# Patient Record
Sex: Male | Born: 1950 | Race: White | Hispanic: No | Marital: Single | Smoking: Never smoker
Health system: Southern US, Community
[De-identification: ages and names within clinical notes are randomized; demographics above are authoritative.]

## PROBLEM LIST (undated history)

## (undated) DIAGNOSIS — L97919 Non-pressure chronic ulcer of unspecified part of right lower leg with unspecified severity: Secondary | ICD-10-CM

## (undated) DIAGNOSIS — D509 Iron deficiency anemia, unspecified: Secondary | ICD-10-CM

## (undated) DIAGNOSIS — L03119 Cellulitis of unspecified part of limb: Secondary | ICD-10-CM

## (undated) DIAGNOSIS — I83019 Varicose veins of right lower extremity with ulcer of unspecified site: Secondary | ICD-10-CM

## (undated) DIAGNOSIS — D649 Anemia, unspecified: Secondary | ICD-10-CM

## (undated) DIAGNOSIS — I5042 Chronic combined systolic (congestive) and diastolic (congestive) heart failure: Secondary | ICD-10-CM

## (undated) DIAGNOSIS — I482 Chronic atrial fibrillation, unspecified: Secondary | ICD-10-CM

## (undated) DIAGNOSIS — K5909 Other constipation: Secondary | ICD-10-CM

## (undated) DIAGNOSIS — Z7409 Other reduced mobility: Secondary | ICD-10-CM

## (undated) DIAGNOSIS — A4902 Methicillin resistant Staphylococcus aureus infection, unspecified site: Secondary | ICD-10-CM

## (undated) DIAGNOSIS — G4733 Obstructive sleep apnea (adult) (pediatric): Secondary | ICD-10-CM

## (undated) DIAGNOSIS — R011 Cardiac murmur, unspecified: Secondary | ICD-10-CM

## (undated) DIAGNOSIS — N2889 Other specified disorders of kidney and ureter: Secondary | ICD-10-CM

## (undated) DIAGNOSIS — R001 Bradycardia, unspecified: Secondary | ICD-10-CM

## (undated) DIAGNOSIS — F32A Depression, unspecified: Secondary | ICD-10-CM

## (undated) DIAGNOSIS — I83029 Varicose veins of left lower extremity with ulcer of unspecified site: Secondary | ICD-10-CM

## (undated) DIAGNOSIS — M1990 Unspecified osteoarthritis, unspecified site: Secondary | ICD-10-CM

## (undated) DIAGNOSIS — T7840XA Allergy, unspecified, initial encounter: Secondary | ICD-10-CM

## (undated) DIAGNOSIS — G2581 Restless legs syndrome: Secondary | ICD-10-CM

## (undated) DIAGNOSIS — F419 Anxiety disorder, unspecified: Secondary | ICD-10-CM

## (undated) DIAGNOSIS — J309 Allergic rhinitis, unspecified: Secondary | ICD-10-CM

## (undated) DIAGNOSIS — N182 Chronic kidney disease, stage 2 (mild): Secondary | ICD-10-CM

## (undated) DIAGNOSIS — J45909 Unspecified asthma, uncomplicated: Secondary | ICD-10-CM

## (undated) DIAGNOSIS — L97929 Non-pressure chronic ulcer of unspecified part of left lower leg with unspecified severity: Secondary | ICD-10-CM

## (undated) DIAGNOSIS — N183 Chronic kidney disease, stage 3 unspecified: Secondary | ICD-10-CM

## (undated) DIAGNOSIS — I10 Essential (primary) hypertension: Secondary | ICD-10-CM

## (undated) DIAGNOSIS — E1129 Type 2 diabetes mellitus with other diabetic kidney complication: Secondary | ICD-10-CM

## (undated) DIAGNOSIS — E119 Type 2 diabetes mellitus without complications: Secondary | ICD-10-CM

## (undated) HISTORY — DX: Bradycardia, unspecified: R00.1

## (undated) HISTORY — DX: Essential (primary) hypertension: I10

## (undated) HISTORY — DX: Unspecified osteoarthritis, unspecified site: M19.90

## (undated) HISTORY — DX: Anemia, unspecified: D64.9

## (undated) HISTORY — DX: Depression, unspecified: F32.A

## (undated) HISTORY — DX: Anxiety disorder, unspecified: F41.9

## (undated) HISTORY — DX: Cellulitis of unspecified part of limb: L03.119

## (undated) HISTORY — DX: Restless legs syndrome: G25.81

## (undated) HISTORY — DX: Chronic kidney disease, stage 2 (mild): N18.2

## (undated) HISTORY — DX: Other reduced mobility: Z74.09

## (undated) HISTORY — DX: Other constipation: K59.09

## (undated) HISTORY — PX: TRANSTHORACIC ECHOCARDIOGRAM: SHX275

## (undated) HISTORY — DX: Cardiac murmur, unspecified: R01.1

## (undated) HISTORY — DX: Allergy, unspecified, initial encounter: T78.40XA

## (undated) HISTORY — DX: Chronic kidney disease, stage 3 unspecified: N18.30

## (undated) HISTORY — DX: Chronic atrial fibrillation, unspecified: I48.20

## (undated) HISTORY — DX: Varicose veins of left lower extremity with ulcer of unspecified site: L97.929

## (undated) HISTORY — DX: Other specified disorders of kidney and ureter: N28.89

## (undated) HISTORY — DX: Iron deficiency anemia, unspecified: D50.9

## (undated) HISTORY — DX: Allergic rhinitis, unspecified: J30.9

## (undated) HISTORY — DX: Chronic combined systolic (congestive) and diastolic (congestive) heart failure: I50.42

## (undated) HISTORY — DX: Type 2 diabetes mellitus without complications: E11.9

## (undated) HISTORY — DX: Varicose veins of right lower extremity with ulcer of unspecified site: I83.029

## (undated) HISTORY — DX: Obstructive sleep apnea (adult) (pediatric): G47.33

## (undated) HISTORY — DX: Type 2 diabetes mellitus with other diabetic kidney complication: E11.29

## (undated) HISTORY — PX: URETERAL STENT PLACEMENT: SHX822

## (undated) HISTORY — DX: Varicose veins of right lower extremity with ulcer of unspecified site: I83.019

## (undated) HISTORY — DX: Unspecified asthma, uncomplicated: J45.909

## (undated) HISTORY — DX: Varicose veins of right lower extremity with ulcer of unspecified site: L97.919

## (undated) HISTORY — DX: Methicillin resistant Staphylococcus aureus infection, unspecified site: A49.02

## (undated) HISTORY — DX: Morbid (severe) obesity due to excess calories: E66.01

## (undated) HISTORY — DX: Varicose veins of left lower extremity with ulcer of unspecified site: I83.019

## (undated) HISTORY — DX: Non-pressure chronic ulcer of unspecified part of right lower leg with unspecified severity: L97.919

---

## 2001-06-09 ENCOUNTER — Encounter: Admission: RE | Admit: 2001-06-09 | Discharge: 2001-09-07 | Payer: Self-pay | Admitting: Internal Medicine

## 2002-07-26 ENCOUNTER — Ambulatory Visit (HOSPITAL_COMMUNITY): Admission: RE | Admit: 2002-07-26 | Discharge: 2002-07-26 | Payer: Self-pay | Admitting: Internal Medicine

## 2002-07-26 ENCOUNTER — Encounter: Payer: Self-pay | Admitting: Internal Medicine

## 2002-08-02 ENCOUNTER — Ambulatory Visit (HOSPITAL_COMMUNITY): Admission: RE | Admit: 2002-08-02 | Discharge: 2002-08-02 | Payer: Self-pay | Admitting: Internal Medicine

## 2002-08-02 ENCOUNTER — Encounter: Payer: Self-pay | Admitting: Internal Medicine

## 2003-01-31 ENCOUNTER — Ambulatory Visit (HOSPITAL_COMMUNITY): Admission: RE | Admit: 2003-01-31 | Discharge: 2003-01-31 | Payer: Self-pay | Admitting: Internal Medicine

## 2003-01-31 ENCOUNTER — Encounter (INDEPENDENT_AMBULATORY_CARE_PROVIDER_SITE_OTHER): Payer: Self-pay | Admitting: Specialist

## 2004-12-24 ENCOUNTER — Ambulatory Visit: Payer: Self-pay | Admitting: Internal Medicine

## 2005-01-11 ENCOUNTER — Ambulatory Visit: Payer: Self-pay | Admitting: Internal Medicine

## 2005-01-28 ENCOUNTER — Ambulatory Visit: Payer: Self-pay | Admitting: Internal Medicine

## 2005-03-04 ENCOUNTER — Ambulatory Visit: Payer: Self-pay | Admitting: Internal Medicine

## 2005-03-18 ENCOUNTER — Ambulatory Visit: Payer: Self-pay | Admitting: Internal Medicine

## 2005-05-13 ENCOUNTER — Ambulatory Visit: Payer: Self-pay | Admitting: Internal Medicine

## 2005-05-20 ENCOUNTER — Encounter: Admission: RE | Admit: 2005-05-20 | Discharge: 2005-05-20 | Payer: Self-pay | Admitting: Internal Medicine

## 2005-08-12 ENCOUNTER — Ambulatory Visit: Payer: Self-pay | Admitting: Internal Medicine

## 2005-08-14 ENCOUNTER — Ambulatory Visit: Payer: Self-pay | Admitting: Internal Medicine

## 2005-08-19 ENCOUNTER — Ambulatory Visit: Payer: Self-pay

## 2005-08-26 ENCOUNTER — Ambulatory Visit: Payer: Self-pay | Admitting: Internal Medicine

## 2005-09-09 ENCOUNTER — Ambulatory Visit: Payer: Self-pay | Admitting: Internal Medicine

## 2005-11-11 ENCOUNTER — Ambulatory Visit: Payer: Self-pay | Admitting: Internal Medicine

## 2006-02-17 ENCOUNTER — Ambulatory Visit: Payer: Self-pay | Admitting: Internal Medicine

## 2006-03-10 ENCOUNTER — Ambulatory Visit: Payer: Self-pay | Admitting: Internal Medicine

## 2006-03-27 ENCOUNTER — Ambulatory Visit: Payer: Self-pay | Admitting: Internal Medicine

## 2006-04-07 ENCOUNTER — Ambulatory Visit: Payer: Self-pay | Admitting: Internal Medicine

## 2006-08-11 ENCOUNTER — Ambulatory Visit: Payer: Self-pay | Admitting: Internal Medicine

## 2006-08-11 ENCOUNTER — Encounter: Payer: Self-pay | Admitting: Internal Medicine

## 2006-08-11 DIAGNOSIS — J309 Allergic rhinitis, unspecified: Secondary | ICD-10-CM | POA: Insufficient documentation

## 2006-08-11 DIAGNOSIS — I10 Essential (primary) hypertension: Secondary | ICD-10-CM

## 2006-08-11 DIAGNOSIS — E119 Type 2 diabetes mellitus without complications: Secondary | ICD-10-CM

## 2006-08-11 DIAGNOSIS — J45909 Unspecified asthma, uncomplicated: Secondary | ICD-10-CM

## 2006-08-11 DIAGNOSIS — E1165 Type 2 diabetes mellitus with hyperglycemia: Secondary | ICD-10-CM | POA: Insufficient documentation

## 2006-08-11 HISTORY — DX: Unspecified asthma, uncomplicated: J45.909

## 2006-08-11 HISTORY — DX: Essential (primary) hypertension: I10

## 2006-08-11 HISTORY — DX: Type 2 diabetes mellitus without complications: E11.9

## 2006-08-11 HISTORY — DX: Allergic rhinitis, unspecified: J30.9

## 2006-12-08 ENCOUNTER — Ambulatory Visit: Payer: Self-pay | Admitting: Internal Medicine

## 2006-12-08 LAB — CONVERTED CEMR LAB
ALT: 17 units/L (ref 0–40)
AST: 18 units/L (ref 0–37)
Albumin: 3.6 g/dL (ref 3.5–5.2)
Alkaline Phosphatase: 42 units/L (ref 39–117)
BUN: 11 mg/dL (ref 6–23)
Bilirubin, Direct: 0.2 mg/dL (ref 0.0–0.3)
CO2: 32 meq/L (ref 19–32)
Calcium: 8.8 mg/dL (ref 8.4–10.5)
Chloride: 106 meq/L (ref 96–112)
Creatinine, Ser: 1.2 mg/dL (ref 0.4–1.5)
Creatinine,U: 238.9 mg/dL
GFR calc Af Amer: 81 mL/min
GFR calc non Af Amer: 67 mL/min
Glucose, Bld: 150 mg/dL — ABNORMAL HIGH (ref 70–99)
Hgb A1c MFr Bld: 6.2 % — ABNORMAL HIGH (ref 4.6–6.0)
Microalb Creat Ratio: 31 mg/g — ABNORMAL HIGH (ref 0.0–30.0)
Microalb, Ur: 7.4 mg/dL — ABNORMAL HIGH (ref 0.0–1.9)
Potassium: 4.9 meq/L (ref 3.5–5.1)
Sodium: 142 meq/L (ref 135–145)
Total Bilirubin: 0.7 mg/dL (ref 0.3–1.2)
Total Protein: 6.3 g/dL (ref 6.0–8.3)

## 2006-12-12 ENCOUNTER — Ambulatory Visit: Payer: Self-pay | Admitting: Internal Medicine

## 2007-04-20 ENCOUNTER — Ambulatory Visit: Payer: Self-pay | Admitting: Internal Medicine

## 2007-04-20 DIAGNOSIS — E1129 Type 2 diabetes mellitus with other diabetic kidney complication: Secondary | ICD-10-CM

## 2007-04-20 DIAGNOSIS — E1121 Type 2 diabetes mellitus with diabetic nephropathy: Secondary | ICD-10-CM

## 2007-04-20 DIAGNOSIS — N390 Urinary tract infection, site not specified: Secondary | ICD-10-CM

## 2007-04-20 HISTORY — DX: Type 2 diabetes mellitus with other diabetic kidney complication: E11.29

## 2007-04-20 LAB — CONVERTED CEMR LAB
ALT: 17 units/L (ref 0–40)
AST: 19 units/L (ref 0–37)
Albumin: 3.2 g/dL — ABNORMAL LOW (ref 3.5–5.2)
Alkaline Phosphatase: 56 units/L (ref 39–117)
BUN: 10 mg/dL (ref 6–23)
Bilirubin, Direct: 0.1 mg/dL (ref 0.0–0.3)
CO2: 32 meq/L (ref 19–32)
Calcium: 8.6 mg/dL (ref 8.4–10.5)
Chloride: 107 meq/L (ref 96–112)
Cholesterol: 127 mg/dL (ref 0–200)
Creatinine, Ser: 1 mg/dL (ref 0.4–1.5)
GFR calc Af Amer: 99 mL/min
GFR calc non Af Amer: 82 mL/min
Glucose, Bld: 170 mg/dL — ABNORMAL HIGH (ref 70–99)
HDL: 33 mg/dL — ABNORMAL LOW (ref >39.0)
Hgb A1c MFr Bld: 7.3 % — ABNORMAL HIGH (ref 4.6–6.0)
LDL Cholesterol: 77 mg/dL (ref 0–99)
Potassium: 4.4 meq/L (ref 3.5–5.1)
Sodium: 142 meq/L (ref 135–145)
Total Bilirubin: 1.1 mg/dL (ref 0.3–1.2)
Total CHOL/HDL Ratio: 3.8
Total Protein: 6.7 g/dL (ref 6.0–8.3)
Triglycerides: 83 mg/dL (ref 0–149)
VLDL: 17 mg/dL (ref 0–40)

## 2007-06-04 ENCOUNTER — Ambulatory Visit: Payer: Self-pay | Admitting: Internal Medicine

## 2007-07-22 ENCOUNTER — Ambulatory Visit: Payer: Self-pay | Admitting: Internal Medicine

## 2007-07-22 DIAGNOSIS — I872 Venous insufficiency (chronic) (peripheral): Secondary | ICD-10-CM

## 2007-07-28 ENCOUNTER — Ambulatory Visit: Payer: Self-pay | Admitting: Internal Medicine

## 2007-08-17 ENCOUNTER — Ambulatory Visit: Payer: Self-pay | Admitting: Internal Medicine

## 2007-08-18 LAB — CONVERTED CEMR LAB
ALT: 16 units/L (ref 0–53)
AST: 16 units/L (ref 0–37)
Albumin: 3.5 g/dL (ref 3.5–5.2)
Alkaline Phosphatase: 52 units/L (ref 39–117)
BUN: 11 mg/dL (ref 6–23)
Bilirubin, Direct: 0.2 mg/dL (ref 0.0–0.3)
CO2: 33 meq/L — ABNORMAL HIGH (ref 19–32)
Calcium: 8.8 mg/dL (ref 8.4–10.5)
Chloride: 105 meq/L (ref 96–112)
Cholesterol: 123 mg/dL (ref 0–200)
Creatinine, Ser: 1 mg/dL (ref 0.4–1.5)
GFR calc Af Amer: 99 mL/min
GFR calc non Af Amer: 82 mL/min
Glucose, Bld: 173 mg/dL — ABNORMAL HIGH (ref 70–99)
HDL: 27.1 mg/dL — ABNORMAL LOW (ref >39.0)
Hgb A1c MFr Bld: 7.3 % — ABNORMAL HIGH (ref 4.6–6.0)
LDL Cholesterol: 81 mg/dL (ref 0–99)
Potassium: 4.9 meq/L (ref 3.5–5.1)
Sodium: 145 meq/L (ref 135–145)
Total Bilirubin: 0.9 mg/dL (ref 0.3–1.2)
Total CHOL/HDL Ratio: 4.5
Total Protein: 6.2 g/dL (ref 6.0–8.3)
Triglycerides: 73 mg/dL (ref 0–149)
VLDL: 15 mg/dL (ref 0–40)

## 2007-11-09 ENCOUNTER — Ambulatory Visit: Payer: Self-pay | Admitting: Internal Medicine

## 2007-11-16 ENCOUNTER — Ambulatory Visit: Payer: Self-pay | Admitting: Cardiovascular Disease

## 2007-11-23 ENCOUNTER — Encounter: Payer: Self-pay | Admitting: Cardiovascular Disease

## 2007-11-23 ENCOUNTER — Ambulatory Visit: Payer: Self-pay

## 2007-12-14 ENCOUNTER — Ambulatory Visit: Payer: Self-pay | Admitting: Internal Medicine

## 2007-12-14 HISTORY — DX: Morbid (severe) obesity due to excess calories: E66.01

## 2007-12-15 LAB — CONVERTED CEMR LAB
ALT: 22 units/L (ref 0–53)
AST: 20 units/L (ref 0–37)
Albumin: 3.5 g/dL (ref 3.5–5.2)
Alkaline Phosphatase: 60 units/L (ref 39–117)
BUN: 15 mg/dL (ref 6–23)
Bilirubin, Direct: 0.2 mg/dL (ref 0.0–0.3)
CO2: 33 meq/L — ABNORMAL HIGH (ref 19–32)
Calcium: 8.8 mg/dL (ref 8.4–10.5)
Chloride: 97 meq/L (ref 96–112)
Cholesterol: 108 mg/dL (ref 0–200)
Creatinine, Ser: 1.2 mg/dL (ref 0.4–1.5)
GFR calc Af Amer: 81 mL/min
GFR calc non Af Amer: 67 mL/min
Glucose, Bld: 213 mg/dL — ABNORMAL HIGH (ref 70–99)
HDL: 27.8 mg/dL — ABNORMAL LOW (ref >39.0)
Hgb A1c MFr Bld: 8.1 % — ABNORMAL HIGH (ref 4.6–6.0)
LDL Cholesterol: 54 mg/dL (ref 0–99)
Potassium: 4.7 meq/L (ref 3.5–5.1)
Sodium: 139 meq/L (ref 135–145)
Total Bilirubin: 0.7 mg/dL (ref 0.3–1.2)
Total CHOL/HDL Ratio: 3.9
Total Protein: 6.2 g/dL (ref 6.0–8.3)
Triglycerides: 131 mg/dL (ref 0–149)
VLDL: 26 mg/dL (ref 0–40)

## 2007-12-22 ENCOUNTER — Ambulatory Visit: Payer: Self-pay | Admitting: Internal Medicine

## 2008-01-07 ENCOUNTER — Telehealth: Payer: Self-pay | Admitting: Internal Medicine

## 2008-01-25 ENCOUNTER — Ambulatory Visit: Payer: Self-pay | Admitting: Internal Medicine

## 2008-03-14 ENCOUNTER — Ambulatory Visit: Payer: Self-pay | Admitting: Internal Medicine

## 2008-03-14 LAB — CONVERTED CEMR LAB
ALT: 22 units/L (ref 0–53)
Bilirubin, Direct: 0.1 mg/dL (ref 0.0–0.3)
CO2: 33 meq/L — ABNORMAL HIGH (ref 19–32)
Calcium: 8.5 mg/dL (ref 8.4–10.5)
Creatinine, Ser: 1.1 mg/dL (ref 0.4–1.5)
GFR calc Af Amer: 89 mL/min
Glucose, Bld: 189 mg/dL — ABNORMAL HIGH (ref 70–99)
HDL: 28.9 mg/dL — ABNORMAL LOW (ref >39.0)
Hgb A1c MFr Bld: 7.8 % — ABNORMAL HIGH (ref 4.6–6.0)
Sodium: 142 meq/L (ref 135–145)
Total CHOL/HDL Ratio: 4.2
Total Protein: 6.5 g/dL (ref 6.0–8.3)
Triglycerides: 68 mg/dL (ref 0–149)
VLDL: 14 mg/dL (ref 0–40)

## 2008-03-22 ENCOUNTER — Ambulatory Visit: Payer: Self-pay | Admitting: Internal Medicine

## 2008-07-12 ENCOUNTER — Ambulatory Visit: Payer: Self-pay | Admitting: Internal Medicine

## 2008-07-12 LAB — CONVERTED CEMR LAB
ALT: 21 units/L (ref 0–53)
AST: 22 units/L (ref 0–37)
Albumin: 3.4 g/dL — ABNORMAL LOW (ref 3.5–5.2)
Alkaline Phosphatase: 42 units/L (ref 39–117)
BUN: 11 mg/dL (ref 6–23)
CO2: 31 meq/L (ref 19–32)
Chloride: 103 meq/L (ref 96–112)
Cholesterol: 101 mg/dL (ref 0–200)
Creatinine, Ser: 1.1 mg/dL (ref 0.4–1.5)
Glucose, Bld: 178 mg/dL — ABNORMAL HIGH (ref 70–99)
Hgb A1c MFr Bld: 7.5 % — ABNORMAL HIGH (ref 4.6–6.0)
Potassium: 4.2 meq/L (ref 3.5–5.1)
Total Protein: 6.3 g/dL (ref 6.0–8.3)
Triglycerides: 86 mg/dL (ref 0–149)

## 2008-07-19 ENCOUNTER — Ambulatory Visit: Payer: Self-pay | Admitting: Internal Medicine

## 2008-08-17 ENCOUNTER — Ambulatory Visit: Payer: Self-pay | Admitting: Internal Medicine

## 2008-11-04 DIAGNOSIS — I890 Lymphedema, not elsewhere classified: Secondary | ICD-10-CM | POA: Insufficient documentation

## 2008-11-14 ENCOUNTER — Ambulatory Visit: Payer: Self-pay | Admitting: Internal Medicine

## 2008-11-14 LAB — CONVERTED CEMR LAB
ALT: 21 units/L (ref 0–53)
AST: 24 units/L (ref 0–37)
Alkaline Phosphatase: 56 units/L (ref 39–117)
Bilirubin, Direct: 0.1 mg/dL (ref 0.0–0.3)
CO2: 32 meq/L (ref 19–32)
Chloride: 98 meq/L (ref 96–112)
Glucose, Bld: 238 mg/dL — ABNORMAL HIGH (ref 70–99)
Potassium: 4.5 meq/L (ref 3.5–5.1)
Sodium: 137 meq/L (ref 135–145)
Total Bilirubin: 1 mg/dL (ref 0.3–1.2)
Total CHOL/HDL Ratio: 3.7
Total Protein: 6.7 g/dL (ref 6.0–8.3)

## 2008-11-21 ENCOUNTER — Ambulatory Visit: Payer: Self-pay | Admitting: Internal Medicine

## 2008-12-26 ENCOUNTER — Telehealth: Payer: Self-pay | Admitting: Internal Medicine

## 2009-03-20 ENCOUNTER — Ambulatory Visit: Payer: Self-pay | Admitting: Internal Medicine

## 2009-03-20 LAB — CONVERTED CEMR LAB
BUN: 14 mg/dL (ref 6–23)
Bilirubin, Direct: 0.1 mg/dL (ref 0.0–0.3)
CO2: 31 meq/L (ref 19–32)
Chloride: 104 meq/L (ref 96–112)
Cholesterol: 126 mg/dL (ref 0–200)
Hgb A1c MFr Bld: 8.8 % — ABNORMAL HIGH (ref 4.6–6.5)
LDL Cholesterol: 65 mg/dL (ref 0–99)
Potassium: 4.2 meq/L (ref 3.5–5.1)
Total CHOL/HDL Ratio: 4
Total Protein: 6.7 g/dL (ref 6.0–8.3)
VLDL: 31.2 mg/dL (ref 0.0–40.0)

## 2009-04-10 ENCOUNTER — Ambulatory Visit: Payer: Self-pay | Admitting: Internal Medicine

## 2009-07-03 ENCOUNTER — Encounter: Payer: Self-pay | Admitting: Internal Medicine

## 2009-07-18 ENCOUNTER — Ambulatory Visit: Payer: Self-pay | Admitting: Family Medicine

## 2009-08-07 ENCOUNTER — Ambulatory Visit: Payer: Self-pay | Admitting: Internal Medicine

## 2009-08-07 LAB — CONVERTED CEMR LAB
ALT: 21 units/L (ref 0–53)
Chloride: 102 meq/L (ref 96–112)
Cholesterol: 122 mg/dL (ref 0–200)
GFR calc non Af Amer: 66.02 mL/min (ref >60)
HDL: 31.2 mg/dL — ABNORMAL LOW (ref >39.00)
Hgb A1c MFr Bld: 9 % — ABNORMAL HIGH (ref 4.6–6.5)
LDL Cholesterol: 65 mg/dL (ref 0–99)
Potassium: 4.2 meq/L (ref 3.5–5.1)
Sodium: 139 meq/L (ref 135–145)
Total Bilirubin: 0.9 mg/dL (ref 0.3–1.2)
Total Protein: 6.2 g/dL (ref 6.0–8.3)
VLDL: 25.4 mg/dL (ref 0.0–40.0)

## 2009-08-08 ENCOUNTER — Ambulatory Visit: Payer: Self-pay | Admitting: Internal Medicine

## 2009-11-13 ENCOUNTER — Ambulatory Visit: Payer: Self-pay | Admitting: Internal Medicine

## 2009-11-13 LAB — CONVERTED CEMR LAB
Alkaline Phosphatase: 58 units/L (ref 39–117)
BUN: 14 mg/dL (ref 6–23)
Bilirubin, Direct: 0.2 mg/dL (ref 0.0–0.3)
CO2: 30 meq/L (ref 19–32)
Chloride: 100 meq/L (ref 96–112)
Creatinine, Ser: 1.3 mg/dL (ref 0.4–1.5)
Glucose, Bld: 243 mg/dL — ABNORMAL HIGH (ref 70–99)

## 2009-11-20 ENCOUNTER — Ambulatory Visit: Payer: Self-pay | Admitting: Internal Medicine

## 2010-02-12 ENCOUNTER — Ambulatory Visit: Payer: Self-pay | Admitting: Internal Medicine

## 2010-02-12 LAB — CONVERTED CEMR LAB
AST: 23 units/L (ref 0–37)
Alkaline Phosphatase: 47 units/L (ref 39–117)
BUN: 11 mg/dL (ref 6–23)
GFR calc non Af Amer: 60.08 mL/min (ref >60)
Hgb A1c MFr Bld: 9 % — ABNORMAL HIGH (ref 4.6–6.5)
LDL Cholesterol: 63 mg/dL (ref 0–99)
Potassium: 4.1 meq/L (ref 3.5–5.1)
Sodium: 141 meq/L (ref 135–145)
Total Bilirubin: 0.9 mg/dL (ref 0.3–1.2)
Total CHOL/HDL Ratio: 4
VLDL: 30 mg/dL (ref 0.0–40.0)

## 2010-02-19 ENCOUNTER — Ambulatory Visit: Payer: Self-pay | Admitting: Internal Medicine

## 2010-05-21 ENCOUNTER — Ambulatory Visit: Payer: Self-pay | Admitting: Internal Medicine

## 2010-05-21 LAB — CONVERTED CEMR LAB
Albumin: 3.7 g/dL (ref 3.5–5.2)
Alkaline Phosphatase: 50 units/L (ref 39–117)
BUN: 14 mg/dL (ref 6–23)
Calcium: 8.8 mg/dL (ref 8.4–10.5)
GFR calc non Af Amer: 72.79 mL/min (ref >60)
Glucose, Bld: 203 mg/dL — ABNORMAL HIGH (ref 70–99)
HDL: 30.7 mg/dL — ABNORMAL LOW (ref >39.00)
Hgb A1c MFr Bld: 8.5 % — ABNORMAL HIGH (ref 4.6–6.5)
Sodium: 140 meq/L (ref 135–145)
VLDL: 38.6 mg/dL (ref 0.0–40.0)

## 2010-05-28 ENCOUNTER — Ambulatory Visit: Payer: Self-pay | Admitting: Internal Medicine

## 2010-09-10 ENCOUNTER — Ambulatory Visit: Payer: Self-pay | Admitting: Internal Medicine

## 2010-09-10 LAB — CONVERTED CEMR LAB
Albumin: 3.4 g/dL — ABNORMAL LOW (ref 3.5–5.2)
BUN: 12 mg/dL (ref 6–23)
CO2: 31 meq/L (ref 19–32)
Calcium: 9.1 mg/dL (ref 8.4–10.5)
Cholesterol: 117 mg/dL (ref 0–200)
Creatinine, Ser: 1.3 mg/dL (ref 0.4–1.5)
Glucose, Bld: 232 mg/dL — ABNORMAL HIGH (ref 70–99)
HDL: 30.9 mg/dL — ABNORMAL LOW (ref >39.00)
Total Protein: 6.1 g/dL (ref 6.0–8.3)
Triglycerides: 134 mg/dL (ref 0.0–149.0)

## 2010-09-17 ENCOUNTER — Ambulatory Visit: Payer: Self-pay | Admitting: Internal Medicine

## 2010-10-08 ENCOUNTER — Ambulatory Visit: Payer: Self-pay | Admitting: Internal Medicine

## 2010-12-06 NOTE — Assessment & Plan Note (Signed)
Summary: 3 MONTH ROV/NJR   Vital Signs:  Patient profile:   60 year old male Weight:      385 pounds Temp:     97.9 degrees F Pulse rate:   78 / minute Resp:     18 per minute BP sitting:   152 / 74  (left arm) Cuff size:   large  Vitals Entered By: Gladis Riffle, RN (November 20, 2009 8:22 AM)   Primary Care Provider:  Birdie Sons MD   History of Present Illness:  Follow-Up Visit      This is a 60 year old man who presents for Follow-up visit.  The patient denies chest pain, palpitations, dizziness, syncope, edema, SOB, DOE, PND, and orthopnea.  Since the last visit the patient notes no new problems or concerns.  The patient reports taking meds as prescribed.  When questioned about possible medication side effects, the patient notes none.  has tried to improve diet.   All other systems reviewed and were negative   Preventive Screening-Counseling & Management  Alcohol-Tobacco     Alcohol drinks/day: 0     Smoking Status: never  Allergies (verified): No Known Drug Allergies  Comments:  Nurse/Medical Assistant: 3 month rov, labs done--CBGs 175-235 at home  The patient's medications and allergies were reviewed with the patient and were updated in the Medication and Allergy Lists. 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  Past Surgical History: Last updated: 2006-08-30 uretheral stent  Family History: Last updated: 2006-08-30 Father deceased 65 yo Mother deceased 89 yo diabetes SisterDM Brother survived MI 31 yo Sister survived NPH  Social History: Last updated: 08/30/06 Occupation: Single Never Smoked Alcohol use-no Regular exercise-no  Risk Factors: Alcohol Use: 0 (11/20/2009) Exercise: no (07/18/2009)  Risk Factors: Smoking  Status: never (11/20/2009)  Review of Systems       All other systems reviewed and were negative   Physical Exam  General:  Well-developed,well-nourished,in no acute distress; alert,appropriate and cooperative throughout examination  All other systems reviewed and were negative  obese Head:  normocephalic and atraumatic.   Eyes:  pupils equal and pupils round.   Ears:  R ear normal and L ear normal.   Neck:  No deformities, masses, or tenderness noted. Chest Wall:  No deformities, masses, tenderness or gynecomastia noted. Lungs:  normal respiratory effort and no intercostal retractions.   Heart:  normal rate and regular rhythm.   Abdomen:  Bowel sounds positive,abdomen soft and non-tender without masses, organomegaly or hernias noted. morbid obesity Msk:  No deformity or scoliosis noted of thoracic or lumbar spine.   Pulses:  R radial normal and L radial normal.   Extremities:  3+ edema bilaterally without erythema Skin:  turgor normal and color normal.  Psych:  memory intact for recent and remote and good eye contact.     Impression & Recommendations:  Problem # 1:  OBESITY, MORBID (ICD-278.01) this is clearly his most significant medical issue  Problem # 2:  VENOUS INSUFFICIENCY, LEGS (ICD-459.81) this contributes to his edema---most is due to obesity  Problem # 3:  DIABETES MELLITUS, TYPE II (ICD-250.00)  His updated medication list for this problem includes:    Glipizide 5 Mg Tabs (Glipizide) .Marland Kitchen... 1 by mouth once daily    Metformin Hcl 1000 Mg Tabs (Metformin hcl) .Marland Kitchen... Take 1  tablet by mouth twice a day    Aspirin 325 Mg Tabs (Aspirin) ..... Once daily    Lisinopril-hydrochlorothiazide 20-12.5 Mg Tabs (Lisinopril-hydrochlorothiazide) .Marland Kitchen... 2 by mouth once daily    Lantus Solostar 100 Unit/ml Soln (Insulin glargine) .Marland KitchenMarland KitchenMarland KitchenMarland Kitchen 55 units subcutaneously daily  Complete Medication List: 1)  Glipizide 5 Mg Tabs (Glipizide) .Marland Kitchen.. 1 by mouth once daily 2)  Macrobid 100 Mg Caps  (Nitrofurantoin monohyd macro) .... Once weekly 3)  Metoprolol Tartrate 50 Mg Tabs (Metoprolol tartrate) .Marland Kitchen.. 1 by mouth twice daily 4)  Zyrtec 10 Mg Tabs (Cetirizine hcl) .... Once daily 5)  Hytrin 10 Mg Caps (Terazosin hcl) .Marland Kitchen.. 1 by mouth once daily 6)  Diltiazem Hcl Er Beads 360 Mg Cp24 (Diltiazem hcl er beads) .Marland Kitchen.. 1 o once daily 7)  Metformin Hcl 1000 Mg Tabs (Metformin hcl) .... Take 1 tablet by mouth twice a day 8)  Phrenilin 50-325 Mg Tabs (Butalbital-acetaminophen) .... As directed as needed 9)  Aspirin 325 Mg Tabs (Aspirin) .... Once daily 10)  Lisinopril-hydrochlorothiazide 20-12.5 Mg Tabs (Lisinopril-hydrochlorothiazide) .... 2 by mouth once daily 11)  Lantus Solostar 100 Unit/ml Soln (Insulin glargine) .... 55 units subcutaneously daily 12)  Bd U/f Original Pen Needle 29g X 12.73mm Misc (Insulin pen needle) .... Use with lantus once daily 13)  [facility] Tru Trac Test Strips  .... Once daily as directed  Patient Instructions: 1)  Please schedule a follow-up appointment in 3 months. 2)  labs one week prior to visit 3)  lipids---272.4 4)  lfts-995.2 5)  bmet-995.2 6)  A1C-250.02 7)  check your blood sugar every morning for 2 weeks. call me with results---based on results I may change lantus dose 8)     9)  It is important that you exercise regularly at least 20 minutes 5 times a week. If you develop chest pain, have severe difficulty breathing, or feel very tired , stop exercising immediately and seek medical attention. 10)  You need to lose weight. Consider a lower calorie diet and regular exercise.  11)  Check your blood sugars regularly. If your readings are usually above : or below 70 you should contact our office. 12)  It is important that your Diabetic A1c level is checked every 3 months. 13)  See your eye doctor yearly to check for diabetic eye damage.

## 2010-12-06 NOTE — Assessment & Plan Note (Signed)
Summary: 4 MONTH ROV/NJR   Vital Signs:  Patient profile:   60 year old male Weight:      385 pounds BMI:     55.44 Temp:     98.5 degrees F oral Pulse rate:   88 / minute Pulse rhythm:   regular BP sitting:   146 / 82  (left arm) Cuff size:   large  Vitals Entered By: Sydell Axon LPN (September 17, 2010 9:06 AM) CC: 4 Month follow-up, needs a refill on Macrobid   Primary Care Provider:  Birdie Sons MD  CC:  4 Month follow-up and needs a refill on Macrobid.  History of Present Illness:  Follow-Up Visit      This is a 60 year old man who presents for Follow-up visit.  The patient denies chest pain and palpitations.  Since the last visit the patient notes no new problems or concerns.  The patient reports taking meds as prescribed, not monitoring BP, not monitoring blood sugars, dietary noncompliance, and not exercising.  When questioned about possible medication side effects, the patient notes none.   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 1 By Mouth Once Daily 2)  Macrobid 100 Mg Caps (Nitrofurantoin Monohyd Macro) .... Once Weekly 3)  Metoprolol Tartrate 50 Mg Tabs (Metoprolol Tartrate) .Marland Kitchen.. 1 By Mouth Twice Daily 4)  Zyrtec 10 Mg Tabs (Cetirizine Hcl) .... Once Daily 5)  Terazosin Hcl 10 Mg Caps (Terazosin Hcl) .... Take 1 Tablet By Mouth Once A Day 6)  Diltiazem Hcl Er Beads 360 Mg Cp24 (Diltiazem Hcl Er Beads) .... Take 1 Capsule By Mouth Once A Day 7)  Metformin Hcl 1000 Mg Tabs (Metformin Hcl) ....  Take 1 Tablet By Mouth Twice A Day 8)  Phrenilin 50-325 Mg Tabs (Butalbital-Acetaminophen) .... As Directed As Needed 9)  Aspirin 325 Mg  Tabs (Aspirin) .... Once Daily 10)  Lisinopril-Hydrochlorothiazide 20-12.5 Mg  Tabs (Lisinopril-Hydrochlorothiazide) .... 2 By Mouth Once Daily 11)  Lantus Solostar 100 Unit/ml  Soln (Insulin Glargine) .... 65 Units Subcutaneously Daily 12)  Bd U/f Original Pen Needle 29g X 12.71mm  Misc (Insulin Pen Needle) .... Use With Lantus Once Daily 13)  [facility] Tru Trac Test Strips .... Once Daily As Directed  Allergies (verified): No Known Drug Allergies  Physical Exam  General:  morbidly obese male in no acute distress. HEENT exam atraumatic, normocephalic symmetric her muscles are intact. He is wearing glasses. Neck is supple without lymphadenopathy. Chest clear to auscultation without increased work of breathing. Abdominal exam obese, to bowel sounds, soft her extremities there is no clubbing or cyanosis. History post any edema to the midcalf. Neurologic exam is alert and oriented with a broad-based gait.   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 He also complains about the expense of Lantus. Will try and insulin 7030 mix. Side effects discussed. He needs to take insulin with meals now. He voices understanding. The following medications were removed from the medication list:    Lantus Solostar 100 Unit/ml  Soln (Insulin glargine) .Marland KitchenMarland KitchenMarland KitchenMarland Kitchen 65 units subcutaneously daily His updated medication list for this problem includes:    Glipizide 5 Mg Tabs (Glipizide) .Marland Kitchen... 1 by mouth once daily    Metformin Hcl 1000 Mg Tabs (Metformin hcl) .Marland Kitchen... Take 1 tablet by mouth twice a day    Aspirin 325 Mg Tabs (Aspirin) ..... Once daily    Lisinopril-hydrochlorothiazide 20-12.5 Mg Tabs (Lisinopril-hydrochlorothiazide) .Marland Kitchen... 2 by mouth once daily    Humulin 70/30 Pen 70-30 % Susp (Insulin isophane & regular) .Marland KitchenMarland KitchenMarland KitchenMarland Kitchen 30  units subcutaneously with breakfast and 25 units subcutaneously with dinner.  Labs Reviewed: Creat: 1.3 (09/10/2010)     Last Eye Exam: normal (07/03/2009) Reviewed HgBA1c results: 9.0 (09/10/2010)  8.5 (05/21/2010)  Problem # 2:  HYPERTENSION (ICD-401.9) reasonable control given his morbid obesity. His symptoms were clearly improved with aggressive weight loss. This was discussed with the patient. I don't think it's worth adding additional medications at this time. His updated medication list for this problem includes:    Metoprolol Tartrate 50 Mg Tabs (Metoprolol tartrate) .Marland Kitchen... 1 by mouth twice daily    Terazosin Hcl 10 Mg Caps (Terazosin hcl) .Marland Kitchen... Take 1 tablet by mouth once a day    Diltiazem Hcl Er Beads 360 Mg Cp24 (Diltiazem hcl er beads) .Marland Kitchen... Take 1 capsule by mouth once a day    Lisinopril-hydrochlorothiazide 20-12.5 Mg Tabs (Lisinopril-hydrochlorothiazide) .Marland Kitchen... 2 by mouth once daily  BP today: 146/82 Prior BP: 154/84 (05/28/2010)  Prior 10 Yr Risk Heart Disease: Not enough information (08/11/2006)  Labs Reviewed: K+: 5.1 (09/10/2010) Creat: : 1.3 (09/10/2010)   Chol: 117 (09/10/2010)   HDL: 30.90 (09/10/2010)   LDL: 59 (09/10/2010)   TG: 134.0 (09/10/2010)  Problem # 3:  URINARY TRACT INFECTION, ACUTE, RECURRENT (ICD-599.0) no recurrent symptoms. Long-term Macrobid use. Check chest x-ray. no recurrence after macrobid His updated medication list for this problem includes:    Macrobid 100 Mg Caps (Nitrofurantoin monohyd macro) ..... Once weekly  Orders: T-2 View CXR (71020TC)  Complete Medication List: 1)  Glipizide 5 Mg Tabs (Glipizide) .Marland Kitchen.. 1 by mouth once daily 2)  Macrobid 100 Mg Caps (Nitrofurantoin monohyd macro) .... Once weekly 3)  Metoprolol Tartrate 50 Mg Tabs (Metoprolol tartrate) .Marland Kitchen.. 1 by mouth twice daily 4)  Zyrtec 10 Mg Tabs (Cetirizine hcl) .... Once daily 5)  Terazosin Hcl 10 Mg Caps (Terazosin hcl) .... Take 1 tablet by mouth once a day 6)   Diltiazem Hcl Er Beads 360 Mg Cp24 (Diltiazem hcl er beads) .... Take 1 capsule by mouth once a day 7)  Metformin Hcl 1000 Mg Tabs (Metformin hcl) .... Take 1 tablet by mouth twice a day 8)  Phrenilin 50-325 Mg Tabs (Butalbital-acetaminophen) .... As directed as needed 9)  Aspirin 325 Mg Tabs (Aspirin) .... Once daily 10)  Lisinopril-hydrochlorothiazide 20-12.5 Mg Tabs (Lisinopril-hydrochlorothiazide) .... 2 by mouth once daily 11)  Bd U/f Original Pen Needle 29g X 12.39mm Misc (Insulin pen needle) .... Use with lantus once daily 12)  [facility] Tru Trac Test Strips  .... Once daily as directed 13)  Humulin 70/30 Pen 70-30 % Susp (Insulin isophane & regular) .... 30 units subcutaneously with breakfast and 25 units subcutaneously with dinner.  Other Orders: Admin 1st Vaccine (11914) Flu Vaccine 49yrs + (78295)  Patient Instructions: 1)  Please schedule a follow-up appointment in 3 months. 2)  labs one week prior to visit 3)  lipids---272.4 4)  lfts-995.2 5)  bmet-995.2 6)  A1C-250.02 7)     8)  It is important that you exercise regularly at least 20 minutes 5 times a week. If you develop chest pain, have severe difficulty breathing, or feel very tired , stop exercising immediately and seek medical attention. 9)  You need to lose weight. Consider a lower calorie diet and regular exercise.  Prescriptions: HUMULIN 70/30 PEN 70-30 % SUSP (INSULIN ISOPHANE & REGULAR) 30 units subcutaneously with breakfast and 25 units subcutaneously with dinner.  #5 pens x PRN   Entered and Authorized by:   Birdie Sons MD   Signed by:   Birdie Sons MD on 09/17/2010   Method used:   Electronically to        [facility]* (retail)       [address] [place], [place]  [address]       Ph: [phone]       Fax: [phone]   RxID:   [id number]    Orders Added: 1)  Admin 1st Vaccine [90471] 2)  Flu Vaccine 37yrs + [84132] 3)  Est. Patient Level IV [44010] 4)  T-2 View CXR [71020TC]    Current  Allergies (reviewed today): No known allergies   Flu Vaccine Consent Questions     Do you have a history of severe allergic reactions to this vaccine? no    Any prior history of allergic reactions to egg and/or gelatin? no    Do you have a sensitivity to the preservative Thimersol? no    Do you have a past history of Guillan-Barre Syndrome? no    Do you currently have an acute febrile illness? no    Have you ever had a severe reaction to latex? no    Vaccine information given and explained to patient? yes    Are you currently pregnant? no    Lot Number:[id number]   Exp Date:05/04/2011   Site Given  Left Deltoid IM   .lbflu

## 2010-12-06 NOTE — Assessment & Plan Note (Signed)
Summary: 3 month rov/njr   Vital Signs:  Patient profile:   60 year old male Pulse rate:   88 / minute Pulse rhythm:   regular Resp:     14 per minute BP sitting:   150 / 78  (left arm) Cuff size:   large  Vitals Entered By: Gladis Riffle, RN (February 19, 2010 8:03 AM) CC: 3 month rov, labs done--CBGs 197-298, average 230 at home Is Patient Diabetic? Yes Did you bring your meter with you today? No   Primary Care Provider:  Birdie Sons MD  CC:  3 month rov, labs done--CBGs 197-298, and average 230 at home.  History of Present Illness:  Follow-Up Visit      This is a 60 year old man who presents for Follow-up visit.  The patient denies chest pain and palpitations.  Since the last visit the patient notes no new problems or concerns.  The patient reports taking meds as prescribed, not monitoring BP, not monitoring blood sugars, dietary noncompliance, and not exercising.  When questioned about possible medication side effects, the patient notes none.    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 1 By Mouth Once Daily 2)  Macrobid 100 Mg Caps (Nitrofurantoin Monohyd Macro) .... Once Weekly 3)  Metoprolol Tartrate 50 Mg Tabs (Metoprolol Tartrate) .Marland Kitchen.. 1 By Mouth Twice Daily 4)  Zyrtec 10 Mg Tabs (Cetirizine Hcl) .... Once Daily 5)  Hytrin 10 Mg Caps (Terazosin Hcl) .Marland Kitchen.. 1 By Mouth Once Daily 6)  Diltiazem Hcl Er Beads 360 Mg Cp24  (Diltiazem Hcl Er Beads) .Marland Kitchen.. 1 O Once Daily 7)  Metformin Hcl 1000 Mg Tabs (Metformin Hcl) .... Take 1 Tablet By Mouth Twice A Day 8)  Phrenilin 50-325 Mg Tabs (Butalbital-Acetaminophen) .... As Directed As Needed 9)  Aspirin 325 Mg  Tabs (Aspirin) .... Once Daily 10)  Lisinopril-Hydrochlorothiazide 20-12.5 Mg  Tabs (Lisinopril-Hydrochlorothiazide) .... 2 By Mouth Once Daily 11)  Lantus Solostar 100 Unit/ml  Soln (Insulin Glargine) .... 55 Units Subcutaneously Daily 12)  Bd U/f Original Pen Needle 29g X 12.79mm  Misc (Insulin Pen Needle) .... Use With Lantus Once Daily 13)  [facility] Tru Trac Test Strips .... Once Daily As Directed  Allergies (verified): No Known Drug Allergies  Past History:  Past Medical History: Last updated: 04/10/2007 morbid obesity Allergic rhinitis Asthma Diabetes mellitus, type II Hypertension recurrent uti uretheral stent recurrent cellulitis right leg bronchospasm  Past Surgical History: Last updated: August 18, 2006 uretheral stent  Family History: Last updated: 08/18/2006 Father deceased 96 yo Mother deceased 60 yo diabetes SisterDM Brother survived MI 17 yo Sister survived NPH  Social History: Last updated: Aug 18, 2006 Occupation: Single Never Smoked Alcohol use-no Regular exercise-no  Risk Factors: Alcohol Use: 0 (02/19/2010) Exercise: no (07/18/2009)  Risk Factors: Smoking Status: never (02/19/2010)  Review of Systems       All other systems reviewed and were negative   Physical Exam  General:  Well-developed,well-nourished,in no acute distress; alert,appropriate  and cooperative throughout examination  All other systems reviewed and were negative  obese Head:  normocephalic and atraumatic.   Eyes:  pupils equal and pupils round.   Ears:  R ear normal and L ear normal.   Nose:  no external deformity and no external erythema.   Neck:  No deformities, masses, or tenderness noted. Chest Wall:  No deformities, masses, tenderness or  gynecomastia noted. Lungs:  normal respiratory effort and no intercostal retractions.   Abdomen:  Bowel sounds positive,abdomen soft and non-tender without masses, organomegaly or hernias noted. morbid obesity Msk:  No deformity or scoliosis noted of thoracic or lumbar spine.   Pulses:  R radial normal and L radial normal.   Neurologic:  cranial nerves II-XII intact and gait normal.     Impression & Recommendations:  Problem # 1:  OBESITY, MORBID (ICD-278.01)  this is clearly his most significant medical issue discussed weight loss options: he will consider but not interested at this time  Problem # 2:  DIABETES MELLITUS, TYPE II (ICD-250.00) not controlled increase lantus  His updated medication list for this problem includes:    Glipizide 5 Mg Tabs (Glipizide) .Marland Kitchen... 1 by mouth once daily    Metformin Hcl 1000 Mg Tabs (Metformin hcl) .Marland Kitchen... Take 1 tablet by mouth twice a day    Aspirin 325 Mg Tabs (Aspirin) ..... Once daily    Lisinopril-hydrochlorothiazide 20-12.5 Mg Tabs (Lisinopril-hydrochlorothiazide) .Marland Kitchen... 2 by mouth once daily    Lantus Solostar 100 Unit/ml Soln (Insulin glargine) .Marland KitchenMarland KitchenMarland KitchenMarland Kitchen 60 units subcutaneously daily  Labs Reviewed: Creat: 1.3 (02/12/2010)     Last Eye Exam: normal (07/03/2009) Reviewed HgBA1c results: 9.0 (02/12/2010)  9.0 (11/13/2009)  Problem # 3:  ASTHMA (ICD-493.90) no sxs   Problem # 4:  VENOUS INSUFFICIENCY, LEGS (ICD-459.81) related to obesity desperately needs to lose weight.   Problem # 5:  HYPERTENSION (ICD-401.9) related to weight continue current medications  His updated medication list for this problem includes:    Metoprolol Tartrate 50 Mg Tabs (Metoprolol tartrate) .Marland Kitchen... 1 by mouth twice daily    Hytrin 10 Mg Caps (Terazosin hcl) .Marland Kitchen... 1 by mouth once daily    Diltiazem Hcl Er Beads 360 Mg Cp24 (Diltiazem hcl er beads) .Marland Kitchen... 1 o once daily    Lisinopril-hydrochlorothiazide 20-12.5 Mg Tabs (Lisinopril-hydrochlorothiazide) .Marland Kitchen... 2 by  mouth once daily  BP today: 150/78 Prior BP: 152/74 (11/20/2009)  Prior 10 Yr Risk Heart Disease: Not enough information (08/11/2006)  Labs Reviewed: K+: 4.1 (02/12/2010) Creat: : 1.3 (02/12/2010)   Chol: 127 (02/12/2010)   HDL: 33.80 (02/12/2010)   LDL: 63 (02/12/2010)   TG: 150.0 (02/12/2010)  Complete Medication List: 1)  Glipizide 5 Mg Tabs (Glipizide) .Marland Kitchen.. 1 by mouth once daily 2)  Macrobid 100 Mg Caps (Nitrofurantoin monohyd macro) .... Once weekly 3)  Metoprolol Tartrate 50 Mg Tabs (Metoprolol tartrate) .Marland Kitchen.. 1 by mouth twice daily 4)  Zyrtec 10 Mg Tabs (Cetirizine hcl) .... Once daily 5)  Hytrin 10 Mg Caps (Terazosin hcl) .Marland Kitchen.. 1 by mouth once daily 6)  Diltiazem Hcl Er Beads 360 Mg Cp24 (Diltiazem hcl er beads) .Marland Kitchen.. 1 o once daily 7)  Metformin Hcl 1000 Mg Tabs (Metformin hcl) .... Take 1 tablet by mouth twice a day 8)  Phrenilin 50-325 Mg Tabs (Butalbital-acetaminophen) .... As directed as needed 9)  Aspirin 325 Mg Tabs (Aspirin) .... Once daily 10)  Lisinopril-hydrochlorothiazide 20-12.5 Mg Tabs (Lisinopril-hydrochlorothiazide) .... 2 by mouth once daily 11)  Lantus Solostar 100 Unit/ml Soln (Insulin  glargine) .... 60 units subcutaneously daily 12)  Bd U/f Original Pen Needle 29g X 12.17mm Misc (Insulin pen needle) .... Use with lantus once daily 13)  [facility] Tru Trac Test Strips  .... Once daily as directed  Patient Instructions: 1)  Please schedule a follow-up appointment in 3 months. 2)  labs one week prior to visit 3)  lipids---272.4 4)  lfts-995.2 5)  bmet-995.2 6)  A1C-250.02 7)

## 2010-12-06 NOTE — Assessment & Plan Note (Signed)
Summary: 3 month fup//ccm   Vital Signs:  Patient profile:   60 year old male Temp:     98.5 degrees F oral Pulse rate:   88 / minute Pulse rhythm:   regular BP sitting:   154 / 84  (left arm) Cuff size:   large  Vitals Entered By: Kern Reap CMA Duncan Dull) (May 28, 2010 8:12 AM)  Contraindications/Deferment of Procedures/Staging:    Test/Procedure: Weight Refused    Reason for deferment: patient declined-cannot calculate BMI  CC: follow-up visit    Primary Care Provider:  Birdie Sons MD  CC:  follow-up visit.  History of Present Illness:  Follow-Up Visit      This is a 60 year old man who presents for Follow-up visit.  The patient denies chest pain and palpitations.  Since the last visit the patient notes no new problems or concerns.  The patient reports taking meds as prescribed, not monitoring BP, not monitoring blood sugars, dietary noncompliance, and not exercising.  When questioned about possible medication side effects, the patient notes none.    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 1 By Mouth Once Daily 2)  Macrobid 100 Mg Caps (Nitrofurantoin Monohyd Macro) .... Once Weekly 3)  Metoprolol Tartrate 50 Mg Tabs (Metoprolol Tartrate) .Marland Kitchen.. 1 By Mouth Twice Daily 4)  Zyrtec 10 Mg Tabs (Cetirizine Hcl) .... Once Daily 5)  Hytrin 10 Mg Caps (Terazosin Hcl) .Marland Kitchen.. 1 By Mouth Once Daily 6)  Diltiazem Hcl Er Beads 360 Mg Cp24 (Diltiazem Hcl Er Beads) .... Take 1 Capsule By Mouth Once A Day 7)  Metformin Hcl 1000 Mg Tabs (Metformin Hcl)  .... Take 1 Tablet By Mouth Twice A Day 8)  Phrenilin 50-325 Mg Tabs (Butalbital-Acetaminophen) .... As Directed As Needed 9)  Aspirin 325 Mg  Tabs (Aspirin) .... Once Daily 10)  Lisinopril-Hydrochlorothiazide 20-12.5 Mg  Tabs (Lisinopril-Hydrochlorothiazide) .... 2 By Mouth Once Daily 11)  Lantus Solostar 100 Unit/ml  Soln (Insulin Glargine) .... 60 Units Subcutaneously Daily 12)  Bd U/f Original Pen Needle 29g X 12.83mm  Misc (Insulin Pen Needle) .... Use With Lantus Once Daily 13)  [facility] Tru Trac Test Strips .... Once Daily As Directed  Allergies (verified): No Known Drug Allergies  Past History:  Past Medical History: Last updated: 04/10/2007 morbid obesity Allergic rhinitis Asthma Diabetes mellitus, type II Hypertension recurrent uti uretheral stent recurrent cellulitis right leg bronchospasm  Past Surgical History: Last updated: Sep 04, 2006 uretheral stent  Family History: Last updated: 09-04-2006 Father deceased 13 yo Mother deceased 26 yo diabetes SisterDM Brother survived MI 43 yo Sister survived NPH  Social History: Last updated: 09/04/06 Occupation: Single Never Smoked Alcohol use-no Regular exercise-no  Risk Factors: Alcohol Use: 0 (02/19/2010) Exercise: no (07/18/2009)  Risk Factors: Smoking Status: never (02/19/2010)  Physical Exam  General:  alert and well-developed.   Head:  normocephalic and atraumatic.   Eyes:  pupils equal and pupils round.   Ears:  R ear normal and L ear normal.   Neck:  No deformities, masses, or tenderness noted. Chest Wall:  No deformities, masses,  tenderness or gynecomastia noted. Lungs:  normal respiratory effort and no intercostal retractions.   Heart:  normal rate and regular rhythm.   Abdomen:  Bowel sounds positive,abdomen soft and non-tender without masses, organomegaly or hernias noted. morbid obesity Msk:  No deformity or scoliosis noted of thoracic or lumbar spine.   Pulses:  R radial normal and L radial  normal.   Extremities:  3+ edema bilaterally without erythema Neurologic:  cranial nerves II-XII intact and gait normal.     Impression & Recommendations:  Problem # 1:  OBESITY, MORBID (ICD-278.01)  this is clearly his most significant medical issue discussed weight loss options: he will consider but not interested at this time  Problem # 2:  DIABETES MELLITUS, TYPE II (ICD-250.00) Assessment: Improved some better desperately needs to lose weight His updated medication list for this problem includes:    Glipizide 5 Mg Tabs (Glipizide) .Marland Kitchen... 1 by mouth once daily    Metformin Hcl 1000 Mg Tabs (Metformin hcl) .Marland Kitchen... Take 1 tablet by mouth twice a day    Aspirin 325 Mg Tabs (Aspirin) ..... Once daily    Lisinopril-hydrochlorothiazide 20-12.5 Mg Tabs (Lisinopril-hydrochlorothiazide) .Marland Kitchen... 2 by mouth once daily    Lantus Solostar 100 Unit/ml Soln (Insulin glargine) .Marland KitchenMarland KitchenMarland KitchenMarland Kitchen 65 units subcutaneously daily  Labs Reviewed: Creat: 1.1 (05/21/2010)     Last Eye Exam: normal (07/03/2009) Reviewed HgBA1c results: 8.5 (05/21/2010)  9.0 (02/12/2010)  Problem # 3:  HYPERTENSION (ICD-401.9) this is related to weight  he is on multiple high dose meds---continue same His updated medication list for this problem includes:    Metoprolol Tartrate 50 Mg Tabs (Metoprolol tartrate) .Marland Kitchen... 1 by mouth twice daily    Hytrin 10 Mg Caps (Terazosin hcl) .Marland Kitchen... 1 by mouth once daily    Diltiazem Hcl Er Beads 360 Mg Cp24 (Diltiazem hcl er beads) .Marland Kitchen... Take 1 capsule by mouth once a day    Lisinopril-hydrochlorothiazide 20-12.5 Mg Tabs (Lisinopril-hydrochlorothiazide) .Marland Kitchen... 2 by mouth once daily  BP today: 154/84 Prior BP: 150/78 (02/19/2010)  Prior 10 Yr Risk Heart Disease: Not enough information (08/11/2006)  Labs Reviewed: K+: 4.4 (05/21/2010) Creat: : 1.1 (05/21/2010)   Chol: 136 (05/21/2010)   HDL: 30.70 (05/21/2010)   LDL: 67 (05/21/2010)   TG: 193.0 (05/21/2010)  Problem # 4:  URINARY TRACT  INFECTION, ACUTE, RECURRENT (ICD-599.0) no recurrent sxs His updated medication list for this problem includes:    Macrobid 100 Mg Caps (Nitrofurantoin monohyd macro) ..... Once weekly  Complete Medication List: 1)  Glipizide 5 Mg Tabs (Glipizide) .Marland Kitchen.. 1 by mouth once daily 2)  Macrobid 100 Mg Caps (Nitrofurantoin monohyd macro) .... Once weekly 3)  Metoprolol Tartrate 50 Mg Tabs (Metoprolol tartrate) .Marland Kitchen.. 1 by mouth twice daily 4)  Zyrtec 10 Mg Tabs (Cetirizine hcl) .... Once daily 5)  Hytrin 10 Mg Caps (Terazosin hcl) .Marland Kitchen.. 1 by mouth once daily 6)  Diltiazem Hcl Er Beads 360 Mg Cp24 (Diltiazem hcl er beads) .... Take 1 capsule by mouth once a day 7)  Metformin Hcl 1000 Mg Tabs (Metformin hcl) .... Take 1 tablet by mouth twice a day 8)  Phrenilin 50-325 Mg Tabs (Butalbital-acetaminophen) .... As directed as needed 9)  Aspirin 325 Mg Tabs (Aspirin) .... Once daily 10)  Lisinopril-hydrochlorothiazide 20-12.5 Mg Tabs (Lisinopril-hydrochlorothiazide) .... 2 by mouth once daily 11)  Lantus Solostar 100 Unit/ml Soln (Insulin glargine) .... 65 units subcutaneously daily 12)  Bd U/f Original Pen Needle 29g X 12.57mm Misc (Insulin pen needle) .... Use with lantus  once daily 13)  [facility]vs Tru Trac Test Strips  .... Once daily as directed  Patient Instructions: 1)  Please schedule a follow-up appointment in 4 months. 2)  labs one week prior to visit 3)  lipids---272.4 4)  lfts-995.2 5)  bmet-995.2 6)  A1C-250.02 7)     Prescriptions: LANTUS SOLOSTAR 100 UNIT/ML  SOLN (INSULIN GLARGINE) 65 units subcutaneously daily  #18 pens x PRN   Entered and Authorized by:   Birdie Sons MD   Signed by:   Birdie Sons MD on 05/28/2010   Method used:   Electronically to        [facility]* (retail)       [address] [place], [place]  [address]       Ph: [phone]       Fax: [phone]   RxID:   [id number] BD U/F ORIGINAL PEN NEEDLE 29G X 12.7MM  MISC (INSULIN PEN NEEDLE) use with lantus  once daily  #100 x 2   Entered by:   Kern Reap CMA (AAMA)   Authorized by:   Birdie Sons MD   Signed by:   Kern Reap CMA (AAMA) on 05/28/2010   Method used:   Electronically to        [facility]* (retail)       [address] [place], [place]  [address]       Ph: [phone]       Fax: [phone]   RxID:   [id number]

## 2010-12-10 ENCOUNTER — Other Ambulatory Visit: Payer: BC Managed Care – PPO | Admitting: Internal Medicine

## 2010-12-10 DIAGNOSIS — T887XXA Unspecified adverse effect of drug or medicament, initial encounter: Secondary | ICD-10-CM

## 2010-12-10 DIAGNOSIS — E785 Hyperlipidemia, unspecified: Secondary | ICD-10-CM

## 2010-12-10 DIAGNOSIS — E119 Type 2 diabetes mellitus without complications: Secondary | ICD-10-CM

## 2010-12-10 LAB — BASIC METABOLIC PANEL
BUN: 16 mg/dL (ref 6–23)
CO2: 30 mEq/L (ref 19–32)
Chloride: 98 mEq/L (ref 96–112)
Glucose, Bld: 203 mg/dL — ABNORMAL HIGH (ref 70–99)
Potassium: 4.6 mEq/L (ref 3.5–5.1)

## 2010-12-10 LAB — HEPATIC FUNCTION PANEL
ALT: 21 U/L (ref 0–53)
Bilirubin, Direct: 0.2 mg/dL (ref 0.0–0.3)
Total Bilirubin: 0.7 mg/dL (ref 0.3–1.2)

## 2010-12-10 LAB — LIPID PANEL
LDL Cholesterol: 70 mg/dL (ref 0–99)
VLDL: 27 mg/dL (ref 0.0–40.0)

## 2010-12-14 ENCOUNTER — Encounter: Payer: Self-pay | Admitting: Internal Medicine

## 2010-12-17 ENCOUNTER — Encounter: Payer: Self-pay | Admitting: Internal Medicine

## 2010-12-17 ENCOUNTER — Ambulatory Visit (INDEPENDENT_AMBULATORY_CARE_PROVIDER_SITE_OTHER): Payer: BC Managed Care – PPO | Admitting: Internal Medicine

## 2010-12-17 VITALS — BP 144/84 | HR 104 | Temp 98.2°F | Ht 70.5 in

## 2010-12-17 DIAGNOSIS — E119 Type 2 diabetes mellitus without complications: Secondary | ICD-10-CM

## 2010-12-17 DIAGNOSIS — I10 Essential (primary) hypertension: Secondary | ICD-10-CM

## 2010-12-17 DIAGNOSIS — Z0000 Encounter for general adult medical examination without abnormal findings: Secondary | ICD-10-CM

## 2010-12-17 MED ORDER — INSULIN PEN NEEDLE 29G X 12MM MISC: Status: DC

## 2010-12-17 NOTE — Assessment & Plan Note (Signed)
This is the patient's primary problem. Discussed need for weight loss. Discussed low calorie diet and regular exercise.

## 2010-12-17 NOTE — Assessment & Plan Note (Signed)
Patient's main problem is morbid obesity. Reviewed his A1c with him. I think he needs more insulin. See new dose. Side effects discussed. Discussed hypoglycemia and its treatment.

## 2010-12-17 NOTE — Progress Notes (Signed)
  Subjective:    Patient ID: [name], male    DOB: 1950-11-08, 60 y.o.   MRN: [id number]  HPI   patient comes in for followup of multiple medical problems including type 2 diabetes, hyperlipidemia, hypertension. The patient does not check his one sugar blood pressure at home. The patetient does not follow an exercise or diet program. The patient denies any polyuria, polydipsia.  In the past the patient has gone to diabetic treatment center. The patient is tolerating medications  Without difficulty. The patient does admit to medication compliance.   Review of Systems  patient denies chest pain, shortness of breath, orthopnea. Denies lower extremity edema, abdominal pain, change in appetite, change in bowel movements. Patient denies rashes, musculoskeletal complaints. No other specific complaints in a complete review of systems.  Once in a while he will have word finding difficulties---no other memory concerns     Objective:   Physical Exam      Well-developed morbidly obese male in no acute distress. HEENT exam atraumatic, normocephalic, extraocular muscles are intact. Neck is supple. No jugular venous distention no thyromegaly. Chest clear to auscultation without increased work of breathing. Cardiac exam S1 and S2 are regular. Abdominal exam active bowel sounds, soft, nontender. Extremities no edema. Neurologic exam she is alert without any motor sensory deficits. Gait is normal.    Assessment & Plan:

## 2010-12-17 NOTE — Assessment & Plan Note (Signed)
Adequate control given morbid obesity. Discussed need for aggressive weight loss.

## 2010-12-27 ENCOUNTER — Telehealth: Payer: Self-pay | Admitting: Internal Medicine

## 2010-12-27 DIAGNOSIS — Z0000 Encounter for general adult medical examination without abnormal findings: Secondary | ICD-10-CM

## 2010-12-27 NOTE — Telephone Encounter (Signed)
ok 

## 2010-12-27 NOTE — Telephone Encounter (Signed)
Triage vm----was set up to have a colonoscopy. However, the patient found that the CT colonoscopy would be more of an advantage for him. Please call [facility] in [place] @ [phone], to authorize. Call pt @ [phone].

## 2010-12-31 NOTE — Telephone Encounter (Signed)
Orders sent to Terri.

## 2011-01-03 ENCOUNTER — Other Ambulatory Visit: Payer: Self-pay | Admitting: Internal Medicine

## 2011-01-03 DIAGNOSIS — Z1211 Encounter for screening for malignant neoplasm of colon: Secondary | ICD-10-CM

## 2011-01-03 HISTORY — PX: OTHER SURGICAL HISTORY: SHX169

## 2011-01-06 ENCOUNTER — Other Ambulatory Visit: Payer: Self-pay | Admitting: Internal Medicine

## 2011-01-22 ENCOUNTER — Ambulatory Visit
Admission: RE | Admit: 2011-01-22 | Discharge: 2011-01-22 | Disposition: A | Discharge status: Home / Self Care | Payer: BC Managed Care – PPO | Source: Ambulatory Visit | Attending: Internal Medicine | Admitting: Internal Medicine

## 2011-01-22 DIAGNOSIS — Z1211 Encounter for screening for malignant neoplasm of colon: Secondary | ICD-10-CM

## 2011-03-19 NOTE — Assessment & Plan Note (Signed)
[facility]                            CARDIOLOGY OFFICE NOTE   [name], [name]                   MRN:          [id number]  DATE:11/16/2007                            DOB:          09-13-1951    [name] is a morbidly obese 60 year old patient referred by Dr.  Cato Mulligan for dyspnea.   The patient has not had a previous cardiac workup.  The patient is over  400 pounds.  He has had a cold over the last week.  He saw Dr. Cato Mulligan on  Monday, he appears to have had a URI, he has not had a fever, he has  been having bit of a cough but no sputum production.  He is not an  asthmatic and does not smoke.   The dyspnea seems functional, he has mild chronic lower extremity edema  but is not on a diuretic.  He has not had any significant chest pain.  There has been no palpitations or syncope.   He is able to walk a short distance, maybe 100 feet, and then gets short  of breath.  This symptom is fairly chronic.  He is very sedentary.  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   There has been no history of DVT.  He had an ultrasound about a year ago  which was negative for DVT.   REVIEW OF SYSTEMS:  Otherwise negative.  His coronary risk factors  include morbid obesity, diabetes, his cholesterol is 77 not on therapy,  and hypertension.   His review of systems otherwise negative.   PAST MEDICAL HISTORY:  Fairly unremarkable.  He has had an urethral  stent, hypertension, lower extremity edema, morbid obesity.  He has not  had other surgery.   The patient is single.  He does not have children.  He works as an Engineer, mining.  He is sedentary.  He has poor dietary compliance and really  does not have a lot of hobbies.  His mother died at the age of 84 of  diabetes.  His father died at age 59 of natural causes.   CURRENT MEDICATIONS:  1. Metformin 1 gram b.i.d.  2. Glipizide 5 a day.  3. Macrobid.  4. Metoprolol 50 b.i.d.  5. Zyrtec 10 a day.  6. Hytrin 10 a day.  7. Lisinopril 40 a day.  8. Aspirin a day.   EXAM:  Remarkable for morbidly obese white male in no distress.  He has  striae along the skin and his shoulders.  Weight is 402, blood pressure  is 140/89, pulse 89, afebrile.  HEENT:  Unremarkable.  Carotids are without bruit; no lymphadenopathy,  thyromegaly, JVP elevation.  He has mild wheezing in the left upper lobe.  Diaphragmatic motion is  good.  S1-S2 with distant heart sounds, PMI not palpable.  ABDOMEN:  Protuberant, benign, bowel sounds positive, no tenderness, no  AAA, no hepatosplenomegaly or hepatojugular reflux.  Distal pulses are intact with +1 to 2 lower extremity edema bilaterally.   EKG is entirely normal.  Reviewed his lab work previously done by Dr. Cato Mulligan June of 2008,  potassium was 4.4, LFTs were normal, hemoglobin A1c is elevated at 7.3,  LDL cholesterol was 77, microalbumin was elevated at 7.4.   IMPRESSION:  1. Dyspnea related to upper respiratory infection and morbid obesity,      normal heart sounds and normal EKG.  I doubt significant pulmonary      hypertension or decreased left ventricular function.  Will check 2D      echocardiogram.  2. Lower extremity edema secondary to obesity, needs to be on a      diuretic.  Will stop his lisinopril and change over to lisinopril      hydrochlorothiazide, he will take two 20/12.5 mg tablet.  He will      get a potassium check with Dr. Cato Mulligan 3 weeks after starting this.      Continue low-salt diet.  3. Morbid obesity, information for both Optifast diet and [facility]      [facility]ariat[facility] will be given, encouraged to see this through.  4. Hypertension, currently borderline controlled.  Diuretic will help      with this.  5. Diabetes, needs tighter control.  Hemoglobin A1c should be in the 6      range, follow quarterly.  Follow-up with Dr. Cato Mulligan.  6. The patient will be seen on a  p.r.n. basis so long as his      echocardiogram shows good left ventricular function and no evidence      of pulmonary hypertension and he will follow up with Dr. Cato Mulligan for      his URI and further follow-up of his complications from morbid      obesity.     Noralyn Pick. Eden Emms, MD, [facility]  Electronically Signed    PCN/MedQ  DD: 11/16/2007  DT: 11/16/2007  Job #: [id number]   cc:   Valetta Mole. Swords, MD

## 2011-04-02 ENCOUNTER — Other Ambulatory Visit: Payer: Self-pay | Admitting: Internal Medicine

## 2011-04-11 ENCOUNTER — Other Ambulatory Visit: Payer: Self-pay | Admitting: Internal Medicine

## 2011-04-15 ENCOUNTER — Other Ambulatory Visit (INDEPENDENT_AMBULATORY_CARE_PROVIDER_SITE_OTHER): Payer: BC Managed Care – PPO

## 2011-04-15 DIAGNOSIS — T887XXA Unspecified adverse effect of drug or medicament, initial encounter: Secondary | ICD-10-CM

## 2011-04-15 DIAGNOSIS — E785 Hyperlipidemia, unspecified: Secondary | ICD-10-CM

## 2011-04-15 DIAGNOSIS — E119 Type 2 diabetes mellitus without complications: Secondary | ICD-10-CM

## 2011-04-15 LAB — LIPID PANEL
HDL: 35.4 mg/dL — ABNORMAL LOW (ref >39.00)
LDL Cholesterol: 88 mg/dL (ref 0–99)
Total CHOL/HDL Ratio: 4
Triglycerides: 112 mg/dL (ref 0.0–149.0)

## 2011-04-15 LAB — HEPATIC FUNCTION PANEL
ALT: 23 U/L (ref 0–53)
Albumin: 3.8 g/dL (ref 3.5–5.2)
Total Protein: 6.9 g/dL (ref 6.0–8.3)

## 2011-04-15 LAB — BASIC METABOLIC PANEL
BUN: 19 mg/dL (ref 6–23)
CO2: 28 mEq/L (ref 19–32)
GFR: 57.29 mL/min — ABNORMAL LOW (ref >60.00)
Glucose, Bld: 196 mg/dL — ABNORMAL HIGH (ref 70–99)
Potassium: 4.4 mEq/L (ref 3.5–5.1)

## 2011-04-22 ENCOUNTER — Encounter: Payer: Self-pay | Admitting: Internal Medicine

## 2011-04-22 ENCOUNTER — Ambulatory Visit (INDEPENDENT_AMBULATORY_CARE_PROVIDER_SITE_OTHER): Payer: BC Managed Care – PPO | Admitting: Internal Medicine

## 2011-04-22 DIAGNOSIS — Z2911 Encounter for prophylactic immunotherapy for respiratory syncytial virus (RSV): Secondary | ICD-10-CM

## 2011-04-22 DIAGNOSIS — I10 Essential (primary) hypertension: Secondary | ICD-10-CM

## 2011-04-22 DIAGNOSIS — Z23 Encounter for immunization: Secondary | ICD-10-CM

## 2011-04-22 DIAGNOSIS — E119 Type 2 diabetes mellitus without complications: Secondary | ICD-10-CM

## 2011-04-22 MED ORDER — INSULIN NPH ISOPHANE & REGULAR (70-30) 100 UNIT/ML ~~LOC~~ SUSP: SUBCUTANEOUS | Status: DC

## 2011-04-22 MED ORDER — GLIPIZIDE 5 MG PO TABS: 5.0000 mg | ORAL_TABLET | Freq: Every day | ORAL | Status: DC

## 2011-04-22 MED ORDER — METFORMIN HCL 1000 MG PO TABS: 1000.0000 mg | ORAL_TABLET | Freq: Two times a day (BID) | ORAL | Status: DC

## 2011-04-22 NOTE — Assessment & Plan Note (Signed)
fari control given weight

## 2011-04-22 NOTE — Assessment & Plan Note (Signed)
Better, not yet controlled Advised Increased exercise

## 2011-04-22 NOTE — Progress Notes (Signed)
  Subjective:    Patient ID: [name], male    DOB: [birth year], 60 y.o.   MRN: [id number]  HPI  patient comes in for followup of multiple medical problems including type 2 diabetes, hyperlipidemia, hypertension. The patient does not check blood sugar or blood pressure at home. The patetient does not follow an exercise or diet program. The patient denies any polyuria, polydipsia.  In the past the patient has gone to diabetic treatment center. The patient is tolerating medications  Without difficulty. The patient does admit to medication compliance.  Not exercising  Past Medical History  Diagnosis Date  . DIABETES MELLITUS, TYPE II 08/11/2006  . HYPERTENSION 08/11/2006  . ALLERGIC RHINITIS 08/11/2006  . ASTHMA 08/11/2006  . DM W/RENAL MANIFESTATIONS, TYPE II 04/20/2007  . OBESITY, MORBID 12/14/2007   Past Surgical History  Procedure Date  . Ureteral stent placement     reports that he has never smoked. He does not have any smokeless tobacco history on file. He reports that he does not drink alcohol or use illicit drugs. family history includes Diabetes in his mother and sister and Hydrocephalus in his sister. No Known Allergies    Review of Systems  patient denies chest pain, shortness of breath, orthopnea. Denies lower extremity edema, abdominal pain, change in appetite, change in bowel movements. Patient denies rashes, musculoskeletal complaints. No other specific complaints in a complete review of systems.      Objective:   Physical Exam  well-developed well-nourished male in no acute distress. HEENT exam atraumatic, normocephalic, neck supple without jugular venous distention. Chest clear to auscultation cardiac exam S1-S2 are regular. Abdominal exam overweight with bowel sounds, soft and nontender. Extremities 2+ lower extremity edema. Neurologic exam is alert with a normal gait.         Assessment & Plan:

## 2011-06-10 ENCOUNTER — Other Ambulatory Visit: Payer: Self-pay | Admitting: Internal Medicine

## 2011-07-03 ENCOUNTER — Other Ambulatory Visit: Payer: Self-pay | Admitting: Internal Medicine

## 2011-07-17 ENCOUNTER — Other Ambulatory Visit: Payer: Self-pay | Admitting: Internal Medicine

## 2011-08-12 ENCOUNTER — Other Ambulatory Visit (INDEPENDENT_AMBULATORY_CARE_PROVIDER_SITE_OTHER): Payer: BC Managed Care – PPO

## 2011-08-12 DIAGNOSIS — E119 Type 2 diabetes mellitus without complications: Secondary | ICD-10-CM

## 2011-08-12 DIAGNOSIS — I10 Essential (primary) hypertension: Secondary | ICD-10-CM

## 2011-08-12 LAB — BASIC METABOLIC PANEL
Calcium: 8.4 mg/dL (ref 8.4–10.5)
GFR: 60.86 mL/min (ref >60.00)
Glucose, Bld: 166 mg/dL — ABNORMAL HIGH (ref 70–99)
Sodium: 138 mEq/L (ref 135–145)

## 2011-08-12 LAB — HEPATIC FUNCTION PANEL
AST: 20 U/L (ref 0–37)
Albumin: 3.7 g/dL (ref 3.5–5.2)
Alkaline Phosphatase: 50 U/L (ref 39–117)

## 2011-08-12 LAB — LIPID PANEL
Total CHOL/HDL Ratio: 4
Triglycerides: 110 mg/dL (ref 0.0–149.0)

## 2011-08-19 ENCOUNTER — Ambulatory Visit: Payer: BC Managed Care – PPO | Admitting: Internal Medicine

## 2011-09-29 ENCOUNTER — Other Ambulatory Visit: Payer: Self-pay | Admitting: Internal Medicine

## 2011-10-25 ENCOUNTER — Other Ambulatory Visit: Payer: Self-pay | Admitting: Internal Medicine

## 2012-01-01 ENCOUNTER — Other Ambulatory Visit: Payer: Self-pay | Admitting: Internal Medicine

## 2012-01-31 ENCOUNTER — Other Ambulatory Visit: Payer: Self-pay | Admitting: Internal Medicine

## 2012-03-05 ENCOUNTER — Other Ambulatory Visit: Payer: Self-pay | Admitting: Internal Medicine

## 2012-03-24 ENCOUNTER — Other Ambulatory Visit: Payer: Self-pay | Admitting: Internal Medicine

## 2012-04-01 ENCOUNTER — Other Ambulatory Visit: Payer: Self-pay | Admitting: *Deleted

## 2012-04-01 MED ORDER — TERAZOSIN HCL 10 MG PO CAPS: 10.0000 mg | ORAL_CAPSULE | Freq: Every day | ORAL | Status: DC

## 2012-04-01 MED ORDER — LISINOPRIL-HYDROCHLOROTHIAZIDE 20-12.5 MG PO TABS: 2.0000 | ORAL_TABLET | Freq: Every day | ORAL | Status: DC

## 2012-04-13 ENCOUNTER — Ambulatory Visit (INDEPENDENT_AMBULATORY_CARE_PROVIDER_SITE_OTHER): Payer: Managed Care, Other (non HMO) | Admitting: Internal Medicine

## 2012-04-13 ENCOUNTER — Encounter: Payer: Self-pay | Admitting: Internal Medicine

## 2012-04-13 VITALS — BP 142/76 | HR 80 | Temp 98.1°F | Ht 70.0 in | Wt 389.0 lb

## 2012-04-13 DIAGNOSIS — R609 Edema, unspecified: Secondary | ICD-10-CM

## 2012-04-13 DIAGNOSIS — I10 Essential (primary) hypertension: Secondary | ICD-10-CM

## 2012-04-13 DIAGNOSIS — E119 Type 2 diabetes mellitus without complications: Secondary | ICD-10-CM

## 2012-04-13 DIAGNOSIS — R600 Localized edema: Secondary | ICD-10-CM

## 2012-04-13 LAB — HEPATIC FUNCTION PANEL
ALT: 21 U/L (ref 0–53)
AST: 23 U/L (ref 0–37)
Albumin: 3.5 g/dL (ref 3.5–5.2)
Alkaline Phosphatase: 47 U/L (ref 39–117)
Bilirubin, Direct: 0.1 mg/dL (ref 0.0–0.3)
Total Protein: 6.6 g/dL (ref 6.0–8.3)

## 2012-04-13 LAB — BASIC METABOLIC PANEL
BUN: 15 mg/dL (ref 6–23)
GFR: 63.58 mL/min (ref >60.00)
Glucose, Bld: 103 mg/dL — ABNORMAL HIGH (ref 70–99)
Potassium: 3.9 mEq/L (ref 3.5–5.1)

## 2012-04-13 LAB — LIPID PANEL
Cholesterol: 117 mg/dL (ref 0–200)
Triglycerides: 113 mg/dL (ref 0.0–149.0)

## 2012-04-13 MED ORDER — LISINOPRIL 40 MG PO TABS: 40.0000 mg | ORAL_TABLET | Freq: Every day | ORAL | Status: DC

## 2012-04-13 MED ORDER — FUROSEMIDE 20 MG PO TABS: 20.0000 mg | ORAL_TABLET | Freq: Every day | ORAL | Status: DC

## 2012-04-13 NOTE — Assessment & Plan Note (Addendum)
Lab Results  Component Value Date   HGBA1C 7.5* 08/12/2011   Not at goal 8 months ago, but better than it has becheck labs todayen

## 2012-04-13 NOTE — Progress Notes (Signed)
Patient ID: [name], male   DOB: 09/08/1951, 61 y.o.   MRN: [id number]  Subjective:    Patient ID: [name], male    DOB: 1951-02-07, 61 y.o.   MRN: [id number]  HPI   patient comes in for followup of multiple medical problems including type 2 diabetes, hyperlipidemia, hypertension. The patient does not check his one sugar blood pressure at home. The patetient does not follow an exercise or diet program. The patient denies any polyuria, polydipsia.  In the past the patient has gone to diabetic treatment center. The patient is tolerating medications  Without difficulty. The patient does admit to medication compliance.  Says cbgs are in the 140s now   Review of Systems  patient denies chest pain, shortness of breath, orthopnea. Denies lower extremity edema, abdominal pain, change in appetite, change in bowel movements. Patient denies rashes, musculoskeletal complaints. No other specific complaints in a complete review of systems.      Objective:   Physical Exam      Well-developed morbidly obese male in no acute distress. HEENT exam atraumatic, normocephalic, extraocular muscles are intact. Neck is supple. No jugular venous distention no thyromegaly. Chest clear to auscultation without increased work of breathing. Cardiac exam S1 and S2 are regular. Abdominal exam active bowel sounds, soft, nontender. Extremities marked lower extremity edema. Neurologic exam she is alert without any motor sensory deficits. Gait is normal.    Assessment & Plan:

## 2012-04-13 NOTE — Assessment & Plan Note (Signed)
Discussed  Probably needs furosemide

## 2012-04-13 NOTE — Assessment & Plan Note (Signed)
This continues to be his main problem Likely the cause of marked edema

## 2012-04-14 NOTE — Assessment & Plan Note (Signed)
BP Readings from Last 3 Encounters:  04/13/12 142/76  04/22/11 144/72  12/17/10 144/84   Reasonable control given morbid obesity

## 2012-05-13 ENCOUNTER — Other Ambulatory Visit: Payer: Self-pay | Admitting: *Deleted

## 2012-05-13 DIAGNOSIS — E119 Type 2 diabetes mellitus without complications: Secondary | ICD-10-CM

## 2012-05-13 MED ORDER — INSULIN PEN NEEDLE 29G X 12.7MM MISC: 1.0000 | Freq: Two times a day (BID) | Status: DC

## 2012-05-13 MED ORDER — INSULIN NPH ISOPHANE & REGULAR (70-30) 100 UNIT/ML ~~LOC~~ SUSP: SUBCUTANEOUS | Status: DC

## 2012-06-29 ENCOUNTER — Other Ambulatory Visit: Payer: Self-pay | Admitting: Internal Medicine

## 2012-07-05 ENCOUNTER — Other Ambulatory Visit: Payer: Self-pay | Admitting: Internal Medicine

## 2012-07-19 ENCOUNTER — Other Ambulatory Visit: Payer: Self-pay | Admitting: Internal Medicine

## 2012-08-26 ENCOUNTER — Other Ambulatory Visit: Payer: Self-pay | Admitting: Internal Medicine

## 2012-09-30 ENCOUNTER — Other Ambulatory Visit: Payer: Self-pay | Admitting: Internal Medicine

## 2012-10-12 ENCOUNTER — Ambulatory Visit (INDEPENDENT_AMBULATORY_CARE_PROVIDER_SITE_OTHER): Payer: Managed Care, Other (non HMO) | Admitting: Internal Medicine

## 2012-10-12 VITALS — BP 164/90 | HR 91 | Temp 97.9°F | Ht 70.5 in | Wt 390.0 lb

## 2012-10-12 DIAGNOSIS — R600 Localized edema: Secondary | ICD-10-CM

## 2012-10-12 DIAGNOSIS — I10 Essential (primary) hypertension: Secondary | ICD-10-CM

## 2012-10-12 DIAGNOSIS — R609 Edema, unspecified: Secondary | ICD-10-CM

## 2012-10-12 DIAGNOSIS — J45909 Unspecified asthma, uncomplicated: Secondary | ICD-10-CM

## 2012-10-12 DIAGNOSIS — E119 Type 2 diabetes mellitus without complications: Secondary | ICD-10-CM

## 2012-10-12 LAB — LIPID PANEL
HDL: 32.6 mg/dL — ABNORMAL LOW (ref >39.00)
Triglycerides: 80 mg/dL (ref 0.0–149.0)

## 2012-10-12 LAB — BASIC METABOLIC PANEL
BUN: 14 mg/dL (ref 6–23)
Calcium: 8.5 mg/dL (ref 8.4–10.5)
Creatinine, Ser: 1.3 mg/dL (ref 0.4–1.5)

## 2012-10-12 LAB — HEMOGLOBIN A1C: Hgb A1c MFr Bld: 7.4 % — ABNORMAL HIGH (ref 4.6–6.5)

## 2012-10-12 LAB — HEPATIC FUNCTION PANEL: Albumin: 3.4 g/dL — ABNORMAL LOW (ref 3.5–5.2)

## 2012-10-12 MED ORDER — FUROSEMIDE 20 MG PO TABS: 20.0000 mg | ORAL_TABLET | Freq: Every day | ORAL | Status: DC

## 2012-10-12 NOTE — Assessment & Plan Note (Signed)
This is his most significant problem He is not interested in weight loss surgery He understands that his obesity will likely kill him.

## 2012-10-12 NOTE — Assessment & Plan Note (Signed)
On multiple drugs No home BPs He will concentrate on aggressive weight loss

## 2012-10-12 NOTE — Progress Notes (Signed)
Patient ID: [name], male   DOB: [birth year], 61 y.o.   MRN: [id number]  patient comes in for followup of multiple medical problems including type 2 diabetes, hyperlipidemia, hypertension. The patient does not check blood sugar or blood pressure at home. The patetient does not follow an exercise or diet program. The patient denies any polyuria, polydipsia.  In the past the patient has gone to diabetic treatment center. The patient is tolerating medications  Without difficulty. The patient does admit to medication compliance.   Past Medical History  Diagnosis Date  . DIABETES MELLITUS, TYPE II 08/11/2006  . HYPERTENSION 08/11/2006  . ALLERGIC RHINITIS 08/11/2006  . ASTHMA 08/11/2006  . DM W/RENAL MANIFESTATIONS, TYPE II 04/20/2007  . OBESITY, MORBID 12/14/2007    History   Social History  . Marital Status: Single    Spouse Name: N/A    Number of Children: N/A  . Years of Education: N/A   Occupational History  . Not on file.   Social History Main Topics  . Smoking status: Never Smoker   . Smokeless tobacco: Not on file  . Alcohol Use: No  . Drug Use: No  . Sexually Active:    Other Topics Concern  . Not on file   Social History Narrative  . No narrative on file    Past Surgical History  Procedure Date  . Ureteral stent placement     Family History  Problem Relation Age of Onset  . Diabetes Mother   . Diabetes Sister   . Hydrocephalus Sister     NPH    No Known Allergies  Current Outpatient Prescriptions on File Prior to Visit  Medication Sig Dispense Refill  . ACETAMINOPHEN-BUTALBITAL 50-325 MG TABS TAKE AS DIRECTED AS NEEDED  30 each  5  . aspirin 325 MG tablet Take 325 mg by mouth daily.        Marland Kitchen glipiZIDE (GLUCOTROL) 5 MG tablet TAKE ONE TABLET BY MOUTH EVERY DAY  90 tablet  1  . insulin NPH-insulin regular (NOVOLIN 70/30) (70-30) 100 UNIT/ML injection 35 units subq with breakfast and 30 units subq with dinner  30 mL  5  . Insulin Pen Needle (BD ULTRA-FINE  PEN NEEDLES) 29G X 12.7MM MISC 1 each by Other route 2 (two) times daily.  200 each  2  . lisinopril (PRINIVIL,ZESTRIL) 40 MG tablet Take 1 tablet (40 mg total) by mouth daily.  90 tablet  3  . metFORMIN (GLUCOPHAGE) 1000 MG tablet TAKE ONE TABLET BY MOUTH TWICE DAILY WITH MEALS  180 tablet  1  . metoprolol (LOPRESSOR) 50 MG tablet TAKE 1 TABLET BY MOUTH TWICE DAILY  180 tablet  4  . nitrofurantoin, macrocrystal-monohydrate, (MACROBID) 100 MG capsule TAKE 1 CAPSULE BY MOUTH ONCE WEEKLY  30 capsule  0  . TAZTIA XT 360 MG 24 hr capsule TAKE 1 TABLET BY MOUTH EVERY DAY  90 capsule  2  . terazosin (HYTRIN) 10 MG capsule TAKE ONE CAPSULE BY MOUTH EVERY DAY AT BEDTIME  90 capsule  0  . [DISCONTINUED] furosemide (LASIX) 20 MG tablet Take 1 tablet (20 mg total) by mouth daily.  30 tablet  3     patient denies chest pain, shortness of breath, orthopnea. Denies lower extremity edema, abdominal pain, change in appetite, change in bowel movements. Patient denies rashes, musculoskeletal complaints. No other specific complaints in a complete review of systems.   BP 164/90  Pulse 91  Temp 97.9 F (36.6 C) (Oral)  Ht 5'  10.5" (1.791 m)  Wt 390 lb (176.903 kg)  BMI 55.17 kg/m2  well-developed well-nourished male in no acute distress. HEENT exam atraumatic, normocephalic, neck supple without jugular venous distention. Chest clear to auscultation cardiac exam S1-S2 are regular. Abdominal exam overweight with bowel sounds, soft and nontender. Extremities no edema. Neurologic exam is alert with a normal gait.

## 2012-10-14 NOTE — Assessment & Plan Note (Signed)
Reasonable control Needs to lose weight desperately  Discussed possibility of weight loss surgery- he is not interested

## 2012-10-14 NOTE — Assessment & Plan Note (Signed)
Has marked LE edema Continue stockings He needs to lose weight

## 2012-12-02 ENCOUNTER — Other Ambulatory Visit: Payer: Self-pay | Admitting: Internal Medicine

## 2012-12-25 ENCOUNTER — Other Ambulatory Visit: Payer: Self-pay | Admitting: Internal Medicine

## 2013-01-04 ENCOUNTER — Other Ambulatory Visit: Payer: Self-pay | Admitting: Internal Medicine

## 2013-01-17 ENCOUNTER — Other Ambulatory Visit: Payer: Self-pay | Admitting: Internal Medicine

## 2013-01-25 ENCOUNTER — Other Ambulatory Visit: Payer: Self-pay | Admitting: Internal Medicine

## 2013-02-17 ENCOUNTER — Ambulatory Visit (INDEPENDENT_AMBULATORY_CARE_PROVIDER_SITE_OTHER): Payer: Managed Care, Other (non HMO) | Admitting: Family

## 2013-02-17 ENCOUNTER — Other Ambulatory Visit: Payer: Self-pay | Admitting: Internal Medicine

## 2013-02-17 ENCOUNTER — Encounter: Payer: Self-pay | Admitting: Family

## 2013-02-17 VITALS — BP 158/72 | HR 104 | Wt 398.3 lb

## 2013-02-17 DIAGNOSIS — E119 Type 2 diabetes mellitus without complications: Secondary | ICD-10-CM

## 2013-02-17 DIAGNOSIS — I10 Essential (primary) hypertension: Secondary | ICD-10-CM

## 2013-02-17 DIAGNOSIS — F410 Panic disorder [episodic paroxysmal anxiety] without agoraphobia: Secondary | ICD-10-CM

## 2013-02-17 MED ORDER — PAROXETINE HCL 20 MG PO TABS: 20.0000 mg | ORAL_TABLET | ORAL | Status: DC

## 2013-02-17 MED ORDER — CLONAZEPAM 1 MG PO TABS: 1.0000 mg | ORAL_TABLET | Freq: Two times a day (BID) | ORAL | Status: DC | PRN

## 2013-02-17 NOTE — Patient Instructions (Signed)
Anxiety and Panic Attacks Your caregiver has informed you that you are having an anxiety or panic attack. There may be many forms of this. Most of the time these attacks come suddenly and without warning. They come at any time of day, including periods of sleep, and at any time of life. They may be strong and unexplained. Although panic attacks are very scary, they are physically harmless. Sometimes the cause of your anxiety is not known. Anxiety is a protective mechanism of the body in its fight or flight mechanism. Most of these perceived danger situations are actually nonphysical situations (such as anxiety over losing a job). CAUSES  The causes of an anxiety or panic attack are many. Panic attacks may occur in otherwise healthy people given a certain set of circumstances. There may be a genetic cause for panic attacks. Some medications may also have anxiety as a side effect. SYMPTOMS  Some of the most common feelings are:  Intense terror.  Dizziness, feeling faint.  Hot and cold flashes.  Fear of going crazy.  Feelings that nothing is real.  Sweating.  Shaking.  Chest pain or a fast heartbeat (palpitations).  Smothering, choking sensations.  Feelings of impending doom and that death is near.  Tingling of extremities, this may be from over-breathing.  Altered reality (derealization).  Being detached from yourself (depersonalization). Several symptoms can be present to make up anxiety or panic attacks. DIAGNOSIS  The evaluation by your caregiver will depend on the type of symptoms you are experiencing. The diagnosis of anxiety or panic attack is made when no physical illness can be determined to be a cause of the symptoms. TREATMENT  Treatment to prevent anxiety and panic attacks may include:  Avoidance of circumstances that cause anxiety.  Reassurance and relaxation.  Regular exercise.  Relaxation therapies, such as yoga.  Psychotherapy with a psychiatrist or  therapist.  Avoidance of caffeine, alcohol and illegal drugs.  Prescribed medication. SEEK IMMEDIATE MEDICAL CARE IF:   You experience panic attack symptoms that are different than your usual symptoms.  You have any worsening or concerning symptoms. Document Released: 10/21/2005 Document Revised: 01/13/2012 Document Reviewed: 02/22/2010 [employer] Patient Information 2013 [employer].  

## 2013-02-17 NOTE — Progress Notes (Signed)
Subjective:    Patient ID: [name], male    DOB: 16-Feb-1951, 62 y.o.   MRN: [id number]  HPI  62 year old white male, obese, nonsmoker, patient of Dr. Cato Mulligan is in today with complaints of increased worry and anxiety over the last week. Patient reports having more stress recently related to his finances. Reports his building [employer] on time but has not had the flexibility that he once had financially. Has periods of feeling doomed, helplessness, hopelessness, but no thoughts of suicide. Patient reports an acute episode last week of being afraid to go outside. Reports increased fear. He has never had symptoms like this before. Denies depression.   Review of Systems  Constitutional: Negative.   Respiratory: Negative.   Cardiovascular: Negative.   Musculoskeletal: Negative.   Skin: Negative.   Allergic/Immunologic: Negative.   Neurological: Negative.   Hematological: Negative.   Psychiatric/Behavioral: Positive for sleep disturbance. Negative for confusion and agitation. The patient is nervous/anxious.    Past Medical History  Diagnosis Date  . DIABETES MELLITUS, TYPE II 08/11/2006  . HYPERTENSION 08/11/2006  . ALLERGIC RHINITIS 08/11/2006  . ASTHMA 08/11/2006  . DM W/RENAL MANIFESTATIONS, TYPE II 04/20/2007  . OBESITY, MORBID 12/14/2007    History   Social History  . Marital Status: Single    Spouse Name: N/A    Number of Children: N/A  . Years of Education: N/A   Occupational History  . Not on file.   Social History Main Topics  . Smoking status: Never Smoker   . Smokeless tobacco: Not on file  . Alcohol Use: No  . Drug Use: No  . Sexually Active:    Other Topics Concern  . Not on file   Social History Narrative  . No narrative on file    Past Surgical History  Procedure Laterality Date  . Ureteral stent placement      Family History  Problem Relation Age of Onset  . Diabetes Mother   . Diabetes Sister   . Hydrocephalus Sister     NPH    No Known  Allergies  Current Outpatient Prescriptions on File Prior to Visit  Medication Sig Dispense Refill  . ACETAMINOPHEN-BUTALBITAL 50-325 MG TABS TAKE AS DIRECTED AS NEEDED  30 each  5  . furosemide (LASIX) 20 MG tablet Take 1 tablet (20 mg total) by mouth daily.  90 tablet  3  . glipiZIDE (GLUCOTROL) 5 MG tablet TAKE ONE TABLET BY MOUTH EVERY DAY  90 tablet  0  . HUMULIN 70/30 PEN (70-30) 100 UNIT/ML injection INJECT 35 UNITS SUBCUTANEOUSLY EVERY DAY WITH BREAKFAST AND 30 UNITS DAILY WITH DINNER  15 mL  0  . Insulin Pen Needle (BD ULTRA-FINE PEN NEEDLES) 29G X 12.7MM MISC 1 each by Other route 2 (two) times daily.  200 each  2  . lisinopril (PRINIVIL,ZESTRIL) 40 MG tablet Take 1 tablet (40 mg total) by mouth daily.  90 tablet  3  . metFORMIN (GLUCOPHAGE) 1000 MG tablet TAKE ONE TABLET BY MOUTH TWICE DAILY WITH  MEALS  180 tablet  0  . metoprolol (LOPRESSOR) 50 MG tablet TAKE ONE TABLET BY MOUTH TWICE DAILY  180 tablet  0  . TAZTIA XT 360 MG 24 hr capsule TAKE AS DIRECTED  90 capsule  0  . terazosin (HYTRIN) 10 MG capsule TAKE ONE CAPSULE BY MOUTH EVERY DAY AT BEDTIME  90 capsule  0  . aspirin 325 MG tablet Take 325 mg by mouth daily.        Marland Kitchen  nitrofurantoin, macrocrystal-monohydrate, (MACROBID) 100 MG capsule TAKE 1 CAPSULE BY MOUTH ONCE WEEKLY  30 capsule  0   No current facility-administered medications on file prior to visit.    BP 158/72  Pulse 104  Wt 398 lb 4.8 oz (180.668 kg)  BMI 56.32 kg/m2  SpO2 95%chart    Objective:   Physical Exam  Constitutional: He is oriented to person, place, and time. He appears well-developed and well-nourished.  Neck: Normal range of motion. Neck supple.  Cardiovascular: Normal rate, regular rhythm and normal heart sounds.   Pulmonary/Chest: Effort normal and breath sounds normal.  Musculoskeletal: Normal range of motion.  Neurological: He is alert and oriented to person, place, and time. He has normal reflexes.  Skin: Skin is warm and dry.   Psychiatric: He has a normal mood and affect.          Assessment & Plan:  Assessment:  1. Obesity 2. Anxiety 3. Panic attacks   Plan; start Paxil 20 mg once daily. As needed Klonopin 1 mg one half to one tablet as needed for acute anxiety until we stabilize the Paxil. Patient to call the office with any questions or concerns. Recheck in 3 weeks and sooner as needed.

## 2013-02-23 ENCOUNTER — Encounter: Payer: Self-pay | Admitting: Internal Medicine

## 2013-02-26 ENCOUNTER — Encounter: Payer: Self-pay | Admitting: Internal Medicine

## 2013-02-27 ENCOUNTER — Other Ambulatory Visit: Payer: Self-pay | Admitting: Internal Medicine

## 2013-02-28 ENCOUNTER — Other Ambulatory Visit: Payer: Self-pay | Admitting: Internal Medicine

## 2013-03-14 ENCOUNTER — Other Ambulatory Visit: Payer: Self-pay | Admitting: Internal Medicine

## 2013-03-15 ENCOUNTER — Ambulatory Visit: Payer: Managed Care, Other (non HMO) | Admitting: Family Medicine

## 2013-03-21 ENCOUNTER — Other Ambulatory Visit: Payer: Self-pay | Admitting: Internal Medicine

## 2013-03-30 ENCOUNTER — Encounter: Payer: Self-pay | Admitting: Family Medicine

## 2013-03-30 ENCOUNTER — Ambulatory Visit (INDEPENDENT_AMBULATORY_CARE_PROVIDER_SITE_OTHER): Payer: Managed Care, Other (non HMO) | Admitting: Family Medicine

## 2013-03-30 VITALS — BP 146/70 | HR 80 | Temp 97.9°F | Resp 18 | Ht 68.25 in | Wt 378.5 lb

## 2013-03-30 DIAGNOSIS — N058 Unspecified nephritic syndrome with other morphologic changes: Secondary | ICD-10-CM

## 2013-03-30 DIAGNOSIS — E119 Type 2 diabetes mellitus without complications: Secondary | ICD-10-CM

## 2013-03-30 DIAGNOSIS — I10 Essential (primary) hypertension: Secondary | ICD-10-CM

## 2013-03-30 DIAGNOSIS — IMO0001 Reserved for inherently not codable concepts without codable children: Secondary | ICD-10-CM

## 2013-03-30 DIAGNOSIS — E1129 Type 2 diabetes mellitus with other diabetic kidney complication: Secondary | ICD-10-CM

## 2013-03-30 DIAGNOSIS — E1121 Type 2 diabetes mellitus with diabetic nephropathy: Secondary | ICD-10-CM

## 2013-03-30 LAB — BASIC METABOLIC PANEL
CO2: 27 mEq/L (ref 19–32)
Calcium: 8.5 mg/dL (ref 8.4–10.5)
Chloride: 102 mEq/L (ref 96–112)
Creatinine, Ser: 1.2 mg/dL (ref 0.4–1.5)
Sodium: 138 mEq/L (ref 135–145)

## 2013-03-30 LAB — MICROALBUMIN / CREATININE URINE RATIO
Creatinine,U: 287.8 mg/dL
Microalb Creat Ratio: 3.8 mg/g (ref 0.0–30.0)
Microalb, Ur: 11 mg/dL — ABNORMAL HIGH (ref 0.0–1.9)

## 2013-03-30 NOTE — Assessment & Plan Note (Addendum)
Sounds like he is doing self titration of his insulin appropriately. However, he has no interest in diet or exercise and understands that this is the mainstay of treatment for his DM, HTN, and his obesity. Check HbA1c and urine microalb/cr today.

## 2013-03-30 NOTE — Progress Notes (Signed)
OFFICE NOTE  03/30/2013  CC:  Chief Complaint  Patient presents with  . Establish Care    NP to establish [Transfer from Dr. Cato Mulligan DM; HTN; Obesity]     HPI: Patient is a 62 y.o. Caucasian male who is here to transfer care from Dr. Cato Mulligan at the [facility] office, get routine f/u for DM, HTN, morbid obesity. Anxiety in the recent past (1 mo) has caused some problems with appetite, esp in evenings, resulting in some hypoglycemia due to poor intake but taking recommended doses of his 70/30 insulin. Then, he appropriately scaled back his 70/30 insulin to 25 qAM and 15 qPM---glucoses have been 100-150 since this time, no further hypoglycemic episodes.  He is titrating insulin some days to adjust for variation in diet/activity some lately as well--he wonders if this self management approach is ok.    Regarding his anxiety: he thought he was dying due to his obesity.  He wanted to avoid being around any younger people b/c he would feel like he was going to die--felt ok as long as he was around people older than him. One mo ago tried starting paxil 20mg  qd and stopped it after a few days b/c he felt claustrophobic.  This claustrophobic feeling resolved with d/c of this med.  He was rx'd clonaz but never filled rx. He now feels like he has learned to channel some of his anxiety and is doing better w/out meds, plus has started to take a B complex vitamin and feels better.   Pertinent PMH:  Past Medical History  Diagnosis Date  . DIABETES MELLITUS, TYPE II 08/11/2006  . HYPERTENSION 08/11/2006  . ALLERGIC RHINITIS 08/11/2006  . ASTHMA 08/11/2006  . DM W/RENAL MANIFESTATIONS, TYPE II 04/20/2007  . OBESITY, MORBID 12/14/2007   Past Surgical History  Procedure Laterality Date  . Ureteral stent placement     History   Social History Narrative   Single, no children.   Lives in [place] since 1985 [place] from [place]).   Occupation: Science writer for [employer].   No Tobacco, no  alc, drugs.   Exercise: no    Diet: no          MEDS:  Outpatient Prescriptions Prior to Visit  Medication Sig Dispense Refill  . ACETAMINOPHEN-BUTALBITAL 50-325 MG TABS TAKE AS DIRECTED AS NEEDED  30 each  5  . aspirin 325 MG tablet Take 325 mg by mouth every morning.       . BD ULTRA-FINE PEN NEEDLES 29G X 12.7MM MISC USE TWICE DAILY  200 each  0  . furosemide (LASIX) 20 MG tablet Take 1 tablet (20 mg total) by mouth daily.  90 tablet  3  . glipiZIDE (GLUCOTROL) 5 MG tablet TAKE ONE TABLET BY MOUTH EVERY DAY  90 tablet  0  . HUMULIN 70/30 PEN (70-30) 100 UNIT/ML SUPN INJECT 35 UNITS INTO THE SKIN EVERY DAY WITH BREAKFAST AND 30 UNITS WITH DINNER  15 mL  0  . Insulin Pen Needle (BD ULTRA-FINE PEN NEEDLES) 29G X 12.7MM MISC 1 each by Other route 2 (two) times daily.  200 each  2  . lisinopril (PRINIVIL,ZESTRIL) 40 MG tablet Take 1 tablet (40 mg total) by mouth daily.  90 tablet  3  . metFORMIN (GLUCOPHAGE) 1000 MG tablet TAKE ONE TABLET BY MOUTH TWICE DAILY WITH MEALS  180 tablet  0  . metoprolol (LOPRESSOR) 50 MG tablet TAKE ONE TABLET BY MOUTH TWICE DAILY  180 tablet  0  . nitrofurantoin,  macrocrystal-monohydrate, (MACROBID) 100 MG capsule TAKE 1 CAPSULE BY MOUTH ONCE WEEKLY  30 capsule  0  . TAZTIA XT 360 MG 24 hr capsule TAKE AS DRIECTED  90 capsule  0  . terazosin (HYTRIN) 10 MG capsule TAKE ONE CAPSULE BY MOUTH ONCE DAILY AT BEDTIME  90 capsule  1  . clonazePAM (KLONOPIN) 1 MG tablet Take 1 tablet (1 mg total) by mouth 2 (two) times daily as needed for anxiety.  20 tablet  1  . PARoxetine (PAXIL) 20 MG tablet Take 1 tablet (20 mg total) by mouth every morning. Take 1/2 tab x 4 days then increase to 1 tab  30 tablet  2   No facility-administered medications prior to visit.  **Pt not taking clonaz and paxil as listed above (see HPI)  PE: Blood pressure 146/70, pulse 80, temperature 97.9 F (36.6 C), temperature source Oral, resp. rate 18, height 5' 8.25" (1.734 m), weight 378 lb 8  oz (171.686 kg), SpO2 97.00%. Gen: Alert, well appearing.  Patient is oriented to person, place, time, and situation. CV: RRR, no m/r/g.   LUNGS: CTA bilat, nonlabored resps, good aeration in all lung fields.  Lab Results  Component Value Date   CHOL 114 10/12/2012   HDL 32.60* 10/12/2012   LDLCALC 65 10/12/2012   TRIG 80.0 10/12/2012   CHOLHDL 3 10/12/2012   Lab Results  Component Value Date   ALT 16 10/12/2012   AST 18 10/12/2012   ALKPHOS 53 10/12/2012   BILITOT 0.8 10/12/2012    IMPRESSION AND PLAN:  Transfer pt.  DIABETES MELLITUS, TYPE II Sounds like he is doing self titration of his insulin appropriately. However, he has no interest in diet or exercise and understands that this is the mainstay of treatment for his DM, HTN, and his obesity. Check HbA1c and urine microalb/cr today.  HYPERTENSION Problem stable.  Continue current medications and diet appropriate for this condition.  We have reviewed our general long term plan for this problem and also reviewed symptoms and signs that should prompt the patient to call or return to the office. BMET today.  OBESITY, MORBID Encouraged pt to exercise at least a minimal amount, try to pay attention to diet some. Will continue to slowly work through his resistance to this.   An After Visit Summary was printed and given to the patient.  FOLLOW UP: 4 mo

## 2013-03-30 NOTE — Assessment & Plan Note (Signed)
Problem stable.  Continue current medications and diet appropriate for this condition.  We have reviewed our general long term plan for this problem and also reviewed symptoms and signs that should prompt the patient to call or return to the office. BMET today.

## 2013-03-30 NOTE — Assessment & Plan Note (Signed)
Encouraged pt to exercise at least a minimal amount, try to pay attention to diet some. Will continue to slowly work through his resistance to this.

## 2013-04-12 ENCOUNTER — Ambulatory Visit: Payer: Managed Care, Other (non HMO) | Admitting: Internal Medicine

## 2013-04-12 ENCOUNTER — Other Ambulatory Visit: Payer: Self-pay | Admitting: Internal Medicine

## 2013-04-13 ENCOUNTER — Other Ambulatory Visit: Payer: Self-pay | Admitting: Internal Medicine

## 2013-04-14 NOTE — Telephone Encounter (Signed)
rx printed

## 2013-04-14 NOTE — Telephone Encounter (Signed)
[name], MD at 04/14/2013 11:49 AM Correction: glipizide rx not printed---it was sent by eRx.

## 2013-04-24 ENCOUNTER — Other Ambulatory Visit: Payer: Self-pay | Admitting: Internal Medicine

## 2013-05-18 ENCOUNTER — Encounter: Payer: Self-pay | Admitting: Family Medicine

## 2013-05-18 ENCOUNTER — Other Ambulatory Visit: Payer: Self-pay | Admitting: Internal Medicine

## 2013-05-18 ENCOUNTER — Other Ambulatory Visit: Payer: Self-pay | Admitting: Family Medicine

## 2013-05-18 MED ORDER — INSULIN PEN NEEDLE 29G X 12.7MM MISC: 1.0000 | Freq: Two times a day (BID) | Status: DC

## 2013-05-19 ENCOUNTER — Telehealth: Payer: Self-pay | Admitting: Family Medicine

## 2013-05-19 NOTE — Telephone Encounter (Signed)
Patients insurance cost went up to $75 for humalin.  Patient called insurance to see what medication they would better cover and they said that novolog flex pain 70/30 would only  Be $45.  Can I fill this for patient?  Please advise.

## 2013-05-20 MED ORDER — INSULIN ASPART PROT & ASPART (70-30 MIX) 100 UNIT/ML PEN: 35.0000 [IU] | PEN_INJECTOR | Freq: Two times a day (BID) | SUBCUTANEOUS | Status: DC

## 2013-05-20 NOTE — Telephone Encounter (Signed)
OK.  I eRx'd this new med and d/c'd the other from his med list.  Pls have his pharmacy d/c his humulin 70/30.--thx

## 2013-05-20 NOTE — Telephone Encounter (Signed)
Called pharmacy to D/C humalin.

## 2013-05-31 ENCOUNTER — Other Ambulatory Visit: Payer: Self-pay | Admitting: Internal Medicine

## 2013-06-21 ENCOUNTER — Other Ambulatory Visit: Payer: Self-pay | Admitting: Internal Medicine

## 2013-06-21 NOTE — Telephone Encounter (Signed)
I RF'd pt's nitrofurantoin as previously prescribed but at next f/u we need to review the indication for this med and make sure it is still an appropriate med for him to keep taking this way.

## 2013-06-25 ENCOUNTER — Encounter: Payer: Self-pay | Admitting: Family Medicine

## 2013-06-25 MED ORDER — INSULIN LISPRO 100 UNIT/ML ~~LOC~~ SOLN: 35.0000 [IU] | Freq: Two times a day (BID) | SUBCUTANEOUS | Status: DC

## 2013-06-25 NOTE — Telephone Encounter (Signed)
Medication sent to pharmacy, okay by Dr. Milinda Cave.

## 2013-06-25 NOTE — Telephone Encounter (Signed)
Please advise medication change from novolog to humalog.

## 2013-06-27 ENCOUNTER — Other Ambulatory Visit: Payer: Self-pay | Admitting: Internal Medicine

## 2013-06-29 ENCOUNTER — Other Ambulatory Visit: Payer: Self-pay | Admitting: Internal Medicine

## 2013-07-25 ENCOUNTER — Encounter: Payer: Self-pay | Admitting: Family Medicine

## 2013-07-26 ENCOUNTER — Encounter: Payer: Self-pay | Admitting: Family Medicine

## 2013-07-26 ENCOUNTER — Ambulatory Visit (INDEPENDENT_AMBULATORY_CARE_PROVIDER_SITE_OTHER): Payer: Managed Care, Other (non HMO) | Admitting: Family Medicine

## 2013-07-26 VITALS — BP 191/82 | HR 90 | Temp 96.8°F | Resp 20 | Ht 68.25 in | Wt 387.0 lb

## 2013-07-26 DIAGNOSIS — Z23 Encounter for immunization: Secondary | ICD-10-CM

## 2013-07-26 DIAGNOSIS — L0390 Cellulitis, unspecified: Secondary | ICD-10-CM

## 2013-07-26 DIAGNOSIS — L0291 Cutaneous abscess, unspecified: Secondary | ICD-10-CM

## 2013-07-26 DIAGNOSIS — I878 Other specified disorders of veins: Secondary | ICD-10-CM

## 2013-07-26 DIAGNOSIS — I872 Venous insufficiency (chronic) (peripheral): Secondary | ICD-10-CM

## 2013-07-26 LAB — BASIC METABOLIC PANEL
CO2: 29 mEq/L (ref 19–32)
GFR: 75.99 mL/min (ref >60.00)
Glucose, Bld: 168 mg/dL — ABNORMAL HIGH (ref 70–99)
Potassium: 4.4 mEq/L (ref 3.5–5.1)
Sodium: 137 mEq/L (ref 135–145)

## 2013-07-26 LAB — CBC WITH DIFFERENTIAL/PLATELET
Basophils Absolute: 0 10*3/uL (ref 0.0–0.1)
Eosinophils Relative: 4.1 % (ref 0.0–5.0)
Lymphocytes Relative: 21.1 % (ref 12.0–46.0)
Monocytes Relative: 9.6 % (ref 3.0–12.0)
Neutrophils Relative %: 64.7 % (ref 43.0–77.0)
Platelets: 226 10*3/uL (ref 150.0–400.0)
RDW: 15.1 % — ABNORMAL HIGH (ref 11.5–14.6)
WBC: 7.5 10*3/uL (ref 4.5–10.5)

## 2013-07-26 MED ORDER — CEPHALEXIN 500 MG PO CAPS: 500.0000 mg | ORAL_CAPSULE | Freq: Three times a day (TID) | ORAL | Status: DC

## 2013-07-26 NOTE — Progress Notes (Signed)
OFFICE NOTE  07/26/2013  CC:  Chief Complaint  [name] presents with  . Cellulitis    Left leg x Friday     HPI: [name] is a 62 y.o. Caucasian male who is here for rash on both legs. Hurts, increased swelling and ankle redness, x 1 wk.  Malaise, no feeling of fever.  More trouble ambulating lately. Both legs swelling quite a bit more lately.  ROS: increased thirst, slight "pinch" when urinating lately, constipation.  Right leg sore from buttocks to thigh. Last BM was 3 d/a.     Pertinent PMH:  Past Medical History  Diagnosis Date  . DIABETES MELLITUS, TYPE II 08/11/2006  . HYPERTENSION 08/11/2006  . ALLERGIC RHINITIS 08/11/2006  . ASTHMA 08/11/2006  . DM W/RENAL MANIFESTATIONS, TYPE II 04/20/2007  . OBESITY, MORBID 12/14/2007   Past surgical, social, and family history reviewed and no changes noted since last office visit.  MEDS:  Outpatient Prescriptions Prior to Visit  Medication Sig Dispense Refill  . ACETAMINOPHEN-BUTALBITAL 50-325 MG TABS TAKE AS DIRECTED AS NEEDED  30 each  5  . aspirin 325 MG tablet Take 325 mg by mouth every morning.       Marland Kitchen b complex vitamins capsule Take 1 capsule by mouth every morning.      . furosemide (LASIX) 20 MG tablet Take 1 tablet (20 mg total) by mouth daily.  90 tablet  3  . glipiZIDE (GLUCOTROL) 5 MG tablet TAKE ONE TABLET BY MOUTH ONCE DAILY  90 tablet  1  . Insulin Aspart Prot & Aspart (NOVOLOG MIX 70/30 FLEXPEN) (70-30) 100 UNIT/ML SUPN Inject 35 Units into the skin 2 (two) times daily.  15 mL  5  . lisinopril (PRINIVIL,ZESTRIL) 40 MG tablet TAKE ONE TABLET BY MOUTH EVERY DAY  90 tablet  2  . metFORMIN (GLUCOPHAGE) 1000 MG tablet TAKE ONE TABLET BY MOUTH TWICE DAILY WITH MEALS  180 tablet  0  . metoprolol (LOPRESSOR) 50 MG tablet TAKE ONE TABLET BY MOUTH TWICE DAILY  180 tablet  1  . Multiple Vitamin (MULTIVITAMIN) tablet Take 1 tablet by mouth daily.      . nitrofurantoin, macrocrystal-monohydrate, (MACROBID) 100 MG capsule TAKE ONE  CAPSULE BY MOUTH ONCE WEEKLY  10 capsule  0  . TAZTIA XT 360 MG 24 hr capsule TAKE 1 CAPSULE BY MOUTH EVERY DAY AS DIRECTED  90 capsule  1  . terazosin (HYTRIN) 10 MG capsule TAKE ONE CAPSULE BY MOUTH ONCE DAILY AT BEDTIME  90 capsule  1  . vitamin E 400 UNIT capsule Take 400 Units by mouth every morning.      . BD ULTRA-FINE PEN NEEDLES 29G X 12.7MM MISC USE TWICE DAILY  200 each  0  . insulin lispro (HUMALOG) 100 UNIT/ML injection Inject 35 Units into the skin 2 (two) times daily.  10 mL  3  . Insulin Pen Needle (BD ULTRA-FINE PEN NEEDLES) 29G X 12.7MM MISC 1 each by Other route 2 (two) times daily.  200 each  2   No facility-administered medications prior to visit.  Probiotic qd  PE: Blood pressure 191/82, pulse 90, temperature 96.8 F (36 C), temperature source Temporal, resp. rate 20, height 5' 8.25" (1.734 m), weight 387 lb (175.542 kg), SpO2 94.00%. Gen: Alert, well appearing, morbidly obese.  [name] is oriented to person, place, time, and situation. CV: RRR, no m/r/g.   LUNGS: CTA bilat, nonlabored resps, good aeration in all lung fields. Both lower legs from knees down to feet are  swollen/edematous (4+ pitting).  Slight skin breakdown and weeping of skin on medial left ankle.  Minimal TTP diffusely in both ankles, with mild splotchy erythema in ankles.  No streaking.   IMPRESSION AND PLAN:  Cellulitis, both LE's--complication of his LE venous stasis dz. Discussed low Na diet, continue compression stockings, elevation of legs frequently, check CBC and BMET today. Start keflex 500mg  tid x 10d. He wants to wait on any laxative b/c he says once he gets better regarding treating his cellulitis things naturally get better with bowels for him.  An After Visit Summary was printed and given to the [name].  FOLLOW UP:  1 wk

## 2013-08-02 ENCOUNTER — Encounter: Payer: Self-pay | Admitting: Family Medicine

## 2013-08-02 ENCOUNTER — Ambulatory Visit (INDEPENDENT_AMBULATORY_CARE_PROVIDER_SITE_OTHER): Payer: Managed Care, Other (non HMO) | Admitting: Family Medicine

## 2013-08-02 VITALS — BP 196/84 | HR 72 | Temp 98.0°F | Resp 18 | Ht 68.25 in | Wt 386.0 lb

## 2013-08-02 DIAGNOSIS — L0390 Cellulitis, unspecified: Secondary | ICD-10-CM

## 2013-08-02 DIAGNOSIS — E1129 Type 2 diabetes mellitus with other diabetic kidney complication: Secondary | ICD-10-CM

## 2013-08-02 DIAGNOSIS — R609 Edema, unspecified: Secondary | ICD-10-CM

## 2013-08-02 DIAGNOSIS — I10 Essential (primary) hypertension: Secondary | ICD-10-CM

## 2013-08-02 DIAGNOSIS — I872 Venous insufficiency (chronic) (peripheral): Secondary | ICD-10-CM

## 2013-08-02 DIAGNOSIS — L0291 Cutaneous abscess, unspecified: Secondary | ICD-10-CM

## 2013-08-02 DIAGNOSIS — L03119 Cellulitis of unspecified part of limb: Secondary | ICD-10-CM | POA: Insufficient documentation

## 2013-08-02 DIAGNOSIS — E119 Type 2 diabetes mellitus without complications: Secondary | ICD-10-CM

## 2013-08-02 DIAGNOSIS — R300 Dysuria: Secondary | ICD-10-CM

## 2013-08-02 DIAGNOSIS — IMO0002 Reserved for concepts with insufficient information to code with codable children: Secondary | ICD-10-CM

## 2013-08-02 DIAGNOSIS — Z9889 Other specified postprocedural states: Secondary | ICD-10-CM

## 2013-08-02 DIAGNOSIS — R600 Localized edema: Secondary | ICD-10-CM

## 2013-08-02 LAB — URINALYSIS, ROUTINE W REFLEX MICROSCOPIC
Hgb urine dipstick: NEGATIVE
Ketones, ur: NEGATIVE
Leukocytes, UA: NEGATIVE
Specific Gravity, Urine: >=1.03 (ref 1.000–1.030)
Total Protein, Urine: 30
Urine Glucose: NEGATIVE
Urobilinogen, UA: 0.2 (ref 0.0–1.0)
pH: 6 (ref 5.0–8.0)

## 2013-08-02 LAB — HEMOGLOBIN A1C: Hgb A1c MFr Bld: 6.8 % — ABNORMAL HIGH (ref 4.6–6.5)

## 2013-08-02 MED ORDER — NITROFURANTOIN MONOHYD MACRO 100 MG PO CAPS: ORAL_CAPSULE | ORAL | Status: DC

## 2013-08-02 MED ORDER — METOPROLOL TARTRATE 50 MG PO TABS: ORAL_TABLET | ORAL | Status: DC

## 2013-08-02 MED ORDER — FUROSEMIDE 40 MG PO TABS: 40.0000 mg | ORAL_TABLET | Freq: Every day | ORAL | Status: DC

## 2013-08-02 NOTE — Assessment & Plan Note (Signed)
Reinforced need to adhere to low Na diet, elevate legs. Will increase daily lasix to 40mg  qd. BMET 1 wk.

## 2013-08-02 NOTE — Assessment & Plan Note (Signed)
Control good as per last HbA1c, needs recheck of this today. Discussed possible self titration of his 70/30 in morning according to what he plans to eat for BF and lunch on these days, plus pt needs to verify that the novolog we switched to 2 mo ago is 70/30 and not "regular" novolog.

## 2013-08-02 NOTE — Assessment & Plan Note (Signed)
Increase lopressor to 75mg  bid. Nurse visit for bp check 1 wk. Also, increase lasix to 40mg  qd. BMET 1 wk.

## 2013-08-02 NOTE — Assessment & Plan Note (Signed)
Resolved with keflex. 

## 2013-08-02 NOTE — Progress Notes (Signed)
OFFICE NOTE  08/02/2013  CC:  Chief Complaint  Patient presents with  . Diabetes  . Hypertension     HPI: Patient is a 62 y.o. Caucasian male who is here for 4 mo f/u DM, HTN, obesity.  Also 1 wk f/u lower legs cellulitis--has been on keflex. His LE venous insuff edema was worse recently and we re-discussed routine measures to help this.   Legs feeling MUCH better--no pain.  No fevers/chills/malaise.  Some swelling problems still, in fact a bit worse since eating high Na dinner at his sisters last night. No meds taken yet today.  Fasting today. No home bp's to report. No HAs or dizziness.  Review of home sugars from the last 1 wk: fastings 145-155.  Some elevated readings prior to his supper meal--admits to poor dietary choices on some of these occasions.  Takes 70/30 Flexpen 25 qAM and 20-25 U with evening meal (switched from humalog mix about 2 mo ago). Still with a "pinch" in penis when urinating, sometimes very brief and other times seems to last longer while urine stream is going. Denies urinary frequency or urgency.  Says he has no problem getting his urine stream started, says stream is straight and forceful, without hematuria.  No lower abd discomfort and no nausea.  He gives hist of urethral stent placement in the past but can't recall when or who placed it, says he has been rx'd macrobid to take prn when he feels sx's of obstruction but he does not feel these sx's currently--only the "pinch".  Pertinent PMH:  Past Medical History  Diagnosis Date  . DIABETES MELLITUS, TYPE II 08/11/2006  . HYPERTENSION 08/11/2006  . ALLERGIC RHINITIS 08/11/2006  . ASTHMA 08/11/2006  . DM W/RENAL MANIFESTATIONS, TYPE II 04/20/2007  . OBESITY, MORBID 12/14/2007   Past Surgical History  Procedure Laterality Date  . Ureteral stent placement     History   Social History Narrative   Single, no children.   Lives in [name] since 1985 [place] from [place]).   Occupation: Science writer for  [employer].   No Tobacco, no alc, drugs.   Exercise: no    Diet: no          MEDS:  Outpatient Prescriptions Prior to Visit  Medication Sig Dispense Refill  . aspirin 325 MG tablet Take 325 mg by mouth every morning.       Marland Kitchen b complex vitamins capsule Take 1 capsule by mouth every morning.      . cephALEXin (KEFLEX) 500 MG capsule Take 1 capsule (500 mg total) by mouth 3 (three) times daily.  30 capsule  0  . furosemide (LASIX) 20 MG tablet Take 1 tablet (20 mg total) by mouth daily.  90 tablet  3  . glipiZIDE (GLUCOTROL) 5 MG tablet TAKE ONE TABLET BY MOUTH ONCE DAILY  90 tablet  1  . Insulin Aspart Prot & Aspart (NOVOLOG MIX 70/30 FLEXPEN) (70-30) 100 UNIT/ML SUPN Inject 35 Units into the skin 2 (two) times daily.  15 mL  5  . lisinopril (PRINIVIL,ZESTRIL) 40 MG tablet TAKE ONE TABLET BY MOUTH EVERY DAY  90 tablet  2  . metFORMIN (GLUCOPHAGE) 1000 MG tablet TAKE ONE TABLET BY MOUTH TWICE DAILY WITH MEALS  180 tablet  0  . Multiple Vitamin (MULTIVITAMIN) tablet Take 1 tablet by mouth daily.      . nitrofurantoin, macrocrystal-monohydrate, (MACROBID) 100 MG capsule TAKE ONE CAPSULE BY MOUTH ONCE WEEKLY  10 capsule  0  . TAZTIA XT  360 MG 24 hr capsule TAKE 1 CAPSULE BY MOUTH EVERY DAY AS DIRECTED  90 capsule  1  . terazosin (HYTRIN) 10 MG capsule TAKE ONE CAPSULE BY MOUTH ONCE DAILY AT BEDTIME  90 capsule  1  . vitamin E 400 UNIT capsule Take 400 Units by mouth every morning.      . ACETAMINOPHEN-BUTALBITAL 50-325 MG TABS TAKE AS DIRECTED AS NEEDED  30 each  5  . BD ULTRA-FINE PEN NEEDLES 29G X 12.7MM MISC USE TWICE DAILY  200 each  0  . insulin lispro (HUMALOG) 100 UNIT/ML injection Inject 35 Units into the skin 2 (two) times daily.  10 mL  3  . Insulin Pen Needle (BD ULTRA-FINE PEN NEEDLES) 29G X 12.7MM MISC 1 each by Other route 2 (two) times daily.  200 each  2  . metoprolol (LOPRESSOR) 50 MG tablet TAKE ONE TABLET BY MOUTH TWICE DAILY  180 tablet  1   No facility-administered  medications prior to visit.    PE: Blood pressure 196/84, pulse 72, temperature 98 F (36.7 C), temperature source Temporal, resp. rate 18, height 5' 8.25" (1.734 m), weight 386 lb (175.088 kg), SpO2 96.00%.  BP recheck in room with manual LARGE cuff on right upper arm 180/86 (initial check by nurse was a wrist reading). Gen: Alert, well appearing, morbidly obese.  Patient is oriented to person, place, time, and situation. AFFECT: pleasant, lucid thought and speech. CV: RRR, S1 and S2 distant.  Rate approx  70-75. Lungs clear, nonlabored resps. EXT: tense 3+ pitting edema in both lower legs with some pinkish patches of stasis dermatitis on both anterior tibial surfaces.  No warmth or tenderness or streaking.  No clubbing or cyanosis.  IMPRESSION AND PLAN:  Uncontrolled hypertension Increase lopressor to 75mg  bid. Nurse visit for bp check 1 wk. Also, increase lasix to 40mg  qd. BMET 1 wk.  VENOUS INSUFFICIENCY, LEGS Reinforced need to adhere to low Na diet, elevate legs. Will increase daily lasix to 40mg  qd. BMET 1 wk.  DM W/RENAL MANIFESTATIONS, TYPE II Control good as per last HbA1c, needs recheck of this today. Discussed possible self titration of his 70/30 in morning according to what he plans to eat for BF and lunch on these days, plus pt needs to verify that the novolog we switched to 2 mo ago is 70/30 and not "regular" novolog.  Cellulitis Resolved with keflex.  Dysuria Hx of urethral stent (in place currently per pt). Check UA with reflex microscopy + urine clx today. RF'd macrobid to continue to use prn acute obstructive sx's, but we may need to get him to re-establish with urology.   An After Visit Summary was printed and given to the patient.  FOLLOW UP: 1 wk for nurse visit for bp check and also lab visit 1 wk for BMET. O/V with me in 83mo to f/u HTN.

## 2013-08-02 NOTE — Assessment & Plan Note (Signed)
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 Check UA with reflex microscopy + urine clx today. RF'd macrobid to continue to use prn acute obstructive sx's, but we may need to get him to re-establish with urology.

## 2013-08-02 NOTE — Patient Instructions (Signed)
Increase lasix to 2 tabs per day.  Increase metoprolol (lopressor) 50mg  tabs to 1 and 1/2 tabs TWICE per day

## 2013-08-03 LAB — URINE CULTURE
Colony Count: NO GROWTH
Organism ID, Bacteria: NO GROWTH

## 2013-08-10 ENCOUNTER — Other Ambulatory Visit (INDEPENDENT_AMBULATORY_CARE_PROVIDER_SITE_OTHER): Payer: Managed Care, Other (non HMO)

## 2013-08-10 DIAGNOSIS — I10 Essential (primary) hypertension: Secondary | ICD-10-CM

## 2013-08-10 LAB — BASIC METABOLIC PANEL
CO2: 29 mEq/L (ref 19–32)
Calcium: 8.6 mg/dL (ref 8.4–10.5)
Creatinine, Ser: 1.1 mg/dL (ref 0.4–1.5)
GFR: 70.53 mL/min (ref >60.00)
Glucose, Bld: 150 mg/dL — ABNORMAL HIGH (ref 70–99)
Potassium: 4.4 mEq/L (ref 3.5–5.1)
Sodium: 139 mEq/L (ref 135–145)

## 2013-08-10 NOTE — Progress Notes (Signed)
Labs only

## 2013-08-30 ENCOUNTER — Ambulatory Visit (INDEPENDENT_AMBULATORY_CARE_PROVIDER_SITE_OTHER): Payer: Managed Care, Other (non HMO) | Admitting: Family Medicine

## 2013-08-30 ENCOUNTER — Encounter: Payer: Self-pay | Admitting: Family Medicine

## 2013-08-30 VITALS — BP 135/70 | HR 72 | Temp 97.8°F | Resp 20 | Ht 68.75 in | Wt 389.0 lb

## 2013-08-30 DIAGNOSIS — E1129 Type 2 diabetes mellitus with other diabetic kidney complication: Secondary | ICD-10-CM

## 2013-08-30 DIAGNOSIS — N369 Urethral disorder, unspecified: Secondary | ICD-10-CM | POA: Insufficient documentation

## 2013-08-30 DIAGNOSIS — R300 Dysuria: Secondary | ICD-10-CM

## 2013-08-30 DIAGNOSIS — R600 Localized edema: Secondary | ICD-10-CM

## 2013-08-30 DIAGNOSIS — E119 Type 2 diabetes mellitus without complications: Secondary | ICD-10-CM

## 2013-08-30 DIAGNOSIS — I10 Essential (primary) hypertension: Secondary | ICD-10-CM

## 2013-08-30 DIAGNOSIS — IMO0002 Reserved for concepts with insufficient information to code with codable children: Secondary | ICD-10-CM

## 2013-08-30 DIAGNOSIS — Z9889 Other specified postprocedural states: Secondary | ICD-10-CM

## 2013-08-30 DIAGNOSIS — R609 Edema, unspecified: Secondary | ICD-10-CM

## 2013-08-30 MED ORDER — NITROFURANTOIN MONOHYD MACRO 100 MG PO CAPS: ORAL_CAPSULE | ORAL | Status: DC

## 2013-08-30 MED ORDER — METOPROLOL TARTRATE 50 MG PO TABS: ORAL_TABLET | ORAL | Status: DC

## 2013-08-30 NOTE — Assessment & Plan Note (Signed)
Pt has urethral stent. Occasionally he feels a pinch when urinating and the urine flow is diminished.  He takes one nitrofurantoin when this occurs and it clears everything up. Nitrofurantoin RF'd today.

## 2013-08-30 NOTE — Assessment & Plan Note (Signed)
The current medical regimen is effective;  continue present plan and medications.  

## 2013-08-30 NOTE — Assessment & Plan Note (Signed)
The current medical regimen is effective;  continue present plan and medications. He is UTD on all DM monitoring at this time.

## 2013-08-30 NOTE — Progress Notes (Signed)
OFFICE NOTE  08/30/2013  CC:  Chief Complaint  Patient presents with  . Follow-up     HPI: Patient is a 62 y.o. Caucasian male who is here for 1 mo f/u DM 2 and uncontrolled HTN. Reports bp normal at home, no side effects from meds. He has not increased his lasix to 40 qd like we discussed last visit.  He feels like his LE edema has not suffered from this.  Checks glucoses infrequently: 130-180 range, lowest is 89.  Takes 25 U novolog (not mix) bid.  His last HbA1c on the current regimen was fine (<7).  Has had some left sided sciatic pain lately after sitting "wrong" for a day at work.  Left glut region extending down post thigh, back of knee, to about midway down left calf.  Some stretches are being done.  Has had this in the past and done stretches and this helped it resolved completely so he plans on continuing this.   Pertinent PMH:  Past Medical History  Diagnosis Date  . DIABETES MELLITUS, TYPE II 08/11/2006  . HYPERTENSION 08/11/2006  . ALLERGIC RHINITIS 08/11/2006  . ASTHMA 08/11/2006  . DM W/RENAL MANIFESTATIONS, TYPE II 04/20/2007  . OBESITY, MORBID 12/14/2007   Past surgical, social, and family history reviewed and no changes noted since last office visit.  MEDS:  Outpatient Prescriptions Prior to Visit  Medication Sig Dispense Refill  . ACETAMINOPHEN-BUTALBITAL 50-325 MG TABS TAKE AS DIRECTED AS NEEDED  30 each  5  . aspirin 325 MG tablet Take 325 mg by mouth every morning.       Marland Kitchen b complex vitamins capsule Take 1 capsule by mouth every morning.      . BD ULTRA-FINE PEN NEEDLES 29G X 12.7MM MISC USE TWICE DAILY  200 each  0  . furosemide (LASIX) 40 MG tablet Take 1 tablet (40 mg total) by mouth daily.  30 tablet  3  . glipiZIDE (GLUCOTROL) 5 MG tablet TAKE ONE TABLET BY MOUTH ONCE DAILY  90 tablet  1  . Insulin Aspart Prot & Aspart (NOVOLOG MIX 70/30 FLEXPEN) (70-30) 100 UNIT/ML SUPN Inject 35 Units into the skin 2 (two) times daily.  15 mL  5  . lisinopril  (PRINIVIL,ZESTRIL) 40 MG tablet TAKE ONE TABLET BY MOUTH EVERY DAY  90 tablet  2  . metFORMIN (GLUCOPHAGE) 1000 MG tablet TAKE ONE TABLET BY MOUTH TWICE DAILY WITH MEALS  180 tablet  0  . metoprolol (LOPRESSOR) 50 MG tablet 1 and 1/2 tabs po bid  180 tablet  1  . Multiple Vitamin (MULTIVITAMIN) tablet Take 1 tablet by mouth daily.      . nitrofurantoin, macrocrystal-monohydrate, (MACROBID) 100 MG capsule 1 cap po q week prn  30 capsule  0  . saw palmetto 500 MG capsule Take 500 mg by mouth daily.      Marland Kitchen TAZTIA XT 360 MG 24 hr capsule TAKE 1 CAPSULE BY MOUTH EVERY DAY AS DIRECTED  90 capsule  1  . terazosin (HYTRIN) 10 MG capsule TAKE ONE CAPSULE BY MOUTH ONCE DAILY AT BEDTIME  90 capsule  1  . vitamin E 400 UNIT capsule Take 400 Units by mouth every morning.      . insulin lispro (HUMALOG) 100 UNIT/ML injection Inject 35 Units into the skin 2 (two) times daily.  10 mL  3  . Insulin Pen Needle (BD ULTRA-FINE PEN NEEDLES) 29G X 12.7MM MISC 1 each by Other route 2 (two) times daily.  200 each  2  . cephALEXin (KEFLEX) 500 MG capsule Take 1 capsule (500 mg total) by mouth 3 (three) times daily.  30 capsule  0   No facility-administered medications prior to visit.   LABS:  None today  Lab Results  Component Value Date   WBC 7.5 07/26/2013   HGB 13.3 07/26/2013   HCT 39.4 07/26/2013   MCV 88.8 07/26/2013   PLT 226.0 07/26/2013     Chemistry      Component Value Date/Time   NA 139 08/10/2013 0826   K 4.4 08/10/2013 0826   CL 100 08/10/2013 0826   CO2 29 08/10/2013 0826   BUN 12 08/10/2013 0826   CREATININE 1.1 08/10/2013 0826      Component Value Date/Time   CALCIUM 8.6 08/10/2013 0826   ALKPHOS 53 10/12/2012 1001   AST 18 10/12/2012 1001   ALT 16 10/12/2012 1001   BILITOT 0.8 10/12/2012 1001     Lab Results  Component Value Date   HGBA1C 6.8* 08/02/2013     PE: Blood pressure 135/70, pulse 72, temperature 97.8 F (36.6 C), temperature source Temporal, resp. rate 20, height 5' 8.75" (1.746  m), weight 389 lb (176.449 kg), SpO2 95.00%. Gen: Alert, well appearing.  Patient is oriented to person, place, time, and situation. No further exam today.  IMPRESSION AND PLAN:  Hypertension, essential The current medical regimen is effective;  continue present plan and medications.   DM W/RENAL MANIFESTATIONS, TYPE II The current medical regimen is effective;  continue present plan and medications. He is UTD on all DM monitoring at this time.  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 Occasionally he feels a pinch when urinating and the urine flow is diminished.  He takes one nitrofurantoin when this occurs and it clears everything up. Nitrofurantoin RF'd today.  Recent right sided sciatica: encouraged gentle stretching, relative rest.  Pt not interested in steroid burst at this time.  An After Visit Summary was printed and given to the patient.  FOLLOW UP: 106mo

## 2013-09-19 ENCOUNTER — Other Ambulatory Visit: Payer: Self-pay | Admitting: Internal Medicine

## 2013-09-19 ENCOUNTER — Encounter: Payer: Self-pay | Admitting: Family Medicine

## 2013-09-21 ENCOUNTER — Other Ambulatory Visit: Payer: Self-pay | Admitting: *Deleted

## 2013-09-21 DIAGNOSIS — IMO0002 Reserved for concepts with insufficient information to code with codable children: Secondary | ICD-10-CM

## 2013-09-21 DIAGNOSIS — E119 Type 2 diabetes mellitus without complications: Secondary | ICD-10-CM

## 2013-09-21 DIAGNOSIS — R600 Localized edema: Secondary | ICD-10-CM

## 2013-09-21 DIAGNOSIS — R300 Dysuria: Secondary | ICD-10-CM

## 2013-09-21 MED ORDER — METOPROLOL TARTRATE 50 MG PO TABS: ORAL_TABLET | ORAL | Status: DC

## 2013-09-21 NOTE — Telephone Encounter (Signed)
Resent rx

## 2013-09-22 ENCOUNTER — Other Ambulatory Visit: Payer: Self-pay | Admitting: Family Medicine

## 2013-09-22 DIAGNOSIS — E119 Type 2 diabetes mellitus without complications: Secondary | ICD-10-CM

## 2013-09-22 DIAGNOSIS — R600 Localized edema: Secondary | ICD-10-CM

## 2013-09-22 DIAGNOSIS — IMO0002 Reserved for concepts with insufficient information to code with codable children: Secondary | ICD-10-CM

## 2013-09-22 DIAGNOSIS — R300 Dysuria: Secondary | ICD-10-CM

## 2013-09-22 MED ORDER — METOPROLOL TARTRATE 50 MG PO TABS: ORAL_TABLET | ORAL | Status: DC

## 2013-10-11 ENCOUNTER — Other Ambulatory Visit: Payer: Self-pay | Admitting: Family Medicine

## 2013-10-11 MED ORDER — GLIPIZIDE 5 MG PO TABS: ORAL_TABLET | ORAL | Status: DC

## 2013-10-26 ENCOUNTER — Other Ambulatory Visit: Payer: Self-pay | Admitting: *Deleted

## 2013-10-26 MED ORDER — FUROSEMIDE 40 MG PO TABS: 40.0000 mg | ORAL_TABLET | Freq: Every day | ORAL | Status: DC

## 2013-10-26 NOTE — Telephone Encounter (Signed)
Pls notify pt that I see nothing in his history in his chart about this med. Ask him what medical condition it has been prescribed for in the past.--thx

## 2013-10-26 NOTE — Telephone Encounter (Signed)
Refill request for butalbital-acetaminophen-caffeine Last filled by MD on - never filled Last Appt- 08/30/13 Next Appt- 12/27/13 Please advise refill?

## 2013-10-27 MED ORDER — BUTALBITAL-APAP-CAFF-COD 50-325-40-30 MG PO CAPS: ORAL_CAPSULE | ORAL | Status: DC

## 2013-11-24 ENCOUNTER — Other Ambulatory Visit: Payer: Self-pay | Admitting: Family Medicine

## 2013-11-24 MED ORDER — DILTIAZEM HCL ER BEADS 360 MG PO CP24: ORAL_CAPSULE | ORAL | Status: DC

## 2013-12-27 ENCOUNTER — Ambulatory Visit (INDEPENDENT_AMBULATORY_CARE_PROVIDER_SITE_OTHER): Payer: 59 | Admitting: Family Medicine

## 2013-12-27 ENCOUNTER — Encounter: Payer: Self-pay | Admitting: Family Medicine

## 2013-12-27 VITALS — BP 136/78 | HR 85 | Temp 98.8°F | Resp 18 | Ht 68.75 in | Wt 385.0 lb

## 2013-12-27 DIAGNOSIS — IMO0001 Reserved for inherently not codable concepts without codable children: Secondary | ICD-10-CM

## 2013-12-27 DIAGNOSIS — E1165 Type 2 diabetes mellitus with hyperglycemia: Principal | ICD-10-CM

## 2013-12-27 LAB — HEMOGLOBIN A1C: Hgb A1c MFr Bld: 7.4 % — ABNORMAL HIGH (ref 4.6–6.5)

## 2013-12-27 MED ORDER — INSULIN LISPRO PROT & LISPRO (75-25 MIX) 100 UNIT/ML KWIKPEN: PEN_INJECTOR | SUBCUTANEOUS | Status: DC

## 2013-12-27 MED ORDER — FUROSEMIDE 40 MG PO TABS: 40.0000 mg | ORAL_TABLET | Freq: Every day | ORAL | Status: DC

## 2013-12-27 NOTE — Progress Notes (Signed)
OFFICE NOTE  12/27/2013  CC:  Chief Complaint  Patient presents with  . Follow-up    4 months  . Medication Refill    humalog 75-25 90 day if possible     HPI: Patient is a 63 y.o. Caucasian male who is here for 4 mo f/u DM 2, HTN. Currently giving 25 U humalog regular qAM and qSupper.   Taking glucophage and glipizide.  Discussed d/c of glipizide today.   Insurer mandating change to Humalog 75/25. "some sugars in 200s, some normal, some lows in afternoon b/c not hungry some days at lunch".  Feet: denies burning or tingling.  Intermittent numbness feeling on soles/heels but not toes. Says he would rather not do diabetic foot exam today. Last eye exam approx 2 yrs ago.  [facility] [place]  Compliant with bp meds.  No CP, no SOB, no dizziness.  Pertinent PMH:  Past medical, surgical, social, and family history reviewed and no changes are noted since last office visit.  MEDS:  Outpatient Prescriptions Prior to Visit  Medication Sig Dispense Refill  . ACETAMINOPHEN-BUTALBITAL 50-325 MG TABS TAKE AS DIRECTED AS NEEDED  30 each  5  . aspirin 325 MG tablet Take 325 mg by mouth every morning.       Marland Kitchen. b complex vitamins capsule Take 1 capsule by mouth every morning.      . cetirizine (ZYRTEC) 10 MG tablet Take 10 mg by mouth daily.      Marland Kitchen. diltiazem (TAZTIA XT) 360 MG 24 hr capsule TAKE 1 CAPSULE BY MOUTH EVERY DAY AS DIRECTED  90 capsule  1  . glipiZIDE (GLUCOTROL) 5 MG tablet TAKE ONE TABLET BY MOUTH ONCE DAILY  90 tablet  1  . Insulin Aspart Prot & Aspart (NOVOLOG MIX 70/30 FLEXPEN) (70-30) 100 UNIT/ML SUPN Inject 35 Units into the skin 2 (two) times daily.  15 mL  5  . lisinopril (PRINIVIL,ZESTRIL) 40 MG tablet TAKE ONE TABLET BY MOUTH EVERY DAY  90 tablet  2  . metFORMIN (GLUCOPHAGE) 1000 MG tablet TAKE ONE TABLET BY MOUTH TWICE DAILY WITH MEALS  180 tablet  0  . metoprolol (LOPRESSOR) 50 MG tablet 1 and 1/2 tabs po bid  270 tablet  1  . Multiple Vitamin  (MULTIVITAMIN) tablet Take 1 tablet by mouth daily.      . nitrofurantoin, macrocrystal-monohydrate, (MACROBID) 100 MG capsule 1 cap po q week prn  30 capsule  1  . saw palmetto 500 MG capsule Take 500 mg by mouth daily.      Marland Kitchen. terazosin (HYTRIN) 10 MG capsule TAKE ONE CAPSULE BY MOUTH ONCE DAILY AT BEDTIME  90 capsule  1  . vitamin E 400 UNIT capsule Take 400 Units by mouth every morning.      . furosemide (LASIX) 40 MG tablet Take 1 tablet (40 mg total) by mouth daily.  30 tablet  3  . BD ULTRA-FINE PEN NEEDLES 29G X 12.7MM MISC USE TWICE DAILY  200 each  0  . butalbital-acetaminophen-caffeine (FIORICET WITH CODEINE) 50-325-40-30 MG per capsule 1-2 caps po q6h prn headache  30 capsule  1  . Insulin Pen Needle (BD ULTRA-FINE PEN NEEDLES) 29G X 12.7MM MISC 1 each by Other route 2 (two) times daily.  200 each  2  . metFORMIN (GLUCOPHAGE) 1000 MG tablet TAKE ONE TABLET BY MOUTH TWICE DAILY WITH MEALS  180 tablet  1   No facility-administered medications prior to visit.    PE: Blood  pressure 136/78, pulse 85, temperature 98.8 F (37.1 C), temperature source Temporal, resp. rate 18, height 5' 8.75" (1.746 m), weight 385 lb (174.635 kg), SpO2 95.00%. Gen: Alert, well appearing.  Patient is oriented to person, place, time, and situation. No further exam today.   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 D/C glipizide. Change insulin to Humalog 75/25, 25 U qAM and 20 U qPM.   Reminded pt of eye exam overdue. Pt deferred foot exam until next f/u visit. Check HbA1c today.  2) HTN: stable.  Continue current meds (metop, dilt, lisin).  3) Obesity: pt still struggling to get more active/exercise. Also has some work to do with dieting.  Gave encouragement today.  An After Visit Summary was printed and given to the patient.  FOLLOW UP: 16mo

## 2013-12-27 NOTE — Progress Notes (Signed)
Pre visit review using our clinic review tool, if applicable. No additional management support is needed unless otherwise documented below in the visit note. 

## 2013-12-31 ENCOUNTER — Other Ambulatory Visit: Payer: Self-pay | Admitting: Family Medicine

## 2013-12-31 DIAGNOSIS — R300 Dysuria: Secondary | ICD-10-CM

## 2013-12-31 DIAGNOSIS — R600 Localized edema: Secondary | ICD-10-CM

## 2013-12-31 DIAGNOSIS — IMO0002 Reserved for concepts with insufficient information to code with codable children: Secondary | ICD-10-CM

## 2013-12-31 DIAGNOSIS — E119 Type 2 diabetes mellitus without complications: Secondary | ICD-10-CM

## 2013-12-31 MED ORDER — FUROSEMIDE 40 MG PO TABS: 40.0000 mg | ORAL_TABLET | Freq: Every day | ORAL | Status: DC

## 2013-12-31 MED ORDER — METFORMIN HCL 1000 MG PO TABS: ORAL_TABLET | ORAL | Status: DC

## 2013-12-31 MED ORDER — DILTIAZEM HCL ER BEADS 360 MG PO CP24: ORAL_CAPSULE | ORAL | Status: DC

## 2013-12-31 MED ORDER — METOPROLOL TARTRATE 50 MG PO TABS: ORAL_TABLET | ORAL | Status: DC

## 2013-12-31 MED ORDER — TERAZOSIN HCL 10 MG PO CAPS: ORAL_CAPSULE | ORAL | Status: DC

## 2013-12-31 MED ORDER — LISINOPRIL 40 MG PO TABS: ORAL_TABLET | ORAL | Status: DC

## 2014-02-11 ENCOUNTER — Other Ambulatory Visit: Payer: Self-pay | Admitting: Family Medicine

## 2014-02-11 MED ORDER — INSULIN PEN NEEDLE 29G X 12.7MM MISC: Status: DC

## 2014-03-18 ENCOUNTER — Encounter: Payer: Self-pay | Admitting: Family Medicine

## 2014-03-18 ENCOUNTER — Other Ambulatory Visit: Payer: Self-pay | Admitting: Family Medicine

## 2014-03-18 DIAGNOSIS — IMO0002 Reserved for concepts with insufficient information to code with codable children: Secondary | ICD-10-CM

## 2014-03-18 DIAGNOSIS — E119 Type 2 diabetes mellitus without complications: Secondary | ICD-10-CM

## 2014-03-18 DIAGNOSIS — R600 Localized edema: Secondary | ICD-10-CM

## 2014-03-18 DIAGNOSIS — R300 Dysuria: Secondary | ICD-10-CM

## 2014-03-18 MED ORDER — METOPROLOL TARTRATE 50 MG PO TABS: ORAL_TABLET | ORAL | Status: DC

## 2014-03-20 ENCOUNTER — Encounter: Payer: Self-pay | Admitting: Family Medicine

## 2014-04-11 ENCOUNTER — Encounter: Payer: Self-pay | Admitting: Family Medicine

## 2014-04-25 ENCOUNTER — Ambulatory Visit (INDEPENDENT_AMBULATORY_CARE_PROVIDER_SITE_OTHER): Payer: 59 | Admitting: Family Medicine

## 2014-04-25 ENCOUNTER — Encounter: Payer: Self-pay | Admitting: Family Medicine

## 2014-04-25 VITALS — BP 158/84 | HR 80 | Temp 97.6°F | Resp 20 | Ht 68.75 in | Wt 391.0 lb

## 2014-04-25 DIAGNOSIS — E1142 Type 2 diabetes mellitus with diabetic polyneuropathy: Secondary | ICD-10-CM

## 2014-04-25 DIAGNOSIS — I10 Essential (primary) hypertension: Secondary | ICD-10-CM

## 2014-04-25 DIAGNOSIS — E119 Type 2 diabetes mellitus without complications: Secondary | ICD-10-CM

## 2014-04-25 LAB — COMPREHENSIVE METABOLIC PANEL
ALT: 14 U/L (ref 0–53)
AST: 18 U/L (ref 0–37)
Albumin: 3.7 g/dL (ref 3.5–5.2)
Alkaline Phosphatase: 56 U/L (ref 39–117)
BUN: 15 mg/dL (ref 6–23)
CHLORIDE: 101 meq/L (ref 96–112)
CO2: 30 meq/L (ref 19–32)
CREATININE: 1.1 mg/dL (ref 0.4–1.5)
Calcium: 9 mg/dL (ref 8.4–10.5)
GFR: 74.18 mL/min (ref >60.00)
Glucose, Bld: 177 mg/dL — ABNORMAL HIGH (ref 70–99)
Potassium: 4.9 mEq/L (ref 3.5–5.1)
Sodium: 140 mEq/L (ref 135–145)
TOTAL PROTEIN: 6.6 g/dL (ref 6.0–8.3)
Total Bilirubin: 0.9 mg/dL (ref 0.2–1.2)

## 2014-04-25 LAB — TSH: TSH: 2.62 u[IU]/mL (ref 0.35–4.50)

## 2014-04-25 LAB — LIPID PANEL
CHOLESTEROL: 122 mg/dL (ref 0–200)
HDL: 33.9 mg/dL — AB (ref >39.00)
LDL Cholesterol: 68 mg/dL (ref 0–99)
NONHDL: 88.1
Total CHOL/HDL Ratio: 4
Triglycerides: 103 mg/dL (ref 0.0–149.0)
VLDL: 20.6 mg/dL (ref 0.0–40.0)

## 2014-04-25 LAB — MICROALBUMIN / CREATININE URINE RATIO
CREATININE, U: 283.3 mg/dL
Microalb Creat Ratio: 5.1 mg/g (ref 0.0–30.0)
Microalb, Ur: 14.5 mg/dL — ABNORMAL HIGH (ref 0.0–1.9)

## 2014-04-25 LAB — HEMOGLOBIN A1C: Hgb A1c MFr Bld: 7.2 % — ABNORMAL HIGH (ref 4.6–6.5)

## 2014-04-25 NOTE — Assessment & Plan Note (Signed)
HbA1c and urine microalb/cr today. Based on home glucoses reported today, will have him increase his 75/25 insulin to 35 U qBF and 32 U qSupper. Feet examined today. Eye exam UTD-no retinopathy. Cr/lytes today.

## 2014-04-25 NOTE — Assessment & Plan Note (Signed)
The current medical regimen is effective;  continue present plan and medications. Cr/lytes today. 

## 2014-04-25 NOTE — Progress Notes (Signed)
OFFICE NOTE  04/25/2014  CC:  Chief Complaint  Patient presents with  . Follow-up    fasting   HPI: Patient is a 63 y.o. Caucasian male who is here for 4 mo f/u DM 2, HTN, obesity. Due for diabetic foot exam and urine microalb/cr today.  Eye screening exam was done 02/22/14--no diab retpthy. Home gluc: fasting avg 130s, 2H PP up to 180s.  Insulin 75/25 at 30 U qBF and 30 U qSupper. No hypoglycemia.  Not following diabetic diet at all really. Says his problem is more of eating the wrong food but limits portion sizes fine. No exercise.  Sits at computer all day long.  Says he has "a little" tingling in left foot when his feet are "puffy", otherwise no feet sx's.  No home bp monitoring.  ROS: no CP, dizziness, HA's, SOB.  No vision complaints. Pertinent PMH:  Past medical, surgical, social, and family history reviewed and no changes are noted since last office visit.  MEDS:  Outpatient Prescriptions Prior to Visit  Medication Sig Dispense Refill  . ACETAMINOPHEN-BUTALBITAL 50-325 MG TABS TAKE AS DIRECTED AS NEEDED  30 each  5  . aspirin 325 MG tablet Take 325 mg by mouth every morning.       Marland Kitchen. b complex vitamins capsule Take 1 capsule by mouth every morning.      . diltiazem (TAZTIA XT) 360 MG 24 hr capsule TAKE 1 CAPSULE BY MOUTH EVERY DAY AS DIRECTED  90 capsule  1  . furosemide (LASIX) 40 MG tablet Take 1 tablet (40 mg total) by mouth daily.  90 tablet  1  . Insulin Lispro Prot & Lispro (HUMALOG MIX 75/25 KWIKPEN) (75-25) 100 UNIT/ML Kwikpen 25 U SQ qAM and 20 U SQ qSupper  15 mL  6  . lisinopril (PRINIVIL,ZESTRIL) 40 MG tablet TAKE ONE TABLET BY MOUTH EVERY DAY  90 tablet  1  . metFORMIN (GLUCOPHAGE) 1000 MG tablet TAKE ONE TABLET BY MOUTH TWICE DAILY WITH MEALS  180 tablet  0  . metoprolol (LOPRESSOR) 50 MG tablet 1 and 1/2 tabs po bid  270 tablet  1  . Multiple Vitamin (MULTIVITAMIN) tablet Take 1 tablet by mouth daily.      . nitrofurantoin, macrocrystal-monohydrate,  (MACROBID) 100 MG capsule 1 cap po q week prn  30 capsule  1  . saw palmetto 500 MG capsule Take 500 mg by mouth daily.      Marland Kitchen. terazosin (HYTRIN) 10 MG capsule TAKE ONE CAPSULE BY MOUTH ONCE DAILY AT BEDTIME  90 capsule  1  . vitamin E 400 UNIT capsule Take 400 Units by mouth every morning.      . butalbital-acetaminophen-caffeine (FIORICET WITH CODEINE) 50-325-40-30 MG per capsule 1-2 caps po q6h prn headache  30 capsule  1  . cetirizine (ZYRTEC) 10 MG tablet Take 10 mg by mouth daily.      Marland Kitchen. HYDROcodone-acetaminophen (NORCO) 7.5-325 MG per tablet       . Insulin Pen Needle (BD ULTRA-FINE PEN NEEDLES) 29G X 12.7MM MISC 1 each by Other route 2 (two) times daily.  200 each  2  . Insulin Pen Needle (BD ULTRA-FINE PEN NEEDLES) 29G X 12.7MM MISC USE TWICE DAILY  200 each  3   No facility-administered medications prior to visit.    PE: Blood pressure 158/84, pulse 80, temperature 97.6 F (36.4 C), temperature source Temporal, resp. rate 20, height 5' 8.75" (1.746 m), weight 391 lb (177.356 kg), SpO2 91.00%. Gen: Alert, well appearing,  obese-appearing WM in NAD.  Patient is oriented to person, place, time, and situation. EXT: 4+ pitting edema in both LL's down into the toes on both feet. Some hyperkeratotic rash in pretibial regions but no erythema, skin breakdown, nor tenderness.  No warmth. Monofilament testing shows subtle decreased sensation on plantar surface of left foot.  Toenails thickened.     IMPRESSION AND PLAN:  DIABETES MELLITUS, TYPE II HbA1c and urine microalb/cr today. Based on home glucoses reported today, will have him increase his 75/25 insulin to 35 U qBF and 32 U qSupper. Feet examined today. Eye exam UTD-no retinopathy. Cr/lytes today.  OBESITY, MORBID Noncompliant with diet. Sedentary lifestyle. He admits he needs to do better in these areas.  Hypertension, essential The current medical regimen is effective;  continue present plan and medications. Cr/lytes  today.   An After Visit Summary was printed and given to the patient.  FOLLOW UP: 3-4 mo for annual CPE

## 2014-04-25 NOTE — Assessment & Plan Note (Signed)
Noncompliant with diet. Sedentary lifestyle. He admits he needs to do better in these areas.

## 2014-04-25 NOTE — Progress Notes (Signed)
Pre visit review using our clinic review tool, if applicable. No additional management support is needed unless otherwise documented below in the visit note. 

## 2014-04-26 ENCOUNTER — Telehealth: Payer: Self-pay | Admitting: Family Medicine

## 2014-04-26 NOTE — Telephone Encounter (Signed)
Relevant patient education assigned to patient using Emmi. ° °

## 2014-04-27 ENCOUNTER — Encounter: Payer: Self-pay | Admitting: Family Medicine

## 2014-04-27 NOTE — Telephone Encounter (Signed)
Pt wants to know if he should continue glipizide.  Please advise.

## 2014-04-27 NOTE — Telephone Encounter (Signed)
No, he does not need to take glipizide anymore.-thx

## 2014-05-02 ENCOUNTER — Encounter: Payer: Self-pay | Admitting: Family Medicine

## 2014-05-02 ENCOUNTER — Ambulatory Visit (HOSPITAL_BASED_OUTPATIENT_CLINIC_OR_DEPARTMENT_OTHER)
Admission: RE | Admit: 2014-05-02 | Discharge: 2014-05-02 | Disposition: A | Discharge status: Home / Self Care | Payer: 59 | Source: Ambulatory Visit | Attending: Family Medicine | Admitting: Family Medicine

## 2014-05-02 ENCOUNTER — Ambulatory Visit (INDEPENDENT_AMBULATORY_CARE_PROVIDER_SITE_OTHER): Payer: 59 | Admitting: Family Medicine

## 2014-05-02 VITALS — BP 145/82 | HR 102 | Temp 97.9°F | Resp 20 | Ht 68.75 in | Wt 387.0 lb

## 2014-05-02 DIAGNOSIS — L03116 Cellulitis of left lower limb: Secondary | ICD-10-CM

## 2014-05-02 DIAGNOSIS — L02419 Cutaneous abscess of limb, unspecified: Secondary | ICD-10-CM

## 2014-05-02 DIAGNOSIS — L03119 Cellulitis of unspecified part of limb: Secondary | ICD-10-CM

## 2014-05-02 DIAGNOSIS — M7989 Other specified soft tissue disorders: Secondary | ICD-10-CM

## 2014-05-02 DIAGNOSIS — R21 Rash and other nonspecific skin eruption: Secondary | ICD-10-CM | POA: Insufficient documentation

## 2014-05-02 MED ORDER — CEPHALEXIN 500 MG PO CAPS: 500.0000 mg | ORAL_CAPSULE | Freq: Three times a day (TID) | ORAL | Status: DC

## 2014-05-02 NOTE — Progress Notes (Signed)
OFFICE NOTE  05/02/2014  CC:  Chief Complaint  Patient presents with  . Leg Swelling    L leg   . Night Sweats    Thursday night into Friday  . Headache     HPI: Patient is a 63 y.o. Caucasian male who is here for several days of left leg splotchy redness from mid thigh down over knee and on LL.  Increased left leg swelling noted over this time.  Pertinent PMH:  Past medical, surgical, social, and family history reviewed and no changes are noted since last office visit.  MEDS:  Outpatient Prescriptions Prior to Visit  Medication Sig Dispense Refill  . ACETAMINOPHEN-BUTALBITAL 50-325 MG TABS TAKE AS DIRECTED AS NEEDED  30 each  5  . aspirin 325 MG tablet Take 325 mg by mouth every morning.       Marland Kitchen. b complex vitamins capsule Take 1 capsule by mouth every morning.      . butalbital-acetaminophen-caffeine (FIORICET WITH CODEINE) 50-325-40-30 MG per capsule 1-2 caps po q6h prn headache  30 capsule  1  . cetirizine (ZYRTEC) 10 MG tablet Take 10 mg by mouth daily.      Marland Kitchen. diltiazem (TAZTIA XT) 360 MG 24 hr capsule TAKE 1 CAPSULE BY MOUTH EVERY DAY AS DIRECTED  90 capsule  1  . furosemide (LASIX) 40 MG tablet Take 1 tablet (40 mg total) by mouth daily.  90 tablet  1  . HYDROcodone-acetaminophen (NORCO) 7.5-325 MG per tablet       . Insulin Lispro Prot & Lispro (HUMALOG MIX 75/25 KWIKPEN) (75-25) 100 UNIT/ML Kwikpen 25 U SQ qAM and 20 U SQ qSupper  15 mL  6  . Insulin Pen Needle (BD ULTRA-FINE PEN NEEDLES) 29G X 12.7MM MISC 1 each by Other route 2 (two) times daily.  200 each  2  . Insulin Pen Needle (BD ULTRA-FINE PEN NEEDLES) 29G X 12.7MM MISC USE TWICE DAILY  200 each  3  . lisinopril (PRINIVIL,ZESTRIL) 40 MG tablet TAKE ONE TABLET BY MOUTH EVERY DAY  90 tablet  1  . metFORMIN (GLUCOPHAGE) 1000 MG tablet TAKE ONE TABLET BY MOUTH TWICE DAILY WITH MEALS  180 tablet  0  . metoprolol (LOPRESSOR) 50 MG tablet 1 and 1/2 tabs po bid  270 tablet  1  . Multiple Vitamin (MULTIVITAMIN) tablet Take  1 tablet by mouth daily.      . nitrofurantoin, macrocrystal-monohydrate, (MACROBID) 100 MG capsule 1 cap po q week prn  30 capsule  1  . saw palmetto 500 MG capsule Take 500 mg by mouth daily.      Marland Kitchen. terazosin (HYTRIN) 10 MG capsule TAKE ONE CAPSULE BY MOUTH ONCE DAILY AT BEDTIME  90 capsule  1  . vitamin E 400 UNIT capsule Take 400 Units by mouth every morning.       No facility-administered medications prior to visit.    PE: Blood pressure 145/82, pulse 102, temperature 97.9 F (36.6 C), temperature source Temporal, resp. rate 20, height 5' 8.75" (1.746 m), weight 387 lb (175.542 kg), SpO2 97.00%. Gen: Alert, well appearing.  Patient is oriented to person, place, time, and situation. Bilat LE 3-4+ pitting edema, worse on left.  Splotchy erythematous macular rash with warmth and tenderness noted from anterior thigh, knee, and LL. R calf circ at 12 below patella is 50 cm L calf circ at 12 cm below patella is 56.5 cm.  IMPRESSION AND PLAN:  Left leg cellulitis, but need to r/o DVT. U/S left  leg set up for today. Keflex 500mg  tid x 10d rx'd. F/u 1 wk in office for recheck.  An After Visit Summary was printed and given to the patient.

## 2014-05-02 NOTE — Progress Notes (Signed)
Pre visit review using our clinic review tool, if applicable. No additional management support is needed unless otherwise documented below in the visit note. 

## 2014-05-03 ENCOUNTER — Other Ambulatory Visit: Payer: Self-pay | Admitting: Family Medicine

## 2014-05-03 MED ORDER — AMOXICILLIN-POT CLAVULANATE 875-125 MG PO TABS: 1.0000 | ORAL_TABLET | Freq: Two times a day (BID) | ORAL | Status: DC

## 2014-05-09 ENCOUNTER — Encounter: Payer: Self-pay | Admitting: Family Medicine

## 2014-05-09 ENCOUNTER — Ambulatory Visit (INDEPENDENT_AMBULATORY_CARE_PROVIDER_SITE_OTHER): Payer: 59 | Admitting: Family Medicine

## 2014-05-09 VITALS — BP 140/80 | HR 80 | Temp 98.0°F | Ht 68.75 in | Wt 394.0 lb

## 2014-05-09 DIAGNOSIS — L02419 Cutaneous abscess of limb, unspecified: Secondary | ICD-10-CM

## 2014-05-09 DIAGNOSIS — L03119 Cellulitis of unspecified part of limb: Secondary | ICD-10-CM

## 2014-05-09 DIAGNOSIS — L03116 Cellulitis of left lower limb: Secondary | ICD-10-CM

## 2014-05-09 NOTE — Progress Notes (Signed)
OFFICE NOTE  05/09/2014  CC:  Chief Complaint  Patient presents with  . Follow-up     HPI: Patient is a 63 y.o. Caucasian male who is here for 1 wk f/u left leg cellulitis. His left LE doppler was neg for DVT last week.  Has been compliant with augmentin and says leg feels back to normal again.  No fever or malaise.  Pertinent PMH:  Past medical, surgical, social, and family history reviewed and no changes are noted since last office visit.  MEDS: Not taking cephalexin listed below Outpatient Prescriptions Prior to Visit  Medication Sig Dispense Refill  . ACETAMINOPHEN-BUTALBITAL 50-325 MG TABS TAKE AS DIRECTED AS NEEDED  30 each  5  . amoxicillin-clavulanate (AUGMENTIN) 875-125 MG per tablet Take 1 tablet by mouth 2 (two) times daily.  20 tablet  0  . aspirin 325 MG tablet Take 325 mg by mouth every morning.       Marland Kitchen. b complex vitamins capsule Take 1 capsule by mouth every morning.      . butalbital-acetaminophen-caffeine (FIORICET WITH CODEINE) 50-325-40-30 MG per capsule 1-2 caps po q6h prn headache  30 capsule  1  . cetirizine (ZYRTEC) 10 MG tablet Take 10 mg by mouth daily.      . Cinnamon 500 MG TABS Take by mouth.      . Coenzyme Q10 (CO Q 10) 10 MG CAPS Take by mouth.      . diltiazem (TAZTIA XT) 360 MG 24 hr capsule TAKE 1 CAPSULE BY MOUTH EVERY DAY AS DIRECTED  90 capsule  1  . furosemide (LASIX) 40 MG tablet Take 1 tablet (40 mg total) by mouth daily.  90 tablet  1  . Insulin Lispro Prot & Lispro (HUMALOG MIX 75/25 KWIKPEN) (75-25) 100 UNIT/ML Kwikpen 25 U SQ qAM and 20 U SQ qSupper  15 mL  6  . Insulin Pen Needle (BD ULTRA-FINE PEN NEEDLES) 29G X 12.7MM MISC 1 each by Other route 2 (two) times daily.  200 each  2  . Insulin Pen Needle (BD ULTRA-FINE PEN NEEDLES) 29G X 12.7MM MISC USE TWICE DAILY  200 each  3  . lisinopril (PRINIVIL,ZESTRIL) 40 MG tablet TAKE ONE TABLET BY MOUTH EVERY DAY  90 tablet  1  . metFORMIN (GLUCOPHAGE) 1000 MG tablet TAKE ONE TABLET BY MOUTH TWICE  DAILY WITH MEALS  180 tablet  0  . metoprolol (LOPRESSOR) 50 MG tablet 1 and 1/2 tabs po bid  270 tablet  1  . Multiple Vitamin (MULTIVITAMIN) tablet Take 1 tablet by mouth daily.      . nitrofurantoin, macrocrystal-monohydrate, (MACROBID) 100 MG capsule 1 cap po q week prn  30 capsule  1  . saw palmetto 500 MG capsule Take 500 mg by mouth daily.      Marland Kitchen. terazosin (HYTRIN) 10 MG capsule TAKE ONE CAPSULE BY MOUTH ONCE DAILY AT BEDTIME  90 capsule  1  . vitamin E 400 UNIT capsule Take 400 Units by mouth every morning.      . cephALEXin (KEFLEX) 500 MG capsule Take 1 capsule (500 mg total) by mouth 3 (three) times daily.  30 capsule  0  . HYDROcodone-acetaminophen (NORCO) 7.5-325 MG per tablet        No facility-administered medications prior to visit.    PE: Blood pressure 140/80, pulse 80, temperature 98 F (36.7 C), temperature source Temporal, height 5' 8.75" (1.746 m), weight 394 lb (178.717 kg), SpO2 94.00%. Gen: Alert, well appearing.  Patient is oriented  to person, place, time, and situation. Left leg without any excess warmth.  No erythema or tenderness of legs.  He has diffuse pinkish and hyperkeratotic skin in both pretibial regions c/w chronic venous stasis rash.  No streaking, no skin breakdown.  Pitting edema 3+ or so (his usual baseline).  IMPRESSION AND PLAN:  Left leg cellulitis: resolving appropriately on augmentin. Finish 10d course of this antibiotic. Signs/symptoms to call or return for were reviewed and pt expressed understanding.  An After Visit Summary was printed and given to the patient.  FOLLOW UP: keep prev scheduled f/u appt (October 2015)

## 2014-05-09 NOTE — Progress Notes (Signed)
Pre visit review using our clinic review tool, if applicable. No additional management support is needed unless otherwise documented below in the visit note. 

## 2014-05-12 ENCOUNTER — Other Ambulatory Visit: Payer: Self-pay | Admitting: Family Medicine

## 2014-05-24 ENCOUNTER — Other Ambulatory Visit: Payer: Self-pay | Admitting: Family Medicine

## 2014-05-25 ENCOUNTER — Other Ambulatory Visit: Payer: Self-pay | Admitting: Family Medicine

## 2014-08-04 ENCOUNTER — Other Ambulatory Visit: Payer: Self-pay | Admitting: Family Medicine

## 2014-08-04 DIAGNOSIS — I482 Chronic atrial fibrillation, unspecified: Secondary | ICD-10-CM

## 2014-08-04 HISTORY — DX: Chronic atrial fibrillation, unspecified: I48.20

## 2014-08-08 ENCOUNTER — Telehealth: Payer: Self-pay | Admitting: *Deleted

## 2014-08-08 DIAGNOSIS — I10 Essential (primary) hypertension: Secondary | ICD-10-CM

## 2014-08-08 DIAGNOSIS — IMO0002 Reserved for concepts with insufficient information to code with codable children: Secondary | ICD-10-CM

## 2014-08-08 DIAGNOSIS — R300 Dysuria: Secondary | ICD-10-CM

## 2014-08-08 DIAGNOSIS — R600 Localized edema: Secondary | ICD-10-CM

## 2014-08-08 MED ORDER — METOPROLOL TARTRATE 50 MG PO TABS: ORAL_TABLET | ORAL | Status: DC

## 2014-08-08 NOTE — Telephone Encounter (Signed)
Rx sent 

## 2014-08-08 NOTE — Telephone Encounter (Signed)
Received request from [facility] to transfer pt's metoprolol prescription to them. Unable to reorder rx due to dx update. Please advise?

## 2014-08-15 ENCOUNTER — Ambulatory Visit (INDEPENDENT_AMBULATORY_CARE_PROVIDER_SITE_OTHER): Payer: 59 | Admitting: Family Medicine

## 2014-08-15 ENCOUNTER — Encounter: Payer: Self-pay | Admitting: Family Medicine

## 2014-08-15 VITALS — BP 121/86 | HR 92 | Temp 97.6°F | Resp 18 | Ht 68.75 in | Wt 382.0 lb

## 2014-08-15 DIAGNOSIS — Z0000 Encounter for general adult medical examination without abnormal findings: Secondary | ICD-10-CM | POA: Insufficient documentation

## 2014-08-15 DIAGNOSIS — E1140 Type 2 diabetes mellitus with diabetic neuropathy, unspecified: Secondary | ICD-10-CM

## 2014-08-15 DIAGNOSIS — I4819 Other persistent atrial fibrillation: Secondary | ICD-10-CM | POA: Insufficient documentation

## 2014-08-15 DIAGNOSIS — I499 Cardiac arrhythmia, unspecified: Secondary | ICD-10-CM

## 2014-08-15 DIAGNOSIS — I4891 Unspecified atrial fibrillation: Secondary | ICD-10-CM

## 2014-08-15 DIAGNOSIS — Z0001 Encounter for general adult medical examination with abnormal findings: Secondary | ICD-10-CM

## 2014-08-15 DIAGNOSIS — Z125 Encounter for screening for malignant neoplasm of prostate: Secondary | ICD-10-CM

## 2014-08-15 LAB — HEMOGLOBIN A1C: Hgb A1c MFr Bld: 7 % — ABNORMAL HIGH (ref 4.6–6.5)

## 2014-08-15 MED ORDER — RIVAROXABAN 20 MG PO TABS: 20.0000 mg | ORAL_TABLET | Freq: Every day | ORAL | Status: DC

## 2014-08-15 MED ORDER — INSULIN LISPRO PROT & LISPRO (75-25 MIX) 100 UNIT/ML KWIKPEN: PEN_INJECTOR | SUBCUTANEOUS | Status: DC

## 2014-08-15 MED ORDER — NITROFURANTOIN MONOHYD MACRO 100 MG PO CAPS: ORAL_CAPSULE | ORAL | Status: DC

## 2014-08-15 NOTE — Assessment & Plan Note (Signed)
Plus hx of nephropathy. Control has been pretty good lately. HbA1c today.

## 2014-08-15 NOTE — Progress Notes (Signed)
Pre visit review using our clinic review tool, if applicable. No additional management support is needed unless otherwise documented below in the visit note. 

## 2014-08-15 NOTE — Progress Notes (Signed)
Office Note 08/15/2014  CC:  Chief Complaint  Patient presents with  . Annual Exam    fasting   . Medication Refill    HPI:  [name] is a 63 y.o. White male who is here for CPE. No acute complaints except he has some left leg sciatica sx's that he may make a return appt to talk about. Compliant with meds. Most recent labs in June 2015 were good/stable--minor up-titration of insulin was done (current 35 U qAM and 32 U qPM of the 75/25 insulin).   Past Medical History  Diagnosis Date  . DIABETES MELLITUS, TYPE II 08/11/2006  . HYPERTENSION 08/11/2006  . ALLERGIC RHINITIS 08/11/2006  . ASTHMA 08/11/2006  . DM W/RENAL MANIFESTATIONS, TYPE II 04/20/2007  . OBESITY, MORBID 12/14/2007    Past Surgical History  Procedure Laterality Date  . Ureteral stent placement    . Virtual colonoscopy  01/2011    Normal  . Transthoracic echocardiogram  11/2007    LV fxn normal, EF normal, mild dilation of left atrium    Family History  Problem Relation Age of Onset  . Diabetes Mother   . Diabetes Sister   . Hydrocephalus Sister     NPH    History   Social History  . Marital Status: Single    Spouse Name: N/A    Number of Children: N/A  . Years of Education: N/A   Occupational History  . Not on file.   Social History Main Topics  . Smoking status: Never Smoker   . Smokeless tobacco: Not on file  . Alcohol Use: No  . Drug Use: No  . Sexual Activity:    Other Topics Concern  . Not on file   Social History Narrative   Single, no children.   [place]orig from [place]   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   No Tobacco, no alc, drugs.   Exercise: no    Diet: no         Not taking augmentin listed below Outpatient Prescriptions Prior to Visit  Medication Sig Dispense Refill  . ACETAMINOPHEN-BUTALBITAL 50-325 MG TABS TAKE AS DIRECTED AS NEEDED  30 each  5  . b complex vitamins capsule Take 1 capsule by mouth every  morning.      . butalbital-acetaminophen-caffeine (FIORICET WITH CODEINE) 50-325-40-30 MG per capsule 1-2 caps po q6h prn headache  30 capsule  1  . cetirizine (ZYRTEC) 10 MG tablet Take 10 mg by mouth daily.      . Cinnamon 500 MG TABS Take by mouth.      . Coenzyme Q10 (CO Q 10) 10 MG CAPS Take by mouth.      . diltiazem (TIAZAC) 360 MG 24 hr capsule Take 1 capsule by mouth  daily as directed  90 capsule  1  . furosemide (LASIX) 40 MG tablet Take 1 tablet by mouth  daily  90 tablet  1  . Insulin Pen Needle (BD ULTRA-FINE PEN NEEDLES) 29G X 12.7MM MISC 1 each by Other route 2 (two) times daily.  200 each  2  . lisinopril (PRINIVIL,ZESTRIL) 40 MG tablet Take 1 tablet by mouth  every day  90 tablet  1  . metFORMIN (GLUCOPHAGE) 1000 MG tablet Take 1 tablet by mouth  twice a day with meals  180 tablet  1  . metoprolol (LOPRESSOR) 50 MG tablet 1 and 1/2 tabs po bid  270 tablet  4  . Multiple Vitamin (MULTIVITAMIN) tablet  Take 1 tablet by mouth daily.      . saw palmetto 500 MG capsule Take 500 mg by mouth daily.      Marland Kitchen. terazosin (HYTRIN) 10 MG capsule Take 1 capsule by mouth  once a day at bedtime  90 capsule  1  . vitamin E 400 UNIT capsule Take 400 Units by mouth every morning.      Marland Kitchen. aspirin 325 MG tablet Take 325 mg by mouth every morning.       Marland Kitchen. HUMALOG MIX 75/25 KWIKPEN (75-25) 100 UNIT/ML Kwikpen Inject subcutaneously 25  units every morning and  Inject subcutaneously 20  units every evening at  supper  45 mL  3  . nitrofurantoin, macrocrystal-monohydrate, (MACROBID) 100 MG capsule 1 cap po q week prn  30 capsule  1  . Insulin Pen Needle (BD ULTRA-FINE PEN NEEDLES) 29G X 12.7MM MISC USE TWICE DAILY  200 each  3  . amoxicillin-clavulanate (AUGMENTIN) 875-125 MG per tablet Take 1 tablet by mouth 2 (two) times daily.  20 tablet  0   No facility-administered medications prior to visit.    No Known Allergies  ROS Review of Systems  Constitutional: Negative for fever, chills, appetite  change and fatigue.  HENT: Negative for congestion, dental problem, ear pain and sore throat.   Eyes: Negative for discharge, redness and visual disturbance.  Respiratory: Negative for cough, chest tightness, shortness of breath and wheezing.   Cardiovascular: Negative for chest pain, palpitations and leg swelling.  Gastrointestinal: Negative for nausea, vomiting, abdominal pain, diarrhea and blood in stool.  Genitourinary: Negative for dysuria, urgency, frequency, hematuria, flank pain and difficulty urinating.  Musculoskeletal: Positive for myalgias (back of left upper leg with pain, esp with lifting). Negative for arthralgias, back pain, joint swelling and neck stiffness.  Skin: Negative for pallor and rash.  Neurological: Negative for dizziness, speech difficulty, weakness and headaches.  Hematological: Negative for adenopathy. Does not bruise/bleed easily.  Psychiatric/Behavioral: Negative for confusion and sleep disturbance. The patient is not nervous/anxious.     PE; Blood pressure 121/86, pulse 92, temperature 97.6 F (36.4 C), temperature source Temporal, resp. rate 18, height 5' 8.75" (1.746 m), weight 382 lb (173.274 kg), SpO2 96.00%. Gen: Alert, well appearing.  Patient is oriented to person, place, time, and situation. AFFECT: pleasant, lucid thought and speech. ENT: Ears: EACs clear, normal epithelium.  TMs with good light reflex and landmarks bilaterally.  Eyes: no injection, icteris, swelling, or exudate.  EOMI, PERRLA. Nose: no drainage or turbinate edema/swelling.  No injection or focal lesion.  Mouth: lips without lesion/swelling.  Oral mucosa pink and moist.  Dentition intact and without obvious caries or gingival swelling.  Oropharynx without erythema, exudate, or swelling.  Neck: supple/nontender.  No LAD, mass, or TM.  Carotid pulses 2+ bilaterally, without bruits. CV: RRR, no m/r/g.   LUNGS: CTA bilat, nonlabored resps, good aeration in all lung fields. ABD: soft, NT,  ND, BS normal.  No hepatospenomegaly or mass.  No bruits. EXT: no clubbing, cyanosis, or edema.  Musculoskeletal: no joint swelling, erythema, warmth, or tenderness.  ROM of all joints intact. Skin - no sores or suspicious lesions or rashes or color changes Rectal exam: negative without mass, lesions or tenderness, PROSTATE EXAM: smooth and symmetric without nodules or tenderness.  Pertinent labs:  12 lead EKG today: atrial fib/flutter, low voltage in limb leads.  No prior EKG for comparison.  ASSESSMENT AND PLAN:   Atrial fibrillation, unspecified New dx today. Ventricular response in  the 80s, patient is asymptomatic. Start xarelto 20mg  qd. Echocardiogram ordered. Cardiology referral ordered. Stop aspirin. No labs today: his TSH 04/2014 was wnl as were his electrolytes/cr.  He has never had a problem with electrolyte imbalance.  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 Control has been pretty good lately. HbA1c today.  Health maintenance examination Reviewed age and gender appropriate health maintenance issues (prudent diet, regular exercise, health risks of tobacco and excessive alcohol, use of seatbelts, fire alarms in home, use of sunscreen).  Also reviewed age and gender appropriate health screening as well as vaccine recommendations. Flu vaccine IM today. Colon cancer screening UTD. Prostate ca screening: DRE today normal.  PSA drawn today.   An After Visit Summary was printed and given to the patient.  FOLLOW UP:  Return in about 4 months (around 12/16/2014).

## 2014-08-15 NOTE — Assessment & Plan Note (Addendum)
New dx today. Ventricular response in the 80s, patient is asymptomatic. Start xarelto 20mg  qd. Echocardiogram ordered. Cardiology referral ordered. Stop aspirin. No labs today: his TSH 04/2014 was wnl as were his electrolytes/cr.  He has never had a problem with electrolyte imbalance.

## 2014-08-15 NOTE — Assessment & Plan Note (Signed)
Reviewed age and gender appropriate health maintenance issues (prudent diet, regular exercise, health risks of tobacco and excessive alcohol, use of seatbelts, fire alarms in home, use of sunscreen).  Also reviewed age and gender appropriate health screening as well as vaccine recommendations. Flu vaccine IM today. Colon cancer screening UTD. Prostate ca screening: DRE today normal.  PSA drawn today.

## 2014-08-16 LAB — PSA: PSA: 0.37 ng/mL (ref 0.10–4.00)

## 2014-08-19 ENCOUNTER — Telehealth: Payer: Self-pay | Admitting: Family Medicine

## 2014-08-19 ENCOUNTER — Other Ambulatory Visit: Payer: Self-pay

## 2014-08-19 NOTE — Telephone Encounter (Signed)
Patient LMOM wanting to know if it's okay for him to start glucosamine.  PLease advise.

## 2014-08-20 NOTE — Telephone Encounter (Signed)
Yes, ok to start glucosamine.

## 2014-08-22 ENCOUNTER — Encounter: Payer: 59 | Admitting: Family Medicine

## 2014-08-22 NOTE — Telephone Encounter (Signed)
Patient aware.

## 2014-08-24 ENCOUNTER — Ambulatory Visit (HOSPITAL_BASED_OUTPATIENT_CLINIC_OR_DEPARTMENT_OTHER): Payer: 59

## 2014-09-05 ENCOUNTER — Encounter: Payer: Self-pay | Admitting: Family Medicine

## 2014-09-05 ENCOUNTER — Ambulatory Visit (HOSPITAL_COMMUNITY)
Admission: RE | Admit: 2014-09-05 | Discharge: 2014-09-05 | Disposition: A | Discharge status: Home / Self Care | Payer: 59 | Source: Ambulatory Visit | Attending: Family Medicine | Admitting: Family Medicine

## 2014-09-05 ENCOUNTER — Ambulatory Visit (HOSPITAL_COMMUNITY): Payer: 59

## 2014-09-05 DIAGNOSIS — I517 Cardiomegaly: Secondary | ICD-10-CM

## 2014-09-05 DIAGNOSIS — I10 Essential (primary) hypertension: Secondary | ICD-10-CM | POA: Diagnosis not present

## 2014-09-05 DIAGNOSIS — I4891 Unspecified atrial fibrillation: Secondary | ICD-10-CM

## 2014-09-05 DIAGNOSIS — E785 Hyperlipidemia, unspecified: Secondary | ICD-10-CM | POA: Diagnosis not present

## 2014-09-05 DIAGNOSIS — E119 Type 2 diabetes mellitus without complications: Secondary | ICD-10-CM | POA: Insufficient documentation

## 2014-09-05 NOTE — Progress Notes (Signed)
Echo Lab  2D Echocardiogram completed.  Loran Auguste L Casie Sturgeon, RDCS 09/05/2014 9:38 AM

## 2014-09-06 ENCOUNTER — Telehealth: Payer: Self-pay

## 2014-09-06 MED ORDER — RIVAROXABAN 20 MG PO TABS: 20.0000 mg | ORAL_TABLET | Freq: Every day | ORAL | Status: DC

## 2014-09-06 NOTE — Telephone Encounter (Signed)
Called tp give pt lab results and he would like to know if he needs to continue taking the Xarelto. If so, please send into mail order pharmacy. Please advise.

## 2014-09-06 NOTE — Telephone Encounter (Signed)
Left detailed message on machine explaining response from Dr Milinda CaveMcGowen. Advised pt to call back if he needed to discuss with me.

## 2014-09-06 NOTE — Telephone Encounter (Signed)
Yes, still needs to take xarelto.  He'll need to stay on this indefinitely. I sent 90d supply with RF's via mail order today.

## 2014-09-16 ENCOUNTER — Other Ambulatory Visit: Payer: Self-pay | Admitting: Family Medicine

## 2014-09-16 ENCOUNTER — Encounter: Payer: Self-pay | Admitting: Family Medicine

## 2014-09-16 MED ORDER — RIVAROXABAN 20 MG PO TABS: 20.0000 mg | ORAL_TABLET | Freq: Every day | ORAL | Status: DC

## 2014-09-19 ENCOUNTER — Encounter: Payer: Self-pay | Admitting: Internal Medicine

## 2014-09-19 ENCOUNTER — Ambulatory Visit (INDEPENDENT_AMBULATORY_CARE_PROVIDER_SITE_OTHER): Payer: 59 | Admitting: Internal Medicine

## 2014-09-19 ENCOUNTER — Other Ambulatory Visit (HOSPITAL_COMMUNITY): Payer: Self-pay | Admitting: Internal Medicine

## 2014-09-19 VITALS — BP 176/74 | HR 82 | Ht 70.0 in | Wt 387.7 lb

## 2014-09-19 DIAGNOSIS — E1121 Type 2 diabetes mellitus with diabetic nephropathy: Secondary | ICD-10-CM

## 2014-09-19 DIAGNOSIS — E1140 Type 2 diabetes mellitus with diabetic neuropathy, unspecified: Secondary | ICD-10-CM

## 2014-09-19 DIAGNOSIS — I480 Paroxysmal atrial fibrillation: Secondary | ICD-10-CM

## 2014-09-19 DIAGNOSIS — E1142 Type 2 diabetes mellitus with diabetic polyneuropathy: Secondary | ICD-10-CM

## 2014-09-19 DIAGNOSIS — I10 Essential (primary) hypertension: Secondary | ICD-10-CM

## 2014-09-19 DIAGNOSIS — I4891 Unspecified atrial fibrillation: Secondary | ICD-10-CM

## 2014-09-19 NOTE — Progress Notes (Signed)
OFFICE NOTE  Chief Complaint:  New onset a-fib  Primary Care Physician: Jeoffrey MassedMCGOWEN,[name] H, MD  HPI:  [name] is a 63 year old male who is currently referred by Dr. Milinda CaveMcGowen for new onset atrial flutter/fibrillation. [name] was at his office for a routine physical and underwent an EKG. This demonstrated either coarse A. Fib or atrial flutter with variable ventricular response and a rate around 80. It was noted that he was already on metoprolol tartrate 75 mg twice daily for hypertension. 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  He is reportedly unaware of his atrial fibrillation. Fortunately an EKG in the office today shows that he is back in a sinus rhythm or sinus arrhythmia. There is no family history of coronary disease or A. Fib. He denies any chest pain or worsening shortness of breath, but has been having problems with sciatic pain in his back and difficulty ambulating. He reports he gets good sleep at night, rarely wakes up, does not have morning headaches or the need for daytime napping. His screening EPWSS was only 3, suggesting that obstructive sleep apnea is actually fairly unlikely, despite his significant weight.  PMHx:  Past Medical History  Diagnosis Date  . DIABETES MELLITUS, TYPE II 08/11/2006  . HYPERTENSION 08/11/2006  . ALLERGIC RHINITIS 08/11/2006  . ASTHMA 08/11/2006  . DM W/RENAL MANIFESTATIONS, TYPE II 04/20/2007  . OBESITY, MORBID 12/14/2007    Past Surgical History  Procedure Laterality Date  . Ureteral stent placement    . Virtual colonoscopy  01/2011    Normal  . Transthoracic echocardiogram  11/2007; 09/2014    LV fxn normal, EF normal, mild dilation of left atrium.  2015 grade II diast dysfxn    FAMHx:  Family History  Problem Relation Age of Onset  . Diabetes Mother   . Diabetes Sister   . Hydrocephalus Sister     NPH     SOCHx:   reports that he has never smoked. He does not have any smokeless tobacco history on file. He reports that he does not drink alcohol or use illicit drugs.  ALLERGIES:  No Known Allergies  ROS: A comprehensive review of systems was negative except for: Cardiovascular: positive for irregular heart beat Musculoskeletal: positive for back pain  HOME MEDS: Current Outpatient Prescriptions  Medication Sig Dispense Refill  . ACETAMINOPHEN-BUTALBITAL 50-325 MG TABS TAKE AS DIRECTED AS NEEDED 30 each 5  . b complex vitamins capsule Take 1 capsule by mouth every morning.    . butalbital-acetaminophen-caffeine (FIORICET WITH CODEINE) 50-325-40-30 MG per capsule 1-2 caps po q6h prn headache 30 capsule 1  . cetirizine (ZYRTEC) 10 MG tablet Take 10 mg by mouth daily.    . Cinnamon 500 MG TABS Take by mouth.    . Coenzyme Q10 (CO Q 10) 10 MG CAPS Take by mouth.    . diltiazem (TIAZAC) 360 MG 24 hr capsule Take 1 capsule by mouth  daily as directed 90 capsule 1  . furosemide (LASIX) 40 MG tablet Take 1 tablet by mouth  daily 90 tablet 1  . Insulin Lispro Prot & Lispro (HUMALOG MIX 75/25 KWIKPEN) (75-25) 100 UNIT/ML Kwikpen 35 U SQ qAM and 32 U SQ qPM 45 mL 3  . Insulin Pen Needle (BD ULTRA-FINE PEN NEEDLES) 29G X 12.7MM MISC 1 each by Other route 2 (two) times daily. 200 each 2  . Insulin Pen Needle (BD  ULTRA-FINE PEN NEEDLES) 29G X 12.7MM MISC USE TWICE DAILY 200 each 3  . lisinopril (PRINIVIL,ZESTRIL) 40 MG tablet Take 1 tablet by mouth  every day 90 tablet 1  . metFORMIN (GLUCOPHAGE) 1000 MG tablet Take 1 tablet by mouth  twice a day with meals 180 tablet 1  . metoprolol (LOPRESSOR) 50 MG tablet 1 and 1/2 tabs po bid 270 tablet 4  . Multiple Vitamin (MULTIVITAMIN) tablet Take 1 tablet by mouth daily.    . nitrofurantoin, macrocrystal-monohydrate, (MACROBID) 100 MG capsule 1 cap po q week prn 12 capsule 4  . rivaroxaban (XARELTO) 20 MG TABS tablet Take 1 tablet (20 mg total) by mouth  daily with supper. 90 tablet 3  . saw palmetto 500 MG capsule Take 500 mg by mouth daily.    Marland Kitchen. terazosin (HYTRIN) 10 MG capsule Take 1 capsule by mouth  once a day at bedtime 90 capsule 1  . vitamin E 400 UNIT capsule Take 400 Units by mouth every morning.     No current facility-administered medications for this visit.    LABS/IMAGING: No results found for this or any previous visit (from the past 48 hour(s)). No results found.  VITALS: BP 176/74 mmHg  Pulse 82  Ht 5\' 10"  (1.778 m)  Wt 387 lb 11.2 oz (175.86 kg)  BMI 55.63 kg/m2  EXAM: General appearance: alert and no distress Neck: no carotid bruit and no JVD Lungs: diminished breath sounds bilaterally Heart: regular rate and rhythm, S1, S2 normal, no murmur, click, rub or gallop Abdomen: morbidly obese, protuberant Extremities: edema 1+ bilateral venous stasis edema Pulses: 2+ and symmetric Skin: Skin color, texture, turgor normal. No rashes or lesions Neurologic: Grossly normal Psych: Pleasant  EKG: Normal sinus with sinus arrhythmia at 82  ASSESSMENT: 1. New onset atrial flutter/fibrillation, paroxysmal 2. Hypertension-question controlled 3. Morbid obesity 4. 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 Dyslipidemia  PLAN: 1.   [name] has paroxysmal atrial flutter/fibrillation for which she is unaware. His rate control has been generally good as he is on additional medications for hypertension including metoprolol. He was started on Xarelto and is not having any issues with that medication. He takes it with meals which I have stressed the importance of for adequate absorption. Possible contributing factors for his atrial fibrillation include obstructive sleep apnea however he has a low sleepiness score and does not describe any apneic symptoms. Ischemia could be another etiology, although he is not describing chest pain. He does have multiple cardiac risk factors. I would recommend a 2 day Lexiscan  nuclear stress test, as he has inability to walk on a treadmill. Finally, his blood pressure was elevated today however he reported not taking his medications this morning. I've asked him to take his medicines prior to his follow-up visit so that we can assess if his blood pressure is at goal. Plan to see him back in follow-up after his studies are completed.  Thank you's office for the kind referral.  Chrystie NoseKenneth C. Avonne Berkery, MD, [facility] Attending Cardiologist [facility]  Landin Tallon C 09/19/2014, 9:04 AM

## 2014-09-19 NOTE — Patient Instructions (Signed)
Your physician has requested that you have a lexiscan myoview. For further information please visit [email or web address]. Please follow instruction sheet, as given.  Your physician recommends that you schedule a follow-up appointment after your stress test with Dr. Hilty.  

## 2014-09-27 ENCOUNTER — Telehealth (HOSPITAL_COMMUNITY): Payer: Self-pay

## 2014-09-27 NOTE — Telephone Encounter (Signed)
Encounter complete. 

## 2014-10-04 ENCOUNTER — Ambulatory Visit (HOSPITAL_COMMUNITY)
Admission: RE | Admit: 2014-10-04 | Discharge: 2014-10-04 | Disposition: A | Discharge status: Home / Self Care | Payer: 59 | Source: Ambulatory Visit | Attending: Cardiovascular Disease | Admitting: Cardiovascular Disease

## 2014-10-04 DIAGNOSIS — Z6841 Body Mass Index (BMI) 40.0 and over, adult: Secondary | ICD-10-CM | POA: Insufficient documentation

## 2014-10-04 DIAGNOSIS — I10 Essential (primary) hypertension: Secondary | ICD-10-CM | POA: Diagnosis present

## 2014-10-04 DIAGNOSIS — Z794 Long term (current) use of insulin: Secondary | ICD-10-CM | POA: Insufficient documentation

## 2014-10-04 DIAGNOSIS — E669 Obesity, unspecified: Secondary | ICD-10-CM | POA: Insufficient documentation

## 2014-10-04 DIAGNOSIS — I480 Paroxysmal atrial fibrillation: Secondary | ICD-10-CM

## 2014-10-04 DIAGNOSIS — I4891 Unspecified atrial fibrillation: Secondary | ICD-10-CM | POA: Diagnosis not present

## 2014-10-04 DIAGNOSIS — E119 Type 2 diabetes mellitus without complications: Secondary | ICD-10-CM | POA: Diagnosis not present

## 2014-10-04 MED ORDER — REGADENOSON 0.4 MG/5ML IV SOLN
0.4000 mg | Freq: Once | INTRAVENOUS | Status: AC
Start: 2014-10-04 — End: 2014-10-04
  Administered 2014-10-04: 0.4 mg via INTRAVENOUS

## 2014-10-04 MED ORDER — TECHNETIUM TC 99M SESTAMIBI GENERIC - CARDIOLITE
30.8000 | Freq: Once | INTRAVENOUS | Status: AC | PRN
  Administered 2014-10-04: 30.8 via INTRAVENOUS

## 2014-10-04 NOTE — Procedures (Addendum)
[facility] [address] [place] [address] [phone]  Cardiology Nuclear Med Study  Kevin FabianJesse James Fissel is a 63 y.o. male     MRN : [id number]     DOB: 09/01/1951  Procedure Date: 10/04/2014  Nuclear Med Background Indication for Stress Test:  Evaluation for Ischemia History: a-Fib,no prior cardiac history, no prior MPI Cardiac Risk Factors: Hypertension, IDDM Type 2, Lipids and Obesity  Symptoms:  DOE   Nuclear Pre-Procedure Caffeine/Decaff Intake:  7:00pm NPO After: 5:00am   IV Site: R Antecubital  IV 0.9% NS with Angio Cath:  22g  Chest Size (in):  XXXL IV Started by: Koren Shiverobin Moffitt, CNMT  Height: 5\' 10"  (1.778 m)  Cup Size: n/a  BMI:  Body mass index is 55.53 kg/(m^2). Weight:  387 lb (175.542 kg)   Tech Comments:  n/a    Nuclear Med Study 1 or 2 day study: 2 day  Stress Test Type:  Lexiscan  Order Authorizing Provider:  Zoila ShutterKenneth Hilty, MD   Resting Radionuclide: Technetium 2773m Sestamibi  Resting Radionuclide Dose: 30.9 mCi   Stress Radionuclide:  Technetium 10073m Sestamibi  Stress Radionuclide Dose: 30.8 mCi           Stress Protocol Rest HR: 60 Stress HR: 68  Rest BP: 166/71 Stress BP: 150/52  Exercise Time (min): n/a METS: n/a   Predicted Max HR: 157 bpm % Max HR: 46.5 bpm Rate Pressure Product: [id number]  Dose of Adenosine (mg):  n/a Dose of Lexiscan: 0.4 mg  Dose of Atropine (mg): n/a Dose of Dobutamine: n/a mcg/kg/min (at max HR)  Stress Test Technologist: Esperanza Sheetserry-Marie Martin, CCT Nuclear Technologist: Koren Shiverobin Moffitt, CNMT   Rest Procedure:  Myocardial perfusion imaging was performed at rest 45 minutes following the intravenous administration of Technetium 6673m Sestamibi. Stress Procedure:  The patient received IV Lexiscan 0.4 mg over 15-seconds.  Technetium 1473m Sestamibi injected IV at 30-seconds.  There were no significant changes with Lexiscan.  Quantitative spect images were obtained after a 45 minute  delay.  Transient Ischemic Dilatation (Normal <1.22):  1.48 QGS EDV:  233 ml QGS ESV:  135 ml LV Ejection Fraction: 42%     Rest ECG: NSR - Normal EKG  Stress ECG: No significant change from baseline ECG  QPS Raw Data Images:  Normal; no motion artifact; normal heart/lung ratio. Stress Images:  There is decreased uptake in the inferior wall. Rest Images:  There is decreased uptake in the inferior wall. Subtraction (SDS):  There is a fixed inferior defect that is most consistent with diaphragmatic attenuation.  Impression Exercise Capacity:  Lexiscan with no exercise. BP Response:  Normal blood pressure response. Clinical Symptoms:  No significant symptoms noted. ECG Impression:  No significant ST segment change suggestive of ischemia. Comparison with Prior Nuclear Study: No images to compare  Overall Impression:  Low risk stress nuclear study Diaphragmatic attenuation, mild LV dilatation.  LV Wall Motion:  Mod LV dysfunction   Runell GessBERRY,Magda Muise J, MD  10/05/2014 11:58 AM

## 2014-10-05 ENCOUNTER — Ambulatory Visit (HOSPITAL_COMMUNITY)
Admission: RE | Admit: 2014-10-05 | Discharge: 2014-10-05 | Disposition: A | Discharge status: Home / Self Care | Payer: 59 | Source: Ambulatory Visit | Attending: Cardiovascular Disease | Admitting: Cardiovascular Disease

## 2014-10-05 DIAGNOSIS — I480 Paroxysmal atrial fibrillation: Secondary | ICD-10-CM | POA: Diagnosis not present

## 2014-10-05 DIAGNOSIS — Z794 Long term (current) use of insulin: Secondary | ICD-10-CM | POA: Diagnosis not present

## 2014-10-05 DIAGNOSIS — I10 Essential (primary) hypertension: Secondary | ICD-10-CM | POA: Insufficient documentation

## 2014-10-05 DIAGNOSIS — E669 Obesity, unspecified: Secondary | ICD-10-CM | POA: Insufficient documentation

## 2014-10-05 DIAGNOSIS — E119 Type 2 diabetes mellitus without complications: Secondary | ICD-10-CM | POA: Insufficient documentation

## 2014-10-05 MED ORDER — TECHNETIUM TC 99M SESTAMIBI GENERIC - CARDIOLITE
30.9000 | Freq: Once | INTRAVENOUS | Status: AC | PRN
  Administered 2014-10-05: 30.9 via INTRAVENOUS

## 2014-10-21 ENCOUNTER — Other Ambulatory Visit: Payer: Self-pay | Admitting: Family Medicine

## 2014-10-24 ENCOUNTER — Ambulatory Visit (INDEPENDENT_AMBULATORY_CARE_PROVIDER_SITE_OTHER): Payer: 59 | Admitting: Internal Medicine

## 2014-10-24 ENCOUNTER — Encounter: Payer: Self-pay | Admitting: Internal Medicine

## 2014-10-24 VITALS — BP 146/65 | HR 57 | Ht 70.0 in | Wt 394.0 lb

## 2014-10-24 DIAGNOSIS — I4891 Unspecified atrial fibrillation: Secondary | ICD-10-CM

## 2014-10-24 DIAGNOSIS — R9439 Abnormal result of other cardiovascular function study: Secondary | ICD-10-CM

## 2014-10-24 DIAGNOSIS — I10 Essential (primary) hypertension: Secondary | ICD-10-CM

## 2014-10-24 NOTE — Patient Instructions (Signed)
Your physician wants you to follow-up in: 1 year with Dr. Hilty. You will receive a reminder letter in the mail two months in advance. If you don't receive a letter, please call our office to schedule the follow-up appointment.  

## 2014-10-24 NOTE — Progress Notes (Signed)
OFFICE NOTE  Chief Complaint:  New onset a-fib  Primary Care Physician: Jeoffrey MassedMCGOWEN,[name] H, MD  HPI:  [name] is a 63 year old male who is currently referred by Dr. Milinda CaveMcGowen for new onset atrial flutter/fibrillation. [name] was at his office for a routine physical and underwent an EKG. This demonstrated either coarse A. Fib or atrial flutter with variable ventricular response and a rate around 80. It was noted that he was already on metoprolol tartrate 75 mg twice daily for hypertension. 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  He is reportedly unaware of his atrial fibrillation. Fortunately an EKG in the office today shows that he is back in a sinus rhythm or sinus arrhythmia. There is no family history of coronary disease or A. Fib. He denies any chest pain or worsening shortness of breath, but has been having problems with sciatic pain in his back and difficulty ambulating. He reports he gets good sleep at night, rarely wakes up, does not have morning headaches or the need for daytime napping. His screening EPWSS was only 3, suggesting that obstructive sleep apnea is actually fairly unlikely, despite his significant weight.  [name] returns today for follow-up. He underwent a nuclear stress test which was a 2 day study. This demonstrated significant TID 1.48, however it is unclear how reliable this could be because of the fact the study was done on 2 different days. The test was negative for ischemia. EF was calculated at 42%, which is lower than his EF was thought to be at 50%. He denies any chest pain, ever. He also has stable shortness of breath but no worsening symptoms. He reportedly was unaware of A. fib and still has not had any symptoms. He is currently on Xarelto and is not having any bleeding problems.  PMHx:  Past Medical History  Diagnosis Date  .  DIABETES MELLITUS, TYPE II 08/11/2006  . HYPERTENSION 08/11/2006  . ALLERGIC RHINITIS 08/11/2006  . ASTHMA 08/11/2006  . DM W/RENAL MANIFESTATIONS, TYPE II 04/20/2007  . OBESITY, MORBID 12/14/2007    Past Surgical History  Procedure Laterality Date  . Ureteral stent placement    . Virtual colonoscopy  01/2011    Normal  . Transthoracic echocardiogram  11/2007; 09/2014    LV fxn normal, EF normal, mild dilation of left atrium.  2015 grade II diast dysfxn    FAMHx:  Family History  Problem Relation Age of Onset  . Diabetes Mother   . Diabetes Sister   . Hydrocephalus Sister     NPH    SOCHx:   reports that he has never smoked. He does not have any smokeless tobacco history on file. He reports that he does not drink alcohol or use illicit drugs.  ALLERGIES:  No Known Allergies  ROS: A comprehensive review of systems was negative except for: Respiratory: positive for dyspnea on exertion  HOME MEDS: Current Outpatient Prescriptions  Medication Sig Dispense Refill  . ACETAMINOPHEN-BUTALBITAL 50-325 MG TABS TAKE AS DIRECTED AS NEEDED 30 each 5  . b complex vitamins capsule Take 1 capsule by mouth every morning.    . butalbital-acetaminophen-caffeine (FIORICET WITH CODEINE) 50-325-40-30 MG per capsule 1-2 caps po q6h prn headache 30 capsule 1  . cetirizine (ZYRTEC) 10 MG tablet Take 10 mg by mouth daily.    . Cinnamon 500 MG TABS Take by mouth.    . Coenzyme Q10 (CO  Q 10) 10 MG CAPS Take by mouth.    . diltiazem (TIAZAC) 360 MG 24 hr capsule Take 1 capsule by mouth  daily as directed   Equivalent to Tiazac 90 capsule 1  . furosemide (LASIX) 40 MG tablet Take 1 tablet by mouth  daily 90 tablet 1  . Insulin Lispro Prot & Lispro (HUMALOG MIX 75/25 KWIKPEN) (75-25) 100 UNIT/ML Kwikpen 35 U SQ qAM and 32 U SQ qPM 45 mL 3  . Insulin Pen Needle (BD ULTRA-FINE PEN NEEDLES) 29G X 12.7MM MISC 1 each by Other route 2 (two) times daily. 200 each 2  . Insulin Pen Needle (BD ULTRA-FINE PEN NEEDLES)  29G X 12.7MM MISC USE TWICE DAILY 200 each 3  . lisinopril (PRINIVIL,ZESTRIL) 40 MG tablet Take 1 tablet by mouth  every day 90 tablet 1  . metFORMIN (GLUCOPHAGE) 1000 MG tablet Take 1 tablet by mouth  twice a day with meals 180 tablet 1  . metoprolol (LOPRESSOR) 50 MG tablet 1 and 1/2 tabs po bid 270 tablet 4  . Multiple Vitamin (MULTIVITAMIN) tablet Take 1 tablet by mouth daily.    . nitrofurantoin, macrocrystal-monohydrate, (MACROBID) 100 MG capsule 1 cap po q week prn 12 capsule 4  . rivaroxaban (XARELTO) 20 MG TABS tablet Take 1 tablet (20 mg total) by mouth daily with supper. 90 tablet 3  . saw palmetto 500 MG capsule Take 500 mg by mouth daily.    Marland Kitchen. terazosin (HYTRIN) 10 MG capsule Take 1 capsule by mouth  once a day at bedtime 90 capsule 1  . vitamin E 400 UNIT capsule Take 400 Units by mouth every morning. Selenium 50mg      No current facility-administered medications for this visit.    LABS/IMAGING: No results found for this or any previous visit (from the past 48 hour(s)). No results found.  VITALS: BP 146/65 mmHg  Pulse 57  Ht 5\' 10"  (1.778 m)  Wt 394 lb (178.717 kg)  BMI 56.53 kg/m2  EXAM: deferred  EKG: deferred  ASSESSMENT: 1. New onset atrial flutter/fibrillation, paroxysmal 2. Hypertension-question controlled 3. Morbid obesity 4. 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 Dyslipidemia  PLAN: 1.   [name] had a nuclear stress test showed which was negative for ischemia, EF was however 42%. It is unclear if this is related to gating abnormality with A. fib. He does have a known EF of 50% by echo. The TID Ratio was elevated, however the rest and stress images were performed on 2 different days, which is likely to negate that comparison. He denies any angina and is not having any worsening shortness of breath. He is currently on good heart failure medications including metoprolol, lisinopril, diltiazem, Lasix and the Xarelto for A. fib.  We  discussed the mild abnormalities and the fact that if we absolutely must know his coronary disease we could consider heart catheterization, however I'm not strongly in favor of it based on the fact that he's not that symptomatic. He is in agreement with this and mentioned that he is not interested in catheterization. We will continue to monitor him clinically and he should look for signs and symptoms of worsening shortness of breath, chest pain or associated features.   Plan to see him back in a year or sooner as necessary.   Chrystie NoseKenneth C. Jesusa Stenerson, MD, [facility] Attending Cardiologist [facility]  Brieonna Crutcher C 10/24/2014, 4:32 PM

## 2014-10-25 ENCOUNTER — Ambulatory Visit (INDEPENDENT_AMBULATORY_CARE_PROVIDER_SITE_OTHER): Payer: 59 | Admitting: Physical Therapy

## 2014-10-25 DIAGNOSIS — R5381 Other malaise: Secondary | ICD-10-CM

## 2014-10-25 DIAGNOSIS — M2550 Pain in unspecified joint: Secondary | ICD-10-CM

## 2014-10-25 DIAGNOSIS — M7072 Other bursitis of hip, left hip: Secondary | ICD-10-CM

## 2014-10-25 DIAGNOSIS — M6281 Muscle weakness (generalized): Secondary | ICD-10-CM

## 2014-10-31 ENCOUNTER — Encounter (INDEPENDENT_AMBULATORY_CARE_PROVIDER_SITE_OTHER): Payer: 59 | Admitting: Physical Therapy

## 2014-10-31 DIAGNOSIS — M2550 Pain in unspecified joint: Secondary | ICD-10-CM

## 2014-10-31 DIAGNOSIS — M6281 Muscle weakness (generalized): Secondary | ICD-10-CM

## 2014-10-31 DIAGNOSIS — M7072 Other bursitis of hip, left hip: Secondary | ICD-10-CM

## 2014-10-31 DIAGNOSIS — R5381 Other malaise: Secondary | ICD-10-CM

## 2014-11-01 ENCOUNTER — Encounter (INDEPENDENT_AMBULATORY_CARE_PROVIDER_SITE_OTHER): Payer: 59 | Admitting: Physical Therapy

## 2014-11-01 DIAGNOSIS — R5381 Other malaise: Secondary | ICD-10-CM

## 2014-11-01 DIAGNOSIS — M6281 Muscle weakness (generalized): Secondary | ICD-10-CM

## 2014-11-01 DIAGNOSIS — M7072 Other bursitis of hip, left hip: Secondary | ICD-10-CM

## 2014-11-01 DIAGNOSIS — M2550 Pain in unspecified joint: Secondary | ICD-10-CM

## 2014-11-07 ENCOUNTER — Encounter (INDEPENDENT_AMBULATORY_CARE_PROVIDER_SITE_OTHER): Payer: 59 | Admitting: Physical Therapy

## 2014-11-07 DIAGNOSIS — M2550 Pain in unspecified joint: Secondary | ICD-10-CM

## 2014-11-07 DIAGNOSIS — M62072 Separation of muscle (nontraumatic), left ankle and foot: Secondary | ICD-10-CM

## 2014-11-07 DIAGNOSIS — M7072 Other bursitis of hip, left hip: Secondary | ICD-10-CM

## 2014-11-07 DIAGNOSIS — R5381 Other malaise: Secondary | ICD-10-CM

## 2014-11-08 ENCOUNTER — Encounter (INDEPENDENT_AMBULATORY_CARE_PROVIDER_SITE_OTHER): Payer: 59 | Admitting: Physical Therapy

## 2014-11-08 DIAGNOSIS — M2550 Pain in unspecified joint: Secondary | ICD-10-CM

## 2014-11-08 DIAGNOSIS — M7072 Other bursitis of hip, left hip: Secondary | ICD-10-CM

## 2014-11-08 DIAGNOSIS — R5381 Other malaise: Secondary | ICD-10-CM

## 2014-11-08 DIAGNOSIS — M6281 Muscle weakness (generalized): Secondary | ICD-10-CM

## 2014-11-14 ENCOUNTER — Encounter (INDEPENDENT_AMBULATORY_CARE_PROVIDER_SITE_OTHER): Payer: 59 | Admitting: Physical Therapy

## 2014-11-14 DIAGNOSIS — M7072 Other bursitis of hip, left hip: Secondary | ICD-10-CM

## 2014-11-14 DIAGNOSIS — R5381 Other malaise: Secondary | ICD-10-CM

## 2014-11-14 DIAGNOSIS — M2550 Pain in unspecified joint: Secondary | ICD-10-CM

## 2014-11-14 DIAGNOSIS — M6281 Muscle weakness (generalized): Secondary | ICD-10-CM

## 2014-11-15 ENCOUNTER — Encounter (INDEPENDENT_AMBULATORY_CARE_PROVIDER_SITE_OTHER): Payer: 59 | Admitting: Physical Therapy

## 2014-11-15 DIAGNOSIS — M2550 Pain in unspecified joint: Secondary | ICD-10-CM

## 2014-11-15 DIAGNOSIS — M6281 Muscle weakness (generalized): Secondary | ICD-10-CM

## 2014-11-15 DIAGNOSIS — M7072 Other bursitis of hip, left hip: Secondary | ICD-10-CM

## 2014-11-21 ENCOUNTER — Encounter (INDEPENDENT_AMBULATORY_CARE_PROVIDER_SITE_OTHER): Payer: 59 | Admitting: Physical Therapy

## 2014-11-21 DIAGNOSIS — M2550 Pain in unspecified joint: Secondary | ICD-10-CM

## 2014-11-21 DIAGNOSIS — R5381 Other malaise: Secondary | ICD-10-CM

## 2014-11-21 DIAGNOSIS — M7072 Other bursitis of hip, left hip: Secondary | ICD-10-CM

## 2014-11-22 ENCOUNTER — Encounter (INDEPENDENT_AMBULATORY_CARE_PROVIDER_SITE_OTHER): Payer: 59 | Admitting: Physical Therapy

## 2014-11-22 DIAGNOSIS — M2550 Pain in unspecified joint: Secondary | ICD-10-CM

## 2014-11-22 DIAGNOSIS — M6281 Muscle weakness (generalized): Secondary | ICD-10-CM

## 2014-11-22 DIAGNOSIS — R5381 Other malaise: Secondary | ICD-10-CM

## 2014-11-22 DIAGNOSIS — M7072 Other bursitis of hip, left hip: Secondary | ICD-10-CM

## 2014-11-30 ENCOUNTER — Other Ambulatory Visit: Payer: Self-pay | Admitting: Family Medicine

## 2014-12-05 ENCOUNTER — Ambulatory Visit (INDEPENDENT_AMBULATORY_CARE_PROVIDER_SITE_OTHER): Payer: 59 | Admitting: Family Medicine

## 2014-12-05 ENCOUNTER — Encounter: Payer: 59 | Admitting: Physical Therapy

## 2014-12-05 ENCOUNTER — Encounter: Payer: Self-pay | Admitting: Family Medicine

## 2014-12-05 VITALS — BP 175/81 | HR 84 | Temp 98.6°F | Resp 22 | Ht 68.75 in | Wt 365.0 lb

## 2014-12-05 DIAGNOSIS — R221 Localized swelling, mass and lump, neck: Secondary | ICD-10-CM

## 2014-12-05 DIAGNOSIS — E118 Type 2 diabetes mellitus with unspecified complications: Secondary | ICD-10-CM

## 2014-12-05 LAB — BASIC METABOLIC PANEL
BUN: 15 mg/dL (ref 6–23)
CHLORIDE: 101 meq/L (ref 96–112)
CO2: 32 mEq/L (ref 19–32)
Calcium: 8.8 mg/dL (ref 8.4–10.5)
Creatinine, Ser: 1.03 mg/dL (ref 0.40–1.50)
GFR: 77.36 mL/min (ref >60.00)
GLUCOSE: 154 mg/dL — AB (ref 70–99)
POTASSIUM: 4.4 meq/L (ref 3.5–5.1)
Sodium: 139 mEq/L (ref 135–145)

## 2014-12-05 LAB — CBC WITH DIFFERENTIAL/PLATELET
BASOS PCT: 0.8 % (ref 0.0–3.0)
Basophils Absolute: 0.1 10*3/uL (ref 0.0–0.1)
Eosinophils Absolute: 0.3 10*3/uL (ref 0.0–0.7)
Eosinophils Relative: 4.2 % (ref 0.0–5.0)
HCT: 40.5 % (ref 39.0–52.0)
Hemoglobin: 13.6 g/dL (ref 13.0–17.0)
LYMPHS ABS: 1.6 10*3/uL (ref 0.7–4.0)
LYMPHS PCT: 23.3 % (ref 12.0–46.0)
MCHC: 33.5 g/dL (ref 30.0–36.0)
MCV: 87.1 fl (ref 78.0–100.0)
MONO ABS: 0.7 10*3/uL (ref 0.1–1.0)
MONOS PCT: 9.9 % (ref 3.0–12.0)
Neutro Abs: 4.1 10*3/uL (ref 1.4–7.7)
Neutrophils Relative %: 61.8 % (ref 43.0–77.0)
PLATELETS: 221 10*3/uL (ref 150.0–400.0)
RBC: 4.65 Mil/uL (ref 4.22–5.81)
RDW: 14.6 % (ref 11.5–15.5)
WBC: 6.7 10*3/uL (ref 4.0–10.5)

## 2014-12-05 NOTE — Progress Notes (Signed)
Pre visit review using our clinic review tool, if applicable. No additional management support is needed unless otherwise documented below in the visit note. 

## 2014-12-05 NOTE — Progress Notes (Signed)
OFFICE NOTE  12/05/2014  CC:  Chief Complaint  Patient presents with  . Mass    R side of neck, just noticed Saturday   HPI: Patient is a 64 y.o. Caucasian male who is here for "mass on right side of neck".  Noted 2 d/a when rubbing his neck, feels like a knot.  Nontender.  No swallowing difficulty. Has been going to PT for the left hamstring strain he has and he thinks this may be responsible for his wt drop of 17 lbs since I last saw him 08/15/14.   Pertinent PMH:  Past medical, surgical, social, and family history reviewed and no changes are noted since last office visit.  MEDS:  Outpatient Prescriptions Prior to Visit  Medication Sig Dispense Refill  . ACETAMINOPHEN-BUTALBITAL 50-325 MG TABS TAKE AS DIRECTED AS NEEDED 30 each 5  . b complex vitamins capsule Take 1 capsule by mouth every morning.    . butalbital-acetaminophen-caffeine (FIORICET WITH CODEINE) 50-325-40-30 MG per capsule 1-2 caps po q6h prn headache 30 capsule 1  . cetirizine (ZYRTEC) 10 MG tablet Take 10 mg by mouth daily.    . Cinnamon 500 MG TABS Take by mouth.    . Coenzyme Q10 (CO Q 10) 10 MG CAPS Take by mouth.    . diltiazem (TIAZAC) 360 MG 24 hr capsule Take 1 capsule by mouth  daily as directed   Equivalent to Tiazac 90 capsule 1  . furosemide (LASIX) 40 MG tablet Take 1 tablet by mouth  daily 90 tablet 1  . Insulin Lispro Prot & Lispro (HUMALOG MIX 75/25 KWIKPEN) (75-25) 100 UNIT/ML Kwikpen 35 U SQ qAM and 32 U SQ qPM 45 mL 3  . Insulin Pen Needle (BD ULTRA-FINE PEN NEEDLES) 29G X 12.7MM MISC 1 each by Other route 2 (two) times daily. 200 each 2  . Insulin Pen Needle (BD ULTRA-FINE PEN NEEDLES) 29G X 12.7MM MISC USE TWICE DAILY 200 each 3  . lisinopril (PRINIVIL,ZESTRIL) 40 MG tablet Take 1 tablet by mouth  every day 90 tablet 1  . metFORMIN (GLUCOPHAGE) 1000 MG tablet Take 1 tablet by mouth  twice a day with meals 180 tablet 1  . metoprolol (LOPRESSOR) 50 MG tablet 1 and 1/2 tabs po bid 270 tablet 4  .  Multiple Vitamin (MULTIVITAMIN) tablet Take 1 tablet by mouth daily.    . nitrofurantoin, macrocrystal-monohydrate, (MACROBID) 100 MG capsule 1 cap po q week prn 12 capsule 4  . rivaroxaban (XARELTO) 20 MG TABS tablet Take 1 tablet (20 mg total) by mouth daily with supper. 90 tablet 3  . saw palmetto 500 MG capsule Take 500 mg by mouth daily.    Marland Kitchen. terazosin (HYTRIN) 10 MG capsule Take 1 capsule by mouth  once a day at bedtime 90 capsule 1  . vitamin E 400 UNIT capsule Take 400 Units by mouth every morning. Selenium 50mg      No facility-administered medications prior to visit.    PE: Blood pressure 175/81, pulse 84, temperature 98.6 F (37 C), temperature source Temporal, resp. rate 22, height 5' 8.75" (1.746 m), weight 365 lb (165.563 kg), SpO2 96 %. ZHG:DJMEENT:Eyes: no injection, icteris, swelling, or exudate.  EOMI, PERRLA. Mouth: lips without lesion/swelling.  Oral mucosa pink and moist. Oropharynx without erythema, exudate, or swelling.  Neck: supple/nontender.  No LAD or TM.  Carotid pulses 2+ bilaterally, without bruits. At right Sternoclavicular junction there is a soft, 2 cm oval subQ nodule that is nontender and nonpulsatile.   Supraclavicular  regions w/out mass or induration.   IMPRESSION AND PLAN:  Check neck soft tissue u/s first. Obtain CBC w/diff and BMET. If u/s not informative, will proceed with CT soft tissue neck with contrast. Pt informed of plan and is in agreement/understands.  An After Visit Summary was printed and given to the patient.  FOLLOW UP: To be determined based on results of pending workup.

## 2014-12-06 ENCOUNTER — Ambulatory Visit (INDEPENDENT_AMBULATORY_CARE_PROVIDER_SITE_OTHER): Payer: 59

## 2014-12-06 ENCOUNTER — Encounter (INDEPENDENT_AMBULATORY_CARE_PROVIDER_SITE_OTHER): Payer: 59 | Admitting: Physical Therapy

## 2014-12-06 DIAGNOSIS — M7072 Other bursitis of hip, left hip: Secondary | ICD-10-CM

## 2014-12-06 DIAGNOSIS — M6281 Muscle weakness (generalized): Secondary | ICD-10-CM

## 2014-12-06 DIAGNOSIS — M2550 Pain in unspecified joint: Secondary | ICD-10-CM

## 2014-12-06 DIAGNOSIS — R5381 Other malaise: Secondary | ICD-10-CM

## 2014-12-06 DIAGNOSIS — R221 Localized swelling, mass and lump, neck: Secondary | ICD-10-CM

## 2014-12-12 ENCOUNTER — Ambulatory Visit: Payer: 59 | Admitting: Family Medicine

## 2015-01-18 ENCOUNTER — Other Ambulatory Visit: Payer: Self-pay | Admitting: Family Medicine

## 2015-01-20 ENCOUNTER — Other Ambulatory Visit: Payer: Self-pay | Admitting: Family Medicine

## 2015-01-20 MED ORDER — BUTALBITAL-APAP-CAFF-COD 50-325-40-30 MG PO CAPS: ORAL_CAPSULE | ORAL | Status: DC

## 2015-01-20 NOTE — Telephone Encounter (Signed)
Refill request for but/ace/caf Last filled by MD on- 10/27/14 #30 x1 Last Appt: 12/05/2014 Next Appt: none Please advise refill?

## 2015-02-01 ENCOUNTER — Encounter: Payer: Self-pay | Admitting: Family Medicine

## 2015-02-01 ENCOUNTER — Telehealth: Payer: Self-pay | Admitting: Family Medicine

## 2015-02-01 NOTE — Telephone Encounter (Signed)
Done

## 2015-02-01 NOTE — Telephone Encounter (Signed)
Refill request came from patient for fioricet.  It looks like this was already filled.  Can you check to see if it was faxed?

## 2015-02-13 ENCOUNTER — Encounter: Payer: Self-pay | Admitting: Nurse Practitioner

## 2015-02-13 ENCOUNTER — Ambulatory Visit (INDEPENDENT_AMBULATORY_CARE_PROVIDER_SITE_OTHER): Payer: 59 | Admitting: Nurse Practitioner

## 2015-02-13 VITALS — BP 155/72 | HR 95 | Temp 97.6°F | Ht 68.75 in | Wt 386.0 lb

## 2015-02-13 DIAGNOSIS — I878 Other specified disorders of veins: Secondary | ICD-10-CM | POA: Diagnosis not present

## 2015-02-13 DIAGNOSIS — L03115 Cellulitis of right lower limb: Secondary | ICD-10-CM

## 2015-02-13 MED ORDER — DOXYCYCLINE HYCLATE 100 MG PO TABS: ORAL_TABLET | ORAL | Status: DC

## 2015-02-13 NOTE — Progress Notes (Signed)
Pre visit review using our clinic review tool, if applicable. No additional management support is needed unless otherwise documented below in the visit note. 

## 2015-02-13 NOTE — Patient Instructions (Signed)
Please start antibiotic. Start warm water soaks once daily with 1 cup Braggs vinegar added to water. Perform lower leg exercises every hour (10 to 20 reps) to increase circulation. Also Walk 5 minutes, 4 times daily.  Check blood sugars for best control. PLease make diet changes: cut out sweet foods except fresh fruits, berries, & melons. All grains-breads, cereals, pasta, & rice should have 4 grams fiber or more per serving. Read labels for best nutrtion.  Please return in 1 week or sooner if you have concerns.  Stasis Ulcer Stasis ulcers occur in the legs when the circulation is damaged. An ulcer may look like a small hole in the skin.  CAUSES Stasis ulcers occur because your veins do not work properly. Veins have valves that help the blood return to the heart. If these valves do not work right, blood flows backwards and backs up into the veins near the skin. This condition causes the veins to become larger because of increased pressure and may lead to a stasis ulcer. SYMPTOMS   Shallow (superficial) sore on the leg.  Clear drainage or weeping from the sore.  Leg pain or a feeling of heaviness. This may be worse at the end of the day.  Leg swelling.  Skin color changes. DIAGNOSIS  Your caregiver will make a diagnosis by examining your leg. Your caregiver may order tests such as an ultrasound or other studies to evaluate the blood flow of the leg. HOME CARE INSTRUCTIONS   Do not stand or sit in one position for long periods of time. Do not sit with your legs crossed. Rest with your legs raised during the day. If possible, it is best if you can elevate your legs above your heart for 30 minutes, 3 to 4 times a day.  Wear elastic stockings or support hose. Do not wear other tight encircling garments around legs, pelvis, or waist. This causes increased pressure in your veins. If your caregiver has applied compressive medicated wraps, use them as instructed.  Walk as much as possible to  increase blood flow. If you are taking long rides in a car or plane, take a break to walk around every 2 hours. If not already on aspirin, take a baby aspirin before long trips unless you have medical reasons that prohibit this.  Raise the foot of your bed at night with 2-inch blocks if approved by your caregiver. This may not be desirable if you have heart failure or breathing problems.  If you get a cut in the skin over the vein and the vein bleeds, lie down with your leg raised and gently clean the area with a clean cloth. Apply pressure on the cut until the bleeding stops. Then place a dressing on the cut. See your caregiver if it continues to bleed or needs stitches. Also, see your caregiver if you develop an infection.Signs of an infection include a fever, redness, increased pain, and drainage of pus.  If your caregiver has given you a follow-up appointment, it is very important to keep that appointment. Not keeping the appointment could result in a chronic or permanent injury, pain, and disability. If there is any problem keeping the appointment, call your caregiver for assistance. SEEK IMMEDIATE MEDICAL CARE IF:  The ulcer area starts to break down.  You have pain, redness, tenderness, pus, or hard swelling in your leg over a vein or near the ulcer.  Your leg pain is uncomfortable.  You develop an unexplained fever.  You develop chest  pain or shortness of breath. Document Released: 07/16/2001 Document Revised: 01/13/2012 Document Reviewed: 02/10/2011 [facility] Patient Information 2015 [place], [place]. This information is not intended to replace advice given to you by your health care provider. Make sure you discuss any questions you have with your health care provider.

## 2015-02-14 DIAGNOSIS — I878 Other specified disorders of veins: Secondary | ICD-10-CM | POA: Insufficient documentation

## 2015-02-14 NOTE — Progress Notes (Signed)
Subjective:     [name] is a 64 y.o. male here for evaluation of skin ulcer. The ulcer is located on the R LE. Ulcer has been present for several days. Pain is rated as 3/10. Interventions to date: noneRF: DM, venous stasis, obesity, kidney disease, sedentary. This is recurrent problem. He denies fever. Discussed multiple contributing factors to recurrent cellulitis including : sedentary, obesity, DM. Encourage pt to make diet changes, increase exercise. Demonstrated exercises he can do in chair. Suggests 4, 5 minute walks daily. The following portions of the patient's history were reviewed and updated as appropriate: allergies, current medications, past medical history, past social history, past surgical history and problem list.  Review of Systems Pertinent items are noted in HPI.    Objective:    BP 155/72 mmHg  Pulse 95  Temp(Src) 97.6 F (36.4 C) (Oral)  Ht 5' 8.75" (1.746 m)  Wt 386 lb (175.088 kg)  BMI 57.43 kg/m2  SpO2 94% General appearance: alert, cooperative, appears stated age, morbidly obese and walks slowly with walker Head: Normocephalic, without obvious abnormality, atraumatic Eyes: negative findings: lids and lashes normal and conjunctivae and sclerae normal Extremities: edema +3 pitting bilat LE and venous stasis dermatitis noted Skin: lichenification, erythema, hyperpigmentation, dusky discoloration bilat LE. R LE has increased erythema & 2 areas of weeping. Neurologic: Grossly normal    Assessment:Plan  1. Cellulitis of right lower extremity Multi modal treatment involving increasing exercise, wt loss, tight BS control, skin care, compression stockings - doxycycline (VIBRA-TABS) 100 MG tablet; Take 2T po, then 1T po q12hr.  Dispense: 21 tablet; Refill: 0 Vinegar soaks See pt instructions 2. Venous stasis of both lower extremities  F/u 1 week

## 2015-02-15 ENCOUNTER — Encounter: Payer: Self-pay | Admitting: Family Medicine

## 2015-02-15 ENCOUNTER — Other Ambulatory Visit: Payer: Self-pay | Admitting: Nurse Practitioner

## 2015-02-15 DIAGNOSIS — I872 Venous insufficiency (chronic) (peripheral): Secondary | ICD-10-CM

## 2015-02-15 MED ORDER — CEPHALEXIN 500 MG PO CAPS: 500.0000 mg | ORAL_CAPSULE | Freq: Three times a day (TID) | ORAL | Status: DC

## 2015-02-15 NOTE — Progress Notes (Signed)
Called and informed patient. 

## 2015-02-20 ENCOUNTER — Ambulatory Visit: Payer: 59 | Admitting: Family Medicine

## 2015-03-20 ENCOUNTER — Other Ambulatory Visit: Payer: Self-pay | Admitting: *Deleted

## 2015-03-20 MED ORDER — BUTALBITAL-APAP-CAFF-COD 50-325-40-30 MG PO CAPS: ORAL_CAPSULE | ORAL | Status: DC

## 2015-03-20 NOTE — Telephone Encounter (Signed)
[facility] refill on BUT/APAP/CAF Codeine Cap take 1-2 cap po every 6 hours as needed for headache. LOV 08/15/14, no up coming apt, written: 01/20/15 w/ 2RF. Please review. Thanks.

## 2015-03-23 ENCOUNTER — Telehealth: Payer: Self-pay | Admitting: Family Medicine

## 2015-03-23 MED ORDER — INSULIN LISPRO PROT & LISPRO (75-25 MIX) 100 UNIT/ML KWIKPEN: PEN_INJECTOR | SUBCUTANEOUS | Status: DC

## 2015-03-23 NOTE — Telephone Encounter (Signed)
Pateint's Humalog was damaged in the mail. He has worked it out with the mail order to replace it but in the mean time he needs to [facility] a [facility] day supply [facility] [address], [place] to last him until the 90 day supply shows up.

## 2015-03-23 NOTE — Telephone Encounter (Signed)
Humalog ordered per pt request.

## 2015-04-02 ENCOUNTER — Other Ambulatory Visit: Payer: Self-pay | Admitting: Family Medicine

## 2015-04-13 ENCOUNTER — Encounter: Payer: Self-pay | Admitting: Family Medicine

## 2015-04-14 ENCOUNTER — Other Ambulatory Visit: Payer: Self-pay | Admitting: Family Medicine

## 2015-04-14 MED ORDER — BUTALBITAL-APAP-CAFF-COD 50-325-40-30 MG PO CAPS: ORAL_CAPSULE | ORAL | Status: DC

## 2015-04-18 ENCOUNTER — Ambulatory Visit (INDEPENDENT_AMBULATORY_CARE_PROVIDER_SITE_OTHER): Payer: 59 | Admitting: Family Medicine

## 2015-04-18 ENCOUNTER — Ambulatory Visit: Payer: 59 | Admitting: Family Medicine

## 2015-04-18 ENCOUNTER — Encounter: Payer: Self-pay | Admitting: Family Medicine

## 2015-04-18 VITALS — BP 142/79 | HR 106 | Temp 98.5°F | Resp 16 | Wt 385.0 lb

## 2015-04-18 DIAGNOSIS — R509 Fever, unspecified: Secondary | ICD-10-CM | POA: Diagnosis not present

## 2015-04-18 DIAGNOSIS — L03116 Cellulitis of left lower limb: Secondary | ICD-10-CM

## 2015-04-18 DIAGNOSIS — I8311 Varicose veins of right lower extremity with inflammation: Secondary | ICD-10-CM | POA: Diagnosis not present

## 2015-04-18 DIAGNOSIS — I872 Venous insufficiency (chronic) (peripheral): Secondary | ICD-10-CM

## 2015-04-18 MED ORDER — CEPHALEXIN 500 MG PO CAPS: 500.0000 mg | ORAL_CAPSULE | Freq: Three times a day (TID) | ORAL | Status: DC

## 2015-04-18 MED ORDER — CEFTRIAXONE SODIUM 1 G IJ SOLR
1.0000 g | Freq: Once | INTRAMUSCULAR | Status: AC
  Administered 2015-04-18: 1 g via INTRAMUSCULAR

## 2015-04-18 NOTE — Progress Notes (Signed)
Pre visit review using our clinic review tool, if applicable. No additional management support is needed unless otherwise documented below in the visit note. 

## 2015-04-18 NOTE — Progress Notes (Signed)
OFFICE NOTE  04/18/2015  CC:  Chief Complaint  Patient presents with  . Leg Swelling    left lower leg x 3 days   HPI: Patient is a 64 y.o. Caucasian male who is here for left lower leg swelling and redness, onset 3-4 days ago. After having BF the day before it started, he felt some mild stomach queeziness chills/malaise.  The next day he noted the redness and swelling in left LL.  +Pain in L LL.  No RF's for thrombosis lately, plus he is on xarelto for a- fib.  Some subjective fevers over last few days. He started leftover doxycycline 2 days ago. His glucoses have been up more lately than his usual (254 one day recently).   Pertinent PMH:  Past medical, surgical, social, and family history reviewed and no changes are noted since last office visit.  MEDS:  Outpatient Prescriptions Prior to Visit  Medication Sig Dispense Refill  . ACETAMINOPHEN-BUTALBITAL 50-325 MG TABS TAKE AS DIRECTED AS NEEDED 30 each 5  . b complex vitamins capsule Take 1 capsule by mouth every morning.    . butalbital-acetaminophen-caffeine (FIORICET WITH CODEINE) 50-325-40-30 MG per capsule 1-2 caps po q6h prn headache 90 capsule 1  . cetirizine (ZYRTEC) 10 MG tablet Take 10 mg by mouth daily.    . Cinnamon 500 MG TABS Take by mouth.    . Coenzyme Q10 (CO Q 10) 10 MG CAPS Take by mouth.    . Diclofenac Sodium (PENNSAID TD) Place onto the skin.    Marland Kitchen diltiazem (TIAZAC) 360 MG 24 hr capsule Take 1 capsule by mouth  daily as directed   Equivalent to Tiazac 90 capsule 1  . furosemide (LASIX) 40 MG tablet Take 1 tablet by mouth  daily 90 tablet 1  . Insulin Lispro Prot & Lispro (HUMALOG MIX 75/25 KWIKPEN) (75-25) 100 UNIT/ML Kwikpen Inject subcutaneously 25 units every morning and 20  units every evening at  supper 15 mL 0  . Insulin Pen Needle (BD ULTRA-FINE PEN NEEDLES) 29G X 12.7MM MISC 1 each by Other route 2 (two) times daily. 200 each 2  . Insulin Pen Needle (BD ULTRA-FINE PEN NEEDLES) 29G X 12.7MM MISC USE  TWICE DAILY 200 each 3  . lisinopril (PRINIVIL,ZESTRIL) 40 MG tablet Take 1 tablet by mouth  every day 90 tablet 1  . metFORMIN (GLUCOPHAGE) 1000 MG tablet Take 1 tablet by mouth  twice a day with meals 180 tablet 1  . metoprolol (LOPRESSOR) 50 MG tablet 1 and 1/2 tabs po bid 270 tablet 4  . Multiple Vitamin (MULTIVITAMIN) tablet Take 1 tablet by mouth daily.    . nitrofurantoin, macrocrystal-monohydrate, (MACROBID) 100 MG capsule 1 cap po q week prn 12 capsule 4  . rivaroxaban (XARELTO) 20 MG TABS tablet Take 1 tablet (20 mg total) by mouth daily with supper. 90 tablet 3  . saw palmetto 500 MG capsule Take 500 mg by mouth daily.    Marland Kitchen terazosin (HYTRIN) 10 MG capsule Take 1 capsule by mouth  once a day at bedtime 90 capsule 1  . vitamin E 400 UNIT capsule Take 400 Units by mouth every morning. Selenium     . cephALEXin (KEFLEX) 500 MG capsule Take 1 capsule (500 mg total) by mouth 3 (three) times daily. 30 capsule 0   No facility-administered medications prior to visit.    PE: Blood pressure 142/79, pulse 106, temperature 98.5 F (36.9 C), temperature source Oral, resp. rate 16, weight 385 lb (174.635 kg),  SpO2 96 %. Gen: Alert, well appearing.  Patient is oriented to person, place, time, and situation. AFFECT: pleasant, lucid thought and speech. R LL with 2+ pitting edema and bronzing/scarring of pretibial region. L LL with 3+ pitting edema, with diffuse erythema, warmth, and induration of skin. Tender to touch in calf but o/w not much tenderness.  LABS: Lab Results  Component Value Date   HGBA1C 7.0* 08/15/2014    Lab Results  Component Value Date   TSH 2.62 04/25/2014   Lab Results  Component Value Date   WBC 6.7 12/05/2014   HGB 13.6 12/05/2014   HCT 40.5 12/05/2014   MCV 87.1 12/05/2014   PLT 221.0 12/05/2014   Lab Results  Component Value Date   CREATININE 1.03 12/05/2014   BUN 15 12/05/2014   NA 139 12/05/2014   K 4.4 12/05/2014   CL 101 12/05/2014   CO2 32  12/05/2014   Lab Results  Component Value Date   ALT 14 04/25/2014   AST 18 04/25/2014   ALKPHOS 56 04/25/2014   BILITOT 0.9 04/25/2014   Lab Results  Component Value Date   CHOL 122 04/25/2014   Lab Results  Component Value Date   HDL 33.90* 04/25/2014   Lab Results  Component Value Date   LDLCALC 68 04/25/2014   Lab Results  Component Value Date   TRIG 103.0 04/25/2014   Lab Results  Component Value Date   CHOLHDL 4 04/25/2014   Lab Results  Component Value Date   PSA 0.37 08/15/2014   IMPRESSION AND PLAN:  Left lower extremity cellulitis, with some mild systemic symptoms.  He looks well today. Rocephin 1 g IM in office today. Start keflex 500 mg tid x 10d.  Stop doxy. Signs/symptoms to call or return for were reviewed and pt expressed understanding.  An After Visit Summary was printed and given to the patient.  FOLLOW UP: 6-7d

## 2015-04-19 ENCOUNTER — Other Ambulatory Visit: Payer: Self-pay | Admitting: Family Medicine

## 2015-04-19 NOTE — Telephone Encounter (Signed)
LOV: 04/18/15 (acute visit) 08/15/14 (CPE), Up coming ov f/u on acute visit on 04/24/15. 1) terazosin (last written: 10/21/14 #90 w/ 1RF), 2) diltiazem (last written: 10/21/14 #90 w/ 1RF), 3) furosemide (last written: 10/21/14 #90 w/ 1RF) Pt is due for f/u on chronic problems. Please advise. Thanks.

## 2015-04-24 ENCOUNTER — Encounter: Payer: Self-pay | Admitting: Nurse Practitioner

## 2015-04-24 ENCOUNTER — Ambulatory Visit (INDEPENDENT_AMBULATORY_CARE_PROVIDER_SITE_OTHER): Payer: 59 | Admitting: Nurse Practitioner

## 2015-04-24 VITALS — BP 143/84 | HR 100 | Temp 97.5°F | Resp 22 | Ht 68.75 in | Wt 391.0 lb

## 2015-04-24 DIAGNOSIS — I8312 Varicose veins of left lower extremity with inflammation: Secondary | ICD-10-CM | POA: Diagnosis not present

## 2015-04-24 DIAGNOSIS — I8311 Varicose veins of right lower extremity with inflammation: Secondary | ICD-10-CM

## 2015-04-24 DIAGNOSIS — I872 Venous insufficiency (chronic) (peripheral): Secondary | ICD-10-CM

## 2015-04-24 NOTE — Progress Notes (Signed)
Subjective:     [name] is a 64 y.o. male here for f/u of a skin ulcer. The ulcer is located on the posterior L lower leg. Ulcer is chronic & has been present for 10 days. Pain is rated as 3/10. Interventions to date: started on antibiotics 1 week ago. Patient denies tobacco use. Patient has diabetes. He is morbidly obese and sedentary. He shows me pictures of leg taken at initial OV for comparison. He is unable to soak legs as cannot get out of tub. He does not have shower chair or wand.   The following portions of the patient's history were reviewed and updated as appropriate: allergies, current medications, past medical history, past social history, past surgical history and problem list.  Review of Systems Constitutional: negative for fevers Integument/breast: weeping back of leg. pt feels leg looks better since starting abx    Objective:    BP 143/84 mmHg  Pulse 100  Temp(Src) 97.5 F (36.4 C) (Temporal)  Resp 22  Ht 5' 8.75" (1.746 m)  Wt 391 lb (177.356 kg)  BMI 58.18 kg/m2  SpO2 94% General appearance: alert, cooperative, appears stated age and morbidly obese Eyes: negative findings: lids and lashes normal, conjunctivae and sclerae normal and wears glasses Extremities: +3 edema bilat LE, dusky discoloration bilat LE, L posterior leg has 2 areas of weeping. Skin is indurated, hard posterior bilat LE. Anterior L shin is less red when compared to picture. Pulses: 2+ and symmetric Skin: see above. Skin cleaned where weeping. bacitracin & band aid applied.    Assessment:Plan  1. Venous stasis dermatitis of both lower extremities Cont abx Heat therapy Vinegar wash Shower chair & wand - Ambulatory referral to [facility]  Ret in 2 wks if unable to see wound clinic in 2 weeks.

## 2015-04-24 NOTE — Patient Instructions (Signed)
Please continue antibiotic.  Perform lower extremity exercises multiple times throughout the day to improve circulation.  Use heat in evenings to improve circulation, if you cannot soak legs, use heating pad & elevation.  Continue to wear compression stockings.  Please get shower chair & shower wand. [facility] can help with this. Keep receipt & prescription. Submit to [facility].  You may use vinegar wash on draining wound twice daily. Please go to wound clinic for ongoing evaluation. They will call with appointment.  If you have not been able to get appointment in 2 weeks, please see [place] in 2 weeks.

## 2015-04-24 NOTE — Progress Notes (Signed)
Pre visit review using our clinic review tool, if applicable. No additional management support is needed unless otherwise documented below in the visit note. 

## 2015-04-28 ENCOUNTER — Encounter (HOSPITAL_BASED_OUTPATIENT_CLINIC_OR_DEPARTMENT_OTHER): Payer: 59 | Attending: Plastic Surgery

## 2015-04-28 DIAGNOSIS — I499 Cardiac arrhythmia, unspecified: Secondary | ICD-10-CM | POA: Insufficient documentation

## 2015-04-28 DIAGNOSIS — Z794 Long term (current) use of insulin: Secondary | ICD-10-CM | POA: Insufficient documentation

## 2015-04-28 DIAGNOSIS — M109 Gout, unspecified: Secondary | ICD-10-CM | POA: Insufficient documentation

## 2015-04-28 DIAGNOSIS — Z833 Family history of diabetes mellitus: Secondary | ICD-10-CM | POA: Insufficient documentation

## 2015-04-28 DIAGNOSIS — I878 Other specified disorders of veins: Secondary | ICD-10-CM | POA: Insufficient documentation

## 2015-04-28 DIAGNOSIS — E119 Type 2 diabetes mellitus without complications: Secondary | ICD-10-CM | POA: Insufficient documentation

## 2015-04-28 DIAGNOSIS — Z79899 Other long term (current) drug therapy: Secondary | ICD-10-CM | POA: Insufficient documentation

## 2015-04-28 DIAGNOSIS — I10 Essential (primary) hypertension: Secondary | ICD-10-CM | POA: Insufficient documentation

## 2015-04-28 DIAGNOSIS — Z872 Personal history of diseases of the skin and subcutaneous tissue: Secondary | ICD-10-CM | POA: Insufficient documentation

## 2015-04-28 DIAGNOSIS — I739 Peripheral vascular disease, unspecified: Secondary | ICD-10-CM | POA: Insufficient documentation

## 2015-05-01 ENCOUNTER — Other Ambulatory Visit: Payer: Self-pay

## 2015-05-01 DIAGNOSIS — Z872 Personal history of diseases of the skin and subcutaneous tissue: Secondary | ICD-10-CM | POA: Diagnosis not present

## 2015-05-01 DIAGNOSIS — Z833 Family history of diabetes mellitus: Secondary | ICD-10-CM | POA: Diagnosis not present

## 2015-05-01 DIAGNOSIS — I878 Other specified disorders of veins: Secondary | ICD-10-CM | POA: Diagnosis present

## 2015-05-01 DIAGNOSIS — I739 Peripheral vascular disease, unspecified: Secondary | ICD-10-CM | POA: Diagnosis not present

## 2015-05-01 DIAGNOSIS — I499 Cardiac arrhythmia, unspecified: Secondary | ICD-10-CM | POA: Diagnosis not present

## 2015-05-01 DIAGNOSIS — M109 Gout, unspecified: Secondary | ICD-10-CM | POA: Diagnosis not present

## 2015-05-01 DIAGNOSIS — Z794 Long term (current) use of insulin: Secondary | ICD-10-CM | POA: Diagnosis not present

## 2015-05-01 DIAGNOSIS — E119 Type 2 diabetes mellitus without complications: Secondary | ICD-10-CM | POA: Diagnosis not present

## 2015-05-01 DIAGNOSIS — I10 Essential (primary) hypertension: Secondary | ICD-10-CM | POA: Diagnosis not present

## 2015-05-01 DIAGNOSIS — Z79899 Other long term (current) drug therapy: Secondary | ICD-10-CM | POA: Diagnosis not present

## 2015-05-01 LAB — GLUCOSE, CAPILLARY: Glucose-Capillary: 142 mg/dL — ABNORMAL HIGH (ref 65–99)

## 2015-05-10 ENCOUNTER — Telehealth: Payer: Self-pay | Admitting: *Deleted

## 2015-05-10 DIAGNOSIS — G8929 Other chronic pain: Secondary | ICD-10-CM

## 2015-05-10 DIAGNOSIS — R519 Headache, unspecified: Secondary | ICD-10-CM | POA: Insufficient documentation

## 2015-05-10 DIAGNOSIS — R51 Headache: Principal | ICD-10-CM

## 2015-05-10 NOTE — Telephone Encounter (Signed)
Pt called stating that he needs a PA for Xarelto and fioricet. PA has been started for both.

## 2015-05-11 NOTE — Telephone Encounter (Signed)
Fax from [facility] came in for both medications stating that they are on plan's list of covered drugs. Pt advised and voiced understanding. He stated that he received an email from [facility] stating that medications have been shipped out.

## 2015-05-16 ENCOUNTER — Other Ambulatory Visit: Payer: Self-pay | Admitting: *Deleted

## 2015-05-16 ENCOUNTER — Encounter: Payer: Self-pay | Admitting: Family Medicine

## 2015-05-16 MED ORDER — RIVAROXABAN 20 MG PO TABS: 20.0000 mg | ORAL_TABLET | Freq: Every day | ORAL | Status: DC

## 2015-06-05 ENCOUNTER — Ambulatory Visit (INDEPENDENT_AMBULATORY_CARE_PROVIDER_SITE_OTHER): Payer: 59 | Admitting: Family Medicine

## 2015-06-05 ENCOUNTER — Encounter: Payer: Self-pay | Admitting: Family Medicine

## 2015-06-05 VITALS — BP 140/70 | HR 103 | Temp 97.9°F | Resp 18 | Ht 68.75 in | Wt >= 6400 oz

## 2015-06-05 DIAGNOSIS — I878 Other specified disorders of veins: Secondary | ICD-10-CM | POA: Diagnosis not present

## 2015-06-05 DIAGNOSIS — E0840 Diabetes mellitus due to underlying condition with diabetic neuropathy, unspecified: Secondary | ICD-10-CM | POA: Diagnosis not present

## 2015-06-05 DIAGNOSIS — E1140 Type 2 diabetes mellitus with diabetic neuropathy, unspecified: Secondary | ICD-10-CM | POA: Diagnosis not present

## 2015-06-05 DIAGNOSIS — I4891 Unspecified atrial fibrillation: Secondary | ICD-10-CM

## 2015-06-05 DIAGNOSIS — I10 Essential (primary) hypertension: Secondary | ICD-10-CM

## 2015-06-05 DIAGNOSIS — E1149 Type 2 diabetes mellitus with other diabetic neurological complication: Secondary | ICD-10-CM | POA: Insufficient documentation

## 2015-06-05 DIAGNOSIS — R635 Abnormal weight gain: Secondary | ICD-10-CM

## 2015-06-05 LAB — LIPID PANEL
Cholesterol: 105 mg/dL (ref 0–200)
HDL: 37.9 mg/dL — AB (ref >39.00)
LDL Cholesterol: 56 mg/dL (ref 0–99)
NonHDL: 67.33
Total CHOL/HDL Ratio: 3
Triglycerides: 58 mg/dL (ref 0.0–149.0)
VLDL: 11.6 mg/dL (ref 0.0–40.0)

## 2015-06-05 LAB — COMPREHENSIVE METABOLIC PANEL
ALT: 12 U/L (ref 0–53)
AST: 12 U/L (ref 0–37)
Albumin: 3.6 g/dL (ref 3.5–5.2)
Alkaline Phosphatase: 54 U/L (ref 39–117)
BUN: 14 mg/dL (ref 6–23)
CALCIUM: 8.7 mg/dL (ref 8.4–10.5)
CHLORIDE: 101 meq/L (ref 96–112)
CO2: 29 meq/L (ref 19–32)
Creatinine, Ser: 1.12 mg/dL (ref 0.40–1.50)
GFR: 70.12 mL/min (ref >60.00)
Glucose, Bld: 161 mg/dL — ABNORMAL HIGH (ref 70–99)
Potassium: 4.7 mEq/L (ref 3.5–5.1)
SODIUM: 139 meq/L (ref 135–145)
TOTAL PROTEIN: 6.4 g/dL (ref 6.0–8.3)
Total Bilirubin: 0.9 mg/dL (ref 0.2–1.2)

## 2015-06-05 LAB — TSH: TSH: 3.22 u[IU]/mL (ref 0.35–4.50)

## 2015-06-05 LAB — HEMOGLOBIN A1C: HEMOGLOBIN A1C: 6.7 % — AB (ref 4.6–6.5)

## 2015-06-05 LAB — MICROALBUMIN / CREATININE URINE RATIO
CREATININE, U: 264.7 mg/dL
Microalb Creat Ratio: 5.7 mg/g (ref 0.0–30.0)
Microalb, Ur: 15 mg/dL — ABNORMAL HIGH (ref 0.0–1.9)

## 2015-06-05 NOTE — Progress Notes (Signed)
Office Note 06/05/2015  CC:  Chief Complaint  Patient presents with  . Follow-up    2 week f/u.    HPI:  [name] is a 64 y.o. White male who is here for f/u DM 2, HTN, and PAF. Has had some probs with his chronic LE venous stasis-- dermatitis+ cellulitis over the last 6 weeks or so.  DM: tests gluc monthly, yesterday was 121. No hypoglycemia.  Feet: chronic numbness all over both.  No burning/pain.  No home bp monitoring at home: has no bp cuff.  CV: no palpitations or racing heart or dizziness.  He is not exercising, having trouble with ongoing wt gain.  Sits at a desk all day at work. Walks around about half the time with assistance of cane in order to "help me get moving and get balance initially". Sometimes his breathing feels more labored lately due to his weight.  No signifi cough.    Past Medical History  Diagnosis Date  . DIABETES MELLITUS, TYPE II 08/11/2006  . HYPERTENSION 08/11/2006  . ALLERGIC RHINITIS 08/11/2006  . ASTHMA 08/11/2006  . DM W/RENAL MANIFESTATIONS, TYPE II 04/20/2007  . OBESITY, MORBID 12/14/2007  . Chronic atrial fibrillation 08/2014    Past Surgical History  Procedure Laterality Date  . Ureteral stent placement    . Virtual colonoscopy  01/2011    Normal  . Transthoracic echocardiogram  11/2007; 09/2014    LV fxn normal, EF normal, mild dilation of left atrium.  2015 grade II diast dysfxn    Family History  Problem Relation Age of Onset  . Diabetes Mother   . Diabetes Sister   . Hydrocephalus Sister     NPH    History   Social History  . Marital Status: Single    Spouse Name: N/A  . Number of Children: N/A  . Years of Education: N/A   Occupational History  . Not on file.   Social History Main Topics  . Smoking status: Never Smoker   . Smokeless tobacco: Not on file  . Alcohol Use: No  . Drug Use: No  . Sexual Activity: Not on file   Other Topics Concern  . Not on file   Social History Narrative   Single, no  children.   Lives in [place] since 1985 [place] from [place]).   Occupation: Science writer for [employer].   No Tobacco, no alc, drugs.   Exercise: no    Diet: no          Outpatient Prescriptions Prior to Visit  Medication Sig Dispense Refill  . ACETAMINOPHEN-BUTALBITAL 50-325 MG TABS TAKE AS DIRECTED AS NEEDED 30 each 5  . b complex vitamins capsule Take 1 capsule by mouth every morning.    . butalbital-acetaminophen-caffeine (FIORICET WITH CODEINE) 50-325-40-30 MG per capsule 1-2 caps po q6h prn headache 90 capsule 1  . cetirizine (ZYRTEC) 10 MG tablet Take 10 mg by mouth daily.    . Cinnamon 500 MG TABS Take by mouth.    . Coenzyme Q10 (CO Q 10) 10 MG CAPS Take by mouth.    . Diclofenac Sodium (PENNSAID TD) Place onto the skin.    Marland Kitchen diltiazem (TIAZAC) 360 MG 24 hr capsule TAKE 1 CAPSULE BY MOUTH  DAILY AS DIRECTED  EQUIVALENT TO TIAZAC 90 capsule 3  . furosemide (LASIX) 40 MG tablet Take 1 tablet by mouth  daily 90 tablet 3  . Insulin Lispro Prot & Lispro (HUMALOG MIX 75/25 KWIKPEN) (75-25) 100 UNIT/ML Kwikpen Inject  subcutaneously 25 units every morning and 20  units every evening at  supper 15 mL 0  . Insulin Pen Needle (BD ULTRA-FINE PEN NEEDLES) 29G X 12.7MM MISC 1 each by Other route 2 (two) times daily. 200 each 2  . Insulin Pen Needle (BD ULTRA-FINE PEN NEEDLES) 29G X 12.7MM MISC USE TWICE DAILY 200 each 3  . lisinopril (PRINIVIL,ZESTRIL) 40 MG tablet Take 1 tablet by mouth  every day 90 tablet 1  . metFORMIN (GLUCOPHAGE) 1000 MG tablet Take 1 tablet by mouth  twice a day with meals 180 tablet 1  . metoprolol (LOPRESSOR) 50 MG tablet 1 and 1/2 tabs po bid 270 tablet 4  . Multiple Vitamin (MULTIVITAMIN) tablet Take 1 tablet by mouth daily.    . rivaroxaban (XARELTO) 20 MG TABS tablet Take 1 tablet (20 mg total) by mouth daily with supper. 14 tablet 0  . saw palmetto 500 MG capsule Take 500 mg by mouth daily.    Marland Kitchen terazosin (HYTRIN) 10 MG capsule TAKE 1 CAPSULE BY  MOUTH  ONCE A DAY AT BEDTIME 90 capsule 3  . vitamin E 400 UNIT capsule Take 400 Units by mouth every morning. Selenium 50mg     . cephALEXin (KEFLEX) 500 MG capsule Take 1 capsule (500 mg total) by mouth 3 (three) times daily. (Patient not taking: Reported on 06/05/2015) 30 capsule 0  . nitrofurantoin, macrocrystal-monohydrate, (MACROBID) 100 MG capsule 1 cap po q week prn (Patient not taking: Reported on 06/05/2015) 12 capsule 4   No facility-administered medications prior to visit.    No Known Allergies  ROS Review of Systems  Constitutional: Negative for fever and fatigue.  HENT: Negative for congestion and sore throat.   Eyes: Negative for visual disturbance.  Respiratory: Negative for cough.   Cardiovascular: Negative for chest pain.  Gastrointestinal: Negative for nausea and abdominal pain.  Genitourinary: Negative for dysuria.  Musculoskeletal: Negative for back pain and joint swelling.  Skin: Negative for rash.  Neurological: Negative for weakness and headaches.  Hematological: Negative for adenopathy.    PE; Blood pressure 140/70, pulse 103, temperature 97.9 F (36.6 C), temperature source Oral, resp. rate 18, height 5' 8.75" (1.746 m), weight 400 lb (181.439 kg), SpO2 93 %. BP recheck 140/76 (manual) Gen: Alert, well appearing, morbidly obese-appearing.  Patient is oriented to person, place, time, and situation. Foot exam - No deformity; diffuse swelling/edema.  No tenderness, no skin or vascular lesions. Color and temperature is normal. Sensation is overall intact to monofilament testing but a bit diminished in 4th and 5th toes bilaterally.  Peripheral pulses are palpable. Toenails are thick and a bit long.  Skin is peeling superficially on plantar surfaces.   Pertinent labs:  Lab Results  Component Value Date   TSH 2.62 04/25/2014   Lab Results  Component Value Date   WBC 6.7 12/05/2014   HGB 13.6 12/05/2014   HCT 40.5 12/05/2014   MCV 87.1 12/05/2014   PLT 221.0  12/05/2014   Lab Results  Component Value Date   CREATININE 1.03 12/05/2014   BUN 15 12/05/2014   NA 139 12/05/2014   K 4.4 12/05/2014   CL 101 12/05/2014   CO2 32 12/05/2014   Lab Results  Component Value Date   ALT 14 04/25/2014   AST 18 04/25/2014   ALKPHOS 56 04/25/2014   BILITOT 0.9 04/25/2014   Lab Results  Component Value Date   CHOL 122 04/25/2014   Lab Results  Component Value Date   HDL  33.90* 04/25/2014   Lab Results  Component Value Date   LDLCALC 68 04/25/2014   Lab Results  Component Value Date   TRIG 103.0 04/25/2014   Lab Results  Component Value Date   CHOLHDL 4 04/25/2014   Lab Results  Component Value Date   PSA 0.37 08/15/2014   Lab Results  Component Value Date   HGBA1C 7.0* 08/15/2014    ASSESSMENT AND PLAN:   1) DM 2; noncompliant with home monitoring due to $ constraints. Feet exam done today--mild DPN. Check A1c today.  Urine microalb/cr today. He is reminded of need for eye exam.  2) Hyperlipidemia: FLP today, AST/ALT today.  3) Hypertension; The current medical regimen is effective;  continue present plan and medications.  4) PAF: stable.  The current medical regimen is effective;  continue present plan and medications.  5) Morbid obesity, wt gain lately due to inactivity (positive calorie balance): reiterated exercise need. He swears he has not changed anything with his diet.  Check TSH today.  An After Visit Summary was printed and given to the patient.  FOLLOW UP:  Return in about 3 months (around 09/05/2015).

## 2015-06-05 NOTE — Progress Notes (Signed)
Pre visit review using our clinic review tool, if applicable. No additional management support is needed unless otherwise documented below in the visit note. 

## 2015-06-28 ENCOUNTER — Other Ambulatory Visit: Payer: Self-pay | Admitting: Family Medicine

## 2015-06-28 NOTE — Telephone Encounter (Signed)
RF request for xarelto LOV: 06/05/15 Next ov: 09/11/15 Last written: 05/16/15 #14 w/ 0RF

## 2015-07-27 ENCOUNTER — Other Ambulatory Visit: Payer: Self-pay | Admitting: Family Medicine

## 2015-07-27 NOTE — Telephone Encounter (Signed)
RF request for lisinopril LOV: 06/05/15 Next ov: 09/11/15 Last written: 11/30/14 #90 w/ 1RF

## 2015-08-04 ENCOUNTER — Ambulatory Visit: Payer: 59 | Admitting: Family Medicine

## 2015-08-07 ENCOUNTER — Ambulatory Visit: Payer: 59 | Admitting: Family Medicine

## 2015-09-11 ENCOUNTER — Encounter: Payer: Self-pay | Admitting: Family Medicine

## 2015-09-11 ENCOUNTER — Ambulatory Visit (INDEPENDENT_AMBULATORY_CARE_PROVIDER_SITE_OTHER): Payer: 59 | Admitting: Family Medicine

## 2015-09-11 VITALS — BP 132/70 | HR 85 | Temp 98.0°F | Resp 16 | Ht 68.75 in | Wt 399.0 lb

## 2015-09-11 DIAGNOSIS — I10 Essential (primary) hypertension: Secondary | ICD-10-CM

## 2015-09-11 DIAGNOSIS — E118 Type 2 diabetes mellitus with unspecified complications: Secondary | ICD-10-CM | POA: Diagnosis not present

## 2015-09-11 DIAGNOSIS — Z23 Encounter for immunization: Secondary | ICD-10-CM

## 2015-09-11 DIAGNOSIS — I480 Paroxysmal atrial fibrillation: Secondary | ICD-10-CM

## 2015-09-11 LAB — POCT GLYCOSYLATED HEMOGLOBIN (HGB A1C): Hemoglobin A1C: 6.3

## 2015-09-11 NOTE — Progress Notes (Signed)
Pre visit review using our clinic review tool, if applicable. No additional management support is needed unless otherwise documented below in the visit note. 

## 2015-09-11 NOTE — Progress Notes (Signed)
OFFICE VISIT  09/11/2015   CC:  Chief Complaint  Patient presents with  . Follow-up    Pt is not fasting.      HPI:    Patient is a 64 y.o. Caucasian male who presents for 3 mo f/u DM 2, HTN, PAF, chronic LE venous insufficiency edema with frequent stasis dermatitis +/- cellulitis.  Home glucose checks: went through a bout of dep/anx and wasn't eating so he lost wt (which he has now gained back) and during that time he had to stop insulin due to hypoglycemia occ. Now random gluc range 100-135:  Using insulin only "as needed".  20-30 Units avg use.  There are stretches of 1-2 days at a time that he doesn't use his insulin.  Still taking metformin.  No home bp monitoring.  No palpitations, no CP, no SOB.  Taking xarelto, no melena or notable bleeding elsewhere.    Past Medical History  Diagnosis Date  . DIABETES MELLITUS, TYPE II 08/11/2006  . HYPERTENSION 08/11/2006  . ALLERGIC RHINITIS 08/11/2006  . ASTHMA 08/11/2006  . DM W/RENAL MANIFESTATIONS, TYPE II 04/20/2007  . OBESITY, MORBID 12/14/2007  . Chronic atrial fibrillation (HCC) 08/2014    Past Surgical History  Procedure Laterality Date  . Ureteral stent placement    . Virtual colonoscopy  01/2011    Normal  . Transthoracic echocardiogram  11/2007; 09/2014    LV fxn normal, EF normal, mild dilation of left atrium.  2015 grade II diast dysfxn    Outpatient Prescriptions Prior to Visit  Medication Sig Dispense Refill  . ACETAMINOPHEN-BUTALBITAL 50-325 MG TABS TAKE AS DIRECTED AS NEEDED 30 each 5  . b complex vitamins capsule Take 1 capsule by mouth every morning.    . butalbital-acetaminophen-caffeine (FIORICET WITH CODEINE) 50-325-40-30 MG per capsule 1-2 caps po q6h prn headache 90 capsule 1  . cetirizine (ZYRTEC) 10 MG tablet Take 10 mg by mouth daily.    . Cinnamon 500 MG TABS Take by mouth.    . Coenzyme Q10 (CO Q 10) 10 MG CAPS Take by mouth.    . Diclofenac Sodium (PENNSAID TD) Place onto the skin.    Marland Kitchen diltiazem  (TIAZAC) 360 MG 24 hr capsule TAKE 1 CAPSULE BY MOUTH  DAILY AS DIRECTED  EQUIVALENT TO TIAZAC 90 capsule 3  . furosemide (LASIX) 40 MG tablet Take 1 tablet by mouth  daily 90 tablet 3  . Insulin Lispro Prot & Lispro (HUMALOG MIX 75/25 KWIKPEN) (75-25) 100 UNIT/ML Kwikpen Inject subcutaneously 25 units every morning and 20  units every evening at  supper 15 mL 0  . Insulin Pen Needle (BD ULTRA-FINE PEN NEEDLES) 29G X 12.7MM MISC 1 each by Other route 2 (two) times daily. 200 each 2  . Insulin Pen Needle (BD ULTRA-FINE PEN NEEDLES) 29G X 12.7MM MISC USE TWICE DAILY 200 each 3  . lisinopril (PRINIVIL,ZESTRIL) 40 MG tablet Take 1 tablet by mouth  every day 90 tablet 3  . metFORMIN (GLUCOPHAGE) 1000 MG tablet Take 1 tablet by mouth  twice a day with meals 180 tablet 1  . metoprolol (LOPRESSOR) 50 MG tablet 1 and 1/2 tabs po bid 270 tablet 4  . Multiple Vitamin (MULTIVITAMIN) tablet Take 1 tablet by mouth daily.    . rivaroxaban (XARELTO) 20 MG TABS tablet Take 1 tablet (20 mg total) by mouth daily with supper. 14 tablet 0  . saw palmetto 500 MG capsule Take 500 mg by mouth daily.    Marland Kitchen terazosin (HYTRIN)  10 MG capsule TAKE 1 CAPSULE BY MOUTH  ONCE A DAY AT BEDTIME 90 capsule 3  . vitamin E 400 UNIT capsule Take 400 Units by mouth every morning. Selenium 50mg     . XARELTO 20 MG TABS tablet Take 1 tablet (20 mg total) by mouth daily with supper. 90 tablet 1   No facility-administered medications prior to visit.    No Known Allergies  ROS As per HPI  PE: Blood pressure 132/70, pulse 85, temperature 98 F (36.7 C), temperature source Oral, resp. rate 16, height 5' 8.75" (1.746 m), weight 399 lb (180.985 kg), SpO2 95 %. Gen: Alert, well appearing.  Patient is oriented to person, place, time, and situation. CV: Irreg irreg rhythm, rate 75 or so, no m/r/g Chest is clear, no wheezing or rales. Normal symmetric air entry throughout both lung fields. No chest wall deformities or tenderness.   LABS:   Lab Results  Component Value Date   TSH 3.22 06/05/2015   Lab Results  Component Value Date   WBC 6.7 12/05/2014   HGB 13.6 12/05/2014   HCT 40.5 12/05/2014   MCV 87.1 12/05/2014   PLT 221.0 12/05/2014   Lab Results  Component Value Date   CREATININE 1.12 06/05/2015   BUN 14 06/05/2015   NA 139 06/05/2015   K 4.7 06/05/2015   CL 101 06/05/2015   CO2 29 06/05/2015   Lab Results  Component Value Date   ALT 12 06/05/2015   AST 12 06/05/2015   ALKPHOS 54 06/05/2015   BILITOT 0.9 06/05/2015   Lab Results  Component Value Date   CHOL 105 06/05/2015   Lab Results  Component Value Date   HDL 37.90* 06/05/2015   Lab Results  Component Value Date   LDLCALC 56 06/05/2015   Lab Results  Component Value Date   TRIG 58.0 06/05/2015   Lab Results  Component Value Date   CHOLHDL 3 06/05/2015   Lab Results  Component Value Date   PSA 0.37 08/15/2014   Lab Results  Component Value Date   HGBA1C 6.3 09/11/2015    IMPRESSION AND PLAN:  1) DM 2, on/off use of insulin. POC A1c today was 6.3%.  He'll continue current treatment measures. Reminded pt again of being overdue for diab retpthy screening exam.  2) HTN: The current medical regimen is effective;  continue present plan and medications.  3) PAF; doing well on rate control + anticoag with xarelto. Keep cardiology f/u recommended.  An After Visit Summary was printed and given to the patient.  FOLLOW UP: Return in about 3 months (around 12/12/2015) for annual CPE (fasting)+DRE/PSA.

## 2015-09-14 ENCOUNTER — Other Ambulatory Visit: Payer: Self-pay | Admitting: Family Medicine

## 2015-09-14 NOTE — Telephone Encounter (Signed)
RF request for metoprolol LOV: 09/11/15 Next ov: 12/11/15 Last written: 04/04/15 #180 w/ 1RF

## 2015-09-18 ENCOUNTER — Other Ambulatory Visit: Payer: Self-pay | Admitting: Family Medicine

## 2015-09-18 NOTE — Telephone Encounter (Signed)
LOV: 09/11/15 NOV: 12/11/15  RF request for humalog Last written: 03/23/15 15ml w/ 0RF  RF request for metformin Last written: 03/07/15 #180 w/ 1RF

## 2015-10-01 ENCOUNTER — Other Ambulatory Visit: Payer: Self-pay | Admitting: Family Medicine

## 2015-10-02 NOTE — Telephone Encounter (Signed)
Diane, will you check with [facility] to see if pt has been there and if so pls request records.-thx

## 2015-10-02 NOTE — Telephone Encounter (Signed)
Noted  

## 2015-10-02 NOTE — Telephone Encounter (Signed)
Spoke to pt he stated that he does want this sent to mail order [facility]. He stated that it has been many years since he has seen a urologist. He stated that he can not remember the urologist's name. He stated that he was part of [place] and the office was in [place]. He stated that he still has the ureteral stent.

## 2015-10-02 NOTE — Telephone Encounter (Signed)
OK, I'll rx this. However, ask what urologist he sees b/c I need to get their records. Confirm with him that he still has a ureteral stent in place (or did he just have one put in and then taken out in the past??).  Let me know-thx

## 2015-10-02 NOTE — Telephone Encounter (Signed)
I've sent a fax to [facility] requesting records

## 2015-10-02 NOTE — Telephone Encounter (Signed)
Also, what local pharmacy (I assume he does not want this sent to his mail-order pharmacy)?

## 2015-10-02 NOTE — Telephone Encounter (Signed)
This medication is not currently on pts medication list. He stated that Dr. Cato MulliganSwords prescriped this medication as needed because his sten gets infected occasionally. Please advise. Thanks.

## 2015-10-06 ENCOUNTER — Encounter: Payer: Self-pay | Admitting: Family Medicine

## 2015-10-06 ENCOUNTER — Ambulatory Visit (INDEPENDENT_AMBULATORY_CARE_PROVIDER_SITE_OTHER): Payer: 59 | Admitting: Family Medicine

## 2015-10-06 DIAGNOSIS — L03116 Cellulitis of left lower limb: Secondary | ICD-10-CM | POA: Diagnosis not present

## 2015-10-06 MED ORDER — CEFTRIAXONE SODIUM 1 G IJ SOLR
1.0000 g | INTRAMUSCULAR | Status: DC
  Administered 2015-10-06: 1 g via INTRAMUSCULAR

## 2015-10-06 MED ORDER — CEPHALEXIN 500 MG PO CAPS: 500.0000 mg | ORAL_CAPSULE | Freq: Three times a day (TID) | ORAL | Status: DC

## 2015-10-06 NOTE — Patient Instructions (Signed)
We will treat with the same regimen you have had responded to in the past. Continue to rest, elevate and wear compression stockings to leg. Take antibiotic as prescribed.  If worsening symptoms (fever, nausea,vomit, unable to tolerate meds), increased swelling, redness or pain you should be seen urgently. Your leg should improve and NOT worsen after today... Any signs of worsening be seen immediately. Monitor your sugars closely as infection can increase your glucose.  Follow up in 5 days with PCP.

## 2015-10-06 NOTE — Progress Notes (Signed)
   Subjective:    Patient ID: [name], male    DOB: 10/20/1951, 64 y.o.   MRN: [id number]  HPI  Cellulitis: pt presents with complaints of left lower extremity edema and erythema. Pt states he has had multiple cellulitis flares in the past few years. He responded well to kelfex TIDx10 days, after 1g Rocephin IM last time (june 2016). Pt feels this infection is not as bad as prior and he feels he caught this in hits infancy. Pt noticed yesterday his compression hose were tighter and then he noticed the swelling and redness. He wears his compression hose all day usually and is attempting to keep his foot elevated.  He has noticed some low grade fever and chills in which he took some tylenol, with good resolution. He denies any pain. He does endorse chills 3 days ago, but he thought he was getting a sinus infection. He reports sinus symptoms have resolved. Patient states that his sugars have been well controlled on his insulin and the highest he has seen has been 131 fasting. Pt is on chronic xarelto for a. Fib.   Past Medical History  Diagnosis Date  . DIABETES MELLITUS, TYPE II 08/11/2006  . HYPERTENSION 08/11/2006  . ALLERGIC RHINITIS 08/11/2006  . ASTHMA 08/11/2006  . DM W/RENAL MANIFESTATIONS, TYPE II 04/20/2007  . OBESITY, MORBID 12/14/2007  . Chronic atrial fibrillation (HCC) 08/2014   No Known Allergies    Review of Systems Negative, with the exception of above mentioned in HPI    Objective:   Physical Exam BP 153/71 mmHg  Pulse 83  Temp(Src) 98.4 F (36.9 C) (Oral)  Resp 14  Ht 5' 8.75" (1.746 m)  Wt 360 lb (163.295 kg)  BMI 53.57 kg/m2 Gen: Afebrile. No acute distress. Nontoxic in appearance. Morbidly obese male. HENT: AT. [place].  MMM. Eyes:Pupils Equal Round Reactive to light, Extraocular movements intact,  Conjunctiva without redness, discharge or icterus. CV: RRR EXT:  Right lower extremity 1+ pitting edema with bronzing/scarring/hyperpigmentation ankle/prevertebral  region. No erythema, skin intact. No ulcerations. Left lower extremity 2+ pitting edema, with erythema, warmth, induration posterior medial ankle to the tibia. No tenderness to palpation. No drainage. Skin intact. No ulcerations. Neuro: Normal gait. PERLA. EOMi. Alert. Oriented x3     Assessment & Plan:  1. Cellulitis of left lower extremity - Treat with regimen that has worked well for him in the past. - Rocpehin IM today, start keflex tomorrow.  - cephALEXin (KEFLEX) 500 MG capsule; Take 1 capsule (500 mg total) by mouth 3 (three) times daily.  Dispense: 30 capsule; Refill: 10 - cefTRIAXone (ROCEPHIN) injection 1 g; Inject 1 g into the muscle daily. - Red flags discussed with patient. He is to monitor his lites closely over the weekend. He was advised that he should see only improvement in his symptoms, any worsening of symptoms he needs to be seen immediately. Patient advised any fevers, chills, increasing swelling, increasing redness, increasing pain, nausea, vomit or unable to tolerate medications he is to be seen immediately. Patient encouraged to monitor his blood sugars closely, as the infection can worsening his blood glucose level. - He is to rest, keep his leg elevated and wear his compression stockings. - Patient in understanding of plan. - AVS provided for patient. F/U 5 days with PCP

## 2015-10-17 ENCOUNTER — Encounter: Payer: Self-pay | Admitting: Internal Medicine

## 2015-10-19 ENCOUNTER — Telehealth: Payer: Self-pay | Admitting: Internal Medicine

## 2015-10-19 NOTE — Telephone Encounter (Signed)
Close encounter 

## 2015-10-24 ENCOUNTER — Ambulatory Visit (HOSPITAL_BASED_OUTPATIENT_CLINIC_OR_DEPARTMENT_OTHER)
Admission: RE | Admit: 2015-10-24 | Discharge: 2015-10-24 | Disposition: A | Discharge status: Home / Self Care | Payer: 59 | Source: Ambulatory Visit | Attending: Family Medicine | Admitting: Family Medicine

## 2015-10-24 ENCOUNTER — Telehealth: Payer: Self-pay | Admitting: Family Medicine

## 2015-10-24 ENCOUNTER — Encounter: Payer: Self-pay | Admitting: Family Medicine

## 2015-10-24 ENCOUNTER — Ambulatory Visit (INDEPENDENT_AMBULATORY_CARE_PROVIDER_SITE_OTHER): Payer: 59 | Admitting: Family Medicine

## 2015-10-24 VITALS — BP 132/67 | HR 68 | Temp 97.9°F | Resp 24 | Wt 383.5 lb

## 2015-10-24 DIAGNOSIS — J209 Acute bronchitis, unspecified: Secondary | ICD-10-CM | POA: Insufficient documentation

## 2015-10-24 DIAGNOSIS — R0600 Dyspnea, unspecified: Secondary | ICD-10-CM

## 2015-10-24 DIAGNOSIS — R062 Wheezing: Secondary | ICD-10-CM | POA: Insufficient documentation

## 2015-10-24 DIAGNOSIS — R0989 Other specified symptoms and signs involving the circulatory and respiratory systems: Secondary | ICD-10-CM | POA: Diagnosis present

## 2015-10-24 DIAGNOSIS — R0609 Other forms of dyspnea: Principal | ICD-10-CM

## 2015-10-24 DIAGNOSIS — R0602 Shortness of breath: Secondary | ICD-10-CM | POA: Diagnosis not present

## 2015-10-24 LAB — CBC WITH DIFFERENTIAL/PLATELET
BASOS ABS: 0 10*3/uL (ref 0.0–0.1)
BASOS PCT: 0 % (ref 0–1)
EOS ABS: 0.4 10*3/uL (ref 0.0–0.7)
EOS PCT: 5 % (ref 0–5)
HCT: 36 % — ABNORMAL LOW (ref 39.0–52.0)
Hemoglobin: 11.5 g/dL — ABNORMAL LOW (ref 13.0–17.0)
Lymphocytes Relative: 19 % (ref 12–46)
Lymphs Abs: 1.4 10*3/uL (ref 0.7–4.0)
MCH: 27.1 pg (ref 26.0–34.0)
MCHC: 31.9 g/dL (ref 30.0–36.0)
MCV: 84.7 fL (ref 78.0–100.0)
MPV: 10.1 fL (ref 8.6–12.4)
Monocytes Absolute: 0.7 10*3/uL (ref 0.1–1.0)
Monocytes Relative: 10 % (ref 3–12)
NEUTROS PCT: 66 % (ref 43–77)
Neutro Abs: 4.9 10*3/uL (ref 1.7–7.7)
PLATELETS: 254 10*3/uL (ref 150–400)
RBC: 4.25 MIL/uL (ref 4.22–5.81)
RDW: 17.5 % — AB (ref 11.5–15.5)
WBC: 7.4 10*3/uL (ref 4.0–10.5)

## 2015-10-24 LAB — BASIC METABOLIC PANEL
BUN: 16 mg/dL (ref 7–25)
CALCIUM: 8.6 mg/dL (ref 8.6–10.3)
CHLORIDE: 100 mmol/L (ref 98–110)
CO2: 26 mmol/L (ref 20–31)
CREATININE: 1.13 mg/dL (ref 0.70–1.25)
Glucose, Bld: 142 mg/dL — ABNORMAL HIGH (ref 65–99)
Potassium: 4.7 mmol/L (ref 3.5–5.3)
SODIUM: 139 mmol/L (ref 135–146)

## 2015-10-24 MED ORDER — DOXYCYCLINE HYCLATE 100 MG PO TABS: 100.0000 mg | ORAL_TABLET | Freq: Two times a day (BID) | ORAL | Status: DC

## 2015-10-24 MED ORDER — IPRATROPIUM-ALBUTEROL 0.5-2.5 (3) MG/3ML IN SOLN
3.0000 mL | Freq: Once | RESPIRATORY_TRACT | Status: AC
  Administered 2015-10-24: 3 mL via RESPIRATORY_TRACT

## 2015-10-24 MED ORDER — ALBUTEROL SULFATE HFA 108 (90 BASE) MCG/ACT IN AERS: 2.0000 | INHALATION_SPRAY | Freq: Four times a day (QID) | RESPIRATORY_TRACT | Status: DC | PRN

## 2015-10-24 NOTE — Progress Notes (Signed)
Subjective:    Patient ID: [name], male    DOB: 02/27/1951, 64 y.o.   MRN: [id number]  HPI   Dyspnea: Pt presents for an acute OV for 1 week history of shortness of breath. He states he is not able to sleep, because he is unable to lay flat. He is unable to decipher if this is 2/2/ to cough/phlegm or shortness of breath. He has noticed increased fatigue and difficulty catching his breath with walking. He endorses fatigue, rhinorrhea, nasal congestion, chills, dyspnea on exertion, rest and speaking. He denies increase in LE edema, fever, nausea, vomit, diarrhea or rash. He was exposed to a sick contact at church. He is eating and drinking well. He denies h/o asthma, although present on chart. He is not prescribed inhalers. UTD with immunizatins. Pt last echo 09/2014 with diastolic dysfunction grade 2. EF 50-55%/ LVH pattern. His GFR is 70, with + microalb. Prior pulse ox 93-95%. Pt is up 23 lbs in 3 weeks, but doe snot feel that was a correct weight. He des not know what his dry weight is and is down 10 lbs from November weight. Pt reports compliance with his 40 mg of lasix daily and xarelto. He has a history of a.fib.  He states he has been taking coricidin and mucinex with only masking symptoms, but no improvement.   Past Medical History  Diagnosis Date  . DIABETES MELLITUS, TYPE II 08/11/2006  . HYPERTENSION 08/11/2006  . ALLERGIC RHINITIS 08/11/2006  . ASTHMA 08/11/2006  . DM W/RENAL MANIFESTATIONS, TYPE II 04/20/2007  . OBESITY, MORBID 12/14/2007  . Chronic atrial fibrillation (HCC) 08/2014   No Known Allergies  Past Surgical History  Procedure Laterality Date  . Ureteral stent placement    . Virtual colonoscopy  01/2011    Normal  . Transthoracic echocardiogram  11/2007; 09/2014    LV fxn normal, EF normal, mild dilation of left atrium.  2015 grade II diast dysfxn    Social History   Social History  . Marital Status: Single    Spouse Name: N/A  . Number of Children: N/A    . Years of Education: N/A   Occupational History  . Not on file.   Social History Main Topics  . Smoking status: Never Smoker   . Smokeless tobacco: Not on file  . Alcohol Use: No  . Drug Use: No  . Sexual Activity: Not on file   Other Topics Concern  . Not on file   Social History Narrative   Single, no children.   [place](orig from [place]   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   No Tobacco, no alc, drugs.   Exercise: no    Diet: no          Review of Systems Negative, with the exception of above mentioned in HPI     Objective:   Physical Exam BP 132/67 mmHg  Pulse 68  Temp(Src) 97.9 F (36.6 C)  Resp 24  Wt 383 lb 8 oz (173.954 kg)  SpO2 94% Gen: Afebrile. No acute distress. Non-toxic in appearance. Morbidly obese male.  HENT: AT. [place]. Bilateral TM visualized and normal in appearance. MMM, no oral lesons. Bilateral nares pale, no erythema, no swelling. Throat without erythema or exudates. Cough present on exam, no hoarseness present on exam. No TTP mas sinus.  Eyes:Pupils Equal Round Reactive to light, Extraocular movements intact,  Conjunctiva without redness, discharge or icterus. Neck/lymp/endocrine: Supple, No lymphadenopathy CV: RRR, +2  edema Chest: Bilateral exp wheeze, no crackles appreciated    Assessment & Plan:  1. Dyspnea/wheezing - Duoneb tx in office with great clinical benefit, although pt felt improved. - walk with pulse ox 95--> 91%, increased dyspnea and wheeze on exam. LE edema consistent with prior exam. Not certain weight from prior exam is correct.  - DG Chest 2 View; Future - CBC w/Diff - Basic Metabolic Panel (BMET) - B Nat Peptide - doxycycline (VIBRA-TABS) 100 MG tablet; Take 1 tablet (100 mg total) by mouth 2 (two) times daily.  Dispense: 20 tablet; Refill: 0 - albuterol (PROVENTIL HFA;VENTOLIN HFA) 108 (90 BASE) MCG/ACT inhaler; Inhale 2 puffs into the lungs every 6 (six) hours as needed for  wheezing or shortness of breath.  Dispense: 1 Inhaler; Refill: 0  2. Acute bronchitis, unspecified organism - will treat as bronchitis considering wheeze/ concern for PNA.  - CXR.doxy. Albuterol.   F/U 1 week.

## 2015-10-24 NOTE — Telephone Encounter (Signed)
Please call pt, I see that we treated his cellulitis with a different abx than I recalled. So I am going to treat his bronchitis today with doxycycline, instead of the z-pack I told him I was calling in.

## 2015-10-24 NOTE — Patient Instructions (Signed)
I am not certain if you have bronchitis vs more fluid.  I will treat you like bronchitis with a z pack and order an inhaler for you to use every 4-6 hours as need for shortness of breath.  This should only be used if not able to catch your breath well with rest,  I want you to get a chest xray to make certain this is not fluid vs infection. The blood work I collected today will also help to guide us in the right direction. If you become worse or can tolerate medication, will need to see you sooner, otherwise follow up in 1 week.

## 2015-10-24 NOTE — Telephone Encounter (Signed)
Called pt to discuss findings from cxr obtained today (see below). Briefly, it appears he is having worsening CHF with pulm interstitial edema.  - Discussed continue treatment for poss. Bronchitis.  - Increase lasix to 40 mg BID for 2 days (until seen) and then will need follow-up prior to weekend. - Discussed the importance of following up and worsening heart condition.  - Will order echo, last echo > [age] year old and new onset dyspnea.  - awaiting labs: CBC,BMP and BNP today. - will forward to front office to call and schedule appt for him prior to weekend with PCP.

## 2015-10-24 NOTE — Telephone Encounter (Signed)
Spoke with patient reviewed instructions. 

## 2015-10-25 ENCOUNTER — Telehealth: Payer: Self-pay | Admitting: Family Medicine

## 2015-10-25 LAB — BRAIN NATRIURETIC PEPTIDE: BRAIN NATRIURETIC PEPTIDE: 310.9 pg/mL — AB (ref 0.0–100.0)

## 2015-10-25 NOTE — Telephone Encounter (Signed)
LM for patient to CB °

## 2015-10-25 NOTE — Telephone Encounter (Signed)
Please call pt: - His labs have returned, and results suggest his difficulty in breathing is more fluid than infection. I have asked him to f/u prior to weekend to re-evaluate him and decided on plan/lasix dose etc. He can follow with either myself or Dr. Milinda CaveMcGowen. I have also ordered an echo to be completed within a week. - it does not appear he has been reached to schedule followup as of yet.

## 2015-10-25 NOTE — Telephone Encounter (Signed)
Spoke with patient he has scheduled appt to see Dr Milinda CaveMcgowen on Friday. He states he has also been scheduled for his Echocardiogram.

## 2015-10-27 ENCOUNTER — Encounter: Payer: Self-pay | Admitting: Family Medicine

## 2015-10-27 ENCOUNTER — Ambulatory Visit (INDEPENDENT_AMBULATORY_CARE_PROVIDER_SITE_OTHER): Payer: 59 | Admitting: Family Medicine

## 2015-10-27 VITALS — BP 146/80 | HR 70 | Temp 98.0°F | Resp 20 | Wt 396.8 lb

## 2015-10-27 DIAGNOSIS — J209 Acute bronchitis, unspecified: Secondary | ICD-10-CM

## 2015-10-27 DIAGNOSIS — I5031 Acute diastolic (congestive) heart failure: Secondary | ICD-10-CM | POA: Diagnosis not present

## 2015-10-27 LAB — BASIC METABOLIC PANEL
BUN: 15 mg/dL (ref 7–25)
CO2: 30 mmol/L (ref 20–31)
CREATININE: 1.24 mg/dL (ref 0.70–1.25)
Calcium: 8.9 mg/dL (ref 8.6–10.3)
Chloride: 99 mmol/L (ref 98–110)
GLUCOSE: 115 mg/dL — AB (ref 65–99)
POTASSIUM: 4.4 mmol/L (ref 3.5–5.3)
Sodium: 139 mmol/L (ref 135–146)

## 2015-10-27 MED ORDER — FUROSEMIDE 40 MG PO TABS: 80.0000 mg | ORAL_TABLET | Freq: Every day | ORAL | Status: DC

## 2015-10-27 NOTE — Progress Notes (Signed)
OFFICE VISIT  10/29/2015   CC:  Chief Complaint  Patient presents with  . Follow-up    shortness of breath   HPI:    Patient is a 64 y.o. Caucasian male who presents for 3d f/u recent mixed respiratory illness/acute bronchitis with also mild CHF noted on his CXR.  He is feeling MUCH IMPROVED now. His lasix was increased to 80 mg a day and he noted a brisk diuresis and he feels like his cough is much improved and ability to breath is much improved.  He was also started on doxycycline and a bronchodilator. LE swelling improved over the last few days.  He does not weigh himself at home.  Wts here have been of questionable accuracy.  No chest pain throughout this.  Never had fever but had chills initially.  Now this is resolved.  When he lies supine he finds his cough bothers him some and use of the albuterol helps this.   His cough throughout this illness was mostly NONproductive. No home bp monitoring.  His chronic LE edema has remained pretty significant, symmetric. He is wearing compression stockings today.  Past Medical History  Diagnosis Date  . DIABETES MELLITUS, TYPE II 08/11/2006  . HYPERTENSION 08/11/2006  . ALLERGIC RHINITIS 08/11/2006  . ASTHMA 08/11/2006  . DM W/RENAL MANIFESTATIONS, TYPE II 04/20/2007  . OBESITY, MORBID 12/14/2007  . Chronic atrial fibrillation (HCC) 08/2014    Past Surgical History  Procedure Laterality Date  . Ureteral stent placement    . Virtual colonoscopy  01/2011    Normal  . Transthoracic echocardiogram  11/2007; 09/2014    LV fxn normal, EF normal, mild dilation of left atrium.  2015 grade II diast dysfxn    Outpatient Prescriptions Prior to Visit  Medication Sig Dispense Refill  . albuterol (PROVENTIL HFA;VENTOLIN HFA) 108 (90 BASE) MCG/ACT inhaler Inhale 2 puffs into the lungs every 6 (six) hours as needed for wheezing or shortness of breath. 1 Inhaler 0  . b complex vitamins capsule Take 1 capsule by mouth every morning.    .  butalbital-acetaminophen-caffeine (FIORICET WITH CODEINE) 50-325-40-30 MG per capsule 1-2 caps po q6h prn headache 90 capsule 1  . cetirizine (ZYRTEC) 10 MG tablet Take 10 mg by mouth daily.    . Cinnamon 500 MG TABS Take by mouth.    . Coenzyme Q10 (CO Q 10) 10 MG CAPS Take by mouth.    . diltiazem (TIAZAC) 360 MG 24 hr capsule TAKE 1 CAPSULE BY MOUTH  DAILY AS DIRECTED  EQUIVALENT TO TIAZAC 90 capsule 3  . doxycycline (VIBRA-TABS) 100 MG tablet Take 1 tablet (100 mg total) by mouth 2 (two) times daily. 20 tablet 0  . HUMALOG MIX 75/25 KWIKPEN (75-25) 100 UNIT/ML Kwikpen Inject subcutaneously 25  units every morning and 20  units every evening at  supper 45 mL 1  . Insulin Pen Needle (BD ULTRA-FINE PEN NEEDLES) 29G X 12.7MM MISC 1 each by Other route 2 (two) times daily. 200 each 2  . Insulin Pen Needle (BD ULTRA-FINE PEN NEEDLES) 29G X 12.7MM MISC USE TWICE DAILY 200 each 3  . lisinopril (PRINIVIL,ZESTRIL) 40 MG tablet Take 1 tablet by mouth  every day 90 tablet 3  . metFORMIN (GLUCOPHAGE) 1000 MG tablet Take 1 tablet by mouth  twice a day with meals 180 tablet 1  . metoprolol (LOPRESSOR) 50 MG tablet Take 1 and 1/2 tablets by  mouth two times daily 270 tablet 3  . Multiple Vitamin (MULTIVITAMIN)  tablet Take 1 tablet by mouth daily.    . nitrofurantoin, macrocrystal-monohydrate, (MACROBID) 100 MG capsule Take 1 capsule by mouth  every week as needed 4 capsule 6  . rivaroxaban (XARELTO) 20 MG TABS tablet Take 1 tablet (20 mg total) by mouth daily with supper. 14 tablet 0  . saw palmetto 500 MG capsule Take 500 mg by mouth daily.    Marland Kitchen. terazosin (HYTRIN) 10 MG capsule TAKE 1 CAPSULE BY MOUTH  ONCE A DAY AT BEDTIME 90 capsule 3  . vitamin E 400 UNIT capsule Take 400 Units by mouth every morning. Selenium 50mg     . furosemide (LASIX) 40 MG tablet Take 1 tablet by mouth  daily (Patient taking differently: Take 2 tablets daily) 90 tablet 3   No facility-administered medications prior to visit.     No Known Allergies  ROS As per HPI  PE: Blood pressure 146/80, pulse 70, temperature 98 F (36.7 C), resp. rate 20, weight 396 lb 12 oz (179.965 kg), SpO2 92 %. Wt taken a second time at end of visit today was 394 lbs 8 oz. Gen: Alert, well appearing.  Patient is oriented to person, place, time, and situation. UJW:JXBJENT:Eyes: no injection, icteris, swelling, or exudate.  EOMI, PERRLA. Mouth: lips without lesion/swelling.  Oral mucosa pink and moist. Oropharynx without erythema, exudate, or swelling.  CV: Irreg irreg, rate 70s, no m/r/g.   LUNGS: CTA bilat, nonlabored resps, good aeration in all lung fields. EXT: 3+ pitting edema palpable through his compression stockings.  No clubbing or cyanosis  LABS:  None today  IMPRESSION AND PLAN:  Complicated respiratory illness: combo of acute URI/bronchitis PLUS mild CHF. Diuresis over the last 2 d has been brisk and I think this has made the biggest difference in his improvement. Continue lasix at 80 mg po qd.  Finish doxy, continue albut HFA q4h prn. Reiterated importance of low Na diet, wear compression stockings, elevate legs at least 15 min qd. He has an echo scheduled for next week.   Check BMET today.  An After Visit Summary was printed and given to the patient.  FOLLOW UP: Return in about 2 weeks (around 11/10/2015) for f/u CHF/edema.

## 2015-11-04 ENCOUNTER — Other Ambulatory Visit: Payer: Self-pay | Admitting: Family Medicine

## 2015-11-05 DIAGNOSIS — N182 Chronic kidney disease, stage 2 (mild): Secondary | ICD-10-CM

## 2015-11-05 HISTORY — DX: Chronic kidney disease, stage 2 (mild): N18.2

## 2015-11-07 ENCOUNTER — Other Ambulatory Visit: Payer: Self-pay

## 2015-11-07 ENCOUNTER — Ambulatory Visit (HOSPITAL_COMMUNITY): Payer: 59 | Attending: Cardiovascular Disease

## 2015-11-07 DIAGNOSIS — E119 Type 2 diabetes mellitus without complications: Secondary | ICD-10-CM | POA: Insufficient documentation

## 2015-11-07 DIAGNOSIS — I10 Essential (primary) hypertension: Secondary | ICD-10-CM | POA: Insufficient documentation

## 2015-11-07 DIAGNOSIS — I517 Cardiomegaly: Secondary | ICD-10-CM | POA: Insufficient documentation

## 2015-11-07 DIAGNOSIS — I059 Rheumatic mitral valve disease, unspecified: Secondary | ICD-10-CM | POA: Diagnosis not present

## 2015-11-07 DIAGNOSIS — E669 Obesity, unspecified: Secondary | ICD-10-CM | POA: Diagnosis not present

## 2015-11-07 DIAGNOSIS — R0600 Dyspnea, unspecified: Secondary | ICD-10-CM | POA: Diagnosis present

## 2015-11-07 DIAGNOSIS — Z6841 Body Mass Index (BMI) 40.0 and over, adult: Secondary | ICD-10-CM | POA: Diagnosis not present

## 2015-11-07 DIAGNOSIS — R0602 Shortness of breath: Secondary | ICD-10-CM | POA: Diagnosis not present

## 2015-11-07 DIAGNOSIS — R0609 Other forms of dyspnea: Secondary | ICD-10-CM | POA: Diagnosis not present

## 2015-11-07 NOTE — Telephone Encounter (Signed)
RF request for xarelto LOV: 10/27/15 Next ov: 12/11/15 Last written: 05/16/15 #14 w/ 0RF

## 2015-11-08 ENCOUNTER — Telehealth: Payer: Self-pay | Admitting: Family Medicine

## 2015-11-08 NOTE — Telephone Encounter (Signed)
Please call patient: - Patient's echocardiogram results are consistent with prior studies.  - Low salt diet, exercise as tolerated and blood pressure control as advised, and will help prevent him from retaining fluid/dyspnea. - Patient to follow-up with PCP as requested, it appears she is asked to return in 2 weeks with his PCP for follow-up on edema/CHF.

## 2015-11-09 NOTE — Telephone Encounter (Signed)
Left message on patient voice mail . Reminded patient to keep his appt with Dr. Milinda CaveMcgowen 11/13/15

## 2015-11-13 ENCOUNTER — Ambulatory Visit: Payer: 59 | Admitting: Family Medicine

## 2015-11-20 ENCOUNTER — Ambulatory Visit (INDEPENDENT_AMBULATORY_CARE_PROVIDER_SITE_OTHER): Payer: 59 | Admitting: Family Medicine

## 2015-11-20 ENCOUNTER — Encounter: Payer: Self-pay | Admitting: Family Medicine

## 2015-11-20 VITALS — BP 143/88 | HR 82 | Temp 97.9°F | Resp 16 | Ht 68.75 in | Wt 393.2 lb

## 2015-11-20 DIAGNOSIS — I5032 Chronic diastolic (congestive) heart failure: Secondary | ICD-10-CM | POA: Diagnosis not present

## 2015-11-20 DIAGNOSIS — I872 Venous insufficiency (chronic) (peripheral): Secondary | ICD-10-CM | POA: Diagnosis not present

## 2015-11-20 DIAGNOSIS — R062 Wheezing: Secondary | ICD-10-CM

## 2015-11-20 LAB — BASIC METABOLIC PANEL
BUN: 13 mg/dL (ref 6–23)
CALCIUM: 8.2 mg/dL — AB (ref 8.4–10.5)
CHLORIDE: 101 meq/L (ref 96–112)
CO2: 29 mEq/L (ref 19–32)
CREATININE: 1 mg/dL (ref 0.40–1.50)
GFR: 79.8 mL/min (ref >60.00)
GLUCOSE: 136 mg/dL — AB (ref 70–99)
Potassium: 4 mEq/L (ref 3.5–5.1)
Sodium: 141 mEq/L (ref 135–145)

## 2015-11-20 LAB — MAGNESIUM: Magnesium: 1.6 mg/dL (ref 1.5–2.5)

## 2015-11-20 MED ORDER — ALBUTEROL SULFATE HFA 108 (90 BASE) MCG/ACT IN AERS: 2.0000 | INHALATION_SPRAY | Freq: Four times a day (QID) | RESPIRATORY_TRACT | Status: DC | PRN

## 2015-11-20 NOTE — Progress Notes (Signed)
OFFICE VISIT  11/20/2015   CC:  Chief Complaint  Patient presents with  . Follow-up    Pt is fasting.    HPI:    Patient is a 65 y.o. Caucasian male with chronic atrial fibrillation and diastolic CHF who presents for 2 week f/u CHF exac.  Last visit I kept him on 80mg  of lasix per day.  EF 11/07/15 showed preserved LF function: reviewed this result with pt today.  Says he is feeling very well. Still continues to improve regarding cough/SOB.  He still has some coughing and albuterol helps with this.  No fevers. No change in LE edema per pt.  Says the 80mg  qd lasix dosing "just makes me pee more and drink more water".  SOB with walking is unchanged--says it feels stable.  He says he can't tell that the last 2 weeks of being on 80 mg qd has made him feel any different compared to 40mg  qd. No fevers.   Past Medical History  Diagnosis Date  . DIABETES MELLITUS, TYPE II 08/11/2006  . HYPERTENSION 08/11/2006  . ALLERGIC RHINITIS 08/11/2006  . ASTHMA 08/11/2006  . DM W/RENAL MANIFESTATIONS, TYPE II 04/20/2007  . OBESITY, MORBID 12/14/2007  . Chronic atrial fibrillation (HCC) 08/2014    Past Surgical History  Procedure Laterality Date  . Ureteral stent placement    . Virtual colonoscopy  01/2011    Normal  . Transthoracic echocardiogram  11/2007; 09/2014; 11/2015    LV fxn normal, EF normal, mild dilation of left atrium.  2015 grade II diast dysfxn.  2017 EF 55-60%.    Outpatient Prescriptions Prior to Visit  Medication Sig Dispense Refill  . b complex vitamins capsule Take 1 capsule by mouth every morning.    . butalbital-acetaminophen-caffeine (FIORICET WITH CODEINE) 50-325-40-30 MG per capsule 1-2 caps po q6h prn headache 90 capsule 1  . cetirizine (ZYRTEC) 10 MG tablet Take 10 mg by mouth daily.    . Cinnamon 500 MG TABS Take by mouth.    . Coenzyme Q10 (CO Q 10) 10 MG CAPS Take by mouth.    . diltiazem (TIAZAC) 360 MG 24 hr capsule TAKE 1 CAPSULE BY MOUTH  DAILY AS DIRECTED   EQUIVALENT TO TIAZAC 90 capsule 3  . furosemide (LASIX) 40 MG tablet Take 2 tablets (80 mg total) by mouth daily. 90 tablet 3  . HUMALOG MIX 75/25 KWIKPEN (75-25) 100 UNIT/ML Kwikpen Inject subcutaneously 25  units every morning and 20  units every evening at  supper 45 mL 1  . Insulin Pen Needle (BD ULTRA-FINE PEN NEEDLES) 29G X 12.7MM MISC 1 each by Other route 2 (two) times daily. 200 each 2  . Insulin Pen Needle (BD ULTRA-FINE PEN NEEDLES) 29G X 12.7MM MISC USE TWICE DAILY 200 each 3  . lisinopril (PRINIVIL,ZESTRIL) 40 MG tablet Take 1 tablet by mouth  every day 90 tablet 3  . metFORMIN (GLUCOPHAGE) 1000 MG tablet Take 1 tablet by mouth  twice a day with meals 180 tablet 1  . metoprolol (LOPRESSOR) 50 MG tablet Take 1 and 1/2 tablets by  mouth two times daily 270 tablet 3  . Multiple Vitamin (MULTIVITAMIN) tablet Take 1 tablet by mouth daily.    . nitrofurantoin, macrocrystal-monohydrate, (MACROBID) 100 MG capsule Take 1 capsule by mouth  every week as needed 4 capsule 6  . saw palmetto 500 MG capsule Take 500 mg by mouth daily.    Marland Kitchen terazosin (HYTRIN) 10 MG capsule TAKE 1 CAPSULE BY MOUTH  ONCE A DAY AT BEDTIME 90 capsule 3  . vitamin E 400 UNIT capsule Take 400 Units by mouth every morning. Selenium 50mg     . XARELTO 20 MG TABS tablet Take 1 tablet by mouth  daily with supper 90 tablet 1  . albuterol (PROVENTIL HFA;VENTOLIN HFA) 108 (90 BASE) MCG/ACT inhaler Inhale 2 puffs into the lungs every 6 (six) hours as needed for wheezing or shortness of breath. 1 Inhaler 0  . doxycycline (VIBRA-TABS) 100 MG tablet Take 1 tablet (100 mg total) by mouth 2 (two) times daily. (Patient not taking: Reported on 11/20/2015) 20 tablet 0   No facility-administered medications prior to visit.    No Known Allergies  ROS As per HPI  PE: Blood pressure 143/88, pulse 82, temperature 97.9 F (36.6 C), temperature source Oral, resp. rate 16, height 5' 8.75" (1.746 m), weight 393 lb 4 oz (178.377 kg), SpO2 91  %. Gen: Alert, well appearing, obese WM in NAD.  Patient is oriented to person, place, time, and situation. CV: Irreg irreg, no m/r. LUNGS: CTA bilat, nonlabored resps. EXT: 3+ pitting edema in both LL's, some flaky and pinkish discoloration in pretibial aspects of both LL's, but no erythema, skin breakdown, or tenderness.   LABS:    Chemistry      Component Value Date/Time   NA 139 10/27/2015 1627   K 4.4 10/27/2015 1627   CL 99 10/27/2015 1627   CO2 30 10/27/2015 1627   BUN 15 10/27/2015 1627   CREATININE 1.24 10/27/2015 1627   CREATININE 1.12 06/05/2015 1003      Component Value Date/Time   CALCIUM 8.9 10/27/2015 1627   ALKPHOS 54 06/05/2015 1003   AST 12 06/05/2015 1003   ALT 12 06/05/2015 1003   BILITOT 0.9 06/05/2015 1003      IMPRESSION AND PLAN:  1) Diastolic CHF: most recent echo 11/2015 w/out comment about diastolic dysfunction but I feel he clinically had CHF in his illness just prior to getting that echo. He had some acute bronchitis as well--all of this is resolving appropriately. Since he hasn't felt any clinical improvement since the first few days of being on increased dose of lasix, I will decrease his dose back to 40mg  qd.  Check lytes/cr today. Given body habitus, I also suspect he has a component of obesity hypoventilation syndrome (last CO2 was 30) contributing to his DOE. At any rate, he seems very stable currently.  2) Chronic venous insufficiency edema: no change/stable. Resume lasix 40mg  qd.  An After Visit Summary was printed and given to the patient.  FOLLOW UP: keep f/u already scheduled for 12/18/15 for annual CPE +DRE/PSA

## 2015-11-20 NOTE — Progress Notes (Signed)
Pre visit review using our clinic review tool, if applicable. No additional management support is needed unless otherwise documented below in the visit note. 

## 2015-12-04 ENCOUNTER — Ambulatory Visit: Payer: 59 | Admitting: Internal Medicine

## 2015-12-04 LAB — HM DIABETES EYE EXAM

## 2015-12-06 ENCOUNTER — Ambulatory Visit: Payer: 59 | Admitting: Internal Medicine

## 2015-12-11 ENCOUNTER — Encounter: Payer: Self-pay | Admitting: Family Medicine

## 2015-12-11 ENCOUNTER — Ambulatory Visit (INDEPENDENT_AMBULATORY_CARE_PROVIDER_SITE_OTHER): Payer: 59 | Admitting: Family Medicine

## 2015-12-11 VITALS — BP 154/78 | HR 82 | Temp 97.8°F | Resp 16 | Ht 68.75 in | Wt >= 6400 oz

## 2015-12-11 DIAGNOSIS — E1140 Type 2 diabetes mellitus with diabetic neuropathy, unspecified: Secondary | ICD-10-CM | POA: Diagnosis not present

## 2015-12-11 DIAGNOSIS — I878 Other specified disorders of veins: Secondary | ICD-10-CM | POA: Diagnosis not present

## 2015-12-11 DIAGNOSIS — I10 Essential (primary) hypertension: Secondary | ICD-10-CM | POA: Diagnosis not present

## 2015-12-11 DIAGNOSIS — Z0000 Encounter for general adult medical examination without abnormal findings: Secondary | ICD-10-CM | POA: Diagnosis not present

## 2015-12-11 DIAGNOSIS — K5900 Constipation, unspecified: Secondary | ICD-10-CM

## 2015-12-11 DIAGNOSIS — Z125 Encounter for screening for malignant neoplasm of prostate: Secondary | ICD-10-CM

## 2015-12-11 DIAGNOSIS — Z794 Long term (current) use of insulin: Secondary | ICD-10-CM

## 2015-12-11 LAB — LIPID PANEL
CHOL/HDL RATIO: 3
Cholesterol: 85 mg/dL (ref 0–200)
HDL: 33.7 mg/dL — AB (ref >39.00)
LDL CALC: 41 mg/dL (ref 0–99)
NONHDL: 50.86
Triglycerides: 48 mg/dL (ref 0.0–149.0)
VLDL: 9.6 mg/dL (ref 0.0–40.0)

## 2015-12-11 LAB — BASIC METABOLIC PANEL
BUN: 15 mg/dL (ref 6–23)
CALCIUM: 8.8 mg/dL (ref 8.4–10.5)
CO2: 29 mEq/L (ref 19–32)
CREATININE: 1.08 mg/dL (ref 0.40–1.50)
Chloride: 101 mEq/L (ref 96–112)
GFR: 73.01 mL/min (ref >60.00)
Glucose, Bld: 141 mg/dL — ABNORMAL HIGH (ref 70–99)
Potassium: 4.4 mEq/L (ref 3.5–5.1)
SODIUM: 139 meq/L (ref 135–145)

## 2015-12-11 LAB — HEMOGLOBIN A1C: Hgb A1c MFr Bld: 6.1 % (ref 4.6–6.5)

## 2015-12-11 LAB — PSA: PSA: 0.21 ng/mL (ref 0.10–4.00)

## 2015-12-11 NOTE — Addendum Note (Signed)
Addended by: [name] on: 12/11/2015 12:07 PM   Modules accepted: Orders

## 2015-12-11 NOTE — Patient Instructions (Addendum)
Buy otc generic senakot S and take 2 tabs every night. Buy otc generic miralax and take 1 capful once daily.  Take 80 mg of lasix (furosemide) daily for the next 3 days.

## 2015-12-11 NOTE — Progress Notes (Signed)
Office Note 12/11/2015  CC:  Chief Complaint  Patient presents with  . Annual Exam    Pt is fasting.    HPI:  [name] is a 65 y.o. White male who is here for annual health maintenance exam. Says he is constipated last few weeks, trying to eat lots of high fiber foods, trying to eat lean meats.  Has occ small amounts of stool come out but no good evacuation.  Feels full/bloated/gassy.  Hasn't tried any otc meds for this.  Eating low Na diet.   Glucoses: 120-140 fasting qd, 120 at the evening meal. Insulin injection 20-25 U qAM, most of the time not giving an insulin injection later in the day.  Has not taken any meds yet today.   Past Medical History  Diagnosis Date  . DIABETES MELLITUS, TYPE II 08/11/2006  . HYPERTENSION 08/11/2006  . ALLERGIC RHINITIS 08/11/2006  . ASTHMA 08/11/2006  . DM W/RENAL MANIFESTATIONS, TYPE II 04/20/2007  . OBESITY, MORBID 12/14/2007  . Chronic atrial fibrillation (HCC) 08/2014  . Venous insufficiency of both lower extremities     Past Surgical History  Procedure Laterality Date  . Ureteral stent placement    . Virtual colonoscopy  01/2011    Normal  . Transthoracic echocardiogram  11/2007; 09/2014; 11/2015    LV fxn normal, EF normal, mild dilation of left atrium.  2015 grade II diast dysfxn.  2017 EF 55-60%.    Family History  Problem Relation Age of Onset  . Diabetes Mother   . Diabetes Sister   . Hydrocephalus Sister     NPH    Social History   Social History  . Marital Status: Single    Spouse Name: N/A  . Number of Children: N/A  . Years of Education: N/A   Occupational History  . Not on file.   Social History Main Topics  . Smoking status: Never Smoker   . Smokeless tobacco: Not on file  . Alcohol Use: No  . Drug Use: No  . Sexual Activity: Not on file   Other Topics Concern  . Not on file   Social History Narrative   Single, no children.   Lives in [place] since 1985 [place] from [place]).   Occupation: Science writer for [employer].   No Tobacco, no alc, drugs.   Exercise: no    Diet: no          Outpatient Prescriptions Prior to Visit  Medication Sig Dispense Refill  . albuterol (PROVENTIL HFA;VENTOLIN HFA) 108 (90 Base) MCG/ACT inhaler Inhale 2 puffs into the lungs every 6 (six) hours as needed for wheezing or shortness of breath. 1 Inhaler 1  . b complex vitamins capsule Take 1 capsule by mouth every morning.    . butalbital-acetaminophen-caffeine (FIORICET WITH CODEINE) 50-325-40-30 MG per capsule 1-2 caps po q6h prn headache 90 capsule 1  . cetirizine (ZYRTEC) 10 MG tablet Take 10 mg by mouth daily.    . Cinnamon 500 MG TABS Take by mouth.    . Coenzyme Q10 (CO Q 10) 10 MG CAPS Take by mouth.    . diltiazem (TIAZAC) 360 MG 24 hr capsule TAKE 1 CAPSULE BY MOUTH  DAILY AS DIRECTED  EQUIVALENT TO TIAZAC 90 capsule 3  . furosemide (LASIX) 40 MG tablet Take 2 tablets (80 mg total) by mouth daily. 90 tablet 3  . HUMALOG MIX 75/25 KWIKPEN (75-25) 100 UNIT/ML Kwikpen Inject subcutaneously 25  units every morning and 20  units every evening at  supper 45 mL 1  . Insulin Pen Needle (BD ULTRA-FINE PEN NEEDLES) 29G X 12.7MM MISC 1 each by Other route 2 (two) times daily. 200 each 2  . Insulin Pen Needle (BD ULTRA-FINE PEN NEEDLES) 29G X 12.7MM MISC USE TWICE DAILY 200 each 3  . lisinopril (PRINIVIL,ZESTRIL) 40 MG tablet Take 1 tablet by mouth  every day 90 tablet 3  . metFORMIN (GLUCOPHAGE) 1000 MG tablet Take 1 tablet by mouth  twice a day with meals 180 tablet 1  . metoprolol (LOPRESSOR) 50 MG tablet Take 1 and 1/2 tablets by  mouth two times daily 270 tablet 3  . Multiple Vitamin (MULTIVITAMIN) tablet Take 1 tablet by mouth daily.    . nitrofurantoin, macrocrystal-monohydrate, (MACROBID) 100 MG capsule Take 1 capsule by mouth  every week as needed 4 capsule 6  . saw palmetto 500 MG capsule Take 500 mg by mouth daily.    Marland Kitchen terazosin (HYTRIN) 10 MG capsule TAKE 1 CAPSULE BY MOUTH   ONCE A DAY AT BEDTIME 90 capsule 3  . vitamin E 400 UNIT capsule Take 400 Units by mouth every morning. Selenium 50mg     . XARELTO 20 MG TABS tablet Take 1 tablet by mouth  daily with supper 90 tablet 1   No facility-administered medications prior to visit.    No Known Allergies  ROS Review of Systems  Constitutional: Negative for fever, chills, appetite change and fatigue.  HENT: Negative for congestion, dental problem, ear pain and sore throat.   Eyes: Negative for discharge, redness and visual disturbance.  Respiratory: Negative for cough, chest tightness, shortness of breath and wheezing.   Cardiovascular: Positive for leg swelling (chronic). Negative for chest pain and palpitations.  Gastrointestinal: Positive for constipation. Negative for nausea, vomiting, abdominal pain, diarrhea and blood in stool.  Genitourinary: Negative for dysuria, urgency, frequency, hematuria, flank pain and difficulty urinating.  Musculoskeletal: Negative for myalgias, back pain, joint swelling, arthralgias and neck stiffness.  Skin: Negative for pallor and rash.  Neurological: Negative for dizziness, speech difficulty, weakness and headaches.  Hematological: Negative for adenopathy. Does not bruise/bleed easily.  Psychiatric/Behavioral: Negative for confusion and sleep disturbance. The patient is not nervous/anxious.     PE; Blood pressure 154/78, pulse 82, temperature 97.8 F (36.6 C), temperature source Oral, resp. rate 16, height 5' 8.75" (1.746 m), weight 403 lb (182.8 kg), SpO2 93 %. Gen: Alert, well appearing, morbidly obese WM in NAD.  Patient is oriented to person, place, time, and situation. AFFECT: pleasant, lucid thought and speech. ENT: Ears: EACs clear, normal epithelium.  TMs with good light reflex and landmarks bilaterally.  Eyes: no injection, icteris, swelling, or exudate.  EOMI, PERRLA. Nose: no drainage or turbinate edema/swelling.  No injection or focal lesion.  Mouth: lips without  lesion/swelling.  Oral mucosa pink and moist.  No gingival swelling.  Dentures in place.  Oropharynx without erythema, exudate, or swelling.  Neck: supple/nontender.  No LAD, mass, or TM.  Carotid pulses 2+ bilaterally, without bruits. CV: Irreg irreg, rate around 80s, no m/r/g.   LUNGS: CTA bilat, nonlabored resps, good aeration in all lung fields. ABD: soft, NT, mild distention, BS normal.  No hepatospenomegaly or mass.  No bruits. EXT: no clubbing or cyanosis.  3+ pitting edema bilat, small amount of weeping of clear fluid from skin of R LL.  Fibrotic and scaly changes to skin of pretibial regions bilat.  No erythema. Musculoskeletal: no joint swelling, erythema, warmth, or tenderness.  ROM of all joints  intact. Skin - no sores or suspicious lesions or rashes or color changes Rectal: pt deferred until not so constipated and gassy.  Pertinent labs:  Lab Results  Component Value Date   TSH 3.22 06/05/2015   Lab Results  Component Value Date   WBC 7.4 10/24/2015   HGB 11.5* 10/24/2015   HCT 36.0* 10/24/2015   MCV 84.7 10/24/2015   PLT 254 10/24/2015   Lab Results  Component Value Date   CREATININE 1.00 11/20/2015   BUN 13 11/20/2015   NA 141 11/20/2015   K 4.0 11/20/2015   CL 101 11/20/2015   CO2 29 11/20/2015   Lab Results  Component Value Date   ALT 12 06/05/2015   AST 12 06/05/2015   ALKPHOS 54 06/05/2015   BILITOT 0.9 06/05/2015   Lab Results  Component Value Date   CHOL 105 06/05/2015   Lab Results  Component Value Date   HDL 37.90* 06/05/2015   Lab Results  Component Value Date   LDLCALC 56 06/05/2015   Lab Results  Component Value Date   TRIG 58.0 06/05/2015   Lab Results  Component Value Date   CHOLHDL 3 06/05/2015   Lab Results  Component Value Date   PSA 0.37 08/15/2014   Lab Results  Component Value Date   HGBA1C 6.3 09/11/2015   ASSESSMENT AND PLAN:   1) Constipation: continue high fiber diet. Instructions: Buy otc generic senakot S  and take 2 tabs every night. Buy otc generic miralax and take 1 capful once daily.  2) Health maintenance exam: Reviewed age and gender appropriate health maintenance issues (prudent diet, regular exercise, health risks of tobacco and excessive alcohol, use of seatbelts, fire alarms in home, use of sunscreen).  Also reviewed age and gender appropriate health screening as well as vaccine recommendations. DRE deferred until next f/u exam, PSA drawn today. LABS due today: HbA1c, BMET, FLP. Pt did want screening for HIV but declined hep C screening. Colon ca screening UTD.  3) Chronic LE venous insufficiency edema: worse lately. Increase lasix to  qd x 3d, continue low Na diet, try to be better with elevating legs above level of heart for 20 min per day.  An After Visit Summary was printed and given to the patient.  FOLLOW UP:  Return in about 4 months (around 04/09/2016) for routine chronic illness f/u.

## 2015-12-11 NOTE — Progress Notes (Signed)
Pre visit review using our clinic review tool, if applicable. No additional management support is needed unless otherwise documented below in the visit note. 

## 2015-12-12 LAB — HIV ANTIBODY (ROUTINE TESTING W REFLEX): HIV: NONREACTIVE

## 2015-12-18 ENCOUNTER — Ambulatory Visit (INDEPENDENT_AMBULATORY_CARE_PROVIDER_SITE_OTHER): Payer: 59 | Admitting: Internal Medicine

## 2015-12-18 ENCOUNTER — Encounter: Payer: Self-pay | Admitting: Internal Medicine

## 2015-12-18 VITALS — BP 128/78 | HR 78 | Ht 69.0 in | Wt 399.5 lb

## 2015-12-18 DIAGNOSIS — E0840 Diabetes mellitus due to underlying condition with diabetic neuropathy, unspecified: Secondary | ICD-10-CM | POA: Diagnosis not present

## 2015-12-18 DIAGNOSIS — Z794 Long term (current) use of insulin: Secondary | ICD-10-CM

## 2015-12-18 DIAGNOSIS — I4891 Unspecified atrial fibrillation: Secondary | ICD-10-CM

## 2015-12-18 DIAGNOSIS — I878 Other specified disorders of veins: Secondary | ICD-10-CM

## 2015-12-18 NOTE — Patient Instructions (Addendum)
Your physician wants you to follow-up in:  1 year with Dr. Hilty.  You will receive a reminder letter in the mail two months in advance. If you don't receive a letter, please call our office to schedule the follow-up appointment.  Your physician recommends that you continue on your current medications as directed. Please refer to the Current Medication list given to you today.  

## 2015-12-18 NOTE — Progress Notes (Signed)
OFFICE NOTE  Chief Complaint:  Dyspnea on exertion  Primary Care Physician: MCGOWEN,PJeoffrey MassedHPI:  [name] is a 65 year old male who is currently referred by Dr. Milinda Cave for new onset atrial flutter/fibrillation. [name] was at his office for a routine physical and underwent an EKG. This demonstrated either coarse A. Fib or atrial flutter with variable ventricular response and a rate around 80. It was noted that he was already on metoprolol tartrate 75 mg twice daily for hypertension. 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  He is reportedly unaware of his atrial fibrillation. Fortunately an EKG in the office today shows that he is back in a sinus rhythm or sinus arrhythmia. There is no family history of coronary disease or A. Fib. He denies any chest pain or worsening shortness of breath, but has been having problems with sciatic pain in his back and difficulty ambulating. He reports he gets good sleep at night, rarely wakes up, does not have morning headaches or the need for daytime napping. His screening EPWSS was only 3, suggesting that obstructive sleep apnea is actually fairly unlikely, despite his significant weight.  [name] returns today for follow-up. He underwent a nuclear stress test which was a 2 day study. This demonstrated significant TID 1.48, however it is unclear how reliable this could be because of the fact the study was done on 2 different days. The test was negative for ischemia. EF was calculated at 42%, which is lower than his EF was thought to be at 50%. He denies any chest pain, ever. He also has stable shortness of breath but no worsening symptoms. He reportedly was unaware of A. fib and still has not had any symptoms. He is currently on Xarelto and is not having any bleeding problems.  [name] returns today for follow-up. He reports  some recent worsening of shortness of breath on exertion. He saw Dr. Milinda Cave for this and had some increase in his Lasix. Up to 80 mg daily. He is currently taking 40 mg daily does not notice significant difference in his symptoms. He had a recent echocardiogram which shows an improvement in LV function up to 55-60%. He feels some of this is due to excess weight. He's been less active. He is now close to 400 pounds. Has pain. He has recurrent atrial fibrillation and is in A. fib today to rate is 78. This is been paroxysmal in the past however may be more persistent. He is not aware of his A. fib. He is on Xarelto and is been compliant with that medication. He denies any bleeding problems.   PMHx:  Past Medical History  Diagnosis Date  . DIABETES MELLITUS, TYPE II 08/11/2006  . HYPERTENSION 08/11/2006  . ALLERGIC RHINITIS 08/11/2006  . ASTHMA 08/11/2006  . DM W/RENAL MANIFESTATIONS, TYPE II 04/20/2007  . OBESITY, MORBID 12/14/2007  . Chronic atrial fibrillation (HCC) 08/2014  . Venous insufficiency of both lower extremities   . Chronic constipation     Past Surgical History  Procedure Laterality Date  . Ureteral stent placement    . Virtual colonoscopy  01/2011    Normal  . Transthoracic echocardiogram  11/2007; 09/2014; 11/2015    LV fxn normal, EF normal, mild dilation of left atrium.  2015 grade II diast dysfxn.  2017 EF 55-60%.    FAMHx:  Family History  Problem Relation Age of  Onset  . Diabetes Mother   . Diabetes Sister   . Hydrocephalus Sister     NPH    SOCHx:   reports that he has never smoked. He does not have any smokeless tobacco history on file. He reports that he does not drink alcohol or use illicit drugs.  ALLERGIES:  No Known Allergies  ROS: A comprehensive review of systems was negative except for: Respiratory: positive for dyspnea on exertion  HOME MEDS: Current Outpatient Prescriptions  Medication Sig Dispense Refill  . albuterol (PROVENTIL HFA;VENTOLIN HFA) 108  (90 Base) MCG/ACT inhaler Inhale 2 puffs into the lungs every 6 (six) hours as needed for wheezing or shortness of breath. 1 Inhaler 1  . b complex vitamins capsule Take 1 capsule by mouth every morning.    . butalbital-acetaminophen-caffeine (FIORICET WITH CODEINE) 50-325-40-30 MG per capsule 1-2 caps po q6h prn headache 90 capsule 1  . cetirizine (ZYRTEC) 10 MG tablet Take 10 mg by mouth daily.    . Cinnamon 500 MG TABS Take by mouth.    . Coenzyme Q10 (CO Q 10) 10 MG CAPS Take by mouth.    . diltiazem (TIAZAC) 360 MG 24 hr capsule TAKE 1 CAPSULE BY MOUTH  DAILY AS DIRECTED  EQUIVALENT TO TIAZAC 90 capsule 3  . furosemide (LASIX) 40 MG tablet Take 2 tablets (80 mg total) by mouth daily. 90 tablet 3  . HUMALOG MIX 75/25 KWIKPEN (75-25) 100 UNIT/ML Kwikpen Inject subcutaneously 25  units every morning and 20  units every evening at  supper 45 mL 1  . Insulin Pen Needle (BD ULTRA-FINE PEN NEEDLES) 29G X 12.7MM MISC 1 each by Other route 2 (two) times daily. 200 each 2  . Insulin Pen Needle (BD ULTRA-FINE PEN NEEDLES) 29G X 12.7MM MISC USE TWICE DAILY 200 each 3  . lisinopril (PRINIVIL,ZESTRIL) 40 MG tablet Take 1 tablet by mouth  every day 90 tablet 3  . metFORMIN (GLUCOPHAGE) 1000 MG tablet Take 1 tablet by mouth  twice a day with meals 180 tablet 1  . metoprolol (LOPRESSOR) 50 MG tablet Take 1 and 1/2 tablets by  mouth two times daily 270 tablet 3  . Multiple Vitamin (MULTIVITAMIN) tablet Take 1 tablet by mouth daily.    . nitrofurantoin, macrocrystal-monohydrate, (MACROBID) 100 MG capsule Take 1 capsule by mouth  every week as needed 4 capsule 6  . saw palmetto 500 MG capsule Take 500 mg by mouth daily.    Marland Kitchen terazosin (HYTRIN) 10 MG capsule TAKE 1 CAPSULE BY MOUTH  ONCE A DAY AT BEDTIME 90 capsule 3  . vitamin E 400 UNIT capsule Take 400 Units by mouth every morning. Selenium     . XARELTO 20 MG TABS tablet Take 1 tablet by mouth  daily with supper 90 tablet 1   No current  facility-administered medications for this visit.    LABS/IMAGING: No results found for this or any previous visit (from the past 48 hour(s)). No results found.  VITALS: BP 128/78 mmHg  Pulse 78  Ht  (1.753 m)  Wt 399 lb 8 oz (181.212 kg)  BMI 58.97 kg/m2  EXAM: General appearance: alert, no distress and morbidly obese Neck: no carotid bruit and no JVD Lungs: clear to auscultation bilaterally Heart: irregularly irregular rhythm Abdomen: soft, non-tender; bowel sounds normal; no masses,  no organomegaly Extremities: edema 1+ bilateral Pulses: 2+ and symmetric Skin: Skin color, texture, turgor normal. No rashes or lesions Neurologic: Grossly normal Psych: Pleasant  EKG:  A. fib with controlled ventricular response at 78, nonspecific T wave changes  ASSESSMENT: 1.  PAF-CHADSVASC score of  2. Hypertension 3. Morbid obesity 4. 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 Dyslipidemia 6. Combined chronic systolic and diastolic heart failure (LVEF 55-60% in 11/2015)  PLAN: 1.   Mr. Matty  has had some recent shortness of breath which is attributed to an upper respiratory infection. He was diuresed additionally and an echocardiogram shows an improvement in LV function which is reassuring. He's also been on some increased dose of Lasix recently for swelling but does not feel any different on the higher dose than he does currently on the 40 mg dose. He is tolerating Xarelto without bleeding problems. He is in A. fib today but is rate controlled and he is unaware of it. I strongly encourage work on weight loss and he says he recently signed up for weight management program in [place]. Hopefully this will be helpful for him. If he cannot lose additional weight, consideration should be given for referral for bariatric surgery if he is willing.   [name], MD, [facility] Attending Cardiologist [facility]  [name] 12/18/2015, 5:54 PM

## 2015-12-21 ENCOUNTER — Encounter: Payer: Self-pay | Admitting: Family Medicine

## 2016-01-15 ENCOUNTER — Ambulatory Visit (HOSPITAL_BASED_OUTPATIENT_CLINIC_OR_DEPARTMENT_OTHER): Payer: 59

## 2016-01-22 ENCOUNTER — Encounter (HOSPITAL_BASED_OUTPATIENT_CLINIC_OR_DEPARTMENT_OTHER): Payer: 59 | Attending: Internal Medicine

## 2016-01-22 DIAGNOSIS — I890 Lymphedema, not elsewhere classified: Secondary | ICD-10-CM | POA: Diagnosis not present

## 2016-01-22 DIAGNOSIS — M109 Gout, unspecified: Secondary | ICD-10-CM | POA: Diagnosis not present

## 2016-01-22 DIAGNOSIS — E1140 Type 2 diabetes mellitus with diabetic neuropathy, unspecified: Secondary | ICD-10-CM | POA: Diagnosis not present

## 2016-01-22 DIAGNOSIS — Z79899 Other long term (current) drug therapy: Secondary | ICD-10-CM | POA: Diagnosis not present

## 2016-01-22 DIAGNOSIS — I10 Essential (primary) hypertension: Secondary | ICD-10-CM | POA: Diagnosis not present

## 2016-01-22 DIAGNOSIS — I87333 Chronic venous hypertension (idiopathic) 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: Secondary | ICD-10-CM | POA: Insufficient documentation

## 2016-01-22 DIAGNOSIS — Z794 Long term (current) use of insulin: Secondary | ICD-10-CM | POA: Diagnosis not present

## 2016-01-22 DIAGNOSIS — N289 Disorder of kidney and ureter, unspecified: Secondary | ICD-10-CM | POA: Insufficient documentation

## 2016-01-22 DIAGNOSIS — E1151 Type 2 diabetes mellitus with diabetic peripheral angiopathy without gangrene: Secondary | ICD-10-CM | POA: Diagnosis not present

## 2016-01-22 DIAGNOSIS — E11622 Type 2 diabetes mellitus with other skin ulcer: Secondary | ICD-10-CM | POA: Insufficient documentation

## 2016-01-22 DIAGNOSIS — L97811 Non-pressure chronic ulcer of other part of right lower leg limited to breakdown of skin: Secondary | ICD-10-CM | POA: Diagnosis not present

## 2016-01-22 DIAGNOSIS — E1129 Type 2 diabetes mellitus with other diabetic kidney complication: Secondary | ICD-10-CM | POA: Insufficient documentation

## 2016-01-22 DIAGNOSIS — L97821 Non-pressure chronic ulcer of other part of left lower leg limited to breakdown of skin: Secondary | ICD-10-CM | POA: Diagnosis not present

## 2016-01-29 DIAGNOSIS — I87333 Chronic venous hypertension (idiopathic) 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: Secondary | ICD-10-CM | POA: Diagnosis not present

## 2016-02-01 ENCOUNTER — Encounter (HOSPITAL_COMMUNITY): Payer: 59

## 2016-02-05 ENCOUNTER — Encounter (HOSPITAL_BASED_OUTPATIENT_CLINIC_OR_DEPARTMENT_OTHER): Payer: 59 | Attending: Internal Medicine

## 2016-02-05 DIAGNOSIS — E1151 Type 2 diabetes mellitus with diabetic peripheral angiopathy without gangrene: Secondary | ICD-10-CM | POA: Insufficient documentation

## 2016-02-05 DIAGNOSIS — Z794 Long term (current) use of insulin: Secondary | ICD-10-CM | POA: Insufficient documentation

## 2016-02-05 DIAGNOSIS — R609 Edema, unspecified: Secondary | ICD-10-CM | POA: Diagnosis not present

## 2016-02-05 DIAGNOSIS — I10 Essential (primary) hypertension: Secondary | ICD-10-CM | POA: Diagnosis not present

## 2016-02-05 DIAGNOSIS — L97221 Non-pressure chronic ulcer of left calf limited to breakdown of skin: Secondary | ICD-10-CM | POA: Insufficient documentation

## 2016-02-05 DIAGNOSIS — L97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 right calf limited to breakdown of skin: Secondary | ICD-10-CM | POA: Diagnosis not present

## 2016-02-05 DIAGNOSIS — I482 Chronic atrial fibrillation: Secondary | ICD-10-CM | POA: Insufficient documentation

## 2016-02-05 DIAGNOSIS — L97811 Non-pressure chronic ulcer of other part of right lower leg limited to breakdown of skin: Secondary | ICD-10-CM | POA: Insufficient documentation

## 2016-02-05 DIAGNOSIS — I87333 Chronic venous hypertension (idiopathic) 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: Secondary | ICD-10-CM | POA: Diagnosis not present

## 2016-02-09 ENCOUNTER — Other Ambulatory Visit: Payer: Self-pay | Admitting: Internal Medicine

## 2016-02-09 ENCOUNTER — Ambulatory Visit (HOSPITAL_COMMUNITY)
Admission: RE | Admit: 2016-02-09 | Discharge: 2016-02-09 | Disposition: A | Discharge status: Home / Self Care | Payer: 59 | Source: Ambulatory Visit | Attending: Vascular Surgery | Admitting: Vascular Surgery

## 2016-02-09 DIAGNOSIS — E119 Type 2 diabetes mellitus without complications: Secondary | ICD-10-CM | POA: Diagnosis not present

## 2016-02-09 DIAGNOSIS — I5032 Chronic diastolic (congestive) heart failure: Secondary | ICD-10-CM | POA: Insufficient documentation

## 2016-02-09 DIAGNOSIS — L97909 Non-pressure chronic ulcer of unspecified part of unspecified lower leg with unspecified severity: Secondary | ICD-10-CM | POA: Insufficient documentation

## 2016-02-09 DIAGNOSIS — I110 Hypertensive heart disease with heart failure: Secondary | ICD-10-CM | POA: Diagnosis not present

## 2016-02-09 DIAGNOSIS — I87333 Chronic venous hypertension (idiopathic) 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: Secondary | ICD-10-CM | POA: Diagnosis not present

## 2016-02-12 ENCOUNTER — Encounter: Payer: 59 | Attending: Family Medicine | Admitting: Dietician

## 2016-02-12 ENCOUNTER — Encounter: Payer: Self-pay | Admitting: Dietician

## 2016-02-12 VITALS — Ht 70.0 in | Wt 386.0 lb

## 2016-02-12 DIAGNOSIS — I10 Essential (primary) hypertension: Secondary | ICD-10-CM | POA: Insufficient documentation

## 2016-02-12 DIAGNOSIS — E1140 Type 2 diabetes mellitus with diabetic neuropathy, unspecified: Secondary | ICD-10-CM | POA: Insufficient documentation

## 2016-02-12 DIAGNOSIS — I87333 Chronic venous hypertension (idiopathic) 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: Secondary | ICD-10-CM | POA: Diagnosis not present

## 2016-02-12 DIAGNOSIS — Z794 Long term (current) use of insulin: Secondary | ICD-10-CM | POA: Insufficient documentation

## 2016-02-12 DIAGNOSIS — E1142 Type 2 diabetes mellitus with diabetic polyneuropathy: Secondary | ICD-10-CM

## 2016-02-12 NOTE — Progress Notes (Signed)
Medical Nutrition Therapy:  Appt start time: 1045 end time:  1200.   Assessment:  Primary concerns today: Patient is here alone.  He would like to improve his eating habits.  The referral is for type 2 diabets with diabetic neuropathy and long term and current use of insulin, HTN and morbid obesity.  He currently has wounds on both lower legs that he is going to the wound center for.  His Hgb was 6.1% 12/11/15 and has continued to decreased for at least the last 2 years.  He checks his blood sugar 1-2 times per day depending on the number of strips that he has.  He has gotten his eyes examined and has been to the dentist in the past year.  He checks his feet daily.  He has not received any previous diabetes education.  His current protein intake is inadequate for optimal wound healing.  He is on a MVI but dietary intake of vitamin C and potentially zinc is low.  Weight hx: Highest 400 lbs Lowest 185 lbs in his late 20's after being part of the Optifast diet Current weight 386 lbs.  This is down from his UBW of 390 lbs.    He lives alone and does not cook at his home due to cost.  "I find it cheaper to eat out."  "It is more convenient to eat out."   Preferred Learning Style:   No preference indicated   Learning Readiness:   Ready  MEDICATIONS: see list to include insulin Humalog 75/25 (25 units each am and 0-20 units in the evening).   DIETARY INTAKE:  Usual eating pattern includes 2-3 meals and 0-1 snacks per day. Avoided foods include process meat due to high sodium intake.  24-hr recall:  B ( AM): egg biscuit and coffee from [employer] OR egg, ham and cheese croissant from [employer] or [employer]. Snk ( AM): rare  L ( PM): Salad from [facility] or other restaurant with honey mustard or vinegrette (this does not have protein at times) , or sandwich or pizza Snk ( PM): rare D ( PM): 2 handfulls honey nut cheerios, 4 tsp cashews, 2 tsp peanut butter last night but usually leftovers  from lunch OR canned peas Snk ( PM): occasional peanut butter Beverages: coffee with cream, water, occasional milk, occasional juice when blood sugar gets low, 3 regular soda's per week  Usual physical activity: ADL's  He may be eligible for Silver Sneakers program when he turns 65.  Estimated energy needs: 2000 calories 2225 g carbohydrates 150 g protein 56 g fat  Progress Towards Goal(s):  In progress.   Nutritional Diagnosis:  NB-1.1 Food and nutrition-related knowledge deficit As related to balance of carbohydrate, protein, and fat.  As evidenced by diet hx.    Intervention:  Nutrition counseling/edcuation on healthy eating for weight reduction, management of HTN and Type 2 diabetes provided.  Food models used to show portion size.  Discussed simple meals at home as well as healthier choices when eating out.  Discussed ways to increase the nutritional density and protein in the diet as well as importance for wound healing and overall health.  Provided samples of Premier Protein and Unjury.  Wheeled patient to parking lot as he was extremely short of breath coming into the appointment.  Be sure to have a good source of protein with each meal. (meat, milk, greek yogurt, cheese) Protein is very important for wound healing. Consider a protein shake such as Boost Glucose Control,  or Glucerna or Premier protein. Aim for 1/2 of your plate to be non starchy vegetables. Limit carbohydrates to around 30-45 grams per meal. Consider keeping easy things to prepare in your home such as chicken breast or fish and frozen vegetables.  Teaching Method Utilized:  Visual Auditory Hands on  Handouts given during visit include:  Label reading  Meal plan card  Calorie [email or web address] web site to be an informed consumer when eating out  My plate  Barriers to learning/adherence to lifestyle change: mobility  Demonstrated degree of understanding via:  Teach Back   Monitoring/Evaluation:  Dietary  intake, exercise, label reading, and body weight prn.

## 2016-02-12 NOTE — Patient Instructions (Signed)
Be sure to have a good source of protein with each meal. (meat, milk, greek yogurt, cheese) Protein is very important for wound healing. Consider a protein shake such as Boost Glucose Control, or Glucerna or Premier protein. Aim for 1/2 of your plate to be non starchy vegetables. Limit carbohydrates to around 30-45 grams per meal. Consider keeping easy things to prepare in your home such as chicken breast or fish and frozen vegetables.

## 2016-02-16 DIAGNOSIS — I87333 Chronic venous hypertension (idiopathic) 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: Secondary | ICD-10-CM | POA: Diagnosis not present

## 2016-02-19 ENCOUNTER — Encounter: Payer: Self-pay | Admitting: Family Medicine

## 2016-02-19 ENCOUNTER — Ambulatory Visit (INDEPENDENT_AMBULATORY_CARE_PROVIDER_SITE_OTHER): Payer: 59 | Admitting: Family Medicine

## 2016-02-19 VITALS — BP 144/80 | HR 71 | Temp 98.0°F | Resp 16 | Ht 69.0 in | Wt 382.5 lb

## 2016-02-19 DIAGNOSIS — I5032 Chronic diastolic (congestive) heart failure: Secondary | ICD-10-CM

## 2016-02-19 DIAGNOSIS — I87333 Chronic venous hypertension (idiopathic) 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: Secondary | ICD-10-CM | POA: Diagnosis not present

## 2016-02-19 DIAGNOSIS — R609 Edema, unspecified: Secondary | ICD-10-CM | POA: Diagnosis not present

## 2016-02-19 NOTE — Progress Notes (Signed)
Pre visit review using our clinic review tool, if applicable. No additional management support is needed unless otherwise documented below in the visit note. 

## 2016-02-19 NOTE — Progress Notes (Signed)
OFFICE VISIT  02/19/2016   CC:  Chief Complaint  Patient presents with  . Follow-up    from Dr. Leanord Hawkingobson apt   HPI:    Patient is a 65 y.o. Caucasian male who presents for f/u chronic LE edema, chronic diastolic CHF, and morbid obesity. Has been seeing wound care clinic specialists recently for venous stasis ulcers (for about the last month). His edema flared up recently to the point that the attending MD there, Dr. Leanord Hawkingobson, felt like he was possibly having some CHF contributing to his chronic venous insufficiency edema.  He therefore increased his lasix from 40mg  qd to 40mg  bid for the last 2 days.  Denies SOB at rest.  Describes his usual mild DOE with ambulation that he has always attributed to his weight, says he recovers quickly.  No CP.  No signif cough.  No fevers or malaise. Appetite has been down the last month or so since the worsened LE edema/ulcers. He does not elevated his legs.  No excess sodium in diet per pt.   No nasal congestion/URI sx's recently.  Past Medical History  Diagnosis Date  . DIABETES MELLITUS, TYPE II 08/11/2006  . HYPERTENSION 08/11/2006  . ALLERGIC RHINITIS 08/11/2006  . ASTHMA 08/11/2006  . DM W/RENAL MANIFESTATIONS, TYPE II 04/20/2007  . OBESITY, MORBID 12/14/2007  . Chronic atrial fibrillation (HCC) 08/2014  . Venous insufficiency of both lower extremities   . Chronic constipation   . Diastolic CHF [facility](HCC)     Past Surgical History  Procedure Laterality Date  . Ureteral stent placement    . Virtual colonoscopy  01/2011    Normal  . Transthoracic echocardiogram  11/2007; 09/2014; 11/2015    LV fxn normal, EF normal, mild dilation of left atrium.  2015 grade II diast dysfxn.  2017 EF 55-60%.    Outpatient Prescriptions Prior to Visit  Medication Sig Dispense Refill  . albuterol (PROVENTIL HFA;VENTOLIN HFA) 108 (90 Base) MCG/ACT inhaler Inhale 2 puffs into the lungs every 6 (six) hours as needed for wheezing or shortness of breath. 1 Inhaler 1  . b  complex vitamins capsule Take 1 capsule by mouth every morning.    . butalbital-acetaminophen-caffeine (FIORICET WITH CODEINE) 50-325-40-30 MG per capsule 1-2 caps po q6h prn headache 90 capsule 1  . cetirizine (ZYRTEC) 10 MG tablet Take 10 mg by mouth daily.    Marland Kitchen. diltiazem (TIAZAC) 360 MG 24 hr capsule TAKE 1 CAPSULE BY MOUTH  DAILY AS DIRECTED  EQUIVALENT TO TIAZAC 90 capsule 3  . furosemide (LASIX) 40 MG tablet Take 2 tablets (80 mg total) by mouth daily. 90 tablet 3  . HUMALOG MIX 75/25 KWIKPEN (75-25) 100 UNIT/ML Kwikpen Inject subcutaneously 25  units every morning and 20  units every evening at  supper 45 mL 1  . Insulin Pen Needle (BD ULTRA-FINE PEN NEEDLES) 29G X 12.7MM MISC 1 each by Other route 2 (two) times daily. 200 each 2  . Insulin Pen Needle (BD ULTRA-FINE PEN NEEDLES) 29G X 12.7MM MISC USE TWICE DAILY 200 each 3  . lisinopril (PRINIVIL,ZESTRIL) 40 MG tablet Take 1 tablet by mouth  every day 90 tablet 3  . metFORMIN (GLUCOPHAGE) 1000 MG tablet Take 1 tablet by mouth  twice a day with meals 180 tablet 1  . metoprolol (LOPRESSOR) 50 MG tablet Take 1 and 1/2 tablets by  mouth two times daily 270 tablet 3  . Multiple Vitamin (MULTIVITAMIN) tablet Take 1 tablet by mouth daily.    . nitrofurantoin,  macrocrystal-monohydrate, (MACROBID) 100 MG capsule Take 1 capsule by mouth  every week as needed 4 capsule 6  . saw palmetto 500 MG capsule Take 500 mg by mouth daily.    Marland Kitchen terazosin (HYTRIN) 10 MG capsule TAKE 1 CAPSULE BY MOUTH  ONCE A DAY AT BEDTIME 90 capsule 3  . vitamin E 400 UNIT capsule Take 400 Units by mouth every morning. Selenium     . XARELTO 20 MG TABS tablet Take 1 tablet by mouth  daily with supper 90 tablet 1  . Cinnamon 500 MG TABS Take by mouth. Reported on 02/19/2016    . Coenzyme Q10 (CO Q 10) 10 MG CAPS Take by mouth. Reported on 02/19/2016     No facility-administered medications prior to visit.    No Known Allergies  ROS As per HPI  PE: Blood pressure  144/80, pulse 71, temperature 98 F (36.7 C), temperature source Oral, resp. rate 16, height  (1.753 m), weight 382 lb 8 oz (173.501 kg), SpO2 93 %. Gen: Alert, well appearing.  Patient is oriented to person, place, time, and situation. CV: irreg irreg, rate about 75-80, no m/r/g.   LUNGS: CTA bilat, nonlabored resps, good aeration in all lung fields. Legs: his lower legs are both wrapped in compression dressings so I did not take these off today. Toes appeared pink and edematous.  LABS:    Chemistry      Component Value Date/Time   NA 139 12/11/2015 0950   K 4.4 12/11/2015 0950   CL 101 12/11/2015 0950   CO2 29 12/11/2015 0950   BUN 15 12/11/2015 0950   CREATININE 1.08 12/11/2015 0950   CREATININE 1.24 10/27/2015 1627      Component Value Date/Time   CALCIUM 8.8 12/11/2015 0950   ALKPHOS 54 06/05/2015 1003   AST 12 06/05/2015 1003   ALT 12 06/05/2015 1003   BILITOT 0.9 06/05/2015 1003     IMPRESSION AND PLAN:  1) LE edema with stasis ulcers: combo of venous insufficiency and mild RHF.  No sign of left heart failure at this time. He may be developing some obesity hypoventilation syndrome which is driving his pulm pressures/RV pressures up. Continue lasix  bid and we'll check a BMET today--will forward results to Dr. Leanord Hawking at wound care center so he is aware.  Per pt's report his wt is down 2-3 lbs compared to a few days ago (by the wt measurements in our office, he is down 16 lbs since last visit 12/18/15. He'll continue to attend wound care clinic twice per week.  2) Morbid obesity: discussed the possibility of bariatric surgery today but when I broached the topic, the patient relayed a story about his sister having the surgery and the surgeon ended up finding a malignancy and "opening her up just set the cancer off".  Pt is too disturbed by this memory to even consider bariatric surgery as an option for himself at this time.  An After Visit Summary was printed and  given to the patient.  FOLLOW UP: Return for keep appt already set with me June 2017.  Signed:  Santiago Bumpers, MD           02/19/2016

## 2016-02-20 LAB — BASIC METABOLIC PANEL
BUN: 14 mg/dL (ref 6–23)
CHLORIDE: 100 meq/L (ref 96–112)
CO2: 32 mEq/L (ref 19–32)
CREATININE: 1.25 mg/dL (ref 0.40–1.50)
Calcium: 8.9 mg/dL (ref 8.4–10.5)
GFR: 61.63 mL/min (ref >60.00)
Glucose, Bld: 123 mg/dL — ABNORMAL HIGH (ref 70–99)
Potassium: 4.8 mEq/L (ref 3.5–5.1)
Sodium: 139 mEq/L (ref 135–145)

## 2016-02-22 ENCOUNTER — Other Ambulatory Visit: Payer: Self-pay | Admitting: *Deleted

## 2016-02-22 DIAGNOSIS — R609 Edema, unspecified: Secondary | ICD-10-CM

## 2016-02-22 DIAGNOSIS — I890 Lymphedema, not elsewhere classified: Secondary | ICD-10-CM | POA: Insufficient documentation

## 2016-02-22 NOTE — Addendum Note (Signed)
Addended by: Smitty KnudsenSUTHERLAND, HEATHER K on: 02/22/2016 09:28 AM   Modules accepted: Orders

## 2016-02-23 DIAGNOSIS — I87333 Chronic venous hypertension (idiopathic) 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: Secondary | ICD-10-CM | POA: Diagnosis not present

## 2016-02-26 ENCOUNTER — Telehealth: Payer: Self-pay | Admitting: *Deleted

## 2016-02-26 ENCOUNTER — Ambulatory Visit: Payer: 59 | Admitting: Family Medicine

## 2016-02-26 DIAGNOSIS — I87333 Chronic venous hypertension (idiopathic) 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: Secondary | ICD-10-CM | POA: Diagnosis not present

## 2016-02-26 NOTE — Telephone Encounter (Signed)
[name] called back and stated that they did get req that was faxed over last week.

## 2016-02-26 NOTE — Telephone Encounter (Signed)
Pt LMOM on 02/26/16 at 10:36am wanting to know if he needed to p/u req to [facility]. I advised pt that I faxed req last week [facility] but I would call to see if they received it. Left message for [facility] to call back.

## 2016-02-28 ENCOUNTER — Other Ambulatory Visit: Payer: Self-pay | Admitting: Family Medicine

## 2016-02-28 LAB — BASIC METABOLIC PANEL
BUN: 14 mg/dL (ref 7–25)
CHLORIDE: 99 mmol/L (ref 98–110)
CO2: 24 mmol/L (ref 20–31)
CREATININE: 1.02 mg/dL (ref 0.70–1.25)
Calcium: 8.6 mg/dL (ref 8.6–10.3)
Glucose, Bld: 119 mg/dL — ABNORMAL HIGH (ref 65–99)
Potassium: 4.4 mmol/L (ref 3.5–5.3)
Sodium: 139 mmol/L (ref 135–146)

## 2016-03-04 ENCOUNTER — Encounter (HOSPITAL_BASED_OUTPATIENT_CLINIC_OR_DEPARTMENT_OTHER): Payer: 59 | Attending: Internal Medicine

## 2016-03-04 DIAGNOSIS — E1151 Type 2 diabetes mellitus with diabetic peripheral angiopathy without gangrene: Secondary | ICD-10-CM | POA: Diagnosis not present

## 2016-03-04 DIAGNOSIS — Z7901 Long term (current) use of anticoagulants: Secondary | ICD-10-CM | POA: Insufficient documentation

## 2016-03-04 DIAGNOSIS — Z794 Long term (current) use of insulin: Secondary | ICD-10-CM | POA: Insufficient documentation

## 2016-03-04 DIAGNOSIS — I10 Essential (primary) hypertension: Secondary | ICD-10-CM | POA: Insufficient documentation

## 2016-03-04 DIAGNOSIS — Z79899 Other long term (current) drug therapy: Secondary | ICD-10-CM | POA: Insufficient documentation

## 2016-03-04 DIAGNOSIS — L97811 Non-pressure chronic ulcer of other part of right lower leg limited to breakdown of skin: Secondary | ICD-10-CM | POA: Insufficient documentation

## 2016-03-04 DIAGNOSIS — I87333 Chronic venous hypertension (idiopathic) 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: Secondary | ICD-10-CM | POA: Insufficient documentation

## 2016-03-04 DIAGNOSIS — I890 Lymphedema, not elsewhere classified: Secondary | ICD-10-CM | POA: Diagnosis not present

## 2016-03-04 DIAGNOSIS — L97821 Non-pressure chronic ulcer of other part of left lower leg limited to breakdown of skin: Secondary | ICD-10-CM | POA: Diagnosis not present

## 2016-03-04 DIAGNOSIS — M109 Gout, unspecified: Secondary | ICD-10-CM | POA: Diagnosis not present

## 2016-03-04 DIAGNOSIS — E11622 Type 2 diabetes mellitus with other skin ulcer: Secondary | ICD-10-CM | POA: Insufficient documentation

## 2016-03-08 ENCOUNTER — Encounter (HOSPITAL_BASED_OUTPATIENT_CLINIC_OR_DEPARTMENT_OTHER): Payer: 59

## 2016-03-11 ENCOUNTER — Encounter (HOSPITAL_BASED_OUTPATIENT_CLINIC_OR_DEPARTMENT_OTHER): Payer: 59 | Attending: Internal Medicine

## 2016-03-11 DIAGNOSIS — M109 Gout, unspecified: Secondary | ICD-10-CM | POA: Insufficient documentation

## 2016-03-11 DIAGNOSIS — L97811 Non-pressure chronic ulcer of other part of right lower leg limited to breakdown of skin: Secondary | ICD-10-CM | POA: Insufficient documentation

## 2016-03-11 DIAGNOSIS — E11622 Type 2 diabetes mellitus with other skin ulcer: Secondary | ICD-10-CM | POA: Insufficient documentation

## 2016-03-11 DIAGNOSIS — Z794 Long term (current) use of insulin: Secondary | ICD-10-CM | POA: Diagnosis not present

## 2016-03-11 DIAGNOSIS — Z79899 Other long term (current) drug therapy: Secondary | ICD-10-CM | POA: Insufficient documentation

## 2016-03-11 DIAGNOSIS — Z7901 Long term (current) use of anticoagulants: Secondary | ICD-10-CM | POA: Insufficient documentation

## 2016-03-11 DIAGNOSIS — I890 Lymphedema, not elsewhere classified: Secondary | ICD-10-CM | POA: Diagnosis not present

## 2016-03-11 DIAGNOSIS — I87333 Chronic venous hypertension (idiopathic) 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: Secondary | ICD-10-CM | POA: Diagnosis not present

## 2016-03-11 DIAGNOSIS — L97821 Non-pressure chronic ulcer of other part of left lower leg limited to breakdown of skin: Secondary | ICD-10-CM | POA: Insufficient documentation

## 2016-03-11 DIAGNOSIS — I10 Essential (primary) hypertension: Secondary | ICD-10-CM | POA: Diagnosis not present

## 2016-03-11 DIAGNOSIS — E1151 Type 2 diabetes mellitus with diabetic peripheral angiopathy without gangrene: Secondary | ICD-10-CM | POA: Diagnosis not present

## 2016-03-15 DIAGNOSIS — I87333 Chronic venous hypertension (idiopathic) 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: Secondary | ICD-10-CM | POA: Diagnosis not present

## 2016-03-18 DIAGNOSIS — I87333 Chronic venous hypertension (idiopathic) 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: Secondary | ICD-10-CM | POA: Diagnosis not present

## 2016-03-25 DIAGNOSIS — I87333 Chronic venous hypertension (idiopathic) 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: Secondary | ICD-10-CM | POA: Diagnosis not present

## 2016-03-28 DIAGNOSIS — I87333 Chronic venous hypertension (idiopathic) 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: Secondary | ICD-10-CM | POA: Diagnosis not present

## 2016-03-29 ENCOUNTER — Other Ambulatory Visit: Payer: Self-pay | Admitting: *Deleted

## 2016-03-29 MED ORDER — BUTALBITAL-APAP-CAFF-COD 50-325-40-30 MG PO CAPS: ORAL_CAPSULE | ORAL | Status: DC

## 2016-03-29 NOTE — Telephone Encounter (Signed)
Rx faxed

## 2016-03-29 NOTE — Telephone Encounter (Signed)
[facility] fax #: [phone]  RF request for buta/apap/caf cp cod LOV: 02/19/16 Next ov: 04/22/16 Last written: 04/14/15 #90 w/ 1Rf  Please advise. Thanks.

## 2016-04-08 ENCOUNTER — Encounter (HOSPITAL_BASED_OUTPATIENT_CLINIC_OR_DEPARTMENT_OTHER): Payer: 59 | Attending: Internal Medicine

## 2016-04-08 ENCOUNTER — Ambulatory Visit: Payer: 59 | Admitting: Family Medicine

## 2016-04-08 DIAGNOSIS — E1151 Type 2 diabetes mellitus with diabetic peripheral angiopathy without gangrene: Secondary | ICD-10-CM | POA: Insufficient documentation

## 2016-04-08 DIAGNOSIS — E11622 Type 2 diabetes mellitus with other skin ulcer: Secondary | ICD-10-CM | POA: Insufficient documentation

## 2016-04-08 DIAGNOSIS — L97521 Non-pressure chronic ulcer of other 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 of skin: Secondary | ICD-10-CM | POA: Diagnosis not present

## 2016-04-08 DIAGNOSIS — Z794 Long term (current) use of insulin: Secondary | ICD-10-CM | POA: Diagnosis not present

## 2016-04-08 DIAGNOSIS — I499 Cardiac arrhythmia, unspecified: Secondary | ICD-10-CM | POA: Diagnosis not present

## 2016-04-08 DIAGNOSIS — L97821 Non-pressure chronic ulcer of other part of left lower leg limited to breakdown of skin: Secondary | ICD-10-CM | POA: Diagnosis not present

## 2016-04-08 DIAGNOSIS — I87332 Chronic venous hypertension (idiopathic) with ulcer and inflammation of left lower extremity: Secondary | ICD-10-CM | POA: Insufficient documentation

## 2016-04-08 DIAGNOSIS — I890 Lymphedema, not elsewhere classified: Secondary | ICD-10-CM | POA: Diagnosis not present

## 2016-04-08 DIAGNOSIS — I87333 Chronic venous hypertension (idiopathic) 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: Secondary | ICD-10-CM | POA: Insufficient documentation

## 2016-04-08 DIAGNOSIS — M109 Gout, unspecified: Secondary | ICD-10-CM | POA: Diagnosis not present

## 2016-04-08 DIAGNOSIS — L97221 Non-pressure chronic ulcer of left calf limited to breakdown of skin: Secondary | ICD-10-CM | POA: Insufficient documentation

## 2016-04-08 DIAGNOSIS — Z79899 Other long term (current) drug therapy: Secondary | ICD-10-CM | POA: Diagnosis not present

## 2016-04-08 DIAGNOSIS — I10 Essential (primary) hypertension: Secondary | ICD-10-CM | POA: Insufficient documentation

## 2016-04-08 DIAGNOSIS — E1140 Type 2 diabetes mellitus with diabetic neuropathy, unspecified: Secondary | ICD-10-CM | POA: Diagnosis not present

## 2016-04-15 DIAGNOSIS — I87333 Chronic venous hypertension (idiopathic) 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: Secondary | ICD-10-CM | POA: Diagnosis not present

## 2016-04-21 ENCOUNTER — Other Ambulatory Visit: Payer: Self-pay | Admitting: Family Medicine

## 2016-04-22 ENCOUNTER — Encounter: Payer: Self-pay | Admitting: Family Medicine

## 2016-04-22 ENCOUNTER — Ambulatory Visit: Payer: 59 | Admitting: Family Medicine

## 2016-04-22 ENCOUNTER — Ambulatory Visit (INDEPENDENT_AMBULATORY_CARE_PROVIDER_SITE_OTHER): Payer: 59 | Admitting: Family Medicine

## 2016-04-22 VITALS — BP 130/64 | HR 70 | Temp 97.7°F | Resp 16 | Ht 69.0 in | Wt 374.5 lb

## 2016-04-22 DIAGNOSIS — I872 Venous insufficiency (chronic) (peripheral): Secondary | ICD-10-CM | POA: Diagnosis not present

## 2016-04-22 DIAGNOSIS — Z0000 Encounter for general adult medical examination without abnormal findings: Secondary | ICD-10-CM

## 2016-04-22 DIAGNOSIS — I10 Essential (primary) hypertension: Secondary | ICD-10-CM

## 2016-04-22 DIAGNOSIS — I482 Chronic atrial fibrillation, unspecified: Secondary | ICD-10-CM

## 2016-04-22 DIAGNOSIS — E118 Type 2 diabetes mellitus with unspecified complications: Secondary | ICD-10-CM

## 2016-04-22 DIAGNOSIS — I87333 Chronic venous hypertension (idiopathic) 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: Secondary | ICD-10-CM | POA: Diagnosis not present

## 2016-04-22 LAB — BASIC METABOLIC PANEL
BUN: 18 mg/dL (ref 6–23)
CALCIUM: 9.1 mg/dL (ref 8.4–10.5)
CHLORIDE: 102 meq/L (ref 96–112)
CO2: 30 meq/L (ref 19–32)
Creatinine, Ser: 1.11 mg/dL (ref 0.40–1.50)
GFR: 70.65 mL/min (ref >60.00)
GLUCOSE: 182 mg/dL — AB (ref 70–99)
Potassium: 4.8 mEq/L (ref 3.5–5.1)
SODIUM: 141 meq/L (ref 135–145)

## 2016-04-22 LAB — HEMOGLOBIN A1C: Hgb A1c MFr Bld: 6.4 % (ref 4.6–6.5)

## 2016-04-22 NOTE — Progress Notes (Signed)
Pre visit review using our clinic review tool, if applicable. No additional management support is needed unless otherwise documented below in the visit note. 

## 2016-04-22 NOTE — Telephone Encounter (Signed)
LOV: 12/11/15 NOV: 04/22/16  RF request for Humalog Last written: 09/18/15 #45 w/ 1RF  RF request for metformin Last written: 09/18/15 #180 w/ 1RF  RF request for terazosin Last written: 04/19/15 #90 w/ 3RF

## 2016-04-22 NOTE — Progress Notes (Signed)
OFFICE VISIT  04/22/2016   CC:  Chief Complaint  Patient presents with  . Follow-up    Pt is not fasting.    HPI:    Patient is a 65 y.o. Caucasian male who presents for 4 mo f/u DM 2, HTN, chronic a-fib, and chronic LE venous insufficiency edema with an open wound recently (has been seeing wound care clinic).  Unfortunately pt is in a hurry today due to having a wound care appt across town.  DM: home gluc's 90-120 fasting.  160s later in the day.   He self-adjusts insulin dosing and sometimes holds dose if sugar not up high enough.    BP: no home monitoring.  Compliant with meds.   Venous insuff: taking diuretics, watching Na in diet, going to wound clinic weekly.  Currently on keflex b/c of some open wounds on LL's due to excess edema.  A-fib: denies palpitations or racing heart, denies dizziness.  He is compliant with his rate control med and anticoagulant.   ROS: no melena, hematochezia, nosebleeds, or hematuria.  No fevers.  Past Medical History  Diagnosis Date  . DIABETES MELLITUS, TYPE II 08/11/2006  . HYPERTENSION 08/11/2006  . ALLERGIC RHINITIS 08/11/2006  . ASTHMA 08/11/2006  . DM W/RENAL MANIFESTATIONS, TYPE II 04/20/2007  . OBESITY, MORBID 12/14/2007  . Chronic atrial fibrillation (HCC) 08/2014  . Venous insufficiency of both lower extremities   . Chronic constipation   . Diastolic CHF [facility](HCC)     Past Surgical History  Procedure Laterality Date  . Ureteral stent placement    . Virtual colonoscopy  01/2011    Normal  . Transthoracic echocardiogram  11/2007; 09/2014; 11/2015    LV fxn normal, EF normal, mild dilation of left atrium.  2015 grade II diast dysfxn.  2017 EF 55-60%.    Outpatient Prescriptions Prior to Visit  Medication Sig Dispense Refill  . albuterol (PROVENTIL HFA;VENTOLIN HFA) 108 (90 Base) MCG/ACT inhaler Inhale 2 puffs into the lungs every 6 (six) hours as needed for wheezing or shortness of breath. 1 Inhaler 1  . b complex vitamins capsule Take 1  capsule by mouth every morning.    . butalbital-acetaminophen-caffeine (FIORICET WITH CODEINE) 50-325-40-30 MG capsule 1-2 caps po q6h prn headache 90 capsule 1  . cetirizine (ZYRTEC) 10 MG tablet Take 10 mg by mouth daily.    . Cinnamon 500 MG TABS Take by mouth. Reported on 02/19/2016    . Coenzyme Q10 (CO Q 10) 10 MG CAPS Take by mouth. Reported on 02/19/2016    . diltiazem (TIAZAC) 360 MG 24 hr capsule TAKE 1 CAPSULE BY MOUTH  DAILY AS DIRECTED  EQUIVALENT TO TIAZAC 90 capsule 3  . furosemide (LASIX) 40 MG tablet Take 2 tablets (80 mg total) by mouth daily. 90 tablet 3  . HUMALOG MIX 75/25 KWIKPEN (75-25) 100 UNIT/ML Kwikpen Inject subcutaneously 25  units every morning and 20  units every evening at  supper 45 mL 1  . Insulin Pen Needle (BD ULTRA-FINE PEN NEEDLES) 29G X 12.7MM MISC 1 each by Other route 2 (two) times daily. 200 each 2  . Insulin Pen Needle (BD ULTRA-FINE PEN NEEDLES) 29G X 12.7MM MISC USE TWICE DAILY 200 each 3  . lisinopril (PRINIVIL,ZESTRIL) 40 MG tablet Take 1 tablet by mouth  every day 90 tablet 3  . metFORMIN (GLUCOPHAGE) 1000 MG tablet Take 1 tablet by mouth  twice a day with meals 180 tablet 1  . metoprolol (LOPRESSOR) 50 MG tablet Take 1  and 1/2 tablets by  mouth two times daily 270 tablet 3  . Multiple Vitamin (MULTIVITAMIN) tablet Take 1 tablet by mouth daily.    . nitrofurantoin, macrocrystal-monohydrate, (MACROBID) 100 MG capsule Take 1 capsule by mouth  every week as needed 4 capsule 6  . saw palmetto 500 MG capsule Take 500 mg by mouth daily.    Marland Kitchen terazosin (HYTRIN) 10 MG capsule Take 1 capsule by mouth  once a day at bedtime 90 capsule 3  . vitamin E 400 UNIT capsule Take 400 Units by mouth every morning. Selenium     . XARELTO 20 MG TABS tablet Take 1 tablet by mouth  daily with supper 90 tablet 1   No facility-administered medications prior to visit.    No Known Allergies  ROS As per HPI  PE: Blood pressure 130/64, pulse 70, temperature 97.7 F  (36.5 C), temperature source Oral, resp. rate 16, height  (1.753 m), weight 374 lb 8 oz (169.872 kg), SpO2 93 %. Gen: Alert, well appearing.  Patient is oriented to person, place, time, and situation. AFFECT: pleasant, lucid thought and speech. He has compression stockings on bilat, with additional gauze dressing on L LE.  I did not remove these today. No further exam today.  LABS:  Lab Results  Component Value Date   TSH 3.22 06/05/2015   Lab Results  Component Value Date   WBC 7.4 10/24/2015   HGB 11.5* 10/24/2015   HCT 36.0* 10/24/2015   MCV 84.7 10/24/2015   PLT 254 10/24/2015   Lab Results  Component Value Date   CREATININE 1.02 02/28/2016   BUN 14 02/28/2016   NA 139 02/28/2016   K 4.4 02/28/2016   CL 99 02/28/2016   CO2 24 02/28/2016   Lab Results  Component Value Date   ALT 12 06/05/2015   AST 12 06/05/2015   ALKPHOS 54 06/05/2015   BILITOT 0.9 06/05/2015   Lab Results  Component Value Date   CHOL 85 12/11/2015   Lab Results  Component Value Date   HDL 33.70* 12/11/2015   Lab Results  Component Value Date   LDLCALC 41 12/11/2015   Lab Results  Component Value Date   TRIG 48.0 12/11/2015   Lab Results  Component Value Date   CHOLHDL 3 12/11/2015   Lab Results  Component Value Date   PSA 0.21 12/11/2015   PSA 0.37 08/15/2014   Lab Results  Component Value Date   HGBA1C 6.1 12/11/2015    IMPRESSION AND PLAN:  1) DM 2; good control. No med changes today. Hba1c drawn today.  2) HTN; The current medical regimen is effective;  continue present plan and medications. Lytes/cr today.  3) Chronic LE venous insufficiency edema with stasis ulcers: continue low Na diet, current diuretic dosing, and wound care clinic.  Monitoring lytes/cr today given his diuretic dosing + pt on ACE-I.  4) Chronic atrial fibrillation: rate control + xarelto--stable.  5) Preventative health care: he'll need a pneumovax booster at an upcoming f/u visit since  he turned 65 recently and it has been >5 yrs since his last pneumovax.  An After Visit Summary was printed and given to the patient.  FOLLOW UP: Return in about 4 months (around 08/22/2016) for routine chronic illness f/u (30 min).  Signed:  Santiago Bumpers, MD           04/22/2016

## 2016-04-25 DIAGNOSIS — I87333 Chronic venous hypertension (idiopathic) 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: Secondary | ICD-10-CM | POA: Diagnosis not present

## 2016-04-29 DIAGNOSIS — I87333 Chronic venous hypertension (idiopathic) 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: Secondary | ICD-10-CM | POA: Diagnosis not present

## 2016-04-30 ENCOUNTER — Other Ambulatory Visit: Payer: Self-pay | Admitting: Family Medicine

## 2016-05-02 ENCOUNTER — Other Ambulatory Visit: Payer: Self-pay | Admitting: *Deleted

## 2016-05-02 MED ORDER — RIVAROXABAN 20 MG PO TABS: ORAL_TABLET | ORAL | Status: DC

## 2016-05-02 NOTE — Telephone Encounter (Signed)
Pt LMOM on 05/02/16 at 3:52pm [facility]   RF request for xarelto LOV: 04/22/16 Next ov: 08/12/16 Last written: 11/07/15 #90 w/ 1RF  Rx sent. Left detailed message on cell vm, okay per DPR.

## 2016-05-03 DIAGNOSIS — I87333 Chronic venous hypertension (idiopathic) 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: Secondary | ICD-10-CM | POA: Diagnosis not present

## 2016-05-06 ENCOUNTER — Encounter (HOSPITAL_BASED_OUTPATIENT_CLINIC_OR_DEPARTMENT_OTHER): Payer: 59 | Attending: Internal Medicine

## 2016-05-06 DIAGNOSIS — B353 Tinea pedis: Secondary | ICD-10-CM | POA: Insufficient documentation

## 2016-05-06 DIAGNOSIS — I87332 Chronic venous hypertension (idiopathic) with ulcer and inflammation of left lower extremity: Secondary | ICD-10-CM | POA: Insufficient documentation

## 2016-05-06 DIAGNOSIS — I110 Hypertensive heart disease with heart failure: Secondary | ICD-10-CM | POA: Diagnosis not present

## 2016-05-06 DIAGNOSIS — L03119 Cellulitis of unspecified part of limb: Secondary | ICD-10-CM | POA: Diagnosis not present

## 2016-05-06 DIAGNOSIS — L97521 Non-pressure chronic ulcer of other 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 of skin: Secondary | ICD-10-CM | POA: Insufficient documentation

## 2016-05-06 DIAGNOSIS — B9562 Methicillin resistant Staphylococcus aureus infection as the cause of diseases classified elsewhere: Secondary | ICD-10-CM | POA: Diagnosis not present

## 2016-05-06 DIAGNOSIS — H00039 Abscess of eyelid unspecified eye, unspecified eyelid: Secondary | ICD-10-CM | POA: Diagnosis not present

## 2016-05-06 DIAGNOSIS — L97821 Non-pressure chronic ulcer of other part of left lower leg limited to breakdown of skin: Secondary | ICD-10-CM | POA: Diagnosis not present

## 2016-05-06 DIAGNOSIS — E11622 Type 2 diabetes mellitus with other skin ulcer: Secondary | ICD-10-CM | POA: Insufficient documentation

## 2016-05-06 DIAGNOSIS — E1151 Type 2 diabetes mellitus with diabetic peripheral angiopathy without gangrene: Secondary | ICD-10-CM | POA: Insufficient documentation

## 2016-05-06 DIAGNOSIS — I509 Heart failure, unspecified: Secondary | ICD-10-CM | POA: Insufficient documentation

## 2016-05-06 DIAGNOSIS — E11621 Type 2 diabetes mellitus with foot ulcer: Secondary | ICD-10-CM | POA: Diagnosis not present

## 2016-05-06 DIAGNOSIS — L97221 Non-pressure chronic ulcer of left calf limited to breakdown of skin: Secondary | ICD-10-CM | POA: Diagnosis not present

## 2016-05-09 DIAGNOSIS — E11621 Type 2 diabetes mellitus with foot ulcer: Secondary | ICD-10-CM | POA: Diagnosis not present

## 2016-05-13 DIAGNOSIS — E11621 Type 2 diabetes mellitus with foot ulcer: Secondary | ICD-10-CM | POA: Diagnosis not present

## 2016-05-20 DIAGNOSIS — E11621 Type 2 diabetes mellitus with foot ulcer: Secondary | ICD-10-CM | POA: Diagnosis not present

## 2016-05-22 ENCOUNTER — Other Ambulatory Visit: Payer: Self-pay | Admitting: Family Medicine

## 2016-05-22 NOTE — Telephone Encounter (Signed)
RF request for lisinopril LOV: 04/22/16 Next ov: 08/12/16 Last written: 07/27/15 #90 w/ 3RF

## 2016-05-23 DIAGNOSIS — E11621 Type 2 diabetes mellitus with foot ulcer: Secondary | ICD-10-CM | POA: Diagnosis not present

## 2016-05-27 ENCOUNTER — Telehealth: Payer: Self-pay | Admitting: *Deleted

## 2016-05-27 DIAGNOSIS — E11621 Type 2 diabetes mellitus with foot ulcer: Secondary | ICD-10-CM | POA: Diagnosis not present

## 2016-05-27 NOTE — Telephone Encounter (Signed)
Noted  

## 2016-05-27 NOTE — Telephone Encounter (Signed)
FYI. Pt LMOM on 05/27/16 at 1:14pm stating that he only received 90 tablets for his furosemide from his mail order pharmacy. He stated that his wound care doctor increased this medication to BID and had now increased it to 2 and a half tablets daily to help get rid of the fluid on his legs. I advised pt to contact wound care to see if they would send in new Rx with these changes. He voiced understanding.

## 2016-05-30 DIAGNOSIS — E11621 Type 2 diabetes mellitus with foot ulcer: Secondary | ICD-10-CM | POA: Diagnosis not present

## 2016-06-03 DIAGNOSIS — E11621 Type 2 diabetes mellitus with foot ulcer: Secondary | ICD-10-CM | POA: Diagnosis not present

## 2016-06-07 ENCOUNTER — Encounter (HOSPITAL_BASED_OUTPATIENT_CLINIC_OR_DEPARTMENT_OTHER): Payer: 59 | Attending: Internal Medicine

## 2016-06-07 DIAGNOSIS — E1140 Type 2 diabetes mellitus with diabetic neuropathy, unspecified: Secondary | ICD-10-CM | POA: Diagnosis not present

## 2016-06-07 DIAGNOSIS — E1151 Type 2 diabetes mellitus with diabetic peripheral angiopathy without gangrene: Secondary | ICD-10-CM | POA: Diagnosis not present

## 2016-06-07 DIAGNOSIS — L97521 Non-pressure chronic ulcer of other 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 of skin: Secondary | ICD-10-CM | POA: Insufficient documentation

## 2016-06-07 DIAGNOSIS — I10 Essential (primary) hypertension: Secondary | ICD-10-CM | POA: Diagnosis not present

## 2016-06-07 DIAGNOSIS — I4891 Unspecified atrial fibrillation: Secondary | ICD-10-CM | POA: Insufficient documentation

## 2016-06-07 DIAGNOSIS — I890 Lymphedema, not elsewhere classified: Secondary | ICD-10-CM | POA: Insufficient documentation

## 2016-06-07 DIAGNOSIS — M109 Gout, unspecified: Secondary | ICD-10-CM | POA: Insufficient documentation

## 2016-06-07 DIAGNOSIS — L97821 Non-pressure chronic ulcer of other part of left lower leg limited to breakdown of skin: Secondary | ICD-10-CM | POA: Diagnosis not present

## 2016-06-07 DIAGNOSIS — E11622 Type 2 diabetes mellitus with other skin ulcer: Secondary | ICD-10-CM | POA: Insufficient documentation

## 2016-06-07 DIAGNOSIS — I87332 Chronic venous hypertension (idiopathic) with ulcer and inflammation of left lower extremity: Secondary | ICD-10-CM | POA: Diagnosis present

## 2016-06-07 DIAGNOSIS — I87312 Chronic venous hypertension (idiopathic) with ulcer of left lower extremity: Secondary | ICD-10-CM | POA: Insufficient documentation

## 2016-06-10 ENCOUNTER — Encounter (HOSPITAL_BASED_OUTPATIENT_CLINIC_OR_DEPARTMENT_OTHER): Payer: 59

## 2016-06-10 DIAGNOSIS — E11622 Type 2 diabetes mellitus with other skin ulcer: Secondary | ICD-10-CM | POA: Diagnosis not present

## 2016-06-12 DIAGNOSIS — E11622 Type 2 diabetes mellitus with other skin ulcer: Secondary | ICD-10-CM | POA: Diagnosis not present

## 2016-06-17 DIAGNOSIS — E11622 Type 2 diabetes mellitus with other skin ulcer: Secondary | ICD-10-CM | POA: Diagnosis not present

## 2016-06-20 DIAGNOSIS — E11622 Type 2 diabetes mellitus with other skin ulcer: Secondary | ICD-10-CM | POA: Diagnosis not present

## 2016-06-24 DIAGNOSIS — E11622 Type 2 diabetes mellitus with other skin ulcer: Secondary | ICD-10-CM | POA: Diagnosis not present

## 2016-07-01 DIAGNOSIS — E11622 Type 2 diabetes mellitus with other skin ulcer: Secondary | ICD-10-CM | POA: Diagnosis not present

## 2016-07-02 ENCOUNTER — Other Ambulatory Visit: Payer: Self-pay | Admitting: Family Medicine

## 2016-07-15 ENCOUNTER — Encounter (HOSPITAL_BASED_OUTPATIENT_CLINIC_OR_DEPARTMENT_OTHER): Payer: 59 | Attending: Internal Medicine

## 2016-07-15 DIAGNOSIS — L97521 Non-pressure chronic ulcer of other 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 of skin: Secondary | ICD-10-CM | POA: Insufficient documentation

## 2016-07-15 DIAGNOSIS — E11622 Type 2 diabetes mellitus with other skin ulcer: Secondary | ICD-10-CM | POA: Insufficient documentation

## 2016-07-15 DIAGNOSIS — E1140 Type 2 diabetes mellitus with diabetic neuropathy, unspecified: Secondary | ICD-10-CM | POA: Diagnosis not present

## 2016-07-15 DIAGNOSIS — E11621 Type 2 diabetes mellitus with foot ulcer: Secondary | ICD-10-CM | POA: Insufficient documentation

## 2016-07-15 DIAGNOSIS — I87332 Chronic venous hypertension (idiopathic) with ulcer and inflammation of left lower extremity: Secondary | ICD-10-CM | POA: Diagnosis not present

## 2016-07-15 DIAGNOSIS — I890 Lymphedema, not elsewhere classified: Secondary | ICD-10-CM | POA: Insufficient documentation

## 2016-07-15 DIAGNOSIS — L97821 Non-pressure chronic ulcer of other part of left lower leg limited to breakdown of skin: Secondary | ICD-10-CM | POA: Insufficient documentation

## 2016-07-18 DIAGNOSIS — I87332 Chronic venous hypertension (idiopathic) with ulcer and inflammation of left lower extremity: Secondary | ICD-10-CM | POA: Diagnosis not present

## 2016-07-22 DIAGNOSIS — I87332 Chronic venous hypertension (idiopathic) with ulcer and inflammation of left lower extremity: Secondary | ICD-10-CM | POA: Diagnosis not present

## 2016-07-25 DIAGNOSIS — I87332 Chronic venous hypertension (idiopathic) with ulcer and inflammation of left lower extremity: Secondary | ICD-10-CM | POA: Diagnosis not present

## 2016-08-05 ENCOUNTER — Ambulatory Visit (INDEPENDENT_AMBULATORY_CARE_PROVIDER_SITE_OTHER): Payer: 59 | Admitting: Family Medicine

## 2016-08-05 ENCOUNTER — Encounter: Payer: Self-pay | Admitting: Family Medicine

## 2016-08-05 ENCOUNTER — Encounter (HOSPITAL_BASED_OUTPATIENT_CLINIC_OR_DEPARTMENT_OTHER): Payer: 59 | Attending: Internal Medicine

## 2016-08-05 ENCOUNTER — Other Ambulatory Visit: Payer: Self-pay | Admitting: Family Medicine

## 2016-08-05 VITALS — BP 124/67 | HR 75 | Temp 98.1°F | Resp 22 | Wt 381.8 lb

## 2016-08-05 DIAGNOSIS — I87333 Chronic venous hypertension (idiopathic) 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: Secondary | ICD-10-CM | POA: Insufficient documentation

## 2016-08-05 DIAGNOSIS — E1151 Type 2 diabetes mellitus with diabetic peripheral angiopathy without gangrene: Secondary | ICD-10-CM | POA: Diagnosis not present

## 2016-08-05 DIAGNOSIS — E118 Type 2 diabetes mellitus with unspecified complications: Secondary | ICD-10-CM

## 2016-08-05 DIAGNOSIS — M109 Gout, unspecified: Secondary | ICD-10-CM | POA: Insufficient documentation

## 2016-08-05 DIAGNOSIS — Z794 Long term (current) use of insulin: Secondary | ICD-10-CM

## 2016-08-05 DIAGNOSIS — I872 Venous insufficiency (chronic) (peripheral): Secondary | ICD-10-CM

## 2016-08-05 DIAGNOSIS — Z23 Encounter for immunization: Secondary | ICD-10-CM

## 2016-08-05 DIAGNOSIS — E11622 Type 2 diabetes mellitus with other skin ulcer: Secondary | ICD-10-CM | POA: Insufficient documentation

## 2016-08-05 DIAGNOSIS — I10 Essential (primary) hypertension: Secondary | ICD-10-CM | POA: Insufficient documentation

## 2016-08-05 DIAGNOSIS — Z0000 Encounter for general adult medical examination without abnormal findings: Secondary | ICD-10-CM

## 2016-08-05 DIAGNOSIS — E1140 Type 2 diabetes mellitus with diabetic neuropathy, unspecified: Secondary | ICD-10-CM | POA: Diagnosis not present

## 2016-08-05 DIAGNOSIS — I890 Lymphedema, not elsewhere classified: Secondary | ICD-10-CM | POA: Diagnosis not present

## 2016-08-05 DIAGNOSIS — R609 Edema, unspecified: Secondary | ICD-10-CM

## 2016-08-05 DIAGNOSIS — L03116 Cellulitis of left lower limb: Secondary | ICD-10-CM | POA: Insufficient documentation

## 2016-08-05 DIAGNOSIS — L97821 Non-pressure chronic ulcer of other part of left lower leg limited to breakdown of skin: Secondary | ICD-10-CM | POA: Diagnosis not present

## 2016-08-05 LAB — COMPREHENSIVE METABOLIC PANEL
ALBUMIN: 3.2 g/dL — AB (ref 3.5–5.2)
ALK PHOS: 51 U/L (ref 39–117)
ALT: 31 U/L (ref 0–53)
AST: 19 U/L (ref 0–37)
BUN: 15 mg/dL (ref 6–23)
CO2: 28 mEq/L (ref 19–32)
CREATININE: 1.14 mg/dL (ref 0.40–1.50)
Calcium: 7.8 mg/dL — ABNORMAL LOW (ref 8.4–10.5)
Chloride: 101 mEq/L (ref 96–112)
GFR: 68.45 mL/min (ref >60.00)
Glucose, Bld: 143 mg/dL — ABNORMAL HIGH (ref 70–99)
Potassium: 4.1 mEq/L (ref 3.5–5.1)
SODIUM: 135 meq/L (ref 135–145)
TOTAL PROTEIN: 6.1 g/dL (ref 6.0–8.3)
Total Bilirubin: 1.1 mg/dL (ref 0.2–1.2)

## 2016-08-05 LAB — HEMOGLOBIN A1C: HEMOGLOBIN A1C: 6.7 % — AB (ref 4.6–6.5)

## 2016-08-05 LAB — TSH: TSH: 3.38 u[IU]/mL (ref 0.35–4.50)

## 2016-08-05 MED ORDER — FUROSEMIDE 40 MG PO TABS: 80.0000 mg | ORAL_TABLET | Freq: Every day | ORAL | 0 refills | Status: DC

## 2016-08-05 NOTE — Progress Notes (Signed)
Pre visit review using our clinic review tool, if applicable. No additional management support is needed unless otherwise documented below in the visit note. 

## 2016-08-05 NOTE — Progress Notes (Signed)
OFFICE VISIT  08/05/2016   CC:  Chief Complaint  Patient presents with  . Follow-up    diabetes   HPI:    Patient is a 65 y.o. Caucasian male who presents for 3+ month f/u DM 2, HTN, chronic a-fib, and chronic LE venous insufficiency edema with recent hx of open wound--last visit was still seeing wound care clinic regularly for this.  DM2: fasting was 126 this morning.  Sounds like some possible hyperglycemia from dietary indiscretion. Could not do diabetic foot exam today due to pt having his compression stockings on.  BLood pressure: compliant with meds.  No home bp monitoring.  Denies HA, CP, dizziness, or vision abnormalities. Compliant with xarelto, lopressor, and diltiazem for a-fib.  Denies palpitations or heart racing.  No melena,hematochezia, easy bruising, or epistaxis.  Wound clinic: still seeing them two times per week, says he may be getting on LE device that helps increase hydrostatic pressure soon. Recently developed R thigh cellulitis and has been taking doxycycline he had leftover at home and the wound clinic MD gave him another rx for this today.  Past Medical History:  Diagnosis Date  . ALLERGIC RHINITIS 08/11/2006  . ASTHMA 08/11/2006  . Chronic atrial fibrillation (HCC) 08/2014  . Chronic constipation   . DIABETES MELLITUS, TYPE II 08/11/2006  . Diastolic CHF (HCC)   . DM W/RENAL MANIFESTATIONS, TYPE II 04/20/2007  . HYPERTENSION 08/11/2006  . OBESITY, MORBID 12/14/2007  . Venous insufficiency of both lower extremities     Past Surgical History:  Procedure Laterality Date  . TRANSTHORACIC ECHOCARDIOGRAM  11/2007; 09/2014; 11/2015   LV fxn normal, EF normal, mild dilation of left atrium.  2015 grade II diast dysfxn.  2017 EF 55-60%.  Marland Kitchen. URETERAL STENT PLACEMENT    . virtual colonoscopy  01/2011   Normal    Outpatient Medications Prior to Visit  Medication Sig Dispense Refill  . b complex vitamins capsule Take 1 capsule by mouth every morning.    .  butalbital-acetaminophen-caffeine (FIORICET WITH CODEINE) 50-325-40-30 MG capsule 1-2 caps po q6h prn headache 90 capsule 1  . cetirizine (ZYRTEC) 10 MG tablet Take 10 mg by mouth daily.    . Cinnamon 500 MG TABS Take by mouth. Reported on 02/19/2016    . Coenzyme Q10 (CO Q 10) 10 MG CAPS Take by mouth. Reported on 02/19/2016    . diltiazem (TIAZAC) 360 MG 24 hr capsule TAKE 1 CAPSULE BY MOUTH  DAILY AS DIRECTED  EQUIVALENT TO TIAZAC 90 capsule 2  . HUMALOG MIX 75/25 KWIKPEN (75-25) 100 UNIT/ML Kwikpen Inject subcutaneously 25  units every morning and 20  units every evening at  supper 45 mL 1  . Insulin Pen Needle (BD ULTRA-FINE PEN NEEDLES) 29G X 12.7MM MISC 1 each by Other route 2 (two) times daily. 200 each 2  . Insulin Pen Needle (BD ULTRA-FINE PEN NEEDLES) 29G X 12.7MM MISC USE TWICE DAILY 200 each 3  . lisinopril (PRINIVIL,ZESTRIL) 40 MG tablet Take 1 tablet by mouth  every day 90 tablet 3  . metFORMIN (GLUCOPHAGE) 1000 MG tablet Take 1 tablet by mouth  twice a day with meals 180 tablet 1  . metoprolol (LOPRESSOR) 50 MG tablet Take 1 and 1/2 tablets by  mouth two times daily 270 tablet 3  . Multiple Vitamin (MULTIVITAMIN) tablet Take 1 tablet by mouth daily.    Marland Kitchen. RELION PEN NEEDLES 29G X 12MM MISC USE  TWICE DAILY 200 each 10  . rivaroxaban (XARELTO) 20 MG  TABS tablet Take 1 tablet by mouth  daily with supper 90 tablet 1  . saw palmetto 500 MG capsule Take 500 mg by mouth daily.    Marland Kitchen terazosin (HYTRIN) 10 MG capsule Take 1 capsule by mouth  once a day at bedtime 90 capsule 3  . vitamin E 400 UNIT capsule Take 400 Units by mouth every morning. Selenium 50mg     . furosemide (LASIX) 40 MG tablet Take 1 tablet by mouth  daily 90 tablet 2  . albuterol (PROVENTIL HFA;VENTOLIN HFA) 108 (90 Base) MCG/ACT inhaler Inhale 2 puffs into the lungs every 6 (six) hours as needed for wheezing or shortness of breath. (Patient not taking: Reported on 08/05/2016) 1 Inhaler 1  . nitrofurantoin,  macrocrystal-monohydrate, (MACROBID) 100 MG capsule Take 1 capsule by mouth  every week as needed (Patient not taking: Reported on 08/05/2016) 4 capsule 6  . cephALEXin (KEFLEX) 500 MG capsule Take 500 mg by mouth 2 (two) times daily.     No facility-administered medications prior to visit.     No Known Allergies  ROS As per HPI  PE: Blood pressure 124/67, pulse 75, temperature 98.1 F (36.7 C), temperature source Oral, resp. rate (!) 22, weight (!) 381 lb 12.8 oz (173.2 kg), SpO2 94 %. Gen: Alert, well appearing, morbidly obese WM in NAD.  Patient is oriented to person, place, time, and situation. CV: irreg irreg, no murmur or rub. LUNGS: CTA bilat, nonlabored. EXT: erythema to anteromedial L thigh. Mildly TTP here.  He has 3+ pitting edema bilat extending up into proximal quad   LABS:  Lab Results  Component Value Date   TSH 3.22 06/05/2015   Lab Results  Component Value Date   WBC 7.4 10/24/2015   HGB 11.5 (L) 10/24/2015   HCT 36.0 (L) 10/24/2015   MCV 84.7 10/24/2015   PLT 254 10/24/2015   Lab Results  Component Value Date   CREATININE 1.11 04/22/2016   BUN 18 04/22/2016   NA 141 04/22/2016   K 4.8 04/22/2016   CL 102 04/22/2016   CO2 30 04/22/2016   Lab Results  Component Value Date   ALT 12 06/05/2015   AST 12 06/05/2015   ALKPHOS 54 06/05/2015   BILITOT 0.9 06/05/2015   Lab Results  Component Value Date   CHOL 85 12/11/2015   Lab Results  Component Value Date   HDL 33.70 (L) 12/11/2015   Lab Results  Component Value Date   LDLCALC 41 12/11/2015   Lab Results  Component Value Date   TRIG 48.0 12/11/2015   Lab Results  Component Value Date   CHOLHDL 3 12/11/2015   Lab Results  Component Value Date   PSA 0.21 12/11/2015   PSA 0.37 08/15/2014   Lab Results  Component Value Date   HGBA1C 6.4 04/22/2016    IMPRESSION AND PLAN:  1) DM 2, insulin-requiring. Due for feet exam but due to pt wearing tight compression stockings for his LE  edema we chose to defer this today. HbA1c today.  2) HTN: The current medical regimen is effective;  continue present plan and medications. Lytes/cr today.  3) Chronic a-fib: The current medical regimen is effective;  continue present plan and medications.  4) Chronic LE venous insufficiency edema: he is retaining even more fluid in legs, wt is up 8 lbs since 04/22/16 wt check. He admits to eating a lot of "bad" food in the last week while on vacation in [place]. I think he needs 62  mg lasix daily, so we'll get him back on this dosing. Will recheck BMET in 1 week and if cr stable then will send in 90d rx for lasix 40mg , 2 tabs qd, to his mail order pharmacy.   Continue twice-weekly wound care clinic visits.  5) Left leg cellulitis: continue doxycycline for at least a 10 day course. He seems improved: says he no longer has systemic symptoms.  6) Preventative health care: flu vaccine and prevnar 13 vaccine given today. He'll need a booster (1 dose after turning 65 if 5 years since prior pneumovax) of pneumovax 23 in 1 yr.  An After Visit Summary was printed and given to the patient.  FOLLOW UP: Return for lab visit 1 week (BMET); then fasting CPE 4 mo.  Signed:  Santiago Bumpers, MD           08/05/2016

## 2016-08-08 DIAGNOSIS — I87333 Chronic venous hypertension (idiopathic) 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: Secondary | ICD-10-CM | POA: Diagnosis not present

## 2016-08-12 ENCOUNTER — Other Ambulatory Visit: Payer: 59

## 2016-08-12 ENCOUNTER — Other Ambulatory Visit (INDEPENDENT_AMBULATORY_CARE_PROVIDER_SITE_OTHER): Payer: 59

## 2016-08-12 ENCOUNTER — Ambulatory Visit: Payer: 59 | Admitting: Family Medicine

## 2016-08-12 DIAGNOSIS — I872 Venous insufficiency (chronic) (peripheral): Secondary | ICD-10-CM | POA: Diagnosis not present

## 2016-08-12 DIAGNOSIS — I87333 Chronic venous hypertension (idiopathic) 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: Secondary | ICD-10-CM | POA: Diagnosis not present

## 2016-08-13 LAB — BASIC METABOLIC PANEL
BUN: 14 mg/dL (ref 6–23)
CHLORIDE: 101 meq/L (ref 96–112)
CO2: 31 meq/L (ref 19–32)
CREATININE: 1.19 mg/dL (ref 0.40–1.50)
Calcium: 9.3 mg/dL (ref 8.4–10.5)
GFR: 65.14 mL/min (ref >60.00)
Glucose, Bld: 148 mg/dL — ABNORMAL HIGH (ref 70–99)
Potassium: 4.5 mEq/L (ref 3.5–5.1)
Sodium: 142 mEq/L (ref 135–145)

## 2016-08-15 DIAGNOSIS — I87333 Chronic venous hypertension (idiopathic) 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: Secondary | ICD-10-CM | POA: Diagnosis not present

## 2016-08-18 ENCOUNTER — Other Ambulatory Visit: Payer: Self-pay | Admitting: Family Medicine

## 2016-08-19 ENCOUNTER — Other Ambulatory Visit: Payer: Self-pay

## 2016-08-19 DIAGNOSIS — I87333 Chronic venous hypertension (idiopathic) 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: Secondary | ICD-10-CM | POA: Diagnosis not present

## 2016-08-19 MED ORDER — BUTALBITAL-APAP-CAFF-COD 50-325-40-30 MG PO CAPS: ORAL_CAPSULE | ORAL | 1 refills | Status: DC

## 2016-08-19 NOTE — Telephone Encounter (Signed)
Pharmacy requesting refill. Patient last seen 08/05/16.

## 2016-08-22 ENCOUNTER — Encounter: Payer: Self-pay | Admitting: Family Medicine

## 2016-08-22 DIAGNOSIS — I87333 Chronic venous hypertension (idiopathic) 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: Secondary | ICD-10-CM | POA: Diagnosis not present

## 2016-08-22 NOTE — Telephone Encounter (Signed)
I'll write order on a rx and set this by your keyboard.-thx

## 2016-08-26 DIAGNOSIS — I87333 Chronic venous hypertension (idiopathic) 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: Secondary | ICD-10-CM | POA: Diagnosis not present

## 2016-09-02 DIAGNOSIS — I87333 Chronic venous hypertension (idiopathic) 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: Secondary | ICD-10-CM | POA: Diagnosis not present

## 2016-09-05 ENCOUNTER — Other Ambulatory Visit: Payer: Self-pay

## 2016-09-05 MED ORDER — GLUCOSE BLOOD VI STRP: ORAL_STRIP | 3 refills | Status: DC

## 2016-09-05 NOTE — Telephone Encounter (Signed)
Refill sent.

## 2016-09-10 ENCOUNTER — Encounter (HOSPITAL_BASED_OUTPATIENT_CLINIC_OR_DEPARTMENT_OTHER): Payer: 59 | Attending: Surgery

## 2016-09-10 DIAGNOSIS — L97821 Non-pressure chronic ulcer of other part of left lower leg limited to breakdown of skin: Secondary | ICD-10-CM | POA: Insufficient documentation

## 2016-09-10 DIAGNOSIS — I10 Essential (primary) hypertension: Secondary | ICD-10-CM | POA: Diagnosis not present

## 2016-09-10 DIAGNOSIS — E119 Type 2 diabetes mellitus without complications: Secondary | ICD-10-CM | POA: Diagnosis not present

## 2016-09-10 DIAGNOSIS — L97221 Non-pressure chronic ulcer of left calf limited to breakdown of skin: Secondary | ICD-10-CM | POA: Insufficient documentation

## 2016-09-10 DIAGNOSIS — I890 Lymphedema, not elsewhere classified: Secondary | ICD-10-CM | POA: Insufficient documentation

## 2016-09-10 DIAGNOSIS — I87332 Chronic venous hypertension (idiopathic) with ulcer and inflammation of left lower extremity: Secondary | ICD-10-CM | POA: Diagnosis not present

## 2016-09-10 DIAGNOSIS — L03116 Cellulitis of left lower limb: Secondary | ICD-10-CM | POA: Diagnosis not present

## 2016-09-13 DIAGNOSIS — I87332 Chronic venous hypertension (idiopathic) with ulcer and inflammation of left lower extremity: Secondary | ICD-10-CM | POA: Diagnosis not present

## 2016-09-16 DIAGNOSIS — I87332 Chronic venous hypertension (idiopathic) with ulcer and inflammation of left lower extremity: Secondary | ICD-10-CM | POA: Diagnosis not present

## 2016-09-30 DIAGNOSIS — I87332 Chronic venous hypertension (idiopathic) with ulcer and inflammation of left lower extremity: Secondary | ICD-10-CM | POA: Diagnosis not present

## 2016-10-03 ENCOUNTER — Other Ambulatory Visit: Payer: Self-pay | Admitting: Family Medicine

## 2016-10-04 ENCOUNTER — Encounter (HOSPITAL_BASED_OUTPATIENT_CLINIC_OR_DEPARTMENT_OTHER): Payer: 59 | Attending: Internal Medicine

## 2016-10-04 DIAGNOSIS — E1140 Type 2 diabetes mellitus with diabetic neuropathy, unspecified: Secondary | ICD-10-CM | POA: Diagnosis not present

## 2016-10-04 DIAGNOSIS — L97221 Non-pressure chronic ulcer of left calf limited to breakdown of skin: Secondary | ICD-10-CM | POA: Diagnosis not present

## 2016-10-04 DIAGNOSIS — I890 Lymphedema, not elsewhere classified: Secondary | ICD-10-CM | POA: Insufficient documentation

## 2016-10-04 DIAGNOSIS — M7989 Other specified soft tissue disorders: Secondary | ICD-10-CM | POA: Insufficient documentation

## 2016-10-04 DIAGNOSIS — I87312 Chronic venous hypertension (idiopathic) with ulcer of left lower extremity: Secondary | ICD-10-CM | POA: Diagnosis not present

## 2016-10-04 DIAGNOSIS — I10 Essential (primary) hypertension: Secondary | ICD-10-CM | POA: Insufficient documentation

## 2016-10-04 DIAGNOSIS — E1151 Type 2 diabetes mellitus with diabetic peripheral angiopathy without gangrene: Secondary | ICD-10-CM | POA: Insufficient documentation

## 2016-10-04 DIAGNOSIS — E11622 Type 2 diabetes mellitus with other skin ulcer: Secondary | ICD-10-CM | POA: Diagnosis not present

## 2016-10-04 NOTE — Telephone Encounter (Signed)
RF request for metoprolol LOV: 08/05/16 Next ov: 12/09/16 Last written: 09/14/15 #270 w/ 3Rf

## 2016-10-07 ENCOUNTER — Encounter (HOSPITAL_BASED_OUTPATIENT_CLINIC_OR_DEPARTMENT_OTHER): Payer: 59

## 2016-10-07 DIAGNOSIS — I87312 Chronic venous hypertension (idiopathic) with ulcer of left lower extremity: Secondary | ICD-10-CM | POA: Diagnosis not present

## 2016-10-11 DIAGNOSIS — I87312 Chronic venous hypertension (idiopathic) with ulcer of left lower extremity: Secondary | ICD-10-CM | POA: Diagnosis not present

## 2016-10-14 DIAGNOSIS — I87312 Chronic venous hypertension (idiopathic) with ulcer of left lower extremity: Secondary | ICD-10-CM | POA: Diagnosis not present

## 2016-10-18 DIAGNOSIS — I87312 Chronic venous hypertension (idiopathic) with ulcer of left lower extremity: Secondary | ICD-10-CM | POA: Diagnosis not present

## 2016-10-21 ENCOUNTER — Ambulatory Visit: Payer: 59 | Admitting: Family Medicine

## 2016-10-21 DIAGNOSIS — I87312 Chronic venous hypertension (idiopathic) with ulcer of left lower extremity: Secondary | ICD-10-CM | POA: Diagnosis not present

## 2016-10-21 LAB — GLUCOSE, CAPILLARY: GLUCOSE-CAPILLARY: 152 mg/dL — AB (ref 65–99)

## 2016-10-25 ENCOUNTER — Other Ambulatory Visit: Payer: Self-pay | Admitting: Internal Medicine

## 2016-10-25 DIAGNOSIS — M79605 Pain in left leg: Secondary | ICD-10-CM

## 2016-10-25 DIAGNOSIS — M7989 Other specified soft tissue disorders: Principal | ICD-10-CM

## 2016-10-25 DIAGNOSIS — I87312 Chronic venous hypertension (idiopathic) with ulcer of left lower extremity: Secondary | ICD-10-CM | POA: Diagnosis not present

## 2016-10-29 ENCOUNTER — Ambulatory Visit (HOSPITAL_COMMUNITY)
Admission: RE | Admit: 2016-10-29 | Discharge: 2016-10-29 | Disposition: A | Discharge status: Home / Self Care | Payer: 59 | Source: Ambulatory Visit | Attending: Internal Medicine | Admitting: Internal Medicine

## 2016-10-29 DIAGNOSIS — M7989 Other specified soft tissue disorders: Secondary | ICD-10-CM | POA: Diagnosis not present

## 2016-10-29 DIAGNOSIS — M79605 Pain in left leg: Secondary | ICD-10-CM | POA: Insufficient documentation

## 2016-10-29 NOTE — Addendum Note (Signed)
Addended by: Regan RakersAY, LAURA K on: 10/29/2016 04:42 PM   Modules accepted: Orders

## 2016-10-29 NOTE — Progress Notes (Signed)
**  Preliminary report by tech**  Left lower extremity venous duplex complete. Unable to visualize the left popliteal, posterior tibial, and peroneal veins due to wound bandaging that extends from the knee to the foot. There is no obvious evidence of deep or superficial vein thrombosis involving the left lower extremity. All clearly visualized vessels appear patent and compressible. Results were given to Shatara at [phone].  10/29/16 10:22 AM Olen CordialGreg Clayton Jarmon RVT

## 2016-10-30 ENCOUNTER — Other Ambulatory Visit: Payer: Self-pay | Admitting: Family Medicine

## 2016-11-01 DIAGNOSIS — I87312 Chronic venous hypertension (idiopathic) with ulcer of left lower extremity: Secondary | ICD-10-CM | POA: Diagnosis not present

## 2016-11-06 ENCOUNTER — Encounter: Payer: Self-pay | Admitting: Family Medicine

## 2016-11-06 ENCOUNTER — Other Ambulatory Visit: Payer: Self-pay | Admitting: Family Medicine

## 2016-11-06 MED ORDER — FUROSEMIDE 40 MG PO TABS: 80.0000 mg | ORAL_TABLET | Freq: Every day | ORAL | 1 refills | Status: DC

## 2016-11-06 NOTE — Telephone Encounter (Signed)
RF request for furosemide LOV: 08/05/16 Next ov: 12/09/16 Last written: 08/05/16 #60 w/ 0RF

## 2016-11-08 ENCOUNTER — Encounter (HOSPITAL_BASED_OUTPATIENT_CLINIC_OR_DEPARTMENT_OTHER): Payer: 59 | Attending: Internal Medicine

## 2016-11-08 DIAGNOSIS — I87332 Chronic venous hypertension (idiopathic) with ulcer and inflammation of left lower extremity: Secondary | ICD-10-CM | POA: Diagnosis present

## 2016-11-08 DIAGNOSIS — I10 Essential (primary) hypertension: Secondary | ICD-10-CM | POA: Insufficient documentation

## 2016-11-08 DIAGNOSIS — L97221 Non-pressure chronic ulcer of left calf limited to breakdown of skin: Secondary | ICD-10-CM | POA: Diagnosis not present

## 2016-11-08 DIAGNOSIS — E119 Type 2 diabetes mellitus without complications: Secondary | ICD-10-CM | POA: Diagnosis not present

## 2016-11-08 DIAGNOSIS — I890 Lymphedema, not elsewhere classified: Secondary | ICD-10-CM | POA: Diagnosis not present

## 2016-11-15 DIAGNOSIS — I87332 Chronic venous hypertension (idiopathic) with ulcer and inflammation of left lower extremity: Secondary | ICD-10-CM | POA: Diagnosis not present

## 2016-11-18 DIAGNOSIS — I87332 Chronic venous hypertension (idiopathic) with ulcer and inflammation of left lower extremity: Secondary | ICD-10-CM | POA: Diagnosis not present

## 2016-11-22 DIAGNOSIS — I87332 Chronic venous hypertension (idiopathic) with ulcer and inflammation of left lower extremity: Secondary | ICD-10-CM | POA: Diagnosis not present

## 2016-11-23 ENCOUNTER — Other Ambulatory Visit: Payer: Self-pay | Admitting: Family Medicine

## 2016-11-25 DIAGNOSIS — I87332 Chronic venous hypertension (idiopathic) with ulcer and inflammation of left lower extremity: Secondary | ICD-10-CM | POA: Diagnosis not present

## 2016-11-27 ENCOUNTER — Other Ambulatory Visit: Payer: Self-pay | Admitting: *Deleted

## 2016-11-27 MED ORDER — BUTALBITAL-APAP-CAFF-COD 50-325-40-30 MG PO CAPS: ORAL_CAPSULE | ORAL | 1 refills | Status: DC

## 2016-11-27 NOTE — Telephone Encounter (Signed)
Fax from [facility].  RF request for buta/apap/caf LOV: 08/05/16 Next ov: 12/09/16 Last written: 08/19/16 #90 w/ 1RF  SW Belenda CruiseKristin pharmacist she stated that they are currently filling the last refill on Rx from 08/19/16 and are requesting new Rx for file.   Please advise. Thanks.

## 2016-11-28 DIAGNOSIS — I87332 Chronic venous hypertension (idiopathic) with ulcer and inflammation of left lower extremity: Secondary | ICD-10-CM | POA: Diagnosis not present

## 2016-11-28 NOTE — Telephone Encounter (Signed)
Rx faxed

## 2016-12-02 ENCOUNTER — Other Ambulatory Visit (HOSPITAL_COMMUNITY)
Admission: RE | Admit: 2016-12-02 | Discharge: 2016-12-02 | Disposition: A | Discharge status: Home / Self Care | Payer: 59 | Source: Ambulatory Visit | Attending: Internal Medicine | Admitting: Internal Medicine

## 2016-12-02 DIAGNOSIS — I87332 Chronic venous hypertension (idiopathic) with ulcer and inflammation of left lower extremity: Secondary | ICD-10-CM | POA: Diagnosis not present

## 2016-12-02 DIAGNOSIS — R609 Edema, unspecified: Secondary | ICD-10-CM | POA: Diagnosis present

## 2016-12-02 LAB — BASIC METABOLIC PANEL
Anion gap: 10 (ref 5–15)
BUN: 16 mg/dL (ref 6–20)
CALCIUM: 8.6 mg/dL — AB (ref 8.9–10.3)
CO2: 27 mmol/L (ref 22–32)
CREATININE: 1.31 mg/dL — AB (ref 0.61–1.24)
Chloride: 101 mmol/L (ref 101–111)
GFR calc non Af Amer: 56 mL/min — ABNORMAL LOW (ref >60)
GLUCOSE: 119 mg/dL — AB (ref 65–99)
Potassium: 4.2 mmol/L (ref 3.5–5.1)
Sodium: 138 mmol/L (ref 135–145)

## 2016-12-02 LAB — BRAIN NATRIURETIC PEPTIDE: B Natriuretic Peptide: 317.6 pg/mL — ABNORMAL HIGH (ref 0.0–100.0)

## 2016-12-05 ENCOUNTER — Encounter (HOSPITAL_BASED_OUTPATIENT_CLINIC_OR_DEPARTMENT_OTHER): Payer: 59 | Attending: Internal Medicine

## 2016-12-05 DIAGNOSIS — I890 Lymphedema, not elsewhere classified: Secondary | ICD-10-CM | POA: Insufficient documentation

## 2016-12-05 DIAGNOSIS — I10 Essential (primary) hypertension: Secondary | ICD-10-CM | POA: Diagnosis not present

## 2016-12-05 DIAGNOSIS — L03116 Cellulitis of left lower limb: Secondary | ICD-10-CM | POA: Diagnosis not present

## 2016-12-05 DIAGNOSIS — I87322 Chronic venous hypertension (idiopathic) with inflammation of left lower extremity: Secondary | ICD-10-CM | POA: Insufficient documentation

## 2016-12-05 DIAGNOSIS — E1140 Type 2 diabetes mellitus with diabetic neuropathy, unspecified: Secondary | ICD-10-CM | POA: Insufficient documentation

## 2016-12-05 DIAGNOSIS — L97221 Non-pressure chronic ulcer of left calf limited to breakdown of skin: Secondary | ICD-10-CM | POA: Insufficient documentation

## 2016-12-06 ENCOUNTER — Encounter (HOSPITAL_BASED_OUTPATIENT_CLINIC_OR_DEPARTMENT_OTHER): Payer: 59

## 2016-12-09 ENCOUNTER — Encounter: Payer: Self-pay | Admitting: Family Medicine

## 2016-12-09 ENCOUNTER — Ambulatory Visit (INDEPENDENT_AMBULATORY_CARE_PROVIDER_SITE_OTHER): Payer: 59 | Admitting: Family Medicine

## 2016-12-09 VITALS — BP 164/75 | HR 76 | Temp 97.8°F | Resp 20 | Ht 68.0 in | Wt 380.0 lb

## 2016-12-09 DIAGNOSIS — Z0000 Encounter for general adult medical examination without abnormal findings: Secondary | ICD-10-CM

## 2016-12-09 DIAGNOSIS — L97309 Non-pressure chronic ulcer of unspecified ankle with unspecified severity: Secondary | ICD-10-CM

## 2016-12-09 DIAGNOSIS — Z125 Encounter for screening for malignant neoplasm of prostate: Secondary | ICD-10-CM

## 2016-12-09 DIAGNOSIS — I87322 Chronic venous hypertension (idiopathic) with inflammation of left lower extremity: Secondary | ICD-10-CM | POA: Diagnosis not present

## 2016-12-09 DIAGNOSIS — E1159 Type 2 diabetes mellitus with other circulatory complications: Secondary | ICD-10-CM | POA: Diagnosis not present

## 2016-12-09 DIAGNOSIS — I83003 Varicose veins of unspecified lower extremity with ulcer of ankle: Secondary | ICD-10-CM

## 2016-12-09 LAB — LIPID PANEL
CHOLESTEROL: 94 mg/dL (ref 0–200)
HDL: 33.9 mg/dL — AB (ref >39.00)
LDL CALC: 51 mg/dL (ref 0–99)
NonHDL: 59.97
Total CHOL/HDL Ratio: 3
Triglycerides: 45 mg/dL (ref 0.0–149.0)
VLDL: 9 mg/dL (ref 0.0–40.0)

## 2016-12-09 LAB — CBC WITH DIFFERENTIAL/PLATELET
BASOS ABS: 0.1 10*3/uL (ref 0.0–0.1)
BASOS PCT: 1.2 % (ref 0.0–3.0)
EOS ABS: 0.4 10*3/uL (ref 0.0–0.7)
Eosinophils Relative: 4.7 % (ref 0.0–5.0)
HEMATOCRIT: 38.6 % — AB (ref 39.0–52.0)
HEMOGLOBIN: 12.7 g/dL — AB (ref 13.0–17.0)
LYMPHS PCT: 16.7 % (ref 12.0–46.0)
Lymphs Abs: 1.3 10*3/uL (ref 0.7–4.0)
MCHC: 33 g/dL (ref 30.0–36.0)
MCV: 84.4 fl (ref 78.0–100.0)
MONOS PCT: 8.1 % (ref 3.0–12.0)
Monocytes Absolute: 0.6 10*3/uL (ref 0.1–1.0)
Neutro Abs: 5.2 10*3/uL (ref 1.4–7.7)
Neutrophils Relative %: 69.3 % (ref 43.0–77.0)
Platelets: 263 10*3/uL (ref 150.0–400.0)
RBC: 4.57 Mil/uL (ref 4.22–5.81)
RDW: 16.6 % — AB (ref 11.5–15.5)
WBC: 7.5 10*3/uL (ref 4.0–10.5)

## 2016-12-09 LAB — COMPREHENSIVE METABOLIC PANEL
ALBUMIN: 3.7 g/dL (ref 3.5–5.2)
ALK PHOS: 72 U/L (ref 39–117)
ALT: 14 U/L (ref 0–53)
AST: 15 U/L (ref 0–37)
BUN: 13 mg/dL (ref 6–23)
CO2: 30 mEq/L (ref 19–32)
Calcium: 8.7 mg/dL (ref 8.4–10.5)
Chloride: 101 mEq/L (ref 96–112)
Creatinine, Ser: 1.09 mg/dL (ref 0.40–1.50)
GFR: 72.01 mL/min (ref >60.00)
Glucose, Bld: 118 mg/dL — ABNORMAL HIGH (ref 70–99)
POTASSIUM: 4.1 meq/L (ref 3.5–5.1)
Sodium: 139 mEq/L (ref 135–145)
TOTAL PROTEIN: 6.9 g/dL (ref 6.0–8.3)
Total Bilirubin: 1.1 mg/dL (ref 0.2–1.2)

## 2016-12-09 LAB — HEMOGLOBIN A1C: Hgb A1c MFr Bld: 6.6 % — ABNORMAL HIGH (ref 4.6–6.5)

## 2016-12-09 LAB — TSH: TSH: 2.99 u[IU]/mL (ref 0.35–4.50)

## 2016-12-09 LAB — PSA, MEDICARE: PSA: 0.3 ng/mL (ref 0.10–4.00)

## 2016-12-09 NOTE — Progress Notes (Signed)
Office Note 12/09/2016  CC:  Chief Complaint  Patient presents with  . Annual Exam    fasting    HPI:  [name] is a 66 y.o. White male who is here for annual health maintenance exam. His left ankle wound is draining more and possibly infected so he's back on doxycycline.  He has wound clinic f/u later today. He is trying to eat a low Na diet.  He is compliant with 80mg  lasix per day. Eye exam: has one set for 12/23/16. Dental: he has dentures.    Past Medical History:  Diagnosis Date  . ALLERGIC RHINITIS 08/11/2006  . ASTHMA 08/11/2006  . Chronic atrial fibrillation (HCC) 08/2014  . Chronic constipation   . Chronic renal insufficiency, stage 2 (mild) 2017   GFR low 60s  . DIABETES MELLITUS, TYPE II 08/11/2006  . Diastolic CHF (HCC)   . DM W/RENAL MANIFESTATIONS, TYPE II 04/20/2007  . HYPERTENSION 08/11/2006  . OBESITY, MORBID 12/14/2007  . Venous insufficiency of both lower extremities     Past Surgical History:  Procedure Laterality Date  . TRANSTHORACIC ECHOCARDIOGRAM  11/2007; 09/2014; 11/2015   LV fxn normal, EF normal, mild dilation of left atrium.  2015 grade II diast dysfxn.  2017 EF 55-60%.  Marland Kitchen URETERAL STENT PLACEMENT    . virtual colonoscopy  01/2011   Normal    Family History  Problem Relation Age of Onset  . Diabetes Mother   . Diabetes Sister   . Hydrocephalus Sister     NPH    Social History   Social History  . Marital status: Single    Spouse name: N/A  . Number of children: N/A  . Years of education: N/A   Occupational History  . Not on file.   Social History Main Topics  . Smoking status: Never Smoker  . Smokeless tobacco: Never Used  . Alcohol use No  . Drug use: No  . Sexual activity: Not on file   Other Topics Concern  . Not on file   Social History Narrative   Single, no children.   Lives in [place] since 1985 [place]).   Occupation: Science writer for [employer].   No Tobacco, no alc, drugs.   Exercise: no    Diet: no          Outpatient Medications Prior to Visit  Medication Sig Dispense Refill  . albuterol (PROVENTIL HFA;VENTOLIN HFA) 108 (90 Base) MCG/ACT inhaler Inhale 2 puffs into the lungs every 6 (six) hours as needed for wheezing or shortness of breath. 1 Inhaler 1  . b complex vitamins capsule Take 1 capsule by mouth every morning.    . butalbital-acetaminophen-caffeine (FIORICET WITH CODEINE) 50-325-40-30 MG capsule 1-2 caps po q6h prn headache 90 capsule 1  . cetirizine (ZYRTEC) 10 MG tablet Take 10 mg by mouth daily.    . Cinnamon 500 MG TABS Take by mouth. Reported on 02/19/2016    . Coenzyme Q10 (CO Q 10) 10 MG CAPS Take by mouth. Reported on 02/19/2016    . diltiazem (TIAZAC) 360 MG 24 hr capsule TAKE 1 CAPSULE BY MOUTH  DAILY AS DIRECTED  EQUIVALENT TO TIAZAC 90 capsule 0  . doxycycline (VIBRAMYCIN) 100 MG capsule Take 100 mg by mouth 2 (two) times daily.    . furosemide (LASIX) 40 MG tablet Take 2 tablets (80 mg total) by mouth daily. 180 tablet 1  . glucose blood test strip Test blood sugar twice daily 200 each 3  .  HUMALOG MIX 75/25 KWIKPEN (75-25) 100 UNIT/ML Kwikpen INJECT SUBCUTANEOUSLY 25  UNITS EVERY MORNING AND 20  UNITS EVERY EVENING AT  SUPPER 45 mL 2  . lisinopril (PRINIVIL,ZESTRIL) 40 MG tablet Take 1 tablet by mouth  every day 90 tablet 3  . metFORMIN (GLUCOPHAGE) 1000 MG tablet TAKE 1 TABLET BY MOUTH  TWICE A DAY WITH MEALS 180 tablet 3  . metoprolol (LOPRESSOR) 50 MG tablet TAKE 1 AND 1/2 TABLETS BY  MOUTH TWO TIMES DAILY 270 tablet 3  . Multiple Vitamin (MULTIVITAMIN) tablet Take 1 tablet by mouth daily.    . saw palmetto 500 MG capsule Take 500 mg by mouth daily.    Marland Kitchen. terazosin (HYTRIN) 10 MG capsule Take 1 capsule by mouth  once a day at bedtime 90 capsule 3  . vitamin E 400 UNIT capsule Take 400 Units by mouth every morning. Selenium 50mg     . XARELTO 20 MG TABS tablet TAKE ONE TABLET BY MOUTH ONCE DAILY WITH  SUPPER 90 tablet 1  . Insulin Pen  Needle (BD ULTRA-FINE PEN NEEDLES) 29G X 12.7MM MISC 1 each by Other route 2 (two) times daily. 200 each 2  . Insulin Pen Needle (BD ULTRA-FINE PEN NEEDLES) 29G X 12.7MM MISC USE TWICE DAILY 200 each 3  . RELION PEN NEEDLES 29G X 12MM MISC USE  TWICE DAILY 200 each 10  . nitrofurantoin, macrocrystal-monohydrate, (MACROBID) 100 MG capsule Take 1 capsule by mouth  every week as needed (Patient not taking: Reported on 08/05/2016) 4 capsule 6   No facility-administered medications prior to visit.     No Known Allergies  ROS Review of Systems  Constitutional: Negative for appetite change, chills, fatigue and fever.  HENT: Negative for congestion, dental problem, ear pain and sore throat.   Eyes: Negative for discharge, redness and visual disturbance.  Respiratory: Negative for cough, chest tightness, shortness of breath and wheezing.   Cardiovascular: Negative for chest pain, palpitations and leg swelling.  Gastrointestinal: Negative for abdominal pain, blood in stool, diarrhea, nausea and vomiting.  Genitourinary: Negative for difficulty urinating, dysuria, flank pain, frequency, hematuria and urgency.  Musculoskeletal: Negative for arthralgias, back pain, joint swelling, myalgias and neck stiffness.       Left ankle wound discomfort  Skin: Negative for pallor and rash.  Neurological: Negative for dizziness, speech difficulty, weakness and headaches.  Hematological: Negative for adenopathy. Does not bruise/bleed easily.  Psychiatric/Behavioral: Negative for confusion and sleep disturbance. The patient is not nervous/anxious.     PE; Blood pressure (!) 164/75, pulse 76, temperature 97.8 F (36.6 C), temperature source Temporal, resp. rate 20, height 5\' 8"  (1.727 m), weight (!) 380 lb (172.4 kg), SpO2 96 %. Body mass index is 57.78 kg/m.  Gen: Alert, well appearing.  Patient is oriented to person, place, time, and situation. AFFECT: pleasant, lucid thought and speech. ENT: Ears: EACs  clear, normal epithelium.  TMs with good light reflex and landmarks bilaterally.  Eyes: no injection, icteris, swelling, or exudate.  EOMI, PERRLA. Nose: no drainage or turbinate edema/swelling.  No injection or focal lesion.  Mouth: lips without lesion/swelling.  Oral mucosa pink and moist.  Dentition intact and without obvious caries or gingival swelling.  Oropharynx without erythema, exudate, or swelling.  Neck: supple/nontender.  No LAD, mass, or TM.  Carotid pulses 2+ bilaterally, without bruits. CV: Irreg irreg, rate approx 70s, no m/r/g.   LUNGS: CTA bilat, nonlabored resps, good aeration in all lung fields. ABD: markedly rotund abdomen is soft, NT,  ND, BS normal.  No hepatospenomegaly or mass.  No bruits. EXT: no clubbing or cyanosis.  He has 3+ pitting edema in R LL.  L LL had cast on it and leakage was noted onto cast. Musculoskeletal: no joint swelling, erythema, warmth, or tenderness.  ROM of all joints intact. Skin - no sores or suspicious lesions or rashes or color changes Rectal exam: negative without mass, lesions or tenderness, PROSTATE EXAM: smooth and symmetric without nodules or tenderness.   Pertinent labs:  Lab Results  Component Value Date   TSH 3.38 08/05/2016   Lab Results  Component Value Date   WBC 7.4 10/24/2015   HGB 11.5 (L) 10/24/2015   HCT 36.0 (L) 10/24/2015   MCV 84.7 10/24/2015   PLT 254 10/24/2015   Lab Results  Component Value Date   CREATININE 1.31 (H) 12/02/2016   BUN 16 12/02/2016   NA 138 12/02/2016   K 4.2 12/02/2016   CL 101 12/02/2016   CO2 27 12/02/2016   Lab Results  Component Value Date   ALT 31 08/05/2016   AST 19 08/05/2016   ALKPHOS 51 08/05/2016   BILITOT 1.1 08/05/2016   Lab Results  Component Value Date   CHOL 85 12/11/2015   Lab Results  Component Value Date   HDL 33.70 (L) 12/11/2015   Lab Results  Component Value Date   LDLCALC 41 12/11/2015   Lab Results  Component Value Date   TRIG 48.0 12/11/2015   Lab  Results  Component Value Date   CHOLHDL 3 12/11/2015   Lab Results  Component Value Date   PSA 0.21 12/11/2015   PSA 0.37 08/15/2014   Lab Results  Component Value Date   HGBA1C 6.7 (H) 08/05/2016    ASSESSMENT AND PLAN:   Health maintenance exam: Reviewed age and gender appropriate health maintenance issues (prudent diet, regular exercise, health risks of tobacco and excessive alcohol, use of seatbelts, fire alarms in home, use of sunscreen).  Also reviewed age and gender appropriate health screening as well as vaccine recommendations. Prostate ca screening: DRE normal today, PSA drawn. Colon cancer screening: virtual colonoscopy 01/2011 normal.  Next colonoscopy 01/2021. Fasting HP labs + HbA1c (DM 2) drawn today.  Unable to do diabetic foot exam today due to compression hose and cast on. Continue f/u with wound clinic for chronic L ankle venous stasis ulcer.  An After Visit Summary was printed and given to the patient.  FOLLOW UP:  Return in about 3 months (around 03/08/2017) for routine chronic illness f/u.  Signed:  Santiago Bumpers, MD           12/09/2016

## 2016-12-09 NOTE — Progress Notes (Signed)
Pre visit review using our clinic review tool, if applicable. No additional management support is needed unless otherwise documented below in the visit note. 

## 2016-12-12 DIAGNOSIS — I87322 Chronic venous hypertension (idiopathic) with inflammation of left lower extremity: Secondary | ICD-10-CM | POA: Diagnosis not present

## 2016-12-16 DIAGNOSIS — I87322 Chronic venous hypertension (idiopathic) with inflammation of left lower extremity: Secondary | ICD-10-CM | POA: Diagnosis not present

## 2016-12-19 DIAGNOSIS — I87322 Chronic venous hypertension (idiopathic) with inflammation of left lower extremity: Secondary | ICD-10-CM | POA: Diagnosis not present

## 2016-12-23 ENCOUNTER — Ambulatory Visit (INDEPENDENT_AMBULATORY_CARE_PROVIDER_SITE_OTHER): Payer: 59 | Admitting: Internal Medicine

## 2016-12-23 ENCOUNTER — Encounter: Payer: Self-pay | Admitting: Internal Medicine

## 2016-12-23 DIAGNOSIS — I10 Essential (primary) hypertension: Secondary | ICD-10-CM | POA: Diagnosis not present

## 2016-12-23 DIAGNOSIS — I5032 Chronic diastolic (congestive) heart failure: Secondary | ICD-10-CM

## 2016-12-23 DIAGNOSIS — I481 Persistent atrial fibrillation: Secondary | ICD-10-CM | POA: Diagnosis not present

## 2016-12-23 DIAGNOSIS — I4819 Other persistent atrial fibrillation: Secondary | ICD-10-CM

## 2016-12-23 DIAGNOSIS — I87322 Chronic venous hypertension (idiopathic) with inflammation of left lower extremity: Secondary | ICD-10-CM | POA: Diagnosis not present

## 2016-12-23 NOTE — Patient Instructions (Signed)
Nasonex or Flonase over the counter with nasal saline STOP Afrin  Your physician wants you to follow-up in: ONE YEAR with Dr. Rennis GoldenHilty. You will receive a reminder letter in the mail two months in advance. If you don't receive a letter, please call our office to schedule the follow-up appointment.

## 2016-12-24 ENCOUNTER — Encounter: Payer: Self-pay | Admitting: Family Medicine

## 2016-12-24 DIAGNOSIS — I5032 Chronic diastolic (congestive) heart failure: Secondary | ICD-10-CM | POA: Insufficient documentation

## 2016-12-24 NOTE — Progress Notes (Signed)
OFFICE NOTE  Chief Complaint:  No complaints  Primary Care Physician: [name], MD  HPI:  [name] is a 66 year old male who is currently referred by Dr. Milinda Cave for new onset atrial flutter/fibrillation. [name] was at his office for a routine physical and underwent an EKG. This demonstrated either coarse A. Fib or atrial flutter with variable ventricular response and a rate around 80. It was noted that he was already on metoprolol tartrate 75 mg twice daily for hypertension. 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  He is reportedly unaware of his atrial fibrillation. Fortunately an EKG in the office today shows that he is back in a sinus rhythm or sinus arrhythmia. There is no family history of coronary disease or A. Fib. He denies any chest pain or worsening shortness of breath, but has been having problems with sciatic pain in his back and difficulty ambulating. He reports he gets good sleep at night, rarely wakes up, does not have morning headaches or the need for daytime napping. His screening EPWSS was only 3, suggesting that obstructive sleep apnea is actually fairly unlikely, despite his significant weight.  [name] returns today for follow-up. He underwent a nuclear stress test which was a 2 day study. This demonstrated significant TID 1.48, however it is unclear how reliable this could be because of the fact the study was done on 2 different days. The test was negative for ischemia. EF was calculated at 42%, which is lower than his EF was thought to be at 50%. He denies any chest pain, ever. He also has stable shortness of breath but no worsening symptoms. He reportedly was unaware of A. fib and still has not had any symptoms. He is currently on Xarelto and is not having any bleeding problems.  [name] returns today for follow-up. He reports some  recent worsening of shortness of breath on exertion. He saw Dr. Milinda Cave for this and had some increase in his Lasix. Up to 80 mg daily. He is currently taking 40 mg daily does not notice significant difference in his symptoms. He had a recent echocardiogram which shows an improvement in LV function up to 55-60%. He feels some of this is due to excess weight. He's been less active. He is now close to 400 pounds. Has pain. He has recurrent atrial fibrillation and is in A. fib today to rate is 78. This is been paroxysmal in the past however may be more persistent. He is not aware of his A. fib. He is on Xarelto and is been compliant with that medication. He denies any bleeding problems.   12/24/2016  [name] returns to for follow-up. Initially blood pressure was elevated 166/60 became and 132/64. He has managed to lose about 15 pounds which I commended him on. He denies any chest pain or worsening shortness of breath. He remains in atrial fibrillation with controlled ventricular response of 61. He denies any bleeding problems on Xarelto.  PMHx:  Past Medical History:  Diagnosis Date  . ALLERGIC RHINITIS 08/11/2006  . ASTHMA 08/11/2006  . Chronic atrial fibrillation (HCC) 08/2014  . Chronic constipation   . Chronic renal insufficiency, stage 2 (mild) 2017   GFR low 60s  . DIABETES MELLITUS, TYPE II 08/11/2006  . Diastolic CHF (HCC)   . DM W/RENAL MANIFESTATIONS, TYPE II 04/20/2007  . HYPERTENSION 08/11/2006  . OBESITY, MORBID 12/14/2007  .  Venous insufficiency of both lower extremities     Past Surgical History:  Procedure Laterality Date  . TRANSTHORACIC ECHOCARDIOGRAM  11/2007; 09/2014; 11/2015   LV fxn normal, EF normal, mild dilation of left atrium.  2015 grade II diast dysfxn.  2017 EF 55-60%.  Marland Kitchen URETERAL STENT PLACEMENT    . virtual colonoscopy  01/2011   Normal    FAMHx:  Family History  Problem Relation Age of Onset  . Diabetes Mother   . Diabetes Sister   . Hydrocephalus Sister      NPH    SOCHx:   reports that he has never smoked. He has never used smokeless tobacco. He reports that he does not drink alcohol or use drugs.  ALLERGIES:  No Known Allergies  ROS: Pertinent items noted in HPI and remainder of comprehensive ROS otherwise negative.  HOME MEDS: Current Outpatient Prescriptions  Medication Sig Dispense Refill  . albuterol (PROVENTIL HFA;VENTOLIN HFA) 108 (90 Base) MCG/ACT inhaler Inhale 2 puffs into the lungs every 6 (six) hours as needed for wheezing or shortness of breath. 1 Inhaler 1  . b complex vitamins capsule Take 1 capsule by mouth every morning.    . butalbital-acetaminophen-caffeine (FIORICET WITH CODEINE) 50-325-40-30 MG capsule 1-2 caps po q6h prn headache 90 capsule 1  . cetirizine (ZYRTEC) 10 MG tablet Take 10 mg by mouth daily.    . Cinnamon 500 MG TABS Take by mouth. Reported on 02/19/2016    . Coenzyme Q10 (CO Q 10) 10 MG CAPS Take by mouth. Reported on 02/19/2016    . diltiazem (TIAZAC) 360 MG 24 hr capsule TAKE 1 CAPSULE BY MOUTH  DAILY AS DIRECTED  EQUIVALENT TO TIAZAC 90 capsule 0  . furosemide (LASIX) 40 MG tablet Take 2 tablets (80 mg total) by mouth daily. 180 tablet 1  . glucose blood test strip Test blood sugar twice daily 200 each 3  . HUMALOG MIX 75/25 KWIKPEN (75-25) 100 UNIT/ML Kwikpen INJECT SUBCUTANEOUSLY 25  UNITS EVERY MORNING AND 20  UNITS EVERY EVENING AT  SUPPER 45 mL 2  . Insulin Pen Needle (BD ULTRA-FINE PEN NEEDLES) 29G X 12.7MM MISC 1 each by Other route 2 (two) times daily. 200 each 2  . Insulin Pen Needle (BD ULTRA-FINE PEN NEEDLES) 29G X 12.7MM MISC USE TWICE DAILY 200 each 3  . lisinopril (PRINIVIL,ZESTRIL) 40 MG tablet Take 1 tablet by mouth  every day 90 tablet 3  . metFORMIN (GLUCOPHAGE) 1000 MG tablet TAKE 1 TABLET BY MOUTH  TWICE A DAY WITH MEALS 180 tablet 3  . metoprolol (LOPRESSOR) 50 MG tablet TAKE 1 AND 1/2 TABLETS BY  MOUTH TWO TIMES DAILY 270 tablet 3  . Multiple Vitamin (MULTIVITAMIN) tablet Take 1  tablet by mouth daily.    Marland Kitchen RELION PEN NEEDLES 29G X MISC USE  TWICE DAILY 200 each 10  . saw palmetto 500 MG capsule Take 500 mg by mouth daily.    Marland Kitchen terazosin (HYTRIN) 10 MG capsule Take 1 capsule by mouth  once a day at bedtime 90 capsule 3  . vitamin E 400 UNIT capsule Take 400 Units by mouth every morning. Selenium 50mg     . XARELTO 20 MG TABS tablet TAKE ONE TABLET BY MOUTH ONCE DAILY WITH  SUPPER 90 tablet 1   No current facility-administered medications for this visit.     LABS/IMAGING: No results found for this or any previous visit (from the past 48 hour(s)). No results found.  VITALS: BP (!) 166/60  Pulse 61   Ht 5\' 8"  (1.727 m)   Wt (!) 379 lb (171.9 kg)   BMI 57.63 kg/m   EXAM: General appearance: alert, no distress and morbidly obese Neck: no carotid bruit and no JVD Lungs: clear to auscultation bilaterally Heart: irregularly irregular rhythm Abdomen: soft, non-tender; bowel sounds normal; no masses,  no organomegaly Extremities: edema 1+ bilateral Pulses: 2+ and symmetric Skin: Skin color, texture, turgor normal. No rashes or lesions Neurologic: Grossly normal Psych: Pleasant  EKG: Atrial fibrillation at 61  ASSESSMENT: 1. PAF-CHADSVASC score of 3 2. Hypertension 3. Morbid obesity 4. 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 Dyslipidemia 6. Combined chronic systolic and diastolic heart failure (LVEF 55-60% in 11/2015)  PLAN: 1.   [name] is in rate-controlled atrial fibrillation which is persistent. He is on Xarelto and has not missed any doses. I commended him on his weight loss. Recently had upper respiratory infection and has nasal congestion. He's been using Afrin for several weeks and is still congested. I counseled him on the fact that that can become addictive and is indicated only for 3 days of use. I advised him to use Nasonex alternating with nasal saline to help him take his aspirin allergy. From a cardiac standpoint  of encouraged continued weight loss otherwise I did not change any medicines today. Follow-up annually or sooner as necessary.  Chrystie NoseKenneth C. Glenard Keesling, MD, [facility] Attending Cardiologist [facility]  Chrystie NoseKenneth C Nikki Glanzer 12/24/2016, 6:14 PM

## 2016-12-26 DIAGNOSIS — I87322 Chronic venous hypertension (idiopathic) with inflammation of left lower extremity: Secondary | ICD-10-CM | POA: Diagnosis not present

## 2016-12-30 DIAGNOSIS — I87322 Chronic venous hypertension (idiopathic) with inflammation of left lower extremity: Secondary | ICD-10-CM | POA: Diagnosis not present

## 2017-01-02 ENCOUNTER — Encounter (HOSPITAL_BASED_OUTPATIENT_CLINIC_OR_DEPARTMENT_OTHER): Payer: 59 | Attending: Internal Medicine

## 2017-01-02 DIAGNOSIS — I87332 Chronic venous hypertension (idiopathic) with ulcer and inflammation of left lower extremity: Secondary | ICD-10-CM | POA: Diagnosis present

## 2017-01-02 DIAGNOSIS — L97222 Non-pressure chronic ulcer of left calf with fat layer exposed: Secondary | ICD-10-CM | POA: Insufficient documentation

## 2017-01-02 DIAGNOSIS — E119 Type 2 diabetes mellitus without complications: Secondary | ICD-10-CM | POA: Diagnosis not present

## 2017-01-02 DIAGNOSIS — I890 Lymphedema, not elsewhere classified: Secondary | ICD-10-CM | POA: Insufficient documentation

## 2017-01-02 DIAGNOSIS — I10 Essential (primary) hypertension: Secondary | ICD-10-CM | POA: Diagnosis not present

## 2017-01-07 DIAGNOSIS — I87332 Chronic venous hypertension (idiopathic) with ulcer and inflammation of left lower extremity: Secondary | ICD-10-CM | POA: Diagnosis not present

## 2017-01-10 DIAGNOSIS — I87332 Chronic venous hypertension (idiopathic) with ulcer and inflammation of left lower extremity: Secondary | ICD-10-CM | POA: Diagnosis not present

## 2017-01-14 DIAGNOSIS — I87332 Chronic venous hypertension (idiopathic) with ulcer and inflammation of left lower extremity: Secondary | ICD-10-CM | POA: Diagnosis not present

## 2017-01-16 ENCOUNTER — Other Ambulatory Visit: Payer: Self-pay | Admitting: Family Medicine

## 2017-01-16 DIAGNOSIS — I87332 Chronic venous hypertension (idiopathic) with ulcer and inflammation of left lower extremity: Secondary | ICD-10-CM | POA: Diagnosis not present

## 2017-01-20 ENCOUNTER — Other Ambulatory Visit: Payer: Self-pay | Admitting: *Deleted

## 2017-01-20 DIAGNOSIS — I87332 Chronic venous hypertension (idiopathic) with ulcer and inflammation of left lower extremity: Secondary | ICD-10-CM | POA: Diagnosis not present

## 2017-01-20 MED ORDER — DILTIAZEM HCL ER BEADS 360 MG PO CP24: ORAL_CAPSULE | ORAL | 1 refills | Status: DC

## 2017-01-20 NOTE — Telephone Encounter (Signed)
[facility].  RF request for taztia xt LOV: 12/09/16 Next ov: 03/17/17 Last written: 11/25/16 #90 w/ 0RF

## 2017-01-24 DIAGNOSIS — I87332 Chronic venous hypertension (idiopathic) with ulcer and inflammation of left lower extremity: Secondary | ICD-10-CM | POA: Diagnosis not present

## 2017-01-27 ENCOUNTER — Encounter: Payer: Self-pay | Admitting: Family Medicine

## 2017-01-27 DIAGNOSIS — I87332 Chronic venous hypertension (idiopathic) with ulcer and inflammation of left lower extremity: Secondary | ICD-10-CM | POA: Diagnosis not present

## 2017-01-27 LAB — HM DIABETES EYE EXAM

## 2017-01-31 DIAGNOSIS — I87332 Chronic venous hypertension (idiopathic) with ulcer and inflammation of left lower extremity: Secondary | ICD-10-CM | POA: Diagnosis not present

## 2017-02-03 ENCOUNTER — Encounter: Payer: Self-pay | Admitting: Family Medicine

## 2017-02-03 ENCOUNTER — Encounter (HOSPITAL_BASED_OUTPATIENT_CLINIC_OR_DEPARTMENT_OTHER): Payer: 59 | Attending: Internal Medicine

## 2017-02-03 DIAGNOSIS — E1140 Type 2 diabetes mellitus with diabetic neuropathy, unspecified: Secondary | ICD-10-CM | POA: Diagnosis not present

## 2017-02-03 DIAGNOSIS — B9562 Methicillin resistant Staphylococcus aureus infection as the cause of diseases classified elsewhere: Secondary | ICD-10-CM | POA: Insufficient documentation

## 2017-02-03 DIAGNOSIS — L97521 Non-pressure chronic ulcer of other 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 of skin: Secondary | ICD-10-CM | POA: Diagnosis not present

## 2017-02-03 DIAGNOSIS — I87333 Chronic venous hypertension (idiopathic) 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: Secondary | ICD-10-CM | POA: Insufficient documentation

## 2017-02-03 DIAGNOSIS — I890 Lymphedema, not elsewhere classified: Secondary | ICD-10-CM | POA: Insufficient documentation

## 2017-02-03 DIAGNOSIS — L97221 Non-pressure chronic ulcer of left calf limited to breakdown of skin: Secondary | ICD-10-CM | POA: Insufficient documentation

## 2017-02-03 DIAGNOSIS — E11622 Type 2 diabetes mellitus with other skin ulcer: Secondary | ICD-10-CM | POA: Insufficient documentation

## 2017-02-03 NOTE — Telephone Encounter (Signed)
Please advise. Thanks.  

## 2017-02-03 NOTE — Telephone Encounter (Signed)
Actually, since the underlying condition causing his skin breakdown is chronic venous insufficiency, the better specialist to see would be vascular surgery.  They would likely give recommendations regarding skin care in this condition.  If pt is interested, let me know and I'll make referral. If he is still insistent on dermatologist referral, then I will go ahead and order this as per his preference.--thx

## 2017-02-07 ENCOUNTER — Encounter (HOSPITAL_BASED_OUTPATIENT_CLINIC_OR_DEPARTMENT_OTHER): Payer: 59

## 2017-02-07 ENCOUNTER — Other Ambulatory Visit (HOSPITAL_COMMUNITY)
Admit: 2017-02-07 | Discharge: 2017-02-07 | Disposition: A | Discharge status: Home / Self Care | Payer: 59 | Source: Ambulatory Visit | Attending: Internal Medicine | Admitting: Internal Medicine

## 2017-02-07 DIAGNOSIS — E11622 Type 2 diabetes mellitus with other skin ulcer: Secondary | ICD-10-CM | POA: Insufficient documentation

## 2017-02-07 DIAGNOSIS — I87333 Chronic venous hypertension (idiopathic) 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: Secondary | ICD-10-CM | POA: Diagnosis not present

## 2017-02-07 DIAGNOSIS — L97221 Non-pressure chronic ulcer of left calf limited to breakdown of skin: Secondary | ICD-10-CM | POA: Diagnosis present

## 2017-02-10 LAB — AEROBIC CULTURE  (SUPERFICIAL SPECIMEN)

## 2017-02-10 LAB — AEROBIC CULTURE W GRAM STAIN (SUPERFICIAL SPECIMEN)

## 2017-02-11 DIAGNOSIS — I87333 Chronic venous hypertension (idiopathic) 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: Secondary | ICD-10-CM | POA: Diagnosis not present

## 2017-02-14 DIAGNOSIS — I87333 Chronic venous hypertension (idiopathic) 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: Secondary | ICD-10-CM | POA: Diagnosis not present

## 2017-02-17 DIAGNOSIS — I87333 Chronic venous hypertension (idiopathic) 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: Secondary | ICD-10-CM | POA: Diagnosis not present

## 2017-02-24 DIAGNOSIS — I87333 Chronic venous hypertension (idiopathic) 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: Secondary | ICD-10-CM | POA: Diagnosis not present

## 2017-02-28 DIAGNOSIS — I87333 Chronic venous hypertension (idiopathic) 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: Secondary | ICD-10-CM | POA: Diagnosis not present

## 2017-03-03 DIAGNOSIS — I87333 Chronic venous hypertension (idiopathic) 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: Secondary | ICD-10-CM | POA: Diagnosis not present

## 2017-03-07 ENCOUNTER — Encounter (HOSPITAL_BASED_OUTPATIENT_CLINIC_OR_DEPARTMENT_OTHER): Payer: 59 | Attending: Internal Medicine

## 2017-03-07 DIAGNOSIS — I87333 Chronic venous hypertension (idiopathic) 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: Secondary | ICD-10-CM | POA: Insufficient documentation

## 2017-03-07 DIAGNOSIS — E11622 Type 2 diabetes mellitus with other skin ulcer: Secondary | ICD-10-CM | POA: Insufficient documentation

## 2017-03-07 DIAGNOSIS — E1140 Type 2 diabetes mellitus with diabetic neuropathy, unspecified: Secondary | ICD-10-CM | POA: Insufficient documentation

## 2017-03-07 DIAGNOSIS — F411 Generalized anxiety disorder: Secondary | ICD-10-CM | POA: Insufficient documentation

## 2017-03-07 DIAGNOSIS — M109 Gout, unspecified: Secondary | ICD-10-CM | POA: Insufficient documentation

## 2017-03-07 DIAGNOSIS — L97221 Non-pressure chronic ulcer of left calf limited to breakdown of skin: Secondary | ICD-10-CM | POA: Insufficient documentation

## 2017-03-07 DIAGNOSIS — I10 Essential (primary) hypertension: Secondary | ICD-10-CM | POA: Insufficient documentation

## 2017-03-07 DIAGNOSIS — I890 Lymphedema, not elsewhere classified: Secondary | ICD-10-CM | POA: Insufficient documentation

## 2017-03-10 DIAGNOSIS — I10 Essential (primary) hypertension: Secondary | ICD-10-CM | POA: Diagnosis not present

## 2017-03-10 DIAGNOSIS — E1140 Type 2 diabetes mellitus with diabetic neuropathy, unspecified: Secondary | ICD-10-CM | POA: Diagnosis not present

## 2017-03-10 DIAGNOSIS — M109 Gout, unspecified: Secondary | ICD-10-CM | POA: Diagnosis not present

## 2017-03-10 DIAGNOSIS — F411 Generalized anxiety disorder: Secondary | ICD-10-CM | POA: Diagnosis not present

## 2017-03-10 DIAGNOSIS — I890 Lymphedema, not elsewhere classified: Secondary | ICD-10-CM | POA: Diagnosis not present

## 2017-03-10 DIAGNOSIS — L97221 Non-pressure chronic ulcer of left calf limited to breakdown of skin: Secondary | ICD-10-CM | POA: Diagnosis not present

## 2017-03-10 DIAGNOSIS — I87333 Chronic venous hypertension (idiopathic) 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: Secondary | ICD-10-CM | POA: Diagnosis not present

## 2017-03-10 DIAGNOSIS — E11622 Type 2 diabetes mellitus with other skin ulcer: Secondary | ICD-10-CM | POA: Diagnosis present

## 2017-03-11 ENCOUNTER — Other Ambulatory Visit: Payer: Self-pay | Admitting: Family Medicine

## 2017-03-17 ENCOUNTER — Encounter: Payer: Self-pay | Admitting: Family Medicine

## 2017-03-17 ENCOUNTER — Ambulatory Visit (INDEPENDENT_AMBULATORY_CARE_PROVIDER_SITE_OTHER): Payer: 59 | Admitting: Family Medicine

## 2017-03-17 VITALS — BP 113/79 | HR 71 | Temp 97.5°F | Resp 16 | Ht 68.0 in | Wt 385.5 lb

## 2017-03-17 DIAGNOSIS — E11622 Type 2 diabetes mellitus with other skin ulcer: Secondary | ICD-10-CM | POA: Diagnosis not present

## 2017-03-17 DIAGNOSIS — I482 Chronic atrial fibrillation, unspecified: Secondary | ICD-10-CM

## 2017-03-17 DIAGNOSIS — E1149 Type 2 diabetes mellitus with other diabetic neurological complication: Secondary | ICD-10-CM

## 2017-03-17 DIAGNOSIS — I872 Venous insufficiency (chronic) (peripheral): Secondary | ICD-10-CM

## 2017-03-17 DIAGNOSIS — I83023 Varicose veins of left lower extremity with ulcer of ankle: Secondary | ICD-10-CM

## 2017-03-17 DIAGNOSIS — I10 Essential (primary) hypertension: Secondary | ICD-10-CM | POA: Diagnosis not present

## 2017-03-17 DIAGNOSIS — L97322 Non-pressure chronic ulcer of left ankle with fat layer exposed: Secondary | ICD-10-CM | POA: Diagnosis not present

## 2017-03-17 LAB — BASIC METABOLIC PANEL
BUN: 15 mg/dL (ref 6–23)
CALCIUM: 8.7 mg/dL (ref 8.4–10.5)
CHLORIDE: 102 meq/L (ref 96–112)
CO2: 29 meq/L (ref 19–32)
CREATININE: 1.21 mg/dL (ref 0.40–1.50)
GFR: 63.78 mL/min (ref >60.00)
Glucose, Bld: 119 mg/dL — ABNORMAL HIGH (ref 70–99)
Potassium: 4.3 mEq/L (ref 3.5–5.1)
SODIUM: 140 meq/L (ref 135–145)

## 2017-03-17 LAB — HEMOGLOBIN A1C: HEMOGLOBIN A1C: 6.4 % (ref 4.6–6.5)

## 2017-03-17 NOTE — Progress Notes (Signed)
OFFICE VISIT  03/17/2017   CC:  Chief Complaint  Patient presents with  . Follow-up    [name],    HPI:    Patient is a 66 y.o. Caucasian male who presents for 3 mo f/u DM 2, HTN, chronic a-fib on rate control and anticoag with xarelto, and chronic LE venous insufficiency edema with chronic  LE ankle ulcer (wound clinic).  DM: fastings avg 125-230 fasting and 2H PP.  No hypoglycemia.  HTN: no home bp monitoring.  Compliant with meds.  No dizziness, no generalized weakness.  A-fib: denies feeling any palpitations or racing heart.  L LE: Medial L ankle wound closed up and he will likely get his compression wrap off tomorrow at wound clinic.  Can't do feet exam today due to wearing stockings/wraps  ROS: no fevers, no myalgias, CP, no SOB.  Past Medical History:  Diagnosis Date  . ALLERGIC RHINITIS 08/11/2006  . ASTHMA 08/11/2006  . Chronic atrial fibrillation (HCC) 08/2014  . Chronic constipation   . Chronic renal insufficiency, stage 2 (mild) 2017   GFR low 60s  . DIABETES MELLITUS, TYPE II 08/11/2006  . Diastolic CHF (HCC)   . DM W/RENAL MANIFESTATIONS, TYPE II 04/20/2007  . HYPERTENSION 08/11/2006  . OBESITY, MORBID 12/14/2007  . Venous insufficiency of both lower extremities     Past Surgical History:  Procedure Laterality Date  . TRANSTHORACIC ECHOCARDIOGRAM  11/2007; 09/2014; 11/2015   LV fxn normal, EF normal, mild dilation of left atrium.  2015 grade II diast dysfxn.  2017 EF 55-60%.  Marland Kitchen URETERAL STENT PLACEMENT    . virtual colonoscopy  01/2011   Normal    Outpatient Medications Prior to Visit  Medication Sig Dispense Refill  . albuterol (PROVENTIL HFA;VENTOLIN HFA) 108 (90 Base) MCG/ACT inhaler Inhale 2 puffs into the lungs every 6 (six) hours as needed for wheezing or shortness of breath. 1 Inhaler 1  . b complex vitamins capsule Take 1 capsule by mouth every morning.    . butalbital-acetaminophen-caffeine (FIORICET WITH CODEINE) 50-325-40-30 MG capsule 1-2 caps po q6h  prn headache 90 capsule 1  . cetirizine (ZYRTEC) 10 MG tablet Take 10 mg by mouth daily.    . Cinnamon 500 MG TABS Take by mouth. Reported on 02/19/2016    . Coenzyme Q10 (CO Q 10) 10 MG CAPS Take by mouth. Reported on 02/19/2016    . diltiazem (TIAZAC) 360 MG 24 hr capsule TAKE 1 CAPSULE BY MOUTH  DAILY AS DIRECTED  EQUIVALENT TO TIAZAC 90 capsule 1  . furosemide (LASIX) 40 MG tablet TAKE 2 TABLETS BY MOUTH  DAILY 180 tablet 1  . glucose blood test strip Test blood sugar twice daily 200 each 3  . HUMALOG MIX 75/25 KWIKPEN (75-25) 100 UNIT/ML Kwikpen INJECT SUBCUTANEOUSLY 25  UNITS EVERY MORNING AND 20  UNITS EVERY EVENING AT  SUPPER 45 mL 2  . Insulin Pen Needle (BD ULTRA-FINE PEN NEEDLES) 29G X 12.7MM MISC 1 each by Other route 2 (two) times daily. 200 each 2  . Insulin Pen Needle (BD ULTRA-FINE PEN NEEDLES) 29G X 12.7MM MISC USE TWICE DAILY 200 each 3  . lisinopril (PRINIVIL,ZESTRIL) 40 MG tablet Take 1 tablet by mouth  every day 90 tablet 3  . metFORMIN (GLUCOPHAGE) 1000 MG tablet TAKE 1 TABLET BY MOUTH  TWICE A DAY WITH MEALS 180 tablet 3  . metoprolol (LOPRESSOR) 50 MG tablet TAKE 1 AND 1/2 TABLETS BY  MOUTH TWO TIMES DAILY 270 tablet 3  . Multiple  Vitamin (MULTIVITAMIN) tablet Take 1 tablet by mouth daily.    Marland Kitchen. RELION PEN NEEDLES 29G X 12MM MISC USE  TWICE DAILY 200 each 10  . saw palmetto 500 MG capsule Take 500 mg by mouth daily.    Marland Kitchen. terazosin (HYTRIN) 10 MG capsule Take 1 capsule by mouth  once a day at bedtime 90 capsule 3  . vitamin E 400 UNIT capsule Take 400 Units by mouth every morning. Selenium 50mg     . XARELTO 20 MG TABS tablet TAKE ONE TABLET BY MOUTH ONCE DAILY WITH  SUPPER 90 tablet 1   No facility-administered medications prior to visit.     No Known Allergies  ROS As per HPI  PE: Blood pressure 113/79, pulse 71, temperature 97.5 F (36.4 C), temperature source Oral, resp. rate 16, height 5\' 8"  (1.727 m), weight (!) 385 lb 8 oz (174.9 kg), SpO2 92 %. Gen: Alert,  well appearing.  Patient is oriented to person, place, time, and situation. AFFECT: pleasant, lucid thought and speech. CV: irreg irreg, no m/r. LUNGS: CTA bilat, nonlabored. LE's: no clubbing or cyanosis.  He has compression wrappings and hose on both LL's so can't eval edema or wound.  LABS:  Lab Results  Component Value Date   TSH 2.99 12/09/2016   Lab Results  Component Value Date   WBC 7.5 12/09/2016   HGB 12.7 (L) 12/09/2016   HCT 38.6 (L) 12/09/2016   MCV 84.4 12/09/2016   PLT 263.0 12/09/2016   Lab Results  Component Value Date   CREATININE 1.09 12/09/2016   BUN 13 12/09/2016   NA 139 12/09/2016   K 4.1 12/09/2016   CL 101 12/09/2016   CO2 30 12/09/2016   Lab Results  Component Value Date   ALT 14 12/09/2016   AST 15 12/09/2016   ALKPHOS 72 12/09/2016   BILITOT 1.1 12/09/2016   Lab Results  Component Value Date   CHOL 94 12/09/2016   Lab Results  Component Value Date   HDL 33.90 (L) 12/09/2016   Lab Results  Component Value Date   LDLCALC 51 12/09/2016   Lab Results  Component Value Date   TRIG 45.0 12/09/2016   Lab Results  Component Value Date   CHOLHDL 3 12/09/2016   Lab Results  Component Value Date   PSA 0.30 12/09/2016   PSA 0.21 12/11/2015   PSA 0.37 08/15/2014   Lab Results  Component Value Date   HGBA1C 6.6 (H) 12/09/2016    IMPRESSION AND PLAN:  1) DM 2 with neuropathy and CRI:  HbA1c today. Feet exam next f/u when his wraps/hose are off.  2) HTN; The current medical regimen is effective;  continue present plan and medications.  3) Chronic atrial fibrillation: doing well on rate control and anticoag with xarelto.  4) Chronic LE edema with L medial ankle venous stasis ulcer: improving gradually.  Continue with wound care clinic and diuretics.  Hopefully he'll be completely discharged from wound clinic care by next f/u visit in 3 mo.  5) Jury summons: wrote letter for pt to try to get him excused from this.  See letter for  details.  An After Visit Summary was printed and given to the patient.  FOLLOW UP: Return in about 3 months (around 06/17/2017) for routine chronic illness f/u.  Signed:  Santiago BumpersPhil McGowen, MD           03/17/2017

## 2017-03-18 ENCOUNTER — Encounter: Payer: Self-pay | Admitting: *Deleted

## 2017-03-24 DIAGNOSIS — E11622 Type 2 diabetes mellitus with other skin ulcer: Secondary | ICD-10-CM | POA: Diagnosis not present

## 2017-04-01 DIAGNOSIS — E11622 Type 2 diabetes mellitus with other skin ulcer: Secondary | ICD-10-CM | POA: Diagnosis not present

## 2017-04-07 ENCOUNTER — Encounter (HOSPITAL_BASED_OUTPATIENT_CLINIC_OR_DEPARTMENT_OTHER): Payer: 59 | Attending: Internal Medicine

## 2017-04-07 DIAGNOSIS — I87333 Chronic venous hypertension (idiopathic) 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: Secondary | ICD-10-CM | POA: Insufficient documentation

## 2017-04-07 DIAGNOSIS — L97221 Non-pressure chronic ulcer of left calf limited to breakdown of skin: Secondary | ICD-10-CM | POA: Diagnosis not present

## 2017-04-07 DIAGNOSIS — L97812 Non-pressure chronic ulcer of other part of right lower leg with fat layer exposed: Secondary | ICD-10-CM | POA: Diagnosis not present

## 2017-04-07 DIAGNOSIS — I890 Lymphedema, not elsewhere classified: Secondary | ICD-10-CM | POA: Diagnosis not present

## 2017-04-07 DIAGNOSIS — L03116 Cellulitis of left lower limb: Secondary | ICD-10-CM | POA: Diagnosis not present

## 2017-04-07 DIAGNOSIS — I10 Essential (primary) hypertension: Secondary | ICD-10-CM | POA: Diagnosis not present

## 2017-04-07 DIAGNOSIS — E11622 Type 2 diabetes mellitus with other skin ulcer: Secondary | ICD-10-CM | POA: Insufficient documentation

## 2017-04-07 DIAGNOSIS — E1151 Type 2 diabetes mellitus with diabetic peripheral angiopathy without gangrene: Secondary | ICD-10-CM | POA: Diagnosis not present

## 2017-04-07 DIAGNOSIS — E1140 Type 2 diabetes mellitus with diabetic neuropathy, unspecified: Secondary | ICD-10-CM | POA: Insufficient documentation

## 2017-04-11 DIAGNOSIS — I87333 Chronic venous hypertension (idiopathic) 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: Secondary | ICD-10-CM | POA: Diagnosis not present

## 2017-04-14 DIAGNOSIS — I87333 Chronic venous hypertension (idiopathic) 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: Secondary | ICD-10-CM | POA: Diagnosis not present

## 2017-04-21 DIAGNOSIS — I87333 Chronic venous hypertension (idiopathic) 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: Secondary | ICD-10-CM | POA: Diagnosis not present

## 2017-04-22 ENCOUNTER — Other Ambulatory Visit: Payer: Self-pay | Admitting: Family Medicine

## 2017-04-22 NOTE — Telephone Encounter (Signed)
[facility] [place]  [facility] request for xarelto LOV: 03/17/17 Next ov: 06/23/17 Last written: 10/31/16 #90 w/ 0RF

## 2017-04-23 ENCOUNTER — Other Ambulatory Visit: Payer: Self-pay | Admitting: Family Medicine

## 2017-04-23 NOTE — Telephone Encounter (Signed)
[facility].  RF request for terazosin LOV:  03/17/17 Next ov: 06/23/17 Last written: 04/22/16 #90 w/ 3RF  RF request for lisinopril Last written: 05/22/16 #90 w/ 3RF

## 2017-04-28 DIAGNOSIS — I87333 Chronic venous hypertension (idiopathic) 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: Secondary | ICD-10-CM | POA: Diagnosis not present

## 2017-05-05 ENCOUNTER — Encounter (HOSPITAL_BASED_OUTPATIENT_CLINIC_OR_DEPARTMENT_OTHER): Payer: 59 | Attending: Internal Medicine

## 2017-05-05 DIAGNOSIS — I890 Lymphedema, not elsewhere classified: Secondary | ICD-10-CM | POA: Insufficient documentation

## 2017-05-05 DIAGNOSIS — I87333 Chronic venous hypertension (idiopathic) 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: Secondary | ICD-10-CM | POA: Insufficient documentation

## 2017-05-05 DIAGNOSIS — E1140 Type 2 diabetes mellitus with diabetic neuropathy, unspecified: Secondary | ICD-10-CM | POA: Insufficient documentation

## 2017-05-05 DIAGNOSIS — E11622 Type 2 diabetes mellitus with other skin ulcer: Secondary | ICD-10-CM | POA: Diagnosis not present

## 2017-05-05 DIAGNOSIS — L97829 Non-pressure chronic ulcer of other part of left lower leg with unspecified severity: Secondary | ICD-10-CM | POA: Insufficient documentation

## 2017-05-05 DIAGNOSIS — I10 Essential (primary) hypertension: Secondary | ICD-10-CM | POA: Diagnosis not present

## 2017-05-05 DIAGNOSIS — E1151 Type 2 diabetes mellitus with diabetic peripheral angiopathy without gangrene: Secondary | ICD-10-CM | POA: Diagnosis not present

## 2017-05-05 DIAGNOSIS — L03115 Cellulitis of right lower limb: Secondary | ICD-10-CM | POA: Insufficient documentation

## 2017-05-05 DIAGNOSIS — L97212 Non-pressure chronic ulcer of right calf with fat layer exposed: Secondary | ICD-10-CM | POA: Insufficient documentation

## 2017-05-05 DIAGNOSIS — L97222 Non-pressure chronic ulcer of left calf with fat layer exposed: Secondary | ICD-10-CM | POA: Diagnosis not present

## 2017-05-12 DIAGNOSIS — I87333 Chronic venous hypertension (idiopathic) 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: Secondary | ICD-10-CM | POA: Diagnosis not present

## 2017-05-19 DIAGNOSIS — I87333 Chronic venous hypertension (idiopathic) 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: Secondary | ICD-10-CM | POA: Diagnosis not present

## 2017-05-22 DIAGNOSIS — I87333 Chronic venous hypertension (idiopathic) 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: Secondary | ICD-10-CM | POA: Diagnosis not present

## 2017-05-26 DIAGNOSIS — I87333 Chronic venous hypertension (idiopathic) 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: Secondary | ICD-10-CM | POA: Diagnosis not present

## 2017-06-02 DIAGNOSIS — I87333 Chronic venous hypertension (idiopathic) 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: Secondary | ICD-10-CM | POA: Diagnosis not present

## 2017-06-05 ENCOUNTER — Encounter (HOSPITAL_BASED_OUTPATIENT_CLINIC_OR_DEPARTMENT_OTHER): Payer: 59 | Attending: Internal Medicine

## 2017-06-05 DIAGNOSIS — L97819 Non-pressure chronic ulcer of other part of right lower leg with unspecified severity: Secondary | ICD-10-CM | POA: Diagnosis not present

## 2017-06-05 DIAGNOSIS — E1140 Type 2 diabetes mellitus with diabetic neuropathy, unspecified: Secondary | ICD-10-CM | POA: Insufficient documentation

## 2017-06-05 DIAGNOSIS — I87333 Chronic venous hypertension (idiopathic) 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: Secondary | ICD-10-CM | POA: Insufficient documentation

## 2017-06-05 DIAGNOSIS — L97222 Non-pressure chronic ulcer of left calf with fat layer exposed: Secondary | ICD-10-CM | POA: Insufficient documentation

## 2017-06-05 DIAGNOSIS — I10 Essential (primary) hypertension: Secondary | ICD-10-CM | POA: Diagnosis not present

## 2017-06-05 DIAGNOSIS — L97822 Non-pressure chronic ulcer of other part of left lower leg with fat layer exposed: Secondary | ICD-10-CM | POA: Diagnosis not present

## 2017-06-05 DIAGNOSIS — L03116 Cellulitis of left lower limb: Secondary | ICD-10-CM | POA: Diagnosis not present

## 2017-06-09 ENCOUNTER — Encounter (HOSPITAL_BASED_OUTPATIENT_CLINIC_OR_DEPARTMENT_OTHER): Payer: 59

## 2017-06-09 DIAGNOSIS — I87333 Chronic venous hypertension (idiopathic) 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: Secondary | ICD-10-CM | POA: Diagnosis not present

## 2017-06-12 DIAGNOSIS — I87333 Chronic venous hypertension (idiopathic) 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: Secondary | ICD-10-CM | POA: Diagnosis not present

## 2017-06-16 ENCOUNTER — Encounter: Payer: Self-pay | Admitting: Infectious Diseases

## 2017-06-16 ENCOUNTER — Ambulatory Visit (INDEPENDENT_AMBULATORY_CARE_PROVIDER_SITE_OTHER): Payer: 59 | Admitting: Infectious Diseases

## 2017-06-16 VITALS — BP 177/80 | HR 79 | Temp 98.0°F | Wt 382.4 lb

## 2017-06-16 DIAGNOSIS — L03116 Cellulitis of left lower limb: Secondary | ICD-10-CM

## 2017-06-16 DIAGNOSIS — L03119 Cellulitis of unspecified part of limb: Secondary | ICD-10-CM

## 2017-06-16 DIAGNOSIS — I10 Essential (primary) hypertension: Secondary | ICD-10-CM

## 2017-06-16 DIAGNOSIS — B353 Tinea pedis: Secondary | ICD-10-CM

## 2017-06-16 DIAGNOSIS — R609 Edema, unspecified: Secondary | ICD-10-CM | POA: Diagnosis not present

## 2017-06-16 DIAGNOSIS — I87333 Chronic venous hypertension (idiopathic) 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: Secondary | ICD-10-CM | POA: Diagnosis not present

## 2017-06-16 DIAGNOSIS — I5032 Chronic diastolic (congestive) heart failure: Secondary | ICD-10-CM | POA: Diagnosis not present

## 2017-06-16 MED ORDER — CLOTRIMAZOLE 1 % EX CREA: 1.0000 "application " | TOPICAL_CREAM | Freq: Two times a day (BID) | CUTANEOUS | 3 refills | Status: DC

## 2017-06-16 MED ORDER — CLINDAMYCIN HCL 150 MG PO CAPS: 150.0000 mg | ORAL_CAPSULE | Freq: Every day | ORAL | 4 refills | Status: DC

## 2017-06-16 NOTE — Patient Instructions (Addendum)
Continue your current course of Augmentin + Doxycycline.   Once you are done I would like for you to start taking Clindamycin 150 mg once a day to help try to keep this infection down. Please call if you develop diarrhea with more than 3 loose/watery bowel movements in 1 day.   Please make sure your doctor on Monday checks your potassium levels.   Please start applying clotrimazole cream between your toes and toe nails per package instructions  Will see you back in 3 weeks.

## 2017-06-16 NOTE — Assessment & Plan Note (Signed)
[name] has been on intermittent antibiotic therapy at least 4 times in the last year for recurrent cellulitis of the LLE. I do think it is reasonable to place him on chronic suppression therapy for this. Given he had a previous wound culture + for MRSA, I think it may be best to look to clindamycin for this treatment to cover staph and strep. Will begin with 150 mg QD for now - can increase to 300 mg QD. I have instructed him to finish out the duration of Doxycycline+Augmentin as his leg is improved overall and fevers have resolved. I do think much of the redness observed today is due to chronic venous insufficiency and inability to stay off his feet (toes and forefoot with significant edema today, however pulses are easily palpable and skin is cool to the touch). I also worry about fissure formation between toes and tinea infection of the foot - will send in clotrimazole cream BID for now although he may need systemic treatment as he constantly has his foot wrapped and unexposed to apply. I have asked him to have his would team consider leaving his toes unwrapped.   The last flare he had reported systemic symptoms and was recommended IV induction for a few days to which he declined d/t work. I reinforced today that he is at high risk for recurrence given his lymphedema and may likely need IV antibiotic therapy in the future. He will follow up with me again in 3-4 weeks and maintain wound care in the mean time.

## 2017-06-16 NOTE — Progress Notes (Signed)
Patient Name: [name]  MRN: [id number]  DOB: June 21, 1951    Reason for Visit: Recurrent cellulitis   Referring Provider: Dr. Leanord Hawking [facility])  INFECTIOUS DISEASE OFFICE CONSULT SUBJECTIVE:  HPI: [name] is a 66 y.o. male that was referred for evaluation of his recurrent cellulitis of his lower extremities. He has been followed closely by the [facility] since March 2017. He has a long standing history of B/L venous stasis with ulcerations, chronic lower extremity swelling / lymphadenopathy and weeping. Significant pmhx includes T2DM with insulin requirement, morbid obesity, diastolic CHF and significant swelling of his lower extremities / lymphedema. Last HgbA1C 6.4% in May 2018. ABI in April 2017 did not require intervention and LE venous dopplers 10/2016 without deep or superficial thrombus.   He has struggled with recurrent swelling, pain and erythema of the two open wounds to his left posterior and lateral, and now recently right lateral calf (which developed in June). He reports the right calf is nearly healed. He currently has Unna boots in place and is due to have them changed today at 2:30. Using his lymphedema pumps once daily for about an hour at a time over the last six months. He has concern because all these pumps seem to do is cause 'water' to leak from the open areas on his legs. He does wear compression stockings when he can. He works full time as a Secondary school teacher and is seated/sedentary for most of the day. Tries to elevate legs for at least 1-2 hours when he is home. Currently taking 80 mg of PO lasix daily. Urinates about 3 hours after initial dose and then 1-2 more times after that. He drinks fluids freely throughout the day.   Wound culture that was obtained after debridement of left calf in April 2018 revealed abundant MRSA. At a recent visit on 7/30 he was prescribed Doxycycline + Augmentinon for 7 days initially; at reassessment  he tells me he was advised to continue for another 20 days. He declined IV antibiotics that was recommended in setting of systemic symptoms including fevers.   Today he reports no fevers, chills, nausea, malaise or pain in his left leg/foot. He reports he has near full sensation of his left foot. He is very concerned about the amount of drainage he deals with daily and tells me his foot is constantly soaking wet. He has about 5 days left of his antibiotics.    Patient Active Problem List   Diagnosis Date Noted  . Chronic diastolic heart failure (HCC) 12/24/2016  . Peripheral edema 02/22/2016  . Acute diastolic congestive heart failure (HCC) 10/27/2015  . Dyspnea 10/24/2015  . Wheezing 10/24/2015  . Acute bronchitis 10/24/2015  . 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  . Chronic headaches 05/10/2015  . Venous stasis of both lower extremities 02/14/2015  . Equivocal stress test 10/24/2014  . Atrial fibrillation (HCC) 08/15/2014  . Health maintenance examination 08/15/2014  . Type 2 diabetes mellitus with diabetic neuropathy (HCC) 08/15/2014  . 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 Cellulitis 08/02/2013  . Dysuria 08/02/2013  . Edema of both legs 11/04/2008  . Morbid obesity due to excess calories (HCC) 12/14/2007  . VENOUS INSUFFICIENCY, LEGS 07/22/2007  . Diabetic renal disease (HCC) 04/20/2007  . DM2 (diabetes mellitus, type 2) (HCC) 08/11/2006  . Hypertension, essential 08/11/2006  . ALLERGIC RHINITIS 08/11/2006  . ASTHMA 08/11/2006    Patient's Medications  New Prescriptions   CLINDAMYCIN (CLEOCIN) 150 MG CAPSULE  Take 1 capsule (150 mg total) by mouth daily.   CLOTRIMAZOLE (LOTRIMIN) 1 % CREAM    Apply 1 application topically 2 (two) times daily. To toe nails and bottom of your left foot.  Previous Medications   ALBUTEROL (PROVENTIL HFA;VENTOLIN HFA) 108 (90 BASE) MCG/ACT INHALER    Inhale 2 puffs into the lungs every 6 (six) hours as needed for wheezing or  shortness of breath.   B COMPLEX VITAMINS CAPSULE    Take 1 capsule by mouth every morning.   BUTALBITAL-ACETAMINOPHEN-CAFFEINE (FIORICET WITH CODEINE) 50-325-40-30 MG CAPSULE    1-2 caps po q6h prn headache   CETIRIZINE (ZYRTEC) 10 MG TABLET    Take 10 mg by mouth daily.   CINNAMON 500 MG TABS    Take by mouth. Reported on 02/19/2016   COENZYME Q10 (CO Q 10) 10 MG CAPS    Take by mouth. Reported on 02/19/2016   DILTIAZEM (TIAZAC) 360 MG 24 HR CAPSULE    TAKE 1 CAPSULE BY MOUTH  DAILY AS DIRECTED  EQUIVALENT TO TIAZAC   FUROSEMIDE (LASIX) 40 MG TABLET    TAKE 2 TABLETS BY MOUTH  DAILY   GLUCOSE BLOOD TEST STRIP    Test blood sugar twice daily   HUMALOG MIX 75/25 KWIKPEN (75-25) 100 UNIT/ML KWIKPEN    INJECT SUBCUTANEOUSLY 25  UNITS EVERY MORNING AND 20  UNITS EVERY EVENING AT  SUPPER   INSULIN PEN NEEDLE (BD ULTRA-FINE PEN NEEDLES) 29G X 12.7MM MISC    1 each by Other route 2 (two) times daily.   INSULIN PEN NEEDLE (BD ULTRA-FINE PEN NEEDLES) 29G X 12.7MM MISC    USE TWICE DAILY   LISINOPRIL (PRINIVIL,ZESTRIL) 40 MG TABLET    TAKE 1 TABLET BY MOUTH  EVERY DAY   METFORMIN (GLUCOPHAGE) 1000 MG TABLET    TAKE 1 TABLET BY MOUTH  TWICE A DAY WITH MEALS   METOPROLOL (LOPRESSOR) 50 MG TABLET    TAKE 1 AND 1/2 TABLETS BY  MOUTH TWO TIMES DAILY   MULTIPLE VITAMIN (MULTIVITAMIN) TABLET    Take 1 tablet by mouth daily.   NUTRITIONAL SUPPLEMENTS (GRAPESEED EXTRACT PO)    Take 1 capsule by mouth daily.   RELION PEN NEEDLES 29G X MISC    USE  TWICE DAILY   SAW PALMETTO 500 MG CAPSULE    Take 500 mg by mouth daily.   TERAZOSIN (HYTRIN) 10 MG CAPSULE    TAKE 1 CAPSULE BY MOUTH  ONCE A DAY AT BEDTIME   VITAMIN E 400 UNIT CAPSULE    Take 400 Units by mouth every morning. Selenium 50mg    XARELTO 20 MG TABS TABLET    TAKE ONE TABLET BY MOUTH ONCE DAILY WITH  SUPPER  Modified Medications   No medications on file  Discontinued Medications   No medications on file    Review of Systems  Constitutional:  Negative for chills, fever, malaise/fatigue and weight loss.  HENT: Negative for sore throat.        No dental problems  Respiratory: Negative for cough and sputum production.   Cardiovascular: Positive for leg swelling. Negative for chest pain, palpitations and claudication.  Gastrointestinal: Negative for abdominal pain, diarrhea (at onset of Augmentin use however none now) and vomiting.  Genitourinary: Negative for dysuria and flank pain.  Skin: Negative for rash.  Neurological: Negative for dizziness and headaches.    Past Medical History:  Diagnosis Date  . ALLERGIC RHINITIS 08/11/2006  . ASTHMA 08/11/2006  . Chronic atrial  fibrillation (HCC) 08/2014  . Chronic constipation   . DIABETES MELLITUS, TYPE II 08/11/2006  . Diastolic CHF (HCC)   . DM W/RENAL MANIFESTATIONS, TYPE II 04/20/2007  . HYPERTENSION 08/11/2006  . OBESITY, MORBID 12/14/2007  . Venous insufficiency of both lower extremities     Social History  Substance Use Topics  . Smoking status: Never Smoker  . Smokeless tobacco: Never Used  . Alcohol use No    Family History  Problem Relation Age of Onset  . Diabetes Mother   . Diabetes Sister   . Hydrocephalus Sister        NPH     No Known Allergies   OBJECTIVE: Vitals:   06/16/17 0833  BP: (!) 177/80  Pulse: 79  Temp: 98 F (36.7 C)  TempSrc: Oral  Weight: (!) 382 lb 6.4 oz (173.5 kg)   Body mass index is 58.14 kg/m.  Physical Exam  Constitutional: He is oriented to person, place, and time.  Non-toxic appearance.  Pleasant, obese gentleman with bilateral legs wrapped with unna boots and plastic bags to contain weeping.   Neck: Neck supple. No JVD present.  Cardiovascular: Normal rate, regular rhythm and intact distal pulses.   No murmur heard. Pulmonary/Chest: Effort normal and breath sounds normal. He has no wheezes. He has no rales.  Abdominal: Soft. He exhibits no distension. There is no tenderness.  Musculoskeletal: He exhibits no edema or  tenderness.  3+ edema up through mid thigh on LLE above [employer] Large amount of pooling to posterior thigh with very tight skin that does blanch. I unwrapped boot and entire midfoot was blanched with forefoot and toes very swollen and red. Toenails thickened and plantar aspect of left foot with maceration and white coating that is not easily removed. Very very wet dressings with large amounts of serous fluid. Slight odor present.   Neurological: He is alert and oriented to person, place, and time.  Skin: Skin is warm and dry.  Psychiatric: Affect normal.   Left Lower Extremity posterior/medial wound - entire area of leg with chronic venous skin changes and edema present. Leg is cool to the touch. Small slough was removed with the silver alginate dressing. Wound base with surrounding erythema. Serous drainage constantly leaking from site. New area on shin open. Lateral wound (not pictured) difficult to find and no purulence to this site. Still open however very shallow.     ASSESSMENT & PLAN:  Problem List Items Addressed This Visit      Cardiovascular and Mediastinum   Hypertension, essential    BP elevated above target. Reports he did take his lisinopril this morning but not lasix yet. He reports it is normally under better control. This is likely contributing to his overall edema and persistent infection as well. Will defer ongoing management to PCP at his upcoming visit.       Chronic diastolic heart failure (HCC)     Other   Peripheral edema    Currently on 80 mg lasix daily. No potassium supplement. This was uptitrated from Dr. Leanord Hawking. I would like to check CMET today however he requested to wait for his primary MD visit on this coming Monday with Dr. Milinda Cave. Will send him a note about today's visit. Perhaps Mr. Ezekiel would have better results with torsemide vs. furosemide as it sounds like he does not produce much urine with the dose. I have also asked him to limit his fluid intake  to ~2L daily to assist with control of  edema and see if there is a way to elevate his leg at work periodically and avoid prolonged standing periods. This will be a chronic problem for him if fluid status cannot be controlled.        Cellulitis    Mr. Kumpf has been on intermittent antibiotic therapy at least 4 times in the last year for recurrent cellulitis of the LLE. I do think it is reasonable to place him on chronic suppression therapy for this. Given he had a previous wound culture + for MRSA, I think it may be best to look to clindamycin for this treatment to cover staph and strep. Will begin with 150 mg QD for now - can increase to 300 mg QD. I have instructed him to finish out the duration of Doxycycline+Augmentin as his leg is improved overall and fevers have resolved. I do think much of the redness observed today is due to chronic venous insufficiency and inability to stay off his feet (toes and forefoot with significant edema today, however pulses are easily palpable and skin is cool to the touch). I also worry about fissure formation between toes and tinea infection of the foot - will send in clotrimazole cream BID for now although he may need systemic treatment as he constantly has his foot wrapped and unexposed to apply. I have asked him to have his would team consider leaving his toes unwrapped.   The last flare he had reported systemic symptoms and was recommended IV induction for a few days to which he declined d/t work. I reinforced today that he is at high risk for recurrence given his lymphedema and may likely need IV antibiotic therapy in the future. He will follow up with me again in 3-4 weeks and maintain wound care in the mean time.        Other Visit Diagnoses    Recurrent cellulitis of lower extremity    -  Primary   Relevant Medications   clindamycin (CLEOCIN) 150 MG capsule   Tinea pedis of left foot       Relevant Medications   clotrimazole (LOTRIMIN) 1 % cream    clindamycin (CLEOCIN) 150 MG capsule      I spent 45 minutes with the patient today including wound care; 50% of the time face-to-face counseling of his chronic conditions related to lymphedema, cellulitis and role of chronic suppressive therapy with antibiotics.   Rexene AlbertsStephanie Gladiola Madore, MSN, [facility] HospitalFNP-C [facility]  06/16/2017  1:05 PM

## 2017-06-16 NOTE — Assessment & Plan Note (Signed)
Currently on 80 mg lasix daily. No potassium supplement. This was uptitrated from Dr. Leanord Hawkingobson. I would like to check CMET today however he requested to wait for his primary MD visit on this coming Monday with Dr. Milinda CaveMcGowen. Will send him a note about today's visit. Perhaps [name] would have better results with torsemide vs. furosemide as it sounds like he does not produce much urine with the dose. I have also asked him to limit his fluid intake to ~2L daily to assist with control of edema and see if there is a way to elevate his leg at work periodically and avoid prolonged standing periods. This will be a chronic problem for him if fluid status cannot be controlled.

## 2017-06-16 NOTE — Assessment & Plan Note (Signed)
BP elevated above target. Reports he did take his lisinopril this morning but not lasix yet. He reports it is normally under better control. This is likely contributing to his overall edema and persistent infection as well. Will defer ongoing management to PCP at his upcoming visit.

## 2017-06-18 ENCOUNTER — Encounter: Payer: Self-pay | Admitting: Family Medicine

## 2017-06-19 DIAGNOSIS — I87333 Chronic venous hypertension (idiopathic) 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: Secondary | ICD-10-CM | POA: Diagnosis not present

## 2017-06-23 ENCOUNTER — Ambulatory Visit (INDEPENDENT_AMBULATORY_CARE_PROVIDER_SITE_OTHER): Payer: 59 | Admitting: Family Medicine

## 2017-06-23 ENCOUNTER — Encounter: Payer: Self-pay | Admitting: *Deleted

## 2017-06-23 ENCOUNTER — Encounter: Payer: Self-pay | Admitting: Family Medicine

## 2017-06-23 VITALS — BP 118/60 | HR 77 | Temp 97.8°F | Resp 16 | Ht 68.0 in | Wt 382.0 lb

## 2017-06-23 DIAGNOSIS — I10 Essential (primary) hypertension: Secondary | ICD-10-CM | POA: Diagnosis not present

## 2017-06-23 DIAGNOSIS — R600 Localized edema: Secondary | ICD-10-CM

## 2017-06-23 DIAGNOSIS — I87333 Chronic venous hypertension (idiopathic) 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: Secondary | ICD-10-CM | POA: Diagnosis not present

## 2017-06-23 DIAGNOSIS — E1149 Type 2 diabetes mellitus with other diabetic neurological complication: Secondary | ICD-10-CM | POA: Diagnosis not present

## 2017-06-23 DIAGNOSIS — I872 Venous insufficiency (chronic) (peripheral): Secondary | ICD-10-CM

## 2017-06-23 LAB — BASIC METABOLIC PANEL
BUN: 17 mg/dL (ref 6–23)
CALCIUM: 8.7 mg/dL (ref 8.4–10.5)
CHLORIDE: 103 meq/L (ref 96–112)
CO2: 30 meq/L (ref 19–32)
Creatinine, Ser: 1.21 mg/dL (ref 0.40–1.50)
GFR: 63.73 mL/min (ref >60.00)
GLUCOSE: 126 mg/dL — AB (ref 70–99)
Potassium: 4.8 mEq/L (ref 3.5–5.1)
SODIUM: 140 meq/L (ref 135–145)

## 2017-06-23 LAB — HEMOGLOBIN A1C: Hgb A1c MFr Bld: 6.7 % — ABNORMAL HIGH (ref 4.6–6.5)

## 2017-06-23 MED ORDER — FUROSEMIDE 40 MG PO TABS: ORAL_TABLET | ORAL | 1 refills | Status: DC

## 2017-06-23 NOTE — Progress Notes (Signed)
OFFICE VISIT  06/23/2017   CC:  Chief Complaint  Patient presents with  . Follow-up    [name], pt is fasting.    HPI:    Patient is a 66 y.o. Caucasian male who presents for 3 mo f/u DM2, HTN, venous insuff of both lower extremities. Says he is doing OK, but frustrated about his swollen legs.  Has been seeing wound care clinic for a year now.  Lower ext edema/venous insuff: still struggling with this, R LE wound re-opened again, L LE still weeping signif. Recent trip to ID for recurrent cellulitis, was put on clindamycin but he cannot tolerate this due to diarrhea. He will call ID for change of abx. Says he only drinks 24 oz fluids per day.  His wt is up 3 lbs since last visit here 12/2016.  DM 2: range 80-170.  Using sliding scale Humalog 75/25 and he finds this is working out well for him. He is wearing compression hose and bandages again so I am unable to do a diabetic foot exam today.  HTN: no home monitoring.  Compliant with meds.  Past Medical History:  Diagnosis Date  . ALLERGIC RHINITIS 08/11/2006  . ASTHMA 08/11/2006  . Chronic atrial fibrillation (HCC) 08/2014  . Chronic constipation   . DIABETES MELLITUS, TYPE II 08/11/2006  . Diastolic CHF (HCC)   . DM W/RENAL MANIFESTATIONS, TYPE II 04/20/2007  . HYPERTENSION 08/11/2006  . OBESITY, MORBID 12/14/2007  . Recurrent cellulitis of lower leg 2017-18   ID consult 06/2017--they recommended he start daily clindamycin suppression (hx of MRSA)  . Venous insufficiency of both lower extremities   . Venous stasis ulcers of both lower extremities (HCC)    wound clinic care ongoing as of 06/2017    Past Surgical History:  Procedure Laterality Date  . TRANSTHORACIC ECHOCARDIOGRAM  11/2007; 09/2014; 11/2015   LV fxn normal, EF normal, mild dilation of left atrium.  2015 grade II diast dysfxn.  2017 EF 55-60%.  Marland Kitchen URETERAL STENT PLACEMENT    . virtual colonoscopy  01/2011   Normal    Outpatient Medications Prior to Visit  Medication Sig  Dispense Refill  . albuterol (PROVENTIL HFA;VENTOLIN HFA) 108 (90 Base) MCG/ACT inhaler Inhale 2 puffs into the lungs every 6 (six) hours as needed for wheezing or shortness of breath. 1 Inhaler 1  . b complex vitamins capsule Take 1 capsule by mouth every morning.    . butalbital-acetaminophen-caffeine (FIORICET WITH CODEINE) 50-325-40-30 MG capsule 1-2 caps po q6h prn headache 90 capsule 1  . cetirizine (ZYRTEC) 10 MG tablet Take 10 mg by mouth daily.    . Cinnamon 500 MG TABS Take by mouth. Reported on 02/19/2016    . clindamycin (CLEOCIN) 150 MG capsule Take 1 capsule (150 mg total) by mouth daily. 30 capsule 4  . clotrimazole (LOTRIMIN) 1 % cream Apply 1 application topically 2 (two) times daily. To toe nails and bottom of your left foot. 30 g 3  . Coenzyme Q10 (CO Q 10) 10 MG CAPS Take by mouth. Reported on 02/19/2016    . diltiazem (TIAZAC) 360 MG 24 hr capsule TAKE 1 CAPSULE BY MOUTH  DAILY AS DIRECTED  EQUIVALENT TO TIAZAC 90 capsule 1  . furosemide (LASIX) 40 MG tablet TAKE 2 TABLETS BY MOUTH  DAILY 180 tablet 1  . glucose blood test strip Test blood sugar twice daily 200 each 3  . HUMALOG MIX 75/25 KWIKPEN (75-25) 100 UNIT/ML Kwikpen INJECT SUBCUTANEOUSLY 25  UNITS EVERY MORNING  AND 20  UNITS EVERY EVENING AT  SUPPER 45 mL 2  . Insulin Pen Needle (BD ULTRA-FINE PEN NEEDLES) 29G X 12.7MM MISC 1 each by Other route 2 (two) times daily. 200 each 2  . Insulin Pen Needle (BD ULTRA-FINE PEN NEEDLES) 29G X 12.7MM MISC USE TWICE DAILY 200 each 3  . lisinopril (PRINIVIL,ZESTRIL) 40 MG tablet TAKE 1 TABLET BY MOUTH  EVERY DAY 90 tablet 1  . metFORMIN (GLUCOPHAGE) 1000 MG tablet TAKE 1 TABLET BY MOUTH  TWICE A DAY WITH MEALS 180 tablet 3  . metoprolol (LOPRESSOR) 50 MG tablet TAKE 1 AND 1/2 TABLETS BY  MOUTH TWO TIMES DAILY 270 tablet 3  . Multiple Vitamin (MULTIVITAMIN) tablet Take 1 tablet by mouth daily.    . Nutritional Supplements (GRAPESEED EXTRACT PO) Take 1 capsule by mouth daily.    Marland Kitchen  RELION PEN NEEDLES 29G X MISC USE  TWICE DAILY 200 each 10  . saw palmetto 500 MG capsule Take 500 mg by mouth daily.    Marland Kitchen terazosin (HYTRIN) 10 MG capsule TAKE 1 CAPSULE BY MOUTH  ONCE A DAY AT BEDTIME 90 capsule 1  . vitamin E 400 UNIT capsule Take 400 Units by mouth every morning. Selenium 50mg     . XARELTO 20 MG TABS tablet TAKE ONE TABLET BY MOUTH ONCE DAILY WITH  SUPPER 90 tablet 1   No facility-administered medications prior to visit.     No Known Allergies  ROS As per HPI  ON:GEXBMWU bp today 146/68.  F/u BP 10 min later 118/60. Blood pressure (!) 146/68, pulse 77, temperature 97.8 F (36.6 C), temperature source Oral, resp. rate 16, height 5\' 8"  (1.727 m), weight (!) 382 lb (173.3 kg), SpO2 93 %. Gen: Alert, well appearing.  Patient is oriented to person, place, time, and situation. AFFECT: pleasant, lucid thought and speech. No further exam today.  LABS:  Lab Results  Component Value Date   TSH 2.99 12/09/2016   Lab Results  Component Value Date   WBC 7.5 12/09/2016   HGB 12.7 (L) 12/09/2016   HCT 38.6 (L) 12/09/2016   MCV 84.4 12/09/2016   PLT 263.0 12/09/2016   Lab Results  Component Value Date   CREATININE 1.21 03/17/2017   BUN 15 03/17/2017   NA 140 03/17/2017   K 4.3 03/17/2017   CL 102 03/17/2017   CO2 29 03/17/2017   Lab Results  Component Value Date   ALT 14 12/09/2016   AST 15 12/09/2016   ALKPHOS 72 12/09/2016   BILITOT 1.1 12/09/2016   Lab Results  Component Value Date   CHOL 94 12/09/2016   Lab Results  Component Value Date   HDL 33.90 (L) 12/09/2016   Lab Results  Component Value Date   LDLCALC 51 12/09/2016   Lab Results  Component Value Date   TRIG 45.0 12/09/2016   Lab Results  Component Value Date   CHOLHDL 3 12/09/2016   Lab Results  Component Value Date   PSA 0.30 12/09/2016   PSA 0.21 12/11/2015   PSA 0.37 08/15/2014   Lab Results  Component Value Date   HGBA1C 6.4 03/17/2017   IMPRESSION AND  PLAN:  1) DM 2; historically well controlled. Compliant with meds (and diet for the most part). Unable to do feet exam again today due to pt's extensive lower leg hose and dressings in place. HbA1c drawn today.  2) HTN: The current medical regimen is effective;  continue present plan and medications. BMET today.  3) Chronic LE edema secondary to chronic venous insufficiency: still problematic, with open wounds getting chronic attention from [facility] wound care clinic.  Will keep lasix at 80 mg qAM but will add 40mg  q afternoon. Recheck BMET labs visit 1 week.  If K and Cr stable, may be able to push to 80mg  bid dosing. ID saw him for recurrent cellulitis (MRSA) and started clindamycin, but he will call ID office today for alternative antibiotic due to intolerable diarrhea from clindamycin.  An After Visit Summary was printed and given to the patient.  FOLLOW UP: 3 mo routine chronic illness f/u  Signed:  Santiago Bumpers, MD           06/23/2017

## 2017-06-25 DIAGNOSIS — I87333 Chronic venous hypertension (idiopathic) 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: Secondary | ICD-10-CM | POA: Diagnosis not present

## 2017-06-27 DIAGNOSIS — I87333 Chronic venous hypertension (idiopathic) 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: Secondary | ICD-10-CM | POA: Diagnosis not present

## 2017-06-30 DIAGNOSIS — I87333 Chronic venous hypertension (idiopathic) 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: Secondary | ICD-10-CM | POA: Diagnosis not present

## 2017-07-03 DIAGNOSIS — I87333 Chronic venous hypertension (idiopathic) 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: Secondary | ICD-10-CM | POA: Diagnosis not present

## 2017-07-05 ENCOUNTER — Other Ambulatory Visit: Payer: Self-pay | Admitting: Family Medicine

## 2017-07-10 ENCOUNTER — Encounter (HOSPITAL_BASED_OUTPATIENT_CLINIC_OR_DEPARTMENT_OTHER): Payer: 59 | Attending: Internal Medicine

## 2017-07-10 DIAGNOSIS — L97812 Non-pressure chronic ulcer of other part of right lower leg with fat layer exposed: Secondary | ICD-10-CM | POA: Diagnosis not present

## 2017-07-10 DIAGNOSIS — E1151 Type 2 diabetes mellitus with diabetic peripheral angiopathy without gangrene: Secondary | ICD-10-CM | POA: Insufficient documentation

## 2017-07-10 DIAGNOSIS — L97819 Non-pressure chronic ulcer of other part of right lower leg with unspecified severity: Secondary | ICD-10-CM | POA: Diagnosis not present

## 2017-07-10 DIAGNOSIS — E1140 Type 2 diabetes mellitus with diabetic neuropathy, unspecified: Secondary | ICD-10-CM | POA: Diagnosis not present

## 2017-07-10 DIAGNOSIS — I87333 Chronic venous hypertension (idiopathic) 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: Secondary | ICD-10-CM | POA: Diagnosis present

## 2017-07-10 DIAGNOSIS — L97829 Non-pressure chronic ulcer of other part of left lower leg with unspecified severity: Secondary | ICD-10-CM | POA: Insufficient documentation

## 2017-07-10 DIAGNOSIS — I10 Essential (primary) hypertension: Secondary | ICD-10-CM | POA: Insufficient documentation

## 2017-07-10 DIAGNOSIS — L03115 Cellulitis of right lower limb: Secondary | ICD-10-CM | POA: Insufficient documentation

## 2017-07-10 DIAGNOSIS — E11622 Type 2 diabetes mellitus with other skin ulcer: Secondary | ICD-10-CM | POA: Insufficient documentation

## 2017-07-10 DIAGNOSIS — I890 Lymphedema, not elsewhere classified: Secondary | ICD-10-CM | POA: Diagnosis not present

## 2017-07-10 DIAGNOSIS — Z9119 Patient's noncompliance with other medical treatment and regimen: Secondary | ICD-10-CM | POA: Diagnosis not present

## 2017-07-14 ENCOUNTER — Other Ambulatory Visit: Payer: Self-pay | Admitting: Family Medicine

## 2017-07-14 DIAGNOSIS — I87333 Chronic venous hypertension (idiopathic) 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: Secondary | ICD-10-CM | POA: Diagnosis not present

## 2017-07-15 ENCOUNTER — Encounter: Payer: Self-pay | Admitting: Family Medicine

## 2017-07-21 ENCOUNTER — Encounter: Payer: Self-pay | Admitting: Infectious Diseases

## 2017-07-21 ENCOUNTER — Ambulatory Visit (INDEPENDENT_AMBULATORY_CARE_PROVIDER_SITE_OTHER): Payer: 59 | Admitting: Infectious Diseases

## 2017-07-21 VITALS — BP 186/79 | HR 91 | Temp 97.9°F | Wt 389.0 lb

## 2017-07-21 DIAGNOSIS — L03119 Cellulitis of unspecified part of limb: Secondary | ICD-10-CM

## 2017-07-21 DIAGNOSIS — I87333 Chronic venous hypertension (idiopathic) 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: Secondary | ICD-10-CM | POA: Diagnosis not present

## 2017-07-21 DIAGNOSIS — R600 Localized edema: Secondary | ICD-10-CM

## 2017-07-21 DIAGNOSIS — I872 Venous insufficiency (chronic) (peripheral): Secondary | ICD-10-CM | POA: Diagnosis not present

## 2017-07-21 MED ORDER — AMOXICILLIN 500 MG PO CAPS: 500.0000 mg | ORAL_CAPSULE | Freq: Two times a day (BID) | ORAL | 3 refills | Status: DC

## 2017-07-21 NOTE — Progress Notes (Signed)
Patient Name: [name]  MRN: [id number]  DOB: 02/06/1951    Reason for Visit: Recurrent cellulitis   Referring Provider: Dr. Leanord Hawking [facility])  INFECTIOUS DISEASE OFFICE CONSULT SUBJECTIVE:  HPI: [name] is a 66 y.o. male that was referred for evaluation of his recurrent cellulitis of his lower extremities. He has been followed closely by the [facility] since March 2017. He has a long standing history of B/L venous stasis with ulcerations, chronic lower extremity swelling / lymphadenopathy and weeping. Significant pmhx includes T2DM with insulin requirement, morbid obesity, diastolic CHF and significant swelling of his lower extremities / lymphedema. Last HgbA1C 6.4% in May 2018. ABI in April 2017 did not require intervention and LE venous dopplers 10/2016 without deep or superficial thrombus. Last treatment for cellulitis was 7/30 where he improved with Doxycycline + Augmentin x 1 month. Was recommended but declined IV antibiotics at that time in setting of systemic symptoms including fevers.   Since last office visit he reports drainage to be much improved and is back to 1x/week dressing changes with unna boots. RLE ulcer nearly healed and may be able to resume normal compression stockings after 1-2 more boots per his report. Today he reports no fevers, chills, nausea, malaise or pain in his left leg/foot. He reports he has near full sensation of his left foot. Has an appointment with wound care today. Was unable to tolerate the clindamycin as he started with frequent loose stools after a few doses so has not been maintained on anything since completing last course of antibiotics.    Patient Active Problem List   Diagnosis Date Noted  . Chronic diastolic heart failure (HCC) 12/24/2016  . Peripheral edema 02/22/2016  . Acute diastolic congestive heart failure (HCC) 10/27/2015  . Dyspnea 10/24/2015  . Wheezing 10/24/2015  . Acute bronchitis 10/24/2015  .  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  . Chronic headaches 05/10/2015  . Venous stasis of both lower extremities 02/14/2015  . Equivocal stress test 10/24/2014  . Atrial fibrillation (HCC) 08/15/2014  . Health maintenance examination 08/15/2014  . Type 2 diabetes mellitus with diabetic neuropathy (HCC) 08/15/2014  . 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 Recurrent cellulitis of lower extremity 08/02/2013  . Dysuria 08/02/2013  . Edema of both legs 11/04/2008  . Morbid obesity due to excess calories (HCC) 12/14/2007  . Venous (peripheral) insufficiency 07/22/2007  . Diabetic renal disease (HCC) 04/20/2007  . DM2 (diabetes mellitus, type 2) (HCC) 08/11/2006  . Hypertension, essential 08/11/2006  . ALLERGIC RHINITIS 08/11/2006  . ASTHMA 08/11/2006    Patient's Medications  New Prescriptions   AMOXICILLIN (AMOXIL) 500 MG CAPSULE    Take 1 capsule (500 mg total) by mouth 2 (two) times daily.  Previous Medications   ALBUTEROL (PROVENTIL HFA;VENTOLIN HFA) 108 (90 BASE) MCG/ACT INHALER    Inhale 2 puffs into the lungs every 6 (six) hours as needed for wheezing or shortness of breath.   B COMPLEX VITAMINS CAPSULE    Take 1 capsule by mouth every morning.   BUTALBITAL-ACETAMINOPHEN-CAFFEINE (FIORICET WITH CODEINE) 50-325-40-30 MG CAPSULE    1-2 caps po q6h prn headache   CETIRIZINE (ZYRTEC) 10 MG TABLET    Take 10 mg by mouth daily.   CINNAMON 500 MG TABS    Take by mouth. Reported on 02/19/2016   CLOTRIMAZOLE (LOTRIMIN) 1 % CREAM    Apply 1 application topically 2 (two) times daily. To toe nails and bottom of your left foot.  COENZYME Q10 (CO Q 10) 10 MG CAPS    Take by mouth. Reported on 02/19/2016   DILTIAZEM (TIAZAC) 360 MG 24 HR CAPSULE    TAKE 1 CAPSULE BY MOUTH  DAILY AS DIRECTED  EQUIVALENT TO TIAZAC   FUROSEMIDE (LASIX) 40 MG TABLET    2 tabs po qAM and 1 tab po q afternoon   HUMALOG MIX 75/25 KWIKPEN (75-25) 100 UNIT/ML KWIKPEN    INJECT SUBCUTANEOUSLY 25  UNITS EVERY  MORNING AND 20  UNITS EVERY EVENING AT  SUPPER   INSULIN PEN NEEDLE (BD ULTRA-FINE PEN NEEDLES) 29G X 12.7MM MISC    1 each by Other route 2 (two) times daily.   INSULIN PEN NEEDLE (BD ULTRA-FINE PEN NEEDLES) 29G X 12.7MM MISC    USE TWICE DAILY   INSULIN PEN NEEDLE (RELION PEN NEEDLE 31G/8MM) 31G X 8 MM MISC    1 each by Does not apply route 2 (two) times daily.   LISINOPRIL (PRINIVIL,ZESTRIL) 40 MG TABLET    TAKE 1 TABLET BY MOUTH  EVERY DAY   METFORMIN (GLUCOPHAGE) 1000 MG TABLET    TAKE 1 TABLET BY MOUTH  TWICE A DAY WITH MEALS   METOPROLOL (LOPRESSOR) 50 MG TABLET    TAKE 1 AND 1/2 TABLETS BY  MOUTH TWO TIMES DAILY   MULTIPLE VITAMIN (MULTIVITAMIN) TABLET    Take 1 tablet by mouth daily.   NUTRITIONAL SUPPLEMENTS (GRAPESEED EXTRACT PO)    Take 1 capsule by mouth daily.   ONE TOUCH ULTRA TEST TEST STRIP    TEST BLOOD SUGAR TWO TIMES  DAILY   SAW PALMETTO 500 MG CAPSULE    Take 500 mg by mouth daily.   TERAZOSIN (HYTRIN) 10 MG CAPSULE    TAKE 1 CAPSULE BY MOUTH  ONCE A DAY AT BEDTIME   VITAMIN E 400 UNIT CAPSULE    Take 400 Units by mouth every morning. Selenium    XARELTO 20 MG TABS TABLET    TAKE ONE TABLET BY MOUTH ONCE DAILY WITH  SUPPER  Modified Medications   No medications on file  Discontinued Medications   CLINDAMYCIN (CLEOCIN) 150 MG CAPSULE    Take 1 capsule (150 mg total) by mouth daily.    Review of Systems  Constitutional: Negative for chills, fever, malaise/fatigue and weight loss.  HENT: Negative for sore throat.   Respiratory: Negative for cough and sputum production.   Cardiovascular: Positive for leg swelling. Negative for chest pain, palpitations and claudication.  Gastrointestinal: Negative for abdominal pain, diarrhea (Diarrhea with clindamycin. Resolved ) and vomiting.  Genitourinary: Negative for dysuria and flank pain.  Skin: Negative for rash.  Neurological: Negative for dizziness and headaches.    Past Medical History:  Diagnosis Date  . ALLERGIC  RHINITIS 08/11/2006  . ASTHMA 08/11/2006  . Chronic atrial fibrillation (HCC) 08/2014  . Chronic constipation   . Chronic renal insufficiency, stage 2 (mild)    Borderline stage III (GFR 60s).  . DIABETES MELLITUS, TYPE II 08/11/2006  . Diastolic CHF (HCC)   . DM W/RENAL MANIFESTATIONS, TYPE II 04/20/2007  . HYPERTENSION 08/11/2006  . OBESITY, MORBID 12/14/2007  . 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  . Venous stasis ulcers of both lower extremities (HCC)    wound clinic care ongoing as of 06/2017    Social History  Substance Use Topics  . Smoking status: Never Smoker  . Smokeless  tobacco: Never Used  . Alcohol use No    Family History  Problem Relation Age of Onset  . Diabetes Mother   . Diabetes Sister   . Hydrocephalus Sister        NPH     No Known Allergies   OBJECTIVE: Vitals:   07/21/17 0841  BP: (!) 186/79  Pulse: 91  Temp: 97.9 F (36.6 C)  TempSrc: Oral  Weight: (!) 389 lb (176.4 kg)   Body mass index is 59.15 kg/m.  Physical Exam  Constitutional: He is oriented to person, place, and time.  Non-toxic appearance.  Pleasant, obese gentleman with bilateral legs wrapped with unna boots.  Neck: Neck supple. No JVD present.  Cardiovascular: Normal rate, regular rhythm and intact distal pulses.   No murmur heard. Pulmonary/Chest: Effort normal and breath sounds normal. He has no wheezes. He has no rales.  Abdominal: Soft. He exhibits no distension. There is no tenderness.  Musculoskeletal: He exhibits edema. He exhibits no tenderness.  Ambulates with cane assistance  Neurological: He is alert and oriented to person, place, and time.  Skin: Skin is warm and dry.  Psychiatric: Affect normal.   Left Lower Extremity posterior/medial wound - entire area of leg with chronic venous skin changes and dema present. Leg is cool to the touch however  very tight with pitting edema. Small slough was removed with the silver alginate dressing. Only serous drainage present. Difficult to palpate pulses. Overall improved in swelling and drainage.   RLE - Radio broadcast assistant in place. No redness noted. 3+ edema above dressing. Not removed today.    ASSESSMENT & PLAN:  Problem List Items Addressed This Visit      Cardiovascular and Mediastinum   Venous (peripheral) insufficiency - Primary     Other   Edema of both legs    Encouraged tedious wound care. Advised to increase back to 2x/week dressings with wound clinic should he have increased drainage as it is important to avoid maceration to skin. Tolerating 80 mg lasix daily with good urine output. Does not like the lymphedema pumps but he is trying - advised to continue these, elevated legs as he can at work/home. Fluid management will be essential in controlling cellulitis.       Recurrent cellulitis of lower extremity    Unable to tolerate the clindamycin. No signs of active infection today of open ulcer or surrounding tissues, although they are very tight with fluid. Still getting 1-2 x's a week wraps. Will try Amoxicillin 500 mg BID for prophylaxis since he did well with augmentin in past. Advised to call with s/s of active infection or intolerance to antibiotic. Counseled about continued weight loss/fluid management for control of this condition and prevention of infection. Will have hi return in 6 weeks to reassess.       Relevant Medications   amoxicillin (AMOXIL) 500 MG capsule      Rexene Alberts, MSN, [facility]  07/21/2017  9:24 AM

## 2017-07-21 NOTE — Patient Instructions (Addendum)
STOP clindamycin   Will start you on Amoxicillin one tablet twice a day.   Please call if you cannot tolerate this medication of you have any signs of ACTIVE infection (warmth, pain, increased swelling, fevers/chills)  Will see you back in 6 weeks.

## 2017-07-21 NOTE — Assessment & Plan Note (Signed)
Encouraged tedious wound care. Advised to increase back to 2x/week dressings with wound clinic should he have increased drainage as it is important to avoid maceration to skin. Tolerating 80 mg lasix daily with good urine output. Does not like the lymphedema pumps but he is trying - advised to continue these, elevated legs as he can at work/home. Fluid management will be essential in controlling cellulitis.

## 2017-07-21 NOTE — Assessment & Plan Note (Addendum)
Unable to tolerate the clindamycin. No signs of active infection today of open ulcer or surrounding tissues, although they are very tight with fluid. Still getting 1-2 x's a week wraps. Will try Amoxicillin 500 mg BID for prophylaxis since he did well with augmentin in past. Advised to call with s/s of active infection or intolerance to antibiotic. Counseled about continued weight loss/fluid management for control of this condition and prevention of infection. Will have hi return in 6 weeks to reassess.

## 2017-07-28 ENCOUNTER — Encounter: Payer: Self-pay | Admitting: Family Medicine

## 2017-07-28 DIAGNOSIS — I87333 Chronic venous hypertension (idiopathic) 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: Secondary | ICD-10-CM | POA: Diagnosis not present

## 2017-08-01 IMAGING — DX DG CHEST 2V
2 series · 3 of 3 positions shown · non-contrast
Comparison: PA and lateral chest x-ray October 08, 2010

CLINICAL DATA: Chest congestion, wheezing, and shortness of breath
for the past week; history of diabetes, asthma, and atrial
fibrillation.

EXAM:
CHEST  2 VIEW

[Series 1: chest pa · 0.14mm/px · 2 of 2 slices shown]
[im 1/2]
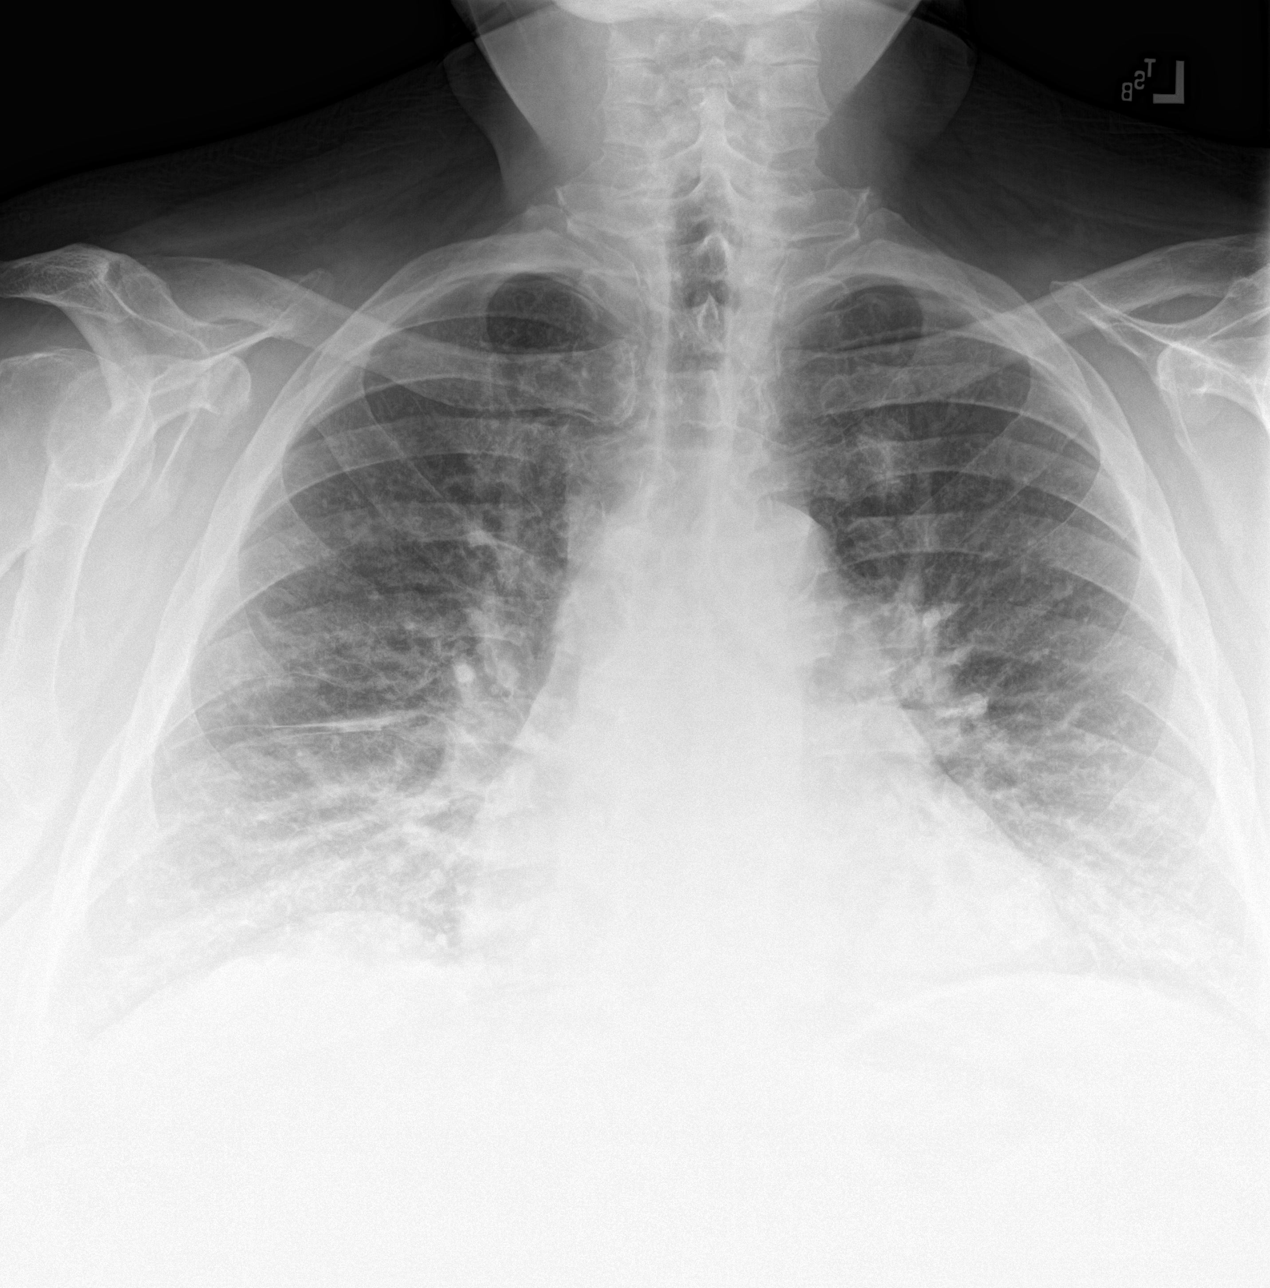
[im 2/2]
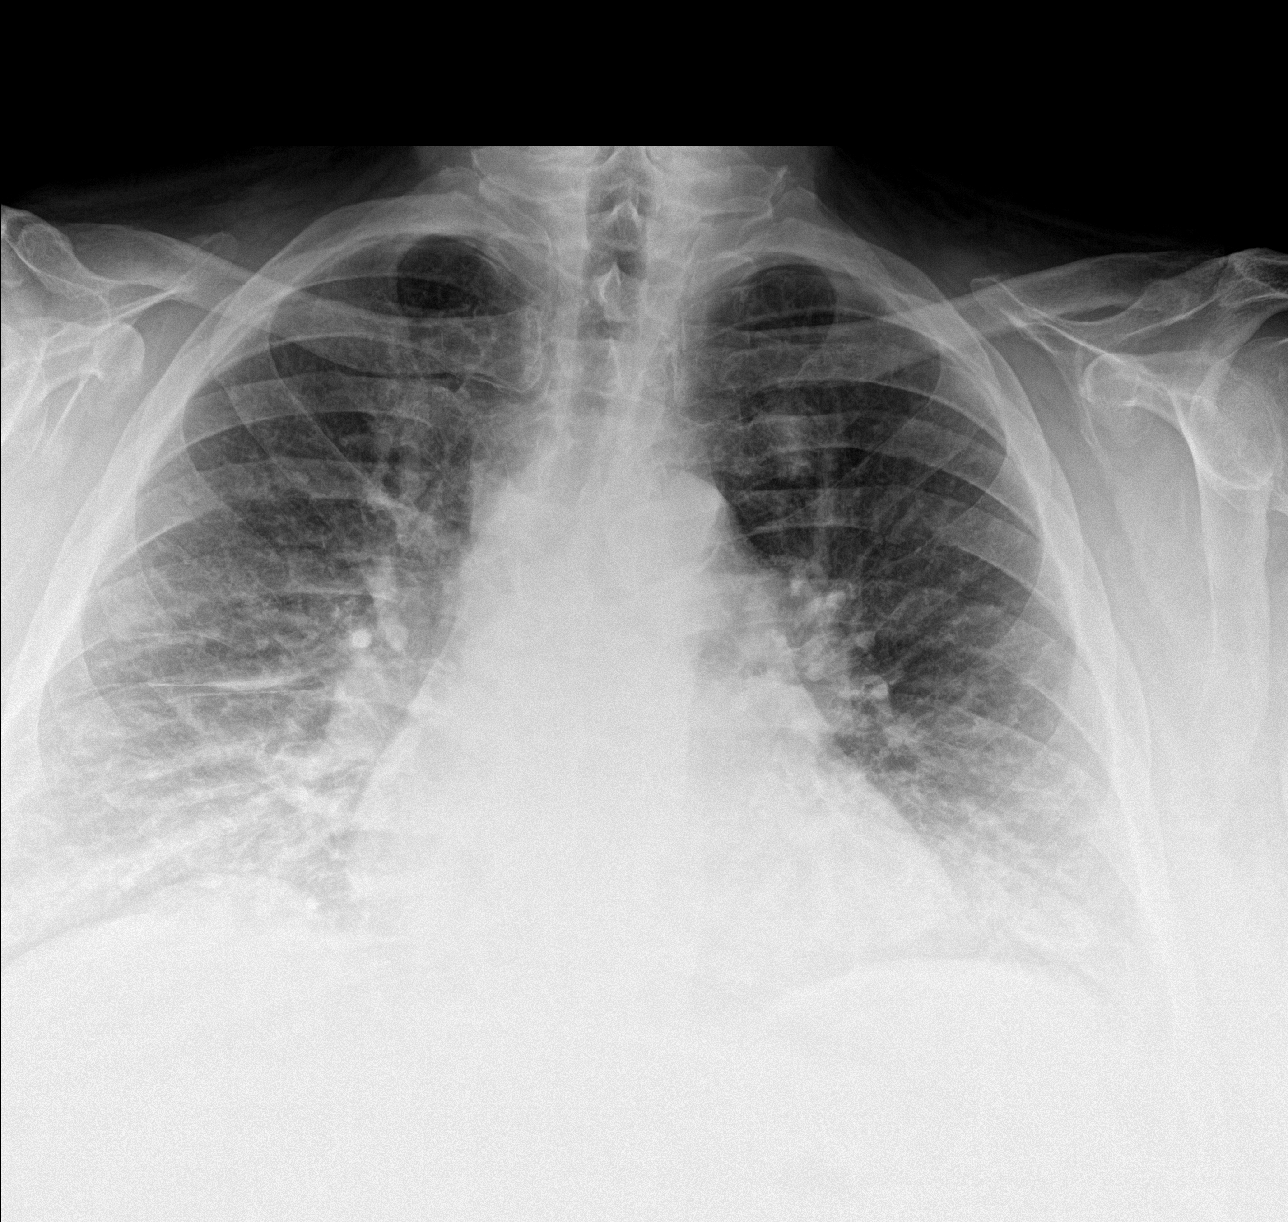

[chest lat]
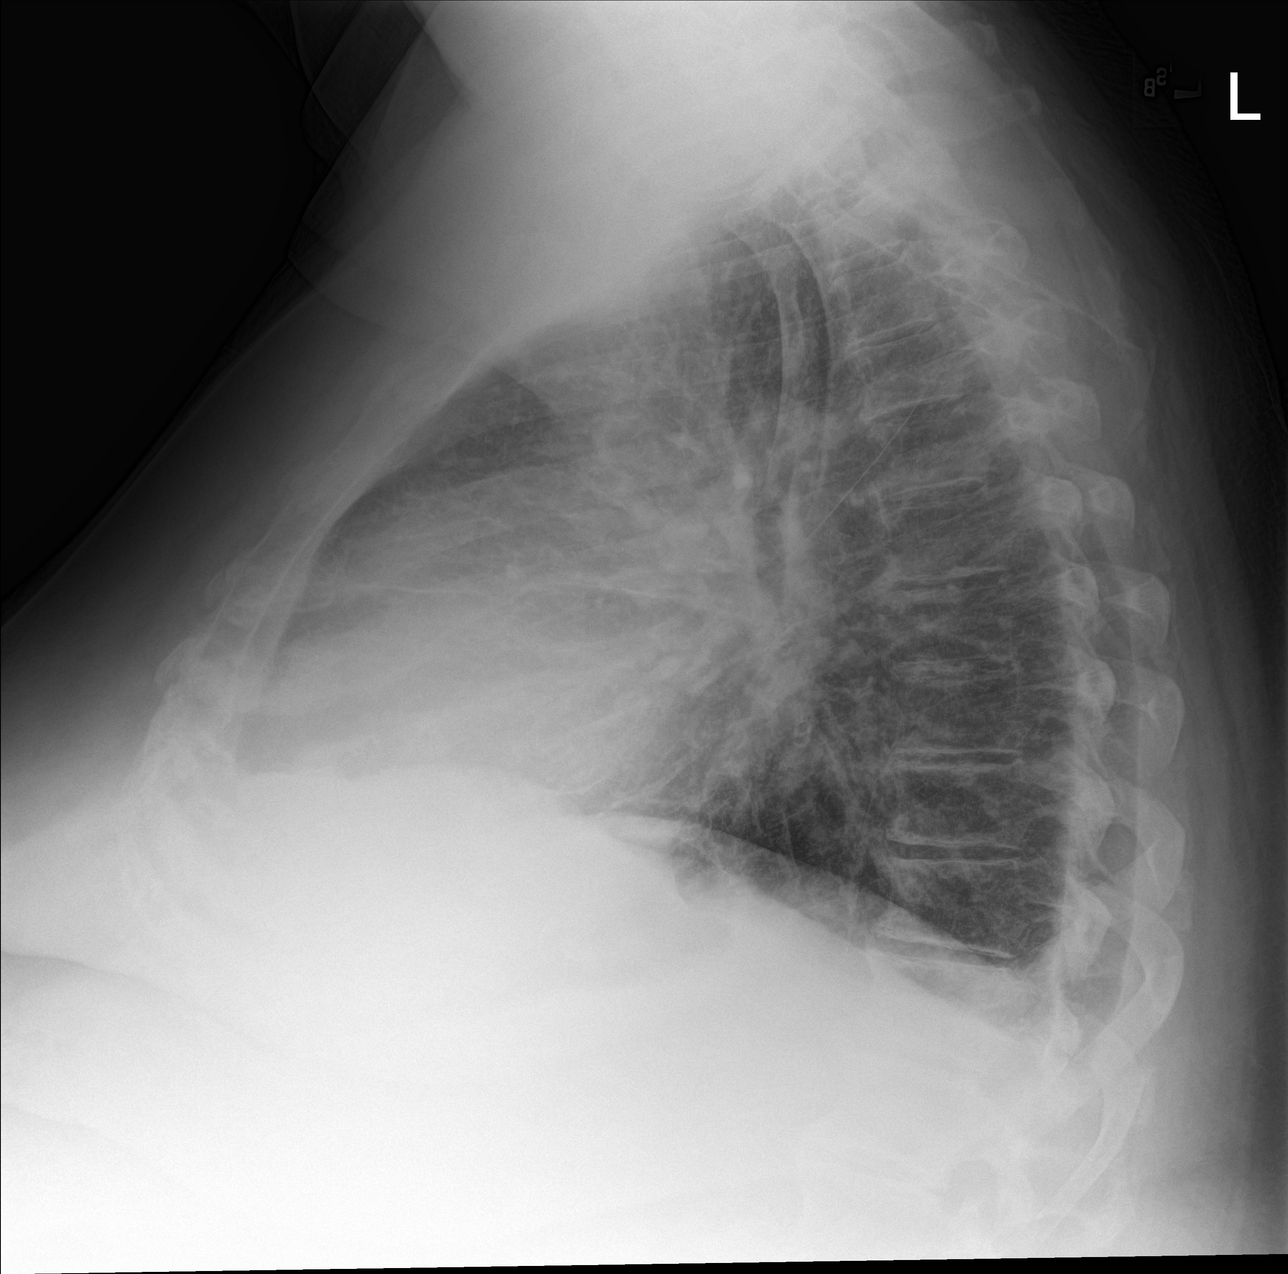

[3 of 3 positions shown; findings below may reference images not displayed]

FINDINGS: The lungs are adequately inflated. The pulmonary interstitial
markings are increased. The pulmonary vascularity is engorged. The
cardiac silhouette is enlarged. There is a trace of pleural fluid in
the minor fissure. No significant pleural effusion is noted
elsewhere. The mediastinum is normal in width. The bony thorax
exhibits no acute abnormality.
IMPRESSION: Findings worrisome for congestive heart failure with pulmonary
interstitial edema. There is no definite pneumonia. Correlation with
the patient's clinical and laboratory values is needed.

## 2017-08-03 ENCOUNTER — Other Ambulatory Visit: Payer: Self-pay | Admitting: Family Medicine

## 2017-08-04 ENCOUNTER — Encounter (HOSPITAL_BASED_OUTPATIENT_CLINIC_OR_DEPARTMENT_OTHER): Payer: 59 | Attending: Internal Medicine

## 2017-08-04 ENCOUNTER — Other Ambulatory Visit: Payer: Self-pay | Admitting: *Deleted

## 2017-08-04 DIAGNOSIS — L03116 Cellulitis of left lower limb: Secondary | ICD-10-CM | POA: Insufficient documentation

## 2017-08-04 DIAGNOSIS — I10 Essential (primary) hypertension: Secondary | ICD-10-CM | POA: Insufficient documentation

## 2017-08-04 DIAGNOSIS — L97222 Non-pressure chronic ulcer of left calf with fat layer exposed: Secondary | ICD-10-CM | POA: Insufficient documentation

## 2017-08-04 DIAGNOSIS — L97829 Non-pressure chronic ulcer of other part of left lower leg with unspecified severity: Secondary | ICD-10-CM | POA: Diagnosis not present

## 2017-08-04 DIAGNOSIS — I890 Lymphedema, not elsewhere classified: Secondary | ICD-10-CM | POA: Diagnosis not present

## 2017-08-04 DIAGNOSIS — E11622 Type 2 diabetes mellitus with other skin ulcer: Secondary | ICD-10-CM | POA: Diagnosis not present

## 2017-08-04 DIAGNOSIS — E1151 Type 2 diabetes mellitus with diabetic peripheral angiopathy without gangrene: Secondary | ICD-10-CM | POA: Diagnosis not present

## 2017-08-04 DIAGNOSIS — I87333 Chronic venous hypertension (idiopathic) 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: Secondary | ICD-10-CM | POA: Insufficient documentation

## 2017-08-04 DIAGNOSIS — E1140 Type 2 diabetes mellitus with diabetic neuropathy, unspecified: Secondary | ICD-10-CM | POA: Insufficient documentation

## 2017-08-04 DIAGNOSIS — L97812 Non-pressure chronic ulcer of other part of right lower leg with fat layer exposed: Secondary | ICD-10-CM | POA: Diagnosis not present

## 2017-08-04 MED ORDER — BUTALBITAL-APAP-CAFF-COD 50-325-40-30 MG PO CAPS: ORAL_CAPSULE | ORAL | 1 refills | Status: DC

## 2017-08-04 NOTE — Telephone Encounter (Signed)
[employer].  RF request for buta/apap/caf LOV: 06/23/17 Next ov: 09/22/17 Last written: 11/27/16 #90 w/ 1RF  Please advise. Thanks.

## 2017-08-04 NOTE — Telephone Encounter (Signed)
Rx faxed

## 2017-08-06 ENCOUNTER — Other Ambulatory Visit: Payer: Self-pay | Admitting: Family Medicine

## 2017-08-11 DIAGNOSIS — I87333 Chronic venous hypertension (idiopathic) 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: Secondary | ICD-10-CM | POA: Diagnosis not present

## 2017-08-18 DIAGNOSIS — I87333 Chronic venous hypertension (idiopathic) 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: Secondary | ICD-10-CM | POA: Diagnosis not present

## 2017-08-22 DIAGNOSIS — I87333 Chronic venous hypertension (idiopathic) 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: Secondary | ICD-10-CM | POA: Diagnosis not present

## 2017-09-01 ENCOUNTER — Other Ambulatory Visit: Payer: Self-pay | Admitting: Family Medicine

## 2017-09-01 ENCOUNTER — Ambulatory Visit: Payer: 59 | Admitting: Infectious Diseases

## 2017-09-01 DIAGNOSIS — I87333 Chronic venous hypertension (idiopathic) 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: Secondary | ICD-10-CM | POA: Diagnosis not present

## 2017-09-08 ENCOUNTER — Encounter (HOSPITAL_BASED_OUTPATIENT_CLINIC_OR_DEPARTMENT_OTHER): Payer: 59 | Attending: Internal Medicine

## 2017-09-08 DIAGNOSIS — E1151 Type 2 diabetes mellitus with diabetic peripheral angiopathy without gangrene: Secondary | ICD-10-CM | POA: Insufficient documentation

## 2017-09-08 DIAGNOSIS — I890 Lymphedema, not elsewhere classified: Secondary | ICD-10-CM | POA: Diagnosis not present

## 2017-09-08 DIAGNOSIS — L97829 Non-pressure chronic ulcer of other part of left lower leg with unspecified severity: Secondary | ICD-10-CM | POA: Diagnosis not present

## 2017-09-08 DIAGNOSIS — L97819 Non-pressure chronic ulcer of other part of right lower leg with unspecified severity: Secondary | ICD-10-CM | POA: Insufficient documentation

## 2017-09-08 DIAGNOSIS — L97221 Non-pressure chronic ulcer of left calf limited to breakdown of skin: Secondary | ICD-10-CM | POA: Diagnosis not present

## 2017-09-08 DIAGNOSIS — E1140 Type 2 diabetes mellitus with diabetic neuropathy, unspecified: Secondary | ICD-10-CM | POA: Insufficient documentation

## 2017-09-08 DIAGNOSIS — E11622 Type 2 diabetes mellitus with other skin ulcer: Secondary | ICD-10-CM | POA: Insufficient documentation

## 2017-09-08 DIAGNOSIS — L03116 Cellulitis of left lower limb: Secondary | ICD-10-CM | POA: Insufficient documentation

## 2017-09-08 DIAGNOSIS — L97812 Non-pressure chronic ulcer of other part of right lower leg with fat layer exposed: Secondary | ICD-10-CM | POA: Insufficient documentation

## 2017-09-08 DIAGNOSIS — I10 Essential (primary) hypertension: Secondary | ICD-10-CM | POA: Diagnosis not present

## 2017-09-08 DIAGNOSIS — I87333 Chronic venous hypertension (idiopathic) 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: Secondary | ICD-10-CM | POA: Insufficient documentation

## 2017-09-11 DIAGNOSIS — I87333 Chronic venous hypertension (idiopathic) 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: Secondary | ICD-10-CM | POA: Diagnosis not present

## 2017-09-15 ENCOUNTER — Encounter: Payer: Self-pay | Admitting: Infectious Diseases

## 2017-09-15 ENCOUNTER — Ambulatory Visit (INDEPENDENT_AMBULATORY_CARE_PROVIDER_SITE_OTHER): Payer: 59 | Admitting: Infectious Diseases

## 2017-09-15 DIAGNOSIS — R600 Localized edema: Secondary | ICD-10-CM

## 2017-09-15 DIAGNOSIS — L03119 Cellulitis of unspecified part of limb: Secondary | ICD-10-CM

## 2017-09-15 DIAGNOSIS — I87333 Chronic venous hypertension (idiopathic) 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: Secondary | ICD-10-CM | POA: Diagnosis not present

## 2017-09-15 DIAGNOSIS — Z23 Encounter for immunization: Secondary | ICD-10-CM | POA: Diagnosis not present

## 2017-09-15 DIAGNOSIS — Z0000 Encounter for general adult medical examination without abnormal findings: Secondary | ICD-10-CM

## 2017-09-15 NOTE — Patient Instructions (Addendum)
Lymphedema Management Clinics - ask your primary care team to place a referral for you to the below locations:  [facility] - [phone]  [place] [facility]) [phone]- [phone]  [place] [facility] [phone]- [phone]  Stop taking your Amoxicillin for now - will discuss with Dr. Leanord Hawkingobson further about other options to help with suppression. We could consider doing doxycycline daily for you to see if this helps since it is most effective for you as a treatment.   Will call you with a plan for follow up after further discussion with our team and Dr. Leanord Hawkingobson.

## 2017-09-15 NOTE — Progress Notes (Signed)
[name] 1950-11-20 [id number]   PCP: [name], MD  Referring Provider: Dr. Leanord Hawking [facility])  Reason for Visit: FU Recurrent cellulitis   Brief Narrative:  [name] is a 66 y.o. male that was referred for evaluation of his recurrent cellulitis of his lower extremities. He has been followed closely by the [facility] since March 2017. He has a long standing history of B/L venous stasis with ulcerations, chronic lower extremity swelling / lymphadenopathy and weeping. Significant pmhx includes T2DM with insulin requirement, morbid obesity, diastolic CHF and significant swelling of his lower extremities / lymphedema. Last HgbA1C 6.4% in May 2018. ABI in April 2017 did not require intervention and LE venous dopplers 10/2016 without deep or superficial thrombus.    HPI:.  [name] feels well overall today. Currently he is finishing up a 7 day course of Doxycycline for another flare of his cellulitis after he experienced worsening pain, redness and swelling behind his left leg/ankle. He has not noticed any fevers, changes in appetite or malaise. This recent episode started after he noticed increased drainage after using lymphedema pumps and attributes some of the recurrence in infection to these. Describes the drainage to have been thin yellow and malodorous. He is still getting wraps 1-2 times a week at the wound care center. He has been compliant with the Amoxicillin 500 mg BID. He was previously unable to tolerate suppressive clindamycin. He does not report any improvement in suppression of symptoms or frequency.   He tells me he retires at the end of the year and wants to get into [employer]. Asking about lymphedema exercises. No side effects from the medication from what he has noticed and specifically no diarrhea.   Patient Active Problem List   Diagnosis Date Noted  . Chronic diastolic heart failure (HCC) 12/24/2016  . Peripheral  edema 02/22/2016  . Acute diastolic congestive heart failure (HCC) 10/27/2015  . Dyspnea 10/24/2015  . Wheezing 10/24/2015  . Acute bronchitis 10/24/2015  . 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  . Chronic headaches 05/10/2015  . Venous stasis of both lower extremities 02/14/2015  . Equivocal stress test 10/24/2014  . Atrial fibrillation (HCC) 08/15/2014  . Healthcare maintenance 08/15/2014  . Type 2 diabetes mellitus with diabetic neuropathy (HCC) 08/15/2014  . 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 Recurrent cellulitis of lower extremity 08/02/2013  . Dysuria 08/02/2013  . Edema of both legs 11/04/2008  . Morbid obesity due to excess calories (HCC) 12/14/2007  . Venous (peripheral) insufficiency 07/22/2007  . Diabetic renal disease (HCC) 04/20/2007  . DM2 (diabetes mellitus, type 2) (HCC) 08/11/2006  . Hypertension, essential 08/11/2006  . ALLERGIC RHINITIS 08/11/2006  . ASTHMA 08/11/2006    Review of Systems  Constitutional: Negative for chills, fever, malaise/fatigue and weight loss.  HENT: Negative for sore throat.   Respiratory: Negative for cough and sputum production.   Cardiovascular: Positive for leg swelling. Negative for chest pain, palpitations and claudication.  Gastrointestinal: Negative for abdominal pain, diarrhea and vomiting.  Genitourinary: Negative for dysuria and flank pain.  Musculoskeletal: Positive for joint pain.  Skin: Negative for rash.  Neurological: Negative for dizziness and headaches.    Past Medical History:  Diagnosis Date  . ALLERGIC RHINITIS 08/11/2006  . ASTHMA 08/11/2006  . Chronic atrial fibrillation (HCC) 08/2014  . Chronic constipation   . Chronic renal insufficiency, stage 2 (mild)    Borderline stage III (GFR 60s).  . DIABETES MELLITUS, TYPE II 08/11/2006  .  Diastolic CHF (HCC)   . DM W/RENAL MANIFESTATIONS, TYPE II 04/20/2007  . HYPERTENSION 08/11/2006  . OBESITY, MORBID 12/14/2007  . Recurrent cellulitis of lower leg  2017-18   ID consult 06/2017--they recommended he start daily clindamycin suppression (hx of MRSA) but this caused diarrhea.  At f/u 92018 ID started him on amoxil prophylaxis.  . Venous stasis ulcers of both lower extremities (HCC)    wound clinic care ongoing as of 06/2017   Outpatient Medications Prior to Visit  Medication Sig Dispense Refill  . albuterol (PROVENTIL HFA;VENTOLIN HFA) 108 (90 Base) MCG/ACT inhaler Inhale 2 puffs into the lungs every 6 (six) hours as needed for wheezing or shortness of breath. 1 Inhaler 1  . b complex vitamins capsule Take 1 capsule by mouth every morning.    . butalbital-acetaminophen-caffeine (FIORICET WITH CODEINE) 50-325-40-30 MG capsule 1-2 caps po q6h prn headache 90 capsule 1  . cetirizine (ZYRTEC) 10 MG tablet Take 10 mg by mouth daily.    . Cinnamon 500 MG TABS Take by mouth. Reported on 02/19/2016    . clotrimazole (LOTRIMIN) 1 % cream Apply 1 application topically 2 (two) times daily. To toe nails and bottom of your left foot. 30 g 3  . Coenzyme Q10 (CO Q 10) 10 MG CAPS Take by mouth. Reported on 02/19/2016    . diltiazem (TIAZAC) 360 MG 24 hr capsule TAKE 1 CAPSULE BY MOUTH  DAILY AS DIRECTED  EQUIVALENT TO TIAZAC 90 capsule 1  . furosemide (LASIX) 40 MG tablet 2 tabs po qAM and 1 tab po q afternoon 270 tablet 1  . HUMALOG MIX 75/25 KWIKPEN (75-25) 100 UNIT/ML Kwikpen INJECT SUBCUTANEOUSLY 25  UNITS EVERY MORNING AND 20  UNITS EVERY EVENING AT  SUPPER 45 mL 1  . Insulin Pen Needle (BD ULTRA-FINE PEN NEEDLES) 29G X 12.7MM MISC 1 each by Other route 2 (two) times daily. 200 each 2  . Insulin Pen Needle (BD ULTRA-FINE PEN NEEDLES) 29G X 12.7MM MISC USE TWICE DAILY 200 each 3  . Insulin Pen Needle (RELION PEN NEEDLE 31G/8MM) 31G X 8 MM MISC 1 each by Does not apply route 2 (two) times daily.    Marland Kitchen. lisinopril (PRINIVIL,ZESTRIL) 40 MG tablet TAKE 1 TABLET BY MOUTH  EVERY DAY 90 tablet 1  . metFORMIN (GLUCOPHAGE) 1000 MG tablet TAKE 1 TABLET BY MOUTH  TWICE A  DAY WITH MEALS 180 tablet 1  . metoprolol tartrate (LOPRESSOR) 50 MG tablet TAKE 1 AND 1/2 TABLETS BY  MOUTH TWO TIMES DAILY 270 tablet 1  . Multiple Vitamin (MULTIVITAMIN) tablet Take 1 tablet by mouth daily.    . Nutritional Supplements (GRAPESEED EXTRACT PO) Take 1 capsule by mouth daily.    . ONE TOUCH ULTRA TEST test strip TEST BLOOD SUGAR TWO TIMES  DAILY 200 each 1  . saw palmetto 500 MG capsule Take 500 mg by mouth daily.    Marland Kitchen. terazosin (HYTRIN) 10 MG capsule TAKE 1 CAPSULE BY MOUTH  ONCE A DAY AT BEDTIME 90 capsule 1  . vitamin E 400 UNIT capsule Take 400 Units by mouth every morning. Selenium 50mg     . XARELTO 20 MG TABS tablet TAKE ONE TABLET BY MOUTH ONCE DAILY WITH  SUPPER 90 tablet 1  . amoxicillin (AMOXIL) 500 MG capsule Take 1 capsule (500 mg total) by mouth 2 (two) times daily. 60 capsule 3   No facility-administered medications prior to visit.    No Known Allergies  Physical Exam and Objective Findings: Vitals:  09/15/17 0951  Weight: (!) 393 lb (178.3 kg)   Body mass index is 59.76 kg/m.  Physical Exam  Constitutional: He is oriented to person, place, and time.  Non-toxic appearance.  Pleasant, obese gentleman with bilateral legs wrapped with unna boots.  Eyes: No scleral icterus.  Neck: Neck supple. No JVD present.  Cardiovascular: Normal rate, regular rhythm and intact distal pulses.  No murmur heard. Pulmonary/Chest: Effort normal and breath sounds normal. He has no wheezes. He has no rales.  Abdominal: Soft. He exhibits no distension. There is no tenderness.  Musculoskeletal: He exhibits edema. He exhibits no tenderness.  Ambulates with cane assistance  Neurological: He is alert and oriented to person, place, and time.  Skin: Skin is warm and dry.  Psychiatric: Affect normal.  Left Lower Extremity posterior/medial wound - Unna boot and outer sleeve clean and dry today. He is going to see wound care team in a little while. Large 3+ swelling and chronic  lymphedema changes to inner thighs above compression dressing. No streaking or redness noted.  RLE - Radio broadcast assistantUnna boot in place. No redness noted. 3+ edema above dressing. Not removed today.    ASSESSMENT & PLAN:  Problem List Items Addressed This Visit      Other   Edema of both legs    I have counseled on role of PT and lymphedema clinics. Provided him with [facility] affiliated sites so he can discuss with his PCP as he may need referral from him pending his insurance.       Recurrent cellulitis of lower extremity    Ongoing problem. He was unable to tolerate suppressive clindamycin and seems the amox prophylaxis has not impacted his relapse frequency.   I have provided him with some Lymphedema clinics for him to consider that may offer him benefit regarding prevention.   I question if the dose of amox PO was not enough for his size, however he has a fair pill burden as is. I would like to try Mr. Payson on suppressive IM injections of benzathine pcn 1.2 million units q4weeks to see if this helps. Discussed q3weeks however he is uncertain he can commit to this regarding being able to easily get to our clinic. He sees his PCP next week - will send staff message to see if we can arrange for injections to be given at PCP office as he reports it is much more convenient and easier for him to walk to.   May need to consider abortive therapy although I am not certain he feels much regarding cellulitis progression before it hits him hard. May continue Doxycycline for chronic suppression, although being cellulitis is usually related to strep pcn/amox should be a better choice with fewer side effects.       Healthcare maintenance    Flu shot administered today.        Other Visit Diagnoses    Need for immunization against influenza       Relevant Orders   Flu Vaccine QUAD 36+ mos IM (Completed)     No orders of the defined types were placed in this encounter.  Will d/w PCP about pcn injections to be  administered there - appt pending for now until we determine where he will receive injections. Would like to try this for 3 months to see effect on frequency.   Rexene AlbertsStephanie Estil Vallee, MSN, [facility]  09/18/2017  16:55 PM

## 2017-09-15 NOTE — Assessment & Plan Note (Addendum)
Ongoing problem. He was unable to tolerate suppressive clindamycin and seems the amox prophylaxis has not impacted his relapse frequency.   I have provided him with some Lymphedema clinics for him to consider that may offer him benefit regarding prevention.   I question if the dose of amox PO was not enough for his size, however he has a fair pill burden as is. I would like to try Mr. Calais on suppressive IM injections of benzathine pcn 1.2 million units q4weeks to see if this helps. Discussed q3weeks however he is uncertain he can commit to this regarding being able to easily get to our clinic. He sees his PCP next week - will send staff message to see if we can arrange for injections to be given at PCP office as he reports it is much more convenient and easier for him to walk to.   May need to consider abortive therapy although I am not certain he feels much regarding cellulitis progression before it hits him hard. May continue Doxycycline for chronic suppression, although being cellulitis is usually related to strep pcn/amox should be a better choice with fewer side effects.

## 2017-09-17 ENCOUNTER — Telehealth: Payer: Self-pay | Admitting: Infectious Diseases

## 2017-09-17 NOTE — Telephone Encounter (Signed)
Attempted to call [name] with options for treatment plan going forward with his recurrent cellulitis. LVM and requested call back.   Would like to start Q3w IM benzathine penicillin 1.2 million units to see if this offers better prevention.  Will try back tomorrow.   Rexene AlbertsStephanie Brylen Wagar, NP

## 2017-09-18 NOTE — Assessment & Plan Note (Signed)
I have counseled on role of PT and lymphedema clinics. Provided him with [facility] affiliated sites so he can discuss with his PCP as he may need referral from him pending his insurance.

## 2017-09-18 NOTE — Assessment & Plan Note (Signed)
Flu shot administered today. 

## 2017-09-22 ENCOUNTER — Ambulatory Visit: Payer: 59 | Admitting: Family Medicine

## 2017-09-22 ENCOUNTER — Other Ambulatory Visit: Payer: Self-pay

## 2017-09-22 ENCOUNTER — Encounter: Payer: Self-pay | Admitting: Family Medicine

## 2017-09-22 VITALS — BP 138/70 | HR 62 | Temp 97.5°F | Resp 16 | Ht 68.0 in | Wt 392.2 lb

## 2017-09-22 DIAGNOSIS — Z794 Long term (current) use of insulin: Secondary | ICD-10-CM | POA: Diagnosis not present

## 2017-09-22 DIAGNOSIS — G2581 Restless legs syndrome: Secondary | ICD-10-CM | POA: Diagnosis not present

## 2017-09-22 DIAGNOSIS — N182 Chronic kidney disease, stage 2 (mild): Secondary | ICD-10-CM | POA: Diagnosis not present

## 2017-09-22 DIAGNOSIS — I10 Essential (primary) hypertension: Secondary | ICD-10-CM | POA: Diagnosis not present

## 2017-09-22 DIAGNOSIS — E118 Type 2 diabetes mellitus with unspecified complications: Secondary | ICD-10-CM

## 2017-09-22 DIAGNOSIS — I87333 Chronic venous hypertension (idiopathic) 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: Secondary | ICD-10-CM | POA: Diagnosis not present

## 2017-09-22 LAB — BASIC METABOLIC PANEL
BUN: 18 mg/dL (ref 6–23)
CALCIUM: 9 mg/dL (ref 8.4–10.5)
CHLORIDE: 101 meq/L (ref 96–112)
CO2: 31 mEq/L (ref 19–32)
CREATININE: 1.16 mg/dL (ref 0.40–1.50)
GFR: 66.86 mL/min (ref >60.00)
Glucose, Bld: 111 mg/dL — ABNORMAL HIGH (ref 70–99)
Potassium: 4.4 mEq/L (ref 3.5–5.1)
Sodium: 140 mEq/L (ref 135–145)

## 2017-09-22 LAB — HEMOGLOBIN A1C: HEMOGLOBIN A1C: 6.4 % (ref 4.6–6.5)

## 2017-09-22 MED ORDER — CLONAZEPAM 1 MG PO TABS: 1.0000 mg | ORAL_TABLET | Freq: Every day | ORAL | 2 refills | Status: DC

## 2017-09-22 NOTE — Patient Instructions (Signed)
STOP your aspirin. Start clonazepam 1mg  every evening to help with restless legs and sleep.

## 2017-09-22 NOTE — Progress Notes (Signed)
OFFICE VISIT  09/22/2017   CC:  Chief Complaint  Patient presents with  . Follow-up    [name], pt is fasting.    HPI:    Patient is a 66 y.o. Caucasian male who presents for 3 mo f/u DM 2, HTN, CRI with GFR in the 60s. He is being managed by the wound clinic for severe LE venous stasis edema with hx of ulcers and recurrent cellulitis. We are in the process of arranging monthly penicillin injection here at the request of his wound clinic provider.  HTN: no home measurements.  Has not taken bp meds yet today.  DM 2: 150-170 fasting.  Evenings: 2H PP supper 150-180.  Occ up into 290s.  CRI: hydrates well.  Takes 325mg  ASA, o/w no NSAIDs.  Chronic a-fib: on xarelto but also taking 325mg  ASA qhs.  Says the aspirin helps signif at night with sensation of restless legs and helps him sleep but does not sedate him. He has no hx of CAD or CVA or PAD.  ROS: no HAs, no dizziness, no CP, no SOB, no palpitations, no fevers.  Past Medical History:  Diagnosis Date  . ALLERGIC RHINITIS 08/11/2006  . ASTHMA 08/11/2006  . Chronic atrial fibrillation (HCC) 08/2014  . Chronic constipation   . Chronic renal insufficiency, stage 2 (mild)    Borderline stage III (GFR 60s).  . DIABETES MELLITUS, TYPE II 08/11/2006  . Diastolic CHF (HCC)   . DM W/RENAL MANIFESTATIONS, TYPE II 04/20/2007  . HYPERTENSION 08/11/2006  . OBESITY, MORBID 12/14/2007  . 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  At f/u 92018 ID started him on amoxil prophylaxis.  . Venous stasis ulcers of both lower extremities (HCC)    wound clinic care ongoing as of 06/2017    Past Surgical History:  Procedure Laterality Date  . TRANSTHORACIC ECHOCARDIOGRAM  11/2007; 09/2014; 11/2015   LV fxn normal, EF normal, mild dilation of left atrium.  2015 grade II diast dysfxn.  2017 EF 55-60%.  Marland Kitchen URETERAL STENT PLACEMENT    . virtual  colonoscopy  01/2011   Normal    Outpatient Medications Prior to Visit  Medication Sig Dispense Refill  . b complex vitamins capsule Take 1 capsule by mouth every morning.    . butalbital-acetaminophen-caffeine (FIORICET WITH CODEINE) 50-325-40-30 MG capsule 1-2 caps po q6h prn headache 90 capsule 1  . cetirizine (ZYRTEC) 10 MG tablet Take 10 mg by mouth daily.    . Cinnamon 500 MG TABS Take by mouth. Reported on 02/19/2016    . clotrimazole (LOTRIMIN) 1 % cream Apply 1 application topically 2 (two) times daily. To toe nails and bottom of your left foot. 30 g 3  . Coenzyme Q10 (CO Q 10) 10 MG CAPS Take by mouth. Reported on 02/19/2016    . diltiazem (TIAZAC) 360 MG 24 hr capsule TAKE 1 CAPSULE BY MOUTH  DAILY AS DIRECTED  EQUIVALENT TO TIAZAC 90 capsule 1  . furosemide (LASIX) 40 MG tablet 2 tabs po qAM and 1 tab po q afternoon 270 tablet 1  . HUMALOG MIX 75/25 KWIKPEN (75-25) 100 UNIT/ML Kwikpen INJECT SUBCUTANEOUSLY 25  UNITS EVERY MORNING AND 20  UNITS EVERY EVENING AT  SUPPER 45 mL 1  . Insulin Pen Needle (BD ULTRA-FINE PEN NEEDLES) 29G X 12.7MM MISC 1 each by Other route 2 (two) times daily. 200 each 2  . Insulin Pen  Needle (BD ULTRA-FINE PEN NEEDLES) 29G X 12.7MM MISC USE TWICE DAILY 200 each 3  . Insulin Pen Needle (RELION PEN NEEDLE 31G/8MM) 31G X 8 MM MISC 1 each by Does not apply route 2 (two) times daily.    Marland Kitchen. lisinopril (PRINIVIL,ZESTRIL) 40 MG tablet TAKE 1 TABLET BY MOUTH  EVERY DAY 90 tablet 1  . metFORMIN (GLUCOPHAGE) 1000 MG tablet TAKE 1 TABLET BY MOUTH  TWICE A DAY WITH MEALS 180 tablet 1  . metoprolol tartrate (LOPRESSOR) 50 MG tablet TAKE 1 AND 1/2 TABLETS BY  MOUTH TWO TIMES DAILY 270 tablet 1  . Multiple Vitamin (MULTIVITAMIN) tablet Take 1 tablet by mouth daily.    . Nutritional Supplements (GRAPESEED EXTRACT PO) Take 1 capsule by mouth daily.    . ONE TOUCH ULTRA TEST test strip TEST BLOOD SUGAR TWO TIMES  DAILY 200 each 1  . saw palmetto 500 MG capsule Take 500 mg by  mouth daily.    Marland Kitchen. terazosin (HYTRIN) 10 MG capsule TAKE 1 CAPSULE BY MOUTH  ONCE A DAY AT BEDTIME 90 capsule 1  . vitamin E 400 UNIT capsule Take 400 Units by mouth every morning. Selenium 50mg     . XARELTO 20 MG TABS tablet TAKE ONE TABLET BY MOUTH ONCE DAILY WITH  SUPPER 90 tablet 1  . albuterol (PROVENTIL HFA;VENTOLIN HFA) 108 (90 Base) MCG/ACT inhaler Inhale 2 puffs into the lungs every 6 (six) hours as needed for wheezing or shortness of breath. (Patient not taking: Reported on 09/22/2017) 1 Inhaler 1   No facility-administered medications prior to visit.     No Known Allergies  ROS As per HPI  PE: Blood pressure 138/70, pulse 62, temperature (!) 97.5 F (36.4 C), temperature source Oral, resp. rate 16, height 5\' 8"  (1.727 m), weight (!) 392 lb 4 oz (177.9 kg), SpO2 93 %. Gen: Alert, well appearing, obese-appearing.  Patient is oriented to person, place, time, and situation. AFFECT: pleasant, lucid thought and speech. No further exam today.   LABS:  Lab Results  Component Value Date   TSH 2.99 12/09/2016   Lab Results  Component Value Date   WBC 7.5 12/09/2016   HGB 12.7 (L) 12/09/2016   HCT 38.6 (L) 12/09/2016   MCV 84.4 12/09/2016   PLT 263.0 12/09/2016  No results found for: IRON, TIBC, FERRITIN  Lab Results  Component Value Date   CREATININE 1.21 06/23/2017   BUN 17 06/23/2017   NA 140 06/23/2017   K 4.8 06/23/2017   CL 103 06/23/2017   CO2 30 06/23/2017   Lab Results  Component Value Date   ALT 14 12/09/2016   AST 15 12/09/2016   ALKPHOS 72 12/09/2016   BILITOT 1.1 12/09/2016   Lab Results  Component Value Date   CHOL 94 12/09/2016   Lab Results  Component Value Date   HDL 33.90 (L) 12/09/2016   Lab Results  Component Value Date   LDLCALC 51 12/09/2016   Lab Results  Component Value Date   TRIG 45.0 12/09/2016   Lab Results  Component Value Date   CHOLHDL 3 12/09/2016   Lab Results  Component Value Date   PSA 0.30 12/09/2016   PSA  0.21 12/11/2015   PSA 0.37 08/15/2014   Lab Results  Component Value Date   HGBA1C 6.7 (H) 06/23/2017    IMPRESSION AND PLAN:  1) DM 2, historically well controlled. No home glucose monitoring. HbA1c today as well as BMET.  2) HTN: elevated bp today likely  due to pt not taking his meds today. BMET today. No med changes made today.  3) CRI, GFR 60s: adequate hydration.  I recommended he stop his ASA 325mg  tab that he is taking daily: he is already on xarelto for a-fib.  He has no known CAD, cerebrovascular dz, or peripheral arterial disease.  4) Restless legs syndrome: he states that taking his ASA 325mg  at bedtime helps these sx's and "helps me sleep". As stated in #3 above, d/c aspirin altogether.  Start clonazepam 1mg  qhs.  Therapeutic expectations and side effect profile of medication discussed today.  Patient's questions answered.  5) Chronic LE venous stasis edema w/chronic venous stasis ulcers and cellulitis. He has these dressed completely today.  Continue wound care center f/u as well as infectious disease f/u. Will continue to work on arranging for him to get bicillin LA IM q month here at our office.  An After Visit Summary was printed and given to the patient.  FOLLOW UP: Return in about 3 months (around 12/23/2017) for routine chronic illness f/u.  Signed:  Santiago BumpersPhil McGowen, MD           09/22/2017

## 2017-09-23 ENCOUNTER — Encounter: Payer: Self-pay | Admitting: Family Medicine

## 2017-09-23 ENCOUNTER — Telehealth: Payer: Self-pay

## 2017-09-23 NOTE — Telephone Encounter (Signed)
Patient called regarding test results. Patient acknowledged. Patient stated that he was not going to take the Klonopin due to the side effects that he read about on this medication.

## 2017-09-23 NOTE — Telephone Encounter (Signed)
OK 

## 2017-09-28 ENCOUNTER — Other Ambulatory Visit: Payer: Self-pay | Admitting: Family Medicine

## 2017-09-29 ENCOUNTER — Telehealth: Payer: Self-pay | Admitting: Family Medicine

## 2017-09-29 NOTE — Telephone Encounter (Signed)
Penicillin in office.  I made appt for patient 12.3.18 @ 10:30 am to get penicillin injections.  He is to receive two penicillin injections every month x 6 months per Dr Milinda CaveMcGowen.

## 2017-09-29 NOTE — Telephone Encounter (Signed)
----- Message from Jeoffrey MassedPhilip H McGowen, MD sent at 09/23/2017 12:51 PM EST ----- Regarding: RE: penicillin G When this comes in pls call pt and tell him he can schedule a nurse visit to get his first injection of this med. After he gets his first injection, he should stop any antibiotic pills he is currently taking.--thx ----- Message ----- From: Eulah PontAlbright, Telvin Reinders M, CMA Sent: 09/23/2017   9:04 AM To: Jeoffrey MassedPhilip H McGowen, MD Subject: RE: penicillin G                               This has been ordered.  It should arrive either Tomorrow 11/21 or Monday 11/26 Please let me know if I need to do anything else :)  ----- Message ----- From: Jeoffrey MassedMcGowen, [name] H, MD Sent: 09/19/2017  11:31 AM To: Eulah PontLisa M Leshea Jaggers, CMA Subject: RE: penicillin G                               Bicillin LA is good. With 600,000 Units per 1 ml, he would get 2 ml each dose (for a dose of 1.2 million units), so I think ordering 1 box of 10 would be sufficient for now.  Any idea how quick this would come in if you ordered it now? --thx ----- Message ----- From: Eulah PontAlbright, Cesily Cuoco M, CMA Sent: 09/18/2017   1:52 PM To: Jeoffrey MassedPhilip H McGowen, MD Subject: RE: penicillin G                               Hey,  I can order BICILLIN LA 600MU PFS 10X1ML.  Other than that, I can ask the pharmacist but can you verify dose of pennicilin?  She wrote 1.2 Million? Units?  Please advise.  ----- Message ----- From: Jeoffrey MassedMcGowen, [name] H, MD Sent: 09/18/2017  12:09 PM To: Eulah PontLisa M Neira Bentsen, CMA Subject: penicillin Nila NephewG                                   Dezmond Downie, can you check into ordering this IM penicillin listed in the note below (benzathine penicillin, also called penicillin G) for our office so we can give it to Mr. Kugler q 4wks for 6 months? Let me know-thx. Phil. ----- Message ----- From: Blanchard Kelchixon, Stephanie N, NP Sent: 09/17/2017   5:15 PM To: Jeoffrey MassedPhilip H McGowen, MD  Good evening Dr. Milinda CaveMcGowen,   I have been trying to treat Mr. Cashman over the last few  months with suppressive antibiotics for his recurrent cellulitis. Unfortunately I have not been able to make an impact as of yet with the regimens we have tried so far.   I would like to try treating him with 1.2 million units benzathine pcn IM injections Q4weeks however in speaking to him about this he is concerned about the parking at our center and his ability to walk to our clinic that frequently. I was wondering if this would be something he could possibly do at your clinic as it is easier for him to get to your front door than it is here. I of course will see him in follow up to monitor effect and handle any as needed issues that arise with his cellulitis in conjunction with his wound care team.  Thank you for considering this for our very nice gentleman. He is a pleasure to work with and I would like to help him.   Regards,  Rexene AlbertsStephanie Dixon, NP

## 2017-10-03 DIAGNOSIS — I87333 Chronic venous hypertension (idiopathic) 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: Secondary | ICD-10-CM | POA: Diagnosis not present

## 2017-10-06 ENCOUNTER — Ambulatory Visit (INDEPENDENT_AMBULATORY_CARE_PROVIDER_SITE_OTHER): Payer: 59

## 2017-10-06 ENCOUNTER — Encounter (HOSPITAL_BASED_OUTPATIENT_CLINIC_OR_DEPARTMENT_OTHER): Payer: 59 | Attending: Internal Medicine

## 2017-10-06 DIAGNOSIS — L97222 Non-pressure chronic ulcer of left calf with fat layer exposed: Secondary | ICD-10-CM | POA: Diagnosis not present

## 2017-10-06 DIAGNOSIS — E1140 Type 2 diabetes mellitus with diabetic neuropathy, unspecified: Secondary | ICD-10-CM | POA: Diagnosis not present

## 2017-10-06 DIAGNOSIS — L97522 Non-pressure chronic ulcer of other part of left foot with fat layer exposed: Secondary | ICD-10-CM | POA: Diagnosis not present

## 2017-10-06 DIAGNOSIS — L97822 Non-pressure chronic ulcer of other part of left lower leg with fat layer exposed: Secondary | ICD-10-CM | POA: Insufficient documentation

## 2017-10-06 DIAGNOSIS — E1151 Type 2 diabetes mellitus with diabetic peripheral angiopathy without gangrene: Secondary | ICD-10-CM | POA: Insufficient documentation

## 2017-10-06 DIAGNOSIS — I87333 Chronic venous hypertension (idiopathic) 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: Secondary | ICD-10-CM | POA: Insufficient documentation

## 2017-10-06 DIAGNOSIS — I890 Lymphedema, not elsewhere classified: Secondary | ICD-10-CM | POA: Insufficient documentation

## 2017-10-06 DIAGNOSIS — L97819 Non-pressure chronic ulcer of other part of right lower leg with unspecified severity: Secondary | ICD-10-CM | POA: Diagnosis not present

## 2017-10-06 DIAGNOSIS — E11622 Type 2 diabetes mellitus with other skin ulcer: Secondary | ICD-10-CM | POA: Diagnosis not present

## 2017-10-06 DIAGNOSIS — I10 Essential (primary) hypertension: Secondary | ICD-10-CM | POA: Diagnosis not present

## 2017-10-06 DIAGNOSIS — L03119 Cellulitis of unspecified part of limb: Secondary | ICD-10-CM | POA: Diagnosis not present

## 2017-10-06 MED ORDER — PENICILLIN G BENZATHINE 1200000 UNIT/2ML IM SUSP
600000.0000 [IU] | Freq: Once | INTRAMUSCULAR | Status: AC
  Administered 2017-10-06: 600000 [IU] via INTRAMUSCULAR

## 2017-10-06 NOTE — Progress Notes (Signed)
Pt with recurrent LE cellulitis. As per ID clinic recommendations, will start giving benzathine penicillin 1.2 million units once per month. Agree with today's penicillin injection.  Signed:  Santiago BumpersPhil McGowen, MD           10/06/2017

## 2017-10-06 NOTE — Progress Notes (Signed)
Patient presents today for Bicillin injection x 2. Given with no incidence or problems. Patient tolerated well, sat for 10 minutes with no complaints and left stating he felt fine.

## 2017-10-09 ENCOUNTER — Other Ambulatory Visit: Payer: Self-pay | Admitting: Family Medicine

## 2017-10-14 DIAGNOSIS — I87333 Chronic venous hypertension (idiopathic) 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: Secondary | ICD-10-CM | POA: Diagnosis not present

## 2017-10-18 ENCOUNTER — Other Ambulatory Visit: Payer: Self-pay | Admitting: Family Medicine

## 2017-10-20 DIAGNOSIS — I87333 Chronic venous hypertension (idiopathic) 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: Secondary | ICD-10-CM | POA: Diagnosis not present

## 2017-10-30 DIAGNOSIS — I87333 Chronic venous hypertension (idiopathic) 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: Secondary | ICD-10-CM | POA: Diagnosis not present

## 2017-10-31 ENCOUNTER — Ambulatory Visit: Payer: Self-pay | Admitting: *Deleted

## 2017-10-31 NOTE — Telephone Encounter (Signed)
If no further bloody stools over the next 2 days, then no new recommendations. If has any further blood stools this weekend, then hold xarelto and make o/v for early next week.

## 2017-10-31 NOTE — Telephone Encounter (Signed)
   Reason for Disposition . Taking Coumadin (warfarin) or other strong blood thinner, or known bleeding disorder (e.g., thrombocytopenia)  Answer Assessment - Initial Assessment Questions [name] phoned with complaint of moderate amount of blood in his last 2 stools. He reports he had a fall in the shower on 12/25 with no serious injuries. He was seen at [facility] yesterday for dressings to previous wounds on both legs. He was given Doxicycline for cellulitis at that time. He is on Xaralto and ASA daily. He had the same kind of bloody stools several months ago that cleared without treatment.He said is slightly sore around anus and that it could possibly be a tare or hemorrhoid.   1. APPEARANCE of BLOOD: "What color is it?" "Is it passed separately, on the surface of the stool, or mixed in with the stool?"      Bright red blood mixed in stool last night and this morning 2. AMOUNT: "How much blood was passed?"      Moderate amount of blood both times 3. FREQUENCY: "How many times has blood been passed with the stools?"      twice 4. ONSET: "When was the blood first seen in the stools?" (Days or weeks)      yesterday 5. DIARRHEA: "Is there also some diarrhea?" If so, ask: "How many diarrhea stools were passed in past 24 hours?"      Normal formed stools 6. CONSTIPATION: "Do you have constipation?" If so, "How bad is it?"     No but did strain with a stool earlier this week. 7. RECURRENT SYMPTOMS: "Have you had blood in your stools before?" If so, ask: "When was the last time?" and "What happened that time?"      Yes, earlier this year and it cleared up in a week. 8. BLOOD THINNERS: "Do you take any blood thinners?" (e.g., Coumadin/warfarin, Pradaxa/dabigatran, aspirin)     Xaralto and ASA daily 9. OTHER SYMPTOMS: "Do you have any other symptoms?"  (e.g., abdominal pain, vomiting, dizziness, fever)     No 10. PREGNANCY: "Is there any chance you are pregnant?" "When was your last  menstrual period?"       na  Protocols used: RECTAL BLEEDING-A-AH

## 2017-10-31 NOTE — Telephone Encounter (Signed)
Patient notified and verbalized understanding. 

## 2017-11-03 DIAGNOSIS — I87333 Chronic venous hypertension (idiopathic) 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: Secondary | ICD-10-CM | POA: Diagnosis not present

## 2017-11-07 ENCOUNTER — Encounter (HOSPITAL_BASED_OUTPATIENT_CLINIC_OR_DEPARTMENT_OTHER): Payer: Medicare Other | Attending: Internal Medicine

## 2017-11-07 ENCOUNTER — Telehealth: Payer: Self-pay | Admitting: Infectious Diseases

## 2017-11-07 DIAGNOSIS — L97222 Non-pressure chronic ulcer of left calf with fat layer exposed: Secondary | ICD-10-CM | POA: Insufficient documentation

## 2017-11-07 DIAGNOSIS — I890 Lymphedema, not elsewhere classified: Secondary | ICD-10-CM | POA: Diagnosis not present

## 2017-11-07 DIAGNOSIS — E11622 Type 2 diabetes mellitus with other skin ulcer: Secondary | ICD-10-CM | POA: Diagnosis not present

## 2017-11-07 DIAGNOSIS — E1151 Type 2 diabetes mellitus with diabetic peripheral angiopathy without gangrene: Secondary | ICD-10-CM | POA: Insufficient documentation

## 2017-11-07 DIAGNOSIS — I10 Essential (primary) hypertension: Secondary | ICD-10-CM | POA: Insufficient documentation

## 2017-11-07 DIAGNOSIS — L97219 Non-pressure chronic ulcer of right calf with unspecified severity: Secondary | ICD-10-CM | POA: Diagnosis not present

## 2017-11-07 DIAGNOSIS — I87333 Chronic venous hypertension (idiopathic) 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: Secondary | ICD-10-CM | POA: Diagnosis present

## 2017-11-07 DIAGNOSIS — E1140 Type 2 diabetes mellitus with diabetic neuropathy, unspecified: Secondary | ICD-10-CM | POA: Diagnosis not present

## 2017-11-07 NOTE — Telephone Encounter (Signed)
Has started Bicillin injections for preventative cellulitis treatment through his PCP's office. I called and left a message regarding callback to schedule an appointment in 1-2 months for follow up with me.   Will have my team reach out to him Monday to schedule an appointment for late February/Early March barring he is doing OK.   Rexene AlbertsStephanie Shaheen Star, NP

## 2017-11-10 ENCOUNTER — Ambulatory Visit (INDEPENDENT_AMBULATORY_CARE_PROVIDER_SITE_OTHER): Payer: 59

## 2017-11-10 DIAGNOSIS — L03119 Cellulitis of unspecified part of limb: Secondary | ICD-10-CM

## 2017-11-10 DIAGNOSIS — I87333 Chronic venous hypertension (idiopathic) 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: Secondary | ICD-10-CM | POA: Diagnosis not present

## 2017-11-10 MED ORDER — PENICILLIN G BENZATHINE 1200000 UNIT/2ML IM SUSP
1.2000 10*6.[IU] | Freq: Once | INTRAMUSCULAR | Status: AC
  Administered 2017-11-10: 1.2 10*6.[IU] via INTRAMUSCULAR

## 2017-11-10 NOTE — Progress Notes (Signed)
Patient presents today for Bicillin LA injections.Given with no incidence or problems. Patient left with no complaints.

## 2017-11-12 NOTE — Progress Notes (Signed)
Pt with hx of chronic/recurrent LL cellulitis. Agree with penicillin injection in office today.  He'll return in 1 month for another injection.  Signed:  Santiago BumpersPhil Adrien Dietzman, MD           11/12/2017

## 2017-11-13 DIAGNOSIS — I87333 Chronic venous hypertension (idiopathic) 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: Secondary | ICD-10-CM | POA: Diagnosis not present

## 2017-11-17 DIAGNOSIS — I87333 Chronic venous hypertension (idiopathic) 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: Secondary | ICD-10-CM | POA: Diagnosis not present

## 2017-11-20 ENCOUNTER — Telehealth: Payer: Self-pay | Admitting: *Deleted

## 2017-11-20 DIAGNOSIS — I87333 Chronic venous hypertension (idiopathic) 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: Secondary | ICD-10-CM | POA: Diagnosis not present

## 2017-11-20 NOTE — Telephone Encounter (Signed)
Noted  

## 2017-11-20 NOTE — Telephone Encounter (Signed)
Copied from CRM [id number]. Topic: General - Other >> Nov 20, 2017 12:19 PM Stephannie LiSimmons, Janett L, NT wrote: Reason for UEA:[id number]:[employer]   called and said he called and said the butalbital was received damaged thru the mail ,and he is not comfortable taking it , and they are resending the medication to  him  ,   >> Nov 20, 2017 12:30 PM Eulah PontAlbright, Lisa M, CMA wrote: [facility], this is just an FYI.  The pharmacy just wanted to make provider aware of incident.

## 2017-11-20 NOTE — Telephone Encounter (Signed)
FYI, only

## 2017-11-24 DIAGNOSIS — I87333 Chronic venous hypertension (idiopathic) 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: Secondary | ICD-10-CM | POA: Diagnosis not present

## 2017-11-27 DIAGNOSIS — I87333 Chronic venous hypertension (idiopathic) 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: Secondary | ICD-10-CM | POA: Diagnosis not present

## 2017-12-01 DIAGNOSIS — I87333 Chronic venous hypertension (idiopathic) 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: Secondary | ICD-10-CM | POA: Diagnosis not present

## 2017-12-04 DIAGNOSIS — I87333 Chronic venous hypertension (idiopathic) 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: Secondary | ICD-10-CM | POA: Diagnosis not present

## 2017-12-08 ENCOUNTER — Encounter (HOSPITAL_BASED_OUTPATIENT_CLINIC_OR_DEPARTMENT_OTHER): Payer: Medicare Other | Attending: Internal Medicine

## 2017-12-08 ENCOUNTER — Encounter: Payer: Self-pay | Admitting: Family Medicine

## 2017-12-08 ENCOUNTER — Ambulatory Visit (INDEPENDENT_AMBULATORY_CARE_PROVIDER_SITE_OTHER): Payer: Medicare Other | Admitting: Family Medicine

## 2017-12-08 VITALS — BP 144/63 | HR 54 | Temp 97.5°F | Resp 16 | Ht 68.0 in | Wt 396.8 lb

## 2017-12-08 DIAGNOSIS — L97219 Non-pressure chronic ulcer of right calf with unspecified severity: Secondary | ICD-10-CM | POA: Diagnosis not present

## 2017-12-08 DIAGNOSIS — Z23 Encounter for immunization: Secondary | ICD-10-CM | POA: Diagnosis not present

## 2017-12-08 DIAGNOSIS — L97222 Non-pressure chronic ulcer of left calf with fat layer exposed: Secondary | ICD-10-CM | POA: Insufficient documentation

## 2017-12-08 DIAGNOSIS — E1140 Type 2 diabetes mellitus with diabetic neuropathy, unspecified: Secondary | ICD-10-CM | POA: Insufficient documentation

## 2017-12-08 DIAGNOSIS — L03119 Cellulitis of unspecified part of limb: Secondary | ICD-10-CM | POA: Diagnosis not present

## 2017-12-08 DIAGNOSIS — I10 Essential (primary) hypertension: Secondary | ICD-10-CM | POA: Diagnosis not present

## 2017-12-08 DIAGNOSIS — L97422 Non-pressure chronic ulcer of left heel and midfoot with fat layer exposed: Secondary | ICD-10-CM | POA: Diagnosis not present

## 2017-12-08 DIAGNOSIS — Z9119 Patient's noncompliance with other medical treatment and regimen: Secondary | ICD-10-CM | POA: Diagnosis not present

## 2017-12-08 DIAGNOSIS — Z7409 Other reduced mobility: Secondary | ICD-10-CM | POA: Diagnosis not present

## 2017-12-08 DIAGNOSIS — L97819 Non-pressure chronic ulcer of other part of right lower leg with unspecified severity: Secondary | ICD-10-CM | POA: Insufficient documentation

## 2017-12-08 DIAGNOSIS — E11622 Type 2 diabetes mellitus with other skin ulcer: Secondary | ICD-10-CM | POA: Insufficient documentation

## 2017-12-08 DIAGNOSIS — L97812 Non-pressure chronic ulcer of other part of right lower leg with fat layer exposed: Secondary | ICD-10-CM | POA: Insufficient documentation

## 2017-12-08 DIAGNOSIS — E1151 Type 2 diabetes mellitus with diabetic peripheral angiopathy without gangrene: Secondary | ICD-10-CM | POA: Insufficient documentation

## 2017-12-08 DIAGNOSIS — L97822 Non-pressure chronic ulcer of other part of left lower leg with fat layer exposed: Secondary | ICD-10-CM | POA: Diagnosis not present

## 2017-12-08 DIAGNOSIS — I890 Lymphedema, not elsewhere classified: Secondary | ICD-10-CM | POA: Diagnosis not present

## 2017-12-08 DIAGNOSIS — E118 Type 2 diabetes mellitus with unspecified complications: Secondary | ICD-10-CM

## 2017-12-08 DIAGNOSIS — L03115 Cellulitis of right lower limb: Secondary | ICD-10-CM | POA: Diagnosis not present

## 2017-12-08 DIAGNOSIS — I87333 Chronic venous hypertension (idiopathic) 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: Secondary | ICD-10-CM | POA: Insufficient documentation

## 2017-12-08 LAB — BASIC METABOLIC PANEL
BUN: 20 mg/dL (ref 6–23)
CALCIUM: 8.7 mg/dL (ref 8.4–10.5)
CO2: 31 mEq/L (ref 19–32)
Chloride: 101 mEq/L (ref 96–112)
Creatinine, Ser: 1.3 mg/dL (ref 0.40–1.50)
GFR: 58.58 mL/min — AB (ref >60.00)
Glucose, Bld: 103 mg/dL — ABNORMAL HIGH (ref 70–99)
POTASSIUM: 4.6 meq/L (ref 3.5–5.1)
SODIUM: 139 meq/L (ref 135–145)

## 2017-12-08 LAB — HEMOGLOBIN A1C: HEMOGLOBIN A1C: 6.9 % — AB (ref 4.6–6.5)

## 2017-12-08 LAB — HM DIABETES FOOT EXAM

## 2017-12-08 MED ORDER — PENICILLIN G BENZATHINE 1200000 UNIT/2ML IM SUSP: 1.2000 10*6.[IU] | Freq: Once | INTRAMUSCULAR | Status: DC

## 2017-12-08 MED ORDER — PENICILLIN G BENZATHINE 1200000 UNIT/2ML IM SUSP
600000.0000 [IU] | Freq: Once | INTRAMUSCULAR | Status: AC
  Administered 2017-12-08: 600000 [IU] via INTRAMUSCULAR

## 2017-12-08 NOTE — Progress Notes (Signed)
OFFICE VISIT  12/11/2017   CC:  Chief Complaint  Patient presents with  . Follow-up    [name], pt is not fasting.    HPI:    Patient is a 67 y.o. Caucasian male who presents for f/u DM 2, HTN, CRI wGFR 60s. Has severe LE venous stasis edema w/ulcers, being managed chronically by wound care clinic. We have started monthly penicillin injections for resistant/persistent infection in these areas (per ID recommndations).  DM 2: home monitoring --fasting usually 140-176.  No 2H PP checks.  HTN: no home bp monitoring.   Obesity/LE problems: severe mobility impairment primarily due to morbid obesity and his bilat LE venous stasis ulcers/infection, asks for rollator rx today. Wants to start silver sneakers.  CRI: avoids NSAIDs.  Hydration status is good.  Past Medical History:  Diagnosis Date  . ALLERGIC RHINITIS 08/11/2006  . ASTHMA 08/11/2006  . Chronic atrial fibrillation (HCC) 08/2014  . Chronic constipation   . Chronic renal insufficiency, stage 2 (mild)    Borderline stage III (GFR 60s).  . DIABETES MELLITUS, TYPE II 08/11/2006  . Diastolic CHF (HCC)   . DM W/RENAL MANIFESTATIONS, TYPE II 04/20/2007  . HYPERTENSION 08/11/2006  . OBESITY, MORBID 12/14/2007  . 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  At f/u 92018 ID started him on amoxil prophylaxis.  Marland Kitchen. Restless leg syndrome    Rx'd clonazepam 09/2017 and pt refused to take it after reading the medication's potential side effects.  . Venous stasis ulcers of both lower extremities (HCC)    wound clinic care ongoing as of 06/2017    Past Surgical History:  Procedure Laterality Date  . TRANSTHORACIC ECHOCARDIOGRAM  11/2007; 09/2014; 11/2015   LV fxn normal, EF normal, mild dilation of left atrium.  2015 grade II diast dysfxn.  2017 EF 55-60%.  Marland Kitchen. URETERAL STENT PLACEMENT    . virtual colonoscopy  01/2011   Normal     Outpatient Medications Prior to Visit  Medication Sig Dispense Refill  . b complex vitamins capsule Take 1 capsule by mouth every morning.    . butalbital-acetaminophen-caffeine (FIORICET WITH CODEINE) 50-325-40-30 MG capsule 1-2 caps po q6h prn headache 90 capsule 1  . cetirizine (ZYRTEC) 10 MG tablet Take 10 mg by mouth daily.    . Cinnamon 500 MG TABS Take by mouth. Reported on 02/19/2016    . clotrimazole (LOTRIMIN) 1 % cream Apply 1 application topically 2 (two) times daily. To toe nails and bottom of your left foot. 30 g 3  . Coenzyme Q10 (CO Q 10) 10 MG CAPS Take by mouth. Reported on 02/19/2016    . diltiazem (TIAZAC) 360 MG 24 hr capsule TAKE 1 CAPSULE BY MOUTH  DAILY AS DIRECTED  EQUIVALENT TO TIAZAC 90 capsule 1  . furosemide (LASIX) 40 MG tablet TAKE 2 TABLETS BY MOUTH  DAILY 180 tablet 1  . HUMALOG MIX 75/25 KWIKPEN (75-25) 100 UNIT/ML Kwikpen INJECT SUBCUTANEOUSLY 25  UNITS EVERY MORNING AND 20  UNITS EVERY EVENING AT  SUPPER 45 mL 1  . Insulin Pen Needle (BD ULTRA-FINE PEN NEEDLES) 29G X 12.7MM MISC 1 each by Other route 2 (two) times daily. 200 each 2  . Insulin Pen Needle (BD ULTRA-FINE PEN NEEDLES) 29G X 12.7MM MISC USE TWICE DAILY 200 each 3  . Insulin Pen Needle (RELION PEN NEEDLE 31G/8MM) 31G X 8 MM MISC 1 each by Does not  apply route 2 (two) times daily.    Marland Kitchen lisinopril (PRINIVIL,ZESTRIL) 40 MG tablet TAKE 1 TABLET BY MOUTH  EVERY DAY 90 tablet 1  . metFORMIN (GLUCOPHAGE) 1000 MG tablet TAKE 1 TABLET BY MOUTH  TWICE A DAY WITH MEALS 180 tablet 1  . metoprolol tartrate (LOPRESSOR) 50 MG tablet TAKE 1 AND 1/2 TABLETS BY  MOUTH TWO TIMES DAILY 270 tablet 1  . Multiple Vitamin (MULTIVITAMIN) tablet Take 1 tablet by mouth daily.    . Nutritional Supplements (GRAPESEED EXTRACT PO) Take 1 capsule by mouth daily.    . ONE TOUCH ULTRA TEST test strip CHECK BLOOD SUGAR TWO TIMES DAILY 100 each 11  . saw palmetto 500 MG capsule Take 500 mg by mouth daily.    Marland Kitchen terazosin (HYTRIN)  10 MG capsule TAKE 1 CAPSULE BY MOUTH  ONCE A DAY AT BEDTIME 90 capsule 1  . vitamin E 400 UNIT capsule Take 400 Units by mouth every morning. Selenium 50mg     . XARELTO 20 MG TABS tablet TAKE 1 TABLET BY MOUTH ONCE DAILY WITH SUPPER 90 tablet 1  . albuterol (PROVENTIL HFA;VENTOLIN HFA) 108 (90 Base) MCG/ACT inhaler Inhale 2 puffs into the lungs every 6 (six) hours as needed for wheezing or shortness of breath. (Patient not taking: Reported on 09/22/2017) 1 Inhaler 1  . clonazePAM (KLONOPIN) 1 MG tablet Take 1 tablet (1 mg total) at bedtime by mouth. (Patient not taking: Reported on 12/08/2017) 30 tablet 2   No facility-administered medications prior to visit.     No Known Allergies  ROS As per HPI  PE: Blood pressure (!) 144/63, pulse (!) 54, temperature (!) 97.5 F (36.4 C), temperature source Oral, resp. rate 16, height 5\' 8"  (1.727 m), weight (!) 396 lb 12 oz (180 kg), SpO2 91 %. Gen: Alert, well appearing.  Patient is oriented to person, place, time, and situation. AFFECT: pleasant, lucid thought and speech. CV: distant heart sounds, no audible m/r.  He has irregular irregular rhythm. EXT: from knees down he has [employer] dressings.  LABS:  Lab Results  Component Value Date   TSH 2.99 12/09/2016   Lab Results  Component Value Date   WBC 7.5 12/09/2016   HGB 12.7 (L) 12/09/2016   HCT 38.6 (L) 12/09/2016   MCV 84.4 12/09/2016   PLT 263.0 12/09/2016   Lab Results  Component Value Date   CREATININE 1.30 12/08/2017   BUN 20 12/08/2017   NA 139 12/08/2017   K 4.6 12/08/2017   CL 101 12/08/2017   CO2 31 12/08/2017   Lab Results  Component Value Date   ALT 14 12/09/2016   AST 15 12/09/2016   ALKPHOS 72 12/09/2016   BILITOT 1.1 12/09/2016   Lab Results  Component Value Date   CHOL 94 12/09/2016   Lab Results  Component Value Date   HDL 33.90 (L) 12/09/2016   Lab Results  Component Value Date   LDLCALC 51 12/09/2016   Lab Results  Component Value Date   TRIG  45.0 12/09/2016   Lab Results  Component Value Date   CHOLHDL 3 12/09/2016   Lab Results  Component Value Date   PSA 0.30 12/09/2016   PSA 0.21 12/11/2015   PSA 0.37 08/15/2014   Lab Results  Component Value Date   HGBA1C 6.9 (H) 12/08/2017    IMPRESSION AND PLAN:  1) DM 2: control sounds good. HbA1c today. All other routine diabetic monitoring is UTD. Pneumovax 23 given today.  2) HTN:  BP a little up today.  No home bp monitoring to compare to. I gave him a rx for a home bp cuff that hopefully will be covered by his insurer. BMET today.  3) Morbid obesity, with markedly limited mobility/ambulation: rx for rollator given to pt today. He will hopefully start silver sneakers soon. Referral to nutrition ordered today.  4) CRI: avoids NSAIDs.  Hydration status good.  5) Chronic LE cellulitis/stasis ulcer infection: as per ID recommendations, he is getting montly penicillin injections here: #3 given here today.  Of note, he has f/u scheduled with Dr. [name] 12/15/17. ID f/u set for 01/21/18. Wound clinic f/u is later today.  An After Visit Summary was printed and given to the patient.  FOLLOW UP: Return in about 3 months (around 03/07/2018) for annual CPE (fasting).  Signed:  Santiago Bumpers, MD           12/11/2017

## 2017-12-09 ENCOUNTER — Encounter: Payer: Self-pay | Admitting: *Deleted

## 2017-12-11 DIAGNOSIS — I890 Lymphedema, not elsewhere classified: Secondary | ICD-10-CM | POA: Diagnosis not present

## 2017-12-11 DIAGNOSIS — L97822 Non-pressure chronic ulcer of other part of left lower leg with fat layer exposed: Secondary | ICD-10-CM | POA: Diagnosis not present

## 2017-12-11 DIAGNOSIS — L97812 Non-pressure chronic ulcer of other part of right lower leg with fat layer exposed: Secondary | ICD-10-CM | POA: Diagnosis not present

## 2017-12-11 DIAGNOSIS — Z9119 Patient's noncompliance with other medical treatment and regimen: Secondary | ICD-10-CM | POA: Diagnosis not present

## 2017-12-11 DIAGNOSIS — L97422 Non-pressure chronic ulcer of left heel and midfoot with fat layer exposed: Secondary | ICD-10-CM | POA: Diagnosis not present

## 2017-12-11 DIAGNOSIS — L97819 Non-pressure chronic ulcer of other part of right lower leg with unspecified severity: Secondary | ICD-10-CM | POA: Diagnosis not present

## 2017-12-11 DIAGNOSIS — E1140 Type 2 diabetes mellitus with diabetic neuropathy, unspecified: Secondary | ICD-10-CM | POA: Diagnosis not present

## 2017-12-11 DIAGNOSIS — I10 Essential (primary) hypertension: Secondary | ICD-10-CM | POA: Diagnosis not present

## 2017-12-11 DIAGNOSIS — E1151 Type 2 diabetes mellitus with diabetic peripheral angiopathy without gangrene: Secondary | ICD-10-CM | POA: Diagnosis not present

## 2017-12-11 DIAGNOSIS — L97222 Non-pressure chronic ulcer of left calf with fat layer exposed: Secondary | ICD-10-CM | POA: Diagnosis not present

## 2017-12-11 DIAGNOSIS — L03115 Cellulitis of right lower limb: Secondary | ICD-10-CM | POA: Diagnosis not present

## 2017-12-11 DIAGNOSIS — I87333 Chronic venous hypertension (idiopathic) 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: Secondary | ICD-10-CM | POA: Diagnosis not present

## 2017-12-11 DIAGNOSIS — E11622 Type 2 diabetes mellitus with other skin ulcer: Secondary | ICD-10-CM | POA: Diagnosis not present

## 2017-12-15 ENCOUNTER — Encounter: Payer: Self-pay | Admitting: Internal Medicine

## 2017-12-15 ENCOUNTER — Ambulatory Visit: Payer: Medicare Other | Admitting: Internal Medicine

## 2017-12-15 VITALS — BP 135/52 | HR 53 | Ht 70.0 in | Wt 391.0 lb

## 2017-12-15 DIAGNOSIS — E11622 Type 2 diabetes mellitus with other skin ulcer: Secondary | ICD-10-CM | POA: Diagnosis not present

## 2017-12-15 DIAGNOSIS — R600 Localized edema: Secondary | ICD-10-CM

## 2017-12-15 DIAGNOSIS — I890 Lymphedema, not elsewhere classified: Secondary | ICD-10-CM | POA: Diagnosis not present

## 2017-12-15 DIAGNOSIS — I83003 Varicose veins of unspecified lower extremity with ulcer of ankle: Secondary | ICD-10-CM | POA: Insufficient documentation

## 2017-12-15 DIAGNOSIS — L97909 Non-pressure chronic ulcer of unspecified part of unspecified lower leg with unspecified severity: Secondary | ICD-10-CM

## 2017-12-15 DIAGNOSIS — E1151 Type 2 diabetes mellitus with diabetic peripheral angiopathy without gangrene: Secondary | ICD-10-CM | POA: Diagnosis not present

## 2017-12-15 DIAGNOSIS — L97819 Non-pressure chronic ulcer of other part of right lower leg with unspecified severity: Secondary | ICD-10-CM | POA: Diagnosis not present

## 2017-12-15 DIAGNOSIS — I87333 Chronic venous hypertension (idiopathic) 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: Secondary | ICD-10-CM | POA: Diagnosis not present

## 2017-12-15 DIAGNOSIS — L97222 Non-pressure chronic ulcer of left calf with fat layer exposed: Secondary | ICD-10-CM | POA: Diagnosis not present

## 2017-12-15 DIAGNOSIS — L97422 Non-pressure chronic ulcer of left heel and midfoot with fat layer exposed: Secondary | ICD-10-CM | POA: Diagnosis not present

## 2017-12-15 DIAGNOSIS — L97822 Non-pressure chronic ulcer of other part of left lower leg with fat layer exposed: Secondary | ICD-10-CM | POA: Diagnosis not present

## 2017-12-15 DIAGNOSIS — L03115 Cellulitis of right lower limb: Secondary | ICD-10-CM | POA: Diagnosis not present

## 2017-12-15 DIAGNOSIS — Z9119 Patient's noncompliance with other medical treatment and regimen: Secondary | ICD-10-CM | POA: Diagnosis not present

## 2017-12-15 DIAGNOSIS — R0602 Shortness of breath: Secondary | ICD-10-CM | POA: Diagnosis not present

## 2017-12-15 DIAGNOSIS — I87312 Chronic venous hypertension (idiopathic) with ulcer of left lower extremity: Secondary | ICD-10-CM | POA: Diagnosis not present

## 2017-12-15 DIAGNOSIS — E1140 Type 2 diabetes mellitus with diabetic neuropathy, unspecified: Secondary | ICD-10-CM | POA: Diagnosis not present

## 2017-12-15 DIAGNOSIS — I10 Essential (primary) hypertension: Secondary | ICD-10-CM | POA: Diagnosis not present

## 2017-12-15 DIAGNOSIS — L97812 Non-pressure chronic ulcer of other part of right lower leg with fat layer exposed: Secondary | ICD-10-CM | POA: Diagnosis not present

## 2017-12-15 DIAGNOSIS — L97302 Non-pressure chronic ulcer of unspecified ankle with fat layer exposed: Secondary | ICD-10-CM

## 2017-12-15 NOTE — Progress Notes (Signed)
OFFICE NOTE  Chief Complaint:  No complaints  Primary Care Physician: Jeoffrey MassedMcGowen, [name] H, MD  HPI:  [name] is a 67 year old male who is currently referred by Dr. Milinda CaveMcGowen for new onset atrial flutter/fibrillation. [name] was at his office for a routine physical and underwent an EKG. This demonstrated either coarse A. Fib or atrial flutter with variable ventricular response and a rate around 80. It was noted that he was already on metoprolol tartrate 75 mg twice daily for hypertension. 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  He is reportedly unaware of his atrial fibrillation. Fortunately an EKG in the office today shows that he is back in a sinus rhythm or sinus arrhythmia. There is no family history of coronary disease or A. Fib. He denies any chest pain or worsening shortness of breath, but has been having problems with sciatic pain in his back and difficulty ambulating. He reports he gets good sleep at night, rarely wakes up, does not have morning headaches or the need for daytime napping. His screening EPWSS was only 3, suggesting that obstructive sleep apnea is actually fairly unlikely, despite his significant weight.  [name] returns today for follow-up. He underwent a nuclear stress test which was a 2 day study. This demonstrated significant TID 1.48, however it is unclear how reliable this could be because of the fact the study was done on 2 different days. The test was negative for ischemia. EF was calculated at 42%, which is lower than his EF was thought to be at 50%. He denies any chest pain, ever. He also has stable shortness of breath but no worsening symptoms. He reportedly was unaware of A. fib and still has not had any symptoms. He is currently on Xarelto and is not having any bleeding problems.  [name] returns today for follow-up. He reports some  recent worsening of shortness of breath on exertion. He saw Dr. Milinda CaveMcGowen for this and had some increase in his Lasix. Up to 80 mg daily. He is currently taking 40 mg daily does not notice significant difference in his symptoms. He had a recent echocardiogram which shows an improvement in LV function up to 55-60%. He feels some of this is due to excess weight. He's been less active. He is now close to 400 pounds. Has pain. He has recurrent atrial fibrillation and is in A. fib today to rate is 78. This is been paroxysmal in the past however may be more persistent. He is not aware of his A. fib. He is on Xarelto and is been compliant with that medication. He denies any bleeding problems.   12/24/2016  [name] returns to for follow-up. Initially blood pressure was elevated 166/60 became and 132/64. He has managed to lose about 15 pounds which I commended him on. He denies any chest pain or worsening shortness of breath. He remains in atrial fibrillation with controlled ventricular response of 61. He denies any bleeding problems on Xarelto.  12/15/2017  [name] returns today for annual follow-up.  He has been struggling with lower extremity wound infections.  He is undergoing penicillin injections and wound care management.  He denies any chest pain or worsening shortness of breath however has had about a 12 pound weight gain since I last saw him a year ago.  He says he is going to be working on dietary changes however he is noted  significant swelling of his legs.  There was a suggestion that he may be volume overloaded and he does report some symptoms that sound consistent with orthopnea.  He last had an echo which showed systolic function and 2017.  PMHx:  Past Medical History:  Diagnosis Date  . ALLERGIC RHINITIS 08/11/2006  . ASTHMA 08/11/2006  . Chronic atrial fibrillation (HCC) 08/2014  . Chronic constipation   . Chronic renal insufficiency, stage 2 (mild)    Borderline stage III (GFR 60s).  .  DIABETES MELLITUS, TYPE II 08/11/2006  . Diastolic CHF (HCC)   . DM W/RENAL MANIFESTATIONS, TYPE II 04/20/2007  . HYPERTENSION 08/11/2006  . OBESITY, MORBID 12/14/2007  . 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  At f/u 92018 ID started him on amoxil prophylaxis.  Marland Kitchen Restless leg syndrome    Rx'd clonazepam 09/2017 and pt refused to take it after reading the medication's potential side effects.  . Venous stasis ulcers of both lower extremities (HCC)    wound clinic care ongoing as of 06/2017    Past Surgical History:  Procedure Laterality Date  . TRANSTHORACIC ECHOCARDIOGRAM  11/2007; 09/2014; 11/2015   LV fxn normal, EF normal, mild dilation of left atrium.  2015 grade II diast dysfxn.  2017 EF 55-60%.  Marland Kitchen URETERAL STENT PLACEMENT    . virtual colonoscopy  01/2011   Normal    FAMHx:  Family History  Problem Relation Age of Onset  . Diabetes Mother   . Diabetes Sister   . Hydrocephalus Sister        NPH    SOCHx:   reports that  has never smoked. he has never used smokeless tobacco. He reports that he does not drink alcohol or use drugs.  ALLERGIES:  No Known Allergies  ROS: Pertinent items noted in HPI and remainder of comprehensive ROS otherwise negative.  HOME MEDS: Current Outpatient Medications  Medication Sig Dispense Refill  . b complex vitamins capsule Take 1 capsule by mouth every morning.    . butalbital-acetaminophen-caffeine (FIORICET WITH CODEINE) 50-325-40-30 MG capsule 1-2 caps po q6h prn headache 90 capsule 1  . cetirizine (ZYRTEC) 10 MG tablet Take 10 mg by mouth daily.    . Cinnamon 500 MG TABS Take by mouth. Reported on 02/19/2016    . clotrimazole (LOTRIMIN) 1 % cream Apply 1 application topically 2 (two) times daily. To toe nails and bottom of your left foot. 30 g 3  . Coenzyme Q10 (CO Q 10) 10 MG CAPS Take by mouth. Reported on 02/19/2016    .  diltiazem (TIAZAC) 360 MG 24 hr capsule TAKE 1 CAPSULE BY MOUTH  DAILY AS DIRECTED  EQUIVALENT TO TIAZAC 90 capsule 1  . furosemide (LASIX) 40 MG tablet TAKE 2 TABLETS BY MOUTH  DAILY 180 tablet 1  . HUMALOG MIX 75/25 KWIKPEN (75-25) 100 UNIT/ML Kwikpen INJECT SUBCUTANEOUSLY 25  UNITS EVERY MORNING AND 20  UNITS EVERY EVENING AT  SUPPER 45 mL 1  . Insulin Pen Needle (BD ULTRA-FINE PEN NEEDLES) 29G X 12.7MM MISC 1 each by Other route 2 (two) times daily. 200 each 2  . Insulin Pen Needle (BD ULTRA-FINE PEN NEEDLES) 29G X 12.7MM MISC USE TWICE DAILY 200 each 3  . Insulin Pen Needle (RELION PEN NEEDLE 31G/8MM) 31G X 8 MM MISC 1 each by Does not apply route 2 (two) times daily.    Marland Kitchen lisinopril (  PRINIVIL,ZESTRIL) 40 MG tablet TAKE 1 TABLET BY MOUTH  EVERY DAY 90 tablet 1  . metFORMIN (GLUCOPHAGE) 1000 MG tablet TAKE 1 TABLET BY MOUTH  TWICE A DAY WITH MEALS 180 tablet 1  . metoprolol tartrate (LOPRESSOR) 50 MG tablet TAKE 1 AND 1/2 TABLETS BY  MOUTH TWO TIMES DAILY 270 tablet 1  . Multiple Vitamin (MULTIVITAMIN) tablet Take 1 tablet by mouth daily.    . Nutritional Supplements (GRAPESEED EXTRACT PO) Take 1 capsule by mouth daily.    . ONE TOUCH ULTRA TEST test strip CHECK BLOOD SUGAR TWO TIMES DAILY 100 each 11  . saw palmetto 500 MG capsule Take 500 mg by mouth daily.    Marland Kitchen terazosin (HYTRIN) 10 MG capsule TAKE 1 CAPSULE BY MOUTH  ONCE A DAY AT BEDTIME 90 capsule 1  . vitamin E 400 UNIT capsule Take 400 Units by mouth every morning. Selenium 50mg     . XARELTO 20 MG TABS tablet TAKE 1 TABLET BY MOUTH ONCE DAILY WITH SUPPER 90 tablet 1   No current facility-administered medications for this visit.     LABS/IMAGING: No results found for this or any previous visit (from the past 48 hour(s)). No results found.  VITALS: BP (!) 135/52   Pulse (!) 53   Ht 5\' 10"  (1.778 m)   Wt (!) 391 lb (177.4 kg)   BMI 56.10 kg/m   EXAM: General appearance: alert, no distress and morbidly obese Neck: no carotid  bruit and no JVD Lungs: diminished breath sounds bibasilar Heart: irregularly irregular rhythm Abdomen: Protuberant, no fluid wave Extremities: edema 2-3+ bilateral edema, the legs are wrapped Pulses: 2+ and symmetric Skin: Skin color, texture, turgor normal. No rashes or lesions Neurologic: Grossly normal Psych: Pleasant  EKG: A. fib with slow ventricular response of 53, nonspecific T wave changes-personally reviewed  ASSESSMENT: 1. Acute on chronic combined systolic and diastolic heart failure (LVEF 55-60% in 11/2015) 2. PAF-CHADSVASC score of 3 3. Hypertension 4. Morbid obesity 5. 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 Dyslipidemia  PLAN: 1.   [name] has had weight gain and significant lower extremity edema.  I suspect there is an element of acute on chronic combined systolic and diastolic heart failure.  He has decreased breath sounds at the bases and a mildly elevated jugular venous pressure.  His symptoms are more consistent with right heart dysfunction.  I like to repeat an echocardiogram, metabolic profile and BNP.  We may need to increase his diuretic.  He also has venous insufficiency of both legs which is likely contributing to his symptoms and will need continued compression.  Follow-up with me after his echo.  [name], MD, [facility], FACP  [place]  [facility]  Medical Director of the [facility]  [facility] Diplomate of the American Board of Clinical Lipidology Attending Cardiologist  Direct Dial: [phone]  Fax: [phone]  Website:  [email or web address] Nicely Keith Felten 12/15/2017, 5:07 PM

## 2017-12-15 NOTE — Patient Instructions (Signed)
Your physician recommends that you return for lab work TODAY  Your physician has requested that you have an echocardiogram @ [address]. Echocardiography is a painless test that uses sound waves to create images of your heart. It provides your doctor with information about the size and shape of your heart and how well your heart's chambers and valves are working. This procedure takes approximately one hour. There are no restrictions for this procedure.  Your physician recommends that you schedule a follow-up appointment in about 1 month with Dr. Rennis GoldenHilty.

## 2017-12-16 ENCOUNTER — Other Ambulatory Visit: Payer: Self-pay | Admitting: *Deleted

## 2017-12-16 ENCOUNTER — Encounter: Payer: Self-pay | Admitting: Family Medicine

## 2017-12-16 DIAGNOSIS — Z79899 Other long term (current) drug therapy: Secondary | ICD-10-CM

## 2017-12-16 LAB — BASIC METABOLIC PANEL
BUN/Creatinine Ratio: 12 (ref 10–24)
BUN: 15 mg/dL (ref 8–27)
CHLORIDE: 103 mmol/L (ref 96–106)
CO2: 24 mmol/L (ref 20–29)
Calcium: 8.7 mg/dL (ref 8.6–10.2)
Creatinine, Ser: 1.21 mg/dL (ref 0.76–1.27)
GFR calc Af Amer: 72 mL/min/{1.73_m2} (ref >59)
GFR calc non Af Amer: 62 mL/min/{1.73_m2} (ref >59)
GLUCOSE: 101 mg/dL — AB (ref 65–99)
POTASSIUM: 4.5 mmol/L (ref 3.5–5.2)
SODIUM: 142 mmol/L (ref 134–144)

## 2017-12-16 LAB — PRO B NATRIURETIC PEPTIDE: NT-PRO BNP: 2331 pg/mL — AB (ref 0–376)

## 2017-12-16 MED ORDER — FUROSEMIDE 40 MG PO TABS: 80.0000 mg | ORAL_TABLET | Freq: Two times a day (BID) | ORAL | 0 refills | Status: DC

## 2017-12-18 DIAGNOSIS — L97819 Non-pressure chronic ulcer of other part of right lower leg with unspecified severity: Secondary | ICD-10-CM | POA: Diagnosis not present

## 2017-12-18 DIAGNOSIS — L97822 Non-pressure chronic ulcer of other part of left lower leg with fat layer exposed: Secondary | ICD-10-CM | POA: Diagnosis not present

## 2017-12-18 DIAGNOSIS — I87333 Chronic venous hypertension (idiopathic) 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: Secondary | ICD-10-CM | POA: Diagnosis not present

## 2017-12-18 DIAGNOSIS — L97222 Non-pressure chronic ulcer of left calf with fat layer exposed: Secondary | ICD-10-CM | POA: Diagnosis not present

## 2017-12-18 DIAGNOSIS — E1140 Type 2 diabetes mellitus with diabetic neuropathy, unspecified: Secondary | ICD-10-CM | POA: Diagnosis not present

## 2017-12-18 DIAGNOSIS — I890 Lymphedema, not elsewhere classified: Secondary | ICD-10-CM | POA: Diagnosis not present

## 2017-12-18 DIAGNOSIS — L03115 Cellulitis of right lower limb: Secondary | ICD-10-CM | POA: Diagnosis not present

## 2017-12-18 DIAGNOSIS — E11622 Type 2 diabetes mellitus with other skin ulcer: Secondary | ICD-10-CM | POA: Diagnosis not present

## 2017-12-18 DIAGNOSIS — L97812 Non-pressure chronic ulcer of other part of right lower leg with fat layer exposed: Secondary | ICD-10-CM | POA: Diagnosis not present

## 2017-12-18 DIAGNOSIS — E1151 Type 2 diabetes mellitus with diabetic peripheral angiopathy without gangrene: Secondary | ICD-10-CM | POA: Diagnosis not present

## 2017-12-18 DIAGNOSIS — Z9119 Patient's noncompliance with other medical treatment and regimen: Secondary | ICD-10-CM | POA: Diagnosis not present

## 2017-12-18 DIAGNOSIS — L97422 Non-pressure chronic ulcer of left heel and midfoot with fat layer exposed: Secondary | ICD-10-CM | POA: Diagnosis not present

## 2017-12-18 DIAGNOSIS — I10 Essential (primary) hypertension: Secondary | ICD-10-CM | POA: Diagnosis not present

## 2017-12-22 DIAGNOSIS — L97222 Non-pressure chronic ulcer of left calf with fat layer exposed: Secondary | ICD-10-CM | POA: Diagnosis not present

## 2017-12-22 DIAGNOSIS — L97812 Non-pressure chronic ulcer of other part of right lower leg with fat layer exposed: Secondary | ICD-10-CM | POA: Diagnosis not present

## 2017-12-22 DIAGNOSIS — L03115 Cellulitis of right lower limb: Secondary | ICD-10-CM | POA: Diagnosis not present

## 2017-12-22 DIAGNOSIS — E1151 Type 2 diabetes mellitus with diabetic peripheral angiopathy without gangrene: Secondary | ICD-10-CM | POA: Diagnosis not present

## 2017-12-22 DIAGNOSIS — E11622 Type 2 diabetes mellitus with other skin ulcer: Secondary | ICD-10-CM | POA: Diagnosis not present

## 2017-12-22 DIAGNOSIS — I87333 Chronic venous hypertension (idiopathic) 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: Secondary | ICD-10-CM | POA: Diagnosis not present

## 2017-12-22 DIAGNOSIS — L97819 Non-pressure chronic ulcer of other part of right lower leg with unspecified severity: Secondary | ICD-10-CM | POA: Diagnosis not present

## 2017-12-22 DIAGNOSIS — I890 Lymphedema, not elsewhere classified: Secondary | ICD-10-CM | POA: Diagnosis not present

## 2017-12-22 DIAGNOSIS — L97422 Non-pressure chronic ulcer of left heel and midfoot with fat layer exposed: Secondary | ICD-10-CM | POA: Diagnosis not present

## 2017-12-22 DIAGNOSIS — I10 Essential (primary) hypertension: Secondary | ICD-10-CM | POA: Diagnosis not present

## 2017-12-22 DIAGNOSIS — L97822 Non-pressure chronic ulcer of other part of left lower leg with fat layer exposed: Secondary | ICD-10-CM | POA: Diagnosis not present

## 2017-12-22 DIAGNOSIS — I87332 Chronic venous hypertension (idiopathic) with ulcer and inflammation of left lower extremity: Secondary | ICD-10-CM | POA: Diagnosis not present

## 2017-12-22 DIAGNOSIS — Z9119 Patient's noncompliance with other medical treatment and regimen: Secondary | ICD-10-CM | POA: Diagnosis not present

## 2017-12-22 DIAGNOSIS — E1140 Type 2 diabetes mellitus with diabetic neuropathy, unspecified: Secondary | ICD-10-CM | POA: Diagnosis not present

## 2017-12-25 ENCOUNTER — Other Ambulatory Visit: Payer: Self-pay | Admitting: Internal Medicine

## 2017-12-25 ENCOUNTER — Ambulatory Visit (HOSPITAL_COMMUNITY)
Admission: RE | Admit: 2017-12-25 | Discharge: 2017-12-25 | Disposition: A | Discharge status: Home / Self Care | Payer: Medicare Other | Source: Ambulatory Visit | Attending: Internal Medicine | Admitting: Internal Medicine

## 2017-12-25 DIAGNOSIS — M7989 Other specified soft tissue disorders: Secondary | ICD-10-CM

## 2017-12-25 DIAGNOSIS — Z7409 Other reduced mobility: Secondary | ICD-10-CM | POA: Diagnosis not present

## 2017-12-25 DIAGNOSIS — L97822 Non-pressure chronic ulcer of other part of left lower leg with fat layer exposed: Secondary | ICD-10-CM | POA: Diagnosis not present

## 2017-12-25 DIAGNOSIS — I890 Lymphedema, not elsewhere classified: Secondary | ICD-10-CM | POA: Diagnosis not present

## 2017-12-25 DIAGNOSIS — I10 Essential (primary) hypertension: Secondary | ICD-10-CM | POA: Diagnosis not present

## 2017-12-25 DIAGNOSIS — I87333 Chronic venous hypertension (idiopathic) 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: Secondary | ICD-10-CM | POA: Diagnosis not present

## 2017-12-25 DIAGNOSIS — L97429 Non-pressure chronic ulcer of left heel and midfoot with unspecified severity: Secondary | ICD-10-CM | POA: Diagnosis not present

## 2017-12-25 DIAGNOSIS — L97222 Non-pressure chronic ulcer of left calf with fat layer exposed: Secondary | ICD-10-CM | POA: Diagnosis not present

## 2017-12-25 DIAGNOSIS — L97812 Non-pressure chronic ulcer of other part of right lower leg with fat layer exposed: Secondary | ICD-10-CM | POA: Diagnosis not present

## 2017-12-25 DIAGNOSIS — I87312 Chronic venous hypertension (idiopathic) with ulcer of left lower extremity: Secondary | ICD-10-CM | POA: Diagnosis not present

## 2017-12-25 DIAGNOSIS — M86172 Other acute osteomyelitis, left ankle and foot: Secondary | ICD-10-CM | POA: Insufficient documentation

## 2017-12-25 DIAGNOSIS — M79672 Pain in left foot: Secondary | ICD-10-CM | POA: Diagnosis not present

## 2017-12-25 DIAGNOSIS — E11621 Type 2 diabetes mellitus with foot ulcer: Secondary | ICD-10-CM | POA: Diagnosis not present

## 2017-12-25 DIAGNOSIS — L03115 Cellulitis of right lower limb: Secondary | ICD-10-CM | POA: Diagnosis not present

## 2017-12-25 DIAGNOSIS — M79605 Pain in left leg: Secondary | ICD-10-CM

## 2017-12-25 DIAGNOSIS — E11622 Type 2 diabetes mellitus with other skin ulcer: Secondary | ICD-10-CM | POA: Diagnosis not present

## 2017-12-25 DIAGNOSIS — E1140 Type 2 diabetes mellitus with diabetic neuropathy, unspecified: Secondary | ICD-10-CM | POA: Diagnosis not present

## 2017-12-25 DIAGNOSIS — E1151 Type 2 diabetes mellitus with diabetic peripheral angiopathy without gangrene: Secondary | ICD-10-CM | POA: Diagnosis not present

## 2017-12-25 DIAGNOSIS — L97819 Non-pressure chronic ulcer of other part of right lower leg with unspecified severity: Secondary | ICD-10-CM | POA: Diagnosis not present

## 2017-12-25 DIAGNOSIS — L97422 Non-pressure chronic ulcer of left heel and midfoot with fat layer exposed: Secondary | ICD-10-CM | POA: Diagnosis not present

## 2017-12-25 DIAGNOSIS — Z9119 Patient's noncompliance with other medical treatment and regimen: Secondary | ICD-10-CM | POA: Diagnosis not present

## 2017-12-25 NOTE — Progress Notes (Signed)
RLE venous duplex prelim: no obvious DVT in visualized veins. Very limited study due to body habitus. Farrel DemarkJill Eunice, RDMS, RVT Called results to Dr. Leanord Hawkingobson.

## 2017-12-26 ENCOUNTER — Encounter: Payer: Self-pay | Admitting: *Deleted

## 2017-12-29 ENCOUNTER — Ambulatory Visit (HOSPITAL_COMMUNITY): Payer: Medicare Other | Attending: Cardiovascular Disease

## 2017-12-29 ENCOUNTER — Other Ambulatory Visit: Payer: Self-pay

## 2017-12-29 DIAGNOSIS — I4891 Unspecified atrial fibrillation: Secondary | ICD-10-CM | POA: Insufficient documentation

## 2017-12-29 DIAGNOSIS — R0602 Shortness of breath: Secondary | ICD-10-CM

## 2017-12-29 DIAGNOSIS — E119 Type 2 diabetes mellitus without complications: Secondary | ICD-10-CM | POA: Insufficient documentation

## 2017-12-29 DIAGNOSIS — L97222 Non-pressure chronic ulcer of left calf with fat layer exposed: Secondary | ICD-10-CM | POA: Diagnosis not present

## 2017-12-29 DIAGNOSIS — I080 Rheumatic disorders of both mitral and aortic valves: Secondary | ICD-10-CM | POA: Diagnosis not present

## 2017-12-29 DIAGNOSIS — I890 Lymphedema, not elsewhere classified: Secondary | ICD-10-CM | POA: Diagnosis not present

## 2017-12-29 DIAGNOSIS — R600 Localized edema: Secondary | ICD-10-CM | POA: Diagnosis not present

## 2017-12-29 DIAGNOSIS — I10 Essential (primary) hypertension: Secondary | ICD-10-CM | POA: Diagnosis not present

## 2017-12-29 DIAGNOSIS — L97822 Non-pressure chronic ulcer of other part of left lower leg with fat layer exposed: Secondary | ICD-10-CM | POA: Diagnosis not present

## 2017-12-29 DIAGNOSIS — L97819 Non-pressure chronic ulcer of other part of right lower leg with unspecified severity: Secondary | ICD-10-CM | POA: Diagnosis not present

## 2017-12-29 DIAGNOSIS — R0600 Dyspnea, unspecified: Secondary | ICD-10-CM | POA: Diagnosis not present

## 2017-12-29 DIAGNOSIS — L97422 Non-pressure chronic ulcer of left heel and midfoot with fat layer exposed: Secondary | ICD-10-CM | POA: Diagnosis not present

## 2017-12-29 DIAGNOSIS — I87312 Chronic venous hypertension (idiopathic) with ulcer of left lower extremity: Secondary | ICD-10-CM | POA: Diagnosis not present

## 2017-12-29 DIAGNOSIS — L03115 Cellulitis of right lower limb: Secondary | ICD-10-CM | POA: Diagnosis not present

## 2017-12-29 DIAGNOSIS — Z9119 Patient's noncompliance with other medical treatment and regimen: Secondary | ICD-10-CM | POA: Diagnosis not present

## 2017-12-29 DIAGNOSIS — E11622 Type 2 diabetes mellitus with other skin ulcer: Secondary | ICD-10-CM | POA: Diagnosis not present

## 2017-12-29 DIAGNOSIS — I87333 Chronic venous hypertension (idiopathic) 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: Secondary | ICD-10-CM | POA: Diagnosis not present

## 2017-12-29 DIAGNOSIS — L97812 Non-pressure chronic ulcer of other part of right lower leg with fat layer exposed: Secondary | ICD-10-CM | POA: Diagnosis not present

## 2017-12-29 DIAGNOSIS — E1140 Type 2 diabetes mellitus with diabetic neuropathy, unspecified: Secondary | ICD-10-CM | POA: Diagnosis not present

## 2017-12-29 DIAGNOSIS — E1151 Type 2 diabetes mellitus with diabetic peripheral angiopathy without gangrene: Secondary | ICD-10-CM | POA: Diagnosis not present

## 2018-01-01 ENCOUNTER — Telehealth: Payer: Self-pay | Admitting: Family Medicine

## 2018-01-01 ENCOUNTER — Telehealth: Payer: Self-pay | Admitting: Internal Medicine

## 2018-01-01 DIAGNOSIS — I87333 Chronic venous hypertension (idiopathic) 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: Secondary | ICD-10-CM | POA: Diagnosis not present

## 2018-01-01 DIAGNOSIS — L97822 Non-pressure chronic ulcer of other part of left lower leg with fat layer exposed: Secondary | ICD-10-CM | POA: Diagnosis not present

## 2018-01-01 DIAGNOSIS — E11622 Type 2 diabetes mellitus with other skin ulcer: Secondary | ICD-10-CM | POA: Diagnosis not present

## 2018-01-01 DIAGNOSIS — L03115 Cellulitis of right lower limb: Secondary | ICD-10-CM | POA: Diagnosis not present

## 2018-01-01 DIAGNOSIS — L97222 Non-pressure chronic ulcer of left calf with fat layer exposed: Secondary | ICD-10-CM | POA: Diagnosis not present

## 2018-01-01 DIAGNOSIS — L97812 Non-pressure chronic ulcer of other part of right lower leg with fat layer exposed: Secondary | ICD-10-CM | POA: Diagnosis not present

## 2018-01-01 DIAGNOSIS — Z9119 Patient's noncompliance with other medical treatment and regimen: Secondary | ICD-10-CM | POA: Diagnosis not present

## 2018-01-01 DIAGNOSIS — E1140 Type 2 diabetes mellitus with diabetic neuropathy, unspecified: Secondary | ICD-10-CM | POA: Diagnosis not present

## 2018-01-01 DIAGNOSIS — I10 Essential (primary) hypertension: Secondary | ICD-10-CM | POA: Diagnosis not present

## 2018-01-01 DIAGNOSIS — L97819 Non-pressure chronic ulcer of other part of right lower leg with unspecified severity: Secondary | ICD-10-CM | POA: Diagnosis not present

## 2018-01-01 DIAGNOSIS — L97422 Non-pressure chronic ulcer of left heel and midfoot with fat layer exposed: Secondary | ICD-10-CM | POA: Diagnosis not present

## 2018-01-01 DIAGNOSIS — E1151 Type 2 diabetes mellitus with diabetic peripheral angiopathy without gangrene: Secondary | ICD-10-CM | POA: Diagnosis not present

## 2018-01-01 DIAGNOSIS — I890 Lymphedema, not elsewhere classified: Secondary | ICD-10-CM | POA: Diagnosis not present

## 2018-01-01 NOTE — Telephone Encounter (Signed)
New message   Pt returning call for nurse regarding test results

## 2018-01-01 NOTE — Telephone Encounter (Signed)
Copied from CRM [id number]. Topic: Quick Communication - See Telephone Encounter >> Jan 01, 2018  4:17 PM Terisa Starraylor, Brittany L wrote: CRM for notification. See Telephone encounter for:   01/01/18.  Pt said he just sch an appt with a nutirtionist, he said she told him he had renal disease. He wants to know is this true?  Call back is [phone]

## 2018-01-01 NOTE — Telephone Encounter (Signed)
Patient aware of echo results. Explained need for repeat BMET per MD recommendations on last labs. He states he will come by office for blood work.

## 2018-01-02 NOTE — Telephone Encounter (Signed)
He has MILD impairment of kidney function that is due to his chronic medical conditions and it is something that we follow closely with labs.  Reassure him that it is nothing he needs to worry about at this time.  His kidneys are still functioning at a level well enough that it really has no impact on him that he can feel AND it will likely NOT deteriorate to the level where it ever will. This is why I don't mention it ---b/c it often worries patient's unnecessarily.-thx

## 2018-01-02 NOTE — Telephone Encounter (Signed)
Pt advised and voiced understanding.   

## 2018-01-02 NOTE — Telephone Encounter (Signed)
SW pt to advise him that Dr. Milinda CaveMcGowen is out of the office till Monday, we will call him back once Dr. Milinda CaveMcGowen responds back. Pt voiced understanding.

## 2018-01-05 ENCOUNTER — Inpatient Hospital Stay: Payer: Medicare Other | Admitting: Infectious Diseases

## 2018-01-05 ENCOUNTER — Encounter (HOSPITAL_BASED_OUTPATIENT_CLINIC_OR_DEPARTMENT_OTHER): Payer: Medicare Other | Attending: Internal Medicine

## 2018-01-05 DIAGNOSIS — L03116 Cellulitis of left lower limb: Secondary | ICD-10-CM | POA: Diagnosis not present

## 2018-01-05 DIAGNOSIS — I87333 Chronic venous hypertension (idiopathic) 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: Secondary | ICD-10-CM | POA: Diagnosis not present

## 2018-01-05 DIAGNOSIS — E11622 Type 2 diabetes mellitus with other skin ulcer: Secondary | ICD-10-CM | POA: Insufficient documentation

## 2018-01-05 DIAGNOSIS — L97429 Non-pressure chronic ulcer of left heel and midfoot with unspecified severity: Secondary | ICD-10-CM | POA: Diagnosis not present

## 2018-01-05 DIAGNOSIS — L97222 Non-pressure chronic ulcer of left calf with fat layer exposed: Secondary | ICD-10-CM | POA: Diagnosis not present

## 2018-01-05 DIAGNOSIS — L97819 Non-pressure chronic ulcer of other part of right lower leg with unspecified severity: Secondary | ICD-10-CM | POA: Diagnosis not present

## 2018-01-05 DIAGNOSIS — L97822 Non-pressure chronic ulcer of other part of left lower leg with fat layer exposed: Secondary | ICD-10-CM | POA: Insufficient documentation

## 2018-01-05 DIAGNOSIS — I87313 Chronic venous hypertension (idiopathic) with ulcer of bilateral lower extremity: Secondary | ICD-10-CM | POA: Diagnosis not present

## 2018-01-05 DIAGNOSIS — I10 Essential (primary) hypertension: Secondary | ICD-10-CM | POA: Diagnosis not present

## 2018-01-05 DIAGNOSIS — I890 Lymphedema, not elsewhere classified: Secondary | ICD-10-CM | POA: Diagnosis not present

## 2018-01-05 DIAGNOSIS — L97422 Non-pressure chronic ulcer of left heel and midfoot with fat layer exposed: Secondary | ICD-10-CM | POA: Diagnosis not present

## 2018-01-05 DIAGNOSIS — L97812 Non-pressure chronic ulcer of other part of right lower leg with fat layer exposed: Secondary | ICD-10-CM | POA: Diagnosis not present

## 2018-01-08 ENCOUNTER — Ambulatory Visit (INDEPENDENT_AMBULATORY_CARE_PROVIDER_SITE_OTHER): Payer: Medicare Other | Admitting: Infectious Diseases

## 2018-01-08 VITALS — Wt 382.0 lb

## 2018-01-08 DIAGNOSIS — L03116 Cellulitis of left lower limb: Secondary | ICD-10-CM | POA: Diagnosis not present

## 2018-01-08 DIAGNOSIS — L03119 Cellulitis of unspecified part of limb: Secondary | ICD-10-CM

## 2018-01-08 DIAGNOSIS — I87333 Chronic venous hypertension (idiopathic) 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: Secondary | ICD-10-CM | POA: Diagnosis not present

## 2018-01-08 DIAGNOSIS — L97822 Non-pressure chronic ulcer of other part of left lower leg with fat layer exposed: Secondary | ICD-10-CM | POA: Diagnosis not present

## 2018-01-08 DIAGNOSIS — I890 Lymphedema, not elsewhere classified: Secondary | ICD-10-CM | POA: Diagnosis not present

## 2018-01-08 DIAGNOSIS — L97429 Non-pressure chronic ulcer of left heel and midfoot with unspecified severity: Secondary | ICD-10-CM | POA: Diagnosis not present

## 2018-01-08 DIAGNOSIS — I10 Essential (primary) hypertension: Secondary | ICD-10-CM | POA: Diagnosis not present

## 2018-01-08 DIAGNOSIS — L97819 Non-pressure chronic ulcer of other part of right lower leg with unspecified severity: Secondary | ICD-10-CM | POA: Diagnosis not present

## 2018-01-08 DIAGNOSIS — E11622 Type 2 diabetes mellitus with other skin ulcer: Secondary | ICD-10-CM | POA: Diagnosis not present

## 2018-01-08 DIAGNOSIS — L97222 Non-pressure chronic ulcer of left calf with fat layer exposed: Secondary | ICD-10-CM | POA: Diagnosis not present

## 2018-01-08 NOTE — Patient Instructions (Signed)
Let's stop the bicillin shots since you have not found them to be very helpful.   On the bicillin you had 2 flare ups requiring treatment over 5 months - if you notice an increase in frequency off of the bicillin I am happy to see you back and we can discuss restarting them for you.

## 2018-01-08 NOTE — Assessment & Plan Note (Signed)
Continue with modifying risk factors and lymphedema pumps.

## 2018-01-08 NOTE — Assessment & Plan Note (Addendum)
In looking through Epic records it appears that [name] has only been in to receive 2 injections of bicillin twice with last dose 11/10/2017. He is well overdue for another injection and it is not a surprise to me he had a recurrence at the mid-end of February of cellulitis that required oral antibiotics; and apparently recent flare in February 2019 it seems that the doxycycline alone failed and he required amoxicillin. Typically strep species are the culprit for cellulitis and doxy does a fairly poor job with coverage.   I personally believe the bicillin preventative injections were working for him as he had no flares through January and early February after he had received 2 doses 4 weeks apart and it was not until he delayed injection when he experienced a recurrence. He is requesting to stop injections today. He has not tolerated other attempts at oral suppressive antibiotics in the past. We decided that he will monitor frequency off preventative therapy and contact me for future appointments if needed.   For treating his cellulitis events I would recommend to stick to a Amoxicillin based regimen as a first line option +/- concurrent doxycycline if there is purulence or if amoxicillin alone provides no relief after 3 days of treatment.   I emphasized to him again today that the best chance of preventing his recurrence is modifying his risk factors - controlling his lymphedema (which appears much improved from what I recall today) and losing weight. Would encourage continued close follow up in wound clinic.

## 2018-01-08 NOTE — Progress Notes (Signed)
Name: [name]  DOB: 06/04/1951  MRN: [id number]   Chief Complaint  Patient presents with  . Follow-up    Recurrent cellulitis     Patient Active Problem List   Diagnosis Date Noted  . Non-healing ulcer of lower extremity, unspecified laterality, with unspecified severity (HCC) 12/15/2017  . Chronic diastolic heart failure (HCC) 12/24/2016  . Peripheral edema 02/22/2016  . Acute diastolic congestive heart failure (HCC) 10/27/2015  . Dyspnea 10/24/2015  . Wheezing 10/24/2015  . Acute bronchitis 10/24/2015  . 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  . Chronic headaches 05/10/2015  . Venous stasis of both lower extremities 02/14/2015  . Equivocal stress test 10/24/2014  . Atrial fibrillation (HCC) 08/15/2014  . Healthcare maintenance 08/15/2014  . Type 2 diabetes mellitus with diabetic neuropathy (HCC) 08/15/2014  . 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 Recurrent cellulitis of lower extremity 08/02/2013  . Dysuria 08/02/2013  . Lower extremity edema 11/04/2008  . Morbid obesity due to excess calories (HCC) 12/14/2007  . Venous (peripheral) insufficiency 07/22/2007  . Diabetic renal disease (HCC) 04/20/2007  . DM2 (diabetes mellitus, type 2) (HCC) 08/11/2006  . Hypertension, essential 08/11/2006  . ALLERGIC RHINITIS 08/11/2006  . ASTHMA 08/11/2006   Subjective:  Brief Narrative:  [name] Umeda is a 67 y.o. male that was referred for evaluation of his recurrent cellulitis of his lower extremities. He has [facility] March [facility]. He has a long standing history of B/L venous stasis with ulcerations, chronic lower extremity swelling / lymphadenopathy and weeping. Significant pmhx includes T2DM with insulin requirement, morbid obesity, diastolic CHF and significant swelling of his lower extremities / lymphedema. Last HgbA1C 6.4% in May 2018. ABI in April 2017 did not require intervention and LE venous dopplers  10/2016 without deep or superficial thrombus.   HPI:.  [name] feels well overall today. He is now officially retired and shared with me a plan to improve his health including enrollment in a [employer] like program and meeting with a nutritionist. He has been using his lymphedema pumps more consistently now that he is home more often. He has not been in to see me since November when we decided to start prophylactic bicillin injections q4w for cellulitis prevention. He tells me that he has required antibiotics twice since our last visit for a wound that developed on the right leg after falling out of bed. Did not want me to look at leg today as he goes to nurse visit for wound care tomorrow.   Review of Systems  Constitutional: Negative for chills, fever, malaise/fatigue and weight loss.  HENT: Negative for sore throat.   Respiratory: Negative for cough and sputum production.   Cardiovascular: Positive for leg swelling. Negative for chest pain, palpitations and claudication.  Gastrointestinal: Negative for abdominal pain, diarrhea and vomiting.  Genitourinary: Negative for dysuria and flank pain.  Musculoskeletal: Positive for joint pain.  Skin: Negative for rash.  Neurological: Negative for dizziness and headaches.    Past Medical History:  Diagnosis Date  . ALLERGIC RHINITIS 08/11/2006  . ASTHMA 08/11/2006  . Chronic atrial fibrillation (HCC) 08/2014  . Chronic combined systolic and diastolic CHF (congestive heart failure) [facility](HCC)    Cardiology f/u 12/2017: pt volume overloaded (R heart dysf suspected), BNP very high, lasix increased, echocardiogram ordered.  . Chronic constipation   . Chronic renal insufficiency, stage 2 (mild)    Borderline stage III (GFR 60s).  . DIABETES MELLITUS, TYPE II 08/11/2006  .  DM W/RENAL MANIFESTATIONS, TYPE II 04/20/2007  . HYPERTENSION 08/11/2006  . OBESITY, MORBID 12/14/2007  . 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  At f/u 92018 ID started him on amoxil prophylaxis.  Marland Kitchen Restless leg syndrome    Rx'd clonazepam 09/2017 and pt refused to take it after reading the medication's potential side effects.  . Venous stasis ulcers of both lower extremities (HCC)    wound clinic care ongoing as of 06/2017   Outpatient Medications Prior to Visit  Medication Sig Dispense Refill  . b complex vitamins capsule Take 1 capsule by mouth every morning.    . butalbital-acetaminophen-caffeine (FIORICET WITH CODEINE) 50-325-40-30 MG capsule 1-2 caps po q6h prn headache 90 capsule 1  . cetirizine (ZYRTEC) 10 MG tablet Take 10 mg by mouth daily.    . Cinnamon 500 MG TABS Take by mouth. Reported on 02/19/2016    . clotrimazole (LOTRIMIN) 1 % cream Apply 1 application topically 2 (two) times daily. To toe nails and bottom of your left foot. 30 g 3  . Coenzyme Q10 (CO Q 10) 10 MG CAPS Take by mouth. Reported on 02/19/2016    . diltiazem (TIAZAC) 360 MG 24 hr capsule TAKE 1 CAPSULE BY MOUTH  DAILY AS DIRECTED  EQUIVALENT TO TIAZAC 90 capsule 1  . furosemide (LASIX) 40 MG tablet Take 2 tablets (80 mg total) by mouth 2 (two) times daily. 360 tablet 0  . HUMALOG MIX 75/25 KWIKPEN (75-25) 100 UNIT/ML Kwikpen INJECT SUBCUTANEOUSLY 25  UNITS EVERY MORNING AND 20  UNITS EVERY EVENING AT  SUPPER 45 mL 1  . Insulin Pen Needle (BD ULTRA-FINE PEN NEEDLES) 29G X 12.7MM MISC 1 each by Other route 2 (two) times daily. 200 each 2  . Insulin Pen Needle (BD ULTRA-FINE PEN NEEDLES) 29G X 12.7MM MISC USE TWICE DAILY 200 each 3  . Insulin Pen Needle (RELION PEN NEEDLE 31G/8MM) 31G X 8 MM MISC 1 each by Does not apply route 2 (two) times daily.    Marland Kitchen lisinopril (PRINIVIL,ZESTRIL) 40 MG tablet TAKE 1 TABLET BY MOUTH  EVERY DAY 90 tablet 1  . metFORMIN (GLUCOPHAGE) 1000 MG tablet TAKE 1 TABLET BY MOUTH  TWICE A DAY WITH MEALS 180 tablet 1  . metoprolol tartrate (LOPRESSOR)  50 MG tablet TAKE 1 AND 1/2 TABLETS BY  MOUTH TWO TIMES DAILY 270 tablet 1  . Multiple Vitamin (MULTIVITAMIN) tablet Take 1 tablet by mouth daily.    . Nutritional Supplements (GRAPESEED EXTRACT PO) Take 1 capsule by mouth daily.    . ONE TOUCH ULTRA TEST test strip CHECK BLOOD SUGAR TWO TIMES DAILY 100 each 11  . saw palmetto 500 MG capsule Take 500 mg by mouth daily.    Marland Kitchen terazosin (HYTRIN) 10 MG capsule TAKE 1 CAPSULE BY MOUTH  ONCE A DAY AT BEDTIME 90 capsule 1  . vitamin E 400 UNIT capsule Take 400 Units by mouth every morning. Selenium 50mg     . XARELTO 20 MG TABS tablet TAKE 1 TABLET BY MOUTH ONCE DAILY WITH SUPPER 90 tablet 1   No facility-administered medications prior to visit.    No Known Allergies  Physical Exam and Objective Findings: Vitals:   01/08/18 0845  Weight: (!) 382 lb (173.3 kg)   Body mass index is 54.81 kg/m.  Physical Exam  Constitutional: He is oriented to person, place, and time.  Non-toxic appearance.  Pleasant, obese gentleman with bilateral legs wrapped with unna boots.  Eyes: No scleral icterus.  Neck: Neck supple. No JVD present.  Cardiovascular: Normal rate, regular rhythm and intact distal pulses.  No murmur heard. Pulmonary/Chest: Effort normal and breath sounds normal. He has no wheezes. He has no rales.  Abdominal: Soft. He exhibits no distension. There is no tenderness.  Musculoskeletal: He exhibits edema. He exhibits no tenderness.  Ambulates with cane assistance  Neurological: He is alert and oriented to person, place, and time.  Skin: Skin is warm and dry.  Psychiatric: Affect normal.  Both legs are wrapped and he does not want to break down dressings.   ASSESSMENT & PLAN:  Problem List Items Addressed This Visit      Other   Recurrent cellulitis of lower extremity - Primary    In looking through Epic records it appears that Mr. Mabey has only been in to receive 2 injections of bicillin twice with last dose 11/10/2017. He is well  overdue for another injection and it is not a surprise to me he had a recurrence at the mid-end of February of cellulitis that required oral antibiotics; and apparently recent flare in February 2019 it seems that the doxycycline alone failed and he required amoxicillin. Typically strep species are the culprit for cellulitis and doxy does a fairly poor job with coverage.   I personally believe the bicillin preventative injections were working for him as he had no flares through January and early February after he had received 2 doses 4 weeks apart and it was not until he delayed injection when he experienced a recurrence. He is requesting to stop injections today. He has not tolerated other attempts at oral suppressive antibiotics in the past. We decided that he will monitor frequency off preventative therapy and contact me for future appointments if needed.   For treating his cellulitis events I would recommend to stick to a Amoxicillin based regimen as a first line option +/- concurrent doxycycline if there is purulence or if amoxicillin alone provides no relief after 3 days of treatment.   I emphasized to him again today that the best chance of preventing his recurrence is modifying his risk factors - controlling his lymphedema (which appears much improved from what I recall today) and losing weight. Would encourage continued close follow up in wound clinic.          Rexene Alberts, MSN, [facility]  01/08/2018 4:09 PM

## 2018-01-10 ENCOUNTER — Other Ambulatory Visit: Payer: Self-pay | Admitting: Family Medicine

## 2018-01-12 ENCOUNTER — Telehealth: Payer: Self-pay | Admitting: Internal Medicine

## 2018-01-12 ENCOUNTER — Ambulatory Visit: Payer: Medicare Other | Admitting: Dietician

## 2018-01-12 DIAGNOSIS — L97819 Non-pressure chronic ulcer of other part of right lower leg with unspecified severity: Secondary | ICD-10-CM | POA: Diagnosis not present

## 2018-01-12 DIAGNOSIS — I10 Essential (primary) hypertension: Secondary | ICD-10-CM | POA: Diagnosis not present

## 2018-01-12 DIAGNOSIS — L97222 Non-pressure chronic ulcer of left calf with fat layer exposed: Secondary | ICD-10-CM | POA: Diagnosis not present

## 2018-01-12 DIAGNOSIS — S81802A Unspecified open wound, left lower leg, initial encounter: Secondary | ICD-10-CM | POA: Diagnosis not present

## 2018-01-12 DIAGNOSIS — E11622 Type 2 diabetes mellitus with other skin ulcer: Secondary | ICD-10-CM | POA: Diagnosis not present

## 2018-01-12 DIAGNOSIS — I87333 Chronic venous hypertension (idiopathic) 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: Secondary | ICD-10-CM | POA: Diagnosis not present

## 2018-01-12 DIAGNOSIS — L03116 Cellulitis of left lower limb: Secondary | ICD-10-CM | POA: Diagnosis not present

## 2018-01-12 DIAGNOSIS — I890 Lymphedema, not elsewhere classified: Secondary | ICD-10-CM | POA: Diagnosis not present

## 2018-01-12 DIAGNOSIS — L97429 Non-pressure chronic ulcer of left heel and midfoot with unspecified severity: Secondary | ICD-10-CM | POA: Diagnosis not present

## 2018-01-12 DIAGNOSIS — I5032 Chronic diastolic (congestive) heart failure: Secondary | ICD-10-CM

## 2018-01-12 DIAGNOSIS — L97822 Non-pressure chronic ulcer of other part of left lower leg with fat layer exposed: Secondary | ICD-10-CM | POA: Diagnosis not present

## 2018-01-12 DIAGNOSIS — S81801A Unspecified open wound, right lower leg, initial encounter: Secondary | ICD-10-CM | POA: Diagnosis not present

## 2018-01-12 DIAGNOSIS — S91302A Unspecified open wound, left foot, initial encounter: Secondary | ICD-10-CM | POA: Diagnosis not present

## 2018-01-12 NOTE — Telephone Encounter (Signed)
New Message:    Pt wants to know if he could have his lab work at Dr MedtronicMcGowan's office please?.It is just easier for him to do it there.

## 2018-01-12 NOTE — Telephone Encounter (Signed)
Returned call to patient he stated he wanted to have bmet done at Dr.McKeon's office tomorrow.Order faxed to Dr.McKeown at fax # [phone].

## 2018-01-13 ENCOUNTER — Encounter: Payer: Medicare Other | Attending: Family Medicine | Admitting: Registered"

## 2018-01-13 ENCOUNTER — Encounter: Payer: Self-pay | Admitting: Registered"

## 2018-01-13 DIAGNOSIS — E118 Type 2 diabetes mellitus with unspecified complications: Secondary | ICD-10-CM | POA: Diagnosis not present

## 2018-01-13 DIAGNOSIS — E11622 Type 2 diabetes mellitus with other skin ulcer: Secondary | ICD-10-CM | POA: Diagnosis not present

## 2018-01-13 DIAGNOSIS — L97822 Non-pressure chronic ulcer of other part of left lower leg with fat layer exposed: Secondary | ICD-10-CM | POA: Diagnosis not present

## 2018-01-13 DIAGNOSIS — L97429 Non-pressure chronic ulcer of left heel and midfoot with unspecified severity: Secondary | ICD-10-CM | POA: Diagnosis not present

## 2018-01-13 DIAGNOSIS — L97222 Non-pressure chronic ulcer of left calf with fat layer exposed: Secondary | ICD-10-CM | POA: Diagnosis not present

## 2018-01-13 DIAGNOSIS — I87333 Chronic venous hypertension (idiopathic) 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: Secondary | ICD-10-CM | POA: Diagnosis not present

## 2018-01-13 DIAGNOSIS — L03116 Cellulitis of left lower limb: Secondary | ICD-10-CM | POA: Diagnosis not present

## 2018-01-13 DIAGNOSIS — L97819 Non-pressure chronic ulcer of other part of right lower leg with unspecified severity: Secondary | ICD-10-CM | POA: Diagnosis not present

## 2018-01-13 DIAGNOSIS — Z713 Dietary counseling and surveillance: Secondary | ICD-10-CM | POA: Insufficient documentation

## 2018-01-13 DIAGNOSIS — E0840 Diabetes mellitus due to underlying condition with diabetic neuropathy, unspecified: Secondary | ICD-10-CM

## 2018-01-13 DIAGNOSIS — I10 Essential (primary) hypertension: Secondary | ICD-10-CM | POA: Diagnosis not present

## 2018-01-13 DIAGNOSIS — I890 Lymphedema, not elsewhere classified: Secondary | ICD-10-CM | POA: Diagnosis not present

## 2018-01-13 NOTE — Progress Notes (Signed)
Diabetes Self-Management Education  Visit Type: First/Initial  Appt. Start Time: 1400 Appt. End Time: 1530  01/16/2018  [name], identified by name and date of birth, is a 67 y.o. male with a diagnosis of Diabetes: Type 2.   ASSESSMENT Patient states he thinks he has low blood sugar when he gets visual symptoms of  blue dots which he describes in a pattern that resembles a QR scan code. Patient states when he feels this way his BG is ~125 mg/dL and he will drink a 12 oz soda or eats a raisins to feel better, and states he does not recheck his BG. Patient reported post meal BG sounds close to range, per patient occasion goes over 200 but 2 weeks ago once it hit 365 mg/dL but doesn't remember what he ate prior to that.  Patient states he has a hard time sleeping, gets about 4-6 hrs at night and will nap during the day 14 min - 2 hrs. Patient states he gets tired after eating.  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  Diabetes Self-Management Education - 01/13/18 1407      Visit Information   Visit Type  First/Initial      Initial Visit   Diabetes Type  Type 2    Are you currently following a meal plan?  No    Are you taking your medications as prescribed?  Yes    Date Diagnosed  1998      Health Coping   How would you rate your overall health?  Fair      Psychosocial Assessment   Patient Belief/Attitude about Diabetes  Afraid    How often do you need to have someone help you when you read instructions, pamphlets, or other written materials from your doctor or pharmacy?  1 - Never    What is the last grade level you completed in school?  12th      Complications   Last HgB A1C per  patient/outside source  6.9 %    How often do you check your blood sugar?  3-4 times/day    Fasting Blood glucose range (mg/dL)  16-109;604-540;981-191 125-201    Number of hypoglycemic episodes per month  3    Can you tell when your blood sugar is low?  Yes    What do you do if your blood sugar is low?  3-4x month gets symptoms but never goes below 70, gets symp    Number of hyperglycemic episodes per week  0    Have you had a dilated eye exam in the past 12 months?  Yes    Have you had a dental exam in the past 12 months?  No    Are you checking your feet?  Yes    How many days per week are you checking your feet?  2      Dietary Intake   Breakfast  eggo waffle, reg syrup, butter, unsweet green tea    Snack (morning)  not since he stopped working    [employer], small fry, sm reg coke    Snack (afternoon)  none    [employer], milk  Snack (evening)  fruit cups OR ice cream    Beverage(s)  milk, water, reg soda, coke zero, green tea      Exercise   Exercise Type  ADL's    How many days per week to you exercise?  0    How many minutes per day do you exercise?  0    Total minutes per week of exercise  0      Patient Education   Disease state   Definition of diabetes, type 1 and 2, and the diagnosis of diabetes    Nutrition management   Role of diet in the treatment of diabetes and the relationship between the three main macronutrients and blood glucose level    Medications  Reviewed patients medication for diabetes, action, purpose, timing of dose and side effects.    Monitoring  Identified appropriate SMBG and/or A1C goals.    Acute complications  Taught treatment of hypoglycemia - the 15 rule.    Psychosocial adjustment  Role of stress on diabetes      Individualized Goals (developed by patient)   Nutrition  General guidelines for healthy choices and portions discussed      Outcomes   Expected Outcomes  Demonstrated interest in learning. Expect  positive outcomes    Future DMSE  4-6 wks    Program Status  Not Completed     Individualized Plan for Diabetes Self-Management Training:   Learning Objective:  Patient will have a greater understanding of diabetes self-management. Patient education plan is to attend individual and/or group sessions per assessed needs and concerns.   Patient Instructions  Aim to eat balanced meals and snacks and get protein when eating carbs. For yogurt read the nutrition facts and look for ones that have about 1/2 the number of protein grams (or more) to grams of carbohydrates. Use the breakfast cereal handout for comparing products. Tell you eye doctor about your QR visual events Consider having diet soda rather than regular soda Consider using less soda, keep to about 15 g, to treat low blood sugar You can try Calm magnesium supplement to help with sleep Website for Living Well With Diabetes book online: http://diabetes.ada-ksw.com Consider downloading an app that will sync with your glucometer  Expected Outcomes:  Demonstrated interest in learning. Expect positive outcomes  Education material provided: A1C conversion sheet and My Plate, Sleep Hygiene, Breakfast Ideas, [employer] healthy meal ides, hypo-hyperglycemia Symptom Overlap   If problems or questions, patient to contact team via:  Phone and MyChart  Future DSME appointment: 4-6 wks

## 2018-01-13 NOTE — Patient Instructions (Signed)
Aim to eat balanced meals and snacks and get protein when eating carbs. For yogurt read the nutrition facts and look for ones that have about 1/2 the number of protein grams (or more) to grams of carbohydrates. Use the breakfast cereal handout for comparing products. Tell you eye doctor about your QR visual events Consider having diet soda rather than regular soda Consider using less soda, keep to about 15 g, to treat low blood sugar You can try Calm magnesium supplement to help with sleep Website for Living Well With Diabetes book online: http://diabetes.ada-ksw.com Consider downloading an app that will sync with your glucometer

## 2018-01-14 DIAGNOSIS — I10 Essential (primary) hypertension: Secondary | ICD-10-CM | POA: Diagnosis not present

## 2018-01-14 DIAGNOSIS — I5032 Chronic diastolic (congestive) heart failure: Secondary | ICD-10-CM | POA: Diagnosis not present

## 2018-01-14 LAB — BASIC METABOLIC PANEL
BUN / CREAT RATIO: 14 (ref 10–24)
BUN: 18 mg/dL (ref 8–27)
CHLORIDE: 102 mmol/L (ref 96–106)
CO2: 25 mmol/L (ref 20–29)
Calcium: 8.7 mg/dL (ref 8.6–10.2)
Creatinine, Ser: 1.32 mg/dL — ABNORMAL HIGH (ref 0.76–1.27)
GFR calc non Af Amer: 56 mL/min/{1.73_m2} — ABNORMAL LOW (ref >59)
GFR, EST AFRICAN AMERICAN: 65 mL/min/{1.73_m2} (ref >59)
Glucose: 123 mg/dL — ABNORMAL HIGH (ref 65–99)
POTASSIUM: 4.8 mmol/L (ref 3.5–5.2)
SODIUM: 142 mmol/L (ref 134–144)

## 2018-01-16 ENCOUNTER — Encounter: Payer: Self-pay | Admitting: Internal Medicine

## 2018-01-16 ENCOUNTER — Ambulatory Visit: Payer: Medicare Other | Admitting: Internal Medicine

## 2018-01-16 VITALS — BP 135/68 | HR 52 | Ht 70.0 in | Wt 388.6 lb

## 2018-01-16 DIAGNOSIS — R600 Localized edema: Secondary | ICD-10-CM

## 2018-01-16 DIAGNOSIS — I50813 Acute on chronic right heart failure: Secondary | ICD-10-CM | POA: Insufficient documentation

## 2018-01-16 DIAGNOSIS — I10 Essential (primary) hypertension: Secondary | ICD-10-CM | POA: Diagnosis not present

## 2018-01-16 NOTE — Patient Instructions (Signed)
Your physician wants you to follow-up in: 6 months with Dr. Hilty. You will receive a reminder letter in the mail two months in advance. If you don't receive a letter, please call our office to schedule the follow-up appointment.    

## 2018-01-16 NOTE — Progress Notes (Signed)
OFFICE NOTE  Chief Complaint:  Edema has improved  Primary Care Physician: [name], MD  HPI:  [name] is a 67 year old male who is currently referred by Dr. Milinda Cave for new onset atrial flutter/fibrillation. [name] was at his office for a routine physical and underwent an EKG. This demonstrated either coarse A. Fib or atrial flutter with variable ventricular response and a rate around 80. It was noted that he was already on metoprolol tartrate 75 mg twice daily for hypertension. 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  He is reportedly unaware of his atrial fibrillation. Fortunately an EKG in the office today shows that he is back in a sinus rhythm or sinus arrhythmia. There is no family history of coronary disease or A. Fib. He denies any chest pain or worsening shortness of breath, but has been having problems with sciatic pain in his back and difficulty ambulating. He reports he gets good sleep at night, rarely wakes up, does not have morning headaches or the need for daytime napping. His screening EPWSS was only 3, suggesting that obstructive sleep apnea is actually fairly unlikely, despite his significant weight.  [name] returns today for follow-up. He underwent a nuclear stress test which was a 2 day study. This demonstrated significant TID 1.48, however it is unclear how reliable this could be because of the fact the study was done on 2 different days. The test was negative for ischemia. EF was calculated at 42%, which is lower than his EF was thought to be at 50%. He denies any chest pain, ever. He also has stable shortness of breath but no worsening symptoms. He reportedly was unaware of A. fib and still has not had any symptoms. He is currently on Xarelto and is not having any bleeding problems.  [name] returns today for follow-up. He reports  some recent worsening of shortness of breath on exertion. He saw Dr. Milinda Cave for this and had some increase in his Lasix. Up to 80 mg daily. He is currently taking 40 mg daily does not notice significant difference in his symptoms. He had a recent echocardiogram which shows an improvement in LV function up to 55-60%. He feels some of this is due to excess weight. He's been less active. He is now close to 400 pounds. Has pain. He has recurrent atrial fibrillation and is in A. fib today to rate is 78. This is been paroxysmal in the past however may be more persistent. He is not aware of his A. fib. He is on Xarelto and is been compliant with that medication. He denies any bleeding problems.   12/24/2016  [name] returns to for follow-up. Initially blood pressure was elevated 166/60 became and 132/64. He has managed to lose about 15 pounds which I commended him on. He denies any chest pain or worsening shortness of breath. He remains in atrial fibrillation with controlled ventricular response of 61. He denies any bleeding problems on Xarelto.  12/15/2017  [name] returns today for annual follow-up.  He has been struggling with lower extremity wound infections.  He is undergoing penicillin injections and wound care management.  He denies any chest pain or worsening shortness of breath however has had about a 12 pound weight gain since I last saw him a year ago.  He says he is going to be working on dietary changes however he is  noted significant swelling of his legs.  There was a suggestion that he may be volume overloaded and he does report some symptoms that sound consistent with orthopnea.  He last had an echo which showed systolic function and 2017.  01/16/2018  [name] was seen today in follow-up.  He reports improvement in his edema.  I increased his Lasix to 80 mg twice daily.  He is lost about 4 pounds since I last saw him.  He denies any worsening shortness of breath.  He continues to struggle  with lower extremity wounds that are slowly healing.  Weight is a significant issue.  An echo was performed which showed normal systolic function and mild RV dysfunction.  Most of his symptoms are likely attributable to that.  PMHx:  Past Medical History:  Diagnosis Date  . ALLERGIC RHINITIS 08/11/2006  . ASTHMA 08/11/2006  . Chronic atrial fibrillation (HCC) 08/2014  . Chronic combined systolic and diastolic CHF (congestive heart failure) [facility](HCC)    Cardiology f/u 12/2017: pt volume overloaded (R heart dysf suspected), BNP very high, lasix increased, echocardiogram ordered.  . Chronic constipation   . Chronic renal insufficiency, stage 2 (mild)    Borderline stage III (GFR 60s).  . DIABETES MELLITUS, TYPE II 08/11/2006  . DM W/RENAL MANIFESTATIONS, TYPE II 04/20/2007  . HYPERTENSION 08/11/2006  . OBESITY, MORBID 12/14/2007  . 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  At f/u 92018 ID started him on amoxil prophylaxis.  Marland Kitchen. Restless leg syndrome    Rx'd clonazepam 09/2017 and pt refused to take it after reading the medication's potential side effects.  . Venous stasis ulcers of both lower extremities (HCC)    wound clinic care ongoing as of 06/2017    Past Surgical History:  Procedure Laterality Date  . TRANSTHORACIC ECHOCARDIOGRAM  11/2007; 09/2014; 11/2015   LV fxn normal, EF normal, mild dilation of left atrium.  2015 grade II diast dysfxn.  2017 EF 55-60%.  Marland Kitchen. URETERAL STENT PLACEMENT    . virtual colonoscopy  01/2011   Normal    FAMHx:  Family History  Problem Relation Age of Onset  . Diabetes Mother   . Diabetes Sister   . Hydrocephalus Sister        NPH    SOCHx:   reports that  has never smoked. he has never used smokeless tobacco. He reports that he does not drink alcohol or use drugs.  ALLERGIES:  No Known Allergies  ROS: Pertinent items noted in HPI and  remainder of comprehensive ROS otherwise negative.  HOME MEDS: Current Outpatient Medications  Medication Sig Dispense Refill  . b complex vitamins capsule Take 1 capsule by mouth every morning.    . butalbital-acetaminophen-caffeine (FIORICET WITH CODEINE) 50-325-40-30 MG capsule 1-2 caps po q6h prn headache 90 capsule 1  . cetirizine (ZYRTEC) 10 MG tablet Take 10 mg by mouth daily.    . Cinnamon 500 MG TABS Take by mouth. Reported on 02/19/2016    . clotrimazole (LOTRIMIN) 1 % cream Apply 1 application topically 2 (two) times daily. To toe nails and bottom of your left foot. 30 g 3  . Coenzyme Q10 (CO Q 10) 10 MG CAPS Take by mouth. Reported on 02/19/2016    . diltiazem (TIAZAC) 360 MG 24 hr capsule TAKE 1 CAPSULE BY MOUTH  DAILY AS DIRECTED  EQUIVALENT TO TIAZAC 90 capsule 1  . furosemide (  LASIX) 40 MG tablet Take 2 tablets (80 mg total) by mouth 2 (two) times daily. 360 tablet 0  . HUMALOG MIX 75/25 KWIKPEN (75-25) 100 UNIT/ML Kwikpen INJECT SUBCUTANEOUSLY 25  UNITS EVERY MORNING AND 20  UNITS EVERY EVENING AT  SUPPER 45 mL 1  . Insulin Pen Needle (BD ULTRA-FINE PEN NEEDLES) 29G X 12.7MM MISC 1 each by Other route 2 (two) times daily. 200 each 2  . Insulin Pen Needle (BD ULTRA-FINE PEN NEEDLES) 29G X 12.7MM MISC USE TWICE DAILY 200 each 3  . Insulin Pen Needle (RELION PEN NEEDLE 31G/8MM) 31G X 8 MM MISC 1 each by Does not apply route 2 (two) times daily.    Marland Kitchen lisinopril (PRINIVIL,ZESTRIL) 40 MG tablet TAKE 1 TABLET BY MOUTH  EVERY DAY 90 tablet 1  . metFORMIN (GLUCOPHAGE) 1000 MG tablet TAKE 1 TABLET BY MOUTH  TWICE A DAY WITH MEALS 180 tablet 1  . metoprolol tartrate (LOPRESSOR) 50 MG tablet TAKE 1 AND 1/2 TABLETS BY  MOUTH TWO TIMES DAILY 270 tablet 1  . Multiple Vitamin (MULTIVITAMIN) tablet Take 1 tablet by mouth daily.    . Nutritional Supplements (GRAPESEED EXTRACT PO) Take 1 capsule by mouth daily.    . ONE TOUCH ULTRA TEST test strip CHECK BLOOD SUGAR TWO TIMES DAILY 100 each 11  .  saw palmetto 500 MG capsule Take 500 mg by mouth daily.    Marland Kitchen terazosin (HYTRIN) 10 MG capsule TAKE 1 CAPSULE BY MOUTH  ONCE A DAY AT BEDTIME 90 capsule 1  . vitamin E 400 UNIT capsule Take 400 Units by mouth every morning. Selenium 50mg     . XARELTO 20 MG TABS tablet TAKE 1 TABLET BY MOUTH ONCE DAILY WITH SUPPER 90 tablet 1   No current facility-administered medications for this visit.     LABS/IMAGING: Results for orders placed or performed in visit on 01/12/18 (from the past 48 hour(s))  Basic metabolic panel     Status: Abnormal   Collection Time: 01/14/18 11:47 AM  Result Value Ref Range   Glucose 123 (H) 65 - 99 mg/dL   BUN 18 8 - 27 mg/dL   Creatinine, Ser 2.13 (H) 0.76 - 1.27 mg/dL   GFR calc non Af Amer 56 (L) >59 mL/min/1.73   GFR calc Af Amer 65 >59 mL/min/1.73   BUN/Creatinine Ratio 14 10 - 24   Sodium 142 134 - 144 mmol/L   Potassium 4.8 3.5 - 5.2 mmol/L   Chloride 102 96 - 106 mmol/L   CO2 25 20 - 29 mmol/L   Calcium 8.7 8.6 - 10.2 mg/dL   No results found.  VITALS: BP 135/68   Pulse (!) 52   Ht 5\' 10"  (1.778 m)   Wt (!) 388 lb 9.6 oz (176.3 kg)   SpO2 (!) 89%   BMI 55.76 kg/m   EXAM: Extremities: edema 2+ bilateral edema, the legs are wrapped  EKG: Deferred  ASSESSMENT: 1. Acute on chronic combined systolic and diastolic heart failure (LVEF 55-60% in 11/2015) 2. PAF-CHADSVASC score of 3 3. Hypertension 4. Morbid obesity 5. 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 Dyslipidemia  PLAN: 1.   Mr. Lemmerman has acute congestive heart failure.  BNP was noted to be over 2000.  His echo showed normal systolic function, mild diastolic dysfunction and more notable RV dysfunction.  This is likely the cause of his symptoms, and I suspect related to morbid obesity and upper airway resistance syndrome.  He has diuresed  with a mild increase in creatinine but is about 4 pounds lighter.  His edema is still significant.  I would remain on his increased  dose of Lasix.  If his edema persists, he may need low-dose metolazone to assist in diuresis.  Follow-up with me in 6 months.  [name], MD, [facility], FACP  [place]  [facility]  Medical Director of the [facility] Lipid Disorders &  Cardiovascular Risk Reduction Clinic Diplomate of the American Board of Clinical Lipidology Attending Cardiologist  Direct Dial: [phone]  Fax: [phone]  Website:  [email or web address] 01/16/2018, 11:35 AM

## 2018-01-19 ENCOUNTER — Encounter: Payer: Self-pay | Admitting: Family Medicine

## 2018-01-19 DIAGNOSIS — L03116 Cellulitis of left lower limb: Secondary | ICD-10-CM | POA: Diagnosis not present

## 2018-01-19 DIAGNOSIS — I10 Essential (primary) hypertension: Secondary | ICD-10-CM | POA: Diagnosis not present

## 2018-01-19 DIAGNOSIS — E11622 Type 2 diabetes mellitus with other skin ulcer: Secondary | ICD-10-CM | POA: Diagnosis not present

## 2018-01-19 DIAGNOSIS — L97822 Non-pressure chronic ulcer of other part of left lower leg with fat layer exposed: Secondary | ICD-10-CM | POA: Diagnosis not present

## 2018-01-19 DIAGNOSIS — L97819 Non-pressure chronic ulcer of other part of right lower leg with unspecified severity: Secondary | ICD-10-CM | POA: Diagnosis not present

## 2018-01-19 DIAGNOSIS — I890 Lymphedema, not elsewhere classified: Secondary | ICD-10-CM | POA: Diagnosis not present

## 2018-01-19 DIAGNOSIS — I87313 Chronic venous hypertension (idiopathic) with ulcer of bilateral lower extremity: Secondary | ICD-10-CM | POA: Diagnosis not present

## 2018-01-19 DIAGNOSIS — L97429 Non-pressure chronic ulcer of left heel and midfoot with unspecified severity: Secondary | ICD-10-CM | POA: Diagnosis not present

## 2018-01-19 DIAGNOSIS — I87333 Chronic venous hypertension (idiopathic) 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: Secondary | ICD-10-CM | POA: Diagnosis not present

## 2018-01-19 DIAGNOSIS — L97229 Non-pressure chronic ulcer of left calf with unspecified severity: Secondary | ICD-10-CM | POA: Diagnosis not present

## 2018-01-19 DIAGNOSIS — L97222 Non-pressure chronic ulcer of left calf with fat layer exposed: Secondary | ICD-10-CM | POA: Diagnosis not present

## 2018-01-20 DIAGNOSIS — L97819 Non-pressure chronic ulcer of other part of right lower leg with unspecified severity: Secondary | ICD-10-CM | POA: Diagnosis not present

## 2018-01-20 DIAGNOSIS — L97429 Non-pressure chronic ulcer of left heel and midfoot with unspecified severity: Secondary | ICD-10-CM | POA: Diagnosis not present

## 2018-01-20 DIAGNOSIS — L03116 Cellulitis of left lower limb: Secondary | ICD-10-CM | POA: Diagnosis not present

## 2018-01-20 DIAGNOSIS — I87312 Chronic venous hypertension (idiopathic) with ulcer of left lower extremity: Secondary | ICD-10-CM | POA: Diagnosis not present

## 2018-01-20 DIAGNOSIS — L97822 Non-pressure chronic ulcer of other part of left lower leg with fat layer exposed: Secondary | ICD-10-CM | POA: Diagnosis not present

## 2018-01-20 DIAGNOSIS — L97222 Non-pressure chronic ulcer of left calf with fat layer exposed: Secondary | ICD-10-CM | POA: Diagnosis not present

## 2018-01-20 DIAGNOSIS — E11622 Type 2 diabetes mellitus with other skin ulcer: Secondary | ICD-10-CM | POA: Diagnosis not present

## 2018-01-20 DIAGNOSIS — I10 Essential (primary) hypertension: Secondary | ICD-10-CM | POA: Diagnosis not present

## 2018-01-20 DIAGNOSIS — I890 Lymphedema, not elsewhere classified: Secondary | ICD-10-CM | POA: Diagnosis not present

## 2018-01-20 DIAGNOSIS — I87333 Chronic venous hypertension (idiopathic) 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: Secondary | ICD-10-CM | POA: Diagnosis not present

## 2018-01-22 ENCOUNTER — Telehealth: Payer: Self-pay | Admitting: Family Medicine

## 2018-01-22 MED ORDER — GLUCOSE BLOOD VI STRP: ORAL_STRIP | 11 refills | Status: DC

## 2018-01-22 NOTE — Telephone Encounter (Signed)
Copied from CRM [id number]. Topic: Quick Communication - Rx Refill/Question >> Jan 22, 2018  4:01 PM Arlyss Gandyichardson, Yunique Dearcos N, NT wrote: Medication: ONE TOUCH ULTRA TEST test strip  Has the patient contacted their pharmacy?  (Agent: If no, request that the patient contact the pharmacy for the refill.) Preferred Pharmacy (with phone number or street name): [facility] in [place] Agent: Please be advised that RX refills may take up to 3 business days. We ask that you follow-up with your pharmacy.

## 2018-01-26 DIAGNOSIS — E11622 Type 2 diabetes mellitus with other skin ulcer: Secondary | ICD-10-CM | POA: Diagnosis not present

## 2018-01-26 DIAGNOSIS — L03116 Cellulitis of left lower limb: Secondary | ICD-10-CM | POA: Diagnosis not present

## 2018-01-26 DIAGNOSIS — I10 Essential (primary) hypertension: Secondary | ICD-10-CM | POA: Diagnosis not present

## 2018-01-26 DIAGNOSIS — L97822 Non-pressure chronic ulcer of other part of left lower leg with fat layer exposed: Secondary | ICD-10-CM | POA: Diagnosis not present

## 2018-01-26 DIAGNOSIS — I890 Lymphedema, not elsewhere classified: Secondary | ICD-10-CM | POA: Diagnosis not present

## 2018-01-26 DIAGNOSIS — L97429 Non-pressure chronic ulcer of left heel and midfoot with unspecified severity: Secondary | ICD-10-CM | POA: Diagnosis not present

## 2018-01-26 DIAGNOSIS — I87332 Chronic venous hypertension (idiopathic) with ulcer and inflammation of left lower extremity: Secondary | ICD-10-CM | POA: Diagnosis not present

## 2018-01-26 DIAGNOSIS — L97222 Non-pressure chronic ulcer of left calf with fat layer exposed: Secondary | ICD-10-CM | POA: Diagnosis not present

## 2018-01-26 DIAGNOSIS — L97819 Non-pressure chronic ulcer of other part of right lower leg with unspecified severity: Secondary | ICD-10-CM | POA: Diagnosis not present

## 2018-01-26 DIAGNOSIS — I87333 Chronic venous hypertension (idiopathic) 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: Secondary | ICD-10-CM | POA: Diagnosis not present

## 2018-01-30 DIAGNOSIS — I87332 Chronic venous hypertension (idiopathic) with ulcer and inflammation of left lower extremity: Secondary | ICD-10-CM | POA: Diagnosis not present

## 2018-01-30 DIAGNOSIS — L03116 Cellulitis of left lower limb: Secondary | ICD-10-CM | POA: Diagnosis not present

## 2018-01-30 DIAGNOSIS — I10 Essential (primary) hypertension: Secondary | ICD-10-CM | POA: Diagnosis not present

## 2018-01-30 DIAGNOSIS — L97429 Non-pressure chronic ulcer of left heel and midfoot with unspecified severity: Secondary | ICD-10-CM | POA: Diagnosis not present

## 2018-01-30 DIAGNOSIS — I87333 Chronic venous hypertension (idiopathic) 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: Secondary | ICD-10-CM | POA: Diagnosis not present

## 2018-01-30 DIAGNOSIS — L97822 Non-pressure chronic ulcer of other part of left lower leg with fat layer exposed: Secondary | ICD-10-CM | POA: Diagnosis not present

## 2018-01-30 DIAGNOSIS — E11622 Type 2 diabetes mellitus with other skin ulcer: Secondary | ICD-10-CM | POA: Diagnosis not present

## 2018-01-30 DIAGNOSIS — L97222 Non-pressure chronic ulcer of left calf with fat layer exposed: Secondary | ICD-10-CM | POA: Diagnosis not present

## 2018-01-30 DIAGNOSIS — L97819 Non-pressure chronic ulcer of other part of right lower leg with unspecified severity: Secondary | ICD-10-CM | POA: Diagnosis not present

## 2018-01-30 DIAGNOSIS — I890 Lymphedema, not elsewhere classified: Secondary | ICD-10-CM | POA: Diagnosis not present

## 2018-02-09 ENCOUNTER — Encounter (HOSPITAL_BASED_OUTPATIENT_CLINIC_OR_DEPARTMENT_OTHER): Payer: Medicare Other | Attending: Internal Medicine

## 2018-02-09 DIAGNOSIS — L97829 Non-pressure chronic ulcer of other part of left lower leg with unspecified severity: Secondary | ICD-10-CM | POA: Insufficient documentation

## 2018-02-09 DIAGNOSIS — I87333 Chronic venous hypertension (idiopathic) 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: Secondary | ICD-10-CM | POA: Insufficient documentation

## 2018-02-09 DIAGNOSIS — L97522 Non-pressure chronic ulcer of other part of left foot with fat layer exposed: Secondary | ICD-10-CM | POA: Insufficient documentation

## 2018-02-09 DIAGNOSIS — L97822 Non-pressure chronic ulcer of other part of left lower leg with fat layer exposed: Secondary | ICD-10-CM | POA: Diagnosis not present

## 2018-02-09 DIAGNOSIS — L97819 Non-pressure chronic ulcer of other part of right lower leg with unspecified severity: Secondary | ICD-10-CM | POA: Insufficient documentation

## 2018-02-09 DIAGNOSIS — I482 Chronic atrial fibrillation: Secondary | ICD-10-CM | POA: Insufficient documentation

## 2018-02-09 DIAGNOSIS — I890 Lymphedema, not elsewhere classified: Secondary | ICD-10-CM | POA: Insufficient documentation

## 2018-02-09 DIAGNOSIS — I10 Essential (primary) hypertension: Secondary | ICD-10-CM | POA: Insufficient documentation

## 2018-02-09 DIAGNOSIS — E1140 Type 2 diabetes mellitus with diabetic neuropathy, unspecified: Secondary | ICD-10-CM | POA: Diagnosis not present

## 2018-02-09 DIAGNOSIS — E11622 Type 2 diabetes mellitus with other skin ulcer: Secondary | ICD-10-CM | POA: Insufficient documentation

## 2018-02-09 DIAGNOSIS — L03116 Cellulitis of left lower limb: Secondary | ICD-10-CM | POA: Diagnosis not present

## 2018-02-09 DIAGNOSIS — L97222 Non-pressure chronic ulcer of left calf with fat layer exposed: Secondary | ICD-10-CM | POA: Diagnosis not present

## 2018-02-09 DIAGNOSIS — E1151 Type 2 diabetes mellitus with diabetic peripheral angiopathy without gangrene: Secondary | ICD-10-CM | POA: Insufficient documentation

## 2018-02-09 DIAGNOSIS — I87312 Chronic venous hypertension (idiopathic) with ulcer of left lower extremity: Secondary | ICD-10-CM | POA: Diagnosis not present

## 2018-02-11 DIAGNOSIS — E119 Type 2 diabetes mellitus without complications: Secondary | ICD-10-CM | POA: Diagnosis not present

## 2018-02-12 DIAGNOSIS — E11622 Type 2 diabetes mellitus with other skin ulcer: Secondary | ICD-10-CM | POA: Diagnosis not present

## 2018-02-12 DIAGNOSIS — L03116 Cellulitis of left lower limb: Secondary | ICD-10-CM | POA: Diagnosis not present

## 2018-02-12 DIAGNOSIS — I87313 Chronic venous hypertension (idiopathic) with ulcer of bilateral lower extremity: Secondary | ICD-10-CM | POA: Diagnosis not present

## 2018-02-12 DIAGNOSIS — I482 Chronic atrial fibrillation: Secondary | ICD-10-CM | POA: Diagnosis not present

## 2018-02-12 DIAGNOSIS — L97222 Non-pressure chronic ulcer of left calf with fat layer exposed: Secondary | ICD-10-CM | POA: Diagnosis not present

## 2018-02-12 DIAGNOSIS — E1151 Type 2 diabetes mellitus with diabetic peripheral angiopathy without gangrene: Secondary | ICD-10-CM | POA: Diagnosis not present

## 2018-02-12 DIAGNOSIS — L97522 Non-pressure chronic ulcer of other part of left foot with fat layer exposed: Secondary | ICD-10-CM | POA: Diagnosis not present

## 2018-02-12 DIAGNOSIS — L97819 Non-pressure chronic ulcer of other part of right lower leg with unspecified severity: Secondary | ICD-10-CM | POA: Diagnosis not present

## 2018-02-12 DIAGNOSIS — I890 Lymphedema, not elsewhere classified: Secondary | ICD-10-CM | POA: Diagnosis not present

## 2018-02-12 DIAGNOSIS — I87333 Chronic venous hypertension (idiopathic) 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: Secondary | ICD-10-CM | POA: Diagnosis not present

## 2018-02-12 DIAGNOSIS — E1140 Type 2 diabetes mellitus with diabetic neuropathy, unspecified: Secondary | ICD-10-CM | POA: Diagnosis not present

## 2018-02-12 DIAGNOSIS — I10 Essential (primary) hypertension: Secondary | ICD-10-CM | POA: Diagnosis not present

## 2018-02-12 DIAGNOSIS — L97829 Non-pressure chronic ulcer of other part of left lower leg with unspecified severity: Secondary | ICD-10-CM | POA: Diagnosis not present

## 2018-02-12 DIAGNOSIS — L97822 Non-pressure chronic ulcer of other part of left lower leg with fat layer exposed: Secondary | ICD-10-CM | POA: Diagnosis not present

## 2018-02-12 DIAGNOSIS — L97212 Non-pressure chronic ulcer of right calf with fat layer exposed: Secondary | ICD-10-CM | POA: Diagnosis not present

## 2018-02-16 DIAGNOSIS — L97222 Non-pressure chronic ulcer of left calf with fat layer exposed: Secondary | ICD-10-CM | POA: Diagnosis not present

## 2018-02-16 DIAGNOSIS — E1151 Type 2 diabetes mellitus with diabetic peripheral angiopathy without gangrene: Secondary | ICD-10-CM | POA: Diagnosis not present

## 2018-02-16 DIAGNOSIS — E11622 Type 2 diabetes mellitus with other skin ulcer: Secondary | ICD-10-CM | POA: Diagnosis not present

## 2018-02-16 DIAGNOSIS — L03116 Cellulitis of left lower limb: Secondary | ICD-10-CM | POA: Diagnosis not present

## 2018-02-16 DIAGNOSIS — E1140 Type 2 diabetes mellitus with diabetic neuropathy, unspecified: Secondary | ICD-10-CM | POA: Diagnosis not present

## 2018-02-16 DIAGNOSIS — I87313 Chronic venous hypertension (idiopathic) with ulcer of bilateral lower extremity: Secondary | ICD-10-CM | POA: Diagnosis not present

## 2018-02-16 DIAGNOSIS — I890 Lymphedema, not elsewhere classified: Secondary | ICD-10-CM | POA: Diagnosis not present

## 2018-02-16 DIAGNOSIS — I10 Essential (primary) hypertension: Secondary | ICD-10-CM | POA: Diagnosis not present

## 2018-02-16 DIAGNOSIS — L97822 Non-pressure chronic ulcer of other part of left lower leg with fat layer exposed: Secondary | ICD-10-CM | POA: Diagnosis not present

## 2018-02-16 DIAGNOSIS — L97819 Non-pressure chronic ulcer of other part of right lower leg with unspecified severity: Secondary | ICD-10-CM | POA: Diagnosis not present

## 2018-02-16 DIAGNOSIS — L97522 Non-pressure chronic ulcer of other part of left foot with fat layer exposed: Secondary | ICD-10-CM | POA: Diagnosis not present

## 2018-02-16 DIAGNOSIS — L97212 Non-pressure chronic ulcer of right calf with fat layer exposed: Secondary | ICD-10-CM | POA: Diagnosis not present

## 2018-02-16 DIAGNOSIS — L97829 Non-pressure chronic ulcer of other part of left lower leg with unspecified severity: Secondary | ICD-10-CM | POA: Diagnosis not present

## 2018-02-16 DIAGNOSIS — S91105A Unspecified open wound of left lesser toe(s) without damage to nail, initial encounter: Secondary | ICD-10-CM | POA: Diagnosis not present

## 2018-02-16 DIAGNOSIS — I87333 Chronic venous hypertension (idiopathic) 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: Secondary | ICD-10-CM | POA: Diagnosis not present

## 2018-02-16 DIAGNOSIS — I482 Chronic atrial fibrillation: Secondary | ICD-10-CM | POA: Diagnosis not present

## 2018-02-19 ENCOUNTER — Other Ambulatory Visit: Payer: Self-pay | Admitting: Family Medicine

## 2018-02-23 ENCOUNTER — Other Ambulatory Visit (HOSPITAL_COMMUNITY)
Admission: RE | Admit: 2018-02-23 | Discharge: 2018-02-23 | Disposition: A | Discharge status: Home / Self Care | Payer: Medicare Other | Source: Other Acute Inpatient Hospital | Attending: Internal Medicine | Admitting: Internal Medicine

## 2018-02-23 ENCOUNTER — Ambulatory Visit: Payer: Medicare Other | Admitting: Registered"

## 2018-02-23 DIAGNOSIS — L97212 Non-pressure chronic ulcer of right calf with fat layer exposed: Secondary | ICD-10-CM | POA: Diagnosis not present

## 2018-02-23 DIAGNOSIS — L97522 Non-pressure chronic ulcer of other part of left foot with fat layer exposed: Secondary | ICD-10-CM | POA: Diagnosis not present

## 2018-02-23 DIAGNOSIS — E1151 Type 2 diabetes mellitus with diabetic peripheral angiopathy without gangrene: Secondary | ICD-10-CM | POA: Diagnosis not present

## 2018-02-23 DIAGNOSIS — I890 Lymphedema, not elsewhere classified: Secondary | ICD-10-CM | POA: Diagnosis not present

## 2018-02-23 DIAGNOSIS — L97819 Non-pressure chronic ulcer of other part of right lower leg with unspecified severity: Secondary | ICD-10-CM | POA: Diagnosis not present

## 2018-02-23 DIAGNOSIS — I10 Essential (primary) hypertension: Secondary | ICD-10-CM | POA: Diagnosis not present

## 2018-02-23 DIAGNOSIS — L97221 Non-pressure chronic ulcer of left calf limited to breakdown of skin: Secondary | ICD-10-CM | POA: Insufficient documentation

## 2018-02-23 DIAGNOSIS — E11622 Type 2 diabetes mellitus with other skin ulcer: Secondary | ICD-10-CM | POA: Diagnosis not present

## 2018-02-23 DIAGNOSIS — L97829 Non-pressure chronic ulcer of other part of left lower leg with unspecified severity: Secondary | ICD-10-CM | POA: Diagnosis not present

## 2018-02-23 DIAGNOSIS — L97822 Non-pressure chronic ulcer of other part of left lower leg with fat layer exposed: Secondary | ICD-10-CM | POA: Diagnosis not present

## 2018-02-23 DIAGNOSIS — L03116 Cellulitis of left lower limb: Secondary | ICD-10-CM | POA: Diagnosis not present

## 2018-02-23 DIAGNOSIS — I87333 Chronic venous hypertension (idiopathic) 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: Secondary | ICD-10-CM | POA: Diagnosis not present

## 2018-02-23 DIAGNOSIS — L97222 Non-pressure chronic ulcer of left calf with fat layer exposed: Secondary | ICD-10-CM | POA: Diagnosis not present

## 2018-02-23 DIAGNOSIS — I482 Chronic atrial fibrillation: Secondary | ICD-10-CM | POA: Diagnosis not present

## 2018-02-23 DIAGNOSIS — E1140 Type 2 diabetes mellitus with diabetic neuropathy, unspecified: Secondary | ICD-10-CM | POA: Diagnosis not present

## 2018-02-24 ENCOUNTER — Ambulatory Visit: Payer: Medicare Other | Admitting: Registered"

## 2018-02-24 ENCOUNTER — Ambulatory Visit (INDEPENDENT_AMBULATORY_CARE_PROVIDER_SITE_OTHER): Payer: Medicare Other | Admitting: Family Medicine

## 2018-02-24 ENCOUNTER — Encounter: Payer: Self-pay | Admitting: Family Medicine

## 2018-02-24 VITALS — BP 164/66 | HR 60 | Resp 15 | Ht 70.0 in | Wt 385.0 lb

## 2018-02-24 DIAGNOSIS — Z0000 Encounter for general adult medical examination without abnormal findings: Secondary | ICD-10-CM | POA: Diagnosis not present

## 2018-02-24 NOTE — Progress Notes (Signed)
WELCOME TO MEDICARE (IPPE) VISIT I explained that today's visit was for the purpose of health promotion and disease detection, as well as an introduction to Medicare and it's covered benefits.  I explained that no labs or other services would be performed today, but if any were determined to be necessary then appropriate orders/referrals would be arranged for these to be done at a future date.  Patient is a 67 y/o white male who is already an established patient with me.   Pt's medical and social history were reviewed. Specifically, we reviewed PMH/PSH/Meds/FH.  Also reviewed alcohol, tobacco, and illicit drug use.  Diet and physical activity reviewed.  NO physical activity due to obesity and chronic severe LE edema with associated recurrent stasis ulcers and cellulitis. Diet: not working on anything at this time, although he does see a nutritionist.  All of this info is also found in the appropriate sections of pt's EMR.  Pt was screened with appropriate screening instrument for depression.  Current or past experiences with mood disorders was discussed.   No signif depression problems.   PHQ 9=ZERO today.  Fall Risk  03/04/2018 02/24/2018 01/13/2018 01/08/2018 09/15/2017  Falls in the past year? No Yes No No No  Number falls in past yr: - 1 - - -  Injury with Fall? - Yes - - -  Risk for fall due to : - Impaired mobility - - -  Follow up - Education provided;Falls prevention discussed - - -     Pt's functional ability and level of safety were reviewed. Specifically, I screened for hearing impairment and fall risk.  I assessed home safety and we discussed pt's competency with activities of daily living.  Uses rolling walker with seat used to help balance. He is independent in all ADLs.  None of his meds were taken today. EXAM: Vitals:   02/24/18 1008  BP: (!) 164/66  Pulse: 60  Resp: 15  SpO2: 94%   Body mass index is 55.24 kg/m. Visual acuity screen: No exam data present Pt gets  routine eye care through optometrist/ophthalmologist and is up to date with follow up care with this provider.  NO abnormalities noted on most recent exam. No additional physical exam required or indicated today.  End of life planning: Advanced directives and power of attorney information specific to the patient were discussed.  He has nothing in place for this right now.  He was given a packet for this today.   Past Medical History:  Diagnosis Date  . ALLERGIC RHINITIS 08/11/2006  . ASTHMA 08/11/2006  . Chronic atrial fibrillation (HCC) 08/2014  . Chronic combined systolic and diastolic CHF (congestive heart failure) [facility](HCC)    Cardiology f/u 12/2017: pt volume overloaded (R heart dysf suspected), BNP very high, lasix increased.  Repeat echo 12/2017: normal LV EF, mild DD, +RV syst dysfxn, mod pulm HTN, biatrial enlgmt.  . Chronic constipation   . Chronic renal insufficiency, stage 2 (mild)    Borderline stage III (GFR 60s).  . DIABETES MELLITUS, TYPE II 08/11/2006  . DM W/RENAL MANIFESTATIONS, TYPE II 04/20/2007  . HYPERTENSION 08/11/2006  . OBESITY, MORBID 12/14/2007  . 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  At f/u 92018 ID started him on amoxil prophylaxis.  End 2018/Jan 2019 penicillin G injections prophyl helpful but pt declined to continue this as of 01/2018 ID f/u.  ID rec's amoxil-based abx for  future flares, add doxy only if purulence or no imp in 3d.  Marland Kitchen Restless leg syndrome    Rx'd clonazepam 09/2017 and pt refused to take it after reading the medication's potential side effects.  . Venous stasis ulcers of both lower extremities (HCC)    wound clinic care ongoing as of 01/2018   Past Surgical History:  Procedure Laterality Date  . TRANSTHORACIC ECHOCARDIOGRAM  11/2007; 09/2014; 11/2015;12/2017   LV fxn normal, EF normal, mild dilation of left atrium.  2015 grade II diast  dysfxn.  2017 EF 55-60%. 2019: LVEF 60-65%, mild RV syst dysf,biatrial enlgmt, mod pulm htn.  Marland Kitchen URETERAL STENT PLACEMENT    . virtual colonoscopy  01/2011   Normal    Education, counseling, and referral for other preventive services:  Written checklist was completed and given to pt for obtaining, as appropriate, the other preventive services that are covered as separate Medicare Part B benefits. Possible services that were reviewed with pt are:  -annual wellness visit (AWV) -Bone mass measurements: n/a at this time. -Cardiovascular screening blood tests: pt gets these regularly as part of DM monitoring. -Colorectal cancer screening: next colonoscopy due 2022. -Counseling to prevent tobacco use. -Diabetes screening tests: n/a (pt has DM and I follow/treat this). -Diabetes self-management training (DSMT): pt currently getting this. -Glaucoma screening: recent screen with eye MD--no problem. -HIV screening: pt declines. -Medical nutrition therapy -Prostate cancer screening: last was 12/2016--we'll do this at upcoming CPE. -Seasonal influenza, pneumococcal, and Hep B vaccines: pt currently UTD. -screening mammography: n/a -screening pap tests and pelvic exam: n/a -ultrasound screening for AAA: pt does not qualify for this.  Education, counseling, and referrals based on the information obtained/reviewed today:  Of the above listed services, no referrals were made today.   Patient did not have an additional complaint/problem that was discussed and evaluated today.  Patient was given opportunity to ask any additional questions regarding Medicare and covered benefits.  Patient was informed that Medicare does not provide coverage for routine physical exams.  I answered all questions to the best of my ability today.    An After Visit Summary was printed and given to the patient.  Follow up: Return for Keep CPE appt already scheduled for 03/09/18.  Signed:  Santiago Bumpers, MD            03/05/2018

## 2018-02-26 ENCOUNTER — Ambulatory Visit: Payer: Medicare Other | Admitting: Registered"

## 2018-02-26 DIAGNOSIS — I10 Essential (primary) hypertension: Secondary | ICD-10-CM | POA: Diagnosis not present

## 2018-02-26 DIAGNOSIS — I890 Lymphedema, not elsewhere classified: Secondary | ICD-10-CM | POA: Diagnosis not present

## 2018-02-26 DIAGNOSIS — E11622 Type 2 diabetes mellitus with other skin ulcer: Secondary | ICD-10-CM | POA: Diagnosis not present

## 2018-02-26 DIAGNOSIS — E1151 Type 2 diabetes mellitus with diabetic peripheral angiopathy without gangrene: Secondary | ICD-10-CM | POA: Diagnosis not present

## 2018-02-26 DIAGNOSIS — I482 Chronic atrial fibrillation: Secondary | ICD-10-CM | POA: Diagnosis not present

## 2018-02-26 DIAGNOSIS — I87333 Chronic venous hypertension (idiopathic) 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: Secondary | ICD-10-CM | POA: Diagnosis not present

## 2018-02-26 DIAGNOSIS — L97819 Non-pressure chronic ulcer of other part of right lower leg with unspecified severity: Secondary | ICD-10-CM | POA: Diagnosis not present

## 2018-02-26 DIAGNOSIS — L97522 Non-pressure chronic ulcer of other part of left foot with fat layer exposed: Secondary | ICD-10-CM | POA: Diagnosis not present

## 2018-02-26 DIAGNOSIS — E1140 Type 2 diabetes mellitus with diabetic neuropathy, unspecified: Secondary | ICD-10-CM | POA: Diagnosis not present

## 2018-02-26 DIAGNOSIS — L03116 Cellulitis of left lower limb: Secondary | ICD-10-CM | POA: Diagnosis not present

## 2018-02-26 DIAGNOSIS — L97822 Non-pressure chronic ulcer of other part of left lower leg with fat layer exposed: Secondary | ICD-10-CM | POA: Diagnosis not present

## 2018-02-26 DIAGNOSIS — L97829 Non-pressure chronic ulcer of other part of left lower leg with unspecified severity: Secondary | ICD-10-CM | POA: Diagnosis not present

## 2018-02-27 LAB — AEROBIC CULTURE W GRAM STAIN (SUPERFICIAL SPECIMEN)

## 2018-02-27 LAB — AEROBIC CULTURE  (SUPERFICIAL SPECIMEN)

## 2018-02-28 ENCOUNTER — Other Ambulatory Visit: Payer: Self-pay | Admitting: Family Medicine

## 2018-03-02 DIAGNOSIS — E1151 Type 2 diabetes mellitus with diabetic peripheral angiopathy without gangrene: Secondary | ICD-10-CM | POA: Diagnosis not present

## 2018-03-02 DIAGNOSIS — I10 Essential (primary) hypertension: Secondary | ICD-10-CM | POA: Diagnosis not present

## 2018-03-02 DIAGNOSIS — I87333 Chronic venous hypertension (idiopathic) 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: Secondary | ICD-10-CM | POA: Diagnosis not present

## 2018-03-02 DIAGNOSIS — L97229 Non-pressure chronic ulcer of left calf with unspecified severity: Secondary | ICD-10-CM | POA: Diagnosis not present

## 2018-03-02 DIAGNOSIS — I87332 Chronic venous hypertension (idiopathic) with ulcer and inflammation of left lower extremity: Secondary | ICD-10-CM | POA: Diagnosis not present

## 2018-03-02 DIAGNOSIS — I890 Lymphedema, not elsewhere classified: Secondary | ICD-10-CM | POA: Diagnosis not present

## 2018-03-02 DIAGNOSIS — L97522 Non-pressure chronic ulcer of other part of left foot with fat layer exposed: Secondary | ICD-10-CM | POA: Diagnosis not present

## 2018-03-02 DIAGNOSIS — L97822 Non-pressure chronic ulcer of other part of left lower leg with fat layer exposed: Secondary | ICD-10-CM | POA: Diagnosis not present

## 2018-03-02 DIAGNOSIS — E11622 Type 2 diabetes mellitus with other skin ulcer: Secondary | ICD-10-CM | POA: Diagnosis not present

## 2018-03-02 DIAGNOSIS — I482 Chronic atrial fibrillation: Secondary | ICD-10-CM | POA: Diagnosis not present

## 2018-03-02 DIAGNOSIS — S81801A Unspecified open wound, right lower leg, initial encounter: Secondary | ICD-10-CM | POA: Diagnosis not present

## 2018-03-02 DIAGNOSIS — L03116 Cellulitis of left lower limb: Secondary | ICD-10-CM | POA: Diagnosis not present

## 2018-03-02 DIAGNOSIS — L97829 Non-pressure chronic ulcer of other part of left lower leg with unspecified severity: Secondary | ICD-10-CM | POA: Diagnosis not present

## 2018-03-02 DIAGNOSIS — E1140 Type 2 diabetes mellitus with diabetic neuropathy, unspecified: Secondary | ICD-10-CM | POA: Diagnosis not present

## 2018-03-02 DIAGNOSIS — S91105A Unspecified open wound of left lesser toe(s) without damage to nail, initial encounter: Secondary | ICD-10-CM | POA: Diagnosis not present

## 2018-03-02 DIAGNOSIS — L97819 Non-pressure chronic ulcer of other part of right lower leg with unspecified severity: Secondary | ICD-10-CM | POA: Diagnosis not present

## 2018-03-03 ENCOUNTER — Ambulatory Visit (INDEPENDENT_AMBULATORY_CARE_PROVIDER_SITE_OTHER): Payer: Medicare Other | Admitting: Infectious Diseases

## 2018-03-03 ENCOUNTER — Encounter: Payer: Self-pay | Admitting: Infectious Diseases

## 2018-03-03 ENCOUNTER — Telehealth: Payer: Self-pay

## 2018-03-03 DIAGNOSIS — L03119 Cellulitis of unspecified part of limb: Secondary | ICD-10-CM | POA: Diagnosis not present

## 2018-03-03 NOTE — Assessment & Plan Note (Addendum)
In discission with Mr. Krauss I reviewed how well he did with previous injections and much improved frequency of flares requiring antibiotics. He has ongoing risk factors for recurrent cellulitis that are not likely to be modified easily (morbid obesity, diabetes, lymphedema/chronic swelling). We decided to resume Bicillin injections 1.2 million units IM Q4w. I explained to him that I believe the oral suppressive agents failed likely due to his elevated BMI/body surface area and did not get to therapeutic effect. He will finish out the current treatment of Amoxicillin x 7 days as prescribed by Dr. Leanord Hawking (recent superficial culture growing group b strep. Monday May 6th he has an appointment with Dr. Milinda Cave and is requesting to arrange injections at his office again. I will reach out to Dr. Milinda Cave and make this request on Jasmond's behalf. He will return here in 3 months or sooner if needed - I have asked him to keep a more accurate log/record as to how often he is prescribed antibiotics. Will request records from Dr. Jannetta Quint office as well. I asked him to coordinate appointment in the future to where I can take his dressing off to assess his wounds if present.  Great appreciation to Dr. Leanord Hawking and his wound team for ongoing care.

## 2018-03-03 NOTE — Progress Notes (Signed)
Name: [name]  DOB: 09/12/1951  MRN: [id number]  PCP: [name], MD  Referring Provider: Dr. Leanord Hawking   Chief Complaint  Patient presents with  . Follow-up    recurrent cellulitis     Patient Active Problem List   Diagnosis Date Noted  . Non-healing ulcer of lower extremity, unspecified laterality, with unspecified severity (HCC) 12/15/2017  . Chronic diastolic heart failure (HCC) 12/24/2016  . Peripheral edema 02/22/2016  . Wheezing 10/24/2015  . 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  . Chronic headaches 05/10/2015  . Venous stasis of both lower extremities 02/14/2015  . Equivocal stress test 10/24/2014  . Atrial fibrillation (HCC) 08/15/2014  . Healthcare maintenance 08/15/2014  . Type 2 diabetes mellitus with diabetic neuropathy (HCC) 08/15/2014  . 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 Recurrent cellulitis of lower extremity 08/02/2013  . Lower extremity edema 11/04/2008  . Morbid obesity due to excess calories (HCC) 12/14/2007  . Venous (peripheral) insufficiency 07/22/2007  . Diabetic renal disease (HCC) 04/20/2007  . DM2 (diabetes mellitus, type 2) (HCC) 08/11/2006  . Hypertension, essential 08/11/2006  . ALLERGIC RHINITIS 08/11/2006  . ASTHMA 08/11/2006   Subjective:  Brief Narrative:   [name] is a 67 y.o. male referred for evaluation of his recurrent cellulitis of his lower extremities. He has been followed closely by the [facility] since March 2017. He has a long standing history of morbid obesity, diabetes, B/L venous stasis with ulcerations, chronic lower extremity swelling / lymphadenopathy and weeping. Significant pmhx includes T2DM with insulin requirement, morbid obesity, diastolic CHF and significant swelling of his lower extremities / lymphedema. Last HgbA1C 6.9% in Feb 2019. ABI in April 2017 did not require intervention and LE venous dopplers 10/2016 without deep or superficial thrombus. He has been  treated several times with combination of Doxycycline, Amoxicillin and Augmentin. Previously we tried prophylactic Amoxicillin and Clindamycin without effect. He did have some improvement on Bicillin 1.2 million units IM Q4w however after 2 doses he did not return and decided to trial off therapy.   HPI: [name] is here today at the urging of Dr. Leanord Hawking as he has had several flare ups of his cellulitis in the time he has been off bicillin injections. Right leg has a wrap on it that was placed yesterday and he politely declines my request to remove this for assessment. He reports he has ongoing trouble with edema management and is experiencing increased drainage to the left leg. No active ulcers reported on right leg but wrapped for fluid management/compression therapy.  He is currently on Amoxicillin x 7 days. Only new medication is a supplement called Forskolin that he is using for weight loss.   Review of Systems  Constitutional: Negative for chills, fever, malaise/fatigue and weight loss.  HENT: Negative for sore throat.   Respiratory: Negative for cough and sputum production.   Cardiovascular: Positive for leg swelling. Negative for chest pain, palpitations and claudication.  Gastrointestinal: Negative for abdominal pain, diarrhea and vomiting.  Genitourinary: Negative for dysuria and flank pain.  Musculoskeletal: Positive for joint pain.  Skin: Negative for rash.  Neurological: Negative for dizziness and headaches.    Past Medical History:  Diagnosis Date  . ALLERGIC RHINITIS 08/11/2006  . ASTHMA 08/11/2006  . Chronic atrial fibrillation (HCC) 08/2014  . Chronic combined systolic and diastolic CHF (congestive heart failure) [facility])    Cardiology f/u 12/2017: pt volume overloaded (R heart dysf suspected), BNP very high, lasix increased.  Repeat  echo 12/2017: normal LV EF, mild DD, +RV syst dysfxn, mod pulm HTN, biatrial enlgmt.  . Chronic constipation   . Chronic renal insufficiency, stage  2 (mild)    Borderline stage III (GFR 60s).  . DIABETES MELLITUS, TYPE II 08/11/2006  . DM W/RENAL MANIFESTATIONS, TYPE II 04/20/2007  . HYPERTENSION 08/11/2006  . OBESITY, MORBID 12/14/2007  . 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  At f/u 92018 ID started him on amoxil prophylaxis.  End 2018/Jan 2019 penicillin G injections prophyl helpful but pt declined to continue this as of 01/2018 ID f/u.  ID rec's amoxil-based abx for future flares, add doxy only if purulence or no imp in 3d.  Marland Kitchen Restless leg syndrome    Rx'd clonazepam 09/2017 and pt refused to take it after reading the medication's potential side effects.  . Venous stasis ulcers of both lower extremities (HCC)    wound clinic care ongoing as of 01/2018   Outpatient Medications Prior to Visit  Medication Sig Dispense Refill  . amoxicillin (AMOXIL) 500 MG capsule   0  . b complex vitamins capsule Take 1 capsule by mouth every morning.    . butalbital-acetaminophen-caffeine (FIORICET WITH CODEINE) 50-325-40-30 MG capsule 1-2 caps po q6h prn headache 90 capsule 1  . cetirizine (ZYRTEC) 10 MG tablet Take 10 mg by mouth daily.    . Cinnamon 500 MG TABS Take by mouth. Reported on 02/19/2016    . Coenzyme Q10 (CO Q 10) 10 MG CAPS Take by mouth. Reported on 02/19/2016    . diltiazem (TIAZAC) 360 MG 24 hr capsule TAKE 1 CAPSULE BY MOUTH  DAILY AS DIRECTED 90 capsule 1  . Forskolin POWD by Does not apply route.    . furosemide (LASIX) 40 MG tablet Take 2 tablets (80 mg total) by mouth 2 (two) times daily. 360 tablet 0  . glucose blood (ONE TOUCH ULTRA TEST) test strip CHECK BLOOD SUGAR TWO TIMES DAILY 100 each 11  . HUMALOG MIX 75/25 KWIKPEN (75-25) 100 UNIT/ML Kwikpen INJECT SUBCUTANEOUSLY 25  UNITS EVERY MORNING AND 20  UNITS EVERY EVENING AT  SUPPER 45 mL 1  . Insulin Pen Needle (BD ULTRA-FINE PEN NEEDLES) 29G X 12.7MM MISC 1 each  by Other route 2 (two) times daily. 200 each 2  . Insulin Pen Needle (BD ULTRA-FINE PEN NEEDLES) 29G X 12.7MM MISC USE TWICE DAILY 200 each 3  . Insulin Pen Needle (RELION PEN NEEDLE 31G/8MM) 31G X 8 MM MISC 1 each by Does not apply route 2 (two) times daily.    Marland Kitchen lisinopril (PRINIVIL,ZESTRIL) 40 MG tablet TAKE 1 TABLET BY MOUTH  EVERY DAY 90 tablet 1  . metFORMIN (GLUCOPHAGE) 1000 MG tablet TAKE 1 TABLET BY MOUTH  TWICE A DAY WITH MEALS 180 tablet 1  . metoprolol tartrate (LOPRESSOR) 50 MG tablet TAKE 1 AND 1/2 TABLETS BY  MOUTH TWO TIMES DAILY 270 tablet 1  . Multiple Vitamin (MULTIVITAMIN) tablet Take 1 tablet by mouth daily.    . Nutritional Supplements (GRAPESEED EXTRACT PO) Take 1 capsule by mouth daily.    . saw palmetto 500 MG capsule Take 500 mg by mouth daily.    Marland Kitchen terazosin (HYTRIN) 10 MG capsule TAKE 1 CAPSULE BY MOUTH  ONCE A DAY AT BEDTIME 90 capsule 1  . terazosin (HYTRIN) 10 MG capsule TAKE 1 CAPSULE BY MOUTH  ONCE A DAY AT BEDTIME 90  capsule 1  . vitamin E 400 UNIT capsule Take 400 Units by mouth every morning. Selenium     . XARELTO 20 MG TABS tablet TAKE 1 TABLET BY MOUTH ONCE DAILY WITH SUPPER 90 tablet 1  . clotrimazole (LOTRIMIN) 1 % cream Apply 1 application topically 2 (two) times daily. To toe nails and bottom of your left foot. (Patient not taking: Reported on 02/24/2018) 30 g 3  . doxycycline (PERIOSTAT) 20 MG tablet Take 20 mg by mouth 2 (two) times daily.     No facility-administered medications prior to visit.    No Known Allergies  Physical Exam and Objective Findings: Vitals:   03/03/18 1019  BP: (!) 148/68  Pulse: (!) 52  Temp: (!) 97.4 F (36.3 C)  TempSrc: Oral  Weight: (!) 380 lb (172.4 kg)   Body mass index is 54.52 kg/m.  Physical Exam  Constitutional: He is oriented to person, place, and time.  Non-toxic appearance.  Seated in chair with rollator walker. Comfortable. Legs wrapped bilaterally.   Eyes: No scleral icterus.  Neck: Neck  supple. No JVD present.  Cardiovascular: Normal rate, regular rhythm and intact distal pulses.  No murmur heard. Pulmonary/Chest: Effort normal and breath sounds normal. He has no wheezes. He has no rales.  Abdominal: Soft. He exhibits no distension. There is no tenderness.  Musculoskeletal: He exhibits edema. He exhibits no tenderness.  Rollator walker for walking aid.  Bilateral legs wrapped with treated dressing. Politely declines to remove them as they were just applied.   Neurological: He is alert and oriented to person, place, and time.  Skin: Skin is warm and dry.  Psychiatric: Affect normal.  Vitals reviewed.  ASSESSMENT & PLAN:  Problem List Items Addressed This Visit      Other   Recurrent cellulitis of lower extremity    In discission with [name] I reviewed how well he did with previous injections and much improved frequency of flares requiring antibiotics. He has ongoing risk factors for recurrent cellulitis that are not likely to be modified easily (morbid obesity, diabetes, lymphedema/chronic swelling). We decided to resume Bicillin injections 1.2 million units IM Q4w. I explained to him that I believe the oral suppressive agents failed likely due to his elevated BMI/body surface area and did not get to therapeutic effect. He will finish out the current treatment of Amoxicillin x 7 days as prescribed by Dr. Leanord Hawking (recent superficial culture growing group b strep. Monday May 6th he has an appointment with Dr. Milinda Cave and is requesting to arrange injections at his office again. I will reach out to Dr. Milinda Cave and make this request on Jordyn's behalf. He will return here in 3 months or sooner if needed - I have asked him to keep a more accurate log/record as to how often he is prescribed antibiotics. Will request records from Dr. Jannetta Quint office as well. I asked him to coordinate appointment in the future to where I can take his dressing off to assess his wounds if present.  Great  appreciation to Dr. Leanord Hawking and his wound team for ongoing care.         Rexene Alberts, MSN, [facility]  03/03/2018 1:27 PM

## 2018-03-03 NOTE — Telephone Encounter (Signed)
Called wound care to get recent office notes on pt per Rexene Alberts, NP. Spoke with Rn who was able to fax over notes to [phone]. Lorenso Courier, [place]

## 2018-03-03 NOTE — Patient Instructions (Addendum)
We will get you back on the Penicillin shots every 4 weeks.   I would like to see you back in 3 months to check in. Sooner if needed.   Please keep a record of how often you need antibiotics.

## 2018-03-04 ENCOUNTER — Encounter: Payer: Medicare Other | Attending: Family Medicine | Admitting: Registered"

## 2018-03-04 DIAGNOSIS — Z713 Dietary counseling and surveillance: Secondary | ICD-10-CM | POA: Diagnosis not present

## 2018-03-04 DIAGNOSIS — E118 Type 2 diabetes mellitus with unspecified complications: Secondary | ICD-10-CM | POA: Diagnosis not present

## 2018-03-04 DIAGNOSIS — E1165 Type 2 diabetes mellitus with hyperglycemia: Secondary | ICD-10-CM | POA: Insufficient documentation

## 2018-03-04 NOTE — Patient Instructions (Addendum)
Keep checking your fasting number in the morning before you eat 2-3 times per week check 2 hours after a meal. Continue eating balanced meals and snacks Continue cutting back on soda Pay attention to your body to know when and how much to eat.

## 2018-03-04 NOTE — Progress Notes (Signed)
Diabetes Self-Management Education  Visit Type: Follow-up  Appt. Start Time: 1010 Appt. End Time: 1110  03/04/2018  [name], identified by name and date of birth, is a 67 y.o. male with a diagnosis of Diabetes: Type 2.   ASSESSMENT Patient states since last visit he is eating a little better, has lean cuisine for dinner, patient states he has lost 7 lbs. Patient states he has started using a rollator instead of a cane which helps him move more and can sit down when he gets tired. Patient states he is still drinking regular soda but has cut back.  Patient states he has been to the optometrist and his eyes are healthy and he states that he is not having anymore visual symptoms.  Patient states he started another supplement, Forskolin, which his doctor said was fine and he added to medication list. Patient states he started it to melt belly fat, he saw it on Dr. Neil [name] where they demonstrated it by pouring on a container of fat and watched the fat dissolve. Pt states this is his 3rd day and takes it in the evening so he can be close to a toilet (diarrhea side effect).  Patient needed review of balanced meals (carb and protein) and states his doctor wanted him to get a diet from me. Last visit patient was given meal ideas for 7 days. Patient also states concerns about sodium. RD did not discuss at this visit.  RD requested patient bring BG log or meter to next visit and keep a food log so we can discuss more specifics.  Diabetes Self-Management Education - 03/04/18 1300      Visit Information   Visit Type  Follow-up      Initial Visit   Diabetes Type  Type 2    Are you taking your medications as prescribed?  Yes      Complications   How often do you check your blood sugar?  1-2 times/day    Fasting Blood glucose range (mg/dL)  [phone] this morning      Dietary Intake   Breakfast  sausage mcmuffin with egg, coffee with cream    Snack (morning)  fruit cup    Lunch  chick fil-a  sand, sm fry, sm reg soda    Snack (afternoon)  none    Dinner  lean cuisine OR grilled chilcken, green beans, slaw    Snack (evening)  none OR milk & cookies    Beverage(s)  water, 12 oz soda w meal      Exercise   Exercise Type  ADL's    How many days per week to you exercise?  0    How many minutes per day do you exercise?  0    Total minutes per week of exercise  0      Patient Education   Nutrition management   Role of diet in the treatment of diabetes and the relationship between the three main macronutrients and blood glucose level    Monitoring  Purpose and frequency of SMBG.      Individualized Goals (developed by patient)   Nutrition  Other (comment) include protein when eating carbs    Monitoring   test my blood glucose as discussed      Outcomes   Expected Outcomes  Demonstrated interest in learning. Expect positive outcomes    Future DMSE  4-6 wks    Program Status  Not Completed      Subsequent Visit  Since your last visit have you experienced any weight changes?  Loss    Weight Loss (lbs)  7    Since your last visit, are you checking your blood glucose at least once a day?  Yes      Individualized Plan for Diabetes Self-Management Training:   Learning Objective:  Patient will have a greater understanding of diabetes self-management. Patient education plan is to attend individual and/or group sessions per assessed needs and concerns.  Patient Instructions  Keep checking your fasting number in the morning before you eat 2-3 times per week check 2 hours after a meal. Continue eating balanced meals and snacks Continue cutting back on soda Pay attention to your body to know when and how much to eat.  Expected Outcomes:  Demonstrated interest in learning. Expect positive outcomes  Education material provided: none  If problems or questions, patient to contact team via:  Phone and MyChart  Future DSME appointment: 4-6 wks

## 2018-03-05 ENCOUNTER — Encounter (HOSPITAL_BASED_OUTPATIENT_CLINIC_OR_DEPARTMENT_OTHER): Payer: Self-pay

## 2018-03-05 ENCOUNTER — Encounter (HOSPITAL_BASED_OUTPATIENT_CLINIC_OR_DEPARTMENT_OTHER): Payer: Medicare Other | Attending: Internal Medicine

## 2018-03-05 ENCOUNTER — Encounter: Payer: Self-pay | Admitting: Family Medicine

## 2018-03-05 DIAGNOSIS — I890 Lymphedema, not elsewhere classified: Secondary | ICD-10-CM | POA: Insufficient documentation

## 2018-03-05 DIAGNOSIS — L97822 Non-pressure chronic ulcer of other part of left lower leg with fat layer exposed: Secondary | ICD-10-CM | POA: Insufficient documentation

## 2018-03-05 DIAGNOSIS — L03116 Cellulitis of left lower limb: Secondary | ICD-10-CM | POA: Insufficient documentation

## 2018-03-05 DIAGNOSIS — L97522 Non-pressure chronic ulcer of other part of left foot with fat layer exposed: Secondary | ICD-10-CM | POA: Insufficient documentation

## 2018-03-05 DIAGNOSIS — E1151 Type 2 diabetes mellitus with diabetic peripheral angiopathy without gangrene: Secondary | ICD-10-CM | POA: Diagnosis not present

## 2018-03-05 DIAGNOSIS — E1140 Type 2 diabetes mellitus with diabetic neuropathy, unspecified: Secondary | ICD-10-CM | POA: Diagnosis not present

## 2018-03-05 DIAGNOSIS — E11622 Type 2 diabetes mellitus with other skin ulcer: Secondary | ICD-10-CM | POA: Diagnosis not present

## 2018-03-05 DIAGNOSIS — I87333 Chronic venous hypertension (idiopathic) 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: Secondary | ICD-10-CM | POA: Diagnosis not present

## 2018-03-05 DIAGNOSIS — L97812 Non-pressure chronic ulcer of other part of right lower leg with fat layer exposed: Secondary | ICD-10-CM | POA: Insufficient documentation

## 2018-03-05 DIAGNOSIS — L97819 Non-pressure chronic ulcer of other part of right lower leg with unspecified severity: Secondary | ICD-10-CM | POA: Diagnosis not present

## 2018-03-05 DIAGNOSIS — I10 Essential (primary) hypertension: Secondary | ICD-10-CM | POA: Insufficient documentation

## 2018-03-06 ENCOUNTER — Encounter: Payer: Self-pay | Admitting: Family Medicine

## 2018-03-09 ENCOUNTER — Ambulatory Visit (INDEPENDENT_AMBULATORY_CARE_PROVIDER_SITE_OTHER): Payer: Medicare Other | Admitting: Family Medicine

## 2018-03-09 ENCOUNTER — Encounter: Payer: Self-pay | Admitting: Family Medicine

## 2018-03-09 ENCOUNTER — Encounter (HOSPITAL_BASED_OUTPATIENT_CLINIC_OR_DEPARTMENT_OTHER): Payer: Medicare Other

## 2018-03-09 VITALS — BP 138/70 | HR 69 | Temp 98.1°F | Resp 16 | Ht 68.0 in | Wt 380.1 lb

## 2018-03-09 DIAGNOSIS — L97222 Non-pressure chronic ulcer of left calf with fat layer exposed: Secondary | ICD-10-CM | POA: Diagnosis not present

## 2018-03-09 DIAGNOSIS — L97819 Non-pressure chronic ulcer of other part of right lower leg with unspecified severity: Secondary | ICD-10-CM | POA: Diagnosis not present

## 2018-03-09 DIAGNOSIS — E11622 Type 2 diabetes mellitus with other skin ulcer: Secondary | ICD-10-CM | POA: Diagnosis not present

## 2018-03-09 DIAGNOSIS — Z794 Long term (current) use of insulin: Secondary | ICD-10-CM | POA: Diagnosis not present

## 2018-03-09 DIAGNOSIS — I87312 Chronic venous hypertension (idiopathic) with ulcer of left lower extremity: Secondary | ICD-10-CM | POA: Diagnosis not present

## 2018-03-09 DIAGNOSIS — L03116 Cellulitis of left lower limb: Secondary | ICD-10-CM | POA: Diagnosis not present

## 2018-03-09 DIAGNOSIS — I87333 Chronic venous hypertension (idiopathic) 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: Secondary | ICD-10-CM | POA: Diagnosis not present

## 2018-03-09 DIAGNOSIS — Z0001 Encounter for general adult medical examination with abnormal findings: Secondary | ICD-10-CM

## 2018-03-09 DIAGNOSIS — Z125 Encounter for screening for malignant neoplasm of prostate: Secondary | ICD-10-CM | POA: Diagnosis not present

## 2018-03-09 DIAGNOSIS — L97522 Non-pressure chronic ulcer of other part of left foot with fat layer exposed: Secondary | ICD-10-CM | POA: Diagnosis not present

## 2018-03-09 DIAGNOSIS — L0390 Cellulitis, unspecified: Secondary | ICD-10-CM | POA: Diagnosis not present

## 2018-03-09 DIAGNOSIS — I10 Essential (primary) hypertension: Secondary | ICD-10-CM | POA: Diagnosis not present

## 2018-03-09 DIAGNOSIS — L97812 Non-pressure chronic ulcer of other part of right lower leg with fat layer exposed: Secondary | ICD-10-CM | POA: Diagnosis not present

## 2018-03-09 DIAGNOSIS — E1140 Type 2 diabetes mellitus with diabetic neuropathy, unspecified: Secondary | ICD-10-CM | POA: Diagnosis not present

## 2018-03-09 DIAGNOSIS — E1151 Type 2 diabetes mellitus with diabetic peripheral angiopathy without gangrene: Secondary | ICD-10-CM | POA: Diagnosis not present

## 2018-03-09 DIAGNOSIS — E118 Type 2 diabetes mellitus with unspecified complications: Secondary | ICD-10-CM | POA: Diagnosis not present

## 2018-03-09 DIAGNOSIS — Z0000 Encounter for general adult medical examination without abnormal findings: Secondary | ICD-10-CM

## 2018-03-09 DIAGNOSIS — L97822 Non-pressure chronic ulcer of other part of left lower leg with fat layer exposed: Secondary | ICD-10-CM | POA: Diagnosis not present

## 2018-03-09 DIAGNOSIS — S81801A Unspecified open wound, right lower leg, initial encounter: Secondary | ICD-10-CM | POA: Diagnosis not present

## 2018-03-09 DIAGNOSIS — L03115 Cellulitis of right lower limb: Secondary | ICD-10-CM | POA: Diagnosis not present

## 2018-03-09 DIAGNOSIS — I890 Lymphedema, not elsewhere classified: Secondary | ICD-10-CM | POA: Diagnosis not present

## 2018-03-09 LAB — COMPREHENSIVE METABOLIC PANEL
ALT: 10 U/L (ref 0–53)
AST: 14 U/L (ref 0–37)
Albumin: 3.5 g/dL (ref 3.5–5.2)
Alkaline Phosphatase: 65 U/L (ref 39–117)
BUN: 16 mg/dL (ref 6–23)
CHLORIDE: 101 meq/L (ref 96–112)
CO2: 29 meq/L (ref 19–32)
Calcium: 8.7 mg/dL (ref 8.4–10.5)
Creatinine, Ser: 1.27 mg/dL (ref 0.40–1.50)
GFR: 60.14 mL/min (ref >60.00)
GLUCOSE: 125 mg/dL — AB (ref 70–99)
POTASSIUM: 4.8 meq/L (ref 3.5–5.1)
SODIUM: 140 meq/L (ref 135–145)
Total Bilirubin: 1.3 mg/dL — ABNORMAL HIGH (ref 0.2–1.2)
Total Protein: 6.5 g/dL (ref 6.0–8.3)

## 2018-03-09 LAB — CBC WITH DIFFERENTIAL/PLATELET
Basophils Absolute: 0 10*3/uL (ref 0.0–0.1)
Basophils Relative: 0.5 % (ref 0.0–3.0)
EOS ABS: 0.3 10*3/uL (ref 0.0–0.7)
Eosinophils Relative: 3.8 % (ref 0.0–5.0)
HCT: 35.5 % — ABNORMAL LOW (ref 39.0–52.0)
Hemoglobin: 11.7 g/dL — ABNORMAL LOW (ref 13.0–17.0)
Lymphocytes Relative: 13.9 % (ref 12.0–46.0)
Lymphs Abs: 0.9 10*3/uL (ref 0.7–4.0)
MCHC: 33.1 g/dL (ref 30.0–36.0)
MCV: 81.1 fl (ref 78.0–100.0)
MONO ABS: 0.6 10*3/uL (ref 0.1–1.0)
Monocytes Relative: 8.4 % (ref 3.0–12.0)
NEUTROS PCT: 73.4 % (ref 43.0–77.0)
Neutro Abs: 4.9 10*3/uL (ref 1.4–7.7)
Platelets: 223 10*3/uL (ref 150.0–400.0)
RBC: 4.37 Mil/uL (ref 4.22–5.81)
RDW: 18.4 % — AB (ref 11.5–15.5)
WBC: 6.7 10*3/uL (ref 4.0–10.5)

## 2018-03-09 LAB — LIPID PANEL
CHOL/HDL RATIO: 2
Cholesterol: 84 mg/dL (ref 0–200)
HDL: 34 mg/dL — ABNORMAL LOW (ref >39.00)
LDL CALC: 42 mg/dL (ref 0–99)
NONHDL: 50.15
Triglycerides: 41 mg/dL (ref 0.0–149.0)
VLDL: 8.2 mg/dL (ref 0.0–40.0)

## 2018-03-09 LAB — HEMOGLOBIN A1C: Hgb A1c MFr Bld: 6.8 % — ABNORMAL HIGH (ref 4.6–6.5)

## 2018-03-09 LAB — TSH: TSH: 5.19 u[IU]/mL — AB (ref 0.35–4.50)

## 2018-03-09 LAB — PSA, MEDICARE: PSA: 0.26 ng/mL (ref 0.10–4.00)

## 2018-03-09 MED ORDER — PENICILLIN G BENZATHINE 1200000 UNIT/2ML IM SUSP
600000.0000 [IU] | Freq: Once | INTRAMUSCULAR | Status: AC
  Administered 2018-03-09: 600000 [IU] via INTRAMUSCULAR

## 2018-03-09 MED ORDER — PENICILLIN G BENZATHINE 1200000 UNIT/2ML IM SUSP: 600000.0000 [IU] | Freq: Once | INTRAMUSCULAR | Status: DC

## 2018-03-09 MED ORDER — ZOSTER VAC RECOMB ADJUVANTED 50 MCG/0.5ML IM SUSR: 0.5000 mL | Freq: Once | INTRAMUSCULAR | 1 refills | Status: AC

## 2018-03-09 NOTE — Progress Notes (Signed)
Office Note 03/09/2018  CC:  Chief Complaint  Patient presents with  . Annual Exam    Pt is fasting.    HPI:  [name] is a 67 y.o. White male who is here for annual health maintenance exam.  No acute complaints. His biggest problem lately has been ongoing LE edema due to venous insuff edema + R HF; additional recurrent/chronic cellulitis associated with this condition.  Has una boot on each LL--gets these changed 1-2 times weekly at wound clinic---says he has appt to see Dr. Leanord Hawking at [facility]ound clinic today.  His ID MD has recommended he get back on monthly penicillin G injections and he has agreed to do this.  Past Medical History:  Diagnosis Date  . ALLERGIC RHINITIS 08/11/2006  . ASTHMA 08/11/2006  . Chronic atrial fibrillation (HCC) 08/2014  . Chronic combined systolic and diastolic CHF (congestive heart failure) [facility])    Cardiology f/u 12/2017: pt volume overloaded (R heart dysf suspected), BNP very high, lasix increased.  Repeat echo 12/2017: normal LV EF, mild DD, +RV syst dysfxn, mod pulm HTN, biatrial enlgmt.  . Chronic constipation   . Chronic renal insufficiency, stage 2 (mild)    Borderline stage III (GFR 60s).  . DIABETES MELLITUS, TYPE II 08/11/2006  . DM W/RENAL MANIFESTATIONS, TYPE II 04/20/2007  . HYPERTENSION 08/11/2006  . OBESITY, MORBID 12/14/2007  . 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  [id number] ID started him on amoxil prophylaxis---ineffective.  End 2018/Jan 2019 penicillin G injections prophyl helpful but pt declined to continue this as of 01/2018 ID f/u.  ID talked him into resuming monthly penicillin G as of 02/2018 f/u.  Marland Kitchen Restless leg syndrome    Rx'd clonazepam 09/2017 and pt refused to take it after reading the medication's potential side effects.  . Venous stasis ulcers of both lower extremities (HCC)    wound clinic care ongoing as of 01/2018    Past Surgical History:  Procedure  Laterality Date  . TRANSTHORACIC ECHOCARDIOGRAM  11/2007; 09/2014; 11/2015;12/2017   LV fxn normal, EF normal, mild dilation of left atrium.  2015 grade II diast dysfxn.  2017 EF 55-60%. 2019: LVEF 60-65%, mild RV syst dysf,biatrial enlgmt, mod pulm htn.  Marland Kitchen URETERAL STENT PLACEMENT    . virtual colonoscopy  01/2011   Normal    Family History  Problem Relation Age of Onset  . Diabetes Mother   . Diabetes Sister   . Hydrocephalus Sister        NPH    Social History   Socioeconomic History  . Marital status: Single    Spouse name: Not on file  . Number of children: Not on file  . Years of education: Not on file  . Highest education level: Not on file  Occupational History  . Not on file  Social Needs  . Financial resource strain: Not on file  . Food insecurity:    Worry: Not on file    Inability: Not on file  . Transportation needs:    Medical: Not on file    Non-medical: Not on file  Tobacco Use  . Smoking status: Never Smoker  . Smokeless tobacco: Never Used  Substance and Sexual Activity  . Alcohol use: No  . Drug use: No  . Sexual activity: Not on file  Lifestyle  . Physical activity:    Days per week: Not on file    Minutes per session: Not on file  .  Stress: Not on file  Relationships  . Social connections:    Talks on phone: Not on file    Gets together: Not on file    Attends religious service: Not on file    Active member of club or organization: Not on file    Attends meetings of clubs or organizations: Not on file    Relationship status: Not on file  . Intimate partner violence:    Fear of current or ex partner: Not on file    Emotionally abused: Not on file    Physically abused: Not on file    Forced sexual activity: Not on file  Other Topics Concern  . Not on file  Social History Narrative   Single, no children.   Lives in [place] since 1985 [place] from [place]   Occupation: Science writer for [employer].   No Tobacco, no alc, drugs.    Exercise: no    Diet: no       Outpatient Medications Prior to Visit  Medication Sig Dispense Refill  . amoxicillin (AMOXIL) 500 MG capsule   0  . b complex vitamins capsule Take 1 capsule by mouth every morning.    . butalbital-acetaminophen-caffeine (FIORICET WITH CODEINE) 50-325-40-30 MG capsule 1-2 caps po q6h prn headache 90 capsule 1  . cetirizine (ZYRTEC) 10 MG tablet Take 10 mg by mouth daily.    . Cinnamon 500 MG TABS Take by mouth. Reported on 02/19/2016    . Coenzyme Q10 (CO Q 10) 10 MG CAPS Take by mouth. Reported on 02/19/2016    . diltiazem (TIAZAC) 360 MG 24 hr capsule TAKE 1 CAPSULE BY MOUTH  DAILY AS DIRECTED 90 capsule 1  . Forskolin POWD by Does not apply route.    . furosemide (LASIX) 40 MG tablet Take 2 tablets (80 mg total) by mouth 2 (two) times daily. 360 tablet 0  . glucose blood (ONE TOUCH ULTRA TEST) test strip CHECK BLOOD SUGAR TWO TIMES DAILY 100 each 11  . HUMALOG MIX 75/25 KWIKPEN (75-25) 100 UNIT/ML Kwikpen INJECT SUBCUTANEOUSLY 25  UNITS EVERY MORNING AND 20  UNITS EVERY EVENING AT  SUPPER 45 mL 1  . Insulin Pen Needle (BD ULTRA-FINE PEN NEEDLES) 29G X 12.7MM MISC 1 each by Other route 2 (two) times daily. 200 each 2  . Insulin Pen Needle (BD ULTRA-FINE PEN NEEDLES) 29G X 12.7MM MISC USE TWICE DAILY 200 each 3  . Insulin Pen Needle (RELION PEN NEEDLE 31G/8MM) 31G X 8 MM MISC 1 each by Does not apply route 2 (two) times daily.    Marland Kitchen lisinopril (PRINIVIL,ZESTRIL) 40 MG tablet TAKE 1 TABLET BY MOUTH  EVERY DAY 90 tablet 1  . metFORMIN (GLUCOPHAGE) 1000 MG tablet TAKE 1 TABLET BY MOUTH  TWICE A DAY WITH MEALS 180 tablet 1  . metoprolol tartrate (LOPRESSOR) 50 MG tablet TAKE 1 AND 1/2 TABLETS BY  MOUTH TWO TIMES DAILY 270 tablet 1  . Multiple Vitamin (MULTIVITAMIN) tablet Take 1 tablet by mouth daily.    . Nutritional Supplements (GRAPESEED EXTRACT PO) Take 1 capsule by mouth daily.    . saw palmetto 500 MG capsule Take 500 mg by mouth daily.    Marland Kitchen terazosin  (HYTRIN) 10 MG capsule TAKE 1 CAPSULE BY MOUTH  ONCE A DAY AT BEDTIME 90 capsule 1  . vitamin E 400 UNIT capsule Take 400 Units by mouth every morning. Selenium     . XARELTO 20 MG TABS tablet TAKE 1 TABLET BY MOUTH ONCE DAILY  WITH SUPPER 90 tablet 1  . clotrimazole (LOTRIMIN) 1 % cream Apply 1 application topically 2 (two) times daily. To toe nails and bottom of your left foot. (Patient not taking: Reported on 02/24/2018) 30 g 3  . doxycycline (PERIOSTAT) 20 MG tablet Take 20 mg by mouth 2 (two) times daily.    Marland Kitchen terazosin (HYTRIN) 10 MG capsule TAKE 1 CAPSULE BY MOUTH  ONCE A DAY AT BEDTIME (Patient not taking: Reported on 03/09/2018) 90 capsule 1   No facility-administered medications prior to visit.     No Known Allergies  ROS Review of Systems  Constitutional: Negative for appetite change, chills, fatigue and fever.  HENT: Negative for congestion, dental problem, ear pain and sore throat.   Eyes: Negative for discharge, redness and visual disturbance.  Respiratory: Negative for cough, chest tightness, shortness of breath and wheezing.   Cardiovascular: Positive for leg swelling. Negative for chest pain and palpitations.  Gastrointestinal: Negative for abdominal pain, blood in stool, diarrhea, nausea and vomiting.  Genitourinary: Negative for difficulty urinating, dysuria, flank pain, frequency, hematuria and urgency.  Musculoskeletal: Negative for arthralgias, back pain, joint swelling, myalgias and neck stiffness.  Skin: Negative for pallor and rash.  Neurological: Negative for dizziness, speech difficulty, weakness and headaches.  Hematological: Negative for adenopathy. Does not bruise/bleed easily.  Psychiatric/Behavioral: Negative for confusion and sleep disturbance. The patient is not nervous/anxious.     PE; Blood pressure 138/70, pulse 69, temperature 98.1 F (36.7 C), temperature source Oral, resp. rate 16, height  (1.727 m), weight (!) 380 lb 2 oz (172.4 kg), SpO2 90  %. Body mass index is 57.8 kg/m.  Gen: Alert, well appearing, morbidly obese-appearing.  Patient is oriented to person, place, time, and situation. AFFECT: pleasant, lucid thought and speech. ENT: Ears: EACs clear, normal epithelium.  TMs with good light reflex and landmarks bilaterally.  Eyes: no injection, icteris, swelling, or exudate.  EOMI, PERRLA. Nose: no drainage or turbinate edema/swelling.  No injection or focal lesion.  Mouth: lips without lesion/swelling.  Oral mucosa pink and moist.  Dentition intact and without obvious caries or gingival swelling.  Oropharynx without erythema, exudate, or swelling.  Neck: supple/nontender.  No LAD, mass, or TM.  Carotid pulses 2+ bilaterally, without bruits. CV: Mildly irregular rhythm, rate 70s, no m/r/g.   LUNGS: CTA bilat, nonlabored resps, good aeration in all lung fields. ABD: soft, NT, ND, BS normal.  No hepatospenomegaly or mass.  No bruits. EXT: no clubbing, cyanosis, or edema.  Musculoskeletal: no joint swelling, erythema, warmth, or tenderness.  ROM of all joints intact. Skin - L foot has a few toes exposed and these are edematous and have a bit of weeping.  No signif erythema. Rectal: deferred  Pertinent labs:  Lab Results  Component Value Date   TSH 2.99 12/09/2016   Lab Results  Component Value Date   WBC 7.5 12/09/2016   HGB 12.7 (L) 12/09/2016   HCT 38.6 (L) 12/09/2016   MCV 84.4 12/09/2016   PLT 263.0 12/09/2016   Lab Results  Component Value Date   CREATININE 1.32 (H) 01/14/2018   BUN 18 01/14/2018   NA 142 01/14/2018   K 4.8 01/14/2018   CL 102 01/14/2018   CO2 25 01/14/2018   Lab Results  Component Value Date   ALT 14 12/09/2016   AST 15 12/09/2016   ALKPHOS 72 12/09/2016   BILITOT 1.1 12/09/2016   Lab Results  Component Value Date   CHOL 94 12/09/2016  Lab Results  Component Value Date   HDL 33.90 (L) 12/09/2016   Lab Results  Component Value Date   LDLCALC 51 12/09/2016   Lab Results   Component Value Date   TRIG 45.0 12/09/2016   Lab Results  Component Value Date   CHOLHDL 3 12/09/2016   Lab Results  Component Value Date   PSA 0.30 12/09/2016   PSA 0.21 12/11/2015   PSA 0.37 08/15/2014   Lab Results  Component Value Date   HGBA1C 6.9 (H) 12/08/2017    ASSESSMENT AND PLAN:   Health maintenance exam: Reviewed age and gender appropriate health maintenance issues (prudent diet, regular exercise, health risks of tobacco and excessive alcohol, use of seatbelts, fire alarms in home, use of sunscreen).  Also reviewed age and gender appropriate health screening as well as vaccine recommendations. Vaccines: all UTD.  Shingrix--rx sent to his pharmacy today. Labs: Fasting HP, HbA1c (DM 2), PSA. Prostate ca screening:  DRE deferred due to pt severe obesity and difficulty in getting into appropriate exam position, PSA done today. Colon ca screening: next screening colonoscopy (virtual?) due 2022.  Chronic bilat LL cellulitis: we'll restart monthly Pen G 1.67m U q month---this was given here today.  An After Visit Summary was printed and given to the patient.  FOLLOW UP:  Return in about 3 months (around 06/09/2018) for routine chronic illness f/u.  Nurse visit appt in 1 mo for penicillin injection.  Signed:  Santiago Bumpers, MD           03/09/2018

## 2018-03-09 NOTE — Patient Instructions (Signed)
Health Maintenance, Male A healthy lifestyle and preventive care is important for your health and wellness. Ask your health care provider about what schedule of regular examinations is right for you. What should I know about weight and diet? Eat a Healthy Diet  Eat plenty of vegetables, fruits, whole grains, low-fat dairy products, and lean protein.  Do not eat a lot of foods high in solid fats, added sugars, or salt.  Maintain a Healthy Weight Regular exercise can help you achieve or maintain a healthy weight. You should:  Do at least 150 minutes of exercise each week. The exercise should increase your heart rate and make you sweat (moderate-intensity exercise).  Do strength-training exercises at least twice a week.  Watch Your Levels of Cholesterol and Blood Lipids  Have your blood tested for lipids and cholesterol every 5 years starting at 67 years of age. If you are at high risk for heart disease, you should start having your blood tested when you are 67 years old. You may need to have your cholesterol levels checked more often if: ? Your lipid or cholesterol levels are high. ? You are older than 67 years of age. ? You are at high risk for heart disease.  What should I know about cancer screening? Many types of cancers can be detected early and may often be prevented. Lung Cancer  You should be screened every year for lung cancer if: ? You are a current smoker who has smoked for at least 30 years. ? You are a former smoker who has quit within the past 15 years.  Talk to your health care provider about your screening options, when you should start screening, and how often you should be screened.  Colorectal Cancer  Routine colorectal cancer screening usually begins at 67 years of age and should be repeated every 5-10 years until you are 67 years old. You may need to be screened more often if early forms of precancerous polyps or small growths are found. Your health care provider  may recommend screening at an earlier age if you have risk factors for colon cancer.  Your health care provider may recommend using home test kits to check for hidden blood in the stool.  A small camera at the end of a tube can be used to examine your colon (sigmoidoscopy or colonoscopy). This checks for the earliest forms of colorectal cancer.  Prostate and Testicular Cancer  Depending on your age and overall health, your health care provider may do certain tests to screen for prostate and testicular cancer.  Talk to your health care provider about any symptoms or concerns you have about testicular or prostate cancer.  Skin Cancer  Check your skin from head to toe regularly.  Tell your health care provider about any new moles or changes in moles, especially if: ? There is a change in a mole's size, shape, or color. ? You have a mole that is larger than a pencil eraser.  Always use sunscreen. Apply sunscreen liberally and repeat throughout the day.  Protect yourself by wearing long sleeves, pants, a wide-brimmed hat, and sunglasses when outside.  What should I know about heart disease, diabetes, and high blood pressure?  If you are 18-39 years of age, have your blood pressure checked every 3-5 years. If you are 40 years of age or older, have your blood pressure checked every year. You should have your blood pressure measured twice-once when you are at a hospital or clinic, and once when   you are not at a hospital or clinic. Record the average of the two measurements. To check your blood pressure when you are not at a hospital or clinic, you can use: ? An automated blood pressure machine at a pharmacy. ? A home blood pressure monitor.  Talk to your health care provider about your target blood pressure.  If you are between 45-79 years old, ask your health care provider if you should take aspirin to prevent heart disease.  Have regular diabetes screenings by checking your fasting blood  sugar level. ? If you are at a normal weight and have a low risk for diabetes, have this test once every three years after the age of 45. ? If you are overweight and have a high risk for diabetes, consider being tested at a younger age or more often.  A one-time screening for abdominal aortic aneurysm (AAA) by ultrasound is recommended for men aged 65-75 years who are current or former smokers. What should I know about preventing infection? Hepatitis B If you have a higher risk for hepatitis B, you should be screened for this virus. Talk with your health care provider to find out if you are at risk for hepatitis B infection. Hepatitis C Blood testing is recommended for:  Everyone born from 1945 through 1965.  Anyone with known risk factors for hepatitis C.  Sexually Transmitted Diseases (STDs)  You should be screened each year for STDs including gonorrhea and chlamydia if: ? You are sexually active and are younger than 67 years of age. ? You are older than 67 years of age and your health care provider tells you that you are at risk for this type of infection. ? Your sexual activity has changed since you were last screened and you are at an increased risk for chlamydia or gonorrhea. Ask your health care provider if you are at risk.  Talk with your health care provider about whether you are at high risk of being infected with HIV. Your health care provider may recommend a prescription medicine to help prevent HIV infection.  What else can I do?  Schedule regular health, dental, and eye exams.  Stay current with your vaccines (immunizations).  Do not use any tobacco products, such as cigarettes, chewing tobacco, and e-cigarettes. If you need help quitting, ask your health care provider.  Limit alcohol intake to no more than 2 drinks per day. One drink equals 12 ounces of beer, 5 ounces of wine, or 1 ounces of hard liquor.  Do not use street drugs.  Do not share needles.  Ask your  health care provider for help if you need support or information about quitting drugs.  Tell your health care provider if you often feel depressed.  Tell your health care provider if you have ever been abused or do not feel safe at home. This information is not intended to replace advice given to you by your health care provider. Make sure you discuss any questions you have with your health care provider. Document Released: 04/18/2008 Document Revised: 06/19/2016 Document Reviewed: 07/25/2015 Elsevier Interactive Patient Education  2018 Elsevier Inc.  

## 2018-03-10 ENCOUNTER — Other Ambulatory Visit (INDEPENDENT_AMBULATORY_CARE_PROVIDER_SITE_OTHER): Payer: Medicare Other

## 2018-03-10 ENCOUNTER — Encounter: Payer: Self-pay | Admitting: Family Medicine

## 2018-03-10 ENCOUNTER — Encounter: Payer: Self-pay | Admitting: *Deleted

## 2018-03-10 DIAGNOSIS — D649 Anemia, unspecified: Secondary | ICD-10-CM

## 2018-03-10 LAB — IBC PANEL
Iron: 45 ug/dL (ref 42–165)
SATURATION RATIOS: 12.6 % — AB (ref 20.0–50.0)
Transferrin: 256 mg/dL (ref 212.0–360.0)

## 2018-03-10 LAB — FERRITIN: FERRITIN: 26.4 ng/mL (ref 22.0–322.0)

## 2018-03-10 LAB — VITAMIN B12: VITAMIN B 12: 592 pg/mL (ref 211–911)

## 2018-03-12 DIAGNOSIS — L97522 Non-pressure chronic ulcer of other part of left foot with fat layer exposed: Secondary | ICD-10-CM | POA: Diagnosis not present

## 2018-03-12 DIAGNOSIS — I890 Lymphedema, not elsewhere classified: Secondary | ICD-10-CM | POA: Diagnosis not present

## 2018-03-12 DIAGNOSIS — L03116 Cellulitis of left lower limb: Secondary | ICD-10-CM | POA: Diagnosis not present

## 2018-03-12 DIAGNOSIS — E1140 Type 2 diabetes mellitus with diabetic neuropathy, unspecified: Secondary | ICD-10-CM | POA: Diagnosis not present

## 2018-03-12 DIAGNOSIS — I87333 Chronic venous hypertension (idiopathic) 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: Secondary | ICD-10-CM | POA: Diagnosis not present

## 2018-03-12 DIAGNOSIS — L97812 Non-pressure chronic ulcer of other part of right lower leg with fat layer exposed: Secondary | ICD-10-CM | POA: Diagnosis not present

## 2018-03-12 DIAGNOSIS — L97822 Non-pressure chronic ulcer of other part of left lower leg with fat layer exposed: Secondary | ICD-10-CM | POA: Diagnosis not present

## 2018-03-12 DIAGNOSIS — E11622 Type 2 diabetes mellitus with other skin ulcer: Secondary | ICD-10-CM | POA: Diagnosis not present

## 2018-03-12 DIAGNOSIS — E1151 Type 2 diabetes mellitus with diabetic peripheral angiopathy without gangrene: Secondary | ICD-10-CM | POA: Diagnosis not present

## 2018-03-12 DIAGNOSIS — I10 Essential (primary) hypertension: Secondary | ICD-10-CM | POA: Diagnosis not present

## 2018-03-12 DIAGNOSIS — L97819 Non-pressure chronic ulcer of other part of right lower leg with unspecified severity: Secondary | ICD-10-CM | POA: Diagnosis not present

## 2018-03-16 DIAGNOSIS — S91105A Unspecified open wound of left lesser toe(s) without damage to nail, initial encounter: Secondary | ICD-10-CM | POA: Diagnosis not present

## 2018-03-16 DIAGNOSIS — L97222 Non-pressure chronic ulcer of left calf with fat layer exposed: Secondary | ICD-10-CM | POA: Diagnosis not present

## 2018-03-16 DIAGNOSIS — I87333 Chronic venous hypertension (idiopathic) 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: Secondary | ICD-10-CM | POA: Diagnosis not present

## 2018-03-16 DIAGNOSIS — L03116 Cellulitis of left lower limb: Secondary | ICD-10-CM | POA: Diagnosis not present

## 2018-03-16 DIAGNOSIS — I890 Lymphedema, not elsewhere classified: Secondary | ICD-10-CM | POA: Diagnosis not present

## 2018-03-16 DIAGNOSIS — S81801A Unspecified open wound, right lower leg, initial encounter: Secondary | ICD-10-CM | POA: Diagnosis not present

## 2018-03-16 DIAGNOSIS — L97522 Non-pressure chronic ulcer of other part of left foot with fat layer exposed: Secondary | ICD-10-CM | POA: Diagnosis not present

## 2018-03-16 DIAGNOSIS — L97812 Non-pressure chronic ulcer of other part of right lower leg with fat layer exposed: Secondary | ICD-10-CM | POA: Diagnosis not present

## 2018-03-16 DIAGNOSIS — I10 Essential (primary) hypertension: Secondary | ICD-10-CM | POA: Diagnosis not present

## 2018-03-16 DIAGNOSIS — E1151 Type 2 diabetes mellitus with diabetic peripheral angiopathy without gangrene: Secondary | ICD-10-CM | POA: Diagnosis not present

## 2018-03-16 DIAGNOSIS — L97822 Non-pressure chronic ulcer of other part of left lower leg with fat layer exposed: Secondary | ICD-10-CM | POA: Diagnosis not present

## 2018-03-16 DIAGNOSIS — E11622 Type 2 diabetes mellitus with other skin ulcer: Secondary | ICD-10-CM | POA: Diagnosis not present

## 2018-03-16 DIAGNOSIS — E1140 Type 2 diabetes mellitus with diabetic neuropathy, unspecified: Secondary | ICD-10-CM | POA: Diagnosis not present

## 2018-03-16 DIAGNOSIS — I87332 Chronic venous hypertension (idiopathic) with ulcer and inflammation of left lower extremity: Secondary | ICD-10-CM | POA: Diagnosis not present

## 2018-03-16 DIAGNOSIS — L97819 Non-pressure chronic ulcer of other part of right lower leg with unspecified severity: Secondary | ICD-10-CM | POA: Diagnosis not present

## 2018-03-24 ENCOUNTER — Other Ambulatory Visit: Payer: Self-pay | Admitting: Family Medicine

## 2018-03-24 DIAGNOSIS — L97812 Non-pressure chronic ulcer of other part of right lower leg with fat layer exposed: Secondary | ICD-10-CM | POA: Diagnosis not present

## 2018-03-24 DIAGNOSIS — L97819 Non-pressure chronic ulcer of other part of right lower leg with unspecified severity: Secondary | ICD-10-CM | POA: Diagnosis not present

## 2018-03-24 DIAGNOSIS — I10 Essential (primary) hypertension: Secondary | ICD-10-CM | POA: Diagnosis not present

## 2018-03-24 DIAGNOSIS — L97222 Non-pressure chronic ulcer of left calf with fat layer exposed: Secondary | ICD-10-CM | POA: Diagnosis not present

## 2018-03-24 DIAGNOSIS — L97822 Non-pressure chronic ulcer of other part of left lower leg with fat layer exposed: Secondary | ICD-10-CM | POA: Diagnosis not present

## 2018-03-24 DIAGNOSIS — I890 Lymphedema, not elsewhere classified: Secondary | ICD-10-CM | POA: Diagnosis not present

## 2018-03-24 DIAGNOSIS — E11622 Type 2 diabetes mellitus with other skin ulcer: Secondary | ICD-10-CM | POA: Diagnosis not present

## 2018-03-24 DIAGNOSIS — L03116 Cellulitis of left lower limb: Secondary | ICD-10-CM | POA: Diagnosis not present

## 2018-03-24 DIAGNOSIS — E1151 Type 2 diabetes mellitus with diabetic peripheral angiopathy without gangrene: Secondary | ICD-10-CM | POA: Diagnosis not present

## 2018-03-24 DIAGNOSIS — I87333 Chronic venous hypertension (idiopathic) 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: Secondary | ICD-10-CM | POA: Diagnosis not present

## 2018-03-24 DIAGNOSIS — L97522 Non-pressure chronic ulcer of other part of left foot with fat layer exposed: Secondary | ICD-10-CM | POA: Diagnosis not present

## 2018-03-24 DIAGNOSIS — L97219 Non-pressure chronic ulcer of right calf with unspecified severity: Secondary | ICD-10-CM | POA: Diagnosis not present

## 2018-03-24 DIAGNOSIS — E1140 Type 2 diabetes mellitus with diabetic neuropathy, unspecified: Secondary | ICD-10-CM | POA: Diagnosis not present

## 2018-03-31 DIAGNOSIS — L97822 Non-pressure chronic ulcer of other part of left lower leg with fat layer exposed: Secondary | ICD-10-CM | POA: Diagnosis not present

## 2018-03-31 DIAGNOSIS — E1151 Type 2 diabetes mellitus with diabetic peripheral angiopathy without gangrene: Secondary | ICD-10-CM | POA: Diagnosis not present

## 2018-03-31 DIAGNOSIS — E1140 Type 2 diabetes mellitus with diabetic neuropathy, unspecified: Secondary | ICD-10-CM | POA: Diagnosis not present

## 2018-03-31 DIAGNOSIS — L97819 Non-pressure chronic ulcer of other part of right lower leg with unspecified severity: Secondary | ICD-10-CM | POA: Diagnosis not present

## 2018-03-31 DIAGNOSIS — L03115 Cellulitis of right lower limb: Secondary | ICD-10-CM | POA: Diagnosis not present

## 2018-03-31 DIAGNOSIS — I87333 Chronic venous hypertension (idiopathic) 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: Secondary | ICD-10-CM | POA: Diagnosis not present

## 2018-03-31 DIAGNOSIS — L03116 Cellulitis of left lower limb: Secondary | ICD-10-CM | POA: Diagnosis not present

## 2018-03-31 DIAGNOSIS — L97522 Non-pressure chronic ulcer of other part of left foot with fat layer exposed: Secondary | ICD-10-CM | POA: Diagnosis not present

## 2018-03-31 DIAGNOSIS — I890 Lymphedema, not elsewhere classified: Secondary | ICD-10-CM | POA: Diagnosis not present

## 2018-03-31 DIAGNOSIS — L97812 Non-pressure chronic ulcer of other part of right lower leg with fat layer exposed: Secondary | ICD-10-CM | POA: Diagnosis not present

## 2018-03-31 DIAGNOSIS — E11622 Type 2 diabetes mellitus with other skin ulcer: Secondary | ICD-10-CM | POA: Diagnosis not present

## 2018-03-31 DIAGNOSIS — I10 Essential (primary) hypertension: Secondary | ICD-10-CM | POA: Diagnosis not present

## 2018-03-31 DIAGNOSIS — S91105A Unspecified open wound of left lesser toe(s) without damage to nail, initial encounter: Secondary | ICD-10-CM | POA: Diagnosis not present

## 2018-04-06 ENCOUNTER — Encounter (HOSPITAL_BASED_OUTPATIENT_CLINIC_OR_DEPARTMENT_OTHER): Payer: Medicare Other | Attending: Internal Medicine

## 2018-04-06 ENCOUNTER — Ambulatory Visit (INDEPENDENT_AMBULATORY_CARE_PROVIDER_SITE_OTHER): Payer: Medicare Other

## 2018-04-06 DIAGNOSIS — L0390 Cellulitis, unspecified: Secondary | ICD-10-CM | POA: Diagnosis not present

## 2018-04-06 DIAGNOSIS — E1140 Type 2 diabetes mellitus with diabetic neuropathy, unspecified: Secondary | ICD-10-CM | POA: Diagnosis not present

## 2018-04-06 DIAGNOSIS — S91105A Unspecified open wound of left lesser toe(s) without damage to nail, initial encounter: Secondary | ICD-10-CM | POA: Diagnosis not present

## 2018-04-06 DIAGNOSIS — L97212 Non-pressure chronic ulcer of right calf with fat layer exposed: Secondary | ICD-10-CM | POA: Insufficient documentation

## 2018-04-06 DIAGNOSIS — S81801A Unspecified open wound, right lower leg, initial encounter: Secondary | ICD-10-CM | POA: Diagnosis not present

## 2018-04-06 DIAGNOSIS — L97521 Non-pressure chronic ulcer of other 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 of skin: Secondary | ICD-10-CM | POA: Insufficient documentation

## 2018-04-06 DIAGNOSIS — I87333 Chronic venous hypertension (idiopathic) 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: Secondary | ICD-10-CM | POA: Diagnosis not present

## 2018-04-06 DIAGNOSIS — I10 Essential (primary) hypertension: Secondary | ICD-10-CM | POA: Diagnosis not present

## 2018-04-06 DIAGNOSIS — I890 Lymphedema, not elsewhere classified: Secondary | ICD-10-CM | POA: Diagnosis not present

## 2018-04-06 DIAGNOSIS — L97221 Non-pressure chronic ulcer of left calf limited to breakdown of skin: Secondary | ICD-10-CM | POA: Insufficient documentation

## 2018-04-06 DIAGNOSIS — E11621 Type 2 diabetes mellitus with foot ulcer: Secondary | ICD-10-CM | POA: Diagnosis not present

## 2018-04-06 DIAGNOSIS — I87312 Chronic venous hypertension (idiopathic) with ulcer of left lower extremity: Secondary | ICD-10-CM | POA: Diagnosis not present

## 2018-04-06 DIAGNOSIS — L97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 right calf limited to breakdown of skin: Secondary | ICD-10-CM | POA: Diagnosis present

## 2018-04-06 DIAGNOSIS — L97222 Non-pressure chronic ulcer of left calf with fat layer exposed: Secondary | ICD-10-CM | POA: Diagnosis not present

## 2018-04-06 MED ORDER — PENICILLIN G BENZATHINE 1200000 UNIT/2ML IM SUSP
600000.0000 [IU] | Freq: Once | INTRAMUSCULAR | Status: AC
  Administered 2018-04-06: 600000 [IU] via INTRAMUSCULAR

## 2018-04-06 NOTE — Progress Notes (Signed)
Patient presents today for Bicillin LA 600,000 units per 1 ml x 2. Given with no incidence or problems. Patient last seen 03/09/18 for injections. Patient left with no complaints.

## 2018-04-08 DIAGNOSIS — L97521 Non-pressure chronic ulcer of other 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 of skin: Secondary | ICD-10-CM | POA: Diagnosis not present

## 2018-04-08 DIAGNOSIS — I10 Essential (primary) hypertension: Secondary | ICD-10-CM | POA: Diagnosis not present

## 2018-04-08 DIAGNOSIS — E11621 Type 2 diabetes mellitus with foot ulcer: Secondary | ICD-10-CM | POA: Diagnosis not present

## 2018-04-08 DIAGNOSIS — L97212 Non-pressure chronic ulcer of right calf with fat layer exposed: Secondary | ICD-10-CM | POA: Diagnosis not present

## 2018-04-08 DIAGNOSIS — E1140 Type 2 diabetes mellitus with diabetic neuropathy, unspecified: Secondary | ICD-10-CM | POA: Diagnosis not present

## 2018-04-08 DIAGNOSIS — I87333 Chronic venous hypertension (idiopathic) 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: Secondary | ICD-10-CM | POA: Diagnosis not present

## 2018-04-08 DIAGNOSIS — L97221 Non-pressure chronic ulcer of left calf limited to breakdown of skin: Secondary | ICD-10-CM | POA: Diagnosis not present

## 2018-04-08 DIAGNOSIS — I890 Lymphedema, not elsewhere classified: Secondary | ICD-10-CM | POA: Diagnosis not present

## 2018-04-09 ENCOUNTER — Other Ambulatory Visit: Payer: Self-pay | Admitting: Family Medicine

## 2018-04-10 NOTE — Telephone Encounter (Signed)
RF request for Fioricet  LOV: 03/09/18 Next ov: 06/08/18 Last written: 08/04/17 #90 w/ 1RF  Please advise. Thanks.

## 2018-04-13 DIAGNOSIS — L97521 Non-pressure chronic ulcer of other 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 of skin: Secondary | ICD-10-CM | POA: Diagnosis not present

## 2018-04-13 DIAGNOSIS — I87333 Chronic venous hypertension (idiopathic) 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: Secondary | ICD-10-CM | POA: Diagnosis not present

## 2018-04-13 DIAGNOSIS — E1140 Type 2 diabetes mellitus with diabetic neuropathy, unspecified: Secondary | ICD-10-CM | POA: Diagnosis not present

## 2018-04-13 DIAGNOSIS — L97212 Non-pressure chronic ulcer of right calf with fat layer exposed: Secondary | ICD-10-CM | POA: Diagnosis not present

## 2018-04-13 DIAGNOSIS — I890 Lymphedema, not elsewhere classified: Secondary | ICD-10-CM | POA: Diagnosis not present

## 2018-04-13 DIAGNOSIS — L97222 Non-pressure chronic ulcer of left calf with fat layer exposed: Secondary | ICD-10-CM | POA: Diagnosis not present

## 2018-04-13 DIAGNOSIS — E11621 Type 2 diabetes mellitus with foot ulcer: Secondary | ICD-10-CM | POA: Diagnosis not present

## 2018-04-13 DIAGNOSIS — I10 Essential (primary) hypertension: Secondary | ICD-10-CM | POA: Diagnosis not present

## 2018-04-13 DIAGNOSIS — L97221 Non-pressure chronic ulcer of left calf limited to breakdown of skin: Secondary | ICD-10-CM | POA: Diagnosis not present

## 2018-04-13 DIAGNOSIS — I87312 Chronic venous hypertension (idiopathic) with ulcer of left lower extremity: Secondary | ICD-10-CM | POA: Diagnosis not present

## 2018-04-15 ENCOUNTER — Ambulatory Visit: Payer: Medicare Other | Admitting: Registered"

## 2018-04-16 ENCOUNTER — Encounter: Payer: Medicare Other | Attending: Family Medicine | Admitting: Registered"

## 2018-04-16 DIAGNOSIS — E1165 Type 2 diabetes mellitus with hyperglycemia: Secondary | ICD-10-CM

## 2018-04-16 DIAGNOSIS — Z713 Dietary counseling and surveillance: Secondary | ICD-10-CM | POA: Diagnosis not present

## 2018-04-16 DIAGNOSIS — E118 Type 2 diabetes mellitus with unspecified complications: Secondary | ICD-10-CM | POA: Insufficient documentation

## 2018-04-16 NOTE — Progress Notes (Signed)
Diabetes Self-Management Education  Visit Type: Follow-up  Appt. Start Time: 1030 Appt. End Time: 1100  04/16/2018  [name], identified by name and date of birth, is a 67 y.o. male with a diagnosis of Diabetes:  .   ASSESSMENT Pt states he feels woosy when BG is below 90 mg/dL. According to his glucometer 6/2 -162 mg/dL; 6/196/12 - 509201 mg/dL.   Pt states his BP was low during wound care visit (every Monday) and feeling sleepy and nauseous. Patient reduced BP medication on own, does not take 1/2 tablet. Pt states he will talk to MD next [place] visit about change he made to medication.  Patient states his stressed is reduced since he stopped working March 1.  Pt states on a recent trip he bought a special chocolate candy from [name] and has been eating that as snacks. Pt states he has cut back sodas 2-3x week instead of daily.  Diabetes Self-Management Education - 04/16/18 1000      Visit Information   Visit Type  Follow-up      Initial Visit   Are you taking your medications as prescribed?  No      Complications   Last HgB A1C per patient/outside source  6.8 %    How often do you check your blood sugar?  1-2 times/day    Fasting Blood glucose range (mg/dL)  32-671;245-809;>98370-129;180-200;>200    Postprandial Blood glucose range (mg/dL)  -- same as fasting    Number of hyperglycemic episodes per week  2      Dietary Intake   Breakfast  sausage, egg mcgriddle, & coffee, cream    Snack (morning)  chocolate    Lunch  none (on the road)    Dinner  fish, coleslaw, pepsi    Snack (evening)  chocolate    Beverage(s)  water, milk, soda 2-3x week      Exercise   Exercise Type  ADL's      Patient Education   Nutrition management   Other (comment) non-hunger eating    Physical activity and exercise   Role of exercise on diabetes management, blood pressure control and cardiac health.    Monitoring  Taught/discussed recording of test results and interpretation of SMBG.      Individualized Goals (developed by patient)   Nutrition  General guidelines for healthy choices and portions discussed;Other (comment) consider non-hunger eating class    Physical Activity  15 minutes per day    Monitoring   test my blood glucose as discussed;Other (comment) keep BG log with notes      Outcomes   Expected Outcomes  Demonstrated interest in learning. Expect positive outcomes    Future DMSE  PRN    Program Status  Completed      Subsequent Visit   Since your last visit have you continued or begun to take your medications as prescribed?  No still taking DM meds as prescribed    Since your last visit have you had your blood pressure checked?  Yes    Is your most recent blood pressure lower, unchanged, or higher since your last visit?  Lower    Since your last visit have you experienced any weight changes?  No change    Since your last visit, are you checking your blood glucose at least once a day?  Yes     Individualized Plan for Diabetes Self-Management Training:   Learning Objective:  Patient will have a greater understanding of diabetes self-management. Patient  education plan is to attend individual and/or group sessions per assessed needs and concerns.   Patient Instructions  Randie Heinz job on cutting back sodas! Continue using the rolater to get plenty of movement in each day. To help you get your fasting blood sugar number below 200 consistently consider getting a notebook and track: Evening insulin dosage, evening meal with serving portion size, fasting blood sugar the next morning. Take this information to your doctor's visit as well as visits with me. Watch the [email or web address] website for Living well: Non-hunger eating 4 week class  Expected Outcomes:  Demonstrated interest in learning. Expect positive outcomes  Education material provided: none  If problems or questions, patient to contact team via:  Phone and MyChart  Future DSME appointment: PRN

## 2018-04-16 NOTE — Patient Instructions (Addendum)
Great job on cutting back sodas! Continue using the rolater to get plenty of movement in each day. To help you get your fasting blood sugar number below 200 consistently consider getting a notebook and track: Evening insulin dosage, evening meal with serving portion size, fasting blood sugar the next morning. Take this information to your doctor's visit as well as visits with me. Watch the [email or web address] website for Living well: Non-hunger eating 4 week class

## 2018-04-20 DIAGNOSIS — I10 Essential (primary) hypertension: Secondary | ICD-10-CM | POA: Diagnosis not present

## 2018-04-20 DIAGNOSIS — L97521 Non-pressure chronic ulcer of other 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 of skin: Secondary | ICD-10-CM | POA: Diagnosis not present

## 2018-04-20 DIAGNOSIS — L97812 Non-pressure chronic ulcer of other part of right lower leg with fat layer exposed: Secondary | ICD-10-CM | POA: Diagnosis not present

## 2018-04-20 DIAGNOSIS — E1140 Type 2 diabetes mellitus with diabetic neuropathy, unspecified: Secondary | ICD-10-CM | POA: Diagnosis not present

## 2018-04-20 DIAGNOSIS — L97212 Non-pressure chronic ulcer of right calf with fat layer exposed: Secondary | ICD-10-CM | POA: Diagnosis not present

## 2018-04-20 DIAGNOSIS — I87312 Chronic venous hypertension (idiopathic) with ulcer of left lower extremity: Secondary | ICD-10-CM | POA: Diagnosis not present

## 2018-04-20 DIAGNOSIS — L97222 Non-pressure chronic ulcer of left calf with fat layer exposed: Secondary | ICD-10-CM | POA: Diagnosis not present

## 2018-04-20 DIAGNOSIS — I87333 Chronic venous hypertension (idiopathic) 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: Secondary | ICD-10-CM | POA: Diagnosis not present

## 2018-04-20 DIAGNOSIS — I890 Lymphedema, not elsewhere classified: Secondary | ICD-10-CM | POA: Diagnosis not present

## 2018-04-20 DIAGNOSIS — E11621 Type 2 diabetes mellitus with foot ulcer: Secondary | ICD-10-CM | POA: Diagnosis not present

## 2018-04-20 DIAGNOSIS — L97221 Non-pressure chronic ulcer of left calf limited to breakdown of skin: Secondary | ICD-10-CM | POA: Diagnosis not present

## 2018-04-21 ENCOUNTER — Encounter: Payer: Self-pay | Admitting: Family Medicine

## 2018-04-21 NOTE — Telephone Encounter (Signed)
Have pt call his insurer to check if eliquis or savaysa are on the list of preferred medications. Let me know-thx

## 2018-04-21 NOTE — Telephone Encounter (Signed)
Please advise. Thanks.  

## 2018-04-22 MED ORDER — APIXABAN 5 MG PO TABS: 5.0000 mg | ORAL_TABLET | Freq: Two times a day (BID) | ORAL | 3 refills | Status: DC

## 2018-04-22 NOTE — Telephone Encounter (Signed)
OK, I sent eliquis to [facility]. He should finish out his xarelto and start the eliquis the day after he runs out of xarelto.-thx

## 2018-04-23 NOTE — Telephone Encounter (Signed)
I'm pretty sure I sent a message directly to him yesterday evening stating that I eRx'd eliquis to [facility] and he should finish his xarelto and then start eliquis the next day.-thx

## 2018-04-23 NOTE — Telephone Encounter (Signed)
Please advise. Thanks.  

## 2018-04-27 DIAGNOSIS — I87333 Chronic venous hypertension (idiopathic) 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: Secondary | ICD-10-CM | POA: Diagnosis not present

## 2018-04-27 DIAGNOSIS — I87312 Chronic venous hypertension (idiopathic) with ulcer of left lower extremity: Secondary | ICD-10-CM | POA: Diagnosis not present

## 2018-04-27 DIAGNOSIS — L97212 Non-pressure chronic ulcer of right calf with fat layer exposed: Secondary | ICD-10-CM | POA: Diagnosis not present

## 2018-04-27 DIAGNOSIS — L97522 Non-pressure chronic ulcer of other part of left foot with fat layer exposed: Secondary | ICD-10-CM | POA: Diagnosis not present

## 2018-04-27 DIAGNOSIS — L97229 Non-pressure chronic ulcer of left calf with unspecified severity: Secondary | ICD-10-CM | POA: Diagnosis not present

## 2018-04-27 DIAGNOSIS — E11621 Type 2 diabetes mellitus with foot ulcer: Secondary | ICD-10-CM | POA: Diagnosis not present

## 2018-04-27 DIAGNOSIS — I890 Lymphedema, not elsewhere classified: Secondary | ICD-10-CM | POA: Diagnosis not present

## 2018-04-27 DIAGNOSIS — L97221 Non-pressure chronic ulcer of left calf limited to breakdown of skin: Secondary | ICD-10-CM | POA: Diagnosis not present

## 2018-04-27 DIAGNOSIS — E1140 Type 2 diabetes mellitus with diabetic neuropathy, unspecified: Secondary | ICD-10-CM | POA: Diagnosis not present

## 2018-04-27 DIAGNOSIS — I10 Essential (primary) hypertension: Secondary | ICD-10-CM | POA: Diagnosis not present

## 2018-04-27 DIAGNOSIS — L97521 Non-pressure chronic ulcer of other 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 of skin: Secondary | ICD-10-CM | POA: Diagnosis not present

## 2018-05-04 ENCOUNTER — Ambulatory Visit (INDEPENDENT_AMBULATORY_CARE_PROVIDER_SITE_OTHER): Payer: Medicare Other

## 2018-05-04 ENCOUNTER — Encounter (HOSPITAL_BASED_OUTPATIENT_CLINIC_OR_DEPARTMENT_OTHER): Payer: Medicare Other | Attending: Internal Medicine

## 2018-05-04 DIAGNOSIS — L97812 Non-pressure chronic ulcer of other part of right lower leg with fat layer exposed: Secondary | ICD-10-CM | POA: Diagnosis not present

## 2018-05-04 DIAGNOSIS — I10 Essential (primary) hypertension: Secondary | ICD-10-CM | POA: Insufficient documentation

## 2018-05-04 DIAGNOSIS — L97822 Non-pressure chronic ulcer of other part of left lower leg with fat layer exposed: Secondary | ICD-10-CM | POA: Diagnosis not present

## 2018-05-04 DIAGNOSIS — I890 Lymphedema, not elsewhere classified: Secondary | ICD-10-CM | POA: Insufficient documentation

## 2018-05-04 DIAGNOSIS — E1151 Type 2 diabetes mellitus with diabetic peripheral angiopathy without gangrene: Secondary | ICD-10-CM | POA: Insufficient documentation

## 2018-05-04 DIAGNOSIS — L97829 Non-pressure chronic ulcer of other part of left lower leg with unspecified severity: Secondary | ICD-10-CM | POA: Diagnosis not present

## 2018-05-04 DIAGNOSIS — L03116 Cellulitis of left lower limb: Secondary | ICD-10-CM | POA: Insufficient documentation

## 2018-05-04 DIAGNOSIS — I87333 Chronic venous hypertension (idiopathic) 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: Secondary | ICD-10-CM | POA: Diagnosis not present

## 2018-05-04 DIAGNOSIS — L97529 Non-pressure chronic ulcer of other part of left foot with unspecified severity: Secondary | ICD-10-CM | POA: Insufficient documentation

## 2018-05-04 DIAGNOSIS — L0390 Cellulitis, unspecified: Secondary | ICD-10-CM

## 2018-05-04 DIAGNOSIS — E11622 Type 2 diabetes mellitus with other skin ulcer: Secondary | ICD-10-CM | POA: Insufficient documentation

## 2018-05-04 DIAGNOSIS — E1140 Type 2 diabetes mellitus with diabetic neuropathy, unspecified: Secondary | ICD-10-CM | POA: Insufficient documentation

## 2018-05-04 DIAGNOSIS — L97222 Non-pressure chronic ulcer of left calf with fat layer exposed: Secondary | ICD-10-CM | POA: Diagnosis not present

## 2018-05-04 DIAGNOSIS — L97522 Non-pressure chronic ulcer of other part of left foot with fat layer exposed: Secondary | ICD-10-CM | POA: Diagnosis not present

## 2018-05-04 DIAGNOSIS — L97212 Non-pressure chronic ulcer of right calf with fat layer exposed: Secondary | ICD-10-CM | POA: Diagnosis not present

## 2018-05-04 MED ORDER — PENICILLIN G BENZATHINE 1200000 UNIT/2ML IM SUSP
600000.0000 [IU] | Freq: Once | INTRAMUSCULAR | Status: AC
  Administered 2018-05-04: 600000 [IU] via INTRAMUSCULAR

## 2018-05-04 NOTE — Progress Notes (Signed)
Patient presents today for Bicillin LA 600,000 units per 1 ml x 2. Given with no incidence or problems. Patient last seen 04/06/18 for injections. Patient left with no complaints.

## 2018-05-04 NOTE — Progress Notes (Signed)
Pt has chronic bilat LE cellulitis and I agree with the plan of getting monthly penicillin injections here in our office per the recommendation of infectious dz specialist. Signed:  Santiago BumpersPhil Gussie Towson, MD           05/04/2018

## 2018-05-11 DIAGNOSIS — I890 Lymphedema, not elsewhere classified: Secondary | ICD-10-CM | POA: Diagnosis not present

## 2018-05-11 DIAGNOSIS — E1151 Type 2 diabetes mellitus with diabetic peripheral angiopathy without gangrene: Secondary | ICD-10-CM | POA: Diagnosis not present

## 2018-05-11 DIAGNOSIS — L97529 Non-pressure chronic ulcer of other part of left foot with unspecified severity: Secondary | ICD-10-CM | POA: Diagnosis not present

## 2018-05-11 DIAGNOSIS — L03116 Cellulitis of left lower limb: Secondary | ICD-10-CM | POA: Diagnosis not present

## 2018-05-11 DIAGNOSIS — E11622 Type 2 diabetes mellitus with other skin ulcer: Secondary | ICD-10-CM | POA: Diagnosis not present

## 2018-05-11 DIAGNOSIS — L97812 Non-pressure chronic ulcer of other part of right lower leg with fat layer exposed: Secondary | ICD-10-CM | POA: Diagnosis not present

## 2018-05-11 DIAGNOSIS — L97822 Non-pressure chronic ulcer of other part of left lower leg with fat layer exposed: Secondary | ICD-10-CM | POA: Diagnosis not present

## 2018-05-11 DIAGNOSIS — I87333 Chronic venous hypertension (idiopathic) 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: Secondary | ICD-10-CM | POA: Diagnosis not present

## 2018-05-11 DIAGNOSIS — L97829 Non-pressure chronic ulcer of other part of left lower leg with unspecified severity: Secondary | ICD-10-CM | POA: Diagnosis not present

## 2018-05-11 DIAGNOSIS — E1140 Type 2 diabetes mellitus with diabetic neuropathy, unspecified: Secondary | ICD-10-CM | POA: Diagnosis not present

## 2018-05-11 DIAGNOSIS — L97212 Non-pressure chronic ulcer of right calf with fat layer exposed: Secondary | ICD-10-CM | POA: Diagnosis not present

## 2018-05-11 DIAGNOSIS — L97222 Non-pressure chronic ulcer of left calf with fat layer exposed: Secondary | ICD-10-CM | POA: Diagnosis not present

## 2018-05-11 DIAGNOSIS — I10 Essential (primary) hypertension: Secondary | ICD-10-CM | POA: Diagnosis not present

## 2018-05-11 DIAGNOSIS — L97522 Non-pressure chronic ulcer of other part of left foot with fat layer exposed: Secondary | ICD-10-CM | POA: Diagnosis not present

## 2018-05-15 DIAGNOSIS — I87333 Chronic venous hypertension (idiopathic) 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: Secondary | ICD-10-CM | POA: Diagnosis not present

## 2018-05-15 DIAGNOSIS — L97812 Non-pressure chronic ulcer of other part of right lower leg with fat layer exposed: Secondary | ICD-10-CM | POA: Diagnosis not present

## 2018-05-15 DIAGNOSIS — E1140 Type 2 diabetes mellitus with diabetic neuropathy, unspecified: Secondary | ICD-10-CM | POA: Diagnosis not present

## 2018-05-15 DIAGNOSIS — L97822 Non-pressure chronic ulcer of other part of left lower leg with fat layer exposed: Secondary | ICD-10-CM | POA: Diagnosis not present

## 2018-05-15 DIAGNOSIS — I890 Lymphedema, not elsewhere classified: Secondary | ICD-10-CM | POA: Diagnosis not present

## 2018-05-15 DIAGNOSIS — I10 Essential (primary) hypertension: Secondary | ICD-10-CM | POA: Diagnosis not present

## 2018-05-15 DIAGNOSIS — L97829 Non-pressure chronic ulcer of other part of left lower leg with unspecified severity: Secondary | ICD-10-CM | POA: Diagnosis not present

## 2018-05-15 DIAGNOSIS — E1151 Type 2 diabetes mellitus with diabetic peripheral angiopathy without gangrene: Secondary | ICD-10-CM | POA: Diagnosis not present

## 2018-05-15 DIAGNOSIS — L03116 Cellulitis of left lower limb: Secondary | ICD-10-CM | POA: Diagnosis not present

## 2018-05-15 DIAGNOSIS — L97529 Non-pressure chronic ulcer of other part of left foot with unspecified severity: Secondary | ICD-10-CM | POA: Diagnosis not present

## 2018-05-15 DIAGNOSIS — E11622 Type 2 diabetes mellitus with other skin ulcer: Secondary | ICD-10-CM | POA: Diagnosis not present

## 2018-05-18 DIAGNOSIS — L97529 Non-pressure chronic ulcer of other part of left foot with unspecified severity: Secondary | ICD-10-CM | POA: Diagnosis not present

## 2018-05-18 DIAGNOSIS — S81802A Unspecified open wound, left lower leg, initial encounter: Secondary | ICD-10-CM | POA: Diagnosis not present

## 2018-05-18 DIAGNOSIS — I87333 Chronic venous hypertension (idiopathic) 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: Secondary | ICD-10-CM | POA: Diagnosis not present

## 2018-05-18 DIAGNOSIS — E1140 Type 2 diabetes mellitus with diabetic neuropathy, unspecified: Secondary | ICD-10-CM | POA: Diagnosis not present

## 2018-05-18 DIAGNOSIS — L97812 Non-pressure chronic ulcer of other part of right lower leg with fat layer exposed: Secondary | ICD-10-CM | POA: Diagnosis not present

## 2018-05-18 DIAGNOSIS — L988 Other specified disorders of the skin and subcutaneous tissue: Secondary | ICD-10-CM | POA: Diagnosis not present

## 2018-05-18 DIAGNOSIS — L03116 Cellulitis of left lower limb: Secondary | ICD-10-CM | POA: Diagnosis not present

## 2018-05-18 DIAGNOSIS — I10 Essential (primary) hypertension: Secondary | ICD-10-CM | POA: Diagnosis not present

## 2018-05-18 DIAGNOSIS — I890 Lymphedema, not elsewhere classified: Secondary | ICD-10-CM | POA: Diagnosis not present

## 2018-05-18 DIAGNOSIS — L97829 Non-pressure chronic ulcer of other part of left lower leg with unspecified severity: Secondary | ICD-10-CM | POA: Diagnosis not present

## 2018-05-18 DIAGNOSIS — S81801A Unspecified open wound, right lower leg, initial encounter: Secondary | ICD-10-CM | POA: Diagnosis not present

## 2018-05-18 DIAGNOSIS — I87312 Chronic venous hypertension (idiopathic) with ulcer of left lower extremity: Secondary | ICD-10-CM | POA: Diagnosis not present

## 2018-05-18 DIAGNOSIS — E1151 Type 2 diabetes mellitus with diabetic peripheral angiopathy without gangrene: Secondary | ICD-10-CM | POA: Diagnosis not present

## 2018-05-18 DIAGNOSIS — L97822 Non-pressure chronic ulcer of other part of left lower leg with fat layer exposed: Secondary | ICD-10-CM | POA: Diagnosis not present

## 2018-05-18 DIAGNOSIS — E11622 Type 2 diabetes mellitus with other skin ulcer: Secondary | ICD-10-CM | POA: Diagnosis not present

## 2018-05-21 DIAGNOSIS — I87332 Chronic venous hypertension (idiopathic) with ulcer and inflammation of left lower extremity: Secondary | ICD-10-CM | POA: Diagnosis not present

## 2018-05-21 DIAGNOSIS — I890 Lymphedema, not elsewhere classified: Secondary | ICD-10-CM | POA: Diagnosis not present

## 2018-05-21 DIAGNOSIS — S81802A Unspecified open wound, left lower leg, initial encounter: Secondary | ICD-10-CM | POA: Diagnosis not present

## 2018-05-21 DIAGNOSIS — L988 Other specified disorders of the skin and subcutaneous tissue: Secondary | ICD-10-CM | POA: Diagnosis not present

## 2018-05-21 DIAGNOSIS — S81801A Unspecified open wound, right lower leg, initial encounter: Secondary | ICD-10-CM | POA: Diagnosis not present

## 2018-05-25 DIAGNOSIS — S81802A Unspecified open wound, left lower leg, initial encounter: Secondary | ICD-10-CM | POA: Diagnosis not present

## 2018-05-25 DIAGNOSIS — E1140 Type 2 diabetes mellitus with diabetic neuropathy, unspecified: Secondary | ICD-10-CM | POA: Diagnosis not present

## 2018-05-25 DIAGNOSIS — S81801A Unspecified open wound, right lower leg, initial encounter: Secondary | ICD-10-CM | POA: Diagnosis not present

## 2018-05-25 DIAGNOSIS — I890 Lymphedema, not elsewhere classified: Secondary | ICD-10-CM | POA: Diagnosis not present

## 2018-05-25 DIAGNOSIS — I87333 Chronic venous hypertension (idiopathic) 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: Secondary | ICD-10-CM | POA: Diagnosis not present

## 2018-05-25 DIAGNOSIS — E1151 Type 2 diabetes mellitus with diabetic peripheral angiopathy without gangrene: Secondary | ICD-10-CM | POA: Diagnosis not present

## 2018-05-25 DIAGNOSIS — L97829 Non-pressure chronic ulcer of other part of left lower leg with unspecified severity: Secondary | ICD-10-CM | POA: Diagnosis not present

## 2018-05-25 DIAGNOSIS — L97822 Non-pressure chronic ulcer of other part of left lower leg with fat layer exposed: Secondary | ICD-10-CM | POA: Diagnosis not present

## 2018-05-25 DIAGNOSIS — L97812 Non-pressure chronic ulcer of other part of right lower leg with fat layer exposed: Secondary | ICD-10-CM | POA: Diagnosis not present

## 2018-05-25 DIAGNOSIS — L988 Other specified disorders of the skin and subcutaneous tissue: Secondary | ICD-10-CM | POA: Diagnosis not present

## 2018-05-25 DIAGNOSIS — L97529 Non-pressure chronic ulcer of other part of left foot with unspecified severity: Secondary | ICD-10-CM | POA: Diagnosis not present

## 2018-05-25 DIAGNOSIS — S91105A Unspecified open wound of left lesser toe(s) without damage to nail, initial encounter: Secondary | ICD-10-CM | POA: Diagnosis not present

## 2018-05-25 DIAGNOSIS — E11622 Type 2 diabetes mellitus with other skin ulcer: Secondary | ICD-10-CM | POA: Diagnosis not present

## 2018-05-25 DIAGNOSIS — I10 Essential (primary) hypertension: Secondary | ICD-10-CM | POA: Diagnosis not present

## 2018-05-25 DIAGNOSIS — L03116 Cellulitis of left lower limb: Secondary | ICD-10-CM | POA: Diagnosis not present

## 2018-05-28 ENCOUNTER — Other Ambulatory Visit: Payer: Self-pay | Admitting: Family Medicine

## 2018-06-01 DIAGNOSIS — L97529 Non-pressure chronic ulcer of other part of left foot with unspecified severity: Secondary | ICD-10-CM | POA: Diagnosis not present

## 2018-06-01 DIAGNOSIS — L97522 Non-pressure chronic ulcer of other part of left foot with fat layer exposed: Secondary | ICD-10-CM | POA: Diagnosis not present

## 2018-06-01 DIAGNOSIS — L97229 Non-pressure chronic ulcer of left calf with unspecified severity: Secondary | ICD-10-CM | POA: Diagnosis not present

## 2018-06-01 DIAGNOSIS — L97829 Non-pressure chronic ulcer of other part of left lower leg with unspecified severity: Secondary | ICD-10-CM | POA: Diagnosis not present

## 2018-06-01 DIAGNOSIS — L97212 Non-pressure chronic ulcer of right calf with fat layer exposed: Secondary | ICD-10-CM | POA: Diagnosis not present

## 2018-06-01 DIAGNOSIS — E11621 Type 2 diabetes mellitus with foot ulcer: Secondary | ICD-10-CM | POA: Diagnosis not present

## 2018-06-01 DIAGNOSIS — I890 Lymphedema, not elsewhere classified: Secondary | ICD-10-CM | POA: Diagnosis not present

## 2018-06-01 DIAGNOSIS — L97812 Non-pressure chronic ulcer of other part of right lower leg with fat layer exposed: Secondary | ICD-10-CM | POA: Diagnosis not present

## 2018-06-01 DIAGNOSIS — E1140 Type 2 diabetes mellitus with diabetic neuropathy, unspecified: Secondary | ICD-10-CM | POA: Diagnosis not present

## 2018-06-01 DIAGNOSIS — I87333 Chronic venous hypertension (idiopathic) 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: Secondary | ICD-10-CM | POA: Diagnosis not present

## 2018-06-01 DIAGNOSIS — E11622 Type 2 diabetes mellitus with other skin ulcer: Secondary | ICD-10-CM | POA: Diagnosis not present

## 2018-06-01 DIAGNOSIS — L03116 Cellulitis of left lower limb: Secondary | ICD-10-CM | POA: Diagnosis not present

## 2018-06-01 DIAGNOSIS — E1151 Type 2 diabetes mellitus with diabetic peripheral angiopathy without gangrene: Secondary | ICD-10-CM | POA: Diagnosis not present

## 2018-06-01 DIAGNOSIS — I10 Essential (primary) hypertension: Secondary | ICD-10-CM | POA: Diagnosis not present

## 2018-06-01 DIAGNOSIS — L97822 Non-pressure chronic ulcer of other part of left lower leg with fat layer exposed: Secondary | ICD-10-CM | POA: Diagnosis not present

## 2018-06-02 ENCOUNTER — Ambulatory Visit: Payer: Medicare Other | Admitting: Infectious Diseases

## 2018-06-04 DIAGNOSIS — A4902 Methicillin resistant Staphylococcus aureus infection, unspecified site: Secondary | ICD-10-CM

## 2018-06-04 HISTORY — DX: Methicillin resistant Staphylococcus aureus infection, unspecified site: A49.02

## 2018-06-05 ENCOUNTER — Encounter (HOSPITAL_BASED_OUTPATIENT_CLINIC_OR_DEPARTMENT_OTHER): Payer: Medicare Other | Attending: Internal Medicine

## 2018-06-05 DIAGNOSIS — E1140 Type 2 diabetes mellitus with diabetic neuropathy, unspecified: Secondary | ICD-10-CM | POA: Diagnosis not present

## 2018-06-05 DIAGNOSIS — I87333 Chronic venous hypertension (idiopathic) 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: Secondary | ICD-10-CM | POA: Diagnosis not present

## 2018-06-05 DIAGNOSIS — I10 Essential (primary) hypertension: Secondary | ICD-10-CM | POA: Insufficient documentation

## 2018-06-05 DIAGNOSIS — L97822 Non-pressure chronic ulcer of other part of left lower leg with fat layer exposed: Secondary | ICD-10-CM | POA: Insufficient documentation

## 2018-06-05 DIAGNOSIS — L97812 Non-pressure chronic ulcer of other part of right lower leg with fat layer exposed: Secondary | ICD-10-CM | POA: Diagnosis not present

## 2018-06-05 DIAGNOSIS — I890 Lymphedema, not elsewhere classified: Secondary | ICD-10-CM | POA: Diagnosis not present

## 2018-06-05 DIAGNOSIS — B9562 Methicillin resistant Staphylococcus aureus infection as the cause of diseases classified elsewhere: Secondary | ICD-10-CM | POA: Insufficient documentation

## 2018-06-05 DIAGNOSIS — E11622 Type 2 diabetes mellitus with other skin ulcer: Secondary | ICD-10-CM | POA: Insufficient documentation

## 2018-06-05 DIAGNOSIS — Z794 Long term (current) use of insulin: Secondary | ICD-10-CM | POA: Insufficient documentation

## 2018-06-08 ENCOUNTER — Encounter: Payer: Self-pay | Admitting: Family Medicine

## 2018-06-08 ENCOUNTER — Ambulatory Visit (INDEPENDENT_AMBULATORY_CARE_PROVIDER_SITE_OTHER): Payer: Medicare Other | Admitting: Family Medicine

## 2018-06-08 VITALS — BP 140/64 | HR 65 | Temp 97.9°F | Resp 16 | Ht 68.0 in | Wt 373.1 lb

## 2018-06-08 DIAGNOSIS — I10 Essential (primary) hypertension: Secondary | ICD-10-CM

## 2018-06-08 DIAGNOSIS — I482 Chronic atrial fibrillation, unspecified: Secondary | ICD-10-CM

## 2018-06-08 DIAGNOSIS — L0390 Cellulitis, unspecified: Secondary | ICD-10-CM | POA: Diagnosis not present

## 2018-06-08 DIAGNOSIS — N182 Chronic kidney disease, stage 2 (mild): Secondary | ICD-10-CM

## 2018-06-08 DIAGNOSIS — R7989 Other specified abnormal findings of blood chemistry: Secondary | ICD-10-CM

## 2018-06-08 DIAGNOSIS — D649 Anemia, unspecified: Secondary | ICD-10-CM

## 2018-06-08 DIAGNOSIS — E118 Type 2 diabetes mellitus with unspecified complications: Secondary | ICD-10-CM | POA: Diagnosis not present

## 2018-06-08 DIAGNOSIS — Z794 Long term (current) use of insulin: Secondary | ICD-10-CM | POA: Diagnosis not present

## 2018-06-08 LAB — T4, FREE: Free T4: 1.11 ng/dL (ref 0.60–1.60)

## 2018-06-08 LAB — BASIC METABOLIC PANEL
BUN: 12 mg/dL (ref 6–23)
CALCIUM: 8.9 mg/dL (ref 8.4–10.5)
CO2: 29 meq/L (ref 19–32)
Chloride: 102 mEq/L (ref 96–112)
Creatinine, Ser: 1.18 mg/dL (ref 0.40–1.50)
GFR: 65.41 mL/min (ref >60.00)
GLUCOSE: 168 mg/dL — AB (ref 70–99)
Potassium: 4.4 mEq/L (ref 3.5–5.1)
SODIUM: 140 meq/L (ref 135–145)

## 2018-06-08 LAB — CBC WITH DIFFERENTIAL/PLATELET
BASOS ABS: 0.1 10*3/uL (ref 0.0–0.1)
BASOS PCT: 1.3 % (ref 0.0–3.0)
Eosinophils Absolute: 0.5 10*3/uL (ref 0.0–0.7)
Eosinophils Relative: 7.6 % — ABNORMAL HIGH (ref 0.0–5.0)
HEMATOCRIT: 36.2 % — AB (ref 39.0–52.0)
HEMOGLOBIN: 12 g/dL — AB (ref 13.0–17.0)
LYMPHS PCT: 19.2 % (ref 12.0–46.0)
Lymphs Abs: 1.2 10*3/uL (ref 0.7–4.0)
MCHC: 33.1 g/dL (ref 30.0–36.0)
MCV: 81.1 fl (ref 78.0–100.0)
Monocytes Absolute: 0.5 10*3/uL (ref 0.1–1.0)
Monocytes Relative: 8.1 % (ref 3.0–12.0)
Neutro Abs: 3.9 10*3/uL (ref 1.4–7.7)
Neutrophils Relative %: 63.8 % (ref 43.0–77.0)
PLATELETS: 222 10*3/uL (ref 150.0–400.0)
RBC: 4.47 Mil/uL (ref 4.22–5.81)
RDW: 19.1 % — AB (ref 11.5–15.5)
WBC: 6.1 10*3/uL (ref 4.0–10.5)

## 2018-06-08 LAB — HEMOGLOBIN A1C: Hgb A1c MFr Bld: 6.9 % — ABNORMAL HIGH (ref 4.6–6.5)

## 2018-06-08 LAB — TSH: TSH: 4.4 u[IU]/mL (ref 0.35–4.50)

## 2018-06-08 MED ORDER — PENICILLIN G BENZATHINE 1200000 UNIT/2ML IM SUSP
600000.0000 [IU] | Freq: Once | INTRAMUSCULAR | Status: AC
  Administered 2018-06-08: 600000 [IU] via INTRAMUSCULAR

## 2018-06-08 NOTE — Progress Notes (Signed)
OFFICE VISIT  06/15/2018   CC:  Chief Complaint  Patient presents with  . Follow-up    [name], pt is not fasting.     HPI:    Patient is a 67 y.o. Caucasian male who presents for 3 mo f/u DM 2, HTN, chronic a-fib, CRI with GFR@60  ml/min, and recurrent/chronic cellulitis of lower legs.   The rollator is working out well, moving more, has lost some wt.  Feeling pretty well.  HTN: not checking bp at home although he does have a cuff now.   Says a bp at wound care center was low about 2 mo ago and he felt tired, so he cut back on metoprolol dose by 1/2 tab and he feels a lot better.  Chronic LE venous insufficiency edema, with overlying chronic cellulitis. He is frustrated about his lower legs, chronic wound care at wound care center, costs a lot of money. Has Una boots on chronically, goes twice a week for rechecks at wound clinic/rewrap compression. He is considering a trial of different wound clinic. It is time for his penicillin shot today, has f/u with ID next week.  A-fib: no palpitations or racing heart.  No signs of bleeding.  Takes xarelto as rx'd.  DM: home glucose monitoring.   Fastings: usually 160s.  2H PP 160s as well. He uses 75/25 insulin via sliding scale.  Ranges from 15-20 units per dose.  CRI: avoids NSAIDs, tries to hydrate adequately.  ROS: no CP, no SOB, no wheezing, no cough, no dizziness, no HAs, no rashes, no melena/hematochezia.  No polyuria or polydipsia.  No myalgias or arthralgias.   Past Medical History:  Diagnosis Date  . ALLERGIC RHINITIS 08/11/2006  . ASTHMA 08/11/2006  . Chronic atrial fibrillation (HCC) 08/2014  . Chronic combined systolic and diastolic CHF (congestive heart failure) [facility])    Cardiology f/u 12/2017: pt volume overloaded (R heart dysf suspected), BNP very high, lasix increased.  Repeat echo 12/2017: normal LV EF, mild DD, +RV syst dysfxn, mod pulm HTN, biatrial enlgmt.  . Chronic constipation   . Chronic renal insufficiency,  stage 2 (mild)    Borderline stage III (GFR 60s).  . DIABETES MELLITUS, TYPE II 08/11/2006  . DM W/RENAL MANIFESTATIONS, TYPE II 04/20/2007  . HYPERTENSION 08/11/2006  . Normocytic anemia 2016-2019   03/2018 B12 normal, iron ok (ferritin borderline low).  Plan repeat CBC at f/u summer 2019 and if decreased from baseline will check hemoccults and iron labs again.  . OBESITY, MORBID 12/14/2007  . 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  [id number] ID started him on amoxil prophylaxis---ineffective.  End 2018/Jan 2019 penicillin G injections prophyl helpful but pt declined to continue this as of 01/2018 ID f/u.  ID talked him into resuming monthly penicillin G as of 02/2018 f/u.  Marland Kitchen Restless leg syndrome    Rx'd clonazepam 09/2017 and pt refused to take it after reading the medication's potential side effects.  . Venous stasis ulcers of both lower extremities (HCC)    wound clinic care ongoing as of 01/2018    Past Surgical History:  Procedure Laterality Date  . TRANSTHORACIC ECHOCARDIOGRAM  11/2007; 09/2014; 11/2015;12/2017   LV fxn normal, EF normal, mild dilation of left atrium.  2015 grade II diast dysfxn.  2017 EF 55-60%. 2019: LVEF 60-65%, mild RV syst dysf,biatrial enlgmt, mod pulm htn.  Marland Kitchen URETERAL STENT PLACEMENT    . virtual colonoscopy  01/2011   Normal  Outpatient Medications Prior to Visit  Medication Sig Dispense Refill  . b complex vitamins capsule Take 1 capsule by mouth every morning.    . butalbital-acetaminophen-caffeine (FIORICET WITH CODEINE) 50-325-40-30 MG capsule TAKE 1 TO 2 CAPSULES BY  MOUTH EVERY 6 HOURS AS  NEEDED FOR HEADACHE(S)  MANUFACTURER RECOMMENDS NOT EXCEEDING 6 CAPSULES/DAY 90 capsule 1  . cetirizine (ZYRTEC) 10 MG tablet Take 10 mg by mouth daily.    . Cinnamon 500 MG TABS Take by mouth. Reported on 02/19/2016    . Coenzyme Q10 (CO Q 10) 10 MG CAPS Take by mouth. Reported on 02/19/2016    . diltiazem  (TIAZAC) 360 MG 24 hr capsule TAKE 1 CAPSULE BY MOUTH  DAILY AS DIRECTED 90 capsule 1  . Forskolin POWD by Does not apply route.    . furosemide (LASIX) 40 MG tablet TAKE 2 TABLETS BY MOUTH TWO TIMES DAILY 360 tablet 1  . glucose blood (ONE TOUCH ULTRA TEST) test strip CHECK BLOOD SUGAR TWO TIMES DAILY 100 each 11  . HUMALOG MIX 75/25 KWIKPEN (75-25) 100 UNIT/ML Kwikpen INJECT SUBCUTANEOUSLY 25  UNITS EVERY MORNING AND 20  UNITS EVERY EVENING AT  SUPPER 45 mL 1  . Insulin Pen Needle (BD ULTRA-FINE PEN NEEDLES) 29G X 12.7MM MISC 1 each by Other route 2 (two) times daily. 200 each 2  . Insulin Pen Needle (BD ULTRA-FINE PEN NEEDLES) 29G X 12.7MM MISC USE TWICE DAILY 200 each 3  . Insulin Pen Needle (RELION PEN NEEDLE 31G/8MM) 31G X 8 MM MISC 1 each by Does not apply route 2 (two) times daily.    Marland Kitchen. lisinopril (PRINIVIL,ZESTRIL) 40 MG tablet TAKE 1 TABLET BY MOUTH  EVERY DAY 90 tablet 1  . metFORMIN (GLUCOPHAGE) 1000 MG tablet TAKE 1 TABLET BY MOUTH  TWICE A DAY WITH MEALS 180 tablet 1  . metoprolol tartrate (LOPRESSOR) 50 MG tablet TAKE 1 AND 1/2 TABLETS BY  MOUTH TWO TIMES DAILY 270 tablet 1  . Multiple Vitamin (MULTIVITAMIN) tablet Take 1 tablet by mouth daily.    . Nutritional Supplements (GRAPESEED EXTRACT PO) Take 1 capsule by mouth daily.    . saw palmetto 500 MG capsule Take 500 mg by mouth daily.    Marland Kitchen. terazosin (HYTRIN) 10 MG capsule TAKE 1 CAPSULE BY MOUTH  ONCE A DAY AT BEDTIME 90 capsule 1  . vitamin E 400 UNIT capsule Take 400 Units by mouth every morning. Selenium 50mg     . XARELTO 20 MG TABS tablet Take 1 tablet by mouth daily.  1  . amoxicillin (AMOXIL) 500 MG capsule   0  . apixaban (ELIQUIS) 5 MG TABS tablet Take 1 tablet (5 mg total) by mouth 2 (two) times daily. (Patient not taking: Reported on 06/08/2018) 180 tablet 3   No facility-administered medications prior to visit.     No Known Allergies  ROS As per HPI  YQ:MVHQIONPE:Initial bp today 170/77, repeat manually at end of visit was  140/64. Blood pressure 140/64, pulse 65, temperature 97.9 F (36.6 C), temperature source Oral, resp. rate 16, height 5\' 8"  (1.727 m), weight (!) 373 lb 2 oz (169.2 kg), SpO2 93 %. Gen: Alert, well appearing.  Patient is oriented to person, place, time, and situation.  Gen: Alert, well appearing.  Patient is oriented to person, place, time, and situation. AFFECT: pleasant, lucid thought and speech. No further exam today.   LABS:  Lab Results  Component Value Date   TSH 4.40 06/08/2018   Lab Results  Component Value Date   WBC 6.1 06/08/2018   HGB 12.0 (L) 06/08/2018   HCT 36.2 (L) 06/08/2018   MCV 81.1 06/08/2018   PLT 222.0 06/08/2018   Lab Results  Component Value Date   CREATININE 1.18 06/08/2018   BUN 12 06/08/2018   NA 140 06/08/2018   K 4.4 06/08/2018   CL 102 06/08/2018   CO2 29 06/08/2018   Lab Results  Component Value Date   ALT 10 03/09/2018   AST 14 03/09/2018   ALKPHOS 65 03/09/2018   BILITOT 1.3 (H) 03/09/2018   Lab Results  Component Value Date   CHOL 84 03/09/2018   Lab Results  Component Value Date   HDL 34.00 (L) 03/09/2018   Lab Results  Component Value Date   LDLCALC 42 03/09/2018   Lab Results  Component Value Date   TRIG 41.0 03/09/2018   Lab Results  Component Value Date   CHOLHDL 2 03/09/2018   Lab Results  Component Value Date   PSA 0.26 03/09/2018   PSA 0.30 12/09/2016   PSA 0.21 12/11/2015   Lab Results  Component Value Date   HGBA1C 6.9 (H) 06/08/2018    IMPRESSION AND PLAN:  1) DM 2: control good. No changes made today. Hba1c and BMET today.  2)  HTN: stable, recent self decreased dose of lopressor to 1 and 1/2 of the 50mg  tabs bid. Monitor bp at home!  3) Chronic a-fib: rate controlled well with lopressor and diltiazem, anticoag with xarelto. CBC today.  4) CRI with GFR@60  ml/min: avoiding nsaids, staying hydrated. Lytes/cr today.   5) Recurrent/chronic cellulitis of lower legs: continue wound clinic,  although he is contemplating a 2nd opinion or transfer of care to t diff wound clinic.  Continue penicillin IM q month as per ID recommendations---dose given IM today. ID f/u soon.  6) Elevated TSH; very mild elevation 3 mo ago.  Check thyroid labs today.  An After Visit Summary was printed and given to the patient.  FOLLOW UP: Return in about 3 months (around 09/08/2018) for routine chronic illness f/u.  Signed:  Santiago Bumpers, MD           06/15/2018

## 2018-06-08 NOTE — Patient Instructions (Signed)
Check blood pressure and heart rate once a day and write numbers down and bring to your next follow up appointment for review with me. If you are consistently getting numbers of >150 on top or >90 on bottom, make appt for return PRIOR to next routine follow up visit.

## 2018-06-09 ENCOUNTER — Other Ambulatory Visit (HOSPITAL_COMMUNITY)
Admission: RE | Admit: 2018-06-09 | Discharge: 2018-06-09 | Disposition: A | Discharge status: Home / Self Care | Payer: Medicare Other | Source: Other Acute Inpatient Hospital | Attending: Physician Assistant | Admitting: Physician Assistant

## 2018-06-09 ENCOUNTER — Encounter: Payer: Self-pay | Admitting: *Deleted

## 2018-06-09 DIAGNOSIS — L97522 Non-pressure chronic ulcer of other part of left foot with fat layer exposed: Secondary | ICD-10-CM | POA: Diagnosis not present

## 2018-06-09 DIAGNOSIS — L97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 right calf limited to breakdown of skin: Secondary | ICD-10-CM | POA: Diagnosis not present

## 2018-06-09 DIAGNOSIS — Z794 Long term (current) use of insulin: Secondary | ICD-10-CM | POA: Diagnosis not present

## 2018-06-09 DIAGNOSIS — L97822 Non-pressure chronic ulcer of other part of left lower leg with fat layer exposed: Secondary | ICD-10-CM | POA: Diagnosis not present

## 2018-06-09 DIAGNOSIS — L97812 Non-pressure chronic ulcer of other part of right lower leg with fat layer exposed: Secondary | ICD-10-CM | POA: Diagnosis not present

## 2018-06-09 DIAGNOSIS — S81801A Unspecified open wound, right lower leg, initial encounter: Secondary | ICD-10-CM | POA: Diagnosis not present

## 2018-06-09 DIAGNOSIS — I87333 Chronic venous hypertension (idiopathic) 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: Secondary | ICD-10-CM | POA: Diagnosis not present

## 2018-06-09 DIAGNOSIS — E11622 Type 2 diabetes mellitus with other skin ulcer: Secondary | ICD-10-CM | POA: Diagnosis not present

## 2018-06-09 DIAGNOSIS — I890 Lymphedema, not elsewhere classified: Secondary | ICD-10-CM | POA: Diagnosis not present

## 2018-06-09 DIAGNOSIS — B9562 Methicillin resistant Staphylococcus aureus infection as the cause of diseases classified elsewhere: Secondary | ICD-10-CM | POA: Diagnosis not present

## 2018-06-09 DIAGNOSIS — I10 Essential (primary) hypertension: Secondary | ICD-10-CM | POA: Diagnosis not present

## 2018-06-09 DIAGNOSIS — E1140 Type 2 diabetes mellitus with diabetic neuropathy, unspecified: Secondary | ICD-10-CM | POA: Diagnosis not present

## 2018-06-09 DIAGNOSIS — E11621 Type 2 diabetes mellitus with foot ulcer: Secondary | ICD-10-CM | POA: Diagnosis not present

## 2018-06-09 LAB — T3: T3 TOTAL: 70 ng/dL — AB (ref 76–181)

## 2018-06-13 LAB — AEROBIC CULTURE W GRAM STAIN (SUPERFICIAL SPECIMEN)

## 2018-06-13 LAB — AEROBIC CULTURE  (SUPERFICIAL SPECIMEN)

## 2018-06-16 ENCOUNTER — Ambulatory Visit (INDEPENDENT_AMBULATORY_CARE_PROVIDER_SITE_OTHER): Payer: Medicare Other | Admitting: Infectious Diseases

## 2018-06-16 ENCOUNTER — Encounter: Payer: Self-pay | Admitting: Infectious Diseases

## 2018-06-16 DIAGNOSIS — L97822 Non-pressure chronic ulcer of other part of left lower leg with fat layer exposed: Secondary | ICD-10-CM | POA: Diagnosis not present

## 2018-06-16 DIAGNOSIS — Z794 Long term (current) use of insulin: Secondary | ICD-10-CM | POA: Diagnosis not present

## 2018-06-16 DIAGNOSIS — L03119 Cellulitis of unspecified part of limb: Secondary | ICD-10-CM | POA: Diagnosis not present

## 2018-06-16 DIAGNOSIS — E1140 Type 2 diabetes mellitus with diabetic neuropathy, unspecified: Secondary | ICD-10-CM | POA: Diagnosis not present

## 2018-06-16 DIAGNOSIS — I83013 Varicose veins of right lower extremity with ulcer of ankle: Secondary | ICD-10-CM

## 2018-06-16 DIAGNOSIS — L97312 Non-pressure chronic ulcer of right ankle with fat layer exposed: Secondary | ICD-10-CM | POA: Diagnosis not present

## 2018-06-16 DIAGNOSIS — L97812 Non-pressure chronic ulcer of other part of right lower leg with fat layer exposed: Secondary | ICD-10-CM | POA: Diagnosis not present

## 2018-06-16 DIAGNOSIS — L089 Local infection of the skin and subcutaneous tissue, unspecified: Secondary | ICD-10-CM | POA: Diagnosis not present

## 2018-06-16 DIAGNOSIS — I890 Lymphedema, not elsewhere classified: Secondary | ICD-10-CM | POA: Diagnosis not present

## 2018-06-16 DIAGNOSIS — I10 Essential (primary) hypertension: Secondary | ICD-10-CM | POA: Diagnosis not present

## 2018-06-16 DIAGNOSIS — L97522 Non-pressure chronic ulcer of other part of left foot with fat layer exposed: Secondary | ICD-10-CM | POA: Diagnosis not present

## 2018-06-16 DIAGNOSIS — E11621 Type 2 diabetes mellitus with foot ulcer: Secondary | ICD-10-CM | POA: Diagnosis not present

## 2018-06-16 DIAGNOSIS — S81801A Unspecified open wound, right lower leg, initial encounter: Secondary | ICD-10-CM | POA: Diagnosis not present

## 2018-06-16 DIAGNOSIS — E11622 Type 2 diabetes mellitus with other skin ulcer: Secondary | ICD-10-CM | POA: Diagnosis not present

## 2018-06-16 DIAGNOSIS — B9562 Methicillin resistant Staphylococcus aureus infection as the cause of diseases classified elsewhere: Secondary | ICD-10-CM | POA: Diagnosis not present

## 2018-06-16 DIAGNOSIS — I87333 Chronic venous hypertension (idiopathic) 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: Secondary | ICD-10-CM | POA: Diagnosis not present

## 2018-06-16 MED ORDER — LINEZOLID 600 MG PO TABS: 600.0000 mg | ORAL_TABLET | Freq: Two times a day (BID) | ORAL | 0 refills | Status: AC

## 2018-06-16 NOTE — Patient Instructions (Addendum)
Stop your ciprofloxacin. You have MRSA in this leg from wound culture 7 days ago and cipro will not work against this.   I sent in a medication for you called Linezolid - please if the cost is too much call our office at [phone] to discuss with our pharmacy team.   Alternatively we can consider setting you up with an infusion of a one time antibiotic at our infusion center.   Would continue bicillin shots for now so we can continue to see if it is helpful for you. Please come back in 4 months to check in again. If you can try to keep a log about antibiotic use - that may be helpful to remember how often you need to be treated.   Congratulations on the weight loss and you look like your diabetes is under good control. This will help in preventing these infections over time.

## 2018-06-16 NOTE — Assessment & Plan Note (Signed)
Left leg has demonstrated decreased infection frequency from what he tells me since re-initiating his bicillin injections. He would like to continue these. Discussed that they will provide little benefit for these purulent ulcerations as they are usually from other organisms (staph, GNRs). He understands. Appreciate coordination from his PCP team to give these shots to him. Will continue at Q4w interval for another 4 months and reassess. I asked him to keep a journal/log as to whenever he requires intermittent antibiotics and for which leg to help gather more information.

## 2018-06-16 NOTE — Assessment & Plan Note (Addendum)
Moderate amount of purulent drainage with slight odor. Wound bed is heavy with adherent yellow slough that is not easily removed. Still with some surrounding cellulitis and erythematous wound boarders. Mismatch from culture taken last week and current antibiotic. I will have him stop cipro and start linezolid x 7d to target the MRSA isolate. I would prefer to avoid bactrim if possible with him considering CKD and several agents that could cause hyperkalemia when used with this.  Follow up with wound care team today. I asked him to discuss my changes with them.  He will call our pharmacy team should copay be too much. Would consider infusion of dalbavancin x 1 if unable to do linezolid for the surrounding cellulitis 2/2 MRSA infection.

## 2018-06-16 NOTE — Progress Notes (Signed)
Name: [name]  DOB: 02/14/1951  MRN: [id number]  PCP: [name], MD  Referring Provider: Dr. Leanord Hawking    Patient Active Problem List   Diagnosis Date Noted  . Uncontrolled type 2 diabetes mellitus with hyperglycemia (HCC) 03/04/2018  . Venous stasis ulcer of ankle with fat layer exposed (HCC) 12/15/2017  . Chronic diastolic heart failure (HCC) 12/24/2016  . Peripheral edema 02/22/2016  . Wheezing 10/24/2015  . 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  . Chronic headaches 05/10/2015  . Venous stasis of both lower extremities 02/14/2015  . Equivocal stress test 10/24/2014  . Atrial fibrillation (HCC) 08/15/2014  . Healthcare maintenance 08/15/2014  . Type 2 diabetes mellitus with diabetic neuropathy (HCC) 08/15/2014  . 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 Recurrent cellulitis of lower extremity 08/02/2013  . Lower extremity edema 11/04/2008  . Morbid obesity due to excess calories (HCC) 12/14/2007  . Venous (peripheral) insufficiency 07/22/2007  . Diabetic renal disease (HCC) 04/20/2007  . DM2 (diabetes mellitus, type 2) (HCC) 08/11/2006  . Hypertension, essential 08/11/2006  . ALLERGIC RHINITIS 08/11/2006  . ASTHMA 08/11/2006   Subjective:  Brief Narrative:   [name] is a 67 y.o. male referred for evaluation of his recurrent cellulitis of his lower extremities. He has been followed closely by the [facility] since March 2017. He has a long standing history of morbid obesity, diabetes, B/L venous stasis with ulcerations, chronic lower extremity swelling / lymphadenopathy and weeping. Significant pmhx includes T2DM with insulin requirement, morbid obesity, diastolic CHF and significant swelling of his lower extremities / lymphedema. Last HgbA1C 6.9% in Feb 2019. ABI in April 2017 did not require intervention and LE venous dopplers 10/2016 without deep or superficial thrombus. He has been treated several times with combination of  Doxycycline, Amoxicillin and Augmentin. Previously we tried prophylactic Amoxicillin and Clindamycin without effect. He did have some improvement on Bicillin 1.2 million units IM Q4w however after 2 doses he did not return and decided to trial off therapy.   HPI: Mr. Alvillar is here today for 3 month follow up on recurrent cellulitis/LE wounds. Left leg has "nearly healed over" completely and has not had trouble with skin infections in this leg "in a while." Does have some ongoing weeping of the left #3 toe. Still doing wraps twice a week with the wound clinic. Last week was seen and had worsened drainage from an ulcer on the right lateral shin that started from a blister. Wound culture was obtained and placed on cipro. Not certain if it is improved or not. He was 2 weeks prior to that on keflex for "a while." Cannot recall exactly what he has been on. Has been taking the monthly bicillin shots for prophylaxis.   Review of Systems  Constitutional: Negative for chills, fever, malaise/fatigue and weight loss.  HENT: Negative for sore throat.   Respiratory: Negative for cough and sputum production.   Cardiovascular: Positive for leg swelling. Negative for chest pain, palpitations and claudication.  Gastrointestinal: Negative for abdominal pain, diarrhea and vomiting.  Genitourinary: Negative for dysuria and flank pain.  Musculoskeletal: Positive for joint pain.  Skin: Negative for rash.  Neurological: Negative for dizziness and headaches.    Past Medical History:  Diagnosis Date  . ALLERGIC RHINITIS 08/11/2006  . ASTHMA 08/11/2006  . Chronic atrial fibrillation (HCC) 08/2014  . Chronic combined systolic and diastolic CHF (congestive heart failure) [facility])    Cardiology f/u 12/2017: pt volume overloaded (R heart dysf suspected),  BNP very high, lasix increased.  Repeat echo 12/2017: normal LV EF, mild DD, +RV syst dysfxn, mod pulm HTN, biatrial enlgmt.  . Chronic constipation   . Chronic renal  insufficiency, stage 2 (mild)    Borderline stage III (GFR 60s).  . DIABETES MELLITUS, TYPE II 08/11/2006  . DM W/RENAL MANIFESTATIONS, TYPE II 04/20/2007  . HYPERTENSION 08/11/2006  . Normocytic anemia 2016-2019   03/2018 B12 normal, iron ok (ferritin borderline low).  Plan repeat CBC at f/u summer 2019 and if decreased from baseline will check hemoccults and iron labs again.  . OBESITY, MORBID 12/14/2007  . 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  92018 ID started him on amoxil prophylaxis---ineffective.  End 2018/Jan 2019 penicillin G injections prophyl helpful but pt declined to continue this as of 01/2018 ID f/u.  ID talked him into resuming monthly penicillin G as of 02/2018 f/u.  Marland Kitchen. Restless leg syndrome    Rx'd clonazepam 09/2017 and pt refused to take it after reading the medication's potential side effects.  . Venous stasis ulcers of both lower extremities (HCC)    wound clinic care ongoing as of 01/2018   Outpatient Medications Prior to Visit  Medication Sig Dispense Refill  . b complex vitamins capsule Take 1 capsule by mouth every morning.    . butalbital-acetaminophen-caffeine (FIORICET WITH CODEINE) 50-325-40-30 MG capsule TAKE 1 TO 2 CAPSULES BY  MOUTH EVERY 6 HOURS AS  NEEDED FOR HEADACHE(S)  MANUFACTURER RECOMMENDS NOT EXCEEDING 6 CAPSULES/DAY 90 capsule 1  . cetirizine (ZYRTEC) 10 MG tablet Take 10 mg by mouth daily.    . Cinnamon 500 MG TABS Take by mouth. Reported on 02/19/2016    . Coenzyme Q10 (CO Q 10) 10 MG CAPS Take by mouth. Reported on 02/19/2016    . diltiazem (TIAZAC) 360 MG 24 hr capsule TAKE 1 CAPSULE BY MOUTH  DAILY AS DIRECTED 90 capsule 1  . Forskolin POWD by Does not apply route.    . furosemide (LASIX) 40 MG tablet TAKE 2 TABLETS BY MOUTH TWO TIMES DAILY 360 tablet 1  . glucose blood (ONE TOUCH ULTRA TEST) test strip CHECK BLOOD SUGAR TWO TIMES DAILY 100 each 11  . HUMALOG MIX 75/25 KWIKPEN (75-25) 100  UNIT/ML Kwikpen INJECT SUBCUTANEOUSLY 25  UNITS EVERY MORNING AND 20  UNITS EVERY EVENING AT  SUPPER 45 mL 1  . Insulin Pen Needle (BD ULTRA-FINE PEN NEEDLES) 29G X 12.7MM MISC 1 each by Other route 2 (two) times daily. 200 each 2  . Insulin Pen Needle (BD ULTRA-FINE PEN NEEDLES) 29G X 12.7MM MISC USE TWICE DAILY 200 each 3  . Insulin Pen Needle (RELION PEN NEEDLE 31G/8MM) 31G X 8 MM MISC 1 each by Does not apply route 2 (two) times daily.    Marland Kitchen. lisinopril (PRINIVIL,ZESTRIL) 40 MG tablet TAKE 1 TABLET BY MOUTH  EVERY DAY 90 tablet 1  . metFORMIN (GLUCOPHAGE) 1000 MG tablet TAKE 1 TABLET BY MOUTH  TWICE A DAY WITH MEALS 180 tablet 1  . metoprolol tartrate (LOPRESSOR) 50 MG tablet TAKE 1 AND 1/2 TABLETS BY  MOUTH TWO TIMES DAILY 270 tablet 1  . Multiple Vitamin (MULTIVITAMIN) tablet Take 1 tablet by mouth daily.    . Nutritional Supplements (GRAPESEED EXTRACT PO) Take 1 capsule by mouth daily.    . saw palmetto 500 MG capsule Take 500 mg by mouth daily.    Marland Kitchen. terazosin (HYTRIN) 10 MG capsule TAKE 1 CAPSULE  BY MOUTH  ONCE A DAY AT BEDTIME 90 capsule 1  . vitamin E 400 UNIT capsule Take 400 Units by mouth every morning. Selenium 50mg     . XARELTO 20 MG TABS tablet Take 1 tablet by mouth daily.  1   No facility-administered medications prior to visit.    No Known Allergies  Physical Exam and Objective Findings: Vitals:   06/16/18 0947  BP: (!) 158/66  Pulse: 64  Weight: (!) 370 lb (167.8 kg)   Body mass index is 56.26 kg/m.  Physical Exam  Constitutional: He is oriented to person, place, and time.  Non-toxic appearance.  Seated in chair with rollator walker. Comfortable. Legs wrapped bilaterally.   Eyes: No scleral icterus.  Neck: Neck supple. No JVD present.  Cardiovascular: Normal rate, regular rhythm and intact distal pulses.  No murmur heard. Pulmonary/Chest: Effort normal and breath sounds normal. He has no wheezes. He has no rales.  Abdominal: Soft. He exhibits no distension. There  is no tenderness.  Musculoskeletal: He exhibits edema. He exhibits no tenderness.  Rollator walker for walking aid.  Bilateral legs wrapped with treated dressing. Politely declines to remove them as they were just applied.   Neurological: He is alert and oriented to person, place, and time.  Skin: Skin is warm and dry.  Psychiatric: Affect normal.  Vitals reviewed.  R lateral LE: Full thickness ulcer measuring 6.75 x 3 x 0.25 cm; 75% adherent yellow slough 25% pink/slick wound bed. Purulent drainage with slight odor noted upon removal. Periwound has some erythematous margins and swelling.      ASSESSMENT & PLAN:  Problem List Items Addressed This Visit      Musculoskeletal and Integument   Venous stasis ulcer of ankle with fat layer exposed (HCC)    Moderate amount of purulent drainage with slight odor. Wound bed is heavy with adherent yellow slough that is not easily removed. Still with some surrounding cellulitis and erythematous wound boarders. Mismatch from culture taken last week and current antibiotic. I will have him stop cipro and start linezolid x 7d to target the MRSA isolate. I would prefer to avoid bactrim if possible with him considering CKD and several agents that could cause hyperkalemia when used with this.  Follow up with wound care team today. I asked him to discuss my changes with them.  He will call our pharmacy team should copay be too much. Would consider infusion of dalbavancin x 1 if unable to do linezolid for the surrounding cellulitis 2/2 MRSA infection.         Other   Recurrent cellulitis of lower extremity    Left leg has demonstrated decreased infection frequency from what he tells me since re-initiating his bicillin injections. He would like to continue these. Discussed that they will provide little benefit for these purulent ulcerations as they are usually from other organisms (staph, GNRs). He understands. Appreciate coordination from his PCP team to give  these shots to him. Will continue at Q4w interval for another 4 months and reassess. I asked him to keep a journal/log as to whenever he requires intermittent antibiotics and for which leg to help gather more information.          Return in about 4 months (around 10/16/2018).  Rexene AlbertsStephanie Edel Rivero, MSN, [facility] Joseph Medical CenterFNP-C [facility]  06/16/2018 12:37 PM

## 2018-06-17 DIAGNOSIS — E113493 Type 2 diabetes mellitus with severe nonproliferative diabetic retinopathy without macular edema, bilateral: Secondary | ICD-10-CM | POA: Diagnosis not present

## 2018-06-19 DIAGNOSIS — I87333 Chronic venous hypertension (idiopathic) 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: Secondary | ICD-10-CM | POA: Diagnosis not present

## 2018-06-19 DIAGNOSIS — L97822 Non-pressure chronic ulcer of other part of left lower leg with fat layer exposed: Secondary | ICD-10-CM | POA: Diagnosis not present

## 2018-06-19 DIAGNOSIS — I10 Essential (primary) hypertension: Secondary | ICD-10-CM | POA: Diagnosis not present

## 2018-06-19 DIAGNOSIS — E11622 Type 2 diabetes mellitus with other skin ulcer: Secondary | ICD-10-CM | POA: Diagnosis not present

## 2018-06-19 DIAGNOSIS — I890 Lymphedema, not elsewhere classified: Secondary | ICD-10-CM | POA: Diagnosis not present

## 2018-06-19 DIAGNOSIS — B9562 Methicillin resistant Staphylococcus aureus infection as the cause of diseases classified elsewhere: Secondary | ICD-10-CM | POA: Diagnosis not present

## 2018-06-19 DIAGNOSIS — Z794 Long term (current) use of insulin: Secondary | ICD-10-CM | POA: Diagnosis not present

## 2018-06-19 DIAGNOSIS — E1140 Type 2 diabetes mellitus with diabetic neuropathy, unspecified: Secondary | ICD-10-CM | POA: Diagnosis not present

## 2018-06-19 DIAGNOSIS — L97812 Non-pressure chronic ulcer of other part of right lower leg with fat layer exposed: Secondary | ICD-10-CM | POA: Diagnosis not present

## 2018-06-23 DIAGNOSIS — I10 Essential (primary) hypertension: Secondary | ICD-10-CM | POA: Diagnosis not present

## 2018-06-23 DIAGNOSIS — S81801A Unspecified open wound, right lower leg, initial encounter: Secondary | ICD-10-CM | POA: Diagnosis not present

## 2018-06-23 DIAGNOSIS — E1140 Type 2 diabetes mellitus with diabetic neuropathy, unspecified: Secondary | ICD-10-CM | POA: Diagnosis not present

## 2018-06-23 DIAGNOSIS — L97822 Non-pressure chronic ulcer of other part of left lower leg with fat layer exposed: Secondary | ICD-10-CM | POA: Diagnosis not present

## 2018-06-23 DIAGNOSIS — L97812 Non-pressure chronic ulcer of other part of right lower leg with fat layer exposed: Secondary | ICD-10-CM | POA: Diagnosis not present

## 2018-06-23 DIAGNOSIS — E11622 Type 2 diabetes mellitus with other skin ulcer: Secondary | ICD-10-CM | POA: Diagnosis not present

## 2018-06-23 DIAGNOSIS — B9562 Methicillin resistant Staphylococcus aureus infection as the cause of diseases classified elsewhere: Secondary | ICD-10-CM | POA: Diagnosis not present

## 2018-06-23 DIAGNOSIS — Z794 Long term (current) use of insulin: Secondary | ICD-10-CM | POA: Diagnosis not present

## 2018-06-23 DIAGNOSIS — I87333 Chronic venous hypertension (idiopathic) 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: Secondary | ICD-10-CM | POA: Diagnosis not present

## 2018-06-23 DIAGNOSIS — I890 Lymphedema, not elsewhere classified: Secondary | ICD-10-CM | POA: Diagnosis not present

## 2018-06-24 ENCOUNTER — Encounter: Payer: Self-pay | Admitting: Family Medicine

## 2018-06-24 DIAGNOSIS — E113491 Type 2 diabetes mellitus with severe nonproliferative diabetic retinopathy without macular edema, right eye: Secondary | ICD-10-CM | POA: Diagnosis not present

## 2018-06-30 DIAGNOSIS — B9562 Methicillin resistant Staphylococcus aureus infection as the cause of diseases classified elsewhere: Secondary | ICD-10-CM | POA: Diagnosis not present

## 2018-06-30 DIAGNOSIS — I87333 Chronic venous hypertension (idiopathic) 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: Secondary | ICD-10-CM | POA: Diagnosis not present

## 2018-06-30 DIAGNOSIS — S81801A Unspecified open wound, right lower leg, initial encounter: Secondary | ICD-10-CM | POA: Diagnosis not present

## 2018-06-30 DIAGNOSIS — E11622 Type 2 diabetes mellitus with other skin ulcer: Secondary | ICD-10-CM | POA: Diagnosis not present

## 2018-06-30 DIAGNOSIS — S91105A Unspecified open wound of left lesser toe(s) without damage to nail, initial encounter: Secondary | ICD-10-CM | POA: Diagnosis not present

## 2018-06-30 DIAGNOSIS — I890 Lymphedema, not elsewhere classified: Secondary | ICD-10-CM | POA: Diagnosis not present

## 2018-06-30 DIAGNOSIS — E1140 Type 2 diabetes mellitus with diabetic neuropathy, unspecified: Secondary | ICD-10-CM | POA: Diagnosis not present

## 2018-06-30 DIAGNOSIS — L97822 Non-pressure chronic ulcer of other part of left lower leg with fat layer exposed: Secondary | ICD-10-CM | POA: Diagnosis not present

## 2018-06-30 DIAGNOSIS — L97812 Non-pressure chronic ulcer of other part of right lower leg with fat layer exposed: Secondary | ICD-10-CM | POA: Diagnosis not present

## 2018-06-30 DIAGNOSIS — I10 Essential (primary) hypertension: Secondary | ICD-10-CM | POA: Diagnosis not present

## 2018-06-30 DIAGNOSIS — Z794 Long term (current) use of insulin: Secondary | ICD-10-CM | POA: Diagnosis not present

## 2018-07-01 DIAGNOSIS — L97812 Non-pressure chronic ulcer of other part of right lower leg with fat layer exposed: Secondary | ICD-10-CM | POA: Diagnosis not present

## 2018-07-01 DIAGNOSIS — Z794 Long term (current) use of insulin: Secondary | ICD-10-CM | POA: Diagnosis not present

## 2018-07-01 DIAGNOSIS — E11622 Type 2 diabetes mellitus with other skin ulcer: Secondary | ICD-10-CM | POA: Diagnosis not present

## 2018-07-01 DIAGNOSIS — L97822 Non-pressure chronic ulcer of other part of left lower leg with fat layer exposed: Secondary | ICD-10-CM | POA: Diagnosis not present

## 2018-07-01 DIAGNOSIS — I890 Lymphedema, not elsewhere classified: Secondary | ICD-10-CM | POA: Diagnosis not present

## 2018-07-01 DIAGNOSIS — I10 Essential (primary) hypertension: Secondary | ICD-10-CM | POA: Diagnosis not present

## 2018-07-01 DIAGNOSIS — B9562 Methicillin resistant Staphylococcus aureus infection as the cause of diseases classified elsewhere: Secondary | ICD-10-CM | POA: Diagnosis not present

## 2018-07-01 DIAGNOSIS — I87333 Chronic venous hypertension (idiopathic) 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: Secondary | ICD-10-CM | POA: Diagnosis not present

## 2018-07-01 DIAGNOSIS — E1140 Type 2 diabetes mellitus with diabetic neuropathy, unspecified: Secondary | ICD-10-CM | POA: Diagnosis not present

## 2018-07-03 DIAGNOSIS — I10 Essential (primary) hypertension: Secondary | ICD-10-CM | POA: Diagnosis not present

## 2018-07-03 DIAGNOSIS — Z794 Long term (current) use of insulin: Secondary | ICD-10-CM | POA: Diagnosis not present

## 2018-07-03 DIAGNOSIS — I87333 Chronic venous hypertension (idiopathic) 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: Secondary | ICD-10-CM | POA: Diagnosis not present

## 2018-07-03 DIAGNOSIS — L97822 Non-pressure chronic ulcer of other part of left lower leg with fat layer exposed: Secondary | ICD-10-CM | POA: Diagnosis not present

## 2018-07-03 DIAGNOSIS — I890 Lymphedema, not elsewhere classified: Secondary | ICD-10-CM | POA: Diagnosis not present

## 2018-07-03 DIAGNOSIS — S81801A Unspecified open wound, right lower leg, initial encounter: Secondary | ICD-10-CM | POA: Diagnosis not present

## 2018-07-03 DIAGNOSIS — B9562 Methicillin resistant Staphylococcus aureus infection as the cause of diseases classified elsewhere: Secondary | ICD-10-CM | POA: Diagnosis not present

## 2018-07-03 DIAGNOSIS — E1140 Type 2 diabetes mellitus with diabetic neuropathy, unspecified: Secondary | ICD-10-CM | POA: Diagnosis not present

## 2018-07-03 DIAGNOSIS — L03115 Cellulitis of right lower limb: Secondary | ICD-10-CM | POA: Diagnosis not present

## 2018-07-03 DIAGNOSIS — L97812 Non-pressure chronic ulcer of other part of right lower leg with fat layer exposed: Secondary | ICD-10-CM | POA: Diagnosis not present

## 2018-07-03 DIAGNOSIS — E11622 Type 2 diabetes mellitus with other skin ulcer: Secondary | ICD-10-CM | POA: Diagnosis not present

## 2018-07-07 ENCOUNTER — Encounter (HOSPITAL_BASED_OUTPATIENT_CLINIC_OR_DEPARTMENT_OTHER): Payer: Medicare Other | Attending: Internal Medicine

## 2018-07-07 ENCOUNTER — Ambulatory Visit: Payer: Medicare Other

## 2018-07-07 ENCOUNTER — Other Ambulatory Visit (HOSPITAL_COMMUNITY)
Admission: RE | Admit: 2018-07-07 | Discharge: 2018-07-07 | Disposition: A | Discharge status: Home / Self Care | Payer: Medicare Other | Source: Other Acute Inpatient Hospital | Attending: Internal Medicine | Admitting: Internal Medicine

## 2018-07-07 DIAGNOSIS — L97822 Non-pressure chronic ulcer of other part of left lower leg with fat layer exposed: Secondary | ICD-10-CM | POA: Diagnosis not present

## 2018-07-07 DIAGNOSIS — L03115 Cellulitis of right lower limb: Secondary | ICD-10-CM | POA: Insufficient documentation

## 2018-07-07 DIAGNOSIS — E1151 Type 2 diabetes mellitus with diabetic peripheral angiopathy without gangrene: Secondary | ICD-10-CM | POA: Diagnosis not present

## 2018-07-07 DIAGNOSIS — B9562 Methicillin resistant Staphylococcus aureus infection as the cause of diseases classified elsewhere: Secondary | ICD-10-CM | POA: Insufficient documentation

## 2018-07-07 DIAGNOSIS — I10 Essential (primary) hypertension: Secondary | ICD-10-CM | POA: Diagnosis not present

## 2018-07-07 DIAGNOSIS — L97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 right calf limited to breakdown of skin: Secondary | ICD-10-CM | POA: Diagnosis not present

## 2018-07-07 DIAGNOSIS — L97229 Non-pressure chronic ulcer of left calf with unspecified severity: Secondary | ICD-10-CM | POA: Diagnosis not present

## 2018-07-07 DIAGNOSIS — E11622 Type 2 diabetes mellitus with other skin ulcer: Secondary | ICD-10-CM | POA: Insufficient documentation

## 2018-07-07 DIAGNOSIS — I87333 Chronic venous hypertension (idiopathic) 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: Secondary | ICD-10-CM | POA: Insufficient documentation

## 2018-07-07 DIAGNOSIS — I890 Lymphedema, not elsewhere classified: Secondary | ICD-10-CM | POA: Diagnosis not present

## 2018-07-07 DIAGNOSIS — L97222 Non-pressure chronic ulcer of left calf with fat layer exposed: Secondary | ICD-10-CM | POA: Diagnosis not present

## 2018-07-07 DIAGNOSIS — L97812 Non-pressure chronic ulcer of other part of right lower leg with fat layer exposed: Secondary | ICD-10-CM | POA: Insufficient documentation

## 2018-07-07 DIAGNOSIS — E1140 Type 2 diabetes mellitus with diabetic neuropathy, unspecified: Secondary | ICD-10-CM | POA: Insufficient documentation

## 2018-07-07 DIAGNOSIS — L97212 Non-pressure chronic ulcer of right calf with fat layer exposed: Secondary | ICD-10-CM | POA: Diagnosis not present

## 2018-07-08 ENCOUNTER — Encounter: Payer: Self-pay | Admitting: Family Medicine

## 2018-07-08 ENCOUNTER — Ambulatory Visit (INDEPENDENT_AMBULATORY_CARE_PROVIDER_SITE_OTHER): Payer: Medicare Other | Admitting: Family Medicine

## 2018-07-08 VITALS — BP 162/72 | HR 68 | Temp 97.9°F | Resp 16 | Ht 68.0 in | Wt 368.0 lb

## 2018-07-08 DIAGNOSIS — L0390 Cellulitis, unspecified: Secondary | ICD-10-CM

## 2018-07-08 DIAGNOSIS — H43392 Other vitreous opacities, left eye: Secondary | ICD-10-CM

## 2018-07-08 DIAGNOSIS — H5462 Unqualified visual loss, left eye, normal vision right eye: Secondary | ICD-10-CM

## 2018-07-08 MED ORDER — BUTALBITAL-APAP-CAFF-COD 50-325-40-30 MG PO CAPS: ORAL_CAPSULE | ORAL | 1 refills | Status: DC

## 2018-07-08 MED ORDER — PENICILLIN G BENZATHINE 1200000 UNIT/2ML IM SUSP
600000.0000 [IU] | Freq: Once | INTRAMUSCULAR | Status: AC
  Administered 2018-07-08: 600000 [IU] via INTRAMUSCULAR

## 2018-07-08 NOTE — Addendum Note (Signed)
Addended by: Regan Rakers on: 07/08/2018 01:45 PM   Modules accepted: Orders

## 2018-07-08 NOTE — Progress Notes (Signed)
OFFICE VISIT  07/08/2018   CC:  Chief Complaint  Patient presents with  . Follow-up     HPI:    Patient is a 67 y.o. Caucasian male who presents for 1 mo f/u HTN. BP up at last o/v, but he had decreased his lopressor to 1 of the 50mg  tabs bid prior to that visit b/c of feeling fatigued and having a low bp at different MD visit. He felt better on this dose of lopressor. He had not been checking bp at home.  Says bp's at wound care center 140/60s consistently.  Also, recent eye MD visit--([facility]: Dr. Robin Searing) for floaters in L>>R eye.  Pt was not told a dx, but was told to ask me to look further for carotid vasc dz.  I have no records available today regarding this problem.   Past Medical History:  Diagnosis Date  . ALLERGIC RHINITIS 08/11/2006  . ASTHMA 08/11/2006  . Chronic atrial fibrillation (HCC) 08/2014  . Chronic combined systolic and diastolic CHF (congestive heart failure) [facility])    Cardiology f/u 12/2017: pt volume overloaded (R heart dysf suspected), BNP very high, lasix increased.  Repeat echo 12/2017: normal LV EF, mild DD, +RV syst dysfxn, mod pulm HTN, biatrial enlgmt.  . Chronic constipation   . Chronic renal insufficiency, stage 2 (mild)    Borderline stage III (GFR 60s).  . DIABETES MELLITUS, TYPE II 08/11/2006  . DM W/RENAL MANIFESTATIONS, TYPE II 04/20/2007  . HYPERTENSION 08/11/2006  . MRSA infection 06/2018   LL venous stasis ulcer infected  . Normocytic anemia 2016-2019   03/2018 B12 normal, iron ok (ferritin borderline low).  Plan repeat CBC at f/u summer 2019 and if decreased from baseline will check hemoccults and iron labs again.  . OBESITY, MORBID 12/14/2007  . 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  [id number] ID started him on amoxil prophylaxis---ineffective.  End 2018/Jan 2019 penicillin G injections prophyl helpful but pt declined to continue this as of 01/2018 ID f/u.  ID talked  him into resuming monthly penicillin G as of 02/2018 f/u.  Marland Kitchen Restless leg syndrome    Rx'd clonazepam 09/2017 and pt refused to take it after reading the medication's potential side effects.  . Venous stasis ulcers of both lower extremities (HCC)    wound clinic care ongoing as of 01/2018    Past Surgical History:  Procedure Laterality Date  . TRANSTHORACIC ECHOCARDIOGRAM  11/2007; 09/2014; 11/2015;12/2017   LV fxn normal, EF normal, mild dilation of left atrium.  2015 grade II diast dysfxn.  2017 EF 55-60%. 2019: LVEF 60-65%, mild RV syst dysf,biatrial enlgmt, mod pulm htn.  Marland Kitchen URETERAL STENT PLACEMENT    . virtual colonoscopy  01/2011   Normal    Outpatient Medications Prior to Visit  Medication Sig Dispense Refill  . b complex vitamins capsule Take 1 capsule by mouth every morning.    . butalbital-acetaminophen-caffeine (FIORICET WITH CODEINE) 50-325-40-30 MG capsule TAKE 1 TO 2 CAPSULES BY  MOUTH EVERY 6 HOURS AS  NEEDED FOR HEADACHE(S)  MANUFACTURER RECOMMENDS NOT EXCEEDING 6 CAPSULES/DAY 90 capsule 1  . cetirizine (ZYRTEC) 10 MG tablet Take 10 mg by mouth daily.    . Cinnamon 500 MG TABS Take by mouth. Reported on 02/19/2016    . Coenzyme Q10 (CO Q 10) 10 MG CAPS Take by mouth. Reported on 02/19/2016    . diltiazem (TIAZAC) 360 MG 24 hr capsule TAKE 1 CAPSULE  BY MOUTH  DAILY AS DIRECTED 90 capsule 1  . Forskolin POWD by Does not apply route.    . furosemide (LASIX) 40 MG tablet TAKE 2 TABLETS BY MOUTH TWO TIMES DAILY 360 tablet 1  . glucose blood (ONE TOUCH ULTRA TEST) test strip CHECK BLOOD SUGAR TWO TIMES DAILY 100 each 11  . HUMALOG MIX 75/25 KWIKPEN (75-25) 100 UNIT/ML Kwikpen INJECT SUBCUTANEOUSLY 25  UNITS EVERY MORNING AND 20  UNITS EVERY EVENING AT  SUPPER 45 mL 1  . Insulin Pen Needle (BD ULTRA-FINE PEN NEEDLES) 29G X 12.7MM MISC 1 each by Other route 2 (two) times daily. 200 each 2  . Insulin Pen Needle (BD ULTRA-FINE PEN NEEDLES) 29G X 12.7MM MISC USE TWICE DAILY 200 each 3  .  Insulin Pen Needle (RELION PEN NEEDLE 31G/8MM) 31G X 8 MM MISC 1 each by Does not apply route 2 (two) times daily.    Marland Kitchen lisinopril (PRINIVIL,ZESTRIL) 40 MG tablet TAKE 1 TABLET BY MOUTH  EVERY DAY 90 tablet 1  . metFORMIN (GLUCOPHAGE) 1000 MG tablet TAKE 1 TABLET BY MOUTH  TWICE A DAY WITH MEALS 180 tablet 1  . metoprolol tartrate (LOPRESSOR) 50 MG tablet TAKE 1 AND 1/2 TABLETS BY  MOUTH TWO TIMES DAILY 270 tablet 1  . Multiple Vitamin (MULTIVITAMIN) tablet Take 1 tablet by mouth daily.    . Nutritional Supplements (GRAPESEED EXTRACT PO) Take 1 capsule by mouth daily.    . saw palmetto 500 MG capsule Take 500 mg by mouth daily.    Marland Kitchen terazosin (HYTRIN) 10 MG capsule TAKE 1 CAPSULE BY MOUTH  ONCE A DAY AT BEDTIME 90 capsule 1  . vitamin E 400 UNIT capsule Take 400 Units by mouth every morning. Selenium 50mg     . XARELTO 20 MG TABS tablet Take 1 tablet by mouth daily.  1   No facility-administered medications prior to visit.     No Known Allergies  ROS As per HPI  PE: Blood pressure (!) 162/72, pulse 68, temperature 97.9 F (36.6 C), temperature source Oral, resp. rate 16, height 5\' 8"  (1.727 m), weight (!) 368 lb (166.9 kg), SpO2 96 %. Gen: Alert, well appearing.  Patient is oriented to person, place, time, and situation. AFFECT: pleasant, lucid thought and speech. CV: irreg irreg, no murmur or rub. Chest is clear, no wheezing or rales. Normal symmetric air entry throughout both lung fields. No chest wall deformities or tenderness. EXT: both LL's have UNA boot wrappings applied.  LABS:    Chemistry      Component Value Date/Time   NA 140 06/08/2018 1012   NA 142 01/14/2018 1147   K 4.4 06/08/2018 1012   CL 102 06/08/2018 1012   CO2 29 06/08/2018 1012   BUN 12 06/08/2018 1012   BUN 18 01/14/2018 1147   CREATININE 1.18 06/08/2018 1012   CREATININE 1.02 02/28/2016 1037      Component Value Date/Time   CALCIUM 8.9 06/08/2018 1012   ALKPHOS 65 03/09/2018 1005   AST 14 03/09/2018  1005   ALT 10 03/09/2018 1005   BILITOT 1.3 (H) 03/09/2018 1005     Lab Results  Component Value Date   HGBA1C 6.9 (H) 06/08/2018    IMPRESSION AND PLAN:  1) Retinal vascular problem: will need to get ophthalmologist records for exact dx, but will order Carotid dopplers today.  2) HTN: control is pretty good per pt's report of bp checks at his wound clinic visits, which he gets twice per week.  He does not monitor bp at home. No med changes today.  An After Visit Summary was printed and given to the patient.  FOLLOW UP: Return for Keep f/u visit already scheduled for 09/08/18.  Signed:  Santiago Bumpers, MD           07/08/2018

## 2018-07-10 DIAGNOSIS — I890 Lymphedema, not elsewhere classified: Secondary | ICD-10-CM | POA: Diagnosis not present

## 2018-07-10 DIAGNOSIS — L97822 Non-pressure chronic ulcer of other part of left lower leg with fat layer exposed: Secondary | ICD-10-CM | POA: Diagnosis not present

## 2018-07-10 DIAGNOSIS — L97812 Non-pressure chronic ulcer of other part of right lower leg with fat layer exposed: Secondary | ICD-10-CM | POA: Diagnosis not present

## 2018-07-10 DIAGNOSIS — B9562 Methicillin resistant Staphylococcus aureus infection as the cause of diseases classified elsewhere: Secondary | ICD-10-CM | POA: Diagnosis not present

## 2018-07-10 DIAGNOSIS — I87333 Chronic venous hypertension (idiopathic) 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: Secondary | ICD-10-CM | POA: Diagnosis not present

## 2018-07-10 DIAGNOSIS — L97222 Non-pressure chronic ulcer of left calf with fat layer exposed: Secondary | ICD-10-CM | POA: Diagnosis not present

## 2018-07-10 DIAGNOSIS — E11622 Type 2 diabetes mellitus with other skin ulcer: Secondary | ICD-10-CM | POA: Diagnosis not present

## 2018-07-10 DIAGNOSIS — I10 Essential (primary) hypertension: Secondary | ICD-10-CM | POA: Diagnosis not present

## 2018-07-10 DIAGNOSIS — E1140 Type 2 diabetes mellitus with diabetic neuropathy, unspecified: Secondary | ICD-10-CM | POA: Diagnosis not present

## 2018-07-10 DIAGNOSIS — L03115 Cellulitis of right lower limb: Secondary | ICD-10-CM | POA: Diagnosis not present

## 2018-07-10 DIAGNOSIS — E1151 Type 2 diabetes mellitus with diabetic peripheral angiopathy without gangrene: Secondary | ICD-10-CM | POA: Diagnosis not present

## 2018-07-10 DIAGNOSIS — L97229 Non-pressure chronic ulcer of left calf with unspecified severity: Secondary | ICD-10-CM | POA: Diagnosis not present

## 2018-07-11 LAB — AEROBIC CULTURE  (SUPERFICIAL SPECIMEN)

## 2018-07-11 LAB — AEROBIC CULTURE W GRAM STAIN (SUPERFICIAL SPECIMEN)

## 2018-07-14 DIAGNOSIS — E1151 Type 2 diabetes mellitus with diabetic peripheral angiopathy without gangrene: Secondary | ICD-10-CM | POA: Diagnosis not present

## 2018-07-14 DIAGNOSIS — L97222 Non-pressure chronic ulcer of left calf with fat layer exposed: Secondary | ICD-10-CM | POA: Diagnosis not present

## 2018-07-14 DIAGNOSIS — L97822 Non-pressure chronic ulcer of other part of left lower leg with fat layer exposed: Secondary | ICD-10-CM | POA: Diagnosis not present

## 2018-07-14 DIAGNOSIS — I10 Essential (primary) hypertension: Secondary | ICD-10-CM | POA: Diagnosis not present

## 2018-07-14 DIAGNOSIS — I87333 Chronic venous hypertension (idiopathic) 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: Secondary | ICD-10-CM | POA: Diagnosis not present

## 2018-07-14 DIAGNOSIS — E1140 Type 2 diabetes mellitus with diabetic neuropathy, unspecified: Secondary | ICD-10-CM | POA: Diagnosis not present

## 2018-07-14 DIAGNOSIS — E11622 Type 2 diabetes mellitus with other skin ulcer: Secondary | ICD-10-CM | POA: Diagnosis not present

## 2018-07-14 DIAGNOSIS — I890 Lymphedema, not elsewhere classified: Secondary | ICD-10-CM | POA: Diagnosis not present

## 2018-07-14 DIAGNOSIS — L97812 Non-pressure chronic ulcer of other part of right lower leg with fat layer exposed: Secondary | ICD-10-CM | POA: Diagnosis not present

## 2018-07-14 DIAGNOSIS — L03115 Cellulitis of right lower limb: Secondary | ICD-10-CM | POA: Diagnosis not present

## 2018-07-14 DIAGNOSIS — B9562 Methicillin resistant Staphylococcus aureus infection as the cause of diseases classified elsewhere: Secondary | ICD-10-CM | POA: Diagnosis not present

## 2018-07-14 DIAGNOSIS — L97229 Non-pressure chronic ulcer of left calf with unspecified severity: Secondary | ICD-10-CM | POA: Diagnosis not present

## 2018-07-20 DIAGNOSIS — I10 Essential (primary) hypertension: Secondary | ICD-10-CM | POA: Diagnosis not present

## 2018-07-20 DIAGNOSIS — L03115 Cellulitis of right lower limb: Secondary | ICD-10-CM | POA: Diagnosis not present

## 2018-07-20 DIAGNOSIS — E11622 Type 2 diabetes mellitus with other skin ulcer: Secondary | ICD-10-CM | POA: Diagnosis not present

## 2018-07-20 DIAGNOSIS — E1151 Type 2 diabetes mellitus with diabetic peripheral angiopathy without gangrene: Secondary | ICD-10-CM | POA: Diagnosis not present

## 2018-07-20 DIAGNOSIS — I87313 Chronic venous hypertension (idiopathic) with ulcer of bilateral lower extremity: Secondary | ICD-10-CM | POA: Diagnosis not present

## 2018-07-20 DIAGNOSIS — E1140 Type 2 diabetes mellitus with diabetic neuropathy, unspecified: Secondary | ICD-10-CM | POA: Diagnosis not present

## 2018-07-20 DIAGNOSIS — L97229 Non-pressure chronic ulcer of left calf with unspecified severity: Secondary | ICD-10-CM | POA: Diagnosis not present

## 2018-07-20 DIAGNOSIS — L97812 Non-pressure chronic ulcer of other part of right lower leg with fat layer exposed: Secondary | ICD-10-CM | POA: Diagnosis not present

## 2018-07-20 DIAGNOSIS — L97219 Non-pressure chronic ulcer of right calf with unspecified severity: Secondary | ICD-10-CM | POA: Diagnosis not present

## 2018-07-20 DIAGNOSIS — B9562 Methicillin resistant Staphylococcus aureus infection as the cause of diseases classified elsewhere: Secondary | ICD-10-CM | POA: Diagnosis not present

## 2018-07-20 DIAGNOSIS — L97222 Non-pressure chronic ulcer of left calf with fat layer exposed: Secondary | ICD-10-CM | POA: Diagnosis not present

## 2018-07-20 DIAGNOSIS — L97822 Non-pressure chronic ulcer of other part of left lower leg with fat layer exposed: Secondary | ICD-10-CM | POA: Diagnosis not present

## 2018-07-20 DIAGNOSIS — I890 Lymphedema, not elsewhere classified: Secondary | ICD-10-CM | POA: Diagnosis not present

## 2018-07-20 DIAGNOSIS — L97212 Non-pressure chronic ulcer of right calf with fat layer exposed: Secondary | ICD-10-CM | POA: Diagnosis not present

## 2018-07-20 DIAGNOSIS — I87333 Chronic venous hypertension (idiopathic) 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: Secondary | ICD-10-CM | POA: Diagnosis not present

## 2018-07-23 ENCOUNTER — Ambulatory Visit (HOSPITAL_BASED_OUTPATIENT_CLINIC_OR_DEPARTMENT_OTHER)
Admission: RE | Admit: 2018-07-23 | Discharge: 2018-07-23 | Disposition: A | Discharge status: Home / Self Care | Payer: Medicare Other | Source: Ambulatory Visit | Attending: Family Medicine | Admitting: Family Medicine

## 2018-07-23 DIAGNOSIS — I10 Essential (primary) hypertension: Secondary | ICD-10-CM | POA: Insufficient documentation

## 2018-07-23 DIAGNOSIS — E119 Type 2 diabetes mellitus without complications: Secondary | ICD-10-CM | POA: Diagnosis not present

## 2018-07-23 DIAGNOSIS — H5462 Unqualified visual loss, left eye, normal vision right eye: Secondary | ICD-10-CM | POA: Diagnosis not present

## 2018-07-23 DIAGNOSIS — H43392 Other vitreous opacities, left eye: Secondary | ICD-10-CM | POA: Diagnosis not present

## 2018-07-23 DIAGNOSIS — I6521 Occlusion and stenosis of right carotid artery: Secondary | ICD-10-CM | POA: Diagnosis not present

## 2018-07-23 HISTORY — PX: OTHER SURGICAL HISTORY: SHX169

## 2018-07-23 NOTE — Progress Notes (Addendum)
CATOTID DOPPLER DUPLEX PERFORMED BILATERALLY    Right Carotid:Velocities in the right ICA are consistent with a 1-39% stenosis. .      Vertebrals: Bilateral vertebral arteries demonstrate antegrade flow.    Subclavians: Normal flow hemodynamics were seen in bilateral subclavian arteries.     07/23/18  Sinda DuJoseph Elim Peale RDCS, RVT

## 2018-07-24 ENCOUNTER — Encounter: Payer: Self-pay | Admitting: *Deleted

## 2018-07-24 ENCOUNTER — Encounter: Payer: Self-pay | Admitting: Family Medicine

## 2018-07-27 DIAGNOSIS — E1151 Type 2 diabetes mellitus with diabetic peripheral angiopathy without gangrene: Secondary | ICD-10-CM | POA: Diagnosis not present

## 2018-07-27 DIAGNOSIS — S81801A Unspecified open wound, right lower leg, initial encounter: Secondary | ICD-10-CM | POA: Diagnosis not present

## 2018-07-27 DIAGNOSIS — I87312 Chronic venous hypertension (idiopathic) with ulcer of left lower extremity: Secondary | ICD-10-CM | POA: Diagnosis not present

## 2018-07-27 DIAGNOSIS — I890 Lymphedema, not elsewhere classified: Secondary | ICD-10-CM | POA: Diagnosis not present

## 2018-07-27 DIAGNOSIS — L97229 Non-pressure chronic ulcer of left calf with unspecified severity: Secondary | ICD-10-CM | POA: Diagnosis not present

## 2018-07-27 DIAGNOSIS — E1140 Type 2 diabetes mellitus with diabetic neuropathy, unspecified: Secondary | ICD-10-CM | POA: Diagnosis not present

## 2018-07-27 DIAGNOSIS — L97822 Non-pressure chronic ulcer of other part of left lower leg with fat layer exposed: Secondary | ICD-10-CM | POA: Diagnosis not present

## 2018-07-27 DIAGNOSIS — E11622 Type 2 diabetes mellitus with other skin ulcer: Secondary | ICD-10-CM | POA: Diagnosis not present

## 2018-07-27 DIAGNOSIS — L03115 Cellulitis of right lower limb: Secondary | ICD-10-CM | POA: Diagnosis not present

## 2018-07-27 DIAGNOSIS — L97812 Non-pressure chronic ulcer of other part of right lower leg with fat layer exposed: Secondary | ICD-10-CM | POA: Diagnosis not present

## 2018-07-27 DIAGNOSIS — S81802A Unspecified open wound, left lower leg, initial encounter: Secondary | ICD-10-CM | POA: Diagnosis not present

## 2018-07-27 DIAGNOSIS — B9562 Methicillin resistant Staphylococcus aureus infection as the cause of diseases classified elsewhere: Secondary | ICD-10-CM | POA: Diagnosis not present

## 2018-07-27 DIAGNOSIS — I87333 Chronic venous hypertension (idiopathic) 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: Secondary | ICD-10-CM | POA: Diagnosis not present

## 2018-07-27 DIAGNOSIS — I10 Essential (primary) hypertension: Secondary | ICD-10-CM | POA: Diagnosis not present

## 2018-07-27 DIAGNOSIS — L97222 Non-pressure chronic ulcer of left calf with fat layer exposed: Secondary | ICD-10-CM | POA: Diagnosis not present

## 2018-08-05 ENCOUNTER — Encounter (HOSPITAL_BASED_OUTPATIENT_CLINIC_OR_DEPARTMENT_OTHER): Payer: Medicare Other | Attending: Internal Medicine

## 2018-08-05 ENCOUNTER — Encounter: Payer: Self-pay | Admitting: Family Medicine

## 2018-08-05 ENCOUNTER — Other Ambulatory Visit: Payer: Self-pay

## 2018-08-05 ENCOUNTER — Ambulatory Visit (INDEPENDENT_AMBULATORY_CARE_PROVIDER_SITE_OTHER): Payer: Medicare Other | Admitting: Family Medicine

## 2018-08-05 VITALS — BP 110/60 | HR 60 | Temp 97.5°F | Resp 18 | Wt 383.0 lb

## 2018-08-05 DIAGNOSIS — I87312 Chronic venous hypertension (idiopathic) with ulcer of left lower extremity: Secondary | ICD-10-CM | POA: Diagnosis not present

## 2018-08-05 DIAGNOSIS — I87333 Chronic venous hypertension (idiopathic) 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: Secondary | ICD-10-CM | POA: Diagnosis not present

## 2018-08-05 DIAGNOSIS — I890 Lymphedema, not elsewhere classified: Secondary | ICD-10-CM | POA: Insufficient documentation

## 2018-08-05 DIAGNOSIS — L97822 Non-pressure chronic ulcer of other part of left lower leg with fat layer exposed: Secondary | ICD-10-CM | POA: Diagnosis not present

## 2018-08-05 DIAGNOSIS — J069 Acute upper respiratory infection, unspecified: Secondary | ICD-10-CM

## 2018-08-05 DIAGNOSIS — J209 Acute bronchitis, unspecified: Secondary | ICD-10-CM

## 2018-08-05 DIAGNOSIS — L97222 Non-pressure chronic ulcer of left calf with fat layer exposed: Secondary | ICD-10-CM | POA: Insufficient documentation

## 2018-08-05 DIAGNOSIS — E11622 Type 2 diabetes mellitus with other skin ulcer: Secondary | ICD-10-CM | POA: Insufficient documentation

## 2018-08-05 DIAGNOSIS — I10 Essential (primary) hypertension: Secondary | ICD-10-CM | POA: Diagnosis not present

## 2018-08-05 DIAGNOSIS — L97812 Non-pressure chronic ulcer of other part of right lower leg with fat layer exposed: Secondary | ICD-10-CM | POA: Insufficient documentation

## 2018-08-05 DIAGNOSIS — E1140 Type 2 diabetes mellitus with diabetic neuropathy, unspecified: Secondary | ICD-10-CM | POA: Insufficient documentation

## 2018-08-05 DIAGNOSIS — L97522 Non-pressure chronic ulcer of other part of left foot with fat layer exposed: Secondary | ICD-10-CM | POA: Insufficient documentation

## 2018-08-05 DIAGNOSIS — E1151 Type 2 diabetes mellitus with diabetic peripheral angiopathy without gangrene: Secondary | ICD-10-CM | POA: Diagnosis not present

## 2018-08-05 MED ORDER — HYDROCODONE-ACETAMINOPHEN 5-325 MG PO TABS: ORAL_TABLET | ORAL | 0 refills | Status: DC

## 2018-08-05 MED ORDER — PREDNISONE 20 MG PO TABS: ORAL_TABLET | ORAL | 0 refills | Status: DC

## 2018-08-05 MED ORDER — AZITHROMYCIN 250 MG PO TABS: ORAL_TABLET | ORAL | 0 refills | Status: DC

## 2018-08-05 NOTE — Patient Instructions (Signed)
Get otc generic robitussin DM OR Mucinex DM and use as directed on the packaging for cough and congestion. Use otc generic saline nasal spray 2-3 times per day to irrigate/moisturize your nasal passages.   

## 2018-08-05 NOTE — Progress Notes (Signed)
OFFICE VISIT  08/05/2018   CC:  Chief Complaint  Patient presents with  . URI    x 5 days ago.nasal congestion, cough-productive, sob. Tried Aspirin   HPI:    Patient is a 67 y.o. Caucasian male with chronic combined systolic and diastolic HF as well as chronic a-fib who presents for "cough and congestion".  Onset 5 d/a starting with nasal congestion, tattly cough, hard to sleep supine.  No fever. Feels much more SOB with exertion since onset of cough.  Feels full of fluid more than usual.  During traveling he did take less diuretic (approx 50% of usual).  Legs suprisingly don't feel much more swollen than usual.  No CP.  No wheezing. He feels a little better last 24.  He has been taking ASA daily to help with this, says it also helps his knee pains when he has to get up in the night to urinate.   [place].  Sister with similar illness prior to him.  Dx bronchitis.  ROS: no n/v/d or rash.  Chronic LBP and chronic B knee pains.  Past Medical History:  Diagnosis Date  . ALLERGIC RHINITIS 08/11/2006  . ASTHMA 08/11/2006  . Chronic atrial fibrillation 08/2014  . Chronic combined systolic and diastolic CHF (congestive heart failure) [facility])    Cardiology f/u 12/2017: pt volume overloaded (R heart dysf suspected), BNP very high, lasix increased.  Repeat echo 12/2017: normal LV EF, mild DD, +RV syst dysfxn, mod pulm HTN, biatrial enlgmt.  . Chronic constipation   . Chronic renal insufficiency, stage 2 (mild)    Borderline stage III (GFR 60s).  . DIABETES MELLITUS, TYPE II 08/11/2006  . DM W/RENAL MANIFESTATIONS, TYPE II 04/20/2007  . HYPERTENSION 08/11/2006  . MRSA infection 06/2018   LL venous stasis ulcer infected  . Normocytic anemia 2016-2019   03/2018 B12 normal, iron ok (ferritin borderline low).  Plan repeat CBC at f/u summer 2019 and if decreased from baseline will check hemoccults and iron labs again.  . OBESITY, MORBID 12/14/2007  . 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  [id number] ID started him on amoxil prophylaxis---ineffective.  End 2018/Jan 2019 penicillin G injections prophyl helpful but pt declined to continue this as of 01/2018 ID f/u.  ID talked him into resuming monthly penicillin G as of 02/2018 f/u.  Marland Kitchen Restless leg syndrome    Rx'd clonazepam 09/2017 and pt refused to take it after reading the medication's potential side effects.  . Venous stasis ulcers of both lower extremities (HCC)    wound clinic care ongoing as of 01/2018    Past Surgical History:  Procedure Laterality Date  . Carotid dopplers  07/23/2018   Left NORMAL.  Right 1-39% ICA stenosis, with <50% distal CCA stenosis (not hemodynamically significant)  . TRANSTHORACIC ECHOCARDIOGRAM  11/2007; 09/2014; 11/2015;12/2017   LV fxn normal, EF normal, mild dilation of left atrium.  2015 grade II diast dysfxn.  2017 EF 55-60%. 2019: LVEF 60-65%, mild RV syst dysf,biatrial enlgmt, mod pulm htn.  Marland Kitchen URETERAL STENT PLACEMENT    . virtual colonoscopy  01/2011   Normal    Outpatient Medications Prior to Visit  Medication Sig Dispense Refill  . b complex vitamins capsule Take 1 capsule by mouth every morning.    . butalbital-acetaminophen-caffeine (FIORICET WITH CODEINE) 50-325-40-30 MG capsule TAKE 1 TO 2 CAPSULES BY  MOUTH EVERY 6 HOURS AS  NEEDED FOR HEADACHE(S)  MANUFACTURER RECOMMENDS NOT EXCEEDING 6 CAPSULES/DAY 60 capsule 1  . cetirizine (ZYRTEC) 10 MG tablet Take 10 mg by mouth daily.    . Cinnamon 500 MG TABS Take by mouth. Reported on 02/19/2016    . Coenzyme Q10 (CO Q 10) 10 MG CAPS Take by mouth. Reported on 02/19/2016    . diltiazem (TIAZAC) 360 MG 24 hr capsule TAKE 1 CAPSULE BY MOUTH  DAILY AS DIRECTED 90 capsule 1  . Forskolin POWD by Does not apply route.    . furosemide (LASIX) 40 MG tablet TAKE 2 TABLETS BY MOUTH TWO TIMES DAILY 360 tablet 1  . glucose blood (ONE TOUCH ULTRA TEST) test strip CHECK BLOOD SUGAR TWO  TIMES DAILY 100 each 11  . HUMALOG MIX 75/25 KWIKPEN (75-25) 100 UNIT/ML Kwikpen INJECT SUBCUTANEOUSLY 25  UNITS EVERY MORNING AND 20  UNITS EVERY EVENING AT  SUPPER 45 mL 1  . Insulin Pen Needle (BD ULTRA-FINE PEN NEEDLES) 29G X 12.7MM MISC 1 each by Other route 2 (two) times daily. 200 each 2  . Insulin Pen Needle (BD ULTRA-FINE PEN NEEDLES) 29G X 12.7MM MISC USE TWICE DAILY 200 each 3  . Insulin Pen Needle (RELION PEN NEEDLE 31G/8MM) 31G X 8 MM MISC 1 each by Does not apply route 2 (two) times daily.    Marland Kitchen lisinopril (PRINIVIL,ZESTRIL) 40 MG tablet TAKE 1 TABLET BY MOUTH  EVERY DAY 90 tablet 1  . metFORMIN (GLUCOPHAGE) 1000 MG tablet TAKE 1 TABLET BY MOUTH  TWICE A DAY WITH MEALS 180 tablet 1  . metoprolol tartrate (LOPRESSOR) 50 MG tablet TAKE 1 AND 1/2 TABLETS BY  MOUTH TWO TIMES DAILY 270 tablet 1  . Multiple Vitamin (MULTIVITAMIN) tablet Take 1 tablet by mouth daily.    . Nutritional Supplements (GRAPESEED EXTRACT PO) Take 1 capsule by mouth daily.    . saw palmetto 500 MG capsule Take 500 mg by mouth daily.    Marland Kitchen terazosin (HYTRIN) 10 MG capsule TAKE 1 CAPSULE BY MOUTH  ONCE A DAY AT BEDTIME 90 capsule 1  . vitamin E 400 UNIT capsule Take 400 Units by mouth every morning. Selenium 50mg     . XARELTO 20 MG TABS tablet Take 1 tablet by mouth daily.  1   No facility-administered medications prior to visit.     No Known Allergies  ROS As per HPI  PE: Blood pressure 110/60, pulse 60, temperature (!) 97.5 F (36.4 C), temperature source Oral, resp. rate 18, weight (!) 383 lb (173.7 kg), SpO2 95 %. VS: noted--normal. Gen: alert, NAD, NONTOXIC APPEARING. HEENT: eyes without injection, drainage, or swelling.  Ears: EACs clear, TMs with normal light reflex and landmarks.  Nose: Clear rhinorrhea, with some dried, crusty exudate adherent to mildly injected mucosa.  No purulent d/c.  No paranasal sinus TTP.  No facial swelling.  Throat and mouth without focal lesion.  No pharyngial swelling,  erythema, or exudate.   Neck: supple, no LAD.   LUNGS: CTA bilat on inspiration, trace diffuse exp wheeze, aeration is fine, exp phase not prolonged. Nonlabored resps.   CV: RRR, no m/r/g. EXT: no c/c.  Marked bilat LL edema, both are currently wrapped in UNA boots. SKIN: no rash    LABS:    Chemistry      Component Value Date/Time   NA 140 06/08/2018 1012   NA 142 01/14/2018 1147   K 4.4 06/08/2018 1012   CL 102 06/08/2018 1012   CO2 29 06/08/2018 1012   BUN 12  06/08/2018 1012   BUN 18 01/14/2018 1147   CREATININE 1.18 06/08/2018 1012   CREATININE 1.02 02/28/2016 1037      Component Value Date/Time   CALCIUM 8.9 06/08/2018 1012   ALKPHOS 65 03/09/2018 1005   AST 14 03/09/2018 1005   ALT 10 03/09/2018 1005   BILITOT 1.3 (H) 03/09/2018 1005     Lab Results  Component Value Date   WBC 6.1 06/08/2018   HGB 12.0 (L) 06/08/2018   HCT 36.2 (L) 06/08/2018   MCV 81.1 06/08/2018   PLT 222.0 06/08/2018   Lab Results  Component Value Date   HGBA1C 6.9 (H) 06/08/2018    IMPRESSION AND PLAN:  1) URI with acute bronchitis: he is at high risk for bacterial pulm infection. Zpack rx'd. Prednisone 40mg  qd x 5d, then 20mg  qd x 5d. Get otc generic robitussin DM OR Mucinex DM and use as directed on the packaging for cough and congestion. Use otc generic saline nasal spray 2-3 times per day to irrigate/moisturize your nasal passages.  2) Chronic LB and knee pains: osteoarthritis complicated by his morbid obesity. STOP ASA !  Warned pt of risk of bleeding taking this med when he is already taking xarelto daily. Pt expressed understanding. Tylenol does nothing for him. We discussed trial of rx pain med: I rx'd vicodin 5/325, 1-2 bid prn, #42 today.  An After Visit Summary was printed and given to the patient.  FOLLOW UP: Return if symptoms worsen or fail to improve.  Signed:  Santiago Bumpers, MD           08/05/2018

## 2018-08-10 ENCOUNTER — Ambulatory Visit (INDEPENDENT_AMBULATORY_CARE_PROVIDER_SITE_OTHER): Payer: Medicare Other | Admitting: Family Medicine

## 2018-08-10 DIAGNOSIS — L0390 Cellulitis, unspecified: Secondary | ICD-10-CM

## 2018-08-10 MED ORDER — PENICILLIN G BENZATHINE 1200000 UNIT/2ML IM SUSP
600000.0000 [IU] | INTRAMUSCULAR | Status: DC
  Administered 2018-08-10 – 2021-04-04 (×8): 600000 [IU] via INTRAMUSCULAR

## 2018-08-10 MED ORDER — PENICILLIN G BENZATHINE 1200000 UNIT/2ML IM SUSP
600000.0000 [IU] | INTRAMUSCULAR | Status: DC
  Administered 2018-08-10 – 2020-04-12 (×7): 600000 [IU] via INTRAMUSCULAR

## 2018-08-10 NOTE — Progress Notes (Signed)
Patient presents today for monthly Bicillin LA 600,000 units per 1 ml x 2. Patient tolerated injections well.   Patient has an office visit with Dr Milinda Cave scheduled 09/10/18.

## 2018-08-13 ENCOUNTER — Other Ambulatory Visit: Payer: Self-pay | Admitting: Family Medicine

## 2018-08-13 DIAGNOSIS — E11622 Type 2 diabetes mellitus with other skin ulcer: Secondary | ICD-10-CM | POA: Diagnosis not present

## 2018-08-13 DIAGNOSIS — I890 Lymphedema, not elsewhere classified: Secondary | ICD-10-CM | POA: Diagnosis not present

## 2018-08-13 DIAGNOSIS — I87333 Chronic venous hypertension (idiopathic) 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: Secondary | ICD-10-CM | POA: Diagnosis not present

## 2018-08-13 DIAGNOSIS — I10 Essential (primary) hypertension: Secondary | ICD-10-CM | POA: Diagnosis not present

## 2018-08-13 DIAGNOSIS — L97222 Non-pressure chronic ulcer of left calf with fat layer exposed: Secondary | ICD-10-CM | POA: Diagnosis not present

## 2018-08-13 DIAGNOSIS — S81801A Unspecified open wound, right lower leg, initial encounter: Secondary | ICD-10-CM | POA: Diagnosis not present

## 2018-08-13 DIAGNOSIS — L97522 Non-pressure chronic ulcer of other part of left foot with fat layer exposed: Secondary | ICD-10-CM | POA: Diagnosis not present

## 2018-08-13 DIAGNOSIS — L97822 Non-pressure chronic ulcer of other part of left lower leg with fat layer exposed: Secondary | ICD-10-CM | POA: Diagnosis not present

## 2018-08-13 DIAGNOSIS — E1140 Type 2 diabetes mellitus with diabetic neuropathy, unspecified: Secondary | ICD-10-CM | POA: Diagnosis not present

## 2018-08-13 DIAGNOSIS — S91105A Unspecified open wound of left lesser toe(s) without damage to nail, initial encounter: Secondary | ICD-10-CM | POA: Diagnosis not present

## 2018-08-13 DIAGNOSIS — E1151 Type 2 diabetes mellitus with diabetic peripheral angiopathy without gangrene: Secondary | ICD-10-CM | POA: Diagnosis not present

## 2018-08-13 DIAGNOSIS — L97812 Non-pressure chronic ulcer of other part of right lower leg with fat layer exposed: Secondary | ICD-10-CM | POA: Diagnosis not present

## 2018-08-20 DIAGNOSIS — I10 Essential (primary) hypertension: Secondary | ICD-10-CM | POA: Diagnosis not present

## 2018-08-20 DIAGNOSIS — L97222 Non-pressure chronic ulcer of left calf with fat layer exposed: Secondary | ICD-10-CM | POA: Diagnosis not present

## 2018-08-20 DIAGNOSIS — I890 Lymphedema, not elsewhere classified: Secondary | ICD-10-CM | POA: Diagnosis not present

## 2018-08-20 DIAGNOSIS — E1151 Type 2 diabetes mellitus with diabetic peripheral angiopathy without gangrene: Secondary | ICD-10-CM | POA: Diagnosis not present

## 2018-08-20 DIAGNOSIS — E11622 Type 2 diabetes mellitus with other skin ulcer: Secondary | ICD-10-CM | POA: Diagnosis not present

## 2018-08-20 DIAGNOSIS — I87333 Chronic venous hypertension (idiopathic) 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: Secondary | ICD-10-CM | POA: Diagnosis not present

## 2018-08-20 DIAGNOSIS — I872 Venous insufficiency (chronic) (peripheral): Secondary | ICD-10-CM | POA: Diagnosis not present

## 2018-08-20 DIAGNOSIS — S81801A Unspecified open wound, right lower leg, initial encounter: Secondary | ICD-10-CM | POA: Diagnosis not present

## 2018-08-20 DIAGNOSIS — L97812 Non-pressure chronic ulcer of other part of right lower leg with fat layer exposed: Secondary | ICD-10-CM | POA: Diagnosis not present

## 2018-08-20 DIAGNOSIS — L97822 Non-pressure chronic ulcer of other part of left lower leg with fat layer exposed: Secondary | ICD-10-CM | POA: Diagnosis not present

## 2018-08-20 DIAGNOSIS — E1140 Type 2 diabetes mellitus with diabetic neuropathy, unspecified: Secondary | ICD-10-CM | POA: Diagnosis not present

## 2018-08-20 DIAGNOSIS — L97522 Non-pressure chronic ulcer of other part of left foot with fat layer exposed: Secondary | ICD-10-CM | POA: Diagnosis not present

## 2018-08-20 DIAGNOSIS — S91105A Unspecified open wound of left lesser toe(s) without damage to nail, initial encounter: Secondary | ICD-10-CM | POA: Diagnosis not present

## 2018-08-23 NOTE — Progress Notes (Signed)
Noted. Pt on monthly penicillin injections for chronic bilat LL cellulitis. Agree with injections to day in clinic. Signed:  Santiago Bumpers, MD           08/23/2018

## 2018-08-28 DIAGNOSIS — L03116 Cellulitis of left lower limb: Secondary | ICD-10-CM | POA: Diagnosis not present

## 2018-08-28 DIAGNOSIS — I87333 Chronic venous hypertension (idiopathic) 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: Secondary | ICD-10-CM | POA: Diagnosis not present

## 2018-08-28 DIAGNOSIS — I890 Lymphedema, not elsewhere classified: Secondary | ICD-10-CM | POA: Diagnosis not present

## 2018-08-28 DIAGNOSIS — E1140 Type 2 diabetes mellitus with diabetic neuropathy, unspecified: Secondary | ICD-10-CM | POA: Diagnosis not present

## 2018-08-28 DIAGNOSIS — L97322 Non-pressure chronic ulcer of left ankle with fat layer exposed: Secondary | ICD-10-CM | POA: Diagnosis not present

## 2018-08-28 DIAGNOSIS — L97222 Non-pressure chronic ulcer of left calf with fat layer exposed: Secondary | ICD-10-CM | POA: Diagnosis not present

## 2018-08-28 DIAGNOSIS — L97822 Non-pressure chronic ulcer of other part of left lower leg with fat layer exposed: Secondary | ICD-10-CM | POA: Diagnosis not present

## 2018-08-28 DIAGNOSIS — E1151 Type 2 diabetes mellitus with diabetic peripheral angiopathy without gangrene: Secondary | ICD-10-CM | POA: Diagnosis not present

## 2018-08-28 DIAGNOSIS — L97522 Non-pressure chronic ulcer of other part of left foot with fat layer exposed: Secondary | ICD-10-CM | POA: Diagnosis not present

## 2018-08-28 DIAGNOSIS — E11622 Type 2 diabetes mellitus with other skin ulcer: Secondary | ICD-10-CM | POA: Diagnosis not present

## 2018-08-28 DIAGNOSIS — I10 Essential (primary) hypertension: Secondary | ICD-10-CM | POA: Diagnosis not present

## 2018-08-28 DIAGNOSIS — L97812 Non-pressure chronic ulcer of other part of right lower leg with fat layer exposed: Secondary | ICD-10-CM | POA: Diagnosis not present

## 2018-09-01 DIAGNOSIS — I890 Lymphedema, not elsewhere classified: Secondary | ICD-10-CM | POA: Diagnosis not present

## 2018-09-01 DIAGNOSIS — Z79899 Other long term (current) drug therapy: Secondary | ICD-10-CM | POA: Diagnosis not present

## 2018-09-01 DIAGNOSIS — Z794 Long term (current) use of insulin: Secondary | ICD-10-CM | POA: Diagnosis not present

## 2018-09-01 DIAGNOSIS — I4891 Unspecified atrial fibrillation: Secondary | ICD-10-CM | POA: Diagnosis not present

## 2018-09-01 DIAGNOSIS — I10 Essential (primary) hypertension: Secondary | ICD-10-CM | POA: Diagnosis not present

## 2018-09-01 DIAGNOSIS — E119 Type 2 diabetes mellitus without complications: Secondary | ICD-10-CM | POA: Diagnosis not present

## 2018-09-01 DIAGNOSIS — Z7901 Long term (current) use of anticoagulants: Secondary | ICD-10-CM | POA: Diagnosis not present

## 2018-09-04 DIAGNOSIS — Z794 Long term (current) use of insulin: Secondary | ICD-10-CM | POA: Diagnosis not present

## 2018-09-04 DIAGNOSIS — L97521 Non-pressure chronic ulcer of other 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 of skin: Secondary | ICD-10-CM | POA: Diagnosis not present

## 2018-09-04 DIAGNOSIS — E11621 Type 2 diabetes mellitus with foot ulcer: Secondary | ICD-10-CM | POA: Diagnosis not present

## 2018-09-04 DIAGNOSIS — I4891 Unspecified atrial fibrillation: Secondary | ICD-10-CM | POA: Diagnosis not present

## 2018-09-04 DIAGNOSIS — Z79899 Other long term (current) drug therapy: Secondary | ICD-10-CM | POA: Diagnosis not present

## 2018-09-04 DIAGNOSIS — Z7901 Long term (current) use of anticoagulants: Secondary | ICD-10-CM | POA: Diagnosis not present

## 2018-09-04 DIAGNOSIS — I890 Lymphedema, not elsewhere classified: Secondary | ICD-10-CM | POA: Diagnosis not present

## 2018-09-04 DIAGNOSIS — E119 Type 2 diabetes mellitus without complications: Secondary | ICD-10-CM | POA: Diagnosis not present

## 2018-09-04 DIAGNOSIS — S81802A Unspecified open wound, left lower leg, initial encounter: Secondary | ICD-10-CM | POA: Diagnosis not present

## 2018-09-04 DIAGNOSIS — I10 Essential (primary) hypertension: Secondary | ICD-10-CM | POA: Diagnosis not present

## 2018-09-07 ENCOUNTER — Encounter (HOSPITAL_BASED_OUTPATIENT_CLINIC_OR_DEPARTMENT_OTHER): Payer: Medicare Other | Attending: Internal Medicine

## 2018-09-07 DIAGNOSIS — E11621 Type 2 diabetes mellitus with foot ulcer: Secondary | ICD-10-CM | POA: Insufficient documentation

## 2018-09-07 DIAGNOSIS — I890 Lymphedema, not elsewhere classified: Secondary | ICD-10-CM | POA: Diagnosis not present

## 2018-09-07 DIAGNOSIS — L97229 Non-pressure chronic ulcer of left calf with unspecified severity: Secondary | ICD-10-CM | POA: Diagnosis not present

## 2018-09-07 DIAGNOSIS — I10 Essential (primary) hypertension: Secondary | ICD-10-CM | POA: Insufficient documentation

## 2018-09-07 DIAGNOSIS — L97522 Non-pressure chronic ulcer of other part of left foot with fat layer exposed: Secondary | ICD-10-CM | POA: Insufficient documentation

## 2018-09-07 DIAGNOSIS — L97819 Non-pressure chronic ulcer of other part of right lower leg with unspecified severity: Secondary | ICD-10-CM | POA: Insufficient documentation

## 2018-09-07 DIAGNOSIS — L97222 Non-pressure chronic ulcer of left calf with fat layer exposed: Secondary | ICD-10-CM | POA: Diagnosis not present

## 2018-09-07 DIAGNOSIS — Z794 Long term (current) use of insulin: Secondary | ICD-10-CM | POA: Insufficient documentation

## 2018-09-07 DIAGNOSIS — L97822 Non-pressure chronic ulcer of other part of left lower leg with fat layer exposed: Secondary | ICD-10-CM | POA: Insufficient documentation

## 2018-09-07 DIAGNOSIS — E11622 Type 2 diabetes mellitus with other skin ulcer: Secondary | ICD-10-CM | POA: Insufficient documentation

## 2018-09-07 DIAGNOSIS — I87333 Chronic venous hypertension (idiopathic) 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: Secondary | ICD-10-CM | POA: Diagnosis not present

## 2018-09-07 DIAGNOSIS — E1140 Type 2 diabetes mellitus with diabetic neuropathy, unspecified: Secondary | ICD-10-CM | POA: Diagnosis not present

## 2018-09-07 DIAGNOSIS — L03116 Cellulitis of left lower limb: Secondary | ICD-10-CM | POA: Insufficient documentation

## 2018-09-08 ENCOUNTER — Encounter: Payer: Self-pay | Admitting: Family Medicine

## 2018-09-08 ENCOUNTER — Ambulatory Visit (INDEPENDENT_AMBULATORY_CARE_PROVIDER_SITE_OTHER): Payer: Medicare Other | Admitting: Family Medicine

## 2018-09-08 VITALS — BP 134/72 | HR 70 | Temp 97.4°F | Resp 16 | Ht 68.0 in | Wt 388.0 lb

## 2018-09-08 DIAGNOSIS — N182 Chronic kidney disease, stage 2 (mild): Secondary | ICD-10-CM | POA: Diagnosis not present

## 2018-09-08 DIAGNOSIS — Z23 Encounter for immunization: Secondary | ICD-10-CM | POA: Diagnosis not present

## 2018-09-08 DIAGNOSIS — I482 Chronic atrial fibrillation, unspecified: Secondary | ICD-10-CM

## 2018-09-08 DIAGNOSIS — I10 Essential (primary) hypertension: Secondary | ICD-10-CM | POA: Diagnosis not present

## 2018-09-08 DIAGNOSIS — E118 Type 2 diabetes mellitus with unspecified complications: Secondary | ICD-10-CM | POA: Diagnosis not present

## 2018-09-08 DIAGNOSIS — N2889 Other specified disorders of kidney and ureter: Secondary | ICD-10-CM

## 2018-09-08 DIAGNOSIS — L03119 Cellulitis of unspecified part of limb: Secondary | ICD-10-CM | POA: Diagnosis not present

## 2018-09-08 LAB — BASIC METABOLIC PANEL
BUN: 21 mg/dL (ref 6–23)
CALCIUM: 8.7 mg/dL (ref 8.4–10.5)
CHLORIDE: 102 meq/L (ref 96–112)
CO2: 24 mEq/L (ref 19–32)
Creatinine, Ser: 1.34 mg/dL (ref 0.40–1.50)
GFR: 56.44 mL/min — ABNORMAL LOW (ref >60.00)
Glucose, Bld: 118 mg/dL — ABNORMAL HIGH (ref 70–99)
Potassium: 4.4 mEq/L (ref 3.5–5.1)
Sodium: 136 mEq/L (ref 135–145)

## 2018-09-08 LAB — HEMOGLOBIN A1C: HEMOGLOBIN A1C: 6.8 % — AB (ref 4.6–6.5)

## 2018-09-08 MED ORDER — PENICILLIN G BENZATHINE 1200000 UNIT/2ML IM SUSP
600000.0000 [IU] | Freq: Once | INTRAMUSCULAR | Status: AC
  Administered 2018-09-08: 600000 [IU] via INTRAMUSCULAR

## 2018-09-08 NOTE — Progress Notes (Signed)
OFFICE VISIT  09/08/2018   CC:  Chief Complaint  Patient presents with  . Follow-up    [name]    HPI:    Patient is a 67 y.o. Caucasian male who presents for 3 mo f/u DM 2, HTN, chronic a-fib, CRI with GFR@60  ml/min, and recurrent/chronic cellulitis of lower legs.  Chronic cellulitis bilat LL's: his wound care MD started doxycycline 09/04/18--L leg cellulitis was getting worse-->pt states he is responding to this very well.  He gets monthly pen G injections here for same problem--per ID MD recommendations. Has chronic combined syst and diast HF, is currently on 80mg  lasix bid for his chronic severe LE pitting edema.  DM: fasting gluc 146.  Typically between 140 and 170.  Evenings 180-212 at supper.  HTN: no home monitoring.  No mention of high bp readings when he goes to wound clinic.  A-fib: no palpitations or heart racing.  No melena, hematochezia, hematuria, excessive bruising, or nosebleeds.  CRI: trying to hydrate well.  Avoid NSAIDs.  Past Medical History:  Diagnosis Date  . ALLERGIC RHINITIS 08/11/2006  . ASTHMA 08/11/2006  . Chronic atrial fibrillation 08/2014  . Chronic combined systolic and diastolic CHF (congestive heart failure) [facility])    Cardiology f/u 12/2017: pt volume overloaded (R heart dysf suspected), BNP very high, lasix increased.  Repeat echo 12/2017: normal LV EF, mild DD, +RV syst dysfxn, mod pulm HTN, biatrial enlgmt.  . Chronic constipation   . Chronic renal insufficiency, stage 2 (mild)    Borderline stage III (GFR 60s).  . DIABETES MELLITUS, TYPE II 08/11/2006  . DM W/RENAL MANIFESTATIONS, TYPE II 04/20/2007  . HYPERTENSION 08/11/2006  . MRSA infection 06/2018   LL venous stasis ulcer infected  . Normocytic anemia 2016-2019   03/2018 B12 normal, iron ok (ferritin borderline low).  Plan repeat CBC at f/u summer 2019 and if decreased from baseline will check hemoccults and iron labs again.  . OBESITY, MORBID 12/14/2007  . Recurrent cellulitis of lower leg  2017-18   06/2017 Clindamycin suppression (hx of MRSA) caused diarrhea.  92018 ID started him on amoxil prophylaxis---ineffective.  End 2018/Jan 2019 penicillin G injections prophyl helpful but pt declined to continue this as of 01/2018 ID f/u.  ID talked him into resuming monthly penicillin G as of 02/2018 f/u.  Marland Kitchen Restless leg syndrome    Rx'd clonazepam 09/2017 and pt refused to take it after reading the medication's potential side effects.  . Venous stasis ulcers of both lower extremities (HCC)    wound clinic care ongoing as of 01/2018    Past Surgical History:  Procedure Laterality Date  . Carotid dopplers  07/23/2018   Left NORMAL.  Right 1-39% ICA stenosis, with <50% distal CCA stenosis (not hemodynamically significant)  . TRANSTHORACIC ECHOCARDIOGRAM  11/2007; 09/2014; 11/2015;12/2017   LV fxn normal, EF normal, mild dilation of left atrium.  2015 grade II diast dysfxn.  2017 EF 55-60%. 2019: LVEF 60-65%, mild RV syst dysf,biatrial enlgmt, mod pulm htn.  Marland Kitchen URETERAL STENT PLACEMENT    . virtual colonoscopy  01/2011   Normal    Outpatient Medications Prior to Visit  Medication Sig Dispense Refill  . b complex vitamins capsule Take 1 capsule by mouth every morning.    . butalbital-acetaminophen-caffeine (FIORICET WITH CODEINE) 50-325-40-30 MG capsule TAKE 1 TO 2 CAPSULES BY  MOUTH EVERY 6 HOURS AS  NEEDED FOR HEADACHE(S)  MANUFACTURER RECOMMENDS NOT EXCEEDING 6 CAPSULES/DAY 60 capsule 1  . cetirizine (ZYRTEC) 10 MG tablet Take  10 mg by mouth daily.    . Cinnamon 500 MG TABS Take by mouth. Reported on 02/19/2016    . Coenzyme Q10 (CO Q 10) 10 MG CAPS Take by mouth. Reported on 02/19/2016    . diltiazem (TIAZAC) 360 MG 24 hr capsule TAKE 1 CAPSULE BY MOUTH  DAILY AS DIRECTED 90 capsule 1  . Forskolin POWD by Does not apply route.    . furosemide (LASIX) 40 MG tablet TAKE 2 TABLETS BY MOUTH TWO TIMES DAILY 360 tablet 1  . glucose blood (ONE TOUCH ULTRA TEST) test strip CHECK BLOOD SUGAR TWO  TIMES DAILY 100 each 11  . HUMALOG MIX 75/25 KWIKPEN (75-25) 100 UNIT/ML Kwikpen INJECT SUBCUTANEOUSLY 25  UNITS EVERY MORNING AND 20  UNITS EVERY EVENING AT  SUPPER 45 mL 1  . HYDROcodone-acetaminophen (NORCO/VICODIN) 5-325 MG tablet 1-2 tabs po bid prn pain 42 tablet 0  . Insulin Pen Needle (BD ULTRA-FINE PEN NEEDLES) 29G X 12.7MM MISC 1 each by Other route 2 (two) times daily. 200 each 2  . Insulin Pen Needle (BD ULTRA-FINE PEN NEEDLES) 29G X 12.7MM MISC USE TWICE DAILY 200 each 3  . Insulin Pen Needle (RELION PEN NEEDLE 31G/8MM) 31G X 8 MM MISC 1 each by Does not apply route 2 (two) times daily.    Marland Kitchen lisinopril (PRINIVIL,ZESTRIL) 40 MG tablet TAKE 1 TABLET BY MOUTH  EVERY DAY 90 tablet 1  . metFORMIN (GLUCOPHAGE) 1000 MG tablet TAKE 1 TABLET BY MOUTH  TWICE A DAY WITH MEALS 180 tablet 1  . metoprolol tartrate (LOPRESSOR) 50 MG tablet TAKE 1 AND 1/2 TABLETS BY  MOUTH TWO TIMES DAILY 270 tablet 1  . Multiple Vitamin (MULTIVITAMIN) tablet Take 1 tablet by mouth daily.    . Nutritional Supplements (GRAPESEED EXTRACT PO) Take 1 capsule by mouth daily.    . saw palmetto 500 MG capsule Take 500 mg by mouth daily.    Marland Kitchen terazosin (HYTRIN) 10 MG capsule TAKE 1 CAPSULE BY MOUTH  ONCE A DAY AT BEDTIME 90 capsule 1  . terazosin (HYTRIN) 10 MG capsule TAKE 1 CAPSULE BY MOUTH  ONCE A DAY AT BEDTIME 90 capsule 1  . vitamin E 400 UNIT capsule Take 400 Units by mouth every morning. Selenium 50mg     . XARELTO 20 MG TABS tablet Take 1 tablet by mouth daily.  1  . azithromycin (ZITHROMAX) 250 MG tablet 2 tabs po qd x 1d, then 1 tab po qd x 4d (Patient not taking: Reported on 09/08/2018) 6 tablet 0  . predniSONE (DELTASONE) 20 MG tablet 2 tabs po qd x 5d, then 1 tab po qd x 5d (Patient not taking: Reported on 09/08/2018) 15 tablet 0   Facility-Administered Medications Prior to Visit  Medication Dose Route Frequency Provider Last Rate Last Dose  . penicillin g benzathine (BICILLIN LA) 1200000 UNIT/2ML injection  600,000 Units  600,000 Units Intramuscular Q30 days [name], MD   600,000 Units at 08/10/18 1136  . penicillin g benzathine (BICILLIN LA) 1200000 UNIT/2ML injection 600,000 Units  600,000 Units Intramuscular Q30 days [name], MD   600,000 Units at 08/10/18 1135    No Known Allergies  ROS As per HPI  PE: Blood pressure 134/72, pulse 70, temperature (!) 97.4 F (36.3 C), temperature source Oral, resp. rate 16, height 5\' 8"  (1.727 m), weight (!) 388 lb (176 kg), SpO2 93 %. Body mass index is 59 kg/m.  Gen: Alert, well appearing.  Patient is oriented to person,  place, time, and situation. AFFECT: pleasant, lucid thought and speech. CV: pretty regular, tough to tell if in a-fib today or not.  Rate 70 or so.  No murmur appreciated.  No rub/gallop. Chest is clear, no wheezing or rales.  Mildly prolonged exp phase. EXT: he has UNA boot dressings on both LL's from knees down.  LABS:  Lab Results  Component Value Date   TSH 4.40 06/08/2018   Lab Results  Component Value Date   WBC 6.1 06/08/2018   HGB 12.0 (L) 06/08/2018   HCT 36.2 (L) 06/08/2018   MCV 81.1 06/08/2018   PLT 222.0 06/08/2018   Lab Results  Component Value Date   IRON 45 03/10/2018   FERRITIN 26.4 03/10/2018    Lab Results  Component Value Date   CREATININE 1.18 06/08/2018   BUN 12 06/08/2018   NA 140 06/08/2018   K 4.4 06/08/2018   CL 102 06/08/2018   CO2 29 06/08/2018   Lab Results  Component Value Date   ALT 10 03/09/2018   AST 14 03/09/2018   ALKPHOS 65 03/09/2018   BILITOT 1.3 (H) 03/09/2018   Lab Results  Component Value Date   CHOL 84 03/09/2018   Lab Results  Component Value Date   HDL 34.00 (L) 03/09/2018   Lab Results  Component Value Date   LDLCALC 42 03/09/2018   Lab Results  Component Value Date   TRIG 41.0 03/09/2018   Lab Results  Component Value Date   CHOLHDL 2 03/09/2018   Lab Results  Component Value Date   PSA 0.26 03/09/2018   PSA 0.30  12/09/2016   PSA 0.21 12/11/2015   Lab Results  Component Value Date   HGBA1C 6.9 (H) 06/08/2018    IMPRESSION AND PLAN:  1) DM 2: historically well controlled. HbA1c today. Flu vaccine today.  2) Chronic/recurrent bilat LL cellulitis (a complication of chronic venous insufficiency edema and recurrent venous stasis ulcers): continue with wound clinic, current diuretic dosing, finish current rx for doxycycline, and his monthly pen G IM given today.  3) CRI with GFR 60s: tries to hydrate adequately, avoids NSAIDs. BMET today.  4) HTN: The current medical regimen is effective;  continue present plan and medications. Lytes/cr today.  5) Chronic a-fib: on dilt and lopressor for rate control, xarelto for anticoag. No sign of bleeding.  An After Visit Summary was printed and given to the patient.  FOLLOW UP: Return in about 3 months (around 12/09/2018) for routine chronic illness f/u.  Signed:  Santiago Bumpers, MD           09/08/2018

## 2018-09-11 DIAGNOSIS — L03116 Cellulitis of left lower limb: Secondary | ICD-10-CM | POA: Diagnosis not present

## 2018-09-11 DIAGNOSIS — I10 Essential (primary) hypertension: Secondary | ICD-10-CM | POA: Diagnosis not present

## 2018-09-11 DIAGNOSIS — Z794 Long term (current) use of insulin: Secondary | ICD-10-CM | POA: Diagnosis not present

## 2018-09-11 DIAGNOSIS — E11622 Type 2 diabetes mellitus with other skin ulcer: Secondary | ICD-10-CM | POA: Diagnosis not present

## 2018-09-11 DIAGNOSIS — I87333 Chronic venous hypertension (idiopathic) 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: Secondary | ICD-10-CM | POA: Diagnosis not present

## 2018-09-11 DIAGNOSIS — L97522 Non-pressure chronic ulcer of other part of left foot with fat layer exposed: Secondary | ICD-10-CM | POA: Diagnosis not present

## 2018-09-11 DIAGNOSIS — L97222 Non-pressure chronic ulcer of left calf with fat layer exposed: Secondary | ICD-10-CM | POA: Diagnosis not present

## 2018-09-11 DIAGNOSIS — E11621 Type 2 diabetes mellitus with foot ulcer: Secondary | ICD-10-CM | POA: Diagnosis not present

## 2018-09-11 DIAGNOSIS — I890 Lymphedema, not elsewhere classified: Secondary | ICD-10-CM | POA: Diagnosis not present

## 2018-09-11 DIAGNOSIS — L97822 Non-pressure chronic ulcer of other part of left lower leg with fat layer exposed: Secondary | ICD-10-CM | POA: Diagnosis not present

## 2018-09-11 DIAGNOSIS — L97819 Non-pressure chronic ulcer of other part of right lower leg with unspecified severity: Secondary | ICD-10-CM | POA: Diagnosis not present

## 2018-09-11 DIAGNOSIS — E1140 Type 2 diabetes mellitus with diabetic neuropathy, unspecified: Secondary | ICD-10-CM | POA: Diagnosis not present

## 2018-09-15 DIAGNOSIS — L97829 Non-pressure chronic ulcer of other part of left lower leg with unspecified severity: Secondary | ICD-10-CM | POA: Diagnosis not present

## 2018-09-15 DIAGNOSIS — I890 Lymphedema, not elsewhere classified: Secondary | ICD-10-CM | POA: Diagnosis not present

## 2018-09-15 DIAGNOSIS — B9562 Methicillin resistant Staphylococcus aureus infection as the cause of diseases classified elsewhere: Secondary | ICD-10-CM | POA: Diagnosis not present

## 2018-09-15 DIAGNOSIS — L97522 Non-pressure chronic ulcer of other part of left foot with fat layer exposed: Secondary | ICD-10-CM | POA: Diagnosis not present

## 2018-09-15 DIAGNOSIS — I87333 Chronic venous hypertension (idiopathic) 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: Secondary | ICD-10-CM | POA: Diagnosis not present

## 2018-09-15 DIAGNOSIS — L97822 Non-pressure chronic ulcer of other part of left lower leg with fat layer exposed: Secondary | ICD-10-CM | POA: Diagnosis not present

## 2018-09-15 DIAGNOSIS — Z794 Long term (current) use of insulin: Secondary | ICD-10-CM | POA: Diagnosis not present

## 2018-09-15 DIAGNOSIS — E11622 Type 2 diabetes mellitus with other skin ulcer: Secondary | ICD-10-CM | POA: Diagnosis not present

## 2018-09-15 DIAGNOSIS — E1140 Type 2 diabetes mellitus with diabetic neuropathy, unspecified: Secondary | ICD-10-CM | POA: Diagnosis not present

## 2018-09-15 DIAGNOSIS — L97819 Non-pressure chronic ulcer of other part of right lower leg with unspecified severity: Secondary | ICD-10-CM | POA: Diagnosis not present

## 2018-09-15 DIAGNOSIS — I10 Essential (primary) hypertension: Secondary | ICD-10-CM | POA: Diagnosis not present

## 2018-09-15 DIAGNOSIS — L97222 Non-pressure chronic ulcer of left calf with fat layer exposed: Secondary | ICD-10-CM | POA: Diagnosis not present

## 2018-09-15 DIAGNOSIS — E11621 Type 2 diabetes mellitus with foot ulcer: Secondary | ICD-10-CM | POA: Diagnosis not present

## 2018-09-15 DIAGNOSIS — L03116 Cellulitis of left lower limb: Secondary | ICD-10-CM | POA: Diagnosis not present

## 2018-09-18 DIAGNOSIS — L97522 Non-pressure chronic ulcer of other part of left foot with fat layer exposed: Secondary | ICD-10-CM | POA: Diagnosis not present

## 2018-09-18 DIAGNOSIS — L03116 Cellulitis of left lower limb: Secondary | ICD-10-CM | POA: Diagnosis not present

## 2018-09-18 DIAGNOSIS — I10 Essential (primary) hypertension: Secondary | ICD-10-CM | POA: Diagnosis not present

## 2018-09-18 DIAGNOSIS — L97822 Non-pressure chronic ulcer of other part of left lower leg with fat layer exposed: Secondary | ICD-10-CM | POA: Diagnosis not present

## 2018-09-18 DIAGNOSIS — I890 Lymphedema, not elsewhere classified: Secondary | ICD-10-CM | POA: Diagnosis not present

## 2018-09-18 DIAGNOSIS — I87333 Chronic venous hypertension (idiopathic) 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: Secondary | ICD-10-CM | POA: Diagnosis not present

## 2018-09-18 DIAGNOSIS — E11622 Type 2 diabetes mellitus with other skin ulcer: Secondary | ICD-10-CM | POA: Diagnosis not present

## 2018-09-18 DIAGNOSIS — E1140 Type 2 diabetes mellitus with diabetic neuropathy, unspecified: Secondary | ICD-10-CM | POA: Diagnosis not present

## 2018-09-18 DIAGNOSIS — L97819 Non-pressure chronic ulcer of other part of right lower leg with unspecified severity: Secondary | ICD-10-CM | POA: Diagnosis not present

## 2018-09-18 DIAGNOSIS — Z794 Long term (current) use of insulin: Secondary | ICD-10-CM | POA: Diagnosis not present

## 2018-09-18 DIAGNOSIS — L97222 Non-pressure chronic ulcer of left calf with fat layer exposed: Secondary | ICD-10-CM | POA: Diagnosis not present

## 2018-09-18 DIAGNOSIS — E11621 Type 2 diabetes mellitus with foot ulcer: Secondary | ICD-10-CM | POA: Diagnosis not present

## 2018-09-22 DIAGNOSIS — Z794 Long term (current) use of insulin: Secondary | ICD-10-CM | POA: Diagnosis not present

## 2018-09-22 DIAGNOSIS — L97522 Non-pressure chronic ulcer of other part of left foot with fat layer exposed: Secondary | ICD-10-CM | POA: Diagnosis not present

## 2018-09-22 DIAGNOSIS — I87333 Chronic venous hypertension (idiopathic) 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: Secondary | ICD-10-CM | POA: Diagnosis not present

## 2018-09-22 DIAGNOSIS — L97222 Non-pressure chronic ulcer of left calf with fat layer exposed: Secondary | ICD-10-CM | POA: Diagnosis not present

## 2018-09-22 DIAGNOSIS — I10 Essential (primary) hypertension: Secondary | ICD-10-CM | POA: Diagnosis not present

## 2018-09-22 DIAGNOSIS — I872 Venous insufficiency (chronic) (peripheral): Secondary | ICD-10-CM | POA: Diagnosis not present

## 2018-09-22 DIAGNOSIS — I890 Lymphedema, not elsewhere classified: Secondary | ICD-10-CM | POA: Diagnosis not present

## 2018-09-22 DIAGNOSIS — L03116 Cellulitis of left lower limb: Secondary | ICD-10-CM | POA: Diagnosis not present

## 2018-09-22 DIAGNOSIS — E11621 Type 2 diabetes mellitus with foot ulcer: Secondary | ICD-10-CM | POA: Diagnosis not present

## 2018-09-22 DIAGNOSIS — E11622 Type 2 diabetes mellitus with other skin ulcer: Secondary | ICD-10-CM | POA: Diagnosis not present

## 2018-09-22 DIAGNOSIS — L97819 Non-pressure chronic ulcer of other part of right lower leg with unspecified severity: Secondary | ICD-10-CM | POA: Diagnosis not present

## 2018-09-22 DIAGNOSIS — E1140 Type 2 diabetes mellitus with diabetic neuropathy, unspecified: Secondary | ICD-10-CM | POA: Diagnosis not present

## 2018-09-22 DIAGNOSIS — L97822 Non-pressure chronic ulcer of other part of left lower leg with fat layer exposed: Secondary | ICD-10-CM | POA: Diagnosis not present

## 2018-09-28 DIAGNOSIS — L97921 Non-pressure chronic ulcer of unspecified part of left lower leg limited to breakdown of skin: Secondary | ICD-10-CM | POA: Diagnosis not present

## 2018-09-28 DIAGNOSIS — L97229 Non-pressure chronic ulcer of left calf with unspecified severity: Secondary | ICD-10-CM | POA: Diagnosis not present

## 2018-09-28 DIAGNOSIS — I87333 Chronic venous hypertension (idiopathic) 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: Secondary | ICD-10-CM | POA: Diagnosis not present

## 2018-09-28 DIAGNOSIS — L97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 right calf limited to breakdown of skin: Secondary | ICD-10-CM | POA: Diagnosis not present

## 2018-09-29 DIAGNOSIS — E11621 Type 2 diabetes mellitus with foot ulcer: Secondary | ICD-10-CM | POA: Diagnosis not present

## 2018-09-29 DIAGNOSIS — L97822 Non-pressure chronic ulcer of other part of left lower leg with fat layer exposed: Secondary | ICD-10-CM | POA: Diagnosis not present

## 2018-09-29 DIAGNOSIS — I87333 Chronic venous hypertension (idiopathic) 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: Secondary | ICD-10-CM | POA: Diagnosis not present

## 2018-09-29 DIAGNOSIS — L97819 Non-pressure chronic ulcer of other part of right lower leg with unspecified severity: Secondary | ICD-10-CM | POA: Diagnosis not present

## 2018-09-29 DIAGNOSIS — I890 Lymphedema, not elsewhere classified: Secondary | ICD-10-CM | POA: Diagnosis not present

## 2018-09-29 DIAGNOSIS — L97222 Non-pressure chronic ulcer of left calf with fat layer exposed: Secondary | ICD-10-CM | POA: Diagnosis not present

## 2018-09-29 DIAGNOSIS — Z794 Long term (current) use of insulin: Secondary | ICD-10-CM | POA: Diagnosis not present

## 2018-09-29 DIAGNOSIS — E1140 Type 2 diabetes mellitus with diabetic neuropathy, unspecified: Secondary | ICD-10-CM | POA: Diagnosis not present

## 2018-09-29 DIAGNOSIS — I10 Essential (primary) hypertension: Secondary | ICD-10-CM | POA: Diagnosis not present

## 2018-09-29 DIAGNOSIS — E11622 Type 2 diabetes mellitus with other skin ulcer: Secondary | ICD-10-CM | POA: Diagnosis not present

## 2018-09-29 DIAGNOSIS — L97522 Non-pressure chronic ulcer of other part of left foot with fat layer exposed: Secondary | ICD-10-CM | POA: Diagnosis not present

## 2018-09-29 DIAGNOSIS — L03116 Cellulitis of left lower limb: Secondary | ICD-10-CM | POA: Diagnosis not present

## 2018-10-06 ENCOUNTER — Other Ambulatory Visit: Payer: Self-pay | Admitting: Family Medicine

## 2018-10-06 ENCOUNTER — Encounter (HOSPITAL_BASED_OUTPATIENT_CLINIC_OR_DEPARTMENT_OTHER): Payer: Medicare Other | Attending: Internal Medicine

## 2018-10-06 ENCOUNTER — Other Ambulatory Visit: Payer: Medicare Other

## 2018-10-06 DIAGNOSIS — L97819 Non-pressure chronic ulcer of other part of right lower leg with unspecified severity: Secondary | ICD-10-CM | POA: Diagnosis not present

## 2018-10-06 DIAGNOSIS — L97822 Non-pressure chronic ulcer of other part of left lower leg with fat layer exposed: Secondary | ICD-10-CM | POA: Diagnosis not present

## 2018-10-06 DIAGNOSIS — I890 Lymphedema, not elsewhere classified: Secondary | ICD-10-CM | POA: Diagnosis not present

## 2018-10-06 DIAGNOSIS — E11622 Type 2 diabetes mellitus with other skin ulcer: Secondary | ICD-10-CM | POA: Diagnosis not present

## 2018-10-06 DIAGNOSIS — I87333 Chronic venous hypertension (idiopathic) 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: Secondary | ICD-10-CM | POA: Diagnosis not present

## 2018-10-06 DIAGNOSIS — L97522 Non-pressure chronic ulcer of other part of left foot with fat layer exposed: Secondary | ICD-10-CM | POA: Diagnosis not present

## 2018-10-06 DIAGNOSIS — E1140 Type 2 diabetes mellitus with diabetic neuropathy, unspecified: Secondary | ICD-10-CM | POA: Insufficient documentation

## 2018-10-06 DIAGNOSIS — L97812 Non-pressure chronic ulcer of other part of right lower leg with fat layer exposed: Secondary | ICD-10-CM | POA: Diagnosis not present

## 2018-10-06 DIAGNOSIS — E1151 Type 2 diabetes mellitus with diabetic peripheral angiopathy without gangrene: Secondary | ICD-10-CM | POA: Diagnosis not present

## 2018-10-06 DIAGNOSIS — I10 Essential (primary) hypertension: Secondary | ICD-10-CM | POA: Insufficient documentation

## 2018-10-06 DIAGNOSIS — L97221 Non-pressure chronic ulcer of left calf limited to breakdown of skin: Secondary | ICD-10-CM | POA: Diagnosis not present

## 2018-10-06 DIAGNOSIS — E11621 Type 2 diabetes mellitus with foot ulcer: Secondary | ICD-10-CM | POA: Insufficient documentation

## 2018-10-06 DIAGNOSIS — L97222 Non-pressure chronic ulcer of left calf with fat layer exposed: Secondary | ICD-10-CM | POA: Diagnosis not present

## 2018-10-06 DIAGNOSIS — I87312 Chronic venous hypertension (idiopathic) with ulcer of left lower extremity: Secondary | ICD-10-CM | POA: Diagnosis not present

## 2018-10-06 NOTE — Telephone Encounter (Signed)
Copied from CRM [id number]. Topic: Quick Communication - Rx Refill/Question >> Oct 06, 2018 10:06 AM Fanny BienIlderton, Jessica L wrote: Medication: HYDROcodone-acetaminophen (NORCO/VICODIN) 5-325 MG tablet [[id number]][[id number]]   Has the patient contacted their pharmacy? no Preferred Pharmacy (with phone number or street name):[facility] - [place], [place] - [address] [phone] (Phone) [phone] (Fax)   Agent: Please be advised that RX refills may take up to 3 business days. We ask that you follow-up with your pharmacy.

## 2018-10-06 NOTE — Telephone Encounter (Signed)
Last Filled: 08/05/18 #42, 0 Last OV: 09/08/18

## 2018-10-07 MED ORDER — HYDROCODONE-ACETAMINOPHEN 5-325 MG PO TABS: ORAL_TABLET | ORAL | 0 refills | Status: DC

## 2018-10-07 NOTE — Telephone Encounter (Signed)
Started vicodin 08/2018 b/c pt was using aspirin in excessive doses to treat his pain. (Has chronic LB and knee pains: osteoarthritis complicated by his morbid obesity). Will do RF of vicodin at this time and at next f/u in 2-3 mo will do controlled substance contract.  Signed:  Santiago BumpersPhil McGowen, MD           10/07/2018

## 2018-10-09 DIAGNOSIS — E11621 Type 2 diabetes mellitus with foot ulcer: Secondary | ICD-10-CM | POA: Diagnosis not present

## 2018-10-09 DIAGNOSIS — E1151 Type 2 diabetes mellitus with diabetic peripheral angiopathy without gangrene: Secondary | ICD-10-CM | POA: Diagnosis not present

## 2018-10-09 DIAGNOSIS — I890 Lymphedema, not elsewhere classified: Secondary | ICD-10-CM | POA: Diagnosis not present

## 2018-10-09 DIAGNOSIS — L97812 Non-pressure chronic ulcer of other part of right lower leg with fat layer exposed: Secondary | ICD-10-CM | POA: Diagnosis not present

## 2018-10-09 DIAGNOSIS — I10 Essential (primary) hypertension: Secondary | ICD-10-CM | POA: Diagnosis not present

## 2018-10-09 DIAGNOSIS — L97522 Non-pressure chronic ulcer of other part of left foot with fat layer exposed: Secondary | ICD-10-CM | POA: Diagnosis not present

## 2018-10-09 DIAGNOSIS — L97819 Non-pressure chronic ulcer of other part of right lower leg with unspecified severity: Secondary | ICD-10-CM | POA: Diagnosis not present

## 2018-10-09 DIAGNOSIS — I87333 Chronic venous hypertension (idiopathic) 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: Secondary | ICD-10-CM | POA: Diagnosis not present

## 2018-10-09 DIAGNOSIS — L97221 Non-pressure chronic ulcer of left calf limited to breakdown of skin: Secondary | ICD-10-CM | POA: Diagnosis not present

## 2018-10-09 DIAGNOSIS — E1140 Type 2 diabetes mellitus with diabetic neuropathy, unspecified: Secondary | ICD-10-CM | POA: Diagnosis not present

## 2018-10-09 DIAGNOSIS — L97822 Non-pressure chronic ulcer of other part of left lower leg with fat layer exposed: Secondary | ICD-10-CM | POA: Diagnosis not present

## 2018-10-09 DIAGNOSIS — E11622 Type 2 diabetes mellitus with other skin ulcer: Secondary | ICD-10-CM | POA: Diagnosis not present

## 2018-10-12 ENCOUNTER — Other Ambulatory Visit: Payer: Self-pay | Admitting: Family Medicine

## 2018-10-13 ENCOUNTER — Ambulatory Visit (INDEPENDENT_AMBULATORY_CARE_PROVIDER_SITE_OTHER): Payer: Medicare Other | Admitting: Family Medicine

## 2018-10-13 DIAGNOSIS — I890 Lymphedema, not elsewhere classified: Secondary | ICD-10-CM | POA: Diagnosis not present

## 2018-10-13 DIAGNOSIS — L97812 Non-pressure chronic ulcer of other part of right lower leg with fat layer exposed: Secondary | ICD-10-CM | POA: Diagnosis not present

## 2018-10-13 DIAGNOSIS — L03119 Cellulitis of unspecified part of limb: Secondary | ICD-10-CM

## 2018-10-13 DIAGNOSIS — L97222 Non-pressure chronic ulcer of left calf with fat layer exposed: Secondary | ICD-10-CM | POA: Diagnosis not present

## 2018-10-13 DIAGNOSIS — L97819 Non-pressure chronic ulcer of other part of right lower leg with unspecified severity: Secondary | ICD-10-CM | POA: Diagnosis not present

## 2018-10-13 DIAGNOSIS — I87333 Chronic venous hypertension (idiopathic) 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: Secondary | ICD-10-CM | POA: Diagnosis not present

## 2018-10-13 DIAGNOSIS — L97822 Non-pressure chronic ulcer of other part of left lower leg with fat layer exposed: Secondary | ICD-10-CM | POA: Diagnosis not present

## 2018-10-13 DIAGNOSIS — L0390 Cellulitis, unspecified: Secondary | ICD-10-CM

## 2018-10-13 DIAGNOSIS — I87332 Chronic venous hypertension (idiopathic) with ulcer and inflammation of left lower extremity: Secondary | ICD-10-CM | POA: Diagnosis not present

## 2018-10-13 DIAGNOSIS — E11621 Type 2 diabetes mellitus with foot ulcer: Secondary | ICD-10-CM | POA: Diagnosis not present

## 2018-10-13 DIAGNOSIS — L97221 Non-pressure chronic ulcer of left calf limited to breakdown of skin: Secondary | ICD-10-CM | POA: Diagnosis not present

## 2018-10-13 DIAGNOSIS — I10 Essential (primary) hypertension: Secondary | ICD-10-CM | POA: Diagnosis not present

## 2018-10-13 DIAGNOSIS — E11622 Type 2 diabetes mellitus with other skin ulcer: Secondary | ICD-10-CM | POA: Diagnosis not present

## 2018-10-13 DIAGNOSIS — L97522 Non-pressure chronic ulcer of other part of left foot with fat layer exposed: Secondary | ICD-10-CM | POA: Diagnosis not present

## 2018-10-13 DIAGNOSIS — E1140 Type 2 diabetes mellitus with diabetic neuropathy, unspecified: Secondary | ICD-10-CM | POA: Diagnosis not present

## 2018-10-13 DIAGNOSIS — E1151 Type 2 diabetes mellitus with diabetic peripheral angiopathy without gangrene: Secondary | ICD-10-CM | POA: Diagnosis not present

## 2018-10-13 MED ORDER — PENICILLIN G BENZATHINE 1200000 UNIT/2ML IM SUSP
600000.0000 [IU] | INTRAMUSCULAR | Status: DC
  Administered 2019-03-12 – 2021-06-13 (×23): 600000 [IU] via INTRAMUSCULAR

## 2018-10-13 MED ORDER — PENICILLIN G BENZATHINE 1200000 UNIT/2ML IM SUSP
600000.0000 [IU] | INTRAMUSCULAR | Status: DC
  Administered 2019-03-12 – 2021-06-13 (×22): 600000 [IU] via INTRAMUSCULAR

## 2018-10-13 NOTE — Progress Notes (Signed)
Patient presented today for monthly Bicillin injections.  Patient received two 60,000 Units of Bicillin LA, one in each upper outer quadrant, patient tolerated well.

## 2018-10-18 NOTE — Progress Notes (Addendum)
Name: [name]  DOB: [birth year]  MRN: [id number]  PCP: [name], MD  Referring Provider: Dr. Leanord Hawking    Patient Active Problem List   Diagnosis Date Noted  . Uncontrolled type 2 diabetes mellitus with hyperglycemia (HCC) 03/04/2018  . Venous stasis ulcer of ankle with fat layer exposed (HCC) 12/15/2017  . Chronic diastolic heart failure (HCC) 12/24/2016  . Peripheral edema 02/22/2016  . 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  . Chronic headaches 05/10/2015  . Venous stasis of both lower extremities 02/14/2015  . Equivocal stress test 10/24/2014  . Atrial fibrillation (HCC) 08/15/2014  . Healthcare maintenance 08/15/2014  . Type 2 diabetes mellitus with diabetic neuropathy (HCC) 08/15/2014  . 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 Cellulitis of left lower extremity 08/02/2013  . Lymphedema of both lower extremities 11/04/2008  . Morbid obesity due to excess calories (HCC) 12/14/2007  . Venous (peripheral) insufficiency 07/22/2007  . Diabetic renal disease (HCC) 04/20/2007  . DM2 (diabetes mellitus, type 2) (HCC) 08/11/2006  . Hypertension, essential 08/11/2006  . ALLERGIC RHINITIS 08/11/2006  . ASTHMA 08/11/2006   Subjective:  Brief Narrative:   [name] is a 67 y.o. male referred for evaluation of his recurrent cellulitis of his lower extremities. He has been followed closely by the [facility] since March 2017. He has a long standing history of morbid obesity, diabetes, B/L venous stasis with ulcerations, chronic lower extremity swelling / lymphadenopathy and weeping. Significant pmhx includes T2DM with insulin requirement, morbid obesity, diastolic CHF and significant swelling of his lower extremities / lymphedema. Last HgbA1C 6.9% in Feb 2019. ABI in April 2017 did not require intervention and LE venous dopplers 10/2016 without deep or superficial thrombus. He has been treated several times with combination of Doxycycline,  Amoxicillin and Augmentin. Previously we tried prophylactic Amoxicillin and Clindamycin without effect. He did have some improvement on Bicillin 1.2 million units IM Q4w however after 2 doses he did not return and decided to trial off therapy. He was referred back to resume injections from his wound care team as they were felt to be helpful to suppress intervals between infections.   CC:  Follow up on recurrent lower extremity cellulitis infections on prophylactic bicillin injections. His wrap is very painful today and he is worried about this.  HPI: Right leg has nearly healed up with Unna boot uses; there are "a few tiny pinhole open areas that weep" but no pain or infection in this leg. His left leg however has caused a lot of trouble for him recently. He has a lot of pain in the left posterior popliteal fossa and had to cut off some of his dressing d/t severe pain. Currently on day 4 of doxycycline therapy but does not feel that he has improved much like before. He estimates this is the 3rd flare he has required treatment for for the left leg. He denies any fevers but reports some chills at times. Has been compliant with injections q4w since our last OV in August-19 with his primary care provider team and reports that he feels that they have helped reduce flares for him. He is getting ready to start a new weight loss program.   Review of Systems  Constitutional: Negative for chills, fever, malaise/fatigue and weight loss.  HENT: Negative for sore throat.   Respiratory: Negative for cough and sputum production.   Cardiovascular: Positive for leg swelling. Negative for chest pain, palpitations and claudication.  Gastrointestinal: Negative for abdominal pain,  diarrhea and vomiting.  Genitourinary: Negative for dysuria and flank pain.  Musculoskeletal: Positive for joint pain.  Skin: Negative for rash.  Neurological: Positive for dizziness. Negative for headaches.    Past Medical History:    Diagnosis Date  . ALLERGIC RHINITIS 08/11/2006  . ASTHMA 08/11/2006  . Chronic atrial fibrillation 08/2014  . Chronic combined systolic and diastolic CHF (congestive heart failure) [facility](HCC)    Cardiology f/u 12/2017: pt volume overloaded (R heart dysf suspected), BNP very high, lasix increased.  Repeat echo 12/2017: normal LV EF, mild DD, +RV syst dysfxn, mod pulm HTN, biatrial enlgmt.  . Chronic constipation   . Chronic renal insufficiency, stage 2 (mild)    Borderline stage III (GFR 60s).  . DIABETES MELLITUS, TYPE II 08/11/2006  . DM W/RENAL MANIFESTATIONS, TYPE II 04/20/2007  . HYPERTENSION 08/11/2006  . MRSA infection 06/2018   LL venous stasis ulcer infected  . Normocytic anemia 2016-2019   03/2018 B12 normal, iron ok (ferritin borderline low).  Plan repeat CBC at f/u summer 2019 and if decreased from baseline will check hemoccults and iron labs again.  . OBESITY, MORBID 12/14/2007  . 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  92018 ID started him on amoxil prophylaxis---ineffective.  End 2018/Jan 2019 penicillin G injections prophyl helpful but pt declined to continue this as of 01/2018 ID f/u.  ID talked him into resuming monthly penicillin G as of 02/2018 f/u.  Marland Kitchen. Restless leg syndrome    Rx'd clonazepam 09/2017 and pt refused to take it after reading the medication's potential side effects.  . Venous stasis ulcers of both lower extremities (HCC)    wound clinic care ongoing as of 01/2018   Outpatient Medications Prior to Visit  Medication Sig Dispense Refill  . b complex vitamins capsule Take 1 capsule by mouth every morning.    . butalbital-acetaminophen-caffeine (FIORICET WITH CODEINE) 50-325-40-30 MG capsule TAKE 1 TO 2 CAPSULES BY  MOUTH EVERY 6 HOURS AS  NEEDED FOR HEADACHE(S)  MANUFACTURER RECOMMENDS NOT EXCEEDING 6 CAPSULES/DAY 60 capsule 1  . cetirizine (ZYRTEC) 10 MG tablet Take 10 mg by mouth daily.    . Cinnamon 500 MG  TABS Take by mouth. Reported on 02/19/2016    . Coenzyme Q10 (CO Q 10) 10 MG CAPS Take by mouth. Reported on 02/19/2016    . diltiazem (TIAZAC) 360 MG 24 hr capsule TAKE 1 CAPSULE BY MOUTH  DAILY AS DIRECTED 90 capsule 1  . Forskolin POWD by Does not apply route.    . furosemide (LASIX) 40 MG tablet TAKE 2 TABLETS BY MOUTH TWO TIMES DAILY 360 tablet 1  . glucose blood (ONE TOUCH ULTRA TEST) test strip CHECK BLOOD SUGAR TWO TIMES DAILY 100 each 11  . HUMALOG MIX 75/25 KWIKPEN (75-25) 100 UNIT/ML Kwikpen INJECT SUBCUTANEOUSLY 25  UNITS EVERY MORNING AND 20  UNITS EVERY EVENING AT  SUPPER 45 mL 1  . HYDROcodone-acetaminophen (NORCO/VICODIN) 5-325 MG tablet 1-2 tabs po bid prn pain 60 tablet 0  . Insulin Pen Needle (BD ULTRA-FINE PEN NEEDLES) 29G X 12.7MM MISC 1 each by Other route 2 (two) times daily. 200 each 2  . Insulin Pen Needle (BD ULTRA-FINE PEN NEEDLES) 29G X 12.7MM MISC USE TWICE DAILY 200 each 3  . Insulin Pen Needle (RELION PEN NEEDLE 31G/8MM) 31G X 8 MM MISC 1 each by Does not apply route 2 (two) times daily.    Marland Kitchen. lisinopril (PRINIVIL,ZESTRIL) 40 MG tablet  TAKE 1 TABLET BY MOUTH  EVERY DAY 90 tablet 1  . metFORMIN (GLUCOPHAGE) 1000 MG tablet TAKE 1 TABLET BY MOUTH  TWICE A DAY WITH MEALS 180 tablet 1  . metoprolol tartrate (LOPRESSOR) 50 MG tablet TAKE 1 AND 1/2 TABLETS BY  MOUTH TWO TIMES DAILY 270 tablet 1  . Multiple Vitamin (MULTIVITAMIN) tablet Take 1 tablet by mouth daily.    . Nutritional Supplements (GRAPESEED EXTRACT PO) Take 1 capsule by mouth daily.    . saw palmetto 500 MG capsule Take 500 mg by mouth daily.    Marland Kitchen terazosin (HYTRIN) 10 MG capsule TAKE 1 CAPSULE BY MOUTH  ONCE A DAY AT BEDTIME 90 capsule 1  . terazosin (HYTRIN) 10 MG capsule TAKE 1 CAPSULE BY MOUTH  ONCE A DAY AT BEDTIME 90 capsule 1  . vitamin E 400 UNIT capsule Take 400 Units by mouth every morning. Selenium 50mg     . XARELTO 20 MG TABS tablet Take 1 tablet by mouth daily.  1  . XARELTO 20 MG TABS tablet TAKE  1 TABLET BY MOUTH ONCE DAILY WITH  SUPPER 90 tablet 1  . doxycycline (MONODOX) 100 MG capsule TAKE 1 CAPSULE BY MOUTH TWICE DAILY FOR 7 DAYS  0   Facility-Administered Medications Prior to Visit  Medication Dose Route Frequency Provider Last Rate Last Dose  . penicillin g benzathine (BICILLIN LA) 1200000 UNIT/2ML injection 600,000 Units  600,000 Units Intramuscular Q30 days [name], MD   600,000 Units at 10/13/18 1116  . penicillin g benzathine (BICILLIN LA) 1200000 UNIT/2ML injection 600,000 Units  600,000 Units Intramuscular Q30 days [name], MD   600,000 Units at 10/13/18 1115  . penicillin g benzathine (BICILLIN LA) 1200000 UNIT/2ML injection 600,000 Units  600,000 Units Intramuscular Q30 days McGowen, Maryjean Morn, MD      . penicillin g benzathine (BICILLIN LA) 1200000 UNIT/2ML injection 600,000 Units  600,000 Units Intramuscular Q30 days McGowen, Maryjean Morn, MD       No Known Allergies  Physical Exam and Objective Findings: Vitals:   10/19/18 0946  BP: (!) 164/85  Pulse: (!) 57  Temp: 97.6 F (36.4 C)  Weight: (!) 394 lb (178.7 kg)   Body mass index is 59.91 kg/m.  Physical Exam  Constitutional: He is oriented to person, place, and time.  Non-toxic appearance.  Seated in chair with rollator walker. Comfortable. Legs wrapped bilaterally.   Eyes: No scleral icterus.  Neck: Neck supple. No JVD present.  Cardiovascular: Normal rate, regular rhythm and intact distal pulses.  No murmur heard. Pulmonary/Chest: Effort normal and breath sounds normal. He has no wheezes. He has no rales.  Abdominal: Soft. He exhibits no distension. There is no abdominal tenderness.  Musculoskeletal:        General: Edema present. No tenderness.     Comments: Rollator walker for walking aid.  Bilateral legs wrapped with treated dressing. Politely declines to remove them as they were just applied.   Neurological: He is alert and oriented to person, place, and time.  Skin: Skin is warm  and dry.  Psychiatric: Affect normal.  Vitals reviewed.  R Leg wrapped in unna boot - no ascending erythema.   L Leg with ascending erythema and bleeding noted at lateral popliteal fossa on his posterior leg. Dressing was removed d/t concern over being to tight. There were two open areas - one at the area of bleeding that looked clean (picture did not save unfortunately). Second area posterior calf with some purulence  noted on dressings. Surrounding erythema/edema/swelling and warmth coming across from popliteal fossa to anterior shin. No pain anteriorly, mild warmth may represent hyperemic tissue.       ASSESSMENT & PLAN:  Problem List Items Addressed This Visit      Unprioritized   Lymphedema of both lower extremities    Ongoing - his left toes are significantly swollen today. He tells me he wears his lymphedema pumps.       Cellulitis of left lower extremity - Primary    Reports benefit and desire to continue q4w bicillin injections. He is going to start a weight loss program - I told him that this will only help him. Would recommend a good daily multivitamin and consideration for zinc/vitamin c for wound healing to ensure no deficiencies.  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 He is intolerant of bactrim. He gets intermittent benefit from as needed treatments with doxycycline but has significant tenderness and chills 4d into current regimen. I told him today that his isolate has demonstrated worsening resistance to antibiotics and need for judicious use. That being said he does appear to have ongoing flare today. Will send in 7d linezolid 600 mg BID for him to use. He had candida parapsilosis in the past that likely is colonizer of skin (superficial swab and wound has improved w/o treatment). He will meet with wound care team today to re-wrap instead of tomorrow.  He can return in 6 months or sooner if needed.         Return in about  6 months (around 04/20/2019).  Rexene Alberts, MSN, [facility]  10/19/2018

## 2018-10-19 ENCOUNTER — Encounter: Payer: Self-pay | Admitting: Infectious Diseases

## 2018-10-19 ENCOUNTER — Ambulatory Visit (INDEPENDENT_AMBULATORY_CARE_PROVIDER_SITE_OTHER): Payer: Medicare Other | Admitting: Infectious Diseases

## 2018-10-19 VITALS — BP 164/85 | HR 57 | Temp 97.6°F | Wt 394.0 lb

## 2018-10-19 DIAGNOSIS — E1140 Type 2 diabetes mellitus with diabetic neuropathy, unspecified: Secondary | ICD-10-CM | POA: Diagnosis not present

## 2018-10-19 DIAGNOSIS — L97222 Non-pressure chronic ulcer of left calf with fat layer exposed: Secondary | ICD-10-CM | POA: Diagnosis not present

## 2018-10-19 DIAGNOSIS — I87332 Chronic venous hypertension (idiopathic) with ulcer and inflammation of left lower extremity: Secondary | ICD-10-CM | POA: Diagnosis not present

## 2018-10-19 DIAGNOSIS — L03119 Cellulitis of unspecified part of limb: Secondary | ICD-10-CM

## 2018-10-19 DIAGNOSIS — L97522 Non-pressure chronic ulcer of other part of left foot with fat layer exposed: Secondary | ICD-10-CM | POA: Diagnosis not present

## 2018-10-19 DIAGNOSIS — L03116 Cellulitis of left lower limb: Secondary | ICD-10-CM

## 2018-10-19 DIAGNOSIS — L97812 Non-pressure chronic ulcer of other part of right lower leg with fat layer exposed: Secondary | ICD-10-CM | POA: Diagnosis not present

## 2018-10-19 DIAGNOSIS — E11622 Type 2 diabetes mellitus with other skin ulcer: Secondary | ICD-10-CM | POA: Diagnosis not present

## 2018-10-19 DIAGNOSIS — E1151 Type 2 diabetes mellitus with diabetic peripheral angiopathy without gangrene: Secondary | ICD-10-CM | POA: Diagnosis not present

## 2018-10-19 DIAGNOSIS — L97819 Non-pressure chronic ulcer of other part of right lower leg with unspecified severity: Secondary | ICD-10-CM | POA: Diagnosis not present

## 2018-10-19 DIAGNOSIS — L97822 Non-pressure chronic ulcer of other part of left lower leg with fat layer exposed: Secondary | ICD-10-CM | POA: Diagnosis not present

## 2018-10-19 DIAGNOSIS — L97221 Non-pressure chronic ulcer of left calf limited to breakdown of skin: Secondary | ICD-10-CM | POA: Diagnosis not present

## 2018-10-19 DIAGNOSIS — I10 Essential (primary) hypertension: Secondary | ICD-10-CM | POA: Diagnosis not present

## 2018-10-19 DIAGNOSIS — I890 Lymphedema, not elsewhere classified: Secondary | ICD-10-CM

## 2018-10-19 DIAGNOSIS — L97229 Non-pressure chronic ulcer of left calf with unspecified severity: Secondary | ICD-10-CM | POA: Diagnosis not present

## 2018-10-19 DIAGNOSIS — E11621 Type 2 diabetes mellitus with foot ulcer: Secondary | ICD-10-CM | POA: Diagnosis not present

## 2018-10-19 DIAGNOSIS — I87333 Chronic venous hypertension (idiopathic) 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: Secondary | ICD-10-CM | POA: Diagnosis not present

## 2018-10-19 MED ORDER — LINEZOLID 600 MG PO TABS: 600.0000 mg | ORAL_TABLET | Freq: Two times a day (BID) | ORAL | 0 refills | Status: DC

## 2018-10-19 NOTE — Patient Instructions (Signed)
Stop your doxycycline - please start taking linezolid for 7 days, one pill twice a day.  This is going to target MRSA -I worry your bacteria has grown completely resistant to doxycycline.  We need to continue to be judicious about antibiotic use for you as there are not as many oral pill options left. Can consider a long acting injection of a medication called Dalbavancin if needed - we can assist to arrange.   Will hold off for antifungal at this time.   Please return to see your wound care team today and can follow up with me again in 6 months. We will continue the once a month injections of bicillin for you.   Happy Holidays! Good luck on your weight loss program!

## 2018-10-19 NOTE — Assessment & Plan Note (Addendum)
Reports benefit and desire to continue q4w bicillin injections. He is going to start a weight loss program - I told him that this will only help him. Would recommend a good daily multivitamin and consideration for zinc/vitamin c for wound healing to ensure no deficiencies.  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 He is intolerant of bactrim. He gets intermittent benefit from as needed treatments with doxycycline but has significant tenderness and chills 4d into current regimen. I told him today that his isolate has demonstrated worsening resistance to antibiotics and need for judicious use. That being said he does appear to have ongoing flare today. Will send in 7d linezolid 600 mg BID for him to use. He had candida parapsilosis in the past that likely is colonizer of skin (superficial swab and wound has improved w/o treatment). He will meet with wound care team today to re-wrap instead of tomorrow.  He can return in 6 months or sooner if needed.

## 2018-10-19 NOTE — Assessment & Plan Note (Signed)
Ongoing - his left toes are significantly swollen today. He tells me he wears his lymphedema pumps.

## 2018-10-21 DIAGNOSIS — H43813 Vitreous degeneration, bilateral: Secondary | ICD-10-CM | POA: Diagnosis not present

## 2018-10-21 DIAGNOSIS — E113493 Type 2 diabetes mellitus with severe nonproliferative diabetic retinopathy without macular edema, bilateral: Secondary | ICD-10-CM | POA: Diagnosis not present

## 2018-10-23 ENCOUNTER — Ambulatory Visit: Payer: Self-pay | Admitting: *Deleted

## 2018-10-23 ENCOUNTER — Other Ambulatory Visit: Payer: Self-pay | Admitting: Family Medicine

## 2018-10-23 DIAGNOSIS — E1121 Type 2 diabetes mellitus with diabetic nephropathy: Secondary | ICD-10-CM | POA: Diagnosis not present

## 2018-10-23 DIAGNOSIS — E11621 Type 2 diabetes mellitus with foot ulcer: Secondary | ICD-10-CM | POA: Diagnosis not present

## 2018-10-23 DIAGNOSIS — E1151 Type 2 diabetes mellitus with diabetic peripheral angiopathy without gangrene: Secondary | ICD-10-CM | POA: Diagnosis not present

## 2018-10-23 DIAGNOSIS — L97819 Non-pressure chronic ulcer of other part of right lower leg with unspecified severity: Secondary | ICD-10-CM | POA: Diagnosis not present

## 2018-10-23 DIAGNOSIS — L97822 Non-pressure chronic ulcer of other part of left lower leg with fat layer exposed: Secondary | ICD-10-CM | POA: Diagnosis not present

## 2018-10-23 DIAGNOSIS — E1140 Type 2 diabetes mellitus with diabetic neuropathy, unspecified: Secondary | ICD-10-CM | POA: Diagnosis not present

## 2018-10-23 DIAGNOSIS — I10 Essential (primary) hypertension: Secondary | ICD-10-CM | POA: Diagnosis not present

## 2018-10-23 DIAGNOSIS — I5032 Chronic diastolic (congestive) heart failure: Secondary | ICD-10-CM | POA: Diagnosis not present

## 2018-10-23 DIAGNOSIS — I110 Hypertensive heart disease with heart failure: Secondary | ICD-10-CM | POA: Diagnosis not present

## 2018-10-23 DIAGNOSIS — L97221 Non-pressure chronic ulcer of left calf limited to breakdown of skin: Secondary | ICD-10-CM | POA: Diagnosis not present

## 2018-10-23 DIAGNOSIS — L97812 Non-pressure chronic ulcer of other part of right lower leg with fat layer exposed: Secondary | ICD-10-CM | POA: Diagnosis not present

## 2018-10-23 DIAGNOSIS — L97522 Non-pressure chronic ulcer of other part of left foot with fat layer exposed: Secondary | ICD-10-CM | POA: Diagnosis not present

## 2018-10-23 DIAGNOSIS — I890 Lymphedema, not elsewhere classified: Secondary | ICD-10-CM | POA: Diagnosis not present

## 2018-10-23 DIAGNOSIS — E11622 Type 2 diabetes mellitus with other skin ulcer: Secondary | ICD-10-CM | POA: Diagnosis not present

## 2018-10-23 DIAGNOSIS — I87333 Chronic venous hypertension (idiopathic) 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: Secondary | ICD-10-CM | POA: Diagnosis not present

## 2018-10-23 MED ORDER — DILTIAZEM HCL ER BEADS 240 MG PO CP24: 240.0000 mg | ORAL_CAPSULE | Freq: Every day | ORAL | 0 refills | Status: DC

## 2018-10-23 NOTE — Telephone Encounter (Signed)
OK.  I've reviewed his heart rates from our office visits and his latest EKG in our EMR and I am worried he may be on too much medication to control his heart rate from going too fast. I recommend he decrease his diltiazem to 240mg  tab once a day.  Stop the diltiazem 360 mg tab. I'll send in new rx. Keep f/u with me as planned.

## 2018-10-23 NOTE — Telephone Encounter (Signed)
FYI

## 2018-10-23 NOTE — Telephone Encounter (Signed)
Pt reports EKG done today while at [facility] States BP was 114/42  HR47. Pt states brief episode of lightheadedness after ambulating with trainer. States resolved quickly after sitting down. Pt was told to notify PCP. Pt denies any other episodes of dizziness, none presently. Pt has no other values as "I'm a big man and the cuffs anywhere won't fit my arm." Pt denies any nausea or visual changes with lightheadedness. TN spoke with Diane, appt secured for Monday. Pt made aware if dizziness worsens in intensity or frequency, nausea, visual changes occur,go to ED. Pt verbalizes understanding. Reason for Disposition . Diastolic BP < 50 mm Hg  Answer Assessment - Initial Assessment Questions 1. BLOOD PRESSURE: "What is the blood pressure?" "Did you take at least two measurements 5 minutes apart?"    114/42  HR47 2. ONSET: "When did you take your blood pressure?"    [facility] 3. HOW: "How did you obtain the blood pressure?" (e.g., visiting nurse, automatic home BP monitor)    At Clinic 4. HISTORY: "Do you have a history of low blood pressure?" "What is your blood pressure normally?"    no 5. MEDICATIONS: "Are you taking any medications for blood pressure?" If yes: "Have they been changed recently?"     no 6. PULSE RATE: "Do you know what your pulse rate is?"      47 7. OTHER SYMPTOMS: "Have you been sick recently?" "Have you had a recent injury?"    Lightheadedness after walking with trainer at clinic. First and only episode, resolved quickly after sitting down.  Protocols used: LOW BLOOD PRESSURE-A-AH

## 2018-10-23 NOTE — Telephone Encounter (Signed)
Pt advised and voiced understanding.    [facility] [address] [place] NephewPt only wants #[phone] sent.

## 2018-10-23 NOTE — Telephone Encounter (Signed)
OK, done

## 2018-10-26 ENCOUNTER — Ambulatory Visit: Payer: Medicare Other | Admitting: Family Medicine

## 2018-10-26 DIAGNOSIS — S81801A Unspecified open wound, right lower leg, initial encounter: Secondary | ICD-10-CM | POA: Diagnosis not present

## 2018-10-26 DIAGNOSIS — E1151 Type 2 diabetes mellitus with diabetic peripheral angiopathy without gangrene: Secondary | ICD-10-CM | POA: Diagnosis not present

## 2018-10-26 DIAGNOSIS — I87333 Chronic venous hypertension (idiopathic) 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: Secondary | ICD-10-CM | POA: Diagnosis not present

## 2018-10-26 DIAGNOSIS — S81802A Unspecified open wound, left lower leg, initial encounter: Secondary | ICD-10-CM | POA: Diagnosis not present

## 2018-10-26 DIAGNOSIS — S91105A Unspecified open wound of left lesser toe(s) without damage to nail, initial encounter: Secondary | ICD-10-CM | POA: Diagnosis not present

## 2018-10-26 DIAGNOSIS — I87312 Chronic venous hypertension (idiopathic) with ulcer of left lower extremity: Secondary | ICD-10-CM | POA: Diagnosis not present

## 2018-10-26 DIAGNOSIS — L97222 Non-pressure chronic ulcer of left calf with fat layer exposed: Secondary | ICD-10-CM | POA: Diagnosis not present

## 2018-10-26 DIAGNOSIS — E1140 Type 2 diabetes mellitus with diabetic neuropathy, unspecified: Secondary | ICD-10-CM | POA: Diagnosis not present

## 2018-10-26 DIAGNOSIS — L97819 Non-pressure chronic ulcer of other part of right lower leg with unspecified severity: Secondary | ICD-10-CM | POA: Diagnosis not present

## 2018-10-26 DIAGNOSIS — E11621 Type 2 diabetes mellitus with foot ulcer: Secondary | ICD-10-CM | POA: Diagnosis not present

## 2018-10-26 DIAGNOSIS — L97822 Non-pressure chronic ulcer of other part of left lower leg with fat layer exposed: Secondary | ICD-10-CM | POA: Diagnosis not present

## 2018-10-26 DIAGNOSIS — I890 Lymphedema, not elsewhere classified: Secondary | ICD-10-CM | POA: Diagnosis not present

## 2018-10-26 DIAGNOSIS — I10 Essential (primary) hypertension: Secondary | ICD-10-CM | POA: Diagnosis not present

## 2018-10-26 DIAGNOSIS — L97812 Non-pressure chronic ulcer of other part of right lower leg with fat layer exposed: Secondary | ICD-10-CM | POA: Diagnosis not present

## 2018-10-26 DIAGNOSIS — E11622 Type 2 diabetes mellitus with other skin ulcer: Secondary | ICD-10-CM | POA: Diagnosis not present

## 2018-10-26 DIAGNOSIS — L97221 Non-pressure chronic ulcer of left calf limited to breakdown of skin: Secondary | ICD-10-CM | POA: Diagnosis not present

## 2018-10-26 DIAGNOSIS — L97522 Non-pressure chronic ulcer of other part of left foot with fat layer exposed: Secondary | ICD-10-CM | POA: Diagnosis not present

## 2018-10-29 ENCOUNTER — Ambulatory Visit: Payer: Medicare Other | Admitting: Family Medicine

## 2018-10-29 DIAGNOSIS — E1151 Type 2 diabetes mellitus with diabetic peripheral angiopathy without gangrene: Secondary | ICD-10-CM | POA: Diagnosis not present

## 2018-10-29 DIAGNOSIS — L97222 Non-pressure chronic ulcer of left calf with fat layer exposed: Secondary | ICD-10-CM | POA: Diagnosis not present

## 2018-10-29 DIAGNOSIS — L97812 Non-pressure chronic ulcer of other part of right lower leg with fat layer exposed: Secondary | ICD-10-CM | POA: Diagnosis not present

## 2018-10-29 DIAGNOSIS — I10 Essential (primary) hypertension: Secondary | ICD-10-CM | POA: Diagnosis not present

## 2018-10-29 DIAGNOSIS — I890 Lymphedema, not elsewhere classified: Secondary | ICD-10-CM | POA: Diagnosis not present

## 2018-10-29 DIAGNOSIS — L97522 Non-pressure chronic ulcer of other part of left foot with fat layer exposed: Secondary | ICD-10-CM | POA: Diagnosis not present

## 2018-10-29 DIAGNOSIS — L97822 Non-pressure chronic ulcer of other part of left lower leg with fat layer exposed: Secondary | ICD-10-CM | POA: Diagnosis not present

## 2018-10-29 DIAGNOSIS — L97819 Non-pressure chronic ulcer of other part of right lower leg with unspecified severity: Secondary | ICD-10-CM | POA: Diagnosis not present

## 2018-10-29 DIAGNOSIS — I87333 Chronic venous hypertension (idiopathic) 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: Secondary | ICD-10-CM | POA: Diagnosis not present

## 2018-10-29 DIAGNOSIS — L97221 Non-pressure chronic ulcer of left calf limited to breakdown of skin: Secondary | ICD-10-CM | POA: Diagnosis not present

## 2018-10-29 DIAGNOSIS — E11621 Type 2 diabetes mellitus with foot ulcer: Secondary | ICD-10-CM | POA: Diagnosis not present

## 2018-10-29 DIAGNOSIS — S91105A Unspecified open wound of left lesser toe(s) without damage to nail, initial encounter: Secondary | ICD-10-CM | POA: Diagnosis not present

## 2018-10-29 DIAGNOSIS — E11622 Type 2 diabetes mellitus with other skin ulcer: Secondary | ICD-10-CM | POA: Diagnosis not present

## 2018-10-29 DIAGNOSIS — S81801A Unspecified open wound, right lower leg, initial encounter: Secondary | ICD-10-CM | POA: Diagnosis not present

## 2018-10-29 DIAGNOSIS — E1140 Type 2 diabetes mellitus with diabetic neuropathy, unspecified: Secondary | ICD-10-CM | POA: Diagnosis not present

## 2018-10-29 DIAGNOSIS — L97229 Non-pressure chronic ulcer of left calf with unspecified severity: Secondary | ICD-10-CM | POA: Diagnosis not present

## 2018-11-02 ENCOUNTER — Other Ambulatory Visit (HOSPITAL_COMMUNITY)
Admission: RE | Admit: 2018-11-02 | Discharge: 2018-11-02 | Disposition: A | Discharge status: Home / Self Care | Payer: Medicare Other | Source: Other Acute Inpatient Hospital | Attending: Internal Medicine | Admitting: Internal Medicine

## 2018-11-02 DIAGNOSIS — S91105A Unspecified open wound of left lesser toe(s) without damage to nail, initial encounter: Secondary | ICD-10-CM | POA: Diagnosis not present

## 2018-11-02 DIAGNOSIS — S81802A Unspecified open wound, left lower leg, initial encounter: Secondary | ICD-10-CM | POA: Diagnosis not present

## 2018-11-02 DIAGNOSIS — E11622 Type 2 diabetes mellitus with other skin ulcer: Secondary | ICD-10-CM | POA: Diagnosis not present

## 2018-11-02 DIAGNOSIS — E1140 Type 2 diabetes mellitus with diabetic neuropathy, unspecified: Secondary | ICD-10-CM | POA: Diagnosis not present

## 2018-11-02 DIAGNOSIS — L97812 Non-pressure chronic ulcer of other part of right lower leg with fat layer exposed: Secondary | ICD-10-CM | POA: Diagnosis not present

## 2018-11-02 DIAGNOSIS — L97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 right calf limited to breakdown of skin: Secondary | ICD-10-CM | POA: Diagnosis not present

## 2018-11-02 DIAGNOSIS — L97221 Non-pressure chronic ulcer of left calf limited to breakdown of skin: Secondary | ICD-10-CM | POA: Diagnosis not present

## 2018-11-02 DIAGNOSIS — E11621 Type 2 diabetes mellitus with foot ulcer: Secondary | ICD-10-CM | POA: Diagnosis not present

## 2018-11-02 DIAGNOSIS — I87333 Chronic venous hypertension (idiopathic) 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: Secondary | ICD-10-CM | POA: Diagnosis not present

## 2018-11-02 DIAGNOSIS — I890 Lymphedema, not elsewhere classified: Secondary | ICD-10-CM | POA: Diagnosis not present

## 2018-11-02 DIAGNOSIS — L03115 Cellulitis of right lower limb: Secondary | ICD-10-CM | POA: Diagnosis not present

## 2018-11-02 DIAGNOSIS — L97822 Non-pressure chronic ulcer of other part of left lower leg with fat layer exposed: Secondary | ICD-10-CM | POA: Diagnosis not present

## 2018-11-02 DIAGNOSIS — L97522 Non-pressure chronic ulcer of other part of left foot with fat layer exposed: Secondary | ICD-10-CM | POA: Diagnosis not present

## 2018-11-02 DIAGNOSIS — E1151 Type 2 diabetes mellitus with diabetic peripheral angiopathy without gangrene: Secondary | ICD-10-CM | POA: Diagnosis not present

## 2018-11-02 DIAGNOSIS — L97222 Non-pressure chronic ulcer of left calf with fat layer exposed: Secondary | ICD-10-CM | POA: Diagnosis not present

## 2018-11-02 DIAGNOSIS — L97819 Non-pressure chronic ulcer of other part of right lower leg with unspecified severity: Secondary | ICD-10-CM | POA: Diagnosis not present

## 2018-11-02 DIAGNOSIS — I10 Essential (primary) hypertension: Secondary | ICD-10-CM | POA: Diagnosis not present

## 2018-11-06 ENCOUNTER — Encounter (HOSPITAL_BASED_OUTPATIENT_CLINIC_OR_DEPARTMENT_OTHER): Payer: Medicare Other | Attending: Internal Medicine

## 2018-11-06 DIAGNOSIS — I10 Essential (primary) hypertension: Secondary | ICD-10-CM | POA: Diagnosis not present

## 2018-11-06 DIAGNOSIS — Z794 Long term (current) use of insulin: Secondary | ICD-10-CM | POA: Diagnosis not present

## 2018-11-06 DIAGNOSIS — L97222 Non-pressure chronic ulcer of left calf with fat layer exposed: Secondary | ICD-10-CM | POA: Diagnosis not present

## 2018-11-06 DIAGNOSIS — Z8614 Personal history of Methicillin resistant Staphylococcus aureus infection: Secondary | ICD-10-CM | POA: Insufficient documentation

## 2018-11-06 DIAGNOSIS — L97812 Non-pressure chronic ulcer of other part of right lower leg with fat layer exposed: Secondary | ICD-10-CM | POA: Diagnosis not present

## 2018-11-06 DIAGNOSIS — L97822 Non-pressure chronic ulcer of other part of left lower leg with fat layer exposed: Secondary | ICD-10-CM | POA: Insufficient documentation

## 2018-11-06 DIAGNOSIS — L03116 Cellulitis of left lower limb: Secondary | ICD-10-CM | POA: Insufficient documentation

## 2018-11-06 DIAGNOSIS — E11621 Type 2 diabetes mellitus with foot ulcer: Secondary | ICD-10-CM | POA: Insufficient documentation

## 2018-11-06 DIAGNOSIS — E11622 Type 2 diabetes mellitus with other skin ulcer: Secondary | ICD-10-CM | POA: Insufficient documentation

## 2018-11-06 DIAGNOSIS — L97212 Non-pressure chronic ulcer of right calf with fat layer exposed: Secondary | ICD-10-CM | POA: Diagnosis not present

## 2018-11-06 DIAGNOSIS — L97529 Non-pressure chronic ulcer of other part of left foot with unspecified severity: Secondary | ICD-10-CM | POA: Insufficient documentation

## 2018-11-06 DIAGNOSIS — I890 Lymphedema, not elsewhere classified: Secondary | ICD-10-CM | POA: Diagnosis not present

## 2018-11-06 DIAGNOSIS — E1140 Type 2 diabetes mellitus with diabetic neuropathy, unspecified: Secondary | ICD-10-CM | POA: Diagnosis not present

## 2018-11-06 LAB — AEROBIC CULTURE W GRAM STAIN (SUPERFICIAL SPECIMEN): Gram Stain: NONE SEEN

## 2018-11-06 LAB — AEROBIC CULTURE  (SUPERFICIAL SPECIMEN)

## 2018-11-09 ENCOUNTER — Ambulatory Visit (INDEPENDENT_AMBULATORY_CARE_PROVIDER_SITE_OTHER): Payer: Medicare Other

## 2018-11-09 DIAGNOSIS — L97529 Non-pressure chronic ulcer of other part of left foot with unspecified severity: Secondary | ICD-10-CM | POA: Diagnosis not present

## 2018-11-09 DIAGNOSIS — Z794 Long term (current) use of insulin: Secondary | ICD-10-CM | POA: Diagnosis not present

## 2018-11-09 DIAGNOSIS — E11622 Type 2 diabetes mellitus with other skin ulcer: Secondary | ICD-10-CM | POA: Diagnosis not present

## 2018-11-09 DIAGNOSIS — I890 Lymphedema, not elsewhere classified: Secondary | ICD-10-CM | POA: Diagnosis not present

## 2018-11-09 DIAGNOSIS — I10 Essential (primary) hypertension: Secondary | ICD-10-CM | POA: Diagnosis not present

## 2018-11-09 DIAGNOSIS — E11621 Type 2 diabetes mellitus with foot ulcer: Secondary | ICD-10-CM | POA: Diagnosis not present

## 2018-11-09 DIAGNOSIS — L03119 Cellulitis of unspecified part of limb: Secondary | ICD-10-CM | POA: Diagnosis not present

## 2018-11-09 DIAGNOSIS — S91105A Unspecified open wound of left lesser toe(s) without damage to nail, initial encounter: Secondary | ICD-10-CM | POA: Diagnosis not present

## 2018-11-09 DIAGNOSIS — L97812 Non-pressure chronic ulcer of other part of right lower leg with fat layer exposed: Secondary | ICD-10-CM | POA: Diagnosis not present

## 2018-11-09 DIAGNOSIS — S81802A Unspecified open wound, left lower leg, initial encounter: Secondary | ICD-10-CM | POA: Diagnosis not present

## 2018-11-09 DIAGNOSIS — E1140 Type 2 diabetes mellitus with diabetic neuropathy, unspecified: Secondary | ICD-10-CM | POA: Diagnosis not present

## 2018-11-09 DIAGNOSIS — Z8614 Personal history of Methicillin resistant Staphylococcus aureus infection: Secondary | ICD-10-CM | POA: Diagnosis not present

## 2018-11-09 DIAGNOSIS — L97822 Non-pressure chronic ulcer of other part of left lower leg with fat layer exposed: Secondary | ICD-10-CM | POA: Diagnosis not present

## 2018-11-09 DIAGNOSIS — L97212 Non-pressure chronic ulcer of right calf with fat layer exposed: Secondary | ICD-10-CM | POA: Diagnosis not present

## 2018-11-09 DIAGNOSIS — L97222 Non-pressure chronic ulcer of left calf with fat layer exposed: Secondary | ICD-10-CM | POA: Diagnosis not present

## 2018-11-09 DIAGNOSIS — L03116 Cellulitis of left lower limb: Secondary | ICD-10-CM | POA: Diagnosis not present

## 2018-11-09 MED ORDER — PENICILLIN G BENZATHINE 1200000 UNIT/2ML IM SUSP
600000.0000 [IU] | Freq: Once | INTRAMUSCULAR | Status: AC
  Administered 2018-11-09: 600000 [IU] via INTRAMUSCULAR

## 2018-11-09 NOTE — Progress Notes (Signed)
Pt presents for monthly Bicillin LA injections.  Bicillin 600,000 units/ml (x2) administered in each upper outer quadrant. Patient tolerated injections well.  Patient scheduled for F/U with PCP 12/08/2018.   Weldon Picking, RN

## 2018-11-10 ENCOUNTER — Ambulatory Visit: Payer: Medicare Other

## 2018-11-10 ENCOUNTER — Other Ambulatory Visit: Payer: Self-pay | Admitting: Family Medicine

## 2018-11-13 DIAGNOSIS — L97212 Non-pressure chronic ulcer of right calf with fat layer exposed: Secondary | ICD-10-CM | POA: Diagnosis not present

## 2018-11-13 DIAGNOSIS — L97222 Non-pressure chronic ulcer of left calf with fat layer exposed: Secondary | ICD-10-CM | POA: Diagnosis not present

## 2018-11-13 DIAGNOSIS — L97812 Non-pressure chronic ulcer of other part of right lower leg with fat layer exposed: Secondary | ICD-10-CM | POA: Diagnosis not present

## 2018-11-13 DIAGNOSIS — E1140 Type 2 diabetes mellitus with diabetic neuropathy, unspecified: Secondary | ICD-10-CM | POA: Diagnosis not present

## 2018-11-13 DIAGNOSIS — L97529 Non-pressure chronic ulcer of other part of left foot with unspecified severity: Secondary | ICD-10-CM | POA: Diagnosis not present

## 2018-11-13 DIAGNOSIS — Z8614 Personal history of Methicillin resistant Staphylococcus aureus infection: Secondary | ICD-10-CM | POA: Diagnosis not present

## 2018-11-13 DIAGNOSIS — L97822 Non-pressure chronic ulcer of other part of left lower leg with fat layer exposed: Secondary | ICD-10-CM | POA: Diagnosis not present

## 2018-11-13 DIAGNOSIS — L03116 Cellulitis of left lower limb: Secondary | ICD-10-CM | POA: Diagnosis not present

## 2018-11-13 DIAGNOSIS — I890 Lymphedema, not elsewhere classified: Secondary | ICD-10-CM | POA: Diagnosis not present

## 2018-11-13 DIAGNOSIS — Z794 Long term (current) use of insulin: Secondary | ICD-10-CM | POA: Diagnosis not present

## 2018-11-13 DIAGNOSIS — E11622 Type 2 diabetes mellitus with other skin ulcer: Secondary | ICD-10-CM | POA: Diagnosis not present

## 2018-11-13 DIAGNOSIS — I10 Essential (primary) hypertension: Secondary | ICD-10-CM | POA: Diagnosis not present

## 2018-11-13 DIAGNOSIS — E11621 Type 2 diabetes mellitus with foot ulcer: Secondary | ICD-10-CM | POA: Diagnosis not present

## 2018-11-16 DIAGNOSIS — E1140 Type 2 diabetes mellitus with diabetic neuropathy, unspecified: Secondary | ICD-10-CM | POA: Diagnosis not present

## 2018-11-16 DIAGNOSIS — L97822 Non-pressure chronic ulcer of other part of left lower leg with fat layer exposed: Secondary | ICD-10-CM | POA: Diagnosis not present

## 2018-11-16 DIAGNOSIS — I10 Essential (primary) hypertension: Secondary | ICD-10-CM | POA: Diagnosis not present

## 2018-11-16 DIAGNOSIS — I890 Lymphedema, not elsewhere classified: Secondary | ICD-10-CM | POA: Diagnosis not present

## 2018-11-16 DIAGNOSIS — L97222 Non-pressure chronic ulcer of left calf with fat layer exposed: Secondary | ICD-10-CM | POA: Diagnosis not present

## 2018-11-16 DIAGNOSIS — Z794 Long term (current) use of insulin: Secondary | ICD-10-CM | POA: Diagnosis not present

## 2018-11-16 DIAGNOSIS — L97212 Non-pressure chronic ulcer of right calf with fat layer exposed: Secondary | ICD-10-CM | POA: Diagnosis not present

## 2018-11-16 DIAGNOSIS — E11621 Type 2 diabetes mellitus with foot ulcer: Secondary | ICD-10-CM | POA: Diagnosis not present

## 2018-11-16 DIAGNOSIS — L97529 Non-pressure chronic ulcer of other part of left foot with unspecified severity: Secondary | ICD-10-CM | POA: Diagnosis not present

## 2018-11-16 DIAGNOSIS — L97229 Non-pressure chronic ulcer of left calf with unspecified severity: Secondary | ICD-10-CM | POA: Diagnosis not present

## 2018-11-16 DIAGNOSIS — L97219 Non-pressure chronic ulcer of right calf with unspecified severity: Secondary | ICD-10-CM | POA: Diagnosis not present

## 2018-11-16 DIAGNOSIS — E11622 Type 2 diabetes mellitus with other skin ulcer: Secondary | ICD-10-CM | POA: Diagnosis not present

## 2018-11-16 DIAGNOSIS — L97812 Non-pressure chronic ulcer of other part of right lower leg with fat layer exposed: Secondary | ICD-10-CM | POA: Diagnosis not present

## 2018-11-16 DIAGNOSIS — Z8614 Personal history of Methicillin resistant Staphylococcus aureus infection: Secondary | ICD-10-CM | POA: Diagnosis not present

## 2018-11-16 DIAGNOSIS — L03116 Cellulitis of left lower limb: Secondary | ICD-10-CM | POA: Diagnosis not present

## 2018-11-25 ENCOUNTER — Other Ambulatory Visit: Payer: Self-pay | Admitting: Family Medicine

## 2018-11-25 DIAGNOSIS — E11622 Type 2 diabetes mellitus with other skin ulcer: Secondary | ICD-10-CM | POA: Diagnosis not present

## 2018-11-25 DIAGNOSIS — S81811A Laceration without foreign body, right lower leg, initial encounter: Secondary | ICD-10-CM | POA: Diagnosis not present

## 2018-11-25 DIAGNOSIS — L97529 Non-pressure chronic ulcer of other part of left foot with unspecified severity: Secondary | ICD-10-CM | POA: Diagnosis not present

## 2018-11-25 DIAGNOSIS — L97212 Non-pressure chronic ulcer of right calf with fat layer exposed: Secondary | ICD-10-CM | POA: Diagnosis not present

## 2018-11-25 DIAGNOSIS — E11621 Type 2 diabetes mellitus with foot ulcer: Secondary | ICD-10-CM | POA: Diagnosis not present

## 2018-11-25 DIAGNOSIS — L97222 Non-pressure chronic ulcer of left calf with fat layer exposed: Secondary | ICD-10-CM | POA: Diagnosis not present

## 2018-11-25 DIAGNOSIS — I890 Lymphedema, not elsewhere classified: Secondary | ICD-10-CM | POA: Diagnosis not present

## 2018-11-25 DIAGNOSIS — L97822 Non-pressure chronic ulcer of other part of left lower leg with fat layer exposed: Secondary | ICD-10-CM | POA: Diagnosis not present

## 2018-11-25 DIAGNOSIS — L97812 Non-pressure chronic ulcer of other part of right lower leg with fat layer exposed: Secondary | ICD-10-CM | POA: Diagnosis not present

## 2018-11-25 DIAGNOSIS — E1140 Type 2 diabetes mellitus with diabetic neuropathy, unspecified: Secondary | ICD-10-CM | POA: Diagnosis not present

## 2018-11-25 DIAGNOSIS — L03116 Cellulitis of left lower limb: Secondary | ICD-10-CM | POA: Diagnosis not present

## 2018-11-25 DIAGNOSIS — Z8614 Personal history of Methicillin resistant Staphylococcus aureus infection: Secondary | ICD-10-CM | POA: Diagnosis not present

## 2018-11-25 DIAGNOSIS — Z794 Long term (current) use of insulin: Secondary | ICD-10-CM | POA: Diagnosis not present

## 2018-11-25 DIAGNOSIS — I10 Essential (primary) hypertension: Secondary | ICD-10-CM | POA: Diagnosis not present

## 2018-11-25 MED ORDER — DILTIAZEM HCL ER BEADS 240 MG PO CP24: 240.0000 mg | ORAL_CAPSULE | Freq: Every day | ORAL | 0 refills | Status: DC

## 2018-11-25 NOTE — Telephone Encounter (Signed)
Copied from CRM [id number]. Topic: Quick Communication - Rx Refill/Question >> Nov 25, 2018  9:50 AM Zada Girt, Lumin L wrote: Medication: diltiazem (TIAZAC) 240 MG 24 hr capsule  (almost out, and [facility] has old scrip of 360 mg)  Has the patient contacted their pharmacy? Yes.   (Agent: If no, request that the patient contact the pharmacy for the refill.) (Agent: If yes, when and what did the pharmacy advise?)  Preferred Pharmacy (with phone number or street name): [facility] - [name], [place] - [address] [place] [address] [place] [address] Phone: [phone] Fax: [phone]  Agent: Please be advised that RX refills may take up to 3 business days. We ask that you follow-up with your pharmacy.

## 2018-11-25 NOTE — Telephone Encounter (Signed)
Requested medication (s) are due for refill today -yes  Requested medication (s) are on the active medication list -yes  Future visit scheduled -yes  Last refill: 10/23/18  Notes to clinic: Patient was put on reduced dosing per PC note 10/23/18. Patient has OV scheduled 12/08/18. Patient is requesting refill of medication not assigned to protocol- sent for provider review to continue until next appointment  Requested Prescriptions  Pending Prescriptions Disp Refills   diltiazem (TIAZAC) 240 MG 24 hr capsule 30 capsule 0    Sig: Take 1 capsule (240 mg total) by mouth daily.     Off-Protocol Failed - 11/25/2018  9:59 AM      Failed - Medication not assigned to a protocol, review manually.      Passed - Valid encounter within last 12 months    Recent Outpatient Visits          2 months ago Type 2 diabetes with complication (HCC)   [place] [facility], Maryjean Morn, MD   3 months ago Acute bronchitis, unspecified organism   [place] [facility], Maryjean Morn, MD   4 months ago Decreased vision of left eye   [place] [facility], Maryjean Morn, MD   5 months ago Type 2 diabetes mellitus with complication, with long-term current use of insulin (HCC)   [place] [facility], Maryjean Morn, MD   8 months ago Health maintenance examination   [place] [facility], Maryjean Morn, MD      Future Appointments            In 1 week McGowen, Maryjean Morn, MD [place] [facility], [facility]   In 2 weeks Delacroix, Lisette Abu, MD [facility] [place], [place]   In 5 months [name], Gomez Cleverly, NP [facility], [facility]            Requested Prescriptions  Pending Prescriptions Disp Refills   diltiazem (TIAZAC) 240 MG 24 hr capsule 30 capsule 0    Sig: Take 1 capsule (240 mg total) by mouth daily.     Off-Protocol Failed - 11/25/2018  9:59 AM      Failed - Medication not  assigned to a protocol, review manually.      Passed - Valid encounter within last 12 months    Recent Outpatient Visits          2 months ago Type 2 diabetes with complication (HCC)   [place] [facility], Maryjean Morn, MD   3 months ago Acute bronchitis, unspecified organism   [place] [facility], Maryjean Morn, MD   4 months ago Decreased vision of left eye   [place] [facility], Maryjean Morn, MD   5 months ago Type 2 diabetes mellitus with complication, with long-term current use of insulin (HCC)   [place] [facility] Maryjean Morn, MD   8 months ago Health maintenance examination   [place] [facility], Maryjean Morn, MD      Future Appointments            In 1 week McGowen, Maryjean Morn, MD [place] [facility], [facility]   In 2 weeks Hilty, Lisette Abu, MD [facility]   In 5 months [name], Gomez Cleverly, NP [facility]  for [facility], [facility]

## 2018-11-26 ENCOUNTER — Encounter: Payer: Self-pay | Admitting: Internal Medicine

## 2018-11-26 ENCOUNTER — Ambulatory Visit: Payer: Medicare Other | Admitting: Internal Medicine

## 2018-11-26 VITALS — BP 122/80 | HR 44 | Ht 70.0 in | Wt 380.0 lb

## 2018-11-26 DIAGNOSIS — I5032 Chronic diastolic (congestive) heart failure: Secondary | ICD-10-CM | POA: Diagnosis not present

## 2018-11-26 DIAGNOSIS — E119 Type 2 diabetes mellitus without complications: Secondary | ICD-10-CM | POA: Diagnosis not present

## 2018-11-26 DIAGNOSIS — G4733 Obstructive sleep apnea (adult) (pediatric): Secondary | ICD-10-CM | POA: Diagnosis not present

## 2018-11-26 DIAGNOSIS — I4819 Other persistent atrial fibrillation: Secondary | ICD-10-CM

## 2018-11-26 DIAGNOSIS — I4891 Unspecified atrial fibrillation: Secondary | ICD-10-CM | POA: Diagnosis not present

## 2018-11-26 DIAGNOSIS — R600 Localized edema: Secondary | ICD-10-CM | POA: Diagnosis not present

## 2018-11-26 DIAGNOSIS — Z794 Long term (current) use of insulin: Secondary | ICD-10-CM | POA: Diagnosis not present

## 2018-11-26 NOTE — Patient Instructions (Signed)
Medication Instructions:  DECREASE metoprolol to 25mg  twice daily -- half of a 50mg  tablet twice a day If you need a refill on your cardiac medications before your next appointment, please call your pharmacy.   Follow-Up: At [facility], you and your health needs are our priority.  As part of our continuing mission to provide you with exceptional heart care, we have created designated Provider Care Teams.  These Care Teams include your primary Cardiologist (physician) and Advanced Practice Providers (APPs -  Physician Assistants and Nurse Practitioners) who all work together to provide you with the care you need, when you need it. You will need a follow up appointment in 6 months.  Please call our office 2 months in advance to schedule this appointment.  You may see Dr. Rennis GoldenHilty or one of the following Advanced Practice Providers on your designated Care Team: Azalee CourseHao Meng, [place] JerseyPA-C . Micah FlesherAngela Duke, PA-C  Any Other Special Instructions Will Be Listed Below (If Applicable).

## 2018-11-26 NOTE — Progress Notes (Signed)
OFFICE NOTE  Chief Complaint:  No complaints  Primary Care Physician: Jeoffrey MassedMcGowen, [name] H, MD  HPI:  [name] is a 68 year old male who is currently referred by Dr. Milinda CaveMcGowen for new onset atrial flutter/fibrillation. [name] was at his office for a routine physical and underwent an EKG. This demonstrated either coarse A. Fib or atrial flutter with variable ventricular response and a rate around 80. It was noted that he was already on metoprolol tartrate 75 mg twice daily for hypertension. 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  He is reportedly unaware of his atrial fibrillation. Fortunately an EKG in the office today shows that he is back in a sinus rhythm or sinus arrhythmia. There is no family history of coronary disease or A. Fib. He denies any chest pain or worsening shortness of breath, but has been having problems with sciatic pain in his back and difficulty ambulating. He reports he gets good sleep at night, rarely wakes up, does not have morning headaches or the need for daytime napping. His screening EPWSS was only 3, suggesting that obstructive sleep apnea is actually fairly unlikely, despite his significant weight.  [name] returns today for follow-up. He underwent a nuclear stress test which was a 2 day study. This demonstrated significant TID 1.48, however it is unclear how reliable this could be because of the fact the study was done on 2 different days. The test was negative for ischemia. EF was calculated at 42%, which is lower than his EF was thought to be at 50%. He denies any chest pain, ever. He also has stable shortness of breath but no worsening symptoms. He reportedly was unaware of A. fib and still has not had any symptoms. He is currently on Xarelto and is not having any bleeding problems.  [name] returns today for follow-up. He reports some  recent worsening of shortness of breath on exertion. He saw Dr. Milinda CaveMcGowen for this and had some increase in his Lasix. Up to 80 mg daily. He is currently taking 40 mg daily does not notice significant difference in his symptoms. He had a recent echocardiogram which shows an improvement in LV function up to 55-60%. He feels some of this is due to excess weight. He's been less active. He is now close to 400 pounds. Has pain. He has recurrent atrial fibrillation and is in A. fib today to rate is 78. This is been paroxysmal in the past however may be more persistent. He is not aware of his A. fib. He is on Xarelto and is been compliant with that medication. He denies any bleeding problems.   12/24/2016  [name] returns to for follow-up. Initially blood pressure was elevated 166/60 became and 132/64. He has managed to lose about 15 pounds which I commended him on. He denies any chest pain or worsening shortness of breath. He remains in atrial fibrillation with controlled ventricular response of 61. He denies any bleeding problems on Xarelto.  12/15/2017  [name] returns today for annual follow-up.  He has been struggling with lower extremity wound infections.  He is undergoing penicillin injections and wound care management.  He denies any chest pain or worsening shortness of breath however has had about a 12 pound weight gain since I last saw him a year ago.  He says he is going to be working on dietary changes however he is noted  significant swelling of his legs.  There was a suggestion that he may be volume overloaded and he does report some symptoms that sound consistent with orthopnea.  He last had an echo which showed systolic function and 2017.  01/16/2018  [name] was seen today in follow-up.  He reports improvement in his edema.  I increased his Lasix to 80 mg twice daily.  He is lost about 4 pounds since I last saw him.  He denies any worsening shortness of breath.  He continues to struggle with  lower extremity wounds that are slowly healing.  Weight is a significant issue.  An echo was performed which showed normal systolic function and mild RV dysfunction.  Most of his symptoms are likely attributable to that.  11/26/2018  [name] is seen today in follow-up.  Overall he is doing well.  Recently he is lost significant amount of weight.  He was seen by the wound care center who noted that he was put tensive and had a low heart rate.  His diltiazem was decreased from 360 mg to 240 mg daily and his metoprolol was decreased from 75 mg twice daily to 50 mg twice daily.  Today's blood pressure is improved however heart rate remains low at 44.  He seems to be asymptomatic with this.  Denies any worsening shortness of breath or chest pain.  PMHx:  Past Medical History:  Diagnosis Date  . ALLERGIC RHINITIS 08/11/2006  . ASTHMA 08/11/2006  . Chronic atrial fibrillation 08/2014  . Chronic combined systolic and diastolic CHF (congestive heart failure) [facility])    Cardiology f/u 12/2017: pt volume overloaded (R heart dysf suspected), BNP very high, lasix increased.  Repeat echo 12/2017: normal LV EF, mild DD, +RV syst dysfxn, mod pulm HTN, biatrial enlgmt.  . Chronic constipation   . Chronic renal insufficiency, stage 2 (mild)    Borderline stage III (GFR 60s).  . DIABETES MELLITUS, TYPE II 08/11/2006  . DM W/RENAL MANIFESTATIONS, TYPE II 04/20/2007  . HYPERTENSION 08/11/2006  . MRSA infection 06/2018   LL venous stasis ulcer infected  . Normocytic anemia 2016-2019   03/2018 B12 normal, iron ok (ferritin borderline low).  Plan repeat CBC at f/u summer 2019 and if decreased from baseline will check hemoccults and iron labs again.  . OBESITY, MORBID 12/14/2007  . 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  [id number] ID started him on amoxil prophylaxis---ineffective.  End 2018/Jan 2019 penicillin G injections prophyl helpful but pt declined to  continue this as of 01/2018 ID f/u.  ID talked him into resuming monthly penicillin G as of 02/2018 f/u.  Marland Kitchen Restless leg syndrome    Rx'd clonazepam 09/2017 and pt refused to take it after reading the medication's potential side effects.  . Venous stasis ulcers of both lower extremities (HCC)    wound clinic care ongoing as of 01/2018    Past Surgical History:  Procedure Laterality Date  . Carotid dopplers  07/23/2018   Left NORMAL.  Right 1-39% ICA stenosis, with <50% distal CCA stenosis (not hemodynamically significant)  . TRANSTHORACIC ECHOCARDIOGRAM  11/2007; 09/2014; 11/2015;12/2017   LV fxn normal, EF normal, mild dilation of left atrium.  2015 grade II diast dysfxn.  2017 EF 55-60%. 2019: LVEF 60-65%, mild RV syst dysf,biatrial enlgmt, mod pulm htn.  Marland Kitchen URETERAL STENT PLACEMENT    . virtual colonoscopy  01/2011   Normal    FAMHx:  Family History  Problem Relation Age of Onset  . Diabetes Mother   . Diabetes Sister   . Hydrocephalus Sister        NPH    SOCHx:   reports that he has never smoked. He has never used smokeless tobacco. He reports that he does not drink alcohol or use drugs.  ALLERGIES:  No Known Allergies  ROS: Pertinent items noted in HPI and remainder of comprehensive ROS otherwise negative.  HOME MEDS: Current Outpatient Medications  Medication Sig Dispense Refill  . b complex vitamins capsule Take 1 capsule by mouth every morning.    . butalbital-acetaminophen-caffeine (FIORICET WITH CODEINE) 50-325-40-30 MG capsule TAKE 1 TO 2 CAPSULES BY  MOUTH EVERY 6 HOURS AS  NEEDED FOR HEADACHE(S)  MANUFACTURER RECOMMENDS NOT EXCEEDING 6 CAPSULES/DAY 60 capsule 1  . cetirizine (ZYRTEC) 10 MG tablet Take 10 mg by mouth daily.    . Cinnamon 500 MG TABS Take by mouth. Reported on 02/19/2016    . Coenzyme Q10 (CO Q 10) 10 MG CAPS Take by mouth. Reported on 02/19/2016    . diltiazem (TIAZAC) 240 MG 24 hr capsule Take 1 capsule (240 mg total) by mouth daily. 30 capsule 0  .  furosemide (LASIX) 40 MG tablet TAKE 2 TABLETS BY MOUTH TWO TIMES DAILY 360 tablet 1  . glucose blood (ONE TOUCH ULTRA TEST) test strip CHECK BLOOD SUGAR TWO TIMES DAILY 100 each 11  . HUMALOG MIX 75/25 KWIKPEN (75-25) 100 UNIT/ML Kwikpen INJECT SUBCUTANEOUSLY 25  UNITS EVERY MORNING AND 20  UNITS EVERY EVENING AT  SUPPER 45 mL 1  . HYDROcodone-acetaminophen (NORCO/VICODIN) 5-325 MG tablet 1-2 tabs po bid prn pain 60 tablet 0  . Insulin Pen Needle (BD ULTRA-FINE PEN NEEDLES) 29G X 12.7MM MISC 1 each by Other route 2 (two) times daily. 200 each 2  . Insulin Pen Needle (BD ULTRA-FINE PEN NEEDLES) 29G X 12.7MM MISC USE TWICE DAILY 200 each 3  . Insulin Pen Needle (RELION PEN NEEDLE 31G/8MM) 31G X 8 MM MISC 1 each by Does not apply route 2 (two) times daily.    Marland Kitchen. lisinopril (PRINIVIL,ZESTRIL) 40 MG tablet TAKE 1 TABLET BY MOUTH  EVERY DAY 90 tablet 1  . metFORMIN (GLUCOPHAGE) 1000 MG tablet TAKE 1 TABLET BY MOUTH  TWICE A DAY WITH MEALS 180 tablet 1  . metoprolol tartrate (LOPRESSOR) 50 MG tablet TAKE 1 AND 1/2 TABLETS BY  MOUTH TWO TIMES DAILY 270 tablet 1  . Multiple Vitamin (MULTIVITAMIN) tablet Take 1 tablet by mouth daily.    . Nutritional Supplements (GRAPESEED EXTRACT PO) Take 1 capsule by mouth daily.    . saw palmetto 500 MG capsule Take 500 mg by mouth daily.    Marland Kitchen. terazosin (HYTRIN) 10 MG capsule TAKE 1 CAPSULE BY MOUTH  ONCE A DAY AT BEDTIME 90 capsule 1  . vitamin E 400 UNIT capsule Take 400 Units by mouth every morning. Selenium 50mg     . XARELTO 20 MG TABS tablet TAKE 1 TABLET BY MOUTH ONCE DAILY WITH  SUPPER 90 tablet 1   Current Facility-Administered Medications  Medication Dose Route Frequency Provider Last Rate Last Dose  . penicillin g benzathine (BICILLIN LA) 1200000 UNIT/2ML injection 600,000 Units  600,000 Units Intramuscular Q30 days Jeoffrey MassedMcGowen, [name] H, MD   600,000 Units at 10/13/18 1116  . penicillin g benzathine (BICILLIN LA) 1200000 UNIT/2ML injection 600,000 Units  600,000  Units Intramuscular Q30 days Jeoffrey MassedMcGowen, [name] H, MD   600,000 Units at 10/13/18 1115  . penicillin  g benzathine (BICILLIN LA) 1200000 UNIT/2ML injection 600,000 Units  600,000 Units Intramuscular Q30 days McGowen, Maryjean MornPhilip H, MD      . penicillin g benzathine (BICILLIN LA) 1200000 UNIT/2ML injection 600,000 Units  600,000 Units Intramuscular Q30 days McGowen, Maryjean MornPhilip H, MD        LABS/IMAGING: No results found for this or any previous visit (from the past 48 hour(s)). No results found.  VITALS: BP 122/80   Pulse (!) 44   Ht 5\' 10"  (1.778 m)   Wt (!) 380 lb (172.4 kg)   BMI 54.52 kg/m   EXAM: General appearance: alert, no distress and morbidly obese Neck: no carotid bruit, no JVD and thyroid not enlarged, symmetric, no tenderness/mass/nodules Lungs: clear to auscultation bilaterally Heart: regular rate and rhythm, S1, S2 normal, no murmur, click, rub or gallop Abdomen: soft, non-tender; bowel sounds normal; no masses,  no organomegaly Extremities: edema 2+ bilateral edema, the legs are wrapped Pulses: 2+ and symmetric Skin: Skin color, texture, turgor normal. No rashes or lesions Neurologic: Alert and oriented X 3, normal strength and tone. Normal symmetric reflexes. Normal coordination and gait Psych: Pleasant  EKG: A. fib with slow ventricular response of 44-personally reviewed  ASSESSMENT: 1. Slow A. fib 2. Acute on chronic combined systolic and diastolic heart failure (LVEF 55-60% in 11/2015) 3. PAF-CHADSVASC score of 3 4. Hypertension 5. Morbid obesity 6. 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 Dyslipidemia  PLAN: 1.   [name] has slow atrial fibrillation today and recently has had hypotension.  His medications have been reduced and I suspect this may be related to recent weight loss.  I would like to further reduce his metoprolol to 25 mg twice daily to help with his heart rate.  He seems to be asymptomatic with this.  It is difficult to determine  his volume status but does not seem volume overloaded today.  He denies any worsening shortness of breath or orthopnea.  He still undergoing care at the wound center but improving.  I encouraged him to continue with the weight loss.  Plan to see him back in 6 months or sooner as necessary.  Chrystie NoseKenneth C. Hilty, MD, [facility] ArizonaFACC, FACP  [place]  [facility]  Medical Director of the Advanced Lipid Disorders &  Cardiovascular Risk Reduction Clinic Diplomate of the American Board of Clinical Lipidology Attending Cardiologist  Direct Dial: [phone]  Fax: [phone]  Website:  [email or web address]  Kenneth C Hilty 11/26/2018, 3:49 PM

## 2018-11-30 NOTE — Progress Notes (Signed)
Noted and agree. Signed:  Phil McGowen, MD           11/30/2018  

## 2018-11-30 NOTE — Progress Notes (Signed)
Noted and agree. Signed:  Santiago BumpersPhil McGowen, MD           11/30/2018

## 2018-12-01 DIAGNOSIS — L03116 Cellulitis of left lower limb: Secondary | ICD-10-CM | POA: Diagnosis not present

## 2018-12-01 DIAGNOSIS — E11621 Type 2 diabetes mellitus with foot ulcer: Secondary | ICD-10-CM | POA: Diagnosis not present

## 2018-12-01 DIAGNOSIS — E1140 Type 2 diabetes mellitus with diabetic neuropathy, unspecified: Secondary | ICD-10-CM | POA: Diagnosis not present

## 2018-12-01 DIAGNOSIS — Z794 Long term (current) use of insulin: Secondary | ICD-10-CM | POA: Diagnosis not present

## 2018-12-01 DIAGNOSIS — Z8614 Personal history of Methicillin resistant Staphylococcus aureus infection: Secondary | ICD-10-CM | POA: Diagnosis not present

## 2018-12-01 DIAGNOSIS — L97822 Non-pressure chronic ulcer of other part of left lower leg with fat layer exposed: Secondary | ICD-10-CM | POA: Diagnosis not present

## 2018-12-01 DIAGNOSIS — I10 Essential (primary) hypertension: Secondary | ICD-10-CM | POA: Diagnosis not present

## 2018-12-01 DIAGNOSIS — L97812 Non-pressure chronic ulcer of other part of right lower leg with fat layer exposed: Secondary | ICD-10-CM | POA: Diagnosis not present

## 2018-12-01 DIAGNOSIS — L97212 Non-pressure chronic ulcer of right calf with fat layer exposed: Secondary | ICD-10-CM | POA: Diagnosis not present

## 2018-12-01 DIAGNOSIS — I890 Lymphedema, not elsewhere classified: Secondary | ICD-10-CM | POA: Diagnosis not present

## 2018-12-01 DIAGNOSIS — L97821 Non-pressure chronic ulcer of other part of left lower leg limited to breakdown of skin: Secondary | ICD-10-CM | POA: Diagnosis not present

## 2018-12-01 DIAGNOSIS — L97222 Non-pressure chronic ulcer of left calf with fat layer exposed: Secondary | ICD-10-CM | POA: Diagnosis not present

## 2018-12-01 DIAGNOSIS — E11622 Type 2 diabetes mellitus with other skin ulcer: Secondary | ICD-10-CM | POA: Diagnosis not present

## 2018-12-01 DIAGNOSIS — L97529 Non-pressure chronic ulcer of other part of left foot with unspecified severity: Secondary | ICD-10-CM | POA: Diagnosis not present

## 2018-12-03 ENCOUNTER — Encounter: Payer: Self-pay | Admitting: Family Medicine

## 2018-12-03 DIAGNOSIS — I5032 Chronic diastolic (congestive) heart failure: Secondary | ICD-10-CM | POA: Diagnosis not present

## 2018-12-03 DIAGNOSIS — Z9189 Other specified personal risk factors, not elsewhere classified: Secondary | ICD-10-CM | POA: Diagnosis not present

## 2018-12-03 DIAGNOSIS — I272 Pulmonary hypertension, unspecified: Secondary | ICD-10-CM | POA: Diagnosis not present

## 2018-12-03 DIAGNOSIS — I110 Hypertensive heart disease with heart failure: Secondary | ICD-10-CM | POA: Diagnosis not present

## 2018-12-03 DIAGNOSIS — G4723 Circadian rhythm sleep disorder, irregular sleep wake type: Secondary | ICD-10-CM | POA: Diagnosis not present

## 2018-12-07 ENCOUNTER — Encounter: Payer: Self-pay | Admitting: *Deleted

## 2018-12-08 ENCOUNTER — Encounter: Payer: Self-pay | Admitting: Family Medicine

## 2018-12-08 ENCOUNTER — Encounter: Payer: Self-pay | Admitting: *Deleted

## 2018-12-08 ENCOUNTER — Encounter (HOSPITAL_BASED_OUTPATIENT_CLINIC_OR_DEPARTMENT_OTHER): Payer: Medicare Other | Attending: Internal Medicine

## 2018-12-08 ENCOUNTER — Ambulatory Visit (INDEPENDENT_AMBULATORY_CARE_PROVIDER_SITE_OTHER): Payer: Medicare Other | Admitting: Family Medicine

## 2018-12-08 VITALS — BP 152/60 | HR 54 | Temp 97.4°F | Resp 16 | Ht 70.0 in | Wt 372.2 lb

## 2018-12-08 DIAGNOSIS — I10 Essential (primary) hypertension: Secondary | ICD-10-CM | POA: Insufficient documentation

## 2018-12-08 DIAGNOSIS — L03116 Cellulitis of left lower limb: Secondary | ICD-10-CM

## 2018-12-08 DIAGNOSIS — E11622 Type 2 diabetes mellitus with other skin ulcer: Secondary | ICD-10-CM | POA: Diagnosis not present

## 2018-12-08 DIAGNOSIS — L97822 Non-pressure chronic ulcer of other part of left lower leg with fat layer exposed: Secondary | ICD-10-CM | POA: Diagnosis not present

## 2018-12-08 DIAGNOSIS — L97811 Non-pressure chronic ulcer of other part of right lower leg limited to breakdown of skin: Secondary | ICD-10-CM | POA: Diagnosis not present

## 2018-12-08 DIAGNOSIS — N183 Chronic kidney disease, stage 3 unspecified: Secondary | ICD-10-CM

## 2018-12-08 DIAGNOSIS — E118 Type 2 diabetes mellitus with unspecified complications: Secondary | ICD-10-CM | POA: Diagnosis not present

## 2018-12-08 DIAGNOSIS — I87333 Chronic venous hypertension (idiopathic) 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: Secondary | ICD-10-CM | POA: Insufficient documentation

## 2018-12-08 DIAGNOSIS — I890 Lymphedema, not elsewhere classified: Secondary | ICD-10-CM | POA: Diagnosis not present

## 2018-12-08 DIAGNOSIS — S91302A Unspecified open wound, left foot, initial encounter: Secondary | ICD-10-CM | POA: Diagnosis not present

## 2018-12-08 DIAGNOSIS — E1151 Type 2 diabetes mellitus with diabetic peripheral angiopathy without gangrene: Secondary | ICD-10-CM | POA: Insufficient documentation

## 2018-12-08 DIAGNOSIS — L97522 Non-pressure chronic ulcer of other part of left foot with fat layer exposed: Secondary | ICD-10-CM | POA: Insufficient documentation

## 2018-12-08 DIAGNOSIS — L97529 Non-pressure chronic ulcer of other part of left foot with unspecified severity: Secondary | ICD-10-CM | POA: Diagnosis not present

## 2018-12-08 DIAGNOSIS — L97821 Non-pressure chronic ulcer of other part of left lower leg limited to breakdown of skin: Secondary | ICD-10-CM | POA: Insufficient documentation

## 2018-12-08 DIAGNOSIS — S81802A Unspecified open wound, left lower leg, initial encounter: Secondary | ICD-10-CM | POA: Diagnosis not present

## 2018-12-08 DIAGNOSIS — S81801A Unspecified open wound, right lower leg, initial encounter: Secondary | ICD-10-CM | POA: Diagnosis not present

## 2018-12-08 LAB — BASIC METABOLIC PANEL
BUN: 17 mg/dL (ref 6–23)
CHLORIDE: 103 meq/L (ref 96–112)
CO2: 29 mEq/L (ref 19–32)
Calcium: 8.9 mg/dL (ref 8.4–10.5)
Creatinine, Ser: 1.26 mg/dL (ref 0.40–1.50)
GFR: 56.97 mL/min — ABNORMAL LOW (ref >60.00)
Glucose, Bld: 131 mg/dL — ABNORMAL HIGH (ref 70–99)
Potassium: 4.6 mEq/L (ref 3.5–5.1)
Sodium: 140 mEq/L (ref 135–145)

## 2018-12-08 LAB — HEMOGLOBIN A1C: Hgb A1c MFr Bld: 6.5 % (ref 4.6–6.5)

## 2018-12-08 MED ORDER — DILTIAZEM HCL ER BEADS 240 MG PO CP24: 240.0000 mg | ORAL_CAPSULE | Freq: Every day | ORAL | 1 refills | Status: DC

## 2018-12-08 MED ORDER — HYDROCODONE-ACETAMINOPHEN 5-325 MG PO TABS: ORAL_TABLET | ORAL | 0 refills | Status: DC

## 2018-12-08 NOTE — Progress Notes (Signed)
OFFICE VISIT  12/08/2018   CC:  Chief Complaint  Patient presents with  . Follow-up    [name], pt is fasting.    HPI:    Patient is a 68 y.o. Caucasian male who presents for 3 mo f/u DM 2, HTN, chronic a-fib,CRI with GFR@60  ml/min,and severe lymphedema of both LL's with recurrent/chronic cellulitis of lower legs. Had cardiology f/u a couple of weeks ago and his lopressor was decreased to 25mg  bid.  This was after the wound clinic had already decreased his dose of lopressor and diltiazem for low bp and HR. Pt has been asymptomatic with this.  Interim Hx:  DM: humalog mix 75/25, also metformin.  Taking his insulin via sliding scale. Dose varies in morning: gluc 120-180.  If 180 then he gives himself 20 units.  HTN: home bp is not being monitored. We gave him rx for bp machine today.  CRI: avoiding NSAIDs.  Hydrates well.  LE edema/recurrent cellulitis (MRSA): wound clinic regularly.  Due for monthly penicillin injection here today. Says fluid in LLs is about the same as usual.  Chronically wearing compression wraps and uses pump at home.  Vicodin: uses this some nights for his LL pains and it helps him get some good sleep.  Using it sparingly.  ROS: no CP, no SOB, no wheezing, no cough, no dizziness, no HAs, no rashes, no melena/hematochezia.  No polyuria or polydipsia.  No myalgias or arthralgias.   Past Medical History:  Diagnosis Date  . ALLERGIC RHINITIS 08/11/2006  . ASTHMA 08/11/2006  . Chronic atrial fibrillation 08/2014  . Chronic combined systolic and diastolic CHF (congestive heart failure) [facility](HCC)    Cardiology f/u 12/2017: pt volume overloaded (R heart dysf suspected), BNP very high, lasix increased.  Repeat echo 12/2017: normal LV EF, mild DD, +RV syst dysfxn, mod pulm HTN, biatrial enlgmt.  . Chronic constipation   . Chronic renal insufficiency, stage 2 (mild)    Borderline stage III (GFR 60s).  . DIABETES MELLITUS, TYPE II 08/11/2006  . DM W/RENAL MANIFESTATIONS, TYPE  II 04/20/2007  . HYPERTENSION 08/11/2006  . MRSA infection 06/2018   LL venous stasis ulcer infected  . Normocytic anemia 2016-2019   03/2018 B12 normal, iron ok (ferritin borderline low).  Plan repeat CBC at f/u summer 2019 and if decreased from baseline will check hemoccults and iron labs again.  . OBESITY, MORBID 12/14/2007  . OSA (obstructive sleep apnea)    to get sleep study with [facility] as of 12/03/2018 consult.  . 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  [id number] ID started him on amoxil prophylaxis---ineffective.  End 2018/Jan 2019 penicillin G injections prophyl helpful but pt declined to continue this as of 01/2018 ID f/u.  ID talked him into resuming monthly penicillin G as of 02/2018 f/u.  Marland Kitchen. Restless leg syndrome    Rx'd clonazepam 09/2017 and pt refused to take it after reading the medication's potential side effects.  . Venous stasis ulcers of both lower extremities (HCC)    wound clinic care ongoing as of 01/2018    Past Surgical History:  Procedure Laterality Date  . Carotid dopplers  07/23/2018   Left NORMAL.  Right 1-39% ICA stenosis, with <50% distal CCA stenosis (not hemodynamically significant)  . TRANSTHORACIC ECHOCARDIOGRAM  11/2007; 09/2014; 11/2015;12/2017   LV fxn normal, EF normal, mild dilation of left atrium.  2015 grade II diast dysfxn.  2017 EF 55-60%. 2019: LVEF 60-65%,  mild RV syst dysf,biatrial enlgmt, mod pulm htn.  Marland Kitchen URETERAL STENT PLACEMENT    . virtual colonoscopy  01/2011   Normal    Outpatient Medications Prior to Visit  Medication Sig Dispense Refill  . b complex vitamins capsule Take 1 capsule by mouth every morning.    . butalbital-acetaminophen-caffeine (FIORICET WITH CODEINE) 50-325-40-30 MG capsule TAKE 1 TO 2 CAPSULES BY  MOUTH EVERY 6 HOURS AS  NEEDED FOR HEADACHE(S)  MANUFACTURER RECOMMENDS NOT EXCEEDING 6 CAPSULES/DAY 60 capsule 1  . cetirizine (ZYRTEC) 10 MG  tablet Take 10 mg by mouth daily.    . Cinnamon 500 MG TABS Take by mouth. Reported on 02/19/2016    . Coenzyme Q10 (CO Q 10) 10 MG CAPS Take by mouth. Reported on 02/19/2016    . furosemide (LASIX) 40 MG tablet TAKE 2 TABLETS BY MOUTH TWO TIMES DAILY 360 tablet 1  . glucose blood (ONE TOUCH ULTRA TEST) test strip CHECK BLOOD SUGAR TWO TIMES DAILY 100 each 11  . HUMALOG MIX 75/25 KWIKPEN (75-25) 100 UNIT/ML Kwikpen INJECT SUBCUTANEOUSLY 25  UNITS EVERY MORNING AND 20  UNITS EVERY EVENING AT  SUPPER 45 mL 1  . Insulin Pen Needle (BD ULTRA-FINE PEN NEEDLES) 29G X 12.7MM MISC 1 each by Other route 2 (two) times daily. 200 each 2  . Insulin Pen Needle (BD ULTRA-FINE PEN NEEDLES) 29G X 12.7MM MISC USE TWICE DAILY 200 each 3  . Insulin Pen Needle (RELION PEN NEEDLE 31G/8MM) 31G X 8 MM MISC 1 each by Does not apply route 2 (two) times daily.    Marland Kitchen lisinopril (PRINIVIL,ZESTRIL) 40 MG tablet TAKE 1 TABLET BY MOUTH  EVERY DAY 90 tablet 1  . metFORMIN (GLUCOPHAGE) 1000 MG tablet TAKE 1 TABLET BY MOUTH  TWICE A DAY WITH MEALS 180 tablet 1  . metoprolol tartrate (LOPRESSOR) 50 MG tablet Take 25 mg by mouth 2 (two) times daily.    . Multiple Vitamin (MULTIVITAMIN) tablet Take 1 tablet by mouth daily.    . Nutritional Supplements (GRAPESEED EXTRACT PO) Take 1 capsule by mouth daily.    . saw palmetto 500 MG capsule Take 500 mg by mouth daily.    Marland Kitchen terazosin (HYTRIN) 10 MG capsule TAKE 1 CAPSULE BY MOUTH  ONCE A DAY AT BEDTIME 90 capsule 1  . vitamin E 400 UNIT capsule Take 400 Units by mouth every morning. Selenium 50mg     . XARELTO 20 MG TABS tablet TAKE 1 TABLET BY MOUTH ONCE DAILY WITH  SUPPER 90 tablet 1  . diltiazem (TIAZAC) 240 MG 24 hr capsule Take 1 capsule (240 mg total) by mouth daily. 30 capsule 0  . HYDROcodone-acetaminophen (NORCO/VICODIN) 5-325 MG tablet 1-2 tabs po bid prn pain 60 tablet 0   Facility-Administered Medications Prior to Visit  Medication Dose Route Frequency Provider Last Rate Last  Dose  . penicillin g benzathine (BICILLIN LA) 1200000 UNIT/2ML injection 600,000 Units  600,000 Units Intramuscular Q30 days [name], MD   600,000 Units at 12/08/18 1211  . penicillin g benzathine (BICILLIN LA) 1200000 UNIT/2ML injection 600,000 Units  600,000 Units Intramuscular Q30 days [name], MD   600,000 Units at 12/08/18 1204  . penicillin g benzathine (BICILLIN LA) 1200000 UNIT/2ML injection 600,000 Units  600,000 Units Intramuscular Q30 days McGowen, Maryjean Morn, MD      . penicillin g benzathine (BICILLIN LA) 1200000 UNIT/2ML injection 600,000 Units  600,000 Units Intramuscular Q30 days McGowen, Maryjean Morn, MD  Allergies  Allergen Reactions  . Other Other (See Comments)    Sneezing, coughing    ROS As per HPI  PE: Blood pressure (!) 152/60, pulse (!) 54, temperature (!) 97.4 F (36.3 C), temperature source Oral, resp. rate 16, height 5\' 10"  (1.778 m), weight (!) 372 lb 4 oz (168.9 kg), SpO2 96 %. Body mass index is 53.41 kg/m.  Gen: Alert, well appearing.  Patient is oriented to person, place, time, and situation. AFFECT: pleasant, lucid thought and speech. No further exam today.  LABS:  Lab Results  Component Value Date   TSH 4.40 06/08/2018   Lab Results  Component Value Date   WBC 6.1 06/08/2018   HGB 12.0 (L) 06/08/2018   HCT 36.2 (L) 06/08/2018   MCV 81.1 06/08/2018   PLT 222.0 06/08/2018   Lab Results  Component Value Date   CREATININE 1.34 09/08/2018   BUN 21 09/08/2018   NA 136 09/08/2018   K 4.4 09/08/2018   CL 102 09/08/2018   CO2 24 09/08/2018   Lab Results  Component Value Date   ALT 10 03/09/2018   AST 14 03/09/2018   ALKPHOS 65 03/09/2018   BILITOT 1.3 (H) 03/09/2018   Lab Results  Component Value Date   CHOL 84 03/09/2018   Lab Results  Component Value Date   HDL 34.00 (L) 03/09/2018   Lab Results  Component Value Date   LDLCALC 42 03/09/2018   Lab Results  Component Value Date   TRIG 41.0 03/09/2018    Lab Results  Component Value Date   CHOLHDL 2 03/09/2018   Lab Results  Component Value Date   PSA 0.26 03/09/2018   PSA 0.30 12/09/2016   PSA 0.21 12/11/2015   Lab Results  Component Value Date   HGBA1C 6.8 (H) 09/08/2018    IMPRESSION AND PLAN:  1) DM 2: well controlled, with A1c <7% for the last 3 yrs. Pt inquired about getting on ozempic today.  Will consider getting him on this med in the future either with insulin or w/out insulin. (0.25mg  SQ q week x 4wks, then 0.5mg  SQ q week). HbA1c today as well as lytes/cr. No med changes today.  2) ZOX:WRUEHTN:some lows and some highs at various MD and PT visits, but consistently bradycardic.  He has slow a-fib and his bradycardia and low bp has improved with decrease in diltiazem extended release from 360 to 240mg  qd, and decrease in lopressor from 50 to 25mg  bid. No med changes today.  He plans on getting a bp cuff to monitor bp at home-->wrist cuff is best for him due to upper arm size/shape. Lytes/cr today.  3) CRI II/III: avoids NSAIDs.  Hydrates adequately.   Lytes/cr today.  4) Severe chronic lymphedema bilat LL's, with recurrent cellulitis. As per ID clinic recommendations, we are giving him monthly penicillin injections. He is due for this here today.  He will continue to f/u with wound clinic regularly. He asks me to look into possible referral to the Lymphedema clinic in [place].  I'll do some research about this first b/c I don't know of such a clinic.  5) High risk med use: I rx a small quantity of vicodin for him to use on some nights for his pain in LL's and this allows him to get some good sleep.  I eRx'd vicodin 5/325, 1-2 bid prn, #60 today.  An After Visit Summary was printed and given to the patient.  FOLLOW UP: Return in about 3 months (around  03/08/2019) for routine chronic illness f/u.  Signed:  Santiago Bumpers, MD           12/08/2018

## 2018-12-09 ENCOUNTER — Encounter: Payer: Self-pay | Admitting: *Deleted

## 2018-12-10 ENCOUNTER — Encounter

## 2018-12-10 ENCOUNTER — Ambulatory Visit: Payer: Medicare Other | Admitting: Internal Medicine

## 2018-12-15 DIAGNOSIS — I10 Essential (primary) hypertension: Secondary | ICD-10-CM | POA: Diagnosis not present

## 2018-12-15 DIAGNOSIS — S81802A Unspecified open wound, left lower leg, initial encounter: Secondary | ICD-10-CM | POA: Diagnosis not present

## 2018-12-15 DIAGNOSIS — S91302A Unspecified open wound, left foot, initial encounter: Secondary | ICD-10-CM | POA: Diagnosis not present

## 2018-12-15 DIAGNOSIS — L97822 Non-pressure chronic ulcer of other part of left lower leg with fat layer exposed: Secondary | ICD-10-CM | POA: Diagnosis not present

## 2018-12-15 DIAGNOSIS — I87333 Chronic venous hypertension (idiopathic) 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: Secondary | ICD-10-CM | POA: Diagnosis not present

## 2018-12-15 DIAGNOSIS — I890 Lymphedema, not elsewhere classified: Secondary | ICD-10-CM | POA: Diagnosis not present

## 2018-12-15 DIAGNOSIS — L97522 Non-pressure chronic ulcer of other part of left foot with fat layer exposed: Secondary | ICD-10-CM | POA: Diagnosis not present

## 2018-12-15 DIAGNOSIS — L97811 Non-pressure chronic ulcer of other part of right lower leg limited to breakdown of skin: Secondary | ICD-10-CM | POA: Diagnosis not present

## 2018-12-15 DIAGNOSIS — E1151 Type 2 diabetes mellitus with diabetic peripheral angiopathy without gangrene: Secondary | ICD-10-CM | POA: Diagnosis not present

## 2018-12-15 DIAGNOSIS — E11622 Type 2 diabetes mellitus with other skin ulcer: Secondary | ICD-10-CM | POA: Diagnosis not present

## 2018-12-15 DIAGNOSIS — L97821 Non-pressure chronic ulcer of other part of left lower leg limited to breakdown of skin: Secondary | ICD-10-CM | POA: Diagnosis not present

## 2018-12-15 DIAGNOSIS — L97529 Non-pressure chronic ulcer of other part of left foot with unspecified severity: Secondary | ICD-10-CM | POA: Diagnosis not present

## 2018-12-21 DIAGNOSIS — Z713 Dietary counseling and surveillance: Secondary | ICD-10-CM | POA: Diagnosis not present

## 2018-12-22 DIAGNOSIS — E1151 Type 2 diabetes mellitus with diabetic peripheral angiopathy without gangrene: Secondary | ICD-10-CM | POA: Diagnosis not present

## 2018-12-22 DIAGNOSIS — L97822 Non-pressure chronic ulcer of other part of left lower leg with fat layer exposed: Secondary | ICD-10-CM | POA: Diagnosis not present

## 2018-12-22 DIAGNOSIS — L97529 Non-pressure chronic ulcer of other part of left foot with unspecified severity: Secondary | ICD-10-CM | POA: Diagnosis not present

## 2018-12-22 DIAGNOSIS — I10 Essential (primary) hypertension: Secondary | ICD-10-CM | POA: Diagnosis not present

## 2018-12-22 DIAGNOSIS — L97229 Non-pressure chronic ulcer of left calf with unspecified severity: Secondary | ICD-10-CM | POA: Diagnosis not present

## 2018-12-22 DIAGNOSIS — E11622 Type 2 diabetes mellitus with other skin ulcer: Secondary | ICD-10-CM | POA: Diagnosis not present

## 2018-12-22 DIAGNOSIS — L97811 Non-pressure chronic ulcer of other part of right lower leg limited to breakdown of skin: Secondary | ICD-10-CM | POA: Diagnosis not present

## 2018-12-22 DIAGNOSIS — L97821 Non-pressure chronic ulcer of other part of left lower leg limited to breakdown of skin: Secondary | ICD-10-CM | POA: Diagnosis not present

## 2018-12-22 DIAGNOSIS — I87333 Chronic venous hypertension (idiopathic) 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: Secondary | ICD-10-CM | POA: Diagnosis not present

## 2018-12-22 DIAGNOSIS — L97829 Non-pressure chronic ulcer of other part of left lower leg with unspecified severity: Secondary | ICD-10-CM | POA: Diagnosis not present

## 2018-12-22 DIAGNOSIS — L97522 Non-pressure chronic ulcer of other part of left foot with fat layer exposed: Secondary | ICD-10-CM | POA: Diagnosis not present

## 2018-12-22 DIAGNOSIS — L97219 Non-pressure chronic ulcer of right calf with unspecified severity: Secondary | ICD-10-CM | POA: Diagnosis not present

## 2018-12-22 DIAGNOSIS — I890 Lymphedema, not elsewhere classified: Secondary | ICD-10-CM | POA: Diagnosis not present

## 2018-12-29 DIAGNOSIS — S90822A Blister (nonthermal), left foot, initial encounter: Secondary | ICD-10-CM | POA: Diagnosis not present

## 2018-12-29 DIAGNOSIS — L97821 Non-pressure chronic ulcer of other part of left lower leg limited to breakdown of skin: Secondary | ICD-10-CM | POA: Diagnosis not present

## 2018-12-29 DIAGNOSIS — I890 Lymphedema, not elsewhere classified: Secondary | ICD-10-CM | POA: Diagnosis not present

## 2018-12-29 DIAGNOSIS — S80822A Blister (nonthermal), left lower leg, initial encounter: Secondary | ICD-10-CM | POA: Diagnosis not present

## 2018-12-29 DIAGNOSIS — L97522 Non-pressure chronic ulcer of other part of left foot with fat layer exposed: Secondary | ICD-10-CM | POA: Diagnosis not present

## 2018-12-29 DIAGNOSIS — L97529 Non-pressure chronic ulcer of other part of left foot with unspecified severity: Secondary | ICD-10-CM | POA: Diagnosis not present

## 2018-12-29 DIAGNOSIS — L97811 Non-pressure chronic ulcer of other part of right lower leg limited to breakdown of skin: Secondary | ICD-10-CM | POA: Diagnosis not present

## 2018-12-29 DIAGNOSIS — I87333 Chronic venous hypertension (idiopathic) 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: Secondary | ICD-10-CM | POA: Diagnosis not present

## 2018-12-29 DIAGNOSIS — I10 Essential (primary) hypertension: Secondary | ICD-10-CM | POA: Diagnosis not present

## 2018-12-29 DIAGNOSIS — L97822 Non-pressure chronic ulcer of other part of left lower leg with fat layer exposed: Secondary | ICD-10-CM | POA: Diagnosis not present

## 2018-12-29 DIAGNOSIS — E11622 Type 2 diabetes mellitus with other skin ulcer: Secondary | ICD-10-CM | POA: Diagnosis not present

## 2018-12-29 DIAGNOSIS — S91105A Unspecified open wound of left lesser toe(s) without damage to nail, initial encounter: Secondary | ICD-10-CM | POA: Diagnosis not present

## 2018-12-29 DIAGNOSIS — E1151 Type 2 diabetes mellitus with diabetic peripheral angiopathy without gangrene: Secondary | ICD-10-CM | POA: Diagnosis not present

## 2018-12-31 DIAGNOSIS — Z7182 Exercise counseling: Secondary | ICD-10-CM | POA: Diagnosis not present

## 2019-01-05 ENCOUNTER — Encounter (HOSPITAL_BASED_OUTPATIENT_CLINIC_OR_DEPARTMENT_OTHER): Payer: Self-pay

## 2019-01-05 ENCOUNTER — Other Ambulatory Visit: Payer: Medicare Other

## 2019-01-05 ENCOUNTER — Ambulatory Visit (INDEPENDENT_AMBULATORY_CARE_PROVIDER_SITE_OTHER): Payer: Medicare Other

## 2019-01-05 ENCOUNTER — Encounter (HOSPITAL_BASED_OUTPATIENT_CLINIC_OR_DEPARTMENT_OTHER): Payer: Medicare Other | Attending: Internal Medicine

## 2019-01-05 DIAGNOSIS — I890 Lymphedema, not elsewhere classified: Secondary | ICD-10-CM | POA: Insufficient documentation

## 2019-01-05 DIAGNOSIS — L97522 Non-pressure chronic ulcer of other part of left foot with fat layer exposed: Secondary | ICD-10-CM | POA: Insufficient documentation

## 2019-01-05 DIAGNOSIS — E11622 Type 2 diabetes mellitus with other skin ulcer: Secondary | ICD-10-CM | POA: Diagnosis not present

## 2019-01-05 DIAGNOSIS — L97822 Non-pressure chronic ulcer of other part of left lower leg with fat layer exposed: Secondary | ICD-10-CM | POA: Diagnosis not present

## 2019-01-05 DIAGNOSIS — L97812 Non-pressure chronic ulcer of other part of right lower leg with fat layer exposed: Secondary | ICD-10-CM | POA: Insufficient documentation

## 2019-01-05 DIAGNOSIS — E1140 Type 2 diabetes mellitus with diabetic neuropathy, unspecified: Secondary | ICD-10-CM | POA: Insufficient documentation

## 2019-01-05 DIAGNOSIS — S81802A Unspecified open wound, left lower leg, initial encounter: Secondary | ICD-10-CM | POA: Diagnosis not present

## 2019-01-05 DIAGNOSIS — I87333 Chronic venous hypertension (idiopathic) 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: Secondary | ICD-10-CM | POA: Diagnosis not present

## 2019-01-05 DIAGNOSIS — E11621 Type 2 diabetes mellitus with foot ulcer: Secondary | ICD-10-CM | POA: Insufficient documentation

## 2019-01-05 DIAGNOSIS — B9562 Methicillin resistant Staphylococcus aureus infection as the cause of diseases classified elsewhere: Secondary | ICD-10-CM | POA: Insufficient documentation

## 2019-01-05 DIAGNOSIS — L03116 Cellulitis of left lower limb: Secondary | ICD-10-CM

## 2019-01-05 DIAGNOSIS — S81801A Unspecified open wound, right lower leg, initial encounter: Secondary | ICD-10-CM | POA: Diagnosis not present

## 2019-01-05 DIAGNOSIS — I872 Venous insufficiency (chronic) (peripheral): Secondary | ICD-10-CM | POA: Diagnosis not present

## 2019-01-05 MED ORDER — PENICILLIN G BENZATHINE 1200000 UNIT/2ML IM SUSP
600000.0000 [IU] | Freq: Once | INTRAMUSCULAR | Status: AC
  Administered 2019-01-05: 600000 [IU] via INTRAMUSCULAR

## 2019-01-05 NOTE — Progress Notes (Signed)
Pt presents for monthly Bicillin LA injections.  Per PCP order, Bicillin 600,000 units/ml (x2) administered in each upper outer quadrant. Patient tolerated injections well.  Patient scheduled for F/U with PCP 03/09/2019.   Weldon Picking, RN

## 2019-01-11 ENCOUNTER — Other Ambulatory Visit: Payer: Self-pay | Admitting: *Deleted

## 2019-01-11 MED ORDER — INSULIN PEN NEEDLE 31G X 8 MM MISC: 3 refills | Status: DC

## 2019-01-12 ENCOUNTER — Other Ambulatory Visit (HOSPITAL_COMMUNITY)
Admission: RE | Admit: 2019-01-12 | Discharge: 2019-01-12 | Disposition: A | Discharge status: Home / Self Care | Payer: Medicare Other | Source: Other Acute Inpatient Hospital | Attending: Internal Medicine | Admitting: Internal Medicine

## 2019-01-12 ENCOUNTER — Other Ambulatory Visit: Payer: Self-pay | Admitting: Family Medicine

## 2019-01-12 DIAGNOSIS — E11621 Type 2 diabetes mellitus with foot ulcer: Secondary | ICD-10-CM | POA: Diagnosis not present

## 2019-01-12 DIAGNOSIS — E11622 Type 2 diabetes mellitus with other skin ulcer: Secondary | ICD-10-CM | POA: Diagnosis not present

## 2019-01-12 DIAGNOSIS — L03116 Cellulitis of left lower limb: Secondary | ICD-10-CM | POA: Insufficient documentation

## 2019-01-12 DIAGNOSIS — B9562 Methicillin resistant Staphylococcus aureus infection as the cause of diseases classified elsewhere: Secondary | ICD-10-CM | POA: Diagnosis not present

## 2019-01-12 DIAGNOSIS — E1140 Type 2 diabetes mellitus with diabetic neuropathy, unspecified: Secondary | ICD-10-CM | POA: Diagnosis not present

## 2019-01-12 DIAGNOSIS — I872 Venous insufficiency (chronic) (peripheral): Secondary | ICD-10-CM | POA: Diagnosis not present

## 2019-01-12 DIAGNOSIS — L97822 Non-pressure chronic ulcer of other part of left lower leg with fat layer exposed: Secondary | ICD-10-CM | POA: Diagnosis not present

## 2019-01-12 DIAGNOSIS — L97812 Non-pressure chronic ulcer of other part of right lower leg with fat layer exposed: Secondary | ICD-10-CM | POA: Diagnosis not present

## 2019-01-12 DIAGNOSIS — I87333 Chronic venous hypertension (idiopathic) 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: Secondary | ICD-10-CM | POA: Diagnosis not present

## 2019-01-12 DIAGNOSIS — L97212 Non-pressure chronic ulcer of right calf with fat layer exposed: Secondary | ICD-10-CM | POA: Diagnosis not present

## 2019-01-12 DIAGNOSIS — L97522 Non-pressure chronic ulcer of other part of left foot with fat layer exposed: Secondary | ICD-10-CM | POA: Diagnosis not present

## 2019-01-12 DIAGNOSIS — I890 Lymphedema, not elsewhere classified: Secondary | ICD-10-CM | POA: Diagnosis not present

## 2019-01-12 NOTE — Telephone Encounter (Signed)
Called and spoke with patient and he stated the Relion pen needles are going to be cheaper at [facility] vs the mail order and needs that sent to [facility]. Sent to [employer] [facility], cancelled RX at [facility]

## 2019-01-14 DIAGNOSIS — L03116 Cellulitis of left lower limb: Secondary | ICD-10-CM | POA: Diagnosis not present

## 2019-01-14 DIAGNOSIS — I5032 Chronic diastolic (congestive) heart failure: Secondary | ICD-10-CM | POA: Diagnosis not present

## 2019-01-15 ENCOUNTER — Other Ambulatory Visit: Payer: Self-pay | Admitting: Family Medicine

## 2019-01-15 DIAGNOSIS — I890 Lymphedema, not elsewhere classified: Secondary | ICD-10-CM | POA: Diagnosis not present

## 2019-01-15 DIAGNOSIS — E11621 Type 2 diabetes mellitus with foot ulcer: Secondary | ICD-10-CM | POA: Diagnosis not present

## 2019-01-15 DIAGNOSIS — B9562 Methicillin resistant Staphylococcus aureus infection as the cause of diseases classified elsewhere: Secondary | ICD-10-CM | POA: Diagnosis not present

## 2019-01-15 DIAGNOSIS — E1140 Type 2 diabetes mellitus with diabetic neuropathy, unspecified: Secondary | ICD-10-CM | POA: Diagnosis not present

## 2019-01-15 DIAGNOSIS — E11622 Type 2 diabetes mellitus with other skin ulcer: Secondary | ICD-10-CM | POA: Diagnosis not present

## 2019-01-15 DIAGNOSIS — L97812 Non-pressure chronic ulcer of other part of right lower leg with fat layer exposed: Secondary | ICD-10-CM | POA: Diagnosis not present

## 2019-01-15 DIAGNOSIS — I87333 Chronic venous hypertension (idiopathic) 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: Secondary | ICD-10-CM | POA: Diagnosis not present

## 2019-01-15 DIAGNOSIS — L97822 Non-pressure chronic ulcer of other part of left lower leg with fat layer exposed: Secondary | ICD-10-CM | POA: Diagnosis not present

## 2019-01-15 DIAGNOSIS — L97522 Non-pressure chronic ulcer of other part of left foot with fat layer exposed: Secondary | ICD-10-CM | POA: Diagnosis not present

## 2019-01-15 MED ORDER — RIVAROXABAN 20 MG PO TABS: ORAL_TABLET | ORAL | 1 refills | Status: DC

## 2019-01-17 DIAGNOSIS — G4710 Hypersomnia, unspecified: Secondary | ICD-10-CM | POA: Diagnosis not present

## 2019-01-17 DIAGNOSIS — G4733 Obstructive sleep apnea (adult) (pediatric): Secondary | ICD-10-CM | POA: Diagnosis not present

## 2019-01-17 LAB — AEROBIC CULTURE  (SUPERFICIAL SPECIMEN)

## 2019-01-17 LAB — AEROBIC CULTURE W GRAM STAIN (SUPERFICIAL SPECIMEN): Gram Stain: NONE SEEN

## 2019-01-20 DIAGNOSIS — I890 Lymphedema, not elsewhere classified: Secondary | ICD-10-CM | POA: Diagnosis not present

## 2019-01-20 DIAGNOSIS — B9562 Methicillin resistant Staphylococcus aureus infection as the cause of diseases classified elsewhere: Secondary | ICD-10-CM | POA: Diagnosis not present

## 2019-01-20 DIAGNOSIS — E11621 Type 2 diabetes mellitus with foot ulcer: Secondary | ICD-10-CM | POA: Diagnosis not present

## 2019-01-20 DIAGNOSIS — E11622 Type 2 diabetes mellitus with other skin ulcer: Secondary | ICD-10-CM | POA: Diagnosis not present

## 2019-01-20 DIAGNOSIS — L97812 Non-pressure chronic ulcer of other part of right lower leg with fat layer exposed: Secondary | ICD-10-CM | POA: Diagnosis not present

## 2019-01-20 DIAGNOSIS — L97822 Non-pressure chronic ulcer of other part of left lower leg with fat layer exposed: Secondary | ICD-10-CM | POA: Diagnosis not present

## 2019-01-20 DIAGNOSIS — E1140 Type 2 diabetes mellitus with diabetic neuropathy, unspecified: Secondary | ICD-10-CM | POA: Diagnosis not present

## 2019-01-20 DIAGNOSIS — L97222 Non-pressure chronic ulcer of left calf with fat layer exposed: Secondary | ICD-10-CM | POA: Diagnosis not present

## 2019-01-20 DIAGNOSIS — L97522 Non-pressure chronic ulcer of other part of left foot with fat layer exposed: Secondary | ICD-10-CM | POA: Diagnosis not present

## 2019-01-20 DIAGNOSIS — I87332 Chronic venous hypertension (idiopathic) with ulcer and inflammation of left lower extremity: Secondary | ICD-10-CM | POA: Diagnosis not present

## 2019-01-20 DIAGNOSIS — I87333 Chronic venous hypertension (idiopathic) 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: Secondary | ICD-10-CM | POA: Diagnosis not present

## 2019-01-22 NOTE — Progress Notes (Signed)
Pt with chronic/recurrent bilat LL cellulitis. Agree with monthly penicillin injections as per recommendation of infectious dz clinic. Signed:  Santiago Bumpers, MD           01/22/2019

## 2019-01-27 DIAGNOSIS — L97812 Non-pressure chronic ulcer of other part of right lower leg with fat layer exposed: Secondary | ICD-10-CM | POA: Diagnosis not present

## 2019-01-27 DIAGNOSIS — B9562 Methicillin resistant Staphylococcus aureus infection as the cause of diseases classified elsewhere: Secondary | ICD-10-CM | POA: Diagnosis not present

## 2019-01-27 DIAGNOSIS — E11621 Type 2 diabetes mellitus with foot ulcer: Secondary | ICD-10-CM | POA: Diagnosis not present

## 2019-01-27 DIAGNOSIS — E11622 Type 2 diabetes mellitus with other skin ulcer: Secondary | ICD-10-CM | POA: Diagnosis not present

## 2019-01-27 DIAGNOSIS — I87333 Chronic venous hypertension (idiopathic) 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: Secondary | ICD-10-CM | POA: Diagnosis not present

## 2019-01-27 DIAGNOSIS — L97829 Non-pressure chronic ulcer of other part of left lower leg with unspecified severity: Secondary | ICD-10-CM | POA: Diagnosis not present

## 2019-01-27 DIAGNOSIS — E1140 Type 2 diabetes mellitus with diabetic neuropathy, unspecified: Secondary | ICD-10-CM | POA: Diagnosis not present

## 2019-01-27 DIAGNOSIS — L97522 Non-pressure chronic ulcer of other part of left foot with fat layer exposed: Secondary | ICD-10-CM | POA: Diagnosis not present

## 2019-01-27 DIAGNOSIS — L97822 Non-pressure chronic ulcer of other part of left lower leg with fat layer exposed: Secondary | ICD-10-CM | POA: Diagnosis not present

## 2019-01-27 DIAGNOSIS — L97819 Non-pressure chronic ulcer of other part of right lower leg with unspecified severity: Secondary | ICD-10-CM | POA: Diagnosis not present

## 2019-01-27 DIAGNOSIS — I890 Lymphedema, not elsewhere classified: Secondary | ICD-10-CM | POA: Diagnosis not present

## 2019-02-03 ENCOUNTER — Encounter (HOSPITAL_BASED_OUTPATIENT_CLINIC_OR_DEPARTMENT_OTHER): Payer: Medicare Other | Attending: Physician Assistant

## 2019-02-03 ENCOUNTER — Other Ambulatory Visit: Payer: Self-pay

## 2019-02-03 DIAGNOSIS — I10 Essential (primary) hypertension: Secondary | ICD-10-CM | POA: Insufficient documentation

## 2019-02-03 DIAGNOSIS — E11621 Type 2 diabetes mellitus with foot ulcer: Secondary | ICD-10-CM | POA: Insufficient documentation

## 2019-02-03 DIAGNOSIS — E1140 Type 2 diabetes mellitus with diabetic neuropathy, unspecified: Secondary | ICD-10-CM | POA: Diagnosis not present

## 2019-02-03 DIAGNOSIS — Z794 Long term (current) use of insulin: Secondary | ICD-10-CM | POA: Insufficient documentation

## 2019-02-03 DIAGNOSIS — I872 Venous insufficiency (chronic) (peripheral): Secondary | ICD-10-CM | POA: Diagnosis not present

## 2019-02-03 DIAGNOSIS — E11622 Type 2 diabetes mellitus with other skin ulcer: Secondary | ICD-10-CM | POA: Insufficient documentation

## 2019-02-03 DIAGNOSIS — I890 Lymphedema, not elsewhere classified: Secondary | ICD-10-CM | POA: Diagnosis not present

## 2019-02-03 DIAGNOSIS — L97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 right calf limited to breakdown of skin: Secondary | ICD-10-CM | POA: Diagnosis not present

## 2019-02-03 DIAGNOSIS — L97219 Non-pressure chronic ulcer of right calf with unspecified severity: Secondary | ICD-10-CM | POA: Diagnosis not present

## 2019-02-03 DIAGNOSIS — L97522 Non-pressure chronic ulcer of other part of left foot with fat layer exposed: Secondary | ICD-10-CM | POA: Diagnosis not present

## 2019-02-08 ENCOUNTER — Other Ambulatory Visit: Payer: Self-pay

## 2019-02-08 ENCOUNTER — Ambulatory Visit (INDEPENDENT_AMBULATORY_CARE_PROVIDER_SITE_OTHER): Payer: Medicare Other | Admitting: Family Medicine

## 2019-02-08 DIAGNOSIS — L03116 Cellulitis of left lower limb: Secondary | ICD-10-CM

## 2019-02-08 DIAGNOSIS — L0390 Cellulitis, unspecified: Secondary | ICD-10-CM

## 2019-02-08 NOTE — Progress Notes (Signed)
Patient presented to office for Bicillin 600,000 Units x 2 injections as instructed to do every month.  Patient tolerated both injections well.

## 2019-02-09 ENCOUNTER — Ambulatory Visit: Payer: Medicare Other

## 2019-02-10 DIAGNOSIS — L97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 right calf limited to breakdown of skin: Secondary | ICD-10-CM | POA: Diagnosis not present

## 2019-02-10 DIAGNOSIS — E1140 Type 2 diabetes mellitus with diabetic neuropathy, unspecified: Secondary | ICD-10-CM | POA: Diagnosis not present

## 2019-02-10 DIAGNOSIS — L97522 Non-pressure chronic ulcer of other part of left foot with fat layer exposed: Secondary | ICD-10-CM | POA: Diagnosis not present

## 2019-02-10 DIAGNOSIS — Z794 Long term (current) use of insulin: Secondary | ICD-10-CM | POA: Diagnosis not present

## 2019-02-10 DIAGNOSIS — I10 Essential (primary) hypertension: Secondary | ICD-10-CM | POA: Diagnosis not present

## 2019-02-10 DIAGNOSIS — E11621 Type 2 diabetes mellitus with foot ulcer: Secondary | ICD-10-CM | POA: Diagnosis not present

## 2019-02-10 DIAGNOSIS — E11622 Type 2 diabetes mellitus with other skin ulcer: Secondary | ICD-10-CM | POA: Diagnosis not present

## 2019-02-15 ENCOUNTER — Other Ambulatory Visit: Payer: Self-pay | Admitting: Family Medicine

## 2019-02-17 ENCOUNTER — Other Ambulatory Visit: Payer: Self-pay | Admitting: Family Medicine

## 2019-02-17 DIAGNOSIS — E11622 Type 2 diabetes mellitus with other skin ulcer: Secondary | ICD-10-CM | POA: Diagnosis not present

## 2019-02-17 DIAGNOSIS — E11621 Type 2 diabetes mellitus with foot ulcer: Secondary | ICD-10-CM | POA: Diagnosis not present

## 2019-02-17 DIAGNOSIS — I10 Essential (primary) hypertension: Secondary | ICD-10-CM | POA: Diagnosis not present

## 2019-02-17 DIAGNOSIS — I890 Lymphedema, not elsewhere classified: Secondary | ICD-10-CM | POA: Diagnosis not present

## 2019-02-17 DIAGNOSIS — I872 Venous insufficiency (chronic) (peripheral): Secondary | ICD-10-CM | POA: Diagnosis not present

## 2019-02-17 DIAGNOSIS — Z794 Long term (current) use of insulin: Secondary | ICD-10-CM | POA: Diagnosis not present

## 2019-02-17 DIAGNOSIS — L97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 right calf limited to breakdown of skin: Secondary | ICD-10-CM | POA: Diagnosis not present

## 2019-02-17 DIAGNOSIS — E1140 Type 2 diabetes mellitus with diabetic neuropathy, unspecified: Secondary | ICD-10-CM | POA: Diagnosis not present

## 2019-02-17 DIAGNOSIS — L97522 Non-pressure chronic ulcer of other part of left foot with fat layer exposed: Secondary | ICD-10-CM | POA: Diagnosis not present

## 2019-02-17 DIAGNOSIS — L97219 Non-pressure chronic ulcer of right calf with unspecified severity: Secondary | ICD-10-CM | POA: Diagnosis not present

## 2019-02-17 MED ORDER — HYDROCODONE-ACETAMINOPHEN 5-325 MG PO TABS: ORAL_TABLET | ORAL | 0 refills | Status: DC

## 2019-02-17 NOTE — Telephone Encounter (Signed)
RF request for Hydrocodone. Last OV 12/08/2018 Next OV 03/09/2019 Last RX 12/08/2018 # 60 x NO rfs.  Please advise.

## 2019-02-17 NOTE — Telephone Encounter (Signed)
Patient called to check on the status of his refill.

## 2019-02-18 NOTE — Telephone Encounter (Signed)
Patient was made aware

## 2019-02-23 DIAGNOSIS — E11622 Type 2 diabetes mellitus with other skin ulcer: Secondary | ICD-10-CM | POA: Diagnosis not present

## 2019-02-23 DIAGNOSIS — Z794 Long term (current) use of insulin: Secondary | ICD-10-CM | POA: Diagnosis not present

## 2019-02-23 DIAGNOSIS — L97522 Non-pressure chronic ulcer of other part of left foot with fat layer exposed: Secondary | ICD-10-CM | POA: Diagnosis not present

## 2019-02-23 DIAGNOSIS — L97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 right calf limited to breakdown of skin: Secondary | ICD-10-CM | POA: Diagnosis not present

## 2019-02-23 DIAGNOSIS — I10 Essential (primary) hypertension: Secondary | ICD-10-CM | POA: Diagnosis not present

## 2019-02-23 DIAGNOSIS — E1140 Type 2 diabetes mellitus with diabetic neuropathy, unspecified: Secondary | ICD-10-CM | POA: Diagnosis not present

## 2019-02-23 DIAGNOSIS — E11621 Type 2 diabetes mellitus with foot ulcer: Secondary | ICD-10-CM | POA: Diagnosis not present

## 2019-03-01 NOTE — Progress Notes (Signed)
Pt with recurrent/chronic bilat LL cellulitis. As per infectious disease MD recommendations, agree with penicillin IM q month. Signed:  Santiago Bumpers, MD           03/01/2019

## 2019-03-03 DIAGNOSIS — L97522 Non-pressure chronic ulcer of other part of left foot with fat layer exposed: Secondary | ICD-10-CM | POA: Diagnosis not present

## 2019-03-03 DIAGNOSIS — E11622 Type 2 diabetes mellitus with other skin ulcer: Secondary | ICD-10-CM | POA: Diagnosis not present

## 2019-03-03 DIAGNOSIS — Z794 Long term (current) use of insulin: Secondary | ICD-10-CM | POA: Diagnosis not present

## 2019-03-03 DIAGNOSIS — E11621 Type 2 diabetes mellitus with foot ulcer: Secondary | ICD-10-CM | POA: Diagnosis not present

## 2019-03-03 DIAGNOSIS — I10 Essential (primary) hypertension: Secondary | ICD-10-CM | POA: Diagnosis not present

## 2019-03-03 DIAGNOSIS — L97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 right calf limited to breakdown of skin: Secondary | ICD-10-CM | POA: Diagnosis not present

## 2019-03-03 DIAGNOSIS — E1140 Type 2 diabetes mellitus with diabetic neuropathy, unspecified: Secondary | ICD-10-CM | POA: Diagnosis not present

## 2019-03-08 ENCOUNTER — Other Ambulatory Visit: Payer: Self-pay

## 2019-03-08 ENCOUNTER — Encounter (HOSPITAL_BASED_OUTPATIENT_CLINIC_OR_DEPARTMENT_OTHER): Payer: Medicare Other | Attending: Internal Medicine

## 2019-03-08 DIAGNOSIS — L97822 Non-pressure chronic ulcer of other part of left lower leg with fat layer exposed: Secondary | ICD-10-CM | POA: Insufficient documentation

## 2019-03-08 DIAGNOSIS — E11622 Type 2 diabetes mellitus with other skin ulcer: Secondary | ICD-10-CM | POA: Insufficient documentation

## 2019-03-08 DIAGNOSIS — I890 Lymphedema, not elsewhere classified: Secondary | ICD-10-CM | POA: Diagnosis not present

## 2019-03-08 DIAGNOSIS — L97222 Non-pressure chronic ulcer of left calf with fat layer exposed: Secondary | ICD-10-CM | POA: Insufficient documentation

## 2019-03-08 DIAGNOSIS — E1140 Type 2 diabetes mellitus with diabetic neuropathy, unspecified: Secondary | ICD-10-CM | POA: Diagnosis not present

## 2019-03-08 DIAGNOSIS — I87312 Chronic venous hypertension (idiopathic) with ulcer of left lower extremity: Secondary | ICD-10-CM | POA: Diagnosis not present

## 2019-03-08 DIAGNOSIS — L03116 Cellulitis of left lower limb: Secondary | ICD-10-CM | POA: Insufficient documentation

## 2019-03-08 DIAGNOSIS — I87333 Chronic venous hypertension (idiopathic) 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: Secondary | ICD-10-CM | POA: Diagnosis not present

## 2019-03-08 DIAGNOSIS — L97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 right calf limited to breakdown of skin: Secondary | ICD-10-CM | POA: Diagnosis not present

## 2019-03-08 DIAGNOSIS — S91302A Unspecified open wound, left foot, initial encounter: Secondary | ICD-10-CM | POA: Diagnosis not present

## 2019-03-09 ENCOUNTER — Ambulatory Visit: Payer: Medicare Other | Admitting: Family Medicine

## 2019-03-10 ENCOUNTER — Encounter (HOSPITAL_BASED_OUTPATIENT_CLINIC_OR_DEPARTMENT_OTHER): Payer: Medicare Other

## 2019-03-10 ENCOUNTER — Ambulatory Visit: Payer: Medicare Other

## 2019-03-12 ENCOUNTER — Ambulatory Visit (INDEPENDENT_AMBULATORY_CARE_PROVIDER_SITE_OTHER): Payer: Medicare Other | Admitting: Family Medicine

## 2019-03-12 DIAGNOSIS — L97222 Non-pressure chronic ulcer of left calf with fat layer exposed: Secondary | ICD-10-CM | POA: Diagnosis not present

## 2019-03-12 DIAGNOSIS — I87333 Chronic venous hypertension (idiopathic) 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: Secondary | ICD-10-CM | POA: Diagnosis not present

## 2019-03-12 DIAGNOSIS — L03116 Cellulitis of left lower limb: Secondary | ICD-10-CM

## 2019-03-12 DIAGNOSIS — E11622 Type 2 diabetes mellitus with other skin ulcer: Secondary | ICD-10-CM | POA: Diagnosis not present

## 2019-03-12 DIAGNOSIS — L03119 Cellulitis of unspecified part of limb: Secondary | ICD-10-CM | POA: Diagnosis not present

## 2019-03-12 DIAGNOSIS — L97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 right calf limited to breakdown of skin: Secondary | ICD-10-CM | POA: Diagnosis not present

## 2019-03-12 DIAGNOSIS — E1140 Type 2 diabetes mellitus with diabetic neuropathy, unspecified: Secondary | ICD-10-CM | POA: Diagnosis not present

## 2019-03-12 DIAGNOSIS — L97822 Non-pressure chronic ulcer of other part of left lower leg with fat layer exposed: Secondary | ICD-10-CM | POA: Diagnosis not present

## 2019-03-12 DIAGNOSIS — I890 Lymphedema, not elsewhere classified: Secondary | ICD-10-CM | POA: Diagnosis not present

## 2019-03-12 NOTE — Progress Notes (Signed)
Patient presented to office for monthly bicillin injections.  Both injections tolerated well.

## 2019-03-15 ENCOUNTER — Other Ambulatory Visit: Payer: Self-pay | Admitting: Family Medicine

## 2019-03-15 ENCOUNTER — Encounter: Payer: Self-pay | Admitting: Family Medicine

## 2019-03-15 NOTE — Progress Notes (Signed)
Pt with hx of recurrent cellulitis of LL's. As per ID clinic recommendations, he is to get monthly pen G injections. Agree with this injection today. Signed:  Santiago Bumpers, MD           03/15/2019

## 2019-03-17 DIAGNOSIS — I890 Lymphedema, not elsewhere classified: Secondary | ICD-10-CM | POA: Diagnosis not present

## 2019-03-17 DIAGNOSIS — E1140 Type 2 diabetes mellitus with diabetic neuropathy, unspecified: Secondary | ICD-10-CM | POA: Diagnosis not present

## 2019-03-17 DIAGNOSIS — L97222 Non-pressure chronic ulcer of left calf with fat layer exposed: Secondary | ICD-10-CM | POA: Diagnosis not present

## 2019-03-17 DIAGNOSIS — L97822 Non-pressure chronic ulcer of other part of left lower leg with fat layer exposed: Secondary | ICD-10-CM | POA: Diagnosis not present

## 2019-03-17 DIAGNOSIS — L03116 Cellulitis of left lower limb: Secondary | ICD-10-CM | POA: Diagnosis not present

## 2019-03-17 DIAGNOSIS — E11622 Type 2 diabetes mellitus with other skin ulcer: Secondary | ICD-10-CM | POA: Diagnosis not present

## 2019-03-17 DIAGNOSIS — L97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 right calf limited to breakdown of skin: Secondary | ICD-10-CM | POA: Diagnosis not present

## 2019-03-17 DIAGNOSIS — I87312 Chronic venous hypertension (idiopathic) with ulcer of left lower extremity: Secondary | ICD-10-CM | POA: Diagnosis not present

## 2019-03-17 DIAGNOSIS — I87333 Chronic venous hypertension (idiopathic) 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: Secondary | ICD-10-CM | POA: Diagnosis not present

## 2019-03-24 DIAGNOSIS — L97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 right calf limited to breakdown of skin: Secondary | ICD-10-CM | POA: Diagnosis not present

## 2019-03-24 DIAGNOSIS — L97822 Non-pressure chronic ulcer of other part of left lower leg with fat layer exposed: Secondary | ICD-10-CM | POA: Diagnosis not present

## 2019-03-24 DIAGNOSIS — L97222 Non-pressure chronic ulcer of left calf with fat layer exposed: Secondary | ICD-10-CM | POA: Diagnosis not present

## 2019-03-24 DIAGNOSIS — E1140 Type 2 diabetes mellitus with diabetic neuropathy, unspecified: Secondary | ICD-10-CM | POA: Diagnosis not present

## 2019-03-24 DIAGNOSIS — E11622 Type 2 diabetes mellitus with other skin ulcer: Secondary | ICD-10-CM | POA: Diagnosis not present

## 2019-03-24 DIAGNOSIS — I890 Lymphedema, not elsewhere classified: Secondary | ICD-10-CM | POA: Diagnosis not present

## 2019-03-24 DIAGNOSIS — S81801A Unspecified open wound, right lower leg, initial encounter: Secondary | ICD-10-CM | POA: Diagnosis not present

## 2019-03-24 DIAGNOSIS — L03116 Cellulitis of left lower limb: Secondary | ICD-10-CM | POA: Diagnosis not present

## 2019-03-24 DIAGNOSIS — I87333 Chronic venous hypertension (idiopathic) 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: Secondary | ICD-10-CM | POA: Diagnosis not present

## 2019-03-24 DIAGNOSIS — S81851A Open bite, right lower leg, initial encounter: Secondary | ICD-10-CM | POA: Diagnosis not present

## 2019-03-24 DIAGNOSIS — S81802A Unspecified open wound, left lower leg, initial encounter: Secondary | ICD-10-CM | POA: Diagnosis not present

## 2019-03-30 ENCOUNTER — Other Ambulatory Visit: Payer: Self-pay | Admitting: Family Medicine

## 2019-03-31 DIAGNOSIS — L97822 Non-pressure chronic ulcer of other part of left lower leg with fat layer exposed: Secondary | ICD-10-CM | POA: Diagnosis not present

## 2019-03-31 DIAGNOSIS — L03116 Cellulitis of left lower limb: Secondary | ICD-10-CM | POA: Diagnosis not present

## 2019-03-31 DIAGNOSIS — L97222 Non-pressure chronic ulcer of left calf with fat layer exposed: Secondary | ICD-10-CM | POA: Diagnosis not present

## 2019-03-31 DIAGNOSIS — I87333 Chronic venous hypertension (idiopathic) 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: Secondary | ICD-10-CM | POA: Diagnosis not present

## 2019-03-31 DIAGNOSIS — I890 Lymphedema, not elsewhere classified: Secondary | ICD-10-CM | POA: Diagnosis not present

## 2019-03-31 DIAGNOSIS — E1140 Type 2 diabetes mellitus with diabetic neuropathy, unspecified: Secondary | ICD-10-CM | POA: Diagnosis not present

## 2019-03-31 DIAGNOSIS — L97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 right calf limited to breakdown of skin: Secondary | ICD-10-CM | POA: Diagnosis not present

## 2019-03-31 DIAGNOSIS — E11622 Type 2 diabetes mellitus with other skin ulcer: Secondary | ICD-10-CM | POA: Diagnosis not present

## 2019-04-01 DIAGNOSIS — E1140 Type 2 diabetes mellitus with diabetic neuropathy, unspecified: Secondary | ICD-10-CM | POA: Diagnosis not present

## 2019-04-01 DIAGNOSIS — I87333 Chronic venous hypertension (idiopathic) 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: Secondary | ICD-10-CM | POA: Diagnosis not present

## 2019-04-01 DIAGNOSIS — L97222 Non-pressure chronic ulcer of left calf with fat layer exposed: Secondary | ICD-10-CM | POA: Diagnosis not present

## 2019-04-01 DIAGNOSIS — L97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 right calf limited to breakdown of skin: Secondary | ICD-10-CM | POA: Diagnosis not present

## 2019-04-01 DIAGNOSIS — L03116 Cellulitis of left lower limb: Secondary | ICD-10-CM | POA: Diagnosis not present

## 2019-04-01 DIAGNOSIS — L97822 Non-pressure chronic ulcer of other part of left lower leg with fat layer exposed: Secondary | ICD-10-CM | POA: Diagnosis not present

## 2019-04-01 DIAGNOSIS — I890 Lymphedema, not elsewhere classified: Secondary | ICD-10-CM | POA: Diagnosis not present

## 2019-04-01 DIAGNOSIS — E11622 Type 2 diabetes mellitus with other skin ulcer: Secondary | ICD-10-CM | POA: Diagnosis not present

## 2019-04-05 ENCOUNTER — Ambulatory Visit: Payer: Medicare Other | Admitting: Family Medicine

## 2019-04-05 ENCOUNTER — Ambulatory Visit (INDEPENDENT_AMBULATORY_CARE_PROVIDER_SITE_OTHER): Payer: Medicare Other | Admitting: Family Medicine

## 2019-04-05 ENCOUNTER — Other Ambulatory Visit: Payer: Self-pay

## 2019-04-05 ENCOUNTER — Encounter: Payer: Self-pay | Admitting: Family Medicine

## 2019-04-05 VITALS — BP 130/84 | HR 67 | Temp 98.2°F | Resp 16 | Ht 70.0 in | Wt 373.4 lb

## 2019-04-05 DIAGNOSIS — L0390 Cellulitis, unspecified: Secondary | ICD-10-CM

## 2019-04-05 DIAGNOSIS — I890 Lymphedema, not elsewhere classified: Secondary | ICD-10-CM

## 2019-04-05 DIAGNOSIS — I4891 Unspecified atrial fibrillation: Secondary | ICD-10-CM | POA: Diagnosis not present

## 2019-04-05 DIAGNOSIS — E118 Type 2 diabetes mellitus with unspecified complications: Secondary | ICD-10-CM

## 2019-04-05 DIAGNOSIS — Z7901 Long term (current) use of anticoagulants: Secondary | ICD-10-CM | POA: Diagnosis not present

## 2019-04-05 DIAGNOSIS — I10 Essential (primary) hypertension: Secondary | ICD-10-CM

## 2019-04-05 DIAGNOSIS — N182 Chronic kidney disease, stage 2 (mild): Secondary | ICD-10-CM | POA: Diagnosis not present

## 2019-04-05 LAB — CBC
HCT: 37.7 % — ABNORMAL LOW (ref 39.0–52.0)
Hemoglobin: 12.6 g/dL — ABNORMAL LOW (ref 13.0–17.0)
MCHC: 33.4 g/dL (ref 30.0–36.0)
MCV: 82 fl (ref 78.0–100.0)
Platelets: 185 10*3/uL (ref 150.0–400.0)
RBC: 4.6 Mil/uL (ref 4.22–5.81)
RDW: 18.7 % — ABNORMAL HIGH (ref 11.5–15.5)
WBC: 6.3 10*3/uL (ref 4.0–10.5)

## 2019-04-05 LAB — COMPREHENSIVE METABOLIC PANEL
ALT: 9 U/L (ref 0–53)
AST: 13 U/L (ref 0–37)
Albumin: 3.7 g/dL (ref 3.5–5.2)
Alkaline Phosphatase: 76 U/L (ref 39–117)
BUN: 18 mg/dL (ref 6–23)
CO2: 28 mEq/L (ref 19–32)
Calcium: 8.8 mg/dL (ref 8.4–10.5)
Chloride: 100 mEq/L (ref 96–112)
Creatinine, Ser: 1.23 mg/dL (ref 0.40–1.50)
GFR: 58.52 mL/min — ABNORMAL LOW (ref >60.00)
Glucose, Bld: 131 mg/dL — ABNORMAL HIGH (ref 70–99)
Potassium: 4.4 mEq/L (ref 3.5–5.1)
Sodium: 138 mEq/L (ref 135–145)
Total Bilirubin: 0.9 mg/dL (ref 0.2–1.2)
Total Protein: 6.9 g/dL (ref 6.0–8.3)

## 2019-04-05 LAB — MICROALBUMIN / CREATININE URINE RATIO
Creatinine,U: 154.1 mg/dL
Microalb Creat Ratio: 10.7 mg/g (ref 0.0–30.0)
Microalb, Ur: 16.6 mg/dL — ABNORMAL HIGH (ref 0.0–1.9)

## 2019-04-05 LAB — HEMOGLOBIN A1C: Hgb A1c MFr Bld: 7.2 % — ABNORMAL HIGH (ref 4.6–6.5)

## 2019-04-05 NOTE — Progress Notes (Signed)
OFFICE VISIT  04/05/2019   CC:  Chief Complaint  Patient presents with  . Follow-up    [name], pt is not fasting    HPI:    Patient is a 68 y.o. Caucasian male who presents for 3 mo f/u DM 2, HTN, CRI with GFR in the 60s.  He has chronic bilat LL cellulitis as a complication of his severe lymphedema and recurrent stasis ulcers.  He gets routine management at the wound care clinic and infectious dz. He gets pen G injection one a month here and is due today for this. He also has chronic a-fib and takes xarelto.  He is doing fine today, no acute complaints and no questions.  DM: fasting glucoses range 120-180.  Using 75/25 insulin: SS.   HTN:  Home monitoring regularly 130/80 or better.  CRI: avoids NSAIDs.  Takes vicodin every night to help with leg pains so he can sleep better. Tries to hydrate well but the last couple days he says he hasn't done well with this.  ROS: no CP, no SOB, no wheezing, no cough, no dizziness, no HAs, no rashes, no melena/hematochezia.  No polyuria or polydipsia.  No myalgias or arthralgias.   Past Medical History:  Diagnosis Date  . ALLERGIC RHINITIS 08/11/2006  . ASTHMA 08/11/2006  . Chronic atrial fibrillation 08/2014  . Chronic combined systolic and diastolic CHF (congestive heart failure) [facility])    Cardiology f/u 12/2017: pt volume overloaded (R heart dysf suspected), BNP very high, lasix increased.  Repeat echo 12/2017: normal LV EF, mild DD, +RV syst dysfxn, mod pulm HTN, biatrial enlgmt.  . Chronic constipation   . Chronic renal insufficiency, stage 2 (mild)    Borderline stage III (GFR 60s).  . DIABETES MELLITUS, TYPE II 08/11/2006  . DM W/RENAL MANIFESTATIONS, TYPE II 04/20/2007  . HYPERTENSION 08/11/2006  . MRSA infection 06/2018   LL venous stasis ulcer infected  . Normocytic anemia 2016-2019   03/2018 B12 normal, iron ok (ferritin borderline low).  Plan repeat CBC at f/u summer 2019 and if decreased from baseline will check hemoccults and iron  labs again.  . OBESITY, MORBID 12/14/2007  . OSA (obstructive sleep apnea)    to get sleep study with [facility]) as of 12/03/2018 consult.  . 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  92018 ID started him on amoxil prophylaxis---ineffective.  End 2018/Jan 2019 penicillin G injections prophyl helpful but pt declined to continue this as of 01/2018 ID f/u.  ID talked him into resuming monthly penicillin G as of 02/2018 f/u.    Marland Kitchen Restless leg syndrome    Rx'd clonazepam 09/2017 and pt refused to take it after reading the medication's potential side effects.  . Venous stasis ulcers of both lower extremities (HCC)    Severe lymphedema.  wound clinic care ongoing as of 01/2018    Past Surgical History:  Procedure Laterality Date  . Carotid dopplers  07/23/2018   Left NORMAL.  Right 1-39% ICA stenosis, with <50% distal CCA stenosis (not hemodynamically significant)  . TRANSTHORACIC ECHOCARDIOGRAM  11/2007; 09/2014; 11/2015;12/2017   LV fxn normal, EF normal, mild dilation of left atrium.  2015 grade II diast dysfxn.  2017 EF 55-60%. 2019: LVEF 60-65%, mild RV syst dysf,biatrial enlgmt, mod pulm htn.  Marland Kitchen URETERAL STENT PLACEMENT    . virtual colonoscopy  01/2011   Normal    Outpatient Medications Prior to Visit  Medication Sig Dispense Refill  .  b complex vitamins capsule Take 1 capsule by mouth every morning.    . butalbital-acetaminophen-caffeine (FIORICET WITH CODEINE) 50-325-40-30 MG capsule TAKE 1 TO 2 CAPSULES BY  MOUTH EVERY 6 HOURS AS  NEEDED FOR HEADACHE(S)  MANUFACTURER RECOMMENDS NOT EXCEEDING 6 CAPSULES/DAY 60 capsule 1  . cetirizine (ZYRTEC) 10 MG tablet Take 10 mg by mouth daily.    . Cinnamon 500 MG TABS Take by mouth. Reported on 02/19/2016    . diltiazem (TIAZAC) 240 MG 24 hr capsule Take 1 capsule (240 mg total) by mouth daily. 90 capsule 1  . furosemide (LASIX) 40 MG tablet TAKE 2 TABLETS BY MOUTH TWO  TIMES DAILY 360 tablet 1  . glucose blood (ONE TOUCH ULTRA TEST) test strip CHECK BLOOD SUGAR TWO TIMES DAILY 100 each 11  . HUMALOG MIX 75/25 KWIKPEN (75-25) 100 UNIT/ML Kwikpen INJECT SUBCUTANEOUSLY 25  UNITS EVERY MORNING AND 20  UNITS EVERY EVENING AT  SUPPER 45 mL 0  . HYDROcodone-acetaminophen (NORCO/VICODIN) 5-325 MG tablet 1-2 tabs po bid prn pain 60 tablet 0  . Insulin Pen Needle (RELION PEN NEEDLE 31G/8MM) 31G X 8 MM MISC USE 1  TWICE DAILY 50 each 0  . lisinopril (PRINIVIL,ZESTRIL) 40 MG tablet TAKE 1 TABLET BY MOUTH  EVERY DAY 90 tablet 1  . metFORMIN (GLUCOPHAGE) 1000 MG tablet TAKE 1 TABLET BY MOUTH  TWICE A DAY WITH MEALS 180 tablet 1  . metoprolol tartrate (LOPRESSOR) 50 MG tablet Take 25 mg by mouth 2 (two) times daily.    . Multiple Vitamin (MULTIVITAMIN) tablet Take 1 tablet by mouth daily.    . saw palmetto 500 MG capsule Take 500 mg by mouth daily.    Marland Kitchen. terazosin (HYTRIN) 10 MG capsule TAKE 1 CAPSULE BY MOUTH  ONCE A DAY AT BEDTIME 90 capsule 1  . vitamin E 400 UNIT capsule Take 400 Units by mouth every morning. Selenium 50mg     . XARELTO 20 MG TABS tablet TAKE 1 TABLET BY MOUTH ONCE DAILY WITH SUPPER 90 tablet 0  . terazosin (HYTRIN) 10 MG capsule Take 10 mg by mouth daily.    Carlena Hurl. XARELTO 20 MG TABS tablet Take 20 mg by mouth daily.    . Coenzyme Q10 (CO Q 10) 10 MG CAPS Take by mouth. Reported on 02/19/2016    . Insulin Pen Needle (BD ULTRA-FINE PEN NEEDLES) 29G X 12.7MM MISC 1 each by Other route 2 (two) times daily. (Patient not taking: Reported on 04/05/2019) 200 each 2  . Insulin Pen Needle (BD ULTRA-FINE PEN NEEDLES) 29G X 12.7MM MISC USE TWICE DAILY (Patient not taking: Reported on 04/05/2019) 200 each 3  . Nutritional Supplements (GRAPESEED EXTRACT PO) Take 1 capsule by mouth daily.     Facility-Administered Medications Prior to Visit  Medication Dose Route Frequency Provider Last Rate Last Dose  . penicillin g benzathine (BICILLIN LA) 1200000 UNIT/2ML injection 600,000  Units  600,000 Units Intramuscular Q30 days Jeoffrey MassedMcGowen, Camilla Skeen H, MD   600,000 Units at 02/08/19 1313  . penicillin g benzathine (BICILLIN LA) 1200000 UNIT/2ML injection 600,000 Units  600,000 Units Intramuscular Q30 days Jeoffrey MassedMcGowen, Orson Rho H, MD   600,000 Units at 02/08/19 1312  . penicillin g benzathine (BICILLIN LA) 1200000 UNIT/2ML injection 600,000 Units  600,000 Units Intramuscular Q30 days Jeoffrey MassedMcGowen, Mouna Yager H, MD   600,000 Units at 03/12/19 1334  . penicillin g benzathine (BICILLIN LA) 1200000 UNIT/2ML injection 600,000 Units  600,000 Units Intramuscular Q30 days Jeoffrey MassedMcGowen, Marbeth Smedley H, MD   600,000 Units at 03/12/19  1332    Allergies  Allergen Reactions  . Other Other (See Comments)    Sneezing, coughing    ROS As per HPI  PE: Blood pressure 130/84, pulse 67, temperature 98.2 F (36.8 C), temperature source Oral, resp. rate 16, height  (1.778 m), weight (!) 373 lb 6.4 oz (169.4 kg), SpO2 90 %. Gen: Alert, well appearing.  Patient is oriented to person, place, time, and situation. AFFECT: pleasant, lucid thought and speech. KGM:WNUU: no injection, icteris, swelling, or exudate.  EOMI, PERRLA. Mouth: lips without lesion/swelling.  Oral mucosa pink and moist. Oropharynx without erythema, exudate, or swelling.  CV: irreg irreg rhythm, rate 70, no murmur or rub. Chest is clear, no wheezing or rales. Normal symmetric air entry throughout both lung fields. No chest wall deformities or tenderness. EXT: cannot examine LLs/feet b/c of wraps bilat for his venous/lymphedema.  LABS:    Chemistry      Component Value Date/Time   NA 140 12/08/2018 1159   NA 142 01/14/2018 1147   K 4.6 12/08/2018 1159   CL 103 12/08/2018 1159   CO2 29 12/08/2018 1159   BUN 17 12/08/2018 1159   BUN 18 01/14/2018 1147   CREATININE 1.26 12/08/2018 1159   CREATININE 1.02 02/28/2016 1037      Component Value Date/Time   CALCIUM 8.9 12/08/2018 1159   ALKPHOS 65 03/09/2018 1005   AST 14 03/09/2018 1005   ALT 10  03/09/2018 1005   BILITOT 1.3 (H) 03/09/2018 1005     Lab Results  Component Value Date   HGBA1C 6.5 12/08/2018   Lab Results  Component Value Date   CHOL 84 03/09/2018   HDL 34.00 (L) 03/09/2018   LDLCALC 42 03/09/2018   TRIG 41.0 03/09/2018   CHOLHDL 2 03/09/2018   Lab Results  Component Value Date   WBC 6.1 06/08/2018   HGB 12.0 (L) 06/08/2018   HCT 36.2 (L) 06/08/2018   MCV 81.1 06/08/2018   PLT 222.0 06/08/2018    IMPRESSION AND PLAN:  1) DM 2: historically well controlled. Hba1c and urine microalb/cr today. Could not examine feet b/c his LL's and feet are wrapped with UNA boots.  2) HTN: The current medical regimen is effective;  continue present plan and medications. Lytes/cr today.  3) CRI: avoids NSAIDs and tends to have good hydration habits (except the last couple of days). BMET today.  4) Chronic pain syndrome: well controlled with nightly use of vicodin. Controlled substance contract reviewed with patient today.  Patient signed this and it will be placed in the chart.   UDS at next f/u visit.  5) Chronic bilat LL's cellulitis: pen G IM given today as per his monthly routine.  6) Chronic a-fib, on xarelto: monitor CBC today.  An After Visit Summary was printed and given to the patient.  FOLLOW UP: Return in about 3 months (around 07/06/2019) for annual CPE (fasting).  Signed:  Santiago Bumpers, MD           04/05/2019

## 2019-04-06 ENCOUNTER — Telehealth: Payer: Self-pay

## 2019-04-06 NOTE — Telephone Encounter (Signed)
My Chart message sent

## 2019-04-07 ENCOUNTER — Other Ambulatory Visit: Payer: Self-pay

## 2019-04-07 ENCOUNTER — Encounter (HOSPITAL_BASED_OUTPATIENT_CLINIC_OR_DEPARTMENT_OTHER): Payer: Medicare Other | Attending: Physician Assistant

## 2019-04-07 DIAGNOSIS — L97512 Non-pressure chronic ulcer of other part of right foot with fat layer exposed: Secondary | ICD-10-CM | POA: Diagnosis not present

## 2019-04-07 DIAGNOSIS — E1140 Type 2 diabetes mellitus with diabetic neuropathy, unspecified: Secondary | ICD-10-CM | POA: Diagnosis not present

## 2019-04-07 DIAGNOSIS — I87331 Chronic venous hypertension (idiopathic) with ulcer and inflammation of right lower extremity: Secondary | ICD-10-CM | POA: Insufficient documentation

## 2019-04-07 DIAGNOSIS — L97812 Non-pressure chronic ulcer of other part of right lower leg with fat layer exposed: Secondary | ICD-10-CM | POA: Diagnosis not present

## 2019-04-07 DIAGNOSIS — S91104A Unspecified open wound of right lesser toe(s) without damage to nail, initial encounter: Secondary | ICD-10-CM | POA: Diagnosis not present

## 2019-04-07 DIAGNOSIS — I10 Essential (primary) hypertension: Secondary | ICD-10-CM | POA: Diagnosis not present

## 2019-04-07 DIAGNOSIS — Z7901 Long term (current) use of anticoagulants: Secondary | ICD-10-CM | POA: Diagnosis not present

## 2019-04-07 DIAGNOSIS — S81801A Unspecified open wound, right lower leg, initial encounter: Secondary | ICD-10-CM | POA: Diagnosis not present

## 2019-04-07 DIAGNOSIS — I482 Chronic atrial fibrillation, unspecified: Secondary | ICD-10-CM | POA: Diagnosis not present

## 2019-04-07 DIAGNOSIS — M319 Necrotizing vasculopathy, unspecified: Secondary | ICD-10-CM | POA: Diagnosis not present

## 2019-04-07 DIAGNOSIS — E11621 Type 2 diabetes mellitus with foot ulcer: Secondary | ICD-10-CM | POA: Diagnosis not present

## 2019-04-07 DIAGNOSIS — I890 Lymphedema, not elsewhere classified: Secondary | ICD-10-CM | POA: Diagnosis not present

## 2019-04-07 DIAGNOSIS — S81802A Unspecified open wound, left lower leg, initial encounter: Secondary | ICD-10-CM | POA: Diagnosis not present

## 2019-04-14 DIAGNOSIS — L97812 Non-pressure chronic ulcer of other part of right lower leg with fat layer exposed: Secondary | ICD-10-CM | POA: Diagnosis not present

## 2019-04-14 DIAGNOSIS — S91104A Unspecified open wound of right lesser toe(s) without damage to nail, initial encounter: Secondary | ICD-10-CM | POA: Diagnosis not present

## 2019-04-14 DIAGNOSIS — I87331 Chronic venous hypertension (idiopathic) with ulcer and inflammation of right lower extremity: Secondary | ICD-10-CM | POA: Diagnosis not present

## 2019-04-14 DIAGNOSIS — E11621 Type 2 diabetes mellitus with foot ulcer: Secondary | ICD-10-CM | POA: Diagnosis not present

## 2019-04-14 DIAGNOSIS — I890 Lymphedema, not elsewhere classified: Secondary | ICD-10-CM | POA: Diagnosis not present

## 2019-04-14 DIAGNOSIS — S81801A Unspecified open wound, right lower leg, initial encounter: Secondary | ICD-10-CM | POA: Diagnosis not present

## 2019-04-14 DIAGNOSIS — L97512 Non-pressure chronic ulcer of other part of right foot with fat layer exposed: Secondary | ICD-10-CM | POA: Diagnosis not present

## 2019-04-14 DIAGNOSIS — E1140 Type 2 diabetes mellitus with diabetic neuropathy, unspecified: Secondary | ICD-10-CM | POA: Diagnosis not present

## 2019-04-19 ENCOUNTER — Telehealth: Payer: Self-pay | Admitting: Family Medicine

## 2019-04-19 ENCOUNTER — Other Ambulatory Visit: Payer: Self-pay | Admitting: Family Medicine

## 2019-04-19 MED ORDER — HYDROCODONE-ACETAMINOPHEN 5-325 MG PO TABS: ORAL_TABLET | ORAL | 0 refills | Status: DC

## 2019-04-19 NOTE — Telephone Encounter (Signed)
Patient advised of RX at pharmacy.  

## 2019-04-19 NOTE — Telephone Encounter (Signed)
Patient requesting RF of hydrocodone.  Last OV 04/05/2019 Next OV 07/06/2019 Last RX 02/17/2019 # 60 x 0 RF  Please advise.

## 2019-04-19 NOTE — Telephone Encounter (Signed)
Patient contacted pharmacy, they advised that he contact our office to request Hydrocodone refill. The pharmacy did agree to send a request for insulin pens.

## 2019-04-21 DIAGNOSIS — E1140 Type 2 diabetes mellitus with diabetic neuropathy, unspecified: Secondary | ICD-10-CM | POA: Diagnosis not present

## 2019-04-21 DIAGNOSIS — I87331 Chronic venous hypertension (idiopathic) with ulcer and inflammation of right lower extremity: Secondary | ICD-10-CM | POA: Diagnosis not present

## 2019-04-21 DIAGNOSIS — E11621 Type 2 diabetes mellitus with foot ulcer: Secondary | ICD-10-CM | POA: Diagnosis not present

## 2019-04-21 DIAGNOSIS — I890 Lymphedema, not elsewhere classified: Secondary | ICD-10-CM | POA: Diagnosis not present

## 2019-04-21 DIAGNOSIS — L97512 Non-pressure chronic ulcer of other part of right foot with fat layer exposed: Secondary | ICD-10-CM | POA: Diagnosis not present

## 2019-04-21 DIAGNOSIS — L97812 Non-pressure chronic ulcer of other part of right lower leg with fat layer exposed: Secondary | ICD-10-CM | POA: Diagnosis not present

## 2019-04-21 DIAGNOSIS — L97519 Non-pressure chronic ulcer of other part of right foot with unspecified severity: Secondary | ICD-10-CM | POA: Diagnosis not present

## 2019-04-22 DIAGNOSIS — I87312 Chronic venous hypertension (idiopathic) with ulcer of left lower extremity: Secondary | ICD-10-CM | POA: Diagnosis not present

## 2019-04-27 ENCOUNTER — Other Ambulatory Visit: Payer: Self-pay

## 2019-04-27 ENCOUNTER — Ambulatory Visit (INDEPENDENT_AMBULATORY_CARE_PROVIDER_SITE_OTHER): Payer: Medicare Other | Admitting: Infectious Diseases

## 2019-04-27 DIAGNOSIS — L03119 Cellulitis of unspecified part of limb: Secondary | ICD-10-CM | POA: Diagnosis not present

## 2019-04-27 DIAGNOSIS — E1165 Type 2 diabetes mellitus with hyperglycemia: Secondary | ICD-10-CM | POA: Diagnosis not present

## 2019-04-27 DIAGNOSIS — I890 Lymphedema, not elsewhere classified: Secondary | ICD-10-CM | POA: Diagnosis not present

## 2019-04-27 NOTE — Progress Notes (Signed)
Name: [name]  DOB: Aug 20, 1951  MRN: [id number]  PCP: Tammi Sou, MD  Referring Provider: Dr. Dellia Nims    Patient Active Problem List   Diagnosis Date Noted  . Uncontrolled type 2 diabetes mellitus with hyperglycemia ([place]) 03/04/2018  . Chronic diastolic heart failure ([place]) 12/24/2016  . Lymphedema 02/22/2016  . Diabetes with neurologic complications ([place]) 34/74/2595  . Chronic headaches 05/10/2015  . Equivocal stress test 10/24/2014  . Atrial fibrillation ([place]) 08/15/2014  . Healthcare maintenance 08/15/2014  . Type 2 diabetes mellitus with diabetic neuropathy ([place]) 08/15/2014  . 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 Recurrent cellulitis of lower extremity 08/02/2013  . Lymphedema of both lower extremities 11/04/2008  . Morbid obesity due to excess calories ([place]) 12/14/2007  . Venous (peripheral) insufficiency 07/22/2007  . Diabetic renal disease ([place]) 04/20/2007  . DM2 (diabetes mellitus, type 2) ([place]) 08/11/2006  . Hypertension, essential 08/11/2006  . ALLERGIC RHINITIS 08/11/2006  . ASTHMA 08/11/2006  . Asthma 08/11/2006   Subjective:  Brief Narrative:   [name] is a 68 y.o. male referred for evaluation of his recurrent cellulitis of his lower extremities. He has been followed closely by the [facility] since March 2017. He has a long standing history of morbid obesity, diabetes requiring insulin, B/L venous stasis with ulcerations, chronic lower extremity swelling / lymphadenopathy. ABI in April 2017 did not require intervention and LE venous dopplers 10/2016 without deep or superficial thrombus. Previously we tried prophylactic Amoxicillin and Clindamycin without effect. He has been doing well on Bicillin 1.2 million units IM Q4w.   CC:  Follow up on recurrent lower extremity cellulitis infections on prophylactic bicillin injections. No complaints today. Feels like her shot plan is working.  HPI: He tells me the right leg is no longer  requiring an Haematologist and he is using a specific kind of orthopedic/compression device.  He has no current trouble with his right leg and this 1 typically does better than the left.  His left wound which is usually his problem leg has been much improved since routine and consistent administration of every 4 week Bicillin injections.  He cannot recall the last time he was on an oral antibiotic to treat a flare and thinks he does not have any open areas currently but would like me to check.  He is due to go to the wound clinic tomorrow to follow-up with Jeri Cos, PA.  He tells me they are trying to get him arranged to see alymphedema clinic to help with management of his chronic and likely progressed/severe lymphedema.  He still going for occasional wraps of the left leg and feels like they have been falling down causing some skin irritation up to the upper calf/knee area but overall he has been very pleased with the results and thankful he has been able to avoid further antibiotics.   Review of Systems  Constitutional: Negative for chills, fever, malaise/fatigue and weight loss.  HENT: Negative for sore throat.   Respiratory: Negative for cough and sputum production.   Cardiovascular: Positive for leg swelling. Negative for chest pain, palpitations and claudication.  Gastrointestinal: Negative for abdominal pain, diarrhea and vomiting.  Genitourinary: Negative for dysuria and flank pain.  Musculoskeletal: Positive for joint pain.  Skin: Negative for rash.  Neurological: Negative for dizziness and headaches.    Past Medical History:  Diagnosis Date  . ALLERGIC RHINITIS 08/11/2006  . ASTHMA 08/11/2006  . Chronic atrial fibrillation 08/2014  . Chronic combined  systolic and diastolic CHF (congestive heart failure) [facility](HCC)    Cardiology f/u 12/2017: pt volume overloaded (R heart dysf suspected), BNP very high, lasix increased.  Repeat echo 12/2017: normal LV EF, mild DD, +RV syst dysfxn, mod pulm HTN,  biatrial enlgmt.  . Chronic constipation   . Chronic renal insufficiency, stage 2 (mild)    Borderline stage III (GFR 60s).  . DIABETES MELLITUS, TYPE II 08/11/2006  . DM W/RENAL MANIFESTATIONS, TYPE II 04/20/2007  . HYPERTENSION 08/11/2006  . MRSA infection 06/2018   LL venous stasis ulcer infected  . Normocytic anemia 2016-2019   03/2018 B12 normal, iron ok (ferritin borderline low).  Plan repeat CBC at f/u summer 2019 and if decreased from baseline will check hemoccults and iron labs again.  . OBESITY, MORBID 12/14/2007  . OSA (obstructive sleep apnea)    to get sleep study with [facility] as of 12/03/2018 consult.  . 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  [id number] ID started him on amoxil prophylaxis---ineffective.  End 2018/Jan 2019 penicillin G injections prophyl helpful but pt declined to continue this as of 01/2018 ID f/u.  ID talked him into resuming monthly penicillin G as of 02/2018 f/u.    Marland Kitchen. Restless leg syndrome    Rx'd clonazepam 09/2017 and pt refused to take it after reading the medication's potential side effects.  . Venous stasis ulcers of both lower extremities (HCC)    Severe lymphedema.  wound clinic care ongoing as of 01/2018   Outpatient Medications Prior to Visit  Medication Sig Dispense Refill  . b complex vitamins capsule Take 1 capsule by mouth every morning.    . butalbital-acetaminophen-caffeine (FIORICET WITH CODEINE) 50-325-40-30 MG capsule TAKE 1 TO 2 CAPSULES BY  MOUTH EVERY 6 HOURS AS  NEEDED FOR HEADACHE(S)  MANUFACTURER RECOMMENDS NOT EXCEEDING 6 CAPSULES/DAY 60 capsule 1  . cetirizine (ZYRTEC) 10 MG tablet Take 10 mg by mouth daily.    . Cinnamon 500 MG TABS Take by mouth. Reported on 02/19/2016    . Coenzyme Q10 (CO Q 10) 10 MG CAPS Take by mouth. Reported on 02/19/2016    . diltiazem (TIAZAC) 240 MG 24 hr capsule Take 1 capsule (240 mg total) by mouth daily. 90 capsule 1  .  furosemide (LASIX) 40 MG tablet TAKE 2 TABLETS BY MOUTH TWO TIMES DAILY 360 tablet 1  . glucose blood (ONE TOUCH ULTRA TEST) test strip CHECK BLOOD SUGAR TWO TIMES DAILY 100 each 11  . HUMALOG MIX 75/25 KWIKPEN (75-25) 100 UNIT/ML Kwikpen INJECT SUBCUTANEOUSLY 25  UNITS EVERY MORNING AND 20  UNITS EVERY EVENING AT  SUPPER 45 mL 0  . HYDROcodone-acetaminophen (NORCO/VICODIN) 5-325 MG tablet 1-2 tabs po bid prn pain 60 tablet 0  . Insulin Pen Needle (BD ULTRA-FINE PEN NEEDLES) 29G X 12.7MM MISC 1 each by Other route 2 (two) times daily. 200 each 2  . Insulin Pen Needle (BD ULTRA-FINE PEN NEEDLES) 29G X 12.7MM MISC USE TWICE DAILY 200 each 3  . Insulin Pen Needle (RELION PEN NEEDLE 31G/8MM) 31G X 8 MM MISC USE 1  TWICE DAILY 50 each 0  . lisinopril (PRINIVIL,ZESTRIL) 40 MG tablet TAKE 1 TABLET BY MOUTH  EVERY DAY 90 tablet 1  . metFORMIN (GLUCOPHAGE) 1000 MG tablet TAKE 1 TABLET BY MOUTH  TWICE A DAY WITH MEALS 180 tablet 1  . metoprolol tartrate (LOPRESSOR) 50 MG tablet Take 25 mg by mouth 2 (two) times daily.    .Marland Kitchen  Multiple Vitamin (MULTIVITAMIN) tablet Take 1 tablet by mouth daily.    . saw palmetto 500 MG capsule Take 500 mg by mouth daily.    Marland Kitchen. terazosin (HYTRIN) 10 MG capsule TAKE 1 CAPSULE BY MOUTH  ONCE A DAY AT BEDTIME 90 capsule 1  . vitamin E 400 UNIT capsule Take 400 Units by mouth every morning. Selenium 50mg     . XARELTO 20 MG TABS tablet TAKE 1 TABLET BY MOUTH ONCE DAILY WITH SUPPER 90 tablet 0   Facility-Administered Medications Prior to Visit  Medication Dose Route Frequency Provider Last Rate Last Dose  . penicillin g benzathine (BICILLIN LA) 1200000 UNIT/2ML injection 600,000 Units  600,000 Units Intramuscular Q30 days Jeoffrey MassedMcGowen, [name] H, MD   600,000 Units at 04/05/19 0905  . penicillin g benzathine (BICILLIN LA) 1200000 UNIT/2ML injection 600,000 Units  600,000 Units Intramuscular Q30 days Jeoffrey MassedMcGowen, [name] H, MD   600,000 Units at 04/05/19 0905  . penicillin g benzathine (BICILLIN  LA) 1200000 UNIT/2ML injection 600,000 Units  600,000 Units Intramuscular Q30 days Jeoffrey MassedMcGowen, [name] H, MD   600,000 Units at 03/12/19 1334  . penicillin g benzathine (BICILLIN LA) 1200000 UNIT/2ML injection 600,000 Units  600,000 Units Intramuscular Q30 days Jeoffrey MassedMcGowen, [name] H, MD   600,000 Units at 03/12/19 1332   Allergies  Allergen Reactions  . Other Other (See Comments)    Sneezing, coughing    Physical Exam and Objective Findings: Vitals:   04/27/19 1417  BP: (!) 148/73  Pulse: (!) 45  Temp: (!) 97.4 F (36.3 C)  TempSrc: Oral  Weight: (!) 361 lb (163.7 kg)   Body mass index is 51.8 kg/m.  Physical Exam  Constitutional: He is oriented to person, place, and time and well-developed, well-nourished, and in no distress.  HENT:  Mouth/Throat: No oropharyngeal exudate.  Cardiovascular: Regular rhythm and normal heart sounds. Bradycardia present.  Pulmonary/Chest: Effort normal.  Musculoskeletal:     Comments: Uses a walker  Neurological: He is alert and oriented to person, place, and time.  Skin: Skin is warm and dry.  Psychiatric: Affect normal.   R Leg maintained in a compression device.  No ascending erythema.  L Leg dressing was taken down.  He has chronic changes to the skin color and texture associated with lymphedema/venous stasis but outside of this I do not see any signs of active infection.  He has no pain or tenderness to palpation.  Multiple yellow plaques of dry skin that shed but no open areas from the inspection I did today.  Chronic fungal nail infection.  Overall the dressings are quite dry today.   ASSESSMENT & PLAN:  Problem List Items Addressed This Visit      Unprioritized   Lymphedema    He has signs of advanced age lymphedema both legs.  I think referral to lymphedema clinic is a great idea to assist with yet another 1 of his risk factors for recurrent cellulitis.  He has many.      Recurrent cellulitis of lower extremity    I am very pleased with  the progress that Verdon CumminsJesse has made both with persistent wound care and consistency with taking the Bicillin injections.  He has had no missed appointments in the last 6 months since his last office visit with me.  I think this is paying off.  I would like to continue this for another 6 months and see him back in December to reevaluate.  We will discuss at that visit whether we should continue further  or stop to see how he does.  If he gets into the lymphedema clinic and get some of the fluid management under better control we may be able to realistically consider this.  Very much appreciate the assistance of his primary care provider Dr. Milinda CaveMcGowen and his staff to administer these injections.      Uncontrolled type 2 diabetes mellitus with hyperglycemia (HCC)    Lab Results  Component Value Date   HGBA1C 7.2 (H) 04/05/2019   His diabetes is under decent control with a hemoglobin A1c just above target at 7.2%.        Return in about 6 months (around 10/27/2019).  Rexene AlbertsStephanie Dixon, MSN, [facility] HospitalFNP-C [facility]  04/28/2019

## 2019-04-27 NOTE — Patient Instructions (Addendum)
I am so glad you have had a good result with your leg since we have started regular injections to prevent cellulitis for you.   Please keep up the good work with Margarita Grizzle and Dr. Dellia Nims.   Don't give up on the lymphedema clinic! I think it will be very helpful for you and would like to see this happen for you.   Please continue getting your shots with Dr. Idelle Leech team as you have been and we will see you back in 6 months. It may be that we can after some good work with the lymphedema clinic consider stopping these injections to see how you do in the future. That is my hope for you.   In the mean time - continue to be well and take care of yourself!

## 2019-04-28 ENCOUNTER — Encounter: Payer: Self-pay | Admitting: Infectious Diseases

## 2019-04-28 DIAGNOSIS — L97512 Non-pressure chronic ulcer of other part of right foot with fat layer exposed: Secondary | ICD-10-CM | POA: Diagnosis not present

## 2019-04-28 DIAGNOSIS — E11621 Type 2 diabetes mellitus with foot ulcer: Secondary | ICD-10-CM | POA: Diagnosis not present

## 2019-04-28 DIAGNOSIS — L97229 Non-pressure chronic ulcer of left calf with unspecified severity: Secondary | ICD-10-CM | POA: Diagnosis not present

## 2019-04-28 DIAGNOSIS — L97812 Non-pressure chronic ulcer of other part of right lower leg with fat layer exposed: Secondary | ICD-10-CM | POA: Diagnosis not present

## 2019-04-28 DIAGNOSIS — E1140 Type 2 diabetes mellitus with diabetic neuropathy, unspecified: Secondary | ICD-10-CM | POA: Diagnosis not present

## 2019-04-28 DIAGNOSIS — I87331 Chronic venous hypertension (idiopathic) with ulcer and inflammation of right lower extremity: Secondary | ICD-10-CM | POA: Diagnosis not present

## 2019-04-28 DIAGNOSIS — L97819 Non-pressure chronic ulcer of other part of right lower leg with unspecified severity: Secondary | ICD-10-CM | POA: Diagnosis not present

## 2019-04-28 DIAGNOSIS — E11622 Type 2 diabetes mellitus with other skin ulcer: Secondary | ICD-10-CM | POA: Diagnosis not present

## 2019-04-28 DIAGNOSIS — I890 Lymphedema, not elsewhere classified: Secondary | ICD-10-CM | POA: Diagnosis not present

## 2019-04-28 NOTE — Assessment & Plan Note (Signed)
Lab Results  Component Value Date   HGBA1C 7.2 (H) 04/05/2019   His diabetes is under decent control with a hemoglobin A1c just above target at 7.2%.

## 2019-04-28 NOTE — Assessment & Plan Note (Addendum)
I am very pleased with the progress that Ridge has made both with persistent wound care and consistency with taking the Bicillin injections.  He has had no missed appointments in the last 6 months since his last office visit with me.  I think this is paying off.  I would like to continue this for another 6 months and see him back in December to reevaluate.  We will discuss at that visit whether we should continue further or stop to see how he does.  If he gets into the lymphedema clinic and get some of the fluid management under better control we may be able to realistically consider this.  Very much appreciate the assistance of his primary care provider Dr. Anitra Lauth and his staff to administer these injections.

## 2019-04-28 NOTE — Assessment & Plan Note (Signed)
He has signs of advanced age lymphedema both legs.  I think referral to lymphedema clinic is a great idea to assist with yet another 1 of his risk factors for recurrent cellulitis.  He has many.

## 2019-05-05 ENCOUNTER — Ambulatory Visit (INDEPENDENT_AMBULATORY_CARE_PROVIDER_SITE_OTHER): Payer: Medicare Other | Admitting: Family Medicine

## 2019-05-05 ENCOUNTER — Encounter (HOSPITAL_BASED_OUTPATIENT_CLINIC_OR_DEPARTMENT_OTHER): Payer: Medicare Other | Attending: Internal Medicine

## 2019-05-05 ENCOUNTER — Other Ambulatory Visit: Payer: Self-pay

## 2019-05-05 DIAGNOSIS — L97822 Non-pressure chronic ulcer of other part of left lower leg with fat layer exposed: Secondary | ICD-10-CM | POA: Insufficient documentation

## 2019-05-05 DIAGNOSIS — L97811 Non-pressure chronic ulcer of other part of right lower leg limited to breakdown of skin: Secondary | ICD-10-CM | POA: Insufficient documentation

## 2019-05-05 DIAGNOSIS — L97529 Non-pressure chronic ulcer of other part of left foot with unspecified severity: Secondary | ICD-10-CM | POA: Insufficient documentation

## 2019-05-05 DIAGNOSIS — I87333 Chronic venous hypertension (idiopathic) 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: Secondary | ICD-10-CM | POA: Diagnosis not present

## 2019-05-05 DIAGNOSIS — L97229 Non-pressure chronic ulcer of left calf with unspecified severity: Secondary | ICD-10-CM | POA: Diagnosis not present

## 2019-05-05 DIAGNOSIS — I890 Lymphedema, not elsewhere classified: Secondary | ICD-10-CM | POA: Insufficient documentation

## 2019-05-05 DIAGNOSIS — L97812 Non-pressure chronic ulcer of other part of right lower leg with fat layer exposed: Secondary | ICD-10-CM | POA: Insufficient documentation

## 2019-05-05 DIAGNOSIS — L97221 Non-pressure chronic ulcer of left calf limited to breakdown of skin: Secondary | ICD-10-CM | POA: Insufficient documentation

## 2019-05-05 DIAGNOSIS — E1140 Type 2 diabetes mellitus with diabetic neuropathy, unspecified: Secondary | ICD-10-CM | POA: Insufficient documentation

## 2019-05-05 DIAGNOSIS — L03119 Cellulitis of unspecified part of limb: Secondary | ICD-10-CM

## 2019-05-05 DIAGNOSIS — I87313 Chronic venous hypertension (idiopathic) with ulcer of bilateral lower extremity: Secondary | ICD-10-CM | POA: Diagnosis not present

## 2019-05-05 DIAGNOSIS — E11622 Type 2 diabetes mellitus with other skin ulcer: Secondary | ICD-10-CM | POA: Insufficient documentation

## 2019-05-05 DIAGNOSIS — L97219 Non-pressure chronic ulcer of right calf with unspecified severity: Secondary | ICD-10-CM | POA: Diagnosis not present

## 2019-05-05 DIAGNOSIS — L97519 Non-pressure chronic ulcer of other part of right foot with unspecified severity: Secondary | ICD-10-CM | POA: Insufficient documentation

## 2019-05-05 DIAGNOSIS — L03116 Cellulitis of left lower limb: Secondary | ICD-10-CM

## 2019-05-05 NOTE — Progress Notes (Signed)
[name] is a 68 y.o. male presents to the office today for Bicillin 120,060ml injections, per physician's orders.  Bicillin 600,000 ml IM was administered on in both right and left upper outter quads today. Patient tolerated both injections well.   Patient next injection due: 06/05/2019, appt made Yes 06/04/2019  Kathie Dike., CMA

## 2019-05-06 ENCOUNTER — Ambulatory Visit: Payer: Medicare Other

## 2019-05-12 DIAGNOSIS — L97812 Non-pressure chronic ulcer of other part of right lower leg with fat layer exposed: Secondary | ICD-10-CM | POA: Diagnosis not present

## 2019-05-12 DIAGNOSIS — I890 Lymphedema, not elsewhere classified: Secondary | ICD-10-CM | POA: Diagnosis not present

## 2019-05-12 DIAGNOSIS — L97822 Non-pressure chronic ulcer of other part of left lower leg with fat layer exposed: Secondary | ICD-10-CM | POA: Diagnosis not present

## 2019-05-12 DIAGNOSIS — L97221 Non-pressure chronic ulcer of left calf limited to breakdown of skin: Secondary | ICD-10-CM | POA: Diagnosis not present

## 2019-05-12 DIAGNOSIS — L97519 Non-pressure chronic ulcer of other part of right foot with unspecified severity: Secondary | ICD-10-CM | POA: Diagnosis not present

## 2019-05-12 DIAGNOSIS — L97819 Non-pressure chronic ulcer of other part of right lower leg with unspecified severity: Secondary | ICD-10-CM | POA: Diagnosis not present

## 2019-05-12 DIAGNOSIS — L97811 Non-pressure chronic ulcer of other part of right lower leg limited to breakdown of skin: Secondary | ICD-10-CM | POA: Diagnosis not present

## 2019-05-12 DIAGNOSIS — E11622 Type 2 diabetes mellitus with other skin ulcer: Secondary | ICD-10-CM | POA: Diagnosis not present

## 2019-05-12 DIAGNOSIS — E1140 Type 2 diabetes mellitus with diabetic neuropathy, unspecified: Secondary | ICD-10-CM | POA: Diagnosis not present

## 2019-05-12 DIAGNOSIS — I87333 Chronic venous hypertension (idiopathic) 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: Secondary | ICD-10-CM | POA: Diagnosis not present

## 2019-05-12 DIAGNOSIS — L97529 Non-pressure chronic ulcer of other part of left foot with unspecified severity: Secondary | ICD-10-CM | POA: Diagnosis not present

## 2019-05-12 DIAGNOSIS — L97229 Non-pressure chronic ulcer of left calf with unspecified severity: Secondary | ICD-10-CM | POA: Diagnosis not present

## 2019-05-12 DIAGNOSIS — I87313 Chronic venous hypertension (idiopathic) with ulcer of bilateral lower extremity: Secondary | ICD-10-CM | POA: Diagnosis not present

## 2019-05-17 NOTE — Progress Notes (Signed)
Per ID rec's, pt to get monthly Pen G injections. I agree with these injections done today. Signed:  Crissie Sickles, MD           05/17/2019

## 2019-05-19 DIAGNOSIS — L97221 Non-pressure chronic ulcer of left calf limited to breakdown of skin: Secondary | ICD-10-CM | POA: Diagnosis not present

## 2019-05-19 DIAGNOSIS — I890 Lymphedema, not elsewhere classified: Secondary | ICD-10-CM | POA: Diagnosis not present

## 2019-05-19 DIAGNOSIS — E1140 Type 2 diabetes mellitus with diabetic neuropathy, unspecified: Secondary | ICD-10-CM | POA: Diagnosis not present

## 2019-05-19 DIAGNOSIS — L97819 Non-pressure chronic ulcer of other part of right lower leg with unspecified severity: Secondary | ICD-10-CM | POA: Diagnosis not present

## 2019-05-19 DIAGNOSIS — L97822 Non-pressure chronic ulcer of other part of left lower leg with fat layer exposed: Secondary | ICD-10-CM | POA: Diagnosis not present

## 2019-05-19 DIAGNOSIS — L97229 Non-pressure chronic ulcer of left calf with unspecified severity: Secondary | ICD-10-CM | POA: Diagnosis not present

## 2019-05-19 DIAGNOSIS — L97529 Non-pressure chronic ulcer of other part of left foot with unspecified severity: Secondary | ICD-10-CM | POA: Diagnosis not present

## 2019-05-19 DIAGNOSIS — L97212 Non-pressure chronic ulcer of right calf with fat layer exposed: Secondary | ICD-10-CM | POA: Diagnosis not present

## 2019-05-19 DIAGNOSIS — L97519 Non-pressure chronic ulcer of other part of right foot with unspecified severity: Secondary | ICD-10-CM | POA: Diagnosis not present

## 2019-05-19 DIAGNOSIS — E11622 Type 2 diabetes mellitus with other skin ulcer: Secondary | ICD-10-CM | POA: Diagnosis not present

## 2019-05-19 DIAGNOSIS — L97811 Non-pressure chronic ulcer of other part of right lower leg limited to breakdown of skin: Secondary | ICD-10-CM | POA: Diagnosis not present

## 2019-05-19 DIAGNOSIS — I87333 Chronic venous hypertension (idiopathic) 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: Secondary | ICD-10-CM | POA: Diagnosis not present

## 2019-05-19 DIAGNOSIS — L97812 Non-pressure chronic ulcer of other part of right lower leg with fat layer exposed: Secondary | ICD-10-CM | POA: Diagnosis not present

## 2019-05-26 DIAGNOSIS — L97519 Non-pressure chronic ulcer of other part of right foot with unspecified severity: Secondary | ICD-10-CM | POA: Diagnosis not present

## 2019-05-26 DIAGNOSIS — I87333 Chronic venous hypertension (idiopathic) 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: Secondary | ICD-10-CM | POA: Diagnosis not present

## 2019-05-26 DIAGNOSIS — L97812 Non-pressure chronic ulcer of other part of right lower leg with fat layer exposed: Secondary | ICD-10-CM | POA: Diagnosis not present

## 2019-05-26 DIAGNOSIS — I87311 Chronic venous hypertension (idiopathic) with ulcer of right lower extremity: Secondary | ICD-10-CM | POA: Diagnosis not present

## 2019-05-26 DIAGNOSIS — L97529 Non-pressure chronic ulcer of other part of left foot with unspecified severity: Secondary | ICD-10-CM | POA: Diagnosis not present

## 2019-05-26 DIAGNOSIS — L97221 Non-pressure chronic ulcer of left calf limited to breakdown of skin: Secondary | ICD-10-CM | POA: Diagnosis not present

## 2019-05-26 DIAGNOSIS — E1140 Type 2 diabetes mellitus with diabetic neuropathy, unspecified: Secondary | ICD-10-CM | POA: Diagnosis not present

## 2019-05-26 DIAGNOSIS — I890 Lymphedema, not elsewhere classified: Secondary | ICD-10-CM | POA: Diagnosis not present

## 2019-05-26 DIAGNOSIS — L97811 Non-pressure chronic ulcer of other part of right lower leg limited to breakdown of skin: Secondary | ICD-10-CM | POA: Diagnosis not present

## 2019-05-26 DIAGNOSIS — L97819 Non-pressure chronic ulcer of other part of right lower leg with unspecified severity: Secondary | ICD-10-CM | POA: Diagnosis not present

## 2019-05-26 DIAGNOSIS — L97822 Non-pressure chronic ulcer of other part of left lower leg with fat layer exposed: Secondary | ICD-10-CM | POA: Diagnosis not present

## 2019-05-26 DIAGNOSIS — E11622 Type 2 diabetes mellitus with other skin ulcer: Secondary | ICD-10-CM | POA: Diagnosis not present

## 2019-05-26 DIAGNOSIS — L97212 Non-pressure chronic ulcer of right calf with fat layer exposed: Secondary | ICD-10-CM | POA: Diagnosis not present

## 2019-05-28 ENCOUNTER — Telehealth: Payer: Self-pay | Admitting: Internal Medicine

## 2019-05-28 NOTE — Telephone Encounter (Signed)
° ° °  COVID-19 Pre-Screening Questions: ° °• In the past 7 to 10 days have you had a cough,  shortness of breath, headache, congestion, fever (100 or greater) body aches, chills, sore throat, or sudden loss of taste or sense of smell? no °• Have you been around anyone with known Covid 19. °• Have you been around anyone who is awaiting Covid 19 test results in the past 7 to 10 days? no noHave you been around anyone who has been exposed to Covid 19, or has mentioned symptoms of Covid 19 within the past 7 to 10 days? no ° °If you have any concerns/questions about symptoms patients report during screening (either on the phone or at threshold). Contact the provider seeing the patient or DOD for further guidance.  If neither are available contact a member of the leadership team. ° ° ° °   ° ° ° ° ° °

## 2019-05-28 NOTE — Telephone Encounter (Signed)
[name],  reminding pt of his appt with Dr Debara Pickett on 05-31-19. I ask pt to to call back to be pre-screened for COVID-19.

## 2019-05-31 ENCOUNTER — Ambulatory Visit: Payer: Medicare Other | Admitting: Internal Medicine

## 2019-06-02 DIAGNOSIS — I87333 Chronic venous hypertension (idiopathic) 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: Secondary | ICD-10-CM | POA: Diagnosis not present

## 2019-06-02 DIAGNOSIS — L97221 Non-pressure chronic ulcer of left calf limited to breakdown of skin: Secondary | ICD-10-CM | POA: Diagnosis not present

## 2019-06-02 DIAGNOSIS — E1140 Type 2 diabetes mellitus with diabetic neuropathy, unspecified: Secondary | ICD-10-CM | POA: Diagnosis not present

## 2019-06-02 DIAGNOSIS — L97811 Non-pressure chronic ulcer of other part of right lower leg limited to breakdown of skin: Secondary | ICD-10-CM | POA: Diagnosis not present

## 2019-06-02 DIAGNOSIS — I890 Lymphedema, not elsewhere classified: Secondary | ICD-10-CM | POA: Diagnosis not present

## 2019-06-02 DIAGNOSIS — L97212 Non-pressure chronic ulcer of right calf with fat layer exposed: Secondary | ICD-10-CM | POA: Diagnosis not present

## 2019-06-02 DIAGNOSIS — L97819 Non-pressure chronic ulcer of other part of right lower leg with unspecified severity: Secondary | ICD-10-CM | POA: Diagnosis not present

## 2019-06-02 DIAGNOSIS — L97529 Non-pressure chronic ulcer of other part of left foot with unspecified severity: Secondary | ICD-10-CM | POA: Diagnosis not present

## 2019-06-02 DIAGNOSIS — I87313 Chronic venous hypertension (idiopathic) with ulcer of bilateral lower extremity: Secondary | ICD-10-CM | POA: Diagnosis not present

## 2019-06-02 DIAGNOSIS — E11622 Type 2 diabetes mellitus with other skin ulcer: Secondary | ICD-10-CM | POA: Diagnosis not present

## 2019-06-02 DIAGNOSIS — L97519 Non-pressure chronic ulcer of other part of right foot with unspecified severity: Secondary | ICD-10-CM | POA: Diagnosis not present

## 2019-06-02 DIAGNOSIS — L97812 Non-pressure chronic ulcer of other part of right lower leg with fat layer exposed: Secondary | ICD-10-CM | POA: Diagnosis not present

## 2019-06-02 DIAGNOSIS — L97822 Non-pressure chronic ulcer of other part of left lower leg with fat layer exposed: Secondary | ICD-10-CM | POA: Diagnosis not present

## 2019-06-03 DIAGNOSIS — E119 Type 2 diabetes mellitus without complications: Secondary | ICD-10-CM | POA: Diagnosis not present

## 2019-06-04 ENCOUNTER — Ambulatory Visit (INDEPENDENT_AMBULATORY_CARE_PROVIDER_SITE_OTHER): Payer: Medicare Other

## 2019-06-04 ENCOUNTER — Other Ambulatory Visit: Payer: Self-pay

## 2019-06-04 DIAGNOSIS — L03116 Cellulitis of left lower limb: Secondary | ICD-10-CM | POA: Diagnosis not present

## 2019-06-04 MED ORDER — PENICILLIN G BENZATHINE 1200000 UNIT/2ML IM SUSP
600000.0000 [IU] | Freq: Once | INTRAMUSCULAR | Status: AC
  Administered 2019-06-04: 11:00:00 600000 [IU] via INTRAMUSCULAR

## 2019-06-04 MED ORDER — PENICILLIN G BENZATHINE 1200000 UNIT/2ML IM SUSP
600000.0000 [IU] | Freq: Once | INTRAMUSCULAR | Status: AC
  Administered 2019-06-04: 600000 [IU] via INTRAMUSCULAR

## 2019-06-04 MED ORDER — PENICILLIN G BENZATHINE 1200000 UNIT/2ML IM SUSP: 1.2000 10*6.[IU] | Freq: Once | INTRAMUSCULAR | Status: DC

## 2019-06-04 NOTE — Progress Notes (Signed)
[name] is a 68 y.o. male presents to the office today for monthly Bicillin injections, per physician's orders. Per PCP order, Bicillin 600,000 units/ml (x2) was administered in each upper outer quadrant today. Patient tolerated injection. Patient due for follow up labs/provider appt: Yes. Date due: 07/06/2019, appt made Yes Patient next injection due: 07/06/2019, appt made Yes  [name]

## 2019-06-05 ENCOUNTER — Other Ambulatory Visit: Payer: Self-pay | Admitting: Family Medicine

## 2019-06-09 ENCOUNTER — Other Ambulatory Visit: Payer: Self-pay

## 2019-06-09 ENCOUNTER — Encounter (HOSPITAL_BASED_OUTPATIENT_CLINIC_OR_DEPARTMENT_OTHER): Payer: Medicare Other | Attending: Internal Medicine

## 2019-06-09 DIAGNOSIS — R21 Rash and other nonspecific skin eruption: Secondary | ICD-10-CM | POA: Diagnosis not present

## 2019-06-09 DIAGNOSIS — I10 Essential (primary) hypertension: Secondary | ICD-10-CM | POA: Diagnosis not present

## 2019-06-09 DIAGNOSIS — E119 Type 2 diabetes mellitus without complications: Secondary | ICD-10-CM | POA: Diagnosis not present

## 2019-06-09 DIAGNOSIS — I890 Lymphedema, not elsewhere classified: Secondary | ICD-10-CM | POA: Insufficient documentation

## 2019-06-09 DIAGNOSIS — L97529 Non-pressure chronic ulcer of other part of left foot with unspecified severity: Secondary | ICD-10-CM | POA: Diagnosis not present

## 2019-06-09 DIAGNOSIS — I87333 Chronic venous hypertension (idiopathic) 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: Secondary | ICD-10-CM | POA: Diagnosis not present

## 2019-06-09 DIAGNOSIS — L97219 Non-pressure chronic ulcer of right calf with unspecified severity: Secondary | ICD-10-CM | POA: Diagnosis not present

## 2019-06-09 DIAGNOSIS — L97212 Non-pressure chronic ulcer of right calf with fat layer exposed: Secondary | ICD-10-CM | POA: Insufficient documentation

## 2019-06-09 DIAGNOSIS — L97322 Non-pressure chronic ulcer of left ankle with fat layer exposed: Secondary | ICD-10-CM | POA: Insufficient documentation

## 2019-06-09 DIAGNOSIS — Z794 Long term (current) use of insulin: Secondary | ICD-10-CM | POA: Diagnosis not present

## 2019-06-09 DIAGNOSIS — I87313 Chronic venous hypertension (idiopathic) with ulcer of bilateral lower extremity: Secondary | ICD-10-CM | POA: Diagnosis not present

## 2019-06-15 ENCOUNTER — Telehealth: Payer: Self-pay | Admitting: Family Medicine

## 2019-06-15 ENCOUNTER — Other Ambulatory Visit: Payer: Self-pay | Admitting: Family Medicine

## 2019-06-15 ENCOUNTER — Other Ambulatory Visit: Payer: Self-pay

## 2019-06-15 MED ORDER — RELION PEN NEEDLES 31G X 8 MM MISC: 0 refills | Status: DC

## 2019-06-15 MED ORDER — HYDROCODONE-ACETAMINOPHEN 5-325 MG PO TABS: ORAL_TABLET | ORAL | 0 refills | Status: DC

## 2019-06-15 NOTE — Telephone Encounter (Signed)
vicodin eRx'd. 

## 2019-06-15 NOTE — Telephone Encounter (Signed)
RF request for Hydrocodone LOV: 04/05/19 Next ov: 07/06/19 Last written: 04/19/19(60,0) Last [place] 04/05/19 w/ no UDS  Please advise, thanks. Insulin pen needles have already been sent.

## 2019-06-15 NOTE — Telephone Encounter (Signed)
MyChart message read.

## 2019-06-15 NOTE — Telephone Encounter (Signed)
Patient request refill:   HYDROcodone-acetaminophen (NORCO/VICODIN) 5-325 MG tablet [[id number]]   Pharmacy:  [place], [place]  There should be a request that was already submitted for his Insulin Pen Needle (RELION PEN NEEDLE 31G/8MM) 31G X 8 MM MISC [[id number]]  Patient wants to make sure so he can pick up both at the same time  Thank you

## 2019-06-16 DIAGNOSIS — I87333 Chronic venous hypertension (idiopathic) 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: Secondary | ICD-10-CM | POA: Diagnosis not present

## 2019-06-16 DIAGNOSIS — L97529 Non-pressure chronic ulcer of other part of left foot with unspecified severity: Secondary | ICD-10-CM | POA: Diagnosis not present

## 2019-06-16 DIAGNOSIS — I10 Essential (primary) hypertension: Secondary | ICD-10-CM | POA: Diagnosis not present

## 2019-06-16 DIAGNOSIS — S81801A Unspecified open wound, right lower leg, initial encounter: Secondary | ICD-10-CM | POA: Diagnosis not present

## 2019-06-16 DIAGNOSIS — Z794 Long term (current) use of insulin: Secondary | ICD-10-CM | POA: Diagnosis not present

## 2019-06-16 DIAGNOSIS — S91002A Unspecified open wound, left ankle, initial encounter: Secondary | ICD-10-CM | POA: Diagnosis not present

## 2019-06-16 DIAGNOSIS — E119 Type 2 diabetes mellitus without complications: Secondary | ICD-10-CM | POA: Diagnosis not present

## 2019-06-16 DIAGNOSIS — L97212 Non-pressure chronic ulcer of right calf with fat layer exposed: Secondary | ICD-10-CM | POA: Diagnosis not present

## 2019-06-16 DIAGNOSIS — R21 Rash and other nonspecific skin eruption: Secondary | ICD-10-CM | POA: Diagnosis not present

## 2019-06-16 DIAGNOSIS — S91302A Unspecified open wound, left foot, initial encounter: Secondary | ICD-10-CM | POA: Diagnosis not present

## 2019-06-16 DIAGNOSIS — I890 Lymphedema, not elsewhere classified: Secondary | ICD-10-CM | POA: Diagnosis not present

## 2019-06-16 DIAGNOSIS — L97322 Non-pressure chronic ulcer of left ankle with fat layer exposed: Secondary | ICD-10-CM | POA: Diagnosis not present

## 2019-06-21 ENCOUNTER — Other Ambulatory Visit: Payer: Self-pay | Admitting: Family Medicine

## 2019-06-22 NOTE — Progress Notes (Signed)
Pt on monthly penicillin injections.  I agree with this injection 06/04/19. Signed:  Crissie Sickles, MD           06/22/2019

## 2019-06-23 DIAGNOSIS — R21 Rash and other nonspecific skin eruption: Secondary | ICD-10-CM | POA: Diagnosis not present

## 2019-06-23 DIAGNOSIS — L97212 Non-pressure chronic ulcer of right calf with fat layer exposed: Secondary | ICD-10-CM | POA: Diagnosis not present

## 2019-06-23 DIAGNOSIS — E119 Type 2 diabetes mellitus without complications: Secondary | ICD-10-CM | POA: Diagnosis not present

## 2019-06-23 DIAGNOSIS — I10 Essential (primary) hypertension: Secondary | ICD-10-CM | POA: Diagnosis not present

## 2019-06-23 DIAGNOSIS — S91002A Unspecified open wound, left ankle, initial encounter: Secondary | ICD-10-CM | POA: Diagnosis not present

## 2019-06-23 DIAGNOSIS — Z794 Long term (current) use of insulin: Secondary | ICD-10-CM | POA: Diagnosis not present

## 2019-06-23 DIAGNOSIS — I87333 Chronic venous hypertension (idiopathic) 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: Secondary | ICD-10-CM | POA: Diagnosis not present

## 2019-06-23 DIAGNOSIS — L97322 Non-pressure chronic ulcer of left ankle with fat layer exposed: Secondary | ICD-10-CM | POA: Diagnosis not present

## 2019-06-23 DIAGNOSIS — I872 Venous insufficiency (chronic) (peripheral): Secondary | ICD-10-CM | POA: Diagnosis not present

## 2019-06-23 DIAGNOSIS — L97529 Non-pressure chronic ulcer of other part of left foot with unspecified severity: Secondary | ICD-10-CM | POA: Diagnosis not present

## 2019-06-23 DIAGNOSIS — I890 Lymphedema, not elsewhere classified: Secondary | ICD-10-CM | POA: Diagnosis not present

## 2019-06-28 DIAGNOSIS — I87333 Chronic venous hypertension (idiopathic) 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: Secondary | ICD-10-CM | POA: Diagnosis not present

## 2019-06-28 DIAGNOSIS — I10 Essential (primary) hypertension: Secondary | ICD-10-CM | POA: Diagnosis not present

## 2019-06-28 DIAGNOSIS — M79662 Pain in left lower leg: Secondary | ICD-10-CM | POA: Diagnosis not present

## 2019-06-28 DIAGNOSIS — Z794 Long term (current) use of insulin: Secondary | ICD-10-CM | POA: Diagnosis not present

## 2019-06-28 DIAGNOSIS — L97322 Non-pressure chronic ulcer of left ankle with fat layer exposed: Secondary | ICD-10-CM | POA: Diagnosis not present

## 2019-06-28 DIAGNOSIS — I87332 Chronic venous hypertension (idiopathic) with ulcer and inflammation of left lower extremity: Secondary | ICD-10-CM | POA: Diagnosis not present

## 2019-06-28 DIAGNOSIS — R21 Rash and other nonspecific skin eruption: Secondary | ICD-10-CM | POA: Diagnosis not present

## 2019-06-28 DIAGNOSIS — I890 Lymphedema, not elsewhere classified: Secondary | ICD-10-CM | POA: Diagnosis not present

## 2019-06-28 DIAGNOSIS — L03116 Cellulitis of left lower limb: Secondary | ICD-10-CM | POA: Diagnosis not present

## 2019-06-28 DIAGNOSIS — E119 Type 2 diabetes mellitus without complications: Secondary | ICD-10-CM | POA: Diagnosis not present

## 2019-06-28 DIAGNOSIS — L97529 Non-pressure chronic ulcer of other part of left foot with unspecified severity: Secondary | ICD-10-CM | POA: Diagnosis not present

## 2019-06-28 DIAGNOSIS — L97329 Non-pressure chronic ulcer of left ankle with unspecified severity: Secondary | ICD-10-CM | POA: Diagnosis not present

## 2019-06-28 DIAGNOSIS — L97212 Non-pressure chronic ulcer of right calf with fat layer exposed: Secondary | ICD-10-CM | POA: Diagnosis not present

## 2019-06-30 ENCOUNTER — Other Ambulatory Visit: Payer: Self-pay | Admitting: Family Medicine

## 2019-06-30 DIAGNOSIS — I890 Lymphedema, not elsewhere classified: Secondary | ICD-10-CM | POA: Diagnosis not present

## 2019-06-30 DIAGNOSIS — I87333 Chronic venous hypertension (idiopathic) 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: Secondary | ICD-10-CM | POA: Diagnosis not present

## 2019-06-30 DIAGNOSIS — L97229 Non-pressure chronic ulcer of left calf with unspecified severity: Secondary | ICD-10-CM | POA: Diagnosis not present

## 2019-06-30 DIAGNOSIS — I87312 Chronic venous hypertension (idiopathic) with ulcer of left lower extremity: Secondary | ICD-10-CM | POA: Diagnosis not present

## 2019-06-30 DIAGNOSIS — L97212 Non-pressure chronic ulcer of right calf with fat layer exposed: Secondary | ICD-10-CM | POA: Diagnosis not present

## 2019-06-30 DIAGNOSIS — R21 Rash and other nonspecific skin eruption: Secondary | ICD-10-CM | POA: Diagnosis not present

## 2019-06-30 DIAGNOSIS — L97322 Non-pressure chronic ulcer of left ankle with fat layer exposed: Secondary | ICD-10-CM | POA: Diagnosis not present

## 2019-06-30 DIAGNOSIS — E119 Type 2 diabetes mellitus without complications: Secondary | ICD-10-CM | POA: Diagnosis not present

## 2019-06-30 DIAGNOSIS — I10 Essential (primary) hypertension: Secondary | ICD-10-CM | POA: Diagnosis not present

## 2019-06-30 DIAGNOSIS — Z794 Long term (current) use of insulin: Secondary | ICD-10-CM | POA: Diagnosis not present

## 2019-06-30 DIAGNOSIS — L97529 Non-pressure chronic ulcer of other part of left foot with unspecified severity: Secondary | ICD-10-CM | POA: Diagnosis not present

## 2019-06-30 DIAGNOSIS — L97329 Non-pressure chronic ulcer of left ankle with unspecified severity: Secondary | ICD-10-CM | POA: Diagnosis not present

## 2019-07-01 DIAGNOSIS — Z713 Dietary counseling and surveillance: Secondary | ICD-10-CM | POA: Diagnosis not present

## 2019-07-02 DIAGNOSIS — R21 Rash and other nonspecific skin eruption: Secondary | ICD-10-CM | POA: Diagnosis not present

## 2019-07-02 DIAGNOSIS — L97529 Non-pressure chronic ulcer of other part of left foot with unspecified severity: Secondary | ICD-10-CM | POA: Diagnosis not present

## 2019-07-02 DIAGNOSIS — L97322 Non-pressure chronic ulcer of left ankle with fat layer exposed: Secondary | ICD-10-CM | POA: Diagnosis not present

## 2019-07-02 DIAGNOSIS — I10 Essential (primary) hypertension: Secondary | ICD-10-CM | POA: Diagnosis not present

## 2019-07-02 DIAGNOSIS — I87333 Chronic venous hypertension (idiopathic) 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: Secondary | ICD-10-CM | POA: Diagnosis not present

## 2019-07-02 DIAGNOSIS — I890 Lymphedema, not elsewhere classified: Secondary | ICD-10-CM | POA: Diagnosis not present

## 2019-07-02 DIAGNOSIS — L97212 Non-pressure chronic ulcer of right calf with fat layer exposed: Secondary | ICD-10-CM | POA: Diagnosis not present

## 2019-07-02 DIAGNOSIS — Z794 Long term (current) use of insulin: Secondary | ICD-10-CM | POA: Diagnosis not present

## 2019-07-02 DIAGNOSIS — E119 Type 2 diabetes mellitus without complications: Secondary | ICD-10-CM | POA: Diagnosis not present

## 2019-07-05 ENCOUNTER — Ambulatory Visit (INDEPENDENT_AMBULATORY_CARE_PROVIDER_SITE_OTHER): Payer: Medicare Other | Admitting: Internal Medicine

## 2019-07-05 ENCOUNTER — Encounter: Payer: Self-pay | Admitting: Internal Medicine

## 2019-07-05 ENCOUNTER — Other Ambulatory Visit: Payer: Self-pay

## 2019-07-05 ENCOUNTER — Encounter

## 2019-07-05 VITALS — BP 148/60 | HR 65 | Temp 94.1°F | Ht 70.0 in | Wt 375.0 lb

## 2019-07-05 DIAGNOSIS — I5032 Chronic diastolic (congestive) heart failure: Secondary | ICD-10-CM

## 2019-07-05 DIAGNOSIS — I4819 Other persistent atrial fibrillation: Secondary | ICD-10-CM

## 2019-07-05 DIAGNOSIS — I10 Essential (primary) hypertension: Secondary | ICD-10-CM | POA: Diagnosis not present

## 2019-07-05 NOTE — Patient Instructions (Signed)
Medication Instructions:  Your physician recommends that you continue on your current medications as directed. Please refer to the Current Medication list given to you today.  If you need a refill on your cardiac medications before your next appointment, please call your pharmacy.    Follow-Up: At [facility], you and your health needs are our priority.  As part of our continuing mission to provide you with exceptional heart care, we have created designated Provider Care Teams.  These Care Teams include your primary Cardiologist (physician) and Advanced Practice Providers (APPs -  Physician Assistants and Nurse Practitioners) who all work together to provide you with the care you need, when you need it. You will need a follow up appointment in 12 months.  Please call our office 2 months in advance to schedule this appointment.  You may see Dr. Hilty or one of the following Advanced Practice Providers on your designated Care Team: Hao Meng, PA-C . Angela Duke, PA-C    

## 2019-07-05 NOTE — Progress Notes (Signed)
OFFICE NOTE  Chief Complaint:  Leg swelling  Primary Care Physician: [name], MD  HPI:  [name] is a 68 year old male who is currently referred by Dr. Milinda Cave for new onset atrial flutter/fibrillation. [name] was at his office for a routine physical and underwent an EKG. This demonstrated either coarse A. Fib or atrial flutter with variable ventricular response and a rate around 80. It was noted that he was already on metoprolol tartrate 75 mg twice daily for hypertension. 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  He is reportedly unaware of his atrial fibrillation. Fortunately an EKG in the office today shows that he is back in a sinus rhythm or sinus arrhythmia. There is no family history of coronary disease or A. Fib. He denies any chest pain or worsening shortness of breath, but has been having problems with sciatic pain in his back and difficulty ambulating. He reports he gets good sleep at night, rarely wakes up, does not have morning headaches or the need for daytime napping. His screening EPWSS was only 3, suggesting that obstructive sleep apnea is actually fairly unlikely, despite his significant weight.  [name] returns today for follow-up. He underwent a nuclear stress test which was a 2 day study. This demonstrated significant TID 1.48, however it is unclear how reliable this could be because of the fact the study was done on 2 different days. The test was negative for ischemia. EF was calculated at 42%, which is lower than his EF was thought to be at 50%. He denies any chest pain, ever. He also has stable shortness of breath but no worsening symptoms. He reportedly was unaware of A. fib and still has not had any symptoms. He is currently on Xarelto and is not having any bleeding problems.  [name] returns today for follow-up. He reports some  recent worsening of shortness of breath on exertion. He saw Dr. Milinda Cave for this and had some increase in his Lasix. Up to 80 mg daily. He is currently taking 40 mg daily does not notice significant difference in his symptoms. He had a recent echocardiogram which shows an improvement in LV function up to 55-60%. He feels some of this is due to excess weight. He's been less active. He is now close to 400 pounds. Has pain. He has recurrent atrial fibrillation and is in A. fib today to rate is 78. This is been paroxysmal in the past however may be more persistent. He is not aware of his A. fib. He is on Xarelto and is been compliant with that medication. He denies any bleeding problems.   12/24/2016  [name] returns to for follow-up. Initially blood pressure was elevated 166/60 became and 132/64. He has managed to lose about 15 pounds which I commended him on. He denies any chest pain or worsening shortness of breath. He remains in atrial fibrillation with controlled ventricular response of 61. He denies any bleeding problems on Xarelto.  12/15/2017  [name] returns today for annual follow-up.  He has been struggling with lower extremity wound infections.  He is undergoing penicillin injections and wound care management.  He denies any chest pain or worsening shortness of breath however has had about a 12 pound weight gain since I last saw him a year ago.  He says he is going to be working on dietary changes however he is noted  significant swelling of his legs.  There was a suggestion that he may be volume overloaded and he does report some symptoms that sound consistent with orthopnea.  He last had an echo which showed systolic function and 1191.  01/16/2018  [name] was seen today in follow-up.  He reports improvement in his edema.  I increased his Lasix to 80 mg twice daily.  He is lost about 4 pounds since I last saw him.  He denies any worsening shortness of breath.  He continues to struggle with  lower extremity wounds that are slowly healing.  Weight is a significant issue.  An echo was performed which showed normal systolic function and mild RV dysfunction.  Most of his symptoms are likely attributable to that.  11/26/2018  [name] is seen today in follow-up.  Overall he is doing well.  Recently he is lost significant amount of weight.  He was seen by the wound care center who noted that he was put tensive and had a low heart rate.  His diltiazem was decreased from 360 mg to 240 mg daily and his metoprolol was decreased from 75 mg twice daily to 50 mg twice daily.  Today's blood pressure is improved however heart rate remains low at 44.  He seems to be asymptomatic with this.  Denies any worsening shortness of breath or chest pain.  07/05/2019  [name] returns today for follow-up.  He continues to get care in the wound care center.  Unfortunately as he had been losing weight the weight has now come back.  He denies any chest pain or worsening shortness of breath.  His blood pressure appears to be well controlled at home although is a little elevated today.  He has had no significant's symptoms related to A. fib but does remain in persistent A. fib which is rate controlled at 67 today.  PMHx:  Past Medical History:  Diagnosis Date  . ALLERGIC RHINITIS 08/11/2006  . ASTHMA 08/11/2006  . Chronic atrial fibrillation 08/2014  . Chronic combined systolic and diastolic CHF (congestive heart failure) [facility])    Cardiology f/u 12/2017: pt volume overloaded (R heart dysf suspected), BNP very high, lasix increased.  Repeat echo 12/2017: normal LV EF, mild DD, +RV syst dysfxn, mod pulm HTN, biatrial enlgmt.  . Chronic constipation   . Chronic renal insufficiency, stage 2 (mild)    Borderline stage III (GFR 60s).  . DIABETES MELLITUS, TYPE II 08/11/2006  . DM W/RENAL MANIFESTATIONS, TYPE II 04/20/2007  . HYPERTENSION 08/11/2006  . MRSA infection 06/2018   LL venous stasis ulcer infected  .  Normocytic anemia 2016-2019   03/2018 B12 normal, iron ok (ferritin borderline low).  Plan repeat CBC at f/u summer 2019 and if decreased from baseline will check hemoccults and iron labs again.  . OBESITY, MORBID 12/14/2007  . OSA (obstructive sleep apnea)    to get sleep study with [facility]) as of 12/03/2018 consult.  . 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  [id number] ID started him on amoxil prophylaxis---ineffective.  End 2018/Jan 2019 penicillin G injections prophyl helpful but pt declined to continue this as of 01/2018 ID f/u.  ID talked him into resuming monthly penicillin G as of 02/2018 f/u.    Marland Kitchen Restless leg syndrome    Rx'd clonazepam 09/2017 and pt refused to take it after reading the medication's potential side effects.  . Venous stasis ulcers of both lower  extremities (HCC)    Severe lymphedema.  wound clinic care ongoing as of 01/2018    Past Surgical History:  Procedure Laterality Date  . Carotid dopplers  07/23/2018   Left NORMAL.  Right 1-39% ICA stenosis, with <50% distal CCA stenosis (not hemodynamically significant)  . TRANSTHORACIC ECHOCARDIOGRAM  11/2007; 09/2014; 11/2015;12/2017   LV fxn normal, EF normal, mild dilation of left atrium.  2015 grade II diast dysfxn.  2017 EF 55-60%. 2019: LVEF 60-65%, mild RV syst dysf,biatrial enlgmt, mod pulm htn.  Marland Kitchen URETERAL STENT PLACEMENT    . virtual colonoscopy  01/2011   Normal    FAMHx:  Family History  Problem Relation Age of Onset  . Diabetes Mother   . Diabetes Sister   . Hydrocephalus Sister        NPH    SOCHx:   reports that he has never smoked. He has never used smokeless tobacco. He reports that he does not drink alcohol or use drugs.  ALLERGIES:  Allergies  Allergen Reactions  . Other Other (See Comments)    Sneezing, coughing    ROS: Pertinent items noted in HPI and remainder of comprehensive ROS otherwise negative.  HOME  MEDS: Current Outpatient Medications  Medication Sig Dispense Refill  . b complex vitamins capsule Take 1 capsule by mouth every morning.    . butalbital-acetaminophen-caffeine (FIORICET WITH CODEINE) 50-325-40-30 MG capsule TAKE 1 TO 2 CAPSULES BY  MOUTH EVERY 6 HOURS AS  NEEDED FOR HEADACHE(S)  MANUFACTURER RECOMMENDS NOT EXCEEDING 6 CAPSULES/DAY 60 capsule 1  . cetirizine (ZYRTEC) 10 MG tablet Take 10 mg by mouth daily.    . Coenzyme Q10 (CO Q 10) 10 MG CAPS Take by mouth. Reported on 02/19/2016    . diltiazem (TIAZAC) 240 MG 24 hr capsule TAKE 1 CAPSULE BY MOUTH  DAILY 90 capsule 1  . furosemide (LASIX) 40 MG tablet TAKE 2 TABLETS BY MOUTH TWO TIMES DAILY 360 tablet 1  . HUMALOG MIX 75/25 KWIKPEN (75-25) 100 UNIT/ML Kwikpen INJECT SUBCUTANEOUSLY 25  UNITS EVERY MORNING AND 20  UNITS EVERY EVENING AT  SUPPER 45 mL 3  . HYDROcodone-acetaminophen (NORCO/VICODIN) 5-325 MG tablet 1-2 tabs po bid prn pain 60 tablet 0  . Insulin Pen Needle (BD ULTRA-FINE PEN NEEDLES) 29G X 12.7MM MISC 1 each by Other route 2 (two) times daily. 200 each 2  . Insulin Pen Needle (BD ULTRA-FINE PEN NEEDLES) 29G X 12.7MM MISC USE TWICE DAILY 200 each 3  . Insulin Pen Needle (RELION PEN NEEDLE 31G/8MM) 31G X 8 MM MISC USE 1  TWICE DAILY 50 each 0  . lisinopril (ZESTRIL) 40 MG tablet TAKE 1 TABLET BY MOUTH  EVERY DAY 90 tablet 1  . metFORMIN (GLUCOPHAGE) 1000 MG tablet TAKE 1 TABLET BY MOUTH  TWICE A DAY WITH MEALS 180 tablet 1  . metoprolol tartrate (LOPRESSOR) 50 MG tablet Take 25 mg by mouth 2 (two) times daily.    . Multiple Vitamin (MULTIVITAMIN) tablet Take 1 tablet by mouth daily.    [name] ULTRA test strip CHECK BLOOD SUGAR TWO TIMES DAILY 200 strip 3  . saw palmetto 500 MG capsule Take 500 mg by mouth daily.    Marland Kitchen terazosin (HYTRIN) 10 MG capsule TAKE 1 CAPSULE BY MOUTH  ONCE A DAY AT BEDTIME 90 capsule 3  . vitamin E 400 UNIT capsule Take 400 Units by mouth every morning. Selenium 50mg     . XARELTO 20 MG TABS  tablet TAKE 1 TABLET BY MOUTH  ONCE DAILY WITH SUPPER 90 tablet 0   Current Facility-Administered Medications  Medication Dose Route Frequency Provider Last Rate Last Dose  . penicillin g benzathine (BICILLIN LA) 1200000 UNIT/2ML injection 600,000 Units  600,000 Units Intramuscular Q30 days Jeoffrey MassedMcGowen, [name] H, MD   600,000 Units at 04/05/19 0905  . penicillin g benzathine (BICILLIN LA) 1200000 UNIT/2ML injection 600,000 Units  600,000 Units Intramuscular Q30 days Jeoffrey MassedMcGowen, [name] H, MD   600,000 Units at 04/05/19 0905  . penicillin g benzathine (BICILLIN LA) 1200000 UNIT/2ML injection 600,000 Units  600,000 Units Intramuscular Q30 days Jeoffrey MassedMcGowen, [name] H, MD   600,000 Units at 05/05/19 1109  . penicillin g benzathine (BICILLIN LA) 1200000 UNIT/2ML injection 600,000 Units  600,000 Units Intramuscular Q30 days Jeoffrey MassedMcGowen, [name] H, MD   600,000 Units at 05/05/19 1108    LABS/IMAGING: No results found for this or any previous visit (from the past 48 hour(s)). No results found.  VITALS: BP (!) 148/60 (BP Location: Right Wrist, Patient Position: Sitting, Cuff Size: Normal)   Pulse 65   Temp (!) 94.1 F (34.5 C)   Ht 5\' 10"  (1.778 m)   Wt (!) 375 lb (170.1 kg)   BMI 53.81 kg/m   EXAM: General appearance: alert, no distress and morbidly obese Neck: no carotid bruit, no JVD and thyroid not enlarged, symmetric, no tenderness/mass/nodules Lungs: clear to auscultation bilaterally Heart: regular rate and rhythm, S1, S2 normal, no murmur, click, rub or gallop Abdomen: soft, non-tender; bowel sounds normal; no masses,  no organomegaly Extremities: edema 2+ bilateral edema, the legs are wrapped Pulses: 2+ and symmetric Skin: Skin color, texture, turgor normal. No rashes or lesions Neurologic: Alert and oriented X 3, normal strength and tone. Normal symmetric reflexes. Normal coordination and gait Psych: Pleasant  EKG: A. fib at 67, possible RVH-personally reviewed  ASSESSMENT: 1. Longstanding  persistent atrial fibrillation 2. Acute on chronic combined systolic and diastolic heart failure (LVEF 55-60% in 11/2015) 3. PAF-CHADSVASC score of 3 4. Hypertension 5. Morbid obesity 6. 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 Dyslipidemia  PLAN: 1.   [name] has longstanding persistent A. fib and is anticoagulated.  He continues to struggle with lower extremity edema and is cared for in the wound care center.  His blood pressures been well controlled.  He has morbid obesity with recent weight gain.  Additionally has chronic diastolic heart failure on diuretics.  No changes were made to his medications today.  Plan follow-up annually or sooner as necessary.  Chrystie NoseKenneth C. Glenora Morocho, MD, [facility] LLCFACC, FACP  [place]  [facility]  Medical Director of the [facility]  [facility] Diplomate of the American Board of Clinical Lipidology Attending Cardiologist  Direct Dial: [phone]  Fax: [phone]  Website:  [email or web address]  Floye Fesler C Porcia Morganti 07/05/2019, 4:09 PM

## 2019-07-06 ENCOUNTER — Ambulatory Visit (INDEPENDENT_AMBULATORY_CARE_PROVIDER_SITE_OTHER): Payer: Medicare Other | Admitting: Family Medicine

## 2019-07-06 ENCOUNTER — Encounter: Payer: Self-pay | Admitting: Family Medicine

## 2019-07-06 ENCOUNTER — Other Ambulatory Visit (INDEPENDENT_AMBULATORY_CARE_PROVIDER_SITE_OTHER): Payer: Medicare Other

## 2019-07-06 VITALS — BP 150/86 | HR 74 | Temp 97.5°F | Resp 16 | Ht 70.0 in | Wt 375.0 lb

## 2019-07-06 DIAGNOSIS — L03119 Cellulitis of unspecified part of limb: Secondary | ICD-10-CM

## 2019-07-06 DIAGNOSIS — E118 Type 2 diabetes mellitus with unspecified complications: Secondary | ICD-10-CM

## 2019-07-06 DIAGNOSIS — M5442 Lumbago with sciatica, left side: Secondary | ICD-10-CM

## 2019-07-06 DIAGNOSIS — I10 Essential (primary) hypertension: Secondary | ICD-10-CM | POA: Diagnosis not present

## 2019-07-06 DIAGNOSIS — Z7409 Other reduced mobility: Secondary | ICD-10-CM

## 2019-07-06 DIAGNOSIS — E7800 Pure hypercholesterolemia, unspecified: Secondary | ICD-10-CM

## 2019-07-06 DIAGNOSIS — Z23 Encounter for immunization: Secondary | ICD-10-CM | POA: Diagnosis not present

## 2019-07-06 DIAGNOSIS — Z125 Encounter for screening for malignant neoplasm of prostate: Secondary | ICD-10-CM

## 2019-07-06 DIAGNOSIS — R0609 Other forms of dyspnea: Secondary | ICD-10-CM

## 2019-07-06 DIAGNOSIS — G8929 Other chronic pain: Secondary | ICD-10-CM

## 2019-07-06 DIAGNOSIS — R2681 Unsteadiness on feet: Secondary | ICD-10-CM

## 2019-07-06 DIAGNOSIS — I890 Lymphedema, not elsewhere classified: Secondary | ICD-10-CM

## 2019-07-06 DIAGNOSIS — Z0000 Encounter for general adult medical examination without abnormal findings: Secondary | ICD-10-CM

## 2019-07-06 LAB — LIPID PANEL
Cholesterol: 106 mg/dL (ref 0–200)
HDL: 40.2 mg/dL (ref >39.00)
LDL Cholesterol: 55 mg/dL (ref 0–99)
NonHDL: 66.22
Total CHOL/HDL Ratio: 3
Triglycerides: 55 mg/dL (ref 0.0–149.0)
VLDL: 11 mg/dL (ref 0.0–40.0)

## 2019-07-06 LAB — COMPREHENSIVE METABOLIC PANEL
ALT: 10 U/L (ref 0–53)
AST: 17 U/L (ref 0–37)
Albumin: 3.6 g/dL (ref 3.5–5.2)
Alkaline Phosphatase: 76 U/L (ref 39–117)
BUN: 20 mg/dL (ref 6–23)
CO2: 28 mEq/L (ref 19–32)
Calcium: 8.9 mg/dL (ref 8.4–10.5)
Chloride: 101 mEq/L (ref 96–112)
Creatinine, Ser: 1.26 mg/dL (ref 0.40–1.50)
GFR: 56.87 mL/min — ABNORMAL LOW (ref >60.00)
Glucose, Bld: 120 mg/dL — ABNORMAL HIGH (ref 70–99)
Potassium: 4.5 mEq/L (ref 3.5–5.1)
Sodium: 137 mEq/L (ref 135–145)
Total Bilirubin: 1 mg/dL (ref 0.2–1.2)
Total Protein: 6.8 g/dL (ref 6.0–8.3)

## 2019-07-06 LAB — HEMOGLOBIN A1C: Hgb A1c MFr Bld: 7.5 % — ABNORMAL HIGH (ref 4.6–6.5)

## 2019-07-06 LAB — CBC WITH DIFFERENTIAL/PLATELET
Basophils Absolute: 0.1 10*3/uL (ref 0.0–0.1)
Basophils Relative: 1.4 % (ref 0.0–3.0)
Eosinophils Absolute: 0.3 10*3/uL (ref 0.0–0.7)
Eosinophils Relative: 4.5 % (ref 0.0–5.0)
HCT: 40.3 % (ref 39.0–52.0)
Hemoglobin: 13.1 g/dL (ref 13.0–17.0)
Lymphocytes Relative: 20.8 % (ref 12.0–46.0)
Lymphs Abs: 1.3 10*3/uL (ref 0.7–4.0)
MCHC: 32.7 g/dL (ref 30.0–36.0)
MCV: 84.3 fl (ref 78.0–100.0)
Monocytes Absolute: 0.6 10*3/uL (ref 0.1–1.0)
Monocytes Relative: 10.4 % (ref 3.0–12.0)
Neutro Abs: 3.8 10*3/uL (ref 1.4–7.7)
Neutrophils Relative %: 62.9 % (ref 43.0–77.0)
Platelets: 210 10*3/uL (ref 150.0–400.0)
RBC: 4.78 Mil/uL (ref 4.22–5.81)
RDW: 18.2 % — ABNORMAL HIGH (ref 11.5–15.5)
WBC: 6.1 10*3/uL (ref 4.0–10.5)

## 2019-07-06 LAB — PSA, MEDICARE: PSA: 0.71 ng/ml (ref 0.10–4.00)

## 2019-07-06 MED ORDER — ZOSTER VAC RECOMB ADJUVANTED 50 MCG/0.5ML IM SUSR: 0.5000 mL | Freq: Once | INTRAMUSCULAR | 0 refills | Status: AC

## 2019-07-06 MED ORDER — RIVAROXABAN 20 MG PO TABS: ORAL_TABLET | ORAL | 3 refills | Status: DC

## 2019-07-06 MED ORDER — BUTALBITAL-APAP-CAFF-COD 50-325-40-30 MG PO CAPS: ORAL_CAPSULE | ORAL | 0 refills | Status: DC

## 2019-07-06 MED ORDER — TETANUS-DIPHTH-ACELL PERTUSSIS 5-2-15.5 LF-MCG/0.5 IM SUSP: 0.5000 mL | Freq: Once | INTRAMUSCULAR | 0 refills | Status: AC

## 2019-07-06 NOTE — Patient Instructions (Signed)
Health Maintenance, Male Adopting a healthy lifestyle and getting preventive care are important in promoting health and wellness. Ask your health care provider about:  The right schedule for you to have regular tests and exams.  Things you can do on your own to prevent diseases and keep yourself healthy. What should I know about diet, weight, and exercise? Eat a healthy diet   Eat a diet that includes plenty of vegetables, fruits, low-fat dairy products, and lean protein.  Do not eat a lot of foods that are high in solid fats, added sugars, or sodium. Maintain a healthy weight Body mass index (BMI) is a measurement that can be used to identify possible weight problems. It estimates body fat based on height and weight. Your health care provider can help determine your BMI and help you achieve or maintain a healthy weight. Get regular exercise Get regular exercise. This is one of the most important things you can do for your health. Most adults should:  Exercise for at least 150 minutes each week. The exercise should increase your heart rate and make you sweat (moderate-intensity exercise).  Do strengthening exercises at least twice a week. This is in addition to the moderate-intensity exercise.  Spend less time sitting. Even light physical activity can be beneficial. Watch cholesterol and blood lipids Have your blood tested for lipids and cholesterol at 68 years of age, then have this test every 5 years. You may need to have your cholesterol levels checked more often if:  Your lipid or cholesterol levels are high.  You are older than 68 years of age.  You are at high risk for heart disease. What should I know about cancer screening? Many types of cancers can be detected early and may often be prevented. Depending on your health history and family history, you may need to have cancer screening at various ages. This may include screening for:  Colorectal cancer.  Prostate  cancer.  Skin cancer.  Lung cancer. What should I know about heart disease, diabetes, and high blood pressure? Blood pressure and heart disease  High blood pressure causes heart disease and increases the risk of stroke. This is more likely to develop in people who have high blood pressure readings, are of African descent, or are overweight.  Talk with your health care provider about your target blood pressure readings.  Have your blood pressure checked: ? Every 3-5 years if you are 18-39 years of age. ? Every year if you are 40 years old or older.  If you are between the ages of 65 and 75 and are a current or former smoker, ask your health care provider if you should have a one-time screening for abdominal aortic aneurysm (AAA). Diabetes Have regular diabetes screenings. This checks your fasting blood sugar level. Have the screening done:  Once every three years after age 45 if you are at a normal weight and have a low risk for diabetes.  More often and at a younger age if you are overweight or have a high risk for diabetes. What should I know about preventing infection? Hepatitis B If you have a higher risk for hepatitis B, you should be screened for this virus. Talk with your health care provider to find out if you are at risk for hepatitis B infection. Hepatitis C Blood testing is recommended for:  Everyone born from 1945 through 1965.  Anyone with known risk factors for hepatitis C. Sexually transmitted infections (STIs)  You should be screened each year   for STIs, including gonorrhea and chlamydia, if: ? You are sexually active and are younger than 68 years of age. ? You are older than 68 years of age and your health care provider tells you that you are at risk for this type of infection. ? Your sexual activity has changed since you were last screened, and you are at increased risk for chlamydia or gonorrhea. Ask your health care provider if you are at risk.  Ask your  health care provider about whether you are at high risk for HIV. Your health care provider may recommend a prescription medicine to help prevent HIV infection. If you choose to take medicine to prevent HIV, you should first get tested for HIV. You should then be tested every 3 months for as long as you are taking the medicine. Follow these instructions at home: Lifestyle  Do not use any products that contain nicotine or tobacco, such as cigarettes, e-cigarettes, and chewing tobacco. If you need help quitting, ask your health care provider.  Do not use street drugs.  Do not share needles.  Ask your health care provider for help if you need support or information about quitting drugs. Alcohol use  Do not drink alcohol if your health care provider tells you not to drink.  If you drink alcohol: ? Limit how much you have to 0-2 drinks a day. ? Be aware of how much alcohol is in your drink. In the [place] one drink equals one 12 oz bottle of beer (355 mL), one 5 oz glass of wine (148 mL), or one 1 oz glass of hard liquor (44 mL). General instructions  Schedule regular health, dental, and eye exams.  Stay current with your vaccines.  Tell your health care provider if: ? You often feel depressed. ? You have ever been abused or do not feel safe at home. Summary  Adopting a healthy lifestyle and getting preventive care are important in promoting health and wellness.  Follow your health care provider's instructions about healthy diet, exercising, and getting tested or screened for diseases.  Follow your health care provider's instructions on monitoring your cholesterol and blood pressure. This information is not intended to replace advice given to you by your health care provider. Make sure you discuss any questions you have with your health care provider. Document Released: 04/18/2008 Document Revised: 10/14/2018 Document Reviewed: 10/14/2018 Elsevier Patient Education  2020 [employer]  

## 2019-07-06 NOTE — Progress Notes (Signed)
Office Note 07/06/2019  CC:  Chief Complaint  Patient presents with  . Annual Exam    pt is fasting    HPI:  [name] is a 68 y.o. White male who is here for annual health maintenance exam.  He has not taken any of his medications yet today.  DM: FEET-->No tingling, numbness, or burging. Glucose 160 fasting this morning, says it is pretty variable but can't give specifics.  HTN: home monitoring consistently <130/80.  He saw Dr. Debara Pickett yesterday, says "everything checked out well".  I reviewed Dr. Lysbeth Penner note today.  No changes to pt's treatment regimen were made.  Pt just had a case of acute L LL cellulities superimposed on his chronic cellulitis, just finished a course of doxy.  He admits to NO exercise lately.  He is eating a poor/high carb diet lately. Has appt with a nutritionist at [facility] soon.  He sees the MD at the wound clinic and is told regularly that he has good pulses to LLs. R unna boot to come off tomorrow and switch to just compression stocking.  He takes 1 vicodin nightly to help with LBP and L sciatica and this allows him to rest well. Butalbital helping when he gets HA's, takes approx 30 per year or less.  He asks for rx for new rollator today, says his is broken and he can't get anyone to fix it.  Past Medical History:  Diagnosis Date  . ALLERGIC RHINITIS 08/11/2006  . ASTHMA 08/11/2006  . Chronic atrial fibrillation 08/2014  . Chronic combined systolic and diastolic CHF (congestive heart failure) [facility])    Cardiology f/u 12/2017: pt volume overloaded (R heart dysf suspected), BNP very high, lasix increased.  Repeat echo 12/2017: normal LV EF, mild DD, +RV syst dysfxn, mod pulm HTN, biatrial enlgmt.  . Chronic constipation   . Chronic renal insufficiency, stage 2 (mild)    Borderline stage III (GFR 60s).  . DIABETES MELLITUS, TYPE II 08/11/2006  . DM W/RENAL MANIFESTATIONS, TYPE II 04/20/2007  . HYPERTENSION 08/11/2006  . MRSA infection 06/2018   LL venous stasis ulcer infected  . Normocytic anemia 2016-2019   03/2018 B12 normal, iron ok (ferritin borderline low).  Plan repeat CBC at f/u summer 2019 and if decreased from baseline will check hemoccults and iron labs again.  . OBESITY, MORBID 12/14/2007  . OSA (obstructive sleep apnea)    to get sleep study with [facility]) as of 12/03/2018 consult.  . 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  [id number] ID started him on amoxil prophylaxis---ineffective.  End 2018/Jan 2019 penicillin G injections prophyl helpful but pt declined to continue this as of 01/2018 ID f/u.  ID talked him into resuming monthly penicillin G as of 02/2018 f/u.    Marland Kitchen Restless leg syndrome    Rx'd clonazepam 09/2017 and pt refused to take it after reading the medication's potential side effects.  . Venous stasis ulcers of both lower extremities (HCC)    Severe lymphedema.  wound clinic care ongoing as of 01/2018    Past Surgical History:  Procedure Laterality Date  . Carotid dopplers  07/23/2018   Left NORMAL.  Right 1-39% ICA stenosis, with <50% distal CCA stenosis (not hemodynamically significant)  . TRANSTHORACIC ECHOCARDIOGRAM  11/2007; 09/2014; 11/2015;12/2017   LV fxn normal, EF normal, mild dilation of left atrium.  2015 grade II diast dysfxn.  2017 EF 55-60%. 2019: LVEF 60-65%, mild RV  syst dysf,biatrial enlgmt, mod pulm htn.  Marland Kitchen URETERAL STENT PLACEMENT    . virtual colonoscopy  01/2011   Normal    Family History  Problem Relation Age of Onset  . Diabetes Mother   . Diabetes Sister   . Hydrocephalus Sister        NPH    Social History   Socioeconomic History  . Marital status: Single    Spouse name: Not on file  . Number of children: Not on file  . Years of education: Not on file  . Highest education level: Not on file  Occupational History  . Not on file  Social Needs  . Financial resource strain: Not on file  . Food  insecurity    Worry: Not on file    Inability: Not on file  . Transportation needs    Medical: Not on file    Non-medical: Not on file  Tobacco Use  . Smoking status: Never Smoker  . Smokeless tobacco: Never Used  Substance and Sexual Activity  . Alcohol use: No  . Drug use: No  . Sexual activity: Not on file  Lifestyle  . Physical activity    Days per week: Not on file    Minutes per session: Not on file  . Stress: Not on file  Relationships  . Social Herbalist on phone: Not on file    Gets together: Not on file    Attends religious service: Not on file    Active member of club or organization: Not on file    Attends meetings of clubs or organizations: Not on file    Relationship status: Not on file  . Intimate partner violence    Fear of current or ex partner: Not on file    Emotionally abused: Not on file    Physically abused: Not on file    Forced sexual activity: Not on file  Other Topics Concern  . Not on file  Social History Narrative   Single, no children.   Lives in [place] since 1985 [place]   Occupation: Counsellor for [employer].   No Tobacco, no alc, drugs.   Exercise: no    Diet: no       Outpatient Medications Prior to Visit  Medication Sig Dispense Refill  . b complex vitamins capsule Take 1 capsule by mouth every morning.    . cetirizine (ZYRTEC) 10 MG tablet Take 10 mg by mouth daily.    . Coenzyme Q10 (CO Q 10) 10 MG CAPS Take by mouth. Reported on 02/19/2016    . diltiazem (TIAZAC) 240 MG 24 hr capsule TAKE 1 CAPSULE BY MOUTH  DAILY 90 capsule 1  . furosemide (LASIX) 40 MG tablet TAKE 2 TABLETS BY MOUTH TWO TIMES DAILY 360 tablet 1  . HUMALOG MIX 75/25 KWIKPEN (75-25) 100 UNIT/ML Kwikpen INJECT SUBCUTANEOUSLY 25  UNITS EVERY MORNING AND 20  UNITS EVERY EVENING AT  SUPPER 45 mL 3  . HYDROcodone-acetaminophen (NORCO/VICODIN) 5-325 MG tablet 1-2 tabs po bid prn pain 60 tablet 0  . Insulin Pen Needle (BD  ULTRA-FINE PEN NEEDLES) 29G X 12.7MM MISC 1 each by Other route 2 (two) times daily. 200 each 2  . Insulin Pen Needle (BD ULTRA-FINE PEN NEEDLES) 29G X 12.7MM MISC USE TWICE DAILY 200 each 3  . Insulin Pen Needle (RELION PEN NEEDLE 31G/8MM) 31G X 8 MM MISC USE 1  TWICE DAILY 50 each 0  . lisinopril (ZESTRIL) 40 MG  tablet TAKE 1 TABLET BY MOUTH  EVERY DAY 90 tablet 1  . metFORMIN (GLUCOPHAGE) 1000 MG tablet TAKE 1 TABLET BY MOUTH  TWICE A DAY WITH MEALS 180 tablet 1  . metoprolol tartrate (LOPRESSOR) 50 MG tablet Take 25 mg by mouth 2 (two) times daily.    . Multiple Vitamin (MULTIVITAMIN) tablet Take 1 tablet by mouth daily.    Glory Rosebush ULTRA test strip CHECK BLOOD SUGAR TWO TIMES DAILY 200 strip 3  . saw palmetto 500 MG capsule Take 500 mg by mouth daily.    Marland Kitchen terazosin (HYTRIN) 10 MG capsule TAKE 1 CAPSULE BY MOUTH  ONCE A DAY AT BEDTIME 90 capsule 3  . vitamin E 400 UNIT capsule Take 400 Units by mouth every morning. Selenium 22m    . butalbital-acetaminophen-caffeine (FIORICET WITH CODEINE) 50-325-40-30 MG capsule TAKE 1 TO 2 CAPSULES BY  MOUTH EVERY 6 HOURS AS  NEEDED FOR HEADACHE(S)  MANUFACTURER RECOMMENDS NOT EXCEEDING 6 CAPSULES/DAY 60 capsule 1  . XARELTO 20 MG TABS tablet TAKE 1 TABLET BY MOUTH ONCE DAILY WITH SUPPER 90 tablet 0   Facility-Administered Medications Prior to Visit  Medication Dose Route Frequency Provider Last Rate Last Dose  . penicillin g benzathine (BICILLIN LA) 1200000 UNIT/2ML injection 600,000 Units  600,000 Units Intramuscular Q30 days MTammi Sou MD   600,000 Units at 04/05/19 0905  . penicillin g benzathine (BICILLIN LA) 1200000 UNIT/2ML injection 600,000 Units  600,000 Units Intramuscular Q30 days MTammi Sou MD   600,000 Units at 04/05/19 0905  . penicillin g benzathine (BICILLIN LA) 1200000 UNIT/2ML injection 600,000 Units  600,000 Units Intramuscular Q30 days MTammi Sou MD   600,000 Units at 07/06/19 1153  . penicillin g benzathine  (BICILLIN LA) 1200000 UNIT/2ML injection 600,000 Units  600,000 Units Intramuscular Q30 days MTammi Sou MD   600,000 Units at 07/06/19 1152    Allergies  Allergen Reactions  . Other Other (See Comments)    Sneezing, coughing    ROS Review of Systems  Constitutional: Negative for appetite change, chills, fatigue and fever.  HENT: Negative for congestion, dental problem, ear pain and sore throat.   Eyes: Negative for discharge, redness and visual disturbance.  Respiratory: Negative for cough, chest tightness, shortness of breath and wheezing.   Cardiovascular: Negative for chest pain, palpitations and leg swelling.  Gastrointestinal: Negative for abdominal pain, blood in stool, diarrhea, nausea and vomiting.  Genitourinary: Negative for difficulty urinating, dysuria, flank pain, frequency, hematuria and urgency.  Musculoskeletal: Negative for arthralgias, back pain, joint swelling, myalgias and neck stiffness.       L glut pain->"my sciatica"  Skin: Negative for pallor and rash.  Neurological: Negative for dizziness, speech difficulty, weakness and headaches.  Hematological: Negative for adenopathy. Does not bruise/bleed easily.  Psychiatric/Behavioral: Negative for confusion and sleep disturbance. The patient is not nervous/anxious.     PE; Blood pressure (!) 150/86, pulse 74, temperature (!) 97.5 F (36.4 C), temperature source Temporal, resp. rate 16, height _0  (1.778 m), weight (!) 375 lb (170.1 kg), SpO2 92 %. Body mass index is 53.81 kg/m.  Gen: Alert, well appearing, morbidly obese--he had to be examined while he sat in his rollator.  Patient is oriented to person, place, time, and situation. AFFECT: pleasant, lucid thought and speech. ENT: Ears: EACs clear, normal epithelium.  TMs with good light reflex and landmarks bilaterally.  Eyes: no injection, icteris, swelling, or exudate.  EOMI, PERRLA. Nose: no drainage or turbinate edema/swelling.  No injection or focal  lesion.  Mouth: lips without lesion/swelling.  Oral mucosa pink and moist.  Dentition intact and without obvious caries or gingival swelling.  Oropharynx without erythema, exudate, or swelling.  Neck: supple/nontender.  No LAD, mass, or TM.  Carotid pulses 2+ bilaterally, without bruits. CV: Irreg irreg, no m/r/g.   LUNGS: CTA bilat, nonlabored resps, good aeration in all lung fields. ABD: soft, very rotund/obese, NT, ND, BS normal.  No hepatospenomegaly or mass.  No bruits. EXT:  Entire LL's covered with unna boot bilat.  Could not examine feet. Exposed skin of distal thighs (medial aspect) show fibrotic changes, firm papules.   Musculoskeletal: no joint swelling, erythema, warmth, or tenderness.  ROM of all joints intact. Skin - no sores or suspicious lesions or rashes or color changes   Pertinent labs:  Lab Results  Component Value Date   TSH 4.40 06/08/2018   Lab Results  Component Value Date   WBC 6.3 04/05/2019   HGB 12.6 (L) 04/05/2019   HCT 37.7 (L) 04/05/2019   MCV 82.0 04/05/2019   PLT 185.0 04/05/2019   Lab Results  Component Value Date   CREATININE 1.23 04/05/2019   BUN 18 04/05/2019   NA 138 04/05/2019   K 4.4 04/05/2019   CL 100 04/05/2019   CO2 28 04/05/2019   Lab Results  Component Value Date   ALT 9 04/05/2019   AST 13 04/05/2019   ALKPHOS 76 04/05/2019   BILITOT 0.9 04/05/2019   Lab Results  Component Value Date   CHOL 84 03/09/2018   Lab Results  Component Value Date   HDL 34.00 (L) 03/09/2018   Lab Results  Component Value Date   LDLCALC 42 03/09/2018   Lab Results  Component Value Date   TRIG 41.0 03/09/2018   Lab Results  Component Value Date   CHOLHDL 2 03/09/2018   Lab Results  Component Value Date   PSA 0.26 03/09/2018   PSA 0.30 12/09/2016   PSA 0.21 12/11/2015   Lab Results  Component Value Date   HGBA1C 7.2 (H) 04/05/2019    ASSESSMENT AND PLAN:   Health maintenance exam: Reviewed age and gender appropriate health  maintenance issues (prudent diet, regular exercise, health risks of tobacco and excessive alcohol, use of seatbelts, fire alarms in home, use of sunscreen).  Also reviewed age and gender appropriate health screening as well as vaccine recommendations. Vaccines: Tdap rx sent to pharmacy.  Flu vaccine given today.   Shingrix discussed-->rx sent to pharmacy today. Labs: CBC, CMET,FLP, + A1c and PSA. Prostate ca screening: DRE deferred due to pt severe obesity and difficulty in getting into appropriate exam position.  PSA drawn. Colon ca screening: next screening 2022 (virtual colonoscopy?).  Insurance is sending him a stool kit to do screening and he'll give [place] results.  Chronic cellulitis of LLL->due for his penicillin injection today as per ID MD recommendations.  Chronic diastolic HF + chronic a-fib: well controlled on current regimen.  He followed up with Dr. Debara Pickett yesterday and no changes were made.  Immobility: morbid obesity, chronic severe LL edema, and chronic LBP with L sided sciatica are contributing. He requires a rollator b/c he needs to sit frequently to rest when walking (and a standard walker does not have a seat).  His rollator is breaking and I will rx another today.  An After Visit Summary was printed and given to the patient.  FOLLOW UP:  Return in about 3 months (around 10/05/2019) for routine  chronic illness f/u.  Signed:  Crissie Sickles, MD           07/06/2019

## 2019-07-07 ENCOUNTER — Encounter (HOSPITAL_BASED_OUTPATIENT_CLINIC_OR_DEPARTMENT_OTHER): Payer: Medicare Other | Attending: Physician Assistant

## 2019-07-07 ENCOUNTER — Other Ambulatory Visit: Payer: Self-pay

## 2019-07-07 DIAGNOSIS — E1140 Type 2 diabetes mellitus with diabetic neuropathy, unspecified: Secondary | ICD-10-CM | POA: Insufficient documentation

## 2019-07-07 DIAGNOSIS — I890 Lymphedema, not elsewhere classified: Secondary | ICD-10-CM | POA: Diagnosis not present

## 2019-07-07 DIAGNOSIS — I10 Essential (primary) hypertension: Secondary | ICD-10-CM | POA: Diagnosis not present

## 2019-07-07 DIAGNOSIS — Z794 Long term (current) use of insulin: Secondary | ICD-10-CM | POA: Insufficient documentation

## 2019-07-07 DIAGNOSIS — I872 Venous insufficiency (chronic) (peripheral): Secondary | ICD-10-CM | POA: Diagnosis not present

## 2019-07-07 DIAGNOSIS — S81802A Unspecified open wound, left lower leg, initial encounter: Secondary | ICD-10-CM | POA: Diagnosis not present

## 2019-07-14 DIAGNOSIS — L97822 Non-pressure chronic ulcer of other part of left lower leg with fat layer exposed: Secondary | ICD-10-CM | POA: Diagnosis not present

## 2019-07-14 DIAGNOSIS — I10 Essential (primary) hypertension: Secondary | ICD-10-CM | POA: Diagnosis not present

## 2019-07-14 DIAGNOSIS — E1140 Type 2 diabetes mellitus with diabetic neuropathy, unspecified: Secondary | ICD-10-CM | POA: Diagnosis not present

## 2019-07-14 DIAGNOSIS — Z794 Long term (current) use of insulin: Secondary | ICD-10-CM | POA: Diagnosis not present

## 2019-07-14 DIAGNOSIS — I87313 Chronic venous hypertension (idiopathic) with ulcer of bilateral lower extremity: Secondary | ICD-10-CM | POA: Diagnosis not present

## 2019-07-14 DIAGNOSIS — L97229 Non-pressure chronic ulcer of left calf with unspecified severity: Secondary | ICD-10-CM | POA: Diagnosis not present

## 2019-07-14 DIAGNOSIS — E1121 Type 2 diabetes mellitus with diabetic nephropathy: Secondary | ICD-10-CM | POA: Diagnosis not present

## 2019-07-14 DIAGNOSIS — I890 Lymphedema, not elsewhere classified: Secondary | ICD-10-CM | POA: Diagnosis not present

## 2019-07-16 DIAGNOSIS — I890 Lymphedema, not elsewhere classified: Secondary | ICD-10-CM | POA: Diagnosis not present

## 2019-07-16 DIAGNOSIS — R29898 Other symptoms and signs involving the musculoskeletal system: Secondary | ICD-10-CM | POA: Diagnosis not present

## 2019-07-21 DIAGNOSIS — L97222 Non-pressure chronic ulcer of left calf with fat layer exposed: Secondary | ICD-10-CM | POA: Diagnosis not present

## 2019-07-21 DIAGNOSIS — E1140 Type 2 diabetes mellitus with diabetic neuropathy, unspecified: Secondary | ICD-10-CM | POA: Diagnosis not present

## 2019-07-21 DIAGNOSIS — I87313 Chronic venous hypertension (idiopathic) with ulcer of bilateral lower extremity: Secondary | ICD-10-CM | POA: Diagnosis not present

## 2019-07-21 DIAGNOSIS — L97822 Non-pressure chronic ulcer of other part of left lower leg with fat layer exposed: Secondary | ICD-10-CM | POA: Diagnosis not present

## 2019-07-21 DIAGNOSIS — I10 Essential (primary) hypertension: Secondary | ICD-10-CM | POA: Diagnosis not present

## 2019-07-21 DIAGNOSIS — Z794 Long term (current) use of insulin: Secondary | ICD-10-CM | POA: Diagnosis not present

## 2019-07-21 DIAGNOSIS — I890 Lymphedema, not elsewhere classified: Secondary | ICD-10-CM | POA: Diagnosis not present

## 2019-07-23 DIAGNOSIS — I10 Essential (primary) hypertension: Secondary | ICD-10-CM | POA: Diagnosis not present

## 2019-07-23 DIAGNOSIS — Z794 Long term (current) use of insulin: Secondary | ICD-10-CM | POA: Diagnosis not present

## 2019-07-23 DIAGNOSIS — I890 Lymphedema, not elsewhere classified: Secondary | ICD-10-CM | POA: Diagnosis not present

## 2019-07-23 DIAGNOSIS — E1140 Type 2 diabetes mellitus with diabetic neuropathy, unspecified: Secondary | ICD-10-CM | POA: Diagnosis not present

## 2019-07-26 DIAGNOSIS — Z713 Dietary counseling and surveillance: Secondary | ICD-10-CM | POA: Diagnosis not present

## 2019-07-27 ENCOUNTER — Other Ambulatory Visit: Payer: Self-pay | Admitting: Family Medicine

## 2019-07-28 DIAGNOSIS — I890 Lymphedema, not elsewhere classified: Secondary | ICD-10-CM | POA: Diagnosis not present

## 2019-07-28 DIAGNOSIS — I87313 Chronic venous hypertension (idiopathic) with ulcer of bilateral lower extremity: Secondary | ICD-10-CM | POA: Diagnosis not present

## 2019-07-28 DIAGNOSIS — Z794 Long term (current) use of insulin: Secondary | ICD-10-CM | POA: Diagnosis not present

## 2019-07-28 DIAGNOSIS — E1140 Type 2 diabetes mellitus with diabetic neuropathy, unspecified: Secondary | ICD-10-CM | POA: Diagnosis not present

## 2019-07-28 DIAGNOSIS — I10 Essential (primary) hypertension: Secondary | ICD-10-CM | POA: Diagnosis not present

## 2019-07-28 DIAGNOSIS — L97829 Non-pressure chronic ulcer of other part of left lower leg with unspecified severity: Secondary | ICD-10-CM | POA: Diagnosis not present

## 2019-07-28 DIAGNOSIS — L97229 Non-pressure chronic ulcer of left calf with unspecified severity: Secondary | ICD-10-CM | POA: Diagnosis not present

## 2019-08-04 DIAGNOSIS — I10 Essential (primary) hypertension: Secondary | ICD-10-CM | POA: Diagnosis not present

## 2019-08-04 DIAGNOSIS — L97229 Non-pressure chronic ulcer of left calf with unspecified severity: Secondary | ICD-10-CM | POA: Diagnosis not present

## 2019-08-04 DIAGNOSIS — E1140 Type 2 diabetes mellitus with diabetic neuropathy, unspecified: Secondary | ICD-10-CM | POA: Diagnosis not present

## 2019-08-04 DIAGNOSIS — Z794 Long term (current) use of insulin: Secondary | ICD-10-CM | POA: Diagnosis not present

## 2019-08-04 DIAGNOSIS — L97129 Non-pressure chronic ulcer of left thigh with unspecified severity: Secondary | ICD-10-CM | POA: Diagnosis not present

## 2019-08-04 DIAGNOSIS — I890 Lymphedema, not elsewhere classified: Secondary | ICD-10-CM | POA: Diagnosis not present

## 2019-08-04 DIAGNOSIS — L97819 Non-pressure chronic ulcer of other part of right lower leg with unspecified severity: Secondary | ICD-10-CM | POA: Diagnosis not present

## 2019-08-06 DIAGNOSIS — B351 Tinea unguium: Secondary | ICD-10-CM | POA: Diagnosis not present

## 2019-08-06 DIAGNOSIS — E1142 Type 2 diabetes mellitus with diabetic polyneuropathy: Secondary | ICD-10-CM | POA: Diagnosis not present

## 2019-08-06 DIAGNOSIS — Z794 Long term (current) use of insulin: Secondary | ICD-10-CM | POA: Diagnosis not present

## 2019-08-06 DIAGNOSIS — I890 Lymphedema, not elsewhere classified: Secondary | ICD-10-CM | POA: Diagnosis not present

## 2019-08-11 ENCOUNTER — Ambulatory Visit (INDEPENDENT_AMBULATORY_CARE_PROVIDER_SITE_OTHER): Payer: Medicare Other | Admitting: Family Medicine

## 2019-08-11 ENCOUNTER — Other Ambulatory Visit: Payer: Self-pay

## 2019-08-11 ENCOUNTER — Encounter (HOSPITAL_BASED_OUTPATIENT_CLINIC_OR_DEPARTMENT_OTHER): Payer: Medicare Other | Attending: Physician Assistant | Admitting: Physician Assistant

## 2019-08-11 DIAGNOSIS — L97122 Non-pressure chronic ulcer of left thigh with fat layer exposed: Secondary | ICD-10-CM | POA: Insufficient documentation

## 2019-08-11 DIAGNOSIS — L97512 Non-pressure chronic ulcer of other part of right foot with fat layer exposed: Secondary | ICD-10-CM | POA: Insufficient documentation

## 2019-08-11 DIAGNOSIS — L97129 Non-pressure chronic ulcer of left thigh with unspecified severity: Secondary | ICD-10-CM | POA: Insufficient documentation

## 2019-08-11 DIAGNOSIS — I87333 Chronic venous hypertension (idiopathic) 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: Secondary | ICD-10-CM | POA: Insufficient documentation

## 2019-08-11 DIAGNOSIS — L97929 Non-pressure chronic ulcer of unspecified part of left lower leg with unspecified severity: Secondary | ICD-10-CM | POA: Diagnosis not present

## 2019-08-11 DIAGNOSIS — I890 Lymphedema, not elsewhere classified: Secondary | ICD-10-CM | POA: Insufficient documentation

## 2019-08-11 DIAGNOSIS — E1140 Type 2 diabetes mellitus with diabetic neuropathy, unspecified: Secondary | ICD-10-CM | POA: Insufficient documentation

## 2019-08-11 DIAGNOSIS — L97812 Non-pressure chronic ulcer of other part of right lower leg with fat layer exposed: Secondary | ICD-10-CM | POA: Diagnosis not present

## 2019-08-11 DIAGNOSIS — L03119 Cellulitis of unspecified part of limb: Secondary | ICD-10-CM | POA: Diagnosis not present

## 2019-08-11 DIAGNOSIS — L97229 Non-pressure chronic ulcer of left calf with unspecified severity: Secondary | ICD-10-CM | POA: Diagnosis not present

## 2019-08-11 DIAGNOSIS — L97819 Non-pressure chronic ulcer of other part of right lower leg with unspecified severity: Secondary | ICD-10-CM | POA: Diagnosis not present

## 2019-08-11 DIAGNOSIS — E11622 Type 2 diabetes mellitus with other skin ulcer: Secondary | ICD-10-CM | POA: Diagnosis not present

## 2019-08-11 NOTE — Progress Notes (Addendum)
[name] is a 68 y.o. male presents to the office today for monthly Bicillin injections, per physician's orders. Per PCP order, Bicillin 600,000 units/ml (x2) was administered in each upper outer quadrant today. Patient tolerated injection. Patient due for follow up labs/provider appt: Yes. Date due: 10/06/2019, appt made Yes Patient next injection due: 09/11/2019, appt made No  Lattie Haw, CMA  ADDENDUM: Agree with Bicillin injection q month->today's injection is appropriate. Signed:  Crissie Sickles, MD           08/30/2019

## 2019-08-11 NOTE — Progress Notes (Addendum)
[name], Kevin ([id number]) Visit Report for 08/11/2019 Chief Complaint Document Details Patient Name: Date of Service: SLOAN, TAKAGI 08/11/2019 10:30 AM Medical Record [id number] Patient Account Number: [id number] Date of Birth/Sex: Treating RN: 10-Mar-1951 (68 y.o. [name] Primary Care Provider: Nicoletta Ba Other Clinician: Referring Provider: Treating Provider/Extender:[name], [name], [name] in Treatment: 185 Information Obtained from: Patient Chief Complaint patient is here for evaluation venous/lymphedema weeping Electronic Signature(s) Signed: 08/11/2019 11:09:19 AM By: Lenda Kelp PA-C Entered By: Lenda Kelp on 08/11/2019 11:09:19 -------------------------------------------------------------------------------- HPI Details Patient Name: Date of Service: Kevin, [name] 08/11/2019 10:30 AM Medical Record [id number] Patient Account Number: [id number] Date of Birth/Sex: Treating RN: 05-Oct-1951 (68 y.o. [name] Primary Care Provider: Nicoletta Ba Other Clinician: Referring Provider: Treating Provider/Extender:[name], [name], [name] in Treatment: 185 History of Present Illness HPI Description: Referred by PCP for consultation. Patient has long standing history of BLE venous stasis, no prior ulcerations. At beginning of month, developed cellulitis and weeping. Received IM Rocephin followed by Keflex and resolved. Wears compression stocking, appr 6 months old. Not sure strength. No present drainage. 01/22/16 this is a patient who is a type II diabetic on insulin. 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 He tells me he religiously wears pressure stockings of uncertain strength. He was here with weeping edema about 8 months ago but did not have an open wound. Roughly a month ago he had a reopening on his bilateral legs. He is been using bandages and Neosporin. He does not  complain of pain. 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 Last BUN/creatinine I have is from 11/20/15 at 13 and 1.0 respectively 01/29/16; patient arrives today having tolerated the Profore wrap. He brought in his stockings and these are 18 mmHg stockings he bought from [employer]. The compression here is likely inadequate. He does not complain of pain or excessive drainage she has no systemic symptoms. The wound on the right looks improved as does the one on the left although one on the left is more substantial with still tissue at risk below the actual wound area on the bilateral posterior calf 02/05/16; patient arrives with poor edema control. He states that we did put a 4 layer compression on it last week. No weight appear 5 this. 02/12/16; the area on the posterior right Has healed. The left Has a substantial wound that has necrotic surface eschar that requires a debridement with a curette. 02/16/16;the patient called or a Nurse visit secondary to increased swelling. He had been in earlier in the week with his right leg healed. He was transitioned to is on pressure stocking on the right leg with the only open wound on the left, a substantial area on the left posterior calf. Note he has a history of severe lower extremity edema, he has a history of chronic atrial fibrillation but not heart failure per my notes but I'll need to research this. He is not complaining of chest pain shortness of breath or orthopnea. The intake nurse noted blisters on the previously closed right leg 02/19/16; this is the patient's regular visit day. I see him on Friday with escalating edema new wounds on the right leg and clear signs of at least right ventricular heart failure. I increased his Lasix to 40 twice a day. He is returning currently in follow-up. States he  is noticed a decrease in that the edema 02/26/16  patient's legs have much less edema. There is nothing really open on the right leg. The left leg has improved condition of the large superficial wound on the posterior left leg 03/04/16; edema control is very much better. The patient's right leg wounds have healed. On the left leg he continues to have severe venous inflammation on the posterior aspect of the left leg. There is no tenderness and I don't think any of this is cellulitis. 03/11/16; patient's right leg is married healed and he is in his own stocking. The patient's left leg has deteriorated somewhat. There is a lot of erythema around the wound on the posterior left leg. There is also a significant rim of erythema posteriorly just above where the wrap would've ended there is a new wound in this location and a lot of tenderness. Can't rule out cellulitis in this area. 03/15/16; patient's right leg remains healed and he is in his own stocking. The patient's left leg is much better than last review. His major wound on the posterior aspect of his left Is almost fully epithelialized. He has 3 small injuries from the wraps. Really. Erythema seems a lot better on antibiotics 03/18/16; right leg remains healed and he is in his own stocking. The patient's left leg is much better. The area on the posterior aspect of the left calf is fully epithelialized. His 3 small injuries which were wrap injuries on the left are improved only one seems still open his erythema has resolved 03/25/16; patient's right leg remains healed and he is in his own stocking. There is no open area today on the left leg posterior leg is completely closed up. His wrap injuries at the superior aspect of his leg are also resolved. He looks as though he has some irritation on the dorsal ankle but this is fully epithelialized without evidence of infection. 03/28/16; we discharged this patient on Monday. Transitioned him into his own stocking. There were problems almost immediately with  uncontrolled swelling weeping edema multiple some of which have opened. He does not feel systemically unwell in particular no chest pain no shortness of breath and he does not feel 04/08/16; the edema is under better control with the Profore light wrap but he still has pitting edema. There is one large wound anteriorly 2 on the medial aspect of his left leg and 3 small areas on the superior posterior calf. Drainage is not excessive he is tolerating a Profore light well 04/15/16; put a Profore wrap on him last week. This is controlled is edema however he had a lot of pain on his left anterior foot most of his wounds are healed 04/22/16 once again the patient has denuded areas on the left anterior foot which he states are because his wrap slips up word. He saw his primary physician today is on Lasix 40 twice a day and states that he his weight is down 20 pounds over the last 3 months. 04/29/16: Much improved. left anterior foot much improved. He is now on Lasix 80 mg per day. Much improved edema control 05/06/16; I was hoping to be able to discharge him today however once again he has blisters at a low level of where the compression was placed last week mostly on his left lateral but also his left medial leg and a small area on the anterior part of the left foot. 05/09/16; apparently the patient went home after his appointment on 7/4 later in  the evening developing pain in his upper medial thigh together with subjective fever and chills although his temperature was not taken. The pain was so intense he felt he would probably have to call 911. However he then remembered that he had leftover doxycycline from a previous round of antibiotics and took these. By the next morning he felt a lot better. He called and spoke to one of our nurses and I approved doxycycline over the phone thinking that this was in relation to the wounds we had previously seen although they were definitely were not. The patient feels a  lot better old fever no chills he is still working. Blood sugars are reasonably controlled 05/13/16; patient is back in for review of his cellulitis on his anterior medial upper thigh. He is taking doxycycline this is a lot better. Culture I did of the nodular area on the dorsal aspect of his foot grew MRSA this also looks a lot better. 05/20/16; the patient is cellulitis on the medial upper thigh has resolved. All of his wound areas including the left anterior foot, areas on the medial aspect of the left calf and the lateral aspect of the calf at all resolved. He has a new blister on the left dorsal foot at the level of the fourth toe this was excised. No evidence of infection 05/27/16; patient continues to complain weeping edema. He has new blisterlike wounds on the left anterior lateral and posterior lateral calf at the top of his wrap levels. The area on his left anterior foot appears better. He is not complaining of fever, pain or pruritus in his feet. 05/30/16; the patient's blisters on his left anterior leg posterior calf all look improved. He did not increase the Lasix 100 mg as I suggested because he was going to run out of his 40 mg tablets. He is still having weeping edema of his toes 06/03/16; I renewed his Lasix at 80 mg once a day as he was about to run out when I last saw him. He is on 80 mg of Lasix now. I have asked him to cut down on the excessive amount of water he was drinking and asked him to drink according to his thirst mechanisms 06/12/2016 -- was seen 2 days ago and was supposed to wear his compression stockings at home but he is developed lymphedema and superficial blisters on the left lower extremity and hence came in for a review 06/24/16; the remaining wound is on his left anterior leg. He still has edema coming from between his toes. There is lymphedema here however his edema is generally better than when I last saw this. He has a history of atrial fibrillation but does  not have a known history of congestive heart failure nevertheless I think he probably has this at least on a diastolic basis. 07/01/16 I reviewed his echocardiogram from January 2017. This was essentially normal. He did not have LVH, EF of 55-60%. His right ventricular function was normal although he did have trivial tricuspid and pulmonic regurgitation. This is not audible on exam however. I increased his Lasix to do massive edema in his legs well above his knees I think in early July. He was also drinking an excessive amount of water at the time. 07/15/16; missed his appointment last week because of the Labor Day holiday on Monday. He could not get another appointment later in the week. Started to feel the wrap digging in superiorly so we remove the top half and the bottom  half of his wrap. He has extensive erythema and blistering superiorly in the left leg. Very tender. Very swollen. Edema in his foot with leaking edema fluid. He has not been systemically unwell 07/22/16; the area on the left leg laterally required some debridement. The medial wounds look more stable. His wrap injury wounds appear to have healed. Edema and his foot is better, weeping edema is also better. He tells me he is meeting with the supplier of the external compression pumps at work 08/05/16; the patient was on vacation last week in [facility]. His wrap is been on for an extended period of time. Also over the weekend he developed an extensive area of tender erythema across his anterior medial thigh. He took to doxycycline yesterday that he had leftover from a previous prescription. The patient complains of weeping edema coming out of his toes 08/08/16; I saw this patient on 10/2. He was tender across his anterior thigh. I put him on doxycycline. He returns today in follow-up. He does not have any open wounds on his lower leg, he still has edema weeping into his toes. 08/12/16; patient was seen back urgently today to  follow-up for his extensive left thigh cellulitis/erysipelas. He comes back with a lot less swelling and erythema pain is much better. I believe I gave him Augmentin and Cipro. His wrap was cut down as he stated a roll down his legs. He developed blistering above the level of the wrap that remained. He has 2 open blisters and 1 intact. 08/19/16; patient is been doing his primary doctor who is increased his Lasix from 40-80 once a day or 80 already has less edema. Cellulitis has remained improved in the left thigh. 2 open areas on the posterior left calf 08/26/16; he returns today having new open blisters on the anterior part of his left leg. He has his compression pumps but is not yet been shown how to use some vital representative from the supplier. 09/02/16 patient returns today with no open wounds on the left leg. Some maceration in his plantar toes 09/10/2016 -- Dr. Leanord Hawking had recently discharged him on 09/02/2016 and he has come right back with redness swelling and some open ulcers on his left lower extremity. He says this was caused by trying to apply his compression stockings and he's been unable to use this and has not been able to use his lymphedema pumps. He had some doxycycline leftover and he has started on this a few days ago. 09/16/16; there are no open wounds on his leg on the left and no evidence of cellulitis. 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 He does not complain of symptoms here. I am not clear use using his external compression pumps. 09/23/16; I have not seen this patient in 2 weeks. He canceled his appointment 10 days ago as he was going on vacation. He tells me that on Monday he noticed a large area on his posterior left leg which is been draining copiously and is reopened into a large wound. He is been using ABDs and the external part of his juxtalite, according to our nurse this was not on  properly. 10/07/16; Still a substantial area on the posterior left leg. Using silver alginate 10/14/16; in general better although there is still open area which looks healthy. Still using silver alginate. He reminds me that this happen before he left for [facility]. Today while he was showering in the morning.  He had been using his juxtalite's 10/21/16; the area on his posterior left leg is fully epithelialized. However he arrives today with a large area of tender erythema in his medial and posterior left thigh just above the knee. I have marked the area. Once again he is reluctant to consider hospitalization. I treated him with oral antibiotics in the past for a similar situation with resolution I think with doxycycline however this area it seems more extensive to me. He is not complaining of fever but does have chills and says states he is thirsty. His blood sugar today was in the 140s at home 10/25/16 the area on his posterior left leg is fully epithelialized although there is still some weeping edema. The large area of tenderness and erythema in his medial and posterior left thigh is a lot less tender although there is still a lot of swelling in this thigh. He states he feels a lot better. He is on doxycycline and Augmentin that I started last week. This will continued until Tuesday, December 26. I have ordered a duplex ultrasound of the left thigh rule out DVT whether there is an abscess something that would need to be drained I would also like to know. 11/01/16; he still has weeping edema from a not fully epithelialized area on his left posterior calf. Most of the rest of this looks a lot better. He has completed his antibiotics. His thigh is a lot better. Duplex ultrasound did not show a DVT in the thigh 11/08/16; he comes in today with more Denuded surface epithelium from the posterior aspect of his calf. There is no real evidence of cellulitis. The superior aspect of his wrap appears to  have put quite an indentation in his leg just below the knee and this may have contributed. He does not complain of pain or fever. We have been using silver alginate as the primary dressing. The area of cellulitis in the right thigh has totally resolved. He has been using his compression stockings once a week 11/15/16; the patient arrives today with more loss of epithelium from the posterior aspect of his left calf. He now has a fairly substantial wound in this area. The reason behind this deterioration isn't exactly clear although his edema is not well controlled. He states he feels he is generally more swollen systemically. He is not complaining of chest pain shortness of breath fever. Tells me he has an appointment with his primary physician in early February. He is on 80 mg of oral Lasix a day. He claims compliance with the external compression pumps. He is not having any pain in his legs similar to what he has with his recurrent cellulitis 11/22/16; the patient arrives a follow-up of his large area on his left lateral calf. This looks somewhat better today. He came in earlier in the week for a dressing change since I saw him a week ago. He is not complaining of any pain no shortness of breath no chest pain 11/28/16; the patient arrives for follow-up of his large area on the left lateral calf this does not look better. In fact it is larger weeping edema. The surface of the wound does not look too bad. We have been using silver alginate although I'm not certain that this is a dressing issue. 12/05/16; again the patient follows up for a large wound on the left lateral and left posterior calf this does not look better. There continues to be weeping edema necrotic surface tissue. More worrisome than  this once again there is erythema below the wound involving the distal Achilles and heel suggestive of cellulitis. He is on his feet working most of the day of this is not going well. We are changing his  dressing twice a week to facilitate the drainage. 12/12/16; not much change in the overall dimensions of the large area on the left posterior calf. This is very inflamed looking. I gave him an. Doxycycline last week does not really seem to have helped. He found the wrap very painful indeed it seems to of dog into his legs superiorly and perhaps around the heel. He came in early today because the drainage had soaked through his dressings. 12/19/16- patient arrives for follow-up evaluation of his left lower extremity ulcers. He states that he is using his lymphedema pumps once daily when there is "no drainage". He admits to not using his lipedema pumps while under current treatment. His blood sugars have been consistently between 150-200. 12/26/16; the patient is not using his compression pumps at home because of the wetness on his feet. I've advised him that I think it's important for him to use this daily. He finds his feet too wet, he can put a plastic bag over his legs while he is in the pumps. Otherwise I think will be in a vicious circle. We are using silver alginate to the major area on his left posterior calf 01/02/17; the patient's posterior left leg has further of all into 3 open wounds. All of them covered with a necrotic surface. He claims to be using his compression pumps once a day. His edema control is marginal. Continue with silver alginate 01/10/17; the patient's left posterior leg actually looks somewhat better. There is less edema, less erythema. Still has 3 open areas covered with a necrotic surface requiring debridement. He claims to be using his compression pumps once a day his edema control is better 01/17/17; the patient's left posterior calf look better last week when I saw him and his wrap was changed 2 days ago. He has noted increasing pain in the left heel and arrives today with much larger wounds extensive erythema extending down into the entire heel area especially tender  medially. He is not systemically unwell CBGs have been controlled no fever. Our intake nurse showed me limegreen drainage on his AVD pads. 01/24/17; his usual this patient responds nicely to antibiotics last week giving him Levaquin for presumed Pseudomonas. The whole entire posterior part of his leg is much better much less inflamed and in the case of his Achilles heel area much less tender. He has also had some epithelialization posteriorly there are still open areas here and still draining but overall considerably better 01/31/17- He has continue to tolerate the compression wraps. he states that he continues to use the lymphedema pumps daily, and can increase to twice daily on the weekends. He is voicing no complaints or concerns regarding his LLE ulcers 02/07/17-he is here for follow-up evaluation. He states that he noted some erythema to the left medial and anterior thigh, which he states is new as of yesterday. He is concerned about recurrent cellulitis. He states his blood sugars have been slightly elevated, this morning in the 180s 02/14/17; he is here for follow-up evaluation. When he was last here there was erythema superiorly from his posterior wound in his anterior thigh. He was prescribed Levaquin however a culture of the wound surface grew MRSA over the phone I changed him to doxycycline on Monday and things  seem to be a lot better. 02/24/17; patient missed his appointment on Friday therefore we changed his nurse visit into a physician visit today. Still using silver alginate on the large area of the posterior left thigh. He isn't new area on the dorsal left second toe 03/03/17; actually better today although he admits he has not used his external compression pumps in the last 2 days or so because of work responsibilities over the weekend. 03/10/17; continued improvement. External compression pumps once a day almost all of his wounds have closed on the posterior left calf. Better edema  control 03/17/17; in general improved. He still has 3 small open areas on the lateral aspect of his left leg however most of the area on the posterior part of his leg is epithelialized. He has better edema control. He has an ABD pad under his stocking on the right anterior lower leg although he did not let us look at that today. 03/24/17; patient arrives back in clinic today with no open areas however there are areas on the posterior left calf and anterior left calf that are less than 100% epithelialized. His edema is well controlled in the left lower leg. There is some pitting edema probably lymphedema in the left upper thigh. He uses compression pumps at home once per day. I tried to get him to do this twice a day although he is very reticent. 04/01/2017 -- for the last 2 days he's had significant redness, tenderness and weeping and came in for an urgent visit today. 04/07/17; patient still has 6 more days of doxycycline. He was seen by Dr. Con Memos last Wednesday for cellulitis involving the posterior aspect, lateral aspect of his Involving his heel. For the most part he is better there is less erythema and less weeping. He has been on his feet for 12 hours 2 over the weekend. Using his compression pumps once a day 04/14/17 arrives today with continued improvement. Only one area on the posterior left calf that is not fully epithelialized. He has intense bilateral venous inflammation associated with his chronic venous insufficiency disease and secondary lymphedema. We have been using silver alginate to the left posterior calf wound In passing he tells [place] today that the right leg but we have not seen in quite some time has an open area on it but he doesn't want [place] to look at this today states he will show this to [place] next week. 04/21/17; there is no open area on his left leg although he still reports some weeping edema. He showed [place] his right leg today which is the first time we've seen this leg in a  long time. He has a large area of open wound on the right leg anteriorly healthy granulation. Quite a bit of swelling in the right leg and some degree of venous inflammation. He told us about the right leg in passing last week but states that deterioration in the right leg really only happened over the weekend 04/28/17; there is no open area on the left leg although there is an irritated part on the posterior which is like a wrap injury. The wound on the right leg which was new from last week at least to [place] is a lot better. 05/05/17; still no open area on the left leg. Patient is using his new compression stocking which seems to be doing a good job of controlling the edema. He states he is using his compression pumps once per day. The right leg still has an  open wound although it is better in terms of surface area. Required debridement. A lot of pain in the posterior right Achilles marked tenderness. Usually this type of presentation this patient gives concern for an active cellulitis 05/12/17; patient arrives today with his major wound from last week on the right lateral leg somewhat better. Still requiring debridement. He was using his compression stocking on the left leg however that is reopened with superficial wounds anteriorly he did not have an open wound on this leg previously. He is still using his juxta light's once daily at night. He cannot find the time to do this in the morning as he has to be at work by 7 AM 05/19/17; right lateral leg wound looks improved. No debridement required. The concerning area is on the left posterior leg which appears to almost have a subcutaneous hemorrhagic component to it. We've been using silver alginate to all the wounds 05/26/17; the right lateral leg wound continues to look improved. However the area on the left posterior calf is a tightly adherent surface. [place] using silver alginate. Because of the weeping edema in his legs there is very little  good alternatives. 06/02/17; the patient left here last week looking quite good. Major wound on the left posterior calf and a small one on the right lateral calf. Both of these look satisfactory. He tells me that by Wednesday he had noted increased pain in the left leg and drainage. He called on Thursday and Friday to get an appointment here but we were blocked. He did not go to urgent care or his primary physician. He thinks he had a fever on Thursday but did not actually take his temperature. He has not been using his compression pumps on the left leg because of pain. I advised him to go to the emergency room today for IV antibiotics for stents of left leg cellulitis but he has refused I have asked him to take 2 days off work to keep his leg elevated and he has refused this as well. In view of this I'm going to call him and Augmentin and doxycycline. 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 However after a detailed discussion regarding the risk benefits and alternatives he refuses to get admitted to the hospital. With no other recourse I will continue him on oral antibiotics as before and hopefully he'll have his infectious disease consultation this week. 06/16/2017 -- the patient was seen today by the nurse practitioner at infectious disease Ms. Dixon. 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 He has also been advise Lotrimin cream locally. she also had wise IV antibiotics for his condition if it flares up 06/23/17; patient arrives today with drainage bilaterally although the  remaining wound on the left posterior calf after cleaning up today "highlighter yellow drainage" did not look too bad. Unfortunately he has had breakdown on the right anterior leg [previously this leg had not been open and he is using a black stocking] he went to see infectious disease and is been put on clindamycin 150 mg daily, I  did not verify the dose although I'm not familiar with using clindamycin in this dosing range, perhaps for prophylaxisoo 06/27/17; I brought this patient back today to follow-up on the wound deterioration on the right lower leg together with surrounding cellulitis. I started him on doxycycline 4 days ago. This area looks better however he comes in today with intense cellulitis on the medial part of his left thigh. This is not have a wound in this area. Extremely tender. We've been using silver alginate to the wounds on the right lower leg left lower leg with bilateral 4 layer compression he is using his external compression pumps once a day 07/04/17; patient's left medial thigh cellulitis looks better. He has not been using his compression pumps as his insert said it was contraindicated with cellulitis. His right leg continues to make improvements all the wounds are still open. We only have one remaining wound on the left posterior calf. Using silver alginate to all open areas. He is on doxycycline which I started a week ago and should be finishing I gave him Augmentin after Thursday's visit for the severe cellulitis on the left medial thigh which fortunately looks better 07/14/17; the patient's left medial thigh cellulitis has resolved. The cellulitis in his right lower calf on the right also looks better. All of his wounds are stable to improved we've been using silver alginate he has completed the antibiotics I have given him. He has clindamycin 150 mg once a day prescribed by infectious disease for prophylaxis, I've advised him to start this now. We have been using  bilateral Unna boots over silver alginate to the wound areas 07/21/17; the patient is been to see infectious disease who noted his recurrent problems with cellulitis. He was not able to tolerate prophylactic clindamycin therefore he is on amoxicillin 500 twice a day. He also had a second daily dose of Lasix added By Dr. Oneta RackMcKeown but he is not taking this. Nor is he being completely compliant with his compression pumps a especially not this week. He has 2 remaining wounds one on the right posterior lateral lower leg and one on the left posterior medial lower leg. 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 The patient has Unna boots bilaterally. 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 He has bilateral Unna boots for compression and silver alginate to his wounds. Arrives today with his legs looking as good as I have seen him in quite some time. Not surprisingly his wounds look better as well with improvement on the right lateral leg venous insufficiency wound and also the left medial leg. He is still using the compression pumps once a day 08/11/17; both legs appear to be doing better wounds on the right lateral and left medial legs look better. Skin on the right leg quite good. He is been using silver alginate as the primary dressing. I'm going to use Anasept gel calcium alginate and maintain all the secondary dressings 08/18/17; the patient continues to actually do quite well. The area on his right lateral leg is just about closed the left medial also looks better although it is still moist in this area. His edema is well controlled we have been using Anasept gel with calcium alginate and the usual secondary dressings, 4 layer compression and once daily  use of his compression  pumps "always been able to manage 09/01/17; the patient continues to do reasonably well in spite of his trip to [place]. The area on the right lateral leg is epithelialized. Left is much better but still open. He has more edema and more chronic erythema on the left leg [venous inflammation] 09/08/17; he arrives today with no open wound on the right lateral leg and decently controlled edema. Unfortunately his left leg is not nearly as in his good situation as last week.he apparently had increasing edema starting on Saturday. He edema soaked through into his foot so used a plastic bag to walk around his home. The area on the medial right leg which was his open area is about the same however he has lost surface epithelium on the left lateral which is new and he has significant pain in the Achilles area of the left foot. He is already on amoxicillin chronically for prophylaxis of cellulitis in the left leg 09/15/17; he is completed a week of doxycycline and the cellulitis in the left posterior leg and Achilles area is as usual improved. He still has a lot of edema and fluid soaking through his dressings. There is no open wound on the right leg. He saw infectious disease NP today 09/22/17;As usual 1 we transition him from our compression wraps to his stockings things did not go well. He has several small open areas on the right leg. He states this was caused by the compression wrap on his skin although he did not wear this with the stockings over them. He has several superficial areas on the left leg medially laterally posteriorly. He does not have any evidence of active cellulitis especially involving the left Achilles The patient is traveling from [facility] Saturday going to [facility]. He states he isn't attempting to get an appointment with a heel objects wound center there to change his dressings. I am not completely certain whether this will work 10/06/17; the patient came in on Friday for a  nurse visit and the nurse reported that his legs actually look quite good. He arrives in clinic today for his regular follow-up visit. He has a new wound on his left third toe over the PIP probably caused by friction with his footwear. He has small areas on the left leg and a very superficial but epithelialized area on the right anterior lateral lower leg. Other than that his legs look as good as I've seen him in quite some time. We have been using silver alginate Review of systems; no chest pain no shortness of breath other than this a 10 point review of systems negative 10/20/17; seen by Dr. Meyer Russel last week. He had taken some antibiotics [doxycycline] that he had left over. Dr. Meyer Russel thought he had candida infection and declined to give him further antibiotics. He has a small wound remaining on the right lateral leg several areas on the left leg including a larger area on the left posterior several left medial and anterior and a small wound on the left lateral. The area on the left dorsal third toe looks a lot better. ROS; Gen.; no fever, respiratory no cough no sputum Cardiac no chest pain other than this 10 point review of system is negative 10/30/17; patient arrives today having fallen in the bathtub 3 days ago. It took him a while to get up. He has pain and maceration in the wounds on his left leg which have deteriorated. He has not been using his pumps  he also has some maceration on the right lateral leg. 11/03/17; patient continues to have weeping edema especially in the left leg. This saturates his dressings which were just put on on 12/27. As usual the doxycycline seems to take care of the cellulitis on his lower leg. He is not complaining of fever, chills, or other systemic symptoms. He states his leg feels a lot better on the doxycycline I gave him empirically. He also apparently gets injections at his primary doctor's officeo Rocephin for cellulitis prophylaxis. I didn't ask him  about his compression pump compliance today I think that's probably marginal. Arrives in the clinic with all of his dressings primary and secondary macerated full of fluid and he has bilateral edema 11/10/17; the patient's right leg looks some better although there is still a cluster of wounds on the right lateral. The left leg is inflamed with almost circumferential skin loss medially to laterally although we are still maintaining anteriorly. He does not have overt cellulitis there is a lot of drainage. He is not using compression pumps. We have been using silver alginate to the wound areas, there are not a lot of options here 11/17/17; the patient's right leg continues to be stable although there is still open wounds, better than last week. The inflammation in the left leg is better. Still loss of surface layer epithelium especially posteriorly. There is no overt cellulitis in the amount of edema and his left leg is really quite good, tells me he is using his compression pumps once a day. 11/24/17; patient's right leg has a small superficial wound laterally this continues to improve. The inflammation in the left leg is still improving however we have continuous surface layer epithelial loss posteriorly. There is no overt cellulitis in the amount of edema in both legs is really quite good. He states he is using his compression pumps on the left leg once a day for 5 out of 7 days 12/01/17; very small superficial areas on the right lateral leg continue to improve. Edema control in both legs is better today. He has continued loss of surface epithelialization and left posterior calf although I think this is better. We have been using silver alginate with large number of absorptive secondary dressings 4 layer on the left Unna boot on the right at his request. He tells me he is using his compression pumps once a day 12/08/17; he has no open area on the right leg is edema control is good here. On the left leg  however he has marked erythema and tenderness breakdown of skin. He has what appears to be a wrap injury just distal to the popliteal fossa. 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 There was swelling erythema and breakdown in the right leg. He has much worse skin breakdown in the left leg as well multiple open areas medially and posteriorly as well as laterally. He tells me he has been using his compression pumps but tells me he feels that the drainage out of his leg is worse when he uses a compression pumps. To be fair to him he is been saying this for a while however I don't know that I have really been listening to this. I wonder if  the compression pumps are working properly 12/22/17;. Once again he arrives with severe erythema, weeping edema from the left greater than right leg. Noncompliance with compression pumps. New this visit he is complaining of pain on the lateral aspect of the right leg and the medial aspect of his right thigh. He apparently saw his cardiologist Dr. Debara Pickett who was ordered an echocardiogram area and I think this is a step in the right direction 12/25/17; started his doxycycline Monday night. There is still intense erythema of the right leg especially in the anterior thigh although there is less tenderness. The erythema around the wound on the right lateral calf also is less tender. He still complaining of pain in the left heel. His wounds are about the same right lateral left medial left lateral. Superficial but certainly not close to closure. He denies being systemically unwell no fever chills no abdominal pain no diarrhea 12/29/17; back in follow-up of his extensive right calf and right thigh  cellulitis. I added amoxicillin to cover possible doxycycline resistant strep. This seems to of done the trick he is in much less pain there is much less erythema and swelling. He has his echocardiogram at 11:00 this morning. X-ray of the left heel was also negative. 01/05/18; the patient arrived with his edema under much better control. Now that he is retired he is able to use his compression pumps daily and sometimes twice a day per the patient. He has a wound on the right leg the lateral wound looks better. Area on the left leg also looks a lot better. He has no evidence of cellulitis in his bilateral thighs I had a quick peak at his echocardiogram. He is in normal ejection fraction and normal left ventricular function. He has moderate pulmonary hypertension moderately reduced right ventricular function. One would have to wonder about chronic sleep apnea although he says he doesn't snore. He'll review the echocardiogram with his cardiologist. 01/12/18; the patient arrives with the edema in both legs under exemplary control. He is using his compression pumps daily and sometimes twice daily. His wound on the right lateral leg is just about closed. He still has some weeping areas on the posterior left calf and lateral left calf although everything is just about closed here as well. I have spoken with Jeri Lager who is the patient's nurse practitioner and infectious disease. She was concerned that the patient had not understood that the parenteral penicillin injections he was receiving for cellulitis prophylaxis was actually benefiting him. I don't think the patient actually saw that I would tend to agree we were certainly dealing with less infections although he had a serious one last month. 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 He is healed. He'll be placed in juxtalite compression wraps and increase his lymphedema pumps to twice daily. We will follow up  again next week to ensure there are no issues with the new regiment. 01/20/18-he is here for evaluation of bilateral lower extremity weeping edema. Yesterday he was placed in compression wrap to the right lower extremity and compression stocking to left lower shrubbery. He states he uses lymphedema pumps last night and again this morning and noted a blister to the left lower extremity. On exam he was noted to have drainage to the right lower extremity. He will be placed in Unna boots bilaterally and follow-up next week 01/26/18; patient was actually discharged a week ago to his own juxta light stockings only to return the next  day with bilateral lower extremity weeping edema.he was placed in bilateral Unna boots. He arrives today with pain in the back of his left leg. There is no open area on the right leg however there is a linear/wrap injury on the left leg and weeping edema on the left leg posteriorly. I spoke with infectious disease about 10 days ago. They were disappointed that the patient elected to discontinue prophylactic intramuscular penicillin shots as they felt it was particularly beneficial in reducing the frequency of his cellulitis. I discussed this with the patient today. He does not share this view. He'll definitely need antibiotics today. Finally he is traveling to [place] and trauma leaving this Saturday and returning a week later and he does not travel with his pumps. He is going by car 01/30/18; patient was seen 4 days ago and brought back in today for review of cellulitis in the left leg posteriorly. I put him on amoxicillin this really hasn't helped as much as I might like. He is also worried because he is traveling to [facility] trauma by car. Finally we will be rewrapping him. There is no open area on the right leg over his left leg has multiple weeping areas as usual 02/09/18; The same wrap on for 10 days. He did not pick up the last doxycycline I prescribed for him. He  apparently took 4 days worth he already had. There is nothing open on his right leg and the edema control is really quite good. He's had damage in the left leg medially and laterally especially probably related to the prolonged use of Unna boots 02/12/18; the patient arrived in clinic today for a nurse visit/wrap change. He complained of a lot of pain in the left posterior calf. He is taking doxycycline that I previously prescribed for him. Unfortunately even though he used his stockings and apparently used to compression pumps twice a day he has weeping edema coming out of the lateral part of his right leg. This is coming from the lower anterior lateral skin area. 02/16/18; the patient has finished his doxycycline and will finish the amoxicillin 2 days. The area of cellulitis in the left calf posteriorly has resolved. He is no longer having any pain. He tells me he is using his compression pumps at least once a day sometimes twice. 02/23/18; the patient finished his doxycycline and Amoxil last week. On Friday he noticed a small erythematous circle about the size of a quarter on the left lower leg just above his ankle. This rapidly expanded and he now has erythema on the lateral and posterior part of the thigh. This is bright red. Also has an area on the dorsal foot just above his toes and a tender area just below the left popliteal fossa. He came off his prophylactic penicillin injections at his own insistence one or 2 months ago. This is obviously deteriorated since then 03/02/18; patient is on doxycycline and Amoxil. Culture I did last week of the weeping area on the back of his left calf grew group B strep. I have therefore renewed the amoxicillin 500 3 times a day for a further week. He has not been systemically unwell. Still complaining of an area of discomfort right under his left popliteal fossa. There is no open wound on the right leg. He tells me that he is using his pumps twice a day on  most days 03/09/18; patient arrives in clinic today completing his amoxicillin today. The cellulitis on his left leg is better. Furthermore  he tells me that he had intramuscular penicillin shots that his primary care office today. However he also states that the wrap on his right leg fell down shortly after leaving clinic last week. He developed a large blister that was present when he came in for a nurse visit later in the week and then he developed intense discomfort around this area.He tells me he is using his compression pumps 03/16/18; the patient has completed his doxycycline. The infectious part of this/cellulitis in the left heel area left popliteal area is a lot better. He has 2 open areas on the right calf. Still areas on the left calf but this is a lot better as well. 03/24/18; the patient arrives complaining of pain in the left popliteal area again. He thinks some of this is wrap injury. He has no open area on the right leg and really no open area on the left calf either except for the popliteal area. He claims to be compliant with the compression pumps 03/31/18; I gave him doxycycline last week because of cellulitis in the left popliteal area. This is a lot better although the surface epithelium is denuded off and response to this. He arrives today with uncontrolled edema in the right calf area as well as a fingernail injury in the right lateral calf. There is only a few open areas on the left 04/06/18; I gave him amoxicillin doxycycline over the last 2 weeks that the amoxicillin should be completing currently. He is not complaining of any pain or systemic symptoms. The only open areas see has is on the right lateral lower leg paradoxically I cannot see anything on the left lower leg. He tells me he is using his compression pumps twice a day on most days. Silver alginate to the wounds that are open under 4 layer compression 04/13/18; he completed antibiotics and has no new complaints. Using  his compression pumps. Silver alginate that anything that's opened 04/20/18; he is using his compression pumps religiously. Silver alginate 4 layer compression anything that's opened. He comes in today with no open wounds on the left leg but 3 on the right including a new one posteriorly. He has 2 on the right lateral and one on the right posterior. He likes Unna boots on the right leg for reasons that aren't really clear we had the usual 4 layer compression on the left. It may be necessary to move to the 4 layer compression on the right however for now I left them in the Unna boots 04/27/18; he is using his compression pumps at least once a day. He has still the wounds on the right lateral calf. The area right posteriorly has closed. He does not have an open wound on the left under 4 layer compression however on the dorsal left foot just proximal to the toes and the left third toe 2 small open areas were identified 05/11/18; he has not uses compression pumps. The areas on the right lateral calf have coalesced into one large wound necrotic surface. On the left side he has one small wound anteriorly however the edema is now weeping out of a large part of his left leg. He says he wasn't using his pumps because of the weeping fluid. I explained to him that this is the time he needs to pump more 05/18/18; patient states he is using his compression pumps twice a day. The area on the right lateral large wound albeit superficial. On the left side he has innumerable number of  small new wounds on the left calf particularly laterally but several anteriorly and medially. All these appear to have healthy granulated base these look like the remnants of blisters however they occurred under compression. The patient arrives in clinic today with his legs somewhat better. There is certainly less edema, less multiple open areas on the left calf and the right anterior leg looks somewhat better as well superficial and a  little smaller. However he relates pain and erythema over the last 3-4 days in the thigh and I looked at this today. He has not been systemically unwell no fever no chills no change in blood sugar values 05/25/18; comes in today in a better state. The severe cellulitis on his left leg seems better with the Keflex. Not as tender. He has not been systemically unwell Hard to find an open wound on the left lower leg using his compression pumps twice a day The confluent wounds on his right lateral calf somewhat better looking. These will ultimately need debridement I didn't do this today. 06/01/18; the severe cellulitis on the left anterior thigh has resolved and he is completed his Keflex. There is no open wound on the left leg however there is a superficial excoriation at the base of the third toe dorsally. Skin on the bottom of his left foot is macerated looking. The left the wounds on the lateral right leg actually looks some better although he did require debridement of the top half of this wound area with an open curet 06/09/18 on evaluation today patient appears to be doing poorly in regard to his right lower extremity in particular this appears to likely be infected he has very thick purulent discharge along with a bright green tent to the discharge. This makes me concerned about the possibility of pseudomonas. He's also having increased discomfort at this point on evaluation. Fortunately there does not appear to be any evidence of infection spreading to the other location at this time. 06/16/18 on evaluation today patient appears to actually be doing fairly well. His ulcer has actually diminished in size quite significantly at this point which is good news. Nonetheless he still does have some evidence of infection he did see infectious disease this morning before coming here for his appointment. I did review the results of their evaluation and their note today. They did actually have him  discontinue the Cipro and initiate treatment with linezolid at this time. He is doing this for the next seven days and they recommended a follow-up in four months with them. He is the keep a log of the need for intermittent antibiotic therapy between now and when he falls back up with infectious disease. This will help them gaze what exactly they need to do to try and help them out. 06/23/18; the patient arrives today with no open wounds on the left leg and left third toe healed. He is been using his compression pumps twice a day. On the right lateral leg he still has a sizable wound but this is a lot better than last time I saw this. 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 Has been using silver alginate under 4 layer compression 06/30/18; the only open wound he has is on the right lateral leg and this looks healthy. No debridement is required. We have been using silver alginate. He does not have an open wound on the left leg. There  is apparently some drainage from the dorsal proximal third toe on the left although I see no open wound here. 07/03/18 on evaluation today patient was actually here just for a nurse visit rapid change. However when he was here on Wednesday for his rat change due to having been healed on the left and then developing blisters we initiated the wrap again knowing that he would be back today for us to reevaluate and see were at. Unfortunately he has developed some cellulitis into the proximal portion of his right lower extremity even into the region of his thigh. 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 He was placed on one as what at that point. Nonetheless he is done with that and has been tolerating it well otherwise. Doxycycline which in the past really did not seem to be effective for him. Nonetheless I think the best  option may be for us to definitely reinitiate the antibiotics for a longer period of time. 07/07/18; since I last saw this patient a week ago he has had a difficult time. At that point he did not have an open wound on his left leg. We transitioned him into juxta light stockings. He was apparently in the clinic the next day with blisters on the left lateral and left medial lower calf. He also had weeping edema fluid. He was put back into a compression wrap. He was also in the clinic on Friday with intense erythema in his right thigh. Per the patient he was started on Bactrim however that didn't work at all in terms of relieving his pain and swelling. He has taken 3 doxycycline that he had left over from last time and that seems to of helped. He has blistering on the right thigh as well. 07/14/18; the erythema on his right thigh has gotten better with doxycycline that he is finishing. 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 Presumably this cellulitis in the thigh was not related to that although I think this is a bit concerning going forward. He still has an area on the right lateral calf the blister on the right medial calf just below the knee that was discussed above. On the left 2 small open areas left medial and left lateral. Edema control is adequate. He is using his compression pumps twice a day 07/20/18; continued improvement in the condition of both legs especially the edema in his bilateral thighs. He tells me he is been losing weight through a combination of diet and exercise. He is using his compression pumps twice a day. So overall she made to the remaining wounds 07/27/2018; continued improvement in condition of both legs. His edema is well controlled. The area on the right lateral leg is just about closed he had one blisters show up on the medial left upper calf. We have him in 4 layer compression. He is going on  a 10-day trip to [place], [place] and [place]. He will be driving. He wants to wear Unna boots because of the lessening amount of constriction. He will not use compression pumps while he is away 08/05/18 on evaluation today patient actually appears to be doing decently well all things considered in regard to his bilateral lower extremities. The worst ulcer is actually only posterior aspect of his left lower extremity with a four layer compression wrap cut into his leg a couple weeks  back. He did have a trip and actually had [employer] for the trip that he is worn since he was last here. Nonetheless he feels like the [employer] actually do better for him his swelling is up a little bit but he also with his trip was not taking his Lasix on a regular set schedule like he was supposed to be. He states that obviously the reason being that he cannot drive and keep going without having to urinate too frequently which makes it difficult. He did not have his pumps with him while he was away either which I think also maybe playing a role here too. 08/13/2018; the patient only has a small open wound on the right lateral calf which is a big improvement in the last month or 2. He also has the area posteriorly just below the posterior fossa on the left which I think was a wrap injury from several weeks ago. He has no current evidence of cellulitis. He tells me he is back into his compression pumps twice a day. He also tells me that while he was at the [place] somebody stole a section of his extremitease stockings 08/20/2018; back in the clinic with a much improved state. He only has small areas on the right lateral mid calf which is just about healed. This was is more substantial area for quite a prolonged period of time. He has a small open area on the left anterior tibia. The area on the posterior calf just below the popliteal fossa is closed today. He is using his compression pumps twice a  day 08/28/2018; patient has no open wound on the right leg. He has a smattering of open areas on the calf with some weeping lymphedema. More problematically than that it looks as though his wraps of slipped down in his usual he has very angry upper area of edema just below the right medial knee and on the right lateral calf. He has no open area on his feet. The patient is traveling to [facility] next week. I will send him in an antibiotic. We will continue to wrap the right leg. We ordered extremitease stockings for him last week and I plan to transition the right leg to a stocking when he gets home which will be in 10 days time. As usual he is very reluctant to take his pumps with him when he travels 09/07/2018; patient returns from [facility]. He shows me a picture of his left leg in the mid part of his trip last week with intense fire engine erythema. The picture look bad enough I would have considered sending him to the hospital. Instead he went to the wound care center in [facility]. They did not prescribe him antibiotics but he did take some doxycycline he had leftover from a previous visit. I had given him trimethoprim sulfamethoxazole before he left this did not work according to the patient. This is resulted in some improvement fortunately. He comes back with a large wound on the left posterior calf. Smaller area on the left anterior tibia. Denuded blisters on the dorsal left foot over his toes. Does not have much in the way of wounds on the right leg although he does have a very tender area on the right posterior area just below the popliteal fossa also suggestive of infection. He promises me he is back on his pumps twice a day 09/15/2018; the intense cellulitis in his left lower calf is a lot better.  The wound area on the posterior left calf is also so better. However he has reasonably extensive wounds on the dorsal aspect of his second and third toes and the proximal  foot just at the base of the toes. There is nothing open on the right leg 09/22/2018; the patient has excellent edema control in his legs bilaterally. He is using his external compression pumps twice a day. He has no open area on the right leg and only the areas in the left foot dorsally second and third toe area on the left side. He does not have any signs of active cellulitis. 10/06/2018; the patient has good edema control bilaterally. He has no open wound on the right leg. There is a blister in the posterior aspect of his left calf that we had to deal with today. He is using his compression pumps twice a day. There is no signs of active cellulitis. We have been using silver alginate to the wound areas. He still has vulnerable areas on the base of his left first second toes dorsally He has a his extremities stockings and we are going to transition him today into the stocking on the right leg. He is cautioned that he will need to continue to use the compression pumps twice a day. If he notices uncontrolled edema in the right leg he may need to go to 3 times a day. 10/13/2018; the patient came in for a nurse check on Friday he has a large flaccid blister on the right medial calf just below the knee. We unroofed this. He has this and a new area underneath the posterior mid calf which was undoubtedly a blister as well. He also has several small areas on the right which is the area we put his extremities stocking on. 10/19/2018; the patient went to see infectious disease this morning I am not sure if that was a routine follow-up in any case the doxycycline I had given him was discontinued and started on linezolid. He has not started this. It is easy to look at his left calf and the inflammation and think this is cellulitis however he is very tender in the tissue just below the popliteal fossa and I have no doubt that there is infection going on here. He states the problem he is having is that with the  compression pumps the edema goes down and then starts walking the wrap falls down. We will see if we can adhere this. He has 1 or 2 minuscule open areas on the right still areas that are weeping on the posterior left calf, the base of his left second and third toes 10/26/18; back today in clinic with quite of skin breakdown in his left anterior leg. This may have been infection the area below the popliteal fossa seems a lot better however tremendous epithelial loss on the left anterior mid tibia area over quite inexpensive tissue. He has 2 blisters on the right side but no other open wound here. 10/29/2018; came in urgently to see [place] today and we worked him in for review. He states that the 4 layer compression on the right leg caused pain he had to cut it down to roughly his mid calf this caused swelling above the wrap and he has blisters and skin breakdown today. As a result of the pain he has not been using his pumps. Both legs are a lot more edematous and there is a lot of weeping fluid. 11/02/18; arrives in clinic with continued difficulties in  the right leg> left. Leg is swollen and painful. multiple skin blisters and new open areas especially laterally. He has not been using his pumps on the right leg. 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 He is not systemically unwell. 11/09/2018; the patient claims he is being compliant with his pumps. He is finished the doxycycline I gave him last week. Culture I did of the wound on the right lateral leg showed a few very resistant methicillin staph aureus. This was resistant to doxycycline. Nevertheless he states the pain in the leg is a lot better which makes me wonder if the cultured organism was not really what was causing the problem nevertheless this is a very dangerous organism to be culturing out of any wound. His right leg is still a lot larger than the left. He is using an [employer] boot on this area,  he blames a 4-layer compression for causing the original skin breakdown which I doubt is true however I cannot talk him out of it. We have been using silver alginate to all of these areas which were initially blisters 11/16/2018; patient is being compliant with his external compression pumps at twice a day. Miraculously he arrives in clinic today with absolutely no open wounds. He has better edema control on the left where he has been using 4 layer compression versus wound of wounds on the right and I pointed this out to him. There is no inflammation in the skin in his lower legs which is also somewhat unusual for him. There is no open wounds on the dorsal left foot. He has extremitease stockings at home and I have asked him to bring these in next week. 11/25/18 patient's lower extremity on examination today on the left appears for the most part to be wound free. He does have an open wound on the lateral aspect of the right lower extremity but this is minimal compared to what I've seen in past. He does request that we go ahead and wrap the left leg as well even though there's nothing open just so hopefully it will not reopen in short order. 1/28; patient has superficial open wounds on the right lateral calf left anterior calf and left posterior calf. His edema control is adequate. 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 We have been using silver alginate as the primary dressing. He claims compliance with his compression pumps 2/4; patient has superficial open wounds on numerous areas of his left calf and again one on the left dorsal foot. The areas on the right lateral calf have healed. The cellulitis that I gave him doxycycline for last week is also resolved this was mostly on the left anterior calf just below the tibial tuberosity. His edema looks fairly well-controlled. He tells me he went to see his primary  doctor today and had blood work ordered 2/11; once again he has several open areas on the left calf left tibial area. Most of these are small and appear to have healthy granulation. He does not have anything open on the right. The edema and control in his thighs is pretty good which is usually a good indication he has been using his pumps as requested. 2/18; he continues to have several small areas on the left calf and left tibial area. Most of these are small healthy granulation. We put him in his stocking on the right  leg last week and he arrives with a superficial open area over the right upper tibia and a fairly large area on the right lateral tibia in similar condition. His edema control actually does not look too bad, he claims to be using his compression pumps twice a day 2/25. Continued small areas on the left calf and left tibial area. New areas especially on the right are identified just below the tibial tuberosity and on the right upper tibia itself. There are also areas of weeping edema fluid even without an obvious wound. He does not have a considerable degree of lymphedema but clearly there is more edema here than his skin can handle. He states he is using the pumps twice a day. We have an Unna boot on the right and 4 layer compression on the left. 3/3; he continues to have an area on the right lateral calf and right posterior calf just below the popliteal fossa. There is a fair amount of tenderness around the wound on the popliteal fossa but I did not see any evidence of cellulitis, could just be that the wrap came down and rubbed in this area. He does not have an open area on the left leg however there is an area on the left dorsal foot at the base of the third toe We have been using silver alginate to all wound areas 3/10; he did not have an open area on his left leg last time he was here a week ago. Today he arrives with a horizontal wound just below the tibial tuberosity and an  area on the left lateral calf. He has intense erythema and tenderness in this area. The area is on the right lateral calf and right posterior calf better than last week. We have been using silver alginate as usual 3/18 - Patient returns with 3 small open areas on left calf, and 1 small open area on right calf, the skin looks ok with no significant erythema, he continues the UNA boot on right and 4 layer compression on left. The right lateral calf wound is closed , the right posterior is small area. we will continue silver alginate to the areas. 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 He is been tolerating the dressing changes without complication. Fortunately she has made excellent improvement in regard to the overall status of his wounds. Unfortunately every time we cease wrapping him he ends up reopening in causing more significant issues at that point. Again I'm unsure of the best direction to take although I think the lymphedema clinic may be appropriate for him. 02/03/19 on evaluation today patient appears to be doing well in regard to the wounds that we saw him for last week unfortunately he has a new area on the proximal portion of his right medial/posterior lower extremity where the wrap somewhat slowed down and caused swelling and a blister to rub and open. Unfortunately this is the only opening that he has on either leg at this point. 02/17/19 on evaluation today patient's bilateral lower extremities appear to be doing well. He still completely healed in regard to the left lower extremity. In regard to the right lower extremity the area where the wrap and slid down and caused the blister still seems to be slightly open although this is dramatically better than during the  last evaluation two  weeks ago. I'm very pleased with the way this stands overall. 03/03/19 on evaluation today patient appears to be doing well in regard to his right lower extremity in general although he did have a new blister open this does not appear to be showing any evidence of active infection at this time. Fortunately there's No fevers, chills, nausea, or vomiting noted at this time. Overall I feel like he is making good progress it does feel like that the right leg will we perform the [employer] seems to do with a bit better than three layer wrap on the left which slid down on him. We may switch to doing bilateral in the book wraps. 5/4; I have not seen Mr. Metter in quite some time. According to our case manager he did not have an open wound on his left leg last week. He had 1 remaining wound on the right posterior medial calf. He arrives today with multiple openings on the left leg probably were blisters and/or wrap injuries from Unna boots. I do not think the Unna boot's will provide adequate compression on the left. I am also not clear about the frequency he is using the compression pumps. 03/17/19 on evaluation today patient appears to be doing excellent in regard to his lower extremities compared to last week's evaluation apparently. He had gotten significantly worse last week which is unfortunate. The [employer] wrap on the left did not seem to do very well for him at all and in fact it didn't control his swelling significantly enough he had an additional outbreak. Subsequently we go back to the four layer compression wrap on the left. This is good news. At least in that he is doing better and the wound seem to be killing him. He still has not heard anything from the lymphedema clinic. 03/24/19 on evaluation today patient actually appears to be doing much better in regard to his bilateral lower Trinity as compared to last week when I saw him. Fortunately there's no signs of active infection at this time. He  has been tolerating the dressing changes without complication. Overall I'm extremely pleased with the progress and appearance in general. 04/07/19 on evaluation today patient appears to be doing well in regard to his bilateral lower extremities. His swelling is significantly down from where it was previous. With that being said he does have a couple blisters still open at this point but fortunately nothing that seems to be too severe and again the majority of the larger openings has healed at this time. 04/14/19 on evaluation today patient actually appears to be doing quite well in regard to his bilateral lower extremities in fact I'm not even sure there's anything significantly open at this time at any site. Nonetheless he did have some trouble with these wraps where they are somewhat irritating him secondary to the fact that he has noted that the graph wasn't too close down to the end of this foot in a little bit short as well up to his knee. Otherwise things seem to be doing quite well. 04/21/19 upon evaluation today patient's wound bed actually showed evidence of being completely healed in regard to both lower extremities which is excellent news. There does not appear to be any signs of active infection which is also good news. I'm very pleased in this regard. No fevers, chills, nausea, or vomiting noted at this time. 04/28/19 on evaluation today patient appears to be doing a little bit worse in regard  to both lower extremities on the left mainly due to the fact that when he went infection disease the wrap was not wrapped quite high enough he developed a blister above this. On the right he is a small open area of nothing too significant but again this is continuing to give him some trouble he has been were in the Velcro compression that he has at home. 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 He's been tolerating the dressing changes  without complication. Fortunately there's no signs of active infection at this time. No fevers, chills, nausea, or vomiting noted at this time. We have been trying to get an appointment with her lymphedema clinic in [facility] but unfortunately nobody can get them on phone with not been able to even fax information over the patient likewise is not been able to get in touch with them. Overall I'm not sure exactly what's going on here with to reach out again today. 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 Still having a lot of drainage unfortunately. He tells me especially in the left but even on the right. There's no signs of active infection which is good news we've been using so ratcheted up to this point. 05/19/19 on evaluation today patient actually appears to be doing quite well with regard to his left lower extremity which is great news. Fortunately in regard to the right lower extremity has an issues with his wrap and he subsequently did remove this from what I'm understanding. Nonetheless long story short is what he had rewrapped once he removed it subsequently had maggots underneath this wrap whenever he came in for evaluation today. With that being said they were obviously completely cleaned away by the nursing staff. The visit today which is excellent news. However he does appear to potentially have some infection around the right ankle region where the maggots were located as well. He will likely require anabiotic therapy today. 05/26/19 on evaluation today patient actually appears to be doing much better in regard to his bilateral lower extremities. I feel like the infection is under much better control. With that being said there were maggots noted when the wrap was removed yet again today. Again this could have potentially been left over from previous although at this time there does  not appear to be any signs of significant drainage there was obviously on the wrap some drainage as well this contracted gnats or otherwise. Either way I do not see anything that appears to be doing worse in my pinion and in fact I think his drainage has slowed down quite significantly likely mainly due to the fact to his infection being under better control. 06/02/2019 on evaluation today patient actually appears to be doing well with regard to his bilateral lower extremities there is no signs of active infection at this time which is great news. With that being said he does have several open areas more so on the right than the left but nonetheless these are all significantly better than previously noted. 06/09/2019 on evaluation today patient actually appears to be doing well. His wrap stayed up and he did not cause any problems he had more drainage on the right compared to the left but overall I do not see any major issues at this time which is great news. 06/16/2019 on evaluation today patient appears to be  doing excellent with regard to his lower extremities the only area that is open is a new blister that can have opened as of today on the medial ankle on the left. Other than this he really seems to be doing great I see no major issues at this point. 06/23/2019 on evaluation today patient appears to be doing quite well with regard to his bilateral lower extremities. In fact he actually appears to be almost completely healed there is a small area of weeping noted of the right lower extremity just above the ankle. Nonetheless fortunately there is no signs of active infection at this time which is good news. No fevers, chills, nausea, vomiting, or diarrhea. 8/24; the patient arrived for a nurse visit today but complained of very significant pain in the left leg and therefore I was asked to look at this. Noted that he did not have an open area on the left leg last week nevertheless this was wrapped.  The patient states that he is not been able to put his compression pumps on the left leg because of the discomfort. He has not been systemically unwell 06/30/2019 on evaluation today patient unfortunately despite being excellent last week is doing much worse with regard to his left lower extremity today. In fact he had to come in for a nurse on Monday where his left leg had to be rewrapped due to excessive weeping Dr. Leanord Hawking placed him on doxycycline at that point. Fortunately there is no signs of active infection Systemically at this time which is good news. 07/07/2019 in regard to the patient's wounds today he actually seems to be doing well with his right lower extremity there really is nothing open or draining at this point this is great news. Unfortunately the left lower extremity is given him additional trouble at this time. There does not appear to be any signs of active infection nonetheless he does have a lot of edema and swelling noted at this point as well as blistering all of which has led to a much more poor appearing leg at this time compared to where it was 2 weeks ago when it was almost completely healed. Obviously this is a little discouraging for the patient. He is try to contact the lymphedema clinic in [place] he has not been able to get through to them. 07/14/2019 on evaluation today patient actually appears to be doing slightly better with regard to his left lower extremity ulcers. Overall I do feel like at least at the top of the wrap that we have been placing this area has healed quite nicely and looks much better. The remainder of the leg is showing signs of improvement. Unfortunately in the thigh area he still has an open region on the left and again on the right he has been utilizing just a Band-Aid on an area that also opened on the thigh. Again this is an area that were not able to wrap although we did do an Ace wrap to provide some compression that something that  obviously is a little less effective than the compression wraps we have been using on the lower portion of the leg. He does have an appointment with the lymphedema clinic in [facility] on Friday. 07/21/2019 on evaluation today patient appears to be doing better with regard to his lower extremity ulcers. He has been tolerating the dressing changes without complication. Fortunately there is no signs of active infection at this time. No fevers, chills, nausea, vomiting, or diarrhea. I did receive  the paperwork from the physical therapist at the lymphedema clinic in [place]. Subsequently I signed off on that this morning and sent that back to him for further progression with the treatment plan. 07/28/2019 on evaluation today patient appears to be doing very well with regard to his right lower extremity where I do not see any open wounds at this point. Fortunately he is feeling great as far as that is concerned as well. In regard to the left lower extremity he has been having issues with still several areas of weeping and edema although the upper leg is doing better his lower leg still I think is going require the compression wrap at this time. No fevers, chills, nausea, vomiting, or diarrhea. 08/04/2019 on evaluation today patient unfortunately is having new wounds on the right lower extremity. Again we have been using Unna boot wrap on that side. We switched him to using his juxta lite wrap at home. With that being said he tells me he has been using it although his legs extremely swollen and to be honest really does not appear that he has been. I cannot know that for sure however. Nonetheless he has multiple new wounds on the right lower extremity at this time. Obviously we will have to see about getting this rewrapped for him today. 08/11/2019 on evaluation today patient appears to be doing fairly well with regard to his wounds. He has been tolerating the dressing changes including the  compression wraps without complication. He still has a lot of edema in his upper thigh regions bilaterally he is supposed to be seeing the lymphedema clinic on the 15th of this month once his wraps arrive for the upper part of his legs. Electronic Signature(s) Signed: 08/11/2019 12:00:37 PM By: Lenda Kelp PA-C Entered By: Lenda Kelp on 08/11/2019 12:00:37 -------------------------------------------------------------------------------- Physical Exam Details Patient Name: Date of Service: LASEAN, GORNIAK 08/11/2019 10:30 AM Medical Record [id number] Patient Account Number: [id number] Date of Birth/Sex: Treating RN: 09/07/1951 (68 y.o. [name] Primary Care Provider: Nicoletta Ba Other Clinician: Referring Provider: Treating Provider/Extender:[name], [name], [name] in Treatment: 185 Constitutional Well-nourished and well-hydrated in no acute distress. Respiratory normal breathing without difficulty. clear to auscultation bilaterally. Cardiovascular regular rate and rhythm with normal S1, S2. Psychiatric this patient is able to make decisions and demonstrates good insight into disease process. Alert and Oriented x 3. pleasant and cooperative. Notes Patient's wound bed currently at most locations all appear to be doing better he does have a blister on the left side of his knee he still has weeping from the left thigh as well in general we have been using silver alginate on this location. As far as the only other new thing that we see currently he does have on the right second toe an area that is weeping and also causing him trouble today he has had this before may be not in this exact location but nonetheless I do think this is something that we can go ahead and start dressings for her as well. Electronic Signature(s) Signed: 08/11/2019 12:01:14 PM By: Lenda Kelp PA-C Entered By: Lenda Kelp on 08/11/2019  12:01:14 -------------------------------------------------------------------------------- Physician Orders Details Patient Name: Date of Service: TYLYN, DERWIN 08/11/2019 10:30 AM Medical Record [id number] Patient Account Number: [id number] Date of Birth/Sex: Treating RN: 1951-07-30 (68 y.o. 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 Dressing Change Frequency Do not change entire dressing for one week. - both legs Other: - change right toes dressing as needed for drainage Skin Barriers/Peri-Wound Care Moisturizing lotion - to leg TCA Cream or Ointment - both legs Wound Cleansing May shower with protection. Primary Wound Dressing Wound #132 Left,Medial Upper Leg Calcium Alginate with Silver Wound #137 Right,Proximal,Medial Lower Leg Calcium Alginate with Silver Wound #138 Right,Anterior Toe Second Calcium Alginate with Silver Secondary Dressing Wound #132 Left,Medial Upper Leg ABD pad Other: - ACE wrap to secure Wound #137 Right,Proximal,Medial Lower Leg ABD pad Wound #138 Right,Anterior Toe Second Kerlix/Rolled Gauze Dry Gauze Edema Control 4 layer compression: Left lower extremity Unna Boot to Right Lower Extremity Avoid standing for long  periods of time Elevate legs to the level of the heart or above for 30 minutes daily and/or when sitting, a frequency of: Exercise regularly Segmental Compressive Device. - lymphadema pumps 60 min 2 times per day Electronic Signature(s) Signed: 08/12/2019 12:09:12 PM By: Zenaida Deed RN, BSN Signed: 08/15/2019 10:37:03 PM By: Lenda Kelp PA-C Entered By: Zenaida Deed on 08/11/2019 12:00:07 -------------------------------------------------------------------------------- Problem List Details Patient Name: Date of Service: SHIMSHON, NARULA 08/11/2019 10:30 AM Medical Record [id number] Patient Account Number: [id number] Date of Birth/Sex: Treating RN: [birth year] (68 y.o. [name] Primary Care Provider: Nicoletta Ba Other Clinician: Referring Provider: Treating Provider/Extender:[name], [name], [name] in Treatment: 185 Active Problems ICD-10 Evaluated Encounter Code Description Active Date Today Diagnosis L97.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 right calf limited to 06/30/2018 No Yes breakdown of skin L97.221 Non-pressure chronic ulcer of left calf limited to 09/30/2016 No Yes breakdown of skin I87.333 Chronic venous hypertension (idiopathic) with ulcer 01/22/2016 No Yes and inflammation of bilateral lower extremity I89.0 Lymphedema, not elsewhere classified 01/22/2016 No Yes E11.622 Type 2 diabetes mellitus with other skin ulcer 01/22/2016 No Yes 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, 01/22/2016 No Yes unspecified L03.116 Cellulitis of left lower limb 04/01/2017 No Yes Inactive Problems ICD-10 Code Description Active Date Inactive Date 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 06/30/2017 skin L97.521 Non-pressure chronic ulcer of other part of left foot limited to 04/27/2018 04/27/2018 breakdown of skin L03.115 Cellulitis of right lower limb 12/22/2017 12/22/2017 L97.228 Non-pressure  chronic ulcer of left calf with other specified 06/30/2018 06/30/2018 severity L97.511 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 06/30/2018 06/30/2018 breakdown of skin Resolved Problems Electronic Signature(s) Signed: 08/11/2019 11:09:10 AM By: Lenda Kelp PA-C Entered By: Lenda Kelp on 08/11/2019 11:09:07 -------------------------------------------------------------------------------- Progress Note/History and Physical Details Patient Name: Date of Service: TJ, KITCHINGS 08/11/2019 10:30 AM Medical Record [id number] Patient Account Number: [id number] Date of Birth/Sex: Treating RN: 08-Jun-1951 (68 y.o. [name] Primary Care Provider: Nicoletta Ba Other Clinician: Referring Provider: Treating Provider/Extender:[name], [name], [name] in Treatment: 185 Subjective Chief Complaint Information obtained from Patient patient is here for evaluation venous/lymphedema weeping History of Present Illness (HPI) Referred by PCP for consultation. Patient has long standing  history of BLE venous stasis, no prior ulcerations. At beginning of month, developed cellulitis and weeping. Received IM Rocephin followed by Keflex and resolved. Wears compression stocking, appr 6 months old. Not sure strength. No present drainage. 01/22/16 this is a patient who is a type II diabetic on insulin. 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 He tells me he religiously wears pressure stockings of uncertain strength. He was here with weeping edema about 8 months ago but did not have an open wound. Roughly a month ago he had a reopening on his bilateral legs. He is been using bandages and Neosporin. He does not complain of pain. 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 Last BUN/creatinine I have is from 11/20/15 at 13  and 1.0 respectively 01/29/16; patient arrives today having tolerated the Profore wrap. He brought in his stockings and these are 18 mmHg stockings he bought from [employer]. The compression here is likely inadequate. He does not complain of pain or excessive drainage she has no systemic symptoms. The wound on the right looks improved as does the one on the left although one on the left is more substantial with still tissue at risk below the actual wound area on the bilateral posterior calf 02/05/16; patient arrives with poor edema control. He states that we did put a 4 layer compression on it last week. No weight appear 5 this. 02/12/16; the area on the posterior right Has healed. The left Has a substantial wound that has necrotic surface eschar that requires a debridement with a curette. 02/16/16;the patient called or a Nurse visit secondary to increased swelling. He had been in earlier in the week with his right leg healed. He was transitioned to is on pressure stocking on the right leg with the only open wound on the left, a substantial area on the left posterior calf. Note he has a history of severe lower extremity edema, he has a history of chronic atrial fibrillation but not heart failure per my notes but I'll need to research this. He is not complaining of chest pain shortness of breath or orthopnea. The intake nurse noted blisters on the previously closed right leg 02/19/16; this is the patient's regular visit day. I see him on Friday with escalating edema new wounds on the right leg and clear signs of at least right ventricular heart failure. I increased his Lasix to 40 twice a day. He is returning currently in follow-up. States he is noticed a decrease in that the edema 02/26/16 patient's legs have much less edema. There is nothing really open on the right leg. The left leg has improved condition of the large superficial wound on the posterior left leg 03/04/16; edema control is very much better.  The patient's right leg wounds have healed. On the left leg he continues to have severe venous inflammation on the posterior aspect of the left leg. There is no tenderness and I don't think any of this is cellulitis. 03/11/16; patient's right leg is married healed and he is in his own stocking. The patient's left leg has deteriorated somewhat. There is a lot of erythema around the wound on the posterior left leg. There is also a significant rim of erythema posteriorly just above where the wrap would've ended there is a new wound in this location and a lot of tenderness. Can't rule out cellulitis in this area.  03/15/16; patient's right leg remains healed and he is in his own stocking. The patient's left leg is much better than last review. His major wound on the posterior aspect of his left Is almost fully epithelialized. He has 3 small injuries from the wraps. Really. Erythema seems a lot better on antibiotics 03/18/16; right leg remains healed and he is in his own stocking. The patient's left leg is much better. The area on the posterior aspect of the left calf is fully epithelialized. His 3 small injuries which were wrap injuries on the left are improved only one seems still open his erythema has resolved 03/25/16; patient's right leg remains healed and he is in his own stocking. There is no open area today on the left leg posterior leg is completely closed up. His wrap injuries at the superior aspect of his leg are also resolved. He looks as though he has some irritation on the dorsal ankle but this is fully epithelialized without evidence of infection. 03/28/16; we discharged this patient on Monday. Transitioned him into his own stocking. There were problems almost immediately with uncontrolled swelling weeping edema multiple some of which have opened. He does not feel systemically unwell in particular no chest pain no shortness of breath and he does not feel 04/08/16; the edema is under better  control with the Profore light wrap but he still has pitting edema. There is one large wound anteriorly 2 on the medial aspect of his left leg and 3 small areas on the superior posterior calf. Drainage is not excessive he is tolerating a Profore light well 04/15/16; put a Profore wrap on him last week. This is controlled is edema however he had a lot of pain on his left anterior foot most of his wounds are healed 04/22/16 once again the patient has denuded areas on the left anterior foot which he states are because his wrap slips up word. He saw his primary physician today is on Lasix 40 twice a day and states that he his weight is down 20 pounds over the last 3 months. 04/29/16: Much improved. left anterior foot much improved. He is now on Lasix 80 mg per day. Much improved edema control 05/06/16; I was hoping to be able to discharge him today however once again he has blisters at a low level of where the compression was placed last week mostly on his left lateral but also his left medial leg and a small area on the anterior part of the left foot. 05/09/16; apparently the patient went home after his appointment on 7/4 later in the evening developing pain in his upper medial thigh together with subjective fever and chills although his temperature was not taken. The pain was so intense he felt he would probably have to call 911. However he then remembered that he had leftover doxycycline from a previous round of antibiotics and took these. By the next morning he felt a lot better. He called and spoke to one of our nurses and I approved doxycycline over the phone thinking that this was in relation to the wounds we had previously seen although they were definitely were not. The patient feels a lot better old fever no chills he is still working. Blood sugars are reasonably controlled 05/13/16; patient is back in for review of his cellulitis on his anterior medial upper thigh. He is taking doxycycline  this is a lot better. Culture I did of the nodular area on the dorsal aspect of his foot grew MRSA  this also looks a lot better. 05/20/16; the patient is cellulitis on the medial upper thigh has resolved. All of his wound areas including the left anterior foot, areas on the medial aspect of the left calf and the lateral aspect of the calf at all resolved. He has a new blister on the left dorsal foot at the level of the fourth toe this was excised. No evidence of infection 05/27/16; patient continues to complain weeping edema. He has new blisterlike wounds on the left anterior lateral and posterior lateral calf at the top of his wrap levels. The area on his left anterior foot appears better. He is not complaining of fever, pain or pruritus in his feet. 05/30/16; the patient's blisters on his left anterior leg posterior calf all look improved. He did not increase the Lasix 100 mg as I suggested because he was going to run out of his 40 mg tablets. He is still having weeping edema of his toes 06/03/16; I renewed his Lasix at 80 mg once a day as he was about to run out when I last saw him. He is on 80 mg of Lasix now. I have asked him to cut down on the excessive amount of water he was drinking and asked him to drink according to his thirst mechanisms 06/12/2016 -- was seen 2 days ago and was supposed to wear his compression stockings at home but he is developed lymphedema and superficial blisters on the left lower extremity and hence came in for a review 06/24/16; the remaining wound is on his left anterior leg. He still has edema coming from between his toes. There is lymphedema here however his edema is generally better than when I last saw this. He has a history of atrial fibrillation but does not have a known history of congestive heart failure nevertheless I think he probably has this at least on a diastolic basis. 07/01/16 I reviewed his echocardiogram from January 2017. This was essentially  normal. He did not have LVH, EF of 55-60%. His right ventricular function was normal although he did have trivial tricuspid and pulmonic regurgitation. This is not audible on exam however. I increased his Lasix to do massive edema in his legs well above his knees I think in early July. He was also drinking an excessive amount of water at the time. 07/15/16; missed his appointment last week because of the Labor Day holiday on Monday. He could not get another appointment later in the week. Started to feel the wrap digging in superiorly so we remove the top half and the bottom half of his wrap. He has extensive erythema and blistering superiorly in the left leg. Very tender. Very swollen. Edema in his foot with leaking edema fluid. He has not been systemically unwell 07/22/16; the area on the left leg laterally required some debridement. The medial wounds look more stable. His wrap injury wounds appear to have healed. Edema and his foot is better, weeping edema is also better. He tells me he is meeting with the supplier of the external compression pumps at work 08/05/16; the patient was on vacation last week in [facility]. His wrap is been on for an extended period of time. Also over the weekend he developed an extensive area of tender erythema across his anterior medial thigh. He took to doxycycline yesterday that he had leftover from a previous prescription. The patient complains of weeping edema coming out of his toes 08/08/16; I saw this patient on 10/2. He was tender  across his anterior thigh. I put him on doxycycline. He returns today in follow-up. He does not have any open wounds on his lower leg, he still has edema weeping into his toes. 08/12/16; patient was seen back urgently today to follow-up for his extensive left thigh cellulitis/erysipelas. He comes back with a lot less swelling and erythema pain is much better. I believe I gave him Augmentin and Cipro. His wrap was cut down as he stated  a roll down his legs. He developed blistering above the level of the wrap that remained. He has 2 open blisters and 1 intact. 08/19/16; patient is been doing his primary doctor who is increased his Lasix from 40-80 once a day or 80 already has less edema. Cellulitis has remained improved in the left thigh. 2 open areas on the posterior left calf 08/26/16; he returns today having new open blisters on the anterior part of his left leg. He has his compression pumps but is not yet been shown how to use some vital representative from the supplier. 09/02/16 patient returns today with no open wounds on the left leg. Some maceration in his plantar toes 09/10/2016 -- Dr. Leanord Hawking had recently discharged him on 09/02/2016 and he has come right back with redness swelling and some open ulcers on his left lower extremity. He says this was caused by trying to apply his compression stockings and he's been unable to use this and has not been able to use his lymphedema pumps. He had some doxycycline leftover and he has started on this a few days ago. 09/16/16; there are no open wounds on his leg on the left and no evidence of cellulitis. 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 He does not complain of symptoms here. I am not clear use using his external compression pumps. 09/23/16; I have not seen this patient in 2 weeks. He canceled his appointment 10 days ago as he was going on vacation. He tells me that on Monday he noticed a large area on his posterior left leg which is been draining copiously and is reopened into a large wound. He is been using ABDs and the external part of his juxtalite, according to our nurse this was not on properly. 10/07/16; Still a substantial area on the posterior left leg. Using silver alginate 10/14/16; in general better although there is still open area which looks healthy. Still using silver alginate. He reminds me that this  happen before he left for [facility]. Today while he was showering in the morning. He had been using his juxtalite's 10/21/16; the area on his posterior left leg is fully epithelialized. However he arrives today with a large area of tender erythema in his medial and posterior left thigh just above the knee. I have marked the area. Once again he is reluctant to consider hospitalization. I treated him with oral antibiotics in the past for a similar situation with resolution I think with doxycycline however this area it seems more extensive to me. He is not complaining of fever but does have chills and says states he is thirsty. His blood sugar today was in the 140s at home 10/25/16 the area on his posterior left leg is fully epithelialized although there is still some weeping edema. The large area of tenderness and erythema in his medial and posterior left thigh is a lot less tender although there is still a lot of swelling in this thigh. He states he feels  a lot better. He is on doxycycline and Augmentin that I started last week. This will continued until Tuesday, December 26. I have ordered a duplex ultrasound of the left thigh rule out DVT whether there is an abscess something that would need to be drained I would also like to know. 11/01/16; he still has weeping edema from a not fully epithelialized area on his left posterior calf. Most of the rest of this looks a lot better. He has completed his antibiotics. His thigh is a lot better. Duplex ultrasound did not show a DVT in the thigh 11/08/16; he comes in today with more Denuded surface epithelium from the posterior aspect of his calf. There is no real evidence of cellulitis. The superior aspect of his wrap appears to have put quite an indentation in his leg just below the knee and this may have contributed. He does not complain of pain or fever. We have been using silver alginate as the primary dressing. The area of cellulitis in the right  thigh has totally resolved. He has been using his compression stockings once a week 11/15/16; the patient arrives today with more loss of epithelium from the posterior aspect of his left calf. He now has a fairly substantial wound in this area. The reason behind this deterioration isn't exactly clear although his edema is not well controlled. He states he feels he is generally more swollen systemically. He is not complaining of chest pain shortness of breath fever. Tells me he has an appointment with his primary physician in early February. He is on 80 mg of oral Lasix a day. He claims compliance with the external compression pumps. He is not having any pain in his legs similar to what he has with his recurrent cellulitis 11/22/16; the patient arrives a follow-up of his large area on his left lateral calf. This looks somewhat better today. He came in earlier in the week for a dressing change since I saw him a week ago. He is not complaining of any pain no shortness of breath no chest pain 11/28/16; the patient arrives for follow-up of his large area on the left lateral calf this does not look better. In fact it is larger weeping edema. The surface of the wound does not look too bad. We have been using silver alginate although I'm not certain that this is a dressing issue. 12/05/16; again the patient follows up for a large wound on the left lateral and left posterior calf this does not look better. There continues to be weeping edema necrotic surface tissue. More worrisome than this once again there is erythema below the wound involving the distal Achilles and heel suggestive of cellulitis. He is on his feet working most of the day of this is not going well. We are changing his dressing twice a week to facilitate the drainage. 12/12/16; not much change in the overall dimensions of the large area on the left posterior calf. This is very inflamed looking. I gave him an. Doxycycline last week does not really  seem to have helped. He found the wrap very painful indeed it seems to of dog into his legs superiorly and perhaps around the heel. He came in early today because the drainage had soaked through his dressings. 12/19/16- patient arrives for follow-up evaluation of his left lower extremity ulcers. He states that he is using his lymphedema pumps once daily when there is "no drainage". He admits to not using his lipedema pumps while under current treatment. His  blood sugars have been consistently between 150-200. 12/26/16; the patient is not using his compression pumps at home because of the wetness on his feet. I've advised him that I think it's important for him to use this daily. He finds his feet too wet, he can put a plastic bag over his legs while he is in the pumps. Otherwise I think will be in a vicious circle. We are using silver alginate to the major area on his left posterior calf 01/02/17; the patient's posterior left leg has further of all into 3 open wounds. All of them covered with a necrotic surface. He claims to be using his compression pumps once a day. His edema control is marginal. Continue with silver alginate 01/10/17; the patient's left posterior leg actually looks somewhat better. There is less edema, less erythema. Still has 3 open areas covered with a necrotic surface requiring debridement. He claims to be using his compression pumps once a day his edema control is better 01/17/17; the patient's left posterior calf look better last week when I saw him and his wrap was changed 2 days ago. He has noted increasing pain in the left heel and arrives today with much larger wounds extensive erythema extending down into the entire heel area especially tender medially. He is not systemically unwell CBGs have been controlled no fever. Our intake nurse showed me limegreen drainage on his AVD pads. 01/24/17; his usual this patient responds nicely to antibiotics last week giving him Levaquin for  presumed Pseudomonas. The whole entire posterior part of his leg is much better much less inflamed and in the case of his Achilles heel area much less tender. He has also had some epithelialization posteriorly there are still open areas here and still draining but overall considerably better 01/31/17- He has continue to tolerate the compression wraps. he states that he continues to use the lymphedema pumps daily, and can increase to twice daily on the weekends. He is voicing no complaints or concerns regarding his LLE ulcers 02/07/17-he is here for follow-up evaluation. He states that he noted some erythema to the left medial and anterior thigh, which he states is new as of yesterday. He is concerned about recurrent cellulitis. He states his blood sugars have been slightly elevated, this morning in the 180s 02/14/17; he is here for follow-up evaluation. When he was last here there was erythema superiorly from his posterior wound in his anterior thigh. He was prescribed Levaquin however a culture of the wound surface grew MRSA over the phone I changed him to doxycycline on Monday and things seem to be a lot better. 02/24/17; patient missed his appointment on Friday therefore we changed his nurse visit into a physician visit today. Still using silver alginate on the large area of the posterior left thigh. He isn't new area on the dorsal left second toe 03/03/17; actually better today although he admits he has not used his external compression pumps in the last 2 days or so because of work responsibilities over the weekend. 03/10/17; continued improvement. External compression pumps once a day almost all of his wounds have closed on the posterior left calf. Better edema control 03/17/17; in general improved. He still has 3 small open areas on the lateral aspect of his left leg however most of the area on the posterior part of his leg is epithelialized. He has better edema control. He has an ABD pad under  his stocking on the right anterior lower leg although he did not let  us look at that today. 03/24/17; patient arrives back in clinic today with no open areas however there are areas on the posterior left calf and anterior left calf that are less than 100% epithelialized. His edema is well controlled in the left lower leg. There is some pitting edema probably lymphedema in the left upper thigh. He uses compression pumps at home once per day. I tried to get him to do this twice a day although he is very reticent. 04/01/2017 -- for the last 2 days he's had significant redness, tenderness and weeping and came in for an urgent visit today. 04/07/17; patient still has 6 more days of doxycycline. He was seen by Dr. Meyer Russel last Wednesday for cellulitis involving the posterior aspect, lateral aspect of his Involving his heel. For the most part he is better there is less erythema and less weeping. He has been on his feet for 12 hours o2 over the weekend. Using his compression pumps once a day 04/14/17 arrives today with continued improvement. Only one area on the posterior left calf that is not fully epithelialized. He has intense bilateral venous inflammation associated with his chronic venous insufficiency disease and secondary lymphedema. We have been using silver alginate to the left posterior calf wound In passing he tells [place] today that the right leg but we have not seen in quite some time has an open area on it but he doesn't want [place] to look at this today states he will show this to [place] next week. 04/21/17; there is no open area on his left leg although he still reports some weeping edema. He showed [place] his right leg today which is the first time we've seen this leg in a long time. He has a large area of open wound on the right leg anteriorly healthy granulation. Quite a bit of swelling in the right leg and some degree of venous inflammation. He told us about the right leg in passing last week but states  that deterioration in the right leg really only happened over the weekend 04/28/17; there is no open area on the left leg although there is an irritated part on the posterior which is like a wrap injury. The wound on the right leg which was new from last week at least to [place] is a lot better. 05/05/17; still no open area on the left leg. Patient is using his new compression stocking which seems to be doing a good job of controlling the edema. He states he is using his compression pumps once per day. The right leg still has an open wound although it is better in terms of surface area. Required debridement. A lot of pain in the posterior right Achilles marked tenderness. Usually this type of presentation this patient gives concern for an active cellulitis 05/12/17; patient arrives today with his major wound from last week on the right lateral leg somewhat better. Still requiring debridement. He was using his compression stocking on the left leg however that is reopened with superficial wounds anteriorly he did not have an open wound on this leg previously. He is still using his juxta light's once daily at night. He cannot find the time to do this in the morning as he has to be at work by 7 AM 05/19/17; right lateral leg wound looks improved. No debridement required. The concerning area is on the left posterior leg which appears to almost have a subcutaneous hemorrhagic component to it. We've been using silver alginate to all the wounds  05/26/17; the right lateral leg wound continues to look improved. However the area on the left posterior calf is a tightly adherent surface. [place] using silver alginate. Because of the weeping edema in his legs there is very little good alternatives. 06/02/17; the patient left here last week looking quite good. Major wound on the left posterior calf and a small one on the right lateral calf. Both of these look satisfactory. He tells me that by Wednesday he had noted  increased pain in the left leg and drainage. He called on Thursday and Friday to get an appointment here but we were blocked. He did not go to urgent care or his primary physician. He thinks he had a fever on Thursday but did not actually take his temperature. He has not been using his compression pumps on the left leg because of pain. I advised him to go to the emergency room today for IV antibiotics for stents of left leg cellulitis but he has refused I have asked him to take 2 days off work to keep his leg elevated and he has refused this as well. In view of this I'm going to call him and Augmentin and doxycycline. 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 However after a detailed discussion regarding the risk benefits and alternatives he refuses to get admitted to the hospital. With no other recourse I will continue him on oral antibiotics as before and hopefully he'll have his infectious disease consultation this week. 06/16/2017 -- the patient was seen today by the nurse practitioner at infectious disease Ms. Dixon. 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 He has also been advise Lotrimin cream locally. she also had wise IV antibiotics for his condition if it flares up 06/23/17; patient arrives today with drainage bilaterally although the remaining wound on the left posterior calf after cleaning up today "highlighter yellow drainage" did not look too bad. Unfortunately he has had breakdown on the right anterior leg [previously this leg had not been open and he is using a  black stocking] he went to see infectious disease and is been put on clindamycin 150 mg daily, I did not verify the dose although I'm not familiar with using clindamycin in this dosing range, perhaps for prophylaxisoo 06/27/17; I brought this patient back today to follow-up on the wound deterioration on the right lower leg together with surrounding cellulitis. I started him on doxycycline 4 days ago. This area looks better however he comes in today with intense cellulitis on the medial part of his left thigh. This is not have a wound in this area. Extremely tender. We've been using silver alginate to the wounds on the right lower leg left lower leg with bilateral 4 layer compression he is using his external compression pumps once a day 07/04/17; patient's left medial thigh cellulitis looks better. He has not been using his compression pumps as his insert said it was contraindicated with cellulitis. His right leg continues to make improvements all the wounds are still open. We only have one remaining wound on the left  posterior calf. Using silver alginate to all open areas. He is on doxycycline which I started a week ago and should be finishing I gave him Augmentin after Thursday's visit for the severe cellulitis on the left medial thigh which fortunately looks better 07/14/17; the patient's left medial thigh cellulitis has resolved. The cellulitis in his right lower calf on the right also looks better. All of his wounds are stable to improved we've been using silver alginate he has completed the antibiotics I have given him. He has clindamycin 150 mg once a day prescribed by infectious disease for prophylaxis, I've advised him to start this now. We have been using bilateral Unna boots over silver alginate to the wound areas 07/21/17; the patient is been to see infectious disease who noted his recurrent problems with cellulitis. He was not able to tolerate prophylactic clindamycin therefore he is on  amoxicillin 500 twice a day. He also had a second daily dose of Lasix added By Dr. Oneta Rack but he is not taking this. Nor is he being completely compliant with his compression pumps a especially not this week. He has 2 remaining wounds one on the right posterior lateral lower leg and one on the left posterior medial lower leg. 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 The patient has Unna boots bilaterally. 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 He has bilateral Unna boots for compression and silver alginate to his wounds. Arrives today with his legs looking as good as I have seen him in quite some time. Not surprisingly his wounds look better as well with improvement on the right lateral leg venous insufficiency wound and also the left medial leg. He is still using the compression pumps once a day 08/11/17; both legs appear to be doing better wounds on the right lateral and left medial legs look better. Skin on the right leg quite good. He is been using silver alginate as the primary dressing. I'm going to use Anasept gel calcium alginate and maintain all the secondary dressings 08/18/17; the patient continues to actually do quite well. The area on his right lateral leg is just about closed the left medial also looks better although it is still moist in this area. His edema is well controlled we have been using Anasept gel with calcium alginate and the usual secondary dressings, 4 layer compression and once daily use of his compression pumps "always been able to manage 09/01/17; the patient continues to do reasonably well in spite of his trip to [place]. The area on the right lateral leg is epithelialized. Left is much better but still open. He has more edema and more  chronic erythema on the left leg [venous inflammation] 09/08/17; he arrives today with no open wound on the right lateral leg and decently controlled edema. Unfortunately his left leg is not nearly as in his good situation as last week.he apparently had increasing edema starting on Saturday. He edema soaked through into his foot so used a plastic bag to walk around his home. The area on the medial right leg which was his open area is about the same however he has lost surface epithelium on the left lateral which is new and he has significant pain in the Achilles area of the left foot. He is already on  amoxicillin chronically for prophylaxis of cellulitis in the left leg 09/15/17; he is completed a week of doxycycline and the cellulitis in the left posterior leg and Achilles area is as usual improved. He still has a lot of edema and fluid soaking through his dressings. There is no open wound on the right leg. He saw infectious disease NP today 09/22/17;As usual 1 we transition him from our compression wraps to his stockings things did not go well. He has several small open areas on the right leg. He states this was caused by the compression wrap on his skin although he did not wear this with the stockings over them. He has several superficial areas on the left leg medially laterally posteriorly. He does not have any evidence of active cellulitis especially involving the left Achilles The patient is traveling from [facility] Saturday going to [facility]. He states he isn't attempting to get an appointment with a heel objects wound center there to change his dressings. I am not completely certain whether this will work 10/06/17; the patient came in on Friday for a nurse visit and the nurse reported that his legs actually look quite good. He arrives in clinic today for his regular follow-up visit. He has a new wound on his left third toe over the PIP probably caused by friction with his footwear. He  has small areas on the left leg and a very superficial but epithelialized area on the right anterior lateral lower leg. Other than that his legs look as good as I've seen him in quite some time. We have been using silver alginate Review of systems; no chest pain no shortness of breath other than this a 10 point review of systems negative 10/20/17; seen by Dr. Meyer Russel last week. He had taken some antibiotics [doxycycline] that he had left over. Dr. Meyer Russel thought he had candida infection and declined to give him further antibiotics. He has a small wound remaining on the right lateral leg several areas on the left leg including a larger area on the left posterior several left medial and anterior and a small wound on the left lateral. The area on the left dorsal third toe looks a lot better. ROS; Gen.; no fever, respiratory no cough no sputum Cardiac no chest pain other than this 10 point review of system is negative 10/30/17; patient arrives today having fallen in the bathtub 3 days ago. It took him a while to get up. He has pain and maceration in the wounds on his left leg which have deteriorated. He has not been using his pumps he also has some maceration on the right lateral leg. 11/03/17; patient continues to have weeping edema especially in the left leg. This saturates his dressings which were just put on on 12/27. As usual the doxycycline seems to take care of the cellulitis on his lower leg. He is not complaining of fever, chills, or other systemic symptoms. He states his leg feels a lot better on the doxycycline I gave him empirically. He also apparently gets injections at his primary doctor's officeo Rocephin for cellulitis prophylaxis. I didn't ask him about his compression pump compliance today I think that's probably marginal. Arrives in the clinic with all of his dressings primary and secondary macerated full of fluid and he has bilateral edema 11/10/17; the patient's right leg looks some  better although there is still a cluster of wounds on the right lateral. The left leg is inflamed with almost circumferential skin loss medially to laterally  although we are still maintaining anteriorly. He does not have overt cellulitis there is a lot of drainage. He is not using compression pumps. We have been using silver alginate to the wound areas, there are not a lot of options here 11/17/17; the patient's right leg continues to be stable although there is still open wounds, better than last week. The inflammation in the left leg is better. Still loss of surface layer epithelium especially posteriorly. There is no overt cellulitis in the amount of edema and his left leg is really quite good, tells me he is using his compression pumps once a day. 11/24/17; patient's right leg has a small superficial wound laterally this continues to improve. The inflammation in the left leg is still improving however we have continuous surface layer epithelial loss posteriorly. There is no overt cellulitis in the amount of edema in both legs is really quite good. He states he is using his compression pumps on the left leg once a day for 5 out of 7 days 12/01/17; very small superficial areas on the right lateral leg continue to improve. Edema control in both legs is better today. He has continued loss of surface epithelialization and left posterior calf although I think this is better. We have been using silver alginate with large number of absorptive secondary dressings 4 layer on the left Unna boot on the right at his request. He tells me he is using his compression pumps once a day 12/08/17; he has no open area on the right leg is edema control is good here. ooOn the left leg however he has marked erythema and tenderness breakdown of skin. He has what appears to be a wrap injury just distal to the popliteal fossa. This is the pattern of his recurrent cellulitis area and he apparently received penicillin at his  primary physician's office really worked in my view but usually response to doxycycline given it to him several times in the past 12/15/17; the patient had already deteriorated last Friday when he came in for his nurse check. There was swelling erythema and breakdown in the right leg. He has much worse skin breakdown in the left leg as well multiple open areas medially and posteriorly as well as laterally. He tells me he has been using his compression pumps but tells me he feels that the drainage out of his leg is worse when he uses a compression pumps. To be fair to him he is been saying this for a while however I don't know that I have really been listening to this. I wonder if the compression pumps are working properly 12/22/17;. Once again he arrives with severe erythema, weeping edema from the left greater than right leg. Noncompliance with compression pumps. New this visit he is complaining of pain on the lateral aspect of the right leg and the medial aspect of his right thigh. He apparently saw his cardiologist Dr. [name] who was ordered an echocardiogram area and I think this is a step in the right direction 12/25/17; started his doxycycline Monday night. There is still intense erythema of the right leg especially in the anterior thigh although there is less tenderness. The erythema around the wound on the right lateral calf also is less tender. He still complaining of pain in the left heel. His wounds are about the same right lateral left medial left lateral. Superficial but certainly not close to closure. He denies being systemically unwell no fever chills no abdominal pain no diarrhea 12/29/17; back in  follow-up of his extensive right calf and right thigh cellulitis. I added amoxicillin to cover possible doxycycline resistant strep. This seems to of done the trick he is in much less pain there is much less erythema and swelling. He has his echocardiogram at 11:00 this morning. X-ray of the  left heel was also negative. 01/05/18; the patient arrived with his edema under much better control. Now that he is retired he is able to use his compression pumps daily and sometimes twice a day per the patient. He has a wound on the right leg the lateral wound looks better. Area on the left leg also looks a lot better. He has no evidence of cellulitis in his bilateral thighs I had a quick peak at his echocardiogram. He is in normal ejection fraction and normal left ventricular function. He has moderate pulmonary hypertension moderately reduced right ventricular function. One would have to wonder about chronic sleep apnea although he says he doesn't snore. He'll review the echocardiogram with his cardiologist. 01/12/18; the patient arrives with the edema in both legs under exemplary control. He is using his compression pumps daily and sometimes twice daily. His wound on the right lateral leg is just about closed. He still has some weeping areas on the posterior left calf and lateral left calf although everything is just about closed here as well. I have spoken with Aldean Baker who is the patient's nurse practitioner and infectious disease. She was concerned that the patient had not understood that the parenteral penicillin injections he was receiving for cellulitis prophylaxis was actually benefiting him. I don't think the patient actually saw that I would tend to agree we were certainly dealing with less infections although he had a serious one last month. 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 He is healed. He'll be placed in juxtalite compression wraps and increase his lymphedema pumps to twice daily. We will follow up again next week to ensure there are no issues with the new regiment. 01/20/18-he is here for evaluation of bilateral lower extremity weeping edema. Yesterday he was placed in compression wrap to the right lower extremity and compression  stocking to left lower shrubbery. He states he uses lymphedema pumps last night and again this morning and noted a blister to the left lower extremity. On exam he was noted to have drainage to the right lower extremity. He will be placed in Unna boots bilaterally and follow-up next week 01/26/18; patient was actually discharged a week ago to his own juxta light stockings only to return the next day with bilateral lower extremity weeping edema.he was placed in bilateral Unna boots. He arrives today with pain in the back of his left leg. There is no open area on the right leg however there is a linear/wrap injury on the left leg and weeping edema on the left leg posteriorly. I spoke with infectious disease about 10 days ago. They were disappointed that the patient elected to discontinue prophylactic intramuscular penicillin shots as they felt it was particularly beneficial in reducing the frequency of his cellulitis. I discussed this with the patient today. He does not share this view. He'll definitely need antibiotics today. Finally he is traveling to [place] and trauma leaving this Saturday and returning a week later and he does not travel with his pumps. He is going by car 01/30/18; patient was seen 4 days ago and brought back in today for review of cellulitis in the left leg posteriorly. I put  him on amoxicillin this really hasn't helped as much as I might like. He is also worried because he is traveling to [facility] trauma by car. Finally we will be rewrapping him. There is no open area on the right leg over his left leg has multiple weeping areas as usual 02/09/18; The same wrap on for 10 days. He did not pick up the last doxycycline I prescribed for him. He apparently took 4 days worth he already had. There is nothing open on his right leg and the edema control is really quite good. He's had damage in the left leg medially and laterally especially probably related to the prolonged use of  Unna boots 02/12/18; the patient arrived in clinic today for a nurse visit/wrap change. He complained of a lot of pain in the left posterior calf. He is taking doxycycline that I previously prescribed for him. Unfortunately even though he used his stockings and apparently used to compression pumps twice a day he has weeping edema coming out of the lateral part of his right leg. This is coming from the lower anterior lateral skin area. 02/16/18; the patient has finished his doxycycline and will finish the amoxicillin 2 days. The area of cellulitis in the left calf posteriorly has resolved. He is no longer having any pain. He tells me he is using his compression pumps at least once a day sometimes twice. 02/23/18; the patient finished his doxycycline and Amoxil last week. On Friday he noticed a small erythematous circle about the size of a quarter on the left lower leg just above his ankle. This rapidly expanded and he now has erythema on the lateral and posterior part of the thigh. This is bright red. Also has an area on the dorsal foot just above his toes and a tender area just below the left popliteal fossa. He came off his prophylactic penicillin injections at his own insistence one or 2 months ago. This is obviously deteriorated since then 03/02/18; patient is on doxycycline and Amoxil. Culture I did last week of the weeping area on the back of his left calf grew group B strep. I have therefore renewed the amoxicillin 500 3 times a day for a further week. He has not been systemically unwell. Still complaining of an area of discomfort right under his left popliteal fossa. There is no open wound on the right leg. He tells me that he is using his pumps twice a day on most days 03/09/18; patient arrives in clinic today completing his amoxicillin today. The cellulitis on his left leg is better. Furthermore he tells me that he had intramuscular penicillin shots that his primary care office today. However  he also states that the wrap on his right leg fell down shortly after leaving clinic last week. He developed a large blister that was present when he came in for a nurse visit later in the week and then he developed intense discomfort around this area.He tells me he is using his compression pumps 03/16/18; the patient has completed his doxycycline. The infectious part of this/cellulitis in the left heel area left popliteal area is a lot better. He has 2 open areas on the right calf. Still areas on the left calf but this is a lot better as well. 03/24/18; the patient arrives complaining of pain in the left popliteal area again. He thinks some of this is wrap injury. He has no open area on the right leg and really no open area on the left calf either  except for the popliteal area. He claims to be compliant with the compression pumps 03/31/18; I gave him doxycycline last week because of cellulitis in the left popliteal area. This is a lot better although the surface epithelium is denuded off and response to this. He arrives today with uncontrolled edema in the right calf area as well as a fingernail injury in the right lateral calf. There is only a few open areas on the left 04/06/18; I gave him amoxicillin doxycycline over the last 2 weeks that the amoxicillin should be completing currently. He is not complaining of any pain or systemic symptoms. The only open areas see has is on the right lateral lower leg paradoxically I cannot see anything on the left lower leg. He tells me he is using his compression pumps twice a day on most days. Silver alginate to the wounds that are open under 4 layer compression 04/13/18; he completed antibiotics and has no new complaints. Using his compression pumps. Silver alginate that anything that's opened 04/20/18; he is using his compression pumps religiously. Silver alginate 4 layer compression anything that's opened. He comes in today with no open wounds on the left leg  but 3 on the right including a new one posteriorly. He has 2 on the right lateral and one on the right posterior. He likes Unna boots on the right leg for reasons that aren't really clear we had the usual 4 layer compression on the left. It may be necessary to move to the 4 layer compression on the right however for now I left them in the Unna boots 04/27/18; he is using his compression pumps at least once a day. He has still the wounds on the right lateral calf. The area right posteriorly has closed. He does not have an open wound on the left under 4 layer compression however on the dorsal left foot just proximal to the toes and the left third toe 2 small open areas were identified 05/11/18; he has not uses compression pumps. The areas on the right lateral calf have coalesced into one large wound necrotic surface. On the left side he has one small wound anteriorly however the edema is now weeping out of a large part of his left leg. He says he wasn't using his pumps because of the weeping fluid. I explained to him that this is the time he needs to pump more 05/18/18; patient states he is using his compression pumps twice a day. The area on the right lateral large wound albeit superficial. On the left side he has innumerable number of small new wounds on the left calf particularly laterally but several anteriorly and medially. All these appear to have healthy granulated base these look like the remnants of blisters however they occurred under compression. The patient arrives in clinic today with his legs somewhat better. There is certainly less edema, less multiple open areas on the left calf and the right anterior leg looks somewhat better as well superficial and a little smaller. However he relates pain and erythema over the last 3-4 days in the thigh and I looked at this today. He has not been systemically unwell no fever no chills no change in blood sugar values 05/25/18; comes in today in a  better state. The severe cellulitis on his left leg seems better with the Keflex. Not as tender. He has not been systemically unwell ooHard to find an open wound on the left lower leg using his compression pumps twice a day ooThe  confluent wounds on his right lateral calf somewhat better looking. These will ultimately need debridement I didn't do this today. 06/01/18; the severe cellulitis on the left anterior thigh has resolved and he is completed his Keflex. ooThere is no open wound on the left leg however there is a superficial excoriation at the base of the third toe dorsally. Skin on the bottom of his left foot is macerated looking. ooThe left the wounds on the lateral right leg actually looks some better although he did require debridement of the top half of this wound area with an open curet 06/09/18 on evaluation today patient appears to be doing poorly in regard to his right lower extremity in particular this appears to likely be infected he has very thick purulent discharge along with a bright green tent to the discharge. This makes me concerned about the possibility of pseudomonas. He's also having increased discomfort at this point on evaluation. Fortunately there does not appear to be any evidence of infection spreading to the other location at this time. 06/16/18 on evaluation today patient appears to actually be doing fairly well. His ulcer has actually diminished in size quite significantly at this point which is good news. Nonetheless he still does have some evidence of infection he did see infectious disease this morning before coming here for his appointment. I did review the results of their evaluation and their note today. They did actually have him discontinue the Cipro and initiate treatment with linezolid at this time. He is doing this for the next seven days and they recommended a follow-up in four months with them. He is the keep a log of the need for intermittent  antibiotic therapy between now and when he falls back up with infectious disease. This will help them gaze what exactly they need to do to try and help them out. 06/23/18; the patient arrives today with no open wounds on the left leg and left third toe healed. He is been using his compression pumps twice a day. On the right lateral leg he still has a sizable wound but this is a lot better than last time I saw this. 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 Has been using silver alginate under 4 layer compression 06/30/18; the only open wound he has is on the right lateral leg and this looks healthy. No debridement is required. We have been using silver alginate. He does not have an open wound on the left leg. There is apparently some drainage from the dorsal proximal third toe on the left although I see no open wound here. 07/03/18 on evaluation today patient was actually here just for a nurse visit rapid change. However when he was here on Wednesday for his rat change due to having been healed on the left and then developing blisters we initiated the wrap again knowing that he would be back today for [place] to reevaluate and see were at. Unfortunately he has developed some cellulitis into the proximal portion of his right lower extremity even into the region of his thigh. 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 He was placed on one as what at that point. Nonetheless he is done with that and has been tolerating it well otherwise. Doxycycline which in the past really did not seem to be effective for him. Nonetheless I think the best  option may be for [place] to definitely reinitiate the antibiotics for a longer period of time. 07/07/18; since I last saw this patient a week ago he has had a difficult time. At that point he did not have an open wound on his left  leg. We transitioned him into juxta light stockings. He was apparently in the clinic the next day with blisters on the left lateral and left medial lower calf. He also had weeping edema fluid. He was put back into a compression wrap. He was also in the clinic on Friday with intense erythema in his right thigh. Per the patient he was started on Bactrim however that didn't work at all in terms of relieving his pain and swelling. He has taken 3 doxycycline that he had left over from last time and that seems to of helped. He has blistering on the right thigh as well. 07/14/18; the erythema on his right thigh has gotten better with doxycycline that he is finishing. 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 Presumably this cellulitis in the thigh was not related to that although I think this is a bit concerning going forward. He still has an area on the right lateral calf the blister on the right medial calf just below the knee that was discussed above. On the left 2 small open areas left medial and left lateral. Edema control is adequate. He is using his compression pumps twice a day 07/20/18; continued improvement in the condition of both legs especially the edema in his bilateral thighs. He tells me he is been losing weight through a combination of diet and exercise. He is using his compression pumps twice a day. So overall she made to the remaining wounds 07/27/2018; continued improvement in condition of both legs. His edema is well controlled. The area on the right lateral leg is just about closed he had one blisters show up on the medial left upper calf. We have him in 4 layer compression. He is going on a 10-day trip to [place], [place] and [place]. He will be driving. He wants to wear Unna boots because of the lessening amount of constriction. He will not use compression pumps while he is away 08/05/18 on evaluation  today patient actually appears to be doing decently well all things considered in regard to his bilateral lower extremities. The worst ulcer is actually only posterior aspect of his left lower extremity with a four layer compression wrap cut into his leg a couple weeks back. He did have a trip and actually had [employer] for the trip that he is worn since he was last here. Nonetheless he feels like the [employer] actually do better for him his swelling is up a little bit but he also with his trip was not taking his Lasix on a regular set schedule like he was supposed to be. He states that obviously the reason being that he cannot drive and keep going without having to urinate too frequently which makes it difficult. He did not have his pumps with him while he was away either which I think also maybe playing a role here too. 08/13/2018; the patient only has a small open wound on the right lateral calf which is a big improvement in the last month or 2. He also has the area posteriorly just below the posterior fossa on the left which I think was a wrap  injury from several weeks ago. He has no current evidence of cellulitis. He tells me he is back into his compression pumps twice a day. He also tells me that while he was at the laundromat somebody stole a section of his extremitease stockings 08/20/2018; back in the clinic with a much improved state. He only has small areas on the right lateral mid calf which is just about healed. This was is more substantial area for quite a prolonged period of time. He has a small open area on the left anterior tibia. The area on the posterior calf just below the popliteal fossa is closed today. He is using his compression pumps twice a day 08/28/2018; patient has no open wound on the right leg. He has a smattering of open areas on the calf with some weeping lymphedema. More problematically than that it looks as though his wraps of slipped down in his usual  he has very angry upper area of edema just below the right medial knee and on the right lateral calf. He has no open area on his feet. The patient is traveling to [facility] next week. I will send him in an antibiotic. We will continue to wrap the right leg. We ordered extremitease stockings for him last week and I plan to transition the right leg to a stocking when he gets home which will be in 10 days time. As usual he is very reluctant to take his pumps with him when he travels 09/07/2018; patient returns from [facility]. He shows me a picture of his left leg in the mid part of his trip last week with intense fire engine erythema. The picture look bad enough I would have considered sending him to the hospital. Instead he went to the wound care center in [facility]. They did not prescribe him antibiotics but he did take some doxycycline he had leftover from a previous visit. I had given him trimethoprim sulfamethoxazole before he left this did not work according to the patient. This is resulted in some improvement fortunately. He comes back with a large wound on the left posterior calf. Smaller area on the left anterior tibia. Denuded blisters on the dorsal left foot over his toes. Does not have much in the way of wounds on the right leg although he does have a very tender area on the right posterior area just below the popliteal fossa also suggestive of infection. He promises me he is back on his pumps twice a day 09/15/2018; the intense cellulitis in his left lower calf is a lot better. The wound area on the posterior left calf is also so better. However he has reasonably extensive wounds on the dorsal aspect of his second and third toes and the proximal foot just at the base of the toes. There is nothing open on the right leg 09/22/2018; the patient has excellent edema control in his legs bilaterally. He is using his external compression pumps twice a day. He has no open area  on the right leg and only the areas in the left foot dorsally second and third toe area on the left side. He does not have any signs of active cellulitis. 10/06/2018; the patient has good edema control bilaterally. He has no open wound on the right leg. There is a blister in the posterior aspect of his left calf that we had to deal with today. He is using his compression pumps twice a day. There is no signs of active cellulitis. We  have been using silver alginate to the wound areas. He still has vulnerable areas on the base of his left first second toes dorsally He has a his extremities stockings and we are going to transition him today into the stocking on the right leg. He is cautioned that he will need to continue to use the compression pumps twice a day. If he notices uncontrolled edema in the right leg he may need to go to 3 times a day. 10/13/2018; the patient came in for a nurse check on Friday he has a large flaccid blister on the right medial calf just below the knee. We unroofed this. He has this and a new area underneath the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

## 2019-08-17 ENCOUNTER — Telehealth: Payer: Self-pay | Admitting: Family Medicine

## 2019-08-17 NOTE — Telephone Encounter (Signed)
Requesting: Hydrocodone Contract:04/05/19 UDS:n/a Last Visit:07/06/19 Next Visit:10/06/19 Last Refill:06/15/19 (60,0)  Please Advise

## 2019-08-17 NOTE — Telephone Encounter (Signed)
Pt called requesting a refill on his Hydrocodone 5-325.  Pharmacy is [facility].

## 2019-08-18 ENCOUNTER — Encounter (HOSPITAL_BASED_OUTPATIENT_CLINIC_OR_DEPARTMENT_OTHER): Payer: Medicare Other | Admitting: Physician Assistant

## 2019-08-18 ENCOUNTER — Other Ambulatory Visit: Payer: Self-pay

## 2019-08-18 DIAGNOSIS — L97929 Non-pressure chronic ulcer of unspecified part of left lower leg with unspecified severity: Secondary | ICD-10-CM | POA: Diagnosis not present

## 2019-08-18 DIAGNOSIS — L97212 Non-pressure chronic ulcer of right calf with fat layer exposed: Secondary | ICD-10-CM | POA: Diagnosis not present

## 2019-08-18 DIAGNOSIS — I890 Lymphedema, not elsewhere classified: Secondary | ICD-10-CM | POA: Diagnosis not present

## 2019-08-18 DIAGNOSIS — L97512 Non-pressure chronic ulcer of other part of right foot with fat layer exposed: Secondary | ICD-10-CM | POA: Diagnosis not present

## 2019-08-18 DIAGNOSIS — L97129 Non-pressure chronic ulcer of left thigh with unspecified severity: Secondary | ICD-10-CM | POA: Diagnosis not present

## 2019-08-18 DIAGNOSIS — L97812 Non-pressure chronic ulcer of other part of right lower leg with fat layer exposed: Secondary | ICD-10-CM | POA: Diagnosis not present

## 2019-08-18 DIAGNOSIS — E11622 Type 2 diabetes mellitus with other skin ulcer: Secondary | ICD-10-CM | POA: Diagnosis not present

## 2019-08-18 DIAGNOSIS — I87333 Chronic venous hypertension (idiopathic) 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: Secondary | ICD-10-CM | POA: Diagnosis not present

## 2019-08-18 DIAGNOSIS — E1140 Type 2 diabetes mellitus with diabetic neuropathy, unspecified: Secondary | ICD-10-CM | POA: Diagnosis not present

## 2019-08-18 DIAGNOSIS — L97122 Non-pressure chronic ulcer of left thigh with fat layer exposed: Secondary | ICD-10-CM | POA: Diagnosis not present

## 2019-08-18 MED ORDER — HYDROCODONE-ACETAMINOPHEN 5-325 MG PO TABS: ORAL_TABLET | ORAL | 0 refills | Status: DC

## 2019-08-18 NOTE — Telephone Encounter (Signed)
Patient was notified Rx sent. 

## 2019-08-18 NOTE — Progress Notes (Signed)
AADON, GORELIK ([id number]) Visit Report for 08/18/2019 Chief Complaint Document Details Patient Name: Date of Service: ZACKAREY, HOLLEMAN 08/18/2019 7:30 AM Medical Record [id number] Patient Account Number: [id number] Date of Birth/Sex: Treating RN: 1951-06-29 (68 y.o. [name] Primary Care Provider: Nicoletta Ba Other Clinician: Referring Provider: Treating Provider/Extender:[name], [name], [name] in Treatment: 186 Information Obtained from: Patient Chief Complaint patient is here for evaluation venous/lymphedema weeping Electronic Signature(s) Signed: 08/18/2019 6:05:26 PM By: Lenda Kelp PA-C Entered By: Lenda Kelp on 08/18/2019 32:44:01 -------------------------------------------------------------------------------- HPI Details Patient Name: Date of Service: RUTLEDGE, SELSOR 08/18/2019 7:30 AM Medical Record [id number] Patient Account Number: [id number] Date of Birth/Sex: Treating RN: 1951-07-13 (68 y.o. [name] Primary Care Provider: Nicoletta Ba Other Clinician: Referring Provider: Treating Provider/Extender:[name], [name], [name] in Treatment: 186 History of Present Illness HPI Description: Referred by PCP for consultation. Patient has long standing history of BLE venous stasis, no prior ulcerations. At beginning of month, developed cellulitis and weeping. Received IM Rocephin followed by Keflex and resolved. Wears compression stocking, appr 6 months old. Not sure strength. No present drainage. 01/22/16 this is a patient who is a type II diabetic on insulin. 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 He tells me he religiously wears pressure stockings of uncertain strength. He was here with weeping edema about 8 months ago but did not have an open wound. Roughly a month ago he had a reopening on his bilateral legs. He is been using bandages and Neosporin. He does not  complain of pain. 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 Last BUN/creatinine I have is from 11/20/15 at 13 and 1.0 respectively 01/29/16; patient arrives today having tolerated the Profore wrap. He brought in his stockings and these are 18 mmHg stockings he bought from [facility]. The compression here is likely inadequate. He does not complain of pain or excessive drainage she has no systemic symptoms. The wound on the right looks improved as does the one on the left although one on the left is more substantial with still tissue at risk below the actual wound area on the bilateral posterior calf 02/05/16; patient arrives with poor edema control. He states that we did put a 4 layer compression on it last week. No weight appear 5 this. 02/12/16; the area on the posterior right Has healed. The left Has a substantial wound that has necrotic surface eschar that requires a debridement with a curette. 02/16/16;the patient called or a Nurse visit secondary to increased swelling. He had been in earlier in the week with his right leg healed. He was transitioned to is on pressure stocking on the right leg with the only open wound on the left, a substantial area on the left posterior calf. Note he has a history of severe lower extremity edema, he has a history of chronic atrial fibrillation but not heart failure per my notes but I'll need to research this. He is not complaining of chest pain shortness of breath or orthopnea. The intake nurse noted blisters on the previously closed right leg 02/19/16; this is the patient's regular visit day. I see him on Friday with escalating edema new wounds on the right leg and clear signs of at least right ventricular heart failure. I increased his Lasix to 40 twice a day. He is returning currently in follow-up. States he  is noticed a decrease in that the edema 02/26/16  patient's legs have much less edema. There is nothing really open on the right leg. The left leg has improved condition of the large superficial wound on the posterior left leg 03/04/16; edema control is very much better. The patient's right leg wounds have healed. On the left leg he continues to have severe venous inflammation on the posterior aspect of the left leg. There is no tenderness and I don't think any of this is cellulitis. 03/11/16; patient's right leg is married healed and he is in his own stocking. The patient's left leg has deteriorated somewhat. There is a lot of erythema around the wound on the posterior left leg. There is also a significant rim of erythema posteriorly just above where the wrap would've ended there is a new wound in this location and a lot of tenderness. Can't rule out cellulitis in this area. 03/15/16; patient's right leg remains healed and he is in his own stocking. The patient's left leg is much better than last review. His major wound on the posterior aspect of his left Is almost fully epithelialized. He has 3 small injuries from the wraps. Really. Erythema seems a lot better on antibiotics 03/18/16; right leg remains healed and he is in his own stocking. The patient's left leg is much better. The area on the posterior aspect of the left calf is fully epithelialized. His 3 small injuries which were wrap injuries on the left are improved only one seems still open his erythema has resolved 03/25/16; patient's right leg remains healed and he is in his own stocking. There is no open area today on the left leg posterior leg is completely closed up. His wrap injuries at the superior aspect of his leg are also resolved. He looks as though he has some irritation on the dorsal ankle but this is fully epithelialized without evidence of infection. 03/28/16; we discharged this patient on Monday. Transitioned him into his own stocking. There were problems almost immediately with  uncontrolled swelling weeping edema multiple some of which have opened. He does not feel systemically unwell in particular no chest pain no shortness of breath and he does not feel 04/08/16; the edema is under better control with the Profore light wrap but he still has pitting edema. There is one large wound anteriorly 2 on the medial aspect of his left leg and 3 small areas on the superior posterior calf. Drainage is not excessive he is tolerating a Profore light well 04/15/16; put a Profore wrap on him last week. This is controlled is edema however he had a lot of pain on his left anterior foot most of his wounds are healed 04/22/16 once again the patient has denuded areas on the left anterior foot which he states are because his wrap slips up word. He saw his primary physician today is on Lasix 40 twice a day and states that he his weight is down 20 pounds over the last 3 months. 04/29/16: Much improved. left anterior foot much improved. He is now on Lasix 80 mg per day. Much improved edema control 05/06/16; I was hoping to be able to discharge him today however once again he has blisters at a low level of where the compression was placed last week mostly on his left lateral but also his left medial leg and a small area on the anterior part of the left foot. 05/09/16; apparently the patient went home after his appointment on 7/4 later in  the evening developing pain in his upper medial thigh together with subjective fever and chills although his temperature was not taken. The pain was so intense he felt he would probably have to call 911. However he then remembered that he had leftover doxycycline from a previous round of antibiotics and took these. By the next morning he felt a lot better. He called and spoke to one of our nurses and I approved doxycycline over the phone thinking that this was in relation to the wounds we had previously seen although they were definitely were not. The patient feels a  lot better old fever no chills he is still working. Blood sugars are reasonably controlled 05/13/16; patient is back in for review of his cellulitis on his anterior medial upper thigh. He is taking doxycycline this is a lot better. Culture I did of the nodular area on the dorsal aspect of his foot grew MRSA this also looks a lot better. 05/20/16; the patient is cellulitis on the medial upper thigh has resolved. All of his wound areas including the left anterior foot, areas on the medial aspect of the left calf and the lateral aspect of the calf at all resolved. He has a new blister on the left dorsal foot at the level of the fourth toe this was excised. No evidence of infection 05/27/16; patient continues to complain weeping edema. He has new blisterlike wounds on the left anterior lateral and posterior lateral calf at the top of his wrap levels. The area on his left anterior foot appears better. He is not complaining of fever, pain or pruritus in his feet. 05/30/16; the patient's blisters on his left anterior leg posterior calf all look improved. He did not increase the Lasix 100 mg as I suggested because he was going to run out of his 40 mg tablets. He is still having weeping edema of his toes 06/03/16; I renewed his Lasix at 80 mg once a day as he was about to run out when I last saw him. He is on 80 mg of Lasix now. I have asked him to cut down on the excessive amount of water he was drinking and asked him to drink according to his thirst mechanisms 06/12/2016 -- was seen 2 days ago and was supposed to wear his compression stockings at home but he is developed lymphedema and superficial blisters on the left lower extremity and hence came in for a review 06/24/16; the remaining wound is on his left anterior leg. He still has edema coming from between his toes. There is lymphedema here however his edema is generally better than when I last saw this. He has a history of atrial fibrillation but does  not have a known history of congestive heart failure nevertheless I think he probably has this at least on a diastolic basis. 07/01/16 I reviewed his echocardiogram from January 2017. This was essentially normal. He did not have LVH, EF of 55-60%. His right ventricular function was normal although he did have trivial tricuspid and pulmonic regurgitation. This is not audible on exam however. I increased his Lasix to do massive edema in his legs well above his knees I think in early July. He was also drinking an excessive amount of water at the time. 07/15/16; missed his appointment last week because of the Labor Day holiday on Monday. He could not get another appointment later in the week. Started to feel the wrap digging in superiorly so we remove the top half and the bottom  half of his wrap. He has extensive erythema and blistering superiorly in the left leg. Very tender. Very swollen. Edema in his foot with leaking edema fluid. He has not been systemically unwell 07/22/16; the area on the left leg laterally required some debridement. The medial wounds look more stable. His wrap injury wounds appear to have healed. Edema and his foot is better, weeping edema is also better. He tells me he is meeting with the supplier of the external compression pumps at work 08/05/16; the patient was on vacation last week in [facility]. His wrap is been on for an extended period of time. Also over the weekend he developed an extensive area of tender erythema across his anterior medial thigh. He took to doxycycline yesterday that he had leftover from a previous prescription. The patient complains of weeping edema coming out of his toes 08/08/16; I saw this patient on 10/2. He was tender across his anterior thigh. I put him on doxycycline. He returns today in follow-up. He does not have any open wounds on his lower leg, he still has edema weeping into his toes. 08/12/16; patient was seen back urgently today to  follow-up for his extensive left thigh cellulitis/erysipelas. He comes back with a lot less swelling and erythema pain is much better. I believe I gave him Augmentin and Cipro. His wrap was cut down as he stated a roll down his legs. He developed blistering above the level of the wrap that remained. He has 2 open blisters and 1 intact. 08/19/16; patient is been doing his primary doctor who is increased his Lasix from 40-80 once a day or 80 already has less edema. Cellulitis has remained improved in the left thigh. 2 open areas on the posterior left calf 08/26/16; he returns today having new open blisters on the anterior part of his left leg. He has his compression pumps but is not yet been shown how to use some vital representative from the supplier. 09/02/16 patient returns today with no open wounds on the left leg. Some maceration in his plantar toes 09/10/2016 -- Dr. Leanord Hawking had recently discharged him on 09/02/2016 and he has come right back with redness swelling and some open ulcers on his left lower extremity. He says this was caused by trying to apply his compression stockings and he's been unable to use this and has not been able to use his lymphedema pumps. He had some doxycycline leftover and he has started on this a few days ago. 09/16/16; there are no open wounds on his leg on the left and no evidence of cellulitis. 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 He does not complain of symptoms here. I am not clear use using his external compression pumps. 09/23/16; I have not seen this patient in 2 weeks. He canceled his appointment 10 days ago as he was going on vacation. He tells me that on Monday he noticed a large area on his posterior left leg which is been draining copiously and is reopened into a large wound. He is been using ABDs and the external part of his juxtalite, according to our nurse this was not on  properly. 10/07/16; Still a substantial area on the posterior left leg. Using silver alginate 10/14/16; in general better although there is still open area which looks healthy. Still using silver alginate. He reminds me that this happen before he left for [facility]. Today while he was showering in the morning.  He had been using his juxtalite's 10/21/16; the area on his posterior left leg is fully epithelialized. However he arrives today with a large area of tender erythema in his medial and posterior left thigh just above the knee. I have marked the area. Once again he is reluctant to consider hospitalization. I treated him with oral antibiotics in the past for a similar situation with resolution I think with doxycycline however this area it seems more extensive to me. He is not complaining of fever but does have chills and says states he is thirsty. His blood sugar today was in the 140s at home 10/25/16 the area on his posterior left leg is fully epithelialized although there is still some weeping edema. The large area of tenderness and erythema in his medial and posterior left thigh is a lot less tender although there is still a lot of swelling in this thigh. He states he feels a lot better. He is on doxycycline and Augmentin that I started last week. This will continued until Tuesday, December 26. I have ordered a duplex ultrasound of the left thigh rule out DVT whether there is an abscess something that would need to be drained I would also like to know. 11/01/16; he still has weeping edema from a not fully epithelialized area on his left posterior calf. Most of the rest of this looks a lot better. He has completed his antibiotics. His thigh is a lot better. Duplex ultrasound did not show a DVT in the thigh 11/08/16; he comes in today with more Denuded surface epithelium from the posterior aspect of his calf. There is no real evidence of cellulitis. The superior aspect of his wrap appears to  have put quite an indentation in his leg just below the knee and this may have contributed. He does not complain of pain or fever. We have been using silver alginate as the primary dressing. The area of cellulitis in the right thigh has totally resolved. He has been using his compression stockings once a week 11/15/16; the patient arrives today with more loss of epithelium from the posterior aspect of his left calf. He now has a fairly substantial wound in this area. The reason behind this deterioration isn't exactly clear although his edema is not well controlled. He states he feels he is generally more swollen systemically. He is not complaining of chest pain shortness of breath fever. Tells me he has an appointment with his primary physician in early February. He is on 80 mg of oral Lasix a day. He claims compliance with the external compression pumps. He is not having any pain in his legs similar to what he has with his recurrent cellulitis 11/22/16; the patient arrives a follow-up of his large area on his left lateral calf. This looks somewhat better today. He came in earlier in the week for a dressing change since I saw him a week ago. He is not complaining of any pain no shortness of breath no chest pain 11/28/16; the patient arrives for follow-up of his large area on the left lateral calf this does not look better. In fact it is larger weeping edema. The surface of the wound does not look too bad. We have been using silver alginate although I'm not certain that this is a dressing issue. 12/05/16; again the patient follows up for a large wound on the left lateral and left posterior calf this does not look better. There continues to be weeping edema necrotic surface tissue. More worrisome than  this once again there is erythema below the wound involving the distal Achilles and heel suggestive of cellulitis. He is on his feet working most of the day of this is not going well. We are changing his  dressing twice a week to facilitate the drainage. 12/12/16; not much change in the overall dimensions of the large area on the left posterior calf. This is very inflamed looking. I gave him an. Doxycycline last week does not really seem to have helped. He found the wrap very painful indeed it seems to of dog into his legs superiorly and perhaps around the heel. He came in early today because the drainage had soaked through his dressings. 12/19/16- patient arrives for follow-up evaluation of his left lower extremity ulcers. He states that he is using his lymphedema pumps once daily when there is "no drainage". He admits to not using his lipedema pumps while under current treatment. His blood sugars have been consistently between 150-200. 12/26/16; the patient is not using his compression pumps at home because of the wetness on his feet. I've advised him that I think it's important for him to use this daily. He finds his feet too wet, he can put a plastic bag over his legs while he is in the pumps. Otherwise I think will be in a vicious circle. We are using silver alginate to the major area on his left posterior calf 01/02/17; the patient's posterior left leg has further of all into 3 open wounds. All of them covered with a necrotic surface. He claims to be using his compression pumps once a day. His edema control is marginal. Continue with silver alginate 01/10/17; the patient's left posterior leg actually looks somewhat better. There is less edema, less erythema. Still has 3 open areas covered with a necrotic surface requiring debridement. He claims to be using his compression pumps once a day his edema control is better 01/17/17; the patient's left posterior calf look better last week when I saw him and his wrap was changed 2 days ago. He has noted increasing pain in the left heel and arrives today with much larger wounds extensive erythema extending down into the entire heel area especially tender  medially. He is not systemically unwell CBGs have been controlled no fever. Our intake nurse showed me limegreen drainage on his AVD pads. 01/24/17; his usual this patient responds nicely to antibiotics last week giving him Levaquin for presumed Pseudomonas. The whole entire posterior part of his leg is much better much less inflamed and in the case of his Achilles heel area much less tender. He has also had some epithelialization posteriorly there are still open areas here and still draining but overall considerably better 01/31/17- He has continue to tolerate the compression wraps. he states that he continues to use the lymphedema pumps daily, and can increase to twice daily on the weekends. He is voicing no complaints or concerns regarding his LLE ulcers 02/07/17-he is here for follow-up evaluation. He states that he noted some erythema to the left medial and anterior thigh, which he states is new as of yesterday. He is concerned about recurrent cellulitis. He states his blood sugars have been slightly elevated, this morning in the 180s 02/14/17; he is here for follow-up evaluation. When he was last here there was erythema superiorly from his posterior wound in his anterior thigh. He was prescribed Levaquin however a culture of the wound surface grew MRSA over the phone I changed him to doxycycline on Monday and things  seem to be a lot better. 02/24/17; patient missed his appointment on Friday therefore we changed his nurse visit into a physician visit today. Still using silver alginate on the large area of the posterior left thigh. He isn't new area on the dorsal left second toe 03/03/17; actually better today although he admits he has not used his external compression pumps in the last 2 days or so because of work responsibilities over the weekend. 03/10/17; continued improvement. External compression pumps once a day almost all of his wounds have closed on the posterior left calf. Better edema  control 03/17/17; in general improved. He still has 3 small open areas on the lateral aspect of his left leg however most of the area on the posterior part of his leg is epithelialized. He has better edema control. He has an ABD pad under his stocking on the right anterior lower leg although he did not let us look at that today. 03/24/17; patient arrives back in clinic today with no open areas however there are areas on the posterior left calf and anterior left calf that are less than 100% epithelialized. His edema is well controlled in the left lower leg. There is some pitting edema probably lymphedema in the left upper thigh. He uses compression pumps at home once per day. I tried to get him to do this twice a day although he is very reticent. 04/01/2017 -- for the last 2 days he's had significant redness, tenderness and weeping and came in for an urgent visit today. 04/07/17; patient still has 6 more days of doxycycline. He was seen by Dr. Con Memos last Wednesday for cellulitis involving the posterior aspect, lateral aspect of his Involving his heel. For the most part he is better there is less erythema and less weeping. He has been on his feet for 12 hours 2 over the weekend. Using his compression pumps once a day 04/14/17 arrives today with continued improvement. Only one area on the posterior left calf that is not fully epithelialized. He has intense bilateral venous inflammation associated with his chronic venous insufficiency disease and secondary lymphedema. We have been using silver alginate to the left posterior calf wound In passing he tells [place] today that the right leg but we have not seen in quite some time has an open area on it but he doesn't want [place] to look at this today states he will show this to [place] next week. 04/21/17; there is no open area on his left leg although he still reports some weeping edema. He showed [place] his right leg today which is the first time we've seen this leg in a  long time. He has a large area of open wound on the right leg anteriorly healthy granulation. Quite a bit of swelling in the right leg and some degree of venous inflammation. He told us about the right leg in passing last week but states that deterioration in the right leg really only happened over the weekend 04/28/17; there is no open area on the left leg although there is an irritated part on the posterior which is like a wrap injury. The wound on the right leg which was new from last week at least to [place] is a lot better. 05/05/17; still no open area on the left leg. Patient is using his new compression stocking which seems to be doing a good job of controlling the edema. He states he is using his compression pumps once per day. The right leg still has an  open wound although it is better in terms of surface area. Required debridement. A lot of pain in the posterior right Achilles marked tenderness. Usually this type of presentation this patient gives concern for an active cellulitis 05/12/17; patient arrives today with his major wound from last week on the right lateral leg somewhat better. Still requiring debridement. He was using his compression stocking on the left leg however that is reopened with superficial wounds anteriorly he did not have an open wound on this leg previously. He is still using his juxta light's once daily at night. He cannot find the time to do this in the morning as he has to be at work by 7 AM 05/19/17; right lateral leg wound looks improved. No debridement required. The concerning area is on the left posterior leg which appears to almost have a subcutaneous hemorrhagic component to it. We've been using silver alginate to all the wounds 05/26/17; the right lateral leg wound continues to look improved. However the area on the left posterior calf is a tightly adherent surface. [place] using silver alginate. Because of the weeping edema in his legs there is very little  good alternatives. 06/02/17; the patient left here last week looking quite good. Major wound on the left posterior calf and a small one on the right lateral calf. Both of these look satisfactory. He tells me that by Wednesday he had noted increased pain in the left leg and drainage. He called on Thursday and Friday to get an appointment here but we were blocked. He did not go to urgent care or his primary physician. He thinks he had a fever on Thursday but did not actually take his temperature. He has not been using his compression pumps on the left leg because of pain. I advised him to go to the emergency room today for IV antibiotics for stents of left leg cellulitis but he has refused I have asked him to take 2 days off work to keep his leg elevated and he has refused this as well. In view of this I'm going to call him and Augmentin and doxycycline. 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 However after a detailed discussion regarding the risk benefits and alternatives he refuses to get admitted to the hospital. With no other recourse I will continue him on oral antibiotics as before and hopefully he'll have his infectious disease consultation this week. 06/16/2017 -- the patient was seen today by the nurse practitioner at infectious disease Ms. Dixon. 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 He has also been advise Lotrimin cream locally. she also had wise IV antibiotics for his condition if it flares up 06/23/17; patient arrives today with drainage bilaterally although the  remaining wound on the left posterior calf after cleaning up today "highlighter yellow drainage" did not look too bad. Unfortunately he has had breakdown on the right anterior leg [previously this leg had not been open and he is using a black stocking] he went to see infectious disease and is been put on clindamycin 150 mg daily, I  did not verify the dose although I'm not familiar with using clindamycin in this dosing range, perhaps for prophylaxisoo 06/27/17; I brought this patient back today to follow-up on the wound deterioration on the right lower leg together with surrounding cellulitis. I started him on doxycycline 4 days ago. This area looks better however he comes in today with intense cellulitis on the medial part of his left thigh. This is not have a wound in this area. Extremely tender. We've been using silver alginate to the wounds on the right lower leg left lower leg with bilateral 4 layer compression he is using his external compression pumps once a day 07/04/17; patient's left medial thigh cellulitis looks better. He has not been using his compression pumps as his insert said it was contraindicated with cellulitis. His right leg continues to make improvements all the wounds are still open. We only have one remaining wound on the left posterior calf. Using silver alginate to all open areas. He is on doxycycline which I started a week ago and should be finishing I gave him Augmentin after Thursday's visit for the severe cellulitis on the left medial thigh which fortunately looks better 07/14/17; the patient's left medial thigh cellulitis has resolved. The cellulitis in his right lower calf on the right also looks better. All of his wounds are stable to improved we've been using silver alginate he has completed the antibiotics I have given him. He has clindamycin 150 mg once a day prescribed by infectious disease for prophylaxis, I've advised him to start this now. We have been using  bilateral Unna boots over silver alginate to the wound areas 07/21/17; the patient is been to see infectious disease who noted his recurrent problems with cellulitis. He was not able to tolerate prophylactic clindamycin therefore he is on amoxicillin 500 twice a day. He also had a second daily dose of Lasix added By Dr. Oneta RackMcKeown but he is not taking this. Nor is he being completely compliant with his compression pumps a especially not this week. He has 2 remaining wounds one on the right posterior lateral lower leg and one on the left posterior medial lower leg. 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 The patient has Unna boots bilaterally. 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 He has bilateral Unna boots for compression and silver alginate to his wounds. Arrives today with his legs looking as good as I have seen him in quite some time. Not surprisingly his wounds look better as well with improvement on the right lateral leg venous insufficiency wound and also the left medial leg. He is still using the compression pumps once a day 08/11/17; both legs appear to be doing better wounds on the right lateral and left medial legs look better. Skin on the right leg quite good. He is been using silver alginate as the primary dressing. I'm going to use Anasept gel calcium alginate and maintain all the secondary dressings 08/18/17; the patient continues to actually do quite well. The area on his right lateral leg is just about closed the left medial also looks better although it is still moist in this area. His edema is well controlled we have been using Anasept gel with calcium alginate and the usual secondary dressings, 4 layer compression and once daily  use of his compression  pumps "always been able to manage 09/01/17; the patient continues to do reasonably well in spite of his trip to [place]. The area on the right lateral leg is epithelialized. Left is much better but still open. He has more edema and more chronic erythema on the left leg [venous inflammation] 09/08/17; he arrives today with no open wound on the right lateral leg and decently controlled edema. Unfortunately his left leg is not nearly as in his good situation as last week.he apparently had increasing edema starting on Saturday. He edema soaked through into his foot so used a plastic bag to walk around his home. The area on the medial right leg which was his open area is about the same however he has lost surface epithelium on the left lateral which is new and he has significant pain in the Achilles area of the left foot. He is already on amoxicillin chronically for prophylaxis of cellulitis in the left leg 09/15/17; he is completed a week of doxycycline and the cellulitis in the left posterior leg and Achilles area is as usual improved. He still has a lot of edema and fluid soaking through his dressings. There is no open wound on the right leg. He saw infectious disease NP today 09/22/17;As usual 1 we transition him from our compression wraps to his stockings things did not go well. He has several small open areas on the right leg. He states this was caused by the compression wrap on his skin although he did not wear this with the stockings over them. He has several superficial areas on the left leg medially laterally posteriorly. He does not have any evidence of active cellulitis especially involving the left Achilles The patient is traveling from [facility] Saturday going to [facility]. He states he isn't attempting to get an appointment with a heel objects wound center there to change his dressings. I am not completely certain whether this will work 10/06/17; the patient came in on Friday for a  nurse visit and the nurse reported that his legs actually look quite good. He arrives in clinic today for his regular follow-up visit. He has a new wound on his left third toe over the PIP probably caused by friction with his footwear. He has small areas on the left leg and a very superficial but epithelialized area on the right anterior lateral lower leg. Other than that his legs look as good as I've seen him in quite some time. We have been using silver alginate Review of systems; no chest pain no shortness of breath other than this a 10 point review of systems negative 10/20/17; seen by Dr. Meyer Russel last week. He had taken some antibiotics [doxycycline] that he had left over. Dr. Meyer Russel thought he had candida infection and declined to give him further antibiotics. He has a small wound remaining on the right lateral leg several areas on the left leg including a larger area on the left posterior several left medial and anterior and a small wound on the left lateral. The area on the left dorsal third toe looks a lot better. ROS; Gen.; no fever, respiratory no cough no sputum Cardiac no chest pain other than this 10 point review of system is negative 10/30/17; patient arrives today having fallen in the bathtub 3 days ago. It took him a while to get up. He has pain and maceration in the wounds on his left leg which have deteriorated. He has not been using his pumps  he also has some maceration on the right lateral leg. 11/03/17; patient continues to have weeping edema especially in the left leg. This saturates his dressings which were just put on on 12/27. As usual the doxycycline seems to take care of the cellulitis on his lower leg. He is not complaining of fever, chills, or other systemic symptoms. He states his leg feels a lot better on the doxycycline I gave him empirically. He also apparently gets injections at his primary doctor's officeo Rocephin for cellulitis prophylaxis. I didn't ask him  about his compression pump compliance today I think that's probably marginal. Arrives in the clinic with all of his dressings primary and secondary macerated full of fluid and he has bilateral edema 11/10/17; the patient's right leg looks some better although there is still a cluster of wounds on the right lateral. The left leg is inflamed with almost circumferential skin loss medially to laterally although we are still maintaining anteriorly. He does not have overt cellulitis there is a lot of drainage. He is not using compression pumps. We have been using silver alginate to the wound areas, there are not a lot of options here 11/17/17; the patient's right leg continues to be stable although there is still open wounds, better than last week. The inflammation in the left leg is better. Still loss of surface layer epithelium especially posteriorly. There is no overt cellulitis in the amount of edema and his left leg is really quite good, tells me he is using his compression pumps once a day. 11/24/17; patient's right leg has a small superficial wound laterally this continues to improve. The inflammation in the left leg is still improving however we have continuous surface layer epithelial loss posteriorly. There is no overt cellulitis in the amount of edema in both legs is really quite good. He states he is using his compression pumps on the left leg once a day for 5 out of 7 days 12/01/17; very small superficial areas on the right lateral leg continue to improve. Edema control in both legs is better today. He has continued loss of surface epithelialization and left posterior calf although I think this is better. We have been using silver alginate with large number of absorptive secondary dressings 4 layer on the left Unna boot on the right at his request. He tells me he is using his compression pumps once a day 12/08/17; he has no open area on the right leg is edema control is good here. On the left leg  however he has marked erythema and tenderness breakdown of skin. He has what appears to be a wrap injury just distal to the popliteal fossa. 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 There was swelling erythema and breakdown in the right leg. He has much worse skin breakdown in the left leg as well multiple open areas medially and posteriorly as well as laterally. He tells me he has been using his compression pumps but tells me he feels that the drainage out of his leg is worse when he uses a compression pumps. To be fair to him he is been saying this for a while however I don't know that I have really been listening to this. I wonder if  the compression pumps are working properly 12/22/17;. Once again he arrives with severe erythema, weeping edema from the left greater than right leg. Noncompliance with compression pumps. New this visit he is complaining of pain on the lateral aspect of the right leg and the medial aspect of his right thigh. He apparently saw his cardiologist Dr. Debara Pickett who was ordered an echocardiogram area and I think this is a step in the right direction 12/25/17; started his doxycycline Monday night. There is still intense erythema of the right leg especially in the anterior thigh although there is less tenderness. The erythema around the wound on the right lateral calf also is less tender. He still complaining of pain in the left heel. His wounds are about the same right lateral left medial left lateral. Superficial but certainly not close to closure. He denies being systemically unwell no fever chills no abdominal pain no diarrhea 12/29/17; back in follow-up of his extensive right calf and right thigh  cellulitis. I added amoxicillin to cover possible doxycycline resistant strep. This seems to of done the trick he is in much less pain there is much less erythema and swelling. He has his echocardiogram at 11:00 this morning. X-ray of the left heel was also negative. 01/05/18; the patient arrived with his edema under much better control. Now that he is retired he is able to use his compression pumps daily and sometimes twice a day per the patient. He has a wound on the right leg the lateral wound looks better. Area on the left leg also looks a lot better. He has no evidence of cellulitis in his bilateral thighs I had a quick peak at his echocardiogram. He is in normal ejection fraction and normal left ventricular function. He has moderate pulmonary hypertension moderately reduced right ventricular function. One would have to wonder about chronic sleep apnea although he says he doesn't snore. He'll review the echocardiogram with his cardiologist. 01/12/18; the patient arrives with the edema in both legs under exemplary control. He is using his compression pumps daily and sometimes twice daily. His wound on the right lateral leg is just about closed. He still has some weeping areas on the posterior left calf and lateral left calf although everything is just about closed here as well. I have spoken with Jeri Lager who is the patient's nurse practitioner and infectious disease. She was concerned that the patient had not understood that the parenteral penicillin injections he was receiving for cellulitis prophylaxis was actually benefiting him. I don't think the patient actually saw that I would tend to agree we were certainly dealing with less infections although he had a serious one last month. 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 He is healed. He'll be placed in juxtalite compression wraps and increase his lymphedema pumps to twice daily. We will follow up  again next week to ensure there are no issues with the new regiment. 01/20/18-he is here for evaluation of bilateral lower extremity weeping edema. Yesterday he was placed in compression wrap to the right lower extremity and compression stocking to left lower shrubbery. He states he uses lymphedema pumps last night and again this morning and noted a blister to the left lower extremity. On exam he was noted to have drainage to the right lower extremity. He will be placed in Unna boots bilaterally and follow-up next week 01/26/18; patient was actually discharged a week ago to his own juxta light stockings only to return the next  day with bilateral lower extremity weeping edema.he was placed in bilateral Unna boots. He arrives today with pain in the back of his left leg. There is no open area on the right leg however there is a linear/wrap injury on the left leg and weeping edema on the left leg posteriorly. I spoke with infectious disease about 10 days ago. They were disappointed that the patient elected to discontinue prophylactic intramuscular penicillin shots as they felt it was particularly beneficial in reducing the frequency of his cellulitis. I discussed this with the patient today. He does not share this view. He'll definitely need antibiotics today. Finally he is traveling to [place] and trauma leaving this Saturday and returning a week later and he does not travel with his pumps. He is going by car 01/30/18; patient was seen 4 days ago and brought back in today for review of cellulitis in the left leg posteriorly. I put him on amoxicillin this really hasn't helped as much as I might like. He is also worried because he is traveling to [facility] trauma by car. Finally we will be rewrapping him. There is no open area on the right leg over his left leg has multiple weeping areas as usual 02/09/18; The same wrap on for 10 days. He did not pick up the last doxycycline I prescribed for him. He  apparently took 4 days worth he already had. There is nothing open on his right leg and the edema control is really quite good. He's had damage in the left leg medially and laterally especially probably related to the prolonged use of Unna boots 02/12/18; the patient arrived in clinic today for a nurse visit/wrap change. He complained of a lot of pain in the left posterior calf. He is taking doxycycline that I previously prescribed for him. Unfortunately even though he used his stockings and apparently used to compression pumps twice a day he has weeping edema coming out of the lateral part of his right leg. This is coming from the lower anterior lateral skin area. 02/16/18; the patient has finished his doxycycline and will finish the amoxicillin 2 days. The area of cellulitis in the left calf posteriorly has resolved. He is no longer having any pain. He tells me he is using his compression pumps at least once a day sometimes twice. 02/23/18; the patient finished his doxycycline and Amoxil last week. On Friday he noticed a small erythematous circle about the size of a quarter on the left lower leg just above his ankle. This rapidly expanded and he now has erythema on the lateral and posterior part of the thigh. This is bright red. Also has an area on the dorsal foot just above his toes and a tender area just below the left popliteal fossa. He came off his prophylactic penicillin injections at his own insistence one or 2 months ago. This is obviously deteriorated since then 03/02/18; patient is on doxycycline and Amoxil. Culture I did last week of the weeping area on the back of his left calf grew group B strep. I have therefore renewed the amoxicillin 500 3 times a day for a further week. He has not been systemically unwell. Still complaining of an area of discomfort right under his left popliteal fossa. There is no open wound on the right leg. He tells me that he is using his pumps twice a day on  most days 03/09/18; patient arrives in clinic today completing his amoxicillin today. The cellulitis on his left leg is better. Furthermore  he tells me that he had intramuscular penicillin shots that his primary care office today. However he also states that the wrap on his right leg fell down shortly after leaving clinic last week. He developed a large blister that was present when he came in for a nurse visit later in the week and then he developed intense discomfort around this area.He tells me he is using his compression pumps 03/16/18; the patient has completed his doxycycline. The infectious part of this/cellulitis in the left heel area left popliteal area is a lot better. He has 2 open areas on the right calf. Still areas on the left calf but this is a lot better as well. 03/24/18; the patient arrives complaining of pain in the left popliteal area again. He thinks some of this is wrap injury. He has no open area on the right leg and really no open area on the left calf either except for the popliteal area. He claims to be compliant with the compression pumps 03/31/18; I gave him doxycycline last week because of cellulitis in the left popliteal area. This is a lot better although the surface epithelium is denuded off and response to this. He arrives today with uncontrolled edema in the right calf area as well as a fingernail injury in the right lateral calf. There is only a few open areas on the left 04/06/18; I gave him amoxicillin doxycycline over the last 2 weeks that the amoxicillin should be completing currently. He is not complaining of any pain or systemic symptoms. The only open areas see has is on the right lateral lower leg paradoxically I cannot see anything on the left lower leg. He tells me he is using his compression pumps twice a day on most days. Silver alginate to the wounds that are open under 4 layer compression 04/13/18; he completed antibiotics and has no new complaints. Using  his compression pumps. Silver alginate that anything that's opened 04/20/18; he is using his compression pumps religiously. Silver alginate 4 layer compression anything that's opened. He comes in today with no open wounds on the left leg but 3 on the right including a new one posteriorly. He has 2 on the right lateral and one on the right posterior. He likes Unna boots on the right leg for reasons that aren't really clear we had the usual 4 layer compression on the left. It may be necessary to move to the 4 layer compression on the right however for now I left them in the Unna boots 04/27/18; he is using his compression pumps at least once a day. He has still the wounds on the right lateral calf. The area right posteriorly has closed. He does not have an open wound on the left under 4 layer compression however on the dorsal left foot just proximal to the toes and the left third toe 2 small open areas were identified 05/11/18; he has not uses compression pumps. The areas on the right lateral calf have coalesced into one large wound necrotic surface. On the left side he has one small wound anteriorly however the edema is now weeping out of a large part of his left leg. He says he wasn't using his pumps because of the weeping fluid. I explained to him that this is the time he needs to pump more 05/18/18; patient states he is using his compression pumps twice a day. The area on the right lateral large wound albeit superficial. On the left side he has innumerable number of  small new wounds on the left calf particularly laterally but several anteriorly and medially. All these appear to have healthy granulated base these look like the remnants of blisters however they occurred under compression. The patient arrives in clinic today with his legs somewhat better. There is certainly less edema, less multiple open areas on the left calf and the right anterior leg looks somewhat better as well superficial and a  little smaller. However he relates pain and erythema over the last 3-4 days in the thigh and I looked at this today. He has not been systemically unwell no fever no chills no change in blood sugar values 05/25/18; comes in today in a better state. The severe cellulitis on his left leg seems better with the Keflex. Not as tender. He has not been systemically unwell Hard to find an open wound on the left lower leg using his compression pumps twice a day The confluent wounds on his right lateral calf somewhat better looking. These will ultimately need debridement I didn't do this today. 06/01/18; the severe cellulitis on the left anterior thigh has resolved and he is completed his Keflex. There is no open wound on the left leg however there is a superficial excoriation at the base of the third toe dorsally. Skin on the bottom of his left foot is macerated looking. The left the wounds on the lateral right leg actually looks some better although he did require debridement of the top half of this wound area with an open curet 06/09/18 on evaluation today patient appears to be doing poorly in regard to his right lower extremity in particular this appears to likely be infected he has very thick purulent discharge along with a bright green tent to the discharge. This makes me concerned about the possibility of pseudomonas. He's also having increased discomfort at this point on evaluation. Fortunately there does not appear to be any evidence of infection spreading to the other location at this time. 06/16/18 on evaluation today patient appears to actually be doing fairly well. His ulcer has actually diminished in size quite significantly at this point which is good news. Nonetheless he still does have some evidence of infection he did see infectious disease this morning before coming here for his appointment. I did review the results of their evaluation and their note today. They did actually have him  discontinue the Cipro and initiate treatment with linezolid at this time. He is doing this for the next seven days and they recommended a follow-up in four months with them. He is the keep a log of the need for intermittent antibiotic therapy between now and when he falls back up with infectious disease. This will help them gaze what exactly they need to do to try and help them out. 06/23/18; the patient arrives today with no open wounds on the left leg and left third toe healed. He is been using his compression pumps twice a day. On the right lateral leg he still has a sizable wound but this is a lot better than last time I saw this. 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 Has been using silver alginate under 4 layer compression 06/30/18; the only open wound he has is on the right lateral leg and this looks healthy. No debridement is required. We have been using silver alginate. He does not have an open wound on the left leg. There  is apparently some drainage from the dorsal proximal third toe on the left although I see no open wound here. 07/03/18 on evaluation today patient was actually here just for a nurse visit rapid change. However when he was here on Wednesday for his rat change due to having been healed on the left and then developing blisters we initiated the wrap again knowing that he would be back today for us to reevaluate and see were at. Unfortunately he has developed some cellulitis into the proximal portion of his right lower extremity even into the region of his thigh. 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 He was placed on one as what at that point. Nonetheless he is done with that and has been tolerating it well otherwise. Doxycycline which in the past really did not seem to be effective for him. Nonetheless I think the best  option may be for us to definitely reinitiate the antibiotics for a longer period of time. 07/07/18; since I last saw this patient a week ago he has had a difficult time. At that point he did not have an open wound on his left leg. We transitioned him into juxta light stockings. He was apparently in the clinic the next day with blisters on the left lateral and left medial lower calf. He also had weeping edema fluid. He was put back into a compression wrap. He was also in the clinic on Friday with intense erythema in his right thigh. Per the patient he was started on Bactrim however that didn't work at all in terms of relieving his pain and swelling. He has taken 3 doxycycline that he had left over from last time and that seems to of helped. He has blistering on the right thigh as well. 07/14/18; the erythema on his right thigh has gotten better with doxycycline that he is finishing. 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 Presumably this cellulitis in the thigh was not related to that although I think this is a bit concerning going forward. He still has an area on the right lateral calf the blister on the right medial calf just below the knee that was discussed above. On the left 2 small open areas left medial and left lateral. Edema control is adequate. He is using his compression pumps twice a day 07/20/18; continued improvement in the condition of both legs especially the edema in his bilateral thighs. He tells me he is been losing weight through a combination of diet and exercise. He is using his compression pumps twice a day. So overall she made to the remaining wounds 07/27/2018; continued improvement in condition of both legs. His edema is well controlled. The area on the right lateral leg is just about closed he had one blisters show up on the medial left upper calf. We have him in 4 layer compression. He is going on  a 10-day trip to [place], [place] and [place]. He will be driving. He wants to wear Unna boots because of the lessening amount of constriction. He will not use compression pumps while he is away 08/05/18 on evaluation today patient actually appears to be doing decently well all things considered in regard to his bilateral lower extremities. The worst ulcer is actually only posterior aspect of his left lower extremity with a four layer compression wrap cut into his leg a couple weeks  back. He did have a trip and actually had [employer] for the trip that he is worn since he was last here. Nonetheless he feels like the [employer] actually do better for him his swelling is up a little bit but he also with his trip was not taking his Lasix on a regular set schedule like he was supposed to be. He states that obviously the reason being that he cannot drive and keep going without having to urinate too frequently which makes it difficult. He did not have his pumps with him while he was away either which I think also maybe playing a role here too. 08/13/2018; the patient only has a small open wound on the right lateral calf which is a big improvement in the last month or 2. He also has the area posteriorly just below the posterior fossa on the left which I think was a wrap injury from several weeks ago. He has no current evidence of cellulitis. He tells me he is back into his compression pumps twice a day. He also tells me that while he was at the [place] somebody stole a section of his extremitease stockings 08/20/2018; back in the clinic with a much improved state. He only has small areas on the right lateral mid calf which is just about healed. This was is more substantial area for quite a prolonged period of time. He has a small open area on the left anterior tibia. The area on the posterior calf just below the popliteal fossa is closed today. He is using his compression pumps twice a  day 08/28/2018; patient has no open wound on the right leg. He has a smattering of open areas on the calf with some weeping lymphedema. More problematically than that it looks as though his wraps of slipped down in his usual he has very angry upper area of edema just below the right medial knee and on the right lateral calf. He has no open area on his feet. The patient is traveling to [facility] next week. I will send him in an antibiotic. We will continue to wrap the right leg. We ordered extremitease stockings for him last week and I plan to transition the right leg to a stocking when he gets home which will be in 10 days time. As usual he is very reluctant to take his pumps with him when he travels 09/07/2018; patient returns from [facility]. He shows me a picture of his left leg in the mid part of his trip last week with intense fire engine erythema. The picture look bad enough I would have considered sending him to the hospital. Instead he went to the wound care center in [facility]. They did not prescribe him antibiotics but he did take some doxycycline he had leftover from a previous visit. I had given him trimethoprim sulfamethoxazole before he left this did not work according to the patient. This is resulted in some improvement fortunately. He comes back with a large wound on the left posterior calf. Smaller area on the left anterior tibia. Denuded blisters on the dorsal left foot over his toes. Does not have much in the way of wounds on the right leg although he does have a very tender area on the right posterior area just below the popliteal fossa also suggestive of infection. He promises me he is back on his pumps twice a day 09/15/2018; the intense cellulitis in his left lower calf is a lot better.  The wound area on the posterior left calf is also so better. However he has reasonably extensive wounds on the dorsal aspect of his second and third toes and the proximal  foot just at the base of the toes. There is nothing open on the right leg 09/22/2018; the patient has excellent edema control in his legs bilaterally. He is using his external compression pumps twice a day. He has no open area on the right leg and only the areas in the left foot dorsally second and third toe area on the left side. He does not have any signs of active cellulitis. 10/06/2018; the patient has good edema control bilaterally. He has no open wound on the right leg. There is a blister in the posterior aspect of his left calf that we had to deal with today. He is using his compression pumps twice a day. There is no signs of active cellulitis. We have been using silver alginate to the wound areas. He still has vulnerable areas on the base of his left first second toes dorsally He has a his extremities stockings and we are going to transition him today into the stocking on the right leg. He is cautioned that he will need to continue to use the compression pumps twice a day. If he notices uncontrolled edema in the right leg he may need to go to 3 times a day. 10/13/2018; the patient came in for a nurse check on Friday he has a large flaccid blister on the right medial calf just below the knee. We unroofed this. He has this and a new area underneath the posterior mid calf which was undoubtedly a blister as well. He also has several small areas on the right which is the area we put his extremities stocking on. 10/19/2018; the patient went to see infectious disease this morning I am not sure if that was a routine follow-up in any case the doxycycline I had given him was discontinued and started on linezolid. He has not started this. It is easy to look at his left calf and the inflammation and think this is cellulitis however he is very tender in the tissue just below the popliteal fossa and I have no doubt that there is infection going on here. He states the problem he is having is that with the  compression pumps the edema goes down and then starts walking the wrap falls down. We will see if we can adhere this. He has 1 or 2 minuscule open areas on the right still areas that are weeping on the posterior left calf, the base of his left second and third toes 10/26/18; back today in clinic with quite of skin breakdown in his left anterior leg. This may have been infection the area below the popliteal fossa seems a lot better however tremendous epithelial loss on the left anterior mid tibia area over quite inexpensive tissue. He has 2 blisters on the right side but no other open wound here. 10/29/2018; came in urgently to see [place] today and we worked him in for review. He states that the 4 layer compression on the right leg caused pain he had to cut it down to roughly his mid calf this caused swelling above the wrap and he has blisters and skin breakdown today. As a result of the pain he has not been using his pumps. Both legs are a lot more edematous and there is a lot of weeping fluid. 11/02/18; arrives in clinic with continued difficulties in  the right leg> left. Leg is swollen and painful. multiple skin blisters and new open areas especially laterally. He has not been using his pumps on the right leg. 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 He is not systemically unwell. 11/09/2018; the patient claims he is being compliant with his pumps. He is finished the doxycycline I gave him last week. Culture I did of the wound on the right lateral leg showed a few very resistant methicillin staph aureus. This was resistant to doxycycline. Nevertheless he states the pain in the leg is a lot better which makes me wonder if the cultured organism was not really what was causing the problem nevertheless this is a very dangerous organism to be culturing out of any wound. His right leg is still a lot larger than the left. He is using an [employer] boot on this area,  he blames a 4-layer compression for causing the original skin breakdown which I doubt is true however I cannot talk him out of it. We have been using silver alginate to all of these areas which were initially blisters 11/16/2018; patient is being compliant with his external compression pumps at twice a day. Miraculously he arrives in clinic today with absolutely no open wounds. He has better edema control on the left where he has been using 4 layer compression versus wound of wounds on the right and I pointed this out to him. There is no inflammation in the skin in his lower legs which is also somewhat unusual for him. There is no open wounds on the dorsal left foot. He has extremitease stockings at home and I have asked him to bring these in next week. 11/25/18 patient's lower extremity on examination today on the left appears for the most part to be wound free. He does have an open wound on the lateral aspect of the right lower extremity but this is minimal compared to what I've seen in past. He does request that we go ahead and wrap the left leg as well even though there's nothing open just so hopefully it will not reopen in short order. 1/28; patient has superficial open wounds on the right lateral calf left anterior calf and left posterior calf. His edema control is adequate. 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 We have been using silver alginate as the primary dressing. He claims compliance with his compression pumps 2/4; patient has superficial open wounds on numerous areas of his left calf and again one on the left dorsal foot. The areas on the right lateral calf have healed. The cellulitis that I gave him doxycycline for last week is also resolved this was mostly on the left anterior calf just below the tibial tuberosity. His edema looks fairly well-controlled. He tells me he went to see his primary  doctor today and had blood work ordered 2/11; once again he has several open areas on the left calf left tibial area. Most of these are small and appear to have healthy granulation. He does not have anything open on the right. The edema and control in his thighs is pretty good which is usually a good indication he has been using his pumps as requested. 2/18; he continues to have several small areas on the left calf and left tibial area. Most of these are small healthy granulation. We put him in his stocking on the right  leg last week and he arrives with a superficial open area over the right upper tibia and a fairly large area on the right lateral tibia in similar condition. His edema control actually does not look too bad, he claims to be using his compression pumps twice a day 2/25. Continued small areas on the left calf and left tibial area. New areas especially on the right are identified just below the tibial tuberosity and on the right upper tibia itself. There are also areas of weeping edema fluid even without an obvious wound. He does not have a considerable degree of lymphedema but clearly there is more edema here than his skin can handle. He states he is using the pumps twice a day. We have an Unna boot on the right and 4 layer compression on the left. 3/3; he continues to have an area on the right lateral calf and right posterior calf just below the popliteal fossa. There is a fair amount of tenderness around the wound on the popliteal fossa but I did not see any evidence of cellulitis, could just be that the wrap came down and rubbed in this area. He does not have an open area on the left leg however there is an area on the left dorsal foot at the base of the third toe We have been using silver alginate to all wound areas 3/10; he did not have an open area on his left leg last time he was here a week ago. Today he arrives with a horizontal wound just below the tibial tuberosity and an  area on the left lateral calf. He has intense erythema and tenderness in this area. The area is on the right lateral calf and right posterior calf better than last week. We have been using silver alginate as usual 3/18 - Patient returns with 3 small open areas on left calf, and 1 small open area on right calf, the skin looks ok with no significant erythema, he continues the UNA boot on right and 4 layer compression on left. The right lateral calf wound is closed , the right posterior is small area. we will continue silver alginate to the areas. 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 He is been tolerating the dressing changes without complication. Fortunately she has made excellent improvement in regard to the overall status of his wounds. Unfortunately every time we cease wrapping him he ends up reopening in causing more significant issues at that point. Again I'm unsure of the best direction to take although I think the lymphedema clinic may be appropriate for him. 02/03/19 on evaluation today patient appears to be doing well in regard to the wounds that we saw him for last week unfortunately he has a new area on the proximal portion of his right medial/posterior lower extremity where the wrap somewhat slowed down and caused swelling and a blister to rub and open. Unfortunately this is the only opening that he has on either leg at this point. 02/17/19 on evaluation today patient's bilateral lower extremities appear to be doing well. He still completely healed in regard to the left lower extremity. In regard to the right lower extremity the area where the wrap and slid down and caused the blister still seems to be slightly open although this is dramatically better than during the  last evaluation two  weeks ago. I'm very pleased with the way this stands overall. 03/03/19 on evaluation today patient appears to be doing well in regard to his right lower extremity in general although he did have a new blister open this does not appear to be showing any evidence of active infection at this time. Fortunately there's No fevers, chills, nausea, or vomiting noted at this time. Overall I feel like he is making good progress it does feel like that the right leg will we perform the [employer] seems to do with a bit better than three layer wrap on the left which slid down on him. We may switch to doing bilateral in the book wraps. 5/4; I have not seen Mr. Metter in quite some time. According to our case manager he did not have an open wound on his left leg last week. He had 1 remaining wound on the right posterior medial calf. He arrives today with multiple openings on the left leg probably were blisters and/or wrap injuries from Unna boots. I do not think the Unna boot's will provide adequate compression on the left. I am also not clear about the frequency he is using the compression pumps. 03/17/19 on evaluation today patient appears to be doing excellent in regard to his lower extremities compared to last week's evaluation apparently. He had gotten significantly worse last week which is unfortunate. The [employer] wrap on the left did not seem to do very well for him at all and in fact it didn't control his swelling significantly enough he had an additional outbreak. Subsequently we go back to the four layer compression wrap on the left. This is good news. At least in that he is doing better and the wound seem to be killing him. He still has not heard anything from the lymphedema clinic. 03/24/19 on evaluation today patient actually appears to be doing much better in regard to his bilateral lower Trinity as compared to last week when I saw him. Fortunately there's no signs of active infection at this time. He  has been tolerating the dressing changes without complication. Overall I'm extremely pleased with the progress and appearance in general. 04/07/19 on evaluation today patient appears to be doing well in regard to his bilateral lower extremities. His swelling is significantly down from where it was previous. With that being said he does have a couple blisters still open at this point but fortunately nothing that seems to be too severe and again the majority of the larger openings has healed at this time. 04/14/19 on evaluation today patient actually appears to be doing quite well in regard to his bilateral lower extremities in fact I'm not even sure there's anything significantly open at this time at any site. Nonetheless he did have some trouble with these wraps where they are somewhat irritating him secondary to the fact that he has noted that the graph wasn't too close down to the end of this foot in a little bit short as well up to his knee. Otherwise things seem to be doing quite well. 04/21/19 upon evaluation today patient's wound bed actually showed evidence of being completely healed in regard to both lower extremities which is excellent news. There does not appear to be any signs of active infection which is also good news. I'm very pleased in this regard. No fevers, chills, nausea, or vomiting noted at this time. 04/28/19 on evaluation today patient appears to be doing a little bit worse in regard  to both lower extremities on the left mainly due to the fact that when he went infection disease the wrap was not wrapped quite high enough he developed a blister above this. On the right he is a small open area of nothing too significant but again this is continuing to give him some trouble he has been were in the Velcro compression that he has at home. 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 He's been tolerating the dressing changes  without complication. Fortunately there's no signs of active infection at this time. No fevers, chills, nausea, or vomiting noted at this time. We have been trying to get an appointment with her lymphedema clinic in [facility] but unfortunately nobody can get them on phone with not been able to even fax information over the patient likewise is not been able to get in touch with them. Overall I'm not sure exactly what's going on here with to reach out again today. 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 Still having a lot of drainage unfortunately. He tells me especially in the left but even on the right. There's no signs of active infection which is good news we've been using so ratcheted up to this point. 05/19/19 on evaluation today patient actually appears to be doing quite well with regard to his left lower extremity which is great news. Fortunately in regard to the right lower extremity has an issues with his wrap and he subsequently did remove this from what I'm understanding. Nonetheless long story short is what he had rewrapped once he removed it subsequently had maggots underneath this wrap whenever he came in for evaluation today. With that being said they were obviously completely cleaned away by the nursing staff. The visit today which is excellent news. However he does appear to potentially have some infection around the right ankle region where the maggots were located as well. He will likely require anabiotic therapy today. 05/26/19 on evaluation today patient actually appears to be doing much better in regard to his bilateral lower extremities. I feel like the infection is under much better control. With that being said there were maggots noted when the wrap was removed yet again today. Again this could have potentially been left over from previous although at this time there does  not appear to be any signs of significant drainage there was obviously on the wrap some drainage as well this contracted gnats or otherwise. Either way I do not see anything that appears to be doing worse in my pinion and in fact I think his drainage has slowed down quite significantly likely mainly due to the fact to his infection being under better control. 06/02/2019 on evaluation today patient actually appears to be doing well with regard to his bilateral lower extremities there is no signs of active infection at this time which is great news. With that being said he does have several open areas more so on the right than the left but nonetheless these are all significantly better than previously noted. 06/09/2019 on evaluation today patient actually appears to be doing well. His wrap stayed up and he did not cause any problems he had more drainage on the right compared to the left but overall I do not see any major issues at this time which is great news. 06/16/2019 on evaluation today patient appears to be  doing excellent with regard to his lower extremities the only area that is open is a new blister that can have opened as of today on the medial ankle on the left. Other than this he really seems to be doing great I see no major issues at this point. 06/23/2019 on evaluation today patient appears to be doing quite well with regard to his bilateral lower extremities. In fact he actually appears to be almost completely healed there is a small area of weeping noted of the right lower extremity just above the ankle. Nonetheless fortunately there is no signs of active infection at this time which is good news. No fevers, chills, nausea, vomiting, or diarrhea. 8/24; the patient arrived for a nurse visit today but complained of very significant pain in the left leg and therefore I was asked to look at this. Noted that he did not have an open area on the left leg last week nevertheless this was wrapped.  The patient states that he is not been able to put his compression pumps on the left leg because of the discomfort. He has not been systemically unwell 06/30/2019 on evaluation today patient unfortunately despite being excellent last week is doing much worse with regard to his left lower extremity today. In fact he had to come in for a nurse on Monday where his left leg had to be rewrapped due to excessive weeping Dr. Leanord Hawking placed him on doxycycline at that point. Fortunately there is no signs of active infection Systemically at this time which is good news. 07/07/2019 in regard to the patient's wounds today he actually seems to be doing well with his right lower extremity there really is nothing open or draining at this point this is great news. Unfortunately the left lower extremity is given him additional trouble at this time. There does not appear to be any signs of active infection nonetheless he does have a lot of edema and swelling noted at this point as well as blistering all of which has led to a much more poor appearing leg at this time compared to where it was 2 weeks ago when it was almost completely healed. Obviously this is a little discouraging for the patient. He is try to contact the lymphedema clinic in [place] he has not been able to get through to them. 07/14/2019 on evaluation today patient actually appears to be doing slightly better with regard to his left lower extremity ulcers. Overall I do feel like at least at the top of the wrap that we have been placing this area has healed quite nicely and looks much better. The remainder of the leg is showing signs of improvement. Unfortunately in the thigh area he still has an open region on the left and again on the right he has been utilizing just a Band-Aid on an area that also opened on the thigh. Again this is an area that were not able to wrap although we did do an Ace wrap to provide some compression that something that  obviously is a little less effective than the compression wraps we have been using on the lower portion of the leg. He does have an appointment with the lymphedema clinic in [facility] on Friday. 07/21/2019 on evaluation today patient appears to be doing better with regard to his lower extremity ulcers. He has been tolerating the dressing changes without complication. Fortunately there is no signs of active infection at this time. No fevers, chills, nausea, vomiting, or diarrhea. I did receive  the paperwork from the physical therapist at the lymphedema clinic in [place]. Subsequently I signed off on that this morning and sent that back to him for further progression with the treatment plan. 07/28/2019 on evaluation today patient appears to be doing very well with regard to his right lower extremity where I do not see any open wounds at this point. Fortunately he is feeling great as far as that is concerned as well. In regard to the left lower extremity he has been having issues with still several areas of weeping and edema although the upper leg is doing better his lower leg still I think is going require the compression wrap at this time. No fevers, chills, nausea, vomiting, or diarrhea. 08/04/2019 on evaluation today patient unfortunately is having new wounds on the right lower extremity. Again we have been using Unna boot wrap on that side. We switched him to using his juxta lite wrap at home. With that being said he tells me he has been using it although his legs extremely swollen and to be honest really does not appear that he has been. I cannot know that for sure however. Nonetheless he has multiple new wounds on the right lower extremity at this time. Obviously we will have to see about getting this rewrapped for him today. 08/11/2019 on evaluation today patient appears to be doing fairly well with regard to his wounds. He has been tolerating the dressing changes including the  compression wraps without complication. He still has a lot of edema in his upper thigh regions bilaterally he is supposed to be seeing the lymphedema clinic on the 15th of this month once his wraps arrive for the upper part of his legs. 08/18/2019 on evaluation today patient appears to be doing well with regard to his bilateral lower extremities at this point. He has been tolerating the dressing changes without complication. Fortunately there is no signs of active infection which is also good news. He does have a couple weeping areas on the first and second toe of the right foot he also has just a small area on the left foot upper leg and a small area on the left lower leg but overall he is doing quite well in my opinion. He is supposed to be getting his wraps shortly in fact tomorrow and then subsequently is seeing the lymphedema clinic next Wednesday on the 21st. Of note he is also leaving on the 25th to go on vacation for a week to the beach. For that reason and since there is some uncertainty about what there can be doing at lymphedema clinic next Wednesday I am get a make an appointment for next Friday here for [place] to see what we need to do for him prior to him leaving for vacation. Electronic Signature(s) Signed: 08/18/2019 6:05:26 PM By: Lenda Kelp PA-C Entered By: Lenda Kelp on 08/18/2019 08:41:56 -------------------------------------------------------------------------------- Physical Exam Details Patient Name: Date of Service: HARLAN, ERVINE 08/18/2019 7:30 AM Medical Record [id number] Patient Account Number: [id number] Date of Birth/Sex: Treating RN: 1951/08/08 (68 y.o. [name] Primary Care Provider: Nicoletta Ba Other Clinician: Referring Provider: Treating Provider/Extender:[name], [name], [name] in Treatment: 186 Constitutional Well-nourished and well-hydrated in no acute distress. Respiratory normal breathing without difficulty.  clear to auscultation bilaterally. Cardiovascular regular rate and rhythm with normal S1, S2. Psychiatric this patient is able to make decisions and demonstrates good insight into disease process. Alert and Oriented x 3. pleasant and cooperative. Notes Upon inspection  today patient's legs actually appear to be doing quite well all things considered. There is no signs of active infection at this time. No fevers, chills, nausea, vomiting, or diarrhea. Electronic Signature(s) Signed: 08/18/2019 6:05:26 PM By: Lenda Kelp PA-C Entered By: Lenda Kelp on 08/18/2019 08:42:21 -------------------------------------------------------------------------------- Physician Orders Details Patient Name: Date of Service: SHIHAB, [place] 08/18/2019 7:30 AM Medical Record [id number] Patient Account Number: [id number] Date of Birth/Sex: Treating RN: [birth year] (68 y.o. 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 - Friday August 27, 2019. Dressing Change Frequency Do not change entire dressing for one week. - both legs Other: - change right toes dressing as needed for drainage Skin Barriers/Peri-Wound Care Moisturizing lotion - to leg TCA Cream or Ointment - both  legs Wound Cleansing May shower with protection. Primary Wound Dressing Wound #137 Right,Proximal,Medial Lower Leg Calcium Alginate with Silver Wound #138 Right,Anterior Toe Second Calcium Alginate with Silver - apply to right great toe as well. Wound #139 Left,Anterior Upper Leg Calcium Alginate with Silver Secondary Dressing Wound #137 Right,Proximal,Medial Lower Leg ABD pad Wound #138 Right,Anterior Toe Second Kerlix/Rolled Gauze - apply to right great toe as well. Dry Gauze - apply to right great toe as well. Wound #139 Left,Anterior Upper Leg Foam Border Edema Control 4 layer compression: Left lower extremity Unna Boot to Right Lower Extremity Avoid standing for long periods of time Elevate legs to the level of the heart or above for 30 minutes daily and/or when sitting, a frequency of: Exercise regularly Segmental Compressive Device. - lymphadema pumps 60 min 2 times per day Electronic Signature(s) Signed: 08/18/2019 5:58:48 PM By: Shawn Stall Signed: 08/18/2019 6:05:26 PM By: Lenda Kelp PA-C Entered By: Shawn Stall on 08/18/2019 08:30:49 -------------------------------------------------------------------------------- Problem List Details Patient Name: Date of Service: AQEEL, NORGAARD 08/18/2019 7:30 AM Medical Record [id number] Patient Account Number: [id number] Date of Birth/Sex: Treating RN: [birth year] (68 y.o. [name] Primary Care Provider: Nicoletta Ba Other Clinician: Referring Provider: Treating Provider/Extender:[name], [name], [name] in Treatment: 186 Active Problems ICD-10 Evaluated Encounter Code Description Active Date Today Diagnosis L97.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 right calf limited to 06/30/2018 No Yes breakdown of skin L97.221 Non-pressure chronic ulcer of left calf limited to 09/30/2016 No Yes breakdown of skin I87.333 Chronic venous hypertension (idiopathic) with ulcer 01/22/2016 No Yes and  inflammation of bilateral lower extremity I89.0 Lymphedema, not elsewhere classified 01/22/2016 No Yes E11.622 Type 2 diabetes mellitus with other skin ulcer 01/22/2016 No Yes 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, 01/22/2016 No Yes unspecified L03.116 Cellulitis of left lower limb 04/01/2017 No Yes Inactive Problems ICD-10 Code Description Active Date Inactive Date 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 06/30/2017 skin L97.521 Non-pressure chronic ulcer of other part of left foot limited to 04/27/2018 04/27/2018 breakdown of skin L03.115 Cellulitis of right lower limb 12/22/2017 12/22/2017 L97.228 Non-pressure chronic ulcer of left  calf with other specified 06/30/2018 06/30/2018 severity L97.511 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 06/30/2018 06/30/2018 breakdown of skin Resolved Problems Electronic Signature(s) Signed: 08/18/2019 6:05:26 PM By: Lenda Kelp PA-C Entered By: Lenda Kelp on 08/18/2019 08:23:15 -------------------------------------------------------------------------------- Progress Note/History and Physical Details Patient Name: Date of Service: GEZA, BERANEK 08/18/2019 7:30 AM Medical Record [id number] Patient Account Number: [id number] Date of Birth/Sex: Treating RN: 07-03-1951 (68 y.o. [name] Primary Care Provider: Nicoletta Ba Other Clinician: Referring Provider: Treating Provider/Extender:[name], [name], [name] in Treatment: 186 Subjective Chief Complaint Information obtained from Patient patient is here for evaluation venous/lymphedema weeping History of Present Illness (HPI) Referred by PCP for consultation. Patient has long standing history of BLE venous stasis, no prior ulcerations. At beginning of month, developed cellulitis and weeping. Received IM Rocephin followed by Keflex and resolved. Wears compression stocking, appr 6  months old. Not sure strength. No present drainage. 01/22/16 this is a patient who is a type II diabetic on insulin. 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 He tells me he religiously wears pressure stockings of uncertain strength. He was here with weeping edema about 8 months ago but did not have an open wound. Roughly a month ago he had a reopening on his bilateral legs. He is been using bandages and Neosporin. He does not complain of pain. 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 Last BUN/creatinine I have is from 11/20/15 at 13 and 1.0 respectively 01/29/16; patient arrives today having tolerated the Profore wrap. He brought in his stockings and these are 18 mmHg stockings he bought from [place]. The compression here is likely inadequate. He does not complain of pain or excessive drainage she has no systemic symptoms. The wound on the right looks improved as does the one on the left although one on the left is more substantial with still tissue at risk below the actual wound area on the bilateral posterior calf 02/05/16; patient arrives with poor edema control. He states that we did put a 4 layer compression on it last week. No weight appear 5 this. 02/12/16; the area on the posterior right Has healed. The left Has a substantial wound that has necrotic surface eschar that requires a debridement with a curette. 02/16/16;the patient called or a Nurse visit secondary to increased swelling. He had been in earlier in the week with his right leg healed. He was transitioned to is on pressure stocking on the right leg with the only open wound on the left, a substantial area on the left posterior calf. Note he has a history of severe lower extremity edema, he has a history of chronic atrial fibrillation but not heart failure per my notes but I'll need to research  this. He is not complaining of chest pain shortness of breath or orthopnea. The intake nurse noted blisters on the previously closed right leg 02/19/16; this is the patient's regular visit day. I see him on Friday with escalating edema new wounds on the right leg and clear signs of at least right ventricular heart failure. I increased his Lasix to 40 twice a day. He is returning currently in follow-up. States he is noticed a decrease in that the edema 02/26/16 patient's legs have much less edema. There is nothing really open on the right leg. The left leg has improved condition  of the large superficial wound on the posterior left leg 03/04/16; edema control is very much better. The patient's right leg wounds have healed. On the left leg he continues to have severe venous inflammation on the posterior aspect of the left leg. There is no tenderness and I don't think any of this is cellulitis. 03/11/16; patient's right leg is married healed and he is in his own stocking. The patient's left leg has deteriorated somewhat. There is a lot of erythema around the wound on the posterior left leg. There is also a significant rim of erythema posteriorly just above where the wrap would've ended there is a new wound in this location and a lot of tenderness. Can't rule out cellulitis in this area. 03/15/16; patient's right leg remains healed and he is in his own stocking. The patient's left leg is much better than last review. His major wound on the posterior aspect of his left Is almost fully epithelialized. He has 3 small injuries from the wraps. Really. Erythema seems a lot better on antibiotics 03/18/16; right leg remains healed and he is in his own stocking. The patient's left leg is much better. The area on the posterior aspect of the left calf is fully epithelialized. His 3 small injuries which were wrap injuries on the left are improved only one seems still open his erythema has resolved 03/25/16; patient's  right leg remains healed and he is in his own stocking. There is no open area today on the left leg posterior leg is completely closed up. His wrap injuries at the superior aspect of his leg are also resolved. He looks as though he has some irritation on the dorsal ankle but this is fully epithelialized without evidence of infection. 03/28/16; we discharged this patient on Monday. Transitioned him into his own stocking. There were problems almost immediately with uncontrolled swelling weeping edema multiple some of which have opened. He does not feel systemically unwell in particular no chest pain no shortness of breath and he does not feel 04/08/16; the edema is under better control with the Profore light wrap but he still has pitting edema. There is one large wound anteriorly 2 on the medial aspect of his left leg and 3 small areas on the superior posterior calf. Drainage is not excessive he is tolerating a Profore light well 04/15/16; put a Profore wrap on him last week. This is controlled is edema however he had a lot of pain on his left anterior foot most of his wounds are healed 04/22/16 once again the patient has denuded areas on the left anterior foot which he states are because his wrap slips up word. He saw his primary physician today is on Lasix 40 twice a day and states that he his weight is down 20 pounds over the last 3 months. 04/29/16: Much improved. left anterior foot much improved. He is now on Lasix 80 mg per day. Much improved edema control 05/06/16; I was hoping to be able to discharge him today however once again he has blisters at a low level of where the compression was placed last week mostly on his left lateral but also his left medial leg and a small area on the anterior part of the left foot. 05/09/16; apparently the patient went home after his appointment on 7/4 later in the evening developing pain in his upper medial thigh together with subjective fever and chills although his  temperature was not taken. The pain was so intense he felt he would  probably have to call 911. However he then remembered that he had leftover doxycycline from a previous round of antibiotics and took these. By the next morning he felt a lot better. He called and spoke to one of our nurses and I approved doxycycline over the phone thinking that this was in relation to the wounds we had previously seen although they were definitely were not. The patient feels a lot better old fever no chills he is still working. Blood sugars are reasonably controlled 05/13/16; patient is back in for review of his cellulitis on his anterior medial upper thigh. He is taking doxycycline this is a lot better. Culture I did of the nodular area on the dorsal aspect of his foot grew MRSA this also looks a lot better. 05/20/16; the patient is cellulitis on the medial upper thigh has resolved. All of his wound areas including the left anterior foot, areas on the medial aspect of the left calf and the lateral aspect of the calf at all resolved. He has a new blister on the left dorsal foot at the level of the fourth toe this was excised. No evidence of infection 05/27/16; patient continues to complain weeping edema. He has new blisterlike wounds on the left anterior lateral and posterior lateral calf at the top of his wrap levels. The area on his left anterior foot appears better. He is not complaining of fever, pain or pruritus in his feet. 05/30/16; the patient's blisters on his left anterior leg posterior calf all look improved. He did not increase the Lasix 100 mg as I suggested because he was going to run out of his 40 mg tablets. He is still having weeping edema of his toes 06/03/16; I renewed his Lasix at 80 mg once a day as he was about to run out when I last saw him. He is on 80 mg of Lasix now. I have asked him to cut down on the excessive amount of water he was drinking and asked him to drink according to his thirst  mechanisms 06/12/2016 -- was seen 2 days ago and was supposed to wear his compression stockings at home but he is developed lymphedema and superficial blisters on the left lower extremity and hence came in for a review 06/24/16; the remaining wound is on his left anterior leg. He still has edema coming from between his toes. There is lymphedema here however his edema is generally better than when I last saw this. He has a history of atrial fibrillation but does not have a known history of congestive heart failure nevertheless I think he probably has this at least on a diastolic basis. 07/01/16 I reviewed his echocardiogram from January 2017. This was essentially normal. He did not have LVH, EF of 55-60%. His right ventricular function was normal although he did have trivial tricuspid and pulmonic regurgitation. This is not audible on exam however. I increased his Lasix to do massive edema in his legs well above his knees I think in early July. He was also drinking an excessive amount of water at the time. 07/15/16; missed his appointment last week because of the Labor Day holiday on Monday. He could not get another appointment later in the week. Started to feel the wrap digging in superiorly so we remove the top half and the bottom half of his wrap. He has extensive erythema and blistering superiorly in the left leg. Very tender. Very swollen. Edema in his foot with leaking edema fluid. He has not been  systemically unwell 07/22/16; the area on the left leg laterally required some debridement. The medial wounds look more stable. His wrap injury wounds appear to have healed. Edema and his foot is better, weeping edema is also better. He tells me he is meeting with the supplier of the external compression pumps at work 08/05/16; the patient was on vacation last week in [facility]. His wrap is been on for an extended period of time. Also over the weekend he developed an extensive area of tender erythema  across his anterior medial thigh. He took to doxycycline yesterday that he had leftover from a previous prescription. The patient complains of weeping edema coming out of his toes 08/08/16; I saw this patient on 10/2. He was tender across his anterior thigh. I put him on doxycycline. He returns today in follow-up. He does not have any open wounds on his lower leg, he still has edema weeping into his toes. 08/12/16; patient was seen back urgently today to follow-up for his extensive left thigh cellulitis/erysipelas. He comes back with a lot less swelling and erythema pain is much better. I believe I gave him Augmentin and Cipro. His wrap was cut down as he stated a roll down his legs. He developed blistering above the level of the wrap that remained. He has 2 open blisters and 1 intact. 08/19/16; patient is been doing his primary doctor who is increased his Lasix from 40-80 once a day or 80 already has less edema. Cellulitis has remained improved in the left thigh. 2 open areas on the posterior left calf 08/26/16; he returns today having new open blisters on the anterior part of his left leg. He has his compression pumps but is not yet been shown how to use some vital representative from the supplier. 09/02/16 patient returns today with no open wounds on the left leg. Some maceration in his plantar toes 09/10/2016 -- Dr. Leanord Hawking had recently discharged him on 09/02/2016 and he has come right back with redness swelling and some open ulcers on his left lower extremity. He says this was caused by trying to apply his compression stockings and he's been unable to use this and has not been able to use his lymphedema pumps. He had some doxycycline leftover and he has started on this a few days ago. 09/16/16; there are no open wounds on his leg on the left and no evidence of cellulitis. 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 He does not complain of  symptoms here. I am not clear use using his external compression pumps. 09/23/16; I have not seen this patient in 2 weeks. He canceled his appointment 10 days ago as he was going on vacation. He tells me that on Monday he noticed a large area on his posterior left leg which is been draining copiously and is reopened into a large wound. He is been using ABDs and the external part of his juxtalite, according to our nurse this was not on properly. 10/07/16; Still a substantial area on the posterior left leg. Using silver alginate 10/14/16; in general better although there is still open area which looks healthy. Still using silver alginate. He reminds me that this happen before he left for [facility]. Today while he was showering in the morning. He had been using his juxtalite's 10/21/16; the area on his posterior left leg is fully epithelialized. However he arrives today with a large area of tender erythema in his medial  and posterior left thigh just above the knee. I have marked the area. Once again he is reluctant to consider hospitalization. I treated him with oral antibiotics in the past for a similar situation with resolution I think with doxycycline however this area it seems more extensive to me. He is not complaining of fever but does have chills and says states he is thirsty. His blood sugar today was in the 140s at home 10/25/16 the area on his posterior left leg is fully epithelialized although there is still some weeping edema. The large area of tenderness and erythema in his medial and posterior left thigh is a lot less tender although there is still a lot of swelling in this thigh. He states he feels a lot better. He is on doxycycline and Augmentin that I started last week. This will continued until Tuesday, December 26. I have ordered a duplex ultrasound of the left thigh rule out DVT whether there is an abscess something that would need to be drained I would also like to  know. 11/01/16; he still has weeping edema from a not fully epithelialized area on his left posterior calf. Most of the rest of this looks a lot better. He has completed his antibiotics. His thigh is a lot better. Duplex ultrasound did not show a DVT in the thigh 11/08/16; he comes in today with more Denuded surface epithelium from the posterior aspect of his calf. There is no real evidence of cellulitis. The superior aspect of his wrap appears to have put quite an indentation in his leg just below the knee and this may have contributed. He does not complain of pain or fever. We have been using silver alginate as the primary dressing. The area of cellulitis in the right thigh has totally resolved. He has been using his compression stockings once a week 11/15/16; the patient arrives today with more loss of epithelium from the posterior aspect of his left calf. He now has a fairly substantial wound in this area. The reason behind this deterioration isn't exactly clear although his edema is not well controlled. He states he feels he is generally more swollen systemically. He is not complaining of chest pain shortness of breath fever. Tells me he has an appointment with his primary physician in early February. He is on 80 mg of oral Lasix a day. He claims compliance with the external compression pumps. He is not having any pain in his legs similar to what he has with his recurrent cellulitis 11/22/16; the patient arrives a follow-up of his large area on his left lateral calf. This looks somewhat better today. He came in earlier in the week for a dressing change since I saw him a week ago. He is not complaining of any pain no shortness of breath no chest pain 11/28/16; the patient arrives for follow-up of his large area on the left lateral calf this does not look better. In fact it is larger weeping edema. The surface of the wound does not look too bad. We have been using silver alginate although I'm not  certain that this is a dressing issue. 12/05/16; again the patient follows up for a large wound on the left lateral and left posterior calf this does not look better. There continues to be weeping edema necrotic surface tissue. More worrisome than this once again there is erythema below the wound involving the distal Achilles and heel suggestive of cellulitis. He is on his feet working most of the day of this  is not going well. We are changing his dressing twice a week to facilitate the drainage. 12/12/16; not much change in the overall dimensions of the large area on the left posterior calf. This is very inflamed looking. I gave him an. Doxycycline last week does not really seem to have helped. He found the wrap very painful indeed it seems to of dog into his legs superiorly and perhaps around the heel. He came in early today because the drainage had soaked through his dressings. 12/19/16- patient arrives for follow-up evaluation of his left lower extremity ulcers. He states that he is using his lymphedema pumps once daily when there is "no drainage". He admits to not using his lipedema pumps while under current treatment. His blood sugars have been consistently between 150-200. 12/26/16; the patient is not using his compression pumps at home because of the wetness on his feet. I've advised him that I think it's important for him to use this daily. He finds his feet too wet, he can put a plastic bag over his legs while he is in the pumps. Otherwise I think will be in a vicious circle. We are using silver alginate to the major area on his left posterior calf 01/02/17; the patient's posterior left leg has further of all into 3 open wounds. All of them covered with a necrotic surface. He claims to be using his compression pumps once a day. His edema control is marginal. Continue with silver alginate 01/10/17; the patient's left posterior leg actually looks somewhat better. There is less edema, less erythema.  Still has 3 open areas covered with a necrotic surface requiring debridement. He claims to be using his compression pumps once a day his edema control is better 01/17/17; the patient's left posterior calf look better last week when I saw him and his wrap was changed 2 days ago. He has noted increasing pain in the left heel and arrives today with much larger wounds extensive erythema extending down into the entire heel area especially tender medially. He is not systemically unwell CBGs have been controlled no fever. Our intake nurse showed me limegreen drainage on his AVD pads. 01/24/17; his usual this patient responds nicely to antibiotics last week giving him Levaquin for presumed Pseudomonas. The whole entire posterior part of his leg is much better much less inflamed and in the case of his Achilles heel area much less tender. He has also had some epithelialization posteriorly there are still open areas here and still draining but overall considerably better 01/31/17- He has continue to tolerate the compression wraps. he states that he continues to use the lymphedema pumps daily, and can increase to twice daily on the weekends. He is voicing no complaints or concerns regarding his LLE ulcers 02/07/17-he is here for follow-up evaluation. He states that he noted some erythema to the left medial and anterior thigh, which he states is new as of yesterday. He is concerned about recurrent cellulitis. He states his blood sugars have been slightly elevated, this morning in the 180s 02/14/17; he is here for follow-up evaluation. When he was last here there was erythema superiorly from his posterior wound in his anterior thigh. He was prescribed Levaquin however a culture of the wound surface grew MRSA over the phone I changed him to doxycycline on Monday and things seem to be a lot better. 02/24/17; patient missed his appointment on Friday therefore we changed his nurse visit into a physician visit  today. Still using silver alginate on the  large area of the posterior left thigh. He isn't new area on the dorsal left second toe 03/03/17; actually better today although he admits he has not used his external compression pumps in the last 2 days or so because of work responsibilities over the weekend. 03/10/17; continued improvement. External compression pumps once a day almost all of his wounds have closed on the posterior left calf. Better edema control 03/17/17; in general improved. He still has 3 small open areas on the lateral aspect of his left leg however most of the area on the posterior part of his leg is epithelialized. He has better edema control. He has an ABD pad under his stocking on the right anterior lower leg although he did not let us look at that today. 03/24/17; patient arrives back in clinic today with no open areas however there are areas on the posterior left calf and anterior left calf that are less than 100% epithelialized. His edema is well controlled in the left lower leg. There is some pitting edema probably lymphedema in the left upper thigh. He uses compression pumps at home once per day. I tried to get him to do this twice a day although he is very reticent. 04/01/2017 -- for the last 2 days he's had significant redness, tenderness and weeping and came in for an urgent visit today. 04/07/17; patient still has 6 more days of doxycycline. He was seen by Dr. Meyer Russel last Wednesday for cellulitis involving the posterior aspect, lateral aspect of his Involving his heel. For the most part he is better there is less erythema and less weeping. He has been on his feet for 12 hours o2 over the weekend. Using his compression pumps once a day 04/14/17 arrives today with continued improvement. Only one area on the posterior left calf that is not fully epithelialized. He has intense bilateral venous inflammation associated with his chronic venous insufficiency disease and secondary  lymphedema. We have been using silver alginate to the left posterior calf wound In passing he tells [place] today that the right leg but we have not seen in quite some time has an open area on it but he doesn't want [place] to look at this today states he will show this to [place] next week. 04/21/17; there is no open area on his left leg although he still reports some weeping edema. He showed [place] his right leg today which is the first time we've seen this leg in a long time. He has a large area of open wound on the right leg anteriorly healthy granulation. Quite a bit of swelling in the right leg and some degree of venous inflammation. He told us about the right leg in passing last week but states that deterioration in the right leg really only happened over the weekend 04/28/17; there is no open area on the left leg although there is an irritated part on the posterior which is like a wrap injury. The wound on the right leg which was new from last week at least to [place] is a lot better. 05/05/17; still no open area on the left leg. Patient is using his new compression stocking which seems to be doing a good job of controlling the edema. He states he is using his compression pumps once per day. The right leg still has an open wound although it is better in terms of surface area. Required debridement. A lot of pain in the posterior right Achilles marked tenderness. Usually this type of presentation this patient  gives concern for an active cellulitis 05/12/17; patient arrives today with his major wound from last week on the right lateral leg somewhat better. Still requiring debridement. He was using his compression stocking on the left leg however that is reopened with superficial wounds anteriorly he did not have an open wound on this leg previously. He is still using his juxta light's once daily at night. He cannot find the time to do this in the morning as he has to be at work by 7 AM 05/19/17; right lateral leg wound  looks improved. No debridement required. The concerning area is on the left posterior leg which appears to almost have a subcutaneous hemorrhagic component to it. We've been using silver alginate to all the wounds 05/26/17; the right lateral leg wound continues to look improved. However the area on the left posterior calf is a tightly adherent surface. [place] using silver alginate. Because of the weeping edema in his legs there is very little good alternatives. 06/02/17; the patient left here last week looking quite good. Major wound on the left posterior calf and a small one on the right lateral calf. Both of these look satisfactory. He tells me that by Wednesday he had noted increased pain in the left leg and drainage. He called on Thursday and Friday to get an appointment here but we were blocked. He did not go to urgent care or his primary physician. He thinks he had a fever on Thursday but did not actually take his temperature. He has not been using his compression pumps on the left leg because of pain. I advised him to go to the emergency room today for IV antibiotics for stents of left leg cellulitis but he has refused I have asked him to take 2 days off work to keep his leg elevated and he has refused this as well. In view of this I'm going to call him and Augmentin and doxycycline. 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 However after a detailed discussion regarding the risk benefits and alternatives he refuses to get admitted to the hospital. With no other recourse I will continue him on oral antibiotics as before and hopefully he'll have his infectious disease consultation this week. 06/16/2017 -- the patient was seen today by the nurse  practitioner at infectious disease Ms. Dixon. 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 He has also been advise Lotrimin cream locally. she also had wise IV antibiotics for his condition if it flares up 06/23/17; patient arrives today with drainage bilaterally although the remaining wound on the left posterior calf after cleaning up today "highlighter yellow drainage" did not look too bad. Unfortunately he has had breakdown on the right anterior leg [previously this leg had not been open and he is using a black stocking] he went to see infectious disease and is been put on clindamycin 150 mg daily, I did not verify the dose although I'm not familiar with using clindamycin in this dosing range, perhaps for prophylaxisoo 06/27/17; I brought this patient back today to follow-up on the wound  deterioration on the right lower leg together with surrounding cellulitis. I started him on doxycycline 4 days ago. This area looks better however he comes in today with intense cellulitis on the medial part of his left thigh. This is not have a wound in this area. Extremely tender. We've been using silver alginate to the wounds on the right lower leg left lower leg with bilateral 4 layer compression he is using his external compression pumps once a day 07/04/17; patient's left medial thigh cellulitis looks better. He has not been using his compression pumps as his insert said it was contraindicated with cellulitis. His right leg continues to make improvements all the wounds are still open. We only have one remaining wound on the left posterior calf. Using silver alginate to all open areas. He is on doxycycline which I started a week ago and should be finishing I gave him Augmentin after Thursday's visit for the severe cellulitis on the  left medial thigh which fortunately looks better 07/14/17; the patient's left medial thigh cellulitis has resolved. The cellulitis in his right lower calf on the right also looks better. All of his wounds are stable to improved we've been using silver alginate he has completed the antibiotics I have given him. He has clindamycin 150 mg once a day prescribed by infectious disease for prophylaxis, I've advised him to start this now. We have been using bilateral Unna boots over silver alginate to the wound areas 07/21/17; the patient is been to see infectious disease who noted his recurrent problems with cellulitis. He was not able to tolerate prophylactic clindamycin therefore he is on amoxicillin 500 twice a day. He also had a second daily dose of Lasix added By Dr. Oneta Rack but he is not taking this. Nor is he being completely compliant with his compression pumps a especially not this week. He has 2 remaining wounds one on the right posterior lateral lower leg and one on the left posterior medial lower leg. 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 The patient has Unna boots bilaterally. 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 He has bilateral Unna boots for compression and silver alginate to his wounds. Arrives today with his legs looking as good as I have seen him in quite some time. Not surprisingly his wounds look better as well with improvement on the right lateral leg venous insufficiency wound and also the left medial leg. He is still using the compression pumps once a day 08/11/17; both legs appear to be doing better wounds on the right lateral and left medial legs look better. Skin on the right leg quite good. He is been using silver alginate as the primary  dressing. I'm going to use Anasept gel calcium alginate and maintain all the secondary dressings 08/18/17; the patient continues to actually do quite well. The area on his right lateral leg is just about closed the left medial also looks better although it is still moist in this area. His edema is well controlled we have been using Anasept gel with calcium alginate and the usual secondary dressings, 4 layer compression and once daily use of his compression pumps "always been able to manage 09/01/17; the patient continues to do reasonably well in spite of his trip to [place]. The area on the right  lateral leg is epithelialized. Left is much better but still open. He has more edema and more chronic erythema on the left leg [venous inflammation] 09/08/17; he arrives today with no open wound on the right lateral leg and decently controlled edema. Unfortunately his left leg is not nearly as in his good situation as last week.he apparently had increasing edema starting on Saturday. He edema soaked through into his foot so used a plastic bag to walk around his home. The area on the medial right leg which was his open area is about the same however he has lost surface epithelium on the left lateral which is new and he has significant pain in the Achilles area of the left foot. He is already on amoxicillin chronically for prophylaxis of cellulitis in the left leg 09/15/17; he is completed a week of doxycycline and the cellulitis in the left posterior leg and Achilles area is as usual improved. He still has a lot of edema and fluid soaking through his dressings. There is no open wound on the right leg. He saw infectious disease NP today 09/22/17;As usual 1 we transition him from our compression wraps to his stockings things did not go well. He has several small open areas on the right leg. He states this was caused by the compression wrap on his skin although he did not wear this with the stockings over  them. He has several superficial areas on the left leg medially laterally posteriorly. He does not have any evidence of active cellulitis especially involving the left Achilles The patient is traveling from [facility] Saturday going to [facility]. He states he isn't attempting to get an appointment with a heel objects wound center there to change his dressings. I am not completely certain whether this will work 10/06/17; the patient came in on Friday for a nurse visit and the nurse reported that his legs actually look quite good. He arrives in clinic today for his regular follow-up visit. He has a new wound on his left third toe over the PIP probably caused by friction with his footwear. He has small areas on the left leg and a very superficial but epithelialized area on the right anterior lateral lower leg. Other than that his legs look as good as I've seen him in quite some time. We have been using silver alginate Review of systems; no chest pain no shortness of breath other than this a 10 point review of systems negative 10/20/17; seen by Dr. Meyer Russel last week. He had taken some antibiotics [doxycycline] that he had left over. Dr. Meyer Russel thought he had candida infection and declined to give him further antibiotics. He has a small wound remaining on the right lateral leg several areas on the left leg including a larger area on the left posterior several left medial and anterior and a small wound on the left lateral. The area on the left dorsal third toe looks a lot better. ROS; Gen.; no fever, respiratory no cough no sputum Cardiac no chest pain other than this 10 point review of system is negative 10/30/17; patient arrives today having fallen in the bathtub 3 days ago. It took him a while to get up. He has pain and maceration in the wounds on his left leg which have deteriorated. He has not been using his pumps he also has some maceration on the right lateral leg. 11/03/17; patient continues  to have weeping edema especially in the left leg. This saturates his dressings which were just put  on on 12/27. As usual the doxycycline seems to take care of the cellulitis on his lower leg. He is not complaining of fever, chills, or other systemic symptoms. He states his leg feels a lot better on the doxycycline I gave him empirically. He also apparently gets injections at his primary doctor's officeo Rocephin for cellulitis prophylaxis. I didn't ask him about his compression pump compliance today I think that's probably marginal. Arrives in the clinic with all of his dressings primary and secondary macerated full of fluid and he has bilateral edema 11/10/17; the patient's right leg looks some better although there is still a cluster of wounds on the right lateral. The left leg is inflamed with almost circumferential skin loss medially to laterally although we are still maintaining anteriorly. He does not have overt cellulitis there is a lot of drainage. He is not using compression pumps. We have been using silver alginate to the wound areas, there are not a lot of options here 11/17/17; the patient's right leg continues to be stable although there is still open wounds, better than last week. The inflammation in the left leg is better. Still loss of surface layer epithelium especially posteriorly. There is no overt cellulitis in the amount of edema and his left leg is really quite good, tells me he is using his compression pumps once a day. 11/24/17; patient's right leg has a small superficial wound laterally this continues to improve. The inflammation in the left leg is still improving however we have continuous surface layer epithelial loss posteriorly. There is no overt cellulitis in the amount of edema in both legs is really quite good. He states he is using his compression pumps on the left leg once a day for 5 out of 7 days 12/01/17; very small superficial areas on the right lateral leg continue  to improve. Edema control in both legs is better today. He has continued loss of surface epithelialization and left posterior calf although I think this is better. We have been using silver alginate with large number of absorptive secondary dressings 4 layer on the left Unna boot on the right at his request. He tells me he is using his compression pumps once a day 12/08/17; he has no open area on the right leg is edema control is good here. ooOn the left leg however he has marked erythema and tenderness breakdown of skin. He has what appears to be a wrap injury just distal to the popliteal fossa. 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 There was swelling erythema and breakdown in the right leg. He has much worse skin breakdown in the left leg as well multiple open areas medially and posteriorly as well as laterally. He tells me he has been using his compression pumps but tells me he feels that the drainage out of his leg is worse when he uses a compression pumps. To be fair to him he is been saying this for a while however I don't know that I have really been listening to this. I wonder if the compression pumps are working properly 12/22/17;. Once again he arrives with severe erythema, weeping edema from the left greater than right leg. Noncompliance with compression pumps. New this visit he  is complaining of pain on the lateral aspect of the right leg and the medial aspect of his right thigh. He apparently saw his cardiologist Dr. [name] who was ordered an echocardiogram area and I think this is a step in the right direction 12/25/17; started his doxycycline Monday night. There is still intense erythema of the right leg  especially in the anterior thigh although there is less tenderness. The erythema around the wound on the right lateral calf also is less tender. He still complaining of pain in the left heel. His wounds are about the same right lateral left medial left lateral. Superficial but certainly not close to closure. He denies being systemically unwell no fever chills no abdominal pain no diarrhea 12/29/17; back in follow-up of his extensive right calf and right thigh cellulitis. I added amoxicillin to cover possible doxycycline resistant strep. This seems to of done the trick he is in much less pain there is much less erythema and swelling. He has his echocardiogram at 11:00 this morning. X-ray of the left heel was also negative. 01/05/18; the patient arrived with his edema under much better control. Now that he is retired he is able to use his compression pumps daily and sometimes twice a day per the patient. He has a wound on the right leg the lateral wound looks better. Area on the left leg also looks a lot better. He has no evidence of cellulitis in his bilateral thighs I had a quick peak at his echocardiogram. He is in normal ejection fraction and normal left ventricular function. He has moderate pulmonary hypertension moderately reduced right ventricular function. One would have to wonder about chronic sleep apnea although he says he doesn't snore. He'll review the echocardiogram with his cardiologist. 01/12/18; the patient arrives with the edema in both legs under exemplary control. He is using his compression pumps daily and sometimes twice daily. His wound on the right lateral leg is just about closed. He still has some weeping areas on the posterior left calf and lateral left calf although everything is just about closed here as well. I have spoken with Aldean Baker who is the patient's nurse practitioner and infectious disease. She was concerned that the patient had not understood that the  parenteral penicillin injections he was receiving for cellulitis prophylaxis was actually benefiting him. I don't think the patient actually saw that I would tend to agree we were certainly dealing with less infections although he had a serious one last month. 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 He is healed. He'll be placed in juxtalite compression wraps and increase his lymphedema pumps to twice daily. We will follow up again next week to ensure there are no issues with the new regiment. 01/20/18-he is here for evaluation of bilateral lower extremity weeping edema. Yesterday he was placed in compression wrap to the right lower extremity and compression stocking to left lower shrubbery. He states he uses lymphedema pumps last night and again this morning and noted a blister to the left lower extremity. On exam he was noted to have drainage to the right lower extremity. He will be placed in Unna boots bilaterally and follow-up next week 01/26/18; patient was actually discharged a week ago to his own juxta light stockings only to return the next day with bilateral lower extremity weeping edema.he was placed in bilateral Unna boots. He arrives today with pain in the back of his left leg. There is no open area on  the right leg however there is a linear/wrap injury on the left leg and weeping edema on the left leg posteriorly. I spoke with infectious disease about 10 days ago. They were disappointed that the patient elected to discontinue prophylactic intramuscular penicillin shots as they felt it was particularly beneficial in reducing the frequency of his cellulitis. I discussed this with the patient today. He does not share this view. He'll definitely need antibiotics today. Finally he is traveling to [place] and trauma leaving this Saturday and returning a week later and he does not travel with his pumps. He is going by car 01/30/18; patient was seen 4 days  ago and brought back in today for review of cellulitis in the left leg posteriorly. I put him on amoxicillin this really hasn't helped as much as I might like. He is also worried because he is traveling to [facility] by car. Finally we will be rewrapping him. There is no open area on the right leg over his left leg has multiple weeping areas as usual 02/09/18; The same wrap on for 10 days. He did not pick up the last doxycycline I prescribed for him. He apparently took 4 days worth he already had. There is nothing open on his right leg and the edema control is really quite good. He's had damage in the left leg medially and laterally especially probably related to the prolonged use of Unna boots 02/12/18; the patient arrived in clinic today for a nurse visit/wrap change. He complained of a lot of pain in the left posterior calf. He is taking doxycycline that I previously prescribed for him. Unfortunately even though he used his stockings and apparently used to compression pumps twice a day he has weeping edema coming out of the lateral part of his right leg. This is coming from the lower anterior lateral skin area. 02/16/18; the patient has finished his doxycycline and will finish the amoxicillin 2 days. The area of cellulitis in the left calf posteriorly has resolved. He is no longer having any pain. He tells me he is using his compression pumps at least once a day sometimes twice. 02/23/18; the patient finished his doxycycline and Amoxil last week. On Friday he noticed a small erythematous circle about the size of a quarter on the left lower leg just above his ankle. This rapidly expanded and he now has erythema on the lateral and posterior part of the thigh. This is bright red. Also has an area on the dorsal foot just above his toes and a tender area just below the left popliteal fossa. He came off his prophylactic penicillin injections at his own insistence one or 2 months ago. This is  obviously deteriorated since then 03/02/18; patient is on doxycycline and Amoxil. Culture I did last week of the weeping area on the back of his left calf grew group B strep. I have therefore renewed the amoxicillin 500 3 times a day for a further week. He has not been systemically unwell. Still complaining of an area of discomfort right under his left popliteal fossa. There is no open wound on the right leg. He tells me that he is using his pumps twice a day on most days 03/09/18; patient arrives in clinic today completing his amoxicillin today. The cellulitis on his left leg is better. Furthermore he tells me that he had intramuscular penicillin shots that his primary care office today. However he also states that the wrap on his right leg fell down shortly after leaving  clinic last week. He developed a large blister that was present when he came in for a nurse visit later in the week and then he developed intense discomfort around this area.He tells me he is using his compression pumps 03/16/18; the patient has completed his doxycycline. The infectious part of this/cellulitis in the left heel area left popliteal area is a lot better. He has 2 open areas on the right calf. Still areas on the left calf but this is a lot better as well. 03/24/18; the patient arrives complaining of pain in the left popliteal area again. He thinks some of this is wrap injury. He has no open area on the right leg and really no open area on the left calf either except for the popliteal area. He claims to be compliant with the compression pumps 03/31/18; I gave him doxycycline last week because of cellulitis in the left popliteal area. This is a lot better although the surface epithelium is denuded off and response to this. He arrives today with uncontrolled edema in the right calf area as well as a fingernail injury in the right lateral calf. There is only a few open areas on the left 04/06/18; I gave him amoxicillin  doxycycline over the last 2 weeks that the amoxicillin should be completing currently. He is not complaining of any pain or systemic symptoms. The only open areas see has is on the right lateral lower leg paradoxically I cannot see anything on the left lower leg. He tells me he is using his compression pumps twice a day on most days. Silver alginate to the wounds that are open under 4 layer compression 04/13/18; he completed antibiotics and has no new complaints. Using his compression pumps. Silver alginate that anything that's opened 04/20/18; he is using his compression pumps religiously. Silver alginate 4 layer compression anything that's opened. He comes in today with no open wounds on the left leg but 3 on the right including a new one posteriorly. He has 2 on the right lateral and one on the right posterior. He likes Unna boots on the right leg for reasons that aren't really clear we had the usual 4 layer compression on the left. It may be necessary to move to the 4 layer compression on the right however for now I left them in the Unna boots 04/27/18; he is using his compression pumps at least once a day. He has still the wounds on the right lateral calf. The area right posteriorly has closed. He does not have an open wound on the left under 4 layer compression however on the dorsal left foot just proximal to the toes and the left third toe 2 small open areas were identified 05/11/18; he has not uses compression pumps. The areas on the right lateral calf have coalesced into one large wound necrotic surface. On the left side he has one small wound anteriorly however the edema is now weeping out of a large part of his left leg. He says he wasn't using his pumps because of the weeping fluid. I explained to him that this is the time he needs to pump more 05/18/18; patient states he is using his compression pumps twice a day. The area on the right lateral large wound albeit superficial. On the left  side he has innumerable number of small new wounds on the left calf particularly laterally but several anteriorly and medially. All these appear to have healthy granulated base these look like the remnants of blisters however  they occurred under compression. The patient arrives in clinic today with his legs somewhat better. There is certainly less edema, less multiple open areas on the left calf and the right anterior leg looks somewhat better as well superficial and a little smaller. However he relates pain and erythema over the last 3-4 days in the thigh and I looked at this today. He has not been systemically unwell no fever no chills no change in blood sugar values 05/25/18; comes in today in a better state. The severe cellulitis on his left leg seems better with the Keflex. Not as tender. He has not been systemically unwell ooHard to find an open wound on the left lower leg using his compression pumps twice a day ooThe confluent wounds on his right lateral calf somewhat better looking. These will ultimately need debridement I didn't do this today. 06/01/18; the severe cellulitis on the left anterior thigh has resolved and he is completed his Keflex. ooThere is no open wound on the left leg however there is a superficial excoriation at the base of the third toe dorsally. Skin on the bottom of his left foot is macerated looking. ooThe left the wounds on the lateral right leg actually looks some better although he did require debridement of the top half of this wound area with an open curet 06/09/18 on evaluation today patient appears to be doing poorly in regard to his right lower extremity in particular this appears to likely be infected he has very thick purulent discharge along with a bright green tent to the discharge. This makes me concerned about the possibility of pseudomonas. He's also having increased discomfort at this point on evaluation. Fortunately there does not appear to be any  evidence of infection spreading to the other location at this time. 06/16/18 on evaluation today patient appears to actually be doing fairly well. His ulcer has actually diminished in size quite significantly at this point which is good news. Nonetheless he still does have some evidence of infection he did see infectious disease this morning before coming here for his appointment. I did review the results of their evaluation and their note today. They did actually have him discontinue the Cipro and initiate treatment with linezolid at this time. He is doing this for the next seven days and they recommended a follow-up in four months with them. He is the keep a log of the need for intermittent antibiotic therapy between now and when he falls back up with infectious disease. This will help them gaze what exactly they need to do to try and help them out. 06/23/18; the patient arrives today with no open wounds on the left leg and left third toe healed. He is been using his compression pumps twice a day. On the right lateral leg he still has a sizable wound but this is a lot better than last time I saw this. 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 Has been using silver alginate under 4 layer compression 06/30/18; the only open wound he has is on the right lateral leg and this looks healthy. No debridement is required. We have been using silver alginate. He does not have an open wound on the left leg. There is apparently some drainage from the dorsal proximal third toe on the left although I see no open wound here. 07/03/18 on evaluation today patient was actually here just for a  nurse visit rapid change. However when he was here on Wednesday for his rat change due to having been healed on the left and then developing blisters we initiated the wrap again knowing that he would be back today for [place] to  reevaluate and see were at. Unfortunately he has developed some cellulitis into the proximal portion of his right lower extremity even into the region of his thigh. 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 He was placed on one as what at that point. Nonetheless he is done with that and has been tolerating it well otherwise. Doxycycline which in the past really did not seem to be effective for him. Nonetheless I think the best option may be for [place] to definitely reinitiate the antibiotics for a longer period of time. 07/07/18; since I last saw this patient a week ago he has had a difficult time. At that point he did not have an open wound on his left leg. We transitioned him into juxta light stockings. He was apparently in the clinic the next day with blisters on the left lateral and left medial lower calf. He also had weeping edema fluid. He was put back into a compression wrap. He was also in the clinic on Friday with intense erythema in his right thigh. Per the patient he was started on Bactrim however that didn't work at all in terms of relieving his pain and swelling. He has taken 3 doxycycline that he had left over from last time and that seems to of helped. He has blistering on the right thigh as well. 07/14/18; the erythema on his right thigh has gotten better with doxycycline that he is finishing. 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 Presumably this cellulitis in the thigh was not related to that although I think this is a bit concerning going forward. He still has an area on the right lateral calf the blister on the right medial calf just below the knee that was discussed above. On the left 2 small open areas left medial and left lateral. Edema control is adequate. He is using his compression pumps twice a day 07/20/18; continued improvement in the condition of both  legs especially the edema in his bilateral thighs. He tells me he is been losing weight through a combination of diet and exercise. He is using his compression pumps twice a day. So overall she made to the remaining wounds 07/27/2018; continued improvement in condition of both legs. His edema is well controlled. The area on the right lateral leg is just about closed he had one blisters show up on the medial left upper calf. We have him in 4 layer compression. He is going on a 10-day trip to [place], [place] and [place]. He will be driving. He wants to wear Unna boots because of the lessening amount of constriction. He will not use compression pumps while he is away 08/05/18 on evaluation today patient actually appears to be doing decently well all things considered in regard to his bilateral lower extremities. The worst ulcer is actually only posterior aspect of his left lower extremity with a four layer compression wrap cut into his leg a couple weeks back. He did have a trip and actually had [employer] for the trip that he is worn since he was last here. Nonetheless he feels like the [employer]  wraps actually do better for him his swelling is up a little bit but he also with his trip was not taking his Lasix on a regular set schedule like he was supposed to be. He states that obviously the reason being that he cannot drive and keep going without having to urinate too frequently which makes it difficult. He did not have his pumps with him while he was away either which I think also maybe playing a role here too. 08/13/2018; the patient only has a small open wound on the right lateral calf which is a big improvement in the last month or 2. He also has the area posteriorly just below the posterior fossa on the left which I think was a wrap injury from several weeks ago. He has no current evidence of cellulitis. He tells me he is back into his compression pumps twice a day. He also tells me  that while he was at the laundromat somebody stole a section of his extremitease stockings 08/20/2018; back in the clinic with a much improved state. He only has small areas on the right lateral mid calf which is just about healed. This was is more substantial area for quite a prolonged period of time. He has a small open area on the left anterior tibia. The area on the posterior calf just below the popliteal fossa is closed today. He is using his compression pumps twice a day 08/28/2018; patient has no open wound on the right leg. He has a smattering of open areas on the calf with some weeping lymphedema. More problematically than that it looks as though his wraps of slipped down in his usual he has very angry upper area of edema just below the right medial knee and on the right lateral calf. He has no open area on his feet. The patient is traveling to [facility] next week. I will send him in an antibiotic. We will continue to wrap the right leg. We ordered extremitease stockings for him last week and I plan to transition the right leg to a stocking when he gets home which will be in 10 days time. As usual he is very reluctant to take his pumps with him when he travels 09/07/2018; patient returns from [facility]. He shows me a picture of his left leg in the mid part of his trip last week with intense fire engine erythema. The picture look bad enough I would have considered sending him to the hospital. Instead he went to the wound care center in [facility]. They did not prescribe him antibiotics but he did take some doxycycline he had leftover from a previous visit. I had given him trimethoprim sulfamethoxazole before he left this did not work according to the patient. This is resulted in some improvement fortunately. He comes back with a large wound on the left posterior calf. Smaller area on the left anterior tibia. Denuded blisters on the dorsal left foot over his toes. Does not  have much in the way of wounds on the right leg although he does have a very tender area on the right posterior area just below the popliteal fossa also suggestive of infection. He promises me he is back on his pumps twice a day 09/15/2018; the intense cellulitis in his left lower calf is a lot better. The wound area on the posterior left calf is also so better. However he has reasonably extensive wounds on the dorsal aspect of his second and third toes and the  proximal foot just at the base of the toes. There is nothing open on the right leg 09/22/2018; the patient has excellent edema control in his legs bilaterally. He is using his external compression pumps twice a day. He has no open area on the right leg and only the areas in the left foot dorsally second and third toe area on the left side. He does not have any signs of active cellulitis. 10/06/2018; the patient has good edema control bilaterally. He has no open wound on the right leg. There is a blister in the posterior aspect of his left calf that we had to deal with today.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

## 2019-08-18 NOTE — Progress Notes (Addendum)
AMORI, COLOMB ([id number]) Visit Report for 08/18/2019 Arrival Information Details Patient Name: Date of Service: ROC, STREETT 08/18/2019 7:30 AM Medical Record [id number] Patient Account Number: [id number] Date of Birth/Sex: Treating RN: [birth year] (68 y.o. [name] Primary Care Dailey Alberson: [name] Other Clinician: Referring Muaz Shorey: Treating Elonzo Sopp/Extender:[name], Dema Severin, [name] in Treatment: 26 Visit Information History Since Last Visit Walker All ordered tests and consults were completed: No Patient Arrived: Added or deleted any medications: No Arrival Time: 07:57 Any new allergies or adverse reactions: No Accompanied By: self Had a fall or experienced change in No Transfer Assistance: None activities of daily living that may affect Patient Identification Verified: Yes risk of falls: Secondary Verification Process Completed: Yes Signs or symptoms of abuse/neglect since last No Patient Requires Transmission-Based No visito Precautions: Hospitalized since last visit: No Patient Has Alerts: Yes Implantable device outside of the clinic excluding No cellular tissue based products placed in the center since last visit: Has Dressing in Place as Prescribed: Yes Has Compression in Place as Prescribed: Yes Pain Present Now: No Electronic Signature(s) Signed: 08/18/2019 5:37:20 PM By: Kela Millin Entered By: Kela Millin on 08/18/2019 08:07:27 -------------------------------------------------------------------------------- Compression Therapy Details Patient Name: Date of Service: [name] 08/18/2019 7:30 AM Medical Record [id number] Patient Account Number: [id number] Date of Birth/Sex: Treating RN: 03/10/1951 (68 y.o. [name] Primary Care Adrin Julian: [name] Other Clinician: Referring Toshua Honsinger: Treating Woodward Klem/Extender:[name], Dema Severin, [name] in Treatment:  186 Compression Therapy Performed for Wound Wound #137 Right,Proximal,Medial Lower Leg Assessment: Performed By: Clinician Kela Millin, RN Compression Type: [name] Post Procedure Diagnosis Same as Pre-procedure Electronic Signature(s) Signed: 08/18/2019 5:58:48 PM By: Deon Pilling Entered By: Deon Pilling on 08/18/2019 08:31:49 -------------------------------------------------------------------------------- Compression Therapy Details Patient Name: Date of Service: SURYA, SCHROETER 08/18/2019 7:30 AM Medical Record [id number] Patient Account Number: [id number] Date of Birth/Sex: Treating RN: 05-22-1951 (68 y.o. [name] Primary Care Yaw Escoto: [name] Other Clinician: Referring Tamber Burtch: Treating Ekin Pilar/Extender:[name], Dema Severin, [name] in Treatment: 186 Compression Therapy Performed for Wound NonWound Condition Lymphedema - Left Leg Assessment: Performed By: Clinician Kela Millin, RN Compression Type: Four Layer Post Procedure Diagnosis Same as Pre-procedure Electronic Signature(s) Signed: 08/18/2019 5:58:48 PM By: Deon Pilling Entered By: Deon Pilling on 08/18/2019 08:32:17 -------------------------------------------------------------------------------- Encounter Discharge Information Details Patient Name: Date of Service: [name] 08/18/2019 7:30 AM Medical Record [id number] Patient Account Number: [id number] Date of Birth/Sex: Treating RN: 1950-11-22 (68 y.o. [name] Primary Care Mitcheal Sweetin: [name] Other Clinician: Referring Mccall Will: Treating Makynlie Rossini/Extender:[name], Dema Severin, [name] in Treatment: 186 Encounter Discharge Information Items Discharge Condition: Stable Ambulatory Status: Walker Discharge Destination: Home Transportation: Other Accompanied By: self Schedule Follow-up Appointment: Yes Clinical Summary of Care: Patient Declined Electronic  Signature(s) Signed: 08/18/2019 5:37:20 PM By: Kela Millin Entered By: Kela Millin on 08/18/2019 09:03:40 -------------------------------------------------------------------------------- Lower Extremity Assessment Details Patient Name: Date of Service: OAKLYN, MANS 08/18/2019 7:30 AM Medical Record [id number] Patient Account Number: [id number] Date of Birth/Sex: Treating RN: 03/07/1951 (68 y.o. [name] Primary Care Beyonce Sawatzky: [name] Other Clinician: Referring Ariela Mochizuki: Treating Fiore Detjen/Extender:[name], Dema Severin, [name] in Treatment: 186 Edema Assessment Assessed: [Left: No] [Right: No] Edema: [Left: Yes] [Right: Yes] Calf Left: Right: Point of Measurement: 31 cm From Medial Instep 40.9 cm 42.5 cm Ankle Left: Right: Point of Measurement: 12 cm From Medial Instep 25.7 cm 27.6 cm Electronic Signature(s) Signed: 08/18/2019 5:37:20 PM By: Kela Millin Entered By: Kela Millin on  08/18/2019 08:10:19 -------------------------------------------------------------------------------- Multi-Disciplinary Care Plan Details Patient Name: Date of Service: YOVANNI, FRENETTE 08/18/2019 7:30 AM Medical Record [id number] Patient Account Number: [id number] Date of Birth/Sex: Treating RN: [birth year] (67 y.o. [name] Primary Care Chucky Homes: [name] Other Clinician: Referring Trustin Chapa: Treating Isaah Furry/Extender:[name], Dema Severin, [name] in Treatment: 186 Active Inactive Venous Leg Ulcer Nursing Diagnoses: Actual venous Insuffiency (use after diagnosis is confirmed) Goals: Patient will maintain optimal edema control Date Initiated: 09/10/2016 Target Resolution Date: 09/03/2019 Goal Status: Active Verify adequate tissue perfusion prior to therapeutic compression application Date Initiated: 09/10/2016 Date Inactivated: 11/28/2016 Goal Status: Met Interventions: Assess peripheral edema status every  visit. Compression as ordered Provide education on venous insufficiency Notes: edema not contolled above wraps, pt not using lymoh pumps regularly Wound/Skin Impairment Nursing Diagnoses: Impaired tissue integrity Goals: Patient/caregiver will verbalize understanding of skin care regimen Date Initiated: 09/10/2016 Target Resolution Date: 09/03/2019 Goal Status: Active Interventions: Assess patient/caregiver ability to perform ulcer/skin care regimen upon admission and as needed Assess ulceration(s) every visit Provide education on ulcer and skin care Notes: Electronic Signature(s) Signed: 08/18/2019 5:58:48 PM By: Deon Pilling Entered By: Deon Pilling on 08/18/2019 08:29:09 -------------------------------------------------------------------------------- Pain Assessment Details Patient Name: Date of Service: OJAS, COONE 08/18/2019 7:30 AM Medical Record [id number] Patient Account Number: [id number] Date of Birth/Sex: Treating RN: [birth year] (68 y.o. [name] Primary Care Zandon Talton: [name] Other Clinician: Referring Zulema Pulaski: Treating Leticia Mcdiarmid/Extender:[name], Dema Severin, [name] in Treatment: 186 Active Problems Location of Pain Severity and Description of Pain Patient Has Paino No Site Locations Pain Management and Medication Current Pain Management: Electronic Signature(s) Signed: 08/18/2019 5:37:20 PM By: Kela Millin Entered By: Kela Millin on 08/18/2019 08:09:37 -------------------------------------------------------------------------------- Patient/Caregiver Education Details Patient Name: Date of Service: [name] 10/14/2020andnbsp7:30 AM Medical Record [id number] Patient Account Number: [id number] Date of Birth/Gender: Treating RN: 06-09-1951 (68 y.o. [name] Primary Care Physician: [name] Other Clinician: Referring Physician: Treating Physician/Extender:[name],  Dema Severin, [name] in Treatment: 186 Education Assessment Education Provided To: Patient Education Topics Provided Wound/Skin Impairment: Handouts: Caring for Your Ulcer Methods: Explain/Verbal Responses: Reinforcements needed Electronic Signature(s) Signed: 08/18/2019 5:58:48 PM By: Deon Pilling Entered By: Deon Pilling on 08/18/2019 08:31:14 -------------------------------------------------------------------------------- Wound Assessment Details Patient Name: Date of Service: ERLIN, GARDELLA 08/18/2019 7:30 AM Medical Record [id number] Patient Account Number: [id number] Date of Birth/Sex: Treating RN: 03/15/1951 (68 y.o. [name] Primary Care Joevanni Roddey: [name] Other Clinician: Referring Fransico Sciandra: Treating Aldine Chakraborty/Extender:[name], Dema Severin, [name] in Treatment: 186 Wound Status Wound Number: [id number] Primary Lymphedema Etiology: Wound Location: Left Upper Leg - Medial Wound Healed - Epithelialized Wounding Event: Blister Status: Date Acquired: 07/14/2019 Comorbid Chronic sinus problems/congestion, Weeks Of Treatment: 5 History: Arrhythmia, Hypertension, Peripheral Arterial Clustered Wound: No Disease, Type II Diabetes, History of Burn, Gout, Confinement Anxiety Photos Wound Measurements Length: (cm) 0 % Reduc Width: (cm) 0 % Reduc Depth: (cm) 0 Epithel Area: (cm) 0 Tunnel Volume: (cm) 0 Underm Wound Description Classification: Full Thickness Without Exposed Support Foul O Structures Slough Wound Distinct, outline attached Margin: Exudate None Present Amount: Wound Bed Granulation Amount: None Present (0%) Necrotic Amount: None Present (0%) Fascia Fat Lay Tendon Muscle Joint E Bone Ex Electronic Signature(s) Signed: 08/19/2019 6:42:53 PM By: Kela Millin Signed: 08/24/2019 1:32:19 PM By: Mikeal Hawthorne EMT/HBOT Previous Signature: 08/18/2019 5:37:20 PM Version By: Michel Harrow Entered ByMikeal Hawthorne on 10/15 dor After Cleansing: No Lynita Lombard No Exposed Structure Exposed: No er (Subcutaneous Tissue) Exposed: No  Exposed: No Exposed: No xposed: No posed: No hannon /2020 08:34:39 tion in Area: 100% tion in Volume: 100% ialization: Large (67-100%) ing: No ining: No -------------------------------------------------------------------------------- Wound Assessment Details Patient Name: Date of Service: TANISH, PRIEN 08/18/2019 7:30 AM Medical Record [id number] Patient Account Number: [id number] Date of Birth/Sex: Treating RN: 1950-12-13 (68 y.o. [name] Primary Care Gerlene Glassburn: [name] Other Clinician: Referring Lory Galan: Treating Ethen Bannan/Extender:[name], Dema Severin, [name] in Treatment: 186 Wound Status Wound Number: [id number] Primary Lymphedema Etiology: Wound Location: Right Lower Leg - Medial, Proximal Wound Open Status: Wounding Event: Blister Comorbid Chronic sinus problems/congestion, Date Acquired: 08/01/2019 History: Arrhythmia, Hypertension, Peripheral Arterial Weeks Of Treatment: 2 Disease, Type II Diabetes, History of Burn, Clustered Wound: No Gout, Confinement Anxiety Photos Wound Measurements Length: (cm) 0.5 % Reduc Width: (cm) 0.5 % Reduc Depth: (cm) 0.1 Epithel Area: (cm) 0.196 Tunnel Volume: (cm) 0.02 Underm Wound Description Classification: Full Thickness Without Exposed Support Foul O Structures Slough Exudate Medium Amount: Exudate Serosanguineous Type: Exudate red, brown Color: Wound Bed Granulation Amount: Small (1-33%) Granulation Quality: Pink Fascia Exp Necrotic Amount: Large (67-100%) Fat Layer Necrotic Quality: Adherent Slough Tendon Exp Muscle Exp Joint Expo Bone Expos dor After Cleansing: No /Fibrino Yes Exposed Structure osed: No (Subcutaneous Tissue) Exposed: Yes osed: No osed: No sed: No ed: No tion in Area: 99.8% tion in Volume: 99.8% ialization: None ing:  No ining: No Electronic Signature(s) Signed: 08/19/2019 6:42:53 PM By: Kela Millin Signed: 08/24/2019 1:32:19 PM By: Mikeal Hawthorne EMT/HBOT Previous Signature: 08/18/2019 5:37:20 PM Version By: Kela Millin Entered By: Mikeal Hawthorne on 08/19/2019 08:30:59 -------------------------------------------------------------------------------- Wound Assessment Details Patient Name: Date of Service: [name] 08/18/2019 7:30 AM Medical Record [id number] Patient Account Number: [id number] Date of Birth/Sex: Treating RN: [birth year] (68 y.o. [name] Primary Care Krystle Polcyn: [name] Other Clinician: Referring Cornie Mccomber: Treating Daziah Hesler/Extender:[name], Dema Severin, [name] in Treatment: 186 Wound Status Wound Number: 138 Primary Diabetic Wound/Ulcer of the Lower Extremity Etiology: Wound Location: Right Toe Second - Anterior Wound Open Wounding Event: Gradually Appeared Status: Date Acquired: 08/08/2019 Comorbid Chronic sinus problems/congestion, Weeks Of Treatment: 1 History: Arrhythmia, Hypertension, Peripheral Arterial Clustered Wound: Yes Disease, Type II Diabetes, History of Burn, Gout, Confinement Anxiety Photos Wound Measurements Length: (cm) 4.8 Width: (cm) 5.5 Depth: (cm) 0.1 Area: (cm) 20.735 Volume: (cm) 2.073 Wound Description Classification: Grade 2 Exudate Amount: Small Exudate Type: Serosanguineous Exudate Color: red, brown Wound Bed Granulation Amount: Medium (34-66%) Granulation Quality: Pink Necrotic Amount: Medium (34-66%) Necrotic Quality: Adherent Slough After Cleansing: No rino Yes Exposed Structure osed: No (Subcutaneous Tissue) Exposed: Yes osed: No osed: No sed: No ed: No % Reduction in Area: -2299.9% % Reduction in Volume: -2310.5% Epithelialization: None Tunneling: No Undermining: No Foul Odor Slough/Fib Fascia Exp Fat Layer Tendon Exp Muscle Exp Joint Expo Bone Expos Electronic  Signature(s) Signed: 08/19/2019 6:42:53 PM By: Kela Millin Signed: 08/24/2019 1:32:19 PM By: Mikeal Hawthorne EMT/HBOT Previous Signature: 08/18/2019 5:37:20 PM Version By: Kela Millin Entered By: Mikeal Hawthorne on 08/19/2019 08:30:15 -------------------------------------------------------------------------------- Wound Assessment Details Patient Name: Date of Service: [name] 08/18/2019 7:30 AM Medical Record [id number] Patient Account Number: [id number] Date of Birth/Sex: Treating RN: December 03, 1950 (68 y.o. [name] Primary Care Jantz Main: [name] Other Clinician: Referring Nigel Wessman: Treating Zavon Hyson/Extender:[name], Dema Severin, [name] in Treatment: 186 Wound Status Wound Number: 139 Primary Lymphedema Etiology: Wound Location: Left Upper Leg - Anterior Wound Open Wounding Event: Blister Status: Date Acquired: 08/13/2019 Comorbid Chronic sinus problems/congestion, Weeks  Of Treatment: 0 History: Arrhythmia, Hypertension, Peripheral Arterial Clustered Wound: No Disease, Type II Diabetes, History of Burn, Gout, Confinement Anxiety Photos Wound Measurements Length: (cm) 0.5 % Reduc Width: (cm) 0.5 % Reduc Depth: (cm) 0.1 Epithel Area: (cm) 0.196 Tunnel Volume: (cm) 0.02 Underm Wound Description Classification: Full Thickness Without Exposed Support Foul O Structures Slough Exudate Small Amount: Exudate Serosanguineous Type: Exudate red, brown Color: Wound Bed Granulation Amount: Large (67-100%) Granulation Quality: Red Fascia Necrotic Amount: None Present (0%) Fat Lay Tendon Muscle Joint E Bone Ex dor After Cleansing: No /Fibrino No Exposed Structure Exposed: No er (Subcutaneous Tissue) Exposed: Yes Exposed: No Exposed: No xposed: No posed: No tion in Area: 0% tion in Volume: 0% ialization: None ing: No ining: No Electronic Signature(s) Signed: 08/19/2019 6:42:53 PM By: Kela Millin Signed:  08/24/2019 1:32:19 PM By: Mikeal Hawthorne EMT/HBOT Previous Signature: 08/18/2019 5:37:20 PM Version By: Kela Millin Entered By: Mikeal Hawthorne on 08/19/2019 08:35:48 -------------------------------------------------------------------------------- Vitals Details Patient Name: Date of Service: [name]. 08/18/2019 7:30 AM Medical Record [id number] Patient Account Number: [id number] Date of Birth/Sex: Treating RN: 20-Oct-1951 (68 y.o. [name] Primary Care Izacc Demeyer: [name] Other Clinician: Referring Evaluna Utke: Treating Allie Gerhold/Extender:[name], Dema Severin, [name] in Treatment: 186 Vital Signs Time Taken: 08:07 Temperature (F): 97.8 Height (in): 70 Pulse (bpm): 53 Weight (lbs): 380.2 Respiratory Rate (breaths/min): 18 Body Mass Index (BMI): 54.5 Blood Pressure (mmHg): 159/52 Reference Range: 80 - 120 mg / dl Electronic Signature(s) Signed: 08/18/2019 5:37:20 PM By: Kela Millin Entered By: Kela Millin on 08/18/2019 99:77:41

## 2019-08-19 ENCOUNTER — Other Ambulatory Visit: Payer: Self-pay

## 2019-08-19 ENCOUNTER — Encounter (HOSPITAL_BASED_OUTPATIENT_CLINIC_OR_DEPARTMENT_OTHER): Payer: Medicare Other | Admitting: Internal Medicine

## 2019-08-19 DIAGNOSIS — L97122 Non-pressure chronic ulcer of left thigh with fat layer exposed: Secondary | ICD-10-CM | POA: Diagnosis not present

## 2019-08-19 DIAGNOSIS — I87313 Chronic venous hypertension (idiopathic) with ulcer of bilateral lower extremity: Secondary | ICD-10-CM | POA: Diagnosis not present

## 2019-08-19 DIAGNOSIS — L97129 Non-pressure chronic ulcer of left thigh with unspecified severity: Secondary | ICD-10-CM | POA: Diagnosis not present

## 2019-08-19 DIAGNOSIS — I87333 Chronic venous hypertension (idiopathic) 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: Secondary | ICD-10-CM | POA: Diagnosis not present

## 2019-08-19 DIAGNOSIS — E11622 Type 2 diabetes mellitus with other skin ulcer: Secondary | ICD-10-CM | POA: Diagnosis not present

## 2019-08-19 DIAGNOSIS — E1140 Type 2 diabetes mellitus with diabetic neuropathy, unspecified: Secondary | ICD-10-CM | POA: Diagnosis not present

## 2019-08-19 DIAGNOSIS — L97812 Non-pressure chronic ulcer of other part of right lower leg with fat layer exposed: Secondary | ICD-10-CM | POA: Diagnosis not present

## 2019-08-19 DIAGNOSIS — I890 Lymphedema, not elsewhere classified: Secondary | ICD-10-CM | POA: Diagnosis not present

## 2019-08-19 DIAGNOSIS — L97929 Non-pressure chronic ulcer of unspecified part of left lower leg with unspecified severity: Secondary | ICD-10-CM | POA: Diagnosis not present

## 2019-08-19 DIAGNOSIS — L97512 Non-pressure chronic ulcer of other part of right foot with fat layer exposed: Secondary | ICD-10-CM | POA: Diagnosis not present

## 2019-08-23 ENCOUNTER — Encounter (HOSPITAL_BASED_OUTPATIENT_CLINIC_OR_DEPARTMENT_OTHER): Payer: Medicare Other | Admitting: Internal Medicine

## 2019-08-23 ENCOUNTER — Other Ambulatory Visit: Payer: Self-pay

## 2019-08-23 DIAGNOSIS — L97129 Non-pressure chronic ulcer of left thigh with unspecified severity: Secondary | ICD-10-CM | POA: Diagnosis not present

## 2019-08-23 DIAGNOSIS — E11622 Type 2 diabetes mellitus with other skin ulcer: Secondary | ICD-10-CM | POA: Diagnosis not present

## 2019-08-23 DIAGNOSIS — L97122 Non-pressure chronic ulcer of left thigh with fat layer exposed: Secondary | ICD-10-CM | POA: Diagnosis not present

## 2019-08-23 DIAGNOSIS — I87333 Chronic venous hypertension (idiopathic) 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: Secondary | ICD-10-CM | POA: Diagnosis not present

## 2019-08-23 DIAGNOSIS — E1140 Type 2 diabetes mellitus with diabetic neuropathy, unspecified: Secondary | ICD-10-CM | POA: Diagnosis not present

## 2019-08-23 DIAGNOSIS — L97812 Non-pressure chronic ulcer of other part of right lower leg with fat layer exposed: Secondary | ICD-10-CM | POA: Diagnosis not present

## 2019-08-23 DIAGNOSIS — L97929 Non-pressure chronic ulcer of unspecified part of left lower leg with unspecified severity: Secondary | ICD-10-CM | POA: Diagnosis not present

## 2019-08-23 DIAGNOSIS — I890 Lymphedema, not elsewhere classified: Secondary | ICD-10-CM | POA: Diagnosis not present

## 2019-08-23 DIAGNOSIS — L97512 Non-pressure chronic ulcer of other part of right foot with fat layer exposed: Secondary | ICD-10-CM | POA: Diagnosis not present

## 2019-08-27 ENCOUNTER — Other Ambulatory Visit: Payer: Self-pay

## 2019-08-27 ENCOUNTER — Encounter (HOSPITAL_BASED_OUTPATIENT_CLINIC_OR_DEPARTMENT_OTHER): Payer: Medicare Other | Admitting: Internal Medicine

## 2019-08-27 DIAGNOSIS — E1140 Type 2 diabetes mellitus with diabetic neuropathy, unspecified: Secondary | ICD-10-CM | POA: Diagnosis not present

## 2019-08-27 DIAGNOSIS — E11622 Type 2 diabetes mellitus with other skin ulcer: Secondary | ICD-10-CM | POA: Diagnosis not present

## 2019-08-27 DIAGNOSIS — L97812 Non-pressure chronic ulcer of other part of right lower leg with fat layer exposed: Secondary | ICD-10-CM | POA: Diagnosis not present

## 2019-08-27 DIAGNOSIS — L97122 Non-pressure chronic ulcer of left thigh with fat layer exposed: Secondary | ICD-10-CM | POA: Diagnosis not present

## 2019-08-27 DIAGNOSIS — I890 Lymphedema, not elsewhere classified: Secondary | ICD-10-CM | POA: Diagnosis not present

## 2019-08-27 DIAGNOSIS — L97512 Non-pressure chronic ulcer of other part of right foot with fat layer exposed: Secondary | ICD-10-CM | POA: Diagnosis not present

## 2019-08-27 DIAGNOSIS — L97129 Non-pressure chronic ulcer of left thigh with unspecified severity: Secondary | ICD-10-CM | POA: Diagnosis not present

## 2019-08-27 DIAGNOSIS — L97519 Non-pressure chronic ulcer of other part of right foot with unspecified severity: Secondary | ICD-10-CM | POA: Diagnosis not present

## 2019-08-27 DIAGNOSIS — I87333 Chronic venous hypertension (idiopathic) 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: Secondary | ICD-10-CM | POA: Diagnosis not present

## 2019-08-27 DIAGNOSIS — L97819 Non-pressure chronic ulcer of other part of right lower leg with unspecified severity: Secondary | ICD-10-CM | POA: Diagnosis not present

## 2019-08-27 DIAGNOSIS — L97929 Non-pressure chronic ulcer of unspecified part of left lower leg with unspecified severity: Secondary | ICD-10-CM | POA: Diagnosis not present

## 2019-08-27 NOTE — Progress Notes (Signed)
[name], [name] ([id number]) Visit Report for 08/27/2019 HPI Details Patient Name: Date of Service: [name], [name] 08/27/2019 10:30 AM Medical Record [id number] Patient Account Number: [id number] Date of Birth/Sex: Treating RN: 03/19/1951 (68 y.o. [name] Primary Care Provider: Nicoletta [name] Other Clinician: Referring Provider: Treating Provider/Extender:[name], [name], [name] [name] in Treatment: 187 History of Present Illness HPI Description: Referred by PCP for consultation. Patient has long standing history of BLE venous stasis, no prior ulcerations. At beginning of month, developed cellulitis and weeping. Received IM Rocephin followed by Keflex and resolved. Wears compression stocking, appr 6 months old. Not sure strength. No present drainage. 01/22/16 this is a patient who is a type II diabetic on insulin. 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 He tells me he religiously wears pressure stockings of uncertain strength. He was here with weeping edema about 8 months ago but did not have an open wound. Roughly a month ago he had a reopening on his bilateral legs. He is been using bandages and Neosporin. He does not complain of pain. 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 Last BUN/creatinine I have is from 11/20/15 at 13 and 1.0 respectively 01/29/16; patient arrives today having tolerated the Profore wrap. He brought in his stockings and these are 18 mmHg stockings he bought from [place]. The compression here is likely inadequate. He does not complain of pain or excessive drainage she has no systemic symptoms. The wound on the right looks improved as does the one on the left although one on the left is more substantial with still tissue at risk below the actual wound area on the bilateral posterior calf 02/05/16;  patient arrives with poor edema control. He states that we did put a 4 layer compression on it last week. No weight appear 5 this. 02/12/16; the area on the posterior right Has healed. The left Has a substantial wound that has necrotic surface eschar that requires a debridement with a curette. 02/16/16;the patient called or a Nurse visit secondary to increased swelling. He had been in earlier in the week with his right leg healed. He was transitioned to is on pressure stocking on the right leg with the only open wound on the left, a substantial area on the left posterior calf. Note he has a history of severe lower extremity edema, he has a history of chronic atrial fibrillation but not heart failure per my notes but I'll need to research this. He is not complaining of chest pain shortness of breath or orthopnea. The intake nurse noted blisters on the previously closed right leg 02/19/16; this is the patient's regular visit day. I see him on Friday with escalating edema new wounds on the right leg and clear signs of at least right ventricular heart failure. I increased his Lasix to 40 twice a day. He is returning currently in follow-up. States he is noticed a decrease in that the edema 02/26/16 patient's legs have much less edema. There is nothing really open on the right leg. The left leg has improved condition of the large superficial wound on the posterior left leg 03/04/16; edema control is very much better. The patient's right leg wounds have healed. On the left leg he continues to have severe venous inflammation on the posterior aspect of the left leg. There is no tenderness and I don't think any of this is cellulitis.  03/11/16; patient's right leg is married healed and he is in his own stocking. The patient's left leg has deteriorated somewhat. There is a lot of erythema around the wound on the posterior left leg. There is also a significant rim of erythema posteriorly just above where the wrap  would've ended there is a new wound in this location and a lot of tenderness. Can't rule out cellulitis in this area. 03/15/16; patient's right leg remains healed and he is in his own stocking. The patient's left leg is much better than last review. His major wound on the posterior aspect of his left Is almost fully epithelialized. He has 3 small injuries from the wraps. Really. Erythema seems a lot better on antibiotics 03/18/16; right leg remains healed and he is in his own stocking. The patient's left leg is much better. The area on the posterior aspect of the left calf is fully epithelialized. His 3 small injuries which were wrap injuries on the left are improved only one seems still open his erythema has resolved 03/25/16; patient's right leg remains healed and he is in his own stocking. There is no open area today on the left leg posterior leg is completely closed up. His wrap injuries at the superior aspect of his leg are also resolved. He looks as though he has some irritation on the dorsal ankle but this is fully epithelialized without evidence of infection. 03/28/16; we discharged this patient on Monday. Transitioned him into his own stocking. There were problems almost immediately with uncontrolled swelling weeping edema multiple some of which have opened. He does not feel systemically unwell in particular no chest pain no shortness of breath and he does not feel 04/08/16; the edema is under better control with the Profore light wrap but he still has pitting edema. There is one large wound anteriorly 2 on the medial aspect of his left leg and 3 small areas on the superior posterior calf. Drainage is not excessive he is tolerating a Profore light well 04/15/16; put a Profore wrap on him last week. This is controlled is edema however he had a lot of pain on his left anterior foot most of his wounds are healed 04/22/16 once again the patient has denuded areas on the left anterior foot which he  states are because his wrap slips up word. He saw his primary physician today is on Lasix 40 twice a day and states that he his weight is down 20 pounds over the last 3 months. 04/29/16: Much improved. left anterior foot much improved. He is now on Lasix 80 mg per day. Much improved edema control 05/06/16; I was hoping to be able to discharge him today however once again he has blisters at a low level of where the compression was placed last week mostly on his left lateral but also his left medial leg and a small area on the anterior part of the left foot. 05/09/16; apparently the patient went home after his appointment on 7/4 later in the evening developing pain in his upper medial thigh together with subjective fever and chills although his temperature was not taken. The pain was so intense he felt he would probably have to call 911. However he then remembered that he had leftover doxycycline from a previous round of antibiotics and took these. By the next morning he felt a lot better. He called and spoke to one of our nurses and I approved doxycycline over the phone thinking that this was in relation to the  wounds we had previously seen although they were definitely were not. The patient feels a lot better old fever no chills he is still working. Blood sugars are reasonably controlled 05/13/16; patient is back in for review of his cellulitis on his anterior medial upper thigh. He is taking doxycycline this is a lot better. Culture I did of the nodular area on the dorsal aspect of his foot grew MRSA this also looks a lot better. 05/20/16; the patient is cellulitis on the medial upper thigh has resolved. All of his wound areas including the left anterior foot, areas on the medial aspect of the left calf and the lateral aspect of the calf at all resolved. He has a new blister on the left dorsal foot at the level of the fourth toe this was excised. No evidence of infection 05/27/16; patient continues to  complain weeping edema. He has new blisterlike wounds on the left anterior lateral and posterior lateral calf at the top of his wrap levels. The area on his left anterior foot appears better. He is not complaining of fever, pain or pruritus in his feet. 05/30/16; the patient's blisters on his left anterior leg posterior calf all look improved. He did not increase the Lasix 100 mg as I suggested because he was going to run out of his 40 mg tablets. He is still having weeping edema of his toes 06/03/16; I renewed his Lasix at 80 mg once a day as he was about to run out when I last saw him. He is on 80 mg of Lasix now. I have asked him to cut down on the excessive amount of water he was drinking and asked him to drink according to his thirst mechanisms 06/12/2016 -- was seen 2 days ago and was supposed to wear his compression stockings at home but he is developed lymphedema and superficial blisters on the left lower extremity and hence came in for a review 06/24/16; the remaining wound is on his left anterior leg. He still has edema coming from between his toes. There is lymphedema here however his edema is generally better than when I last saw this. He has a history of atrial fibrillation but does not have a known history of congestive heart failure nevertheless I think he probably has this at least on a diastolic basis. 07/01/16 I reviewed his echocardiogram from January 2017. This was essentially normal. He did not have LVH, EF of 55-60%. His right ventricular function was normal although he did have trivial tricuspid and pulmonic regurgitation. This is not audible on exam however. I increased his Lasix to do massive edema in his legs well above his knees I think in early July. He was also drinking an excessive amount of water at the time. 07/15/16; missed his appointment last week because of the Labor Day holiday on Monday. He could not get another appointment later in the week. Started to feel the  wrap digging in superiorly so we remove the top half and the bottom half of his wrap. He has extensive erythema and blistering superiorly in the left leg. Very tender. Very swollen. Edema in his foot with leaking edema fluid. He has not been systemically unwell 07/22/16; the area on the left leg laterally required some debridement. The medial wounds look more stable. His wrap injury wounds appear to have healed. Edema and his foot is better, weeping edema is also better. He tells me he is meeting with the supplier of the external compression pumps at work 08/05/16;  the patient was on vacation last week in [facility]. His wrap is been on for an extended period of time. Also over the weekend he developed an extensive area of tender erythema across his anterior medial thigh. He took to doxycycline yesterday that he had leftover from a previous prescription. The patient complains of weeping edema coming out of his toes 08/08/16; I saw this patient on 10/2. He was tender across his anterior thigh. I put him on doxycycline. He returns today in follow-up. He does not have any open wounds on his lower leg, he still has edema weeping into his toes. 08/12/16; patient was seen back urgently today to follow-up for his extensive left thigh cellulitis/erysipelas. He comes back with a lot less swelling and erythema pain is much better. I believe I gave him Augmentin and Cipro. His wrap was cut down as he stated a roll down his legs. He developed blistering above the level of the wrap that remained. He has 2 open blisters and 1 intact. 08/19/16; patient is been doing his primary doctor who is increased his Lasix from 40-80 once a day or 80 already has less edema. Cellulitis has remained improved in the left thigh. 2 open areas on the posterior left calf 08/26/16; he returns today having new open blisters on the anterior part of his left leg. He has his compression pumps but is not yet been shown how to use some  vital representative from the supplier. 09/02/16 patient returns today with no open wounds on the left leg. Some maceration in his plantar toes 09/10/2016 -- Dr. Leanord Hawking had recently discharged him on 09/02/2016 and he has come right back with redness swelling and some open ulcers on his left lower extremity. He says this was caused by trying to apply his compression stockings and he's been unable to use this and has not been able to use his lymphedema pumps. He had some doxycycline leftover and he has started on this a few days ago. 09/16/16; there are no open wounds on his leg on the left and no evidence of cellulitis. 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 He does not complain of symptoms here. I am not clear use using his external compression pumps. 09/23/16; I have not seen this patient in 2 [name]. He canceled his appointment 10 days ago as he was going on vacation. He tells me that on Monday he noticed a large area on his posterior left leg which is been draining copiously and is reopened into a large wound. He is been using ABDs and the external part of his juxtalite, according to our nurse this was not on properly. 10/07/16; Still a substantial area on the posterior left leg. Using silver alginate 10/14/16; in general better although there is still open area which looks healthy. Still using silver alginate. He reminds me that this happen before he left for [facility]. Today while he was showering in the morning. He had been using his juxtalite's 10/21/16; the area on his posterior left leg is fully epithelialized. However he arrives today with a large area of tender erythema in his medial and posterior left thigh just above the knee. I have marked the area. Once again he is reluctant to consider hospitalization. I treated him with oral antibiotics in the past for a similar situation with resolution I think with doxycycline  however this area it seems more extensive to me. He is not complaining of fever  but does have chills and says states he is thirsty. His blood sugar today was in the 140s at home 10/25/16 the area on his posterior left leg is fully epithelialized although there is still some weeping edema. The large area of tenderness and erythema in his medial and posterior left thigh is a lot less tender although there is still a lot of swelling in this thigh. He states he feels a lot better. He is on doxycycline and Augmentin that I started last week. This will continued until Tuesday, December 26. I have ordered a duplex ultrasound of the left thigh rule out DVT whether there is an abscess something that would need to be drained I would also like to know. 11/01/16; he still has weeping edema from a not fully epithelialized area on his left posterior calf. Most of the rest of this looks a lot better. He has completed his antibiotics. His thigh is a lot better. Duplex ultrasound did not show a DVT in the thigh 11/08/16; he comes in today with more Denuded surface epithelium from the posterior aspect of his calf. There is no real evidence of cellulitis. The superior aspect of his wrap appears to have put quite an indentation in his leg just below the knee and this may have contributed. He does not complain of pain or fever. We have been using silver alginate as the primary dressing. The area of cellulitis in the right thigh has totally resolved. He has been using his compression stockings once a week 11/15/16; the patient arrives today with more loss of epithelium from the posterior aspect of his left calf. He now has a fairly substantial wound in this area. The reason behind this deterioration isn't exactly clear although his edema is not well controlled. He states he feels he is generally more swollen systemically. He is not complaining of chest pain shortness of breath fever. Tells me he has an appointment with his  primary physician in early February. He is on 80 mg of oral Lasix a day. He claims compliance with the external compression pumps. He is not having any pain in his legs similar to what he has with his recurrent cellulitis 11/22/16; the patient arrives a follow-up of his large area on his left lateral calf. This looks somewhat better today. He came in earlier in the week for a dressing change since I saw him a week ago. He is not complaining of any pain no shortness of breath no chest pain 11/28/16; the patient arrives for follow-up of his large area on the left lateral calf this does not look better. In fact it is larger weeping edema. The surface of the wound does not look too bad. We have been using silver alginate although I'm not certain that this is a dressing issue. 12/05/16; again the patient follows up for a large wound on the left lateral and left posterior calf this does not look better. There continues to be weeping edema necrotic surface tissue. More worrisome than this once again there is erythema below the wound involving the distal Achilles and heel suggestive of cellulitis. He is on his feet working most of the day of this is not going well. We are changing his dressing twice a week to facilitate the drainage. 12/12/16; not much change in the overall dimensions of the large area on the left posterior calf. This is very inflamed looking. I gave him an. Doxycycline last week does not really seem to have helped. He found the wrap  very painful indeed it seems to of dog into his legs superiorly and perhaps around the heel. He came in early today because the drainage had soaked through his dressings. 12/19/16- patient arrives for follow-up evaluation of his left lower extremity ulcers. He states that he is using his lymphedema pumps once daily when there is "no drainage". He admits to not using his lipedema pumps while under current treatment. His blood sugars have been consistently between  150-200. 12/26/16; the patient is not using his compression pumps at home because of the wetness on his feet. I've advised him that I think it's important for him to use this daily. He finds his feet too wet, he can put a plastic bag over his legs while he is in the pumps. Otherwise I think will be in a vicious circle. We are using silver alginate to the major area on his left posterior calf 01/02/17; the patient's posterior left leg has further of all into 3 open wounds. All of them covered with a necrotic surface. He claims to be using his compression pumps once a day. His edema control is marginal. Continue with silver alginate 01/10/17; the patient's left posterior leg actually looks somewhat better. There is less edema, less erythema. Still has 3 open areas covered with a necrotic surface requiring debridement. He claims to be using his compression pumps once a day his edema control is better 01/17/17; the patient's left posterior calf look better last week when I saw him and his wrap was changed 2 days ago. He has noted increasing pain in the left heel and arrives today with much larger wounds extensive erythema extending down into the entire heel area especially tender medially. He is not systemically unwell CBGs have been controlled no fever. Our intake nurse showed me limegreen drainage on his AVD pads. 01/24/17; his usual this patient responds nicely to antibiotics last week giving him Levaquin for presumed Pseudomonas. The whole entire posterior part of his leg is much better much less inflamed and in the case of his Achilles heel area much less tender. He has also had some epithelialization posteriorly there are still open areas here and still draining but overall considerably better 01/31/17- He has continue to tolerate the compression wraps. he states that he continues to use the lymphedema pumps daily, and can increase to twice daily on the weekends. He is voicing no complaints or concerns  regarding his LLE ulcers 02/07/17-he is here for follow-up evaluation. He states that he noted some erythema to the left medial and anterior thigh, which he states is new as of yesterday. He is concerned about recurrent cellulitis. He states his blood sugars have been slightly elevated, this morning in the 180s 02/14/17; he is here for follow-up evaluation. When he was last here there was erythema superiorly from his posterior wound in his anterior thigh. He was prescribed Levaquin however a culture of the wound surface grew MRSA over the phone I changed him to doxycycline on Monday and things seem to be a lot better. 02/24/17; patient missed his appointment on Friday therefore we changed his nurse visit into a physician visit today. Still using silver alginate on the large area of the posterior left thigh. He isn't new area on the dorsal left second toe 03/03/17; actually better today although he admits he has not used his external compression pumps in the last 2 days or so because of work responsibilities over the weekend. 03/10/17; continued improvement. External compression pumps once a day almost  all of his wounds have closed on the posterior left calf. Better edema control 03/17/17; in general improved. He still has 3 small open areas on the lateral aspect of his left leg however most of the area on the posterior part of his leg is epithelialized. He has better edema control. He has an ABD pad under his stocking on the right anterior lower leg although he did not let us look at that today. 03/24/17; patient arrives back in clinic today with no open areas however there are areas on the posterior left calf and anterior left calf that are less than 100% epithelialized. His edema is well controlled in the left lower leg. There is some pitting edema probably lymphedema in the left upper thigh. He uses compression pumps at home once per day. I tried to get him to do this twice a day although he is very  reticent. 04/01/2017 -- for the last 2 days he's had significant redness, tenderness and weeping and came in for an urgent visit today. 04/07/17; patient still has 6 more days of doxycycline. He was seen by Dr. Con Memos last Wednesday for cellulitis involving the posterior aspect, lateral aspect of his Involving his heel. For the most part he is better there is less erythema and less weeping. He has been on his feet for 12 hours 2 over the weekend. Using his compression pumps once a day 04/14/17 arrives today with continued improvement. Only one area on the posterior left calf that is not fully epithelialized. He has intense bilateral venous inflammation associated with his chronic venous insufficiency disease and secondary lymphedema. We have been using silver alginate to the left posterior calf wound In passing he tells [place] today that the right leg but we have not seen in quite some time has an open area on it but he doesn't want [place] to look at this today states he will show this to [place] next week. 04/21/17; there is no open area on his left leg although he still reports some weeping edema. He showed [place] his right leg today which is the first time we've seen this leg in a long time. He has a large area of open wound on the right leg anteriorly healthy granulation. Quite a bit of swelling in the right leg and some degree of venous inflammation. He told us about the right leg in passing last week but states that deterioration in the right leg really only happened over the weekend 04/28/17; there is no open area on the left leg although there is an irritated part on the posterior which is like a wrap injury. The wound on the right leg which was new from last week at least to [place] is a lot better. 05/05/17; still no open area on the left leg. Patient is using his new compression stocking which seems to be doing a good job of controlling the edema. He states he is using his compression pumps once per day. The  right leg still has an open wound although it is better in terms of surface area. Required debridement. A lot of pain in the posterior right Achilles marked tenderness. Usually this type of presentation this patient gives concern for an active cellulitis 05/12/17; patient arrives today with his major wound from last week on the right lateral leg somewhat better. Still requiring debridement. He was using his compression stocking on the left leg however that is reopened with superficial wounds anteriorly he did not have an open wound on this leg  previously. He is still using his juxta light's once daily at night. He cannot find the time to do this in the morning as he has to be at work by 7 AM 05/19/17; right lateral leg wound looks improved. No debridement required. The concerning area is on the left posterior leg which appears to almost have a subcutaneous hemorrhagic component to it. We've been using silver alginate to all the wounds 05/26/17; the right lateral leg wound continues to look improved. However the area on the left posterior calf is a tightly adherent surface. [place] using silver alginate. Because of the weeping edema in his legs there is very little good alternatives. 06/02/17; the patient left here last week looking quite good. Major wound on the left posterior calf and a small one on the right lateral calf. Both of these look satisfactory. He tells me that by Wednesday he had noted increased pain in the left leg and drainage. He called on Thursday and Friday to get an appointment here but we were blocked. He did not go to urgent care or his primary physician. He thinks he had a fever on Thursday but did not actually take his temperature. He has not been using his compression pumps on the left leg because of pain. I advised him to go to the emergency room today for IV antibiotics for stents of left leg cellulitis but he has refused I have asked him to take 2 days off work to keep his  leg elevated and he has refused this as well. In view of this I'm going to call him and Augmentin and doxycycline. 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 However after a detailed discussion regarding the risk benefits and alternatives he refuses to get admitted to the hospital. With no other recourse I will continue him on oral antibiotics as before and hopefully he'll have his infectious disease consultation this week. 06/16/2017 -- the patient was seen today by the nurse practitioner at infectious disease Ms. Dixon. 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 He has also been advise Lotrimin cream locally. she also had wise IV antibiotics for his condition if it flares up 06/23/17; patient arrives today with drainage bilaterally although the remaining wound on the left posterior calf after cleaning up today "highlighter yellow drainage" did not look too bad. Unfortunately he has had breakdown on the right anterior leg [previously this leg had not been open and he is using a black stocking] he went to see infectious disease and is been put on clindamycin 150 mg daily, I did not verify the dose although I'm not familiar with using clindamycin in this dosing range, perhaps for prophylaxisoo 06/27/17; I brought this patient back today to follow-up on the wound deterioration on the right lower leg together with surrounding cellulitis. I started him on doxycycline 4 days ago. This area looks better however he comes in today with intense cellulitis on the medial part of his left  thigh. This is not have a wound in this area. Extremely tender. We've been using silver alginate to  the wounds on the right lower leg left lower leg with bilateral 4 layer compression he is using his external compression pumps once a day 07/04/17; patient's left medial thigh cellulitis looks better. He has not been using his compression pumps as his insert said it was contraindicated with cellulitis. His right leg continues to make improvements all the wounds are still open. We only have one remaining wound on the left posterior calf. Using silver alginate to all open areas. He is on doxycycline which I started a week ago and should be finishing I gave him Augmentin after Thursday's visit for the severe cellulitis on the left medial thigh which fortunately looks better 07/14/17; the patient's left medial thigh cellulitis has resolved. The cellulitis in his right lower calf on the right also looks better. All of his wounds are stable to improved we've been using silver alginate he has completed the antibiotics I have given him. He has clindamycin 150 mg once a day prescribed by infectious disease for prophylaxis, I've advised him to start this now. We have been using bilateral Unna boots over silver alginate to the wound areas 07/21/17; the patient is been to see infectious disease who noted his recurrent problems with cellulitis. He was not able to tolerate prophylactic clindamycin therefore he is on amoxicillin 500 twice a day. He also had a second daily dose of Lasix added By Dr. Oneta Rack but he is not taking this. Nor is he being completely compliant with his compression pumps a especially not this week. He has 2 remaining wounds one on the right posterior lateral lower leg and one on the left posterior medial lower leg. 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 The patient has Unna boots bilaterally. Still wounds on his  right lateral, left medial, and a new open area on the left anterior lateral lower leg 08/04/17; he remains on amoxicillin twice a day for prophylaxis of recurrent cellulitis. He has bilateral Unna boots for compression and silver alginate to his wounds. Arrives today with his legs looking as good as I have seen him in quite some time. Not surprisingly his wounds look better as well with improvement on the right lateral leg venous insufficiency wound and also the left medial leg. He is still using the compression pumps once a day 08/11/17; both legs appear to be doing better wounds on the right lateral and left medial legs look better. Skin on the right leg quite good. He is been using silver alginate as the primary dressing. I'm going to use Anasept gel calcium alginate and maintain all the secondary dressings 08/18/17; the patient continues to actually do quite well. The area on his right lateral leg is just about closed the left medial also looks better although it is still moist in this area. His edema is well controlled we have been using Anasept gel with calcium alginate and the usual secondary dressings, 4 layer compression and once daily use of his compression pumps "always been able to manage 09/01/17; the patient continues to do reasonably well in spite of his trip to [place]. The area on the right lateral leg is epithelialized. Left is much better but still open. He has more edema and more chronic erythema on the left leg [venous inflammation] 09/08/17; he arrives today with no open wound on the right lateral leg and decently controlled edema. Unfortunately his left leg is not nearly as in his good situation as last  week.he apparently had increasing edema starting on Saturday. He edema soaked through into his foot so used a plastic bag to walk around his home. The area on the medial right leg which was his open area is about the same however he has lost surface epithelium on the  left lateral which is new and he has significant pain in the Achilles area of the left foot. He is already on amoxicillin chronically for prophylaxis of cellulitis in the left leg 09/15/17; he is completed a week of doxycycline and the cellulitis in the left posterior leg and Achilles area is as usual improved. He still has a lot of edema and fluid soaking through his dressings. There is no open wound on the right leg. He saw infectious disease NP today 09/22/17;As usual 1 we transition him from our compression wraps to his stockings things did not go well. He has several small open areas on the right leg. He states this was caused by the compression wrap on his skin although he did not wear this with the stockings over them. He has several superficial areas on the left leg medially laterally posteriorly. He does not have any evidence of active cellulitis especially involving the left Achilles The patient is traveling [facility] Saturday going to [facility]. He states he isn't attempting to get an appointment with a heel objects wound center there to change his dressings. I am not completely certain whether this will work 10/06/17; the patient came in on Friday for a nurse visit and the nurse reported that his legs actually look quite good. He arrives in clinic today for his regular follow-up visit. He has a new wound on his left third toe over the PIP probably caused by friction with his footwear. He has small areas on the left leg and a very superficial but epithelialized area on the right anterior lateral lower leg. Other than that his legs look as good as I've seen him in quite some time. We have been using silver alginate Review of systems; no chest pain no shortness of breath other than this a 10 point review of systems negative 10/20/17; seen by Dr. Meyer Russel last week. He had taken some antibiotics [doxycycline] that he had left over. Dr. Meyer Russel thought he had candida infection and  declined to give him further antibiotics. He has a small wound remaining on the right lateral leg several areas on the left leg including a larger area on the left posterior several left medial and anterior and a small wound on the left lateral. The area on the left dorsal third toe looks a lot better. ROS; Gen.; no fever, respiratory no cough no sputum Cardiac no chest pain other than this 10 point review of system is negative 10/30/17; patient arrives today having fallen in the bathtub 3 days ago. It took him a while to get up. He has pain and maceration in the wounds on his left leg which have deteriorated. He has not been using his pumps he also has some maceration on the right lateral leg. 11/03/17; patient continues to have weeping edema especially in the left leg. This saturates his dressings which were just put on on 12/27. As usual the doxycycline seems to take care of the cellulitis on his lower leg. He is not complaining of fever, chills, or other systemic symptoms. He states his leg feels a lot better on the doxycycline I gave him empirically. He also apparently gets injections at his primary doctor's officeo Rocephin for  cellulitis prophylaxis. I didn't ask him about his compression pump compliance today I think that's probably marginal. Arrives in the clinic with all of his dressings primary and secondary macerated full of fluid and he has bilateral edema 11/10/17; the patient's right leg looks some better although there is still a cluster of wounds on the right lateral. The left leg is inflamed with almost circumferential skin loss medially to laterally although we are still maintaining anteriorly. He does not have overt cellulitis there is a lot of drainage. He is not using compression pumps. We have been using silver alginate to the wound areas, there are not a lot of options here 11/17/17; the patient's right leg continues to be stable although there is still open wounds, better than  last week. The inflammation in the left leg is better. Still loss of surface layer epithelium especially posteriorly. There is no overt cellulitis in the amount of edema and his left leg is really quite good, tells me he is using his compression pumps once a day. 11/24/17; patient's right leg has a small superficial wound laterally this continues to improve. The inflammation in the left leg is still improving however we have continuous surface layer epithelial loss posteriorly. There is no overt cellulitis in the amount of edema in both legs is really quite good. He states he is using his compression pumps on the left leg once a day for 5 out of 7 days 12/01/17; very small superficial areas on the right lateral leg continue to improve. Edema control in both legs is better today. He has continued loss of surface epithelialization and left posterior calf although I think this is better. We have been using silver alginate with large number of absorptive secondary dressings 4 layer on the left Unna boot on the right at his request. He tells me he is using his compression pumps once a day 12/08/17; he has no open area on the right leg is edema control is good here. On the left leg however he has marked erythema and tenderness breakdown of skin. He has what appears to be a wrap injury just distal to the popliteal fossa. 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 There was swelling erythema and breakdown in the right leg. He has much worse skin breakdown in the left leg as well multiple open areas medially and posteriorly as well as laterally. He tells me he has been using his compression pumps but tells me he feels that the  drainage out of his leg is worse when he uses a compression pumps. To be fair to him he is been saying this for a while however I don't know that I have really been listening to this. I wonder if the compression pumps are working properly 12/22/17;. Once again he arrives with severe erythema, weeping edema from the left greater than right leg. Noncompliance with compression pumps. New this visit he is complaining of pain on the lateral aspect of the right leg and the medial aspect of his right thigh. He apparently saw his cardiologist Dr. [name] who was ordered an echocardiogram area and I think this is a step in the right direction 12/25/17; started his doxycycline Monday night. There is still intense erythema  of the right leg especially in the anterior thigh although there is less tenderness. The erythema around the wound on the right lateral calf also is less tender. He still complaining of pain in the left heel. His wounds are about the same right lateral left medial left lateral. Superficial but certainly not close to closure. He denies being systemically unwell no fever chills no abdominal pain no diarrhea 12/29/17; back in follow-up of his extensive right calf and right thigh cellulitis. I added amoxicillin to cover possible doxycycline resistant strep. This seems to of done the trick he is in much less pain there is much less erythema and swelling. He has his echocardiogram at 11:00 this morning. X-ray of the left heel was also negative. 01/05/18; the patient arrived with his edema under much better control. Now that he is retired he is able to use his compression pumps daily and sometimes twice a day per the patient. He has a wound on the right leg the lateral wound looks better. Area on the left leg also looks a lot better. He has no evidence of cellulitis in his bilateral thighs I had a quick peak at his echocardiogram. He is in normal ejection fraction and normal left ventricular function.  He has moderate pulmonary hypertension moderately reduced right ventricular function. One would have to wonder about chronic sleep apnea although he says he doesn't snore. He'll review the echocardiogram with his cardiologist. 01/12/18; the patient arrives with the edema in both legs under exemplary control. He is using his compression pumps daily and sometimes twice daily. His wound on the right lateral leg is just about closed. He still has some weeping areas on the posterior left calf and lateral left calf although everything is just about closed here as well. I have spoken with Aldean Baker who is the patient's nurse practitioner and infectious disease. She was concerned that the patient had not understood that the parenteral penicillin injections he was receiving for cellulitis prophylaxis was actually benefiting him. I don't think the patient actually saw that I would tend to agree we were certainly dealing with less infections although he had a serious one last month. 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 He is healed. He'll be placed in juxtalite compression wraps and increase his lymphedema pumps to twice daily. We will follow up again next week to ensure there are no issues with the new regiment. 01/20/18-he is here for evaluation of bilateral lower extremity weeping edema. Yesterday he was placed in compression wrap to the right lower extremity and compression stocking to left lower shrubbery. He states he uses lymphedema pumps last night and again this morning and noted a blister to the left lower extremity. On exam he was noted to have drainage to the right lower extremity. He will be placed in Unna boots bilaterally and follow-up next week 01/26/18; patient was actually discharged a week ago to his own juxta light stockings only to return the next day with bilateral lower extremity weeping edema.he was placed in bilateral Unna boots. He  arrives today with pain in the back of his left leg. There is no open area on the right leg however there is a linear/wrap injury on the left leg and weeping edema on the left leg posteriorly. I spoke with infectious disease about 10 days ago. They were disappointed that the patient elected to discontinue prophylactic intramuscular penicillin shots as they felt it was particularly beneficial in reducing the frequency of  his cellulitis. I discussed this with the patient today. He does not share this view. He'll definitely need antibiotics today. Finally he is traveling to [place] and trauma leaving this Saturday and returning a week later and he does not travel with his pumps. He is going by car 01/30/18; patient was seen 4 days ago and brought back in today for review of cellulitis in the left leg posteriorly. I put him on amoxicillin this really hasn't helped as much as I might like. He is also worried because he is traveling to [facility] trauma by car. Finally we will be rewrapping him. There is no open area on the right leg over his left leg has multiple weeping areas as usual 02/09/18; The same wrap on for 10 days. He did not pick up the last doxycycline I prescribed for him. He apparently took 4 days worth he already had. There is nothing open on his right leg and the edema control is really quite good. He's had damage in the left leg medially and laterally especially probably related to the prolonged use of Unna boots 02/12/18; the patient arrived in clinic today for a nurse visit/wrap change. He complained of a lot of pain in the left posterior calf. He is taking doxycycline that I previously prescribed for him. Unfortunately even though he used his stockings and apparently used to compression pumps twice a day he has weeping edema coming out of the lateral part of his right leg. This is coming from the lower anterior lateral skin area. 02/16/18; the patient has finished his doxycycline and  will finish the amoxicillin 2 days. The area of cellulitis in the left calf posteriorly has resolved. He is no longer having any pain. He tells me he is using his compression pumps at least once a day sometimes twice. 02/23/18; the patient finished his doxycycline and Amoxil last week. On Friday he noticed a small erythematous circle about the size of a quarter on the left lower leg just above his ankle. This rapidly expanded and he now has erythema on the lateral and posterior part of the thigh. This is bright red. Also has an area on the dorsal foot just above his toes and a tender area just below the left popliteal fossa. He came off his prophylactic penicillin injections at his own insistence one or 2 months ago. This is obviously deteriorated since then 03/02/18; patient is on doxycycline and Amoxil. Culture I did last week of the weeping area on the back of his left calf grew group B strep. I have therefore renewed the amoxicillin 500 3 times a day for a further week. He has not been systemically unwell. Still complaining of an area of discomfort right under his left popliteal fossa. There is no open wound on the right leg. He tells me that he is using his pumps twice a day on most days 03/09/18; patient arrives in clinic today completing his amoxicillin today. The cellulitis on his left leg is better. Furthermore he tells me that he had intramuscular penicillin shots that his primary care office today. However he also states that the wrap on his right leg fell down shortly after leaving clinic last week. He developed a large blister that was present when he came in for a nurse visit later in the week and then he developed intense discomfort around this area.He tells me he is using his compression pumps 03/16/18; the patient has completed his doxycycline. The infectious part of this/cellulitis in the left heel  area left popliteal area is a lot better. He has 2 open areas on the right calf. Still  areas on the left calf but this is a lot better as well. 03/24/18; the patient arrives complaining of pain in the left popliteal area again. He thinks some of this is wrap injury. He has no open area on the right leg and really no open area on the left calf either except for the popliteal area. He claims to be compliant with the compression pumps 03/31/18; I gave him doxycycline last week because of cellulitis in the left popliteal area. This is a lot better although the surface epithelium is denuded off and response to this. He arrives today with uncontrolled edema in the right calf area as well as a fingernail injury in the right lateral calf. There is only a few open areas on the left 04/06/18; I gave him amoxicillin doxycycline over the last 2 [name] that the amoxicillin should be completing currently. He is not complaining of any pain or systemic symptoms. The only open areas see has is on the right lateral lower leg paradoxically I cannot see anything on the left lower leg. He tells me he is using his compression pumps twice a day on most days. Silver alginate to the wounds that are open under 4 layer compression 04/13/18; he completed antibiotics and has no new complaints. Using his compression pumps. Silver alginate that anything that's opened 04/20/18; he is using his compression pumps religiously. Silver alginate 4 layer compression anything that's opened. He comes in today with no open wounds on the left leg but 3 on the right including a new one posteriorly. He has 2 on the right lateral and one on the right posterior. He likes Unna boots on the right leg for reasons that aren't really clear we had the usual 4 layer compression on the left. It may be necessary to move to the 4 layer compression on the right however for now I left them in the Unna boots 04/27/18; he is using his compression pumps at least once a day. He has still the wounds on the right lateral calf. The area right posteriorly  has closed. He does not have an open wound on the left under 4 layer compression however on the dorsal left foot just proximal to the toes and the left third toe 2 small open areas were identified 05/11/18; he has not uses compression pumps. The areas on the right lateral calf have coalesced into one large wound necrotic surface. On the left side he has one small wound anteriorly however the edema is now weeping out of a large part of his left leg. He says he wasn't using his pumps because of the weeping fluid. I explained to him that this is the time he needs to pump more 05/18/18; patient states he is using his compression pumps twice a day. The area on the right lateral large wound albeit superficial. On the left side he has innumerable number of small new wounds on the left calf particularly laterally but several anteriorly and medially. All these appear to have healthy granulated base these look like the remnants of blisters however they occurred under compression. The patient arrives in clinic today with his legs somewhat better. There is certainly less edema, less multiple open areas on the left calf and the right anterior leg looks somewhat better as well superficial and a little smaller. However he relates pain and erythema over the last 3-4 days in the  thigh and I looked at this today. He has not been systemically unwell no fever no chills no change in blood sugar values 05/25/18; comes in today in a better state. The severe cellulitis on his left leg seems better with the Keflex. Not as tender. He has not been systemically unwell Hard to find an open wound on the left lower leg using his compression pumps twice a day The confluent wounds on his right lateral calf somewhat better looking. These will ultimately need debridement I didn't do this today. 06/01/18; the severe cellulitis on the left anterior thigh has resolved and he is completed his Keflex. There is no open wound on the left leg  however there is a superficial excoriation at the base of the third toe dorsally. Skin on the bottom of his left foot is macerated looking. The left the wounds on the lateral right leg actually looks some better although he did require debridement of the top half of this wound area with an open curet 06/09/18 on evaluation today patient appears to be doing poorly in regard to his right lower extremity in particular this appears to likely be infected he has very thick purulent discharge along with a bright green tent to the discharge. This makes me concerned about the possibility of pseudomonas. He's also having increased discomfort at this point on evaluation. Fortunately there does not appear to be any evidence of infection spreading to the other location at this time. 06/16/18 on evaluation today patient appears to actually be doing fairly well. His ulcer has actually diminished in size quite significantly at this point which is good news. Nonetheless he still does have some evidence of infection he did see infectious disease this morning before coming here for his appointment. I did review the results of their evaluation and their note today. They did actually have him discontinue the Cipro and initiate treatment with linezolid at this time. He is doing this for the next seven days and they recommended a follow-up in four months with them. He is the keep a log of the need for intermittent antibiotic therapy between now and when he falls back up with infectious disease. This will help them gaze what exactly they need to do to try and help them out. 06/23/18; the patient arrives today with no open wounds on the left leg and left third toe healed. He is been using his compression pumps twice a day. On the right lateral leg he still has a sizable wound but this is a lot better than last time I saw this. In my absence he apparently cultured MRSA coming from this wound and is completed a course of  linezolid as has been directed by infectious disease. Has been using silver alginate under 4 layer compression 06/30/18; the only open wound he has is on the right lateral leg and this looks healthy. No debridement is required. We have been using silver alginate. He does not have an open wound on the left leg. There is apparently some drainage from the dorsal proximal third toe on the left although I see no open wound here. 07/03/18 on evaluation today patient was actually here just for a nurse visit rapid change. However when he was here on Wednesday for his rat change due to having been healed on the left and then developing blisters we initiated the wrap again knowing that he would be back today for [place] to reevaluate and see were at. Unfortunately he has developed some cellulitis into the  proximal portion of his right lower extremity even into the region of his thigh. 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 He was placed on one as what at that point. Nonetheless he is done with that and has been tolerating it well otherwise. Doxycycline which in the past really did not seem to be effective for him. Nonetheless I think the best option may be for [place] to definitely reinitiate the antibiotics for a longer period of time. 07/07/18; since I last saw this patient a week ago he has had a difficult time. At that point he did not have an open wound on his left leg. We transitioned him into juxta light stockings. He was apparently in the clinic the next day with blisters on the left lateral and left medial lower calf. He also had weeping edema fluid. He was put back into a compression wrap. He was also in the clinic on Friday with intense erythema in his right thigh. Per the patient he was started on Bactrim however that didn't work at all in terms of relieving his pain and swelling. He has taken 3 doxycycline that he had left over from last time and that seems to of  helped. He has blistering on the right thigh as well. 07/14/18; the erythema on his right thigh has gotten better with doxycycline that he is finishing. 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 Presumably this cellulitis in the thigh was not related to that although I think this is a bit concerning going forward. He still has an area on the right lateral calf the blister on the right medial calf just below the knee that was discussed above. On the left 2 small open areas left medial and left lateral. Edema control is adequate. He is using his compression pumps twice a day 07/20/18; continued improvement in the condition of both legs especially the edema in his bilateral thighs. He tells me he is been losing weight through a combination of diet and exercise. He is using his compression pumps twice a day. So overall she made to the remaining wounds 07/27/2018; continued improvement in condition of both legs. His edema is well controlled. The area on the right lateral leg is just about closed he had one blisters show up on the medial left upper calf. We have him in 4 layer compression. He is going on a 10-day trip to [place], [place] and [place]. He will be driving. He wants to wear Unna boots because of the lessening amount of constriction. He will not use compression pumps while he is away 08/05/18 on evaluation today patient actually appears to be doing decently well all things considered in regard to his bilateral lower extremities. The worst ulcer is actually only posterior aspect of his left lower extremity with a four layer compression wrap cut into his leg a couple [name] back. He did have a trip and actually had [employer] for the trip that he is worn since he was last here. Nonetheless he feels like the [employer] actually do better for him his swelling is up a little bit but he also with his trip was  not taking his Lasix on a regular set schedule like he was supposed to be. He states that obviously the reason being that he cannot drive and keep going without having to urinate too frequently  which makes it difficult. He did not have his pumps with him while he was away either which I think also maybe playing a role here too. 08/13/2018; the patient only has a small open wound on the right lateral calf which is a big improvement in the last month or 2. He also has the area posteriorly just below the posterior fossa on the left which I think was a wrap injury from several [name] ago. He has no current evidence of cellulitis. He tells me he is back into his compression pumps twice a day. He also tells me that while he was at the laundromat somebody stole a section of his extremitease stockings 08/20/2018; back in the clinic with a much improved state. He only has small areas on the right lateral mid calf which is just about healed. This was is more substantial area for quite a prolonged period of time. He has a small open area on the left anterior tibia. The area on the posterior calf just below the popliteal fossa is closed today. He is using his compression pumps twice a day 08/28/2018; patient has no open wound on the right leg. He has a smattering of open areas on the calf with some weeping lymphedema. More problematically than that it looks as though his wraps of slipped down in his usual he has very angry upper area of edema just below the right medial knee and on the right lateral calf. He has no open area on his feet. The patient is traveling to [facility] next week. I will send him in an antibiotic. We will continue to wrap the right leg. We ordered extremitease stockings for him last week and I plan to transition the right leg to a stocking when he gets home which will be in 10 days time. As usual he is very reluctant to take his pumps with him when he travels 09/07/2018; patient  returns from [facility]. He shows me a picture of his left leg in the mid part of his trip last week with intense fire engine erythema. The picture look bad enough I would have considered sending him to the hospital. Instead he went to the wound care center in [facility]. They did not prescribe him antibiotics but he did take some doxycycline he had leftover from a previous visit. I had given him trimethoprim sulfamethoxazole before he left this did not work according to the patient. This is resulted in some improvement fortunately. He comes back with a large wound on the left posterior calf. Smaller area on the left anterior tibia. Denuded blisters on the dorsal left foot over his toes. Does not have much in the way of wounds on the right leg although he does have a very tender area on the right posterior area just below the popliteal fossa also suggestive of infection. He promises me he is back on his pumps twice a day 09/15/2018; the intense cellulitis in his left lower calf is a lot better. The wound area on the posterior left calf is also so better. However he has reasonably extensive wounds on the dorsal aspect of his second and third toes and the proximal foot just at the base of the toes. There is nothing open on the right leg 09/22/2018; the patient has excellent edema control in his legs bilaterally. He is using his external compression pumps twice a day. He has no open area on the right leg and only the areas in the left foot dorsally  second and third toe area on the left side. He does not have any signs of active cellulitis. 10/06/2018; the patient has good edema control bilaterally. He has no open wound on the right leg. There is a blister in the posterior aspect of his left calf that we had to deal with today. He is using his compression pumps twice a day. There is no signs of active cellulitis. We have been using silver alginate to the wound areas. He still  has vulnerable areas on the base of his left first second toes dorsally He has a his extremities stockings and we are going to transition him today into the stocking on the right leg. He is cautioned that he will need to continue to use the compression pumps twice a day. If he notices uncontrolled edema in the right leg he may need to go to 3 times a day. 10/13/2018; the patient came in for a nurse check on Friday he has a large flaccid blister on the right medial calf just below the knee. We unroofed this. He has this and a new area underneath the posterior mid calf which was undoubtedly a blister as well. He also has several small areas on the right which is the area we put his extremities stocking on. 10/19/2018; the patient went to see infectious disease this morning I am not sure if that was a routine follow-up in any case the doxycycline I had given him was discontinued and started on linezolid. He has not started this. It is easy to look at his left calf and the inflammation and think this is cellulitis however he is very tender in the tissue just below the popliteal fossa and I have no doubt that there is infection going on here. He states the problem he is having is that with the compression pumps the edema goes down and then starts walking the wrap falls down. We will see if we can adhere this. He has 1 or 2 minuscule open areas on the right still areas that are weeping on the posterior left calf, the base of his left second and third toes 10/26/18; back today in clinic with quite of skin breakdown in his left anterior leg. This may have been infection the area below the popliteal fossa seems a lot better however tremendous epithelial loss on the left anterior mid tibia area over quite inexpensive tissue. He has 2 blisters on the right side but no other open wound here. 10/29/2018; came in urgently to see [place] today and we worked him in for review. He states that the 4 layer compression  on the right leg caused pain he had to cut it down to roughly his mid calf this caused swelling above the wrap and he has blisters and skin breakdown today. As a result of the pain he has not been using his pumps. Both legs are a lot more edematous and there is a lot of weeping fluid. 11/02/18; arrives in clinic with continued difficulties in the right leg> left. Leg is swollen and painful. multiple skin blisters and new open areas especially laterally. He has not been using his pumps on the right leg. 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 He is not systemically unwell. 11/09/2018; the patient claims he is being compliant with his pumps. He is finished the doxycycline I gave him last week. Culture I did of the wound on the right lateral leg showed a few very resistant methicillin  staph aureus. This was resistant to doxycycline. Nevertheless he states the pain in the leg is a lot better which makes me wonder if the cultured organism was not really what was causing the problem nevertheless this is a very dangerous organism to be culturing out of any wound. His right leg is still a lot larger than the left. He is using an Haematologist on this area, he blames a 4-layer compression for causing the original skin breakdown which I doubt is true however I cannot talk him out of it. We have been using silver alginate to all of these areas which were initially blisters 11/16/2018; patient is being compliant with his external compression pumps at twice a day. Miraculously he arrives in clinic today with absolutely no open wounds. He has better edema control on the left where he has been using 4 layer compression versus wound of wounds on the right and I pointed this out to him. There is no inflammation in the skin in his lower legs which is also somewhat unusual for him. There is no open wounds on the dorsal left foot. He has extremitease stockings at home and I  have asked him to bring these in next week. 11/25/18 patient's lower extremity on examination today on the left appears for the most part to be wound free. He does have an open wound on the lateral aspect of the right lower extremity but this is minimal compared to what I've seen in past. He does request that we go ahead and wrap the left leg as well even though there's nothing open just so hopefully it will not reopen in short order. 1/28; patient has superficial open wounds on the right lateral calf left anterior calf and left posterior calf. His edema control is adequate. 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 We have been using silver alginate as the primary dressing. He claims compliance with his compression pumps 2/4; patient has superficial open wounds on numerous areas of his left calf and again one on the left dorsal foot. The areas on the right lateral calf have healed. The cellulitis that I gave him doxycycline for last week is also resolved this was mostly on the left anterior calf just below the tibial tuberosity. His edema looks fairly well-controlled. He tells me he went to see his primary doctor today and had blood work ordered 2/11; once again he has several open areas on the left calf left tibial area. Most of these are small and appear to have healthy granulation. He does not have anything open on the right. The edema and control in his thighs is pretty good which is usually a good indication he has been using his pumps as requested. 2/18; he continues to have several small areas on the left calf and left tibial area. Most of these are small healthy granulation. We put him in his stocking on the right leg last week and he arrives with a superficial open area over the right upper tibia and a fairly large area on the right lateral tibia in similar condition. His edema control actually does not  look too bad, he claims to be using his compression pumps twice a day 2/25. Continued small areas on the left calf and left tibial area. New areas especially on the right are identified just below the tibial tuberosity and on the right upper tibia itself. There are also areas of  weeping edema fluid even without an obvious wound. He does not have a considerable degree of lymphedema but clearly there is more edema here than his skin can handle. He states he is using the pumps twice a day. We have an Unna boot on the right and 4 layer compression on the left. 3/3; he continues to have an area on the right lateral calf and right posterior calf just below the popliteal fossa. There is a fair amount of tenderness around the wound on the popliteal fossa but I did not see any evidence of cellulitis, could just be that the wrap came down and rubbed in this area. He does not have an open area on the left leg however there is an area on the left dorsal foot at the base of the third toe We have been using silver alginate to all wound areas 3/10; he did not have an open area on his left leg last time he was here a week ago. Today he arrives with a horizontal wound just below the tibial tuberosity and an area on the left lateral calf. He has intense erythema and tenderness in this area. The area is on the right lateral calf and right posterior calf better than last week. We have been using silver alginate as usual 3/18 - Patient returns with 3 small open areas on left calf, and 1 small open area on right calf, the skin looks ok with no significant erythema, he continues the UNA boot on right and 4 layer compression on left. The right lateral calf wound is closed , the right posterior is small area. we will continue silver alginate to the areas. Culture results from right posterior calf wound is + MRSA sensitive to Bactrim but resistant to DOXY 01/27/19 on evaluation today patient's bilateral lower extremities  actually appear to be doing fairly well at this point which is good news. He is been tolerating the dressing changes without complication. Fortunately she has made excellent improvement in regard to the overall status of his wounds. Unfortunately every time we cease wrapping him he ends up reopening in causing more significant issues at that point. Again I'm unsure of the best direction to take although I think the lymphedema clinic may be appropriate for him. 02/03/19 on evaluation today patient appears to be doing well in regard to the wounds that we saw him for last week unfortunately he has a new area on the proximal portion of his right medial/posterior lower extremity where the wrap somewhat slowed down and caused swelling and a blister to rub and open. Unfortunately this is the only opening that he has on either leg at this point. 02/17/19 on evaluation today patient's bilateral lower extremities appear to be doing well. He still completely healed in regard to the left lower extremity. In regard to the right lower extremity the area where the wrap and slid down and caused the blister still seems to be slightly open although this is dramatically better than during the last evaluation two [name] ago. I'm very pleased with the way this stands overall. 03/03/19 on evaluation today patient appears to be doing well in regard to his right lower extremity in general although he did have a new blister open this does not appear to be showing any evidence of active infection at this time. Fortunately there's No fevers, chills, nausea, or vomiting noted at this time. Overall I feel like he is making good progress it does feel like that the right leg will  we perform the [employer] seems to do with a bit better than three layer wrap on the left which slid down on him. We may switch to doing bilateral in the book wraps. 5/4; I have not seen Mr. Grabert in quite some time. According to our case manager he did not  have an open wound on his left leg last week. He had 1 remaining wound on the right posterior medial calf. He arrives today with multiple openings on the left leg probably were blisters and/or wrap injuries from Unna boots. I do not think the Unna boot's will provide adequate compression on the left. I am also not clear about the frequency he is using the compression pumps. 03/17/19 on evaluation today patient appears to be doing excellent in regard to his lower extremities compared to last week's evaluation apparently. He had gotten significantly worse last week which is unfortunate. The [employer] wrap on the left did not seem to do very well for him at all and in fact it didn't control his swelling significantly enough he had an additional outbreak. Subsequently we go back to the four layer compression wrap on the left. This is good news. At least in that he is doing better and the wound seem to be killing him. He still has not heard anything from the lymphedema clinic. 03/24/19 on evaluation today patient actually appears to be doing much better in regard to his bilateral lower Trinity as compared to last week when I saw him. Fortunately there's no signs of active infection at this time. He has been tolerating the dressing changes without complication. Overall I'm extremely pleased with the progress and appearance in general. 04/07/19 on evaluation today patient appears to be doing well in regard to his bilateral lower extremities. His swelling is significantly down from where it was previous. With that being said he does have a couple blisters still open at this point but fortunately nothing that seems to be too severe and again the majority of the larger openings has healed at this time. 04/14/19 on evaluation today patient actually appears to be doing quite well in regard to his bilateral lower extremities in fact I'm not even sure there's anything significantly open at this time at any site.  Nonetheless he did have some trouble with these wraps where they are somewhat irritating him secondary to the fact that he has noted that the graph wasn't too close down to the end of this foot in a little bit short as well up to his knee. Otherwise things seem to be doing quite well. 04/21/19 upon evaluation today patient's wound bed actually showed evidence of being completely healed in regard to both lower extremities which is excellent news. There does not appear to be any signs of active infection which is also good news. I'm very pleased in this regard. No fevers, chills, nausea, or vomiting noted at this time. 04/28/19 on evaluation today patient appears to be doing a little bit worse in regard to both lower extremities on the left mainly due to the fact that when he went infection disease the wrap was not wrapped quite high enough he developed a blister above this. On the right he is a small open area of nothing too significant but again this is continuing to give him some trouble he has been were in the Velcro compression that he has at home. 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 He's  been tolerating the dressing changes without complication. Fortunately there's no signs of active infection at this time. No fevers, chills, nausea, or vomiting noted at this time. We have been trying to get an appointment with her lymphedema clinic in [facility] but unfortunately nobody can get them on phone with not been able to even fax information over the patient likewise is not been able to get in touch with them. Overall I'm not sure exactly what's going on here with to reach out again today. 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 Still having a lot of drainage unfortunately. He tells me especially in the left but even on the right.  There's no signs of active infection which is good news we've been using so ratcheted up to this point. 05/19/19 on evaluation today patient actually appears to be doing quite well with regard to his left lower extremity which is great news. Fortunately in regard to the right lower extremity has an issues with his wrap and he subsequently did remove this from what I'm understanding. Nonetheless long story short is what he had rewrapped once he removed it subsequently had maggots underneath this wrap whenever he came in for evaluation today. With that being said they were obviously completely cleaned away by the nursing staff. The visit today which is excellent news. However he does appear to potentially have some infection around the right ankle region where the maggots were located as well. He will likely require anabiotic therapy today. 05/26/19 on evaluation today patient actually appears to be doing much better in regard to his bilateral lower extremities. I feel like the infection is under much better control. With that being said there were maggots noted when the wrap was removed yet again today. Again this could have potentially been left over from previous although at this time there does not appear to be any signs of significant drainage there was obviously on the wrap some drainage as well this contracted gnats or otherwise. Either way I do not see anything that appears to be doing worse in my pinion and in fact I think his drainage has slowed down quite significantly likely mainly due to the fact to his infection being under better control. 06/02/2019 on evaluation today patient actually appears to be doing well with regard to his bilateral lower extremities there is no signs of active infection at this time which is great news. With that being said he does have several open areas more so on the right than the left but nonetheless these are all significantly better than previously  noted. 06/09/2019 on evaluation today patient actually appears to be doing well. His wrap stayed up and he did not cause any problems he had more drainage on the right compared to the left but overall I do not see any major issues at this time which is great news. 06/16/2019 on evaluation today patient appears to be doing excellent with regard to his lower extremities the only area that is open is a new blister that can have opened as of today on the medial ankle on the left. Other than this he really seems to be doing great I see no major issues at this point. 06/23/2019 on evaluation today patient appears to be doing quite well with regard to his bilateral lower extremities. In fact he actually appears to be almost completely healed there is a small area of weeping noted  of the right lower extremity just above the ankle. Nonetheless fortunately there is no signs of active infection at this time which is good news. No fevers, chills, nausea, vomiting, or diarrhea. 8/24; the patient arrived for a nurse visit today but complained of very significant pain in the left leg and therefore I was asked to look at this. Noted that he did not have an open area on the left leg last week nevertheless this was wrapped. The patient states that he is not been able to put his compression pumps on the left leg because of the discomfort. He has not been systemically unwell 06/30/2019 on evaluation today patient unfortunately despite being excellent last week is doing much worse with regard to his left lower extremity today. In fact he had to come in for a nurse on Monday where his left leg had to be rewrapped due to excessive weeping Dr. Leanord Hawking placed him on doxycycline at that point. Fortunately there is no signs of active infection Systemically at this time which is good news. 07/07/2019 in regard to the patient's wounds today he actually seems to be doing well with his right lower extremity there really is nothing  open or draining at this point this is great news. Unfortunately the left lower extremity is given him additional trouble at this time. There does not appear to be any signs of active infection nonetheless he does have a lot of edema and swelling noted at this point as well as blistering all of which has led to a much more poor appearing leg at this time compared to where it was 2 [name] ago when it was almost completely healed. Obviously this is a little discouraging for the patient. He is try to contact the [place]hed[place] he has not been able to get through to them. 07/14/2019 on evaluation today patient actually appears to be doing slightly better with regard to his left lower extremity ulcers. Overall I do feel like at least at the top of the wrap that we have been placing this area has healed quite nicely and looks much better. The remainder of the leg is showing signs of improvement. Unfortunately in the thigh area he still has an open region on the left and again on the right he has been utilizing just a Band-Aid on an area that also opened on the thigh. Again this is an area that were not able to wrap although we did do an Ace wrap to provide some compression that something that obviously is a little less effective than the compression wraps we have been using on the lower portion of the leg. He does have an appointment with the lymphedema clinic in [facility] on Friday. 07/21/2019 on evaluation today patient appears to be doing better with regard to his lower extremity ulcers. He has been tolerating the dressing changes without complication. Fortunately there is no signs of active infection at this time. No fevers, chills, nausea, vomiting, or diarrhea. I did receive the paperwork from the physical therapist at the lymphedema clinic in [place]. Subsequently I signed off on that this morning and sent that back to him for further progression with the treatment  plan. 07/28/2019 on evaluation today patient appears to be doing very well with regard to his right lower extremity where I do not see any open wounds at this point. Fortunately he is feeling great as far as that is concerned as well. In regard to the left lower extremity he has been having  issues with still several areas of weeping and edema although the upper leg is doing better his lower leg still I think is going require the compression wrap at this time. No fevers, chills, nausea, vomiting, or diarrhea. 08/04/2019 on evaluation today patient unfortunately is having new wounds on the right lower extremity. Again we have been using Unna boot wrap on that side. We switched him to using his juxta lite wrap at home. With that being said he tells me he has been using it although his legs extremely swollen and to be honest really does not appear that he has been. I cannot know that for sure however. Nonetheless he has multiple new wounds on the right lower extremity at this time. Obviously we will have to see about getting this rewrapped for him today. 08/11/2019 on evaluation today patient appears to be doing fairly well with regard to his wounds. He has been tolerating the dressing changes including the compression wraps without complication. He still has a lot of edema in his upper thigh regions bilaterally he is supposed to be seeing the lymphedema clinic on the 15th of this month once his wraps arrive for the upper part of his legs. 08/18/2019 on evaluation today patient appears to be doing well with regard to his bilateral lower extremities at this point. He has been tolerating the dressing changes without complication. Fortunately there is no signs of active infection which is also good news. He does have a couple weeping areas on the first and second toe of the right foot he also has just a small area on the left foot upper leg and a small area on the left lower leg but overall he is  doing quite well in my opinion. He is supposed to be getting his wraps shortly in fact tomorrow and then subsequently is seeing the lymphedema clinic next Wednesday on the 21st. Of note he is also leaving on the 25th to go on vacation for a week to the beach. For that reason and since there is some uncertainty about what there can be doing at lymphedema clinic next Wednesday I am get a make an appointment for next Friday here for [place] to see what we need to do for him prior to him leaving for vacation. 10/23; patient arrives in considerable pain predominantly in the upper posterior calf just distal to the popliteal fossa also in the wound anteriorly above the major wound. This is probably cellulitis and he has had this recurrently in the past. He has no open wound on the right side and he has had an Radio broadcast assistant in that area. Finally I note that he has an area on the left posterior calf which by enlarge is mostly epithelialized. This protrudes beyond the borders of the surrounding skin in the setting of dry scaly skin and lymphedema. The patient is leaving for [facility] on Sunday. Per his longstanding pattern, he will not take his compression pumps with him predominantly out of fear that they will be stolen. He therefore asked that we put a Unna boot back on the right leg. He will also contact the wound care center in [facility] to see if they can change his dressing in the mid week. Electronic Signature(s) Signed: 08/27/2019 6:06:00 PM By: Baltazar Najjar MD Entered By: Baltazar Najjar on 08/27/2019 12:54:17 -------------------------------------------------------------------------------- Physical Exam Details Patient Name: Date of Service: [name], [name] 08/27/2019 10:30 AM Medical Record [id number] Patient Account Number: [id number] Date of Birth/Sex: Treating RN: April 15, 1951 (68  y.o. Judie Petit) [name] Primary Care Provider: Nicoletta [name] Other Clinician: Referring Provider:  Treating Provider/Extender:[name], [name], [name] [name] in Treatment: 187 Constitutional Patient is hypertensive.. Pulse regular and within target range for patient.Marland Kitchen Respirations regular, non-labored and within target range.. Temperature is normal and within the target range for the patient.Marland Kitchen Appears in no distress. Respiratory work of breathing is normal. Bilateral breath sounds are clear and equal in all lobes with no wheezes, rales or rhonchi.. Cardiovascular Heart sounds are regular no murmurs JVP or not elevated. He looks euvolemic. Integumentary (Hair, Skin) Intense erythema in the left lower extremity. Large open areas anteriorly and laterally. There is tenderness especially posteriorly suggestive of cellulitis.Marland Kitchen Psychiatric appears at normal baseline. Notes Wound exam; right leg is no open area. Considerable deterioration in the left lower extremity with erythema and tenderness especially just below the knee anteriorly and posteriorly. Large open wounds anteriorly and laterally. Electronic Signature(s) Signed: 08/27/2019 6:06:00 PM By: Baltazar Najjar MD Entered By: Baltazar Najjar on 08/27/2019 12:55:59 -------------------------------------------------------------------------------- Physician Orders Details Patient Name: Date of Service: [name], [name] 08/27/2019 10:30 AM Medical Record [id number] Patient Account Number: [id number] Date of Birth/Sex: Treating RN: May 15, 1951 (68 y.o. 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 Dressing Change Frequency Do not change entire dressing for one week. - both legs Skin Barriers/Peri-Wound Care TCA Cream or Ointment - mix with lotion both legs Wound Cleansing May shower with protection. Primary Wound Dressing Wound #132R Left,Medial Upper Leg Calcium Alginate with Silver Wound #140 Left,Circumferential Lower Leg Calcium Alginate with Silver Secondary Dressing Wound #132R Left,Medial Upper Leg Foam Border Wound #140 Left,Circumferential Lower Leg Dry Gauze ABD pad Kerramax Carboflex Edema Control 4 layer compression: Left lower extremity - unna boot at upper portion of lower leg. Unna Boot to Right Lower Extremity Avoid standing for long periods of time Elevate legs to the level of the heart or above for 30 minutes daily and/or when sitting, a frequency of: Exercise regularly Segmental Compressive Device. - lymphadema pumps 60 min 2 times per day Additional Orders / Instructions Other: - start oral antibiotics and complete all antibiotics. Patient Medications Allergies: No Known Allergies Notifications Medication Indication Start End doxycycline monohydrate cellulitis left leg 08/27/2019 DOSE oral 100 mg capsule - 1 capsule oral bid for 10 days Electronic Signature(s) Signed: 08/27/2019 1:03:26 PM By: Baltazar Najjar MD Previous Signature: 08/27/2019 11:30:13 AM Version By: Baltazar Najjar MD Entered By: Baltazar Najjar on 08/27/2019 13:03:20 -------------------------------------------------------------------------------- Problem List Details Patient Name: Date of Service: [name] 08/27/2019 10:30 AM Medical  Record [id number] Patient Account Number: [id number] Date of Birth/Sex: Treating RN: 03-24-1951 (68 y.o. [name] Primary Care Provider: Nicoletta [name] Other Clinician: Referring Provider: Treating Provider/Extender:[name], [name], [name] [name] in Treatment: 187 Active Problems ICD-10 Evaluated Encounter Code Description Active Date Today Diagnosis L97.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 right calf limited to 06/30/2018 No Yes breakdown  of skin L97.221 Non-pressure chronic ulcer of left calf limited to 09/30/2016 No Yes breakdown of skin I87.333 Chronic venous hypertension (idiopathic) with ulcer 01/22/2016 No Yes and inflammation of bilateral lower extremity I89.0 Lymphedema, not elsewhere classified 01/22/2016 No Yes E11.622 Type 2 diabetes mellitus with other skin ulcer 01/22/2016 No Yes 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, 01/22/2016 No Yes unspecified L03.116 Cellulitis of left lower limb 04/01/2017 No Yes Inactive Problems ICD-10 Code Description Active Date Inactive Date 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 06/30/2017 skin L97.521 Non-pressure chronic ulcer of other part of left foot limited to 04/27/2018 04/27/2018 breakdown of skin L03.115 Cellulitis of right lower limb 12/22/2017 12/22/2017 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 with other specified 06/30/2018 06/30/2018 severity L97.511 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 06/30/2018 06/30/2018 breakdown of skin Resolved Problems Electronic Signature(s) Signed: 08/27/2019 6:06:00 PM By: Baltazar Najjar MD Entered By: Baltazar Najjar on 08/27/2019 12:51:54 -------------------------------------------------------------------------------- Progress Note Details Patient Name: Date of Service: [name] 08/27/2019 10:30 AM Medical Record [id number] Patient Account Number: [id number] Date of  Birth/Sex: Treating RN: [birth year] (68 y.o. [name] Primary Care Provider: Nicoletta [name] Other Clinician: Referring Provider: Treating Provider/Extender:[name], [name], [name] [name] in Treatment: 187 Subjective History of Present Illness (HPI) Referred by PCP for consultation. Patient has long standing history of BLE venous stasis, no prior ulcerations. At beginning of month, developed cellulitis and weeping. Received IM Rocephin followed by Keflex and resolved. Wears compression stocking, appr 6 months old. Not sure strength. No present drainage. 01/22/16 this is a patient who is a type II diabetic on insulin. 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 He tells me he religiously wears pressure stockings of uncertain strength. He was here with weeping edema about 8 months ago but did not have an open wound. Roughly a month ago he had a reopening on his bilateral legs. He is been using bandages and Neosporin. He does not complain of pain. 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 Last BUN/creatinine I have is from 11/20/15 at 13 and 1.0 respectively 01/29/16; patient arrives today having tolerated the Profore wrap. He brought in his stockings and these are 18 mmHg stockings he bought from [place]. The compression here is likely inadequate. He does not complain of pain or excessive drainage she has no systemic symptoms. The wound on the right looks improved as does the one on the left although one on the left is more substantial with still tissue at risk below the actual wound area on the bilateral posterior calf 02/05/16; patient arrives with poor edema control. He states that we did put a 4 layer compression on it last week. No weight appear 5 this. 02/12/16; the area on the posterior right Has healed. The left Has a substantial wound  that has necrotic surface eschar that requires a debridement with a curette. 02/16/16;the patient called or a Nurse visit secondary to increased swelling. He had been in earlier in the week with his right leg healed. He was transitioned to is on pressure stocking on the right leg with the only open wound on the left, a substantial area on the left posterior calf. Note he has a history of severe lower extremity edema, he has a history of chronic atrial fibrillation but not heart failure per  my notes but I'll need to research this. He is not complaining of chest pain shortness of breath or orthopnea. The intake nurse noted blisters on the previously closed right leg 02/19/16; this is the patient's regular visit day. I see him on Friday with escalating edema new wounds on the right leg and clear signs of at least right ventricular heart failure. I increased his Lasix to 40 twice a day. He is returning currently in follow-up. States he is noticed a decrease in that the edema 02/26/16 patient's legs have much less edema. There is nothing really open on the right leg. The left leg has improved condition of the large superficial wound on the posterior left leg 03/04/16; edema control is very much better. The patient's right leg wounds have healed. On the left leg he continues to have severe venous inflammation on the posterior aspect of the left leg. There is no tenderness and I don't think any of this is cellulitis. 03/11/16; patient's right leg is married healed and he is in his own stocking. The patient's left leg has deteriorated somewhat. There is a lot of erythema around the wound on the posterior left leg. There is also a significant rim of erythema posteriorly just above where the wrap would've ended there is a new wound in this location and a lot of tenderness. Can't rule out cellulitis in this area. 03/15/16; patient's right leg remains healed and he is in his own stocking. The patient's left leg is  much better than last review. His major wound on the posterior aspect of his left Is almost fully epithelialized. He has 3 small injuries from the wraps. Really. Erythema seems a lot better on antibiotics 03/18/16; right leg remains healed and he is in his own stocking. The patient's left leg is much better. The area on the posterior aspect of the left calf is fully epithelialized. His 3 small injuries which were wrap injuries on the left are improved only one seems still open his erythema has resolved 03/25/16; patient's right leg remains healed and he is in his own stocking. There is no open area today on the left leg posterior leg is completely closed up. His wrap injuries at the superior aspect of his leg are also resolved. He looks as though he has some irritation on the dorsal ankle but this is fully epithelialized without evidence of infection. 03/28/16; we discharged this patient on Monday. Transitioned him into his own stocking. There were problems almost immediately with uncontrolled swelling weeping edema multiple some of which have opened. He does not feel systemically unwell in particular no chest pain no shortness of breath and he does not feel 04/08/16; the edema is under better control with the Profore light wrap but he still has pitting edema. There is one large wound anteriorly 2 on the medial aspect of his left leg and 3 small areas on the superior posterior calf. Drainage is not excessive he is tolerating a Profore light well 04/15/16; put a Profore wrap on him last week. This is controlled is edema however he had a lot of pain on his left anterior foot most of his wounds are healed 04/22/16 once again the patient has denuded areas on the left anterior foot which he states are because his wrap slips up word. He saw his primary physician today is on Lasix 40 twice a day and states that he his weight is down 20 pounds over the last 3 months. 04/29/16: Much improved. left anterior foot  much improved. He is now on Lasix 80 mg per day. Much improved edema control 05/06/16; I was hoping to be able to discharge him today however once again he has blisters at a low level of where the compression was placed last week mostly on his left lateral but also his left medial leg and a small area on the anterior part of the left foot. 05/09/16; apparently the patient went home after his appointment on 7/4 later in the evening developing pain in his upper medial thigh together with subjective fever and chills although his temperature was not taken. The pain was so intense he felt he would probably have to call 911. However he then remembered that he had leftover doxycycline from a previous round of antibiotics and took these. By the next morning he felt a lot better. He called and spoke to one of our nurses and I approved doxycycline over the phone thinking that this was in relation to the wounds we had previously seen although they were definitely were not. The patient feels a lot better old fever no chills he is still working. Blood sugars are reasonably controlled 05/13/16; patient is back in for review of his cellulitis on his anterior medial upper thigh. He is taking doxycycline this is a lot better. Culture I did of the nodular area on the dorsal aspect of his foot grew MRSA this also looks a lot better. 05/20/16; the patient is cellulitis on the medial upper thigh has resolved. All of his wound areas including the left anterior foot, areas on the medial aspect of the left calf and the lateral aspect of the calf at all resolved. He has a new blister on the left dorsal foot at the level of the fourth toe this was excised. No evidence of infection 05/27/16; patient continues to complain weeping edema. He has new blisterlike wounds on the left anterior lateral and posterior lateral calf at the top of his wrap levels. The area on his left anterior foot appears better. He is not complaining of  fever, pain or pruritus in his feet. 05/30/16; the patient's blisters on his left anterior leg posterior calf all look improved. He did not increase the Lasix 100 mg as I suggested because he was going to run out of his 40 mg tablets. He is still having weeping edema of his toes 06/03/16; I renewed his Lasix at 80 mg once a day as he was about to run out when I last saw him. He is on 80 mg of Lasix now. I have asked him to cut down on the excessive amount of water he was drinking and asked him to drink according to his thirst mechanisms 06/12/2016 -- was seen 2 days ago and was supposed to wear his compression stockings at home but he is developed lymphedema and superficial blisters on the left lower extremity and hence came in for a review 06/24/16; the remaining wound is on his left anterior leg. He still has edema coming from between his toes. There is lymphedema here however his edema is generally better than when I last saw this. He has a history of atrial fibrillation but does not have a known history of congestive heart failure nevertheless I think he probably has this at least on a diastolic basis. 07/01/16 I reviewed his echocardiogram from January 2017. This was essentially normal. He did not have LVH, EF of 55-60%. His right ventricular function was normal although he did have trivial tricuspid and pulmonic regurgitation. This  is not audible on exam however. I increased his Lasix to do massive edema in his legs well above his knees I think in early July. He was also drinking an excessive amount of water at the time. 07/15/16; missed his appointment last week because of the Labor Day holiday on Monday. He could not get another appointment later in the week. Started to feel the wrap digging in superiorly so we remove the top half and the bottom half of his wrap. He has extensive erythema and blistering superiorly in the left leg. Very tender. Very swollen. Edema in his foot with leaking edema  fluid. He has not been systemically unwell 07/22/16; the area on the left leg laterally required some debridement. The medial wounds look more stable. His wrap injury wounds appear to have healed. Edema and his foot is better, weeping edema is also better. He tells me he is meeting with the supplier of the external compression pumps at work 08/05/16; the patient was on vacation last week in [facility]. His wrap is been on for an extended period of time. Also over the weekend he developed an extensive area of tender erythema across his anterior medial thigh. He took to doxycycline yesterday that he had leftover from a previous prescription. The patient complains of weeping edema coming out of his toes 08/08/16; I saw this patient on 10/2. He was tender across his anterior thigh. I put him on doxycycline. He returns today in follow-up. He does not have any open wounds on his lower leg, he still has edema weeping into his toes. 08/12/16; patient was seen back urgently today to follow-up for his extensive left thigh cellulitis/erysipelas. He comes back with a lot less swelling and erythema pain is much better. I believe I gave him Augmentin and Cipro. His wrap was cut down as he stated a roll down his legs. He developed blistering above the level of the wrap that remained. He has 2 open blisters and 1 intact. 08/19/16; patient is been doing his primary doctor who is increased his Lasix from 40-80 once a day or 80 already has less edema. Cellulitis has remained improved in the left thigh. 2 open areas on the posterior left calf 08/26/16; he returns today having new open blisters on the anterior part of his left leg. He has his compression pumps but is not yet been shown how to use some vital representative from the supplier. 09/02/16 patient returns today with no open wounds on the left leg. Some maceration in his plantar toes 09/10/2016 -- Dr. Leanord Hawking had recently discharged him on 09/02/2016 and he has  come right back with redness swelling and some open ulcers on his left lower extremity. He says this was caused by trying to apply his compression stockings and he's been unable to use this and has not been able to use his lymphedema pumps. He had some doxycycline leftover and he has started on this a few days ago. 09/16/16; there are no open wounds on his leg on the left and no evidence of cellulitis. 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 He does not complain of symptoms here. I am not clear use using his external compression pumps. 09/23/16; I have not seen this patient in 2 [name]. He canceled his appointment 10 days ago as he was going on vacation. He tells me that on Monday he noticed a large area on his posterior left leg which is been draining  copiously and is reopened into a large wound. He is been using ABDs and the external part of his juxtalite, according to our nurse this was not on properly. 10/07/16; Still a substantial area on the posterior left leg. Using silver alginate 10/14/16; in general better although there is still open area which looks healthy. Still using silver alginate. He reminds me that this happen before he left for [facility]. Today while he was showering in the morning. He had been using his juxtalite's 10/21/16; the area on his posterior left leg is fully epithelialized. However he arrives today with a large area of tender erythema in his medial and posterior left thigh just above the knee. I have marked the area. Once again he is reluctant to consider hospitalization. I treated him with oral antibiotics in the past for a similar situation with resolution I think with doxycycline however this area it seems more extensive to me. He is not complaining of fever but does have chills and says states he is thirsty. His blood sugar today was in the 140s at home 10/25/16 the area on his posterior left leg is  fully epithelialized although there is still some weeping edema. The large area of tenderness and erythema in his medial and posterior left thigh is a lot less tender although there is still a lot of swelling in this thigh. He states he feels a lot better. He is on doxycycline and Augmentin that I started last week. This will continued until Tuesday, December 26. I have ordered a duplex ultrasound of the left thigh rule out DVT whether there is an abscess something that would need to be drained I would also like to know. 11/01/16; he still has weeping edema from a not fully epithelialized area on his left posterior calf. Most of the rest of this looks a lot better. He has completed his antibiotics. His thigh is a lot better. Duplex ultrasound did not show a DVT in the thigh 11/08/16; he comes in today with more Denuded surface epithelium from the posterior aspect of his calf. There is no real evidence of cellulitis. The superior aspect of his wrap appears to have put quite an indentation in his leg just below the knee and this may have contributed. He does not complain of pain or fever. We have been using silver alginate as the primary dressing. The area of cellulitis in the right thigh has totally resolved. He has been using his compression stockings once a week 11/15/16; the patient arrives today with more loss of epithelium from the posterior aspect of his left calf. He now has a fairly substantial wound in this area. The reason behind this deterioration isn't exactly clear although his edema is not well controlled. He states he feels he is generally more swollen systemically. He is not complaining of chest pain shortness of breath fever. Tells me he has an appointment with his primary physician in early February. He is on 80 mg of oral Lasix a day. He claims compliance with the external compression pumps. He is not having any pain in his legs similar to what he has with his recurrent  cellulitis 11/22/16; the patient arrives a follow-up of his large area on his left lateral calf. This looks somewhat better today. He came in earlier in the week for a dressing change since I saw him a week ago. He is not complaining of any pain no shortness of breath no chest pain 11/28/16; the patient arrives for follow-up of his  large area on the left lateral calf this does not look better. In fact it is larger weeping edema. The surface of the wound does not look too bad. We have been using silver alginate although I'm not certain that this is a dressing issue. 12/05/16; again the patient follows up for a large wound on the left lateral and left posterior calf this does not look better. There continues to be weeping edema necrotic surface tissue. More worrisome than this once again there is erythema below the wound involving the distal Achilles and heel suggestive of cellulitis. He is on his feet working most of the day of this is not going well. We are changing his dressing twice a week to facilitate the drainage. 12/12/16; not much change in the overall dimensions of the large area on the left posterior calf. This is very inflamed looking. I gave him an. Doxycycline last week does not really seem to have helped. He found the wrap very painful indeed it seems to of dog into his legs superiorly and perhaps around the heel. He came in early today because the drainage had soaked through his dressings. 12/19/16- patient arrives for follow-up evaluation of his left lower extremity ulcers. He states that he is using his lymphedema pumps once daily when there is "no drainage". He admits to not using his lipedema pumps while under current treatment. His blood sugars have been consistently between 150-200. 12/26/16; the patient is not using his compression pumps at home because of the wetness on his feet. I've advised him that I think it's important for him to use this daily. He finds his feet too wet, he can  put a plastic bag over his legs while he is in the pumps. Otherwise I think will be in a vicious circle. We are using silver alginate to the major area on his left posterior calf 01/02/17; the patient's posterior left leg has further of all into 3 open wounds. All of them covered with a necrotic surface. He claims to be using his compression pumps once a day. His edema control is marginal. Continue with silver alginate 01/10/17; the patient's left posterior leg actually looks somewhat better. There is less edema, less erythema. Still has 3 open areas covered with a necrotic surface requiring debridement. He claims to be using his compression pumps once a day his edema control is better 01/17/17; the patient's left posterior calf look better last week when I saw him and his wrap was changed 2 days ago. He has noted increasing pain in the left heel and arrives today with much larger wounds extensive erythema extending down into the entire heel area especially tender medially. He is not systemically unwell CBGs have been controlled no fever. Our intake nurse showed me limegreen drainage on his AVD pads. 01/24/17; his usual this patient responds nicely to antibiotics last week giving him Levaquin for presumed Pseudomonas. The whole entire posterior part of his leg is much better much less inflamed and in the case of his Achilles heel area much less tender. He has also had some epithelialization posteriorly there are still open areas here and still draining but overall considerably better 01/31/17- He has continue to tolerate the compression wraps. he states that he continues to use the lymphedema pumps daily, and can increase to twice daily on the weekends. He is voicing no complaints or concerns regarding his LLE ulcers 02/07/17-he is here for follow-up evaluation. He states that he noted some erythema to the left medial and  anterior thigh, which he states is new as of yesterday. He is concerned about  recurrent cellulitis. He states his blood sugars have been slightly elevated, this morning in the 180s 02/14/17; he is here for follow-up evaluation. When he was last here there was erythema superiorly from his posterior wound in his anterior thigh. He was prescribed Levaquin however a culture of the wound surface grew MRSA over the phone I changed him to doxycycline on Monday and things seem to be a lot better. 02/24/17; patient missed his appointment on Friday therefore we changed his nurse visit into a physician visit today. Still using silver alginate on the large area of the posterior left thigh. He isn't new area on the dorsal left second toe 03/03/17; actually better today although he admits he has not used his external compression pumps in the last 2 days or so because of work responsibilities over the weekend. 03/10/17; continued improvement. External compression pumps once a day almost all of his wounds have closed on the posterior left calf. Better edema control 03/17/17; in general improved. He still has 3 small open areas on the lateral aspect of his left leg however most of the area on the posterior part of his leg is epithelialized. He has better edema control. He has an ABD pad under his stocking on the right anterior lower leg although he did not let us look at that today. 03/24/17; patient arrives back in clinic today with no open areas however there are areas on the posterior left calf and anterior left calf that are less than 100% epithelialized. His edema is well controlled in the left lower leg. There is some pitting edema probably lymphedema in the left upper thigh. He uses compression pumps at home once per day. I tried to get him to do this twice a day although he is very reticent. 04/01/2017 -- for the last 2 days he's had significant redness, tenderness and weeping and came in for an urgent visit today. 04/07/17; patient still has 6 more days of doxycycline. He was seen by Dr.  Meyer Russel last Wednesday for cellulitis involving the posterior aspect, lateral aspect of his Involving his heel. For the most part he is better there is less erythema and less weeping. He has been on his feet for 12 hours o2 over the weekend. Using his compression pumps once a day 04/14/17 arrives today with continued improvement. Only one area on the posterior left calf that is not fully epithelialized. He has intense bilateral venous inflammation associated with his chronic venous insufficiency disease and secondary lymphedema. We have been using silver alginate to the left posterior calf wound In passing he tells [place] today that the right leg but we have not seen in quite some time has an open area on it but he doesn't want [place] to look at this today states he will show this to [place] next week. 04/21/17; there is no open area on his left leg although he still reports some weeping edema. He showed [place] his right leg today which is the first time we've seen this leg in a long time. He has a large area of open wound on the right leg anteriorly healthy granulation. Quite a bit of swelling in the right leg and some degree of venous inflammation. He told us about the right leg in passing last week but states that deterioration in the right leg really only happened over the weekend 04/28/17; there is no open area on the left leg although  there is an irritated part on the posterior which is like a wrap injury. The wound on the right leg which was new from last week at least to [place] is a lot better. 05/05/17; still no open area on the left leg. Patient is using his new compression stocking which seems to be doing a good job of controlling the edema. He states he is using his compression pumps once per day. The right leg still has an open wound although it is better in terms of surface area. Required debridement. A lot of pain in the posterior right Achilles marked tenderness. Usually this type of presentation this  patient gives concern for an active cellulitis 05/12/17; patient arrives today with his major wound from last week on the right lateral leg somewhat better. Still requiring debridement. He was using his compression stocking on the left leg however that is reopened with superficial wounds anteriorly he did not have an open wound on this leg previously. He is still using his juxta light's once daily at night. He cannot find the time to do this in the morning as he has to be at work by 7 AM 05/19/17; right lateral leg wound looks improved. No debridement required. The concerning area is on the left posterior leg which appears to almost have a subcutaneous hemorrhagic component to it. We've been using silver alginate to all the wounds 05/26/17; the right lateral leg wound continues to look improved. However the area on the left posterior calf is a tightly adherent surface. [place] using silver alginate. Because of the weeping edema in his legs there is very little good alternatives. 06/02/17; the patient left here last week looking quite good. Major wound on the left posterior calf and a small one on the right lateral calf. Both of these look satisfactory. He tells me that by Wednesday he had noted increased pain in the left leg and drainage. He called on Thursday and Friday to get an appointment here but we were blocked. He did not go to urgent care or his primary physician. He thinks he had a fever on Thursday but did not actually take his temperature. He has not been using his compression pumps on the left leg because of pain. I advised him to go to the emergency room today for IV antibiotics for stents of left leg cellulitis but he has refused I have asked him to take 2 days off work to keep his leg elevated and he has refused this as well. In view of this I'm going to call him and Augmentin and doxycycline. 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 However after a detailed discussion regarding the risk benefits and alternatives he refuses to get admitted to the hospital. With no other recourse I will continue him on oral antibiotics as before and hopefully he'll have his infectious disease consultation this week. 06/16/2017 -- the patient was seen today by the nurse practitioner at infectious disease Ms. Dixon. 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 He has also been advise Lotrimin cream locally. she also  had wise IV antibiotics for his condition if it flares up 06/23/17; patient arrives today with drainage bilaterally although the remaining wound on the left posterior calf after cleaning up today "highlighter yellow drainage" did not look too bad. Unfortunately he has had breakdown on the right anterior leg [previously this leg had not been open and he is using a black stocking] he went to see infectious disease and is been put on clindamycin 150 mg daily, I did not verify the dose although I'm not familiar with using clindamycin in this dosing range, perhaps for prophylaxisoo 06/27/17; I brought this patient back today to follow-up on the wound deterioration on the right lower leg together with surrounding cellulitis. I started him on doxycycline 4 days ago. This area looks better however he comes in today with intense cellulitis on the medial part of his left thigh. This is not have a wound in this area. Extremely tender. We've been using silver alginate to the wounds on the right lower leg left lower leg with bilateral 4 layer compression he is using his external  compression pumps once a day 07/04/17; patient's left medial thigh cellulitis looks better. He has not been using his compression pumps as his insert said it was contraindicated with cellulitis. His right leg continues to make improvements all the wounds are still open. We only have one remaining wound on the left posterior calf. Using silver alginate to all open areas. He is on doxycycline which I started a week ago and should be finishing I gave him Augmentin after Thursday's visit for the severe cellulitis on the left medial thigh which fortunately looks better 07/14/17; the patient's left medial thigh cellulitis has resolved. The cellulitis in his right lower calf on the right also looks better. All of his wounds are stable to improved we've been using silver alginate he has completed the antibiotics I have given him. He has clindamycin 150 mg once a day prescribed by infectious disease for prophylaxis, I've advised him to start this now. We have been using bilateral Unna boots over silver alginate to the wound areas 07/21/17; the patient is been to see infectious disease who noted his recurrent problems with cellulitis. He was not able to tolerate prophylactic clindamycin therefore he is on amoxicillin 500 twice a day. He also had a second daily dose of Lasix added By Dr. Oneta Rack but he is not taking this. Nor is he being completely compliant with his compression pumps a especially not this week. He has 2 remaining wounds one on the right posterior lateral lower leg and one on the left posterior medial lower leg. 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 The patient has Unna boots bilaterally. 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 He has bilateral Unna boots  for compression and silver alginate to his wounds. Arrives today with his legs looking as good as I have seen him in quite some time. Not surprisingly his wounds look better as well with improvement on the right lateral leg venous insufficiency wound and also the left medial leg. He is still using the compression pumps once a day 08/11/17; both legs appear to be doing better wounds on the right lateral and left medial legs look better. Skin on the right leg quite good. He is been  using silver alginate as the primary dressing. I'm going to use Anasept gel calcium alginate and maintain all the secondary dressings 08/18/17; the patient continues to actually do quite well. The area on his right lateral leg is just about closed the left medial also looks better although it is still moist in this area. His edema is well controlled we have been using Anasept gel with calcium alginate and the usual secondary dressings, 4 layer compression and once daily use of his compression pumps "always been able to manage 09/01/17; the patient continues to do reasonably well in spite of his trip to [place]. The area on the right lateral leg is epithelialized. Left is much better but still open. He has more edema and more chronic erythema on the left leg [venous inflammation] 09/08/17; he arrives today with no open wound on the right lateral leg and decently controlled edema. Unfortunately his left leg is not nearly as in his good situation as last week.he apparently had increasing edema starting on Saturday. He edema soaked through into his foot so used a plastic bag to walk around his home. The area on the medial right leg which was his open area is about the same however he has lost surface epithelium on the left lateral which is new and he has significant pain in the Achilles area of the left foot. He is already on amoxicillin chronically for prophylaxis of cellulitis in the left leg 09/15/17; he is completed a  week of doxycycline and the cellulitis in the left posterior leg and Achilles area is as usual improved. He still has a lot of edema and fluid soaking through his dressings. There is no open wound on the right leg. He saw infectious disease NP today 09/22/17;As usual 1 we transition him from our compression wraps to his stockings things did not go well. He has several small open areas on the right leg. He states this was caused by the compression wrap on his skin although he did not wear this with the stockings over them. He has several superficial areas on the left leg medially laterally posteriorly. He does not have any evidence of active cellulitis especially involving the left Achilles The patient is traveling from [facility] Saturday going to [facility]. He states he isn't attempting to get an appointment with a heel objects wound center there to change his dressings. I am not completely certain whether this will work 10/06/17; the patient came in on Friday for a nurse visit and the nurse reported that his legs actually look quite good. He arrives in clinic today for his regular follow-up visit. He has a new wound on his left third toe over the PIP probably caused by friction with his footwear. He has small areas on the left leg and a very superficial but epithelialized area on the right anterior lateral lower leg. Other than that his legs look as good as I've seen him in quite some time. We have been using silver alginate Review of systems; no chest pain no shortness of breath other than this a 10 point review of systems negative 10/20/17; seen by Dr. Meyer Russel last week. He had taken some antibiotics [doxycycline] that he had left over. Dr. Meyer Russel thought he had candida infection and declined to give him further antibiotics. He has a small wound remaining on the right lateral leg several areas on the left leg including a larger area on the left posterior several left medial and anterior and a  small wound on  the left lateral. The area on the left dorsal third toe looks a lot better. ROS; Gen.; no fever, respiratory no cough no sputum Cardiac no chest pain other than this 10 point review of system is negative 10/30/17; patient arrives today having fallen in the bathtub 3 days ago. It took him a while to get up. He has pain and maceration in the wounds on his left leg which have deteriorated. He has not been using his pumps he also has some maceration on the right lateral leg. 11/03/17; patient continues to have weeping edema especially in the left leg. This saturates his dressings which were just put on on 12/27. As usual the doxycycline seems to take care of the cellulitis on his lower leg. He is not complaining of fever, chills, or other systemic symptoms. He states his leg feels a lot better on the doxycycline I gave him empirically. He also apparently gets injections at his primary doctor's officeo Rocephin for cellulitis prophylaxis. I didn't ask him about his compression pump compliance today I think that's probably marginal. Arrives in the clinic with all of his dressings primary and secondary macerated full of fluid and he has bilateral edema 11/10/17; the patient's right leg looks some better although there is still a cluster of wounds on the right lateral. The left leg is inflamed with almost circumferential skin loss medially to laterally although we are still maintaining anteriorly. He does not have overt cellulitis there is a lot of drainage. He is not using compression pumps. We have been using silver alginate to the wound areas, there are not a lot of options here 11/17/17; the patient's right leg continues to be stable although there is still open wounds, better than last week. The inflammation in the left leg is better. Still loss of surface layer epithelium especially posteriorly. There is no overt cellulitis in the amount of edema and his left leg is really quite good,  tells me he is using his compression pumps once a day. 11/24/17; patient's right leg has a small superficial wound laterally this continues to improve. The inflammation in the left leg is still improving however we have continuous surface layer epithelial loss posteriorly. There is no overt cellulitis in the amount of edema in both legs is really quite good. He states he is using his compression pumps on the left leg once a day for 5 out of 7 days 12/01/17; very small superficial areas on the right lateral leg continue to improve. Edema control in both legs is better today. He has continued loss of surface epithelialization and left posterior calf although I think this is better. We have been using silver alginate with large number of absorptive secondary dressings 4 layer on the left Unna boot on the right at his request. He tells me he is using his compression pumps once a day 12/08/17; he has no open area on the right leg is edema control is good here. ooOn the left leg however he has marked erythema and tenderness breakdown of skin. He has what appears to be a wrap injury just distal to the popliteal fossa. 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 There was swelling erythema and breakdown in the right leg. He  has much worse skin breakdown in the left leg as well multiple open areas medially and posteriorly as well as laterally. He tells me he has been using his compression pumps but tells me he feels that the drainage out of his leg is worse when he uses a compression pumps. To be fair to him he is been saying this for a while however I don't know that I have really been listening to this. I wonder if the  compression pumps are working properly 12/22/17;. Once again he arrives with severe erythema, weeping edema from the left greater than right leg. Noncompliance with compression pumps. New this visit he is complaining of pain on the lateral aspect of the right leg and the medial aspect of his right thigh. He apparently saw his cardiologist Dr. [name] who was ordered an echocardiogram area and I think this is a step in the right direction 12/25/17; started his doxycycline Monday night. There is still intense erythema of the right leg especially in the anterior thigh although there is less tenderness. The erythema around the wound on the right lateral calf also is less tender. He still complaining of pain in the left heel. His wounds are about the same right lateral left medial left lateral. Superficial but certainly not close to closure. He denies being systemically unwell no fever chills no abdominal pain no diarrhea 12/29/17; back in follow-up of his extensive right calf and right thigh cellulitis. I added amoxicillin to cover possible doxycycline resistant strep. This seems to of done the trick he is in much less pain there is much less erythema and swelling. He has his echocardiogram at 11:00 this morning. X-ray of the left heel was also negative. 01/05/18; the patient arrived with his edema under much better control. Now that he is retired he is able to use his compression pumps daily and sometimes twice a day per the patient. He has a wound on the right leg the lateral wound looks better. Area on the left leg also looks a lot better. He has no evidence of cellulitis in his bilateral thighs I had a quick peak at his echocardiogram. He is in normal ejection fraction and normal left ventricular function. He has moderate pulmonary hypertension moderately reduced right ventricular function. One would have to wonder about chronic sleep apnea although he says he doesn't snore. He'll review the  echocardiogram with his cardiologist. 01/12/18; the patient arrives with the edema in both legs under exemplary control. He is using his compression pumps daily and sometimes twice daily. His wound on the right lateral leg is just about closed. He still has some weeping areas on the posterior left calf and lateral left calf although everything is just about closed here as well. I have spoken with Aldean Baker who is the patient's nurse practitioner and infectious disease. She was concerned that the patient had not understood that the parenteral penicillin injections he was receiving for cellulitis prophylaxis was actually benefiting him. I don't think the patient actually saw that I would tend to agree we were certainly dealing with less infections although he had a serious one last month. 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 He is healed. He'll be placed in juxtalite compression wraps and increase his lymphedema pumps to twice daily. We will follow up again next week to ensure there are no issues with the new regiment. 01/20/18-he is here for evaluation of bilateral lower extremity weeping edema. Yesterday he was placed in  compression wrap to the right lower extremity and compression stocking to left lower shrubbery. He states he uses lymphedema pumps last night and again this morning and noted a blister to the left lower extremity. On exam he was noted to have drainage to the right lower extremity. He will be placed in Unna boots bilaterally and follow-up next week 01/26/18; patient was actually discharged a week ago to his own juxta light stockings only to return the next day with bilateral lower extremity weeping edema.he was placed in bilateral Unna boots. He arrives today with pain in the back of his left leg. There is no open area on the right leg however there is a linear/wrap injury on the left leg and weeping edema on the left leg posteriorly. I  spoke with infectious disease about 10 days ago. They were disappointed that the patient elected to discontinue prophylactic intramuscular penicillin shots as they felt it was particularly beneficial in reducing the frequency of his cellulitis. I discussed this with the patient today. He does not share this view. He'll definitely need antibiotics today. Finally he is traveling to [place] and trauma leaving this Saturday and returning a week later and he does not travel with his pumps. He is going by car 01/30/18; patient was seen 4 days ago and brought back in today for review of cellulitis in the left leg posteriorly. I put him on amoxicillin this really hasn't helped as much as I might like. He is also worried because he is traveling to [facility] trauma by car. Finally we will be rewrapping him. There is no open area on the right leg over his left leg has multiple weeping areas as usual 02/09/18; The same wrap on for 10 days. He did not pick up the last doxycycline I prescribed for him. He apparently took 4 days worth he already had. There is nothing open on his right leg and the edema control is really quite good. He's had damage in the left leg medially and laterally especially probably related to the prolonged use of Unna boots 02/12/18; the patient arrived in clinic today for a nurse visit/wrap change. He complained of a lot of pain in the left posterior calf. He is taking doxycycline that I previously prescribed for him. Unfortunately even though he used his stockings and apparently used to compression pumps twice a day he has weeping edema coming out of the lateral part of his right leg. This is coming from the lower anterior lateral skin area. 02/16/18; the patient has finished his doxycycline and will finish the amoxicillin 2 days. The area of cellulitis in the left calf posteriorly has resolved. He is no longer having any pain. He tells me he is using his compression pumps at least once a  day sometimes twice. 02/23/18; the patient finished his doxycycline and Amoxil last week. On Friday he noticed a small erythematous circle about the size of a quarter on the left lower leg just above his ankle. This rapidly expanded and he now has erythema on the lateral and posterior part of the thigh. This is bright red. Also has an area on the dorsal foot just above his toes and a tender area just below the left popliteal fossa. He came off his prophylactic penicillin injections at his own insistence one or 2 months ago. This is obviously deteriorated since then 03/02/18; patient is on doxycycline and Amoxil. Culture I did last week of the weeping area on the back of his left calf grew group  B strep. I have therefore renewed the amoxicillin 500 3 times a day for a further week. He has not been systemically unwell. Still complaining of an area of discomfort right under his left popliteal fossa. There is no open wound on the right leg. He tells me that he is using his pumps twice a day on most days 03/09/18; patient arrives in clinic today completing his amoxicillin today. The cellulitis on his left leg is better. Furthermore he tells me that he had intramuscular penicillin shots that his primary care office today. However he also states that the wrap on his right leg fell down shortly after leaving clinic last week. He developed a large blister that was present when he came in for a nurse visit later in the week and then he developed intense discomfort around this area.He tells me he is using his compression pumps 03/16/18; the patient has completed his doxycycline. The infectious part of this/cellulitis in the left heel area left popliteal area is a lot better. He has 2 open areas on the right calf. Still areas on the left calf but this is a lot better as well. 03/24/18; the patient arrives complaining of pain in the left popliteal area again. He thinks some of this is wrap injury. He has no open  area on the right leg and really no open area on the left calf either except for the popliteal area. He claims to be compliant with the compression pumps 03/31/18; I gave him doxycycline last week because of cellulitis in the left popliteal area. This is a lot better although the surface epithelium is denuded off and response to this. He arrives today with uncontrolled edema in the right calf area as well as a fingernail injury in the right lateral calf. There is only a few open areas on the left 04/06/18; I gave him amoxicillin doxycycline over the last 2 [name] that the amoxicillin should be completing currently. He is not complaining of any pain or systemic symptoms. The only open areas see has is on the right lateral lower leg paradoxically I cannot see anything on the left lower leg. He tells me he is using his compression pumps twice a day on most days. Silver alginate to the wounds that are open under 4 layer compression 04/13/18; he completed antibiotics and has no new complaints. Using his compression pumps. Silver alginate that anything that's opened 04/20/18; he is using his compression pumps religiously. Silver alginate 4 layer compression anything that's opened. He comes in today with no open wounds on the left leg but 3 on the right including a new one posteriorly. He has 2 on the right lateral and one on the right posterior. He likes Unna boots on the right leg for reasons that aren't really clear we had the usual 4 layer compression on the left. It may be necessary to move to the 4 layer compression on the right however for now I left them in the Unna boots 04/27/18; he is using his compression pumps at least once a day. He has still the wounds on the right lateral calf. The area right posteriorly has closed. He does not have an open wound on the left under 4 layer compression however on the dorsal left foot just proximal to the toes and the left third toe 2 small open areas were  identified 05/11/18; he has not uses compression pumps. The areas on the right lateral calf have coalesced into one large wound necrotic surface. On the  left side he has one small wound anteriorly however the edema is now weeping out of a large part of his left leg. He says he wasn't using his pumps because of the weeping fluid. I explained to him that this is the time he needs to pump more 05/18/18; patient states he is using his compression pumps twice a day. The area on the right lateral large wound albeit superficial. On the left side he has innumerable number of small new wounds on the left calf particularly laterally but several anteriorly and medially. All these appear to have healthy granulated base these look like the remnants of blisters however they occurred under compression. The patient arrives in clinic today with his legs somewhat better. There is certainly less edema, less multiple open areas on the left calf and the right anterior leg looks somewhat better as well superficial and a little smaller. However he relates pain and erythema over the last 3-4 days in the thigh and I looked at this today. He has not been systemically unwell no fever no chills no change in blood sugar values 05/25/18; comes in today in a better state. The severe cellulitis on his left leg seems better with the Keflex. Not as tender. He has not been systemically unwell ooHard to find an open wound on the left lower leg using his compression pumps twice a day ooThe confluent wounds on his right lateral calf somewhat better looking. These will ultimately need debridement I didn't do this today. 06/01/18; the severe cellulitis on the left anterior thigh has resolved and he is completed his Keflex. ooThere is no open wound on the left leg however there is a superficial excoriation at the base of the third toe dorsally. Skin on the bottom of his left foot is macerated looking. ooThe left the wounds on the  lateral right leg actually looks some better although he did require debridement of the top half of this wound area with an open curet 06/09/18 on evaluation today patient appears to be doing poorly in regard to his right lower extremity in particular this appears to likely be infected he has very thick purulent discharge along with a bright green tent to the discharge. This makes me concerned about the possibility of pseudomonas. He's also having increased discomfort at this point on evaluation. Fortunately there does not appear to be any evidence of infection spreading to the other location at this time. 06/16/18 on evaluation today patient appears to actually be doing fairly well. His ulcer has actually diminished in size quite significantly at this point which is good news. Nonetheless he still does have some evidence of infection he did see infectious disease this morning before coming here for his appointment. I did review the results of their evaluation and their note today. They did actually have him discontinue the Cipro and initiate treatment with linezolid at this time. He is doing this for the next seven days and they recommended a follow-up in four months with them. He is the keep a log of the need for intermittent antibiotic therapy between now and when he falls back up with infectious disease. This will help them gaze what exactly they need to do to try and help them out. 06/23/18; the patient arrives today with no open wounds on the left leg and left third toe healed. He is been using his compression pumps twice a day. On the right lateral leg he still has a sizable wound but this is a lot better  than last time I saw this. 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 Has been using silver alginate under 4 layer compression 06/30/18; the only open wound he has is on the right lateral  leg and this looks healthy. No debridement is required. We have been using silver alginate. He does not have an open wound on the left leg. There is apparently some drainage from the dorsal proximal third toe on the left although I see no open wound here. 07/03/18 on evaluation today patient was actually here just for a nurse visit rapid change. However when he was here on Wednesday for his rat change due to having been healed on the left and then developing blisters we initiated the wrap again knowing that he would be back today for [place] to reevaluate and see were at. Unfortunately he has developed some cellulitis into the proximal portion of his right lower extremity even into the region of his thigh. 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 He was placed on one as what at that point. Nonetheless he is done with that and has been tolerating it well otherwise. Doxycycline which in the past really did not seem to be effective for him. Nonetheless I think the best option may be for [place] to definitely reinitiate the antibiotics for a longer period of time. 07/07/18; since I last saw this patient a week ago he has had a difficult time. At that point he did not have an open wound on his left leg. We transitioned him into juxta light stockings. He was apparently in the clinic the next day with blisters on the left lateral and left medial lower calf. He also had weeping edema fluid. He was put back into a compression wrap. He was also in the clinic on Friday with intense erythema in his right thigh. Per the patient he was started on Bactrim however that didn't work at all in terms of relieving his pain and swelling. He has taken 3 doxycycline that he had left over from last time and that seems to of helped. He has blistering on the right thigh as well. 07/14/18; the erythema on his right thigh has gotten better with doxycycline that he is finishing. The culture that I  did of a blister on the right lateral calf just below his knee grew MRSA resistant to doxycycline. Presumably this cellulitis in the thigh was not related to that although I think this is a bit concerning going forward. He still has an area on the right lateral calf the blister on the right medial calf just below the knee that was discussed above. On the left 2 small open areas left medial and left lateral. Edema control is adequate. He is using his compression pumps twice a day 07/20/18; continued improvement in the condition of both legs especially the edema in his bilateral thighs. He tells me he is been losing weight through a combination of diet and exercise. He is using his compression pumps twice a day. So overall she made to the remaining wounds 07/27/2018; continued improvement in condition of both legs. His edema is well controlled. The area on the right lateral leg is just about closed he had one blisters show up on the medial left upper calf. We have him in 4 layer compression. He is going on a 10-day trip to [place]  [place], [place] and [place]. He will be driving. He wants to wear Unna boots because of the lessening amount of constriction. He will not use compression pumps while he is away 08/05/18 on evaluation today patient actually appears to be doing decently well all things considered in regard to his bilateral lower extremities. The worst ulcer is actually only posterior aspect of his left lower extremity with a four layer compression wrap cut into his leg a couple [name] back. He did have a trip and actually had [employer] for the trip that he is worn since he was last here. Nonetheless he feels like the [employer] actually do better for him his swelling is up a little bit but he also with his trip was not taking his Lasix on a regular set schedule like he was supposed to be. He states that obviously the reason being that he cannot drive and keep going without having to  urinate too frequently which makes it difficult. He did not have his pumps with him while he was away either which I think also maybe playing a role here too. 08/13/2018; the patient only has a small open wound on the right lateral calf which is a big improvement in the last month or 2. He also has the area posteriorly just below the posterior fossa on the left which I think was a wrap injury from several [name] ago. He has no current evidence of cellulitis. He tells me he is back into his compression pumps twice a day. He also tells me that while he was at the laundromat somebody stole a section of his extremitease stockings 08/20/2018; back in the clinic with a much improved state. He only has small areas on the right lateral mid calf which is just about healed. This was is more substantial area for quite a prolonged period of time. He has a small open area on the left anterior tibia. The area on the posterior calf just below the popliteal fossa is closed today. He is using his compression pumps twice a day 08/28/2018; patient has no open wound on the right leg. He has a smattering of open areas on the calf with some weeping lymphedema. More problematically than that it looks as though his wraps of slipped down in his usual he has very angry upper area of edema just below the right medial knee and on the right lateral calf. He has no open area on his feet. The patient is traveling to [facility] next week. I will send him in an antibiotic. We will continue to wrap the right leg. We ordered extremitease stockings for him last week and I plan to transition the right leg to a stocking when he gets home which will be in 10 days time. As usual he is very reluctant to take his pumps with him when he travels 09/07/2018; patient returns from [facility]. He shows me a picture of his left leg in the mid part of his trip last week with intense fire engine erythema. The picture look bad enough I would have  considered sending him to the hospital. Instead he went to the wound care center in [facility]. They did not prescribe him antibiotics but he did take some doxycycline he had leftover from a previous visit. I had given him trimethoprim sulfamethoxazole before he left this did not work according to the patient. This is resulted in some improvement fortunately. He comes back with a large wound on the  left posterior calf. Smaller area on the left anterior tibia. Denuded blisters on the dorsal left foot over his toes. Does not have much in the way of wounds on the right leg although he does have a very tender area on the right posterior area just below the popliteal fossa also suggestive of infection. He promises me he is back on his pumps twice a day 09/15/2018; the intense cellulitis in his left lower calf is a lot better. The wound area on the posterior left calf is also so better. However he has reasonably extensive wounds on the dorsal aspect of his second and third toes and the proximal foot just at the base of the toes. There is nothing open on the right leg 09/22/2018; the patient has excellent edema control in his legs bilaterally. He is using his external compression pumps twice a day. He has no open area on the right leg and only the areas in the left foot dorsally second and third toe area on the left side. He does not have any signs of active cellulitis. 10/06/2018; the patient has good edema control bilaterally. He has no open wound on the right leg. There is a blister in the posterior aspect of his left calf that we had to deal with today. He is using his compression pumps twice a day. There is no signs of active cellulitis. We have been using silver alginate to the wound areas. He still has vulnerable areas on the base of his left first second toes dorsally He has a his extremities stockings and we are going to transition him today into the stocking on the right leg. He  is cautioned that he will need to continue to use the compression pumps twice a day. If he notices uncontrolled edema in the right leg he may need to go to 3 times a day. 10/13/2018; the patient came in for a nurse check on Friday he has a large flaccid blister on the right medial calf just below the knee. We unroofed this. He has this and a new area underneath the posterior mid calf which was undoubtedly a blister as well. He also has several small areas on the right which is the area we put his extremities stocking on. 10/19/2018; the patient went to see infectious disease this morning I am not sure if that was a routine follow-up in any case the doxycycline I had given him was discontinued and started on linezolid. He has not started this. It is easy to look at his left calf and the inflammation and think this is cellulitis however he is very tender in the tissue just below the popliteal fossa and I have no doubt that there is infection going on here. He states the problem he is having is that with the compression pumps the edema goes down and then starts walking the wrap falls down. We will see if we can adhere this. He has 1 or 2 minuscule open areas on the right still areas that are weeping on the posterior left calf, the base of his left second and third toes 10/26/18; back today in clinic with quite of skin breakdown in his left anterior leg. This may have been infection the area below the popliteal fossa seems a lot better however tremendous epithelial loss on the left anterior mid tibia area over quite inexpensive tissue. He has 2 blisters on the right side but no other open wound here. 10/29/2018; came in urgently to see [place] today and we  worked him in for review. He states that the 4 layer compression on the right leg caused pain he had to cut it down to roughly his mid calf this caused swelling above the wrap and he has blisters and skin breakdown today. As a result of the pain he has  not been using his pumps. Both legs are a lot more edematous and there is a lot of weeping fluid. 11/02/18; arrives in clinic with continued difficulties in the right leg> left. Leg is swollen and painful. multiple skin blisters and new open areas especially laterally. He has not been using his pumps on the right leg. 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 He is not systemically unwell. 11/09/2018; the patient claims he is being compliant with his pumps. He is finished the doxycycline I gave him last week. Culture I did of the wound on the right lateral leg showed a few very resistant methicillin staph aureus. This was resistant to doxycycline. Nevertheless he states the pain in the leg is a lot better which makes me wonder if the cultured organism was not really what was causing the problem nevertheless this is a very dangerous organism to be culturing out of any wound. His right leg is still a lot larger than the left. He is using an Radio broadcast assistant on this area, he blames a 4-layer compression for causing the original skin breakdown which I doubt is true however I cannot talk him out of it. We have been using silver alginate to all of these areas which were initially blisters 11/16/2018; patient is being compliant with his external compression pumps at twice a day. Miraculously he arrives in clinic today with absolutely no open wounds. He has better edema control on the left where he has been using 4 layer compression versus wound of wounds on the right and I pointed this out to him. There is no inflammation in the skin in his lower legs which is also somewhat unusual for him. There is no open wounds on the dorsal left foot. He has extremitease stockings at home and I have asked him to bring these in next week. 11/25/18 patient's lower extremity on examination today on the left appears for the most part to be wound free. He does have an open wound on the  lateral aspect of the right lower extremity but this is minimal compared to what I've seen in past. He does request that we go ahead and wrap the left leg as well even though there's nothing open just so hopefully it will not reopen in short order. 1/28; patient has superficial open wounds on the right lateral calf left anterior calf and left posterior calf. His edema control is adequate. 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 We have been using silver alginate as the primary dressing. He claims compliance with his compression pumps 2/4; patient has superficial open wounds on numerous areas of his left calf and again one on the left dorsal foot. The areas on the right lateral calf have healed. The cellulitis that I gave him doxycycline for last week is also resolved this was mostly on the left anterior calf just below the tibial tuberosity. His edema looks fairly well-controlled. He tells me he went to see his primary doctor today and had blood work ordered 2/11; once again he has several open areas on the left calf  left tibial area. Most of these are small and appear to have healthy granulation. He does not have anything open on the right. The edema and control in his thighs is pretty good which is usually a good indication he has been using his pumps as requested. 2/18; he continues to have several small areas on the left calf and left tibial area. Most of these are small healthy granulation. We put him in his stocking on the right leg last week and he arrives with a superficial open area over the right upper tibia and a fairly large area on the right lateral tibia in similar condition. His edema control actually does not look too bad, he claims to be using his compression pumps twice a day 2/25. Continued small areas on the left calf and left tibial area. New areas especially on the right are identified  just below the tibial tuberosity and on the right upper tibia itself. There are also areas of weeping edema fluid even without an obvious wound. He does not have a considerable degree of lymphedema but clearly there is more edema here than his skin can handle. He states he is using the pumps twice a day. We have an Unna boot on the right and 4 layer compression on the left. 3/3; he continues to have an area on the right lateral calf and right posterior calf just below the popliteal fossa. There is a fair amount of tenderness around the wound on the popliteal fossa but I did not see any evidence of cellulitis, could just be that the wrap came down and rubbed in this area. ooHe does not have an open area on the left leg however there is an area on the left dorsal foot at the base of the third toe ooWe have been using silver alginate to all wound areas 3/10; he did not have an open area on his left leg last time he was here a week ago. Today he arrives with a horizontal wound just below the tibial tuberosity and an area on the left lateral calf. He has intense erythema and tenderness in this area. The area is on the right lateral calf and right posterior calf better than last week. We have been using silver alginate as usual 3/18 - Patient returns with 3 small open areas on left calf, and 1 small open area on right calf, the skin looks ok with no significant erythema, he continues the UNA boot on right and 4 layer compression on left. The right lateral calf wound is closed , the right posterior is small area. we will continue silver alginate to the areas. 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 He is been tolerating the dressing changes without complication. Fortunately she has made excellent  improvement in regard to the overall status of his wounds. Unfortunately every time we cease wrapping him he ends up reopening in causing more significant issues at that point. Again I'm unsure of the best direction to take although I think the lymphedema clinic may be appropriate for him. 02/03/19 on evaluation today patient appears to be doing well in regard to the wounds that we saw him for last week unfortunately he has a new area on the proximal portion of his right medial/posterior lower extremity where the wrap somewhat slowed down and caused  swelling and a blister to rub and open. Unfortunately this is the only opening that he has on either leg at this point. 02/17/19 on evaluation today patient's bilateral lower extremities appear to be doing well. He still completely healed in regard to the left lower extremity. In regard to the right lower extremity the area where the wrap and slid down and caused the blister still seems to be slightly open although this is dramatically better than during the last evaluation two [name] ago. I'm very pleased with the way this stands overall. 03/03/19 on evaluation today patient appears to be doing well in regard to his right lower extremity in general although he did have a new blister open this does not appear to be showing any evidence of active infection at this time. Fortunately there's No fevers, chills, nausea, or vomiting noted at this time. Overall I feel like he is making good progress it does feel like that the right leg will we perform the [employer] seems to do with a bit better than three layer wrap on the left which slid down on him. We may switch to doing bilateral in the book wraps. 5/4; I have not seen Mr. Plyler in quite some time. According to our case manager he did not have an open wound on his left leg last week. He had 1 remaining wound on the right posterior medial calf. He arrives today with multiple openings on the left leg probably  were blisters and/or wrap injuries from Unna boots. I do not think the Unna boot's will provide adequate compression on the left. I am also not clear about the frequency he is using the compression pumps. 03/17/19 on evaluation today patient appears to be doing excellent in regard to his lower extremities compared to last week's evaluation apparently. He had gotten significantly worse last week which is unfortunate. The [employer] wrap on the left did not seem to do very well for him at all and in fact it didn't control his swelling significantly enough he had an additional outbreak. Subsequently we go back to the four layer compression wrap on the left. This is good news. At least in that he is doing better and the wound seem to be killing him. He still has not heard anything from the lymphedema clinic. 03/24/19 on evaluation today patient actually appears to be doing much better in regard to his bilateral lower Trinity as compared to last week when I saw him. Fortunately there's no signs of active infection at this time. He has been tolerating the dressing changes without complication. Overall I'm extremely pleased with the progress and appearance in general. 04/07/19 on evaluation today patient appears to be doing well in regard to his bilateral lower extremities. His swelling is significantly down from where it was previous. With that being said he does have a couple blisters still open at this point but fortunately nothing that seems to be too severe and again the majority of the larger openings has healed at this time. 04/14/19 on evaluation today patient actually appears to be doing quite well in regard to his bilateral lower extremities in fact I'm not even sure there's anything significantly open at this time at any site. Nonetheless he did have some trouble with these wraps where they are somewhat irritating him secondary to the fact that he has noted that the graph wasn't too close down to  the end of this foot in a little bit short as well up to his  knee. Otherwise things seem to be doing quite well. 04/21/19 upon evaluation today patient's wound bed actually showed evidence of being completely healed in regard to both lower extremities which is excellent news. There does not appear to be any signs of active infection which is also good news. I'm very pleased in this regard. No fevers, chills, nausea, or vomiting noted at this time. 04/28/19 on evaluation today patient appears to be doing a little bit worse in regard to both lower extremities on the left mainly due to the fact that when he went infection disease the wrap was not wrapped quite high enough he developed a blister above this. On the right he is a small open area of nothing too significant but again this is continuing to give him some trouble he has been were in the Velcro compression that he has at home. 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 He's been tolerating the dressing changes without complication. Fortunately there's no signs of active infection at this time. No fevers, chills, nausea, or vomiting noted at this time. We have been trying to get an appointment with her lymphedema clinic in [facility] but unfortunately nobody can get them on phone with not been able to even fax information over the patient likewise is not been able to get in touch with them. Overall I'm not sure exactly what's going on here with to reach out again today. 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 Still having a lot of drainage unfortunately. He tells me especially in the left but even on the right. There's no signs of active infection which is good news we've been using so ratcheted up to this point. 05/19/19 on evaluation today patient actually appears to be doing quite  well with regard to his left lower extremity which is great news. Fortunately in regard to the right lower extremity has an issues with his wrap and he subsequently did remove this from what I'm understanding. Nonetheless long story short is what he had rewrapped once he removed it subsequently had maggots underneath this wrap whenever he came in for evaluation today. With that being said they were obviously completely cleaned away by the nursing staff. The visit today which is excellent news. However he does appear to potentially have some infection around the right ankle region where the maggots were located as well. He will likely require anabiotic therapy today. 05/26/19 on evaluation today patient actually appears to be doing much better in regard to his bilateral lower extremities. I feel like the infection is under much better control. With that being said there were maggots noted when the wrap was removed yet again today. Again this could have potentially been left over from previous although at this time there does not appear to be any signs of significant drainage there was obviously on the wrap some drainage as well this contracted gnats or otherwise. Either way I do not see anything that appears to be doing worse in my pinion and in fact I think his drainage has slowed down quite significantly likely mainly due to the fact to his infection being under better control. 06/02/2019 on evaluation today patient actually appears to be doing well with regard to his bilateral lower extremities there is no signs of active infection at this time which is great news. With that being said he does  have several open areas more so on the right than the left but nonetheless these are all significantly better than previously noted. 06/09/2019 on evaluation today patient actually appears to be doing well. His wrap stayed up and he did not cause any problems he had more drainage on the right compared to the  left but overall I do not see any major issues at this time which is great news. 06/16/2019 on evaluation today patient appears to be doing excellent with regard to his lower extremities the only area that is open is a new blister that can have opened as of today on the medial ankle on the left. Other than this he really seems to be doing great I see no major issues at this point. 06/23/2019 on evaluation today patient appears to be doing quite well with regard to his bilateral lower extremities. In fact he actually appears to be almost completely healed there is a small area of weeping noted of the right lower extremity just above the ankle. Nonetheless fortunately there is no signs of active infection at this time which is good news. No fevers, chills, nausea, vomiting, or diarrhea. 8/24; the patient arrived for a nurse visit today but complained of very significant pain in the left leg and therefore I was asked to look at this. Noted that he did not have an open area on the left leg last week nevertheless this was wrapped. The patient states that he is not been able to put his compression pumps on the left leg because of the discomfort. He has not been systemically unwell 06/30/2019 on evaluation today patient unfortunately despite being excellent last week is doing much worse with regard to his left lower extremity today. In fact he had to come in for a nurse on Monday where his left leg had to be rewrapped due to excessive weeping Dr. Leanord Hawking placed him on doxycycline at that point. Fortunately there is no signs of active infection Systemically at this time which is good news. 07/07/2019 in regard to the patient's wounds today he actually seems to be doing well with his right lower extremity there really is nothing open or draining at this point this is great news. Unfortunately the left lower extremity is given him additional trouble at this time. There does not appear to be any signs of active  infection nonetheless he does have a lot of edema and swelling noted at this point as well as blistering all of which has led to a much more poor appearing leg at this time compared to where it was 2 [name] ago when it was almost completely healed. Obviously this is a little discouraging for the patient. He is try to contact the lymphedema clinic in [place] he has not been able to get through to them. 07/14/2019 on evaluation today patient actually appears to be doing slightly better with regard to his left lower extremity ulcers. Overall I do feel like at least at the top of the wrap that we have been placing this area has healed quite nicely and looks much better. The remainder of the leg is showing signs of improvement. Unfortunately in the thigh area he still has an open region on the left and again on the right he has been utilizing just a Band-Aid on an area that also opened on the thigh. Again this is an area that were not able to wrap although we did do an Ace wrap to provide some compression that something that obviously is  a little less effective than the compression wraps we have been using on the lower portion of the leg. He does have an appointment with the lymphedema clinic in [facility] on Friday. 07/21/2019 on evaluation today patient appears to be doing better with regard to his lower extremity ulcers. He has been tolerating the dressing changes without complication. Fortunately there is no signs of active infection at this time. No fevers, chills, nausea, vomiting, or diarrhea. I did receive the paperwork from the physical therapist at the lymphedema clinic in [place]. Subsequently I signed off on that this morning and sent that back to him for further progression with the treatment plan. 07/28/2019 on evaluation today patient appears to be doing very well with regard to his right lower extremity where I do not see any open wounds at this point. Fortunately he is feeling  great as far as that is concerned as well. In regard to the left lower extremity he has been having issues with still several areas of weeping and edema although the upper leg is doing better his lower leg still I think is going require the compression wrap at this time. No fevers, chills, nausea, vomiting, or diarrhea. 08/04/2019 on evaluation today patient unfortunately is having new wounds on the right lower extremity. Again we have been using Unna boot wrap on that side. We switched him to using his juxta lite wrap at home. With that being said he tells me he has been using it although his legs extremely swollen and to be honest really does not appear that he has been. I cannot know that for sure however. Nonetheless he has multiple new wounds on the right lower extremity at this time. Obviously we will have to see about getting this rewrapped for him today. 08/11/2019 on evaluation today patient appears to be doing fairly well with regard to his wounds. He has been tolerating the dressing changes including the compression wraps without complication. He still has a lot of edema in his upper thigh regions bilaterally he is supposed to be seeing the lymphedema clinic on the 15th of this month once his wraps arrive for the upper part of his legs. 08/18/2019 on evaluation today patient appears to be doing well with regard to his bilateral lower extremities at this point. He has been tolerating the dressing changes without complication. Fortunately there is no signs of active infection which is also good news. He does have a couple weeping areas on the first and second toe of the right foot he also has just a small area on the left foot upper leg and a small area on the left lower leg but overall he is doing quite well in my opinion. He is supposed to be getting his wraps shortly in fact tomorrow and then subsequently is seeing the lymphedema clinic next Wednesday on the 21st. Of note he is also  leaving on the 25th to go on vacation for a week to the beach. For that reason and since there is some uncertainty about what there can be doing at lymphedema clinic next Wednesday I am get a make an appointment for next Friday here for [place] to see what we need to do for him prior to him leaving for vacation. 10/23; patient arrives in considerable pain predominantly in the upper posterior calf just distal to the popliteal fossa also in the wound anteriorly above the major wound. This is probably cellulitis and he has had this recurrently in the past. He has no open  wound on the right side and he has had an Radio broadcast assistant in that area. Finally I note that he has an area on the left posterior calf which by enlarge is mostly epithelialized. This protrudes beyond the borders of the surrounding skin in the setting of dry scaly skin and lymphedema. The patient is leaving [facility] [place] [facility] [facility] Per his longstanding pattern, he will not take his compression pumps with him predominantly out of fear that they will be stolen. He therefore asked that we put a Unna boot back on the right leg. He will also contact the wound care center in [facility] to see if they can change his dressing in the mid week. Objective Constitutional Patient is hypertensive.. Pulse regular and within target range for patient.Marland Kitchen Respirations regular, non-labored and within target range.. Temperature is normal and within the target range for the patient.Marland Kitchen Appears in no distress. Vitals Time Taken: 10:39 AM, Height: 70 in, Weight: 380.2 lbs, BMI: 54.5, Temperature: 98.3 F, Pulse: 54 bpm, Respiratory Rate: 18 breaths/min, Blood Pressure: 174/54 mmHg. Respiratory work of breathing is normal. Bilateral breath sounds are clear and equal in all lobes with no wheezes, rales or rhonchi.. Cardiovascular Heart sounds are regular no murmurs JVP or not elevated. He looks euvolemic. Psychiatric appears at normal baseline. General Notes:  Wound exam; right leg is no open area. Considerable deterioration in the left lower extremity with erythema and tenderness especially just below the knee anteriorly and posteriorly. Large open wounds anteriorly and laterally. Integumentary (Hair, Skin) Intense erythema in the left lower extremity. Large open areas anteriorly and laterally. There is tenderness especially posteriorly suggestive of cellulitis.. Wound #132R status is Open. Original cause of wound was Blister. The wound is located on the Left,Medial Upper Leg. The wound measures 1cm length x 3cm width x 0.1cm depth; 2.356cm^2 area and 0.236cm^3 volume. There is no tunneling or undermining noted. There is a medium amount of serosanguineous drainage noted. The wound margin is distinct with the outline attached to the wound base. There is medium (34-66%) pink, pale granulation within the wound bed. There is a medium (34-66%) amount of necrotic tissue within the wound bed including Adherent Slough. Wound #137 status is Open. Original cause of wound was Blister. The wound is located on the Right,Proximal,Medial Lower Leg. The wound measures 0cm length x 0cm width x 0cm depth; 0cm^2 area and 0cm^3 volume. Wound #138 status is Open. Original cause of wound was Gradually Appeared. The wound is located on the Right,Anterior Toe Second. The wound measures 0cm length x 0cm width x 0cm depth; 0cm^2 area and 0cm^3 volume. Wound #139 status is Open. Original cause of wound was Blister. The wound is located on the Left,Anterior Upper Leg. The wound measures 0cm length x 0cm width x 0cm depth; 0cm^2 area and 0cm^3 volume. Wound #140 status is Open. Original cause of wound was Blister. The wound is located on the Left,Circumferential Lower Leg. The wound measures 12.5cm length x 38cm width x 0.1cm depth; 373.064cm^2 area and 37.306cm^3 volume. There is no tunneling or undermining noted. There is a large amount of serosanguineous drainage  noted. Foul odor after cleansing was noted. The wound margin is distinct with the outline attached to the wound base. There is large (67-100%) red, pink granulation within the wound bed. There is a small (1-33%) amount of necrotic tissue within the wound bed including Adherent Slough. 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 There was a Four Layer Compression Therapy Procedure by Shawn Stall, RN. Post procedure Diagnosis Wound #132R: Same as Pre-Procedure Wound #140 Pre-procedure diagnosis of Wound #140 is a Lymphedema located on the Left,Circumferential Lower Leg . There was a Four Layer Compression Therapy Procedure by Shawn Stall, RN. Post procedure Diagnosis Wound #140: Same as Pre-Procedure There was a Radio broadcast assistant Compression Therapy Procedure by Shawn Stall, RN. Post procedure Diagnosis Wound #: Same as Pre-Procedure Plan Follow-up Appointments: Return Appointment in: - when you return from being out of town. Dressing Change Frequency: Do not change entire dressing for one week. - both legs Skin Barriers/Peri-Wound Care: TCA Cream or Ointment - mix with lotion both legs Wound Cleansing: May shower with protection. Primary Wound Dressing: Wound #132R Left,Medial Upper Leg: Calcium Alginate with Silver Wound #140 Left,Circumferential Lower Leg: Calcium Alginate with Silver Secondary Dressing: Wound #132R Left,Medial Upper Leg: Foam Border Wound #140  Left,Circumferential Lower Leg: Dry Gauze ABD pad Kerramax Carboflex Edema Control: 4 layer compression: Left lower extremity - unna boot at upper portion of lower leg. Unna Boot to Right Lower Extremity Avoid standing for long periods of time Elevate legs to the level of the heart or above for 30 minutes daily and/or when sitting, a frequency of: Exercise regularly Segmental Compressive Device. - lymphadema pumps 60 min 2 times per day Additional Orders / Instructions: Other: - start oral antibiotics and complete all antibiotics. The following medication(s) was prescribed: doxycycline monohydrate oral 100 mg capsule 1 capsule oral bid for 10 days for cellulitis left leg starting 08/27/2019 1. The usual dressings to the large area on the left leg. 2. In spite of our request he will not bring compression pumps on vacation 3. Doxycycline 100 twice daily for 10 days 4. I think it would be reasonable to go to see the wound care clinic in [facility] if he can get in. 5. Unna boot to the right leg 6. The appearance of the area on the left posterior thigh is a bit concerning. Although it appears to be mostly epithelialized it is raised somewhat firm. If this continues to enlarge it may require a biopsy [o Sarcoma). This has been described in the setting of chronic poorly controlled lymphedema Electronic Signature(s) Signed: 08/27/2019 1:03:55 PM By: Baltazar Najjar MD Entered By: Baltazar Najjar on 08/27/2019 13:03:55 -------------------------------------------------------------------------------- SuperBill Details Patient Name: Date of Service: [name], [name] 08/27/2019 Medical Record [id number] Patient Account Number: [id number] Date of Birth/Sex: Treating RN: 1951/06/23 (68 y.o. [name] Primary Care Provider: Nicoletta [name] Other Clinician: Referring Provider: Treating Provider/Extender:[name], [name], [name] [name] in Treatment: 187 Diagnosis  Coding ICD-10 Codes Code Description 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 skin L97.221 Non-pressure chronic ulcer of left calf limited to breakdown of skin 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 I89.0 Lymphedema, not elsewhere classified E11.622 Type 2 diabetes mellitus with other skin ulcer 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, unspecified L03.116 Cellulitis of left lower limb Facility Procedures CPT4 Code Description: [id number] (Facility Use Only) [phone] - APPLY MULTLAY COMPRS LWR  LT LEG Modifier: Quantity: 1 CPT4 Code Description: [id number] (Facility Use Only) [phone] - APPLY Roland Rack BOOT RT Modifier: 59 Quantity: 1 Physician Procedures CPT4 Code Description: 2130865 78469 - WC PHYS LEVEL 4 - EST PT ICD-10 Diagnosis Description I89.0 Lymphedema, not elsewhere classified L03.116 Cellulitis of left lower limb L97.221 Non-pressure chronic ulcer of left calf limited to br Modifier: eakdown of skin Quantity: 1 Electronic Signature(s) Signed: 08/27/2019 6:06:00 PM By: Baltazar Najjar MD Entered By: Baltazar Najjar on 08/27/2019 12:58:27

## 2019-09-01 DIAGNOSIS — S81802A Unspecified open wound, left lower leg, initial encounter: Secondary | ICD-10-CM | POA: Diagnosis not present

## 2019-09-01 DIAGNOSIS — S81801A Unspecified open wound, right lower leg, initial encounter: Secondary | ICD-10-CM | POA: Diagnosis not present

## 2019-09-01 DIAGNOSIS — Z79899 Other long term (current) drug therapy: Secondary | ICD-10-CM | POA: Diagnosis not present

## 2019-09-01 DIAGNOSIS — Z7901 Long term (current) use of anticoagulants: Secondary | ICD-10-CM | POA: Diagnosis not present

## 2019-09-01 DIAGNOSIS — I4891 Unspecified atrial fibrillation: Secondary | ICD-10-CM | POA: Diagnosis not present

## 2019-09-01 DIAGNOSIS — I10 Essential (primary) hypertension: Secondary | ICD-10-CM | POA: Diagnosis not present

## 2019-09-01 DIAGNOSIS — E119 Type 2 diabetes mellitus without complications: Secondary | ICD-10-CM | POA: Diagnosis not present

## 2019-09-01 DIAGNOSIS — Z794 Long term (current) use of insulin: Secondary | ICD-10-CM | POA: Diagnosis not present

## 2019-09-01 DIAGNOSIS — I890 Lymphedema, not elsewhere classified: Secondary | ICD-10-CM | POA: Diagnosis not present

## 2019-09-06 ENCOUNTER — Encounter (HOSPITAL_BASED_OUTPATIENT_CLINIC_OR_DEPARTMENT_OTHER): Payer: Medicare Other | Admitting: Internal Medicine

## 2019-09-07 ENCOUNTER — Ambulatory Visit: Payer: Medicare Other

## 2019-09-07 ENCOUNTER — Other Ambulatory Visit: Payer: Self-pay

## 2019-09-07 ENCOUNTER — Encounter (HOSPITAL_BASED_OUTPATIENT_CLINIC_OR_DEPARTMENT_OTHER): Payer: Medicare Other | Attending: Internal Medicine | Admitting: Internal Medicine

## 2019-09-07 DIAGNOSIS — E11622 Type 2 diabetes mellitus with other skin ulcer: Secondary | ICD-10-CM | POA: Insufficient documentation

## 2019-09-07 DIAGNOSIS — L97221 Non-pressure chronic ulcer of left calf limited to breakdown of skin: Secondary | ICD-10-CM | POA: Insufficient documentation

## 2019-09-07 DIAGNOSIS — I890 Lymphedema, not elsewhere classified: Secondary | ICD-10-CM | POA: Diagnosis not present

## 2019-09-07 DIAGNOSIS — L97129 Non-pressure chronic ulcer of left thigh with unspecified severity: Secondary | ICD-10-CM | POA: Diagnosis not present

## 2019-09-07 DIAGNOSIS — I482 Chronic atrial fibrillation, unspecified: Secondary | ICD-10-CM | POA: Insufficient documentation

## 2019-09-07 DIAGNOSIS — L03116 Cellulitis of left lower limb: Secondary | ICD-10-CM | POA: Diagnosis not present

## 2019-09-07 DIAGNOSIS — E1121 Type 2 diabetes mellitus with diabetic nephropathy: Secondary | ICD-10-CM | POA: Diagnosis not present

## 2019-09-07 DIAGNOSIS — I87313 Chronic venous hypertension (idiopathic) with ulcer of bilateral lower extremity: Secondary | ICD-10-CM | POA: Insufficient documentation

## 2019-09-07 DIAGNOSIS — L97222 Non-pressure chronic ulcer of left calf with fat layer exposed: Secondary | ICD-10-CM | POA: Diagnosis not present

## 2019-09-07 DIAGNOSIS — E1140 Type 2 diabetes mellitus with diabetic neuropathy, unspecified: Secondary | ICD-10-CM | POA: Diagnosis not present

## 2019-09-07 DIAGNOSIS — L97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 right calf limited to breakdown of skin: Secondary | ICD-10-CM | POA: Diagnosis not present

## 2019-09-07 DIAGNOSIS — L97822 Non-pressure chronic ulcer of other part of left lower leg with fat layer exposed: Secondary | ICD-10-CM | POA: Diagnosis not present

## 2019-09-08 NOTE — Therapy (Deleted)
Pt will not be seen by this physical therapist. He has another physical therapist in [facility] for lymphedema.   [place], [place], [address] Phone: [phone]   Fax:  [phone]  Patient Details  Name: Dakari Cregger Rosete MRN: [id number] Date of Birth: 21-Jul-1951 Referring Provider:  Ricard Dillon, MD  Encounter Date: 09/07/2019   Ander Purpura, PT 09/08/2019, 9:09 AM  [place], [place], [address] Phone: [phone]   Fax:  [phone]

## 2019-09-09 DIAGNOSIS — I890 Lymphedema, not elsewhere classified: Secondary | ICD-10-CM | POA: Diagnosis not present

## 2019-09-09 DIAGNOSIS — R29898 Other symptoms and signs involving the musculoskeletal system: Secondary | ICD-10-CM | POA: Diagnosis not present

## 2019-09-14 ENCOUNTER — Encounter (HOSPITAL_BASED_OUTPATIENT_CLINIC_OR_DEPARTMENT_OTHER): Payer: Medicare Other | Admitting: Internal Medicine

## 2019-09-14 ENCOUNTER — Other Ambulatory Visit: Payer: Self-pay

## 2019-09-14 ENCOUNTER — Ambulatory Visit (INDEPENDENT_AMBULATORY_CARE_PROVIDER_SITE_OTHER): Payer: Medicare Other | Admitting: Family Medicine

## 2019-09-14 DIAGNOSIS — E1121 Type 2 diabetes mellitus with diabetic nephropathy: Secondary | ICD-10-CM | POA: Diagnosis not present

## 2019-09-14 DIAGNOSIS — E11622 Type 2 diabetes mellitus with other skin ulcer: Secondary | ICD-10-CM | POA: Diagnosis not present

## 2019-09-14 DIAGNOSIS — L97222 Non-pressure chronic ulcer of left calf with fat layer exposed: Secondary | ICD-10-CM | POA: Diagnosis not present

## 2019-09-14 DIAGNOSIS — I87313 Chronic venous hypertension (idiopathic) with ulcer of bilateral lower extremity: Secondary | ICD-10-CM | POA: Diagnosis not present

## 2019-09-14 DIAGNOSIS — E1140 Type 2 diabetes mellitus with diabetic neuropathy, unspecified: Secondary | ICD-10-CM | POA: Diagnosis not present

## 2019-09-14 DIAGNOSIS — L97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 right calf limited to breakdown of skin: Secondary | ICD-10-CM | POA: Diagnosis not present

## 2019-09-14 DIAGNOSIS — L97829 Non-pressure chronic ulcer of other part of left lower leg with unspecified severity: Secondary | ICD-10-CM | POA: Diagnosis not present

## 2019-09-14 DIAGNOSIS — L03116 Cellulitis of left lower limb: Secondary | ICD-10-CM | POA: Diagnosis not present

## 2019-09-14 DIAGNOSIS — L03119 Cellulitis of unspecified part of limb: Secondary | ICD-10-CM

## 2019-09-14 DIAGNOSIS — I482 Chronic atrial fibrillation, unspecified: Secondary | ICD-10-CM | POA: Diagnosis not present

## 2019-09-14 DIAGNOSIS — I890 Lymphedema, not elsewhere classified: Secondary | ICD-10-CM | POA: Diagnosis not present

## 2019-09-14 DIAGNOSIS — L97529 Non-pressure chronic ulcer of other part of left foot with unspecified severity: Secondary | ICD-10-CM | POA: Diagnosis not present

## 2019-09-14 DIAGNOSIS — L97221 Non-pressure chronic ulcer of left calf limited to breakdown of skin: Secondary | ICD-10-CM | POA: Diagnosis not present

## 2019-09-14 DIAGNOSIS — S71101A Unspecified open wound, right thigh, initial encounter: Secondary | ICD-10-CM | POA: Diagnosis not present

## 2019-09-14 NOTE — Therapy (Signed)
Pt will not be seen by this physical therapist. He has another physical therapist in [facility] for lymphedema.   [place], [place], [address] Phone: [phone]   Fax:  [phone]  Patient Details  Name: [name] MRN: [id number] Date of Birth: Jun 23, 1951 Referring Provider:  Ricard Dillon, MD  Encounter Date: 09/07/2019   Ander Purpura, PT 09/14/2019, 8:13 AM  [place], [place], [address] Phone: [phone]   Fax:  [phone]

## 2019-09-14 NOTE — Progress Notes (Signed)
Kevin Hurta Cowperis a 68 y.o.malepresents to the office today for monthly Bicillininjections, per physician's orders. Per PCP order, Bicillin 600,000 units/ml (x2)was administeredin each upper outer quadranttoday. Patient tolerated injection.  Patient next injection due:10/12/2019, appt Barney Drain, CMA

## 2019-09-15 ENCOUNTER — Ambulatory Visit: Payer: Medicare Other

## 2019-09-16 DIAGNOSIS — I890 Lymphedema, not elsewhere classified: Secondary | ICD-10-CM | POA: Diagnosis not present

## 2019-09-16 DIAGNOSIS — R29898 Other symptoms and signs involving the musculoskeletal system: Secondary | ICD-10-CM | POA: Diagnosis not present

## 2019-09-17 NOTE — Progress Notes (Signed)
Pt needs office injection of Bicillin 1.2 M units q month. Agree with this injection today. Signed:  Crissie Sickles, MD           09/17/2019

## 2019-09-21 ENCOUNTER — Encounter (HOSPITAL_BASED_OUTPATIENT_CLINIC_OR_DEPARTMENT_OTHER): Payer: Medicare Other | Admitting: Internal Medicine

## 2019-09-21 ENCOUNTER — Other Ambulatory Visit: Payer: Self-pay

## 2019-09-21 DIAGNOSIS — L97221 Non-pressure chronic ulcer of left calf limited to breakdown of skin: Secondary | ICD-10-CM | POA: Diagnosis not present

## 2019-09-21 DIAGNOSIS — S91105A Unspecified open wound of left lesser toe(s) without damage to nail, initial encounter: Secondary | ICD-10-CM | POA: Diagnosis not present

## 2019-09-21 DIAGNOSIS — I87313 Chronic venous hypertension (idiopathic) with ulcer of bilateral lower extremity: Secondary | ICD-10-CM | POA: Diagnosis not present

## 2019-09-21 DIAGNOSIS — S71101A Unspecified open wound, right thigh, initial encounter: Secondary | ICD-10-CM | POA: Diagnosis not present

## 2019-09-21 DIAGNOSIS — E1121 Type 2 diabetes mellitus with diabetic nephropathy: Secondary | ICD-10-CM | POA: Diagnosis not present

## 2019-09-21 DIAGNOSIS — L03116 Cellulitis of left lower limb: Secondary | ICD-10-CM | POA: Diagnosis not present

## 2019-09-21 DIAGNOSIS — S81802A Unspecified open wound, left lower leg, initial encounter: Secondary | ICD-10-CM | POA: Diagnosis not present

## 2019-09-21 DIAGNOSIS — I482 Chronic atrial fibrillation, unspecified: Secondary | ICD-10-CM | POA: Diagnosis not present

## 2019-09-21 DIAGNOSIS — E1140 Type 2 diabetes mellitus with diabetic neuropathy, unspecified: Secondary | ICD-10-CM | POA: Diagnosis not present

## 2019-09-21 DIAGNOSIS — I890 Lymphedema, not elsewhere classified: Secondary | ICD-10-CM | POA: Diagnosis not present

## 2019-09-21 DIAGNOSIS — L97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 right calf limited to breakdown of skin: Secondary | ICD-10-CM | POA: Diagnosis not present

## 2019-09-21 DIAGNOSIS — E11622 Type 2 diabetes mellitus with other skin ulcer: Secondary | ICD-10-CM | POA: Diagnosis not present

## 2019-09-29 ENCOUNTER — Encounter (HOSPITAL_BASED_OUTPATIENT_CLINIC_OR_DEPARTMENT_OTHER): Payer: Medicare Other | Attending: Physician Assistant

## 2019-09-29 ENCOUNTER — Other Ambulatory Visit: Payer: Self-pay

## 2019-09-29 DIAGNOSIS — L97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 right calf limited to breakdown of skin: Secondary | ICD-10-CM | POA: Diagnosis not present

## 2019-09-29 DIAGNOSIS — I890 Lymphedema, not elsewhere classified: Secondary | ICD-10-CM | POA: Diagnosis not present

## 2019-09-29 DIAGNOSIS — I87333 Chronic venous hypertension (idiopathic) 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: Secondary | ICD-10-CM | POA: Diagnosis not present

## 2019-09-29 DIAGNOSIS — R29898 Other symptoms and signs involving the musculoskeletal system: Secondary | ICD-10-CM | POA: Diagnosis not present

## 2019-09-29 DIAGNOSIS — E1140 Type 2 diabetes mellitus with diabetic neuropathy, unspecified: Secondary | ICD-10-CM | POA: Insufficient documentation

## 2019-09-29 DIAGNOSIS — I10 Essential (primary) hypertension: Secondary | ICD-10-CM | POA: Insufficient documentation

## 2019-09-29 DIAGNOSIS — E11622 Type 2 diabetes mellitus with other skin ulcer: Secondary | ICD-10-CM | POA: Diagnosis not present

## 2019-09-29 DIAGNOSIS — L03116 Cellulitis of left lower limb: Secondary | ICD-10-CM | POA: Insufficient documentation

## 2019-09-29 DIAGNOSIS — L97221 Non-pressure chronic ulcer of left calf limited to breakdown of skin: Secondary | ICD-10-CM | POA: Insufficient documentation

## 2019-09-29 NOTE — Progress Notes (Signed)
SUHAYB, ANZALONE ([id number]) Visit Report for 09/29/2019 Arrival Information Details Patient Name: Date of Service: BROUGHTON, EPPINGER 09/29/2019 12:30 PM Medical Record [id number] Patient Account Number: [id number] Date of Birth/Sex: Treating RN: [birth year] (68 y.o. [name] Primary Care Keveon Amsler: Nicoletta Ba Other Clinician: Referring Bren Steers: Treating Gracelin Weisberg/Extender:[name], [name], [name] in Treatment: 192 Visit Information History Since Last Visit Walker Added or deleted any medications: No Patient Arrived: Any new allergies or adverse reactions: No Arrival Time: 12:43 Had a fall or experienced change in No Accompanied By: self activities of daily living that may affect Transfer Assistance: None risk of falls: Patient Identification Verified: Yes Signs or symptoms of abuse/neglect since last No Secondary Verification Process Completed: Yes visito Patient Requires Transmission-Based No Hospitalized since last visit: No Precautions: Implantable device outside of the clinic excluding No Patient Has Alerts: Yes cellular tissue based products placed in the center since last visit: Has Dressing in Place as Prescribed: Yes Has Compression in Place as Prescribed: Yes Pain Present Now: No Electronic Signature(s) Signed: 09/29/2019 3:14:28 PM By: Zenaida Deed RN, BSN Entered By: Zenaida Deed on 09/29/2019 12:43:50 -------------------------------------------------------------------------------- Compression Therapy Details Patient Name: Date of Service: [name] 09/29/2019 12:30 PM Medical Record [id number] Patient Account Number: [id number] Date of Birth/Sex: Treating RN: 06-18-1951 (68 y.o. [name] Primary Care Starlette Thurow: Nicoletta Ba Other Clinician: Referring Brantlee Hinde: Treating Ayleah Hofmeister/Extender:[name], [name], [name] in Treatment: 192 Compression Therapy Performed for Wound Wound #140  Left,Posterior Lower Leg Assessment: Performed By: Clinician Zenaida Deed, RN Compression Type: Four Layer Electronic Signature(s) Signed: 09/29/2019 3:14:28 PM By: Zenaida Deed RN, BSN Entered By: Zenaida Deed on 09/29/2019 13:14:22 -------------------------------------------------------------------------------- Compression Therapy Details Patient Name: Date of Service: JADARIAN, MCKAY 09/29/2019 12:30 PM Medical Record [id number] Patient Account Number: [id number] Date of Birth/Sex: Treating RN: Dec 05, 1950 (68 y.o. [name] Primary Care Schae Cando: Nicoletta Ba Other Clinician: Referring Dayleen Beske: Treating Seanpaul Preece/Extender:[name], [name], [name] in Treatment: 192 Compression Therapy Performed for Wound NonWound Condition Lymphedema - Right Leg Assessment: Performed By: Clinician Zenaida Deed, RN Compression Type: Henriette Combs Electronic Signature(s) Signed: 09/29/2019 3:14:28 PM By: Zenaida Deed RN, BSN Entered By: Zenaida Deed on 09/29/2019 13:14:42 -------------------------------------------------------------------------------- Encounter Discharge Information Details Patient Name: Date of Service: SEVERUS, BRODZINSKI 09/29/2019 12:30 PM Medical Record [id number] Patient Account Number: [id number] Date of Birth/Sex: Treating RN: 05/15/1951 (68 y.o. [name] Primary Care Sarvesh Meddaugh: Nicoletta Ba Other Clinician: Referring Marlan Steward: Treating Lady Wisham/Extender:[name], [name], [name] in Treatment: 192 Encounter Discharge Information Items Discharge Condition: Stable Ambulatory Status: Walker Discharge Destination: Home Transportation: Private Auto Accompanied By: self Schedule Follow-up Appointment: Yes Clinical Summary of Care: Patient Declined Electronic Signature(s) Signed: 09/29/2019 3:14:28 PM By: Zenaida Deed RN, BSN Entered By: Zenaida Deed on 09/29/2019  13:20:22 -------------------------------------------------------------------------------- Patient/Caregiver Education Details Patient Name: [name] 11/25/2020andnbsp12:30 Date of Service: PM Medical Record [id number] Number: Patient Account Number: [id number] Treating RN: 1951/09/27 (68 y.o. Zenaida Deed Date of Birth/Gender: M) Other Clinician: Primary Care Physician: Nicoletta Ba Treating Lenda Kelp Referring Physician: Physician/Extender: Santo Held in Treatment: 192 Education Assessment Education Provided To: Patient Education Topics Provided Venous: Methods: Explain/Verbal Responses: Reinforcements needed, State content correctly Electronic Signature(s) Signed: 09/29/2019 3:14:28 PM By: Zenaida Deed RN, BSN Entered By: Zenaida Deed on 09/29/2019 13:20:04 -------------------------------------------------------------------------------- Wound Assessment Details Patient Name: Date of Service: TYMARION, EVERARD 09/29/2019 12:30 PM Medical Record [id number] Patient Account Number: [id number] Date of Birth/Sex: Treating RN: [birth year] (68 y.o. M)  Baruch Gouty Primary Care Aimee Heldman: [name] Other Clinician: Referring Standley Bargo: Treating Erasto Sleight/Extender:[name], Dema Severin, [name] in Treatment: 192 Wound Status Wound Number: 140 Primary Lymphedema Etiology: Wound Location: Left Lower Leg - Posterior Wound Open Wounding Event: Blister Status: Date Acquired: 08/27/2019 Comorbid Chronic sinus problems/congestion, Weeks Of Treatment: 4 History: Arrhythmia, Hypertension, Peripheral Arterial Clustered Wound: No Disease, Type II Diabetes, History of Burn, Gout, Confinement Anxiety Wound Measurements Length: (cm) 0.3 % Reduc Width: (cm) 0.3 % Reduc Depth: (cm) 0.1 Epithel Area: (cm) 0.071 Tunnel Volume: (cm) 0.007 Underm Wound Description Full Thickness Without Exposed Support Foul O Classification:  Structures Slough Wound Flat and Intact Margin: Exudate Small Amount: Exudate Serous Type: Exudate amber Color: Wound Bed Granulation Amount: Large (67-100%) Granulation Quality: Red Fascia Necrotic Amount: None Present (0%) Fat La Tendon Muscle Joint Bone E dor After Cleansing: No /Fibrino No Exposed Structure Exposed: No yer (Subcutaneous Tissue) Exposed: Yes Exposed: No Exposed: No Exposed: No xposed: No tion in Area: 100% tion in Volume: 100% ialization: Large (67-100%) ing: No ining: No Treatment Notes Wound #140 (Left, Posterior Lower Leg) 2. Periwound Care Antifungal cream Moisturizing lotion 3. Primary Dressing Applied Calcium Alginate Ag 4. Secondary Dressing Dry Gauze 6. Support Layer Applied 4 layer compression Water quality scientist) Signed: 09/29/2019 3:14:28 PM By: Baruch Gouty RN, BSN Entered By: Baruch Gouty on 09/29/2019 13:12:19 -------------------------------------------------------------------------------- Wound Assessment Details Patient Name: Date of Service: CARLIE, CORPUS 09/29/2019 12:30 PM Medical Record [id number] Patient Account Number: [id number] Date of Birth/Sex: Treating RN: 1950/12/07 (68 y.o. [name] Primary Care Wanza Szumski: [name] Other Clinician: Referring Carletta Feasel: Treating Timesha Cervantez/Extender:[name], Dema Severin, [name] in Treatment: 192 Wound Status Wound Number: 142 Primary Lymphedema Etiology: Wound Location: Left Lower Leg - Anterior Wound Open Wounding Event: Blister Status: Date Acquired: 09/07/2019 Comorbid Chronic sinus problems/congestion, Weeks Of Treatment: 3 History: Arrhythmia, Hypertension, Peripheral Arterial Clustered Wound: No Disease, Type II Diabetes, History of Burn, Gout, Confinement Anxiety Wound Measurements Length: (cm) 0.1 % Reduc Width: (cm) 0.1 % Reduc Depth: (cm) 0.1 Epithel Area: (cm) 0.008 Tunnel Volume: (cm) 0.001  Underm Wound Description Full Thickness Without Exposed Support Classification: Structures Wound Flat and Intact Margin: Exudate Small Amount: Exudate Serous Type: Exudate amber Color: Wound Bed Granulation Amount: Small (1-33%) Granulation Quality: Pink Necrotic Amount: None Present (0%) Foul Odor After Cleansing: No Slough/Fibrino No Exposed Structure Fascia Exposed: No Fat Layer (Subcutaneous Tissue) Exposed: Yes Tendon Exposed: No Muscle Exposed: No Joint Exposed: No Bone Exposed: No tion in Area: 99.6% tion in Volume: 99.6% ialization: Large (67-100%) ing: No ining: No Electronic Signature(s) Signed: 09/29/2019 3:14:28 PM By: Baruch Gouty RN, BSN Entered By: Baruch Gouty on 09/29/2019 13:13:12 -------------------------------------------------------------------------------- Gregory Details Patient Name: Date of Service: [name] 09/29/2019 12:30 PM Medical Record [id number] Patient Account Number: [id number] Date of Birth/Sex: Treating RN: [birth year] (68 y.o. [name] Primary Care Anokhi Shannon: [name] Other Clinician: Referring Paxon Propes: Treating Malene Blaydes/Extender:[name], Dema Severin, [name] in Treatment: 192 Vital Signs Time Taken: 12:43 Temperature (F): 97.6 Height (in): 70 Pulse (bpm): 55 Source: Stated Respiratory Rate (breaths/min): 22 Weight (lbs): 380.2 Blood Pressure (mmHg): 147/57 Source: Stated Capillary Blood Glucose (mg/dl): 161 Body Mass Index (BMI): 54.5 Reference Range: 80 - 120 mg / dl Notes glucose per pt report Electronic Signature(s) Signed: 09/29/2019 3:14:28 PM By: Baruch Gouty RN, BSN Entered By: Baruch Gouty on 09/29/2019 12:45:09

## 2019-09-29 NOTE — Progress Notes (Signed)
DREXLER, MALAND ([id number]) Visit Report for 09/29/2019 SuperBill Details Patient Name: Date of Service: AZEEZ, DUNKER 09/29/2019 Medical Record [id number] Patient Account Number: [id number] Date of Birth/Sex: Treating RN: 1951-07-10 (68 y.o. [name] Primary Care Provider: [name] Other Clinician: Referring Provider: Treating Provider/Extender:[name], Dema Severin, [name] in Treatment: 192 Diagnosis Coding ICD-10 Codes Code Description R42.706 Non-pressure chronic ulcer of right calf limited to breakdown of skin L97.221 Non-pressure chronic ulcer of left calf limited to breakdown of skin 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 I89.0 Lymphedema, not elsewhere classified E11.622 Type 2 diabetes mellitus with other skin ulcer 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, unspecified L03.116 Cellulitis of left lower limb Facility Procedures CPT4 Code Description Modifier Quantity [id number] (Facility Use Only) [phone] - APPLY MULTLAY COMPRS LWR LT LEG 59 1 [id number] (Facility Use Only) [id number] - APPLY UNNA BOOT RT 1 Electronic Signature(s) Signed: 09/29/2019 3:14:28 PM By: Baruch Gouty RN, BSN Signed: 09/29/2019 4:52:33 PM By: Worthy Keeler PA-C Entered By: Baruch Gouty on 09/29/2019 13:20:44

## 2019-10-03 IMAGING — DX DG FOOT COMPLETE 3+V*L*
3 series · 3 of 3 positions shown · non-contrast
Comparison: None.

CLINICAL DATA: Diabetic patient with renal failure. Painful
ulceration on the left heel.

EXAM:
LEFT FOOT - COMPLETE 3+ VIEW

[foot ap]
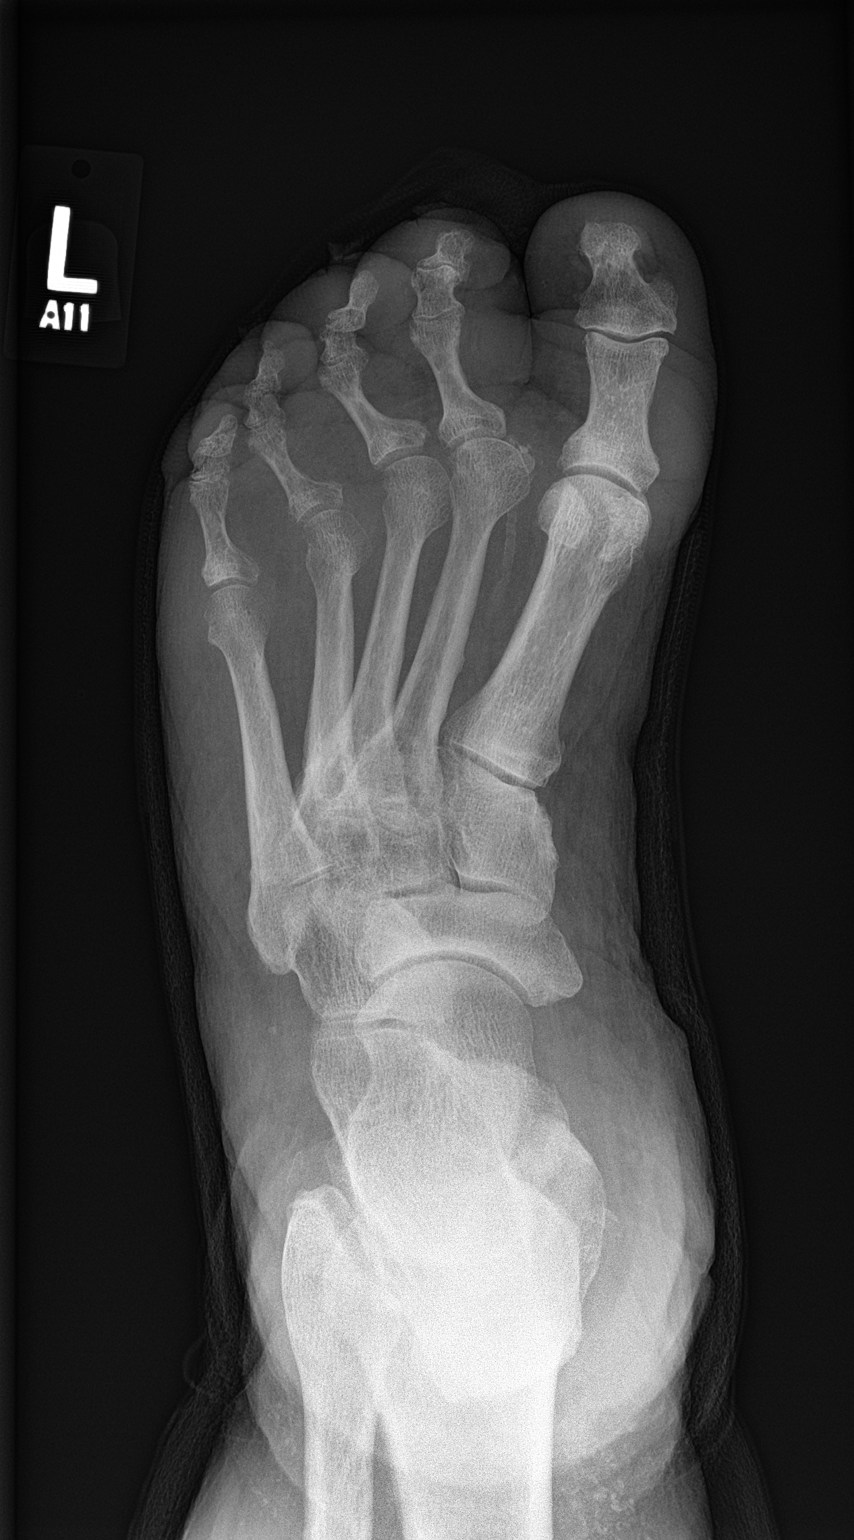

[foot obl]
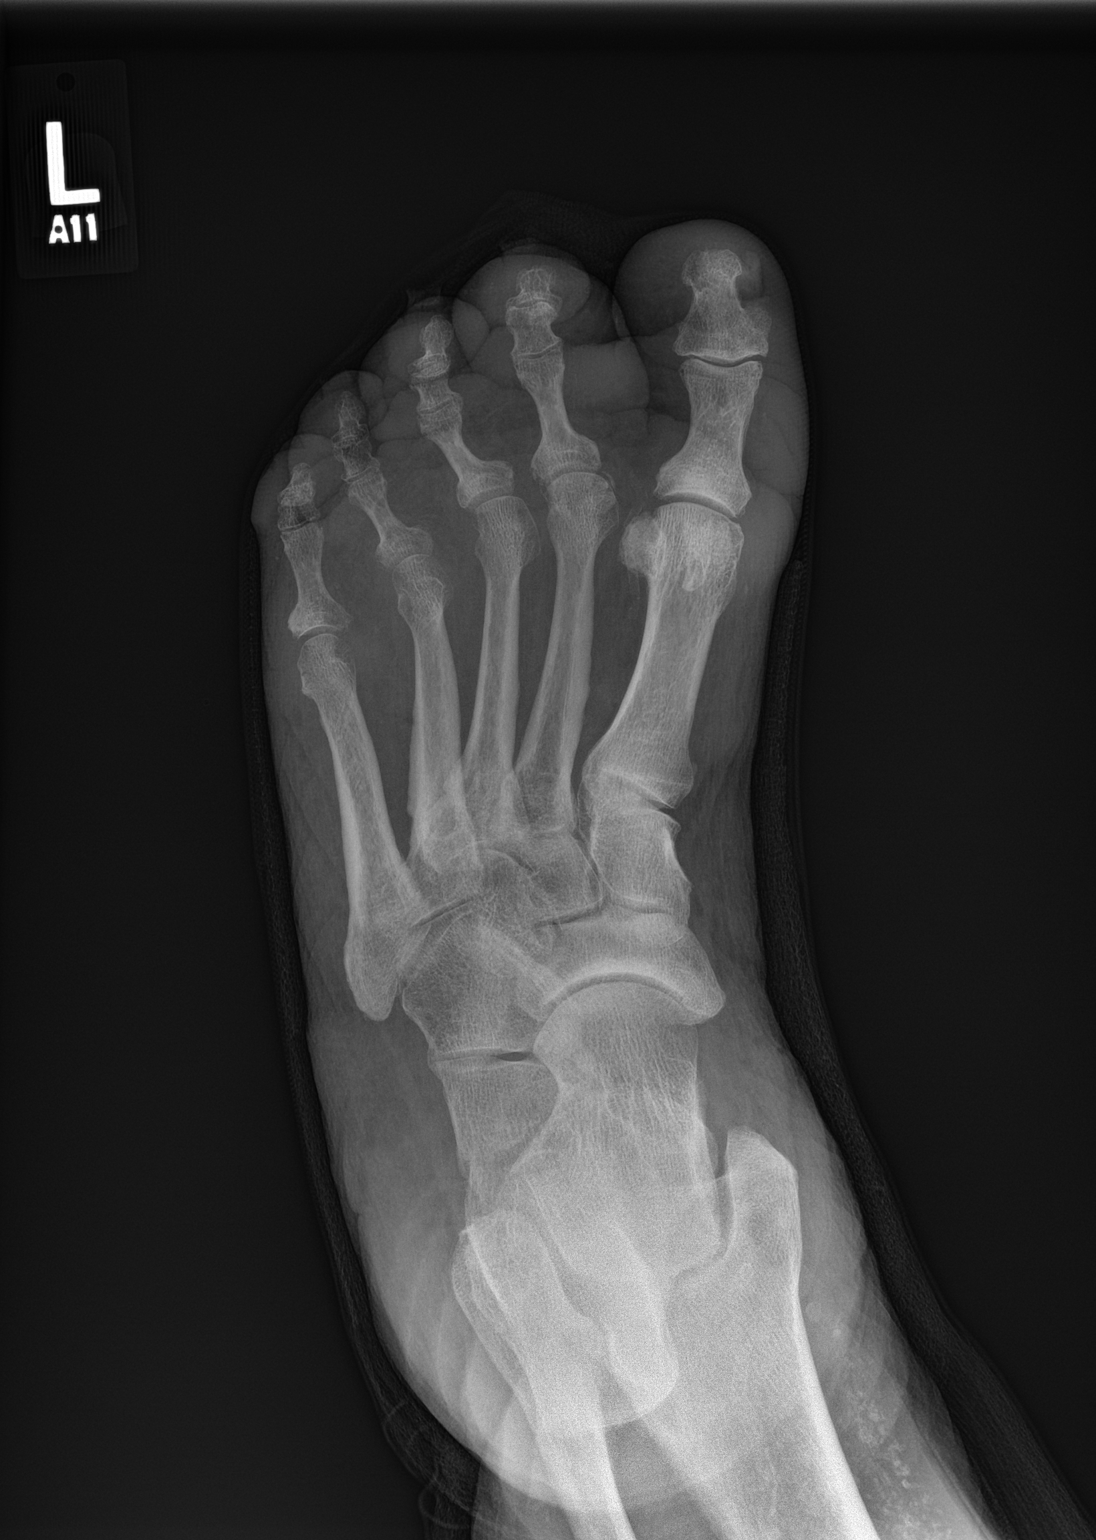

[foot lat]
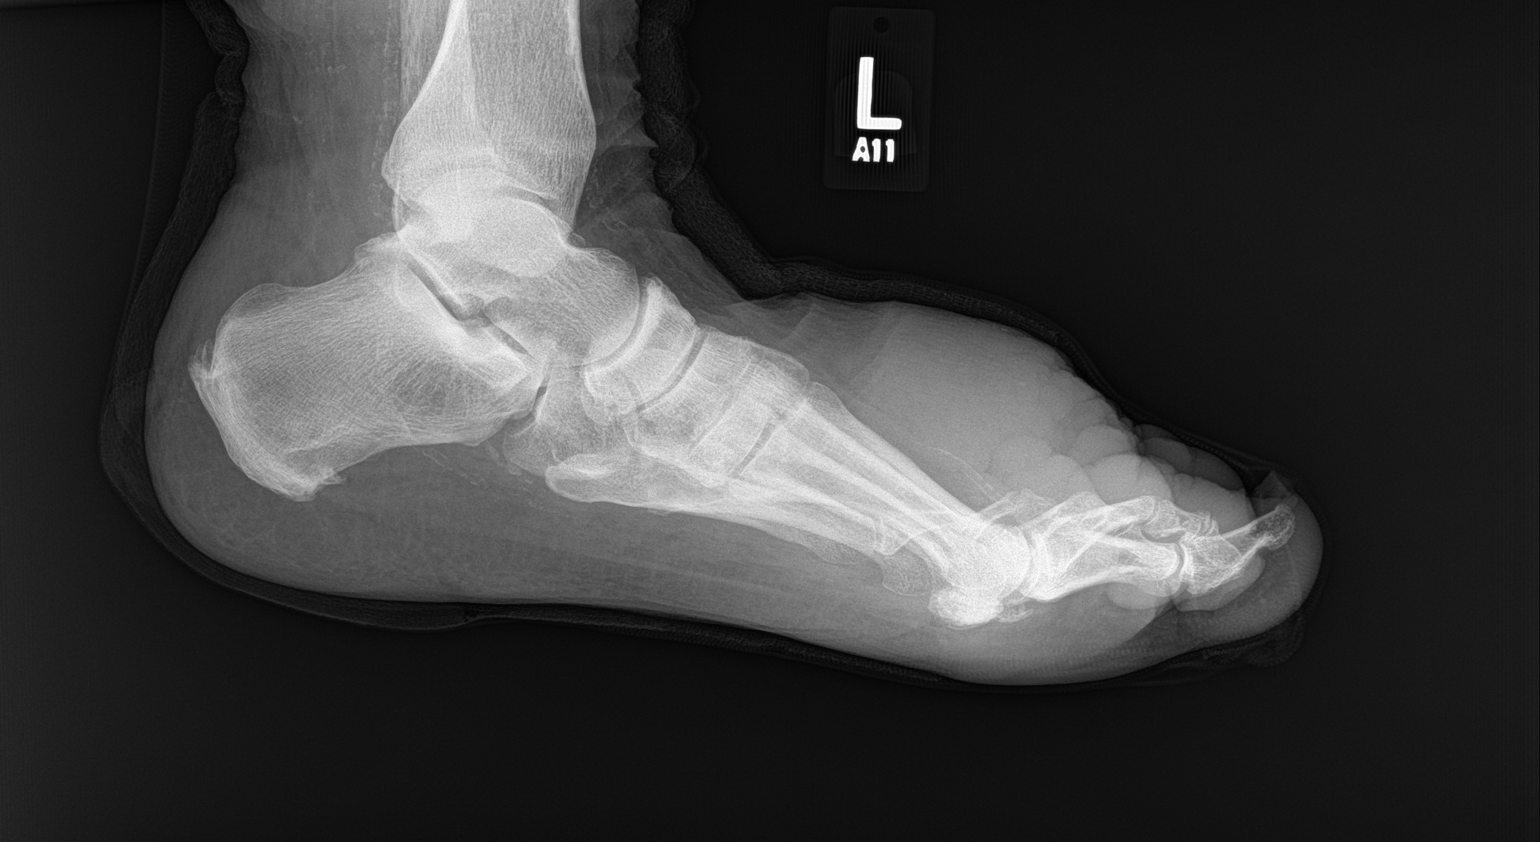

[3 of 3 positions shown; findings below may reference images not displayed]

FINDINGS: Skin ulceration is seen over the left heel. No underlying radiopaque
foreign body or soft tissue gas. No bony destructive change. No
fracture or dislocation. Severe soft tissue swelling of the toes and
dorsum of the distal foot is identified. Atherosclerosis is noted.
IMPRESSION: Ulceration on the heel without evidence of underlying osteomyelitis.

Severe soft tissue swelling about the toes and dorsum of the distal
foot.

## 2019-10-04 ENCOUNTER — Other Ambulatory Visit: Payer: Self-pay

## 2019-10-04 ENCOUNTER — Encounter (HOSPITAL_BASED_OUTPATIENT_CLINIC_OR_DEPARTMENT_OTHER): Payer: Medicare Other

## 2019-10-04 DIAGNOSIS — I890 Lymphedema, not elsewhere classified: Secondary | ICD-10-CM | POA: Diagnosis not present

## 2019-10-04 DIAGNOSIS — I87313 Chronic venous hypertension (idiopathic) with ulcer of bilateral lower extremity: Secondary | ICD-10-CM | POA: Diagnosis not present

## 2019-10-04 DIAGNOSIS — E1140 Type 2 diabetes mellitus with diabetic neuropathy, unspecified: Secondary | ICD-10-CM | POA: Diagnosis not present

## 2019-10-04 DIAGNOSIS — L97221 Non-pressure chronic ulcer of left calf limited to breakdown of skin: Secondary | ICD-10-CM | POA: Diagnosis not present

## 2019-10-04 DIAGNOSIS — E11622 Type 2 diabetes mellitus with other skin ulcer: Secondary | ICD-10-CM | POA: Diagnosis not present

## 2019-10-04 DIAGNOSIS — L97229 Non-pressure chronic ulcer of left calf with unspecified severity: Secondary | ICD-10-CM | POA: Diagnosis not present

## 2019-10-04 DIAGNOSIS — L97822 Non-pressure chronic ulcer of other part of left lower leg with fat layer exposed: Secondary | ICD-10-CM | POA: Diagnosis not present

## 2019-10-04 DIAGNOSIS — E1121 Type 2 diabetes mellitus with diabetic nephropathy: Secondary | ICD-10-CM | POA: Diagnosis not present

## 2019-10-04 DIAGNOSIS — I482 Chronic atrial fibrillation, unspecified: Secondary | ICD-10-CM | POA: Diagnosis not present

## 2019-10-04 DIAGNOSIS — L03116 Cellulitis of left lower limb: Secondary | ICD-10-CM | POA: Diagnosis not present

## 2019-10-04 DIAGNOSIS — L97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 right calf limited to breakdown of skin: Secondary | ICD-10-CM | POA: Diagnosis not present

## 2019-10-04 NOTE — Progress Notes (Signed)
Kevin, [name] ([id number]) Visit Report for 10/04/2019 HPI Details Patient Name: Date of Service: Kevin, [name] 10/04/2019 1:45 PM Medical Record [id number] Patient Account Number: [id number] Date of Birth/Sex: Treating RN: 1950/12/15 (68 y.o. [name] Primary Care Provider: Nicoletta Ba Other Clinician: Referring Provider: Treating Provider/Extender:[name], Lottie Rater, [name] in Treatment: 193 History of Present Illness HPI Description: Referred by PCP for consultation. Patient has long standing history of BLE venous stasis, no prior ulcerations. At beginning of month, developed cellulitis and weeping. Received IM Rocephin followed by Keflex and resolved. Wears compression stocking, appr 6 months old. Not sure strength. No present drainage. 01/22/16 this is a patient who is a type II diabetic on insulin. 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 He tells me he religiously wears pressure stockings of uncertain strength. He was here with weeping edema about 8 months ago but did not have an open wound. Roughly a month ago he had a reopening on his bilateral legs. He is been using bandages and Neosporin. He does not complain of pain. 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 Last BUN/creatinine I have is from 11/20/15 at 13 and 1.0 respectively 01/29/16; patient arrives today having tolerated the Profore wrap. He brought in his stockings and these are 18 mmHg stockings he bought from [place]. The compression here is likely inadequate. He does not complain of pain or excessive drainage she has no systemic symptoms. The wound on the right looks improved as does the one on the left although one on the left is more substantial with still tissue at risk below the actual wound area on the bilateral posterior calf 02/05/16;  patient arrives with poor edema control. He states that we did put a 4 layer compression on it last week. No weight appear 5 this. 02/12/16; the area on the posterior right Has healed. The left Has a substantial wound that has necrotic surface eschar that requires a debridement with a curette. 02/16/16;the patient called or a Nurse visit secondary to increased swelling. He had been in earlier in the week with his right leg healed. He was transitioned to is on pressure stocking on the right leg with the only open wound on the left, a substantial area on the left posterior calf. Note he has a history of severe lower extremity edema, he has a history of chronic atrial fibrillation but not heart failure per my notes but I'll need to research this. He is not complaining of chest pain shortness of breath or orthopnea. The intake nurse noted blisters on the previously closed right leg 02/19/16; this is the patient's regular visit day. I see him on Friday with escalating edema new wounds on the right leg and clear signs of at least right ventricular heart failure. I increased his Lasix to 40 twice a day. He is returning currently in follow-up. States he is noticed a decrease in that the edema 02/26/16 patient's legs have much less edema. There is nothing really open on the right leg. The left leg has improved condition of the large superficial wound on the posterior left leg 03/04/16; edema control is very much better. The patient's right leg wounds have healed. On the left leg he continues to have severe venous inflammation on the posterior aspect of the left leg. There is no tenderness and I don't think any of this is cellulitis.  03/11/16; patient's right leg is married healed and he is in his own stocking. The patient's left leg has deteriorated somewhat. There is a lot of erythema around the wound on the posterior left leg. There is also a significant rim of erythema posteriorly just above where the wrap  would've ended there is a new wound in this location and a lot of tenderness. Can't rule out cellulitis in this area. 03/15/16; patient's right leg remains healed and he is in his own stocking. The patient's left leg is much better than last review. His major wound on the posterior aspect of his left Is almost fully epithelialized. He has 3 small injuries from the wraps. Really. Erythema seems a lot better on antibiotics 03/18/16; right leg remains healed and he is in his own stocking. The patient's left leg is much better. The area on the posterior aspect of the left calf is fully epithelialized. His 3 small injuries which were wrap injuries on the left are improved only one seems still open his erythema has resolved 03/25/16; patient's right leg remains healed and he is in his own stocking. There is no open area today on the left leg posterior leg is completely closed up. His wrap injuries at the superior aspect of his leg are also resolved. He looks as though he has some irritation on the dorsal ankle but this is fully epithelialized without evidence of infection. 03/28/16; we discharged this patient on Monday. Transitioned him into his own stocking. There were problems almost immediately with uncontrolled swelling weeping edema multiple some of which have opened. He does not feel systemically unwell in particular no chest pain no shortness of breath and he does not feel 04/08/16; the edema is under better control with the Profore light wrap but he still has pitting edema. There is one large wound anteriorly 2 on the medial aspect of his left leg and 3 small areas on the superior posterior calf. Drainage is not excessive he is tolerating a Profore light well 04/15/16; put a Profore wrap on him last week. This is controlled is edema however he had a lot of pain on his left anterior foot most of his wounds are healed 04/22/16 once again the patient has denuded areas on the left anterior foot which he  states are because his wrap slips up word. He saw his primary physician today is on Lasix 40 twice a day and states that he his weight is down 20 pounds over the last 3 months. 04/29/16: Much improved. left anterior foot much improved. He is now on Lasix 80 mg per day. Much improved edema control 05/06/16; I was hoping to be able to discharge him today however once again he has blisters at a low level of where the compression was placed last week mostly on his left lateral but also his left medial leg and a small area on the anterior part of the left foot. 05/09/16; apparently the patient went home after his appointment on 7/4 later in the evening developing pain in his upper medial thigh together with subjective fever and chills although his temperature was not taken. The pain was so intense he felt he would probably have to call 911. However he then remembered that he had leftover doxycycline from a previous round of antibiotics and took these. By the next morning he felt a lot better. He called and spoke to one of our nurses and I approved doxycycline over the phone thinking that this was in relation to the  wounds we had previously seen although they were definitely were not. The patient feels a lot better old fever no chills he is still working. Blood sugars are reasonably controlled 05/13/16; patient is back in for review of his cellulitis on his anterior medial upper thigh. He is taking doxycycline this is a lot better. Culture I did of the nodular area on the dorsal aspect of his foot grew MRSA this also looks a lot better. 05/20/16; the patient is cellulitis on the medial upper thigh has resolved. All of his wound areas including the left anterior foot, areas on the medial aspect of the left calf and the lateral aspect of the calf at all resolved. He has a new blister on the left dorsal foot at the level of the fourth toe this was excised. No evidence of infection 05/27/16; patient continues to  complain weeping edema. He has new blisterlike wounds on the left anterior lateral and posterior lateral calf at the top of his wrap levels. The area on his left anterior foot appears better. He is not complaining of fever, pain or pruritus in his feet. 05/30/16; the patient's blisters on his left anterior leg posterior calf all look improved. He did not increase the Lasix 100 mg as I suggested because he was going to run out of his 40 mg tablets. He is still having weeping edema of his toes 06/03/16; I renewed his Lasix at 80 mg once a day as he was about to run out when I last saw him. He is on 80 mg of Lasix now. I have asked him to cut down on the excessive amount of water he was drinking and asked him to drink according to his thirst mechanisms 06/12/2016 -- was seen 2 days ago and was supposed to wear his compression stockings at home but he is developed lymphedema and superficial blisters on the left lower extremity and hence came in for a review 06/24/16; the remaining wound is on his left anterior leg. He still has edema coming from between his toes. There is lymphedema here however his edema is generally better than when I last saw this. He has a history of atrial fibrillation but does not have a known history of congestive heart failure nevertheless I think he probably has this at least on a diastolic basis. 07/01/16 I reviewed his echocardiogram from January 2017. This was essentially normal. He did not have LVH, EF of 55-60%. His right ventricular function was normal although he did have trivial tricuspid and pulmonic regurgitation. This is not audible on exam however. I increased his Lasix to do massive edema in his legs well above his knees I think in early July. He was also drinking an excessive amount of water at the time. 07/15/16; missed his appointment last week because of the Labor Day holiday on Monday. He could not get another appointment later in the week. Started to feel the  wrap digging in superiorly so we remove the top half and the bottom half of his wrap. He has extensive erythema and blistering superiorly in the left leg. Very tender. Very swollen. Edema in his foot with leaking edema fluid. He has not been systemically unwell 07/22/16; the area on the left leg laterally required some debridement. The medial wounds look more stable. His wrap injury wounds appear to have healed. Edema and his foot is better, weeping edema is also better. He tells me he is meeting with the supplier of the external compression pumps at work 08/05/16;  the patient was on vacation last week in [facility]. His wrap is been on for an extended period of time. Also over the weekend he developed an extensive area of tender erythema across his anterior medial thigh. He took to doxycycline yesterday that he had leftover from a previous prescription. The patient complains of weeping edema coming out of his toes 08/08/16; I saw this patient on 10/2. He was tender across his anterior thigh. I put him on doxycycline. He returns today in follow-up. He does not have any open wounds on his lower leg, he still has edema weeping into his toes. 08/12/16; patient was seen back urgently today to follow-up for his extensive left thigh cellulitis/erysipelas. He comes back with a lot less swelling and erythema pain is much better. I believe I gave him Augmentin and Cipro. His wrap was cut down as he stated a roll down his legs. He developed blistering above the level of the wrap that remained. He has 2 open blisters and 1 intact. 08/19/16; patient is been doing his primary doctor who is increased his Lasix from 40-80 once a day or 80 already has less edema. Cellulitis has remained improved in the left thigh. 2 open areas on the posterior left calf 08/26/16; he returns today having new open blisters on the anterior part of his left leg. He has his compression pumps but is not yet been shown how to use some  vital representative from the supplier. 09/02/16 patient returns today with no open wounds on the left leg. Some maceration in his plantar toes 09/10/2016 -- Dr. Leanord Hawking had recently discharged him on 09/02/2016 and he has come right back with redness swelling and some open ulcers on his left lower extremity. He says this was caused by trying to apply his compression stockings and he's been unable to use this and has not been able to use his lymphedema pumps. He had some doxycycline leftover and he has started on this a few days ago. 09/16/16; there are no open wounds on his leg on the left and no evidence of cellulitis. 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 He does not complain of symptoms here. I am not clear use using his external compression pumps. 09/23/16; I have not seen this patient in 2 [name]. He canceled his appointment 10 days ago as he was going on vacation. He tells me that on Monday he noticed a large area on his posterior left leg which is been draining copiously and is reopened into a large wound. He is been using ABDs and the external part of his juxtalite, according to our nurse this was not on properly. 10/07/16; Still a substantial area on the posterior left leg. Using silver alginate 10/14/16; in general better although there is still open area which looks healthy. Still using silver alginate. He reminds me that this happen before he left for [facility]. Today while he was showering in the morning. He had been using his juxtalite's 10/21/16; the area on his posterior left leg is fully epithelialized. However he arrives today with a large area of tender erythema in his medial and posterior left thigh just above the knee. I have marked the area. Once again he is reluctant to consider hospitalization. I treated him with oral antibiotics in the past for a similar situation with resolution I think with doxycycline  however this area it seems more extensive to me. He is not complaining of fever  but does have chills and says states he is thirsty. His blood sugar today was in the 140s at home 10/25/16 the area on his posterior left leg is fully epithelialized although there is still some weeping edema. The large area of tenderness and erythema in his medial and posterior left thigh is a lot less tender although there is still a lot of swelling in this thigh. He states he feels a lot better. He is on doxycycline and Augmentin that I started last week. This will continued until Tuesday, December 26. I have ordered a duplex ultrasound of the left thigh rule out DVT whether there is an abscess something that would need to be drained I would also like to know. 11/01/16; he still has weeping edema from a not fully epithelialized area on his left posterior calf. Most of the rest of this looks a lot better. He has completed his antibiotics. His thigh is a lot better. Duplex ultrasound did not show a DVT in the thigh 11/08/16; he comes in today with more Denuded surface epithelium from the posterior aspect of his calf. There is no real evidence of cellulitis. The superior aspect of his wrap appears to have put quite an indentation in his leg just below the knee and this may have contributed. He does not complain of pain or fever. We have been using silver alginate as the primary dressing. The area of cellulitis in the right thigh has totally resolved. He has been using his compression stockings once a week 11/15/16; the patient arrives today with more loss of epithelium from the posterior aspect of his left calf. He now has a fairly substantial wound in this area. The reason behind this deterioration isn't exactly clear although his edema is not well controlled. He states he feels he is generally more swollen systemically. He is not complaining of chest pain shortness of breath fever. Tells me he has an appointment with his  primary physician in early February. He is on 80 mg of oral Lasix a day. He claims compliance with the external compression pumps. He is not having any pain in his legs similar to what he has with his recurrent cellulitis 11/22/16; the patient arrives a follow-up of his large area on his left lateral calf. This looks somewhat better today. He came in earlier in the week for a dressing change since I saw him a week ago. He is not complaining of any pain no shortness of breath no chest pain 11/28/16; the patient arrives for follow-up of his large area on the left lateral calf this does not look better. In fact it is larger weeping edema. The surface of the wound does not look too bad. We have been using silver alginate although I'm not certain that this is a dressing issue. 12/05/16; again the patient follows up for a large wound on the left lateral and left posterior calf this does not look better. There continues to be weeping edema necrotic surface tissue. More worrisome than this once again there is erythema below the wound involving the distal Achilles and heel suggestive of cellulitis. He is on his feet working most of the day of this is not going well. We are changing his dressing twice a week to facilitate the drainage. 12/12/16; not much change in the overall dimensions of the large area on the left posterior calf. This is very inflamed looking. I gave him an. Doxycycline last week does not really seem to have helped. He found the wrap  very painful indeed it seems to of dog into his legs superiorly and perhaps around the heel. He came in early today because the drainage had soaked through his dressings. 12/19/16- patient arrives for follow-up evaluation of his left lower extremity ulcers. He states that he is using his lymphedema pumps once daily when there is "no drainage". He admits to not using his lipedema pumps while under current treatment. His blood sugars have been consistently between  150-200. 12/26/16; the patient is not using his compression pumps at home because of the wetness on his feet. I've advised him that I think it's important for him to use this daily. He finds his feet too wet, he can put a plastic bag over his legs while he is in the pumps. Otherwise I think will be in a vicious circle. We are using silver alginate to the major area on his left posterior calf 01/02/17; the patient's posterior left leg has further of all into 3 open wounds. All of them covered with a necrotic surface. He claims to be using his compression pumps once a day. His edema control is marginal. Continue with silver alginate 01/10/17; the patient's left posterior leg actually looks somewhat better. There is less edema, less erythema. Still has 3 open areas covered with a necrotic surface requiring debridement. He claims to be using his compression pumps once a day his edema control is better 01/17/17; the patient's left posterior calf look better last week when I saw him and his wrap was changed 2 days ago. He has noted increasing pain in the left heel and arrives today with much larger wounds extensive erythema extending down into the entire heel area especially tender medially. He is not systemically unwell CBGs have been controlled no fever. Our intake nurse showed me limegreen drainage on his AVD pads. 01/24/17; his usual this patient responds nicely to antibiotics last week giving him Levaquin for presumed Pseudomonas. The whole entire posterior part of his leg is much better much less inflamed and in the case of his Achilles heel area much less tender. He has also had some epithelialization posteriorly there are still open areas here and still draining but overall considerably better 01/31/17- He has continue to tolerate the compression wraps. he states that he continues to use the lymphedema pumps daily, and can increase to twice daily on the weekends. He is voicing no complaints or concerns  regarding his LLE ulcers 02/07/17-he is here for follow-up evaluation. He states that he noted some erythema to the left medial and anterior thigh, which he states is new as of yesterday. He is concerned about recurrent cellulitis. He states his blood sugars have been slightly elevated, this morning in the 180s 02/14/17; he is here for follow-up evaluation. When he was last here there was erythema superiorly from his posterior wound in his anterior thigh. He was prescribed Levaquin however a culture of the wound surface grew MRSA over the phone I changed him to doxycycline on Monday and things seem to be a lot better. 02/24/17; patient missed his appointment on Friday therefore we changed his nurse visit into a physician visit today. Still using silver alginate on the large area of the posterior left thigh. He isn't new area on the dorsal left second toe 03/03/17; actually better today although he admits he has not used his external compression pumps in the last 2 days or so because of work responsibilities over the weekend. 03/10/17; continued improvement. External compression pumps once a day almost  all of his wounds have closed on the posterior left calf. Better edema control 03/17/17; in general improved. He still has 3 small open areas on the lateral aspect of his left leg however most of the area on the posterior part of his leg is epithelialized. He has better edema control. He has an ABD pad under his stocking on the right anterior lower leg although he did not let us look at that today. 03/24/17; patient arrives back in clinic today with no open areas however there are areas on the posterior left calf and anterior left calf that are less than 100% epithelialized. His edema is well controlled in the left lower leg. There is some pitting edema probably lymphedema in the left upper thigh. He uses compression pumps at home once per day. I tried to get him to do this twice a day although he is very  reticent. 04/01/2017 -- for the last 2 days he's had significant redness, tenderness and weeping and came in for an urgent visit today. 04/07/17; patient still has 6 more days of doxycycline. He was seen by Dr. Con Memos last Wednesday for cellulitis involving the posterior aspect, lateral aspect of his Involving his heel. For the most part he is better there is less erythema and less weeping. He has been on his feet for 12 hours 2 over the weekend. Using his compression pumps once a day 04/14/17 arrives today with continued improvement. Only one area on the posterior left calf that is not fully epithelialized. He has intense bilateral venous inflammation associated with his chronic venous insufficiency disease and secondary lymphedema. We have been using silver alginate to the left posterior calf wound In passing he tells [place] today that the right leg but we have not seen in quite some time has an open area on it but he doesn't want [place] to look at this today states he will show this to [place] next week. 04/21/17; there is no open area on his left leg although he still reports some weeping edema. He showed [place] his right leg today which is the first time we've seen this leg in a long time. He has a large area of open wound on the right leg anteriorly healthy granulation. Quite a bit of swelling in the right leg and some degree of venous inflammation. He told us about the right leg in passing last week but states that deterioration in the right leg really only happened over the weekend 04/28/17; there is no open area on the left leg although there is an irritated part on the posterior which is like a wrap injury. The wound on the right leg which was new from last week at least to [place] is a lot better. 05/05/17; still no open area on the left leg. Patient is using his new compression stocking which seems to be doing a good job of controlling the edema. He states he is using his compression pumps once per day. The  right leg still has an open wound although it is better in terms of surface area. Required debridement. A lot of pain in the posterior right Achilles marked tenderness. Usually this type of presentation this patient gives concern for an active cellulitis 05/12/17; patient arrives today with his major wound from last week on the right lateral leg somewhat better. Still requiring debridement. He was using his compression stocking on the left leg however that is reopened with superficial wounds anteriorly he did not have an open wound on this leg  previously. He is still using his juxta light's once daily at night. He cannot find the time to do this in the morning as he has to be at work by 7 AM 05/19/17; right lateral leg wound looks improved. No debridement required. The concerning area is on the left posterior leg which appears to almost have a subcutaneous hemorrhagic component to it. We've been using silver alginate to all the wounds 05/26/17; the right lateral leg wound continues to look improved. However the area on the left posterior calf is a tightly adherent surface. [place] using silver alginate. Because of the weeping edema in his legs there is very little good alternatives. 06/02/17; the patient left here last week looking quite good. Major wound on the left posterior calf and a small one on the right lateral calf. Both of these look satisfactory. He tells me that by Wednesday he had noted increased pain in the left leg and drainage. He called on Thursday and Friday to get an appointment here but we were blocked. He did not go to urgent care or his primary physician. He thinks he had a fever on Thursday but did not actually take his temperature. He has not been using his compression pumps on the left leg because of pain. I advised him to go to the emergency room today for IV antibiotics for stents of left leg cellulitis but he has refused I have asked him to take 2 days off work to keep his  leg elevated and he has refused this as well. In view of this I'm going to call him and Augmentin and doxycycline. 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 However after a detailed discussion regarding the risk benefits and alternatives he refuses to get admitted to the hospital. With no other recourse I will continue him on oral antibiotics as before and hopefully he'll have his infectious disease consultation this week. 06/16/2017 -- the patient was seen today by the nurse practitioner at infectious disease Ms. Dixon. 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 He has also been advise Lotrimin cream locally. she also had wise IV antibiotics for his condition if it flares up 06/23/17; patient arrives today with drainage bilaterally although the remaining wound on the left posterior calf after cleaning up today "highlighter yellow drainage" did not look too bad. Unfortunately he has had breakdown on the right anterior leg [previously this leg had not been open and he is using a black stocking] he went to see infectious disease and is been put on clindamycin 150 mg daily, I did not verify the dose although I'm not familiar with using clindamycin in this dosing range, perhaps for prophylaxisoo 06/27/17; I brought this patient back today to follow-up on the wound deterioration on the right lower leg together with surrounding cellulitis. I started him on doxycycline 4 days ago. This area looks better however he comes in today with intense cellulitis on the medial part of his left  thigh. This is not have a wound in this area. Extremely tender. We've been using silver alginate to  the wounds on the right lower leg left lower leg with bilateral 4 layer compression he is using his external compression pumps once a day 07/04/17; patient's left medial thigh cellulitis looks better. He has not been using his compression pumps as his insert said it was contraindicated with cellulitis. His right leg continues to make improvements all the wounds are still open. We only have one remaining wound on the left posterior calf. Using silver alginate to all open areas. He is on doxycycline which I started a week ago and should be finishing I gave him Augmentin after Thursday's visit for the severe cellulitis on the left medial thigh which fortunately looks better 07/14/17; the patient's left medial thigh cellulitis has resolved. The cellulitis in his right lower calf on the right also looks better. All of his wounds are stable to improved we've been using silver alginate he has completed the antibiotics I have given him. He has clindamycin 150 mg once a day prescribed by infectious disease for prophylaxis, I've advised him to start this now. We have been using bilateral Unna boots over silver alginate to the wound areas 07/21/17; the patient is been to see infectious disease who noted his recurrent problems with cellulitis. He was not able to tolerate prophylactic clindamycin therefore he is on amoxicillin 500 twice a day. He also had a second daily dose of Lasix added By Dr. Oneta Rack but he is not taking this. Nor is he being completely compliant with his compression pumps a especially not this week. He has 2 remaining wounds one on the right posterior lateral lower leg and one on the left posterior medial lower leg. 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 The patient has Unna boots bilaterally. Still wounds on his  right lateral, left medial, and a new open area on the left anterior lateral lower leg 08/04/17; he remains on amoxicillin twice a day for prophylaxis of recurrent cellulitis. He has bilateral Unna boots for compression and silver alginate to his wounds. Arrives today with his legs looking as good as I have seen him in quite some time. Not surprisingly his wounds look better as well with improvement on the right lateral leg venous insufficiency wound and also the left medial leg. He is still using the compression pumps once a day 08/11/17; both legs appear to be doing better wounds on the right lateral and left medial legs look better. Skin on the right leg quite good. He is been using silver alginate as the primary dressing. I'm going to use Anasept gel calcium alginate and maintain all the secondary dressings 08/18/17; the patient continues to actually do quite well. The area on his right lateral leg is just about closed the left medial also looks better although it is still moist in this area. His edema is well controlled we have been using Anasept gel with calcium alginate and the usual secondary dressings, 4 layer compression and once daily use of his compression pumps "always been able to manage 09/01/17; the patient continues to do reasonably well in spite of his trip to [place]. The area on the right lateral leg is epithelialized. Left is much better but still open. He has more edema and more chronic erythema on the left leg [venous inflammation] 09/08/17; he arrives today with no open wound on the right lateral leg and decently controlled edema. Unfortunately his left leg is not nearly as in his good situation as last  week.he apparently had increasing edema starting on Saturday. He edema soaked through into his foot so used a plastic bag to walk around his home. The area on the medial right leg which was his open area is about the same however he has lost surface epithelium on the  left lateral which is new and he has significant pain in the Achilles area of the left foot. He is already on amoxicillin chronically for prophylaxis of cellulitis in the left leg 09/15/17; he is completed a week of doxycycline and the cellulitis in the left posterior leg and Achilles area is as usual improved. He still has a lot of edema and fluid soaking through his dressings. There is no open wound on the right leg. He saw infectious disease NP today 09/22/17;As usual 1 we transition him from our compression wraps to his stockings things did not go well. He has several small open areas on the right leg. He states this was caused by the compression wrap on his skin although he did not wear this with the stockings over them. He has several superficial areas on the left leg medially laterally posteriorly. He does not have any evidence of active cellulitis especially involving the left Achilles The patient is traveling [facility] Saturday going to [facility]. He states he isn't attempting to get an appointment with a heel objects wound center there to change his dressings. I am not completely certain whether this will work 10/06/17; the patient came in on Friday for a nurse visit and the nurse reported that his legs actually look quite good. He arrives in clinic today for his regular follow-up visit. He has a new wound on his left third toe over the PIP probably caused by friction with his footwear. He has small areas on the left leg and a very superficial but epithelialized area on the right anterior lateral lower leg. Other than that his legs look as good as I've seen him in quite some time. We have been using silver alginate Review of systems; no chest pain no shortness of breath other than this a 10 point review of systems negative 10/20/17; seen by Dr. Meyer Russel last week. He had taken some antibiotics [doxycycline] that he had left over. Dr. Meyer Russel thought he had candida infection and  declined to give him further antibiotics. He has a small wound remaining on the right lateral leg several areas on the left leg including a larger area on the left posterior several left medial and anterior and a small wound on the left lateral. The area on the left dorsal third toe looks a lot better. ROS; Gen.; no fever, respiratory no cough no sputum Cardiac no chest pain other than this 10 point review of system is negative 10/30/17; patient arrives today having fallen in the bathtub 3 days ago. It took him a while to get up. He has pain and maceration in the wounds on his left leg which have deteriorated. He has not been using his pumps he also has some maceration on the right lateral leg. 11/03/17; patient continues to have weeping edema especially in the left leg. This saturates his dressings which were just put on on 12/27. As usual the doxycycline seems to take care of the cellulitis on his lower leg. He is not complaining of fever, chills, or other systemic symptoms. He states his leg feels a lot better on the doxycycline I gave him empirically. He also apparently gets injections at his primary doctor's officeo Rocephin for  cellulitis prophylaxis. I didn't ask him about his compression pump compliance today I think that's probably marginal. Arrives in the clinic with all of his dressings primary and secondary macerated full of fluid and he has bilateral edema 11/10/17; the patient's right leg looks some better although there is still a cluster of wounds on the right lateral. The left leg is inflamed with almost circumferential skin loss medially to laterally although we are still maintaining anteriorly. He does not have overt cellulitis there is a lot of drainage. He is not using compression pumps. We have been using silver alginate to the wound areas, there are not a lot of options here 11/17/17; the patient's right leg continues to be stable although there is still open wounds, better than  last week. The inflammation in the left leg is better. Still loss of surface layer epithelium especially posteriorly. There is no overt cellulitis in the amount of edema and his left leg is really quite good, tells me he is using his compression pumps once a day. 11/24/17; patient's right leg has a small superficial wound laterally this continues to improve. The inflammation in the left leg is still improving however we have continuous surface layer epithelial loss posteriorly. There is no overt cellulitis in the amount of edema in both legs is really quite good. He states he is using his compression pumps on the left leg once a day for 5 out of 7 days 12/01/17; very small superficial areas on the right lateral leg continue to improve. Edema control in both legs is better today. He has continued loss of surface epithelialization and left posterior calf although I think this is better. We have been using silver alginate with large number of absorptive secondary dressings 4 layer on the left Unna boot on the right at his request. He tells me he is using his compression pumps once a day 12/08/17; he has no open area on the right leg is edema control is good here. On the left leg however he has marked erythema and tenderness breakdown of skin. He has what appears to be a wrap injury just distal to the popliteal fossa. 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 There was swelling erythema and breakdown in the right leg. He has much worse skin breakdown in the left leg as well multiple open areas medially and posteriorly as well as laterally. He tells me he has been using his compression pumps but tells me he feels that the  drainage out of his leg is worse when he uses a compression pumps. To be fair to him he is been saying this for a while however I don't know that I have really been listening to this. I wonder if the compression pumps are working properly 12/22/17;. Once again he arrives with severe erythema, weeping edema from the left greater than right leg. Noncompliance with compression pumps. New this visit he is complaining of pain on the lateral aspect of the right leg and the medial aspect of his right thigh. He apparently saw his cardiologist Dr. [name] who was ordered an echocardiogram area and I think this is a step in the right direction 12/25/17; started his doxycycline Monday night. There is still intense erythema  of the right leg especially in the anterior thigh although there is less tenderness. The erythema around the wound on the right lateral calf also is less tender. He still complaining of pain in the left heel. His wounds are about the same right lateral left medial left lateral. Superficial but certainly not close to closure. He denies being systemically unwell no fever chills no abdominal pain no diarrhea 12/29/17; back in follow-up of his extensive right calf and right thigh cellulitis. I added amoxicillin to cover possible doxycycline resistant strep. This seems to of done the trick he is in much less pain there is much less erythema and swelling. He has his echocardiogram at 11:00 this morning. X-ray of the left heel was also negative. 01/05/18; the patient arrived with his edema under much better control. Now that he is retired he is able to use his compression pumps daily and sometimes twice a day per the patient. He has a wound on the right leg the lateral wound looks better. Area on the left leg also looks a lot better. He has no evidence of cellulitis in his bilateral thighs I had a quick peak at his echocardiogram. He is in normal ejection fraction and normal left ventricular function.  He has moderate pulmonary hypertension moderately reduced right ventricular function. One would have to wonder about chronic sleep apnea although he says he doesn't snore. He'll review the echocardiogram with his cardiologist. 01/12/18; the patient arrives with the edema in both legs under exemplary control. He is using his compression pumps daily and sometimes twice daily. His wound on the right lateral leg is just about closed. He still has some weeping areas on the posterior left calf and lateral left calf although everything is just about closed here as well. I have spoken with Aldean Baker who is the patient's nurse practitioner and infectious disease. She was concerned that the patient had not understood that the parenteral penicillin injections he was receiving for cellulitis prophylaxis was actually benefiting him. I don't think the patient actually saw that I would tend to agree we were certainly dealing with less infections although he had a serious one last month. 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 He is healed. He'll be placed in juxtalite compression wraps and increase his lymphedema pumps to twice daily. We will follow up again next week to ensure there are no issues with the new regiment. 01/20/18-he is here for evaluation of bilateral lower extremity weeping edema. Yesterday he was placed in compression wrap to the right lower extremity and compression stocking to left lower shrubbery. He states he uses lymphedema pumps last night and again this morning and noted a blister to the left lower extremity. On exam he was noted to have drainage to the right lower extremity. He will be placed in Unna boots bilaterally and follow-up next week 01/26/18; patient was actually discharged a week ago to his own juxta light stockings only to return the next day with bilateral lower extremity weeping edema.he was placed in bilateral Unna boots. He  arrives today with pain in the back of his left leg. There is no open area on the right leg however there is a linear/wrap injury on the left leg and weeping edema on the left leg posteriorly. I spoke with infectious disease about 10 days ago. They were disappointed that the patient elected to discontinue prophylactic intramuscular penicillin shots as they felt it was particularly beneficial in reducing the frequency of  his cellulitis. I discussed this with the patient today. He does not share this view. He'll definitely need antibiotics today. Finally he is traveling to [place] and trauma leaving this Saturday and returning a week later and he does not travel with his pumps. He is going by car 01/30/18; patient was seen 4 days ago and brought back in today for review of cellulitis in the left leg posteriorly. I put him on amoxicillin this really hasn't helped as much as I might like. He is also worried because he is traveling to [facility] trauma by car. Finally we will be rewrapping him. There is no open area on the right leg over his left leg has multiple weeping areas as usual 02/09/18; The same wrap on for 10 days. He did not pick up the last doxycycline I prescribed for him. He apparently took 4 days worth he already had. There is nothing open on his right leg and the edema control is really quite good. He's had damage in the left leg medially and laterally especially probably related to the prolonged use of Unna boots 02/12/18; the patient arrived in clinic today for a nurse visit/wrap change. He complained of a lot of pain in the left posterior calf. He is taking doxycycline that I previously prescribed for him. Unfortunately even though he used his stockings and apparently used to compression pumps twice a day he has weeping edema coming out of the lateral part of his right leg. This is coming from the lower anterior lateral skin area. 02/16/18; the patient has finished his doxycycline and  will finish the amoxicillin 2 days. The area of cellulitis in the left calf posteriorly has resolved. He is no longer having any pain. He tells me he is using his compression pumps at least once a day sometimes twice. 02/23/18; the patient finished his doxycycline and Amoxil last week. On Friday he noticed a small erythematous circle about the size of a quarter on the left lower leg just above his ankle. This rapidly expanded and he now has erythema on the lateral and posterior part of the thigh. This is bright red. Also has an area on the dorsal foot just above his toes and a tender area just below the left popliteal fossa. He came off his prophylactic penicillin injections at his own insistence one or 2 months ago. This is obviously deteriorated since then 03/02/18; patient is on doxycycline and Amoxil. Culture I did last week of the weeping area on the back of his left calf grew group B strep. I have therefore renewed the amoxicillin 500 3 times a day for a further week. He has not been systemically unwell. Still complaining of an area of discomfort right under his left popliteal fossa. There is no open wound on the right leg. He tells me that he is using his pumps twice a day on most days 03/09/18; patient arrives in clinic today completing his amoxicillin today. The cellulitis on his left leg is better. Furthermore he tells me that he had intramuscular penicillin shots that his primary care office today. However he also states that the wrap on his right leg fell down shortly after leaving clinic last week. He developed a large blister that was present when he came in for a nurse visit later in the week and then he developed intense discomfort around this area.He tells me he is using his compression pumps 03/16/18; the patient has completed his doxycycline. The infectious part of this/cellulitis in the left heel  area left popliteal area is a lot better. He has 2 open areas on the right calf. Still  areas on the left calf but this is a lot better as well. 03/24/18; the patient arrives complaining of pain in the left popliteal area again. He thinks some of this is wrap injury. He has no open area on the right leg and really no open area on the left calf either except for the popliteal area. He claims to be compliant with the compression pumps 03/31/18; I gave him doxycycline last week because of cellulitis in the left popliteal area. This is a lot better although the surface epithelium is denuded off and response to this. He arrives today with uncontrolled edema in the right calf area as well as a fingernail injury in the right lateral calf. There is only a few open areas on the left 04/06/18; I gave him amoxicillin doxycycline over the last 2 [name] that the amoxicillin should be completing currently. He is not complaining of any pain or systemic symptoms. The only open areas see has is on the right lateral lower leg paradoxically I cannot see anything on the left lower leg. He tells me he is using his compression pumps twice a day on most days. Silver alginate to the wounds that are open under 4 layer compression 04/13/18; he completed antibiotics and has no new complaints. Using his compression pumps. Silver alginate that anything that's opened 04/20/18; he is using his compression pumps religiously. Silver alginate 4 layer compression anything that's opened. He comes in today with no open wounds on the left leg but 3 on the right including a new one posteriorly. He has 2 on the right lateral and one on the right posterior. He likes Unna boots on the right leg for reasons that aren't really clear we had the usual 4 layer compression on the left. It may be necessary to move to the 4 layer compression on the right however for now I left them in the Unna boots 04/27/18; he is using his compression pumps at least once a day. He has still the wounds on the right lateral calf. The area right posteriorly  has closed. He does not have an open wound on the left under 4 layer compression however on the dorsal left foot just proximal to the toes and the left third toe 2 small open areas were identified 05/11/18; he has not uses compression pumps. The areas on the right lateral calf have coalesced into one large wound necrotic surface. On the left side he has one small wound anteriorly however the edema is now weeping out of a large part of his left leg. He says he wasn't using his pumps because of the weeping fluid. I explained to him that this is the time he needs to pump more 05/18/18; patient states he is using his compression pumps twice a day. The area on the right lateral large wound albeit superficial. On the left side he has innumerable number of small new wounds on the left calf particularly laterally but several anteriorly and medially. All these appear to have healthy granulated base these look like the remnants of blisters however they occurred under compression. The patient arrives in clinic today with his legs somewhat better. There is certainly less edema, less multiple open areas on the left calf and the right anterior leg looks somewhat better as well superficial and a little smaller. However he relates pain and erythema over the last 3-4 days in the  thigh and I looked at this today. He has not been systemically unwell no fever no chills no change in blood sugar values 05/25/18; comes in today in a better state. The severe cellulitis on his left leg seems better with the Keflex. Not as tender. He has not been systemically unwell Hard to find an open wound on the left lower leg using his compression pumps twice a day The confluent wounds on his right lateral calf somewhat better looking. These will ultimately need debridement I didn't do this today. 06/01/18; the severe cellulitis on the left anterior thigh has resolved and he is completed his Keflex. There is no open wound on the left leg  however there is a superficial excoriation at the base of the third toe dorsally. Skin on the bottom of his left foot is macerated looking. The left the wounds on the lateral right leg actually looks some better although he did require debridement of the top half of this wound area with an open curet 06/09/18 on evaluation today patient appears to be doing poorly in regard to his right lower extremity in particular this appears to likely be infected he has very thick purulent discharge along with a bright green tent to the discharge. This makes me concerned about the possibility of pseudomonas. He's also having increased discomfort at this point on evaluation. Fortunately there does not appear to be any evidence of infection spreading to the other location at this time. 06/16/18 on evaluation today patient appears to actually be doing fairly well. His ulcer has actually diminished in size quite significantly at this point which is good news. Nonetheless he still does have some evidence of infection he did see infectious disease this morning before coming here for his appointment. I did review the results of their evaluation and their note today. They did actually have him discontinue the Cipro and initiate treatment with linezolid at this time. He is doing this for the next seven days and they recommended a follow-up in four months with them. He is the keep a log of the need for intermittent antibiotic therapy between now and when he falls back up with infectious disease. This will help them gaze what exactly they need to do to try and help them out. 06/23/18; the patient arrives today with no open wounds on the left leg and left third toe healed. He is been using his compression pumps twice a day. On the right lateral leg he still has a sizable wound but this is a lot better than last time I saw this. In my absence he apparently cultured MRSA coming from this wound and is completed a course of  linezolid as has been directed by infectious disease. Has been using silver alginate under 4 layer compression 06/30/18; the only open wound he has is on the right lateral leg and this looks healthy. No debridement is required. We have been using silver alginate. He does not have an open wound on the left leg. There is apparently some drainage from the dorsal proximal third toe on the left although I see no open wound here. 07/03/18 on evaluation today patient was actually here just for a nurse visit rapid change. However when he was here on Wednesday for his rat change due to having been healed on the left and then developing blisters we initiated the wrap again knowing that he would be back today for [place] to reevaluate and see were at. Unfortunately he has developed some cellulitis into the  proximal portion of his right lower extremity even into the region of his thigh. 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 He was placed on one as what at that point. Nonetheless he is done with that and has been tolerating it well otherwise. Doxycycline which in the past really did not seem to be effective for him. Nonetheless I think the best option may be for [place] to definitely reinitiate the antibiotics for a longer period of time. 07/07/18; since I last saw this patient a week ago he has had a difficult time. At that point he did not have an open wound on his left leg. We transitioned him into juxta light stockings. He was apparently in the clinic the next day with blisters on the left lateral and left medial lower calf. He also had weeping edema fluid. He was put back into a compression wrap. He was also in the clinic on Friday with intense erythema in his right thigh. Per the patient he was started on Bactrim however that didn't work at all in terms of relieving his pain and swelling. He has taken 3 doxycycline that he had left over from last time and that seems to of  helped. He has blistering on the right thigh as well. 07/14/18; the erythema on his right thigh has gotten better with doxycycline that he is finishing. 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 Presumably this cellulitis in the thigh was not related to that although I think this is a bit concerning going forward. He still has an area on the right lateral calf the blister on the right medial calf just below the knee that was discussed above. On the left 2 small open areas left medial and left lateral. Edema control is adequate. He is using his compression pumps twice a day 07/20/18; continued improvement in the condition of both legs especially the edema in his bilateral thighs. He tells me he is been losing weight through a combination of diet and exercise. He is using his compression pumps twice a day. So overall she made to the remaining wounds 07/27/2018; continued improvement in condition of both legs. His edema is well controlled. The area on the right lateral leg is just about closed he had one blisters show up on the medial left upper calf. We have him in 4 layer compression. He is going on a 10-day trip to [place], [place] and [place]. He will be driving. He wants to wear Unna boots because of the lessening amount of constriction. He will not use compression pumps while he is away 08/05/18 on evaluation today patient actually appears to be doing decently well all things considered in regard to his bilateral lower extremities. The worst ulcer is actually only posterior aspect of his left lower extremity with a four layer compression wrap cut into his leg a couple [name] back. He did have a trip and actually had [employer] for the trip that he is worn since he was last here. Nonetheless he feels like the [employer] actually do better for him his swelling is up a little bit but he also with his trip was  not taking his Lasix on a regular set schedule like he was supposed to be. He states that obviously the reason being that he cannot drive and keep going without having to urinate too frequently  which makes it difficult. He did not have his pumps with him while he was away either which I think also maybe playing a role here too. 08/13/2018; the patient only has a small open wound on the right lateral calf which is a big improvement in the last month or 2. He also has the area posteriorly just below the posterior fossa on the left which I think was a wrap injury from several [name] ago. He has no current evidence of cellulitis. He tells me he is back into his compression pumps twice a day. He also tells me that while he was at the laundromat somebody stole a section of his extremitease stockings 08/20/2018; back in the clinic with a much improved state. He only has small areas on the right lateral mid calf which is just about healed. This was is more substantial area for quite a prolonged period of time. He has a small open area on the left anterior tibia. The area on the posterior calf just below the popliteal fossa is closed today. He is using his compression pumps twice a day 08/28/2018; patient has no open wound on the right leg. He has a smattering of open areas on the calf with some weeping lymphedema. More problematically than that it looks as though his wraps of slipped down in his usual he has very angry upper area of edema just below the right medial knee and on the right lateral calf. He has no open area on his feet. The patient is traveling to [facility] next week. I will send him in an antibiotic. We will continue to wrap the right leg. We ordered extremitease stockings for him last week and I plan to transition the right leg to a stocking when he gets home which will be in 10 days time. As usual he is very reluctant to take his pumps with him when he travels 09/07/2018; patient  returns from [facility]. He shows me a picture of his left leg in the mid part of his trip last week with intense fire engine erythema. The picture look bad enough I would have considered sending him to the hospital. Instead he went to the wound care center in [facility] [place]. They did not prescribe him antibiotics but he did take some doxycycline he had leftover from a previous visit. I had given him trimethoprim sulfamethoxazole before he left this did not work according to the patient. This is resulted in some improvement fortunately. He comes back with a large wound on the left posterior calf. Smaller area on the left anterior tibia. Denuded blisters on the dorsal left foot over his toes. Does not have much in the way of wounds on the right leg although he does have a very tender area on the right posterior area just below the popliteal fossa also suggestive of infection. He promises me he is back on his pumps twice a day 09/15/2018; the intense cellulitis in his left lower calf is a lot better. The wound area on the posterior left calf is also so better. However he has reasonably extensive wounds on the dorsal aspect of his second and third toes and the proximal foot just at the base of the toes. There is nothing open on the right leg 09/22/2018; the patient has excellent edema control in his legs bilaterally. He is using his external compression pumps twice a day. He has no open area on the right leg and only the areas in the left foot dorsally  second and third toe area on the left side. He does not have any signs of active cellulitis. 10/06/2018; the patient has good edema control bilaterally. He has no open wound on the right leg. There is a blister in the posterior aspect of his left calf that we had to deal with today. He is using his compression pumps twice a day. There is no signs of active cellulitis. We have been using silver alginate to the wound areas. He still  has vulnerable areas on the base of his left first second toes dorsally He has a his extremities stockings and we are going to transition him today into the stocking on the right leg. He is cautioned that he will need to continue to use the compression pumps twice a day. If he notices uncontrolled edema in the right leg he may need to go to 3 times a day. 10/13/2018; the patient came in for a nurse check on Friday he has a large flaccid blister on the right medial calf just below the knee. We unroofed this. He has this and a new area underneath the posterior mid calf which was undoubtedly a blister as well. He also has several small areas on the right which is the area we put his extremities stocking on. 10/19/2018; the patient went to see infectious disease this morning I am not sure if that was a routine follow-up in any case the doxycycline I had given him was discontinued and started on linezolid. He has not started this. It is easy to look at his left calf and the inflammation and think this is cellulitis however he is very tender in the tissue just below the popliteal fossa and I have no doubt that there is infection going on here. He states the problem he is having is that with the compression pumps the edema goes down and then starts walking the wrap falls down. We will see if we can adhere this. He has 1 or 2 minuscule open areas on the right still areas that are weeping on the posterior left calf, the base of his left second and third toes 10/26/18; back today in clinic with quite of skin breakdown in his left anterior leg. This may have been infection the area below the popliteal fossa seems a lot better however tremendous epithelial loss on the left anterior mid tibia area over quite inexpensive tissue. He has 2 blisters on the right side but no other open wound here. 10/29/2018; came in urgently to see [place] today and we worked him in for review. He states that the 4 layer compression  on the right leg caused pain he had to cut it down to roughly his mid calf this caused swelling above the wrap and he has blisters and skin breakdown today. As a result of the pain he has not been using his pumps. Both legs are a lot more edematous and there is a lot of weeping fluid. 11/02/18; arrives in clinic with continued difficulties in the right leg> left. Leg is swollen and painful. multiple skin blisters and new open areas especially laterally. He has not been using his pumps on the right leg. 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 He is not systemically unwell. 11/09/2018; the patient claims he is being compliant with his pumps. He is finished the doxycycline I gave him last week. Culture I did of the wound on the right lateral leg showed a few very resistant methicillin  staph aureus. This was resistant to doxycycline. Nevertheless he states the pain in the leg is a lot better which makes me wonder if the cultured organism was not really what was causing the problem nevertheless this is a very dangerous organism to be culturing out of any wound. His right leg is still a lot larger than the left. He is using an Haematologist on this area, he blames a 4-layer compression for causing the original skin breakdown which I doubt is true however I cannot talk him out of it. We have been using silver alginate to all of these areas which were initially blisters 11/16/2018; patient is being compliant with his external compression pumps at twice a day. Miraculously he arrives in clinic today with absolutely no open wounds. He has better edema control on the left where he has been using 4 layer compression versus wound of wounds on the right and I pointed this out to him. There is no inflammation in the skin in his lower legs which is also somewhat unusual for him. There is no open wounds on the dorsal left foot. He has extremitease stockings at home and I  have asked him to bring these in next week. 11/25/18 patient's lower extremity on examination today on the left appears for the most part to be wound free. He does have an open wound on the lateral aspect of the right lower extremity but this is minimal compared to what I've seen in past. He does request that we go ahead and wrap the left leg as well even though there's nothing open just so hopefully it will not reopen in short order. 1/28; patient has superficial open wounds on the right lateral calf left anterior calf and left posterior calf. His edema control is adequate. 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 We have been using silver alginate as the primary dressing. He claims compliance with his compression pumps 2/4; patient has superficial open wounds on numerous areas of his left calf and again one on the left dorsal foot. The areas on the right lateral calf have healed. The cellulitis that I gave him doxycycline for last week is also resolved this was mostly on the left anterior calf just below the tibial tuberosity. His edema looks fairly well-controlled. He tells me he went to see his primary doctor today and had blood work ordered 2/11; once again he has several open areas on the left calf left tibial area. Most of these are small and appear to have healthy granulation. He does not have anything open on the right. The edema and control in his thighs is pretty good which is usually a good indication he has been using his pumps as requested. 2/18; he continues to have several small areas on the left calf and left tibial area. Most of these are small healthy granulation. We put him in his stocking on the right leg last week and he arrives with a superficial open area over the right upper tibia and a fairly large area on the right lateral tibia in similar condition. His edema control actually does not  look too bad, he claims to be using his compression pumps twice a day 2/25. Continued small areas on the left calf and left tibial area. New areas especially on the right are identified just below the tibial tuberosity and on the right upper tibia itself. There are also areas of  weeping edema fluid even without an obvious wound. He does not have a considerable degree of lymphedema but clearly there is more edema here than his skin can handle. He states he is using the pumps twice a day. We have an Unna boot on the right and 4 layer compression on the left. 3/3; he continues to have an area on the right lateral calf and right posterior calf just below the popliteal fossa. There is a fair amount of tenderness around the wound on the popliteal fossa but I did not see any evidence of cellulitis, could just be that the wrap came down and rubbed in this area. He does not have an open area on the left leg however there is an area on the left dorsal foot at the base of the third toe We have been using silver alginate to all wound areas 3/10; he did not have an open area on his left leg last time he was here a week ago. Today he arrives with a horizontal wound just below the tibial tuberosity and an area on the left lateral calf. He has intense erythema and tenderness in this area. The area is on the right lateral calf and right posterior calf better than last week. We have been using silver alginate as usual 3/18 - Patient returns with 3 small open areas on left calf, and 1 small open area on right calf, the skin looks ok with no significant erythema, he continues the UNA boot on right and 4 layer compression on left. The right lateral calf wound is closed , the right posterior is small area. we will continue silver alginate to the areas. Culture results from right posterior calf wound is + MRSA sensitive to Bactrim but resistant to DOXY 01/27/19 on evaluation today patient's bilateral lower extremities  actually appear to be doing fairly well at this point which is good news. He is been tolerating the dressing changes without complication. Fortunately she has made excellent improvement in regard to the overall status of his wounds. Unfortunately every time we cease wrapping him he ends up reopening in causing more significant issues at that point. Again I'm unsure of the best direction to take although I think the lymphedema clinic may be appropriate for him. 02/03/19 on evaluation today patient appears to be doing well in regard to the wounds that we saw him for last week unfortunately he has a new area on the proximal portion of his right medial/posterior lower extremity where the wrap somewhat slowed down and caused swelling and a blister to rub and open. Unfortunately this is the only opening that he has on either leg at this point. 02/17/19 on evaluation today patient's bilateral lower extremities appear to be doing well. He still completely healed in regard to the left lower extremity. In regard to the right lower extremity the area where the wrap and slid down and caused the blister still seems to be slightly open although this is dramatically better than during the last evaluation two [name] ago. I'm very pleased with the way this stands overall. 03/03/19 on evaluation today patient appears to be doing well in regard to his right lower extremity in general although he did have a new blister open this does not appear to be showing any evidence of active infection at this time. Fortunately there's No fevers, chills, nausea, or vomiting noted at this time. Overall I feel like he is making good progress it does feel like that the right leg will  we perform the [employer] seems to do with a bit better than three layer wrap on the left which slid down on him. We may switch to doing bilateral in the book wraps. 5/4; I have not seen Mr. Garriga in quite some time. According to our case manager he did not  have an open wound on his left leg last week. He had 1 remaining wound on the right posterior medial calf. He arrives today with multiple openings on the left leg probably were blisters and/or wrap injuries from Unna boots. I do not think the Unna boot's will provide adequate compression on the left. I am also not clear about the frequency he is using the compression pumps. 03/17/19 on evaluation today patient appears to be doing excellent in regard to his lower extremities compared to last week's evaluation apparently. He had gotten significantly worse last week which is unfortunate. The [employer] wrap on the left did not seem to do very well for him at all and in fact it didn't control his swelling significantly enough he had an additional outbreak. Subsequently we go back to the four layer compression wrap on the left. This is good news. At least in that he is doing better and the wound seem to be killing him. He still has not heard anything from the lymphedema clinic. 03/24/19 on evaluation today patient actually appears to be doing much better in regard to his bilateral lower Trinity as compared to last week when I saw him. Fortunately there's no signs of active infection at this time. He has been tolerating the dressing changes without complication. Overall I'm extremely pleased with the progress and appearance in general. 04/07/19 on evaluation today patient appears to be doing well in regard to his bilateral lower extremities. His swelling is significantly down from where it was previous. With that being said he does have a couple blisters still open at this point but fortunately nothing that seems to be too severe and again the majority of the larger openings has healed at this time. 04/14/19 on evaluation today patient actually appears to be doing quite well in regard to his bilateral lower extremities in fact I'm not even sure there's anything significantly open at this time at any site.  Nonetheless he did have some trouble with these wraps where they are somewhat irritating him secondary to the fact that he has noted that the graph wasn't too close down to the end of this foot in a little bit short as well up to his knee. Otherwise things seem to be doing quite well. 04/21/19 upon evaluation today patient's wound bed actually showed evidence of being completely healed in regard to both lower extremities which is excellent news. There does not appear to be any signs of active infection which is also good news. I'm very pleased in this regard. No fevers, chills, nausea, or vomiting noted at this time. 04/28/19 on evaluation today patient appears to be doing a little bit worse in regard to both lower extremities on the left mainly due to the fact that when he went infection disease the wrap was not wrapped quite high enough he developed a blister above this. On the right he is a small open area of nothing too significant but again this is continuing to give him some trouble he has been were in the Velcro compression that he has at home. 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 He's  been tolerating the dressing changes without complication. Fortunately there's no signs of active infection at this time. No fevers, chills, nausea, or vomiting noted at this time. We have been trying to get an appointment with her lymphedema clinic in [facility] but unfortunately nobody can get them on phone with not been able to even fax information over the patient likewise is not been able to get in touch with them. Overall I'm not sure exactly what's going on here with to reach out again today. 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 Still having a lot of drainage unfortunately. He tells me especially in the left but even on the right.  There's no signs of active infection which is good news we've been using so ratcheted up to this point. 05/19/19 on evaluation today patient actually appears to be doing quite well with regard to his left lower extremity which is great news. Fortunately in regard to the right lower extremity has an issues with his wrap and he subsequently did remove this from what I'm understanding. Nonetheless long story short is what he had rewrapped once he removed it subsequently had maggots underneath this wrap whenever he came in for evaluation today. With that being said they were obviously completely cleaned away by the nursing staff. The visit today which is excellent news. However he does appear to potentially have some infection around the right ankle region where the maggots were located as well. He will likely require anabiotic therapy today. 05/26/19 on evaluation today patient actually appears to be doing much better in regard to his bilateral lower extremities. I feel like the infection is under much better control. With that being said there were maggots noted when the wrap was removed yet again today. Again this could have potentially been left over from previous although at this time there does not appear to be any signs of significant drainage there was obviously on the wrap some drainage as well this contracted gnats or otherwise. Either way I do not see anything that appears to be doing worse in my pinion and in fact I think his drainage has slowed down quite significantly likely mainly due to the fact to his infection being under better control. 06/02/2019 on evaluation today patient actually appears to be doing well with regard to his bilateral lower extremities there is no signs of active infection at this time which is great news. With that being said he does have several open areas more so on the right than the left but nonetheless these are all significantly better than previously  noted. 06/09/2019 on evaluation today patient actually appears to be doing well. His wrap stayed up and he did not cause any problems he had more drainage on the right compared to the left but overall I do not see any major issues at this time which is great news. 06/16/2019 on evaluation today patient appears to be doing excellent with regard to his lower extremities the only area that is open is a new blister that can have opened as of today on the medial ankle on the left. Other than this he really seems to be doing great I see no major issues at this point. 06/23/2019 on evaluation today patient appears to be doing quite well with regard to his bilateral lower extremities. In fact he actually appears to be almost completely healed there is a small area of weeping noted  of the right lower extremity just above the ankle. Nonetheless fortunately there is no signs of active infection at this time which is good news. No fevers, chills, nausea, vomiting, or diarrhea. 8/24; the patient arrived for a nurse visit today but complained of very significant pain in the left leg and therefore I was asked to look at this. Noted that he did not have an open area on the left leg last week nevertheless this was wrapped. The patient states that he is not been able to put his compression pumps on the left leg because of the discomfort. He has not been systemically unwell 06/30/2019 on evaluation today patient unfortunately despite being excellent last week is doing much worse with regard to his left lower extremity today. In fact he had to come in for a nurse on Monday where his left leg had to be rewrapped due to excessive weeping Dr. Leanord Hawking placed him on doxycycline at that point. Fortunately there is no signs of active infection Systemically at this time which is good news. 07/07/2019 in regard to the patient's wounds today he actually seems to be doing well with his right lower extremity there really is nothing  open or draining at this point this is great news. Unfortunately the left lower extremity is given him additional trouble at this time. There does not appear to be any signs of active infection nonetheless he does have a lot of edema and swelling noted at this point as well as blistering all of which has led to a much more poor appearing leg at this time compared to where it was 2 [name] ago when it was almost completely healed. Obviously this is a little discouraging for the patient. He is try to contact the [place]hed[place] he has not been able to get through to them. 07/14/2019 on evaluation today patient actually appears to be doing slightly better with regard to his left lower extremity ulcers. Overall I do feel like at least at the top of the wrap that we have been placing this area has healed quite nicely and looks much better. The remainder of the leg is showing signs of improvement. Unfortunately in the thigh area he still has an open region on the left and again on the right he has been utilizing just a Band-Aid on an area that also opened on the thigh. Again this is an area that were not able to wrap although we did do an Ace wrap to provide some compression that something that obviously is a little less effective than the compression wraps we have been using on the lower portion of the leg. He does have an appointment with the lymphedema clinic in [facility] on Friday. 07/21/2019 on evaluation today patient appears to be doing better with regard to his lower extremity ulcers. He has been tolerating the dressing changes without complication. Fortunately there is no signs of active infection at this time. No fevers, chills, nausea, vomiting, or diarrhea. I did receive the paperwork from the physical therapist at the lymphedema clinic in [place]. Subsequently I signed off on that this morning and sent that back to him for further progression with the treatment  plan. 07/28/2019 on evaluation today patient appears to be doing very well with regard to his right lower extremity where I do not see any open wounds at this point. Fortunately he is feeling great as far as that is concerned as well. In regard to the left lower extremity he has been having  issues with still several areas of weeping and edema although the upper leg is doing better his lower leg still I think is going require the compression wrap at this time. No fevers, chills, nausea, vomiting, or diarrhea. 08/04/2019 on evaluation today patient unfortunately is having new wounds on the right lower extremity. Again we have been using Unna boot wrap on that side. We switched him to using his juxta lite wrap at home. With that being said he tells me he has been using it although his legs extremely swollen and to be honest really does not appear that he has been. I cannot know that for sure however. Nonetheless he has multiple new wounds on the right lower extremity at this time. Obviously we will have to see about getting this rewrapped for him today. 08/11/2019 on evaluation today patient appears to be doing fairly well with regard to his wounds. He has been tolerating the dressing changes including the compression wraps without complication. He still has a lot of edema in his upper thigh regions bilaterally he is supposed to be seeing the lymphedema clinic on the 15th of this month once his wraps arrive for the upper part of his legs. 08/18/2019 on evaluation today patient appears to be doing well with regard to his bilateral lower extremities at this point. He has been tolerating the dressing changes without complication. Fortunately there is no signs of active infection which is also good news. He does have a couple weeping areas on the first and second toe of the right foot he also has just a small area on the left foot upper leg and a small area on the left lower leg but overall he is  doing quite well in my opinion. He is supposed to be getting his wraps shortly in fact tomorrow and then subsequently is seeing the lymphedema clinic next Wednesday on the 21st. Of note he is also leaving on the 25th to go on vacation for a week to the beach. For that reason and since there is some uncertainty about what there can be doing at lymphedema clinic next Wednesday I am get a make an appointment for next Friday here for [place] to see what we need to do for him prior to him leaving for vacation. 10/23; patient arrives in considerable pain predominantly in the upper posterior calf just distal to the popliteal fossa also in the wound anteriorly above the major wound. This is probably cellulitis and he has had this recurrently in the past. He has no open wound on the right side and he has had an Radio broadcast assistant in that area. Finally I note that he has an area on the left posterior calf which by enlarge is mostly epithelialized. This protrudes beyond the borders of the surrounding skin in the setting of dry scaly skin and lymphedema. The patient is leaving for [facility] on Sunday. Per his longstanding pattern, he will not take his compression pumps with him predominantly out of fear that they will be stolen. He therefore asked that we put a Unna boot back on the right leg. He will also contact the wound care center in [facility] to see if they can change his dressing in the mid week. 11/3; patient returned from his vacation to [facility]. He was seen on 1 occasion at their wound care center. They did a 2 layer compression system as they did not have our 4-layer wrap. I am not completely certain what they put on the wounds.  They did not change the Unna boot on the right. The patient is also seeing a lymphedema specialist physical therapist in [place]. It appears that he has some compression sleeve for his thighs which indeed look quite a bit better than I am used to seeing. He pumps over  these with his external compression pumps. 11/10; the patient has a new wound on the right medial thigh otherwise there is no open areas on the right. He has an area on the left leg posteriorly anteriorly and medially and an area over the left second toe. We have been using silver alginate. He thinks the injury on his thigh is secondary to friction from the compression sleeve he has. 11/17; the patient has a new wound on the right medial thigh last week. He thinks this is because he did not have a underlying stocking for his thigh juxta lite apparatus. He now has this. The area is fairly large and somewhat angry but I do not think he has underlying cellulitis. He has a intact blister on the right anterior tibial area. Small wound on the right great toe dorsally Small area on the medial left calf. 11/30; the patient does not have any open areas on his right leg and we did not take his juxta lite stocking off. However he states that on Friday his compression wrap fell down lodging around his upper mid calf area. As usual this creates a lot of problems for him. He called urgently today to be seen for a nurse visit however the nurse visit turned into a provider visit because of extreme erythema and pain in the left anterior tibia extending laterally and posteriorly. The area that is problematic is extensive Electronic Signature(s) Signed: 10/04/2019 6:46:53 PM By: Baltazar Najjar MD Entered By: Baltazar Najjar on 10/04/2019 18:16:21 -------------------------------------------------------------------------------- Physical Exam Details Patient Name: Date of Service: AMISH, MINTZER 10/04/2019 1:45 PM Medical Record [id number] Patient Account Number: [id number] Date of Birth/Sex: Treating RN: 07/27/1951 (69 y.o. [name] Primary Care Provider: Nicoletta Ba Other Clinician: Referring Provider: Treating Provider/Extender:Alleyne Lac, Lottie Rater, [name] in Treatment:  193 Constitutional Patient is hypertensive.. Pulse regular and within target range for patient.Marland Kitchen Respirations regular, non-labored and within target range.. Temperature is normal and within the target range for the patient.Marland Kitchen Appears in no distress. He does not appear to be systemically unwell. Eyes Conjunctivae clear. No discharge.no icterus. Respiratory work of breathing is normal. Bilateral breath sounds are clear and equal in all lobes with no wheezes, rales or rhonchi.. Cardiovascular No evidence of CHF. No increased jugular venous pressure. Pedal pulses palpable. Gastrointestinal (GI) Abdomen is soft and non-distended without masses or tenderness.. Integumentary (Hair, Skin) Areas of erythema and tenderness on the left anterior tibia left lateral calf marked with a marker.Marland Kitchen Psychiatric appears at normal baseline. Notes Wound exam We did not look at his right leg. On the left he has an area across the upper mid tibia of removal of skin, erythema and marked tenderness. On the lateral part of his calf there appears to be subcutaneous bogginess which is probably lymphedema fluid but I could not be certain about this. This area was also tender and marked superiorly. The area of tenderness extends posteriorly and almost a linear area where the wrap would have been. Electronic Signature(s) Signed: 10/04/2019 6:46:53 PM By: Baltazar Najjar MD Entered By: Baltazar Najjar on 10/04/2019 18:20:10 -------------------------------------------------------------------------------- Physician Orders Details Patient Name: Date of Service: REED, DADY 10/04/2019 1:45 PM Medical Record [id number] Patient Account Number:  [id number] Date of Birth/Sex: Treating RN: 05-31-1951 (68 y.o. 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 - both legs Skin Barriers/Peri-Wound Care TCA Cream or Ointment - mix with lotion both legs Wound Cleansing May shower with protection. Primary Wound Dressing Wound #145 Right,Medial Upper Leg Calcium Alginate with Silver Wound #146 Left Toe Second Calcium Alginate with Silver Wound #147 Left,Circumferential Lower Leg Calcium Alginate with Silver Secondary Dressing Wound #145 Right,Medial Upper Leg Foam Border Wound #146 Left Toe Second Kerlix/Rolled Gauze - secure with tape Dry Gauze Wound #147 Left,Circumferential Lower Leg ABD pad Kerramax - Zetuvit Edema Control 4 layer compression: Left lower extremity - unna boot at upper portion of lower leg. Unna Boot to Right Lower Extremity Avoid standing for long periods of time Elevate legs to the level of the heart or above for 30 minutes daily and/or when sitting, a frequency of: Exercise regularly Segmental Compressive Device. - lymphadema pumps 60 min 2 times per day Patient Medications Allergies: No Known Allergies Notifications Medication Indication Start End doxycycline monohydrate cellulites left leg 10/04/2019 DOSE oral 100 mg capsule - 1 capsule oral bid for 7 days Electronic Signature(s) Signed: 10/04/2019 3:21:59 PM By: Baltazar Najjar MD Entered By: Baltazar Najjar on 10/04/2019 15:21:49 -------------------------------------------------------------------------------- Problem List Details Patient Name: Date of Service: [name] 10/04/2019 1:45 PM Medical Record [id number] Patient Account Number: [id number] Date of Birth/Sex: Treating RN: 10-28-1951 (68 y.o. [name] Primary Care Provider: Nicoletta Ba Other Clinician: Referring Provider: Treating Provider/Extender:Dayshawn Irizarry, Lottie Rater, [name] in Treatment: [phone] Active Problems ICD-10 Evaluated Encounter Code Description Active Date Today Diagnosis L97.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 right calf limited to 06/30/2018 No Yes breakdown of skin L97.221 Non-pressure chronic ulcer of left calf limited to 09/30/2016 No Yes breakdown of skin I87.333 Chronic venous hypertension (idiopathic) with ulcer 01/22/2016 No Yes and inflammation of bilateral lower extremity I89.0 Lymphedema, not elsewhere classified 01/22/2016 No Yes E11.622 Type 2 diabetes mellitus with other skin ulcer 01/22/2016 No Yes 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, 01/22/2016 No Yes unspecified L03.116 Cellulitis of left lower limb 04/01/2017 No Yes Inactive Problems ICD-10 Code Description Active Date Inactive Date 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 06/30/2017 skin L97.521 Non-pressure chronic ulcer of other part of left foot limited to 04/27/2018 04/27/2018 breakdown of skin L03.115 Cellulitis of right lower limb 12/22/2017 12/22/2017 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 with other specified 06/30/2018 06/30/2018 severity L97.511 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 06/30/2018 06/30/2018 breakdown of skin Resolved Problems Electronic Signature(s) Signed: 10/04/2019 6:46:53 PM By: Baltazar Najjar MD Entered By: Baltazar Najjar on 10/04/2019  18:14:36 -------------------------------------------------------------------------------- Progress Note Details Patient Name: Date of Service: [name] 10/04/2019 1:45 PM Medical Record [id number] Patient Account Number: [id number] Date of Birth/Sex: Treating RN:  [birth year] (68 y.o. [name] Primary Care Provider: Nicoletta Ba Other Clinician: Referring Provider: Treating Provider/Extender:Demarion Pondexter, Lottie Rater, [name] in Treatment: 193 Subjective History of Present Illness (HPI) Referred by PCP for consultation. Patient has long standing history of BLE venous stasis, no prior ulcerations. At beginning of month, developed cellulitis and weeping. Received IM Rocephin followed by Keflex and resolved. Wears compression stocking, appr 6 months old. Not sure strength. No present drainage. 01/22/16 this is a patient who is a type II diabetic on insulin. 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 He tells me he religiously wears pressure stockings of uncertain strength. He was here with weeping edema about 8 months ago but did not have an open wound. Roughly a month ago he had a reopening on his bilateral legs. He is been using bandages and Neosporin. He does not complain of pain. 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 Last BUN/creatinine I have is from 11/20/15 at 13 and 1.0 respectively 01/29/16; patient arrives today having tolerated the Profore wrap. He brought in his stockings and these are 18 mmHg stockings he bought from [place]. The compression here is likely inadequate. He does not complain of pain or excessive drainage she has no systemic symptoms. The wound on the right looks improved as does the one on the left although one on the left is more substantial with still tissue at risk below the actual wound area on  the bilateral posterior calf 02/05/16; patient arrives with poor edema control. He states that we did put a 4 layer compression on it last week. No weight appear 5 this. 02/12/16; the area on the posterior right Has healed. The left Has a substantial wound that has necrotic surface eschar that requires a debridement with a curette. 02/16/16;the patient called or a Nurse visit secondary to increased swelling. He had been in earlier in the week with his right leg healed. He was transitioned to is on pressure stocking on the right leg with the only open wound on the left, a substantial area on the left posterior calf. Note he has a history of severe lower extremity edema, he has a history of chronic atrial fibrillation but not heart failure per my notes but I'll need to research this. He is not complaining of chest pain shortness of breath or orthopnea. The intake nurse noted blisters on the previously closed right leg 02/19/16; this is the patient's regular visit day. I see him on Friday with escalating edema new wounds on the right leg and clear signs of at least right ventricular heart failure. I increased his Lasix to 40 twice a day. He is returning currently in follow-up. States he is noticed a decrease in that the edema 02/26/16 patient's legs have much less edema. There is nothing really open on the right leg. The left leg has improved condition of the large superficial wound on the posterior left leg 03/04/16; edema control is very much better. The patient's right leg wounds have healed. On the left leg he continues to have severe venous inflammation on the posterior aspect of the left leg. There is no tenderness and I don't think any of this is cellulitis. 03/11/16; patient's right leg is married healed and he is in his own stocking. The patient's left leg has deteriorated somewhat. There is a lot of erythema around the wound on the posterior  left leg. There is also a significant rim of erythema  posteriorly just above where the wrap would've ended there is a new wound in this location and a lot of tenderness. Can't rule out cellulitis in this area. 03/15/16; patient's right leg remains healed and he is in his own stocking. The patient's left leg is much better than last review. His major wound on the posterior aspect of his left Is almost fully epithelialized. He has 3 small injuries from the wraps. Really. Erythema seems a lot better on antibiotics 03/18/16; right leg remains healed and he is in his own stocking. The patient's left leg is much better. The area on the posterior aspect of the left calf is fully epithelialized. His 3 small injuries which were wrap injuries on the left are improved only one seems still open his erythema has resolved 03/25/16; patient's right leg remains healed and he is in his own stocking. There is no open area today on the left leg posterior leg is completely closed up. His wrap injuries at the superior aspect of his leg are also resolved. He looks as though he has some irritation on the dorsal ankle but this is fully epithelialized without evidence of infection. 03/28/16; we discharged this patient on Monday. Transitioned him into his own stocking. There were problems almost immediately with uncontrolled swelling weeping edema multiple some of which have opened. He does not feel systemically unwell in particular no chest pain no shortness of breath and he does not feel 04/08/16; the edema is under better control with the Profore light wrap but he still has pitting edema. There is one large wound anteriorly 2 on the medial aspect of his left leg and 3 small areas on the superior posterior calf. Drainage is not excessive he is tolerating a Profore light well 04/15/16; put a Profore wrap on him last week. This is controlled is edema however he had a lot of pain on his left anterior foot most of his wounds are healed 04/22/16 once again the patient has denuded areas  on the left anterior foot which he states are because his wrap slips up word. He saw his primary physician today is on Lasix 40 twice a day and states that he his weight is down 20 pounds over the last 3 months. 04/29/16: Much improved. left anterior foot much improved. He is now on Lasix 80 mg per day. Much improved edema control 05/06/16; I was hoping to be able to discharge him today however once again he has blisters at a low level of where the compression was placed last week mostly on his left lateral but also his left medial leg and a small area on the anterior part of the left foot. 05/09/16; apparently the patient went home after his appointment on 7/4 later in the evening developing pain in his upper medial thigh together with subjective fever and chills although his temperature was not taken. The pain was so intense he felt he would probably have to call 911. However he then remembered that he had leftover doxycycline from a previous round of antibiotics and took these. By the next morning he felt a lot better. He called and spoke to one of our nurses and I approved doxycycline over the phone thinking that this was in relation to the wounds we had previously seen although they were definitely were not. The patient feels a lot better old fever no chills he is still working. Blood sugars are reasonably controlled 05/13/16; patient is  back in for review of his cellulitis on his anterior medial upper thigh. He is taking doxycycline this is a lot better. Culture I did of the nodular area on the dorsal aspect of his foot grew MRSA this also looks a lot better. 05/20/16; the patient is cellulitis on the medial upper thigh has resolved. All of his wound areas including the left anterior foot, areas on the medial aspect of the left calf and the lateral aspect of the calf at all resolved. He has a new blister on the left dorsal foot at the level of the fourth toe this was excised. No evidence of  infection 05/27/16; patient continues to complain weeping edema. He has new blisterlike wounds on the left anterior lateral and posterior lateral calf at the top of his wrap levels. The area on his left anterior foot appears better. He is not complaining of fever, pain or pruritus in his feet. 05/30/16; the patient's blisters on his left anterior leg posterior calf all look improved. He did not increase the Lasix 100 mg as I suggested because he was going to run out of his 40 mg tablets. He is still having weeping edema of his toes 06/03/16; I renewed his Lasix at 80 mg once a day as he was about to run out when I last saw him. He is on 80 mg of Lasix now. I have asked him to cut down on the excessive amount of water he was drinking and asked him to drink according to his thirst mechanisms 06/12/2016 -- was seen 2 days ago and was supposed to wear his compression stockings at home but he is developed lymphedema and superficial blisters on the left lower extremity and hence came in for a review 06/24/16; the remaining wound is on his left anterior leg. He still has edema coming from between his toes. There is lymphedema here however his edema is generally better than when I last saw this. He has a history of atrial fibrillation but does not have a known history of congestive heart failure nevertheless I think he probably has this at least on a diastolic basis. 07/01/16 I reviewed his echocardiogram from January 2017. This was essentially normal. He did not have LVH, EF of 55-60%. His right ventricular function was normal although he did have trivial tricuspid and pulmonic regurgitation. This is not audible on exam however. I increased his Lasix to do massive edema in his legs well above his knees I think in early July. He was also drinking an excessive amount of water at the time. 07/15/16; missed his appointment last week because of the Labor Day holiday on Monday. He could not get another appointment  later in the week. Started to feel the wrap digging in superiorly so we remove the top half and the bottom half of his wrap. He has extensive erythema and blistering superiorly in the left leg. Very tender. Very swollen. Edema in his foot with leaking edema fluid. He has not been systemically unwell 07/22/16; the area on the left leg laterally required some debridement. The medial wounds look more stable. His wrap injury wounds appear to have healed. Edema and his foot is better, weeping edema is also better. He tells me he is meeting with the supplier of the external compression pumps at work 08/05/16; the patient was on vacation last week in [facility]. His wrap is been on for an extended period of time. Also over the weekend he developed an extensive area of tender erythema  across his anterior medial thigh. He took to doxycycline yesterday that he had leftover from a previous prescription. The patient complains of weeping edema coming out of his toes 08/08/16; I saw this patient on 10/2. He was tender across his anterior thigh. I put him on doxycycline. He returns today in follow-up. He does not have any open wounds on his lower leg, he still has edema weeping into his toes. 08/12/16; patient was seen back urgently today to follow-up for his extensive left thigh cellulitis/erysipelas. He comes back with a lot less swelling and erythema pain is much better. I believe I gave him Augmentin and Cipro. His wrap was cut down as he stated a roll down his legs. He developed blistering above the level of the wrap that remained. He has 2 open blisters and 1 intact. 08/19/16; patient is been doing his primary doctor who is increased his Lasix from 40-80 once a day or 80 already has less edema. Cellulitis has remained improved in the left thigh. 2 open areas on the posterior left calf 08/26/16; he returns today having new open blisters on the anterior part of his left leg. He has his compression pumps but is  not yet been shown how to use some vital representative from the supplier. 09/02/16 patient returns today with no open wounds on the left leg. Some maceration in his plantar toes 09/10/2016 -- Dr. Leanord Hawking had recently discharged him on 09/02/2016 and he has come right back with redness swelling and some open ulcers on his left lower extremity. He says this was caused by trying to apply his compression stockings and he's been unable to use this and has not been able to use his lymphedema pumps. He had some doxycycline leftover and he has started on this a few days ago. 09/16/16; there are no open wounds on his leg on the left and no evidence of cellulitis. 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 He does not complain of symptoms here. I am not clear use using his external compression pumps. 09/23/16; I have not seen this patient in 2 [name]. He canceled his appointment 10 days ago as he was going on vacation. He tells me that on Monday he noticed a large area on his posterior left leg which is been draining copiously and is reopened into a large wound. He is been using ABDs and the external part of his juxtalite, according to our nurse this was not on properly. 10/07/16; Still a substantial area on the posterior left leg. Using silver alginate 10/14/16; in general better although there is still open area which looks healthy. Still using silver alginate. He reminds me that this happen before he left for [facility] Today while he was showering in the morning. He had been using his juxtalite's 10/21/16; the area on his posterior left leg is fully epithelialized. However he arrives today with a large area of tender erythema in his medial and posterior left thigh just above the knee. I have marked the area. Once again he is reluctant to consider hospitalization. I treated him with oral antibiotics in the past for a similar situation  with resolution I think with doxycycline however this area it seems more extensive to me. He is not complaining of fever but does have chills and says states he is thirsty. His blood sugar today was in the 140s at home 10/25/16 the area on his posterior left leg is fully epithelialized although there  is still some weeping edema. The large area of tenderness and erythema in his medial and posterior left thigh is a lot less tender although there is still a lot of swelling in this thigh. He states he feels a lot better. He is on doxycycline and Augmentin that I started last week. This will continued until Tuesday, December 26. I have ordered a duplex ultrasound of the left thigh rule out DVT whether there is an abscess something that would need to be drained I would also like to know. 11/01/16; he still has weeping edema from a not fully epithelialized area on his left posterior calf. Most of the rest of this looks a lot better. He has completed his antibiotics. His thigh is a lot better. Duplex ultrasound did not show a DVT in the thigh 11/08/16; he comes in today with more Denuded surface epithelium from the posterior aspect of his calf. There is no real evidence of cellulitis. The superior aspect of his wrap appears to have put quite an indentation in his leg just below the knee and this may have contributed. He does not complain of pain or fever. We have been using silver alginate as the primary dressing. The area of cellulitis in the right thigh has totally resolved. He has been using his compression stockings once a week 11/15/16; the patient arrives today with more loss of epithelium from the posterior aspect of his left calf. He now has a fairly substantial wound in this area. The reason behind this deterioration isn't exactly clear although his edema is not well controlled. He states he feels he is generally more swollen systemically. He is not complaining of chest pain shortness of breath  fever. Tells me he has an appointment with his primary physician in early February. He is on 80 mg of oral Lasix a day. He claims compliance with the external compression pumps. He is not having any pain in his legs similar to what he has with his recurrent cellulitis 11/22/16; the patient arrives a follow-up of his large area on his left lateral calf. This looks somewhat better today. He came in earlier in the week for a dressing change since I saw him a week ago. He is not complaining of any pain no shortness of breath no chest pain 11/28/16; the patient arrives for follow-up of his large area on the left lateral calf this does not look better. In fact it is larger weeping edema. The surface of the wound does not look too bad. We have been using silver alginate although I'm not certain that this is a dressing issue. 12/05/16; again the patient follows up for a large wound on the left lateral and left posterior calf this does not look better. There continues to be weeping edema necrotic surface tissue. More worrisome than this once again there is erythema below the wound involving the distal Achilles and heel suggestive of cellulitis. He is on his feet working most of the day of this is not going well. We are changing his dressing twice a week to facilitate the drainage. 12/12/16; not much change in the overall dimensions of the large area on the left posterior calf. This is very inflamed looking. I gave him an. Doxycycline last week does not really seem to have helped. He found the wrap very painful indeed it seems to of dog into his legs superiorly and perhaps around the heel. He came in early today because the drainage had soaked through his dressings. 12/19/16- patient arrives  for follow-up evaluation of his left lower extremity ulcers. He states that he is using his lymphedema pumps once daily when there is "no drainage". He admits to not using his lipedema pumps while under current treatment. His  blood sugars have been consistently between 150-200. 12/26/16; the patient is not using his compression pumps at home because of the wetness on his feet. I've advised him that I think it's important for him to use this daily. He finds his feet too wet, he can put a plastic bag over his legs while he is in the pumps. Otherwise I think will be in a vicious circle. We are using silver alginate to the major area on his left posterior calf 01/02/17; the patient's posterior left leg has further of all into 3 open wounds. All of them covered with a necrotic surface. He claims to be using his compression pumps once a day. His edema control is marginal. Continue with silver alginate 01/10/17; the patient's left posterior leg actually looks somewhat better. There is less edema, less erythema. Still has 3 open areas covered with a necrotic surface requiring debridement. He claims to be using his compression pumps once a day his edema control is better 01/17/17; the patient's left posterior calf look better last week when I saw him and his wrap was changed 2 days ago. He has noted increasing pain in the left heel and arrives today with much larger wounds extensive erythema extending down into the entire heel area especially tender medially. He is not systemically unwell CBGs have been controlled no fever. Our intake nurse showed me limegreen drainage on his AVD pads. 01/24/17; his usual this patient responds nicely to antibiotics last week giving him Levaquin for presumed Pseudomonas. The whole entire posterior part of his leg is much better much less inflamed and in the case of his Achilles heel area much less tender. He has also had some epithelialization posteriorly there are still open areas here and still draining but overall considerably better 01/31/17- He has continue to tolerate the compression wraps. he states that he continues to use the lymphedema pumps daily, and can increase to twice daily on the  weekends. He is voicing no complaints or concerns regarding his LLE ulcers 02/07/17-he is here for follow-up evaluation. He states that he noted some erythema to the left medial and anterior thigh, which he states is new as of yesterday. He is concerned about recurrent cellulitis. He states his blood sugars have been slightly elevated, this morning in the 180s 02/14/17; he is here for follow-up evaluation. When he was last here there was erythema superiorly from his posterior wound in his anterior thigh. He was prescribed Levaquin however a culture of the wound surface grew MRSA over the phone I changed him to doxycycline on Monday and things seem to be a lot better. 02/24/17; patient missed his appointment on Friday therefore we changed his nurse visit into a physician visit today. Still using silver alginate on the large area of the posterior left thigh. He isn't new area on the dorsal left second toe 03/03/17; actually better today although he admits he has not used his external compression pumps in the last 2 days or so because of work responsibilities over the weekend. 03/10/17; continued improvement. External compression pumps once a day almost all of his wounds have closed on the posterior left calf. Better edema control 03/17/17; in general improved. He still has 3 small open areas on the lateral aspect of his left leg  however most of the area on the posterior part of his leg is epithelialized. He has better edema control. He has an ABD pad under his stocking on the right anterior lower leg although he did not let us look at that today. 03/24/17; patient arrives back in clinic today with no open areas however there are areas on the posterior left calf and anterior left calf that are less than 100% epithelialized. His edema is well controlled in the left lower leg. There is some pitting edema probably lymphedema in the left upper thigh. He uses compression pumps at home once per day. I tried to get  him to do this twice a day although he is very reticent. 04/01/2017 -- for the last 2 days he's had significant redness, tenderness and weeping and came in for an urgent visit today. 04/07/17; patient still has 6 more days of doxycycline. He was seen by Dr. Meyer Russel last Wednesday for cellulitis involving the posterior aspect, lateral aspect of his Involving his heel. For the most part he is better there is less erythema and less weeping. He has been on his feet for 12 hours o2 over the weekend. Using his compression pumps once a day 04/14/17 arrives today with continued improvement. Only one area on the posterior left calf that is not fully epithelialized. He has intense bilateral venous inflammation associated with his chronic venous insufficiency disease and secondary lymphedema. We have been using silver alginate to the left posterior calf wound In passing he tells [place] today that the right leg but we have not seen in quite some time has an open area on it but he doesn't want [place] to look at this today states he will show this to [place] next week. 04/21/17; there is no open area on his left leg although he still reports some weeping edema. He showed [place] his right leg today which is the first time we've seen this leg in a long time. He has a large area of open wound on the right leg anteriorly healthy granulation. Quite a bit of swelling in the right leg and some degree of venous inflammation. He told us about the right leg in passing last week but states that deterioration in the right leg really only happened over the weekend 04/28/17; there is no open area on the left leg although there is an irritated part on the posterior which is like a wrap injury. The wound on the right leg which was new from last week at least to [place] is a lot better. 05/05/17; still no open area on the left leg. Patient is using his new compression stocking which seems to be doing a good job of controlling the edema. He states he is  using his compression pumps once per day. The right leg still has an open wound although it is better in terms of surface area. Required debridement. A lot of pain in the posterior right Achilles marked tenderness. Usually this type of presentation this patient gives concern for an active cellulitis 05/12/17; patient arrives today with his major wound from last week on the right lateral leg somewhat better. Still requiring debridement. He was using his compression stocking on the left leg however that is reopened with superficial wounds anteriorly he did not have an open wound on this leg previously. He is still using his juxta light's once daily at night. He cannot find the time to do this in the morning as he has to be at work by 7 AM  05/19/17; right lateral leg wound looks improved. No debridement required. The concerning area is on the left posterior leg which appears to almost have a subcutaneous hemorrhagic component to it. We've been using silver alginate to all the wounds 05/26/17; the right lateral leg wound continues to look improved. However the area on the left posterior calf is a tightly adherent surface. [place] using silver alginate. Because of the weeping edema in his legs there is very little good alternatives. 06/02/17; the patient left here last week looking quite good. Major wound on the left posterior calf and a small one on the right lateral calf. Both of these look satisfactory. He tells me that by Wednesday he had noted increased pain in the left leg and drainage. He called on Thursday and Friday to get an appointment here but we were blocked. He did not go to urgent care or his primary physician. He thinks he had a fever on Thursday but did not actually take his temperature. He has not been using his compression pumps on the left leg because of pain. I advised him to go to the emergency room today for IV antibiotics for stents of left leg cellulitis but he has refused I have  asked him to take 2 days off work to keep his leg elevated and he has refused this as well. In view of this I'm going to call him and Augmentin and doxycycline. 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 However after a detailed discussion regarding the risk benefits and alternatives he refuses to get admitted to the hospital. With no other recourse I will continue him on oral antibiotics as before and hopefully he'll have his infectious disease consultation this week. 06/16/2017 -- the patient was seen today by the nurse practitioner at infectious disease Ms. Dixon. 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 He has also been advise Lotrimin cream locally. she also had wise IV antibiotics for his condition if it flares up 06/23/17; patient arrives today with drainage bilaterally although the remaining wound on the left posterior calf after cleaning up today "highlighter yellow drainage" did not look too bad. Unfortunately he has had breakdown on the right anterior leg [previously this leg had not been open and he is using a black stocking] he went to see infectious disease and is been put on clindamycin 150 mg daily, I did not verify the dose although I'm not familiar with using clindamycin in this dosing range, perhaps for prophylaxisoo 06/27/17; I brought this patient back today to follow-up on the wound deterioration on the right lower leg together with surrounding cellulitis. I started him on doxycycline 4 days ago. This area looks better however he comes in today with intense  cellulitis on the medial part of his left thigh. This is not have a wound in this area. Extremely tender. We've been using silver alginate to the wounds on the right lower leg left lower leg with bilateral 4 layer compression he is using his external compression pumps once a day 07/04/17; patient's left medial thigh cellulitis looks better.  He has not been using his compression pumps as his insert said it was contraindicated with cellulitis. His right leg continues to make improvements all the wounds are still open. We only have one remaining wound on the left posterior calf. Using silver alginate to all open areas. He is on doxycycline which I started a week ago and should be finishing I gave him Augmentin after Thursday's visit for the severe cellulitis on the left medial thigh which fortunately looks better 07/14/17; the patient's left medial thigh cellulitis has resolved. The cellulitis in his right lower calf on the right also looks better. All of his wounds are stable to improved we've been using silver alginate he has completed the antibiotics I have given him. He has clindamycin 150 mg once a day prescribed by infectious disease for prophylaxis, I've advised him to start this now. We have been using bilateral Unna boots over silver alginate to the wound areas 07/21/17; the patient is been to see infectious disease who noted his recurrent problems with cellulitis. He was not able to tolerate prophylactic clindamycin therefore he is on amoxicillin 500 twice a day. He also had a second daily dose of Lasix added By Dr. Oneta Rack but he is not taking this. Nor is he being completely compliant with his compression pumps a especially not this week. He has 2 remaining wounds one on the right posterior lateral lower leg and one on the left posterior medial lower leg. 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 The patient has Unna  boots bilaterally. 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 He has bilateral Unna boots for compression and silver alginate to his wounds. Arrives today with his legs looking as good as I have seen him in quite some time. Not surprisingly his wounds look better as well with improvement on the right lateral leg venous insufficiency wound and also the left medial leg. He is still using the compression pumps once a day 08/11/17; both legs appear to be doing better wounds on the right lateral and left medial legs look better. Skin on the right leg quite good. He is been using silver alginate as the primary dressing. I'm going to use Anasept gel calcium alginate and maintain all the secondary dressings 08/18/17; the patient continues to actually do quite well. The area on his right lateral leg is just about closed the left medial also looks better although it is still moist in this area. His edema is well controlled we have been using Anasept gel with calcium alginate and the usual secondary dressings, 4 layer compression and once daily use of his compression pumps "always been able to manage 09/01/17; the patient continues to do reasonably well in spite of his trip to [place]. The area on the right lateral leg is epithelialized. Left is much better but still open. He has more edema and more chronic erythema on the left leg [venous inflammation] 09/08/17; he arrives today with no open wound on the right lateral leg and decently controlled edema. Unfortunately his left leg is not nearly as in his good situation as last week.he apparently had increasing edema starting on Saturday. He edema soaked through into his foot so used a plastic bag to walk around his home. The area on the medial right leg which  was his open area is about the same however he has  lost surface epithelium on the left lateral which is new and he has significant pain in the Achilles area of the left foot. He is already on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 areas on the right lateral mid calf which is just about healed. This was is more substantial area for quite a prolonged period of time. He has a small open area on the left anterior tibia. The area on the posterior calf just below the popliteal fossa is closed today. He is using his compression pumps twice a day 08/28/2018; patient has no open wound on the right leg. He has a smattering of open areas on the calf with some weeping lymphedema. More problematically than that it looks as though his wraps of slipped down in his usual he has very angry upper area of edema just below the right medial knee and on the right lateral calf. He has no open area on his feet. The patient is traveling to [facility] next week. I will send him in an antibiotic. We will continue to wrap the right leg. We ordered extremitease stockings for him last week and I plan to transition the right leg to a stocking when he gets home which will be in 10 days time. As usual he is very reluctant to take his  pumps with him when he travels 09/07/2018; patient returns from [facility]. He shows me a picture of his left leg in the mid part of his trip last week with intense fire engine erythema. The picture look bad enough I would have considered sending him to the hospital. Instead he went to the wound care center in [facility]. They did not prescribe him antibiotics but he did take some doxycycline he had leftover from a previous visit. I had given him trimethoprim sulfamethoxazole before he left this did not work according to the patient. This is resulted in some improvement fortunately. He comes back with a large wound on the left posterior calf. Smaller area on the left anterior tibia. Denuded blisters on the dorsal left foot over his toes. Does not have much in the way of wounds on the right leg although he does have a very tender area on the right posterior area just below the popliteal fossa also suggestive of infection. He promises me he is back on his pumps twice a day 09/15/2018; the intense cellulitis in his left lower calf is a lot better. The wound area on the posterior left calf is also so better. However he has reasonably extensive wounds on the dorsal aspect of his second and third toes and the proximal foot just at the base of the toes. There is nothing open on the right leg 09/22/2018; the patient has excellent edema control in his legs bilaterally. He is using his external compression pumps twice a day. He has no open area on the right leg and only the areas in the left foot dorsally second and third toe area on the left side. He does not have any signs of active cellulitis. 10/06/2018; the patient has good edema control bilaterally. He has no open wound on the  right leg. There is a blister in the posterior aspect of his left calf that we had to deal with today. He is using his compression pumps twice a day. There is no signs of active cellulitis. We have been using silver  alginate to the wound areas. He still has vulnerable areas on the base of his left first second toes dorsally He has a his extremities stockings and we are going to transition him today into the stocking on the right leg. He is cautioned that he will need to continue to use the compression pumps twice a day. If he notices uncontrolled edema in the right leg he may need to go to 3 times a day. 10/13/2018; the patient came in for a nurse check on Friday he has a large flaccid blister on the right medial calf just below the knee. We unroofed this. He has this and a new area underneath the posterior mid calf which was undoubtedly a blister as well. He also has several small areas on the right which is the area we put his extremities stocking on. 10/19/2018; the patient went to see infectious disease this morning I am not sure if that was a routine follow-up in any case the doxycycline I had given him was discontinued and started on linezolid. He has not started this. It is easy to look at his left calf and the inflammation and think this is cellulitis however he is very tender in the tissue just below the popliteal fossa and I have no doubt that there is infection going on here. He states the problem he is having is that with the compression pumps the edema goes down and then starts walking the wrap falls down. We will see if we can adhere this. He has 1 or 2 minuscule open areas on the right still areas that are weeping on the posterior left calf, the base of his left second and third toes 10/26/18; back today in clinic with quite of skin breakdown in his left anterior leg. This may have been infection the area below the popliteal fossa seems a lot better however tremendous epithelial loss on the left anterior mid tibia area over quite inexpensive tissue. He has 2 blisters on the right side but no other open wound here. 10/29/2018; came in urgently to see [place] today and we worked him in for review. He  states that the 4 layer compression on the right leg caused pain he had to cut it down to roughly his mid calf this caused swelling above the wrap and he has blisters and skin breakdown today. As a result of the pain he has not been using his pumps. Both legs are a lot more edematous and there is a lot of weeping fluid. 11/02/18; arrives in clinic with continued difficulties in the right leg> left. Leg is swollen and painful. multiple skin blisters and new open areas especially laterally. He has not been using his pumps on the right leg. 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 He is not systemically unwell. 11/09/2018; the patient claims he is being compliant with his pumps. He is finished the doxycycline I gave him last week. Culture I did of the wound on the right lateral leg showed a few very resistant methicillin staph aureus. This was resistant to doxycycline. Nevertheless he states the pain in the leg is a lot better which makes me wonder if the cultured organism was not really what was causing  the problem nevertheless this is a very dangerous organism to be culturing out of any wound. His right leg is still a lot larger than the left. He is using an Radio broadcast assistant on this area, he blames a 4-layer compression for causing the original skin breakdown which I doubt is true however I cannot talk him out of it. We have been using silver alginate to all of these areas which were initially blisters 11/16/2018; patient is being compliant with his external compression pumps at twice a day. Miraculously he arrives in clinic today with absolutely no open wounds. He has better edema control on the left where he has been using 4 layer compression versus wound of wounds on the right and I pointed this out to him. There is no inflammation in the skin in his lower legs which is also somewhat unusual for him. There is no open wounds on the dorsal left foot. He has  extremitease stockings at home and I have asked him to bring these in next week. 11/25/18 patient's lower extremity on examination today on the left appears for the most part to be wound free. He does have an open wound on the lateral aspect of the right lower extremity but this is minimal compared to what I've seen in past. He does request that we go ahead and wrap the left leg as well even though there's nothing open just so hopefully it will not reopen in short order. 1/28; patient has superficial open wounds on the right lateral calf left anterior calf and left posterior calf. His edema control is adequate. 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 We have been using silver alginate as the primary dressing. He claims compliance with his compression pumps 2/4; patient has superficial open wounds on numerous areas of his left calf and again one on the left dorsal foot. The areas on the right lateral calf have healed. The cellulitis that I gave him doxycycline for last week is also resolved this was mostly on the left anterior calf just below the tibial tuberosity. His edema looks fairly well-controlled. He tells me he went to see his primary doctor today and had blood work ordered 2/11; once again he has several open areas on the left calf left tibial area. Most of these are small and appear to have healthy granulation. He does not have anything open on the right. The edema and control in his thighs is pretty good which is usually a good indication he has been using his pumps as requested. 2/18; he continues to have several small areas on the left calf and left tibial area. Most of these are small healthy granulation. We put him in his stocking on the right leg last week and he arrives with a superficial open area over the right upper tibia and a fairly large area on the right lateral tibia in similar condition.  His edema control actually does not look too bad, he claims to be using his compression pumps twice a day 2/25. Continued small areas on the left calf and left tibial area. New areas especially on the right are identified just below the tibial tuberosity and on the right upper tibia itself. There are also areas of weeping edema fluid even without an obvious wound. He does not have a considerable degree of lymphedema but clearly there is more edema here than his skin can handle. He states he is  using the pumps twice a day. We have an Unna boot on the right and 4 layer compression on the left. 3/3; he continues to have an area on the right lateral calf and right posterior calf just below the popliteal fossa. There is a fair amount of tenderness around the wound on the popliteal fossa but I did not see any evidence of cellulitis, could just be that the wrap came down and rubbed in this area. ooHe does not have an open area on the left leg however there is an area on the left dorsal foot at the base of the third toe ooWe have been using silver alginate to all wound areas 3/10; he did not have an open area on his left leg last time he was here a week ago. Today he arrives with a horizontal wound just below the tibial tuberosity and an area on the left lateral calf. He has intense erythema and tenderness in this area. The area is on the right lateral calf and right posterior calf better than last week. We have been using silver alginate as usual 3/18 - Patient returns with 3 small open areas on left calf, and 1 small open area on right calf, the skin looks ok with no significant erythema, he continues the UNA boot on right and 4 layer compression on left. The right lateral calf wound is closed , the right posterior is small area. we will continue silver alginate to the areas. 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 He is been tolerating the dressing changes without complication. Fortunately she has made excellent improvement in regard to the overall status of his wounds. Unfortunately every time we cease wrapping him he ends up reopening in causing more significant issues at that point. Again I'm unsure of the best direction to take although I think the lymphedema clinic may be appropriate for him. 02/03/19 on evaluation today patient appears to be doing well in regard to the wounds that we saw him for last week unfortunately he has a new area on the proximal portion of his right medial/posterior lower extremity where the wrap somewhat slowed down and caused swelling and a blister to rub and open. Unfortunately this is the only opening that he has on either leg at this point. 02/17/19 on evaluation today patient's bilateral lower extremities appear to be doing well. He still completely healed in regard to the left lower extremity. In regard to the right lower extremity the area where the wrap and slid down and caused the blister still seems to be slightly open although this is dramatically better than during the last evaluation two [name] ago. I'm very pleased with the way this stands overall. 03/03/19 on evaluation today patient appears to be doing well in regard to his right lower extremity in general although he did have a new blister open this does not appear to be showing any evidence of active infection at this time. Fortunately there's No fevers, chills, nausea, or vomiting noted at this time. Overall I feel like he is making good progress it does feel like that the right leg will we perform the [employer] seems to do with a bit better than three layer wrap on the left which slid down on him. We may switch to doing bilateral in the book  wraps. 5/4; I have not seen Mr. Sanchez in quite some  time. According to our case manager he did not have an open wound on his left leg last week. He had 1 remaining wound on the right posterior medial calf. He arrives today with multiple openings on the left leg probably were blisters and/or wrap injuries from Unna boots. I do not think the Unna boot's will provide adequate compression on the left. I am also not clear about the frequency he is using the compression pumps. 03/17/19 on evaluation today patient appears to be doing excellent in regard to his lower extremities compared to last week's evaluation apparently. He had gotten significantly worse last week which is unfortunate. The [employer] wrap on the left did not seem to do very well for him at all and in fact it didn't control his swelling significantly enough he had an additional outbreak. Subsequently we go back to the four layer compression wrap on the left. This is good news. At least in that he is doing better and the wound seem to be killing him. He still has not heard anything from the lymphedema clinic. 03/24/19 on evaluation today patient actually appears to be doing much better in regard to his bilateral lower Trinity as compared to last week when I saw him. Fortunately there's no signs of active infection at this time. He has been tolerating the dressing changes without complication. Overall I'm extremely pleased with the progress and appearance in general. 04/07/19 on evaluation today patient appears to be doing well in regard to his bilateral lower extremities. His swelling is significantly down from where it was previous. With that being said he does have a couple blisters still open at this point but fortunately nothing that seems to be too severe and again the majority of the larger openings has healed at this time. 04/14/19 on evaluation today patient actually appears to be doing quite well in regard to his bilateral lower extremities in fact I'm not even sure there's anything  significantly open at this time at any site. Nonetheless he did have some trouble with these wraps where they are somewhat irritating him secondary to the fact that he has noted that the graph wasn't too close down to the end of this foot in a little bit short as well up to his knee. Otherwise things seem to be doing quite well. 04/21/19 upon evaluation today patient's wound bed actually showed evidence of being completely healed in regard to both lower extremities which is excellent news. There does not appear to be any signs of active infection which is also good news. I'm very pleased in this regard. No fevers, chills, nausea, or vomiting noted at this time. 04/28/19 on evaluation today patient appears to be doing a little bit worse in regard to both lower extremities on the left mainly due to the fact that when he went infection disease the wrap was not wrapped quite high enough he developed a blister above this. On the right he is a small open area of nothing too significant but again this is continuing to give him some trouble he has been were in the Velcro compression that he has at home. 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 He's been tolerating the dressing changes without complication. Fortunately there's no signs of active infection at this time. No fevers, chills, nausea, or vomiting noted at this time. We have been trying to get  an appointment with her lymphedema clinic in [facility] but unfortunately nobody can get them on phone with not been able to even fax information over the patient likewise is not been able to get in touch with them. Overall I'm not sure exactly what's going on here with to reach out again today. 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 Still having a lot of drainage unfortunately. He tells me  especially in the left but even on the right. There's no signs of active infection which is good news we've been using so ratcheted up to this point. 05/19/19 on evaluation today patient actually appears to be doing quite well with regard to his left lower extremity which is great news. Fortunately in regard to the right lower extremity has an issues with his wrap and he subsequently did remove this from what I'm understanding. Nonetheless long story short is what he had rewrapped once he removed it subsequently had maggots underneath this wrap whenever he came in for evaluation today. With that being said they were obviously completely cleaned away by the nursing staff. The visit today which is excellent news. However he does appear to potentially have some infection around the right ankle region where the maggots were located as well. He will likely require anabiotic therapy today. 05/26/19 on evaluation today patient actually appears to be doing much better in regard to his bilateral lower extremities. I feel like the infection is under much better control. With that being said there were maggots noted when the wrap was removed yet again today. Again this could have potentially been left over from previous although at this time there does not appear to be any signs of significant drainage there was obviously on the wrap some drainage as well this contracted gnats or otherwise. Either way I do not see anything that appears to be doing worse in my pinion and in fact I think his drainage has slowed down quite significantly likely mainly due to the fact to his infection being under better control. 06/02/2019 on evaluation today patient actually appears to be doing well with regard to his bilateral lower extremities there is no signs of active infection at this time which is great news. With that being said he does have several open areas more so on the right than the left but nonetheless these are all  significantly better than previously noted. 06/09/2019 on evaluation today patient actually appears to be doing well. His wrap stayed up and he did not cause any problems he had more drainage on the right compared to the left but overall I do not see any major issues at this time which is great news. 06/16/2019 on evaluation today patient appears to be doing excellent with regard to his lower extremities the only area that is open is a new blister that can have opened as of today on the medial ankle on the left. Other than this he really seems to be doing great I see no major issues at this point. 06/23/2019 on evaluation today patient appears to be doing quite well with regard to his bilateral lower extremities. In fact he actually appears to be almost completely healed there is a small area of weeping noted of the right lower extremity just above the ankle. Nonetheless fortunately there is no signs of active infection at this time which is good news. No fevers, chills, nausea, vomiting, or diarrhea. 8/24;  the patient arrived for a nurse visit today but complained of very significant pain in the left leg and therefore I was asked to look at this. Noted that he did not have an open area on the left leg last week nevertheless this was wrapped. The patient states that he is not been able to put his compression pumps on the left leg because of the discomfort. He has not been systemically unwell 06/30/2019 on evaluation today patient unfortunately despite being excellent last week is doing much worse with regard to his left lower extremity today. In fact he had to come in for a nurse on Monday where his left leg had to be rewrapped due to excessive weeping Dr. Leanord Hawking placed him on doxycycline at that point. Fortunately there is no signs of active infection Systemically at this time which is good news. 07/07/2019 in regard to the patient's wounds today he actually seems to be doing well with his right lower  extremity there really is nothing open or draining at this point this is great news. Unfortunately the left lower extremity is given him additional trouble at this time. There does not appear to be any signs of active infection nonetheless he does have a lot of edema and swelling noted at this point as well as blistering all of which has led to a much more poor appearing leg at this time compared to where it was 2 [name] ago when it was almost completely healed. Obviously this is a little discouraging for the patient. He is try to contact the lymphedema clinic in [place] he has not been able to get through to them. 07/14/2019 on evaluation today patient actually appears to be doing slightly better with regard to his left lower extremity ulcers. Overall I do feel like at least at the top of the wrap that we have been placing this area has healed quite nicely and looks much better. The remainder of the leg is showing signs of improvement. Unfortunately in the thigh area he still has an open region on the left and again on the right he has been utilizing just a Band-Aid on an area that also opened on the thigh. Again this is an area that were not able to wrap although we did do an Ace wrap to provide some compression that something that obviously is a little less effective than the compression wraps we have been using on the lower portion of the leg. He does have an appointment with the lymphedema clinic in [facility] on Friday. 07/21/2019 on evaluation today patient appears to be doing better with regard to his lower extremity ulcers. He has been tolerating the dressing changes without complication. Fortunately there is no signs of active infection at this time. No fevers, chills, nausea, vomiting, or diarrhea. I did receive the paperwork from the physical therapist at the lymphedema clinic in [place]. Subsequently I signed off on that this morning and sent that back to him for further  progression with the treatment plan. 07/28/2019 on evaluation today patient appears to be doing very well with regard to his right lower extremity where I do not see any open wounds at this point. Fortunately he is feeling great as far as that is concerned as well. In regard to the left lower extremity he has been having issues with still several areas of weeping and edema although the upper leg is doing better his lower leg still I think is going require the compression wrap at this time. No fevers,  chills, nausea, vomiting, or diarrhea. 08/04/2019 on evaluation today patient unfortunately is having new wounds on the right lower extremity. Again we have been using Unna boot wrap on that side. We switched him to using his juxta lite wrap at home. With that being said he tells me he has been using it although his legs extremely swollen and to be honest really does not appear that he has been. I cannot know that for sure however. Nonetheless he has multiple new wounds on the right lower extremity at this time. Obviously we will have to see about getting this rewrapped for him today. 08/11/2019 on evaluation today patient appears to be doing fairly well with regard to his wounds. He has been tolerating the dressing changes including the compression wraps without complication. He still has a lot of edema in his upper thigh regions bilaterally he is supposed to be seeing the lymphedema clinic on the 15th of this month once his wraps arrive for the upper part of his legs. 08/18/2019 on evaluation today patient appears to be doing well with regard to his bilateral lower extremities at this point. He has been tolerating the dressing changes without complication. Fortunately there is no signs of active infection which is also good news. He does have a couple weeping areas on the first and second toe of the right foot he also has just a small area on the left foot upper leg and a small area on the left lower  leg but overall he is doing quite well in my opinion. He is supposed to be getting his wraps shortly in fact tomorrow and then subsequently is seeing the lymphedema clinic next Wednesday on the 21st. Of note he is also leaving on the 25th to go on vacation for a week to the beach. For that reason and since there is some uncertainty about what there can be doing at lymphedema clinic next Wednesday I am get a make an appointment for next Friday here for [place] to see what we need to do for him prior to him leaving for vacation. 10/23; patient arrives in considerable pain predominantly in the upper posterior calf just distal to the popliteal fossa also in the wound anteriorly above the major wound. This is probably cellulitis and he has had this recurrently in the past. He has no open wound on the right side and he has had an Radio broadcast assistant in that area. Finally I note that he has an area on the left posterior calf which by enlarge is mostly epithelialized. This protrudes beyond the borders of the surrounding skin in the setting of dry scaly skin and lymphedema. The patient is leaving for [facility] on Sunday. Per his longstanding pattern, he will not take his compression pumps with him predominantly out of fear that they will be stolen. He therefore asked that we put a Unna boot back on the right leg. He will also contact the wound care center in [facility] to see if they can change his dressing in the mid week. 11/3; patient returned from his vacation to [facility] He was seen on 1 occasion at their wound care center. They did a 2 layer compression system as they did not have our 4-layer wrap. I am not completely certain what they put on the wounds. They did not change the Unna boot on the right. The patient is also seeing a lymphedema specialist physical therapist in [place]. It appears that he has some compression sleeve for his thighs  which indeed look quite a bit better than I am used to  seeing. He pumps over these with his external compression pumps. 11/10; the patient has a new wound on the right medial thigh otherwise there is no open areas on the right. He has an area on the left leg posteriorly anteriorly and medially and an area over the left second toe. We have been using silver alginate. He thinks the injury on his thigh is secondary to friction from the compression sleeve he has. 11/17; the patient has a new wound on the right medial thigh last week. He thinks this is because he did not have a underlying stocking for his thigh juxta lite apparatus. He now has this. The area is fairly large and somewhat angry but I do not think he has underlying cellulitis. ooHe has a intact blister on the right anterior tibial area. ooSmall wound on the right great toe dorsally ooSmall area on the medial left calf. 11/30; the patient does not have any open areas on his right leg and we did not take his juxta lite stocking off. However he states that on Friday his compression wrap fell down lodging around his upper mid calf area. As usual this creates a lot of problems for him. He called urgently today to be seen for a nurse visit however the nurse visit turned into a provider visit because of extreme erythema and pain in the left anterior tibia extending laterally and posteriorly. The area that is problematic is extensive Objective Constitutional Patient is hypertensive.. Pulse regular and within target range for patient.Marland Kitchen Respirations regular, non-labored and within target range.. Temperature is normal and within the target range for the patient.Marland Kitchen Appears in no distress. He does not appear to be systemically unwell. Vitals Time Taken: 2:08 AM, Height: 70 in, Weight: 380.2 lbs, BMI: 54.5, Temperature: 98.4 F, Pulse: 72 bpm, Respiratory Rate: 18 breaths/min, Blood Pressure: 165/62 mmHg, Capillary Blood Glucose: 172 mg/dl. General Notes: patient reported CBG of 172 this  morning Eyes Conjunctivae clear. No discharge.no icterus. Respiratory work of breathing is normal. Bilateral breath sounds are clear and equal in all lobes with no wheezes, rales or rhonchi.. Cardiovascular No evidence of CHF. No increased jugular venous pressure. Pedal pulses palpable. Gastrointestinal (GI) Abdomen is soft and non-distended without masses or tenderness.Marland Kitchen Psychiatric appears at normal baseline. General Notes: Wound exam ooWe did not look at his right leg. On the left he has an area across the upper mid tibia of removal of skin, erythema and marked tenderness. On the lateral part of his calf there appears to be subcutaneous bogginess which is probably lymphedema fluid but I could not be certain about this. This area was also tender and marked superiorly. The area of tenderness extends posteriorly and almost a linear area where the wrap would have been. Integumentary (Hair, Skin) Areas of erythema and tenderness on the left anterior tibia left lateral calf marked with a marker.. Wound #140 status is Healed - Epithelialized. Original cause of wound was Blister. The wound is located on the Left,Posterior Lower Leg. The wound measures 0cm length x 0cm width x 0cm depth; 0cm^2 area and 0cm^3 volume. There is no tunneling or undermining noted. There is a none present amount of drainage noted. The wound margin is flat and intact. There is no granulation within the wound bed. There is no necrotic tissue within the wound bed. Wound #142 status is Healed - Epithelialized. Original cause of wound was Blister. The wound is located on  the Left,Anterior Lower Leg. The wound measures 0cm length x 0cm width x 0cm depth; 0cm^2 area and 0cm^3 volume. There is no tunneling or undermining noted. There is a none present amount of drainage noted. The wound margin is flat and intact. There is no granulation within the wound bed. There is no necrotic tissue within the wound bed. Wound #145  status is Open. Original cause of wound was Gradually Appeared. The wound is located on the Right,Medial Upper Leg. The wound measures 0cm length x 0cm width x 0cm depth; 0cm^2 area and 0cm^3 volume. There is no tunneling or undermining noted. There is a none present amount of drainage noted. The wound margin is flat and intact. There is no granulation within the wound bed. There is no necrotic tissue within the wound bed. Wound #146 status is Open. Original cause of wound was Gradually Appeared. The wound is located on the Left Toe Second. The wound measures 0.4cm length x 0.9cm width x 0.1cm depth; 0.283cm^2 area and 0.028cm^3 volume. There is Fat Layer (Subcutaneous Tissue) Exposed exposed. There is no tunneling or undermining noted. There is a medium amount of serous drainage noted. The wound margin is flat and intact. There is large (67-100%) red granulation within the wound bed. There is no necrotic tissue within the wound bed. Wound #147 status is Open. Original cause of wound was Blister. The wound is located on the Left,Circumferential Lower Leg. The wound measures 7cm length x 46cm width x 0.1cm depth; 252.898cm^2 area and 25.29cm^3 volume. There is Fat Layer (Subcutaneous Tissue) Exposed exposed. There is no tunneling or undermining noted. There is a large amount of serous drainage noted. The wound margin is distinct with the outline attached to the wound base. There is large (67-100%) red, pink granulation within the wound bed. There is a small (1-33%) amount of necrotic tissue within the wound bed including Adherent Slough. 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 There was a Four Layer Compression Therapy Procedure by [name], RN. Post procedure Diagnosis Wound #147: Same as Pre-Procedure Plan Follow-up Appointments: Return Appointment in: - Keep appt for Wednesday 12/2 Dressing Change Frequency: Do not change entire dressing for one week. - both legs Skin Barriers/Peri-Wound Care: TCA Cream or Ointment - mix with lotion both legs Wound Cleansing: May shower with protection. Primary Wound Dressing: Wound #145 Right,Medial Upper Leg: Calcium Alginate with Silver Wound #146 Left Toe Second: Calcium Alginate with Silver Wound #147 Left,Circumferential Lower Leg: Calcium Alginate with Silver Secondary Dressing: Wound #145 Right,Medial Upper Leg: Foam Border Wound #146 Left Toe Second: Kerlix/Rolled Gauze - secure with tape Dry Gauze Wound #147 Left,Circumferential Lower Leg: ABD pad Kerramax - Zetuvit Edema Control: 4 layer compression: Left lower extremity - unna boot at upper portion of lower leg. Unna Boot to Right Lower Extremity Avoid standing for long periods of time Elevate legs to the level of the heart or above for 30 minutes daily and/or when sitting, a frequency of: Exercise regularly Segmental Compressive Device. - lymphadema pumps 60 min 2 times per day The following  medication(s) was prescribed: doxycycline monohydrate oral 100 mg capsule 1 capsule oral bid for 7 days for cellulites left leg starting 10/04/2019 1. After discussing having him go to the ER and predictably refused, I prescribed doxycycline 2. Nothing here really looks like a meaningful culture. 3. I suspect there is a blistered area underneath the skin on the left upper calf. I do not think this is an abscess but that certainly came to mind. 4.  I encouraged him to seek medical attention urgently if he becomes systemically unwell. 5. We put him back in 4 layer compression he is using his compression pumps. 6. He has a regular appointment in 2 days. I told the patient I think this area is worse than what I have seen in him previously. Electronic Signature(s) Signed: 10/04/2019 6:46:53 PM By: Baltazar Najjar MD Entered By: Baltazar Najjar on 10/04/2019 18:21:57 -------------------------------------------------------------------------------- SuperBill Details Patient Name: Date of Service: LAURI, TILL 10/04/2019 Medical Record [id number] Patient Account Number: [id number] Date of Birth/Sex: Treating RN: July 24, 1951 (68 y.o. [name] Primary Care Provider: Nicoletta Ba Other Clinician: Referring Provider: Treating Provider/Extender:Katryna Tschirhart, Lottie Rater, [name] in Treatment: 193 Diagnosis Coding ICD-10 Codes Code Description 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 skin L97.221 Non-pressure chronic ulcer of left calf limited to breakdown of skin 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 of bilateral lower extremity I89.0 Lymphedema, not elsewhere classified E11.622 Type 2 diabetes mellitus with other skin ulcer 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, unspecified L03.116 Cellulitis of left lower limb Facility Procedures CPT4 Code Description: [id number] (Facility Use Only) [phone] - APPLY MULTLAY COMPRS LWR LT LEG Modifier: Quantity: 1 Physician Procedures CPT4: Description Modifier Quantity Code [id number] - WC PHYS LEVEL 4 - EST PT 1 ICD-10 Diagnosis Description L97.221 Non-pressure chronic ulcer of left calf limited to breakdown of skin L03.116 Cellulitis of left lower limb I89.0 Lymphedema, not  elsewhere classified 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 of bilateral lower  extremity Electronic Signature(s) Signed: 10/04/2019 6:46:53 PM By: Baltazar Najjar MD Entered By: Baltazar Najjar on 10/04/2019 18:22:27

## 2019-10-05 ENCOUNTER — Other Ambulatory Visit: Payer: Self-pay

## 2019-10-05 ENCOUNTER — Other Ambulatory Visit: Payer: Self-pay | Admitting: Family Medicine

## 2019-10-05 NOTE — Progress Notes (Signed)
Kevin, [name] ([id number]) Visit Report for 10/04/2019 Arrival Information Details Patient Name: Date of Service: [name], [name] 10/04/2019 1:45 PM Medical Record [id number] Patient Account Number: [id number] Date of Birth/Sex: Treating RN: [birth year] (68 y.o. [name] Primary Care [name]: [name] Other Clinician: Referring [name]: Treating [name]/Extender:[name]: 43 Visit Information History Since Last Visit [name] Added or deleted any medications: No Patient Arrived: Any new allergies or adverse reactions: No Arrival Time: 14:07 Had a fall or experienced change in No Accompanied By: self activities of daily living that may affect Transfer Assistance: None risk of falls: Patient Identification Verified: Yes Signs or symptoms of abuse/neglect since last No Secondary Verification Process Completed: Yes visito Patient Requires Transmission-Based No Hospitalized since last visit: No Precautions: Implantable device outside of the clinic excluding No Patient Has Alerts: Yes cellular tissue based products placed in the center since last visit: Has Dressing in Place as Prescribed: Yes Has Compression in Place as Prescribed: Yes Pain Present Now: Yes Electronic Signature(s) Signed: 10/05/2019 7:10:15 AM By: [name] Entered By: [name] on 10/04/2019 14:08:54 -------------------------------------------------------------------------------- Compression Therapy Details Patient Name: Date of Service: [name], [name] 10/04/2019 1:45 PM Medical Record [id number] Patient Account Number: [id number] Date of Birth/Sex: Treating RN: 1951-04-02 (67 y.o. [name] Primary Care Kailash Hinze: [name] Other Clinician: Referring Charity Tessier: Treating [name]/Extender:[name]: 193 Compression Therapy Performed for Wound Wound #147  Left,Circumferential Lower Leg Assessment: Performed By: Clinician Kevin Hurst, RN Compression Type: Four Layer Post Procedure Diagnosis Same as Pre-procedure Electronic Signature(s) Signed: 10/04/2019 6:27:46 PM By: Kevin Hurst RN, BSN Entered By: [name] on 10/04/2019 15:17:51 -------------------------------------------------------------------------------- Encounter Discharge Information Details Patient Name: Date of Service: [name], [name] 10/04/2019 1:45 PM Medical Record [id number] Patient Account Number: [id number] Date of Birth/Sex: Treating RN: 1950-11-24 (68 y.o. [name] Primary Care [name]: [name] Other Clinician: Referring Lurie Mullane: Treating Jermiyah Ricotta/Extender:[name]: 193 Encounter Discharge Information Items Discharge Condition: Stable Ambulatory Status: [name] Discharge Destination: Home Transportation: Private Auto Accompanied By: self Schedule Follow-up Appointment: Yes Clinical Summary of Care: Electronic Signature(s) Signed: 10/04/2019 6:19:57 PM By: [name] Entered By: [name] on 10/04/2019 17:14:04 -------------------------------------------------------------------------------- Lower Extremity Assessment Details Patient Name: Date of Service: [name], [name] 10/04/2019 1:45 PM Medical Record [id number] Patient Account Number: [id number] Date of Birth/Sex: Treating RN: [birth year] (68 y.o. [name] Primary Care [name]: [name] Other Clinician: Referring [name]: Treating [name]/Extender:[name]: 193 Edema Assessment Assessed: [Left: No] [Right: No] Edema: [Left: Ye] [Right: s] Calf Left: Right: Point of Measurement: 31 cm From Medial Instep 52 cm cm Ankle Left: Right: Point of Measurement: 12 cm From Medial Instep 24.5 cm cm Vascular Assessment Pulses: Dorsalis Pedis Palpable:  [Left:Yes] Electronic Signature(s) Signed: 10/05/2019 7:10:15 AM By: [name] Entered By: [name] on 10/04/2019 14:12:57 -------------------------------------------------------------------------------- Multi Wound Chart Details Patient Name: Date of Service: [name] 10/04/2019 1:45 PM Medical Record [id number] Patient Account Number: [id number] Date of Birth/Sex: Treating RN: February 01, 1951 (68 y.o. [name] Primary Care [name]: [name] Other Clinician: Referring [name]: Treating [name]/Extender:[name]: 193 Vital Signs Height(in): 70 Capillary Blood 172 Glucose(mg/dl): Weight(lbs): 380.2 Pulse(bpm): 72 Body Mass Index(BMI): 55 Blood Pressure(mmHg): 165/62 Temperature(F): 98.4 Respiratory 18 Rate(breaths/min): Photos: [140:No Photos] [142:No Photos] [145:No Photos] Wound Location: [140:Left Lower Leg - Posterior] [142:Left Lower Leg - Anterior] [145:Right Upper Leg - Medial]  Wounding Event: [140:Blister] [142:Blister] [145:Gradually Appeared] Primary Etiology: [140:Lymphedema] [142:Lymphedema] [145:Lymphedema] Secondary Etiology: [140:N/A] [142:N/A] [145:N/A] Comorbid History: [140:Chronic sinus problems/congestion, Arrhythmia, Hypertension, Peripheral Arterial Disease, Type II Diabetes, History of Burn, Gout, Confinement Anxiety] [142:Chronic sinus problems/congestion, Arrhythmia, Hypertension, Peripheral  Arterial Disease, Type II Diabetes, History of Burn, Gout, Confinement Anxiety] [145:Chronic sinus problems/congestion, Arrhythmia, Hypertension, Peripheral Arterial Disease, Type II Diabetes, History of Burn, Gout, Confinement Anxiety] Date Acquired: [140:08/27/2019] [142:09/07/2019] [145:09/12/2019] Weeks of [name]: [140:5] [142:3] [145:2] Wound Status: [140:Healed - Epithelialized] [142:Healed - Epithelialized] [145:Open] Measurements L x W x D 0x0x0 [142:0x0x0]  [145:0x0x0] (cm) Area (cm) : [140:0] [142:0] [145:0] Volume (cm) : [140:0] [142:0] [145:0] % Reduction in Area: [140:100.00%] [142:100.00%] [145:100.00%] % Reduction in Volume: [140:100.00%] [142:100.00%] [145:100.00%] Classification: [140:Full Thickness Without Exposed Support Structures Exposed Support Structures] [142:Full Thickness Without] [145:Partial Thickness] Exudate Amount: [140:None Present] [142:None Present] [145:None Present] Exudate Type: [140:N/A] [142:N/A] [145:N/A] Exudate Color: [140:N/A] [142:N/A] [145:N/A] Wound Margin: [140:Flat and Intact] [142:Flat and Intact] [145:Flat and Intact] Granulation Amount: [140:None Present (0%)] [142:None Present (0%)] [145:None Present (0%)] Granulation Quality: [140:N/A] [142:N/A] [145:N/A] Necrotic Amount: [140:None Present (0%)] [142:None Present (0%)] [145:None Present (0%)] Exposed Structures: [140:Fascia: No Fat Layer (Subcutaneous Fat Layer (Subcutaneous Fat Layer (Subcutaneous Tissue) Exposed: No Tendon: No Muscle: No Joint: No Bone: No] [142:Fascia: No Tissue) Exposed: No Tendon: No Muscle: No Joint: No Bone: No] [145:Fascia: No Tissue)  Exposed: No Tendon: No Muscle: No Joint: No Bone: No] Epithelialization: [140:Large (67-100%)] [142:Large (67-100%)] [145:Large (67-100%)] Procedures Performed: [140:N/A] [142:N/A 146 270] [145:N/A N/A] Photos: [142:No Photos No Photos] [145:N/A] Wound Location: [140:Left Toe Second] [142:Left Lower Leg - Circumferential] [145:N/A] Wounding Event: [140:Gradually Appeared] [142:Blister] [145:N/A] Primary Etiology: [140:Lymphedema] [142:Lymphedema] [145:N/A] 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] [145:N/A] Comorbid History: [140:Chronic sinus problems/congestion, Arrhythmia, Hypertension, Arrhythmia, Hypertension, Peripheral Arterial Disease, Peripheral Arterial Disease, Type II Diabetes, History of Type II Diabetes, History of Burn, Gout, Confinement  Burn,  Gout, Confinement Anxiety] [142:Chronic sinus problems/congestion, Anxiety] [145:N/A] Date Acquired: [140:09/14/2019] [142:09/30/2019] [145:N/A] Weeks of [name]: [140:2] [142:0] [145:N/A] Wound Status: [140:Open] [142:Open] [145:N/A] Measurements L x W x D 0.4x0.9x0.1 [142:7x46x0.1] [145:N/A] (cm) Area (cm) : [140:0.283] [142:252.898] [145:N/A] Volume (cm) : [140:0.028] [142:25.29] [145:N/A] % Reduction in Area: [140:-28.60%] [142:0.00%] [145:N/A] % Reduction in Volume: -27.30% [142:0.00%] [145:N/A] Classification: [140:Full Thickness Without Exposed Support Structures Exposed Support Structures] [142:Full Thickness Without] [145:N/A] Exudate Amount: [140:Medium] [142:Large] [145:N/A] Exudate Type: [140:Serous] [142:Serous] [145:N/A] Exudate Color: [140:amber] [142:amber] [145:N/A] Wound Margin: [140:Flat and Intact] [142:Distinct, outline attached N/A] Granulation Amount: [140:Large (67-100%)] [142:Large (67-100%)] [145:N/A] Granulation Quality: [140:Red] [142:Red, Pink] [145:N/A] Necrotic Amount: [140:None Present (0%)] [142:Small (1-33%)] [145:N/A] Exposed Structures: [140:Fat Layer (Subcutaneous Fat Layer (Subcutaneous N/A Tissue) Exposed: Yes Fascia: No Tendon: No Muscle: No Joint: No Bone: No] [142:Tissue) Exposed: Yes Fascia: No Tendon: No Muscle: No Joint: No Bone: No] Epithelialization: [140:Small (1-33%) N/A] [142:None Compression Therapy] [145:N/A N/A] [name] Notes Wound #145 (Right, Medial Upper Leg) 1. Cleanse With Wound Cleanser 2. Periwound Care Skin Prep 3. Primary Dressing Applied Calcium Alginate Ag 4. Secondary Dressing Foam Border Dressing 5. Secured With Self Adhesive Bandage Wound #146 (Left Toe Second) 1. Cleanse With Wound Cleanser Soap and water 3. Primary Dressing Applied Calcium Alginate Ag 4. Secondary Dressing Dry Gauze Roll Gauze 5. Secured With Medipore tape Wound #147 (Left, Circumferential Lower Leg) 1. Cleanse With Wound  Cleanser Soap and water 2. Periwound Care Moisturizing lotion TCA Cream 3. Primary Dressing Applied Calcium Alginate Ag 4. Secondary Dressing ABD Pad 6.  Support Layer Applied 4 layer compression wrap Notes unna boot upper portion of lower leg. stockinette. Electronic Signature(s) Signed: 10/04/2019 6:27:46 PM By: Kevin Hurst RN, BSN Signed: 10/04/2019 6:46:53 PM By: Linton Ham MD Entered By: Linton Ham on 10/04/2019 18:14:46 -------------------------------------------------------------------------------- [place] Details Patient Name: Date of Service: [name], [name] 10/04/2019 1:45 PM Medical Record [id number] Patient Account Number: [id number] Date of Birth/Sex: Treating RN: 1951/09/25 (68 y.o. [name] Primary Care Callum Wolf: [name] Other Clinician: Referring Alden Bensinger: Treating Pritesh Sobecki/Extender:[name]: [phone] Active Inactive Venous Leg Ulcer Nursing Diagnoses: Actual venous Insuffiency (use after diagnosis is confirmed) Goals: Patient will maintain optimal edema control Date Initiated: 09/10/2016 Target Resolution Date: 11/05/2019 Goal Status: Active Verify adequate tissue perfusion prior to therapeutic compression application Date Initiated: 09/10/2016 Date Inactivated: 11/28/2016 Goal Status: Met Interventions: Assess peripheral edema status every visit. Compression as ordered Provide education on venous insufficiency Notes: edema not contolled above wraps, pt not using lymoh pumps regularly Wound/Skin Impairment Nursing Diagnoses: Impaired tissue integrity Goals: Patient/caregiver will verbalize understanding of skin care regimen Date Initiated: 09/10/2016 Target Resolution Date: 11/05/2019 Goal Status: Active Interventions: Assess patient/caregiver ability to perform ulcer/skin care regimen upon admission and as needed Assess ulceration(s) every visit Provide  education on ulcer and skin care Notes: Electronic Signature(s) Signed: 10/04/2019 6:27:46 PM By: Kevin Hurst RN, BSN Entered By: [name] on 10/04/2019 15:14:16 -------------------------------------------------------------------------------- Pain Assessment Details Patient Name: Date of Service: [name] 10/04/2019 1:45 PM Medical Record [id number] Patient Account Number: [id number] Date of Birth/Sex: Treating RN: 08-Feb-1951 (68 y.o. [name] Primary Care Elaf Clauson: [name] Other Clinician: Referring Donnell Beauchamp: Treating Frederika Hukill/Extender:[name]: 193 Active Problems Location of Pain Severity and Description of Pain Patient Has Paino Yes Site Locations Pain Location: Pain in Ulcers With Dressing Change: Yes Duration of the Pain. Constant / Intermittento Constant Rate the pain. Current Pain Level: 5 Worst Pain Level: 8 Least Pain Level: 5 Tolerable Pain Level: 5 Character of Pain Describe the Pain: Burning, Sharp Pain Management and Medication Current Pain Management: Medication: Yes Rest: Yes How does your wound impact your activities of daily livingo Sleep: No Appetite: No Electronic Signature(s) Signed: 10/05/2019 7:10:15 AM By: [name] Entered By: [name] on 10/04/2019 14:10:46 -------------------------------------------------------------------------------- Patient/Caregiver Education Details Patient Name: Date of Service: [name] 11/30/2020andnbsp1:45 PM Medical Record [id number] Patient Account Number: [id number] Date of Birth/Gender: [birth year] (68 y.o. M) Treating RN: [name] Primary Care Physician: [name] Other Clinician: Referring Physician: Treating Physician/Extender:[name]: 83 Education Assessment Education Provided To: Patient Education Topics Provided Infection: Methods:  Explain/Verbal Responses: State content correctly Venous: Methods: Explain/Verbal Responses: State content correctly Wound/Skin Impairment: Methods: Explain/Verbal Responses: State content correctly Electronic Signature(s) Signed: 10/04/2019 6:27:46 PM By: Kevin Hurst RN, BSN Entered By: [name] on 10/04/2019 15:17:16 -------------------------------------------------------------------------------- Wound Assessment Details Patient Name: Date of Service: [name] 10/04/2019 1:45 PM Medical Record [id number] Patient Account Number: [id number] Date of Birth/Sex: Treating RN: 07-20-1951 (68 y.o. [name] Primary Care Dajia Gunnels: [name] Other Clinician: Referring Simone Tuckey: Treating Devonia Farro/Extender:[name]: 193 Wound Status Wound Number: [id number] Primary Lymphedema Etiology: Wound Location: Left Lower Leg - Posterior Wound Healed - Epithelialized Wounding Event: Blister Status: Date Acquired: 08/27/2019 Comorbid Chronic sinus problems/congestion, Weeks Of [name]: 5 History: Arrhythmia, Hypertension, Peripheral Arterial Clustered Wound: No Disease, Type II Diabetes, History of Burn, Gout, Confinement Anxiety Wound Measurements Length: (  cm) 0 % Reducti Width: (cm) 0 % Reducti Depth: (cm) 0 Epithelia Area: (cm) 0 Tunnelin Volume: (cm) 0 Underm Wound Description Classification: Full Thickness Without Exposed Support Structures Wound Flat and Intact Margin: Exudate None Present Amount: Wound Bed Granulation Amount: None Present (0%) Necrotic Amount: None Present (0%) Foul Odor After Cleansing: No Slough/Fibrino No Exposed Structure Fascia Exposed: No Fat Layer (Subcutaneous Tissue) Exposed: No Tendon Exposed: No Muscle Exposed: No Joint Exposed: No Bone Exposed: No on in Area: 100% on in Volume: 100% lization: Large (67-100%) g: No ining: No Electronic Signature(s) Signed:  10/05/2019 7:10:15 AM By: [name] Entered By: [name] on 10/04/2019 14:16:50 -------------------------------------------------------------------------------- Wound Assessment Details Patient Name: Date of Service: [name], [name] 10/04/2019 1:45 PM Medical Record [id number] Patient Account Number: [id number] Date of Birth/Sex: Treating RN: 11-06-1950 (68 y.o. [name] Primary Care Lennan Malone: [name] Other Clinician: Referring Nicky Kras: Treating Jean Alejos/Extender:[name]: 193 Wound Status Wound Number: [id number] Primary Lymphedema Etiology: Wound Location: Left Lower Leg - Anterior Wound Healed - Epithelialized Wounding Event: Blister Status: Date Acquired: 09/07/2019 Comorbid Chronic sinus problems/congestion, Weeks Of [name]: 3 History: Arrhythmia, Hypertension, Peripheral Arterial Clustered Wound: No Disease, Type II Diabetes, History of Burn, Gout, Confinement Anxiety Wound Measurements Length: (cm) 0 % Reduc Width: (cm) 0 % Reduc Depth: (cm) 0 Epithel Area: (cm) 0 Tunnel Volume: (cm) 0 Underm Wound Description Classification: Full Thickness Without Exposed Support Foul Od Structures Slough/ Wound Wound Flat and Intact Margin: Exudate None Present Amount: Wound Bed Granulation Amount: None Present (0%) Necrotic Amount: None Present (0%) Fascia Fat La Tendon Muscle Joint Bone E or After Cleansing: No Fibrino No Exposed Structure Exposed: No yer (Subcutaneous Tissue) Exposed: No Exposed: No Exposed: No Exposed: No xposed: No tion in Area: 100% tion in Volume: 100% ialization: Large (67-100%) ing: No ining: No Electronic Signature(s) Signed: 10/05/2019 7:10:15 AM By: [name] Entered By: [name] on 10/04/2019 14:18:31 -------------------------------------------------------------------------------- Wound Assessment Details Patient Name: Date of  Service: [name], [name] 10/04/2019 1:45 PM Medical Record [id number] Patient Account Number: [id number] Date of Birth/Sex: Treating RN: 09-01-1951 (68 y.o. [name] Primary Care Reverie Vaquera: [name] Other Clinician: Referring Kathern Lobosco: Treating Renzo Vincelette/Extender:[name]: 193 Wound Status Wound Number: [id number] Primary Lymphedema Etiology: Wound Location: Right Upper Leg - Medial Wound Open Wounding Event: Gradually Appeared Status: Date Acquired: 09/12/2019 Comorbid Chronic sinus problems/congestion, Weeks Of [name]: 2 History: Arrhythmia, Hypertension, Peripheral Arterial Clustered Wound: No Disease, Type II Diabetes, History of Burn, Gout, Confinement Anxiety Wound Measurements Length: (cm) 0 % Reduc Width: (cm) 0 % Reduc Depth: (cm) 0 Epithel Area: (cm) 0 Tunnel Volume: (cm) 0 Underm Wound Description Classification: Partial Thickness Foul O Wound Margin: Flat and Intact Slough Exudate Amount: None Present Wound Bed Granulation Amount: None Present (0%) Necrotic Amount: None Present (0%) Fascia Fat Layer ( Tendon Expo Muscle Expo Joint Expos Bone Expose dor After Cleansing: No /Fibrino No Exposed Structure Exposed: No Subcutaneous Tissue) Exposed: No sed: No sed: No ed: No d: No tion in Area: 100% tion in Volume: 100% ialization: Large (67-100%) ing: No ining: No [name] Notes Wound #145 (Right, Medial Upper Leg) 1. Cleanse With Wound Cleanser 2. Periwound Care Skin Prep 3. Primary Dressing Applied Calcium Alginate Ag 4. Secondary Dressing Foam Border Dressing 5. Secured With Office manager) Signed: 10/05/2019 7:10:15 AM By: [name] Entered By: [name] on 10/04/2019 14:23:23 -------------------------------------------------------------------------------- Wound Assessment Details Patient Name:  Date of Service: [name], [name]  10/04/2019 1:45 PM Medical Record [id number] Patient Account Number: [id number] Date of Birth/Sex: Treating RN: 05-07-1951 (68 y.o. [name] Primary Care Previn Jian: [name] Other Clinician: Referring Katlynne Mckercher: Treating Geovani Tootle/Extender:[name]: 193 Wound Status Wound Number: [id number] Primary Lymphedema Etiology: Wound Location: Left Toe Second Secondary Diabetic Wound/Ulcer of the Lower Extremity Wounding Event: Gradually Appeared Etiology: Date Acquired: 09/14/2019 Wound Open Weeks Of [name]: 2 Status: Clustered Wound: No Comorbid Chronic sinus problems/congestion, History: Arrhythmia, Hypertension, Peripheral Arterial Disease, Type II Diabetes, History of Burn, Gout, Confinement Anxiety Wound Measurements Length: (cm) 0.4 % Reduction Width: (cm) 0.9 % Reduction Depth: (cm) 0.1 Epitheliali Area: (cm) 0.283 Tunneling: Volume: (cm) 0.028 Underminin Wound Description Classification: Full Thickness Without Exposed Support Foul O Structures Slough Wound Flat and Intact Margin: Exudate Medium Amount: Exudate Serous Type: Exudate amber Color: Wound Bed Granulation Amount: Large (67-100%) Granulation Quality: Red Fascia Necrotic Amount: None Present (0%) Fat La Tendon Muscle Joint Bone E dor After Cleansing: No /Fibrino No Exposed Structure Exposed: No yer (Subcutaneous Tissue) Exposed: Yes Exposed: No Exposed: No Exposed: No xposed: No in Area: -28.6% in Volume: -27.3% zation: Small (1-33%) No g: No [name] Notes Wound #146 (Left Toe Second) 1. Cleanse With Wound Cleanser Soap and water 3. Primary Dressing Applied Calcium Alginate Ag 4. Secondary Dressing Dry Gauze Roll Gauze 5. Secured With [employer]) Signed: 10/05/2019 7:10:15 AM By: [name] Entered By: [name] on 10/04/2019  14:20:49 -------------------------------------------------------------------------------- Wound Assessment Details Patient Name: Date of Service: [name], [name] 10/04/2019 1:45 PM Medical Record [id number] Patient Account Number: [id number] Date of Birth/Sex: Treating RN: 1951/01/05 (68 y.o. [name] Primary Care Tharon Bomar: [name] Other Clinician: Referring Delayza Lungren: Treating Tyran Huser/Extender:[name]: 193 Wound Status Wound Number: [id number] Primary Lymphedema Etiology: Wound Location: Left Lower Leg - Circumferential Wound Open Wounding Event: Blister Status: Date Acquired: 09/30/2019 ComorbidChronic sinus problems/congestion, Weeks Of [name]: 0 Weeks Of [name]: 0 History: Arrhythmia, Hypertension, Peripheral Arterial Clustered Wound: No Disease, Type II Diabetes, History of Burn, Gout, Confinement Anxiety Wound Measurements Length: (cm) 7 % Reduct Width: (cm) 46 % Reduct Depth: (cm) 0.1 Epitheli Area: (cm) 252.898 Tunneli Volume: (cm) 25.29 Undermi Wound Description Classification: Full Thickness Without Exposed Support Foul Odo Structures Slough/F Wound Distinct, outline attached Margin: Exudate Large Amount: Exudate Serous Type: Exudate amber Color: Wound Bed Granulation Amount: Large (67-100%) Granulation Quality: Red, Pink Fascia E Necrotic Amount: Small (1-33%) Fat Laye Necrotic Quality: Adherent Slough Tendon E Muscle E Joint Ex Bone Exp r After Cleansing: No ibrino Yes Exposed Structure xposed: No r (Subcutaneous Tissue) Exposed: Yes xposed: No xposed: No posed: No osed: No ion in Area: 0% ion in Volume: 0% alization: None ng: No ning: No [name] Notes Wound #147 (Left, Circumferential Lower Leg) 1. Cleanse With Wound Cleanser Soap and water 2. Periwound Care Moisturizing lotion TCA Cream 3. Primary Dressing Applied Calcium Alginate Ag 4. Secondary  Dressing ABD Pad 6. Support Layer Applied 4 layer compression wrap Notes unna boot upper portion of lower leg. stockinette. Electronic Signature(s) Signed: 10/05/2019 7:10:15 AM By: [name] Entered By: [name] on 10/04/2019 14:27:01 -------------------------------------------------------------------------------- Vitals Details Patient Name: Date of Service: [name], [name] 10/04/2019 1:45 PM Medical Record [id number] Patient Account Number: [id number] Date of Birth/Sex: Treating RN: [birth year] (68 y.o. [name] Primary Care Auriana Scalia: [name] Other Clinician: Referring Eyvonne Burchfield: Treating Kameka Whan/Extender:[name]:  193 Vital Signs Time Taken: 02:08 Temperature (F): 98.4 Height (in): 70 Pulse (bpm): 72 Weight (lbs): 380.2 Respiratory Rate (breaths/min): 18 Body Mass Index (BMI): 54.5 Blood Pressure (mmHg): 165/62 Capillary Blood Glucose (mg/dl): 172 Reference Range: 80 - 120 mg / dl Notes patient reported CBG of 172 this morning Electronic Signature(s) Signed: 10/05/2019 7:10:15 AM By: [name] Entered By: [name] on 10/04/2019 14:09:58

## 2019-10-06 ENCOUNTER — Ambulatory Visit (INDEPENDENT_AMBULATORY_CARE_PROVIDER_SITE_OTHER): Payer: Medicare Other | Admitting: Family Medicine

## 2019-10-06 ENCOUNTER — Encounter (HOSPITAL_BASED_OUTPATIENT_CLINIC_OR_DEPARTMENT_OTHER): Payer: Medicare Other | Attending: Physician Assistant | Admitting: Physician Assistant

## 2019-10-06 ENCOUNTER — Encounter: Payer: Self-pay | Admitting: Family Medicine

## 2019-10-06 VITALS — BP 171/81 | HR 71 | Temp 98.2°F | Resp 16 | Ht 70.0 in | Wt 382.8 lb

## 2019-10-06 DIAGNOSIS — E118 Type 2 diabetes mellitus with unspecified complications: Secondary | ICD-10-CM | POA: Diagnosis not present

## 2019-10-06 DIAGNOSIS — L97222 Non-pressure chronic ulcer of left calf with fat layer exposed: Secondary | ICD-10-CM | POA: Insufficient documentation

## 2019-10-06 DIAGNOSIS — E1151 Type 2 diabetes mellitus with diabetic peripheral angiopathy without gangrene: Secondary | ICD-10-CM | POA: Insufficient documentation

## 2019-10-06 DIAGNOSIS — Z6841 Body Mass Index (BMI) 40.0 and over, adult: Secondary | ICD-10-CM | POA: Insufficient documentation

## 2019-10-06 DIAGNOSIS — I482 Chronic atrial fibrillation, unspecified: Secondary | ICD-10-CM | POA: Insufficient documentation

## 2019-10-06 DIAGNOSIS — I10 Essential (primary) hypertension: Secondary | ICD-10-CM | POA: Diagnosis not present

## 2019-10-06 DIAGNOSIS — L03116 Cellulitis of left lower limb: Secondary | ICD-10-CM | POA: Diagnosis not present

## 2019-10-06 DIAGNOSIS — I872 Venous insufficiency (chronic) (peripheral): Secondary | ICD-10-CM | POA: Diagnosis not present

## 2019-10-06 DIAGNOSIS — Z9119 Patient's noncompliance with other medical treatment and regimen: Secondary | ICD-10-CM | POA: Insufficient documentation

## 2019-10-06 DIAGNOSIS — I890 Lymphedema, not elsewhere classified: Secondary | ICD-10-CM | POA: Diagnosis not present

## 2019-10-06 DIAGNOSIS — L97512 Non-pressure chronic ulcer of other part of right foot with fat layer exposed: Secondary | ICD-10-CM | POA: Insufficient documentation

## 2019-10-06 DIAGNOSIS — L03119 Cellulitis of unspecified part of limb: Secondary | ICD-10-CM | POA: Diagnosis not present

## 2019-10-06 DIAGNOSIS — L97819 Non-pressure chronic ulcer of other part of right lower leg with unspecified severity: Secondary | ICD-10-CM | POA: Diagnosis not present

## 2019-10-06 DIAGNOSIS — E669 Obesity, unspecified: Secondary | ICD-10-CM | POA: Insufficient documentation

## 2019-10-06 DIAGNOSIS — N183 Chronic kidney disease, stage 3 unspecified: Secondary | ICD-10-CM | POA: Diagnosis not present

## 2019-10-06 DIAGNOSIS — E11621 Type 2 diabetes mellitus with foot ulcer: Secondary | ICD-10-CM | POA: Insufficient documentation

## 2019-10-06 DIAGNOSIS — I87333 Chronic venous hypertension (idiopathic) with ulcer and inflammation of bilateral lower extremity: Secondary | ICD-10-CM | POA: Diagnosis not present

## 2019-10-06 DIAGNOSIS — E11622 Type 2 diabetes mellitus with other skin ulcer: Secondary | ICD-10-CM | POA: Insufficient documentation

## 2019-10-06 DIAGNOSIS — L97522 Non-pressure chronic ulcer of other part of left foot with fat layer exposed: Secondary | ICD-10-CM | POA: Diagnosis not present

## 2019-10-06 DIAGNOSIS — Z794 Long term (current) use of insulin: Secondary | ICD-10-CM | POA: Diagnosis not present

## 2019-10-06 DIAGNOSIS — G894 Chronic pain syndrome: Secondary | ICD-10-CM

## 2019-10-06 DIAGNOSIS — I272 Pulmonary hypertension, unspecified: Secondary | ICD-10-CM | POA: Diagnosis not present

## 2019-10-06 DIAGNOSIS — L97212 Non-pressure chronic ulcer of right calf with fat layer exposed: Secondary | ICD-10-CM | POA: Diagnosis not present

## 2019-10-06 DIAGNOSIS — N2889 Other specified disorders of kidney and ureter: Secondary | ICD-10-CM

## 2019-10-06 LAB — BASIC METABOLIC PANEL
BUN: 17 mg/dL (ref 6–23)
CO2: 27 mEq/L (ref 19–32)
Calcium: 8.5 mg/dL (ref 8.4–10.5)
Chloride: 98 mEq/L (ref 96–112)
Creatinine, Ser: 1.37 mg/dL (ref 0.40–1.50)
GFR: 51.6 mL/min — ABNORMAL LOW (ref >60.00)
Glucose, Bld: 130 mg/dL — ABNORMAL HIGH (ref 70–99)
Potassium: 4.5 mEq/L (ref 3.5–5.1)
Sodium: 136 mEq/L (ref 135–145)

## 2019-10-06 LAB — HEMOGLOBIN A1C: Hgb A1c MFr Bld: 7.2 % — ABNORMAL HIGH (ref 4.6–6.5)

## 2019-10-06 MED ORDER — HYDROCODONE-ACETAMINOPHEN 5-325 MG PO TABS: ORAL_TABLET | ORAL | 0 refills | Status: DC

## 2019-10-06 NOTE — Progress Notes (Signed)
OFFICE VISIT  10/06/2019   CC:  Chief Complaint  Patient presents with  . Follow-up    [name], pt is fasting   HPI:    Patient is a 68 y.o. Caucasian male who presents for 3 mo f/u DM 2, HTN, CRI with GFR in the 60s.  He has chronic bilat LL cellulitis as a complication of his severe lymphedema and recurrent stasis ulcers.  He gets routine management at the wound care clinic and infectious dz. He gets pen G injection one a month here and is due today for this. He also has chronic a-fib and takes xarelto.  Has chronic combined syst/diast HF: has gained 7 lbs since last f/u.  Says this is likely due to LESS activity lately + increased caloric and Na intake over the recent holiday. He has had a recurrence of cellulitis in L LE, started on doxycycline.  He gets unna boot replaced today by wound clinic.  DM: Novolog mix 75/25, 25 qAM and 20 qPM--he uses some latitude with, metformin 1000 mg bid. Checking fasting gluc and presupper: avg fasting 170, avg presupper 210 or so. No symptomatic hypoglyc.  HTN: Has not taken bp meds yet today. Home bp consistently <130/80.  CRI: avoids NSAIDs.  Typically drinks 16 oz water per day, some soda, some milk.  Vicodin for chronic LBP with sciatica, uses this most nights to quell the pain in order for him to get sleep. No side effects.   PMP AWARE reviewed today: most recent vicodin rx filled 08/18/19, #60, rx by me.  No red flags. He requests RF today. [facility] UTD 04/05/19.  Past Medical History:  Diagnosis Date  . ALLERGIC RHINITIS 08/11/2006  . ASTHMA 08/11/2006  . Chronic atrial fibrillation (HCC) 08/2014  . Chronic combined systolic and diastolic CHF (congestive heart failure) [facility])    Cardiology f/u 12/2017: pt volume overloaded (R heart dysf suspected), BNP very high, lasix increased.  Repeat echo 12/2017: normal LV EF, mild DD, +RV syst dysfxn, mod pulm HTN, biatrial enlgmt.  . Chronic constipation   . Chronic renal insufficiency, stage 2 (mild)     Borderline stage III (GFR 60s).  . DIABETES MELLITUS, TYPE II 08/11/2006  . DM W/RENAL MANIFESTATIONS, TYPE II 04/20/2007  . HYPERTENSION 08/11/2006  . MRSA infection 06/2018   LL venous stasis ulcer infected  . Normocytic anemia 2016-2019   03/2018 B12 normal, iron ok (ferritin borderline low).  Plan repeat CBC at f/u summer 2019 and if decreased from baseline will check hemoccults and iron labs again.  . OBESITY, MORBID 12/14/2007  . OSA (obstructive sleep apnea)    to get sleep study with [facility]) as of 12/03/2018 consult.  . 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  [id number] ID started him on amoxil prophylaxis---ineffective.  End 2018/Jan 2019 penicillin G injections prophyl helpful but pt declined to continue this as of 01/2018 ID f/u.  ID talked him into resuming monthly penicillin G as of 02/2018 f/u.    Marland Kitchen Restless leg syndrome    Rx'd clonazepam 09/2017 and pt refused to take it after reading the medication's potential side effects.  . Venous stasis ulcers of both lower extremities (HCC)    Severe lymphedema.  wound clinic care ongoing as of 01/2018    Past Surgical History:  Procedure Laterality Date  . Carotid dopplers  07/23/2018   Left NORMAL.  Right 1-39% ICA stenosis, with <50% distal CCA stenosis (not  hemodynamically significant)  . TRANSTHORACIC ECHOCARDIOGRAM  11/2007; 09/2014; 11/2015;12/2017   LV fxn normal, EF normal, mild dilation of left atrium.  2015 grade II diast dysfxn.  2017 EF 55-60%. 2019: LVEF 60-65%, mild RV syst dysf,biatrial enlgmt, mod pulm htn.  Marland Kitchen. URETERAL STENT PLACEMENT    . virtual colonoscopy  01/2011   Normal    Outpatient Medications Prior to Visit  Medication Sig Dispense Refill  . arginine 500 MG tablet Take by mouth.    Marland Kitchen. b complex vitamins capsule Take 1 capsule by mouth every morning.    . butalbital-acetaminophen-caffeine (FIORICET WITH CODEINE) 50-325-40-30 MG  capsule TAKE 1 TO 2 CAPSULES BY  MOUTH EVERY 6 HOURS AS  NEEDED FOR HEADACHE(S)  MANUFACTURER RECOMMENDS NOT EXCEEDING 6 CAPSULES/DAY 30 capsule 0  . cetirizine (ZYRTEC) 10 MG tablet Take 10 mg by mouth daily.    . Coenzyme Q10 (CO Q 10) 10 MG CAPS Take by mouth. Reported on 02/19/2016    . diltiazem (TIAZAC) 240 MG 24 hr capsule TAKE 1 CAPSULE BY MOUTH  DAILY 90 capsule 1  . doxycycline (MONODOX) 100 MG capsule Take 100 mg by mouth 2 (two) times daily.    . furosemide (LASIX) 40 MG tablet TAKE 2 TABLETS BY MOUTH TWO TIMES DAILY 360 tablet 1  . Glutamine 500 MG CAPS Take by mouth.    Marland Kitchen. HUMALOG MIX 75/25 KWIKPEN (75-25) 100 UNIT/ML Kwikpen INJECT SUBCUTANEOUSLY 25  UNITS EVERY MORNING AND 20  UNITS EVERY EVENING AT  SUPPER 45 mL 3  . HYDROcodone-acetaminophen (NORCO/VICODIN) 5-325 MG tablet 1-2 tabs po bid prn pain 60 tablet 0  . Insulin Pen Needle (B-D ULTRAFINE III SHORT PEN) 31G X 8 MM MISC USE 1  TWICE DAILY 100 each 0  . Insulin Pen Needle (BD ULTRA-FINE PEN NEEDLES) 29G X 12.7MM MISC 1 each by Other route 2 (two) times daily. 200 each 2  . Insulin Pen Needle (BD ULTRA-FINE PEN NEEDLES) 29G X 12.7MM MISC USE TWICE DAILY 200 each 3  . lisinopril (ZESTRIL) 40 MG tablet TAKE 1 TABLET BY MOUTH  EVERY DAY 90 tablet 1  . metFORMIN (GLUCOPHAGE) 1000 MG tablet TAKE 1 TABLET BY MOUTH  TWICE A DAY WITH MEALS 180 tablet 1  . metoprolol tartrate (LOPRESSOR) 50 MG tablet Take 25 mg by mouth 2 (two) times daily.    . Multiple Vitamin (MULTIVITAMIN) tablet Take 1 tablet by mouth daily.    [name]. ONETOUCH ULTRA test strip CHECK BLOOD SUGAR TWO TIMES DAILY 200 strip 3  . rivaroxaban (XARELTO) 20 MG TABS tablet TAKE 1 TABLET BY MOUTH ONCE DAILY WITH SUPPER 90 tablet 3  . saw palmetto 500 MG capsule Take 500 mg by mouth daily.    Marland Kitchen. terazosin (HYTRIN) 10 MG capsule TAKE 1 CAPSULE BY MOUTH  ONCE A DAY AT BEDTIME 90 capsule 3  . vitamin E 400 UNIT capsule Take 400 Units by mouth every morning. Selenium 50mg       Facility-Administered Medications Prior to Visit  Medication Dose Route Frequency Provider Last Rate Last Dose  . penicillin g benzathine (BICILLIN LA) 1200000 UNIT/2ML injection 600,000 Units  600,000 Units Intramuscular Q30 days Jeoffrey MassedMcGowen, Auryn Paige H, MD   600,000 Units at 04/05/19 0905  . penicillin g benzathine (BICILLIN LA) 1200000 UNIT/2ML injection 600,000 Units  600,000 Units Intramuscular Q30 days Jeoffrey MassedMcGowen, Artyom Stencel H, MD   600,000 Units at 04/05/19 0905  . penicillin g benzathine (BICILLIN LA) 1200000 UNIT/2ML injection 600,000 Units  600,000 Units Intramuscular Q30 days  [name], MD   600,000 Units at 09/14/19 [phone]  . penicillin g benzathine (BICILLIN LA) 1200000 UNIT/2ML injection 600,000 Units  600,000 Units Intramuscular Q30 days [name], MD   600,000 Units at 09/14/19 [id number]    Allergies  Allergen Reactions  . Other Other (See Comments)    Sneezing, coughing    ROS As per HPI  PE: Blood pressure (!) 171/81, pulse 71, temperature 98.2 F (36.8 C), temperature source Temporal, resp. rate 16, height 5\' 10"  (1.778 m), weight (!) 382 lb 12.8 oz (173.6 kg), SpO2 97 %. Body mass index is 54.93 kg/m.  Gen: Alert, well appearing.  Patient is oriented to person, place, time, and situation. AFFECT: pleasant, lucid thought and speech. : no injection, icteris, swelling, or exudate.  EOMI, PERRLA. Mouth: lips without lesion/swelling.  Oral mucosa pink and moist. Oropharynx without erythema, exudate, or swelling.  CV: RRR, no m/r/g.   LUNGS: CTA bilat, nonlabored resps, good aeration in all lung fields. EXT: no clubbing or cyanosis.  He has significant nonpitting LL bilat edema, wearing unna boots on both LLs today.    LABS:  Lab Results  Component Value Date   TSH 4.40 06/08/2018   Lab Results  Component Value Date   WBC 6.1 07/06/2019   HGB 13.1 07/06/2019   HCT 40.3 07/06/2019   MCV 84.3 07/06/2019   PLT 210.0 07/06/2019   Lab Results  Component  Value Date   CREATININE 1.26 07/06/2019   BUN 20 07/06/2019   NA 137 07/06/2019   K 4.5 07/06/2019   CL 101 07/06/2019   CO2 28 07/06/2019   Lab Results  Component Value Date   ALT 10 07/06/2019   AST 17 07/06/2019   ALKPHOS 76 07/06/2019   BILITOT 1.0 07/06/2019   Lab Results  Component Value Date   CHOL 106 07/06/2019   Lab Results  Component Value Date   HDL 40.20 07/06/2019   Lab Results  Component Value Date   LDLCALC 55 07/06/2019   Lab Results  Component Value Date   TRIG 55.0 07/06/2019   Lab Results  Component Value Date   CHOLHDL 3 07/06/2019   Lab Results  Component Value Date   PSA 0.71 07/06/2019   PSA 0.26 03/09/2018   PSA 0.30 12/09/2016    Lab Results  Component Value Date   HGBA1C 7.5 (H) 07/06/2019    IMPRESSION AND PLAN:  1) DM 2. Home gluc's not well controlled. Last A1c was decent. A1c and BMET today. May need to bump insulin dosing up (or have him take insulin more regularly-->he often "holds" his dose if gluc <160.  2) HTN: The current medical regimen is effective;  continue present plan and medications. Up today but has not taken meds today + home monitoring consistently normal. Lytes/cr today.  3) CRI III: avoids NSAIDs. His fluid intake is a little low due (has CHF) so if BUN/Cr up we'll have him increase this some.  4) chronic LL lymphedema and recurrent cellulitis.  Wound care center mgmt, f/u today, continue doxycycline as per their latest rx and will give his monthly penicillin injection today.  5) Preventative health: Will send Tdap rx to pharmacy and we'll resend the shingrix rx that pharmacy states they never received.  6) Chronic pain syndrome.  The current medical regimen is effective;  continue present plan and medications. I did electronic rx's for vicodin 5/325, 1-2 bid prn, #60 today for each of the next 3 mo.  Appropriate fill on/after date was noted on each rx. [facility] UTD.  An After Visit Summary was printed  and given to the patient.  FOLLOW UP: Return in about 3 months (around 01/04/2020) for routine chronic illness f/u.  Signed:  Crissie Sickles, MD           10/06/2019

## 2019-10-07 NOTE — Progress Notes (Signed)
[name], [name] ([id number]) Visit Report for 10/06/2019 Chief Complaint Document Details Patient Name: Date of Service: [name], [name] 10/06/2019 11:00 AM Medical Record [id number] Patient Account Number: [id number] Date of Birth/Sex: Treating RN: 1950-11-30 (68 y.o. M) Primary Care Provider: Nicoletta Ba Other Clinician: Referring Provider: Treating Provider/Extender:[name], [name], [name] in Treatment: 193 Information Obtained from: Patient Chief Complaint patient is here for evaluation venous/lymphedema weeping Electronic Signature(s) Signed: 10/07/2019 10:36:32 AM By: Lenda Kelp PA-C Entered By: Lenda Kelp on 10/06/2019 12:14:03 -------------------------------------------------------------------------------- HPI Details Patient Name: Date of Service: [name], [name] 10/06/2019 11:00 AM Medical Record [id number] Patient Account Number: [id number] Date of Birth/Sex: Treating RN: [birth year] (68 y.o. M) Primary Care Provider: Nicoletta Ba Other Clinician: Referring Provider: Treating Provider/Extender:[name], [name], [name] in Treatment: 193 History of Present Illness HPI Description: Referred by PCP for consultation. Patient has long standing history of BLE venous stasis, no prior ulcerations. At beginning of month, developed cellulitis and weeping. Received IM Rocephin followed by Keflex and resolved. Wears compression stocking, appr 6 months old. Not sure strength. No present drainage. 01/22/16 this is a patient who is a type II diabetic on insulin. 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 He tells me he religiously wears pressure stockings of uncertain strength. He was here with weeping edema about 8 months ago but did not have an open wound. Roughly a month ago he had a reopening on his bilateral legs. He is been using bandages and Neosporin. He does not complain of pain. 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 Last BUN/creatinine I have is from 11/20/15 at 13 and 1.0 respectively 01/29/16; patient arrives today having tolerated the Profore wrap. He brought in his stockings and these are 18 mmHg stockings he bought from [employer]. The compression here is likely inadequate. He does not complain of pain or excessive drainage she has no systemic symptoms. The wound on the right looks improved as does the one on the left although one on the left is more substantial with still tissue at risk below the actual wound area on the bilateral posterior calf 02/05/16; patient arrives with poor edema control. He states that we did put a 4 layer compression on it last week. No weight appear 5 this. 02/12/16; the area on the posterior right Has healed. The left Has a substantial wound that has necrotic surface eschar that requires a debridement with a curette. 02/16/16;the patient called or a Nurse visit secondary to increased swelling. He had been in earlier in the week with his right leg healed. He was transitioned to is on pressure stocking on the right leg with the only open wound on the left, a substantial area on the left posterior calf. Note he has a history of severe lower extremity edema, he has a history of chronic atrial fibrillation but not heart failure per my notes but I'll need to research this. He is not complaining of chest pain shortness of breath or orthopnea. The intake nurse noted blisters on the previously closed right leg 02/19/16; this is the patient's regular visit day. I see him on Friday with escalating edema new wounds on the right leg and clear signs of at least right ventricular heart failure. I increased his Lasix to 40 twice a day. He is returning currently in follow-up. States he is noticed a decrease  in that the edema 02/26/16 patient's legs have much  less edema. There is nothing really open on the right leg. The left leg has improved condition of the large superficial wound on the posterior left leg 03/04/16; edema control is very much better. The patient's right leg wounds have healed. On the left leg he continues to have severe venous inflammation on the posterior aspect of the left leg. There is no tenderness and I don't think any of this is cellulitis. 03/11/16; patient's right leg is married healed and he is in his own stocking. The patient's left leg has deteriorated somewhat. There is a lot of erythema around the wound on the posterior left leg. There is also a significant rim of erythema posteriorly just above where the wrap would've ended there is a new wound in this location and a lot of tenderness. Can't rule out cellulitis in this area. 03/15/16; patient's right leg remains healed and he is in his own stocking. The patient's left leg is much better than last review. His major wound on the posterior aspect of his left Is almost fully epithelialized. He has 3 small injuries from the wraps. Really. Erythema seems a lot better on antibiotics 03/18/16; right leg remains healed and he is in his own stocking. The patient's left leg is much better. The area on the posterior aspect of the left calf is fully epithelialized. His 3 small injuries which were wrap injuries on the left are improved only one seems still open his erythema has resolved 03/25/16; patient's right leg remains healed and he is in his own stocking. There is no open area today on the left leg posterior leg is completely closed up. His wrap injuries at the superior aspect of his leg are also resolved. He looks as though he has some irritation on the dorsal ankle but this is fully epithelialized without evidence of infection. 03/28/16; we discharged this patient on Monday. Transitioned him into his own stocking. There were problems almost immediately with uncontrolled swelling  weeping edema multiple some of which have opened. He does not feel systemically unwell in particular no chest pain no shortness of breath and he does not feel 04/08/16; the edema is under better control with the Profore light wrap but he still has pitting edema. There is one large wound anteriorly 2 on the medial aspect of his left leg and 3 small areas on the superior posterior calf. Drainage is not excessive he is tolerating a Profore light well 04/15/16; put a Profore wrap on him last week. This is controlled is edema however he had a lot of pain on his left anterior foot most of his wounds are healed 04/22/16 once again the patient has denuded areas on the left anterior foot which he states are because his wrap slips up word. He saw his primary physician today is on Lasix 40 twice a day and states that he his weight is down 20 pounds over the last 3 months. 04/29/16: Much improved. left anterior foot much improved. He is now on Lasix 80 mg per day. Much improved edema control 05/06/16; I was hoping to be able to discharge him today however once again he has blisters at a low level of where the compression was placed last week mostly on his left lateral but also his left medial leg and a small area on the anterior part of the left foot. 05/09/16; apparently the patient went home after his appointment on 7/4 later in the evening developing pain  in his upper medial thigh together with subjective fever and chills although his temperature was not taken. The pain was so intense he felt he would probably have to call 911. However he then remembered that he had leftover doxycycline from a previous round of antibiotics and took these. By the next morning he felt a lot better. He called and spoke to one of our nurses and I approved doxycycline over the phone thinking that this was in relation to the wounds we had previously seen although they were definitely were not. The patient feels a lot better old fever no  chills he is still working. Blood sugars are reasonably controlled 05/13/16; patient is back in for review of his cellulitis on his anterior medial upper thigh. He is taking doxycycline this is a lot better. Culture I did of the nodular area on the dorsal aspect of his foot grew MRSA this also looks a lot better. 05/20/16; the patient is cellulitis on the medial upper thigh has resolved. All of his wound areas including the left anterior foot, areas on the medial aspect of the left calf and the lateral aspect of the calf at all resolved. He has a new blister on the left dorsal foot at the level of the fourth toe this was excised. No evidence of infection 05/27/16; patient continues to complain weeping edema. He has new blisterlike wounds on the left anterior lateral and posterior lateral calf at the top of his wrap levels. The area on his left anterior foot appears better. He is not complaining of fever, pain or pruritus in his feet. 05/30/16; the patient's blisters on his left anterior leg posterior calf all look improved. He did not increase the Lasix 100 mg as I suggested because he was going to run out of his 40 mg tablets. He is still having weeping edema of his toes 06/03/16; I renewed his Lasix at 80 mg once a day as he was about to run out when I last saw him. He is on 80 mg of Lasix now. I have asked him to cut down on the excessive amount of water he was drinking and asked him to drink according to his thirst mechanisms 06/12/2016 -- was seen 2 days ago and was supposed to wear his compression stockings at home but he is developed lymphedema and superficial blisters on the left lower extremity and hence came in for a review 06/24/16; the remaining wound is on his left anterior leg. He still has edema coming from between his toes. There is lymphedema here however his edema is generally better than when I last saw this. He has a history of atrial fibrillation but does not have a known history  of congestive heart failure nevertheless I think he probably has this at least on a diastolic basis. 07/01/16 I reviewed his echocardiogram from January 2017. This was essentially normal. He did not have LVH, EF of 55-60%. His right ventricular function was normal although he did have trivial tricuspid and pulmonic regurgitation. This is not audible on exam however. I increased his Lasix to do massive edema in his legs well above his knees I think in early July. He was also drinking an excessive amount of water at the time. 07/15/16; missed his appointment last week because of the Labor Day holiday on Monday. He could not get another appointment later in the week. Started to feel the wrap digging in superiorly so we remove the top half and the bottom half of his wrap.  He has extensive erythema and blistering superiorly in the left leg. Very tender. Very swollen. Edema in his foot with leaking edema fluid. He has not been systemically unwell 07/22/16; the area on the left leg laterally required some debridement. The medial wounds look more stable. His wrap injury wounds appear to have healed. Edema and his foot is better, weeping edema is also better. He tells me he is meeting with the supplier of the external compression pumps at work 08/05/16; the patient was on vacation last week in [facility]. His wrap is been on for an extended period of time. Also over the weekend he developed an extensive area of tender erythema across his anterior medial thigh. He took to doxycycline yesterday that he had leftover from a previous prescription. The patient complains of weeping edema coming out of his toes 08/08/16; I saw this patient on 10/2. He was tender across his anterior thigh. I put him on doxycycline. He returns today in follow-up. He does not have any open wounds on his lower leg, he still has edema weeping into his toes. 08/12/16; patient was seen back urgently today to follow-up for his extensive left  thigh cellulitis/erysipelas. He comes back with a lot less swelling and erythema pain is much better. I believe I gave him Augmentin and Cipro. His wrap was cut down as he stated a roll down his legs. He developed blistering above the level of the wrap that remained. He has 2 open blisters and 1 intact. 08/19/16; patient is been doing his primary doctor who is increased his Lasix from 40-80 once a day or 80 already has less edema. Cellulitis has remained improved in the left thigh. 2 open areas on the posterior left calf 08/26/16; he returns today having new open blisters on the anterior part of his left leg. He has his compression pumps but is not yet been shown how to use some vital representative from the supplier. 09/02/16 patient returns today with no open wounds on the left leg. Some maceration in his plantar toes 09/10/2016 -- Dr. Leanord Hawking had recently discharged him on 09/02/2016 and he has come right back with redness swelling and some open ulcers on his left lower extremity. He says this was caused by trying to apply his compression stockings and he's been unable to use this and has not been able to use his lymphedema pumps. He had some doxycycline leftover and he has started on this a few days ago. 09/16/16; there are no open wounds on his leg on the left and no evidence of cellulitis. 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 He does not complain of symptoms here. I am not clear use using his external compression pumps. 09/23/16; I have not seen this patient in 2 weeks. He canceled his appointment 10 days ago as he was going on vacation. He tells me that on Monday he noticed a large area on his posterior left leg which is been draining copiously and is reopened into a large wound. He is been using ABDs and the external part of his juxtalite, according to our nurse this was not on properly. 10/07/16; Still a substantial area on the  posterior left leg. Using silver alginate 10/14/16; in general better although there is still open area which looks healthy. Still using silver alginate. He reminds me that this happen before he left for [facility]. Today while he was showering in the morning. He had been using  his juxtalite's 10/21/16; the area on his posterior left leg is fully epithelialized. However he arrives today with a large area of tender erythema in his medial and posterior left thigh just above the knee. I have marked the area. Once again he is reluctant to consider hospitalization. I treated him with oral antibiotics in the past for a similar situation with resolution I think with doxycycline however this area it seems more extensive to me. He is not complaining of fever but does have chills and says states he is thirsty. His blood sugar today was in the 140s at home 10/25/16 the area on his posterior left leg is fully epithelialized although there is still some weeping edema. The large area of tenderness and erythema in his medial and posterior left thigh is a lot less tender although there is still a lot of swelling in this thigh. He states he feels a lot better. He is on doxycycline and Augmentin that I started last week. This will continued until Tuesday, December 26. I have ordered a duplex ultrasound of the left thigh rule out DVT whether there is an abscess something that would need to be drained I would also like to know. 11/01/16; he still has weeping edema from a not fully epithelialized area on his left posterior calf. Most of the rest of this looks a lot better. He has completed his antibiotics. His thigh is a lot better. Duplex ultrasound did not show a DVT in the thigh 11/08/16; he comes in today with more Denuded surface epithelium from the posterior aspect of his calf. There is no real evidence of cellulitis. The superior aspect of his wrap appears to have put quite an indentation in his leg just below  the knee and this may have contributed. He does not complain of pain or fever. We have been using silver alginate as the primary dressing. The area of cellulitis in the right thigh has totally resolved. He has been using his compression stockings once a week 11/15/16; the patient arrives today with more loss of epithelium from the posterior aspect of his left calf. He now has a fairly substantial wound in this area. The reason behind this deterioration isn't exactly clear although his edema is not well controlled. He states he feels he is generally more swollen systemically. He is not complaining of chest pain shortness of breath fever. Tells me he has an appointment with his primary physician in early February. He is on 80 mg of oral Lasix a day. He claims compliance with the external compression pumps. He is not having any pain in his legs similar to what he has with his recurrent cellulitis 11/22/16; the patient arrives a follow-up of his large area on his left lateral calf. This looks somewhat better today. He came in earlier in the week for a dressing change since I saw him a week ago. He is not complaining of any pain no shortness of breath no chest pain 11/28/16; the patient arrives for follow-up of his large area on the left lateral calf this does not look better. In fact it is larger weeping edema. The surface of the wound does not look too bad. We have been using silver alginate although I'm not certain that this is a dressing issue. 12/05/16; again the patient follows up for a large wound on the left lateral and left posterior calf this does not look better. There continues to be weeping edema necrotic surface tissue. More worrisome than this once again there  is erythema below the wound involving the distal Achilles and heel suggestive of cellulitis. He is on his feet working most of the day of this is not going well. We are changing his dressing twice a week to facilitate the  drainage. 12/12/16; not much change in the overall dimensions of the large area on the left posterior calf. This is very inflamed looking. I gave him an. Doxycycline last week does not really seem to have helped. He found the wrap very painful indeed it seems to of dog into his legs superiorly and perhaps around the heel. He came in early today because the drainage had soaked through his dressings. 12/19/16- patient arrives for follow-up evaluation of his left lower extremity ulcers. He states that he is using his lymphedema pumps once daily when there is "no drainage". He admits to not using his lipedema pumps while under current treatment. His blood sugars have been consistently between 150-200. 12/26/16; the patient is not using his compression pumps at home because of the wetness on his feet. I've advised him that I think it's important for him to use this daily. He finds his feet too wet, he can put a plastic bag over his legs while he is in the pumps. Otherwise I think will be in a vicious circle. We are using silver alginate to the major area on his left posterior calf 01/02/17; the patient's posterior left leg has further of all into 3 open wounds. All of them covered with a necrotic surface. He claims to be using his compression pumps once a day. His edema control is marginal. Continue with silver alginate 01/10/17; the patient's left posterior leg actually looks somewhat better. There is less edema, less erythema. Still has 3 open areas covered with a necrotic surface requiring debridement. He claims to be using his compression pumps once a day his edema control is better 01/17/17; the patient's left posterior calf look better last week when I saw him and his wrap was changed 2 days ago. He has noted increasing pain in the left heel and arrives today with much larger wounds extensive erythema extending down into the entire heel area especially tender medially. He is not systemically unwell CBGs  have been controlled no fever. Our intake nurse showed me limegreen drainage on his AVD pads. 01/24/17; his usual this patient responds nicely to antibiotics last week giving him Levaquin for presumed Pseudomonas. The whole entire posterior part of his leg is much better much less inflamed and in the case of his Achilles heel area much less tender. He has also had some epithelialization posteriorly there are still open areas here and still draining but overall considerably better 01/31/17- He has continue to tolerate the compression wraps. he states that he continues to use the lymphedema pumps daily, and can increase to twice daily on the weekends. He is voicing no complaints or concerns regarding his LLE ulcers 02/07/17-he is here for follow-up evaluation. He states that he noted some erythema to the left medial and anterior thigh, which he states is new as of yesterday. He is concerned about recurrent cellulitis. He states his blood sugars have been slightly elevated, this morning in the 180s 02/14/17; he is here for follow-up evaluation. When he was last here there was erythema superiorly from his posterior wound in his anterior thigh. He was prescribed Levaquin however a culture of the wound surface grew MRSA over the phone I changed him to doxycycline on Monday and things seem to be a  lot better. 02/24/17; patient missed his appointment on Friday therefore we changed his nurse visit into a physician visit today. Still using silver alginate on the large area of the posterior left thigh. He isn't new area on the dorsal left second toe 03/03/17; actually better today although he admits he has not used his external compression pumps in the last 2 days or so because of work responsibilities over the weekend. 03/10/17; continued improvement. External compression pumps once a day almost all of his wounds have closed on the posterior left calf. Better edema control 03/17/17; in general improved. He still  has 3 small open areas on the lateral aspect of his left leg however most of the area on the posterior part of his leg is epithelialized. He has better edema control. He has an ABD pad under his stocking on the right anterior lower leg although he did not let us look at that today. 03/24/17; patient arrives back in clinic today with no open areas however there are areas on the posterior left calf and anterior left calf that are less than 100% epithelialized. His edema is well controlled in the left lower leg. There is some pitting edema probably lymphedema in the left upper thigh. He uses compression pumps at home once per day. I tried to get him to do this twice a day although he is very reticent. 04/01/2017 -- for the last 2 days he's had significant redness, tenderness and weeping and came in for an urgent visit today. 04/07/17; patient still has 6 more days of doxycycline. He was seen by Dr. Meyer Russel last Wednesday for cellulitis involving the posterior aspect, lateral aspect of his Involving his heel. For the most part he is better there is less erythema and less weeping. He has been on his feet for 12 hours 2 over the weekend. Using his compression pumps once a day 04/14/17 arrives today with continued improvement. Only one area on the posterior left calf that is not fully epithelialized. He has intense bilateral venous inflammation associated with his chronic venous insufficiency disease and secondary lymphedema. We have been using silver alginate to the left posterior calf wound In passing he tells [place] today that the right leg but we have not seen in quite some time has an open area on it but he doesn't want [place] to look at this today states he will show this to [place] next week. 04/21/17; there is no open area on his left leg although he still reports some weeping edema. He showed [place] his right leg today which is the first time we've seen this leg in a long time. He has a large area of open wound on the  right leg anteriorly healthy granulation. Quite a bit of swelling in the right leg and some degree of venous inflammation. He told us about the right leg in passing last week but states that deterioration in the right leg really only happened over the weekend 04/28/17; there is no open area on the left leg although there is an irritated part on the posterior which is like a wrap injury. The wound on the right leg which was new from last week at least to [place] is a lot better. 05/05/17; still no open area on the left leg. Patient is using his new compression stocking which seems to be doing a good job of controlling the edema. He states he is using his compression pumps once per day. The right leg still has an open wound although it  is better in terms of surface area. Required debridement. A lot of pain in the posterior right Achilles marked tenderness. Usually this type of presentation this patient gives concern for an active cellulitis 05/12/17; patient arrives today with his major wound from last week on the right lateral leg somewhat better. Still requiring debridement. He was using his compression stocking on the left leg however that is reopened with superficial wounds anteriorly he did not have an open wound on this leg previously. He is still using his juxta light's once daily at night. He cannot find the time to do this in the morning as he has to be at work by 7 AM 05/19/17; right lateral leg wound looks improved. No debridement required. The concerning area is on the left posterior leg which appears to almost have a subcutaneous hemorrhagic component to it. We've been using silver alginate to all the wounds 05/26/17; the right lateral leg wound continues to look improved. However the area on the left posterior calf is a tightly adherent surface. [place] using silver alginate. Because of the weeping edema in his legs there is very little good alternatives. 06/02/17; the patient left here last week  looking quite good. Major wound on the left posterior calf and a small one on the right lateral calf. Both of these look satisfactory. He tells me that by Wednesday he had noted increased pain in the left leg and drainage. He called on Thursday and Friday to get an appointment here but we were blocked. He did not go to urgent care or his primary physician. He thinks he had a fever on Thursday but did not actually take his temperature. He has not been using his compression pumps on the left leg because of pain. I advised him to go to the emergency room today for IV antibiotics for stents of left leg cellulitis but he has refused I have asked him to take 2 days off work to keep his leg elevated and he has refused this as well. In view of this I'm going to call him and Augmentin and doxycycline. 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 However after a detailed discussion regarding the risk benefits and alternatives he refuses to get admitted to the hospital. With no other recourse I will continue him on oral antibiotics as before and hopefully he'll have his infectious disease consultation this week. 06/16/2017 -- the patient was seen today by the nurse practitioner at infectious disease Ms. Dixon. 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 He has also been advise Lotrimin cream locally. she also had wise IV antibiotics for his condition if it flares up 06/23/17; patient arrives today with drainage bilaterally although the remaining wound on the left posterior calf after cleaning up  today "highlighter yellow drainage" did not look too bad. Unfortunately he has had breakdown on the right anterior leg [previously this leg had not been open and he is using a black stocking] he went to see infectious disease and is been put on clindamycin 150 mg daily, I did not verify the  dose although I'm not familiar with using clindamycin in this dosing range, perhaps for prophylaxisoo 06/27/17; I brought this patient back today to follow-up on the wound deterioration on the right lower leg together with surrounding cellulitis. I started him on doxycycline 4 days ago. This area looks better however he comes in today with intense cellulitis on the medial part of his left thigh. This is not have a wound in this area. Extremely tender. We've been using silver alginate to the wounds on the right lower leg left lower leg with bilateral 4 layer compression he is using his external compression pumps once a day 07/04/17; patient's left medial thigh cellulitis looks better. He has not been using his compression pumps as his insert said it was contraindicated with cellulitis. His right leg continues to make improvements all the wounds are still open. We only have one remaining wound on the left posterior calf. Using silver alginate to all open areas. He is on doxycycline which I started a week ago and should be finishing I gave him Augmentin after Thursday's visit for the severe cellulitis on the left medial thigh which fortunately looks better 07/14/17; the patient's left medial thigh cellulitis has resolved. The cellulitis in his right lower calf on the right also looks better. All of his wounds are stable to improved we've been using silver alginate he has completed the antibiotics I have given him. He has clindamycin 150 mg once a day prescribed by infectious disease for prophylaxis, I've advised him to start this now. We have been using bilateral Unna boots over silver alginate to the wound  areas 07/21/17; the patient is been to see infectious disease who noted his recurrent problems with cellulitis. He was not able to tolerate prophylactic clindamycin therefore he is on amoxicillin 500 twice a day. He also had a second daily dose of Lasix added By Dr. Melford Aase but he is not taking this. Nor is he being completely compliant with his compression pumps a especially not this week. He has 2 remaining wounds one on the right posterior lateral lower leg and one on the left posterior medial lower leg. 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 The patient has Unna boots bilaterally. 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 He has bilateral Unna boots for compression and silver alginate to his wounds. Arrives today with his legs looking as good as I have seen him in quite some time. Not surprisingly his wounds look better as well with improvement on the right lateral leg venous insufficiency wound and also the left medial leg. He is still using the compression pumps once a day 08/11/17; both legs appear to be doing better wounds on the right lateral and left medial legs look better. Skin on the right leg quite good. He is been using silver alginate as the primary dressing. I'm going to use Anasept gel calcium alginate and maintain all the secondary dressings 08/18/17; the patient continues to actually do quite well. The area on his right lateral leg is just about closed the left medial also looks better although it is still moist in this area. His edema is well controlled we have been using Anasept gel with calcium alginate and the usual secondary dressings, 4 layer compression and once daily use of his compression  pumps "always been able to manage 09/01/17; the  patient continues to do reasonably well in spite of his trip to [place]. The area on the right lateral leg is epithelialized. Left is much better but still open. He has more edema and more chronic erythema on the left leg [venous inflammation] 09/08/17; he arrives today with no open wound on the right lateral leg and decently controlled edema. Unfortunately his left leg is not nearly as in his good situation as last week.he apparently had increasing edema starting on Saturday. He edema soaked through into his foot so used a plastic bag to walk around his home. The area on the medial right leg which was his open area is about the same however he has lost surface epithelium on the left lateral which is new and he has significant pain in the Achilles area of the left foot. He is already on amoxicillin chronically for prophylaxis of cellulitis in the left leg 09/15/17; he is completed a week of doxycycline and the cellulitis in the left posterior leg and Achilles area is as usual improved. He still has a lot of edema and fluid soaking through his dressings. There is no open wound on the right leg. He saw infectious disease NP today 09/22/17;As usual 1 we transition him from our compression wraps to his stockings things did not go well. He has several small open areas on the right leg. He states this was caused by the compression wrap on his skin although he did not wear this with the stockings over them. He has several superficial areas on the left leg medially laterally posteriorly. He does not have any evidence of active cellulitis especially involving the left Achilles The patient is traveling from [facility] Saturday going to [facility]. He states he isn't attempting to get an appointment with a heel objects wound center there to change his dressings. I am not completely certain whether this will work 10/06/17; the patient came in on Friday for a nurse visit and the nurse reported that his legs  actually look quite good. He arrives in clinic today for his regular follow-up visit. He has a new wound on his left third toe over the PIP probably caused by friction with his footwear. He has small areas on the left leg and a very superficial but epithelialized area on the right anterior lateral lower leg. Other than that his legs look as good as I've seen him in quite some time. We have been using silver alginate Review of systems; no chest pain no shortness of breath other than this a 10 point review of systems negative 10/20/17; seen by Dr. Meyer Russel last week. He had taken some antibiotics [doxycycline] that he had left over. Dr. Meyer Russel thought he had candida infection and declined to give him further antibiotics. He has a small wound remaining on the right lateral leg several areas on the left leg including a larger area on the left posterior several left medial and anterior and a small wound on the left lateral. The area on the left dorsal third toe looks a lot better. ROS; Gen.; no fever, respiratory no cough no sputum Cardiac no chest pain other than this 10 point review of system is negative 10/30/17; patient arrives today having fallen in the bathtub 3 days ago. It took him a while to get up. He has pain and maceration in the wounds on his left leg which have deteriorated. He has not been using his pumps he also has some  maceration on the right lateral leg. 11/03/17; patient continues to have weeping edema especially in the left leg. This saturates his dressings which were just put on on 12/27. As usual the doxycycline seems to take care of the cellulitis on his lower leg. He is not complaining of fever, chills, or other systemic symptoms. He states his leg feels a lot better on the doxycycline I gave him empirically. He also apparently gets injections at his primary doctor's officeo Rocephin for cellulitis prophylaxis. I didn't ask him about his compression pump compliance today I think  that's probably marginal. Arrives in the clinic with all of his dressings primary and secondary macerated full of fluid and he has bilateral edema 11/10/17; the patient's right leg looks some better although there is still a cluster of wounds on the right lateral. The left leg is inflamed with almost circumferential skin loss medially to laterally although we are still maintaining anteriorly. He does not have overt cellulitis there is a lot of drainage. He is not using compression pumps. We have been using silver alginate to the wound areas, there are not a lot of options here 11/17/17; the patient's right leg continues to be stable although there is still open wounds, better than last week. The inflammation in the left leg is better. Still loss of surface layer epithelium especially posteriorly. There is no overt cellulitis in the amount of edema and his left leg is really quite good, tells me he is using his compression pumps once a day. 11/24/17; patient's right leg has a small superficial wound laterally this continues to improve. The inflammation in the left leg is still improving however we have continuous surface layer epithelial loss posteriorly. There is no overt cellulitis in the amount of edema in both legs is really quite good. He states he is using his compression pumps on the left leg once a day for 5 out of 7 days 12/01/17; very small superficial areas on the right lateral leg continue to improve. Edema control in both legs is better today. He has continued loss of surface epithelialization and left posterior calf although I think this is better. We have been using silver alginate with large number of absorptive secondary dressings 4 layer on the left Unna boot on the right at his request. He tells me he is using his compression pumps once a day 12/08/17; he has no open area on the right leg is edema control is good here. On the left leg however he has marked erythema and tenderness  breakdown of skin. He has what appears to be a wrap injury just distal to the popliteal fossa. 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 There was swelling erythema and breakdown in the right leg. He has much worse skin breakdown in the left leg as well multiple open areas medially and posteriorly as well as laterally. He tells me he has been using his compression pumps but tells me he feels that the drainage out of his leg is worse when he uses a compression pumps. To be fair to him he is been saying this for a while however I don't know that I have really been listening to this. I wonder if the compression pumps are  working properly 12/22/17;. Once again he arrives with severe erythema, weeping edema from the left greater than right leg. Noncompliance with compression pumps. New this visit he is complaining of pain on the lateral aspect of the right leg and the medial aspect of his right thigh. He apparently saw his cardiologist Dr. [name] who was ordered an echocardiogram area and I think this is a step in the right direction 12/25/17; started his doxycycline Monday night. There is still intense erythema of the right leg especially in the anterior thigh although there is less tenderness. The erythema around the wound on the right lateral calf also is less tender. He still complaining of pain in the left heel. His wounds are about the same right lateral left medial left lateral. Superficial but certainly not close to closure. He denies being systemically unwell no fever chills no abdominal pain no diarrhea 12/29/17; back in follow-up of his extensive right calf and right thigh cellulitis. I added amoxicillin to cover  possible doxycycline resistant strep. This seems to of done the trick he is in much less pain there is much less erythema and swelling. He has his echocardiogram at 11:00 this morning. X-ray of the left heel was also negative. 01/05/18; the patient arrived with his edema under much better control. Now that he is retired he is able to use his compression pumps daily and sometimes twice a day per the patient. He has a wound on the right leg the lateral wound looks better. Area on the left leg also looks a lot better. He has no evidence of cellulitis in his bilateral thighs I had a quick peak at his echocardiogram. He is in normal ejection fraction and normal left ventricular function. He has moderate pulmonary hypertension moderately reduced right ventricular function. One would have to wonder about chronic sleep apnea although he says he doesn't snore. He'll review the echocardiogram with his cardiologist. 01/12/18; the patient arrives with the edema in both legs under exemplary control. He is using his compression pumps daily and sometimes twice daily. His wound on the right lateral leg is just about closed. He still has some weeping areas on the posterior left calf and lateral left calf although everything is just about closed here as well. I have spoken with Aldean Baker who is the patient's nurse practitioner and infectious disease. She was concerned that the patient had not understood that the parenteral penicillin injections he was receiving for cellulitis prophylaxis was actually benefiting him. I don't think the patient actually saw that I would tend to agree we were certainly dealing with less infections although he had a serious one last month. 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 He is healed. He'll be placed in juxtalite compression wraps and increase his lymphedema pumps to twice daily. We will follow up again next week to ensure there are no  issues with the new regiment. 01/20/18-he is here for evaluation of bilateral lower extremity weeping edema. Yesterday he was placed in compression wrap to the right lower extremity and compression stocking to left lower shrubbery. He states he uses lymphedema pumps last night and again this morning and noted a blister to the left lower extremity. On exam he was noted to have drainage to the right lower extremity. He will be placed in Unna boots bilaterally and follow-up next week 01/26/18; patient was actually discharged a week ago to his own juxta light stockings only to return the next day with bilateral lower  extremity weeping edema.he was placed in bilateral Unna boots. He arrives today with pain in the back of his left leg. There is no open area on the right leg however there is a linear/wrap injury on the left leg and weeping edema on the left leg posteriorly. I spoke with infectious disease about 10 days ago. They were disappointed that the patient elected to discontinue prophylactic intramuscular penicillin shots as they felt it was particularly beneficial in reducing the frequency of his cellulitis. I discussed this with the patient today. He does not share this view. He'll definitely need antibiotics today. Finally he is traveling to [place] and trauma leaving this Saturday and returning a week later and he does not travel with his pumps. He is going by car 01/30/18; patient was seen 4 days ago and brought back in today for review of cellulitis in the left leg posteriorly. I put him on amoxicillin this really hasn't helped as much as I might like. He is also worried because he is traveling to [facility] trauma by car. Finally we will be rewrapping him. There is no open area on the right leg over his left leg has multiple weeping areas as usual 02/09/18; The same wrap on for 10 days. He did not pick up the last doxycycline I prescribed for him. He apparently took 4 days worth he already had.  There is nothing open on his right leg and the edema control is really quite good. He's had damage in the left leg medially and laterally especially probably related to the prolonged use of Unna boots 02/12/18; the patient arrived in clinic today for a nurse visit/wrap change. He complained of a lot of pain in the left posterior calf. He is taking doxycycline that I previously prescribed for him. Unfortunately even though he used his stockings and apparently used to compression pumps twice a day he has weeping edema coming out of the lateral part of his right leg. This is coming from the lower anterior lateral skin area. 02/16/18; the patient has finished his doxycycline and will finish the amoxicillin 2 days. The area of cellulitis in the left calf posteriorly has resolved. He is no longer having any pain. He tells me he is using his compression pumps at least once a day sometimes twice. 02/23/18; the patient finished his doxycycline and Amoxil last week. On Friday he noticed a small erythematous circle about the size of a quarter on the left lower leg just above his ankle. This rapidly expanded and he now has erythema on the lateral and posterior part of the thigh. This is bright red. Also has an area on the dorsal foot just above his toes and a tender area just below the left popliteal fossa. He came off his prophylactic penicillin injections at his own insistence one or 2 months ago. This is obviously deteriorated since then 03/02/18; patient is on doxycycline and Amoxil. Culture I did last week of the weeping area on the back of his left calf grew group B strep. I have therefore renewed the amoxicillin 500 3 times a day for a further week. He has not been systemically unwell. Still complaining of an area of discomfort right under his left popliteal fossa. There is no open wound on the right leg. He tells me that he is using his pumps twice a day on most days 03/09/18; patient arrives in clinic  today completing his amoxicillin today. The cellulitis on his left leg is better. Furthermore he tells me that  he had intramuscular penicillin shots that his primary care office today. However he also states that the wrap on his right leg fell down shortly after leaving clinic last week. He developed a large blister that was present when he came in for a nurse visit later in the week and then he developed intense discomfort around this area.He tells me he is using his compression pumps 03/16/18; the patient has completed his doxycycline. The infectious part of this/cellulitis in the left heel area left popliteal area is a lot better. He has 2 open areas on the right calf. Still areas on the left calf but this is a lot better as well. 03/24/18; the patient arrives complaining of pain in the left popliteal area again. He thinks some of this is wrap injury. He has no open area on the right leg and really no open area on the left calf either except for the popliteal area. He claims to be compliant with the compression pumps 03/31/18; I gave him doxycycline last week because of cellulitis in the left popliteal area. This is a lot better although the surface epithelium is denuded off and response to this. He arrives today with uncontrolled edema in the right calf area as well as a fingernail injury in the right lateral calf. There is only a few open areas on the left 04/06/18; I gave him amoxicillin doxycycline over the last 2 weeks that the amoxicillin should be completing currently. He is not complaining of any pain or systemic symptoms. The only open areas see has is on the right lateral lower leg paradoxically I cannot see anything on the left lower leg. He tells me he is using his compression pumps twice a day on most days. Silver alginate to the wounds that are open under 4 layer compression 04/13/18; he completed antibiotics and has no new complaints. Using his compression pumps. Silver alginate  that anything that's opened 04/20/18; he is using his compression pumps religiously. Silver alginate 4 layer compression anything that's opened. He comes in today with no open wounds on the left leg but 3 on the right including a new one posteriorly. He has 2 on the right lateral and one on the right posterior. He likes Unna boots on the right leg for reasons that aren't really clear we had the usual 4 layer compression on the left. It may be necessary to move to the 4 layer compression on the right however for now I left them in the Unna boots 04/27/18; he is using his compression pumps at least once a day. He has still the wounds on the right lateral calf. The area right posteriorly has closed. He does not have an open wound on the left under 4 layer compression however on the dorsal left foot just proximal to the toes and the left third toe 2 small open areas were identified 05/11/18; he has not uses compression pumps. The areas on the right lateral calf have coalesced into one large wound necrotic surface. On the left side he has one small wound anteriorly however the edema is now weeping out of a large part of his left leg. He says he wasn't using his pumps because of the weeping fluid. I explained to him that this is the time he needs to pump more 05/18/18; patient states he is using his compression pumps twice a day. The area on the right lateral large wound albeit superficial. On the left side he has innumerable number of small new wounds on  the left calf particularly laterally but several anteriorly and medially. All these appear to have healthy granulated base these look like the remnants of blisters however they occurred under compression. The patient arrives in clinic today with his legs somewhat better. There is certainly less edema, less multiple open areas on the left calf and the right anterior leg looks somewhat better as well superficial and a little smaller. However he relates pain  and erythema over the last 3-4 days in the thigh and I looked at this today. He has not been systemically unwell no fever no chills no change in blood sugar values 05/25/18; comes in today in a better state. The severe cellulitis on his left leg seems better with the Keflex. Not as tender. He has not been systemically unwell Hard to find an open wound on the left lower leg using his compression pumps twice a day The confluent wounds on his right lateral calf somewhat better looking. These will ultimately need debridement I didn't do this today. 06/01/18; the severe cellulitis on the left anterior thigh has resolved and he is completed his Keflex. There is no open wound on the left leg however there is a superficial excoriation at the base of the third toe dorsally. Skin on the bottom of his left foot is macerated looking. The left the wounds on the lateral right leg actually looks some better although he did require debridement of the top half of this wound area with an open curet 06/09/18 on evaluation today patient appears to be doing poorly in regard to his right lower extremity in particular this appears to likely be infected he has very thick purulent discharge along with a bright green tent to the discharge. This makes me concerned about the possibility of pseudomonas. He's also having increased discomfort at this point on evaluation. Fortunately there does not appear to be any evidence of infection spreading to the other location at this time. 06/16/18 on evaluation today patient appears to actually be doing fairly well. His ulcer has actually diminished in size quite significantly at this point which is good news. Nonetheless he still does have some evidence of infection he did see infectious disease this morning before coming here for his appointment. I did review the results of their evaluation and their note today. They did actually have him discontinue the Cipro and initiate treatment with  linezolid at this time. He is doing this for the next seven days and they recommended a follow-up in four months with them. He is the keep a log of the need for intermittent antibiotic therapy between now and when he falls back up with infectious disease. This will help them gaze what exactly they need to do to try and help them out. 06/23/18; the patient arrives today with no open wounds on the left leg and left third toe healed. He is been using his compression pumps twice a day. On the right lateral leg he still has a sizable wound but this is a lot better than last time I saw this. 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 Has been using silver alginate under 4 layer compression 06/30/18; the only open wound he has is on the right lateral leg and this looks healthy. No debridement is required. We have been using silver alginate. He does not have an open wound on the left leg. There is apparently some drainage  from the dorsal proximal third toe on the left although I see no open wound here. 07/03/18 on evaluation today patient was actually here just for a nurse visit rapid change. However when he was here on Wednesday for his rat change due to having been healed on the left and then developing blisters we initiated the wrap again knowing that he would be back today for [place] to reevaluate and see were at. Unfortunately he has developed some cellulitis into the proximal portion of his right lower extremity even into the region of his thigh. 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 He was placed on one as what at that point. Nonetheless he is done with that and has been tolerating it well otherwise. Doxycycline which in the past really did not seem to be effective for him. Nonetheless I think the best option may be for [place] to definitely reinitiate the  antibiotics for a longer period of time. 07/07/18; since I last saw this patient a week ago he has had a difficult time. At that point he did not have an open wound on his left leg. We transitioned him into juxta light stockings. He was apparently in the clinic the next day with blisters on the left lateral and left medial lower calf. He also had weeping edema fluid. He was put back into a compression wrap. He was also in the clinic on Friday with intense erythema in his right thigh. Per the patient he was started on Bactrim however that didn't work at all in terms of relieving his pain and swelling. He has taken 3 doxycycline that he had left over from last time and that seems to of helped. He has blistering on the right thigh as well. 07/14/18; the erythema on his right thigh has gotten better with doxycycline that he is finishing. 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 Presumably this cellulitis in the thigh was not related to that although I think this is a bit concerning going forward. He still has an area on the right lateral calf the blister on the right medial calf just below the knee that was discussed above. On the left 2 small open areas left medial and left lateral. Edema control is adequate. He is using his compression pumps twice a day 07/20/18; continued improvement in the condition of both legs especially the edema in his bilateral thighs. He tells me he is been losing weight through a combination of diet and exercise. He is using his compression pumps twice a day. So overall she made to the remaining wounds 07/27/2018; continued improvement in condition of both legs. His edema is well controlled. The area on the right lateral leg is just about closed he had one blisters show up on the medial left upper calf. We have him in 4 layer compression. He is going on a 10-day trip to [place], [place] and  [place]. He will be driving. He wants to wear Unna boots because of the lessening amount of constriction. He will not use compression pumps while he is away 08/05/18 on evaluation today patient actually appears to be doing decently well all things considered in regard to his bilateral lower extremities. The worst ulcer is actually only posterior aspect of his left lower extremity with a four layer compression wrap cut into his leg a couple weeks back. He did have  a trip and actually had [employer] wraps for the trip that he is worn since he was last here. Nonetheless he feels like the [employer] wraps actually do better for him his swelling is up a little bit but he also with his trip was not taking his Lasix on a regular set schedule like he was supposed to be. He states that obviously the reason being that he cannot drive and keep going without having to urinate too frequently which makes it difficult. He did not have his pumps with him while he was away either which I think also maybe playing a role here too. 08/13/2018; the patient only has a small open wound on the right lateral calf which is a big improvement in the last month or 2. He also has the area posteriorly just below the posterior fossa on the left which I think was a wrap injury from several weeks ago. He has no current evidence of cellulitis. He tells me he is back into his compression pumps twice a day. He also tells me that while he was at the laundromat somebody stole a section of his extremitease stockings 08/20/2018; back in the clinic with a much improved state. He only has small areas on the right lateral mid calf which is just about healed. This was is more substantial area for quite a prolonged period of time. He has a small open area on the left anterior tibia. The area on the posterior calf just below the popliteal fossa is closed today. He is using his compression pumps twice a day 08/28/2018; patient has no open wound  on the right leg. He has a smattering of open areas on the calf with some weeping lymphedema. More problematically than that it looks as though his wraps of slipped down in his usual he has very angry upper area of edema just below the right medial knee and on the right lateral calf. He has no open area on his feet. [place] I will send him in an antibiotic. We will continue to wrap the right leg. We ordered extremitease stockings for him last week and I plan to transition the right leg to a stocking when he gets home which will be in 10 days time. As usual he is very reluctant to take his pumps with him when he travels 09/07/2018; [place] He shows me a picture of his left leg in the mid part of his trip last week with intense fire engine erythema. The picture look bad enough I would have considered sending him to the hospital. Instead he went [place] They did not prescribe him antibiotics but he did take some doxycycline he had leftover from a previous visit. I had given him trimethoprim sulfamethoxazole before he left this did not work according to the patient. This is resulted in some improvement fortunately. He comes back with a large wound on the left posterior calf. Smaller area on the left anterior tibia. Denuded blisters on the dorsal left foot over his toes. Does not have much in the way of wounds on the right leg although he does have a very tender area on the right posterior area just below the popliteal fossa also suggestive of infection. He promises me he is back on his pumps twice a day 09/15/2018; the intense cellulitis in his left lower calf is a lot better. The wound area on  the posterior left calf is also so better. However he has reasonably extensive wounds on the dorsal aspect of his second and third toes and the proximal foot just at the base of the toes. There is  nothing open on the right leg 09/22/2018; the patient has excellent edema control in his legs bilaterally. He is using his external compression pumps twice a day. He has no open area on the right leg and only the areas in the left foot dorsally second and third toe area on the left side. He does not have any signs of active cellulitis. 10/06/2018; the patient has good edema control bilaterally. He has no open wound on the right leg. There is a blister in the posterior aspect of his left calf that we had to deal with today. He is using his compression pumps twice a day. There is no signs of active cellulitis. We have been using silver alginate to the wound areas. He still has vulnerable areas on the base of his left first second toes dorsally He has a his extremities stockings and we are going to transition him today into the stocking on the right leg. He is cautioned that he will need to continue to use the compression pumps twice a day. If he notices uncontrolled edema in the right leg he may need to go to 3 times a day. 10/13/2018; the patient came in for a nurse check on Friday he has a large flaccid blister on the right medial calf just below the knee. We unroofed this. He has this and a new area underneath the posterior mid calf which was undoubtedly a blister as well. He also has several small areas on the right which is the area we put his extremities stocking on. 10/19/2018; the patient went to see infectious disease this morning I am not sure if that was a routine follow-up in any case the doxycycline I had given him was discontinued and started on linezolid. He has not started this. It is easy to look at his left calf and the inflammation and think this is cellulitis however he is very tender in the tissue just below the popliteal fossa and I have no doubt that there is infection going on here. He states the problem he is having is that with the compression pumps the edema goes down and  then starts walking the wrap falls down. We will see if we can adhere this. He has 1 or 2 minuscule open areas on the right still areas that are weeping on the posterior left calf, the base of his left second and third toes 10/26/18; back today in clinic with quite of skin breakdown in his left anterior leg. This may have been infection the area below the popliteal fossa seems a lot better however tremendous epithelial loss on the left anterior mid tibia area over quite inexpensive tissue. He has 2 blisters on the right side but no other open wound here. 10/29/2018; came in urgently to see [place] today and we worked him in for review. He states that the 4 layer compression on the right leg caused pain he had to cut it down to roughly his mid calf this caused swelling above the wrap and he has blisters and skin breakdown today. As a result of the pain he has not been using his pumps. Both legs are a lot more edematous and there is a lot of weeping fluid. 11/02/18; arrives in clinic with continued difficulties in the right leg> left.  Leg is swollen and painful. multiple skin blisters and new open areas especially laterally. He has not been using his pumps on the right leg. 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 He is not systemically unwell. 11/09/2018; the patient claims he is being compliant with his pumps. He is finished the doxycycline I gave him last week. Culture I did of the wound on the right lateral leg showed a few very resistant methicillin staph aureus. This was resistant to doxycycline. Nevertheless he states the pain in the leg is a lot better which makes me wonder if the cultured organism was not really what was causing the problem nevertheless this is a very dangerous organism to be culturing out of any wound. His right leg is still a lot larger than the left. He is using an Radio broadcast assistant on this area, he blames a 4-layer compression for  causing the original skin breakdown which I doubt is true however I cannot talk him out of it. We have been using silver alginate to all of these areas which were initially blisters 11/16/2018; patient is being compliant with his external compression pumps at twice a day. Miraculously he arrives in clinic today with absolutely no open wounds. He has better edema control on the left where he has been using 4 layer compression versus wound of wounds on the right and I pointed this out to him. There is no inflammation in the skin in his lower legs which is also somewhat unusual for him. There is no open wounds on the dorsal left foot. He has extremitease stockings at home and I have asked him to bring these in next week. 11/25/18 patient's lower extremity on examination today on the left appears for the most part to be wound free. He does have an open wound on the lateral aspect of the right lower extremity but this is minimal compared to what I've seen in past. He does request that we go ahead and wrap the left leg as well even though there's nothing open just so hopefully it will not reopen in short order. 1/28; patient has superficial open wounds on the right lateral calf left anterior calf and left posterior calf. His edema control is adequate. 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 We have been using silver alginate as the primary dressing. He claims compliance with his compression pumps 2/4; patient has superficial open wounds on numerous areas of his left calf and again one on the left dorsal foot. The areas on the right lateral calf have healed. The cellulitis that I gave him doxycycline for last week is also resolved this was mostly on the left anterior calf just below the tibial tuberosity. His edema looks fairly well-controlled. He tells me he went to see his primary doctor today and had blood work  ordered 2/11; once again he has several open areas on the left calf left tibial area. Most of these are small and appear to have healthy granulation. He does not have anything open on the right. The edema and control in his thighs is pretty good which is usually a good indication he has been using his pumps as requested. 2/18; he continues to have several small areas on the left calf and left tibial area. Most of these are small healthy granulation. We put him in his stocking on the right leg last week and  he arrives with a superficial open area over the right upper tibia and a fairly large area on the right lateral tibia in similar condition. His edema control actually does not look too bad, he claims to be using his compression pumps twice a day 2/25. Continued small areas on the left calf and left tibial area. New areas especially on the right are identified just below the tibial tuberosity and on the right upper tibia itself. There are also areas of weeping edema fluid even without an obvious wound. He does not have a considerable degree of lymphedema but clearly there is more edema here than his skin can handle. He states he is using the pumps twice a day. We have an Unna boot on the right and 4 layer compression on the left. 3/3; he continues to have an area on the right lateral calf and right posterior calf just below the popliteal fossa. There is a fair amount of tenderness around the wound on the popliteal fossa but I did not see any evidence of cellulitis, could just be that the wrap came down and rubbed in this area. He does not have an open area on the left leg however there is an area on the left dorsal foot at the base of the third toe We have been using silver alginate to all wound areas 3/10; he did not have an open area on his left leg last time he was here a week ago. Today he arrives with a horizontal wound just below the tibial tuberosity and an area on the left lateral calf.  He has intense erythema and tenderness in this area. The area is on the right lateral calf and right posterior calf better than last week. We have been using silver alginate as usual 3/18 - Patient returns with 3 small open areas on left calf, and 1 small open area on right calf, the skin looks ok with no significant erythema, he continues the UNA boot on right and 4 layer compression on left. The right lateral calf wound is closed , the right posterior is small area. we will continue silver alginate to the areas. 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 He is been tolerating the dressing changes without complication. Fortunately she has made excellent improvement in regard to the overall status of his wounds. Unfortunately every time we cease wrapping him he ends up reopening in causing more significant issues at that point. Again I'm unsure of the best direction to take although I think the lymphedema clinic may be appropriate for him. 02/03/19 on evaluation today patient appears to be doing well in regard to the wounds that we saw him for last week unfortunately he has a new area on the proximal portion of his right medial/posterior lower extremity where the wrap somewhat slowed down and caused swelling and a blister to rub and open. Unfortunately this is the only opening that he has on either leg at this point. 02/17/19 on evaluation today patient's bilateral lower extremities appear to be doing well. He still completely healed in regard to the left lower extremity. In regard to the right lower extremity the area where the wrap and slid down and caused the blister still seems to be slightly open although this is dramatically better than during the last evaluation two weeks  ago. I'm very pleased with  the way this stands overall. 03/03/19 on evaluation today patient appears to be doing well in regard to his right lower extremity in general although he did have a new blister open this does not appear to be showing any evidence of active infection at this time. Fortunately there's No fevers, chills, nausea, or vomiting noted at this time. Overall I feel like he is making good progress it does feel like that the right leg will we perform the [employer] seems to do with a bit better than three layer wrap on the left which slid down on him. We may switch to doing bilateral in the book wraps. 5/4; I have not seen Mr. Johnson in quite some time. According to our case manager he did not have an open wound on his left leg last week. He had 1 remaining wound on the right posterior medial calf. He arrives today with multiple openings on the left leg probably were blisters and/or wrap injuries from Unna boots. I do not think the Unna boot's will provide adequate compression on the left. I am also not clear about the frequency he is using the compression pumps. 03/17/19 on evaluation today patient appears to be doing excellent in regard to his lower extremities compared to last week's evaluation apparently. He had gotten significantly worse last week which is unfortunate. The [employer] wrap on the left did not seem to do very well for him at all and in fact it didn't control his swelling significantly enough he had an additional outbreak. Subsequently we go back to the four layer compression wrap on the left. This is good news. At least in that he is doing better and the wound seem to be killing him. He still has not heard anything from the lymphedema clinic. 03/24/19 on evaluation today patient actually appears to be doing much better in regard to his bilateral lower Trinity as compared to last week when I saw him. Fortunately there's no signs of active infection at this time. He has been tolerating the  dressing changes without complication. Overall I'm extremely pleased with the progress and appearance in general. 04/07/19 on evaluation today patient appears to be doing well in regard to his bilateral lower extremities. His swelling is significantly down from where it was previous. With that being said he does have a couple blisters still open at this point but fortunately nothing that seems to be too severe and again the majority of the larger openings has healed at this time. 04/14/19 on evaluation today patient actually appears to be doing quite well in regard to his bilateral lower extremities in fact I'm not even sure there's anything significantly open at this time at any site. Nonetheless he did have some trouble with these wraps where they are somewhat irritating him secondary to the fact that he has noted that the graph wasn't too close down to the end of this foot in a little bit short as well up to his knee. Otherwise things seem to be doing quite well. 04/21/19 upon evaluation today patient's wound bed actually showed evidence of being completely healed in regard to both lower extremities which is excellent news. There does not appear to be any signs of active infection which is also good news. I'm very pleased in this regard. No fevers, chills, nausea, or vomiting noted at this time. 04/28/19 on evaluation today patient appears to be doing a little bit worse in regard to both lower extremities  on the left mainly due to the fact that when he went infection disease the wrap was not wrapped quite high enough he developed a blister above this. On the right he is a small open area of nothing too significant but again this is continuing to give him some trouble he has been were in the Velcro compression that he has at home. 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 He's been tolerating the dressing changes without complication.  Fortunately there's no signs of active infection at this time. No fevers, chills, nausea, or vomiting noted at this time. We have been trying to get an appointment with her lymphedema clinic in [facility] but unfortunately nobody can get them on phone with not been able to even fax information over the patient likewise is not been able to get in touch with them. Overall I'm not sure exactly what's going on here with to reach out again today. 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 Still having a lot of drainage unfortunately. He tells me especially in the left but even on the right. There's no signs of active infection which is good news we've been using so ratcheted up to this point. 05/19/19 on evaluation today patient actually appears to be doing quite well with regard to his left lower extremity which is great news. Fortunately in regard to the right lower extremity has an issues with his wrap and he subsequently did remove this from what I'm understanding. Nonetheless long story short is what he had rewrapped once he removed it subsequently had maggots underneath this wrap whenever he came in for evaluation today. With that being said they were obviously completely cleaned away by the nursing staff. The visit today which is excellent news. However he does appear to potentially have some infection around the right ankle region where the maggots were located as well. He will likely require anabiotic therapy today. 05/26/19 on evaluation today patient actually appears to be doing much better in regard to his bilateral lower extremities. I feel like the infection is under much better control. With that being said there were maggots noted when the wrap was removed yet again today. Again this could have potentially been left over from previous although at this time there does not appear to be any  signs of significant drainage there was obviously on the wrap some drainage as well this contracted gnats or otherwise. Either way I do not see anything that appears to be doing worse in my pinion and in fact I think his drainage has slowed down quite significantly likely mainly due to the fact to his infection being under better control. 06/02/2019 on evaluation today patient actually appears to be doing well with regard to his bilateral lower extremities there is no signs of active infection at this time which is great news. With that being said he does have several open areas more so on the right than the left but nonetheless these are all significantly better than previously noted. 06/09/2019 on evaluation today patient actually appears to be doing well. His wrap stayed up and he did not cause any problems he had more drainage on the right compared to the left but overall I do not see any major issues at this time which is great news. 06/16/2019 on evaluation today patient appears to be doing excellent with regard  to his lower extremities the only area that is open is a new blister that can have opened as of today on the medial ankle on the left. Other than this he really seems to be doing great I see no major issues at this point. 06/23/2019 on evaluation today patient appears to be doing quite well with regard to his bilateral lower extremities. In fact he actually appears to be almost completely healed there is a small area of weeping noted of the right lower extremity just above the ankle. Nonetheless fortunately there is no signs of active infection at this time which is good news. No fevers, chills, nausea, vomiting, or diarrhea. 8/24; the patient arrived for a nurse visit today but complained of very significant pain in the left leg and therefore I was asked to look at this. Noted that he did not have an open area on the left leg last week nevertheless this was wrapped. The patient states  that he is not been able to put his compression pumps on the left leg because of the discomfort. He has not been systemically unwell 06/30/2019 on evaluation today patient unfortunately despite being excellent last week is doing much worse with regard to his left lower extremity today. In fact he had to come in for a nurse on Monday where his left leg had to be rewrapped due to excessive weeping Dr. Leanord Hawking placed him on doxycycline at that point. Fortunately there is no signs of active infection Systemically at this time which is good news. 07/07/2019 in regard to the patient's wounds today he actually seems to be doing well with his right lower extremity there really is nothing open or draining at this point this is great news. Unfortunately the left lower extremity is given him additional trouble at this time. There does not appear to be any signs of active infection nonetheless he does have a lot of edema and swelling noted at this point as well as blistering all of which has led to a much more poor appearing leg at this time compared to where it was 2 weeks ago when it was almost completely healed. Obviously this is a little discouraging for the patient. He is try to contact the lymphedema clinic in [place] he has not been able to get through to them. 07/14/2019 on evaluation today patient actually appears to be doing slightly better with regard to his left lower extremity ulcers. Overall I do feel like at least at the top of the wrap that we have been placing this area has healed quite nicely and looks much better. The remainder of the leg is showing signs of improvement. Unfortunately in the thigh area he still has an open region on the left and again on the right he has been utilizing just a Band-Aid on an area that also opened on the thigh. Again this is an area that were not able to wrap although we did do an Ace wrap to provide some compression that something that obviously is a little  less effective than the compression wraps we have been using on the lower portion of the leg. He does have an appointment with the lymphedema clinic in [facility] on Friday. 07/21/2019 on evaluation today patient appears to be doing better with regard to his lower extremity ulcers. He has been tolerating the dressing changes without complication. Fortunately there is no signs of active infection at this time. No fevers, chills, nausea, vomiting, or diarrhea. I did receive the paperwork from the  physical therapist [place]. Subsequently I signed off on that this morning and sent that back to him for further progression with the treatment plan. 07/28/2019 on evaluation today patient appears to be doing very well with regard to his right lower extremity where I do not see any open wounds at this point. Fortunately he is feeling great as far as that is concerned as well. In regard to the left lower extremity he has been having issues with still several areas of weeping and edema although the upper leg is doing better his lower leg still I think is going require the compression wrap at this time. No fevers, chills, nausea, vomiting, or diarrhea. 08/04/2019 on evaluation today patient unfortunately is having new wounds on the right lower extremity. Again we have been using Unna boot wrap on that side. We switched him to using his juxta lite wrap at home. With that being said he tells me he has been using it although his legs extremely swollen and to be honest really does not appear that he has been. I cannot know that for sure however. Nonetheless he has multiple new wounds on the right lower extremity at this time. Obviously we will have to see about getting this rewrapped for him today. 08/11/2019 on evaluation today patient appears to be doing fairly well with regard to his wounds. He has been tolerating the dressing changes including the compression wraps without  complication. He still has a lot of edema in his upper thigh regions bilaterally he is supposed to be seeing the lymphedema clinic on the 15th of this month once his wraps arrive for the upper part of his legs. 08/18/2019 on evaluation today patient appears to be doing well with regard to his bilateral lower extremities at this point. He has been tolerating the dressing changes without complication. Fortunately there is no signs of active infection which is also good news. He does have a couple weeping areas on the first and second toe of the right foot he also has just a small area on the left foot upper leg and a small area on the left lower leg but overall he is doing quite well in my opinion. He is supposed to be getting his wraps shortly in fact tomorrow and then subsequently is seeing the lymphedema clinic next Wednesday on the 21st. Of note he is also leaving on the 25th to go on vacation for a week to the beach. For that reason and since there is some uncertainty about what there can be doing at lymphedema clinic next Wednesday I am get a make an appointment for next Friday here for us to see what we need to do for him prior to him leaving for vacation. 10/23; patient arrives in considerable pain predominantly in the upper posterior calf just distal to the popliteal fossa also in the wound anteriorly above the major wound. This is probably cellulitis and he has had this recurrently in the past. He has no open wound on the right side and he has had an Radio broadcast assistantUnna boot in that area. Finally I note that he has an area on the left posterior calf which by enlarge is mostly epithelialized. This protrudes beyond the borders of the surrounding skin in the setting of dry scaly skin and lymphedema. [place] Per his longstanding pattern, he will not take his compression pumps with him predominantly out of fear that they will be stolen. He  therefore asked that we put  a Unna boot back on the right leg. He will also contact the wound care center in [facility] to see if they can change his dressing in the mid week. 11/3; patient returned from his vacation to [facility]. He was seen on 1 occasion at their wound care center. They did a 2 layer compression system as they did not have our 4-layer wrap. I am not completely certain what they put on the wounds. They did not change the Unna boot on the right. The patient is also seeing a lymphedema specialist physical therapist in [place]. It appears that he has some compression sleeve for his thighs which indeed look quite a bit better than I am used to seeing. He pumps over these with his external compression pumps. 11/10; the patient has a new wound on the right medial thigh otherwise there is no open areas on the right. He has an area on the left leg posteriorly anteriorly and medially and an area over the left second toe. We have been using silver alginate. He thinks the injury on his thigh is secondary to friction from the compression sleeve he has. 11/17; the patient has a new wound on the right medial thigh last week. He thinks this is because he did not have a underlying stocking for his thigh juxta lite apparatus. He now has this. The area is fairly large and somewhat angry but I do not think he has underlying cellulitis. He has a intact blister on the right anterior tibial area. Small wound on the right great toe dorsally Small area on the medial left calf. 11/30; the patient does not have any open areas on his right leg and we did not take his juxta lite stocking off. However he states that on Friday his compression wrap fell down lodging around his upper mid calf area. As usual this creates a lot of problems for him. He called urgently today to be seen for a nurse visit however the nurse visit turned into a provider visit because of extreme erythema and pain in the left anterior tibia extending  laterally and posteriorly. The area that is problematic is extensive 10/06/2019 upon evaluation today patient actually appears to be doing poorly in regard to his left lower extremity. He Dr. Leanord Hawking did place him on doxycycline this past Monday apparently due to the fact that he was doing much worse in regard to this left leg. Fortunately the doxycycline does seem to be helping. Unfortunately we are still having a very difficult time getting his edema under any type of control in order to anticipate discharge at some point. The only way were really able to control his lymphedema really is with compression wraps and that has only even seemingly temporary. He has been seeing a lymphedema clinic they are trying to help in this regard but still this has been somewhat frustrating in general for the patient. Electronic Signature(s) Signed: 10/07/2019 10:36:32 AM By: Lenda Kelp PA-C Entered By: Lenda Kelp on 10/06/2019 13:18:24 -------------------------------------------------------------------------------- Physical Exam Details Patient Name: Date of Service: [name], [name] 10/06/2019 11:00 AM Medical Record [id number] Patient Account Number: [id number] Date of Birth/Sex: Treating RN: 02/20/1951 (68 y.o. M) Primary Care Provider: Nicoletta Ba Other Clinician: Referring Provider: Treating Provider/Extender:[name], [name], [name] in Treatment: 193 Constitutional Well-nourished and well-hydrated in no acute distress. Respiratory normal breathing without difficulty. clear to auscultation bilaterally. Cardiovascular regular rate and rhythm with normal S1, S2. Psychiatric this  patient is able to make decisions and demonstrates good insight into disease process. Alert and Oriented x 3. pleasant and cooperative. Notes Patient's wound bed currently is fairly significant on the left lower extremity at this time. Fortunately there is no signs of active infection  systemically although locally he does have evidence of infection at this time. Overall the doxycycline does seem to be helping the markings that Dr. Leanord Hawking placed do seem to be receding from the original erythema line down to where it is currently. Still I think we will get a have to continue to wrap his bilateral lower extremities although he is using his compression wraps for the upper extremity with that being said he tells me is not been using this on the left as he was afraid it would make his lower leg swell too much. Electronic Signature(s) Signed: 10/07/2019 10:36:32 AM By: Lenda Kelp PA-C Entered By: Lenda Kelp on 10/06/2019 13:20:33 -------------------------------------------------------------------------------- Physician Orders Details Patient Name: Date of Service: [name], [name] 10/06/2019 11:00 AM Medical Record [id number] Patient Account Number: [id number] Date of Birth/Sex: Treating RN: 05-23-1951 (68 y.o. 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 Nurse Visit: - Friday for rewrap of left leg Dressing Change Frequency Do not change entire dressing for one week. - both legs Skin Barriers/Peri-Wound Care TCA Cream or Ointment - mix with  lotion both legs Wound Cleansing May shower with protection. Primary Wound Dressing Wound #145 Right,Medial Upper Leg Calcium Alginate with Silver Wound #146 Left Toe Second Calcium Alginate with Silver Wound #147 Left,Circumferential Lower Leg Calcium Alginate with Silver Wound #148 Right Toe Second Calcium Alginate with Silver Wound #149 Left,Medial Upper Leg Calcium Alginate with Silver Secondary Dressing Wound #145 Right,Medial Upper Leg Foam Border Wound #146 Left Toe Second Kerlix/Rolled Gauze - secure with tape Dry Gauze Wound #148 Right Toe Second Kerlix/Rolled Gauze - secure with tape Dry Gauze Wound #147 Left,Circumferential Lower Leg ABD pad Kerramax - Zetuvit Wound #149 Left,Medial Upper Leg ABD pad - secured with tape Edema Control 4 layer compression: Left lower extremity - unna boot at upper portion of lower leg. Unna Boot to Right Lower Extremity Avoid standing for long periods of time Elevate legs to the level of the heart or above for 30 minutes daily and/or when sitting, a frequency of: Exercise regularly Segmental Compressive Device. - lymphadema pumps 60 min 2 times per day Electronic Signature(s) Signed: 10/06/2019 6:22:02 PM By: Zenaida Deed RN, BSN Signed: 10/07/2019 10:36:32 AM By: Lenda Kelp PA-C Entered By: Zenaida Deed on 10/06/2019 12:33:00 -------------------------------------------------------------------------------- Problem List Details Patient Name: Date of Service: [name], [name] 10/06/2019 11:00 AM Medical Record [id number] Patient Account Number: [id number] Date of Birth/Sex: Treating RN: Jul 24, 1951 (68 y.o. M) Primary Care Provider: Nicoletta Ba Other Clinician: Referring Provider: Treating Provider/Extender:[name], [name], [name] in Treatment: 193 Active Problems ICD-10 Evaluated Encounter Code Description Active Date Today Diagnosis L97.211 Non-pressure chronic  ulcer of right calf limited  to 06/30/2018 No Yes breakdown of skin L97.221 Non-pressure chronic ulcer of left calf limited to 09/30/2016 No Yes breakdown of skin I87.333 Chronic venous hypertension (idiopathic) with ulcer 01/22/2016 No Yes and inflammation of bilateral lower extremity I89.0 Lymphedema, not elsewhere classified 01/22/2016 No Yes E11.622 Type 2 diabetes mellitus with other skin ulcer 01/22/2016 No Yes 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, 01/22/2016 No Yes unspecified L03.116 Cellulitis of left lower limb 04/01/2017 No Yes Inactive Problems ICD-10 Code Description Active Date Inactive Date 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 06/30/2017 skin L97.521 Non-pressure chronic ulcer of other part of left foot limited to 04/27/2018 04/27/2018 breakdown of skin L03.115 Cellulitis of right lower limb 12/22/2017 12/22/2017 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 with other specified 06/30/2018 06/30/2018 severity L97.511 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 06/30/2018 06/30/2018 breakdown of skin Resolved Problems Electronic Signature(s) Signed: 10/07/2019 10:36:32 AM By: Lenda Kelp PA-C Entered By: Lenda Kelp on 10/06/2019 11:23:37 -------------------------------------------------------------------------------- Progress Note/History and Physical Details Patient Name: Date of Service: [name], [name] 10/06/2019 11:00 AM Medical Record [id number] Patient Account Number: [id number] Date of Birth/Sex: Treating RN: 1951-01-20 (68 y.o. M) Primary Care Provider: Nicoletta Ba Other Clinician: Referring Provider: Treating Provider/Extender:[name], [name], [name] in Treatment: 193 Subjective Chief Complaint Information obtained from Patient patient is here for evaluation venous/lymphedema weeping History of Present Illness (HPI) Referred by PCP for consultation. Patient has long standing  history of BLE venous stasis, no prior ulcerations. At beginning of month, developed cellulitis and weeping. Received IM Rocephin followed by Keflex and resolved. Wears compression stocking, appr 6 months old. Not sure strength. No present drainage. 01/22/16 this is a patient who is a type II diabetic on insulin. 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 He tells me he religiously wears pressure stockings of uncertain strength. He was here with weeping edema about 8 months ago but did not have an open wound. Roughly a month ago he had a reopening on his bilateral legs. He is been using bandages and Neosporin. He does not complain of pain. 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 Last BUN/creatinine I have is from 11/20/15 at 13 and 1.0 respectively 01/29/16; patient arrives today having tolerated the Profore wrap. He brought in his stockings and these are 18 mmHg stockings he bought from [employer]. The compression here is likely inadequate. He does not complain of pain or excessive drainage she has no systemic symptoms. The wound on the right looks improved as does the one on the left although one on the left is more substantial with still tissue at risk below the actual wound area on the bilateral posterior calf 02/05/16; patient arrives with poor edema control. He states that we did put a 4 layer compression on it last week. No weight appear 5 this. 02/12/16; the area on the posterior right Has healed. The left Has a substantial wound that has necrotic surface eschar that requires a debridement with a curette. 02/16/16;the patient called or a Nurse visit secondary to increased swelling. He had been in earlier in the week with his right leg healed. He was transitioned to is on pressure stocking on the right leg with the only open wound on the left, a substantial  area on  the left posterior calf. Note he has a history of severe lower extremity edema, he has a history of chronic atrial fibrillation but not heart failure per my notes but I'll need to research this. He is not complaining of chest pain shortness of breath or orthopnea. The intake nurse noted blisters on the previously closed right leg 02/19/16; this is the patient's regular visit day. I see him on Friday with escalating edema new wounds on the right leg and clear signs of at least right ventricular heart failure. I increased his Lasix to 40 twice a day. He is returning currently in follow-up. States he is noticed a decrease in that the edema 02/26/16 patient's legs have much less edema. There is nothing really open on the right leg. The left leg has improved condition of the large superficial wound on the posterior left leg 03/04/16; edema control is very much better. The patient's right leg wounds have healed. On the left leg he continues to have severe venous inflammation on the posterior aspect of the left leg. There is no tenderness and I don't think any of this is cellulitis. 03/11/16; patient's right leg is married healed and he is in his own stocking. The patient's left leg has deteriorated somewhat. There is a lot of erythema around the wound on the posterior left leg. There is also a significant rim of erythema posteriorly just above where the wrap would've ended there is a new wound in this location and a lot of tenderness. Can't rule out cellulitis in this area. 03/15/16; patient's right leg remains healed and he is in his own stocking. The patient's left leg is much better than last review. His major wound on the posterior aspect of his left Is almost fully epithelialized. He has 3 small injuries from the wraps. Really. Erythema seems a lot better on antibiotics 03/18/16; right leg remains healed and he is in his own stocking. The patient's left leg is much better. The area on  the posterior aspect of the left calf is fully epithelialized. His 3 small injuries which were wrap injuries on the left are improved only one seems still open his erythema has resolved 03/25/16; patient's right leg remains healed and he is in his own stocking. There is no open area today on the left leg posterior leg is completely closed up. His wrap injuries at the superior aspect of his leg are also resolved. He looks as though he has some irritation on the dorsal ankle but this is fully epithelialized without evidence of infection. 03/28/16; we discharged this patient on Monday. Transitioned him into his own stocking. There were problems almost immediately with uncontrolled swelling weeping edema multiple some of which have opened. He does not feel systemically unwell in particular no chest pain no shortness of breath and he does not feel 04/08/16; the edema is under better control with the Profore light wrap but he still has pitting edema. There is one large wound anteriorly 2 on the medial aspect of his left leg and 3 small areas on the superior posterior calf. Drainage is not excessive he is tolerating a Profore light well 04/15/16; put a Profore wrap on him last week. This is controlled is edema however he had a lot of pain on his left anterior foot most of his wounds are healed 04/22/16 once again the patient has denuded areas on the left anterior foot which he states are because his wrap slips up word. He saw his primary physician today is on  Lasix 40 twice a day and states that he his weight is down 20 pounds over the last 3 months. 04/29/16: Much improved. left anterior foot much improved. He is now on Lasix 80 mg per day. Much improved edema control 05/06/16; I was hoping to be able to discharge him today however once again he has blisters at a low level of where the compression was placed last week mostly on his left lateral but also his left medial leg and a small area on the anterior part  of the left foot. 05/09/16; apparently the patient went home after his appointment on 7/4 later in the evening developing pain in his upper medial thigh together with subjective fever and chills although his temperature was not taken. The pain was so intense he felt he would probably have to call 911. However he then remembered that he had leftover doxycycline from a previous round of antibiotics and took these. By the next morning he felt a lot better. He called and spoke to one of our nurses and I approved doxycycline over the phone thinking that this was in relation to the wounds we had previously seen although they were definitely were not. The patient feels a lot better old fever no chills he is still working. Blood sugars are reasonably controlled 05/13/16; patient is back in for review of his cellulitis on his anterior medial upper thigh. He is taking doxycycline this is a lot better. Culture I did of the nodular area on the dorsal aspect of his foot grew MRSA this also looks a lot better. 05/20/16; the patient is cellulitis on the medial upper thigh has resolved. All of his wound areas including the left anterior foot, areas on the medial aspect of the left calf and the lateral aspect of the calf at all resolved. He has a new blister on the left dorsal foot at the level of the fourth toe this was excised. No evidence of infection 05/27/16; patient continues to complain weeping edema. He has new blisterlike wounds on the left anterior lateral and posterior lateral calf at the top of his wrap levels. The area on his left anterior foot appears better. He is not complaining of fever, pain or pruritus in his feet. 05/30/16; the patient's blisters on his left anterior leg posterior calf all look improved. He did not increase the Lasix 100 mg as I suggested because he was going to run out of his 40 mg tablets. He is still having weeping edema of his toes 06/03/16; I renewed his Lasix at 80 mg once a  day as he was about to run out when I last saw him. He is on 80 mg of Lasix now. I have asked him to cut down on the excessive amount of water he was drinking and asked him to drink according to his thirst mechanisms 06/12/2016 -- was seen 2 days ago and was supposed to wear his compression stockings at home but he is developed lymphedema and superficial blisters on the left lower extremity and hence came in for a review 06/24/16; the remaining wound is on his left anterior leg. He still has edema coming from between his toes. There is lymphedema here however his edema is generally better than when I last saw this. He has a history of atrial fibrillation but does not have a known history of congestive heart failure nevertheless I think he probably has this at least on a diastolic basis. 07/01/16 I reviewed his echocardiogram from January 2017. This  was essentially normal. He did not have LVH, EF of 55-60%. His right ventricular function was normal although he did have trivial tricuspid and pulmonic regurgitation. This is not audible on exam however. I increased his Lasix to do massive edema in his legs well above his knees I think in early July. He was also drinking an excessive amount of water at the time. 07/15/16; missed his appointment last week because of the Labor Day holiday on Monday. He could not get another appointment later in the week. Started to feel the wrap digging in superiorly so we remove the top half and the bottom half of his wrap. He has extensive erythema and blistering superiorly in the left leg. Very tender. Very swollen. Edema in his foot with leaking edema fluid. He has not been systemically unwell 07/22/16; the area on the left leg laterally required some debridement. The medial wounds look more stable. His wrap injury wounds appear to have healed. Edema and his foot is better, weeping edema is also better. He tells me he is meeting with the supplier of the external  compression pumps at work 08/05/16; the patient was on vacation last week in [facility]. His wrap is been on for an extended period of time. Also over the weekend he developed an extensive area of tender erythema across his anterior medial thigh. He took to doxycycline yesterday that he had leftover from a previous prescription. The patient complains of weeping edema coming out of his toes 08/08/16; I saw this patient on 10/2. He was tender across his anterior thigh. I put him on doxycycline. He returns today in follow-up. He does not have any open wounds on his lower leg, he still has edema weeping into his toes. 08/12/16; patient was seen back urgently today to follow-up for his extensive left thigh cellulitis/erysipelas. He comes back with a lot less swelling and erythema pain is much better. I believe I gave him Augmentin and Cipro. His wrap was cut down as he stated a roll down his legs. He developed blistering above the level of the wrap that remained. He has 2 open blisters and 1 intact. 08/19/16; patient is been doing his primary doctor who is increased his Lasix from 40-80 once a day or 80 already has less edema. Cellulitis has remained improved in the left thigh. 2 open areas on the posterior left calf 08/26/16; he returns today having new open blisters on the anterior part of his left leg. He has his compression pumps but is not yet been shown how to use some vital representative from the supplier. 09/02/16 patient returns today with no open wounds on the left leg. Some maceration in his plantar toes 09/10/2016 -- Dr. Leanord Hawking had recently discharged him on 09/02/2016 and he has come right back with redness swelling and some open ulcers on his left lower extremity. He says this was caused by trying to apply his compression stockings and he's been unable to use this and has not been able to use his lymphedema pumps. He had some doxycycline leftover and he has started on this a few days  ago. 09/16/16; there are no open wounds on his leg on the left and no evidence of cellulitis. 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 He does not complain of symptoms here. I am not clear use using his external compression pumps. 09/23/16; I have not seen this patient in 2 weeks. He canceled his appointment 10 days  ago as he was going on vacation. He tells me that on Monday he noticed a large area on his posterior left leg which is been draining copiously and is reopened into a large wound. He is been using ABDs and the external part of his juxtalite, according to our nurse this was not on properly. 10/07/16; Still a substantial area on the posterior left leg. Using silver alginate 10/14/16; in general better although there is still open area which looks healthy. Still using silver alginate. He reminds me that this happen before he left for [facility]. Today while he was showering in the morning. He had been using his juxtalite's 10/21/16; the area on his posterior left leg is fully epithelialized. However he arrives today with a large area of tender erythema in his medial and posterior left thigh just above the knee. I have marked the area. Once again he is reluctant to consider hospitalization. I treated him with oral antibiotics in the past for a similar situation with resolution I think with doxycycline however this area it seems more extensive to me. He is not complaining of fever but does have chills and says states he is thirsty. His blood sugar today was in the 140s at home 10/25/16 the area on his posterior left leg is fully epithelialized although there is still some weeping edema. The large area of tenderness and erythema in his medial and posterior left thigh is a lot less tender although there is still a lot of swelling in this thigh. He states he feels a lot better. He is on doxycycline and Augmentin that I started  last week. This will continued until Tuesday, December 26. I have ordered a duplex ultrasound of the left thigh rule out DVT whether there is an abscess something that would need to be drained I would also like to know. 11/01/16; he still has weeping edema from a not fully epithelialized area on his left posterior calf. Most of the rest of this looks a lot better. He has completed his antibiotics. His thigh is a lot better. Duplex ultrasound did not show a DVT in the thigh 11/08/16; he comes in today with more Denuded surface epithelium from the posterior aspect of his calf. There is no real evidence of cellulitis. The superior aspect of his wrap appears to have put quite an indentation in his leg just below the knee and this may have contributed. He does not complain of pain or fever. We have been using silver alginate as the primary dressing. The area of cellulitis in the right thigh has totally resolved. He has been using his compression stockings once a week 11/15/16; the patient arrives today with more loss of epithelium from the posterior aspect of his left calf. He now has a fairly substantial wound in this area. The reason behind this deterioration isn't exactly clear although his edema is not well controlled. He states he feels he is generally more swollen systemically. He is not complaining of chest pain shortness of breath fever. Tells me he has an appointment with his primary physician in early February. He is on 80 mg of oral Lasix a day. He claims compliance with the external compression pumps. He is not having any pain in his legs similar to what he has with his recurrent cellulitis 11/22/16; the patient arrives a follow-up of his large area on his left lateral calf. This looks somewhat better today. He came in earlier in the week for a dressing change since I saw  him a week ago. He is not complaining of any pain no shortness of breath no chest pain 11/28/16; the patient arrives for  follow-up of his large area on the left lateral calf this does not look better. In fact it is larger weeping edema. The surface of the wound does not look too bad. We have been using silver alginate although I'm not certain that this is a dressing issue. 12/05/16; again the patient follows up for a large wound on the left lateral and left posterior calf this does not look better. There continues to be weeping edema necrotic surface tissue. More worrisome than this once again there is erythema below the wound involving the distal Achilles and heel suggestive of cellulitis. He is on his feet working most of the day of this is not going well. We are changing his dressing twice a week to facilitate the drainage. 12/12/16; not much change in the overall dimensions of the large area on the left posterior calf. This is very inflamed looking. I gave him an. Doxycycline last week does not really seem to have helped. He found the wrap very painful indeed it seems to of dog into his legs superiorly and perhaps around the heel. He came in early today because the drainage had soaked through his dressings. 12/19/16- patient arrives for follow-up evaluation of his left lower extremity ulcers. He states that he is using his lymphedema pumps once daily when there is "no drainage". He admits to not using his lipedema pumps while under current treatment. His blood sugars have been consistently between 150-200. 12/26/16; the patient is not using his compression pumps at home because of the wetness on his feet. I've advised him that I think it's important for him to use this daily. He finds his feet too wet, he can put a plastic bag over his legs while he is in the pumps. Otherwise I think will be in a vicious circle. We are using silver alginate to the major area on his left posterior calf 01/02/17; the patient's posterior left leg has further of all into 3 open wounds. All of them covered with a necrotic surface. He claims  to be using his compression pumps once a day. His edema control is marginal. Continue with silver alginate 01/10/17; the patient's left posterior leg actually looks somewhat better. There is less edema, less erythema. Still has 3 open areas covered with a necrotic surface requiring debridement. He claims to be using his compression pumps once a day his edema control is better 01/17/17; the patient's left posterior calf look better last week when I saw him and his wrap was changed 2 days ago. He has noted increasing pain in the left heel and arrives today with much larger wounds extensive erythema extending down into the entire heel area especially tender medially. He is not systemically unwell CBGs have been controlled no fever. Our intake nurse showed me limegreen drainage on his AVD pads. 01/24/17; his usual this patient responds nicely to antibiotics last week giving him Levaquin for presumed Pseudomonas. The whole entire posterior part of his leg is much better much less inflamed and in the case of his Achilles heel area much less tender. He has also had some epithelialization posteriorly there are still open areas here and still draining but overall considerably better 01/31/17- He has continue to tolerate the compression wraps. he states that he continues to use the lymphedema pumps daily, and can increase to twice daily on the weekends. He is  voicing no complaints or concerns regarding his LLE ulcers 02/07/17-he is here for follow-up evaluation. He states that he noted some erythema to the left medial and anterior thigh, which he states is new as of yesterday. He is concerned about recurrent cellulitis. He states his blood sugars have been slightly elevated, this morning in the 180s 02/14/17; he is here for follow-up evaluation. When he was last here there was erythema superiorly from his posterior wound in his anterior thigh. He was prescribed Levaquin however a culture of the wound surface grew  MRSA over the phone I changed him to doxycycline on Monday and things seem to be a lot better. 02/24/17; patient missed his appointment on Friday therefore we changed his nurse visit into a physician visit today. Still using silver alginate on the large area of the posterior left thigh. He isn't new area on the dorsal left second toe 03/03/17; actually better today although he admits he has not used his external compression pumps in the last 2 days or so because of work responsibilities over the weekend. 03/10/17; continued improvement. External compression pumps once a day almost all of his wounds have closed on the posterior left calf. Better edema control 03/17/17; in general improved. He still has 3 small open areas on the lateral aspect of his left leg however most of the area on the posterior part of his leg is epithelialized. He has better edema control. He has an ABD pad under his stocking on the right anterior lower leg although he did not let us look at that today. 03/24/17; patient arrives back in clinic today with no open areas however there are areas on the posterior left calf and anterior left calf that are less than 100% epithelialized. His edema is well controlled in the left lower leg. There is some pitting edema probably lymphedema in the left upper thigh. He uses compression pumps at home once per day. I tried to get him to do this twice a day although he is very reticent. 04/01/2017 -- for the last 2 days he's had significant redness, tenderness and weeping and came in for an urgent visit today. 04/07/17; patient still has 6 more days of doxycycline. He was seen by Dr. Meyer Russel last Wednesday for cellulitis involving the posterior aspect, lateral aspect of his Involving his heel. For the most part he is better there is less erythema and less weeping. He has been on his feet for 12 hours o2 over the weekend. Using his compression pumps once a day 04/14/17 arrives today with continued  improvement. Only one area on the posterior left calf that is not fully epithelialized. He has intense bilateral venous inflammation associated with his chronic venous insufficiency disease and secondary lymphedema. We have been using silver alginate to the left posterior calf wound In passing he tells [place] today that the right leg but we have not seen in quite some time has an open area on it but he doesn't want [place] to look at this today states he will show this to [place] next week. 04/21/17; there is no open area on his left leg although he still reports some weeping edema. He showed [place] his right leg today which is the first time we've seen this leg in a long time. He has a large area of open wound on the right leg anteriorly healthy granulation. Quite a bit of swelling in the right leg and some degree of venous inflammation. He told us about the right leg in passing  last week but states that deterioration in the right leg really only happened over the weekend 04/28/17; there is no open area on the left leg although there is an irritated part on the posterior which is like a wrap injury. The wound on the right leg which was new from last week at least to [place] is a lot better. 05/05/17; still no open area on the left leg. Patient is using his new compression stocking which seems to be doing a good job of controlling the edema. He states he is using his compression pumps once per day. The right leg still has an open wound although it is better in terms of surface area. Required debridement. A lot of pain in the posterior right Achilles marked tenderness. Usually this type of presentation this patient gives concern for an active cellulitis 05/12/17; patient arrives today with his major wound from last week on the right lateral leg somewhat better. Still requiring debridement. He was using his compression stocking on the left leg however that is reopened with superficial wounds anteriorly he did not have an open  wound on this leg previously. He is still using his juxta light's once daily at night. He cannot find the time to do this in the morning as he has to be at work by 7 AM 05/19/17; right lateral leg wound looks improved. No debridement required. The concerning area is on the left posterior leg which appears to almost have a subcutaneous hemorrhagic component to it. We've been using silver alginate to all the wounds 05/26/17; the right lateral leg wound continues to look improved. However the area on the left posterior calf is a tightly adherent surface. [place] using silver alginate. Because of the weeping edema in his legs there is very little good alternatives. 06/02/17; the patient left here last week looking quite good. Major wound on the left posterior calf and a small one on the right lateral calf. Both of these look satisfactory. He tells me that by Wednesday he had noted increased pain in the left leg and drainage. He called on Thursday and Friday to get an appointment here but we were blocked. He did not go to urgent care or his primary physician. He thinks he had a fever on Thursday but did not actually take his temperature. He has not been using his compression pumps on the left leg because of pain. I advised him to go to the emergency room today for IV antibiotics for stents of left leg cellulitis but he has refused I have asked him to take 2 days off work to keep his leg elevated and he has refused this as well. In view of this I'm going to call him and Augmentin and doxycycline. 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 However after a detailed discussion regarding the risk benefits and alternatives he refuses to get admitted to the hospital. With  no other recourse I will continue him on oral antibiotics as before and hopefully he'll have his infectious disease consultation this week. 06/16/2017 -- the patient was seen today by the nurse practitioner at infectious disease Ms. Dixon. Her review noted recurrent cellulitis of the lower extremity with tinea pedis of the left foot and she has recommended clindamycin 150 mg  daily for now and she may increase it to 300 mg daily to cover staph and Streptococcus. He has also been advise Lotrimin cream locally. she also had wise IV antibiotics for his condition if it flares up 06/23/17; patient arrives today with drainage bilaterally although the remaining wound on the left posterior calf after cleaning up today "highlighter yellow drainage" did not look too bad. Unfortunately he has had breakdown on the right anterior leg [previously this leg had not been open and he is using a black stocking] he went to see infectious disease and is been put on clindamycin 150 mg daily, I did not verify the dose although I'm not familiar with using clindamycin in this dosing range, perhaps for prophylaxisoo 06/27/17; I brought this patient back today to follow-up on the wound deterioration on the right lower leg together with surrounding cellulitis. I started him on doxycycline 4 days ago. This area looks better however he comes in today with intense cellulitis on the medial part of his left thigh. This is not have a wound in this area. Extremely tender. We've been using silver alginate to the wounds on the right lower leg left lower leg with bilateral 4 layer compression he is using his external compression pumps once a day 07/04/17; patient's left medial thigh cellulitis looks better. He has not been using his compression pumps as his insert said it was contraindicated with cellulitis. His right leg continues to make improvements all the wounds are still open. We only have one remaining wound on the left posterior  calf. Using silver alginate to all open areas. He is on doxycycline which I started a week ago and should be finishing I gave him Augmentin after Thursday's visit for the severe cellulitis on the left medial thigh which fortunately looks better 07/14/17; the patient's left medial thigh cellulitis has resolved. The cellulitis in his right lower calf on the right also looks better. All of his wounds are stable to improved we've been using silver alginate he has completed the antibiotics I have given him. He has clindamycin 150 mg once a day prescribed by infectious disease for prophylaxis, I've advised him to start this now. We have been using bilateral Unna boots over silver alginate to the wound areas 07/21/17; the patient is been to see infectious disease who noted his recurrent problems with cellulitis. He was not able to tolerate prophylactic clindamycin therefore he is on amoxicillin 500 twice a day. He also had a second daily dose of Lasix added By Dr. Oneta RackMcKeown but he is not taking this. Nor is he being completely compliant with his compression pumps a especially not this week. He has 2 remaining wounds one on the right posterior lateral lower leg and one on the left posterior medial lower leg. 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 The patient has Unna boots bilaterally. 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 He has bilateral Unna boots for compression and silver alginate to his wounds. Arrives today with his legs looking as good as I have seen him in quite some time. Not surprisingly his wounds look better as well with improvement on the right lateral leg venous insufficiency wound and also the left medial leg. He is still using the compression pumps once a  day 08/11/17; both  legs appear to be doing better wounds on the right lateral and left medial legs look better. Skin on the right leg quite good. He is been using silver alginate as the primary dressing. I'm going to use Anasept gel calcium alginate and maintain all the secondary dressings 08/18/17; the patient continues to actually do quite well. The area on his right lateral leg is just about closed the left medial also looks better although it is still moist in this area. His edema is well controlled we have been using Anasept gel with calcium alginate and the usual secondary dressings, 4 layer compression and once daily use of his compression pumps "always been able to manage 09/01/17; the patient continues to do reasonably well in spite of his trip to [place]. The area on the right lateral leg is epithelialized. Left is much better but still open. He has more edema and more chronic erythema on the left leg [venous inflammation] 09/08/17; he arrives today with no open wound on the right lateral leg and decently controlled edema. Unfortunately his left leg is not nearly as in his good situation as last week.he apparently had increasing edema starting on Saturday. He edema soaked through into his foot so used a plastic bag to walk around his home. The area on the medial right leg which was his open area is about the same however he has lost surface epithelium on the left lateral which is new and he has significant pain in the Achilles area of the left foot. He is already on amoxicillin chronically for prophylaxis of cellulitis in the left leg 09/15/17; he is completed a week of doxycycline and the cellulitis in the left posterior leg and Achilles area is as usual improved. He still has a lot of edema and fluid soaking through his dressings. There is no open wound on the right leg. He saw infectious disease NP today 09/22/17;As usual 1 we transition him from our compression wraps to his  stockings things did not go well. He has several small open areas on the right leg. He states this was caused by the compression wrap on his skin although he did not wear this with the stockings over them. He has several superficial areas on the left leg medially laterally posteriorly. He does not have any evidence of active cellulitis especially involving the left Achilles The patient is traveling from [facility] Saturday going to [facility]. He states he isn't attempting to get an appointment with a heel objects wound center there to change his dressings. I am not completely certain whether this will work 10/06/17; the patient came in on Friday for a nurse visit and the nurse reported that his legs actually look quite good. He arrives in clinic today for his regular follow-up visit. He has a new wound on his left third toe over the PIP probably caused by friction with his footwear. He has small areas on the left leg and a very superficial but epithelialized area on the right anterior lateral lower leg. Other than that his legs look as good as I've seen him in quite some time. We have been using silver alginate Review of systems; no chest pain no shortness of breath other than this a 10 point review of systems negative 10/20/17; seen by Dr. Meyer Russel last week. He had taken some antibiotics [doxycycline] that he had left over. Dr. Meyer Russel thought he had candida infection and declined to give him further antibiotics. He has a small wound remaining on the right  lateral leg several areas on the left leg including a larger area on the left posterior several left medial and anterior and a small wound on the left lateral. The area on the left dorsal third toe looks a lot better. ROS; Gen.; no fever, respiratory no cough no sputum Cardiac no chest pain other than this 10 point review of system is negative 10/30/17; patient arrives today having fallen in the bathtub 3 days ago. It took him a while to get  up. He has pain and maceration in the wounds on his left leg which have deteriorated. He has not been using his pumps he also has some maceration on the right lateral leg. 11/03/17; patient continues to have weeping edema especially in the left leg. This saturates his dressings which were just put on on 12/27. As usual the doxycycline seems to take care of the cellulitis on his lower leg. He is not complaining of fever, chills, or other systemic symptoms. He states his leg feels a lot better on the doxycycline I gave him empirically. He also apparently gets injections at his primary doctor's officeo Rocephin for cellulitis prophylaxis. I didn't ask him about his compression pump compliance today I think that's probably marginal. Arrives in the clinic with all of his dressings primary and secondary macerated full of fluid and he has bilateral edema 11/10/17; the patient's right leg looks some better although there is still a cluster of wounds on the right lateral. The left leg is inflamed with almost circumferential skin loss medially to laterally although we are still maintaining anteriorly. He does not have overt cellulitis there is a lot of drainage. He is not using compression pumps. We have been using silver alginate to the wound areas, there are not a lot of options here 11/17/17; the patient's right leg continues to be stable although there is still open wounds, better than last week. The inflammation in the left leg is better. Still loss of surface layer epithelium especially posteriorly. There is no overt cellulitis in the amount of edema and his left leg is really quite good, tells me he is using his compression pumps once a day. 11/24/17; patient's right leg has a small superficial wound laterally this continues to improve. The inflammation in the left leg is still improving however we have continuous surface layer epithelial loss posteriorly. There is no overt cellulitis in the amount of  edema in both legs is really quite good. He states he is using his compression pumps on the left leg once a day for 5 out of 7 days 12/01/17; very small superficial areas on the right lateral leg continue to improve. Edema control in both legs is better today. He has continued loss of surface epithelialization and left posterior calf although I think this is better. We have been using silver alginate with large number of absorptive secondary dressings 4 layer on the left Unna boot on the right at his request. He tells me he is using his compression pumps once a day 12/08/17; he has no open area on the right leg is edema control is good here. ooOn the left leg however he has marked erythema and tenderness breakdown of skin. He has what appears to be a wrap injury just distal to the popliteal fossa. 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 There was swelling erythema and breakdown in the right leg. He has much worse skin breakdown in the left leg as well multiple open areas medially and posteriorly as well as laterally. He tells me he has been using his compression pumps but tells me he feels that the drainage out of his leg is worse when he uses a compression pumps. To be fair to him he is been saying this for a while however I don't know that I have really been listening to this. I wonder if the compression pumps are working properly 12/22/17;. Once again he arrives with severe erythema, weeping edema from the left greater than right leg. Noncompliance with compression pumps. New this visit he is complaining of pain on the lateral aspect of the right leg and the medial aspect of his right thigh. He apparently saw his  cardiologist Dr. [name] who was ordered an echocardiogram area and I think this is a step in the right direction 12/25/17; started his doxycycline Monday night. There is still intense erythema of the right leg especially in the anterior thigh although there is less tenderness. The erythema around the wound on the right lateral calf also is less tender. He still complaining of pain in the left heel. His wounds are about the same right lateral left medial left lateral. Superficial but certainly not close to closure. He denies being systemically unwell no fever chills no abdominal pain no diarrhea 12/29/17; back in follow-up of his extensive right calf and right thigh cellulitis. I added amoxicillin to cover possible doxycycline resistant strep. This seems to of done the trick he is in much less pain there is much less erythema and swelling. He has his echocardiogram at 11:00 this morning. X-ray of the left heel was also negative. 01/05/18; the patient arrived with his edema under much better control. Now that he is retired he is able to use his compression pumps daily and sometimes twice a day per the patient. He has a wound on the right leg the lateral wound looks better. Area on the left leg also looks a lot better. He has no evidence of cellulitis in his bilateral thighs I had a quick peak at his echocardiogram. He is in normal ejection fraction and normal left ventricular function. He has moderate pulmonary hypertension moderately reduced right ventricular function. One would have to wonder about chronic sleep apnea although he says he doesn't snore. He'll review the echocardiogram with his cardiologist. 01/12/18; the patient arrives with the edema in both legs under exemplary control. He is using his compression pumps daily and sometimes twice daily. His wound on the right lateral leg is just about closed. He still has some weeping areas on the posterior left calf and lateral left calf although  everything is just about closed here as well. I have spoken with Aldean Baker who is the patient's nurse practitioner and infectious disease. She was concerned that the patient had not understood that the parenteral penicillin injections he was receiving for cellulitis prophylaxis was actually benefiting him. I don't think the patient actually saw that I would tend to agree we were certainly dealing with less infections although he had a serious one last month. 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 He is healed. He'll be placed in juxtalite compression wraps and increase his lymphedema pumps to twice daily. We will follow up again next  week to ensure there are no issues with the new regiment. 01/20/18-he is here for evaluation of bilateral lower extremity weeping edema. Yesterday he was placed in compression wrap to the right lower extremity and compression stocking to left lower shrubbery. He states he uses lymphedema pumps last night and again this morning and noted a blister to the left lower extremity. On exam he was noted to have drainage to the right lower extremity. He will be placed in Unna boots bilaterally and follow-up next week 01/26/18; patient was actually discharged a week ago to his own juxta light stockings only to return the next day with bilateral lower extremity weeping edema.he was placed in bilateral Unna boots. He arrives today with pain in the back of his left leg. There is no open area on the right leg however there is a linear/wrap injury on the left leg and weeping edema on the left leg posteriorly. I spoke with infectious disease about 10 days ago. They were disappointed that the patient elected to discontinue prophylactic intramuscular penicillin shots as they felt it was particularly beneficial in reducing the frequency of his cellulitis. I discussed this with the patient today. He does not share this view. He'll definitely  need antibiotics today. Finally he is traveling to [place] and trauma leaving this Saturday and returning a week later and he does not travel with his pumps. He is going by car 01/30/18; patient was seen 4 days ago and brought back in today for review of cellulitis in the left leg posteriorly. I put him on amoxicillin this really hasn't helped as much as I might like. He is also worried because he is traveling to [facility] trauma by car. Finally we will be rewrapping him. There is no open area on the right leg over his left leg has multiple weeping areas as usual 02/09/18; The same wrap on for 10 days. He did not pick up the last doxycycline I prescribed for him. He apparently took 4 days worth he already had. There is nothing open on his right leg and the edema control is really quite good. He's had damage in the left leg medially and laterally especially probably related to the prolonged use of Unna boots 02/12/18; the patient arrived in clinic today for a nurse visit/wrap change. He complained of a lot of pain in the left posterior calf. He is taking doxycycline that I previously prescribed for him. Unfortunately even though he used his stockings and apparently used to compression pumps twice a day he has weeping edema coming out of the lateral part of his right leg. This is coming from the lower anterior lateral skin area. 02/16/18; the patient has finished his doxycycline and will finish the amoxicillin 2 days. The area of cellulitis in the left calf posteriorly has resolved. He is no longer having any pain. He tells me he is using his compression pumps at least once a day sometimes twice. 02/23/18; the patient finished his doxycycline and Amoxil last week. On Friday he noticed a small erythematous circle about the size of a quarter on the left lower leg just above his ankle. This rapidly expanded and he now has erythema on the lateral and posterior part of the thigh. This is bright red. Also has  an area on the dorsal foot just above his toes and a tender area just below the left popliteal fossa. He came off his prophylactic penicillin injections at his own insistence one or 2 months ago. This is obviously deteriorated  since then 03/02/18; patient is on doxycycline and Amoxil. Culture I did last week of the weeping area on the back of his left calf grew group B strep. I have therefore renewed the amoxicillin 500 3 times a day for a further week. He has not been systemically unwell. Still complaining of an area of discomfort right under his left popliteal fossa. There is no open wound on the right leg. He tells me that he is using his pumps twice a day on most days 03/09/18; patient arrives in clinic today completing his amoxicillin today. The cellulitis on his left leg is better. Furthermore he tells me that he had intramuscular penicillin shots that his primary care office today. However he also states that the wrap on his right leg fell down shortly after leaving clinic last week. He developed a large blister that was present when he came in for a nurse visit later in the week and then he developed intense discomfort around this area.He tells me he is using his compression pumps 03/16/18; the patient has completed his doxycycline. The infectious part of this/cellulitis in the left heel area left popliteal area is a lot better. He has 2 open areas on the right calf. Still areas on the left calf but this is a lot better as well. 03/24/18; the patient arrives complaining of pain in the left popliteal area again. He thinks some of this is wrap injury. He has no open area on the right leg and really no open area on the left calf either except for the popliteal area. He claims to be compliant with the compression pumps 03/31/18; I gave him doxycycline last week because of cellulitis in the left popliteal area. This is a lot better although the surface epithelium is denuded off and response to this.  He arrives today with uncontrolled edema in the right calf area as well as a fingernail injury in the right lateral calf. There is only a few open areas on the left 04/06/18; I gave him amoxicillin doxycycline over the last 2 weeks that the amoxicillin should be completing currently. He is not complaining of any pain or systemic symptoms. The only open areas see has is on the right lateral lower leg paradoxically I cannot see anything on the left lower leg. He tells me he is using his compression pumps twice a day on most days. Silver alginate to the wounds that are open under 4 layer compression 04/13/18; he completed antibiotics and has no new complaints. Using his compression pumps. Silver alginate that anything that's opened 04/20/18; he is using his compression pumps religiously. Silver alginate 4 layer compression anything that's opened. He comes in today with no open wounds on the left leg but 3 on the right including a new one posteriorly. He has 2 on the right lateral and one on the right posterior. He likes Unna boots on the right leg for reasons that aren't really clear we had the usual 4 layer compression on the left. It may be necessary to move to the 4 layer compression on the right however for now I left them in the Unna boots 04/27/18; he is using his compression pumps at least once a day. He has still the wounds on the right lateral calf. The area right posteriorly has closed. He does not have an open wound on the left under 4 layer compression however on the dorsal left foot just proximal to the toes and the left third toe 2 small open areas  were identified 05/11/18; he has not uses compression pumps. The areas on the right lateral calf have coalesced into one large wound necrotic surface. On the left side he has one small wound anteriorly however the edema is now weeping out of a large part of his left leg. He says he wasn't using his pumps because of the weeping fluid. I explained to  him that this is the time he needs to pump more 05/18/18; patient states he is using his compression pumps twice a day. The area on the right lateral large wound albeit superficial. On the left side he has innumerable number of small new wounds on the left calf particularly laterally but several anteriorly and medially. All these appear to have healthy granulated base these look like the remnants of blisters however they occurred under compression. The patient arrives in clinic today with his legs somewhat better. There is certainly less edema, less multiple open areas on the left calf and the right anterior leg looks somewhat better as well superficial and a little smaller. However he relates pain and erythema over the last 3-4 days in the thigh and I looked at this today. He has not been systemically unwell no fever no chills no change in blood sugar values 05/25/18; comes in today in a better state. The severe cellulitis on his left leg seems better with the Keflex. Not as tender. He has not been systemically unwell ooHard to find an open wound on the left lower leg using his compression pumps twice a day ooThe confluent wounds on his right lateral calf somewhat better looking. These will ultimately need debridement I didn't do this today. 06/01/18; the severe cellulitis on the left anterior thigh has resolved and he is completed his Keflex. ooThere is no open wound on the left leg however there is a superficial excoriation at the base of the third toe dorsally. Skin on the bottom of his left foot is macerated looking. ooThe left the wounds on the lateral right leg actually looks some better although he did require debridement of the top half of this wound area with an open curet 06/09/18 on evaluation today patient appears to be doing poorly in regard to his right lower extremity in particular this appears to likely be infected he has very thick purulent discharge along with a bright green  tent to the discharge. This makes me concerned about the possibility of pseudomonas. He's also having increased discomfort at this point on evaluation. Fortunately there does not appear to be any evidence of infection spreading to the other location at this time. 06/16/18 on evaluation today patient appears to actually be doing fairly well. His ulcer has actually diminished in size quite significantly at this point which is good news. Nonetheless he still does have some evidence of infection he did see infectious disease this morning before coming here for his appointment. I did review the results of their evaluation and their note today. They did actually have him discontinue the Cipro and initiate treatment with linezolid at this time. He is doing this for the next seven days and they recommended a follow-up in four months with them. He is the keep a log of the need for intermittent antibiotic therapy between now and when he falls back up with infectious disease. This will help them gaze what exactly they need to do to try and help them out. 06/23/18; the patient arrives today with no open wounds on the left leg and left third toe healed.  He is been using his compression pumps twice a day. On the right lateral leg he still has a sizable wound but this is a lot better than last time I saw this. 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 Has been using silver alginate under 4 layer compression 06/30/18; the only open wound he has is on the right lateral leg and this looks healthy. No debridement is required. We have been using silver alginate. He does not have an open wound on the left leg. There is apparently some drainage from the dorsal proximal third toe on the left although I see no open wound here. 07/03/18 on evaluation today patient was actually here just for a nurse visit rapid change.  However when he was here on Wednesday for his rat change due to having been healed on the left and then developing blisters we initiated the wrap again knowing that he would be back today for [place] to reevaluate and see were at. Unfortunately he has developed some cellulitis into the proximal portion of his right lower extremity even into the region of his thigh. 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 He was placed on one as what at that point. Nonetheless he is done with that and has been tolerating it well otherwise. Doxycycline which in the past really did not seem to be effective for him. Nonetheless I think the best option may be for [place] to definitely reinitiate the antibiotics for a longer period of time. 07/07/18; since I last saw this patient a week ago he has had a difficult time. At that point he did not have an open wound on his left leg. We transitioned him into juxta light stockings. He was apparently in the clinic the next day with blisters on the left lateral and left medial lower calf. He also had weeping edema fluid. He was put back into a compression wrap. He was also in the clinic on Friday with intense erythema in his right thigh. Per the patient he was started on Bactrim however that didn't work at all in terms of relieving his pain and swelling. He has taken 3 doxycycline that he had left over from last time and that seems to of helped. He has blistering on the right thigh as well. 07/14/18; the erythema on his right thigh has gotten better with doxycycline that he is finishing. 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 Presumably this cellulitis in the thigh was not related to that although I think this is a bit concerning going forward. He still has an area on the right lateral calf the blister on the right medial calf just below the knee that was discussed  above. On the left 2 small open areas left medial and left lateral. Edema control is adequate. He is using his compression pumps twice a day 07/20/18; continued improvement in the condition of both legs especially the edema in his bilateral thighs. He tells me he is been losing weight through a combination of diet and exercise. He is using his compression pumps twice a day. So overall she made to the remaining wounds 07/27/2018; continued improvement in condition of both legs. His edema is well controlled. The area on the right lateral leg is just about closed he  had one blisters show up on the medial left upper calf. We have him in 4 layer compression. He is going on a 10-day trip to [place], [place] and [place]. He will be driving. He wants to wear Unna boots because of the lessening amount of constriction. He will not use compression pumps while he is away 08/05/18 on evaluation today patient actually appears to be doing decently well all things considered in regard to his bilateral lower extremities. The worst ulcer is actually only posterior aspect of his left lower extremity with a four layer compression wrap cut into his leg a couple weeks back. He did have a trip and actually had [employer] for the trip that he is worn since he was last here. Nonetheless he feels like the [employer] actually do better for him his swelling is up a little bit but he also with his trip was not taking his Lasix on a regular set schedule like he was supposed to be. He states that obviously the reason being that he cannot drive and keep going without having to urinate too frequently which makes it difficult. He did not have his pumps with him while he was away either which I think also maybe playing a role here too. 08/13/2018; the patient only has a small open wound on the right lateral calf which is a big improvement in the last month or 2. He also has the area posteriorly just below the  posterior fossa on the left which I think was a wrap injury from several weeks ago. He has no current evidence of cellulitis. He tells me he is back into his compression pumps twice a day. He also tells me that while he was at the laundromat somebody stole a section of his extremitease stockings 08/20/2018; back in the clinic with a much improved state. He only has small areas on the right lateral mid calf which is just about healed. This was is more substantial area for quite a prolonged period of time. He has a small open area on the left anterior tibia. The area on the posterior calf just below the popliteal fossa is closed today. He is using his compression pumps twice a day 08/28/2018; patient has no open wound on the right leg. He has a smattering of open areas on the calf with some weeping lymphedema. More problematically than that it looks as though his wraps of slipped down in his usual he has very angry upper area of edema just below the right medial knee and on the right lateral calf. He has no open area on his feet. The patient is traveling to [facility] next week. I will send him in an antibiotic. We will continue to wrap the right leg. We ordered extremitease stockings for him last week and I plan to transition the right leg to a stocking when he gets home which will be in 10 days time. As usual he is very reluctant to take his pumps with him when he travels 09/07/2018; patient returns from [place]. He shows me a picture of his left leg in the mid part of his trip last week with intense fire engine erythema. The picture look bad enough I would have considered sending him to the hospital. Instead he went to the wound care center in [facility]. They did not prescribe him antibiotics but he did take some doxycycline he had leftover from a previous visit. I had given him trimethoprim sulfamethoxazole before  he left this did not work according to the patient. This is  resulted in some improvement fortunately. He comes back with a large wound on the left posterior calf. Smaller area on the left anterior tibia. Denuded blisters on the dorsal left foot over his toes. Does not have much in the way of wounds on the right leg although he does have a very tender area on the right posterior area just below the popliteal fossa also suggestive of infection. He promises me he is back on his pumps twice a day 09/15/2018; the intense cellulitis in his left lower calf is a lot better. The wound area on the posterior left calf is also so better. However he has reasonably extensive wounds on the dorsal aspect of his second and third toes and the proximal foot just at the base of the toes. There is nothing open on the right leg 09/22/2018; the patient has excellent edema control in his legs bilaterally. He is using his external compression pumps twice a day. He has no open area on the right leg and only the areas in the left foot dorsally second and third toe area on the left side. He does not have any signs of active cellulitis. 10/06/2018; the patient has good edema control bilaterally. He has no open wound on the right leg. There is a blister in the posterior aspect of his left calf that we had to deal with today. He is using his compression pumps twice a day. There is no signs of active cellulitis. We have been using silver alginate to the wound areas. He still has vulnerable areas on the base of his left first second toes dorsally He has a his extremities stockings and we are going to transition him today into the stocking on the right leg. He is cautioned that he will need to continue to use the compression pumps twice a day. If he notices uncontrolled edema in the right leg he may need to go to 3 times a day. 10/13/2018; the patient came in for a nurse check on Friday he has a large flaccid blister on the right medial calf just below the knee. We unroofed this. He has  this and a new area underneath the posterior mid calf which was undoubtedly a blister as well. He also has several small areas on the right which is the area we put his extremities stocking on. 10/19/2018; the patient went to see infectious disease this morning I am not sure if that was a routine follow-up in any case the doxycycline I had given him was discontinued and started on linezolid. He has not started this. It is easy to look at his left calf and the inflammation and think this is cellulitis however he is very tender in the tissue just below the popliteal fossa and I have no doubt that there is infection going on here. He states the problem he is having is that with the compression pumps the edema goes down and then starts walking the wrap falls down. We will see if we can adhere this. He has 1 or 2 minuscule open areas on the right still areas that are weeping on the posterior left calf, the base of his left second and third toes 10/26/18; back today in clinic with quite of skin breakdown in his left anterior leg. This may have been infection the area below the popliteal fossa seems a lot better however tremendous epithelial loss on the left anterior mid tibia area over  quite inexpensive tissue. He has 2 blisters on the right side but no other open wound here. 10/29/2018; came in urgently to see [place] today and we worked him in for review. He states that the 4 layer compression on the right leg caused pain he had to cut it down to roughly his mid calf this caused swelling above the wrap and he has blisters and skin breakdown today. As a result of the pain he has not been using his pumps. Both legs are a lot more edematous and there is a lot of weeping fluid. 11/02/18; arrives in clinic with continued difficulties in the right leg> left. Leg is swollen and painful. multiple skin blisters and new open areas especially laterally. He has not been using his pumps on the right leg. 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 He is not systemically unwell. 11/09/2018; the patient claims he is being compliant with his pumps. He is finished the doxycycline I gave him last week. Culture I did of the wound on the right lateral leg showed a few very resistant methicillin staph aureus. This was resistant to doxycycline. Nevertheless he states the pain in the leg is a lot better which makes me wonder if the cultured organism was not really what was causing the problem nevertheless this is a very dangerous organism to be culturing out of any wound. His right leg is still a lot larger than the left. He is using an Radio broadcast assistant on this area, he blames a 4-layer compression for causing the original skin breakdown which I doubt is true however I cannot talk him out of it. We have been using silver alginate to all of these areas which were initially blisters 11/16/2018; patient is being compliant with his external compression pumps at twice a day. Miraculously he arrives in clinic today with absolutely no open wounds. He has better edema control on the left where he has been using 4 layer compression versus wound of wounds on the right and I pointed this out to him. There is no inflammation in the skin in his lower legs which is also somewhat unusual for him. There is no open wounds on the dorsal left foot. He has extremitease stockings at home and I have asked him to bring these in next week. 11/25/18 patient's lower extremity on examination today on the left appears for the most part to be wound free. He does have an open wound on the lateral aspect of the right lower extremity but this is minimal compared to what I've seen in past. He does request that we go ahead and wrap the left leg as well even though there's nothing open just so hopefully it will not reopen in short order. 1/28; patient has superficial open wounds on the right lateral calf left anterior  calf and left posterior calf. His edema control is adequate. 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 We have been using silver alginate as the primary dressing. He claims compliance with his compression pumps 2/4; patient has superficial open wounds on numerous areas of his left calf and again one on the left dorsal foot. The areas on the right lateral calf have healed. The cellulitis that I gave him doxycycline for last week is also resolved this was mostly on the left anterior calf just below the tibial tuberosity. His edema looks fairly well-controlled. He  tells me he went to see his primary doctor today and had blood work ordered 2/11; once again he has several open areas on the left calf left tibial area. Most of these are small and appear to have healthy granulation. He does not have anything open on the right. The edema and control in his thighs is pretty good which is usually a good indication he has been using his pumps as requested. 2/18; he continues to have several small areas on the left calf and left tibial area. Most of these are small healthy granulation. We put him in his stocking on the right leg last week and he arrives with a superficial open area over the right upper tibia and a fairly large area on the right lateral tibia in similar condition. His edema control actually does not look too bad, he claims to be using his compression pumps twice a day 2/25. Continued small areas on the left calf and left tibial area. New areas especially on the right are identified just below the tibial tuberosity and on the right upper tibia itself. There are also areas of weeping edema fluid even without an obvious wound. He does not have a considerable degree of lymphedema but clearly there is more edema here than his skin can handle. He states he is using the pumps twice a day. We have an Unna boot on the  right and 4 layer compression on the left. 3/3; he continues to have an area on the right lateral calf and right posterior calf just below the popliteal fossa. There is a fair amount of tenderness around the wound on the popliteal fossa but I did not see any evidence of cellulitis, could just be that the wrap came down and rubbed in this area. ooHe does not have an open area on the left leg however there is an area on the left dorsal foot at the base of the third toe ooWe have been using silver alginate to all wound areas 3/10; he did not have an open area on his left leg last time he was here a week ago. Today he arrives with a horizontal wound just below the tibial tuberosity and an area on the left lateral calf. He has intense erythema and tenderness in this area. The area is on the right lateral calf and right posterior calf better than last week. We have been using silver alginate as usual 3/18 - Patient returns with 3 small open areas on left calf, and 1 small open area on right calf, the skin looks ok with no significant erythema, he continues the UNA boot on right and 4 layer compression on left. The right lateral calf wound is closed , the right posterior is small area. we will continue silver alginate to the areas. 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 He is been tolerating the dressing changes without complication. Fortunately she has made excellent improvement in regard to the overall status of his wounds. Unfortunately every time we cease wrapping him he ends up reopening in causing more significant issues at that point. Again I'm unsure of the best direction to take although I think the lymphedema clinic may be appropriate for him. 02/03/19 on evaluation today patient appears to be  doing well in regard to the wounds that we saw him  for last week unfortunately he has a new area on the proximal portion of his right medial/posterior lower extremity where the wrap somewhat slowed down and caused swelling and a blister to rub and open. Unfortunately this is the only opening that he has on either leg at this point. 02/17/19 on evaluation today patient's bilateral lower extremities appear to be doing well. He still completely healed in regard to the left lower extremity. In regard to the right lower extremity the area where the wrap and slid down and caused the blister still seems to be slightly open although this is dramatically better than during the last evaluation two weeks ago. I'm very pleased with the way this stands overall. 03/03/19 on evaluation today patient appears to be doing well in regard to his right lower extremity in general although he did have a new blister open this does not appear to be showing any evidence of active infection at this time. Fortunately there's No fevers, chills, nausea, or vomiting noted at this time. Overall I feel like he is making good progress it does feel like that the right leg will we perform the [employer] seems to do with a bit better than three layer wrap on the left which slid down on him. We may switch to doing bilateral in the book wraps. 5/4; I have not seen Mr. Caprara in quite some time. According to our case manager he did not have an open wound on his left leg last week. He had 1 remaining wound on the right posterior medial calf. He arrives today with multiple openings on the left leg probably were blisters and/or wrap injuries from Unna boots. I do not think the Unna boot's will provide adequate compression on the left. I am also not clear about the frequency he is using the compression pumps. 03/17/19 on evaluation today patient appears to be doing excellent in regard to his lower extremities compared to last week's evaluation  apparently. He had gotten significantly worse last week which is unfortunate. The [employer] wrap on the left did not seem to do very well for him at all and in fact it didn't control his swelling significantly enough he had an additional outbreak. Subsequently we go back to the four layer compression wrap on the left. This is good news. At least in that he is doing better and the wound seem to be killing him. He still has not heard anything from the lymphedema clinic. 03/24/19 on evaluation today patient actually appears to be doing much better in regard to his bilateral lower Trinity as compared to last week when I saw him. Fortunately there's no signs of active infection at this time. He has been tolerating the dressing changes without complication. Overall I'm extremely pleased with the progress and appearance in general. 04/07/19 on evaluation today patient appears to be doing well in regard to his bilateral lower extremities. His swelling is significantly down from where it was previous. With that being said he does have a couple blisters still open at this point but fortunately nothing that seems to be too severe and again the majority of the larger openings has healed at this time. 04/14/19 on evaluation today patient actually appears to be doing quite well in regard to his bilateral lower extremities in fact I'm not even sure there's anything significantly open at this time at any site. Nonetheless he did have some trouble with these wraps where they are somewhat irritating him secondary to the fact that  he has noted that the graph wasn't too close down to the end of this foot in a little bit short as well up to his knee. Otherwise things seem to be doing quite well. 04/21/19 upon evaluation today patient's wound bed actually showed evidence of being completely healed in regard to both lower extremities which is excellent news. There does not appear to be any signs of active infection which  is also good news. I'm very pleased in this regard. No fevers, chills, nausea, or vomiting noted at this time. 04/28/19 on evaluation today patient appears to be doing a little bit worse in regard to both lower extremities on the left mainly due to the fact that when he went infection disease the wrap was not wrapped quite high enough he developed a blister above this. On the right he is a small open area of nothing too significant but again this is continuing to give him some trouble he has been were in the Velcro compression that he has at home. 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 He's been tolerating the dressing changes without complication. Fortunately there's no signs of active infection at this time. No fevers, chills, nausea, or vomiting noted at this time. We have been trying to get an appointment with her lymphedema clinic in [facility] but unfortunately nobody can get them on phone with not been able to even fax information over the patient likewise is not been able to get in touch with them. Overall I'm not sure exactly what's going on here with to reach out again today. 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 Still having a lot of drainage unfortunately. He tells me especially in the left but even on the right. There's no signs of active infection which is good news we've been using so ratcheted up to this point. 05/19/19 on evaluation today patient actually appears to be doing quite well with regard to his left lower extremity which is great news. Fortunately in regard to the right lower extremity has an issues with his wrap and he subsequently did remove this from what I'm understanding. Nonetheless long story short is what he had rewrapped once he removed it subsequently had maggots underneath this wrap whenever  he came in for evaluation today. With that being said they were obviously completely cleaned away by the nursing staff. The visit today which is excellent news. However he does appear to potentially have some infection around the right ankle region where the maggots were located as well. He will likely require anabiotic therapy today. 05/26/19 on evaluation today patient actually appears to be doing much better in regard to his bilateral lower extremities. I feel like the infection is under much better control. With that being said there were maggots noted when the wrap was removed yet again today. Again this could have potentially been left over from previous although at this time there does not appear to be any signs of significant drainage there was obviously on the wrap some drainage as well this contracted gnats or otherwise. Either way I do not see anything that appears to be doing worse in my pinion and in fact I think his drainage has slowed down quite significantly likely mainly due to the fact to his infection being under better control. 06/02/2019 on evaluation today patient actually appears to be doing well  with regard to his bilateral lower extremities there is no signs of active infection at this time which is great news. With that being said he does have several open areas more so on the right than the left but nonetheless these are all significantly better than previously noted. 06/09/2019 on evaluation today patient actually appears to be doing well. His wrap stayed up and he did not cause any problems he had more drainage on the right compared to the left but overall I do not see any major issues at this time which is great news. 06/16/2019 on evaluation today patient appears to be doing excellent with regard to his lower extremities the only area that is open is a new blister that can have opened as of today on the medial ankle on the left. Other than this he really seems to be doing  great I see no major issues at this point. 06/23/2019 on evaluation today patient appears to be doing quite well with regard to his bilateral lower extremities. In fact he actually appears to be almost completely healed there is a small area of weeping noted of the right lower extremity just above the ankle. Nonetheless fortunately there is no signs of active infection at this time which is good news. No fevers, chills, nausea, vomiting, or diarrhea. 8/24; the patient arrived for a nurse visit today but complained of very significant pain in the left leg and therefore I was asked to look at this. Noted that he did not have an open area on the left leg last week nevertheless this was wrapped. The patient states that he is not been able to put his compression pumps on the left leg because of the discomfort. He has not been systemically unwell 06/30/2019 on evaluation today patient unfortunately despite being excellent last week is doing much worse with regard to his left lower extremity today. In fact he had to come in for a nurse on Monday where his left leg had to be rewrapped due to excessive weeping Dr. Leanord Hawking placed him on doxycycline at that point. Fortunately there is no signs of active infection Systemically at this time which is good news. 07/07/2019 in regard to the patient's wounds today he actually seems to be doing well with his right lower extremity there really is nothing open or draining at this point this is great news. Unfortunately the left lower extremity is given him additional trouble at this time. There does not appear to be any signs of active infection nonetheless he does have a lot of edema and swelling noted at this point as well as blistering all of which has led to a much more poor appearing leg at this time compared to where it was 2 weeks ago when it was almost completely healed. Obviously this is a little discouraging for the patient. He is try to contact the lymphedema  clinic in [place] he has not been able to get through to them. 07/14/2019 on evaluation today patient actually appears to be doing slightly better with regard to his left lower extremity ulcers. Overall I do feel like at least at the top of the wrap that we have been placing this area has healed quite nicely and looks much better. The remainder of the leg is showing signs of improvement. Unfortunately in the thigh area he still has an open region on the left and again on the right he has been utilizing just a Band-Aid on an area that also opened on the thigh.  Again this is an area that were not able to wrap although we did do an Ace wrap to provide some compression that something that obviously is a little less effective than the compression wraps we have been using on the lower portion of the leg. He does have an appointment with the lymphedema clinic in [facility] on Friday. 07/21/2019 on evaluation today patient appears to be doing better with regard to his lower extremity ulcers. He has been tolerating the dressing changes without complication. Fortunately there is no signs of active infection at this time. No fevers, chills, nausea, vomiting, or diarrhea. I did receive the paperwork from the physical therapist at the lymphedema clinic in [place]. Subsequently I signed off on that this morning and sent that back to him for further progression with the treatment plan. 07/28/2019 on evaluation today patient appears to be doing very well with regard to his right lower extremity where I do not see any open wounds at this point. Fortunately he is feeling great as far as that is concerned as well. In regard to the left lower extremity he has been having issues with still several areas of weeping and edema although the upper leg is doing better his lower leg still I think is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

## 2019-10-08 ENCOUNTER — Other Ambulatory Visit: Payer: Self-pay

## 2019-10-08 ENCOUNTER — Encounter (HOSPITAL_BASED_OUTPATIENT_CLINIC_OR_DEPARTMENT_OTHER): Payer: Medicare Other | Admitting: Internal Medicine

## 2019-10-08 DIAGNOSIS — Z9119 Patient's noncompliance with other medical treatment and regimen: Secondary | ICD-10-CM | POA: Diagnosis not present

## 2019-10-08 DIAGNOSIS — I87333 Chronic venous hypertension (idiopathic) with ulcer and inflammation of bilateral lower extremity: Secondary | ICD-10-CM | POA: Diagnosis not present

## 2019-10-08 DIAGNOSIS — E1151 Type 2 diabetes mellitus with diabetic peripheral angiopathy without gangrene: Secondary | ICD-10-CM | POA: Diagnosis not present

## 2019-10-08 DIAGNOSIS — L97222 Non-pressure chronic ulcer of left calf with fat layer exposed: Secondary | ICD-10-CM | POA: Diagnosis not present

## 2019-10-08 DIAGNOSIS — E11621 Type 2 diabetes mellitus with foot ulcer: Secondary | ICD-10-CM | POA: Diagnosis not present

## 2019-10-08 DIAGNOSIS — L03116 Cellulitis of left lower limb: Secondary | ICD-10-CM | POA: Diagnosis not present

## 2019-10-08 DIAGNOSIS — E11622 Type 2 diabetes mellitus with other skin ulcer: Secondary | ICD-10-CM | POA: Diagnosis not present

## 2019-10-08 DIAGNOSIS — I482 Chronic atrial fibrillation, unspecified: Secondary | ICD-10-CM | POA: Diagnosis not present

## 2019-10-08 DIAGNOSIS — L97212 Non-pressure chronic ulcer of right calf with fat layer exposed: Secondary | ICD-10-CM | POA: Diagnosis not present

## 2019-10-08 DIAGNOSIS — I872 Venous insufficiency (chronic) (peripheral): Secondary | ICD-10-CM | POA: Diagnosis not present

## 2019-10-08 DIAGNOSIS — I890 Lymphedema, not elsewhere classified: Secondary | ICD-10-CM | POA: Diagnosis not present

## 2019-10-08 DIAGNOSIS — I272 Pulmonary hypertension, unspecified: Secondary | ICD-10-CM | POA: Diagnosis not present

## 2019-10-08 DIAGNOSIS — L97512 Non-pressure chronic ulcer of other part of right foot with fat layer exposed: Secondary | ICD-10-CM | POA: Diagnosis not present

## 2019-10-08 DIAGNOSIS — Z794 Long term (current) use of insulin: Secondary | ICD-10-CM | POA: Diagnosis not present

## 2019-10-08 NOTE — Progress Notes (Signed)
DHRUV, CHRISTINA ([id number]) Visit Report for 10/08/2019 SuperBill Details Patient Name: Date of Service: [name], [name] 10/08/2019 Medical Record [id number] Patient Account Number: [id number] Date of Birth/Sex: Treating RN: 1951-04-22 (68 y.o. 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

## 2019-10-12 NOTE — Progress Notes (Signed)
JERMIAH, SODERMAN ([id number]) Visit Report for 08/19/2019 Arrival Information Details Patient Name: Date of Service: JODECI, ROARTY 08/19/2019 3:30 PM Medical Record [id number] Patient Account Number: [id number] Date of Birth/Sex: Treating RN: 01/25/1951 (68 y.o. Judie Petit) Yevonne Pax Primary Care Valery Amedee: Nicoletta Ba Other Clinician: Referring Camilia Caywood: Treating Devynne Sturdivant/Extender:Robson, Lottie Rater, [name] in Treatment: 186 Visit Information History Since Last Visit Walker All ordered tests and consults were completed: No Patient Arrived: Added or deleted any medications: No Arrival Time: 11:36 Any new allergies or adverse reactions: No Accompanied By: self Had a fall or experienced change in No Transfer Assistance: None activities of daily living that may affect Patient Identification Verified: Yes risk of falls: Secondary Verification Process Completed: Yes Signs or symptoms of abuse/neglect since last No Patient Requires Transmission-Based No visito Precautions: Hospitalized since last visit: No Patient Has Alerts: Yes Implantable device outside of the clinic excluding No cellular tissue based products placed in the center since last visit: Has Dressing in Place as Prescribed: Yes Has Compression in Place as Prescribed: Yes Pain Present Now: No Electronic Signature(s) Signed: 10/12/2019 3:01:15 PM By: Yevonne Pax RN Entered By: Yevonne Pax on 08/20/2019 11:36:52 -------------------------------------------------------------------------------- Compression Therapy Details Patient Name: Date of Service: DAVIS, AMBROSINI 08/19/2019 3:30 PM Medical Record [id number] Patient Account Number: [id number] Date of Birth/Sex: Treating RN: April 13, 1951 (68 y.o. Judie Petit) Yevonne Pax Primary Care Dylynn Ketner: Nicoletta Ba Other Clinician: Referring Deliliah Spranger: Treating Casmer Yepiz/Extender:Robson, Lottie Rater, [name] in Treatment: 186 Compression Therapy  Performed for Wound Wound #139 Left,Anterior Upper Leg Assessment: Performed By: Little Ishikawa, RN Compression Type: Four Layer Electronic Signature(s) Signed: 10/12/2019 3:01:15 PM By: Yevonne Pax RN Entered By: Yevonne Pax on 08/20/2019 11:41:20 -------------------------------------------------------------------------------- Encounter Discharge Information Details Patient Name: Date of Service: [name] 08/19/2019 3:30 PM Medical Record [id number] Patient Account Number: [id number] Date of Birth/Sex: Treating RN: Aug 24, 1951 (68 y.o. Judie Petit) Yevonne Pax Primary Care Marjory Meints: Nicoletta Ba Other Clinician: Referring Ebony Yorio: Treating Codie Krogh/Extender:Robson, Lottie Rater, [name] in Treatment: 186 Encounter Discharge Information Items Discharge Condition: Stable Ambulatory Status: Walker Discharge Destination: Home Transportation: Private Auto Accompanied By: self Schedule Follow-up Appointment: Yes Clinical Summary of Care: Patient Declined Electronic Signature(s) Signed: 10/12/2019 3:01:15 PM By: Yevonne Pax RN Entered By: Yevonne Pax on 08/20/2019 11:42:22 -------------------------------------------------------------------------------- Patient/Caregiver Education Details Patient Name: Date of Service: [name] 10/15/2020andnbsp3:30 PM Medical Record [id number] Patient Account Number: [id number] Date of Birth/Gender: Treating RN: July 22, 1951 (68 y.o. Judie Petit) Yevonne Pax Primary Care Physician: Nicoletta Ba Other Clinician: Referring Physician: Treating Physician/Extender:Robson, Lottie Rater, [name] in Treatment: 186 Education Assessment Education Provided To: Patient Education Topics Provided Wound/Skin Impairment: Methods: Explain/Verbal Responses: State content correctly Electronic Signature(s) Signed: 10/12/2019 3:01:15 PM By: Yevonne Pax RN Entered By: Yevonne Pax on 08/20/2019  11:42:05 -------------------------------------------------------------------------------- Wound Assessment Details Patient Name: Date of Service: KEONTE, DAUBENSPECK 08/19/2019 3:30 PM Medical Record [id number] Patient Account Number: [id number] Date of Birth/Sex: Treating RN: August 05, 1951 (68 y.o. Judie Petit) Yevonne Pax Primary Care Emillio Ngo: Nicoletta Ba Other Clinician: Referring Kahdijah Errickson: Treating Shadai Mcclane/Extender:Robson, Lottie Rater, [name] in Treatment: 186 Wound Status Wound Number: 139 Primary Etiology: Lymphedema Wound Location: Left, Anterior Upper Leg Wound Status: Open Wounding Event: Blister Date Acquired: 08/13/2019 Weeks Of Treatment: 0 Clustered Wound: No Wound Measurements Length: (cm) 0.5 % Reduc Width: (cm) 0.5 % Reduc Depth: (cm) 0.1 Area: (cm) 0.196 Volume: (cm) 0.02 Wound Description Classification: Full Thickness Without Exposed Support Structures tion in Area: 0% tion in Volume: 0% Electronic Signature(s) Signed: 10/12/2019 3:01:15 PM By:  [name] RN Entered By: [name] on 08/20/2019 11:38:06

## 2019-10-12 NOTE — Progress Notes (Signed)
Kevin, [name] ([id number]) Visit Report for 07/21/2019 Arrival Information Details Patient Name: Date of Service: [name], [name] 07/21/2019 12:45 PM Medical Record [id number] Patient Account Number: [id number] Date of Birth/Sex: Treating RN: [birth year] (68 y.o. [name]) [name] Primary Care Ayonna Speranza: [name] Other Clinician: Referring Ona Rathert: Treating Yoshito Gaza/Extender:[name], Dema Severin, [name] in Treatment: 81 Visit Information History Since Last Visit All ordered tests and consults were completed: No Patient Arrived: Ambulatory Added or deleted any medications: No Arrival Time: 13:23 Any new allergies or adverse reactions: No Accompanied By: self Had a fall or experienced change in No Transfer Assistance: None activities of daily living that may affect Patient Identification Verified: Yes risk of falls: Secondary Verification Process Yes Signs or symptoms of abuse/neglect since last No Completed: visito Patient Requires Transmission-Based No Hospitalized since last visit: No Precautions: Implantable device outside of the clinic excluding No Patient Has Alerts: Yes cellular tissue based products placed in the center since last visit: Has Dressing in Place as Prescribed: Yes Has Compression in Place as Prescribed: Yes Pain Present Now: No Electronic Signature(s) Signed: 10/12/2019 3:04:22 PM By: [name] RN Entered By: [name] on 07/21/2019 13:23:50 -------------------------------------------------------------------------------- Compression Therapy Details Patient Name: Date of Service: Kevin, [name] 07/21/2019 12:45 PM Medical Record [id number] Patient Account Number: [id number] Date of Birth/Sex: Treating RN: 01/21/1951 (68 y.o. [name] Primary Care Philana Younis: [name] Other Clinician: Referring Marria Mathison: Treating Arraya Buck/Extender:[name], Dema Severin, [name] in Treatment: 182 Compression  Therapy Performed for Wound NonWound Condition Lymphedema - Right Leg Assessment: Performed By: Clinician Deon Pilling, RN Compression Type: [name] Post Procedure Diagnosis Same as Pre-procedure Electronic Signature(s) Signed: 07/21/2019 7:10:00 PM By: Baruch Gouty RN, BSN Entered By: Baruch Gouty on 07/21/2019 14:03:49 -------------------------------------------------------------------------------- Compression Therapy Details Patient Name: Date of Service: [name] 07/21/2019 12:45 PM Medical Record [id number] Patient Account Number: [id number] Date of Birth/Sex: Treating RN: 1951/09/20 (68 y.o. [name] Primary Care Margarethe Virgen: [name] Other Clinician: Referring Karron Alvizo: Treating Griselle Rufer/Extender:[name], Dema Severin, [name] in Treatment: 182 Compression Therapy Performed for Wound Wound #131 Left,Lateral Lower Leg Assessment: Performed By: Clinician Deon Pilling, RN Compression Type: Four Layer Post Procedure Diagnosis Same as Pre-procedure Electronic Signature(s) Signed: 07/21/2019 7:10:00 PM By: Baruch Gouty RN, BSN Entered By: Baruch Gouty on 07/21/2019 14:04:17 -------------------------------------------------------------------------------- Encounter Discharge Information Details Patient Name: Date of Service: [name]. 07/21/2019 12:45 PM Medical Record [id number] Patient Account Number: [id number] Date of Birth/Sex: Treating RN: [birth year] (68 y.o. [name] Primary Care Illyria Sobocinski: [name] Other Clinician: Referring Clarissia Mckeen: Treating Nathian Stencil/Extender:[name], Dema Severin, [name] in Treatment: 182 Encounter Discharge Information Items Discharge Condition: Stable Ambulatory Status: Walker Discharge Destination: Home Transportation: Private Auto Accompanied By: self Schedule Follow-up Appointment: Yes Clinical Summary of Care: Electronic Signature(s) Signed: 07/21/2019  7:20:57 PM By: Deon Pilling Entered By: Deon Pilling on 07/21/2019 17:35:32 -------------------------------------------------------------------------------- Lower Extremity Assessment Details Patient Name: Date of Service: Kevin, [name] 07/21/2019 12:45 PM Medical Record [id number] Patient Account Number: [id number] Date of Birth/Sex: Treating RN: Mar 21, 1951 (68 y.o. [name]) [name] Primary Care Tessica Cupo: [name] Other Clinician: Referring Alfreddie Consalvo: Treating Esker Dever/Extender:[name], Dema Severin, [name] in Treatment: 182 Edema Assessment Assessed: [Left: No] [Right: No] Edema: [Left: Yes] [Right: No] Calf Left: Right: Point of Measurement: 31 cm From Medial Instep 44 cm 40 cm Ankle Left: Right: Point of Measurement: 12 cm From Medial Instep 32 cm 30 cm Electronic Signature(s) Signed: 10/12/2019 3:04:22 PM By: [name] RN  Entered By: [name] on 07/21/2019 13:47:05 -------------------------------------------------------------------------------- Multi-Disciplinary Care Plan Details Patient Name: Date of Service: Kevin, [name] 07/21/2019 12:45 PM Medical Record [id number] Patient Account Number: [id number] Date of Birth/Sex: Treating RN: [birth year] (68 y.o. [name] Primary Care Arlis Yale: [name] Other Clinician: Referring Kailah Pennel: Treating Lorilee Cafarella/Extender:[name], Dema Severin, [name] in Treatment: 182 Active Inactive Venous Leg Ulcer Nursing Diagnoses: Actual venous Insuffiency (use after diagnosis is confirmed) Goals: Patient will maintain optimal edema control Date Initiated: 09/10/2016 Target Resolution Date: 07/28/2019 Goal Status: Active Verify adequate tissue perfusion prior to therapeutic compression application Date Initiated: 09/10/2016 Date Inactivated: 11/28/2016 Goal Status: Met Interventions: Assess peripheral edema status every visit. Compression as ordered Provide education on venous  insufficiency Notes: edema not contolled above wraps, pt not using lymoh pumps regularly Wound/Skin Impairment Nursing Diagnoses: Impaired tissue integrity Goals: Patient/caregiver will verbalize understanding of skin care regimen Date Initiated: 09/10/2016 Target Resolution Date: 07/28/2019 Goal Status: Active Interventions: Assess patient/caregiver ability to perform ulcer/skin care regimen upon admission and as needed Assess ulceration(s) every visit Provide education on ulcer and skin care Notes: Electronic Signature(s) Signed: 07/21/2019 7:10:00 PM By: Baruch Gouty RN, BSN Entered By: Baruch Gouty on 07/21/2019 14:02:35 -------------------------------------------------------------------------------- Pain Assessment Details Patient Name: Date of Service: [name] 07/21/2019 12:45 PM Medical Record [id number] Patient Account Number: [id number] Date of Birth/Sex: Treating RN: 08-21-1951 (68 y.o. [name]) [name] Primary Care Faith Patricelli: [name] Other Clinician: Referring Joao Mccurdy: Treating Rossanna Spitzley/Extender:[name], Dema Severin, [name] in Treatment: 182 Active Problems Location of Pain Severity and Description of Pain Patient Has Paino No Site Locations Pain Management and Medication Current Pain Management: Electronic Signature(s) Signed: 10/12/2019 3:04:22 PM By: [name] RN Entered By: [name] on 07/21/2019 13:24:48 -------------------------------------------------------------------------------- Patient/Caregiver Education Details Patient Name: Date of Service: [name] 9/16/2020andnbsp12:45 PM Medical Record [id number] Patient Account Number: [id number] Date of Birth/Gender: Treating RN: 08/14/1951 (68 y.o. [name] Primary Care Physician: [name] Other Clinician: Referring Physician: Treating Physician/Extender:[name], Dema Severin, [name] in Treatment: [place] Education  Assessment Education Provided To: Patient Education Topics Provided Venous: Methods: Explain/Verbal Responses: Reinforcements needed, State content correctly Wound/Skin Impairment: Methods: Explain/Verbal Responses: Reinforcements needed, State content correctly Electronic Signature(s) Signed: 07/21/2019 7:10:00 PM By: Baruch Gouty RN, BSN Entered By: Baruch Gouty on 07/21/2019 14:03:08 -------------------------------------------------------------------------------- Wound Assessment Details Patient Name: Date of Service: [name]. 07/21/2019 12:45 PM Medical Record [id number] Patient Account Number: [id number] Date of Birth/Sex: Treating RN: 03/19/1951 (68 y.o. [name]) [name] Primary Care Shaye Elling: [name] Other Clinician: Referring Yenty Bloch: Treating Oretta Berkland/Extender:[name], Dema Severin, [name] in Treatment: 182 Wound Status Wound Number: 130 Primary Lymphedema Etiology: Wound Location: Left Lower Leg - Medial Wound Open Wounding Event: Blister Status: Date Acquired: 07/07/2019 Comorbid Chronic sinus problems/congestion, Weeks Of Treatment: 2 History: Arrhythmia, Hypertension, Peripheral Arterial Clustered Wound: No Disease, Type II Diabetes, History of Burn, Gout, Confinement Anxiety Photos Wound Measurements Length: (cm) 5 Width: (cm) 5.5 Depth: (cm) 0.1 Area: (cm) 21.598 Volume: (cm) 2.16 Wound Description Classification: Partial Thickness Wound Margin: Flat and Intact Exudate Amount: Medium Exudate Type: Serous Exudate Color: amber Wound Bed Granulation Amount: Large (67-100%) Granulation Quality: Pink Necrotic Amount: None Present (0%) After Cleansing: No rino No Exposed Structure osed: No (Subcutaneous Tissue) Exposed: No osed: No osed: No xposed: No posed: No % Reduction in Area: -1566.5% % Reduction in Volume: -1561.5% Epithelialization: None Tunneling: No Undermining: No Foul Odor Slough/Fib Fascia  Exp Fat Layer Tendon Exp Muscle Exp  Joint E Bone Ex Electronic Signature(s) Signed: 07/22/2019 4:16:29 PM By: Mikeal Hawthorne EMT/HBOT Signed: 10/12/2019 3:04:22 PM By: [name] RN Entered By: Mikeal Hawthorne on 07/22/2019 10:36:12 -------------------------------------------------------------------------------- Wound Assessment Details Patient Name: Date of Service: [name]. 07/21/2019 12:45 PM Medical Record [id number] Patient Account Number: [id number] Date of Birth/Sex: Treating RN: [birth year] (68 y.o. [name]) [name] Primary Care Cariana Karge: [name] Other Clinician: Referring Janson Lamar: Treating Saloni Lablanc/Extender:[name], Dema Severin, [name] in Treatment: 182 Wound Status Wound Number: [id number] Primary Lymphedema Etiology: Wound Location: Left Lower Leg - Lateral Wound Open Wounding Event: Blister Status: Date Acquired: 07/07/2019 Comorbid Chronic sinus problems/congestion, Weeks Of Treatment: 2 History: Arrhythmia, Hypertension, Peripheral Arterial Clustered Wound: Yes Disease, Type II Diabetes, History of Burn, Gout, Confinement Anxiety Photos Wound Measurements Length: (cm) 7.5 % Reduc Width: (cm) 13 % Reduc Depth: (cm) 0.1 Epithel Clustered Quantity: 5 Tunneli Area: (cm) 76.576 Underm Volume: (cm) 7.658 Wound Description Full Thickness Without Exposed Support Foul O Classification: Structures Slough Wound Flat and Intact Margin: Exudate Large Large Amount: Exudate Serous Type: Exudate amber Color: Wound Bed Granulation Amount: Large (67-100%) Granulation Quality: Pink Fascia Expo Necrotic Amount: None Present (0%) Fat Layer ( Tendon Expo Muscle Expo Joint Expos Bone Expose dor After Cleansing: No /Fibrino No Exposed Structure sed: No Subcutaneous Tissue) Exposed: Yes sed: No sed: No ed: No d: No tion in Area: -13822.9% tion in Volume: -13823.6% ialization: None ng: No ining: No Electronic  Signature(s) Signed: 07/22/2019 4:16:29 PM By: Mikeal Hawthorne EMT/HBOT Signed: 10/12/2019 3:04:22 PM By: [name] RN Entered By: Mikeal Hawthorne on 07/22/2019 10:36:50 -------------------------------------------------------------------------------- Wound Assessment Details Patient Name: Date of Service: [name]. 07/21/2019 12:45 PM Medical Record [id number] Patient Account Number: [id number] Date of Birth/Sex: Treating RN: 1951-03-23 (68 y.o. [name]) [name] Primary Care Amorita Vanrossum: [name] Other Clinician: Referring Shella Lahman: Treating Aerionna Moravek/Extender:[name], Dema Severin, [name] in Treatment: 182 Wound Status Wound Number: 132 Primary Lymphedema Etiology: Wound Location: Left Upper Leg - Medial Wound Open Wounding Event: Blister Status: Date Acquired: 07/14/2019 Comorbid Chronic sinus problems/congestion, Weeks Of Treatment: 1 History: Arrhythmia, Hypertension, Peripheral Arterial Clustered Wound: No Disease, Type II Diabetes, History of Burn, Gout, Confinement Anxiety Photos Wound Measurements Length: (cm) 2.5 % Reduction Width: (cm) 3 % Reduc Depth: (cm) 0.1 Epithel Area: (cm) 5.89 Tunnel Volume: (cm) 0.589 Underm Wound Description Full Thickness Without Exposed Support Classification: Structures Wound Distinct, outline attached Margin: Exudate Medium Amount: Exudate Serosanguineous Type: Exudate red, brown Color: Wound Bed Granulation Amount: Large (67-100%) Granulation Quality: Pink, Pale Necrotic Amount: None Present (0%) Foul Odor After Cleansing: No Slough/Fibrino No Exposed Structure Fascia Exposed: No Fat Layer (Subcutaneous Tissue) Exposed: No Tendon Exposed: No Muscle Exposed: No Joint Exposed: No Bone Exposed: No in Area: -66.7% tion in Volume: -66.9% ialization: Small (1-33%) ing: No ining: No Electronic Signature(s) Signed: 07/22/2019 4:16:29 PM By: Mikeal Hawthorne EMT/HBOT Signed: 10/12/2019 3:04:22 PM By:  [name] RN Entered By: Mikeal Hawthorne on 07/22/2019 10:37:28 -------------------------------------------------------------------------------- Wound Assessment Details Patient Name: Date of Service: [name]. 07/21/2019 12:45 PM Medical Record [id number] Patient Account Number: [id number] Date of Birth/Sex: Treating RN: Oct 15, 1951 (68 y.o. [name]) [name] Primary Care Gila Lauf: [name] Other Clinician: Referring Sneijder Bernards: Treating Keinan Brouillet/Extender:[name], Dema Severin, [name] in Treatment: 182 Wound Status Wound Number: [id number] Primary Lymphedema Etiology: Wound Location: Left Knee - Medial Wound Open Wounding Event: Blister Status: Date Acquired: 07/14/2019 Comorbid Chronic sinus problems/congestion, Weeks Of Treatment: 1 History: Arrhythmia, Hypertension, Peripheral Arterial Clustered  Wound: No Disease, Type II Diabetes, History of Burn, Gout, Confinement Anxiety Photos Wound Measurements Length: (cm) 0.2 % Reduc Width: (cm) 0.2 % Reduc Depth: (cm) 0.1 Epithel Area: (cm) 0.031 Tunnel Volume: (cm) 0.003 Underm Wound Description Full Thickness Without Exposed Support Foul O Classification: Structures Slough Wound Distinct, outline attached Margin: Exudate Medium Amount: Exudate Serosanguineous Type: Exudate red, brown Color: Wound Bed Granulation Amount: Large (67-100%) Granulation Quality: Red, Pink Fascia Necrotic Amount: None Present (0%) Fat Lay Tendon Muscle Joint E Bone Ex dor After Cleansing: No /Fibrino No Exposed Structure Exposed: No er (Subcutaneous Tissue) Exposed: Yes Exposed: No Exposed: No xposed: No posed: No tion in Area: 91.9% tion in Volume: 92.1% ialization: Small (1-33%) ing: No ining: No Electronic Signature(s) Signed: 07/22/2019 4:16:29 PM By: Mikeal Hawthorne EMT/HBOT Signed: 10/12/2019 3:04:22 PM By: [name] RN Entered By: Mikeal Hawthorne on 07/22/2019  10:38:13 -------------------------------------------------------------------------------- Wound Assessment Details Patient Name: Date of Service: [name]. 07/21/2019 12:45 PM Medical Record [id number] Patient Account Number: [id number] Date of Birth/Sex: Treating RN: [birth year] (68 y.o. [name]) [name] Primary Care Staley Lunz: [name] Other Clinician: Referring Shawanna Zanders: Treating Marytza Grandpre/Extender:[name], Dema Severin, [name] in Treatment: 182 Wound Status Wound Number: 134 Primary Lymphedema Etiology: Wound Location: Right Upper Leg - Medial Wound Open Wounding Event: Gradually Appeared Status: Date Acquired: 07/14/2019 Comorbid Chronic sinus problems/congestion, Weeks Of Treatment: 1 History: Arrhythmia, Hypertension, Peripheral Arterial Clustered Wound: No Disease, Type II Diabetes, History of Burn, Gout, Confinement Anxiety Photos Wound Measurements Length: (cm) 0.6 % Reduct Width: (cm) 2.1 % Reduct Depth: (cm) 0.1 Epitheli Area: (cm) 0.99 Tunneli Volume: (cm) 0.099 Undermi Wound Description Classification: Full Thickness Without Exposed Support Foul Od Structures Slough/ Wound Distinct, outline attached Margin: Exudate Medium Amount: Exudate Serosanguineous Type: Exudate red, brown Color: Wound Bed Granulation Amount: Large (67-100%) Granulation Quality: Red, Pink Fascia E Necrotic Amount: None Present (0%) Fat Laye Tendon E Muscle E Joint Ex Bone Exp or After Cleansing: No Fibrino No Exposed Structure xposed: No r (Subcutaneous Tissue) Exposed: No xposed: No xposed: No posed: No osed: No ion in Area: -75.2% ion in Volume: -73.7% alization: Small (1-33%) ng: No ning: No Electronic Signature(s) Signed: 07/22/2019 4:16:29 PM By: Mikeal Hawthorne EMT/HBOT Signed: 10/12/2019 3:04:22 PM By: [name] RN Entered By: Mikeal Hawthorne on 07/22/2019  10:35:27 -------------------------------------------------------------------------------- Vitals Details Patient Name: Date of Service: [name]. 07/21/2019 12:45 PM Medical Record [id number] Patient Account Number: [id number] Date of Birth/Sex: Treating RN: Jun 12, 1951 (68 y.o. [name]) Dolores Lory, [place] Primary Care Joliene Salvador: [name] Other Clinician: Referring Serenity Fortner: Treating Hill Mackie/Extender:[name], Dema Severin, [name] in Treatment: 182 Vital Signs Time Taken: 13:23 Temperature (F): 98 Height (in): 70 Pulse (bpm): 64 Weight (lbs): 380.2 Respiratory Rate (breaths/min): 20 Body Mass Index (BMI): 54.5 Blood Pressure (mmHg): 155/60 Reference Range: 80 - 120 mg / dl Electronic Signature(s) Signed: 10/12/2019 3:04:22 PM By: [name] RN Entered By: [name] on 07/21/2019 13:24:34

## 2019-10-12 NOTE — Progress Notes (Signed)
ERNST, CUMPSTON ([id number]) Visit Report for 07/23/2019 Arrival Information Details Patient Name: Date of Service: [name], [name] 07/23/2019 11:15 AM Medical Record [id number] Patient Account Number: [id number] Date of Birth/Sex: Treating RN: 01-25-1951 (68 y.o. [name] Primary Care Shaira Sova: [name] Other Clinician: Referring Nusaiba Guallpa: Treating Rio Kidane/Extender:Robson, Peyton Najjar, [name] in Treatment: 10 Visit Information History Since Last Visit Walker Added or deleted any medications: No Patient Arrived: Any new allergies or adverse reactions: No Arrival Time: 11:21 Had a fall or experienced change in No Accompanied By: self activities of daily living that may affect Transfer Assistance: None risk of falls: Patient Identification Verified: Yes Signs or symptoms of abuse/neglect since last No Secondary Verification Process Completed: Yes visito Patient Requires Transmission-Based No Hospitalized since last visit: No Precautions: Implantable device outside of the clinic excluding No Patient Has Alerts: Yes cellular tissue based products placed in the center since last visit: Has Dressing in Place as Prescribed: Yes Pain Present Now: No Electronic Signature(s) Signed: 10/12/2019 3:04:24 PM By: Sandre Kitty Entered By: Sandre Kitty on 07/23/2019 11:22:40 -------------------------------------------------------------------------------- Compression Therapy Details Patient Name: Date of Service: [name], Kevin 07/23/2019 11:15 AM Medical Record [id number] Patient Account Number: [id number] Date of Birth/Sex: Treating RN: [birth year] (68 y.o. [name] Primary Care Samaira Holzworth: [name] Other Clinician: Referring Quandarius Nill: Treating Tamantha Saline/Extender:Robson, Peyton Najjar, [name] in Treatment: 182 Compression Therapy Performed for Wound Wound #130 Left,Medial Lower Leg Assessment: Performed By:  Clinician Deon Pilling, RN Compression Type: Four Layer Electronic Signature(s) Signed: 07/23/2019 5:30:49 PM By: Deon Pilling Entered By: Deon Pilling on 07/23/2019 14:15:21 -------------------------------------------------------------------------------- Compression Therapy Details Patient Name: Date of Service: Kevin, [name] 07/23/2019 11:15 AM Medical Record [id number] Patient Account Number: [id number] Date of Birth/Sex: Treating RN: [birth year] (68 y.o. [name] Primary Care Porchea Charrier: [name] Other Clinician: Referring Wane Mollett: Treating Zaray Gatchel/Extender:Robson, Peyton Najjar, [name] in Treatment: 182 Compression Therapy Performed for Wound Wound #134 Right,Medial Upper Leg Assessment: Performed By: Clinician Deon Pilling, RN Compression Type: [name] Electronic Signature(s) Signed: 07/23/2019 5:30:49 PM By: Deon Pilling Entered By: Deon Pilling on 07/23/2019 14:15:38 -------------------------------------------------------------------------------- Encounter Discharge Information Details Patient Name: Date of Service: [name]. 07/23/2019 11:15 AM Medical Record [id number] Patient Account Number: [id number] Date of Birth/Sex: Treating RN: October 23, 1951 (68 y.o. [name] Primary Care Henslee Lottman: [name] Other Clinician: Referring Lavonn Maxcy: Treating Chanze Teagle/Extender:Robson, Peyton Najjar, [name] in Treatment: [phone] Encounter Discharge Information Items Discharge Condition: Stable Ambulatory Status: Walker Discharge Destination: Home Transportation: Private Auto Accompanied By: self Schedule Follow-up Appointment: Yes Clinical Summary of Care: Electronic Signature(s) Signed: 07/23/2019 5:30:49 PM By: Deon Pilling Entered By: Deon Pilling on 07/23/2019 14:19:05 -------------------------------------------------------------------------------- Patient/Caregiver Education Details Patient Name: Date of  Service: [name] 9/18/2020andnbsp11:15 AM Medical Record [id number] Patient Account Number: [id number] Date of Birth/Gender: Treating RN: 06/27/1951 (68 y.o. [name] Primary Care Physician: [name] Other Clinician: Referring Physician: Treating Physician/Extender:Robson, Peyton Najjar, [name] in Treatment: [id number] Education Assessment Education Provided To: Patient Education Topics Provided Wound/Skin Impairment: Handouts: Caring for Your Ulcer Methods: Explain/Verbal Responses: Reinforcements needed Electronic Signature(s) Signed: 07/23/2019 5:30:49 PM By: Deon Pilling Entered By: Deon Pilling on 07/23/2019 14:18:52 -------------------------------------------------------------------------------- Wound Assessment Details Patient Name: Date of Service: [name], [name] 07/23/2019 11:15 AM Medical Record [id number] Patient Account Number: [id number] Date of Birth/Sex: Treating RN: [birth year] (68 y.o. [name] Primary Care Binnie Droessler: [name] Other Clinician: Referring Gusta Marksberry: Treating Ling Flesch/Extender:Robson, Peyton Najjar, [name] in Treatment: 182 Wound Status Wound Number:  130 Primary Etiology: Lymphedema Wound Location: Left, Medial Lower Leg Wound Status: Open Wounding Event: Blister Date Acquired: 07/07/2019 Weeks Of Treatment: 2 Clustered Wound: No Wound Measurements Length: (cm) 5 Width: (cm) 5.5 Depth: (cm) 0.1 Area: (cm) 21.598 Volume: (cm) 2.16 Wound Description Classification: Partial Thickness % Reduction in Area: -1566.5% % Reduction in Volume: -1561.5% Electronic Signature(s) Signed: 07/23/2019 5:30:49 PM By: Shawn Stall Entered By: Shawn Stall on 07/23/2019 14:14:24 -------------------------------------------------------------------------------- Wound Assessment Details Patient Name: Date of Service: [name], [name] 07/23/2019 11:15 AM Medical Record [id number] Patient  Account Number: [id number] Date of Birth/Sex: Treating RN: 1951/02/17 (68 y.o. [name] Primary Care Zaylen Susman: Nicoletta Ba Other Clinician: Referring Zacharie Portner: Treating Jaxten Brosh/Extender:Robson, Lottie Rater, [name] in Treatment: 182 Wound Status Wound Number: 131 Primary Etiology: Lymphedema Wound Location: Left, Lateral Lower Leg Wound Status: Open Wounding Event: Blister Date Acquired: 07/07/2019 Weeks Of Treatment: 2 Clustered Wound: Yes Wound Measurements Length: (cm) 7.5 % Reduc Width: (cm) 13 % Reduc Depth: (cm) 0.1 Area: (cm) 76.576 Volume: (cm) 7.658 Wound Description Classification: Full Thickness Without Exposed Support Structures tion in Area: -13822.9% tion in Volume: -13823.6% Electronic Signature(s) Signed: 07/23/2019 5:30:49 PM By: Shawn Stall Entered By: Shawn Stall on 07/23/2019 14:14:24 -------------------------------------------------------------------------------- Wound Assessment Details Patient Name: Date of Service: [name], Henrichs 07/23/2019 11:15 AM Medical Record [id number] Patient Account Number: [id number] Date of Birth/Sex: Treating RN: [birth year] (68 y.o. [name] Primary Care Deborra Phegley: Nicoletta Ba Other Clinician: Referring Jamyiah Labella: Treating Korianna Washer/Extender:Robson, Lottie Rater, [name] in Treatment: 182 Wound Status Wound Number: 132 Primary Etiology: Lymphedema Wound Location: Left, Medial Upper Leg Wound Status: Open Wounding Event: Blister Date Acquired: 07/14/2019 Weeks Of Treatment: 1 Clustered Wound: No Wound Measurements Length: (cm) 2.5 % Reduc Width: (cm) 3 % Reduc Depth: (cm) 0.1 Area: (cm) 5.89 Volume: (cm) 0.589 Wound Description Classification: Full Thickness Without Exposed Support Structures tion in Area: -66.7% tion in Volume: -66.9% Electronic Signature(s) Signed: 07/23/2019 5:30:49 PM By: Shawn Stall Entered By: Shawn Stall on 07/23/2019  14:14:24 -------------------------------------------------------------------------------- Wound Assessment Details Patient Name: Date of Service: Dorean, [name] 07/23/2019 11:15 AM Medical Record [id number] Patient Account Number: [id number] Date of Birth/Sex: Treating RN: 12-15-1950 (68 y.o. [name] Primary Care Zander Ingham: Nicoletta Ba Other Clinician: Referring Lenus Trauger: Treating Kenora Spayd/Extender:Robson, Lottie Rater, [name] in Treatment: 182 Wound Status Wound Number: 133 Primary Etiology: Lymphedema Wound Location: Left, Medial Knee Wound Status: Open Wounding Event: Blister Date Acquired: 07/14/2019 Weeks Of Treatment: 1 Clustered Wound: No Wound Measurements Length: (cm) 0.2 % Reduc Width: (cm) 0.2 % Reduc Depth: (cm) 0.1 Area: (cm) 0.031 Volume: (cm) 0.003 Wound Description Full Thickness Without Exposed Support Classification: Structures tion in Area: 91.9% tion in Volume: 92.1% Electronic Signature(s) Signed: 07/23/2019 5:30:49 PM By: Shawn Stall Entered By: Shawn Stall on 07/23/2019 14:14:25 -------------------------------------------------------------------------------- Wound Assessment Details Patient Name: Date of Service: Winner, [name] 07/23/2019 11:15 AM Medical Record [id number] Patient Account Number: [id number] Date of Birth/Sex: Treating RN: 1951/06/27 (68 y.o. [name] Primary Care Fabiana Dromgoole: Nicoletta Ba Other Clinician: Referring Geena Weinhold: Treating Yuriko Portales/Extender:Robson, Lottie Rater, [name] in Treatment: 182 Wound Status Wound Number: 134 Primary Etiology: Lymphedema Wound Location: Right, Medial Upper Leg Wound Status: Open Wounding Event: Gradually Appeared Date Acquired: 07/14/2019 Weeks Of Treatment: 1 Clustered Wound: No Wound Measurements Length: (cm) 0.6 % Reduc Width: (cm) 2.1 % Reduc Depth: (cm) 0.1 Area: (cm) 0.99 Volume: (cm) 0.099 Wound Description Full  Thickness Without Exposed Support Classification: Structures tion in Area: -75.2% tion in Volume: -  73.7% Electronic Signature(s) Signed: 07/23/2019 5:30:49 PM By: Shawn Stalleaton, [name] Entered By: Shawn Stalleaton, [name] on 07/23/2019 14:14:25 -------------------------------------------------------------------------------- Vitals Details Patient Name: Date of Service: Alphonse Guildowper, Gurjit J. 07/23/2019 11:15 AM Medical Record [id number] Patient Account Number: [id number] Date of Birth/Sex: Treating RN: 02/28/1951 (68 y.o. Damaris SchoonerM) Boehlein, Linda Primary Care Vedanth Sirico: Nicoletta BaMCGOWEN, [name] Other Clinician: Referring Khamani Fairley: Treating Lonna Rabold/Extender:Robson, Lottie RaterMichael MCGOWEN, [name] in Treatment: 182 Vital Signs Time Taken: 11:23 Temperature (F): 97.9 Height (in): 70 Pulse (bpm): 49 Weight (lbs): 380.2 Respiratory Rate (breaths/min): 20 Body Mass Index (BMI): 54.5 Blood Pressure (mmHg): 132/63 Reference Range: 80 - 120 mg / dl Electronic Signature(s) Signed: 10/12/2019 3:04:24 PM By: Karl Itoawkins, Destiny Entered By: Karl Itoawkins, Destiny on 07/23/2019 11:24:21

## 2019-10-12 NOTE — Progress Notes (Signed)
[name], [name] ([id number]) Visit Report for 10/08/2019 Arrival Information Details Patient Name: Date of Service: [name], [name] 10/08/2019 10:30 AM Medical Record [id number] Patient Account Number: [id number] Date of Birth/Sex: Treating RN: 1951/01/27 (68 y.o. Judie Petit) Yevonne Pax Primary Care Arlis Everly: Nicoletta Ba Other Clinician: Referring Krystyn Picking: Treating Rohini Jaroszewski/Extender:Robson, Lottie Rater, [name] in Treatment: 193 Visit Information History Since Last Visit Walker All ordered tests and consults were completed: No Patient Arrived: Added or deleted any medications: No Arrival Time: 10:39 Any new allergies or adverse reactions: No Accompanied By: self Had a fall or experienced change in No Transfer Assistance: None activities of daily living that may affect Patient Identification Verified: Yes risk of falls: Secondary Verification Process Completed: Yes Signs or symptoms of abuse/neglect since last No Patient Requires Transmission-Based No visito Precautions: Hospitalized since last visit: No Patient Has Alerts: Yes Implantable device outside of the clinic excluding No cellular tissue based products placed in the center since last visit: Has Dressing in Place as Prescribed: Yes Has Compression in Place as Prescribed: Yes Pain Present Now: No Electronic Signature(s) Signed: 10/12/2019 2:52:36 PM By: Yevonne Pax RN Entered By: Yevonne Pax on 10/08/2019 10:40:23 -------------------------------------------------------------------------------- Compression Therapy Details Patient Name: Date of Service: [name], [name] 10/08/2019 10:30 AM Medical Record [id number] Patient Account Number: [id number] Date of Birth/Sex: Treating RN: 1951-07-04 (68 y.o. Judie Petit) Yevonne Pax Primary Care Perseus Westall: Nicoletta Ba Other Clinician: Referring Lukis Bunt: Treating Teondre Jarosz/Extender:Robson, Lottie Rater, [name] in Treatment: 193 Compression Therapy  Performed for Wound Wound #147 Left,Circumferential Lower Leg Assessment: Performed By: Little Ishikawa, RN Compression Type: Four Layer Electronic Signature(s) Signed: 10/12/2019 2:52:36 PM By: Yevonne Pax RN Entered By: Yevonne Pax on 10/08/2019 10:44:57 -------------------------------------------------------------------------------- Encounter Discharge Information Details Patient Name: Date of Service: [name] 10/08/2019 10:30 AM Medical Record [id number] Patient Account Number: [id number] Date of Birth/Sex: Treating RN: 03/28/1951 (68 y.o. [name] Primary Care Kellsey Sansone: Nicoletta Ba Other Clinician: Referring Ondrea Dow: Treating Xitlalic Maslin/Extender:Robson, Lottie Rater, [name] in Treatment: 193 Encounter Discharge Information Items Discharge Condition: Stable Ambulatory Status: Walker Discharge Destination: Home Transportation: Private Auto Accompanied By: self Schedule Follow-up Appointment: Yes Clinical Summary of Care: Electronic Signature(s) Signed: 10/08/2019 5:26:50 PM By: Shawn Stall Entered By: Shawn Stall on 10/08/2019 11:39:06 -------------------------------------------------------------------------------- Patient/Caregiver Education Details Patient Name: Date of Service: [name] 12/4/2020andnbsp10:30 AM Medical Record [id number] Patient Account Number: [id number] Date of Birth/Gender: Treating RN: 1951/10/17 (68 y.o. [name] Primary Care Physician: Nicoletta Ba Other Clinician: Referring Physician: Treating Physician/Extender:Robson, Lottie Rater, [name] in Treatment: 193 Education Assessment Education Provided To: Patient Education Topics Provided Wound/Skin Impairment: Handouts: Caring for Your Ulcer Methods: Explain/Verbal Responses: Reinforcements needed Electronic Signature(s) Signed: 10/08/2019 5:26:50 PM By: Shawn Stall Entered By: Shawn Stall on 10/08/2019  11:38:55 -------------------------------------------------------------------------------- Wound Assessment Details Patient Name: Date of Service: [name], [name] 10/08/2019 10:30 AM Medical Record [id number] Patient Account Number: [id number] Date of Birth/Sex: Treating RN: 1951-03-31 (68 y.o. Judie Petit) Yevonne Pax Primary Care Phinley Schall: Nicoletta Ba Other Clinician: Referring Antero Derosia: Treating Hopie Pellegrin/Extender:Robson, Lottie Rater, [name] in Treatment: 193 Wound Status Wound Number: 147 Primary Lymphedema Etiology: Wound Location: Left Lower Leg - Circumferential Wound Open Wounding Event: Blister Status: Date Acquired: 09/30/2019 Comorbid Chronic sinus problems/congestion, Weeks Of Treatment: 0 History: Arrhythmia, Hypertension, Peripheral Arterial Clustered Wound: No Disease, Type II Diabetes, History of Burn, Gout, Confinement Anxiety Wound Measurements Length: (cm) 3.4 % Reduc Width: (cm) 46 % Reduc Depth: (cm) 0.1 Epithel Area: (cm) 122.836 Tunnel Volume: (cm) 12.284 Underm Wound  Description Classification: Full Thickness Without Exposed Support Structures Wound Distinct, outline attached Margin: Exudate Large Amount: Exudate Serous Type: Exudate amber Color: Wound Bed Granulation Amount: Large (67-100%) Granulation Quality: Red, Pink Necrotic Amount: Small (1-33%) Necrotic Quality: Adherent Slough Foul Odor After Cleansing: No Slough/Fibrino Yes Exposed Structure Fascia Exposed: No Fat Layer (Subcutaneous Tissue) Exposed: Yes Tendon Exposed: No Muscle Exposed: No Joint Exposed: No Bone Exposed: No tion in Area: 51.4% tion in Volume: 51.4% ialization: None ing: No ining: No Electronic Signature(s) Signed: 10/12/2019 2:52:36 PM By: [name] RN Entered By: [name] on 10/08/2019 10:43:56 -------------------------------------------------------------------------------- Wound Assessment Details Patient Name: Date of  Service: [name], [name] 10/08/2019 10:30 AM Medical Record [id number] Patient Account Number: [id number] Date of Birth/Sex: Treating RN: [birth year] (68 y.o. [name]) [name] Primary Care Marlowe Lawes: [name] Other Clinician: Referring Jerlean Peralta: Treating Kalesha Irving/Extender:Robson, Peyton Najjar, [name] in Treatment: 193 Wound Status Wound Number: 149 Primary Lymphedema Etiology: Wound Location: Left Upper Leg - Medial Wound Open Wounding Event: Blister Status: Date Acquired: 10/04/2019 Comorbid Chronic sinus problems/congestion, Weeks Of Treatment: 0 History: Arrhythmia, Hypertension, Peripheral Arterial Clustered Wound: No Disease, Type II Diabetes, History of Burn, Gout, Confinement Anxiety Wound Measurements Length: (cm) 6 % Reduc Width: (cm) 7.5 % Reduc Depth: (cm) 0.1 Epithel Area: (cm) 35.343 Tunnel Volume: (cm) 3.534 Underm Wound Description Classification: Full Thickness Without Exposed Support Foul O Structures Slough Wound Flat and Intact Margin: Exudate Medium Amount: Exudate Serosanguineous Type: Exudate red, brown Color: Wound Bed Granulation Amount: Medium (34-66%) Granulation Quality: Red Fascia Necrotic Amount: Medium (34-66%) Fat Lay Necrotic Quality: Adherent Slough Tendon Muscle Joint Expose Bone Exposed dor After Cleansing: No /Fibrino Yes Exposed Structure Exposed: No er (Subcutaneous Tissue) Exposed: Yes Exposed: No Exposed: No d: No : No tion in Area: 0% tion in Volume: 0% ialization: None ing: No ining: No Electronic Signature(s) Signed: 10/12/2019 2:52:36 PM By: [name] RN Entered By: [name] on 10/08/2019 10:44:23 -------------------------------------------------------------------------------- Vitals Details Patient Name: Date of Service: [name] 10/08/2019 10:30 AM Medical Record [id number] Patient Account Number: [id number] Date of Birth/Sex: Treating RN: 1951/10/16 (68 y.o.  [name]) [name] Primary Care Kathryne Ramella: [name] Other Clinician: Referring Shakeel Disney: Treating Lebron Nauert/Extender:Robson, Peyton Najjar, [name] in Treatment: 193 Vital Signs Time Taken: 10:40 Temperature (F): 97.7 Height (in): 70 Pulse (bpm): 60 Weight (lbs): 380.2 Respiratory Rate (breaths/min): 18 Body Mass Index (BMI): 54.5 Blood Pressure (mmHg): 166/74 Reference Range: 80 - 120 mg / dl Electronic Signature(s) Signed: 10/12/2019 2:52:36 PM By: [name] RN Entered By: [name] on 10/08/2019 10:41:10

## 2019-10-12 NOTE — Progress Notes (Signed)
[name], [name] ([id number]) Visit Report for 08/19/2019 SuperBill Details Patient Name: Date of Service: [name], [name] 08/19/2019 Medical Record [id number] Patient Account Number: [id number] Date of Birth/Sex: Treating RN: 1951-06-02 (68 y.o. 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

## 2019-10-12 NOTE — Progress Notes (Signed)
ORPHEUS, HAYHURST ([id number]) Visit Report for 09/14/2019 HPI Details Patient Name: Date of Service: Kevin, [name] 09/14/2019 10:00 AM Medical Record [id number] Patient Account Number: [id number] Date of Birth/Sex: Treating RN: 1951-08-05 (68 y.o. [name]) Yevonne Pax Primary Care Provider: Nicoletta Ba Other Clinician: Referring Provider: Treating Provider/Extender:Robson, Lottie Rater, [name] in Treatment: 190 History of Present Illness HPI Description: Referred by PCP for consultation. Patient has long standing history of BLE venous stasis, no prior ulcerations. At beginning of month, developed cellulitis and weeping. Received IM Rocephin followed by Keflex and resolved. Wears compression stocking, appr 6 months old. Not sure strength. No present drainage. 01/22/16 this is a patient who is a type II diabetic on insulin. 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 He tells me he religiously wears pressure stockings of uncertain strength. He was here with weeping edema about 8 months ago but did not have an open wound. Roughly a month ago he had a reopening on his bilateral legs. He is been using bandages and Neosporin. He does not complain of pain. 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 Last BUN/creatinine I have is from 11/20/15 at 13 and 1.0 respectively 01/29/16; patient arrives today having tolerated the Profore wrap. He brought in his stockings and these are 18 mmHg stockings he bought from [place]. The compression here is likely inadequate. He does not complain of pain or excessive drainage she has no systemic symptoms. The wound on the right looks improved as does the one on the left although one on the left is more substantial with still tissue at risk below the actual wound area on the bilateral posterior calf 02/05/16;  patient arrives with poor edema control. He states that we did put a 4 layer compression on it last week. No weight appear 5 this. 02/12/16; the area on the posterior right Has healed. The left Has a substantial wound that has necrotic surface eschar that requires a debridement with a curette. 02/16/16;the patient called or a Nurse visit secondary to increased swelling. He had been in earlier in the week with his right leg healed. He was transitioned to is on pressure stocking on the right leg with the only open wound on the left, a substantial area on the left posterior calf. Note he has a history of severe lower extremity edema, he has a history of chronic atrial fibrillation but not heart failure per my notes but I'll need to research this. He is not complaining of chest pain shortness of breath or orthopnea. The intake nurse noted blisters on the previously closed right leg 02/19/16; this is the patient's regular visit day. I see him on Friday with escalating edema new wounds on the right leg and clear signs of at least right ventricular heart failure. I increased his Lasix to 40 twice a day. He is returning currently in follow-up. States he is noticed a decrease in that the edema 02/26/16 patient's legs have much less edema. There is nothing really open on the right leg. The left leg has improved condition of the large superficial wound on the posterior left leg 03/04/16; edema control is very much better. The patient's right leg wounds have healed. On the left leg he continues to have severe venous inflammation on the posterior aspect of the left leg. There is no tenderness and I don't think any of this is cellulitis.  03/11/16; patient's right leg is married healed and he is in his own stocking. The patient's left leg has deteriorated somewhat. There is a lot of erythema around the wound on the posterior left leg. There is also a significant rim of erythema posteriorly just above where the wrap  would've ended there is a new wound in this location and a lot of tenderness. Can't rule out cellulitis in this area. 03/15/16; patient's right leg remains healed and he is in his own stocking. The patient's left leg is much better than last review. His major wound on the posterior aspect of his left Is almost fully epithelialized. He has 3 small injuries from the wraps. Really. Erythema seems a lot better on antibiotics 03/18/16; right leg remains healed and he is in his own stocking. The patient's left leg is much better. The area on the posterior aspect of the left calf is fully epithelialized. His 3 small injuries which were wrap injuries on the left are improved only one seems still open his erythema has resolved 03/25/16; patient's right leg remains healed and he is in his own stocking. There is no open area today on the left leg posterior leg is completely closed up. His wrap injuries at the superior aspect of his leg are also resolved. He looks as though he has some irritation on the dorsal ankle but this is fully epithelialized without evidence of infection. 03/28/16; we discharged this patient on Monday. Transitioned him into his own stocking. There were problems almost immediately with uncontrolled swelling weeping edema multiple some of which have opened. He does not feel systemically unwell in particular no chest pain no shortness of breath and he does not feel 04/08/16; the edema is under better control with the Profore light wrap but he still has pitting edema. There is one large wound anteriorly 2 on the medial aspect of his left leg and 3 small areas on the superior posterior calf. Drainage is not excessive he is tolerating a Profore light well 04/15/16; put a Profore wrap on him last week. This is controlled is edema however he had a lot of pain on his left anterior foot most of his wounds are healed 04/22/16 once again the patient has denuded areas on the left anterior foot which he  states are because his wrap slips up word. He saw his primary physician today is on Lasix 40 twice a day and states that he his weight is down 20 pounds over the last 3 months. 04/29/16: Much improved. left anterior foot much improved. He is now on Lasix 80 mg per day. Much improved edema control 05/06/16; I was hoping to be able to discharge him today however once again he has blisters at a low level of where the compression was placed last week mostly on his left lateral but also his left medial leg and a small area on the anterior part of the left foot. 05/09/16; apparently the patient went home after his appointment on 7/4 later in the evening developing pain in his upper medial thigh together with subjective fever and chills although his temperature was not taken. The pain was so intense he felt he would probably have to call 911. However he then remembered that he had leftover doxycycline from a previous round of antibiotics and took these. By the next morning he felt a lot better. He called and spoke to one of our nurses and I approved doxycycline over the phone thinking that this was in relation to the  wounds we had previously seen although they were definitely were not. The patient feels a lot better old fever no chills he is still working. Blood sugars are reasonably controlled 05/13/16; patient is back in for review of his cellulitis on his anterior medial upper thigh. He is taking doxycycline this is a lot better. Culture I did of the nodular area on the dorsal aspect of his foot grew MRSA this also looks a lot better. 05/20/16; the patient is cellulitis on the medial upper thigh has resolved. All of his wound areas including the left anterior foot, areas on the medial aspect of the left calf and the lateral aspect of the calf at all resolved. He has a new blister on the left dorsal foot at the level of the fourth toe this was excised. No evidence of infection 05/27/16; patient continues to  complain weeping edema. He has new blisterlike wounds on the left anterior lateral and posterior lateral calf at the top of his wrap levels. The area on his left anterior foot appears better. He is not complaining of fever, pain or pruritus in his feet. 05/30/16; the patient's blisters on his left anterior leg posterior calf all look improved. He did not increase the Lasix 100 mg as I suggested because he was going to run out of his 40 mg tablets. He is still having weeping edema of his toes 06/03/16; I renewed his Lasix at 80 mg once a day as he was about to run out when I last saw him. He is on 80 mg of Lasix now. I have asked him to cut down on the excessive amount of water he was drinking and asked him to drink according to his thirst mechanisms 06/12/2016 -- was seen 2 days ago and was supposed to wear his compression stockings at home but he is developed lymphedema and superficial blisters on the left lower extremity and hence came in for a review 06/24/16; the remaining wound is on his left anterior leg. He still has edema coming from between his toes. There is lymphedema here however his edema is generally better than when I last saw this. He has a history of atrial fibrillation but does not have a known history of congestive heart failure nevertheless I think he probably has this at least on a diastolic basis. 07/01/16 I reviewed his echocardiogram from January 2017. This was essentially normal. He did not have LVH, EF of 55-60%. His right ventricular function was normal although he did have trivial tricuspid and pulmonic regurgitation. This is not audible on exam however. I increased his Lasix to do massive edema in his legs well above his knees I think in early July. He was also drinking an excessive amount of water at the time. 07/15/16; missed his appointment last week because of the Labor Day holiday on Monday. He could not get another appointment later in the week. Started to feel the  wrap digging in superiorly so we remove the top half and the bottom half of his wrap. He has extensive erythema and blistering superiorly in the left leg. Very tender. Very swollen. Edema in his foot with leaking edema fluid. He has not been systemically unwell 07/22/16; the area on the left leg laterally required some debridement. The medial wounds look more stable. His wrap injury wounds appear to have healed. Edema and his foot is better, weeping edema is also better. He tells me he is meeting with the supplier of the external compression pumps at work 08/05/16;  the patient was on vacation last week in [facility]. His wrap is been on for an extended period of time. Also over the weekend he developed an extensive area of tender erythema across his anterior medial thigh. He took to doxycycline yesterday that he had leftover from a previous prescription. The patient complains of weeping edema coming out of his toes 08/08/16; I saw this patient on 10/2. He was tender across his anterior thigh. I put him on doxycycline. He returns today in follow-up. He does not have any open wounds on his lower leg, he still has edema weeping into his toes. 08/12/16; patient was seen back urgently today to follow-up for his extensive left thigh cellulitis/erysipelas. He comes back with a lot less swelling and erythema pain is much better. I believe I gave him Augmentin and Cipro. His wrap was cut down as he stated a roll down his legs. He developed blistering above the level of the wrap that remained. He has 2 open blisters and 1 intact. 08/19/16; patient is been doing his primary doctor who is increased his Lasix from 40-80 once a day or 80 already has less edema. Cellulitis has remained improved in the left thigh. 2 open areas on the posterior left calf 08/26/16; he returns today having new open blisters on the anterior part of his left leg. He has his compression pumps but is not yet been shown how to use some  vital representative from the supplier. 09/02/16 patient returns today with no open wounds on the left leg. Some maceration in his plantar toes 09/10/2016 -- Dr. Leanord Hawking had recently discharged him on 09/02/2016 and he has come right back with redness swelling and some open ulcers on his left lower extremity. He says this was caused by trying to apply his compression stockings and he's been unable to use this and has not been able to use his lymphedema pumps. He had some doxycycline leftover and he has started on this a few days ago. 09/16/16; there are no open wounds on his leg on the left and no evidence of cellulitis. 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 He does not complain of symptoms here. I am not clear use using his external compression pumps. 09/23/16; I have not seen this patient in 2 weeks. He canceled his appointment 10 days ago as he was going on vacation. He tells me that on Monday he noticed a large area on his posterior left leg which is been draining copiously and is reopened into a large wound. He is been using ABDs and the external part of his juxtalite, according to our nurse this was not on properly. 10/07/16; Still a substantial area on the posterior left leg. Using silver alginate 10/14/16; in general better although there is still open area which looks healthy. Still using silver alginate. He reminds me that this happen before he left for [facility]. Today while he was showering in the morning. He had been using his juxtalite's 10/21/16; the area on his posterior left leg is fully epithelialized. However he arrives today with a large area of tender erythema in his medial and posterior left thigh just above the knee. I have marked the area. Once again he is reluctant to consider hospitalization. I treated him with oral antibiotics in the past for a similar situation with resolution I think with doxycycline  however this area it seems more extensive to me. He is not complaining of fever  but does have chills and says states he is thirsty. His blood sugar today was in the 140s at home 10/25/16 the area on his posterior left leg is fully epithelialized although there is still some weeping edema. The large area of tenderness and erythema in his medial and posterior left thigh is a lot less tender although there is still a lot of swelling in this thigh. He states he feels a lot better. He is on doxycycline and Augmentin that I started last week. This will continued until Tuesday, December 26. I have ordered a duplex ultrasound of the left thigh rule out DVT whether there is an abscess something that would need to be drained I would also like to know. 11/01/16; he still has weeping edema from a not fully epithelialized area on his left posterior calf. Most of the rest of this looks a lot better. He has completed his antibiotics. His thigh is a lot better. Duplex ultrasound did not show a DVT in the thigh 11/08/16; he comes in today with more Denuded surface epithelium from the posterior aspect of his calf. There is no real evidence of cellulitis. The superior aspect of his wrap appears to have put quite an indentation in his leg just below the knee and this may have contributed. He does not complain of pain or fever. We have been using silver alginate as the primary dressing. The area of cellulitis in the right thigh has totally resolved. He has been using his compression stockings once a week 11/15/16; the patient arrives today with more loss of epithelium from the posterior aspect of his left calf. He now has a fairly substantial wound in this area. The reason behind this deterioration isn't exactly clear although his edema is not well controlled. He states he feels he is generally more swollen systemically. He is not complaining of chest pain shortness of breath fever. Tells me he has an appointment with his  primary physician in early February. He is on 80 mg of oral Lasix a day. He claims compliance with the external compression pumps. He is not having any pain in his legs similar to what he has with his recurrent cellulitis 11/22/16; the patient arrives a follow-up of his large area on his left lateral calf. This looks somewhat better today. He came in earlier in the week for a dressing change since I saw him a week ago. He is not complaining of any pain no shortness of breath no chest pain 11/28/16; the patient arrives for follow-up of his large area on the left lateral calf this does not look better. In fact it is larger weeping edema. The surface of the wound does not look too bad. We have been using silver alginate although I'm not certain that this is a dressing issue. 12/05/16; again the patient follows up for a large wound on the left lateral and left posterior calf this does not look better. There continues to be weeping edema necrotic surface tissue. More worrisome than this once again there is erythema below the wound involving the distal Achilles and heel suggestive of cellulitis. He is on his feet working most of the day of this is not going well. We are changing his dressing twice a week to facilitate the drainage. 12/12/16; not much change in the overall dimensions of the large area on the left posterior calf. This is very inflamed looking. I gave him an. Doxycycline last week does not really seem to have helped. He found the wrap  very painful indeed it seems to of dog into his legs superiorly and perhaps around the heel. He came in early today because the drainage had soaked through his dressings. 12/19/16- patient arrives for follow-up evaluation of his left lower extremity ulcers. He states that he is using his lymphedema pumps once daily when there is "no drainage". He admits to not using his lipedema pumps while under current treatment. His blood sugars have been consistently between  150-200. 12/26/16; the patient is not using his compression pumps at home because of the wetness on his feet. I've advised him that I think it's important for him to use this daily. He finds his feet too wet, he can put a plastic bag over his legs while he is in the pumps. Otherwise I think will be in a vicious circle. We are using silver alginate to the major area on his left posterior calf 01/02/17; the patient's posterior left leg has further of all into 3 open wounds. All of them covered with a necrotic surface. He claims to be using his compression pumps once a day. His edema control is marginal. Continue with silver alginate 01/10/17; the patient's left posterior leg actually looks somewhat better. There is less edema, less erythema. Still has 3 open areas covered with a necrotic surface requiring debridement. He claims to be using his compression pumps once a day his edema control is better 01/17/17; the patient's left posterior calf look better last week when I saw him and his wrap was changed 2 days ago. He has noted increasing pain in the left heel and arrives today with much larger wounds extensive erythema extending down into the entire heel area especially tender medially. He is not systemically unwell CBGs have been controlled no fever. Our intake nurse showed me limegreen drainage on his AVD pads. 01/24/17; his usual this patient responds nicely to antibiotics last week giving him Levaquin for presumed Pseudomonas. The whole entire posterior part of his leg is much better much less inflamed and in the case of his Achilles heel area much less tender. He has also had some epithelialization posteriorly there are still open areas here and still draining but overall considerably better 01/31/17- He has continue to tolerate the compression wraps. he states that he continues to use the lymphedema pumps daily, and can increase to twice daily on the weekends. He is voicing no complaints or concerns  regarding his LLE ulcers 02/07/17-he is here for follow-up evaluation. He states that he noted some erythema to the left medial and anterior thigh, which he states is new as of yesterday. He is concerned about recurrent cellulitis. He states his blood sugars have been slightly elevated, this morning in the 180s 02/14/17; he is here for follow-up evaluation. When he was last here there was erythema superiorly from his posterior wound in his anterior thigh. He was prescribed Levaquin however a culture of the wound surface grew MRSA over the phone I changed him to doxycycline on Monday and things seem to be a lot better. 02/24/17; patient missed his appointment on Friday therefore we changed his nurse visit into a physician visit today. Still using silver alginate on the large area of the posterior left thigh. He isn't new area on the dorsal left second toe 03/03/17; actually better today although he admits he has not used his external compression pumps in the last 2 days or so because of work responsibilities over the weekend. 03/10/17; continued improvement. External compression pumps once a day almost  all of his wounds have closed on the posterior left calf. Better edema control 03/17/17; in general improved. He still has 3 small open areas on the lateral aspect of his left leg however most of the area on the posterior part of his leg is epithelialized. He has better edema control. He has an ABD pad under his stocking on the right anterior lower leg although he did not let us look at that today. 03/24/17; patient arrives back in clinic today with no open areas however there are areas on the posterior left calf and anterior left calf that are less than 100% epithelialized. His edema is well controlled in the left lower leg. There is some pitting edema probably lymphedema in the left upper thigh. He uses compression pumps at home once per day. I tried to get him to do this twice a day although he is very  reticent. 04/01/2017 -- for the last 2 days he's had significant redness, tenderness and weeping and came in for an urgent visit today. 04/07/17; patient still has 6 more days of doxycycline. He was seen by Dr. Con Memos last Wednesday for cellulitis involving the posterior aspect, lateral aspect of his Involving his heel. For the most part he is better there is less erythema and less weeping. He has been on his feet for 12 hours 2 over the weekend. Using his compression pumps once a day 04/14/17 arrives today with continued improvement. Only one area on the posterior left calf that is not fully epithelialized. He has intense bilateral venous inflammation associated with his chronic venous insufficiency disease and secondary lymphedema. We have been using silver alginate to the left posterior calf wound In passing he tells [place] today that the right leg but we have not seen in quite some time has an open area on it but he doesn't want [place] to look at this today states he will show this to [place] next week. 04/21/17; there is no open area on his left leg although he still reports some weeping edema. He showed [place] his right leg today which is the first time we've seen this leg in a long time. He has a large area of open wound on the right leg anteriorly healthy granulation. Quite a bit of swelling in the right leg and some degree of venous inflammation. He told us about the right leg in passing last week but states that deterioration in the right leg really only happened over the weekend 04/28/17; there is no open area on the left leg although there is an irritated part on the posterior which is like a wrap injury. The wound on the right leg which was new from last week at least to [place] is a lot better. 05/05/17; still no open area on the left leg. Patient is using his new compression stocking which seems to be doing a good job of controlling the edema. He states he is using his compression pumps once per day. The  right leg still has an open wound although it is better in terms of surface area. Required debridement. A lot of pain in the posterior right Achilles marked tenderness. Usually this type of presentation this patient gives concern for an active cellulitis 05/12/17; patient arrives today with his major wound from last week on the right lateral leg somewhat better. Still requiring debridement. He was using his compression stocking on the left leg however that is reopened with superficial wounds anteriorly he did not have an open wound on this leg  previously. He is still using his juxta light's once daily at night. He cannot find the time to do this in the morning as he has to be at work by 7 AM 05/19/17; right lateral leg wound looks improved. No debridement required. The concerning area is on the left posterior leg which appears to almost have a subcutaneous hemorrhagic component to it. We've been using silver alginate to all the wounds 05/26/17; the right lateral leg wound continues to look improved. However the area on the left posterior calf is a tightly adherent surface. [place] using silver alginate. Because of the weeping edema in his legs there is very little good alternatives. 06/02/17; the patient left here last week looking quite good. Major wound on the left posterior calf and a small one on the right lateral calf. Both of these look satisfactory. He tells me that by Wednesday he had noted increased pain in the left leg and drainage. He called on Thursday and Friday to get an appointment here but we were blocked. He did not go to urgent care or his primary physician. He thinks he had a fever on Thursday but did not actually take his temperature. He has not been using his compression pumps on the left leg because of pain. I advised him to go to the emergency room today for IV antibiotics for stents of left leg cellulitis but he has refused I have asked him to take 2 days off work to keep his  leg elevated and he has refused this as well. In view of this I'm going to call him and Augmentin and doxycycline. 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 However after a detailed discussion regarding the risk benefits and alternatives he refuses to get admitted to the hospital. With no other recourse I will continue him on oral antibiotics as before and hopefully he'll have his infectious disease consultation this week. 06/16/2017 -- the patient was seen today by the nurse practitioner at infectious disease Ms. Dixon. 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 He has also been advise Lotrimin cream locally. she also had wise IV antibiotics for his condition if it flares up 06/23/17; patient arrives today with drainage bilaterally although the remaining wound on the left posterior calf after cleaning up today "highlighter yellow drainage" did not look too bad. Unfortunately he has had breakdown on the right anterior leg [previously this leg had not been open and he is using a black stocking] he went to see infectious disease and is been put on clindamycin 150 mg daily, I did not verify the dose although I'm not familiar with using clindamycin in this dosing range, perhaps for prophylaxisoo 06/27/17; I brought this patient back today to follow-up on the wound deterioration on the right lower leg together with surrounding cellulitis. I started him on doxycycline 4 days ago. This area looks better however he comes in today with intense cellulitis on the medial part of his left  thigh. This is not have a wound in this area. Extremely tender. We've been using silver alginate to  the wounds on the right lower leg left lower leg with bilateral 4 layer compression he is using his external compression pumps once a day 07/04/17; patient's left medial thigh cellulitis looks better. He has not been using his compression pumps as his insert said it was contraindicated with cellulitis. His right leg continues to make improvements all the wounds are still open. We only have one remaining wound on the left posterior calf. Using silver alginate to all open areas. He is on doxycycline which I started a week ago and should be finishing I gave him Augmentin after Thursday's visit for the severe cellulitis on the left medial thigh which fortunately looks better 07/14/17; the patient's left medial thigh cellulitis has resolved. The cellulitis in his right lower calf on the right also looks better. All of his wounds are stable to improved we've been using silver alginate he has completed the antibiotics I have given him. He has clindamycin 150 mg once a day prescribed by infectious disease for prophylaxis, I've advised him to start this now. We have been using bilateral Unna boots over silver alginate to the wound areas 07/21/17; the patient is been to see infectious disease who noted his recurrent problems with cellulitis. He was not able to tolerate prophylactic clindamycin therefore he is on amoxicillin 500 twice a day. He also had a second daily dose of Lasix added By Dr. Oneta Rack but he is not taking this. Nor is he being completely compliant with his compression pumps a especially not this week. He has 2 remaining wounds one on the right posterior lateral lower leg and one on the left posterior medial lower leg. 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 The patient has Unna boots bilaterally. Still wounds on his  right lateral, left medial, and a new open area on the left anterior lateral lower leg 08/04/17; he remains on amoxicillin twice a day for prophylaxis of recurrent cellulitis. He has bilateral Unna boots for compression and silver alginate to his wounds. Arrives today with his legs looking as good as I have seen him in quite some time. Not surprisingly his wounds look better as well with improvement on the right lateral leg venous insufficiency wound and also the left medial leg. He is still using the compression pumps once a day 08/11/17; both legs appear to be doing better wounds on the right lateral and left medial legs look better. Skin on the right leg quite good. He is been using silver alginate as the primary dressing. I'm going to use Anasept gel calcium alginate and maintain all the secondary dressings 08/18/17; the patient continues to actually do quite well. The area on his right lateral leg is just about closed the left medial also looks better although it is still moist in this area. His edema is well controlled we have been using Anasept gel with calcium alginate and the usual secondary dressings, 4 layer compression and once daily use of his compression pumps "always been able to manage 09/01/17; the patient continues to do reasonably well in spite of his trip to [place]. The area on the right lateral leg is epithelialized. Left is much better but still open. He has more edema and more chronic erythema on the left leg [venous inflammation] 09/08/17; he arrives today with no open wound on the right lateral leg and decently controlled edema. Unfortunately his left leg is not nearly as in his good situation as last  week.he apparently had increasing edema starting on Saturday. He edema soaked through into his foot so used a plastic bag to walk around his home. The area on the medial right leg which was his open area is about the same however he has lost surface epithelium on the  left lateral which is new and he has significant pain in the Achilles area of the left foot. He is already on amoxicillin chronically for prophylaxis of cellulitis in the left leg 09/15/17; he is completed a week of doxycycline and the cellulitis in the left posterior leg and Achilles area is as usual improved. He still has a lot of edema and fluid soaking through his dressings. There is no open wound on the right leg. He saw infectious disease NP today 09/22/17;As usual 1 we transition him from our compression wraps to his stockings things did not go well. He has several small open areas on the right leg. He states this was caused by the compression wrap on his skin although he did not wear this with the stockings over them. He has several superficial areas on the left leg medially laterally posteriorly. He does not have any evidence of active cellulitis especially involving the left Achilles The patient is traveling [facility] Saturday going to [facility]. He states he isn't attempting to get an appointment with a heel objects wound center there to change his dressings. I am not completely certain whether this will work 10/06/17; the patient came in on Friday for a nurse visit and the nurse reported that his legs actually look quite good. He arrives in clinic today for his regular follow-up visit. He has a new wound on his left third toe over the PIP probably caused by friction with his footwear. He has small areas on the left leg and a very superficial but epithelialized area on the right anterior lateral lower leg. Other than that his legs look as good as I've seen him in quite some time. We have been using silver alginate Review of systems; no chest pain no shortness of breath other than this a 10 point review of systems negative 10/20/17; seen by Dr. Meyer Russel last week. He had taken some antibiotics [doxycycline] that he had left over. Dr. Meyer Russel thought he had candida infection and  declined to give him further antibiotics. He has a small wound remaining on the right lateral leg several areas on the left leg including a larger area on the left posterior several left medial and anterior and a small wound on the left lateral. The area on the left dorsal third toe looks a lot better. ROS; Gen.; no fever, respiratory no cough no sputum Cardiac no chest pain other than this 10 point review of system is negative 10/30/17; patient arrives today having fallen in the bathtub 3 days ago. It took him a while to get up. He has pain and maceration in the wounds on his left leg which have deteriorated. He has not been using his pumps he also has some maceration on the right lateral leg. 11/03/17; patient continues to have weeping edema especially in the left leg. This saturates his dressings which were just put on on 12/27. As usual the doxycycline seems to take care of the cellulitis on his lower leg. He is not complaining of fever, chills, or other systemic symptoms. He states his leg feels a lot better on the doxycycline I gave him empirically. He also apparently gets injections at his primary doctor's officeo Rocephin for  cellulitis prophylaxis. I didn't ask him about his compression pump compliance today I think that's probably marginal. Arrives in the clinic with all of his dressings primary and secondary macerated full of fluid and he has bilateral edema 11/10/17; the patient's right leg looks some better although there is still a cluster of wounds on the right lateral. The left leg is inflamed with almost circumferential skin loss medially to laterally although we are still maintaining anteriorly. He does not have overt cellulitis there is a lot of drainage. He is not using compression pumps. We have been using silver alginate to the wound areas, there are not a lot of options here 11/17/17; the patient's right leg continues to be stable although there is still open wounds, better than  last week. The inflammation in the left leg is better. Still loss of surface layer epithelium especially posteriorly. There is no overt cellulitis in the amount of edema and his left leg is really quite good, tells me he is using his compression pumps once a day. 11/24/17; patient's right leg has a small superficial wound laterally this continues to improve. The inflammation in the left leg is still improving however we have continuous surface layer epithelial loss posteriorly. There is no overt cellulitis in the amount of edema in both legs is really quite good. He states he is using his compression pumps on the left leg once a day for 5 out of 7 days 12/01/17; very small superficial areas on the right lateral leg continue to improve. Edema control in both legs is better today. He has continued loss of surface epithelialization and left posterior calf although I think this is better. We have been using silver alginate with large number of absorptive secondary dressings 4 layer on the left Unna boot on the right at his request. He tells me he is using his compression pumps once a day 12/08/17; he has no open area on the right leg is edema control is good here. On the left leg however he has marked erythema and tenderness breakdown of skin. He has what appears to be a wrap injury just distal to the popliteal fossa. 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 There was swelling erythema and breakdown in the right leg. He has much worse skin breakdown in the left leg as well multiple open areas medially and posteriorly as well as laterally. He tells me he has been using his compression pumps but tells me he feels that the  drainage out of his leg is worse when he uses a compression pumps. To be fair to him he is been saying this for a while however I don't know that I have really been listening to this. I wonder if the compression pumps are working properly 12/22/17;. Once again he arrives with severe erythema, weeping edema from the left greater than right leg. Noncompliance with compression pumps. New this visit he is complaining of pain on the lateral aspect of the right leg and the medial aspect of his right thigh. He apparently saw his cardiologist Dr. [name] who was ordered an echocardiogram area and I think this is a step in the right direction 12/25/17; started his doxycycline Monday night. There is still intense erythema  of the right leg especially in the anterior thigh although there is less tenderness. The erythema around the wound on the right lateral calf also is less tender. He still complaining of pain in the left heel. His wounds are about the same right lateral left medial left lateral. Superficial but certainly not close to closure. He denies being systemically unwell no fever chills no abdominal pain no diarrhea 12/29/17; back in follow-up of his extensive right calf and right thigh cellulitis. I added amoxicillin to cover possible doxycycline resistant strep. This seems to of done the trick he is in much less pain there is much less erythema and swelling. He has his echocardiogram at 11:00 this morning. X-ray of the left heel was also negative. 01/05/18; the patient arrived with his edema under much better control. Now that he is retired he is able to use his compression pumps daily and sometimes twice a day per the patient. He has a wound on the right leg the lateral wound looks better. Area on the left leg also looks a lot better. He has no evidence of cellulitis in his bilateral thighs I had a quick peak at his echocardiogram. He is in normal ejection fraction and normal left ventricular function.  He has moderate pulmonary hypertension moderately reduced right ventricular function. One would have to wonder about chronic sleep apnea although he says he doesn't snore. He'll review the echocardiogram with his cardiologist. 01/12/18; the patient arrives with the edema in both legs under exemplary control. He is using his compression pumps daily and sometimes twice daily. His wound on the right lateral leg is just about closed. He still has some weeping areas on the posterior left calf and lateral left calf although everything is just about closed here as well. I have spoken with Aldean Baker who is the patient's nurse practitioner and infectious disease. She was concerned that the patient had not understood that the parenteral penicillin injections he was receiving for cellulitis prophylaxis was actually benefiting him. I don't think the patient actually saw that I would tend to agree we were certainly dealing with less infections although he had a serious one last month. 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 He is healed. He'll be placed in juxtalite compression wraps and increase his lymphedema pumps to twice daily. We will follow up again next week to ensure there are no issues with the new regiment. 01/20/18-he is here for evaluation of bilateral lower extremity weeping edema. Yesterday he was placed in compression wrap to the right lower extremity and compression stocking to left lower shrubbery. He states he uses lymphedema pumps last night and again this morning and noted a blister to the left lower extremity. On exam he was noted to have drainage to the right lower extremity. He will be placed in Unna boots bilaterally and follow-up next week 01/26/18; patient was actually discharged a week ago to his own juxta light stockings only to return the next day with bilateral lower extremity weeping edema.he was placed in bilateral Unna boots. He  arrives today with pain in the back of his left leg. There is no open area on the right leg however there is a linear/wrap injury on the left leg and weeping edema on the left leg posteriorly. I spoke with infectious disease about 10 days ago. They were disappointed that the patient elected to discontinue prophylactic intramuscular penicillin shots as they felt it was particularly beneficial in reducing the frequency of  his cellulitis. I discussed this with the patient today. He does not share this view. He'll definitely need antibiotics today. Finally he is traveling to [place] and trauma leaving this Saturday and returning a week later and he does not travel with his pumps. He is going by car 01/30/18; patient was seen 4 days ago and brought back in today for review of cellulitis in the left leg posteriorly. I put him on amoxicillin this really hasn't helped as much as I might like. He is also worried because he is traveling to [facility] trauma by car. Finally we will be rewrapping him. There is no open area on the right leg over his left leg has multiple weeping areas as usual 02/09/18; The same wrap on for 10 days. He did not pick up the last doxycycline I prescribed for him. He apparently took 4 days worth he already had. There is nothing open on his right leg and the edema control is really quite good. He's had damage in the left leg medially and laterally especially probably related to the prolonged use of Unna boots 02/12/18; the patient arrived in clinic today for a nurse visit/wrap change. He complained of a lot of pain in the left posterior calf. He is taking doxycycline that I previously prescribed for him. Unfortunately even though he used his stockings and apparently used to compression pumps twice a day he has weeping edema coming out of the lateral part of his right leg. This is coming from the lower anterior lateral skin area. 02/16/18; the patient has finished his doxycycline and  will finish the amoxicillin 2 days. The area of cellulitis in the left calf posteriorly has resolved. He is no longer having any pain. He tells me he is using his compression pumps at least once a day sometimes twice. 02/23/18; the patient finished his doxycycline and Amoxil last week. On Friday he noticed a small erythematous circle about the size of a quarter on the left lower leg just above his ankle. This rapidly expanded and he now has erythema on the lateral and posterior part of the thigh. This is bright red. Also has an area on the dorsal foot just above his toes and a tender area just below the left popliteal fossa. He came off his prophylactic penicillin injections at his own insistence one or 2 months ago. This is obviously deteriorated since then 03/02/18; patient is on doxycycline and Amoxil. Culture I did last week of the weeping area on the back of his left calf grew group B strep. I have therefore renewed the amoxicillin 500 3 times a day for a further week. He has not been systemically unwell. Still complaining of an area of discomfort right under his left popliteal fossa. There is no open wound on the right leg. He tells me that he is using his pumps twice a day on most days 03/09/18; patient arrives in clinic today completing his amoxicillin today. The cellulitis on his left leg is better. Furthermore he tells me that he had intramuscular penicillin shots that his primary care office today. However he also states that the wrap on his right leg fell down shortly after leaving clinic last week. He developed a large blister that was present when he came in for a nurse visit later in the week and then he developed intense discomfort around this area.He tells me he is using his compression pumps 03/16/18; the patient has completed his doxycycline. The infectious part of this/cellulitis in the left heel  area left popliteal area is a lot better. He has 2 open areas on the right calf. Still  areas on the left calf but this is a lot better as well. 03/24/18; the patient arrives complaining of pain in the left popliteal area again. He thinks some of this is wrap injury. He has no open area on the right leg and really no open area on the left calf either except for the popliteal area. He claims to be compliant with the compression pumps 03/31/18; I gave him doxycycline last week because of cellulitis in the left popliteal area. This is a lot better although the surface epithelium is denuded off and response to this. He arrives today with uncontrolled edema in the right calf area as well as a fingernail injury in the right lateral calf. There is only a few open areas on the left 04/06/18; I gave him amoxicillin doxycycline over the last 2 weeks that the amoxicillin should be completing currently. He is not complaining of any pain or systemic symptoms. The only open areas see has is on the right lateral lower leg paradoxically I cannot see anything on the left lower leg. He tells me he is using his compression pumps twice a day on most days. Silver alginate to the wounds that are open under 4 layer compression 04/13/18; he completed antibiotics and has no new complaints. Using his compression pumps. Silver alginate that anything that's opened 04/20/18; he is using his compression pumps religiously. Silver alginate 4 layer compression anything that's opened. He comes in today with no open wounds on the left leg but 3 on the right including a new one posteriorly. He has 2 on the right lateral and one on the right posterior. He likes Unna boots on the right leg for reasons that aren't really clear we had the usual 4 layer compression on the left. It may be necessary to move to the 4 layer compression on the right however for now I left them in the Unna boots 04/27/18; he is using his compression pumps at least once a day. He has still the wounds on the right lateral calf. The area right posteriorly  has closed. He does not have an open wound on the left under 4 layer compression however on the dorsal left foot just proximal to the toes and the left third toe 2 small open areas were identified 05/11/18; he has not uses compression pumps. The areas on the right lateral calf have coalesced into one large wound necrotic surface. On the left side he has one small wound anteriorly however the edema is now weeping out of a large part of his left leg. He says he wasn't using his pumps because of the weeping fluid. I explained to him that this is the time he needs to pump more 05/18/18; patient states he is using his compression pumps twice a day. The area on the right lateral large wound albeit superficial. On the left side he has innumerable number of small new wounds on the left calf particularly laterally but several anteriorly and medially. All these appear to have healthy granulated base these look like the remnants of blisters however they occurred under compression. The patient arrives in clinic today with his legs somewhat better. There is certainly less edema, less multiple open areas on the left calf and the right anterior leg looks somewhat better as well superficial and a little smaller. However he relates pain and erythema over the last 3-4 days in the  thigh and I looked at this today. He has not been systemically unwell no fever no chills no change in blood sugar values 05/25/18; comes in today in a better state. The severe cellulitis on his left leg seems better with the Keflex. Not as tender. He has not been systemically unwell Hard to find an open wound on the left lower leg using his compression pumps twice a day The confluent wounds on his right lateral calf somewhat better looking. These will ultimately need debridement I didn't do this today. 06/01/18; the severe cellulitis on the left anterior thigh has resolved and he is completed his Keflex. There is no open wound on the left leg  however there is a superficial excoriation at the base of the third toe dorsally. Skin on the bottom of his left foot is macerated looking. The left the wounds on the lateral right leg actually looks some better although he did require debridement of the top half of this wound area with an open curet 06/09/18 on evaluation today patient appears to be doing poorly in regard to his right lower extremity in particular this appears to likely be infected he has very thick purulent discharge along with a bright green tent to the discharge. This makes me concerned about the possibility of pseudomonas. He's also having increased discomfort at this point on evaluation. Fortunately there does not appear to be any evidence of infection spreading to the other location at this time. 06/16/18 on evaluation today patient appears to actually be doing fairly well. His ulcer has actually diminished in size quite significantly at this point which is good news. Nonetheless he still does have some evidence of infection he did see infectious disease this morning before coming here for his appointment. I did review the results of their evaluation and their note today. They did actually have him discontinue the Cipro and initiate treatment with linezolid at this time. He is doing this for the next seven days and they recommended a follow-up in four months with them. He is the keep a log of the need for intermittent antibiotic therapy between now and when he falls back up with infectious disease. This will help them gaze what exactly they need to do to try and help them out. 06/23/18; the patient arrives today with no open wounds on the left leg and left third toe healed. He is been using his compression pumps twice a day. On the right lateral leg he still has a sizable wound but this is a lot better than last time I saw this. In my absence he apparently cultured MRSA coming from this wound and is completed a course of  linezolid as has been directed by infectious disease. Has been using silver alginate under 4 layer compression 06/30/18; the only open wound he has is on the right lateral leg and this looks healthy. No debridement is required. We have been using silver alginate. He does not have an open wound on the left leg. There is apparently some drainage from the dorsal proximal third toe on the left although I see no open wound here. 07/03/18 on evaluation today patient was actually here just for a nurse visit rapid change. However when he was here on Wednesday for his rat change due to having been healed on the left and then developing blisters we initiated the wrap again knowing that he would be back today for [place] to reevaluate and see were at. Unfortunately he has developed some cellulitis into the  proximal portion of his right lower extremity even into the region of his thigh. 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 He was placed on one as what at that point. Nonetheless he is done with that and has been tolerating it well otherwise. Doxycycline which in the past really did not seem to be effective for him. Nonetheless I think the best option may be for [place] to definitely reinitiate the antibiotics for a longer period of time. 07/07/18; since I last saw this patient a week ago he has had a difficult time. At that point he did not have an open wound on his left leg. We transitioned him into juxta light stockings. He was apparently in the clinic the next day with blisters on the left lateral and left medial lower calf. He also had weeping edema fluid. He was put back into a compression wrap. He was also in the clinic on Friday with intense erythema in his right thigh. Per the patient he was started on Bactrim however that didn't work at all in terms of relieving his pain and swelling. He has taken 3 doxycycline that he had left over from last time and that seems to of  helped. He has blistering on the right thigh as well. 07/14/18; the erythema on his right thigh has gotten better with doxycycline that he is finishing. 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 Presumably this cellulitis in the thigh was not related to that although I think this is a bit concerning going forward. He still has an area on the right lateral calf the blister on the right medial calf just below the knee that was discussed above. On the left 2 small open areas left medial and left lateral. Edema control is adequate. He is using his compression pumps twice a day 07/20/18; continued improvement in the condition of both legs especially the edema in his bilateral thighs. He tells me he is been losing weight through a combination of diet and exercise. He is using his compression pumps twice a day. So overall she made to the remaining wounds 07/27/2018; continued improvement in condition of both legs. His edema is well controlled. The area on the right lateral leg is just about closed he had one blisters show up on the medial left upper calf. We have him in 4 layer compression. He is going on a 10-day trip to [place], [place] and [place]. He will be driving. He wants to wear Unna boots because of the lessening amount of constriction. He will not use compression pumps while he is away 08/05/18 on evaluation today patient actually appears to be doing decently well all things considered in regard to his bilateral lower extremities. The worst ulcer is actually only posterior aspect of his left lower extremity with a four layer compression wrap cut into his leg a couple weeks back. He did have a trip and actually had [employer] for the trip that he is worn since he was last here. Nonetheless he feels like the [employer] actually do better for him his swelling is up a little bit but he also with his trip was  not taking his Lasix on a regular set schedule like he was supposed to be. He states that obviously the reason being that he cannot drive and keep going without having to urinate too frequently  which makes it difficult. He did not have his pumps with him while he was away either which I think also maybe playing a role here too. 08/13/2018; the patient only has a small open wound on the right lateral calf which is a big improvement in the last month or 2. He also has the area posteriorly just below the posterior fossa on the left which I think was a wrap injury from several weeks ago. He has no current evidence of cellulitis. He tells me he is back into his compression pumps twice a day. He also tells me that while he was at the laundromat somebody stole a section of his extremitease stockings 08/20/2018; back in the clinic with a much improved state. He only has small areas on the right lateral mid calf which is just about healed. This was is more substantial area for quite a prolonged period of time. He has a small open area on the left anterior tibia. The area on the posterior calf just below the popliteal fossa is closed today. He is using his compression pumps twice a day 08/28/2018; patient has no open wound on the right leg. He has a smattering of open areas on the calf with some weeping lymphedema. More problematically than that it looks as though his wraps of slipped down in his usual he has very angry upper area of edema just below the right medial knee and on the right lateral calf. He has no open area on his feet. The patient is traveling to [facility] next week. I will send him in an antibiotic. We will continue to wrap the right leg. We ordered extremitease stockings for him last week and I plan to transition the right leg to a stocking when he gets home which will be in 10 days time. As usual he is very reluctant to take his pumps with him when he travels 09/07/2018; patient  returns from [facility]. He shows me a picture of his left leg in the mid part of his trip last week with intense fire engine erythema. The picture look bad enough I would have considered sending him to the hospital. Instead he went to the wound care center in [facility]. They did not prescribe him antibiotics but he did take some doxycycline he had leftover from a previous visit. I had given him trimethoprim sulfamethoxazole before he left this did not work according to the patient. This is resulted in some improvement fortunately. He comes back with a large wound on the left posterior calf. Smaller area on the left anterior tibia. Denuded blisters on the dorsal left foot over his toes. Does not have much in the way of wounds on the right leg although he does have a very tender area on the right posterior area just below the popliteal fossa also suggestive of infection. He promises me he is back on his pumps twice a day 09/15/2018; the intense cellulitis in his left lower calf is a lot better. The wound area on the posterior left calf is also so better. However he has reasonably extensive wounds on the dorsal aspect of his second and third toes and the proximal foot just at the base of the toes. There is nothing open on the right leg 09/22/2018; the patient has excellent edema control in his legs bilaterally. He is using his external compression pumps twice a day. He has no open area on the right leg and only the areas in the left foot dorsally  second and third toe area on the left side. He does not have any signs of active cellulitis. 10/06/2018; the patient has good edema control bilaterally. He has no open wound on the right leg. There is a blister in the posterior aspect of his left calf that we had to deal with today. He is using his compression pumps twice a day. There is no signs of active cellulitis. We have been using silver alginate to the wound areas. He still  has vulnerable areas on the base of his left first second toes dorsally He has a his extremities stockings and we are going to transition him today into the stocking on the right leg. He is cautioned that he will need to continue to use the compression pumps twice a day. If he notices uncontrolled edema in the right leg he may need to go to 3 times a day. 10/13/2018; the patient came in for a nurse check on Friday he has a large flaccid blister on the right medial calf just below the knee. We unroofed this. He has this and a new area underneath the posterior mid calf which was undoubtedly a blister as well. He also has several small areas on the right which is the area we put his extremities stocking on. 10/19/2018; the patient went to see infectious disease this morning I am not sure if that was a routine follow-up in any case the doxycycline I had given him was discontinued and started on linezolid. He has not started this. It is easy to look at his left calf and the inflammation and think this is cellulitis however he is very tender in the tissue just below the popliteal fossa and I have no doubt that there is infection going on here. He states the problem he is having is that with the compression pumps the edema goes down and then starts walking the wrap falls down. We will see if we can adhere this. He has 1 or 2 minuscule open areas on the right still areas that are weeping on the posterior left calf, the base of his left second and third toes 10/26/18; back today in clinic with quite of skin breakdown in his left anterior leg. This may have been infection the area below the popliteal fossa seems a lot better however tremendous epithelial loss on the left anterior mid tibia area over quite inexpensive tissue. He has 2 blisters on the right side but no other open wound here. 10/29/2018; came in urgently to see [place] today and we worked him in for review. He states that the 4 layer compression  on the right leg caused pain he had to cut it down to roughly his mid calf this caused swelling above the wrap and he has blisters and skin breakdown today. As a result of the pain he has not been using his pumps. Both legs are a lot more edematous and there is a lot of weeping fluid. 11/02/18; arrives in clinic with continued difficulties in the right leg> left. Leg is swollen and painful. multiple skin blisters and new open areas especially laterally. He has not been using his pumps on the right leg. 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 He is not systemically unwell. 11/09/2018; the patient claims he is being compliant with his pumps. He is finished the doxycycline I gave him last week. Culture I did of the wound on the right lateral leg showed a few very resistant methicillin  staph aureus. This was resistant to doxycycline. Nevertheless he states the pain in the leg is a lot better which makes me wonder if the cultured organism was not really what was causing the problem nevertheless this is a very dangerous organism to be culturing out of any wound. His right leg is still a lot larger than the left. He is using an Haematologist on this area, he blames a 4-layer compression for causing the original skin breakdown which I doubt is true however I cannot talk him out of it. We have been using silver alginate to all of these areas which were initially blisters 11/16/2018; patient is being compliant with his external compression pumps at twice a day. Miraculously he arrives in clinic today with absolutely no open wounds. He has better edema control on the left where he has been using 4 layer compression versus wound of wounds on the right and I pointed this out to him. There is no inflammation in the skin in his lower legs which is also somewhat unusual for him. There is no open wounds on the dorsal left foot. He has extremitease stockings at home and I  have asked him to bring these in next week. 11/25/18 patient's lower extremity on examination today on the left appears for the most part to be wound free. He does have an open wound on the lateral aspect of the right lower extremity but this is minimal compared to what I've seen in past. He does request that we go ahead and wrap the left leg as well even though there's nothing open just so hopefully it will not reopen in short order. 1/28; patient has superficial open wounds on the right lateral calf left anterior calf and left posterior calf. His edema control is adequate. 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 We have been using silver alginate as the primary dressing. He claims compliance with his compression pumps 2/4; patient has superficial open wounds on numerous areas of his left calf and again one on the left dorsal foot. The areas on the right lateral calf have healed. The cellulitis that I gave him doxycycline for last week is also resolved this was mostly on the left anterior calf just below the tibial tuberosity. His edema looks fairly well-controlled. He tells me he went to see his primary doctor today and had blood work ordered 2/11; once again he has several open areas on the left calf left tibial area. Most of these are small and appear to have healthy granulation. He does not have anything open on the right. The edema and control in his thighs is pretty good which is usually a good indication he has been using his pumps as requested. 2/18; he continues to have several small areas on the left calf and left tibial area. Most of these are small healthy granulation. We put him in his stocking on the right leg last week and he arrives with a superficial open area over the right upper tibia and a fairly large area on the right lateral tibia in similar condition. His edema control actually does not  look too bad, he claims to be using his compression pumps twice a day 2/25. Continued small areas on the left calf and left tibial area. New areas especially on the right are identified just below the tibial tuberosity and on the right upper tibia itself. There are also areas of  weeping edema fluid even without an obvious wound. He does not have a considerable degree of lymphedema but clearly there is more edema here than his skin can handle. He states he is using the pumps twice a day. We have an Unna boot on the right and 4 layer compression on the left. 3/3; he continues to have an area on the right lateral calf and right posterior calf just below the popliteal fossa. There is a fair amount of tenderness around the wound on the popliteal fossa but I did not see any evidence of cellulitis, could just be that the wrap came down and rubbed in this area. He does not have an open area on the left leg however there is an area on the left dorsal foot at the base of the third toe We have been using silver alginate to all wound areas 3/10; he did not have an open area on his left leg last time he was here a week ago. Today he arrives with a horizontal wound just below the tibial tuberosity and an area on the left lateral calf. He has intense erythema and tenderness in this area. The area is on the right lateral calf and right posterior calf better than last week. We have been using silver alginate as usual 3/18 - Patient returns with 3 small open areas on left calf, and 1 small open area on right calf, the skin looks ok with no significant erythema, he continues the UNA boot on right and 4 layer compression on left. The right lateral calf wound is closed , the right posterior is small area. we will continue silver alginate to the areas. Culture results from right posterior calf wound is + MRSA sensitive to Bactrim but resistant to DOXY 01/27/19 on evaluation today patient's bilateral lower extremities  actually appear to be doing fairly well at this point which is good news. He is been tolerating the dressing changes without complication. Fortunately she has made excellent improvement in regard to the overall status of his wounds. Unfortunately every time we cease wrapping him he ends up reopening in causing more significant issues at that point. Again I'm unsure of the best direction to take although I think the lymphedema clinic may be appropriate for him. 02/03/19 on evaluation today patient appears to be doing well in regard to the wounds that we saw him for last week unfortunately he has a new area on the proximal portion of his right medial/posterior lower extremity where the wrap somewhat slowed down and caused swelling and a blister to rub and open. Unfortunately this is the only opening that he has on either leg at this point. 02/17/19 on evaluation today patient's bilateral lower extremities appear to be doing well. He still completely healed in regard to the left lower extremity. In regard to the right lower extremity the area where the wrap and slid down and caused the blister still seems to be slightly open although this is dramatically better than during the last evaluation two weeks ago. I'm very pleased with the way this stands overall. 03/03/19 on evaluation today patient appears to be doing well in regard to his right lower extremity in general although he did have a new blister open this does not appear to be showing any evidence of active infection at this time. Fortunately there's No fevers, chills, nausea, or vomiting noted at this time. Overall I feel like he is making good progress it does feel like that the right leg will  we perform the [employer] seems to do with a bit better than three layer wrap on the left which slid down on him. We may switch to doing bilateral in the book wraps. 5/4; I have not seen [name] in quite some time. According to our case manager he did not  have an open wound on his left leg last week. He had 1 remaining wound on the right posterior medial calf. He arrives today with multiple openings on the left leg probably were blisters and/or wrap injuries from Unna boots. I do not think the Unna boot's will provide adequate compression on the left. I am also not clear about the frequency he is using the compression pumps. 03/17/19 on evaluation today patient appears to be doing excellent in regard to his lower extremities compared to last week's evaluation apparently. He had gotten significantly worse last week which is unfortunate. The [employer] wrap on the left did not seem to do very well for him at all and in fact it didn't control his swelling significantly enough he had an additional outbreak. Subsequently we go back to the four layer compression wrap on the left. This is good news. At least in that he is doing better and the wound seem to be killing him. He still has not heard anything from the lymphedema clinic. 03/24/19 on evaluation today patient actually appears to be doing much better in regard to his bilateral lower Trinity as compared to last week when I saw him. Fortunately there's no signs of active infection at this time. He has been tolerating the dressing changes without complication. Overall I'm extremely pleased with the progress and appearance in general. 04/07/19 on evaluation today patient appears to be doing well in regard to his bilateral lower extremities. His swelling is significantly down from where it was previous. With that being said he does have a couple blisters still open at this point but fortunately nothing that seems to be too severe and again the majority of the larger openings has healed at this time. 04/14/19 on evaluation today patient actually appears to be doing quite well in regard to his bilateral lower extremities in fact I'm not even sure there's anything significantly open at this time at any site.  Nonetheless he did have some trouble with these wraps where they are somewhat irritating him secondary to the fact that he has noted that the graph wasn't too close down to the end of this foot in a little bit short as well up to his knee. Otherwise things seem to be doing quite well. 04/21/19 upon evaluation today patient's wound bed actually showed evidence of being completely healed in regard to both lower extremities which is excellent news. There does not appear to be any signs of active infection which is also good news. I'm very pleased in this regard. No fevers, chills, nausea, or vomiting noted at this time. 04/28/19 on evaluation today patient appears to be doing a little bit worse in regard to both lower extremities on the left mainly due to the fact that when he went infection disease the wrap was not wrapped quite high enough he developed a blister above this. On the right he is a small open area of nothing too significant but again this is continuing to give him some trouble he has been were in the Velcro compression that he has at home. 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 He's  been tolerating the dressing changes without complication. Fortunately there's no signs of active infection at this time. No fevers, chills, nausea, or vomiting noted at this time. We have been trying to get an appointment with her lymphedema clinic in [facility] but unfortunately nobody can get them on phone with not been able to even fax information over the patient likewise is not been able to get in touch with them. Overall I'm not sure exactly what's going on here with to reach out again today. 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 Still having a lot of drainage unfortunately. He tells me especially in the left but even on the right.  There's no signs of active infection which is good news we've been using so ratcheted up to this point. 05/19/19 on evaluation today patient actually appears to be doing quite well with regard to his left lower extremity which is great news. Fortunately in regard to the right lower extremity has an issues with his wrap and he subsequently did remove this from what I'm understanding. Nonetheless long story short is what he had rewrapped once he removed it subsequently had maggots underneath this wrap whenever he came in for evaluation today. With that being said they were obviously completely cleaned away by the nursing staff. The visit today which is excellent news. However he does appear to potentially have some infection around the right ankle region where the maggots were located as well. He will likely require anabiotic therapy today. 05/26/19 on evaluation today patient actually appears to be doing much better in regard to his bilateral lower extremities. I feel like the infection is under much better control. With that being said there were maggots noted when the wrap was removed yet again today. Again this could have potentially been left over from previous although at this time there does not appear to be any signs of significant drainage there was obviously on the wrap some drainage as well this contracted gnats or otherwise. Either way I do not see anything that appears to be doing worse in my pinion and in fact I think his drainage has slowed down quite significantly likely mainly due to the fact to his infection being under better control. 06/02/2019 on evaluation today patient actually appears to be doing well with regard to his bilateral lower extremities there is no signs of active infection at this time which is great news. With that being said he does have several open areas more so on the right than the left but nonetheless these are all significantly better than previously  noted. 06/09/2019 on evaluation today patient actually appears to be doing well. His wrap stayed up and he did not cause any problems he had more drainage on the right compared to the left but overall I do not see any major issues at this time which is great news. 06/16/2019 on evaluation today patient appears to be doing excellent with regard to his lower extremities the only area that is open is a new blister that can have opened as of today on the medial ankle on the left. Other than this he really seems to be doing great I see no major issues at this point. 06/23/2019 on evaluation today patient appears to be doing quite well with regard to his bilateral lower extremities. In fact he actually appears to be almost completely healed there is a small area of weeping noted  of the right lower extremity just above the ankle. Nonetheless fortunately there is no signs of active infection at this time which is good news. No fevers, chills, nausea, vomiting, or diarrhea. 8/24; the patient arrived for a nurse visit today but complained of very significant pain in the left leg and therefore I was asked to look at this. Noted that he did not have an open area on the left leg last week nevertheless this was wrapped. The patient states that he is not been able to put his compression pumps on the left leg because of the discomfort. He has not been systemically unwell 06/30/2019 on evaluation today patient unfortunately despite being excellent last week is doing much worse with regard to his left lower extremity today. In fact he had to come in for a nurse on Monday where his left leg had to be rewrapped due to excessive weeping Dr. Leanord Hawking placed him on doxycycline at that point. Fortunately there is no signs of active infection Systemically at this time which is good news. 07/07/2019 in regard to the patient's wounds today he actually seems to be doing well with his right lower extremity there really is nothing  open or draining at this point this is great news. Unfortunately the left lower extremity is given him additional trouble at this time. There does not appear to be any signs of active infection nonetheless he does have a lot of edema and swelling noted at this point as well as blistering all of which has led to a much more poor appearing leg at this time compared to where it was 2 weeks ago when it was almost completely healed. Obviously this is a little discouraging for the patient. He is try to contact the [place]hed[place] he has not been able to get through to them. 07/14/2019 on evaluation today patient actually appears to be doing slightly better with regard to his left lower extremity ulcers. Overall I do feel like at least at the top of the wrap that we have been placing this area has healed quite nicely and looks much better. The remainder of the leg is showing signs of improvement. Unfortunately in the thigh area he still has an open region on the left and again on the right he has been utilizing just a Band-Aid on an area that also opened on the thigh. Again this is an area that were not able to wrap although we did do an Ace wrap to provide some compression that something that obviously is a little less effective than the compression wraps we have been using on the lower portion of the leg. He does have an appointment with the lymphedema clinic in [facility] on Friday. 07/21/2019 on evaluation today patient appears to be doing better with regard to his lower extremity ulcers. He has been tolerating the dressing changes without complication. Fortunately there is no signs of active infection at this time. No fevers, chills, nausea, vomiting, or diarrhea. I did receive the paperwork from the physical therapist at the lymphedema clinic in [place]. Subsequently I signed off on that this morning and sent that back to him for further progression with the treatment  plan. 07/28/2019 on evaluation today patient appears to be doing very well with regard to his right lower extremity where I do not see any open wounds at this point. Fortunately he is feeling great as far as that is concerned as well. In regard to the left lower extremity he has been having  issues with still several areas of weeping and edema although the upper leg is doing better his lower leg still I think is going require the compression wrap at this time. No fevers, chills, nausea, vomiting, or diarrhea. 08/04/2019 on evaluation today patient unfortunately is having new wounds on the right lower extremity. Again we have been using Unna boot wrap on that side. We switched him to using his juxta lite wrap at home. With that being said he tells me he has been using it although his legs extremely swollen and to be honest really does not appear that he has been. I cannot know that for sure however. Nonetheless he has multiple new wounds on the right lower extremity at this time. Obviously we will have to see about getting this rewrapped for him today. 08/11/2019 on evaluation today patient appears to be doing fairly well with regard to his wounds. He has been tolerating the dressing changes including the compression wraps without complication. He still has a lot of edema in his upper thigh regions bilaterally he is supposed to be seeing the lymphedema clinic on the 15th of this month once his wraps arrive for the upper part of his legs. 08/18/2019 on evaluation today patient appears to be doing well with regard to his bilateral lower extremities at this point. He has been tolerating the dressing changes without complication. Fortunately there is no signs of active infection which is also good news. He does have a couple weeping areas on the first and second toe of the right foot he also has just a small area on the left foot upper leg and a small area on the left lower leg but overall he is  doing quite well in my opinion. He is supposed to be getting his wraps shortly in fact tomorrow and then subsequently is seeing the lymphedema clinic next Wednesday on the 21st. Of note he is also leaving on the 25th to go on vacation for a week to the beach. For that reason and since there is some uncertainty about what there can be doing at lymphedema clinic next Wednesday I am get a make an appointment for next Friday here for [place] to see what we need to do for him prior to him leaving for vacation. 10/23; patient arrives in considerable pain predominantly in the upper posterior calf just distal to the popliteal fossa also in the wound anteriorly above the major wound. This is probably cellulitis and he has had this recurrently in the past. He has no open wound on the right side and he has had an Radio broadcast assistant in that area. Finally I note that he has an area on the left posterior calf which by enlarge is mostly epithelialized. This protrudes beyond the borders of the surrounding skin in the setting of dry scaly skin and lymphedema. The patient is leaving for [facility] on Sunday. Per his longstanding pattern, he will not take his compression pumps with him predominantly out of fear that they will be stolen. He therefore asked that we put a Unna boot back on the right leg. He will also contact the wound care center in [facility] to see if they can change his dressing in the mid week. 11/3; patient returned from his vacation to [facility]. He was seen on 1 occasion at their wound care center. They did a 2 layer compression system as they did not have our 4-layer wrap. I am not completely certain what they put on the wounds.  They did not change the Unna boot on the right. The patient is also seeing a lymphedema specialist physical therapist in [place]. It appears that he has some compression sleeve for his thighs which indeed look quite a bit better than I am used to seeing. He pumps over  these with his external compression pumps. 11/10; the patient has a new wound on the right medial thigh otherwise there is no open areas on the right. He has an area on the left leg posteriorly anteriorly and medially and an area over the left second toe. We have been using silver alginate. He thinks the injury on his thigh is secondary to friction from the compression sleeve he has. Electronic Signature(s) Signed: 09/14/2019 5:17:49 PM By: Baltazar Najjar MD Entered By: Baltazar Najjar on 09/14/2019 11:38:54 -------------------------------------------------------------------------------- Physical Exam Details Patient Name: Date of Service: Kevin, [name] 09/14/2019 10:00 AM Medical Record [id number] Patient Account Number: [id number] Date of Birth/Sex: Treating RN: 1951-09-27 (68 y.o. [name]) Yevonne Pax Primary Care Provider: Nicoletta Ba Other Clinician: Referring Provider: Treating Provider/Extender:Robson, Lottie Rater, [name] in Treatment: 190 Constitutional Patient is hypertensive.. Pulse regular and within target range for patient.Marland Kitchen Respirations regular, non-labored and within target range.. Temperature is normal and within the target range for the patient.Marland Kitchen Appears in no distress. Eyes Conjunctivae clear. No discharge.no icterus. Respiratory work of breathing is normal. Cardiovascular Pedal pulses palpable and strong bilaterally.. His edema control in both legs, calfs and thighs is as good as I have ever seen it. Integumentary (Hair, Skin) Skin changes of chronic venous insufficiency with hemosiderin deposition. He has a nodular area on the left posterior calf this will need to be observed carefully over time if it enlarges it may need to be biopsied. Psychiatric appears at normal baseline. Notes Wound exam; right lower leg has no open area. On the right medial thigh there is a superficial area with some erythema. I think the erythema is secondary to  irritation from his compression sleeve in this area. I do not think there is cellulitis On the left anterior lower leg there is superficial areas as well as left lateral left medial and a more punched out area on the left second toe. I did not think there was cellulitis in any wound area Electronic Signature(s) Signed: 09/14/2019 5:17:49 PM By: Baltazar Najjar MD Entered By: Baltazar Najjar on 09/14/2019 11:41:33 -------------------------------------------------------------------------------- Physician Orders Details Patient Name: Date of Service: [name] 09/14/2019 10:00 AM Medical Record [id number] Patient Account Number: [id number] Date of Birth/Sex: Treating RN: 07/31/1951 (68 y.o. 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 Dressing Change Frequency Do not change entire dressing for one week. - both legs Skin Barriers/Peri-Wound Care TCA Cream or Ointment - mix with lotion both legs Wound Cleansing May shower with protection. Primary Wound Dressing Wound #140 Left,Posterior,Circumferential Lower Leg Calcium Alginate with Silver Wound #142 Left,Anterior Lower Leg Calcium Alginate with Silver Wound #145 Right,Medial Upper Leg  Calcium Alginate  with Silver Wound #146 Left Toe Second Calcium Alginate with Silver Secondary Dressing Wound #140 Left,Posterior,Circumferential Lower Leg Dry Gauze ABD pad Wound #142 Left,Anterior Lower Leg Dry Gauze ABD pad Foam Border Wound #145 Right,Medial Upper Leg Foam Border Wound #146 Left Toe Second Kerlix/Rolled Gauze - secure with tape Dry Gauze Edema Control 4 layer compression: Left lower extremity - unna boot at upper portion of lower leg. Unna Boot to Right Lower Extremity Avoid standing for long periods of time Elevate legs to the level of the heart or above for 30 minutes daily and/or when sitting, a frequency of: Exercise regularly Segmental Compressive Device. - lymphadema pumps 60 min 2 times per day Electronic Signature(s) Signed: 09/14/2019 5:17:49 PM By: Baltazar Najjar MD Signed: 10/12/2019 2:53:59 PM By: Yevonne Pax RN Entered By: Yevonne Pax on 09/14/2019 11:27:27 -------------------------------------------------------------------------------- Problem List Details Patient Name: Date of Service: [name]. 09/14/2019 10:00 AM Medical Record [id number] Patient Account Number: [id number] Date of Birth/Sex: Treating RN: 05/26/1951 (68 y.o. [name]) Jettie Pagan, [place] Primary Care Provider: Nicoletta Ba Other Clinician: Referring Provider: Treating Provider/Extender:Robson, Lottie Rater, [name] in Treatment: 190 Active Problems ICD-10 Evaluated Encounter Code Description Active Date Today Diagnosis L97.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 right calf limited to 06/30/2018 No Yes breakdown of skin L97.221 Non-pressure chronic ulcer of left calf limited to 09/30/2016 No Yes breakdown of skin I87.333 Chronic venous hypertension (idiopathic) with ulcer 01/22/2016 No Yes and inflammation of bilateral lower extremity I89.0 Lymphedema, not elsewhere classified 01/22/2016 No Yes E11.622 Type 2 diabetes mellitus with other skin ulcer 01/22/2016 No Yes E11.40 Type  2 diabetes mellitus with diabetic neuropathy, 01/22/2016 No Yes unspecified L03.116 Cellulitis of left lower limb 04/01/2017 No Yes Inactive Problems ICD-10 Code Description Active Date Inactive Date 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 06/30/2017 skin L97.521 Non-pressure chronic ulcer of other part of left foot limited to 04/27/2018 04/27/2018 breakdown of skin L03.115 Cellulitis of right lower limb 12/22/2017 12/22/2017 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 with other specified 06/30/2018 06/30/2018 severity L97.511 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 06/30/2018 06/30/2018 breakdown of skin Resolved Problems Electronic Signature(s) Signed: 09/14/2019 5:17:49 PM By: Baltazar Najjar MD Entered By: Baltazar Najjar on 09/14/2019 11:37:34 -------------------------------------------------------------------------------- Progress Note Details Patient Name: Date of Service: [name] 09/14/2019 10:00 AM Medical Record [id number] Patient Account Number: [id number] Date of Birth/Sex: Treating RN: 1951-07-17 (68 y.o. [name]) Yevonne Pax Primary Care Provider: Nicoletta Ba Other Clinician: Referring Provider: Treating Provider/Extender:Robson, Lottie Rater, [name] in Treatment: 190 Subjective History of Present Illness (HPI) Referred by PCP for consultation. Patient has long standing history of BLE venous stasis, no prior ulcerations. At beginning of month, developed cellulitis and weeping. Received IM Rocephin followed by Keflex and resolved. Wears compression stocking, appr 6 months old. Not sure strength. No present drainage. 01/22/16 this is a patient who is a type II diabetic on insulin. 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 He tells me he religiously wears pressure stockings of uncertain strength. He was here with weeping edema about 8 months ago but  did not have an open wound. Roughly a month ago he had a reopening on his bilateral legs. He is been using bandages and Neosporin. He does not complain of pain. 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 Last  BUN/creatinine I have is from 11/20/15 at 13 and 1.0 respectively 01/29/16; patient arrives today having tolerated the Profore wrap. He brought in his stockings and these are 18 mmHg stockings he bought from [place]. The compression here is likely inadequate. He does not complain of pain or excessive drainage she has no systemic symptoms. The wound on the right looks improved as does the one on the left although one on the left is more substantial with still tissue at risk below the actual wound area on the bilateral posterior calf 02/05/16; patient arrives with poor edema control. He states that we did put a 4 layer compression on it last week. No weight appear 5 this. 02/12/16; the area on the posterior right Has healed. The left Has a substantial wound that has necrotic surface eschar that requires a debridement with a curette. 02/16/16;the patient called or a Nurse visit secondary to increased swelling. He had been in earlier in the week with his right leg healed. He was transitioned to is on pressure stocking on the right leg with the only open wound on the left, a substantial area on the left posterior calf. Note he has a history of severe lower extremity edema, he has a history of chronic atrial fibrillation but not heart failure per my notes but I'll need to research this. He is not complaining of chest pain shortness of breath or orthopnea. The intake nurse noted blisters on the previously closed right leg 02/19/16; this is the patient's regular visit day. I see him on Friday with escalating edema new wounds on the right leg and clear signs of at least right ventricular heart  failure. I increased his Lasix to 40 twice a day. He is returning currently in follow-up. States he is noticed a decrease in that the edema 02/26/16 patient's legs have much less edema. There is nothing really open on the right leg. The left leg has improved condition of the large superficial wound on the posterior left leg 03/04/16; edema control is very much better. The patient's right leg wounds have healed. On the left leg he continues to have severe venous inflammation on the posterior aspect of the left leg. There is no tenderness and I don't think any of this is cellulitis. 03/11/16; patient's right leg is married healed and he is in his own stocking. The patient's left leg has deteriorated somewhat. There is a lot of erythema around the wound on the posterior left leg. There is also a significant rim of erythema posteriorly just above where the wrap would've ended there is a new wound in this location and a lot of tenderness. Can't rule out cellulitis in this area. 03/15/16; patient's right leg remains healed and he is in his own stocking. The patient's left leg is much better than last review. His major wound on the posterior aspect of his left Is almost fully epithelialized. He has 3 small injuries from the wraps. Really. Erythema seems a lot better on antibiotics 03/18/16; right leg remains healed and he is in his own stocking. The patient's left leg is much better. The area on the posterior aspect of the left calf is fully epithelialized. His 3 small injuries which were wrap injuries on the left are improved only one seems still open his erythema has resolved 03/25/16; patient's right leg remains healed and he is in his own stocking. There is no open area today on the left leg posterior leg is completely closed up. His wrap injuries at the superior  aspect of his leg are also resolved. He looks as though he has some irritation on the dorsal ankle but this is fully epithelialized without  evidence of infection. 03/28/16; we discharged this patient on Monday. Transitioned him into his own stocking. There were problems almost immediately with uncontrolled swelling weeping edema multiple some of which have opened. He does not feel systemically unwell in particular no chest pain no shortness of breath and he does not feel 04/08/16; the edema is under better control with the Profore light wrap but he still has pitting edema. There is one large wound anteriorly 2 on the medial aspect of his left leg and 3 small areas on the superior posterior calf. Drainage is not excessive he is tolerating a Profore light well 04/15/16; put a Profore wrap on him last week. This is controlled is edema however he had a lot of pain on his left anterior foot most of his wounds are healed 04/22/16 once again the patient has denuded areas on the left anterior foot which he states are because his wrap slips up word. He saw his primary physician today is on Lasix 40 twice a day and states that he his weight is down 20 pounds over the last 3 months. 04/29/16: Much improved. left anterior foot much improved. He is now on Lasix 80 mg per day. Much improved edema control 05/06/16; I was hoping to be able to discharge him today however once again he has blisters at a low level of where the compression was placed last week mostly on his left lateral but also his left medial leg and a small area on the anterior part of the left foot. 05/09/16; apparently the patient went home after his appointment on 7/4 later in the evening developing pain in his upper medial thigh together with subjective fever and chills although his temperature was not taken. The pain was so intense he felt he would probably have to call 911. However he then remembered that he had leftover doxycycline from a previous round of antibiotics and took these. By the next morning he felt a lot better. He called and spoke to one of our nurses and I approved  doxycycline over the phone thinking that this was in relation to the wounds we had previously seen although they were definitely were not. The patient feels a lot better old fever no chills he is still working. Blood sugars are reasonably controlled 05/13/16; patient is back in for review of his cellulitis on his anterior medial upper thigh. He is taking doxycycline this is a lot better. Culture I did of the nodular area on the dorsal aspect of his foot grew MRSA this also looks a lot better. 05/20/16; the patient is cellulitis on the medial upper thigh has resolved. All of his wound areas including the left anterior foot, areas on the medial aspect of the left calf and the lateral aspect of the calf at all resolved. He has a new blister on the left dorsal foot at the level of the fourth toe this was excised. No evidence of infection 05/27/16; patient continues to complain weeping edema. He has new blisterlike wounds on the left anterior lateral and posterior lateral calf at the top of his wrap levels. The area on his left anterior foot appears better. He is not complaining of fever, pain or pruritus in his feet. 05/30/16; the patient's blisters on his left anterior leg posterior calf all look improved. He did not increase the Lasix 100 mg  as I suggested because he was going to run out of his 40 mg tablets. He is still having weeping edema of his toes 06/03/16; I renewed his Lasix at 80 mg once a day as he was about to run out when I last saw him. He is on 80 mg of Lasix now. I have asked him to cut down on the excessive amount of water he was drinking and asked him to drink according to his thirst mechanisms 06/12/2016 -- was seen 2 days ago and was supposed to wear his compression stockings at home but he is developed lymphedema and superficial blisters on the left lower extremity and hence came in for a review 06/24/16; the remaining wound is on his left anterior leg. He still has edema coming from  between his toes. There is lymphedema here however his edema is generally better than when I last saw this. He has a history of atrial fibrillation but does not have a known history of congestive heart failure nevertheless I think he probably has this at least on a diastolic basis. 07/01/16 I reviewed his echocardiogram from January 2017. This was essentially normal. He did not have LVH, EF of 55-60%. His right ventricular function was normal although he did have trivial tricuspid and pulmonic regurgitation. This is not audible on exam however. I increased his Lasix to do massive edema in his legs well above his knees I think in early July. He was also drinking an excessive amount of water at the time. 07/15/16; missed his appointment last week because of the Labor Day holiday on Monday. He could not get another appointment later in the week. Started to feel the wrap digging in superiorly so we remove the top half and the bottom half of his wrap. He has extensive erythema and blistering superiorly in the left leg. Very tender. Very swollen. Edema in his foot with leaking edema fluid. He has not been systemically unwell 07/22/16; the area on the left leg laterally required some debridement. The medial wounds look more stable. His wrap injury wounds appear to have healed. Edema and his foot is better, weeping edema is also better. He tells me he is meeting with the supplier of the external compression pumps at work 08/05/16; the patient was on vacation last week in [facility]. His wrap is been on for an extended period of time. Also over the weekend he developed an extensive area of tender erythema across his anterior medial thigh. He took to doxycycline yesterday that he had leftover from a previous prescription. The patient complains of weeping edema coming out of his toes 08/08/16; I saw this patient on 10/2. He was tender across his anterior thigh. I put him on doxycycline. He returns today in  follow-up. He does not have any open wounds on his lower leg, he still has edema weeping into his toes. 08/12/16; patient was seen back urgently today to follow-up for his extensive left thigh cellulitis/erysipelas. He comes back with a lot less swelling and erythema pain is much better. I believe I gave him Augmentin and Cipro. His wrap was cut down as he stated a roll down his legs. He developed blistering above the level of the wrap that remained. He has 2 open blisters and 1 intact. 08/19/16; patient is been doing his primary doctor who is increased his Lasix from 40-80 once a day or 80 already has less edema. Cellulitis has remained improved in the left thigh. 2 open areas on the posterior left  calf 08/26/16; he returns today having new open blisters on the anterior part of his left leg. He has his compression pumps but is not yet been shown how to use some vital representative from the supplier. 09/02/16 patient returns today with no open wounds on the left leg. Some maceration in his plantar toes 09/10/2016 -- Dr. Leanord Hawking had recently discharged him on 09/02/2016 and he has come right back with redness swelling and some open ulcers on his left lower extremity. He says this was caused by trying to apply his compression stockings and he's been unable to use this and has not been able to use his lymphedema pumps. He had some doxycycline leftover and he has started on this a few days ago. 09/16/16; there are no open wounds on his leg on the left and no evidence of cellulitis. 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 He does not complain of symptoms here. I am not clear use using his external compression pumps. 09/23/16; I have not seen this patient in 2 weeks. He canceled his appointment 10 days ago as he was going on vacation. He tells me that on Monday he noticed a large area on his posterior left leg which is been draining copiously  and is reopened into a large wound. He is been using ABDs and the external part of his juxtalite, according to our nurse this was not on properly. 10/07/16; Still a substantial area on the posterior left leg. Using silver alginate 10/14/16; in general better although there is still open area which looks healthy. Still using silver alginate. He reminds me that this happen before he left for [facility]. Today while he was showering in the morning. He had been using his juxtalite's 10/21/16; the area on his posterior left leg is fully epithelialized. However he arrives today with a large area of tender erythema in his medial and posterior left thigh just above the knee. I have marked the area. Once again he is reluctant to consider hospitalization. I treated him with oral antibiotics in the past for a similar situation with resolution I think with doxycycline however this area it seems more extensive to me. He is not complaining of fever but does have chills and says states he is thirsty. His blood sugar today was in the 140s at home 10/25/16 the area on his posterior left leg is fully epithelialized although there is still some weeping edema. The large area of tenderness and erythema in his medial and posterior left thigh is a lot less tender although there is still a lot of swelling in this thigh. He states he feels a lot better. He is on doxycycline and Augmentin that I started last week. This will continued until Tuesday, December 26. I have ordered a duplex ultrasound of the left thigh rule out DVT whether there is an abscess something that would need to be drained I would also like to know. 11/01/16; he still has weeping edema from a not fully epithelialized area on his left posterior calf. Most of the rest of this looks a lot better. He has completed his antibiotics. His thigh is a lot better. Duplex ultrasound did not show a DVT in the thigh 11/08/16; he comes in today with more Denuded  surface epithelium from the posterior aspect of his calf. There is no real evidence of cellulitis. The superior aspect of his wrap appears to have put quite an indentation in his leg just  below the knee and this may have contributed. He does not complain of pain or fever. We have been using silver alginate as the primary dressing. The area of cellulitis in the right thigh has totally resolved. He has been using his compression stockings once a week 11/15/16; the patient arrives today with more loss of epithelium from the posterior aspect of his left calf. He now has a fairly substantial wound in this area. The reason behind this deterioration isn't exactly clear although his edema is not well controlled. He states he feels he is generally more swollen systemically. He is not complaining of chest pain shortness of breath fever. Tells me he has an appointment with his primary physician in early February. He is on 80 mg of oral Lasix a day. He claims compliance with the external compression pumps. He is not having any pain in his legs similar to what he has with his recurrent cellulitis 11/22/16; the patient arrives a follow-up of his large area on his left lateral calf. This looks somewhat better today. He came in earlier in the week for a dressing change since I saw him a week ago. He is not complaining of any pain no shortness of breath no chest pain 11/28/16; the patient arrives for follow-up of his large area on the left lateral calf this does not look better. In fact it is larger weeping edema. The surface of the wound does not look too bad. We have been using silver alginate although I'm not certain that this is a dressing issue. 12/05/16; again the patient follows up for a large wound on the left lateral and left posterior calf this does not look better. There continues to be weeping edema necrotic surface tissue. More worrisome than this once again there is erythema below the wound involving the  distal Achilles and heel suggestive of cellulitis. He is on his feet working most of the day of this is not going well. We are changing his dressing twice a week to facilitate the drainage. 12/12/16; not much change in the overall dimensions of the large area on the left posterior calf. This is very inflamed looking. I gave him an. Doxycycline last week does not really seem to have helped. He found the wrap very painful indeed it seems to of dog into his legs superiorly and perhaps around the heel. He came in early today because the drainage had soaked through his dressings. 12/19/16- patient arrives for follow-up evaluation of his left lower extremity ulcers. He states that he is using his lymphedema pumps once daily when there is "no drainage". He admits to not using his lipedema pumps while under current treatment. His blood sugars have been consistently between 150-200. 12/26/16; the patient is not using his compression pumps at home because of the wetness on his feet. I've advised him that I think it's important for him to use this daily. He finds his feet too wet, he can put a plastic bag over his legs while he is in the pumps. Otherwise I think will be in a vicious circle. We are using silver alginate to the major area on his left posterior calf 01/02/17; the patient's posterior left leg has further of all into 3 open wounds. All of them covered with a necrotic surface. He claims to be using his compression pumps once a day. His edema control is marginal. Continue with silver alginate 01/10/17; the patient's left posterior leg actually looks somewhat better. There is less edema, less erythema. Still  has 3 open areas covered with a necrotic surface requiring debridement. He claims to be using his compression pumps once a day his edema control is better 01/17/17; the patient's left posterior calf look better last week when I saw him and his wrap was changed 2 days ago. He has noted increasing pain in  the left heel and arrives today with much larger wounds extensive erythema extending down into the entire heel area especially tender medially. He is not systemically unwell CBGs have been controlled no fever. Our intake nurse showed me limegreen drainage on his AVD pads. 01/24/17; his usual this patient responds nicely to antibiotics last week giving him Levaquin for presumed Pseudomonas. The whole entire posterior part of his leg is much better much less inflamed and in the case of his Achilles heel area much less tender. He has also had some epithelialization posteriorly there are still open areas here and still draining but overall considerably better 01/31/17- He has continue to tolerate the compression wraps. he states that he continues to use the lymphedema pumps daily, and can increase to twice daily on the weekends. He is voicing no complaints or concerns regarding his LLE ulcers 02/07/17-he is here for follow-up evaluation. He states that he noted some erythema to the left medial and anterior thigh, which he states is new as of yesterday. He is concerned about recurrent cellulitis. He states his blood sugars have been slightly elevated, this morning in the 180s 02/14/17; he is here for follow-up evaluation. When he was last here there was erythema superiorly from his posterior wound in his anterior thigh. He was prescribed Levaquin however a culture of the wound surface grew MRSA over the phone I changed him to doxycycline on Monday and things seem to be a lot better. 02/24/17; patient missed his appointment on Friday therefore we changed his nurse visit into a physician visit today. Still using silver alginate on the large area of the posterior left thigh. He isn't new area on the dorsal left second toe 03/03/17; actually better today although he admits he has not used his external compression pumps in the last 2 days or so because of work responsibilities over the weekend. 03/10/17; continued  improvement. External compression pumps once a day almost all of his wounds have closed on the posterior left calf. Better edema control 03/17/17; in general improved. He still has 3 small open areas on the lateral aspect of his left leg however most of the area on the posterior part of his leg is epithelialized. He has better edema control. He has an ABD pad under his stocking on the right anterior lower leg although he did not let us look at that today. 03/24/17; patient arrives back in clinic today with no open areas however there are areas on the posterior left calf and anterior left calf that are less than 100% epithelialized. His edema is well controlled in the left lower leg. There is some pitting edema probably lymphedema in the left upper thigh. He uses compression pumps at home once per day. I tried to get him to do this twice a day although he is very reticent. 04/01/2017 -- for the last 2 days he's had significant redness, tenderness and weeping and came in for an urgent visit today. 04/07/17; patient still has 6 more days of doxycycline. He was seen by Dr. Meyer Russel last Wednesday for cellulitis involving the posterior aspect, lateral aspect of his Involving his heel. For the most part he is better there  is less erythema and less weeping. He has been on his feet for 12 hours o2 over the weekend. Using his compression pumps once a day 04/14/17 arrives today with continued improvement. Only one area on the posterior left calf that is not fully epithelialized. He has intense bilateral venous inflammation associated with his chronic venous insufficiency disease and secondary lymphedema. We have been using silver alginate to the left posterior calf wound In passing he tells [place] today that the right leg but we have not seen in quite some time has an open area on it but he doesn't want [place] to look at this today states he will show this to [place] next week. 04/21/17; there is no open area on his left leg  although he still reports some weeping edema. He showed [place] his right leg today which is the first time we've seen this leg in a long time. He has a large area of open wound on the right leg anteriorly healthy granulation. Quite a bit of swelling in the right leg and some degree of venous inflammation. He told us about the right leg in passing last week but states that deterioration in the right leg really only happened over the weekend 04/28/17; there is no open area on the left leg although there is an irritated part on the posterior which is like a wrap injury. The wound on the right leg which was new from last week at least to [place] is a lot better. 05/05/17; still no open area on the left leg. Patient is using his new compression stocking which seems to be doing a good job of controlling the edema. He states he is using his compression pumps once per day. The right leg still has an open wound although it is better in terms of surface area. Required debridement. A lot of pain in the posterior right Achilles marked tenderness. Usually this type of presentation this patient gives concern for an active cellulitis 05/12/17; patient arrives today with his major wound from last week on the right lateral leg somewhat better. Still requiring debridement. He was using his compression stocking on the left leg however that is reopened with superficial wounds anteriorly he did not have an open wound on this leg previously. He is still using his juxta light's once daily at night. He cannot find the time to do this in the morning as he has to be at work by 7 AM 05/19/17; right lateral leg wound looks improved. No debridement required. The concerning area is on the left posterior leg which appears to almost have a subcutaneous hemorrhagic component to it. We've been using silver alginate to all the wounds 05/26/17; the right lateral leg wound continues to look improved. However the area on the left posterior calf is a  tightly adherent surface. [place] using silver alginate. Because of the weeping edema in his legs there is very little good alternatives. 06/02/17; the patient left here last week looking quite good. Major wound on the left posterior calf and a small one on the right lateral calf. Both of these look satisfactory. He tells me that by Wednesday he had noted increased pain in the left leg and drainage. He called on Thursday and Friday to get an appointment here but we were blocked. He did not go to urgent care or his primary physician. He thinks he had a fever on Thursday but did not actually take his temperature. He has not been using his compression pumps on the left  leg because of pain. I advised him to go to the emergency room today for IV antibiotics for stents of left leg cellulitis but he has refused I have asked him to take 2 days off work to keep his leg elevated and he has refused this as well. In view of this I'm going to call him and Augmentin and doxycycline. 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 However after a detailed discussion regarding the risk benefits and alternatives he refuses to get admitted to the hospital. With no other recourse I will continue him on oral antibiotics as before and hopefully he'll have his infectious disease consultation this week. 06/16/2017 -- the patient was seen today by the nurse practitioner at infectious disease Ms. Dixon. 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 He has also been advise Lotrimin cream locally. she also had wise  IV antibiotics for his condition if it flares up 06/23/17; patient arrives today with drainage bilaterally although the remaining wound on the left posterior calf after cleaning up today "highlighter yellow drainage" did not look too bad. Unfortunately he has had breakdown on the right anterior leg [previously this leg had not been open and he is using a black stocking] he went to see infectious disease and is been put on clindamycin 150 mg daily, I did not verify the dose although I'm not familiar with using clindamycin in this dosing range, perhaps for prophylaxisoo 06/27/17; I brought this patient back today to follow-up on the wound deterioration on the right lower leg together with surrounding cellulitis. I started him on doxycycline 4 days ago. This area looks better however he comes in today with intense cellulitis on the medial part of his left thigh. This is not have a wound in this area. Extremely tender. We've been using silver alginate to the wounds on the right lower leg left lower leg with bilateral 4 layer compression he is using his external compression pumps once a day 07/04/17; patient's left medial thigh cellulitis looks better. He has not been using his compression pumps as his insert said it was contraindicated with cellulitis. His right leg continues to make improvements all the wounds are still open. We only have one remaining wound on the left posterior calf. Using silver alginate to all open areas. He is on doxycycline which I started a week ago and should be finishing I gave him Augmentin after Thursday's visit for the severe cellulitis on the left medial thigh which fortunately looks better 07/14/17; the patient's left medial thigh cellulitis has resolved. The cellulitis in his right lower calf on the right also looks better. All of his wounds are stable to improved we've been using silver alginate he has completed the antibiotics I have given him. He has clindamycin 150 mg  once a day prescribed by infectious disease for prophylaxis, I've advised him to start this now. We have been using bilateral Unna boots over silver alginate to the wound areas 07/21/17; the patient is been to see infectious disease who noted his recurrent problems with cellulitis. He was not  able to tolerate prophylactic clindamycin therefore he is on amoxicillin 500 twice a day. He also had a second daily dose of Lasix added By Dr. Oneta Rack but he is not taking this. Nor is he being completely compliant with his compression pumps a especially not this week. He has 2 remaining wounds one on the right posterior lateral lower leg and one on the left posterior medial lower leg. 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 The patient has Unna boots bilaterally. 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 He has bilateral Unna boots for compression and silver alginate to his wounds. Arrives today with his legs looking as good as I have seen him in quite some time. Not surprisingly his wounds look better as well with improvement on the right lateral leg venous insufficiency wound and also the left medial leg. He is still using the compression pumps once a day 08/11/17; both legs appear to be doing better wounds on the right lateral and left medial legs look better. Skin on the right leg quite good. He is been using silver alginate as the primary dressing. I'm going to use Anasept gel calcium alginate and maintain all the secondary dressings 08/18/17; the patient continues to actually do quite well. The area on his right lateral leg is just about closed the left medial also looks better although it is still moist in this area. His edema is well controlled we have been using Anasept  gel with calcium alginate and the usual secondary dressings, 4 layer compression and once daily use of his compression pumps "always been able to manage 09/01/17; the patient continues to do reasonably well in spite of his trip to [place]. The area on the right lateral leg is epithelialized. Left is much better but still open. He has more edema and more chronic erythema on the left leg [venous inflammation] 09/08/17; he arrives today with no open wound on the right lateral leg and decently controlled edema. Unfortunately his left leg is not nearly as in his good situation as last week.he apparently had increasing edema starting on Saturday. He edema soaked through into his foot so used a plastic bag to walk around his home. The area on the medial right leg which was his open area is about the same however he has lost surface epithelium on the left lateral which is new and he has significant pain in the Achilles area of the left foot. He is already on amoxicillin chronically for prophylaxis of cellulitis in the left leg 09/15/17; he is completed a week of doxycycline and the cellulitis in the left posterior leg and Achilles area is as usual improved. He still has a lot of edema and fluid soaking through his dressings. There is no open wound on the right leg. He saw infectious disease NP today 09/22/17;As usual 1 we transition him from our compression wraps to his stockings things did not go well. He has several small open areas on the right leg. He states this was caused by the compression wrap on his skin although he did not wear this with the stockings over them. He has several superficial areas on the left leg medially laterally posteriorly. He does not have any evidence of active cellulitis especially involving the left Achilles The patient is traveling  from [facility] Saturday going to [facility]. He states he isn't attempting to get an appointment with a heel objects wound center there to  change his dressings. I am not completely certain whether this will work 10/06/17; the patient came in on Friday for a nurse visit and the nurse reported that his legs actually look quite good. He arrives in clinic today for his regular follow-up visit. He has a new wound on his left third toe over the PIP probably caused by friction with his footwear. He has small areas on the left leg and a very superficial but epithelialized area on the right anterior lateral lower leg. Other than that his legs look as good as I've seen him in quite some time. We have been using silver alginate Review of systems; no chest pain no shortness of breath other than this a 10 point review of systems negative 10/20/17; seen by Dr. Meyer Russel last week. He had taken some antibiotics [doxycycline] that he had left over. Dr. Meyer Russel thought he had candida infection and declined to give him further antibiotics. He has a small wound remaining on the right lateral leg several areas on the left leg including a larger area on the left posterior several left medial and anterior and a small wound on the left lateral. The area on the left dorsal third toe looks a lot better. ROS; Gen.; no fever, respiratory no cough no sputum Cardiac no chest pain other than this 10 point review of system is negative 10/30/17; patient arrives today having fallen in the bathtub 3 days ago. It took him a while to get up. He has pain and maceration in the wounds on his left leg which have deteriorated. He has not been using his pumps he also has some maceration on the right lateral leg. 11/03/17; patient continues to have weeping edema especially in the left leg. This saturates his dressings which were just put on on 12/27. As usual the doxycycline seems to take care of the cellulitis on his lower leg. He is not complaining of fever, chills, or other systemic symptoms. He states his leg feels a lot better on the doxycycline I gave him empirically. He  also apparently gets injections at his primary doctor's officeo Rocephin for cellulitis prophylaxis. I didn't ask him about his compression pump compliance today I think that's probably marginal. Arrives in the clinic with all of his dressings primary and secondary macerated full of fluid and he has bilateral edema 11/10/17; the patient's right leg looks some better although there is still a cluster of wounds on the right lateral. The left leg is inflamed with almost circumferential skin loss medially to laterally although we are still maintaining anteriorly. He does not have overt cellulitis there is a lot of drainage. He is not using compression pumps. We have been using silver alginate to the wound areas, there are not a lot of options here 11/17/17; the patient's right leg continues to be stable although there is still open wounds, better than last week. The inflammation in the left leg is better. Still loss of surface layer epithelium especially posteriorly. There is no overt cellulitis in the amount of edema and his left leg is really quite good, tells me he is using his compression pumps once a day. 11/24/17; patient's right leg has a small superficial wound laterally this continues to improve. The inflammation in the left leg is still improving however we have continuous surface layer epithelial loss posteriorly. There is no overt  cellulitis in the amount of edema in both legs is really quite good. He states he is using his compression pumps on the left leg once a day for 5 out of 7 days 12/01/17; very small superficial areas on the right lateral leg continue to improve. Edema control in both legs is better today. He has continued loss of surface epithelialization and left posterior calf although I think this is better. We have been using silver alginate with large number of absorptive secondary dressings 4 layer on the left Unna boot on the right at his request. He tells me he is using his  compression pumps once a day 12/08/17; he has no open area on the right leg is edema control is good here. ooOn the left leg however he has marked erythema and tenderness breakdown of skin. He has what appears to be a wrap injury just distal to the popliteal fossa. 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 There was swelling erythema and breakdown in the right leg. He has much worse skin breakdown in the left leg as well multiple open areas medially and posteriorly as well as laterally. He tells me he has been using his compression pumps but tells me he feels that the drainage out of his leg is worse when he uses a compression pumps. To be fair to him he is been saying this for a while however I don't know that I have really been listening to this. I wonder if the compression pumps are working properly 12/22/17;. Once again he arrives with severe erythema, weeping edema from the left greater than right leg. Noncompliance with compression pumps. New this visit he is complaining of pain on the lateral aspect of the right leg and the medial aspect of his right thigh. He apparently saw his cardiologist Dr. [name] who was ordered an echocardiogram area and I think this is a step in the right direction 12/25/17; started his doxycycline Monday night. There is still intense erythema of the right leg especially in the anterior thigh although there is less tenderness. The erythema around the wound on the right lateral calf also is less tender. He still complaining of pain in the left heel. His wounds are about the same right lateral left medial left lateral. Superficial but certainly not close to closure. He denies being systemically  unwell no fever chills no abdominal pain no diarrhea 12/29/17; back in follow-up of his extensive right calf and right thigh cellulitis. I added amoxicillin to cover possible doxycycline resistant strep. This seems to of done the trick he is in much less pain there is much less erythema and swelling. He has his echocardiogram at 11:00 this morning. X-ray of the left heel was also negative. 01/05/18; the patient arrived with his edema under much better control. Now that he is retired he is able to use his compression pumps daily and sometimes twice a day per the patient. He has a wound on the right leg the lateral wound looks better. Area on the left leg also looks a lot better. He has no evidence of cellulitis in his bilateral thighs I had a quick peak at his echocardiogram. He is in normal ejection fraction and normal left ventricular function. He  has moderate pulmonary hypertension moderately reduced right ventricular function. One would have to wonder about chronic sleep apnea although he says he doesn't snore. He'll review the echocardiogram with his cardiologist. 01/12/18; the patient arrives with the edema in both legs under exemplary control. He is using his compression pumps daily and sometimes twice daily. His wound on the right lateral leg is just about closed. He still has some weeping areas on the posterior left calf and lateral left calf although everything is just about closed here as well. I have spoken with Aldean Baker who is the patient's nurse practitioner and infectious disease. She was concerned that the patient had not understood that the parenteral penicillin injections he was receiving for cellulitis prophylaxis was actually benefiting him. I don't think the patient actually saw that I would tend to agree we were certainly dealing with less infections although he had a serious one last month. 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 He is  healed. He'll be placed in juxtalite compression wraps and increase his lymphedema pumps to twice daily. We will follow up again next week to ensure there are no issues with the new regiment. 01/20/18-he is here for evaluation of bilateral lower extremity weeping edema. Yesterday he was placed in compression wrap to the right lower extremity and compression stocking to left lower shrubbery. He states he uses lymphedema pumps last night and again this morning and noted a blister to the left lower extremity. On exam he was noted to have drainage to the right lower extremity. He will be placed in Unna boots bilaterally and follow-up next week 01/26/18; patient was actually discharged a week ago to his own juxta light stockings only to return the next day with bilateral lower extremity weeping edema.he was placed in bilateral Unna boots. He arrives today with pain in the back of his left leg. There is no open area on the right leg however there is a linear/wrap injury on the left leg and weeping edema on the left leg posteriorly. I spoke with infectious disease about 10 days ago. They were disappointed that the patient elected to discontinue prophylactic intramuscular penicillin shots as they felt it was particularly beneficial in reducing the frequency of his cellulitis. I discussed this with the patient today. He does not share this view. He'll definitely need antibiotics today. Finally he is traveling to [place] and trauma leaving this Saturday and returning a week later and he does not travel with his pumps. He is going by car 01/30/18; patient was seen 4 days ago and brought back in today for review of cellulitis in the left leg posteriorly. I put him on amoxicillin this really hasn't helped as much as I might like. He is also worried because he is traveling to [facility] by car. Finally we will be rewrapping him. There is no open area on the right leg over his left leg has multiple weeping  areas as usual 02/09/18; The same wrap on for 10 days. He did not pick up the last doxycycline I prescribed for him. He apparently took 4 days worth he already had. There is nothing open on his right leg and the edema control is really quite good. He's had damage in the left leg medially and laterally especially probably related to the prolonged use of Unna boots 02/12/18; the patient arrived in clinic today for a nurse visit/wrap change. He complained of a lot of pain in the left posterior calf. He  is taking doxycycline that I previously prescribed for him. Unfortunately even though he used his stockings and apparently used to compression pumps twice a day he has weeping edema coming out of the lateral part of his right leg. This is coming from the lower anterior lateral skin area. 02/16/18; the patient has finished his doxycycline and will finish the amoxicillin 2 days. The area of cellulitis in the left calf posteriorly has resolved. He is no longer having any pain. He tells me he is using his compression pumps at least once a day sometimes twice. 02/23/18; the patient finished his doxycycline and Amoxil last week. On Friday he noticed a small erythematous circle about the size of a quarter on the left lower leg just above his ankle. This rapidly expanded and he now has erythema on the lateral and posterior part of the thigh. This is bright red. Also has an area on the dorsal foot just above his toes and a tender area just below the left popliteal fossa. He came off his prophylactic penicillin injections at his own insistence one or 2 months ago. This is obviously deteriorated since then 03/02/18; patient is on doxycycline and Amoxil. Culture I did last week of the weeping area on the back of his left calf grew group B strep. I have therefore renewed the amoxicillin 500 3 times a day for a further week. He has not been systemically unwell. Still complaining of an area of discomfort right under his  left popliteal fossa. There is no open wound on the right leg. He tells me that he is using his pumps twice a day on most days 03/09/18; patient arrives in clinic today completing his amoxicillin today. The cellulitis on his left leg is better. Furthermore he tells me that he had intramuscular penicillin shots that his primary care office today. However he also states that the wrap on his right leg fell down shortly after leaving clinic last week. He developed a large blister that was present when he came in for a nurse visit later in the week and then he developed intense discomfort around this area.He tells me he is using his compression pumps 03/16/18; the patient has completed his doxycycline. The infectious part of this/cellulitis in the left heel area left popliteal area is a lot better. He has 2 open areas on the right calf. Still areas on the left calf but this is a lot better as well. 03/24/18; the patient arrives complaining of pain in the left popliteal area again. He thinks some of this is wrap injury. He has no open area on the right leg and really no open area on the left calf either except for the popliteal area. He claims to be compliant with the compression pumps 03/31/18; I gave him doxycycline last week because of cellulitis in the left popliteal area. This is a lot better although the surface epithelium is denuded off and response to this. He arrives today with uncontrolled edema in the right calf area as well as a fingernail injury in the right lateral calf. There is only a few open areas on the left 04/06/18; I gave him amoxicillin doxycycline over the last 2 weeks that the amoxicillin should be completing currently. He is not complaining of any pain or systemic symptoms. The only open areas see has is on the right lateral lower leg paradoxically I cannot see anything on the left lower leg. He tells me he is using his compression pumps twice a day on most  days. Silver alginate to  the wounds that are open under 4 layer compression 04/13/18; he completed antibiotics and has no new complaints. Using his compression pumps. Silver alginate that anything that's opened 04/20/18; he is using his compression pumps religiously. Silver alginate 4 layer compression anything that's opened. He comes in today with no open wounds on the left leg but 3 on the right including a new one posteriorly. He has 2 on the right lateral and one on the right posterior. He likes Unna boots on the right leg for reasons that aren't really clear we had the usual 4 layer compression on the left. It may be necessary to move to the 4 layer compression on the right however for now I left them in the Unna boots 04/27/18; he is using his compression pumps at least once a day. He has still the wounds on the right lateral calf. The area right posteriorly has closed. He does not have an open wound on the left under 4 layer compression however on the dorsal left foot just proximal to the toes and the left third toe 2 small open areas were identified 05/11/18; he has not uses compression pumps. The areas on the right lateral calf have coalesced into one large wound necrotic surface. On the left side he has one small wound anteriorly however the edema is now weeping out of a large part of his left leg. He says he wasn't using his pumps because of the weeping fluid. I explained to him that this is the time he needs to pump more 05/18/18; patient states he is using his compression pumps twice a day. The area on the right lateral large wound albeit superficial. On the left side he has innumerable number of small new wounds on the left calf particularly laterally but several anteriorly and medially. All these appear to have healthy granulated base these look like the remnants of blisters however they occurred under compression. The patient arrives in clinic today with his legs somewhat better. There is certainly less edema,  less multiple open areas on the left calf and the right anterior leg looks somewhat better as well superficial and a little smaller. However he relates pain and erythema over the last 3-4 days in the thigh and I looked at this today. He has not been systemically unwell no fever no chills no change in blood sugar values 05/25/18; comes in today in a better state. The severe cellulitis on his left leg seems better with the Keflex. Not as tender. He has not been systemically unwell ooHard to find an open wound on the left lower leg using his compression pumps twice a day ooThe confluent wounds on his right lateral calf somewhat better looking. These will ultimately need debridement I didn't do this today. 06/01/18; the severe cellulitis on the left anterior thigh has resolved and he is completed his Keflex. ooThere is no open wound on the left leg however there is a superficial excoriation at the base of the third toe dorsally. Skin on the bottom of his left foot is macerated looking. ooThe left the wounds on the lateral right leg actually looks some better although he did require debridement of the top half of this wound area with an open curet 06/09/18 on evaluation today patient appears to be doing poorly in regard to his right lower extremity in particular this appears to likely be infected he has very thick purulent discharge along with a bright green tent to the discharge. This  makes me concerned about the possibility of pseudomonas. He's also having increased discomfort at this point on evaluation. Fortunately there does not appear to be any evidence of infection spreading to the other location at this time. 06/16/18 on evaluation today patient appears to actually be doing fairly well. His ulcer has actually diminished in size quite significantly at this point which is good news. Nonetheless he still does have some evidence of infection he did see infectious disease this morning before coming  here for his appointment. I did review the results of their evaluation and their note today. They did actually have him discontinue the Cipro and initiate treatment with linezolid at this time. He is doing this for the next seven days and they recommended a follow-up in four months with them. He is the keep a log of the need for intermittent antibiotic therapy between now and when he falls back up with infectious disease. This will help them gaze what exactly they need to do to try and help them out. 06/23/18; the patient arrives today with no open wounds on the left leg and left third toe healed. He is been using his compression pumps twice a day. On the right lateral leg he still has a sizable wound but this is a lot better than last time I saw this. 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 Has been using silver alginate under 4 layer compression 06/30/18; the only open wound he has is on the right lateral leg and this looks healthy. No debridement is required. We have been using silver alginate. He does not have an open wound on the left leg. There is apparently some drainage from the dorsal proximal third toe on the left although I see no open wound here. 07/03/18 on evaluation today patient was actually here just for a nurse visit rapid change. However when he was here on Wednesday for his rat change due to having been healed on the left and then developing blisters we initiated the wrap again knowing that he would be back today for [place] to reevaluate and see were at. Unfortunately he has developed some cellulitis into the proximal portion of his right lower extremity even into the region of his thigh. 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 He was placed on one as what at that point. Nonetheless he is done with that and has been tolerating it well  otherwise. Doxycycline which in the past really did not seem to be effective for him. Nonetheless I think the best option may be for [place] to definitely reinitiate the antibiotics for a longer period of time. 07/07/18; since I last saw this patient a week ago he has had a difficult time. At that point he did not have an open wound on his left leg. We transitioned him into juxta light stockings. He was apparently in the clinic the next day with blisters on the left lateral and left medial lower calf. He also had weeping edema fluid. He was put back into a compression wrap. He was also in the clinic on Friday with intense erythema in his right thigh. Per the patient he was started on Bactrim however that didn't work at all in terms of relieving his pain and swelling. He has taken 3 doxycycline that he had left over from last time and that seems to  of helped. He has blistering on the right thigh as well. 07/14/18; the erythema on his right thigh has gotten better with doxycycline that he is finishing. 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 Presumably this cellulitis in the thigh was not related to that although I think this is a bit concerning going forward. He still has an area on the right lateral calf the blister on the right medial calf just below the knee that was discussed above. On the left 2 small open areas left medial and left lateral. Edema control is adequate. He is using his compression pumps twice a day 07/20/18; continued improvement in the condition of both legs especially the edema in his bilateral thighs. He tells me he is been losing weight through a combination of diet and exercise. He is using his compression pumps twice a day. So overall she made to the remaining wounds 07/27/2018; continued improvement in condition of both legs. His edema is well controlled. The area on the right lateral leg is just about  closed he had one blisters show up on the medial left upper calf. We have him in 4 layer compression. He is going on a 10-day trip to [place], [place] and [place]. He will be driving. He wants to wear Unna boots because of the lessening amount of constriction. He will not use compression pumps while he is away 08/05/18 on evaluation today patient actually appears to be doing decently well all things considered in regard to his bilateral lower extremities. The worst ulcer is actually only posterior aspect of his left lower extremity with a four layer compression wrap cut into his leg a couple weeks back. He did have a trip and actually had [employer] for the trip that he is worn since he was last here. Nonetheless he feels like the [employer] actually do better for him his swelling is up a little bit but he also with his trip was not taking his Lasix on a regular set schedule like he was supposed to be. He states that obviously the reason being that he cannot drive and keep going without having to urinate too frequently which makes it difficult. He did not have his pumps with him while he was away either which I think also maybe playing a role here too. 08/13/2018; the patient only has a small open wound on the right lateral calf which is a big improvement in the last month or 2. He also has the area posteriorly just below the posterior fossa on the left which I think was a wrap injury from several weeks ago. He has no current evidence of cellulitis. He tells me he is back into his compression pumps twice a day. He also tells me that while he was at the laundromat somebody stole a section of his extremitease stockings 08/20/2018; back in the clinic with a much improved state. He only has small areas on the right lateral mid calf which is just about healed. This was is more substantial area for quite a prolonged period of time. He has a small open area on the left anterior tibia. The  area on the posterior calf just below the popliteal fossa is closed today. He is using his compression pumps twice a day 08/28/2018; patient has no open wound on the right leg. He has a smattering of open areas on the calf with some weeping lymphedema.  More problematically than that it looks as though his wraps of slipped down in his usual he has very angry upper area of edema just below the right medial knee and on the right lateral calf. He has no open area on his feet. The patient is traveling to [facility] next week. I will send him in an antibiotic. We will continue to wrap the right leg. We ordered extremitease stockings for him last week and I plan to transition the right leg to a stocking when he gets home which will be in 10 days time. As usual he is very reluctant to take his pumps with him when he travels 09/07/2018; patient returns from [facility]. He shows me a picture of his left leg in the mid part of his trip last week with intense fire engine erythema. The picture look bad enough I would have considered sending him to the hospital. Instead he went to the wound care center in [facility]. They did not prescribe him antibiotics but he did take some doxycycline he had leftover from a previous visit. I had given him trimethoprim sulfamethoxazole before he left this did not work according to the patient. This is resulted in some improvement fortunately. He comes back with a large wound on the left posterior calf. Smaller area on the left anterior tibia. Denuded blisters on the dorsal left foot over his toes. Does not have much in the way of wounds on the right leg although he does have a very tender area on the right posterior area just below the popliteal fossa also suggestive of infection. He promises me he is back on his pumps twice a day 09/15/2018; the intense cellulitis in his left lower calf is a lot better. The wound area on the posterior left calf is also so  better. However he has reasonably extensive wounds on the dorsal aspect of his second and third toes and the proximal foot just at the base of the toes. There is nothing open on the right leg 09/22/2018; the patient has excellent edema control in his legs bilaterally. He is using his external compression pumps twice a day. He has no open area on the right leg and only the areas in the left foot dorsally second and third toe area on the left side. He does not have any signs of active cellulitis. 10/06/2018; the patient has good edema control bilaterally. He has no open wound on the right leg. There is a blister in the posterior aspect of his left calf that we had to deal with today. He is using his compression pumps twice a day. There is no signs of active cellulitis. We have been using silver alginate to the wound areas. He still has vulnerable areas on the base of his left first second toes dorsally He has a his extremities stockings and we are going to transition him today into the stocking on the right leg. He is cautioned that he will need to continue to use the compression pumps twice a day. If he notices uncontrolled edema in the right leg he may need to go to 3 times a day. 10/13/2018; the patient came in for a nurse check on Friday he has a large flaccid blister on the right medial calf just below the knee. We unroofed this. He has this and a new area underneath the posterior mid calf which was undoubtedly a blister as well. He also has several small areas on the right which is the area  we put his extremities stocking on. 10/19/2018; the patient went to see infectious disease this morning I am not sure if that was a routine follow-up in any case the doxycycline I had given him was discontinued and started on linezolid. He has not started this. It is easy to look at his left calf and the inflammation and think this is cellulitis however he is very tender in the tissue just below the  popliteal fossa and I have no doubt that there is infection going on here. He states the problem he is having is that with the compression pumps the edema goes down and then starts walking the wrap falls down. We will see if we can adhere this. He has 1 or 2 minuscule open areas on the right still areas that are weeping on the posterior left calf, the base of his left second and third toes 10/26/18; back today in clinic with quite of skin breakdown in his left anterior leg. This may have been infection the area below the popliteal fossa seems a lot better however tremendous epithelial loss on the left anterior mid tibia area over quite inexpensive tissue. He has 2 blisters on the right side but no other open wound here. 10/29/2018; came in urgently to see [place] today and we worked him in for review. He states that the 4 layer compression on the right leg caused pain he had to cut it down to roughly his mid calf this caused swelling above the wrap and he has blisters and skin breakdown today. As a result of the pain he has not been using his pumps. Both legs are a lot more edematous and there is a lot of weeping fluid. 11/02/18; arrives in clinic with continued difficulties in the right leg> left. Leg is swollen and painful. multiple skin blisters and new open areas especially laterally. He has not been using his pumps on the right leg. 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 He is not systemically unwell. 11/09/2018; the patient claims he is being compliant with his pumps. He is finished the doxycycline I gave him last week. Culture I did of the wound on the right lateral leg showed a few very resistant methicillin staph aureus. This was resistant to doxycycline. Nevertheless he states the pain in the leg is a lot better which makes me wonder if the cultured organism was not really what was causing the problem nevertheless this is a very dangerous  organism to be culturing out of any wound. His right leg is still a lot larger than the left. He is using an Radio broadcast assistant on this area, he blames a 4-layer compression for causing the original skin breakdown which I doubt is true however I cannot talk him out of it. We have been using silver alginate to all of these areas which were initially blisters 11/16/2018; patient is being compliant with his external compression pumps at twice a day. Miraculously he arrives in clinic today with absolutely no open wounds. He has better edema control on the left where he has been using 4 layer compression versus wound of wounds on the right and I pointed this out to him. There is no inflammation in the skin in his lower legs which is also somewhat unusual for him. There is no open wounds on the dorsal left foot. He has extremitease stockings at home and I have asked him to bring these in next week. 11/25/18 patient's lower extremity on  examination today on the left appears for the most part to be wound free. He does have an open wound on the lateral aspect of the right lower extremity but this is minimal compared to what I've seen in past. He does request that we go ahead and wrap the left leg as well even though there's nothing open just so hopefully it will not reopen in short order. 1/28; patient has superficial open wounds on the right lateral calf left anterior calf and left posterior calf. His edema control is adequate. 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 We have been using silver alginate as the primary dressing. He claims compliance with his compression pumps 2/4; patient has superficial open wounds on numerous areas of his left calf and again one on the left dorsal foot. The areas on the right lateral calf have healed. The cellulitis that I gave him doxycycline for last week is also resolved this was mostly on  the left anterior calf just below the tibial tuberosity. His edema looks fairly well-controlled. He tells me he went to see his primary doctor today and had blood work ordered 2/11; once again he has several open areas on the left calf left tibial area. Most of these are small and appear to have healthy granulation. He does not have anything open on the right. The edema and control in his thighs is pretty good which is usually a good indication he has been using his pumps as requested. 2/18; he continues to have several small areas on the left calf and left tibial area. Most of these are small healthy granulation. We put him in his stocking on the right leg last week and he arrives with a superficial open area over the right upper tibia and a fairly large area on the right lateral tibia in similar condition. His edema control actually does not look too bad, he claims to be using his compression pumps twice a day 2/25. Continued small areas on the left calf and left tibial area. New areas especially on the right are identified just below the tibial tuberosity and on the right upper tibia itself. There are also areas of weeping edema fluid even without an obvious wound. He does not have a considerable degree of lymphedema but clearly there is more edema here than his skin can handle. He states he is using the pumps twice a day. We have an Unna boot on the right and 4 layer compression on the left. 3/3; he continues to have an area on the right lateral calf and right posterior calf just below the popliteal fossa. There is a fair amount of tenderness around the wound on the popliteal fossa but I did not see any evidence of cellulitis, could just be that the wrap came down and rubbed in this area. ooHe does not have an open area on the left leg however there is an area on the left dorsal foot at the base of the third toe ooWe have been using silver alginate to all wound areas 3/10; he did not have  an open area on his left leg last time he was here a week ago. Today he arrives with a horizontal wound just below the tibial tuberosity and an area on the left lateral calf. He has intense erythema and tenderness in this area. The area is on the right lateral calf and right posterior calf better than last  week. We have been using silver alginate as usual 3/18 - Patient returns with 3 small open areas on left calf, and 1 small open area on right calf, the skin looks ok with no significant erythema, he continues the UNA boot on right and 4 layer compression on left. The right lateral calf wound is closed , the right posterior is small area. we will continue silver alginate to the areas. 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 He is been tolerating the dressing changes without complication. Fortunately she has made excellent improvement in regard to the overall status of his wounds. Unfortunately every time we cease wrapping him he ends up reopening in causing more significant issues at that point. Again I'm unsure of the best direction to take although I think the lymphedema clinic may be appropriate for him. 02/03/19 on evaluation today patient appears to be doing well in regard to the wounds that we saw him for last week unfortunately he has a new area on the proximal portion of his right medial/posterior lower extremity where the wrap somewhat slowed down and caused swelling and a blister to rub and open. Unfortunately this is the only opening that he has on either leg at this point. 02/17/19 on evaluation today patient's bilateral lower extremities appear to be doing well. He still completely healed in regard to the left lower extremity. In regard to the right lower extremity the area where the wrap  and slid down and caused the blister still seems to be slightly open although this is dramatically better than during the last evaluation two weeks ago. I'm very pleased with the way this stands overall. 03/03/19 on evaluation today patient appears to be doing well in regard to his right lower extremity in general although he did have a new blister open this does not appear to be showing any evidence of active infection at this time. Fortunately there's No fevers, chills, nausea, or vomiting noted at this time. Overall I feel like he is making good progress it does feel like that the right leg will we perform the [employer] seems to do with a bit better than three layer wrap on the left which slid down on him. We may switch to doing bilateral in the book wraps. 5/4; I have not seen [name] in quite some time. According to our case manager he did not have an open wound on his left leg last week. He had 1 remaining wound on the right posterior medial calf. He arrives today with multiple openings on the left leg probably were blisters and/or wrap injuries from Unna boots. I do not think the Unna boot's will provide adequate compression on the left. I am also not clear about the frequency he is using the compression pumps. 03/17/19 on evaluation today patient appears to be doing excellent in regard to his lower extremities compared to last week's evaluation apparently. He had gotten significantly worse last week which is unfortunate. The [employer] wrap on the left did not seem to do very well for him at all and in fact it didn't control his swelling significantly enough he had an additional outbreak. Subsequently we go back to the four layer compression wrap on the left. This is good news. At least in that he is doing better and the  wound seem to be killing him. He still has not heard anything from the lymphedema clinic. 03/24/19 on evaluation today patient actually appears to be doing much better in  regard to his bilateral lower Trinity as compared to last week when I saw him. Fortunately there's no signs of active infection at this time. He has been tolerating the dressing changes without complication. Overall I'm extremely pleased with the progress and appearance in general. 04/07/19 on evaluation today patient appears to be doing well in regard to his bilateral lower extremities. His swelling is significantly down from where it was previous. With that being said he does have a couple blisters still open at this point but fortunately nothing that seems to be too severe and again the majority of the larger openings has healed at this time. 04/14/19 on evaluation today patient actually appears to be doing quite well in regard to his bilateral lower extremities in fact I'm not even sure there's anything significantly open at this time at any site. Nonetheless he did have some trouble with these wraps where they are somewhat irritating him secondary to the fact that he has noted that the graph wasn't too close down to the end of this foot in a little bit short as well up to his knee. Otherwise things seem to be doing quite well. 04/21/19 upon evaluation today patient's wound bed actually showed evidence of being completely healed in regard to both lower extremities which is excellent news. There does not appear to be any signs of active infection which is also good news. I'm very pleased in this regard. No fevers, chills, nausea, or vomiting noted at this time. 04/28/19 on evaluation today patient appears to be doing a little bit worse in regard to both lower extremities on the left mainly due to the fact that when he went infection disease the wrap was not wrapped quite high enough he developed a blister above this. On the right he is a small open area of nothing too significant but again this is continuing to give him some trouble he has been were in the Velcro compression that he has at  home. 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 He's been tolerating the dressing changes without complication. Fortunately there's no signs of active infection at this time. No fevers, chills, nausea, or vomiting noted at this time. We have been trying to get an appointment with her lymphedema clinic in [facility] but unfortunately nobody can get them on phone with not been able to even fax information over the patient likewise is not been able to get in touch with them. Overall I'm not sure exactly what's going on here with to reach out again today. 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 Still having a lot of drainage unfortunately. He tells me especially in the left but even on the right. There's no signs of active infection which is good news we've been using so ratcheted up to this point. 05/19/19 on evaluation today patient actually appears to be doing quite well with regard to his left lower extremity which is great news. Fortunately in regard to the right lower extremity has an issues with his wrap and he subsequently did remove this from what I'm understanding. Nonetheless long story short is what he had rewrapped once he removed it subsequently had maggots underneath this wrap  whenever he came in for evaluation today. With that being said they were obviously completely cleaned away by the nursing staff. The visit today which is excellent news. However he does appear to potentially have some infection around the right ankle region where the maggots were located as well. He will likely require anabiotic therapy today. 05/26/19 on evaluation today patient actually appears to be doing much better in regard to his bilateral lower extremities. I feel like the infection is under much better control. With that being said there were  maggots noted when the wrap was removed yet again today. Again this could have potentially been left over from previous although at this time there does not appear to be any signs of significant drainage there was obviously on the wrap some drainage as well this contracted gnats or otherwise. Either way I do not see anything that appears to be doing worse in my pinion and in fact I think his drainage has slowed down quite significantly likely mainly due to the fact to his infection being under better control. 06/02/2019 on evaluation today patient actually appears to be doing well with regard to his bilateral lower extremities there is no signs of active infection at this time which is great news. With that being said he does have several open areas more so on the right than the left but nonetheless these are all significantly better than previously noted. 06/09/2019 on evaluation today patient actually appears to be doing well. His wrap stayed up and he did not cause any problems he had more drainage on the right compared to the left but overall I do not see any major issues at this time which is great news. 06/16/2019 on evaluation today patient appears to be doing excellent with regard to his lower extremities the only area that is open is a new blister that can have opened as of today on the medial ankle on the left. Other than this he really seems to be doing great I see no major issues at this point. 06/23/2019 on evaluation today patient appears to be doing quite well with regard to his bilateral lower extremities. In fact he actually appears to be almost completely healed there is a small area of weeping noted of the right lower extremity just above the ankle. Nonetheless fortunately there is no signs of active infection at this time which is good news. No fevers, chills, nausea, vomiting, or diarrhea. 8/24; the patient arrived for a nurse visit today but complained of very significant pain in  the left leg and therefore I was asked to look at this. Noted that he did not have an open area on the left leg last week nevertheless this was wrapped. The patient states that he is not been able to put his compression pumps on the left leg because of the discomfort. He has not been systemically unwell 06/30/2019 on evaluation today patient unfortunately despite being excellent last week is doing much worse with regard to his left lower extremity today. In fact he had to come in for a nurse on Monday where his left leg had to be rewrapped due to excessive weeping Dr. Leanord Hawkingobson placed him on doxycycline at that point. Fortunately there is no signs of active infection Systemically at this time which is good news. 07/07/2019 in regard to the patient's wounds today he actually seems to be doing well with his right lower extremity there really is nothing open or draining at this point this is great news.  Unfortunately the left lower extremity is given him additional trouble at this time. There does not appear to be any signs of active infection nonetheless he does have a lot of edema and swelling noted at this point as well as blistering all of which has led to a much more poor appearing leg at this time compared to where it was 2 weeks ago when it was almost completely healed. Obviously this is a little discouraging for the patient. He is try to contact the lymphedema clinic in [place] he has not been able to get through to them. 07/14/2019 on evaluation today patient actually appears to be doing slightly better with regard to his left lower extremity ulcers. Overall I do feel like at least at the top of the wrap that we have been placing this area has healed quite nicely and looks much better. The remainder of the leg is showing signs of improvement. Unfortunately in the thigh area he still has an open region on the left and again on the right he has been utilizing just a Band-Aid on an area that also  opened on the thigh. Again this is an area that were not able to wrap although we did do an Ace wrap to provide some compression that something that obviously is a little less effective than the compression wraps we have been using on the lower portion of the leg. He does have an appointment with the lymphedema clinic in [facility] on Friday. 07/21/2019 on evaluation today patient appears to be doing better with regard to his lower extremity ulcers. He has been tolerating the dressing changes without complication. Fortunately there is no signs of active infection at this time. No fevers, chills, nausea, vomiting, or diarrhea. I did receive the paperwork from the physical therapist at the lymphedema clinic in [place]. Subsequently I signed off on that this morning and sent that back to him for further progression with the treatment plan. 07/28/2019 on evaluation today patient appears to be doing very well with regard to his right lower extremity where I do not see any open wounds at this point. Fortunately he is feeling great as far as that is concerned as well. In regard to the left lower extremity he has been having issues with still several areas of weeping and edema although the upper leg is doing better his lower leg still I think is going require the compression wrap at this time. No fevers, chills, nausea, vomiting, or diarrhea. 08/04/2019 on evaluation today patient unfortunately is having new wounds on the right lower extremity. Again we have been using Unna boot wrap on that side. We switched him to using his juxta lite wrap at home. With that being said he tells me he has been using it although his legs extremely swollen and to be honest really does not appear that he has been. I cannot know that for sure however. Nonetheless he has multiple new wounds on the right lower extremity at this time. Obviously we will have to see about getting this rewrapped for him today. 08/11/2019 on  evaluation today patient appears to be doing fairly well with regard to his wounds. He has been tolerating the dressing changes including the compression wraps without complication. He still has a lot of edema in his upper thigh regions bilaterally he is supposed to be seeing the lymphedema clinic on the 15th of this month once his wraps arrive for the upper part of his legs. 08/18/2019 on evaluation today patient appears to  be doing well with regard to his bilateral lower extremities at this point. He has been tolerating the dressing changes without complication. Fortunately there is no signs of active infection which is also good news. He does have a couple weeping areas on the first and second toe of the right foot he also has just a small area on the left foot upper leg and a small area on the left lower leg but overall he is doing quite well in my opinion. He is supposed to be getting his wraps shortly in fact tomorrow and then subsequently is seeing the lymphedema clinic next Wednesday on the 21st. Of note he is also leaving on the 25th to go on vacation for a week to the beach. For that reason and since there is some uncertainty about what there can be doing at lymphedema clinic next Wednesday I am get a make an appointment for next Friday here for [place] to see what we need to do for him prior to him leaving for vacation. 10/23; patient arrives in considerable pain predominantly in the upper posterior calf just distal to the popliteal fossa also in the wound anteriorly above the major wound. This is probably cellulitis and he has had this recurrently in the past. He has no open wound on the right side and he has had an Radio broadcast assistant in that area. Finally I note that he has an area on the left posterior calf which by enlarge is mostly epithelialized. This protrudes beyond the borders of the surrounding skin in the setting of dry scaly skin and lymphedema. The patient is leaving for [facility] on  Sunday. Per his longstanding pattern, he will not take his compression pumps with him predominantly out of fear that they will be stolen. He therefore asked that we put a Unna boot back on the right leg. He will also contact the wound care center in [facility] to see if they can change his dressing in the mid week. 11/3; patient returned from his vacation to [facility]: [facility] He was seen on 1 occasion at their wound care center. They did a 2 layer compression system as they did not have our 4-layer wrap. I am not completely certain what they put on the wounds. They did not change the Unna boot on the right. The patient is also seeing a lymphedema specialist physical therapist in [place]. It appears that he has some compression sleeve for his thighs which indeed look quite a bit better than I am used to seeing. He pumps over these with his external compression pumps. 11/10; the patient has a new wound on the right medial thigh otherwise there is no open areas on the right. He has an area on the left leg posteriorly anteriorly and medially and an area over the left second toe. We have been using silver alginate. He thinks the injury on his thigh is secondary to friction from the compression sleeve he has. Objective Constitutional Patient is hypertensive.. Pulse regular and within target range for patient.Marland Kitchen Respirations regular, non-labored and within target range.. Temperature is normal and within the target range for the patient.Marland Kitchen Appears in no distress. Vitals Time Taken: 9:56 AM, Height: 70 in, Source: Stated, Weight: 380.2 lbs, Source: Stated, BMI: 54.5, Temperature: 98.3 F, Pulse: 46 bpm, Respiratory Rate: 20 breaths/min, Blood Pressure: 164/53 mmHg, Capillary Blood Glucose: 154 mg/dl. General Notes: glucose per pt report this am Eyes Conjunctivae clear. No discharge.no icterus. Respiratory work of breathing is normal. Cardiovascular Pedal pulses  palpable and strong bilaterally..  His edema control in both legs, calfs and thighs is as good as I have ever seen it. Psychiatric appears at normal baseline. General Notes: Wound exam; right lower leg has no open area. On the right medial thigh there is a superficial area with some erythema. I think the erythema is secondary to irritation from his compression sleeve in this area. I do not think there is cellulitis ooOn the left anterior lower leg there is superficial areas as well as left lateral left medial and a more punched out area on the left second toe. ooI did not think there was cellulitis in any wound area Integumentary (Hair, Skin) Skin changes of chronic venous insufficiency with hemosiderin deposition. He has a nodular area on the left posterior calf this will need to be observed carefully over time if it enlarges it may need to be biopsied. Wound #140 status is Open. Original cause of wound was Blister. The wound is located on the Left,Posterior,Circumferential Lower Leg. The wound measures 0.5cm length x 0.5cm width x 0.1cm depth; 0.196cm^2 area and 0.02cm^3 volume. There is Fat Layer (Subcutaneous Tissue) Exposed exposed. There is no tunneling or undermining noted. There is a medium amount of serosanguineous drainage noted. The wound margin is flat and intact. There is large (67-100%) red granulation within the wound bed. There is no necrotic tissue within the wound bed. Wound #141 status is Open. Original cause of wound was Blister. The wound is located on the Left,Medial Lower Leg. The wound measures 0cm length x 0cm width x 0cm depth; 0cm^2 area and 0cm^3 volume. There is no tunneling or undermining noted. There is a none present amount of drainage noted. The wound margin is flat and intact. There is no granulation within the wound bed. There is no necrotic tissue within the wound bed. Wound #142 status is Open. Original cause of wound was Blister. The wound is located on the Left,Anterior Lower Leg. The  wound measures 1.7cm length x 1cm width x 0.1cm depth; 1.335cm^2 area and 0.134cm^3 volume. There is no tunneling or undermining noted. There is a medium amount of serosanguineous drainage noted. The wound margin is flat and intact. There is large (67-100%) pink granulation within the wound bed. There is no necrotic tissue within the wound bed. Wound #143 status is Open. Original cause of wound was Gradually Appeared. The wound is located on the Left,Lateral Lower Leg. The wound measures 0cm length x 0cm width x 0cm depth; 0cm^2 area and 0cm^3 volume. There is no tunneling or undermining noted. There is a small amount of serosanguineous drainage noted. The wound margin is flat and intact. There is no granulation within the wound bed. There is no necrotic tissue within the wound bed. Wound #144 status is Open. Original cause of wound was Gradually Appeared. The wound is located on the Left Toe Fourth. The wound measures 0cm length x 0cm width x 0cm depth; 0cm^2 area and 0cm^3 volume. There is no tunneling or undermining noted. There is a small amount of serous drainage noted. The wound margin is flat and intact. There is no granulation within the wound bed. There is no necrotic tissue within the wound bed. Wound #145 status is Open. Original cause of wound was Gradually Appeared. The wound is located on the Right,Medial Upper Leg. The wound measures 1.5cm length x 1cm width x 0.1cm depth; 1.178cm^2 area and 0.118cm^3 volume. The wound is limited to skin breakdown. There is no tunneling or undermining noted. There  is a medium amount of serous drainage noted. The wound margin is flat and intact. There is large (67-100%) pink granulation within the wound bed. There is no necrotic tissue within the wound bed. Wound #146 status is Open. Original cause of wound was Gradually Appeared. The wound is located on the Left Toe Second. The wound measures 0.4cm length x 0.7cm width x 0.1cm depth; 0.22cm^2 area  and 0.022cm^3 volume. There is Fat Layer (Subcutaneous Tissue) Exposed exposed. There is no tunneling or undermining noted. There is a medium amount of serous drainage noted. The wound margin is flat and intact. There is large (67-100%) red granulation within the wound bed. There is no necrotic tissue within the wound bed. 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 Dressing Change Frequency: Do not change entire dressing for one week. - both legs Skin Barriers/Peri-Wound Care: TCA Cream or Ointment - mix with lotion both legs Wound Cleansing: May shower with protection. Primary Wound Dressing: Wound #140 Left,Posterior,Circumferential Lower Leg: Calcium Alginate with Silver Wound #142 Left,Anterior Lower Leg: Calcium Alginate with Silver Wound #145 Right,Medial Upper Leg: Calcium Alginate with Silver Wound #146 Left Toe Second: Calcium Alginate with Silver Secondary Dressing: Wound #140 Left,Posterior,Circumferential Lower Leg: Dry Gauze ABD pad Wound #142 Left,Anterior Lower Leg: Dry Gauze ABD pad Foam Border Wound #145 Right,Medial Upper Leg: Foam Border Wound #146 Left Toe Second: Kerlix/Rolled Gauze - secure with tape Dry Gauze Edema Control: 4 layer compression: Left lower extremity - unna boot at upper portion of lower leg. Unna Boot to Right Lower Extremity Avoid standing for long periods of time Elevate legs to the level of the heart or above for 30 minutes daily and/or when sitting, a frequency  of: Exercise regularly Segmental Compressive Device. - lymphadema pumps 60 min 2 times per day 1. I have not changed the dressing which is silver alginate and 4-layer compression bilaterally with Profore on the left and Unna boot to the right lower extremity. 2. We may be able to transition him back to a stocking next week. 3. He is using his lymphedema pumps twice a day 4. Thigh compression garments have really helped I think with the amount of swelling/lymphedema in his thighs Electronic Signature(s) Signed: 09/14/2019 5:17:49 PM By: Baltazar Najjar MD Entered By: Baltazar Najjar on 09/14/2019 11:42:37 -------------------------------------------------------------------------------- SuperBill Details Patient Name: Date of Service: [name] 09/14/2019 Medical Record [id number] Patient Account Number: [id number] Date of Birth/Sex: Treating RN: 1951-01-06 (68 y.o. [name]) Yevonne Pax Primary Care Provider: Nicoletta Ba Other Clinician: Referring Provider: Treating Provider/Extender:Robson, Lottie Rater, [name] in Treatment: 190 Diagnosis Coding ICD-10 Codes Code Description 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 skin L97.221 Non-pressure chronic ulcer of left calf limited to breakdown of skin 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 of bilateral lower extremity I89.0 Lymphedema, not elsewhere classified E11.622 Type 2 diabetes mellitus with other skin ulcer 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, unspecified L03.116 Cellulitis of left lower limb Facility Procedures CPT4 Code Description: 13086578 (Facility Use Only) 29581LT - APPLY MULTLAY COMPRS LWR LT LEG Modifier: Quantity: 1 CPT4 Code  Description: [id number] (Facility Use Only) [phone] - APPLY Henriette Combs RT Modifier: Quantity: 1 Physician Procedures CPT4: Code [id number] Description: 213 - WC PHYS LEVEL 3 - EST PT  ICD-10 Diagnosis Description 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 L97.221 Non-pressure chronic ulcer of left calf limited to breakdown o I87.333 Chronic venous hypertension  (idiopathic) with ulcer and inflam lower extremity I89.0 Lymphedema, not elsewhere classified Modifier: of skin f skin mation of bilate Quantity: 1 ral Electronic Signature(s) Signed: 09/14/2019 5:17:49 PM By: Baltazar Najjar MD Signed: 10/12/2019 2:53:59 PM By: Yevonne Pax RN Entered By: Yevonne Pax on 09/14/2019 13:25:20

## 2019-10-12 NOTE — Progress Notes (Signed)
YOUSIF, EDELSON ([id number]) Visit Report for 08/23/2019 SuperBill Details Patient Name: Date of Service: JAIMES, ECKERT 08/23/2019 Medical Record [id number] Patient Account Number: [id number] Date of Birth/Sex: Treating RN: 1951/08/31 (68 y.o. [name]) [name] Primary Care Provider: [name] Other Clinician: Referring Provider: Treating Provider/Extender:Robson, Peyton Najjar, [name] in Treatment: 187 Diagnosis Coding ICD-10 Codes Code Description U44.034 Non-pressure chronic ulcer of right calf limited to breakdown of skin L97.221 Non-pressure chronic ulcer of left calf limited to breakdown of skin 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 of bilateral lower extremity I89.0 Lymphedema, not elsewhere classified E11.622 Type 2 diabetes mellitus with other skin ulcer 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, unspecified L03.116 Cellulitis of left lower limb Facility Procedures CPT4 Code Description Modifier Quantity [phone] (Facility Use Only) [phone] - APPLY MULTLAY COMPRS LWR LT LEG 1 Electronic Signature(s) Signed: 08/23/2019 5:54:03 PM By: Linton Ham MD Signed: 10/12/2019 3:01:15 PM By: [name] RN Entered By: [name] on 08/23/2019 17:07:26

## 2019-10-12 NOTE — Progress Notes (Signed)
LEBARON, BAUTCH ([id number]) Visit Report for 08/11/2019 Arrival Information Details Patient Name: Date of Service: [name], [name] 08/11/2019 10:30 AM Medical Record [id number] Patient Account Number: [id number] Date of Birth/Sex: Treating RN: 1951/05/18 (68 y.o. [name]) [name] Primary Care Provider: [name] Other Clinician: Referring Provider: Treating Provider/Extender:[name], Dema Severin, [name] in Treatment: 88 Visit Information History Since Last Visit Walker All ordered tests and consults were completed: No Patient Arrived: Added or deleted any medications: No Arrival Time: 11:03 Any new allergies or adverse reactions: No Accompanied By: self Had a fall or experienced change in No Transfer Assistance: None activities of daily living that may affect Patient Identification Verified: Yes risk of falls: Secondary Verification Process Completed: Yes Signs or symptoms of abuse/neglect since last No Patient Requires Transmission-Based No visito Precautions: Hospitalized since last visit: No Patient Has Alerts: Yes Implantable device outside of the clinic excluding No cellular tissue based products placed in the center since last visit: Has Dressing in Place as Prescribed: Yes Has Compression in Place as Prescribed: Yes Pain Present Now: No Electronic Signature(s) Signed: 10/12/2019 2:59:38 PM By: [name] RN Entered By: [name] on 08/11/2019 11:04:30 -------------------------------------------------------------------------------- Compression Therapy Details Patient Name: Date of Service: [name], [name] 08/11/2019 10:30 AM Medical Record [id number] Patient Account Number: [id number] Date of Birth/Sex: Treating RN: September 07, 1951 (68 y.o. [name] Primary Care Provider: [name] Other Clinician: Referring Provider: Treating Provider/Extender:[name], Dema Severin, [name] in Treatment: 185 Compression Therapy  Performed for Wound Wound #137 Right,Proximal,Medial Lower Leg Assessment: Performed By: Clinician Deon Pilling, RN Compression Type: [name] Post Procedure Diagnosis Same as Pre-procedure Electronic Signature(s) Signed: 08/12/2019 12:09:12 PM By: Baruch Gouty RN, BSN Entered By: Baruch Gouty on 08/11/2019 11:57:01 -------------------------------------------------------------------------------- Compression Therapy Details Patient Name: Date of Service: [name] 08/11/2019 10:30 AM Medical Record [id number] Patient Account Number: [id number] Date of Birth/Sex: Treating RN: 1951/06/23 (68 y.o. [name] Primary Care Provider: [name] Other Clinician: Referring Provider: Treating Provider/Extender:[name], Dema Severin, [name] in Treatment: 185 Compression Therapy Performed for Wound NonWound Condition Lymphedema - Left Leg Assessment: Performed By: Clinician Deon Pilling, RN Compression Type: Four Layer Post Procedure Diagnosis Same as Pre-procedure Electronic Signature(s) Signed: 08/12/2019 12:09:12 PM By: Baruch Gouty RN, BSN Entered By: Baruch Gouty on 08/11/2019 11:57:28 -------------------------------------------------------------------------------- Encounter Discharge Information Details Patient Name: Date of Service: [name] 08/11/2019 10:30 AM Medical Record [id number] Patient Account Number: [id number] Date of Birth/Sex: Treating RN: 04-25-1951 (68 y.o. [name] Primary Care Provider: [name] Other Clinician: Referring Provider: Treating Provider/Extender:[name], Dema Severin, [name] in Treatment: 185 Encounter Discharge Information Items Discharge Condition: Stable Ambulatory Status: Walker Discharge Destination: Home Transportation: Private Auto Accompanied By: self Schedule Follow-up Appointment: Yes Clinical Summary of Care: Electronic Signature(s) Signed: 08/11/2019  6:07:42 PM By: Deon Pilling Entered By: Deon Pilling on 08/11/2019 12:35:09 -------------------------------------------------------------------------------- Lower Extremity Assessment Details Patient Name: Date of Service: [name], [name] 08/11/2019 10:30 AM Medical Record [id number] Patient Account Number: [id number] Date of Birth/Sex: Treating RN: 06-28-1951 (68 y.o. [name]) [name] Primary Care Provider: [name] Other Clinician: Referring Provider: Treating Provider/Extender:[name], Dema Severin, [name] in Treatment: 185 Edema Assessment Assessed: [Left: No] [Right: No] Edema: [Left: Yes] [Right: Yes] Calf Left: Right: Point of Measurement: 31 cm From Medial Instep 41 cm 48 cm Ankle Left: Right: Point of Measurement: 12 cm From Medial Instep 28 cm 28 cm Electronic Signature(s) Signed: 10/12/2019 2:59:38 PM By: [name] RN  Entered By: [name] on 08/11/2019 11:17:49 -------------------------------------------------------------------------------- Multi-Disciplinary Care Plan Details Patient Name: Date of Service: [name], [name] 08/11/2019 10:30 AM Medical Record [id number] Patient Account Number: [id number] Date of Birth/Sex: Treating RN: 01/09/1951 (68 y.o. [name] Primary Care [name]: [name] Other Clinician: Referring [name]: Treating [name]/Extender:[name], Dema Severin, [name] in Treatment: 185 Active Inactive Venous Leg Ulcer Nursing Diagnoses: Actual venous Insuffiency (use after diagnosis is confirmed) Goals: Patient will maintain optimal edema control Date Initiated: 09/10/2016 Target Resolution Date: 08/25/2019 Goal Status: Active Verify adequate tissue perfusion prior to therapeutic compression application Date Initiated: 09/10/2016 Date Inactivated: 11/28/2016 Goal Status: Met Interventions: Assess peripheral edema status every visit. Compression as ordered Provide education on venous  insufficiency Notes: edema not contolled above wraps, pt not using lymoh pumps regularly Wound/Skin Impairment Nursing Diagnoses: Impaired tissue integrity Goals: Patient/caregiver will verbalize understanding of skin care regimen Date Initiated: 09/10/2016 Target Resolution Date: 08/25/2019 Goal Status: Active Interventions: Assess patient/caregiver ability to perform ulcer/skin care regimen upon admission and as needed Assess ulceration(s) every visit Provide education on ulcer and skin care Notes: Electronic Signature(s) Signed: 08/12/2019 12:09:12 PM By: Baruch Gouty RN, BSN Entered By: Baruch Gouty on 08/11/2019 11:55:03 -------------------------------------------------------------------------------- Pain Assessment Details Patient Name: Date of Service: [name] 08/11/2019 10:30 AM Medical Record [id number] Patient Account Number: [id number] Date of Birth/Sex: Treating RN: Sep 05, 1951 (68 y.o. [name]) [name] Primary Care Peggie Hornak: [name] Other Clinician: Referring Shabria Egley: Treating Richard Holz/Extender:[name], Dema Severin, [name] in Treatment: 185 Active Problems Location of Pain Severity and Description of Pain Patient Has Paino No Site Locations Pain Management and Medication Current Pain Management: Electronic Signature(s) Signed: 10/12/2019 2:59:38 PM By: [name] RN Entered By: [name] on 08/11/2019 11:05:43 -------------------------------------------------------------------------------- Patient/Caregiver Education Details Patient Name: Date of Service: [name] 10/7/2020andnbsp10:30 AM Medical Record [id number] Patient Account Number: [id number] Date of Birth/Gender: Treating RN: 1951/11/03 (68 y.o. [name] Primary Care Physician: [name] Other Clinician: Referring Physician: Treating Physician/Extender:[name], Dema Severin, [name] in Treatment: 185 Education  Assessment Education Provided To: Patient Education Topics Provided Venous: Methods: Explain/Verbal Responses: Reinforcements needed, State content correctly Wound/Skin Impairment: Methods: Explain/Verbal Responses: Reinforcements needed, State content correctly Electronic Signature(s) Signed: 08/12/2019 12:09:12 PM By: Baruch Gouty RN, BSN Entered By: Baruch Gouty on 08/11/2019 11:55:42 -------------------------------------------------------------------------------- Wound Assessment Details Patient Name: Date of Service: [name] 08/11/2019 10:30 AM Medical Record [id number] Patient Account Number: [id number] Date of Birth/Sex: Treating RN: Mar 11, 1951 (68 y.o. [name]) [name] Primary Care Adriel Kessen: [name] Other Clinician: Referring Tayra Dawe: Treating Heaven Meeker/Extender:[name], Dema Severin, [name] in Treatment: 185 Wound Status Wound Number: [id number] Primary Lymphedema Etiology: Wound Location: Left Lower Leg - Lateral Wound Healed - Epithelialized Wounding Event: Blister Status: Date Acquired: 07/07/2019 Comorbid Chronic sinus problems/congestion, Weeks Of Treatment: 5 History: Arrhythmia, Hypertension, Peripheral Arterial Clustered Wound: Yes Disease, Type II Diabetes, History of Burn, Gout, Confinement Anxiety Photos Wound Measurements Length: (cm) 0 % Reduc Width: (cm) 0 % Reduc Depth: (cm) 0 Epithel Area: (cm) 0 Tunnel Volume: (cm) 0 Underm Wound Description Classification: Full Thickness Without Exposed Support Structures Wound Distinct, outline attached Margin: Exudate None Present Amount: Wound Bed Granulation Amount: None Present (0%) Necrotic Amount: None Present (0%) Foul Odor After Cleansing: No Slough/Fibrino No Exposed Structure Fascia Exposed: No Fat Layer (Subcutaneous Tissue) Exposed: No Tendon Exposed: No Muscle Exposed: No Joint Exposed: No Bone Exposed: No tion in Area: 100% tion in Volume:  100% ialization: Large (67-100%) ing:  No ining: No Electronic Signature(s) Signed: 08/12/2019 1:38:24 PM By: Mikeal Hawthorne EMT/HBOT Signed: 10/12/2019 2:59:38 PM By: [name] RN Entered By: Mikeal Hawthorne on 08/12/2019 11:54:57 -------------------------------------------------------------------------------- Wound Assessment Details Patient Name: Date of Service: [name] 08/11/2019 10:30 AM Medical Record [id number] Patient Account Number: [id number] Date of Birth/Sex: Treating RN: 1951/04/30 (68 y.o. [name]) [name] Primary Care Provider: [name] Other Clinician: Referring Provider: Treating Provider/Extender:[name], Dema Severin, [name] in Treatment: 185 Wound Status Wound Number: [id number] Primary Lymphedema Etiology: Wound Location: Left Upper Leg - Medial Wound Open Wounding Event: Blister Status: Date Acquired: 07/14/2019 Comorbid Chronic sinus problems/congestion, Weeks Of Treatment: 4 History: Arrhythmia, Hypertension, Peripheral Arterial Clustered Wound: No Disease, Type II Diabetes, History of Burn, Gout, Confinement Anxiety Photos Wound Measurements Length: (cm) 0.5 % Reduc Width: (cm) 0.4 % Reduc Depth: (cm) 0.1 Epithel Area: (cm) 0.157 Tunnel Volume: (cm) 0.016 Underm Wound Description Full Thickness Without Exposed Support Foul O Classification: Structures Slough Wound Distinct, outline attached Margin: Exudate Medium Medium Amount: Exudate Serosanguineous Type: Exudate red, brown Color: Wound Bed Granulation Amount: Large (67-100%) Granulation Quality: Red Fascia Expo Necrotic Amount: None Present (0%) Fat Layer ( Tendon Expo Muscle Expo Joint Expos Bone Expose dor After Cleansing: No /Fibrino No Exposed Structure sed: No Subcutaneous Tissue) Exposed: No sed: No sed: No ed: No d: No tion in Area: 95.6% tion in Volume: 95.5% ialization: Large (67-100%) ing: No ining: No Electronic  Signature(s) Signed: 08/12/2019 1:38:24 PM By: Mikeal Hawthorne EMT/HBOT Signed: 10/12/2019 2:59:38 PM By: [name] RN Entered By: Mikeal Hawthorne on 08/12/2019 11:53:51 -------------------------------------------------------------------------------- Wound Assessment Details Patient Name: Date of Service: [name] 08/11/2019 10:30 AM Medical Record [id number] Patient Account Number: [id number] Date of Birth/Sex: Treating RN: [birth year] (68 y.o. [name]) [name] Primary Care Provider: [name] Other Clinician: Referring Provider: Treating Provider/Extender:[name], Dema Severin, [name] in Treatment: 185 Wound Status Wound Number: [id number] Primary Lymphedema Etiology: Wound Location: Right Lower Leg - Anterior Wound Healed - Epithelialized Wounding Event: Gradually Appeared Status: Date Acquired: 07/26/2019 Comorbid Chronic sinus problems/congestion, Weeks Of Treatment: 1 History: Arrhythmia, Hypertension, Peripheral Arterial Clustered Wound: No Disease, Type II Diabetes, History of Burn, Gout, Confinement Anxiety Photos Wound Measurements Length: (cm) 0 % Reduction Width: (cm) 0 % Reduc Depth: (cm) 0 Epithel Area: (cm) 0 Tunnel Volume: (cm) 0 Underm Wound Description Classification: Full Thickness Without Exposed Support Structures Exudate None Present Amount: Wound Bed Granulation Amount: None Present (0%) Necrotic Amount: None Present (0%) Foul Odor After Cleansing: No Slough/Fibrino No Exposed Structure Fascia Exposed: No Fat Layer (Subcutaneous Tissue) Exposed: No Tendon Exposed: No Muscle Exposed: No Joint Exposed: No Bone Exposed: No in Area: 100% tion in Volume: 100% ialization: None ing: No ining: No Electronic Signature(s) Signed: 08/12/2019 1:38:24 PM By: Mikeal Hawthorne EMT/HBOT Signed: 10/12/2019 2:59:38 PM By: [name] RN Entered By: Mikeal Hawthorne on 08/12/2019  11:52:22 -------------------------------------------------------------------------------- Wound Assessment Details Patient Name: Date of Service: [name] 08/11/2019 10:30 AM Medical Record [id number] Patient Account Number: [id number] Date of Birth/Sex: Treating RN: [birth year] (68 y.o. [name]) [name] Primary Care Provider: [name] Other Clinician: Referring Provider: Treating Provider/Extender:[name], Dema Severin, [name] in Treatment: 185 Wound Status Wound Number: 137 Primary Lymphedema Etiology: Wound Location: Right Lower Leg - Medial, Proximal Wound Open Status: Wounding Event: Blister Comorbid Chronic sinus problems/congestion, Date Acquired: 08/01/2019 History: Arrhythmia, Hypertension, Peripheral Arterial Weeks Of Treatment: 1 Disease, Type II Diabetes, History of Burn, Clustered Wound: No Gout, Confinement Anxiety  Photos Wound Measurements Length: (cm) 5 % Reduc Width: (cm) 8 % Reduc Depth: (cm) 0.1 Epithel Area: (cm) 31.416 Tunnel Volume: (cm) 3.142 Underm Wound Description Classification: Full Thickness Without Exposed Support Foul O Structures Slough Exudate Medium Amount: Exudate Serosanguineous Type: Exudate red, brown Color: Wound Bed Granulation Amount: Small (1-33%) Granulation Quality: Pink Fascia Necrotic Amount: Large (67-100%) Fat Lay Necrotic Quality: Adherent Slough Tendon Muscle Joint E Bone Ex dor After Cleansing: No /Fibrino Yes Exposed Structure Exposed: No er (Subcutaneous Tissue) Exposed: Yes Exposed: No Exposed: No xposed: No posed: No tion in Area: 72% tion in Volume: 72% ialization: None ing: No ining: No Electronic Signature(s) Signed: 08/12/2019 1:38:24 PM By: Mikeal Hawthorne EMT/HBOT Signed: 10/12/2019 2:59:38 PM By: [name] RN Entered By: Mikeal Hawthorne on 08/12/2019 11:53:17 -------------------------------------------------------------------------------- Wound Assessment  Details Patient Name: Date of Service: [name] 08/11/2019 10:30 AM Medical Record [id number] Patient Account Number: [id number] Date of Birth/Sex: Treating RN: 1951-03-27 (68 y.o. [name]) [name] Primary Care Provider: [name] Other Clinician: Referring Provider: Treating Provider/Extender:[name], Dema Severin, [name] in Treatment: 185 Wound Status Wound Number: [id number] Primary Diabetic Wound/Ulcer of the Lower Extremity Etiology: Etiology: Wound Location: Right Toe Second - Anterior Wound Open Wounding Event: Gradually Appeared Status: Date Acquired: 08/08/2019 Comorbid Chronic sinus problems/congestion, Weeks Of Treatment: 0 History: Arrhythmia, Hypertension, Peripheral Arterial Clustered Wound: No Disease, Type II Diabetes, History of Burn, Gout, Confinement Anxiety Photos Wound Measurements Length: (cm) 1.1 Width: (cm) 1 Depth: (cm) 0.1 Area: (cm) 0.864 Volume: (cm) 0.086 Wound Description Classification: Grade 2 Exudate Amount: Small Exudate Type: Serosanguineous Exudate Color: red, brown Wound Bed Granulation Amount: Medium (34-66%) Granulation Quality: Pink Necrotic Amount: Medium (34-66%) Necrotic Quality: Adherent Slough After Cleansing: No rino Yes Exposed Structure sed: No Subcutaneous Tissue) Exposed: Yes sed: No sed: No ed: No d: No % Reduction in Area: 0% % Reduction in Volume: 0% Epithelialization: None Tunneling: No Undermining: No Foul Odor Slough/Fib Fascia Expo Fat Layer ( Tendon Expo Muscle Expo Joint Expos Bone Expose Electronic Signature(s) Signed: 08/12/2019 1:38:24 PM By: Mikeal Hawthorne EMT/HBOT Signed: 10/12/2019 2:59:38 PM By: [name] RN Entered By: Mikeal Hawthorne on 08/12/2019 11:55:34 -------------------------------------------------------------------------------- Vitals Details Patient Name: Date of Service: [name]. 08/11/2019 10:30 AM Medical Record [id number] Patient  Account Number: [id number] Date of Birth/Sex: Treating RN: [birth year] (68 y.o. [name]) Dolores Lory, [place] Primary Care Provider: [name] Other Clinician: Referring Provider: Treating Provider/Extender:[name], Dema Severin, [name] in Treatment: 185 Vital Signs Time Taken: 11:04 Temperature (F): 98.1 Height (in): 70 Pulse (bpm): 60 Weight (lbs): 380.2 Respiratory Rate (breaths/min): 18 Body Mass Index (BMI): 54.5 Blood Pressure (mmHg): 145/51 Reference Range: 80 - 120 mg / dl Electronic Signature(s) Signed: 10/12/2019 2:59:38 PM By: [name] RN Entered By: [name] on 08/11/2019 11:05:30

## 2019-10-12 NOTE — Progress Notes (Signed)
[name], [name] ([id number]) Visit Report for 09/21/2019 Arrival Information Details Patient Name: Date of Service: [name], [name] 09/21/2019 9:30 AM Medical Record [id number] Patient Account Number: [id number] Date of Birth/Sex: Treating RN: 05-Apr-1951 (68 y.o. [name] Primary Care Tyshell Ramberg: [name] Other Clinician: Referring Dartanyan Deasis: Treating Daryl Quiros/Extender:Robson, Peyton Najjar, [name] in Treatment: 78 Visit Information History Since Last Visit Walker Added or deleted any medications: No Patient Arrived: Any new allergies or adverse reactions: No Arrival Time: 09:53 Had a fall or experienced change in No Accompanied By: alone activities of daily living that may affect Transfer Assistance: None risk of falls: Patient Identification Verified: Yes Signs or symptoms of abuse/neglect since last No Secondary Verification Process Completed: Yes visito Patient Requires Transmission-Based No Hospitalized since last visit: No Precautions: Implantable device outside of the clinic excluding No Patient Has Alerts: Yes cellular tissue based products placed in the center since last visit: Has Dressing in Place as Prescribed: Yes Has Compression in Place as Prescribed: Yes Pain Present Now: No Electronic Signature(s) Signed: 09/23/2019 6:05:31 PM By: Kela Millin Entered By: Kela Millin on 09/21/2019 09:53:29 -------------------------------------------------------------------------------- Compression Therapy Details Patient Name: Date of Service: [name] 09/21/2019 9:30 AM Medical Record [id number] Patient Account Number: [id number] Date of Birth/Sex: Treating RN: 08/11/1951 (68 y.o. [name]) [name] Primary Care Lillee Mooneyhan: [name] Other Clinician: Referring Kaseem Vastine: Treating Mamoudou Mulvehill/Extender:Robson, Peyton Najjar, [name] in Treatment: 191 Compression Therapy Performed for Wound Wound #140  Left,Posterior Lower Leg Assessment: Performed By: Jake Church, RN Compression Type: Four Layer Post Procedure Diagnosis Same as Pre-procedure Electronic Signature(s) Signed: 10/12/2019 2:50:47 PM By: [name] RN Entered By: [name] on 09/21/2019 10:45:03 -------------------------------------------------------------------------------- Compression Therapy Details Patient Name: Date of Service: [name], [name] 09/21/2019 9:30 AM Medical Record [id number] Patient Account Number: [id number] Date of Birth/Sex: Treating RN: [birth year] (68 y.o. [name]) [name] Primary Care Marji Kuehnel: [name] Other Clinician: Referring Marlee Trentman: Treating Delbra Zellars/Extender:Robson, Peyton Najjar, [name] in Treatment: 191 Compression Therapy Performed for Wound Wound #142 Left,Anterior Lower Leg Assessment: Performed By: Jake Church, RN Compression Type: Four Layer Post Procedure Diagnosis Same as Pre-procedure Electronic Signature(s) Signed: 10/12/2019 2:50:47 PM By: [name] RN Entered By: [name] on 09/21/2019 10:45:03 -------------------------------------------------------------------------------- Compression Therapy Details Patient Name: Date of Service: [name], [name] 09/21/2019 9:30 AM Medical Record [id number] Patient Account Number: [id number] Date of Birth/Sex: Treating RN: Oct 08, 1951 (68 y.o. [name]) [name] Primary Care Stephania Macfarlane: [name] Other Clinician: Referring Amani Nodarse: Treating Meshia Rau/Extender:Robson, Peyton Najjar, [name] in Treatment: 191 Compression Therapy Performed for Wound Wound #145 Right,Medial Upper Leg Assessment: Performed By: Jake Church, RN Compression Type: Four Layer Post Procedure Diagnosis Same as Pre-procedure Electronic Signature(s) Signed: 10/12/2019 2:50:47 PM By: [name] RN Entered By: [name] on 09/21/2019  10:45:03 -------------------------------------------------------------------------------- Compression Therapy Details Patient Name: Date of Service: [name], [name] 09/21/2019 9:30 AM Medical Record [id number] Patient Account Number: [id number] Date of Birth/Sex: Treating RN: 06/04/1951 (68 y.o. [name]) [name] Primary Care Garnet Chatmon: [name] Other Clinician: Referring Janne Faulk: Treating Jya Hughston/Extender:Robson, Peyton Najjar, [name] in Treatment: 191 Compression Therapy Performed for Wound Wound #146 Left Toe Second Assessment: Performed By: Jake Church, RN Compression Type: Four Layer Post Procedure Diagnosis Same as Pre-procedure Electronic Signature(s) Signed: 10/12/2019 2:50:47 PM By: [name] RN Entered By: [name] on 09/21/2019 10:45:03 -------------------------------------------------------------------------------- Compression Therapy Details Patient Name: Date of Service: [name], [name] 09/21/2019 9:30 AM Medical Record [id number] Patient Account Number: [id number] Date of Birth/Sex: Treating  RN: [birth year] (68 y.o. [name]) [name] Primary Care Henryetta Corriveau: [name] Other Clinician: Referring Emmalou Hunger: Treating Alis Sawchuk/Extender:Robson, Peyton Najjar, [name] in Treatment: 191 Compression Therapy Performed for Wound NonWound Condition Lymphedema - Right Leg Assessment: Performed By: Clinician [name], RN Compression Type: [name] Post Procedure Diagnosis Same as Pre-procedure Electronic Signature(s) Signed: 10/12/2019 2:50:47 PM By: [name] RN Entered By: [name] on 09/21/2019 10:48:03 -------------------------------------------------------------------------------- Encounter Discharge Information Details Patient Name: Date of Service: [name], [name] 09/21/2019 9:30 AM Medical Record [id number] Patient Account Number: [id number] Date of Birth/Sex: Treating RN: 04/10/1951 (68 y.o.  [name] Primary Care Suzana Sohail: [name] Other Clinician: Referring Dicky Boer: Treating Wilhelmine Krogstad/Extender:Robson, Peyton Najjar, [name] in Treatment: 191 Encounter Discharge Information Items Discharge Condition: Stable Ambulatory Status: Walker Discharge Destination: Home Transportation: Private Auto Accompanied By: alone Schedule Follow-up Appointment: Yes Clinical Summary of Care: Patient Declined Electronic Signature(s) Signed: 09/27/2019 2:18:43 PM By: Levan Hurst RN, BSN Entered By: Levan Hurst on 09/21/2019 17:21:38 -------------------------------------------------------------------------------- Lower Extremity Assessment Details Patient Name: Date of Service: [name] 09/21/2019 9:30 AM Medical Record [id number] Patient Account Number: [id number] Date of Birth/Sex: Treating RN: 02/07/1951 (68 y.o. [name] Primary Care Ophelia Sipe: [name] Other Clinician: Referring Kyliah Deanda: Treating Ezekiel Menzer/Extender:Robson, Peyton Najjar, [name] in Treatment: 191 Edema Assessment Assessed: [Left: No] [Right: No] Edema: [Left: Yes] [Right: Yes] Calf Left: Right: Point of Measurement: 31 cm From Medial Instep 38.1 cm 40.3 cm Ankle Left: Right: Point of Measurement: 12 cm From Medial Instep 25.6 cm 25.5 cm Vascular Assessment Pulses: Dorsalis Pedis Palpable: [Left:Yes] [Right:Yes] Electronic Signature(s) Signed: 09/23/2019 6:05:31 PM By: Kela Millin Entered By: Kela Millin on 09/21/2019 10:04:04 -------------------------------------------------------------------------------- Multi Wound Chart Details Patient Name: Date of Service: [name] 09/21/2019 9:30 AM Medical Record [id number] Patient Account Number: [id number] Date of Birth/Sex: Treating RN: 1951-03-31 (68 y.o. [name]) [name] Primary Care Jiyan Walkowski: [name] Other Clinician: Referring Wyat Infinger: Treating  Savvy Peeters/Extender:Robson, Peyton Najjar, [name] in Treatment: 191 Vital Signs Height(in): 70 Capillary Blood 176 Glucose(mg/dl): Weight(lbs): 380.2 Pulse(bpm): 40 Body Mass Index(BMI): 14 Blood Pressure(mmHg): 167/71 Temperature(F): 97.7 Respiratory 18 Rate(breaths/min): Photos: [140:No Photos] [142:No Photos] [145:No Photos] Wound Location: [140:Left Lower Leg - Posterior Left Lower Leg - Anterior] [145:Right Upper Leg - Medial] Wounding Event: [140:Blister] [142:Blister] [145:Gradually Appeared] Primary Etiology: [140:Lymphedema] [142:Lymphedema] [145:Lymphedema] Secondary Etiology: [140:N/A] [142:N/A] [145:N/A] Comorbid History: [140:Chronic sinus problems/congestion, Arrhythmia, Hypertension, Arrhythmia, Hypertension, Arrhythmia, Hypertension, Peripheral Arterial Disease, Peripheral Arterial Disease, Peripheral Arterial Disease, Type II Diabetes, History of  Type II Diabetes, History of Type II Diabetes, History of Burn, Gout, Confinement Burn, Gout, Confinement Burn, Gout, Confinement Anxiety] [142:Chronic sinus problems/congestion, Anxiety] [145:Chronic sinus problems/congestion, Anxiety] Date Acquired: [140:08/27/2019] [142:09/07/2019] [145:09/12/2019] Weeks of Treatment: [140:3] [142:2] [145:1] Wound Status: [140:Open] [142:Open] [145:Open] Measurements L x W x D 0.3x0.3x0.1 [142:1x1x0.1] [145:1.5x2x0.1] (cm) Area (cm) : [140:0.071] [142:0.785] [145:2.356] Volume (cm) : [140:0.007] [142:0.079] [145:0.236] % Reduction in Area: [140:100.00%] [142:65.40%] [145:-100.00%] % Reduction in Volume: 100.00% [142:65.20%] [145:-100.00%] Classification: [140:Full Thickness Without Exposed Support Structures Exposed Support Structures] [142:Full Thickness Without] [145:Partial Thickness] Exudate Amount: [140:Medium] [142:Medium] [145:Medium] Exudate Type: [140:Serous] [142:Serous] [145:Serous] Exudate Color: [140:amber] [142:amber] [145:amber] Wound Margin: [140:Flat and Intact]  [142:Flat and Intact] [145:Flat and Intact] Granulation Amount: [140:Large (67-100%)] [142:Large (67-100%)] [145:Large (67-100%)] Granulation Quality: [140:Red, Pink] [142:Pink] [145:Pink] Necrotic Amount: [140:None Present (0%)] [142:None Present (0%)] [145:Small (1-33%)] Exposed Structures: [140:Fat Layer (Subcutaneous Tissue) Exposed: Yes Fascia: No Tendon: No Muscle: No Joint: No Bone: No] [142:Fat Layer (Subcutaneous Tissue) Exposed:  Yes Fascia: No Tendon: No Muscle: No Joint: No Bone: No] [145:Fat Layer (Subcutaneous Tissue) Exposed:  Yes Fascia: No Tendon: No Muscle: No Joint: No Bone: No] Epithelialization: [140:Medium (34-66%)] [142:Large (67-100%)] [145:Small (1-33%)] Procedures Performed: [140:Compression Therapy] [142:Compression Therapy 146 N/A] [145:Compression Therapy N/A] Photos: [140:No Photos] [142:N/A] [145:N/A] Wound Location: [140:Left Toe Second] [142:N/A] [145:N/A] Wounding Event: [140:Gradually Appeared] [142:N/A] [145:N/A] Primary Etiology: [140:Lymphedema] [142:N/A] [145:N/A] 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] [145:N/A] Comorbid History: [140:Chronic sinus problems/congestion, Arrhythmia, Hypertension, Peripheral Arterial Disease, Type II Diabetes, History of Burn, Gout, Confinement Anxiety] [142:N/A] [145:N/A] Date Acquired: [140:09/14/2019] [142:N/A] [145:N/A] Weeks of Treatment: [140:1] [142:N/A] [145:N/A] Wound Status: [140:Open] [142:N/A] [145:N/A] Measurements L x W x D 0.2x0.2x0.1 [142:N/A] [145:N/A] (cm) Area (cm) : [140:0.031] [142:N/A] [145:N/A] Volume (cm) : [140:0.003] [142:N/A] [145:N/A] % Reduction in Area: [140:85.90%] [142:N/A] [145:N/A] % Reduction in Volume: 86.40% [142:N/A] [145:N/A] Classification: [140:Full Thickness Without Exposed Support Structures] [142:N/A] [145:N/A] Exudate Amount: [140:Medium] [142:N/A] [145:N/A] Exudate Type: [140:Serous] [142:N/A] [145:N/A] Exudate Color: [140:amber] [142:N/A]  [145:N/A] Wound Margin: [140:Flat and Intact] [142:N/A] [145:N/A] Granulation Amount: [140:Large (67-100%)] [142:N/A] [145:N/A] Granulation Quality: [140:Red] [142:N/A] [145:N/A] Necrotic Amount: [140:None Present (0%)] [142:N/A] [145:N/A] Exposed Structures: [140:Fat Layer (Subcutaneous N/A Tissue) Exposed: Yes Fascia: No Tendon: No Muscle: No Joint: No Bone: No] [145:N/A] Epithelialization: [140:Medium (34-66%)] [142:N/A N/A] [145:N/A N/A] Treatment Notes Electronic Signature(s) Signed: 09/21/2019 6:07:35 PM By: Linton Ham MD Signed: 10/12/2019 2:50:47 PM By: [name] RN Entered By: Linton Ham on 09/21/2019 10:45:46 -------------------------------------------------------------------------------- Multi-Disciplinary Care Plan Details Patient Name: Date of Service: [name]. 09/21/2019 9:30 AM Medical Record [id number] Patient Account Number: [id number] Date of Birth/Sex: Treating RN: 03-Apr-1951 (68 y.o. [name]) Dolores Lory, [place] Primary Care Caitland Porchia: [name] Other Clinician: Referring Mahalie Kanner: Treating Marquice Uddin/Extender:Robson, Peyton Najjar, [name] in Treatment: 191 Active Inactive Venous Leg Ulcer Nursing Diagnoses: Actual venous Insuffiency (use after diagnosis is confirmed) Goals: Patient will maintain optimal edema control Date Initiated: 09/10/2016 Target Resolution Date: 10/01/2019 Goal Status: Active Verify adequate tissue perfusion prior to therapeutic compression application Date Initiated: 09/10/2016 Date Inactivated: 11/28/2016 Goal Status: Met Interventions: Assess peripheral edema status every visit. Compression as ordered Provide education on venous insufficiency Notes: edema not contolled above wraps, pt not using lymoh pumps regularly Wound/Skin Impairment Nursing Diagnoses: Impaired tissue integrity Goals: Patient/caregiver will verbalize understanding of skin care regimen Date Initiated: 09/10/2016 Target Resolution  Date: 10/01/2019 Goal Status: Active Interventions: Assess patient/caregiver ability to perform ulcer/skin care regimen upon admission and as needed Assess ulceration(s) every visit Provide education on ulcer and skin care Notes: Electronic Signature(s) Signed: 10/12/2019 2:50:47 PM By: [name] RN Entered By: [name] on 09/21/2019 09:39:34 -------------------------------------------------------------------------------- Pain Assessment Details Patient Name: Date of Service: [name], [name] 09/21/2019 9:30 AM Medical Record [id number] Patient Account Number: [id number] Date of Birth/Sex: Treating RN: 13-Jan-1951 (68 y.o. [name] Primary Care Mirabel Ahlgren: [name] Other Clinician: Referring Rafferty Postlewait: Treating Maley Venezia/Extender:Robson, Peyton Najjar, [name] in Treatment: 191 Active Problems Location of Pain Severity and Description of Pain Patient Has Paino No Site Locations Pain Management and Medication Current Pain Management: Electronic Signature(s) Signed: 09/23/2019 6:05:31 PM By: Kela Millin Entered By: Kela Millin on 09/21/2019 10:03:00 -------------------------------------------------------------------------------- Patient/Caregiver Education Details Patient Name: Date of Service: [name] 11/17/2020andnbsp9:30 AM Medical Record [id number] Patient Account Number: [id number] Date of Birth/Gender: [birth year] (68 y.o. M) Treating RN: [name] Primary Care Physician: [name] Other Clinician: Referring Physician: Treating Physician/Extender:Robson, Peyton Najjar, [name] in Treatment: [phone] Education Assessment Education Provided To:  Patient Education Topics Provided Venous: Methods: Explain/Verbal Responses: State content correctly Wound/Skin Impairment: Methods: Explain/Verbal Responses: State content correctly Electronic Signature(s) Signed: 10/12/2019 2:50:47 PM By: [name]  RN Entered By: [name] on 09/21/2019 09:58:09 -------------------------------------------------------------------------------- Wound Assessment Details Patient Name: Date of Service: [name], [name] 09/21/2019 9:30 AM Medical Record [id number] Patient Account Number: [id number] Date of Birth/Sex: Treating RN: 1951-02-24 (68 y.o. [name] Primary Care Aeryn Medici: [name] Other Clinician: Referring Less Woolsey: Treating Kenyah Luba/Extender:Robson, Peyton Najjar, [name] in Treatment: 191 Wound Status Wound Number: 140 Primary Lymphedema Etiology: Wound Location: Left Lower Leg - Posterior Wound Open Wounding Event: Blister Status: Date Acquired: 08/27/2019 Comorbid Chronic sinus problems/congestion, Weeks Of Treatment: 3 History: Arrhythmia, Hypertension, Peripheral Arterial Clustered Wound: No Disease, Type II Diabetes, History of Burn, Gout, Confinement Anxiety Photos Wound Measurements Length: (cm) 0.3 % Reduc Width: (cm) 0.3 % Reduc Depth: (cm) 0.1 Epithel Area: (cm) 0.071 Tunnel Volume: (cm) 0.007 Underm Wound Description Classification: Full Thickness Without Exposed Support Foul Od Structures Slough/ Wound Flat and Intact Margin: Exudate Medium Amount: Exudate Serous Type: Exudate amber Color: Wound Bed Granulation Amount: Large (67-100%) Granulation Quality: Red, Pink Fascia Necrotic Amount: None Present (0%) Fat Lay Tendon Muscle Joint [name] Bone Ex or After Cleansing: No Fibrino No Exposed Structure Exposed: No er (Subcutaneous Tissue) Exposed: Yes Exposed: No Exposed: No xposed: No posed: No tion in Area: 100% tion in Volume: 100% ialization: Medium (34-66%) ing: No ining: No Electronic Signature(s) Signed: 09/22/2019 4:21:10 PM By: Mikeal Hawthorne EMT/HBOT Signed: 09/23/2019 6:05:31 PM By: Kela Millin Entered By: Mikeal Hawthorne on 09/22/2019  11:19:19 -------------------------------------------------------------------------------- Wound Assessment Details Patient Name: Date of Service: [name] 09/21/2019 9:30 AM Medical Record [id number] Patient Account Number: [id number] Date of Birth/Sex: Treating RN: 1951-08-27 (68 y.o. [name] Primary Care Nikko Quast: [name] Other Clinician: Referring Briyonna Omara: Treating Whyatt Klinger/Extender:Robson, Peyton Najjar, [name] in Treatment: 191 Wound Status Wound Number: [id number] Primary Lymphedema Etiology: Wound Location: Left Lower Leg - Anterior Wound Open Wounding Event: Blister Status: Date Acquired: 09/07/2019 Comorbid Chronic sinus problems/congestion, Weeks Of Treatment: 2 History: Arrhythmia, Hypertension, Peripheral Arterial Clustered Wound: No Disease, Type II Diabetes, History of Burn, Gout, Confinement Anxiety Photos Wound Measurements Length: (cm) 1 % Reduct Width: (cm) 1 % Reduct Depth: (cm) 0.1 Epitheli Area: (cm) 0.785 Tunneli Volume: (cm) 0.079 Undermi Wound Description Classification: Full Thickness Without Exposed Support Foul Odo Structures Slough/F Wound Flat and Intact Margin: Exudate Medium Amount: Exudate Serous Type: Exudate amber Color: Wound Bed Granulation Amount: Large (67-100%) Granulation Quality: Pink Fascia [name] Necrotic Amount: None Present (0%) Fat Laye Tendon [name] Muscle [name] Joint Ex Bone Exp r After Cleansing: No ibrino No Exposed Structure xposed: No r (Subcutaneous Tissue) Exposed: Yes xposed: No xposed: No posed: No osed: No ion in Area: 65.4% ion in Volume: 65.2% alization: Large (67-100%) ng: No ning: No Electronic Signature(s) Signed: 09/22/2019 4:21:10 PM By: Mikeal Hawthorne EMT/HBOT Signed: 09/23/2019 6:05:31 PM By: Kela Millin Entered By: Mikeal Hawthorne on 09/22/2019 11:17:22 -------------------------------------------------------------------------------- Wound Assessment  Details Patient Name: Date of Service: [name] 09/21/2019 9:30 AM Medical Record [name] Patient Account Number: [id number] Date of Birth/Sex: Treating RN: 1950/11/21 (68 y.o. [name] Primary Care Joelly Bolanos: [name] Other Clinician: Referring Kloi Brodman: Treating Loveah Like/Extender:Robson, Peyton Najjar, [name] in Treatment: 191 Wound Status Wound Number: 145 Primary Lymphedema Etiology: Wound Location: Right Upper Leg - Medial Wound Open Wounding Event: Gradually Appeared Status: Date Acquired: 09/12/2019 Comorbid Chronic sinus problems/congestion, Weeks Of Treatment:  1 History: Arrhythmia, Hypertension, Peripheral Arterial Clustered Wound: No Disease, Type II Diabetes, History of Burn, Gout, Confinement Anxiety Photos Wound Measurements Length: (cm) 1.5 % Reduction in Ar Width: (cm) 2 % Reduction in Vo Depth: (cm) 0.1 Epithelialization Area: (cm) 2.356 Tunneling: Volume: (cm) 0.236 Undermining: Wound Description Classification: Partial Thickness Foul Odor After C Wound Margin: Flat and Intact Slough/Fibrino Exudate Amount: Medium Exudate Type: Serous Exudate Color: amber Wound Bed Granulation Amount: Large (67-100%) Granulation Quality: Pink Fascia Exposed: Necrotic Amount: Small (1-33%) Fat Layer (Subcut Necrotic Quality: Adherent Slough Tendon Exposed: Muscle Exposed: Joint Exposed: Bone Exposed: leansing: No Yes Exposed Structure No aneous Tissue) Exposed: Yes No No No No ea: -100% lume: -100% : Small (1-33%) No No Electronic Signature(s) Signed: 09/22/2019 4:21:10 PM By: Mikeal Hawthorne EMT/HBOT Signed: 09/23/2019 6:05:31 PM By: Kela Millin Entered By: Mikeal Hawthorne on 09/22/2019 11:16:20 -------------------------------------------------------------------------------- Wound Assessment Details Patient Name: Date of Service: [name] 09/21/2019 9:30 AM Medical Record [name]  Patient Account Number: [id number] Date of Birth/Sex: Treating RN: 02-Mar-1951 (68 y.o. [name] Primary Care Hartwell Vandiver: [name] Other Clinician: Referring Jaedyn Lard: Treating Joyclyn Plazola/Extender:Robson, Peyton Najjar, [name] in Treatment: 191 Wound Status Wound Number: 146 Primary Lymphedema Etiology: Wound Location: Left Toe Second Secondary Diabetic Wound/Ulcer of the Lower Extremity Wounding Event: Gradually Appeared Etiology: Date Acquired: 09/14/2019 Wound Open Weeks Of Treatment: 1 Status: Clustered Wound: No Comorbid Chronic sinus problems/congestion, History: Arrhythmia, Hypertension, Peripheral Arterial Disease, Type II Diabetes, History of Burn, Gout, Confinement Anxiety Photos Wound Measurements Length: (cm) 0.2 % Reduc Width: (cm) 0.2 % Reduc Depth: (cm) 0.1 Epithel Area: (cm) 0.031 Tunnel Volume: (cm) 0.003 Underm Wound Description Classification: Full Thickness Without Exposed Support Foul Od Structures Slough/ Wound Flat and Intact Margin: Exudate Medium Amount: Exudate Serous Type: Exudate amber amber Color: Wound Bed Granulation Amount: Large (67-100%) Granulation Quality: Red Fascia [name] Necrotic Amount: None Present (0%) Fat Laye Tendon [name] Muscle [name] Joint Ex Bone Exp or After Cleansing: No Fibrino No Exposed Structure xposed: No r (Subcutaneous Tissue) Exposed: Yes xposed: No xposed: No posed: No osed: No tion in Area: 85.9% tion in Volume: 86.4% ialization: Medium (34-66%) ing: No ining: No Electronic Signature(s) Signed: 09/22/2019 4:21:10 PM By: Mikeal Hawthorne EMT/HBOT Signed: 09/23/2019 6:05:31 PM By: Kela Millin Entered By: Mikeal Hawthorne on 09/22/2019 11:18:04 -------------------------------------------------------------------------------- Vitals Details Patient Name: Date of Service: [name]. 09/21/2019 9:30 AM Medical Record [id number] Patient Account Number: [id number] Date of  Birth/Sex: Treating RN: [birth year] (69 y.o. [name] Primary Care Journei Thomassen: [name] Other Clinician: Referring Ester Mabe: Treating Lailany Enoch/Extender:Robson, Peyton Najjar, [name] in Treatment: 191 Vital Signs Time Taken: 09:55 Temperature (F): 97.7 Height (in): 70 Pulse (bpm): 57 Weight (lbs): 380.2 Respiratory Rate (breaths/min): 18 Body Mass Index (BMI): 54.5 Blood Pressure (mmHg): 167/71 Capillary Blood Glucose (mg/dl): 176 Reference Range: 80 - 120 mg / dl Notes patient reported blood sugar of 176 this morning Electronic Signature(s) Signed: 09/23/2019 6:05:31 PM By: Kela Millin Entered By: Kela Millin on 09/21/2019 09:57:14

## 2019-10-12 NOTE — Progress Notes (Signed)
[name], [name] ([id number]) Visit Report for 06/23/2019 Arrival Information Details Patient Name: Date of Service: [name], [name] 06/23/2019 12:45 PM Medical Record [id number] Patient Account Number: [id number] Date of Birth/Sex: Treating RN: 11-02-1951 (68 y.o. [name]) [name] Primary Care Linsey Arteaga: [name] Other Clinician: Sandre Kitty Referring Briselda Naval: Treating Sayra Frisby/Extender:[name], Dema Severin, [name] in Treatment: 75 Visit Information History Since Last Visit Walker All ordered tests and consults were completed: No Patient Arrived: Added or deleted any medications: No Arrival Time: 13:40 Any new allergies or adverse reactions: No Accompanied By: self Had a fall or experienced change in No Transfer Assistance: None activities of daily living that may affect Patient Identification Verified: Yes risk of falls: Secondary Verification Process Completed: Yes Signs or symptoms of abuse/neglect since last No Patient Requires Transmission-Based No visito Precautions: Hospitalized since last visit: No Patient Has Alerts: Yes Implantable device outside of the clinic excluding No cellular tissue based products placed in the center since last visit: Has Dressing in Place as Prescribed: Yes Has Compression in Place as Prescribed: Yes Pain Present Now: No Electronic Signature(s) Signed: 10/12/2019 3:07:45 PM By: [name] RN Entered By: [name] on 06/23/2019 13:42:19 -------------------------------------------------------------------------------- Compression Therapy Details Patient Name: Date of Service: [name], [name] 06/23/2019 12:45 PM Medical Record [id number] Patient Account Number: [id number] Date of Birth/Sex: Treating RN: [birth year] (68 y.o. [name] Primary Care Niamya Vittitow: [name] Other Clinician: Sandre Kitty Referring Shneur Whitten[place]: Treating Emaan Gary/Extender:[name], Dema Severin, [name] in  Treatment: 178 Compression Therapy Performed for Wound NonWound Condition Lymphedema - Right Leg Assessment: Performed By: Clinician Deon Pilling, RN Compression Type: [name] Post Procedure Diagnosis Same as Pre-procedure Electronic Signature(s) Signed: 06/23/2019 6:05:14 PM By: Baruch Gouty RN, BSN Entered By: Baruch Gouty on 06/23/2019 14:19:50 -------------------------------------------------------------------------------- Compression Therapy Details Patient Name: Date of Service: [name] 06/23/2019 12:45 PM Medical Record [id number] Patient Account Number: [id number] Date of Birth/Sex: Treating RN: Aug 03, 1951 (68 y.o. [name] Primary Care Dartanian Knaggs: [name] Other Clinician: Sandre Kitty Referring Saphyra Hutt: Treating Tareva Leske/Extender:[name], Dema Severin, [name] in Treatment: 178 Compression Therapy Performed for Wound Wound #128 Left,Medial Malleolus Assessment: Performed By: Clinician Deon Pilling, RN Compression Type: Four Layer Post Procedure Diagnosis Same as Pre-procedure Electronic Signature(s) Signed: 06/23/2019 6:05:14 PM By: Baruch Gouty RN, BSN Entered By: Baruch Gouty on 06/23/2019 14:20:24 -------------------------------------------------------------------------------- Encounter Discharge Information Details Patient Name: Date of Service: [name]. 06/23/2019 12:45 PM Medical Record [id number] Patient Account Number: [id number] Date of Birth/Sex: Treating RN: 27-Mar-1951 (68 y.o. [name] Primary Care Navdeep Fessenden: [name] Other Clinician: Sandre Kitty Referring Alani Sabbagh: Treating Tlaloc Taddei/Extender:[name], Dema Severin, [name] in Treatment: 178 Encounter Discharge Information Items Discharge Condition: Stable Ambulatory Status: Walker Discharge Destination: Home Transportation: Private Auto Accompanied By: self Schedule Follow-up Appointment: Yes Clinical  Summary of Care: Electronic Signature(s) Signed: 06/23/2019 6:03:20 PM By: Deon Pilling Entered By: Deon Pilling on 06/23/2019 17:10:29 -------------------------------------------------------------------------------- Lower Extremity Assessment Details Patient Name: Date of Service: [name], [name] 06/23/2019 12:45 PM Medical Record [id number] Patient Account Number: [id number] Date of Birth/Sex: Treating RN: 10-17-1951 (68 y.o. [name]) [name] Primary Care Jersie Beel: [name] Other Clinician: Sandre Kitty Referring Nassir Neidert: Treating Jadaya Sommerfield/Extender:[name], Dema Severin, [name] in Treatment: 178 Edema Assessment Assessed: [Left: No] [Right: No] Edema: [Left: Yes] [Right: Yes] Calf Left: Right: Point of Measurement: 31 cm From Medial Instep 41 cm 45 cm Ankle Left: Right: Point of Measurement: 12 cm From Medial Instep 25 cm 26 cm Electronic  Signature(s) Signed: 10/12/2019 3:07:45 PM By: [name] RN Entered By: [name] on 06/23/2019 13:46:14 -------------------------------------------------------------------------------- Multi-Disciplinary Care Plan Details Patient Name: Date of Service: [name]. 06/23/2019 12:45 PM Medical Record [id number] Patient Account Number: [id number] Date of Birth/Sex: Treating RN: [birth year] (68 y.o. [name] Primary Care Orva Gwaltney: [name] Other Clinician: Sandre Kitty Referring Corday Wyka: Treating Patrick Salemi/Extender:[name], Dema Severin, [name] in Treatment: 178 Active Inactive Venous Leg Ulcer Nursing Diagnoses: Actual venous Insuffiency (use after diagnosis is confirmed) Goals: Patient will maintain optimal edema control Date Initiated: 09/10/2016 Target Resolution Date: 06/30/2019 Goal Status: Active Verify adequate tissue perfusion prior to therapeutic compression application Date Initiated: 09/10/2016 Date Inactivated: 11/28/2016 Goal Status:  Met Interventions: Assess peripheral edema status every visit. Compression as ordered Provide education on venous insufficiency Notes: edema not contolled above wraps, pt not using lymoh pumps regularly Wound/Skin Impairment Nursing Diagnoses: Impaired tissue integrity Goals: Patient/caregiver will verbalize understanding of skin care regimen Date Initiated: 09/10/2016 Target Resolution Date: 06/30/2019 Goal Status: Active Interventions: Assess patient/caregiver ability to perform ulcer/skin care regimen upon admission and as needed Assess ulceration(s) every visit Provide education on ulcer and skin care Notes: Electronic Signature(s) Signed: 06/23/2019 6:05:14 PM By: Baruch Gouty RN, BSN Entered By: Baruch Gouty on 06/23/2019 14:14:26 -------------------------------------------------------------------------------- Pain Assessment Details Patient Name: Date of Service: [name] 06/23/2019 12:45 PM Medical Record [id number] Patient Account Number: [id number] Date of Birth/Sex: Treating RN: 07/02/1951 (68 y.o. [name]) [name] Primary Care Momodou Consiglio: [name] Other Clinician: Sandre Kitty Referring Tilla Wilborn: Treating Tonae Livolsi/Extender:[name], Dema Severin, [name] in Treatment: 178 Active Problems Location of Pain Severity and Description of Pain Patient Has Paino No Site Locations Pain Management and Medication Current Pain Management: Electronic Signature(s) Signed: 10/12/2019 3:07:45 PM By: [name] RN Entered By: [name] on 06/23/2019 13:43:39 -------------------------------------------------------------------------------- Patient/Caregiver Education Details Patient Name: Date of Service: [name] 8/19/2020andnbsp12:45 PM Medical Record [id number] Patient Account Number: [id number] Date of Birth/Gender: Treating RN: [birth year] (68 y.o. [name] Primary Care Physician: [name] Other Clinician:  Sandre Kitty Referring Physician: Treating Physician/Extender:[name], Dema Severin, [name] in Treatment: 72 Education Assessment Education Provided To: Patient Education Topics Provided Venous: Methods: Explain/Verbal Responses: Reinforcements needed, State content correctly Wound/Skin Impairment: Methods: Explain/Verbal Responses: Reinforcements needed, State content correctly Electronic Signature(s) Signed: 06/23/2019 6:05:14 PM By: Baruch Gouty RN, BSN Entered By: Baruch Gouty on 06/23/2019 14:18:13 -------------------------------------------------------------------------------- Wound Assessment Details Patient Name: Date of Service: [name]. 06/23/2019 12:45 PM Medical Record [id number] Patient Account Number: [id number] Date of Birth/Sex: Treating RN: 10/17/1951 (68 y.o. [name]) [name] Primary Care Deran Barro: [name] Other Clinician: Sandre Kitty Referring Jaquavion Mccannon: Treating Kelvin Sennett/Extender:[name], Dema Severin, [name] in Treatment: 178 Wound Status Wound Number: 128 Primary To be determined Etiology: Wound Location: Left Malleolus - Medial Wound Open Wounding Event: Blister Status: Date Acquired: 06/15/2019 Comorbid Chronic sinus problems/congestion, Weeks Of Treatment: 1 History: Arrhythmia, Hypertension, Peripheral Arterial Clustered Wound: No Disease, Type II Diabetes, History of Burn, Gout, Confinement Anxiety Photos Wound Measurements Length: (cm) 0 % Reduction Width: (cm) 0 % Reduction Depth: (cm) 0 Epitheliali Area: (cm) 0 Tunneling: Volume: (cm) 0 Underminin Wound Description Classification: Partial Thickness Foul Odor Wound Margin: Flat and Intact Slough/Fib Exudate Amount: Small Exudate Type: Serosanguineous Exudate Color: red, brown Wound Bed Granulation Amount: Medium (34-66%) Granulation Quality: Pink Fascia Exp Necrotic Amount: None Present (0%) Fat Layer Tendon Exp Muscle  Exp Joint Expos Bone Expose After Cleansing: No rino No Exposed Structure  osed: No (Subcutaneous Tissue) Exposed: Yes osed: No osed: No ed: No d: No in Area: 100% in Volume: 100% zation: None No g: No Electronic Signature(s) Signed: 06/24/2019 3:52:27 PM By: Mikeal Hawthorne EMT/HBOT Signed: 10/12/2019 3:07:45 PM By: [name] RN Entered By: Mikeal Hawthorne on 06/24/2019 11:29:35 -------------------------------------------------------------------------------- Vitals Details Patient Name: Date of Service: [name]. 06/23/2019 12:45 PM Medical Record [id number] Patient Account Number: [id number] Date of Birth/Sex: Treating RN: 1951/05/13 (68 y.o. [name]) Dolores Lory, [place] Primary Care Navia Lindahl: [name] Other Clinician: Sandre Kitty Referring Jayston Trevino: Treating Khyre [name]/Extender:[name], Dema Severin, [name] in Treatment: 178 Vital Signs Time Taken: 13:43 Temperature (F): 98.2 Height (in): 70 Pulse (bpm): 51 Weight (lbs): 380.2 Respiratory Rate (breaths/min): 20 Body Mass Index (BMI): 54.5 Blood Pressure (mmHg): 130/53 Reference Range: 80 - 120 mg / dl Electronic Signature(s) Signed: 10/12/2019 3:07:45 PM By: [name] RN Entered By: [name] on 06/23/2019 13:43:30

## 2019-10-12 NOTE — Progress Notes (Signed)
[name], [name] ([id number]) Visit Report for 09/21/2019 HPI Details Patient Name: Date of Service: [name], [name] 09/21/2019 9:30 AM Medical Record [id number] Patient Account Number: [id number] Date of Birth/Sex: Treating RN: November 21, 1950 (68 y.o. [name]) Yevonne Pax Primary Care Provider: Nicoletta [name] Other Clinician: Referring Provider: Treating Provider/Extender:Robson, Lottie Rater, [name] in Treatment: 191 History of Present Illness HPI Description: Referred by PCP for consultation. Patient has long standing history of BLE venous stasis, no prior ulcerations. At beginning of month, developed cellulitis and weeping. Received IM Rocephin followed by Keflex and resolved. Wears compression stocking, appr 6 months old. Not sure strength. No present drainage. 01/22/16 this is a patient who is a type II diabetic on insulin. 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 He tells me he religiously wears pressure stockings of uncertain strength. He was here with weeping edema about 8 months ago but did not have an open wound. Roughly a month ago he had a reopening on his bilateral legs. He is been using bandages and Neosporin. He does not complain of pain. 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 Last BUN/creatinine I have is from 11/20/15 at 13 and 1.0 respectively 01/29/16; patient arrives today having tolerated the Profore wrap. He brought in his stockings and these are 18 mmHg stockings he bought from [place]. The compression here is likely inadequate. He does not complain of pain or excessive drainage she has no systemic symptoms. The wound on the right looks improved as does the one on the left although one on the left is more substantial with still tissue at risk below the actual wound area on the bilateral posterior calf 02/05/16; patient  arrives with poor edema control. He states that we did put a 4 layer compression on it last week. No weight appear 5 this. 02/12/16; the area on the posterior right Has healed. The left Has a substantial wound that has necrotic surface eschar that requires a debridement with a curette. 02/16/16;the patient called or a Nurse visit secondary to increased swelling. He had been in earlier in the week with his right leg healed. He was transitioned to is on pressure stocking on the right leg with the only open wound on the left, a substantial area on the left posterior calf. Note he has a history of severe lower extremity edema, he has a history of chronic atrial fibrillation but not heart failure per my notes but I'll need to research this. He is not complaining of chest pain shortness of breath or orthopnea. The intake nurse noted blisters on the previously closed right leg 02/19/16; this is the patient's regular visit day. I see him on Friday with escalating edema new wounds on the right leg and clear signs of at least right ventricular heart failure. I increased his Lasix to 40 twice a day. He is returning currently in follow-up. States he is noticed a decrease in that the edema 02/26/16 patient's legs have much less edema. There is nothing really open on the right leg. The left leg has improved condition of the large superficial wound on the posterior left leg 03/04/16; edema control is very much better. The patient's right leg wounds have healed. On the left leg he continues to have severe venous inflammation on the posterior aspect of the left leg. There is no tenderness and I don't think any of this is cellulitis.  03/11/16; patient's right leg is married healed and he is in his own stocking. The patient's left leg has deteriorated somewhat. There is a lot of erythema around the wound on the posterior left leg. There is also a significant rim of erythema posteriorly just above where the wrap would've  ended there is a new wound in this location and a lot of tenderness. Can't rule out cellulitis in this area. 03/15/16; patient's right leg remains healed and he is in his own stocking. The patient's left leg is much better than last review. His major wound on the posterior aspect of his left Is almost fully epithelialized. He has 3 small injuries from the wraps. Really. Erythema seems a lot better on antibiotics 03/18/16; right leg remains healed and he is in his own stocking. The patient's left leg is much better. The area on the posterior aspect of the left calf is fully epithelialized. His 3 small injuries which were wrap injuries on the left are improved only one seems still open his erythema has resolved 03/25/16; patient's right leg remains healed and he is in his own stocking. There is no open area today on the left leg posterior leg is completely closed up. His wrap injuries at the superior aspect of his leg are also resolved. He looks as though he has some irritation on the dorsal ankle but this is fully epithelialized without evidence of infection. 03/28/16; we discharged this patient on Monday. Transitioned him into his own stocking. There were problems almost immediately with uncontrolled swelling weeping edema multiple some of which have opened. He does not feel systemically unwell in particular no chest pain no shortness of breath and he does not feel 04/08/16; the edema is under better control with the Profore light wrap but he still has pitting edema. There is one large wound anteriorly 2 on the medial aspect of his left leg and 3 small areas on the superior posterior calf. Drainage is not excessive he is tolerating a Profore light well 04/15/16; put a Profore wrap on him last week. This is controlled is edema however he had a lot of pain on his left anterior foot most of his wounds are healed 04/22/16 once again the patient has denuded areas on the left anterior foot which he states are  because his wrap slips up word. He saw his primary physician today is on Lasix 40 twice a day and states that he his weight is down 20 pounds over the last 3 months. 04/29/16: Much improved. left anterior foot much improved. He is now on Lasix 80 mg per day. Much improved edema control 05/06/16; I was hoping to be able to discharge him today however once again he has blisters at a low level of where the compression was placed last week mostly on his left lateral but also his left medial leg and a small area on the anterior part of the left foot. 05/09/16; apparently the patient went home after his appointment on 7/4 later in the evening developing pain in his upper medial thigh together with subjective fever and chills although his temperature was not taken. The pain was so intense he felt he would probably have to call 911. However he then remembered that he had leftover doxycycline from a previous round of antibiotics and took these. By the next morning he felt a lot better. He called and spoke to one of our nurses and I approved doxycycline over the phone thinking that this was in relation to the  wounds we had previously seen although they were definitely were not. The patient feels a lot better old fever no chills he is still working. Blood sugars are reasonably controlled 05/13/16; patient is back in for review of his cellulitis on his anterior medial upper thigh. He is taking doxycycline this is a lot better. Culture I did of the nodular area on the dorsal aspect of his foot grew MRSA this also looks a lot better. 05/20/16; the patient is cellulitis on the medial upper thigh has resolved. All of his wound areas including the left anterior foot, areas on the medial aspect of the left calf and the lateral aspect of the calf at all resolved. He has a new blister on the left dorsal foot at the level of the fourth toe this was excised. No evidence of infection 05/27/16; patient continues to complain  weeping edema. He has new blisterlike wounds on the left anterior lateral and posterior lateral calf at the top of his wrap levels. The area on his left anterior foot appears better. He is not complaining of fever, pain or pruritus in his feet. 05/30/16; the patient's blisters on his left anterior leg posterior calf all look improved. He did not increase the Lasix 100 mg as I suggested because he was going to run out of his 40 mg tablets. He is still having weeping edema of his toes 06/03/16; I renewed his Lasix at 80 mg once a day as he was about to run out when I last saw him. He is on 80 mg of Lasix now. I have asked him to cut down on the excessive amount of water he was drinking and asked him to drink according to his thirst mechanisms 06/12/2016 -- was seen 2 days ago and was supposed to wear his compression stockings at home but he is developed lymphedema and superficial blisters on the left lower extremity and hence came in for a review 06/24/16; the remaining wound is on his left anterior leg. He still has edema coming from between his toes. There is lymphedema here however his edema is generally better than when I last saw this. He has a history of atrial fibrillation but does not have a known history of congestive heart failure nevertheless I think he probably has this at least on a diastolic basis. 07/01/16 I reviewed his echocardiogram from January 2017. This was essentially normal. He did not have LVH, EF of 55-60%. His right ventricular function was normal although he did have trivial tricuspid and pulmonic regurgitation. This is not audible on exam however. I increased his Lasix to do massive edema in his legs well above his knees I think in early July. He was also drinking an excessive amount of water at the time. 07/15/16; missed his appointment last week because of the Labor Day holiday on Monday. He could not get another appointment later in the week. Started to feel the wrap  digging in superiorly so we remove the top half and the bottom half of his wrap. He has extensive erythema and blistering superiorly in the left leg. Very tender. Very swollen. Edema in his foot with leaking edema fluid. He has not been systemically unwell 07/22/16; the area on the left leg laterally required some debridement. The medial wounds look more stable. His wrap injury wounds appear to have healed. Edema and his foot is better, weeping edema is also better. He tells me he is meeting with the supplier of the external compression pumps at work 08/05/16;  the patient was on vacation last week in [facility]. His wrap is been on for an extended period of time. Also over the weekend he developed an extensive area of tender erythema across his anterior medial thigh. He took to doxycycline yesterday that he had leftover from a previous prescription. The patient complains of weeping edema coming out of his toes 08/08/16; I saw this patient on 10/2. He was tender across his anterior thigh. I put him on doxycycline. He returns today in follow-up. He does not have any open wounds on his lower leg, he still has edema weeping into his toes. 08/12/16; patient was seen back urgently today to follow-up for his extensive left thigh cellulitis/erysipelas. He comes back with a lot less swelling and erythema pain is much better. I believe I gave him Augmentin and Cipro. His wrap was cut down as he stated a roll down his legs. He developed blistering above the level of the wrap that remained. He has 2 open blisters and 1 intact. 08/19/16; patient is been doing his primary doctor who is increased his Lasix from 40-80 once a day or 80 already has less edema. Cellulitis has remained improved in the left thigh. 2 open areas on the posterior left calf 08/26/16; he returns today having new open blisters on the anterior part of his left leg. He has his compression pumps but is not yet been shown how to use some vital  representative from the supplier. 09/02/16 patient returns today with no open wounds on the left leg. Some maceration in his plantar toes 09/10/2016 -- Dr. Leanord Hawking had recently discharged him on 09/02/2016 and he has come right back with redness swelling and some open ulcers on his left lower extremity. He says this was caused by trying to apply his compression stockings and he's been unable to use this and has not been able to use his lymphedema pumps. He had some doxycycline leftover and he has started on this a few days ago. 09/16/16; there are no open wounds on his leg on the left and no evidence of cellulitis. 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 He does not complain of symptoms here. I am not clear use using his external compression pumps. 09/23/16; I have not seen this patient in 2 weeks. He canceled his appointment 10 days ago as he was going on vacation. He tells me that on Monday he noticed a large area on his posterior left leg which is been draining copiously and is reopened into a large wound. He is been using ABDs and the external part of his juxtalite, according to our nurse this was not on properly. 10/07/16; Still a substantial area on the posterior left leg. Using silver alginate 10/14/16; in general better although there is still open area which looks healthy. Still using silver alginate. He reminds me that this happen before he left for [facility]. Today while he was showering in the morning. He had been using his juxtalite's 10/21/16; the area on his posterior left leg is fully epithelialized. However he arrives today with a large area of tender erythema in his medial and posterior left thigh just above the knee. I have marked the area. Once again he is reluctant to consider hospitalization. I treated him with oral antibiotics in the past for a similar situation with resolution I think with doxycycline however  this area it seems more extensive to me. He is not complaining of fever  but does have chills and says states he is thirsty. His blood sugar today was in the 140s at home 10/25/16 the area on his posterior left leg is fully epithelialized although there is still some weeping edema. The large area of tenderness and erythema in his medial and posterior left thigh is a lot less tender although there is still a lot of swelling in this thigh. He states he feels a lot better. He is on doxycycline and Augmentin that I started last week. This will continued until Tuesday, December 26. I have ordered a duplex ultrasound of the left thigh rule out DVT whether there is an abscess something that would need to be drained I would also like to know. 11/01/16; he still has weeping edema from a not fully epithelialized area on his left posterior calf. Most of the rest of this looks a lot better. He has completed his antibiotics. His thigh is a lot better. Duplex ultrasound did not show a DVT in the thigh 11/08/16; he comes in today with more Denuded surface epithelium from the posterior aspect of his calf. There is no real evidence of cellulitis. The superior aspect of his wrap appears to have put quite an indentation in his leg just below the knee and this may have contributed. He does not complain of pain or fever. We have been using silver alginate as the primary dressing. The area of cellulitis in the right thigh has totally resolved. He has been using his compression stockings once a week 11/15/16; the patient arrives today with more loss of epithelium from the posterior aspect of his left calf. He now has a fairly substantial wound in this area. The reason behind this deterioration isn't exactly clear although his edema is not well controlled. He states he feels he is generally more swollen systemically. He is not complaining of chest pain shortness of breath fever. Tells me he has an appointment with his primary  physician in early February. He is on 80 mg of oral Lasix a day. He claims compliance with the external compression pumps. He is not having any pain in his legs similar to what he has with his recurrent cellulitis 11/22/16; the patient arrives a follow-up of his large area on his left lateral calf. This looks somewhat better today. He came in earlier in the week for a dressing change since I saw him a week ago. He is not complaining of any pain no shortness of breath no chest pain 11/28/16; the patient arrives for follow-up of his large area on the left lateral calf this does not look better. In fact it is larger weeping edema. The surface of the wound does not look too bad. We have been using silver alginate although I'm not certain that this is a dressing issue. 12/05/16; again the patient follows up for a large wound on the left lateral and left posterior calf this does not look better. There continues to be weeping edema necrotic surface tissue. More worrisome than this once again there is erythema below the wound involving the distal Achilles and heel suggestive of cellulitis. He is on his feet working most of the day of this is not going well. We are changing his dressing twice a week to facilitate the drainage. 12/12/16; not much change in the overall dimensions of the large area on the left posterior calf. This is very inflamed looking. I gave him an. Doxycycline last week does not really seem to have helped. He found the wrap  very painful indeed it seems to of dog into his legs superiorly and perhaps around the heel. He came in early today because the drainage had soaked through his dressings. 12/19/16- patient arrives for follow-up evaluation of his left lower extremity ulcers. He states that he is using his lymphedema pumps once daily when there is "no drainage". He admits to not using his lipedema pumps while under current treatment. His blood sugars have been consistently between  150-200. 12/26/16; the patient is not using his compression pumps at home because of the wetness on his feet. I've advised him that I think it's important for him to use this daily. He finds his feet too wet, he can put a plastic bag over his legs while he is in the pumps. Otherwise I think will be in a vicious circle. We are using silver alginate to the major area on his left posterior calf 01/02/17; the patient's posterior left leg has further of all into 3 open wounds. All of them covered with a necrotic surface. He claims to be using his compression pumps once a day. His edema control is marginal. Continue with silver alginate 01/10/17; the patient's left posterior leg actually looks somewhat better. There is less edema, less erythema. Still has 3 open areas covered with a necrotic surface requiring debridement. He claims to be using his compression pumps once a day his edema control is better 01/17/17; the patient's left posterior calf look better last week when I saw him and his wrap was changed 2 days ago. He has noted increasing pain in the left heel and arrives today with much larger wounds extensive erythema extending down into the entire heel area especially tender medially. He is not systemically unwell CBGs have been controlled no fever. Our intake nurse showed me limegreen drainage on his AVD pads. 01/24/17; his usual this patient responds nicely to antibiotics last week giving him Levaquin for presumed Pseudomonas. The whole entire posterior part of his leg is much better much less inflamed and in the case of his Achilles heel area much less tender. He has also had some epithelialization posteriorly there are still open areas here and still draining but overall considerably better 01/31/17- He has continue to tolerate the compression wraps. he states that he continues to use the lymphedema pumps daily, and can increase to twice daily on the weekends. He is voicing no complaints or concerns  regarding his LLE ulcers 02/07/17-he is here for follow-up evaluation. He states that he noted some erythema to the left medial and anterior thigh, which he states is new as of yesterday. He is concerned about recurrent cellulitis. He states his blood sugars have been slightly elevated, this morning in the 180s 02/14/17; he is here for follow-up evaluation. When he was last here there was erythema superiorly from his posterior wound in his anterior thigh. He was prescribed Levaquin however a culture of the wound surface grew MRSA over the phone I changed him to doxycycline on Monday and things seem to be a lot better. 02/24/17; patient missed his appointment on Friday therefore we changed his nurse visit into a physician visit today. Still using silver alginate on the large area of the posterior left thigh. He isn't new area on the dorsal left second toe 03/03/17; actually better today although he admits he has not used his external compression pumps in the last 2 days or so because of work responsibilities over the weekend. 03/10/17; continued improvement. External compression pumps once a day almost  all of his wounds have closed on the posterior left calf. Better edema control 03/17/17; in general improved. He still has 3 small open areas on the lateral aspect of his left leg however most of the area on the posterior part of his leg is epithelialized. He has better edema control. He has an ABD pad under his stocking on the right anterior lower leg although he did not let us look at that today. 03/24/17; patient arrives back in clinic today with no open areas however there are areas on the posterior left calf and anterior left calf that are less than 100% epithelialized. His edema is well controlled in the left lower leg. There is some pitting edema probably lymphedema in the left upper thigh. He uses compression pumps at home once per day. I tried to get him to do this twice a day although he is very  reticent. 04/01/2017 -- for the last 2 days he's had significant redness, tenderness and weeping and came in for an urgent visit today. 04/07/17; patient still has 6 more days of doxycycline. He was seen by Dr. Con Memos last Wednesday for cellulitis involving the posterior aspect, lateral aspect of his Involving his heel. For the most part he is better there is less erythema and less weeping. He has been on his feet for 12 hours 2 over the weekend. Using his compression pumps once a day 04/14/17 arrives today with continued improvement. Only one area on the posterior left calf that is not fully epithelialized. He has intense bilateral venous inflammation associated with his chronic venous insufficiency disease and secondary lymphedema. We have been using silver alginate to the left posterior calf wound In passing he tells [place] today that the right leg but we have not seen in quite some time has an open area on it but he doesn't want [place] to look at this today states he will show this to [place] next week. 04/21/17; there is no open area on his left leg although he still reports some weeping edema. He showed [place] his right leg today which is the first time we've seen this leg in a long time. He has a large area of open wound on the right leg anteriorly healthy granulation. Quite a bit of swelling in the right leg and some degree of venous inflammation. He told us about the right leg in passing last week but states that deterioration in the right leg really only happened over the weekend 04/28/17; there is no open area on the left leg although there is an irritated part on the posterior which is like a wrap injury. The wound on the right leg which was new from last week at least to [place] is a lot better. 05/05/17; still no open area on the left leg. Patient is using his new compression stocking which seems to be doing a good job of controlling the edema. He states he is using his compression pumps once per day. The  right leg still has an open wound although it is better in terms of surface area. Required debridement. A lot of pain in the posterior right Achilles marked tenderness. Usually this type of presentation this patient gives concern for an active cellulitis 05/12/17; patient arrives today with his major wound from last week on the right lateral leg somewhat better. Still requiring debridement. He was using his compression stocking on the left leg however that is reopened with superficial wounds anteriorly he did not have an open wound on this leg  previously. He is still using his juxta light's once daily at night. He cannot find the time to do this in the morning as he has to be at work by 7 AM 05/19/17; right lateral leg wound looks improved. No debridement required. The concerning area is on the left posterior leg which appears to almost have a subcutaneous hemorrhagic component to it. We've been using silver alginate to all the wounds 05/26/17; the right lateral leg wound continues to look improved. However the area on the left posterior calf is a tightly adherent surface. [place] using silver alginate. Because of the weeping edema in his legs there is very little good alternatives. 06/02/17; the patient left here last week looking quite good. Major wound on the left posterior calf and a small one on the right lateral calf. Both of these look satisfactory. He tells me that by Wednesday he had noted increased pain in the left leg and drainage. He called on Thursday and Friday to get an appointment here but we were blocked. He did not go to urgent care or his primary physician. He thinks he had a fever on Thursday but did not actually take his temperature. He has not been using his compression pumps on the left leg because of pain. I advised him to go to the emergency room today for IV antibiotics for stents of left leg cellulitis but he has refused I have asked him to take 2 days off work to keep his  leg elevated and he has refused this as well. In view of this I'm going to call him and Augmentin and doxycycline. 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 However after a detailed discussion regarding the risk benefits and alternatives he refuses to get admitted to the hospital. With no other recourse I will continue him on oral antibiotics as before and hopefully he'll have his infectious disease consultation this week. 06/16/2017 -- the patient was seen today by the nurse practitioner at infectious disease Ms. Dixon. 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 He has also been advise Lotrimin cream locally. she also had wise IV antibiotics for his condition if it flares up 06/23/17; patient arrives today with drainage bilaterally although the remaining wound on the left posterior calf after cleaning up today "highlighter yellow drainage" did not look too bad. Unfortunately he has had breakdown on the right anterior leg [previously this leg had not been open and he is using a black stocking] he went to see infectious disease and is been put on clindamycin 150 mg daily, I did not verify the dose although I'm not familiar with using clindamycin in this dosing range, perhaps for prophylaxisoo 06/27/17; I brought this patient back today to follow-up on the wound deterioration on the right lower leg together with surrounding cellulitis. I started him on doxycycline 4 days ago. This area looks better however he comes in today with intense cellulitis on the medial part of his left  thigh. This is not have a wound in this area. Extremely tender. We've been using silver alginate to  the wounds on the right lower leg left lower leg with bilateral 4 layer compression he is using his external compression pumps once a day 07/04/17; patient's left medial thigh cellulitis looks better. He has not been using his compression pumps as his insert said it was contraindicated with cellulitis. His right leg continues to make improvements all the wounds are still open. We only have one remaining wound on the left posterior calf. Using silver alginate to all open areas. He is on doxycycline which I started a week ago and should be finishing I gave him Augmentin after Thursday's visit for the severe cellulitis on the left medial thigh which fortunately looks better 07/14/17; the patient's left medial thigh cellulitis has resolved. The cellulitis in his right lower calf on the right also looks better. All of his wounds are stable to improved we've been using silver alginate he has completed the antibiotics I have given him. He has clindamycin 150 mg once a day prescribed by infectious disease for prophylaxis, I've advised him to start this now. We have been using bilateral Unna boots over silver alginate to the wound areas 07/21/17; the patient is been to see infectious disease who noted his recurrent problems with cellulitis. He was not able to tolerate prophylactic clindamycin therefore he is on amoxicillin 500 twice a day. He also had a second daily dose of Lasix added By Dr. Oneta Rack but he is not taking this. Nor is he being completely compliant with his compression pumps a especially not this week. He has 2 remaining wounds one on the right posterior lateral lower leg and one on the left posterior medial lower leg. 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 The patient has Unna boots bilaterally. Still wounds on his  right lateral, left medial, and a new open area on the left anterior lateral lower leg 08/04/17; he remains on amoxicillin twice a day for prophylaxis of recurrent cellulitis. He has bilateral Unna boots for compression and silver alginate to his wounds. Arrives today with his legs looking as good as I have seen him in quite some time. Not surprisingly his wounds look better as well with improvement on the right lateral leg venous insufficiency wound and also the left medial leg. He is still using the compression pumps once a day 08/11/17; both legs appear to be doing better wounds on the right lateral and left medial legs look better. Skin on the right leg quite good. He is been using silver alginate as the primary dressing. I'm going to use Anasept gel calcium alginate and maintain all the secondary dressings 08/18/17; the patient continues to actually do quite well. The area on his right lateral leg is just about closed the left medial also looks better although it is still moist in this area. His edema is well controlled we have been using Anasept gel with calcium alginate and the usual secondary dressings, 4 layer compression and once daily use of his compression pumps "always been able to manage 09/01/17; the patient continues to do reasonably well in spite of his trip to [place]. The area on the right lateral leg is epithelialized. Left is much better but still open. He has more edema and more chronic erythema on the left leg [venous inflammation] 09/08/17; he arrives today with no open wound on the right lateral leg and decently controlled edema. Unfortunately his left leg is not nearly as in his good situation as last  week.he apparently had increasing edema starting on Saturday. He edema soaked through into his foot so used a plastic bag to walk around his home. The area on the medial right leg which was his open area is about the same however he has lost surface epithelium on the  left lateral which is new and he has significant pain in the Achilles area of the left foot. He is already on amoxicillin chronically for prophylaxis of cellulitis in the left leg 09/15/17; he is completed a week of doxycycline and the cellulitis in the left posterior leg and Achilles area is as usual improved. He still has a lot of edema and fluid soaking through his dressings. There is no open wound on the right leg. He saw infectious disease NP today 09/22/17;As usual 1 we transition him from our compression wraps to his stockings things did not go well. He has several small open areas on the right leg. He states this was caused by the compression wrap on his skin although he did not wear this with the stockings over them. He has several superficial areas on the left leg medially laterally posteriorly. He does not have any evidence of active cellulitis especially involving the left Achilles The patient is traveling [facility] Saturday going to [facility]. He states he isn't attempting to get an appointment with a heel objects wound center there to change his dressings. I am not completely certain whether this will work 10/06/17; the patient came in on Friday for a nurse visit and the nurse reported that his legs actually look quite good. He arrives in clinic today for his regular follow-up visit. He has a new wound on his left third toe over the PIP probably caused by friction with his footwear. He has small areas on the left leg and a very superficial but epithelialized area on the right anterior lateral lower leg. Other than that his legs look as good as I've seen him in quite some time. We have been using silver alginate Review of systems; no chest pain no shortness of breath other than this a 10 point review of systems negative 10/20/17; seen by Dr. Meyer Russel last week. He had taken some antibiotics [doxycycline] that he had left over. Dr. Meyer Russel thought he had candida infection and  declined to give him further antibiotics. He has a small wound remaining on the right lateral leg several areas on the left leg including a larger area on the left posterior several left medial and anterior and a small wound on the left lateral. The area on the left dorsal third toe looks a lot better. ROS; Gen.; no fever, respiratory no cough no sputum Cardiac no chest pain other than this 10 point review of system is negative 10/30/17; patient arrives today having fallen in the bathtub 3 days ago. It took him a while to get up. He has pain and maceration in the wounds on his left leg which have deteriorated. He has not been using his pumps he also has some maceration on the right lateral leg. 11/03/17; patient continues to have weeping edema especially in the left leg. This saturates his dressings which were just put on on 12/27. As usual the doxycycline seems to take care of the cellulitis on his lower leg. He is not complaining of fever, chills, or other systemic symptoms. He states his leg feels a lot better on the doxycycline I gave him empirically. He also apparently gets injections at his primary doctor's officeo Rocephin for  cellulitis prophylaxis. I didn't ask him about his compression pump compliance today I think that's probably marginal. Arrives in the clinic with all of his dressings primary and secondary macerated full of fluid and he has bilateral edema 11/10/17; the patient's right leg looks some better although there is still a cluster of wounds on the right lateral. The left leg is inflamed with almost circumferential skin loss medially to laterally although we are still maintaining anteriorly. He does not have overt cellulitis there is a lot of drainage. He is not using compression pumps. We have been using silver alginate to the wound areas, there are not a lot of options here 11/17/17; the patient's right leg continues to be stable although there is still open wounds, better than  last week. The inflammation in the left leg is better. Still loss of surface layer epithelium especially posteriorly. There is no overt cellulitis in the amount of edema and his left leg is really quite good, tells me he is using his compression pumps once a day. 11/24/17; patient's right leg has a small superficial wound laterally this continues to improve. The inflammation in the left leg is still improving however we have continuous surface layer epithelial loss posteriorly. There is no overt cellulitis in the amount of edema in both legs is really quite good. He states he is using his compression pumps on the left leg once a day for 5 out of 7 days 12/01/17; very small superficial areas on the right lateral leg continue to improve. Edema control in both legs is better today. He has continued loss of surface epithelialization and left posterior calf although I think this is better. We have been using silver alginate with large number of absorptive secondary dressings 4 layer on the left Unna boot on the right at his request. He tells me he is using his compression pumps once a day 12/08/17; he has no open area on the right leg is edema control is good here. On the left leg however he has marked erythema and tenderness breakdown of skin. He has what appears to be a wrap injury just distal to the popliteal fossa. 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 There was swelling erythema and breakdown in the right leg. He has much worse skin breakdown in the left leg as well multiple open areas medially and posteriorly as well as laterally. He tells me he has been using his compression pumps but tells me he feels that the  drainage out of his leg is worse when he uses a compression pumps. To be fair to him he is been saying this for a while however I don't know that I have really been listening to this. I wonder if the compression pumps are working properly 12/22/17;. Once again he arrives with severe erythema, weeping edema from the left greater than right leg. Noncompliance with compression pumps. New this visit he is complaining of pain on the lateral aspect of the right leg and the medial aspect of his right thigh. He apparently saw his cardiologist Dr. [name] who was ordered an echocardiogram area and I think this is a step in the right direction 12/25/17; started his doxycycline Monday night. There is still intense erythema  of the right leg especially in the anterior thigh although there is less tenderness. The erythema around the wound on the right lateral calf also is less tender. He still complaining of pain in the left heel. His wounds are about the same right lateral left medial left lateral. Superficial but certainly not close to closure. He denies being systemically unwell no fever chills no abdominal pain no diarrhea 12/29/17; back in follow-up of his extensive right calf and right thigh cellulitis. I added amoxicillin to cover possible doxycycline resistant strep. This seems to of done the trick he is in much less pain there is much less erythema and swelling. He has his echocardiogram at 11:00 this morning. X-ray of the left heel was also negative. 01/05/18; the patient arrived with his edema under much better control. Now that he is retired he is able to use his compression pumps daily and sometimes twice a day per the patient. He has a wound on the right leg the lateral wound looks better. Area on the left leg also looks a lot better. He has no evidence of cellulitis in his bilateral thighs I had a quick peak at his echocardiogram. He is in normal ejection fraction and normal left ventricular function.  He has moderate pulmonary hypertension moderately reduced right ventricular function. One would have to wonder about chronic sleep apnea although he says he doesn't snore. He'll review the echocardiogram with his cardiologist. 01/12/18; the patient arrives with the edema in both legs under exemplary control. He is using his compression pumps daily and sometimes twice daily. His wound on the right lateral leg is just about closed. He still has some weeping areas on the posterior left calf and lateral left calf although everything is just about closed here as well. I have spoken with Aldean Baker who is the patient's nurse practitioner and infectious disease. She was concerned that the patient had not understood that the parenteral penicillin injections he was receiving for cellulitis prophylaxis was actually benefiting him. I don't think the patient actually saw that I would tend to agree we were certainly dealing with less infections although he had a serious one last month. 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 He is healed. He'll be placed in juxtalite compression wraps and increase his lymphedema pumps to twice daily. We will follow up again next week to ensure there are no issues with the new regiment. 01/20/18-he is here for evaluation of bilateral lower extremity weeping edema. Yesterday he was placed in compression wrap to the right lower extremity and compression stocking to left lower shrubbery. He states he uses lymphedema pumps last night and again this morning and noted a blister to the left lower extremity. On exam he was noted to have drainage to the right lower extremity. He will be placed in Unna boots bilaterally and follow-up next week 01/26/18; patient was actually discharged a week ago to his own juxta light stockings only to return the next day with bilateral lower extremity weeping edema.he was placed in bilateral Unna boots. He  arrives today with pain in the back of his left leg. There is no open area on the right leg however there is a linear/wrap injury on the left leg and weeping edema on the left leg posteriorly. I spoke with infectious disease about 10 days ago. They were disappointed that the patient elected to discontinue prophylactic intramuscular penicillin shots as they felt it was particularly beneficial in reducing the frequency of  his cellulitis. I discussed this with the patient today. He does not share this view. He'll definitely need antibiotics today. Finally he is traveling to [place] and trauma leaving this Saturday and returning a week later and he does not travel with his pumps. He is going by car 01/30/18; patient was seen 4 days ago and brought back in today for review of cellulitis in the left leg posteriorly. I put him on amoxicillin this really hasn't helped as much as I might like. He is also worried because he is traveling to [facility] trauma by car. Finally we will be rewrapping him. There is no open area on the right leg over his left leg has multiple weeping areas as usual 02/09/18; The same wrap on for 10 days. He did not pick up the last doxycycline I prescribed for him. He apparently took 4 days worth he already had. There is nothing open on his right leg and the edema control is really quite good. He's had damage in the left leg medially and laterally especially probably related to the prolonged use of Unna boots 02/12/18; the patient arrived in clinic today for a nurse visit/wrap change. He complained of a lot of pain in the left posterior calf. He is taking doxycycline that I previously prescribed for him. Unfortunately even though he used his stockings and apparently used to compression pumps twice a day he has weeping edema coming out of the lateral part of his right leg. This is coming from the lower anterior lateral skin area. 02/16/18; the patient has finished his doxycycline and  will finish the amoxicillin 2 days. The area of cellulitis in the left calf posteriorly has resolved. He is no longer having any pain. He tells me he is using his compression pumps at least once a day sometimes twice. 02/23/18; the patient finished his doxycycline and Amoxil last week. On Friday he noticed a small erythematous circle about the size of a quarter on the left lower leg just above his ankle. This rapidly expanded and he now has erythema on the lateral and posterior part of the thigh. This is bright red. Also has an area on the dorsal foot just above his toes and a tender area just below the left popliteal fossa. He came off his prophylactic penicillin injections at his own insistence one or 2 months ago. This is obviously deteriorated since then 03/02/18; patient is on doxycycline and Amoxil. Culture I did last week of the weeping area on the back of his left calf grew group B strep. I have therefore renewed the amoxicillin 500 3 times a day for a further week. He has not been systemically unwell. Still complaining of an area of discomfort right under his left popliteal fossa. There is no open wound on the right leg. He tells me that he is using his pumps twice a day on most days 03/09/18; patient arrives in clinic today completing his amoxicillin today. The cellulitis on his left leg is better. Furthermore he tells me that he had intramuscular penicillin shots that his primary care office today. However he also states that the wrap on his right leg fell down shortly after leaving clinic last week. He developed a large blister that was present when he came in for a nurse visit later in the week and then he developed intense discomfort around this area.He tells me he is using his compression pumps 03/16/18; the patient has completed his doxycycline. The infectious part of this/cellulitis in the left heel  area left popliteal area is a lot better. He has 2 open areas on the right calf. Still  areas on the left calf but this is a lot better as well. 03/24/18; the patient arrives complaining of pain in the left popliteal area again. He thinks some of this is wrap injury. He has no open area on the right leg and really no open area on the left calf either except for the popliteal area. He claims to be compliant with the compression pumps 03/31/18; I gave him doxycycline last week because of cellulitis in the left popliteal area. This is a lot better although the surface epithelium is denuded off and response to this. He arrives today with uncontrolled edema in the right calf area as well as a fingernail injury in the right lateral calf. There is only a few open areas on the left 04/06/18; I gave him amoxicillin doxycycline over the last 2 weeks that the amoxicillin should be completing currently. He is not complaining of any pain or systemic symptoms. The only open areas see has is on the right lateral lower leg paradoxically I cannot see anything on the left lower leg. He tells me he is using his compression pumps twice a day on most days. Silver alginate to the wounds that are open under 4 layer compression 04/13/18; he completed antibiotics and has no new complaints. Using his compression pumps. Silver alginate that anything that's opened 04/20/18; he is using his compression pumps religiously. Silver alginate 4 layer compression anything that's opened. He comes in today with no open wounds on the left leg but 3 on the right including a new one posteriorly. He has 2 on the right lateral and one on the right posterior. He likes Unna boots on the right leg for reasons that aren't really clear we had the usual 4 layer compression on the left. It may be necessary to move to the 4 layer compression on the right however for now I left them in the Unna boots 04/27/18; he is using his compression pumps at least once a day. He has still the wounds on the right lateral calf. The area right posteriorly  has closed. He does not have an open wound on the left under 4 layer compression however on the dorsal left foot just proximal to the toes and the left third toe 2 small open areas were identified 05/11/18; he has not uses compression pumps. The areas on the right lateral calf have coalesced into one large wound necrotic surface. On the left side he has one small wound anteriorly however the edema is now weeping out of a large part of his left leg. He says he wasn't using his pumps because of the weeping fluid. I explained to him that this is the time he needs to pump more 05/18/18; patient states he is using his compression pumps twice a day. The area on the right lateral large wound albeit superficial. On the left side he has innumerable number of small new wounds on the left calf particularly laterally but several anteriorly and medially. All these appear to have healthy granulated base these look like the remnants of blisters however they occurred under compression. The patient arrives in clinic today with his legs somewhat better. There is certainly less edema, less multiple open areas on the left calf and the right anterior leg looks somewhat better as well superficial and a little smaller. However he relates pain and erythema over the last 3-4 days in the  thigh and I looked at this today. He has not been systemically unwell no fever no chills no change in blood sugar values 05/25/18; comes in today in a better state. The severe cellulitis on his left leg seems better with the Keflex. Not as tender. He has not been systemically unwell Hard to find an open wound on the left lower leg using his compression pumps twice a day The confluent wounds on his right lateral calf somewhat better looking. These will ultimately need debridement I didn't do this today. 06/01/18; the severe cellulitis on the left anterior thigh has resolved and he is completed his Keflex. There is no open wound on the left leg  however there is a superficial excoriation at the base of the third toe dorsally. Skin on the bottom of his left foot is macerated looking. The left the wounds on the lateral right leg actually looks some better although he did require debridement of the top half of this wound area with an open curet 06/09/18 on evaluation today patient appears to be doing poorly in regard to his right lower extremity in particular this appears to likely be infected he has very thick purulent discharge along with a bright green tent to the discharge. This makes me concerned about the possibility of pseudomonas. He's also having increased discomfort at this point on evaluation. Fortunately there does not appear to be any evidence of infection spreading to the other location at this time. 06/16/18 on evaluation today patient appears to actually be doing fairly well. His ulcer has actually diminished in size quite significantly at this point which is good news. Nonetheless he still does have some evidence of infection he did see infectious disease this morning before coming here for his appointment. I did review the results of their evaluation and their note today. They did actually have him discontinue the Cipro and initiate treatment with linezolid at this time. He is doing this for the next seven days and they recommended a follow-up in four months with them. He is the keep a log of the need for intermittent antibiotic therapy between now and when he falls back up with infectious disease. This will help them gaze what exactly they need to do to try and help them out. 06/23/18; the patient arrives today with no open wounds on the left leg and left third toe healed. He is been using his compression pumps twice a day. On the right lateral leg he still has a sizable wound but this is a lot better than last time I saw this. In my absence he apparently cultured MRSA coming from this wound and is completed a course of  linezolid as has been directed by infectious disease. Has been using silver alginate under 4 layer compression 06/30/18; the only open wound he has is on the right lateral leg and this looks healthy. No debridement is required. We have been using silver alginate. He does not have an open wound on the left leg. There is apparently some drainage from the dorsal proximal third toe on the left although I see no open wound here. 07/03/18 on evaluation today patient was actually here just for a nurse visit rapid change. However when he was here on Wednesday for his rat change due to having been healed on the left and then developing blisters we initiated the wrap again knowing that he would be back today for [place] to reevaluate and see were at. Unfortunately he has developed some cellulitis into the  proximal portion of his right lower extremity even into the region of his thigh. 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 He was placed on one as what at that point. Nonetheless he is done with that and has been tolerating it well otherwise. Doxycycline which in the past really did not seem to be effective for him. Nonetheless I think the best option may be for [place] to definitely reinitiate the antibiotics for a longer period of time. 07/07/18; since I last saw this patient a week ago he has had a difficult time. At that point he did not have an open wound on his left leg. We transitioned him into juxta light stockings. He was apparently in the clinic the next day with blisters on the left lateral and left medial lower calf. He also had weeping edema fluid. He was put back into a compression wrap. He was also in the clinic on Friday with intense erythema in his right thigh. Per the patient he was started on Bactrim however that didn't work at all in terms of relieving his pain and swelling. He has taken 3 doxycycline that he had left over from last time and that seems to of  helped. He has blistering on the right thigh as well. 07/14/18; the erythema on his right thigh has gotten better with doxycycline that he is finishing. 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 Presumably this cellulitis in the thigh was not related to that although I think this is a bit concerning going forward. He still has an area on the right lateral calf the blister on the right medial calf just below the knee that was discussed above. On the left 2 small open areas left medial and left lateral. Edema control is adequate. He is using his compression pumps twice a day 07/20/18; continued improvement in the condition of both legs especially the edema in his bilateral thighs. He tells me he is been losing weight through a combination of diet and exercise. He is using his compression pumps twice a day. So overall she made to the remaining wounds 07/27/2018; continued improvement in condition of both legs. His edema is well controlled. The area on the right lateral leg is just about closed he had one blisters show up on the medial left upper calf. We have him in 4 layer compression. He is going on a 10-day trip to [place], [place]. He will be driving. He wants to wear Unna boots because of the lessening amount of constriction. He will not use compression pumps while he is away 08/05/18 on evaluation today patient actually appears to be doing decently well all things considered in regard to his bilateral lower extremities. The worst ulcer is actually only posterior aspect of his left lower extremity with a four layer compression wrap cut into his leg a couple weeks back. He did have a trip and actually had [employer] for the trip that he is worn since he was last here. Nonetheless he feels like the [employer] actually do better for him his swelling is up a little bit but he also with his trip was  not taking his Lasix on a regular set schedule like he was supposed to be. He states that obviously the reason being that he cannot drive and keep going without having to urinate too frequently  which makes it difficult. He did not have his pumps with him while he was away either which I think also maybe playing a role here too. 08/13/2018; the patient only has a small open wound on the right lateral calf which is a big improvement in the last month or 2. He also has the area posteriorly just below the posterior fossa on the left which I think was a wrap injury from several weeks ago. He has no current evidence of cellulitis. He tells me he is back into his compression pumps twice a day. He also tells me that while he was at the laundromat somebody stole a section of his extremitease stockings 08/20/2018; back in the clinic with a much improved state. He only has small areas on the right lateral mid calf which is just about healed. This was is more substantial area for quite a prolonged period of time. He has a small open area on the left anterior tibia. The area on the posterior calf just below the popliteal fossa is closed today. He is using his compression pumps twice a day 08/28/2018; patient has no open wound on the right leg. He has a smattering of open areas on the calf with some weeping lymphedema. More problematically than that it looks as though his wraps of slipped down in his usual he has very angry upper area of edema just below the right medial knee and on the right lateral calf. He has no open area on his feet. The patient is traveling to [facility] next week. I will send him in an antibiotic. We will continue to wrap the right leg. We ordered extremitease stockings for him last week and I plan to transition the right leg to a stocking when he gets home which will be in 10 days time. As usual he is very reluctant to take his pumps with him when he travels 09/07/2018; patient  returns from [facility]. He shows me a picture of his left leg in the mid part of his trip last week with intense fire engine erythema. The picture look bad enough I would have considered sending him to the hospital. Instead he went to the wound care center in [facility]. They did not prescribe him antibiotics but he did take some doxycycline he had leftover from a previous visit. I had given him trimethoprim sulfamethoxazole before he left this did not work according to the patient. This is resulted in some improvement fortunately. He comes back with a large wound on the left posterior calf. Smaller area on the left anterior tibia. Denuded blisters on the dorsal left foot over his toes. Does not have much in the way of wounds on the right leg although he does have a very tender area on the right posterior area just below the popliteal fossa also suggestive of infection. He promises me he is back on his pumps twice a day 09/15/2018; the intense cellulitis in his left lower calf is a lot better. The wound area on the posterior left calf is also so better. However he has reasonably extensive wounds on the dorsal aspect of his second and third toes and the proximal foot just at the base of the toes. There is nothing open on the right leg 09/22/2018; the patient has excellent edema control in his legs bilaterally. He is using his external compression pumps twice a day. He has no open area on the right leg and only the areas in the left foot dorsally  second and third toe area on the left side. He does not have any signs of active cellulitis. 10/06/2018; the patient has good edema control bilaterally. He has no open wound on the right leg. There is a blister in the posterior aspect of his left calf that we had to deal with today. He is using his compression pumps twice a day. There is no signs of active cellulitis. We have been using silver alginate to the wound areas. He still  has vulnerable areas on the base of his left first second toes dorsally He has a his extremities stockings and we are going to transition him today into the stocking on the right leg. He is cautioned that he will need to continue to use the compression pumps twice a day. If he notices uncontrolled edema in the right leg he may need to go to 3 times a day. 10/13/2018; the patient came in for a nurse check on Friday he has a large flaccid blister on the right medial calf just below the knee. We unroofed this. He has this and a new area underneath the posterior mid calf which was undoubtedly a blister as well. He also has several small areas on the right which is the area we put his extremities stocking on. 10/19/2018; the patient went to see infectious disease this morning I am not sure if that was a routine follow-up in any case the doxycycline I had given him was discontinued and started on linezolid. He has not started this. It is easy to look at his left calf and the inflammation and think this is cellulitis however he is very tender in the tissue just below the popliteal fossa and I have no doubt that there is infection going on here. He states the problem he is having is that with the compression pumps the edema goes down and then starts walking the wrap falls down. We will see if we can adhere this. He has 1 or 2 minuscule open areas on the right still areas that are weeping on the posterior left calf, the base of his left second and third toes 10/26/18; back today in clinic with quite of skin breakdown in his left anterior leg. This may have been infection the area below the popliteal fossa seems a lot better however tremendous epithelial loss on the left anterior mid tibia area over quite inexpensive tissue. He has 2 blisters on the right side but no other open wound here. 10/29/2018; came in urgently to see [place] today and we worked him in for review. He states that the 4 layer compression  on the right leg caused pain he had to cut it down to roughly his mid calf this caused swelling above the wrap and he has blisters and skin breakdown today. As a result of the pain he has not been using his pumps. Both legs are a lot more edematous and there is a lot of weeping fluid. 11/02/18; arrives in clinic with continued difficulties in the right leg> left. Leg is swollen and painful. multiple skin blisters and new open areas especially laterally. He has not been using his pumps on the right leg. 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 He is not systemically unwell. 11/09/2018; the patient claims he is being compliant with his pumps. He is finished the doxycycline I gave him last week. Culture I did of the wound on the right lateral leg showed a few very resistant methicillin  staph aureus. This was resistant to doxycycline. Nevertheless he states the pain in the leg is a lot better which makes me wonder if the cultured organism was not really what was causing the problem nevertheless this is a very dangerous organism to be culturing out of any wound. His right leg is still a lot larger than the left. He is using an Haematologist on this area, he blames a 4-layer compression for causing the original skin breakdown which I doubt is true however I cannot talk him out of it. We have been using silver alginate to all of these areas which were initially blisters 11/16/2018; patient is being compliant with his external compression pumps at twice a day. Miraculously he arrives in clinic today with absolutely no open wounds. He has better edema control on the left where he has been using 4 layer compression versus wound of wounds on the right and I pointed this out to him. There is no inflammation in the skin in his lower legs which is also somewhat unusual for him. There is no open wounds on the dorsal left foot. He has extremitease stockings at home and I  have asked him to bring these in next week. 11/25/18 patient's lower extremity on examination today on the left appears for the most part to be wound free. He does have an open wound on the lateral aspect of the right lower extremity but this is minimal compared to what I've seen in past. He does request that we go ahead and wrap the left leg as well even though there's nothing open just so hopefully it will not reopen in short order. 1/28; patient has superficial open wounds on the right lateral calf left anterior calf and left posterior calf. His edema control is adequate. 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 We have been using silver alginate as the primary dressing. He claims compliance with his compression pumps 2/4; patient has superficial open wounds on numerous areas of his left calf and again one on the left dorsal foot. The areas on the right lateral calf have healed. The cellulitis that I gave him doxycycline for last week is also resolved this was mostly on the left anterior calf just below the tibial tuberosity. His edema looks fairly well-controlled. He tells me he went to see his primary doctor today and had blood work ordered 2/11; once again he has several open areas on the left calf left tibial area. Most of these are small and appear to have healthy granulation. He does not have anything open on the right. The edema and control in his thighs is pretty good which is usually a good indication he has been using his pumps as requested. 2/18; he continues to have several small areas on the left calf and left tibial area. Most of these are small healthy granulation. We put him in his stocking on the right leg last week and he arrives with a superficial open area over the right upper tibia and a fairly large area on the right lateral tibia in similar condition. His edema control actually does not  look too bad, he claims to be using his compression pumps twice a day 2/25. Continued small areas on the left calf and left tibial area. New areas especially on the right are identified just below the tibial tuberosity and on the right upper tibia itself. There are also areas of  weeping edema fluid even without an obvious wound. He does not have a considerable degree of lymphedema but clearly there is more edema here than his skin can handle. He states he is using the pumps twice a day. We have an Unna boot on the right and 4 layer compression on the left. 3/3; he continues to have an area on the right lateral calf and right posterior calf just below the popliteal fossa. There is a fair amount of tenderness around the wound on the popliteal fossa but I did not see any evidence of cellulitis, could just be that the wrap came down and rubbed in this area. He does not have an open area on the left leg however there is an area on the left dorsal foot at the base of the third toe We have been using silver alginate to all wound areas 3/10; he did not have an open area on his left leg last time he was here a week ago. Today he arrives with a horizontal wound just below the tibial tuberosity and an area on the left lateral calf. He has intense erythema and tenderness in this area. The area is on the right lateral calf and right posterior calf better than last week. We have been using silver alginate as usual 3/18 - Patient returns with 3 small open areas on left calf, and 1 small open area on right calf, the skin looks ok with no significant erythema, he continues the UNA boot on right and 4 layer compression on left. The right lateral calf wound is closed , the right posterior is small area. we will continue silver alginate to the areas. Culture results from right posterior calf wound is + MRSA sensitive to Bactrim but resistant to DOXY 01/27/19 on evaluation today patient's bilateral lower extremities  actually appear to be doing fairly well at this point which is good news. He is been tolerating the dressing changes without complication. Fortunately she has made excellent improvement in regard to the overall status of his wounds. Unfortunately every time we cease wrapping him he ends up reopening in causing more significant issues at that point. Again I'm unsure of the best direction to take although I think the lymphedema clinic may be appropriate for him. 02/03/19 on evaluation today patient appears to be doing well in regard to the wounds that we saw him for last week unfortunately he has a new area on the proximal portion of his right medial/posterior lower extremity where the wrap somewhat slowed down and caused swelling and a blister to rub and open. Unfortunately this is the only opening that he has on either leg at this point. 02/17/19 on evaluation today patient's bilateral lower extremities appear to be doing well. He still completely healed in regard to the left lower extremity. In regard to the right lower extremity the area where the wrap and slid down and caused the blister still seems to be slightly open although this is dramatically better than during the last evaluation two weeks ago. I'm very pleased with the way this stands overall. 03/03/19 on evaluation today patient appears to be doing well in regard to his right lower extremity in general although he did have a new blister open this does not appear to be showing any evidence of active infection at this time. Fortunately there's No fevers, chills, nausea, or vomiting noted at this time. Overall I feel like he is making good progress it does feel like that the right leg will  we perform the [employer] seems to do with a bit better than three layer wrap on the left which slid down on him. We may switch to doing bilateral in the book wraps. 5/4; I have not seen [name] in quite some time. According to our case manager he did not  have an open wound on his left leg last week. He had 1 remaining wound on the right posterior medial calf. He arrives today with multiple openings on the left leg probably were blisters and/or wrap injuries from Unna boots. I do not think the Unna boot's will provide adequate compression on the left. I am also not clear about the frequency he is using the compression pumps. 03/17/19 on evaluation today patient appears to be doing excellent in regard to his lower extremities compared to last week's evaluation apparently. He had gotten significantly worse last week which is unfortunate. The [employer] wrap on the left did not seem to do very well for him at all and in fact it didn't control his swelling significantly enough he had an additional outbreak. Subsequently we go back to the four layer compression wrap on the left. This is good news. At least in that he is doing better and the wound seem to be killing him. He still has not heard anything from the lymphedema clinic. 03/24/19 on evaluation today patient actually appears to be doing much better in regard to his bilateral lower Trinity as compared to last week when I saw him. Fortunately there's no signs of active infection at this time. He has been tolerating the dressing changes without complication. Overall I'm extremely pleased with the progress and appearance in general. 04/07/19 on evaluation today patient appears to be doing well in regard to his bilateral lower extremities. His swelling is significantly down from where it was previous. With that being said he does have a couple blisters still open at this point but fortunately nothing that seems to be too severe and again the majority of the larger openings has healed at this time. 04/14/19 on evaluation today patient actually appears to be doing quite well in regard to his bilateral lower extremities in fact I'm not even sure there's anything significantly open at this time at any site.  Nonetheless he did have some trouble with these wraps where they are somewhat irritating him secondary to the fact that he has noted that the graph wasn't too close down to the end of this foot in a little bit short as well up to his knee. Otherwise things seem to be doing quite well. 04/21/19 upon evaluation today patient's wound bed actually showed evidence of being completely healed in regard to both lower extremities which is excellent news. There does not appear to be any signs of active infection which is also good news. I'm very pleased in this regard. No fevers, chills, nausea, or vomiting noted at this time. 04/28/19 on evaluation today patient appears to be doing a little bit worse in regard to both lower extremities on the left mainly due to the fact that when he went infection disease the wrap was not wrapped quite high enough he developed a blister above this. On the right he is a small open area of nothing too significant but again this is continuing to give him some trouble he has been were in the Velcro compression that he has at home. 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 He's  been tolerating the dressing changes without complication. Fortunately there's no signs of active infection at this time. No fevers, chills, nausea, or vomiting noted at this time. We have been trying to get an appointment with her lymphedema clinic in [facility] but unfortunately nobody can get them on phone with not been able to even fax information over the patient likewise is not been able to get in touch with them. Overall I'm not sure exactly what's going on here with to reach out again today. 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 Still having a lot of drainage unfortunately. He tells me especially in the left but even on the right.  There's no signs of active infection which is good news we've been using so ratcheted up to this point. 05/19/19 on evaluation today patient actually appears to be doing quite well with regard to his left lower extremity which is great news. Fortunately in regard to the right lower extremity has an issues with his wrap and he subsequently did remove this from what I'm understanding. Nonetheless long story short is what he had rewrapped once he removed it subsequently had maggots underneath this wrap whenever he came in for evaluation today. With that being said they were obviously completely cleaned away by the nursing staff. The visit today which is excellent news. However he does appear to potentially have some infection around the right ankle region where the maggots were located as well. He will likely require anabiotic therapy today. 05/26/19 on evaluation today patient actually appears to be doing much better in regard to his bilateral lower extremities. I feel like the infection is under much better control. With that being said there were maggots noted when the wrap was removed yet again today. Again this could have potentially been left over from previous although at this time there does not appear to be any signs of significant drainage there was obviously on the wrap some drainage as well this contracted gnats or otherwise. Either way I do not see anything that appears to be doing worse in my pinion and in fact I think his drainage has slowed down quite significantly likely mainly due to the fact to his infection being under better control. 06/02/2019 on evaluation today patient actually appears to be doing well with regard to his bilateral lower extremities there is no signs of active infection at this time which is great news. With that being said he does have several open areas more so on the right than the left but nonetheless these are all significantly better than previously  noted. 06/09/2019 on evaluation today patient actually appears to be doing well. His wrap stayed up and he did not cause any problems he had more drainage on the right compared to the left but overall I do not see any major issues at this time which is great news. 06/16/2019 on evaluation today patient appears to be doing excellent with regard to his lower extremities the only area that is open is a new blister that can have opened as of today on the medial ankle on the left. Other than this he really seems to be doing great I see no major issues at this point. 06/23/2019 on evaluation today patient appears to be doing quite well with regard to his bilateral lower extremities. In fact he actually appears to be almost completely healed there is a small area of weeping noted  of the right lower extremity just above the ankle. Nonetheless fortunately there is no signs of active infection at this time which is good news. No fevers, chills, nausea, vomiting, or diarrhea. 8/24; the patient arrived for a nurse visit today but complained of very significant pain in the left leg and therefore I was asked to look at this. Noted that he did not have an open area on the left leg last week nevertheless this was wrapped. The patient states that he is not been able to put his compression pumps on the left leg because of the discomfort. He has not been systemically unwell 06/30/2019 on evaluation today patient unfortunately despite being excellent last week is doing much worse with regard to his left lower extremity today. In fact he had to come in for a nurse on Monday where his left leg had to be rewrapped due to excessive weeping Dr. Leanord Hawking placed him on doxycycline at that point. Fortunately there is no signs of active infection Systemically at this time which is good news. 07/07/2019 in regard to the patient's wounds today he actually seems to be doing well with his right lower extremity there really is nothing  open or draining at this point this is great news. Unfortunately the left lower extremity is given him additional trouble at this time. There does not appear to be any signs of active infection nonetheless he does have a lot of edema and swelling noted at this point as well as blistering all of which has led to a much more poor appearing leg at this time compared to where it was 2 weeks ago when it was almost completely healed. Obviously this is a little discouraging for the patient. He is try to contact the [place]hed[place] he has not been able to get through to them. 07/14/2019 on evaluation today patient actually appears to be doing slightly better with regard to his left lower extremity ulcers. Overall I do feel like at least at the top of the wrap that we have been placing this area has healed quite nicely and looks much better. The remainder of the leg is showing signs of improvement. Unfortunately in the thigh area he still has an open region on the left and again on the right he has been utilizing just a Band-Aid on an area that also opened on the thigh. Again this is an area that were not able to wrap although we did do an Ace wrap to provide some compression that something that obviously is a little less effective than the compression wraps we have been using on the lower portion of the leg. He does have an appointment with the lymphedema clinic in [facility] on Friday. 07/21/2019 on evaluation today patient appears to be doing better with regard to his lower extremity ulcers. He has been tolerating the dressing changes without complication. Fortunately there is no signs of active infection at this time. No fevers, chills, nausea, vomiting, or diarrhea. I did receive the paperwork from the physical therapist at the lymphedema clinic in [place]. Subsequently I signed off on that this morning and sent that back to him for further progression with the treatment  plan. 07/28/2019 on evaluation today patient appears to be doing very well with regard to his right lower extremity where I do not see any open wounds at this point. Fortunately he is feeling great as far as that is concerned as well. In regard to the left lower extremity he has been having  issues with still several areas of weeping and edema although the upper leg is doing better his lower leg still I think is going require the compression wrap at this time. No fevers, chills, nausea, vomiting, or diarrhea. 08/04/2019 on evaluation today patient unfortunately is having new wounds on the right lower extremity. Again we have been using Unna boot wrap on that side. We switched him to using his juxta lite wrap at home. With that being said he tells me he has been using it although his legs extremely swollen and to be honest really does not appear that he has been. I cannot know that for sure however. Nonetheless he has multiple new wounds on the right lower extremity at this time. Obviously we will have to see about getting this rewrapped for him today. 08/11/2019 on evaluation today patient appears to be doing fairly well with regard to his wounds. He has been tolerating the dressing changes including the compression wraps without complication. He still has a lot of edema in his upper thigh regions bilaterally he is supposed to be seeing the lymphedema clinic on the 15th of this month once his wraps arrive for the upper part of his legs. 08/18/2019 on evaluation today patient appears to be doing well with regard to his bilateral lower extremities at this point. He has been tolerating the dressing changes without complication. Fortunately there is no signs of active infection which is also good news. He does have a couple weeping areas on the first and second toe of the right foot he also has just a small area on the left foot upper leg and a small area on the left lower leg but overall he is  doing quite well in my opinion. He is supposed to be getting his wraps shortly in fact tomorrow and then subsequently is seeing the lymphedema clinic next Wednesday on the 21st. Of note he is also leaving on the 25th to go on vacation for a week to the beach. For that reason and since there is some uncertainty about what there can be doing at lymphedema clinic next Wednesday I am get a make an appointment for next Friday here for [place] to see what we need to do for him prior to him leaving for vacation. 10/23; patient arrives in considerable pain predominantly in the upper posterior calf just distal to the popliteal fossa also in the wound anteriorly above the major wound. This is probably cellulitis and he has had this recurrently in the past. He has no open wound on the right side and he has had an Radio broadcast assistant in that area. Finally I note that he has an area on the left posterior calf which by enlarge is mostly epithelialized. This protrudes beyond the borders of the surrounding skin in the setting of dry scaly skin and lymphedema. The patient is leaving for [facility] on Sunday. Per his longstanding pattern, he will not take his compression pumps with him predominantly out of fear that they will be stolen. He therefore asked that we put a Unna boot back on the right leg. He will also contact the wound care center in [facility] to see if they can change his dressing in the mid week. 11/3; patient returned from his vacation to [facility]. He was seen on 1 occasion at their wound care center. They did a 2 layer compression system as they did not have our 4-layer wrap. I am not completely certain what they put on the wounds.  They did not change the Unna boot on the right. The patient is also seeing a lymphedema specialist physical therapist in [place]. It appears that he has some compression sleeve for his thighs which indeed look quite a bit better than I am used to seeing. He pumps over  these with his external compression pumps. 11/10; the patient has a new wound on the right medial thigh otherwise there is no open areas on the right. He has an area on the left leg posteriorly anteriorly and medially and an area over the left second toe. We have been using silver alginate. He thinks the injury on his thigh is secondary to friction from the compression sleeve he has. 11/17; the patient has a new wound on the right medial thigh last week. He thinks this is because he did not have a underlying stocking for his thigh juxta lite apparatus. He now has this. The area is fairly large and somewhat angry but I do not think he has underlying cellulitis. He has a intact blister on the right anterior tibial area. Small wound on the right great toe dorsally Small area on the medial left calf. Electronic Signature(s) Signed: 09/21/2019 6:07:35 PM By: Baltazar Najjar MD Entered By: Baltazar Najjar on 09/21/2019 10:47:04 -------------------------------------------------------------------------------- Physical Exam Details Patient Name: Date of Service: [name], [name] 09/21/2019 9:30 AM Medical Record [id number] Patient Account Number: [id number] Date of Birth/Sex: Treating RN: 1951/02/11 (68 y.o. [name]) Yevonne Pax Primary Care Provider: Nicoletta [name] Other Clinician: Referring Provider: Treating Provider/Extender:Robson, Lottie Rater, [name] in Treatment: 191 Constitutional Patient is hypertensive.. Pulse regular and within target range for patient.Marland Kitchen Respirations regular, non-labored and within target range.. Temperature is normal and within the target range for the patient.Marland Kitchen Appears in no distress. Eyes Conjunctivae clear. No discharge.no icterus. Respiratory work of breathing is normal. Cardiovascular No obvious CHF. Lymphatic None palpable in the popliteal area. Integumentary (Hair, Skin) Severe stasis dermatitis secondary lymphedema bilaterally.. The patient  has a raised papule in the left posterior thigh. I have been watching this seems unchanged firm. Psychiatric appears at normal baseline. Notes Wound exam Right lower leg open blister on the tibial area. I evacuated this silver alginate Right medial thigh wound with some surrounding erythema but absolutely no tenderness and no evidence of cellulitis. He thinks this is all irritation and I tend to agree with him Small superficial area on the right great toe Area on the left second toe seems healed Small area on the left lateral calf Electronic Signature(s) Signed: 09/21/2019 6:07:35 PM By: Baltazar Najjar MD Entered By: Baltazar Najjar on 09/21/2019 10:49:20 -------------------------------------------------------------------------------- Physician Orders Details Patient Name: Date of Service: [name] 09/21/2019 9:30 AM Medical Record [id number] Patient Account Number: [id number] Date of Birth/Sex: Treating RN: 04/30/1951 (68 y.o. 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 Nurse Visit: - next week Dressing Change Frequency Do not change entire dressing for one week. - both legs Skin  Barriers/Peri-Wound Care TCA Cream or Ointment - mix with lotion  both legs Wound Cleansing May shower with protection. Primary Wound Dressing Wound #140 Left,Posterior Lower Leg Calcium Alginate with Silver Wound #142 Left,Anterior Lower Leg Calcium Alginate with Silver Wound #145 Right,Medial Upper Leg Calcium Alginate with Silver Wound #146 Left Toe Second Calcium Alginate with Silver Secondary Dressing Wound #140 Left,Posterior Lower Leg Dry Gauze ABD pad Wound #142 Left,Anterior Lower Leg Dry Gauze ABD pad Foam Border Wound #145 Right,Medial Upper Leg Foam Border Wound #146 Left Toe Second Kerlix/Rolled Gauze - secure with tape Dry Gauze Edema Control 4 layer compression: Left lower extremity - unna boot at upper portion of lower leg. Unna Boot to Right Lower Extremity Avoid standing for long periods of time Elevate legs to the level of the heart or above for 30 minutes daily and/or when sitting, a frequency of: Exercise regularly Segmental Compressive Device. - lymphadema pumps 60 min 2 times per day Electronic Signature(s) Signed: 09/21/2019 6:07:35 PM By: Baltazar Najjar MD Signed: 10/12/2019 2:50:47 PM By: Yevonne Pax RN Entered By: Yevonne Pax on 09/21/2019 10:43:57 -------------------------------------------------------------------------------- Problem List Details Patient Name: Date of Service: [name] 09/21/2019 9:30 AM Medical Record [id number] Patient Account Number: [id number] Date of Birth/Sex: Treating RN: 09-25-1951 (68 y.o. [name]) Jettie Pagan, [place] Primary Care Provider: Nicoletta [name] Other Clinician: Referring Provider: Treating Provider/Extender:Robson, Lottie Rater, [name] in Treatment: 191 Active Problems ICD-10 Evaluated Encounter Code Description Active Date Today Diagnosis L97.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 right calf limited to 06/30/2018 No Yes breakdown of skin L97.221 Non-pressure chronic ulcer of left calf limited to 09/30/2016 No Yes breakdown of skin I87.333 Chronic venous  hypertension (idiopathic) with ulcer 01/22/2016 No Yes and inflammation of bilateral lower extremity I89.0 Lymphedema, not elsewhere classified 01/22/2016 No Yes E11.622 Type 2 diabetes mellitus with other skin ulcer 01/22/2016 No Yes 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, 01/22/2016 No Yes unspecified L03.116 Cellulitis of left lower limb 04/01/2017 No Yes Inactive Problems ICD-10 Code Description Active Date Inactive Date 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 06/30/2017 skin L97.521 Non-pressure chronic ulcer of other part of left foot limited to 04/27/2018 04/27/2018 breakdown of skin L03.115 Cellulitis of right lower limb 12/22/2017 12/22/2017 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 with other specified 06/30/2018 06/30/2018 severity L97.511 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 06/30/2018 06/30/2018 breakdown of skin Resolved Problems Electronic Signature(s) Signed: 09/21/2019 6:07:35 PM By: Baltazar Najjar MD Entered By: Baltazar Najjar on 09/21/2019 10:45:34 -------------------------------------------------------------------------------- Progress Note Details Patient Name: Date of Service: [name] 09/21/2019 9:30 AM Medical Record [id number] Patient Account Number: [id number] Date of Birth/Sex: Treating RN: 04-29-1951 (68 y.o. [name]) Yevonne Pax Primary Care Provider: Nicoletta [name] Other Clinician: Referring Provider: Treating Provider/Extender:Robson, Lottie Rater, [name] in Treatment: 191 Subjective History of Present Illness (HPI) Referred by PCP for consultation. Patient has long standing history of BLE venous stasis, no prior ulcerations. At beginning of month, developed cellulitis and weeping. Received IM Rocephin followed by Keflex and resolved. Wears compression stocking, appr 6 months old. Not sure strength. No present drainage. 01/22/16 this is a patient  who is a type II diabetic on insulin. 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 He tells me he religiously wears pressure stockings of uncertain strength. He was here with weeping edema about 8 months ago but did not have an open wound. Roughly a month ago he had a reopening on  his bilateral legs. He is been using bandages and Neosporin. He does not complain of pain. 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 Last BUN/creatinine I have is from 11/20/15 at 13 and 1.0 respectively 01/29/16; patient arrives today having tolerated the Profore wrap. He brought in his stockings and these are 18 mmHg stockings he bought from [place]. The compression here is likely inadequate. He does not complain of pain or excessive drainage she has no systemic symptoms. The wound on the right looks improved as does the one on the left although one on the left is more substantial with still tissue at risk below the actual wound area on the bilateral posterior calf 02/05/16; patient arrives with poor edema control. He states that we did put a 4 layer compression on it last week. No weight appear 5 this. 02/12/16; the area on the posterior right Has healed. The left Has a substantial wound that has necrotic surface eschar that requires a debridement with a curette. 02/16/16;the patient called or a Nurse visit secondary to increased swelling. He had been in earlier in the week with his right leg healed. He was transitioned to is on pressure stocking on the right leg with the only open wound on the left, a substantial area on the left posterior calf. Note he has a history of severe lower extremity edema, he has a history of chronic atrial fibrillation but not heart failure per my notes but I'll need to research this. He is not complaining of chest pain shortness of breath or orthopnea. The  intake nurse noted blisters on the previously closed right leg 02/19/16; this is the patient's regular visit day. I see him on Friday with escalating edema new wounds on the right leg and clear signs of at least right ventricular heart failure. I increased his Lasix to 40 twice a day. He is returning currently in follow-up. States he is noticed a decrease in that the edema 02/26/16 patient's legs have much less edema. There is nothing really open on the right leg. The left leg has improved condition of the large superficial wound on the posterior left leg 03/04/16; edema control is very much better. The patient's right leg wounds have healed. On the left leg he continues to have severe venous inflammation on the posterior aspect of the left leg. There is no tenderness and I don't think any of this is cellulitis. 03/11/16; patient's right leg is married healed and he is in his own stocking. The patient's left leg has deteriorated somewhat. There is a lot of erythema around the wound on the posterior left leg. There is also a significant rim of erythema posteriorly just above where the wrap would've ended there is a new wound in this location and a lot of tenderness. Can't rule out cellulitis in this area. 03/15/16; patient's right leg remains healed and he is in his own stocking. The patient's left leg is much better than last review. His major wound on the posterior aspect of his left Is almost fully epithelialized. He has 3 small injuries from the wraps. Really. Erythema seems a lot better on antibiotics 03/18/16; right leg remains healed and he is in his own stocking. The patient's left leg is much better. The area on the posterior aspect of the left calf is fully epithelialized. His 3 small injuries which were wrap injuries on the left are improved  only one seems still open his erythema has resolved 03/25/16; patient's right leg remains healed and he is in his own stocking. There is no open area today  on the left leg posterior leg is completely closed up. His wrap injuries at the superior aspect of his leg are also resolved. He looks as though he has some irritation on the dorsal ankle but this is fully epithelialized without evidence of infection. 03/28/16; we discharged this patient on Monday. Transitioned him into his own stocking. There were problems almost immediately with uncontrolled swelling weeping edema multiple some of which have opened. He does not feel systemically unwell in particular no chest pain no shortness of breath and he does not feel 04/08/16; the edema is under better control with the Profore light wrap but he still has pitting edema. There is one large wound anteriorly 2 on the medial aspect of his left leg and 3 small areas on the superior posterior calf. Drainage is not excessive he is tolerating a Profore light well 04/15/16; put a Profore wrap on him last week. This is controlled is edema however he had a lot of pain on his left anterior foot most of his wounds are healed 04/22/16 once again the patient has denuded areas on the left anterior foot which he states are because his wrap slips up word. He saw his primary physician today is on Lasix 40 twice a day and states that he his weight is down 20 pounds over the last 3 months. 04/29/16: Much improved. left anterior foot much improved. He is now on Lasix 80 mg per day. Much improved edema control 05/06/16; I was hoping to be able to discharge him today however once again he has blisters at a low level of where the compression was placed last week mostly on his left lateral but also his left medial leg and a small area on the anterior part of the left foot. 05/09/16; apparently the patient went home after his appointment on 7/4 later in the evening developing pain in his upper medial thigh together with subjective fever and chills although his temperature was not taken. The pain was so intense he felt he would probably have  to call 911. However he then remembered that he had leftover doxycycline from a previous round of antibiotics and took these. By the next morning he felt a lot better. He called and spoke to one of our nurses and I approved doxycycline over the phone thinking that this was in relation to the wounds we had previously seen although they were definitely were not. The patient feels a lot better old fever no chills he is still working. Blood sugars are reasonably controlled 05/13/16; patient is back in for review of his cellulitis on his anterior medial upper thigh. He is taking doxycycline this is a lot better. Culture I did of the nodular area on the dorsal aspect of his foot grew MRSA this also looks a lot better. 05/20/16; the patient is cellulitis on the medial upper thigh has resolved. All of his wound areas including the left anterior foot, areas on the medial aspect of the left calf and the lateral aspect of the calf at all resolved. He has a new blister on the left dorsal foot at the level of the fourth toe this was excised. No evidence of infection 05/27/16; patient continues to complain weeping edema. He has new blisterlike wounds on the left anterior lateral and posterior lateral calf at the top of his wrap  levels. The area on his left anterior foot appears better. He is not complaining of fever, pain or pruritus in his feet. 05/30/16; the patient's blisters on his left anterior leg posterior calf all look improved. He did not increase the Lasix 100 mg as I suggested because he was going to run out of his 40 mg tablets. He is still having weeping edema of his toes 06/03/16; I renewed his Lasix at 80 mg once a day as he was about to run out when I last saw him. He is on 80 mg of Lasix now. I have asked him to cut down on the excessive amount of water he was drinking and asked him to drink according to his thirst mechanisms 06/12/2016 -- was seen 2 days ago and was supposed to wear his  compression stockings at home but he is developed lymphedema and superficial blisters on the left lower extremity and hence came in for a review 06/24/16; the remaining wound is on his left anterior leg. He still has edema coming from between his toes. There is lymphedema here however his edema is generally better than when I last saw this. He has a history of atrial fibrillation but does not have a known history of congestive heart failure nevertheless I think he probably has this at least on a diastolic basis. 07/01/16 I reviewed his echocardiogram from January 2017. This was essentially normal. He did not have LVH, EF of 55-60%. His right ventricular function was normal although he did have trivial tricuspid and pulmonic regurgitation. This is not audible on exam however. I increased his Lasix to do massive edema in his legs well above his knees I think in early July. He was also drinking an excessive amount of water at the time. 07/15/16; missed his appointment last week because of the Labor Day holiday on Monday. He could not get another appointment later in the week. Started to feel the wrap digging in superiorly so we remove the top half and the bottom half of his wrap. He has extensive erythema and blistering superiorly in the left leg. Very tender. Very swollen. Edema in his foot with leaking edema fluid. He has not been systemically unwell 07/22/16; the area on the left leg laterally required some debridement. The medial wounds look more stable. His wrap injury wounds appear to have healed. Edema and his foot is better, weeping edema is also better. He tells me he is meeting with the supplier of the external compression pumps at work 08/05/16; the patient was on vacation last week in [facility]. His wrap is been on for an extended period of time. Also over the weekend he developed an extensive area of tender erythema across his anterior medial thigh. He took to doxycycline yesterday that he  had leftover from a previous prescription. The patient complains of weeping edema coming out of his toes 08/08/16; I saw this patient on 10/2. He was tender across his anterior thigh. I put him on doxycycline. He returns today in follow-up. He does not have any open wounds on his lower leg, he still has edema weeping into his toes. 08/12/16; patient was seen back urgently today to follow-up for his extensive left thigh cellulitis/erysipelas. He comes back with a lot less swelling and erythema pain is much better. I believe I gave him Augmentin and Cipro. His wrap was cut down as he stated a roll down his legs. He developed blistering above the level of the wrap that remained. He has 2  open blisters and 1 intact. 08/19/16; patient is been doing his primary doctor who is increased his Lasix from 40-80 once a day or 80 already has less edema. Cellulitis has remained improved in the left thigh. 2 open areas on the posterior left calf 08/26/16; he returns today having new open blisters on the anterior part of his left leg. He has his compression pumps but is not yet been shown how to use some vital representative from the supplier. 09/02/16 patient returns today with no open wounds on the left leg. Some maceration in his plantar toes 09/10/2016 -- Dr. Leanord Hawking had recently discharged him on 09/02/2016 and he has come right back with redness swelling and some open ulcers on his left lower extremity. He says this was caused by trying to apply his compression stockings and he's been unable to use this and has not been able to use his lymphedema pumps. He had some doxycycline leftover and he has started on this a few days ago. 09/16/16; there are no open wounds on his leg on the left and no evidence of cellulitis. 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 He does not complain of symptoms here. I am not clear use using his external compression  pumps. 09/23/16; I have not seen this patient in 2 weeks. He canceled his appointment 10 days ago as he was going on vacation. He tells me that on Monday he noticed a large area on his posterior left leg which is been draining copiously and is reopened into a large wound. He is been using ABDs and the external part of his juxtalite, according to our nurse this was not on properly. 10/07/16; Still a substantial area on the posterior left leg. Using silver alginate 10/14/16; in general better although there is still open area which looks healthy. Still using silver alginate. He reminds me that this happen before he left for [facility]. Today while he was showering in the morning. He had been using his juxtalite's 10/21/16; the area on his posterior left leg is fully epithelialized. However he arrives today with a large area of tender erythema in his medial and posterior left thigh just above the knee. I have marked the area. Once again he is reluctant to consider hospitalization. I treated him with oral antibiotics in the past for a similar situation with resolution I think with doxycycline however this area it seems more extensive to me. He is not complaining of fever but does have chills and says states he is thirsty. His blood sugar today was in the 140s at home 10/25/16 the area on his posterior left leg is fully epithelialized although there is still some weeping edema. The large area of tenderness and erythema in his medial and posterior left thigh is a lot less tender although there is still a lot of swelling in this thigh. He states he feels a lot better. He is on doxycycline and Augmentin that I started last week. This will continued until Tuesday, December 26. I have ordered a duplex ultrasound of the left thigh rule out DVT whether there is an abscess something that would need to be drained I would also like to know. 11/01/16; he still has weeping edema from a not fully epithelialized  area on his left posterior calf. Most of the rest of this looks a lot better. He has completed his antibiotics. His thigh is a lot better. Duplex ultrasound did not show a DVT  in the thigh 11/08/16; he comes in today with more Denuded surface epithelium from the posterior aspect of his calf. There is no real evidence of cellulitis. The superior aspect of his wrap appears to have put quite an indentation in his leg just below the knee and this may have contributed. He does not complain of pain or fever. We have been using silver alginate as the primary dressing. The area of cellulitis in the right thigh has totally resolved. He has been using his compression stockings once a week 11/15/16; the patient arrives today with more loss of epithelium from the posterior aspect of his left calf. He now has a fairly substantial wound in this area. The reason behind this deterioration isn't exactly clear although his edema is not well controlled. He states he feels he is generally more swollen systemically. He is not complaining of chest pain shortness of breath fever. Tells me he has an appointment with his primary physician in early February. He is on 80 mg of oral Lasix a day. He claims compliance with the external compression pumps. He is not having any pain in his legs similar to what he has with his recurrent cellulitis 11/22/16; the patient arrives a follow-up of his large area on his left lateral calf. This looks somewhat better today. He came in earlier in the week for a dressing change since I saw him a week ago. He is not complaining of any pain no shortness of breath no chest pain 11/28/16; the patient arrives for follow-up of his large area on the left lateral calf this does not look better. In fact it is larger weeping edema. The surface of the wound does not look too bad. We have been using silver alginate although I'm not certain that this is a dressing issue. 12/05/16; again the patient follows up  for a large wound on the left lateral and left posterior calf this does not look better. There continues to be weeping edema necrotic surface tissue. More worrisome than this once again there is erythema below the wound involving the distal Achilles and heel suggestive of cellulitis. He is on his feet working most of the day of this is not going well. We are changing his dressing twice a week to facilitate the drainage. 12/12/16; not much change in the overall dimensions of the large area on the left posterior calf. This is very inflamed looking. I gave him an. Doxycycline last week does not really seem to have helped. He found the wrap very painful indeed it seems to of dog into his legs superiorly and perhaps around the heel. He came in early today because the drainage had soaked through his dressings. 12/19/16- patient arrives for follow-up evaluation of his left lower extremity ulcers. He states that he is using his lymphedema pumps once daily when there is "no drainage". He admits to not using his lipedema pumps while under current treatment. His blood sugars have been consistently between 150-200. 12/26/16; the patient is not using his compression pumps at home because of the wetness on his feet. I've advised him that I think it's important for him to use this daily. He finds his feet too wet, he can put a plastic bag over his legs while he is in the pumps. Otherwise I think will be in a vicious circle. We are using silver alginate to the major area on his left posterior calf 01/02/17; the patient's posterior left leg has further of all into 3 open wounds. All  of them covered with a necrotic surface. He claims to be using his compression pumps once a day. His edema control is marginal. Continue with silver alginate 01/10/17; the patient's left posterior leg actually looks somewhat better. There is less edema, less erythema. Still has 3 open areas covered with a necrotic surface requiring  debridement. He claims to be using his compression pumps once a day his edema control is better 01/17/17; the patient's left posterior calf look better last week when I saw him and his wrap was changed 2 days ago. He has noted increasing pain in the left heel and arrives today with much larger wounds extensive erythema extending down into the entire heel area especially tender medially. He is not systemically unwell CBGs have been controlled no fever. Our intake nurse showed me limegreen drainage on his AVD pads. 01/24/17; his usual this patient responds nicely to antibiotics last week giving him Levaquin for presumed Pseudomonas. The whole entire posterior part of his leg is much better much less inflamed and in the case of his Achilles heel area much less tender. He has also had some epithelialization posteriorly there are still open areas here and still draining but overall considerably better 01/31/17- He has continue to tolerate the compression wraps. he states that he continues to use the lymphedema pumps daily, and can increase to twice daily on the weekends. He is voicing no complaints or concerns regarding his LLE ulcers 02/07/17-he is here for follow-up evaluation. He states that he noted some erythema to the left medial and anterior thigh, which he states is new as of yesterday. He is concerned about recurrent cellulitis. He states his blood sugars have been slightly elevated, this morning in the 180s 02/14/17; he is here for follow-up evaluation. When he was last here there was erythema superiorly from his posterior wound in his anterior thigh. He was prescribed Levaquin however a culture of the wound surface grew MRSA over the phone I changed him to doxycycline on Monday and things seem to be a lot better. 02/24/17; patient missed his appointment on Friday therefore we changed his nurse visit into a physician visit today. Still using silver alginate on the large area of the posterior left  thigh. He isn't new area on the dorsal left second toe 03/03/17; actually better today although he admits he has not used his external compression pumps in the last 2 days or so because of work responsibilities over the weekend. 03/10/17; continued improvement. External compression pumps once a day almost all of his wounds have closed on the posterior left calf. Better edema control 03/17/17; in general improved. He still has 3 small open areas on the lateral aspect of his left leg however most of the area on the posterior part of his leg is epithelialized. He has better edema control. He has an ABD pad under his stocking on the right anterior lower leg although he did not let us look at that today. 03/24/17; patient arrives back in clinic today with no open areas however there are areas on the posterior left calf and anterior left calf that are less than 100% epithelialized. His edema is well controlled in the left lower leg. There is some pitting edema probably lymphedema in the left upper thigh. He uses compression pumps at home once per day. I tried to get him to do this twice a day although he is very reticent. 04/01/2017 -- for the last 2 days he's had significant redness, tenderness and weeping and came  in for an urgent visit today. 04/07/17; patient still has 6 more days of doxycycline. He was seen by Dr. Meyer Russel last Wednesday for cellulitis involving the posterior aspect, lateral aspect of his Involving his heel. For the most part he is better there is less erythema and less weeping. He has been on his feet for 12 hours o2 over the weekend. Using his compression pumps once a day 04/14/17 arrives today with continued improvement. Only one area on the posterior left calf that is not fully epithelialized. He has intense bilateral venous inflammation associated with his chronic venous insufficiency disease and secondary lymphedema. We have been using silver alginate to the left posterior calf  wound In passing he tells [place] today that the right leg but we have not seen in quite some time has an open area on it but he doesn't want [place] to look at this today states he will show this to [place] next week. 04/21/17; there is no open area on his left leg although he still reports some weeping edema. He showed [place] his right leg today which is the first time we've seen this leg in a long time. He has a large area of open wound on the right leg anteriorly healthy granulation. Quite a bit of swelling in the right leg and some degree of venous inflammation. He told us about the right leg in passing last week but states that deterioration in the right leg really only happened over the weekend 04/28/17; there is no open area on the left leg although there is an irritated part on the posterior which is like a wrap injury. The wound on the right leg which was new from last week at least to [place] is a lot better. 05/05/17; still no open area on the left leg. Patient is using his new compression stocking which seems to be doing a good job of controlling the edema. He states he is using his compression pumps once per day. The right leg still has an open wound although it is better in terms of surface area. Required debridement. A lot of pain in the posterior right Achilles marked tenderness. Usually this type of presentation this patient gives concern for an active cellulitis 05/12/17; patient arrives today with his major wound from last week on the right lateral leg somewhat better. Still requiring debridement. He was using his compression stocking on the left leg however that is reopened with superficial wounds anteriorly he did not have an open wound on this leg previously. He is still using his juxta light's once daily at night. He cannot find the time to do this in the morning as he has to be at work by 7 AM 05/19/17; right lateral leg wound looks improved. No debridement required. The concerning area is on the  left posterior leg which appears to almost have a subcutaneous hemorrhagic component to it. We've been using silver alginate to all the wounds 05/26/17; the right lateral leg wound continues to look improved. However the area on the left posterior calf is a tightly adherent surface. [place] using silver alginate. Because of the weeping edema in his legs there is very little good alternatives. 06/02/17; the patient left here last week looking quite good. Major wound on the left posterior calf and a small one on the right lateral calf. Both of these look satisfactory. He tells me that by Wednesday he had noted increased pain in the left leg and drainage. He called on Thursday and Friday to get  an appointment here but we were blocked. He did not go to urgent care or his primary physician. He thinks he had a fever on Thursday but did not actually take his temperature. He has not been using his compression pumps on the left leg because of pain. I advised him to go to the emergency room today for IV antibiotics for stents of left leg cellulitis but he has refused I have asked him to take 2 days off work to keep his leg elevated and he has refused this as well. In view of this I'm going to call him and Augmentin and doxycycline. 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 However after a detailed discussion regarding the risk benefits and alternatives he refuses to get admitted to the hospital. With no other recourse I will continue him on oral antibiotics as before and hopefully he'll have his infectious disease consultation this week. 06/16/2017 -- the patient was seen today by the nurse practitioner at infectious disease Ms. Dixon. Her review noted recurrent  cellulitis of the lower extremity with tinea pedis of the left foot and she has recommended clindamycin 150 mg daily for now and she may increase it to 300 mg daily to cover staph and Streptococcus. He has also been advise Lotrimin cream locally. she also had wise IV antibiotics for his condition if it flares up 06/23/17; patient arrives today with drainage bilaterally although the remaining wound on the left posterior calf after cleaning up today "highlighter yellow drainage" did not look too bad. Unfortunately he has had breakdown on the right anterior leg [previously this leg had not been open and he is using a black stocking] he went to see infectious disease and is been put on clindamycin 150 mg daily, I did not verify the dose although I'm not familiar with using clindamycin in this dosing range, perhaps for prophylaxisoo 06/27/17; I brought this patient back today to follow-up on the wound deterioration on the right lower leg together with surrounding cellulitis. I started him on doxycycline 4 days ago. This area looks better however he comes in today with intense cellulitis on the medial part of his left thigh. This is not have a wound in this area. Extremely tender. We've been using silver alginate to the wounds on the right lower leg left lower leg with bilateral 4 layer compression he is using his external compression pumps once a day 07/04/17; patient's left medial thigh cellulitis looks better. He has not been using his compression pumps as his insert said it was contraindicated with cellulitis. His right leg continues to make improvements all the wounds are still open. We only have one remaining wound on the left posterior calf. Using silver alginate to all open areas. He is on doxycycline which I started a week ago and should be finishing I gave him Augmentin after Thursday's visit for the severe cellulitis on the left medial thigh which fortunately looks better 07/14/17; the patient's  left medial thigh cellulitis has resolved. The cellulitis in his right lower calf on the right also looks better. All of his wounds are stable to improved we've been using silver alginate he has completed the antibiotics I have given him. He has clindamycin 150 mg once a day prescribed by  infectious disease for prophylaxis, I've advised him to start this now. We have been using bilateral Unna boots over silver alginate to the wound areas 07/21/17; the patient is been to see infectious disease who noted his recurrent problems with cellulitis. He was not able to tolerate prophylactic clindamycin therefore he is on amoxicillin 500 twice a day. He also had a second daily dose of Lasix added By Dr. Oneta Rack but he is not taking this. Nor is he being completely compliant with his compression pumps a especially not this week. He has 2 remaining wounds one on the right posterior lateral lower leg and one on the left posterior medial lower leg. 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 The patient has Unna boots bilaterally. 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 He has bilateral Unna boots for compression and silver alginate to his wounds. Arrives today with his legs looking as good as I have seen him in quite some time. Not surprisingly his wounds look better as well with improvement on the right lateral leg venous insufficiency wound and also the left medial leg. He is still using the compression pumps once a day 08/11/17; both legs appear to be doing better wounds on the right lateral and left medial legs look better. Skin on the right leg quite good. He is been using silver alginate as the primary dressing. I'm going to use Anasept gel calcium alginate and maintain all the  secondary dressings 08/18/17; the patient continues to actually do quite well. The area on his right lateral leg is just about closed the left medial also looks better although it is still moist in this area. His edema is well controlled we have been using Anasept gel with calcium alginate and the usual secondary dressings, 4 layer compression and once daily use of his compression pumps "always been able to manage 09/01/17; the patient continues to do reasonably well in spite of his trip to [place]. The area on the right lateral leg is epithelialized. Left is much better but still open. He has more edema and more chronic erythema on the left leg [venous inflammation] 09/08/17; he arrives today with no open wound on the right lateral leg and decently controlled edema. Unfortunately his left leg is not nearly as in his good situation as last week.he apparently had increasing edema starting on Saturday. He edema soaked through into his foot so used a plastic bag to walk around his home. The area on the medial right leg which was his open area is about the same however he has lost surface epithelium on the left lateral which is new and he has significant pain in the Achilles area of the left foot. He is already on amoxicillin chronically for prophylaxis of cellulitis in the left leg 09/15/17; he is completed a week of doxycycline and the cellulitis in the left posterior leg and Achilles area is as usual improved. He still has a lot of edema and fluid soaking through his dressings. There is no open wound on the right leg. He saw infectious disease NP today 09/22/17;As usual 1 we transition him from our compression wraps to his stockings things did not go well. He has several small open areas on the right leg. He states this was caused by the compression wrap  on his skin although he did not wear this with the stockings over them. He has several superficial areas on the left leg medially  laterally posteriorly. He does not have any evidence of active cellulitis especially involving the left Achilles The patient is traveling from [facility] Saturday going to [facility]. He states he isn't attempting to get an appointment with a heel objects wound center there to change his dressings. I am not completely certain whether this will work 10/06/17; the patient came in on Friday for a nurse visit and the nurse reported that his legs actually look quite good. He arrives in clinic today for his regular follow-up visit. He has a new wound on his left third toe over the PIP probably caused by friction with his footwear. He has small areas on the left leg and a very superficial but epithelialized area on the right anterior lateral lower leg. Other than that his legs look as good as I've seen him in quite some time. We have been using silver alginate Review of systems; no chest pain no shortness of breath other than this a 10 point review of systems negative 10/20/17; seen by Dr. Meyer Russel last week. He had taken some antibiotics [doxycycline] that he had left over. Dr. Meyer Russel thought he had candida infection and declined to give him further antibiotics. He has a small wound remaining on the right lateral leg several areas on the left leg including a larger area on the left posterior several left medial and anterior and a small wound on the left lateral. The area on the left dorsal third toe looks a lot better. ROS; Gen.; no fever, respiratory no cough no sputum Cardiac no chest pain other than this 10 point review of system is negative 10/30/17; patient arrives today having fallen in the bathtub 3 days ago. It took him a while to get up. He has pain and maceration in the wounds on his left leg which have deteriorated. He has not been using his pumps he also has some maceration on the right lateral leg. 11/03/17; patient continues to have weeping edema especially in the left leg. This saturates  his dressings which were just put on on 12/27. As usual the doxycycline seems to take care of the cellulitis on his lower leg. He is not complaining of fever, chills, or other systemic symptoms. He states his leg feels a lot better on the doxycycline I gave him empirically. He also apparently gets injections at his primary doctor's officeo Rocephin for cellulitis prophylaxis. I didn't ask him about his compression pump compliance today I think that's probably marginal. Arrives in the clinic with all of his dressings primary and secondary macerated full of fluid and he has bilateral edema 11/10/17; the patient's right leg looks some better although there is still a cluster of wounds on the right lateral. The left leg is inflamed with almost circumferential skin loss medially to laterally although we are still maintaining anteriorly. He does not have overt cellulitis there is a lot of drainage. He is not using compression pumps. We have been using silver alginate to the wound areas, there are not a lot of options here 11/17/17; the patient's right leg continues to be stable although there is still open wounds, better than last week. The inflammation in the left leg is better. Still loss of surface layer epithelium especially posteriorly. There is no overt cellulitis in the amount of edema and his left leg is really quite good, tells me he  is using his compression pumps once a day. 11/24/17; patient's right leg has a small superficial wound laterally this continues to improve. The inflammation in the left leg is still improving however we have continuous surface layer epithelial loss posteriorly. There is no overt cellulitis in the amount of edema in both legs is really quite good. He states he is using his compression pumps on the left leg once a day for 5 out of 7 days 12/01/17; very small superficial areas on the right lateral leg continue to improve. Edema control in both legs is better today. He has  continued loss of surface epithelialization and left posterior calf although I think this is better. We have been using silver alginate with large number of absorptive secondary dressings 4 layer on the left Unna boot on the right at his request. He tells me he is using his compression pumps once a day 12/08/17; he has no open area on the right leg is edema control is good here. ooOn the left leg however he has marked erythema and tenderness breakdown of skin. He has what appears to be a wrap injury just distal to the popliteal fossa. 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 There was swelling erythema and breakdown in the right leg. He has much worse skin breakdown in the left leg as well multiple open areas medially and posteriorly as well as laterally. He tells me he has been using his compression pumps but tells me he feels that the drainage out of his leg is worse when he uses a compression pumps. To be fair to him he is been saying this for a while however I don't know that I have really been listening to this. I wonder if the compression pumps are working properly 12/22/17;. Once again he arrives with severe erythema, weeping edema from the left greater than right leg. Noncompliance with compression pumps. New this visit he is complaining of pain on the lateral aspect of the right leg and the medial aspect of his right thigh. He apparently saw his cardiologist Dr. Debara Pickett who was ordered an echocardiogram area and I think this is a step in the right direction 12/25/17; started his doxycycline Monday night. There is still intense erythema of the right leg especially in the anterior thigh although there is less  tenderness. The erythema around the wound on the right lateral calf also is less tender. He still complaining of pain in the left heel. His wounds are about the same right lateral left medial left lateral. Superficial but certainly not close to closure. He denies being systemically unwell no fever chills no abdominal pain no diarrhea 12/29/17; back in follow-up of his extensive right calf and right thigh cellulitis. I added amoxicillin to cover possible doxycycline resistant strep. This seems to of done the trick he is in much less pain there is much less erythema and swelling. He has his echocardiogram at 11:00 this morning. X-ray of the left heel was also negative. 01/05/18; the patient arrived with his edema under much better control. Now that he is retired he is able to use his compression pumps daily and sometimes twice a day per the patient. He has a wound on the right leg the  lateral wound looks better. Area on the left leg also looks a lot better. He has no evidence of cellulitis in his bilateral thighs I had a quick peak at his echocardiogram. He is in normal ejection fraction and normal left ventricular function. He has moderate pulmonary hypertension moderately reduced right ventricular function. One would have to wonder about chronic sleep apnea although he says he doesn't snore. He'll review the echocardiogram with his cardiologist. 01/12/18; the patient arrives with the edema in both legs under exemplary control. He is using his compression pumps daily and sometimes twice daily. His wound on the right lateral leg is just about closed. He still has some weeping areas on the posterior left calf and lateral left calf although everything is just about closed here as well. I have spoken with Aldean Baker who is the patient's nurse practitioner and infectious disease. She was concerned that the patient had not understood that the parenteral penicillin injections he was receiving for  cellulitis prophylaxis was actually benefiting him. I don't think the patient actually saw that I would tend to agree we were certainly dealing with less infections although he had a serious one last month. 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 He is healed. He'll be placed in juxtalite compression wraps and increase his lymphedema pumps to twice daily. We will follow up again next week to ensure there are no issues with the new regiment. 01/20/18-he is here for evaluation of bilateral lower extremity weeping edema. Yesterday he was placed in compression wrap to the right lower extremity and compression stocking to left lower shrubbery. He states he uses lymphedema pumps last night and again this morning and noted a blister to the left lower extremity. On exam he was noted to have drainage to the right lower extremity. He will be placed in Unna boots bilaterally and follow-up next week 01/26/18; patient was actually discharged a week ago to his own juxta light stockings only to return the next day with bilateral lower extremity weeping edema.he was placed in bilateral Unna boots. He arrives today with pain in the back of his left leg. There is no open area on the right leg however there is a linear/wrap injury on the left leg and weeping edema on the left leg posteriorly. I spoke with infectious disease about 10 days ago. They were disappointed that the patient elected to discontinue prophylactic intramuscular penicillin shots as they felt it was particularly beneficial in reducing the frequency of his cellulitis. I discussed this with the patient today. He does not share this view. He'll definitely need antibiotics today. Finally he is traveling to [place] and trauma leaving this Saturday and returning a week later and he does not travel with his pumps. He is going by car 01/30/18; patient was seen 4 days ago and brought back in today for review of cellulitis  in the left leg posteriorly. I put him on amoxicillin this really hasn't helped as much as I might like. He is also worried because he is traveling to [facility] trauma by car. Finally we will be rewrapping him. There is no open area on the right leg over his left leg has multiple weeping areas as usual 02/09/18; The same wrap on for 10 days. He did not pick up the last doxycycline I prescribed for him. He apparently took 4 days worth he already had. There is nothing open on his right leg and the edema control is really quite good. He's  had damage in the left leg medially and laterally especially probably related to the prolonged use of Unna boots 02/12/18; the patient arrived in clinic today for a nurse visit/wrap change. He complained of a lot of pain in the left posterior calf. He is taking doxycycline that I previously prescribed for him. Unfortunately even though he used his stockings and apparently used to compression pumps twice a day he has weeping edema coming out of the lateral part of his right leg. This is coming from the lower anterior lateral skin area. 02/16/18; the patient has finished his doxycycline and will finish the amoxicillin 2 days. The area of cellulitis in the left calf posteriorly has resolved. He is no longer having any pain. He tells me he is using his compression pumps at least once a day sometimes twice. 02/23/18; the patient finished his doxycycline and Amoxil last week. On Friday he noticed a small erythematous circle about the size of a quarter on the left lower leg just above his ankle. This rapidly expanded and he now has erythema on the lateral and posterior part of the thigh. This is bright red. Also has an area on the dorsal foot just above his toes and a tender area just below the left popliteal fossa. He came off his prophylactic penicillin injections at his own insistence one or 2 months ago. This is obviously deteriorated since then 03/02/18; patient is on  doxycycline and Amoxil. Culture I did last week of the weeping area on the back of his left calf grew group B strep. I have therefore renewed the amoxicillin 500 3 times a day for a further week. He has not been systemically unwell. Still complaining of an area of discomfort right under his left popliteal fossa. There is no open wound on the right leg. He tells me that he is using his pumps twice a day on most days 03/09/18; patient arrives in clinic today completing his amoxicillin today. The cellulitis on his left leg is better. Furthermore he tells me that he had intramuscular penicillin shots that his primary care office today. However he also states that the wrap on his right leg fell down shortly after leaving clinic last week. He developed a large blister that was present when he came in for a nurse visit later in the week and then he developed intense discomfort around this area.He tells me he is using his compression pumps 03/16/18; the patient has completed his doxycycline. The infectious part of this/cellulitis in the left heel area left popliteal area is a lot better. He has 2 open areas on the right calf. Still areas on the left calf but this is a lot better as well. 03/24/18; the patient arrives complaining of pain in the left popliteal area again. He thinks some of this is wrap injury. He has no open area on the right leg and really no open area on the left calf either except for the popliteal area. He claims to be compliant with the compression pumps 03/31/18; I gave him doxycycline last week because of cellulitis in the left popliteal area. This is a lot better although the surface epithelium is denuded off and response to this. He arrives today with uncontrolled edema in the right calf area as well as a fingernail injury in the right lateral calf. There is only a few open areas on the left 04/06/18; I gave him amoxicillin doxycycline over the last 2 weeks that the amoxicillin should be  completing currently. He is  not complaining of any pain or systemic symptoms. The only open areas see has is on the right lateral lower leg paradoxically I cannot see anything on the left lower leg. He tells me he is using his compression pumps twice a day on most days. Silver alginate to the wounds that are open under 4 layer compression 04/13/18; he completed antibiotics and has no new complaints. Using his compression pumps. Silver alginate that anything that's opened 04/20/18; he is using his compression pumps religiously. Silver alginate 4 layer compression anything that's opened. He comes in today with no open wounds on the left leg but 3 on the right including a new one posteriorly. He has 2 on the right lateral and one on the right posterior. He likes Unna boots on the right leg for reasons that aren't really clear we had the usual 4 layer compression on the left. It may be necessary to move to the 4 layer compression on the right however for now I left them in the Unna boots 04/27/18; he is using his compression pumps at least once a day. He has still the wounds on the right lateral calf. The area right posteriorly has closed. He does not have an open wound on the left under 4 layer compression however on the dorsal left foot just proximal to the toes and the left third toe 2 small open areas were identified 05/11/18; he has not uses compression pumps. The areas on the right lateral calf have coalesced into one large wound necrotic surface. On the left side he has one small wound anteriorly however the edema is now weeping out of a large part of his left leg. He says he wasn't using his pumps because of the weeping fluid. I explained to him that this is the time he needs to pump more 05/18/18; patient states he is using his compression pumps twice a day. The area on the right lateral large wound albeit superficial. On the left side he has innumerable number of small new wounds on the left calf  particularly laterally but several anteriorly and medially. All these appear to have healthy granulated base these look like the remnants of blisters however they occurred under compression. The patient arrives in clinic today with his legs somewhat better. There is certainly less edema, less multiple open areas on the left calf and the right anterior leg looks somewhat better as well superficial and a little smaller. However he relates pain and erythema over the last 3-4 days in the thigh and I looked at this today. He has not been systemically unwell no fever no chills no change in blood sugar values 05/25/18; comes in today in a better state. The severe cellulitis on his left leg seems better with the Keflex. Not as tender. He has not been systemically unwell ooHard to find an open wound on the left lower leg using his compression pumps twice a day ooThe confluent wounds on his right lateral calf somewhat better looking. These will ultimately need debridement I didn't do this today. 06/01/18; the severe cellulitis on the left anterior thigh has resolved and he is completed his Keflex. ooThere is no open wound on the left leg however there is a superficial excoriation at the base of the third toe dorsally. Skin on the bottom of his left foot is macerated looking. ooThe left the wounds on the lateral right leg actually looks some better although he did require debridement of the top half of this wound area with  an open curet 06/09/18 on evaluation today patient appears to be doing poorly in regard to his right lower extremity in particular this appears to likely be infected he has very thick purulent discharge along with a bright green tent to the discharge. This makes me concerned about the possibility of pseudomonas. He's also having increased discomfort at this point on evaluation. Fortunately there does not appear to be any evidence of infection spreading to the other location at this  time. 06/16/18 on evaluation today patient appears to actually be doing fairly well. His ulcer has actually diminished in size quite significantly at this point which is good news. Nonetheless he still does have some evidence of infection he did see infectious disease this morning before coming here for his appointment. I did review the results of their evaluation and their note today. They did actually have him discontinue the Cipro and initiate treatment with linezolid at this time. He is doing this for the next seven days and they recommended a follow-up in four months with them. He is the keep a log of the need for intermittent antibiotic therapy between now and when he falls back up with infectious disease. This will help them gaze what exactly they need to do to try and help them out. 06/23/18; the patient arrives today with no open wounds on the left leg and left third toe healed. He is been using his compression pumps twice a day. On the right lateral leg he still has a sizable wound but this is a lot better than last time I saw this. 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 Has been using silver alginate under 4 layer compression 06/30/18; the only open wound he has is on the right lateral leg and this looks healthy. No debridement is required. We have been using silver alginate. He does not have an open wound on the left leg. There is apparently some drainage from the dorsal proximal third toe on the left although I see no open wound here. 07/03/18 on evaluation today patient was actually here just for a nurse visit rapid change. However when he was here on Wednesday for his rat change due to having been healed on the left and then developing blisters we initiated the wrap again knowing that he would be back today for [place] to reevaluate and see were at. Unfortunately he has developed some  cellulitis into the proximal portion of his right lower extremity even into the region of his thigh. 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 He was placed on one as what at that point. Nonetheless he is done with that and has been tolerating it well otherwise. Doxycycline which in the past really did not seem to be effective for him. Nonetheless I think the best option may be for [place] to definitely reinitiate the antibiotics for a longer period of time. 07/07/18; since I last saw this patient a week ago he has had a difficult time. At that point he did not have an open wound on his left leg. We transitioned him into juxta light stockings. He was apparently in the clinic the next day with blisters on the left lateral and left medial lower calf. He also had weeping edema fluid. He was put back into a compression wrap. He was also in the clinic on Friday with intense  erythema in his right thigh. Per the patient he was started on Bactrim however that didn't work at all in terms of relieving his pain and swelling. He has taken 3 doxycycline that he had left over from last time and that seems to of helped. He has blistering on the right thigh as well. 07/14/18; the erythema on his right thigh has gotten better with doxycycline that he is finishing. 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 Presumably this cellulitis in the thigh was not related to that although I think this is a bit concerning going forward. He still has an area on the right lateral calf the blister on the right medial calf just below the knee that was discussed above. On the left 2 small open areas left medial and left lateral. Edema control is adequate. He is using his compression pumps twice a day 07/20/18; continued improvement in the condition of both legs especially the edema in his bilateral thighs. He tells me he  is been losing weight through a combination of diet and exercise. He is using his compression pumps twice a day. So overall she made to the remaining wounds 07/27/2018; continued improvement in condition of both legs. His edema is well controlled. The area on the right lateral leg is just about closed he had one blisters show up on the medial left upper calf. We have him in 4 layer compression. He is going on a 10-day trip to [place], [place] and [place]. He will be driving. He wants to wear Unna boots because of the lessening amount of constriction. He will not use compression pumps while he is away 08/05/18 on evaluation today patient actually appears to be doing decently well all things considered in regard to his bilateral lower extremities. The worst ulcer is actually only posterior aspect of his left lower extremity with a four layer compression wrap cut into his leg a couple weeks back. He did have a trip and actually had [employer] for the trip that he is worn since he was last here. Nonetheless he feels like the [employer] actually do better for him his swelling is up a little bit but he also with his trip was not taking his Lasix on a regular set schedule like he was supposed to be. He states that obviously the reason being that he cannot drive and keep going without having to urinate too frequently which makes it difficult. He did not have his pumps with him while he was away either which I think also maybe playing a role here too. 08/13/2018; the patient only has a small open wound on the right lateral calf which is a big improvement in the last month or 2. He also has the area posteriorly just below the posterior fossa on the left which I think was a wrap injury from several weeks ago. He has no current evidence of cellulitis. He tells me he is back into his compression pumps twice a day. He also tells me that while he was at the laundromat somebody stole a section of  his extremitease stockings 08/20/2018; back in the clinic with a much improved state. He only has small areas on the right lateral mid calf which is just about healed. This was is more substantial area for quite a prolonged period of time. He has a small open area on the left anterior tibia. The area on  the posterior calf just below the popliteal fossa is closed today. He is using his compression pumps twice a day 08/28/2018; patient has no open wound on the right leg. He has a smattering of open areas on the calf with some weeping lymphedema. More problematically than that it looks as though his wraps of slipped down in his usual he has very angry upper area of edema just below the right medial knee and on the right lateral calf. He has no open area on his feet. The patient is traveling to [facility] next week. I will send him in an antibiotic. We will continue to wrap the right leg. We ordered extremitease stockings for him last week and I plan to transition the right leg to a stocking when he gets home which will be in 10 days time. As usual he is very reluctant to take his pumps with him when he travels 09/07/2018; patient returns from [facility]. He shows me a picture of his left leg in the mid part of his trip last week with intense fire engine erythema. The picture look bad enough I would have considered sending him to the hospital. Instead he went to the wound care center in [facility]. They did not prescribe him antibiotics but he did take some doxycycline he had leftover from a previous visit. I had given him trimethoprim sulfamethoxazole before he left this did not work according to the patient. This is resulted in some improvement fortunately. He comes back with a large wound on the left posterior calf. Smaller area on the left anterior tibia. Denuded blisters on the dorsal left foot over his toes. Does not have much in the way of wounds on the right leg although he  does have a very tender area on the right posterior area just below the popliteal fossa also suggestive of infection. He promises me he is back on his pumps twice a day 09/15/2018; the intense cellulitis in his left lower calf is a lot better. The wound area on the posterior left calf is also so better. However he has reasonably extensive wounds on the dorsal aspect of his second and third toes and the proximal foot just at the base of the toes. There is nothing open on the right leg 09/22/2018; the patient has excellent edema control in his legs bilaterally. He is using his external compression pumps twice a day. He has no open area on the right leg and only the areas in the left foot dorsally second and third toe area on the left side. He does not have any signs of active cellulitis. 10/06/2018; the patient has good edema control bilaterally. He has no open wound on the right leg. There is a blister in the posterior aspect of his left calf that we had to deal with today. He is using his compression pumps twice a day. There is no signs of active cellulitis. We have been using silver alginate to the wound areas. He still has vulnerable areas on the base of his left first second toes dorsally He has a his extremities stockings and we are going to transition him today into the stocking on the right leg. He is cautioned that he will need to continue to use the compression pumps twice a day. If he notices uncontrolled edema in the right leg he may need to go to 3 times a day. 10/13/2018; the patient came in for a nurse check on Friday he has a large flaccid blister  on the right medial calf just below the knee. We unroofed this. He has this and a new area underneath the posterior mid calf which was undoubtedly a blister as well. He also has several small areas on the right which is the area we put his extremities stocking on. 10/19/2018; the patient went to see infectious disease this morning I am not  sure if that was a routine follow-up in any case the doxycycline I had given him was discontinued and started on linezolid. He has not started this. It is easy to look at his left calf and the inflammation and think this is cellulitis however he is very tender in the tissue just below the popliteal fossa and I have no doubt that there is infection going on here. He states the problem he is having is that with the compression pumps the edema goes down and then starts walking the wrap falls down. We will see if we can adhere this. He has 1 or 2 minuscule open areas on the right still areas that are weeping on the posterior left calf, the base of his left second and third toes 10/26/18; back today in clinic with quite of skin breakdown in his left anterior leg. This may have been infection the area below the popliteal fossa seems a lot better however tremendous epithelial loss on the left anterior mid tibia area over quite inexpensive tissue. He has 2 blisters on the right side but no other open wound here. 10/29/2018; came in urgently to see [place] today and we worked him in for review. He states that the 4 layer compression on the right leg caused pain he had to cut it down to roughly his mid calf this caused swelling above the wrap and he has blisters and skin breakdown today. As a result of the pain he has not been using his pumps. Both legs are a lot more edematous and there is a lot of weeping fluid. 11/02/18; arrives in clinic with continued difficulties in the right leg> left. Leg is swollen and painful. multiple skin blisters and new open areas especially laterally. He has not been using his pumps on the right leg. 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 He is not systemically unwell. 11/09/2018; the patient claims he is being compliant with his pumps. He is finished the doxycycline I gave him last week. Culture I did of the wound on the right  lateral leg showed a few very resistant methicillin staph aureus. This was resistant to doxycycline. Nevertheless he states the pain in the leg is a lot better which makes me wonder if the cultured organism was not really what was causing the problem nevertheless this is a very dangerous organism to be culturing out of any wound. His right leg is still a lot larger than the left. He is using an Radio broadcast assistant on this area, he blames a 4-layer compression for causing the original skin breakdown which I doubt is true however I cannot talk him out of it. We have been using silver alginate to all of these areas which were initially blisters 11/16/2018; patient is being compliant with his external compression pumps at twice a day. Miraculously he arrives in clinic today with absolutely no open wounds. He has better edema control on the left where he has been using 4 layer compression versus wound of wounds on the right and I pointed this out to him. There is no inflammation in the  skin in his lower legs which is also somewhat unusual for him. There is no open wounds on the dorsal left foot. He has extremitease stockings at home and I have asked him to bring these in next week. 11/25/18 patient's lower extremity on examination today on the left appears for the most part to be wound free. He does have an open wound on the lateral aspect of the right lower extremity but this is minimal compared to what I've seen in past. He does request that we go ahead and wrap the left leg as well even though there's nothing open just so hopefully it will not reopen in short order. 1/28; patient has superficial open wounds on the right lateral calf left anterior calf and left posterior calf. His edema control is adequate. 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 We have been using silver alginate as the primary dressing. He  claims compliance with his compression pumps 2/4; patient has superficial open wounds on numerous areas of his left calf and again one on the left dorsal foot. The areas on the right lateral calf have healed. The cellulitis that I gave him doxycycline for last week is also resolved this was mostly on the left anterior calf just below the tibial tuberosity. His edema looks fairly well-controlled. He tells me he went to see his primary doctor today and had blood work ordered 2/11; once again he has several open areas on the left calf left tibial area. Most of these are small and appear to have healthy granulation. He does not have anything open on the right. The edema and control in his thighs is pretty good which is usually a good indication he has been using his pumps as requested. 2/18; he continues to have several small areas on the left calf and left tibial area. Most of these are small healthy granulation. We put him in his stocking on the right leg last week and he arrives with a superficial open area over the right upper tibia and a fairly large area on the right lateral tibia in similar condition. His edema control actually does not look too bad, he claims to be using his compression pumps twice a day 2/25. Continued small areas on the left calf and left tibial area. New areas especially on the right are identified just below the tibial tuberosity and on the right upper tibia itself. There are also areas of weeping edema fluid even without an obvious wound. He does not have a considerable degree of lymphedema but clearly there is more edema here than his skin can handle. He states he is using the pumps twice a day. We have an Unna boot on the right and 4 layer compression on the left. 3/3; he continues to have an area on the right lateral calf and right posterior calf just below the popliteal fossa. There is a fair amount of tenderness around the wound on the popliteal fossa but I did not  see any evidence of cellulitis, could just be that the wrap came down and rubbed in this area. ooHe does not have an open area on the left leg however there is an area on the left dorsal foot at the base of the third toe ooWe have been using silver alginate to all wound areas 3/10; he did not have an open area on his left leg last time he was here a week ago.  Today he arrives with a horizontal wound just below the tibial tuberosity and an area on the left lateral calf. He has intense erythema and tenderness in this area. The area is on the right lateral calf and right posterior calf better than last week. We have been using silver alginate as usual 3/18 - Patient returns with 3 small open areas on left calf, and 1 small open area on right calf, the skin looks ok with no significant erythema, he continues the UNA boot on right and 4 layer compression on left. The right lateral calf wound is closed , the right posterior is small area. we will continue silver alginate to the areas. 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 He is been tolerating the dressing changes without complication. Fortunately she has made excellent improvement in regard to the overall status of his wounds. Unfortunately every time we cease wrapping him he ends up reopening in causing more significant issues at that point. Again I'm unsure of the best direction to take although I think the lymphedema clinic may be appropriate for him. 02/03/19 on evaluation today patient appears to be doing well in regard to the wounds that we saw him for last week unfortunately he has a new area on the proximal portion of his right medial/posterior lower extremity where the wrap somewhat slowed down and caused swelling and a blister to rub and  open. Unfortunately this is the only opening that he has on either leg at this point. 02/17/19 on evaluation today patient's bilateral lower extremities appear to be doing well. He still completely healed in regard to the left lower extremity. In regard to the right lower extremity the area where the wrap and slid down and caused the blister still seems to be slightly open although this is dramatically better than during the last evaluation two weeks ago. I'm very pleased with the way this stands overall. 03/03/19 on evaluation today patient appears to be doing well in regard to his right lower extremity in general although he did have a new blister open this does not appear to be showing any evidence of active infection at this time. Fortunately there's No fevers, chills, nausea, or vomiting noted at this time. Overall I feel like he is making good progress it does feel like that the right leg will we perform the [employer] seems to do with a bit better than three layer wrap on the left which slid down on him. We may switch to doing bilateral in the book wraps. 5/4; I have not seen [name] in quite some time. According to our case manager he did not have an open wound on his left leg last week. He had 1 remaining wound on the right posterior medial calf. He arrives today with multiple openings on the left leg probably were blisters and/or wrap injuries from Unna boots. I do not think the Unna boot's will provide adequate compression on the left. I am also not clear about the frequency he is using the compression pumps. 03/17/19 on evaluation today patient appears to be doing excellent in regard to his lower extremities compared to last week's evaluation apparently. He had gotten significantly worse last week which is unfortunate. The [employer] wrap on the left did not seem to do very well for him  at all and in fact it didn't control his swelling significantly enough he had an additional outbreak.  Subsequently we go back to the four layer compression wrap on the left. This is good news. At least in that he is doing better and the wound seem to be killing him. He still has not heard anything from the lymphedema clinic. 03/24/19 on evaluation today patient actually appears to be doing much better in regard to his bilateral lower Trinity as compared to last week when I saw him. Fortunately there's no signs of active infection at this time. He has been tolerating the dressing changes without complication. Overall I'm extremely pleased with the progress and appearance in general. 04/07/19 on evaluation today patient appears to be doing well in regard to his bilateral lower extremities. His swelling is significantly down from where it was previous. With that being said he does have a couple blisters still open at this point but fortunately nothing that seems to be too severe and again the majority of the larger openings has healed at this time. 04/14/19 on evaluation today patient actually appears to be doing quite well in regard to his bilateral lower extremities in fact I'm not even sure there's anything significantly open at this time at any site. Nonetheless he did have some trouble with these wraps where they are somewhat irritating him secondary to the fact that he has noted that the graph wasn't too close down to the end of this foot in a little bit short as well up to his knee. Otherwise things seem to be doing quite well. 04/21/19 upon evaluation today patient's wound bed actually showed evidence of being completely healed in regard to both lower extremities which is excellent news. There does not appear to be any signs of active infection which is also good news. I'm very pleased in this regard. No fevers, chills, nausea, or vomiting noted at this time. 04/28/19 on evaluation today patient appears to be doing a little bit worse in regard to both lower extremities on the left mainly due to  the fact that when he went infection disease the wrap was not wrapped quite high enough he developed a blister above this. On the right he is a small open area of nothing too significant but again this is continuing to give him some trouble he has been were in the Velcro compression that he has at home. 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 He's been tolerating the dressing changes without complication. Fortunately there's no signs of active infection at this time. No fevers, chills, nausea, or vomiting noted at this time. We have been trying to get an appointment [place] [facility] [place] [facility]  but unfortunately nobody can get them on phone with not been able to even fax information over the patient likewise is not been able to get in touch with them. Overall I'm not sure exactly what's going on here with to reach out again today. 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 Still having a lot of drainage unfortunately. He tells me especially in the left but even on the right. There's no signs of active infection which is good news we've been using so ratcheted up to this point. 05/19/19 on evaluation today patient actually appears to be doing quite well with regard to his left lower extremity which is great  news. Fortunately in regard to the right lower extremity has an issues with his wrap and he subsequently did remove this from what I'm understanding. Nonetheless long story short is what he had rewrapped once he removed it subsequently had maggots underneath this wrap whenever he came in for evaluation today. With that being said they were obviously completely cleaned away by the nursing staff. The visit today which is excellent news. However he does appear to potentially have some infection around the right ankle region  where the maggots were located as well. He will likely require anabiotic therapy today. 05/26/19 on evaluation today patient actually appears to be doing much better in regard to his bilateral lower extremities. I feel like the infection is under much better control. With that being said there were maggots noted when the wrap was removed yet again today. Again this could have potentially been left over from previous although at this time there does not appear to be any signs of significant drainage there was obviously on the wrap some drainage as well this contracted gnats or otherwise. Either way I do not see anything that appears to be doing worse in my pinion and in fact I think his drainage has slowed down quite significantly likely mainly due to the fact to his infection being under better control. 06/02/2019 on evaluation today patient actually appears to be doing well with regard to his bilateral lower extremities there is no signs of active infection at this time which is great news. With that being said he does have several open areas more so on the right than the left but nonetheless these are all significantly better than previously noted. 06/09/2019 on evaluation today patient actually appears to be doing well. His wrap stayed up and he did not cause any problems he had more drainage on the right compared to the left but overall I do not see any major issues at this time which is great news. 06/16/2019 on evaluation today patient appears to be doing excellent with regard to his lower extremities the only area that is open is a new blister that can have opened as of today on the medial ankle on the left. Other than this he really seems to be doing great I see no major issues at this point. 06/23/2019 on evaluation today patient appears to be doing quite well with regard to his bilateral lower extremities. In fact he actually appears to be almost completely healed there is a small area of  weeping noted of the right lower extremity just above the ankle. Nonetheless fortunately there is no signs of active infection at this time which is good news. No fevers, chills, nausea, vomiting, or diarrhea. 8/24; the patient arrived for a nurse visit today but complained of very significant pain in the left leg and therefore I was asked to look at this. Noted that he did not have an open area on the left leg last week nevertheless this was wrapped. The patient states that he is not been able to put his compression pumps on the left leg because of the discomfort. He has not been systemically unwell 06/30/2019 on evaluation today patient unfortunately despite being excellent last week is doing much worse with regard to his left lower extremity today. In fact he had to come in for a nurse on Monday where his left leg had to be rewrapped due to excessive weeping Dr. Leanord Hawking placed him on doxycycline at that point. Fortunately there is no signs of  active infection Systemically at this time which is good news. 07/07/2019 in regard to the patient's wounds today he actually seems to be doing well with his right lower extremity there really is nothing open or draining at this point this is great news. Unfortunately the left lower extremity is given him additional trouble at this time. There does not appear to be any signs of active infection nonetheless he does have a lot of edema and swelling noted at this point as well as blistering all of which has led to a much more poor appearing leg at this time compared to where it was 2 weeks ago when it was almost completely healed. Obviously this is a little discouraging for the patient. He is try to contact the lymphedema clinic in [place] he has not been able to get through to them. 07/14/2019 on evaluation today patient actually appears to be doing slightly better with regard to his left lower extremity ulcers. Overall I do feel like at least at the top of  the wrap that we have been placing this area has healed quite nicely and looks much better. The remainder of the leg is showing signs of improvement. Unfortunately in the thigh area he still has an open region on the left and again on the right he has been utilizing just a Band-Aid on an area that also opened on the thigh. Again this is an area that were not able to wrap although we did do an Ace wrap to provide some compression that something that obviously is a little less effective than the compression wraps we have been using on the lower portion of the leg. He does have an appointment with the lymphedema clinic in [facility] on Friday. 07/21/2019 on evaluation today patient appears to be doing better with regard to his lower extremity ulcers. He has been tolerating the dressing changes without complication. Fortunately there is no signs of active infection at this time. No fevers, chills, nausea, vomiting, or diarrhea. I did receive the paperwork from the physical therapist at the lymphedema clinic in [place]. Subsequently I signed off on that this morning and sent that back to him for further progression with the treatment plan. 07/28/2019 on evaluation today patient appears to be doing very well with regard to his right lower extremity where I do not see any open wounds at this point. Fortunately he is feeling great as far as that is concerned as well. In regard to the left lower extremity he has been having issues with still several areas of weeping and edema although the upper leg is doing better his lower leg still I think is going require the compression wrap at this time. No fevers, chills, nausea, vomiting, or diarrhea. 08/04/2019 on evaluation today patient unfortunately is having new wounds on the right lower extremity. Again we have been using Unna boot wrap on that side. We switched him to using his juxta lite wrap at home. With that being said he tells me he has been using  it although his legs extremely swollen and to be honest really does not appear that he has been. I cannot know that for sure however. Nonetheless he has multiple new wounds on the right lower extremity at this time. Obviously we will have to see about getting this rewrapped for him today. 08/11/2019 on evaluation today patient appears to be doing fairly well with regard to his wounds. He has been tolerating the dressing changes including the compression wraps without complication. He still  has a lot of edema in his upper thigh regions bilaterally he is supposed to be seeing the lymphedema clinic on the 15th of this month once his wraps arrive for the upper part of his legs. 08/18/2019 on evaluation today patient appears to be doing well with regard to his bilateral lower extremities at this point. He has been tolerating the dressing changes without complication. Fortunately there is no signs of active infection which is also good news. He does have a couple weeping areas on the first and second toe of the right foot he also has just a small area on the left foot upper leg and a small area on the left lower leg but overall he is doing quite well in my opinion. He is supposed to be getting his wraps shortly in fact tomorrow and then subsequently is seeing the lymphedema clinic next Wednesday on the 21st. Of note he is also leaving on the 25th to go on vacation for a week to the beach. For that reason and since there is some uncertainty about what there can be doing at lymphedema clinic next Wednesday I am get a make an appointment for next Friday here for [place] to see what we need to do for him prior to him leaving for vacation. 10/23; patient arrives in considerable pain predominantly in the upper posterior calf just distal to the popliteal fossa also in the wound anteriorly above the major wound. This is probably cellulitis and he has had this recurrently in the past. He has no open wound on the right  side and he has had an Radio broadcast assistant in that area. Finally I note that he has an area on the left posterior calf which by enlarge is mostly epithelialized. This protrudes beyond the borders of the surrounding skin in the setting of dry scaly skin and lymphedema. The patient is leaving for [facility] on Sunday. Per his longstanding pattern, he will not take his compression pumps with him predominantly out of fear that they will be stolen. He therefore asked that we put a Unna boot back on the right leg. He will also contact the wound care center in [facility] to see if they can change his dressing in the mid week. 11/3; patient returned from his vacation to [facility]. He was seen on 1 occasion at their wound care center. They did a 2 layer compression system as they did not have our 4-layer wrap. I am not completely certain what they put on the wounds. They did not change the Unna boot on the right. The patient is also seeing a lymphedema specialist physical therapist in [place]. It appears that he has some compression sleeve for his thighs which indeed look quite a bit better than I am used to seeing. He pumps over these with his external compression pumps. 11/10; the patient has a new wound on the right medial thigh otherwise there is no open areas on the right. He has an area on the left leg posteriorly anteriorly and medially and an area over the left second toe. We have been using silver alginate. He thinks the injury on his thigh is secondary to friction from the compression sleeve he has. 11/17; the patient has a new wound on the right medial thigh last week. He thinks this is because he did not have a underlying stocking for his thigh juxta lite apparatus. He now has this. The area is fairly large and somewhat angry but I do not think he  has underlying cellulitis. ooHe has a intact blister on the right anterior tibial area. ooSmall wound on the right great toe dorsally ooSmall  area on the medial left calf. Objective Constitutional Patient is hypertensive.. Pulse regular and within target range for patient.Marland Kitchen Respirations regular, non-labored and within target range.. Temperature is normal and within the target range for the patient.Marland Kitchen Appears in no distress. Vitals Time Taken: 9:55 AM, Height: 70 in, Weight: 380.2 lbs, BMI: 54.5, Temperature: 97.7 F, Pulse: 57 bpm, Respiratory Rate: 18 breaths/min, Blood Pressure: 167/71 mmHg, Capillary Blood Glucose: 176 mg/dl. General Notes: patient reported blood sugar of 176 this morning Eyes Conjunctivae clear. No discharge.no icterus. Respiratory work of breathing is normal. Cardiovascular No obvious CHF. Lymphatic None palpable in the popliteal area. Psychiatric appears at normal baseline. General Notes: Wound exam ooRight lower leg open blister on the tibial area. I evacuated this silver alginate ooRight medial thigh wound with some surrounding erythema but absolutely no tenderness and no evidence of cellulitis. He thinks this is all irritation and I tend to agree with him ooSmall superficial area on the right great toe ooArea on the left second toe seems healed ooSmall area on the left lateral calf Integumentary (Hair, Skin) Severe stasis dermatitis secondary lymphedema bilaterally.. The patient has a raised papule in the left posterior thigh. I have been watching this seems unchanged firm. Wound #140 status is Open. Original cause of wound was Blister. The wound is located on the Left,Posterior Lower Leg. The wound measures 0.3cm length x 0.3cm width x 0.1cm depth; 0.071cm^2 area and 0.007cm^3 volume. There is Fat Layer (Subcutaneous Tissue) Exposed exposed. There is no tunneling or undermining noted. There is a medium amount of serous drainage noted. The wound margin is flat and intact. There is large (67-100%) red, pink granulation within the wound bed. There is no necrotic tissue within the wound bed. Wound  #142 status is Open. Original cause of wound was Blister. The wound is located on the Left,Anterior Lower Leg. The wound measures 1cm length x 1cm width x 0.1cm depth; 0.785cm^2 area and 0.079cm^3 volume. There is Fat Layer (Subcutaneous Tissue) Exposed exposed. There is no tunneling or undermining noted. There is a medium amount of serous drainage noted. The wound margin is flat and intact. There is large (67-100%) pink granulation within the wound bed. There is no necrotic tissue within the wound bed. Wound #145 status is Open. Original cause of wound was Gradually Appeared. The wound is located on the Right,Medial Upper Leg. The wound measures 1.5cm length x 2cm width x 0.1cm depth; 2.356cm^2 area and 0.236cm^3 volume. There is Fat Layer (Subcutaneous Tissue) Exposed exposed. There is no tunneling or undermining noted. There is a medium amount of serous drainage noted. The wound margin is flat and intact. There is large (67-100%) pink granulation within the wound bed. There is a small (1-33%) amount of necrotic tissue within the wound bed including Adherent Slough. Wound #146 status is Open. Original cause of wound was Gradually Appeared. The wound is located on the Left Toe Second. The wound measures 0.2cm length x 0.2cm width x 0.1cm depth; 0.031cm^2 area and 0.003cm^3 volume. There is Fat Layer (Subcutaneous Tissue) Exposed exposed. There is no tunneling or undermining noted. There is a medium amount of serous drainage noted. The wound margin is flat and intact. There is large (67-100%) red granulation within the wound bed. There is no necrotic tissue within the wound bed. 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 There was a Four Layer Compression Therapy Procedure by Yevonne Pax, RN. Post procedure Diagnosis Wound #140: Same as Pre-Procedure Wound #142 Pre-procedure diagnosis of Wound #142 is a Lymphedema located on the Left,Anterior Lower Leg . There was a Four Layer Compression Therapy Procedure by Yevonne Pax, RN. Post procedure Diagnosis Wound #142: Same as Pre-Procedure Wound #145 Pre-procedure diagnosis of Wound #145 is a Lymphedema located on the Right,Medial Upper Leg . There was a Four Layer Compression Therapy Procedure by Yevonne Pax, RN. Post procedure Diagnosis Wound #145: Same as Pre-Procedure Wound #146 Pre-procedure diagnosis of Wound #146 is a Lymphedema located on the Left Toe Second . There was a Four Layer Compression Therapy Procedure by Yevonne Pax, RN. Post procedure Diagnosis Wound #146: Same as Pre-Procedure There was a Radio broadcast assistant Compression Therapy Procedure by Yevonne Pax, RN. Post procedure Diagnosis Wound #: Same as Pre-Procedure Plan Follow-up Appointments: Return Appointment in 2 weeks. Nurse Visit: - next week Dressing Change Frequency: Do not change entire dressing for one week. - both legs Skin Barriers/Peri-Wound Care: TCA Cream or Ointment - mix with lotion both legs Wound Cleansing: May shower with protection. Primary Wound Dressing: Wound #140 Left,Posterior Lower Leg: Calcium Alginate with Silver Wound #142 Left,Anterior Lower Leg: Calcium Alginate with Silver Wound #145 Right,Medial Upper Leg: Calcium Alginate with Silver Wound #146 Left Toe Second: Calcium Alginate with Silver Secondary Dressing: Wound #140 Left,Posterior Lower Leg: Dry Gauze ABD pad Wound #142 Left,Anterior Lower  Leg: Dry Gauze ABD pad Foam Border Wound #145 Right,Medial Upper Leg: Foam Border Wound #146 Left Toe Second: Kerlix/Rolled Gauze - secure with tape Dry Gauze Edema Control: 4 layer compression: Left lower extremity - unna boot at upper portion of lower leg. Unna Boot to Right Lower Extremity Avoid standing for long periods of time Elevate legs to the level of the heart or above for 30 minutes daily and/or when sitting, a frequency of: Exercise regularly Segmental Compressive Device. - lymphadema pumps 60 min 2 times per day 1. The usual dressings TCA silver alginate bilateral compression 2. Unfortunately the blister on the right mid tibia precludes trying to transition him to his own stocking 3. He has an undergarment for the area on the right medial thigh which I think is going to solve the problem here nevertheless we will continue to dress this 4. Area on the left medial calf is about all I can see on the left leg 5 he continues to use his compression pumps twice a day 6. I watched the nodule on his posterior thigh. If this shows signs of changing or enlarging it will need to be biopsied Electronic Signature(s) Signed: 09/21/2019 6:07:35 PM By: Baltazar Najjar MD Entered By: Baltazar Najjar on 09/21/2019 10:50:57 -------------------------------------------------------------------------------- SuperBill Details Patient Name: Date of Service: [name] 09/21/2019 Medical Record [id number] Patient Account Number: [id number] Date of Birth/Sex: Treating RN: [id number] (68 y.o. Melonie [place] Primary Care Provider: Nicoletta [name] Other Clinician: Referring Provider: Treating Provider/Extender:Robson, Lottie Rater, [name] in Treatment: 191 Diagnosis Coding ICD-10 Codes Code Description L97.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 right calf  limited to breakdown of skin L97.221 Non-pressure chronic ulcer of left calf limited to breakdown of skin I87.333 Chronic  venous hypertension (idiopathic) with ulcer and inflammation of bilateral lower extremity I89.0 Lymphedema, not elsewhere classified E11.622 Type 2 diabetes mellitus with other skin ulcer 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, unspecified L03.116 Cellulitis of left lower limb Facility Procedures CPT4 Code Description: 16109604 (Facility Use Only) [phone] - APPLY MULTLAY COMPRS LWR LT LEG Modifier: Quantity: 1 CPT4 Code Description: 91478295 (Facility Use Only) 62130QM - APPLY UNNA BOOT RT Modifier: Quantity: 1 Physician Procedures CPT4: Description Modifier Quantity Code [id number] - WC PHYS LEVEL 3 - EST PT 1 ICD-10 Diagnosis Description 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

## 2019-10-12 NOTE — Progress Notes (Signed)
Kevin, [name] ([id number]) Visit Report for 08/27/2019 Arrival Information Details Patient Name: Date of Service: [name], [name] 08/27/2019 10:30 AM Medical Record [id number] Patient Account Number: [id number] Date of Birth/Sex: Treating RN: 06-Nov-1950 (68 y.o. [name] Primary Care Promiss Labarbera: [name] Other Clinician: Referring Caley Volkert: Treating Lia Vigilante/Extender:Robson, Peyton Najjar, [name] in Treatment: 77 Visit Information History Since Last Visit Walker Added or deleted any medications: No Patient Arrived: Any new allergies or adverse reactions: No Arrival Time: 10:38 Had a fall or experienced change in No Accompanied By: self activities of daily living that may affect Transfer Assistance: None risk of falls: Patient Identification Verified: Yes Signs or symptoms of abuse/neglect since last No Secondary Verification Process Completed: Yes visito Patient Requires Transmission-Based No Hospitalized since last visit: No Precautions: Implantable device outside of the clinic excluding No Patient Has Alerts: Yes cellular tissue based products placed in the center since last visit: Has Dressing in Place as Prescribed: Yes Pain Present Now: Yes Electronic Signature(s) Signed: 10/12/2019 3:02:15 PM By: Sandre Kitty Entered By: Sandre Kitty on 08/27/2019 10:39:49 -------------------------------------------------------------------------------- Compression Therapy Details Patient Name: Date of Service: [name] 08/27/2019 10:30 AM Medical Record [id number] Patient Account Number: [id number] Date of Birth/Sex: Treating RN: Oct 23, 1951 (68 y.o. [name] Primary Care Sreenidhi Ganson: [name] Other Clinician: Referring Jakelyn Squyres: Treating Maragret Vanacker/Extender:Robson, Peyton Najjar, [name] in Treatment: 187 Compression Therapy Performed for Wound Wound #132R Left,Medial Upper Leg Assessment: Performed By:  Clinician Deon Pilling, RN Compression Type: Four Layer Post Procedure Diagnosis Same as Pre-procedure Electronic Signature(s) Signed: 08/30/2019 5:31:38 PM By: Deon Pilling Entered By: Deon Pilling on 08/27/2019 11:26:22 -------------------------------------------------------------------------------- Compression Therapy Details Patient Name: Date of Service: Kevin, [name] 08/27/2019 10:30 AM Medical Record [id number] Patient Account Number: [id number] Date of Birth/Sex: Treating RN: Jun 20, 1951 (68 y.o. [name] Primary Care Laquon Emel: [name] Other Clinician: Referring Sinjin Amero: Treating Shin Lamour/Extender:Robson, Peyton Najjar, [name] in Treatment: 187 Compression Therapy Performed for Wound Wound #140 Left,Circumferential Lower Leg Assessment: Performed By: Clinician Deon Pilling, RN Compression Type: Four Layer Post Procedure Diagnosis Same as Pre-procedure Electronic Signature(s) Signed: 08/30/2019 5:31:38 PM By: Deon Pilling Entered By: Deon Pilling on 08/27/2019 11:26:31 -------------------------------------------------------------------------------- Compression Therapy Details Patient Name: Date of Service: Kevin, [name] 08/27/2019 10:30 AM Medical Record [id number] Patient Account Number: [id number] Date of Birth/Sex: Treating RN: 1951-11-04 (68 y.o. [name] Primary Care [name]: [name] Other Clinician: Referring Myliyah Rebuck: Treating Ramesh Moan/Extender:Robson, Peyton Najjar, [name] in Treatment: 187 Compression Therapy Performed for Wound NonWound Condition Lymphedema - Right Leg Assessment: Performed By: Clinician Deon Pilling, RN Compression Type: [name] Post Procedure Diagnosis Same as Pre-procedure Electronic Signature(s) Signed: 08/30/2019 5:31:38 PM By: Deon Pilling Entered By: Deon Pilling on 08/27/2019  11:26:39 -------------------------------------------------------------------------------- Encounter Discharge Information Details Patient Name: Date of Service: [name] 08/27/2019 10:30 AM Medical Record [id number] Patient Account Number: [id number] Date of Birth/Sex: Treating RN: [birth year] (68 y.o. [name] Primary Care Ohana Birdwell: [name] Other Clinician: Referring Gerik Coberly: Treating Enrica Corliss/Extender:Robson, Peyton Najjar, [name] in Treatment: 187 Encounter Discharge Information Items Discharge Condition: Stable Ambulatory Status: Walker Discharge Destination: Home Transportation: Private Auto Accompanied By: self Schedule Follow-up Appointment: Yes Clinical Summary of Care: Patient Declined Electronic Signature(s) Signed: 08/30/2019 5:21:50 PM By: Kela Millin Entered By: Kela Millin on 08/27/2019 12:23:35 -------------------------------------------------------------------------------- Lower Extremity Assessment Details Patient Name: Date of Service: NAKSH, [name] 08/27/2019 10:30 AM Medical Record [id number] Patient Account Number: [id number] Date of Birth/Sex: Treating RN: 1951-07-18 (68 y.o. Jonette Eva,  Gaffney Primary Care Rosemae Mcquown: [name] Other Clinician: Referring Clary Meeker: Treating Taksh Hjort/Extender:Robson, Peyton Najjar, [name] in Treatment: 187 Edema Assessment Assessed: [Left: Yes] [Right: Yes] Edema: [Left: Yes] [Right: Yes] Calf Left: Right: Point of Measurement: 31 cm From Medial Instep 43 cm 43 cm Ankle Left: Right: Point of Measurement: 12 cm From Medial Instep 26 cm 28 cm Vascular Assessment Pulses: Dorsalis Pedis Palpable: [Right:Yes] Electronic Signature(s) Signed: 08/27/2019 5:59:58 PM By: Levan Hurst RN, BSN Signed: 10/12/2019 3:02:15 PM By: Sandre Kitty Entered By: Sandre Kitty on 08/27/2019  11:10:04 -------------------------------------------------------------------------------- [place] Details Patient Name: Date of Service: Kevin, [name] 08/27/2019 10:30 AM Medical Record [id number] Patient Account Number: [id number] Date of Birth/Sex: Treating RN: 1951/02/04 (68 y.o. [name] Primary Care Carlethia Mesquita: [name] Other Clinician: Referring Jishnu Jenniges: Treating Aliannah Holstrom/Extender:Robson, Peyton Najjar, [name] in Treatment: 187 Active Inactive Venous Leg Ulcer Nursing Diagnoses: Actual venous Insuffiency (use after diagnosis is confirmed) Goals: Patient will maintain optimal edema control Date Initiated: 09/10/2016 Target Resolution Date: 09/03/2019 Goal Status: Active Verify adequate tissue perfusion prior to therapeutic compression application Date Initiated: 09/10/2016 Date Inactivated: 11/28/2016 Goal Status: Met Interventions: Assess peripheral edema status every visit. Compression as ordered Provide education on venous insufficiency Notes: edema not contolled above wraps, pt not using lymoh pumps regularly Wound/Skin Impairment Nursing Diagnoses: Impaired tissue integrity Goals: Patient/caregiver will verbalize understanding of skin care regimen Date Initiated: 09/10/2016 Target Resolution Date: 09/03/2019 Goal Status: Active Interventions: Assess patient/caregiver ability to perform ulcer/skin care regimen upon admission and as needed Assess ulceration(s) every visit Provide education on ulcer and skin care Notes: Electronic Signature(s) Signed: 08/27/2019 5:59:58 PM By: Levan Hurst RN, BSN Signed: 08/30/2019 5:31:38 PM By: Deon Pilling Entered By: Deon Pilling on 08/27/2019 11:25:42 -------------------------------------------------------------------------------- Pain Assessment Details Patient Name: Date of Service: [name] 08/27/2019 10:30 AM Medical Record [id number] Patient  Account Number: [id number] Date of Birth/Sex: Treating RN: July 20, 1951 (68 y.o. [name] Primary Care Loretto Belinsky: [name] Other Clinician: Referring Theona Muhs: Treating Broc Caspers/Extender:Robson, Peyton Najjar, [name] in Treatment: 187 Active Problems Location of Pain Severity and Description of Pain Patient Has Paino Yes Site Locations Rate the pain. Current Pain Level: 4 Pain Management and Medication Current Pain Management: Electronic Signature(s) Signed: 08/27/2019 5:59:58 PM By: Levan Hurst RN, BSN Signed: 10/12/2019 3:02:15 PM By: Sandre Kitty Entered By: Sandre Kitty on 08/27/2019 10:42:40 -------------------------------------------------------------------------------- Patient/Caregiver Education Details Patient Name: [name] 10/23/2020andnbsp10:30 Date of Service: AM Medical Record [id number] Number: Patient Account Number: [id number] Treating RN: Date of Birth/Gender: 08-04-1951 (68 y.o. [name]) Other Clinician: Primary Care Physician: [name] Treating Linton Ham Referring Physician: Physician/Extender: Deno Etienne in Treatment: 187 Education Assessment Education Provided To: Patient Education Topics Provided Wound/Skin Impairment: Handouts: Skin Care Do's and Dont's Methods: Explain/Verbal Responses: Reinforcements needed Electronic Signature(s) Signed: 08/30/2019 5:31:38 PM By: Deon Pilling Entered By: Deon Pilling on 08/27/2019 11:25:56 -------------------------------------------------------------------------------- Wound Assessment Details Patient Name: Date of Service: KAILEN, HINKLE 08/27/2019 10:30 AM Medical Record [id number] Patient Account Number: [id number] Date of Birth/Sex: Treating RN: [birth year] (68 y.o. [name] Primary Care Allia Wiltsey: [name] Other Clinician: Referring Eternity Dexter: Treating Shirlette Scarber/Extender:Robson, Peyton Najjar, [name]  in Treatment: 187 Wound Status Wound Number: [id number] Primary Lymphedema Etiology: Wound Location: Left Upper Leg - Medial Wound Open Wounding Event: Blister Status: Date Acquired: 07/14/2019 Comorbid Chronic sinus problems/congestion, Weeks Of Treatment: 6 History: Arrhythmia, Hypertension, Peripheral Arterial Clustered Wound: No Disease, Type II Diabetes, History of Burn, Gout, Confinement Anxiety Photos  Wound Measurements Length: (cm) 1 % Reduc Width: (cm) 3 % Reduc Depth: (cm) 0.1 Epithel Area: (cm) 2.356 Tunnel Volume: (cm) 0.236 Underm Wound Description Classification: Full Thickness Without Exposed Support Structures Wound Distinct, outline attached Margin: Exudate Medium Amount: Exudate Serosanguineous Type: Exudate red, brown Color: Wound Bed Granulation Amount: Medium (34-66%) Granulation Quality: Pink, Pale Necrotic Amount: Medium (34-66%) Necrotic Quality: Adherent Slough Foul Odor After Cleansing: No Slough/Fibrino Yes Exposed Structure Fascia Exposed: No Fat Layer (Subcutaneous Tissue) Exposed: No Tendon Exposed: No Muscle Exposed: No Joint Exposed: No Bone Exposed: No tion in Area: 33.3% tion in Volume: 33.1% ialization: None ing: No ining: No Electronic Signature(s) Signed: 08/30/2019 4:53:58 PM By: Mikeal Hawthorne EMT/HBOT Signed: 08/30/2019 6:04:10 PM By: Levan Hurst RN, BSN Previous Signature: 08/27/2019 5:59:58 PM Version By: Levan Hurst RN, BSN Entered By: Mikeal Hawthorne on 08/30/2019 08:39:37 -------------------------------------------------------------------------------- Wound Assessment Details Patient Name: Date of Service: [name] 08/27/2019 10:30 AM Medical Record [id number] Patient Account Number: [id number] Date of Birth/Sex: Treating RN: July 30, 1951 (68 y.o. [name] Primary Care Rumeal Cullipher: [name] Other Clinician: Referring Murlin [name]: Treating Brynne Doane/Extender:Robson, Peyton Najjar,  [name] in Treatment: 187 Wound Status Wound Number: [id number] Primary Lymphedema Etiology: Wound Location: Right Lower Leg - Medial, Proximal Wound Healed - Epithelialized Status: Wounding Event: Blister Comorbid Chronic sinus problems/congestion, Date Acquired: 08/01/2019 History: Arrhythmia, Hypertension, Peripheral Arterial Weeks Of Treatment: 3 Disease, Type II Diabetes, History of Burn, Clustered Wound: No Gout, Confinement Anxiety Photos Wound Measurements Length: (cm) 0 % Reduc Width: (cm) 0 % Reduc Depth: (cm) 0 Epithel Area: (cm) 0 Volume: (cm) 0 Wound Description Classification: Full Thickness Without Exposed Support Structures Exudate Medium Amount: Exudate Serosanguineous Type: Exudate red, brown Color: Wound Bed Granulation Amount: Small (1-33%) Granulation Quality: Pink Necrotic Amount: Large (67-100%) Necrotic Quality: Adherent Slough Foul Odor After Cleansing: No Slough/Fibrino Yes Exposed Structure Fascia Exposed: No Fat Layer (Subcutaneous Tissue) Exposed: Yes Tendon Exposed: No Muscle Exposed: No Joint Exposed: No Bone Exposed: No tion in Area: 100% tion in Volume: 100% ialization: None Electronic Signature(s) Signed: 08/30/2019 4:53:58 PM By: Mikeal Hawthorne EMT/HBOT Signed: 08/30/2019 6:04:10 PM By: Levan Hurst RN, BSN Previous Signature: 08/27/2019 5:59:58 PM Version By: Levan Hurst RN, BSN Entered By: Mikeal Hawthorne on 08/30/2019 08:42:31 -------------------------------------------------------------------------------- Wound Assessment Details Patient Name: Date of Service: [name] 08/27/2019 10:30 AM Medical Record [id number] Patient Account Number: [id number] Date of Birth/Sex: Treating RN: 1950/12/30 (68 y.o. [name] Primary Care Lirio Bach: [name] Other Clinician: Referring Nawaal Alling: Treating Abbigail Anstey/Extender:Robson, Peyton Najjar, [name] in Treatment: 187 Wound Status Wound  Number: [id number] Primary Diabetic Wound/Ulcer of the Lower Extremity Etiology: Wound Location: Right Toe Second - Anterior Wound Healed - Epithelialized Wounding Event: Gradually Appeared Status: Date Acquired: 08/08/2019 Comorbid Chronic sinus problems/congestion, Weeks Of Treatment: 2 History: Arrhythmia, Hypertension, Peripheral Arterial Clustered Wound: Yes Disease, Type II Diabetes, History of Burn, Gout, Confinement Anxiety Photos Wound Measurements Length: (cm) 0 % Reductio Width: (cm) 0 % Reductio Depth: (cm) 0 Epithelial Area: (cm) 0 Volume: (cm) 0 Wound Description Classification: Grade 2 Foul Odor Exudate Amount: Small Slough/Fi Exudate Type: Serosanguineous Exudate Color: red, brown Wound Bed Granulation Amount: Medium (34-66%) Granulation Quality: Pink Fascia Ex Necrotic Amount: Medium (34-66%) Fat Layer Necrotic Quality: Adherent Slough Tendon Ex Muscle Ex Joint Exp Bone Expo After Cleansing: No brino Yes Exposed Structure posed: No (Subcutaneous Tissue) Exposed: Yes posed: No posed: No osed: No sed: No n in Area: 100% n in Volume: 100% ization: None Electronic  Signature(s) Signed: 08/30/2019 4:53:58 PM By: Mikeal Hawthorne EMT/HBOT Signed: 08/30/2019 6:04:10 PM By: Levan Hurst RN, BSN Previous Signature: 08/27/2019 5:59:58 PM Version By: Levan Hurst RN, BSN Entered By: Mikeal Hawthorne on 08/30/2019 08:41:28 -------------------------------------------------------------------------------- Wound Assessment Details Patient Name: Date of Service: WHALEN, TROMPETER 08/27/2019 10:30 AM Medical Record [id number] Patient Account Number: [id number] Date of Birth/Sex: Treating RN: [birth year] (68 y.o. [name] Primary Care Kaesha Kirsch: [name] Other Clinician: Referring Ormond Lazo: Treating Aboubacar Matsuo/Extender:Robson, Peyton Najjar, [name] in Treatment: 187 Wound Status Wound Number: [id number] Primary Lymphedema Etiology: Wound  Location: Left Upper Leg - Anterior Wound Healed - Epithelialized Wounding Event: Blister Status: Date Acquired: 08/13/2019 Comorbid Chronic sinus problems/congestion, Weeks Of Treatment: 1 History: Arrhythmia, Hypertension, Peripheral Arterial Clustered Wound: No Disease, Type II Diabetes, History of Burn, Gout, Confinement Anxiety Photos Wound Measurements Length: (cm) 0 % Reduc Width: (cm) 0 % Reduc Depth: (cm) 0 Epithel Area: (cm) 0 Volume: (cm) 0 Wound Description Full Thickness Without Exposed Support Classification: Structures Exudate Large Amount: Exudate Serosanguineous Type: Exudate red, brown Color: Wound Bed Granulation Amount: Large (67-100%) Granulation Quality: Red Foul Odor After Cleansing: No Slough/Fibrino No Exposed Structure Fascia Exposed: No Fat Layer (Subcutaneous Tissue) Exposed: Yes Tendon Exposed: No Muscle Exposed: No Joint Exposed: No Bone Exposed: No tion in Area: 100% tion in Volume: 100% ialization: None Electronic Signature(s) Signed: 08/30/2019 4:53:58 PM By: Mikeal Hawthorne EMT/HBOT Signed: 08/30/2019 6:04:10 PM By: Levan Hurst RN, BSN Previous Signature: 08/27/2019 5:59:58 PM Version By: Levan Hurst RN, BSN Entered By: Mikeal Hawthorne on 08/30/2019 08:40:29 -------------------------------------------------------------------------------- Wound Assessment Details Patient Name: Date of Service: [name] 08/27/2019 10:30 AM Medical Record [id number] Patient Account Number: [id number] Date of Birth/Sex: Treating RN: 04-Feb-1951 (68 y.o. [name] Primary Care Debarah Mccumbers: [name] Other Clinician: Referring Noya Santarelli: Treating Paulyne Mooty/Extender:Robson, Peyton Najjar, [name] in Treatment: 187 Wound Status Wound Number: [id number] Primary Lymphedema Etiology: Wound Location: Left Lower Leg - Circumferential Wound Open Wounding Event: Blister Status: Date Acquired: 08/27/2019 Comorbid Chronic  sinus problems/congestion, Weeks Of Treatment: 0 History: Arrhythmia, Hypertension, Peripheral Arterial Clustered Wound: No Disease, Type II Diabetes, History of Burn, Gout, Confinement Anxiety Photos Wound Measurements Length: (cm) 12.5 % Reduc Width: (cm) 38 % Reduc Depth: (cm) 0.1 Epithel Area: (cm) 373.064 Tunnel Volume: (cm) 37.306 Underm Wound Description Classification: Full Thickness Without Exposed Support Foul O Structures Classification: Structures Due to P Wound Slough/F Distinct, outline attached Margin: Exudate Large Amount: Exudate Serosanguineous Type: Exudate red, brown Color: Wound Bed Granulation Amount: Large (67-100%) Granulation Quality: Red, Pink Fascia Ex Necrotic Amount: Small (1-33%) Fat Layer Necrotic Quality: Adherent Slough Tendon Ex Muscle Ex Joint Exp Bone Expo dor After Cleansing: Yes roduct Use: No ibrino Yes Exposed Structure posed: No (Subcutaneous Tissue) Exposed: No posed: No posed: No osed: No sed: No tion in Area: 0% tion in Volume: 0% ialization: None ing: No ining: No Electronic Signature(s) Signed: 08/30/2019 4:53:58 PM By: Mikeal Hawthorne EMT/HBOT Signed: 08/30/2019 6:04:10 PM By: Levan Hurst RN, BSN Previous Signature: 08/27/2019 5:59:58 PM Version By: Levan Hurst RN, BSN Entered By: Mikeal Hawthorne on 08/30/2019 08:43:05 -------------------------------------------------------------------------------- [place] Details Patient Name: Date of Service: [name]. 08/27/2019 10:30 AM Medical Record [id number] Patient Account Number: [id number] Date of Birth/Sex: Treating RN: 01-May-1951 (68 y.o. [name] Primary Care Tyechia Allmendinger: [name] Other Clinician: Referring Braxtyn Dorff: Treating Reynald Woods/Extender:Robson, Peyton Najjar, [name] in Treatment: 187 Vital Signs Time Taken: 10:39 Temperature (F): 98.3 Height (in): 70 Pulse (bpm): 54 Weight (lbs): 380.2  Respiratory Rate  (breaths/min): 18 Body Mass Index (BMI): 54.5 Blood Pressure (mmHg): 174/54 Reference Range: 80 - 120 mg / dl Electronic Signature(s) Signed: 10/12/2019 3:02:15 PM By: Sandre Kitty Entered By: Sandre Kitty on 08/27/2019 10:42:25

## 2019-10-12 NOTE — Progress Notes (Signed)
STORY, VANVRANKEN ([id number]) Visit Report for 09/14/2019 Arrival Information Details Patient Name: Date of Service: [name], [name] 09/14/2019 10:00 AM Medical Record [id number] Patient Account Number: [id number] Date of Birth/Sex: Treating RN: 03/01/1951 (68 y.o. [name]) [name] Primary Care Sherlin Sonier: [name] Other Clinician: Referring Vasili Fok: Treating Terrelle Ruffolo/Extender:Robson, Peyton Najjar, [name] in Treatment: 190 Visit Information History Since Last Visit Walker All ordered tests and consults were completed: Yes Patient Arrived: Added or deleted any medications: No Arrival Time: 09:54 Any new allergies or adverse reactions: No Accompanied By: self Had a fall or experienced change in No Transfer Assistance: None activities of daily living that may affect Patient Identification Verified: Yes risk of falls: Secondary Verification Process Completed: Yes Signs or symptoms of abuse/neglect since last No Patient Requires Transmission-Based No visito Precautions: Hospitalized since last visit: No Patient Has Alerts: Yes Implantable device outside of the clinic excluding No cellular tissue based products placed in the center since last visit: Has Dressing in Place as Prescribed: Yes Pain Present Now: No Electronic Signature(s) Signed: 09/14/2019 2:49:59 PM By: Baruch Gouty RN, BSN Entered By: Baruch Gouty on 09/14/2019 09:56:04 -------------------------------------------------------------------------------- Compression Therapy Details Patient Name: Date of Service: Kevin Calico 09/14/2019 10:00 AM Medical Record [id number] Patient Account Number: [id number] Date of Birth/Sex: Treating RN: Dec 04, 1950 (68 y.o. [name]) [name] Primary Care Burdette Gergely: [name] Other Clinician: Referring Lorn Butcher: Treating Elika Godar/Extender:Robson, Peyton Najjar, [name] in Treatment: 190 Compression Therapy Performed for Wound Wound #140  Left,Posterior,Circumferential Lower Leg Assessment: Performed By: Jake Church, RN Compression Type: Four Layer Post Procedure Diagnosis Same as Pre-procedure Electronic Signature(s) Signed: 10/12/2019 2:53:59 PM By: [name] RN Entered By: [name] on 09/14/2019 13:23:00 -------------------------------------------------------------------------------- Compression Therapy Details Patient Name: Date of Service: [name], [name] 09/14/2019 10:00 AM Medical Record [id number] Patient Account Number: [id number] Date of Birth/Sex: Treating RN: [birth year] (68 y.o. [name]) [name] Primary Care Leverne Amrhein: [name] Other Clinician: Referring Santana Edell: Treating Jerimah Witucki/Extender:Robson, Peyton Najjar, [name] in Treatment: 190 Compression Therapy Performed for Wound Wound #142 Left,Anterior Lower Leg Assessment: Performed By: Jake Church, RN Compression Type: Four Layer Post Procedure Diagnosis Same as Pre-procedure Electronic Signature(s) Signed: 10/12/2019 2:53:59 PM By: [name] RN Entered By: [name] on 09/14/2019 13:23:00 -------------------------------------------------------------------------------- Compression Therapy Details Patient Name: Date of Service: [name], [name] 09/14/2019 10:00 AM Medical Record [id number] Patient Account Number: [id number] Date of Birth/Sex: Treating RN: 09/01/1951 (68 y.o. [name]) [name] Primary Care Pegge Cumberledge: [name] Other Clinician: Referring Kalani Baray: Treating Floria Brandau/Extender:Robson, Peyton Najjar, [name] in Treatment: 190 Compression Therapy Performed for Wound NonWound Condition Lymphedema - Right Leg Assessment: Performed By: Jake Church, RN Compression Type: [name] Post Procedure Diagnosis Same as Pre-procedure Electronic Signature(s) Signed: 10/12/2019 2:53:59 PM By: [name] RN Entered By: [name] on 09/14/2019  13:24:34 -------------------------------------------------------------------------------- Encounter Discharge Information Details Patient Name: Date of Service: Kevin Calico. 09/14/2019 10:00 AM Medical Record [id number] Patient Account Number: [id number] Date of Birth/Sex: Treating RN: March 03, 1951 (68 y.o. [name] Primary Care Makayia Duplessis: [name] Other Clinician: Referring Shantell Belongia: Treating Keaghan Bowens/Extender:Robson, Peyton Najjar, [name] in Treatment: 190 Encounter Discharge Information Items Discharge Condition: Stable Ambulatory Status: Walker Discharge Destination: Home Transportation: Private Auto Accompanied By: self Schedule Follow-up Appointment: Yes Clinical Summary of Care: Patient Declined Electronic Signature(s) Signed: 09/17/2019 5:20:59 PM By: Kela Millin Entered By: Kela Millin on 09/14/2019 12:06:50 -------------------------------------------------------------------------------- Lower Extremity Assessment Details Patient Name: Date of Service: [name], [name] 09/14/2019 10:00 AM Medical Record [id number] Patient  Account Number: [id number] Date of Birth/Sex: Treating RN: [birth year] (68 y.o. [name] Primary Care Mykai Wendorf: [name] Other Clinician: Referring Linzie Boursiquot: Treating Noella Kipnis/Extender:Robson, Peyton Najjar, [name] in Treatment: 190 Edema Assessment Assessed: [Left: No] [Right: No] Edema: [Left: Yes] [Right: Yes] Calf Left: Right: Point of Measurement: 31 cm From Medial Instep 38.1 cm 38.7 cm Ankle Left: Right: Point of Measurement: 12 cm From Medial Instep 25.6 cm 26.4 cm Vascular Assessment Pulses: Dorsalis Pedis Palpable: [Left:Yes] [Right:No] Electronic Signature(s) Signed: 09/14/2019 2:49:59 PM By: Baruch Gouty RN, BSN Entered By: Baruch Gouty on 09/14/2019 10:03:50 -------------------------------------------------------------------------------- Multi Wound  Chart Details Patient Name: Date of Service: Kevin Calico 09/14/2019 10:00 AM Medical Record [id number] Patient Account Number: [id number] Date of Birth/Sex: Treating RN: Aug 07, 1951 (68 y.o. [name]) [name] Primary Care Orlen Leedy: [name] Other Clinician: Referring Fiana Gladu: Treating Kieon Lawhorn/Extender:Robson, Peyton Najjar, [name] in Treatment: 190 Vital Signs Height(in): 70 Capillary Blood 154 Glucose(mg/dl): Weight(lbs): 380.2 Pulse(bpm): 52 Body Mass Index(BMI): 30 Blood Pressure(mmHg): 164/53 Temperature(F): 98.3 Respiratory 20 Rate(breaths/min): Photos: [140:No Photos] [141:No Photos] [142:No Photos] Wound Location: [140:Left Lower Leg - Posterior, Circumferential] [141:Left Lower Leg - Medial] [142:Left Lower Leg - Anterior] Wounding Event: [140:Blister] [141:Blister] [142:Blister] Primary Etiology: [140:Lymphedema] [141:Lymphedema] [142:Lymphedema] Secondary Etiology: [140:N/A] [141:N/A] [142:N/A] Comorbid History: [140:Chronic sinus problems/congestion, Arrhythmia, Hypertension, Peripheral Arterial Disease, Type II Diabetes, History of Burn, Gout, Confinement Anxiety] [141:Chronic sinus problems/congestion, Arrhythmia, Hypertension, Peripheral  Arterial Disease, Type II Diabetes, History of Burn, Gout, Confinement Anxiety] [142:Chronic sinus problems/congestion, Arrhythmia, Hypertension, Peripheral Arterial Disease, Type II Diabetes, History of Burn, Gout, Confinement Anxiety] Date Acquired: [140:08/27/2019] [141:09/07/2019] [142:09/07/2019] Weeks of Treatment: [140:2] [141:1] [142:1] Wound Status: [140:Open] [141:Open] [142:Open] Measurements L x W x D 0.5x0.5x0.1 [141:0x0x0] [142:1.7x1x0.1] (cm) Area (cm) : [140:0.196] [141:0] [142:1.335] Volume (cm) : [140:0.02] [141:0] [142:0.134] % Reduction in Area: [140:99.90%] [141:100.00%] [142:41.20%] % Reduction in Volume: [140:99.90%] [141:100.00%] [142:41.00%] Classification: [140:Full Thickness  Without Exposed Support Structures Exposed Support Structures Exposed Support Structures] [141:Full Thickness Without] [142:Full Thickness Without] Exudate Amount: [140:Medium] [141:None Present] [142:Medium] Exudate Type: [140:Serosanguineous] [141:N/A] [142:Serosanguineous] Exudate Color: [140:red, brown] [141:N/A] [142:red, brown] Wound Margin: [140:Flat and Intact] [141:Flat and Intact] [142:Flat and Intact] Granulation Amount: [140:Large (67-100%)] [141:None Present (0%)] [142:Large (67-100%)] Granulation Quality: [140:Red] [141:N/A] [142:Pink] Necrotic Amount: [140:None Present (0%)] [141:None Present (0%)] [142:None Present (0%)] Exposed Structures: [140:Fat Layer (Subcutaneous Fascia: No Tissue) Exposed: Yes Fascia: No Tendon: No Muscle: No Joint: No Bone: No] [141:Fat Layer (Subcutaneous Fat Layer (Subcutaneous Tissue) Exposed: No Tendon: No Muscle: No Joint: No Bone: No] [142:Fascia: No Tissue)  Exposed: No Tendon: No Muscle: No Joint: No Bone: No] Epithelialization: [140:Medium (34-66%)] [141:Large (67-100%) 143 144] [142:Large (67-100%) 145] Photos: [140:No Photos] [141:No Photos] [142:No Photos] Wound Location: [140:Left Lower Leg - Lateral] [141:Left Toe Fourth] [142:Right Upper Leg - Medial] Wounding Event: [140:Gradually Appeared] [141:Gradually Appeared] [142:Gradually Appeared] Primary Etiology: [140:Lymphedema] [141:Lymphedema] [142:Lymphedema] Secondary Etiology: [140:N/A] [141:N/A] [142:N/A] Comorbid History: [140:Chronic sinus problems/congestion, Arrhythmia, Hypertension, Arrhythmia, Hypertension, Arrhythmia, Hypertension, Peripheral Arterial Disease, Peripheral Arterial Disease, Peripheral Arterial Disease, Type II Diabetes, History of  Type II Diabetes, History of Type II Diabetes, History of Burn, Gout, Confinement Burn, Gout, Confinement Burn, Gout, Confinement Anxiety] [141:Chronic sinus problems/congestion, Anxiety] [142:Chronic sinus problems/congestion, Anxiety] Date  Acquired: [140:09/07/2019] [141:09/07/2019] [142:09/12/2019] Weeks of Treatment: [140:1] [141:1] [142:0] Wound Status: [140:Open] [141:Open] [142:Open] Measurements L x W x D 0x0x0 [141:0x0x0] [142:1.5x1x0.1] (cm) Area (cm) : [140:0] [141:0] [142:1.178] Volume (cm) : [140:0] [141:0] [142:0.118] % Reduction in Area: [140:100.00%] [141:100.00%] [142:N/A] % Reduction  in Volume: 100.00% [141:100.00%] [142:N/A] Classification: [140:Full Thickness Without Exposed Support Structures Exposed Support Structures] [141:Full Thickness Without] [142:Partial Thickness] Exudate Amount: [140:Small] [141:Small] [142:Medium] Exudate Type: [140:Serosanguineous] [141:Serous] [142:Serous] Exudate Color: [140:red, brown] [141:amber] [142:amber] Wound Margin: [140:Flat and Intact] [141:Flat and Intact] [142:Flat and Intact] Granulation Amount: [140:None Present (0%)] [141:None Present (0%)] [142:Large (67-100%)] Granulation Quality: [140:N/A] [141:N/A] [142:Pink] Necrotic Amount: [140:None Present (0%)] [141:None Present (0%)] [142:None Present (0%)] Exposed Structures: [140:Fascia: No Fat Layer (Subcutaneous Fat Layer (Subcutaneous Fat Layer (Subcutaneous Tissue) Exposed: No Tendon: No Muscle: No Joint: No Bone: No] [141:Fascia: No Tissue) Exposed: No Tendon: No Muscle: No Joint: No Bone: No] [142:Fascia: No Tissue)  Exposed: No Tendon: No Muscle: No Joint: No Bone: No Limited to Skin Breakdown] Epithelialization: [140:Large (67-100%)] [141:Large (67-100%) 146 N/A] [142:Small (1-33%) N/A] Photos: [140:No Photos] [141:N/A] [142:N/A] Wound Location: [140:Left Toe Second] [141:N/A] [142:N/A] Wounding Event: [140:Gradually Appeared] [141:N/A] [142:N/A] Primary Etiology: [140:Lymphedema] [141:N/A] [142:N/A] 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] [142:N/A] Comorbid History: [140:Chronic sinus problems/congestion, Arrhythmia, Hypertension, Peripheral Arterial Disease, Type II Diabetes,  History of Burn, Gout, Confinement Anxiety] [141:N/A] [142:N/A] Date Acquired: [140:09/14/2019] [141:N/A] [142:N/A] Weeks of Treatment: [140:0] [141:N/A] [142:N/A] Wound Status: [140:Open] [141:N/A] [142:N/A] Measurements L x W x D 0.4x0.7x0.1 [141:N/A] [142:N/A] (cm) Area (cm) : [140:0.22] [141:N/A] [142:N/A] Volume (cm) : [140:0.022] [141:N/A] [142:N/A] % Reduction in Area: [140:N/A] [141:N/A] [142:N/A] % Reduction in Volume: N/A [141:N/A] [142:N/A] Classification: [140:Full Thickness Without Exposed Support Structures] [141:N/A] [142:N/A] Exudate Amount: [140:Medium] [141:N/A] [142:N/A] Exudate Type: [140:Serous] [141:N/A] [142:N/A] Exudate Color: [140:amber] [141:N/A] [142:N/A] Wound Margin: [140:Flat and Intact] [141:N/A] [142:N/A] Granulation Amount: [140:Large (67-100%)] [141:N/A] [142:N/A] Granulation Quality: [140:Red] [141:N/A] [142:N/A] Necrotic Amount: [140:None Present (0%)] [141:N/A] [142:N/A] Exposed Structures: [140:Fat Layer (Subcutaneous N/A Tissue) Exposed: Yes Fascia: No Tendon: No Muscle: No Joint: No Bone: No Medium (34-66%)] [141:N/A] [142:N/A N/A] Treatment Notes Electronic Signature(s) Signed: 09/14/2019 5:17:49 PM By: Linton Ham MD Signed: 10/12/2019 2:53:59 PM By: [name] RN Entered By: Linton Ham on 09/14/2019 11:37:46 -------------------------------------------------------------------------------- Multi-Disciplinary Care Plan Details Patient Name: Date of Service: Kevin Calico. 09/14/2019 10:00 AM Medical Record [id number] Patient Account Number: [id number] Date of Birth/Sex: Treating RN: Sep 28, 1951 (68 y.o. [name]) [name] Primary Care Sendy Pluta: [name] Other Clinician: Referring Jailyn Leeson: Treating Delailah Spieth/Extender:Robson, Peyton Najjar, [name] in Treatment: 190 Active Inactive Venous Leg Ulcer Nursing Diagnoses: Actual venous Insuffiency (use after diagnosis is confirmed) Goals: Patient will maintain  optimal edema control Date Initiated: 09/10/2016 Target Resolution Date: 10/01/2019 Goal Status: Active Verify adequate tissue perfusion prior to therapeutic compression application Date Initiated: 09/10/2016 Date Inactivated: 11/28/2016 Goal Status: Met Interventions: Assess peripheral edema status every visit. Compression as ordered Provide education on venous insufficiency Notes: edema not contolled above wraps, pt not using lymoh pumps regularly Wound/Skin Impairment Nursing Diagnoses: Impaired tissue integrity Goals: Patient/caregiver will verbalize understanding of skin care regimen Date Initiated: 09/10/2016 Target Resolution Date: 10/01/2019 Goal Status: Active Interventions: Assess patient/caregiver ability to perform ulcer/skin care regimen upon admission and as needed Assess ulceration(s) every visit Provide education on ulcer and skin care Notes: Electronic Signature(s) Signed: 10/12/2019 2:53:59 PM By: [name] RN Entered By: [name] on 09/14/2019 10:24:49 -------------------------------------------------------------------------------- Pain Assessment Details Patient Name: Date of Service: KAEVON, COTTA 09/14/2019 10:00 AM Medical Record [id number] Patient Account Number: [id number] Date of Birth/Sex: Treating RN: 1951/03/09 (68 y.o. [name] Primary Care Yusuke Beza: [name] Other Clinician: Referring Jaliel Deavers: Treating Elonzo Sopp/Extender:Robson, Peyton Najjar, [name] in Treatment: 190 Active Problems Location of Pain  Severity and Description of Pain Patient Has Paino No Site Locations Rate the pain. Current Pain Level: 0 Pain Management and Medication Current Pain Management: Electronic Signature(s) Signed: 09/14/2019 2:49:59 PM By: Baruch Gouty RN, BSN Entered By: Baruch Gouty on 09/14/2019 09:57:26 -------------------------------------------------------------------------------- Patient/Caregiver Education  Details Patient Name: Date of Service: Kevin Calico 11/10/2020andnbsp10:00 AM Medical Record [id number] Patient Account Number: [id number] Date of Birth/Gender: Treating RN: June 22, 1951 (68 y.o. [name]) [name] Primary Care Physician: [name] Other Clinician: Referring Physician: Treating Physician/Extender:Robson, Peyton Najjar, [name] in Treatment: 190 Education Assessment Education Provided To: Patient Education Topics Provided Venous: Methods: Explain/Verbal Responses: State content correctly Electronic Signature(s) Signed: 10/12/2019 2:53:59 PM By: [name] RN Entered By: [name] on 09/14/2019 10:25:09 -------------------------------------------------------------------------------- Wound Assessment Details Patient Name: Date of Service: DIAZ, CRAGO 09/14/2019 10:00 AM Medical Record [id number] Patient Account Number: [id number] Date of Birth/Sex: Treating RN: 01-Apr-1951 (68 y.o. [name] Primary Care Yaiza Palazzola: [name] Other Clinician: Referring Caroll Cunnington: Treating Jamiyla Ishee/Extender:Robson, Peyton Najjar, [name] in Treatment: 190 Wound Status Wound Number: 140 Primary Lymphedema Etiology: Wound Location: Left Lower Leg - Posterior, Circumferential Wound Open Status: Wounding Event: Blister Comorbid Chronic sinus problems/congestion, Date Acquired: 08/27/2019 History: Arrhythmia, Hypertension, Peripheral Arterial Weeks Of Treatment: 2 Disease, Type II Diabetes, History of Burn, Clustered Wound: No Gout, Confinement Anxiety Photos Wound Measurements Length: (cm) 0.5 % Reduc Width: (cm) 0.5 % Reduc Depth: (cm) 0.1 Epithel Area: (cm) 0.196 Tunnel Volume: (cm) 0.02 Underm Wound Description Classification: Full Thickness Without Exposed Support Foul O Structures Slough Wound Flat and Intact Margin: Exudate Medium Amount: Exudate Serosanguineous Type: Exudate red, brown red,  brown Color: Wound Bed Granulation Amount: Large (67-100%) Granulation Quality: Red Fascia Ex Necrotic Amount: None Present (0%) Fat Layer Tendon Ex Muscle Ex Joint Exp Bone Expo dor After Cleansing: No /Fibrino Yes Exposed Structure posed: No (Subcutaneous Tissue) Exposed: Yes posed: No posed: No osed: No sed: No tion in Area: 99.9% tion in Volume: 99.9% ialization: Medium (34-66%) ing: No ining: No Electronic Signature(s) Signed: 09/15/2019 4:27:20 PM By: Mikeal Hawthorne EMT/HBOT Signed: 09/15/2019 5:49:26 PM By: Baruch Gouty RN, BSN Previous Signature: 09/14/2019 2:49:59 PM Version By: Baruch Gouty RN, BSN Entered By: Mikeal Hawthorne on 09/15/2019 12:16:19 -------------------------------------------------------------------------------- Wound Assessment Details Patient Name: Date of Service: [name], [name] 09/14/2019 10:00 AM Medical Record [id number] Patient Account Number: [id number] Date of Birth/Sex: Treating RN: 1951/04/22 (68 y.o. [name] Primary Care Giannamarie Paulus: [name] Other Clinician: Referring Kamil Hanigan: Treating Whitten Andreoni/Extender:Robson, Peyton Najjar, [name] in Treatment: 190 Wound Status Wound Number: 141 Primary Lymphedema Etiology: Wound Location: Left Lower Leg - Medial Wound Healed - Epithelialized Wounding Event: Blister Status: Date Acquired: 09/07/2019 Comorbid Chronic sinus problems/congestion, Weeks Of Treatment: 1 History: Arrhythmia, Hypertension, Peripheral Arterial Clustered Wound: No Disease, Type II Diabetes, History of Burn, Gout, Confinement Anxiety Photos Wound Measurements Length: (cm) 0 % Reductio Width: (cm) 0 % Reductio Depth: (cm) 0 Epithelial Area: (cm) 0 Tunneling Volume: (cm) 0 Underm Wound Description Classification: Full Thickness Without Exposed Support Structures Wound Flat and Intact Margin: Exudate None Present Amount: Wound Bed Granulation Amount: None Present  (0%) Necrotic Amount: None Present (0%) Foul Odor After Cleansing: No Slough/Fibrino No Exposed Structure Fascia Exposed: No Fat Layer (Subcutaneous Tissue) Exposed: No Tendon Exposed: No Muscle Exposed: No Joint Exposed: No Bone Exposed: No n in Area: 100% n in Volume: 100% ization: Large (67-100%) : No ining: No Electronic Signature(s) Signed: 09/15/2019 4:27:20 PM By: Mikeal Hawthorne EMT/HBOT Signed: 09/15/2019  5:49:26 PM By: Baruch Gouty RN, BSN Previous Signature: 09/14/2019 2:49:59 PM Version By: Baruch Gouty RN, BSN Entered By: Mikeal Hawthorne on 09/15/2019 12:20:26 -------------------------------------------------------------------------------- Wound Assessment Details Patient Name: Date of Service: [name], [name] 09/14/2019 10:00 AM Medical Record [id number] Patient Account Number: [id number] Date of Birth/Sex: Treating RN: 07-10-1951 (68 y.o. [name] Primary Care Elivia Robotham: [name] Other Clinician: Referring Rhyse Loux: Treating Alexsander Cavins/Extender:Robson, Peyton Najjar, [name] in Treatment: 190 Wound Status Wound Number: [id number] Primary Lymphedema Etiology: Wound Location: Left Lower Leg - Anterior Wound Open Wounding Event: Blister Status: Date Acquired: 09/07/2019 Comorbid Chronic sinus problems/congestion, Weeks Of Treatment: 1 History: Arrhythmia, Hypertension, Peripheral Arterial Clustered Wound: No Disease, Type II Diabetes, History of Burn, Gout, Confinement Anxiety Photos Wound Measurements Length: (cm) 1.7 % Reduc Width: (cm) 1 % Reduc Depth: (cm) 0.1 Epithel Area: (cm) 1.335 Tunnel Volume: (cm) 0.134 Underm Wound Description Classification: Full Thickness Without Exposed Support Foul O Structures Slough Wound Flat and Intact Margin: Exudate Medium Amount: Exudate Serosanguineous Type: Exudate red, brown Color: Wound Bed Granulation Amount: Large (67-100%) Granulation Quality: Pink Fascia Necrotic  Amount: None Present (0%) Fat La Tendon Muscle Joint Bone E dor After Cleansing: No /Fibrino No Exposed Structure Exposed: No yer (Subcutaneous Tissue) Exposed: No Exposed: No Exposed: No Exposed: No xposed: No tion in Area: 41.2% tion in Volume: 41% ialization: Large (67-100%) ing: No ining: No Electronic Signature(s) Signed: 09/15/2019 4:27:20 PM By: Mikeal Hawthorne EMT/HBOT Signed: 09/15/2019 5:49:26 PM By: Baruch Gouty RN, BSN Previous Signature: 09/14/2019 2:49:59 PM Version By: Baruch Gouty RN, BSN Entered By: Mikeal Hawthorne on 09/15/2019 12:13:52 -------------------------------------------------------------------------------- Wound Assessment Details Patient Name: Date of Service: [name], [name] 09/14/2019 10:00 AM Medical Record [id number] Patient Account Number: [id number] Date of Birth/Sex: Treating RN: [birth year] (68 y.o. [name] Primary Care Diamond Jentz: [name] Other Clinician: Referring Donel Osowski: Treating Countess Biebel/Extender:Robson, Peyton Najjar, [name] in Treatment: 190 Wound Status Wound Number: [id number] Primary Lymphedema Etiology: Wound Location: Left Lower Leg - Lateral Wound Healed - Epithelialized Wounding Event: Gradually Appeared Status: Date Acquired: 09/07/2019 Comorbid Chronic sinus problems/congestion, Weeks Of Treatment: 1 History: Arrhythmia, Hypertension, Peripheral Arterial Clustered Wound: No Disease, Type II Diabetes, History of Burn, Gout, Confinement Anxiety Photos Wound Measurements Length: (cm) 0 % Reduct Width: (cm) 0 % Reduct Depth: (cm) 0 Epitheli Area: (cm) 0 Tunneli Volume: (cm) 0 Undermi Wound Description Classification: Full Thickness Without Exposed Support Foul Odo Structures Slough/F Wound Flat and Intact Margin: Exudate Small Amount: Exudate Serosanguineous Type: Exudate red, brown Color: Wound Bed Granulation Amount: None Present (0%) Necrotic Amount: None Present  (0%) Fascia E Fat Laye Tendon E Muscle E Joint Ex Bone Exp r After Cleansing: No ibrino No Exposed Structure xposed: No r (Subcutaneous Tissue) Exposed: No xposed: No xposed: No posed: No osed: No ion in Area: 100% ion in Volume: 100% alization: Large (67-100%) ng: No ning: No Electronic Signature(s) Signed: 09/15/2019 4:27:20 PM By: Mikeal Hawthorne EMT/HBOT Signed: 09/15/2019 5:49:26 PM By: Baruch Gouty RN, BSN Previous Signature: 09/14/2019 2:49:59 PM Version By: Baruch Gouty RN, BSN Entered By: Mikeal Hawthorne on 09/15/2019 12:15:42 -------------------------------------------------------------------------------- Wound Assessment Details Patient Name: Date of Service: Kevin Calico. 09/14/2019 10:00 AM Medical Record [id number] Patient Account Number: [id number] Date of Birth/Sex: Treating RN: [birth year] (68 y.o. [name] Primary Care Srija Southard: [name] Other Clinician: Referring Elbridge Magowan: Treating Kamoni Depree/Extender:Robson, Peyton Najjar, [name] in Treatment: 190 Wound Status Wound Number: [id number] Primary Lymphedema Etiology: Wound Location: Left Toe Fourth Wound Healed - Epithelialized  Wounding Event: Gradually Appeared Status: Date Acquired: 09/07/2019 Comorbid Chronic sinus problems/congestion, Weeks Of Treatment: 1 History: Arrhythmia, Hypertension, Peripheral Arterial Clustered Wound: No Disease, Type II Diabetes, History of Burn, Gout, Confinement Anxiety Photos Wound Measurements Length: (cm) 0 % Reduc Width: (cm) 0 % Reduc Depth: (cm) 0 Epithel Area: (cm) 0 Tunnel Volume: (cm) 0 Underm Wound Description Classification: Full Thickness Without Exposed Support Foul O Structures Slough Wound Flat and Intact Margin: Exudate Small Amount: Exudate Serous Type: Exudate amber Color: Wound Bed Granulation Amount: None Present (0%) Necrotic Amount: None Present (0%) Fascia Fat La Tendon Muscle Joint Exp Bone  Expo dor After Cleansing: No /Fibrino No Exposed Structure Exposed: No yer (Subcutaneous Tissue) Exposed: No Exposed: No Exposed: No osed: No sed: No tion in Area: 100% tion in Volume: 100% ialization: Large (67-100%) ing: No ining: No Electronic Signature(s) Signed: 09/15/2019 4:27:20 PM By: Mikeal Hawthorne EMT/HBOT Signed: 09/15/2019 5:49:26 PM By: Baruch Gouty RN, BSN Previous Signature: 09/14/2019 2:49:59 PM Version By: Baruch Gouty RN, BSN Entered By: Mikeal Hawthorne on 09/15/2019 12:14:44 -------------------------------------------------------------------------------- Wound Assessment Details Patient Name: Date of Service: Kevin Calico. 09/14/2019 10:00 AM Medical Record [id number] Patient Account Number: [id number] Date of Birth/Sex: Treating RN: [birth year] (68 y.o. [name] Primary Care Nikita Surman: [name] Other Clinician: Referring Aisling Emigh: Treating Alic Hilburn/Extender:Robson, Peyton Najjar, [name] in Treatment: 190 Wound Status Wound Number: [id number] Primary Lymphedema Etiology: Wound Location: Right Upper Leg - Medial Wound Open Wounding Event: Gradually Appeared Status: Date Acquired: 09/12/2019 Comorbid Chronic sinus problems/congestion, Weeks Of Treatment: 0 History: Arrhythmia, Hypertension, Peripheral Arterial Clustered Wound: No Disease, Type II Diabetes, History of Burn, Gout, Confinement Anxiety Photos Wound Measurements Length: (cm) 1.5 Width: (cm) 1 Depth: (cm) 0.1 Area: (cm) 1.178 Volume: (cm) 0.118 Wound Description Classification: Partial Thickness Wound Margin: Flat and Intact Exudate Amount: Medium Exudate Type: Serous Exudate Color: amber Wound Bed Granulation Amount: Large (67-100%) Granulation Quality: Pink Necrotic Amount: None Present (0%) After Cleansing: No brino No Exposed Structure posed: No (Subcutaneous Tissue) Exposed: No posed: No posed: No osed: No sed: No o Skin Breakdown %  Reduction in Area: 0% % Reduction in Volume: 0% Epithelialization: Small (1-33%) Tunneling: No Undermining: No Foul Odor Slough/Fi Fascia Ex Fat Layer Tendon Ex Muscle Ex Joint Exp Bone Expo Limited t Electronic Signature(s) Signed: 09/15/2019 4:27:20 PM By: Mikeal Hawthorne EMT/HBOT Signed: 09/15/2019 5:49:26 PM By: Baruch Gouty RN, BSN Previous Signature: 09/14/2019 2:49:59 PM Version By: Baruch Gouty RN, BSN Entered By: Mikeal Hawthorne on 09/15/2019 12:18:42 -------------------------------------------------------------------------------- Wound Assessment Details Patient Name: Date of Service: [name], [name] 09/14/2019 10:00 AM Medical Record [id number] Patient Account Number: [id number] Date of Birth/Sex: Treating RN: 10-14-1951 (67 y.o. [name] Primary Care Lauralee Waters: [name] Other Clinician: Referring Chaniya Genter: Treating Malaiya Paczkowski/Extender:Robson, Peyton Najjar, [name] in Treatment: 190 Wound Status Wound Number: 146 Primary Lymphedema Etiology: Wound Location: Left Toe Second Secondary Diabetic Wound/Ulcer of the Lower Extremity Wounding Event: Gradually Appeared Etiology: Date Acquired: 09/14/2019 Wound Open Weeks Of Treatment: 0 Status: Clustered Wound: No Comorbid Chronic sinus problems/congestion, History: Arrhythmia, Hypertension, Peripheral Arterial Disease, Type II Diabetes, History of Burn, Gout, Confinement Anxiety Photos Wound Measurements Length: (cm) 0.4 % Reductio Width: (cm) 0.7 % Reductio Depth: (cm) 0.1 Epithelial Area: (cm) 0.22 Tunnel Volume: (cm) 0.022 Underm Wound Description Classification: Full Thickness Without Exposed Support Structures Wound Flat and Intact Margin: Exudate Medium Amount: Exudate Serous Type: Exudate amber Color: Wound Bed Granulation Amount: Large (67-100%) Granulation Quality: Red Necrotic Amount: None Present (  0%) Foul Odor After Cleansing: No Slough/Fibrino  No Exposed Structure Fascia Exposed: No Fat Layer (Subcutaneous Tissue) Exposed: Yes Tendon Exposed: No Muscle Exposed: No Joint Exposed: No Bone Exposed: No n in Area: 0% n in Volume: 0% ization: Medium (34-66%) ing: No ining: No Electronic Signature(s) Signed: 09/15/2019 4:27:20 PM By: Mikeal Hawthorne EMT/HBOT Signed: 09/15/2019 5:49:26 PM By: Baruch Gouty RN, BSN Previous Signature: 09/14/2019 2:49:59 PM Version By: Baruch Gouty RN, BSN Entered By: Mikeal Hawthorne on 09/15/2019 12:19:32 -------------------------------------------------------------------------------- [place] Details Patient Name: Date of Service: Kevin Calico. 09/14/2019 10:00 AM Medical Record [id number] Patient Account Number: [id number] Date of Birth/Sex: Treating RN: Mar 01, 1951 (68 y.o. [name] Primary Care Xiong Haidar: [name] Other Clinician: Referring Mata Rowen: Treating Alvia Tory/Extender:Robson, Peyton Najjar, [name] in Treatment: 190 Vital Signs Time Taken: 09:56 Temperature (F): 98.3 Height (in): 70 Pulse (bpm): 46 Source: Stated Respiratory Rate (breaths/min): 20 Weight (lbs): 380.2 Blood Pressure (mmHg): 164/53 Source: Stated Capillary Blood Glucose (mg/dl): 154 Body Mass Index (BMI): 54.5 Reference Range: 80 - 120 mg / dl Notes glucose per pt report this am Electronic Signature(s) Signed: 09/14/2019 2:49:59 PM By: Baruch Gouty RN, BSN Entered By: Baruch Gouty on 09/14/2019 09:56:54

## 2019-10-12 NOTE — Progress Notes (Signed)
[name], [name] ([id number]) Visit Report for 08/23/2019 Arrival Information Details Patient Name: Date of Service: [name], [name] 08/23/2019 3:00 PM Medical Record [id number] Patient Account Number: [id number] Date of Birth/Sex: Treating RN: 1951/03/18 (68 y.o. Judie Petit) Yevonne Pax Primary Care Nakima Fluegge: Nicoletta Ba Other Clinician: Referring Tashunda Vandezande: Treating Klein Willcox/Extender:Robson, Lottie Rater, [name] in Treatment: 187 Visit Information History Since Last Visit Walker All ordered tests and consults were completed: No Patient Arrived: Added or deleted any medications: No Arrival Time: 16:29 Any new allergies or adverse reactions: No Accompanied By: self Had a fall or experienced change in No Transfer Assistance: None activities of daily living that may affect Patient Identification Verified: Yes risk of falls: Secondary Verification Process Completed: Yes Signs or symptoms of abuse/neglect since last No Patient Requires Transmission-Based No visito Precautions: Hospitalized since last visit: No Patient Has Alerts: Yes Implantable device outside of the clinic excluding No cellular tissue based products placed in the center since last visit: Has Dressing in Place as Prescribed: Yes Has Compression in Place as Prescribed: Yes Pain Present Now: No Electronic Signature(s) Signed: 10/12/2019 3:01:15 PM By: Yevonne Pax RN Entered By: Yevonne Pax on 08/23/2019 16:30:35 -------------------------------------------------------------------------------- Compression Therapy Details Patient Name: Date of Service: [name] 08/23/2019 3:00 PM Medical Record [id number] Patient Account Number: [id number] Date of Birth/Sex: Treating RN: 1951/08/28 (68 y.o. Judie Petit) Yevonne Pax Primary Care Keiondra Brookover: Nicoletta Ba Other Clinician: Referring Leland Staszewski: Treating Jerrie Schussler/Extender:Robson, Lottie Rater, [name] in Treatment: 187 Compression Therapy  Performed for Wound Wound #139 Left,Anterior Upper Leg Assessment: Performed By: Little Ishikawa, RN Compression Type: Four Layer Electronic Signature(s) Signed: 10/12/2019 3:01:15 PM By: Yevonne Pax RN Entered By: Yevonne Pax on 08/23/2019 17:05:14 -------------------------------------------------------------------------------- Encounter Discharge Information Details Patient Name: Date of Service: [name] 08/23/2019 3:00 PM Medical Record [id number] Patient Account Number: [id number] Date of Birth/Sex: Treating RN: December 12, 1950 (68 y.o. Judie Petit) Yevonne Pax Primary Care Evoleht Hovatter: Nicoletta Ba Other Clinician: Referring Mickenzie Stolar: Treating Mishaal Lansdale/Extender:Robson, Lottie Rater, [name] in Treatment: 187 Encounter Discharge Information Items Discharge Condition: Stable Ambulatory Status: Walker Discharge Destination: Home Transportation: Private Auto Accompanied By: self Schedule Follow-up Appointment: Yes Clinical Summary of Care: Patient Declined Electronic Signature(s) Signed: 10/12/2019 3:01:15 PM By: Yevonne Pax RN Entered By: Yevonne Pax on 08/23/2019 17:07:14 -------------------------------------------------------------------------------- Patient/Caregiver Education Details Patient Name: Date of Service: [name] 10/19/2020andnbsp3:00 PM Medical Record [id number] Patient Account Number: [id number] Date of Birth/Gender: Treating RN: 10/02/1951 (68 y.o. Judie Petit) Yevonne Pax Primary Care Physician: Nicoletta Ba Other Clinician: Referring Physician: Treating Physician/Extender:Robson, Lottie Rater, [name] in Treatment: 187 Education Assessment Education Provided To: Patient Education Topics Provided Wound/Skin Impairment: Methods: Explain/Verbal Responses: State content correctly Electronic Signature(s) Signed: 10/12/2019 3:01:15 PM By: Yevonne Pax RN Entered By: Yevonne Pax on 08/23/2019  17:06:57 -------------------------------------------------------------------------------- Wound Assessment Details Patient Name: Date of Service: [name], [name] 08/23/2019 3:00 PM Medical Record [id number] Patient Account Number: [id number] Date of Birth/Sex: Treating RN: 07/20/1951 (68 y.o. Judie Petit) Yevonne Pax Primary Care Kervens Roper: Nicoletta Ba Other Clinician: Referring Zackary Mckeone: Treating Daine Croker/Extender:Robson, Lottie Rater, [name] in Treatment: 187 Wound Status Wound Number: 137 Primary Lymphedema Etiology: Wound Location: Right Lower Leg - Medial, Proximal Wound Open Status: Wounding Event: Blister Comorbid Chronic sinus problems/congestion, Date Acquired: 08/01/2019 History: Arrhythmia, Hypertension, Peripheral Arterial Weeks Of Treatment: 2 Disease, Type II Diabetes, History of Burn, Clustered Wound: No Gout, Confinement Anxiety Wound Measurements Length: (cm) 0.5 % Reduc Width: (cm) 0.5 % Reduc Depth: (cm) 0.1 Epithel Area: (cm) 0.196 Tunnel Volume: (cm) 0.02 Underm  Wound Description Classification: Full Thickness Without Exposed Support Foul O Structures Slough Exudate Medium Amount: Exudate Serosanguineous Type: Exudate red, brown Color: Wound Bed Granulation Amount: Small (1-33%) Granulation Quality: Pink Fascia Necrotic Amount: Large (67-100%) Fat Lay Necrotic Quality: Adherent Slough Tendon Muscle Joint E Bone Ex Electronic Signature(s) Signed: 10/12/2019 3:01:15 PM By: Yevonne PaxEpps, Carrie RN Entered By: Yevonne PaxEpps, Carrie on 10/19/2 dor After Cleansing: No Lelon Perla/Fibrino Yes Exposed Structure Exposed: No er (Subcutaneous Tissue) Exposed: Yes Exposed: No Exposed: No xposed: No posed: No 020 16:32:13 tion in Area: 99.8% tion in Volume: 99.8% ialization: None ing: No ining: No -------------------------------------------------------------------------------- Wound Assessment Details Patient Name: Date of Service: [name], [name]  08/23/2019 3:00 PM Medical Record [id number] Patient Account Number: [id number] Date of Birth/Sex: Treating RN: 06/12/1951 (68 y.o. Judie PetitM) Yevonne PaxEpps, Carrie Primary Care Lorrie Gargan: Nicoletta BaMCGOWEN, [name] Other Clinician: Referring Abria Vannostrand: Treating Iesha Summerhill/Extender:Robson, Lottie RaterMichael MCGOWEN, [name] in Treatment: 187 Wound Status Wound Number: 138 Primary Diabetic Wound/Ulcer of the Lower Extremity Etiology: Wound Location: Right Toe Second - Anterior Wound Open Wounding Event: Gradually Appeared Status: Date Acquired: 08/08/2019 Comorbid Chronic sinus problems/congestion, Weeks Of Treatment: 1 History: Arrhythmia, Hypertension, Peripheral Arterial Clustered Wound: Yes Disease, Type II Diabetes, History of Burn, Gout, Confinement Anxiety Wound Measurements Length: (cm) 4.8 Width: (cm) 5.5 Depth: (cm) 0.1 Area: (cm) 20.735 Volume: (cm) 2.073 Wound Description Classification: Grade 2 Exudate Amount: Small Exudate Type: Serosanguineous Exudate Color: red, brown Wound Bed Granulation Amount: Medium (34-66%) Granulation Quality: Pink Necrotic Amount: Medium (34-66%) Necrotic Quality: Adherent Slough After Cleansing: No rino Yes Exposed Structure sed: No Subcutaneous Tissue) Exposed: Yes sed: No sed: No ed: No d: No % Reduction in Area: -2299.9% % Reduction in Volume: -2310.5% Epithelialization: None Tunneling: No Undermining: No Foul Odor Slough/Fib Fascia Expo Fat Layer ( Tendon Expo Muscle Expo Joint Expos Bone Expose Electronic Signature(s) Signed: 10/12/2019 3:01:15 PM By: Yevonne PaxEpps, Carrie RN Entered By: Yevonne PaxEpps, Carrie on 08/23/2019 16:32:40 -------------------------------------------------------------------------------- Wound Assessment Details Patient Name: Date of Service: [name], [name] 08/23/2019 3:00 PM Medical Record [id number] Patient Account Number: [id number] Date of Birth/Sex: Treating RN: 02/12/1951 (68 y.o. Judie PetitM) Yevonne PaxEpps, Carrie Primary Care  Zina Pitzer: Nicoletta BaMCGOWEN, [name] Other Clinician: Referring Gavinn Collard: Treating Constanza Mincy/Extender:Robson, Lottie RaterMichael MCGOWEN, [name] in Treatment: 187 Wound Status Wound Number: 139 Primary Lymphedema Etiology: Wound Location: Left Upper Leg - Anterior Wound Open Wounding Event: Blister Status: Date Acquired: 08/13/2019 Comorbid Chronic sinus problems/congestion, Weeks Of Treatment: 0 History: Arrhythmia, Hypertension, Peripheral Arterial Clustered Wound: No Disease, Type II Diabetes, History of Burn, Gout, Confinement Anxiety Wound Measurements Length: (cm) 0.5 % Reduc Width: (cm) 0.5 % Reduc Depth: (cm) 0.1 Epithel Area: (cm) 0.196 Tunnel Volume: (cm) 0.02 Underm Wound Description Classification: Full Thickness Without Exposed Support Structures Exudate Large Amount: Exudate Serosanguineous Type: Exudate red, brown Color: Wound Bed Granulation Amount: Large (67-100%) Granulation Quality: Red Foul Odor After Cleansing: No Slough/Fibrino No Exposed Structure Fascia Exposed: No Fat Layer (Subcutaneous Tissue) Exposed: Yes Tendon Exposed: No Muscle Exposed: No Joint Exposed: No Bone Exposed: No tion in Area: 0% tion in Volume: 0% ialization: None ing: No ining: No Electronic Signature(s) Signed: 10/12/2019 3:01:15 PM By: Yevonne PaxEpps, Carrie RN Entered By: Yevonne PaxEpps, Carrie on 08/23/2019 16:33:19 -------------------------------------------------------------------------------- Vit[place] Details Patient Name: Date of Service: [name], [name] 08/23/2019 3:00 PM Medical Record [id number] Patient Account Number: [id number] Date of Birth/Sex: Treating RN: 05/20/1951 (68 y.o. Melonie FloridaM) Epps, Carrie Primary Care Timira Bieda: Nicoletta BaMCGOWEN, [name] Other Clinician: Referring Krystianna Soth: Treating Shadrick Senne/Extender:Robson, Lottie RaterMichael MCGOWEN, [name] in Treatment: 187 Vital Signs Time Taken: 16:30  Temperature (F): 97.7 Height (in): 70 Pulse (bpm): 71 Weight (lbs): 380.2 Respiratory  Rate (breaths/min): 18 Body Mass Index (BMI): 54.5 Blood Pressure (mmHg): 183/98 Reference Range: 80 - 120 mg / dl Electronic Signature(s) Signed: 10/12/2019 3:01:15 PM By: [name] RN Entered By: [name] on 08/23/2019 16:31:01

## 2019-10-12 NOTE — Progress Notes (Signed)
[name], [name] ([id number]) Visit Report for 10/06/2019 Arrival Information Details Patient Name: Date of Service: [name], [name] 10/06/2019 11:00 AM Medical Record Number:[id number] Patient Account Number: [id number] Date of Birth/Sex: Treating RN: 05/05/1951 (68 y.o. M) Epps, Carrie Primary Care Provider: MCGOWEN, [name] Other Clinician: Referring Provider: Treating Provider/Extender:[name], Hoyt MCGOWEN, [name] in Treatment: 193 Visit Information History Since Last Visit Walker All ordered tests and consults were completed: No Patient Arrived: Added or deleted any medications: No Arrival Time: 12:04 Any new allergies or adverse reactions: No Accompanied By: self Had a fall or experienced change in No Transfer Assistance: None activities of daily living that may affect Patient Identification Verified: Yes risk of falls: Secondary Verification Process Completed: Yes Signs or symptoms of abuse/neglect since last No Patient Requires Transmission-Based No visito Precautions: Hospitalized since last visit: No Patient Has Alerts: Yes Implantable device outside of the clinic excluding No cellular tissue based products placed in the center since last visit: Has Dressing in Place as Prescribed: Yes Has Compression in Place as Prescribed: Yes Pain Present Now: No Electronic Signature(s) Signed: 10/12/2019 2:52:36 PM By: Epps, Carrie RN Entered By: Epps, Carrie on 10/06/2019 12:04:40 -------------------------------------------------------------------------------- Compression Therapy Details Patient Name: Date of Service: [name], [name] 10/06/2019 11:00 AM Medical Record Number:[id number] Patient Account Number: [id number] Date of Birth/Sex: Treating RN: 07/07/1951 (68 y.o. M) Boehlein, Linda Primary Care Provider: MCGOWEN, [name] Other Clinician: Referring Provider: Treating Provider/Extender:[name], Hoyt MCGOWEN, [name] in Treatment: 193 Compression Therapy  Performed for Wound NonWound Condition Lymphedema - Right Leg Assessment: Performed By: Clinician Epps, Carrie, RN Compression Type: Unna Boot Post Procedure Diagnosis Same as Pre-procedure Electronic Signature(s) Signed: 10/06/2019 6:22:02 PM By: Boehlein, Linda RN, BSN Entered By: Boehlein, Linda on 10/06/2019 12:30:24 -------------------------------------------------------------------------------- Compression Therapy Details Patient Name: Date of Service: [name], [name] 10/06/2019 11:00 AM Medical Record Number:[id number] Patient Account Number: [id number] Date of Birth/Sex: Treating RN: 02/15/1951 (68 y.o. M) Boehlein, Linda Primary Care Provider: MCGOWEN, [name] Other Clinician: Referring Provider: Treating Provider/Extender:[name], Hoyt MCGOWEN, [name] in Treatment: 193 Compression Therapy Performed for Wound Wound #147 Left,Circumferential Lower Leg Assessment: Performed By: Clinician Epps, Carrie, RN Compression Type: Four Layer Post Procedure Diagnosis Same as Pre-procedure Electronic Signature(s) Signed: 10/06/2019 6:22:02 PM By: Boehlein, Linda RN, BSN Entered By: Boehlein, Linda on 10/06/2019 12:30:51 -------------------------------------------------------------------------------- Encounter Discharge Information Details Patient Name: Date of Service: [name], [name] 10/06/2019 11:00 AM Medical Record Number:[id number] Patient Account Number: [id number] Date of Birth/Sex: Treating RN: 12/02/1950 (68 y.o. M) Epps, Carrie Primary Care Provider: MCGOWEN, [name] Other Clinician: Referring Provider: Treating Provider/Extender:[name], Hoyt MCGOWEN, [name] in Treatment: 193 Encounter Discharge Information Items Discharge Condition: Stable Ambulatory Status: Unstable Discharge Destination: Home Transportation: Private Auto Accompanied By: self Schedule Follow-up Appointment: Yes Clinical Summary of Care: Patient Declined Electronic  Signature(s) Signed: 10/12/2019 2:52:36 PM By: Epps, Carrie RN Entered By: Epps, Carrie on 10/06/2019 12:48:04 -------------------------------------------------------------------------------- Lower Extremity Assessment Details Patient Name: Date of Service: [name], [name] 10/06/2019 11:00 AM Medical Record Number:[id number] Patient Account Number: [id number] Date of Birth/Sex: Treating RN: 11/15/1950 (68 y.o. M) Epps, Carrie Primary Care Provider: MCGOWEN, [name] Other Clinician: Referring Provider: Treating Provider/Extender:[name], Hoyt MCGOWEN, [name] in Treatment: 193 Edema Assessment Assessed: [Left: No] [Right: No] Edema: [Left: Ye] [Right: s] Calf Left: Right: Point of Measurement: 31 cm From Medial Instep 52 cm cm Ankle Left: Right: Point of Measurement: 12 cm From Medial Instep 24.5 cm cm Electronic Signature(s) Signed: 10/12/2019 2:52:36 PM By: Epps, Carrie   RN Entered By: Epps, Carrie on 10/06/2019 12:05:23 -------------------------------------------------------------------------------- Multi-Disciplinary Care Plan Details Patient Name: Date of Service: [name], [name] 10/06/2019 11:00 AM Medical Record Number:[id number] Patient Account Number: [id number] Date of Birth/Sex: Treating RN: 01/21/1951 (68 y.o. M) Boehlein, Linda Primary Care Marka Treloar: MCGOWEN, [name] Other Clinician: Referring Canisha Issac: Treating Fontella Shan/Extender:[name], Hoyt MCGOWEN, [name] in Treatment: 193 Active Inactive Venous Leg Ulcer Nursing Diagnoses: Actual venous Insuffiency (use after diagnosis is confirmed) Goals: Patient will maintain optimal edema control Date Initiated: 09/10/2016 Target Resolution Date: 11/05/2019 Goal Status: Active Verify adequate tissue perfusion prior to therapeutic compression application Date Initiated: 09/10/2016 Date Inactivated: 11/28/2016 Goal Status: Met Interventions: Assess peripheral edema status every visit. Compression as  ordered Provide education on venous insufficiency Notes: edema not contolled above wraps, pt not using lymoh pumps regularly Wound/Skin Impairment Nursing Diagnoses: Impaired tissue integrity Goals: Patient/caregiver will verbalize understanding of skin care regimen Date Initiated: 09/10/2016 Target Resolution Date: 11/05/2019 Goal Status: Active Interventions: Assess patient/caregiver ability to perform ulcer/skin care regimen upon admission and as needed Assess ulceration(s) every visit Provide education on ulcer and skin care Notes: Electronic Signature(s) Signed: 10/06/2019 6:22:02 PM By: Boehlein, Linda RN, BSN Entered By: Boehlein, Linda on 10/06/2019 11:56:20 -------------------------------------------------------------------------------- Pain Assessment Details Patient Name: Date of Service: [name], [name] 10/06/2019 11:00 AM Medical Record Number:[id number] Patient Account Number: [id number] Date of Birth/Sex: Treating RN: 12/27/1950 (68 y.o. M) Epps, Carrie Primary Care Jeneane Pieczynski: MCGOWEN, [name] Other Clinician: Referring Treyvone Chelf: Treating Escarlet Saathoff/Extender:[name], Hoyt MCGOWEN, [name] in Treatment: 193 Active Problems Location of Pain Severity and Description of Pain Patient Has Paino No Site Locations Pain Management and Medication Current Pain Management: Electronic Signature(s) Signed: 10/12/2019 2:52:36 PM By: Epps, Carrie RN Entered By: Epps, Carrie on 10/06/2019 12:05:18 -------------------------------------------------------------------------------- Patient/Caregiver Education Details Patient Name: Date of Service: [name], [name] 12/2/2020andnbsp11:00 AM Medical Record Number:[id number] Patient Account Number: [id number] Date of Birth/Gender: Treating RN: 05/10/1951 (68 y.o. M) Boehlein, Linda Primary Care Physician: MCGOWEN, [name] Other Clinician: Referring Physician: Treating Physician/Extender:[name], Hoyt MCGOWEN, [name] in  Treatment: 193 Education Assessment Education Provided To: Patient Education Topics Provided Venous: Methods: Explain/Verbal Responses: Reinforcements needed, State content correctly Wound/Skin Impairment: Methods: Explain/Verbal Responses: Reinforcements needed, State content correctly Electronic Signature(s) Signed: 10/06/2019 6:22:02 PM By: Boehlein, Linda RN, BSN Entered By: Boehlein, Linda on 10/06/2019 11:57:13 -------------------------------------------------------------------------------- Wound Assessment Details Patient Name: Date of Service: [name], [name] 10/06/2019 11:00 AM Medical Record Number:[id number] Patient Account Number: [id number] Date of Birth/Sex: Treating RN: 09/05/1951 (68 y.o. M) Epps, Carrie Primary Care Atilla Zollner: MCGOWEN, [name] Other Clinician: Referring Shanica Castellanos: Treating Danyael Alipio/Extender:[name], Hoyt MCGOWEN, [name] in Treatment: 193 Wound Status Wound Number: [id number] Primary Lymphedema Etiology: Wound Location: Right Upper Leg - Medial Wound Open Wounding Event: Gradually Appeared Status: Date Acquired: 09/12/2019 Comorbid Chronic sinus problems/congestion, Weeks Of Treatment: 3 History: Arrhythmia, Hypertension, Peripheral Arterial Clustered Wound: No Disease, Type II Diabetes, History of Burn, Gout, Confinement Anxiety Photos Wound Measurements Length: (cm) 3 Width: (cm) 4.5 Depth: (cm) 0.1 Area: (cm) 10.603 Volume: (cm) 1.06 Wound Description Classification: Partial Thickness Wound Margin: Flat and Intact Exudate Amount: None Present Wound Bed Granulation Amount: None Present (0%) Necrotic Amount: Large (67-100%) Necrotic Quality: Eschar Treatment Notes Wound #145 (Right, Medial Upper Leg) 1. Cleanse With Wound Cleanser Soap and water 3. Primary Dressing Applied Calcium Alginate Ag 4. Secondary Dressing Dry Gauze 5. Secured With Tape fter Cleansing: No ino Yes Exposed Structure sed: No Subcutaneous Tissue)  Exposed: No sed: No sed: No ed: No   d: No % Reduction in Area: -800.1% % Reduction in Volume: -798.3% Epithelialization: Large (67-100%) Tunneling: No Undermining: No Foul Odor A Slough/Fibr Fascia Expo Fat Layer ( Tendon Expo Muscle Expo Joint Expos Bone Expose Electronic Signature(s) Signed: 10/08/2019 3:46:47 PM By: Jones, Dedrick EMT/HBOT Signed: 10/12/2019 2:52:36 PM By: Epps, Carrie RN Entered By: Jones, Dedrick on 10/08/2019 14:09:29 -------------------------------------------------------------------------------- Wound Assessment Details Patient Name: Date of Service: [name], [name] 10/06/2019 11:00 AM Medical Record Number:[id number] Patient Account Number: [id number] Date of Birth/Sex: Treating RN: 12/23/1950 (68 y.o. M) Epps, Carrie Primary Care Provider: MCGOWEN, [name] Other Clinician: Referring Provider: Treating Provider/Extender:[name], Hoyt MCGOWEN, [name] in Treatment: 193 Wound Status Wound Number: [id number] Primary Lymphedema Etiology: Wound Location: Left Toe Second Secondary Diabetic Wound/Ulcer of the Lower Wounding Event: Gradually Appeared Etiology: Extremity Date Acquired: 09/14/2019 Wound Open Weeks Of Treatment: 3 Status: Clustered Wound: No Comorbid Chronic sinus problems/congestion, History: Arrhythmia, Hypertension, Peripheral Arterial Disease, Type II Diabetes, History of Burn, Gout, Confinement Anxiety Photos Wound Measurements Length: (cm) 1 % Reduction i Width: (cm) 9 % Reduc Depth: (cm) 0.2 Epithel Area: (cm) 7.069 Tunnel Volume: (cm) 1.414 Underm Wound Description Classification: Full Thickness Without Exposed Support Foul O Structures Slough Wound Flat and Intact Margin: Exudate Medium Amount: Exudate Serous Type: Exudate amber Color: Wound Bed Granulation Amount: Large (67-100%) Granulation Quality: Red Fascia Necrotic Amount: None Present (0%) Fat Lay Tendon Muscle Joint E Bone Ex dor After  Cleansing: No /Fibrino No Exposed Structure Exposed: No er (Subcutaneous Tissue) Exposed: Yes Exposed: No Exposed: No xposed: No posed: No n Area: -3113.2% tion in Volume: -6327.3% ialization: Small (1-33%) ing: No ining: No Assessment Notes MASCERATED Treatment Notes Wound #146 (Left Toe Second) 1. Cleanse With Wound Cleanser Soap and water 3. Primary Dressing Applied Calcium Alginate Ag 4. Secondary Dressing Dry Gauze 5. Secured With Tape Electronic Signature(s) Signed: 10/08/2019 3:46:47 PM By: Jones, Dedrick EMT/HBOT Signed: 10/12/2019 2:52:36 PM By: Epps, Carrie RN Entered By: Jones, Dedrick on 10/08/2019 14:11:58 -------------------------------------------------------------------------------- Wound Assessment Details Patient Name: Date of Service: [name], [name] 10/06/2019 11:00 AM Medical Record Number:[id number] Patient Account Number: [id number] Date of Birth/Sex: Treating RN: 07/18/1951 (68 y.o. M) Epps, Carrie Primary Care Provider: MCGOWEN, [name] Other Clinician: Referring Provider: Treating Provider/Extender:[name], Hoyt MCGOWEN, [name] in Treatment: 193 Wound Status Wound Number: 147 Primary Lymphedema Etiology: Wound Location: Left Lower Leg - Circumferential Wound Open Wounding Event: Blister Status: Date Acquired: 09/30/2019 Comorbid Chronic sinus problems/congestion, Weeks Of Treatment: 0 History: Arrhythmia, Hypertension, Peripheral Arterial Clustered Wound: No Disease, Type II Diabetes, History of Burn, Gout, Confinement Anxiety Photos Wound Measurements Length: (cm) 3.4 % Reduc Width: (cm) 46 % Reduc Depth: (cm) 0.1 Epithel Area: (cm) 122.836 Tunnel Volume: (cm) 12.284 Underm Wound Description Classification: Full Thickness Without Exposed Support Structures Wound Distinct, outline attached Margin: Exudate Large Amount: Exudate Serous Type: Exudate amber Color: Wound Bed Granulation Amount: Large  (67-100%) Granulation Quality: Red, Pink Necrotic Amount: Small (1-33%) Necrotic Quality: Adherent Slough Foul Odor After Cleansing: No Slough/Fibrino Yes Exposed Structure Fascia Exposed: No Fat Layer (Subcutaneous Tissue) Exposed: Yes Tendon Exposed: No Muscle Exposed: No Joint Exposed: No Bone Exposed: No tion in Area: 51.4% tion in Volume: 51.4% ialization: None ing: No ining: No Treatment Notes Wound #147 (Left, Circumferential Lower Leg) 1. Cleanse With Saline Soap and water 3. Primary Dressing Applied Calcium Alginate Ag 4. Secondary Dressing ABD Pad 6. Support Layer Applied 4 layer compression wrap Notes unna boot upper portion of lower leg. stockinette. Electronic   Signature(s) Signed: 10/08/2019 3:46:47 PM By: Jones, Dedrick EMT/HBOT Signed: 10/12/2019 2:52:36 PM By: Epps, Carrie RN Entered By: Jones, Dedrick on 10/08/2019 14:13:20 -------------------------------------------------------------------------------- Wound Assessment Details Patient Name: Date of Service: [name], [name] 10/06/2019 11:00 AM Medical Record Number:[id number] Patient Account Number: [id number] Date of Birth/Sex: Treating RN: 08/29/1951 (68 y.o. M) Epps, Carrie Primary Care Provider: MCGOWEN, [name] Other Clinician: Referring Provider: Treating Provider/Extender:[name], Hoyt MCGOWEN, [name] in Treatment: 193 Wound Status Wound Number: 148 Primary Lymphedema Etiology: Wound Location: Right Toe Second Wound Open Wounding Event: Blister Status: Date Acquired: 10/04/2019 Comorbid Chronic sinus problems/congestion, Weeks Of Treatment: 0 History: Arrhythmia, Hypertension, Peripheral Arterial Clustered Wound: No Disease, Type II Diabetes, History of Burn, Gout, Confinement Anxiety Photos Wound Measurements Length: (cm) 0.3 % Reduc Width: (cm) 0.5 % Reduc Depth: (cm) 0.1 Tunneli Area: (cm) 0.118 Underm Volume: (cm) 0.012 Wound Description Classification: Full  Thickness Without Exposed Support Foul O Structures Slough Exudate Exudate Medium Amount: Exudate Serosanguineous Type: Exudate red, brown Color: Wound Bed Granulation Amount: None Present (0%) Necrotic Amount: Large (67-100%) Fascia Expo Necrotic Quality: Adherent Slough Fat Layer ( Tendon Expo Muscle Expo Joint Expos Bone Expose dor After Cleansing: No /Fibrino Yes Exposed Structure sed: No Subcutaneous Tissue) Exposed: No sed: No sed: No ed: No d: No tion in Area: 0% tion in Volume: 0% ng: No ining: No Treatment Notes Wound #148 (Right Toe Second) 1. Cleanse With Wound Cleanser Soap and water 3. Primary Dressing Applied Calcium Alginate Ag 4. Secondary Dressing Dry Gauze 5. Secured With Tape Electronic Signature(s) Signed: 10/08/2019 3:46:47 PM By: Jones, Dedrick EMT/HBOT Signed: 10/12/2019 2:52:36 PM By: Epps, Carrie RN Entered By: Jones, Dedrick on 10/08/2019 14:10:04 -------------------------------------------------------------------------------- Wound Assessment Details Patient Name: Date of Service: [name], [name] J. 10/06/2019 11:00 AM Medical Record Number:[id number] Patient Account Number: [id number] Date of Birth/Sex: Treating RN: 12/30/1950 (68 y.o. M) Epps, Carrie Primary Care Provider: MCGOWEN, [name] Other Clinician: Referring Provider: Treating Provider/Extender:[name], Hoyt MCGOWEN, [name] in Treatment: 193 Wound Status Wound Number: 149 Primary Lymphedema Etiology: Wound Location: Left Upper Leg - Medial Wound Open Wounding Event: Blister Status: Date Acquired: 10/04/2019 Comorbid Chronic sinus problems/congestion, Weeks Of Treatment: 0 History: Arrhythmia, Hypertension, Peripheral Arterial Clustered Wound: No Disease, Type II Diabetes, History of Burn, Gout, Confinement Anxiety Photos Wound Measurements Length: (cm) 6 % Reduct Width: (cm) 7.5 % Reduct Depth: (cm) 0.1 Epitheli Area: (cm) 35.343 Tunneli Volume: (cm)  3.534 Undermi Wound Description Classification: Full Thickness Without Exposed Support Foul Odo Structures Slough/F Wound Flat and Intact Margin: Exudate Medium Amount: Exudate Serosanguineous Type: Exudate red, brown Color: Wound Bed Granulation Amount: Medium (34-66%) Granulation Quality: Red Fascia E Necrotic Amount: Medium (34-66%) Fat Laye Necrotic Quality: Adherent Slough Tendon E Muscle E Joint Ex Bone Exp r After Cleansing: No ibrino Yes Exposed Structure xposed: No r (Subcutaneous Tissue) Exposed: Yes xposed: No xposed: No posed: No osed: No ion in Area: 0% ion in Volume: 0% alization: None ng: No ning: No Treatment Notes Wound #149 (Left, Medial Upper Leg) 1. Cleanse With Wound Cleanser Soap and water 3. Primary Dressing Applied Calcium Alginate Ag 4. Secondary Dressing Dry Gauze 5. Secured With Tape Electronic Signature(s) Signed: 10/08/2019 3:46:47 PM By: Jones, Dedrick EMT/HBOT Signed: 10/12/2019 2:52:36 PM By: Epps, Carrie RN Entered By: Jones, Dedrick on 10/08/2019 14:08:49 -------------------------------------------------------------------------------- Vitals Details Patient Name: Date of Service: Florido, Vian J. 10/06/2019 11:00 AM Medical Record Number:[id number] Patient Account Number: [id number] Date of Birth/Sex: Treating RN: 05/19/1951 (68 y.o. M) Epps, Carrie   Primary Care Provider: MCGOWEN, [name] Other Clinician: Referring Provider: Treating Provider/Extender:[name], Hoyt MCGOWEN, [name] in Treatment: 193 Vital Signs Time Taken: 12:04 Temperature (°F): 97.6 Height (in): 70 Pulse (bpm): 66 Weight (lbs): 380.2 Respiratory Rate (breaths/min): 18 Body Mass Index (BMI): 54.5 Blood Pressure (mmHg): 134/4 Reference Range: 80 - 120 mg / dl Electronic Signature(s) Signed: 10/12/2019 2:52:36 PM By: Epps, Carrie RN Entered By: Epps, Carrie on 10/06/2019 12:05:08 

## 2019-10-12 NOTE — Progress Notes (Signed)
[name], [name] ([id number]) Visit Report for 08/04/2019 Arrival Information Details Patient Name: Date of Service: Kevin, [name] 08/04/2019 10:30 AM Medical Record [id number] Patient Account Number: [id number] Date of Birth/Sex: Treating RN: [birth year], [name] (68 y.o. [name]) [name] Primary Care Serina Nichter: [name] Other Clinician: Referring Aftan Vint: Treating Amelda Hapke/Extender:[name], Dema Severin, [name] in Treatment: 43 Visit Information History Since Last Visit Walker All ordered tests and consults were completed: No Patient Arrived: Added or deleted any medications: No Arrival Time: 11:09 Any new allergies or adverse reactions: No Accompanied By: self Had a fall or experienced change in No Transfer Assistance: None activities of daily living that may affect Patient Identification Verified: Yes risk of falls: Secondary Verification Process Completed: Yes Signs or symptoms of abuse/neglect since last No Patient Requires Transmission-Based No visito Precautions: Hospitalized since last visit: No Patient Has Alerts: Yes Implantable device outside of the clinic excluding No cellular tissue based products placed in the center since last visit: Has Dressing in Place as Prescribed: Yes Has Compression in Place as Prescribed: Yes Pain Present Now: No Electronic Signature(s) Signed: 10/12/2019 2:58:33 PM By: [name] RN Entered By: [name] on 08/04/2019 11:09:28 -------------------------------------------------------------------------------- Compression Therapy Details Patient Name: Date of Service: Kevin, [name] 08/04/2019 10:30 AM Medical Record [id number] Patient Account Number: [id number] Date of Birth/Sex: Treating RN: April [birth year], [name] (68 y.o. [name] Primary Care Guled Gahan: [name] Other Clinician: Referring Xandrea Clarey: Treating Kalee Broxton/Extender:[name], Dema Severin, [name] in Treatment: 184 Compression Therapy  Performed for Wound Wound #136 Right,Anterior Lower Leg Assessment: Performed By: Clinician Deon Pilling, RN Compression Type: [name] Post Procedure Diagnosis Same as Pre-procedure Electronic Signature(s) Signed: 08/04/2019 6:37:17 PM By: Baruch Gouty RN, BSN Entered By: Baruch Gouty on 08/04/2019 11:33:27 -------------------------------------------------------------------------------- Compression Therapy Details Patient Name: Date of Service: [name] 08/04/2019 10:30 AM Medical Record [id number] Patient Account Number: [id number] Date of Birth/Sex: Treating RN: [name][birth year] (68 y.o. [name] Primary Care Jessicca Stitzer: [name] Other Clinician: Referring Joeline Freer: Treating Dimitrious Micciche/Extender:[name], Dema Severin, [name] in Treatment: 184 Compression Therapy Performed for Wound Wound #135 Left,Posterior Lower Leg Assessment: Performed By: Clinician Deon Pilling, RN Compression Type: Four Layer Post Procedure Diagnosis Same as Pre-procedure Electronic Signature(s) Signed: 08/04/2019 6:37:17 PM By: Baruch Gouty RN, BSN Entered By: Baruch Gouty on 08/04/2019 11:33:52 -------------------------------------------------------------------------------- Lower Extremity Assessment Details Patient Name: Date of Service: Kevin, [name] 08/04/2019 10:30 AM Medical Record [id number] Patient Account Number: [id number] Date of Birth/Sex: Treating RN: Jun [birth year], [name] (68 y.o. [name]) [name] Primary Care Alexey Rhoads: [name] Other Clinician: Referring Carle Dargan: Treating Zared Knoth/Extender:[name], Dema Severin, [name] in Treatment: 184 Edema Assessment Assessed: [Left: No] [Right: No] Edema: [Left: Yes] [Right: Yes] Calf Left: Right: Point of Measurement: 31 cm From Medial Instep 41 cm 48 cm Ankle Left: Right: Point of Measurement: 12 cm From Medial Instep 28 cm 28 cm Electronic Signature(s) Signed: 10/12/2019 2:58:33 PM By:  [name] RN Entered By: [name] on 08/04/2019 11:11:[name] -------------------------------------------------------------------------------- [place] Details Patient Name: Date of Service: [name]. 08/04/2019 10:30 AM Medical Record [id number] Patient Account Number: [id number] Date of Birth/Sex: Treating RN: Sep [birth year], [name] (68 y.o. [name] Primary Care Vicci Reder: [name] Other Clinician: Referring Fatiha Guzy: Treating Cord Wilczynski/Extender:[name], Dema Severin, [name] in Treatment: 184 Active Inactive Venous Leg Ulcer Nursing Diagnoses: Actual venous Insuffiency (use after diagnosis is confirmed) Goals: Patient will maintain optimal edema control Date Initiated: 09/10/2016 Target Resolution Date: 08/25/2019 Goal Status: Active Verify adequate tissue perfusion prior  to therapeutic compression application Date Initiated: 09/10/2016 Date Inactivated: [name]/25/2018 Goal Status: Met Interventions: Assess peripheral edema status every visit. Compression as ordered Provide education on venous insufficiency Notes: edema not contolled above wraps, pt not using lymoh pumps regularly Wound/Skin Impairment Nursing Diagnoses: Impaired tissue integrity Goals: Patient/caregiver will verbalize understanding of skin care regimen Date Initiated: 09/10/2016 Target Resolution Date: 08/25/2019 Goal Status: Active Interventions: Assess patient/caregiver ability to perform ulcer/skin care regimen upon admission and as needed Assess ulceration(s) every visit Provide education on ulcer and skin care Notes: Electronic Signature(s) Signed: 08/04/2019 6:37:17 PM By: Baruch Gouty RN, BSN Entered By: Baruch Gouty on 08/04/2019 11:20:29 -------------------------------------------------------------------------------- Pain Assessment Details Patient Name: Date of Service: [name] 08/04/2019 10:30 AM Medical Record [id number]  Patient Account Number: [id number] Date of Birth/Sex: Treating RN: [name] (68 y.o. [name]) [name] Primary Care Vale Peraza: [name] Other Clinician: Referring Biviana Saddler: Treating Aldridge Krzyzanowski/Extender:[name], Dema Severin, [name] in Treatment: 184 Active Problems Location of Pain Severity and Description of Pain Patient Has Paino No Site Locations Pain Management and Medication Current Pain Management: Electronic Signature(s) Signed: 10/12/2019 2:58:33 PM By: [name] RN Entered By: [name] on 08/04/2019 11:10:08 -------------------------------------------------------------------------------- Patient/Caregiver Education Details Patient Name: Date of Service: [name] 9/30/2020andnbsp10:30 AM Medical Record [phone] Patient Account Number: [id number] Date of Birth/Gender: Treating RN: [birth year]-[name] (68 y.o. [name] Primary Care Physician: [name] Other Clinician: Referring Physician: Treating Physician/Extender:[name], Dema Severin, [name] in Treatment: 184 Education Assessment Education Provided To: Patient Education Topics Provided Venous: Methods: Explain/Verbal Responses: Reinforcements needed, State content correctly Wound/Skin Impairment: Methods: Explain/Verbal Responses: Reinforcements needed, State content correctly Electronic Signature(s) Signed: 08/04/2019 6:37:17 PM By: Baruch Gouty RN, BSN Entered By: Baruch Gouty on 08/04/2019 11:20:59 -------------------------------------------------------------------------------- Wound Assessment Details Patient Name: Date of Service: [name]. 08/04/2019 10:30 AM Medical Record [id number] Patient Account Number: [id number] Date of Birth/Sex: Treating RN: [birth year][name] (68 y.o. [name]) [name] Primary Care Lexus Barletta: [name] Other Clinician: Referring Nai Dasch: Treating Nargis Abrams/Extender:[name], Dema Severin, [name] in  Treatment: 184 Wound Status Wound Number: 131 Primary Lymphedema Etiology: Wound Location: Left Lower Leg - Lateral Wound Open Wounding Event: Blister Status: Date Acquired: 07/07/2019 Comorbid Chronic sinus problems/congestion, Weeks Of Treatment: 4 History: Arrhythmia, Hypertension, Peripheral Arterial Clustered Wound: Yes Disease, Type II Diabetes, History of Burn, Gout, Confinement Anxiety Photos Wound Measurements Length: (cm) 0.3 % Reduc Width: (cm) 0.4 % Reduc Depth: (cm) 0.[name] Epithel Area: (cm) 0.094 Tunnel Volume: (cm) 0.009 Underm Wound Description Full Thickness Without Exposed Support Foul O Classification: Structures Slough Wound Distinct, outline attached Margin: Exudate Medium Amount: Exudate Serosanguineous Type: Exudate red, brown Color: Wound Bed Granulation Amount: Large (67-100%) Granulation Quality: Red Fascia Necrotic Amount: None Present (0%) Fat Lay Tendon Muscle Joint E Bone Ex dor After Cleansing: No /Fibrino No Exposed Structure Exposed: No er (Subcutaneous Tissue) Exposed: No Exposed: No Exposed: No xposed: No posed: No tion in Area: 82.9% tion in Volume: 83.6% ialization: Large (67-100%) ing: No ining: No Electronic Signature(s) Signed: 10/[name]/2020 3:21:35 PM By: Mikeal Hawthorne EMT/HBOT Signed: 10/12/2019 2:58:33 PM By: [name] RN Entered By: Mikeal Hawthorne on 10/[name]/2020 08:20:37 -------------------------------------------------------------------------------- Wound Assessment Details Patient Name: Date of Service: [name]. 08/04/2019 10:30 AM Medical Record [id number] Patient Account Number: [id number] Date of Birth/Sex: Treating RN: [name][birth year] (68 y.o. [name]) [name] Primary Care Josue Falconi: [name] Other Clinician: Referring Keghan Mcfarren: Treating Briahna Pescador/Extender:[name], Dema Severin, [name] in Treatment: 184 Wound Status Wound Number: 132 Primary Lymphedema  Etiology: Wound Location:  Left Upper Leg - Medial Wound Open Wounding Event: Blister Status: Date Acquired: 07/14/2019 Comorbid Chronic sinus problems/congestion, Weeks Of Treatment: 3 History: Arrhythmia, Hypertension, Peripheral Arterial Clustered Wound: No Disease, Type II Diabetes, History of Burn, Gout, Confinement Anxiety Photos Wound Measurements Length: (cm) 4 % Reduct Width: (cm) 4 % Reduct Depth: (cm) 0.[name] Epitheli Area: (cm) 12.566 Tunneli Volume: (cm) [name].257 Undermi Wound Description Classification: Full Thickness Without Exposed Support Foul Od Structures Slough/ Wound Distinct, outline attached Margin: Exudate Medium Amount: Exudate Serosanguineous Type: Exudate red, brown Color: Wound Bed Granulation Amount: Large (67-100%) Granulation Quality: Red Fascia E Necrotic Amount: None Present (0%) Fat Laye Tendon E Muscle E Joint Ex Bone Exp or After Cleansing: No Fibrino No Exposed Structure xposed: No r (Subcutaneous Tissue) Exposed: No xposed: No xposed: No posed: No osed: No ion in Area: -255.6% ion in Volume: -256.[name]% alization: Large (67-100%) ng: No ning: No Electronic Signature(s) Signed: 10/[name]/2020 3:21:35 PM By: Mikeal Hawthorne EMT/HBOT Signed: 10/12/2019 2:58:33 PM By: [name] RN Entered By: Mikeal Hawthorne on 10/[name]/2020 08:23:46 -------------------------------------------------------------------------------- Wound Assessment Details Patient Name: Date of Service: [name]. 08/04/2019 10:30 AM Medical Record [id number] Patient Account Number: [id number] Date of Birth/Sex: Treating RN: [birth year]/[name] (68 y.o. [name]) Dolores [name], [name] Primary Care Kirill Chatterjee: [name] Other Clinician: Referring Taygen Newsome: Treating Caley Volkert/Extender:[name], Dema Severin, [name] in Treatment: 184 Wound Status Wound Number: 135 Primary Lymphedema Etiology: Wound Location: Left Lower Leg - Posterior Wound Healed - Epithelialized Wounding Event:  Blister Status: Date Acquired: 07/28/2019 Comorbid Chronic sinus problems/congestion, Weeks Of Treatment: [name] History: Arrhythmia, Hypertension, Peripheral Arterial Clustered Wound: No Disease, Type II Diabetes, History of Burn, Gout, Confinement Anxiety Photos Wound Measurements Length: (cm) 0 % Reduc Width: (cm) 0 % Reduc Depth: (cm) 0 Epithel Area: (cm) 0 Tunnel Volume: (cm) 0 Underm Wound Description Classification: Full Thickness Without Exposed Support Foul O Structures Slough Wound Flat and Intact Margin: Exudate None Present Amount: Wound Bed Granulation Amount: None Present (0%) Necrotic Amount: None Present (0%) Fascia Fat Lay Tendon Muscle Joint E Bone Ex Electronic Signature(s) Signed: 10/[name]/2020 3:21:35 PM By: Mikeal Hawthorne EMT/HBOT Signed: 10/12/2019 2:58:33 PM By: [name] RN Entered By: Mikeal Hawthorne on 10/[name] dor After Cleansing: No Lynita Lombard No Exposed Structure Exposed: No er (Subcutaneous Tissue) Exposed: No Exposed: No Exposed: No xposed: No posed: No /2020 08:20:04 tion in Area: 100% tion in Volume: 100% ialization: Large (67-100%) ing: No ining: No -------------------------------------------------------------------------------- Wound Assessment Details Patient Name: Date of Service: Kevin, [name] 08/04/2019 10:30 AM Medical Record [id number] Patient Account Number: [id number] Date of Birth/Sex: Treating RN: [name][birth year] (68 y.o. [name]) [name] Primary Care Lason Eveland: [name] Other Clinician: Referring Ivalene Platte: Treating Mckenzye Cutright/Extender:[name], Dema Severin, [name] in Treatment: 184 Wound Status Wound Number: 136 Primary Lymphedema Etiology: Wound Location: Right Lower Leg - Anterior Wound Open Wounding Event: Gradually Appeared Status: Date Acquired: 07/26/2019 Comorbid Chronic sinus problems/congestion, Weeks Of Treatment: 0 History: Arrhythmia, Hypertension, Peripheral Arterial Clustered Wound:  No Disease, Type II Diabetes, History of Burn, Gout, Confinement Anxiety Photos Wound Measurements Length: (cm) [name] % Reduc Width: (cm) 0.5 % Reduc Depth: (cm) 0.[name] Epithel Area: (cm) 0.393 Tunnel Volume: (cm) 0.039 Underm Wound Description Classification: Full Thickness Without Exposed Support Structures Exudate Medium Amount: Exudate Serosanguineous Type: Exudate red, brown Color: Wound Bed Granulation Amount: None Present (0%) Necrotic Amount: None Present (0%) Foul Odor After Cleansing: No Slough/Fibrino No Exposed Structure Fascia Exposed: No Fat Layer (Subcutaneous Tissue) Exposed: No Tendon  Exposed: No Muscle Exposed: No Joint Exposed: No Bone Exposed: No tion in Area: 0% tion in Volume: 0% ialization: None ing: No ining: No Electronic Signature(s) Signed: 10/[name]/2020 3:21:35 PM By: Mikeal Hawthorne EMT/HBOT Signed: 10/12/2019 2:58:33 PM By: [name] RN Entered By: Mikeal Hawthorne on 10/[name]/2020 08:25:35 -------------------------------------------------------------------------------- Wound Assessment Details Patient Name: Date of Service: [name]. 08/04/2019 10:30 AM Medical Record [id number] Patient Account Number: [id number] Date of Birth/Sex: Treating RN: [name], [name] (68 y.o. [name]) Dolores [name], [name] Primary Care Aaidyn San: [name] Other Clinician: Referring Avner Stroder: Treating Duffy Dantonio/Extender:[name], Dema Severin, [name] in Treatment: 184 Wound Status Wound Number: 137 Primary Lymphedema Etiology: Wound Location: Right Lower Leg - Medial, Proximal Wound Open Status: Wounding Event: Blister Comorbid Chronic sinus problems/congestion, Date Acquired: 08/01/2019 History: Arrhythmia, Hypertension, Peripheral Arterial Weeks Of Treatment: 0 Disease, Type II Diabetes, History of Burn, Clustered Wound: No Gout, Confinement Anxiety Photos Wound Measurements Length: (cm) 13 % Reduc Width: (cm) 11 % Reduc Depth: (cm) 0.[name]  Epithel Area: (cm) 112.312 Tunnel Volume: (cm) 11.231 Underm Wound Description Classification: Full Thickness Without Exposed Support Foul O Structures Classification: Structures Slough/ Exudate Medium Amount: Exudate Serosanguineous Type: Exudate red, brown Color: Wound Bed Granulation Amount: Small ([name]-33%) Granulation Quality: Pink Fascia E Necrotic Amount: Large (67-100%) Fat Laye Necrotic Quality: Adherent Slough Tendon E Muscle E Joint Ex Bone Exp dor After Cleansing: No Fibrino Yes Exposed Structure xposed: No r (Subcutaneous Tissue) Exposed: Yes xposed: No xposed: No posed: No osed: No tion in Area: 0% tion in Volume: 0% ialization: None ing: No ining: No Electronic Signature(s) Signed: 10/[name]/2020 3:21:35 PM By: Mikeal Hawthorne EMT/HBOT Signed: 10/12/2019 2:58:33 PM By: [name] RN Entered By: Mikeal Hawthorne on 10/[name]/2020 08:26:08 -------------------------------------------------------------------------------- Vitals Details Patient Name: Date of Service: [name]. 08/04/2019 10:30 AM Medical Record [id number] Patient Account Number: [id number] Date of Birth/Sex: Treating RN: [birth year]-Mar-[name] (68 y.o. [name]) Dolores [name], [name] Primary Care Shanta Hartner: [name] Other Clinician: Referring Virdie Penning: Treating Vanda Waskey/Extender:[name], Dema Severin, [name] in Treatment: 184 Vital Signs Time Taken: 11:09 Temperature (F): 97.7 Height (in): 70 Pulse (bpm): 54 Weight (lbs): 380.2 Respiratory Rate (breaths/min): 20 Body Mass Index (BMI): 54.5 Blood Pressure (mmHg): 141/65 Reference Range: 80 - 120 mg / dl Electronic Signature(s) Signed: 10/12/2019 2:58:33 PM By: [name] RN Entered By: [name] on 08/04/2019 11:10:00

## 2019-10-12 NOTE — Progress Notes (Signed)
[name] ([id number]) Visit Report for 06/16/2019 Arrival Information Details Patient Name: Date of Service: [name], [name] 06/16/2019 11:00 AM Medical Record [id number] Patient Account Number: [id number] Date of Birth/Sex: Treating [name]: [name][birth year] (68 y.o. [name]) [name] Primary Care [name]: [name] Other Clinician: Referring [name]: Treating [name]/Extender:[name] Weeks in Treatment: 177 Visit Information History Since Last Visit [name] All ordered tests and consults were completed: No Patient Arrived: Added or deleted any medications: No Arrival Time: 12:09 Any new allergies or adverse reactions: No Accompanied By: self Had a fall or experienced change in No Transfer Assistance: None activities of daily living that may affect Patient Identification Verified: Yes risk of falls: Secondary Verification Process Completed: Yes Signs or symptoms of abuse/neglect since last No Patient Requires Transmission-Based No visito Precautions: Hospitalized since last visit: No Patient Has Alerts: Yes Implantable device outside of the clinic excluding No cellular tissue based products placed in the center since last visit: Has Dressing in Place as Prescribed: Yes Has Compression in Place as Prescribed: Yes Pain Present Now: No Electronic Signature(s) Signed: 10/12/2019 3:07:45 PM By: [name] Entered By: [name] on 06/16/2019 12:10:46 -------------------------------------------------------------------------------- Compression Therapy Details Patient Name: Date of Service: [name] 06/16/2019 11:00 AM Medical Record [id number] Patient Account Number: [id number] Date of Birth/Sex: Treating [name]: [name][birth year] (68 y.o. [name] Primary Care Robertta Halfhill: [name] Other Clinician: Referring Paris [name]: Treating [name]/Extender:[name] Weeks in Treatment: 177 Compression Therapy  Performed for Wound NonWound Condition Lymphedema - Right Leg Assessment: Performed By: Clinician Kevin Pilling, [name] Compression Type: [name] Post Procedure Diagnosis Same as Pre-procedure Electronic Signature(s) Signed: 06/16/2019 6:56:37 PM By: Kevin Gouty [name], BSN Entered By: [name] on 06/16/2019 12:55:32 -------------------------------------------------------------------------------- Compression Therapy Details Patient Name: Date of Service: Kevin Calico. 06/16/2019 11:00 AM Medical Record [id number] Patient Account Number: [id number] Date of Birth/Sex: Treating [name]: [name] (68 y.o. [name] Primary Care Christabell Loseke: [name] Other Clinician: Referring [name]: Treating [name]/Extender:[name] Weeks in Treatment: 177 Compression Therapy Performed for Wound Wound #128 Left,Medial Malleolus Assessment: Performed By: Clinician Kevin Pilling, [name] Compression Type: Four Layer Post Procedure Diagnosis Same as Pre-procedure Electronic Signature(s) Signed: 06/16/2019 6:56:37 PM By: Kevin Gouty [name], BSN Entered By: [name] on 06/16/2019 12:56:11 -------------------------------------------------------------------------------- Encounter Discharge Information Details Patient Name: Date of Service: Kevin Calico. 06/16/2019 11:00 AM Medical Record [id number] Patient Account Number: [id number] Date of Birth/Sex: Treating [name]: [birth year][name] (68 y.o. [name] Primary Care Kaitlyn Franko: [name] Other Clinician: Referring [name]: Treating [name]/Extender:[name] Weeks in Treatment: 177 Encounter Discharge Information Items Discharge Condition: Stable Ambulatory Status: [name] Discharge Destination: Home Transportation: Private Auto Accompanied By: self Schedule Follow-up Appointment: Yes Clinical Summary of Care: Patient Declined Electronic Signature(s) Signed:  06/16/2019 6:56:37 PM By: Kevin Gouty [name], BSN Entered By: [name] on 06/16/2019 13:01:18 -------------------------------------------------------------------------------- Lower Extremity Assessment Details Patient Name: Date of Service: Kevin Calico. 06/16/2019 11:00 AM Medical Record [id number] Patient Account Number: [id number] Date of Birth/Sex: Treating [name]: [birth year] (68 y.o. [name]) [name] Primary Care [name]: [name] Other Clinician: Referring [name]: Treating Aliahna Statzer/Extender:[name] Weeks in Treatment: 177 Edema Assessment Assessed: [Left: No] [Right: No] Edema: [Left: Yes] [Right: Yes] Calf Left: Right: Point of Measurement: 31 cm From Medial Instep 40 cm 46 cm Ankle Left: Right: Point of Measurement: 12 cm From Medial Instep 26 cm 27 cm Electronic Signature(s) Signed: 10/12/2019 3:07:45 PM By:  [name] Entered By: [name] on 06/16/2019 12:17:31 -------------------------------------------------------------------------------- [place] Details Patient Name: Date of Service: [name], [name] 06/16/2019 11:00 AM Medical Record [id number] Patient Account Number: [id number] Date of Birth/Sex: Treating [name]: [birth year] (68 y.o. [name] Primary Care [name]: [name] Other Clinician: Referring [name]: Treating Kieara Schwark/Extender:[name] Weeks in Treatment: 177 Active Inactive Venous Leg Ulcer Nursing Diagnoses: Actual venous Insuffiency (use after diagnosis is confirmed) Goals: Patient will maintain optimal edema control Date Initiated: 09/10/2016 Target Resolution Date: 06/30/2019 Goal Status: Active Verify adequate tissue perfusion prior to therapeutic compression application Date Initiated: 09/10/2016 Date Inactivated: 11/28/2016 Goal Status: Met Interventions: Assess peripheral edema status every visit. Compression as ordered Provide  education on venous insufficiency Notes: edema not contolled above wraps, pt not using lymoh pumps regularly Wound/Skin Impairment Nursing Diagnoses: Impaired tissue integrity Goals: Patient/caregiver will verbalize understanding of skin care regimen Date Initiated: 09/10/2016 Target Resolution Date: 06/30/2019 Goal Status: Active Interventions: Assess patient/caregiver ability to perform ulcer/skin care regimen upon admission and as needed Assess ulceration(s) every visit Provide education on ulcer and skin care Notes: Electronic Signature(s) Signed: 06/16/2019 6:56:37 PM By: Kevin Gouty [name], BSN Entered By: [name] on 06/16/2019 13:01:49 -------------------------------------------------------------------------------- Pain Assessment Details Patient Name: Date of Service: Kevin Calico 06/16/2019 11:00 AM Medical Record [id number] Patient Account Number: [id number] Date of Birth/Sex: Treating [name]: [name], [name] (68 y.o. [name]) [name] Primary Care Malisa Ruggiero: [name] Other Clinician: Referring Chalice Philbert: Treating Beauty Pless/Extender:[name] Weeks in Treatment: 177 Active Problems Location of Pain Severity and Description of Pain Patient Has Paino No Site Locations Pain Management and Medication Current Pain Management: Electronic Signature(s) Signed: 10/12/2019 3:07:45 PM By: [name] Entered By: [name] on 06/16/2019 12:12:48 -------------------------------------------------------------------------------- Patient/Caregiver Education Details Patient Name: Date of Service: Kevin Calico 8/12/2020andnbsp11:00 AM Medical Record [id number] Patient Account Number: [id number] Date of Birth/Gender: Treating [name]: [birth year] [name] (68 y.o. [name] Primary Care Physician: [name] Other Clinician: Referring Physician: Treating Physician/Extender:[name] Weeks in Treatment:  177 Education Assessment Education Provided To: Patient Education Topics Provided Venous: Methods: Explain/Verbal Responses: Reinforcements needed, State content correctly Wound/Skin Impairment: Methods: Explain/Verbal Responses: Reinforcements needed, State content correctly Electronic Signature(s) Signed: 06/16/2019 6:56:37 PM By: Kevin Gouty [name], BSN Entered By: [name] on 06/16/2019 13:02:17 -------------------------------------------------------------------------------- Wound Assessment Details Patient Name: Date of Service: Kevin Calico. 06/16/2019 11:00 AM Medical Record [id number] Patient Account Number: [id number] Date of Birth/Sex: Treating [name]: [birth year] [name] (68 y.o. [name]) [name] Primary Care Kenson Groh: [name] Other Clinician: Referring Wilba Mutz: Treating Anamaria Dusenbury/Extender:[name] Weeks in Treatment: 177 Wound Status Wound Number: [id number] Primary Etiology: Venous Leg Ulcer Wound Location: Right, Posterior Lower Leg Wound Status: Open Wounding Event: Gradually Appeared Date Acquired: 05/07/2019 Weeks Of Treatment: 5 Clustered Wound: No Wound Measurements Length: (cm) 0 % Reducti Width: (cm) 0 % Reducti Depth: (cm) 0 Area: (cm) 0 Volume: (cm) 0 Wound Description Full Thickness With Exposed Support Classification: Structures on in Area: 100% on in Volume: 100% Electronic Signature(s) Signed: 10/12/2019 3:07:45 PM By: [name] Entered By: [name] on 06/16/2019 12:34:16 -------------------------------------------------------------------------------- Wound Assessment Details Patient Name: Date of Service: [name], [name] 06/16/2019 11:00 AM Medical Record [id number] Patient Account Number: [id number] Date of Birth/Sex: Treating [name]: [birth year][name]/[name] (68 y.o. [name]) [name] Primary Care Elyanna Wallick: [name] Other Clinician: Referring Cephas Revard: Treating Laretta Pyatt/Extender:Kevin III, Kevin Severin,  [name] Weeks in Treatment: 177 Wound Status Wound Number: [id number]  Etiology: Venous Leg Ulcer Wound Location: Left, Dorsal Foot Wound Status: Open Wounding Event: Gradually Appeared Date Acquired: 05/30/2019 Weeks Of Treatment: 2 Clustered Wound: No Wound Measurements Length: (cm) 0 % Reduc Width: (cm) 0 % Reduc Depth: (cm) 0 Area: (cm) 0 Volume: (cm) 0 Wound Description Classification: Full Thickness Without Exposed Support Structures tion in Area: 100% tion in Volume: 100% Electronic Signature(s) Signed: 10/12/2019 3:07:45 PM By: [name] Entered By: [name] on 06/16/2019 12:34:17 -------------------------------------------------------------------------------- Wound Assessment Details Patient Name: Date of Service: [name], [name] 06/16/2019 11:00 AM Medical Record [id number] Patient Account Number: [id number] Date of Birth/Sex: Treating [name]: [name] (68 y.o. [name]) [name] Primary Care Jezlyn Westerfield: [name] Other Clinician: Referring Sehaj Kolden: Treating Verba Ainley/Extender:[name] Weeks in Treatment: 177 Wound Status Wound Number: [id number] Primary To be determined Etiology: Wound Location: Left Malleolus - Medial Wound Open Wounding Event: Blister Status: Date Acquired: 06/15/2019 Comorbid Chronic sinus problems/congestion, Weeks Of Treatment: 0 History: Arrhythmia, Hypertension, Peripheral Arterial Clustered Wound: No Disease, Type II Diabetes, History of Burn, Gout, Confinement Anxiety Photos Wound Measurements Length: (cm) 0.6 Width: (cm) 4.2 Depth: (cm) 0.1 Area: (cm) 1.979 Volume: (cm) 0.198 Wound Description Classification: Partial Thickness Wound Margin: Flat and Intact Exudate Amount: Small Exudate Type: Serosanguineous Exudate Color: red, brown Wound Bed Granulation Amount: Medium (34-66%) Granulation Quality: Pink Necrotic Amount: None Present (0%) After Cleansing: No rino No Exposed  Structure sed: No Subcutaneous Tissue) Exposed: Yes sed: No sed: No ed: No d: No % Reduction in Area: 0% % Reduction in Volume: 0% Epithelialization: None Tunneling: No Undermining: No Foul Odor Slough/Fib Fascia Expo Fat Layer ( Tendon Expo Muscle Expo Joint Expos Bone Expose Electronic Signature(s) Signed: 06/17/2019 3:18:11 PM By: Mikeal Hawthorne EMT/HBOT Signed: 10/12/2019 3:07:45 PM By: [name] Entered By: Mikeal Hawthorne on 06/17/2019 11:15:38 -------------------------------------------------------------------------------- Vitals Details Patient Name: Date of Service: Kevin Calico. 06/16/2019 11:00 AM Medical Record [id number] Patient Account Number: [id number] Date of Birth/Sex: Treating [name]: [birth year] [name] (68 y.o. [name]) Dolores Lory, [place] Primary Care Raymondo Garcialopez: [name] Other Clinician: Referring Lynsee Wands: Treating Azia Toutant/Extender:[name] Weeks in Treatment: 177 Vital Signs Time Taken: 12:10 Temperature (F): 98.7 Height (in): 70 Pulse (bpm): 79 Weight (lbs): 380.2 Respiratory Rate (breaths/min): 24 Body Mass Index (BMI): 54.5 Blood Pressure (mmHg): 153/67 Reference Range: 80 - 120 mg / dl Electronic Signature(s) Signed: 10/12/2019 3:07:45 PM By: [name] Entered By: [name] on 06/16/2019 12:11:24

## 2019-10-12 NOTE — Progress Notes (Signed)
QUAMAINE, WEBB ([id number]) Visit Report for 07/28/2019 Arrival Information Details Patient Name: Date of Service: Mays, Paino 07/28/2019 2:00 PM Medical Record [id number] Patient Account Number: [id number] Date of Birth/Sex: Treating RN: 09-11-1951 (68 y.o. [name] Primary Care Marcee Jacobs: [name] Other Clinician: Referring Red Mandt: Treating Orlie Cundari/Extender:[name], Dema Severin, [name] in Treatment: 66 Visit Information History Since Last Visit Walker Added or deleted any medications: No Patient Arrived: Any new allergies or adverse reactions: No Arrival Time: 14:15 Had a fall or experienced change in No Accompanied By: self activities of daily living that may affect Transfer Assistance: None risk of falls: Patient Identification Verified: Yes Signs or symptoms of abuse/neglect since last No Secondary Verification Process Completed: Yes visito Patient Requires Transmission-Based No Hospitalized since last visit: No Precautions: Implantable device outside of the clinic excluding No Patient Has Alerts: Yes cellular tissue based products placed in the center since last visit: Has Dressing in Place as Prescribed: Yes Pain Present Now: No Electronic Signature(s) Signed: 10/12/2019 3:04:24 PM By: Sandre Kitty Entered By: Sandre Kitty on 07/28/2019 14:16:19 -------------------------------------------------------------------------------- Compression Therapy Details Patient Name: Date of Service: [name] 07/28/2019 2:00 PM Medical Record [id number] Patient Account Number: [id number] Date of Birth/Sex: Treating RN: 06-Dec-1950 (68 y.o. [name] Primary Care Symantha Steeber: [name] Other Clinician: Referring Avyanna Spada: Treating Jarrid Lienhard/Extender:[name], Dema Severin, [name] in Treatment: 183 Compression Therapy Performed for Wound Wound #131 Left,Lateral Lower Leg Assessment: Performed By:  Clinician Deon Pilling, RN Compression Type: Four Layer Post Procedure Diagnosis Same as Pre-procedure Electronic Signature(s) Signed: 07/28/2019 5:29:01 PM By: Baruch Gouty RN, BSN Entered By: Baruch Gouty on 07/28/2019 15:08:14 -------------------------------------------------------------------------------- Encounter Discharge Information Details Patient Name: Date of Service: [name]. 07/28/2019 2:00 PM Medical Record [id number] Patient Account Number: [id number] Date of Birth/Sex: Treating RN: November 02, 1951 (68 y.o. [name] Primary Care Ladarion Munyon: [name] Other Clinician: Referring [name] [name]: Treating Cosette Prindle/Extender:[name], Dema Severin, [name] in Treatment: 183 Encounter Discharge Information Items Discharge Condition: Stable Ambulatory Status: Walker Discharge Destination: Home Transportation: Private Auto Accompanied By: self Schedule Follow-up Appointment: Yes Clinical Summary of Care: Electronic Signature(s) Signed: 07/28/2019 5:12:23 PM By: Deon Pilling Entered By: Deon Pilling on 07/28/2019 16:33:06 -------------------------------------------------------------------------------- Lower Extremity Assessment Details Patient Name: Date of Service: Que, Meneely 07/28/2019 2:00 PM Medical Record [id number] Patient Account Number: [id number] Date of Birth/Sex: Treating RN: January 21, 1951 (68 y.o. [name] Primary Care Charleston Hankin: [name] Other Clinician: Referring Georganna Maxson: Treating Brendalyn Vallely/Extender:[name], Dema Severin, [name] in Treatment: 183 Edema Assessment Assessed: [Left: Yes] [Right: Yes] Edema: [Left: Yes] [Right: Yes] Calf Left: Right: Point of Measurement: 31 cm From Medial Instep 40.5 cm 45.5 cm Ankle Left: Right: Point of Measurement: 12 cm From Medial Instep 28.5 cm 28 cm Electronic Signature(s) Signed: 07/28/2019 5:12:23 PM By: Deon Pilling Entered By: Deon Pilling on  07/28/2019 14:53:58 -------------------------------------------------------------------------------- Multi-Disciplinary Care Plan Details Patient Name: Date of Service: [name]. 07/28/2019 2:00 PM Medical Record [id number] Patient Account Number: [id number] Date of Birth/Sex: Treating RN: 08-Feb-1951 (68 y.o. [name] Primary Care Jalaina Salyers: [name] Other Clinician: Referring Adilenne Ashworth: Treating Deandrew Hoecker/Extender:[name], Dema Severin, [name] in Treatment: 183 Active Inactive Venous Leg Ulcer Nursing Diagnoses: Actual venous Insuffiency (use after diagnosis is confirmed) Goals: Patient will maintain optimal edema control Date Initiated: 09/10/2016 Target Resolution Date: 08/25/2019 Goal Status: Active Verify adequate tissue perfusion prior to therapeutic compression application Date Initiated: 09/10/2016 Date Inactivated: 11/28/2016 Goal Status: Met Interventions: Assess peripheral edema status  every visit. Compression as ordered Provide education on venous insufficiency Notes: edema not contolled above wraps, pt not using lymoh pumps regularly Wound/Skin Impairment Nursing Diagnoses: Impaired tissue integrity Goals: Patient/caregiver will verbalize understanding of skin care regimen Date Initiated: 09/10/2016 Target Resolution Date: 08/25/2019 Goal Status: Active Interventions: Assess patient/caregiver ability to perform ulcer/skin care regimen upon admission and as needed Assess ulceration(s) every visit Provide education on ulcer and skin care Notes: Electronic Signature(s) Signed: 07/28/2019 5:29:01 PM By: Baruch Gouty RN, BSN Entered By: Baruch Gouty on 07/28/2019 15:01:28 -------------------------------------------------------------------------------- Pain Assessment Details Patient Name: Date of Service: [name] 07/28/2019 2:00 PM Medical Record [id number] Patient Account Number: [id number] Date of  Birth/Sex: Treating RN: November 20, 1950 (68 y.o. [name] Primary Care Derya Dettmann: [name] Other Clinician: Referring Yulian Gosney: Treating Dhruvi Crenshaw/Extender:[name], Dema Severin, [name] in Treatment: 183 Active Problems Location of Pain Severity and Description of Pain Patient Has Paino No Site Locations Pain Management and Medication Current Pain Management: Electronic Signature(s) Signed: 07/28/2019 5:29:01 PM By: Baruch Gouty RN, BSN Signed: 10/12/2019 3:04:24 PM By: Sandre Kitty Entered By: Sandre Kitty on 07/28/2019 14:19:27 -------------------------------------------------------------------------------- Patient/Caregiver Education Details Patient Name: Date of Service: [name] 9/23/2020andnbsp2:00 PM Medical Record [id number] Patient Account Number: [id number] Date of Birth/Gender: Treating RN: September 24, 1951 (68 y.o. [name] Primary Care Physician: [name] Other Clinician: Referring Physician: Treating Physician/Extender:[name], Dema Severin, [name] in Treatment: [place] Education Assessment Education Provided To: Patient Education Topics Provided Venous: Methods: Explain/Verbal Responses: Reinforcements needed, State content correctly Wound/Skin Impairment: Methods: Explain/Verbal Responses: Reinforcements needed, State content correctly Electronic Signature(s) Signed: 07/28/2019 5:29:01 PM By: Baruch Gouty RN, BSN Entered By: Baruch Gouty on 07/28/2019 15:02:02 -------------------------------------------------------------------------------- Wound Assessment Details Patient Name: Date of Service: [name]. 07/28/2019 2:00 PM Medical Record [id number] Patient Account Number: [id number] Date of Birth/Sex: Treating RN: 1950-12-05 (68 y.o. [name] Primary Care Zollie Clemence: [name] Other Clinician: Referring Laderrick Wilk: Treating Ger Ringenberg/Extender:[name], Dema Severin,  [name] in Treatment: 183 Wound Status Wound Number: 130 Primary Lymphedema Etiology: Wound Location: Left Lower Leg - Medial Wound Healed - Epithelialized Wounding Event: Blister Status: Date Acquired: 07/07/2019 Comorbid Chronic sinus problems/congestion, Weeks Of Treatment: 3 History: Arrhythmia, Hypertension, Peripheral Arterial Clustered Wound: No Disease, Type II Diabetes, History of Burn, Gout, Confinement Anxiety Photos Wound Measurements Length: (cm) 0 Width: (cm) 0 Depth: (cm) 0 Area: (cm) 0 Volume: (cm) 0 Wound Description Classification: Partial Thickness % Reduction in Area: 100% % Reduction in Volume: 100% Electronic Signature(s) Signed: 07/29/2019 6:12:41 PM By: Baruch Gouty RN, BSN Signed: 07/30/2019 8:42:43 AM By: Mikeal Hawthorne EMT/HBOT Previous Signature: 07/28/2019 5:29:01 PM Version By: Baruch Gouty RN, BSN Entered By: Mikeal Hawthorne on 07/29/2019 09:18:08 -------------------------------------------------------------------------------- Wound Assessment Details Patient Name: Date of Service: [name]. 07/28/2019 2:00 PM Medical Record [id number] Patient Account Number: [id number] Date of Birth/Sex: Treating RN: 21-Aug-1951 (68 y.o. [name] Primary Care Woodroe Vogan: [name] Other Clinician: Referring Mosella Kasa: Treating Beverlee Wilmarth/Extender:[name], Dema Severin, [name] in Treatment: 183 Wound Status Wound Number: 131 Primary Lymphedema Etiology: Wound Location: Left Lower Leg - Lateral Wound Open Wounding Event: Blister Status: Date Acquired: 07/07/2019 Comorbid Chronic sinus problems/congestion, Weeks Of Treatment: 3 History: Arrhythmia, Hypertension, Peripheral Arterial Clustered Wound: Yes Disease, Type II Diabetes, History of Burn, Gout, Confinement Anxiety Photos Wound Measurements Length: (cm) 1 % Reduct Width: (cm) 1 % Reduct Depth: (cm) 0.1 Epitheli Area: (cm) 0.785 Tunneli Volume: (cm)  0.079 Undermi Wound Description Classification: Full Thickness Without Exposed  Support Foul Od Structures Slough/ Wound Distinct, outline attached Margin: Exudate Medium Amount: Exudate Serosanguineous Type: Exudate red, brown Color: Wound Bed Granulation Amount: Large (67-100%) Granulation Quality: Red Fascia Necrotic Amount: None Present (0%) Fat Lay Tendon Muscle Joint E Bone Ex or After Cleansing: No Fibrino No Exposed Structure Exposed: No er (Subcutaneous Tissue) Exposed: No Exposed: No Exposed: No xposed: No posed: No ion in Area: -42.7% ion in Volume: -43.6% alization: Large (67-100%) ng: No ning: No Electronic Signature(s) Signed: 07/29/2019 6:12:41 PM By: Baruch Gouty RN, BSN Signed: 07/30/2019 8:42:43 AM By: Mikeal Hawthorne EMT/HBOT Previous Signature: 07/28/2019 5:12:23 PM Version By: Deon Pilling Previous Signature: 07/28/2019 5:29:01 PM Version By: Baruch Gouty RN, BSN Entered By: Mikeal Hawthorne on 07/29/2019 09:19:10 -------------------------------------------------------------------------------- Wound Assessment Details Patient Name: Date of Service: [name]. 07/28/2019 2:00 PM Medical Record [id number] Patient Account Number: [id number] Date of Birth/Sex: Treating RN: 01-24-1951 (68 y.o. [name] Primary Care Shianna Bally: [name] Other Clinician: Referring Mong Neal: Treating Latoia Eyster/Extender:[name], Dema Severin, [name] in Treatment: 183 Wound Status Wound Number: 132 Primary Lymphedema Etiology: Wound Location: Left Upper Leg - Medial Wound Open Wounding Event: Blister Status: Date Acquired: 07/14/2019 Comorbid Chronic sinus problems/congestion, Weeks Of Treatment: 2 History: Arrhythmia, Hypertension, Peripheral Arterial Clustered Wound: No Disease, Type II Diabetes, History of Burn, Gout, Confinement Anxiety Photos Wound Measurements Length: (cm) 1.1 % Reduct Width: (cm) 2 % Reduct Depth:  (cm) 0.1 Epitheli Area: (cm) 1.728 Tunneli Volume: (cm) 0.173 Undermi Wound Description Full Thickness Without Exposed Support Foul Od Classification: Structures Slough/ Wound Distinct, outline attached Margin: Exudate Medium Amount: Exudate Serosanguineous Type: Exudate red, brown Color: Wound Bed Granulation Amount: Large (67-100%) Granulation Quality: Red Fascia E Necrotic Amount: None Present (0%) Fat Laye Tendon E Muscle E Joint Ex Bone Exp or After Cleansing: No Fibrino No Exposed Structure xposed: No r (Subcutaneous Tissue) Exposed: No xposed: No xposed: No posed: No osed: No ion in Area: 51.1% ion in Volume: 51% alization: Large (67-100%) ng: No ning: No Electronic Signature(s) Signed: 07/29/2019 6:12:41 PM By: Baruch Gouty RN, BSN Signed: 07/30/2019 8:42:43 AM By: Mikeal Hawthorne EMT/HBOT Previous Signature: 07/28/2019 5:12:23 PM Version By: Deon Pilling Previous Signature: 07/28/2019 5:29:01 PM Version By: Baruch Gouty RN, BSN Entered By: Mikeal Hawthorne on 07/29/2019 09:18:40 -------------------------------------------------------------------------------- Wound Assessment Details Patient Name: Date of Service: [name]. 07/28/2019 2:00 PM Medical Record [id number] Patient Account Number: [id number] Date of Birth/Sex: Treating RN: Jul 03, 1951 (68 y.o. [name] Primary Care Briany Aye: [name] Other Clinician: Referring Brucha Ahlquist: Treating Abcde Oneil/Extender:[name], Dema Severin, [name] in Treatment: 183 Wound Status Wound Number: 253 Primary Lymphedema Etiology: Wound Location: Left Knee - Medial Wound Healed - Epithelialized Wounding Event: Blister Status: Date Acquired: 07/14/2019 Comorbid Chronic sinus problems/congestion, Weeks Of Treatment: 2 History: Arrhythmia, Hypertension, Peripheral Arterial Clustered Wound: No Disease, Type II Diabetes, History of Burn, Gout, Confinement Anxiety Photos Wound  Measurements Length: (cm) 0 Width: (cm) 0 Depth: (cm) 0 Area: (cm) 0 Volume: (cm) 0 Wound Description Classification: Full Thickness Without Exposed Suppor Structures % Reduction in Area: 100% % Reduction in Volume: 100% t Electronic Signature(s) Signed: 07/29/2019 6:12:41 PM By: Baruch Gouty RN, BSN Signed: 07/30/2019 8:42:43 AM By: Mikeal Hawthorne EMT/HBOT Previous Signature: 07/28/2019 5:29:01 PM Version By: Baruch Gouty RN, BSN Entered By: Mikeal Hawthorne on 07/29/2019 09:17:14 -------------------------------------------------------------------------------- Wound Assessment Details Patient Name: Date of Service: [name]. 07/28/2019 2:00 PM Medical Record [id number] Patient Account Number: [id number] Date of Birth/Sex: Treating RN: December 13, 1950 (68  y.o. [name] Primary Care Shammond Arave: [name] Other Clinician: Referring Georgeanne Frankland: Treating Remmi Armenteros/Extender:[name], Dema Severin, [name] in Treatment: 183 Wound Status Wound Number: 134 Primary Lymphedema Etiology: Wound Location: Right Upper Leg - Medial Wound Healed - Epithelialized Wounding Event: Gradually Appeared Status: Date Acquired: 07/14/2019 Comorbid Chronic sinus problems/congestion, Weeks Of Treatment: 2 History: Arrhythmia, Hypertension, Peripheral Arterial Clustered Wound: No Disease, Type II Diabetes, History of Burn, Gout, Confinement Anxiety Photos Wound Measurements Length: (cm) 0 % Reduct Width: (cm) 0 % Reduct Depth: (cm) 0 Epitheli Area: (cm) 0 Tunneli Volume: (cm) 0 Undermi Wound Description Classification: Full Thickness Without Exposed Support Structures Wound Distinct, outline attached Margin: Exudate Medium Amount: Exudate Serosanguineous Type: Exudate red, brown Color: Wound Bed Granulation Amount: Large (67-100%) Granulation Quality: Red, Pink Necrotic Amount: None Present (0%) Foul Odor After Cleansing: No Slough/Fibrino No Exposed  Structure Fascia Exposed: No Fat Layer (Subcutaneous Tissue) Exposed: No Tendon Exposed: No Muscle Exposed: No Joint Exposed: No Bone Exposed: No ion in Area: 100% ion in Volume: 100% alization: Large (67-100%) ng: No ning: No Electronic Signature(s) Signed: 07/29/2019 6:12:41 PM By: Baruch Gouty RN, BSN Signed: 07/30/2019 8:42:43 AM By: Mikeal Hawthorne EMT/HBOT Previous Signature: 07/28/2019 5:29:01 PM Version By: Baruch Gouty RN, BSN Entered By: Mikeal Hawthorne on 07/29/2019 09:14:57 -------------------------------------------------------------------------------- Wound Assessment Details Patient Name: Date of Service: [name]. 07/28/2019 2:00 PM Medical Record [id number] Patient Account Number: [id number] Date of Birth/Sex: Treating RN: Nov 21, 1950 (68 y.o. [name] Primary Care Cal Gindlesperger: [name] Other Clinician: Referring Jjesus Dingley: Treating Gissele Narducci/Extender:[name], Dema Severin, [name] in Treatment: 183 Wound Status Wound Number: 135 Primary Lymphedema Etiology: Wound Location: Left Lower Leg - Posterior Wound Open Wounding Event: Blister Status: Date Acquired: 07/28/2019 Comorbid Chronic sinus problems/congestion, Weeks Of Treatment: 0 History: Arrhythmia, Hypertension, Peripheral Arterial Clustered Wound: No Disease, Type II Diabetes, History of Burn, Gout, Confinement Anxiety Photos Wound Measurements Length: (cm) 1.5 % Reduct Width: (cm) 1.5 % Reduct Depth: (cm) 0.1 Epitheli Area: (cm) 1.767 Tunneli Volume: (cm) 0.177 Undermi Wound Description Classification: Full Thickness Without Exposed Support Foul Od Structures Slough/ Wound Flat and Intact Margin: Exudate Medium Amount: Exudate Serosanguineous Type: Exudate red, brown Color: Wound Bed Granulation Amount: Large (67-100%) Granulation Quality: Pink Fascia Exp Necrotic Amount: None Present (0%) Fat Layer Tendon Exp Muscle Exp Joint Expo Bone  Expos or After Cleansing: No Fibrino No Exposed Structure osed: No (Subcutaneous Tissue) Exposed: No osed: No osed: No sed: No ed: No ion in Area: 0% ion in Volume: 0% alization: None ng: No ning: No Electronic Signature(s) Signed: 07/29/2019 6:12:41 PM By: Baruch Gouty RN, BSN Signed: 07/30/2019 8:42:43 AM By: Mikeal Hawthorne EMT/HBOT Previous Signature: 07/28/2019 5:29:01 PM Version By: Baruch Gouty RN, BSN Entered By: Mikeal Hawthorne on 07/29/2019 09:23:44 -------------------------------------------------------------------------------- Vitals Details Patient Name: Date of Service: [name]. 07/28/2019 2:00 PM Medical Record [id number] Patient Account Number: [id number] Date of Birth/Sex: Treating RN: 05-15-1951 (17 y.o. [name] Primary Care Kebrina Friend: [name] Other Clinician: Referring Aleksa Catterton: Treating Miamarie Moll/Extender:[name], Dema Severin, [name] in Treatment: 183 Vital Signs Time Taken: 14:17 Temperature (F): 97.7 Height (in): 70 Pulse (bpm): 42 Weight (lbs): 380.2 Respiratory Rate (breaths/min): 20 Body Mass Index (BMI): 54.5 Blood Pressure (mmHg): 132/53 Reference Range: 80 - 120 mg / dl Electronic Signature(s) Signed: 10/12/2019 3:04:24 PM By: Sandre Kitty Entered By: Sandre Kitty on 07/28/2019 14:19:20

## 2019-10-13 ENCOUNTER — Encounter (HOSPITAL_BASED_OUTPATIENT_CLINIC_OR_DEPARTMENT_OTHER): Payer: Medicare Other | Admitting: Physician Assistant

## 2019-10-13 DIAGNOSIS — Z794 Long term (current) use of insulin: Secondary | ICD-10-CM | POA: Diagnosis not present

## 2019-10-13 DIAGNOSIS — L03116 Cellulitis of left lower limb: Secondary | ICD-10-CM | POA: Diagnosis not present

## 2019-10-13 DIAGNOSIS — Z9119 Patient's noncompliance with other medical treatment and regimen: Secondary | ICD-10-CM | POA: Diagnosis not present

## 2019-10-13 DIAGNOSIS — E11621 Type 2 diabetes mellitus with foot ulcer: Secondary | ICD-10-CM | POA: Diagnosis not present

## 2019-10-13 DIAGNOSIS — I272 Pulmonary hypertension, unspecified: Secondary | ICD-10-CM | POA: Diagnosis not present

## 2019-10-13 DIAGNOSIS — L97112 Non-pressure chronic ulcer of right thigh with fat layer exposed: Secondary | ICD-10-CM | POA: Diagnosis not present

## 2019-10-13 DIAGNOSIS — I872 Venous insufficiency (chronic) (peripheral): Secondary | ICD-10-CM | POA: Diagnosis not present

## 2019-10-13 DIAGNOSIS — I482 Chronic atrial fibrillation, unspecified: Secondary | ICD-10-CM | POA: Diagnosis not present

## 2019-10-13 DIAGNOSIS — I87333 Chronic venous hypertension (idiopathic) with ulcer and inflammation of bilateral lower extremity: Secondary | ICD-10-CM | POA: Diagnosis not present

## 2019-10-13 DIAGNOSIS — I890 Lymphedema, not elsewhere classified: Secondary | ICD-10-CM | POA: Diagnosis not present

## 2019-10-13 DIAGNOSIS — L97212 Non-pressure chronic ulcer of right calf with fat layer exposed: Secondary | ICD-10-CM | POA: Diagnosis not present

## 2019-10-13 DIAGNOSIS — L97512 Non-pressure chronic ulcer of other part of right foot with fat layer exposed: Secondary | ICD-10-CM | POA: Diagnosis not present

## 2019-10-13 DIAGNOSIS — L97122 Non-pressure chronic ulcer of left thigh with fat layer exposed: Secondary | ICD-10-CM | POA: Diagnosis not present

## 2019-10-13 DIAGNOSIS — L97222 Non-pressure chronic ulcer of left calf with fat layer exposed: Secondary | ICD-10-CM | POA: Diagnosis not present

## 2019-10-13 DIAGNOSIS — I87312 Chronic venous hypertension (idiopathic) with ulcer of left lower extremity: Secondary | ICD-10-CM | POA: Diagnosis not present

## 2019-10-13 DIAGNOSIS — E1151 Type 2 diabetes mellitus with diabetic peripheral angiopathy without gangrene: Secondary | ICD-10-CM | POA: Diagnosis not present

## 2019-10-13 DIAGNOSIS — E11622 Type 2 diabetes mellitus with other skin ulcer: Secondary | ICD-10-CM | POA: Diagnosis not present

## 2019-10-13 NOTE — Progress Notes (Signed)
Kevin, [name] ([id number]) Visit Report for 10/13/2019 HPI Details Patient Name: Date of Service: Kevin, [name] 10/13/2019 8:00 AM Medical Record [id number] Patient Account Number: [id number] Date of Birth/Sex: Treating RN: [birth year] (68 y.o. [name] Primary Care Provider: Nicoletta [name] Other Clinician: Referring Provider: Treating Provider/Extender:[name], [name]: [phone] History of Present Illness HPI Description: Referred by PCP for consultation. Patient has long standing history of BLE venous stasis, no prior ulcerations. At beginning of month, developed cellulitis and weeping. Received IM Rocephin followed by Keflex and resolved. Wears compression stocking, appr 6 months old. Not sure strength. No present drainage. 01/22/16 this is a patient who is a type II diabetic on insulin. 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 He tells me he religiously wears pressure stockings of uncertain strength. He was here with weeping edema about 8 months ago but did not have an open wound. Roughly a month ago he had a reopening on his bilateral legs. He is been using bandages and Neosporin. He does not complain of pain. 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 Last BUN/creatinine I have is from 11/20/15 at 13 and 1.0 respectively 01/29/16; patient arrives today having tolerated the Profore wrap. He brought in his stockings and these are 18 mmHg stockings he bought from [place]. The compression here is likely inadequate. He does not complain of pain or excessive drainage she has no systemic symptoms. The wound on the right looks improved as does the one on the left although one on the left is more substantial with still tissue at risk below the actual wound area on the bilateral posterior calf 02/05/16; patient  arrives with poor edema control. He states that we did put a 4 layer compression on it last week. No weight appear 5 this. 02/12/16; the area on the posterior right Has healed. The left Has a substantial wound that has necrotic surface eschar that requires a debridement with a curette. 02/16/16;the patient called or a Nurse visit secondary to increased swelling. He had been in earlier in the week with his right leg healed. He was transitioned to is on pressure stocking on the right leg with the only open wound on the left, a substantial area on the left posterior calf. Note he has a history of severe lower extremity edema, he has a history of chronic atrial fibrillation but not heart failure per my notes but I'll need to research this. He is not complaining of chest pain shortness of breath or orthopnea. The intake nurse noted blisters on the previously closed right leg 02/19/16; this is the patient's regular visit day. I see him on Friday with escalating edema new wounds on the right leg and clear signs of at least right ventricular heart failure. I increased his Lasix to 40 twice a day. He is returning currently in follow-up. States he is noticed a decrease in that the edema 02/26/16 patient's legs have much less edema. There is nothing really open on the right leg. The left leg has improved condition of the large superficial wound on the posterior left leg 03/04/16; edema control is very much better. The patient's right leg wounds have healed. On the left leg he continues to have severe venous inflammation on the posterior aspect of the left leg. There is no tenderness and I don't think any of this is  cellulitis. 03/11/16; patient's right leg is married healed and he is in his own stocking. The patient's left leg has deteriorated somewhat. There is a lot of erythema around the wound on the posterior left leg. There is also a significant rim of erythema posteriorly just above where the wrap would've  ended there is a new wound in this location and a lot of tenderness. Can't rule out cellulitis in this area. 03/15/16; patient's right leg remains healed and he is in his own stocking. The patient's left leg is much better than last review. His major wound on the posterior aspect of his left Is almost fully epithelialized. He has 3 small injuries from the wraps. Really. Erythema seems a lot better on antibiotics 03/18/16; right leg remains healed and he is in his own stocking. The patient's left leg is much better. The area on the posterior aspect of the left calf is fully epithelialized. His 3 small injuries which were wrap injuries on the left are improved only one seems still open his erythema has resolved 03/25/16; patient's right leg remains healed and he is in his own stocking. There is no open area today on the left leg posterior leg is completely closed up. His wrap injuries at the superior aspect of his leg are also resolved. He looks as though he has some irritation on the dorsal ankle but this is fully epithelialized without evidence of infection. 03/28/16; we discharged this patient on Monday. Transitioned him into his own stocking. There were problems almost immediately with uncontrolled swelling weeping edema multiple some of which have opened. He does not feel systemically unwell in particular no chest pain no shortness of breath and he does not feel 04/08/16; the edema is under better control with the Profore light wrap but he still has pitting edema. There is one large wound anteriorly 2 on the medial aspect of his left leg and 3 small areas on the superior posterior calf. Drainage is not excessive he is tolerating a Profore light well 04/15/16; put a Profore wrap on him last week. This is controlled is edema however he had a lot of pain on his left anterior foot most of his wounds are healed 04/22/16 once again the patient has denuded areas on the left anterior foot which he states are  because his wrap slips up word. He saw his primary physician today is on Lasix 40 twice a day and states that he his weight is down 20 pounds over the last 3 months. 04/29/16: Much improved. left anterior foot much improved. He is now on Lasix 80 mg per day. Much improved edema control 05/06/16; I was hoping to be able to discharge him today however once again he has blisters at a low level of where the compression was placed last week mostly on his left lateral but also his left medial leg and a small area on the anterior part of the left foot. 05/09/16; apparently the patient went home after his appointment on 7/4 later in the evening developing pain in his upper medial thigh together with subjective fever and chills although his temperature was not taken. The pain was so intense he felt he would probably have to call 911. However he then remembered that he had leftover doxycycline from a previous round of antibiotics and took these. By the next morning he felt a lot better. He called and spoke to one of our nurses and I approved doxycycline over the phone thinking that this was in relation to  the wounds we had previously seen although they were definitely were not. The patient feels a lot better old fever no chills he is still working. Blood sugars are reasonably controlled 05/13/16; patient is back in for review of his cellulitis on his anterior medial upper thigh. He is taking doxycycline this is a lot better. Culture I did of the nodular area on the dorsal aspect of his foot grew MRSA this also looks a lot better. 05/20/16; the patient is cellulitis on the medial upper thigh has resolved. All of his wound areas including the left anterior foot, areas on the medial aspect of the left calf and the lateral aspect of the calf at all resolved. He has a new blister on the left dorsal foot at the level of the fourth toe this was excised. No evidence of infection 05/27/16; patient continues to complain  weeping edema. He has new blisterlike wounds on the left anterior lateral and posterior lateral calf at the top of his wrap levels. The area on his left anterior foot appears better. He is not complaining of fever, pain or pruritus in his feet. 05/30/16; the patient's blisters on his left anterior leg posterior calf all look improved. He did not increase the Lasix 100 mg as I suggested because he was going to run out of his 40 mg tablets. He is still having weeping edema of his toes 06/03/16; I renewed his Lasix at 80 mg once a day as he was about to run out when I last saw him. He is on 80 mg of Lasix now. I have asked him to cut down on the excessive amount of water he was drinking and asked him to drink according to his thirst mechanisms 06/12/2016 -- was seen 2 days ago and was supposed to wear his compression stockings at home but he is developed lymphedema and superficial blisters on the left lower extremity and hence came in for a review 06/24/16; the remaining wound is on his left anterior leg. He still has edema coming from between his toes. There is lymphedema here however his edema is generally better than when I last saw this. He has a history of atrial fibrillation but does not have a known history of congestive heart failure nevertheless I think he probably has this at least on a diastolic basis. 07/01/16 I reviewed his echocardiogram from January 2017. This was essentially normal. He did not have LVH, EF of 55-60%. His right ventricular function was normal although he did have trivial tricuspid and pulmonic regurgitation. This is not audible on exam however. I increased his Lasix to do massive edema in his legs well above his knees I think in early July. He was also drinking an excessive amount of water at the time. 07/15/16; missed his appointment last week because of the Labor Day holiday on Monday. He could not get another appointment later in the week. Started to feel the wrap  digging in superiorly so we remove the top half and the bottom half of his wrap. He has extensive erythema and blistering superiorly in the left leg. Very tender. Very swollen. Edema in his foot with leaking edema fluid. He has not been systemically unwell 07/22/16; the area on the left leg laterally required some debridement. The medial wounds look more stable. His wrap injury wounds appear to have healed. Edema and his foot is better, weeping edema is also better. He tells me he is meeting with the supplier of the external compression pumps at work  08/05/16; the patient was on vacation last week in [facility]. His wrap is been on for an extended period of time. Also over the weekend he developed an extensive area of tender erythema across his anterior medial thigh. He took to doxycycline yesterday that he had leftover from a previous prescription. The patient complains of weeping edema coming out of his toes 08/08/16; I saw this patient on 10/2. He was tender across his anterior thigh. I put him on doxycycline. He returns today in follow-up. He does not have any open wounds on his lower leg, he still has edema weeping into his toes. 08/12/16; patient was seen back urgently today to follow-up for his extensive left thigh cellulitis/erysipelas. He comes back with a lot less swelling and erythema pain is much better. I believe I gave him Augmentin and Cipro. His wrap was cut down as he stated a roll down his legs. He developed blistering above the level of the wrap that remained. He has 2 open blisters and 1 intact. 08/19/16; patient is been doing his primary doctor who is increased his Lasix from 40-80 once a day or 80 already has less edema. Cellulitis has remained improved in the left thigh. 2 open areas on the posterior left calf 08/26/16; he returns today having new open blisters on the anterior part of his left leg. He has his compression pumps but is not yet been shown how to use some vital  representative from the supplier. 09/02/16 patient returns today with no open wounds on the left leg. Some maceration in his plantar toes 09/10/2016 -- Dr. Leanord Hawking had recently discharged him on 09/02/2016 and he has come right back with redness swelling and some open ulcers on his left lower extremity. He says this was caused by trying to apply his compression stockings and he's been unable to use this and has not been able to use his lymphedema pumps. He had some doxycycline leftover and he has started on this a few days ago. 09/16/16; there are no open wounds on his leg on the left and no evidence of cellulitis. 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 He does not complain of symptoms here. I am not clear use using his external compression pumps. 09/23/16; I have not seen this patient in 2 weeks. He canceled his appointment 10 days ago as he was going on vacation. He tells me that on Monday he noticed a large area on his posterior left leg which is been draining copiously and is reopened into a large wound. He is been using ABDs and the external part of his juxtalite, according to our nurse this was not on properly. 10/07/16; Still a substantial area on the posterior left leg. Using silver alginate 10/14/16; in general better although there is still open area which looks healthy. Still using silver alginate. He reminds me that this happen before he left for [facility] Today while he was showering in the morning. He had been using his juxtalite's 10/21/16; the area on his posterior left leg is fully epithelialized. However he arrives today with a large area of tender erythema in his medial and posterior left thigh just above the knee. I have marked the area. Once again he is reluctant to consider hospitalization. I treated him with oral antibiotics in the past for a similar situation with resolution I think with doxycycline however  this area it seems more extensive to me. He is not complaining of  fever but does have chills and says states he is thirsty. His blood sugar today was in the 140s at home 10/25/16 the area on his posterior left leg is fully epithelialized although there is still some weeping edema. The large area of tenderness and erythema in his medial and posterior left thigh is a lot less tender although there is still a lot of swelling in this thigh. He states he feels a lot better. He is on doxycycline and Augmentin that I started last week. This will continued until Tuesday, December 26. I have ordered a duplex ultrasound of the left thigh rule out DVT whether there is an abscess something that would need to be drained I would also like to know. 11/01/16; he still has weeping edema from a not fully epithelialized area on his left posterior calf. Most of the rest of this looks a lot better. He has completed his antibiotics. His thigh is a lot better. Duplex ultrasound did not show a DVT in the thigh 11/08/16; he comes in today with more Denuded surface epithelium from the posterior aspect of his calf. There is no real evidence of cellulitis. The superior aspect of his wrap appears to have put quite an indentation in his leg just below the knee and this may have contributed. He does not complain of pain or fever. We have been using silver alginate as the primary dressing. The area of cellulitis in the right thigh has totally resolved. He has been using his compression stockings once a week 11/15/16; the patient arrives today with more loss of epithelium from the posterior aspect of his left calf. He now has a fairly substantial wound in this area. The reason behind this deterioration isn't exactly clear although his edema is not well controlled. He states he feels he is generally more swollen systemically. He is not complaining of chest pain shortness of breath fever. Tells me he has an appointment with his primary  physician in early February. He is on 80 mg of oral Lasix a day. He claims compliance with the external compression pumps. He is not having any pain in his legs similar to what he has with his recurrent cellulitis 11/22/16; the patient arrives a follow-up of his large area on his left lateral calf. This looks somewhat better today. He came in earlier in the week for a dressing change since I saw him a week ago. He is not complaining of any pain no shortness of breath no chest pain 11/28/16; the patient arrives for follow-up of his large area on the left lateral calf this does not look better. In fact it is larger weeping edema. The surface of the wound does not look too bad. We have been using silver alginate although I'm not certain that this is a dressing issue. 12/05/16; again the patient follows up for a large wound on the left lateral and left posterior calf this does not look better. There continues to be weeping edema necrotic surface tissue. More worrisome than this once again there is erythema below the wound involving the distal Achilles and heel suggestive of cellulitis. He is on his feet working most of the day of this is not going well. We are changing his dressing twice a week to facilitate the drainage. 12/12/16; not much change in the overall dimensions of the large area on the left posterior calf. This is very inflamed looking. I gave him an. Doxycycline last week does not really seem to have helped. He found the  wrap very painful indeed it seems to of dog into his legs superiorly and perhaps around the heel. He came in early today because the drainage had soaked through his dressings. 12/19/16- patient arrives for follow-up evaluation of his left lower extremity ulcers. He states that he is using his lymphedema pumps once daily when there is "no drainage". He admits to not using his lipedema pumps while under current [name]. His blood sugars have been consistently between  150-200. 12/26/16; the patient is not using his compression pumps at home because of the wetness on his feet. I've advised him that I think it's important for him to use this daily. He finds his feet too wet, he can put a plastic bag over his legs while he is in the pumps. Otherwise I think will be in a vicious circle. We are using silver alginate to the major area on his left posterior calf 01/02/17; the patient's posterior left leg has further of all into 3 open wounds. All of them covered with a necrotic surface. He claims to be using his compression pumps once a day. His edema control is marginal. Continue with silver alginate 01/10/17; the patient's left posterior leg actually looks somewhat better. There is less edema, less erythema. Still has 3 open areas covered with a necrotic surface requiring debridement. He claims to be using his compression pumps once a day his edema control is better 01/17/17; the patient's left posterior calf look better last week when I saw him and his wrap was changed 2 days ago. He has noted increasing pain in the left heel and arrives today with much larger wounds extensive erythema extending down into the entire heel area especially tender medially. He is not systemically unwell CBGs have been controlled no fever. Our intake nurse showed me limegreen drainage on his AVD pads. 01/24/17; his usual this patient responds nicely to antibiotics last week giving him Levaquin for presumed Pseudomonas. The whole entire posterior part of his leg is much better much less inflamed and in the case of his Achilles heel area much less tender. He has also had some epithelialization posteriorly there are still open areas here and still draining but overall considerably better 01/31/17- He has continue to tolerate the compression wraps. he states that he continues to use the lymphedema pumps daily, and can increase to twice daily on the weekends. He is voicing no complaints or concerns  regarding his LLE ulcers 02/07/17-he is here for follow-up evaluation. He states that he noted some erythema to the left medial and anterior thigh, which he states is new as of yesterday. He is concerned about recurrent cellulitis. He states his blood sugars have been slightly elevated, this morning in the 180s 02/14/17; he is here for follow-up evaluation. When he was last here there was erythema superiorly from his posterior wound in his anterior thigh. He was prescribed Levaquin however a culture of the wound surface grew MRSA over the phone I changed him to doxycycline on Monday and things seem to be a lot better. 02/24/17; patient missed his appointment on Friday therefore we changed his nurse visit into a physician visit today. Still using silver alginate on the large area of the posterior left thigh. He isn't new area on the dorsal left second toe 03/03/17; actually better today although he admits he has not used his external compression pumps in the last 2 days or so because of work responsibilities over the weekend. 03/10/17; continued improvement. External compression pumps once a day  almost all of his wounds have closed on the posterior left calf. Better edema control 03/17/17; in general improved. He still has 3 small open areas on the lateral aspect of his left leg however most of the area on the posterior part of his leg is epithelialized. He has better edema control. He has an ABD pad under his stocking on the right anterior lower leg although he did not let us look at that today. 03/24/17; patient arrives back in clinic today with no open areas however there are areas on the posterior left calf and anterior left calf that are less than 100% epithelialized. His edema is well controlled in the left lower leg. There is some pitting edema probably lymphedema in the left upper thigh. He uses compression pumps at home once per day. I tried to get him to do this twice a day although he is very  reticent. 04/01/2017 -- for the last 2 days he's had significant redness, tenderness and weeping and came in for an urgent visit today. 04/07/17; patient still has 6 more days of doxycycline. He was seen by Dr. Meyer Russel last Wednesday for cellulitis involving the posterior aspect, lateral aspect of his Involving his heel. For the most part he is better there is less erythema and less weeping. He has been on his feet for 12 hours 2 over the weekend. Using his compression pumps once a day 04/14/17 arrives today with continued improvement. Only one area on the posterior left calf that is not fully epithelialized. He has intense bilateral venous inflammation associated with his chronic venous insufficiency disease and secondary lymphedema. We have been using silver alginate to the left posterior calf wound In passing he tells [place] today that the right leg but we have not seen in quite some time has an open area on it but he doesn't want [place] to look at this today states he will show this to [place] next week. 04/21/17; there is no open area on his left leg although he still reports some weeping edema. He showed [place] his right leg today which is the first time we've seen this leg in a long time. He has a large area of open wound on the right leg anteriorly healthy granulation. Quite a bit of swelling in the right leg and some degree of venous inflammation. He told us about the right leg in passing last week but states that deterioration in the right leg really only happened over the weekend 04/28/17; there is no open area on the left leg although there is an irritated part on the posterior which is like a wrap injury. The wound on the right leg which was new from last week at least to [place] is a lot better. 05/05/17; still no open area on the left leg. Patient is using his new compression stocking which seems to be doing a good job of controlling the edema. He states he is using his compression pumps once per day. The  right leg still has an open wound although it is better in terms of surface area. Required debridement. A lot of pain in the posterior right Achilles marked tenderness. Usually this type of presentation this patient gives concern for an active cellulitis 05/12/17; patient arrives today with his major wound from last week on the right lateral leg somewhat better. Still requiring debridement. He was using his compression stocking on the left leg however that is reopened with superficial wounds anteriorly he did not have an open wound on this  leg previously. He is still using his juxta light's once daily at night. He cannot find the time to do this in the morning as he has to be at work by 7 AM 05/19/17; right lateral leg wound looks improved. No debridement required. The concerning area is on the left posterior leg which appears to almost have a subcutaneous hemorrhagic component to it. We've been using silver alginate to all the wounds 05/26/17; the right lateral leg wound continues to look improved. However the area on the left posterior calf is a tightly adherent surface. [place] using silver alginate. Because of the weeping edema in his legs there is very little good alternatives. 06/02/17; the patient left here last week looking quite good. Major wound on the left posterior calf and a small one on the right lateral calf. Both of these look satisfactory. He tells me that by Wednesday he had noted increased pain in the left leg and drainage. He called on Thursday and Friday to get an appointment here but we were blocked. He did not go to urgent care or his primary physician. He thinks he had a fever on Thursday but did not actually take his temperature. He has not been using his compression pumps on the left leg because of pain. I advised him to go to the emergency room today for IV antibiotics for stents of left leg cellulitis but he has refused I have asked him to take 2 days off work to keep his  leg elevated and he has refused this as well. In view of this I'm going to call him and Augmentin and doxycycline. 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 However after a detailed discussion regarding the risk benefits and alternatives he refuses to get admitted to the hospital. With no other recourse I will continue him on oral antibiotics as before and hopefully he'll have his infectious disease consultation this week. 06/16/2017 -- the patient was seen today by the nurse practitioner at infectious disease Ms. Dixon. 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 He has also been advise Lotrimin cream locally. she also had wise IV antibiotics for his condition if it flares up 06/23/17; patient arrives today with drainage bilaterally although the remaining wound on the left posterior calf after cleaning up today "highlighter yellow drainage" did not look too bad. Unfortunately he has had breakdown on the right anterior leg [previously this leg had not been open and he is using a black stocking] he went to see infectious disease and is been put on clindamycin 150 mg daily, I did not verify the dose although I'm not familiar with using clindamycin in this dosing range, perhaps for prophylaxisoo 06/27/17; I brought this patient back today to follow-up on the wound deterioration on the right lower leg together with surrounding cellulitis. I started him on doxycycline 4 days ago. This area looks better however he comes in today with intense cellulitis on the medial part of his left  thigh. This is not have a wound in this area. Extremely tender. We've been using silver alginate  to the wounds on the right lower leg left lower leg with bilateral 4 layer compression he is using his external compression pumps once a day 07/04/17; patient's left medial thigh cellulitis looks better. He has not been using his compression pumps as his insert said it was contraindicated with cellulitis. His right leg continues to make improvements all the wounds are still open. We only have one remaining wound on the left posterior calf. Using silver alginate to all open areas. He is on doxycycline which I started a week ago and should be finishing I gave him Augmentin after Thursday's visit for the severe cellulitis on the left medial thigh which fortunately looks better 07/14/17; the patient's left medial thigh cellulitis has resolved. The cellulitis in his right lower calf on the right also looks better. All of his wounds are stable to improved we've been using silver alginate he has completed the antibiotics I have given him. He has clindamycin 150 mg once a day prescribed by infectious disease for prophylaxis, I've advised him to start this now. We have been using bilateral Unna boots over silver alginate to the wound areas 07/21/17; the patient is been to see infectious disease who noted his recurrent problems with cellulitis. He was not able to tolerate prophylactic clindamycin therefore he is on amoxicillin 500 twice a day. He also had a second daily dose of Lasix added By Dr. Oneta Rack but he is not taking this. Nor is he being completely compliant with his compression pumps a especially not this week. He has 2 remaining wounds one on the right posterior lateral lower leg and one on the left posterior medial lower leg. 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 The patient has Unna boots bilaterally. Still wounds on his  right lateral, left medial, and a new open area on the left anterior lateral lower leg 08/04/17; he remains on amoxicillin twice a day for prophylaxis of recurrent cellulitis. He has bilateral Unna boots for compression and silver alginate to his wounds. Arrives today with his legs looking as good as I have seen him in quite some time. Not surprisingly his wounds look better as well with improvement on the right lateral leg venous insufficiency wound and also the left medial leg. He is still using the compression pumps once a day 08/11/17; both legs appear to be doing better wounds on the right lateral and left medial legs look better. Skin on the right leg quite good. He is been using silver alginate as the primary dressing. I'm going to use Anasept gel calcium alginate and maintain all the secondary dressings 08/18/17; the patient continues to actually do quite well. The area on his right lateral leg is just about closed the left medial also looks better although it is still moist in this area. His edema is well controlled we have been using Anasept gel with calcium alginate and the usual secondary dressings, 4 layer compression and once daily use of his compression pumps "always been able to manage 09/01/17; the patient continues to do reasonably well in spite of his trip to [place]. The area on the right lateral leg is epithelialized. Left is much better but still open. He has more edema and more chronic erythema on the left leg [venous inflammation] 09/08/17; he arrives today with no open wound on the right lateral leg and decently controlled edema. Unfortunately his left leg is not nearly as in his good situation as  last week.he apparently had increasing edema starting on Saturday. He edema soaked through into his foot so used a plastic bag to walk around his home. The area on the medial right leg which was his open area is about the same however he has lost surface epithelium on the  left lateral which is new and he has significant pain in the Achilles area of the left foot. He is already on amoxicillin chronically for prophylaxis of cellulitis in the left leg 09/15/17; he is completed a week of doxycycline and the cellulitis in the left posterior leg and Achilles area is as usual improved. He still has a lot of edema and fluid soaking through his dressings. There is no open wound on the right leg. He saw infectious disease NP today 09/22/17;As usual 1 we transition him from our compression wraps to his stockings things did not go well. He has several small open areas on the right leg. He states this was caused by the compression wrap on his skin although he did not wear this with the stockings over them. He has several superficial areas on the left leg medially laterally posteriorly. He does not have any evidence of active cellulitis especially involving the left Achilles The patient is traveling from [facility] Saturday going to [facility]. He states he isn't attempting to get an appointment with a heel objects wound center there to change his dressings. I am not completely certain whether this will work 10/06/17; the patient came in on Friday for a nurse visit and the nurse reported that his legs actually look quite good. He arrives in clinic today for his regular follow-up visit. He has a new wound on his left third toe over the PIP probably caused by friction with his footwear. He has small areas on the left leg and a very superficial but epithelialized area on the right anterior lateral lower leg. Other than that his legs look as good as I've seen him in quite some time. We have been using silver alginate Review of systems; no chest pain no shortness of breath other than this a 10 point review of systems negative 10/20/17; seen by Dr. Meyer Russel last week. He had taken some antibiotics [doxycycline] that he had left over. Dr. Meyer Russel thought he had candida infection and  declined to give him further antibiotics. He has a small wound remaining on the right lateral leg several areas on the left leg including a larger area on the left posterior several left medial and anterior and a small wound on the left lateral. The area on the left dorsal third toe looks a lot better. ROS; Gen.; no fever, respiratory no cough no sputum Cardiac no chest pain other than this 10 point review of system is negative 10/30/17; patient arrives today having fallen in the bathtub 3 days ago. It took him a while to get up. He has pain and maceration in the wounds on his left leg which have deteriorated. He has not been using his pumps he also has some maceration on the right lateral leg. 11/03/17; patient continues to have weeping edema especially in the left leg. This saturates his dressings which were just put on on 12/27. As usual the doxycycline seems to take care of the cellulitis on his lower leg. He is not complaining of fever, chills, or other systemic symptoms. He states his leg feels a lot better on the doxycycline I gave him empirically. He also apparently gets injections at his primary doctor's officeo Rocephin  for cellulitis prophylaxis. I didn't ask him about his compression pump compliance today I think that's probably marginal. Arrives in the clinic with all of his dressings primary and secondary macerated full of fluid and he has bilateral edema 11/10/17; the patient's right leg looks some better although there is still a cluster of wounds on the right lateral. The left leg is inflamed with almost circumferential skin loss medially to laterally although we are still maintaining anteriorly. He does not have overt cellulitis there is a lot of drainage. He is not using compression pumps. We have been using silver alginate to the wound areas, there are not a lot of options here 11/17/17; the patient's right leg continues to be stable although there is still open wounds, better than  last week. The inflammation in the left leg is better. Still loss of surface layer epithelium especially posteriorly. There is no overt cellulitis in the amount of edema and his left leg is really quite good, tells me he is using his compression pumps once a day. 11/24/17; patient's right leg has a small superficial wound laterally this continues to improve. The inflammation in the left leg is still improving however we have continuous surface layer epithelial loss posteriorly. There is no overt cellulitis in the amount of edema in both legs is really quite good. He states he is using his compression pumps on the left leg once a day for 5 out of 7 days 12/01/17; very small superficial areas on the right lateral leg continue to improve. Edema control in both legs is better today. He has continued loss of surface epithelialization and left posterior calf although I think this is better. We have been using silver alginate with large number of absorptive secondary dressings 4 layer on the left Unna boot on the right at his request. He tells me he is using his compression pumps once a day 12/08/17; he has no open area on the right leg is edema control is good here. On the left leg however he has marked erythema and tenderness breakdown of skin. He has what appears to be a wrap injury just distal to the popliteal fossa. 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 There was swelling erythema and breakdown in the right leg. He has much worse skin breakdown in the left leg as well multiple open areas medially and posteriorly as well as laterally. He tells me he has been using his compression pumps but tells me he feels that the  drainage out of his leg is worse when he uses a compression pumps. To be fair to him he is been saying this for a while however I don't know that I have really been listening to this. I wonder if the compression pumps are working properly 12/22/17;. Once again he arrives with severe erythema, weeping edema from the left greater than right leg. Noncompliance with compression pumps. New this visit he is complaining of pain on the lateral aspect of the right leg and the medial aspect of his right thigh. He apparently saw his cardiologist Dr. [name] who was ordered an echocardiogram area and I think this is a step in the right direction 12/25/17; started his doxycycline Monday night. There is still intense  erythema of the right leg especially in the anterior thigh although there is less tenderness. The erythema around the wound on the right lateral calf also is less tender. He still complaining of pain in the left heel. His wounds are about the same right lateral left medial left lateral. Superficial but certainly not close to closure. He denies being systemically unwell no fever chills no abdominal pain no diarrhea 12/29/17; back in follow-up of his extensive right calf and right thigh cellulitis. I added amoxicillin to cover possible doxycycline resistant strep. This seems to of done the trick he is in much less pain there is much less erythema and swelling. He has his echocardiogram at 11:00 this morning. X-ray of the left heel was also negative. 01/05/18; the patient arrived with his edema under much better control. Now that he is retired he is able to use his compression pumps daily and sometimes twice a day per the patient. He has a wound on the right leg the lateral wound looks better. Area on the left leg also looks a lot better. He has no evidence of cellulitis in his bilateral thighs I had a quick peak at his echocardiogram. He is in normal ejection fraction and normal left ventricular function.  He has moderate pulmonary hypertension moderately reduced right ventricular function. One would have to wonder about chronic sleep apnea although he says he doesn't snore. He'll review the echocardiogram with his cardiologist. 01/12/18; the patient arrives with the edema in both legs under exemplary control. He is using his compression pumps daily and sometimes twice daily. His wound on the right lateral leg is just about closed. He still has some weeping areas on the posterior left calf and lateral left calf although everything is just about closed here as well. I have spoken with Aldean Baker who is the patient's nurse practitioner and infectious disease. She was concerned that the patient had not understood that the parenteral penicillin injections he was receiving for cellulitis prophylaxis was actually benefiting him. I don't think the patient actually saw that I would tend to agree we were certainly dealing with less infections although he had a serious one last month. 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 He is healed. He'll be placed in juxtalite compression wraps and increase his lymphedema pumps to twice daily. We will follow up again next week to ensure there are no issues with the new regiment. 01/20/18-he is here for evaluation of bilateral lower extremity weeping edema. Yesterday he was placed in compression wrap to the right lower extremity and compression stocking to left lower shrubbery. He states he uses lymphedema pumps last night and again this morning and noted a blister to the left lower extremity. On exam he was noted to have drainage to the right lower extremity. He will be placed in Unna boots bilaterally and follow-up next week 01/26/18; patient was actually discharged a week ago to his own juxta light stockings only to return the next day with bilateral lower extremity weeping edema.he was placed in bilateral Unna boots. He  arrives today with pain in the back of his left leg. There is no open area on the right leg however there is a linear/wrap injury on the left leg and weeping edema on the left leg posteriorly. I spoke with infectious disease about 10 days ago. They were disappointed that the patient elected to discontinue prophylactic intramuscular penicillin shots as they felt it was particularly beneficial in reducing the frequency  of his cellulitis. I discussed this with the patient today. He does not share this view. He'll definitely need antibiotics today. Finally he is traveling to [place] and trauma leaving this Saturday and returning a week later and he does not travel with his pumps. He is going by car 01/30/18; patient was seen 4 days ago and brought back in today for review of cellulitis in the left leg posteriorly. I put him on amoxicillin this really hasn't helped as much as I might like. He is also worried because he is traveling to [facility] trauma by car. Finally we will be rewrapping him. There is no open area on the right leg over his left leg has multiple weeping areas as usual 02/09/18; The same wrap on for 10 days. He did not pick up the last doxycycline I prescribed for him. He apparently took 4 days worth he already had. There is nothing open on his right leg and the edema control is really quite good. He's had damage in the left leg medially and laterally especially probably related to the prolonged use of Unna boots 02/12/18; the patient arrived in clinic today for a nurse visit/wrap change. He complained of a lot of pain in the left posterior calf. He is taking doxycycline that I previously prescribed for him. Unfortunately even though he used his stockings and apparently used to compression pumps twice a day he has weeping edema coming out of the lateral part of his right leg. This is coming from the lower anterior lateral skin area. 02/16/18; the patient has finished his doxycycline and  will finish the amoxicillin 2 days. The area of cellulitis in the left calf posteriorly has resolved. He is no longer having any pain. He tells me he is using his compression pumps at least once a day sometimes twice. 02/23/18; the patient finished his doxycycline and Amoxil last week. On Friday he noticed a small erythematous circle about the size of a quarter on the left lower leg just above his ankle. This rapidly expanded and he now has erythema on the lateral and posterior part of the thigh. This is bright red. Also has an area on the dorsal foot just above his toes and a tender area just below the left popliteal fossa. He came off his prophylactic penicillin injections at his own insistence one or 2 months ago. This is obviously deteriorated since then 03/02/18; patient is on doxycycline and Amoxil. Culture I did last week of the weeping area on the back of his left calf grew group B strep. I have therefore renewed the amoxicillin 500 3 times a day for a further week. He has not been systemically unwell. Still complaining of an area of discomfort right under his left popliteal fossa. There is no open wound on the right leg. He tells me that he is using his pumps twice a day on most days 03/09/18; patient arrives in clinic today completing his amoxicillin today. The cellulitis on his left leg is better. Furthermore he tells me that he had intramuscular penicillin shots that his primary care office today. However he also states that the wrap on his right leg fell down shortly after leaving clinic last week. He developed a large blister that was present when he came in for a nurse visit later in the week and then he developed intense discomfort around this area.He tells me he is using his compression pumps 03/16/18; the patient has completed his doxycycline. The infectious part of this/cellulitis in the left  heel area left popliteal area is a lot better. He has 2 open areas on the right calf. Still  areas on the left calf but this is a lot better as well. 03/24/18; the patient arrives complaining of pain in the left popliteal area again. He thinks some of this is wrap injury. He has no open area on the right leg and really no open area on the left calf either except for the popliteal area. He claims to be compliant with the compression pumps 03/31/18; I gave him doxycycline last week because of cellulitis in the left popliteal area. This is a lot better although the surface epithelium is denuded off and response to this. He arrives today with uncontrolled edema in the right calf area as well as a fingernail injury in the right lateral calf. There is only a few open areas on the left 04/06/18; I gave him amoxicillin doxycycline over the last 2 weeks that the amoxicillin should be completing currently. He is not complaining of any pain or systemic symptoms. The only open areas see has is on the right lateral lower leg paradoxically I cannot see anything on the left lower leg. He tells me he is using his compression pumps twice a day on most days. Silver alginate to the wounds that are open under 4 layer compression 04/13/18; he completed antibiotics and has no new complaints. Using his compression pumps. Silver alginate that anything that's opened 04/20/18; he is using his compression pumps religiously. Silver alginate 4 layer compression anything that's opened. He comes in today with no open wounds on the left leg but 3 on the right including a new one posteriorly. He has 2 on the right lateral and one on the right posterior. He likes Unna boots on the right leg for reasons that aren't really clear we had the usual 4 layer compression on the left. It may be necessary to move to the 4 layer compression on the right however for now I left them in the Unna boots 04/27/18; he is using his compression pumps at least once a day. He has still the wounds on the right lateral calf. The area right posteriorly  has closed. He does not have an open wound on the left under 4 layer compression however on the dorsal left foot just proximal to the toes and the left third toe 2 small open areas were identified 05/11/18; he has not uses compression pumps. The areas on the right lateral calf have coalesced into one large wound necrotic surface. On the left side he has one small wound anteriorly however the edema is now weeping out of a large part of his left leg. He says he wasn't using his pumps because of the weeping fluid. I explained to him that this is the time he needs to pump more 05/18/18; patient states he is using his compression pumps twice a day. The area on the right lateral large wound albeit superficial. On the left side he has innumerable number of small new wounds on the left calf particularly laterally but several anteriorly and medially. All these appear to have healthy granulated base these look like the remnants of blisters however they occurred under compression. The patient arrives in clinic today with his legs somewhat better. There is certainly less edema, less multiple open areas on the left calf and the right anterior leg looks somewhat better as well superficial and a little smaller. However he relates pain and erythema over the last 3-4 days in  the thigh and I looked at this today. He has not been systemically unwell no fever no chills no change in blood sugar values 05/25/18; comes in today in a better state. The severe cellulitis on his left leg seems better with the Keflex. Not as tender. He has not been systemically unwell Hard to find an open wound on the left lower leg using his compression pumps twice a day The confluent wounds on his right lateral calf somewhat better looking. These will ultimately need debridement I didn't do this today. 06/01/18; the severe cellulitis on the left anterior thigh has resolved and he is completed his Keflex. There is no open wound on the left leg  however there is a superficial excoriation at the base of the third toe dorsally. Skin on the bottom of his left foot is macerated looking. The left the wounds on the lateral right leg actually looks some better although he did require debridement of the top half of this wound area with an open curet 06/09/18 on evaluation today patient appears to be doing poorly in regard to his right lower extremity in particular this appears to likely be infected he has very thick purulent discharge along with a bright green tent to the discharge. This makes me concerned about the possibility of pseudomonas. He's also having increased discomfort at this point on evaluation. Fortunately there does not appear to be any evidence of infection spreading to the other location at this time. 06/16/18 on evaluation today patient appears to actually be doing fairly well. His ulcer has actually diminished in size quite significantly at this point which is good news. Nonetheless he still does have some evidence of infection he did see infectious disease this morning before coming here for his appointment. I did review the results of their evaluation and their note today. They did actually have him discontinue the Cipro and initiate [name] with linezolid at this time. He is doing this for the next seven days and they recommended a follow-up in four months with them. He is the keep a log of the need for intermittent antibiotic therapy between now and when he falls back up with infectious disease. This will help them gaze what exactly they need to do to try and help them out. 06/23/18; the patient arrives today with no open wounds on the left leg and left third toe healed. He is been using his compression pumps twice a day. On the right lateral leg he still has a sizable wound but this is a lot better than last time I saw this. In my absence he apparently cultured MRSA coming from this wound and is completed a course of  linezolid as has been directed by infectious disease. Has been using silver alginate under 4 layer compression 06/30/18; the only open wound he has is on the right lateral leg and this looks healthy. No debridement is required. We have been using silver alginate. He does not have an open wound on the left leg. There is apparently some drainage from the dorsal proximal third toe on the left although I see no open wound here. 07/03/18 on evaluation today patient was actually here just for a nurse visit rapid change. However when he was here on Wednesday for his rat change due to having been healed on the left and then developing blisters we initiated the wrap again knowing that he would be back today for [place] to reevaluate and see were at. Unfortunately he has developed some cellulitis into  the proximal portion of his right lower extremity even into the region of his thigh. 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 He was placed on one as what at that point. Nonetheless he is done with that and has been tolerating it well otherwise. Doxycycline which in the past really did not seem to be effective for him. Nonetheless I think the best option may be for [place] to definitely reinitiate the antibiotics for a longer period of time. 07/07/18; since I last saw this patient a week ago he has had a difficult time. At that point he did not have an open wound on his left leg. We transitioned him into juxta light stockings. He was apparently in the clinic the next day with blisters on the left lateral and left medial lower calf. He also had weeping edema fluid. He was put back into a compression wrap. He was also in the clinic on Friday with intense erythema in his right thigh. Per the patient he was started on Bactrim however that didn't work at all in terms of relieving his pain and swelling. He has taken 3 doxycycline that he had left over from last time and that seems to of  helped. He has blistering on the right thigh as well. 07/14/18; the erythema on his right thigh has gotten better with doxycycline that he is finishing. 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 Presumably this cellulitis in the thigh was not related to that although I think this is a bit concerning going forward. He still has an area on the right lateral calf the blister on the right medial calf just below the knee that was discussed above. On the left 2 small open areas left medial and left lateral. Edema control is adequate. He is using his compression pumps twice a day 07/20/18; continued improvement in the condition of both legs especially the edema in his bilateral thighs. He tells me he is been losing weight through a combination of diet and exercise. He is using his compression pumps twice a day. So overall she made to the remaining wounds 07/27/2018; continued improvement in condition of both legs. His edema is well controlled. The area on the right lateral leg is just about closed he had one blisters show up on the medial left upper calf. We have him in 4 layer compression. He is going on a 10-day trip to [place], [place] and [place]. He will be driving. He wants to wear Unna boots because of the lessening amount of constriction. He will not use compression pumps while he is away 08/05/18 on evaluation today patient actually appears to be doing decently well all things considered in regard to his bilateral lower extremities. The worst ulcer is actually only posterior aspect of his left lower extremity with a four layer compression wrap cut into his leg a couple weeks back. He did have a trip and actually had [employer] for the trip that he is worn since he was last here. Nonetheless he feels like the [employer] actually do better for him his swelling is up a little bit but he also with his trip was  not taking his Lasix on a regular set schedule like he was supposed to be. He states that obviously the reason being that he cannot drive and keep going without having to urinate too  frequently which makes it difficult. He did not have his pumps with him while he was away either which I think also maybe playing a role here too. 08/13/2018; the patient only has a small open wound on the right lateral calf which is a big improvement in the last month or 2. He also has the area posteriorly just below the posterior fossa on the left which I think was a wrap injury from several weeks ago. He has no current evidence of cellulitis. He tells me he is back into his compression pumps twice a day. He also tells me that while he was at the laundromat somebody stole a section of his extremitease stockings 08/20/2018; back in the clinic with a much improved state. He only has small areas on the right lateral mid calf which is just about healed. This was is more substantial area for quite a prolonged period of time. He has a small open area on the left anterior tibia. The area on the posterior calf just below the popliteal fossa is closed today. He is using his compression pumps twice a day 08/28/2018; patient has no open wound on the right leg. He has a smattering of open areas on the calf with some weeping lymphedema. More problematically than that it looks as though his wraps of slipped down in his usual he has very angry upper area of edema just below the right medial knee and on the right lateral calf. He has no open area on his feet. The patient is traveling to [facility] next week. I will send him in an antibiotic. We will continue to wrap the right leg. We ordered extremitease stockings for him last week and I plan to transition the right leg to a stocking when he gets home which will be in 10 days time. As usual he is very reluctant to take his pumps with him when he travels 09/07/2018; patient  returns from [facility]. He shows me a picture of his left leg in the mid part of his trip last week with intense fire engine erythema. The picture look bad enough I would have considered sending him to the hospital. Instead he went to the wound care center in [place]. They did not prescribe him antibiotics but he did take some doxycycline he had leftover from a previous visit. I had given him trimethoprim sulfamethoxazole before he left this did not work according to the patient. This is resulted in some improvement fortunately. He comes back with a large wound on the left posterior calf. Smaller area on the left anterior tibia. Denuded blisters on the dorsal left foot over his toes. Does not have much in the way of wounds on the right leg although he does have a very tender area on the right posterior area just below the popliteal fossa also suggestive of infection. He promises me he is back on his pumps twice a day 09/15/2018; the intense cellulitis in his left lower calf is a lot better. The wound area on the posterior left calf is also so better. However he has reasonably extensive wounds on the dorsal aspect of his second and third toes and the proximal foot just at the base of the toes. There is nothing open on the right leg 09/22/2018; the patient has excellent edema control in his legs bilaterally. He is using his external compression pumps twice a day. He has no open area on the right leg and only the areas in the left foot  dorsally second and third toe area on the left side. He does not have any signs of active cellulitis. 10/06/2018; the patient has good edema control bilaterally. He has no open wound on the right leg. There is a blister in the posterior aspect of his left calf that we had to deal with today. He is using his compression pumps twice a day. There is no signs of active cellulitis. We have been using silver alginate to the wound areas. He still  has vulnerable areas on the base of his left first second toes dorsally He has a his extremities stockings and we are going to transition him today into the stocking on the right leg. He is cautioned that he will need to continue to use the compression pumps twice a day. If he notices uncontrolled edema in the right leg he may need to go to 3 times a day. 10/13/2018; the patient came in for a nurse check on Friday he has a large flaccid blister on the right medial calf just below the knee. We unroofed this. He has this and a new area underneath the posterior mid calf which was undoubtedly a blister as well. He also has several small areas on the right which is the area we put his extremities stocking on. 10/19/2018; the patient went to see infectious disease this morning I am not sure if that was a routine follow-up in any case the doxycycline I had given him was discontinued and started on linezolid. He has not started this. It is easy to look at his left calf and the inflammation and think this is cellulitis however he is very tender in the tissue just below the popliteal fossa and I have no doubt that there is infection going on here. He states the problem he is having is that with the compression pumps the edema goes down and then starts walking the wrap falls down. We will see if we can adhere this. He has 1 or 2 minuscule open areas on the right still areas that are weeping on the posterior left calf, the base of his left second and third toes 10/26/18; back today in clinic with quite of skin breakdown in his left anterior leg. This may have been infection the area below the popliteal fossa seems a lot better however tremendous epithelial loss on the left anterior mid tibia area over quite inexpensive tissue. He has 2 blisters on the right side but no other open wound here. 10/29/2018; came in urgently to see [place] today and we worked him in for review. He states that the 4 layer compression  on the right leg caused pain he had to cut it down to roughly his mid calf this caused swelling above the wrap and he has blisters and skin breakdown today. As a result of the pain he has not been using his pumps. Both legs are a lot more edematous and there is a lot of weeping fluid. 11/02/18; arrives in clinic with continued difficulties in the right leg> left. Leg is swollen and painful. multiple skin blisters and new open areas especially laterally. He has not been using his pumps on the right leg. 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 He is not systemically unwell. 11/09/2018; the patient claims he is being compliant with his pumps. He is finished the doxycycline I gave him last week. Culture I did of the wound on the right lateral leg showed a few very resistant  methicillin staph aureus. This was resistant to doxycycline. Nevertheless he states the pain in the leg is a lot better which makes me wonder if the cultured organism was not really what was causing the problem nevertheless this is a very dangerous organism to be culturing out of any wound. His right leg is still a lot larger than the left. He is using an Radio broadcast assistant on this area, he blames a 4-layer compression for causing the original skin breakdown which I doubt is true however I cannot talk him out of it. We have been using silver alginate to all of these areas which were initially blisters 11/16/2018; patient is being compliant with his external compression pumps at twice a day. Miraculously he arrives in clinic today with absolutely no open wounds. He has better edema control on the left where he has been using 4 layer compression versus wound of wounds on the right and I pointed this out to him. There is no inflammation in the skin in his lower legs which is also somewhat unusual for him. There is no open wounds on the dorsal left foot. He has extremitease stockings at home and I  have asked him to bring these in next week. 11/25/18 patient's lower extremity on examination today on the left appears for the most part to be wound free. He does have an open wound on the lateral aspect of the right lower extremity but this is minimal compared to what I've seen in past. He does request that we go ahead and wrap the left leg as well even though there's nothing open just so hopefully it will not reopen in short order. 1/28; patient has superficial open wounds on the right lateral calf left anterior calf and left posterior calf. His edema control is adequate. 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 We have been using silver alginate as the primary dressing. He claims compliance with his compression pumps 2/4; patient has superficial open wounds on numerous areas of his left calf and again one on the left dorsal foot. The areas on the right lateral calf have healed. The cellulitis that I gave him doxycycline for last week is also resolved this was mostly on the left anterior calf just below the tibial tuberosity. His edema looks fairly well-controlled. He tells me he went to see his primary doctor today and had blood work ordered 2/11; once again he has several open areas on the left calf left tibial area. Most of these are small and appear to have healthy granulation. He does not have anything open on the right. The edema and control in his thighs is pretty good which is usually a good indication he has been using his pumps as requested. 2/18; he continues to have several small areas on the left calf and left tibial area. Most of these are small healthy granulation. We put him in his stocking on the right leg last week and he arrives with a superficial open area over the right upper tibia and a fairly large area on the right lateral tibia in similar condition. His edema control actually does not  look too bad, he claims to be using his compression pumps twice a day 2/25. Continued small areas on the left calf and left tibial area. New areas especially on the right are identified just below the tibial tuberosity and on the right upper tibia itself. There are also areas  of weeping edema fluid even without an obvious wound. He does not have a considerable degree of lymphedema but clearly there is more edema here than his skin can handle. He states he is using the pumps twice a day. We have an Unna boot on the right and 4 layer compression on the left. 3/3; he continues to have an area on the right lateral calf and right posterior calf just below the popliteal fossa. There is a fair amount of tenderness around the wound on the popliteal fossa but I did not see any evidence of cellulitis, could just be that the wrap came down and rubbed in this area. He does not have an open area on the left leg however there is an area on the left dorsal foot at the base of the third toe We have been using silver alginate to all wound areas 3/10; he did not have an open area on his left leg last time he was here a week ago. Today he arrives with a horizontal wound just below the tibial tuberosity and an area on the left lateral calf. He has intense erythema and tenderness in this area. The area is on the right lateral calf and right posterior calf better than last week. We have been using silver alginate as usual 3/18 - Patient returns with 3 small open areas on left calf, and 1 small open area on right calf, the skin looks ok with no significant erythema, he continues the UNA boot on right and 4 layer compression on left. The right lateral calf wound is closed , the right posterior is small area. we will continue silver alginate to the areas. Culture results from right posterior calf wound is + MRSA sensitive to Bactrim but resistant to DOXY 01/27/19 on evaluation today patient's bilateral lower extremities  actually appear to be doing fairly well at this point which is good news. He is been tolerating the dressing changes without complication. Fortunately she has made excellent improvement in regard to the overall status of his wounds. Unfortunately every time we cease wrapping him he ends up reopening in causing more significant issues at that point. Again I'm unsure of the best direction to take although I think the lymphedema clinic may be appropriate for him. 02/03/19 on evaluation today patient appears to be doing well in regard to the wounds that we saw him for last week unfortunately he has a new area on the proximal portion of his right medial/posterior lower extremity where the wrap somewhat slowed down and caused swelling and a blister to rub and open. Unfortunately this is the only opening that he has on either leg at this point. 02/17/19 on evaluation today patient's bilateral lower extremities appear to be doing well. He still completely healed in regard to the left lower extremity. In regard to the right lower extremity the area where the wrap and slid down and caused the blister still seems to be slightly open although this is dramatically better than during the last evaluation two weeks ago. I'm very pleased with the way this stands overall. 03/03/19 on evaluation today patient appears to be doing well in regard to his right lower extremity in general although he did have a new blister open this does not appear to be showing any evidence of active infection at this time. Fortunately there's No fevers, chills, nausea, or vomiting noted at this time. Overall I feel like he is making good progress it does feel like that the right leg  will we perform the [name] seems to do with a bit better than three layer wrap on the left which slid down on him. We may switch to doing bilateral in the book wraps. 5/4; I have not seen Mr. Stthomas in quite some time. According to our case manager he did not  have an open wound on his left leg last week. He had 1 remaining wound on the right posterior medial calf. He arrives today with multiple openings on the left leg probably were blisters and/or wrap injuries from Unna boots. I do not think the Unna boot's will provide adequate compression on the left. I am also not clear about the frequency he is using the compression pumps. 03/17/19 on evaluation today patient appears to be doing excellent in regard to his lower extremities compared to last week's evaluation apparently. He had gotten significantly worse last week which is unfortunate. The [employer] wrap on the left did not seem to do very well for him at all and in fact it didn't control his swelling significantly enough he had an additional outbreak. Subsequently we go back to the four layer compression wrap on the left. This is good news. At least in that he is doing better and the wound seem to be killing him. He still has not heard anything from the lymphedema clinic. 03/24/19 on evaluation today patient actually appears to be doing much better in regard to his bilateral lower Trinity as compared to last week when I saw him. Fortunately there's no signs of active infection at this time. He has been tolerating the dressing changes without complication. Overall I'm extremely pleased with the progress and appearance in general. 04/07/19 on evaluation today patient appears to be doing well in regard to his bilateral lower extremities. His swelling is significantly down from where it was previous. With that being said he does have a couple blisters still open at this point but fortunately nothing that seems to be too severe and again the majority of the larger openings has healed at this time. 04/14/19 on evaluation today patient actually appears to be doing quite well in regard to his bilateral lower extremities in fact I'm not even sure there's anything significantly open at this time at any site.  Nonetheless he did have some trouble with these wraps where they are somewhat irritating him secondary to the fact that he has noted that the graph wasn't too close down to the end of this foot in a little bit short as well up to his knee. Otherwise things seem to be doing quite well. 04/21/19 upon evaluation today patient's wound bed actually showed evidence of being completely healed in regard to both lower extremities which is excellent news. There does not appear to be any signs of active infection which is also good news. I'm very pleased in this regard. No fevers, chills, nausea, or vomiting noted at this time. 04/28/19 on evaluation today patient appears to be doing a little bit worse in regard to both lower extremities on the left mainly due to the fact that when he went infection disease the wrap was not wrapped quite high enough he developed a blister above this. On the right he is a small open area of nothing too significant but again this is continuing to give him some trouble he has been were in the Velcro compression that he has at home. 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  He's been tolerating the dressing changes without complication. Fortunately there's no signs of active infection at this time. No fevers, chills, nausea, or vomiting noted at this time. We have been trying to get an appointment with her lymphedema clinic in [facility] but unfortunately nobody can get them on phone with not been able to even fax information over the patient likewise is not been able to get in touch with them. Overall I'm not sure exactly what's going on here with to reach out again today. 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 Still having a lot of drainage unfortunately. He tells me especially in the left but even on the right.  There's no signs of active infection which is good news we've been using so ratcheted up to this point. 05/19/19 on evaluation today patient actually appears to be doing quite well with regard to his left lower extremity which is great news. Fortunately in regard to the right lower extremity has an issues with his wrap and he subsequently did remove this from what I'm understanding. Nonetheless long story short is what he had rewrapped once he removed it subsequently had maggots underneath this wrap whenever he came in for evaluation today. With that being said they were obviously completely cleaned away by the nursing staff. The visit today which is excellent news. However he does appear to potentially have some infection around the right ankle region where the maggots were located as well. He will likely require anabiotic therapy today. 05/26/19 on evaluation today patient actually appears to be doing much better in regard to his bilateral lower extremities. I feel like the infection is under much better control. With that being said there were maggots noted when the wrap was removed yet again today. Again this could have potentially been left over from previous although at this time there does not appear to be any signs of significant drainage there was obviously on the wrap some drainage as well this contracted gnats or otherwise. Either way I do not see anything that appears to be doing worse in my pinion and in fact I think his drainage has slowed down quite significantly likely mainly due to the fact to his infection being under better control. 06/02/2019 on evaluation today patient actually appears to be doing well with regard to his bilateral lower extremities there is no signs of active infection at this time which is great news. With that being said he does have several open areas more so on the right than the left but nonetheless these are all significantly better than previously  noted. 06/09/2019 on evaluation today patient actually appears to be doing well. His wrap stayed up and he did not cause any problems he had more drainage on the right compared to the left but overall I do not see any major issues at this time which is great news. 06/16/2019 on evaluation today patient appears to be doing excellent with regard to his lower extremities the only area that is open is a new blister that can have opened as of today on the medial ankle on the left. Other than this he really seems to be doing great I see no major issues at this point. 06/23/2019 on evaluation today patient appears to be doing quite well with regard to his bilateral lower extremities. In fact he actually appears to be almost completely healed there is a small area of weeping  noted of the right lower extremity just above the ankle. Nonetheless fortunately there is no signs of active infection at this time which is good news. No fevers, chills, nausea, vomiting, or diarrhea. 8/24; the patient arrived for a nurse visit today but complained of very significant pain in the left leg and therefore I was asked to look at this. Noted that he did not have an open area on the left leg last week nevertheless this was wrapped. The patient states that he is not been able to put his compression pumps on the left leg because of the discomfort. He has not been systemically unwell 06/30/2019 on evaluation today patient unfortunately despite being excellent last week is doing much worse with regard to his left lower extremity today. In fact he had to come in for a nurse on Monday where his left leg had to be rewrapped due to excessive weeping Dr. Leanord Hawking placed him on doxycycline at that point. Fortunately there is no signs of active infection Systemically at this time which is good news. 07/07/2019 in regard to the patient's wounds today he actually seems to be doing well with his right lower extremity there really is nothing  open or draining at this point this is great news. Unfortunately the left lower extremity is given him additional trouble at this time. There does not appear to be any signs of active infection nonetheless he does have a lot of edema and swelling noted at this point as well as blistering all of which has led to a much more poor appearing leg at this time compared to where it was 2 weeks ago when it was almost completely healed. Obviously this is a little discouraging for the patient. He is try to contact the lymphedema clinic in [place] he has not been able to get through to them. 07/14/2019 on evaluation today patient actually appears to be doing slightly better with regard to his left lower extremity ulcers. Overall I do feel like at least at the top of the wrap that we have been placing this area has healed quite nicely and looks much better. The remainder of the leg is showing signs of improvement. Unfortunately in the thigh area he still has an open region on the left and again on the right he has been utilizing just a Band-Aid on an area that also opened on the thigh. Again this is an area that were not able to wrap although we did do an Ace wrap to provide some compression that something that obviously is a little less effective than the compression wraps we have been using on the lower portion of the leg. He does have an appointment with the lymphedema clinic in [facility] on Friday. 07/21/2019 on evaluation today patient appears to be doing better with regard to his lower extremity ulcers. He has been tolerating the dressing changes without complication. Fortunately there is no signs of active infection at this time. No fevers, chills, nausea, vomiting, or diarrhea. I did receive the paperwork from the physical therapist at the lymphedema clinic in [place]. Subsequently I signed off on that this morning and sent that back to him for further progression with the [name]  plan. 07/28/2019 on evaluation today patient appears to be doing very well with regard to his right lower extremity where I do not see any open wounds at this point. Fortunately he is feeling great as far as that is concerned as well. In regard to the left lower extremity he has been  having issues with still several areas of weeping and edema although the upper leg is doing better his lower leg still I think is going require the compression wrap at this time. No fevers, chills, nausea, vomiting, or diarrhea. 08/04/2019 on evaluation today patient unfortunately is having new wounds on the right lower extremity. Again we have been using Unna boot wrap on that side. We switched him to using his juxta lite wrap at home. With that being said he tells me he has been using it although his legs extremely swollen and to be honest really does not appear that he has been. I cannot know that for sure however. Nonetheless he has multiple new wounds on the right lower extremity at this time. Obviously we will have to see about getting this rewrapped for him today. 08/11/2019 on evaluation today patient appears to be doing fairly well with regard to his wounds. He has been tolerating the dressing changes including the compression wraps without complication. He still has a lot of edema in his upper thigh regions bilaterally he is supposed to be seeing the lymphedema clinic on the 15th of this month once his wraps arrive for the upper part of his legs. 08/18/2019 on evaluation today patient appears to be doing well with regard to his bilateral lower extremities at this point. He has been tolerating the dressing changes without complication. Fortunately there is no signs of active infection which is also good news. He does have a couple weeping areas on the first and second toe of the right foot he also has just a small area on the left foot upper leg and a small area on the left lower leg but overall he is  doing quite well in my opinion. He is supposed to be getting his wraps shortly in fact tomorrow and then subsequently is seeing the lymphedema clinic next Wednesday on the 21st. Of note he is also leaving on the 25th to go on vacation for a week to the beach. For that reason and since there is some uncertainty about what there can be doing at lymphedema clinic next Wednesday I am get a make an appointment for next Friday here for [place] to see what we need to do for him prior to him leaving for vacation. 10/23; patient arrives in considerable pain predominantly in the upper posterior calf just distal to the popliteal fossa also in the wound anteriorly above the major wound. This is probably cellulitis and he has had this recurrently in the past. He has no open wound on the right side and he has had an Radio broadcast assistant in that area. Finally I note that he has an area on the left posterior calf which by enlarge is mostly epithelialized. This protrudes beyond the borders of the surrounding skin in the setting of dry scaly skin and lymphedema. The patient is leaving for [facility] on Sunday. Per his longstanding pattern, he will not take his compression pumps with him predominantly out of fear that they will be stolen. He therefore asked that we put a Unna boot back on the right leg. He will also contact the wound care center in [facility] to see if they can change his dressing in the mid week. 11/3; patient returned from his vacation to [facility]. He was seen on 1 occasion at their wound care center. They did a 2 layer compression system as they did not have our 4-layer wrap. I am not completely certain what they put on the  wounds. They did not change the Unna boot on the right. The patient is also seeing a lymphedema specialist physical [place]. It appears that he has some compression sleeve for his thighs which indeed look quite a bit better than I am used to seeing. He pumps over  these with his external compression pumps. 11/10; the patient has a new wound on the right medial thigh otherwise there is no open areas on the right. He has an area on the left leg posteriorly anteriorly and medially and an area over the left second toe. We have been using silver alginate. He thinks the injury on his thigh is secondary to friction from the compression sleeve he has. 11/17; the patient has a new wound on the right medial thigh last week. He thinks this is because he did not have a underlying stocking for his thigh juxta lite apparatus. He now has this. The area is fairly large and somewhat angry but I do not think he has underlying cellulitis. He has a intact blister on the right anterior tibial area. Small wound on the right great toe dorsally Small area on the medial left calf. 11/30; the patient does not have any open areas on his right leg and we did not take his juxta lite stocking off. However he states that on Friday his compression wrap fell down lodging around his upper mid calf area. As usual this creates a lot of problems for him. He called urgently today to be seen for a nurse visit however the nurse visit turned into a provider visit because of extreme erythema and pain in the left anterior tibia extending laterally and posteriorly. The area that is problematic is extensive 10/06/2019 upon evaluation today patient actually appears to be doing poorly in regard to his left lower extremity. He Dr. Leanord Hawkingobson did place him on doxycycline this past Monday apparently due to the fact that he was doing much worse in regard to this left leg. Fortunately the doxycycline does seem to be helping. Unfortunately we are still having a very difficult time getting his edema under any type of control in order to anticipate discharge at some point. The only way were really able to control his lymphedema really is with compression wraps and that has only even seemingly temporary. He has been  seeing a lymphedema clinic they are trying to help in this regard but still this has been somewhat frustrating in general for the patient. 10/13/19 on evaluation today patient appears to be doing excellent with regard to his right lower extremity as far as the wounds are concerned. His swelling is still quite extensive unfortunately. He is still having a lot of drainage from the thigh areas bilaterally which is unfortunate. He's been going to lymphedema clinic but again he still really does not have this edema under control as far as his lower extremities are concern. With regard to his left lower extremity this seems to be improving and I do believe the doxycycline has been of benefit for him. He is about to complete the doxycycline. Electronic Signature(s) Signed: 10/13/2019 12:32:42 PM By: Lenda KelpStone III, Rahiem Schellinger PA-C Entered By: Lenda KelpStone III, Milon Dethloff on 10/13/2019 08:41:51 -------------------------------------------------------------------------------- Physical Exam Details Patient Name: Date of Service: [name], [name] 10/13/2019 8:00 AM Medical Record [id number] Patient Account Number: [id number] Date of Birth/Sex: Treating RN: 01/11/1951 (68 y.o. Kevin KollerM) Lynch, Shatara Primary Care Provider: Nicoletta BaMCGOWEN, Kevin Other Clinician: Referring Provider: Treating Provider/Extender:Kevin III, Briant CedarHoyt MCGOWEN, [name]: 194 Constitutional Obese and  well-hydrated in no acute distress. Respiratory normal breathing without difficulty. Psychiatric this patient is able to make decisions and demonstrates good insight into disease process. Alert and Oriented x 3. pleasant and cooperative. Notes Patients wounds on his bilateral lower extremities do seem to be showing signs of improvement which is good news. With that being said there is no evidence of active infection in a row I feel like that is cleared. Still its gonna take some time for this to completely resolve and get back under control as far  as his Adema is concerned. With regard to his upper legs these areas seem to be somewhat broken down where the alginate is in place I think it may be getting too wet or than staying on too long. I believe the patient may do better with xtrasorb. Electronic Signature(s) Signed: 10/13/2019 12:32:42 PM By: Lenda Kelp PA-C Entered By: Lenda Kelp on 10/13/2019 08:43:01 -------------------------------------------------------------------------------- Physician Orders Details Patient Name: Date of Service: [name], [name] 10/13/2019 8:00 AM Medical Record [id number] Patient Account Number: [id number] Date of Birth/Sex: Treating RN: 01-Dec-1950 (68 y.o. 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 Dressing Change Frequency Do not change entire dressing for one week. - both legs Wound #145 Right,Medial Upper Leg Change dressing every day. Wound #149 Left,Medial Upper Leg Change dressing every day. Skin Barriers/Peri-Wound Care TCA Cream or Ointment - mix with lotion both legs Wound Cleansing May shower with protection. Primary Wound Dressing Wound #145 Right,Medial Upper Leg Other: - Xtrasorb (use Zetuvit in clinic) Wound #147 Left,Circumferential Lower  Leg Calcium Alginate with Silver Wound #149 Left,Medial Upper Leg Other: - Xtrasorb (use Zetuvit in clinic) Secondary Dressing Wound #145 Right,Medial Upper Leg ABD pad Wound #147 Left,Circumferential Lower Leg ABD pad Kerramax - Zetuvit Wound #149 Left,Medial Upper Leg ABD pad - secured with tape Edema Control 4 layer compression: Left lower extremity - unna boot at upper portion of lower leg. Unna Boot to Right Lower Extremity Avoid standing for long periods of time Elevate legs to the level of the heart or above for 30 minutes daily and/or when sitting, a frequency of: Exercise regularly Segmental Compressive Device. - lymphadema pumps 60 min 2 times per day Electronic Signature(s) Signed: 10/13/2019 12:32:42 PM By: Lenda Kelp PA-C Signed: 10/13/2019 4:59:21 PM By: [name] RN, BSN Entered By: [name] on 10/13/2019 08:42:53 -------------------------------------------------------------------------------- Problem List Details Patient Name: Date of Service: [name] 10/13/2019 8:00 AM Medical Record [id number] Patient Account Number: [id number] Date of Birth/Sex: Treating RN: 1951-09-26 (68 y.o. [name] Primary Care Provider: Nicoletta [name] Other Clinician: Referring Provider: Treating Provider/Extender:[name], [name]: 194 Active Problems ICD-10 Evaluated Encounter Code Description Active Date Today Diagnosis L97.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 right calf limited to 06/30/2018 No Yes breakdown of skin L97.221 Non-pressure chronic ulcer of left calf limited to 09/30/2016 No Yes breakdown of skin I87.333 Chronic venous  hypertension (idiopathic) with ulcer 01/22/2016 No Yes and inflammation of bilateral lower extremity I89.0 Lymphedema, not elsewhere classified 01/22/2016 No Yes E11.622 Type 2 diabetes mellitus with other skin ulcer 01/22/2016 No Yes E11.40 Type 2 diabetes mellitus with diabetic  neuropathy, 01/22/2016 No Yes unspecified L03.116 Cellulitis of left lower limb 04/01/2017 No Yes Inactive Problems ICD-10 Code Description Active Date Inactive Date 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 06/30/2017 skin L97.521 Non-pressure chronic ulcer of other part of left foot limited to 04/27/2018 04/27/2018 breakdown of skin L03.115 Cellulitis of right lower limb 12/22/2017 12/22/2017 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 with other specified 06/30/2018 06/30/2018 severity L97.511 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 06/30/2018 06/30/2018 breakdown of skin Resolved Problems Electronic Signature(s) Signed: 10/13/2019 12:32:42 PM By: Lenda Kelp PA-C Entered By: Lenda Kelp on 10/13/2019 08:34:20 -------------------------------------------------------------------------------- Progress Note/History and Physical Details Patient Name: Date of Service: [name], [name] 10/13/2019 8:00 AM Medical Record [id number] Patient Account Number: [id number] Date of Birth/Sex: Treating RN: 03/23/1951 (68 y.o. [name] Primary Care Provider: Nicoletta [name] Other Clinician: Referring Provider: Treating Provider/Extender:[name], [name]: 194 Subjective History of Present Illness (HPI) Referred by PCP for consultation. Patient has long standing history of BLE venous stasis, no prior ulcerations. At beginning of month, developed cellulitis and weeping. Received IM Rocephin followed by Keflex and resolved. Wears compression stocking, appr 6 months old. Not sure strength. No present drainage. 01/22/16 this is a patient who is a type II diabetic on insulin. 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 He tells me he religiously wears pressure stockings of uncertain strength. He was here with weeping edema about 8 months ago but did not have  an open wound. Roughly a month ago he had a reopening on his bilateral legs. He is been using bandages and Neosporin. He does not complain of pain. 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 Last BUN/creatinine I have is from 11/20/15 at 13 and 1.0 respectively 01/29/16; patient arrives today having tolerated the Profore wrap. He brought in his stockings and these are 18 mmHg stockings he bought from [place]. The compression here is likely inadequate. He does not complain of pain or excessive drainage she has no systemic symptoms. The wound on the right looks improved as does the one on the left although one on the left is more substantial with still tissue at risk below the actual wound area on the bilateral posterior calf 02/05/16; patient arrives with poor edema control. He states that we did put a 4 layer compression on it last week. No weight appear 5 this. 02/12/16; the area on the posterior right Has healed. The left Has a substantial wound that has necrotic surface eschar that requires a debridement with a curette. 02/16/16;the patient called or a Nurse visit secondary to increased swelling. He had been in earlier in the week with his right leg healed. He was transitioned to is on pressure stocking on the right leg with the only open wound on the left, a substantial area on the left posterior calf. Note he has a history of severe lower extremity edema, he has a history of chronic atrial fibrillation but not heart failure per my notes but I'll need to research this. He is not complaining of chest pain  shortness of breath or orthopnea. The intake nurse noted blisters on the previously closed right leg 02/19/16; this is the patient's regular visit day. I see him on Friday with escalating edema new wounds on the right leg and clear signs of at least right ventricular heart failure. I  increased his Lasix to 40 twice a day. He is returning currently in follow-up. States he is noticed a decrease in that the edema 02/26/16 patient's legs have much less edema. There is nothing really open on the right leg. The left leg has improved condition of the large superficial wound on the posterior left leg 03/04/16; edema control is very much better. The patient's right leg wounds have healed. On the left leg he continues to have severe venous inflammation on the posterior aspect of the left leg. There is no tenderness and I don't think any of this is cellulitis. 03/11/16; patient's right leg is married healed and he is in his own stocking. The patient's left leg has deteriorated somewhat. There is a lot of erythema around the wound on the posterior left leg. There is also a significant rim of erythema posteriorly just above where the wrap would've ended there is a new wound in this location and a lot of tenderness. Can't rule out cellulitis in this area. 03/15/16; patient's right leg remains healed and he is in his own stocking. The patient's left leg is much better than last review. His major wound on the posterior aspect of his left Is almost fully epithelialized. He has 3 small injuries from the wraps. Really. Erythema seems a lot better on antibiotics 03/18/16; right leg remains healed and he is in his own stocking. The patient's left leg is much better. The area on the posterior aspect of the left calf is fully epithelialized. His 3 small injuries which were wrap injuries on the left are improved only one seems still open his erythema has resolved 03/25/16; patient's right leg remains healed and he is in his own stocking. There is no open area today on the left leg posterior leg is completely closed up. His wrap injuries at the superior aspect of his leg are also resolved. He looks as though he has some irritation on the dorsal ankle but this is fully epithelialized without evidence of  infection. 03/28/16; we discharged this patient on Monday. Transitioned him into his own stocking. There were problems almost immediately with uncontrolled swelling weeping edema multiple some of which have opened. He does not feel systemically unwell in particular no chest pain no shortness of breath and he does not feel 04/08/16; the edema is under better control with the Profore light wrap but he still has pitting edema. There is one large wound anteriorly 2 on the medial aspect of his left leg and 3 small areas on the superior posterior calf. Drainage is not excessive he is tolerating a Profore light well 04/15/16; put a Profore wrap on him last week. This is controlled is edema however he had a lot of pain on his left anterior foot most of his wounds are healed 04/22/16 once again the patient has denuded areas on the left anterior foot which he states are because his wrap slips up word. He saw his primary physician today is on Lasix 40 twice a day and states that he his weight is down 20 pounds over the last 3 months. 04/29/16: Much improved. left anterior foot much improved. He is now on Lasix 80 mg per day. Much improved edema control  05/06/16; I was hoping to be able to discharge him today however once again he has blisters at a low level of where the compression was placed last week mostly on his left lateral but also his left medial leg and a small area on the anterior part of the left foot. 05/09/16; apparently the patient went home after his appointment on 7/4 later in the evening developing pain in his upper medial thigh together with subjective fever and chills although his temperature was not taken. The pain was so intense he felt he would probably have to call 911. However he then remembered that he had leftover doxycycline from a previous round of antibiotics and took these. By the next morning he felt a lot better. He called and spoke to one of our nurses and I approved doxycycline over  the phone thinking that this was in relation to the wounds we had previously seen although they were definitely were not. The patient feels a lot better old fever no chills he is still working. Blood sugars are reasonably controlled 05/13/16; patient is back in for review of his cellulitis on his anterior medial upper thigh. He is taking doxycycline this is a lot better. Culture I did of the nodular area on the dorsal aspect of his foot grew MRSA this also looks a lot better. 05/20/16; the patient is cellulitis on the medial upper thigh has resolved. All of his wound areas including the left anterior foot, areas on the medial aspect of the left calf and the lateral aspect of the calf at all resolved. He has a new blister on the left dorsal foot at the level of the fourth toe this was excised. No evidence of infection 05/27/16; patient continues to complain weeping edema. He has new blisterlike wounds on the left anterior lateral and posterior lateral calf at the top of his wrap levels. The area on his left anterior foot appears better. He is not complaining of fever, pain or pruritus in his feet. 05/30/16; the patient's blisters on his left anterior leg posterior calf all look improved. He did not increase the Lasix 100 mg as I suggested because he was going to run out of his 40 mg tablets. He is still having weeping edema of his toes 06/03/16; I renewed his Lasix at 80 mg once a day as he was about to run out when I last saw him. He is on 80 mg of Lasix now. I have asked him to cut down on the excessive amount of water he was drinking and asked him to drink according to his thirst mechanisms 06/12/2016 -- was seen 2 days ago and was supposed to wear his compression stockings at home but he is developed lymphedema and superficial blisters on the left lower extremity and hence came in for a review 06/24/16; the remaining wound is on his left anterior leg. He still has edema coming from between his toes.  There is lymphedema here however his edema is generally better than when I last saw this. He has a history of atrial fibrillation but does not have a known history of congestive heart failure nevertheless I think he probably has this at least on a diastolic basis. 07/01/16 I reviewed his echocardiogram from January 2017. This was essentially normal. He did not have LVH, EF of 55-60%. His right ventricular function was normal although he did have trivial tricuspid and pulmonic regurgitation. This is not audible on exam however. I increased his Lasix to do massive edema  in his legs well above his knees I think in early July. He was also drinking an excessive amount of water at the time. 07/15/16; missed his appointment last week because of the Labor Day holiday on Monday. He could not get another appointment later in the week. Started to feel the wrap digging in superiorly so we remove the top half and the bottom half of his wrap. He has extensive erythema and blistering superiorly in the left leg. Very tender. Very swollen. Edema in his foot with leaking edema fluid. He has not been systemically unwell 07/22/16; the area on the left leg laterally required some debridement. The medial wounds look more stable. His wrap injury wounds appear to have healed. Edema and his foot is better, weeping edema is also better. He tells me he is meeting with the supplier of the external compression pumps at work 08/05/16; the patient was on vacation last week in [facility]. His wrap is been on for an extended period of time. Also over the weekend he developed an extensive area of tender erythema across his anterior medial thigh. He took to doxycycline yesterday that he had leftover from a previous prescription. The patient complains of weeping edema coming out of his toes 08/08/16; I saw this patient on 10/2. He was tender across his anterior thigh. I put him on doxycycline. He returns today in follow-up. He does  not have any open wounds on his lower leg, he still has edema weeping into his toes. 08/12/16; patient was seen back urgently today to follow-up for his extensive left thigh cellulitis/erysipelas. He comes back with a lot less swelling and erythema pain is much better. I believe I gave him Augmentin and Cipro. His wrap was cut down as he stated a roll down his legs. He developed blistering above the level of the wrap that remained. He has 2 open blisters and 1 intact. 08/19/16; patient is been doing his primary doctor who is increased his Lasix from 40-80 once a day or 80 already has less edema. Cellulitis has remained improved in the left thigh. 2 open areas on the posterior left calf 08/26/16; he returns today having new open blisters on the anterior part of his left leg. He has his compression pumps but is not yet been shown how to use some vital representative from the supplier. 09/02/16 patient returns today with no open wounds on the left leg. Some maceration in his plantar toes 09/10/2016 -- Dr. Leanord Hawking had recently discharged him on 09/02/2016 and he has come right back with redness swelling and some open ulcers on his left lower extremity. He says this was caused by trying to apply his compression stockings and he's been unable to use this and has not been able to use his lymphedema pumps. He had some doxycycline leftover and he has started on this a few days ago. 09/16/16; there are no open wounds on his leg on the left and no evidence of cellulitis. 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 He does not complain of symptoms here. I am not clear use using his external compression pumps. 09/23/16; I have not seen this patient in 2 weeks. He canceled his appointment 10 days ago as he was going on vacation. He tells me that on Monday he noticed a large area on his posterior left leg which is been draining copiously and is reopened into  a large wound. He is been using ABDs and  the external part of his juxtalite, according to our nurse this was not on properly. 10/07/16; Still a substantial area on the posterior left leg. Using silver alginate 10/14/16; in general better although there is still open area which looks healthy. Still using silver alginate. He reminds me that this happen before he left for [facility]. Today while he was showering in the morning. He had been using his juxtalite's 10/21/16; the area on his posterior left leg is fully epithelialized. However he arrives today with a large area of tender erythema in his medial and posterior left thigh just above the knee. I have marked the area. Once again he is reluctant to consider hospitalization. I treated him with oral antibiotics in the past for a similar situation with resolution I think with doxycycline however this area it seems more extensive to me. He is not complaining of fever but does have chills and says states he is thirsty. His blood sugar today was in the 140s at home 10/25/16 the area on his posterior left leg is fully epithelialized although there is still some weeping edema. The large area of tenderness and erythema in his medial and posterior left thigh is a lot less tender although there is still a lot of swelling in this thigh. He states he feels a lot better. He is on doxycycline and Augmentin that I started last week. This will continued until Tuesday, December 26. I have ordered a duplex ultrasound of the left thigh rule out DVT whether there is an abscess something that would need to be drained I would also like to know. 11/01/16; he still has weeping edema from a not fully epithelialized area on his left posterior calf. Most of the rest of this looks a lot better. He has completed his antibiotics. His thigh is a lot better. Duplex ultrasound did not show a DVT in the thigh 11/08/16; he comes in today with more Denuded surface epithelium from the  posterior aspect of his calf. There is no real evidence of cellulitis. The superior aspect of his wrap appears to have put quite an indentation in his leg just below the knee and this may have contributed. He does not complain of pain or fever. We have been using silver alginate as the primary dressing. The area of cellulitis in the right thigh has totally resolved. He has been using his compression stockings once a week 11/15/16; the patient arrives today with more loss of epithelium from the posterior aspect of his left calf. He now has a fairly substantial wound in this area. The reason behind this deterioration isn't exactly clear although his edema is not well controlled. He states he feels he is generally more swollen systemically. He is not complaining of chest pain shortness of breath fever. Tells me he has an appointment with his primary physician in early February. He is on 80 mg of oral Lasix a day. He claims compliance with the external compression pumps. He is not having any pain in his legs similar to what he has with his recurrent cellulitis 11/22/16; the patient arrives a follow-up of his large area on his left lateral calf. This looks somewhat better today. He came in earlier in the week for a dressing change since I saw him a week ago. He is not complaining of any pain no shortness of breath no chest pain 11/28/16; the patient arrives for follow-up of his large area on the left lateral calf this does not look better. In fact it  is larger weeping edema. The surface of the wound does not look too bad. We have been using silver alginate although I'm not certain that this is a dressing issue. 12/05/16; again the patient follows up for a large wound on the left lateral and left posterior calf this does not look better. There continues to be weeping edema necrotic surface tissue. More worrisome than this once again there is erythema below the wound involving the distal Achilles and heel  suggestive of cellulitis. He is on his feet working most of the day of this is not going well. We are changing his dressing twice a week to facilitate the drainage. 12/12/16; not much change in the overall dimensions of the large area on the left posterior calf. This is very inflamed looking. I gave him an. Doxycycline last week does not really seem to have helped. He found the wrap very painful indeed it seems to of dog into his legs superiorly and perhaps around the heel. He came in early today because the drainage had soaked through his dressings. 12/19/16- patient arrives for follow-up evaluation of his left lower extremity ulcers. He states that he is using his lymphedema pumps once daily when there is "no drainage". He admits to not using his lipedema pumps while under current [name]. His blood sugars have been consistently between 150-200. 12/26/16; the patient is not using his compression pumps at home because of the wetness on his feet. I've advised him that I think it's important for him to use this daily. He finds his feet too wet, he can put a plastic bag over his legs while he is in the pumps. Otherwise I think will be in a vicious circle. We are using silver alginate to the major area on his left posterior calf 01/02/17; the patient's posterior left leg has further of all into 3 open wounds. All of them covered with a necrotic surface. He claims to be using his compression pumps once a day. His edema control is marginal. Continue with silver alginate 01/10/17; the patient's left posterior leg actually looks somewhat better. There is less edema, less erythema. Still has 3 open areas covered with a necrotic surface requiring debridement. He claims to be using his compression pumps once a day his edema control is better 01/17/17; the patient's left posterior calf look better last week when I saw him and his wrap was changed 2 days ago. He has noted increasing pain in the left heel and  arrives today with much larger wounds extensive erythema extending down into the entire heel area especially tender medially. He is not systemically unwell CBGs have been controlled no fever. Our intake nurse showed me limegreen drainage on his AVD pads. 01/24/17; his usual this patient responds nicely to antibiotics last week giving him Levaquin for presumed Pseudomonas. The whole entire posterior part of his leg is much better much less inflamed and in the case of his Achilles heel area much less tender. He has also had some epithelialization posteriorly there are still open areas here and still draining but overall considerably better 01/31/17- He has continue to tolerate the compression wraps. he states that he continues to use the lymphedema pumps daily, and can increase to twice daily on the weekends. He is voicing no complaints or concerns regarding his LLE ulcers 02/07/17-he is here for follow-up evaluation. He states that he noted some erythema to the left medial and anterior thigh, which he states is new as of yesterday. He is concerned about  recurrent cellulitis. He states his blood sugars have been slightly elevated, this morning in the 180s 02/14/17; he is here for follow-up evaluation. When he was last here there was erythema superiorly from his posterior wound in his anterior thigh. He was prescribed Levaquin however a culture of the wound surface grew MRSA over the phone I changed him to doxycycline on Monday and things seem to be a lot better. 02/24/17; patient missed his appointment on Friday therefore we changed his nurse visit into a physician visit today. Still using silver alginate on the large area of the posterior left thigh. He isn't new area on the dorsal left second toe 03/03/17; actually better today although he admits he has not used his external compression pumps in the last 2 days or so because of work responsibilities over the weekend. 03/10/17; continued improvement.  External compression pumps once a day almost all of his wounds have closed on the posterior left calf. Better edema control 03/17/17; in general improved. He still has 3 small open areas on the lateral aspect of his left leg however most of the area on the posterior part of his leg is epithelialized. He has better edema control. He has an ABD pad under his stocking on the right anterior lower leg although he did not let us look at that today. 03/24/17; patient arrives back in clinic today with no open areas however there are areas on the posterior left calf and anterior left calf that are less than 100% epithelialized. His edema is well controlled in the left lower leg. There is some pitting edema probably lymphedema in the left upper thigh. He uses compression pumps at home once per day. I tried to get him to do this twice a day although he is very reticent. 04/01/2017 -- for the last 2 days he's had significant redness, tenderness and weeping and came in for an urgent visit today. 04/07/17; patient still has 6 more days of doxycycline. He was seen by Dr. Meyer Russel last Wednesday for cellulitis involving the posterior aspect, lateral aspect of his Involving his heel. For the most part he is better there is less erythema and less weeping. He has been on his feet for 12 hours o2 over the weekend. Using his compression pumps once a day 04/14/17 arrives today with continued improvement. Only one area on the posterior left calf that is not fully epithelialized. He has intense bilateral venous inflammation associated with his chronic venous insufficiency disease and secondary lymphedema. We have been using silver alginate to the left posterior calf wound In passing he tells [place] today that the right leg but we have not seen in quite some time has an open area on it but he doesn't want [place] to look at this today states he will show this to [place] next week. 04/21/17; there is no open area on his left leg although he  still reports some weeping edema. He showed [place] his right leg today which is the first time we've seen this leg in a long time. He has a large area of open wound on the right leg anteriorly healthy granulation. Quite a bit of swelling in the right leg and some degree of venous inflammation. He told us about the right leg in passing last week but states that deterioration in the right leg really only happened over the weekend 04/28/17; there is no open area on the left leg although there is an irritated part on the posterior which is like a wrap injury.  The wound on the right leg which was new from last week at least to [place] is a lot better. 05/05/17; still no open area on the left leg. Patient is using his new compression stocking which seems to be doing a good job of controlling the edema. He states he is using his compression pumps once per day. The right leg still has an open wound although it is better in terms of surface area. Required debridement. A lot of pain in the posterior right Achilles marked tenderness. Usually this type of presentation this patient gives concern for an active cellulitis 05/12/17; patient arrives today with his major wound from last week on the right lateral leg somewhat better. Still requiring debridement. He was using his compression stocking on the left leg however that is reopened with superficial wounds anteriorly he did not have an open wound on this leg previously. He is still using his juxta light's once daily at night. He cannot find the time to do this in the morning as he has to be at work by 7 AM 05/19/17; right lateral leg wound looks improved. No debridement required. The concerning area is on the left posterior leg which appears to almost have a subcutaneous hemorrhagic component to it. We've been using silver alginate to all the wounds 05/26/17; the right lateral leg wound continues to look improved. However the area on the left posterior calf is a  tightly adherent surface. [place] using silver alginate. Because of the weeping edema in his legs there is very little good alternatives. 06/02/17; the patient left here last week looking quite good. Major wound on the left posterior calf and a small one on the right lateral calf. Both of these look satisfactory. He tells me that by Wednesday he had noted increased pain in the left leg and drainage. He called on Thursday and Friday to get an appointment here but we were blocked. He did not go to urgent care or his primary physician. He thinks he had a fever on Thursday but did not actually take his temperature. He has not been using his compression pumps on the left leg because of pain. I advised him to go to the emergency room today for IV antibiotics for stents of left leg cellulitis but he has refused I have asked him to take 2 days off work to keep his leg elevated and he has refused this as well. In view of this I'm going to call him and Augmentin and doxycycline. 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 However after a detailed discussion regarding the risk benefits and alternatives he refuses to get admitted to the hospital. With no other recourse I will continue him on oral antibiotics as before and hopefully he'll have his infectious disease consultation this week. 06/16/2017 -- the patient was seen today by the nurse practitioner at infectious disease Ms. Dixon. 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 He has also been advise Lotrimin cream locally. she also had wise  IV antibiotics for his condition if it flares up 06/23/17; patient arrives  today with drainage bilaterally although the remaining wound on the left posterior calf after cleaning up today "highlighter yellow drainage" did not look too bad. Unfortunately he has had breakdown on the right anterior leg [previously this leg had not been open and he is using a black stocking] he went to see infectious disease and is been put on clindamycin 150 mg daily, I did not verify the dose although I'm not familiar with using clindamycin in this dosing range, perhaps for prophylaxisoo 06/27/17; I brought this patient back today to follow-up on the wound deterioration on the right lower leg together with surrounding cellulitis. I started him on doxycycline 4 days ago. This area looks better however he comes in today with intense cellulitis on the medial part of his left thigh. This is not have a wound in this area. Extremely tender. We've been using silver alginate to the wounds on the right lower leg left lower leg with bilateral 4 layer compression he is using his external compression pumps once a day 07/04/17; patient's left medial thigh cellulitis looks better. He has not been using his compression pumps as his insert said it was contraindicated with cellulitis. His right leg continues to make improvements all the wounds are still open. We only have one remaining wound on the left posterior calf. Using silver alginate to all open areas. He is on doxycycline which I started a week ago and should be finishing I gave him Augmentin after Thursday's visit for the severe cellulitis on the left medial thigh which fortunately looks better 07/14/17; the patient's left medial thigh cellulitis has resolved. The cellulitis in his right lower calf on the right also looks better. All of his wounds are stable to improved we've been using silver alginate he has completed the antibiotics I have given him. He has clindamycin 150 mg  once a day prescribed by infectious disease for prophylaxis, I've advised him to start this now. We have been using bilateral Unna boots over silver alginate to the wound areas 07/21/17; the patient is been to see infectious disease who noted his recurrent problems with cellulitis. He was not able to tolerate prophylactic clindamycin therefore he is on amoxicillin 500 twice a day. He also had a second daily dose of Lasix added By Dr. Oneta Rack but he is not taking this. Nor is he being completely compliant with his compression pumps a especially not this week. He has 2 remaining wounds one on the right posterior lateral lower leg and one on the left posterior medial lower leg. 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 The patient has Unna boots bilaterally. 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 He has bilateral Unna boots for compression and silver alginate to his wounds. Arrives today with his legs looking as good as I have seen him in quite some time. Not surprisingly his wounds look better as well with improvement on the right lateral leg venous insufficiency wound and also the left medial leg. He is still using the compression pumps once a day 08/11/17; both legs appear to be doing better wounds on the right lateral and left medial legs look better. Skin on the right leg quite good. He is been using silver alginate as the primary dressing. I'm going to use Anasept gel calcium alginate  and maintain all the secondary dressings 08/18/17; the patient continues to actually do quite well. The area on his right lateral leg is just about closed the left medial also looks better although it is still moist in this area. His edema is well controlled we have been using Anasept  gel with calcium alginate and the usual secondary dressings, 4 layer compression and once daily use of his compression pumps "always been able to manage 09/01/17; the patient continues to do reasonably well in spite of his trip to [place]. The area on the right lateral leg is epithelialized. Left is much better but still open. He has more edema and more chronic erythema on the left leg [venous inflammation] 09/08/17; he arrives today with no open wound on the right lateral leg and decently controlled edema. Unfortunately his left leg is not nearly as in his good situation as last week.he apparently had increasing edema starting on Saturday. He edema soaked through into his foot so used a plastic bag to walk around his home. The area on the medial right leg which was his open area is about the same however he has lost surface epithelium on the left lateral which is new and he has significant pain in the Achilles area of the left foot. He is already on amoxicillin chronically for prophylaxis of cellulitis in the left leg 09/15/17; he is completed a week of doxycycline and the cellulitis in the left posterior leg and Achilles area is as usual improved. He still has a lot of edema and fluid soaking through his dressings. There is no open wound on the right leg. He saw infectious disease NP today 09/22/17;As usual 1 we transition him from our compression wraps to his stockings things did not go well. He has several small open areas on the right leg. He states this was caused by the compression wrap on his skin although he did not wear this with the stockings over them. He has several superficial areas on the left leg medially laterally posteriorly. He does not have any evidence of active cellulitis especially involving the left Achilles The patient is traveling from [facility] Saturday going to [facility]. He states he isn't attempting to get an appointment with a heel objects wound center there to  change his dressings. I am not completely certain whether this will work 10/06/17; the patient came in on Friday for a nurse visit and the nurse reported that his legs actually look quite good. He arrives in clinic today for his regular follow-up visit. He has a new wound on his left third toe over the PIP probably caused by friction with his footwear. He has small areas on the left leg and a very superficial but epithelialized area on the right anterior lateral lower leg. Other than that his legs look as good as I've seen him in quite some time. We have been using silver alginate Review of systems; no chest pain no shortness of breath other than this a 10 point review of systems negative 10/20/17; seen by Dr. Meyer Russel last week. He had taken some antibiotics [doxycycline] that he had left over. Dr. Meyer Russel thought he had candida infection and declined to give him further antibiotics. He has a small wound remaining on the right lateral leg several areas on the left leg including a larger area on the left posterior several left medial and anterior and a small wound on the left lateral. The area on the left dorsal third toe looks a lot better.  ROS; Gen.; no fever, respiratory no cough no sputum Cardiac no chest pain other than this 10 point review of system is negative 10/30/17; patient arrives today having fallen in the bathtub 3 days ago. It took him a while to get up. He has pain and maceration in the wounds on his left leg which have deteriorated. He has not been using his pumps he also has some maceration on the right lateral leg. 11/03/17; patient continues to have weeping edema especially in the left leg. This saturates his dressings which were just put on on 12/27. As usual the doxycycline seems to take care of the cellulitis on his lower leg. He is not complaining of fever, chills, or other systemic symptoms. He states his leg feels a lot better on the doxycycline I gave him empirically. He  also apparently gets injections at his primary doctor's officeo Rocephin for cellulitis prophylaxis. I didn't ask him about his compression pump compliance today I think that's probably marginal. Arrives in the clinic with all of his dressings primary and secondary macerated full of fluid and he has bilateral edema 11/10/17; the patient's right leg looks some better although there is still a cluster of wounds on the right lateral. The left leg is inflamed with almost circumferential skin loss medially to laterally although we are still maintaining anteriorly. He does not have overt cellulitis there is a lot of drainage. He is not using compression pumps. We have been using silver alginate to the wound areas, there are not a lot of options here 11/17/17; the patient's right leg continues to be stable although there is still open wounds, better than last week. The inflammation in the left leg is better. Still loss of surface layer epithelium especially posteriorly. There is no overt cellulitis in the amount of edema and his left leg is really quite good, tells me he is using his compression pumps once a day. 11/24/17; patient's right leg has a small superficial wound laterally this continues to improve. The inflammation in the left leg is still improving however we have continuous surface layer epithelial loss posteriorly. There is no overt cellulitis in the amount of edema in both legs is really quite good. He states he is using his compression pumps on the left leg once a day for 5 out of 7 days 12/01/17; very small superficial areas on the right lateral leg continue to improve. Edema control in both legs is better today. He has continued loss of surface epithelialization and left posterior calf although I think this is better. We have been using silver alginate with large number of absorptive secondary dressings 4 layer on the left Unna boot on the right at his request. He tells me he is using his  compression pumps once a day 12/08/17; he has no open area on the right leg is edema control is good here. ooOn the left leg however he has marked erythema and tenderness breakdown of skin. He has what appears to be a wrap injury just distal to the popliteal fossa. 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 There was swelling erythema and breakdown in the right leg. He has much worse skin breakdown in the left leg as well multiple open areas  medially and posteriorly as well as laterally. He tells me he has been using his compression pumps but tells me he feels that the drainage out of his leg is worse when he uses a compression pumps. To be fair to him he is been saying this for a while however I don't know that I have really been listening to this. I wonder if the compression pumps are working properly 12/22/17;. Once again he arrives with severe erythema, weeping edema from the left greater than right leg. Noncompliance with compression pumps. New this visit he is complaining of pain on the lateral aspect of the right leg and the medial aspect of his right thigh. He apparently saw his cardiologist Dr. [name] who was ordered an echocardiogram area and I think this is a step in the right direction 12/25/17; started his doxycycline Monday night. There is still intense erythema of the right leg especially in the anterior thigh although there is less tenderness. The erythema around the wound on the right lateral calf also is less tender. He still complaining of pain in the left heel. His wounds are about the same right lateral left medial left lateral. Superficial but certainly not close to closure. He denies being systemically  unwell no fever chills no abdominal pain no diarrhea 12/29/17; back in follow-up of his extensive right calf and right thigh cellulitis. I added amoxicillin to cover possible doxycycline resistant strep. This seems to of done the trick he is in much less pain there is much less erythema and swelling. He has his echocardiogram at 11:00 this morning. X-ray of the left heel was also negative. 01/05/18; the patient arrived with his edema under much better control. Now that he is retired he is able to use his compression pumps daily and sometimes twice a day per the patient. He has a wound on the right leg the lateral wound looks better. Area on the left leg also looks a lot better. He has no evidence of cellulitis in his bilateral thighs I had a quick peak at his echocardiogram. He is in normal ejection fraction and normal left ventricular function. He has moderate pulmonary hypertension moderately reduced right ventricular function. One would have to wonder about chronic sleep apnea although he says he doesn't snore. He'll review the echocardiogram with his cardiologist. 01/12/18; the patient arrives with the edema in both legs under exemplary control. He is using his compression pumps daily and sometimes twice daily. His wound on the right lateral leg is just about closed. He still has some weeping areas on the posterior left calf and lateral left calf although everything is just about closed here as well. I have spoken with Aldean Baker who is the patient's nurse practitioner and infectious disease. She was concerned that the patient had not understood that the parenteral penicillin injections he was receiving for cellulitis prophylaxis was actually benefiting him. I don't think the patient actually saw that I would tend to agree we were certainly dealing with less infections although he had a serious one last month. 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 He is  healed. He'll be placed in juxtalite compression wraps and increase his lymphedema pumps to twice daily. We will follow up again next week to ensure there are no issues with the new regiment. 01/20/18-he is here for evaluation of bilateral lower extremity weeping edema. Yesterday he was placed in compression wrap to the right lower extremity and compression stocking to left lower shrubbery.  He states he uses lymphedema pumps last night and again this morning and noted a blister to the left lower extremity. On exam he was noted to have drainage to the right lower extremity. He will be placed in Unna boots bilaterally and follow-up next week 01/26/18; patient was actually discharged a week ago to his own juxta light stockings only to return the next day with bilateral lower extremity weeping edema.he was placed in bilateral Unna boots. He arrives today with pain in the back of his left leg. There is no open area on the right leg however there is a linear/wrap injury on the left leg and weeping edema on the left leg posteriorly. I spoke with infectious disease about 10 days ago. They were disappointed that the patient elected to discontinue prophylactic intramuscular penicillin shots as they felt it was particularly beneficial in reducing the frequency of his cellulitis. I discussed this with the patient today. He does not share this view. He'll definitely need antibiotics today. Finally he is traveling to [place] and trauma leaving this Saturday and returning a week later and he does not travel with his pumps. He is going by car 01/30/18; patient was seen 4 days ago and brought back in today for review of cellulitis in the left leg posteriorly. I put him on amoxicillin this really hasn't helped as much as I might like. He is also worried because he is traveling to [facility] trauma by car. Finally we will be rewrapping him. There is no open area on the right leg over his left leg has multiple weeping  areas as usual 02/09/18; The same wrap on for 10 days. He did not pick up the last doxycycline I prescribed for him. He apparently took 4 days worth he already had. There is nothing open on his right leg and the edema control is really quite good. He's had damage in the left leg medially and laterally especially probably related to the prolonged use of Unna boots 02/12/18; the patient arrived in clinic today for a nurse visit/wrap change. He complained of a lot of pain in the left posterior calf. He is taking doxycycline that I previously prescribed for him. Unfortunately even though he used his stockings and apparently used to compression pumps twice a day he has weeping edema coming out of the lateral part of his right leg. This is coming from the lower anterior lateral skin area. 02/16/18; the patient has finished his doxycycline and will finish the amoxicillin 2 days. The area of cellulitis in the left calf posteriorly has resolved. He is no longer having any pain. He tells me he is using his compression pumps at least once a day sometimes twice. 02/23/18; the patient finished his doxycycline and Amoxil last week. On Friday he noticed a small erythematous circle about the size of a quarter on the left lower leg just above his ankle. This rapidly expanded and he now has erythema on the lateral and posterior part of the thigh. This is bright red. Also has an area on the dorsal foot just above his toes and a tender area just below the left popliteal fossa. He came off his prophylactic penicillin injections at his own insistence one or 2 months ago. This is obviously deteriorated since then 03/02/18; patient is on doxycycline and Amoxil. Culture I did last week of the weeping area on the back of his left calf grew group B strep. I have therefore renewed the amoxicillin 500 3 times a day for a  further week. He has not been systemically unwell. Still complaining of an area of discomfort right under his  left popliteal fossa. There is no open wound on the right leg. He tells me that he is using his pumps twice a day on most days 03/09/18; patient arrives in clinic today completing his amoxicillin today. The cellulitis on his left leg is better. Furthermore he tells me that he had intramuscular penicillin shots that his primary care office today. However he also states that the wrap on his right leg fell down shortly after leaving clinic last week. He developed a large blister that was present when he came in for a nurse visit later in the week and then he developed intense discomfort around this area.He tells me he is using his compression pumps 03/16/18; the patient has completed his doxycycline. The infectious part of this/cellulitis in the left heel area left popliteal area is a lot better. He has 2 open areas on the right calf. Still areas on the left calf but this is a lot better as well. 03/24/18; the patient arrives complaining of pain in the left popliteal area again. He thinks some of this is wrap injury. He has no open area on the right leg and really no open area on the left calf either except for the popliteal area. He claims to be compliant with the compression pumps 03/31/18; I gave him doxycycline last week because of cellulitis in the left popliteal area. This is a lot better although the surface epithelium is denuded off and response to this. He arrives today with uncontrolled edema in the right calf area as well as a fingernail injury in the right lateral calf. There is only a few open areas on the left 04/06/18; I gave him amoxicillin doxycycline over the last 2 weeks that the amoxicillin should be completing currently. He is not complaining of any pain or systemic symptoms. The only open areas see has is on the right lateral lower leg paradoxically I cannot see anything on the left lower leg. He tells me he is using his compression pumps twice a day on most days. Silver alginate to  the wounds that are open under 4 layer compression 04/13/18; he completed antibiotics and has no new complaints. Using his compression pumps. Silver alginate that anything that's opened 04/20/18; he is using his compression pumps religiously. Silver alginate 4 layer compression anything that's opened. He comes in today with no open wounds on the left leg but 3 on the right including a new one posteriorly. He has 2 on the right lateral and one on the right posterior. He likes Unna boots on the right leg for reasons that aren't really clear we had the usual 4 layer compression on the left. It may be necessary to move to the 4 layer compression on the right however for now I left them in the Unna boots 04/27/18; he is using his compression pumps at least once a day. He has still the wounds on the right lateral calf. The area right posteriorly has closed. He does not have an open wound on the left under 4 layer compression however on the dorsal left foot just proximal to the toes and the left third toe 2 small open areas were identified 05/11/18; he has not uses compression pumps. The areas on the right lateral calf have coalesced into one large wound necrotic surface. On the left side he has one small wound anteriorly however the edema is now weeping out  of a large part of his left leg. He says he wasn't using his pumps because of the weeping fluid. I explained to him that this is the time he needs to pump more 05/18/18; patient states he is using his compression pumps twice a day. The area on the right lateral large wound albeit superficial. On the left side he has innumerable number of small new wounds on the left calf particularly laterally but several anteriorly and medially. All these appear to have healthy granulated base these look like the remnants of blisters however they occurred under compression. The patient arrives in clinic today with his legs somewhat better. There is certainly less edema,  less multiple open areas on the left calf and the right anterior leg looks somewhat better as well superficial and a little smaller. However he relates pain and erythema over the last 3-4 days in the thigh and I looked at this today. He has not been systemically unwell no fever no chills no change in blood sugar values 05/25/18; comes in today in a better state. The severe cellulitis on his left leg seems better with the Keflex. Not as tender. He has not been systemically unwell ooHard to find an open wound on the left lower leg using his compression pumps twice a day ooThe confluent wounds on his right lateral calf somewhat better looking. These will ultimately need debridement I didn't do this today. 06/01/18; the severe cellulitis on the left anterior thigh has resolved and he is completed his Keflex. ooThere is no open wound on the left leg however there is a superficial excoriation at the base of the third toe dorsally. Skin on the bottom of his left foot is macerated looking. ooThe left the wounds on the lateral right leg actually looks some better although he did require debridement of the top half of this wound area with an open curet 06/09/18 on evaluation today patient appears to be doing poorly in regard to his right lower extremity in particular this appears to likely be infected he has very thick purulent discharge along with a bright green tent to the discharge. This makes me concerned about the possibility of pseudomonas. He's also having increased discomfort at this point on evaluation. Fortunately there does not appear to be any evidence of infection spreading to the other location at this time. 06/16/18 on evaluation today patient appears to actually be doing fairly well. His ulcer has actually diminished in size quite significantly at this point which is good news. Nonetheless he still does have some evidence of infection he did see infectious disease this morning before coming  here for his appointment. I did review the results of their evaluation and their note today. They did actually have him discontinue the Cipro and initiate [name] with linezolid at this time. He is doing this for the next seven days and they recommended a follow-up in four months with them. He is the keep a log of the need for intermittent antibiotic therapy between now and when he falls back up with infectious disease. This will help them gaze what exactly they need to do to try and help them out. 06/23/18; the patient arrives today with no open wounds on the left leg and left third toe healed. He is been using his compression pumps twice a day. On the right lateral leg he still has a sizable wound but this is a lot better than last time I saw this. In my absence he apparently cultured MRSA coming  from this wound and is completed a course of linezolid as has been directed by infectious disease. Has been using silver alginate under 4 layer compression 06/30/18; the only open wound he has is on the right lateral leg and this looks healthy. No debridement is required. We have been using silver alginate. He does not have an open wound on the left leg. There is apparently some drainage from the dorsal proximal third toe on the left although I see no open wound here. 07/03/18 on evaluation today patient was actually here just for a nurse visit rapid change. However when he was here on Wednesday for his rat change due to having been healed on the left and then developing blisters we initiated the wrap again knowing that he would be back today for [place] to reevaluate and see were at. Unfortunately he has developed some cellulitis into the proximal portion of his right lower extremity even into the region of his thigh. 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 He was placed on one as what at that point. Nonetheless he is done with that and has been tolerating it well  otherwise. Doxycycline which in the past really did not seem to be effective for him. Nonetheless I think the best option may be for [place] to definitely reinitiate the antibiotics for a longer period of time. 07/07/18; since I last saw this patient a week ago he has had a difficult time. At that point he did not have an open wound on his left leg. We transitioned him into juxta light stockings. He was apparently in the clinic the next day with blisters on the left lateral and left medial lower calf. He also had weeping edema fluid. He was put back into a compression wrap. He was also in the clinic on Friday with intense erythema in his right thigh. Per the patient he was started on Bactrim however that didn't work at all in terms of relieving his pain and swelling. He has taken 3 doxycycline that he had left over from last time and that seems to of helped. He has blistering on the right thigh as well. 07/14/18; the erythema on his right thigh has gotten better with doxycycline that he is finishing. 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 Presumably this cellulitis in the thigh was not related to that although I think this is a bit concerning going forward. He still has an area on the right lateral calf the blister on the right medial calf just below the knee that was discussed above. On the left 2 small open areas left medial and left lateral. Edema control is adequate. He is using his compression pumps twice a day 07/20/18; continued improvement in the condition of both legs especially the edema in his bilateral thighs. He tells me he is been losing weight through a combination of diet and exercise. He is using his compression pumps twice a day. So overall she made to the remaining wounds 07/27/2018; continued improvement in condition of both legs. His edema is well controlled. The area on the right lateral leg is just about  closed he had one blisters show up on the medial left upper calf. We have him in 4 layer compression. He is going on a 10-day trip to [place], [place] and [place]. He will be driving. He wants to wear Unna boots  because of the lessening amount of constriction. He will not use compression pumps while he is away 08/05/18 on evaluation today patient actually appears to be doing decently well all things considered in regard to his bilateral lower extremities. The worst ulcer is actually only posterior aspect of his left lower extremity with a four layer compression wrap cut into his leg a couple weeks back. He did have a trip and actually had [employer] for the trip that he is worn since he was last here. Nonetheless he feels like the [employer] actually do better for him his swelling is up a little bit but he also with his trip was not taking his Lasix on a regular set schedule like he was supposed to be. He states that obviously the reason being that he cannot drive and keep going without having to urinate too frequently which makes it difficult. He did not have his pumps with him while he was away either which I think also maybe playing a role here too. 08/13/2018; the patient only has a small open wound on the right lateral calf which is a big improvement in the last month or 2. He also has the area posteriorly just below the posterior fossa on the left which I think was a wrap injury from several weeks ago. He has no current evidence of cellulitis. He tells me he is back into his compression pumps twice a day. He also tells me that while he was at the laundromat somebody stole a section of his extremitease stockings 08/20/2018; back in the clinic with a much improved state. He only has small areas on the right lateral mid calf which is just about healed. This was is more substantial area for quite a prolonged period of time. He has a small open area on the left anterior tibia. The  area on the posterior calf just below the popliteal fossa is closed today. He is using his compression pumps twice a day 08/28/2018; patient has no open wound on the right leg. He has a smattering of open areas on the calf with some weeping lymphedema. More problematically than that it looks as though his wraps of slipped down in his usual he has very angry upper area of edema just below the right medial knee and on the right lateral calf. He has no open area on his feet. The patient is traveling to [facility] next week. I will send him in an antibiotic. We will continue to wrap the right leg. We ordered extremitease stockings for him last week and I plan to transition the right leg to a stocking when he gets home which will be in 10 days time. As usual he is very reluctant to take his pumps with him when he travels 09/07/2018; patient returns from [facility]. He shows me a picture of his left leg in the mid part of his trip last week with intense fire engine erythema. The picture look bad enough I would have considered sending him to the hospital. Instead he went to the wound care center in [place]. They did not prescribe him antibiotics but he did take some doxycycline he had leftover from a previous visit. I had given him trimethoprim sulfamethoxazole before he left this did not work according to the patient. This is resulted in some improvement fortunately. He comes back with a large wound on the left posterior calf. Smaller area on the left anterior tibia. Denuded blisters on the dorsal  left foot over his toes. Does not have much in the way of wounds on the right leg although he does have a very tender area on the right posterior area just below the popliteal fossa also suggestive of infection. He promises me he is back on his pumps twice a day 09/15/2018; the intense cellulitis in his left lower calf is a lot better. The wound area on the posterior left calf is also so  better. However he has reasonably extensive wounds on the dorsal aspect of his second and third toes and the proximal foot just at the base of the toes. There is nothing open on the right leg 09/22/2018; the patient has excellent edema control in his legs bilaterally. He is using his external compression pumps twice a day. He has no open area on the right leg and only the areas in the left foot dorsally second and third toe area on the left side. He does not have any signs of active cellulitis. 10/06/2018; the patient has good edema control bilaterally. He has no open wound on the right leg. There is a blister in the posterior aspect of his left calf that we had to deal with today. He is using his compression pumps twice a day. There is no signs of active cellulitis. We have been using silver alginate to the wound areas. He still has vulnerable areas on the base of his left first second toes dorsally He has a his extremities stockings and we are going to transition him today into the stocking on the right leg. He is cautioned that he will need to continue to use the compression pumps twice a day. If he notices uncontrolled edema in the right leg he may need to go to 3 times a day. 10/13/2018; the patient came in for a nurse check on Friday he has a large flaccid blister on the right medial calf just below the knee. We unroofed this. He has this and a new area underneath the posterior mid calf which was undoubtedly a blister as well. He also has several small areas on the right which is the area we put his extremities stocking on. 10/19/2018; the patient went to see infectious disease this morning I am not sure if that was a routine follow-up in any case the doxycycline I had given him was discontinued and started on linezolid. He has not started this. It is easy to look at his left calf and the inflammation and think this is cellulitis however he is very tender in the tissue just below the  popliteal fossa and I have no doubt that there is infection going on here. He states the problem he is having is that with the compression pumps the edema goes down and then starts walking the wrap falls down. We will see if we can adhere this. He has 1 or 2 minuscule open areas on the right still areas that are weeping on the posterior left calf, the base of his left second and third toes 10/26/18; back today in clinic with quite of skin breakdown in his left anterior leg. This may have been infection the area below the popliteal fossa seems a lot better however tremendous epithelial loss on the left anterior mid tibia area over quite inexpensive tissue. He has 2 blisters on the right side but no other open wound here. 10/29/2018; came in urgently to see [place] today and we worked him in for review. He states that the 4 layer compression on the  right leg caused pain he had to cut it down to roughly his mid calf this caused swelling above the wrap and he has blisters and skin breakdown today. As a result of the pain he has not been using his pumps. Both legs are a lot more edematous and there is a lot of weeping fluid. 11/02/18; arrives in clinic with continued difficulties in the right leg> left. Leg is swollen and painful. multiple skin blisters and new open areas especially laterally. He has not been using his pumps on the right leg. 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 He is not systemically unwell. 11/09/2018; the patient claims he is being compliant with his pumps. He is finished the doxycycline I gave him last week. Culture I did of the wound on the right lateral leg showed a few very resistant methicillin staph aureus. This was resistant to doxycycline. Nevertheless he states the pain in the leg is a lot better which makes me wonder if the cultured organism was not really what was causing the problem nevertheless this is a very dangerous  organism to be culturing out of any wound. His right leg is still a lot larger than the left. He is using an Radio broadcast assistant on this area, he blames a 4-layer compression for causing the original skin breakdown which I doubt is true however I cannot talk him out of it. We have been using silver alginate to all of these areas which were initially blisters 11/16/2018; patient is being compliant with his external compression pumps at twice a day. Miraculously he arrives in clinic today with absolutely no open wounds. He has better edema control on the left where he has been using 4 layer compression versus wound of wounds on the right and I pointed this out to him. There is no inflammation in the skin in his lower legs which is also somewhat unusual for him. There is no open wounds on the dorsal left foot. He has extremitease stockings at home and I have asked him to bring these in next week. 11/25/18 patient's lower extremity on examination today on the left appears for the most part to be wound free. He does have an open wound on the lateral aspect of the right lower extremity but this is minimal compared to what I've seen in past. He does request that we go ahead and wrap the left leg as well even though there's nothing open just so hopefully it will not reopen in short order. 1/28; patient has superficial open wounds on the right lateral calf left anterior calf and left posterior calf. His edema control is adequate. 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 We have been using silver alginate as the primary dressing. He claims compliance with his compression pumps 2/4; patient has superficial open wounds on numerous areas of his left calf and again one on the left dorsal foot. The areas on the right lateral calf have healed. The cellulitis that I gave him doxycycline for last week is also resolved this was mostly on  the left anterior calf just below the tibial tuberosity. His edema looks fairly well-controlled. He tells me he went to see his primary doctor today and had blood work ordered 2/11; once again he has several open areas on the left calf left tibial area. Most of these are small and appear to have healthy granulation.  He does not have anything open on the right. The edema and control in his thighs is pretty good which is usually a good indication he has been using his pumps as requested. 2/18; he continues to have several small areas on the left calf and left tibial area. Most of these are small healthy granulation. We put him in his stocking on the right leg last week and he arrives with a superficial open area over the right upper tibia and a fairly large area on the right lateral tibia in similar condition. His edema control actually does not look too bad, he claims to be using his compression pumps twice a day 2/25. Continued small areas on the left calf and left tibial area. New areas especially on the right are identified just below the tibial tuberosity and on the right upper tibia itself. There are also areas of weeping edema fluid even without an obvious wound. He does not have a considerable degree of lymphedema but clearly there is more edema here than his skin can handle. He states he is using the pumps twice a day. We have an Unna boot on the right and 4 layer compression on the left. 3/3; he continues to have an area on the right lateral calf and right posterior calf just below the popliteal fossa. There is a fair amount of tenderness around the wound on the popliteal fossa but I did not see any evidence of cellulitis, could just be that the wrap came down and rubbed in this area. ooHe does not have an open area on the left leg however there is an area on the left dorsal foot at the base of the third toe ooWe have been using silver alginate to all wound areas 3/10; he did not have  an open area on his left leg last time he was here a week ago. Today he arrives with a horizontal wound just below the tibial tuberosity and an area on the left lateral calf. He has intense erythema and tenderness in this area. The area is on the right lateral calf and right posterior calf better than last week. We have been using silver alginate as usual 3/18 - Patient returns with 3 small open areas on left calf, and 1 small open area on right calf, the skin looks ok with no significant erythema, he continues the UNA boot on right and 4 layer compression on left. The right lateral calf wound is closed , the right posterior is small area. we will continue silver alginate to the areas. 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 He is been tolerating the dressing changes without complication. Fortunately she has made excellent improvement in regard to the overall status of his wounds. Unfortunately every time we cease wrapping him he ends up reopening in causing more significant issues at that point. Again I'm unsure of the best direction to take although I think the lymphedema clinic may be appropriate for him. 02/03/19 on evaluation today patient appears to be doing well in regard to the wounds that we saw him for last week unfortunately he has a new area on the proximal portion of his right medial/posterior lower extremity where the wrap somewhat slowed down and caused swelling and a blister to rub and open. Unfortunately this is the only opening that  he has on either leg at this point. 02/17/19 on evaluation today patient's bilateral lower extremities appear to be doing well. He still completely healed in regard to the left lower extremity. In regard to the right lower extremity the area where the wrap  and slid down and caused the blister still seems to be slightly open although this is dramatically better than during the last evaluation two weeks ago. I'm very pleased with the way this stands overall. 03/03/19 on evaluation today patient appears to be doing well in regard to his right lower extremity in general although he did have a new blister open this does not appear to be showing any evidence of active infection at this time. Fortunately there's No fevers, chills, nausea, or vomiting noted at this time. Overall I feel like he is making good progress it does feel like that the right leg will we perform the [employer] seems to do with a bit better than three layer wrap on the left which slid down on him. We may switch to doing bilateral in the book wraps. 5/4; I have not seen Mr. Vaile in quite some time. According to our case manager he did not have an open wound on his left leg last week. He had 1 remaining wound on the right posterior medial calf. He arrives today with multiple openings on the left leg probably were blisters and/or wrap injuries from Unna boots. I do not think the Unna boot's will provide adequate compression on the left. I am also not clear about the frequency he is using the compression pumps. 03/17/19 on evaluation today patient appears to be doing excellent in regard to his lower extremities compared to last week's evaluation apparently. He had gotten significantly worse last week which is unfortunate. The [employer] wrap on the left did not seem to do very well for him at all and in fact it didn't control his swelling significantly enough he had an additional outbreak. Subsequently we go back to the four layer compression wrap on the left. This is good news. At least in that he is doing better and the wound seem to be killing him. He still has not heard anything from the lymphedema clinic. 03/24/19 on evaluation today patient actually appears to be doing much better in  regard to his bilateral lower Trinity as compared to last week when I saw him. Fortunately there's no signs of active infection at this time. He has been tolerating the dressing changes without complication. Overall I'm extremely pleased with the progress and appearance in general. 04/07/19 on evaluation today patient appears to be doing well in regard to his bilateral lower extremities. His swelling is significantly down from where it was previous. With that being said he does have a couple blisters still open at this point but fortunately nothing that seems to be too severe and again the majority of the larger openings has healed at this time. 04/14/19 on evaluation today patient actually appears to be doing quite well in regard to his bilateral lower extremities in fact I'm not even sure there's anything significantly open at this time at any site. Nonetheless he did have some trouble with these wraps where they are somewhat irritating him secondary to the fact that he has noted that the graph wasn't too close down to the end of this foot in a little bit short as well up to his knee. Otherwise things seem to be doing quite well. 04/21/19 upon evaluation today patient's wound  bed actually showed evidence of being completely healed in regard to both lower extremities which is excellent news. There does not appear to be any signs of active infection which is also good news. I'm very pleased in this regard. No fevers, chills, nausea, or vomiting noted at this time. 04/28/19 on evaluation today patient appears to be doing a little bit worse in regard to both lower extremities on the left mainly due to the fact that when he went infection disease the wrap was not wrapped quite high enough he developed a blister above this. On the right he is a small open area of nothing too significant but again this is continuing to give him some trouble he has been were in the Velcro compression that he has at  home. 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 He's been tolerating the dressing changes without complication. Fortunately there's no signs of active infection at this time. No fevers, chills, nausea, or vomiting noted at this time. We have been trying to get an appointment with her lymphedema clinic in [facility] but unfortunately nobody can get them on phone with not been able to even fax information over the patient likewise is not been able to get in touch with them. Overall I'm not sure exactly what's going on here with to reach out again today. 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 Still having a lot of drainage unfortunately. He tells me especially in the left but even on the right. There's no signs of active infection which is good news we've been using so ratcheted up to this point. 05/19/19 on evaluation today patient actually appears to be doing quite well with regard to his left lower extremity which is great news. Fortunately in regard to the right lower extremity has an issues with his wrap and he subsequently did remove this from what I'm understanding. Nonetheless long story short is what he had rewrapped once he removed it subsequently had maggots underneath this wrap whenever he came in for evaluation today. With that being said they were obviously completely cleaned away by the nursing staff. The visit today which is excellent news. However he does appear to potentially have some infection around the right ankle region where the maggots were located as well. He will likely require anabiotic therapy today. 05/26/19 on evaluation today patient actually appears to be doing much better in regard to his bilateral lower extremities. I feel like the infection is under much better control. With that being said there were  maggots noted when the wrap was removed yet again today. Again this could have potentially been left over from previous although at this time there does not appear to be any signs of significant drainage there was obviously on the wrap some drainage as well this contracted gnats or otherwise. Either way I do not see anything that appears to be doing worse in my pinion and in fact I think his drainage has slowed down quite significantly likely mainly due to the fact to his infection being under better control. 06/02/2019 on evaluation today patient actually appears to be doing well with regard to his bilateral lower extremities there is no signs of active infection at this time which is great news. With that being said he does have several open areas more so on the right than the left but nonetheless  these are all significantly better than previously noted. 06/09/2019 on evaluation today patient actually appears to be doing well. His wrap stayed up and he did not cause any problems he had more drainage on the right compared to the left but overall I do not see any major issues at this time which is great news. 06/16/2019 on evaluation today patient appears to be doing excellent with regard to his lower extremities the only area that is open is a new blister that can have opened as of today on the medial ankle on the left. Other than this he really seems to be doing great I see no major issues at this point. 06/23/2019 on evaluation today patient appears to be doing quite well with regard to his bilateral lower extremities. In fact he actually appears to be almost completely healed there is a small area of weeping noted of the right lower extremity just above the ankle. Nonetheless fortunately there is no signs of active infection at this time which is good news. No fevers, chills, nausea, vomiting, or diarrhea. 8/24; the patient arrived for a nurse visit today but complained of very significant pain in  the left leg and therefore I was asked to look at this. Noted that he did not have an open area on the left leg last week nevertheless this was wrapped. The patient states that he is not been able to put his compression pumps on the left leg because of the discomfort. He has not been systemically unwell 06/30/2019 on evaluation today patient unfortunately despite being excellent last week is doing much worse with regard to his left lower extremity today. In fact he had to come in for a nurse on Monday where his left leg had to be rewrapped due to excessive weeping Dr. Leanord Hawking placed him on doxycycline at that point. Fortunately there is no signs of active infection Systemically at this time which is good news. 07/07/2019 in regard to the patient's wounds today he actually seems to be doing well with his right lower extremity there really is nothing open or draining at this point this is great news. Unfortunately the left lower extremity is given him additional trouble at this time. There does not appear to be any signs of active infection nonetheless he does have a lot of edema and swelling noted at this point as well as blistering all of which has led to a much more poor appearing leg at this time compared to where it was 2 weeks ago when it was almost completely healed. Obviously this is a little discouraging for the patient. He is try to contact the lymphedema clinic in [place] he has not been able to get through to them. 07/14/2019 on evaluation today patient actually appears to be doing slightly better with regard to his left lower extremity ulcers. Overall I do feel like at least at the top of the wrap that we have been placing this area has healed quite nicely and looks much better. The remainder of the leg is showing signs of improvement. Unfortunately in the thigh area he still has an open region on the left and again on the right he has been utilizing just a Band-Aid on an area that also  opened on the thigh. Again this is an area that were not able to wrap although we did do an Ace wrap to provide some compression that something that obviously is a little less effective than the compression wraps we have been using on the  lower portion of the leg. He does have an appointment with the lymphedema clinic in [name] on Friday. 07/21/2019 on evaluation today patient appears to be doing better with regard to his lower extremity ulcers. He has been tolerating the dressing changes without complication. Fortunately there is no signs of active infection at this time. No fevers, chills, nausea, vomiting, or diarrhea. I did receive the paperwork from the physical therapist at the lymphedema clinic in [place]. Subsequently I signed off on that this morning and sent that back to him for further progression with the [name] plan. 07/28/2019 on evaluation today patient appears to be doing very well with regard to his right lower extremity where I do not see any open wounds at this point. Fortunately he is feeling great as far as that is concerned as well. In regard to the left lower extremity he has been having issues with still several areas of weeping and edema although the upper leg is doing better his lower leg still I think is going require the compression wrap at this time. No fevers, chills, nausea, vomiting, or diarrhea. 08/04/2019 on evaluation today patient unfortunately is having new wounds on the right lower extremity. Again we have been using Unna boot wrap on that side. We switched him to using his juxta lite wrap at home. With that being said he tells me he has been using it although his legs extremely swollen and to be honest really does not appear that he has been. I cannot know that for sure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

## 2019-10-13 NOTE — Progress Notes (Signed)
ZYGMUNT, MCGLINN ([id number]) Visit Report for 09/07/2019 HPI Details Patient Name: Date of Service: [name], [name] 09/07/2019 2:30 PM Medical Record [id number] Patient Account Number: [id number] Date of Birth/Sex: Treating RN: 1951-03-12 (68 y.o. Judie Petit) [name] Primary Care Provider: Nicoletta Ba Other Clinician: Referring Provider: Treating Provider/Extender:Chrsitopher Wik, [name], [name] Weeks in Treatment: 189 History of Present Illness HPI Description: Referred by PCP for consultation. Patient has long standing history of BLE venous stasis, no prior ulcerations. At beginning of month, developed cellulitis and weeping. Received IM Rocephin followed by Keflex and resolved. Wears compression stocking, appr 6 months old. Not sure strength. No present drainage. 01/22/16 this is a patient who is a type II diabetic on insulin. 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 He tells me he religiously wears pressure stockings of uncertain strength. He was here with weeping edema about 8 months ago but did not have an open wound. Roughly a month ago he had a reopening on his bilateral legs. He is been using bandages and Neosporin. He does not complain of pain. 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 Last BUN/creatinine I have is from 11/20/15 at 13 and 1.0 respectively 01/29/16; patient arrives today having tolerated the Profore wrap. He brought in his stockings and these are 18 mmHg stockings he bought from [place]. The compression here is likely inadequate. He does not complain of pain or excessive drainage she has no systemic symptoms. The wound on the right looks improved as does the one on the left although one on the left is more substantial with still tissue at risk below the actual wound area on the bilateral posterior calf 02/05/16; patient  arrives with poor edema control. He states that we did put a 4 layer compression on it last week. No weight appear 5 this. 02/12/16; the area on the posterior right Has healed. The left Has a substantial wound that has necrotic surface eschar that requires a debridement with a curette. 02/16/16;the patient called or a Nurse visit secondary to increased swelling. He had been in earlier in the week with his right leg healed. He was transitioned to is on pressure stocking on the right leg with the only open wound on the left, a substantial area on the left posterior calf. Note he has a history of severe lower extremity edema, he has a history of chronic atrial fibrillation but not heart failure per my notes but I'll need to research this. He is not complaining of chest pain shortness of breath or orthopnea. The intake nurse noted blisters on the previously closed right leg 02/19/16; this is the patient's regular visit day. I see him on Friday with escalating edema new wounds on the right leg and clear signs of at least right ventricular heart failure. I increased his Lasix to 40 twice a day. He is returning currently in follow-up. States he is noticed a decrease in that the edema 02/26/16 patient's legs have much less edema. There is nothing really open on the right leg. The left leg has improved condition of the large superficial wound on the posterior left leg 03/04/16; edema control is very much better. The patient's right leg wounds have healed. On the left leg he continues to have severe venous inflammation on the posterior aspect of the left leg. There is no tenderness and I don't think any of this is cellulitis.  03/11/16; patient's right leg is married healed and he is in his own stocking. The patient's left leg has deteriorated somewhat. There is a lot of erythema around the wound on the posterior left leg. There is also a significant rim of erythema posteriorly just above where the wrap would've  ended there is a new wound in this location and a lot of tenderness. Can't rule out cellulitis in this area. 03/15/16; patient's right leg remains healed and he is in his own stocking. The patient's left leg is much better than last review. His major wound on the posterior aspect of his left Is almost fully epithelialized. He has 3 small injuries from the wraps. Really. Erythema seems a lot better on antibiotics 03/18/16; right leg remains healed and he is in his own stocking. The patient's left leg is much better. The area on the posterior aspect of the left calf is fully epithelialized. His 3 small injuries which were wrap injuries on the left are improved only one seems still open his erythema has resolved 03/25/16; patient's right leg remains healed and he is in his own stocking. There is no open area today on the left leg posterior leg is completely closed up. His wrap injuries at the superior aspect of his leg are also resolved. He looks as though he has some irritation on the dorsal ankle but this is fully epithelialized without evidence of infection. 03/28/16; we discharged this patient on Monday. Transitioned him into his own stocking. There were problems almost immediately with uncontrolled swelling weeping edema multiple some of which have opened. He does not feel systemically unwell in particular no chest pain no shortness of breath and he does not feel 04/08/16; the edema is under better control with the Profore light wrap but he still has pitting edema. There is one large wound anteriorly 2 on the medial aspect of his left leg and 3 small areas on the superior posterior calf. Drainage is not excessive he is tolerating a Profore light well 04/15/16; put a Profore wrap on him last week. This is controlled is edema however he had a lot of pain on his left anterior foot most of his wounds are healed 04/22/16 once again the patient has denuded areas on the left anterior foot which he states are  because his wrap slips up word. He saw his primary physician today is on Lasix 40 twice a day and states that he his weight is down 20 pounds over the last 3 months. 04/29/16: Much improved. left anterior foot much improved. He is now on Lasix 80 mg per day. Much improved edema control 05/06/16; I was hoping to be able to discharge him today however once again he has blisters at a low level of where the compression was placed last week mostly on his left lateral but also his left medial leg and a small area on the anterior part of the left foot. 05/09/16; apparently the patient went home after his appointment on 7/4 later in the evening developing pain in his upper medial thigh together with subjective fever and chills although his temperature was not taken. The pain was so intense he felt he would probably have to call 911. However he then remembered that he had leftover doxycycline from a previous round of antibiotics and took these. By the next morning he felt a lot better. He called and spoke to one of our nurses and I approved doxycycline over the phone thinking that this was in relation to the  wounds we had previously seen although they were definitely were not. The patient feels a lot better old fever no chills he is still working. Blood sugars are reasonably controlled 05/13/16; patient is back in for review of his cellulitis on his anterior medial upper thigh. He is taking doxycycline this is a lot better. Culture I did of the nodular area on the dorsal aspect of his foot grew MRSA this also looks a lot better. 05/20/16; the patient is cellulitis on the medial upper thigh has resolved. All of his wound areas including the left anterior foot, areas on the medial aspect of the left calf and the lateral aspect of the calf at all resolved. He has a new blister on the left dorsal foot at the level of the fourth toe this was excised. No evidence of infection 05/27/16; patient continues to complain  weeping edema. He has new blisterlike wounds on the left anterior lateral and posterior lateral calf at the top of his wrap levels. The area on his left anterior foot appears better. He is not complaining of fever, pain or pruritus in his feet. 05/30/16; the patient's blisters on his left anterior leg posterior calf all look improved. He did not increase the Lasix 100 mg as I suggested because he was going to run out of his 40 mg tablets. He is still having weeping edema of his toes 06/03/16; I renewed his Lasix at 80 mg once a day as he was about to run out when I last saw him. He is on 80 mg of Lasix now. I have asked him to cut down on the excessive amount of water he was drinking and asked him to drink according to his thirst mechanisms 06/12/2016 -- was seen 2 days ago and was supposed to wear his compression stockings at home but he is developed lymphedema and superficial blisters on the left lower extremity and hence came in for a review 06/24/16; the remaining wound is on his left anterior leg. He still has edema coming from between his toes. There is lymphedema here however his edema is generally better than when I last saw this. He has a history of atrial fibrillation but does not have a known history of congestive heart failure nevertheless I think he probably has this at least on a diastolic basis. 07/01/16 I reviewed his echocardiogram from January 2017. This was essentially normal. He did not have LVH, EF of 55-60%. His right ventricular function was normal although he did have trivial tricuspid and pulmonic regurgitation. This is not audible on exam however. I increased his Lasix to do massive edema in his legs well above his knees I think in early July. He was also drinking an excessive amount of water at the time. 07/15/16; missed his appointment last week because of the Labor Day holiday on Monday. He could not get another appointment later in the week. Started to feel the wrap  digging in superiorly so we remove the top half and the bottom half of his wrap. He has extensive erythema and blistering superiorly in the left leg. Very tender. Very swollen. Edema in his foot with leaking edema fluid. He has not been systemically unwell 07/22/16; the area on the left leg laterally required some debridement. The medial wounds look more stable. His wrap injury wounds appear to have healed. Edema and his foot is better, weeping edema is also better. He tells me he is meeting with the supplier of the external compression pumps at work 08/05/16;  the patient was on vacation last week in [facility]. His wrap is been on for an extended period of time. Also over the weekend he developed an extensive area of tender erythema across his anterior medial thigh. He took to doxycycline yesterday that he had leftover from a previous prescription. The patient complains of weeping edema coming out of his toes 08/08/16; I saw this patient on 10/2. He was tender across his anterior thigh. I put him on doxycycline. He returns today in follow-up. He does not have any open wounds on his lower leg, he still has edema weeping into his toes. 08/12/16; patient was seen back urgently today to follow-up for his extensive left thigh cellulitis/erysipelas. He comes back with a lot less swelling and erythema pain is much better. I believe I gave him Augmentin and Cipro. His wrap was cut down as he stated a roll down his legs. He developed blistering above the level of the wrap that remained. He has 2 open blisters and 1 intact. 08/19/16; patient is been doing his primary doctor who is increased his Lasix from 40-80 once a day or 80 already has less edema. Cellulitis has remained improved in the left thigh. 2 open areas on the posterior left calf 08/26/16; he returns today having new open blisters on the anterior part of his left leg. He has his compression pumps but is not yet been shown how to use some vital  representative from the supplier. 09/02/16 patient returns today with no open wounds on the left leg. Some maceration in his plantar toes 09/10/2016 -- Dr. Leanord Hawking had recently discharged him on 09/02/2016 and he has come right back with redness swelling and some open ulcers on his left lower extremity. He says this was caused by trying to apply his compression stockings and he's been unable to use this and has not been able to use his lymphedema pumps. He had some doxycycline leftover and he has started on this a few days ago. 09/16/16; there are no open wounds on his leg on the left and no evidence of cellulitis. 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 He does not complain of symptoms here. I am not clear use using his external compression pumps. 09/23/16; I have not seen this patient in 2 weeks. He canceled his appointment 10 days ago as he was going on vacation. He tells me that on Monday he noticed a large area on his posterior left leg which is been draining copiously and is reopened into a large wound. He is been using ABDs and the external part of his juxtalite, according to our nurse this was not on properly. 10/07/16; Still a substantial area on the posterior left leg. Using silver alginate 10/14/16; in general better although there is still open area which looks healthy. Still using silver alginate. He reminds me that this happen before he left for [facility]. Today while he was showering in the morning. He had been using his juxtalite's 10/21/16; the area on his posterior left leg is fully epithelialized. However he arrives today with a large area of tender erythema in his medial and posterior left thigh just above the knee. I have marked the area. Once again he is reluctant to consider hospitalization. I treated him with oral antibiotics in the past for a similar situation with resolution I think with doxycycline however  this area it seems more extensive to me. He is not complaining of fever  but does have chills and says states he is thirsty. His blood sugar today was in the 140s at home 10/25/16 the area on his posterior left leg is fully epithelialized although there is still some weeping edema. The large area of tenderness and erythema in his medial and posterior left thigh is a lot less tender although there is still a lot of swelling in this thigh. He states he feels a lot better. He is on doxycycline and Augmentin that I started last week. This will continued until Tuesday, December 26. I have ordered a duplex ultrasound of the left thigh rule out DVT whether there is an abscess something that would need to be drained I would also like to know. 11/01/16; he still has weeping edema from a not fully epithelialized area on his left posterior calf. Most of the rest of this looks a lot better. He has completed his antibiotics. His thigh is a lot better. Duplex ultrasound did not show a DVT in the thigh 11/08/16; he comes in today with more Denuded surface epithelium from the posterior aspect of his calf. There is no real evidence of cellulitis. The superior aspect of his wrap appears to have put quite an indentation in his leg just below the knee and this may have contributed. He does not complain of pain or fever. We have been using silver alginate as the primary dressing. The area of cellulitis in the right thigh has totally resolved. He has been using his compression stockings once a week 11/15/16; the patient arrives today with more loss of epithelium from the posterior aspect of his left calf. He now has a fairly substantial wound in this area. The reason behind this deterioration isn't exactly clear although his edema is not well controlled. He states he feels he is generally more swollen systemically. He is not complaining of chest pain shortness of breath fever. Tells me he has an appointment with his primary  physician in early February. He is on 80 mg of oral Lasix a day. He claims compliance with the external compression pumps. He is not having any pain in his legs similar to what he has with his recurrent cellulitis 11/22/16; the patient arrives a follow-up of his large area on his left lateral calf. This looks somewhat better today. He came in earlier in the week for a dressing change since I saw him a week ago. He is not complaining of any pain no shortness of breath no chest pain 11/28/16; the patient arrives for follow-up of his large area on the left lateral calf this does not look better. In fact it is larger weeping edema. The surface of the wound does not look too bad. We have been using silver alginate although I'm not certain that this is a dressing issue. 12/05/16; again the patient follows up for a large wound on the left lateral and left posterior calf this does not look better. There continues to be weeping edema necrotic surface tissue. More worrisome than this once again there is erythema below the wound involving the distal Achilles and heel suggestive of cellulitis. He is on his feet working most of the day of this is not going well. We are changing his dressing twice a week to facilitate the drainage. 12/12/16; not much change in the overall dimensions of the large area on the left posterior calf. This is very inflamed looking. I gave him an. Doxycycline last week does not really seem to have helped. He found the wrap  very painful indeed it seems to of dog into his legs superiorly and perhaps around the heel. He came in early today because the drainage had soaked through his dressings. 12/19/16- patient arrives for follow-up evaluation of his left lower extremity ulcers. He states that he is using his lymphedema pumps once daily when there is "no drainage". He admits to not using his lipedema pumps while under current treatment. His blood sugars have been consistently between  150-200. 12/26/16; the patient is not using his compression pumps at home because of the wetness on his feet. I've advised him that I think it's important for him to use this daily. He finds his feet too wet, he can put a plastic bag over his legs while he is in the pumps. Otherwise I think will be in a vicious circle. We are using silver alginate to the major area on his left posterior calf 01/02/17; the patient's posterior left leg has further of all into 3 open wounds. All of them covered with a necrotic surface. He claims to be using his compression pumps once a day. His edema control is marginal. Continue with silver alginate 01/10/17; the patient's left posterior leg actually looks somewhat better. There is less edema, less erythema. Still has 3 open areas covered with a necrotic surface requiring debridement. He claims to be using his compression pumps once a day his edema control is better 01/17/17; the patient's left posterior calf look better last week when I saw him and his wrap was changed 2 days ago. He has noted increasing pain in the left heel and arrives today with much larger wounds extensive erythema extending down into the entire heel area especially tender medially. He is not systemically unwell CBGs have been controlled no fever. Our intake nurse showed me limegreen drainage on his AVD pads. 01/24/17; his usual this patient responds nicely to antibiotics last week giving him Levaquin for presumed Pseudomonas. The whole entire posterior part of his leg is much better much less inflamed and in the case of his Achilles heel area much less tender. He has also had some epithelialization posteriorly there are still open areas here and still draining but overall considerably better 01/31/17- He has continue to tolerate the compression wraps. he states that he continues to use the lymphedema pumps daily, and can increase to twice daily on the weekends. He is voicing no complaints or concerns  regarding his LLE ulcers 02/07/17-he is here for follow-up evaluation. He states that he noted some erythema to the left medial and anterior thigh, which he states is new as of yesterday. He is concerned about recurrent cellulitis. He states his blood sugars have been slightly elevated, this morning in the 180s 02/14/17; he is here for follow-up evaluation. When he was last here there was erythema superiorly from his posterior wound in his anterior thigh. He was prescribed Levaquin however a culture of the wound surface grew MRSA over the phone I changed him to doxycycline on Monday and things seem to be a lot better. 02/24/17; patient missed his appointment on Friday therefore we changed his nurse visit into a physician visit today. Still using silver alginate on the large area of the posterior left thigh. He isn't new area on the dorsal left second toe 03/03/17; actually better today although he admits he has not used his external compression pumps in the last 2 days or so because of work responsibilities over the weekend. 03/10/17; continued improvement. External compression pumps once a day almost  all of his wounds have closed on the posterior left calf. Better edema control 03/17/17; in general improved. He still has 3 small open areas on the lateral aspect of his left leg however most of the area on the posterior part of his leg is epithelialized. He has better edema control. He has an ABD pad under his stocking on the right anterior lower leg although he did not let us look at that today. 03/24/17; patient arrives back in clinic today with no open areas however there are areas on the posterior left calf and anterior left calf that are less than 100% epithelialized. His edema is well controlled in the left lower leg. There is some pitting edema probably lymphedema in the left upper thigh. He uses compression pumps at home once per day. I tried to get him to do this twice a day although he is very  reticent. 04/01/2017 -- for the last 2 days he's had significant redness, tenderness and weeping and came in for an urgent visit today. 04/07/17; patient still has 6 more days of doxycycline. He was seen by Dr. Con Memos last Wednesday for cellulitis involving the posterior aspect, lateral aspect of his Involving his heel. For the most part he is better there is less erythema and less weeping. He has been on his feet for 12 hours 2 over the weekend. Using his compression pumps once a day 04/14/17 arrives today with continued improvement. Only one area on the posterior left calf that is not fully epithelialized. He has intense bilateral venous inflammation associated with his chronic venous insufficiency disease and secondary lymphedema. We have been using silver alginate to the left posterior calf wound In passing he tells [place] today that the right leg but we have not seen in quite some time has an open area on it but he doesn't want [place] to look at this today states he will show this to [place] next week. 04/21/17; there is no open area on his left leg although he still reports some weeping edema. He showed [place] his right leg today which is the first time we've seen this leg in a long time. He has a large area of open wound on the right leg anteriorly healthy granulation. Quite a bit of swelling in the right leg and some degree of venous inflammation. He told us about the right leg in passing last week but states that deterioration in the right leg really only happened over the weekend 04/28/17; there is no open area on the left leg although there is an irritated part on the posterior which is like a wrap injury. The wound on the right leg which was new from last week at least to [place] is a lot better. 05/05/17; still no open area on the left leg. Patient is using his new compression stocking which seems to be doing a good job of controlling the edema. He states he is using his compression pumps once per day. The  right leg still has an open wound although it is better in terms of surface area. Required debridement. A lot of pain in the posterior right Achilles marked tenderness. Usually this type of presentation this patient gives concern for an active cellulitis 05/12/17; patient arrives today with his major wound from last week on the right lateral leg somewhat better. Still requiring debridement. He was using his compression stocking on the left leg however that is reopened with superficial wounds anteriorly he did not have an open wound on this leg  previously. He is still using his juxta light's once daily at night. He cannot find the time to do this in the morning as he has to be at work by 7 AM 05/19/17; right lateral leg wound looks improved. No debridement required. The concerning area is on the left posterior leg which appears to almost have a subcutaneous hemorrhagic component to it. We've been using silver alginate to all the wounds 05/26/17; the right lateral leg wound continues to look improved. However the area on the left posterior calf is a tightly adherent surface. [place] using silver alginate. Because of the weeping edema in his legs there is very little good alternatives. 06/02/17; the patient left here last week looking quite good. Major wound on the left posterior calf and a small one on the right lateral calf. Both of these look satisfactory. He tells me that by Wednesday he had noted increased pain in the left leg and drainage. He called on Thursday and Friday to get an appointment here but we were blocked. He did not go to urgent care or his primary physician. He thinks he had a fever on Thursday but did not actually take his temperature. He has not been using his compression pumps on the left leg because of pain. I advised him to go to the emergency room today for IV antibiotics for stents of left leg cellulitis but he has refused I have asked him to take 2 days off work to keep his  leg elevated and he has refused this as well. In view of this I'm going to call him and Augmentin and doxycycline. 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 However after a detailed discussion regarding the risk benefits and alternatives he refuses to get admitted to the hospital. With no other recourse I will continue him on oral antibiotics as before and hopefully he'll have his infectious disease consultation this week. 06/16/2017 -- the patient was seen today by the nurse practitioner at infectious disease Ms. Dixon. 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 He has also been advise Lotrimin cream locally. she also had wise IV antibiotics for his condition if it flares up 06/23/17; patient arrives today with drainage bilaterally although the remaining wound on the left posterior calf after cleaning up today "highlighter yellow drainage" did not look too bad. Unfortunately he has had breakdown on the right anterior leg [previously this leg had not been open and he is using a black stocking] he went to see infectious disease and is been put on clindamycin 150 mg daily, I did not verify the dose although I'm not familiar with using clindamycin in this dosing range, perhaps for prophylaxisoo 06/27/17; I brought this patient back today to follow-up on the wound deterioration on the right lower leg together with surrounding cellulitis. I started him on doxycycline 4 days ago. This area looks better however he comes in today with intense cellulitis on the medial part of his left  thigh. This is not have a wound in this area. Extremely tender. We've been using silver alginate to  the wounds on the right lower leg left lower leg with bilateral 4 layer compression he is using his external compression pumps once a day 07/04/17; patient's left medial thigh cellulitis looks better. He has not been using his compression pumps as his insert said it was contraindicated with cellulitis. His right leg continues to make improvements all the wounds are still open. We only have one remaining wound on the left posterior calf. Using silver alginate to all open areas. He is on doxycycline which I started a week ago and should be finishing I gave him Augmentin after Thursday's visit for the severe cellulitis on the left medial thigh which fortunately looks better 07/14/17; the patient's left medial thigh cellulitis has resolved. The cellulitis in his right lower calf on the right also looks better. All of his wounds are stable to improved we've been using silver alginate he has completed the antibiotics I have given him. He has clindamycin 150 mg once a day prescribed by infectious disease for prophylaxis, I've advised him to start this now. We have been using bilateral Unna boots over silver alginate to the wound areas 07/21/17; the patient is been to see infectious disease who noted his recurrent problems with cellulitis. He was not able to tolerate prophylactic clindamycin therefore he is on amoxicillin 500 twice a day. He also had a second daily dose of Lasix added By Dr. Oneta Rack but he is not taking this. Nor is he being completely compliant with his compression pumps a especially not this week. He has 2 remaining wounds one on the right posterior lateral lower leg and one on the left posterior medial lower leg. 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 The patient has Unna boots bilaterally. Still wounds on his  right lateral, left medial, and a new open area on the left anterior lateral lower leg 08/04/17; he remains on amoxicillin twice a day for prophylaxis of recurrent cellulitis. He has bilateral Unna boots for compression and silver alginate to his wounds. Arrives today with his legs looking as good as I have seen him in quite some time. Not surprisingly his wounds look better as well with improvement on the right lateral leg venous insufficiency wound and also the left medial leg. He is still using the compression pumps once a day 08/11/17; both legs appear to be doing better wounds on the right lateral and left medial legs look better. Skin on the right leg quite good. He is been using silver alginate as the primary dressing. I'm going to use Anasept gel calcium alginate and maintain all the secondary dressings 08/18/17; the patient continues to actually do quite well. The area on his right lateral leg is just about closed the left medial also looks better although it is still moist in this area. His edema is well controlled we have been using Anasept gel with calcium alginate and the usual secondary dressings, 4 layer compression and once daily use of his compression pumps "always been able to manage 09/01/17; the patient continues to do reasonably well in spite of his trip to [place]. The area on the right lateral leg is epithelialized. Left is much better but still open. He has more edema and more chronic erythema on the left leg [venous inflammation] 09/08/17; he arrives today with no open wound on the right lateral leg and decently controlled edema. Unfortunately his left leg is not nearly as in his good situation as last  week.he apparently had increasing edema starting on Saturday. He edema soaked through into his foot so used a plastic bag to walk around his home. The area on the medial right leg which was his open area is about the same however he has lost surface epithelium on the  left lateral which is new and he has significant pain in the Achilles area of the left foot. He is already on amoxicillin chronically for prophylaxis of cellulitis in the left leg 09/15/17; he is completed a week of doxycycline and the cellulitis in the left posterior leg and Achilles area is as usual improved. He still has a lot of edema and fluid soaking through his dressings. There is no open wound on the right leg. He saw infectious disease NP today 09/22/17;As usual 1 we transition him from our compression wraps to his stockings things did not go well. He has several small open areas on the right leg. He states this was caused by the compression wrap on his skin although he did not wear this with the stockings over them. He has several superficial areas on the left leg medially laterally posteriorly. He does not have any evidence of active cellulitis especially involving the left Achilles The patient is traveling [facility] Saturday going to [facility]. He states he isn't attempting to get an appointment with a heel objects wound center there to change his dressings. I am not completely certain whether this will work 10/06/17; the patient came in on Friday for a nurse visit and the nurse reported that his legs actually look quite good. He arrives in clinic today for his regular follow-up visit. He has a new wound on his left third toe over the PIP probably caused by friction with his footwear. He has small areas on the left leg and a very superficial but epithelialized area on the right anterior lateral lower leg. Other than that his legs look as good as I've seen him in quite some time. We have been using silver alginate Review of systems; no chest pain no shortness of breath other than this a 10 point review of systems negative 10/20/17; seen by Dr. Meyer Russel last week. He had taken some antibiotics [doxycycline] that he had left over. Dr. Meyer Russel thought he had candida infection and  declined to give him further antibiotics. He has a small wound remaining on the right lateral leg several areas on the left leg including a larger area on the left posterior several left medial and anterior and a small wound on the left lateral. The area on the left dorsal third toe looks a lot better. ROS; Gen.; no fever, respiratory no cough no sputum Cardiac no chest pain other than this 10 point review of system is negative 10/30/17; patient arrives today having fallen in the bathtub 3 days ago. It took him a while to get up. He has pain and maceration in the wounds on his left leg which have deteriorated. He has not been using his pumps he also has some maceration on the right lateral leg. 11/03/17; patient continues to have weeping edema especially in the left leg. This saturates his dressings which were just put on on 12/27. As usual the doxycycline seems to take care of the cellulitis on his lower leg. He is not complaining of fever, chills, or other systemic symptoms. He states his leg feels a lot better on the doxycycline I gave him empirically. He also apparently gets injections at his primary doctor's officeo Rocephin for  cellulitis prophylaxis. I didn't ask him about his compression pump compliance today I think that's probably marginal. Arrives in the clinic with all of his dressings primary and secondary macerated full of fluid and he has bilateral edema 11/10/17; the patient's right leg looks some better although there is still a cluster of wounds on the right lateral. The left leg is inflamed with almost circumferential skin loss medially to laterally although we are still maintaining anteriorly. He does not have overt cellulitis there is a lot of drainage. He is not using compression pumps. We have been using silver alginate to the wound areas, there are not a lot of options here 11/17/17; the patient's right leg continues to be stable although there is still open wounds, better than  last week. The inflammation in the left leg is better. Still loss of surface layer epithelium especially posteriorly. There is no overt cellulitis in the amount of edema and his left leg is really quite good, tells me he is using his compression pumps once a day. 11/24/17; patient's right leg has a small superficial wound laterally this continues to improve. The inflammation in the left leg is still improving however we have continuous surface layer epithelial loss posteriorly. There is no overt cellulitis in the amount of edema in both legs is really quite good. He states he is using his compression pumps on the left leg once a day for 5 out of 7 days 12/01/17; very small superficial areas on the right lateral leg continue to improve. Edema control in both legs is better today. He has continued loss of surface epithelialization and left posterior calf although I think this is better. We have been using silver alginate with large number of absorptive secondary dressings 4 layer on the left Unna boot on the right at his request. He tells me he is using his compression pumps once a day 12/08/17; he has no open area on the right leg is edema control is good here. On the left leg however he has marked erythema and tenderness breakdown of skin. He has what appears to be a wrap injury just distal to the popliteal fossa. 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 There was swelling erythema and breakdown in the right leg. He has much worse skin breakdown in the left leg as well multiple open areas medially and posteriorly as well as laterally. He tells me he has been using his compression pumps but tells me he feels that the  drainage out of his leg is worse when he uses a compression pumps. To be fair to him he is been saying this for a while however I don't know that I have really been listening to this. I wonder if the compression pumps are working properly 12/22/17;. Once again he arrives with severe erythema, weeping edema from the left greater than right leg. Noncompliance with compression pumps. New this visit he is complaining of pain on the lateral aspect of the right leg and the medial aspect of his right thigh. He apparently saw his cardiologist Dr. [name] who was ordered an echocardiogram area and I think this is a step in the right direction 12/25/17; started his doxycycline Monday night. There is still intense erythema  of the right leg especially in the anterior thigh although there is less tenderness. The erythema around the wound on the right lateral calf also is less tender. He still complaining of pain in the left heel. His wounds are about the same right lateral left medial left lateral. Superficial but certainly not close to closure. He denies being systemically unwell no fever chills no abdominal pain no diarrhea 12/29/17; back in follow-up of his extensive right calf and right thigh cellulitis. I added amoxicillin to cover possible doxycycline resistant strep. This seems to of done the trick he is in much less pain there is much less erythema and swelling. He has his echocardiogram at 11:00 this morning. X-ray of the left heel was also negative. 01/05/18; the patient arrived with his edema under much better control. Now that he is retired he is able to use his compression pumps daily and sometimes twice a day per the patient. He has a wound on the right leg the lateral wound looks better. Area on the left leg also looks a lot better. He has no evidence of cellulitis in his bilateral thighs I had a quick peak at his echocardiogram. He is in normal ejection fraction and normal left ventricular function.  He has moderate pulmonary hypertension moderately reduced right ventricular function. One would have to wonder about chronic sleep apnea although he says he doesn't snore. He'll review the echocardiogram with his cardiologist. 01/12/18; the patient arrives with the edema in both legs under exemplary control. He is using his compression pumps daily and sometimes twice daily. His wound on the right lateral leg is just about closed. He still has some weeping areas on the posterior left calf and lateral left calf although everything is just about closed here as well. I have spoken with [name] who is the patient's nurse practitioner and infectious disease. She was concerned that the patient had not understood that the parenteral penicillin injections he was receiving for cellulitis prophylaxis was actually benefiting him. I don't think the patient actually saw that I would tend to agree we were certainly dealing with less infections although he had a serious one last month. 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 He is healed. He'll be placed in juxtalite compression wraps and increase his lymphedema pumps to twice daily. We will follow up again next week to ensure there are no issues with the new regiment. 01/20/18-he is here for evaluation of bilateral lower extremity weeping edema. Yesterday he was placed in compression wrap to the right lower extremity and compression stocking to left lower shrubbery. He states he uses lymphedema pumps last night and again this morning and noted a blister to the left lower extremity. On exam he was noted to have drainage to the right lower extremity. He will be placed in Unna boots bilaterally and follow-up next week 01/26/18; patient was actually discharged a week ago to his own juxta light stockings only to return the next day with bilateral lower extremity weeping edema.he was placed in bilateral Unna boots. He  arrives today with pain in the back of his left leg. There is no open area on the right leg however there is a linear/wrap injury on the left leg and weeping edema on the left leg posteriorly. I spoke with infectious disease about 10 days ago. They were disappointed that the patient elected to discontinue prophylactic intramuscular penicillin shots as they felt it was particularly beneficial in reducing the frequency of  his cellulitis. I discussed this with the patient today. He does not share this view. He'll definitely need antibiotics today. Finally he is traveling to [place] and trauma leaving this Saturday and returning a week later and he does not travel with his pumps. He is going by car 01/30/18; patient was seen 4 days ago and brought back in today for review of cellulitis in the left leg posteriorly. I put him on amoxicillin this really hasn't helped as much as I might like. He is also worried because he is traveling to [facility] trauma by car. Finally we will be rewrapping him. There is no open area on the right leg over his left leg has multiple weeping areas as usual 02/09/18; The same wrap on for 10 days. He did not pick up the last doxycycline I prescribed for him. He apparently took 4 days worth he already had. There is nothing open on his right leg and the edema control is really quite good. He's had damage in the left leg medially and laterally especially probably related to the prolonged use of Unna boots 02/12/18; the patient arrived in clinic today for a nurse visit/wrap change. He complained of a lot of pain in the left posterior calf. He is taking doxycycline that I previously prescribed for him. Unfortunately even though he used his stockings and apparently used to compression pumps twice a day he has weeping edema coming out of the lateral part of his right leg. This is coming from the lower anterior lateral skin area. 02/16/18; the patient has finished his doxycycline and  will finish the amoxicillin 2 days. The area of cellulitis in the left calf posteriorly has resolved. He is no longer having any pain. He tells me he is using his compression pumps at least once a day sometimes twice. 02/23/18; the patient finished his doxycycline and Amoxil last week. On Friday he noticed a small erythematous circle about the size of a quarter on the left lower leg just above his ankle. This rapidly expanded and he now has erythema on the lateral and posterior part of the thigh. This is bright red. Also has an area on the dorsal foot just above his toes and a tender area just below the left popliteal fossa. He came off his prophylactic penicillin injections at his own insistence one or 2 months ago. This is obviously deteriorated since then 03/02/18; patient is on doxycycline and Amoxil. Culture I did last week of the weeping area on the back of his left calf grew group B strep. I have therefore renewed the amoxicillin 500 3 times a day for a further week. He has not been systemically unwell. Still complaining of an area of discomfort right under his left popliteal fossa. There is no open wound on the right leg. He tells me that he is using his pumps twice a day on most days 03/09/18; patient arrives in clinic today completing his amoxicillin today. The cellulitis on his left leg is better. Furthermore he tells me that he had intramuscular penicillin shots that his primary care office today. However he also states that the wrap on his right leg fell down shortly after leaving clinic last week. He developed a large blister that was present when he came in for a nurse visit later in the week and then he developed intense discomfort around this area.He tells me he is using his compression pumps 03/16/18; the patient has completed his doxycycline. The infectious part of this/cellulitis in the left heel  area left popliteal area is a lot better. He has 2 open areas on the right calf. Still  areas on the left calf but this is a lot better as well. 03/24/18; the patient arrives complaining of pain in the left popliteal area again. He thinks some of this is wrap injury. He has no open area on the right leg and really no open area on the left calf either except for the popliteal area. He claims to be compliant with the compression pumps 03/31/18; I gave him doxycycline last week because of cellulitis in the left popliteal area. This is a lot better although the surface epithelium is denuded off and response to this. He arrives today with uncontrolled edema in the right calf area as well as a fingernail injury in the right lateral calf. There is only a few open areas on the left 04/06/18; I gave him amoxicillin doxycycline over the last 2 weeks that the amoxicillin should be completing currently. He is not complaining of any pain or systemic symptoms. The only open areas see has is on the right lateral lower leg paradoxically I cannot see anything on the left lower leg. He tells me he is using his compression pumps twice a day on most days. Silver alginate to the wounds that are open under 4 layer compression 04/13/18; he completed antibiotics and has no new complaints. Using his compression pumps. Silver alginate that anything that's opened 04/20/18; he is using his compression pumps religiously. Silver alginate 4 layer compression anything that's opened. He comes in today with no open wounds on the left leg but 3 on the right including a new one posteriorly. He has 2 on the right lateral and one on the right posterior. He likes Unna boots on the right leg for reasons that aren't really clear we had the usual 4 layer compression on the left. It may be necessary to move to the 4 layer compression on the right however for now I left them in the Unna boots 04/27/18; he is using his compression pumps at least once a day. He has still the wounds on the right lateral calf. The area right posteriorly  has closed. He does not have an open wound on the left under 4 layer compression however on the dorsal left foot just proximal to the toes and the left third toe 2 small open areas were identified 05/11/18; he has not uses compression pumps. The areas on the right lateral calf have coalesced into one large wound necrotic surface. On the left side he has one small wound anteriorly however the edema is now weeping out of a large part of his left leg. He says he wasn't using his pumps because of the weeping fluid. I explained to him that this is the time he needs to pump more 05/18/18; patient states he is using his compression pumps twice a day. The area on the right lateral large wound albeit superficial. On the left side he has innumerable number of small new wounds on the left calf particularly laterally but several anteriorly and medially. All these appear to have healthy granulated base these look like the remnants of blisters however they occurred under compression. The patient arrives in clinic today with his legs somewhat better. There is certainly less edema, less multiple open areas on the left calf and the right anterior leg looks somewhat better as well superficial and a little smaller. However he relates pain and erythema over the last 3-4 days in the  thigh and I looked at this today. He has not been systemically unwell no fever no chills no change in blood sugar values 05/25/18; comes in today in a better state. The severe cellulitis on his left leg seems better with the Keflex. Not as tender. He has not been systemically unwell Hard to find an open wound on the left lower leg using his compression pumps twice a day The confluent wounds on his right lateral calf somewhat better looking. These will ultimately need debridement I didn't do this today. 06/01/18; the severe cellulitis on the left anterior thigh has resolved and he is completed his Keflex. There is no open wound on the left leg  however there is a superficial excoriation at the base of the third toe dorsally. Skin on the bottom of his left foot is macerated looking. The left the wounds on the lateral right leg actually looks some better although he did require debridement of the top half of this wound area with an open curet 06/09/18 on evaluation today patient appears to be doing poorly in regard to his right lower extremity in particular this appears to likely be infected he has very thick purulent discharge along with a bright green tent to the discharge. This makes me concerned about the possibility of pseudomonas. He's also having increased discomfort at this point on evaluation. Fortunately there does not appear to be any evidence of infection spreading to the other location at this time. 06/16/18 on evaluation today patient appears to actually be doing fairly well. His ulcer has actually diminished in size quite significantly at this point which is good news. Nonetheless he still does have some evidence of infection he did see infectious disease this morning before coming here for his appointment. I did review the results of their evaluation and their note today. They did actually have him discontinue the Cipro and initiate treatment with linezolid at this time. He is doing this for the next seven days and they recommended a follow-up in four months with them. He is the keep a log of the need for intermittent antibiotic therapy between now and when he falls back up with infectious disease. This will help them gaze what exactly they need to do to try and help them out. 06/23/18; the patient arrives today with no open wounds on the left leg and left third toe healed. He is been using his compression pumps twice a day. On the right lateral leg he still has a sizable wound but this is a lot better than last time I saw this. In my absence he apparently cultured MRSA coming from this wound and is completed a course of  linezolid as has been directed by infectious disease. Has been using silver alginate under 4 layer compression 06/30/18; the only open wound he has is on the right lateral leg and this looks healthy. No debridement is required. We have been using silver alginate. He does not have an open wound on the left leg. There is apparently some drainage from the dorsal proximal third toe on the left although I see no open wound here. 07/03/18 on evaluation today patient was actually here just for a nurse visit rapid change. However when he was here on Wednesday for his rat change due to having been healed on the left and then developing blisters we initiated the wrap again knowing that he would be back today for [place] to reevaluate and see were at. Unfortunately he has developed some cellulitis into the  proximal portion of his right lower extremity even into the region of his thigh. 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 He was placed on one as what at that point. Nonetheless he is done with that and has been tolerating it well otherwise. Doxycycline which in the past really did not seem to be effective for him. Nonetheless I think the best option may be for [place] to definitely reinitiate the antibiotics for a longer period of time. 07/07/18; since I last saw this patient a week ago he has had a difficult time. At that point he did not have an open wound on his left leg. We transitioned him into juxta light stockings. He was apparently in the clinic the next day with blisters on the left lateral and left medial lower calf. He also had weeping edema fluid. He was put back into a compression wrap. He was also in the clinic on Friday with intense erythema in his right thigh. Per the patient he was started on Bactrim however that didn't work at all in terms of relieving his pain and swelling. He has taken 3 doxycycline that he had left over from last time and that seems to of  helped. He has blistering on the right thigh as well. 07/14/18; the erythema on his right thigh has gotten better with doxycycline that he is finishing. 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 Presumably this cellulitis in the thigh was not related to that although I think this is a bit concerning going forward. He still has an area on the right lateral calf the blister on the right medial calf just below the knee that was discussed above. On the left 2 small open areas left medial and left lateral. Edema control is adequate. He is using his compression pumps twice a day 07/20/18; continued improvement in the condition of both legs especially the edema in his bilateral thighs. He tells me he is been losing weight through a combination of diet and exercise. He is using his compression pumps twice a day. So overall she made to the remaining wounds 07/27/2018; continued improvement in condition of both legs. His edema is well controlled. The area on the right lateral leg is just about closed he had one blisters show up on the medial left upper calf. We have him in 4 layer compression. He is going on a 10-day trip to [place], [place] and [place]. He will be driving. He wants to wear Unna boots because of the lessening amount of constriction. He will not use compression pumps while he is away 08/05/18 on evaluation today patient actually appears to be doing decently well all things considered in regard to his bilateral lower extremities. The worst ulcer is actually only posterior aspect of his left lower extremity with a four layer compression wrap cut into his leg a couple weeks back. He did have a trip and actually had [employer] for the trip that he is worn since he was last here. Nonetheless he feels like the [employer] actually do better for him his swelling is up a little bit but he also with his trip was  not taking his Lasix on a regular set schedule like he was supposed to be. He states that obviously the reason being that he cannot drive and keep going without having to urinate too frequently  which makes it difficult. He did not have his pumps with him while he was away either which I think also maybe playing a role here too. 08/13/2018; the patient only has a small open wound on the right lateral calf which is a big improvement in the last month or 2. He also has the area posteriorly just below the posterior fossa on the left which I think was a wrap injury from several weeks ago. He has no current evidence of cellulitis. He tells me he is back into his compression pumps twice a day. He also tells me that while he was at the laundromat somebody stole a section of his extremitease stockings 08/20/2018; back in the clinic with a much improved state. He only has small areas on the right lateral mid calf which is just about healed. This was is more substantial area for quite a prolonged period of time. He has a small open area on the left anterior tibia. The area on the posterior calf just below the popliteal fossa is closed today. He is using his compression pumps twice a day 08/28/2018; patient has no open wound on the right leg. He has a smattering of open areas on the calf with some weeping lymphedema. More problematically than that it looks as though his wraps of slipped down in his usual he has very angry upper area of edema just below the right medial knee and on the right lateral calf. He has no open area on his feet. The patient is traveling to [facility] next week. I will send him in an antibiotic. We will continue to wrap the right leg. We ordered extremitease stockings for him last week and I plan to transition the right leg to a stocking when he gets home which will be in 10 days time. As usual he is very reluctant to take his pumps with him when he travels 09/07/2018; patient  returns from [facility]. He shows me a picture of his left leg in the mid part of his trip last week with intense fire engine erythema. The picture look bad enough I would have considered sending him to the hospital. Instead he went to the wound care center in [facility]. They did not prescribe him antibiotics but he did take some doxycycline he had leftover from a previous visit. I had given him trimethoprim sulfamethoxazole before he left this did not work according to the patient. This is resulted in some improvement fortunately. He comes back with a large wound on the left posterior calf. Smaller area on the left anterior tibia. Denuded blisters on the dorsal left foot over his toes. Does not have much in the way of wounds on the right leg although he does have a very tender area on the right posterior area just below the popliteal fossa also suggestive of infection. He promises me he is back on his pumps twice a day 09/15/2018; the intense cellulitis in his left lower calf is a lot better. The wound area on the posterior left calf is also so better. However he has reasonably extensive wounds on the dorsal aspect of his second and third toes and the proximal foot just at the base of the toes. There is nothing open on the right leg 09/22/2018; the patient has excellent edema control in his legs bilaterally. He is using his external compression pumps twice a day. He has no open area on the right leg and only the areas in the left foot dorsally  second and third toe area on the left side. He does not have any signs of active cellulitis. 10/06/2018; the patient has good edema control bilaterally. He has no open wound on the right leg. There is a blister in the posterior aspect of his left calf that we had to deal with today. He is using his compression pumps twice a day. There is no signs of active cellulitis. We have been using silver alginate to the wound areas. He still  has vulnerable areas on the base of his left first second toes dorsally He has a his extremities stockings and we are going to transition him today into the stocking on the right leg. He is cautioned that he will need to continue to use the compression pumps twice a day. If he notices uncontrolled edema in the right leg he may need to go to 3 times a day. 10/13/2018; the patient came in for a nurse check on Friday he has a large flaccid blister on the right medial calf just below the knee. We unroofed this. He has this and a new area underneath the posterior mid calf which was undoubtedly a blister as well. He also has several small areas on the right which is the area we put his extremities stocking on. 10/19/2018; the patient went to see infectious disease this morning I am not sure if that was a routine follow-up in any case the doxycycline I had given him was discontinued and started on linezolid. He has not started this. It is easy to look at his left calf and the inflammation and think this is cellulitis however he is very tender in the tissue just below the popliteal fossa and I have no doubt that there is infection going on here. He states the problem he is having is that with the compression pumps the edema goes down and then starts walking the wrap falls down. We will see if we can adhere this. He has 1 or 2 minuscule open areas on the right still areas that are weeping on the posterior left calf, the base of his left second and third toes 10/26/18; back today in clinic with quite of skin breakdown in his left anterior leg. This may have been infection the area below the popliteal fossa seems a lot better however tremendous epithelial loss on the left anterior mid tibia area over quite inexpensive tissue. He has 2 blisters on the right side but no other open wound here. 10/29/2018; came in urgently to see [place] today and we worked him in for review. He states that the 4 layer compression  on the right leg caused pain he had to cut it down to roughly his mid calf this caused swelling above the wrap and he has blisters and skin breakdown today. As a result of the pain he has not been using his pumps. Both legs are a lot more edematous and there is a lot of weeping fluid. 11/02/18; arrives in clinic with continued difficulties in the right leg> left. Leg is swollen and painful. multiple skin blisters and new open areas especially laterally. He has not been using his pumps on the right leg. 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 He is not systemically unwell. 11/09/2018; the patient claims he is being compliant with his pumps. He is finished the doxycycline I gave him last week. Culture I did of the wound on the right lateral leg showed a few very resistant methicillin  staph aureus. This was resistant to doxycycline. Nevertheless he states the pain in the leg is a lot better which makes me wonder if the cultured organism was not really what was causing the problem nevertheless this is a very dangerous organism to be culturing out of any wound. His right leg is still a lot larger than the left. He is using an Haematologist on this area, he blames a 4-layer compression for causing the original skin breakdown which I doubt is true however I cannot talk him out of it. We have been using silver alginate to all of these areas which were initially blisters 11/16/2018; patient is being compliant with his external compression pumps at twice a day. Miraculously he arrives in clinic today with absolutely no open wounds. He has better edema control on the left where he has been using 4 layer compression versus wound of wounds on the right and I pointed this out to him. There is no inflammation in the skin in his lower legs which is also somewhat unusual for him. There is no open wounds on the dorsal left foot. He has extremitease stockings at home and I  have asked him to bring these in next week. 11/25/18 patient's lower extremity on examination today on the left appears for the most part to be wound free. He does have an open wound on the lateral aspect of the right lower extremity but this is minimal compared to what I've seen in past. He does request that we go ahead and wrap the left leg as well even though there's nothing open just so hopefully it will not reopen in short order. 1/28; patient has superficial open wounds on the right lateral calf left anterior calf and left posterior calf. His edema control is adequate. 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 We have been using silver alginate as the primary dressing. He claims compliance with his compression pumps 2/4; patient has superficial open wounds on numerous areas of his left calf and again one on the left dorsal foot. The areas on the right lateral calf have healed. The cellulitis that I gave him doxycycline for last week is also resolved this was mostly on the left anterior calf just below the tibial tuberosity. His edema looks fairly well-controlled. He tells me he went to see his primary doctor today and had blood work ordered 2/11; once again he has several open areas on the left calf left tibial area. Most of these are small and appear to have healthy granulation. He does not have anything open on the right. The edema and control in his thighs is pretty good which is usually a good indication he has been using his pumps as requested. 2/18; he continues to have several small areas on the left calf and left tibial area. Most of these are small healthy granulation. We put him in his stocking on the right leg last week and he arrives with a superficial open area over the right upper tibia and a fairly large area on the right lateral tibia in similar condition. His edema control actually does not  look too bad, he claims to be using his compression pumps twice a day 2/25. Continued small areas on the left calf and left tibial area. New areas especially on the right are identified just below the tibial tuberosity and on the right upper tibia itself. There are also areas of  weeping edema fluid even without an obvious wound. He does not have a considerable degree of lymphedema but clearly there is more edema here than his skin can handle. He states he is using the pumps twice a day. We have an Unna boot on the right and 4 layer compression on the left. 3/3; he continues to have an area on the right lateral calf and right posterior calf just below the popliteal fossa. There is a fair amount of tenderness around the wound on the popliteal fossa but I did not see any evidence of cellulitis, could just be that the wrap came down and rubbed in this area. He does not have an open area on the left leg however there is an area on the left dorsal foot at the base of the third toe We have been using silver alginate to all wound areas 3/10; he did not have an open area on his left leg last time he was here a week ago. Today he arrives with a horizontal wound just below the tibial tuberosity and an area on the left lateral calf. He has intense erythema and tenderness in this area. The area is on the right lateral calf and right posterior calf better than last week. We have been using silver alginate as usual 3/18 - Patient returns with 3 small open areas on left calf, and 1 small open area on right calf, the skin looks ok with no significant erythema, he continues the UNA boot on right and 4 layer compression on left. The right lateral calf wound is closed , the right posterior is small area. we will continue silver alginate to the areas. Culture results from right posterior calf wound is + MRSA sensitive to Bactrim but resistant to DOXY 01/27/19 on evaluation today patient's bilateral lower extremities  actually appear to be doing fairly well at this point which is good news. He is been tolerating the dressing changes without complication. Fortunately she has made excellent improvement in regard to the overall status of his wounds. Unfortunately every time we cease wrapping him he ends up reopening in causing more significant issues at that point. Again I'm unsure of the best direction to take although I think the lymphedema clinic may be appropriate for him. 02/03/19 on evaluation today patient appears to be doing well in regard to the wounds that we saw him for last week unfortunately he has a new area on the proximal portion of his right medial/posterior lower extremity where the wrap somewhat slowed down and caused swelling and a blister to rub and open. Unfortunately this is the only opening that he has on either leg at this point. 02/17/19 on evaluation today patient's bilateral lower extremities appear to be doing well. He still completely healed in regard to the left lower extremity. In regard to the right lower extremity the area where the wrap and slid down and caused the blister still seems to be slightly open although this is dramatically better than during the last evaluation two weeks ago. I'm very pleased with the way this stands overall. 03/03/19 on evaluation today patient appears to be doing well in regard to his right lower extremity in general although he did have a new blister open this does not appear to be showing any evidence of active infection at this time. Fortunately there's No fevers, chills, nausea, or vomiting noted at this time. Overall I feel like he is making good progress it does feel like that the right leg will  we perform the [employer] seems to do with a bit better than three layer wrap on the left which slid down on him. We may switch to doing bilateral in the book wraps. 5/4; I have not seen [name] in quite some time. According to our case manager he did not  have an open wound on his left leg last week. He had 1 remaining wound on the right posterior medial calf. He arrives today with multiple openings on the left leg probably were blisters and/or wrap injuries from Unna boots. I do not think the Unna boot's will provide adequate compression on the left. I am also not clear about the frequency he is using the compression pumps. 03/17/19 on evaluation today patient appears to be doing excellent in regard to his lower extremities compared to last week's evaluation apparently. He had gotten significantly worse last week which is unfortunate. The [employer] wrap on the left did not seem to do very well for him at all and in fact it didn't control his swelling significantly enough he had an additional outbreak. Subsequently we go back to the four layer compression wrap on the left. This is good news. At least in that he is doing better and the wound seem to be killing him. He still has not heard anything from the lymphedema clinic. 03/24/19 on evaluation today patient actually appears to be doing much better in regard to his bilateral lower Trinity as compared to last week when I saw him. Fortunately there's no signs of active infection at this time. He has been tolerating the dressing changes without complication. Overall I'm extremely pleased with the progress and appearance in general. 04/07/19 on evaluation today patient appears to be doing well in regard to his bilateral lower extremities. His swelling is significantly down from where it was previous. With that being said he does have a couple blisters still open at this point but fortunately nothing that seems to be too severe and again the majority of the larger openings has healed at this time. 04/14/19 on evaluation today patient actually appears to be doing quite well in regard to his bilateral lower extremities in fact I'm not even sure there's anything significantly open at this time at any site.  Nonetheless he did have some trouble with these wraps where they are somewhat irritating him secondary to the fact that he has noted that the graph wasn't too close down to the end of this foot in a little bit short as well up to his knee. Otherwise things seem to be doing quite well. 04/21/19 upon evaluation today patient's wound bed actually showed evidence of being completely healed in regard to both lower extremities which is excellent news. There does not appear to be any signs of active infection which is also good news. I'm very pleased in this regard. No fevers, chills, nausea, or vomiting noted at this time. 04/28/19 on evaluation today patient appears to be doing a little bit worse in regard to both lower extremities on the left mainly due to the fact that when he went infection disease the wrap was not wrapped quite high enough he developed a blister above this. On the right he is a small open area of nothing too significant but again this is continuing to give him some trouble he has been were in the Velcro compression that he has at home. 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 He's  been tolerating the dressing changes without complication. Fortunately there's no signs of active infection at this time. No fevers, chills, nausea, or vomiting noted at this time. We have been trying to get an appointment with her lymphedema clinic in [facility] but unfortunately nobody can get them on phone with not been able to even fax information over the patient likewise is not been able to get in touch with them. Overall I'm not sure exactly what's going on here with to reach out again today. 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 Still having a lot of drainage unfortunately. He tells me especially in the left but even on the right.  There's no signs of active infection which is good news we've been using so ratcheted up to this point. 05/19/19 on evaluation today patient actually appears to be doing quite well with regard to his left lower extremity which is great news. Fortunately in regard to the right lower extremity has an issues with his wrap and he subsequently did remove this from what I'm understanding. Nonetheless long story short is what he had rewrapped once he removed it subsequently had maggots underneath this wrap whenever he came in for evaluation today. With that being said they were obviously completely cleaned away by the nursing staff. The visit today which is excellent news. However he does appear to potentially have some infection around the right ankle region where the maggots were located as well. He will likely require anabiotic therapy today. 05/26/19 on evaluation today patient actually appears to be doing much better in regard to his bilateral lower extremities. I feel like the infection is under much better control. With that being said there were maggots noted when the wrap was removed yet again today. Again this could have potentially been left over from previous although at this time there does not appear to be any signs of significant drainage there was obviously on the wrap some drainage as well this contracted gnats or otherwise. Either way I do not see anything that appears to be doing worse in my pinion and in fact I think his drainage has slowed down quite significantly likely mainly due to the fact to his infection being under better control. 06/02/2019 on evaluation today patient actually appears to be doing well with regard to his bilateral lower extremities there is no signs of active infection at this time which is great news. With that being said he does have several open areas more so on the right than the left but nonetheless these are all significantly better than previously  noted. 06/09/2019 on evaluation today patient actually appears to be doing well. His wrap stayed up and he did not cause any problems he had more drainage on the right compared to the left but overall I do not see any major issues at this time which is great news. 06/16/2019 on evaluation today patient appears to be doing excellent with regard to his lower extremities the only area that is open is a new blister that can have opened as of today on the medial ankle on the left. Other than this he really seems to be doing great I see no major issues at this point. 06/23/2019 on evaluation today patient appears to be doing quite well with regard to his bilateral lower extremities. In fact he actually appears to be almost completely healed there is a small area of weeping noted  of the right lower extremity just above the ankle. Nonetheless fortunately there is no signs of active infection at this time which is good news. No fevers, chills, nausea, vomiting, or diarrhea. 8/24; the patient arrived for a nurse visit today but complained of very significant pain in the left leg and therefore I was asked to look at this. Noted that he did not have an open area on the left leg last week nevertheless this was wrapped. The patient states that he is not been able to put his compression pumps on the left leg because of the discomfort. He has not been systemically unwell 06/30/2019 on evaluation today patient unfortunately despite being excellent last week is doing much worse with regard to his left lower extremity today. In fact he had to come in for a nurse on Monday where his left leg had to be rewrapped due to excessive weeping Dr. Leanord Hawking placed him on doxycycline at that point. Fortunately there is no signs of active infection Systemically at this time which is good news. 07/07/2019 in regard to the patient's wounds today he actually seems to be doing well with his right lower extremity there really is nothing  open or draining at this point this is great news. Unfortunately the left lower extremity is given him additional trouble at this time. There does not appear to be any signs of active infection nonetheless he does have a lot of edema and swelling noted at this point as well as blistering all of which has led to a much more poor appearing leg at this time compared to where it was 2 weeks ago when it was almost completely healed. Obviously this is a little discouraging for the patient. He is try to contact the [place]hed[place] he has not been able to get through to them. 07/14/2019 on evaluation today patient actually appears to be doing slightly better with regard to his left lower extremity ulcers. Overall I do feel like at least at the top of the wrap that we have been placing this area has healed quite nicely and looks much better. The remainder of the leg is showing signs of improvement. Unfortunately in the thigh area he still has an open region on the left and again on the right he has been utilizing just a Band-Aid on an area that also opened on the thigh. Again this is an area that were not able to wrap although we did do an Ace wrap to provide some compression that something that obviously is a little less effective than the compression wraps we have been using on the lower portion of the leg. He does have an appointment with the lymphedema clinic in [facility] on Friday. 07/21/2019 on evaluation today patient appears to be doing better with regard to his lower extremity ulcers. He has been tolerating the dressing changes without complication. Fortunately there is no signs of active infection at this time. No fevers, chills, nausea, vomiting, or diarrhea. I did receive the paperwork from the physical therapist at the lymphedema clinic in [place]. Subsequently I signed off on that this morning and sent that back to him for further progression with the treatment  plan. 07/28/2019 on evaluation today patient appears to be doing very well with regard to his right lower extremity where I do not see any open wounds at this point. Fortunately he is feeling great as far as that is concerned as well. In regard to the left lower extremity he has been having  issues with still several areas of weeping and edema although the upper leg is doing better his lower leg still I think is going require the compression wrap at this time. No fevers, chills, nausea, vomiting, or diarrhea. 08/04/2019 on evaluation today patient unfortunately is having new wounds on the right lower extremity. Again we have been using Unna boot wrap on that side. We switched him to using his juxta lite wrap at home. With that being said he tells me he has been using it although his legs extremely swollen and to be honest really does not appear that he has been. I cannot know that for sure however. Nonetheless he has multiple new wounds on the right lower extremity at this time. Obviously we will have to see about getting this rewrapped for him today. 08/11/2019 on evaluation today patient appears to be doing fairly well with regard to his wounds. He has been tolerating the dressing changes including the compression wraps without complication. He still has a lot of edema in his upper thigh regions bilaterally he is supposed to be seeing the lymphedema clinic on the 15th of this month once his wraps arrive for the upper part of his legs. 08/18/2019 on evaluation today patient appears to be doing well with regard to his bilateral lower extremities at this point. He has been tolerating the dressing changes without complication. Fortunately there is no signs of active infection which is also good news. He does have a couple weeping areas on the first and second toe of the right foot he also has just a small area on the left foot upper leg and a small area on the left lower leg but overall he is  doing quite well in my opinion. He is supposed to be getting his wraps shortly in fact tomorrow and then subsequently is seeing the lymphedema clinic next Wednesday on the 21st. Of note he is also leaving on the 25th to go on vacation for a week to the beach. For that reason and since there is some uncertainty about what there can be doing at lymphedema clinic next Wednesday I am get a make an appointment for next Friday here for [place] to see what we need to do for him prior to him leaving for vacation. 10/23; patient arrives in considerable pain predominantly in the upper posterior calf just distal to the popliteal fossa also in the wound anteriorly above the major wound. This is probably cellulitis and he has had this recurrently in the past. He has no open wound on the right side and he has had an Radio broadcast assistant in that area. Finally I note that he has an area on the left posterior calf which by enlarge is mostly epithelialized. This protrudes beyond the borders of the surrounding skin in the setting of dry scaly skin and lymphedema. The patient is leaving for [facility] on Sunday. Per his longstanding pattern, he will not take his compression pumps with him predominantly out of fear that they will be stolen. He therefore asked that we put a Unna boot back on the right leg. He will also contact the wound care center in [facility] to see if they can change his dressing in the mid week. 11/3; patient returned from his vacation to [facility]. He was seen on 1 occasion at their wound care center. They did a 2 layer compression system as they did not have our 4-layer wrap. I am not completely certain what they put on the wounds.  They did not change the Unna boot on the right. The patient is also seeing a lymphedema specialist physical therapist in [place]. It appears that he has some compression sleeve for his thighs which indeed look quite a bit better than I am used to seeing. He pumps over  these with his external compression pumps. Electronic Signature(s) Signed: 09/07/2019 6:03:12 PM By: Baltazar Najjar MD Entered By: Baltazar Najjar on 09/07/2019 17:14:26 -------------------------------------------------------------------------------- Physical Exam Details Patient Name: Date of Service: [name], [name] 09/07/2019 2:30 PM Medical Record [id number] Patient Account Number: [id number] Date of Birth/Sex: Treating RN: Feb 20, 1951 (68 y.o. Judie Petit) [name] Primary Care Provider: Nicoletta Ba Other Clinician: Referring Provider: Treating Provider/Extender:Markevius [name], [name], [name] Weeks in Treatment: 189 Constitutional Patient is hypertensive.. Pulse regular and within target range for patient.Marland Kitchen Respirations regular, non-labored and within target range.. Temperature is normal and within the target range for the patient.Marland Kitchen Appears in no distress. Eyes Conjunctivae clear. No discharge.no icterus. Respiratory work of breathing is normal. Cardiovascular Pedal pulses are palpable. Which better edema control. Much better edema control in both eyes as well. Lymphatic None palpable in the popliteal area bilaterally. Integumentary (Hair, Skin) Chronic venous inflammation in the left leg.Marland Kitchen Psychiatric appears at normal baseline. Notes Wound exam; right leg has no open area. He has a posterior left calf wound medial left ankle wound. Several small scattered areas. There is improvement in the amount of erythema. Still some tenderness where the wrap fell down but generally a lot better looking left lower extremity. Electronic Signature(s) Signed: 09/07/2019 6:03:12 PM By: Baltazar Najjar MD Entered By: Baltazar Najjar on 09/07/2019 17:20:10 -------------------------------------------------------------------------------- Physician Orders Details Patient Name: Date of Service: [name] 09/07/2019 2:30 PM Medical Record [id number] Patient Account Number:  [id number] Date of Birth/Sex: Treating RN: 1951/09/02 (68 y.o. 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 Dressing Change Frequency Do not change entire dressing for one week. - both legs Skin Barriers/Peri-Wound Care TCA Cream or Ointment - mix with lotion both legs Wound Cleansing May shower with protection. Primary Wound Dressing Wound #140 Left,Posterior,Circumferential Lower Leg Calcium Alginate with Silver Wound #141 Left,Medial Lower Leg Calcium Alginate with Silver Wound #142 Left,Anterior Lower Leg Calcium Alginate with Silver Wound #143 Left,Lateral Lower Leg Calcium Alginate with Silver Wound #144 Left Toe Fourth Calcium Alginate with Silver Secondary Dressing Wound #140 Left,Posterior,Circumferential Lower Leg Dry Gauze ABD pad Wound #141 Left,Medial Lower Leg Dry Gauze ABD pad Wound #142 Left,Anterior Lower Leg Dry Gauze ABD pad Wound #143 Left,Lateral Lower Leg Dry Gauze ABD pad Wound #144 Left Toe Fourth Dry Gauze ABD pad Edema Control 4 layer compression: Left lower extremity - unna boot at upper portion of lower leg. Unna Boot to Right Lower Extremity Avoid standing for long periods of time Elevate legs to the level of the heart or  above for 30 minutes daily and/or when sitting, a frequency of: Exercise regularly Segmental Compressive Device. - lymphadema pumps  60 min 2 times per day Additional Orders / Instructions Other: - start oral antibiotics and complete all antibiotics. Electronic Signature(s) Signed: 09/07/2019 6:03:12 PM By: Baltazar Najjar MD Signed: 10/13/2019 12:05:18 PM By: Kevin Pax RN Entered By: [name] on 09/07/2019 16:45:53 -------------------------------------------------------------------------------- Problem List Details Patient Name: Date of Service: [name] 09/07/2019 2:30 PM Medical Record [id number] Patient Account Number: [id number] Date of Birth/Sex: Treating RN: 1951/10/07 (68 y.o. Judie Petit) [name], [name] Primary Care Provider: Nicoletta Ba Other Clinician: Referring Provider: Treating Provider/Extender:Marvon [place], [name], [name] Weeks in Treatment: 189 Active Problems ICD-10 Evaluated Encounter Code Description Active Date Today Diagnosis L97.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 right calf limited to 06/30/2018 No Yes breakdown of skin L97.221 Non-pressure chronic ulcer of left calf limited to 09/30/2016 No Yes breakdown of skin I87.333 Chronic venous hypertension (idiopathic) with ulcer 01/22/2016 No Yes and inflammation of bilateral lower extremity I89.0 Lymphedema, not elsewhere classified 01/22/2016 No Yes E11.622 Type 2 diabetes mellitus with other skin ulcer 01/22/2016 No Yes 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, 01/22/2016 No Yes unspecified L03.116 Cellulitis of left lower limb 04/01/2017 No Yes Inactive Problems ICD-10 Code Description Active Date Inactive Date 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 06/30/2017 skin L97.521 Non-pressure chronic ulcer of other part of left foot limited to 04/27/2018 04/27/2018 breakdown of skin L03.115 Cellulitis of right lower limb 12/22/2017 12/22/2017 L97.228  Non-pressure chronic ulcer of left calf with other specified 06/30/2018 06/30/2018 severity L97.511 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 06/30/2018 06/30/2018 breakdown of skin Resolved Problems Electronic Signature(s) Signed: 09/07/2019 6:03:12 PM By: Baltazar Najjar MD Entered By: Baltazar Najjar on 09/07/2019 17:11:52 -------------------------------------------------------------------------------- Progress Note Details Patient Name: Date of Service: [name] 09/07/2019 2:30 PM Medical Record [id number] Patient Account Number: [id number] Date of Birth/Sex: Treating RN: [birth year] (68 y.o. Judie Petit) [name] Primary Care Provider: Nicoletta Ba Other Clinician: Referring Provider: Treating Provider/Extender:Diedra Sinor, [name], [name] Weeks in Treatment: 189 Subjective History of Present Illness (HPI) Referred by PCP for consultation. Patient has long standing history of BLE venous stasis, no prior ulcerations. At beginning of month, developed cellulitis and weeping. Received IM Rocephin followed by Keflex and resolved. Wears compression stocking, appr 6 months old. Not sure strength. No present drainage. 01/22/16 this is a patient who is a type II diabetic on insulin. 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 He tells me he religiously wears pressure stockings of uncertain strength. He was here with weeping edema about 8 months ago but did not have an open wound. Roughly a month ago he had a reopening on his bilateral legs. He is been using bandages and Neosporin. He does not complain of pain. 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 Last BUN/creatinine I have is from 11/20/15 at 13 and 1.0 respectively 01/29/16; patient arrives today having tolerated the Profore wrap. He brought in his stockings and these  are 18 mmHg stockings he bought from [place]. The compression here is likely inadequate. He does not complain of pain or excessive drainage she has no systemic symptoms. The wound on the right looks improved as does the one on the left although one on the left is more substantial with still tissue at risk below the actual wound area on the bilateral posterior calf 02/05/16; patient arrives with poor edema control. He states  that we did put a 4 layer compression on it last week. No weight appear 5 this. 02/12/16; the area on the posterior right Has healed. The left Has a substantial wound that has necrotic surface eschar that requires a debridement with a curette. 02/16/16;the patient called or a Nurse visit secondary to increased swelling. He had been in earlier in the week with his right leg healed. He was transitioned to is on pressure stocking on the right leg with the only open wound on the left, a substantial area on the left posterior calf. Note he has a history of severe lower extremity edema, he has a history of chronic atrial fibrillation but not heart failure per my notes but I'll need to research this. He is not complaining of chest pain shortness of breath or orthopnea. The intake nurse noted blisters on the previously closed right leg 02/19/16; this is the patient's regular visit day. I see him on Friday with escalating edema new wounds on the right leg and clear signs of at least right ventricular heart failure. I increased his Lasix to 40 twice a day. He is returning currently in follow-up. States he is noticed a decrease in that the edema 02/26/16 patient's legs have much less edema. There is nothing really open on the right leg. The left leg has improved condition of the large superficial wound on the posterior left leg 03/04/16; edema control is very much better. The patient's right leg wounds have healed. On the left leg he continues to have severe venous inflammation on the posterior  aspect of the left leg. There is no tenderness and I don't think any of this is cellulitis. 03/11/16; patient's right leg is married healed and he is in his own stocking. The patient's left leg has deteriorated somewhat. There is a lot of erythema around the wound on the posterior left leg. There is also a significant rim of erythema posteriorly just above where the wrap would've ended there is a new wound in this location and a lot of tenderness. Can't rule out cellulitis in this area. 03/15/16; patient's right leg remains healed and he is in his own stocking. The patient's left leg is much better than last review. His major wound on the posterior aspect of his left Is almost fully epithelialized. He has 3 small injuries from the wraps. Really. Erythema seems a lot better on antibiotics 03/18/16; right leg remains healed and he is in his own stocking. The patient's left leg is much better. The area on the posterior aspect of the left calf is fully epithelialized. His 3 small injuries which were wrap injuries on the left are improved only one seems still open his erythema has resolved 03/25/16; patient's right leg remains healed and he is in his own stocking. There is no open area today on the left leg posterior leg is completely closed up. His wrap injuries at the superior aspect of his leg are also resolved. He looks as though he has some irritation on the dorsal ankle but this is fully epithelialized without evidence of infection. 03/28/16; we discharged this patient on Monday. Transitioned him into his own stocking. There were problems almost immediately with uncontrolled swelling weeping edema multiple some of which have opened. He does not feel systemically unwell in particular no chest pain no shortness of breath and he does not feel 04/08/16; the edema is under better control with the Profore light wrap but he still has pitting edema. There is one large wound anteriorly  2 on the medial aspect of  his left leg and 3 small areas on the superior posterior calf. Drainage is not excessive he is tolerating a Profore light well 04/15/16; put a Profore wrap on him last week. This is controlled is edema however he had a lot of pain on his left anterior foot most of his wounds are healed 04/22/16 once again the patient has denuded areas on the left anterior foot which he states are because his wrap slips up word. He saw his primary physician today is on Lasix 40 twice a day and states that he his weight is down 20 pounds over the last 3 months. 04/29/16: Much improved. left anterior foot much improved. He is now on Lasix 80 mg per day. Much improved edema control 05/06/16; I was hoping to be able to discharge him today however once again he has blisters at a low level of where the compression was placed last week mostly on his left lateral but also his left medial leg and a small area on the anterior part of the left foot. 05/09/16; apparently the patient went home after his appointment on 7/4 later in the evening developing pain in his upper medial thigh together with subjective fever and chills although his temperature was not taken. The pain was so intense he felt he would probably have to call 911. However he then remembered that he had leftover doxycycline from a previous round of antibiotics and took these. By the next morning he felt a lot better. He called and spoke to one of our nurses and I approved doxycycline over the phone thinking that this was in relation to the wounds we had previously seen although they were definitely were not. The patient feels a lot better old fever no chills he is still working. Blood sugars are reasonably controlled 05/13/16; patient is back in for review of his cellulitis on his anterior medial upper thigh. He is taking doxycycline this is a lot better. Culture I did of the nodular area on the dorsal aspect of his foot grew MRSA this also looks a  lot better. 05/20/16; the patient is cellulitis on the medial upper thigh has resolved. All of his wound areas including the left anterior foot, areas on the medial aspect of the left calf and the lateral aspect of the calf at all resolved. He has a new blister on the left dorsal foot at the level of the fourth toe this was excised. No evidence of infection 05/27/16; patient continues to complain weeping edema. He has new blisterlike wounds on the left anterior lateral and posterior lateral calf at the top of his wrap levels. The area on his left anterior foot appears better. He is not complaining of fever, pain or pruritus in his feet. 05/30/16; the patient's blisters on his left anterior leg posterior calf all look improved. He did not increase the Lasix 100 mg as I suggested because he was going to run out of his 40 mg tablets. He is still having weeping edema of his toes 06/03/16; I renewed his Lasix at 80 mg once a day as he was about to run out when I last saw him. He is on 80 mg of Lasix now. I have asked him to cut down on the excessive amount of water he was drinking and asked him to drink according to his thirst mechanisms 06/12/2016 -- was seen 2 days ago and was supposed to wear his compression stockings at home but he is  developed lymphedema and superficial blisters on the left lower extremity and hence came in for a review 06/24/16; the remaining wound is on his left anterior leg. He still has edema coming from between his toes. There is lymphedema here however his edema is generally better than when I last saw this. He has a history of atrial fibrillation but does not have a known history of congestive heart failure nevertheless I think he probably has this at least on a diastolic basis. 07/01/16 I reviewed his echocardiogram from January 2017. This was essentially normal. He did not have LVH, EF of 55-60%. His right ventricular function was normal although he did have trivial tricuspid  and pulmonic regurgitation. This is not audible on exam however. I increased his Lasix to do massive edema in his legs well above his knees I think in early July. He was also drinking an excessive amount of water at the time. 07/15/16; missed his appointment last week because of the Labor Day holiday on Monday. He could not get another appointment later in the week. Started to feel the wrap digging in superiorly so we remove the top half and the bottom half of his wrap. He has extensive erythema and blistering superiorly in the left leg. Very tender. Very swollen. Edema in his foot with leaking edema fluid. He has not been systemically unwell 07/22/16; the area on the left leg laterally required some debridement. The medial wounds look more stable. His wrap injury wounds appear to have healed. Edema and his foot is better, weeping edema is also better. He tells me he is meeting with the supplier of the external compression pumps at work 08/05/16; the patient was on vacation last week in [facility]. His wrap is been on for an extended period of time. Also over the weekend he developed an extensive area of tender erythema across his anterior medial thigh. He took to doxycycline yesterday that he had leftover from a previous prescription. The patient complains of weeping edema coming out of his toes 08/08/16; I saw this patient on 10/2. He was tender across his anterior thigh. I put him on doxycycline. He returns today in follow-up. He does not have any open wounds on his lower leg, he still has edema weeping into his toes. 08/12/16; patient was seen back urgently today to follow-up for his extensive left thigh cellulitis/erysipelas. He comes back with a lot less swelling and erythema pain is much better. I believe I gave him Augmentin and Cipro. His wrap was cut down as he stated a roll down his legs. He developed blistering above the level of the wrap that remained. He has 2 open blisters and 1  intact. 08/19/16; patient is been doing his primary doctor who is increased his Lasix from 40-80 once a day or 80 already has less edema. Cellulitis has remained improved in the left thigh. 2 open areas on the posterior left calf 08/26/16; he returns today having new open blisters on the anterior part of his left leg. He has his compression pumps but is not yet been shown how to use some vital representative from the supplier. 09/02/16 patient returns today with no open wounds on the left leg. Some maceration in his plantar toes 09/10/2016 -- Dr. Leanord Hawking had recently discharged him on 09/02/2016 and he has come right back with redness swelling and some open ulcers on his left lower extremity. He says this was caused by trying to apply his compression stockings and he's been unable to use  this and has not been able to use his lymphedema pumps. He had some doxycycline leftover and he has started on this a few days ago. 09/16/16; there are no open wounds on his leg on the left and no evidence of cellulitis. 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 He does not complain of symptoms here. I am not clear use using his external compression pumps. 09/23/16; I have not seen this patient in 2 weeks. He canceled his appointment 10 days ago as he was going on vacation. He tells me that on Monday he noticed a large area on his posterior left leg which is been draining copiously and is reopened into a large wound. He is been using ABDs and the external part of his juxtalite, according to our nurse this was not on properly. 10/07/16; Still a substantial area on the posterior left leg. Using silver alginate 10/14/16; in general better although there is still open area which looks healthy. Still using silver alginate. He reminds me that this happen before he left for [facility]. Today while he was showering in the morning. He had been using his  juxtalite's 10/21/16; the area on his posterior left leg is fully epithelialized. However he arrives today with a large area of tender erythema in his medial and posterior left thigh just above the knee. I have marked the area. Once again he is reluctant to consider hospitalization. I treated him with oral antibiotics in the past for a similar situation with resolution I think with doxycycline however this area it seems more extensive to me. He is not complaining of fever but does have chills and says states he is thirsty. His blood sugar today was in the 140s at home 10/25/16 the area on his posterior left leg is fully epithelialized although there is still some weeping edema. The large area of tenderness and erythema in his medial and posterior left thigh is a lot less tender although there is still a lot of swelling in this thigh. He states he feels a lot better. He is on doxycycline and Augmentin that I started last week. This will continued until Tuesday, December 26. I have ordered a duplex ultrasound of the left thigh rule out DVT whether there is an abscess something that would need to be drained I would also like to know. 11/01/16; he still has weeping edema from a not fully epithelialized area on his left posterior calf. Most of the rest of this looks a lot better. He has completed his antibiotics. His thigh is a lot better. Duplex ultrasound did not show a DVT in the thigh 11/08/16; he comes in today with more Denuded surface epithelium from the posterior aspect of his calf. There is no real evidence of cellulitis. The superior aspect of his wrap appears to have put quite an indentation in his leg just below the knee and this may have contributed. He does not complain of pain or fever. We have been using silver alginate as the primary dressing. The area of cellulitis in the right thigh has totally resolved. He has been using his compression stockings once a week 11/15/16; the patient  arrives today with more loss of epithelium from the posterior aspect of his left calf. He now has a fairly substantial wound in this area. The reason behind this deterioration isn't exactly clear although his edema is not well controlled. He states he feels he is generally more swollen systemically. He  is not complaining of chest pain shortness of breath fever. Tells me he has an appointment with his primary physician in early February. He is on 80 mg of oral Lasix a day. He claims compliance with the external compression pumps. He is not having any pain in his legs similar to what he has with his recurrent cellulitis 11/22/16; the patient arrives a follow-up of his large area on his left lateral calf. This looks somewhat better today. He came in earlier in the week for a dressing change since I saw him a week ago. He is not complaining of any pain no shortness of breath no chest pain 11/28/16; the patient arrives for follow-up of his large area on the left lateral calf this does not look better. In fact it is larger weeping edema. The surface of the wound does not look too bad. We have been using silver alginate although I'm not certain that this is a dressing issue. 12/05/16; again the patient follows up for a large wound on the left lateral and left posterior calf this does not look better. There continues to be weeping edema necrotic surface tissue. More worrisome than this once again there is erythema below the wound involving the distal Achilles and heel suggestive of cellulitis. He is on his feet working most of the day of this is not going well. We are changing his dressing twice a week to facilitate the drainage. 12/12/16; not much change in the overall dimensions of the large area on the left posterior calf. This is very inflamed looking. I gave him an. Doxycycline last week does not really seem to have helped. He found the wrap very painful indeed it seems to of dog into his legs superiorly  and perhaps around the heel. He came in early today because the drainage had soaked through his dressings. 12/19/16- patient arrives for follow-up evaluation of his left lower extremity ulcers. He states that he is using his lymphedema pumps once daily when there is "no drainage". He admits to not using his lipedema pumps while under current treatment. His blood sugars have been consistently between 150-200. 12/26/16; the patient is not using his compression pumps at home because of the wetness on his feet. I've advised him that I think it's important for him to use this daily. He finds his feet too wet, he can put a plastic bag over his legs while he is in the pumps. Otherwise I think will be in a vicious circle. We are using silver alginate to the major area on his left posterior calf 01/02/17; the patient's posterior left leg has further of all into 3 open wounds. All of them covered with a necrotic surface. He claims to be using his compression pumps once a day. His edema control is marginal. Continue with silver alginate 01/10/17; the patient's left posterior leg actually looks somewhat better. There is less edema, less erythema. Still has 3 open areas covered with a necrotic surface requiring debridement. He claims to be using his compression pumps once a day his edema control is better 01/17/17; the patient's left posterior calf look better last week when I saw him and his wrap was changed 2 days ago. He has noted increasing pain in the left heel and arrives today with much larger wounds extensive erythema extending down into the entire heel area especially tender medially. He is not systemically unwell CBGs have been controlled no fever. Our intake nurse showed me limegreen drainage on his AVD pads. 01/24/17; his  usual this patient responds nicely to antibiotics last week giving him Levaquin for presumed Pseudomonas. The whole entire posterior part of his leg is much better much less inflamed and  in the case of his Achilles heel area much less tender. He has also had some epithelialization posteriorly there are still open areas here and still draining but overall considerably better 01/31/17- He has continue to tolerate the compression wraps. he states that he continues to use the lymphedema pumps daily, and can increase to twice daily on the weekends. He is voicing no complaints or concerns regarding his LLE ulcers 02/07/17-he is here for follow-up evaluation. He states that he noted some erythema to the left medial and anterior thigh, which he states is new as of yesterday. He is concerned about recurrent cellulitis. He states his blood sugars have been slightly elevated, this morning in the 180s 02/14/17; he is here for follow-up evaluation. When he was last here there was erythema superiorly from his posterior wound in his anterior thigh. He was prescribed Levaquin however a culture of the wound surface grew MRSA over the phone I changed him to doxycycline on Monday and things seem to be a lot better. 02/24/17; patient missed his appointment on Friday therefore we changed his nurse visit into a physician visit today. Still using silver alginate on the large area of the posterior left thigh. He isn't new area on the dorsal left second toe 03/03/17; actually better today although he admits he has not used his external compression pumps in the last 2 days or so because of work responsibilities over the weekend. 03/10/17; continued improvement. External compression pumps once a day almost all of his wounds have closed on the posterior left calf. Better edema control 03/17/17; in general improved. He still has 3 small open areas on the lateral aspect of his left leg however most of the area on the posterior part of his leg is epithelialized. He has better edema control. He has an ABD pad under his stocking on the right anterior lower leg although he did not let us look at that today. 03/24/17;  patient arrives back in clinic today with no open areas however there are areas on the posterior left calf and anterior left calf that are less than 100% epithelialized. His edema is well controlled in the left lower leg. There is some pitting edema probably lymphedema in the left upper thigh. He uses compression pumps at home once per day. I tried to get him to do this twice a day although he is very reticent. 04/01/2017 -- for the last 2 days he's had significant redness, tenderness and weeping and came in for an urgent visit today. 04/07/17; patient still has 6 more days of doxycycline. He was seen by Dr. Meyer Russel last Wednesday for cellulitis involving the posterior aspect, lateral aspect of his Involving his heel. For the most part he is better there is less erythema and less weeping. He has been on his feet for 12 hours o2 over the weekend. Using his compression pumps once a day 04/14/17 arrives today with continued improvement. Only one area on the posterior left calf that is not fully epithelialized. He has intense bilateral venous inflammation associated with his chronic venous insufficiency disease and secondary lymphedema. We have been using silver alginate to the left posterior calf wound In passing he tells [place] today that the right leg but we have not seen in quite some time has an open area on it but he doesn't  want [place] to look at this today states he will show this to [place] next week. 04/21/17; there is no open area on his left leg although he still reports some weeping edema. He showed [place] his right leg today which is the first time we've seen this leg in a long time. He has a large area of open wound on the right leg anteriorly healthy granulation. Quite a bit of swelling in the right leg and some degree of venous inflammation. He told us about the right leg in passing last week but states that deterioration in the right leg really only happened over the weekend 04/28/17; there is no open  area on the left leg although there is an irritated part on the posterior which is like a wrap injury. The wound on the right leg which was new from last week at least to [place] is a lot better. 05/05/17; still no open area on the left leg. Patient is using his new compression stocking which seems to be doing a good job of controlling the edema. He states he is using his compression pumps once per day. The right leg still has an open wound although it is better in terms of surface area. Required debridement. A lot of pain in the posterior right Achilles marked tenderness. Usually this type of presentation this patient gives concern for an active cellulitis 05/12/17; patient arrives today with his major wound from last week on the right lateral leg somewhat better. Still requiring debridement. He was using his compression stocking on the left leg however that is reopened with superficial wounds anteriorly he did not have an open wound on this leg previously. He is still using his juxta light's once daily at night. He cannot find the time to do this in the morning as he has to be at work by 7 AM 05/19/17; right lateral leg wound looks improved. No debridement required. The concerning area is on the left posterior leg which appears to almost have a subcutaneous hemorrhagic component to it. We've been using silver alginate to all the wounds 05/26/17; the right lateral leg wound continues to look improved. However the area on the left posterior calf is a tightly adherent surface. [place] using silver alginate. Because of the weeping edema in his legs there is very little good alternatives. 06/02/17; the patient left here last week looking quite good. Major wound on the left posterior calf and a small one on the right lateral calf. Both of these look satisfactory. He tells me that by Wednesday he had noted increased pain in the left leg and drainage. He called on Thursday and Friday to get an appointment here but  we were blocked. He did not go to urgent care or his primary physician. He thinks he had a fever on Thursday but did not actually take his temperature. He has not been using his compression pumps on the left leg because of pain. I advised him to go to the emergency room today for IV antibiotics for stents of left leg cellulitis but he has refused I have asked him to take 2 days off work to keep his leg elevated and he has refused this as well. In view of this I'm going to call him and Augmentin and doxycycline. He tells me he took some leftover doxycycline starting on Friday previous cultures of the left leg have grown MRSA 06/09/2017 -- the patient has florid cellulitis of his left lower extremity with copious amount of drainage and there  is no doubt in my mind that he needs inpatient care. However after a detailed discussion regarding the risk benefits and alternatives he refuses to get admitted to the hospital. With no other recourse I will continue him on oral antibiotics as before and hopefully he'll have his infectious disease consultation this week. 06/16/2017 -- the patient was seen today by the nurse practitioner at infectious disease Ms. Dixon. 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 He has also been advise Lotrimin cream locally. she also had wise IV antibiotics for his condition if it flares up 06/23/17; patient arrives today with drainage bilaterally although the remaining wound on the left posterior calf after cleaning up today "highlighter yellow drainage" did not look too bad. Unfortunately he has had breakdown on the right anterior leg [previously this leg had not been open and he is using a black stocking] he went to see infectious disease and is been put on clindamycin 150 mg daily, I did not  verify the dose although I'm not familiar with using clindamycin in this dosing range, perhaps for prophylaxisoo 06/27/17; I brought this patient back today to follow-up on the wound deterioration on the right lower leg together with surrounding cellulitis. I started him on doxycycline 4 days ago. This area looks better however he comes in today with intense cellulitis on the medial part of his left thigh. This is not have a wound in this area. Extremely tender. We've been using silver alginate to the wounds on the right lower leg left lower leg with bilateral 4 layer compression he is using his external compression pumps once a day 07/04/17; patient's left medial thigh cellulitis looks better. He has not been using his compression pumps as his insert said it was contraindicated with cellulitis. His right leg continues to make improvements all the wounds are still open. We only have one remaining wound on the left posterior calf. Using silver alginate to all open areas. He is on doxycycline which I started a week ago and should be finishing I gave him Augmentin after Thursday's visit for the severe cellulitis on the left medial thigh which fortunately looks better 07/14/17; the patient's left medial thigh cellulitis has resolved. The cellulitis in his right lower calf on the right also looks better. All of his wounds are stable to improved we've been using silver alginate he has completed the antibiotics I have given him. He has clindamycin 150 mg once a day prescribed by infectious disease for prophylaxis, I've advised him to start this now. We have been using bilateral Unna boots over silver alginate to the wound areas 07/21/17; the patient is been to see infectious disease who noted his recurrent problems with cellulitis. He was not able to tolerate prophylactic clindamycin therefore he is on amoxicillin 500 twice a day. He also had a second daily dose of Lasix added By Dr. Oneta Rack but he is not  taking this. Nor is he being completely compliant with his compression pumps a especially not this week. He has 2 remaining wounds one on the right posterior lateral lower leg and one on the left posterior medial lower leg. 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 The patient has Unna boots bilaterally. Still wounds on his right lateral, left  medial, and a new open area on the left anterior lateral lower leg 08/04/17; he remains on amoxicillin twice a day for prophylaxis of recurrent cellulitis. He has bilateral Unna boots for compression and silver alginate to his wounds. Arrives today with his legs looking as good as I have seen him in quite some time. Not surprisingly his wounds look better as well with improvement on the right lateral leg venous insufficiency wound and also the left medial leg. He is still using the compression pumps once a day 08/11/17; both legs appear to be doing better wounds on the right lateral and left medial legs look better. Skin on the right leg quite good. He is been using silver alginate as the primary dressing. I'm going to use Anasept gel calcium alginate and maintain all the secondary dressings 08/18/17; the patient continues to actually do quite well. The area on his right lateral leg is just about closed the left medial also looks better although it is still moist in this area. His edema is well controlled we have been using Anasept gel with calcium alginate and the usual secondary dressings, 4 layer compression and once daily use of his compression pumps "always been able to manage 09/01/17; the patient continues to do reasonably well in spite of his trip to [place]. The area on the right lateral leg is epithelialized. Left is much better but still open. He has more edema and more chronic erythema on the left leg [venous inflammation] 09/08/17; he arrives today with no open wound on  the right lateral leg and decently controlled edema. Unfortunately his left leg is not nearly as in his good situation as last week.he apparently had increasing edema starting on Saturday. He edema soaked through into his foot so used a plastic bag to walk around his home. The area on the medial right leg which was his open area is about the same however he has lost surface epithelium on the left lateral which is new and he has significant pain in the Achilles area of the left foot. He is already on amoxicillin chronically for prophylaxis of cellulitis in the left leg 09/15/17; he is completed a week of doxycycline and the cellulitis in the left posterior leg and Achilles area is as usual improved. He still has a lot of edema and fluid soaking through his dressings. There is no open wound on the right leg. He saw infectious disease NP today 09/22/17;As usual 1 we transition him from our compression wraps to his stockings things did not go well. He has several small open areas on the right leg. He states this was caused by the compression wrap on his skin although he did not wear this with the stockings over them. He has several superficial areas on the left leg medially laterally posteriorly. He does not have any evidence of active cellulitis especially involving the left Achilles The patient is traveling from [facility] Saturday going to [facility]. He states he isn't attempting to get an appointment with a heel objects wound center there to change his dressings. I am not completely certain whether this will work 10/06/17; the patient came in on Friday for a nurse visit and the nurse reported that his legs actually look quite good. He arrives in clinic today for his regular follow-up visit. He has a new wound on his left third toe over the PIP probably caused by friction with his footwear. He has small areas on the left leg and a very superficial but  epithelialized area on the right anterior  lateral lower leg. Other than that his legs look as good as I've seen him in quite some time. We have been using silver alginate Review of systems; no chest pain no shortness of breath other than this a 10 point review of systems negative 10/20/17; seen by Dr. Meyer Russel last week. He had taken some antibiotics [doxycycline] that he had left over. Dr. Meyer Russel thought he had candida infection and declined to give him further antibiotics. He has a small wound remaining on the right lateral leg several areas on the left leg including a larger area on the left posterior several left medial and anterior and a small wound on the left lateral. The area on the left dorsal third toe looks a lot better. ROS; Gen.; no fever, respiratory no cough no sputum Cardiac no chest pain other than this 10 point review of system is negative 10/30/17; patient arrives today having fallen in the bathtub 3 days ago. It took him a while to get up. He has pain and maceration in the wounds on his left leg which have deteriorated. He has not been using his pumps he also has some maceration on the right lateral leg. 11/03/17; patient continues to have weeping edema especially in the left leg. This saturates his dressings which were just put on on 12/27. As usual the doxycycline seems to take care of the cellulitis on his lower leg. He is not complaining of fever, chills, or other systemic symptoms. He states his leg feels a lot better on the doxycycline I gave him empirically. He also apparently gets injections at his primary doctor's officeo Rocephin for cellulitis prophylaxis. I didn't ask him about his compression pump compliance today I think that's probably marginal. Arrives in the clinic with all of his dressings primary and secondary macerated full of fluid and he has bilateral edema 11/10/17; the patient's right leg looks some better although there is still a cluster of wounds on the right lateral. The left leg is inflamed  with almost circumferential skin loss medially to laterally although we are still maintaining anteriorly. He does not have overt cellulitis there is a lot of drainage. He is not using compression pumps. We have been using silver alginate to the wound areas, there are not a lot of options here 11/17/17; the patient's right leg continues to be stable although there is still open wounds, better than last week. The inflammation in the left leg is better. Still loss of surface layer epithelium especially posteriorly. There is no overt cellulitis in the amount of edema and his left leg is really quite good, tells me he is using his compression pumps once a day. 11/24/17; patient's right leg has a small superficial wound laterally this continues to improve. The inflammation in the left leg is still improving however we have continuous surface layer epithelial loss posteriorly. There is no overt cellulitis in the amount of edema in both legs is really quite good. He states he is using his compression pumps on the left leg once a day for 5 out of 7 days 12/01/17; very small superficial areas on the right lateral leg continue to improve. Edema control in both legs is better today. He has continued loss of surface epithelialization and left posterior calf although I think this is better. We have been using silver alginate with large number of absorptive secondary dressings 4 layer on the left Unna boot on the right at his request. He tells me  he is using his compression pumps once a day 12/08/17; he has no open area on the right leg is edema control is good here. ooOn the left leg however he has marked erythema and tenderness breakdown of skin. He has what appears to be a wrap injury just distal to the popliteal fossa. This is the pattern of his recurrent cellulitis area and he apparently received penicillin at his primary physician's office really worked in my view but usually response to doxycycline given it  to him several times in the past 12/15/17; the patient had already deteriorated last Friday when he came in for his nurse check. There was swelling erythema and breakdown in the right leg. He has much worse skin breakdown in the left leg as well multiple open areas medially and posteriorly as well as laterally. He tells me he has been using his compression pumps but tells me he feels that the drainage out of his leg is worse when he uses a compression pumps. To be fair to him he is been saying this for a while however I don't know that I have really been listening to this. I wonder if the compression pumps are working properly 12/22/17;. Once again he arrives with severe erythema, weeping edema from the left greater than right leg. Noncompliance with compression pumps. New this visit he is complaining of pain on the lateral aspect of the right leg and the medial aspect of his right thigh. He apparently saw his cardiologist Dr. [name] who was ordered an echocardiogram area and I think this is a step in the right direction 12/25/17; started his doxycycline Monday night. There is still intense erythema of the right leg especially in the anterior thigh although there is less tenderness. The erythema around the wound on the right lateral calf also is less tender. He still complaining of pain in the left heel. His wounds are about the same right lateral left medial left lateral. Superficial but certainly not close to closure. He denies being systemically unwell no fever chills no abdominal pain no diarrhea 12/29/17; back in follow-up of his extensive right calf and right thigh cellulitis. I added amoxicillin to cover possible doxycycline resistant strep. This seems to of done the trick he is in much less pain there is much less erythema and swelling. He has his echocardiogram at 11:00 this morning. X-ray of the left heel was also negative. 01/05/18; the patient arrived with his edema under much better control.  Now that he is retired he is able to use his compression pumps daily and sometimes twice a day per the patient. He has a wound on the right leg the lateral wound looks better. Area on the left leg also looks a lot better. He has no evidence of cellulitis in his bilateral thighs I had a quick peak at his echocardiogram. He is in normal ejection fraction and normal left ventricular function. He has moderate pulmonary hypertension moderately reduced right ventricular function. One would have to wonder about chronic sleep apnea although he says he doesn't snore. He'll review the echocardiogram with his cardiologist. 01/12/18; the patient arrives with the edema in both legs under exemplary control. He is using his compression pumps daily and sometimes twice daily. His wound on the right lateral leg is just about closed. He still has some weeping areas on the posterior left calf and lateral left calf although everything is just about closed here as well. I have spoken with [name] who is the patient's nurse  practitioner and infectious disease. She was concerned that the patient had not understood that the parenteral penicillin injections he was receiving for cellulitis prophylaxis was actually benefiting him. I don't think the patient actually saw that I would tend to agree we were certainly dealing with less infections although he had a serious one last month. 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 He is healed. He'll be placed in juxtalite compression wraps and increase his lymphedema pumps to twice daily. We will follow up again next week to ensure there are no issues with the new regiment. 01/20/18-he is here for evaluation of bilateral lower extremity weeping edema. Yesterday he was placed in compression wrap to the right lower extremity and compression stocking to left lower shrubbery. He states he uses lymphedema pumps last night and again this morning  and noted a blister to the left lower extremity. On exam he was noted to have drainage to the right lower extremity. He will be placed in Unna boots bilaterally and follow-up next week 01/26/18; patient was actually discharged a week ago to his own juxta light stockings only to return the next day with bilateral lower extremity weeping edema.he was placed in bilateral Unna boots. He arrives today with pain in the back of his left leg. There is no open area on the right leg however there is a linear/wrap injury on the left leg and weeping edema on the left leg posteriorly. I spoke with infectious disease about 10 days ago. They were disappointed that the patient elected to discontinue prophylactic intramuscular penicillin shots as they felt it was particularly beneficial in reducing the frequency of his cellulitis. I discussed this with the patient today. He does not share this view. He'll definitely need antibiotics today. Finally he is traveling to [place] and trauma leaving this Saturday and returning a week later and he does not travel with his pumps. He is going by car 01/30/18; patient was seen 4 days ago and brought back in today for review of cellulitis in the left leg posteriorly. I put him on amoxicillin this really hasn't helped as much as I might like. He is also worried because he is traveling to [facility] trauma by car. Finally we will be rewrapping him. There is no open area on the right leg over his left leg has multiple weeping areas as usual 02/09/18; The same wrap on for 10 days. He did not pick up the last doxycycline I prescribed for him. He apparently took 4 days worth he already had. There is nothing open on his right leg and the edema control is really quite good. He's had damage in the left leg medially and laterally especially probably related to the prolonged use of Unna boots 02/12/18; the patient arrived in clinic today for a nurse visit/wrap change. He complained of a lot  of pain in the left posterior calf. He is taking doxycycline that I previously prescribed for him. Unfortunately even though he used his stockings and apparently used to compression pumps twice a day he has weeping edema coming out of the lateral part of his right leg. This is coming from the lower anterior lateral skin area. 02/16/18; the patient has finished his doxycycline and will finish the amoxicillin 2 days. The area of cellulitis in the left calf posteriorly has resolved. He is no longer having any pain. He tells me he is using his compression pumps at least once a day sometimes twice. 02/23/18; the patient finished  his doxycycline and Amoxil last week. On Friday he noticed a small erythematous circle about the size of a quarter on the left lower leg just above his ankle. This rapidly expanded and he now has erythema on the lateral and posterior part of the thigh. This is bright red. Also has an area on the dorsal foot just above his toes and a tender area just below the left popliteal fossa. He came off his prophylactic penicillin injections at his own insistence one or 2 months ago. This is obviously deteriorated since then 03/02/18; patient is on doxycycline and Amoxil. Culture I did last week of the weeping area on the back of his left calf grew group B strep. I have therefore renewed the amoxicillin 500 3 times a day for a further week. He has not been systemically unwell. Still complaining of an area of discomfort right under his left popliteal fossa. There is no open wound on the right leg. He tells me that he is using his pumps twice a day on most days 03/09/18; patient arrives in clinic today completing his amoxicillin today. The cellulitis on his left leg is better. Furthermore he tells me that he had intramuscular penicillin shots that his primary care office today. However he also states that the wrap on his right leg fell down shortly after leaving clinic last week. He developed a  large blister that was present when he came in for a nurse visit later in the week and then he developed intense discomfort around this area.He tells me he is using his compression pumps 03/16/18; the patient has completed his doxycycline. The infectious part of this/cellulitis in the left heel area left popliteal area is a lot better. He has 2 open areas on the right calf. Still areas on the left calf but this is a lot better as well. 03/24/18; the patient arrives complaining of pain in the left popliteal area again. He thinks some of this is wrap injury. He has no open area on the right leg and really no open area on the left calf either except for the popliteal area. He claims to be compliant with the compression pumps 03/31/18; I gave him doxycycline last week because of cellulitis in the left popliteal area. This is a lot better although the surface epithelium is denuded off and response to this. He arrives today with uncontrolled edema in the right calf area as well as a fingernail injury in the right lateral calf. There is only a few open areas on the left 04/06/18; I gave him amoxicillin doxycycline over the last 2 weeks that the amoxicillin should be completing currently. He is not complaining of any pain or systemic symptoms. The only open areas see has is on the right lateral lower leg paradoxically I cannot see anything on the left lower leg. He tells me he is using his compression pumps twice a day on most days. Silver alginate to the wounds that are open under 4 layer compression 04/13/18; he completed antibiotics and has no new complaints. Using his compression pumps. Silver alginate that anything that's opened 04/20/18; he is using his compression pumps religiously. Silver alginate 4 layer compression anything that's opened. He comes in today with no open wounds on the left leg but 3 on the right including a new one posteriorly. He has 2 on the right lateral and one on the right  posterior. He likes Unna boots on the right leg for reasons that aren't really clear we had the  usual 4 layer compression on the left. It may be necessary to move to the 4 layer compression on the right however for now I left them in the Unna boots 04/27/18; he is using his compression pumps at least once a day. He has still the wounds on the right lateral calf. The area right posteriorly has closed. He does not have an open wound on the left under 4 layer compression however on the dorsal left foot just proximal to the toes and the left third toe 2 small open areas were identified 05/11/18; he has not uses compression pumps. The areas on the right lateral calf have coalesced into one large wound necrotic surface. On the left side he has one small wound anteriorly however the edema is now weeping out of a large part of his left leg. He says he wasn't using his pumps because of the weeping fluid. I explained to him that this is the time he needs to pump more 05/18/18; patient states he is using his compression pumps twice a day. The area on the right lateral large wound albeit superficial. On the left side he has innumerable number of small new wounds on the left calf particularly laterally but several anteriorly and medially. All these appear to have healthy granulated base these look like the remnants of blisters however they occurred under compression. The patient arrives in clinic today with his legs somewhat better. There is certainly less edema, less multiple open areas on the left calf and the right anterior leg looks somewhat better as well superficial and a little smaller. However he relates pain and erythema over the last 3-4 days in the thigh and I looked at this today. He has not been systemically unwell no fever no chills no change in blood sugar values 05/25/18; comes in today in a better state. The severe cellulitis on his left leg seems better with the Keflex. Not as tender. He has not  been systemically unwell ooHard to find an open wound on the left lower leg using his compression pumps twice a day ooThe confluent wounds on his right lateral calf somewhat better looking. These will ultimately need debridement I didn't do this today. 06/01/18; the severe cellulitis on the left anterior thigh has resolved and he is completed his Keflex. ooThere is no open wound on the left leg however there is a superficial excoriation at the base of the third toe dorsally. Skin on the bottom of his left foot is macerated looking. ooThe left the wounds on the lateral right leg actually looks some better although he did require debridement of the top half of this wound area with an open curet 06/09/18 on evaluation today patient appears to be doing poorly in regard to his right lower extremity in particular this appears to likely be infected he has very thick purulent discharge along with a bright green tent to the discharge. This makes me concerned about the possibility of pseudomonas. He's also having increased discomfort at this point on evaluation. Fortunately there does not appear to be any evidence of infection spreading to the other location at this time. 06/16/18 on evaluation today patient appears to actually be doing fairly well. His ulcer has actually diminished in size quite significantly at this point which is good news. Nonetheless he still does have some evidence of infection he did see infectious disease this morning before coming here for his appointment. I did review the results of their evaluation and their note today. They did  actually have him discontinue the Cipro and initiate treatment with linezolid at this time. He is doing this for the next seven days and they recommended a follow-up in four months with them. He is the keep a log of the need for intermittent antibiotic therapy between now and when he falls back up with infectious disease. This will help them gaze what  exactly they need to do to try and help them out. 06/23/18; the patient arrives today with no open wounds on the left leg and left third toe healed. He is been using his compression pumps twice a day. On the right lateral leg he still has a sizable wound but this is a lot better than last time I saw this. 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 Has been using silver alginate under 4 layer compression 06/30/18; the only open wound he has is on the right lateral leg and this looks healthy. No debridement is required. We have been using silver alginate. He does not have an open wound on the left leg. There is apparently some drainage from the dorsal proximal third toe on the left although I see no open wound here. 07/03/18 on evaluation today patient was actually here just for a nurse visit rapid change. However when he was here on Wednesday for his rat change due to having been healed on the left and then developing blisters we initiated the wrap again knowing that he would be back today for [place] to reevaluate and see were at. Unfortunately he has developed some cellulitis into the proximal portion of his right lower extremity even into the region of his thigh. 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 He was placed on one as what at that point. Nonetheless he is done with that and has been tolerating it well otherwise. Doxycycline which in the past really did not seem to be effective for him. Nonetheless I think the best option may be for [place] to definitely reinitiate the antibiotics for a longer period of time. 07/07/18; since I last saw this patient a week ago he has had a difficult time. At that point he did not have an open wound on his left leg. We transitioned him into juxta light stockings. He was apparently in the clinic the next day with blisters on  the left lateral and left medial lower calf. He also had weeping edema fluid. He was put back into a compression wrap. He was also in the clinic on Friday with intense erythema in his right thigh. Per the patient he was started on Bactrim however that didn't work at all in terms of relieving his pain and swelling. He has taken 3 doxycycline that he had left over from last time and that seems to of helped. He has blistering on the right thigh as well. 07/14/18; the erythema on his right thigh has gotten better with doxycycline that he is finishing. 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 Presumably this cellulitis in the thigh was not related to that although I think this is a bit concerning going forward. He still has an area on the right lateral calf the blister on the right medial calf just below the knee that was discussed above. On the left 2 small open  areas left medial and left lateral. Edema control is adequate. He is using his compression pumps twice a day 07/20/18; continued improvement in the condition of both legs especially the edema in his bilateral thighs. He tells me he is been losing weight through a combination of diet and exercise. He is using his compression pumps twice a day. So overall she made to the remaining wounds 07/27/2018; continued improvement in condition of both legs. His edema is well controlled. The area on the right lateral leg is just about closed he had one blisters show up on the medial left upper calf. We have him in 4 layer compression. He is going on a 10-day trip to [place], [place] and [place]. He will be driving. He wants to wear Unna boots because of the lessening amount of constriction. He will not use compression pumps while he is away 08/05/18 on evaluation today patient actually appears to be doing decently well all things considered in regard to his bilateral lower  extremities. The worst ulcer is actually only posterior aspect of his left lower extremity with a four layer compression wrap cut into his leg a couple weeks back. He did have a trip and actually had [employer] for the trip that he is worn since he was last here. Nonetheless he feels like the [employer] actually do better for him his swelling is up a little bit but he also with his trip was not taking his Lasix on a regular set schedule like he was supposed to be. He states that obviously the reason being that he cannot drive and keep going without having to urinate too frequently which makes it difficult. He did not have his pumps with him while he was away either which I think also maybe playing a role here too. 08/13/2018; the patient only has a small open wound on the right lateral calf which is a big improvement in the last month or 2. He also has the area posteriorly just below the posterior fossa on the left which I think was a wrap injury from several weeks ago. He has no current evidence of cellulitis. He tells me he is back into his compression pumps twice a day. He also tells me that while he was at the laundromat somebody stole a section of his extremitease stockings 08/20/2018; back in the clinic with a much improved state. He only has small areas on the right lateral mid calf which is just about healed. This was is more substantial area for quite a prolonged period of time. He has a small open area on the left anterior tibia. The area on the posterior calf just below the popliteal fossa is closed today. He is using his compression pumps twice a day 08/28/2018; patient has no open wound on the right leg. He has a smattering of open areas on the calf with some weeping lymphedema. More problematically than that it looks as though his wraps of slipped down in his usual he has very angry upper area of edema just below the right medial knee and on the right lateral calf. He has no  open area on his feet. The patient is traveling to [facility] next week. I will send him in an antibiotic. We will continue to wrap the right leg. We ordered extremitease stockings for him last week and I plan to transition the right leg to a stocking when he gets home which will be in 10 days time. As usual he  is very reluctant to take his pumps with him when he travels 09/07/2018; patient returns from [facility]. He shows me a picture of his left leg in the mid part of his trip last week with intense fire engine erythema. The picture look bad enough I would have considered sending him to the hospital. Instead he went to the wound care center in [facility]. They did not prescribe him antibiotics but he did take some doxycycline he had leftover from a previous visit. I had given him trimethoprim sulfamethoxazole before he left this did not work according to the patient. This is resulted in some improvement fortunately. He comes back with a large wound on the left posterior calf. Smaller area on the left anterior tibia. Denuded blisters on the dorsal left foot over his toes. Does not have much in the way of wounds on the right leg although he does have a very tender area on the right posterior area just below the popliteal fossa also suggestive of infection. He promises me he is back on his pumps twice a day 09/15/2018; the intense cellulitis in his left lower calf is a lot better. The wound area on the posterior left calf is also so better. However he has reasonably extensive wounds on the dorsal aspect of his second and third toes and the proximal foot just at the base of the toes. There is nothing open on the right leg 09/22/2018; the patient has excellent edema control in his legs bilaterally. He is using his external compression pumps twice a day. He has no open area on the right leg and only the areas in the left foot dorsally second and third toe area on the left side. He does  not have any signs of active cellulitis. 10/06/2018; the patient has good edema control bilaterally. He has no open wound on the right leg. There is a blister in the posterior aspect of his left calf that we had to deal with today. He is using his compression pumps twice a day. There is no signs of active cellulitis. We have been using silver alginate to the wound areas. He still has vulnerable areas on the base of his left first second toes dorsally He has a his extremities stockings and we are going to transition him today into the stocking on the right leg. He is cautioned that he will need to continue to use the compression pumps twice a day. If he notices uncontrolled edema in the right leg he may need to go to 3 times a day. 10/13/2018; the patient came in for a nurse check on Friday he has a large flaccid blister on the right medial calf just below the knee. We unroofed this. He has this and a new area underneath the posterior mid calf which was undoubtedly a blister as well. He also has several small areas on the right which is the area we put his extremities stocking on. 10/19/2018; the patient went to see infectious disease this morning I am not sure if that was a routine follow-up in any case the doxycycline I had given him was discontinued and started on linezolid. He has not started this. It is easy to look at his left calf and the inflammation and think this is cellulitis however he is very tender in the tissue just below the popliteal fossa and I have no doubt that there is infection going on here. He states the problem he is having is that with the compression pumps  the edema goes down and then starts walking the wrap falls down. We will see if we can adhere this. He has 1 or 2 minuscule open areas on the right still areas that are weeping on the posterior left calf, the base of his left second and third toes 10/26/18; back today in clinic with quite of skin breakdown in his left  anterior leg. This may have been infection the area below the popliteal fossa seems a lot better however tremendous epithelial loss on the left anterior mid tibia area over quite inexpensive tissue. He has 2 blisters on the right side but no other open wound here. 10/29/2018; came in urgently to see [place] today and we worked him in for review. He states that the 4 layer compression on the right leg caused pain he had to cut it down to roughly his mid calf this caused swelling above the wrap and he has blisters and skin breakdown today. As a result of the pain he has not been using his pumps. Both legs are a lot more edematous and there is a lot of weeping fluid. 11/02/18; arrives in clinic with continued difficulties in the right leg> left. Leg is swollen and painful. multiple skin blisters and new open areas especially laterally. He has not been using his pumps on the right leg. 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 He is not systemically unwell. 11/09/2018; the patient claims he is being compliant with his pumps. He is finished the doxycycline I gave him last week. Culture I did of the wound on the right lateral leg showed a few very resistant methicillin staph aureus. This was resistant to doxycycline. Nevertheless he states the pain in the leg is a lot better which makes me wonder if the cultured organism was not really what was causing the problem nevertheless this is a very dangerous organism to be culturing out of any wound. His right leg is still a lot larger than the left. He is using an Radio broadcast assistant on this area, he blames a 4-layer compression for causing the original skin breakdown which I doubt is true however I cannot talk him out of it. We have been using silver alginate to all of these areas which were initially blisters 11/16/2018; patient is being compliant with his external compression pumps at twice a day. Miraculously he arrives in  clinic today with absolutely no open wounds. He has better edema control on the left where he has been using 4 layer compression versus wound of wounds on the right and I pointed this out to him. There is no inflammation in the skin in his lower legs which is also somewhat unusual for him. There is no open wounds on the dorsal left foot. He has extremitease stockings at home and I have asked him to bring these in next week. 11/25/18 patient's lower extremity on examination today on the left appears for the most part to be wound free. He does have an open wound on the lateral aspect of the right lower extremity but this is minimal compared to what I've seen in past. He does request that we go ahead and wrap the left leg as well even though there's nothing open just so hopefully it will not reopen in short order. 1/28; patient has superficial open wounds on the right lateral calf left anterior calf and left posterior calf. His edema control is adequate. He has an area of very tender erythematous skin  at the superior upper part of his calf compatible with his recurrent cellulitis. We have been using silver alginate as the primary dressing. He claims compliance with his compression pumps 2/4; patient has superficial open wounds on numerous areas of his left calf and again one on the left dorsal foot. The areas on the right lateral calf have healed. The cellulitis that I gave him doxycycline for last week is also resolved this was mostly on the left anterior calf just below the tibial tuberosity. His edema looks fairly well-controlled. He tells me he went to see his primary doctor today and had blood work ordered 2/11; once again he has several open areas on the left calf left tibial area. Most of these are small and appear to have healthy granulation. He does not have anything open on the right. The edema and control in his thighs is pretty good which is usually a good indication he has been using his  pumps as requested. 2/18; he continues to have several small areas on the left calf and left tibial area. Most of these are small healthy granulation. We put him in his stocking on the right leg last week and he arrives with a superficial open area over the right upper tibia and a fairly large area on the right lateral tibia in similar condition. His edema control actually does not look too bad, he claims to be using his compression pumps twice a day 2/25. Continued small areas on the left calf and left tibial area. New areas especially on the right are identified just below the tibial tuberosity and on the right upper tibia itself. There are also areas of weeping edema fluid even without an obvious wound. He does not have a considerable degree of lymphedema but clearly there is more edema here than his skin can handle. He states he is using the pumps twice a day. We have an Unna boot on the right and 4 layer compression on the left. 3/3; he continues to have an area on the right lateral calf and right posterior calf just below the popliteal fossa. There is a fair amount of tenderness around the wound on the popliteal fossa but I did not see any evidence of cellulitis, could just be that the wrap came down and rubbed in this area. ooHe does not have an open area on the left leg however there is an area on the left dorsal foot at the base of the third toe ooWe have been using silver alginate to all wound areas 3/10; he did not have an open area on his left leg last time he was here a week ago. Today he arrives with a horizontal wound just below the tibial tuberosity and an area on the left lateral calf. He has intense erythema and tenderness in this area. The area is on the right lateral calf and right posterior calf better than last week. We have been using silver alginate as usual 3/18 - Patient returns with 3 small open areas on left calf, and 1 small open area on right calf, the skin looks  ok with no significant erythema, he continues the UNA boot on right and 4 layer compression on left. The right lateral calf wound is closed , the right posterior is small area. we will continue silver alginate to the areas. 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 He is been tolerating the dressing changes without complication. Fortunately she has made excellent improvement in regard to the overall status of his wounds. Unfortunately every time we cease wrapping him he ends up reopening in causing more significant issues at that point. Again I'm unsure of the best direction to take although I think the lymphedema clinic may be appropriate for him. 02/03/19 on evaluation today patient appears to be doing well in regard to the wounds that we saw him for last week unfortunately he has a new area on the proximal portion of his right medial/posterior lower extremity where the wrap somewhat slowed down and caused swelling and a blister to rub and open. Unfortunately this is the only opening that he has on either leg at this point. 02/17/19 on evaluation today patient's bilateral lower extremities appear to be doing well. He still completely healed in regard to the left lower extremity. In regard to the right lower extremity the area where the wrap and slid down and caused the blister still seems to be slightly open although this is dramatically better than during the last evaluation two weeks ago. I'm very pleased with the way this stands overall. 03/03/19 on evaluation today patient appears to be doing well in regard to his right lower extremity in general although he did have a new blister open this does not appear to be showing any evidence of active infection at this time. Fortunately there's No fevers,  chills, nausea, or vomiting noted at this time. Overall I feel like he is making good progress it does feel like that the right leg will we perform the [employer] seems to do with a bit better than three layer wrap on the left which slid down on him. We may switch to doing bilateral in the book wraps. 5/4; I have not seen [name] in quite some time. According to our case manager he did not have an open wound on his left leg last week. He had 1 remaining wound on the right posterior medial calf. He arrives today with multiple openings on the left leg probably were blisters and/or wrap injuries from Unna boots. I do not think the Unna boot's will provide adequate compression on the left. I am also not clear about the frequency he is using the compression pumps. 03/17/19 on evaluation today patient appears to be doing excellent in regard to his lower extremities compared to last week's evaluation apparently. He had gotten significantly worse last week which is unfortunate. The [employer] wrap on the left did not seem to do very well for him at all and in fact it didn't control his swelling significantly enough he had an additional outbreak. Subsequently we go back to the four layer compression wrap on the left. This is good news. At least in that he is doing better and the wound seem to be killing him. He still has not heard anything from the lymphedema clinic. 03/24/19 on evaluation today patient actually appears to be doing much better in regard to his bilateral lower Trinity as compared to last week when I saw him. Fortunately there's no signs of active infection at this time. He has been tolerating the dressing changes without complication. Overall I'm extremely pleased with the progress and appearance in general. 04/07/19 on evaluation today patient appears to be doing well in regard to his bilateral lower extremities. His swelling is significantly down from where it was previous.  With that being  said he does have a couple blisters still open at this point but fortunately nothing that seems to be too severe and again the majority of the larger openings has healed at this time. 04/14/19 on evaluation today patient actually appears to be doing quite well in regard to his bilateral lower extremities in fact I'm not even sure there's anything significantly open at this time at any site. Nonetheless he did have some trouble with these wraps where they are somewhat irritating him secondary to the fact that he has noted that the graph wasn't too close down to the end of this foot in a little bit short as well up to his knee. Otherwise things seem to be doing quite well. 04/21/19 upon evaluation today patient's wound bed actually showed evidence of being completely healed in regard to both lower extremities which is excellent news. There does not appear to be any signs of active infection which is also good news. I'm very pleased in this regard. No fevers, chills, nausea, or vomiting noted at this time. 04/28/19 on evaluation today patient appears to be doing a little bit worse in regard to both lower extremities on the left mainly due to the fact that when he went infection disease the wrap was not wrapped quite high enough he developed a blister above this. On the right he is a small open area of nothing too significant but again this is continuing to give him some trouble he has been were in the Velcro compression that he has at home. 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 He's been tolerating the dressing changes without complication. Fortunately there's no signs of active infection at this time. No fevers, chills, nausea, or vomiting noted at this time. We have been trying to get an appointment with her lymphedema clinic in [facility] but unfortunately nobody can get them on phone with not been able to even fax  information over the patient likewise is not been able to get in touch with them. Overall I'm not sure exactly what's going on here with to reach out again today. 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 Still having a lot of drainage unfortunately. He tells me especially in the left but even on the right. There's no signs of active infection which is good news we've been using so ratcheted up to this point. 05/19/19 on evaluation today patient actually appears to be doing quite well with regard to his left lower extremity which is great news. Fortunately in regard to the right lower extremity has an issues with his wrap and he subsequently did remove this from what I'm understanding. Nonetheless long story short is what he had rewrapped once he removed it subsequently had maggots underneath this wrap whenever he came in for evaluation today. With that being said they were obviously completely cleaned away by the nursing staff. The visit today which is excellent news. However he does appear to potentially have some infection around the right ankle region where the maggots were located as well. He will likely require anabiotic therapy today. 05/26/19 on evaluation today patient actually appears to be doing much better in regard to his bilateral lower extremities. I feel like the infection is under much better control. With that being said there were maggots noted when the wrap was removed yet again today.  Again this could have potentially been left over from previous although at this time there does not appear to be any signs of significant drainage there was obviously on the wrap some drainage as well this contracted gnats or otherwise. Either way I do not see anything that appears to be doing worse in my pinion and in fact I think his drainage has slowed down quite significantly likely mainly due to the fact to  his infection being under better control. 06/02/2019 on evaluation today patient actually appears to be doing well with regard to his bilateral lower extremities there is no signs of active infection at this time which is great news. With that being said he does have several open areas more so on the right than the left but nonetheless these are all significantly better than previously noted. 06/09/2019 on evaluation today patient actually appears to be doing well. His wrap stayed up and he did not cause any problems he had more drainage on the right compared to the left but overall I do not see any major issues at this time which is great news. 06/16/2019 on evaluation today patient appears to be doing excellent with regard to his lower extremities the only area that is open is a new blister that can have opened as of today on the medial ankle on the left. Other than this he really seems to be doing great I see no major issues at this point. 06/23/2019 on evaluation today patient appears to be doing quite well with regard to his bilateral lower extremities. In fact he actually appears to be almost completely healed there is a small area of weeping noted of the right lower extremity just above the ankle. Nonetheless fortunately there is no signs of active infection at this time which is good news. No fevers, chills, nausea, vomiting, or diarrhea. 8/24; the patient arrived for a nurse visit today but complained of very significant pain in the left leg and therefore I was asked to look at this. Noted that he did not have an open area on the left leg last week nevertheless this was wrapped. The patient states that he is not been able to put his compression pumps on the left leg because of the discomfort. He has not been systemically unwell 06/30/2019 on evaluation today patient unfortunately despite being excellent last week is doing much worse with regard to his left lower extremity today. In fact he had  to come in for a nurse on Monday where his left leg had to be rewrapped due to excessive weeping Dr. Leanord Hawking placed him on doxycycline at that point. Fortunately there is no signs of active infection Systemically at this time which is good news. 07/07/2019 in regard to the patient's wounds today he actually seems to be doing well with his right lower extremity there really is nothing open or draining at this point this is great news. Unfortunately the left lower extremity is given him additional trouble at this time. There does not appear to be any signs of active infection nonetheless he does have a lot of edema and swelling noted at this point as well as blistering all of which has led to a much more poor appearing leg at this time compared to where it was 2 weeks ago when it was almost completely healed. Obviously this is a little discouraging for the patient. He is try to contact the lymphedema clinic in [place] he has not been able to get through to them. 07/14/2019  on evaluation today patient actually appears to be doing slightly better with regard to his left lower extremity ulcers. Overall I do feel like at least at the top of the wrap that we have been placing this area has healed quite nicely and looks much better. The remainder of the leg is showing signs of improvement. Unfortunately in the thigh area he still has an open region on the left and again on the right he has been utilizing just a Band-Aid on an area that also opened on the thigh. Again this is an area that were not able to wrap although we did do an Ace wrap to provide some compression that something that obviously is a little less effective than the compression wraps we have been using on the lower portion of the leg. He does have an appointment with the lymphedema clinic in [facility] on Friday. 07/21/2019 on evaluation today patient appears to be doing better with regard to his lower extremity ulcers. He has been  tolerating the dressing changes without complication. Fortunately there is no signs of active infection at this time. No fevers, chills, nausea, vomiting, or diarrhea. I did receive the paperwork from the physical therapist at the lymphedema clinic in [place]. Subsequently I signed off on that this morning and sent that back to him for further progression with the treatment plan. 07/28/2019 on evaluation today patient appears to be doing very well with regard to his right lower extremity where I do not see any open wounds at this point. Fortunately he is feeling great as far as that is concerned as well. In regard to the left lower extremity he has been having issues with still several areas of weeping and edema although the upper leg is doing better his lower leg still I think is going require the compression wrap at this time. No fevers, chills, nausea, vomiting, or diarrhea. 08/04/2019 on evaluation today patient unfortunately is having new wounds on the right lower extremity. Again we have been using Unna boot wrap on that side. We switched him to using his juxta lite wrap at home. With that being said he tells me he has been using it although his legs extremely swollen and to be honest really does not appear that he has been. I cannot know that for sure however. Nonetheless he has multiple new wounds on the right lower extremity at this time. Obviously we will have to see about getting this rewrapped for him today. 08/11/2019 on evaluation today patient appears to be doing fairly well with regard to his wounds. He has been tolerating the dressing changes including the compression wraps without complication. He still has a lot of edema in his upper thigh regions bilaterally he is supposed to be seeing the lymphedema clinic on the 15th of this month once his wraps arrive for the upper part of his legs. 08/18/2019 on evaluation today patient appears to be doing well with regard to his  bilateral lower extremities at this point. He has been tolerating the dressing changes without complication. Fortunately there is no signs of active infection which is also good news. He does have a couple weeping areas on the first and second toe of the right foot he also has just a small area on the left foot upper leg and a small area on the left lower leg but overall he is doing quite well in my opinion. He is supposed to be getting his wraps shortly in fact tomorrow and then subsequently is seeing  the lymphedema clinic next Wednesday on the 21st. Of note he is also leaving on the 25th to go on vacation for a week to the beach. For that reason and since there is some uncertainty about what there can be doing at lymphedema clinic next Wednesday I am get a make an appointment for next Friday here for [place] to see what we need to do for him prior to him leaving for vacation. 10/23; patient arrives in considerable pain predominantly in the upper posterior calf just distal to the popliteal fossa also in the wound anteriorly above the major wound. This is probably cellulitis and he has had this recurrently in the past. He has no open wound on the right side and he has had an Radio broadcast assistant in that area. Finally I note that he has an area on the left posterior calf which by enlarge is mostly epithelialized. This protrudes beyond the borders of the surrounding skin in the setting of dry scaly skin and lymphedema. The patient is leaving for [facility] on Sunday. Per his longstanding pattern, he will not take his compression pumps with him predominantly out of fear that they will be stolen. He therefore asked that we put a Unna boot back on the right leg. He will also contact the wound care center in [facility] to see if they can change his dressing in the mid week. 11/3; patient returned from his vacation to [facility]. He was seen on 1 occasion at their wound care center. They did a 2 layer compression  system as they did not have our 4-layer wrap. I am not completely certain what they put on the wounds. They did not change the Unna boot on the right. The patient is also seeing a lymphedema specialist physical therapist in [place]. It appears that he has some compression sleeve for his thighs which indeed look quite a bit better than I am used to seeing. He pumps over these with his external compression pumps. Objective Constitutional Patient is hypertensive.. Pulse regular and within target range for patient.Marland Kitchen Respirations regular, non-labored and within target range.. Temperature is normal and within the target range for the patient.Marland Kitchen Appears in no distress. Vitals Time Taken: 3:15 PM, Height: 70 in, Weight: 380.2 lbs, BMI: 54.5, Temperature: 97.8 F, Pulse: 66 bpm, Respiratory Rate: 18 breaths/min, Blood Pressure: 194/57 mmHg. Eyes Conjunctivae clear. No discharge.no icterus. Respiratory work of breathing is normal. Cardiovascular Pedal pulses are palpable. Which better edema control. Much better edema control in both eyes as well. Lymphatic None palpable in the popliteal area bilaterally. Psychiatric appears at normal baseline. General Notes: Wound exam; right leg has no open area. He has a posterior left calf wound medial left ankle wound. Several small scattered areas. There is improvement in the amount of erythema. Still some tenderness where the wrap fell down but generally a lot better looking left lower extremity. Integumentary (Hair, Skin) Chronic venous inflammation in the left leg.. Wound #132R status is Open. Original cause of wound was Blister. The wound is located on the Left,Medial Upper Leg. The wound measures 0cm length x 0cm width x 0cm depth; 0cm^2 area and 0cm^3 volume. There is no tunneling or undermining noted. There is a none present amount of drainage noted. The wound margin is distinct with the outline attached to the wound base. There is no granulation  within the wound bed. There is no necrotic tissue within the wound bed. Wound #140 status is Open. Original cause of wound was Blister.  The wound is located on the Left,Posterior,Circumferential Lower Leg. The wound measures 5.2cm length x 5cm width x 0.1cm depth; 20.42cm^2 area and 2.042cm^3 volume. There is Fat Layer (Subcutaneous Tissue) Exposed exposed. There is no tunneling or undermining noted. There is a medium amount of serosanguineous drainage noted. Foul odor after cleansing was noted. The wound margin is distinct with the outline attached to the wound base. There is large (67- 100%) red, pink granulation within the wound bed. There is no necrotic tissue within the wound bed. Wound #141 status is Open. Original cause of wound was Blister. The wound is located on the Left,Medial Lower Leg. The wound measures 2.2cm length x 6cm width x 0.1cm depth; 10.367cm^2 area and 1.037cm^3 volume. There is Fat Layer (Subcutaneous Tissue) Exposed exposed. There is no tunneling or undermining noted. There is a large amount of serosanguineous drainage noted. The wound margin is flat and intact. There is large (67-100%) red granulation within the wound bed. There is no necrotic tissue within the wound bed. Wound #142 status is Open. Original cause of wound was Blister. The wound is located on the Left,Anterior Lower Leg. The wound measures 1.7cm length x 1.7cm width x 0.1cm depth; 2.27cm^2 area and 0.227cm^3 volume. There is Fat Layer (Subcutaneous Tissue) Exposed exposed. There is no tunneling or undermining noted. There is a medium amount of serosanguineous drainage noted. The wound margin is flat and intact. There is large (67-100%) red granulation within the wound bed. There is no necrotic tissue within the wound bed. Wound #143 status is Open. Original cause of wound was Gradually Appeared. The wound is located on the Left,Lateral Lower Leg. The wound measures 1.6cm length x 0.4cm width x 0.1cm  depth; 0.503cm^2 area and 0.05cm^3 volume. There is Fat Layer (Subcutaneous Tissue) Exposed exposed. There is no tunneling or undermining noted. There is a medium amount of serosanguineous drainage noted. The wound margin is flat and intact. There is large (67-100%) red granulation within the wound bed. There is no necrotic tissue within the wound bed. Wound #144 status is Open. Original cause of wound was Gradually Appeared. The wound is located on the Left Toe Fourth. The wound measures 0.5cm length x 0.6cm width x 0.1cm depth; 0.236cm^2 area and 0.024cm^3 volume. There is Fat Layer (Subcutaneous Tissue) Exposed exposed. There is no tunneling or undermining noted. There is a medium amount of serous drainage noted. The wound margin is flat and intact. There is no granulation within the wound bed. There is a large (67-100%) amount of necrotic tissue within the wound bed including Adherent Slough. 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 There was a Four Layer Compression Therapy Procedure by Kevin Pax, RN. Post procedure Diagnosis Wound #140: Same as Pre-Procedure Wound #141 Pre-procedure diagnosis of Wound #141 is a Lymphedema located on the Left,Medial Lower Leg . There was a Four Layer Compression Therapy Procedure by Kevin Pax, RN. Post procedure Diagnosis Wound #141: Same as Pre-Procedure Wound #142 Pre-procedure diagnosis of Wound #142 is a  Lymphedema located on the Left,Anterior Lower Leg . There was a  Four Layer Compression Therapy Procedure by Kevin Pax, RN. Post procedure Diagnosis Wound #142: Same as Pre-Procedure Wound #143 Pre-procedure diagnosis of Wound #143 is a Lymphedema located on the Left,Lateral Lower Leg . There was a Four Layer Compression Therapy Procedure by Kevin Pax, RN. Post procedure Diagnosis Wound #143: Same as Pre-Procedure Wound #144 Pre-procedure diagnosis of Wound #144 is a Lymphedema located on the Left Toe Fourth . There was a Four Layer Compression Therapy Procedure by Kevin Pax, RN. Post procedure Diagnosis Wound #144: Same as Pre-Procedure There was a Radio broadcast assistant Compression Therapy Procedure by Kevin Pax, RN. Post procedure Diagnosis Wound #: Same as Pre-Procedure Plan Follow-up Appointments: Return Appointment in 1 week. Dressing Change Frequency: Do not change entire dressing for one week. - both legs Skin Barriers/Peri-Wound Care: TCA Cream or Ointment - mix with lotion both legs Wound Cleansing: May shower with protection. Primary Wound Dressing: Wound #140 Left,Posterior,Circumferential Lower Leg: Calcium Alginate with Silver Wound #141 Left,Medial Lower Leg: Calcium Alginate with Silver Wound #142 Left,Anterior Lower Leg: Calcium Alginate with Silver Wound #143 Left,Lateral Lower Leg: Calcium Alginate with Silver Wound #144 Left Toe Fourth: Calcium Alginate with Silver Secondary Dressing: Wound #140 Left,Posterior,Circumferential Lower Leg: Dry Gauze ABD pad Wound #141 Left,Medial Lower Leg: Dry Gauze ABD pad Wound #142 Left,Anterior Lower Leg: Dry Gauze ABD pad Wound #143 Left,Lateral Lower Leg: Dry Gauze ABD pad Wound #144 Left Toe Fourth: Dry Gauze ABD pad Edema Control: 4 layer compression: Left lower extremity - unna boot at upper portion of lower leg. Unna Boot to Right Lower Extremity Avoid standing for long periods of time Elevate legs to the  level of the heart or above for 30 minutes daily and/or when sitting, a frequency of: Exercise regularly Segmental Compressive Device. - lymphadema pumps 60 min 2 times per day Additional Orders / Instructions: Other: - start oral antibiotics and complete all antibiotics. 1. Continue with silver alginate as the primary dressing on the open wounds on the left leg 2. We replaced his Unna boot on the right leg 3. He has some form of compression sleeve that he is using above the knees provided by his lymphedema therapist in [place]. 4. I am not certain what the plan is for the lower legs. He is healed on the right. He does have juxta lite stockings on the right but they never really maintained him. I wonder if there is an alternative plan. He apparently sees him in 2 days time in [place] [employer]) Signed: 09/07/2019 6:03:12 PM By: Baltazar Najjar MD Entered By: Baltazar Najjar on 09/07/2019 17:21:14 -------------------------------------------------------------------------------- SuperBill Details Patient Name: Date of Service: [name] 09/07/2019 Medical Record [id number] Patient Account Number: [id number] Date of Birth/Sex: Treating RN: Aug 15, 1951 (68 y.o. 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 I89.0 Lymphedema, not elsewhere classified E11.622 Type 2 diabetes mellitus with other skin ulcer 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, unspecified L03.116 Cellulitis of left lower limb Facility Procedures CPT4  Code Description: 91478295 (Facility Use Only) [id number] - APPLY MULTLAY COMPRS LWR LT LEG Modifier: Quantity: 1 CPT4 Code Description: 57846962 (Facility Use Only) 95284XL - APPLY UNNA BOOT RT Modifier: Quantity: 1  Physician Procedures CPT4: Description Modifier Quantity Code [id number] - WC PHYS LEVEL 3 - EST PT 1 ICD-10 Diagnosis Description L97.221 Non-pressure chronic ulcer of left calf limited to breakdown of skin I87.333 Chronic venous hypertension (idiopathic) with ulcer and  inflammation of bilateral lower extremity Electronic Signature(s) Signed: 09/07/2019 6:03:12 PM By: Baltazar Najjar MD Entered By: Baltazar Najjar on 09/07/2019 17:21:32

## 2019-10-13 NOTE — Progress Notes (Addendum)
[name], [name] ([id number]) Visit Report for 10/13/2019 Allergy List Details Patient Name: Date of Service: [name], [name] 10/13/2019 8:00 AM Medical Record [id number] Patient Account Number: [id number] Date of Birth/Sex: Treating RN: 08/30/1951 (68 y.o. Elizebeth KollerM) Lynch, Shatara Primary Care Velina Drollinger: Nicoletta BaMCGOWEN, [name] Other Clinician: Referring Athenia Rys: Treating Miachel Nardelli/Extender:[name], Briant CedarHoyt MCGOWEN, [name] in Treatment: 194 Allergies Active Allergies No Known Allergies Allergy Notes Electronic Signature(s) Signed: 10/13/2019 12:32:42 PM By: Lenda KelpStone III, Hoyt PA-C Entered By: Lenda KelpStone III, Hoyt on 10/13/2019 08:33:58 -------------------------------------------------------------------------------- Arrival Information Details Patient Name: Date of Service: [name], [name] 10/13/2019 8:00 AM Medical Record [id number] Patient Account Number: [id number] Date of Birth/Sex: Treating RN: 03/14/1951 (68 y.o. Judie PetitM) Yevonne PaxEpps, Carrie Primary Care Kristen Bushway: Nicoletta BaMCGOWEN, [name] Other Clinician: Referring Hitoshi Werts: Treating Jaionna Weisse/Extender:[name], Briant CedarHoyt MCGOWEN, [name] in Treatment: 194 Visit Information Patient Arrived: Ambulatory Arrival Time: 08:11 Accompanied By: self Transfer Assistance: None Patient Identification Verified: Yes Secondary Verification Process Yes Completed: Patient Requires Transmission-Based No Precautions: Patient Has Alerts: Yes Ha Pa History Since Last Visit All ordered tests and consults were completed: No Added or deleted any medications: No Any new allergies or adverse reactions: No Had a fall or experienced change in activities of daily living that may affect risk of falls: No Signs or symptoms of abuse/neglect since last visito No Hospitalized since last visit: No Implantable device outside of the clinic excluding No cellular tissue based products placed in the center since last visit: Has Dressing in Place as Prescribed: Yes s Compression  in Place as Prescribed: Yes Electronic Signature(s) Signed: 10/13/2019 12:07:41 PM By: Yevonne PaxEpps, Carrie RN Entered By: Yevonne PaxEpps, Carrie on 10/13/2019 08:12:25 -------------------------------------------------------------------------------- Compression Therapy Details Patient Name: Date of Service: [name], [name] 10/13/2019 8:00 AM Medical Record [id number] Patient Account Number: [id number] Date of Birth/Sex: Treating RN: 03/05/1951 (68 y.o. Elizebeth KollerM) Lynch, Shatara Primary Care Nasha Diss: Nicoletta BaMCGOWEN, [name] Other Clinician: Referring Shakeyla Giebler: Treating Shuna Tabor/Extender:[name], Briant CedarHoyt MCGOWEN, [name] in Treatment: 194 Compression Therapy Performed for Wound Wound #150 Left,Anterior Lower Leg Assessment: Performed By: Clinician Zandra AbtsLynch, Shatara, RN Compression Type: Four Layer Post Procedure Diagnosis Same as Pre-procedure Electronic Signature(s) Signed: 10/13/2019 4:59:21 PM By: Zandra AbtsLynch, Shatara RN, BSN Entered By: Zandra AbtsLynch, Shatara on 10/13/2019 08:44:12 -------------------------------------------------------------------------------- Lower Extremity Assessment Details Patient Name: Date of Service: [name], [name] 10/13/2019 8:00 AM Medical Record [id number] Patient Account Number: [id number] Date of Birth/Sex: Treating RN: 11/10/1950 (68 y.o. Judie PetitM) Yevonne PaxEpps, Carrie Primary Care Nomar Broad: Nicoletta BaMCGOWEN, [name] Other Clinician: Referring Bray Vickerman: Treating Luisangel Wainright/Extender:[name], Briant CedarHoyt MCGOWEN, [name] in Treatment: 194 Edema Assessment Assessed: [Left: No] [Right: No] Edema: [Left: Yes] [Right: Yes] Calf Left: Right: Point of Measurement: 31 cm From Medial Instep 41 cm 28 cm Ankle Left: Right: Point of Measurement: 12 cm From Medial Instep 25 cm 35.5 cm Vascular Assessment Pulses: Dorsalis Pedis Palpable: [Left:Yes] [Right:Yes] Electronic Signature(s) Signed: 10/13/2019 12:07:41 PM By: Yevonne PaxEpps, Carrie RN Entered By: Yevonne PaxEpps, Carrie on 10/13/2019  08:19:02 -------------------------------------------------------------------------------- Pain Assessment Details Patient Name: Date of Service: [name], [name] 10/13/2019 8:00 AM Medical Record [id number] Patient Account Number: [id number] Date of Birth/Sex: Treating RN: 04/22/1951 (68 y.o. Judie PetitM) Yevonne PaxEpps, Carrie Primary Care Pamila Mendibles: Nicoletta BaMCGOWEN, [name] Other Clinician: Referring Alliene Klugh: Treating Sumiye Hirth/Extender:[name], Briant CedarHoyt MCGOWEN, [name] in Treatment: 194 Active Problems Location of Pain Severity and Description of Pain Patient Has Paino No Site Locations Pain Management and Medication Current Pain Management: Electronic Signature(s) Signed: 10/13/2019 12:07:41 PM By: Yevonne PaxEpps, Carrie RN Entered By: Yevonne PaxEpps, Carrie on 10/13/2019 08:13:05 -------------------------------------------------------------------------------- Patient/Caregiver Education Details Patient Name: Date of Service: [name],  [name] 12/9/2020andnbsp8:00 AM Medical Record [id number] Patient Account Number: [id number] Date of Birth/Gender: Treating RN: 03-04-1951 (68 y.o. Katherina Right Primary Care Physician: Nicoletta Ba Other Clinician: Referring Physician: Treating Physician/Extender:[name], [name], [name] in Treatment: 194 Education Assessment Education Provided To: Patient Education Topics Provided Venous: Methods: Explain/Verbal Responses: State content correctly Wound/Skin Impairment: Methods: Explain/Verbal Responses: State content correctly Electronic Signature(s) Signed: 10/13/2019 12:04:56 PM By: Cherylin Mylar Entered By: Cherylin Mylar on 10/13/2019 08:50:38 -------------------------------------------------------------------------------- Wound Assessment Details Patient Name: Date of Service: [name] 10/13/2019 8:00 AM Medical Record [id number] Patient Account Number: [id number] Date of Birth/Sex: Treating RN: Jul 23, 1951 (68 y.o. Judie Petit)  Yevonne Pax Primary Care Kass Herberger: Nicoletta Ba Other Clinician: Referring [place] Oborn: Treating Jamarkus Lisbon/Extender:[name], [name], [name] in Treatment: 194 Wound Status Wound Number: [id number] Primary Lymphedema Etiology: Wound Location: Right Upper Leg - Medial Wound Open Wounding Event: Gradually Appeared Status: Date Acquired: 09/12/2019 Comorbid Chronic sinus problems/congestion, Weeks Of Treatment: 4 History: Arrhythmia, Hypertension, Peripheral Arterial Clustered Wound: No Disease, Type II Diabetes, History of Burn, Gout, Confinement Anxiety Photos Wound Measurements Length: (cm) 2.5 % Reductio Width: (cm) 0.8 % Reductio Depth: (cm) 0.1 Epithelial Area: (cm) 1.571 Tunneling Volume: (cm) 0.157 Undermini Wound Description Classification: Partial Thickness Wound Margin: Flat and Intact Exudate Amount: Medium Exudate Type: Serous Exudate Color: amber Wound Bed Granulation Amount: Large (67-100%) Necrotic Amount: Small (1-33%) Necrotic Quality: Adherent Slough Foul Odor After Cleansing: No Slough/Fibrino Yes Exposed Structure Fascia Exposed: No Fat Layer (Subcutaneous Tissue) Exposed: Yes Tendon Exposed: No Muscle Exposed: No Joint Exposed: No Bone Exposed: No n in Area: -33.4% n in Volume: -33.1% ization: Small (1-33%) : No ng: No Electronic Signature(s) Signed: 10/14/2019 3:39:48 PM By: Benjaman Kindler EMT/HBOT Signed: 10/14/2019 4:44:46 PM By: Yevonne Pax RN Previous Signature: 10/13/2019 12:07:41 PM Version By: Yevonne Pax RN Entered By: Benjaman Kindler on 10/14/2019 13:49:41 -------------------------------------------------------------------------------- Wound Assessment Details Patient Name: Date of Service: [name] 10/13/2019 8:00 AM Medical Record [id number] Patient Account Number: [id number] Date of Birth/Sex: Treating RN: 1951-07-07 (68 y.o. Judie Petit) Yevonne Pax Primary Care Adi Seales: Nicoletta Ba Other  Clinician: Referring [name] Gatton: Treating Simrin Vegh/Extender:[name], [name], [name] in Treatment: 194 Wound Status Wound Number: 146 Primary Lymphedema Etiology: Wound Location: Left Toe Second Secondary Diabetic Wound/Ulcer of the Lower Wounding Event: Gradually Appeared Wounding Event: Gradually Appeared Etiology: Extremity Date Acquired: 09/14/2019 Wound Healed - Epithelialized Weeks Of Treatment: 4 Status: Clustered Wound: No Comorbid Chronic sinus problems/congestion, History: Arrhythmia, Hypertension, Peripheral Arterial Disease, Type II Diabetes, History of Burn, Gout, Confinement Anxiety Photos Wound Measurements Length: (cm) 0 % Reduct Width: (cm) 0 % Reduct Depth: (cm) 0 Epitheli Area: (cm) 0 Tunneli Volume: (cm) 0 Undermi Wound Description Classification: Full Thickness Without Exposed Support Foul Od Structures Slough/ Wound Flat and Intact Margin: Exudate None Present Amount: Wound Bed Granulation Amount: None Present (0%) Necrotic Amount: None Present (0%) Fascia Fat Lay Tendon Muscle Joint E Bone Ex or After Cleansing: No Fibrino No Exposed Structure Exposed: No er (Subcutaneous Tissue) Exposed: No Exposed: No Exposed: No xposed: No posed: No ion in Area: 100% ion in Volume: 100% alization: Large (67-100%) ng: No ning: No Electronic Signature(s) Signed: 10/14/2019 3:39:48 PM By: Benjaman Kindler EMT/HBOT Signed: 10/14/2019 4:44:46 PM By: Yevonne Pax RN Previous Signature: 10/13/2019 12:07:41 PM Version By: Yevonne Pax RN Entered By: Benjaman Kindler on 10/14/2019 13:48:07 -------------------------------------------------------------------------------- Wound Assessment Details Patient Name: Date of Service: [name]. 10/13/2019 8:00 AM Medical Record [id number] Patient Account Number:  [id number] Date of Birth/Sex: Treating RN: 10/25/1951 (68 y.o. Judie Petit) Yevonne Pax Primary Care Trayvond Viets: Nicoletta Ba Other  Clinician: Referring Miette Molenda: Treating Camillia Marcy/Extender:[name], [name], [name] in Treatment: 194 Wound Status Wound Number: 147 Primary Lymphedema Etiology: Wound Location: Left Lower Leg - Circumferential Wound Open Wounding Event: Blister Status: Date Acquired: 09/30/2019 Comorbid Chronic sinus problems/congestion, Weeks Of Treatment: 1 History: Arrhythmia, Hypertension, Peripheral Arterial Clustered Wound: No Disease, Type II Diabetes, History of Burn, Gout, Confinement Anxiety Photos Wound Measurements Length: (cm) 1.5 % Reduct Width: (cm) 8.5 % Reduct Depth: (cm) 0.1 Epitheli Area: (cm) 10.014 Tunneli Volume: (cm) 1.001 Undermi Wound Description Full Thickness Without Exposed Support Foul Od Classification: Structures Slough/ Wound Distinct, outline attached Margin: Exudate Large Amount: Exudate Serous Type: Exudate amber Color: Wound Bed Granulation Amount: Large (67-100%) Granulation Quality: Red, Pink Fascia Necrotic Amount: Small (1-33%) Fat Lay Necrotic Quality: Adherent Slough Tendon Muscle Joint E Bone Ex or After Cleansing: No Fibrino Yes Exposed Structure Exposed: No er (Subcutaneous Tissue) Exposed: Yes Exposed: No Exposed: No xposed: No posed: No ion in Area: 96% ion in Volume: 96% alization: Small (1-33%) ng: No ning: No Electronic Signature(s) Signed: 10/14/2019 3:39:48 PM By: Benjaman Kindler EMT/HBOT Signed: 10/14/2019 4:44:46 PM By: Yevonne Pax RN Previous Signature: 10/13/2019 12:07:41 PM Version By: Yevonne Pax RN Previous Signature: 10/13/2019 12:07:41 PM Version By: Yevonne Pax RN Entered By: Benjaman Kindler on 10/14/2019 13:50:25 -------------------------------------------------------------------------------- Wound Assessment Details Patient Name: Date of Service: [name] 10/13/2019 8:00 AM Medical Record [id number] Patient Account Number: [id number] Date of Birth/Sex: Treating  RN: February 07, 1951 (68 y.o. Judie Petit) Yevonne Pax Primary Care Kveon Casanas: Nicoletta Ba Other Clinician: Referring Lashanda Storlie: Treating Chelan Heringer/Extender:[name], [name], [name] in Treatment: 194 Wound Status Wound Number: 148 Primary Lymphedema Etiology: Wound Location: Right Toe Second Wound Healed - Epithelialized Wounding Event: Blister Status: Date Acquired: 10/04/2019 Comorbid Chronic sinus problems/congestion, Weeks Of Treatment: 1 History: Arrhythmia, Hypertension, Peripheral Arterial Clustered Wound: No Disease, Type II Diabetes, History of Burn, Gout, Confinement Anxiety Photos Wound Measurements Length: (cm) 0 % Reduct Width: (cm) 0 % Reduct Depth: (cm) 0 Epitheli Area: (cm) 0 Tunneli Volume: (cm) 0 Undermi Wound Description Full Thickness Without Exposed Support Classification: Structures Wound Flat and Intact Margin: Exudate None Present Amount: Wound Bed Granulation Amount: None Present (0%) Necrotic Amount: None Present (0%) Foul Odor After Cleansing: No Slough/Fibrino No Exposed Structure Fascia Exposed: No Fat Layer (Subcutaneous Tissue) Exposed: No Tendon Exposed: No Muscle Exposed: No Joint Exposed: No Bone Exposed: No ion in Area: 100% ion in Volume: 100% alization: Large (67-100%) ng: No ning: No Electronic Signature(s) Signed: 10/14/2019 3:39:48 PM By: Benjaman Kindler EMT/HBOT Signed: 10/14/2019 4:44:46 PM By: Yevonne Pax RN Previous Signature: 10/13/2019 12:07:41 PM Version By: Yevonne Pax RN Entered By: Benjaman Kindler on 10/14/2019 13:49:07 -------------------------------------------------------------------------------- Wound Assessment Details Patient Name: Date of Service: [name] 10/13/2019 8:00 AM Medical Record [id number] Patient Account Number: [id number] Date of Birth/Sex: Treating RN: [birth year] (68 y.o. Judie Petit) Yevonne Pax Primary Care Khiara Shuping: Nicoletta Ba Other Clinician: Referring [place] Alfredo: Treating  Belen Pesch/Extender:[name], [name], [name] in Treatment: 194 Wound Status Wound Number: 149 Primary Lymphedema Etiology: Wound Location: Left Upper Leg - Medial Wound Open Wounding Event: Blister Status: Date Acquired: 10/04/2019 Comorbid Chronic sinus problems/congestion, Weeks Of Treatment: 1 History: Arrhythmia, Hypertension, Peripheral Arterial Clustered Wound: No Disease, Type II Diabetes, History of Burn, Gout, Confinement Anxiety Photos Wound Measurements Length: (cm) 7 % Reduct Width: (cm) 9.5 % Reduct Depth: (cm) 0.1  [place] Area: (cm) 52.229 Tunneli Volume: (cm) 5.223 Undermi Wound Description Full Thickness Without Exposed Support Foul Od Classification: Structures Slough/ Wound Flat and Intact Margin: Exudate Large Amount: Exudate Serous Type: Exudate amber Color: Wound Bed Granulation Amount: Medium (34-66%) Granulation Quality: Red Fascia Exp Necrotic Amount: Medium (34-66%) Fat Layer Necrotic Quality: Adherent Slough Tendon Exp Muscle Exp Joint Expo Bone Expos or After Cleansing: No Fibrino Yes Exposed Structure osed: No (Subcutaneous Tissue) Exposed: Yes osed: No osed: No sed: No ed: No ion in Area: -47.8% ion in Volume: -47.8% alization: None ng: No ning: No Electronic Signature(s) Signed: 10/14/2019 3:39:48 PM By: Mikeal Hawthorne EMT/HBOT Signed: 10/14/2019 4:44:46 PM By: [name] RN Previous Signature: 10/13/2019 12:07:41 PM Version By: [name] RN Entered By: Mikeal Hawthorne on 10/14/2019 13:52:05 -------------------------------------------------------------------------------- Wound Assessment Details Patient Name: Date of Service: [name] 10/13/2019 8:00 AM Medical Record [id number] Patient Account Number: [id number] Date of Birth/Sex: Treating RN: 1950-11-11 (68 y.o. [name]) [name] Primary Care Katrenia Alkins: [name] Other Clinician: Referring Jesaiah Fabiano: Treating Janet Humphreys/Extender:Stone  III, Dema Severin, [name] in Treatment: 194 Wound Status Wound Number: 150 Primary Lymphedema Etiology: Wound Location: Left Lower Leg - Anterior Wound Open Wounding Event: Blister Status: Date Acquired: 10/13/2019 Comorbid Chronic sinus problems/congestion, Weeks Of Treatment: 0 History: Arrhythmia, Hypertension, Peripheral Arterial Clustered Wound: No Disease, Type II Diabetes, History of Burn, Gout, Confinement Anxiety Photos Wound Measurements Length: (cm) 2 % Reductio Width: (cm) 1.5 % Reduct Depth: (cm) 0.1 Epitheli Area: (cm) 2.356 Tunneli Volume: (cm) 0.236 Undermi Wound Description Full Thickness Without Exposed Support Foul Od Classification: Structures Slough/ Wound Distinct, outline attached Margin: Exudate Medium Amount: Exudate Serous Type: Exudate amber Color: Wound Bed Granulation Amount: Large (67-100%) Granulation Quality: Pink Fascia Necrotic Amount: Small (1-33%) Fat Lay Necrotic Quality: Adherent Slough Tendon Muscle Joint E Bone Ex or After Cleansing: No Fibrino Yes Exposed Structure Exposed: No er (Subcutaneous Tissue) Exposed: Yes Exposed: No Exposed: No xposed: No posed: No n in Area: 0% ion in Volume: 0% alization: None ng: No ning: No Electronic Signature(s) Signed: 10/14/2019 3:39:48 PM By: Mikeal Hawthorne EMT/HBOT Signed: 10/14/2019 4:44:46 PM By: [name] RN Previous Signature: 10/13/2019 12:07:41 PM Version By: [name] RN Entered By: Mikeal Hawthorne on 10/14/2019 13:53:42 -------------------------------------------------------------------------------- Vitals Details Patient Name: Date of Service: [name]. 10/13/2019 8:00 AM Medical Record [id number] Patient Account Number: [id number] Date of Birth/Sex: Treating RN: 15-Jul-1951 (68 y.o. [name]) Dolores Lory, [place] Primary Care Cleona Doubleday: [name] Other Clinician: Referring Elim Peale: Treating Bradley Handyside/Extender:[name], Dema Severin,  [name] in Treatment: 194 Vital Signs Time Taken: 08:12 Temperature (F): 97.9 Height (in): 70 Pulse (bpm): 61 Weight (lbs): 380.2 Respiratory Rate (breaths/min): 18 Body Mass Index (BMI): 54.5 Blood Pressure (mmHg): 144/53 Reference Range: 80 - 120 mg / dl Electronic Signature(s) Signed: 10/13/2019 12:07:41 PM By: [name] RN Entered By: [name] on 10/13/2019 08:12:57

## 2019-10-13 NOTE — Progress Notes (Signed)
[name], [name] ([id number]) Visit Report for 09/07/2019 Arrival Information Details Patient Name: Date of Service: [name], [name] 09/07/2019 2:30 PM Medical Record [id number] Patient Account Number: [id number] Date of Birth/Sex: Treating RN: 10-23-1951 (68 y.o. [name]) [name] Primary Care Broedy Osbourne: [name] Other Clinician: Referring Latisia Hilaire: Treating Zuri Bradway/Extender:Robson, Peyton Najjar, [name] in Treatment: 189 Visit Information History Since Last Visit Walker Added or deleted any medications: No Patient Arrived: Any new allergies or adverse reactions: No Arrival Time: 15:15 Had a fall or experienced change in No Accompanied By: self activities of daily living that may affect Transfer Assistance: None risk of falls: Patient Identification Verified: Yes Signs or symptoms of abuse/neglect since last No Secondary Verification Process Completed: Yes visito Patient Requires Transmission-Based No Hospitalized since last visit: No Precautions: Implantable device outside of the clinic excluding No Patient Has Alerts: Yes cellular tissue based products placed in the center since last visit: Has Dressing in Place as Prescribed: Yes Pain Present Now: Yes Electronic Signature(s) Signed: 10/12/2019 3:00:22 PM By: Sandre Kitty Entered By: Sandre Kitty on 09/07/2019 15:15:54 -------------------------------------------------------------------------------- Compression Therapy Details Patient Name: Date of Service: [name] 09/07/2019 2:30 PM Medical Record [id number] Patient Account Number: [id number] Date of Birth/Sex: Treating RN: Jun 14, 1951 (68 y.o. [name]) [name] Primary Care Cire Deyarmin: [name] Other Clinician: Referring Lerlene Treadwell: Treating Santita Hunsberger/Extender:Robson, Peyton Najjar, [name] in Treatment: 189 Compression Therapy Performed for Wound Wound #140 Left,Posterior,Circumferential Lower  Leg Assessment: Performed By: Jake Church, RN Compression Type: Four Layer Post Procedure Diagnosis Same as Pre-procedure Electronic Signature(s) Signed: 10/13/2019 12:05:18 PM By: [name] RN Entered By: [name] on 09/07/2019 16:53:45 -------------------------------------------------------------------------------- Compression Therapy Details Patient Name: Date of Service: [name], [name] 09/07/2019 2:30 PM Medical Record [id number] Patient Account Number: [id number] Date of Birth/Sex: Treating RN: 10-Apr-1951 (68 y.o. [name]) [name] Primary Care Denasia Venn: [name] Other Clinician: Referring Letrell Attwood: Treating Teah Votaw/Extender:Robson, Peyton Najjar, [name] in Treatment: 189 Compression Therapy Performed for Wound Wound #141 Left,Medial Lower Leg Assessment: Performed By: Jake Church, RN Compression Type: Four Layer Post Procedure Diagnosis Same as Pre-procedure Electronic Signature(s) Signed: 10/13/2019 12:05:18 PM By: [name] RN Entered By: [name] on 09/07/2019 16:53:46 -------------------------------------------------------------------------------- Compression Therapy Details Patient Name: Date of Service: [name], [name] 09/07/2019 2:30 PM Medical Record [id number] Patient Account Number: [id number] Date of Birth/Sex: Treating RN: Feb 05, 1951 (68 y.o. [name]) [name] Primary Care Pearlene Teat: [name] Other Clinician: Referring Gracen Ringwald: Treating Natavia Sublette/Extender:Robson, Peyton Najjar, [name] in Treatment: 189 Compression Therapy Performed for Wound Wound #142 Left,Anterior Lower Leg Assessment: Performed By: Jake Church, RN Compression Type: Four Layer Post Procedure Diagnosis Same as Pre-procedure Electronic Signature(s) Signed: 10/13/2019 12:05:18 PM By: [name] RN Entered By: [name] on 09/07/2019  16:53:46 -------------------------------------------------------------------------------- Compression Therapy Details Patient Name: Date of Service: [name], [name] 09/07/2019 2:30 PM Medical Record [id number] Patient Account Number: [id number] Date of Birth/Sex: Treating RN: [birth year] (68 y.o. [name]) [name] Primary Care Jasean Ambrosia: [name] Other Clinician: Referring Evlyn Amason: Treating Niyana Chesbro/Extender:Robson, Peyton Najjar, [name] in Treatment: 189 Compression Therapy Performed for Wound Wound #143 Left,Lateral Lower Leg Assessment: Performed By: Jake Church, RN Compression Type: Four Layer Post Procedure Diagnosis Same as Pre-procedure Electronic Signature(s) Signed: 10/13/2019 12:05:18 PM By: [name] RN Entered By: [name] on 09/07/2019 16:53:46 -------------------------------------------------------------------------------- Compression Therapy Details Patient Name: Date of Service: [name], [name] 09/07/2019 2:30 PM Medical Record [id number] Patient Account Number: [id number] Date of Birth/Sex: Treating RN: Apr 14, 1951 (68 y.o. [name]) [place], [place]  Primary Care Kimberely Mccannon: [name] Other Clinician: Referring Vercie Pokorny: Treating Yatzary Merriweather/Extender:Robson, Peyton Najjar, [name] in Treatment: 189 Compression Therapy Performed for Wound Wound #144 Left Toe Fourth Assessment: Performed By: Clinician [name], RN Compression Type: Four Layer Post Procedure Diagnosis Same as Pre-procedure Electronic Signature(s) Signed: 10/13/2019 12:05:18 PM By: [name] RN Entered By: [name] on 09/07/2019 16:53:46 -------------------------------------------------------------------------------- Compression Therapy Details Patient Name: Date of Service: [name], [name] 09/07/2019 2:30 PM Medical Record [id number] Patient Account Number: [id number] Date of Birth/Sex: Treating RN: 1951-07-16 (68 y.o. [name]) [name] Primary Care Lequan Dobratz: [name] Other Clinician: Referring Leanah Kolander: Treating Rajvir Ernster/Extender:Robson, Peyton Najjar, [name] in Treatment: 189 Compression Therapy Performed for Wound NonWound Condition Lymphedema - Right Leg Assessment: Performed By: Jake Church, RN Compression Type: [name] Post Procedure Diagnosis Same as Pre-procedure Electronic Signature(s) Signed: 10/13/2019 12:05:18 PM By: [name] RN Entered By: [name] on 09/07/2019 16:54:05 -------------------------------------------------------------------------------- Encounter Discharge Information Details Patient Name: Date of Service: [name] 09/07/2019 2:30 PM Medical Record [id number] Patient Account Number: [id number] Date of Birth/Sex: Treating RN: 1951/09/27 (68 y.o. [name] Primary Care Camiyah Friberg: [name] Other Clinician: Referring Adisyn Ruscitti: Treating Marijose Curington/Extender:Robson, Peyton Najjar, [name] in Treatment: 189 Encounter Discharge Information Items Discharge Condition: Stable Ambulatory Status: Walker Discharge Destination: Home Transportation: Private Auto Accompanied By: self Schedule Follow-up Appointment: Yes Clinical Summary of Care: Patient Declined Electronic Signature(s) Signed: 09/09/2019 5:16:49 PM By: Kela Millin Entered By: Kela Millin on 09/07/2019 17:45:46 -------------------------------------------------------------------------------- Lower Extremity Assessment Details Patient Name: Date of Service: [name], [name] 09/07/2019 2:30 PM Medical Record [id number] Patient Account Number: [id number] Date of Birth/Sex: Treating RN: 1951-02-05 (68 y.o. [name] Primary Care Derisha Funderburke: [name] Other Clinician: Referring Mulki Roesler: Treating Stanely Sexson/Extender:Robson, Peyton Najjar, [name] in Treatment: 189 Edema Assessment Assessed: [Left: No] [Right: No] Edema:  [Left: Yes] [Right: Yes] Calf Left: Right: Point of Measurement: 31 cm From Medial Instep 40.8 cm 41.2 cm Ankle Left: Right: Point of Measurement: 12 cm From Medial Instep 27.5 cm 28 cm Electronic Signature(s) Signed: 09/07/2019 6:11:25 PM By: Baruch Gouty RN, BSN Entered By: Baruch Gouty on 09/07/2019 16:09:45 -------------------------------------------------------------------------------- Multi Wound Chart Details Patient Name: Date of Service: [name] 09/07/2019 2:30 PM Medical Record [id number] Patient Account Number: [id number] Date of Birth/Sex: Treating RN: [birth year] (68 y.o. Oval Linsey Primary Care Nena Hampe: [name] Other Clinician: Referring Ethanael Veith: Treating Brandyn Lowrey/Extender:Robson, Peyton Najjar, [name] in Treatment: 189 Vital Signs Height(in): 70 Pulse(bpm): 66 Weight(lbs): 380.2 Blood Pressure(mmHg): 194/57 Body Mass Index(BMI): 55 Temperature(F): 97.8 Respiratory 18 Rate(breaths/min): Photos: [132R:No Photos] [140:No Photos] [141:No Photos] Wound Location: [132R:Left Upper Leg - Medial] [140:Left Lower Leg - Posterior, Left Lower Leg - Medial Circumferential] Wounding Event: [132R:Blister] [140:Blister] [141:Blister] Primary Etiology: [132R:Lymphedema] [140:Lymphedema] [141:Lymphedema] Comorbid History: [132R:Chronic sinus problems/congestion, Arrhythmia, Hypertension, Arrhythmia, Hypertension, Arrhythmia, Hypertension, Peripheral Arterial Disease, Peripheral Arterial Disease, Peripheral Arterial Disease, Type II Diabetes, History of  Type II Diabetes, History of Type II Diabetes, History of Burn, Gout, Confinement Burn, Gout, Confinement Burn, Gout, Confinement Anxiety] [140:Chronic sinus problems/congestion, Anxiety] [141:Chronic sinus problems/congestion, Anxiety] Date Acquired: [132R:07/14/2019] [140:08/27/2019] [141:09/07/2019] Weeks of Treatment: [132R:7] [140:1] [141:0] Wound Status: [132R:Open] [140:Open]  [141:Open] Wound Recurrence: [132R:Yes] [140:No] [141:No] Measurements L x W x D 0x0x0 [140:5.2x5x0.1] [141:2.2x6x0.1] (cm) Area (cm) : [132R:0] [140:20.42] [141:10.367] Volume (cm) : [132R:0] [140:2.042] [141:1.037] % Reduction in Area: [132R:100.00%] [140:94.50%] [141:0.00%] % Reduction in Volume: 100.00% [140:94.50%] [141:0.00%] Classification: [132R:Full Thickness Without Exposed Support Structures Exposed Support Structures Exposed Support  Structures] [140:Full Thickness Without] [141:Full Thickness Without] Exudate Amount: [132R:None Present] [140:Medium] [141:Large] Exudate Type: [132R:N/A] [140:Serosanguineous] [141:Serosanguineous] Exudate Color: [132R:N/A] [140:red, brown] [141:red, brown] Foul Odor After Cleansing:No [140:Yes] [141:No] Odor Anticipated Due to N/A [140:No] [141:N/A] Product Use: Wound Margin: [132R:Distinct, outline attached Distinct, outline attached Flat and Intact] Granulation Amount: [132R:None Present (0%)] [140:Large (67-100%)] [141:Large (67-100%)] Granulation Quality: [132R:N/A] [140:Red, Pink] [141:Red] Necrotic Amount: [132R:None Present (0%)] [140:None Present (0%)] [141:None Present (0%)] Exposed Structures: [132R:Fascia: No Fat Layer (Subcutaneous Tissue) Exposed: Yes Tissue) Exposed: No Tendon: No Muscle: No Joint: No Bone: No] [140:Fat Layer (Subcutaneous Fat Layer (Subcutaneous Fascia: No Tendon: No Muscle: No Joint: No Bone: No] [141:Tissue) Exposed:  Yes Fascia: No Tendon: No Muscle: No Joint: No Bone: No] Epithelialization: [132R:Large (67-100%)] [140:Medium (34-66%)] [141:Small (1-33%)] Procedures Performed: N/A [132R:142] [140:Compression Therapy 143] [141:Compression Therapy] Photos: [132R:No Photos] [140:No Photos] [141:No Photos] Wound Location: [132R:Left Lower Leg - Anterior] [140:Left Lower Leg - Lateral] [141:Left Toe Fourth] Wounding Event: [132R:Blister] [140:Gradually Appeared] [141:Gradually Appeared] Primary Etiology:  [132R:Lymphedema] [140:Lymphedema] [141:Lymphedema] Comorbid History: [132R:Chronic sinus problems/congestion, Arrhythmia, Hypertension, Peripheral Arterial Disease, Type II Diabetes, History of Burn, Gout, Confinement Anxiety] [140:Chronic sinus problems/congestion, Arrhythmia, Hypertension, Peripheral  Arterial Disease, Type II Diabetes, History of Burn, Gout, Confinement Anxiety] [141:Chronic sinus problems/congestion, Arrhythmia, Hypertension, Peripheral Arterial Disease, Type II Diabetes, History of Burn, Gout, Confinement Anxiety] Date Acquired: [132R:09/07/2019] [140:09/07/2019] [141:09/07/2019] Weeks of Treatment: [132R:0] [140:0] [141:0] Wound Status: [132R:Open] [140:Open] [141:Open] Wound Recurrence: [132R:No] [140:No] [141:No] Measurements L x W x D 1.7x1.7x0.1 [140:1.6x0.4x0.1] [141:0.5x0.6x0.1] (cm) Area (cm) : [132R:2.27] [140:0.503] [141:0.236] Volume (cm) : [132R:0.227] [140:0.05] [141:0.024] % Reduction in Area: [132R:0.00%] [140:0.00%] [141:0.00%] % Reduction in Volume: [132R:0.00%] [140:0.00%] [141:0.00%] Classification: [132R:Full Thickness Without Exposed Support Structures Exposed Support Structures Exposed Support Structures] [140:Full Thickness Without] [141:Full Thickness Without] Exudate Amount: [132R:Medium] [140:Medium] [141:Medium] Exudate Type: [132R:Serosanguineous] [140:Serosanguineous] [141:Serous] Exudate Color: [132R:red, brown] [140:red, brown] [141:amber] Foul Odor After Cleansing:No [140:No] [141:No] Odor Anticipated Due to N/A [140:N/A] [141:N/A] Product Use: Wound Margin: [132R:Flat and Intact] [140:Flat and Intact] [141:Flat and Intact] Granulation Amount: [132R:Large (67-100%)] [140:Large (67-100%)] [141:None Present (0%)] Granulation Quality: [132R:Red] [140:Red] [141:N/A] Necrotic Amount: [132R:None Present (0%)] [140:None Present (0%)] [141:Large (67-100%)] Exposed Structures: [132R:Fat Layer (Subcutaneous Fat Layer (Subcutaneous Fat Layer  (Subcutaneous Tissue) Exposed: Yes Fascia: No Tendon: No Muscle: No Joint: No Bone: No] [140:Tissue) Exposed: Yes Fascia: No Tendon: No Muscle: No Joint: No Bone: No] [141:Tissue) Exposed:  Yes Fascia: No Tendon: No Muscle: No Joint: No Bone: No] Epithelialization: [132R:Medium (34-66%)] [140:Small (1-33%) Compression Therapy] [141:None Compression Therapy] Treatment Notes Electronic Signature(s) Signed: 09/07/2019 6:03:12 PM By: Linton Ham MD Signed: 10/13/2019 12:05:18 PM By: [name] RN Entered By: Linton Ham on 09/07/2019 17:13:03 -------------------------------------------------------------------------------- Multi-Disciplinary Care Plan Details Patient Name: Date of Service: [name] 09/07/2019 2:30 PM Medical Record [id number] Patient Account Number: [id number] Date of Birth/Sex: Treating RN: January 13, 1951 (68 y.o. [name]) [name] Primary Care Kabrea Seeney: [name] Other Clinician: Referring Shekelia Boutin: Treating Elishua Radford/Extender:Robson, Peyton Najjar, [name] in Treatment: 189 Active Inactive Venous Leg Ulcer Nursing Diagnoses: Actual venous Insuffiency (use after diagnosis is confirmed) Goals: Patient will maintain optimal edema control Date Initiated: 09/10/2016 Target Resolution Date: 10/01/2019 Goal Status: Active Verify adequate tissue perfusion prior to therapeutic compression application Date Initiated: 09/10/2016 Date Inactivated: 11/28/2016 Goal Status: Met Interventions: Assess peripheral edema status every visit. Compression as ordered Provide education on venous insufficiency Notes: edema not contolled above wraps, pt not using lymoh pumps regularly Wound/Skin Impairment Nursing Diagnoses: Impaired tissue  integrity Goals: Patient/caregiver will verbalize understanding of skin care regimen Date Initiated: 09/10/2016 Target Resolution Date: 10/01/2019 Goal Status: Active Interventions: Assess patient/caregiver ability to  perform ulcer/skin care regimen upon admission and as needed Assess ulceration(s) every visit Provide education on ulcer and skin care Notes: Electronic Signature(s) Signed: 10/13/2019 12:05:18 PM By: [name] RN Entered By: [name] on 09/07/2019 15:28:33 -------------------------------------------------------------------------------- Pain Assessment Details Patient Name: Date of Service: [name], [name] 09/07/2019 2:30 PM Medical Record [id number] Patient Account Number: [id number] Date of Birth/Sex: Treating RN: 07-29-1951 (68 y.o. [name]) [name] Primary Care Kendric Sindelar: [name] Other Clinician: Referring Linda Biehn: Treating Baily Hovanec/Extender:Robson, Peyton Najjar, [name] in Treatment: 189 Active Problems Location of Pain Severity and Description of Pain Patient Has Paino No Site Locations Pain Management and Medication Current Pain Management: Electronic Signature(s) Signed: 10/12/2019 3:00:22 PM By: Sandre Kitty Signed: 10/13/2019 12:05:18 PM By: [name] RN Entered By: Sandre Kitty on 09/07/2019 15:19:29 -------------------------------------------------------------------------------- Patient/Caregiver Education Details Patient Name: Date of Service: [name] 11/3/2020andnbsp2:30 PM Medical Record [id number] Patient Account Number: [id number] Date of Birth/Gender: Treating RN: 09-May-1951 (68 y.o. Oval Linsey Primary Care Physician: [name] Other Clinician: Referring Physician: Treating Physician/Extender:Robson, Peyton Najjar, [name] in Treatment: 189 Education Assessment Education Provided To: Patient Education Topics Provided Venous: Methods: Explain/Verbal Responses: State content correctly Electronic Signature(s) Signed: 10/13/2019 12:05:18 PM By: [name] RN Entered By: [name] on 09/07/2019  15:29:01 -------------------------------------------------------------------------------- Wound Assessment Details Patient Name: Date of Service: [name], [name] 09/07/2019 2:30 PM Medical Record [id number] Patient Account Number: [id number] Date of Birth/Sex: Treating RN: 08-11-1951 (68 y.o. [name] Primary Care Jaquanda Wickersham: [name] Other Clinician: Referring Beaux Verne: Treating Tyller Bowlby/Extender:Robson, Peyton Najjar, [name] in Treatment: 189 Wound Status Wound Number: [id number] Primary Lymphedema Etiology: Wound Location: Left Upper Leg - Medial Wound Healed - Epithelialized Wounding Event: Blister Status: Date Acquired: 07/14/2019 Comorbid Chronic sinus problems/congestion, Weeks Of Treatment: 7 History: Arrhythmia, Hypertension, Peripheral Arterial Clustered Wound: No Disease, Type II Diabetes, History of Burn, Gout, Confinement Anxiety Photos Wound Measurements Length: (cm) 0 % Reduct Width: (cm) 0 % Reduct Depth: (cm) 0 Epitheli Area: (cm) 0 Tunneli Volume: (cm) 0 Undermi Wound Description Full Thickness Without Exposed Support Foul Od Classification: Structures Slough/ Wound Distinct, outline attached Margin: Exudate None Present Amount: Wound Bed Granulation Amount: None Present (0%) Necrotic Amount: None Present (0%) Fascia Fat Lay Tendon Muscle Joint E Bone Ex Electronic Signature(s) Signed: 09/13/2019 4:06:15 PM By: Mikeal Hawthorne EMT/HBOT Signed: 10/07/2019 10:53:45 AM By: Baruch Gouty RN, BSN Previous Signature: 09/07/2019 6:11:25 PM Version By: Alla German Entered ByMikeal Hawthorne on 11/09 or After Cleansing: No Fibrino No Exposed Structure Exposed: No er (Subcutaneous Tissue) Exposed: No Exposed: No Exposed: No xposed: No posed: No nda RN, BSN /2020 08:53:36 ion in Area: 100% ion in Volume: 100% alization: Large (67-100%) ng: No ning:  No -------------------------------------------------------------------------------- Wound Assessment Details Patient Name: Date of Service: [name], [name] 09/07/2019 2:30 PM Medical Record [id number] Patient Account Number: [id number] Date of Birth/Sex: Treating RN: 14-Jan-1951 (68 y.o. [name]) [name] Primary Care Nakyia Dau: [name] Other Clinician: Referring Evren Shankland: Treating Dannie Woolen/Extender:Robson, Peyton Najjar, [name] in Treatment: 189 Wound Status Wound Number: [id number] Primary Lymphedema Etiology: Wound Location: Left Lower Leg - Posterior, Circumferential Wound Open Status: Wounding Event: Blister Comorbid Chronic sinus problems/congestion, Date Acquired: 08/27/2019 History: Arrhythmia, Hypertension, Peripheral Arterial Weeks Of Treatment: 1 Disease, Type II Diabetes, History of Burn, Clustered Wound: No Gout, Confinement Anxiety Photos Wound Measurements Length: (  cm) 5.2 % Reduct Width: (cm) 5 % Reduct Depth: (cm) 0.1 Epitheli Area: (cm) 20.42 Tunneli Volume: (cm) 2.042 Undermi Wound Description Classification: Full Thickness Without Exposed Support Foul Od Structures Due to Wound Slough/ Distinct, outline attached Margin: Exudate Medium Amount: Exudate Serosanguineous Type: Exudate red, brown red, brown Color: Wound Bed Granulation Amount: Large (67-100%) Granulation Quality: Red, Pink Fascia Ex Necrotic Amount: None Present (0%) Fat Layer Tendon Ex Muscle Ex Joint Exp Bone Expo or After Cleansing: Yes Product Use: No Fibrino Yes Exposed Structure posed: No (Subcutaneous Tissue) Exposed: Yes posed: No posed: No osed: No sed: No ion in Area: 94.5% ion in Volume: 94.5% alization: Medium (34-66%) ng: No ning: No Electronic Signature(s) Signed: 09/13/2019 4:06:15 PM By: Mikeal Hawthorne EMT/HBOT Signed: 10/13/2019 12:05:18 PM By: [name] RN Previous Signature: 09/07/2019 6:11:25 PM Version By: Baruch Gouty RN,  BSN Entered By: Mikeal Hawthorne on 09/13/2019 08:52:42 -------------------------------------------------------------------------------- Wound Assessment Details Patient Name: Date of Service: [name] 09/07/2019 2:30 PM Medical Record [id number] Patient Account Number: [id number] Date of Birth/Sex: Treating RN: January 21, 1951 (68 y.o. [name]) [name] Primary Care Creedon Danielski: [name] Other Clinician: Referring Everline Mahaffy: Treating Onita Pfluger/Extender:Robson, Peyton Najjar, [name] in Treatment: 189 Wound Status Wound Number: [id number] Primary Lymphedema Etiology: Wound Location: Left Lower Leg - Medial Wound Open Wounding Event: Blister Status: Date Acquired: 09/07/2019 Comorbid Chronic sinus problems/congestion, Weeks Of Treatment: 0 History: Arrhythmia, Hypertension, Peripheral Arterial Clustered Wound: No Disease, Type II Diabetes, History of Burn, Gout, Confinement Anxiety Photos Wound Measurements Length: (cm) 2.2 % Reductio Width: (cm) 6 % Reductio Depth: (cm) 0.1 Epithelial Area: (cm) 10.367 Tunneling Volume: (cm) 1.037 Undermi Wound Description Classification: Full Thickness Without Exposed Support Structures Wound Flat and Intact Margin: Exudate Large Amount: Exudate Serosanguineous Type: Exudate red, brown Color: Wound Bed Granulation Amount: Large (67-100%) Granulation Quality: Red Necrotic Amount: None Present (0%) Foul Odor After Cleansing: No Slough/Fibrino No Exposed Structure Fascia Exposed: No Fat Layer (Subcutaneous Tissue) Exposed: Yes Tendon Exposed: No Muscle Exposed: No Joint Exposed: No Bone Exposed: No n in Area: 0% n in Volume: 0% ization: Small (1-33%) : No ning: No Electronic Signature(s) Signed: 09/13/2019 4:06:15 PM By: Mikeal Hawthorne EMT/HBOT Signed: 10/13/2019 12:05:18 PM By: [name] RN Previous Signature: 09/07/2019 6:11:25 PM Version By: Baruch Gouty RN, BSN Entered By: Mikeal Hawthorne on 09/13/2019  08:48:58 -------------------------------------------------------------------------------- Wound Assessment Details Patient Name: Date of Service: [name] 09/07/2019 2:30 PM Medical Record [id number] Patient Account Number: [id number] Date of Birth/Sex: Treating RN: [birth year] (68 y.o. [name]) [name] Primary Care Deniqua Perry: [name] Other Clinician: Referring Rahul Malinak: Treating Holden Maniscalco/Extender:Robson, Peyton Najjar, [name] in Treatment: 189 Wound Status Wound Number: [id number] Primary Lymphedema Etiology: Wound Location: Left Lower Leg - Anterior Wound Open Wounding Event: Blister Status: Date Acquired: 09/07/2019 Comorbid Chronic sinus problems/congestion, Weeks Of Treatment: 0 History: Arrhythmia, Hypertension, Peripheral Arterial Clustered Wound: No Disease, Type II Diabetes, History of Burn, Gout, Confinement Anxiety Photos Wound Measurements Length: (cm) 1.7 % Reduct Width: (cm) 1.7 % Reduct Depth: (cm) 0.1 Epitheli Area: (cm) 2.27 Tunneli Volume: (cm) 0.227 Undermi Wound Description Classification: Full Thickness Without Exposed Support Foul Od Structures Slough/ Wound Flat and Intact Margin: Exudate Medium Amount: Exudate Serosanguineous Type: Exudate red, brown Color: Wound Bed Granulation Amount: Large (67-100%) Granulation Quality: Red Fascia Necrotic Amount: None Present (0%) Fat Lay Tendon Muscle Joint E Bone Ex or After Cleansing: No Fibrino No Exposed Structure Exposed: No er (Subcutaneous Tissue) Exposed: Yes Exposed: No Exposed: No xposed: No posed:  No ion in Area: 0% ion in Volume: 0% alization: Medium (34-66%) ng: No ning: No Electronic Signature(s) Signed: 09/13/2019 4:06:15 PM By: Mikeal Hawthorne EMT/HBOT Signed: 10/13/2019 12:05:18 PM By: [name] RN Previous Signature: 09/07/2019 6:11:25 PM Version By: Baruch Gouty RN, BSN Entered By: Mikeal Hawthorne on 09/13/2019  08:51:16 -------------------------------------------------------------------------------- Wound Assessment Details Patient Name: Date of Service: [name] 09/07/2019 2:30 PM Medical Record [id number] Patient Account Number: [id number] Date of Birth/Sex: Treating RN: Feb 02, 1951 (68 y.o. [name]) [name] Primary Care Anja Neuzil: [name] Other Clinician: Referring Kayleena Eke: Treating Kaeleigh Westendorf/Extender:Robson, Peyton Najjar, [name] in Treatment: 189 Wound Status Wound Number: [id number] Primary Lymphedema Etiology: Wound Location: Left Lower Leg - Lateral Wound Open Wounding Event: Gradually Appeared Status: Date Acquired: 09/07/2019 Comorbid Chronic sinus problems/congestion, Weeks Of Treatment: 0 History: Arrhythmia, Hypertension, Peripheral Arterial Clustered Wound: No Disease, Type II Diabetes, History of Burn, Gout, Confinement Anxiety Photos Wound Measurements Length: (cm) 1.6 % Reduct Width: (cm) 0.4 % Reduct Depth: (cm) 0.1 Epitheli Area: (cm) 0.503 Tunneli Volume: (cm) 0.05 Undermi Wound Description Classification: Full Thickness Without Exposed Support Foul Odo Structures Slough/F Wound Flat and Intact Margin: Exudate Medium Amount: Exudate Serosanguineous Type: Exudate red, brown Color: Wound Bed Granulation Amount: Large (67-100%) Granulation Quality: Red Fascia E Necrotic Amount: None Present (0%) Fat Laye Tendon E Muscle E Joint Ex Bone Exp r After Cleansing: No ibrino No Exposed Structure xposed: No r (Subcutaneous Tissue) Exposed: Yes xposed: No xposed: No posed: No osed: No ion in Area: 0% ion in Volume: 0% alization: Small (1-33%) ng: No ning: No Electronic Signature(s) Signed: 09/13/2019 4:06:15 PM By: Mikeal Hawthorne EMT/HBOT Signed: 10/13/2019 12:05:18 PM By: [name] RN Previous Signature: 09/07/2019 6:11:25 PM Version By: Baruch Gouty RN, BSN Entered By: Mikeal Hawthorne on 09/13/2019  08:52:11 -------------------------------------------------------------------------------- Wound Assessment Details Patient Name: Date of Service: [name] 09/07/2019 2:30 PM Medical Record [id number] Patient Account Number: [id number] Date of Birth/Sex: Treating RN: 1951/06/03 (68 y.o. [name]) [name] Primary Care Chellsea Beckers: [name] Other Clinician: Referring Vahan Wadsworth: Treating Azaliah Carrero/Extender:Robson, Peyton Najjar, [name] in Treatment: 189 Wound Status Wound Number: [id number] Primary Lymphedema Etiology: Wound Location: Left Toe Fourth Wound Open Wounding Event: Gradually Appeared Status: Date Acquired: 09/07/2019 Comorbid Chronic sinus problems/congestion, Weeks Of Treatment: 0 History: Arrhythmia, Hypertension, Peripheral Arterial Clustered Wound: No Disease, Type II Diabetes, History of Burn, Gout, Confinement Anxiety Photos Wound Measurements Length: (cm) 0.5 % Reduct Width: (cm) 0.6 % Reduct Depth: (cm) 0.1 Epitheli Area: (cm) 0.236 Tunneli Volume: (cm) 0.024 Undermi Wound Description Classification: Full Thickness Without Exposed Support Foul Od Structures Slough/ Wound Flat and Intact Margin: Exudate Medium Amount: Exudate Serous Type: Exudate amber Color: Wound Bed Granulation Amount: None Present (0%) Necrotic Amount: Large (67-100%) Fascia Necrotic Quality: Adherent Slough Fat Lay Tendon Muscle Joint Exp Bone Expo or After Cleansing: No Fibrino Yes Exposed Structure Exposed: No er (Subcutaneous Tissue) Exposed: Yes Exposed: No Exposed: No osed: No sed: No ion in Area: 0% ion in Volume: 0% alization: None ng: No ning: No Electronic Signature(s) Signed: 09/13/2019 4:06:15 PM By: Mikeal Hawthorne EMT/HBOT Signed: 10/13/2019 12:05:18 PM By: [name] RN Previous Signature: 09/07/2019 6:11:25 PM Version By: Baruch Gouty RN, BSN Entered By: Mikeal Hawthorne on 09/13/2019  08:48:14 -------------------------------------------------------------------------------- Vitals Details Patient Name: Date of Service: [name] 09/07/2019 2:30 PM Medical Record [id number] Patient Account Number: [id number] Date of Birth/Sex: Treating RN: 11-Jul-1951 (68 y.o. Oval Linsey Primary Care Sumedha Munnerlyn: [name] Other Clinician: Referring Lorrin Bodner: Treating Lynette Noah/Extender:Robson,  Peyton Najjar, [name] in Treatment: 189 Vital Signs Time Taken: 15:15 Temperature (F): 97.8 Height (in): 70 Pulse (bpm): 66 Weight (lbs): 380.2 Respiratory Rate (breaths/min): 18 Body Mass Index (BMI): 54.5 Blood Pressure (mmHg): 194/57 Reference Range: 80 - 120 mg / dl Electronic Signature(s) Signed: 10/12/2019 3:00:22 PM By: Sandre Kitty Entered By: Sandre Kitty on 09/07/2019 15:19:21

## 2019-10-14 ENCOUNTER — Ambulatory Visit: Payer: Medicare Other | Admitting: Infectious Diseases

## 2019-10-14 DIAGNOSIS — R29898 Other symptoms and signs involving the musculoskeletal system: Secondary | ICD-10-CM | POA: Diagnosis not present

## 2019-10-14 DIAGNOSIS — I890 Lymphedema, not elsewhere classified: Secondary | ICD-10-CM | POA: Diagnosis not present

## 2019-10-15 ENCOUNTER — Telehealth (INDEPENDENT_AMBULATORY_CARE_PROVIDER_SITE_OTHER): Payer: Medicare Other | Admitting: Infectious Diseases

## 2019-10-15 ENCOUNTER — Other Ambulatory Visit: Payer: Self-pay

## 2019-10-15 ENCOUNTER — Encounter: Payer: Self-pay | Admitting: Infectious Diseases

## 2019-10-15 DIAGNOSIS — L03119 Cellulitis of unspecified part of limb: Secondary | ICD-10-CM

## 2019-10-15 NOTE — Progress Notes (Signed)
Name: [name]  DOB: [birth year]  MRN: [id number]  PCP: [name], MD  Referring Provider: Dr. Leanord Hawking   Virtual Visit via Mychart Video  I connected with [name] on 10/15/19 at 10:15 AM EST by MyChart Portal and verified that I am speaking with the correct person using two identifiers.   I discussed the limitations, risks, security and privacy concerns of performing an evaluation and management service by telephone and the availability of in person appointments. I also discussed with the patient that there may be a patient responsible charge related to this service. The patient expressed understanding and agreed to proceed.  Patient Location: [facility] in [place], [place] Provider Location: [facility]  Patient Active Problem List   Diagnosis Date Noted  . Uncontrolled type 2 diabetes mellitus with hyperglycemia (HCC) 03/04/2018  . Chronic diastolic heart failure (HCC) 12/24/2016  . Lymphedema 02/22/2016  . 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  . Chronic headaches 05/10/2015  . Equivocal stress test 10/24/2014  . Atrial fibrillation (HCC) 08/15/2014  . Healthcare maintenance 08/15/2014  . Type 2 diabetes mellitus with diabetic neuropathy (HCC) 08/15/2014  . 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 Recurrent cellulitis of lower extremity 08/02/2013  . Lymphedema of both lower extremities 11/04/2008  . Morbid obesity due to excess calories (HCC) 12/14/2007  . Venous (peripheral) insufficiency 07/22/2007  . Diabetic renal disease (HCC) 04/20/2007  . DM2 (diabetes mellitus, type 2) (HCC) 08/11/2006  . Hypertension, essential 08/11/2006  . ALLERGIC RHINITIS 08/11/2006  . ASTHMA 08/11/2006  . Asthma 08/11/2006   Subjective:  Brief Narrative:   [name] is a 68 y.o. male referred for evaluation of his recurrent cellulitis of his lower extremities. He has been followed closely by the [facility] since March 2017. He has a long  standing history of morbid obesity, diabetes requiring insulin, B/L venous stasis with ulcerations, chronic lower extremity swelling / lymphadenopathy. ABI in April 2017 did not require intervention and LE venous dopplers 10/2016 without deep or superficial thrombus. Previously we tried prophylactic Amoxicillin and Clindamycin without effect. He has been doing well on Bicillin 1.2 million units IM Q4w.   CC:  Follow up on recurrent lower extremity cellulitis infections on prophylactic bicillin injections.    HPI: [name] has been in good health since her last office visit.  He has not had any changes to his medical history or hospitalizations/ER visits.  He has continued the monthly Bicillin injections.  He has done very well up until September of this year when he was placed on doxycycline for cellulitis following a blister that formed on the left leg when wraps were applied too tightly and too low.  He shows me a photo of his leg currently and it appears that he had a large blister that has resulted in cellulitis again.  He was initially referred to go to [facility] for intravenous antibiotics however he declined due to concern over Covid pandemic and exposure risk.  He was given doxycycline and again improved with the resolution of his pain.  He states that he is continue to go back to see the wound clinic for wraps routinely.  He is now working with the lymphedema clinic in [place] through [facility].  He still gets significant lymphedema left greater than right.  Hopeful that this will resolve or improve with suggestions from the clinic. No concerns for reactions to the Bicillin injections.  Review of Systems  Constitutional: Negative for chills, fever, malaise/fatigue  and weight loss.  HENT: Negative for sore throat.   Respiratory: Negative for cough and sputum production.   Cardiovascular: Positive for leg swelling. Negative for chest pain, palpitations and claudication.    Gastrointestinal: Negative for abdominal pain, diarrhea and vomiting.  Genitourinary: Negative for dysuria and flank pain.  Musculoskeletal: Negative for joint pain.  Skin: Negative for rash.  Neurological: Negative for dizziness and headaches.    Past Medical History:  Diagnosis Date  . ALLERGIC RHINITIS 08/11/2006  . ASTHMA 08/11/2006  . Chronic atrial fibrillation ([place]) 08/2014  . Chronic combined systolic and diastolic CHF (congestive heart failure) [facility])    Cardiology f/u 12/2017: pt volume overloaded (R heart dysf suspected), BNP very high, lasix increased.  Repeat echo 12/2017: normal LV EF, mild DD, +RV syst dysfxn, mod pulm HTN, biatrial enlgmt.  . Chronic constipation   . Chronic renal insufficiency, stage 2 (mild)    Borderline stage III (GFR 60s).  . DIABETES MELLITUS, TYPE II 08/11/2006  . DM W/RENAL MANIFESTATIONS, TYPE II 04/20/2007  . HYPERTENSION 08/11/2006  . MRSA infection 06/2018   LL venous stasis ulcer infected  . Normocytic anemia 2016-2019   03/2018 B12 normal, iron ok (ferritin borderline low).  Plan repeat CBC at f/u summer 2019 and if decreased from baseline will check hemoccults and iron labs again.  . OBESITY, MORBID 12/14/2007  . OSA (obstructive sleep apnea)    to get sleep study with [facility]) as of 12/03/2018 consult.  . 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  [id number] ID started him on amoxil prophylaxis---ineffective.  End 2018/Jan 2019 penicillin G injections prophyl helpful but pt declined to continue this as of 01/2018 ID f/u.  ID talked him into resuming monthly penicillin G as of 02/2018 f/u.    Marland Kitchen Restless leg syndrome    Rx'd clonazepam 09/2017 and pt refused to take it after reading the medication's potential side effects.  . Venous stasis ulcers of both lower extremities (HCC)    Severe lymphedema.  wound clinic care ongoing as of 01/2018   Outpatient Medications  Prior to Visit  Medication Sig Dispense Refill  . arginine 500 MG tablet Take by mouth.    Marland Kitchen b complex vitamins capsule Take 1 capsule by mouth every morning.    . butalbital-acetaminophen-caffeine (FIORICET WITH CODEINE) 50-325-40-30 MG capsule TAKE 1 TO 2 CAPSULES BY  MOUTH EVERY 6 HOURS AS  NEEDED FOR HEADACHE(S)  MANUFACTURER RECOMMENDS NOT EXCEEDING 6 CAPSULES/DAY 30 capsule 0  . cetirizine (ZYRTEC) 10 MG tablet Take 10 mg by mouth daily.    . Coenzyme Q10 (CO Q 10) 10 MG CAPS Take by mouth. Reported on 02/19/2016    . diltiazem (TIAZAC) 240 MG 24 hr capsule TAKE 1 CAPSULE BY MOUTH  DAILY 90 capsule 1  . doxycycline (MONODOX) 100 MG capsule Take 100 mg by mouth 2 (two) times daily.    . furosemide (LASIX) 40 MG tablet TAKE 2 TABLETS BY MOUTH TWO TIMES DAILY 360 tablet 1  . Glutamine 500 MG CAPS Take by mouth.    Marland Kitchen HUMALOG MIX 75/25 KWIKPEN (75-25) 100 UNIT/ML Kwikpen INJECT SUBCUTANEOUSLY 25  UNITS EVERY MORNING AND 20  UNITS EVERY EVENING AT  SUPPER 45 mL 3  . HYDROcodone-acetaminophen (NORCO/VICODIN) 5-325 MG tablet 1-2 tabs po bid prn pain 60 tablet 0  . Insulin Pen Needle (B-D ULTRAFINE III SHORT PEN) 31G X 8 MM MISC USE 1  TWICE DAILY  100 each 0  . Insulin Pen Needle (BD ULTRA-FINE PEN NEEDLES) 29G X 12.7MM MISC 1 each by Other route 2 (two) times daily. 200 each 2  . Insulin Pen Needle (BD ULTRA-FINE PEN NEEDLES) 29G X 12.7MM MISC USE TWICE DAILY 200 each 3  . lisinopril (ZESTRIL) 40 MG tablet TAKE 1 TABLET BY MOUTH  EVERY DAY 90 tablet 1  . metFORMIN (GLUCOPHAGE) 1000 MG tablet TAKE 1 TABLET BY MOUTH  TWICE A DAY WITH MEALS 180 tablet 1  . metoprolol tartrate (LOPRESSOR) 50 MG tablet Take 25 mg by mouth 2 (two) times daily.    . Multiple Vitamin (MULTIVITAMIN) tablet Take 1 tablet by mouth daily.    [name]. ONETOUCH ULTRA test strip CHECK BLOOD SUGAR TWO TIMES DAILY 200 strip 3  . rivaroxaban (XARELTO) 20 MG TABS tablet TAKE 1 TABLET BY MOUTH ONCE DAILY WITH SUPPER 90 tablet 3  . saw  palmetto 500 MG capsule Take 500 mg by mouth daily.    Marland Kitchen. terazosin (HYTRIN) 10 MG capsule TAKE 1 CAPSULE BY MOUTH  ONCE A DAY AT BEDTIME 90 capsule 3  . vitamin E 400 UNIT capsule Take 400 Units by mouth every morning. Selenium 50mg      Facility-Administered Medications Prior to Visit  Medication Dose Route Frequency Provider Last Rate Last Admin  . penicillin g benzathine (BICILLIN LA) 1200000 UNIT/2ML injection 600,000 Units  600,000 Units Intramuscular Q30 days Jeoffrey MassedMcGowen, [name] H, MD   600,000 Units at 04/05/19 0905  . penicillin g benzathine (BICILLIN LA) 1200000 UNIT/2ML injection 600,000 Units  600,000 Units Intramuscular Q30 days Jeoffrey MassedMcGowen, [name] H, MD   600,000 Units at 04/05/19 0905  . penicillin g benzathine (BICILLIN LA) 1200000 UNIT/2ML injection 600,000 Units  600,000 Units Intramuscular Q30 days Jeoffrey MassedMcGowen, [name] H, MD   600,000 Units at 09/14/19 [phone]  . penicillin g benzathine (BICILLIN LA) 1200000 UNIT/2ML injection 600,000 Units  600,000 Units Intramuscular Q30 days Jeoffrey MassedMcGowen, [name] H, MD   600,000 Units at 09/14/19 [id number]   Allergies  Allergen Reactions  . Other Other (See Comments)    Sneezing, coughing    Physical Exam and Objective Findings: There were no vitals filed for this visit. There is no height or weight on file to calculate BMI.  Physical Exam  Constitutional: He is oriented to person, place, and time and well-developed, well-nourished, and in no distress.  HENT:  Head: Normocephalic.  Mouth/Throat: Oropharynx is clear and moist.  Eyes: Pupils are equal, round, and reactive to light. No scleral icterus.  Pulmonary/Chest: Effort normal.  Musculoskeletal:     Cervical back: Normal range of motion.  Neurological: He is alert and oriented to person, place, and time.  Psychiatric: Mood, affect and judgment normal.     ASSESSMENT & PLAN:  Problem List Items Addressed This Visit      Unprioritized   Recurrent cellulitis of lower extremity    He has ongoing risk  factors that predispose him to a significantly higher risk for relapse regarding cellulitis given his morbid obesity with BMI greater than 50, severe lymphedema, diabetes.  While he has had 2 flares since we saw him last June I still think this is improved compared to before we initiated these routinely.  I would like to continue these for another 6 months with reassessment of his risk factors thereafter.   Should he have a flare that is not responsive to doxycycline, could consider outpatient infusion of dalbavancin, a long-acting intravenous option that treats staph and strep very well  including MRSA.  This would be an option barring that he is not hemodynamically unstable and would allow [place] time to arrange for the infusion appointment.  He will keep this in mind.        6 month follow up Video Visit.   Rexene Alberts, MSN, [facility]  10/15/2019

## 2019-10-15 NOTE — Assessment & Plan Note (Signed)
He has ongoing risk factors that predispose him to a significantly higher risk for relapse regarding cellulitis given his morbid obesity with BMI greater than 50, severe lymphedema, diabetes.  While he has had 2 flares since we saw him last June I still think this is improved compared to before we initiated these routinely.  I would like to continue these for another 6 months with reassessment of his risk factors thereafter.   Should he have a flare that is not responsive to doxycycline, could consider outpatient infusion of dalbavancin, a long-acting intravenous option that treats staph and strep very well including MRSA.  This would be an option barring that he is not hemodynamically unstable and would allow [place] time to arrange for the infusion appointment.  He will keep this in mind.

## 2019-10-20 ENCOUNTER — Other Ambulatory Visit: Payer: Self-pay

## 2019-10-20 ENCOUNTER — Encounter (HOSPITAL_BASED_OUTPATIENT_CLINIC_OR_DEPARTMENT_OTHER): Payer: Medicare Other | Admitting: Physician Assistant

## 2019-10-20 DIAGNOSIS — L97829 Non-pressure chronic ulcer of other part of left lower leg with unspecified severity: Secondary | ICD-10-CM | POA: Diagnosis not present

## 2019-10-20 DIAGNOSIS — I482 Chronic atrial fibrillation, unspecified: Secondary | ICD-10-CM | POA: Diagnosis not present

## 2019-10-20 DIAGNOSIS — L97222 Non-pressure chronic ulcer of left calf with fat layer exposed: Secondary | ICD-10-CM | POA: Diagnosis not present

## 2019-10-20 DIAGNOSIS — L97229 Non-pressure chronic ulcer of left calf with unspecified severity: Secondary | ICD-10-CM | POA: Diagnosis not present

## 2019-10-20 DIAGNOSIS — I87333 Chronic venous hypertension (idiopathic) with ulcer and inflammation of bilateral lower extremity: Secondary | ICD-10-CM | POA: Diagnosis not present

## 2019-10-20 DIAGNOSIS — I272 Pulmonary hypertension, unspecified: Secondary | ICD-10-CM | POA: Diagnosis not present

## 2019-10-20 DIAGNOSIS — L03116 Cellulitis of left lower limb: Secondary | ICD-10-CM | POA: Diagnosis not present

## 2019-10-20 DIAGNOSIS — E11621 Type 2 diabetes mellitus with foot ulcer: Secondary | ICD-10-CM | POA: Diagnosis not present

## 2019-10-20 DIAGNOSIS — I890 Lymphedema, not elsewhere classified: Secondary | ICD-10-CM | POA: Diagnosis not present

## 2019-10-20 DIAGNOSIS — L97122 Non-pressure chronic ulcer of left thigh with fat layer exposed: Secondary | ICD-10-CM | POA: Diagnosis not present

## 2019-10-20 DIAGNOSIS — L97512 Non-pressure chronic ulcer of other part of right foot with fat layer exposed: Secondary | ICD-10-CM | POA: Diagnosis not present

## 2019-10-20 DIAGNOSIS — L97212 Non-pressure chronic ulcer of right calf with fat layer exposed: Secondary | ICD-10-CM | POA: Diagnosis not present

## 2019-10-20 DIAGNOSIS — E11622 Type 2 diabetes mellitus with other skin ulcer: Secondary | ICD-10-CM | POA: Diagnosis not present

## 2019-10-20 DIAGNOSIS — L97112 Non-pressure chronic ulcer of right thigh with fat layer exposed: Secondary | ICD-10-CM | POA: Diagnosis not present

## 2019-10-20 DIAGNOSIS — Z794 Long term (current) use of insulin: Secondary | ICD-10-CM | POA: Diagnosis not present

## 2019-10-20 DIAGNOSIS — Z9119 Patient's noncompliance with other medical treatment and regimen: Secondary | ICD-10-CM | POA: Diagnosis not present

## 2019-10-20 DIAGNOSIS — E1151 Type 2 diabetes mellitus with diabetic peripheral angiopathy without gangrene: Secondary | ICD-10-CM | POA: Diagnosis not present

## 2019-10-20 DIAGNOSIS — I872 Venous insufficiency (chronic) (peripheral): Secondary | ICD-10-CM | POA: Diagnosis not present

## 2019-10-20 NOTE — Progress Notes (Addendum)
CONNAR, [name] ([id number]) Visit Report for 10/20/2019 Chief Complaint Document Details Patient Name: Date of Service: [name], [name] 10/20/2019 10:30 AM Medical Record [id number] Patient Account Number: [id number] Date of Birth/Sex: Treating RN: 04/28/1951 (69 y.o. [name] Primary Care Provider: Nicoletta [name] Other Clinician: Referring Provider: Treating Provider/Extender:[name], [name], [name] in Treatment: 195 Information Obtained from: Patient Chief Complaint patient is here for evaluation venous/lymphedema weeping Electronic Signature(s) Signed: 10/20/2019 11:10:15 AM By: Kevin Kelp PA-C Entered By: [name] on 10/20/2019 11:10:13 -------------------------------------------------------------------------------- HPI Details Patient Name: Date of Service: [name], [name] 10/20/2019 10:30 AM Medical Record [id number] Patient Account Number: [id number] Date of Birth/Sex: Treating RN: [birth year] (68 y.o. [name] Primary Care Provider: Nicoletta [name] Other Clinician: Referring Provider: Treating Provider/Extender:[name], [name], [name] in Treatment: 195 History of Present Illness HPI Description: Referred by PCP for consultation. Patient has long standing history of BLE venous stasis, no prior ulcerations. At beginning of month, developed cellulitis and weeping. Received IM Rocephin followed by Keflex and resolved. Wears compression stocking, appr 6 months old. Not sure strength. No present drainage. 01/22/16 this is a patient who is a type II diabetic on insulin. 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 He tells me he religiously wears pressure stockings of uncertain strength. He was here with weeping edema about 8 months ago but did not have an open wound. Roughly a month ago he had a reopening on his bilateral legs. He is been using bandages and Neosporin. He does  not complain of pain. 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 Last BUN/creatinine I have is from 11/20/15 at 13 and 1.0 respectively 01/29/16; patient arrives today having tolerated the Profore wrap. He brought in his stockings and these are 18 mmHg stockings he bought from [place]. The compression here is likely inadequate. He does not complain of pain or excessive drainage she has no systemic symptoms. The wound on the right looks improved as does the one on the left although one on the left is more substantial with still tissue at risk below the actual wound area on the bilateral posterior calf 02/05/16; patient arrives with poor edema control. He states that we did put a 4 layer compression on it last week. No weight appear 5 this. 02/12/16; the area on the posterior right Has healed. The left Has a substantial wound that has necrotic surface eschar that requires a debridement with a curette. 02/16/16;the patient called or a Nurse visit secondary to increased swelling. He had been in earlier in the week with his right leg healed. He was transitioned to is on pressure stocking on the right leg with the only open wound on the left, a substantial area on the left posterior calf. Note he has a history of severe lower extremity edema, he has a history of chronic atrial fibrillation but not heart failure per my notes but I'll need to research this. He is not complaining of chest pain shortness of breath or orthopnea. The intake nurse noted blisters on the previously closed right leg 02/19/16; this is the patient's regular visit day. I see him on Friday with escalating edema new wounds on the right leg and clear signs of at least right ventricular heart failure. I increased his Lasix to 40 twice a day. He is returning currently in follow-up. States he  is noticed a decrease in that the  edema 02/26/16 patient's legs have much less edema. There is nothing really open on the right leg. The left leg has improved condition of the large superficial wound on the posterior left leg 03/04/16; edema control is very much better. The patient's right leg wounds have healed. On the left leg he continues to have severe venous inflammation on the posterior aspect of the left leg. There is no tenderness and I don't think any of this is cellulitis. 03/11/16; patient's right leg is married healed and he is in his own stocking. The patient's left leg has deteriorated somewhat. There is a lot of erythema around the wound on the posterior left leg. There is also a significant rim of erythema posteriorly just above where the wrap would've ended there is a new wound in this location and a lot of tenderness. Can't rule out cellulitis in this area. 03/15/16; patient's right leg remains healed and he is in his own stocking. The patient's left leg is much better than last review. His major wound on the posterior aspect of his left Is almost fully epithelialized. He has 3 small injuries from the wraps. Really. Erythema seems a lot better on antibiotics 03/18/16; right leg remains healed and he is in his own stocking. The patient's left leg is much better. The area on the posterior aspect of the left calf is fully epithelialized. His 3 small injuries which were wrap injuries on the left are improved only one seems still open his erythema has resolved 03/25/16; patient's right leg remains healed and he is in his own stocking. There is no open area today on the left leg posterior leg is completely closed up. His wrap injuries at the superior aspect of his leg are also resolved. He looks as though he has some irritation on the dorsal ankle but this is fully epithelialized without evidence of infection. 03/28/16; we discharged this patient on Monday. Transitioned him into his own stocking. There were problems almost  immediately with uncontrolled swelling weeping edema multiple some of which have opened. He does not feel systemically unwell in particular no chest pain no shortness of breath and he does not feel 04/08/16; the edema is under better control with the Profore light wrap but he still has pitting edema. There is one large wound anteriorly 2 on the medial aspect of his left leg and 3 small areas on the superior posterior calf. Drainage is not excessive he is tolerating a Profore light well 04/15/16; put a Profore wrap on him last week. This is controlled is edema however he had a lot of pain on his left anterior foot most of his wounds are healed 04/22/16 once again the patient has denuded areas on the left anterior foot which he states are because his wrap slips up word. He saw his primary physician today is on Lasix 40 twice a day and states that he his weight is down 20 pounds over the last 3 months. 04/29/16: Much improved. left anterior foot much improved. He is now on Lasix 80 mg per day. Much improved edema control 05/06/16; I was hoping to be able to discharge him today however once again he has blisters at a low level of where the compression was placed last week mostly on his left lateral but also his left medial leg and a small area on the anterior part of the left foot. 05/09/16; apparently the patient went home after his appointment on 7/4 later in  the evening developing pain in his upper medial thigh together with subjective fever and chills although his temperature was not taken. The pain was so intense he felt he would probably have to call 911. However he then remembered that he had leftover doxycycline from a previous round of antibiotics and took these. By the next morning he felt a lot better. He called and spoke to one of our nurses and I approved doxycycline over the phone thinking that this was in relation to the wounds we had previously seen although they were definitely were not. The  patient feels a lot better old fever no chills he is still working. Blood sugars are reasonably controlled 05/13/16; patient is back in for review of his cellulitis on his anterior medial upper thigh. He is taking doxycycline this is a lot better. Culture I did of the nodular area on the dorsal aspect of his foot grew MRSA this also looks a lot better. 05/20/16; the patient is cellulitis on the medial upper thigh has resolved. All of his wound areas including the left anterior foot, areas on the medial aspect of the left calf and the lateral aspect of the calf at all resolved. He has a new blister on the left dorsal foot at the level of the fourth toe this was excised. No evidence of infection 05/27/16; patient continues to complain weeping edema. He has new blisterlike wounds on the left anterior lateral and posterior lateral calf at the top of his wrap levels. The area on his left anterior foot appears better. He is not complaining of fever, pain or pruritus in his feet. 05/30/16; the patient's blisters on his left anterior leg posterior calf all look improved. He did not increase the Lasix 100 mg as I suggested because he was going to run out of his 40 mg tablets. He is still having weeping edema of his toes 06/03/16; I renewed his Lasix at 80 mg once a day as he was about to run out when I last saw him. He is on 80 mg of Lasix now. I have asked him to cut down on the excessive amount of water he was drinking and asked him to drink according to his thirst mechanisms 06/12/2016 -- was seen 2 days ago and was supposed to wear his compression stockings at home but he is developed lymphedema and superficial blisters on the left lower extremity and hence came in for a review 06/24/16; the remaining wound is on his left anterior leg. He still has edema coming from between his toes. There is lymphedema here however his edema is generally better than when I last saw this. He has a history of  atrial fibrillation but does not have a known history of congestive heart failure nevertheless I think he probably has this at least on a diastolic basis. 07/01/16 I reviewed his echocardiogram from January 2017. This was essentially normal. He did not have LVH, EF of 55-60%. His right ventricular function was normal although he did have trivial tricuspid and pulmonic regurgitation. This is not audible on exam however. I increased his Lasix to do massive edema in his legs well above his knees I think in early July. He was also drinking an excessive amount of water at the time. 07/15/16; missed his appointment last week because of the Labor Day holiday on Monday. He could not get another appointment later in the week. Started to feel the wrap digging in superiorly so we remove the top half and the bottom  half of his wrap. He has extensive erythema and blistering superiorly in the left leg. Very tender. Very swollen. Edema in his foot with leaking edema fluid. He has not been systemically unwell 07/22/16; the area on the left leg laterally required some debridement. The medial wounds look more stable. His wrap injury wounds appear to have healed. Edema and his foot is better, weeping edema is also better. He tells me he is meeting with the supplier of the external compression pumps at work 08/05/16; the patient was on vacation last week in [facility]. His wrap is been on for an extended period of time. Also over the weekend he developed an extensive area of tender erythema across his anterior medial thigh. He took to doxycycline yesterday that he had leftover from a previous prescription. The patient complains of weeping edema coming out of his toes 08/08/16; I saw this patient on 10/2. He was tender across his anterior thigh. I put him on doxycycline. He returns today in follow-up. He does not have any open wounds on his lower leg, he still has edema weeping into his toes. 08/12/16; patient was seen  back urgently today to follow-up for his extensive left thigh cellulitis/erysipelas. He comes back with a lot less swelling and erythema pain is much better. I believe I gave him Augmentin and Cipro. His wrap was cut down as he stated a roll down his legs. He developed blistering above the level of the wrap that remained. He has 2 open blisters and 1 intact. 08/19/16; patient is been doing his primary doctor who is increased his Lasix from 40-80 once a day or 80 already has less edema. Cellulitis has remained improved in the left thigh. 2 open areas on the posterior left calf 08/26/16; he returns today having new open blisters on the anterior part of his left leg. He has his compression pumps but is not yet been shown how to use some vital representative from the supplier. 09/02/16 patient returns today with no open wounds on the left leg. Some maceration in his plantar toes 09/10/2016 -- Dr. Leanord Hawking had recently discharged him on 09/02/2016 and he has come right back with redness swelling and some open ulcers on his left lower extremity. He says this was caused by trying to apply his compression stockings and he's been unable to use this and has not been able to use his lymphedema pumps. He had some doxycycline leftover and he has started on this a few days ago. 09/16/16; there are no open wounds on his leg on the left and no evidence of cellulitis. 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 He does not complain of symptoms here. I am not clear use using his external compression pumps. 09/23/16; I have not seen this patient in 2 weeks. He canceled his appointment 10 days ago as he was going on vacation. He tells me that on Monday he noticed a large area on his posterior left leg which is been draining copiously and is reopened into a large wound. He is been using ABDs and the external part of his juxtalite, according to our nurse this was  not on properly. 10/07/16; Still a substantial area on the posterior left leg. Using silver alginate 10/14/16; in general better although there is still open area which looks healthy. Still using silver alginate. He reminds me that this happen before he left for [facility]. Today while he was showering in the morning.  He had been using his juxtalite's 10/21/16; the area on his posterior left leg is fully epithelialized. However he arrives today with a large area of tender erythema in his medial and posterior left thigh just above the knee. I have marked the area. Once again he is reluctant to consider hospitalization. I treated him with oral antibiotics in the past for a similar situation with resolution I think with doxycycline however this area it seems more extensive to me. He is not complaining of fever but does have chills and says states he is thirsty. His blood sugar today was in the 140s at home 10/25/16 the area on his posterior left leg is fully epithelialized although there is still some weeping edema. The large area of tenderness and erythema in his medial and posterior left thigh is a lot less tender although there is still a lot of swelling in this thigh. He states he feels a lot better. He is on doxycycline and Augmentin that I started last week. This will continued until Tuesday, December 26. I have ordered a duplex ultrasound of the left thigh rule out DVT whether there is an abscess something that would need to be drained I would also like to know. 11/01/16; he still has weeping edema from a not fully epithelialized area on his left posterior calf. Most of the rest of this looks a lot better. He has completed his antibiotics. His thigh is a lot better. Duplex ultrasound did not show a DVT in the thigh 11/08/16; he comes in today with more Denuded surface epithelium from the posterior aspect of his calf. There is no real evidence of cellulitis. The superior aspect of his wrap  appears to have put quite an indentation in his leg just below the knee and this may have contributed. He does not complain of pain or fever. We have been using silver alginate as the primary dressing. The area of cellulitis in the right thigh has totally resolved. He has been using his compression stockings once a week 11/15/16; the patient arrives today with more loss of epithelium from the posterior aspect of his left calf. He now has a fairly substantial wound in this area. The reason behind this deterioration isn't exactly clear although his edema is not well controlled. He states he feels he is generally more swollen systemically. He is not complaining of chest pain shortness of breath fever. Tells me he has an appointment with his primary physician in early February. He is on 80 mg of oral Lasix a day. He claims compliance with the external compression pumps. He is not having any pain in his legs similar to what he has with his recurrent cellulitis 11/22/16; the patient arrives a follow-up of his large area on his left lateral calf. This looks somewhat better today. He came in earlier in the week for a dressing change since I saw him a week ago. He is not complaining of any pain no shortness of breath no chest pain 11/28/16; the patient arrives for follow-up of his large area on the left lateral calf this does not look better. In fact it is larger weeping edema. The surface of the wound does not look too bad. We have been using silver alginate although I'm not certain that this is a dressing issue. 12/05/16; again the patient follows up for a large wound on the left lateral and left posterior calf this does not look better. There continues to be weeping edema necrotic surface tissue. More worrisome than  this once again there is erythema below the wound involving the distal Achilles and heel suggestive of cellulitis. He is on his feet working most of the day of this is not going well. We are  changing his dressing twice a week to facilitate the drainage. 12/12/16; not much change in the overall dimensions of the large area on the left posterior calf. This is very inflamed looking. I gave him an. Doxycycline last week does not really seem to have helped. He found the wrap very painful indeed it seems to of dog into his legs superiorly and perhaps around the heel. He came in early today because the drainage had soaked through his dressings. 12/19/16- patient arrives for follow-up evaluation of his left lower extremity ulcers. He states that he is using his lymphedema pumps once daily when there is "no drainage". He admits to not using his lipedema pumps while under current treatment. His blood sugars have been consistently between 150-200. 12/26/16; the patient is not using his compression pumps at home because of the wetness on his feet. I've advised him that I think it's important for him to use this daily. He finds his feet too wet, he can put a plastic bag over his legs while he is in the pumps. Otherwise I think will be in a vicious circle. We are using silver alginate to the major area on his left posterior calf 01/02/17; the patient's posterior left leg has further of all into 3 open wounds. All of them covered with a necrotic surface. He claims to be using his compression pumps once a day. His edema control is marginal. Continue with silver alginate 01/10/17; the patient's left posterior leg actually looks somewhat better. There is less edema, less erythema. Still has 3 open areas covered with a necrotic surface requiring debridement. He claims to be using his compression pumps once a day his edema control is better 01/17/17; the patient's left posterior calf look better last week when I saw him and his wrap was changed 2 days ago. He has noted increasing pain in the left heel and arrives today with much larger wounds extensive erythema extending down into the entire heel area especially  tender medially. He is not systemically unwell CBGs have been controlled no fever. Our intake nurse showed me limegreen drainage on his AVD pads. 01/24/17; his usual this patient responds nicely to antibiotics last week giving him Levaquin for presumed Pseudomonas. The whole entire posterior part of his leg is much better much less inflamed and in the case of his Achilles heel area much less tender. He has also had some epithelialization posteriorly there are still open areas here and still draining but overall considerably better 01/31/17- He has continue to tolerate the compression wraps. he states that he continues to use the lymphedema pumps daily, and can increase to twice daily on the weekends. He is voicing no complaints or concerns regarding his LLE ulcers 02/07/17-he is here for follow-up evaluation. He states that he noted some erythema to the left medial and anterior thigh, which he states is new as of yesterday. He is concerned about recurrent cellulitis. He states his blood sugars have been slightly elevated, this morning in the 180s 02/14/17; he is here for follow-up evaluation. When he was last here there was erythema superiorly from his posterior wound in his anterior thigh. He was prescribed Levaquin however a culture of the wound surface grew MRSA over the phone I changed him to doxycycline on Monday and things  seem to be a lot better. 02/24/17; patient missed his appointment on Friday therefore we changed his nurse visit into a physician visit today. Still using silver alginate on the large area of the posterior left thigh. He isn't new area on the dorsal left second toe 03/03/17; actually better today although he admits he has not used his external compression pumps in the last 2 days or so because of work responsibilities over the weekend. 03/10/17; continued improvement. External compression pumps once a day almost all of his wounds have closed on the posterior left calf. Better  edema control 03/17/17; in general improved. He still has 3 small open areas on the lateral aspect of his left leg however most of the area on the posterior part of his leg is epithelialized. He has better edema control. He has an ABD pad under his stocking on the right anterior lower leg although he did not let us look at that today. 03/24/17; patient arrives back in clinic today with no open areas however there are areas on the posterior left calf and anterior left calf that are less than 100% epithelialized. His edema is well controlled in the left lower leg. There is some pitting edema probably lymphedema in the left upper thigh. He uses compression pumps at home once per day. I tried to get him to do this twice a day although he is very reticent. 04/01/2017 -- for the last 2 days he's had significant redness, tenderness and weeping and came in for an urgent visit today. 04/07/17; patient still has 6 more days of doxycycline. He was seen by Dr. Meyer Russel last Wednesday for cellulitis involving the posterior aspect, lateral aspect of his Involving his heel. For the most part he is better there is less erythema and less weeping. He has been on his feet for 12 hours 2 over the weekend. Using his compression pumps once a day 04/14/17 arrives today with continued improvement. Only one area on the posterior left calf that is not fully epithelialized. He has intense bilateral venous inflammation associated with his chronic venous insufficiency disease and secondary lymphedema. We have been using silver alginate to the left posterior calf wound In passing he tells [place] today that the right leg but we have not seen in quite some time has an open area on it but he doesn't want [place] to look at this today states he will show this to [place] next week. 04/21/17; there is no open area on his left leg although he still reports some weeping edema. He showed [place] his right leg today which is the first time we've seen this leg in  a long time. He has a large area of open wound on the right leg anteriorly healthy granulation. Quite a bit of swelling in the right leg and some degree of venous inflammation. He told us about the right leg in passing last week but states that deterioration in the right leg really only happened over the weekend 04/28/17; there is no open area on the left leg although there is an irritated part on the posterior which is like a wrap injury. The wound on the right leg which was new from last week at least to [place] is a lot better. 05/05/17; still no open area on the left leg. Patient is using his new compression stocking which seems to be doing a good job of controlling the edema. He states he is using his compression pumps once per day. The right leg still has an  open wound although it is better in terms of surface area. Required debridement. A lot of pain in the posterior right Achilles marked tenderness. Usually this type of presentation this patient gives concern for an active cellulitis 05/12/17; patient arrives today with his major wound from last week on the right lateral leg somewhat better. Still requiring debridement. He was using his compression stocking on the left leg however that is reopened with superficial wounds anteriorly he did not have an open wound on this leg previously. He is still using his juxta light's once daily at night. He cannot find the time to do this in the morning as he has to be at work by 7 AM 05/19/17; right lateral leg wound looks improved. No debridement required. The concerning area is on the left posterior leg which appears to almost have a subcutaneous hemorrhagic component to it. We've been using silver alginate to all the wounds 05/26/17; the right lateral leg wound continues to look improved. However the area on the left posterior calf is a tightly adherent surface. [place] using silver alginate. Because of the weeping edema in his legs there is very little  good alternatives. 06/02/17; the patient left here last week looking quite good. Major wound on the left posterior calf and a small one on the right lateral calf. Both of these look satisfactory. He tells me that by Wednesday he had noted increased pain in the left leg and drainage. He called on Thursday and Friday to get an appointment here but we were blocked. He did not go to urgent care or his primary physician. He thinks he had a fever on Thursday but did not actually take his temperature. He has not been using his compression pumps on the left leg because of pain. I advised him to go to the emergency room today for IV antibiotics for stents of left leg cellulitis but he has refused I have asked him to take 2 days off work to keep his leg elevated and he has refused this as well. In view of this I'm going to call him and Augmentin and doxycycline. 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 However after a detailed discussion regarding the risk benefits and alternatives he refuses to get admitted to the hospital. With no other recourse I will continue him on oral antibiotics as before and hopefully he'll have his infectious disease consultation this week. 06/16/2017 -- the patient was seen today by the nurse practitioner at infectious disease Ms. Dixon. 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 He has also been advise Lotrimin cream locally. she also had wise IV antibiotics for his condition if it flares up 06/23/17; patient arrives today with drainage bilaterally although the  remaining wound on the left posterior calf after cleaning up today "highlighter yellow drainage" did not look too bad. Unfortunately he has had breakdown on the right anterior leg [previously this leg had not been open and he is using a black stocking] he went to see infectious disease and is been put on clindamycin 150 mg daily, I  did not verify the dose although I'm not familiar with using clindamycin in this dosing range, perhaps for prophylaxisoo 06/27/17; I brought this patient back today to follow-up on the wound deterioration on the right lower leg together with surrounding cellulitis. I started him on doxycycline 4 days ago. This area looks better however he comes in today with intense cellulitis on the medial part of his left thigh. This is not have a wound in this area. Extremely tender. We've been using silver alginate to the wounds on the right lower leg left lower leg with bilateral 4 layer compression he is using his external compression pumps once a day 07/04/17; patient's left medial thigh cellulitis looks better. He has not been using his compression pumps as his insert said it was contraindicated with cellulitis. His right leg continues to make improvements all the wounds are still open. We only have one remaining wound on the left posterior calf. Using silver alginate to all open areas. He is on doxycycline which I started a week ago and should be finishing I gave him Augmentin after Thursday's visit for the severe cellulitis on the left medial thigh which fortunately looks better 07/14/17; the patient's left medial thigh cellulitis has resolved. The cellulitis in his right lower calf on the right also looks better. All of his wounds are stable to improved we've been using silver alginate he has completed the antibiotics I have given him. He has clindamycin 150 mg once a day prescribed by infectious disease for prophylaxis, I've advised him to start this now. We have been using  bilateral Unna boots over silver alginate to the wound areas 07/21/17; the patient is been to see infectious disease who noted his recurrent problems with cellulitis. He was not able to tolerate prophylactic clindamycin therefore he is on amoxicillin 500 twice a day. He also had a second daily dose of Lasix added By Dr. Oneta RackMcKeown but he is not taking this. Nor is he being completely compliant with his compression pumps a especially not this week. He has 2 remaining wounds one on the right posterior lateral lower leg and one on the left posterior medial lower leg. 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 The patient has Unna boots bilaterally. 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 He has bilateral Unna boots for compression and silver alginate to his wounds. Arrives today with his legs looking as good as I have seen him in quite some time. Not surprisingly his wounds look better as well with improvement on the right lateral leg venous insufficiency wound and also the left medial leg. He is still using the compression pumps once a day 08/11/17; both legs appear to be doing better wounds on the right lateral and left medial legs look better. Skin on the right leg quite good. He is been using silver alginate as the primary dressing. I'm going to use Anasept gel calcium alginate and maintain all the secondary dressings 08/18/17; the patient continues to actually do quite well. The area on his right lateral leg is just about closed the left medial also looks better although it is still moist in this area. His edema is well controlled we have been using Anasept gel with calcium alginate and the usual secondary dressings, 4 layer compression and once daily  use of his compression  pumps "always been able to manage 09/01/17; the patient continues to do reasonably well in spite of his trip to [place]. The area on the right lateral leg is epithelialized. Left is much better but still open. He has more edema and more chronic erythema on the left leg [venous inflammation] 09/08/17; he arrives today with no open wound on the right lateral leg and decently controlled edema. Unfortunately his left leg is not nearly as in his good situation as last week.he apparently had increasing edema starting on Saturday. He edema soaked through into his foot so used a plastic bag to walk around his home. The area on the medial right leg which was his open area is about the same however he has lost surface epithelium on the left lateral which is new and he has significant pain in the Achilles area of the left foot. He is already on amoxicillin chronically for prophylaxis of cellulitis in the left leg 09/15/17; he is completed a week of doxycycline and the cellulitis in the left posterior leg and Achilles area is as usual improved. He still has a lot of edema and fluid soaking through his dressings. There is no open wound on the right leg. He saw infectious disease NP today 09/22/17;As usual 1 we transition him from our compression wraps to his stockings things did not go well. He has several small open areas on the right leg. He states this was caused by the compression wrap on his skin although he did not wear this with the stockings over them. He has several superficial areas on the left leg medially laterally posteriorly. He does not have any evidence of active cellulitis especially involving the left Achilles The patient is traveling from [facility] Saturday going to [facility]. He states he isn't attempting to get an appointment with a heel objects wound center there to change his dressings. I am not completely certain whether this will work 10/06/17; the patient came in on Friday for a  nurse visit and the nurse reported that his legs actually look quite good. He arrives in clinic today for his regular follow-up visit. He has a new wound on his left third toe over the PIP probably caused by friction with his footwear. He has small areas on the left leg and a very superficial but epithelialized area on the right anterior lateral lower leg. Other than that his legs look as good as I've seen him in quite some time. We have been using silver alginate Review of systems; no chest pain no shortness of breath other than this a 10 point review of systems negative 10/20/17; seen by Dr. Meyer Russel last week. He had taken some antibiotics [doxycycline] that he had left over. Dr. Meyer Russel thought he had candida infection and declined to give him further antibiotics. He has a small wound remaining on the right lateral leg several areas on the left leg including a larger area on the left posterior several left medial and anterior and a small wound on the left lateral. The area on the left dorsal third toe looks a lot better. ROS; Gen.; no fever, respiratory no cough no sputum Cardiac no chest pain other than this 10 point review of system is negative 10/30/17; patient arrives today having fallen in the bathtub 3 days ago. It took him a while to get up. He has pain and maceration in the wounds on his left leg which have deteriorated. He has not been using his pumps  he also has some maceration on the right lateral leg. 11/03/17; patient continues to have weeping edema especially in the left leg. This saturates his dressings which were just put on on 12/27. As usual the doxycycline seems to take care of the cellulitis on his lower leg. He is not complaining of fever, chills, or other systemic symptoms. He states his leg feels a lot better on the doxycycline I gave him empirically. He also apparently gets injections at his primary doctor's officeo Rocephin for cellulitis prophylaxis. I didn't ask him  about his compression pump compliance today I think that's probably marginal. Arrives in the clinic with all of his dressings primary and secondary macerated full of fluid and he has bilateral edema 11/10/17; the patient's right leg looks some better although there is still a cluster of wounds on the right lateral. The left leg is inflamed with almost circumferential skin loss medially to laterally although we are still maintaining anteriorly. He does not have overt cellulitis there is a lot of drainage. He is not using compression pumps. We have been using silver alginate to the wound areas, there are not a lot of options here 11/17/17; the patient's right leg continues to be stable although there is still open wounds, better than last week. The inflammation in the left leg is better. Still loss of surface layer epithelium especially posteriorly. There is no overt cellulitis in the amount of edema and his left leg is really quite good, tells me he is using his compression pumps once a day. 11/24/17; patient's right leg has a small superficial wound laterally this continues to improve. The inflammation in the left leg is still improving however we have continuous surface layer epithelial loss posteriorly. There is no overt cellulitis in the amount of edema in both legs is really quite good. He states he is using his compression pumps on the left leg once a day for 5 out of 7 days 12/01/17; very small superficial areas on the right lateral leg continue to improve. Edema control in both legs is better today. He has continued loss of surface epithelialization and left posterior calf although I think this is better. We have been using silver alginate with large number of absorptive secondary dressings 4 layer on the left Unna boot on the right at his request. He tells me he is using his compression pumps once a day 12/08/17; he has no open area on the right leg is edema control is good here. On the left leg  however he has marked erythema and tenderness breakdown of skin. He has what appears to be a wrap injury just distal to the popliteal fossa. 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 There was swelling erythema and breakdown in the right leg. He has much worse skin breakdown in the left leg as well multiple open areas medially and posteriorly as well as laterally. He tells me he has been using his compression pumps but tells me he feels that the drainage out of his leg is worse when he uses a compression pumps. To be fair to him he is been saying this for a while however I don't know that I have really been listening to this. I wonder if  the compression pumps are working properly 12/22/17;. Once again he arrives with severe erythema, weeping edema from the left greater than right leg. Noncompliance with compression pumps. New this visit he is complaining of pain on the lateral aspect of the right leg and the medial aspect of his right thigh. He apparently saw his cardiologist Dr. Debara Pickett who was ordered an echocardiogram area and I think this is a step in the right direction 12/25/17; started his doxycycline Monday night. There is still intense erythema of the right leg especially in the anterior thigh although there is less tenderness. The erythema around the wound on the right lateral calf also is less tender. He still complaining of pain in the left heel. His wounds are about the same right lateral left medial left lateral. Superficial but certainly not close to closure. He denies being systemically unwell no fever chills no abdominal pain no diarrhea 12/29/17; back in follow-up of his extensive right calf and right thigh  cellulitis. I added amoxicillin to cover possible doxycycline resistant strep. This seems to of done the trick he is in much less pain there is much less erythema and swelling. He has his echocardiogram at 11:00 this morning. X-ray of the left heel was also negative. 01/05/18; the patient arrived with his edema under much better control. Now that he is retired he is able to use his compression pumps daily and sometimes twice a day per the patient. He has a wound on the right leg the lateral wound looks better. Area on the left leg also looks a lot better. He has no evidence of cellulitis in his bilateral thighs I had a quick peak at his echocardiogram. He is in normal ejection fraction and normal left ventricular function. He has moderate pulmonary hypertension moderately reduced right ventricular function. One would have to wonder about chronic sleep apnea although he says he doesn't snore. He'll review the echocardiogram with his cardiologist. 01/12/18; the patient arrives with the edema in both legs under exemplary control. He is using his compression pumps daily and sometimes twice daily. His wound on the right lateral leg is just about closed. He still has some weeping areas on the posterior left calf and lateral left calf although everything is just about closed here as well. I have spoken with Jeri Lager who is the patient's nurse practitioner and infectious disease. She was concerned that the patient had not understood that the parenteral penicillin injections he was receiving for cellulitis prophylaxis was actually benefiting him. I don't think the patient actually saw that I would tend to agree we were certainly dealing with less infections although he had a serious one last month. 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 He is healed. He'll be placed in juxtalite compression wraps and increase his lymphedema pumps to twice daily. We will follow up  again next week to ensure there are no issues with the new regiment. 01/20/18-he is here for evaluation of bilateral lower extremity weeping edema. Yesterday he was placed in compression wrap to the right lower extremity and compression stocking to left lower shrubbery. He states he uses lymphedema pumps last night and again this morning and noted a blister to the left lower extremity. On exam he was noted to have drainage to the right lower extremity. He will be placed in Unna boots bilaterally and follow-up next week 01/26/18; patient was actually discharged a week ago to his own juxta light stockings only to return the next  day with bilateral lower extremity weeping edema.he was placed in bilateral Unna boots. He arrives today with pain in the back of his left leg. There is no open area on the right leg however there is a linear/wrap injury on the left leg and weeping edema on the left leg posteriorly. I spoke with infectious disease about 10 days ago. They were disappointed that the patient elected to discontinue prophylactic intramuscular penicillin shots as they felt it was particularly beneficial in reducing the frequency of his cellulitis. I discussed this with the patient today. He does not share this view. He'll definitely need antibiotics today. Finally he is traveling to [place] and trauma leaving this Saturday and returning a week later and he does not travel with his pumps. He is going by car 01/30/18; patient was seen 4 days ago and brought back in today for review of cellulitis in the left leg posteriorly. I put him on amoxicillin this really hasn't helped as much as I might like. He is also worried because he is traveling to [facility] trauma by car. Finally we will be rewrapping him. There is no open area on the right leg over his left leg has multiple weeping areas as usual 02/09/18; The same wrap on for 10 days. He did not pick up the last doxycycline I prescribed for him. He  apparently took 4 days worth he already had. There is nothing open on his right leg and the edema control is really quite good. He's had damage in the left leg medially and laterally especially probably related to the prolonged use of Unna boots 02/12/18; the patient arrived in clinic today for a nurse visit/wrap change. He complained of a lot of pain in the left posterior calf. He is taking doxycycline that I previously prescribed for him. Unfortunately even though he used his stockings and apparently used to compression pumps twice a day he has weeping edema coming out of the lateral part of his right leg. This is coming from the lower anterior lateral skin area. 02/16/18; the patient has finished his doxycycline and will finish the amoxicillin 2 days. The area of cellulitis in the left calf posteriorly has resolved. He is no longer having any pain. He tells me he is using his compression pumps at least once a day sometimes twice. 02/23/18; the patient finished his doxycycline and Amoxil last week. On Friday he noticed a small erythematous circle about the size of a quarter on the left lower leg just above his ankle. This rapidly expanded and he now has erythema on the lateral and posterior part of the thigh. This is bright red. Also has an area on the dorsal foot just above his toes and a tender area just below the left popliteal fossa. He came off his prophylactic penicillin injections at his own insistence one or 2 months ago. This is obviously deteriorated since then 03/02/18; patient is on doxycycline and Amoxil. Culture I did last week of the weeping area on the back of his left calf grew group B strep. I have therefore renewed the amoxicillin 500 3 times a day for a further week. He has not been systemically unwell. Still complaining of an area of discomfort right under his left popliteal fossa. There is no open wound on the right leg. He tells me that he is using his pumps twice a day on  most days 03/09/18; patient arrives in clinic today completing his amoxicillin today. The cellulitis on his left leg is better. Furthermore  he tells me that he had intramuscular penicillin shots that his primary care office today. However he also states that the wrap on his right leg fell down shortly after leaving clinic last week. He developed a large blister that was present when he came in for a nurse visit later in the week and then he developed intense discomfort around this area.He tells me he is using his compression pumps 03/16/18; the patient has completed his doxycycline. The infectious part of this/cellulitis in the left heel area left popliteal area is a lot better. He has 2 open areas on the right calf. Still areas on the left calf but this is a lot better as well. 03/24/18; the patient arrives complaining of pain in the left popliteal area again. He thinks some of this is wrap injury. He has no open area on the right leg and really no open area on the left calf either except for the popliteal area. He claims to be compliant with the compression pumps 03/31/18; I gave him doxycycline last week because of cellulitis in the left popliteal area. This is a lot better although the surface epithelium is denuded off and response to this. He arrives today with uncontrolled edema in the right calf area as well as a fingernail injury in the right lateral calf. There is only a few open areas on the left 04/06/18; I gave him amoxicillin doxycycline over the last 2 weeks that the amoxicillin should be completing currently. He is not complaining of any pain or systemic symptoms. The only open areas see has is on the right lateral lower leg paradoxically I cannot see anything on the left lower leg. He tells me he is using his compression pumps twice a day on most days. Silver alginate to the wounds that are open under 4 layer compression 04/13/18; he completed antibiotics and has no new complaints. Using  his compression pumps. Silver alginate that anything that's opened 04/20/18; he is using his compression pumps religiously. Silver alginate 4 layer compression anything that's opened. He comes in today with no open wounds on the left leg but 3 on the right including a new one posteriorly. He has 2 on the right lateral and one on the right posterior. He likes Unna boots on the right leg for reasons that aren't really clear we had the usual 4 layer compression on the left. It may be necessary to move to the 4 layer compression on the right however for now I left them in the Unna boots 04/27/18; he is using his compression pumps at least once a day. He has still the wounds on the right lateral calf. The area right posteriorly has closed. He does not have an open wound on the left under 4 layer compression however on the dorsal left foot just proximal to the toes and the left third toe 2 small open areas were identified 05/11/18; he has not uses compression pumps. The areas on the right lateral calf have coalesced into one large wound necrotic surface. On the left side he has one small wound anteriorly however the edema is now weeping out of a large part of his left leg. He says he wasn't using his pumps because of the weeping fluid. I explained to him that this is the time he needs to pump more 05/18/18; patient states he is using his compression pumps twice a day. The area on the right lateral large wound albeit superficial. On the left side he has innumerable number of  small new wounds on the left calf particularly laterally but several anteriorly and medially. All these appear to have healthy granulated base these look like the remnants of blisters however they occurred under compression. The patient arrives in clinic today with his legs somewhat better. There is certainly less edema, less multiple open areas on the left calf and the right anterior leg looks somewhat better as well superficial and a  little smaller. However he relates pain and erythema over the last 3-4 days in the thigh and I looked at this today. He has not been systemically unwell no fever no chills no change in blood sugar values 05/25/18; comes in today in a better state. The severe cellulitis on his left leg seems better with the Keflex. Not as tender. He has not been systemically unwell Hard to find an open wound on the left lower leg using his compression pumps twice a day The confluent wounds on his right lateral calf somewhat better looking. These will ultimately need debridement I didn't do this today. 06/01/18; the severe cellulitis on the left anterior thigh has resolved and he is completed his Keflex. There is no open wound on the left leg however there is a superficial excoriation at the base of the third toe dorsally. Skin on the bottom of his left foot is macerated looking. The left the wounds on the lateral right leg actually looks some better although he did require debridement of the top half of this wound area with an open curet 06/09/18 on evaluation today patient appears to be doing poorly in regard to his right lower extremity in particular this appears to likely be infected he has very thick purulent discharge along with a bright green tent to the discharge. This makes me concerned about the possibility of pseudomonas. He's also having increased discomfort at this point on evaluation. Fortunately there does not appear to be any evidence of infection spreading to the other location at this time. 06/16/18 on evaluation today patient appears to actually be doing fairly well. His ulcer has actually diminished in size quite significantly at this point which is good news. Nonetheless he still does have some evidence of infection he did see infectious disease this morning before coming here for his appointment. I did review the results of their evaluation and their note today. They did actually have him  discontinue the Cipro and initiate treatment with linezolid at this time. He is doing this for the next seven days and they recommended a follow-up in four months with them. He is the keep a log of the need for intermittent antibiotic therapy between now and when he falls back up with infectious disease. This will help them gaze what exactly they need to do to try and help them out. 06/23/18; the patient arrives today with no open wounds on the left leg and left third toe healed. He is been using his compression pumps twice a day. On the right lateral leg he still has a sizable wound but this is a lot better than last time I saw this. 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 Has been using silver alginate under 4 layer compression 06/30/18; the only open wound he has is on the right lateral leg and this looks healthy. No debridement is required. We have been using silver alginate. He does not have an open wound on the left leg. There  is apparently some drainage from the dorsal proximal third toe on the left although I see no open wound here. 07/03/18 on evaluation today patient was actually here just for a nurse visit rapid change. However when he was here on Wednesday for his rat change due to having been healed on the left and then developing blisters we initiated the wrap again knowing that he would be back today for us to reevaluate and see were at. Unfortunately he has developed some cellulitis into the proximal portion of his right lower extremity even into the region of his thigh. 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 He was placed on one as what at that point. Nonetheless he is done with that and has been tolerating it well otherwise. Doxycycline which in the past really did not seem to be effective for him. Nonetheless I think the best  option may be for us to definitely reinitiate the antibiotics for a longer period of time. 07/07/18; since I last saw this patient a week ago he has had a difficult time. At that point he did not have an open wound on his left leg. We transitioned him into juxta light stockings. He was apparently in the clinic the next day with blisters on the left lateral and left medial lower calf. He also had weeping edema fluid. He was put back into a compression wrap. He was also in the clinic on Friday with intense erythema in his right thigh. Per the patient he was started on Bactrim however that didn't work at all in terms of relieving his pain and swelling. He has taken 3 doxycycline that he had left over from last time and that seems to of helped. He has blistering on the right thigh as well. 07/14/18; the erythema on his right thigh has gotten better with doxycycline that he is finishing. 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 Presumably this cellulitis in the thigh was not related to that although I think this is a bit concerning going forward. He still has an area on the right lateral calf the blister on the right medial calf just below the knee that was discussed above. On the left 2 small open areas left medial and left lateral. Edema control is adequate. He is using his compression pumps twice a day 07/20/18; continued improvement in the condition of both legs especially the edema in his bilateral thighs. He tells me he is been losing weight through a combination of diet and exercise. He is using his compression pumps twice a day. So overall she made to the remaining wounds 07/27/2018; continued improvement in condition of both legs. His edema is well controlled. The area on the right lateral leg is just about closed he had one blisters show up on the medial left upper calf. We have him in 4 layer compression. He is going on  a 10-day trip to [place], [place] and [place]. He will be driving. He wants to wear Unna boots because of the lessening amount of constriction. He will not use compression pumps while he is away 08/05/18 on evaluation today patient actually appears to be doing decently well all things considered in regard to his bilateral lower extremities. The worst ulcer is actually only posterior aspect of his left lower extremity with a four layer compression wrap cut into his leg a couple weeks  back. He did have a trip and actually had [employer] for the trip that he is worn since he was last here. Nonetheless he feels like the [employer] actually do better for him his swelling is up a little bit but he also with his trip was not taking his Lasix on a regular set schedule like he was supposed to be. He states that obviously the reason being that he cannot drive and keep going without having to urinate too frequently which makes it difficult. He did not have his pumps with him while he was away either which I think also maybe playing a role here too. 08/13/2018; the patient only has a small open wound on the right lateral calf which is a big improvement in the last month or 2. He also has the area posteriorly just below the posterior fossa on the left which I think was a wrap injury from several weeks ago. He has no current evidence of cellulitis. He tells me he is back into his compression pumps twice a day. He also tells me that while he was at the [place] somebody stole a section of his extremitease stockings 08/20/2018; back in the clinic with a much improved state. He only has small areas on the right lateral mid calf which is just about healed. This was is more substantial area for quite a prolonged period of time. He has a small open area on the left anterior tibia. The area on the posterior calf just below the popliteal fossa is closed today. He is using his compression pumps twice a  day 08/28/2018; patient has no open wound on the right leg. He has a smattering of open areas on the calf with some weeping lymphedema. More problematically than that it looks as though his wraps of slipped down in his usual he has very angry upper area of edema just below the right medial knee and on the right lateral calf. He has no open area on his feet. The patient is traveling to [facility] next week. I will send him in an antibiotic. We will continue to wrap the right leg. We ordered extremitease stockings for him last week and I plan to transition the right leg to a stocking when he gets home which will be in 10 days time. As usual he is very reluctant to take his pumps with him when he travels 09/07/2018; patient returns from [facility]. He shows me a picture of his left leg in the mid part of his trip last week with intense fire engine erythema. The picture look bad enough I would have considered sending him to the hospital. Instead he went to the wound care center in [facility]. They did not prescribe him antibiotics but he did take some doxycycline he had leftover from a previous visit. I had given him trimethoprim sulfamethoxazole before he left this did not work according to the patient. This is resulted in some improvement fortunately. He comes back with a large wound on the left posterior calf. Smaller area on the left anterior tibia. Denuded blisters on the dorsal left foot over his toes. Does not have much in the way of wounds on the right leg although he does have a very tender area on the right posterior area just below the popliteal fossa also suggestive of infection. He promises me he is back on his pumps twice a day 09/15/2018; the intense cellulitis in his left lower calf is a lot better.  The wound area on the posterior left calf is also so better. However he has reasonably extensive wounds on the dorsal aspect of his second and third toes and the proximal  foot just at the base of the toes. There is nothing open on the right leg 09/22/2018; the patient has excellent edema control in his legs bilaterally. He is using his external compression pumps twice a day. He has no open area on the right leg and only the areas in the left foot dorsally second and third toe area on the left side. He does not have any signs of active cellulitis. 10/06/2018; the patient has good edema control bilaterally. He has no open wound on the right leg. There is a blister in the posterior aspect of his left calf that we had to deal with today. He is using his compression pumps twice a day. There is no signs of active cellulitis. We have been using silver alginate to the wound areas. He still has vulnerable areas on the base of his left first second toes dorsally He has a his extremities stockings and we are going to transition him today into the stocking on the right leg. He is cautioned that he will need to continue to use the compression pumps twice a day. If he notices uncontrolled edema in the right leg he may need to go to 3 times a day. 10/13/2018; the patient came in for a nurse check on Friday he has a large flaccid blister on the right medial calf just below the knee. We unroofed this. He has this and a new area underneath the posterior mid calf which was undoubtedly a blister as well. He also has several small areas on the right which is the area we put his extremities stocking on. 10/19/2018; the patient went to see infectious disease this morning I am not sure if that was a routine follow-up in any case the doxycycline I had given him was discontinued and started on linezolid. He has not started this. It is easy to look at his left calf and the inflammation and think this is cellulitis however he is very tender in the tissue just below the popliteal fossa and I have no doubt that there is infection going on here. He states the problem he is having is that with the  compression pumps the edema goes down and then starts walking the wrap falls down. We will see if we can adhere this. He has 1 or 2 minuscule open areas on the right still areas that are weeping on the posterior left calf, the base of his left second and third toes 10/26/18; back today in clinic with quite of skin breakdown in his left anterior leg. This may have been infection the area below the popliteal fossa seems a lot better however tremendous epithelial loss on the left anterior mid tibia area over quite inexpensive tissue. He has 2 blisters on the right side but no other open wound here. 10/29/2018; came in urgently to see [place] today and we worked him in for review. He states that the 4 layer compression on the right leg caused pain he had to cut it down to roughly his mid calf this caused swelling above the wrap and he has blisters and skin breakdown today. As a result of the pain he has not been using his pumps. Both legs are a lot more edematous and there is a lot of weeping fluid. 11/02/18; arrives in clinic with continued difficulties in  the right leg> left. Leg is swollen and painful. multiple skin blisters and new open areas especially laterally. He has not been using his pumps on the right leg. 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 He is not systemically unwell. 11/09/2018; the patient claims he is being compliant with his pumps. He is finished the doxycycline I gave him last week. Culture I did of the wound on the right lateral leg showed a few very resistant methicillin staph aureus. This was resistant to doxycycline. Nevertheless he states the pain in the leg is a lot better which makes me wonder if the cultured organism was not really what was causing the problem nevertheless this is a very dangerous organism to be culturing out of any wound. His right leg is still a lot larger than the left. He is using an [employer] boot on this area,  he blames a 4-layer compression for causing the original skin breakdown which I doubt is true however I cannot talk him out of it. We have been using silver alginate to all of these areas which were initially blisters 11/16/2018; patient is being compliant with his external compression pumps at twice a day. Miraculously he arrives in clinic today with absolutely no open wounds. He has better edema control on the left where he has been using 4 layer compression versus wound of wounds on the right and I pointed this out to him. There is no inflammation in the skin in his lower legs which is also somewhat unusual for him. There is no open wounds on the dorsal left foot. He has extremitease stockings at home and I have asked him to bring these in next week. 11/25/18 patient's lower extremity on examination today on the left appears for the most part to be wound free. He does have an open wound on the lateral aspect of the right lower extremity but this is minimal compared to what I've seen in past. He does request that we go ahead and wrap the left leg as well even though there's nothing open just so hopefully it will not reopen in short order. 1/28; patient has superficial open wounds on the right lateral calf left anterior calf and left posterior calf. His edema control is adequate. 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 We have been using silver alginate as the primary dressing. He claims compliance with his compression pumps 2/4; patient has superficial open wounds on numerous areas of his left calf and again one on the left dorsal foot. The areas on the right lateral calf have healed. The cellulitis that I gave him doxycycline for last week is also resolved this was mostly on the left anterior calf just below the tibial tuberosity. His edema looks fairly well-controlled. He tells me he went to see his primary  doctor today and had blood work ordered 2/11; once again he has several open areas on the left calf left tibial area. Most of these are small and appear to have healthy granulation. He does not have anything open on the right. The edema and control in his thighs is pretty good which is usually a good indication he has been using his pumps as requested. 2/18; he continues to have several small areas on the left calf and left tibial area. Most of these are small healthy granulation. We put him in his stocking on the right  leg last week and he arrives with a superficial open area over the right upper tibia and a fairly large area on the right lateral tibia in similar condition. His edema control actually does not look too bad, he claims to be using his compression pumps twice a day 2/25. Continued small areas on the left calf and left tibial area. New areas especially on the right are identified just below the tibial tuberosity and on the right upper tibia itself. There are also areas of weeping edema fluid even without an obvious wound. He does not have a considerable degree of lymphedema but clearly there is more edema here than his skin can handle. He states he is using the pumps twice a day. We have an Unna boot on the right and 4 layer compression on the left. 3/3; he continues to have an area on the right lateral calf and right posterior calf just below the popliteal fossa. There is a fair amount of tenderness around the wound on the popliteal fossa but I did not see any evidence of cellulitis, could just be that the wrap came down and rubbed in this area. He does not have an open area on the left leg however there is an area on the left dorsal foot at the base of the third toe We have been using silver alginate to all wound areas 3/10; he did not have an open area on his left leg last time he was here a week ago. Today he arrives with a horizontal wound just below the tibial tuberosity and an  area on the left lateral calf. He has intense erythema and tenderness in this area. The area is on the right lateral calf and right posterior calf better than last week. We have been using silver alginate as usual 3/18 - Patient returns with 3 small open areas on left calf, and 1 small open area on right calf, the skin looks ok with no significant erythema, he continues the UNA boot on right and 4 layer compression on left. The right lateral calf wound is closed , the right posterior is small area. we will continue silver alginate to the areas. 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 He is been tolerating the dressing changes without complication. Fortunately she has made excellent improvement in regard to the overall status of his wounds. Unfortunately every time we cease wrapping him he ends up reopening in causing more significant issues at that point. Again I'm unsure of the best direction to take although I think the lymphedema clinic may be appropriate for him. 02/03/19 on evaluation today patient appears to be doing well in regard to the wounds that we saw him for last week unfortunately he has a new area on the proximal portion of his right medial/posterior lower extremity where the wrap somewhat slowed down and caused swelling and a blister to rub and open. Unfortunately this is the only opening that he has on either leg at this point. 02/17/19 on evaluation today patient's bilateral lower extremities appear to be doing well. He still completely healed in regard to the left lower extremity. In regard to the right lower extremity the area where the wrap and slid down and caused the blister still seems to be slightly open although this is dramatically better than during the  last evaluation two  weeks ago. I'm very pleased with the way this stands overall. 03/03/19 on evaluation today patient appears to be doing well in regard to his right lower extremity in general although he did have a new blister open this does not appear to be showing any evidence of active infection at this time. Fortunately there's No fevers, chills, nausea, or vomiting noted at this time. Overall I feel like he is making good progress it does feel like that the right leg will we perform the [employer] seems to do with a bit better than three layer wrap on the left which slid down on him. We may switch to doing bilateral in the book wraps. 5/4; I have not seen Mr. Metter in quite some time. According to our case manager he did not have an open wound on his left leg last week. He had 1 remaining wound on the right posterior medial calf. He arrives today with multiple openings on the left leg probably were blisters and/or wrap injuries from Unna boots. I do not think the Unna boot's will provide adequate compression on the left. I am also not clear about the frequency he is using the compression pumps. 03/17/19 on evaluation today patient appears to be doing excellent in regard to his lower extremities compared to last week's evaluation apparently. He had gotten significantly worse last week which is unfortunate. The [employer] wrap on the left did not seem to do very well for him at all and in fact it didn't control his swelling significantly enough he had an additional outbreak. Subsequently we go back to the four layer compression wrap on the left. This is good news. At least in that he is doing better and the wound seem to be killing him. He still has not heard anything from the lymphedema clinic. 03/24/19 on evaluation today patient actually appears to be doing much better in regard to his bilateral lower Trinity as compared to last week when I saw him. Fortunately there's no signs of active infection at this time. He  has been tolerating the dressing changes without complication. Overall I'm extremely pleased with the progress and appearance in general. 04/07/19 on evaluation today patient appears to be doing well in regard to his bilateral lower extremities. His swelling is significantly down from where it was previous. With that being said he does have a couple blisters still open at this point but fortunately nothing that seems to be too severe and again the majority of the larger openings has healed at this time. 04/14/19 on evaluation today patient actually appears to be doing quite well in regard to his bilateral lower extremities in fact I'm not even sure there's anything significantly open at this time at any site. Nonetheless he did have some trouble with these wraps where they are somewhat irritating him secondary to the fact that he has noted that the graph wasn't too close down to the end of this foot in a little bit short as well up to his knee. Otherwise things seem to be doing quite well. 04/21/19 upon evaluation today patient's wound bed actually showed evidence of being completely healed in regard to both lower extremities which is excellent news. There does not appear to be any signs of active infection which is also good news. I'm very pleased in this regard. No fevers, chills, nausea, or vomiting noted at this time. 04/28/19 on evaluation today patient appears to be doing a little bit worse in regard  to both lower extremities on the left mainly due to the fact that when he went infection disease the wrap was not wrapped quite high enough he developed a blister above this. On the right he is a small open area of nothing too significant but again this is continuing to give him some trouble he has been were in the Velcro compression that he has at home. 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 He's been tolerating the dressing changes  without complication. Fortunately there's no signs of active infection at this time. No fevers, chills, nausea, or vomiting noted at this time. We have been trying to get an appointment with her lymphedema clinic in [facility] but unfortunately nobody can get them on phone with not been able to even fax information over the patient likewise is not been able to get in touch with them. Overall I'm not sure exactly what's going on here with to reach out again today. 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 Still having a lot of drainage unfortunately. He tells me especially in the left but even on the right. There's no signs of active infection which is good news we've been using so ratcheted up to this point. 05/19/19 on evaluation today patient actually appears to be doing quite well with regard to his left lower extremity which is great news. Fortunately in regard to the right lower extremity has an issues with his wrap and he subsequently did remove this from what I'm understanding. Nonetheless long story short is what he had rewrapped once he removed it subsequently had maggots underneath this wrap whenever he came in for evaluation today. With that being said they were obviously completely cleaned away by the nursing staff. The visit today which is excellent news. However he does appear to potentially have some infection around the right ankle region where the maggots were located as well. He will likely require anabiotic therapy today. 05/26/19 on evaluation today patient actually appears to be doing much better in regard to his bilateral lower extremities. I feel like the infection is under much better control. With that being said there were maggots noted when the wrap was removed yet again today. Again this could have potentially been left over from previous although at this time there does  not appear to be any signs of significant drainage there was obviously on the wrap some drainage as well this contracted gnats or otherwise. Either way I do not see anything that appears to be doing worse in my pinion and in fact I think his drainage has slowed down quite significantly likely mainly due to the fact to his infection being under better control. 06/02/2019 on evaluation today patient actually appears to be doing well with regard to his bilateral lower extremities there is no signs of active infection at this time which is great news. With that being said he does have several open areas more so on the right than the left but nonetheless these are all significantly better than previously noted. 06/09/2019 on evaluation today patient actually appears to be doing well. His wrap stayed up and he did not cause any problems he had more drainage on the right compared to the left but overall I do not see any major issues at this time which is great news. 06/16/2019 on evaluation today patient appears to be  doing excellent with regard to his lower extremities the only area that is open is a new blister that can have opened as of today on the medial ankle on the left. Other than this he really seems to be doing great I see no major issues at this point. 06/23/2019 on evaluation today patient appears to be doing quite well with regard to his bilateral lower extremities. In fact he actually appears to be almost completely healed there is a small area of weeping noted of the right lower extremity just above the ankle. Nonetheless fortunately there is no signs of active infection at this time which is good news. No fevers, chills, nausea, vomiting, or diarrhea. 8/24; the patient arrived for a nurse visit today but complained of very significant pain in the left leg and therefore I was asked to look at this. Noted that he did not have an open area on the left leg last week nevertheless this was wrapped.  The patient states that he is not been able to put his compression pumps on the left leg because of the discomfort. He has not been systemically unwell 06/30/2019 on evaluation today patient unfortunately despite being excellent last week is doing much worse with regard to his left lower extremity today. In fact he had to come in for a nurse on Monday where his left leg had to be rewrapped due to excessive weeping Dr. Leanord Hawking placed him on doxycycline at that point. Fortunately there is no signs of active infection Systemically at this time which is good news. 07/07/2019 in regard to the patient's wounds today he actually seems to be doing well with his right lower extremity there really is nothing open or draining at this point this is great news. Unfortunately the left lower extremity is given him additional trouble at this time. There does not appear to be any signs of active infection nonetheless he does have a lot of edema and swelling noted at this point as well as blistering all of which has led to a much more poor appearing leg at this time compared to where it was 2 weeks ago when it was almost completely healed. Obviously this is a little discouraging for the patient. He is try to contact the lymphedema clinic in [place] he has not been able to get through to them. 07/14/2019 on evaluation today patient actually appears to be doing slightly better with regard to his left lower extremity ulcers. Overall I do feel like at least at the top of the wrap that we have been placing this area has healed quite nicely and looks much better. The remainder of the leg is showing signs of improvement. Unfortunately in the thigh area he still has an open region on the left and again on the right he has been utilizing just a Band-Aid on an area that also opened on the thigh. Again this is an area that were not able to wrap although we did do an Ace wrap to provide some compression that something that  obviously is a little less effective than the compression wraps we have been using on the lower portion of the leg. He does have an appointment with the lymphedema clinic in [facility] on Friday. 07/21/2019 on evaluation today patient appears to be doing better with regard to his lower extremity ulcers. He has been tolerating the dressing changes without complication. Fortunately there is no signs of active infection at this time. No fevers, chills, nausea, vomiting, or diarrhea. I did receive  the paperwork from the physical therapist at the lymphedema clinic in [place]. Subsequently I signed off on that this morning and sent that back to him for further progression with the treatment plan. 07/28/2019 on evaluation today patient appears to be doing very well with regard to his right lower extremity where I do not see any open wounds at this point. Fortunately he is feeling great as far as that is concerned as well. In regard to the left lower extremity he has been having issues with still several areas of weeping and edema although the upper leg is doing better his lower leg still I think is going require the compression wrap at this time. No fevers, chills, nausea, vomiting, or diarrhea. 08/04/2019 on evaluation today patient unfortunately is having new wounds on the right lower extremity. Again we have been using Unna boot wrap on that side. We switched him to using his juxta lite wrap at home. With that being said he tells me he has been using it although his legs extremely swollen and to be honest really does not appear that he has been. I cannot know that for sure however. Nonetheless he has multiple new wounds on the right lower extremity at this time. Obviously we will have to see about getting this rewrapped for him today. 08/11/2019 on evaluation today patient appears to be doing fairly well with regard to his wounds. He has been tolerating the dressing changes including the  compression wraps without complication. He still has a lot of edema in his upper thigh regions bilaterally he is supposed to be seeing the lymphedema clinic on the 15th of this month once his wraps arrive for the upper part of his legs. 08/18/2019 on evaluation today patient appears to be doing well with regard to his bilateral lower extremities at this point. He has been tolerating the dressing changes without complication. Fortunately there is no signs of active infection which is also good news. He does have a couple weeping areas on the first and second toe of the right foot he also has just a small area on the left foot upper leg and a small area on the left lower leg but overall he is doing quite well in my opinion. He is supposed to be getting his wraps shortly in fact tomorrow and then subsequently is seeing the lymphedema clinic next Wednesday on the 21st. Of note he is also leaving on the 25th to go on vacation for a week to the beach. For that reason and since there is some uncertainty about what there can be doing at lymphedema clinic next Wednesday I am get a make an appointment for next Friday here for [place] to see what we need to do for him prior to him leaving for vacation. 10/23; patient arrives in considerable pain predominantly in the upper posterior calf just distal to the popliteal fossa also in the wound anteriorly above the major wound. This is probably cellulitis and he has had this recurrently in the past. He has no open wound on the right side and he has had an Radio broadcast assistant in that area. Finally I note that he has an area on the left posterior calf which by enlarge is mostly epithelialized. This protrudes beyond the borders of the surrounding skin in the setting of dry scaly skin and lymphedema. The patient is leaving for [facility] on Sunday. Per his longstanding pattern, he will not take his compression pumps with him predominantly out of fear that they  will be stolen. He  therefore asked that we put a Unna boot back on the right leg. He will also contact the wound care center in [facility] to see if they can change his dressing in the mid week. 11/3; patient returned from his vacation to [facility]. He was seen on 1 occasion at their wound care center. They did a 2 layer compression system as they did not have our 4-layer wrap. I am not completely certain what they put on the wounds. They did not change the Unna boot on the right. The patient is also seeing a lymphedema specialist physical therapist in [place]. It appears that he has some compression sleeve for his thighs which indeed look quite a bit better than I am used to seeing. He pumps over these with his external compression pumps. 11/10; the patient has a new wound on the right medial thigh otherwise there is no open areas on the right. He has an area on the left leg posteriorly anteriorly and medially and an area over the left second toe. We have been using silver alginate. He thinks the injury on his thigh is secondary to friction from the compression sleeve he has. 11/17; the patient has a new wound on the right medial thigh last week. He thinks this is because he did not have a underlying stocking for his thigh juxta lite apparatus. He now has this. The area is fairly large and somewhat angry but I do not think he has underlying cellulitis. He has a intact blister on the right anterior tibial area. Small wound on the right great toe dorsally Small area on the medial left calf. 11/30; the patient does not have any open areas on his right leg and we did not take his juxta lite stocking off. However he states that on Friday his compression wrap fell down lodging around his upper mid calf area. As usual this creates a lot of problems for him. He called urgently today to be seen for a nurse visit however the nurse visit turned into a provider visit because of extreme erythema and pain in the  left anterior tibia extending laterally and posteriorly. The area that is problematic is extensive 10/06/2019 upon evaluation today patient actually appears to be doing poorly in regard to his left lower extremity. He Dr. Leanord Hawking did place him on doxycycline this past Monday apparently due to the fact that he was doing much worse in regard to this left leg. Fortunately the doxycycline does seem to be helping. Unfortunately we are still having a very difficult time getting his edema under any type of control in order to anticipate discharge at some point. The only way were really able to control his lymphedema really is with compression wraps and that has only even seemingly temporary. He has been seeing a lymphedema clinic they are trying to help in this regard but still this has been somewhat frustrating in general for the patient. 10/13/19 on evaluation today patient appears to be doing excellent with regard to his right lower extremity as far as the wounds are concerned. His swelling is still quite extensive unfortunately. He is still having a lot of drainage from the thigh areas bilaterally which is unfortunate. He's been going to lymphedema clinic but again he still really does not have this edema under control as far as his lower extremities are concern. With regard to his left lower extremity this seems to be improving and I do believe the doxycycline has been  of benefit for him. He is about to complete the doxycycline. 10/20/2019 on evaluation today patient appears to be doing poorly in regard to his bilateral lower extremities. More in the right thigh he has a lot of irritation at this site unfortunately. In regard to the left lower extremity the wrap was not quite as high it appears and does seem to have caused him some trouble as well. Fortunately there is no evidence of systemic infection though he does have some blue-green drainage which has me concerned for the possibility of  Pseudomonas. He tells me he is previously taking Cipro without complications and he really does not care for Levaquin however due to some of the side effects he has. He is not allergic to any medications specifically antibiotics that were aware of. Electronic Signature(s) Signed: 10/20/2019 11:24:55 AM By: Kevin Kelp PA-C Entered By: [name] on 10/20/2019 11:24:54 -------------------------------------------------------------------------------- Physical Exam Details Patient Name: Date of Service: [name], [name] 10/20/2019 10:30 AM Medical Record [id number] Patient Account Number: [id number] Date of Birth/Sex: Treating RN: 25-Aug-1951 (68 y.o. [name] Primary Care Provider: Nicoletta [name] Other Clinician: Referring Provider: Treating Provider/Extender:[name], [name], [name] in Treatment: 195 Constitutional Obese and well-hydrated in no acute distress. Respiratory normal breathing without difficulty. clear to auscultation bilaterally. Cardiovascular regular rate and rhythm with normal S1, S2. Psychiatric this patient is able to make decisions and demonstrates good insight into disease process. Alert and Oriented x 3. pleasant and cooperative. Notes Upon inspection today patient's legs do not seem to be doing very well at all. I am concerned about the fact that he has blue-green drainage consistent with Pseudomonas noted on his dressings. He also in general seems to be doing worse not better. I do believe that we want to go ahead and likely rewrap him but I am also going to consider put him on an antibiotic, specifically Cipro, at this point. Electronic Signature(s) Signed: 10/20/2019 11:25:32 AM By: Kevin Kelp PA-C Entered By: [name] on 10/20/2019 11:25:32 -------------------------------------------------------------------------------- Physician Orders Details Patient Name: Date of Service: [name], [name] 10/20/2019  10:30 AM Medical Record [id number] Patient Account Number: [id number] Date of Birth/Sex: Treating RN: Mar 23, 1951 (68 y.o. 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 Dressing Change Frequency Do not change entire dressing for one week. - both legs Wound #145 Right,Medial Upper Leg Change dressing every day. Wound #149 Left,Medial Upper Leg Change dressing every day. Skin Barriers/Peri-Wound Care TCA Cream or Ointment - mix with lotion both legs Wound #145 Right,Medial Upper Leg Barrier cream - 50/50 mix with Triamcinolone TCA Cream or Ointment - 50/50 mix with barrier cream Wound Cleansing May shower with protection. Primary Wound Dressing Wound #145 Right,Medial Upper Leg Other: - Xtrasorb (use Zetuvit in clinic) Wound #149 Left,Medial Upper Leg Other: - Xtrasorb (use Zetuvit in clinic) Wound #151 Left,Medial Lower Leg Calcium Alginate with Silver Wound #152 Right Toe  Second Calcium Alginate with Silver Secondary Dressing Wound #145 Right,Medial Upper Leg ABD pad - secure with tape Wound #149 Left,Medial Upper Leg ABD pad - secured with tape Wound #151 Left,Medial Lower Leg ABD pad Wound #152 Right Toe Second Kerlix/Rolled Gauze Drawtex Edema  Control 4 layer compression: Left lower extremity - unna boot at upper portion of lower leg. Unna Boot to Right Lower Extremity Avoid standing for long periods of time Elevate legs to the level of the heart or above for 30 minutes daily and/or when sitting, a frequency of: - throughout the day Exercise regularly Segmental Compressive Device. - lymphadema pumps 60 min 2 times per day Patient Medications Allergies: No Known Allergies Notifications Medication Indication Start End Cipro 10/20/2019 DOSE 1 - oral 500 mg tablet - 1 tablet oral taken 2 times a day for 14 days triamcinolone acetonide 10/20/2019 DOSE 0 - topical 0.1 % cream - cream topical applied to the right thigh region as directed in the clinic with each dressing change Electronic Signature(s) Signed: 10/20/2019 11:29:03 AM By: Kevin Kelp PA-C Previous Signature: 10/20/2019 11:27:11 AM Version By: Kevin Kelp PA-C Entered By: [name] on 10/20/2019 11:28:52 -------------------------------------------------------------------------------- Problem List Details Patient Name: Date of Service: [name] 10/20/2019 10:30 AM Medical Record [id number] Patient Account Number: [id number] Date of Birth/Sex: Treating RN: [birth year] (68 y.o. [name] Primary Care Provider: Nicoletta [name] Other Clinician: Referring Provider: Treating Provider/Extender:[name], [name], [name] in Treatment: 195 Active Problems ICD-10 Evaluated Encounter Code Description Active Date Today Diagnosis L97.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 right calf limited to 06/30/2018 No Yes breakdown of skin L97.221 Non-pressure chronic ulcer of left calf limited to 09/30/2016 No Yes breakdown of skin I87.333 Chronic venous hypertension (idiopathic) with ulcer 01/22/2016 No Yes and inflammation of bilateral lower extremity I89.0 Lymphedema, not elsewhere classified 01/22/2016 No Yes E11.622 Type 2 diabetes  mellitus with other skin ulcer 01/22/2016 No Yes 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, 01/22/2016 No Yes unspecified L03.116 Cellulitis of left lower limb 04/01/2017 No Yes Inactive Problems ICD-10 Code Description Active Date Inactive Date 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 06/30/2017 skin L97.521 Non-pressure chronic ulcer of other part of left foot limited to 04/27/2018 04/27/2018 breakdown of skin L03.115 Cellulitis of right lower limb 12/22/2017 12/22/2017 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 with other specified 06/30/2018 06/30/2018 severity L97.511 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 06/30/2018 06/30/2018 breakdown of skin Resolved Problems Electronic Signature(s) Signed: 10/20/2019 11:09:43 AM By: Kevin Kelp PA-C Entered By: [name] on 10/20/2019 11:09:42 -------------------------------------------------------------------------------- Progress Note/History and Physical Details Patient Name: Date of Service: [name], [name] 10/20/2019 10:30 AM Medical Record [id number] Patient Account Number: [id number] Date of Birth/Sex: Treating RN: 01-Oct-1951 (68 y.o. [name] Primary Care Provider: Nicoletta [name] Other Clinician: Referring Provider: Treating Provider/Extender:[name], [name], [name] in Treatment: 195 Subjective Chief Complaint Information obtained from Patient patient is here for evaluation venous/lymphedema weeping History of Present Illness (HPI) Referred by PCP for consultation. Patient has long standing history of BLE venous stasis, no prior ulcerations. At beginning of month, developed cellulitis and weeping. Received IM Rocephin followed by Keflex and resolved. Wears compression stocking, appr 6 months old. Not sure strength. No present drainage. 01/22/16 this is a patient who is a type II diabetic on insulin. He also has  severe chronic bilateral venous insufficiency and inflammation. He tells me he  religiously wears pressure stockings of uncertain strength. He was here with weeping edema about 8 months ago but did not have an open wound. Roughly a month ago he had a reopening on his bilateral legs. He is been using bandages and Neosporin. He does not complain of pain. 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 Last BUN/creatinine I have is from 11/20/15 at 13 and 1.0 respectively 01/29/16; patient arrives today having tolerated the Profore wrap. He brought in his stockings and these are 18 mmHg stockings he bought from [place]. The compression here is likely inadequate. He does not complain of pain or excessive drainage she has no systemic symptoms. The wound on the right looks improved as does the one on the left although one on the left is more substantial with still tissue at risk below the actual wound area on the bilateral posterior calf 02/05/16; patient arrives with poor edema control. He states that we did put a 4 layer compression on it last week. No weight appear 5 this. 02/12/16; the area on the posterior right Has healed. The left Has a substantial wound that has necrotic surface eschar that requires a debridement with a curette. 02/16/16;the patient called or a Nurse visit secondary to increased swelling. He had been in earlier in the week with his right leg healed. He was transitioned to is on pressure stocking on the right leg with the only open wound on the left, a substantial area on the left posterior calf. Note he has a history of severe lower extremity edema, he has a history of chronic atrial fibrillation but not heart failure per my notes but I'll need to research this. He is not complaining of chest pain shortness of breath or orthopnea. The intake nurse noted blisters on the  previously closed right leg 02/19/16; this is the patient's regular visit day. I see him on Friday with escalating edema new wounds on the right leg and clear signs of at least right ventricular heart failure. I increased his Lasix to 40 twice a day. He is returning currently in follow-up. States he is noticed a decrease in that the edema 02/26/16 patient's legs have much less edema. There is nothing really open on the right leg. The left leg has improved condition of the large superficial wound on the posterior left leg 03/04/16; edema control is very much better. The patient's right leg wounds have healed. On the left leg he continues to have severe venous inflammation on the posterior aspect of the left leg. There is no tenderness and I don't think any of this is cellulitis. 03/11/16; patient's right leg is married healed and he is in his own stocking. The patient's left leg has deteriorated somewhat. There is a lot of erythema around the wound on the posterior left leg. There is also a significant rim of erythema posteriorly just above where the wrap would've ended there is a new wound in this location and a lot of tenderness. Can't rule out cellulitis in this area. 03/15/16; patient's right leg remains healed and he is in his own stocking. The patient's left leg is much better than last review. His major wound on the posterior aspect of his left Is almost fully epithelialized. He has 3 small injuries from the wraps. Really. Erythema seems a lot better on antibiotics 03/18/16; right leg remains healed and he is in his own stocking.  The patient's left leg is much better. The area on the posterior aspect of the left calf is fully epithelialized. His 3 small injuries which were wrap injuries on the left are improved only one seems still open his erythema has resolved 03/25/16; patient's right leg remains healed and he is in his own stocking. There is no open area today on the left leg posterior leg is  completely closed up. His wrap injuries at the superior aspect of his leg are also resolved. He looks as though he has some irritation on the dorsal ankle but this is fully epithelialized without evidence of infection. 03/28/16; we discharged this patient on Monday. Transitioned him into his own stocking. There were problems almost immediately with uncontrolled swelling weeping edema multiple some of which have opened. He does not feel systemically unwell in particular no chest pain no shortness of breath and he does not feel 04/08/16; the edema is under better control with the Profore light wrap but he still has pitting edema. There is one large wound anteriorly 2 on the medial aspect of his left leg and 3 small areas on the superior posterior calf. Drainage is not excessive he is tolerating a Profore light well 04/15/16; put a Profore wrap on him last week. This is controlled is edema however he had a lot of pain on his left anterior foot most of his wounds are healed 04/22/16 once again the patient has denuded areas on the left anterior foot which he states are because his wrap slips up word. He saw his primary physician today is on Lasix 40 twice a day and states that he his weight is down 20 pounds over the last 3 months. 04/29/16: Much improved. left anterior foot much improved. He is now on Lasix 80 mg per day. Much improved edema control 05/06/16; I was hoping to be able to discharge him today however once again he has blisters at a low level of where the compression was placed last week mostly on his left lateral but also his left medial leg and a small area on the anterior part of the left foot. 05/09/16; apparently the patient went home after his appointment on 7/4 later in the evening developing pain in his upper medial thigh together with subjective fever and chills although his temperature was not taken. The pain was so intense he felt he would probably have to call 911. However he then  remembered that he had leftover doxycycline from a previous round of antibiotics and took these. By the next morning he felt a lot better. He called and spoke to one of our nurses and I approved doxycycline over the phone thinking that this was in relation to the wounds we had previously seen although they were definitely were not. The patient feels a lot better old fever no chills he is still working. Blood sugars are reasonably controlled 05/13/16; patient is back in for review of his cellulitis on his anterior medial upper thigh. He is taking doxycycline this is a lot better. Culture I did of the nodular area on the dorsal aspect of his foot grew MRSA this also looks a lot better. 05/20/16; the patient is cellulitis on the medial upper thigh has resolved. All of his wound areas including the left anterior foot, areas on the medial aspect of the left calf and the lateral aspect of the calf at all resolved. He has a new blister on the left dorsal foot at the level of the fourth toe this  was excised. No evidence of infection 05/27/16; patient continues to complain weeping edema. He has new blisterlike wounds on the left anterior lateral and posterior lateral calf at the top of his wrap levels. The area on his left anterior foot appears better. He is not complaining of fever, pain or pruritus in his feet. 05/30/16; the patient's blisters on his left anterior leg posterior calf all look improved. He did not increase the Lasix 100 mg as I suggested because he was going to run out of his 40 mg tablets. He is still having weeping edema of his toes 06/03/16; I renewed his Lasix at 80 mg once a day as he was about to run out when I last saw him. He is on 80 mg of Lasix now. I have asked him to cut down on the excessive amount of water he was drinking and asked him to drink according to his thirst mechanisms 06/12/2016 -- was seen 2 days ago and was supposed to wear his compression stockings at home but he  is developed lymphedema and superficial blisters on the left lower extremity and hence came in for a review 06/24/16; the remaining wound is on his left anterior leg. He still has edema coming from between his toes. There is lymphedema here however his edema is generally better than when I last saw this. He has a history of atrial fibrillation but does not have a known history of congestive heart failure nevertheless I think he probably has this at least on a diastolic basis. 07/01/16 I reviewed his echocardiogram from January 2017. This was essentially normal. He did not have LVH, EF of 55-60%. His right ventricular function was normal although he did have trivial tricuspid and pulmonic regurgitation. This is not audible on exam however. I increased his Lasix to do massive edema in his legs well above his knees I think in early July. He was also drinking an excessive amount of water at the time. 07/15/16; missed his appointment last week because of the Labor Day holiday on Monday. He could not get another appointment later in the week. Started to feel the wrap digging in superiorly so we remove the top half and the bottom half of his wrap. He has extensive erythema and blistering superiorly in the left leg. Very tender. Very swollen. Edema in his foot with leaking edema fluid. He has not been systemically unwell 07/22/16; the area on the left leg laterally required some debridement. The medial wounds look more stable. His wrap injury wounds appear to have healed. Edema and his foot is better, weeping edema is also better. He tells me he is meeting with the supplier of the external compression pumps at work 08/05/16; the patient was on vacation last week in [facility]. His wrap is been on for an extended period of time. Also over the weekend he developed an extensive area of tender erythema across his anterior medial thigh. He took to doxycycline yesterday that he had leftover from a previous  prescription. The patient complains of weeping edema coming out of his toes 08/08/16; I saw this patient on 10/2. He was tender across his anterior thigh. I put him on doxycycline. He returns today in follow-up. He does not have any open wounds on his lower leg, he still has edema weeping into his toes. 08/12/16; patient was seen back urgently today to follow-up for his extensive left thigh cellulitis/erysipelas. He comes back with a lot less swelling and erythema pain is much better. I believe  I gave him Augmentin and Cipro. His wrap was cut down as he stated a roll down his legs. He developed blistering above the level of the wrap that remained. He has 2 open blisters and 1 intact. 08/19/16; patient is been doing his primary doctor who is increased his Lasix from 40-80 once a day or 80 already has less edema. Cellulitis has remained improved in the left thigh. 2 open areas on the posterior left calf 08/26/16; he returns today having new open blisters on the anterior part of his left leg. He has his compression pumps but is not yet been shown how to use some vital representative from the supplier. 09/02/16 patient returns today with no open wounds on the left leg. Some maceration in his plantar toes 09/10/2016 -- Dr. Leanord Hawking had recently discharged him on 09/02/2016 and he has come right back with redness swelling and some open ulcers on his left lower extremity. He says this was caused by trying to apply his compression stockings and he's been unable to use this and has not been able to use his lymphedema pumps. He had some doxycycline leftover and he has started on this a few days ago. 09/16/16; there are no open wounds on his leg on the left and no evidence of cellulitis. 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 He does not complain of symptoms here. I am not clear use using his external compression pumps. 09/23/16; I have not seen this  patient in 2 weeks. He canceled his appointment 10 days ago as he was going on vacation. He tells me that on Monday he noticed a large area on his posterior left leg which is been draining copiously and is reopened into a large wound. He is been using ABDs and the external part of his juxtalite, according to our nurse this was not on properly. 10/07/16; Still a substantial area on the posterior left leg. Using silver alginate 10/14/16; in general better although there is still open area which looks healthy. Still using silver alginate. He reminds me that this happen before he left for [facility]. Today while he was showering in the morning. He had been using his juxtalite's 10/21/16; the area on his posterior left leg is fully epithelialized. However he arrives today with a large area of tender erythema in his medial and posterior left thigh just above the knee. I have marked the area. Once again he is reluctant to consider hospitalization. I treated him with oral antibiotics in the past for a similar situation with resolution I think with doxycycline however this area it seems more extensive to me. He is not complaining of fever but does have chills and says states he is thirsty. His blood sugar today was in the 140s at home 10/25/16 the area on his posterior left leg is fully epithelialized although there is still some weeping edema. The large area of tenderness and erythema in his medial and posterior left thigh is a lot less tender although there is still a lot of swelling in this thigh. He states he feels a lot better. He is on doxycycline and Augmentin that I started last week. This will continued until Tuesday, December 26. I have ordered a duplex ultrasound of the left thigh rule out DVT whether there is an abscess something that would need to be drained I would also like to know. 11/01/16; he still has weeping edema from a not fully epithelialized area on  his left posterior calf. Most of  the rest of this looks a lot better. He has completed his antibiotics. His thigh is a lot better. Duplex ultrasound did not show a DVT in the thigh 11/08/16; he comes in today with more Denuded surface epithelium from the posterior aspect of his calf. There is no real evidence of cellulitis. The superior aspect of his wrap appears to have put quite an indentation in his leg just below the knee and this may have contributed. He does not complain of pain or fever. We have been using silver alginate as the primary dressing. The area of cellulitis in the right thigh has totally resolved. He has been using his compression stockings once a week 11/15/16; the patient arrives today with more loss of epithelium from the posterior aspect of his left calf. He now has a fairly substantial wound in this area. The reason behind this deterioration isn't exactly clear although his edema is not well controlled. He states he feels he is generally more swollen systemically. He is not complaining of chest pain shortness of breath fever. Tells me he has an appointment with his primary physician in early February. He is on 80 mg of oral Lasix a day. He claims compliance with the external compression pumps. He is not having any pain in his legs similar to what he has with his recurrent cellulitis 11/22/16; the patient arrives a follow-up of his large area on his left lateral calf. This looks somewhat better today. He came in earlier in the week for a dressing change since I saw him a week ago. He is not complaining of any pain no shortness of breath no chest pain 11/28/16; the patient arrives for follow-up of his large area on the left lateral calf this does not look better. In fact it is larger weeping edema. The surface of the wound does not look too bad. We have been using silver alginate although I'm not certain that this is a dressing issue. 12/05/16; again the patient follows up for a large wound on the left lateral and  left posterior calf this does not look better. There continues to be weeping edema necrotic surface tissue. More worrisome than this once again there is erythema below the wound involving the distal Achilles and heel suggestive of cellulitis. He is on his feet working most of the day of this is not going well. We are changing his dressing twice a week to facilitate the drainage. 12/12/16; not much change in the overall dimensions of the large area on the left posterior calf. This is very inflamed looking. I gave him an. Doxycycline last week does not really seem to have helped. He found the wrap very painful indeed it seems to of dog into his legs superiorly and perhaps around the heel. He came in early today because the drainage had soaked through his dressings. 12/19/16- patient arrives for follow-up evaluation of his left lower extremity ulcers. He states that he is using his lymphedema pumps once daily when there is "no drainage". He admits to not using his lipedema pumps while under current treatment. His blood sugars have been consistently between 150-200. 12/26/16; the patient is not using his compression pumps at home because of the wetness on his feet. I've advised him that I think it's important for him to use this daily. He finds his feet too wet, he can put a plastic bag over his legs while he is in the pumps. Otherwise I think will be  in a vicious circle. We are using silver alginate to the major area on his left posterior calf 01/02/17; the patient's posterior left leg has further of all into 3 open wounds. All of them covered with a necrotic surface. He claims to be using his compression pumps once a day. His edema control is marginal. Continue with silver alginate 01/10/17; the patient's left posterior leg actually looks somewhat better. There is less edema, less erythema. Still has 3 open areas covered with a necrotic surface requiring debridement. He claims to be using his compression  pumps once a day his edema control is better 01/17/17; the patient's left posterior calf look better last week when I saw him and his wrap was changed 2 days ago. He has noted increasing pain in the left heel and arrives today with much larger wounds extensive erythema extending down into the entire heel area especially tender medially. He is not systemically unwell CBGs have been controlled no fever. Our intake nurse showed me limegreen drainage on his AVD pads. 01/24/17; his usual this patient responds nicely to antibiotics last week giving him Levaquin for presumed Pseudomonas. The whole entire posterior part of his leg is much better much less inflamed and in the case of his Achilles heel area much less tender. He has also had some epithelialization posteriorly there are still open areas here and still draining but overall considerably better 01/31/17- He has continue to tolerate the compression wraps. he states that he continues to use the lymphedema pumps daily, and can increase to twice daily on the weekends. He is voicing no complaints or concerns regarding his LLE ulcers 02/07/17-he is here for follow-up evaluation. He states that he noted some erythema to the left medial and anterior thigh, which he states is new as of yesterday. He is concerned about recurrent cellulitis. He states his blood sugars have been slightly elevated, this morning in the 180s 02/14/17; he is here for follow-up evaluation. When he was last here there was erythema superiorly from his posterior wound in his anterior thigh. He was prescribed Levaquin however a culture of the wound surface grew MRSA over the phone I changed him to doxycycline on Monday and things seem to be a lot better. 02/24/17; patient missed his appointment on Friday therefore we changed his nurse visit into a physician visit today. Still using silver alginate on the large area of the posterior left thigh. He isn't new area on the dorsal left second  toe 03/03/17; actually better today although he admits he has not used his external compression pumps in the last 2 days or so because of work responsibilities over the weekend. 03/10/17; continued improvement. External compression pumps once a day almost all of his wounds have closed on the posterior left calf. Better edema control 03/17/17; in general improved. He still has 3 small open areas on the lateral aspect of his left leg however most of the area on the posterior part of his leg is epithelialized. He has better edema control. He has an ABD pad under his stocking on the right anterior lower leg although he did not let us look at that today. 03/24/17; patient arrives back in clinic today with no open areas however there are areas on the posterior left calf and anterior left calf that are less than 100% epithelialized. His edema is well controlled in the left lower leg. There is some pitting edema probably lymphedema in the left upper thigh. He uses compression pumps at home once per  day. I tried to get him to do this twice a day although he is very reticent. 04/01/2017 -- for the last 2 days he's had significant redness, tenderness and weeping and came in for an urgent visit today. 04/07/17; patient still has 6 more days of doxycycline. He was seen by Dr. Meyer Russel last Wednesday for cellulitis involving the posterior aspect, lateral aspect of his Involving his heel. For the most part he is better there is less erythema and less weeping. He has been on his feet for 12 hours o2 over the weekend. Using his compression pumps once a day 04/14/17 arrives today with continued improvement. Only one area on the posterior left calf that is not fully epithelialized. He has intense bilateral venous inflammation associated with his chronic venous insufficiency disease and secondary lymphedema. We have been using silver alginate to the left posterior calf wound In passing he tells [place] today that the right leg  but we have not seen in quite some time has an open area on it but he doesn't want [place] to look at this today states he will show this to [place] next week. 04/21/17; there is no open area on his left leg although he still reports some weeping edema. He showed [place] his right leg today which is the first time we've seen this leg in a long time. He has a large area of open wound on the right leg anteriorly healthy granulation. Quite a bit of swelling in the right leg and some degree of venous inflammation. He told us about the right leg in passing last week but states that deterioration in the right leg really only happened over the weekend 04/28/17; there is no open area on the left leg although there is an irritated part on the posterior which is like a wrap injury. The wound on the right leg which was new from last week at least to [place] is a lot better. 05/05/17; still no open area on the left leg. Patient is using his new compression stocking which seems to be doing a good job of controlling the edema. He states he is using his compression pumps once per day. The right leg still has an open wound although it is better in terms of surface area. Required debridement. A lot of pain in the posterior right Achilles marked tenderness. Usually this type of presentation this patient gives concern for an active cellulitis 05/12/17; patient arrives today with his major wound from last week on the right lateral leg somewhat better. Still requiring debridement. He was using his compression stocking on the left leg however that is reopened with superficial wounds anteriorly he did not have an open wound on this leg previously. He is still using his juxta light's once daily at night. He cannot find the time to do this in the morning as he has to be at work by 7 AM 05/19/17; right lateral leg wound looks improved. No debridement required. The concerning area is on the left posterior leg which appears to almost have a  subcutaneous hemorrhagic component to it. We've been using silver alginate to all the wounds 05/26/17; the right lateral leg wound continues to look improved. However the area on the left posterior calf is a tightly adherent surface. [place] using silver alginate. Because of the weeping edema in his legs there is very little good alternatives. 06/02/17; the patient left here last week looking quite good. Major wound on the left posterior calf and a small one on the  right lateral calf. Both of these look satisfactory. He tells me that by Wednesday he had noted increased pain in the left leg and drainage. He called on Thursday and Friday to get an appointment here but we were blocked. He did not go to urgent care or his primary physician. He thinks he had a fever on Thursday but did not actually take his temperature. He has not been using his compression pumps on the left leg because of pain. I advised him to go to the emergency room today for IV antibiotics for stents of left leg cellulitis but he has refused I have asked him to take 2 days off work to keep his leg elevated and he has refused this as well. In view of this I'm going to call him and Augmentin and doxycycline. 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 However after a detailed discussion regarding the risk benefits and alternatives he refuses to get admitted to the hospital. With no other recourse I will continue him on oral antibiotics as before and hopefully he'll have his infectious disease consultation this week. 06/16/2017 -- the patient was seen today by the nurse practitioner at infectious disease Ms. Dixon. Her review noted recurrent cellulitis of the lower extremity with tinea pedis of the  left foot and she has recommended clindamycin 150 mg daily for now and she may increase it to 300 mg daily to cover staph and Streptococcus. He has also been advise Lotrimin cream locally. she also had wise IV antibiotics for his condition if it flares up 06/23/17; patient arrives today with drainage bilaterally although the remaining wound on the left posterior calf after cleaning up today "highlighter yellow drainage" did not look too bad. Unfortunately he has had breakdown on the right anterior leg [previously this leg had not been open and he is using a black stocking] he went to see infectious disease and is been put on clindamycin 150 mg daily, I did not verify the dose although I'm not familiar with using clindamycin in this dosing range, perhaps for prophylaxisoo 06/27/17; I brought this patient back today to follow-up on the wound deterioration on the right lower leg together with surrounding cellulitis. I started him on doxycycline 4 days ago. This area looks better however he comes in today with intense cellulitis on the medial part of his left thigh. This is not have a wound in this area. Extremely tender. We've been using silver alginate to the wounds on the right lower leg left lower leg with bilateral 4 layer compression he is using his external compression pumps once a day 07/04/17; patient's left medial thigh cellulitis looks better. He has not been using his compression pumps as his insert said it was contraindicated with cellulitis. His right leg continues to make improvements all the wounds are still open. We only have one remaining wound on the left posterior calf. Using silver alginate to all open areas. He is on doxycycline which I started a week ago and should be finishing I gave him Augmentin after Thursday's visit for the severe cellulitis on the left medial thigh which fortunately looks better 07/14/17; the patient's left medial thigh cellulitis has resolved. The cellulitis  in his right lower calf on the right also looks  better. All of his wounds are stable to improved we've been using silver alginate he has completed the antibiotics I have given him. He has clindamycin 150 mg once a day prescribed by infectious disease for prophylaxis, I've advised him to start this now. We have been using bilateral Unna boots over silver alginate to the wound areas 07/21/17; the patient is been to see infectious disease who noted his recurrent problems with cellulitis. He was not able to tolerate prophylactic clindamycin therefore he is on amoxicillin 500 twice a day. He also had a second daily dose of Lasix added By Dr. Oneta Rack but he is not taking this. Nor is he being completely compliant with his compression pumps a especially not this week. He has 2 remaining wounds one on the right posterior lateral lower leg and one on the left posterior medial lower leg. 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 The patient has Unna boots bilaterally. 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 He has bilateral Unna boots for compression and silver alginate to his wounds. Arrives today with his legs looking as good as I have seen him in quite some time. Not surprisingly his wounds look better as well with improvement on the right lateral leg venous insufficiency wound and also the left medial leg. He is still using the compression pumps once a day 08/11/17; both legs appear to be doing better wounds on the right lateral and left medial legs look better. Skin on the right leg quite good. He is been using silver alginate as the primary dressing. I'm going to use Anasept gel calcium alginate and maintain all the secondary dressings 08/18/17; the patient continues to  actually do quite well. The area on his right lateral leg is just about closed the left medial also looks better although it is still moist in this area. His edema is well controlled we have been using Anasept gel with calcium alginate and the usual secondary dressings, 4 layer compression and once daily use of his compression pumps "always been able to manage 09/01/17; the patient continues to do reasonably well in spite of his trip to [place]. The area on the right lateral leg is epithelialized. Left is much better but still open. He has more edema and more chronic erythema on the left leg [venous inflammation] 09/08/17; he arrives today with no open wound on the right lateral leg and decently controlled edema. Unfortunately his left leg is not nearly as in his good situation as last week.he apparently had increasing edema starting on Saturday. He edema soaked through into his foot so used a plastic bag to walk around his home. The area on the medial right leg which was his open area is about the same however he has lost surface epithelium on the left lateral which is new and he has significant pain in the Achilles area of the left foot. He is already on amoxicillin chronically for prophylaxis of cellulitis in the left leg 09/15/17; he is completed a week of doxycycline and the cellulitis in the left posterior leg and Achilles area is as usual improved. He still has a lot of edema and fluid soaking through his dressings. There is no open wound on the right leg. He saw infectious disease NP today 09/22/17;As usual 1 we  transition him from our compression wraps to his stockings things did not go well. He has several small open areas on the right leg. He states this was caused by the compression wrap on his skin although he did not wear this with the stockings over them. He has several superficial areas on the left leg medially laterally posteriorly. He does not have any evidence of active  cellulitis especially involving the left Achilles The patient is traveling from [facility] Saturday going to [facility]. He states he isn't attempting to get an appointment with a heel objects wound center there to change his dressings. I am not completely certain whether this will work 10/06/17; the patient came in on Friday for a nurse visit and the nurse reported that his legs actually look quite good. He arrives in clinic today for his regular follow-up visit. He has a new wound on his left third toe over the PIP probably caused by friction with his footwear. He has small areas on the left leg and a very superficial but epithelialized area on the right anterior lateral lower leg. Other than that his legs look as good as I've seen him in quite some time. We have been using silver alginate Review of systems; no chest pain no shortness of breath other than this a 10 point review of systems negative 10/20/17; seen by Dr. Meyer Russel last week. He had taken some antibiotics [doxycycline] that he had left over. Dr. Meyer Russel thought he had candida infection and declined to give him further antibiotics. He has a small wound remaining on the right lateral leg several areas on the left leg including a larger area on the left posterior several left medial and anterior and a small wound on the left lateral. The area on the left dorsal third toe looks a lot better. ROS; Gen.; no fever, respiratory no cough no sputum Cardiac no chest pain other than this 10 point review of system is negative 10/30/17; patient arrives today having fallen in the bathtub 3 days ago. It took him a while to get up. He has pain and maceration in the wounds on his left leg which have deteriorated. He has not been using his pumps he also has some maceration on the right lateral leg. 11/03/17; patient continues to have weeping edema especially in the left leg. This saturates his dressings which were just put on on 12/27. As usual the  doxycycline seems to take care of the cellulitis on his lower leg. He is not complaining of fever, chills, or other systemic symptoms. He states his leg feels a lot better on the doxycycline I gave him empirically. He also apparently gets injections at his primary doctor's officeo Rocephin for cellulitis prophylaxis. I didn't ask him about his compression pump compliance today I think that's probably marginal. Arrives in the clinic with all of his dressings primary and secondary macerated full of fluid and he has bilateral edema 11/10/17; the patient's right leg looks some better although there is still a cluster of wounds on the right lateral. The left leg is inflamed with almost circumferential skin loss medially to laterally although we are still maintaining anteriorly. He does not have overt cellulitis there is a lot of drainage. He is not using compression pumps. We have been using silver alginate to the wound areas, there are not a lot of options here 11/17/17; the patient's right leg continues to be stable although there is still open wounds, better than last week. The inflammation in the  left leg is better. Still loss of surface layer epithelium especially posteriorly. There is no overt cellulitis in the amount of edema and his left leg is really quite good, tells me he is using his compression pumps once a day. 11/24/17; patient's right leg has a small superficial wound laterally this continues to improve. The inflammation in the left leg is still improving however we have continuous surface layer epithelial loss posteriorly. There is no overt cellulitis in the amount of edema in both legs is really quite good. He states he is using his compression pumps on the left leg once a day for 5 out of 7 days 12/01/17; very small superficial areas on the right lateral leg continue to improve. Edema control in both legs is better today. He has continued loss of surface epithelialization and left  posterior calf although I think this is better. We have been using silver alginate with large number of absorptive secondary dressings 4 layer on the left Unna boot on the right at his request. He tells me he is using his compression pumps once a day 12/08/17; he has no open area on the right leg is edema control is good here. ooOn the left leg however he has marked erythema and tenderness breakdown of skin. He has what appears to be a wrap injury just distal to the popliteal fossa. 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 There was swelling erythema and breakdown in the right leg. He has much worse skin breakdown in the left leg as well multiple open areas medially and posteriorly as well as laterally. He tells me he has been using his compression pumps but tells me he feels that the drainage out of his leg is worse when he uses a compression pumps. To be fair to him he is been saying this for a while however I don't know that I have really been listening to this. I wonder if the compression pumps are working properly 12/22/17;. Once again he arrives with severe erythema, weeping edema from the left greater than right leg. Noncompliance with compression pumps. New this visit he is complaining of pain on the lateral aspect of the right leg and the medial aspect of his right thigh. He apparently saw his cardiologist Dr. [name] who was ordered an echocardiogram area and I think this is a step in the right direction 12/25/17; started his doxycycline Monday night. There is still intense erythema of the right leg especially in the anterior thigh although there is less tenderness. The erythema around the wound on the right lateral  calf also is less tender. He still complaining of pain in the left heel. His wounds are about the same right lateral left medial left lateral. Superficial but certainly not close to closure. He denies being systemically unwell no fever chills no abdominal pain no diarrhea 12/29/17; back in follow-up of his extensive right calf and right thigh cellulitis. I added amoxicillin to cover possible doxycycline resistant strep. This seems to of done the trick he is in much less pain there is much less erythema and swelling. He has his echocardiogram at 11:00 this morning. X-ray of the left heel was also negative. 01/05/18; the patient arrived with his edema under much  better control. Now that he is retired he is able to use his compression pumps daily and sometimes twice a day per the patient. He has a wound on the right leg the lateral wound looks better. Area on the left leg also looks a lot better. He has no evidence of cellulitis in his bilateral thighs I had a quick peak at his echocardiogram. He is in normal ejection fraction and normal left ventricular function. He has moderate pulmonary hypertension moderately reduced right ventricular function. One would have to wonder about chronic sleep apnea although he says he doesn't snore. He'll review the echocardiogram with his cardiologist. 01/12/18; the patient arrives with the edema in both legs under exemplary control. He is using his compression pumps daily and sometimes twice daily. His wound on the right lateral leg is just about closed. He still has some weeping areas on the posterior left calf and lateral left calf although everything is just about closed here as well. I have spoken with Aldean Baker who is the patient's nurse practitioner and infectious disease. She was concerned that the patient had not understood that the parenteral penicillin injections he was receiving for cellulitis prophylaxis was actually benefiting him. I don't think the  patient actually saw that I would tend to agree we were certainly dealing with less infections although he had a serious one last month. 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 He is healed. He'll be placed in juxtalite compression wraps and increase his lymphedema pumps to twice daily. We will follow up again next week to ensure there are no issues with the new regiment. 01/20/18-he is here for evaluation of bilateral lower extremity weeping edema. Yesterday he was placed in compression wrap to the right lower extremity and compression stocking to left lower shrubbery. He states he uses lymphedema pumps last night and again this morning and noted a blister to the left lower extremity. On exam he was noted to have drainage to the right lower extremity. He will be placed in Unna boots bilaterally and follow-up next week 01/26/18; patient was actually discharged a week ago to his own juxta light stockings only to return the next day with bilateral lower extremity weeping edema.he was placed in bilateral Unna boots. He arrives today with pain in the back of his left leg. There is no open area on the right leg however there is a linear/wrap injury on the left leg and weeping edema on the left leg posteriorly. I spoke with infectious disease about 10 days ago. They were disappointed that the patient elected to discontinue prophylactic intramuscular penicillin shots as they felt it was particularly beneficial in reducing the frequency of his cellulitis. I discussed this with the patient today. He does not share this view. He'll definitely need antibiotics today. Finally he is traveling to [place] and trauma leaving this Saturday and returning a week later and he does not travel with his pumps. He is going by car 01/30/18; patient was seen 4 days ago and brought back in today for review of cellulitis in the left leg posteriorly. I put him on amoxicillin this really  hasn't helped as much as I might like. He is also worried because he is traveling to [facility] by car. Finally we will be rewrapping him. There is no open area on the right leg over his left leg has multiple weeping areas as usual 02/09/18; The same wrap on for 10 days. He did not pick up  the last doxycycline I prescribed for him. He apparently took 4 days worth he already had. There is nothing open on his right leg and the edema control is really quite good. He's had damage in the left leg medially and laterally especially probably related to the prolonged use of Unna boots 02/12/18; the patient arrived in clinic today for a nurse visit/wrap change. He complained of a lot of pain in the left posterior calf. He is taking doxycycline that I previously prescribed for him. Unfortunately even though he used his stockings and apparently used to compression pumps twice a day he has weeping edema coming out of the lateral part of his right leg. This is coming from the lower anterior lateral skin area. 02/16/18; the patient has finished his doxycycline and will finish the amoxicillin 2 days. The area of cellulitis in the left calf posteriorly has resolved. He is no longer having any pain. He tells me he is using his compression pumps at least once a day sometimes twice. 02/23/18; the patient finished his doxycycline and Amoxil last week. On Friday he noticed a small erythematous circle about the size of a quarter on the left lower leg just above his ankle. This rapidly expanded and he now has erythema on the lateral and posterior part of the thigh. This is bright red. Also has an area on the dorsal foot just above his toes and a tender area just below the left popliteal fossa. He came off his prophylactic penicillin injections at his own insistence one or 2 months ago. This is obviously deteriorated since then 03/02/18; patient is on doxycycline and Amoxil. Culture I did last week of the weeping area on  the back of his left calf grew group B strep. I have therefore renewed the amoxicillin 500 3 times a day for a further week. He has not been systemically unwell. Still complaining of an area of discomfort right under his left popliteal fossa. There is no open wound on the right leg. He tells me that he is using his pumps twice a day on most days 03/09/18; patient arrives in clinic today completing his amoxicillin today. The cellulitis on his left leg is better. Furthermore he tells me that he had intramuscular penicillin shots that his primary care office today. However he also states that the wrap on his right leg fell down shortly after leaving clinic last week. He developed a large blister that was present when he came in for a nurse visit later in the week and then he developed intense discomfort around this area.He tells me he is using his compression pumps 03/16/18; the patient has completed his doxycycline. The infectious part of this/cellulitis in the left heel area left popliteal area is a lot better. He has 2 open areas on the right calf. Still areas on the left calf but this is a lot better as well. 03/24/18; the patient arrives complaining of pain in the left popliteal area again. He thinks some of this is wrap injury. He has no open area on the right leg and really no open area on the left calf either except for the popliteal area. He claims to be compliant with the compression pumps 03/31/18; I gave him doxycycline last week because of cellulitis in the left popliteal area. This is a lot better although the surface epithelium is denuded off and response to this. He arrives today with uncontrolled edema in the right calf area as well as a fingernail injury in the right  lateral calf. There is only a few open areas on the left 04/06/18; I gave him amoxicillin doxycycline over the last 2 weeks that the amoxicillin should be completing currently. He is not complaining of any pain or systemic  symptoms. The only open areas see has is on the right lateral lower leg paradoxically I cannot see anything on the left lower leg. He tells me he is using his compression pumps twice a day on most days. Silver alginate to the wounds that are open under 4 layer compression 04/13/18; he completed antibiotics and has no new complaints. Using his compression pumps. Silver alginate that anything that's opened 04/20/18; he is using his compression pumps religiously. Silver alginate 4 layer compression anything that's opened. He comes in today with no open wounds on the left leg but 3 on the right including a new one posteriorly. He has 2 on the right lateral and one on the right posterior. He likes Unna boots on the right leg for reasons that aren't really clear we had the usual 4 layer compression on the left. It may be necessary to move to the 4 layer compression on the right however for now I left them in the Unna boots 04/27/18; he is using his compression pumps at least once a day. He has still the wounds on the right lateral calf. The area right posteriorly has closed. He does not have an open wound on the left under 4 layer compression however on the dorsal left foot just proximal to the toes and the left third toe 2 small open areas were identified 05/11/18; he has not uses compression pumps. The areas on the right lateral calf have coalesced into one large wound necrotic surface. On the left side he has one small wound anteriorly however the edema is now weeping out of a large part of his left leg. He says he wasn't using his pumps because of the weeping fluid. I explained to him that this is the time he needs to pump more 05/18/18; patient states he is using his compression pumps twice a day. The area on the right lateral large wound albeit superficial. On the left side he has innumerable number of small new wounds on the left calf particularly laterally but several anteriorly and medially. All  these appear to have healthy granulated base these look like the remnants of blisters however they occurred under compression. The patient arrives in clinic today with his legs somewhat better. There is certainly less edema, less multiple open areas on the left calf and the right anterior leg looks somewhat better as well superficial and a little smaller. However he relates pain and erythema over the last 3-4 days in the thigh and I looked at this today. He has not been systemically unwell no fever no chills no change in blood sugar values 05/25/18; comes in today in a better state. The severe cellulitis on his left leg seems better with the Keflex. Not as tender. He has not been systemically unwell ooHard to find an open wound on the left lower leg using his compression pumps twice a day ooThe confluent wounds on his right lateral calf somewhat better looking. These will ultimately need debridement I didn't do this today. 06/01/18; the severe cellulitis on the left anterior thigh has resolved and he is completed his Keflex. ooThere is no open wound on the left leg however there is a superficial excoriation at the base of the third toe dorsally. Skin on the bottom of  his left foot is macerated looking. ooThe left the wounds on the lateral right leg actually looks some better although he did require debridement of the top half of this wound area with an open curet 06/09/18 on evaluation today patient appears to be doing poorly in regard to his right lower extremity in particular this appears to likely be infected he has very thick purulent discharge along with a bright green tent to the discharge. This makes me concerned about the possibility of pseudomonas. He's also having increased discomfort at this point on evaluation. Fortunately there does not appear to be any evidence of infection spreading to the other location at this time. 06/16/18 on evaluation today patient appears to actually be  doing fairly well. His ulcer has actually diminished in size quite significantly at this point which is good news. Nonetheless he still does have some evidence of infection he did see infectious disease this morning before coming here for his appointment. I did review the results of their evaluation and their note today. They did actually have him discontinue the Cipro and initiate treatment with linezolid at this time. He is doing this for the next seven days and they recommended a follow-up in four months with them. He is the keep a log of the need for intermittent antibiotic therapy between now and when he falls back up with infectious disease. This will help them gaze what exactly they need to do to try and help them out. 06/23/18; the patient arrives today with no open wounds on the left leg and left third toe healed. He is been using his compression pumps twice a day. On the right lateral leg he still has a sizable wound but this is a lot better than last time I saw this. 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 Has been using silver alginate under 4 layer compression 06/30/18; the only open wound he has is on the right lateral leg and this looks healthy. No debridement is required. We have been using silver alginate. He does not have an open wound on the left leg. There is apparently some drainage from the dorsal proximal third toe on the left although I see no open wound here. 07/03/18 on evaluation today patient was actually here just for a nurse visit rapid change. However when he was here on Wednesday for his rat change due to having been healed on the left and then developing blisters we initiated the wrap again knowing that he would be back today for [place] to reevaluate and see were at. Unfortunately he has developed some cellulitis into the proximal portion of his right lower extremity  even into the region of his thigh. 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 He was placed on one as what at that point. Nonetheless he is done with that and has been tolerating it well otherwise. Doxycycline which in the past really did not seem to be effective for him. Nonetheless I think the best option may be for [place] to definitely reinitiate the antibiotics for a longer period of time. 07/07/18; since I last saw this patient a week ago he has had a difficult time. At that point he did not have an open wound on his left leg. We transitioned him into juxta light stockings. He was apparently in the clinic the next day with blisters  on the left lateral and left medial lower calf. He also had weeping edema fluid. He was put back into a compression wrap. He was also in the clinic on Friday with intense erythema in his right thigh. Per the patient he was started on Bactrim however that didn't work at all in terms of relieving his pain and swelling. He has taken 3 doxycycline that he had left over from last time and that seems to of helped. He has blistering on the right thigh as well. 07/14/18; the erythema on his right thigh has gotten better with doxycycline that he is finishing. 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 Presumably this cellulitis in the thigh was not related to that although I think this is a bit concerning going forward. He still has an area on the right lateral calf the blister on the right medial calf just below the knee that was discussed above. On the left 2 small open areas left medial and left lateral. Edema control is adequate. He is using his compression pumps twice a day 07/20/18; continued improvement in the condition of both legs especially the edema in his bilateral thighs. He tells me he is been losing weight through a combination of diet and exercise.  He is using his compression pumps twice a day. So overall she made to the remaining wounds 07/27/2018; continued improvement in condition of both legs. His edema is well controlled. The area on the right lateral leg is just about closed he had one blisters show up on the medial left upper calf. We have him in 4 layer compression. He is going on a 10-day trip to [place], [place] and [place]. He will be driving. He wants to wear Unna boots because of the lessening amount of constriction. He will not use compression pumps while he is away 08/05/18 on evaluation today patient actually appears to be doing decently well all things considered in regard to his bilateral lower extremities. The worst ulcer is actually only posterior aspect of his left lower extremity with a four layer compression wrap cut into his leg a couple weeks back. He did have a trip and actually had [employer] for the trip that he is worn since he was last here. Nonetheless he feels like the [employer] actually do better for him his swelling is up a little bit but he also with his trip was not taking his Lasix on a regular set schedule like he was supposed to be. He states that obviously the reason being that he cannot drive and keep going without having to urinate too frequently which makes it difficult. He did not have his pumps with him while he was away either which I think also maybe playing a role here too. 08/13/2018; the patient only has a small open wound on the right lateral calf which is a big improvement in the last month or 2. He also has the area posteriorly just below the posterior fossa on the left which I think was a wrap injury from several weeks ago. He has no current evidence of cellulitis. He tells me he is back into his compression pumps twice a day. He also tells me that while he was at the laundromat somebody stole a section of his extremitease stockings 08/20/2018; back in the clinic with a  much improved state. He only has small areas on the right lateral mid  calf which is just about healed. This was is more substantial area for quite a prolonged period of time. He has a small open area on the left anterior tibia. The area on the posterior calf just below the popliteal fossa is closed today. He is using his compression pumps twice a day 08/28/2018; patient has no open wound on the right leg. He has a smattering of open areas on the calf with some weeping lymphedema. More problematically than that it looks as though his wraps of slipped down in his usual he has very angry upper area of edema just below the right medial knee and on the right lateral calf. He has no open area on his feet. The patient is traveling to [facility] next week. I will send him in an antibiotic. We will continue to wrap the right leg. We ordered extremitease stockings for him last week and I plan to transition the right leg to a stocking when he gets home which will be in 10 days time. As usual he is very reluctant to take his pumps with him when he travels 09/07/2018; patient returns from [facility]. He shows me a picture of his left leg in the mid part of his trip last week with intense fire engine erythema. The picture look bad enough I would have considered sending him to the hospital. Instead he went to the wound care center in [facility] They did not prescribe him antibiotics but he did take some doxycycline he had leftover from a previous visit. I had given him trimethoprim sulfamethoxazole before he left this did not work according to the patient. This is resulted in some improvement fortunately. He comes back with a large wound on the left posterior calf. Smaller area on the left anterior tibia. Denuded blisters on the dorsal left foot over his toes. Does not have much in the way of wounds on the right leg although he does have a very tender area on the right posterior area just below  the popliteal fossa also suggestive of infection. He promises me he is back on his pumps twice a day 09/15/2018; the intense cellulitis in his left lower calf is a lot better. The wound area on the posterior left calf is also so better. However he has reasonably extensive wounds on the dorsal aspect of his second and third toes and the proximal foot just at the base of the toes. There is nothing open on the right leg 09/22/2018; the patient has excellent edema control in his legs bilaterally. He is using his external compression pumps twice a day. He has no open area on the right leg and only the areas in the left foot dorsally second and third toe area on the left side. He does not have any signs of active cellulitis. 10/06/2018; the patient has good edema control bilaterally. He has no open wound on the right leg. There is a blister in the posterior aspect of his left calf that we had to deal with today. He is using his compression pumps twice a day. There is no signs of active cellulitis. We have been using silver alginate to the wound areas. He still has vulnerable areas on the base of his left first second toes dorsally He has a his extremities stockings and we are going to transition him today into the stocking on the right leg. He is cautioned that he will need to continue to use the compression pumps twice a day. If he notices  uncontrolled edema in the right leg he may need to go to 3 times a day. 10/13/2018; the patient came in for a nurse check on Friday he has a large flaccid blister on the right medial calf just below the knee. We unroofed this. He has this and a new area underneath the posterior mid calf which was undoubtedly a blister as well. He also has several small areas on the right which is the area we put his extremities stocking on. 10/19/2018; the patient went to see infectious disease this morning I am not sure if that was a routine follow-up in any case the doxycycline I  had given him was discontinued and started on linezolid. He has not started this. It is easy to look at his left calf and the inflammation and think this is cellulitis however he is very tender in the tissue just below the popliteal fossa and I have no doubt that there is infection going on here. He states the problem he is having is that with the compression pumps the edema goes down and then starts walking the wrap falls down. We will see if we can adhere this. He has 1 or 2 minuscule open areas on the right still areas that are weeping on the posterior left calf, the base of his left second and third toes 10/26/18; back today in clinic with quite of skin breakdown in his left anterior leg. This may have been infection the area below the popliteal fossa seems a lot better however tremendous epithelial loss on the left anterior mid tibia area over quite inexpensive tissue. He has 2 blisters on the right side but no other open wound here. 10/29/2018; came in urgently to see [place] today and we worked him in for review. He states that the 4 layer compression on the right leg caused pain he had to cut it down to roughly his mid calf this caused swelling above the wrap and he has blisters and skin breakdown today. As a result of the pain he has not been using his pumps. Both legs are a lot more edematous and there is a lot of weeping fluid. 11/02/18; arrives in clinic with continued difficulties in the right leg> left. Leg is swollen and painful. multiple skin blisters and new open areas especially laterally. He has not been using his pumps on the right leg. 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 He is not systemically unwell. 11/09/2018; the patient claims he is being compliant with his pumps. He is finished the doxycycline I gave him last week. Culture I did of the wound on the right lateral leg showed a few very resistant methicillin staph aureus.  This was resistant to doxycycline. Nevertheless he states the pain in the leg is a lot better which makes me wonder if the cultured organism was not really what was causing the problem nevertheless this is a very dangerous organism to be culturing out of any wound. His right leg is still a lot larger than the left. He is using an Radio broadcast assistant on this area, he blames a 4-layer compression for causing the original skin breakdown which I doubt is true however I cannot talk him out of it. We have been using silver alginate to all of these areas which were initially blisters 11/16/2018; patient is being compliant with his external compression pumps at twice a day. Miraculously he arrives in clinic today with absolutely no open wounds. He has better  edema control on the left where he has been using 4 layer compression versus wound of wounds on the right and I pointed this out to him. There is no inflammation in the skin in his lower legs which is also somewhat unusual for him. There is no open wounds on the dorsal left foot. He has extremitease stockings at home and I have asked him to bring these in next week. 11/25/18 patient's lower extremity on examination today on the left appears for the most part to be wound free. He does have an open wound on the lateral aspect of the right lower extremity but this is minimal compared to what I've seen in past. He does request that we go ahead and wrap the left leg as well even though there's nothing open just so hopefully it will not reopen in short order. 1/28; patient has superficial open wounds on the right lateral calf left anterior calf and left posterior calf. His edema control is adequate. 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 We have been using silver alginate as the primary dressing. He claims compliance with his compression pumps 2/4; patient has superficial open  wounds on numerous areas of his left calf and again one on the left dorsal foot. The areas on the right lateral calf have healed. The cellulitis that I gave him doxycycline for last week is also resolved this was mostly on the left anterior calf just below the tibial tuberosity. His edema looks fairly well-controlled. He tells me he went to see his primary doctor today and had blood work ordered 2/11; once again he has several open areas on the left calf left tibial area. Most of these are small and appear to have healthy granulation. He does not have anything open on the right. The edema and control in his thighs is pretty good which is usually a good indication he has been using his pumps as requested. 2/18; he continues to have several small areas on the left calf and left tibial area. Most of these are small healthy granulation. We put him in his stocking on the right leg last week and he arrives with a superficial open area over the right upper tibia and a fairly large area on the right lateral tibia in similar condition. His edema control actually does not look too bad, he claims to be using his compression pumps twice a day 2/25. Continued small areas on the left calf and left tibial area. New areas especially on the right are identified just below the tibial tuberosity and on the right upper tibia itself. There are also areas of weeping edema fluid even without an obvious wound. He does not have a considerable degree of lymphedema but clearly there is more edema here than his skin can handle. He states he is using the pumps twice a day. We have an Unna boot on the right and 4 layer compression on the left. 3/3; he continues to have an area on the right lateral calf and right posterior calf just below the popliteal fossa. There is a fair amount of tenderness around the wound on the popliteal fossa but I did not see any evidence of cellulitis, could just be that the wrap came down and rubbed  in this area. ooHe does not have an open area on the left leg however there is an area on the left dorsal foot at the base of  the third toe ooWe have been using silver alginate to all wound areas 3/10; he did not have an open area on his left leg last time he was here a week ago. Today he arrives with a horizontal wound just below the tibial tuberosity and an area on the left lateral calf. He has intense erythema and tenderness in this area. The area is on the right lateral calf and right posterior calf better than last week. We have been using silver alginate as usual 3/18 - Patient returns with 3 small open areas on left calf, and 1 small open area on right calf, the skin looks ok with no significant erythema, he continues the UNA boot on right and 4 layer compression on left. The right lateral calf wound is closed , the right posterior is small area. we will continue silver alginate to the areas. 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 He is been tolerating the dressing changes without complication. Fortunately she has made excellent improvement in regard to the overall status of his wounds. Unfortunately every time we cease wrapping him he ends up reopening in causing more significant issues at that point. Again I'm unsure of the best direction to take although I think the lymphedema clinic may be appropriate for him. 02/03/19 on evaluation today patient appears to be doing well in regard to the wounds that we saw him for last week unfortunately he has a new area on the proximal portion of his right medial/posterior lower extremity where the wrap somewhat slowed down and caused swelling and a blister to rub and open. Unfortunately this is the only opening that he has on either leg at this  point. 02/17/19 on evaluation today patient's bilateral lower extremities appear to be doing well. He still completely healed in regard to the left lower extremity. In regard to the right lower extremity the area where the wrap and slid down and caused the blister still seems to be slightly open although this is dramatically better than during the last evaluation two weeks ago. I'm very pleased with the way this stands overall. 03/03/19 on evaluation today patient appears to be doing well in regard to his right lower extremity in general although he did have a new blister open this does not appear to be showing any evidence of active infection at this time. Fortunately there's No fevers, chills, nausea, or vomiting noted at this time. Overall I feel like he is making good progress it does feel like that the right leg will we perform the [employer] seems to do with a bit better than three layer wrap on the left which slid down on him. We may switch to doing bilateral in the book wraps. 5/4; I have not seen Mr. Piekarski in quite some time. According to our case manager he did not have an open wound on his left leg last week. He had 1 remaining wound on the right posterior medial calf. He arrives today with multiple openings on the left leg probably were blisters and/or wrap injuries from Unna boots. I do not think the Unna boot's will provide adequate compression on the left. I am also not clear about the frequency he is using the compression pumps. 03/17/19 on evaluation today patient appears to be doing excellent in regard to his lower  extremities compared to last week's evaluation apparently. He had gotten significantly worse last week which is unfortunate. The [employer] wrap on the left did not seem to do very well for him at all and in fact it didn't control his swelling significantly enough he had an additional outbreak. Subsequently we go back to the four layer compression wrap on the left. This is  good news. At least in that he is doing better and the wound seem to be killing him. He still has not heard anything from the lymphedema clinic. 03/24/19 on evaluation today patient actually appears to be doing much better in regard to his bilateral lower Trinity as compared to last week when I saw him. Fortunately there's no signs of active infection at this time. He has been tolerating the dressing changes without complication. Overall I'm extremely pleased with the progress and appearance in general. 04/07/19 on evaluation today patient appears to be doing well in regard to his bilateral lower extremities. His swelling is significantly down from where it was previous. With that being said he does have a couple blisters still open at this point but fortunately nothing that seems to be too severe and again the majority of the larger openings has healed at this time. 04/14/19 on evaluation today patient actually appears to be doing quite well in regard to his bilateral lower extremities in fact I'm not even sure there's anything significantly open at this time at any site. Nonetheless he did have some trouble with these wraps where they are somewhat irritating him secondary to the fact that he has noted that the graph wasn't too close down to the end of this foot in a little bit short as well up to his knee. Otherwise things seem to be doing quite well. 04/21/19 upon evaluation today patient's wound bed actually showed evidence of being completely healed in regard to both lower extremities which is excellent news. There does not appear to be any signs of active infection which is also good news. I'm very pleased in this regard. No fevers, chills, nausea, or vomiting noted at this time. 04/28/19 on evaluation today patient appears to be doing a little bit worse in regard to both lower extremities on the left mainly due to the fact that when he went infection disease the wrap was not wrapped quite high  enough he developed a blister above this. On the right he is a small open area of nothing too significant but again this is continuing to give him some trouble he has been were in the Velcro compression that he has at home. 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 He's been tolerating the dressing changes without complication. Fortunately there's no signs of active infection at this time. No fevers, chills, nausea, or vomiting noted at this time. We have been trying to get an appointment with her lymphedema clinic in [facility] but unfortunately nobody can get them on phone with not been able to even fax information over the patient likewise is not been able to get in touch with them. Overall I'm not sure exactly what's going on here with to reach out again today. 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 Still having a lot of drainage unfortunately. He tells me especially in the left but even on the right. There's no signs of active infection which is  good news we've been using so ratcheted up to this point. 05/19/19 on evaluation today patient actually appears to be doing quite well with regard to his left lower extremity which is great news. Fortunately in regard to the right lower extremity has an issues with his wrap and he subsequently did remove this from what I'm understanding. Nonetheless long story short is what he had rewrapped once he removed it subsequently had maggots underneath this wrap whenever he came in for evaluation today. With that being said they were obviously completely cleaned away by the nursing staff. The visit today which is excellent news. However he does appear to potentially have some infection around the right ankle region where the maggots were located as well. He will likely require anabiotic therapy  today. 05/26/19 on evaluation today patient actually appears to be doing much better in regard to his bilateral lower extremities. I feel like the infection is under much better control. With that being said there were maggots noted when the wrap was removed yet again today. Again this could have potentially been left over from previous although at this time there does not appear to be any signs of significant drainage there was obviously on the wrap some drainage as well this contracted gnats or otherwise. Either way I do not see anything that appears to be doing worse in my pinion and in fact I think his drainage has slowed down quite significantly likely mainly due to the fact to his infection being under better control. 06/02/2019 on evaluation today patient actually appears to be doing well with regard to his bilateral lower extremities there is no signs of active infection at this time which is great news. With that being said he does have several open areas more so on the right than the left but nonetheless these are all significantly better than previously noted. 06/09/2019 on evaluation today patient actually appears to be doing well. His wrap stayed up and he did not cause any problems he had more drainage on the right compared to the left but overall I do not see any major issues at this time which is great news. 06/16/2019 on evaluation today patient appears to be doing excellent with regard to his lower extremities the only area that is open is a new blister that can have opened as of today on the medial ankle on the left. Other than this he really seems to be doing great I see no major issues at this point. 06/23/2019 on evaluation today patient appears to be doing quite well with regard to his bilateral lower extremities. In fact he actually appears to be almost completely healed there is a small area of weeping noted of the right lower extremity just above the ankle. Nonetheless  fortunately there is no signs of active infection at this time which is good news. No fevers, chills, nausea, vomiting, or diarrhea. 8/24; the patient arrived for a nurse visit today but complained of very significant pain in the left leg and therefore I was asked to look at this. Noted that he did not have an open area on the left leg last week nevertheless this was wrapped. The patient states that he is not been able to put his compression pumps on the left leg because of the discomfort. He has not been systemically unwell 06/30/2019 on evaluation today patient unfortunately despite being excellent last week is doing much worse with regard to his left lower extremity today. In fact he had to come  in for a nurse on Monday where his left leg had to be rewrapped due to excessive weeping Dr. Leanord Hawking placed him on doxycycline at that point. Fortunately there is no signs of active infection Systemically at this time which is good news. 07/07/2019 in regard to the patient's wounds today he actually seems to be doing well with his right lower extremity there really is nothing open or draining at this point this is great news. Unfortunately the left lower extremity is given him additional trouble at this time. There does not appear to be any signs of active infection nonetheless he does have a lot of edema and swelling noted at this point as well as blistering all of which has led to a much more poor appearing leg at this time compared to where it was 2 weeks ago when it was almost completely healed. Obviously this is a little discouraging for the patient. He is try to contact the lymphedema clinic in [place] he has not been able to get through to them. 07/14/2019 on evaluation today patient actually appears to be doing slightly better with regard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 Signed: 10/21/2019 5:32:06 PM By: [name] RN, BSN Previous Signature: 10/20/2019 11:29:45 AM Version By: Kevin Kelp PA-C Entered By: [name] on 10/20/2019 16:42:30

## 2019-10-21 DIAGNOSIS — R29898 Other symptoms and signs involving the musculoskeletal system: Secondary | ICD-10-CM | POA: Diagnosis not present

## 2019-10-21 DIAGNOSIS — I890 Lymphedema, not elsewhere classified: Secondary | ICD-10-CM | POA: Diagnosis not present

## 2019-10-21 NOTE — Progress Notes (Signed)
[name] ([id number]) Visit Report for 10/20/2019 Arrival Information Details Patient Name: Date of Service: [name], [name] 10/20/2019 10:30 AM Medical Record [id number] Patient Account Number: [id number] Date of Birth/Sex: Treating RN: [birth year] (68 y.o. [name] Primary Care Tiersa Dayley: [name] Other Clinician: Referring Kiarra Kidd: Treating Brazen Domangue/Extender:[name], Dema Severin, [name] in Treatment: 47 Visit Information History Since Last Visit Walker Added or deleted any medications: No Patient Arrived: Any new allergies or adverse reactions: No Arrival Time: 10:34 Had a fall or experienced change in No Accompanied By: self activities of daily living that may affect Transfer Assistance: None risk of falls: Patient Identification Verified: Yes Signs or symptoms of abuse/neglect since No Secondary Verification Process Completed: Yes last visito Patient Requires Transmission-Based No Hospitalized since last visit: No Precautions: Implantable device outside of the clinic No Patient Has Alerts: Yes excluding cellular tissue based products placed in the center since last visit: Has Dressing in Place as Prescribed: Yes Has Compression in Place as Prescribed: Yes Has Footwear/Offloading in Place as Yes Prescribed: Left: Other:Darco Right: Other:Darco Pain Present Now: No Electronic Signature(s) Signed: 10/21/2019 5:33:40 PM By: Kela Millin Entered By: Kela Millin on 10/20/2019 10:35:25 -------------------------------------------------------------------------------- Encounter Discharge Information Details Patient Name: Date of Service: [name] 10/20/2019 10:30 AM Medical Record [id number] Patient Account Number: [id number] Date of Birth/Sex: Treating RN: 05/18/1951 (68 y.o. [name]) [name] Primary Care Haylin Camilli: [name] Other Clinician: Referring Washington Whedbee: Treating Bhavik Cabiness/Extender:Stone  III, Dema Severin, [name] in Treatment: 195 Encounter Discharge Information Items Discharge Condition: Stable Ambulatory Status: Ambulatory Discharge Destination: Home Transportation: Private Auto Accompanied By: self Schedule Follow-up Appointment: Yes Clinical Summary of Care: Patient Declined Electronic Signature(s) Signed: 10/21/2019 9:23:41 AM By: [name] RN Entered By: [name] on 10/20/2019 11:37:18 -------------------------------------------------------------------------------- Lower Extremity Assessment Details Patient Name: Date of Service: [name] 10/20/2019 10:30 AM Medical Record [id number] Patient Account Number: [id number] Date of Birth/Sex: Treating RN: 1950-12-17 (68 y.o. [name] Primary Care Kila Godina: [name] Other Clinician: Referring Darris Carachure: Treating Darrell Leonhardt/Extender:[name], Dema Severin, [name] in Treatment: 195 Edema Assessment Assessed: [Left: No] [Right: No] Edema: [Left: Yes] [Right: Yes] Calf Left: Right: Point of Measurement: 31 cm From Medial Instep 39.8 cm 27 cm Ankle Left: Right: Point of Measurement: 12 cm From Medial Instep 24 cm 35.5 cm Vascular Assessment Pulses: Dorsalis Pedis Palpable: [Left:Yes] [Right:Yes] Electronic Signature(s) Signed: 10/21/2019 5:33:40 PM By: Kela Millin Entered By: Kela Millin on 10/20/2019 10:53:46 -------------------------------------------------------------------------------- Multi-Disciplinary Care Plan Details Patient Name: Date of Service: [name] 10/20/2019 10:30 AM Medical Record [id number] Patient Account Number: [id number] Date of Birth/Sex: Treating RN: [birth year] (68 y.o. [name] Primary Care Nataliee Shurtz: [name] Other Clinician: Referring Yiannis Tulloch: Treating Masa Lubin/Extender:[name], Dema Severin, [name] in Treatment: 195 Active Inactive Venous Leg Ulcer Nursing Diagnoses: Actual  venous Insuffiency (use after diagnosis is confirmed) Goals: Patient will maintain optimal edema control Date Initiated: 09/10/2016 Target Resolution Date: 11/05/2019 Goal Status: Active Verify adequate tissue perfusion prior to therapeutic compression application Date Initiated: 09/10/2016 Date Inactivated: 11/28/2016 Goal Status: Met Interventions: Assess peripheral edema status every visit. Compression as ordered Provide education on venous insufficiency Notes: edema not contolled above wraps, pt not using lymoh pumps regularly Wound/Skin Impairment Nursing Diagnoses: Impaired tissue integrity Goals: Patient/caregiver will verbalize understanding of skin care regimen Date Initiated: 09/10/2016 Target Resolution Date: 11/05/2019 Goal Status: Active Interventions: Assess patient/caregiver ability to perform ulcer/skin care regimen upon admission and as needed Assess ulceration(s) every visit Provide education  on ulcer and skin care Notes: Electronic Signature(s) Signed: 10/21/2019 5:32:06 PM By: Levan Hurst RN, BSN Entered By: Levan Hurst on 10/20/2019 11:05:53 -------------------------------------------------------------------------------- Pain Assessment Details Patient Name: Date of Service: [name] 10/20/2019 10:30 AM Medical Record [id number] Patient Account Number: [id number] Date of Birth/Sex: Treating RN: [birth year] (68 y.o. [name] Primary Care Madlynn Lundeen: [name] Other Clinician: Referring Sherika Kubicki: Treating Gerilynn Mccullars/Extender:[name], Dema Severin, [name] in Treatment: 195 Active Problems Location of Pain Severity and Description of Pain Patient Has Paino No Site Locations Pain Management and Medication Current Pain Management: Electronic Signature(s) Signed: 10/21/2019 5:33:40 PM By: Kela Millin Entered By: Kela Millin on 10/20/2019  10:38:33 -------------------------------------------------------------------------------- Patient/Caregiver Education Details Patient Name: [name] 12/16/2020andnbsp10:30 Date of Service: AM Medical Record [id number] Number: Patient Account Number: [id number] Treating RN: Date of Birth/Gender: 1951-01-14 (68 y.o. [name]) Other Clinician: Primary Care Physician: [name] Treating Worthy Keeler Referring Physician: Physician/Extender: Deno Etienne in Treatment: 35 Education Assessment Education Provided To: Patient Education Topics Provided Wound/Skin Impairment: Methods: Explain/Verbal Responses: State content correctly Electronic Signature(s) Signed: 10/21/2019 5:32:06 PM By: Levan Hurst RN, BSN Entered By: Levan Hurst on 10/20/2019 11:06:27 -------------------------------------------------------------------------------- Wound Assessment Details Patient Name: Date of Service: [name] 10/20/2019 10:30 AM Medical Record [id number] Patient Account Number: [id number] Date of Birth/Sex: Treating RN: [birth year] (68 y.o. [name] Primary Care Ray Gervasi: [name] Other Clinician: Referring Sammy Douthitt: Treating Huntley Knoop/Extender:[name], Dema Severin, [name] in Treatment: 195 Wound Status Wound Number: [id number] Primary Lymphedema Etiology: Wound Location: Right Upper Leg - Medial Wound Open Wounding Event: Gradually Appeared Status: Date Acquired: 09/12/2019 Comorbid Chronic sinus problems/congestion, Weeks Of Treatment: 5 History: Arrhythmia, Hypertension, Peripheral Arterial Clustered Wound: No Disease, Type II Diabetes, History of Burn, Gout, Confinement Anxiety Photos Wound Measurements Length: (cm) 6 % Reduction Width: (cm) 5 % Reduction Depth: (cm) 0.1 Epitheliali Area: (cm) 23.562 Tunneling: Volume: (cm) 2.356 Underminin Wound Description Classification: Partial Thickness Wound  Margin: Flat and Intact Exudate Amount: Medium Exudate Type: Serous Exudate Color: amber Wound Bed Granulation Amount: Large (67-100%) Granulation Quality: Pink Necrotic Amount: Small (1-33%) Necrotic Quality: Adherent Slough Foul Odor After Cleansing: No Slough/Fibrino Yes Exposed Structure Fascia Exposed: No Fat Layer (Subcutaneous Tissue) Exposed: Yes Tendon Exposed: No Muscle Exposed: No Joint Exposed: No Bone Exposed: No in Area: -1900.2% in Volume: -1896.6% zation: Small (1-33%) No g: No Treatment Notes Wound #145 (Right, Medial Upper Leg) 1. Cleanse With Wound Cleanser 2. Periwound Care Barrier cream TCA Cream 4. Secondary Dressing ABD Pad 5. Secured With Recruitment consultant) Signed: 10/21/2019 3:39:50 PM By: Mikeal Hawthorne EMT/HBOT Signed: 10/21/2019 5:33:40 PM By: Kela Millin Entered By: Mikeal Hawthorne on 10/21/2019 13:55:22 -------------------------------------------------------------------------------- Wound Assessment Details Patient Name: Date of Service: ALLAH, REASON 10/20/2019 10:30 AM Medical Record [id number] Patient Account Number: [id number] Date of Birth/Sex: Treating RN: 19-Apr-1951 (68 y.o. [name] Primary Care Lodema Parma: [name] Other Clinician: Referring Sameen Leas: Treating Michiel Sivley/Extender:[name], Dema Severin, [name] in Treatment: 195 Wound Status Wound Number: 147 Primary Lymphedema Etiology: Wound Location: Left Lower Leg - Circumferential Wound Healed - Epithelialized Wounding Event: Blister Status: Date Acquired: 09/30/2019 Comorbid Chronic sinus problems/congestion, Weeks Of Treatment: 2 History: Arrhythmia, Hypertension, Peripheral Arterial Clustered Wound: No Disease, Type II Diabetes, History of Burn, Gout, Confinement Anxiety Photos Wound Measurements Length: (cm) 0 % Reduc Width: (cm) 0 % Reduc Depth: (cm) 0 Epithel Area: (cm) 0 Tunnel Volume: (cm) 0  Underm Wound Description Classification: Full Thickness Without  Exposed [place] Wound Distinct, outline attached Margin: Exudate None Present Amount: Wound Bed Granulation Amount: None Present (0%) Necrotic Amount: None Present (0%) Fascia Fat La Tendon Muscle Joint Bone E dor After Cleansing: No /Fibrino No Exposed Structure Exposed: No yer (Subcutaneous Tissue) Exposed: No Exposed: No Exposed: No Exposed: No xposed: No tion in Area: 100% tion in Volume: 100% ialization: Large (67-100%) ing: No ining: No Electronic Signature(s) Signed: 10/21/2019 3:39:50 PM By: Mikeal Hawthorne EMT/HBOT Signed: 10/21/2019 5:33:40 PM By: Kela Millin Entered By: Mikeal Hawthorne on 10/21/2019 13:58:15 -------------------------------------------------------------------------------- Wound Assessment Details Patient Name: Date of Service: [name] 10/20/2019 10:30 AM Medical Record [id number] Patient Account Number: [id number] Date of Birth/Sex: Treating RN: 11/23/1950 (68 y.o. [name] Primary Care Julis Haubner: [name] Other Clinician: Referring Faysal Fenoglio: Treating Sekai Gitlin/Extender:[name], Dema Severin, [name] in Treatment: 195 Wound Status Wound Number: [id number] Primary Lymphedema Etiology: Etiology: Wound Location: Left Upper Leg - Medial Wound Open Wounding Event: Blister Status: Date Acquired: 10/04/2019 Comorbid Chronic sinus problems/congestion, Weeks Of Treatment: 2 History: Arrhythmia, Hypertension, Peripheral Arterial Clustered Wound: No Disease, Type II Diabetes, History of Burn, Gout, Confinement Anxiety Photos Wound Measurements Length: (cm) 6 % Reduct Width: (cm) 6.5 % Reduct Depth: (cm) 0.1 Epitheli Area: (cm) 30.631 Tunneli Volume: (cm) 3.063 Undermi Wound Description Classification: Full Thickness Without Exposed Support Foul Odo Structures Slough/F Wound Flat and  Intact Margin: Exudate Medium Amount: Exudate Serous Type: Exudate amber Color: Wound Bed Granulation Amount: Medium (34-66%) Granulation Quality: Red Fascia E Necrotic Amount: Medium (34-66%) Fat Laye Necrotic Quality: Adherent Slough Tendon E Muscle E Joint Ex Bone Exp r After Cleansing: No ibrino Yes Exposed Structure xposed: No r (Subcutaneous Tissue) Exposed: Yes xposed: No xposed: No posed: No osed: No ion in Area: 13.3% ion in Volume: 13.3% alization: None ng: No ning: No Treatment Notes Wound #149 (Left, Medial Upper Leg) 1. Cleanse With Wound Cleanser 2. Periwound Care Barrier cream TCA Cream 4. Secondary Dressing ABD Pad 5. Secured With Recruitment consultant) Signed: 10/21/2019 3:39:50 PM By: Mikeal Hawthorne EMT/HBOT Signed: 10/21/2019 5:33:40 PM By: Kela Millin Entered By: Mikeal Hawthorne on 10/21/2019 13:54:13 -------------------------------------------------------------------------------- Wound Assessment Details Patient Name: Date of Service: PLEZ, BELTON 10/20/2019 10:30 AM Medical Record [id number] Patient Account Number: [id number] Date of Birth/Sex: Treating RN: [birth year] (68 y.o. [name] Primary Care Cheryle Dark: [name] Other Clinician: Referring Tyniah Kastens: Treating Yaneth Fairbairn/Extender:[name], Dema Severin, [name] in Treatment: 195 Wound Status Wound Number: 150 Primary Lymphedema Etiology: Wound Location: Left Lower Leg - Anterior Wound Healed - Epithelialized Wounding Event: Blister Status: Date Acquired: 10/13/2019 Comorbid Chronic sinus problems/congestion, Weeks Of Treatment: 1 History: Arrhythmia, Hypertension, Peripheral Arterial Clustered Wound: No Disease, Type II Diabetes, History of Burn, Gout, Confinement Anxiety Photos Wound Measurements Length: (cm) 0 % Reduc Width: (cm) 0 % Reduc Depth: (cm) 0 Epithel Area: (cm) 0 Tunnel Volume: (cm) 0 Underm Wound  Description Classification: Full Thickness Without Exposed Support Structures Wound Distinct, outline attached Margin: Exudate None Present Amount: Wound Bed Granulation Amount: None Present (0%) Necrotic Amount: None Present (0%) Foul Odor After Cleansing: No Slough/Fibrino No Exposed Structure Fascia Exposed: No Fat Layer (Subcutaneous Tissue) Exposed: No Tendon Exposed: No Muscle Exposed: No Joint Exposed: No Bone Exposed: No tion in Area: 100% tion in Volume: 100% ialization: Large (67-100%) ing: No ining: No Electronic Signature(s) Signed: 10/21/2019 3:39:50 PM By: Mikeal Hawthorne EMT/HBOT Signed: 10/21/2019 5:33:40 PM By: Kela Millin Entered By: Mikeal Hawthorne on 10/21/2019 13:56:38 --------------------------------------------------------------------------------  Wound Assessment Details Patient Name: Date of Service: TOREY, REINARD 10/20/2019 10:30 AM Medical Record [id number] Patient Account Number: [id number] Date of Birth/Sex: Treating RN: 1951-10-15 (68 y.o. [name] Primary Care Heath Badon: [name] Other Clinician: Referring Etosha Wetherell: Treating Salvatore Poe/Extender:[name], Dema Severin, [name] in Treatment: 195 Wound Status Wound Number: 151 Primary Lymphedema Etiology: Wound Location: Left Lower Leg - Medial Wound Open Wounding Event: Blister Status: Date Acquired: 10/16/2019 Comorbid Chronic sinus problems/congestion, Weeks Of Treatment: 0 History: Arrhythmia, Hypertension, Peripheral Arterial Clustered Wound: No Disease, Type II Diabetes, History of Burn, Gout, Confinement Anxiety Photos Wound Measurements Length: (cm) 2.8 % Reduc Width: (cm) 6.5 % Reduc Depth: (cm) 0.1 Epithel Area: (cm) 14.294 Tunnel Volume: (cm) 1.429 Underm Wound Description Full Thickness Without Exposed Support Foul O Classification: Structures Slough Wound Distinct, outline attached Distinct, outline attached Margin: Exudate  Medium Amount: Exudate Serous Type: Exudate amber Color: Wound Bed Granulation Amount: Large (67-100%) Granulation Quality: Pink, Pale Fascia Exp Necrotic Amount: None Present (0%) Fat Layer Tendon Exp Muscle Exp Joint Expo Bone Expos dor After Cleansing: No /Fibrino No Exposed Structure osed: No (Subcutaneous Tissue) Exposed: Yes osed: No osed: No sed: No ed: No tion in Area: 0% tion in Volume: 0% ialization: None ing: No ining: No Assessment Notes green/blue drainage Treatment Notes Wound #151 (Left, Medial Lower Leg) 1. Cleanse With Wound Cleanser Soap and water 3. Primary Dressing Applied Calcium Alginate Ag 4. Secondary Dressing ABD Pad Dry Gauze 6. Support Layer Applied 4 layer compression Water quality scientist) Signed: 10/21/2019 3:39:50 PM By: Mikeal Hawthorne EMT/HBOT Signed: 10/21/2019 5:33:40 PM By: Kela Millin Entered By: Mikeal Hawthorne on 10/21/2019 13:58:51 -------------------------------------------------------------------------------- Wound Assessment Details Patient Name: Date of Service: HOGAN, HOOBLER 10/20/2019 10:30 AM Medical Record [id number] Patient Account Number: [id number] Date of Birth/Sex: Treating RN: 1951/01/02 (68 y.o. [name] Primary Care Imari Sivertsen: [name] Other Clinician: Referring Jafeth Mustin: Treating Kaiyla Stahly/Extender:[name], Dema Severin, [name] in Treatment: 195 Wound Status Wound Number: 152 Primary Lymphedema Etiology: Wound Location: Right Toe Second Wound Open Wounding Event: Gradually Appeared Status: Date Acquired: 10/16/2019 Date Acquired: 10/16/2019 Comorbid Chronic sinus problems/congestion, Weeks Of Treatment: 0 History: Arrhythmia, Hypertension, Peripheral Arterial Clustered Wound: Yes Disease, Type II Diabetes, History of Burn, Gout, Confinement Anxiety Photos Wound Measurements Length: (cm) 3 % Reduct Width: (cm) 1.5 % Reduct Depth: (cm) 0.1  Epitheli Clustered Quantity: 3 Tunnelin Area: (cm) 3.534 Undermi Volume: (cm) 0.353 Wound Description Full Thickness Without Exposed Support Foul Odo Classification: Structures Slough/F Wound Distinct, outline attached Margin: Exudate Medium Amount: Exudate Serous Type: Exudate amber Color: Wound Bed Granulation Amount: Large (67-100%) Granulation Quality: Pink Fascia E Necrotic Amount: None Present (0%) Fat Laye Tendon E Muscle E Joint Ex Bone Exp r After Cleansing: No ibrino No Exposed Structure xposed: No r (Subcutaneous Tissue) Exposed: Yes xposed: No xposed: No posed: No osed: No ion in Area: 0% ion in Volume: 0% alization: None g: No ning: No Treatment Notes Wound #152 (Right Toe Second) 1. Cleanse With Wound Cleanser Soap and water 3. Primary Dressing Applied Calcium Alginate Ag 4. Secondary Dressing Dry Gauze Roll Gauze 5. Secured With Recruitment consultant) Signed: 10/21/2019 3:39:50 PM By: Mikeal Hawthorne EMT/HBOT Signed: 10/21/2019 5:33:40 PM By: Kela Millin Entered By: Mikeal Hawthorne on 10/21/2019 13:59:30 -------------------------------------------------------------------------------- Vitals Details Patient Name: Date of Service: [name]. 10/20/2019 10:30 AM Medical Record [id number] Patient Account Number: [id number] Date of Birth/Sex: Treating RN: 1951-05-08 (68 y.o. [name] Primary Care Sharia Averitt: Anitra Lauth,  [name] Other Clinician: Referring Laquana Villari: Treating Brannen Koppen/Extender:[name], Hoyt MCGOWEN, [name] in Treatment: 195 Vital Signs Time Taken: 10:37 Temperature (F): 98.1 Height (in): 70 Pulse (bpm): 63 Weight (lbs): 380.2 Respiratory Rate (breaths/min): 20 Body Mass Index (BMI): 54.5 Blood Pressure (mmHg): 149/58 Reference Range: 80 - 120 mg / dl Electronic Signature(s) Signed: 10/21/2019 5:33:40 PM By: Kela Millin Entered By: Kela Millin on 10/20/2019  10:38:22

## 2019-10-26 ENCOUNTER — Other Ambulatory Visit: Payer: Self-pay | Admitting: Family Medicine

## 2019-10-27 ENCOUNTER — Other Ambulatory Visit: Payer: Self-pay

## 2019-10-27 ENCOUNTER — Encounter (HOSPITAL_BASED_OUTPATIENT_CLINIC_OR_DEPARTMENT_OTHER): Payer: Medicare Other | Admitting: Physician Assistant

## 2019-10-27 DIAGNOSIS — L97212 Non-pressure chronic ulcer of right calf with fat layer exposed: Secondary | ICD-10-CM | POA: Diagnosis not present

## 2019-10-27 DIAGNOSIS — L03116 Cellulitis of left lower limb: Secondary | ICD-10-CM | POA: Diagnosis not present

## 2019-10-27 DIAGNOSIS — L97222 Non-pressure chronic ulcer of left calf with fat layer exposed: Secondary | ICD-10-CM | POA: Diagnosis not present

## 2019-10-27 DIAGNOSIS — E11621 Type 2 diabetes mellitus with foot ulcer: Secondary | ICD-10-CM | POA: Diagnosis not present

## 2019-10-27 DIAGNOSIS — Z9119 Patient's noncompliance with other medical treatment and regimen: Secondary | ICD-10-CM | POA: Diagnosis not present

## 2019-10-27 DIAGNOSIS — E1151 Type 2 diabetes mellitus with diabetic peripheral angiopathy without gangrene: Secondary | ICD-10-CM | POA: Diagnosis not present

## 2019-10-27 DIAGNOSIS — Z794 Long term (current) use of insulin: Secondary | ICD-10-CM | POA: Diagnosis not present

## 2019-10-27 DIAGNOSIS — I482 Chronic atrial fibrillation, unspecified: Secondary | ICD-10-CM | POA: Diagnosis not present

## 2019-10-27 DIAGNOSIS — I87333 Chronic venous hypertension (idiopathic) with ulcer and inflammation of bilateral lower extremity: Secondary | ICD-10-CM | POA: Diagnosis not present

## 2019-10-27 DIAGNOSIS — I872 Venous insufficiency (chronic) (peripheral): Secondary | ICD-10-CM | POA: Diagnosis not present

## 2019-10-27 DIAGNOSIS — L97512 Non-pressure chronic ulcer of other part of right foot with fat layer exposed: Secondary | ICD-10-CM | POA: Diagnosis not present

## 2019-10-27 DIAGNOSIS — I890 Lymphedema, not elsewhere classified: Secondary | ICD-10-CM | POA: Diagnosis not present

## 2019-10-27 DIAGNOSIS — E11622 Type 2 diabetes mellitus with other skin ulcer: Secondary | ICD-10-CM | POA: Diagnosis not present

## 2019-10-27 DIAGNOSIS — L97119 Non-pressure chronic ulcer of right thigh with unspecified severity: Secondary | ICD-10-CM | POA: Diagnosis not present

## 2019-10-27 DIAGNOSIS — I272 Pulmonary hypertension, unspecified: Secondary | ICD-10-CM | POA: Diagnosis not present

## 2019-10-27 NOTE — Progress Notes (Addendum)
REYLI, SCHROTH ([id number]) Visit Report for 10/27/2019 Chief Complaint Document Details Patient Name: Date of Service: [name], [name] 10/27/2019 10:15 AM Medical Record [id number] Patient Account Number: [id number] Date of Birth/Sex: Treating RN: 19-Apr-1951 (68 y.o. M) Primary Care Provider: Nicoletta Ba Other Clinician: Referring Provider: Treating Provider/Extender:[name], [name], [name] in Treatment: 196 Information Obtained from: Patient Chief Complaint patient is here for evaluation venous/lymphedema weeping Electronic Signature(s) Signed: 10/27/2019 10:51:27 AM By: Lenda Kelp PA-C Entered By: Lenda Kelp on 10/27/2019 10:51:27 -------------------------------------------------------------------------------- HPI Details Patient Name: Date of Service: [name], [name] 10/27/2019 10:15 AM Medical Record [id number] Patient Account Number: [id number] Date of Birth/Sex: Treating RN: [birth year] (68 y.o. M) Primary Care Provider: Nicoletta Ba Other Clinician: Referring Provider: Treating Provider/Extender:[name], [name], [name] in Treatment: 196 History of Present Illness HPI Description: Referred by PCP for consultation. Patient has long standing history of BLE venous stasis, no prior ulcerations. At beginning of month, developed cellulitis and weeping. Received IM Rocephin followed by Keflex and resolved. Wears compression stocking, appr 6 months old. Not sure strength. No present drainage. 01/22/16 this is a patient who is a type II diabetic on insulin. 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 He tells me he religiously wears pressure stockings of uncertain strength. He was here with weeping edema about 8 months ago but did not have an open wound. Roughly a month ago he had a reopening on his bilateral legs. He is been using bandages and Neosporin. He does not complain of pain. 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 Last BUN/creatinine I have is from 11/20/15 at 13 and 1.0 respectively 01/29/16; patient arrives today having tolerated the Profore wrap. He brought in his stockings and these are 18 mmHg stockings he bought from [place]. The compression here is likely inadequate. He does not complain of pain or excessive drainage she has no systemic symptoms. The wound on the right looks improved as does the one on the left although one on the left is more substantial with still tissue at risk below the actual wound area on the bilateral posterior calf 02/05/16; patient arrives with poor edema control. He states that we did put a 4 layer compression on it last week. No weight appear 5 this. 02/12/16; the area on the posterior right Has healed. The left Has a substantial wound that has necrotic surface eschar that requires a debridement with a curette. 02/16/16;the patient called or a Nurse visit secondary to increased swelling. He had been in earlier in the week with his right leg healed. He was transitioned to is on pressure stocking on the right leg with the only open wound on the left, a substantial area on the left posterior calf. Note he has a history of severe lower extremity edema, he has a history of chronic atrial fibrillation but not heart failure per my notes but I'll need to research this. He is not complaining of chest pain shortness of breath or orthopnea. The intake nurse noted blisters on the previously closed right leg 02/19/16; this is the patient's regular visit day. I see him on Friday with escalating edema new wounds on the right leg and clear signs of at least right ventricular heart failure. I increased his Lasix to 40 twice a day. He is returning currently in follow-up. States he is noticed a decrease  in that the edema 02/26/16 patient's legs have much  less edema. There is nothing really open on the right leg. The left leg has improved condition of the large superficial wound on the posterior left leg 03/04/16; edema control is very much better. The patient's right leg wounds have healed. On the left leg he continues to have severe venous inflammation on the posterior aspect of the left leg. There is no tenderness and I don't think any of this is cellulitis. 03/11/16; patient's right leg is married healed and he is in his own stocking. The patient's left leg has deteriorated somewhat. There is a lot of erythema around the wound on the posterior left leg. There is also a significant rim of erythema posteriorly just above where the wrap would've ended there is a new wound in this location and a lot of tenderness. Can't rule out cellulitis in this area. 03/15/16; patient's right leg remains healed and he is in his own stocking. The patient's left leg is much better than last review. His major wound on the posterior aspect of his left Is almost fully epithelialized. He has 3 small injuries from the wraps. Really. Erythema seems a lot better on antibiotics 03/18/16; right leg remains healed and he is in his own stocking. The patient's left leg is much better. The area on the posterior aspect of the left calf is fully epithelialized. His 3 small injuries which were wrap injuries on the left are improved only one seems still open his erythema has resolved 03/25/16; patient's right leg remains healed and he is in his own stocking. There is no open area today on the left leg posterior leg is completely closed up. His wrap injuries at the superior aspect of his leg are also resolved. He looks as though he has some irritation on the dorsal ankle but this is fully epithelialized without evidence of infection. 03/28/16; we discharged this patient on Monday. Transitioned him into his own stocking. There were problems almost immediately with uncontrolled swelling  weeping edema multiple some of which have opened. He does not feel systemically unwell in particular no chest pain no shortness of breath and he does not feel 04/08/16; the edema is under better control with the Profore light wrap but he still has pitting edema. There is one large wound anteriorly 2 on the medial aspect of his left leg and 3 small areas on the superior posterior calf. Drainage is not excessive he is tolerating a Profore light well 04/15/16; put a Profore wrap on him last week. This is controlled is edema however he had a lot of pain on his left anterior foot most of his wounds are healed 04/22/16 once again the patient has denuded areas on the left anterior foot which he states are because his wrap slips up word. He saw his primary physician today is on Lasix 40 twice a day and states that he his weight is down 20 pounds over the last 3 months. 04/29/16: Much improved. left anterior foot much improved. He is now on Lasix 80 mg per day. Much improved edema control 05/06/16; I was hoping to be able to discharge him today however once again he has blisters at a low level of where the compression was placed last week mostly on his left lateral but also his left medial leg and a small area on the anterior part of the left foot. 05/09/16; apparently the patient went home after his appointment on 7/4 later in the evening developing pain  in his upper medial thigh together with subjective fever and chills although his temperature was not taken. The pain was so intense he felt he would probably have to call 911. However he then remembered that he had leftover doxycycline from a previous round of antibiotics and took these. By the next morning he felt a lot better. He called and spoke to one of our nurses and I approved doxycycline over the phone thinking that this was in relation to the wounds we had previously seen although they were definitely were not. The patient feels a lot better old fever no  chills he is still working. Blood sugars are reasonably controlled 05/13/16; patient is back in for review of his cellulitis on his anterior medial upper thigh. He is taking doxycycline this is a lot better. Culture I did of the nodular area on the dorsal aspect of his foot grew MRSA this also looks a lot better. 05/20/16; the patient is cellulitis on the medial upper thigh has resolved. All of his wound areas including the left anterior foot, areas on the medial aspect of the left calf and the lateral aspect of the calf at all resolved. He has a new blister on the left dorsal foot at the level of the fourth toe this was excised. No evidence of infection 05/27/16; patient continues to complain weeping edema. He has new blisterlike wounds on the left anterior lateral and posterior lateral calf at the top of his wrap levels. The area on his left anterior foot appears better. He is not complaining of fever, pain or pruritus in his feet. 05/30/16; the patient's blisters on his left anterior leg posterior calf all look improved. He did not increase the Lasix 100 mg as I suggested because he was going to run out of his 40 mg tablets. He is still having weeping edema of his toes 06/03/16; I renewed his Lasix at 80 mg once a day as he was about to run out when I last saw him. He is on 80 mg of Lasix now. I have asked him to cut down on the excessive amount of water he was drinking and asked him to drink according to his thirst mechanisms 06/12/2016 -- was seen 2 days ago and was supposed to wear his compression stockings at home but he is developed lymphedema and superficial blisters on the left lower extremity and hence came in for a review 06/24/16; the remaining wound is on his left anterior leg. He still has edema coming from between his toes. There is lymphedema here however his edema is generally better than when I last saw this. He has a history of atrial fibrillation but does not have a known history  of congestive heart failure nevertheless I think he probably has this at least on a diastolic basis. 07/01/16 I reviewed his echocardiogram from January 2017. This was essentially normal. He did not have LVH, EF of 55-60%. His right ventricular function was normal although he did have trivial tricuspid and pulmonic regurgitation. This is not audible on exam however. I increased his Lasix to do massive edema in his legs well above his knees I think in early July. He was also drinking an excessive amount of water at the time. 07/15/16; missed his appointment last week because of the Labor Day holiday on Monday. He could not get another appointment later in the week. Started to feel the wrap digging in superiorly so we remove the top half and the bottom half of his wrap.  He has extensive erythema and blistering superiorly in the left leg. Very tender. Very swollen. Edema in his foot with leaking edema fluid. He has not been systemically unwell 07/22/16; the area on the left leg laterally required some debridement. The medial wounds look more stable. His wrap injury wounds appear to have healed. Edema and his foot is better, weeping edema is also better. He tells me he is meeting with the supplier of the external compression pumps at work 08/05/16; the patient was on vacation last week in [facility]. His wrap is been on for an extended period of time. Also over the weekend he developed an extensive area of tender erythema across his anterior medial thigh. He took to doxycycline yesterday that he had leftover from a previous prescription. The patient complains of weeping edema coming out of his toes 08/08/16; I saw this patient on 10/2. He was tender across his anterior thigh. I put him on doxycycline. He returns today in follow-up. He does not have any open wounds on his lower leg, he still has edema weeping into his toes. 08/12/16; patient was seen back urgently today to follow-up for his extensive left  thigh cellulitis/erysipelas. He comes back with a lot less swelling and erythema pain is much better. I believe I gave him Augmentin and Cipro. His wrap was cut down as he stated a roll down his legs. He developed blistering above the level of the wrap that remained. He has 2 open blisters and 1 intact. 08/19/16; patient is been doing his primary doctor who is increased his Lasix from 40-80 once a day or 80 already has less edema. Cellulitis has remained improved in the left thigh. 2 open areas on the posterior left calf 08/26/16; he returns today having new open blisters on the anterior part of his left leg. He has his compression pumps but is not yet been shown how to use some vital representative from the supplier. 09/02/16 patient returns today with no open wounds on the left leg. Some maceration in his plantar toes 09/10/2016 -- Dr. Leanord Hawking had recently discharged him on 09/02/2016 and he has come right back with redness swelling and some open ulcers on his left lower extremity. He says this was caused by trying to apply his compression stockings and he's been unable to use this and has not been able to use his lymphedema pumps. He had some doxycycline leftover and he has started on this a few days ago. 09/16/16; there are no open wounds on his leg on the left and no evidence of cellulitis. 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 He does not complain of symptoms here. I am not clear use using his external compression pumps. 09/23/16; I have not seen this patient in 2 weeks. He canceled his appointment 10 days ago as he was going on vacation. He tells me that on Monday he noticed a large area on his posterior left leg which is been draining copiously and is reopened into a large wound. He is been using ABDs and the external part of his juxtalite, according to our nurse this was not on properly. 10/07/16; Still a substantial area on the  posterior left leg. Using silver alginate 10/14/16; in general better although there is still open area which looks healthy. Still using silver alginate. He reminds me that this happen before he left for [facility]. Today while he was showering in the morning. He had been using  his juxtalite's 10/21/16; the area on his posterior left leg is fully epithelialized. However he arrives today with a large area of tender erythema in his medial and posterior left thigh just above the knee. I have marked the area. Once again he is reluctant to consider hospitalization. I treated him with oral antibiotics in the past for a similar situation with resolution I think with doxycycline however this area it seems more extensive to me. He is not complaining of fever but does have chills and says states he is thirsty. His blood sugar today was in the 140s at home 10/25/16 the area on his posterior left leg is fully epithelialized although there is still some weeping edema. The large area of tenderness and erythema in his medial and posterior left thigh is a lot less tender although there is still a lot of swelling in this thigh. He states he feels a lot better. He is on doxycycline and Augmentin that I started last week. This will continued until Tuesday, December 26. I have ordered a duplex ultrasound of the left thigh rule out DVT whether there is an abscess something that would need to be drained I would also like to know. 11/01/16; he still has weeping edema from a not fully epithelialized area on his left posterior calf. Most of the rest of this looks a lot better. He has completed his antibiotics. His thigh is a lot better. Duplex ultrasound did not show a DVT in the thigh 11/08/16; he comes in today with more Denuded surface epithelium from the posterior aspect of his calf. There is no real evidence of cellulitis. The superior aspect of his wrap appears to have put quite an indentation in his leg just below  the knee and this may have contributed. He does not complain of pain or fever. We have been using silver alginate as the primary dressing. The area of cellulitis in the right thigh has totally resolved. He has been using his compression stockings once a week 11/15/16; the patient arrives today with more loss of epithelium from the posterior aspect of his left calf. He now has a fairly substantial wound in this area. The reason behind this deterioration isn't exactly clear although his edema is not well controlled. He states he feels he is generally more swollen systemically. He is not complaining of chest pain shortness of breath fever. Tells me he has an appointment with his primary physician in early February. He is on 80 mg of oral Lasix a day. He claims compliance with the external compression pumps. He is not having any pain in his legs similar to what he has with his recurrent cellulitis 11/22/16; the patient arrives a follow-up of his large area on his left lateral calf. This looks somewhat better today. He came in earlier in the week for a dressing change since I saw him a week ago. He is not complaining of any pain no shortness of breath no chest pain 11/28/16; the patient arrives for follow-up of his large area on the left lateral calf this does not look better. In fact it is larger weeping edema. The surface of the wound does not look too bad. We have been using silver alginate although I'm not certain that this is a dressing issue. 12/05/16; again the patient follows up for a large wound on the left lateral and left posterior calf this does not look better. There continues to be weeping edema necrotic surface tissue. More worrisome than this once again there  is erythema below the wound involving the distal Achilles and heel suggestive of cellulitis. He is on his feet working most of the day of this is not going well. We are changing his dressing twice a week to facilitate the  drainage. 12/12/16; not much change in the overall dimensions of the large area on the left posterior calf. This is very inflamed looking. I gave him an. Doxycycline last week does not really seem to have helped. He found the wrap very painful indeed it seems to of dog into his legs superiorly and perhaps around the heel. He came in early today because the drainage had soaked through his dressings. 12/19/16- patient arrives for follow-up evaluation of his left lower extremity ulcers. He states that he is using his lymphedema pumps once daily when there is "no drainage". He admits to not using his lipedema pumps while under current treatment. His blood sugars have been consistently between 150-200. 12/26/16; the patient is not using his compression pumps at home because of the wetness on his feet. I've advised him that I think it's important for him to use this daily. He finds his feet too wet, he can put a plastic bag over his legs while he is in the pumps. Otherwise I think will be in a vicious circle. We are using silver alginate to the major area on his left posterior calf 01/02/17; the patient's posterior left leg has further of all into 3 open wounds. All of them covered with a necrotic surface. He claims to be using his compression pumps once a day. His edema control is marginal. Continue with silver alginate 01/10/17; the patient's left posterior leg actually looks somewhat better. There is less edema, less erythema. Still has 3 open areas covered with a necrotic surface requiring debridement. He claims to be using his compression pumps once a day his edema control is better 01/17/17; the patient's left posterior calf look better last week when I saw him and his wrap was changed 2 days ago. He has noted increasing pain in the left heel and arrives today with much larger wounds extensive erythema extending down into the entire heel area especially tender medially. He is not systemically unwell CBGs  have been controlled no fever. Our intake nurse showed me limegreen drainage on his AVD pads. 01/24/17; his usual this patient responds nicely to antibiotics last week giving him Levaquin for presumed Pseudomonas. The whole entire posterior part of his leg is much better much less inflamed and in the case of his Achilles heel area much less tender. He has also had some epithelialization posteriorly there are still open areas here and still draining but overall considerably better 01/31/17- He has continue to tolerate the compression wraps. he states that he continues to use the lymphedema pumps daily, and can increase to twice daily on the weekends. He is voicing no complaints or concerns regarding his LLE ulcers 02/07/17-he is here for follow-up evaluation. He states that he noted some erythema to the left medial and anterior thigh, which he states is new as of yesterday. He is concerned about recurrent cellulitis. He states his blood sugars have been slightly elevated, this morning in the 180s 02/14/17; he is here for follow-up evaluation. When he was last here there was erythema superiorly from his posterior wound in his anterior thigh. He was prescribed Levaquin however a culture of the wound surface grew MRSA over the phone I changed him to doxycycline on Monday and things seem to be a  lot better. 02/24/17; patient missed his appointment on Friday therefore we changed his nurse visit into a physician visit today. Still using silver alginate on the large area of the posterior left thigh. He isn't new area on the dorsal left second toe 03/03/17; actually better today although he admits he has not used his external compression pumps in the last 2 days or so because of work responsibilities over the weekend. 03/10/17; continued improvement. External compression pumps once a day almost all of his wounds have closed on the posterior left calf. Better edema control 03/17/17; in general improved. He still  has 3 small open areas on the lateral aspect of his left leg however most of the area on the posterior part of his leg is epithelialized. He has better edema control. He has an ABD pad under his stocking on the right anterior lower leg although he did not let us look at that today. 03/24/17; patient arrives back in clinic today with no open areas however there are areas on the posterior left calf and anterior left calf that are less than 100% epithelialized. His edema is well controlled in the left lower leg. There is some pitting edema probably lymphedema in the left upper thigh. He uses compression pumps at home once per day. I tried to get him to do this twice a day although he is very reticent. 04/01/2017 -- for the last 2 days he's had significant redness, tenderness and weeping and came in for an urgent visit today. 04/07/17; patient still has 6 more days of doxycycline. He was seen by Dr. Meyer Russel last Wednesday for cellulitis involving the posterior aspect, lateral aspect of his Involving his heel. For the most part he is better there is less erythema and less weeping. He has been on his feet for 12 hours 2 over the weekend. Using his compression pumps once a day 04/14/17 arrives today with continued improvement. Only one area on the posterior left calf that is not fully epithelialized. He has intense bilateral venous inflammation associated with his chronic venous insufficiency disease and secondary lymphedema. We have been using silver alginate to the left posterior calf wound In passing he tells [place] today that the right leg but we have not seen in quite some time has an open area on it but he doesn't want [place] to look at this today states he will show this to [place] next week. 04/21/17; there is no open area on his left leg although he still reports some weeping edema. He showed [place] his right leg today which is the first time we've seen this leg in a long time. He has a large area of open wound on the  right leg anteriorly healthy granulation. Quite a bit of swelling in the right leg and some degree of venous inflammation. He told us about the right leg in passing last week but states that deterioration in the right leg really only happened over the weekend 04/28/17; there is no open area on the left leg although there is an irritated part on the posterior which is like a wrap injury. The wound on the right leg which was new from last week at least to [place] is a lot better. 05/05/17; still no open area on the left leg. Patient is using his new compression stocking which seems to be doing a good job of controlling the edema. He states he is using his compression pumps once per day. The right leg still has an open wound although it  is better in terms of surface area. Required debridement. A lot of pain in the posterior right Achilles marked tenderness. Usually this type of presentation this patient gives concern for an active cellulitis 05/12/17; patient arrives today with his major wound from last week on the right lateral leg somewhat better. Still requiring debridement. He was using his compression stocking on the left leg however that is reopened with superficial wounds anteriorly he did not have an open wound on this leg previously. He is still using his juxta light's once daily at night. He cannot find the time to do this in the morning as he has to be at work by 7 AM 05/19/17; right lateral leg wound looks improved. No debridement required. The concerning area is on the left posterior leg which appears to almost have a subcutaneous hemorrhagic component to it. We've been using silver alginate to all the wounds 05/26/17; the right lateral leg wound continues to look improved. However the area on the left posterior calf is a tightly adherent surface. [place] using silver alginate. Because of the weeping edema in his legs there is very little good alternatives. 06/02/17; the patient left here last week  looking quite good. Major wound on the left posterior calf and a small one on the right lateral calf. Both of these look satisfactory. He tells me that by Wednesday he had noted increased pain in the left leg and drainage. He called on Thursday and Friday to get an appointment here but we were blocked. He did not go to urgent care or his primary physician. He thinks he had a fever on Thursday but did not actually take his temperature. He has not been using his compression pumps on the left leg because of pain. I advised him to go to the emergency room today for IV antibiotics for stents of left leg cellulitis but he has refused I have asked him to take 2 days off work to keep his leg elevated and he has refused this as well. In view of this I'm going to call him and Augmentin and doxycycline. 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 However after a detailed discussion regarding the risk benefits and alternatives he refuses to get admitted to the hospital. With no other recourse I will continue him on oral antibiotics as before and hopefully he'll have his infectious disease consultation this week. 06/16/2017 -- the patient was seen today by the nurse practitioner at infectious disease Ms. Dixon. 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 He has also been advise Lotrimin cream locally. she also had wise IV antibiotics for his condition if it flares up 06/23/17; patient arrives today with drainage bilaterally although the remaining wound on the left posterior calf after cleaning up  today "highlighter yellow drainage" did not look too bad. Unfortunately he has had breakdown on the right anterior leg [previously this leg had not been open and he is using a black stocking] he went to see infectious disease and is been put on clindamycin 150 mg daily, I did not verify the  dose although I'm not familiar with using clindamycin in this dosing range, perhaps for prophylaxisoo 06/27/17; I brought this patient back today to follow-up on the wound deterioration on the right lower leg together with surrounding cellulitis. I started him on doxycycline 4 days ago. This area looks better however he comes in today with intense cellulitis on the medial part of his left thigh. This is not have a wound in this area. Extremely tender. We've been using silver alginate to the wounds on the right lower leg left lower leg with bilateral 4 layer compression he is using his external compression pumps once a day 07/04/17; patient's left medial thigh cellulitis looks better. He has not been using his compression pumps as his insert said it was contraindicated with cellulitis. His right leg continues to make improvements all the wounds are still open. We only have one remaining wound on the left posterior calf. Using silver alginate to all open areas. He is on doxycycline which I started a week ago and should be finishing I gave him Augmentin after Thursday's visit for the severe cellulitis on the left medial thigh which fortunately looks better 07/14/17; the patient's left medial thigh cellulitis has resolved. The cellulitis in his right lower calf on the right also looks better. All of his wounds are stable to improved we've been using silver alginate he has completed the antibiotics I have given him. He has clindamycin 150 mg once a day prescribed by infectious disease for prophylaxis, I've advised him to start this now. We have been using bilateral Unna boots over silver alginate to the wound  areas 07/21/17; the patient is been to see infectious disease who noted his recurrent problems with cellulitis. He was not able to tolerate prophylactic clindamycin therefore he is on amoxicillin 500 twice a day. He also had a second daily dose of Lasix added By Dr. Melford Aase but he is not taking this. Nor is he being completely compliant with his compression pumps a especially not this week. He has 2 remaining wounds one on the right posterior lateral lower leg and one on the left posterior medial lower leg. 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 The patient has Unna boots bilaterally. 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 He has bilateral Unna boots for compression and silver alginate to his wounds. Arrives today with his legs looking as good as I have seen him in quite some time. Not surprisingly his wounds look better as well with improvement on the right lateral leg venous insufficiency wound and also the left medial leg. He is still using the compression pumps once a day 08/11/17; both legs appear to be doing better wounds on the right lateral and left medial legs look better. Skin on the right leg quite good. He is been using silver alginate as the primary dressing. I'm going to use Anasept gel calcium alginate and maintain all the secondary dressings 08/18/17; the patient continues to actually do quite well. The area on his right lateral leg is just about closed the left medial also looks better although it is still moist in this area. His edema is well controlled we have been using Anasept gel with calcium alginate and the usual secondary dressings, 4 layer compression and once daily use of his compression  pumps "always been able to manage 09/01/17; the  patient continues to do reasonably well in spite of his trip to [place]. The area on the right lateral leg is epithelialized. Left is much better but still open. He has more edema and more chronic erythema on the left leg [venous inflammation] 09/08/17; he arrives today with no open wound on the right lateral leg and decently controlled edema. Unfortunately his left leg is not nearly as in his good situation as last week.he apparently had increasing edema starting on Saturday. He edema soaked through into his foot so used a plastic bag to walk around his home. The area on the medial right leg which was his open area is about the same however he has lost surface epithelium on the left lateral which is new and he has significant pain in the Achilles area of the left foot. He is already on amoxicillin chronically for prophylaxis of cellulitis in the left leg 09/15/17; he is completed a week of doxycycline and the cellulitis in the left posterior leg and Achilles area is as usual improved. He still has a lot of edema and fluid soaking through his dressings. There is no open wound on the right leg. He saw infectious disease NP today 09/22/17;As usual 1 we transition him from our compression wraps to his stockings things did not go well. He has several small open areas on the right leg. He states this was caused by the compression wrap on his skin although he did not wear this with the stockings over them. He has several superficial areas on the left leg medially laterally posteriorly. He does not have any evidence of active cellulitis especially involving the left Achilles The patient is traveling from [facility] Saturday going to [facility]. He states he isn't attempting to get an appointment with a heel objects wound center there to change his dressings. I am not completely certain whether this will work 10/06/17; the patient came in on Friday for a nurse visit and the nurse reported that his legs  actually look quite good. He arrives in clinic today for his regular follow-up visit. He has a new wound on his left third toe over the PIP probably caused by friction with his footwear. He has small areas on the left leg and a very superficial but epithelialized area on the right anterior lateral lower leg. Other than that his legs look as good as I've seen him in quite some time. We have been using silver alginate Review of systems; no chest pain no shortness of breath other than this a 10 point review of systems negative 10/20/17; seen by Dr. Meyer Russel last week. He had taken some antibiotics [doxycycline] that he had left over. Dr. Meyer Russel thought he had candida infection and declined to give him further antibiotics. He has a small wound remaining on the right lateral leg several areas on the left leg including a larger area on the left posterior several left medial and anterior and a small wound on the left lateral. The area on the left dorsal third toe looks a lot better. ROS; Gen.; no fever, respiratory no cough no sputum Cardiac no chest pain other than this 10 point review of system is negative 10/30/17; patient arrives today having fallen in the bathtub 3 days ago. It took him a while to get up. He has pain and maceration in the wounds on his left leg which have deteriorated. He has not been using his pumps he also has some  maceration on the right lateral leg. 11/03/17; patient continues to have weeping edema especially in the left leg. This saturates his dressings which were just put on on 12/27. As usual the doxycycline seems to take care of the cellulitis on his lower leg. He is not complaining of fever, chills, or other systemic symptoms. He states his leg feels a lot better on the doxycycline I gave him empirically. He also apparently gets injections at his primary doctor's officeo Rocephin for cellulitis prophylaxis. I didn't ask him about his compression pump compliance today I think  that's probably marginal. Arrives in the clinic with all of his dressings primary and secondary macerated full of fluid and he has bilateral edema 11/10/17; the patient's right leg looks some better although there is still a cluster of wounds on the right lateral. The left leg is inflamed with almost circumferential skin loss medially to laterally although we are still maintaining anteriorly. He does not have overt cellulitis there is a lot of drainage. He is not using compression pumps. We have been using silver alginate to the wound areas, there are not a lot of options here 11/17/17; the patient's right leg continues to be stable although there is still open wounds, better than last week. The inflammation in the left leg is better. Still loss of surface layer epithelium especially posteriorly. There is no overt cellulitis in the amount of edema and his left leg is really quite good, tells me he is using his compression pumps once a day. 11/24/17; patient's right leg has a small superficial wound laterally this continues to improve. The inflammation in the left leg is still improving however we have continuous surface layer epithelial loss posteriorly. There is no overt cellulitis in the amount of edema in both legs is really quite good. He states he is using his compression pumps on the left leg once a day for 5 out of 7 days 12/01/17; very small superficial areas on the right lateral leg continue to improve. Edema control in both legs is better today. He has continued loss of surface epithelialization and left posterior calf although I think this is better. We have been using silver alginate with large number of absorptive secondary dressings 4 layer on the left Unna boot on the right at his request. He tells me he is using his compression pumps once a day 12/08/17; he has no open area on the right leg is edema control is good here. On the left leg however he has marked erythema and tenderness  breakdown of skin. He has what appears to be a wrap injury just distal to the popliteal fossa. 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 There was swelling erythema and breakdown in the right leg. He has much worse skin breakdown in the left leg as well multiple open areas medially and posteriorly as well as laterally. He tells me he has been using his compression pumps but tells me he feels that the drainage out of his leg is worse when he uses a compression pumps. To be fair to him he is been saying this for a while however I don't know that I have really been listening to this. I wonder if the compression pumps are  working properly 12/22/17;. Once again he arrives with severe erythema, weeping edema from the left greater than right leg. Noncompliance with compression pumps. New this visit he is complaining of pain on the lateral aspect of the right leg and the medial aspect of his right thigh. He apparently saw his cardiologist Dr. [name] who was ordered an echocardiogram area and I think this is a step in the right direction 12/25/17; started his doxycycline Monday night. There is still intense erythema of the right leg especially in the anterior thigh although there is less tenderness. The erythema around the wound on the right lateral calf also is less tender. He still complaining of pain in the left heel. His wounds are about the same right lateral left medial left lateral. Superficial but certainly not close to closure. He denies being systemically unwell no fever chills no abdominal pain no diarrhea 12/29/17; back in follow-up of his extensive right calf and right thigh cellulitis. I added amoxicillin to cover  possible doxycycline resistant strep. This seems to of done the trick he is in much less pain there is much less erythema and swelling. He has his echocardiogram at 11:00 this morning. X-ray of the left heel was also negative. 01/05/18; the patient arrived with his edema under much better control. Now that he is retired he is able to use his compression pumps daily and sometimes twice a day per the patient. He has a wound on the right leg the lateral wound looks better. Area on the left leg also looks a lot better. He has no evidence of cellulitis in his bilateral thighs I had a quick peak at his echocardiogram. He is in normal ejection fraction and normal left ventricular function. He has moderate pulmonary hypertension moderately reduced right ventricular function. One would have to wonder about chronic sleep apnea although he says he doesn't snore. He'll review the echocardiogram with his cardiologist. 01/12/18; the patient arrives with the edema in both legs under exemplary control. He is using his compression pumps daily and sometimes twice daily. His wound on the right lateral leg is just about closed. He still has some weeping areas on the posterior left calf and lateral left calf although everything is just about closed here as well. I have spoken with Aldean Baker who is the patient's nurse practitioner and infectious disease. She was concerned that the patient had not understood that the parenteral penicillin injections he was receiving for cellulitis prophylaxis was actually benefiting him. I don't think the patient actually saw that I would tend to agree we were certainly dealing with less infections although he had a serious one last month. 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 He is healed. He'll be placed in juxtalite compression wraps and increase his lymphedema pumps to twice daily. We will follow up again next week to ensure there are no  issues with the new regiment. 01/20/18-he is here for evaluation of bilateral lower extremity weeping edema. Yesterday he was placed in compression wrap to the right lower extremity and compression stocking to left lower shrubbery. He states he uses lymphedema pumps last night and again this morning and noted a blister to the left lower extremity. On exam he was noted to have drainage to the right lower extremity. He will be placed in Unna boots bilaterally and follow-up next week 01/26/18; patient was actually discharged a week ago to his own juxta light stockings only to return the next day with bilateral lower  extremity weeping edema.he was placed in bilateral Unna boots. He arrives today with pain in the back of his left leg. There is no open area on the right leg however there is a linear/wrap injury on the left leg and weeping edema on the left leg posteriorly. I spoke with infectious disease about 10 days ago. They were disappointed that the patient elected to discontinue prophylactic intramuscular penicillin shots as they felt it was particularly beneficial in reducing the frequency of his cellulitis. I discussed this with the patient today. He does not share this view. He'll definitely need antibiotics today. Finally he is traveling to [place] and trauma leaving this Saturday and returning a week later and he does not travel with his pumps. He is going by car 01/30/18; patient was seen 4 days ago and brought back in today for review of cellulitis in the left leg posteriorly. I put him on amoxicillin this really hasn't helped as much as I might like. He is also worried because he is traveling to [facility] trauma by car. Finally we will be rewrapping him. There is no open area on the right leg over his left leg has multiple weeping areas as usual 02/09/18; The same wrap on for 10 days. He did not pick up the last doxycycline I prescribed for him. He apparently took 4 days worth he already had.  There is nothing open on his right leg and the edema control is really quite good. He's had damage in the left leg medially and laterally especially probably related to the prolonged use of Unna boots 02/12/18; the patient arrived in clinic today for a nurse visit/wrap change. He complained of a lot of pain in the left posterior calf. He is taking doxycycline that I previously prescribed for him. Unfortunately even though he used his stockings and apparently used to compression pumps twice a day he has weeping edema coming out of the lateral part of his right leg. This is coming from the lower anterior lateral skin area. 02/16/18; the patient has finished his doxycycline and will finish the amoxicillin 2 days. The area of cellulitis in the left calf posteriorly has resolved. He is no longer having any pain. He tells me he is using his compression pumps at least once a day sometimes twice. 02/23/18; the patient finished his doxycycline and Amoxil last week. On Friday he noticed a small erythematous circle about the size of a quarter on the left lower leg just above his ankle. This rapidly expanded and he now has erythema on the lateral and posterior part of the thigh. This is bright red. Also has an area on the dorsal foot just above his toes and a tender area just below the left popliteal fossa. He came off his prophylactic penicillin injections at his own insistence one or 2 months ago. This is obviously deteriorated since then 03/02/18; patient is on doxycycline and Amoxil. Culture I did last week of the weeping area on the back of his left calf grew group B strep. I have therefore renewed the amoxicillin 500 3 times a day for a further week. He has not been systemically unwell. Still complaining of an area of discomfort right under his left popliteal fossa. There is no open wound on the right leg. He tells me that he is using his pumps twice a day on most days 03/09/18; patient arrives in clinic  today completing his amoxicillin today. The cellulitis on his left leg is better. Furthermore he tells me that  he had intramuscular penicillin shots that his primary care office today. However he also states that the wrap on his right leg fell down shortly after leaving clinic last week. He developed a large blister that was present when he came in for a nurse visit later in the week and then he developed intense discomfort around this area.He tells me he is using his compression pumps 03/16/18; the patient has completed his doxycycline. The infectious part of this/cellulitis in the left heel area left popliteal area is a lot better. He has 2 open areas on the right calf. Still areas on the left calf but this is a lot better as well. 03/24/18; the patient arrives complaining of pain in the left popliteal area again. He thinks some of this is wrap injury. He has no open area on the right leg and really no open area on the left calf either except for the popliteal area. He claims to be compliant with the compression pumps 03/31/18; I gave him doxycycline last week because of cellulitis in the left popliteal area. This is a lot better although the surface epithelium is denuded off and response to this. He arrives today with uncontrolled edema in the right calf area as well as a fingernail injury in the right lateral calf. There is only a few open areas on the left 04/06/18; I gave him amoxicillin doxycycline over the last 2 weeks that the amoxicillin should be completing currently. He is not complaining of any pain or systemic symptoms. The only open areas see has is on the right lateral lower leg paradoxically I cannot see anything on the left lower leg. He tells me he is using his compression pumps twice a day on most days. Silver alginate to the wounds that are open under 4 layer compression 04/13/18; he completed antibiotics and has no new complaints. Using his compression pumps. Silver alginate  that anything that's opened 04/20/18; he is using his compression pumps religiously. Silver alginate 4 layer compression anything that's opened. He comes in today with no open wounds on the left leg but 3 on the right including a new one posteriorly. He has 2 on the right lateral and one on the right posterior. He likes Unna boots on the right leg for reasons that aren't really clear we had the usual 4 layer compression on the left. It may be necessary to move to the 4 layer compression on the right however for now I left them in the Unna boots 04/27/18; he is using his compression pumps at least once a day. He has still the wounds on the right lateral calf. The area right posteriorly has closed. He does not have an open wound on the left under 4 layer compression however on the dorsal left foot just proximal to the toes and the left third toe 2 small open areas were identified 05/11/18; he has not uses compression pumps. The areas on the right lateral calf have coalesced into one large wound necrotic surface. On the left side he has one small wound anteriorly however the edema is now weeping out of a large part of his left leg. He says he wasn't using his pumps because of the weeping fluid. I explained to him that this is the time he needs to pump more 05/18/18; patient states he is using his compression pumps twice a day. The area on the right lateral large wound albeit superficial. On the left side he has innumerable number of small new wounds on  the left calf particularly laterally but several anteriorly and medially. All these appear to have healthy granulated base these look like the remnants of blisters however they occurred under compression. The patient arrives in clinic today with his legs somewhat better. There is certainly less edema, less multiple open areas on the left calf and the right anterior leg looks somewhat better as well superficial and a little smaller. However he relates pain  and erythema over the last 3-4 days in the thigh and I looked at this today. He has not been systemically unwell no fever no chills no change in blood sugar values 05/25/18; comes in today in a better state. The severe cellulitis on his left leg seems better with the Keflex. Not as tender. He has not been systemically unwell Hard to find an open wound on the left lower leg using his compression pumps twice a day The confluent wounds on his right lateral calf somewhat better looking. These will ultimately need debridement I didn't do this today. 06/01/18; the severe cellulitis on the left anterior thigh has resolved and he is completed his Keflex. There is no open wound on the left leg however there is a superficial excoriation at the base of the third toe dorsally. Skin on the bottom of his left foot is macerated looking. The left the wounds on the lateral right leg actually looks some better although he did require debridement of the top half of this wound area with an open curet 06/09/18 on evaluation today patient appears to be doing poorly in regard to his right lower extremity in particular this appears to likely be infected he has very thick purulent discharge along with a bright green tent to the discharge. This makes me concerned about the possibility of pseudomonas. He's also having increased discomfort at this point on evaluation. Fortunately there does not appear to be any evidence of infection spreading to the other location at this time. 06/16/18 on evaluation today patient appears to actually be doing fairly well. His ulcer has actually diminished in size quite significantly at this point which is good news. Nonetheless he still does have some evidence of infection he did see infectious disease this morning before coming here for his appointment. I did review the results of their evaluation and their note today. They did actually have him discontinue the Cipro and initiate treatment with  linezolid at this time. He is doing this for the next seven days and they recommended a follow-up in four months with them. He is the keep a log of the need for intermittent antibiotic therapy between now and when he falls back up with infectious disease. This will help them gaze what exactly they need to do to try and help them out. 06/23/18; the patient arrives today with no open wounds on the left leg and left third toe healed. He is been using his compression pumps twice a day. On the right lateral leg he still has a sizable wound but this is a lot better than last time I saw this. 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 Has been using silver alginate under 4 layer compression 06/30/18; the only open wound he has is on the right lateral leg and this looks healthy. No debridement is required. We have been using silver alginate. He does not have an open wound on the left leg. There is apparently some drainage  from the dorsal proximal third toe on the left although I see no open wound here. 07/03/18 on evaluation today patient was actually here just for a nurse visit rapid change. However when he was here on Wednesday for his rat change due to having been healed on the left and then developing blisters we initiated the wrap again knowing that he would be back today for [place] to reevaluate and see were at. Unfortunately he has developed some cellulitis into the proximal portion of his right lower extremity even into the region of his thigh. 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 He was placed on one as what at that point. Nonetheless he is done with that and has been tolerating it well otherwise. Doxycycline which in the past really did not seem to be effective for him. Nonetheless I think the best option may be for [place] to definitely reinitiate the  antibiotics for a longer period of time. 07/07/18; since I last saw this patient a week ago he has had a difficult time. At that point he did not have an open wound on his left leg. We transitioned him into juxta light stockings. He was apparently in the clinic the next day with blisters on the left lateral and left medial lower calf. He also had weeping edema fluid. He was put back into a compression wrap. He was also in the clinic on Friday with intense erythema in his right thigh. Per the patient he was started on Bactrim however that didn't work at all in terms of relieving his pain and swelling. He has taken 3 doxycycline that he had left over from last time and that seems to of helped. He has blistering on the right thigh as well. 07/14/18; the erythema on his right thigh has gotten better with doxycycline that he is finishing. 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 Presumably this cellulitis in the thigh was not related to that although I think this is a bit concerning going forward. He still has an area on the right lateral calf the blister on the right medial calf just below the knee that was discussed above. On the left 2 small open areas left medial and left lateral. Edema control is adequate. He is using his compression pumps twice a day 07/20/18; continued improvement in the condition of both legs especially the edema in his bilateral thighs. He tells me he is been losing weight through a combination of diet and exercise. He is using his compression pumps twice a day. So overall she made to the remaining wounds 07/27/2018; continued improvement in condition of both legs. His edema is well controlled. The area on the right lateral leg is just about closed he had one blisters show up on the medial left upper calf. We have him in 4 layer compression. He is going on a 10-day trip to [place], [place] and  [place]. He will be driving. He wants to wear Unna boots because of the lessening amount of constriction. He will not use compression pumps while he is away 08/05/18 on evaluation today patient actually appears to be doing decently well all things considered in regard to his bilateral lower extremities. The worst ulcer is actually only posterior aspect of his left lower extremity with a four layer compression wrap cut into his leg a couple weeks back. He did have  a trip and actually had [employer] wraps for the trip that he is worn since he was last here. Nonetheless he feels like the [employer] wraps actually do better for him his swelling is up a little bit but he also with his trip was not taking his Lasix on a regular set schedule like he was supposed to be. He states that obviously the reason being that he cannot drive and keep going without having to urinate too frequently which makes it difficult. He did not have his pumps with him while he was away either which I think also maybe playing a role here too. 08/13/2018; the patient only has a small open wound on the right lateral calf which is a big improvement in the last month or 2. He also has the area posteriorly just below the posterior fossa on the left which I think was a wrap injury from several weeks ago. He has no current evidence of cellulitis. He tells me he is back into his compression pumps twice a day. He also tells me that while he was at the laundromat somebody stole a section of his extremitease stockings 08/20/2018; back in the clinic with a much improved state. He only has small areas on the right lateral mid calf which is just about healed. This was is more substantial area for quite a prolonged period of time. He has a small open area on the left anterior tibia. The area on the posterior calf just below the popliteal fossa is closed today. He is using his compression pumps twice a day 08/28/2018; patient has no open wound  on the right leg. He has a smattering of open areas on the calf with some weeping lymphedema. More problematically than that it looks as though his wraps of slipped down in his usual he has very angry upper area of edema just below the right medial knee and on the right lateral calf. He has no open area on his feet. [place] I will send him in an antibiotic. We will continue to wrap the right leg. We ordered extremitease stockings for him last week and I plan to transition the right leg to a stocking when he gets home which will be in 10 days time. As usual he is very reluctant to take his pumps with him when he travels 09/07/2018; [place] He shows me a picture of his left leg in the mid part of his trip last week with intense fire engine erythema. The picture look bad enough I would have considered sending him to the hospital. Instead he went [place] They did not prescribe him antibiotics but he did take some doxycycline he had leftover from a previous visit. I had given him trimethoprim sulfamethoxazole before he left this did not work according to the patient. This is resulted in some improvement fortunately. He comes back with a large wound on the left posterior calf. Smaller area on the left anterior tibia. Denuded blisters on the dorsal left foot over his toes. Does not have much in the way of wounds on the right leg although he does have a very tender area on the right posterior area just below the popliteal fossa also suggestive of infection. He promises me he is back on his pumps twice a day 09/15/2018; the intense cellulitis in his left lower calf is a lot better. The wound area on  the posterior left calf is also so better. However he has reasonably extensive wounds on the dorsal aspect of his second and third toes and the proximal foot just at the base of the toes. There is  nothing open on the right leg 09/22/2018; the patient has excellent edema control in his legs bilaterally. He is using his external compression pumps twice a day. He has no open area on the right leg and only the areas in the left foot dorsally second and third toe area on the left side. He does not have any signs of active cellulitis. 10/06/2018; the patient has good edema control bilaterally. He has no open wound on the right leg. There is a blister in the posterior aspect of his left calf that we had to deal with today. He is using his compression pumps twice a day. There is no signs of active cellulitis. We have been using silver alginate to the wound areas. He still has vulnerable areas on the base of his left first second toes dorsally He has a his extremities stockings and we are going to transition him today into the stocking on the right leg. He is cautioned that he will need to continue to use the compression pumps twice a day. If he notices uncontrolled edema in the right leg he may need to go to 3 times a day. 10/13/2018; the patient came in for a nurse check on Friday he has a large flaccid blister on the right medial calf just below the knee. We unroofed this. He has this and a new area underneath the posterior mid calf which was undoubtedly a blister as well. He also has several small areas on the right which is the area we put his extremities stocking on. 10/19/2018; the patient went to see infectious disease this morning I am not sure if that was a routine follow-up in any case the doxycycline I had given him was discontinued and started on linezolid. He has not started this. It is easy to look at his left calf and the inflammation and think this is cellulitis however he is very tender in the tissue just below the popliteal fossa and I have no doubt that there is infection going on here. He states the problem he is having is that with the compression pumps the edema goes down and  then starts walking the wrap falls down. We will see if we can adhere this. He has 1 or 2 minuscule open areas on the right still areas that are weeping on the posterior left calf, the base of his left second and third toes 10/26/18; back today in clinic with quite of skin breakdown in his left anterior leg. This may have been infection the area below the popliteal fossa seems a lot better however tremendous epithelial loss on the left anterior mid tibia area over quite inexpensive tissue. He has 2 blisters on the right side but no other open wound here. 10/29/2018; came in urgently to see [place] today and we worked him in for review. He states that the 4 layer compression on the right leg caused pain he had to cut it down to roughly his mid calf this caused swelling above the wrap and he has blisters and skin breakdown today. As a result of the pain he has not been using his pumps. Both legs are a lot more edematous and there is a lot of weeping fluid. 11/02/18; arrives in clinic with continued difficulties in the right leg> left.  Leg is swollen and painful. multiple skin blisters and new open areas especially laterally. He has not been using his pumps on the right leg. 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 He is not systemically unwell. 11/09/2018; the patient claims he is being compliant with his pumps. He is finished the doxycycline I gave him last week. Culture I did of the wound on the right lateral leg showed a few very resistant methicillin staph aureus. This was resistant to doxycycline. Nevertheless he states the pain in the leg is a lot better which makes me wonder if the cultured organism was not really what was causing the problem nevertheless this is a very dangerous organism to be culturing out of any wound. His right leg is still a lot larger than the left. He is using an Radio broadcast assistant on this area, he blames a 4-layer compression for  causing the original skin breakdown which I doubt is true however I cannot talk him out of it. We have been using silver alginate to all of these areas which were initially blisters 11/16/2018; patient is being compliant with his external compression pumps at twice a day. Miraculously he arrives in clinic today with absolutely no open wounds. He has better edema control on the left where he has been using 4 layer compression versus wound of wounds on the right and I pointed this out to him. There is no inflammation in the skin in his lower legs which is also somewhat unusual for him. There is no open wounds on the dorsal left foot. He has extremitease stockings at home and I have asked him to bring these in next week. 11/25/18 patient's lower extremity on examination today on the left appears for the most part to be wound free. He does have an open wound on the lateral aspect of the right lower extremity but this is minimal compared to what I've seen in past. He does request that we go ahead and wrap the left leg as well even though there's nothing open just so hopefully it will not reopen in short order. 1/28; patient has superficial open wounds on the right lateral calf left anterior calf and left posterior calf. His edema control is adequate. 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 We have been using silver alginate as the primary dressing. He claims compliance with his compression pumps 2/4; patient has superficial open wounds on numerous areas of his left calf and again one on the left dorsal foot. The areas on the right lateral calf have healed. The cellulitis that I gave him doxycycline for last week is also resolved this was mostly on the left anterior calf just below the tibial tuberosity. His edema looks fairly well-controlled. He tells me he went to see his primary doctor today and had blood work  ordered 2/11; once again he has several open areas on the left calf left tibial area. Most of these are small and appear to have healthy granulation. He does not have anything open on the right. The edema and control in his thighs is pretty good which is usually a good indication he has been using his pumps as requested. 2/18; he continues to have several small areas on the left calf and left tibial area. Most of these are small healthy granulation. We put him in his stocking on the right leg last week and  he arrives with a superficial open area over the right upper tibia and a fairly large area on the right lateral tibia in similar condition. His edema control actually does not look too bad, he claims to be using his compression pumps twice a day 2/25. Continued small areas on the left calf and left tibial area. New areas especially on the right are identified just below the tibial tuberosity and on the right upper tibia itself. There are also areas of weeping edema fluid even without an obvious wound. He does not have a considerable degree of lymphedema but clearly there is more edema here than his skin can handle. He states he is using the pumps twice a day. We have an Unna boot on the right and 4 layer compression on the left. 3/3; he continues to have an area on the right lateral calf and right posterior calf just below the popliteal fossa. There is a fair amount of tenderness around the wound on the popliteal fossa but I did not see any evidence of cellulitis, could just be that the wrap came down and rubbed in this area. He does not have an open area on the left leg however there is an area on the left dorsal foot at the base of the third toe We have been using silver alginate to all wound areas 3/10; he did not have an open area on his left leg last time he was here a week ago. Today he arrives with a horizontal wound just below the tibial tuberosity and an area on the left lateral calf.  He has intense erythema and tenderness in this area. The area is on the right lateral calf and right posterior calf better than last week. We have been using silver alginate as usual 3/18 - Patient returns with 3 small open areas on left calf, and 1 small open area on right calf, the skin looks ok with no significant erythema, he continues the UNA boot on right and 4 layer compression on left. The right lateral calf wound is closed , the right posterior is small area. we will continue silver alginate to the areas. 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 He is been tolerating the dressing changes without complication. Fortunately she has made excellent improvement in regard to the overall status of his wounds. Unfortunately every time we cease wrapping him he ends up reopening in causing more significant issues at that point. Again I'm unsure of the best direction to take although I think the lymphedema clinic may be appropriate for him. 02/03/19 on evaluation today patient appears to be doing well in regard to the wounds that we saw him for last week unfortunately he has a new area on the proximal portion of his right medial/posterior lower extremity where the wrap somewhat slowed down and caused swelling and a blister to rub and open. Unfortunately this is the only opening that he has on either leg at this point. 02/17/19 on evaluation today patient's bilateral lower extremities appear to be doing well. He still completely healed in regard to the left lower extremity. In regard to the right lower extremity the area where the wrap and slid down and caused the blister still seems to be slightly open although this is dramatically better than during the last evaluation two weeks  ago. I'm very pleased with  the way this stands overall. 03/03/19 on evaluation today patient appears to be doing well in regard to his right lower extremity in general although he did have a new blister open this does not appear to be showing any evidence of active infection at this time. Fortunately there's No fevers, chills, nausea, or vomiting noted at this time. Overall I feel like he is making good progress it does feel like that the right leg will we perform the [employer] seems to do with a bit better than three layer wrap on the left which slid down on him. We may switch to doing bilateral in the book wraps. 5/4; I have not seen Mr. Johnson in quite some time. According to our case manager he did not have an open wound on his left leg last week. He had 1 remaining wound on the right posterior medial calf. He arrives today with multiple openings on the left leg probably were blisters and/or wrap injuries from Unna boots. I do not think the Unna boot's will provide adequate compression on the left. I am also not clear about the frequency he is using the compression pumps. 03/17/19 on evaluation today patient appears to be doing excellent in regard to his lower extremities compared to last week's evaluation apparently. He had gotten significantly worse last week which is unfortunate. The [employer] wrap on the left did not seem to do very well for him at all and in fact it didn't control his swelling significantly enough he had an additional outbreak. Subsequently we go back to the four layer compression wrap on the left. This is good news. At least in that he is doing better and the wound seem to be killing him. He still has not heard anything from the lymphedema clinic. 03/24/19 on evaluation today patient actually appears to be doing much better in regard to his bilateral lower Trinity as compared to last week when I saw him. Fortunately there's no signs of active infection at this time. He has been tolerating the  dressing changes without complication. Overall I'm extremely pleased with the progress and appearance in general. 04/07/19 on evaluation today patient appears to be doing well in regard to his bilateral lower extremities. His swelling is significantly down from where it was previous. With that being said he does have a couple blisters still open at this point but fortunately nothing that seems to be too severe and again the majority of the larger openings has healed at this time. 04/14/19 on evaluation today patient actually appears to be doing quite well in regard to his bilateral lower extremities in fact I'm not even sure there's anything significantly open at this time at any site. Nonetheless he did have some trouble with these wraps where they are somewhat irritating him secondary to the fact that he has noted that the graph wasn't too close down to the end of this foot in a little bit short as well up to his knee. Otherwise things seem to be doing quite well. 04/21/19 upon evaluation today patient's wound bed actually showed evidence of being completely healed in regard to both lower extremities which is excellent news. There does not appear to be any signs of active infection which is also good news. I'm very pleased in this regard. No fevers, chills, nausea, or vomiting noted at this time. 04/28/19 on evaluation today patient appears to be doing a little bit worse in regard to both lower extremities  on the left mainly due to the fact that when he went infection disease the wrap was not wrapped quite high enough he developed a blister above this. On the right he is a small open area of nothing too significant but again this is continuing to give him some trouble he has been were in the Velcro compression that he has at home. 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 He's been tolerating the dressing changes without complication.  Fortunately there's no signs of active infection at this time. No fevers, chills, nausea, or vomiting noted at this time. We have been trying to get an appointment with her lymphedema clinic in [facility] but unfortunately nobody can get them on phone with not been able to even fax information over the patient likewise is not been able to get in touch with them. Overall I'm not sure exactly what's going on here with to reach out again today. 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 Still having a lot of drainage unfortunately. He tells me especially in the left but even on the right. There's no signs of active infection which is good news we've been using so ratcheted up to this point. 05/19/19 on evaluation today patient actually appears to be doing quite well with regard to his left lower extremity which is great news. Fortunately in regard to the right lower extremity has an issues with his wrap and he subsequently did remove this from what I'm understanding. Nonetheless long story short is what he had rewrapped once he removed it subsequently had maggots underneath this wrap whenever he came in for evaluation today. With that being said they were obviously completely cleaned away by the nursing staff. The visit today which is excellent news. However he does appear to potentially have some infection around the right ankle region where the maggots were located as well. He will likely require anabiotic therapy today. 05/26/19 on evaluation today patient actually appears to be doing much better in regard to his bilateral lower extremities. I feel like the infection is under much better control. With that being said there were maggots noted when the wrap was removed yet again today. Again this could have potentially been left over from previous although at this time there does not appear to be any  signs of significant drainage there was obviously on the wrap some drainage as well this contracted gnats or otherwise. Either way I do not see anything that appears to be doing worse in my pinion and in fact I think his drainage has slowed down quite significantly likely mainly due to the fact to his infection being under better control. 06/02/2019 on evaluation today patient actually appears to be doing well with regard to his bilateral lower extremities there is no signs of active infection at this time which is great news. With that being said he does have several open areas more so on the right than the left but nonetheless these are all significantly better than previously noted. 06/09/2019 on evaluation today patient actually appears to be doing well. His wrap stayed up and he did not cause any problems he had more drainage on the right compared to the left but overall I do not see any major issues at this time which is great news. 06/16/2019 on evaluation today patient appears to be doing excellent with regard  to his lower extremities the only area that is open is a new blister that can have opened as of today on the medial ankle on the left. Other than this he really seems to be doing great I see no major issues at this point. 06/23/2019 on evaluation today patient appears to be doing quite well with regard to his bilateral lower extremities. In fact he actually appears to be almost completely healed there is a small area of weeping noted of the right lower extremity just above the ankle. Nonetheless fortunately there is no signs of active infection at this time which is good news. No fevers, chills, nausea, vomiting, or diarrhea. 8/24; the patient arrived for a nurse visit today but complained of very significant pain in the left leg and therefore I was asked to look at this. Noted that he did not have an open area on the left leg last week nevertheless this was wrapped. The patient states  that he is not been able to put his compression pumps on the left leg because of the discomfort. He has not been systemically unwell 06/30/2019 on evaluation today patient unfortunately despite being excellent last week is doing much worse with regard to his left lower extremity today. In fact he had to come in for a nurse on Monday where his left leg had to be rewrapped due to excessive weeping Dr. Leanord Hawking placed him on doxycycline at that point. Fortunately there is no signs of active infection Systemically at this time which is good news. 07/07/2019 in regard to the patient's wounds today he actually seems to be doing well with his right lower extremity there really is nothing open or draining at this point this is great news. Unfortunately the left lower extremity is given him additional trouble at this time. There does not appear to be any signs of active infection nonetheless he does have a lot of edema and swelling noted at this point as well as blistering all of which has led to a much more poor appearing leg at this time compared to where it was 2 weeks ago when it was almost completely healed. Obviously this is a little discouraging for the patient. He is try to contact the lymphedema clinic in [place] he has not been able to get through to them. 07/14/2019 on evaluation today patient actually appears to be doing slightly better with regard to his left lower extremity ulcers. Overall I do feel like at least at the top of the wrap that we have been placing this area has healed quite nicely and looks much better. The remainder of the leg is showing signs of improvement. Unfortunately in the thigh area he still has an open region on the left and again on the right he has been utilizing just a Band-Aid on an area that also opened on the thigh. Again this is an area that were not able to wrap although we did do an Ace wrap to provide some compression that something that obviously is a little  less effective than the compression wraps we have been using on the lower portion of the leg. He does have an appointment with the lymphedema clinic in [facility] on Friday. 07/21/2019 on evaluation today patient appears to be doing better with regard to his lower extremity ulcers. He has been tolerating the dressing changes without complication. Fortunately there is no signs of active infection at this time. No fevers, chills, nausea, vomiting, or diarrhea. I did receive the paperwork from the  physical therapist [place]. Subsequently I signed off on that this morning and sent that back to him for further progression with the treatment plan. 07/28/2019 on evaluation today patient appears to be doing very well with regard to his right lower extremity where I do not see any open wounds at this point. Fortunately he is feeling great as far as that is concerned as well. In regard to the left lower extremity he has been having issues with still several areas of weeping and edema although the upper leg is doing better his lower leg still I think is going require the compression wrap at this time. No fevers, chills, nausea, vomiting, or diarrhea. 08/04/2019 on evaluation today patient unfortunately is having new wounds on the right lower extremity. Again we have been using Unna boot wrap on that side. We switched him to using his juxta lite wrap at home. With that being said he tells me he has been using it although his legs extremely swollen and to be honest really does not appear that he has been. I cannot know that for sure however. Nonetheless he has multiple new wounds on the right lower extremity at this time. Obviously we will have to see about getting this rewrapped for him today. 08/11/2019 on evaluation today patient appears to be doing fairly well with regard to his wounds. He has been tolerating the dressing changes including the compression wraps without  complication. He still has a lot of edema in his upper thigh regions bilaterally he is supposed to be seeing the lymphedema clinic on the 15th of this month once his wraps arrive for the upper part of his legs. 08/18/2019 on evaluation today patient appears to be doing well with regard to his bilateral lower extremities at this point. He has been tolerating the dressing changes without complication. Fortunately there is no signs of active infection which is also good news. He does have a couple weeping areas on the first and second toe of the right foot he also has just a small area on the left foot upper leg and a small area on the left lower leg but overall he is doing quite well in my opinion. He is supposed to be getting his wraps shortly in fact tomorrow and then subsequently is seeing the lymphedema clinic next Wednesday on the 21st. Of note he is also leaving on the 25th to go on vacation for a week to the beach. For that reason and since there is some uncertainty about what there can be doing at lymphedema clinic next Wednesday I am get a make an appointment for next Friday here for us to see what we need to do for him prior to him leaving for vacation. 10/23; patient arrives in considerable pain predominantly in the upper posterior calf just distal to the popliteal fossa also in the wound anteriorly above the major wound. This is probably cellulitis and he has had this recurrently in the past. He has no open wound on the right side and he has had an Radio broadcast assistantUnna boot in that area. Finally I note that he has an area on the left posterior calf which by enlarge is mostly epithelialized. This protrudes beyond the borders of the surrounding skin in the setting of dry scaly skin and lymphedema. [place] Per his longstanding pattern, he will not take his compression pumps with him predominantly out of fear that they will be stolen. He  therefore asked that we put  a Unna boot back on the right leg. He will also contact the wound care center in [facility] to see if they can change his dressing in the mid week. 11/3; patient returned from his vacation to [facility] He was seen on 1 occasion at their wound care center. They did a 2 layer compression system as they did not have our 4-layer wrap. I am not completely certain what they put on the wounds. They did not change the Unna boot on the right. The patient is also seeing a lymphedema specialist physical therapist in [place]. It appears that he has some compression sleeve for his thighs which indeed look quite a bit better than I am used to seeing. He pumps over these with his external compression pumps. 11/10; the patient has a new wound on the right medial thigh otherwise there is no open areas on the right. He has an area on the left leg posteriorly anteriorly and medially and an area over the left second toe. We have been using silver alginate. He thinks the injury on his thigh is secondary to friction from the compression sleeve he has. 11/17; the patient has a new wound on the right medial thigh last week. He thinks this is because he did not have a underlying stocking for his thigh juxta lite apparatus. He now has this. The area is fairly large and somewhat angry but I do not think he has underlying cellulitis. He has a intact blister on the right anterior tibial area. Small wound on the right great toe dorsally Small area on the medial left calf. 11/30; the patient does not have any open areas on his right leg and we did not take his juxta lite stocking off. However he states that on Friday his compression wrap fell down lodging around his upper mid calf area. As usual this creates a lot of problems for him. He called urgently today to be seen for a nurse visit however the nurse visit turned into a provider visit because of extreme erythema and pain in the left anterior tibia extending  laterally and posteriorly. The area that is problematic is extensive 10/06/2019 upon evaluation today patient actually appears to be doing poorly in regard to his left lower extremity. He Dr. Leanord Hawking did place him on doxycycline this past Monday apparently due to the fact that he was doing much worse in regard to this left leg. Fortunately the doxycycline does seem to be helping. Unfortunately we are still having a very difficult time getting his edema under any type of control in order to anticipate discharge at some point. The only way were really able to control his lymphedema really is with compression wraps and that has only even seemingly temporary. He has been seeing a lymphedema clinic they are trying to help in this regard but still this has been somewhat frustrating in general for the patient. 10/13/19 on evaluation today patient appears to be doing excellent with regard to his right lower extremity as far as the wounds are concerned. His swelling is still quite extensive unfortunately. He is still having a lot of drainage from the thigh areas bilaterally which is unfortunate. He's been going to lymphedema clinic but again he still really does not have this edema under control as far as his lower extremities are concern. With regard to his left lower extremity this seems to be improving and I do believe the doxycycline has been of benefit for him.  He is about to complete the doxycycline. 10/20/2019 on evaluation today patient appears to be doing poorly in regard to his bilateral lower extremities. More in the right thigh he has a lot of irritation at this site unfortunately. In regard to the left lower extremity the wrap was not quite as high it appears and does seem to have caused him some trouble as well. Fortunately there is no evidence of systemic infection though he does have some blue-green drainage which has me concerned for the possibility of Pseudomonas. He tells me he is  previously taking Cipro without complications and he really does not care for Levaquin however due to some of the side effects he has. He is not allergic to any medications specifically antibiotics that were aware of. 10/27/2019 on evaluation today patient actually does appear to be for the most part doing better when compared to last week's evaluation. With that being said he still has multiple open wounds over the bilateral lower extremities. He actually forgot to start taking the Cipro and states that he still has the whole bottle. He does have several new blisters on left lower extremity today I think I would recommend he go ahead and take the Cipro based on what I am seeing at this point. Electronic Signature(s) Signed: 10/27/2019 11:04:47 AM By: Lenda Kelp PA-C Entered By: Lenda Kelp on 10/27/2019 11:04:46 -------------------------------------------------------------------------------- Physical Exam Details Patient Name: Date of Service: [name], [name] 10/27/2019 10:15 AM Medical Record [id number] Patient Account Number: [id number] Date of Birth/Sex: Treating RN: 1951/01/16 (68 y.o. M) Primary Care Provider: Nicoletta Ba Other Clinician: Referring Provider: Treating Provider/Extender:[name], [name], [name] in Treatment: 196 Constitutional Well-nourished and well-hydrated in no acute distress. Respiratory normal breathing without difficulty. clear to auscultation bilaterally. Cardiovascular regular rate and rhythm with normal S1, S2. Psychiatric this patient is able to make decisions and demonstrates good insight into disease process. Alert and Oriented x 3. pleasant and cooperative. Notes His wound beds currently showed signs for the most part of good granulation he does have some erythema however noted in general at this point which I think is going to continue to be an issue. I would recommend that he go ahead and take the Cipro that I  prescribed for him last week and then subsequently will continue with the compression wraps as we have before since he is making some progress in that regard. Electronic Signature(s) Signed: 10/27/2019 11:05:28 AM By: Lenda Kelp PA-C Entered By: Lenda Kelp on 10/27/2019 11:05:28 -------------------------------------------------------------------------------- Physician Orders Details Patient Name: Date of Service: [name], [name] 10/27/2019 10:15 AM Medical Record [id number] Patient Account Number: [id number] Date of Birth/Sex: Treating RN: 07/10/1951 (68 y.o. 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 Dressing Change Frequency Do not change entire dressing for one week. - both lower legs Wound #145 Right,Medial Upper Leg Change Dressing every other day. Wound #149 Left,Medial Upper Leg Change  Dressing every other day. Skin Barriers/Peri-Wound Care Wound #145 Right,Medial Upper Leg Barrier cream - 50/50 mix with Triamcinolone TCA Cream or Ointment - 50/50 mix with barrier cream Wound #153 Right,Medial Lower Leg TCA Cream or Ointment - mix with lotion right legs Wound #151 Left,Circumferential Lower Leg Antifungal cream - mix with zinc oxide and  TCA cream to leg Barrier cream TCA Cream or Ointment Wound Cleansing May shower with protection. Primary Wound Dressing Wound #145 Right,Medial Upper Leg Other: - Xtrasorb (use Zetuvit in clinic) Wound #149 Left,Medial Upper Leg Other: - Xtrasorb (use Zetuvit in clinic) Wound #151 Left,Circumferential Lower Leg Calcium Alginate with Silver Wound #152 Right Toe Second Calcium Alginate with Silver Wound #153 Right,Medial Lower Leg Calcium Alginate with Silver Secondary Dressing Wound #145 Right,Medial Upper Leg ABD pad - secure with tape Wound #149 Left,Medial Upper Leg ABD pad - secured with tape Other: - zetuvit Wound #151 Left,Circumferential Lower Leg ABD pad Other: - zetuvit Wound #152 Right Toe Second Kerlix/Rolled Gauze Drawtex Wound #153 Right,Medial Lower Leg ABD pad Edema Control 4 layer compression: Left lower extremity - unna boot at upper portion of lower leg. Unna Boot to Right Lower Extremity Avoid standing for long periods of time Elevate legs to the level of the heart or above for 30 minutes daily and/or when sitting, a frequency of: - throughout the day Exercise regularly Segmental Compressive Device. - lymphadema pumps 60 min 2 times per day Electronic Signature(s) Signed: 10/27/2019 4:50:49 PM By: Zenaida Deed RN, BSN Signed: 10/27/2019 4:54:22 PM By: Lenda Kelp PA-C Entered By: Zenaida Deed on 10/27/2019 11:08:57 -------------------------------------------------------------------------------- Problem List Details Patient Name: Date of Service: [name], [name] 10/27/2019 10:15 AM Medical Record [id number] Patient Account Number: [id number] Date of Birth/Sex: Treating RN: 15-Apr-1951 (68 y.o. M) Primary Care Provider: Nicoletta Ba Other Clinician: Referring Provider: Treating Provider/Extender:[name], [name], [name] in Treatment: 196 Active Problems ICD-10 Evaluated Encounter Code Description Active Date Today  Diagnosis L97.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 right calf limited to 06/30/2018 No Yes breakdown of skin L97.221 Non-pressure chronic ulcer of left calf limited to 09/30/2016 No Yes breakdown of skin I87.333 Chronic venous hypertension (idiopathic) with ulcer 01/22/2016 No Yes and inflammation of bilateral lower extremity I89.0 Lymphedema, not elsewhere classified 01/22/2016 No Yes E11.622 Type 2 diabetes mellitus with other skin ulcer 01/22/2016 No Yes 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, 01/22/2016 No Yes unspecified L03.116 Cellulitis of left lower limb 04/01/2017 No Yes Inactive Problems ICD-10 Code Description Active Date Inactive Date 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 06/30/2017 skin L97.521 Non-pressure chronic ulcer of other part of left foot limited to 04/27/2018 04/27/2018 breakdown of skin L03.115 Cellulitis of right lower limb 12/22/2017 12/22/2017 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 with other specified 06/30/2018 06/30/2018 severity L97.511 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 06/30/2018 06/30/2018 breakdown of skin Resolved Problems Electronic Signature(s) Signed: 10/27/2019 10:51:05 AM By: Lenda Kelp PA-C Entered By: Lenda Kelp on 10/27/2019 10:51:03 -------------------------------------------------------------------------------- Progress Note Details Patient Name: Date of Service: [name], [name] 10/27/2019 10:15 AM Medical Record [id number] Patient Account Number: [id number] Date of Birth/Sex: Treating RN: [birth year] (68 y.o. M) Primary Care Provider: Nicoletta Ba Other Clinician: Referring Provider: Treating Provider/Extender:[name], [name], [name] in Treatment: 196 Subjective Chief Complaint Information obtained from Patient patient is here for evaluation venous/lymphedema weeping History of Present Illness (HPI) Referred  by PCP for consultation. Patient has  long standing history of BLE venous stasis, no prior ulcerations. At beginning of month, developed cellulitis and weeping. Received IM Rocephin followed by Keflex and resolved. Wears compression stocking, appr 6 months old. Not sure strength. No present drainage. 01/22/16 this is a patient who is a type II diabetic on insulin. 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 He tells me he religiously wears pressure stockings of uncertain strength. He was here with weeping edema about 8 months ago but did not have an open wound. Roughly a month ago he had a reopening on his bilateral legs. He is been using bandages and Neosporin. He does not complain of pain. 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 Last BUN/creatinine I have is from 11/20/15 at 13 and 1.0 respectively 01/29/16; patient arrives today having tolerated the Profore wrap. He brought in his stockings and these are 18 mmHg stockings he bought from [place]. The compression here is likely inadequate. He does not complain of pain or excessive drainage she has no systemic symptoms. The wound on the right looks improved as does the one on the left although one on the left is more substantial with still tissue at risk below the actual wound area on the bilateral posterior calf 02/05/16; patient arrives with poor edema control. He states that we did put a 4 layer compression on it last week. No weight appear 5 this. 02/12/16; the area on the posterior right Has healed. The left Has a substantial wound that has necrotic surface eschar that requires a debridement with a curette. 02/16/16;the patient called or a Nurse visit secondary to increased swelling. He had been in earlier in the week with his right leg healed. He was transitioned to is on pressure stocking on the right leg  with the only open wound on the left, a substantial area on the left posterior calf. Note he has a history of severe lower extremity edema, he has a history of chronic atrial fibrillation but not heart failure per my notes but I'll need to research this. He is not complaining of chest pain shortness of breath or orthopnea. The intake nurse noted blisters on the previously closed right leg 02/19/16; this is the patient's regular visit day. I see him on Friday with escalating edema new wounds on the right leg and clear signs of at least right ventricular heart failure. I increased his Lasix to 40 twice a day. He is returning currently in follow-up. States he is noticed a decrease in that the edema 02/26/16 patient's legs have much less edema. There is nothing really open on the right leg. The left leg has improved condition of the large superficial wound on the posterior left leg 03/04/16; edema control is very much better. The patient's right leg wounds have healed. On the left leg he continues to have severe venous inflammation on the posterior aspect of the left leg. There is no tenderness and I don't think any of this is cellulitis. 03/11/16; patient's right leg is married healed and he is in his own stocking. The patient's left leg has deteriorated somewhat. There is a lot of erythema around the wound on the posterior left leg. There is also a significant rim of erythema posteriorly just above where the wrap would've ended there is a new wound in this location and a lot of tenderness. Can't rule out cellulitis in this  area. 03/15/16; patient's right leg remains healed and he is in his own stocking. The patient's left leg is much better than last review. His major wound on the posterior aspect of his left Is almost fully epithelialized. He has 3 small injuries from the wraps. Really. Erythema seems a lot better on antibiotics 03/18/16; right leg remains healed and he is in his own stocking. The  patient's left leg is much better. The area on the posterior aspect of the left calf is fully epithelialized. His 3 small injuries which were wrap injuries on the left are improved only one seems still open his erythema has resolved 03/25/16; patient's right leg remains healed and he is in his own stocking. There is no open area today on the left leg posterior leg is completely closed up. His wrap injuries at the superior aspect of his leg are also resolved. He looks as though he has some irritation on the dorsal ankle but this is fully epithelialized without evidence of infection. 03/28/16; we discharged this patient on Monday. Transitioned him into his own stocking. There were problems almost immediately with uncontrolled swelling weeping edema multiple some of which have opened. He does not feel systemically unwell in particular no chest pain no shortness of breath and he does not feel 04/08/16; the edema is under better control with the Profore light wrap but he still has pitting edema. There is one large wound anteriorly 2 on the medial aspect of his left leg and 3 small areas on the superior posterior calf. Drainage is not excessive he is tolerating a Profore light well 04/15/16; put a Profore wrap on him last week. This is controlled is edema however he had a lot of pain on his left anterior foot most of his wounds are healed 04/22/16 once again the patient has denuded areas on the left anterior foot which he states are because his wrap slips up word. He saw his primary physician today is on Lasix 40 twice a day and states that he his weight is down 20 pounds over the last 3 months. 04/29/16: Much improved. left anterior foot much improved. He is now on Lasix 80 mg per day. Much improved edema control 05/06/16; I was hoping to be able to discharge him today however once again he has blisters at a low level of where the compression was placed last week mostly on his left lateral but also his left  medial leg and a small area on the anterior part of the left foot. 05/09/16; apparently the patient went home after his appointment on 7/4 later in the evening developing pain in his upper medial thigh together with subjective fever and chills although his temperature was not taken. The pain was so intense he felt he would probably have to call 911. However he then remembered that he had leftover doxycycline from a previous round of antibiotics and took these. By the next morning he felt a lot better. He called and spoke to one of our nurses and I approved doxycycline over the phone thinking that this was in relation to the wounds we had previously seen although they were definitely were not. The patient feels a lot better old fever no chills he is still working. Blood sugars are reasonably controlled 05/13/16; patient is back in for review of his cellulitis on his anterior medial upper thigh. He is taking doxycycline this is a lot better. Culture I did of the nodular area on the dorsal aspect of his foot grew  MRSA this also looks a lot better. 05/20/16; the patient is cellulitis on the medial upper thigh has resolved. All of his wound areas including the left anterior foot, areas on the medial aspect of the left calf and the lateral aspect of the calf at all resolved. He has a new blister on the left dorsal foot at the level of the fourth toe this was excised. No evidence of infection 05/27/16; patient continues to complain weeping edema. He has new blisterlike wounds on the left anterior lateral and posterior lateral calf at the top of his wrap levels. The area on his left anterior foot appears better. He is not complaining of fever, pain or pruritus in his feet. 05/30/16; the patient's blisters on his left anterior leg posterior calf all look improved. He did not increase the Lasix 100 mg as I suggested because he was going to run out of his 40 mg tablets. He is still having weeping edema of his  toes 06/03/16; I renewed his Lasix at 80 mg once a day as he was about to run out when I last saw him. He is on 80 mg of Lasix now. I have asked him to cut down on the excessive amount of water he was drinking and asked him to drink according to his thirst mechanisms 06/12/2016 -- was seen 2 days ago and was supposed to wear his compression stockings at home but he is developed lymphedema and superficial blisters on the left lower extremity and hence came in for a review 06/24/16; the remaining wound is on his left anterior leg. He still has edema coming from between his toes. There is lymphedema here however his edema is generally better than when I last saw this. He has a history of atrial fibrillation but does not have a known history of congestive heart failure nevertheless I think he probably has this at least on a diastolic basis. 07/01/16 I reviewed his echocardiogram from January 2017. This was essentially normal. He did not have LVH, EF of 55-60%. His right ventricular function was normal although he did have trivial tricuspid and pulmonic regurgitation. This is not audible on exam however. I increased his Lasix to do massive edema in his legs well above his knees I think in early July. He was also drinking an excessive amount of water at the time. 07/15/16; missed his appointment last week because of the Labor Day holiday on Monday. He could not get another appointment later in the week. Started to feel the wrap digging in superiorly so we remove the top half and the bottom half of his wrap. He has extensive erythema and blistering superiorly in the left leg. Very tender. Very swollen. Edema in his foot with leaking edema fluid. He has not been systemically unwell 07/22/16; the area on the left leg laterally required some debridement. The medial wounds look more stable. His wrap injury wounds appear to have healed. Edema and his foot is better, weeping edema is also better. He tells me he  is meeting with the supplier of the external compression pumps at work 08/05/16; the patient was on vacation last week in [facility]. His wrap is been on for an extended period of time. Also over the weekend he developed an extensive area of tender erythema across his anterior medial thigh. He took to doxycycline yesterday that he had leftover from a previous prescription. The patient complains of weeping edema coming out of his toes 08/08/16; I saw this patient on 10/2. He  was tender across his anterior thigh. I put him on doxycycline. He returns today in follow-up. He does not have any open wounds on his lower leg, he still has edema weeping into his toes. 08/12/16; patient was seen back urgently today to follow-up for his extensive left thigh cellulitis/erysipelas. He comes back with a lot less swelling and erythema pain is much better. I believe I gave him Augmentin and Cipro. His wrap was cut down as he stated a roll down his legs. He developed blistering above the level of the wrap that remained. He has 2 open blisters and 1 intact. 08/19/16; patient is been doing his primary doctor who is increased his Lasix from 40-80 once a day or 80 already has less edema. Cellulitis has remained improved in the left thigh. 2 open areas on the posterior left calf 08/26/16; he returns today having new open blisters on the anterior part of his left leg. He has his compression pumps but is not yet been shown how to use some vital representative from the supplier. 09/02/16 patient returns today with no open wounds on the left leg. Some maceration in his plantar toes 09/10/2016 -- Dr. Leanord Hawking had recently discharged him on 09/02/2016 and he has come right back with redness swelling and some open ulcers on his left lower extremity. He says this was caused by trying to apply his compression stockings and he's been unable to use this and has not been able to use his lymphedema pumps. He had some doxycycline  leftover and he has started on this a few days ago. 09/16/16; there are no open wounds on his leg on the left and no evidence of cellulitis. 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 He does not complain of symptoms here. I am not clear use using his external compression pumps. 09/23/16; I have not seen this patient in 2 weeks. He canceled his appointment 10 days ago as he was going on vacation. He tells me that on Monday he noticed a large area on his posterior left leg which is been draining copiously and is reopened into a large wound. He is been using ABDs and the external part of his juxtalite, according to our nurse this was not on properly. 10/07/16; Still a substantial area on the posterior left leg. Using silver alginate 10/14/16; in general better although there is still open area which looks healthy. Still using silver alginate. He reminds me that this happen before he left for [facility]. Today while he was showering in the morning. He had been using his juxtalite's 10/21/16; the area on his posterior left leg is fully epithelialized. However he arrives today with a large area of tender erythema in his medial and posterior left thigh just above the knee. I have marked the area. Once again he is reluctant to consider hospitalization. I treated him with oral antibiotics in the past for a similar situation with resolution I think with doxycycline however this area it seems more extensive to me. He is not complaining of fever but does have chills and says states he is thirsty. His blood sugar today was in the 140s at home 10/25/16 the area on his posterior left leg is fully epithelialized although there is still some weeping edema. The large area of tenderness and erythema in his medial and posterior left thigh is a lot less tender although there is still a lot of swelling in this thigh. He states he  feels a lot better. He is on  doxycycline and Augmentin that I started last week. This will continued until Tuesday, December 26. I have ordered a duplex ultrasound of the left thigh rule out DVT whether there is an abscess something that would need to be drained I would also like to know. 11/01/16; he still has weeping edema from a not fully epithelialized area on his left posterior calf. Most of the rest of this looks a lot better. He has completed his antibiotics. His thigh is a lot better. Duplex ultrasound did not show a DVT in the thigh 11/08/16; he comes in today with more Denuded surface epithelium from the posterior aspect of his calf. There is no real evidence of cellulitis. The superior aspect of his wrap appears to have put quite an indentation in his leg just below the knee and this may have contributed. He does not complain of pain or fever. We have been using silver alginate as the primary dressing. The area of cellulitis in the right thigh has totally resolved. He has been using his compression stockings once a week 11/15/16; the patient arrives today with more loss of epithelium from the posterior aspect of his left calf. He now has a fairly substantial wound in this area. The reason behind this deterioration isn't exactly clear although his edema is not well controlled. He states he feels he is generally more swollen systemically. He is not complaining of chest pain shortness of breath fever. Tells me he has an appointment with his primary physician in early February. He is on 80 mg of oral Lasix a day. He claims compliance with the external compression pumps. He is not having any pain in his legs similar to what he has with his recurrent cellulitis 11/22/16; the patient arrives a follow-up of his large area on his left lateral calf. This looks somewhat better today. He came in earlier in the week for a dressing change since I saw him a week ago. He is not complaining of any pain no shortness of breath no chest  pain 11/28/16; the patient arrives for follow-up of his large area on the left lateral calf this does not look better. In fact it is larger weeping edema. The surface of the wound does not look too bad. We have been using silver alginate although I'm not certain that this is a dressing issue. 12/05/16; again the patient follows up for a large wound on the left lateral and left posterior calf this does not look better. There continues to be weeping edema necrotic surface tissue. More worrisome than this once again there is erythema below the wound involving the distal Achilles and heel suggestive of cellulitis. He is on his feet working most of the day of this is not going well. We are changing his dressing twice a week to facilitate the drainage. 12/12/16; not much change in the overall dimensions of the large area on the left posterior calf. This is very inflamed looking. I gave him an. Doxycycline last week does not really seem to have helped. He found the wrap very painful indeed it seems to of dog into his legs superiorly and perhaps around the heel. He came in early today because the drainage had soaked through his dressings. 12/19/16- patient arrives for follow-up evaluation of his left lower extremity ulcers. He states that he is using his lymphedema pumps once daily when there is "no drainage". He admits to not using his lipedema pumps while under current treatment.  His blood sugars have been consistently between 150-200. 12/26/16; the patient is not using his compression pumps at home because of the wetness on his feet. I've advised him that I think it's important for him to use this daily. He finds his feet too wet, he can put a plastic bag over his legs while he is in the pumps. Otherwise I think will be in a vicious circle. We are using silver alginate to the major area on his left posterior calf 01/02/17; the patient's posterior left leg has further of all into 3 open wounds. All of them  covered with a necrotic surface. He claims to be using his compression pumps once a day. His edema control is marginal. Continue with silver alginate 01/10/17; the patient's left posterior leg actually looks somewhat better. There is less edema, less erythema. Still has 3 open areas covered with a necrotic surface requiring debridement. He claims to be using his compression pumps once a day his edema control is better 01/17/17; the patient's left posterior calf look better last week when I saw him and his wrap was changed 2 days ago. He has noted increasing pain in the left heel and arrives today with much larger wounds extensive erythema extending down into the entire heel area especially tender medially. He is not systemically unwell CBGs have been controlled no fever. Our intake nurse showed me limegreen drainage on his AVD pads. 01/24/17; his usual this patient responds nicely to antibiotics last week giving him Levaquin for presumed Pseudomonas. The whole entire posterior part of his leg is much better much less inflamed and in the case of his Achilles heel area much less tender. He has also had some epithelialization posteriorly there are still open areas here and still draining but overall considerably better 01/31/17- He has continue to tolerate the compression wraps. he states that he continues to use the lymphedema pumps daily, and can increase to twice daily on the weekends. He is voicing no complaints or concerns regarding his LLE ulcers 02/07/17-he is here for follow-up evaluation. He states that he noted some erythema to the left medial and anterior thigh, which he states is new as of yesterday. He is concerned about recurrent cellulitis. He states his blood sugars have been slightly elevated, this morning in the 180s 02/14/17; he is here for follow-up evaluation. When he was last here there was erythema superiorly from his posterior wound in his anterior thigh. He was prescribed Levaquin  however a culture of the wound surface grew MRSA over the phone I changed him to doxycycline on Monday and things seem to be a lot better. 02/24/17; patient missed his appointment on Friday therefore we changed his nurse visit into a physician visit today. Still using silver alginate on the large area of the posterior left thigh. He isn't new area on the dorsal left second toe 03/03/17; actually better today although he admits he has not used his external compression pumps in the last 2 days or so because of work responsibilities over the weekend. 03/10/17; continued improvement. External compression pumps once a day almost all of his wounds have closed on the posterior left calf. Better edema control 03/17/17; in general improved. He still has 3 small open areas on the lateral aspect of his left leg however most of the area on the posterior part of his leg is epithelialized. He has better edema control. He has an ABD pad under his stocking on the right anterior lower leg although he did not  let us look at that today. 03/24/17; patient arrives back in clinic today with no open areas however there are areas on the posterior left calf and anterior left calf that are less than 100% epithelialized. His edema is well controlled in the left lower leg. There is some pitting edema probably lymphedema in the left upper thigh. He uses compression pumps at home once per day. I tried to get him to do this twice a day although he is very reticent. 04/01/2017 -- for the last 2 days he's had significant redness, tenderness and weeping and came in for an urgent visit today. 04/07/17; patient still has 6 more days of doxycycline. He was seen by Dr. Meyer Russel last Wednesday for cellulitis involving the posterior aspect, lateral aspect of his Involving his heel. For the most part he is better there is less erythema and less weeping. He has been on his feet for 12 hours o2 over the weekend. Using his compression pumps once a  day 04/14/17 arrives today with continued improvement. Only one area on the posterior left calf that is not fully epithelialized. He has intense bilateral venous inflammation associated with his chronic venous insufficiency disease and secondary lymphedema. We have been using silver alginate to the left posterior calf wound In passing he tells [place] today that the right leg but we have not seen in quite some time has an open area on it but he doesn't want [place] to look at this today states he will show this to [place] next week. 04/21/17; there is no open area on his left leg although he still reports some weeping edema. He showed [place] his right leg today which is the first time we've seen this leg in a long time. He has a large area of open wound on the right leg anteriorly healthy granulation. Quite a bit of swelling in the right leg and some degree of venous inflammation. He told us about the right leg in passing last week but states that deterioration in the right leg really only happened over the weekend 04/28/17; there is no open area on the left leg although there is an irritated part on the posterior which is like a wrap injury. The wound on the right leg which was new from last week at least to [place] is a lot better. 05/05/17; still no open area on the left leg. Patient is using his new compression stocking which seems to be doing a good job of controlling the edema. He states he is using his compression pumps once per day. The right leg still has an open wound although it is better in terms of surface area. Required debridement. A lot of pain in the posterior right Achilles marked tenderness. Usually this type of presentation this patient gives concern for an active cellulitis 05/12/17; patient arrives today with his major wound from last week on the right lateral leg somewhat better. Still requiring debridement. He was using his compression stocking on the left leg however that is reopened with superficial  wounds anteriorly he did not have an open wound on this leg previously. He is still using his juxta light's once daily at night. He cannot find the time to do this in the morning as he has to be at work by 7 AM 05/19/17; right lateral leg wound looks improved. No debridement required. The concerning area is on the left posterior leg which appears to almost have a subcutaneous hemorrhagic component to it. We've been using silver alginate to all  the wounds 05/26/17; the right lateral leg wound continues to look improved. However the area on the left posterior calf is a tightly adherent surface. [place] using silver alginate. Because of the weeping edema in his legs there is very little good alternatives. 06/02/17; the patient left here last week looking quite good. Major wound on the left posterior calf and a small one on the right lateral calf. Both of these look satisfactory. He tells me that by Wednesday he had noted increased pain in the left leg and drainage. He called on Thursday and Friday to get an appointment here but we were blocked. He did not go to urgent care or his primary physician. He thinks he had a fever on Thursday but did not actually take his temperature. He has not been using his compression pumps on the left leg because of pain. I advised him to go to the emergency room today for IV antibiotics for stents of left leg cellulitis but he has refused I have asked him to take 2 days off work to keep his leg elevated and he has refused this as well. In view of this I'm going to call him and Augmentin and doxycycline. 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 However after a detailed discussion regarding the risk benefits and alternatives he  refuses to get admitted to the hospital. With no other recourse I will continue him on oral antibiotics as before and hopefully he'll have his infectious disease consultation this week. 06/16/2017 -- the patient was seen today by the nurse practitioner at infectious disease Ms. Dixon. 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 He has also been advise Lotrimin cream locally. she also had wise IV antibiotics for his condition if it flares up 06/23/17; patient arrives today with drainage bilaterally although the remaining wound on the left posterior calf after cleaning up today "highlighter yellow drainage" did not look too bad. Unfortunately he has had breakdown on the right anterior leg [previously this leg had not been open and he is using a black stocking] he went to see infectious disease and is been put on clindamycin 150 mg daily, I did not verify the dose although I'm not familiar with using clindamycin in this dosing range, perhaps for prophylaxisoo 06/27/17; I brought this patient back today to follow-up on the wound deterioration on the right lower leg together with surrounding cellulitis. I started him on doxycycline 4 days ago. This area looks better however he comes in today with intense cellulitis on the medial part of his left thigh. This is not have a wound in this area. Extremely tender. We've been using silver alginate to the wounds on the right lower leg left lower leg with bilateral 4 layer compression he is using his external compression pumps once a day 07/04/17; patient's left medial thigh cellulitis looks better. He has not been using his compression pumps as his insert said it was contraindicated with cellulitis. His right leg continues to make improvements all the wounds are still open. We only  have one remaining wound on  the left posterior calf. Using silver alginate to all open areas. He is on doxycycline which I started a week ago and should be finishing I gave him Augmentin after Thursday's visit for the severe cellulitis on the left medial thigh which fortunately looks better 07/14/17; the patient's left medial thigh cellulitis has resolved. The cellulitis in his right lower calf on the right also looks better. All of his wounds are stable to improved we've been using silver alginate he has completed the antibiotics I have given him. He has clindamycin 150 mg once a day prescribed by infectious disease for prophylaxis, I've advised him to start this now. We have been using bilateral Unna boots over silver alginate to the wound areas 07/21/17; the patient is been to see infectious disease who noted his recurrent problems with cellulitis. He was not able to tolerate prophylactic clindamycin therefore he is on amoxicillin 500 twice a day. He also had a second daily dose of Lasix added By Dr. Oneta Rack but he is not taking this. Nor is he being completely compliant with his compression pumps a especially not this week. He has 2 remaining wounds one on the right posterior lateral lower leg and one on the left posterior medial lower leg. 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 The patient has Unna boots bilaterally. 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 He has bilateral Unna boots for compression and silver alginate to his wounds. Arrives today with his legs looking as good as I have seen him in quite some time. Not surprisingly his wounds look better as well with improvement on the right lateral leg venous insufficiency wound and also the left medial leg. He is  still using the compression pumps once a day 08/11/17; both legs appear to be doing better wounds on the right lateral and left medial legs look better. Skin on the right leg quite good. He is been using silver alginate as the primary dressing. I'm going to use Anasept gel calcium alginate and maintain all the secondary dressings 08/18/17; the patient continues to actually do quite well. The area on his right lateral leg is just about closed the left medial also looks better although it is still moist in this area. His edema is well controlled we have been using Anasept gel with calcium alginate and the usual secondary dressings, 4 layer compression and once daily use of his compression pumps "always been able to manage 09/01/17; the patient continues to do reasonably well in spite of his trip to [place]. The area on the right lateral leg is epithelialized. Left is much better but still open. He has more edema and more chronic erythema on the left leg [venous inflammation] 09/08/17; he arrives today with no open wound on the right lateral leg and decently controlled edema. Unfortunately his left leg is not nearly as in his good situation as last week.he apparently had increasing edema starting on Saturday. He edema soaked through into his foot so used a plastic bag to walk around his home. The area on the medial right leg which was his open area is about the same however he has lost surface epithelium on the left lateral which is new and he has significant pain in the Achilles area of the left foot. He is already  on amoxicillin chronically for prophylaxis of cellulitis in the left leg 09/15/17; he is completed a week of doxycycline and the cellulitis in the left posterior leg and Achilles area is as usual improved. He still has a lot of edema and fluid soaking through his dressings. There is no open wound on the right leg. He saw infectious disease NP today 09/22/17;As usual 1 we transition him  from our compression wraps to his stockings things did not go well. He has several small open areas on the right leg. He states this was caused by the compression wrap on his skin although he did not wear this with the stockings over them. He has several superficial areas on the left leg medially laterally posteriorly. He does not have any evidence of active cellulitis especially involving the left Achilles The patient is traveling from [facility] Saturday going to [facility]. He states he isn't attempting to get an appointment with a heel objects wound center there to change his dressings. I am not completely certain whether this will work 10/06/17; the patient came in on Friday for a nurse visit and the nurse reported that his legs actually look quite good. He arrives in clinic today for his regular follow-up visit. He has a new wound on his left third toe over the PIP probably caused by friction with his footwear. He has small areas on the left leg and a very superficial but epithelialized area on the right anterior lateral lower leg. Other than that his legs look as good as I've seen him in quite some time. We have been using silver alginate Review of systems; no chest pain no shortness of breath other than this a 10 point review of systems negative 10/20/17; seen by Dr. Meyer Russel last week. He had taken some antibiotics [doxycycline] that he had left over. Dr. Meyer Russel thought he had candida infection and declined to give him further antibiotics. He has a small wound remaining on the right lateral leg several areas on the left leg including a larger area on the left posterior several left medial and anterior and a small wound on the left lateral. The area on the left dorsal third toe looks a lot better. ROS; Gen.; no fever, respiratory no cough no sputum Cardiac no chest pain other than this 10 point review of system is negative 10/30/17; patient arrives today having fallen in the bathtub 3 days  ago. It took him a while to get up. He has pain and maceration in the wounds on his left leg which have deteriorated. He has not been using his pumps he also has some maceration on the right lateral leg. 11/03/17; patient continues to have weeping edema especially in the left leg. This saturates his dressings which were just put on on 12/27. As usual the doxycycline seems to take care of the cellulitis on his lower leg. He is not complaining of fever, chills, or other systemic symptoms. He states his leg feels a lot better on the doxycycline I gave him empirically. He also apparently gets injections at his primary doctor's officeo Rocephin for cellulitis prophylaxis. I didn't ask him about his compression pump compliance today I think that's probably marginal. Arrives in the clinic with all of his dressings primary and secondary macerated full of fluid and he has bilateral edema 11/10/17; the patient's right leg looks some better although there is still a cluster of wounds on the right lateral. The left leg is inflamed with almost circumferential skin loss medially to  laterally although we are still maintaining anteriorly. He does not have overt cellulitis there is a lot of drainage. He is not using compression pumps. We have been using silver alginate to the wound areas, there are not a lot of options here 11/17/17; the patient's right leg continues to be stable although there is still open wounds, better than last week. The inflammation in the left leg is better. Still loss of surface layer epithelium especially posteriorly. There is no overt cellulitis in the amount of edema and his left leg is really quite good, tells me he is using his compression pumps once a day. 11/24/17; patient's right leg has a small superficial wound laterally this continues to improve. The inflammation in the left leg is still improving however we have continuous surface layer epithelial loss posteriorly. There is no  overt cellulitis in the amount of edema in both legs is really quite good. He states he is using his compression pumps on the left leg once a day for 5 out of 7 days 12/01/17; very small superficial areas on the right lateral leg continue to improve. Edema control in both legs is better today. He has continued loss of surface epithelialization and left posterior calf although I think this is better. We have been using silver alginate with large number of absorptive secondary dressings 4 layer on the left Unna boot on the right at his request. He tells me he is using his compression pumps once a day 12/08/17; he has no open area on the right leg is edema control is good here. ooOn the left leg however he has marked erythema and tenderness breakdown of skin. He has what appears to be a wrap injury just distal to the popliteal fossa. 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 There was swelling erythema and breakdown in the right leg. He has much worse skin breakdown in the left leg as well multiple open areas medially and posteriorly as well as laterally. He tells me he has been using his compression pumps but tells me he feels that the drainage out of his leg is worse when he uses a compression pumps. To be fair to him he is been saying this for a while however I don't know that I have really been listening to this. I wonder if the compression pumps are working properly 12/22/17;. Once again he arrives with severe erythema, weeping edema from the left greater than right leg. Noncompliance with compression pumps. New this visit he is complaining of pain on the lateral aspect of the right leg and the medial aspect of his right  thigh. He apparently saw his cardiologist Dr. [name] who was ordered an echocardiogram area and I think this is a step in the right direction 12/25/17; started his doxycycline Monday night. There is still intense erythema of the right leg especially in the anterior thigh although there is less tenderness. The erythema around the wound on the right lateral calf also is less tender. He still complaining of pain in the left heel. His wounds are about the same right lateral left medial left lateral. Superficial but certainly not close to closure. He denies being systemically unwell no fever chills no abdominal pain no diarrhea 12/29/17;  back in follow-up of his extensive right calf and right thigh cellulitis. I added amoxicillin to cover possible doxycycline resistant strep. This seems to of done the trick he is in much less pain there is much less erythema and swelling. He has his echocardiogram at 11:00 this morning. X-ray of the left heel was also negative. 01/05/18; the patient arrived with his edema under much better control. Now that he is retired he is able to use his compression pumps daily and sometimes twice a day per the patient. He has a wound on the right leg the lateral wound looks better. Area on the left leg also looks a lot better. He has no evidence of cellulitis in his bilateral thighs I had a quick peak at his echocardiogram. He is in normal ejection fraction and normal left ventricular function. He has moderate pulmonary hypertension moderately reduced right ventricular function. One would have to wonder about chronic sleep apnea although he says he doesn't snore. He'll review the echocardiogram with his cardiologist. 01/12/18; the patient arrives with the edema in both legs under exemplary control. He is using his compression pumps daily and sometimes twice daily. His wound on the right lateral leg is just about closed. He still has some weeping areas on the posterior left calf and  lateral left calf although everything is just about closed here as well. I have spoken with Aldean Baker who is the patient's nurse practitioner and infectious disease. She was concerned that the patient had not understood that the parenteral penicillin injections he was receiving for cellulitis prophylaxis was actually benefiting him. I don't think the patient actually saw that I would tend to agree we were certainly dealing with less infections although he had a serious one last month. 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 He is healed. He'll be placed in juxtalite compression wraps and increase his lymphedema pumps to twice daily. We will follow up again next week to ensure there are no issues with the new regiment. 01/20/18-he is here for evaluation of bilateral lower extremity weeping edema. Yesterday he was placed in compression wrap to the right lower extremity and compression stocking to left lower shrubbery. He states he uses lymphedema pumps last night and again this morning and noted a blister to the left lower extremity. On exam he was noted to have drainage to the right lower extremity. He will be placed in Unna boots bilaterally and follow-up next week 01/26/18; patient was actually discharged a week ago to his own juxta light stockings only to return the next day with bilateral lower extremity weeping edema.he was placed in bilateral Unna boots. He arrives today with pain in the back of his left leg. There is no open area on the right leg however there is a linear/wrap injury on the left leg and weeping edema on the left leg posteriorly. I spoke with infectious disease about 10 days ago. They were disappointed that the patient elected to discontinue prophylactic intramuscular penicillin shots as they felt it was particularly beneficial in reducing the frequency of his cellulitis. I discussed this with the patient today. He does not share  this view. He'll definitely need antibiotics today. Finally he is traveling to [place] and trauma leaving this Saturday and returning a week later and he does not travel with his pumps. He is going by car 01/30/18; patient was seen 4 days ago and brought back in today for review of cellulitis in the left leg posteriorly.  I put him on amoxicillin this really hasn't helped as much as I might like. He is also worried because he is traveling to [facility] trauma by car. Finally we will be rewrapping him. There is no open area on the right leg over his left leg has multiple weeping areas as usual 02/09/18; The same wrap on for 10 days. He did not pick up the last doxycycline I prescribed for him. He apparently took 4 days worth he already had. There is nothing open on his right leg and the edema control is really quite good. He's had damage in the left leg medially and laterally especially probably related to the prolonged use of Unna boots 02/12/18; the patient arrived in clinic today for a nurse visit/wrap change. He complained of a lot of pain in the left posterior calf. He is taking doxycycline that I previously prescribed for him. Unfortunately even though he used his stockings and apparently used to compression pumps twice a day he has weeping edema coming out of the lateral part of his right leg. This is coming from the lower anterior lateral skin area. 02/16/18; the patient has finished his doxycycline and will finish the amoxicillin 2 days. The area of cellulitis in the left calf posteriorly has resolved. He is no longer having any pain. He tells me he is using his compression pumps at least once a day sometimes twice. 02/23/18; the patient finished his doxycycline and Amoxil last week. On Friday he noticed a small erythematous circle about the size of a quarter on the left lower leg just above his ankle. This rapidly expanded and he now has erythema on the lateral and posterior part of the thigh.  This is bright red. Also has an area on the dorsal foot just above his toes and a tender area just below the left popliteal fossa. He came off his prophylactic penicillin injections at his own insistence one or 2 months ago. This is obviously deteriorated since then 03/02/18; patient is on doxycycline and Amoxil. Culture I did last week of the weeping area on the back of his left calf grew group B strep. I have therefore renewed the amoxicillin 500 3 times a day for a further week. He has not been systemically unwell. Still complaining of an area of discomfort right under his left popliteal fossa. There is no open wound on the right leg. He tells me that he is using his pumps twice a day on most days 03/09/18; patient arrives in clinic today completing his amoxicillin today. The cellulitis on his left leg is better. Furthermore he tells me that he had intramuscular penicillin shots that his primary care office today. However he also states that the wrap on his right leg fell down shortly after leaving clinic last week. He developed a large blister that was present when he came in for a nurse visit later in the week and then he developed intense discomfort around this area.He tells me he is using his compression pumps 03/16/18; the patient has completed his doxycycline. The infectious part of this/cellulitis in the left heel area left popliteal area is a lot better. He has 2 open areas on the right calf. Still areas on the left calf but this is a lot better as well. 03/24/18; the patient arrives complaining of pain in the left popliteal area again. He thinks some of this is wrap injury. He has no open area on the right leg and really no open area on the left calf  either except for the popliteal area. He claims to be compliant with the compression pumps 03/31/18; I gave him doxycycline last week because of cellulitis in the left popliteal area. This is a lot better although the surface epithelium is  denuded off and response to this. He arrives today with uncontrolled edema in the right calf area as well as a fingernail injury in the right lateral calf. There is only a few open areas on the left 04/06/18; I gave him amoxicillin doxycycline over the last 2 weeks that the amoxicillin should be completing currently. He is not complaining of any pain or systemic symptoms. The only open areas see has is on the right lateral lower leg paradoxically I cannot see anything on the left lower leg. He tells me he is using his compression pumps twice a day on most days. Silver alginate to the wounds that are open under 4 layer compression 04/13/18; he completed antibiotics and has no new complaints. Using his compression pumps. Silver alginate that anything that's opened 04/20/18; he is using his compression pumps religiously. Silver alginate 4 layer compression anything that's opened. He comes in today with no open wounds on the left leg but 3 on the right including a new one posteriorly. He has 2 on the right lateral and one on the right posterior. He likes Unna boots on the right leg for reasons that aren't really clear we had the usual 4 layer compression on the left. It may be necessary to move to the 4 layer compression on the right however for now I left them in the Unna boots 04/27/18; he is using his compression pumps at least once a day. He has still the wounds on the right lateral calf. The area right posteriorly has closed. He does not have an open wound on the left under 4 layer compression however on the dorsal left foot just proximal to the toes and the left third toe 2 small open areas were identified 05/11/18; he has not uses compression pumps. The areas on the right lateral calf have coalesced into one large wound necrotic surface. On the left side he has one small wound anteriorly however the edema is now weeping out of a large part of his left leg. He says he wasn't using his pumps because of  the weeping fluid. I explained to him that this is the time he needs to pump more 05/18/18; patient states he is using his compression pumps twice a day. The area on the right lateral large wound albeit superficial. On the left side he has innumerable number of small new wounds on the left calf particularly laterally but several anteriorly and medially. All these appear to have healthy granulated base these look like the remnants of blisters however they occurred under compression. The patient arrives in clinic today with his legs somewhat better. There is certainly less edema, less multiple open areas on the left calf and the right anterior leg looks somewhat better as well superficial and a little smaller. However he relates pain and erythema over the last 3-4 days in the thigh and I looked at this today. He has not been systemically unwell no fever no chills no change in blood sugar values 05/25/18; comes in today in a better state. The severe cellulitis on his left leg seems better with the Keflex. Not as tender. He has not been systemically unwell ooHard to find an open wound on the left lower leg using his compression pumps twice a day   ooThe confluent wounds on his right lateral calf somewhat better looking. These will ultimately need debridement I didn't do this today. 06/01/18; the severe cellulitis on the left anterior thigh has resolved and he is completed his Keflex. ooThere is no open wound on the left leg however there is a superficial excoriation at the base of the third toe dorsally. Skin on the bottom of his left foot is macerated looking. ooThe left the wounds on the lateral right leg actually looks some better although he did require debridement of the top half of this wound area with an open curet 06/09/18 on evaluation today patient appears to be doing poorly in regard to his right lower extremity in particular this appears to likely be infected he has very thick purulent  discharge along with a bright green tent to the discharge. This makes me concerned about the possibility of pseudomonas. He's also having increased discomfort at this point on evaluation. Fortunately there does not appear to be any evidence of infection spreading to the other location at this time. 06/16/18 on evaluation today patient appears to actually be doing fairly well. His ulcer has actually diminished in size quite significantly at this point which is good news. Nonetheless he still does have some evidence of infection he did see infectious disease this morning before coming here for his appointment. I did review the results of their evaluation and their note today. They did actually have him discontinue the Cipro and initiate treatment with linezolid at this time. He is doing this for the next seven days and they recommended a follow-up in four months with them. He is the keep a log of the need for intermittent antibiotic therapy between now and when he falls back up with infectious disease. This will help them gaze what exactly they need to do to try and help them out. 06/23/18; the patient arrives today with no open wounds on the left leg and left third toe healed. He is been using his compression pumps twice a day. On the right lateral leg he still has a sizable wound but this is a lot better than last time I saw this. 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 Has been using silver alginate under 4 layer compression 06/30/18; the only open wound he has is on the right lateral leg and this looks healthy. No debridement is required. We have been using silver alginate. He does not have an open wound on the left leg. There is apparently some drainage from the dorsal proximal third toe on the left although I see no open wound here. 07/03/18 on evaluation today patient was actually here just  for a nurse visit rapid change. However when he was here on Wednesday for his rat change due to having been healed on the left and then developing blisters we initiated the wrap again knowing that he would be back today for [place] to reevaluate and see were at. Unfortunately he has developed some cellulitis into the proximal portion of his right lower extremity even into the region of his thigh. 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 He was placed on one as what at that point. Nonetheless he is done with that and has been tolerating it well otherwise. Doxycycline which in the past really did not seem to be effective for him. Nonetheless I think  the best option may be for [place] to definitely reinitiate the antibiotics for a longer period of time. 07/07/18; since I last saw this patient a week ago he has had a difficult time. At that point he did not have an open wound on his left leg. We transitioned him into juxta light stockings. He was apparently in the clinic the next day with blisters on the left lateral and left medial lower calf. He also had weeping edema fluid. He was put back into a compression wrap. He was also in the clinic on Friday with intense erythema in his right thigh. Per the patient he was started on Bactrim however that didn't work at all in terms of relieving his pain and swelling. He has taken 3 doxycycline that he had left over from last time and that seems to of helped. He has blistering on the right thigh as well. 07/14/18; the erythema on his right thigh has gotten better with doxycycline that he is finishing. 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 Presumably this cellulitis in the thigh was not related to that although I think this is a bit concerning going forward. He still has an area on the right lateral calf the blister on the right medial calf just below  the knee that was discussed above. On the left 2 small open areas left medial and left lateral. Edema control is adequate. He is using his compression pumps twice a day 07/20/18; continued improvement in the condition of both legs especially the edema in his bilateral thighs. He tells me he is been losing weight through a combination of diet and exercise. He is using his compression pumps twice a day. So overall she made to the remaining wounds 07/27/2018; continued improvement in condition of both legs. His edema is well controlled. The area on the right lateral leg is just about closed he had one blisters show up on the medial left upper calf. We have him in 4 layer compression. He is going on a 10-day trip to [place], [place] and [place]. He will be driving. He wants to wear Unna boots because of the lessening amount of constriction. He will not use compression pumps while he is away 08/05/18 on evaluation today patient actually appears to be doing decently well all things considered in regard to his bilateral lower extremities. The worst ulcer is actually only posterior aspect of his left lower extremity with a four layer compression wrap cut into his leg a couple weeks back. He did have a trip and actually had [employer] for the trip that he is worn since he was last here. Nonetheless he feels like the [employer] actually do better for him his swelling is up a little bit but he also with his trip was not taking his Lasix on a regular set schedule like he was supposed to be. He states that obviously the reason being that he cannot drive and keep going without having to urinate too frequently which makes it difficult. He did not have his pumps with him while he was away either which I think also maybe playing a role here too. 08/13/2018; the patient only has a small open wound on the right lateral calf which is a big improvement in the last month or 2. He also has the area  posteriorly just below the posterior fossa on the left which I think was  a wrap injury from several weeks ago. He has no current evidence of cellulitis. He tells me he is back into his compression pumps twice a day. He also tells me that while he was at the laundromat somebody stole a section of his extremitease stockings 08/20/2018; back in the clinic with a much improved state. He only has small areas on the right lateral mid calf which is just about healed. This was is more substantial area for quite a prolonged period of time. He has a small open area on the left anterior tibia. The area on the posterior calf just below the popliteal fossa is closed today. He is using his compression pumps twice a day 08/28/2018; patient has no open wound on the right leg. He has a smattering of open areas on the calf with some weeping lymphedema. More problematically than that it looks as though his wraps of slipped down in his usual he has very angry upper area of edema just below the right medial knee and on the right lateral calf. He has no open area on his feet. The patient is traveling to [facility] next week. I will send him in an antibiotic. We will continue to wrap the right leg. We ordered extremitease stockings for him last week and I plan to transition the right leg to a stocking when he gets home which will be in 10 days time. As usual he is very reluctant to take his pumps with him when he travels 09/07/2018; patient returns from [facility]. He shows me a picture of his left leg in the mid part of his trip last week with intense fire engine erythema. The picture look bad enough I would have considered sending him to the hospital. Instead he went to the wound care center in [facility]. They did not prescribe him antibiotics but he did take some doxycycline he had leftover from a previous visit. I had given him trimethoprim sulfamethoxazole before he left this did not work according  to the patient. This is resulted in some improvement fortunately. He comes back with a large wound on the left posterior calf. Smaller area on the left anterior tibia. Denuded blisters on the dorsal left foot over his toes. Does not have much in the way of wounds on the right leg although he does have a very tender area on the right posterior area just below the popliteal fossa also suggestive of infection. He promises me he is back on his pumps twice a day 09/15/2018; the intense cellulitis in his left lower calf is a lot better. The wound area on the posterior left calf is also so better. However he has reasonably extensive wounds on the dorsal aspect of his second and third toes and the proximal foot just at the base of the toes. There is nothing open on the right leg 09/22/2018; the patient has excellent edema control in his legs bilaterally. He is using his external compression pumps twice a day. He has no open area on the right leg and only the areas in the left foot dorsally second and third toe area on the left side. He does not have any signs of active cellulitis. 10/06/2018; the patient has good edema control bilaterally. He has no open wound on the right leg. There is a blister in the posterior aspect of his left calf that we had to deal with today. He is using his compression pumps twice a day. There is no signs of active cellulitis.  We have been using silver alginate to the wound areas. He still has vulnerable areas on the base of his left first second toes dorsally He has a his extremities stockings and we are going to transition him today into the stocking on the right leg. He is cautioned that he will need to continue to use the compression pumps twice a day. If he notices uncontrolled edema in the right leg he may need to go to 3 times a day. 10/13/2018; the patient came in for a nurse check on Friday he has a large flaccid blister on the right medial calf just below the knee. We  unroofed this. He has this and a new area underneath the posterior mid calf which was undoubtedly a blister as well. He also has several small areas on the right which is the area we put his extremities stocking on. 10/19/2018; the patient went to see infectious disease this morning I am not sure if that was a routine follow-up in any case the doxycycline I had given him was discontinued and started on linezolid. He has not started this. It is easy to look at his left calf and the inflammation and think this is cellulitis however he is very tender in the tissue just below the popliteal fossa and I have no doubt that there is infection going on here. He states the problem he is having is that with the compression pumps the edema goes down and then starts walking the wrap falls down. We will see if we can adhere this. He has 1 or 2 minuscule open areas on the right still areas that are weeping on the posterior left calf, the base of his left second and third toes 10/26/18; back today in clinic with quite of skin breakdown in his left anterior leg. This may have been infection the area below the popliteal fossa seems a lot better however tremendous epithelial loss on the left anterior mid tibia area over quite inexpensive tissue. He has 2 blisters on the right side but no other open wound here. 10/29/2018; came in urgently to see [place] today and we worked him in for review. He states that the 4 layer compression on the right leg caused pain he had to cut it down to roughly his mid calf this caused swelling above the wrap and he has blisters and skin breakdown today. As a result of the pain he has not been using his pumps. Both legs are a lot more edematous and there is a lot of weeping fluid. 11/02/18; arrives in clinic with continued difficulties in the right leg> left. Leg is swollen and painful. multiple skin blisters and new open areas especially laterally. He has not been using his pumps on the  right leg. 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 He is not systemically unwell. 11/09/2018; the patient claims he is being compliant with his pumps. He is finished the doxycycline I gave him last week. Culture I did of the wound on the right lateral leg showed a few very resistant methicillin staph aureus. This was resistant to doxycycline. Nevertheless he states the pain in the leg is a lot better which makes me wonder if the cultured organism was not really what was causing the problem nevertheless this is a very dangerous organism to be culturing out of any wound. His right leg is still a lot larger than the left. He is using an Radio broadcast assistant on this area, he  blames a 4-layer compression for causing the original skin breakdown which I doubt is true however I cannot talk him out of it. We have been using silver alginate to all of these areas which were initially blisters 11/16/2018; patient is being compliant with his external compression pumps at twice a day. Miraculously he arrives in clinic today with absolutely no open wounds. He has better edema control on the left where he has been using 4 layer compression versus wound of wounds on the right and I pointed this out to him. There is no inflammation in the skin in his lower legs which is also somewhat unusual for him. There is no open wounds on the dorsal left foot. He has extremitease stockings at home and I have asked him to bring these in next week. 11/25/18 patient's lower extremity on examination today on the left appears for the most part to be wound free. He does have an open wound on the lateral aspect of the right lower extremity but this is minimal compared to what I've seen in past. He does request that we go ahead and wrap the left leg as well even though there's nothing open just so hopefully it will not reopen in short order. 1/28; patient has superficial open wounds on the right  lateral calf left anterior calf and left posterior calf. His edema control is adequate. 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 We have been using silver alginate as the primary dressing. He claims compliance with his compression pumps 2/4; patient has superficial open wounds on numerous areas of his left calf and again one on the left dorsal foot. The areas on the right lateral calf have healed. The cellulitis that I gave him doxycycline for last week is also resolved this was mostly on the left anterior calf just below the tibial tuberosity. His edema looks fairly well-controlled. He tells me he went to see his primary doctor today and had blood work ordered 2/11; once again he has several open areas on the left calf left tibial area. Most of these are small and appear to have healthy granulation. He does not have anything open on the right. The edema and control in his thighs is pretty good which is usually a good indication he has been using his pumps as requested. 2/18; he continues to have several small areas on the left calf and left tibial area. Most of these are small healthy granulation. We put him in his stocking on the right leg last week and he arrives with a superficial open area over the right upper tibia and a fairly large area on the right lateral tibia in similar condition. His edema control actually does not look too bad, he claims to be using his compression pumps twice a day 2/25. Continued small areas on the left calf and left tibial area. New areas especially on the right are identified just below the tibial tuberosity and on the right upper tibia itself. There are also areas of weeping edema fluid even without an obvious wound. He does not have a considerable degree of lymphedema but clearly there is more edema here than his skin can handle. He states he is using the pumps twice a day.  We have an Unna boot on the right and 4 layer compression on the left. 3/3; he continues to have an area on the right lateral calf and right posterior calf  just below the popliteal fossa. There is a fair amount of tenderness around the wound on the popliteal fossa but I did not see any evidence of cellulitis, could just be that the wrap came down and rubbed in this area. ooHe does not have an open area on the left leg however there is an area on the left dorsal foot at the base of the third toe ooWe have been using silver alginate to all wound areas 3/10; he did not have an open area on his left leg last time he was here a week ago. Today he arrives with a horizontal wound just below the tibial tuberosity and an area on the left lateral calf. He has intense erythema and tenderness in this area. The area is on the right lateral calf and right posterior calf better than last week. We have been using silver alginate as usual 3/18 - Patient returns with 3 small open areas on left calf, and 1 small open area on right calf, the skin looks ok with no significant erythema, he continues the UNA boot on right and 4 layer compression on left. The right lateral calf wound is closed , the right posterior is small area. we will continue silver alginate to the areas. 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 He is been tolerating the dressing changes without complication. Fortunately she has made excellent improvement in regard to the overall status of his wounds. Unfortunately every time we cease wrapping him he ends up reopening in causing more significant issues at that point. Again I'm unsure of the best direction to take although I think the lymphedema clinic may be appropriate for him. 02/03/19 on evaluation  today patient appears to be doing well in regard to the wounds that we saw him for last week unfortunately he has a new area on the proximal portion of his right medial/posterior lower extremity where the wrap somewhat slowed down and caused swelling and a blister to rub and open. Unfortunately this is the only opening that he has on either leg at this point. 02/17/19 on evaluation today patient's bilateral lower extremities appear to be doing well. He still completely healed in regard to the left lower extremity. In regard to the right lower extremity the area where the wrap and slid down and caused the blister still seems to be slightly open although this is dramatically better than during the last evaluation two weeks ago. I'm very pleased with the way this stands overall. 03/03/19 on evaluation today patient appears to be doing well in regard to his right lower extremity in general although he did have a new blister open this does not appear to be showing any evidence of active infection at this time. Fortunately there's No fevers, chills, nausea, or vomiting noted at this time. Overall I feel like he is making good progress it does feel like that the right leg will we perform the [employer] seems to do with a bit better than three layer wrap on the left which slid down on him. We may switch to doing bilateral in the book wraps. 5/4; I have not seen Mr. Libbey in quite some time. According to our case manager he did not have an open wound on his left leg last week. He had 1 remaining wound on the  right posterior medial calf. He arrives today with multiple openings on the left leg probably were blisters and/or wrap injuries from Unna boots. I do not think the Unna boot's will provide adequate compression on the left. I am also not clear about the frequency he is using the compression pumps. 03/17/19 on evaluation today patient appears to be doing excellent in regard to his lower extremities compared  to last week's evaluation apparently. He had gotten significantly worse last week which is unfortunate. The [employer] wrap on the left did not seem to do very well for him at all and in fact it didn't control his swelling significantly enough he had an additional outbreak. Subsequently we go back to the four layer compression wrap on the left. This is good news. At least in that he is doing better and the wound seem to be killing him. He still has not heard anything from the lymphedema clinic. 03/24/19 on evaluation today patient actually appears to be doing much better in regard to his bilateral lower Trinity as compared to last week when I saw him. Fortunately there's no signs of active infection at this time. He has been tolerating the dressing changes without complication. Overall I'm extremely pleased with the progress and appearance in general. 04/07/19 on evaluation today patient appears to be doing well in regard to his bilateral lower extremities. His swelling is significantly down from where it was previous. With that being said he does have a couple blisters still open at this point but fortunately nothing that seems to be too severe and again the majority of the larger openings has healed at this time. 04/14/19 on evaluation today patient actually appears to be doing quite well in regard to his bilateral lower extremities in fact I'm not even sure there's anything significantly open at this time at any site. Nonetheless he did have some trouble with these wraps where they are somewhat irritating him secondary to the fact that he has noted that the graph wasn't too close down to the end of this foot in a little bit short as well up to his knee. Otherwise things seem to be doing quite well. 04/21/19 upon evaluation today patient's wound bed actually showed evidence of being completely healed in regard to both lower extremities which is excellent news. There does not appear to be any signs of  active infection which is also good news. I'm very pleased in this regard. No fevers, chills, nausea, or vomiting noted at this time. 04/28/19 on evaluation today patient appears to be doing a little bit worse in regard to both lower extremities on the left mainly due to the fact that when he went infection disease the wrap was not wrapped quite high enough he developed a blister above this. On the right he is a small open area of nothing too significant but again this is continuing to give him some trouble he has been were in the Velcro compression that he has at home. 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 He's been tolerating the dressing changes without complication. Fortunately there's no signs of active infection at this time. No fevers, chills, nausea, or vomiting noted at this time. We have been trying to get an appointment with her lymphedema clinic in [facility] but unfortunately nobody can get them on phone with not been able to even fax information over the patient likewise is not been able to get in  touch with them. Overall I'm not sure exactly what's going on here with to reach out again today. 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 Still having a lot of drainage unfortunately. He tells me especially in the left but even on the right. There's no signs of active infection which is good news we've been using so ratcheted up to this point. 05/19/19 on evaluation today patient actually appears to be doing quite well with regard to his left lower extremity which is great news. Fortunately in regard to the right lower extremity has an issues with his wrap and he subsequently did remove this from what I'm understanding. Nonetheless long story short is what he had rewrapped once he removed it subsequently had maggots  underneath this wrap whenever he came in for evaluation today. With that being said they were obviously completely cleaned away by the nursing staff. The visit today which is excellent news. However he does appear to potentially have some infection around the right ankle region where the maggots were located as well. He will likely require anabiotic therapy today. 05/26/19 on evaluation today patient actually appears to be doing much better in regard to his bilateral lower extremities. I feel like the infection is under much better control. With that being said there were maggots noted when the wrap was removed yet again today. Again this could have potentially been left over from previous although at this time there does not appear to be any signs of significant drainage there was obviously on the wrap some drainage as well this contracted gnats or otherwise. Either way I do not see anything that appears to be doing worse in my pinion and in fact I think his drainage has slowed down quite significantly likely mainly due to the fact to his infection being under better control. 06/02/2019 on evaluation today patient actually appears to be doing well with regard to his bilateral lower extremities there is no signs of active infection at this time which is great news. With that being said he does have several open areas more so on the right than the left but nonetheless these are all significantly better than previously noted. 06/09/2019 on evaluation today patient actually appears to be doing well. His wrap stayed up and he did not cause any problems he had more drainage on the right compared to the left but overall I do not see any major issues at this time which is great news. 06/16/2019 on evaluation today patient appears to be doing excellent with regard to his lower extremities the only area that is open is a new blister that can have opened as of today on the medial ankle on the left. Other than  this he really seems to be doing great I see no major issues at this point. 06/23/2019 on evaluation today patient appears to be doing quite well with regard to his bilateral lower extremities. In fact he actually appears to be almost completely healed there is a small area of weeping noted of the right lower extremity just above the ankle. Nonetheless fortunately there is no signs of active infection at this time which is good news. No fevers, chills, nausea, vomiting, or diarrhea. 8/24; the patient arrived for a nurse visit today but complained of very significant pain in the left leg and therefore I was asked to look at this. Noted that he did not have an open area on  the left leg last week nevertheless this was wrapped. The patient states that he is not been able to put his compression pumps on the left leg because of the discomfort. He has not been systemically unwell 06/30/2019 on evaluation today patient unfortunately despite being excellent last week is doing much worse with regard to his left lower extremity today. In fact he had to come in for a nurse on Monday where his left leg had to be rewrapped due to excessive weeping Dr. Leanord Hawking placed him on doxycycline at that point. Fortunately there is no signs of active infection Systemically at this time which is good news. 07/07/2019 in regard to the patient's wounds today he actually seems to be doing well with his right lower extremity there really is nothing open or draining at this point this is great news. Unfortunately the left lower extremity is given him additional trouble at this time. There does not appear to be any signs of active infection nonetheless he does have a lot of edema and swelling noted at this point as well as blistering all of which has led to a much more poor appearing leg at this time compared to where it was 2 weeks ago when it was almost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 skin 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

## 2019-10-27 NOTE — Progress Notes (Addendum)
[name], [name] ([id number]) Visit Report for 10/27/2019 Arrival Information Details Patient Name: Date of Service: [name], [name] 10/27/2019 10:15 AM Medical Record [id number] Patient Account Number: [id number] Date of Birth/Sex: Treating RN: 15-Feb-1951 (68 y.o. [name] Primary Care Kazuma Elena: [name] Other Clinician: Referring Myer Bohlman: Treating Chen Saadeh/Extender:[name], Dema Severin, [name] in Treatment: 11 Visit Information History Since Last Visit Walker Added or deleted any medications: No Patient Arrived: Any new allergies or adverse reactions: No Arrival Time: 10:23 Had a fall or experienced change in No Accompanied By: self activities of daily living that may affect Transfer Assistance: None risk of falls: Patient Identification Verified: Yes Signs or symptoms of abuse/neglect since last No Secondary Verification Process Completed: Yes visito Patient Requires Transmission-Based No Hospitalized since last visit: No Precautions: Implantable device outside of the clinic excluding No Patient Has Alerts: Yes cellular tissue based products placed in the center since last visit: Has Dressing in Place as Prescribed: Yes Has Compression in Place as Prescribed: Yes Pain Present Now: No Electronic Signature(s) Signed: 10/27/2019 4:54:21 PM By: Kela Millin Entered By: Kela Millin on 10/27/2019 10:23:51 -------------------------------------------------------------------------------- Compression Therapy Details Patient Name: Date of Service: [name], [name] 10/27/2019 10:15 AM Medical Record [id number] Patient Account Number: [id number] Date of Birth/Sex: Treating RN: 12/05/1950 (68 y.o. [name] Primary Care Ole Lafon: [name] Other Clinician: Referring Felipe Cabell: Treating Zoanne Newill/Extender:[name], Dema Severin, [name] in Treatment: 196 Compression Therapy Performed for Wound Wound #151  Left,Circumferential Lower Leg Assessment: Performed By: Clinician [name], RN Compression Type: Four Layer Post Procedure Diagnosis Same as Pre-procedure Electronic Signature(s) Signed: 10/27/2019 4:50:49 PM By: Baruch Gouty RN, BSN Entered By: Baruch Gouty on 10/27/2019 11:03:00 -------------------------------------------------------------------------------- Compression Therapy Details Patient Name: Date of Service: [name], [name] 10/27/2019 10:15 AM Medical Record [id number] Patient Account Number: [id number] Date of Birth/Sex: Treating RN: 1951-09-08 (68 y.o. [name] Primary Care Shaniqua Guillot: [name] Other Clinician: Referring Alica Shellhammer: Treating Finneus Kaneshiro/Extender:[name], Dema Severin, [name] in Treatment: 196 Compression Therapy Performed for Wound Wound #153 Right,Medial Lower Leg Assessment: Performed By: Jake Church, RN Compression Type: [name] Post Procedure Diagnosis Same as Pre-procedure Electronic Signature(s) Signed: 10/27/2019 4:50:49 PM By: Baruch Gouty RN, BSN Entered By: Baruch Gouty on 10/27/2019 11:03:29 -------------------------------------------------------------------------------- Encounter Discharge Information Details Patient Name: Date of Service: [name], [name] 10/27/2019 10:15 AM Medical Record [id number] Patient Account Number: [id number] Date of Birth/Sex: Treating RN: [birth year] (68 y.o. [name] Primary Care Delsie Amador: [name] Other Clinician: Referring Adaleah Forget: Treating Taima Rada/Extender:[name], Dema Severin, [name] in Treatment: 196 Encounter Discharge Information Items Discharge Condition: Stable Ambulatory Status: Walker Discharge Destination: Home Transportation: Private Auto Accompanied By: self Schedule Follow-up Appointment: Yes Clinical Summary of Care: Electronic Signature(s) Signed: 10/27/2019 4:41:56 PM By: Deon Pilling Entered  By: Deon Pilling on 10/27/2019 12:11:39 -------------------------------------------------------------------------------- Lower Extremity Assessment Details Patient Name: Date of Service: [name], [name] 10/27/2019 10:15 AM Medical Record [id number] Patient Account Number: [id number] Date of Birth/Sex: Treating RN: 01/24/1951 (68 y.o. [name] Primary Care Secilia Apps: [name] Other Clinician: Referring Tayna Smethurst: Treating Lyllie Cobbins/Extender:[name], Dema Severin, [name] in Treatment: 196 Edema Assessment Assessed: [Left: No] [Right: No] Edema: [Left: Yes] [Right: Yes] Calf Left: Right: Point of Measurement: 31 cm From Medial Instep 45 cm 41 cm Ankle Left: Right: Point of Measurement: 12 cm From Medial Instep 27.5 cm 28 cm Vascular Assessment Pulses: Dorsalis Pedis Palpable: [Left:Yes] [Right:Yes] Electronic Signature(s) Signed: 10/27/2019 4:54:21 PM By: Kela Millin Entered By: Verita Schneiders,  Larene Beach on 10/27/2019 10:27:46 -------------------------------------------------------------------------------- Multi-Disciplinary Care Plan Details Patient Name: Date of Service: [name], [name] 10/27/2019 10:15 AM Medical Record [id number] Patient Account Number: [id number] Date of Birth/Sex: Treating RN: 29-Oct-1951 (68 y.o. [name] Primary Care Tynlee Bayle: [name] Other Clinician: Referring Brysan Mcevoy: Treating Carnita Golob/Extender:[name], Dema Severin, [name] in Treatment: 196 Active Inactive Venous Leg Ulcer Nursing Diagnoses: Actual venous Insuffiency (use after diagnosis is confirmed) Goals: Patient will maintain optimal edema control Date Initiated: 09/10/2016 Target Resolution Date: 11/05/2019 Goal Status: Active Verify adequate tissue perfusion prior to therapeutic compression application Date Initiated: 09/10/2016 Date Inactivated: 11/28/2016 Goal Status: Met Interventions: Assess peripheral edema status every  visit. Compression as ordered Provide education on venous insufficiency Notes: edema not contolled above wraps, pt not using lymoh pumps regularly Wound/Skin Impairment Nursing Diagnoses: Impaired tissue integrity Goals: Patient/caregiver will verbalize understanding of skin care regimen Date Initiated: 09/10/2016 Target Resolution Date: 11/05/2019 Goal Status: Active Interventions: Assess patient/caregiver ability to perform ulcer/skin care regimen upon admission and as needed Assess ulceration(s) every visit Provide education on ulcer and skin care Notes: Electronic Signature(s) Signed: 10/27/2019 4:50:49 PM By: Baruch Gouty RN, BSN Entered By: Baruch Gouty on 10/27/2019 11:00:18 -------------------------------------------------------------------------------- Pain Assessment Details Patient Name: Date of Service: [name], [name] 10/27/2019 10:15 AM Medical Record [id number] Patient Account Number: [id number] Date of Birth/Sex: Treating RN: October 30, 1951 (68 y.o. [name] Primary Care Rayyan Burley: [name] Other Clinician: Referring Linley Moskal: Treating Robinette Esters/Extender:[name], Dema Severin, [name] in Treatment: 196 Active Problems Location of Pain Severity and Description of Pain Patient Has Paino No Site Locations Pain Management and Medication Current Pain Management: Electronic Signature(s) Signed: 10/27/2019 4:54:21 PM By: Kela Millin Entered By: Kela Millin on 10/27/2019 10:26:57 -------------------------------------------------------------------------------- Patient/Caregiver Education Details Patient Name: [name] 12/23/2020andnbsp10:15 Date of Service: AM Medical Record [id number] Number: Patient Account Number: [id number] Treating RN: Date of Birth/Gender: 1951/02/16 (68 y.o. [name]) Other Clinician: Primary Care Physician: [name] Treating Worthy Keeler Referring  Physician: Physician/Extender: Deno Etienne in Treatment: 196 Education Assessment Education Provided To: Patient Education Topics Provided Venous: Methods: Explain/Verbal Responses: Reinforcements needed, State content correctly Wound/Skin Impairment: Methods: Explain/Verbal Responses: Reinforcements needed, State content correctly Electronic Signature(s) Signed: 10/27/2019 4:50:49 PM By: Baruch Gouty RN, BSN Entered By: Baruch Gouty on 10/27/2019 11:01:03 -------------------------------------------------------------------------------- Wound Assessment Details Patient Name: Date of Service: [name], [name] 10/27/2019 10:15 AM Medical Record [id number] Patient Account Number: [id number] Date of Birth/Sex: Treating RN: 06-04-1951 (68 y.o. [name] Primary Care Efren Kross: [name] Other Clinician: Referring Daisy Mcneel: Treating Tammy Wickliffe/Extender:[name], Dema Severin, [name] in Treatment: 196 Wound Status Wound Number: [id number] Primary Lymphedema Etiology: Wound Location: Right Upper Leg - Medial Wound Open Wounding Event: Gradually Appeared Status: Date Acquired: 09/12/2019 Comorbid Chronic sinus problems/congestion, Weeks Of Treatment: 6 History: Arrhythmia, Hypertension, Peripheral Arterial Clustered Wound: No Disease, Type II Diabetes, History of Burn, Gout, Confinement Anxiety Photos Wound Measurements Length: (cm) 0 % Reduction Width: (cm) 0 % Reduction Depth: (cm) 0 Epitheliali Area: (cm) 0 Tunneling: Volume: (cm) 0 Underminin Wound Description Classification: Partial Thickness Foul Odor Wound Margin: Flat and Intact Slough/Fib Exudate Amount: None Present Wound Bed Granulation Amount: None Present (0%) Necrotic Amount: None Present (0%) Fascia Exp Fat Layer Tendon Exp Muscle Exp Joint Expo Bone Expos After Cleansing: No rino No Exposed Structure osed: No (Subcutaneous Tissue) Exposed: No osed: No osed:  No sed: No ed: No in Area: 100% in Volume: 100% zation: Large (67-100%) No g: No Treatment  Notes Wound #145 (Right, Medial Upper Leg) 1. Cleanse With Wound Cleanser 2. Periwound Care Barrier cream TCA Cream 4. Secondary Dressing Kerramax/Xtrasorb 5. Secured With Medical laboratory scientific officer) Signed: 11/01/2019 4:06:01 PM By: Mikeal Hawthorne EMT/HBOT Signed: 11/01/2019 5:46:04 PM By: Kela Millin Previous Signature: 10/27/2019 4:54:21 PM Version By: Kela Millin Entered By: Mikeal Hawthorne on 11/01/2019 14:08:36 -------------------------------------------------------------------------------- Wound Assessment Details Patient Name: Date of Service: [name], [name] 10/27/2019 10:15 AM Medical Record [id number] Patient Account Number: [id number] Date of Birth/Sex: Treating RN: 1950/11/11 (68 y.o. [name] Primary Care Litsy Epting: [name] Other Clinician: Referring Holman Bonsignore: Treating Yarielys Beed/Extender:[name], Dema Severin, [name] in Treatment: 196 Wound Status Wound Number: [id number] Primary Lymphedema Etiology: Wound Location: Left Upper Leg - Medial Wound Open Wounding Event: Blister Status: Date Acquired: 10/04/2019 Comorbid Chronic sinus problems/congestion, Weeks Of Treatment: 3 History: Arrhythmia, Hypertension, Peripheral Arterial Clustered Wound: No Disease, Type II Diabetes, History of Burn, Gout, Confinement Anxiety Photos Wound Measurements Length: (cm) 0.4 % Reduct Width: (cm) 0.4 % Reduct Depth: (cm) 0.1 Epitheli Area: (cm) 0.126 Tunneli Volume: (cm) 0.013 Undermi Wound Description Full Thickness Without Exposed Support Foul Odo Classification: Structures Slough/F Wound Flat and Intact Margin: Exudate Medium Amount: Exudate Serous Type: Exudate amber Color: Wound Bed Granulation Amount: Medium (34-66%) Granulation Quality: Pink Fascia E Necrotic Amount: None Present (0%) Fat  Laye Tendon E Muscle E Joint Ex Bone Exp r After Cleansing: No ibrino Yes Exposed Structure xposed: No r (Subcutaneous Tissue) Exposed: Yes xposed: No xposed: No posed: No osed: No ion in Area: 99.6% ion in Volume: 99.6% alization: Small (1-33%) ng: No ning: No Treatment Notes Wound #149 (Left, Medial Upper Leg) 1. Cleanse With Wound Cleanser 2. Periwound Care Barrier cream TCA Cream 4. Secondary Dressing Kerramax/Xtrasorb 5. Secured With Medical laboratory scientific officer) Signed: 11/01/2019 4:06:01 PM By: Mikeal Hawthorne EMT/HBOT Signed: 11/01/2019 5:46:04 PM By: Kela Millin Previous Signature: 10/27/2019 4:54:21 PM Version By: Kela Millin Entered By: Mikeal Hawthorne on 11/01/2019 14:07:45 -------------------------------------------------------------------------------- Wound Assessment Details Patient Name: Date of Service: [name], [name] 10/27/2019 10:15 AM Medical Record [id number] Patient Account Number: [id number] Date of Birth/Sex: Treating RN: 03/19/1951 (68 y.o. [name] Primary Care Severus Brodzinski: [name] Other Clinician: Referring Corderro Koloski: Treating Aeric Burnham/Extender:[name], Dema Severin, [name] in Treatment: 196 Wound Status Wound Number: 151 Primary Lymphedema Etiology: Wound Location: Left Lower Leg - Circumferential Wound Open Wounding Event: Blister Status: Date Acquired: 10/16/2019 Comorbid Chronic sinus problems/congestion, Weeks Of Treatment: 1 History: Arrhythmia, Hypertension, Peripheral Arterial Clustered Wound: Yes Disease, Type II Diabetes, History of Burn, Gout, Confinement Anxiety Photos Wound Measurements Length: (cm) 17 % Reduc Width: (cm) 33 % Reduc Depth: (cm) 0.1 Epithel Clustered Quantity: 10 Tunneli Area: (cm) 440.608 Underm Volume: (cm) 44.061 Wound Description Full Thickness Without Exposed Support Foul O Classification: Structures Slough Wound Distinct,  outline attached Margin: Exudate Large Amount: Exudate Serous Type: Exudate amber Color: Wound Bed Granulation Amount: Large (67-100%) Granulation Quality: Pink, Pale Fascia Necrotic Amount: Small (1-33%) Fat Layer Necrotic Quality: Adherent Slough Tendon Exp Muscle Exp Joint Expo Bone Expos dor After Cleansing: No /Fibrino Yes Exposed Structure Exposed: No (Subcutaneous Tissue) Exposed: Yes osed: No osed: No sed: No ed: No tion in Area: -2982.5% tion in Volume: -2983.3% ialization: None ng: No ining: No Treatment Notes Wound #151 (Left, Circumferential Lower Leg) 1. Cleanse With Wound Cleanser Soap and water 2. Periwound Care Antifungal cream Barrier cream TCA Cream 3. Primary Dressing Applied Calcium Alginate Ag 4. Secondary Dressing  ABD Pad Kerramax/Xtrasorb 6. Support Layer Applied 4 layer compression wrap Notes stockinette. Electronic Signature(s) Signed: 11/01/2019 4:06:01 PM By: Mikeal Hawthorne EMT/HBOT Signed: 11/01/2019 5:46:04 PM By: Kela Millin Previous Signature: 10/27/2019 4:54:21 PM Version By: Kela Millin Entered By: Mikeal Hawthorne on 11/01/2019 14:04:58 -------------------------------------------------------------------------------- Wound Assessment Details Patient Name: Date of Service: [name], [name] 10/27/2019 10:15 AM Medical Record [id number] Patient Account Number: [id number] Date of Birth/Sex: Treating RN: [birth year] (68 y.o. [name] Primary Care Ricard Faulkner: [name] Other Clinician: Referring Monchel Pollitt: Treating Teagan Ozawa/Extender:[name], Dema Severin, [name] in Treatment: 196 Wound Status Wound Number: 152 Primary Lymphedema Etiology: Wound Location: Right Toe Second Wound Open Wounding Event: Gradually Appeared Status: Date Acquired: 10/16/2019 Comorbid Chronic sinus problems/congestion, Weeks Of Treatment: 1 History: Arrhythmia, Hypertension, Peripheral Arterial Clustered  Wound: Yes Disease, Type II Diabetes, History of Burn, Gout, Confinement Anxiety Photos Wound Measurements Length: (cm) 1.5 % Reduct Width: (cm) 1 % Reduct Depth: (cm) 0.1 Epitheli Clustered Quantity: 3 Tunnelin Area: (cm) 1.178 Undermi Volume: (cm) 0.118 Wound Description Classification: Full Thickness Without Exposed Support Foul Odo Structures Slough/F Wound Distinct, outline attached Margin: Exudate Medium Amount: Exudate Serous Type: Exudate amber Color: Wound Bed Granulation Amount: Large (67-100%) Granulation Quality: Pink Fascia E Necrotic Amount: None Present (0%) Fat Laye Tendon E Muscle E Joint Ex Bone Exp r After Cleansing: No ibrino No Exposed Structure xposed: No r (Subcutaneous Tissue) Exposed: Yes xposed: No xposed: No posed: No osed: No ion in Area: 66.7% ion in Volume: 66.6% alization: None g: No ning: No Treatment Notes Wound #152 (Right Toe Second) 1. Cleanse With Wound Cleanser Soap and water 2. Periwound Care Barrier cream 3. Primary Dressing Applied Calcium Alginate Ag 4. Secondary Dressing Dry Gauze Roll Gauze 5. Secured With Recruitment consultant) Signed: 11/01/2019 4:06:01 PM By: Mikeal Hawthorne EMT/HBOT Signed: 11/01/2019 5:46:04 PM By: Kela Millin Previous Signature: 10/27/2019 4:54:21 PM Version By: Kela Millin Entered By: Mikeal Hawthorne on 11/01/2019 14:06:14 -------------------------------------------------------------------------------- Wound Assessment Details Patient Name: Date of Service: [name], [place] 10/27/2019 10:15 AM Medical Record [id number] Patient Account Number: [id number] Date of Birth/Sex: Treating RN: [birth year] (68 y.o. [name] Primary Care Hassie Mandt: [name] Other Clinician: Referring Keera Altidor: Treating Jaliah Foody/Extender:[name], Dema Severin, [name] in Treatment: 196 Wound Status Wound Number: [id number] Primary Lymphedema Etiology: Wound  Location: Right Lower Leg - Medial Wound Open Wounding Event: Blister Status: Date Acquired: 10/27/2019 Comorbid Chronic sinus problems/congestion, Weeks Of Treatment: 0 History: Arrhythmia, Hypertension, Peripheral Arterial Clustered Wound: No Disease, Type II Diabetes, History of Burn, Gout, Confinement Anxiety Photos Wound Measurements Length: (cm) 5 % Reduc Width: (cm) 5.5 % Reduc Depth: (cm) 0.1 Epithel Area: (cm) 21.598 Tunnel Volume: (cm) 2.16 Underm Wound Description Classification: Full Thickness Without Exposed Support Foul O Structures Slough Wound Distinct, outline attached Margin: Exudate Medium Amount: Exudate Serous Type: Exudate amber Color: Wound Bed Granulation Amount: Large (67-100%) Granulation Quality: Pink Fascia Necrotic Amount: None Present (0%) Fat La Tendon Ex Muscle Ex Joint Exp Bone Expo dor After Cleansing: No /Fibrino No Exposed Structure Exposed: No yer (Subcutaneous Tissue) Exposed: Yes posed: No posed: No osed: No sed: No tion in Area: 0% tion in Volume: 0% ialization: None ing: No ining: No Treatment Notes Wound #153 (Right, Medial Lower Leg) 1. Cleanse With Wound Cleanser Soap and water 2. Periwound Care Barrier cream TCA Cream 3. Primary Dressing Applied Calcium Alginate Ag 4. Secondary Dressing ABD Pad 6. Support Layer Forensic psychologist. Electronic Signature(s) Signed: 11/01/2019 4:06:01  PM By: Mikeal Hawthorne EMT/HBOT Signed: 11/01/2019 5:46:04 PM By: Kela Millin Previous Signature: 10/27/2019 4:54:21 PM Version By: Kela Millin Entered By: Mikeal Hawthorne on 11/01/2019 14:07:07 -------------------------------------------------------------------------------- Vitals Details Patient Name: Date of Service: LAJARVIS, ITALIANO 10/27/2019 10:15 AM Medical Record [id number] Patient Account Number: [id number] Date of Birth/Sex: Treating RN: 1950-11-17 (68 y.o. [name] Primary Care Nazaiah Navarrete: [name] Other Clinician: Referring Jenel Gierke: Treating Sinai Illingworth/Extender:[name], Dema Severin, [name] in Treatment: 196 Vital Signs Time Taken: 10:20 Temperature (F): 97.6 Height (in): 70 Pulse (bpm): 65 Weight (lbs): 380.2 Respiratory Rate (breaths/min): 18 Body Mass Index (BMI): 54.5 Blood Pressure (mmHg): 168/79 Reference Range: 80 - 120 mg / dl Electronic Signature(s) Signed: 10/27/2019 4:54:21 PM By: Kela Millin Entered By: Kela Millin on 10/27/2019 10:25:16

## 2019-11-03 ENCOUNTER — Encounter (HOSPITAL_BASED_OUTPATIENT_CLINIC_OR_DEPARTMENT_OTHER): Payer: Medicare Other | Admitting: Internal Medicine

## 2019-11-03 ENCOUNTER — Other Ambulatory Visit: Payer: Self-pay

## 2019-11-03 DIAGNOSIS — L97119 Non-pressure chronic ulcer of right thigh with unspecified severity: Secondary | ICD-10-CM | POA: Diagnosis not present

## 2019-11-03 DIAGNOSIS — I890 Lymphedema, not elsewhere classified: Secondary | ICD-10-CM | POA: Diagnosis not present

## 2019-11-03 DIAGNOSIS — L97822 Non-pressure chronic ulcer of other part of left lower leg with fat layer exposed: Secondary | ICD-10-CM | POA: Diagnosis not present

## 2019-11-03 DIAGNOSIS — L97129 Non-pressure chronic ulcer of left thigh with unspecified severity: Secondary | ICD-10-CM | POA: Diagnosis not present

## 2019-11-03 NOTE — Progress Notes (Signed)
Alphonse GuildCOWPER, Waylyn J. ([id number]) Visit Report for 11/03/2019 HPI Details Patient Name: Date of Service: Alphonse GuildCOWPER, Williams J. 11/03/2019 10:45 AM Medical Record [id number] Patient Account Number: [id number] Date of Birth/Sex: Treating RN: 04/29/1951 (68 y.o. M) Primary Care Provider: Nicoletta BaMCGOWEN, [name] Other Clinician: Referring Provider: Treating Provider/Extender:Zynia Wojtowicz, Francis DowseMurthy MCGOWEN, [name] in Treatment: 197 History of Present Illness HPI Description: Referred by PCP for consultation. Patient has long standing history of BLE venous stasis, no prior ulcerations. At beginning of month, developed cellulitis and weeping. Received IM Rocephin followed by Keflex and resolved. Wears compression stocking, appr 6 months old. Not sure strength. No present drainage. 01/22/16 this is a patient who is a type II diabetic on insulin. 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 He tells me he religiously wears pressure stockings of uncertain strength. He was here with weeping edema about 8 months ago but did not have an open wound. Roughly a month ago he had a reopening on his bilateral legs. He is been using bandages and Neosporin. He does not complain of pain. 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 Last BUN/creatinine I have is from 11/20/15 at 13 and 1.0 respectively 01/29/16; patient arrives today having tolerated the Profore wrap. He brought in his stockings and these are 18 mmHg stockings he bought from [employer]. The compression here is likely inadequate. He does not complain of pain or excessive drainage she has no systemic symptoms. The wound on the right looks improved as does the one on the left although one on the left is more substantial with still tissue at risk below the actual wound area on the bilateral posterior calf 02/05/16; patient arrives  with poor edema control. He states that we did put a 4 layer compression on it last week. No weight appear 5 this. 02/12/16; the area on the posterior right Has healed. The left Has a substantial wound that has necrotic surface eschar that requires a debridement with a curette. 02/16/16;the patient called or a Nurse visit secondary to increased swelling. He had been in earlier in the week with his right leg healed. He was transitioned to is on pressure stocking on the right leg with the only open wound on the left, a substantial area on the left posterior calf. Note he has a history of severe lower extremity edema, he has a history of chronic atrial fibrillation but not heart failure per my notes but I'll need to research this. He is not complaining of chest pain shortness of breath or orthopnea. The intake nurse noted blisters on the previously closed right leg 02/19/16; this is the patient's regular visit day. I see him on Friday with escalating edema new wounds on the right leg and clear signs of at least right ventricular heart failure. I increased his Lasix to 40 twice a day. He is returning currently in follow-up. States he is noticed a decrease in that the edema 02/26/16 patient's legs have much less edema. There is nothing really open on the right leg. The left leg has improved condition of the large superficial wound on the posterior left leg 03/04/16; edema control is very much better. The patient's right leg wounds have healed. On the left leg he continues to have severe venous inflammation on the posterior aspect of the left leg. There is no tenderness and I don't think any of this is cellulitis. 03/11/16; patient's  right leg is married healed and he is in his own stocking. The patient's left leg has deteriorated somewhat. There is a lot of erythema around the wound on the posterior left leg. There is also a significant rim of erythema posteriorly just above where the wrap would've ended  there is a new wound in this location and a lot of tenderness. Can't rule out cellulitis in this area. 03/15/16; patient's right leg remains healed and he is in his own stocking. The patient's left leg is much better than last review. His major wound on the posterior aspect of his left Is almost fully epithelialized. He has 3 small injuries from the wraps. Really. Erythema seems a lot better on antibiotics 03/18/16; right leg remains healed and he is in his own stocking. The patient's left leg is much better. The area on the posterior aspect of the left calf is fully epithelialized. His 3 small injuries which were wrap injuries on the left are improved only one seems still open his erythema has resolved 03/25/16; patient's right leg remains healed and he is in his own stocking. There is no open area today on the left leg posterior leg is completely closed up. His wrap injuries at the superior aspect of his leg are also resolved. He looks as though he has some irritation on the dorsal ankle but this is fully epithelialized without evidence of infection. 03/28/16; we discharged this patient on Monday. Transitioned him into his own stocking. There were problems almost immediately with uncontrolled swelling weeping edema multiple some of which have opened. He does not feel systemically unwell in particular no chest pain no shortness of breath and he does not feel 04/08/16; the edema is under better control with the Profore light wrap but he still has pitting edema. There is one large wound anteriorly 2 on the medial aspect of his left leg and 3 small areas on the superior posterior calf. Drainage is not excessive he is tolerating a Profore light well 04/15/16; put a Profore wrap on him last week. This is controlled is edema however he had a lot of pain on his left anterior foot most of his wounds are healed 04/22/16 once again the patient has denuded areas on the left anterior foot which he states are because  his wrap slips up word. He saw his primary physician today is on Lasix 40 twice a day and states that he his weight is down 20 pounds over the last 3 months. 04/29/16: Much improved. left anterior foot much improved. He is now on Lasix 80 mg per day. Much improved edema control 05/06/16; I was hoping to be able to discharge him today however once again he has blisters at a low level of where the compression was placed last week mostly on his left lateral but also his left medial leg and a small area on the anterior part of the left foot. 05/09/16; apparently the patient went home after his appointment on 7/4 later in the evening developing pain in his upper medial thigh together with subjective fever and chills although his temperature was not taken. The pain was so intense he felt he would probably have to call 911. However he then remembered that he had leftover doxycycline from a previous round of antibiotics and took these. By the next morning he felt a lot better. He called and spoke to one of our nurses and I approved doxycycline over the phone thinking that this was in relation to the wounds we  had previously seen although they were definitely were not. The patient feels a lot better old fever no chills he is still working. Blood sugars are reasonably controlled 05/13/16; patient is back in for review of his cellulitis on his anterior medial upper thigh. He is taking doxycycline this is a lot better. Culture I did of the nodular area on the dorsal aspect of his foot grew MRSA this also looks a lot better. 05/20/16; the patient is cellulitis on the medial upper thigh has resolved. All of his wound areas including the left anterior foot, areas on the medial aspect of the left calf and the lateral aspect of the calf at all resolved. He has a new blister on the left dorsal foot at the level of the fourth toe this was excised. No evidence of infection 05/27/16; patient continues to complain weeping  edema. He has new blisterlike wounds on the left anterior lateral and posterior lateral calf at the top of his wrap levels. The area on his left anterior foot appears better. He is not complaining of fever, pain or pruritus in his feet. 05/30/16; the patient's blisters on his left anterior leg posterior calf all look improved. He did not increase the Lasix 100 mg as I suggested because he was going to run out of his 40 mg tablets. He is still having weeping edema of his toes 06/03/16; I renewed his Lasix at 80 mg once a day as he was about to run out when I last saw him. He is on 80 mg of Lasix now. I have asked him to cut down on the excessive amount of water he was drinking and asked him to drink according to his thirst mechanisms 06/12/2016 -- was seen 2 days ago and was supposed to wear his compression stockings at home but he is developed lymphedema and superficial blisters on the left lower extremity and hence came in for a review 06/24/16; the remaining wound is on his left anterior leg. He still has edema coming from between his toes. There is lymphedema here however his edema is generally better than when I last saw this. He has a history of atrial fibrillation but does not have a known history of congestive heart failure nevertheless I think he probably has this at least on a diastolic basis. 07/01/16 I reviewed his echocardiogram from January 2017. This was essentially normal. He did not have LVH, EF of 55-60%. His right ventricular function was normal although he did have trivial tricuspid and pulmonic regurgitation. This is not audible on exam however. I increased his Lasix to do massive edema in his legs well above his knees I think in early July. He was also drinking an excessive amount of water at the time. 07/15/16; missed his appointment last week because of the Labor Day holiday on Monday. He could not get another appointment later in the week. Started to feel the wrap digging in  superiorly so we remove the top half and the bottom half of his wrap. He has extensive erythema and blistering superiorly in the left leg. Very tender. Very swollen. Edema in his foot with leaking edema fluid. He has not been systemically unwell 07/22/16; the area on the left leg laterally required some debridement. The medial wounds look more stable. His wrap injury wounds appear to have healed. Edema and his foot is better, weeping edema is also better. He tells me he is meeting with the supplier of the external compression pumps at work 08/05/16; the patient  was on vacation last week in [place]. His wrap is been on for an extended period of time. Also over the weekend he developed an extensive area of tender erythema across his anterior medial thigh. He took to doxycycline yesterday that he had leftover from a previous prescription. The patient complains of weeping edema coming out of his toes 08/08/16; I saw this patient on 10/2. He was tender across his anterior thigh. I put him on doxycycline. He returns today in follow-up. He does not have any open wounds on his lower leg, he still has edema weeping into his toes. 08/12/16; patient was seen back urgently today to follow-up for his extensive left thigh cellulitis/erysipelas. He comes back with a lot less swelling and erythema pain is much better. I believe I gave him Augmentin and Cipro. His wrap was cut down as he stated a roll down his legs. He developed blistering above the level of the wrap that remained. He has 2 open blisters and 1 intact. 08/19/16; patient is been doing his primary doctor who is increased his Lasix from 40-80 once a day or 80 already has less edema. Cellulitis has remained improved in the left thigh. 2 open areas on the posterior left calf 08/26/16; he returns today having new open blisters on the anterior part of his left leg. He has his compression pumps but is not yet been shown how to use some vital  representative from the supplier. 09/02/16 patient returns today with no open wounds on the left leg. Some maceration in his plantar toes 09/10/2016 -- Dr. Leanord Hawking had recently discharged him on 09/02/2016 and he has come right back with redness swelling and some open ulcers on his left lower extremity. He says this was caused by trying to apply his compression stockings and he's been unable to use this and has not been able to use his lymphedema pumps. He had some doxycycline leftover and he has started on this a few days ago. 09/16/16; there are no open wounds on his leg on the left and no evidence of cellulitis. 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 He does not complain of symptoms here. I am not clear use using his external compression pumps. 09/23/16; I have not seen this patient in 2 weeks. He canceled his appointment 10 days ago as he was going on vacation. He tells me that on Monday he noticed a large area on his posterior left leg which is been draining copiously and is reopened into a large wound. He is been using ABDs and the external part of his juxtalite, according to our nurse this was not on properly. 10/07/16; Still a substantial area on the posterior left leg. Using silver alginate 10/14/16; in general better although there is still open area which looks healthy. Still using silver alginate. He reminds me that this happen before he left for [facility]. Today while he was showering in the morning. He had been using his juxtalite's 10/21/16; the area on his posterior left leg is fully epithelialized. However he arrives today with a large area of tender erythema in his medial and posterior left thigh just above the knee. I have marked the area. Once again he is reluctant to consider hospitalization. I treated him with oral antibiotics in the past for a similar situation with resolution I think with doxycycline however  this area it seems more extensive to me. He is not complaining of fever but does  have chills and says states he is thirsty. His blood sugar today was in the 140s at home 10/25/16 the area on his posterior left leg is fully epithelialized although there is still some weeping edema. The large area of tenderness and erythema in his medial and posterior left thigh is a lot less tender although there is still a lot of swelling in this thigh. He states he feels a lot better. He is on doxycycline and Augmentin that I started last week. This will continued until Tuesday, December 26. I have ordered a duplex ultrasound of the left thigh rule out DVT whether there is an abscess something that would need to be drained I would also like to know. 11/01/16; he still has weeping edema from a not fully epithelialized area on his left posterior calf. Most of the rest of this looks a lot better. He has completed his antibiotics. His thigh is a lot better. Duplex ultrasound did not show a DVT in the thigh 11/08/16; he comes in today with more Denuded surface epithelium from the posterior aspect of his calf. There is no real evidence of cellulitis. The superior aspect of his wrap appears to have put quite an indentation in his leg just below the knee and this may have contributed. He does not complain of pain or fever. We have been using silver alginate as the primary dressing. The area of cellulitis in the right thigh has totally resolved. He has been using his compression stockings once a week 11/15/16; the patient arrives today with more loss of epithelium from the posterior aspect of his left calf. He now has a fairly substantial wound in this area. The reason behind this deterioration isn't exactly clear although his edema is not well controlled. He states he feels he is generally more swollen systemically. He is not complaining of chest pain shortness of breath fever. Tells me he has an appointment with his primary  physician in early February. He is on 80 mg of oral Lasix a day. He claims compliance with the external compression pumps. He is not having any pain in his legs similar to what he has with his recurrent cellulitis 11/22/16; the patient arrives a follow-up of his large area on his left lateral calf. This looks somewhat better today. He came in earlier in the week for a dressing change since I saw him a week ago. He is not complaining of any pain no shortness of breath no chest pain 11/28/16; the patient arrives for follow-up of his large area on the left lateral calf this does not look better. In fact it is larger weeping edema. The surface of the wound does not look too bad. We have been using silver alginate although I'm not certain that this is a dressing issue. 12/05/16; again the patient follows up for a large wound on the left lateral and left posterior calf this does not look better. There continues to be weeping edema necrotic surface tissue. More worrisome than this once again there is erythema below the wound involving the distal Achilles and heel suggestive of cellulitis. He is on his feet working most of the day of this is not going well. We are changing his dressing twice a week to facilitate the drainage. 12/12/16; not much change in the overall dimensions of the large area on the left posterior calf. This is very inflamed looking. I gave him an. Doxycycline last week does not really seem to have helped. He found the wrap very painful  indeed it seems to of dog into his legs superiorly and perhaps around the heel. He came in early today because the drainage had soaked through his dressings. 12/19/16- patient arrives for follow-up evaluation of his left lower extremity ulcers. He states that he is using his lymphedema pumps once daily when there is "no drainage". He admits to not using his lipedema pumps while under current treatment. His blood sugars have been consistently between  150-200. 12/26/16; the patient is not using his compression pumps at home because of the wetness on his feet. I've advised him that I think it's important for him to use this daily. He finds his feet too wet, he can put a plastic bag over his legs while he is in the pumps. Otherwise I think will be in a vicious circle. We are using silver alginate to the major area on his left posterior calf 01/02/17; the patient's posterior left leg has further of all into 3 open wounds. All of them covered with a necrotic surface. He claims to be using his compression pumps once a day. His edema control is marginal. Continue with silver alginate 01/10/17; the patient's left posterior leg actually looks somewhat better. There is less edema, less erythema. Still has 3 open areas covered with a necrotic surface requiring debridement. He claims to be using his compression pumps once a day his edema control is better 01/17/17; the patient's left posterior calf look better last week when I saw him and his wrap was changed 2 days ago. He has noted increasing pain in the left heel and arrives today with much larger wounds extensive erythema extending down into the entire heel area especially tender medially. He is not systemically unwell CBGs have been controlled no fever. Our intake nurse showed me limegreen drainage on his AVD pads. 01/24/17; his usual this patient responds nicely to antibiotics last week giving him Levaquin for presumed Pseudomonas. The whole entire posterior part of his leg is much better much less inflamed and in the case of his Achilles heel area much less tender. He has also had some epithelialization posteriorly there are still open areas here and still draining but overall considerably better 01/31/17- He has continue to tolerate the compression wraps. he states that he continues to use the lymphedema pumps daily, and can increase to twice daily on the weekends. He is voicing no complaints or concerns  regarding his LLE ulcers 02/07/17-he is here for follow-up evaluation. He states that he noted some erythema to the left medial and anterior thigh, which he states is new as of yesterday. He is concerned about recurrent cellulitis. He states his blood sugars have been slightly elevated, this morning in the 180s 02/14/17; he is here for follow-up evaluation. When he was last here there was erythema superiorly from his posterior wound in his anterior thigh. He was prescribed Levaquin however a culture of the wound surface grew MRSA over the phone I changed him to doxycycline on Monday and things seem to be a lot better. 02/24/17; patient missed his appointment on Friday therefore we changed his nurse visit into a physician visit today. Still using silver alginate on the large area of the posterior left thigh. He isn't new area on the dorsal left second toe 03/03/17; actually better today although he admits he has not used his external compression pumps in the last 2 days or so because of work responsibilities over the weekend. 03/10/17; continued improvement. External compression pumps once a day almost all of  his wounds have closed on the posterior left calf. Better edema control 03/17/17; in general improved. He still has 3 small open areas on the lateral aspect of his left leg however most of the area on the posterior part of his leg is epithelialized. He has better edema control. He has an ABD pad under his stocking on the right anterior lower leg although he did not let us look at that today. 03/24/17; patient arrives back in clinic today with no open areas however there are areas on the posterior left calf and anterior left calf that are less than 100% epithelialized. His edema is well controlled in the left lower leg. There is some pitting edema probably lymphedema in the left upper thigh. He uses compression pumps at home once per day. I tried to get him to do this twice a day although he is very  reticent. 04/01/2017 -- for the last 2 days he's had significant redness, tenderness and weeping and came in for an urgent visit today. 04/07/17; patient still has 6 more days of doxycycline. He was seen by Dr. Meyer Russel last Wednesday for cellulitis involving the posterior aspect, lateral aspect of his Involving his heel. For the most part he is better there is less erythema and less weeping. He has been on his feet for 12 hours 2 over the weekend. Using his compression pumps once a day 04/14/17 arrives today with continued improvement. Only one area on the posterior left calf that is not fully epithelialized. He has intense bilateral venous inflammation associated with his chronic venous insufficiency disease and secondary lymphedema. We have been using silver alginate to the left posterior calf wound In passing he tells [place] today that the right leg but we have not seen in quite some time has an open area on it but he doesn't want [place] to look at this today states he will show this to [place] next week. 04/21/17; there is no open area on his left leg although he still reports some weeping edema. He showed [place] his right leg today which is the first time we've seen this leg in a long time. He has a large area of open wound on the right leg anteriorly healthy granulation. Quite a bit of swelling in the right leg and some degree of venous inflammation. He told us about the right leg in passing last week but states that deterioration in the right leg really only happened over the weekend 04/28/17; there is no open area on the left leg although there is an irritated part on the posterior which is like a wrap injury. The wound on the right leg which was new from last week at least to [place] is a lot better. 05/05/17; still no open area on the left leg. Patient is using his new compression stocking which seems to be doing a good job of controlling the edema. He states he is using his compression pumps once per day. The  right leg still has an open wound although it is better in terms of surface area. Required debridement. A lot of pain in the posterior right Achilles marked tenderness. Usually this type of presentation this patient gives concern for an active cellulitis 05/12/17; patient arrives today with his major wound from last week on the right lateral leg somewhat better. Still requiring debridement. He was using his compression stocking on the left leg however that is reopened with superficial wounds anteriorly he did not have an open wound on this leg previously. He  is still using his juxta light's once daily at night. He cannot find the time to do this in the morning as he has to be at work by 7 AM 05/19/17; right lateral leg wound looks improved. No debridement required. The concerning area is on the left posterior leg which appears to almost have a subcutaneous hemorrhagic component to it. We've been using silver alginate to all the wounds 05/26/17; the right lateral leg wound continues to look improved. However the area on the left posterior calf is a tightly adherent surface. [place] using silver alginate. Because of the weeping edema in his legs there is very little good alternatives. 06/02/17; the patient left here last week looking quite good. Major wound on the left posterior calf and a small one on the right lateral calf. Both of these look satisfactory. He tells me that by Wednesday he had noted increased pain in the left leg and drainage. He called on Thursday and Friday to get an appointment here but we were blocked. He did not go to urgent care or his primary physician. He thinks he had a fever on Thursday but did not actually take his temperature. He has not been using his compression pumps on the left leg because of pain. I advised him to go to the emergency room today for IV antibiotics for stents of left leg cellulitis but he has refused I have asked him to take 2 days off work to keep his  leg elevated and he has refused this as well. In view of this I'm going to call him and Augmentin and doxycycline. 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 However after a detailed discussion regarding the risk benefits and alternatives he refuses to get admitted to the hospital. With no other recourse I will continue him on oral antibiotics as before and hopefully he'll have his infectious disease consultation this week. 06/16/2017 -- the patient was seen today by the nurse practitioner at infectious disease Ms. Dixon. 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 He has also been advise Lotrimin cream locally. she also had wise IV antibiotics for his condition if it flares up 06/23/17; patient arrives today with drainage bilaterally although the remaining wound on the left posterior calf after cleaning up today "highlighter yellow drainage" did not look too bad. Unfortunately he has had breakdown on the right anterior leg [previously this leg had not been open and he is using a black stocking] he went to see infectious disease and is been put on clindamycin 150 mg daily, I did not verify the dose although I'm not familiar with using clindamycin in this dosing range, perhaps for prophylaxisoo 06/27/17; I brought this patient back today to follow-up on the wound deterioration on the right lower leg together with surrounding cellulitis. I started him on doxycycline 4 days ago. This area looks better however he comes in today with intense cellulitis on the medial part of his left  thigh. This is not have a wound in this area. Extremely tender. We've been using silver alginate to the wounds  on the right lower leg left lower leg with bilateral 4 layer compression he is using his external compression pumps once a day 07/04/17; patient's left medial thigh cellulitis looks better. He has not been using his compression pumps as his insert said it was contraindicated with cellulitis. His right leg continues to make improvements all the wounds are still open. We only have one remaining wound on the left posterior calf. Using silver alginate to all open areas. He is on doxycycline which I started a week ago and should be finishing I gave him Augmentin after Thursday's visit for the severe cellulitis on the left medial thigh which fortunately looks better 07/14/17; the patient's left medial thigh cellulitis has resolved. The cellulitis in his right lower calf on the right also looks better. All of his wounds are stable to improved we've been using silver alginate he has completed the antibiotics I have given him. He has clindamycin 150 mg once a day prescribed by infectious disease for prophylaxis, I've advised him to start this now. We have been using bilateral Unna boots over silver alginate to the wound areas 07/21/17; the patient is been to see infectious disease who noted his recurrent problems with cellulitis. He was not able to tolerate prophylactic clindamycin therefore he is on amoxicillin 500 twice a day. He also had a second daily dose of Lasix added By Dr. Oneta Rack but he is not taking this. Nor is he being completely compliant with his compression pumps a especially not this week. He has 2 remaining wounds one on the right posterior lateral lower leg and one on the left posterior medial lower leg. 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 The patient has Unna boots bilaterally. Still wounds on his  right lateral, left medial, and a new open area on the left anterior lateral lower leg 08/04/17; he remains on amoxicillin twice a day for prophylaxis of recurrent cellulitis. He has bilateral Unna boots for compression and silver alginate to his wounds. Arrives today with his legs looking as good as I have seen him in quite some time. Not surprisingly his wounds look better as well with improvement on the right lateral leg venous insufficiency wound and also the left medial leg. He is still using the compression pumps once a day 08/11/17; both legs appear to be doing better wounds on the right lateral and left medial legs look better. Skin on the right leg quite good. He is been using silver alginate as the primary dressing. I'm going to use Anasept gel calcium alginate and maintain all the secondary dressings 08/18/17; the patient continues to actually do quite well. The area on his right lateral leg is just about closed the left medial also looks better although it is still moist in this area. His edema is well controlled we have been using Anasept gel with calcium alginate and the usual secondary dressings, 4 layer compression and once daily use of his compression pumps "always been able to manage 09/01/17; the patient continues to do reasonably well in spite of his trip to [place]. The area on the right lateral leg is epithelialized. Left is much better but still open. He has more edema and more chronic erythema on the left leg [venous inflammation] 09/08/17; he arrives today with no open wound on the right lateral leg and decently controlled edema. Unfortunately his left leg is not nearly as in his good situation as last week.he apparently  had increasing edema starting on Saturday. He edema soaked through into his foot so used a plastic bag to walk around his home. The area on the medial right leg which was his open area is about the same however he has lost surface epithelium on the  left lateral which is new and he has significant pain in the Achilles area of the left foot. He is already on amoxicillin chronically for prophylaxis of cellulitis in the left leg 09/15/17; he is completed a week of doxycycline and the cellulitis in the left posterior leg and Achilles area is as usual improved. He still has a lot of edema and fluid soaking through his dressings. There is no open wound on the right leg. He saw infectious disease NP today 09/22/17;As usual 1 we transition him from our compression wraps to his stockings things did not go well. He has several small open areas on the right leg. He states this was caused by the compression wrap on his skin although he did not wear this with the stockings over them. He has several superficial areas on the left leg medially laterally posteriorly. He does not have any evidence of active cellulitis especially involving the left Achilles The patient is traveling from [facility] Saturday going to [facility]. He states he isn't attempting to get an appointment with a heel objects wound center there to change his dressings. I am not completely certain whether this will work 10/06/17; the patient came in on Friday for a nurse visit and the nurse reported that his legs actually look quite good. He arrives in clinic today for his regular follow-up visit. He has a new wound on his left third toe over the PIP probably caused by friction with his footwear. He has small areas on the left leg and a very superficial but epithelialized area on the right anterior lateral lower leg. Other than that his legs look as good as I've seen him in quite some time. We have been using silver alginate Review of systems; no chest pain no shortness of breath other than this a 10 point review of systems negative 10/20/17; seen by Dr. Meyer Russel last week. He had taken some antibiotics [doxycycline] that he had left over. Dr. Meyer Russel thought he had candida infection and  declined to give him further antibiotics. He has a small wound remaining on the right lateral leg several areas on the left leg including a larger area on the left posterior several left medial and anterior and a small wound on the left lateral. The area on the left dorsal third toe looks a lot better. ROS; Gen.; no fever, respiratory no cough no sputum Cardiac no chest pain other than this 10 point review of system is negative 10/30/17; patient arrives today having fallen in the bathtub 3 days ago. It took him a while to get up. He has pain and maceration in the wounds on his left leg which have deteriorated. He has not been using his pumps he also has some maceration on the right lateral leg. 11/03/17; patient continues to have weeping edema especially in the left leg. This saturates his dressings which were just put on on 12/27. As usual the doxycycline seems to take care of the cellulitis on his lower leg. He is not complaining of fever, chills, or other systemic symptoms. He states his leg feels a lot better on the doxycycline I gave him empirically. He also apparently gets injections at his primary doctor's officeo Rocephin for cellulitis prophylaxis.  I didn't ask him about his compression pump compliance today I think that's probably marginal. Arrives in the clinic with all of his dressings primary and secondary macerated full of fluid and he has bilateral edema 11/10/17; the patient's right leg looks some better although there is still a cluster of wounds on the right lateral. The left leg is inflamed with almost circumferential skin loss medially to laterally although we are still maintaining anteriorly. He does not have overt cellulitis there is a lot of drainage. He is not using compression pumps. We have been using silver alginate to the wound areas, there are not a lot of options here 11/17/17; the patient's right leg continues to be stable although there is still open wounds, better than  last week. The inflammation in the left leg is better. Still loss of surface layer epithelium especially posteriorly. There is no overt cellulitis in the amount of edema and his left leg is really quite good, tells me he is using his compression pumps once a day. 11/24/17; patient's right leg has a small superficial wound laterally this continues to improve. The inflammation in the left leg is still improving however we have continuous surface layer epithelial loss posteriorly. There is no overt cellulitis in the amount of edema in both legs is really quite good. He states he is using his compression pumps on the left leg once a day for 5 out of 7 days 12/01/17; very small superficial areas on the right lateral leg continue to improve. Edema control in both legs is better today. He has continued loss of surface epithelialization and left posterior calf although I think this is better. We have been using silver alginate with large number of absorptive secondary dressings 4 layer on the left Unna boot on the right at his request. He tells me he is using his compression pumps once a day 12/08/17; he has no open area on the right leg is edema control is good here. On the left leg however he has marked erythema and tenderness breakdown of skin. He has what appears to be a wrap injury just distal to the popliteal fossa. 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 There was swelling erythema and breakdown in the right leg. He has much worse skin breakdown in the left leg as well multiple open areas medially and posteriorly as well as laterally. He tells me he has been using his compression pumps but tells me he feels that the  drainage out of his leg is worse when he uses a compression pumps. To be fair to him he is been saying this for a while however I don't know that I have really been listening to this. I wonder if the compression pumps are working properly 12/22/17;. Once again he arrives with severe erythema, weeping edema from the left greater than right leg. Noncompliance with compression pumps. New this visit he is complaining of pain on the lateral aspect of the right leg and the medial aspect of his right thigh. He apparently saw his cardiologist Dr. [name] who was ordered an echocardiogram area and I think this is a step in the right direction 12/25/17; started his doxycycline Monday night. There is still intense erythema of the  right leg especially in the anterior thigh although there is less tenderness. The erythema around the wound on the right lateral calf also is less tender. He still complaining of pain in the left heel. His wounds are about the same right lateral left medial left lateral. Superficial but certainly not close to closure. He denies being systemically unwell no fever chills no abdominal pain no diarrhea 12/29/17; back in follow-up of his extensive right calf and right thigh cellulitis. I added amoxicillin to cover possible doxycycline resistant strep. This seems to of done the trick he is in much less pain there is much less erythema and swelling. He has his echocardiogram at 11:00 this morning. X-ray of the left heel was also negative. 01/05/18; the patient arrived with his edema under much better control. Now that he is retired he is able to use his compression pumps daily and sometimes twice a day per the patient. He has a wound on the right leg the lateral wound looks better. Area on the left leg also looks a lot better. He has no evidence of cellulitis in his bilateral thighs I had a quick peak at his echocardiogram. He is in normal ejection fraction and normal left ventricular function.  He has moderate pulmonary hypertension moderately reduced right ventricular function. One would have to wonder about chronic sleep apnea although he says he doesn't snore. He'll review the echocardiogram with his cardiologist. 01/12/18; the patient arrives with the edema in both legs under exemplary control. He is using his compression pumps daily and sometimes twice daily. His wound on the right lateral leg is just about closed. He still has some weeping areas on the posterior left calf and lateral left calf although everything is just about closed here as well. I have spoken with Aldean Baker who is the patient's nurse practitioner and infectious disease. She was concerned that the patient had not understood that the parenteral penicillin injections he was receiving for cellulitis prophylaxis was actually benefiting him. I don't think the patient actually saw that I would tend to agree we were certainly dealing with less infections although he had a serious one last month. 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 He is healed. He'll be placed in juxtalite compression wraps and increase his lymphedema pumps to twice daily. We will follow up again next week to ensure there are no issues with the new regiment. 01/20/18-he is here for evaluation of bilateral lower extremity weeping edema. Yesterday he was placed in compression wrap to the right lower extremity and compression stocking to left lower shrubbery. He states he uses lymphedema pumps last night and again this morning and noted a blister to the left lower extremity. On exam he was noted to have drainage to the right lower extremity. He will be placed in Unna boots bilaterally and follow-up next week 01/26/18; patient was actually discharged a week ago to his own juxta light stockings only to return the next day with bilateral lower extremity weeping edema.he was placed in bilateral Unna boots. He  arrives today with pain in the back of his left leg. There is no open area on the right leg however there is a linear/wrap injury on the left leg and weeping edema on the left leg posteriorly. I spoke with infectious disease about 10 days ago. They were disappointed that the patient elected to discontinue prophylactic intramuscular penicillin shots as they felt it was particularly beneficial in reducing the frequency of his cellulitis.  I discussed this with the patient today. He does not share this view. He'll definitely need antibiotics today. Finally he is traveling to [place] and trauma leaving this Saturday and returning a week later and he does not travel with his pumps. He is going by car 01/30/18; patient was seen 4 days ago and brought back in today for review of cellulitis in the left leg posteriorly. I put him on amoxicillin this really hasn't helped as much as I might like. He is also worried because he is traveling to [facility] by car. Finally we will be rewrapping him. There is no open area on the right leg over his left leg has multiple weeping areas as usual 02/09/18; The same wrap on for 10 days. He did not pick up the last doxycycline I prescribed for him. He apparently took 4 days worth he already had. There is nothing open on his right leg and the edema control is really quite good. He's had damage in the left leg medially and laterally especially probably related to the prolonged use of Unna boots 02/12/18; the patient arrived in clinic today for a nurse visit/wrap change. He complained of a lot of pain in the left posterior calf. He is taking doxycycline that I previously prescribed for him. Unfortunately even though he used his stockings and apparently used to compression pumps twice a day he has weeping edema coming out of the lateral part of his right leg. This is coming from the lower anterior lateral skin area. 02/16/18; the patient has finished his doxycycline and  will finish the amoxicillin 2 days. The area of cellulitis in the left calf posteriorly has resolved. He is no longer having any pain. He tells me he is using his compression pumps at least once a day sometimes twice. 02/23/18; the patient finished his doxycycline and Amoxil last week. On Friday he noticed a small erythematous circle about the size of a quarter on the left lower leg just above his ankle. This rapidly expanded and he now has erythema on the lateral and posterior part of the thigh. This is bright red. Also has an area on the dorsal foot just above his toes and a tender area just below the left popliteal fossa. He came off his prophylactic penicillin injections at his own insistence one or 2 months ago. This is obviously deteriorated since then 03/02/18; patient is on doxycycline and Amoxil. Culture I did last week of the weeping area on the back of his left calf grew group B strep. I have therefore renewed the amoxicillin 500 3 times a day for a further week. He has not been systemically unwell. Still complaining of an area of discomfort right under his left popliteal fossa. There is no open wound on the right leg. He tells me that he is using his pumps twice a day on most days 03/09/18; patient arrives in clinic today completing his amoxicillin today. The cellulitis on his left leg is better. Furthermore he tells me that he had intramuscular penicillin shots that his primary care office today. However he also states that the wrap on his right leg fell down shortly after leaving clinic last week. He developed a large blister that was present when he came in for a nurse visit later in the week and then he developed intense discomfort around this area.He tells me he is using his compression pumps 03/16/18; the patient has completed his doxycycline. The infectious part of this/cellulitis in the left heel area left  popliteal area is a lot better. He has 2 open areas on the right calf. Still  areas on the left calf but this is a lot better as well. 03/24/18; the patient arrives complaining of pain in the left popliteal area again. He thinks some of this is wrap injury. He has no open area on the right leg and really no open area on the left calf either except for the popliteal area. He claims to be compliant with the compression pumps 03/31/18; I gave him doxycycline last week because of cellulitis in the left popliteal area. This is a lot better although the surface epithelium is denuded off and response to this. He arrives today with uncontrolled edema in the right calf area as well as a fingernail injury in the right lateral calf. There is only a few open areas on the left 04/06/18; I gave him amoxicillin doxycycline over the last 2 weeks that the amoxicillin should be completing currently. He is not complaining of any pain or systemic symptoms. The only open areas see has is on the right lateral lower leg paradoxically I cannot see anything on the left lower leg. He tells me he is using his compression pumps twice a day on most days. Silver alginate to the wounds that are open under 4 layer compression 04/13/18; he completed antibiotics and has no new complaints. Using his compression pumps. Silver alginate that anything that's opened 04/20/18; he is using his compression pumps religiously. Silver alginate 4 layer compression anything that's opened. He comes in today with no open wounds on the left leg but 3 on the right including a new one posteriorly. He has 2 on the right lateral and one on the right posterior. He likes Unna boots on the right leg for reasons that aren't really clear we had the usual 4 layer compression on the left. It may be necessary to move to the 4 layer compression on the right however for now I left them in the Unna boots 04/27/18; he is using his compression pumps at least once a day. He has still the wounds on the right lateral calf. The area right posteriorly  has closed. He does not have an open wound on the left under 4 layer compression however on the dorsal left foot just proximal to the toes and the left third toe 2 small open areas were identified 05/11/18; he has not uses compression pumps. The areas on the right lateral calf have coalesced into one large wound necrotic surface. On the left side he has one small wound anteriorly however the edema is now weeping out of a large part of his left leg. He says he wasn't using his pumps because of the weeping fluid. I explained to him that this is the time he needs to pump more 05/18/18; patient states he is using his compression pumps twice a day. The area on the right lateral large wound albeit superficial. On the left side he has innumerable number of small new wounds on the left calf particularly laterally but several anteriorly and medially. All these appear to have healthy granulated base these look like the remnants of blisters however they occurred under compression. The patient arrives in clinic today with his legs somewhat better. There is certainly less edema, less multiple open areas on the left calf and the right anterior leg looks somewhat better as well superficial and a little smaller. However he relates pain and erythema over the last 3-4 days in the thigh and  I looked at this today. He has not been systemically unwell no fever no chills no change in blood sugar values 05/25/18; comes in today in a better state. The severe cellulitis on his left leg seems better with the Keflex. Not as tender. He has not been systemically unwell Hard to find an open wound on the left lower leg using his compression pumps twice a day The confluent wounds on his right lateral calf somewhat better looking. These will ultimately need debridement I didn't do this today. 06/01/18; the severe cellulitis on the left anterior thigh has resolved and he is completed his Keflex. There is no open wound on the left leg  however there is a superficial excoriation at the base of the third toe dorsally. Skin on the bottom of his left foot is macerated looking. The left the wounds on the lateral right leg actually looks some better although he did require debridement of the top half of this wound area with an open curet 06/09/18 on evaluation today patient appears to be doing poorly in regard to his right lower extremity in particular this appears to likely be infected he has very thick purulent discharge along with a bright green tent to the discharge. This makes me concerned about the possibility of pseudomonas. He's also having increased discomfort at this point on evaluation. Fortunately there does not appear to be any evidence of infection spreading to the other location at this time. 06/16/18 on evaluation today patient appears to actually be doing fairly well. His ulcer has actually diminished in size quite significantly at this point which is good news. Nonetheless he still does have some evidence of infection he did see infectious disease this morning before coming here for his appointment. I did review the results of their evaluation and their note today. They did actually have him discontinue the Cipro and initiate treatment with linezolid at this time. He is doing this for the next seven days and they recommended a follow-up in four months with them. He is the keep a log of the need for intermittent antibiotic therapy between now and when he falls back up with infectious disease. This will help them gaze what exactly they need to do to try and help them out. 06/23/18; the patient arrives today with no open wounds on the left leg and left third toe healed. He is been using his compression pumps twice a day. On the right lateral leg he still has a sizable wound but this is a lot better than last time I saw this. In my absence he apparently cultured MRSA coming from this wound and is completed a course of  linezolid as has been directed by infectious disease. Has been using silver alginate under 4 layer compression 06/30/18; the only open wound he has is on the right lateral leg and this looks healthy. No debridement is required. We have been using silver alginate. He does not have an open wound on the left leg. There is apparently some drainage from the dorsal proximal third toe on the left although I see no open wound here. 07/03/18 on evaluation today patient was actually here just for a nurse visit rapid change. However when he was here on Wednesday for his rat change due to having been healed on the left and then developing blisters we initiated the wrap again knowing that he would be back today for [place] to reevaluate and see were at. Unfortunately he has developed some cellulitis into the proximal portion  of his right lower extremity even into the region of his thigh. 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 He was placed on one as what at that point. Nonetheless he is done with that and has been tolerating it well otherwise. Doxycycline which in the past really did not seem to be effective for him. Nonetheless I think the best option may be for [place] to definitely reinitiate the antibiotics for a longer period of time. 07/07/18; since I last saw this patient a week ago he has had a difficult time. At that point he did not have an open wound on his left leg. We transitioned him into juxta light stockings. He was apparently in the clinic the next day with blisters on the left lateral and left medial lower calf. He also had weeping edema fluid. He was put back into a compression wrap. He was also in the clinic on Friday with intense erythema in his right thigh. Per the patient he was started on Bactrim however that didn't work at all in terms of relieving his pain and swelling. He has taken 3 doxycycline that he had left over from last time and that seems to of  helped. He has blistering on the right thigh as well. 07/14/18; the erythema on his right thigh has gotten better with doxycycline that he is finishing. 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 Presumably this cellulitis in the thigh was not related to that although I think this is a bit concerning going forward. He still has an area on the right lateral calf the blister on the right medial calf just below the knee that was discussed above. On the left 2 small open areas left medial and left lateral. Edema control is adequate. He is using his compression pumps twice a day 07/20/18; continued improvement in the condition of both legs especially the edema in his bilateral thighs. He tells me he is been losing weight through a combination of diet and exercise. He is using his compression pumps twice a day. So overall she made to the remaining wounds 07/27/2018; continued improvement in condition of both legs. His edema is well controlled. The area on the right lateral leg is just about closed he had one blisters show up on the medial left upper calf. We have him in 4 layer compression. He is going on a 10-day trip to [place], [place] and [place]. He will be driving. He wants to wear Unna boots because of the lessening amount of constriction. He will not use compression pumps while he is away 08/05/18 on evaluation today patient actually appears to be doing decently well all things considered in regard to his bilateral lower extremities. The worst ulcer is actually only posterior aspect of his left lower extremity with a four layer compression wrap cut into his leg a couple weeks back. He did have a trip and actually had [employer] for the trip that he is worn since he was last here. Nonetheless he feels like the [employer] actually do better for him his swelling is up a little bit but he also with his trip was  not taking his Lasix on a regular set schedule like he was supposed to be. He states that obviously the reason being that he cannot drive and keep going without having to urinate too frequently which makes  it difficult. He did not have his pumps with him while he was away either which I think also maybe playing a role here too. 08/13/2018; the patient only has a small open wound on the right lateral calf which is a big improvement in the last month or 2. He also has the area posteriorly just below the posterior fossa on the left which I think was a wrap injury from several weeks ago. He has no current evidence of cellulitis. He tells me he is back into his compression pumps twice a day. He also tells me that while he was at the laundromat somebody stole a section of his extremitease stockings 08/20/2018; back in the clinic with a much improved state. He only has small areas on the right lateral mid calf which is just about healed. This was is more substantial area for quite a prolonged period of time. He has a small open area on the left anterior tibia. The area on the posterior calf just below the popliteal fossa is closed today. He is using his compression pumps twice a day 08/28/2018; patient has no open wound on the right leg. He has a smattering of open areas on the calf with some weeping lymphedema. More problematically than that it looks as though his wraps of slipped down in his usual he has very angry upper area of edema just below the right medial knee and on the right lateral calf. He has no open area on his feet. The patient is traveling to [facility] next week. I will send him in an antibiotic. We will continue to wrap the right leg. We ordered extremitease stockings for him last week and I plan to transition the right leg to a stocking when he gets home which will be in 10 days time. As usual he is very reluctant to take his pumps with him when he travels 09/07/2018; patient  returns from [facility]. He shows me a picture of his left leg in the mid part of his trip last week with intense fire engine erythema. The picture look bad enough I would have considered sending him to the hospital. Instead he went to the wound care center in [facility]. They did not prescribe him antibiotics but he did take some doxycycline he had leftover from a previous visit. I had given him trimethoprim sulfamethoxazole before he left this did not work according to the patient. This is resulted in some improvement fortunately. He comes back with a large wound on the left posterior calf. Smaller area on the left anterior tibia. Denuded blisters on the dorsal left foot over his toes. Does not have much in the way of wounds on the right leg although he does have a very tender area on the right posterior area just below the popliteal fossa also suggestive of infection. He promises me he is back on his pumps twice a day 09/15/2018; the intense cellulitis in his left lower calf is a lot better. The wound area on the posterior left calf is also so better. However he has reasonably extensive wounds on the dorsal aspect of his second and third toes and the proximal foot just at the base of the toes. There is nothing open on the right leg 09/22/2018; the patient has excellent edema control in his legs bilaterally. He is using his external compression pumps twice a day. He has no open area on the right leg and only the areas in the left foot dorsally second and  third toe area on the left side. He does not have any signs of active cellulitis. 10/06/2018; the patient has good edema control bilaterally. He has no open wound on the right leg. There is a blister in the posterior aspect of his left calf that we had to deal with today. He is using his compression pumps twice a day. There is no signs of active cellulitis. We have been using silver alginate to the wound areas. He still  has vulnerable areas on the base of his left first second toes dorsally He has a his extremities stockings and we are going to transition him today into the stocking on the right leg. He is cautioned that he will need to continue to use the compression pumps twice a day. If he notices uncontrolled edema in the right leg he may need to go to 3 times a day. 10/13/2018; the patient came in for a nurse check on Friday he has a large flaccid blister on the right medial calf just below the knee. We unroofed this. He has this and a new area underneath the posterior mid calf which was undoubtedly a blister as well. He also has several small areas on the right which is the area we put his extremities stocking on. 10/19/2018; the patient went to see infectious disease this morning I am not sure if that was a routine follow-up in any case the doxycycline I had given him was discontinued and started on linezolid. He has not started this. It is easy to look at his left calf and the inflammation and think this is cellulitis however he is very tender in the tissue just below the popliteal fossa and I have no doubt that there is infection going on here. He states the problem he is having is that with the compression pumps the edema goes down and then starts walking the wrap falls down. We will see if we can adhere this. He has 1 or 2 minuscule open areas on the right still areas that are weeping on the posterior left calf, the base of his left second and third toes 10/26/18; back today in clinic with quite of skin breakdown in his left anterior leg. This may have been infection the area below the popliteal fossa seems a lot better however tremendous epithelial loss on the left anterior mid tibia area over quite inexpensive tissue. He has 2 blisters on the right side but no other open wound here. 10/29/2018; came in urgently to see [place] today and we worked him in for review. He states that the 4 layer compression  on the right leg caused pain he had to cut it down to roughly his mid calf this caused swelling above the wrap and he has blisters and skin breakdown today. As a result of the pain he has not been using his pumps. Both legs are a lot more edematous and there is a lot of weeping fluid. 11/02/18; arrives in clinic with continued difficulties in the right leg> left. Leg is swollen and painful. multiple skin blisters and new open areas especially laterally. He has not been using his pumps on the right leg. 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 He is not systemically unwell. 11/09/2018; the patient claims he is being compliant with his pumps. He is finished the doxycycline I gave him last week. Culture I did of the wound on the right lateral leg showed a few very resistant methicillin staph aureus.  This was resistant to doxycycline. Nevertheless he states the pain in the leg is a lot better which makes me wonder if the cultured organism was not really what was causing the problem nevertheless this is a very dangerous organism to be culturing out of any wound. His right leg is still a lot larger than the left. He is using an Radio broadcast assistant on this area, he blames a 4-layer compression for causing the original skin breakdown which I doubt is true however I cannot talk him out of it. We have been using silver alginate to all of these areas which were initially blisters 11/16/2018; patient is being compliant with his external compression pumps at twice a day. Miraculously he arrives in clinic today with absolutely no open wounds. He has better edema control on the left where he has been using 4 layer compression versus wound of wounds on the right and I pointed this out to him. There is no inflammation in the skin in his lower legs which is also somewhat unusual for him. There is no open wounds on the dorsal left foot. He has extremitease stockings at home and I  have asked him to bring these in next week. 11/25/18 patient's lower extremity on examination today on the left appears for the most part to be wound free. He does have an open wound on the lateral aspect of the right lower extremity but this is minimal compared to what I've seen in past. He does request that we go ahead and wrap the left leg as well even though there's nothing open just so hopefully it will not reopen in short order. 1/28; patient has superficial open wounds on the right lateral calf left anterior calf and left posterior calf. His edema control is adequate. 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 We have been using silver alginate as the primary dressing. He claims compliance with his compression pumps 2/4; patient has superficial open wounds on numerous areas of his left calf and again one on the left dorsal foot. The areas on the right lateral calf have healed. The cellulitis that I gave him doxycycline for last week is also resolved this was mostly on the left anterior calf just below the tibial tuberosity. His edema looks fairly well-controlled. He tells me he went to see his primary doctor today and had blood work ordered 2/11; once again he has several open areas on the left calf left tibial area. Most of these are small and appear to have healthy granulation. He does not have anything open on the right. The edema and control in his thighs is pretty good which is usually a good indication he has been using his pumps as requested. 2/18; he continues to have several small areas on the left calf and left tibial area. Most of these are small healthy granulation. We put him in his stocking on the right leg last week and he arrives with a superficial open area over the right upper tibia and a fairly large area on the right lateral tibia in similar condition. His edema control actually does not  look too bad, he claims to be using his compression pumps twice a day 2/25. Continued small areas on the left calf and left tibial area. New areas especially on the right are identified just below the tibial tuberosity and on the right upper tibia itself. There are also areas of weeping edema  fluid even without an obvious wound. He does not have a considerable degree of lymphedema but clearly there is more edema here than his skin can handle. He states he is using the pumps twice a day. We have an Unna boot on the right and 4 layer compression on the left. 3/3; he continues to have an area on the right lateral calf and right posterior calf just below the popliteal fossa. There is a fair amount of tenderness around the wound on the popliteal fossa but I did not see any evidence of cellulitis, could just be that the wrap came down and rubbed in this area. He does not have an open area on the left leg however there is an area on the left dorsal foot at the base of the third toe We have been using silver alginate to all wound areas 3/10; he did not have an open area on his left leg last time he was here a week ago. Today he arrives with a horizontal wound just below the tibial tuberosity and an area on the left lateral calf. He has intense erythema and tenderness in this area. The area is on the right lateral calf and right posterior calf better than last week. We have been using silver alginate as usual 3/18 - Patient returns with 3 small open areas on left calf, and 1 small open area on right calf, the skin looks ok with no significant erythema, he continues the UNA boot on right and 4 layer compression on left. The right lateral calf wound is closed , the right posterior is small area. we will continue silver alginate to the areas. Culture results from right posterior calf wound is + MRSA sensitive to Bactrim but resistant to DOXY 01/27/19 on evaluation today patient's bilateral lower extremities  actually appear to be doing fairly well at this point which is good news. He is been tolerating the dressing changes without complication. Fortunately she has made excellent improvement in regard to the overall status of his wounds. Unfortunately every time we cease wrapping him he ends up reopening in causing more significant issues at that point. Again I'm unsure of the best direction to take although I think the lymphedema clinic may be appropriate for him. 02/03/19 on evaluation today patient appears to be doing well in regard to the wounds that we saw him for last week unfortunately he has a new area on the proximal portion of his right medial/posterior lower extremity where the wrap somewhat slowed down and caused swelling and a blister to rub and open. Unfortunately this is the only opening that he has on either leg at this point. 02/17/19 on evaluation today patient's bilateral lower extremities appear to be doing well. He still completely healed in regard to the left lower extremity. In regard to the right lower extremity the area where the wrap and slid down and caused the blister still seems to be slightly open although this is dramatically better than during the last evaluation two weeks ago. I'm very pleased with the way this stands overall. 03/03/19 on evaluation today patient appears to be doing well in regard to his right lower extremity in general although he did have a new blister open this does not appear to be showing any evidence of active infection at this time. Fortunately there's No fevers, chills, nausea, or vomiting noted at this time. Overall I feel like he is making good progress it does feel like that the right leg will we perform  the [employer] seems to do with a bit better than three layer wrap on the left which slid down on him. We may switch to doing bilateral in the book wraps. 5/4; I have not seen Mr. Farve in quite some time. According to our case manager he did not  have an open wound on his left leg last week. He had 1 remaining wound on the right posterior medial calf. He arrives today with multiple openings on the left leg probably were blisters and/or wrap injuries from Unna boots. I do not think the Unna boot's will provide adequate compression on the left. I am also not clear about the frequency he is using the compression pumps. 03/17/19 on evaluation today patient appears to be doing excellent in regard to his lower extremities compared to last week's evaluation apparently. He had gotten significantly worse last week which is unfortunate. The [employer] wrap on the left did not seem to do very well for him at all and in fact it didn't control his swelling significantly enough he had an additional outbreak. Subsequently we go back to the four layer compression wrap on the left. This is good news. At least in that he is doing better and the wound seem to be killing him. He still has not heard anything from the lymphedema clinic. 03/24/19 on evaluation today patient actually appears to be doing much better in regard to his bilateral lower Trinity as compared to last week when I saw him. Fortunately there's no signs of active infection at this time. He has been tolerating the dressing changes without complication. Overall I'm extremely pleased with the progress and appearance in general. 04/07/19 on evaluation today patient appears to be doing well in regard to his bilateral lower extremities. His swelling is significantly down from where it was previous. With that being said he does have a couple blisters still open at this point but fortunately nothing that seems to be too severe and again the majority of the larger openings has healed at this time. 04/14/19 on evaluation today patient actually appears to be doing quite well in regard to his bilateral lower extremities in fact I'm not even sure there's anything significantly open at this time at any site.  Nonetheless he did have some trouble with these wraps where they are somewhat irritating him secondary to the fact that he has noted that the graph wasn't too close down to the end of this foot in a little bit short as well up to his knee. Otherwise things seem to be doing quite well. 04/21/19 upon evaluation today patient's wound bed actually showed evidence of being completely healed in regard to both lower extremities which is excellent news. There does not appear to be any signs of active infection which is also good news. I'm very pleased in this regard. No fevers, chills, nausea, or vomiting noted at this time. 04/28/19 on evaluation today patient appears to be doing a little bit worse in regard to both lower extremities on the left mainly due to the fact that when he went infection disease the wrap was not wrapped quite high enough he developed a blister above this. On the right he is a small open area of nothing too significant but again this is continuing to give him some trouble he has been were in the Velcro compression that he has at home. 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 He's been tolerating  the dressing changes without complication. Fortunately there's no signs of active infection at this time. No fevers, chills, nausea, or vomiting noted at this time. We have been trying to get an appointment with her lymphedema clinic in [facility] but unfortunately nobody can get them on phone with not been able to even fax information over the patient likewise is not been able to get in touch with them. Overall I'm not sure exactly what's going on here with to reach out again today. 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 Still having a lot of drainage unfortunately. He tells me especially in the left but even on the right.  There's no signs of active infection which is good news we've been using so ratcheted up to this point. 05/19/19 on evaluation today patient actually appears to be doing quite well with regard to his left lower extremity which is great news. Fortunately in regard to the right lower extremity has an issues with his wrap and he subsequently did remove this from what I'm understanding. Nonetheless long story short is what he had rewrapped once he removed it subsequently had maggots underneath this wrap whenever he came in for evaluation today. With that being said they were obviously completely cleaned away by the nursing staff. The visit today which is excellent news. However he does appear to potentially have some infection around the right ankle region where the maggots were located as well. He will likely require anabiotic therapy today. 05/26/19 on evaluation today patient actually appears to be doing much better in regard to his bilateral lower extremities. I feel like the infection is under much better control. With that being said there were maggots noted when the wrap was removed yet again today. Again this could have potentially been left over from previous although at this time there does not appear to be any signs of significant drainage there was obviously on the wrap some drainage as well this contracted gnats or otherwise. Either way I do not see anything that appears to be doing worse in my pinion and in fact I think his drainage has slowed down quite significantly likely mainly due to the fact to his infection being under better control. 06/02/2019 on evaluation today patient actually appears to be doing well with regard to his bilateral lower extremities there is no signs of active infection at this time which is great news. With that being said he does have several open areas more so on the right than the left but nonetheless these are all significantly better than previously  noted. 06/09/2019 on evaluation today patient actually appears to be doing well. His wrap stayed up and he did not cause any problems he had more drainage on the right compared to the left but overall I do not see any major issues at this time which is great news. 06/16/2019 on evaluation today patient appears to be doing excellent with regard to his lower extremities the only area that is open is a new blister that can have opened as of today on the medial ankle on the left. Other than this he really seems to be doing great I see no major issues at this point. 06/23/2019 on evaluation today patient appears to be doing quite well with regard to his bilateral lower extremities. In fact he actually appears to be almost completely healed there is a small area of weeping noted of the  right lower extremity just above the ankle. Nonetheless fortunately there is no signs of active infection at this time which is good news. No fevers, chills, nausea, vomiting, or diarrhea. 8/24; the patient arrived for a nurse visit today but complained of very significant pain in the left leg and therefore I was asked to look at this. Noted that he did not have an open area on the left leg last week nevertheless this was wrapped. The patient states that he is not been able to put his compression pumps on the left leg because of the discomfort. He has not been systemically unwell 06/30/2019 on evaluation today patient unfortunately despite being excellent last week is doing much worse with regard to his left lower extremity today. In fact he had to come in for a nurse on Monday where his left leg had to be rewrapped due to excessive weeping Dr. Leanord Hawking placed him on doxycycline at that point. Fortunately there is no signs of active infection Systemically at this time which is good news. 07/07/2019 in regard to the patient's wounds today he actually seems to be doing well with his right lower extremity there really is nothing  open or draining at this point this is great news. Unfortunately the left lower extremity is given him additional trouble at this time. There does not appear to be any signs of active infection nonetheless he does have a lot of edema and swelling noted at this point as well as blistering all of which has led to a much more poor appearing leg at this time compared to where it was 2 weeks ago when it was almost completely healed. Obviously this is a little discouraging for the patient. He is try to contact the lymphedema clinic in [place] he has not been able to get through to them. 07/14/2019 on evaluation today patient actually appears to be doing slightly better with regard to his left lower extremity ulcers. Overall I do feel like at least at the top of the wrap that we have been placing this area has healed quite nicely and looks much better. The remainder of the leg is showing signs of improvement. Unfortunately in the thigh area he still has an open region on the left and again on the right he has been utilizing just a Band-Aid on an area that also opened on the thigh. Again this is an area that were not able to wrap although we did do an Ace wrap to provide some compression that something that obviously is a little less effective than the compression wraps we have been using on the lower portion of the leg. He does have an appointment with the lymphedema clinic in [facility] on Friday. 07/21/2019 on evaluation today patient appears to be doing better with regard to his lower extremity ulcers. He has been tolerating the dressing changes without complication. Fortunately there is no signs of active infection at this time. No fevers, chills, nausea, vomiting, or diarrhea. I did receive the paperwork from the physical therapist at the lymphedema clinic in [place]. Subsequently I signed off on that this morning and sent that back to him for further progression with the treatment  plan. 07/28/2019 on evaluation today patient appears to be doing very well with regard to his right lower extremity where I do not see any open wounds at this point. Fortunately he is feeling great as far as that is concerned as well. In regard to the left lower extremity he has been having issues with  still several areas of weeping and edema although the upper leg is doing better his lower leg still I think is going require the compression wrap at this time. No fevers, chills, nausea, vomiting, or diarrhea. 08/04/2019 on evaluation today patient unfortunately is having new wounds on the right lower extremity. Again we have been using Unna boot wrap on that side. We switched him to using his juxta lite wrap at home. With that being said he tells me he has been using it although his legs extremely swollen and to be honest really does not appear that he has been. I cannot know that for sure however. Nonetheless he has multiple new wounds on the right lower extremity at this time. Obviously we will have to see about getting this rewrapped for him today. 08/11/2019 on evaluation today patient appears to be doing fairly well with regard to his wounds. He has been tolerating the dressing changes including the compression wraps without complication. He still has a lot of edema in his upper thigh regions bilaterally he is supposed to be seeing the lymphedema clinic on the 15th of this month once his wraps arrive for the upper part of his legs. 08/18/2019 on evaluation today patient appears to be doing well with regard to his bilateral lower extremities at this point. He has been tolerating the dressing changes without complication. Fortunately there is no signs of active infection which is also good news. He does have a couple weeping areas on the first and second toe of the right foot he also has just a small area on the left foot upper leg and a small area on the left lower leg but overall he is  doing quite well in my opinion. He is supposed to be getting his wraps shortly in fact tomorrow and then subsequently is seeing the lymphedema clinic next Wednesday on the 21st. Of note he is also leaving on the 25th to go on vacation for a week to the beach. For that reason and since there is some uncertainty about what there can be doing at lymphedema clinic next Wednesday I am get a make an appointment for next Friday here for [place] to see what we need to do for him prior to him leaving for vacation. 10/23; patient arrives in considerable pain predominantly in the upper posterior calf just distal to the popliteal fossa also in the wound anteriorly above the major wound. This is probably cellulitis and he has had this recurrently in the past. He has no open wound on the right side and he has had an Radio broadcast assistant in that area. Finally I note that he has an area on the left posterior calf which by enlarge is mostly epithelialized. This protrudes beyond the borders of the surrounding skin in the setting of dry scaly skin and lymphedema. The patient is leaving for [facility] on Sunday. Per his longstanding pattern, he will not take his compression pumps with him predominantly out of fear that they will be stolen. He therefore asked that we put a Unna boot back on the right leg. He will also contact the wound care center in [facility] to see if they can change his dressing in the mid week. 11/3; patient returned from his vacation to [facility]. He was seen on 1 occasion at their wound care center. They did a 2 layer compression system as they did not have our 4-layer wrap. I am not completely certain what they put on the wounds. They did  not change the Unna boot on the right. The patient is also seeing a lymphedema specialist physical therapist in [place]. It appears that he has some compression sleeve for his thighs which indeed look quite a bit better than I am used to seeing. He pumps over  these with his external compression pumps. 11/10; the patient has a new wound on the right medial thigh otherwise there is no open areas on the right. He has an area on the left leg posteriorly anteriorly and medially and an area over the left second toe. We have been using silver alginate. He thinks the injury on his thigh is secondary to friction from the compression sleeve he has. 11/17; the patient has a new wound on the right medial thigh last week. He thinks this is because he did not have a underlying stocking for his thigh juxta lite apparatus. He now has this. The area is fairly large and somewhat angry but I do not think he has underlying cellulitis. He has a intact blister on the right anterior tibial area. Small wound on the right great toe dorsally Small area on the medial left calf. 11/30; the patient does not have any open areas on his right leg and we did not take his juxta lite stocking off. However he states that on Friday his compression wrap fell down lodging around his upper mid calf area. As usual this creates a lot of problems for him. He called urgently today to be seen for a nurse visit however the nurse visit turned into a provider visit because of extreme erythema and pain in the left anterior tibia extending laterally and posteriorly. The area that is problematic is extensive 10/06/2019 upon evaluation today patient actually appears to be doing poorly in regard to his left lower extremity. He Dr. Leanord Hawking did place him on doxycycline this past Monday apparently due to the fact that he was doing much worse in regard to this left leg. Fortunately the doxycycline does seem to be helping. Unfortunately we are still having a very difficult time getting his edema under any type of control in order to anticipate discharge at some point. The only way were really able to control his lymphedema really is with compression wraps and that has only even seemingly temporary. He has been  seeing a lymphedema clinic they are trying to help in this regard but still this has been somewhat frustrating in general for the patient. 10/13/19 on evaluation today patient appears to be doing excellent with regard to his right lower extremity as far as the wounds are concerned. His swelling is still quite extensive unfortunately. He is still having a lot of drainage from the thigh areas bilaterally which is unfortunate. He's been going to lymphedema clinic but again he still really does not have this edema under control as far as his lower extremities are concern. With regard to his left lower extremity this seems to be improving and I do believe the doxycycline has been of benefit for him. He is about to complete the doxycycline. 10/20/2019 on evaluation today patient appears to be doing poorly in regard to his bilateral lower extremities. More in the right thigh he has a lot of irritation at this site unfortunately. In regard to the left lower extremity the wrap was not quite as high it appears and does seem to have caused him some trouble as well. Fortunately there is no evidence of systemic infection though he does have some blue-green drainage which has me  concerned for the possibility of Pseudomonas. He tells me he is previously taking Cipro without complications and he really does not care for Levaquin however due to some of the side effects he has. He is not allergic to any medications specifically antibiotics that were aware of. 10/27/2019 on evaluation today patient actually does appear to be for the most part doing better when compared to last week's evaluation. With that being said he still has multiple open wounds over the bilateral lower extremities. He actually forgot to start taking the Cipro and states that he still has the whole bottle. He does have several new blisters on left lower extremity today I think I would recommend he go ahead and take the Cipro based on what I  am seeing at this point. 12/30-Patient comes at 1 week visit, 4 layer compression wraps on the left and Unna boot on the right, primary dressing Xtrasorb and silver alginate. Patient is taking his Cipro and has a few more days left probably 5-6, and the legs are doing better. He states he is using his compressions devices which I believe he has Electronic Signature(s) Signed: 11/03/2019 11:44:00 AM By: Cassandria Anger MD, MBA Entered By: Cassandria Anger on 11/03/2019 11:43:59 -------------------------------------------------------------------------------- Physical Exam Details Patient Name: Date of Service: ARVILLE, POSTLEWAITE 11/03/2019 10:45 AM Medical Record [id number] Patient Account Number: [id number] Date of Birth/Sex: Treating RN: May 04, 1951 (68 y.o. M) Primary Care Provider: Nicoletta Ba Other Clinician: Referring Provider: Treating Provider/Extender:Shelanda Duvall, Francis Dowse, [name] in Treatment: 197 Constitutional alert and oriented x 3. sitting or standing blood pressure is within target range for patient.. supine blood pressure is within target range for patient.. pulse regular and within target range for patient.Marland Kitchen respirations regular, non-labored and within target range for patient.Marland Kitchen temperature within target range for patient.. . . Well-nourished and well-hydrated in no acute distress. Notes Left lower leg wounds close to the ankle with clean base, surrounding skin appears noninflamed, there is significant lymphedema and hyperkeratosis in the forefoot Right leg has a small pinhole on the second toe dorsal aspect other than that the rest of the area on the right leg looks intact and healed Electronic Signature(s) Signed: 11/03/2019 11:45:03 AM By: Cassandria Anger MD, MBA Entered By: Cassandria Anger on 11/03/2019 11:45:03 -------------------------------------------------------------------------------- Physician Orders Details Patient Name: Date of  Service: HAZIM, TREADWAY 11/03/2019 10:45 AM Medical Record [id number] Patient Account Number: [id number] Date of Birth/Sex: Treating RN: April 26, 1951 (68 y.o. 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 Dressing Change Frequency Do not change entire dressing for one week. - both lower legs Skin Barriers/Peri-Wound Care Wound #151 Left,Circumferential Lower Leg Barrier cream TCA Cream or Ointment Wound #153 Right,Medial Lower Leg TCA Cream or Ointment - mix with lotion right leg Wound Cleansing May shower with protection. Primary Wound Dressing Wound #151 Left,Circumferential Lower Leg Calcium Alginate with Silver Wound #152 Right Toe Second Calcium Alginate with Silver Wound #153 Right,Medial Lower Leg  Calcium Alginate with Silver Secondary Dressing Wound #151 Left,Circumferential Lower Leg ABD pad Other: - zetuvit Wound #152 Right Toe Second Kerlix/Rolled Gauze Drawtex Wound #153 Right,Medial Lower Leg ABD pad Edema Control 4 layer compression: Left lower extremity - unna boot at upper portion of lower leg. Unna Boot to Right Lower Extremity Avoid standing for long periods of time Elevate legs to the level of the heart or above for 30 minutes  daily and/or when sitting, a frequency of: - throughout the day Exercise regularly Segmental Compressive Device. - lymphadema pumps 60 min 2 times per day Electronic Signature(s) Signed: 11/03/2019 5:10:58 PM By: Cassandria Anger MD, MBA Signed: 11/03/2019 5:40:33 PM By: Zenaida Deed RN, BSN Entered By: Zenaida Deed on 11/03/2019 11:45:14 -------------------------------------------------------------------------------- Problem List Details Patient Name: Date of Service: ANUSH, WIEDEMAN 11/03/2019 10:45 AM Medical Record [id number] Patient Account Number: [id number] Date of Birth/Sex: Treating RN: 25-Jun-1951 (68 y.o. [name] Primary Care Provider: Nicoletta Ba Other Clinician: Referring Provider: Treating Provider/Extender:Christianna Belmonte, Francis Dowse, [name] in Treatment: 197 Active Problems ICD-10 Evaluated Encounter Code Description Active Date Today Diagnosis L97.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 right calf limited to 06/30/2018 No Yes breakdown of skin L97.221 Non-pressure chronic ulcer of left calf limited to 09/30/2016 No Yes breakdown of skin I87.333 Chronic venous hypertension (idiopathic) with ulcer 01/22/2016 No Yes and inflammation of bilateral lower extremity I89.0 Lymphedema, not elsewhere classified 01/22/2016 No Yes E11.622 Type 2 diabetes mellitus with other skin ulcer 01/22/2016 No Yes 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, 01/22/2016 No Yes unspecified L03.116 Cellulitis of left lower limb 04/01/2017 No Yes Inactive Problems ICD-10 Code Description Active Date Inactive Date 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 06/30/2017 skin L97.521 Non-pressure chronic ulcer of other part of left foot limited to 04/27/2018 04/27/2018 breakdown of skin L03.115 Cellulitis of right lower limb 12/22/2017 12/22/2017 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 with other specified 06/30/2018  06/30/2018 severity L97.511 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 06/30/2018 06/30/2018 breakdown of skin Resolved Problems Electronic Signature(s) Signed: 11/03/2019 5:10:58 PM By: Cassandria Anger MD, MBA Signed: 11/03/2019 5:40:33 PM By: Zenaida Deed RN, BSN Entered By: Zenaida Deed on 11/03/2019 11:42:24 -------------------------------------------------------------------------------- Progress Note Details Patient Name: Date of Service: KRISHAN, MCBREEN 11/03/2019 10:45 AM Medical Record [id number] Patient Account Number: [id number] Date of Birth/Sex: Treating RN: Mar 26, 1951 (68 y.o. M) Primary Care Provider: Nicoletta Ba Other Clinician: Referring Provider: Treating Provider/Extender:Andren Bethea, Francis Dowse, [name] in Treatment: 197 Subjective History of Present Illness (HPI) Referred by PCP for consultation. Patient has long standing history of BLE venous stasis, no prior ulcerations. At beginning of month, developed cellulitis and weeping. Received IM Rocephin followed by Keflex and resolved. Wears compression stocking, appr 6 months old. Not sure strength. No present drainage. 01/22/16 this is a patient who is a type II diabetic on insulin. 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 He tells me he religiously wears pressure stockings of uncertain strength. He was here with weeping edema about 8 months ago but did not have an open wound. Roughly a month ago he had a reopening on his bilateral legs. He is been using bandages and Neosporin. He does not complain of pain. 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 Last BUN/creatinine I have is from 11/20/15 at 13 and 1.0 respectively  01/29/16; patient arrives today having tolerated the Profore wrap. He brought in his stockings and these are 18  mmHg stockings he bought from [place]. The compression here is likely inadequate. He does not complain of pain or excessive drainage she has no systemic symptoms. The wound on the right looks improved as does the one on the left although one on the left is more substantial with still tissue at risk below the actual wound area on the bilateral posterior calf 02/05/16; patient arrives with poor edema control. He states that we did put a 4 layer compression on it last week. No weight appear 5 this. 02/12/16; the area on the posterior right Has healed. The left Has a substantial wound that has necrotic surface eschar that requires a debridement with a curette. 02/16/16;the patient called or a Nurse visit secondary to increased swelling. He had been in earlier in the week with his right leg healed. He was transitioned to is on pressure stocking on the right leg with the only open wound on the left, a substantial area on the left posterior calf. Note he has a history of severe lower extremity edema, he has a history of chronic atrial fibrillation but not heart failure per my notes but I'll need to research this. He is not complaining of chest pain shortness of breath or orthopnea. The intake nurse noted blisters on the previously closed right leg 02/19/16; this is the patient's regular visit day. I see him on Friday with escalating edema new wounds on the right leg and clear signs of at least right ventricular heart failure. I increased his Lasix to 40 twice a day. He is returning currently in follow-up. States he is noticed a decrease in that the edema 02/26/16 patient's legs have much less edema. There is nothing really open on the right leg. The left leg has improved condition of the large superficial wound on the posterior left leg 03/04/16; edema control is very much better. The patient's right leg wounds have healed. On the left leg he continues to have severe venous inflammation on the posterior aspect  of the left leg. There is no tenderness and I don't think any of this is cellulitis. 03/11/16; patient's right leg is married healed and he is in his own stocking. The patient's left leg has deteriorated somewhat. There is a lot of erythema around the wound on the posterior left leg. There is also a significant rim of erythema posteriorly just above where the wrap would've ended there is a new wound in this location and a lot of tenderness. Can't rule out cellulitis in this area. 03/15/16; patient's right leg remains healed and he is in his own stocking. The patient's left leg is much better than last review. His major wound on the posterior aspect of his left Is almost fully epithelialized. He has 3 small injuries from the wraps. Really. Erythema seems a lot better on antibiotics 03/18/16; right leg remains healed and he is in his own stocking. The patient's left leg is much better. The area on the posterior aspect of the left calf is fully epithelialized. His 3 small injuries which were wrap injuries on the left are improved only one seems still open his erythema has resolved 03/25/16; patient's right leg remains healed and he is in his own stocking. There is no open area today on the left leg posterior leg is completely closed up. His wrap injuries at the superior aspect of his leg are also resolved. He looks as  though he has some irritation on the dorsal ankle but this is fully epithelialized without evidence of infection. 03/28/16; we discharged this patient on Monday. Transitioned him into his own stocking. There were problems almost immediately with uncontrolled swelling weeping edema multiple some of which have opened. He does not feel systemically unwell in particular no chest pain no shortness of breath and he does not feel 04/08/16; the edema is under better control with the Profore light wrap but he still has pitting edema. There is one large wound anteriorly 2 on the medial aspect of his left  leg and 3 small areas on the superior posterior calf. Drainage is not excessive he is tolerating a Profore light well 04/15/16; put a Profore wrap on him last week. This is controlled is edema however he had a lot of pain on his left anterior foot most of his wounds are healed 04/22/16 once again the patient has denuded areas on the left anterior foot which he states are because his wrap slips up word. He saw his primary physician today is on Lasix 40 twice a day and states that he his weight is down 20 pounds over the last 3 months. 04/29/16: Much improved. left anterior foot much improved. He is now on Lasix 80 mg per day. Much improved edema control 05/06/16; I was hoping to be able to discharge him today however once again he has blisters at a low level of where the compression was placed last week mostly on his left lateral but also his left medial leg and a small area on the anterior part of the left foot. 05/09/16; apparently the patient went home after his appointment on 7/4 later in the evening developing pain in his upper medial thigh together with subjective fever and chills although his temperature was not taken. The pain was so intense he felt he would probably have to call 911. However he then remembered that he had leftover doxycycline from a previous round of antibiotics and took these. By the next morning he felt a lot better. He called and spoke to one of our nurses and I approved doxycycline over the phone thinking that this was in relation to the wounds we had previously seen although they were definitely were not. The patient feels a lot better old fever no chills he is still working. Blood sugars are reasonably controlled 05/13/16; patient is back in for review of his cellulitis on his anterior medial upper thigh. He is taking doxycycline this is a lot better. Culture I did of the nodular area on the dorsal aspect of his foot grew MRSA this also looks a lot better. 05/20/16; the  patient is cellulitis on the medial upper thigh has resolved. All of his wound areas including the left anterior foot, areas on the medial aspect of the left calf and the lateral aspect of the calf at all resolved. He has a new blister on the left dorsal foot at the level of the fourth toe this was excised. No evidence of infection 05/27/16; patient continues to complain weeping edema. He has new blisterlike wounds on the left anterior lateral and posterior lateral calf at the top of his wrap levels. The area on his left anterior foot appears better. He is not complaining of fever, pain or pruritus in his feet. 05/30/16; the patient's blisters on his left anterior leg posterior calf all look improved. He did not increase the Lasix 100 mg as I suggested because he was going to run out  of his 40 mg tablets. He is still having weeping edema of his toes 06/03/16; I renewed his Lasix at 80 mg once a day as he was about to run out when I last saw him. He is on 80 mg of Lasix now. I have asked him to cut down on the excessive amount of water he was drinking and asked him to drink according to his thirst mechanisms 06/12/2016 -- was seen 2 days ago and was supposed to wear his compression stockings at home but he is developed lymphedema and superficial blisters on the left lower extremity and hence came in for a review 06/24/16; the remaining wound is on his left anterior leg. He still has edema coming from between his toes. There is lymphedema here however his edema is generally better than when I last saw this. He has a history of atrial fibrillation but does not have a known history of congestive heart failure nevertheless I think he probably has this at least on a diastolic basis. 07/01/16 I reviewed his echocardiogram from January 2017. This was essentially normal. He did not have LVH, EF of 55-60%. His right ventricular function was normal although he did have trivial tricuspid and pulmonic  regurgitation. This is not audible on exam however. I increased his Lasix to do massive edema in his legs well above his knees I think in early July. He was also drinking an excessive amount of water at the time. 07/15/16; missed his appointment last week because of the Labor Day holiday on Monday. He could not get another appointment later in the week. Started to feel the wrap digging in superiorly so we remove the top half and the bottom half of his wrap. He has extensive erythema and blistering superiorly in the left leg. Very tender. Very swollen. Edema in his foot with leaking edema fluid. He has not been systemically unwell 07/22/16; the area on the left leg laterally required some debridement. The medial wounds look more stable. His wrap injury wounds appear to have healed. Edema and his foot is better, weeping edema is also better. He tells me he is meeting with the supplier of the external compression pumps at work 08/05/16; the patient was on vacation last week in [facility]. His wrap is been on for an extended period of time. Also over the weekend he developed an extensive area of tender erythema across his anterior medial thigh. He took to doxycycline yesterday that he had leftover from a previous prescription. The patient complains of weeping edema coming out of his toes 08/08/16; I saw this patient on 10/2. He was tender across his anterior thigh. I put him on doxycycline. He returns today in follow-up. He does not have any open wounds on his lower leg, he still has edema weeping into his toes. 08/12/16; patient was seen back urgently today to follow-up for his extensive left thigh cellulitis/erysipelas. He comes back with a lot less swelling and erythema pain is much better. I believe I gave him Augmentin and Cipro. His wrap was cut down as he stated a roll down his legs. He developed blistering above the level of the wrap that remained. He has 2 open blisters and 1 intact. 08/19/16;  patient is been doing his primary doctor who is increased his Lasix from 40-80 once a day or 80 already has less edema. Cellulitis has remained improved in the left thigh. 2 open areas on the posterior left calf 08/26/16; he returns today having new open blisters on  the anterior part of his left leg. He has his compression pumps but is not yet been shown how to use some vital representative from the supplier. 09/02/16 patient returns today with no open wounds on the left leg. Some maceration in his plantar toes 09/10/2016 -- Dr. Leanord Hawking had recently discharged him on 09/02/2016 and he has come right back with redness swelling and some open ulcers on his left lower extremity. He says this was caused by trying to apply his compression stockings and he's been unable to use this and has not been able to use his lymphedema pumps. He had some doxycycline leftover and he has started on this a few days ago. 09/16/16; there are no open wounds on his leg on the left and no evidence of cellulitis. 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 He does not complain of symptoms here. I am not clear use using his external compression pumps. 09/23/16; I have not seen this patient in 2 weeks. He canceled his appointment 10 days ago as he was going on vacation. He tells me that on Monday he noticed a large area on his posterior left leg which is been draining copiously and is reopened into a large wound. He is been using ABDs and the external part of his juxtalite, according to our nurse this was not on properly. 10/07/16; Still a substantial area on the posterior left leg. Using silver alginate 10/14/16; in general better although there is still open area which looks healthy. Still using silver alginate. He reminds me that this happen before he left for [facility]. Today while he was showering in the morning. He had been using his juxtalite's 10/21/16; the  area on his posterior left leg is fully epithelialized. However he arrives today with a large area of tender erythema in his medial and posterior left thigh just above the knee. I have marked the area. Once again he is reluctant to consider hospitalization. I treated him with oral antibiotics in the past for a similar situation with resolution I think with doxycycline however this area it seems more extensive to me. He is not complaining of fever but does have chills and says states he is thirsty. His blood sugar today was in the 140s at home 10/25/16 the area on his posterior left leg is fully epithelialized although there is still some weeping edema. The large area of tenderness and erythema in his medial and posterior left thigh is a lot less tender although there is still a lot of swelling in this thigh. He states he feels a lot better. He is on doxycycline and Augmentin that I started last week. This will continued until Tuesday, December 26. I have ordered a duplex ultrasound of the left thigh rule out DVT whether there is an abscess something that would need to be drained I would also like to know. 11/01/16; he still has weeping edema from a not fully epithelialized area on his left posterior calf. Most of the rest of this looks a lot better. He has completed his antibiotics. His thigh is a lot better. Duplex ultrasound did not show a DVT in the thigh 11/08/16; he comes in today with more Denuded surface epithelium from the posterior aspect of his calf. There is no real evidence of cellulitis. The superior aspect of his wrap appears to have put quite an indentation in his leg just below the knee and this may have contributed. He does not  complain of pain or fever. We have been using silver alginate as the primary dressing. The area of cellulitis in the right thigh has totally resolved. He has been using his compression stockings once a week 11/15/16; the patient arrives today with more loss of  epithelium from the posterior aspect of his left calf. He now has a fairly substantial wound in this area. The reason behind this deterioration isn't exactly clear although his edema is not well controlled. He states he feels he is generally more swollen systemically. He is not complaining of chest pain shortness of breath fever. Tells me he has an appointment with his primary physician in early February. He is on 80 mg of oral Lasix a day. He claims compliance with the external compression pumps. He is not having any pain in his legs similar to what he has with his recurrent cellulitis 11/22/16; the patient arrives a follow-up of his large area on his left lateral calf. This looks somewhat better today. He came in earlier in the week for a dressing change since I saw him a week ago. He is not complaining of any pain no shortness of breath no chest pain 11/28/16; the patient arrives for follow-up of his large area on the left lateral calf this does not look better. In fact it is larger weeping edema. The surface of the wound does not look too bad. We have been using silver alginate although I'm not certain that this is a dressing issue. 12/05/16; again the patient follows up for a large wound on the left lateral and left posterior calf this does not look better. There continues to be weeping edema necrotic surface tissue. More worrisome than this once again there is erythema below the wound involving the distal Achilles and heel suggestive of cellulitis. He is on his feet working most of the day of this is not going well. We are changing his dressing twice a week to facilitate the drainage. 12/12/16; not much change in the overall dimensions of the large area on the left posterior calf. This is very inflamed looking. I gave him an. Doxycycline last week does not really seem to have helped. He found the wrap very painful indeed it seems to of dog into his legs superiorly and perhaps around the heel. He  came in early today because the drainage had soaked through his dressings. 12/19/16- patient arrives for follow-up evaluation of his left lower extremity ulcers. He states that he is using his lymphedema pumps once daily when there is "no drainage". He admits to not using his lipedema pumps while under current treatment. His blood sugars have been consistently between 150-200. 12/26/16; the patient is not using his compression pumps at home because of the wetness on his feet. I've advised him that I think it's important for him to use this daily. He finds his feet too wet, he can put a plastic bag over his legs while he is in the pumps. Otherwise I think will be in a vicious circle. We are using silver alginate to the major area on his left posterior calf 01/02/17; the patient's posterior left leg has further of all into 3 open wounds. All of them covered with a necrotic surface. He claims to be using his compression pumps once a day. His edema control is marginal. Continue with silver alginate 01/10/17; the patient's left posterior leg actually looks somewhat better. There is less edema, less erythema. Still has 3 open areas covered with a necrotic surface requiring  debridement. He claims to be using his compression pumps once a day his edema control is better 01/17/17; the patient's left posterior calf look better last week when I saw him and his wrap was changed 2 days ago. He has noted increasing pain in the left heel and arrives today with much larger wounds extensive erythema extending down into the entire heel area especially tender medially. He is not systemically unwell CBGs have been controlled no fever. Our intake nurse showed me limegreen drainage on his AVD pads. 01/24/17; his usual this patient responds nicely to antibiotics last week giving him Levaquin for presumed Pseudomonas. The whole entire posterior part of his leg is much better much less inflamed and in the case of his Achilles  heel area much less tender. He has also had some epithelialization posteriorly there are still open areas here and still draining but overall considerably better 01/31/17- He has continue to tolerate the compression wraps. he states that he continues to use the lymphedema pumps daily, and can increase to twice daily on the weekends. He is voicing no complaints or concerns regarding his LLE ulcers 02/07/17-he is here for follow-up evaluation. He states that he noted some erythema to the left medial and anterior thigh, which he states is new as of yesterday. He is concerned about recurrent cellulitis. He states his blood sugars have been slightly elevated, this morning in the 180s 02/14/17; he is here for follow-up evaluation. When he was last here there was erythema superiorly from his posterior wound in his anterior thigh. He was prescribed Levaquin however a culture of the wound surface grew MRSA over the phone I changed him to doxycycline on Monday and things seem to be a lot better. 02/24/17; patient missed his appointment on Friday therefore we changed his nurse visit into a physician visit today. Still using silver alginate on the large area of the posterior left thigh. He isn't new area on the dorsal left second toe 03/03/17; actually better today although he admits he has not used his external compression pumps in the last 2 days or so because of work responsibilities over the weekend. 03/10/17; continued improvement. External compression pumps once a day almost all of his wounds have closed on the posterior left calf. Better edema control 03/17/17; in general improved. He still has 3 small open areas on the lateral aspect of his left leg however most of the area on the posterior part of his leg is epithelialized. He has better edema control. He has an ABD pad under his stocking on the right anterior lower leg although he did not let us look at that today. 03/24/17; patient arrives back in clinic  today with no open areas however there are areas on the posterior left calf and anterior left calf that are less than 100% epithelialized. His edema is well controlled in the left lower leg. There is some pitting edema probably lymphedema in the left upper thigh. He uses compression pumps at home once per day. I tried to get him to do this twice a day although he is very reticent. 04/01/2017 -- for the last 2 days he's had significant redness, tenderness and weeping and came in for an urgent visit today. 04/07/17; patient still has 6 more days of doxycycline. He was seen by Dr. Con Memos last Wednesday for cellulitis involving the posterior aspect, lateral aspect of his Involving his heel. For the most part he is better there is less erythema and less weeping. He has been on  his feet for 12 hours o2 over the weekend. Using his compression pumps once a day 04/14/17 arrives today with continued improvement. Only one area on the posterior left calf that is not fully epithelialized. He has intense bilateral venous inflammation associated with his chronic venous insufficiency disease and secondary lymphedema. We have been using silver alginate to the left posterior calf wound In passing he tells [place] today that the right leg but we have not seen in quite some time has an open area on it but he doesn't want [place] to look at this today states he will show this to [place] next week. 04/21/17; there is no open area on his left leg although he still reports some weeping edema. He showed [place] his right leg today which is the first time we've seen this leg in a long time. He has a large area of open wound on the right leg anteriorly healthy granulation. Quite a bit of swelling in the right leg and some degree of venous inflammation. He told us about the right leg in passing last week but states that deterioration in the right leg really only happened over the weekend 04/28/17; there is no open area on the left leg although  there is an irritated part on the posterior which is like a wrap injury. The wound on the right leg which was new from last week at least to [place] is a lot better. 05/05/17; still no open area on the left leg. Patient is using his new compression stocking which seems to be doing a good job of controlling the edema. He states he is using his compression pumps once per day. The right leg still has an open wound although it is better in terms of surface area. Required debridement. A lot of pain in the posterior right Achilles marked tenderness. Usually this type of presentation this patient gives concern for an active cellulitis 05/12/17; patient arrives today with his major wound from last week on the right lateral leg somewhat better. Still requiring debridement. He was using his compression stocking on the left leg however that is reopened with superficial wounds anteriorly he did not have an open wound on this leg previously. He is still using his juxta light's once daily at night. He cannot find the time to do this in the morning as he has to be at work by 7 AM 05/19/17; right lateral leg wound looks improved. No debridement required. The concerning area is on the left posterior leg which appears to almost have a subcutaneous hemorrhagic component to it. We've been using silver alginate to all the wounds 05/26/17; the right lateral leg wound continues to look improved. However the area on the left posterior calf is a tightly adherent surface. [place] using silver alginate. Because of the weeping edema in his legs there is very little good alternatives. 06/02/17; the patient left here last week looking quite good. Major wound on the left posterior calf and a small one on the right lateral calf. Both of these look satisfactory. He tells me that by Wednesday he had noted increased pain in the left leg and drainage. He called on Thursday and Friday to get an appointment here but we were blocked. He did not  go to urgent care or his primary physician. He thinks he had a fever on Thursday but did not actually take his temperature. He has not been using his compression pumps on the left leg because of pain. I advised him to go to  the emergency room today for IV antibiotics for stents of left leg cellulitis but he has refused I have asked him to take 2 days off work to keep his leg elevated and he has refused this as well. In view of this I'm going to call him and Augmentin and doxycycline. 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 However after a detailed discussion regarding the risk benefits and alternatives he refuses to get admitted to the hospital. With no other recourse I will continue him on oral antibiotics as before and hopefully he'll have his infectious disease consultation this week. 06/16/2017 -- the patient was seen today by the nurse practitioner at infectious disease Ms. Dixon. 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 He has also been advise Lotrimin cream locally. she also had wise IV antibiotics for his condition if it flares up 06/23/17; patient arrives today with drainage bilaterally although the remaining wound on the left posterior calf after cleaning up today "highlighter yellow drainage" did not look too bad. Unfortunately he has had breakdown on the right anterior leg [previously this leg had not been open and he is using a black stocking] he went to see infectious disease and is been put on clindamycin 150 mg daily, I did not verify the dose although I'm not  familiar with using clindamycin in this dosing range, perhaps for prophylaxisoo 06/27/17; I brought this patient back today to follow-up on the wound deterioration on the right lower leg together with surrounding cellulitis. I started him on doxycycline 4 days ago. This area looks better however he comes in today with intense cellulitis on the medial part of his left thigh. This is not have a wound in this area. Extremely tender. We've been using silver alginate to the wounds on the right lower leg left lower leg with bilateral 4 layer compression he is using his external compression pumps once a day 07/04/17; patient's left medial thigh cellulitis looks better. He has not been using his compression pumps as his insert said it was contraindicated with cellulitis. His right leg continues to make improvements all the wounds are still open. We only have one remaining wound on the left posterior calf. Using silver alginate to all open areas. He is on doxycycline which I started a week ago and should be finishing I gave him Augmentin after Thursday's visit for the severe cellulitis on the left medial thigh which fortunately looks better 07/14/17; the patient's left medial thigh cellulitis has resolved. The cellulitis in his right lower calf on the right also looks better. All of his wounds are stable to improved we've been using silver alginate he has completed the antibiotics I have given him. He has clindamycin 150 mg once a day prescribed by infectious disease for prophylaxis, I've advised him to start this now. We have been using bilateral Unna boots over silver alginate to the wound areas 07/21/17; the patient is been to see infectious disease who noted his recurrent problems with cellulitis. He was not able to tolerate prophylactic clindamycin therefore he is on amoxicillin 500  twice a day. He also had a second daily dose of Lasix added By Dr. Oneta Rack but he is not taking this. Nor is he being  completely compliant with his compression pumps a especially not this week. He has 2 remaining wounds one on the right posterior lateral lower leg and one on the left posterior medial lower leg. 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 The patient has Unna boots bilaterally. 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 He has bilateral Unna boots for compression and silver alginate to his wounds. Arrives today with his legs looking as good as I have seen him in quite some time. Not surprisingly his wounds look better as well with improvement on the right lateral leg venous insufficiency wound and also the left medial leg. He is still using the compression pumps once a day 08/11/17; both legs appear to be doing better wounds on the right lateral and left medial legs look better. Skin on the right leg quite good. He is been using silver alginate as the primary dressing. I'm going to use Anasept gel calcium alginate and maintain all the secondary dressings 08/18/17; the patient continues to actually do quite well. The area on his right lateral leg is just about closed the left medial also looks better although it is still moist in this area. His edema is well controlled we have been using Anasept gel with calcium alginate and the usual secondary dressings, 4 layer compression and once daily use of his compression pumps "always been able to manage 09/01/17; the patient continues to do reasonably well in spite of his trip to [place]. The area on the right lateral leg is epithelialized. Left is much better but still open. He has more edema and more chronic erythema on the left leg [venous inflammation] 09/08/17; he arrives today with no open wound on the right lateral leg and  decently controlled edema. Unfortunately his left leg is not nearly as in his good situation as last week.he apparently had increasing edema starting on Saturday. He edema soaked through into his foot so used a plastic bag to walk around his home. The area on the medial right leg which was his open area is about the same however he has lost surface epithelium on the left lateral which is new and he has significant pain in the Achilles area of the left foot. He is already on amoxicillin chronically for prophylaxis of cellulitis in the left leg 09/15/17; he is completed a week of doxycycline and the cellulitis in the left posterior leg and Achilles area is as usual improved. He still has a lot of edema and fluid soaking through his dressings. There is no open wound on the right leg. He saw infectious disease NP today 09/22/17;As usual 1 we transition him from our compression wraps to his stockings things did not go well. He has several small open areas on the right leg. He states this was caused by the compression wrap on his skin although he did not wear this with the stockings over them. He has several superficial areas on the left leg medially laterally posteriorly. He does not have any evidence of active cellulitis especially involving the left Achilles The patient is traveling from [facility] Saturday going to [facility]. He states he  isn't attempting to get an appointment with a heel objects wound center there to change his dressings. I am not completely certain whether this will work 10/06/17; the patient came in on Friday for a nurse visit and the nurse reported that his legs actually look quite good. He arrives in clinic today for his regular follow-up visit. He has a new wound on his left third toe over the PIP probably caused by friction with his footwear. He has small areas on the left leg and a very superficial but epithelialized area on the right anterior lateral lower leg. Other than  that his legs look as good as I've seen him in quite some time. We have been using silver alginate Review of systems; no chest pain no shortness of breath other than this a 10 point review of systems negative 10/20/17; seen by Dr. Meyer Russel last week. He had taken some antibiotics [doxycycline] that he had left over. Dr. Meyer Russel thought he had candida infection and declined to give him further antibiotics. He has a small wound remaining on the right lateral leg several areas on the left leg including a larger area on the left posterior several left medial and anterior and a small wound on the left lateral. The area on the left dorsal third toe looks a lot better. ROS; Gen.; no fever, respiratory no cough no sputum Cardiac no chest pain other than this 10 point review of system is negative 10/30/17; patient arrives today having fallen in the bathtub 3 days ago. It took him a while to get up. He has pain and maceration in the wounds on his left leg which have deteriorated. He has not been using his pumps he also has some maceration on the right lateral leg. 11/03/17; patient continues to have weeping edema especially in the left leg. This saturates his dressings which were just put on on 12/27. As usual the doxycycline seems to take care of the cellulitis on his lower leg. He is not complaining of fever, chills, or other systemic symptoms. He states his leg feels a lot better on the doxycycline I gave him empirically. He also apparently gets injections at his primary doctor's officeo Rocephin for cellulitis prophylaxis. I didn't ask him about his compression pump compliance today I think that's probably marginal. Arrives in the clinic with all of his dressings primary and secondary macerated full of fluid and he has bilateral edema 11/10/17; the patient's right leg looks some better although there is still a cluster of wounds on the right lateral. The left leg is inflamed with almost circumferential skin  loss medially to laterally although we are still maintaining anteriorly. He does not have overt cellulitis there is a lot of drainage. He is not using compression pumps. We have been using silver alginate to the wound areas, there are not a lot of options here 11/17/17; the patient's right leg continues to be stable although there is still open wounds, better than last week. The inflammation in the left leg is better. Still loss of surface layer epithelium especially posteriorly. There is no overt cellulitis in the amount of edema and his left leg is really quite good, tells me he is using his compression pumps once a day. 11/24/17; patient's right leg has a small superficial wound laterally this continues to improve. The inflammation in the left leg is still improving however we have continuous surface layer epithelial loss posteriorly. There is no overt cellulitis in the amount of edema in both legs is  really quite good. He states he is using his compression pumps on the left leg once a day for 5 out of 7 days 12/01/17; very small superficial areas on the right lateral leg continue to improve. Edema control in both legs is better today. He has continued loss of surface epithelialization and left posterior calf although I think this is better. We have been using silver alginate with large number of absorptive secondary dressings 4 layer on the left Unna boot on the right at his request. He tells me he is using his compression pumps once a day 12/08/17; he has no open area on the right leg is edema control is good here. ooOn the left leg however he has marked erythema and tenderness breakdown of skin. He has what appears to be a wrap injury just distal to the popliteal fossa. This is the pattern of his recurrent cellulitis area and he apparently received penicillin at his primary physician's office really worked in my view but usually response to doxycycline given it to him several times in the  past 12/15/17; the patient had already deteriorated last Friday when he came in for his nurse check. There was swelling erythema and breakdown in the right leg. He has much worse skin breakdown in the left leg as well multiple open areas medially and posteriorly as well as laterally. He tells me he has been using his compression pumps but tells me he feels that the drainage out of his leg is worse when he uses a compression pumps. To be fair to him he is been saying this for a while however I don't know that I have really been listening to this. I wonder if the compression pumps are working properly 12/22/17;. Once again he arrives with severe erythema, weeping edema from the left greater than right leg. Noncompliance with compression pumps. New this visit he is complaining of pain on the lateral aspect of the right leg and the medial aspect of his right thigh. He apparently saw his cardiologist Dr. [name] who was ordered an echocardiogram area and I think this is a step in the right direction 12/25/17; started his doxycycline Monday night. There is still intense erythema of the right leg especially in the anterior thigh although there is less tenderness. The erythema around the wound on the right lateral calf also is less tender. He still complaining of pain in the left heel. His wounds are about the same right lateral left medial left lateral. Superficial but certainly not close to closure. He denies being systemically unwell no fever chills no abdominal pain no diarrhea 12/29/17; back in follow-up of his extensive right calf and right thigh cellulitis. I added amoxicillin to cover possible doxycycline resistant strep. This seems to of done the trick he is in much less pain there is much less erythema and swelling. He has his echocardiogram at 11:00 this morning. X-ray of the left heel was also negative. 01/05/18; the patient arrived with his edema under much better control. Now that he is retired he  is able to use his compression pumps daily and sometimes twice a day per the patient. He has a wound on the right leg the lateral wound looks better. Area on the left leg also looks a lot better. He has no evidence of cellulitis in his bilateral thighs I had a quick peak at his echocardiogram. He is in normal ejection fraction and normal left ventricular function. He has moderate pulmonary hypertension moderately reduced right ventricular function. One  would have to wonder about chronic sleep apnea although he says he doesn't snore. He'll review the echocardiogram with his cardiologist. 01/12/18; the patient arrives with the edema in both legs under exemplary control. He is using his compression pumps daily and sometimes twice daily. His wound on the right lateral leg is just about closed. He still has some weeping areas on the posterior left calf and lateral left calf although everything is just about closed here as well. I have spoken with Aldean Baker who is the patient's nurse practitioner and infectious disease. She was concerned that the patient had not understood that the parenteral penicillin injections he was receiving for cellulitis prophylaxis was actually benefiting him. I don't think the patient actually saw that I would tend to agree we were certainly dealing with less infections although he had a serious one last month. 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 He is healed. He'll be placed in juxtalite compression wraps and increase his lymphedema pumps to twice daily. We will follow up again next week to ensure there are no issues with the new regiment. 01/20/18-he is here for evaluation of bilateral lower extremity weeping edema. Yesterday he was placed in compression wrap to the right lower extremity and compression stocking to left lower shrubbery. He states he uses lymphedema pumps last night and again this morning and noted a blister to  the left lower extremity. On exam he was noted to have drainage to the right lower extremity. He will be placed in Unna boots bilaterally and follow-up next week 01/26/18; patient was actually discharged a week ago to his own juxta light stockings only to return the next day with bilateral lower extremity weeping edema.he was placed in bilateral Unna boots. He arrives today with pain in the back of his left leg. There is no open area on the right leg however there is a linear/wrap injury on the left leg and weeping edema on the left leg posteriorly. I spoke with infectious disease about 10 days ago. They were disappointed that the patient elected to discontinue prophylactic intramuscular penicillin shots as they felt it was particularly beneficial in reducing the frequency of his cellulitis. I discussed this with the patient today. He does not share this view. He'll definitely need antibiotics today. Finally he is traveling to [place] and trauma leaving this Saturday and returning a week later and he does not travel with his pumps. He is going by car 01/30/18; patient was seen 4 days ago and brought back in today for review of cellulitis in the left leg posteriorly. I put him on amoxicillin this really hasn't helped as much as I might like. He is also worried because he is traveling to [facility] trauma by car. Finally we will be rewrapping him. There is no open area on the right leg over his left leg has multiple weeping areas as usual 02/09/18; The same wrap on for 10 days. He did not pick up the last doxycycline I prescribed for him. He apparently took 4 days worth he already had. There is nothing open on his right leg and the edema control is really quite good. He's had damage in the left leg medially and laterally especially probably related to the prolonged use of Unna boots 02/12/18; the patient arrived in clinic today for a nurse visit/wrap change. He complained of a lot of pain in the  left posterior calf. He is taking doxycycline that I previously prescribed for him. Unfortunately  even though he used his stockings and apparently used to compression pumps twice a day he has weeping edema coming out of the lateral part of his right leg. This is coming from the lower anterior lateral skin area. 02/16/18; the patient has finished his doxycycline and will finish the amoxicillin 2 days. The area of cellulitis in the left calf posteriorly has resolved. He is no longer having any pain. He tells me he is using his compression pumps at least once a day sometimes twice. 02/23/18; the patient finished his doxycycline and Amoxil last week. On Friday he noticed a small erythematous circle about the size of a quarter on the left lower leg just above his ankle. This rapidly expanded and he now has erythema on the lateral and posterior part of the thigh. This is bright red. Also has an area on the dorsal foot just above his toes and a tender area just below the left popliteal fossa. He came off his prophylactic penicillin injections at his own insistence one or 2 months ago. This is obviously deteriorated since then 03/02/18; patient is on doxycycline and Amoxil. Culture I did last week of the weeping area on the back of his left calf grew group B strep. I have therefore renewed the amoxicillin 500 3 times a day for a further week. He has not been systemically unwell. Still complaining of an area of discomfort right under his left popliteal fossa. There is no open wound on the right leg. He tells me that he is using his pumps twice a day on most days 03/09/18; patient arrives in clinic today completing his amoxicillin today. The cellulitis on his left leg is better. Furthermore he tells me that he had intramuscular penicillin shots that his primary care office today. However he also states that the wrap on his right leg fell down shortly after leaving clinic last week. He developed a large  blister that was present when he came in for a nurse visit later in the week and then he developed intense discomfort around this area.He tells me he is using his compression pumps 03/16/18; the patient has completed his doxycycline. The infectious part of this/cellulitis in the left heel area left popliteal area is a lot better. He has 2 open areas on the right calf. Still areas on the left calf but this is a lot better as well. 03/24/18; the patient arrives complaining of pain in the left popliteal area again. He thinks some of this is wrap injury. He has no open area on the right leg and really no open area on the left calf either except for the popliteal area. He claims to be compliant with the compression pumps 03/31/18; I gave him doxycycline last week because of cellulitis in the left popliteal area. This is a lot better although the surface epithelium is denuded off and response to this. He arrives today with uncontrolled edema in the right calf area as well as a fingernail injury in the right lateral calf. There is only a few open areas on the left 04/06/18; I gave him amoxicillin doxycycline over the last 2 weeks that the amoxicillin should be completing currently. He is not complaining of any pain or systemic symptoms. The only open areas see has is on the right lateral lower leg paradoxically I cannot see anything on the left lower leg. He tells me he is using his compression pumps twice a day on most days. Silver alginate to the wounds that are open under  4 layer compression 04/13/18; he completed antibiotics and has no new complaints. Using his compression pumps. Silver alginate that anything that's opened 04/20/18; he is using his compression pumps religiously. Silver alginate 4 layer compression anything that's opened. He comes in today with no open wounds on the left leg but 3 on the right including a new one posteriorly. He has 2 on the right lateral and one on the right posterior. He  likes Unna boots on the right leg for reasons that aren't really clear we had the usual 4 layer compression on the left. It may be necessary to move to the 4 layer compression on the right however for now I left them in the Unna boots 04/27/18; he is using his compression pumps at least once a day. He has still the wounds on the right lateral calf. The area right posteriorly has closed. He does not have an open wound on the left under 4 layer compression however on the dorsal left foot just proximal to the toes and the left third toe 2 small open areas were identified 05/11/18; he has not uses compression pumps. The areas on the right lateral calf have coalesced into one large wound necrotic surface. On the left side he has one small wound anteriorly however the edema is now weeping out of a large part of his left leg. He says he wasn't using his pumps because of the weeping fluid. I explained to him that this is the time he needs to pump more 05/18/18; patient states he is using his compression pumps twice a day. The area on the right lateral large wound albeit superficial. On the left side he has innumerable number of small new wounds on the left calf particularly laterally but several anteriorly and medially. All these appear to have healthy granulated base these look like the remnants of blisters however they occurred under compression. The patient arrives in clinic today with his legs somewhat better. There is certainly less edema, less multiple open areas on the left calf and the right anterior leg looks somewhat better as well superficial and a little smaller. However he relates pain and erythema over the last 3-4 days in the thigh and I looked at this today. He has not been systemically unwell no fever no chills no change in blood sugar values 05/25/18; comes in today in a better state. The severe cellulitis on his left leg seems better with the Keflex. Not as tender. He has not been  systemically unwell ooHard to find an open wound on the left lower leg using his compression pumps twice a day ooThe confluent wounds on his right lateral calf somewhat better looking. These will ultimately need debridement I didn't do this today. 06/01/18; the severe cellulitis on the left anterior thigh has resolved and he is completed his Keflex. ooThere is no open wound on the left leg however there is a superficial excoriation at the base of the third toe dorsally. Skin on the bottom of his left foot is macerated looking. ooThe left the wounds on the lateral right leg actually looks some better although he did require debridement of the top half of this wound area with an open curet 06/09/18 on evaluation today patient appears to be doing poorly in regard to his right lower extremity in particular this appears to likely be infected he has very thick purulent discharge along with a bright green tent to the discharge. This makes me concerned about the possibility of pseudomonas. He's also  having increased discomfort at this point on evaluation. Fortunately there does not appear to be any evidence of infection spreading to the other location at this time. 06/16/18 on evaluation today patient appears to actually be doing fairly well. His ulcer has actually diminished in size quite significantly at this point which is good news. Nonetheless he still does have some evidence of infection he did see infectious disease this morning before coming here for his appointment. I did review the results of their evaluation and their note today. They did actually have him discontinue the Cipro and initiate treatment with linezolid at this time. He is doing this for the next seven days and they recommended a follow-up in four months with them. He is the keep a log of the need for intermittent antibiotic therapy between now and when he falls back up with infectious disease. This will help them gaze what exactly  they need to do to try and help them out. 06/23/18; the patient arrives today with no open wounds on the left leg and left third toe healed. He is been using his compression pumps twice a day. On the right lateral leg he still has a sizable wound but this is a lot better than last time I saw this. 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 Has been using silver alginate under 4 layer compression 06/30/18; the only open wound he has is on the right lateral leg and this looks healthy. No debridement is required. We have been using silver alginate. He does not have an open wound on the left leg. There is apparently some drainage from the dorsal proximal third toe on the left although I see no open wound here. 07/03/18 on evaluation today patient was actually here just for a nurse visit rapid change. However when he was here on Wednesday for his rat change due to having been healed on the left and then developing blisters we initiated the wrap again knowing that he would be back today for [place] to reevaluate and see were at. Unfortunately he has developed some cellulitis into the proximal portion of his right lower extremity even into the region of his thigh. 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 He was placed on one as what at that point. Nonetheless he is done with that and has been tolerating it well otherwise. Doxycycline which in the past really did not seem to be effective for him. Nonetheless I think the best option may be for [place] to definitely reinitiate the antibiotics for a longer period of time. 07/07/18; since I last saw this patient a week ago he has had a difficult time. At that point he did not have an open wound on his left leg. We transitioned him into juxta light stockings. He was apparently in the clinic the next day with blisters on the  left lateral and left medial lower calf. He also had weeping edema fluid. He was put back into a compression wrap. He was also in the clinic on Friday with intense erythema in his right thigh. Per the patient he was started on Bactrim however that didn't work at all in terms of relieving his pain and swelling. He has taken 3 doxycycline that he had left over from last time and that seems to of helped. He has blistering on the right thigh as  well. 07/14/18; the erythema on his right thigh has gotten better with doxycycline that he is finishing. 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 Presumably this cellulitis in the thigh was not related to that although I think this is a bit concerning going forward. He still has an area on the right lateral calf the blister on the right medial calf just below the knee that was discussed above. On the left 2 small open areas left medial and left lateral. Edema control is adequate. He is using his compression pumps twice a day 07/20/18; continued improvement in the condition of both legs especially the edema in his bilateral thighs. He tells me he is been losing weight through a combination of diet and exercise. He is using his compression pumps twice a day. So overall she made to the remaining wounds 07/27/2018; continued improvement in condition of both legs. His edema is well controlled. The area on the right lateral leg is just about closed he had one blisters show up on the medial left upper calf. We have him in 4 layer compression. He is going on a 10-day trip to [place], [place] and [place]. He will be driving. He wants to wear Unna boots because of the lessening amount of constriction. He will not use compression pumps while he is away 08/05/18 on evaluation today patient actually appears to be doing decently well all things considered in regard to his bilateral lower  extremities. The worst ulcer is actually only posterior aspect of his left lower extremity with a four layer compression wrap cut into his leg a couple weeks back. He did have a trip and actually had [employer] for the trip that he is worn since he was last here. Nonetheless he feels like the [employer] actually do better for him his swelling is up a little bit but he also with his trip was not taking his Lasix on a regular set schedule like he was supposed to be. He states that obviously the reason being that he cannot drive and keep going without having to urinate too frequently which makes it difficult. He did not have his pumps with him while he was away either which I think also maybe playing a role here too. 08/13/2018; the patient only has a small open wound on the right lateral calf which is a big improvement in the last month or 2. He also has the area posteriorly just below the posterior fossa on the left which I think was a wrap injury from several weeks ago. He has no current evidence of cellulitis. He tells me he is back into his compression pumps twice a day. He also tells me that while he was at the laundromat somebody stole a section of his extremitease stockings 08/20/2018; back in the clinic with a much improved state. He only has small areas on the right lateral mid calf which is just about healed. This was is more substantial area for quite a prolonged period of time. He has a small open area on the left anterior tibia. The area on the posterior calf just below the popliteal fossa is closed today. He is using his compression pumps twice a day 08/28/2018; patient has no open wound on the right leg. He has a smattering of open areas on the calf with some weeping lymphedema. More problematically than that it looks as though his wraps of  slipped down in his usual he has very angry upper area of edema just below the right medial knee and on the right lateral calf. He has no  open area on his feet. The patient is traveling to [facility] next week. I will send him in an antibiotic. We will continue to wrap the right leg. We ordered extremitease stockings for him last week and I plan to transition the right leg to a stocking when he gets home which will be in 10 days time. As usual he is very reluctant to take his pumps with him when he travels 09/07/2018; patient returns from [facility]. He shows me a picture of his left leg in the mid part of his trip last week with intense fire engine erythema. The picture look bad enough I would have considered sending him to the hospital. Instead he went to the wound care center in [facility]. They did not prescribe him antibiotics but he did take some doxycycline he had leftover from a previous visit. I had given him trimethoprim sulfamethoxazole before he left this did not work according to the patient. This is resulted in some improvement fortunately. He comes back with a large wound on the left posterior calf. Smaller area on the left anterior tibia. Denuded blisters on the dorsal left foot over his toes. Does not have much in the way of wounds on the right leg although he does have a very tender area on the right posterior area just below the popliteal fossa also suggestive of infection. He promises me he is back on his pumps twice a day 09/15/2018; the intense cellulitis in his left lower calf is a lot better. The wound area on the posterior left calf is also so better. However he has reasonably extensive wounds on the dorsal aspect of his second and third toes and the proximal foot just at the base of the toes. There is nothing open on the right leg 09/22/2018; the patient has excellent edema control in his legs bilaterally. He is using his external compression pumps twice a day. He has no open area on the right leg and only the areas in the left foot dorsally second and third toe area on the left side. He does  not have any signs of active cellulitis. 10/06/2018; the patient has good edema control bilaterally. He has no open wound on the right leg. There is a blister in the posterior aspect of his left calf that we had to deal with today. He is using his compression pumps twice a day. There is no signs of active cellulitis. We have been using silver alginate to the wound areas. He still has vulnerable areas on the base of his left first second toes dorsally He has a his extremities stockings and we are going to transition him today into the stocking on the right leg. He is cautioned that he will need to continue to use the compression pumps twice a day. If he notices uncontrolled edema in the right leg he may need to go to 3 times a day. 10/13/2018; the patient came in for a nurse check on Friday he has a large flaccid blister on the right medial calf just below the knee. We unroofed this. He has this and a new area underneath the posterior mid calf which was undoubtedly a blister as well. He also has several small areas on the right which is the area we put his extremities stocking on. 10/19/2018; the patient went  to see infectious disease this morning I am not sure if that was a routine follow-up in any case the doxycycline I had given him was discontinued and started on linezolid. He has not started this. It is easy to look at his left calf and the inflammation and think this is cellulitis however he is very tender in the tissue just below the popliteal fossa and I have no doubt that there is infection going on here. He states the problem he is having is that with the compression pumps the edema goes down and then starts walking the wrap falls down. We will see if we can adhere this. He has 1 or 2 minuscule open areas on the right still areas that are weeping on the posterior left calf, the base of his left second and third toes 10/26/18; back today in clinic with quite of skin breakdown in his left  anterior leg. This may have been infection the area below the popliteal fossa seems a lot better however tremendous epithelial loss on the left anterior mid tibia area over quite inexpensive tissue. He has 2 blisters on the right side but no other open wound here. 10/29/2018; came in urgently to see [place] today and we worked him in for review. He states that the 4 layer compression on the right leg caused pain he had to cut it down to roughly his mid calf this caused swelling above the wrap and he has blisters and skin breakdown today. As a result of the pain he has not been using his pumps. Both legs are a lot more edematous and there is a lot of weeping fluid. 11/02/18; arrives in clinic with continued difficulties in the right leg> left. Leg is swollen and painful. multiple skin blisters and new open areas especially laterally. He has not been using his pumps on the right leg. 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 He is not systemically unwell. 11/09/2018; the patient claims he is being compliant with his pumps. He is finished the doxycycline I gave him last week. Culture I did of the wound on the right lateral leg showed a few very resistant methicillin staph aureus. This was resistant to doxycycline. Nevertheless he states the pain in the leg is a lot better which makes me wonder if the cultured organism was not really what was causing the problem nevertheless this is a very dangerous organism to be culturing out of any wound. His right leg is still a lot larger than the left. He is using an Radio broadcast assistant on this area, he blames a 4-layer compression for causing the original skin breakdown which I doubt is true however I cannot talk him out of it. We have been using silver alginate to all of these areas which were initially blisters 11/16/2018; patient is being compliant with his external compression pumps at twice a day. Miraculously he arrives in  clinic today with absolutely no open wounds. He has better edema control on the left where he has been using 4 layer compression versus wound of wounds on the right and I pointed this out to him. There is no inflammation in the skin in his lower legs which is also somewhat unusual for him. There is no open wounds on the dorsal left foot. He has extremitease stockings at home and I have asked him to bring these in next week. 11/25/18 patient's lower extremity on examination today on the left appears for the most part  to be wound free. He does have an open wound on the lateral aspect of the right lower extremity but this is minimal compared to what I've seen in past. He does request that we go ahead and wrap the left leg as well even though there's nothing open just so hopefully it will not reopen in short order. 1/28; patient has superficial open wounds on the right lateral calf left anterior calf and left posterior calf. His edema control is adequate. 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 We have been using silver alginate as the primary dressing. He claims compliance with his compression pumps 2/4; patient has superficial open wounds on numerous areas of his left calf and again one on the left dorsal foot. The areas on the right lateral calf have healed. The cellulitis that I gave him doxycycline for last week is also resolved this was mostly on the left anterior calf just below the tibial tuberosity. His edema looks fairly well-controlled. He tells me he went to see his primary doctor today and had blood work ordered 2/11; once again he has several open areas on the left calf left tibial area. Most of these are small and appear to have healthy granulation. He does not have anything open on the right. The edema and control in his thighs is pretty good which is usually a good indication he has been using his  pumps as requested. 2/18; he continues to have several small areas on the left calf and left tibial area. Most of these are small healthy granulation. We put him in his stocking on the right leg last week and he arrives with a superficial open area over the right upper tibia and a fairly large area on the right lateral tibia in similar condition. His edema control actually does not look too bad, he claims to be using his compression pumps twice a day 2/25. Continued small areas on the left calf and left tibial area. New areas especially on the right are identified just below the tibial tuberosity and on the right upper tibia itself. There are also areas of weeping edema fluid even without an obvious wound. He does not have a considerable degree of lymphedema but clearly there is more edema here than his skin can handle. He states he is using the pumps twice a day. We have an Unna boot on the right and 4 layer compression on the left. 3/3; he continues to have an area on the right lateral calf and right posterior calf just below the popliteal fossa. There is a fair amount of tenderness around the wound on the popliteal fossa but I did not see any evidence of cellulitis, could just be that the wrap came down and rubbed in this area. ooHe does not have an open area on the left leg however there is an area on the left dorsal foot at the base of the third toe ooWe have been using silver alginate to all wound areas 3/10; he did not have an open area on his left leg last time he was here a week ago. Today he arrives with a horizontal wound just below the tibial tuberosity and an area on the left lateral calf. He has intense erythema and tenderness in this area. The area is on the right lateral calf and right posterior calf better than last week. We have been using silver alginate as usual 3/18 -  Patient returns with 3 small open areas on left calf, and 1 small open area on right calf, the skin looks  ok with no significant erythema, he continues the UNA boot on right and 4 layer compression on left. The right lateral calf wound is closed , the right posterior is small area. we will continue silver alginate to the areas. 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 He is been tolerating the dressing changes without complication. Fortunately she has made excellent improvement in regard to the overall status of his wounds. Unfortunately every time we cease wrapping him he ends up reopening in causing more significant issues at that point. Again I'm unsure of the best direction to take although I think the lymphedema clinic may be appropriate for him. 02/03/19 on evaluation today patient appears to be doing well in regard to the wounds that we saw him for last week unfortunately he has a new area on the proximal portion of his right medial/posterior lower extremity where the wrap somewhat slowed down and caused swelling and a blister to rub and open. Unfortunately this is the only opening that he has on either leg at this point. 02/17/19 on evaluation today patient's bilateral lower extremities appear to be doing well. He still completely healed in regard to the left lower extremity. In regard to the right lower extremity the area where the wrap and slid down and caused the blister still seems to be slightly open although this is dramatically better than during the last evaluation two weeks ago. I'm very pleased with the way this stands overall. 03/03/19 on evaluation today patient appears to be doing well in regard to his right lower extremity in general although he did have a new blister open this does not appear to be showing any evidence of active infection at this time. Fortunately there's No fevers,  chills, nausea, or vomiting noted at this time. Overall I feel like he is making good progress it does feel like that the right leg will we perform the [employer] seems to do with a bit better than three layer wrap on the left which slid down on him. We may switch to doing bilateral in the book wraps. 5/4; I have not seen Mr. Donnan in quite some time. According to our case manager he did not have an open wound on his left leg last week. He had 1 remaining wound on the right posterior medial calf. He arrives today with multiple openings on the left leg probably were blisters and/or wrap injuries from Unna boots. I do not think the Unna boot's will provide adequate compression on the left. I am also not clear about the frequency he is using the compression pumps. 03/17/19 on evaluation today patient appears to be doing excellent in regard to his lower extremities compared to last week's evaluation apparently. He had gotten significantly worse last week which is unfortunate. The [employer] wrap on the left did not seem to do very well for him at all and in fact it didn't control his swelling significantly enough he had an additional outbreak. Subsequently we go back to the four layer compression wrap on the left. This is good news. At least in that he is doing better and the wound seem to be killing him. He still has not  heard anything from the lymphedema clinic. 03/24/19 on evaluation today patient actually appears to be doing much better in regard to his bilateral lower Trinity as compared to last week when I saw him. Fortunately there's no signs of active infection at this time. He has been tolerating the dressing changes without complication. Overall I'm extremely pleased with the progress and appearance in general. 04/07/19 on evaluation today patient appears to be doing well in regard to his bilateral lower extremities. His swelling is significantly down from where it was previous. With that being  said he does have a couple blisters still open at this point but fortunately nothing that seems to be too severe and again the majority of the larger openings has healed at this time. 04/14/19 on evaluation today patient actually appears to be doing quite well in regard to his bilateral lower extremities in fact I'm not even sure there's anything significantly open at this time at any site. Nonetheless he did have some trouble with these wraps where they are somewhat irritating him secondary to the fact that he has noted that the graph wasn't too close down to the end of this foot in a little bit short as well up to his knee. Otherwise things seem to be doing quite well. 04/21/19 upon evaluation today patient's wound bed actually showed evidence of being completely healed in regard to both lower extremities which is excellent news. There does not appear to be any signs of active infection which is also good news. I'm very pleased in this regard. No fevers, chills, nausea, or vomiting noted at this time. 04/28/19 on evaluation today patient appears to be doing a little bit worse in regard to both lower extremities on the left mainly due to the fact that when he went infection disease the wrap was not wrapped quite high enough he developed a blister above this. On the right he is a small open area of nothing too significant but again this is continuing to give him some trouble he has been were in the Velcro compression that he has at home. 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 He's been tolerating the dressing changes without complication. Fortunately there's no signs of active infection at this time. No fevers, chills, nausea, or vomiting noted at this time. We have been trying to get an appointment with her lymphedema clinic in [facility] but unfortunately nobody can get them on phone with not been able to even fax  information over the patient likewise is not been able to get in touch with them. Overall I'm not sure exactly what's going on here with to reach out again today. 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 Still having a lot of drainage unfortunately. He tells me especially in the left but even on the right. There's no signs of active infection which is good news we've been using so ratcheted up to this point. 05/19/19 on evaluation today patient actually appears to be doing quite well with regard to his left lower extremity which is great news. Fortunately in regard to the right lower extremity has an issues with his wrap and he subsequently did remove this from what I'm understanding. Nonetheless long story short is what he had rewrapped once he removed it subsequently had maggots underneath this wrap whenever he came in for evaluation today. With that being  said they were obviously completely cleaned away by the nursing staff. The visit today which is excellent news. However he does appear to potentially have some infection around the right ankle region where the maggots were located as well. He will likely require anabiotic therapy today. 05/26/19 on evaluation today patient actually appears to be doing much better in regard to his bilateral lower extremities. I feel like the infection is under much better control. With that being said there were maggots noted when the wrap was removed yet again today. Again this could have potentially been left over from previous although at this time there does not appear to be any signs of significant drainage there was obviously on the wrap some drainage as well this contracted gnats or otherwise. Either way I do not see anything that appears to be doing worse in my pinion and in fact I think his drainage has slowed down quite significantly likely mainly due to the fact to  his infection being under better control. 06/02/2019 on evaluation today patient actually appears to be doing well with regard to his bilateral lower extremities there is no signs of active infection at this time which is great news. With that being said he does have several open areas more so on the right than the left but nonetheless these are all significantly better than previously noted. 06/09/2019 on evaluation today patient actually appears to be doing well. His wrap stayed up and he did not cause any problems he had more drainage on the right compared to the left but overall I do not see any major issues at this time which is great news. 06/16/2019 on evaluation today patient appears to be doing excellent with regard to his lower extremities the only area that is open is a new blister that can have opened as of today on the medial ankle on the left. Other than this he really seems to be doing great I see no major issues at this point. 06/23/2019 on evaluation today patient appears to be doing quite well with regard to his bilateral lower extremities. In fact he actually appears to be almost completely healed there is a small area of weeping noted of the right lower extremity just above the ankle. Nonetheless fortunately there is no signs of active infection at this time which is good news. No fevers, chills, nausea, vomiting, or diarrhea. 8/24; the patient arrived for a nurse visit today but complained of very significant pain in the left leg and therefore I was asked to look at this. Noted that he did not have an open area on the left leg last week nevertheless this was wrapped. The patient states that he is not been able to put his compression pumps on the left leg because of the discomfort. He has not been systemically unwell 06/30/2019 on evaluation today patient unfortunately despite being excellent last week is doing much worse with regard to his left lower extremity today. In fact he had  to come in for a nurse on Monday where his left leg had to be rewrapped due to excessive weeping Dr. Leanord Hawking placed him on doxycycline at that point. Fortunately there is no signs of active infection Systemically at this time which is good news. 07/07/2019 in regard to the patient's wounds today he actually seems to be doing well with his right lower extremity there really is nothing open or draining at this point this is great news. Unfortunately the left lower extremity is given him additional trouble  at this time. There does not appear to be any signs of active infection nonetheless he does have a lot of edema and swelling noted at this point as well as blistering all of which has led to a much more poor appearing leg at this time compared to where it was 2 weeks ago when it was almost completely healed. Obviously this is a little discouraging for the patient. He is try to contact the lymphedema clinic in [place] he has not been able to get through to them. 07/14/2019 on evaluation today patient actually appears to be doing slightly better with regard to his left lower extremity ulcers. Overall I do feel like at least at the top of the wrap that we have been placing this area has healed quite nicely and looks much better. The remainder of the leg is showing signs of improvement. Unfortunately in the thigh area he still has an open region on the left and again on the right he has been utilizing just a Band-Aid on an area that also opened on the thigh. Again this is an area that were not able to wrap although we did do an Ace wrap to provide some compression that something that obviously is a little less effective than the compression wraps we have been using on the lower portion of the leg. He does have an appointment with the lymphedema clinic in [facility] on Friday. 07/21/2019 on evaluation today patient appears to be doing better with regard to his lower extremity ulcers. He has been  tolerating the dressing changes without complication. Fortunately there is no signs of active infection at this time. No fevers, chills, nausea, vomiting, or diarrhea. I did receive the paperwork from the physical therapist at the lymphedema clinic in [place]. Subsequently I signed off on that this morning and sent that back to him for further progression with the treatment plan. 07/28/2019 on evaluation today patient appears to be doing very well with regard to his right lower extremity where I do not see any open wounds at this point. Fortunately he is feeling great as far as that is concerned as well. In regard to the left lower extremity he has been having issues with still several areas of weeping and edema although the upper leg is doing better his lower leg still I think is going require the compression wrap at this time. No fevers, chills, nausea, vomiting, or diarrhea. 08/04/2019 on evaluation today patient unfortunately is having new wounds on the right lower extremity. Again we have been using Unna boot wrap on that side. We switched him to using his juxta lite wrap at home. With that being said he tells me he has been using it although his legs extremely swollen and to be honest really does not appear that he has been. I cannot know that for sure however. Nonetheless he has multiple new wounds on the right lower extremity at this time. Obviously we will have to see about getting this rewrapped for him today. 08/11/2019 on evaluation today patient appears to be doing fairly well with regard to his wounds. He has been tolerating the dressing changes including the compression wraps without complication. He still has a lot of edema in his upper thigh regions bilaterally he is supposed to be seeing the lymphedema clinic on the 15th of this month once his wraps arrive for the upper part of his legs. 08/18/2019 on evaluation today patient appears to be doing well with regard to his  bilateral lower extremities  at this point. He has been tolerating the dressing changes without complication. Fortunately there is no signs of active infection which is also good news. He does have a couple weeping areas on the first and second toe of the right foot he also has just a small area on the left foot upper leg and a small area on the left lower leg but overall he is doing quite well in my opinion. He is supposed to be getting his wraps shortly in fact tomorrow and then subsequently is seeing the lymphedema clinic next Wednesday on the 21st. Of note he is also leaving on the 25th to go on vacation for a week to the beach. For that reason and since there is some uncertainty about what there can be doing at lymphedema clinic next Wednesday I am get a make an appointment for next Friday here for [place] to see what we need to do for him prior to him leaving for vacation. 10/23; patient arrives in considerable pain predominantly in the upper posterior calf just distal to the popliteal fossa also in the wound anteriorly above the major wound. This is probably cellulitis and he has had this recurrently in the past. He has no open wound on the right side and he has had an Radio broadcast assistant in that area. Finally I note that he has an area on the left posterior calf which by enlarge is mostly epithelialized. This protrudes beyond the borders of the surrounding skin in the setting of dry scaly skin and lymphedema. The patient is leaving for [facility] on Sunday. Per his longstanding pattern, he will not take his compression pumps with him predominantly out of fear that they will be stolen. He therefore asked that we put a Unna boot back on the right leg. He will also contact the wound care center in [facility] to see if they can change his dressing in the mid week. 11/3; patient returned from his vacation to [facility]. He was seen on 1 occasion at their wound care center. They did a 2 layer compression  system as they did not have our 4-layer wrap. I am not completely certain what they put on the wounds. They did not change the Unna boot on the right. The patient is also seeing a lymphedema specialist physical therapist in [place]. It appears that he has some compression sleeve for his thighs which indeed look quite a bit better than I am used to seeing. He pumps over these with his external compression pumps. 11/10; the patient has a new wound on the right medial thigh otherwise there is no open areas on the right. He has an area on the left leg posteriorly anteriorly and medially and an area over the left second toe. We have been using silver alginate. He thinks the injury on his thigh is secondary to friction from the compression sleeve he has. 11/17; the patient has a new wound on the right medial thigh last week. He thinks this is because he did not have a underlying stocking for his thigh juxta lite apparatus. He now has this. The area is fairly large and somewhat angry but I do not think he has underlying cellulitis. ooHe has a intact blister on the right anterior tibial area. ooSmall wound on the right great toe dorsally ooSmall area on the medial left calf. 11/30; the patient does not have any open areas on his right leg and we did not take his juxta lite stocking off. However he  states that on Friday his compression wrap fell down lodging around his upper mid calf area. As usual this creates a lot of problems for him. He called urgently today to be seen for a nurse visit however the nurse visit turned into a provider visit because of extreme erythema and pain in the left anterior tibia extending laterally and posteriorly. The area that is problematic is extensive 10/06/2019 upon evaluation today patient actually appears to be doing poorly in regard to his left lower extremity. He Dr. Leanord Hawking did place him on doxycycline this past Monday apparently due to the fact that he was  doing much worse in regard to this left leg. Fortunately the doxycycline does seem to be helping. Unfortunately we are still having a very difficult time getting his edema under any type of control in order to anticipate discharge at some point. The only way were really able to control his lymphedema really is with compression wraps and that has only even seemingly temporary. He has been seeing a lymphedema clinic they are trying to help in this regard but still this has been somewhat frustrating in general for the patient. 10/13/19 on evaluation today patient appears to be doing excellent with regard to his right lower extremity as far as the wounds are concerned. His swelling is still quite extensive unfortunately. He is still having a lot of drainage from the thigh areas bilaterally which is unfortunate. He's been going to lymphedema clinic but again he still really does not have this edema under control as far as his lower extremities are concern. With regard to his left lower extremity this seems to be improving and I do believe the doxycycline has been of benefit for him. He is about to complete the doxycycline. 10/20/2019 on evaluation today patient appears to be doing poorly in regard to his bilateral lower extremities. More in the right thigh he has a lot of irritation at this site unfortunately. In regard to the left lower extremity the wrap was not quite as high it appears and does seem to have caused him some trouble as well. Fortunately there is no evidence of systemic infection though he does have some blue-green drainage which has me concerned for the possibility of Pseudomonas. He tells me he is previously taking Cipro without complications and he really does not care for Levaquin however due to some of the side effects he has. He is not allergic to any medications specifically antibiotics that were aware of. 10/27/2019 on evaluation today patient actually does appear to be for the  most part doing better when compared to last week's evaluation. With that being said he still has multiple open wounds over the bilateral lower extremities. He actually forgot to start taking the Cipro and states that he still has the whole bottle. He does have several new blisters on left lower extremity today I think I would recommend he go ahead and take the Cipro based on what I am seeing at this point. 12/30-Patient comes at 1 week visit, 4 layer compression wraps on the left and Unna boot on the right, primary dressing Xtrasorb and silver alginate. Patient is taking his Cipro and has a few more days left probably 5-6, and the legs are doing better. He states he is using his compressions devices which I believe he has Objective Constitutional alert and oriented x 3. sitting or standing blood pressure is within target range for patient.. supine blood pressure is within target range for patient.. pulse regular and  within target range for patient.Marland Kitchen respirations regular, non-labored and within target range for patient.Marland Kitchen temperature within target range for patient.. Well-nourished and well-hydrated in no acute distress. Vitals Time Taken: 11:12 AM, Height: 70 in, Weight: 380.2 lbs, BMI: 54.5, Temperature: 97.9 F, Pulse: 49 bpm, Respiratory Rate: 18 breaths/min, Blood Pressure: 158/60 mmHg, Capillary Blood Glucose: 189 mg/dl. General Notes: glucose per pt report General Notes: Left lower leg wounds close to the ankle with clean base, surrounding skin appears noninflamed, there is significant lymphedema and hyperkeratosis in the forefoot Right leg has a small pinhole on the second toe dorsal aspect other than that the rest of the area on the right leg looks intact and healed Integumentary (Hair, Skin) Wound #145 status is Healed - Epithelialized. Original cause of wound was Gradually Appeared. The wound is located on the Right,Medial Upper Leg. The wound measures 0cm length x 0cm width x 0cm  depth; 0cm^2 area and 0cm^3 volume. There is no tunneling or undermining noted. There is a none present amount of drainage noted. The wound margin is flat and intact. There is no granulation within the wound bed. There is no necrotic tissue within the wound bed. Wound #149 status is Healed - Epithelialized. Original cause of wound was Blister. The wound is located on the Left,Medial Upper Leg. The wound measures 0cm length x 0cm width x 0cm depth; 0cm^2 area and 0cm^3 volume. There is no tunneling or undermining noted. There is a none present amount of drainage noted. The wound margin is flat and intact. There is no granulation within the wound bed. There is no necrotic tissue within the wound bed. Wound #151 status is Open. Original cause of wound was Blister. The wound is located on the Left,Circumferential Lower Leg. The wound measures 6.5cm length x 25cm width x 0.1cm depth; 127.627cm^2 area and 12.763cm^3 volume. There is Fat Layer (Subcutaneous Tissue) Exposed exposed. There is no tunneling or undermining noted. There is a large amount of serous drainage noted. The wound margin is distinct with the outline attached to the wound base. There is large (67-100%) pink granulation within the wound bed. There is a small (1-33%) amount of necrotic tissue within the wound bed including Adherent Slough. Wound #152 status is Open. Original cause of wound was Gradually Appeared. The wound is located on the Right Toe Second. The wound measures 0.2cm length x 0.2cm width x 0.1cm depth; 0.031cm^2 area and 0.003cm^3 volume. There is Fat Layer (Subcutaneous Tissue) Exposed exposed. There is no tunneling or undermining noted. There is a small amount of serous drainage noted. The wound margin is distinct with the outline attached to the wound base. There is large (67-100%) pink granulation within the wound bed. There is no necrotic tissue within the wound bed. Wound #153 status is Open. Original cause of  wound was Blister. The wound is located on the Right,Medial Lower Leg. The wound measures 2.3cm length x 1cm width x 0.1cm depth; 1.806cm^2 area and 0.181cm^3 volume. There is Fat Layer (Subcutaneous Tissue) Exposed exposed. There is no tunneling or undermining noted. There is a medium amount of serosanguineous drainage noted. The wound margin is distinct with the outline attached to the wound base. There is large (67-100%) red, pink granulation within the wound bed. There is no necrotic tissue within the wound bed. 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 There was a Four Layer Compression Therapy Procedure by Yevonne Pax, RN. Post procedure Diagnosis Wound #151: Same as Pre-Procedure Wound #153 Pre-procedure diagnosis of Wound #153 is a Lymphedema located on the Right,Medial Lower Leg . There was a Radio broadcast assistant Compression Therapy Procedure by Yevonne Pax, RN. Post procedure Diagnosis Wound #153: Same as Pre-Procedure Plan Follow-up Appointments: Return Appointment in 1 week. Dressing Change Frequency: Do not change entire dressing for one week. - both lower legs Skin Barriers/Peri-Wound Care: Wound #151 Left,Circumferential Lower Leg: Barrier cream TCA Cream or Ointment Wound #153 Right,Medial Lower Leg: TCA Cream or Ointment - mix with lotion right leg Wound Cleansing: May shower with protection. Primary Wound Dressing: Wound #151 Left,Circumferential  Lower Leg: Calcium Alginate with Silver Wound #152 Right Toe Second: Calcium Alginate with Silver Wound #153 Right,Medial Lower Leg: Calcium Alginate with Silver Secondary Dressing: Wound #151 Left,Circumferential Lower Leg: ABD pad Other: - zetuvit Wound #152 Right Toe Second: Kerlix/Rolled Gauze Drawtex Wound #153 Right,Medial Lower Leg: ABD pad Edema Control: 4 layer compression: Left lower extremity - unna boot at upper portion of lower leg. Unna Boot to Right Lower Extremity Avoid standing for long periods of time Elevate legs to the level of the heart or above for 30 minutes daily and/or when sitting, a frequency of: - throughout the day Exercise regularly Segmental Compressive Device. - lymphadema pumps 60 min 2 times per day 1. Continue using extra sorb and Silver alginate primary dressing, continue with compression wraps to the left and Unna boot to the right 2. Patient to complete his Cipro 3. TCA to periwound 4. Return to clinic next week Electronic Signature(s) Signed: 11/03/2019 11:45:48 AM By: Cassandria Anger MD, MBA Entered By: Cassandria Anger on 11/03/2019 11:45:47 -------------------------------------------------------------------------------- SuperBill Details Patient Name: Date of Service: SHAHID, FLORI 11/03/2019 Medical Record [id number] Patient Account Number: [id number] Date of Birth/Sex: Treating RN: 11/21/1950 (68 y.o. M) Primary Care Provider: Nicoletta Ba Other Clinician: Referring Provider: Treating Provider/Extender:Jacob Chamblee, Francis Dowse, [name] in Treatment: 197 Diagnosis Coding ICD-10 Codes Code Description 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 skin L97.221 Non-pressure chronic ulcer of left calf limited to breakdown of skin 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 of bilateral lower extremity I89.0 Lymphedema, not elsewhere classified E11.622 Type  2 diabetes mellitus with other skin ulcer 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, unspecified L03.116 Cellulitis of left lower limb Physician Procedures CPT4 Code Description: [id number] - WC PHYS LEVEL 4 - EST PT ICD-10 Diagnosis Description L97.221 Non-pressure chronic ulcer of left calf limited to break Modifier: down of skin Quantity: 1 Electronic Signature(s) Signed: 11/03/2019 11:46:12 AM By: Cassandria Anger MD, MBA Entered By: Cassandria Anger on 11/03/2019 11:46:10

## 2019-11-04 DIAGNOSIS — I890 Lymphedema, not elsewhere classified: Secondary | ICD-10-CM | POA: Diagnosis not present

## 2019-11-04 DIAGNOSIS — R29898 Other symptoms and signs involving the musculoskeletal system: Secondary | ICD-10-CM | POA: Diagnosis not present

## 2019-11-04 NOTE — Progress Notes (Addendum)
KEMPER, HEUPEL ([id number]) Visit Report for 11/03/2019 Arrival Information Details Patient Name: Date of Service: [name], [name] 11/03/2019 10:45 AM Medical Record [id number] Patient Account Number: [id number] Date of Birth/Sex: Treating RN: 02/05/1951 (68 y.o. [name] Primary Care Nickola Lenig: [name] Other Clinician: Referring Mercer Peifer: Treating Armen Waring/Extender:Madduri, Kristeen Mans, [name] in Treatment: 197 Visit Information History Since Last Visit Walker Added or deleted any medications: No Patient Arrived: Any new allergies or adverse reactions: No Arrival Time: 11:10 Had a fall or experienced change in No Accompanied By: alone activities of daily living that may affect Transfer Assistance: None risk of falls: Patient Identification Verified: Yes Signs or symptoms of abuse/neglect since last No Secondary Verification Process Completed: Yes visito Patient Requires Transmission-Based No Hospitalized since last visit: No Precautions: Implantable device outside of the clinic excluding No Patient Has Alerts: Yes cellular tissue based products placed in the center since last visit: Has Dressing in Place as Prescribed: Yes Has Compression in Place as Prescribed: Yes Pain Present Now: No Electronic Signature(s) Signed: 11/04/2019 3:10:57 PM By: Levan Hurst RN, BSN Entered By: Levan Hurst on 11/03/2019 11:12:36 -------------------------------------------------------------------------------- Compression Therapy Details Patient Name: Date of Service: [name], [name] 11/03/2019 10:45 AM Medical Record [id number] Patient Account Number: [id number] Date of Birth/Sex: Treating RN: 01-Mar-1951 (68 y.o. [name] Primary Care Salomon Ganser: [name] Other Clinician: Referring Morissa Obeirne: Treating Cyera Balboni/Extender:Madduri, Kristeen Mans, [name] in Treatment: 197 Compression Therapy Performed for Wound Wound #151  Left,Circumferential Lower Leg Assessment: Performed By: Clinician [name], RN Compression Type: Four Layer Post Procedure Diagnosis Same as Pre-procedure Electronic Signature(s) Signed: 11/03/2019 5:40:33 PM By: Baruch Gouty RN, BSN Entered By: Baruch Gouty on 11/03/2019 11:43:11 -------------------------------------------------------------------------------- Compression Therapy Details Patient Name: Date of Service: [name], [name] 11/03/2019 10:45 AM Medical Record [id number] Patient Account Number: [id number] Date of Birth/Sex: Treating RN: 10/16/1951 (68 y.o. [name] Primary Care Reakwon Barren: [name] Other Clinician: Referring Juelz Claar: Treating Rehaan Viloria/Extender:Madduri, Kristeen Mans, [name] in Treatment: 197 Compression Therapy Performed for Wound Wound #153 Right,Medial Lower Leg Assessment: Performed By: Clinician [name], RN Compression Type: [name] Post Procedure Diagnosis Same as Pre-procedure Electronic Signature(s) Signed: 11/03/2019 5:40:33 PM By: Baruch Gouty RN, BSN Entered By: Baruch Gouty on 11/03/2019 11:43:34 -------------------------------------------------------------------------------- Encounter Discharge Information Details Patient Name: Date of Service: [name], [name] 11/03/2019 10:45 AM Medical Record [id number] Patient Account Number: [id number] Date of Birth/Sex: Treating RN: April 27, 1951 (68 y.o. [name]) [name] Primary Care Saher Davee: [name] Other Clinician: Referring Malynn Lucy: Treating Male Iafrate/Extender:Madduri, Kristeen Mans, [name] in Treatment: 197 Encounter Discharge Information Items Discharge Condition: Stable Ambulatory Status: Walker Discharge Destination: Home Transportation: Private Auto Accompanied By: self Schedule Follow-up Appointment: Yes Clinical Summary of Care: Patient Declined Electronic Signature(s) Signed: 11/03/2019 5:40:54 PM By: [name] RN Entered By: [name] on 11/03/2019 12:01:16 -------------------------------------------------------------------------------- Lower Extremity Assessment Details Patient Name: Date of Service: [name], [name] 11/03/2019 10:45 AM Medical Record [id number] Patient Account Number: [id number] Date of Birth/Sex: Treating RN: 1951-08-08 (68 y.o. [name] Primary Care Eliott Amparan: [name] Other Clinician: Referring Shaune Malacara: Treating Dayyan Krist/Extender:Madduri, Kristeen Mans, [name] in Treatment: 197 Edema Assessment Assessed: [Left: No] [Right: No] Edema: [Left: Yes] [Right: Yes] Calf Left: Right: Point of Measurement: 31 cm From Medial Instep 42.4 cm 40.6 cm Ankle Left: Right: Point of Measurement: 12 cm From Medial Instep 26 cm 28.5 cm Vascular Assessment Pulses: Dorsalis Pedis Palpable: [Left:Yes] [Right:Yes] Electronic Signature(s) Signed: 11/04/2019 3:10:57 PM By: Levan Hurst RN, BSN Entered  By: Levan Hurst on 11/03/2019 11:20:26 -------------------------------------------------------------------------------- [place] Details Patient Name: Date of Service: [name], [name] 11/03/2019 10:45 AM Medical Record [id number] Patient Account Number: [id number] Date of Birth/Sex: Treating RN: 31-Oct-1951 (68 y.o. [name] Primary Care Mikena Masoner: [name] Other Clinician: Referring Christyl Osentoski: Treating Selwyn Reason/Extender:Madduri, Kristeen Mans, [name] in Treatment: 197 Active Inactive Venous Leg Ulcer Nursing Diagnoses: Actual venous Insuffiency (use after diagnosis is confirmed) Goals: Patient will maintain optimal edema control Date Initiated: 09/10/2016 Target Resolution Date: 12/01/2019 Goal Status: Active Verify adequate tissue perfusion prior to therapeutic compression application Date Initiated: 09/10/2016 Date Inactivated: 11/28/2016 Goal Status: Met Interventions: Assess peripheral  edema status every visit. Compression as ordered Provide education on venous insufficiency Notes: edema not contolled above wraps, pt not using lymoh pumps regularly Wound/Skin Impairment Nursing Diagnoses: Impaired tissue integrity Goals: Patient/caregiver will verbalize understanding of skin care regimen Date Initiated: 09/10/2016 Target Resolution Date: 12/01/2019 Goal Status: Active Interventions: Assess patient/caregiver ability to perform ulcer/skin care regimen upon admission and as needed Assess ulceration(s) every visit Provide education on ulcer and skin care Notes: Electronic Signature(s) Signed: 11/03/2019 5:40:33 PM By: Baruch Gouty RN, BSN Entered By: Baruch Gouty on 11/03/2019 11:41:07 -------------------------------------------------------------------------------- Pain Assessment Details Patient Name: Date of Service: [name], [name] 11/03/2019 10:45 AM Medical Record [id number] Patient Account Number: [id number] Date of Birth/Sex: Treating RN: [birth year] (68 y.o. [name] Primary Care Jerolyn Flenniken: [name] Other Clinician: Referring Andru Genter: Treating Aishani Kalis/Extender:Madduri, Kristeen Mans, [name] in Treatment: 197 Active Problems Location of Pain Severity and Description of Pain Patient Has Paino No Site Locations Pain Management and Medication Current Pain Management: Electronic Signature(s) Signed: 11/04/2019 3:10:57 PM By: Levan Hurst RN, BSN Entered By: Levan Hurst on 11/03/2019 11:13:27 -------------------------------------------------------------------------------- Patient/Caregiver Education Details Patient Name: [name] 12/30/2020andnbsp10:45 Date of Service: AM Medical Record [id number] Number: Patient Account Number: [id number] Treating RN: Date of Birth/Gender: 1951-04-02 (68 y.o. [name]) Other Clinician: Primary Care Physician: [name] Treating Tobi Bastos Referring  Physician: Physician/Extender: Deno Etienne in Treatment: 197 Education Assessment Education Provided To: Patient Education Topics Provided Venous: Methods: Explain/Verbal Responses: Reinforcements needed, State content correctly Wound/Skin Impairment: Methods: Explain/Verbal Responses: Reinforcements needed, State content correctly Electronic Signature(s) Signed: 11/03/2019 5:40:33 PM By: Baruch Gouty RN, BSN Entered By: Baruch Gouty on 11/03/2019 11:42:06 -------------------------------------------------------------------------------- Wound Assessment Details Patient Name: Date of Service: [name], [name] 11/03/2019 10:45 AM Medical Record [id number] Patient Account Number: [id number] Date of Birth/Sex: Treating RN: 1951-04-07 (68 y.o. [name] Primary Care Koleson Reifsteck: [name] Other Clinician: Referring Elice Crigger: Treating Atsushi Yom/Extender:Madduri, Kristeen Mans, [name] in Treatment: 197 Wound Status Wound Number: [id number] Primary Lymphedema Etiology: Wound Location: Right Upper Leg - Medial Wound Healed - Epithelialized Wounding Event: Gradually Appeared Status: Date Acquired: 09/12/2019 Comorbid Chronic sinus problems/congestion, Weeks Of Treatment: 7 History: Arrhythmia, Hypertension, Peripheral Arterial Clustered Wound: No Disease, Type II Diabetes, History of Burn, Gout, Confinement Anxiety Photos Wound Measurements Length: (cm) 0 % Reduction Width: (cm) 0 % Reduction Depth: (cm) 0 Epitheliali Area: (cm) 0 Tunneling: Volume: (cm) 0 Underminin Wound Description Classification: Partial Thickness Foul Odor Wound Margin: Flat and Intact Slough/Fib Exudate Amount: None Present Wound Bed Granulation Amount: None Present (0%) Necrotic Amount: None Present (0%) Fascia Fat La Tendon Muscle Joint Bone E After Cleansing: No rino No Exposed Structure Exposed: No yer (Subcutaneous Tissue) Exposed: No Exposed:  No Exposed: No Exposed: No xposed: No in Area: 100% in Volume: 100% zation: Large (67-100%) No g: No Electronic Signature(s)  Signed: 11/04/2019 12:26:55 PM By: Mikeal Hawthorne EMT/HBOT Signed: 11/04/2019 3:10:57 PM By: Levan Hurst RN, BSN Entered By: Mikeal Hawthorne on 11/04/2019 11:48:21 -------------------------------------------------------------------------------- Wound Assessment Details Patient Name: Date of Service: [name], [name] 11/03/2019 10:45 AM Medical Record [id number] Patient Account Number: [id number] Date of Birth/Sex: Treating RN: 1951/07/21 (68 y.o. [name] Primary Care Anwita Mencer: [name] Other Clinician: Referring Onedia Vargus: Treating Pleas Carneal/Extender:Madduri, Kristeen Mans, [name] in Treatment: 197 Wound Status Wound Number: [id number] Primary Lymphedema Etiology: Wound Location: Left Upper Leg - Medial Wound Healed - Epithelialized Wounding Event: Blister Status: Date Acquired: 10/04/2019 Comorbid Chronic sinus problems/congestion, Weeks Of Treatment: 4 History: Arrhythmia, Hypertension, Peripheral Arterial Clustered Wound: No Disease, Type II Diabetes, History of Burn, Gout, Confinement Anxiety Photos Wound Measurements Length: (cm) 0 % Reduc Width: (cm) 0 % Reduc Depth: (cm) 0 Epithel Area: (cm) 0 Tunnel Volume: (cm) 0 Underm Wound Description Full Thickness Without Exposed Support Classification: Structures Wound Wound Flat and Intact Margin: Exudate None Present Amount: Wound Bed Granulation Amount: None Present (0%) Necrotic Amount: None Present (0%) Foul Odor After Cleansing: No Slough/Fibrino No Exposed Structure Fascia Exposed: No Fat Layer (Subcutaneous Tissue) Exposed: No Tendon Exposed: No Muscle Exposed: No Joint Exposed: No Bone Exposed: No tion in Area: 100% tion in Volume: 100% ialization: Large (67-100%) ing: No ining: No Electronic Signature(s) Signed: 11/04/2019 12:26:55 PM By:  Mikeal Hawthorne EMT/HBOT Signed: 11/04/2019 3:10:57 PM By: Levan Hurst RN, BSN Entered By: Mikeal Hawthorne on 11/04/2019 12:13:17 -------------------------------------------------------------------------------- Wound Assessment Details Patient Name: Date of Service: [name], [name] 11/03/2019 10:45 AM Medical Record [id number] Patient Account Number: [id number] Date of Birth/Sex: Treating RN: 05-14-1951 (68 y.o. [name] Primary Care Liandro Thelin: [name] Other Clinician: Referring Trishelle Devora: Treating Asyah Candler/Extender:Madduri, Kristeen Mans, [name] in Treatment: 197 Wound Status Wound Number: [id number] Primary Lymphedema Etiology: Wound Location: Left Lower Leg - Circumferential Wound Open Wounding Event: Blister Status: Date Acquired: 10/16/2019 Comorbid Chronic sinus problems/congestion, Weeks Of Treatment: 2 History: Arrhythmia, Hypertension, Peripheral Arterial Clustered Wound: Yes Disease, Type II Diabetes, History of Burn, Gout, Confinement Anxiety Photos Wound Measurements Length: (cm) 6.5 % Reduc Width: (cm) 25 % Reduc Depth: (cm) 0.1 Epitheli Clustered Quantity: 10 Tunnelin Area: (cm) 127.627 Undermi Volume: (cm) 12.763 Wound Description Classification: Full Thickness Without Exposed Support Foul Odo Structures Slough/F Wound Distinct, outline attached Margin: Exudate Large Amount: Exudate Serous Type: Exudate amber Color: Wound Bed Granulation Amount: Large (67-100%) Granulation Quality: Pink Fascia E Necrotic Amount: Small (1-33%) Fat Laye Necrotic Quality: Adherent Slough Tendon E Muscle E Joint Ex Bone Exp r After Cleansing: No ibrino Yes Exposed Structure xposed: No r (Subcutaneous Tissue) Exposed: Yes xposed: No xposed: No posed: No osed: No tion in Area: -792.9% tion in Volume: -793.1% alization: Small (1-33%) g: No ning: No Treatment Notes Wound #151 (Left, Circumferential Lower Leg) 1. Cleanse  With Wound Cleanser Soap and water 2. Periwound Care Barrier cream TCA Cream 3. Primary Dressing Applied Calcium Alginate Ag 4. Secondary Dressing ABD Pad Dry Gauze 6. Support Layer Applied 4 layer compression wrap Notes stockinette. Electronic Signature(s) Signed: 11/08/2019 4:12:00 PM By: Mikeal Hawthorne EMT/HBOT Signed: 11/08/2019 5:01:35 PM By: Levan Hurst RN, BSN Previous Signature: 11/04/2019 3:10:57 PM Version By: Levan Hurst RN, BSN Entered By: Mikeal Hawthorne on 11/08/2019 09:37:05 -------------------------------------------------------------------------------- Wound Assessment Details Patient Name: Date of Service: [name], [name] 11/03/2019 10:45 AM Medical Record [id number] Patient Account Number: [id number] Date of Birth/Sex: Treating RN: 07-Oct-1951 (68 y.o. [name] Primary Care Shawnell Dykes: [name]  Other Clinician: Referring Kandice Schmelter: Treating Ansley Mangiapane/Extender:Madduri, Kristeen Mans, [name] in Treatment: 197 Wound Status Wound Number: [id number] Primary Lymphedema Etiology: Wound Location: Right Toe Second Wound Open Wounding Event: Gradually Appeared Status: Date Acquired: 10/16/2019 Comorbid Chronic sinus problems/congestion, Weeks Of Treatment: 2 History: Arrhythmia, Hypertension, Peripheral Arterial Clustered Wound: Yes Disease, Type II Diabetes, History of Burn, Gout, Confinement Anxiety Photos Wound Measurements Length: (cm) 0.2 % Reduc Width: (cm) 0.2 % Reduc Depth: (cm) 0.1 Epithel Clustered Quantity: 3 Tunneli Area: (cm) 0.031 Underm Volume: (cm) 0.003 Wound Description Full Thickness Without Exposed Support Classification: Structures Wound Distinct, outline attached Margin: Exudate Small Amount: Exudate Serous Type: Exudate amber Color: Wound Bed Granulation Amount: Large (67-100%) Granulation Quality: Pink Necrotic Amount: None Present (0%) Foul Odor After Cleansing: No Slough/Fibrino No Exposed  Structure Fascia Exposed: No Fat Layer (Subcutaneous Tissue) Exposed: Yes Tendon Exposed: No Muscle Exposed: No Joint Exposed: No Bone Exposed: No tion in Area: 99.1% tion in Volume: 99.2% ialization: Large (67-100%) ng: No ining: No Treatment Notes Wound #152 (Right Toe Second) 1. Cleanse With Wound Cleanser Soap and water 2. Periwound Care Barrier cream TCA Cream 3. Primary Dressing Applied Calcium Alginate Ag Dry Gauze 5. Secured With Recruitment consultant) Signed: 11/04/2019 12:26:55 PM By: Mikeal Hawthorne EMT/HBOT Signed: 11/04/2019 3:10:57 PM By: Levan Hurst RN, BSN Entered By: Mikeal Hawthorne on 11/04/2019 12:12:19 -------------------------------------------------------------------------------- Wound Assessment Details Patient Name: Date of Service: [name], [name] 11/03/2019 10:45 AM Medical Record [id number] Patient Account Number: [id number] Date of Birth/Sex: Treating RN: 1950-11-29 (68 y.o. [name] Primary Care Letecia Arps: [name] Other Clinician: Referring Renny Gunnarson: Treating Jahsir Rama/Extender:Madduri, Kristeen Mans, [name] in Treatment: 197 Wound Status Wound Number: [id number] Primary Lymphedema Etiology: Wound Location: Right Lower Leg - Medial Wound Open Wounding Event: Blister Status: Date Acquired: 10/27/2019 Comorbid Chronic sinus problems/congestion, Weeks Of Treatment: 1 History: Arrhythmia, Hypertension, Peripheral Arterial Clustered Wound: No Disease, Type II Diabetes, History of Burn, Gout, Confinement Anxiety Photos Wound Measurements Length: (cm) 2.3 % Reduction Width: (cm) 1 % Reduction Depth: (cm) 0.1 Epithelializ Area: (cm) 1.806 Tunneling: Volume: (cm) 0.181 Underm Wound Description Classification: Full Thickness Without Exposed [place] Wound Distinct, outline attached Margin: Exudate Medium Amount: Exudate Serosanguineous Type: Exudate red,  brown Color: Wound Bed Granulation Amount: Large (67-100%) Granulation Quality: Red, Pink Fascia Necrotic Amount: None Present (0%) Fat La Tendon Muscle Joint Bone E dor After Cleansing: No /Fibrino No Exposed Structure Exposed: No yer (Subcutaneous Tissue) Exposed: Yes Exposed: No Exposed: No Exposed: No xposed: No in Area: 91.6% in Volume: 91.6% ation: None No ining: No Treatment Notes Wound #153 (Right, Medial Lower Leg) 1. Cleanse With Wound Cleanser Soap and water 2. Periwound Care Barrier cream TCA Cream 3. Primary Dressing Applied Calcium Alginate Ag 4. Secondary Dressing ABD Pad 6. Support Layer Forensic psychologist. Electronic Signature(s) Signed: 11/04/2019 12:26:55 PM By: Mikeal Hawthorne EMT/HBOT Signed: 11/04/2019 3:10:57 PM By: Levan Hurst RN, BSN Entered By: Mikeal Hawthorne on 11/04/2019 11:48:53 -------------------------------------------------------------------------------- Vitals Details Patient Name: Date of Service: [name], [name] 11/03/2019 10:45 AM Medical Record [id number] Patient Account Number: [id number] Date of Birth/Sex: Treating RN: Mar 25, 1951 (68 y.o. [name] Primary Care Valjean Ruppel: [name] Other Clinician: Referring Dave Mannes: Treating Lamoyne Hessel/Extender:Madduri, Kristeen Mans, [name] in Treatment: 197 Vital Signs Time Taken: 11:12 Temperature (F): 97.9 Height (in): 70 Pulse (bpm): 49 Weight (lbs): 380.2 Respiratory Rate (breaths/min): 18 Body Mass Index (BMI): 54.5 Blood Pressure (mmHg): 158/60 Capillary Blood Glucose (mg/dl): 189 Reference  Range: 80 - 120 mg / dl Notes glucose per pt report Electronic Signature(s) Signed: 11/04/2019 3:10:57 PM By: Levan Hurst RN, BSN Entered By: Levan Hurst on 11/03/2019 11:13:20

## 2019-11-10 ENCOUNTER — Ambulatory Visit (INDEPENDENT_AMBULATORY_CARE_PROVIDER_SITE_OTHER): Payer: Medicare Other | Admitting: Family Medicine

## 2019-11-10 ENCOUNTER — Telehealth: Payer: Self-pay

## 2019-11-10 ENCOUNTER — Other Ambulatory Visit: Payer: Self-pay

## 2019-11-10 ENCOUNTER — Encounter (HOSPITAL_BASED_OUTPATIENT_CLINIC_OR_DEPARTMENT_OTHER): Payer: Medicare Other | Admitting: Physician Assistant

## 2019-11-10 DIAGNOSIS — Z9119 Patient's noncompliance with other medical treatment and regimen: Secondary | ICD-10-CM | POA: Insufficient documentation

## 2019-11-10 DIAGNOSIS — F419 Anxiety disorder, unspecified: Secondary | ICD-10-CM | POA: Diagnosis not present

## 2019-11-10 DIAGNOSIS — E11622 Type 2 diabetes mellitus with other skin ulcer: Secondary | ICD-10-CM | POA: Diagnosis not present

## 2019-11-10 DIAGNOSIS — I872 Venous insufficiency (chronic) (peripheral): Secondary | ICD-10-CM | POA: Insufficient documentation

## 2019-11-10 DIAGNOSIS — L03116 Cellulitis of left lower limb: Secondary | ICD-10-CM | POA: Diagnosis not present

## 2019-11-10 DIAGNOSIS — I10 Essential (primary) hypertension: Secondary | ICD-10-CM | POA: Diagnosis not present

## 2019-11-10 DIAGNOSIS — L97219 Non-pressure chronic ulcer of right calf with unspecified severity: Secondary | ICD-10-CM | POA: Diagnosis not present

## 2019-11-10 DIAGNOSIS — I482 Chronic atrial fibrillation, unspecified: Secondary | ICD-10-CM | POA: Insufficient documentation

## 2019-11-10 DIAGNOSIS — L97519 Non-pressure chronic ulcer of other part of right foot with unspecified severity: Secondary | ICD-10-CM | POA: Diagnosis not present

## 2019-11-10 DIAGNOSIS — L97221 Non-pressure chronic ulcer of left calf limited to breakdown of skin: Secondary | ICD-10-CM | POA: Diagnosis not present

## 2019-11-10 DIAGNOSIS — E1140 Type 2 diabetes mellitus with diabetic neuropathy, unspecified: Secondary | ICD-10-CM | POA: Diagnosis not present

## 2019-11-10 DIAGNOSIS — I890 Lymphedema, not elsewhere classified: Secondary | ICD-10-CM | POA: Diagnosis not present

## 2019-11-10 DIAGNOSIS — Z881 Allergy status to other antibiotic agents status: Secondary | ICD-10-CM | POA: Diagnosis not present

## 2019-11-10 DIAGNOSIS — L03119 Cellulitis of unspecified part of limb: Secondary | ICD-10-CM | POA: Diagnosis not present

## 2019-11-10 DIAGNOSIS — L97222 Non-pressure chronic ulcer of left calf with fat layer exposed: Secondary | ICD-10-CM | POA: Diagnosis not present

## 2019-11-10 DIAGNOSIS — I87333 Chronic venous hypertension (idiopathic) with ulcer and inflammation of bilateral lower extremity: Secondary | ICD-10-CM | POA: Insufficient documentation

## 2019-11-10 DIAGNOSIS — L97119 Non-pressure chronic ulcer of right thigh with unspecified severity: Secondary | ICD-10-CM | POA: Diagnosis not present

## 2019-11-10 NOTE — Progress Notes (Addendum)
[name] a 68 y.o.malepresents to the office today for monthly Bicillininjections, per physician's orders. Per PCP order, Bicillin 600,000 units/ml (x2)was administeredin each upper outer quadranttoday. Patient tolerated injection.  Patient next injection due:12/11/19, appt madeNo  Agree with monthly Bicillin 1.2 million units q month for recurrent LE cellulitis-->regimen recommended by pt's ID MD. Signed:  Santiago Bumpers, MD           11/26/2019

## 2019-11-10 NOTE — Progress Notes (Addendum)
HAKIEM, MALIZIA ([id number]) Visit Report for 11/10/2019 Arrival Information Details Patient Name: Date of Service: [name], [name] 11/10/2019 10:00 AM Medical Record [id number] Patient Account Number: [id number] Date of Birth/Sex: Treating RN: [birth year] (69 y.o. [name] Primary Care Janari Yamada: [name] Other Clinician: Referring Koby Pickup: Treating Bhargav Barbaro/Extender:[name], Dema Severin, [name] in Treatment: 37 Visit Information History Since Last Visit Walker Added or deleted any medications: No Patient Arrived: Any new allergies or adverse reactions: No Arrival Time: 10:47 Had a fall or experienced change in No Accompanied By: self activities of daily living that may affect Transfer Assistance: None risk of falls: Patient Identification Verified: Yes Signs or symptoms of abuse/neglect since last No Secondary Verification Process Completed: Yes visito Patient Requires Transmission-Based No Hospitalized since last visit: No Precautions: Implantable device outside of the clinic excluding No Patient Has Alerts: Yes cellular tissue based products placed in the center since last visit: Has Dressing in Place as Prescribed: Yes Has Compression in Place as Prescribed: Yes Pain Present Now: No Electronic Signature(s) Signed: 11/10/2019 5:41:11 PM By: Deon Pilling Entered By: Deon Pilling on 11/10/2019 10:48:33 -------------------------------------------------------------------------------- Compression Therapy Details Patient Name: Date of Service: [name], [name] 11/10/2019 10:00 AM Medical Record [id number] Patient Account Number: [id number] Date of Birth/Sex: Treating RN: 1951/09/23 (69 y.o. [name] Primary Care Jalena Vanderlinden: [name] Other Clinician: Referring Anniebelle Devore: Treating Daana Petrasek/Extender:[name], Dema Severin, [name] in Treatment: 198 Compression Therapy Performed for Wound Wound #155 Right,Distal,Medial  Lower Leg Assessment: Performed By: Jake Church, RN Compression Type: [name] Post Procedure Diagnosis Same as Pre-procedure Electronic Signature(s) Signed: 11/10/2019 6:06:10 PM By: Baruch Gouty RN, BSN Entered By: Baruch Gouty on 11/10/2019 11:27:09 -------------------------------------------------------------------------------- Compression Therapy Details Patient Name: Date of Service: [name] 11/10/2019 10:00 AM Medical Record [id number] Patient Account Number: [id number] Date of Birth/Sex: Treating RN: 1951/02/05 (69 y.o. [name] Primary Care Kratos Ruscitti: [name] Other Clinician: Referring Samnang Shugars: Treating Pharrell Ledford/Extender:[name], Dema Severin, [name] in Treatment: 198 Compression Therapy Performed for Wound Wound #154 Left,Medial Lower Leg Assessment: Performed By: Clinician [name], RN Compression Type: Four Layer Post Procedure Diagnosis Same as Pre-procedure Electronic Signature(s) Signed: 11/10/2019 6:06:10 PM By: Baruch Gouty RN, BSN Entered By: Baruch Gouty on 11/10/2019 11:27:35 -------------------------------------------------------------------------------- Encounter Discharge Information Details Patient Name: Date of Service: [name] 11/10/2019 10:00 AM Medical Record [id number] Patient Account Number: [id number] Date of Birth/Sex: Treating RN: 1951/09/21 (69 y.o. [name] Primary Care Mahek Schlesinger: [name] Other Clinician: Referring Lisandra Mathisen: Treating Thelbert Gartin/Extender:[name], Dema Severin, [name] in Treatment: 198 Encounter Discharge Information Items Discharge Condition: Stable Ambulatory Status: Walker Discharge Destination: Home Transportation: Private Auto Accompanied By: self Schedule Follow-up Appointment: Yes Clinical Summary of Care: Electronic Signature(s) Signed: 11/10/2019 5:41:11 PM By: Deon Pilling Entered By: Deon Pilling on 11/10/2019  11:59:41 -------------------------------------------------------------------------------- Lower Extremity Assessment Details Patient Name: Date of Service: [name], [name] 11/10/2019 10:00 AM Medical Record [id number] Patient Account Number: [id number] Date of Birth/Sex: Treating RN: 1951/09/03 (69 y.o. [name] Primary Care Ameyah Bangura: [name] Other Clinician: Referring Annaliesa Blann: Treating Dhruti Ghuman/Extender:[name], Dema Severin, [name] in Treatment: 198 Edema Assessment Assessed: [Left: Yes] [Right: Yes] Edema: [Left: Yes] [Right: Yes] Calf Left: Right: Point of Measurement: 31 cm From Medial Instep 43 cm 39 cm Ankle Left: Right: Point of Measurement: 12 cm From Medial Instep 26 cm 28 cm Electronic Signature(s) Signed: 11/10/2019 5:41:11 PM By: Deon Pilling Entered By: Deon Pilling on 11/10/2019 10:52:49 -------------------------------------------------------------------------------- [place]  Details Patient Name: Date of Service: [name], [name] 11/10/2019 10:00 AM Medical Record [id number] Patient Account Number: [id number] Date of Birth/Sex: Treating RN: [birth year] (69 y.o. [name] Primary Care Provider: [name] Other Clinician: Referring Provider: Treating Provider/Extender:[name], Dema Severin, [name] in Treatment: 198 Active Inactive Venous Leg Ulcer Nursing Diagnoses: Actual venous Insuffiency (use after diagnosis is confirmed) Goals: Patient will maintain optimal edema control Date Initiated: 09/10/2016 Target Resolution Date: 12/01/2019 Goal Status: Active Verify adequate tissue perfusion prior to therapeutic compression application Date Initiated: 09/10/2016 Date Inactivated: 11/28/2016 Goal Status: Met Interventions: Assess peripheral edema status every visit. Compression as ordered Provide education on venous insufficiency Notes: edema not contolled above wraps, pt not using  lymoh pumps regularly Wound/Skin Impairment Nursing Diagnoses: Impaired tissue integrity Goals: Patient/caregiver will verbalize understanding of skin care regimen Date Initiated: 09/10/2016 Target Resolution Date: 12/01/2019 Goal Status: Active Interventions: Assess patient/caregiver ability to perform ulcer/skin care regimen upon admission and as needed Assess ulceration(s) every visit Provide education on ulcer and skin care Notes: Electronic Signature(s) Signed: 11/10/2019 6:06:10 PM By: Baruch Gouty RN, BSN Entered By: Baruch Gouty on 11/10/2019 11:20:19 -------------------------------------------------------------------------------- Pain Assessment Details Patient Name: Date of Service: [name] 11/10/2019 10:00 AM Medical Record [id number] Patient Account Number: [id number] Date of Birth/Sex: Treating RN: 1951-09-16 (69 y.o. [name] Primary Care Provider: [name] Other Clinician: Referring Provider: Treating Provider/Extender:[name], Dema Severin, [name] in Treatment: 198 Active Problems Location of Pain Severity and Description of Pain Patient Has Paino No Site Locations Rate the pain. Current Pain Level: 0 Pain Management and Medication Current Pain Management: Medication: No Cold Application: No Rest: No Massage: No Activity: No T.E.N.S.: No Heat Application: No Leg drop or elevation: No Is the Current Pain Management Adequate: Adequate How does your wound impact your activities of daily livingo Sleep: No Bathing: No Appetite: No Relationship With Others: No Bladder Continence: No Emotions: No Bowel Continence: No Work: No Toileting: No Drive: No Dressing: No Hobbies: No Electronic Signature(s) Signed: 11/10/2019 5:41:11 PM By: Deon Pilling Entered By: Deon Pilling on 11/10/2019 10:48:46 -------------------------------------------------------------------------------- Patient/Caregiver Education  Details Patient Name: Date of Service: [name], [name] 1/6/2021andnbsp10:00 AM Medical Record [id number] Patient Account Number: [id number] Date of Birth/Gender: Treating RN: May 31, 1951 (69 y.o. [name] Primary Care Physician: [name] Other Clinician: Referring Physician: Treating Physician/Extender:[name], Dema Severin, [name] in Treatment: 198 Education Assessment Education Provided To: Patient Education Topics Provided Venous: Methods: Explain/Verbal Responses: Reinforcements needed, State content correctly Wound/Skin Impairment: Methods: Explain/Verbal Responses: Reinforcements needed, State content correctly Electronic Signature(s) Signed: 11/10/2019 6:06:10 PM By: Baruch Gouty RN, BSN Entered By: Baruch Gouty on 11/10/2019 11:20:45 -------------------------------------------------------------------------------- Wound Assessment Details Patient Name: Date of Service: [name] 11/10/2019 10:00 AM Medical Record [id number] Patient Account Number: [id number] Date of Birth/Sex: Treating RN: 03-17-1951 (69 y.o. [name] Primary Care Provider: [name] Other Clinician: Referring Provider: Treating Provider/Extender:[name], Dema Severin, [name] in Treatment: 198 Wound Status Wound Number: [id number] Primary Lymphedema Etiology: Wound Location: Right Upper Leg - Medial Wound Open Wounding Event: Gradually Appeared Status: Date Acquired: 09/12/2019 Comorbid Chronic sinus problems/congestion, Weeks Of Treatment: 8 History: Arrhythmia, Hypertension, Peripheral Arterial Clustered Wound: No Disease, Type II Diabetes, History of Burn, Gout, Confinement Anxiety Photos Wound Measurements Length: (cm) 1 Width: (cm) 1.4 Depth: (cm) 0.1 Area: (cm) 1.1 Volume: (cm) 0.11 Wound Description Classification: Partial Thickness Wound Margin: Flat and Intact Exudate Amount: Medium Exudate Type:  Serosanguineous Exudate Color: red,  brown Wound Bed Granulation Amount: Large (67-100%) Granulation Quality: Red, Pink Necrotic Amount: None Present (0%) After Cleansing: No brino Yes Exposed Structure posed: No (Subcutaneous Tissue) Exposed: No posed: No posed: No osed: No sed: No % Reduction in Area: 6.6% % Reduction in Volume: 6.8% Epithelialization: Large (67-100%) Tunneling: No Undermining: No Foul Odor Slough/Fi Fascia Ex Fat Layer Tendon Ex Muscle Ex Joint Exp Bone Expo Treatment Notes Wound #[id number] (Right, Medial Upper Leg) 1. Cleanse With Wound Cleanser 3. Primary Dressing Applied Calcium Alginate Ag 4. Secondary Dressing ABD Pad 5. Secured With Recruitment consultant) Signed: 11/11/2019 4:21:35 PM By: Mikeal Hawthorne EMT/HBOT Signed: 11/11/2019 5:19:45 PM By: Deon Pilling Previous Signature: 11/10/2019 5:41:11 PM Version By: Deon Pilling Entered By: Mikeal Hawthorne on 11/11/2019 10:03:51 -------------------------------------------------------------------------------- Wound Assessment Details Patient Name: Date of Service: DAMONE, FANCHER 11/10/2019 10:00 AM Medical Record [id number] Patient Account Number: [id number] Date of Birth/Sex: Treating RN: 11-Jun-1951 (69 y.o. [name] Primary Care Provider: [name] Other Clinician: Referring Provider: Treating Provider/Extender:[name], Dema Severin, [name] in Treatment: 198 Wound Status Wound Number: [id number] Primary Lymphedema Etiology: Wound Location: Left Lower Leg - Lateral Wound Open Wounding Event: Blister Status: Date Acquired: 10/16/2019 Comorbid Chronic sinus problems/congestion, Weeks Of Treatment: 3 History: Arrhythmia, Hypertension, Peripheral Arterial Clustered Wound: Yes Disease, Type II Diabetes, History of Burn, Gout, Confinement Anxiety Photos Wound Measurements Length: (cm) 0.6 % Reduct Width: (cm) 1 % Reduct Depth: (cm) 0.1 Epitheli Clustered Quantity:  1 Tunnelin Area: (cm) 0.471 Undermi Volume: (cm) 0.047 Wound Description Full Thickness Without Exposed Support Foul Odo Classification: Structures Slough/F Wound Distinct, outline attached Margin: Exudate Medium Amount: Exudate Serous Type: Exudate amber Color: Wound Bed Granulation Amount: Large (67-100%) Granulation Quality: Pink Fascia E Necrotic Amount: None Present (0%) Fat Laye Tendon E Muscle E Joint Ex Bone Exp r After Cleansing: No ibrino No Exposed Structure xposed: No r (Subcutaneous Tissue) Exposed: Yes xposed: No xposed: No posed: No osed: No ion in Area: 96.7% ion in Volume: 96.7% alization: Large (67-100%) g: No ning: No Treatment Notes Wound #151 (Left, Lateral Lower Leg) 1. Cleanse With Wound Cleanser Soap and water 2. Periwound Care TCA Ointment 3. Primary Dressing Applied Calcium Alginate Ag 4. Secondary Dressing ABD Pad 6. Support Layer Applied 4 layer compression wrap Notes unna boot upper portion of lower leg. Electronic Signature(s) Signed: 11/11/2019 4:21:35 PM By: Mikeal Hawthorne EMT/HBOT Signed: 11/11/2019 5:19:45 PM By: Deon Pilling Previous Signature: 11/10/2019 5:41:11 PM Version By: Deon Pilling Entered By: Mikeal Hawthorne on 11/11/2019 10:03:05 -------------------------------------------------------------------------------- Wound Assessment Details Patient Name: Date of Service: [name], [name] 11/10/2019 10:00 AM Medical Record [id number] Patient Account Number: [id number] Date of Birth/Sex: Treating RN: [birth year] (69 y.o. [name] Primary Care Provider: [name] Other Clinician: Referring Provider: Treating Provider/Extender:[name], Dema Severin, [name] in Treatment: 198 Wound Status Wound Number: [id number] Primary Lymphedema Etiology: Wound Location: Right Toe Second Wound Healed - Epithelialized Wounding Event: Gradually Appeared Status: Date Acquired: 10/16/2019 Comorbid Chronic  sinus problems/congestion, Weeks Of Treatment: 3 History: Arrhythmia, Hypertension, Peripheral Arterial Clustered Wound: Yes Disease, Type II Diabetes, History of Burn, Gout, Confinement Anxiety Photos Wound Measurements Length: (cm) 0 % Reduct Width: (cm) 0 % Reduct Depth: (cm) 0 Epitheli Clustered Quantity: 3 Area: (cm) 0 Volume: (cm) 0 Wound Description Classification: Full Thickness Without Exposed Support Foul Od Structures Slough/ Wound Distinct, outline attached Margin: Exudate Small Amount: Exudate Serous Type: Exudate amber Color: Wound Bed Granulation Amount: Large (67-100%) Granulation Quality: Pink Fascia  Ex Necrotic Amount: None Present (0%) Fat Layer Tendon Ex Muscle Ex Joint Exp Bone Expo or After Cleansing: No Fibrino No Exposed Structure posed: No (Subcutaneous Tissue) Exposed: Yes posed: No posed: No osed: No sed: No ion in Area: 100% ion in Volume: 100% alization: Large (67-100%) Electronic Signature(s) Signed: 11/11/2019 4:21:35 PM By: Mikeal Hawthorne EMT/HBOT Signed: 11/11/2019 5:19:45 PM By: Deon Pilling Previous Signature: 11/10/2019 5:41:11 PM Version By: Deon Pilling Entered By: Mikeal Hawthorne on 11/11/2019 09:59:48 -------------------------------------------------------------------------------- Wound Assessment Details Patient Name: Date of Service: [name] 11/10/2019 10:00 AM Medical Record [id number] Patient Account Number: [id number] Date of Birth/Sex: Treating RN: 1951/01/14 (69 y.o. [name] Primary Care Henslee Lottman: [name] Other Clinician: Referring Nataline Basara: Treating Gaelan Glennon/Extender:[name], Dema Severin, [name] in Treatment: 198 Wound Status Wound Number: [id number] Primary Lymphedema Etiology: Wound Location: Right Lower Leg - Medial Wound Healed - Epithelialized Wounding Event: Blister Status: Date Acquired: 10/27/2019 Comorbid Chronic sinus problems/congestion, Weeks Of Treatment:  2 History: Arrhythmia, Hypertension, Peripheral Arterial Clustered Wound: No Disease, Type II Diabetes, History of Burn, Gout, Confinement Anxiety Photos Wound Measurements Length: (cm) 0 % Reductio Width: (cm) 0 % Reductio Depth: (cm) 0 Epithelial Area: (cm) 0 Volume: (cm) 0 Wound Description Classification: Full Thickness Without Exposed Support Structures Wound Distinct, outline attached Margin: Exudate Medium Amount: Exudate Serosanguineous Type: Exudate red, brown Color: Wound Bed Granulation Amount: Large (67-100%) Granulation Quality: Red, Pink Necrotic Amount: None Present (0%) Foul Odor After Cleansing: No Slough/Fibrino No Exposed Structure Fascia Exposed: No Fat Layer (Subcutaneous Tissue) Exposed: Yes Tendon Exposed: No Muscle Exposed: No Joint Exposed: No Bone Exposed: No n in Area: 100% n in Volume: 100% ization: None Electronic Signature(s) Signed: 11/11/2019 4:21:35 PM By: Mikeal Hawthorne EMT/HBOT Signed: 11/11/2019 5:19:45 PM By: Deon Pilling Previous Signature: 11/10/2019 5:41:11 PM Version By: Deon Pilling Entered By: Mikeal Hawthorne on 11/11/2019 10:02:17 -------------------------------------------------------------------------------- Wound Assessment Details Patient Name: Date of Service: [name] 11/10/2019 10:00 AM Medical Record [id number] Patient Account Number: [id number] Date of Birth/Sex: Treating RN: 03-Feb-1951 (69 y.o. [name] Primary Care Dorisann Schwanke: [name] Other Clinician: Referring Kwamaine Cuppett: Treating Yanelly Cantrelle/Extender:[name], Dema Severin, [name] in Treatment: 198 Wound Status Wound Number: 154 Primary Lymphedema Etiology: Wound Location: Left Lower Leg - Medial Wound Open Wounding Event: Shear/Friction Status: Date Acquired: 11/10/2019 Comorbid Chronic sinus problems/congestion, Weeks Of Treatment: 0 History: Arrhythmia, Hypertension, Peripheral Arterial Clustered Wound: No Disease,  Type II Diabetes, History of Burn, Gout, Confinement Anxiety Photos Wound Measurements Length: (cm) 0.4 % Reduct Width: (cm) 0.5 % Reduct Depth: (cm) 0.1 Epitheli Area: (cm) 0.157 Tunneli Volume: (cm) 0.016 Undermi Wound Description Classification: Full Thickness Without Exposed Support Foul Od Structures Slough/ Wound Distinct, outline attached Margin: Exudate Medium Amount: Exudate Serosanguineous Type: Exudate red, brown Color: Wound Bed Granulation Amount: Large (67-100%) Granulation Quality: Red, Pink Fascia E Necrotic Amount: None Present (0%) Fat Laye Tendon E Muscle E Joint Ex Bone Exp or After Cleansing: No Fibrino No Exposed Structure xposed: No r (Subcutaneous Tissue) Exposed: No xposed: No xposed: No posed: No osed: No ion in Area: 0% ion in Volume: 0% alization: Small (1-33%) ng: No ning: No Treatment Notes Wound #154 (Left, Medial Lower Leg) 1. Cleanse With Wound Cleanser Soap and water 2. Periwound Care TCA Ointment 3. Primary Dressing Applied Calcium Alginate Ag 4. Secondary Dressing ABD Pad 6. Support Layer Applied 4 layer compression wrap Notes unna boot upper portion of lower leg. Electronic Signature(s) Signed: 11/11/2019 4:21:35 PM By: Mikeal Hawthorne  EMT/HBOT Signed: 11/11/2019 5:19:45 PM By: Deon Pilling Previous Signature: 11/10/2019 5:41:11 PM Version By: Deon Pilling Previous Signature: 11/10/2019 5:41:11 PM Version By: Deon Pilling Entered By: Mikeal Hawthorne on 11/11/2019 09:56:54 -------------------------------------------------------------------------------- Wound Assessment Details Patient Name: Date of Service: [name], [name] 11/10/2019 10:00 AM Medical Record [id number] Patient Account Number: [id number] Date of Birth/Sex: Treating RN: [birth year] (69 y.o. [name] Primary Care Provider: [name] Other Clinician: Referring Provider: Treating Provider/Extender:[name], Dema Severin, [name]  in Treatment: 198 Wound Status Wound Number: [id number] Primary Lymphedema Etiology: Wound Location: Right Lower Leg - Medial, Distal Wound Open Wounding Event: Blister Status: Date Acquired: 11/10/2019 Comorbid Chronic sinus problems/congestion, Weeks Of Treatment: 0 History: Arrhythmia, Hypertension, Peripheral Arterial Clustered Wound: No Disease, Type II Diabetes, History of Burn, Gout, Confinement Anxiety Photos Wound Measurements Length: (cm) 2.3 % Reduct Width: (cm) 1.8 % Reduct Depth: (cm) 0.1 Epitheli Area: (cm) 3.252 Tunneli Volume: (cm) 0.325 Undermi Wound Description Full Thickness Without Exposed Support Foul Od Classification: Structures Slough/ Wound Distinct, outline attached Margin: Exudate Medium Amount: Exudate Serosanguineous Type: Exudate red, brown Color: Wound Bed Granulation Amount: Large (67-100%) Granulation Quality: Red, Pink Fascia Ex Necrotic Amount: None Present (0%) Fat Layer Tendon Ex Muscle Ex Joint Exp Bone Expo or After Cleansing: No Fibrino No Exposed Structure posed: No (Subcutaneous Tissue) Exposed: No posed: No posed: No osed: No sed: No ion in Area: 0% ion in Volume: 0% alization: None ng: No ning: No Treatment Notes Wound #155 (Right, Distal, Medial Lower Leg) 1. Cleanse With Wound Cleanser Soap and water 2. Periwound Care TCA Ointment 3. Primary Dressing Applied Calcium Alginate Ag 4. Secondary Dressing Dry Gauze 6. Support Layer Insurance account manager) Signed: 11/11/2019 4:21:35 PM By: Mikeal Hawthorne EMT/HBOT Signed: 11/11/2019 5:19:45 PM By: Deon Pilling Previous Signature: 11/10/2019 5:41:11 PM Version By: Deon Pilling Entered By: Mikeal Hawthorne on 11/11/2019 09:57:47 -------------------------------------------------------------------------------- Vitals Details Patient Name: Date of Service: [name]. 11/10/2019 10:00 AM Medical Record [id number] Patient  Account Number: [id number] Date of Birth/Sex: Treating RN: April 22, 1951 (69 y.o. [name] Primary Care Provider: [name] Other Clinician: Referring Provider: Treating Provider/Extender:[name], Dema Severin, [name] in Treatment: 198 Vital Signs Time Taken: 11:00 Temperature (F): 97.8 Height (in): 70 Pulse (bpm): 50 Weight (lbs): 380.2 Respiratory Rate (breaths/min): 20 Body Mass Index (BMI): 54.5 Blood Pressure (mmHg): 156/55 Capillary Blood Glucose (mg/dl): 165 Reference Range: 80 - 120 mg / dl Electronic Signature(s) Signed: 11/10/2019 5:41:11 PM By: Deon Pilling Entered By: Deon Pilling on 11/10/2019 11:11:09

## 2019-11-10 NOTE — Progress Notes (Addendum)
[name] ([id number]) Visit Report for 11/10/2019 Chief Complaint Document Details Patient Name: Date of Service: Kevin, [name] 11/10/2019 10:00 AM Medical Record [id number] Patient Account Number: [id number] Date of Birth/Sex: Treating RN: 08-30-1951 (69 y.o. M) Primary Care Provider: Nicoletta [name] Other Clinician: Referring Provider: Treating Provider/Extender:[name] Weeks in Treatment: 198 Information Obtained from: Patient Chief Complaint patient is here for evaluation venous/lymphedema weeping Electronic Signature(s) Signed: 11/10/2019 10:30:39 AM By: Kevin Kelp PA-C Entered By: [name] on 11/10/2019 10:30:38 -------------------------------------------------------------------------------- HPI Details Patient Name: Date of Service: [name] 11/10/2019 10:00 AM Medical Record [id number] Patient Account Number: [id number] Date of Birth/Sex: Treating RN: [birth year] (69 y.o. M) Primary Care Provider: Nicoletta [name] Other Clinician: Referring Provider: Treating Provider/Extender:[name] Weeks in Treatment: 198 History of Present Illness HPI Description: Referred by PCP for consultation. Patient has long standing history of BLE venous stasis, no prior ulcerations. At beginning of month, developed cellulitis and weeping. Received IM Rocephin followed by Keflex and resolved. Wears compression stocking, appr 6 months old. Not sure strength. No present drainage. 01/22/16 this is a patient who is a type II diabetic on insulin. 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 He tells me he religiously wears pressure stockings of uncertain strength. He was here with weeping edema about 8 months ago but did not have an open wound. Roughly a month ago he had a reopening on his bilateral legs. He is been using bandages and Neosporin. He does not complain of pain. 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 Last BUN/creatinine I have is from 11/20/15 at 13 and 1.0 respectively 01/29/16; patient arrives today having tolerated the Profore wrap. He brought in his stockings and these are 18 mmHg stockings he bought from [employer]. The compression here is likely inadequate. He does not complain of pain or excessive drainage she has no systemic symptoms. The wound on the right looks improved as does the one on the left although one on the left is more substantial with still tissue at risk below the actual wound area on the bilateral posterior calf 02/05/16; patient arrives with poor edema control. He states that we did put a 4 layer compression on it last week. No weight appear 5 this. 02/12/16; the area on the posterior right Has healed. The left Has a substantial wound that has necrotic surface eschar that requires a debridement with a curette. 02/16/16;the patient called or a Nurse visit secondary to increased swelling. He had been in earlier in the week with his right leg healed. He was transitioned to is on pressure stocking on the right leg with the only open wound on the left, a substantial area on the left posterior calf. Note he has a history of severe lower extremity edema, he has a history of chronic atrial fibrillation but not heart failure per my notes but I'll need to research this. He is not complaining of chest pain shortness of breath or orthopnea. The intake nurse noted blisters on the previously closed right leg 02/19/16; this is the patient's regular visit day. I see him on Friday with escalating edema new wounds on the right leg and clear signs of at least right ventricular heart failure. I increased his Lasix to 40 twice a day. He is returning currently in follow-up. States he is noticed a decrease  in that the edema 02/26/16 patient's legs have much less  edema. There is nothing really open on the right leg. The left leg has improved condition of the large superficial wound on the posterior left leg 03/04/16; edema control is very much better. The patient's right leg wounds have healed. On the left leg he continues to have severe venous inflammation on the posterior aspect of the left leg. There is no tenderness and I don't think any of this is cellulitis. 03/11/16; patient's right leg is married healed and he is in his own stocking. The patient's left leg has deteriorated somewhat. There is a lot of erythema around the wound on the posterior left leg. There is also a significant rim of erythema posteriorly just above where the wrap would've ended there is a new wound in this location and a lot of tenderness. Can't rule out cellulitis in this area. 03/15/16; patient's right leg remains healed and he is in his own stocking. The patient's left leg is much better than last review. His major wound on the posterior aspect of his left Is almost fully epithelialized. He has 3 small injuries from the wraps. Really. Erythema seems a lot better on antibiotics 03/18/16; right leg remains healed and he is in his own stocking. The patient's left leg is much better. The area on the posterior aspect of the left calf is fully epithelialized. His 3 small injuries which were wrap injuries on the left are improved only one seems still open his erythema has resolved 03/25/16; patient's right leg remains healed and he is in his own stocking. There is no open area today on the left leg posterior leg is completely closed up. His wrap injuries at the superior aspect of his leg are also resolved. He looks as though he has some irritation on the dorsal ankle but this is fully epithelialized without evidence of infection. 03/28/16; we discharged this patient on Monday. Transitioned him into his own stocking. There were problems almost immediately with uncontrolled swelling weeping  edema multiple some of which have opened. He does not feel systemically unwell in particular no chest pain no shortness of breath and he does not feel 04/08/16; the edema is under better control with the Profore light wrap but he still has pitting edema. There is one large wound anteriorly 2 on the medial aspect of his left leg and 3 small areas on the superior posterior calf. Drainage is not excessive he is tolerating a Profore light well 04/15/16; put a Profore wrap on him last week. This is controlled is edema however he had a lot of pain on his left anterior foot most of his wounds are healed 04/22/16 once again the patient has denuded areas on the left anterior foot which he states are because his wrap slips up word. He saw his primary physician today is on Lasix 40 twice a day and states that he his weight is down 20 pounds over the last 3 months. 04/29/16: Much improved. left anterior foot much improved. He is now on Lasix 80 mg per day. Much improved edema control 05/06/16; I was hoping to be able to discharge him today however once again he has blisters at a low level of where the compression was placed last week mostly on his left lateral but also his left medial leg and a small area on the anterior part of the left foot. 05/09/16; apparently the patient went home after his appointment on 7/4 later in the evening developing pain  in his upper medial thigh together with subjective fever and chills although his temperature was not taken. The pain was so intense he felt he would probably have to call 911. However he then remembered that he had leftover doxycycline from a previous round of antibiotics and took these. By the next morning he felt a lot better. He called and spoke to one of our nurses and I approved doxycycline over the phone thinking that this was in relation to the wounds we had previously seen although they were definitely were not. The patient feels a lot better old fever no chills  he is still working. Blood sugars are reasonably controlled 05/13/16; patient is back in for review of his cellulitis on his anterior medial upper thigh. He is taking doxycycline this is a lot better. Culture I did of the nodular area on the dorsal aspect of his foot grew MRSA this also looks a lot better. 05/20/16; the patient is cellulitis on the medial upper thigh has resolved. All of his wound areas including the left anterior foot, areas on the medial aspect of the left calf and the lateral aspect of the calf at all resolved. He has a new blister on the left dorsal foot at the level of the fourth toe this was excised. No evidence of infection 05/27/16; patient continues to complain weeping edema. He has new blisterlike wounds on the left anterior lateral and posterior lateral calf at the top of his wrap levels. The area on his left anterior foot appears better. He is not complaining of fever, pain or pruritus in his feet. 05/30/16; the patient's blisters on his left anterior leg posterior calf all look improved. He did not increase the Lasix 100 mg as I suggested because he was going to run out of his 40 mg tablets. He is still having weeping edema of his toes 06/03/16; I renewed his Lasix at 80 mg once a day as he was about to run out when I last saw him. He is on 80 mg of Lasix now. I have asked him to cut down on the excessive amount of water he was drinking and asked him to drink according to his thirst mechanisms 06/12/2016 -- was seen 2 days ago and was supposed to wear his compression stockings at home but he is developed lymphedema and superficial blisters on the left lower extremity and hence came in for a review 06/24/16; the remaining wound is on his left anterior leg. He still has edema coming from between his toes. There is lymphedema here however his edema is generally better than when I last saw this. He has a history of atrial fibrillation but does not have a known history of  congestive heart failure nevertheless I think he probably has this at least on a diastolic basis. 07/01/16 I reviewed his echocardiogram from January 2017. This was essentially normal. He did not have LVH, EF of 55-60%. His right ventricular function was normal although he did have trivial tricuspid and pulmonic regurgitation. This is not audible on exam however. I increased his Lasix to do massive edema in his legs well above his knees I think in early July. He was also drinking an excessive amount of water at the time. 07/15/16; missed his appointment last week because of the Labor Day holiday on Monday. He could not get another appointment later in the week. Started to feel the wrap digging in superiorly so we remove the top half and the bottom half of his wrap.  He has extensive erythema and blistering superiorly in the left leg. Very tender. Very swollen. Edema in his foot with leaking edema fluid. He has not been systemically unwell 07/22/16; the area on the left leg laterally required some debridement. The medial wounds look more stable. His wrap injury wounds appear to have healed. Edema and his foot is better, weeping edema is also better. He tells me he is meeting with the supplier of the external compression pumps at work 08/05/16; the patient was on vacation last week in [facility]. His wrap is been on for an extended period of time. Also over the weekend he developed an extensive area of tender erythema across his anterior medial thigh. He took to doxycycline yesterday that he had leftover from a previous prescription. The patient complains of weeping edema coming out of his toes 08/08/16; I saw this patient on 10/2. He was tender across his anterior thigh. I put him on doxycycline. He returns today in follow-up. He does not have any open wounds on his lower leg, he still has edema weeping into his toes. 08/12/16; patient was seen back urgently today to follow-up for his extensive left  thigh cellulitis/erysipelas. He comes back with a lot less swelling and erythema pain is much better. I believe I gave him Augmentin and Cipro. His wrap was cut down as he stated a roll down his legs. He developed blistering above the level of the wrap that remained. He has 2 open blisters and 1 intact. 08/19/16; patient is been doing his primary doctor who is increased his Lasix from 40-80 once a day or 80 already has less edema. Cellulitis has remained improved in the left thigh. 2 open areas on the posterior left calf 08/26/16; he returns today having new open blisters on the anterior part of his left leg. He has his compression pumps but is not yet been shown how to use some vital representative from the supplier. 09/02/16 patient returns today with no open wounds on the left leg. Some maceration in his plantar toes 09/10/2016 -- Dr. Leanord Hawking had recently discharged him on 09/02/2016 and he has come right back with redness swelling and some open ulcers on his left lower extremity. He says this was caused by trying to apply his compression stockings and he's been unable to use this and has not been able to use his lymphedema pumps. He had some doxycycline leftover and he has started on this a few days ago. 09/16/16; there are no open wounds on his leg on the left and no evidence of cellulitis. 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 He does not complain of symptoms here. I am not clear use using his external compression pumps. 09/23/16; I have not seen this patient in 2 weeks. He canceled his appointment 10 days ago as he was going on vacation. He tells me that on Monday he noticed a large area on his posterior left leg which is been draining copiously and is reopened into a large wound. He is been using ABDs and the external part of his juxtalite, according to our nurse this was not on properly. 10/07/16; Still a substantial area on the  posterior left leg. Using silver alginate 10/14/16; in general better although there is still open area which looks healthy. Still using silver alginate. He reminds me that this happen before he left for [facility]. Today while he was showering in the morning. He had been using  his juxtalite's 10/21/16; the area on his posterior left leg is fully epithelialized. However he arrives today with a large area of tender erythema in his medial and posterior left thigh just above the knee. I have marked the area. Once again he is reluctant to consider hospitalization. I treated him with oral antibiotics in the past for a similar situation with resolution I think with doxycycline however this area it seems more extensive to me. He is not complaining of fever but does have chills and says states he is thirsty. His blood sugar today was in the 140s at home 10/25/16 the area on his posterior left leg is fully epithelialized although there is still some weeping edema. The large area of tenderness and erythema in his medial and posterior left thigh is a lot less tender although there is still a lot of swelling in this thigh. He states he feels a lot better. He is on doxycycline and Augmentin that I started last week. This will continued until Tuesday, December 26. I have ordered a duplex ultrasound of the left thigh rule out DVT whether there is an abscess something that would need to be drained I would also like to know. 11/01/16; he still has weeping edema from a not fully epithelialized area on his left posterior calf. Most of the rest of this looks a lot better. He has completed his antibiotics. His thigh is a lot better. Duplex ultrasound did not show a DVT in the thigh 11/08/16; he comes in today with more Denuded surface epithelium from the posterior aspect of his calf. There is no real evidence of cellulitis. The superior aspect of his wrap appears to have put quite an indentation in his leg just below  the knee and this may have contributed. He does not complain of pain or fever. We have been using silver alginate as the primary dressing. The area of cellulitis in the right thigh has totally resolved. He has been using his compression stockings once a week 11/15/16; the patient arrives today with more loss of epithelium from the posterior aspect of his left calf. He now has a fairly substantial wound in this area. The reason behind this deterioration isn't exactly clear although his edema is not well controlled. He states he feels he is generally more swollen systemically. He is not complaining of chest pain shortness of breath fever. Tells me he has an appointment with his primary physician in early February. He is on 80 mg of oral Lasix a day. He claims compliance with the external compression pumps. He is not having any pain in his legs similar to what he has with his recurrent cellulitis 11/22/16; the patient arrives a follow-up of his large area on his left lateral calf. This looks somewhat better today. He came in earlier in the week for a dressing change since I saw him a week ago. He is not complaining of any pain no shortness of breath no chest pain 11/28/16; the patient arrives for follow-up of his large area on the left lateral calf this does not look better. In fact it is larger weeping edema. The surface of the wound does not look too bad. We have been using silver alginate although I'm not certain that this is a dressing issue. 12/05/16; again the patient follows up for a large wound on the left lateral and left posterior calf this does not look better. There continues to be weeping edema necrotic surface tissue. More worrisome than this once again there  is erythema below the wound involving the distal Achilles and heel suggestive of cellulitis. He is on his feet working most of the day of this is not going well. We are changing his dressing twice a week to facilitate the  drainage. 12/12/16; not much change in the overall dimensions of the large area on the left posterior calf. This is very inflamed looking. I gave him an. Doxycycline last week does not really seem to have helped. He found the wrap very painful indeed it seems to of dog into his legs superiorly and perhaps around the heel. He came in early today because the drainage had soaked through his dressings. 12/19/16- patient arrives for follow-up evaluation of his left lower extremity ulcers. He states that he is using his lymphedema pumps once daily when there is "no drainage". He admits to not using his lipedema pumps while under current treatment. His blood sugars have been consistently between 150-200. 12/26/16; the patient is not using his compression pumps at home because of the wetness on his feet. I've advised him that I think it's important for him to use this daily. He finds his feet too wet, he can put a plastic bag over his legs while he is in the pumps. Otherwise I think will be in a vicious circle. We are using silver alginate to the major area on his left posterior calf 01/02/17; the patient's posterior left leg has further of all into 3 open wounds. All of them covered with a necrotic surface. He claims to be using his compression pumps once a day. His edema control is marginal. Continue with silver alginate 01/10/17; the patient's left posterior leg actually looks somewhat better. There is less edema, less erythema. Still has 3 open areas covered with a necrotic surface requiring debridement. He claims to be using his compression pumps once a day his edema control is better 01/17/17; the patient's left posterior calf look better last week when I saw him and his wrap was changed 2 days ago. He has noted increasing pain in the left heel and arrives today with much larger wounds extensive erythema extending down into the entire heel area especially tender medially. He is not systemically unwell CBGs  have been controlled no fever. Our intake nurse showed me limegreen drainage on his AVD pads. 01/24/17; his usual this patient responds nicely to antibiotics last week giving him Levaquin for presumed Pseudomonas. The whole entire posterior part of his leg is much better much less inflamed and in the case of his Achilles heel area much less tender. He has also had some epithelialization posteriorly there are still open areas here and still draining but overall considerably better 01/31/17- He has continue to tolerate the compression wraps. he states that he continues to use the lymphedema pumps daily, and can increase to twice daily on the weekends. He is voicing no complaints or concerns regarding his LLE ulcers 02/07/17-he is here for follow-up evaluation. He states that he noted some erythema to the left medial and anterior thigh, which he states is new as of yesterday. He is concerned about recurrent cellulitis. He states his blood sugars have been slightly elevated, this morning in the 180s 02/14/17; he is here for follow-up evaluation. When he was last here there was erythema superiorly from his posterior wound in his anterior thigh. He was prescribed Levaquin however a culture of the wound surface grew MRSA over the phone I changed him to doxycycline on Monday and things seem to be a  lot better. 02/24/17; patient missed his appointment on Friday therefore we changed his nurse visit into a physician visit today. Still using silver alginate on the large area of the posterior left thigh. He isn't new area on the dorsal left second toe 03/03/17; actually better today although he admits he has not used his external compression pumps in the last 2 days or so because of work responsibilities over the weekend. 03/10/17; continued improvement. External compression pumps once a day almost all of his wounds have closed on the posterior left calf. Better edema control 03/17/17; in general improved. He still  has 3 small open areas on the lateral aspect of his left leg however most of the area on the posterior part of his leg is epithelialized. He has better edema control. He has an ABD pad under his stocking on the right anterior lower leg although he did not let us look at that today. 03/24/17; patient arrives back in clinic today with no open areas however there are areas on the posterior left calf and anterior left calf that are less than 100% epithelialized. His edema is well controlled in the left lower leg. There is some pitting edema probably lymphedema in the left upper thigh. He uses compression pumps at home once per day. I tried to get him to do this twice a day although he is very reticent. 04/01/2017 -- for the last 2 days he's had significant redness, tenderness and weeping and came in for an urgent visit today. 04/07/17; patient still has 6 more days of doxycycline. He was seen by Dr. Meyer Russel last Wednesday for cellulitis involving the posterior aspect, lateral aspect of his Involving his heel. For the most part he is better there is less erythema and less weeping. He has been on his feet for 12 hours 2 over the weekend. Using his compression pumps once a day 04/14/17 arrives today with continued improvement. Only one area on the posterior left calf that is not fully epithelialized. He has intense bilateral venous inflammation associated with his chronic venous insufficiency disease and secondary lymphedema. We have been using silver alginate to the left posterior calf wound In passing he tells [place] today that the right leg but we have not seen in quite some time has an open area on it but he doesn't want [place] to look at this today states he will show this to [place] next week. 04/21/17; there is no open area on his left leg although he still reports some weeping edema. He showed [place] his right leg today which is the first time we've seen this leg in a long time. He has a large area of open wound on the  right leg anteriorly healthy granulation. Quite a bit of swelling in the right leg and some degree of venous inflammation. He told us about the right leg in passing last week but states that deterioration in the right leg really only happened over the weekend 04/28/17; there is no open area on the left leg although there is an irritated part on the posterior which is like a wrap injury. The wound on the right leg which was new from last week at least to [place] is a lot better. 05/05/17; still no open area on the left leg. Patient is using his new compression stocking which seems to be doing a good job of controlling the edema. He states he is using his compression pumps once per day. The right leg still has an open wound although it  is better in terms of surface area. Required debridement. A lot of pain in the posterior right Achilles marked tenderness. Usually this type of presentation this patient gives concern for an active cellulitis 05/12/17; patient arrives today with his major wound from last week on the right lateral leg somewhat better. Still requiring debridement. He was using his compression stocking on the left leg however that is reopened with superficial wounds anteriorly he did not have an open wound on this leg previously. He is still using his juxta light's once daily at night. He cannot find the time to do this in the morning as he has to be at work by 7 AM 05/19/17; right lateral leg wound looks improved. No debridement required. The concerning area is on the left posterior leg which appears to almost have a subcutaneous hemorrhagic component to it. We've been using silver alginate to all the wounds 05/26/17; the right lateral leg wound continues to look improved. However the area on the left posterior calf is a tightly adherent surface. [place] using silver alginate. Because of the weeping edema in his legs there is very little good alternatives. 06/02/17; the patient left here last week  looking quite good. Major wound on the left posterior calf and a small one on the right lateral calf. Both of these look satisfactory. He tells me that by Wednesday he had noted increased pain in the left leg and drainage. He called on Thursday and Friday to get an appointment here but we were blocked. He did not go to urgent care or his primary physician. He thinks he had a fever on Thursday but did not actually take his temperature. He has not been using his compression pumps on the left leg because of pain. I advised him to go to the emergency room today for IV antibiotics for stents of left leg cellulitis but he has refused I have asked him to take 2 days off work to keep his leg elevated and he has refused this as well. In view of this I'm going to call him and Augmentin and doxycycline. 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 However after a detailed discussion regarding the risk benefits and alternatives he refuses to get admitted to the hospital. With no other recourse I will continue him on oral antibiotics as before and hopefully he'll have his infectious disease consultation this week. 06/16/2017 -- the patient was seen today by the nurse practitioner at infectious disease Ms. Dixon. 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 He has also been advise Lotrimin cream locally. she also had wise IV antibiotics for his condition if it flares up 06/23/17; patient arrives today with drainage bilaterally although the remaining wound on the left posterior calf after cleaning up  today "highlighter yellow drainage" did not look too bad. Unfortunately he has had breakdown on the right anterior leg [previously this leg had not been open and he is using a black stocking] he went to see infectious disease and is been put on clindamycin 150 mg daily, I did not verify the  dose although I'm not familiar with using clindamycin in this dosing range, perhaps for prophylaxisoo 06/27/17; I brought this patient back today to follow-up on the wound deterioration on the right lower leg together with surrounding cellulitis. I started him on doxycycline 4 days ago. This area looks better however he comes in today with intense cellulitis on the medial part of his left thigh. This is not have a wound in this area. Extremely tender. We've been using silver alginate to the wounds on the right lower leg left lower leg with bilateral 4 layer compression he is using his external compression pumps once a day 07/04/17; patient's left medial thigh cellulitis looks better. He has not been using his compression pumps as his insert said it was contraindicated with cellulitis. His right leg continues to make improvements all the wounds are still open. We only have one remaining wound on the left posterior calf. Using silver alginate to all open areas. He is on doxycycline which I started a week ago and should be finishing I gave him Augmentin after Thursday's visit for the severe cellulitis on the left medial thigh which fortunately looks better 07/14/17; the patient's left medial thigh cellulitis has resolved. The cellulitis in his right lower calf on the right also looks better. All of his wounds are stable to improved we've been using silver alginate he has completed the antibiotics I have given him. He has clindamycin 150 mg once a day prescribed by infectious disease for prophylaxis, I've advised him to start this now. We have been using bilateral Unna boots over silver alginate to the wound  areas 07/21/17; the patient is been to see infectious disease who noted his recurrent problems with cellulitis. He was not able to tolerate prophylactic clindamycin therefore he is on amoxicillin 500 twice a day. He also had a second daily dose of Lasix added By Dr. Melford Aase but he is not taking this. Nor is he being completely compliant with his compression pumps a especially not this week. He has 2 remaining wounds one on the right posterior lateral lower leg and one on the left posterior medial lower leg. 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 The patient has Unna boots bilaterally. 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 He has bilateral Unna boots for compression and silver alginate to his wounds. Arrives today with his legs looking as good as I have seen him in quite some time. Not surprisingly his wounds look better as well with improvement on the right lateral leg venous insufficiency wound and also the left medial leg. He is still using the compression pumps once a day 08/11/17; both legs appear to be doing better wounds on the right lateral and left medial legs look better. Skin on the right leg quite good. He is been using silver alginate as the primary dressing. I'm going to use Anasept gel calcium alginate and maintain all the secondary dressings 08/18/17; the patient continues to actually do quite well. The area on his right lateral leg is just about closed the left medial also looks better although it is still moist in this area. His edema is well controlled we have been using Anasept gel with calcium alginate and the usual secondary dressings, 4 layer compression and once daily use of his compression  pumps "always been able to manage 09/01/17; the  patient continues to do reasonably well in spite of his trip to [place]. The area on the right lateral leg is epithelialized. Left is much better but still open. He has more edema and more chronic erythema on the left leg [venous inflammation] 09/08/17; he arrives today with no open wound on the right lateral leg and decently controlled edema. Unfortunately his left leg is not nearly as in his good situation as last week.he apparently had increasing edema starting on Saturday. He edema soaked through into his foot so used a plastic bag to walk around his home. The area on the medial right leg which was his open area is about the same however he has lost surface epithelium on the left lateral which is new and he has significant pain in the Achilles area of the left foot. He is already on amoxicillin chronically for prophylaxis of cellulitis in the left leg 09/15/17; he is completed a week of doxycycline and the cellulitis in the left posterior leg and Achilles area is as usual improved. He still has a lot of edema and fluid soaking through his dressings. There is no open wound on the right leg. He saw infectious disease NP today 09/22/17;As usual 1 we transition him from our compression wraps to his stockings things did not go well. He has several small open areas on the right leg. He states this was caused by the compression wrap on his skin although he did not wear this with the stockings over them. He has several superficial areas on the left leg medially laterally posteriorly. He does not have any evidence of active cellulitis especially involving the left Achilles The patient is traveling from [facility] Saturday going to [facility]. He states he isn't attempting to get an appointment with a heel objects wound center there to change his dressings. I am not completely certain whether this will work 10/06/17; the patient came in on Friday for a nurse visit and the nurse reported that his legs  actually look quite good. He arrives in clinic today for his regular follow-up visit. He has a new wound on his left third toe over the PIP probably caused by friction with his footwear. He has small areas on the left leg and a very superficial but epithelialized area on the right anterior lateral lower leg. Other than that his legs look as good as I've seen him in quite some time. We have been using silver alginate Review of systems; no chest pain no shortness of breath other than this a 10 point review of systems negative 10/20/17; seen by Dr. Meyer Russel last week. He had taken some antibiotics [doxycycline] that he had left over. Dr. Meyer Russel thought he had candida infection and declined to give him further antibiotics. He has a small wound remaining on the right lateral leg several areas on the left leg including a larger area on the left posterior several left medial and anterior and a small wound on the left lateral. The area on the left dorsal third toe looks a lot better. ROS; Gen.; no fever, respiratory no cough no sputum Cardiac no chest pain other than this 10 point review of system is negative 10/30/17; patient arrives today having [name] in the bathtub 3 days ago. It took him a while to get up. He has pain and maceration in the wounds on his left leg which have deteriorated. He has not been using his pumps he also has some  maceration on the right lateral leg. 11/03/17; patient continues to have weeping edema especially in the left leg. This saturates his dressings which were just put on on 12/27. As usual the doxycycline seems to take care of the cellulitis on his lower leg. He is not complaining of fever, chills, or other systemic symptoms. He states his leg feels a lot better on the doxycycline I gave him empirically. He also apparently gets injections at his primary doctor's officeo Rocephin for cellulitis prophylaxis. I didn't ask him about his compression pump compliance today I think  that's probably marginal. Arrives in the clinic with all of his dressings primary and secondary macerated full of fluid and he has bilateral edema 11/10/17; the patient's right leg looks some better although there is still a cluster of wounds on the right lateral. The left leg is inflamed with almost circumferential skin loss medially to laterally although we are still maintaining anteriorly. He does not have overt cellulitis there is a lot of drainage. He is not using compression pumps. We have been using silver alginate to the wound areas, there are not a lot of options here 11/17/17; the patient's right leg continues to be stable although there is still open wounds, better than last week. The inflammation in the left leg is better. Still loss of surface layer epithelium especially posteriorly. There is no overt cellulitis in the amount of edema and his left leg is really quite good, tells me he is using his compression pumps once a day. 11/24/17; patient's right leg has a small superficial wound laterally this continues to improve. The inflammation in the left leg is still improving however we have continuous surface layer epithelial loss posteriorly. There is no overt cellulitis in the amount of edema in both legs is really quite good. He states he is using his compression pumps on the left leg once a day for 5 out of 7 days 12/01/17; very small superficial areas on the right lateral leg continue to improve. Edema control in both legs is better today. He has continued loss of surface epithelialization and left posterior calf although I think this is better. We have been using silver alginate with large number of absorptive secondary dressings 4 layer on the left Unna boot on the right at his request. He tells me he is using his compression pumps once a day 12/08/17; he has no open area on the right leg is edema control is good here. On the left leg however he has marked erythema and tenderness  breakdown of skin. He has what appears to be a wrap injury just distal to the popliteal fossa. 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 There was swelling erythema and breakdown in the right leg. He has much worse skin breakdown in the left leg as well multiple open areas medially and posteriorly as well as laterally. He tells me he has been using his compression pumps but tells me he feels that the drainage out of his leg is worse when he uses a compression pumps. To be fair to him he is been saying this for a while however I don't know that I have really been listening to this. I wonder if the compression pumps are  working properly 12/22/17;. Once again he arrives with severe erythema, weeping edema from the left greater than right leg. Noncompliance with compression pumps. New this visit he is complaining of pain on the lateral aspect of the right leg and the medial aspect of his right thigh. He apparently saw his cardiologist Dr. [name] who was ordered an echocardiogram area and I think this is a step in the right direction 12/25/17; started his doxycycline Monday night. There is still intense erythema of the right leg especially in the anterior thigh although there is less tenderness. The erythema around the wound on the right lateral calf also is less tender. He still complaining of pain in the left heel. His wounds are about the same right lateral left medial left lateral. Superficial but certainly not close to closure. He denies being systemically unwell no fever chills no abdominal pain no diarrhea 12/29/17; back in follow-up of his extensive right calf and right thigh cellulitis. I added amoxicillin to cover  possible doxycycline resistant strep. This seems to of done the trick he is in much less pain there is much less erythema and swelling. He has his echocardiogram at 11:00 this morning. X-ray of the left heel was also negative. 01/05/18; the patient arrived with his edema under much better control. Now that he is retired he is able to use his compression pumps daily and sometimes twice a day per the patient. He has a wound on the right leg the lateral wound looks better. Area on the left leg also looks a lot better. He has no evidence of cellulitis in his bilateral thighs I had a quick peak at his echocardiogram. He is in normal ejection fraction and normal left ventricular function. He has moderate pulmonary hypertension moderately reduced right ventricular function. One would have to wonder about chronic sleep apnea although he says he doesn't snore. He'll review the echocardiogram with his cardiologist. 01/12/18; the patient arrives with the edema in both legs under exemplary control. He is using his compression pumps daily and sometimes twice daily. His wound on the right lateral leg is just about closed. He still has some weeping areas on the posterior left calf and lateral left calf although everything is just about closed here as well. I have spoken with Aldean Baker who is the patient's nurse practitioner and infectious disease. She was concerned that the patient had not understood that the parenteral penicillin injections he was receiving for cellulitis prophylaxis was actually benefiting him. I don't think the patient actually saw that I would tend to agree we were certainly dealing with less infections although he had a serious one last month. 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 He is healed. He'll be placed in juxtalite compression wraps and increase his lymphedema pumps to twice daily. We will follow up again next week to ensure there are no  issues with the new regiment. 01/20/18-he is here for evaluation of bilateral lower extremity weeping edema. Yesterday he was placed in compression wrap to the right lower extremity and compression stocking to left lower shrubbery. He states he uses lymphedema pumps last night and again this morning and noted a blister to the left lower extremity. On exam he was noted to have drainage to the right lower extremity. He will be placed in Unna boots bilaterally and follow-up next week 01/26/18; patient was actually discharged a week ago to his own juxta light stockings only to return the next day with bilateral lower  extremity weeping edema.he was placed in bilateral Unna boots. He arrives today with pain in the back of his left leg. There is no open area on the right leg however there is a linear/wrap injury on the left leg and weeping edema on the left leg posteriorly. I spoke with infectious disease about 10 days ago. They were disappointed that the patient elected to discontinue prophylactic intramuscular penicillin shots as they felt it was particularly beneficial in reducing the frequency of his cellulitis. I discussed this with the patient today. He does not share this view. He'll definitely need antibiotics today. Finally he is traveling to [place] and trauma leaving this Saturday and returning a week later and he does not travel with his pumps. He is going by car 01/30/18; patient was seen 4 days ago and brought back in today for review of cellulitis in the left leg posteriorly. I put him on amoxicillin this really hasn't helped as much as I might like. He is also worried because he is traveling to [facility] trauma by car. Finally we will be rewrapping him. There is no open area on the right leg over his left leg has multiple weeping areas as usual 02/09/18; The same wrap on for 10 days. He did not pick up the last doxycycline I prescribed for him. He apparently took 4 days worth he already had.  There is nothing open on his right leg and the edema control is really quite good. He's had damage in the left leg medially and laterally especially probably related to the prolonged use of Unna boots 02/12/18; the patient arrived in clinic today for a nurse visit/wrap change. He complained of a lot of pain in the left posterior calf. He is taking doxycycline that I previously prescribed for him. Unfortunately even though he used his stockings and apparently used to compression pumps twice a day he has weeping edema coming out of the lateral part of his right leg. This is coming from the lower anterior lateral skin area. 02/16/18; the patient has finished his doxycycline and will finish the amoxicillin 2 days. The area of cellulitis in the left calf posteriorly has resolved. He is no longer having any pain. He tells me he is using his compression pumps at least once a day sometimes twice. 02/23/18; the patient finished his doxycycline and Amoxil last week. On Friday he noticed a small erythematous circle about the size of a quarter on the left lower leg just above his ankle. This rapidly expanded and he now has erythema on the lateral and posterior part of the thigh. This is bright red. Also has an area on the dorsal foot just above his toes and a tender area just below the left popliteal fossa. He came off his prophylactic penicillin injections at his own insistence one or 2 months ago. This is obviously deteriorated since then 03/02/18; patient is on doxycycline and Amoxil. Culture I did last week of the weeping area on the back of his left calf grew group B strep. I have therefore renewed the amoxicillin 500 3 times a day for a further week. He has not been systemically unwell. Still complaining of an area of discomfort right under his left popliteal fossa. There is no open wound on the right leg. He tells me that he is using his pumps twice a day on most days 03/09/18; patient arrives in clinic  today completing his amoxicillin today. The cellulitis on his left leg is better. Furthermore he tells me that  he had intramuscular penicillin shots that his primary care office today. However he also states that the wrap on his right leg fell down shortly after leaving clinic last week. He developed a large blister that was present when he came in for a nurse visit later in the week and then he developed intense discomfort around this area.He tells me he is using his compression pumps 03/16/18; the patient has completed his doxycycline. The infectious part of this/cellulitis in the left heel area left popliteal area is a lot better. He has 2 open areas on the right calf. Still areas on the left calf but this is a lot better as well. 03/24/18; the patient arrives complaining of pain in the left popliteal area again. He thinks some of this is wrap injury. He has no open area on the right leg and really no open area on the left calf either except for the popliteal area. He claims to be compliant with the compression pumps 03/31/18; I gave him doxycycline last week because of cellulitis in the left popliteal area. This is a lot better although the surface epithelium is denuded off and response to this. He arrives today with uncontrolled edema in the right calf area as well as a fingernail injury in the right lateral calf. There is only a few open areas on the left 04/06/18; I gave him amoxicillin doxycycline over the last 2 weeks that the amoxicillin should be completing currently. He is not complaining of any pain or systemic symptoms. The only open areas see has is on the right lateral lower leg paradoxically I cannot see anything on the left lower leg. He tells me he is using his compression pumps twice a day on most days. Silver alginate to the wounds that are open under 4 layer compression 04/13/18; he completed antibiotics and has no new complaints. Using his compression pumps. Silver alginate  that anything that's opened 04/20/18; he is using his compression pumps religiously. Silver alginate 4 layer compression anything that's opened. He comes in today with no open wounds on the left leg but 3 on the right including a new one posteriorly. He has 2 on the right lateral and one on the right posterior. He likes Unna boots on the right leg for reasons that aren't really clear we had the usual 4 layer compression on the left. It may be necessary to move to the 4 layer compression on the right however for now I left them in the Unna boots 04/27/18; he is using his compression pumps at least once a day. He has still the wounds on the right lateral calf. The area right posteriorly has closed. He does not have an open wound on the left under 4 layer compression however on the dorsal left foot just proximal to the toes and the left third toe 2 small open areas were identified 05/11/18; he has not uses compression pumps. The areas on the right lateral calf have coalesced into one large wound necrotic surface. On the left side he has one small wound anteriorly however the edema is now weeping out of a large part of his left leg. He says he wasn't using his pumps because of the weeping fluid. I explained to him that this is the time he needs to pump more 05/18/18; patient states he is using his compression pumps twice a day. The area on the right lateral large wound albeit superficial. On the left side he has innumerable number of small new wounds on  the left calf particularly laterally but several anteriorly and medially. All these appear to have healthy granulated base these look like the remnants of blisters however they occurred under compression. The patient arrives in clinic today with his legs somewhat better. There is certainly less edema, less multiple open areas on the left calf and the right anterior leg looks somewhat better as well superficial and a little smaller. However he relates pain  and erythema over the last 3-4 days in the thigh and I looked at this today. He has not been systemically unwell no fever no chills no change in blood sugar values 05/25/18; comes in today in a better state. The severe cellulitis on his left leg seems better with the Keflex. Not as tender. He has not been systemically unwell Hard to find an open wound on the left lower leg using his compression pumps twice a day The confluent wounds on his right lateral calf somewhat better looking. These will ultimately need debridement I didn't do this today. 06/01/18; the severe cellulitis on the left anterior thigh has resolved and he is completed his Keflex. There is no open wound on the left leg however there is a superficial excoriation at the base of the third toe dorsally. Skin on the bottom of his left foot is macerated looking. The left the wounds on the lateral right leg actually looks some better although he did require debridement of the top half of this wound area with an open curet 06/09/18 on evaluation today patient appears to be doing poorly in regard to his right lower extremity in particular this appears to likely be infected he has very thick purulent discharge along with a bright green tent to the discharge. This makes me concerned about the possibility of pseudomonas. He's also having increased discomfort at this point on evaluation. Fortunately there does not appear to be any evidence of infection spreading to the other location at this time. 06/16/18 on evaluation today patient appears to actually be doing fairly well. His ulcer has actually diminished in size quite significantly at this point which is good news. Nonetheless he still does have some evidence of infection he did see infectious disease this morning before coming here for his appointment. I did review the results of their evaluation and their note today. They did actually have him discontinue the Cipro and initiate treatment with  linezolid at this time. He is doing this for the next seven days and they recommended a follow-up in four months with them. He is the keep a log of the need for intermittent antibiotic therapy between now and when he falls back up with infectious disease. This will help them gaze what exactly they need to do to try and help them out. 06/23/18; the patient arrives today with no open wounds on the left leg and left third toe healed. He is been using his compression pumps twice a day. On the right lateral leg he still has a sizable wound but this is a lot better than last time I saw this. 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 Has been using silver alginate under 4 layer compression 06/30/18; the only open wound he has is on the right lateral leg and this looks healthy. No debridement is required. We have been using silver alginate. He does not have an open wound on the left leg. There is apparently some drainage  from the dorsal proximal third toe on the left although I see no open wound here. 07/03/18 on evaluation today patient was actually here just for a nurse visit rapid change. However when he was here on Wednesday for his rat change due to having been healed on the left and then developing blisters we initiated the wrap again knowing that he would be back today for [place] to reevaluate and see were at. Unfortunately he has developed some cellulitis into the proximal portion of his right lower extremity even into the region of his thigh. 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 He was placed on one as what at that point. Nonetheless he is done with that and has been tolerating it well otherwise. Doxycycline which in the past really did not seem to be effective for him. Nonetheless I think the best option may be for [place] to definitely reinitiate the  antibiotics for a longer period of time. 07/07/18; since I last saw this patient a week ago he has had a difficult time. At that point he did not have an open wound on his left leg. We transitioned him into juxta light stockings. He was apparently in the clinic the next day with blisters on the left lateral and left medial lower calf. He also had weeping edema fluid. He was put back into a compression wrap. He was also in the clinic on Friday with intense erythema in his right thigh. Per the patient he was started on Bactrim however that didn't work at all in terms of relieving his pain and swelling. He has taken 3 doxycycline that he had left over from last time and that seems to of helped. He has blistering on the right thigh as well. 07/14/18; the erythema on his right thigh has gotten better with doxycycline that he is finishing. 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 Presumably this cellulitis in the thigh was not related to that although I think this is a bit concerning going forward. He still has an area on the right lateral calf the blister on the right medial calf just below the knee that was discussed above. On the left 2 small open areas left medial and left lateral. Edema control is adequate. He is using his compression pumps twice a day 07/20/18; continued improvement in the condition of both legs especially the edema in his bilateral thighs. He tells me he is been losing weight through a combination of diet and exercise. He is using his compression pumps twice a day. So overall she made to the remaining wounds 07/27/2018; continued improvement in condition of both legs. His edema is well controlled. The area on the right lateral leg is just about closed he had one blisters show up on the medial left upper calf. We have him in 4 layer compression. He is going on a 10-day trip to [place], [place] and  [place]. He will be driving. He wants to wear Unna boots because of the lessening amount of constriction. He will not use compression pumps while he is away 08/05/18 on evaluation today patient actually appears to be doing decently well all things considered in regard to his bilateral lower extremities. The worst ulcer is actually only posterior aspect of his left lower extremity with a four layer compression wrap cut into his leg a couple weeks back. He did have  a trip and actually had [employer] wraps for the trip that he is worn since he was last here. Nonetheless he feels like the [employer] wraps actually do better for him his swelling is up a little bit but he also with his trip was not taking his Lasix on a regular set schedule like he was supposed to be. He states that obviously the reason being that he cannot drive and keep going without having to urinate too frequently which makes it difficult. He did not have his pumps with him while he was away either which I think also maybe playing a role here too. 08/13/2018; the patient only has a small open wound on the right lateral calf which is a big improvement in the last month or 2. He also has the area posteriorly just below the posterior fossa on the left which I think was a wrap injury from several weeks ago. He has no current evidence of cellulitis. He tells me he is back into his compression pumps twice a day. He also tells me that while he was at the laundromat somebody stole a section of his extremitease stockings 08/20/2018; back in the clinic with a much improved state. He only has small areas on the right lateral mid calf which is just about healed. This was is more substantial area for quite a prolonged period of time. He has a small open area on the left anterior tibia. The area on the posterior calf just below the popliteal fossa is closed today. He is using his compression pumps twice a day 08/28/2018; patient has no open wound  on the right leg. He has a smattering of open areas on the calf with some weeping lymphedema. More problematically than that it looks as though his wraps of slipped down in his usual he has very angry upper area of edema just below the right medial knee and on the right lateral calf. He has no open area on his feet. [place] I will send him in an antibiotic. We will continue to wrap the right leg. We ordered extremitease stockings for him last week and I plan to transition the right leg to a stocking when he gets home which will be in 10 days time. As usual he is very reluctant to take his pumps with him when he travels 09/07/2018; [place] He shows me a picture of his left leg in the mid part of his trip last week with intense fire engine erythema. The picture look bad enough I would have considered sending him to the hospital. Instead he went [place] They did not prescribe him antibiotics but he did take some doxycycline he had leftover from a previous visit. I had given him trimethoprim sulfamethoxazole before he left this did not work according to the patient. This is resulted in some improvement fortunately. He comes back with a large wound on the left posterior calf. Smaller area on the left anterior tibia. Denuded blisters on the dorsal left foot over his toes. Does not have much in the way of wounds on the right leg although he does have a very tender area on the right posterior area just below the popliteal fossa also suggestive of infection. He promises me he is back on his pumps twice a day 09/15/2018; the intense cellulitis in his left lower calf is a lot better. The wound area on  the posterior left calf is also so better. However he has reasonably extensive wounds on the dorsal aspect of his second and third toes and the proximal foot just at the base of the toes. There is  nothing open on the right leg 09/22/2018; the patient has excellent edema control in his legs bilaterally. He is using his external compression pumps twice a day. He has no open area on the right leg and only the areas in the left foot dorsally second and third toe area on the left side. He does not have any signs of active cellulitis. 10/06/2018; the patient has good edema control bilaterally. He has no open wound on the right leg. There is a blister in the posterior aspect of his left calf that we had to deal with today. He is using his compression pumps twice a day. There is no signs of active cellulitis. We have been using silver alginate to the wound areas. He still has vulnerable areas on the base of his left first second toes dorsally He has a his extremities stockings and we are going to transition him today into the stocking on the right leg. He is cautioned that he will need to continue to use the compression pumps twice a day. If he notices uncontrolled edema in the right leg he may need to go to 3 times a day. 10/13/2018; the patient came in for a nurse check on Friday he has a large flaccid blister on the right medial calf just below the knee. We unroofed this. He has this and a new area underneath the posterior mid calf which was undoubtedly a blister as well. He also has several small areas on the right which is the area we put his extremities stocking on. 10/19/2018; the patient went to see infectious disease this morning I am not sure if that was a routine follow-up in any case the doxycycline I had given him was discontinued and started on linezolid. He has not started this. It is easy to look at his left calf and the inflammation and think this is cellulitis however he is very tender in the tissue just below the popliteal fossa and I have no doubt that there is infection going on here. He states the problem he is having is that with the compression pumps the edema goes down and  then starts walking the wrap falls down. We will see if we can adhere this. He has 1 or 2 minuscule open areas on the right still areas that are weeping on the posterior left calf, the base of his left second and third toes 10/26/18; back today in clinic with quite of skin breakdown in his left anterior leg. This may have been infection the area below the popliteal fossa seems a lot better however tremendous epithelial loss on the left anterior mid tibia area over quite inexpensive tissue. He has 2 blisters on the right side but no other open wound here. 10/29/2018; came in urgently to see [place] today and we worked him in for review. He states that the 4 layer compression on the right leg caused pain he had to cut it down to roughly his mid calf this caused swelling above the wrap and he has blisters and skin breakdown today. As a result of the pain he has not been using his pumps. Both legs are a lot more edematous and there is a lot of weeping fluid. 11/02/18; arrives in clinic with continued difficulties in the right leg> left.  Leg is swollen and painful. multiple skin blisters and new open areas especially laterally. He has not been using his pumps on the right leg. 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 He is not systemically unwell. 11/09/2018; the patient claims he is being compliant with his pumps. He is finished the doxycycline I gave him last week. Culture I did of the wound on the right lateral leg showed a few very resistant methicillin staph aureus. This was resistant to doxycycline. Nevertheless he states the pain in the leg is a lot better which makes me wonder if the cultured organism was not really what was causing the problem nevertheless this is a very dangerous organism to be culturing out of any wound. His right leg is still a lot larger than the left. He is using an Radio broadcast assistant on this area, he blames a 4-layer compression for  causing the original skin breakdown which I doubt is true however I cannot talk him out of it. We have been using silver alginate to all of these areas which were initially blisters 11/16/2018; patient is being compliant with his external compression pumps at twice a day. Miraculously he arrives in clinic today with absolutely no open wounds. He has better edema control on the left where he has been using 4 layer compression versus wound of wounds on the right and I pointed this out to him. There is no inflammation in the skin in his lower legs which is also somewhat unusual for him. There is no open wounds on the dorsal left foot. He has extremitease stockings at home and I have asked him to bring these in next week. 11/25/18 patient's lower extremity on examination today on the left appears for the most part to be wound free. He does have an open wound on the lateral aspect of the right lower extremity but this is minimal compared to what I've seen in past. He does request that we go ahead and wrap the left leg as well even though there's nothing open just so hopefully it will not reopen in short order. 1/28; patient has superficial open wounds on the right lateral calf left anterior calf and left posterior calf. His edema control is adequate. 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 We have been using silver alginate as the primary dressing. He claims compliance with his compression pumps 2/4; patient has superficial open wounds on numerous areas of his left calf and again one on the left dorsal foot. The areas on the right lateral calf have healed. The cellulitis that I gave him doxycycline for last week is also resolved this was mostly on the left anterior calf just below the tibial tuberosity. His edema looks fairly well-controlled. He tells me he went to see his primary doctor today and had blood work  ordered 2/11; once again he has several open areas on the left calf left tibial area. Most of these are small and appear to have healthy granulation. He does not have anything open on the right. The edema and control in his thighs is pretty good which is usually a good indication he has been using his pumps as requested. 2/18; he continues to have several small areas on the left calf and left tibial area. Most of these are small healthy granulation. We put him in his stocking on the right leg last week and  he arrives with a superficial open area over the right upper tibia and a fairly large area on the right lateral tibia in similar condition. His edema control actually does not look too bad, he claims to be using his compression pumps twice a day 2/25. Continued small areas on the left calf and left tibial area. New areas especially on the right are identified just below the tibial tuberosity and on the right upper tibia itself. There are also areas of weeping edema fluid even without an obvious wound. He does not have a considerable degree of lymphedema but clearly there is more edema here than his skin can handle. He states he is using the pumps twice a day. We have an Unna boot on the right and 4 layer compression on the left. 3/3; he continues to have an area on the right lateral calf and right posterior calf just below the popliteal fossa. There is a fair amount of tenderness around the wound on the popliteal fossa but I did not see any evidence of cellulitis, could just be that the wrap came down and rubbed in this area. He does not have an open area on the left leg however there is an area on the left dorsal foot at the base of the third toe We have been using silver alginate to all wound areas 3/10; he did not have an open area on his left leg last time he was here a week ago. Today he arrives with a horizontal wound just below the tibial tuberosity and an area on the left lateral calf.  He has intense erythema and tenderness in this area. The area is on the right lateral calf and right posterior calf better than last week. We have been using silver alginate as usual 3/18 - Patient returns with 3 small open areas on left calf, and 1 small open area on right calf, the skin looks ok with no significant erythema, he continues the UNA boot on right and 4 layer compression on left. The right lateral calf wound is closed , the right posterior is small area. we will continue silver alginate to the areas. 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 He is been tolerating the dressing changes without complication. Fortunately she has made excellent improvement in regard to the overall status of his wounds. Unfortunately every time we cease wrapping him he ends up reopening in causing more significant issues at that point. Again I'm unsure of the best direction to take although I think the lymphedema clinic may be appropriate for him. 02/03/19 on evaluation today patient appears to be doing well in regard to the wounds that we saw him for last week unfortunately he has a new area on the proximal portion of his right medial/posterior lower extremity where the wrap somewhat slowed down and caused swelling and a blister to rub and open. Unfortunately this is the only opening that he has on either leg at this point. 02/17/19 on evaluation today patient's bilateral lower extremities appear to be doing well. He still completely healed in regard to the left lower extremity. In regard to the right lower extremity the area where the wrap and slid down and caused the blister still seems to be slightly open although this is dramatically better than during the last evaluation two weeks  ago. I'm very pleased with  the way this stands overall. 03/03/19 on evaluation today patient appears to be doing well in regard to his right lower extremity in general although he did have a new blister open this does not appear to be showing any evidence of active infection at this time. Fortunately there's No fevers, chills, nausea, or vomiting noted at this time. Overall I feel like he is making good progress it does feel like that the right leg will we perform the [employer] seems to do with a bit better than three layer wrap on the left which slid down on him. We may switch to doing bilateral in the book wraps. 5/4; I have not seen Mr. Johnson in quite some time. According to our case manager he did not have an open wound on his left leg last week. He had 1 remaining wound on the right posterior medial calf. He arrives today with multiple openings on the left leg probably were blisters and/or wrap injuries from Unna boots. I do not think the Unna boot's will provide adequate compression on the left. I am also not clear about the frequency he is using the compression pumps. 03/17/19 on evaluation today patient appears to be doing excellent in regard to his lower extremities compared to last week's evaluation apparently. He had gotten significantly worse last week which is unfortunate. The [employer] wrap on the left did not seem to do very well for him at all and in fact it didn't control his swelling significantly enough he had an additional outbreak. Subsequently we go back to the four layer compression wrap on the left. This is good news. At least in that he is doing better and the wound seem to be killing him. He still has not heard anything from the lymphedema clinic. 03/24/19 on evaluation today patient actually appears to be doing much better in regard to his bilateral lower Trinity as compared to last week when I saw him. Fortunately there's no signs of active infection at this time. He has been tolerating the  dressing changes without complication. Overall I'm extremely pleased with the progress and appearance in general. 04/07/19 on evaluation today patient appears to be doing well in regard to his bilateral lower extremities. His swelling is significantly down from where it was previous. With that being said he does have a couple blisters still open at this point but fortunately nothing that seems to be too severe and again the majority of the larger openings has healed at this time. 04/14/19 on evaluation today patient actually appears to be doing quite well in regard to his bilateral lower extremities in fact I'm not even sure there's anything significantly open at this time at any site. Nonetheless he did have some trouble with these wraps where they are somewhat irritating him secondary to the fact that he has noted that the graph wasn't too close down to the end of this foot in a little bit short as well up to his knee. Otherwise things seem to be doing quite well. 04/21/19 upon evaluation today patient's wound bed actually showed evidence of being completely healed in regard to both lower extremities which is excellent news. There does not appear to be any signs of active infection which is also good news. I'm very pleased in this regard. No fevers, chills, nausea, or vomiting noted at this time. 04/28/19 on evaluation today patient appears to be doing a little bit worse in regard to both lower extremities  on the left mainly due to the fact that when he went infection disease the wrap was not wrapped quite high enough he developed a blister above this. On the right he is a small open area of nothing too significant but again this is continuing to give him some trouble he has been were in the Velcro compression that he has at home. 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 He's been tolerating the dressing changes without complication.  Fortunately there's no signs of active infection at this time. No fevers, chills, nausea, or vomiting noted at this time. We have been trying to get an appointment with her lymphedema clinic in [facility] [facility] but unfortunately nobody can get them on phone with not been able to even fax information over the patient likewise is not been able to get in touch with them. Overall I'm not sure exactly what's going on here with to reach out again today. 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 Still having a lot of drainage unfortunately. He tells me especially in the left but even on the right. There's no signs of active infection which is good news we've been using so ratcheted up to this point. 05/19/19 on evaluation today patient actually appears to be doing quite well with regard to his left lower extremity which is great news. Fortunately in regard to the right lower extremity has an issues with his wrap and he subsequently did remove this from what I'm understanding. Nonetheless long story short is what he had rewrapped once he removed it subsequently had maggots underneath this wrap whenever he came in for evaluation today. With that being said they were obviously completely cleaned away by the nursing staff. The visit today which is excellent news. However he does appear to potentially have some infection around the right ankle region where the maggots were located as well. He will likely require anabiotic therapy today. 05/26/19 on evaluation today patient actually appears to be doing much better in regard to his bilateral lower extremities. I feel like the infection is under much better control. With that being said there were maggots noted when the wrap was removed yet again today. Again this could have potentially been left over from previous although at this time there does not appear to be any  signs of significant drainage there was obviously on the wrap some drainage as well this contracted gnats or otherwise. Either way I do not see anything that appears to be doing worse in my pinion and in fact I think his drainage has slowed down quite significantly likely mainly due to the fact to his infection being under better control. 06/02/2019 on evaluation today patient actually appears to be doing well with regard to his bilateral lower extremities there is no signs of active infection at this time which is great news. With that being said he does have several open areas more so on the right than the left but nonetheless these are all significantly better than previously noted. 06/09/2019 on evaluation today patient actually appears to be doing well. His wrap stayed up and he did not cause any problems he had more drainage on the right compared to the left but overall I do not see any major issues at this time which is great news. 06/16/2019 on evaluation today patient appears to be doing excellent with regard  to his lower extremities the only area that is open is a new blister that can have opened as of today on the medial ankle on the left. Other than this he really seems to be doing great I see no major issues at this point. 06/23/2019 on evaluation today patient appears to be doing quite well with regard to his bilateral lower extremities. In fact he actually appears to be almost completely healed there is a small area of weeping noted of the right lower extremity just above the ankle. Nonetheless fortunately there is no signs of active infection at this time which is good news. No fevers, chills, nausea, vomiting, or diarrhea. 8/24; the patient arrived for a nurse visit today but complained of very significant pain in the left leg and therefore I was asked to look at this. Noted that he did not have an open area on the left leg last week nevertheless this was wrapped. The patient states  that he is not been able to put his compression pumps on the left leg because of the discomfort. He has not been systemically unwell 06/30/2019 on evaluation today patient unfortunately despite being excellent last week is doing much worse with regard to his left lower extremity today. In fact he had to come in for a nurse on Monday where his left leg had to be rewrapped due to excessive weeping Dr. Leanord Hawking placed him on doxycycline at that point. Fortunately there is no signs of active infection Systemically at this time which is good news. 07/07/2019 in regard to the patient's wounds today he actually seems to be doing well with his right lower extremity there really is nothing open or draining at this point this is great news. Unfortunately the left lower extremity is given him additional trouble at this time. There does not appear to be any signs of active infection nonetheless he does have a lot of edema and swelling noted at this point as well as blistering all of which has led to a much more poor appearing leg at this time compared to where it was 2 weeks ago when it was almost completely healed. Obviously this is a little discouraging for the patient. He is try to contact the lymphedema clinic in [place] he has not been able to get through to them. 07/14/2019 on evaluation today patient actually appears to be doing slightly better with regard to his left lower extremity ulcers. Overall I do feel like at least at the top of the wrap that we have been placing this area has healed quite nicely and looks much better. The remainder of the leg is showing signs of improvement. Unfortunately in the thigh area he still has an open region on the left and again on the right he has been utilizing just a Band-Aid on an area that also opened on the thigh. Again this is an area that were not able to wrap although we did do an Ace wrap to provide some compression that something that obviously is a little  less effective than the compression wraps we have been using on the lower portion of the leg. He does have an appointment with the lymphedema clinic in [facility] on Friday. 07/21/2019 on evaluation today patient appears to be doing better with regard to his lower extremity ulcers. He has been tolerating the dressing changes without complication. Fortunately there is no signs of active infection at this time. No fevers, chills, nausea, vomiting, or diarrhea. I did receive the paperwork from the  physical therapist [place]. Subsequently I signed off on that this morning and sent that back to him for further progression with the treatment plan. 07/28/2019 on evaluation today patient appears to be doing very well with regard to his right lower extremity where I do not see any open wounds at this point. Fortunately he is feeling great as far as that is concerned as well. In regard to the left lower extremity he has been having issues with still several areas of weeping and edema although the upper leg is doing better his lower leg still I think is going require the compression wrap at this time. No fevers, chills, nausea, vomiting, or diarrhea. 08/04/2019 on evaluation today patient unfortunately is having new wounds on the right lower extremity. Again we have been using Unna boot wrap on that side. We switched him to using his juxta lite wrap at home. With that being said he tells me he has been using it although his legs extremely swollen and to be honest really does not appear that he has been. I cannot know that for sure however. Nonetheless he has multiple new wounds on the right lower extremity at this time. Obviously we will have to see about getting this rewrapped for him today. 08/11/2019 on evaluation today patient appears to be doing fairly well with regard to his wounds. He has been tolerating the dressing changes including the compression wraps without  complication. He still has a lot of edema in his upper thigh regions bilaterally he is supposed to be seeing the lymphedema clinic on the 15th of this month once his wraps arrive for the upper part of his legs. 08/18/2019 on evaluation today patient appears to be doing well with regard to his bilateral lower extremities at this point. He has been tolerating the dressing changes without complication. Fortunately there is no signs of active infection which is also good news. He does have a couple weeping areas on the first and second toe of the right foot he also has just a small area on the left foot upper leg and a small area on the left lower leg but overall he is doing quite well in my opinion. He is supposed to be getting his wraps shortly in fact tomorrow and then subsequently is seeing the lymphedema clinic next Wednesday on the 21st. Of note he is also leaving on the 25th to go on vacation for a week to the beach. For that reason and since there is some uncertainty about what there can be doing at lymphedema clinic next Wednesday I am get a make an appointment for next Friday here for us to see what we need to do for him prior to him leaving for vacation. 10/23; patient arrives in considerable pain predominantly in the upper posterior calf just distal to the popliteal fossa also in the wound anteriorly above the major wound. This is probably cellulitis and he has had this recurrently in the past. He has no open wound on the right side and he has had an Radio broadcast assistantUnna boot in that area. Finally I note that he has an area on the left posterior calf which by enlarge is mostly epithelialized. This protrudes beyond the borders of the surrounding skin in the setting of dry scaly skin and lymphedema. [place] Per his longstanding pattern, he will not take his compression pumps with him predominantly out of fear that they will be stolen. He  therefore asked that we put  a Unna boot back on the right leg. He will also contact the wound care center in [facility] to see if they can change his dressing in the mid week. 11/3; patient returned from his vacation to [facility]. He was seen on 1 occasion at their wound care center. They did a 2 layer compression system as they did not have our 4-layer wrap. I am not completely certain what they put on the wounds. They did not change the Unna boot on the right. The patient is also seeing a lymphedema specialist physical therapist in [place]. It appears that he has some compression sleeve for his thighs which indeed look quite a bit better than I am used to seeing. He pumps over these with his external compression pumps. 11/10; the patient has a new wound on the right medial thigh otherwise there is no open areas on the right. He has an area on the left leg posteriorly anteriorly and medially and an area over the left second toe. We have been using silver alginate. He thinks the injury on his thigh is secondary to friction from the compression sleeve he has. 11/17; the patient has a new wound on the right medial thigh last week. He thinks this is because he did not have a underlying stocking for his thigh juxta lite apparatus. He now has this. The area is fairly large and somewhat angry but I do not think he has underlying cellulitis. He has a intact blister on the right anterior tibial area. Small wound on the right great toe dorsally Small area on the medial left calf. 11/30; the patient does not have any open areas on his right leg and we did not take his juxta lite stocking off. However he states that on Friday his compression wrap fell down lodging around his upper mid calf area. As usual this creates a lot of problems for him. He called urgently today to be seen for a nurse visit however the nurse visit turned into a provider visit because of extreme erythema and pain in the left anterior tibia extending  laterally and posteriorly. The area that is problematic is extensive 10/06/2019 upon evaluation today patient actually appears to be doing poorly in regard to his left lower extremity. He Dr. Leanord Hawking did place him on doxycycline this past Monday apparently due to the fact that he was doing much worse in regard to this left leg. Fortunately the doxycycline does seem to be helping. Unfortunately we are still having a very difficult time getting his edema under any type of control in order to anticipate discharge at some point. The only way were really able to control his lymphedema really is with compression wraps and that has only even seemingly temporary. He has been seeing a lymphedema clinic they are trying to help in this regard but still this has been somewhat frustrating in general for the patient. 10/13/19 on evaluation today patient appears to be doing excellent with regard to his right lower extremity as far as the wounds are concerned. His swelling is still quite extensive unfortunately. He is still having a lot of drainage from the thigh areas bilaterally which is unfortunate. He's been going to lymphedema clinic but again he still really does not have this edema under control as far as his lower extremities are concern. With regard to his left lower extremity this seems to be improving and I do believe the doxycycline has been of benefit for him.  He is about to complete the doxycycline. 10/20/2019 on evaluation today patient appears to be doing poorly in regard to his bilateral lower extremities. More in the right thigh he has a lot of irritation at this site unfortunately. In regard to the left lower extremity the wrap was not quite as high it appears and does seem to have caused him some trouble as well. Fortunately there is no evidence of systemic infection though he does have some blue-green drainage which has me concerned for the possibility of Pseudomonas. He tells me he is  previously taking Cipro without complications and he really does not care for Levaquin however due to some of the side effects he has. He is not allergic to any medications specifically antibiotics that were aware of. 10/27/2019 on evaluation today patient actually does appear to be for the most part doing better when compared to last week's evaluation. With that being said he still has multiple open wounds over the bilateral lower extremities. He actually forgot to start taking the Cipro and states that he still has the whole bottle. He does have several new blisters on left lower extremity today I think I would recommend he go ahead and take the Cipro based on what I am seeing at this point. 12/30-Patient comes at 1 week visit, 4 layer compression wraps on the left and Unna boot on the right, primary dressing Xtrasorb and silver alginate. Patient is taking his Cipro and has a few more days left probably 5-6, and the legs are doing better. He states he is using his compressions devices which I believe he has 11/10/2019 on evaluation today patient actually appears to be much better than last time I saw him 2 weeks ago. His wounds are significantly improved and overall I am very pleased in this regard. Fortunately there is no signs of active infection at this time. He is just a couple of days away from completing Cipro. Overall his edema is much better he has been using his lymphedema pumps which I think is also helping at this point. Electronic Signature(s) Signed: 11/10/2019 11:33:24 AM By: Kevin Kelp PA-C Entered By: [name] on 11/10/2019 11:33:24 -------------------------------------------------------------------------------- Physical Exam Details Patient Name: Date of Service: Kevin, [name] 11/10/2019 10:00 AM Medical Record [id number] Patient Account Number: [id number] Date of Birth/Sex: Treating RN: Jul 23, 1951 (69 y.o. M) Primary Care Provider: Nicoletta [name] Other  Clinician: Referring Provider: Treating Provider/Extender:[name] Weeks in Treatment: 198 Constitutional Well-nourished and well-hydrated in no acute distress. Respiratory normal breathing without difficulty. Psychiatric this patient is able to make decisions and demonstrates good insight into disease process. Alert and Oriented x 3. pleasant and cooperative. Notes Patient's wound bed currently showed signs of good granulation at this time. Fortunately there is no signs of active infection which is good news. No fevers, chills, nausea, vomiting, or diarrhea. No sharp debridement was necessary at any location today. Electronic Signature(s) Signed: 11/10/2019 11:33:43 AM By: Kevin Kelp PA-C Entered By: [name] on 11/10/2019 11:33:42 -------------------------------------------------------------------------------- Physician Orders Details Patient Name: Date of Service: Kevin, [name] 11/10/2019 10:00 AM Medical Record [id number] Patient Account Number: [id number] Date of Birth/Sex: Treating RN: [birth year] (69 y.o. 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 Dressing Change Frequency Do not change entire dressing for one  week. - both lower legs Skin Barriers/Peri-Wound Care TCA Cream or Ointment - to legs Wound Cleansing May shower with protection. Primary Wound Dressing Wound #145R Right,Medial Upper Leg Calcium Alginate with Silver Wound #151 Left,Lateral Lower Leg Calcium Alginate with Silver Wound #154 Left,Medial Lower Leg Calcium Alginate with Silver Wound #155 Right,Distal,Medial Lower Leg Calcium Alginate with Silver Secondary Dressing Wound #145R Right,Medial Upper Leg ABD pad Other: - zetuvit as needed Wound #151 Left,Lateral Lower Leg ABD pad Other: - zetuvit as needed Wound #154 Left,Medial Lower Leg ABD pad Other: - zetuvit as needed Wound #155 Right,Distal,Medial Lower Leg ABD pad Other: - zetuvit as needed Edema Control 4 layer compression: Left lower extremity - unna boot at upper portion of lower leg. Unna Boot to Right Lower Extremity Avoid standing for long periods of time Elevate legs to the level of the heart or above for 30 minutes daily and/or when sitting, a frequency of: - throughout the day Exercise regularly Segmental Compressive Device. - lymphadema pumps 60 min 2 times per day Other: - lymphadema wraps to upper leg per [facility] clinic Electronic Signature(s) Signed: 11/10/2019 6:06:10 PM By: Zenaida Deed RN, BSN Signed: 11/15/2019 4:31:37 PM By: Kevin Kelp PA-C Entered By: Zenaida Deed on 11/10/2019 11:30:09 -------------------------------------------------------------------------------- Problem List Details Patient Name: Date of Service: [name]. 11/10/2019 10:00 AM Medical Record [id number] Patient Account Number: [id number] Date of Birth/Sex: Treating RN: 19-Oct-1951 (69 y.o. M) Primary Care Provider: Nicoletta [name] Other Clinician: Referring Provider: Treating Provider/Extender:[name] Weeks in Treatment: 198 Active Problems ICD-10 Evaluated Encounter Code Description Active Date Today  Diagnosis L97.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 right calf limited to 06/30/2018 No Yes breakdown of skin L97.221 Non-pressure chronic ulcer of left calf limited to 09/30/2016 No Yes breakdown of skin I87.333 Chronic venous hypertension (idiopathic) with ulcer 01/22/2016 No Yes and inflammation of bilateral lower extremity I89.0 Lymphedema, not elsewhere classified 01/22/2016 No Yes E11.622 Type 2 diabetes mellitus with other skin ulcer 01/22/2016 No Yes 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, 01/22/2016 No Yes unspecified L03.116 Cellulitis of left lower limb 04/01/2017 No Yes Inactive Problems ICD-10 Code Description Active Date Inactive Date 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 06/30/2017 skin L97.521 Non-pressure chronic ulcer of other part of left foot limited to 04/27/2018 04/27/2018 breakdown of skin L03.115 Cellulitis of right lower limb 12/22/2017 12/22/2017 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 with other specified 06/30/2018 06/30/2018 severity L97.511 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 06/30/2018 06/30/2018 breakdown of skin Resolved Problems Electronic Signature(s) Signed: 11/10/2019 10:30:31 AM By: Kevin Kelp PA-C Entered By: [name] on 11/10/2019 10:30:27 -------------------------------------------------------------------------------- Progress Note Details Patient Name: Date of Service: [name]. 11/10/2019 10:00 AM Medical Record [id number] Patient Account Number: [id number] Date of Birth/Sex: Treating RN: [birth year] (69 y.o. M) Primary Care Provider: Nicoletta [name] Other Clinician: Referring Provider: Treating Provider/Extender:[name] Weeks in Treatment: 198 Subjective Chief Complaint Information obtained from Patient patient is here for evaluation venous/lymphedema weeping History of Present Illness (HPI) Referred  by  PCP for consultation. Patient has long standing history of BLE venous stasis, no prior ulcerations. At beginning of month, developed cellulitis and weeping. Received IM Rocephin followed by Keflex and resolved. Wears compression stocking, appr 6 months old. Not sure strength. No present drainage. 01/22/16 this is a patient who is a type II diabetic on insulin. 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 He tells me he religiously wears pressure stockings of uncertain strength. He was here with weeping edema about 8 months ago but did not have an open wound. Roughly a month ago he had a reopening on his bilateral legs. He is been using bandages and Neosporin. He does not complain of pain. 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 Last BUN/creatinine I have is from 11/20/15 at 13 and 1.0 respectively 01/29/16; patient arrives today having tolerated the Profore wrap. He brought in his stockings and these are 18 mmHg stockings he bought from [place]. The compression here is likely inadequate. He does not complain of pain or excessive drainage she has no systemic symptoms. The wound on the right looks improved as does the one on the left although one on the left is more substantial with still tissue at risk below the actual wound area on the bilateral posterior calf 02/05/16; patient arrives with poor edema control. He states that we did put a 4 layer compression on it last week. No weight appear 5 this. 02/12/16; the area on the posterior right Has healed. The left Has a substantial wound that has necrotic surface eschar that requires a debridement with a curette. 02/16/16;the patient called or a Nurse visit secondary to increased swelling. He had been in earlier in the week with his right leg healed. He was transitioned to is on pressure stocking on the right leg with  the only open wound on the left, a substantial area on the left posterior calf. Note he has a history of severe lower extremity edema, he has a history of chronic atrial fibrillation but not heart failure per my notes but I'll need to research this. He is not complaining of chest pain shortness of breath or orthopnea. The intake nurse noted blisters on the previously closed right leg 02/19/16; this is the patient's regular visit day. I see him on Friday with escalating edema new wounds on the right leg and clear signs of at least right ventricular heart failure. I increased his Lasix to 40 twice a day. He is returning currently in follow-up. States he is noticed a decrease in that the edema 02/26/16 patient's legs have much less edema. There is nothing really open on the right leg. The left leg has improved condition of the large superficial wound on the posterior left leg 03/04/16; edema control is very much better. The patient's right leg wounds have healed. On the left leg he continues to have severe venous inflammation on the posterior aspect of the left leg. There is no tenderness and I don't think any of this is cellulitis. 03/11/16; patient's right leg is married healed and he is in his own stocking. The patient's left leg has deteriorated somewhat. There is a lot of erythema around the wound on the posterior left leg. There is also a significant rim of erythema posteriorly just above where the wrap would've ended there is a new wound in this location and a lot of  tenderness. Can't rule out cellulitis in this area. 03/15/16; patient's right leg remains healed and he is in his own stocking. The patient's left leg is much better than last review. His major wound on the posterior aspect of his left Is almost fully epithelialized. He has 3 small injuries from the wraps. Really. Erythema seems a lot better on antibiotics 03/18/16; right leg remains healed and he is in his own stocking. The patient's  left leg is much better. The area on the posterior aspect of the left calf is fully epithelialized. His 3 small injuries which were wrap injuries on the left are improved only one seems still open his erythema has resolved 03/25/16; patient's right leg remains healed and he is in his own stocking. There is no open area today on the left leg posterior leg is completely closed up. His wrap injuries at the superior aspect of his leg are also resolved. He looks as though he has some irritation on the dorsal ankle but this is fully epithelialized without evidence of infection. 03/28/16; we discharged this patient on Monday. Transitioned him into his own stocking. There were problems almost immediately with uncontrolled swelling weeping edema multiple some of which have opened. He does not feel systemically unwell in particular no chest pain no shortness of breath and he does not feel 04/08/16; the edema is under better control with the Profore light wrap but he still has pitting edema. There is one large wound anteriorly 2 on the medial aspect of his left leg and 3 small areas on the superior posterior calf. Drainage is not excessive he is tolerating a Profore light well 04/15/16; put a Profore wrap on him last week. This is controlled is edema however he had a lot of pain on his left anterior foot most of his wounds are healed 04/22/16 once again the patient has denuded areas on the left anterior foot which he states are because his wrap slips up word. He saw his primary physician today is on Lasix 40 twice a day and states that he his weight is down 20 pounds over the last 3 months. 04/29/16: Much improved. left anterior foot much improved. He is now on Lasix 80 mg per day. Much improved edema control 05/06/16; I was hoping to be able to discharge him today however once again he has blisters at a low level of where the compression was placed last week mostly on his left lateral but also his left medial leg  and a small area on the anterior part of the left foot. 05/09/16; apparently the patient went home after his appointment on 7/4 later in the evening developing pain in his upper medial thigh together with subjective fever and chills although his temperature was not taken. The pain was so intense he felt he would probably have to call 911. However he then remembered that he had leftover doxycycline from a previous round of antibiotics and took these. By the next morning he felt a lot better. He called and spoke to one of our nurses and I approved doxycycline over the phone thinking that this was in relation to the wounds we had previously seen although they were definitely were not. The patient feels a lot better old fever no chills he is still working. Blood sugars are reasonably controlled 05/13/16; patient is back in for review of his cellulitis on his anterior medial upper thigh. He is taking doxycycline this is a lot better. Culture I did of the nodular area on  the dorsal aspect of his foot grew MRSA this also looks a lot better. 05/20/16; the patient is cellulitis on the medial upper thigh has resolved. All of his wound areas including the left anterior foot, areas on the medial aspect of the left calf and the lateral aspect of the calf at all resolved. He has a new blister on the left dorsal foot at the level of the fourth toe this was excised. No evidence of infection 05/27/16; patient continues to complain weeping edema. He has new blisterlike wounds on the left anterior lateral and posterior lateral calf at the top of his wrap levels. The area on his left anterior foot appears better. He is not complaining of fever, pain or pruritus in his feet. 05/30/16; the patient's blisters on his left anterior leg posterior calf all look improved. He did not increase the Lasix 100 mg as I suggested because he was going to run out of his 40 mg tablets. He is still having weeping edema of his toes 06/03/16;  I renewed his Lasix at 80 mg once a day as he was about to run out when I last saw him. He is on 80 mg of Lasix now. I have asked him to cut down on the excessive amount of water he was drinking and asked him to drink according to his thirst mechanisms 06/12/2016 -- was seen 2 days ago and was supposed to wear his compression stockings at home but he is developed lymphedema and superficial blisters on the left lower extremity and hence came in for a review 06/24/16; the remaining wound is on his left anterior leg. He still has edema coming from between his toes. There is lymphedema here however his edema is generally better than when I last saw this. He has a history of atrial fibrillation but does not have a known history of congestive heart failure nevertheless I think he probably has this at least on a diastolic basis. 07/01/16 I reviewed his echocardiogram from January 2017. This was essentially normal. He did not have LVH, EF of 55-60%. His right ventricular function was normal although he did have trivial tricuspid and pulmonic regurgitation. This is not audible on exam however. I increased his Lasix to do massive edema in his legs well above his knees I think in early July. He was also drinking an excessive amount of water at the time. 07/15/16; missed his appointment last week because of the Labor Day holiday on Monday. He could not get another appointment later in the week. Started to feel the wrap digging in superiorly so we remove the top half and the bottom half of his wrap. He has extensive erythema and blistering superiorly in the left leg. Very tender. Very swollen. Edema in his foot with leaking edema fluid. He has not been systemically unwell 07/22/16; the area on the left leg laterally required some debridement. The medial wounds look more stable. His wrap injury wounds appear to have healed. Edema and his foot is better, weeping edema is also better. He tells me he is meeting with  the supplier of the external compression pumps at work 08/05/16; the patient was on vacation last week in [facility]. His wrap is been on for an extended period of time. Also over the weekend he developed an extensive area of tender erythema across his anterior medial thigh. He took to doxycycline yesterday that he had leftover from a previous prescription. The patient complains of weeping edema coming out of his toes 08/08/16;  I saw this patient on 10/2. He was tender across his anterior thigh. I put him on doxycycline. He returns today in follow-up. He does not have any open wounds on his lower leg, he still has edema weeping into his toes. 08/12/16; patient was seen back urgently today to follow-up for his extensive left thigh cellulitis/erysipelas. He comes back with a lot less swelling and erythema pain is much better. I believe I gave him Augmentin and Cipro. His wrap was cut down as he stated a roll down his legs. He developed blistering above the level of the wrap that remained. He has 2 open blisters and 1 intact. 08/19/16; patient is been doing his primary doctor who is increased his Lasix from 40-80 once a day or 80 already has less edema. Cellulitis has remained improved in the left thigh. 2 open areas on the posterior left calf 08/26/16; he returns today having new open blisters on the anterior part of his left leg. He has his compression pumps but is not yet been shown how to use some vital representative from the supplier. 09/02/16 patient returns today with no open wounds on the left leg. Some maceration in his plantar toes 09/10/2016 -- Dr. Leanord Hawking had recently discharged him on 09/02/2016 and he has come right back with redness swelling and some open ulcers on his left lower extremity. He says this was caused by trying to apply his compression stockings and he's been unable to use this and has not been able to use his lymphedema pumps. He had some doxycycline leftover and he has  started on this a few days ago. 09/16/16; there are no open wounds on his leg on the left and no evidence of cellulitis. 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 He does not complain of symptoms here. I am not clear use using his external compression pumps. 09/23/16; I have not seen this patient in 2 weeks. He canceled his appointment 10 days ago as he was going on vacation. He tells me that on Monday he noticed a large area on his posterior left leg which is been draining copiously and is reopened into a large wound. He is been using ABDs and the external part of his juxtalite, according to our nurse this was not on properly. 10/07/16; Still a substantial area on the posterior left leg. Using silver alginate 10/14/16; in general better although there is still open area which looks healthy. Still using silver alginate. He reminds me that this happen before he left for [facility]. Today while he was showering in the morning. He had been using his juxtalite's 10/21/16; the area on his posterior left leg is fully epithelialized. However he arrives today with a large area of tender erythema in his medial and posterior left thigh just above the knee. I have marked the area. Once again he is reluctant to consider hospitalization. I treated him with oral antibiotics in the past for a similar situation with resolution I think with doxycycline however this area it seems more extensive to me. He is not complaining of fever but does have chills and says states he is thirsty. His blood sugar today was in the 140s at home 10/25/16 the area on his posterior left leg is fully epithelialized although there is still some weeping edema. The large area of tenderness and erythema in his medial and posterior left thigh is a lot less tender although there is still a lot of  swelling in this thigh. He states he feels a lot better. He is on doxycycline and  Augmentin that I started last week. This will continued until Tuesday, December 26. I have ordered a duplex ultrasound of the left thigh rule out DVT whether there is an abscess something that would need to be drained I would also like to know. 11/01/16; he still has weeping edema from a not fully epithelialized area on his left posterior calf. Most of the rest of this looks a lot better. He has completed his antibiotics. His thigh is a lot better. Duplex ultrasound did not show a DVT in the thigh 11/08/16; he comes in today with more Denuded surface epithelium from the posterior aspect of his calf. There is no real evidence of cellulitis. The superior aspect of his wrap appears to have put quite an indentation in his leg just below the knee and this may have contributed. He does not complain of pain or fever. We have been using silver alginate as the primary dressing. The area of cellulitis in the right thigh has totally resolved. He has been using his compression stockings once a week 11/15/16; the patient arrives today with more loss of epithelium from the posterior aspect of his left calf. He now has a fairly substantial wound in this area. The reason behind this deterioration isn't exactly clear although his edema is not well controlled. He states he feels he is generally more swollen systemically. He is not complaining of chest pain shortness of breath fever. Tells me he has an appointment with his primary physician in early February. He is on 80 mg of oral Lasix a day. He claims compliance with the external compression pumps. He is not having any pain in his legs similar to what he has with his recurrent cellulitis 11/22/16; the patient arrives a follow-up of his large area on his left lateral calf. This looks somewhat better today. He came in earlier in the week for a dressing change since I saw him a week ago. He is not complaining of any pain no shortness of breath no chest pain 11/28/16;  the patient arrives for follow-up of his large area on the left lateral calf this does not look better. In fact it is larger weeping edema. The surface of the wound does not look too bad. We have been using silver alginate although I'm not certain that this is a dressing issue. 12/05/16; again the patient follows up for a large wound on the left lateral and left posterior calf this does not look better. There continues to be weeping edema necrotic surface tissue. More worrisome than this once again there is erythema below the wound involving the distal Achilles and heel suggestive of cellulitis. He is on his feet working most of the day of this is not going well. We are changing his dressing twice a week to facilitate the drainage. 12/12/16; not much change in the overall dimensions of the large area on the left posterior calf. This is very inflamed looking. I gave him an. Doxycycline last week does not really seem to have helped. He found the wrap very painful indeed it seems to of dog into his legs superiorly and perhaps around the heel. He came in early today because the drainage had soaked through his dressings. 12/19/16- patient arrives for follow-up evaluation of his left lower extremity ulcers. He states that he is using his lymphedema pumps once daily when there is "no drainage". He admits to not using  his lipedema pumps while under current treatment. His blood sugars have been consistently between 150-200. 12/26/16; the patient is not using his compression pumps at home because of the wetness on his feet. I've advised him that I think it's important for him to use this daily. He finds his feet too wet, he can put a plastic bag over his legs while he is in the pumps. Otherwise I think will be in a vicious circle. We are using silver alginate to the major area on his left posterior calf 01/02/17; the patient's posterior left leg has further of all into 3 open wounds. All of them covered with a  necrotic surface. He claims to be using his compression pumps once a day. His edema control is marginal. Continue with silver alginate 01/10/17; the patient's left posterior leg actually looks somewhat better. There is less edema, less erythema. Still has 3 open areas covered with a necrotic surface requiring debridement. He claims to be using his compression pumps once a day his edema control is better 01/17/17; the patient's left posterior calf look better last week when I saw him and his wrap was changed 2 days ago. He has noted increasing pain in the left heel and arrives today with much larger wounds extensive erythema extending down into the entire heel area especially tender medially. He is not systemically unwell CBGs have been controlled no fever. Our intake nurse showed me limegreen drainage on his AVD pads. 01/24/17; his usual this patient responds nicely to antibiotics last week giving him Levaquin for presumed Pseudomonas. The whole entire posterior part of his leg is much better much less inflamed and in the case of his Achilles heel area much less tender. He has also had some epithelialization posteriorly there are still open areas here and still draining but overall considerably better 01/31/17- He has continue to tolerate the compression wraps. he states that he continues to use the lymphedema pumps daily, and can increase to twice daily on the weekends. He is voicing no complaints or concerns regarding his LLE ulcers 02/07/17-he is here for follow-up evaluation. He states that he noted some erythema to the left medial and anterior thigh, which he states is new as of yesterday. He is concerned about recurrent cellulitis. He states his blood sugars have been slightly elevated, this morning in the 180s 02/14/17; he is here for follow-up evaluation. When he was last here there was erythema superiorly from his posterior wound in his anterior thigh. He was prescribed Levaquin however a  culture of the wound surface grew MRSA over the phone I changed him to doxycycline on Monday and things seem to be a lot better. 02/24/17; patient missed his appointment on Friday therefore we changed his nurse visit into a physician visit today. Still using silver alginate on the large area of the posterior left thigh. He isn't new area on the dorsal left second toe 03/03/17; actually better today although he admits he has not used his external compression pumps in the last 2 days or so because of work responsibilities over the weekend. 03/10/17; continued improvement. External compression pumps once a day almost all of his wounds have closed on the posterior left calf. Better edema control 03/17/17; in general improved. He still has 3 small open areas on the lateral aspect of his left leg however most of the area on the posterior part of his leg is epithelialized. He has better edema control. He has an ABD pad under his stocking on the right  anterior lower leg although he did not let us look at that today. 03/24/17; patient arrives back in clinic today with no open areas however there are areas on the posterior left calf and anterior left calf that are less than 100% epithelialized. His edema is well controlled in the left lower leg. There is some pitting edema probably lymphedema in the left upper thigh. He uses compression pumps at home once per day. I tried to get him to do this twice a day although he is very reticent. 04/01/2017 -- for the last 2 days he's had significant redness, tenderness and weeping and came in for an urgent visit today. 04/07/17; patient still has 6 more days of doxycycline. He was seen by Dr. Meyer Russel last Wednesday for cellulitis involving the posterior aspect, lateral aspect of his Involving his heel. For the most part he is better there is less erythema and less weeping. He has been on his feet for 12 hours o2 over the weekend. Using his compression pumps once a  day 04/14/17 arrives today with continued improvement. Only one area on the posterior left calf that is not fully epithelialized. He has intense bilateral venous inflammation associated with his chronic venous insufficiency disease and secondary lymphedema. We have been using silver alginate to the left posterior calf wound In passing he tells [place] today that the right leg but we have not seen in quite some time has an open area on it but he doesn't want [place] to look at this today states he will show this to [place] next week. 04/21/17; there is no open area on his left leg although he still reports some weeping edema. He showed [place] his right leg today which is the first time we've seen this leg in a long time. He has a large area of open wound on the right leg anteriorly healthy granulation. Quite a bit of swelling in the right leg and some degree of venous inflammation. He told us about the right leg in passing last week but states that deterioration in the right leg really only happened over the weekend 04/28/17; there is no open area on the left leg although there is an irritated part on the posterior which is like a wrap injury. The wound on the right leg which was new from last week at least to [place] is a lot better. 05/05/17; still no open area on the left leg. Patient is using his new compression stocking which seems to be doing a good job of controlling the edema. He states he is using his compression pumps once per day. The right leg still has an open wound although it is better in terms of surface area. Required debridement. A lot of pain in the posterior right Achilles marked tenderness. Usually this type of presentation this patient gives concern for an active cellulitis 05/12/17; patient arrives today with his major wound from last week on the right lateral leg somewhat better. Still requiring debridement. He was using his compression stocking on the left leg however that is reopened with superficial  wounds anteriorly he did not have an open wound on this leg previously. He is still using his juxta light's once daily at night. He cannot find the time to do this in the morning as he has to be at work by 7 AM 05/19/17; right lateral leg wound looks improved. No debridement required. The concerning area is on the left posterior leg which appears to almost have a subcutaneous hemorrhagic component to it.  We've been using silver alginate to all the wounds 05/26/17; the right lateral leg wound continues to look improved. However the area on the left posterior calf is a tightly adherent surface. [place] using silver alginate. Because of the weeping edema in his legs there is very little good alternatives. 06/02/17; the patient left here last week looking quite good. Major wound on the left posterior calf and a small one on the right lateral calf. Both of these look satisfactory. He tells me that by Wednesday he had noted increased pain in the left leg and drainage. He called on Thursday and Friday to get an appointment here but we were blocked. He did not go to urgent care or his primary physician. He thinks he had a fever on Thursday but did not actually take his temperature. He has not been using his compression pumps on the left leg because of pain. I advised him to go to the emergency room today for IV antibiotics for stents of left leg cellulitis but he has refused I have asked him to take 2 days off work to keep his leg elevated and he has refused this as well. In view of this I'm going to call him and Augmentin and doxycycline. 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 However after a detailed discussion regarding the risk benefits and alternatives he  refuses to get admitted to the hospital. With no other recourse I will continue him on oral antibiotics as before and hopefully he'll have his infectious disease consultation this week. 06/16/2017 -- the patient was seen today by the nurse practitioner at infectious disease Ms. Dixon. 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 He has also been advise Lotrimin cream locally. she also had wise IV antibiotics for his condition if it flares up 06/23/17; patient arrives today with drainage bilaterally although the remaining wound on the left posterior calf after cleaning up today "highlighter yellow drainage" did not look too bad. Unfortunately he has had breakdown on the right anterior leg [previously this leg had not been open and he is using a black stocking] he went to see infectious disease and is been put on clindamycin 150 mg daily, I did not verify the dose although I'm not familiar with using clindamycin in this dosing range, perhaps for prophylaxisoo 06/27/17; I brought this patient back today to follow-up on the wound deterioration on the right lower leg together with surrounding cellulitis. I started him on doxycycline 4 days ago. This area looks better however he comes in today with intense cellulitis on the medial part of his left thigh. This is not have a wound in this area. Extremely tender. We've been using silver alginate to the wounds on the right lower leg left lower leg with bilateral 4 layer compression he is using his external compression pumps once a day 07/04/17; patient's left medial thigh cellulitis looks better. He has not been using his compression pumps as his insert said it was contraindicated with cellulitis. His right leg continues to make improvements all the wounds are still open. We  only  have one remaining wound on the left posterior calf. Using silver alginate to all open areas. He is on doxycycline which I started a week ago and should be finishing I gave him Augmentin after Thursday's visit for the severe cellulitis on the left medial thigh which fortunately looks better 07/14/17; the patient's left medial thigh cellulitis has resolved. The cellulitis in his right lower calf on the right also looks better. All of his wounds are stable to improved we've been using silver alginate he has completed the antibiotics I have given him. He has clindamycin 150 mg once a day prescribed by infectious disease for prophylaxis, I've advised him to start this now. We have been using bilateral Unna boots over silver alginate to the wound areas 07/21/17; the patient is been to see infectious disease who noted his recurrent problems with cellulitis. He was not able to tolerate prophylactic clindamycin therefore he is on amoxicillin 500 twice a day. He also had a second daily dose of Lasix added By Dr. Melford Aase but he is not taking this. Nor is he being completely compliant with his compression pumps a especially not this week. He has 2 remaining wounds one on the right posterior lateral lower leg and one on the left posterior medial lower leg. 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 The patient has Unna boots bilaterally. 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 He has bilateral Unna boots for compression and silver alginate to his wounds. Arrives today with his legs looking as good as I have seen him in quite some time. Not surprisingly his wounds look better as well with improvement on the right lateral leg venous insufficiency wound and also the left medial leg. He is  still using the compression pumps once a day 08/11/17; both legs appear to be doing better wounds on the right lateral and left medial legs look better. Skin on the right leg quite good. He is been using silver alginate as the primary dressing. I'm going to use Anasept gel calcium alginate and maintain all the secondary dressings 08/18/17; the patient continues to actually do quite well. The area on his right lateral leg is just about closed the left medial also looks better although it is still moist in this area. His edema is well controlled we have been using Anasept gel with calcium alginate and the usual secondary dressings, 4 layer compression and once daily use of his compression pumps "always been able to manage 09/01/17; the patient continues to do reasonably well in spite of his trip to [place]. The area on the right lateral leg is epithelialized. Left is much better but still open. He has more edema and more chronic erythema on the left leg [venous inflammation] 09/08/17; he arrives today with no open wound on the right lateral leg and decently controlled edema. Unfortunately his left leg is not nearly as in his good situation as last week.he apparently had increasing edema starting on Saturday. He edema soaked through into his foot so used a plastic bag to walk around his home. The area on the medial right leg which was his open area is about the same however he has lost surface epithelium on the left lateral which is new and he has significant pain in the Achilles area  of the left foot. He is already on amoxicillin chronically for prophylaxis of cellulitis in the left leg 09/15/17; he is completed a week of doxycycline and the cellulitis in the left posterior leg and Achilles area is as usual improved. He still has a lot of edema and fluid soaking through his dressings. There is no open wound on the right leg. He saw infectious disease NP today 09/22/17;As usual 1 we transition him  from our compression wraps to his stockings things did not go well. He has several small open areas on the right leg. He states this was caused by the compression wrap on his skin although he did not wear this with the stockings over them. He has several superficial areas on the left leg medially laterally posteriorly. He does not have any evidence of active cellulitis especially involving the left Achilles The patient is traveling from [facility] Saturday going to [facility]. He states he isn't attempting to get an appointment with a heel objects wound center there to change his dressings. I am not completely certain whether this will work 10/06/17; the patient came in on Friday for a nurse visit and the nurse reported that his legs actually look quite good. He arrives in clinic today for his regular follow-up visit. He has a new wound on his left third toe over the PIP probably caused by friction with his footwear. He has small areas on the left leg and a very superficial but epithelialized area on the right anterior lateral lower leg. Other than that his legs look as good as I've seen him in quite some time. We have been using silver alginate Review of systems; no chest pain no shortness of breath other than this a 10 point review of systems negative 10/20/17; seen by Dr. Meyer Russel last week. He had taken some antibiotics [doxycycline] that he had left over. Dr. Meyer Russel thought he had candida infection and declined to give him further antibiotics. He has a small wound remaining on the right lateral leg several areas on the left leg including a larger area on the left posterior several left medial and anterior and a small wound on the left lateral. The area on the left dorsal third toe looks a lot better. ROS; Gen.; no fever, respiratory no cough no sputum Cardiac no chest pain other than this 10 point review of system is negative 10/30/17; patient arrives today having [name] in the bathtub 3 days  ago. It took him a while to get up. He has pain and maceration in the wounds on his left leg which have deteriorated. He has not been using his pumps he also has some maceration on the right lateral leg. 11/03/17; patient continues to have weeping edema especially in the left leg. This saturates his dressings which were just put on on 12/27. As usual the doxycycline seems to take care of the cellulitis on his lower leg. He is not complaining of fever, chills, or other systemic symptoms. He states his leg feels a lot better on the doxycycline I gave him empirically. He also apparently gets injections at his primary doctor's officeo Rocephin for cellulitis prophylaxis. I didn't ask him about his compression pump compliance today I think that's probably marginal. Arrives in the clinic with all of his dressings primary and secondary macerated full of fluid and he has bilateral edema 11/10/17; the patient's right leg looks some better although there is still a cluster of wounds on the right lateral. The left leg is inflamed  with almost circumferential skin loss medially to laterally although we are still maintaining anteriorly. He does not have overt cellulitis there is a lot of drainage. He is not using compression pumps. We have been using silver alginate to the wound areas, there are not a lot of options here 11/17/17; the patient's right leg continues to be stable although there is still open wounds, better than last week. The inflammation in the left leg is better. Still loss of surface layer epithelium especially posteriorly. There is no overt cellulitis in the amount of edema and his left leg is really quite good, tells me he is using his compression pumps once a day. 11/24/17; patient's right leg has a small superficial wound laterally this continues to improve. The inflammation in the left leg is still improving however we have continuous surface layer epithelial loss posteriorly. There is no  overt cellulitis in the amount of edema in both legs is really quite good. He states he is using his compression pumps on the left leg once a day for 5 out of 7 days 12/01/17; very small superficial areas on the right lateral leg continue to improve. Edema control in both legs is better today. He has continued loss of surface epithelialization and left posterior calf although I think this is better. We have been using silver alginate with large number of absorptive secondary dressings 4 layer on the left Unna boot on the right at his request. He tells me he is using his compression pumps once a day 12/08/17; he has no open area on the right leg is edema control is good here. ooOn the left leg however he has marked erythema and tenderness breakdown of skin. He has what appears to be a wrap injury just distal to the popliteal fossa. 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 There was swelling erythema and breakdown in the right leg. He has much worse skin breakdown in the left leg as well multiple open areas medially and posteriorly as well as laterally. He tells me he has been using his compression pumps but tells me he feels that the drainage out of his leg is worse when he uses a compression pumps. To be fair to him he is been saying this for a while however I don't know that I have really been listening to this. I wonder if the compression pumps are working properly 12/22/17;. Once again he arrives with severe erythema, weeping edema from the left greater than right leg. Noncompliance with compression pumps. New this visit he is complaining of pain on the lateral aspect of the right leg and the medial aspect of his right  thigh. He apparently saw his cardiologist Dr. [name] who was ordered an echocardiogram area and I think this is a step in the right direction 12/25/17; started his doxycycline Monday night. There is still intense erythema of the right leg especially in the anterior thigh although there is less tenderness. The erythema around the wound on the right lateral calf also is less tender. He still complaining of pain in the left heel. His wounds are about the same right lateral left medial left lateral. Superficial but certainly not close to closure. He denies being systemically unwell no fever  chills no abdominal pain no diarrhea 12/29/17; back in follow-up of his extensive right calf and right thigh cellulitis. I added amoxicillin to cover possible doxycycline resistant strep. This seems to of done the trick he is in much less pain there is much less erythema and swelling. He has his echocardiogram at 11:00 this morning. X-ray of the left heel was also negative. 01/05/18; the patient arrived with his edema under much better control. Now that he is retired he is able to use his compression pumps daily and sometimes twice a day per the patient. He has a wound on the right leg the lateral wound looks better. Area on the left leg also looks a lot better. He has no evidence of cellulitis in his bilateral thighs I had a quick peak at his echocardiogram. He is in normal ejection fraction and normal left ventricular function. He has moderate pulmonary hypertension moderately reduced right ventricular function. One would have to wonder about chronic sleep apnea although he says he doesn't snore. He'll review the echocardiogram with his cardiologist. 01/12/18; the patient arrives with the edema in both legs under exemplary control. He is using his compression pumps daily and sometimes twice daily. His wound on the right lateral leg is just about closed. He still has some weeping areas on the posterior left calf and  lateral left calf although everything is just about closed here as well. I have spoken with Aldean Baker who is the patient's nurse practitioner and infectious disease. She was concerned that the patient had not understood that the parenteral penicillin injections he was receiving for cellulitis prophylaxis was actually benefiting him. I don't think the patient actually saw that I would tend to agree we were certainly dealing with less infections although he had a serious one last month. 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 He is healed. He'll be placed in juxtalite compression wraps and increase his lymphedema pumps to twice daily. We will follow up again next week to ensure there are no issues with the new regiment. 01/20/18-he is here for evaluation of bilateral lower extremity weeping edema. Yesterday he was placed in compression wrap to the right lower extremity and compression stocking to left lower shrubbery. He states he uses lymphedema pumps last night and again this morning and noted a blister to the left lower extremity. On exam he was noted to have drainage to the right lower extremity. He will be placed in Unna boots bilaterally and follow-up next week 01/26/18; patient was actually discharged a week ago to his own juxta light stockings only to return the next day with bilateral lower extremity weeping edema.he was placed in bilateral Unna boots. He arrives today with pain in the back of his left leg. There is no open area on the right leg however there is a linear/wrap injury on the left leg and weeping edema on the left leg posteriorly. I spoke with infectious disease about 10 days ago. They were disappointed that the patient elected to discontinue prophylactic intramuscular penicillin shots as they felt it was particularly beneficial in reducing the frequency of his cellulitis. I discussed this with the patient today. He does not share  this view. He'll definitely need antibiotics today. Finally he is traveling to [place] and trauma leaving this Saturday and returning a week later and he does not travel with his pumps. He is going by car 01/30/18; patient was seen 4 days ago and brought back in today for review  of cellulitis in the left leg posteriorly. I put him on amoxicillin this really hasn't helped as much as I might like. He is also worried because he is traveling to [facility] trauma by car. Finally we will be rewrapping him. There is no open area on the right leg over his left leg has multiple weeping areas as usual 02/09/18; The same wrap on for 10 days. He did not pick up the last doxycycline I prescribed for him. He apparently took 4 days worth he already had. There is nothing open on his right leg and the edema control is really quite good. He's had damage in the left leg medially and laterally especially probably related to the prolonged use of Unna boots 02/12/18; the patient arrived in clinic today for a nurse visit/wrap change. He complained of a lot of pain in the left posterior calf. He is taking doxycycline that I previously prescribed for him. Unfortunately even though he used his stockings and apparently used to compression pumps twice a day he has weeping edema coming out of the lateral part of his right leg. This is coming from the lower anterior lateral skin area. 02/16/18; the patient has finished his doxycycline and will finish the amoxicillin 2 days. The area of cellulitis in the left calf posteriorly has resolved. He is no longer having any pain. He tells me he is using his compression pumps at least once a day sometimes twice. 02/23/18; the patient finished his doxycycline and Amoxil last week. On Friday he noticed a small erythematous circle about the size of a quarter on the left lower leg just above his ankle. This rapidly expanded and he now has erythema on the lateral and posterior part of the thigh.  This is bright red. Also has an area on the dorsal foot just above his toes and a tender area just below the left popliteal fossa. He came off his prophylactic penicillin injections at his own insistence one or 2 months ago. This is obviously deteriorated since then 03/02/18; patient is on doxycycline and Amoxil. Culture I did last week of the weeping area on the back of his left calf grew group B strep. I have therefore renewed the amoxicillin 500 3 times a day for a further week. He has not been systemically unwell. Still complaining of an area of discomfort right under his left popliteal fossa. There is no open wound on the right leg. He tells me that he is using his pumps twice a day on most days 03/09/18; patient arrives in clinic today completing his amoxicillin today. The cellulitis on his left leg is better. Furthermore he tells me that he had intramuscular penicillin shots that his primary care office today. However he also states that the wrap on his right leg fell down shortly after leaving clinic last week. He developed a large blister that was present when he came in for a nurse visit later in the week and then he developed intense discomfort around this area.He tells me he is using his compression pumps 03/16/18; the patient has completed his doxycycline. The infectious part of this/cellulitis in the left heel area left popliteal area is a lot better. He has 2 open areas on the right calf. Still areas on the left calf but this is a lot better as well. 03/24/18; the patient arrives complaining of pain in the left popliteal area again. He thinks some of this is wrap injury. He has no open area on the right leg and really  no open area on the left calf either except for the popliteal area. He claims to be compliant with the compression pumps 03/31/18; I gave him doxycycline last week because of cellulitis in the left popliteal area. This is a lot better although the surface epithelium is  denuded off and response to this. He arrives today with uncontrolled edema in the right calf area as well as a fingernail injury in the right lateral calf. There is only a few open areas on the left 04/06/18; I gave him amoxicillin doxycycline over the last 2 weeks that the amoxicillin should be completing currently. He is not complaining of any pain or systemic symptoms. The only open areas see has is on the right lateral lower leg paradoxically I cannot see anything on the left lower leg. He tells me he is using his compression pumps twice a day on most days. Silver alginate to the wounds that are open under 4 layer compression 04/13/18; he completed antibiotics and has no new complaints. Using his compression pumps. Silver alginate that anything that's opened 04/20/18; he is using his compression pumps religiously. Silver alginate 4 layer compression anything that's opened. He comes in today with no open wounds on the left leg but 3 on the right including a new one posteriorly. He has 2 on the right lateral and one on the right posterior. He likes Unna boots on the right leg for reasons that aren't really clear we had the usual 4 layer compression on the left. It may be necessary to move to the 4 layer compression on the right however for now I left them in the Unna boots 04/27/18; he is using his compression pumps at least once a day. He has still the wounds on the right lateral calf. The area right posteriorly has closed. He does not have an open wound on the left under 4 layer compression however on the dorsal left foot just proximal to the toes and the left third toe 2 small open areas were identified 05/11/18; he has not uses compression pumps. The areas on the right lateral calf have coalesced into one large wound necrotic surface. On the left side he has one small wound anteriorly however the edema is now weeping out of a large part of his left leg. He says he wasn't using his pumps because of  the weeping fluid. I explained to him that this is the time he needs to pump more 05/18/18; patient states he is using his compression pumps twice a day. The area on the right lateral large wound albeit superficial. On the left side he has innumerable number of small new wounds on the left calf particularly laterally but several anteriorly and medially. All these appear to have healthy granulated base these look like the remnants of blisters however they occurred under compression. The patient arrives in clinic today with his legs somewhat better. There is certainly less edema, less multiple open areas on the left calf and the right anterior leg looks somewhat better as well superficial and a little smaller. However he relates pain and erythema over the last 3-4 days in the thigh and I looked at this today. He has not been systemically unwell no fever no chills no change in blood sugar values 05/25/18; comes in today in a better state. The severe cellulitis on his left leg seems better with the Keflex. Not as tender. He has not been systemically unwell ooHard to find an open wound on the left lower leg  using his compression pumps twice a day ooThe confluent wounds on his right lateral calf somewhat better looking. These will ultimately need debridement I didn't do this today. 06/01/18; the severe cellulitis on the left anterior thigh has resolved and he is completed his Keflex. ooThere is no open wound on the left leg however there is a superficial excoriation at the base of the third toe dorsally. Skin on the bottom of his left foot is macerated looking. ooThe left the wounds on the lateral right leg actually looks some better although he did require debridement of the top half of this wound area with an open curet 06/09/18 on evaluation today patient appears to be doing poorly in regard to his right lower extremity in particular this appears to likely be infected he has very thick purulent  discharge along with a bright green tent to the discharge. This makes me concerned about the possibility of pseudomonas. He's also having increased discomfort at this point on evaluation. Fortunately there does not appear to be any evidence of infection spreading to the other location at this time. 06/16/18 on evaluation today patient appears to actually be doing fairly well. His ulcer has actually diminished in size quite significantly at this point which is good news. Nonetheless he still does have some evidence of infection he did see infectious disease this morning before coming here for his appointment. I did review the results of their evaluation and their note today. They did actually have him discontinue the Cipro and initiate treatment with linezolid at this time. He is doing this for the next seven days and they recommended a follow-up in four months with them. He is the keep a log of the need for intermittent antibiotic therapy between now and when he falls back up with infectious disease. This will help them gaze what exactly they need to do to try and help them out. 06/23/18; the patient arrives today with no open wounds on the left leg and left third toe healed. He is been using his compression pumps twice a day. On the right lateral leg he still has a sizable wound but this is a lot better than last time I saw this. 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 Has been using silver alginate under 4 layer compression 06/30/18; the only open wound he has is on the right lateral leg and this looks healthy. No debridement is required. We have been using silver alginate. He does not have an open wound on the left leg. There is apparently some drainage from the dorsal proximal third toe on the left although I see no open wound here. 07/03/18 on evaluation today patient was actually here just  for a nurse visit rapid change. However when he was here on Wednesday for his rat change due to having been healed on the left and then developing blisters we initiated the wrap again knowing that he would be back today for [place] to reevaluate and see were at. Unfortunately he has developed some cellulitis into the proximal portion of his right lower extremity even into the region of his thigh. 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 He was placed on one as what at that point. Nonetheless he is done with that and has been tolerating it well otherwise. Doxycycline which in the past really did not seem to  be effective for him. Nonetheless I think the best option may be for [place] to definitely reinitiate the antibiotics for a longer period of time. 07/07/18; since I last saw this patient a week ago he has had a difficult time. At that point he did not have an open wound on his left leg. We transitioned him into juxta light stockings. He was apparently in the clinic the next day with blisters on the left lateral and left medial lower calf. He also had weeping edema fluid. He was put back into a compression wrap. He was also in the clinic on Friday with intense erythema in his right thigh. Per the patient he was started on Bactrim however that didn't work at all in terms of relieving his pain and swelling. He has taken 3 doxycycline that he had left over from last time and that seems to of helped. He has blistering on the right thigh as well. 07/14/18; the erythema on his right thigh has gotten better with doxycycline that he is finishing. 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 Presumably this cellulitis in the thigh was not related to that although I think this is a bit concerning going forward. He still has an area on the right lateral calf the blister on the right medial calf just below  the knee that was discussed above. On the left 2 small open areas left medial and left lateral. Edema control is adequate. He is using his compression pumps twice a day 07/20/18; continued improvement in the condition of both legs especially the edema in his bilateral thighs. He tells me he is been losing weight through a combination of diet and exercise. He is using his compression pumps twice a day. So overall she made to the remaining wounds 07/27/2018; continued improvement in condition of both legs. His edema is well controlled. The area on the right lateral leg is just about closed he had one blisters show up on the medial left upper calf. We have him in 4 layer compression. He is going on a 10-day trip to [place], [place] and [place]. He will be driving. He wants to wear Unna boots because of the lessening amount of constriction. He will not use compression pumps while he is away 08/05/18 on evaluation today patient actually appears to be doing decently well all things considered in regard to his bilateral lower extremities. The worst ulcer is actually only posterior aspect of his left lower extremity with a four layer compression wrap cut into his leg a couple weeks back. He did have a trip and actually had [employer] for the trip that he is worn since he was last here. Nonetheless he feels like the [employer] actually do better for him his swelling is up a little bit but he also with his trip was not taking his Lasix on a regular set schedule like he was supposed to be. He states that obviously the reason being that he cannot drive and keep going without having to urinate too frequently which makes it difficult. He did not have his pumps with him while he was away either which I think also maybe playing a role here too. 08/13/2018; the patient only has a small open wound on the right lateral calf which is a big improvement in the last month or 2. He also has the area  posteriorly just below the posterior fossa  on the left which I think was a wrap injury from several weeks ago. He has no current evidence of cellulitis. He tells me he is back into his compression pumps twice a day. He also tells me that while he was at the laundromat somebody stole a section of his extremitease stockings 08/20/2018; back in the clinic with a much improved state. He only has small areas on the right lateral mid calf which is just about healed. This was is more substantial area for quite a prolonged period of time. He has a small open area on the left anterior tibia. The area on the posterior calf just below the popliteal fossa is closed today. He is using his compression pumps twice a day 08/28/2018; patient has no open wound on the right leg. He has a smattering of open areas on the calf with some weeping lymphedema. More problematically than that it looks as though his wraps of slipped down in his usual he has very angry upper area of edema just below the right medial knee and on the right lateral calf. He has no open area on his feet. The patient is traveling to [facility] next week. I will send him in an antibiotic. We will continue to wrap the right leg. We ordered extremitease stockings for him last week and I plan to transition the right leg to a stocking when he gets home which will be in 10 days time. As usual he is very reluctant to take his pumps with him when he travels 09/07/2018; patient returns from [facility]. He shows me a picture of his left leg in the mid part of his trip last week with intense fire engine erythema. The picture look bad enough I would have considered sending him to the hospital. Instead he went to the wound care center in [facility]. They did not prescribe him antibiotics but he did take some doxycycline he had leftover from a previous visit. I had given him trimethoprim sulfamethoxazole before he left this did not work according  to the patient. This is resulted in some improvement fortunately. He comes back with a large wound on the left posterior calf. Smaller area on the left anterior tibia. Denuded blisters on the dorsal left foot over his toes. Does not have much in the way of wounds on the right leg although he does have a very tender area on the right posterior area just below the popliteal fossa also suggestive of infection. He promises me he is back on his pumps twice a day 09/15/2018; the intense cellulitis in his left lower calf is a lot better. The wound area on the posterior left calf is also so better. However he has reasonably extensive wounds on the dorsal aspect of his second and third toes and the proximal foot just at the base of the toes. There is nothing open on the right leg 09/22/2018; the patient has excellent edema control in his legs bilaterally. He is using his external compression pumps twice a day. He has no open area on the right leg and only the areas in the left foot dorsally second and third toe area on the left side. He does not have any signs of active cellulitis. 10/06/2018; the patient has good edema control bilaterally. He has no open wound on the right leg. There is a blister in the posterior aspect of his left calf that we had to deal with today. He is using his compression pumps twice a day.  There is no signs of active cellulitis. We have been using silver alginate to the wound areas. He still has vulnerable areas on the base of his left first second toes dorsally He has a his extremities stockings and we are going to transition him today into the stocking on the right leg. He is cautioned that he will need to continue to use the compression pumps twice a day. If he notices uncontrolled edema in the right leg he may need to go to 3 times a day. 10/13/2018; the patient came in for a nurse check on Friday he has a large flaccid blister on the right medial calf just below the knee. We  unroofed this. He has this and a new area underneath the posterior mid calf which was undoubtedly a blister as well. He also has several small areas on the right which is the area we put his extremities stocking on. 10/19/2018; the patient went to see infectious disease this morning I am not sure if that was a routine follow-up in any case the doxycycline I had given him was discontinued and started on linezolid. He has not started this. It is easy to look at his left calf and the inflammation and think this is cellulitis however he is very tender in the tissue just below the popliteal fossa and I have no doubt that there is infection going on here. He states the problem he is having is that with the compression pumps the edema goes down and then starts walking the wrap falls down. We will see if we can adhere this. He has 1 or 2 minuscule open areas on the right still areas that are weeping on the posterior left calf, the base of his left second and third toes 10/26/18; back today in clinic with quite of skin breakdown in his left anterior leg. This may have been infection the area below the popliteal fossa seems a lot better however tremendous epithelial loss on the left anterior mid tibia area over quite inexpensive tissue. He has 2 blisters on the right side but no other open wound here. 10/29/2018; came in urgently to see [place] today and we worked him in for review. He states that the 4 layer compression on the right leg caused pain he had to cut it down to roughly his mid calf this caused swelling above the wrap and he has blisters and skin breakdown today. As a result of the pain he has not been using his pumps. Both legs are a lot more edematous and there is a lot of weeping fluid. 11/02/18; arrives in clinic with continued difficulties in the right leg> left. Leg is swollen and painful. multiple skin blisters and new open areas especially laterally. He has not been using his pumps on the  right leg. 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 He is not systemically unwell. 11/09/2018; the patient claims he is being compliant with his pumps. He is finished the doxycycline I gave him last week. Culture I did of the wound on the right lateral leg showed a few very resistant methicillin staph aureus. This was resistant to doxycycline. Nevertheless he states the pain in the leg is a lot better which makes me wonder if the cultured organism was not really what was causing the problem nevertheless this is a very dangerous organism to be culturing out of any wound. His right leg is still a lot larger than the left. He is using  an Radio broadcast assistant on this area, he blames a 4-layer compression for causing the original skin breakdown which I doubt is true however I cannot talk him out of it. We have been using silver alginate to all of these areas which were initially blisters 11/16/2018; patient is being compliant with his external compression pumps at twice a day. Miraculously he arrives in clinic today with absolutely no open wounds. He has better edema control on the left where he has been using 4 layer compression versus wound of wounds on the right and I pointed this out to him. There is no inflammation in the skin in his lower legs which is also somewhat unusual for him. There is no open wounds on the dorsal left foot. He has extremitease stockings at home and I have asked him to bring these in next week. 11/25/18 patient's lower extremity on examination today on the left appears for the most part to be wound free. He does have an open wound on the lateral aspect of the right lower extremity but this is minimal compared to what I've seen in past. He does request that we go ahead and wrap the left leg as well even though there's nothing open just so hopefully it will not reopen in short order. 1/28; patient has superficial open wounds on the right  lateral calf left anterior calf and left posterior calf. His edema control is adequate. 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 We have been using silver alginate as the primary dressing. He claims compliance with his compression pumps 2/4; patient has superficial open wounds on numerous areas of his left calf and again one on the left dorsal foot. The areas on the right lateral calf have healed. The cellulitis that I gave him doxycycline for last week is also resolved this was mostly on the left anterior calf just below the tibial tuberosity. His edema looks fairly well-controlled. He tells me he went to see his primary doctor today and had blood work ordered 2/11; once again he has several open areas on the left calf left tibial area. Most of these are small and appear to have healthy granulation. He does not have anything open on the right. The edema and control in his thighs is pretty good which is usually a good indication he has been using his pumps as requested. 2/18; he continues to have several small areas on the left calf and left tibial area. Most of these are small healthy granulation. We put him in his stocking on the right leg last week and he arrives with a superficial open area over the right upper tibia and a fairly large area on the right lateral tibia in similar condition. His edema control actually does not look too bad, he claims to be using his compression pumps twice a day 2/25. Continued small areas on the left calf and left tibial area. New areas especially on the right are identified just below the tibial tuberosity and on the right upper tibia itself. There are also areas of weeping edema fluid even without an obvious wound. He does not have a considerable degree of lymphedema but clearly there is more edema here than his skin can handle. He states he is using the pumps twice a day.  We have an Unna boot on the right and 4 layer compression on the left. 3/3; he continues to have an area on the  right lateral calf and right posterior calf just below the popliteal fossa. There is a fair amount of tenderness around the wound on the popliteal fossa but I did not see any evidence of cellulitis, could just be that the wrap came down and rubbed in this area. ooHe does not have an open area on the left leg however there is an area on the left dorsal foot at the base of the third toe ooWe have been using silver alginate to all wound areas 3/10; he did not have an open area on his left leg last time he was here a week ago. Today he arrives with a horizontal wound just below the tibial tuberosity and an area on the left lateral calf. He has intense erythema and tenderness in this area. The area is on the right lateral calf and right posterior calf better than last week. We have been using silver alginate as usual 3/18 - Patient returns with 3 small open areas on left calf, and 1 small open area on right calf, the skin looks ok with no significant erythema, he continues the UNA boot on right and 4 layer compression on left. The right lateral calf wound is closed , the right posterior is small area. we will continue silver alginate to the areas. 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 He is been tolerating the dressing changes without complication. Fortunately she has made excellent improvement in regard to the overall status of his wounds. Unfortunately every time we cease wrapping him he ends up reopening in causing more significant issues at that point. Again I'm unsure of the best direction to take although I think the lymphedema clinic may be appropriate for him. 02/03/19 on evaluation  today patient appears to be doing well in regard to the wounds that we saw him for last week unfortunately he has a new area on the proximal portion of his right medial/posterior lower extremity where the wrap somewhat slowed down and caused swelling and a blister to rub and open. Unfortunately this is the only opening that he has on either leg at this point. 02/17/19 on evaluation today patient's bilateral lower extremities appear to be doing well. He still completely healed in regard to the left lower extremity. In regard to the right lower extremity the area where the wrap and slid down and caused the blister still seems to be slightly open although this is dramatically better than during the last evaluation two weeks ago. I'm very pleased with the way this stands overall. 03/03/19 on evaluation today patient appears to be doing well in regard to his right lower extremity in general although he did have a new blister open this does not appear to be showing any evidence of active infection at this time. Fortunately there's No fevers, chills, nausea, or vomiting noted at this time. Overall I feel like he is making good progress it does feel like that the right leg will we perform the [employer] seems to do with a bit better than three layer wrap on the left which slid down on him. We may switch to doing bilateral in the book wraps. 5/4; I have not seen Mr. Weedon in quite some time. According to our case manager he did not have an open wound on his left leg last week.  He had 1 remaining wound on the right posterior medial calf. He arrives today with multiple openings on the left leg probably were blisters and/or wrap injuries from Unna boots. I do not think the Unna boot's will provide adequate compression on the left. I am also not clear about the frequency he is using the compression pumps. 03/17/19 on evaluation today patient appears to be doing excellent in regard to his lower extremities compared  to last week's evaluation apparently. He had gotten significantly worse last week which is unfortunate. The [employer] wrap on the left did not seem to do very well for him at all and in fact it didn't control his swelling significantly enough he had an additional outbreak. Subsequently we go back to the four layer compression wrap on the left. This is good news. At least in that he is doing better and the wound seem to be killing him. He still has not heard anything from the lymphedema clinic. 03/24/19 on evaluation today patient actually appears to be doing much better in regard to his bilateral lower Trinity as compared to last week when I saw him. Fortunately there's no signs of active infection at this time. He has been tolerating the dressing changes without complication. Overall I'm extremely pleased with the progress and appearance in general. 04/07/19 on evaluation today patient appears to be doing well in regard to his bilateral lower extremities. His swelling is significantly down from where it was previous. With that being said he does have a couple blisters still open at this point but fortunately nothing that seems to be too severe and again the majority of the larger openings has healed at this time. 04/14/19 on evaluation today patient actually appears to be doing quite well in regard to his bilateral lower extremities in fact I'm not even sure there's anything significantly open at this time at any site. Nonetheless he did have some trouble with these wraps where they are somewhat irritating him secondary to the fact that he has noted that the graph wasn't too close down to the end of this foot in a little bit short as well up to his knee. Otherwise things seem to be doing quite well. 04/21/19 upon evaluation today patient's wound bed actually showed evidence of being completely healed in regard to both lower extremities which is excellent news. There does not appear to be any signs of  active infection which is also good news. I'm very pleased in this regard. No fevers, chills, nausea, or vomiting noted at this time. 04/28/19 on evaluation today patient appears to be doing a little bit worse in regard to both lower extremities on the left mainly due to the fact that when he went infection disease the wrap was not wrapped quite high enough he developed a blister above this. On the right he is a small open area of nothing too significant but again this is continuing to give him some trouble he has been were in the Velcro compression that he has at home. 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 He's been tolerating the dressing changes without complication. Fortunately there's no signs of active infection at this time. No fevers, chills, nausea, or vomiting noted at this time. We have been trying to get an appointment with her lymphedema clinic in [facility] but unfortunately nobody can get them on phone with not been able to even fax information over the patient likewise  is not been able to get in touch with them. Overall I'm not sure exactly what's going on here with to reach out again today. 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 Still having a lot of drainage unfortunately. He tells me especially in the left but even on the right. There's no signs of active infection which is good news we've been using so ratcheted up to this point. 05/19/19 on evaluation today patient actually appears to be doing quite well with regard to his left lower extremity which is great news. Fortunately in regard to the right lower extremity has an issues with his wrap and he subsequently did remove this from what I'm understanding. Nonetheless long story short is what he had rewrapped once he removed it subsequently had maggots  underneath this wrap whenever he came in for evaluation today. With that being said they were obviously completely cleaned away by the nursing staff. The visit today which is excellent news. However he does appear to potentially have some infection around the right ankle region where the maggots were located as well. He will likely require anabiotic therapy today. 05/26/19 on evaluation today patient actually appears to be doing much better in regard to his bilateral lower extremities. I feel like the infection is under much better control. With that being said there were maggots noted when the wrap was removed yet again today. Again this could have potentially been left over from previous although at this time there does not appear to be any signs of significant drainage there was obviously on the wrap some drainage as well this contracted gnats or otherwise. Either way I do not see anything that appears to be doing worse in my pinion and in fact I think his drainage has slowed down quite significantly likely mainly due to the fact to his infection being under better control. 06/02/2019 on evaluation today patient actually appears to be doing well with regard to his bilateral lower extremities there is no signs of active infection at this time which is great news. With that being said he does have several open areas more so on the right than the left but nonetheless these are all significantly better than previously noted. 06/09/2019 on evaluation today patient actually appears to be doing well. His wrap stayed up and he did not cause any problems he had more drainage on the right compared to the left but overall I do not see any major issues at this time which is great news. 06/16/2019 on evaluation today patient appears to be doing excellent with regard to his lower extremities the only area that is open is a new blister that can have opened as of today on the medial ankle on the left. Other than  this he really seems to be doing great I see no major issues at this point. 06/23/2019 on evaluation today patient appears to be doing quite well with regard to his bilateral lower extremities. In fact he actually appears to be almost completely healed there is a small area of weeping noted of the right lower extremity just above the ankle. Nonetheless fortunately there is no signs of active infection at this time which is good news. No fevers, chills, nausea, vomiting, or diarrhea. 8/24; the patient arrived for a nurse visit today but complained of very significant pain in the left leg and therefore I was asked to look at this. Noted that he  did not have an open area on the left leg last week nevertheless this was wrapped. The patient states that he is not been able to put his compression pumps on the left leg because of the discomfort. He has not been systemically unwell 06/30/2019 on evaluation today patient unfortunately despite being excellent last week is doing much worse with regard to his left lower extremity today. In fact he had to come in for a nurse on Monday where his left leg had to be rewrapped due to excessive weeping Dr. Leanord Hawking placed him on doxycycline at that point. Fortunately there is no signs of active infection Systemically at this time which is good news. 07/07/2019 in regard to the patient's wounds today he actually seems to be doing well with his right lower extremity there really is nothing open or draining at this point this is great news. Unfortunately the left lower extremity is given him additional trouble at this time. There does not appear to be any signs of active infection nonetheless he does have a lot of edema and swelling noted at this point as well as blistering all of which has led to a much more poor appearing leg at this time compared to where it was 2 weeks ago when it was almost completely healed. Obviously this is a little discouraging for the patient. He  is try to contact the lymphedema clinic in [place] he has not been able to get through to them. 07/14/2019 on evaluation today patient actually appears to be doing slightly better with regard to his left lower extremity ulcers. Overall I do feel like at least at the top of the wrap that we have been placing this area has healed quite nicely and looks much better. The remainder of the leg is showing signs of improvement. Unfortunately in the thigh area he still has an open region on the left and again on the right he has been utilizing just a Band-Aid on an area that also opened on the thigh. Again this is an area that were not able to wrap although we did do an Ace wrap to provide some compression that something that obviously is a little less effective than the compression wraps we have been using on the lower portion of the leg. He does have an appointment with the lymphedema clinic in [facility] on Friday. 07/21/2019 on evaluation today patient appears to be doing better with regard to his lower extremity ulcers. He has been tolerating the dressing changes without complication. Fortunately there is no signs of active infection at this time. No fevers, chills, nausea, vomiting, or diarrhea. I did receive the paperwork from the physical therapist at the lymphedema clinic in [place]. Subsequently I signed off on that this morning and sent that back to him for further progression with the treatment plan. 07/28/2019 on evaluation today patient appears to be doing very well with regard to his right lower extremity where I do not see any open wounds at this point. Fortunately he is feeling great as far as that is concerned as well. In regard to the left lower extremity he has been having issues with still several areas of weeping and edema although the upper leg is doing better his lower leg still I think is going require the compression wrap at this time. No fevers, chills, nausea,  vomiting, or diarrhea. 08/04/2019 on evaluation today patient unfortunately is having new wounds on the right lower extremity. Again we have been using Unna boot wrap on that  side. We switched him to using his juxta lite wrap at home. With that being said he tells me he has been using it although his legs extremely swollen and to be honest really does not appear that he has been. I cannot know that for sure however. Nonetheless he has multiple new wounds on the right lower extremity at this time. Obviously we will have to see about getting this rewrapped for him today. 08/11/2019 on evaluation today patient appears to be doing fairly well with regard to his wounds. He has been tolerating the dressing changes including the compression wraps without complication. He still has a lot of edema in his upper thigh regions bilaterally he is supposed to be seeing the lymphedema clinic on the 15th of this month once his wraps arrive for the upper part of his legs. 08/18/2019 on evaluation today patient appears to be doing well with regard to his bilateral lower extremities at this point. He has been tolerating the dressing changes without complication. Fortunately there is no signs of active infection which is also good news. He does have a couple weeping areas on the first and second toe of the right foot he also has just a small area on the left foot upper leg and a small area on the left lower leg but overall he is doing quite well in my opinion. He is supposed to be getting his wraps shortly in fact tomorrow and then subsequently is seeing the lymphedema clinic next Wednesday on the 21st. Of note he is also leaving on the 25th to go on vacation for a week to the beach. For that reason and since there is some uncertainty about what there can be doing at lymphedema clinic next Wednesday I am get a make an appointment for next Friday here for [place] to see what we need to do for him prior to him leaving for  vacation. 10/23; patient arrives in considerable pain predominantly in the upper posterior calf just distal to the popliteal fossa also in the wound anteriorly above the major wound. This is probably cellulitis and he has had this recurrently in the past. He has no open wound on the right side and he has had an Radio broadcast assistant in that area. Finally I note that he has an area on the left posterior calf which by enlarge is mostly epithelialized. This protrudes beyond the borders of the surrounding skin in the setting of dry scaly skin and lymphedema. The patient is leaving for [facility] on Sunday. Per his longstanding pattern, he will not take his compression pumps with him predominantly out of fear that they will be stolen. He therefore asked that we put a Unna boot back on the right leg. He will also contact the wound care center in [facility] to see if they can change his dressing in the mid week. 11/3; patient returned from his vacation to [facility]. He was seen on 1 occasion at their wound care center. They did a 2 layer compression system as they did not have our 4-layer wrap. I am not completely certain what they put on the wounds. They did not change the Unna boot on the right. The patient is also seeing a lymphedema specialist physical therapist in [place]. It appears that he has some compression sleeve for his thighs which indeed look quite a bit better than I am used to seeing. He pumps over these with his external compression pumps. 11/10; the patient has a new wound on  the right medial thigh otherwise there is no open areas on the right. He has an area on the left leg posteriorly anteriorly and medially and an area over the left second toe. We have been using silver alginate. He thinks the injury on his thigh is secondary to friction from the compression sleeve he has. 11/17; the patient has a new wound on the right medial thigh last week. He thinks this is because he did not  have a underlying stocking for his thigh juxta lite apparatus. He now has this. The area is fairly large and somewhat angry but I do not think he has underlying cellulitis. ooHe has a intact blister on the right anterior tibial area. ooSmall wound on the right great toe dorsally ooSmall area on the medial left calf. 11/30; the patient does not have any open areas on his right leg and we did not take his juxta lite stocking off. However he states that on Friday his compression wrap fell down lodging around his upper mid calf area. As usual this creates a lot of problems for him. He called urgently today to be seen for a nurse visit however the nurse visit turned into a provider visit because of extreme erythema and pain in the left anterior tibia extending laterally and posteriorly. The area that is problematic is extensive 10/06/2019 upon evaluation today patient actually appears to be doing poorly in regard to his left lower extremity. He Dr. Leanord Hawking did place him on doxycycline this past Monday apparently due to the fact that he was doing much worse in regard to this left leg. Fortunately the doxycycline does seem to be helping. Unfortunately we are still having a very difficult time getting his edema under any type of control in order to anticipate discharge at some point. The only way were really able to control his lymphedema really is with compression wraps and that has only even seemingly temporary. He has been seeing a lymphedema clinic they are trying to help in this regard but still this has been somewhat frustrating in general for the patient. 10/13/19 on evaluation today patient appears to be doing excellent with regard to his right lower extremity as far as the wounds are concerned. His swelling is still quite extensive unfortunately. He is still having a lot of drainage from the thigh areas bilaterally which is unfortunate. He's been going to lymphedema clinic but again he still  really does not have this edema under control as far as his lower extremities are concern. With regard to his left lower extremity this seems to be improving and I do believe the doxycycline has been of benefit for him. He is about to complete the doxycycline. 10/20/2019 on evaluation today patient appears to be doing poorly in regard to his bilateral lower extremities. More in the right thigh he has a lot of irritation at this site unfortunately. In regard to the left lower extremity the wrap was not quite as high it appears and does seem to have caused him some trouble as well. Fortunately there is no evidence of systemic infection though he does have some blue-green drainage which has me concerned for the possibility of Pseudomonas. He tells me he is previously taking Cipro without complications and he really does not care for Levaquin however due to some of the side effects he has. He is not allergic to any medications specifically antibiotics that were aware of. 10/27/2019 on evaluation today patient actually does appear to be for the most part  doing better when compared to last week's evaluation. With that being said he still has multiple open wounds over the bilateral lower extremities. He actually forgot to start taking the Cipro and states that he still has the whole bottle. He does have several new blisters on left lower extremity today I think I would recommend he go ahead and take the Cipro based on what I am seeing at this point. 12/30-Patient comes at 1 week visit, 4 layer compression wraps on the left and Unna boot on the right, primary dressing Xtrasorb and silver alginate. Patient is taking his Cipro and has a few more days left probably 5-6, and the legs are doing better. He states he is using his compressions devices which I believe he has 11/10/2019 on evaluation today patient actually appears to be much better than last time I saw him 2 weeks ago. His wounds are significantly  improved and overall I am very pleased in this regard. Fortunately there is no signs of active infection at this time. He is just a couple of days away from completing Cipro. Overall his edema is much better he has been using his lymphedema pumps which I think is also helping at this point. Objective Constitutional Well-nourished and well-hydrated in no acute distress. Vitals Time Taken: 11:00 AM, Height: 70 in, Weight: 380.2 lbs, BMI: 54.5, Temperature: 97.8 F, Pulse: 50 bpm, Respiratory Rate: 20 breaths/min, Blood Pressure: 156/55 mmHg, Capillary Blood Glucose: 165 mg/dl. Respiratory normal breathing without difficulty. Psychiatric this patient is able to make decisions and demonstrates good insight into disease process. Alert and Oriented x 3. pleasant and cooperative. General Notes: Patient's wound bed currently showed signs of good granulation at this time. Fortunately there is no signs of active infection which is good news. No fevers, chills, nausea, vomiting, or diarrhea. No sharp debridement was necessary at any location today. Integumentary (Hair, Skin) Wound #145R status is Open. Original cause of wound was Gradually Appeared. The wound is located on the Right,Medial Upper Leg. The wound measures 1cm length x 1.4cm width x 0.1cm depth; 1.1cm^2 area and 0.11cm^3 volume. There is no tunneling or undermining noted. There is a medium amount of serosanguineous drainage noted. The wound margin is flat and intact. There is large (67-100%) red, pink granulation within the wound bed. There is no necrotic tissue within the wound bed. Wound #151 status is Open. Original cause of wound was Blister. The wound is located on the Left,Lateral Lower Leg. The wound measures 0.6cm length x 1cm width x 0.1cm depth; 0.471cm^2 area and 0.047cm^3 volume. There is Fat Layer (Subcutaneous Tissue) Exposed exposed. There is no tunneling or undermining noted. There is a medium amount of serous drainage  noted. The wound margin is distinct with the outline attached to the wound base. There is large (67-100%) pink granulation within the wound bed. There is no necrotic tissue within the wound bed. Wound #152 status is Healed - Epithelialized. Original cause of wound was Gradually Appeared. The wound is located on the Right Toe Second. The wound measures 0cm length x 0cm width x 0cm depth; 0cm^2 area and 0cm^3 volume. Wound #153 status is Healed - Epithelialized. Original cause of wound was Blister. The wound is located on the Right,Medial Lower Leg. The wound measures 0cm length x 0cm width x 0cm depth; 0cm^2 area and 0cm^3 volume. Wound #154 status is Open. Original cause of wound was Shear/Friction. The wound is located on the Left,Medial Lower Leg. The wound measures 0.4cm length x 0.5cm  width x 0.1cm depth; 0.157cm^2 area and 0.016cm^3 volume. There is no tunneling or undermining noted. There is a medium amount of serosanguineous drainage noted. The wound margin is distinct with the outline attached to the wound base. There is large (67-100%) red, pink granulation within the wound bed. There is no necrotic tissue within the wound bed. Wound #155 status is Open. Original cause of wound was Blister. The wound is located on the Right,Distal,Medial Lower Leg. The wound measures 2.3cm length x 1.8cm width x 0.1cm depth; 3.252cm^2 area and 0.325cm^3 volume. There is no tunneling or undermining noted. There is a medium amount of serosanguineous drainage noted. The wound margin is distinct with the outline attached to the wound base. There is large (67-100%) red, pink granulation within the wound bed. There is no necrotic tissue within the wound bed. 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 There was a Four Layer Compression Therapy Procedure by Yevonne Pax, RN. Post procedure Diagnosis Wound #154: Same as Pre-Procedure Wound #155 Pre-procedure diagnosis of Wound #155 is a Lymphedema located on the Right,Distal,Medial Lower Leg . There was a Radio broadcast assistant Compression Therapy Procedure by Yevonne Pax, RN. Post procedure Diagnosis Wound #155: Same as Pre-Procedure Plan Follow-up Appointments: Return Appointment in 1 week. Dressing Change Frequency: Do not change entire dressing for one week. - both lower legs Skin Barriers/Peri-Wound Care: TCA Cream or Ointment - to legs Wound Cleansing: May shower with protection. Primary Wound Dressing: Wound #145R Right,Medial Upper Leg: Calcium Alginate with Silver Wound #151 Left,Lateral Lower Leg: Calcium Alginate with Silver Wound #154 Left,Medial Lower Leg: Calcium Alginate with Silver Wound #155 Right,Distal,Medial Lower Leg: Calcium Alginate with Silver Secondary Dressing: Wound #145R Right,Medial Upper Leg: ABD pad Other: - zetuvit as needed Wound #151 Left,Lateral Lower Leg: ABD pad Other: - zetuvit as needed Wound #154 Left,Medial Lower Leg: ABD pad Other: - zetuvit as needed Wound #155 Right,Distal,Medial Lower Leg: ABD pad Other: - zetuvit as needed Edema Control: 4 layer compression: Left lower extremity - unna boot at upper portion of lower leg. Unna Boot to Right Lower Extremity Avoid standing for long periods of time Elevate legs to the level of the heart or above for 30 minutes daily and/or when sitting, a frequency of: - throughout the day Exercise regularly Segmental Compressive Device. -  lymphadema pumps 60 min 2 times per day Other: - lymphadema wraps to upper leg per lymphadema clinic 1. My suggestion at this point is good to be that we continue with the current wound care measures. This includes utilization of the silver alginate dressing to all wound sites. He is done very well with this. 2. We will continue with the 4 layer compression wrap for the left lower extremity and the Unna boot to the right lower extremity. 3. Along with continue with lymphedema pumps the patient also needs to continue to exercise regularly as well as elevate his legs when he is sitting. We will see patient back for reevaluation in 1 week here in the clinic. If anything worsens or changes patient will contact our  office for additional recommendations. Electronic Signature(s) Signed: 11/10/2019 11:34:45 AM By: Kevin Kelp PA-C Entered By: [name] on 11/10/2019 11:34:45 -------------------------------------------------------------------------------- SuperBill Details Patient Name: Date of Service: Kevin, [name] 11/10/2019 Medical Record [id number] Patient Account Number: [id number] Date of Birth/Sex: Treating RN: June 15, 1951 (69 y.o. [name] Primary Care Provider: Nicoletta [name] Other Clinician: Referring Provider: Treating Provider/Extender:[name] Weeks in Treatment: 198 Diagnosis Coding ICD-10 Codes Code Description 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 skin L97.221 Non-pressure chronic ulcer of left calf limited to breakdown of skin 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 of bilateral lower extremity I89.0 Lymphedema, not elsewhere classified E11.622 Type 2 diabetes mellitus with other skin ulcer 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, unspecified L03.116 Cellulitis of left lower limb Facility Procedures CPT4 Code Description: 10272536  (Facility Use Only) [id number] - APPLY UNNA BOOT RT Modifier: Quantity: 1 CPT4 Code Description: [id number] (Facility Use Only) 29581LT - APPLY MULTLAY COMPRS LWR LT LEG Modifier: 59 Quantity: 1 Physician Procedures CPT4: Code [id number] Description: 213 - WC PHYS LEVEL 3 - EST PT ICD-10 Diagnosis Description L97.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 right calf limited to break L97.221 Non-pressure chronic ulcer of left calf limited to breakd I87.333 Chronic venous hypertension (idiopathic)  with ulcer and i lower extremity I89.0 Lymphedema, not elsewhere classified Modifier: down of skin own of skin nflammation of b Quantity: 1 ilateral Electronic Signature(s) Signed: 11/10/2019 11:35:02 AM By: Kevin Kelp PA-C Entered By: [name] on 11/10/2019 11:35:00

## 2019-11-10 NOTE — Telephone Encounter (Signed)
Rx was sent for shingles to [facility] in [place] but unable to receive due to clinical staffing. He would like this sent to [facility], [address] in [place].  Okay to resend?

## 2019-11-10 NOTE — Telephone Encounter (Signed)
Yes this is fine.

## 2019-11-11 ENCOUNTER — Other Ambulatory Visit: Payer: Self-pay

## 2019-11-11 DIAGNOSIS — Z23 Encounter for immunization: Secondary | ICD-10-CM

## 2019-11-11 MED ORDER — ZOSTER VAC RECOMB ADJUVANTED 50 MCG/0.5ML IM SUSR: 0.5000 mL | Freq: Once | INTRAMUSCULAR | 0 refills | Status: AC

## 2019-11-11 NOTE — Telephone Encounter (Signed)
[facility]rix Rx resent to [facility] on [address], [place]. Pt has been notified

## 2019-11-17 ENCOUNTER — Other Ambulatory Visit: Payer: Self-pay

## 2019-11-17 ENCOUNTER — Telehealth: Payer: Self-pay | Admitting: Family Medicine

## 2019-11-17 ENCOUNTER — Encounter (HOSPITAL_BASED_OUTPATIENT_CLINIC_OR_DEPARTMENT_OTHER): Payer: Medicare Other | Attending: Physician Assistant | Admitting: Physician Assistant

## 2019-11-17 DIAGNOSIS — I87313 Chronic venous hypertension (idiopathic) with ulcer of bilateral lower extremity: Secondary | ICD-10-CM | POA: Diagnosis not present

## 2019-11-17 DIAGNOSIS — L03116 Cellulitis of left lower limb: Secondary | ICD-10-CM | POA: Diagnosis not present

## 2019-11-17 DIAGNOSIS — Z881 Allergy status to other antibiotic agents status: Secondary | ICD-10-CM | POA: Diagnosis not present

## 2019-11-17 DIAGNOSIS — I872 Venous insufficiency (chronic) (peripheral): Secondary | ICD-10-CM | POA: Diagnosis not present

## 2019-11-17 DIAGNOSIS — I87312 Chronic venous hypertension (idiopathic) with ulcer of left lower extremity: Secondary | ICD-10-CM | POA: Diagnosis not present

## 2019-11-17 DIAGNOSIS — L97229 Non-pressure chronic ulcer of left calf with unspecified severity: Secondary | ICD-10-CM | POA: Diagnosis not present

## 2019-11-17 DIAGNOSIS — Z9119 Patient's noncompliance with other medical treatment and regimen: Secondary | ICD-10-CM | POA: Diagnosis not present

## 2019-11-17 DIAGNOSIS — I87333 Chronic venous hypertension (idiopathic) with ulcer and inflammation of bilateral lower extremity: Secondary | ICD-10-CM | POA: Diagnosis not present

## 2019-11-17 DIAGNOSIS — L97119 Non-pressure chronic ulcer of right thigh with unspecified severity: Secondary | ICD-10-CM | POA: Diagnosis not present

## 2019-11-17 DIAGNOSIS — E11622 Type 2 diabetes mellitus with other skin ulcer: Secondary | ICD-10-CM | POA: Diagnosis not present

## 2019-11-17 DIAGNOSIS — I10 Essential (primary) hypertension: Secondary | ICD-10-CM | POA: Diagnosis not present

## 2019-11-17 DIAGNOSIS — L97221 Non-pressure chronic ulcer of left calf limited to breakdown of skin: Secondary | ICD-10-CM | POA: Diagnosis not present

## 2019-11-17 DIAGNOSIS — I890 Lymphedema, not elsewhere classified: Secondary | ICD-10-CM | POA: Diagnosis not present

## 2019-11-17 DIAGNOSIS — E1140 Type 2 diabetes mellitus with diabetic neuropathy, unspecified: Secondary | ICD-10-CM | POA: Diagnosis not present

## 2019-11-17 DIAGNOSIS — I482 Chronic atrial fibrillation, unspecified: Secondary | ICD-10-CM | POA: Diagnosis not present

## 2019-11-17 NOTE — Telephone Encounter (Signed)
The cost of the medicine and administration would be $329 for in office fee. Please notify patient

## 2019-11-17 NOTE — Telephone Encounter (Signed)
Patient called and asked if he was able to get the Shringrix injection in our office.  Please call patient [phone]

## 2019-11-17 NOTE — Progress Notes (Addendum)
[name], [name] ([id number]) Visit Report for 11/17/2019 Chief Complaint Document Details Patient Name: Date of Service: [name], [name] 11/17/2019 10:15 AM Medical Record [id number] Patient Account Number: [id number] Date of Birth/Sex: Treating RN: Aug 22, 1951 (69 y.o. M) Primary Care Provider: Nicoletta Ba Other Clinician: Referring Provider: Treating Provider/Extender:[name], [name], [name] in Treatment: 199 Information Obtained from: Patient Chief Complaint patient is here for evaluation venous/lymphedema weeping Electronic Signature(s) Signed: 11/17/2019 10:40:25 AM By: Lenda Kelp PA-C Entered By: Lenda Kelp on 11/17/2019 10:40:25 -------------------------------------------------------------------------------- HPI Details Patient Name: Date of Service: [name], [name] 11/17/2019 10:15 AM Medical Record [id number] Patient Account Number: [id number] Date of Birth/Sex: Treating RN: 1951/04/15 (69 y.o. M) Primary Care Provider: Nicoletta Ba Other Clinician: Referring Provider: Treating Provider/Extender:[name], [name], [name] in Treatment: 199 History of Present Illness HPI Description: Referred by PCP for consultation. Patient has long standing history of BLE venous stasis, no prior ulcerations. At beginning of month, developed cellulitis and weeping. Received IM Rocephin followed by Keflex and resolved. Wears compression stocking, appr 6 months old. Not sure strength. No present drainage. 01/22/16 this is a patient who is a type II diabetic on insulin. 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 He tells me he religiously wears pressure stockings of uncertain strength. He was here with weeping edema about 8 months ago but did not have an open wound. Roughly a month ago he had a reopening on his bilateral legs. He is been using bandages and Neosporin. He does not complain of pain. 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 Last BUN/creatinine I have is from 11/20/15 at 13 and 1.0 respectively 01/29/16; patient arrives today having tolerated the Profore wrap. He brought in his stockings and these are 18 mmHg stockings he bought from [place]. The compression here is likely inadequate. He does not complain of pain or excessive drainage she has no systemic symptoms. The wound on the right looks improved as does the one on the left although one on the left is more substantial with still tissue at risk below the actual wound area on the bilateral posterior calf 02/05/16; patient arrives with poor edema control. He states that we did put a 4 layer compression on it last week. No weight appear 5 this. 02/12/16; the area on the posterior right Has healed. The left Has a substantial wound that has necrotic surface eschar that requires a debridement with a curette. 02/16/16;the patient called or a Nurse visit secondary to increased swelling. He had been in earlier in the week with his right leg healed. He was transitioned to is on pressure stocking on the right leg with the only open wound on the left, a substantial area on the left posterior calf. Note he has a history of severe lower extremity edema, he has a history of chronic atrial fibrillation but not heart failure per my notes but I'll need to research this. He is not complaining of chest pain shortness of breath or orthopnea. The intake nurse noted blisters on the previously closed right leg 02/19/16; this is the patient's regular visit day. I see him on Friday with escalating edema new wounds on the right leg and clear signs of at least right ventricular heart failure. I increased his Lasix to 40 twice a day. He is returning currently in follow-up. States he is noticed a decrease  in that the edema 02/26/16 patient's legs have much  less edema. There is nothing really open on the right leg. The left leg has improved condition of the large superficial wound on the posterior left leg 03/04/16; edema control is very much better. The patient's right leg wounds have healed. On the left leg he continues to have severe venous inflammation on the posterior aspect of the left leg. There is no tenderness and I don't think any of this is cellulitis. 03/11/16; patient's right leg is married healed and he is in his own stocking. The patient's left leg has deteriorated somewhat. There is a lot of erythema around the wound on the posterior left leg. There is also a significant rim of erythema posteriorly just above where the wrap would've ended there is a new wound in this location and a lot of tenderness. Can't rule out cellulitis in this area. 03/15/16; patient's right leg remains healed and he is in his own stocking. The patient's left leg is much better than last review. His major wound on the posterior aspect of his left Is almost fully epithelialized. He has 3 small injuries from the wraps. Really. Erythema seems a lot better on antibiotics 03/18/16; right leg remains healed and he is in his own stocking. The patient's left leg is much better. The area on the posterior aspect of the left calf is fully epithelialized. His 3 small injuries which were wrap injuries on the left are improved only one seems still open his erythema has resolved 03/25/16; patient's right leg remains healed and he is in his own stocking. There is no open area today on the left leg posterior leg is completely closed up. His wrap injuries at the superior aspect of his leg are also resolved. He looks as though he has some irritation on the dorsal ankle but this is fully epithelialized without evidence of infection. 03/28/16; we discharged this patient on Monday. Transitioned him into his own stocking. There were problems almost immediately with uncontrolled swelling  weeping edema multiple some of which have opened. He does not feel systemically unwell in particular no chest pain no shortness of breath and he does not feel 04/08/16; the edema is under better control with the Profore light wrap but he still has pitting edema. There is one large wound anteriorly 2 on the medial aspect of his left leg and 3 small areas on the superior posterior calf. Drainage is not excessive he is tolerating a Profore light well 04/15/16; put a Profore wrap on him last week. This is controlled is edema however he had a lot of pain on his left anterior foot most of his wounds are healed 04/22/16 once again the patient has denuded areas on the left anterior foot which he states are because his wrap slips up word. He saw his primary physician today is on Lasix 40 twice a day and states that he his weight is down 20 pounds over the last 3 months. 04/29/16: Much improved. left anterior foot much improved. He is now on Lasix 80 mg per day. Much improved edema control 05/06/16; I was hoping to be able to discharge him today however once again he has blisters at a low level of where the compression was placed last week mostly on his left lateral but also his left medial leg and a small area on the anterior part of the left foot. 05/09/16; apparently the patient went home after his appointment on 7/4 later in the evening developing pain  in his upper medial thigh together with subjective fever and chills although his temperature was not taken. The pain was so intense he felt he would probably have to call 911. However he then remembered that he had leftover doxycycline from a previous round of antibiotics and took these. By the next morning he felt a lot better. He called and spoke to one of our nurses and I approved doxycycline over the phone thinking that this was in relation to the wounds we had previously seen although they were definitely were not. The patient feels a lot better old fever no  chills he is still working. Blood sugars are reasonably controlled 05/13/16; patient is back in for review of his cellulitis on his anterior medial upper thigh. He is taking doxycycline this is a lot better. Culture I did of the nodular area on the dorsal aspect of his foot grew MRSA this also looks a lot better. 05/20/16; the patient is cellulitis on the medial upper thigh has resolved. All of his wound areas including the left anterior foot, areas on the medial aspect of the left calf and the lateral aspect of the calf at all resolved. He has a new blister on the left dorsal foot at the level of the fourth toe this was excised. No evidence of infection 05/27/16; patient continues to complain weeping edema. He has new blisterlike wounds on the left anterior lateral and posterior lateral calf at the top of his wrap levels. The area on his left anterior foot appears better. He is not complaining of fever, pain or pruritus in his feet. 05/30/16; the patient's blisters on his left anterior leg posterior calf all look improved. He did not increase the Lasix 100 mg as I suggested because he was going to run out of his 40 mg tablets. He is still having weeping edema of his toes 06/03/16; I renewed his Lasix at 80 mg once a day as he was about to run out when I last saw him. He is on 80 mg of Lasix now. I have asked him to cut down on the excessive amount of water he was drinking and asked him to drink according to his thirst mechanisms 06/12/2016 -- was seen 2 days ago and was supposed to wear his compression stockings at home but he is developed lymphedema and superficial blisters on the left lower extremity and hence came in for a review 06/24/16; the remaining wound is on his left anterior leg. He still has edema coming from between his toes. There is lymphedema here however his edema is generally better than when I last saw this. He has a history of atrial fibrillation but does not have a known history  of congestive heart failure nevertheless I think he probably has this at least on a diastolic basis. 07/01/16 I reviewed his echocardiogram from January 2017. This was essentially normal. He did not have LVH, EF of 55-60%. His right ventricular function was normal although he did have trivial tricuspid and pulmonic regurgitation. This is not audible on exam however. I increased his Lasix to do massive edema in his legs well above his knees I think in early July. He was also drinking an excessive amount of water at the time. 07/15/16; missed his appointment last week because of the Labor Day holiday on Monday. He could not get another appointment later in the week. Started to feel the wrap digging in superiorly so we remove the top half and the bottom half of his wrap.  He has extensive erythema and blistering superiorly in the left leg. Very tender. Very swollen. Edema in his foot with leaking edema fluid. He has not been systemically unwell 07/22/16; the area on the left leg laterally required some debridement. The medial wounds look more stable. His wrap injury wounds appear to have healed. Edema and his foot is better, weeping edema is also better. He tells me he is meeting with the supplier of the external compression pumps at work 08/05/16; the patient was on vacation last week in [facility]. His wrap is been on for an extended period of time. Also over the weekend he developed an extensive area of tender erythema across his anterior medial thigh. He took to doxycycline yesterday that he had leftover from a previous prescription. The patient complains of weeping edema coming out of his toes 08/08/16; I saw this patient on 10/2. He was tender across his anterior thigh. I put him on doxycycline. He returns today in follow-up. He does not have any open wounds on his lower leg, he still has edema weeping into his toes. 08/12/16; patient was seen back urgently today to follow-up for his extensive left  thigh cellulitis/erysipelas. He comes back with a lot less swelling and erythema pain is much better. I believe I gave him Augmentin and Cipro. His wrap was cut down as he stated a roll down his legs. He developed blistering above the level of the wrap that remained. He has 2 open blisters and 1 intact. 08/19/16; patient is been doing his primary doctor who is increased his Lasix from 40-80 once a day or 80 already has less edema. Cellulitis has remained improved in the left thigh. 2 open areas on the posterior left calf 08/26/16; he returns today having new open blisters on the anterior part of his left leg. He has his compression pumps but is not yet been shown how to use some vital representative from the supplier. 09/02/16 patient returns today with no open wounds on the left leg. Some maceration in his plantar toes 09/10/2016 -- Dr. Leanord Hawking had recently discharged him on 09/02/2016 and he has come right back with redness swelling and some open ulcers on his left lower extremity. He says this was caused by trying to apply his compression stockings and he's been unable to use this and has not been able to use his lymphedema pumps. He had some doxycycline leftover and he has started on this a few days ago. 09/16/16; there are no open wounds on his leg on the left and no evidence of cellulitis. 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 He does not complain of symptoms here. I am not clear use using his external compression pumps. 09/23/16; I have not seen this patient in 2 weeks. He canceled his appointment 10 days ago as he was going on vacation. He tells me that on Monday he noticed a large area on his posterior left leg which is been draining copiously and is reopened into a large wound. He is been using ABDs and the external part of his juxtalite, according to our nurse this was not on properly. 10/07/16; Still a substantial area on the  posterior left leg. Using silver alginate 10/14/16; in general better although there is still open area which looks healthy. Still using silver alginate. He reminds me that this happen before he left for [facility]. Today while he was showering in the morning. He had been using  his juxtalite's 10/21/16; the area on his posterior left leg is fully epithelialized. However he arrives today with a large area of tender erythema in his medial and posterior left thigh just above the knee. I have marked the area. Once again he is reluctant to consider hospitalization. I treated him with oral antibiotics in the past for a similar situation with resolution I think with doxycycline however this area it seems more extensive to me. He is not complaining of fever but does have chills and says states he is thirsty. His blood sugar today was in the 140s at home 10/25/16 the area on his posterior left leg is fully epithelialized although there is still some weeping edema. The large area of tenderness and erythema in his medial and posterior left thigh is a lot less tender although there is still a lot of swelling in this thigh. He states he feels a lot better. He is on doxycycline and Augmentin that I started last week. This will continued until Tuesday, December 26. I have ordered a duplex ultrasound of the left thigh rule out DVT whether there is an abscess something that would need to be drained I would also like to know. 11/01/16; he still has weeping edema from a not fully epithelialized area on his left posterior calf. Most of the rest of this looks a lot better. He has completed his antibiotics. His thigh is a lot better. Duplex ultrasound did not show a DVT in the thigh 11/08/16; he comes in today with more Denuded surface epithelium from the posterior aspect of his calf. There is no real evidence of cellulitis. The superior aspect of his wrap appears to have put quite an indentation in his leg just below  the knee and this may have contributed. He does not complain of pain or fever. We have been using silver alginate as the primary dressing. The area of cellulitis in the right thigh has totally resolved. He has been using his compression stockings once a week 11/15/16; the patient arrives today with more loss of epithelium from the posterior aspect of his left calf. He now has a fairly substantial wound in this area. The reason behind this deterioration isn't exactly clear although his edema is not well controlled. He states he feels he is generally more swollen systemically. He is not complaining of chest pain shortness of breath fever. Tells me he has an appointment with his primary physician in early February. He is on 80 mg of oral Lasix a day. He claims compliance with the external compression pumps. He is not having any pain in his legs similar to what he has with his recurrent cellulitis 11/22/16; the patient arrives a follow-up of his large area on his left lateral calf. This looks somewhat better today. He came in earlier in the week for a dressing change since I saw him a week ago. He is not complaining of any pain no shortness of breath no chest pain 11/28/16; the patient arrives for follow-up of his large area on the left lateral calf this does not look better. In fact it is larger weeping edema. The surface of the wound does not look too bad. We have been using silver alginate although I'm not certain that this is a dressing issue. 12/05/16; again the patient follows up for a large wound on the left lateral and left posterior calf this does not look better. There continues to be weeping edema necrotic surface tissue. More worrisome than this once again there  is erythema below the wound involving the distal Achilles and heel suggestive of cellulitis. He is on his feet working most of the day of this is not going well. We are changing his dressing twice a week to facilitate the  drainage. 12/12/16; not much change in the overall dimensions of the large area on the left posterior calf. This is very inflamed looking. I gave him an. Doxycycline last week does not really seem to have helped. He found the wrap very painful indeed it seems to of dog into his legs superiorly and perhaps around the heel. He came in early today because the drainage had soaked through his dressings. 12/19/16- patient arrives for follow-up evaluation of his left lower extremity ulcers. He states that he is using his lymphedema pumps once daily when there is "no drainage". He admits to not using his lipedema pumps while under current treatment. His blood sugars have been consistently between 150-200. 12/26/16; the patient is not using his compression pumps at home because of the wetness on his feet. I've advised him that I think it's important for him to use this daily. He finds his feet too wet, he can put a plastic bag over his legs while he is in the pumps. Otherwise I think will be in a vicious circle. We are using silver alginate to the major area on his left posterior calf 01/02/17; the patient's posterior left leg has further of all into 3 open wounds. All of them covered with a necrotic surface. He claims to be using his compression pumps once a day. His edema control is marginal. Continue with silver alginate 01/10/17; the patient's left posterior leg actually looks somewhat better. There is less edema, less erythema. Still has 3 open areas covered with a necrotic surface requiring debridement. He claims to be using his compression pumps once a day his edema control is better 01/17/17; the patient's left posterior calf look better last week when I saw him and his wrap was changed 2 days ago. He has noted increasing pain in the left heel and arrives today with much larger wounds extensive erythema extending down into the entire heel area especially tender medially. He is not systemically unwell CBGs  have been controlled no fever. Our intake nurse showed me limegreen drainage on his AVD pads. 01/24/17; his usual this patient responds nicely to antibiotics last week giving him Levaquin for presumed Pseudomonas. The whole entire posterior part of his leg is much better much less inflamed and in the case of his Achilles heel area much less tender. He has also had some epithelialization posteriorly there are still open areas here and still draining but overall considerably better 01/31/17- He has continue to tolerate the compression wraps. he states that he continues to use the lymphedema pumps daily, and can increase to twice daily on the weekends. He is voicing no complaints or concerns regarding his LLE ulcers 02/07/17-he is here for follow-up evaluation. He states that he noted some erythema to the left medial and anterior thigh, which he states is new as of yesterday. He is concerned about recurrent cellulitis. He states his blood sugars have been slightly elevated, this morning in the 180s 02/14/17; he is here for follow-up evaluation. When he was last here there was erythema superiorly from his posterior wound in his anterior thigh. He was prescribed Levaquin however a culture of the wound surface grew MRSA over the phone I changed him to doxycycline on Monday and things seem to be a  lot better. 02/24/17; patient missed his appointment on Friday therefore we changed his nurse visit into a physician visit today. Still using silver alginate on the large area of the posterior left thigh. He isn't new area on the dorsal left second toe 03/03/17; actually better today although he admits he has not used his external compression pumps in the last 2 days or so because of work responsibilities over the weekend. 03/10/17; continued improvement. External compression pumps once a day almost all of his wounds have closed on the posterior left calf. Better edema control 03/17/17; in general improved. He still  has 3 small open areas on the lateral aspect of his left leg however most of the area on the posterior part of his leg is epithelialized. He has better edema control. He has an ABD pad under his stocking on the right anterior lower leg although he did not let us look at that today. 03/24/17; patient arrives back in clinic today with no open areas however there are areas on the posterior left calf and anterior left calf that are less than 100% epithelialized. His edema is well controlled in the left lower leg. There is some pitting edema probably lymphedema in the left upper thigh. He uses compression pumps at home once per day. I tried to get him to do this twice a day although he is very reticent. 04/01/2017 -- for the last 2 days he's had significant redness, tenderness and weeping and came in for an urgent visit today. 04/07/17; patient still has 6 more days of doxycycline. He was seen by Dr. Meyer Russel last Wednesday for cellulitis involving the posterior aspect, lateral aspect of his Involving his heel. For the most part he is better there is less erythema and less weeping. He has been on his feet for 12 hours 2 over the weekend. Using his compression pumps once a day 04/14/17 arrives today with continued improvement. Only one area on the posterior left calf that is not fully epithelialized. He has intense bilateral venous inflammation associated with his chronic venous insufficiency disease and secondary lymphedema. We have been using silver alginate to the left posterior calf wound In passing he tells [place] today that the right leg but we have not seen in quite some time has an open area on it but he doesn't want [place] to look at this today states he will show this to [place] next week. 04/21/17; there is no open area on his left leg although he still reports some weeping edema. He showed [place] his right leg today which is the first time we've seen this leg in a long time. He has a large area of open wound on the  right leg anteriorly healthy granulation. Quite a bit of swelling in the right leg and some degree of venous inflammation. He told us about the right leg in passing last week but states that deterioration in the right leg really only happened over the weekend 04/28/17; there is no open area on the left leg although there is an irritated part on the posterior which is like a wrap injury. The wound on the right leg which was new from last week at least to [place] is a lot better. 05/05/17; still no open area on the left leg. Patient is using his new compression stocking which seems to be doing a good job of controlling the edema. He states he is using his compression pumps once per day. The right leg still has an open wound although it  is better in terms of surface area. Required debridement. A lot of pain in the posterior right Achilles marked tenderness. Usually this type of presentation this patient gives concern for an active cellulitis 05/12/17; patient arrives today with his major wound from last week on the right lateral leg somewhat better. Still requiring debridement. He was using his compression stocking on the left leg however that is reopened with superficial wounds anteriorly he did not have an open wound on this leg previously. He is still using his juxta light's once daily at night. He cannot find the time to do this in the morning as he has to be at work by 7 AM 05/19/17; right lateral leg wound looks improved. No debridement required. The concerning area is on the left posterior leg which appears to almost have a subcutaneous hemorrhagic component to it. We've been using silver alginate to all the wounds 05/26/17; the right lateral leg wound continues to look improved. However the area on the left posterior calf is a tightly adherent surface. [place] using silver alginate. Because of the weeping edema in his legs there is very little good alternatives. 06/02/17; the patient left here last week  looking quite good. Major wound on the left posterior calf and a small one on the right lateral calf. Both of these look satisfactory. He tells me that by Wednesday he had noted increased pain in the left leg and drainage. He called on Thursday and Friday to get an appointment here but we were blocked. He did not go to urgent care or his primary physician. He thinks he had a fever on Thursday but did not actually take his temperature. He has not been using his compression pumps on the left leg because of pain. I advised him to go to the emergency room today for IV antibiotics for stents of left leg cellulitis but he has refused I have asked him to take 2 days off work to keep his leg elevated and he has refused this as well. In view of this I'm going to call him and Augmentin and doxycycline. 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 However after a detailed discussion regarding the risk benefits and alternatives he refuses to get admitted to the hospital. With no other recourse I will continue him on oral antibiotics as before and hopefully he'll have his infectious disease consultation this week. 06/16/2017 -- the patient was seen today by the nurse practitioner at infectious disease Ms. Dixon. 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 He has also been advise Lotrimin cream locally. she also had wise IV antibiotics for his condition if it flares up 06/23/17; patient arrives today with drainage bilaterally although the remaining wound on the left posterior calf after cleaning up  today "highlighter yellow drainage" did not look too bad. Unfortunately he has had breakdown on the right anterior leg [previously this leg had not been open and he is using a black stocking] he went to see infectious disease and is been put on clindamycin 150 mg daily, I did not verify the  dose although I'm not familiar with using clindamycin in this dosing range, perhaps for prophylaxisoo 06/27/17; I brought this patient back today to follow-up on the wound deterioration on the right lower leg together with surrounding cellulitis. I started him on doxycycline 4 days ago. This area looks better however he comes in today with intense cellulitis on the medial part of his left thigh. This is not have a wound in this area. Extremely tender. We've been using silver alginate to the wounds on the right lower leg left lower leg with bilateral 4 layer compression he is using his external compression pumps once a day 07/04/17; patient's left medial thigh cellulitis looks better. He has not been using his compression pumps as his insert said it was contraindicated with cellulitis. His right leg continues to make improvements all the wounds are still open. We only have one remaining wound on the left posterior calf. Using silver alginate to all open areas. He is on doxycycline which I started a week ago and should be finishing I gave him Augmentin after Thursday's visit for the severe cellulitis on the left medial thigh which fortunately looks better 07/14/17; the patient's left medial thigh cellulitis has resolved. The cellulitis in his right lower calf on the right also looks better. All of his wounds are stable to improved we've been using silver alginate he has completed the antibiotics I have given him. He has clindamycin 150 mg once a day prescribed by infectious disease for prophylaxis, I've advised him to start this now. We have been using bilateral Unna boots over silver alginate to the wound  areas 07/21/17; the patient is been to see infectious disease who noted his recurrent problems with cellulitis. He was not able to tolerate prophylactic clindamycin therefore he is on amoxicillin 500 twice a day. He also had a second daily dose of Lasix added By Dr. Melford Aase but he is not taking this. Nor is he being completely compliant with his compression pumps a especially not this week. He has 2 remaining wounds one on the right posterior lateral lower leg and one on the left posterior medial lower leg. 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 The patient has Unna boots bilaterally. 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 He has bilateral Unna boots for compression and silver alginate to his wounds. Arrives today with his legs looking as good as I have seen him in quite some time. Not surprisingly his wounds look better as well with improvement on the right lateral leg venous insufficiency wound and also the left medial leg. He is still using the compression pumps once a day 08/11/17; both legs appear to be doing better wounds on the right lateral and left medial legs look better. Skin on the right leg quite good. He is been using silver alginate as the primary dressing. I'm going to use Anasept gel calcium alginate and maintain all the secondary dressings 08/18/17; the patient continues to actually do quite well. The area on his right lateral leg is just about closed the left medial also looks better although it is still moist in this area. His edema is well controlled we have been using Anasept gel with calcium alginate and the usual secondary dressings, 4 layer compression and once daily use of his compression  pumps "always been able to manage 09/01/17; the  patient continues to do reasonably well in spite of his trip to [place]. The area on the right lateral leg is epithelialized. Left is much better but still open. He has more edema and more chronic erythema on the left leg [venous inflammation] 09/08/17; he arrives today with no open wound on the right lateral leg and decently controlled edema. Unfortunately his left leg is not nearly as in his good situation as last week.he apparently had increasing edema starting on Saturday. He edema soaked through into his foot so used a plastic bag to walk around his home. The area on the medial right leg which was his open area is about the same however he has lost surface epithelium on the left lateral which is new and he has significant pain in the Achilles area of the left foot. He is already on amoxicillin chronically for prophylaxis of cellulitis in the left leg 09/15/17; he is completed a week of doxycycline and the cellulitis in the left posterior leg and Achilles area is as usual improved. He still has a lot of edema and fluid soaking through his dressings. There is no open wound on the right leg. He saw infectious disease NP today 09/22/17;As usual 1 we transition him from our compression wraps to his stockings things did not go well. He has several small open areas on the right leg. He states this was caused by the compression wrap on his skin although he did not wear this with the stockings over them. He has several superficial areas on the left leg medially laterally posteriorly. He does not have any evidence of active cellulitis especially involving the left Achilles The patient is traveling from [facility] Saturday going to [facility]. He states he isn't attempting to get an appointment with a heel objects wound center there to change his dressings. I am not completely certain whether this will work 10/06/17; the patient came in on Friday for a nurse visit and the nurse reported that his legs  actually look quite good. He arrives in clinic today for his regular follow-up visit. He has a new wound on his left third toe over the PIP probably caused by friction with his footwear. He has small areas on the left leg and a very superficial but epithelialized area on the right anterior lateral lower leg. Other than that his legs look as good as I've seen him in quite some time. We have been using silver alginate Review of systems; no chest pain no shortness of breath other than this a 10 point review of systems negative 10/20/17; seen by Dr. Meyer Russel last week. He had taken some antibiotics [doxycycline] that he had left over. Dr. Meyer Russel thought he had candida infection and declined to give him further antibiotics. He has a small wound remaining on the right lateral leg several areas on the left leg including a larger area on the left posterior several left medial and anterior and a small wound on the left lateral. The area on the left dorsal third toe looks a lot better. ROS; Gen.; no fever, respiratory no cough no sputum Cardiac no chest pain other than this 10 point review of system is negative 10/30/17; patient arrives today having fallen in the bathtub 3 days ago. It took him a while to get up. He has pain and maceration in the wounds on his left leg which have deteriorated. He has not been using his pumps he also has some  maceration on the right lateral leg. 11/03/17; patient continues to have weeping edema especially in the left leg. This saturates his dressings which were just put on on 12/27. As usual the doxycycline seems to take care of the cellulitis on his lower leg. He is not complaining of fever, chills, or other systemic symptoms. He states his leg feels a lot better on the doxycycline I gave him empirically. He also apparently gets injections at his primary doctor's officeo Rocephin for cellulitis prophylaxis. I didn't ask him about his compression pump compliance today I think  that's probably marginal. Arrives in the clinic with all of his dressings primary and secondary macerated full of fluid and he has bilateral edema 11/10/17; the patient's right leg looks some better although there is still a cluster of wounds on the right lateral. The left leg is inflamed with almost circumferential skin loss medially to laterally although we are still maintaining anteriorly. He does not have overt cellulitis there is a lot of drainage. He is not using compression pumps. We have been using silver alginate to the wound areas, there are not a lot of options here 11/17/17; the patient's right leg continues to be stable although there is still open wounds, better than last week. The inflammation in the left leg is better. Still loss of surface layer epithelium especially posteriorly. There is no overt cellulitis in the amount of edema and his left leg is really quite good, tells me he is using his compression pumps once a day. 11/24/17; patient's right leg has a small superficial wound laterally this continues to improve. The inflammation in the left leg is still improving however we have continuous surface layer epithelial loss posteriorly. There is no overt cellulitis in the amount of edema in both legs is really quite good. He states he is using his compression pumps on the left leg once a day for 5 out of 7 days 12/01/17; very small superficial areas on the right lateral leg continue to improve. Edema control in both legs is better today. He has continued loss of surface epithelialization and left posterior calf although I think this is better. We have been using silver alginate with large number of absorptive secondary dressings 4 layer on the left Unna boot on the right at his request. He tells me he is using his compression pumps once a day 12/08/17; he has no open area on the right leg is edema control is good here. On the left leg however he has marked erythema and tenderness  breakdown of skin. He has what appears to be a wrap injury just distal to the popliteal fossa. 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 There was swelling erythema and breakdown in the right leg. He has much worse skin breakdown in the left leg as well multiple open areas medially and posteriorly as well as laterally. He tells me he has been using his compression pumps but tells me he feels that the drainage out of his leg is worse when he uses a compression pumps. To be fair to him he is been saying this for a while however I don't know that I have really been listening to this. I wonder if the compression pumps are  working properly 12/22/17;. Once again he arrives with severe erythema, weeping edema from the left greater than right leg. Noncompliance with compression pumps. New this visit he is complaining of pain on the lateral aspect of the right leg and the medial aspect of his right thigh. He apparently saw his cardiologist Dr. [name] who was ordered an echocardiogram area and I think this is a step in the right direction 12/25/17; started his doxycycline Monday night. There is still intense erythema of the right leg especially in the anterior thigh although there is less tenderness. The erythema around the wound on the right lateral calf also is less tender. He still complaining of pain in the left heel. His wounds are about the same right lateral left medial left lateral. Superficial but certainly not close to closure. He denies being systemically unwell no fever chills no abdominal pain no diarrhea 12/29/17; back in follow-up of his extensive right calf and right thigh cellulitis. I added amoxicillin to cover  possible doxycycline resistant strep. This seems to of done the trick he is in much less pain there is much less erythema and swelling. He has his echocardiogram at 11:00 this morning. X-ray of the left heel was also negative. 01/05/18; the patient arrived with his edema under much better control. Now that he is retired he is able to use his compression pumps daily and sometimes twice a day per the patient. He has a wound on the right leg the lateral wound looks better. Area on the left leg also looks a lot better. He has no evidence of cellulitis in his bilateral thighs I had a quick peak at his echocardiogram. He is in normal ejection fraction and normal left ventricular function. He has moderate pulmonary hypertension moderately reduced right ventricular function. One would have to wonder about chronic sleep apnea although he says he doesn't snore. He'll review the echocardiogram with his cardiologist. 01/12/18; the patient arrives with the edema in both legs under exemplary control. He is using his compression pumps daily and sometimes twice daily. His wound on the right lateral leg is just about closed. He still has some weeping areas on the posterior left calf and lateral left calf although everything is just about closed here as well. I have spoken with Aldean Baker who is the patient's nurse practitioner and infectious disease. She was concerned that the patient had not understood that the parenteral penicillin injections he was receiving for cellulitis prophylaxis was actually benefiting him. I don't think the patient actually saw that I would tend to agree we were certainly dealing with less infections although he had a serious one last month. 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 He is healed. He'll be placed in juxtalite compression wraps and increase his lymphedema pumps to twice daily. We will follow up again next week to ensure there are no  issues with the new regiment. 01/20/18-he is here for evaluation of bilateral lower extremity weeping edema. Yesterday he was placed in compression wrap to the right lower extremity and compression stocking to left lower shrubbery. He states he uses lymphedema pumps last night and again this morning and noted a blister to the left lower extremity. On exam he was noted to have drainage to the right lower extremity. He will be placed in Unna boots bilaterally and follow-up next week 01/26/18; patient was actually discharged a week ago to his own juxta light stockings only to return the next day with bilateral lower  extremity weeping edema.he was placed in bilateral Unna boots. He arrives today with pain in the back of his left leg. There is no open area on the right leg however there is a linear/wrap injury on the left leg and weeping edema on the left leg posteriorly. I spoke with infectious disease about 10 days ago. They were disappointed that the patient elected to discontinue prophylactic intramuscular penicillin shots as they felt it was particularly beneficial in reducing the frequency of his cellulitis. I discussed this with the patient today. He does not share this view. He'll definitely need antibiotics today. Finally he is traveling to [place] and trauma leaving this Saturday and returning a week later and he does not travel with his pumps. He is going by car 01/30/18; patient was seen 4 days ago and brought back in today for review of cellulitis in the left leg posteriorly. I put him on amoxicillin this really hasn't helped as much as I might like. He is also worried because he is traveling to [facility] trauma by car. Finally we will be rewrapping him. There is no open area on the right leg over his left leg has multiple weeping areas as usual 02/09/18; The same wrap on for 10 days. He did not pick up the last doxycycline I prescribed for him. He apparently took 4 days worth he already had.  There is nothing open on his right leg and the edema control is really quite good. He's had damage in the left leg medially and laterally especially probably related to the prolonged use of Unna boots 02/12/18; the patient arrived in clinic today for a nurse visit/wrap change. He complained of a lot of pain in the left posterior calf. He is taking doxycycline that I previously prescribed for him. Unfortunately even though he used his stockings and apparently used to compression pumps twice a day he has weeping edema coming out of the lateral part of his right leg. This is coming from the lower anterior lateral skin area. 02/16/18; the patient has finished his doxycycline and will finish the amoxicillin 2 days. The area of cellulitis in the left calf posteriorly has resolved. He is no longer having any pain. He tells me he is using his compression pumps at least once a day sometimes twice. 02/23/18; the patient finished his doxycycline and Amoxil last week. On Friday he noticed a small erythematous circle about the size of a quarter on the left lower leg just above his ankle. This rapidly expanded and he now has erythema on the lateral and posterior part of the thigh. This is bright red. Also has an area on the dorsal foot just above his toes and a tender area just below the left popliteal fossa. He came off his prophylactic penicillin injections at his own insistence one or 2 months ago. This is obviously deteriorated since then 03/02/18; patient is on doxycycline and Amoxil. Culture I did last week of the weeping area on the back of his left calf grew group B strep. I have therefore renewed the amoxicillin 500 3 times a day for a further week. He has not been systemically unwell. Still complaining of an area of discomfort right under his left popliteal fossa. There is no open wound on the right leg. He tells me that he is using his pumps twice a day on most days 03/09/18; patient arrives in clinic  today completing his amoxicillin today. The cellulitis on his left leg is better. Furthermore he tells me that  he had intramuscular penicillin shots that his primary care office today. However he also states that the wrap on his right leg fell down shortly after leaving clinic last week. He developed a large blister that was present when he came in for a nurse visit later in the week and then he developed intense discomfort around this area.He tells me he is using his compression pumps 03/16/18; the patient has completed his doxycycline. The infectious part of this/cellulitis in the left heel area left popliteal area is a lot better. He has 2 open areas on the right calf. Still areas on the left calf but this is a lot better as well. 03/24/18; the patient arrives complaining of pain in the left popliteal area again. He thinks some of this is wrap injury. He has no open area on the right leg and really no open area on the left calf either except for the popliteal area. He claims to be compliant with the compression pumps 03/31/18; I gave him doxycycline last week because of cellulitis in the left popliteal area. This is a lot better although the surface epithelium is denuded off and response to this. He arrives today with uncontrolled edema in the right calf area as well as a fingernail injury in the right lateral calf. There is only a few open areas on the left 04/06/18; I gave him amoxicillin doxycycline over the last 2 weeks that the amoxicillin should be completing currently. He is not complaining of any pain or systemic symptoms. The only open areas see has is on the right lateral lower leg paradoxically I cannot see anything on the left lower leg. He tells me he is using his compression pumps twice a day on most days. Silver alginate to the wounds that are open under 4 layer compression 04/13/18; he completed antibiotics and has no new complaints. Using his compression pumps. Silver alginate  that anything that's opened 04/20/18; he is using his compression pumps religiously. Silver alginate 4 layer compression anything that's opened. He comes in today with no open wounds on the left leg but 3 on the right including a new one posteriorly. He has 2 on the right lateral and one on the right posterior. He likes Unna boots on the right leg for reasons that aren't really clear we had the usual 4 layer compression on the left. It may be necessary to move to the 4 layer compression on the right however for now I left them in the Unna boots 04/27/18; he is using his compression pumps at least once a day. He has still the wounds on the right lateral calf. The area right posteriorly has closed. He does not have an open wound on the left under 4 layer compression however on the dorsal left foot just proximal to the toes and the left third toe 2 small open areas were identified 05/11/18; he has not uses compression pumps. The areas on the right lateral calf have coalesced into one large wound necrotic surface. On the left side he has one small wound anteriorly however the edema is now weeping out of a large part of his left leg. He says he wasn't using his pumps because of the weeping fluid. I explained to him that this is the time he needs to pump more 05/18/18; patient states he is using his compression pumps twice a day. The area on the right lateral large wound albeit superficial. On the left side he has innumerable number of small new wounds on  the left calf particularly laterally but several anteriorly and medially. All these appear to have healthy granulated base these look like the remnants of blisters however they occurred under compression. The patient arrives in clinic today with his legs somewhat better. There is certainly less edema, less multiple open areas on the left calf and the right anterior leg looks somewhat better as well superficial and a little smaller. However he relates pain  and erythema over the last 3-4 days in the thigh and I looked at this today. He has not been systemically unwell no fever no chills no change in blood sugar values 05/25/18; comes in today in a better state. The severe cellulitis on his left leg seems better with the Keflex. Not as tender. He has not been systemically unwell Hard to find an open wound on the left lower leg using his compression pumps twice a day The confluent wounds on his right lateral calf somewhat better looking. These will ultimately need debridement I didn't do this today. 06/01/18; the severe cellulitis on the left anterior thigh has resolved and he is completed his Keflex. There is no open wound on the left leg however there is a superficial excoriation at the base of the third toe dorsally. Skin on the bottom of his left foot is macerated looking. The left the wounds on the lateral right leg actually looks some better although he did require debridement of the top half of this wound area with an open curet 06/09/18 on evaluation today patient appears to be doing poorly in regard to his right lower extremity in particular this appears to likely be infected he has very thick purulent discharge along with a bright green tent to the discharge. This makes me concerned about the possibility of pseudomonas. He's also having increased discomfort at this point on evaluation. Fortunately there does not appear to be any evidence of infection spreading to the other location at this time. 06/16/18 on evaluation today patient appears to actually be doing fairly well. His ulcer has actually diminished in size quite significantly at this point which is good news. Nonetheless he still does have some evidence of infection he did see infectious disease this morning before coming here for his appointment. I did review the results of their evaluation and their note today. They did actually have him discontinue the Cipro and initiate treatment with  linezolid at this time. He is doing this for the next seven days and they recommended a follow-up in four months with them. He is the keep a log of the need for intermittent antibiotic therapy between now and when he falls back up with infectious disease. This will help them gaze what exactly they need to do to try and help them out. 06/23/18; the patient arrives today with no open wounds on the left leg and left third toe healed. He is been using his compression pumps twice a day. On the right lateral leg he still has a sizable wound but this is a lot better than last time I saw this. 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 Has been using silver alginate under 4 layer compression 06/30/18; the only open wound he has is on the right lateral leg and this looks healthy. No debridement is required. We have been using silver alginate. He does not have an open wound on the left leg. There is apparently some drainage  from the dorsal proximal third toe on the left although I see no open wound here. 07/03/18 on evaluation today patient was actually here just for a nurse visit rapid change. However when he was here on Wednesday for his rat change due to having been healed on the left and then developing blisters we initiated the wrap again knowing that he would be back today for [place] to reevaluate and see were at. Unfortunately he has developed some cellulitis into the proximal portion of his right lower extremity even into the region of his thigh. 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 He was placed on one as what at that point. Nonetheless he is done with that and has been tolerating it well otherwise. Doxycycline which in the past really did not seem to be effective for him. Nonetheless I think the best option may be for [place] to definitely reinitiate the  antibiotics for a longer period of time. 07/07/18; since I last saw this patient a week ago he has had a difficult time. At that point he did not have an open wound on his left leg. We transitioned him into juxta light stockings. He was apparently in the clinic the next day with blisters on the left lateral and left medial lower calf. He also had weeping edema fluid. He was put back into a compression wrap. He was also in the clinic on Friday with intense erythema in his right thigh. Per the patient he was started on Bactrim however that didn't work at all in terms of relieving his pain and swelling. He has taken 3 doxycycline that he had left over from last time and that seems to of helped. He has blistering on the right thigh as well. 07/14/18; the erythema on his right thigh has gotten better with doxycycline that he is finishing. 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 Presumably this cellulitis in the thigh was not related to that although I think this is a bit concerning going forward. He still has an area on the right lateral calf the blister on the right medial calf just below the knee that was discussed above. On the left 2 small open areas left medial and left lateral. Edema control is adequate. He is using his compression pumps twice a day 07/20/18; continued improvement in the condition of both legs especially the edema in his bilateral thighs. He tells me he is been losing weight through a combination of diet and exercise. He is using his compression pumps twice a day. So overall she made to the remaining wounds 07/27/2018; continued improvement in condition of both legs. His edema is well controlled. The area on the right lateral leg is just about closed he had one blisters show up on the medial left upper calf. We have him in 4 layer compression. He is going on a 10-day trip to [place], [place] and  [place]. He will be driving. He wants to wear Unna boots because of the lessening amount of constriction. He will not use compression pumps while he is away 08/05/18 on evaluation today patient actually appears to be doing decently well all things considered in regard to his bilateral lower extremities. The worst ulcer is actually only posterior aspect of his left lower extremity with a four layer compression wrap cut into his leg a couple weeks back. He did have  a trip and actually had [employer] wraps for the trip that he is worn since he was last here. Nonetheless he feels like the [employer] wraps actually do better for him his swelling is up a little bit but he also with his trip was not taking his Lasix on a regular set schedule like he was supposed to be. He states that obviously the reason being that he cannot drive and keep going without having to urinate too frequently which makes it difficult. He did not have his pumps with him while he was away either which I think also maybe playing a role here too. 08/13/2018; the patient only has a small open wound on the right lateral calf which is a big improvement in the last month or 2. He also has the area posteriorly just below the posterior fossa on the left which I think was a wrap injury from several weeks ago. He has no current evidence of cellulitis. He tells me he is back into his compression pumps twice a day. He also tells me that while he was at the laundromat somebody stole a section of his extremitease stockings 08/20/2018; back in the clinic with a much improved state. He only has small areas on the right lateral mid calf which is just about healed. This was is more substantial area for quite a prolonged period of time. He has a small open area on the left anterior tibia. The area on the posterior calf just below the popliteal fossa is closed today. He is using his compression pumps twice a day 08/28/2018; patient has no open wound  on the right leg. He has a smattering of open areas on the calf with some weeping lymphedema. More problematically than that it looks as though his wraps of slipped down in his usual he has very angry upper area of edema just below the right medial knee and on the right lateral calf. He has no open area on his feet. [place] I will send him in an antibiotic. We will continue to wrap the right leg. We ordered extremitease stockings for him last week and I plan to transition the right leg to a stocking when he gets home which will be in 10 days time. As usual he is very reluctant to take his pumps with him when he travels 09/07/2018; [place] He shows me a picture of his left leg in the mid part of his trip last week with intense fire engine erythema. The picture look bad enough I would have considered sending him to the hospital. Instead he went [place] They did not prescribe him antibiotics but he did take some doxycycline he had leftover from a previous visit. I had given him trimethoprim sulfamethoxazole before he left this did not work according to the patient. This is resulted in some improvement fortunately. He comes back with a large wound on the left posterior calf. Smaller area on the left anterior tibia. Denuded blisters on the dorsal left foot over his toes. Does not have much in the way of wounds on the right leg although he does have a very tender area on the right posterior area just below the popliteal fossa also suggestive of infection. He promises me he is back on his pumps twice a day 09/15/2018; the intense cellulitis in his left lower calf is a lot better. The wound area on  the posterior left calf is also so better. However he has reasonably extensive wounds on the dorsal aspect of his second and third toes and the proximal foot just at the base of the toes. There is  nothing open on the right leg 09/22/2018; the patient has excellent edema control in his legs bilaterally. He is using his external compression pumps twice a day. He has no open area on the right leg and only the areas in the left foot dorsally second and third toe area on the left side. He does not have any signs of active cellulitis. 10/06/2018; the patient has good edema control bilaterally. He has no open wound on the right leg. There is a blister in the posterior aspect of his left calf that we had to deal with today. He is using his compression pumps twice a day. There is no signs of active cellulitis. We have been using silver alginate to the wound areas. He still has vulnerable areas on the base of his left first second toes dorsally He has a his extremities stockings and we are going to transition him today into the stocking on the right leg. He is cautioned that he will need to continue to use the compression pumps twice a day. If he notices uncontrolled edema in the right leg he may need to go to 3 times a day. 10/13/2018; the patient came in for a nurse check on Friday he has a large flaccid blister on the right medial calf just below the knee. We unroofed this. He has this and a new area underneath the posterior mid calf which was undoubtedly a blister as well. He also has several small areas on the right which is the area we put his extremities stocking on. 10/19/2018; the patient went to see infectious disease this morning I am not sure if that was a routine follow-up in any case the doxycycline I had given him was discontinued and started on linezolid. He has not started this. It is easy to look at his left calf and the inflammation and think this is cellulitis however he is very tender in the tissue just below the popliteal fossa and I have no doubt that there is infection going on here. He states the problem he is having is that with the compression pumps the edema goes down and  then starts walking the wrap falls down. We will see if we can adhere this. He has 1 or 2 minuscule open areas on the right still areas that are weeping on the posterior left calf, the base of his left second and third toes 10/26/18; back today in clinic with quite of skin breakdown in his left anterior leg. This may have been infection the area below the popliteal fossa seems a lot better however tremendous epithelial loss on the left anterior mid tibia area over quite inexpensive tissue. He has 2 blisters on the right side but no other open wound here. 10/29/2018; came in urgently to see [place] today and we worked him in for review. He states that the 4 layer compression on the right leg caused pain he had to cut it down to roughly his mid calf this caused swelling above the wrap and he has blisters and skin breakdown today. As a result of the pain he has not been using his pumps. Both legs are a lot more edematous and there is a lot of weeping fluid. 11/02/18; arrives in clinic with continued difficulties in the right leg> left.  Leg is swollen and painful. multiple skin blisters and new open areas especially laterally. He has not been using his pumps on the right leg. 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 He is not systemically unwell. 11/09/2018; the patient claims he is being compliant with his pumps. He is finished the doxycycline I gave him last week. Culture I did of the wound on the right lateral leg showed a few very resistant methicillin staph aureus. This was resistant to doxycycline. Nevertheless he states the pain in the leg is a lot better which makes me wonder if the cultured organism was not really what was causing the problem nevertheless this is a very dangerous organism to be culturing out of any wound. His right leg is still a lot larger than the left. He is using an Radio broadcast assistant on this area, he blames a 4-layer compression for  causing the original skin breakdown which I doubt is true however I cannot talk him out of it. We have been using silver alginate to all of these areas which were initially blisters 11/16/2018; patient is being compliant with his external compression pumps at twice a day. Miraculously he arrives in clinic today with absolutely no open wounds. He has better edema control on the left where he has been using 4 layer compression versus wound of wounds on the right and I pointed this out to him. There is no inflammation in the skin in his lower legs which is also somewhat unusual for him. There is no open wounds on the dorsal left foot. He has extremitease stockings at home and I have asked him to bring these in next week. 11/25/18 patient's lower extremity on examination today on the left appears for the most part to be wound free. He does have an open wound on the lateral aspect of the right lower extremity but this is minimal compared to what I've seen in past. He does request that we go ahead and wrap the left leg as well even though there's nothing open just so hopefully it will not reopen in short order. 1/28; patient has superficial open wounds on the right lateral calf left anterior calf and left posterior calf. His edema control is adequate. 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 We have been using silver alginate as the primary dressing. He claims compliance with his compression pumps 2/4; patient has superficial open wounds on numerous areas of his left calf and again one on the left dorsal foot. The areas on the right lateral calf have healed. The cellulitis that I gave him doxycycline for last week is also resolved this was mostly on the left anterior calf just below the tibial tuberosity. His edema looks fairly well-controlled. He tells me he went to see his primary doctor today and had blood work  ordered 2/11; once again he has several open areas on the left calf left tibial area. Most of these are small and appear to have healthy granulation. He does not have anything open on the right. The edema and control in his thighs is pretty good which is usually a good indication he has been using his pumps as requested. 2/18; he continues to have several small areas on the left calf and left tibial area. Most of these are small healthy granulation. We put him in his stocking on the right leg last week and  he arrives with a superficial open area over the right upper tibia and a fairly large area on the right lateral tibia in similar condition. His edema control actually does not look too bad, he claims to be using his compression pumps twice a day 2/25. Continued small areas on the left calf and left tibial area. New areas especially on the right are identified just below the tibial tuberosity and on the right upper tibia itself. There are also areas of weeping edema fluid even without an obvious wound. He does not have a considerable degree of lymphedema but clearly there is more edema here than his skin can handle. He states he is using the pumps twice a day. We have an Unna boot on the right and 4 layer compression on the left. 3/3; he continues to have an area on the right lateral calf and right posterior calf just below the popliteal fossa. There is a fair amount of tenderness around the wound on the popliteal fossa but I did not see any evidence of cellulitis, could just be that the wrap came down and rubbed in this area. He does not have an open area on the left leg however there is an area on the left dorsal foot at the base of the third toe We have been using silver alginate to all wound areas 3/10; he did not have an open area on his left leg last time he was here a week ago. Today he arrives with a horizontal wound just below the tibial tuberosity and an area on the left lateral calf.  He has intense erythema and tenderness in this area. The area is on the right lateral calf and right posterior calf better than last week. We have been using silver alginate as usual 3/18 - Patient returns with 3 small open areas on left calf, and 1 small open area on right calf, the skin looks ok with no significant erythema, he continues the UNA boot on right and 4 layer compression on left. The right lateral calf wound is closed , the right posterior is small area. we will continue silver alginate to the areas. 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 He is been tolerating the dressing changes without complication. Fortunately she has made excellent improvement in regard to the overall status of his wounds. Unfortunately every time we cease wrapping him he ends up reopening in causing more significant issues at that point. Again I'm unsure of the best direction to take although I think the lymphedema clinic may be appropriate for him. 02/03/19 on evaluation today patient appears to be doing well in regard to the wounds that we saw him for last week unfortunately he has a new area on the proximal portion of his right medial/posterior lower extremity where the wrap somewhat slowed down and caused swelling and a blister to rub and open. Unfortunately this is the only opening that he has on either leg at this point. 02/17/19 on evaluation today patient's bilateral lower extremities appear to be doing well. He still completely healed in regard to the left lower extremity. In regard to the right lower extremity the area where the wrap and slid down and caused the blister still seems to be slightly open although this is dramatically better than during the last evaluation two weeks  ago. I'm very pleased with  the way this stands overall. 03/03/19 on evaluation today patient appears to be doing well in regard to his right lower extremity in general although he did have a new blister open this does not appear to be showing any evidence of active infection at this time. Fortunately there's No fevers, chills, nausea, or vomiting noted at this time. Overall I feel like he is making good progress it does feel like that the right leg will we perform the [employer] seems to do with a bit better than three layer wrap on the left which slid down on him. We may switch to doing bilateral in the book wraps. 5/4; I have not seen Mr. Johnson in quite some time. According to our case manager he did not have an open wound on his left leg last week. He had 1 remaining wound on the right posterior medial calf. He arrives today with multiple openings on the left leg probably were blisters and/or wrap injuries from Unna boots. I do not think the Unna boot's will provide adequate compression on the left. I am also not clear about the frequency he is using the compression pumps. 03/17/19 on evaluation today patient appears to be doing excellent in regard to his lower extremities compared to last week's evaluation apparently. He had gotten significantly worse last week which is unfortunate. The [employer] wrap on the left did not seem to do very well for him at all and in fact it didn't control his swelling significantly enough he had an additional outbreak. Subsequently we go back to the four layer compression wrap on the left. This is good news. At least in that he is doing better and the wound seem to be killing him. He still has not heard anything from the lymphedema clinic. 03/24/19 on evaluation today patient actually appears to be doing much better in regard to his bilateral lower Trinity as compared to last week when I saw him. Fortunately there's no signs of active infection at this time. He has been tolerating the  dressing changes without complication. Overall I'm extremely pleased with the progress and appearance in general. 04/07/19 on evaluation today patient appears to be doing well in regard to his bilateral lower extremities. His swelling is significantly down from where it was previous. With that being said he does have a couple blisters still open at this point but fortunately nothing that seems to be too severe and again the majority of the larger openings has healed at this time. 04/14/19 on evaluation today patient actually appears to be doing quite well in regard to his bilateral lower extremities in fact I'm not even sure there's anything significantly open at this time at any site. Nonetheless he did have some trouble with these wraps where they are somewhat irritating him secondary to the fact that he has noted that the graph wasn't too close down to the end of this foot in a little bit short as well up to his knee. Otherwise things seem to be doing quite well. 04/21/19 upon evaluation today patient's wound bed actually showed evidence of being completely healed in regard to both lower extremities which is excellent news. There does not appear to be any signs of active infection which is also good news. I'm very pleased in this regard. No fevers, chills, nausea, or vomiting noted at this time. 04/28/19 on evaluation today patient appears to be doing a little bit worse in regard to both lower extremities  on the left mainly due to the fact that when he went infection disease the wrap was not wrapped quite high enough he developed a blister above this. On the right he is a small open area of nothing too significant but again this is continuing to give him some trouble he has been were in the Velcro compression that he has at home. 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 He's been tolerating the dressing changes without complication.  Fortunately there's no signs of active infection at this time. No fevers, chills, nausea, or vomiting noted at this time. We have been trying to get an appointment with her lymphedema clinic in [facility] but unfortunately nobody can get them on phone with not been able to even fax information over the patient likewise is not been able to get in touch with them. Overall I'm not sure exactly what's going on here with to reach out again today. 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 Still having a lot of drainage unfortunately. He tells me especially in the left but even on the right. There's no signs of active infection which is good news we've been using so ratcheted up to this point. 05/19/19 on evaluation today patient actually appears to be doing quite well with regard to his left lower extremity which is great news. Fortunately in regard to the right lower extremity has an issues with his wrap and he subsequently did remove this from what I'm understanding. Nonetheless long story short is what he had rewrapped once he removed it subsequently had maggots underneath this wrap whenever he came in for evaluation today. With that being said they were obviously completely cleaned away by the nursing staff. The visit today which is excellent news. However he does appear to potentially have some infection around the right ankle region where the maggots were located as well. He will likely require anabiotic therapy today. 05/26/19 on evaluation today patient actually appears to be doing much better in regard to his bilateral lower extremities. I feel like the infection is under much better control. With that being said there were maggots noted when the wrap was removed yet again today. Again this could have potentially been left over from previous although at this time there does not appear to be any  signs of significant drainage there was obviously on the wrap some drainage as well this contracted gnats or otherwise. Either way I do not see anything that appears to be doing worse in my pinion and in fact I think his drainage has slowed down quite significantly likely mainly due to the fact to his infection being under better control. 06/02/2019 on evaluation today patient actually appears to be doing well with regard to his bilateral lower extremities there is no signs of active infection at this time which is great news. With that being said he does have several open areas more so on the right than the left but nonetheless these are all significantly better than previously noted. 06/09/2019 on evaluation today patient actually appears to be doing well. His wrap stayed up and he did not cause any problems he had more drainage on the right compared to the left but overall I do not see any major issues at this time which is great news. 06/16/2019 on evaluation today patient appears to be doing excellent with regard  to his lower extremities the only area that is open is a new blister that can have opened as of today on the medial ankle on the left. Other than this he really seems to be doing great I see no major issues at this point. 06/23/2019 on evaluation today patient appears to be doing quite well with regard to his bilateral lower extremities. In fact he actually appears to be almost completely healed there is a small area of weeping noted of the right lower extremity just above the ankle. Nonetheless fortunately there is no signs of active infection at this time which is good news. No fevers, chills, nausea, vomiting, or diarrhea. 8/24; the patient arrived for a nurse visit today but complained of very significant pain in the left leg and therefore I was asked to look at this. Noted that he did not have an open area on the left leg last week nevertheless this was wrapped. The patient states  that he is not been able to put his compression pumps on the left leg because of the discomfort. He has not been systemically unwell 06/30/2019 on evaluation today patient unfortunately despite being excellent last week is doing much worse with regard to his left lower extremity today. In fact he had to come in for a nurse on Monday where his left leg had to be rewrapped due to excessive weeping Dr. Leanord Hawking placed him on doxycycline at that point. Fortunately there is no signs of active infection Systemically at this time which is good news. 07/07/2019 in regard to the patient's wounds today he actually seems to be doing well with his right lower extremity there really is nothing open or draining at this point this is great news. Unfortunately the left lower extremity is given him additional trouble at this time. There does not appear to be any signs of active infection nonetheless he does have a lot of edema and swelling noted at this point as well as blistering all of which has led to a much more poor appearing leg at this time compared to where it was 2 weeks ago when it was almost completely healed. Obviously this is a little discouraging for the patient. He is try to contact the lymphedema clinic in [place] he has not been able to get through to them. 07/14/2019 on evaluation today patient actually appears to be doing slightly better with regard to his left lower extremity ulcers. Overall I do feel like at least at the top of the wrap that we have been placing this area has healed quite nicely and looks much better. The remainder of the leg is showing signs of improvement. Unfortunately in the thigh area he still has an open region on the left and again on the right he has been utilizing just a Band-Aid on an area that also opened on the thigh. Again this is an area that were not able to wrap although we did do an Ace wrap to provide some compression that something that obviously is a little  less effective than the compression wraps we have been using on the lower portion of the leg. He does have an appointment with the lymphedema clinic in [facility] on Friday. 07/21/2019 on evaluation today patient appears to be doing better with regard to his lower extremity ulcers. He has been tolerating the dressing changes without complication. Fortunately there is no signs of active infection at this time. No fevers, chills, nausea, vomiting, or diarrhea. I did receive the paperwork from the  physical therapist [place]. Subsequently I signed off on that this morning and sent that back to him for further progression with the treatment plan. 07/28/2019 on evaluation today patient appears to be doing very well with regard to his right lower extremity where I do not see any open wounds at this point. Fortunately he is feeling great as far as that is concerned as well. In regard to the left lower extremity he has been having issues with still several areas of weeping and edema although the upper leg is doing better his lower leg still I think is going require the compression wrap at this time. No fevers, chills, nausea, vomiting, or diarrhea. 08/04/2019 on evaluation today patient unfortunately is having new wounds on the right lower extremity. Again we have been using Unna boot wrap on that side. We switched him to using his juxta lite wrap at home. With that being said he tells me he has been using it although his legs extremely swollen and to be honest really does not appear that he has been. I cannot know that for sure however. Nonetheless he has multiple new wounds on the right lower extremity at this time. Obviously we will have to see about getting this rewrapped for him today. 08/11/2019 on evaluation today patient appears to be doing fairly well with regard to his wounds. He has been tolerating the dressing changes including the compression wraps without  complication. He still has a lot of edema in his upper thigh regions bilaterally he is supposed to be seeing the lymphedema clinic on the 15th of this month once his wraps arrive for the upper part of his legs. 08/18/2019 on evaluation today patient appears to be doing well with regard to his bilateral lower extremities at this point. He has been tolerating the dressing changes without complication. Fortunately there is no signs of active infection which is also good news. He does have a couple weeping areas on the first and second toe of the right foot he also has just a small area on the left foot upper leg and a small area on the left lower leg but overall he is doing quite well in my opinion. He is supposed to be getting his wraps shortly in fact tomorrow and then subsequently is seeing the lymphedema clinic next Wednesday on the 21st. Of note he is also leaving on the 25th to go on vacation for a week to the beach. For that reason and since there is some uncertainty about what there can be doing at lymphedema clinic next Wednesday I am get a make an appointment for next Friday here for us to see what we need to do for him prior to him leaving for vacation. 10/23; patient arrives in considerable pain predominantly in the upper posterior calf just distal to the popliteal fossa also in the wound anteriorly above the major wound. This is probably cellulitis and he has had this recurrently in the past. He has no open wound on the right side and he has had an Radio broadcast assistantUnna boot in that area. Finally I note that he has an area on the left posterior calf which by enlarge is mostly epithelialized. This protrudes beyond the borders of the surrounding skin in the setting of dry scaly skin and lymphedema. [place] Per his longstanding pattern, he will not take his compression pumps with him predominantly out of fear that they will be stolen. He  therefore asked that we put  a Unna boot back on the right leg. He will also contact the wound care center in [facility] to see if they can change his dressing in the mid week. 11/3; patient returned from his vacation to [facility]. He was seen on 1 occasion at their wound care center. They did a 2 layer compression system as they did not have our 4-layer wrap. I am not completely certain what they put on the wounds. They did not change the Unna boot on the right. The patient is also seeing a lymphedema specialist physical therapist in [place]. It appears that he has some compression sleeve for his thighs which indeed look quite a bit better than I am used to seeing. He pumps over these with his external compression pumps. 11/10; the patient has a new wound on the right medial thigh otherwise there is no open areas on the right. He has an area on the left leg posteriorly anteriorly and medially and an area over the left second toe. We have been using silver alginate. He thinks the injury on his thigh is secondary to friction from the compression sleeve he has. 11/17; the patient has a new wound on the right medial thigh last week. He thinks this is because he did not have a underlying stocking for his thigh juxta lite apparatus. He now has this. The area is fairly large and somewhat angry but I do not think he has underlying cellulitis. He has a intact blister on the right anterior tibial area. Small wound on the right great toe dorsally Small area on the medial left calf. 11/30; the patient does not have any open areas on his right leg and we did not take his juxta lite stocking off. However he states that on Friday his compression wrap fell down lodging around his upper mid calf area. As usual this creates a lot of problems for him. He called urgently today to be seen for a nurse visit however the nurse visit turned into a provider visit because of extreme erythema and pain in the left anterior tibia extending  laterally and posteriorly. The area that is problematic is extensive 10/06/2019 upon evaluation today patient actually appears to be doing poorly in regard to his left lower extremity. He Dr. Leanord Hawking did place him on doxycycline this past Monday apparently due to the fact that he was doing much worse in regard to this left leg. Fortunately the doxycycline does seem to be helping. Unfortunately we are still having a very difficult time getting his edema under any type of control in order to anticipate discharge at some point. The only way were really able to control his lymphedema really is with compression wraps and that has only even seemingly temporary. He has been seeing a lymphedema clinic they are trying to help in this regard but still this has been somewhat frustrating in general for the patient. 10/13/19 on evaluation today patient appears to be doing excellent with regard to his right lower extremity as far as the wounds are concerned. His swelling is still quite extensive unfortunately. He is still having a lot of drainage from the thigh areas bilaterally which is unfortunate. He's been going to lymphedema clinic but again he still really does not have this edema under control as far as his lower extremities are concern. With regard to his left lower extremity this seems to be improving and I do believe the doxycycline has been of benefit for him.  He is about to complete the doxycycline. 10/20/2019 on evaluation today patient appears to be doing poorly in regard to his bilateral lower extremities. More in the right thigh he has a lot of irritation at this site unfortunately. In regard to the left lower extremity the wrap was not quite as high it appears and does seem to have caused him some trouble as well. Fortunately there is no evidence of systemic infection though he does have some blue-green drainage which has me concerned for the possibility of Pseudomonas. He tells me he is  previously taking Cipro without complications and he really does not care for Levaquin however due to some of the side effects he has. He is not allergic to any medications specifically antibiotics that were aware of. 10/27/2019 on evaluation today patient actually does appear to be for the most part doing better when compared to last week's evaluation. With that being said he still has multiple open wounds over the bilateral lower extremities. He actually forgot to start taking the Cipro and states that he still has the whole bottle. He does have several new blisters on left lower extremity today I think I would recommend he go ahead and take the Cipro based on what I am seeing at this point. 12/30-Patient comes at 1 week visit, 4 layer compression wraps on the left and Unna boot on the right, primary dressing Xtrasorb and silver alginate. Patient is taking his Cipro and has a few more days left probably 5-6, and the legs are doing better. He states he is using his compressions devices which I believe he has 11/10/2019 on evaluation today patient actually appears to be much better than last time I saw him 2 weeks ago. His wounds are significantly improved and overall I am very pleased in this regard. Fortunately there is no signs of active infection at this time. He is just a couple of days away from completing Cipro. Overall his edema is much better he has been using his lymphedema pumps which I think is also helping at this point. 11/17/2019 on evaluation today patient appears to be doing excellent in regard to his wounds in general. His legs are swollen but not nearly as much as they have been in the past. Fortunately he is tolerating the compression wraps without complication. No fevers, chills, nausea, vomiting, or diarrhea. He does have some erythema however in the distal portion of his right lower extremity specifically around the forefoot and toes there is a little bit of warmth here as  well. Electronic Signature(s) Signed: 11/17/2019 10:56:41 AM By: Lenda Kelp PA-C Entered By: Lenda Kelp on 11/17/2019 10:56:41 -------------------------------------------------------------------------------- Physical Exam Details Patient Name: Date of Service: [name], [name] 11/17/2019 10:15 AM Medical Record [id number] Patient Account Number: [id number] Date of Birth/Sex: Treating RN: [birth year] (70 y.o. M) Primary Care Provider: Nicoletta Ba Other Clinician: Referring Provider: Treating Provider/Extender:[name], [name], [name] in Treatment: 199 Constitutional Well-nourished and well-hydrated in no acute distress. Respiratory normal breathing without difficulty. Psychiatric this patient is able to make decisions and demonstrates good insight into disease process. Alert and Oriented x 3. pleasant and cooperative. Notes Though the patient seems to be doing quite well with regard to his wounds in general I am concerned about the possibility of infection at this time. For that reason I discussed with the patient that we may want to consider going ahead and placing him back on the Cipro for some time. He is actually in agreement with  that plan. Especially in light of the fact that he is concerned about the infection worsening obviously. No sharp debridement was necessary nor performed today. Electronic Signature(s) Signed: 11/17/2019 10:57:03 AM By: Lenda Kelp PA-C Entered By: Lenda Kelp on 11/17/2019 10:57:03 -------------------------------------------------------------------------------- Physician Orders Details Patient Name: Date of Service: [name], [name] 11/17/2019 10:15 AM Medical Record [id number] Patient Account Number: [id number] Date of Birth/Sex: Treating RN: [birth year] (69 y.o. 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 Dressing Change Frequency Do not change entire dressing for one week. - both lower legs Skin Barriers/Peri-Wound Care Moisturizing lotion TCA Cream or Ointment - to legs mixed with lotion Wound Cleansing May shower with protection. Primary Wound Dressing Wound #145R Right,Medial Upper Leg Calcium Alginate with Silver Wound #154 Left,Medial Lower Leg Calcium Alginate with Silver Wound #155 Right,Distal,Medial Lower Leg Calcium Alginate with Silver Secondary Dressing Wound #145R Right,Medial Upper Leg ABD pad - secured with tape Wound #154 Left,Medial Lower Leg ABD pad Wound #155 Right,Distal,Medial Lower Leg ABD pad Edema Control 4 layer compression: Left lower extremity - unna boot at upper portion of lower leg. Unna Boot to Right Lower Extremity Avoid standing for long periods of time Elevate legs to the level of the heart or above for 30 minutes daily and/or when sitting, a frequency of: - throughout the day Exercise regularly Segmental Compressive Device. - lymphadema pumps 60 min 2 times per day Other: - lymphadema wraps to upper leg per [facility] clinic Patient Medications Allergies: No Known Allergies Notifications Medication Indication Start End Cipro 11/17/2019 DOSE 1 - oral 500 mg tablet - 1 tablet oral taken 2 times a day for 14  days Electronic Signature(s) Signed: 11/17/2019 10:59:08 AM By: Lenda Kelp PA-C Entered By: Lenda Kelp on 11/17/2019 10:59:02 -------------------------------------------------------------------------------- Problem List Details Patient Name: Date of Service: [name] 11/17/2019 10:15 AM Medical Record [id number] Patient Account Number: [id number] Date of Birth/Sex: Treating RN: 1951/01/16 (69 y.o. M) Primary Care Provider: Nicoletta Ba Other Clinician: Referring Provider: Treating Provider/Extender:[name], [name], [name] in Treatment: 199 Active Problems ICD-10 Evaluated Encounter Code Description Active Date Today Diagnosis L97.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 right calf limited to 06/30/2018 No Yes breakdown of skin L97.221 Non-pressure chronic ulcer of left calf limited to 09/30/2016 No Yes breakdown of skin I87.333 Chronic venous hypertension (idiopathic) with ulcer 01/22/2016 No Yes and inflammation of bilateral lower extremity I89.0 Lymphedema, not elsewhere classified 01/22/2016 No Yes E11.622 Type 2 diabetes mellitus with other skin ulcer 01/22/2016 No Yes 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, 01/22/2016 No Yes unspecified L03.116 Cellulitis of left lower limb 04/01/2017 No Yes Inactive Problems ICD-10 Code Description Active Date Inactive Date 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 06/30/2017 skin L97.521 Non-pressure chronic ulcer of other part of left foot limited to 04/27/2018 04/27/2018 breakdown of skin L03.115 Cellulitis of right lower limb 12/22/2017 12/22/2017  Z61.096 Non-pressure chronic ulcer of left calf with other specified 06/30/2018 06/30/2018 severity L97.511 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 06/30/2018 06/30/2018 breakdown of skin Resolved Problems Electronic Signature(s) Signed: 11/17/2019 10:40:16 AM By: Lenda Kelp  PA-C Entered By: Lenda Kelp on 11/17/2019 10:40:15 -------------------------------------------------------------------------------- Progress Note Details Patient Name: Date of Service: REILY, ILIC 11/17/2019 10:15 AM Medical Record [id number] Patient Account Number: [id number] Date of Birth/Sex: Treating RN: 06-25-1951 (69 y.o. M) Primary Care Provider: Nicoletta Ba Other Clinician: Referring Provider: Treating Provider/Extender:[name], [name], [name] in Treatment: 199 Subjective Chief Complaint Information obtained from Patient patient is here for evaluation venous/lymphedema weeping History of Present Illness (HPI) Referred by PCP for consultation. Patient has long standing history of BLE venous stasis, no prior ulcerations. At beginning of month, developed cellulitis and weeping. Received IM Rocephin followed by Keflex and resolved. Wears compression stocking, appr 6 months old. Not sure strength. No present drainage. 01/22/16 this is a patient who is a type II diabetic on insulin. 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 He tells me he religiously wears pressure stockings of uncertain strength. He was here with weeping edema about 8 months ago but did not have an open wound. Roughly a month ago he had a reopening on his bilateral legs. He is been using bandages and Neosporin. He does not complain of pain. 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 Last BUN/creatinine I have is from 11/20/15 at 13 and 1.0 respectively 01/29/16; patient arrives today having tolerated the Profore wrap. He brought in his stockings and these are 18 mmHg stockings he bought from [employer]. The compression here is likely inadequate. He does not complain of pain or excessive drainage she has no systemic symptoms. The wound on the right  looks improved as does the one on the left although one on the left is more substantial with still tissue at risk below the actual wound area on the bilateral posterior calf 02/05/16; patient arrives with poor edema control. He states that we did put a 4 layer compression on it last week. No weight appear 5 this. 02/12/16; the area on the posterior right Has healed. The left Has a substantial wound that has necrotic surface eschar that requires a debridement with a curette. 02/16/16;the patient called or a Nurse visit secondary to increased swelling. He had been in earlier in the week with his right leg healed. He was transitioned to is on pressure stocking on the right leg with the only open wound on the left, a substantial area on the left posterior calf. Note he has a history of severe lower extremity edema, he has a history of chronic atrial fibrillation but not heart failure per my notes but I'll need to research this. He is not complaining of chest pain shortness of breath or orthopnea. The intake nurse noted blisters on the previously closed right leg 02/19/16; this is the patient's regular visit day. I see him on Friday with escalating edema new wounds on the right leg and clear signs of at least right ventricular heart failure. I increased his Lasix to 40 twice a day. He is returning currently in follow-up. States he is noticed a decrease in that the edema 02/26/16 patient's legs have much less edema. There is nothing really open on the right leg. The left leg has  improved condition of the large superficial wound on the posterior left leg 03/04/16; edema control is very much better. The patient's right leg wounds have healed. On the left leg he continues to have severe venous inflammation on the posterior aspect of the left leg. There is no tenderness and I don't think any of this is cellulitis. 03/11/16; patient's right leg is married healed and he is in his own stocking. The patient's left leg  has deteriorated somewhat. There is a lot of erythema around the wound on the posterior left leg. There is also a significant rim of erythema posteriorly just above where the wrap would've ended there is a new wound in this location and a lot of tenderness. Can't rule out cellulitis in this area. 03/15/16; patient's right leg remains healed and he is in his own stocking. The patient's left leg is much better than last review. His major wound on the posterior aspect of his left Is almost fully epithelialized. He has 3 small injuries from the wraps. Really. Erythema seems a lot better on antibiotics 03/18/16; right leg remains healed and he is in his own stocking. The patient's left leg is much better. The area on the posterior aspect of the left calf is fully epithelialized. His 3 small injuries which were wrap injuries on the left are improved only one seems still open his erythema has resolved 03/25/16; patient's right leg remains healed and he is in his own stocking. There is no open area today on the left leg posterior leg is completely closed up. His wrap injuries at the superior aspect of his leg are also resolved. He looks as though he has some irritation on the dorsal ankle but this is fully epithelialized without evidence of infection. 03/28/16; we discharged this patient on Monday. Transitioned him into his own stocking. There were problems almost immediately with uncontrolled swelling weeping edema multiple some of which have opened. He does not feel systemically unwell in particular no chest pain no shortness of breath and he does not feel 04/08/16; the edema is under better control with the Profore light wrap but he still has pitting edema. There is one large wound anteriorly 2 on the medial aspect of his left leg and 3 small areas on the superior posterior calf. Drainage is not excessive he is tolerating a Profore light well 04/15/16; put a Profore wrap on him last week. This is controlled  is edema however he had a lot of pain on his left anterior foot most of his wounds are healed 04/22/16 once again the patient has denuded areas on the left anterior foot which he states are because his wrap slips up word. He saw his primary physician today is on Lasix 40 twice a day and states that he his weight is down 20 pounds over the last 3 months. 04/29/16: Much improved. left anterior foot much improved. He is now on Lasix 80 mg per day. Much improved edema control 05/06/16; I was hoping to be able to discharge him today however once again he has blisters at a low level of where the compression was placed last week mostly on his left lateral but also his left medial leg and a small area on the anterior part of the left foot. 05/09/16; apparently the patient went home after his appointment on 7/4 later in the evening developing pain in his upper medial thigh together with subjective fever and chills although his temperature was not taken. The pain was so intense he felt  he would probably have to call 911. However he then remembered that he had leftover doxycycline from a previous round of antibiotics and took these. By the next morning he felt a lot better. He called and spoke to one of our nurses and I approved doxycycline over the phone thinking that this was in relation to the wounds we had previously seen although they were definitely were not. The patient feels a lot better old fever no chills he is still working. Blood sugars are reasonably controlled 05/13/16; patient is back in for review of his cellulitis on his anterior medial upper thigh. He is taking doxycycline this is a lot better. Culture I did of the nodular area on the dorsal aspect of his foot grew MRSA this also looks a lot better. 05/20/16; the patient is cellulitis on the medial upper thigh has resolved. All of his wound areas including the left anterior foot, areas on the medial aspect of the left calf and the lateral aspect  of the calf at all resolved. He has a new blister on the left dorsal foot at the level of the fourth toe this was excised. No evidence of infection 05/27/16; patient continues to complain weeping edema. He has new blisterlike wounds on the left anterior lateral and posterior lateral calf at the top of his wrap levels. The area on his left anterior foot appears better. He is not complaining of fever, pain or pruritus in his feet. 05/30/16; the patient's blisters on his left anterior leg posterior calf all look improved. He did not increase the Lasix 100 mg as I suggested because he was going to run out of his 40 mg tablets. He is still having weeping edema of his toes 06/03/16; I renewed his Lasix at 80 mg once a day as he was about to run out when I last saw him. He is on 80 mg of Lasix now. I have asked him to cut down on the excessive amount of water he was drinking and asked him to drink according to his thirst mechanisms 06/12/2016 -- was seen 2 days ago and was supposed to wear his compression stockings at home but he is developed lymphedema and superficial blisters on the left lower extremity and hence came in for a review 06/24/16; the remaining wound is on his left anterior leg. He still has edema coming from between his toes. There is lymphedema here however his edema is generally better than when I last saw this. He has a history of atrial fibrillation but does not have a known history of congestive heart failure nevertheless I think he probably has this at least on a diastolic basis. 07/01/16 I reviewed his echocardiogram from January 2017. This was essentially normal. He did not have LVH, EF of 55-60%. His right ventricular function was normal although he did have trivial tricuspid and pulmonic regurgitation. This is not audible on exam however. I increased his Lasix to do massive edema in his legs well above his knees I think in early July. He was also drinking an excessive amount of  water at the time. 07/15/16; missed his appointment last week because of the Labor Day holiday on Monday. He could not get another appointment later in the week. Started to feel the wrap digging in superiorly so we remove the top half and the bottom half of his wrap. He has extensive erythema and blistering superiorly in the left leg. Very tender. Very swollen. Edema in his foot with leaking edema fluid. He  has not been systemically unwell 07/22/16; the area on the left leg laterally required some debridement. The medial wounds look more stable. His wrap injury wounds appear to have healed. Edema and his foot is better, weeping edema is also better. He tells me he is meeting with the supplier of the external compression pumps at work 08/05/16; the patient was on vacation last week in [facility]. His wrap is been on for an extended period of time. Also over the weekend he developed an extensive area of tender erythema across his anterior medial thigh. He took to doxycycline yesterday that he had leftover from a previous prescription. The patient complains of weeping edema coming out of his toes 08/08/16; I saw this patient on 10/2. He was tender across his anterior thigh. I put him on doxycycline. He returns today in follow-up. He does not have any open wounds on his lower leg, he still has edema weeping into his toes. 08/12/16; patient was seen back urgently today to follow-up for his extensive left thigh cellulitis/erysipelas. He comes back with a lot less swelling and erythema pain is much better. I believe I gave him Augmentin and Cipro. His wrap was cut down as he stated a roll down his legs. He developed blistering above the level of the wrap that remained. He has 2 open blisters and 1 intact. 08/19/16; patient is been doing his primary doctor who is increased his Lasix from 40-80 once a day or 80 already has less edema. Cellulitis has remained improved in the left thigh. 2 open areas on the  posterior left calf 08/26/16; he returns today having new open blisters on the anterior part of his left leg. He has his compression pumps but is not yet been shown how to use some vital representative from the supplier. 09/02/16 patient returns today with no open wounds on the left leg. Some maceration in his plantar toes 09/10/2016 -- Dr. Leanord Hawking had recently discharged him on 09/02/2016 and he has come right back with redness swelling and some open ulcers on his left lower extremity. He says this was caused by trying to apply his compression stockings and he's been unable to use this and has not been able to use his lymphedema pumps. He had some doxycycline leftover and he has started on this a few days ago. 09/16/16; there are no open wounds on his leg on the left and no evidence of cellulitis. 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 He does not complain of symptoms here. I am not clear use using his external compression pumps. 09/23/16; I have not seen this patient in 2 weeks. He canceled his appointment 10 days ago as he was going on vacation. He tells me that on Monday he noticed a large area on his posterior left leg which is been draining copiously and is reopened into a large wound. He is been using ABDs and the external part of his juxtalite, according to our nurse this was not on properly. 10/07/16; Still a substantial area on the posterior left leg. Using silver alginate 10/14/16; in general better although there is still open area which looks healthy. Still using silver alginate. He reminds me that this happen before he left for [facility]. Today while he was showering in the morning. He had been using his juxtalite's 10/21/16; the area on his posterior left leg is fully epithelialized. However he arrives today with a large area of tender erythema in  his medial and posterior left thigh just above the knee. I have marked the  area. Once again he is reluctant to consider hospitalization. I treated him with oral antibiotics in the past for a similar situation with resolution I think with doxycycline however this area it seems more extensive to me. He is not complaining of fever but does have chills and says states he is thirsty. His blood sugar today was in the 140s at home 10/25/16 the area on his posterior left leg is fully epithelialized although there is still some weeping edema. The large area of tenderness and erythema in his medial and posterior left thigh is a lot less tender although there is still a lot of swelling in this thigh. He states he feels a lot better. He is on doxycycline and Augmentin that I started last week. This will continued until Tuesday, December 26. I have ordered a duplex ultrasound of the left thigh rule out DVT whether there is an abscess something that would need to be drained I would also like to know. 11/01/16; he still has weeping edema from a not fully epithelialized area on his left posterior calf. Most of the rest of this looks a lot better. He has completed his antibiotics. His thigh is a lot better. Duplex ultrasound did not show a DVT in the thigh 11/08/16; he comes in today with more Denuded surface epithelium from the posterior aspect of his calf. There is no real evidence of cellulitis. The superior aspect of his wrap appears to have put quite an indentation in his leg just below the knee and this may have contributed. He does not complain of pain or fever. We have been using silver alginate as the primary dressing. The area of cellulitis in the right thigh has totally resolved. He has been using his compression stockings once a week 11/15/16; the patient arrives today with more loss of epithelium from the posterior aspect of his left calf. He now has a fairly substantial wound in this area. The reason behind this deterioration isn't exactly clear although his edema is not well  controlled. He states he feels he is generally more swollen systemically. He is not complaining of chest pain shortness of breath fever. Tells me he has an appointment with his primary physician in early February. He is on 80 mg of oral Lasix a day. He claims compliance with the external compression pumps. He is not having any pain in his legs similar to what he has with his recurrent cellulitis 11/22/16; the patient arrives a follow-up of his large area on his left lateral calf. This looks somewhat better today. He came in earlier in the week for a dressing change since I saw him a week ago. He is not complaining of any pain no shortness of breath no chest pain 11/28/16; the patient arrives for follow-up of his large area on the left lateral calf this does not look better. In fact it is larger weeping edema. The surface of the wound does not look too bad. We have been using silver alginate although I'm not certain that this is a dressing issue. 12/05/16; again the patient follows up for a large wound on the left lateral and left posterior calf this does not look better. There continues to be weeping edema necrotic surface tissue. More worrisome than this once again there is erythema below the wound involving the distal Achilles and heel suggestive of cellulitis. He is on his feet working most of the day  of this is not going well. We are changing his dressing twice a week to facilitate the drainage. 12/12/16; not much change in the overall dimensions of the large area on the left posterior calf. This is very inflamed looking. I gave him an. Doxycycline last week does not really seem to have helped. He found the wrap very painful indeed it seems to of dog into his legs superiorly and perhaps around the heel. He came in early today because the drainage had soaked through his dressings. 12/19/16- patient arrives for follow-up evaluation of his left lower extremity ulcers. He states that he is using  his lymphedema pumps once daily when there is "no drainage". He admits to not using his lipedema pumps while under current treatment. His blood sugars have been consistently between 150-200. 12/26/16; the patient is not using his compression pumps at home because of the wetness on his feet. I've advised him that I think it's important for him to use this daily. He finds his feet too wet, he can put a plastic bag over his legs while he is in the pumps. Otherwise I think will be in a vicious circle. We are using silver alginate to the major area on his left posterior calf 01/02/17; the patient's posterior left leg has further of all into 3 open wounds. All of them covered with a necrotic surface. He claims to be using his compression pumps once a day. His edema control is marginal. Continue with silver alginate 01/10/17; the patient's left posterior leg actually looks somewhat better. There is less edema, less erythema. Still has 3 open areas covered with a necrotic surface requiring debridement. He claims to be using his compression pumps once a day his edema control is better 01/17/17; the patient's left posterior calf look better last week when I saw him and his wrap was changed 2 days ago. He has noted increasing pain in the left heel and arrives today with much larger wounds extensive erythema extending down into the entire heel area especially tender medially. He is not systemically unwell CBGs have been controlled no fever. Our intake nurse showed me limegreen drainage on his AVD pads. 01/24/17; his usual this patient responds nicely to antibiotics last week giving him Levaquin for presumed Pseudomonas. The whole entire posterior part of his leg is much better much less inflamed and in the case of his Achilles heel area much less tender. He has also had some epithelialization posteriorly there are still open areas here and still draining but overall considerably better 01/31/17- He has continue to  tolerate the compression wraps. he states that he continues to use the lymphedema pumps daily, and can increase to twice daily on the weekends. He is voicing no complaints or concerns regarding his LLE ulcers 02/07/17-he is here for follow-up evaluation. He states that he noted some erythema to the left medial and anterior thigh, which he states is new as of yesterday. He is concerned about recurrent cellulitis. He states his blood sugars have been slightly elevated, this morning in the 180s 02/14/17; he is here for follow-up evaluation. When he was last here there was erythema superiorly from his posterior wound in his anterior thigh. He was prescribed Levaquin however a culture of the wound surface grew MRSA over the phone I changed him to doxycycline on Monday and things seem to be a lot better. 02/24/17; patient missed his appointment on Friday therefore we changed his nurse visit into a physician visit today. Still using silver alginate  on the large area of the posterior left thigh. He isn't new area on the dorsal left second toe 03/03/17; actually better today although he admits he has not used his external compression pumps in the last 2 days or so because of work responsibilities over the weekend. 03/10/17; continued improvement. External compression pumps once a day almost all of his wounds have closed on the posterior left calf. Better edema control 03/17/17; in general improved. He still has 3 small open areas on the lateral aspect of his left leg however most of the area on the posterior part of his leg is epithelialized. He has better edema control. He has an ABD pad under his stocking on the right anterior lower leg although he did not let us look at that today. 03/24/17; patient arrives back in clinic today with no open areas however there are areas on the posterior left calf and anterior left calf that are less than 100% epithelialized. His edema is well controlled in the left lower leg.  There is some pitting edema probably lymphedema in the left upper thigh. He uses compression pumps at home once per day. I tried to get him to do this twice a day although he is very reticent. 04/01/2017 -- for the last 2 days he's had significant redness, tenderness and weeping and came in for an urgent visit today. 04/07/17; patient still has 6 more days of doxycycline. He was seen by Dr. Meyer Russel last Wednesday for cellulitis involving the posterior aspect, lateral aspect of his Involving his heel. For the most part he is better there is less erythema and less weeping. He has been on his feet for 12 hours o2 over the weekend. Using his compression pumps once a day 04/14/17 arrives today with continued improvement. Only one area on the posterior left calf that is not fully epithelialized. He has intense bilateral venous inflammation associated with his chronic venous insufficiency disease and secondary lymphedema. We have been using silver alginate to the left posterior calf wound In passing he tells [place] today that the right leg but we have not seen in quite some time has an open area on it but he doesn't want [place] to look at this today states he will show this to [place] next week. 04/21/17; there is no open area on his left leg although he still reports some weeping edema. He showed [place] his right leg today which is the first time we've seen this leg in a long time. He has a large area of open wound on the right leg anteriorly healthy granulation. Quite a bit of swelling in the right leg and some degree of venous inflammation. He told us about the right leg in passing last week but states that deterioration in the right leg really only happened over the weekend 04/28/17; there is no open area on the left leg although there is an irritated part on the posterior which is like a wrap injury. The wound on the right leg which was new from last week at least to [place] is a lot better. 05/05/17; still no open area on  the left leg. Patient is using his new compression stocking which seems to be doing a good job of controlling the edema. He states he is using his compression pumps once per day. The right leg still has an open wound although it is better in terms of surface area. Required debridement. A lot of pain in the posterior right Achilles marked tenderness. Usually this type of  presentation this patient gives concern for an active cellulitis 05/12/17; patient arrives today with his major wound from last week on the right lateral leg somewhat better. Still requiring debridement. He was using his compression stocking on the left leg however that is reopened with superficial wounds anteriorly he did not have an open wound on this leg previously. He is still using his juxta light's once daily at night. He cannot find the time to do this in the morning as he has to be at work by 7 AM 05/19/17; right lateral leg wound looks improved. No debridement required. The concerning area is on the left posterior leg which appears to almost have a subcutaneous hemorrhagic component to it. We've been using silver alginate to all the wounds 05/26/17; the right lateral leg wound continues to look improved. However the area on the left posterior calf is a tightly adherent surface. [place] using silver alginate. Because of the weeping edema in his legs there is very little good alternatives. 06/02/17; the patient left here last week looking quite good. Major wound on the left posterior calf and a small one on the right lateral calf. Both of these look satisfactory. He tells me that by Wednesday he had noted increased pain in the left leg and drainage. He called on Thursday and Friday to get an appointment here but we were blocked. He did not go to urgent care or his primary physician. He thinks he had a fever on Thursday but did not actually take his temperature. He has not been using his compression pumps on the left leg because  of pain. I advised him to go to the emergency room today for IV antibiotics for stents of left leg cellulitis but he has refused I have asked him to take 2 days off work to keep his leg elevated and he has refused this as well. In view of this I'm going to call him and Augmentin and doxycycline. 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 However after a detailed discussion regarding the risk benefits and alternatives he refuses to get admitted to the hospital. With no other recourse I will continue him on oral antibiotics as before and hopefully he'll have his infectious disease consultation this week. 06/16/2017 -- the patient was seen today by the nurse practitioner at infectious disease Ms. Dixon. 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 He has also been advise Lotrimin cream locally. she also had wise IV antibiotics for his condition if it flares up 06/23/17; patient arrives today with drainage bilaterally although the remaining wound on the left posterior calf after cleaning up today "highlighter yellow drainage" did not look too bad. Unfortunately he has had breakdown on the right anterior leg [previously this leg had not been open and he is using a black stocking] he went to see infectious disease and is been put on clindamycin 150 mg daily, I did not verify the dose although I'm not familiar with using clindamycin in this dosing range, perhaps for prophylaxisoo 06/27/17; I brought this patient back today to follow-up on  the wound deterioration on the right lower leg  together with surrounding cellulitis. I started him on doxycycline 4 days ago. This area looks better however he comes in today with intense cellulitis on the medial part of his left thigh. This is not have a wound in this area. Extremely tender. We've been using silver alginate to the wounds on the right lower leg left lower leg with bilateral 4 layer compression he is using his external compression pumps once a day 07/04/17; patient's left medial thigh cellulitis looks better. He has not been using his compression pumps as his insert said it was contraindicated with cellulitis. His right leg continues to make improvements all the wounds are still open. We only have one remaining wound on the left posterior calf. Using silver alginate to all open areas. He is on doxycycline which I started a week ago and should be finishing I gave him Augmentin after Thursday's visit for the severe cellulitis on the left medial thigh which fortunately looks better 07/14/17; the patient's left medial thigh cellulitis has resolved. The cellulitis in his right lower calf on the right also looks better. All of his wounds are stable to improved we've been using silver alginate he has completed the antibiotics I have given him. He has clindamycin 150 mg once a day prescribed by infectious disease for prophylaxis, I've advised him to start this now. We have been using bilateral Unna boots over silver alginate to the wound areas 07/21/17; the patient is been to see infectious disease who noted his recurrent problems with cellulitis. He was not able to tolerate prophylactic clindamycin therefore he is on amoxicillin 500 twice a day. He also had a second daily dose of Lasix added By Dr. Oneta Rack but he is not taking this. Nor is he being completely compliant with his compression pumps a especially not this week. He has 2 remaining wounds one on the right posterior lateral lower leg and one on the left posterior medial lower  leg. 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 The patient has Unna boots bilaterally. 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 He has bilateral Unna boots for compression and silver alginate to his wounds. Arrives today with his legs looking as good as I have seen him in quite some time. Not surprisingly his wounds look better as well with improvement on the right lateral leg venous insufficiency wound and also the left medial leg. He is still using the compression pumps once a day 08/11/17; both legs appear to be doing better wounds on the right lateral and left medial legs look better. Skin on the right leg quite good. He is been using silver alginate as the primary dressing. I'm going to use Anasept gel calcium alginate and maintain all the secondary dressings 08/18/17; the patient continues to actually do quite well. The area on his right lateral leg is just about closed the left medial also looks better although it is still moist in this area. His edema is well controlled we have been using Anasept gel with calcium alginate and the usual secondary dressings, 4 layer compression and once daily use of his compression pumps "always been able to manage 09/01/17; the patient continues to do reasonably well in spite of his trip to [place]. The area on  the right lateral leg is epithelialized. Left is much better but still open. He has more edema and more chronic erythema on the left leg [venous inflammation] 09/08/17; he arrives today with no open wound on the right lateral leg and decently controlled edema. Unfortunately his left leg is not nearly as in his good situation as last week.he apparently had increasing edema starting on Saturday. He edema soaked through into  his foot so used a plastic bag to walk around his home. The area on the medial right leg which was his open area is about the same however he has lost surface epithelium on the left lateral which is new and he has significant pain in the Achilles area of the left foot. He is already on amoxicillin chronically for prophylaxis of cellulitis in the left leg 09/15/17; he is completed a week of doxycycline and the cellulitis in the left posterior leg and Achilles area is as usual improved. He still has a lot of edema and fluid soaking through his dressings. There is no open wound on the right leg. He saw infectious disease NP today 09/22/17;As usual 1 we transition him from our compression wraps to his stockings things did not go well. He has several small open areas on the right leg. He states this was caused by the compression wrap on his skin although he did not wear this with the stockings over them. He has several superficial areas on the left leg medially laterally posteriorly. He does not have any evidence of active cellulitis especially involving the left Achilles The patient is traveling from [facility] Saturday going to [facility]. He states he isn't attempting to get an appointment with a [facility] wound center there to change his dressings. I am not completely certain whether this will work 10/06/17; the patient came in on Friday for a nurse visit and the nurse reported that his legs actually look quite good. He arrives in clinic today for his regular follow-up visit. He has a new wound on his left third toe over the PIP probably caused by friction with his footwear. He has small areas on the left leg and a very superficial but epithelialized area on the right anterior lateral lower leg. Other than that his legs look as good as I've seen him in quite some time. We have been using silver alginate Review of systems; no chest pain no shortness of breath other than this a 10 point review of  systems negative 10/20/17; seen by Dr. Meyer Russel last week. He had taken some antibiotics [doxycycline] that he had left over. Dr. Meyer Russel thought he had candida infection and declined to give him further antibiotics. He has a small wound remaining on the right lateral leg several areas on the left leg including a larger area on the left posterior several left medial and anterior and a small wound on the left lateral. The area on the left dorsal third toe looks a lot better. ROS; Gen.; no fever, respiratory no cough no sputum Cardiac no chest pain other than this 10 point review of system is negative 10/30/17; patient arrives today having fallen in the bathtub 3 days ago. It took him a while to get up. He has pain and maceration in the wounds on his left leg which have deteriorated. He has not been using his pumps he also has some maceration on the right lateral leg. 11/03/17; patient continues to have weeping edema especially in the left leg. This saturates his dressings which were  just put on on 12/27. As usual the doxycycline seems to take care of the cellulitis on his lower leg. He is not complaining of fever, chills, or other systemic symptoms. He states his leg feels a lot better on the doxycycline I gave him empirically. He also apparently gets injections at his primary doctor's officeo Rocephin for cellulitis prophylaxis. I didn't ask him about his compression pump compliance today I think that's probably marginal. Arrives in the clinic with all of his dressings primary and secondary macerated full of fluid and he has bilateral edema 11/10/17; the patient's right leg looks some better although there is still a cluster of wounds on the right lateral. The left leg is inflamed with almost circumferential skin loss medially to laterally although we are still maintaining anteriorly. He does not have overt cellulitis there is a lot of drainage. He is not using compression pumps. We have been using  silver alginate to the wound areas, there are not a lot of options here 11/17/17; the patient's right leg continues to be stable although there is still open wounds, better than last week. The inflammation in the left leg is better. Still loss of surface layer epithelium especially posteriorly. There is no overt cellulitis in the amount of edema and his left leg is really quite good, tells me he is using his compression pumps once a day. 11/24/17; patient's right leg has a small superficial wound laterally this continues to improve. The inflammation in the left leg is still improving however we have continuous surface layer epithelial loss posteriorly. There is no overt cellulitis in the amount of edema in both legs is really quite good. He states he is using his compression pumps on the left leg once a day for 5 out of 7 days 12/01/17; very small superficial areas on the right lateral leg continue to improve. Edema control in both legs is better today. He has continued loss of surface epithelialization and left posterior calf although I think this is better. We have been using silver alginate with large number of absorptive secondary dressings 4 layer on the left Unna boot on the right at his request. He tells me he is using his compression pumps once a day 12/08/17; he has no open area on the right leg is edema control is good here. ooOn the left leg however he has marked erythema and tenderness breakdown of skin. He has what appears to be a wrap injury just distal to the popliteal fossa. 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 There was swelling erythema and breakdown in the right leg. He has much worse skin breakdown in  the left leg as well multiple open areas medially and posteriorly as well as laterally. He tells me he has been using his compression pumps but tells me he feels that the drainage out of his leg is worse when he uses a compression pumps. To be fair to him he is been saying this for a while however I don't know that I have really been listening to this. I wonder if the compression pumps are working properly 12/22/17;. Once again he arrives with severe erythema, weeping edema from the left greater than right leg. Noncompliance with compression pumps. New  this visit he is complaining of pain on the lateral aspect of the right leg and the medial aspect of his right thigh. He apparently saw his cardiologist Dr. [name] who was ordered an echocardiogram area and I think this is a step in the right direction 12/25/17; started his doxycycline Monday night. There is still intense erythema of the right leg especially in the anterior thigh although there is less tenderness. The erythema around the wound on the right lateral calf also is less tender. He still complaining of pain in the left heel. His wounds are about the same right lateral left medial left lateral. Superficial but certainly not close to closure. He denies being systemically unwell no fever chills no abdominal pain no diarrhea 12/29/17; back in follow-up of his extensive right calf and right thigh cellulitis. I added amoxicillin to cover possible doxycycline resistant strep. This seems to of done the trick he is in much less pain there is much less erythema and swelling. He has his echocardiogram at 11:00 this morning. X-ray of the left heel was also negative. 01/05/18; the patient arrived with his edema under much better control. Now that he is retired he is able to use his compression pumps daily and sometimes twice a day per the patient. He has a wound on the right leg the lateral wound looks better. Area on the left leg also looks a lot better.  He has no evidence of cellulitis in his bilateral thighs I had a quick peak at his echocardiogram. He is in normal ejection fraction and normal left ventricular function. He has moderate pulmonary hypertension moderately reduced right ventricular function. One would have to wonder about chronic sleep apnea although he says he doesn't snore. He'll review the echocardiogram with his cardiologist. 01/12/18; the patient arrives with the edema in both legs under exemplary control. He is using his compression pumps daily and sometimes twice daily. His wound on the right lateral leg is just about closed. He still has some weeping areas on the posterior left calf and lateral left calf although everything is just about closed here as well. I have spoken with Aldean Baker who is the patient's nurse practitioner and infectious disease. She was concerned that the patient had not understood that the parenteral penicillin injections he was receiving for cellulitis prophylaxis was actually benefiting him. I don't think the patient actually saw that I would tend to agree we were certainly dealing with less infections although he had a serious one last month. 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 He is healed. He'll be placed in juxtalite compression wraps and increase his lymphedema pumps to twice daily. We will follow up again next week to ensure there are no issues with the new regiment. 01/20/18-he is here for evaluation of bilateral lower extremity weeping edema. Yesterday he was placed in compression wrap to the right lower extremity and compression stocking to left lower shrubbery. He states he uses lymphedema pumps last night and again this morning and noted a blister to the left lower extremity. On exam he was noted to have drainage to the right lower extremity. He will be placed in Unna boots bilaterally and follow-up next week 01/26/18; patient was actually  discharged a week ago to his own juxta light stockings only to return the next day with bilateral lower extremity weeping edema.he was placed in bilateral Unna boots. He arrives today with pain in the back of his left leg. There is no  open area on the right leg however there is a linear/wrap injury on the left leg and weeping edema on the left leg posteriorly. I spoke with infectious disease about 10 days ago. They were disappointed that the patient elected to discontinue prophylactic intramuscular penicillin shots as they felt it was particularly beneficial in reducing the frequency of his cellulitis. I discussed this with the patient today. He does not share this view. He'll definitely need antibiotics today. Finally he is traveling to [place] and trauma leaving this Saturday and returning a week later and he does not travel with his pumps. He is going by car 01/30/18; patient was seen 4 days ago and brought back in today for review of cellulitis in the left leg posteriorly. I put him on amoxicillin this really hasn't helped as much as I might like. He is also worried because he is traveling to [facility] trauma by car. Finally we will be rewrapping him. There is no open area on the right leg over his left leg has multiple weeping areas as usual 02/09/18; The same wrap on for 10 days. He did not pick up the last doxycycline I prescribed for him. He apparently took 4 days worth he already had. There is nothing open on his right leg and the edema control is really quite good. He's had damage in the left leg medially and laterally especially probably related to the prolonged use of Unna boots 02/12/18; the patient arrived in clinic today for a nurse visit/wrap change. He complained of a lot of pain in the left posterior calf. He is taking doxycycline that I previously prescribed for him. Unfortunately even though he used his stockings and apparently used to compression pumps twice a day he has weeping  edema coming out of the lateral part of his right leg. This is coming from the lower anterior lateral skin area. 02/16/18; the patient has finished his doxycycline and will finish the amoxicillin 2 days. The area of cellulitis in the left calf posteriorly has resolved. He is no longer having any pain. He tells me he is using his compression pumps at least once a day sometimes twice. 02/23/18; the patient finished his doxycycline and Amoxil last week. On Friday he noticed a small erythematous circle about the size of a quarter on the left lower leg just above his ankle. This rapidly expanded and he now has erythema on the lateral and posterior part of the thigh. This is bright red. Also has an area on the dorsal foot just above his toes and a tender area just below the left popliteal fossa. He came off his prophylactic penicillin injections at his own insistence one or 2 months ago. This is obviously deteriorated since then 03/02/18; patient is on doxycycline and Amoxil. Culture I did last week of the weeping area on the back of his left calf grew group B strep. I have therefore renewed the amoxicillin 500 3 times a day for a further week. He has not been systemically unwell. Still complaining of an area of discomfort right under his left popliteal fossa. There is no open wound on the right leg. He tells me that he is using his pumps twice a day on most days 03/09/18; patient arrives in clinic today completing his amoxicillin today. The cellulitis on his left leg is better. Furthermore he tells me that he had intramuscular penicillin shots that his primary care office today. However he also states that the wrap on his right leg fell down shortly  after leaving clinic last week. He developed a large blister that was present when he came in for a nurse visit later in the week and then he developed intense discomfort around this area.He tells me he is using his compression pumps 03/16/18; the patient has  completed his doxycycline. The infectious part of this/cellulitis in the left heel area left popliteal area is a lot better. He has 2 open areas on the right calf. Still areas on the left calf but this is a lot better as well. 03/24/18; the patient arrives complaining of pain in the left popliteal area again. He thinks some of this is wrap injury. He has no open area on the right leg and really no open area on the left calf either except for the popliteal area. He claims to be compliant with the compression pumps 03/31/18; I gave him doxycycline last week because of cellulitis in the left popliteal area. This is a lot better although the surface epithelium is denuded off and response to this. He arrives today with uncontrolled edema in the right calf area as well as a fingernail injury in the right lateral calf. There is only a few open areas on the left 04/06/18; I gave him amoxicillin doxycycline over the last 2 weeks that the amoxicillin should be completing currently. He is not complaining of any pain or systemic symptoms. The only open areas see has is on the right lateral lower leg paradoxically I cannot see anything on the left lower leg. He tells me he is using his compression pumps twice a day on most days. Silver alginate to the wounds that are open under 4 layer compression 04/13/18; he completed antibiotics and has no new complaints. Using his compression pumps. Silver alginate that anything that's opened 04/20/18; he is using his compression pumps religiously. Silver alginate 4 layer compression anything that's opened. He comes in today with no open wounds on the left leg but 3 on the right including a new one posteriorly. He has 2 on the right lateral and one on the right posterior. He likes Unna boots on the right leg for reasons that aren't really clear we had the usual 4 layer compression on the left. It may be necessary to move to the 4 layer compression on the right however for now I  left them in the Unna boots 04/27/18; he is using his compression pumps at least once a day. He has still the wounds on the right lateral calf. The area right posteriorly has closed. He does not have an open wound on the left under 4 layer compression however on the dorsal left foot just proximal to the toes and the left third toe 2 small open areas were identified 05/11/18; he has not uses compression pumps. The areas on the right lateral calf have coalesced into one large wound necrotic surface. On the left side he has one small wound anteriorly however the edema is now weeping out of a large part of his left leg. He says he wasn't using his pumps because of the weeping fluid. I explained to him that this is the time he needs to pump more 05/18/18; patient states he is using his compression pumps twice a day. The area on the right lateral large wound albeit superficial. On the left side he has innumerable number of small new wounds on the left calf particularly laterally but several anteriorly and medially. All these appear to have healthy granulated base these look like the remnants of  blisters however they occurred under compression. The patient arrives in clinic today with his legs somewhat better. There is certainly less edema, less multiple open areas on the left calf and the right anterior leg looks somewhat better as well superficial and a little smaller. However he relates pain and erythema over the last 3-4 days in the thigh and I looked at this today. He has not been systemically unwell no fever no chills no change in blood sugar values 05/25/18; comes in today in a better state. The severe cellulitis on his left leg seems better with the Keflex. Not as tender. He has not been systemically unwell ooHard to find an open wound on the left lower leg using his compression pumps twice a day ooThe confluent wounds on his right lateral calf somewhat better looking. These will ultimately need  debridement I didn't do this today. 06/01/18; the severe cellulitis on the left anterior thigh has resolved and he is completed his Keflex. ooThere is no open wound on the left leg however there is a superficial excoriation at the base of the third toe dorsally. Skin on the bottom of his left foot is macerated looking. ooThe left the wounds on the lateral right leg actually looks some better although he did require debridement of the top half of this wound area with an open curet 06/09/18 on evaluation today patient appears to be doing poorly in regard to his right lower extremity in particular this appears to likely be infected he has very thick purulent discharge along with a bright green tent to the discharge. This makes me concerned about the possibility of pseudomonas. He's also having increased discomfort at this point on evaluation. Fortunately there does not appear to be any evidence of infection spreading to the other location at this time. 06/16/18 on evaluation today patient appears to actually be doing fairly well. His ulcer has actually diminished in size quite significantly at this point which is good news. Nonetheless he still does have some evidence of infection he did see infectious disease this morning before coming here for his appointment. I did review the results of their evaluation and their note today. They did actually have him discontinue the Cipro and initiate treatment with linezolid at this time. He is doing this for the next seven days and they recommended a follow-up in four months with them. He is the keep a log of the need for intermittent antibiotic therapy between now and when he falls back up with infectious disease. This will help them gaze what exactly they need to do to try and help them out. 06/23/18; the patient arrives today with no open wounds on the left leg and left third toe healed. He is been using his compression pumps twice a day. On the right lateral  leg he still has a sizable wound but this is a lot better than last time I saw this. 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 Has been using silver alginate under 4 layer compression 06/30/18; the only open wound he has is on the right lateral leg and this looks healthy. No debridement is required. We have been using silver alginate. He does not have an open wound on the left leg. There is apparently some drainage from the dorsal proximal third toe on the left although I see no open wound here. 07/03/18 on evaluation today patient was actually here  just for a nurse visit rapid change. However when he was here on Wednesday for his rat change due to having been healed on the left and then developing blisters we initiated the wrap again knowing that he would be back today for us to reevaluate and see were at. Unfortunately he has developed some cellulitis into the proximal portion of his right lower extremity even into the region of his thigh. 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 He was placed on one as what at that point. Nonetheless he is done with that and has been tolerating it well otherwise. Doxycycline which in the past really did not seem to be effective for him. Nonetheless I think the best option may be for us to definitely reinitiate the antibiotics for a longer period of time. 07/07/18; since I last saw this patient a week ago he has had a difficult time. At that point he did not have an open wound on his left leg. We transitioned him into juxta light stockings. He was apparently in the clinic the next day with blisters on the left lateral and left medial lower calf. He also had weeping edema fluid. He was put back into a compression wrap. He was also in the clinic on Friday with intense erythema in his right thigh. Per the patient he was  started on Bactrim however that didn't work at all in terms of relieving his pain and swelling. He has taken 3 doxycycline that he had left over from last time and that seems to of helped. He has blistering on the right thigh as well. 07/14/18; the erythema on his right thigh has gotten better with doxycycline that he is finishing. 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 Presumably this cellulitis in the thigh was not related to that although I think this is a bit concerning going forward. He still has an area on the right lateral calf the blister on the right medial calf just below the knee that was discussed above. On the left 2 small open areas left medial and left lateral. Edema control is adequate. He is using his compression pumps twice a day 07/20/18; continued improvement in the condition of both legs especially the edema in his bilateral thighs. He tells me he is been losing weight through a combination of diet and exercise. He is using his compression pumps twice a day. So overall she made to the remaining wounds 07/27/2018; continued improvement in condition of both legs. His edema is well controlled. The area on the right lateral leg is just about closed he had one blisters show up on the medial left upper calf. We have him in 4 layer compression. He is going on a 10-day trip to [place], [place] and [place]. He will be driving. He wants to wear Unna boots because of the lessening amount of constriction. He will not use compression pumps while he is away 08/05/18 on evaluation today patient actually appears to be doing decently well all things considered in regard to his bilateral lower extremities. The worst ulcer is actually only posterior aspect of his left lower extremity with a four layer compression wrap cut into his leg a couple weeks back. He did have a trip and actually had [employer] wraps for the  trip that he is worn since he was last here. Nonetheless he feels like  the [employer] wraps actually do better for him his swelling is up a little bit but he also with his trip was not taking his Lasix on a regular set schedule like he was supposed to be. He states that obviously the reason being that he cannot drive and keep going without having to urinate too frequently which makes it difficult. He did not have his pumps with him while he was away either which I think also maybe playing a role here too. 08/13/2018; the patient only has a small open wound on the right lateral calf which is a big improvement in the last month or 2. He also has the area posteriorly just below the posterior fossa on the left which I think was a wrap injury from several weeks ago. He has no current evidence of cellulitis. He tells me he is back into his compression pumps twice a day. He also tells me that while he was at the laundromat somebody stole a section of his extremitease stockings 08/20/2018; back in the clinic with a much improved state. He only has small areas on the right lateral mid calf which is just about healed. This was is more substantial area for quite a prolonged period of time. He has a small open area on the left anterior tibia. The area on the posterior calf just below the popliteal fossa is closed today. He is using his compression pumps twice a day 08/28/2018; patient has no open wound on the right leg. He has a smattering of open areas on the calf with some weeping lymphedema. More problematically than that it looks as though his wraps of slipped down in his usual he has very angry upper area of edema just below the right medial knee and on the right lateral calf. He has no open area on his feet. The patient is traveling to [facility] next week. I will send him in an antibiotic. We will continue to wrap the right leg. We ordered extremitease stockings for him last week and I plan to  transition the right leg to a stocking when he gets home which will be in 10 days time. As usual he is very reluctant to take his pumps with him when he travels 09/07/2018; patient returns from [facility]. He shows me a picture of his left leg in the mid part of his trip last week with intense fire engine erythema. The picture look bad enough I would have considered sending him to the hospital. Instead he went to the wound care center in [facility]. They did not prescribe him antibiotics but he did take some doxycycline he had leftover from a previous visit. I had given him trimethoprim sulfamethoxazole before he left this did not work according to the patient. This is resulted in some improvement fortunately. He comes back with a large wound on the left posterior calf. Smaller area on the left anterior tibia. Denuded blisters on the dorsal left foot over his toes. Does not have much in the way of wounds on the right leg although he does have a very tender area on the right posterior area just below the popliteal fossa also suggestive of infection. He promises me he is back on his pumps twice a day 09/15/2018; the intense cellulitis in his left lower calf is a lot better. The wound area on the posterior left calf is also so better. However he has reasonably extensive wounds on the dorsal aspect of his second and third toes  and the proximal foot just at the base of the toes. There is nothing open on the right leg 09/22/2018; the patient has excellent edema control in his legs bilaterally. He is using his external compression pumps twice a day. He has no open area on the right leg and only the areas in the left foot dorsally second and third toe area on the left side. He does not have any signs of active cellulitis. 10/06/2018; the patient has good edema control bilaterally. He has no open wound on the right leg. There is a blister in the posterior aspect of his left calf that we had to  deal with today. He is using his compression pumps twice a day. There is no signs of active cellulitis. We have been using silver alginate to the wound areas. He still has vulnerable areas on the base of his left first second toes dorsally He has a his extremities stockings and we are going to transition him today into the stocking on the right leg. He is cautioned that he will need to continue to use the compression pumps twice a day. If he notices uncontrolled edema in the right leg he may need to go to 3 times a day. 10/13/2018; the patient came in for a nurse check on Friday he has a large flaccid blister on the right medial calf just below the knee. We unroofed this. He has this and a new area underneath the posterior mid calf which was undoubtedly a blister as well. He also has several small areas on the right which is the area we put his extremities stocking on. 10/19/2018; the patient went to see infectious disease this morning I am not sure if that was a routine follow-up in any case the doxycycline I had given him was discontinued and started on linezolid. He has not started this. It is easy to look at his left calf and the inflammation and think this is cellulitis however he is very tender in the tissue just below the popliteal fossa and I have no doubt that there is infection going on here. He states the problem he is having is that with the compression pumps the edema goes down and then starts walking the wrap falls down. We will see if we can adhere this. He has 1 or 2 minuscule open areas on the right still areas that are weeping on the posterior left calf, the base of his left second and third toes 10/26/18; back today in clinic with quite of skin breakdown in his left anterior leg. This may have been infection the area below the popliteal fossa seems a lot better however tremendous epithelial loss on the left anterior mid tibia area over quite inexpensive tissue. He has 2 blisters  on the right side but no other open wound here. 10/29/2018; came in urgently to see [place] today and we worked him in for review. He states that the 4 layer compression on the right leg caused pain he had to cut it down to roughly his mid calf this caused swelling above the wrap and he has blisters and skin breakdown today. As a result of the pain he has not been using his pumps. Both legs are a lot more edematous and there is a lot of weeping fluid. 11/02/18; arrives in clinic with continued difficulties in the right leg> left. Leg is swollen and painful. multiple skin blisters and new open areas especially laterally. He has not been using his pumps on the right  leg. 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 He is not systemically unwell. 11/09/2018; the patient claims he is being compliant with his pumps. He is finished the doxycycline I gave him last week. Culture I did of the wound on the right lateral leg showed a few very resistant methicillin staph aureus. This was resistant to doxycycline. Nevertheless he states the pain in the leg is a lot better which makes me wonder if the cultured organism was not really what was causing the problem nevertheless this is a very dangerous organism to be culturing out of any wound. His right leg is still a lot larger than the left. He is using an Radio broadcast assistant on this area, he blames a 4-layer compression for causing the original skin breakdown which I doubt is true however I cannot talk him out of it. We have been using silver alginate to all of these areas which were initially blisters 11/16/2018; patient is being compliant with his external compression pumps at twice a day. Miraculously he arrives in clinic today with absolutely no open wounds. He has better edema control on the left where he has been using 4 layer compression versus wound of wounds on the right and I pointed this out to him. There is no inflammation  in the skin in his lower legs which is also somewhat unusual for him. There is no open wounds on the dorsal left foot. He has extremitease stockings at home and I have asked him to bring these in next week. 11/25/18 patient's lower extremity on examination today on the left appears for the most part to be wound free. He does have an open wound on the lateral aspect of the right lower extremity but this is minimal compared to what I've seen in past. He does request that we go ahead and wrap the left leg as well even though there's nothing open just so hopefully it will not reopen in short order. 1/28; patient has superficial open wounds on the right lateral calf left anterior calf and left posterior calf. His edema control is adequate. 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 We have been using silver alginate as the primary dressing. He claims compliance with his compression pumps 2/4; patient has superficial open wounds on numerous areas of his left calf and again one on the left dorsal foot. The areas on the right lateral calf have healed. The cellulitis that I gave him doxycycline for last week is also resolved this was mostly on the left anterior calf just below the tibial tuberosity. His edema looks fairly well-controlled. He tells me he went to see his primary doctor today and had blood work ordered 2/11; once again he has several open areas on the left calf left tibial area. Most of these are small and appear to have healthy granulation. He does not have anything open on the right. The edema and control in his thighs is pretty good which is usually a good indication he has been using his pumps as requested. 2/18; he continues to have several small areas on the left calf and left tibial area. Most of these are small healthy granulation. We put him in his stocking on the right leg last week and he arrives  with a superficial open area over the right upper tibia and a fairly large area on the right lateral tibia in similar  condition. His edema control actually does not look too bad, he claims to be using his compression pumps twice a day 2/25. Continued small areas on the left calf and left tibial area. New areas especially on the right are identified just below the tibial tuberosity and on the right upper tibia itself. There are also areas of weeping edema fluid even without an obvious wound. He does not have a considerable degree of lymphedema but clearly there is more edema here than his skin can handle. He states he is using the pumps twice a day. We have an Unna boot on the right and 4 layer compression on the left. 3/3; he continues to have an area on the right lateral calf and right posterior calf just below the popliteal fossa. There is a fair amount of tenderness around the wound on the popliteal fossa but I did not see any evidence of cellulitis, could just be that the wrap came down and rubbed in this area. ooHe does not have an open area on the left leg however there is an area on the left dorsal foot at the base of the third toe ooWe have been using silver alginate to all wound areas 3/10; he did not have an open area on his left leg last time he was here a week ago. Today he arrives with a horizontal wound just below the tibial tuberosity and an area on the left lateral calf. He has intense erythema and tenderness in this area. The area is on the right lateral calf and right posterior calf better than last week. We have been using silver alginate as usual 3/18 - Patient returns with 3 small open areas on left calf, and 1 small open area on right calf, the skin looks ok with no significant erythema, he continues the UNA boot on right and 4 layer compression on left. The right lateral calf wound is closed , the right posterior is small area. we will continue silver alginate to the  areas. 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 He is been tolerating the dressing changes without complication. Fortunately she has made excellent improvement in regard to the overall status of his wounds. Unfortunately every time we cease wrapping him he ends up reopening in causing more significant issues at that point. Again I'm unsure of the best direction to take although I think the lymphedema clinic may be appropriate for him. 02/03/19 on evaluation today patient appears to be doing well in regard to the wounds that we saw him for last week unfortunately he has a new area on the proximal portion of his right medial/posterior lower extremity where the wrap somewhat slowed down and caused swelling and a blister to rub and open. Unfortunately this is the only opening that he has on either leg at this point. 02/17/19 on evaluation today patient's bilateral lower extremities appear to be doing well. He still completely healed in regard to the left lower extremity. In regard to the right lower extremity the area where the wrap and slid down and caused the blister still seems to be slightly open although this is dramatically better than during the last evaluation two weeks ago. I'm very pleased with the way this stands overall. 03/03/19 on evaluation today patient appears to be doing well in regard to his  right lower extremity in general although he did have a new blister open this does not appear to be showing any evidence of active infection at this time. Fortunately there's No fevers, chills, nausea, or vomiting noted at this time. Overall I feel like he is making good progress it does feel like that the right leg will we perform the [employer] seems to do with a bit better than three layer wrap on the  left which slid down on him. We may switch to doing bilateral in the book wraps. 5/4; I have not seen Mr. Coplen in quite some time. According to our case manager he did not have an open wound on his left leg last week. He had 1 remaining wound on the right posterior medial calf. He arrives today with multiple openings on the left leg probably were blisters and/or wrap injuries from Unna boots. I do not think the Unna boot's will provide adequate compression on the left. I am also not clear about the frequency he is using the compression pumps. 03/17/19 on evaluation today patient appears to be doing excellent in regard to his lower extremities compared to last week's evaluation apparently. He had gotten significantly worse last week which is unfortunate. The [employer] wrap on the left did not seem to do very well for him at all and in fact it didn't control his swelling significantly enough he had an additional outbreak. Subsequently we go back to the four layer compression wrap on the left. This is good news. At least in that he is doing better and the wound seem to be killing him. He still has not heard anything from the lymphedema clinic. 03/24/19 on evaluation today patient actually appears to be doing much better in regard to his bilateral lower Trinity as compared to last week when I saw him. Fortunately there's no signs of active infection at this time. He has been tolerating the dressing changes without complication. Overall I'm extremely pleased with the progress and appearance in general. 04/07/19 on evaluation today patient appears to be doing well in regard to his bilateral lower extremities. His swelling is significantly down from where it was previous. With that being said he does have a couple blisters still open at this point but fortunately nothing that seems to be too severe and again the majority of the larger openings has healed at this time. 04/14/19 on evaluation today patient  actually appears to be doing quite well in regard to his bilateral lower extremities in fact I'm not even sure there's anything significantly open at this time at any site. Nonetheless he did have some trouble with these wraps where they are somewhat irritating him secondary to the fact that he has noted that the graph wasn't too close down to the end of this foot in a little bit short as well up to his knee. Otherwise things seem to be doing quite well. 04/21/19 upon evaluation today patient's wound bed actually showed evidence of being completely healed in regard to both lower extremities which is excellent news. There does not appear to be any signs of active infection which is also good news. I'm very pleased in this regard. No fevers, chills, nausea, or vomiting noted at this time. 04/28/19 on evaluation today patient appears to be doing a little bit worse in regard to both lower extremities on the left mainly due to the fact that when he went infection disease the wrap was not wrapped quite high enough he developed  a blister above this. On the right he is a small open area of nothing too significant but again this is continuing to give him some trouble he has been were in the Velcro compression that he has at home. 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 He's been tolerating the dressing changes without complication. Fortunately there's no signs of active infection at this time. No fevers, chills, nausea, or vomiting noted at this time. We have been trying to get an appointment with her lymphedema clinic in [facility] but unfortunately nobody can get them on phone with not been able to even fax information over the patient likewise is not been able to get in touch with them. Overall I'm not sure exactly what's going on here with to reach out again today. 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 Still having a lot of drainage unfortunately. He tells me especially in the left but even on the right. There's no signs of active infection which is good news we've been using so ratcheted up to this point. 05/19/19 on evaluation today patient actually appears to be doing quite well with regard to his left lower extremity which is great news. Fortunately in regard to the right lower extremity has an issues with his wrap and he subsequently did remove this from what I'm understanding. Nonetheless long story short is what he had rewrapped once he removed it subsequently had maggots underneath this wrap whenever he came in for evaluation today. With that being said they were obviously completely cleaned away by the nursing staff. The visit today which is excellent news. However he does appear to potentially have some infection around the right ankle region where the maggots were located as well. He will likely require anabiotic therapy today. 05/26/19 on evaluation today patient actually appears to be doing much better in regard to his bilateral lower extremities. I feel like the infection is under much better control. With that being said there were maggots noted when the wrap was removed yet again today. Again this could have potentially been left over from previous although at this time there does not appear to be any signs of significant drainage there was obviously on the wrap some drainage as well this contracted gnats or otherwise. Either way I do not see anything that appears to be doing worse in my pinion and in fact I think his drainage has slowed down quite significantly likely mainly due to the fact to his infection being under better control. 06/02/2019 on evaluation today patient actually appears to be doing well with regard to his bilateral lower extremities there is no signs of active infection at this time which is  great news. With that being said he does have several open areas more so on the right than the left but nonetheless these are all significantly better than previously noted. 06/09/2019 on evaluation today patient actually appears to be doing well. His wrap stayed up and he did not cause any problems he had more drainage on the right compared to the left but overall I do not see any major issues at this time which is great news. 06/16/2019 on evaluation today patient appears to be doing excellent with regard to his lower extremities the only area that is open is a new blister that can have opened as of today on the medial  ankle on the left. Other than this he really seems to be doing great I see no major issues at this point. 06/23/2019 on evaluation today patient appears to be doing quite well with regard to his bilateral lower extremities. In fact he actually appears to be almost completely healed there is a small area of weeping noted of the right lower extremity just above the ankle. Nonetheless fortunately there is no signs of active infection at this time which is good news. No fevers, chills, nausea, vomiting, or diarrhea. 8/24; the patient arrived for a nurse visit today but complained of very significant pain in the left leg and therefore I was asked to look at this. Noted that he did not have an open area on the left leg last week nevertheless this was wrapped. The patient states that he is not been able to put his compression pumps on the left leg because of the discomfort. He has not been systemically unwell 06/30/2019 on evaluation today patient unfortunately despite being excellent last week is doing much worse with regard to his left lower extremity today. In fact he had to come in for a nurse on Monday where his left leg had to be rewrapped due to excessive weeping Dr. Leanord Hawking placed him on doxycycline at that point. Fortunately there is no signs of active infection Systemically at this  time which is good news. 07/07/2019 in regard to the patient's wounds today he actually seems to be doing well with his right lower extremity there really is nothing open or draining at this point this is great news. Unfortunately the left lower extremity is given him additional trouble at this time. There does not appear to be any signs of active infection nonetheless he does have a lot of edema and swelling noted at this point as well as blistering all of which has led to a much more poor appearing leg at this time compared to where it was 2 weeks ago when it was almost completely healed. Obviously this is a little discouraging for the patient. He is try to contact the lymphedema clinic in [place] he has not been able to get through to them. 07/14/2019 on evaluation today patient actually appears to be doing slightly better with regard to his left lower extremity ulcers. Overall I do feel like at least at the top of the wrap that we have been placing this area has healed quite nicely and looks much better. The remainder of the leg is showing signs of improvement. Unfortunately in the thigh area he still has an open region on the left and again on the right he has been utilizing just a Band-Aid on an area that also opened on the thigh. Again this is an area that were not able to wrap although we did do an Ace wrap to provide some compression that something that obviously is a little less effective than the compression wraps we have been using on the lower portion of the leg. He does have an appointment with the lymphedema clinic in [facility] on Friday. 07/21/2019 on evaluation today patient appears to be doing better with regard to his lower extremity ulcers. He has been tolerating the dressing changes without complication. Fortunately there is no signs of active infection at this time. No fevers, chills, nausea, vomiting, or diarrhea. I did receive the paperwork from the physical therapist at  the lymphedema clinic in [place]. Subsequently I signed off on that this morning and sent that back to him for further  progression with the treatment plan. 07/28/2019 on evaluation today patient appears to be doing very well with regard to his right lower extremity where I do not see any open wounds at this point. Fortunately he is feeling great as far as that is concerned as well. In regard to the left lower extremity he has been having issues with still several areas of weeping and edema although the upper leg is doing better his lower leg still I think is going require the compression wrap at this time. No fevers, chills, nausea, vomiting, or diarrhea. 08/04/2019 on evaluation today patient unfortunately is having new wounds on the right lower extremity. Again we have been using Unna boot wrap on that side. We switched him to using his juxta lite wrap at home. With that being said he tells me he has been using it although his legs extremely swollen and to be honest really does not appear that he has been. I cannot know that for sure however. Nonetheless he has multiple new wounds on the right lower extremity at this time. Obviously we will have to see about getting this rewrapped for him today. 08/11/2019 on evaluation today patient appears to be doing fairly well with regard to his wounds. He has been tolerating the dressing changes including the compression wraps without complication. He still has a lot of edema in his upper thigh regions bilaterally he is supposed to be seeing the lymphedema clinic on the 15th of this month once his wraps arrive for the upper part of his legs. 08/18/2019 on evaluation today patient appears to be doing well with regard to his bilateral lower extremities at this point. He has been tolerating the dressing changes without complication. Fortunately there is no signs of active infection which is also good news. He does have a couple weeping areas on the first  and second toe of the right foot he also has just a small area on the left foot upper leg and a small area on the left lower leg but overall he is doing quite well in my opinion. He is supposed to be getting his wraps shortly in fact tomorrow and then subsequently is seeing the lymphedema clinic next Wednesday on the 21st. Of note he is also leaving on the 25th to go on vacation for a week to the beach. For that reason and since there is some uncertainty about what there can be doing at lymphedema clinic next Wednesday I am get a make an appointment for next Friday here for [place] to see what we need to do for him prior to him leaving for vacation. 10/23; patient arrives in considerable pain predominantly in the upper posterior calf just distal to the popliteal fossa also in the wound anteriorly above the major wound. This is probably cellulitis and he has had this recurrently in the past. He has no open wound on the right side and he has had an Radio broadcast assistant in that area. Finally I note that he has an area on the left posterior calf which by enlarge is mostly epithelialized. This protrudes beyond the borders of the surrounding skin in the setting of dry scaly skin and lymphedema. The patient is leaving for [facility] on Sunday. Per his longstanding pattern, he will not take his compression pumps with him predominantly out of fear that they will be stolen. He therefore asked that we put a Unna boot back on the right leg. He will also contact the wound care center in [facility]  to see if they can change his dressing in the mid week. 11/3; patient returned from his vacation to [facility]. He was seen on 1 occasion at their wound care center. They did a 2 layer compression system as they did not have our 4-layer wrap. I am not completely certain what they put on the wounds. They did not change the Unna boot on the right. The patient is also seeing a lymphedema specialist physical therapist in  [place]. It appears that he has some compression sleeve for his thighs which indeed look quite a bit better than I am used to seeing. He pumps over these with his external compression pumps. 11/10; the patient has a new wound on the right medial thigh otherwise there is no open areas on the right. He has an area on the left leg posteriorly anteriorly and medially and an area over the left second toe. We have been using silver alginate. He thinks the injury on his thigh is secondary to friction from the compression sleeve he has. 11/17; the patient has a new wound on the right medial thigh last week. He thinks this is because he did not have a underlying stocking for his thigh juxta lite apparatus. He now has this. The area is fairly large and somewhat angry but I do not think he has underlying cellulitis. ooHe has a intact blister on the right anterior tibial area. ooSmall wound on the right great toe dorsally ooSmall area on the medial left calf. 11/30; the patient does not have any open areas on his right leg and we did not take his juxta lite stocking off. However he states that on Friday his compression wrap fell down lodging around his upper mid calf area. As usual this creates a lot of problems for him. He called urgently today to be seen for a nurse visit however the nurse visit turned into a provider visit because of extreme erythema and pain in the left anterior tibia extending laterally and posteriorly. The area that is problematic is extensive 10/06/2019 upon evaluation today patient actually appears to be doing poorly in regard to his left lower extremity. He Dr. Leanord Hawking did place him on doxycycline this past Monday apparently due to the fact that he was doing much worse in regard to this left leg. Fortunately the doxycycline does seem to be helping. Unfortunately we are still having a very difficult time getting his edema under any type of control in order to anticipate  discharge at some point. The only way were really able to control his lymphedema really is with compression wraps and that has only even seemingly temporary. He has been seeing a lymphedema clinic they are trying to help in this regard but still this has been somewhat frustrating in general for the patient. 10/13/19 on evaluation today patient appears to be doing excellent with regard to his right lower extremity as far as the wounds are concerned. His swelling is still quite extensive unfortunately. He is still having a lot of drainage from the thigh areas bilaterally which is unfortunate. He's been going to lymphedema clinic but again he still really does not have this edema under control as far as his lower extremities are concern. With regard to his left lower extremity this seems to be improving and I do believe the doxycycline has been of benefit for him. He is about to complete the doxycycline. 10/20/2019 on evaluation today patient appears to be doing poorly in regard to his bilateral lower extremities.  More in the right thigh he has a lot of irritation at this site unfortunately. In regard to the left lower extremity the wrap was not quite as high it appears and does seem to have caused him some trouble as well. Fortunately there is no evidence of systemic infection though he does have some blue-green drainage which has me concerned for the possibility of Pseudomonas. He tells me he is previously taking Cipro without complications and he really does not care for Levaquin however due to some of the side effects he has. He is not allergic to any medications specifically antibiotics that were aware of. 10/27/2019 on evaluation today patient actually does appear to be for the most part doing better when compared to last week's evaluation. With that being said he still has multiple open wounds over the bilateral lower extremities. He actually forgot to start taking the Cipro and states that he  still has the whole bottle. He does have several new blisters on left lower extremity today I think I would recommend he go ahead and take the Cipro based on what I am seeing at this point. 12/30-Patient comes at 1 week visit, 4 layer compression wraps on the left and Unna boot on the right, primary dressing Xtrasorb and silver alginate. Patient is taking his Cipro and has a few more days left probably 5-6, and the legs are doing better. He states he is using his compressions devices which I believe he has 11/10/2019 on evaluation today patient actually appears to be much better than last time I saw him 2 weeks ago. His wounds are significantly improved and overall I am very pleased in this regard. Fortunately there is no signs of active infection at this time. He is just a couple of days away from completing Cipro. Overall his edema is much better he has been using his lymphedema pumps which I think is also helping at this point. 11/17/2019 on evaluation today patient appears to be doing excellent in regard to his wounds in general. His legs are swollen but not nearly as much as they have been in the past. Fortunately he is tolerating the compression wraps without complication. No fevers, chills, nausea, vomiting, or diarrhea. He does have some erythema however in the distal portion of his right lower extremity specifically around the forefoot and toes there is a little bit of warmth here as well. Objective Constitutional Well-nourished and well-hydrated in no acute distress. Vitals Time Taken: 10:00 AM, Height: 70 in, Weight: 380.2 lbs, BMI: 54.5, Temperature: 97.6 F, Pulse: 57 bpm, Respiratory Rate: 20 breaths/min, Blood Pressure: 184/74 mmHg, Capillary Blood Glucose: 141 mg/dl. General Notes: glucose per pt report Respiratory normal breathing without difficulty. Psychiatric this patient is able to make decisions and demonstrates good insight into disease process. Alert and Oriented x  3. pleasant and cooperative. General Notes: Though the patient seems to be doing quite well with regard to his wounds in general I am concerned about the possibility of infection at this time. For that reason I discussed with the patient that we may want to consider going ahead and placing him back on the Cipro for some time. He is actually in agreement with that plan. Especially in light of the fact that he is concerned about the infection worsening obviously. No sharp debridement was necessary nor performed today. Integumentary (Hair, Skin) Wound #145R status is Open. Original cause of wound was Gradually Appeared. The wound is located on the Right,Medial Upper Leg. The wound measures 0.8cm  length x 1cm width x 0.1cm depth; 0.628cm^2 area and 0.063cm^3 volume. There is no tunneling or undermining noted. There is a medium amount of serosanguineous drainage noted. The wound margin is flat and intact. There is large (67-100%) red granulation within the wound bed. There is no necrotic tissue within the wound bed. Wound #151 status is Healed - Epithelialized. Original cause of wound was Blister. The wound is located on the Left,Lateral Lower Leg. The wound measures 0cm length x 0cm width x 0cm depth; 0cm^2 area and 0cm^3 volume. There is no tunneling or undermining noted. There is a none present amount of drainage noted. The wound margin is distinct with the outline attached to the wound base. There is no granulation within the wound bed. There is no necrotic tissue within the wound bed. Wound #154 status is Open. Original cause of wound was Shear/Friction. The wound is located on the Left,Medial Lower Leg. The wound measures 1.5cm length x 0.7cm width x 0.1cm depth; 0.825cm^2 area and 0.082cm^3 volume. There is no tunneling or undermining noted. There is a medium amount of serous drainage noted. The wound margin is distinct with the outline attached to the wound base. There is large (67-100%)  pink, pale granulation within the wound bed. There is no necrotic tissue within the wound bed. Wound #155 status is Open. Original cause of wound was Blister. The wound is located on the Right,Distal,Medial Lower Leg. The wound measures 1.8cm length x 1.2cm width x 0.1cm depth; 1.696cm^2 area and 0.17cm^3 volume. There is Fat Layer (Subcutaneous Tissue) Exposed exposed. There is no tunneling or undermining noted. There is a medium amount of serosanguineous drainage noted. The wound margin is distinct with the outline attached to the wound base. There is large (67-100%) red granulation within the wound bed. There is no necrotic tissue within the wound bed. 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 There was a Four Layer Compression Therapy Procedure by Yevonne Pax, RN. Post procedure Diagnosis Wound #154: Same as Pre-Procedure Wound #155 Pre-procedure diagnosis of Wound #155 is a Lymphedema located on the Right,Distal,Medial Lower Leg . There was a Radio broadcast assistant Compression Therapy Procedure by Yevonne Pax, RN. Post procedure Diagnosis Wound #155: Same as Pre-Procedure Plan Follow-up Appointments: Return Appointment in 1 week. Dressing Change Frequency: Do not change entire dressing for one week. - both lower legs Skin Barriers/Peri-Wound Care: Moisturizing lotion TCA Cream or Ointment - to legs mixed with lotion Wound Cleansing: May shower with protection. Primary Wound  Dressing: Wound #145R Right,Medial Upper Leg: Calcium Alginate with Silver Wound #154 Left,Medial Lower Leg: Calcium Alginate with Silver Wound #155 Right,Distal,Medial Lower Leg: Calcium Alginate with Silver Secondary Dressing: Wound #145R Right,Medial Upper Leg: ABD pad - secured with tape Wound #154 Left,Medial Lower Leg: ABD pad Wound #155 Right,Distal,Medial Lower Leg: ABD pad Edema Control: 4 layer compression: Left lower extremity - unna boot at upper portion of lower leg. Unna Boot to Right Lower Extremity Avoid standing for long periods of time Elevate legs to the level of the heart or above for 30 minutes daily  and/or when sitting, a frequency of: - throughout the day Exercise regularly Segmental Compressive Device. - lymphadema pumps 60 min 2 times per day Other: - lymphadema wraps to upper leg per [facility] clinic The following medication(s) was prescribed: Cipro oral 500 mg tablet 1 1 tablet oral taken 2 times a day for 14 days starting 11/17/2019 1. I would recommend currently that we go ahead and place the patient back on Cipro he is done well with this in the past I think it is a good option for him. 2. I would recommend as well that we continue with the current wound care measures which includes the silver alginate dressing to any open wound locations. Will use in a 4-layer wrap on the left lower extremity and an Unna boot wrap on the right lower extremity. We will see patient back for reevaluation in 1 week here in the clinic. If anything worsens or changes patient will contact our office for additional recommendations. Electronic Signature(s) Signed: 11/17/2019 10:59:33 AM By: Lenda Kelp PA-C Previous Signature: 11/17/2019 10:57:33 AM Version By: Lenda Kelp PA-C Entered By: Lenda Kelp on 11/17/2019 10:59:33 -------------------------------------------------------------------------------- SuperBill Details Patient Name: Date of Service: [name] 11/17/2019 Medical Record [id number] Patient Account Number: [id number] Date of Birth/Sex: Treating RN: [id number] (69 y.o. [name] Primary Care Provider: Nicoletta Ba Other Clinician: Referring Provider: Treating Provider/Extender:[name], [name], [name] in Treatment: 199 Diagnosis Coding ICD-10 Codes Code Description 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 skin L97.221 Non-pressure chronic ulcer of left calf limited to breakdown of skin 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 of bilateral lower extremity I89.0 Lymphedema, not elsewhere classified E11.622 Type 2 diabetes mellitus with other skin ulcer 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, unspecified L03.116 Cellulitis of left lower limb Facility Procedures CPT4 Code Description: [id number] (Facility Use Only) [phone] - APPLY UNNA BOOT RT Modifier: Quantity: 1 CPT4 Code Description: [id number] (Facility Use Only) [id number] - APPLY MULTLAY COMPRS LWR LT LEG Modifier: 59 Quantity: 1 Physician Procedures CPT4: Description Modifier Quantity Code [id number] - WC PHYS LEVEL 4 - EST PT 1 ICD-10 Diagnosis Description 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 skin L97.221 Non-pressure chronic ulcer of left calf limited to  breakdown of skin 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 of bilateral lower extremity I89.0 Lymphedema, not elsewhere classified Electronic Signature(s) Signed: 11/17/2019 10:59:50 AM By: Lenda Kelp PA-C Entered By: Lenda Kelp on 11/17/2019 10:59:48

## 2019-11-17 NOTE — Telephone Encounter (Signed)
Spoke with patient and advised we would find out the cost with our billing person, Dawn. He will be notified once discussed.
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
## 2019-11-24 ENCOUNTER — Encounter (HOSPITAL_BASED_OUTPATIENT_CLINIC_OR_DEPARTMENT_OTHER): Payer: Medicare Other | Admitting: Physician Assistant

## 2019-11-24 ENCOUNTER — Other Ambulatory Visit: Payer: Self-pay

## 2019-11-24 DIAGNOSIS — L03116 Cellulitis of left lower limb: Secondary | ICD-10-CM | POA: Diagnosis not present

## 2019-11-24 DIAGNOSIS — I10 Essential (primary) hypertension: Secondary | ICD-10-CM | POA: Diagnosis not present

## 2019-11-24 DIAGNOSIS — E11622 Type 2 diabetes mellitus with other skin ulcer: Secondary | ICD-10-CM | POA: Diagnosis not present

## 2019-11-24 DIAGNOSIS — Z881 Allergy status to other antibiotic agents status: Secondary | ICD-10-CM | POA: Diagnosis not present

## 2019-11-24 DIAGNOSIS — I482 Chronic atrial fibrillation, unspecified: Secondary | ICD-10-CM | POA: Diagnosis not present

## 2019-11-24 DIAGNOSIS — L97221 Non-pressure chronic ulcer of left calf limited to breakdown of skin: Secondary | ICD-10-CM | POA: Diagnosis not present

## 2019-11-24 DIAGNOSIS — L97219 Non-pressure chronic ulcer of right calf with unspecified severity: Secondary | ICD-10-CM | POA: Diagnosis not present

## 2019-11-24 DIAGNOSIS — E1140 Type 2 diabetes mellitus with diabetic neuropathy, unspecified: Secondary | ICD-10-CM | POA: Diagnosis not present

## 2019-11-24 DIAGNOSIS — L97119 Non-pressure chronic ulcer of right thigh with unspecified severity: Secondary | ICD-10-CM | POA: Diagnosis not present

## 2019-11-24 DIAGNOSIS — I890 Lymphedema, not elsewhere classified: Secondary | ICD-10-CM | POA: Diagnosis not present

## 2019-11-24 DIAGNOSIS — L97529 Non-pressure chronic ulcer of other part of left foot with unspecified severity: Secondary | ICD-10-CM | POA: Diagnosis not present

## 2019-11-24 DIAGNOSIS — I87333 Chronic venous hypertension (idiopathic) with ulcer and inflammation of bilateral lower extremity: Secondary | ICD-10-CM | POA: Diagnosis not present

## 2019-11-24 DIAGNOSIS — Z9119 Patient's noncompliance with other medical treatment and regimen: Secondary | ICD-10-CM | POA: Diagnosis not present

## 2019-11-24 DIAGNOSIS — I872 Venous insufficiency (chronic) (peripheral): Secondary | ICD-10-CM | POA: Diagnosis not present

## 2019-11-24 DIAGNOSIS — L97229 Non-pressure chronic ulcer of left calf with unspecified severity: Secondary | ICD-10-CM | POA: Diagnosis not present

## 2019-11-24 NOTE — Progress Notes (Addendum)
REMBERT, BROWE ([id number]) Visit Report for 11/24/2019 Chief Complaint Document Details Patient Name: Date of Service: [name], [name] 11/24/2019 10:15 AM Medical Record [id number] Patient Account Number: [id number] Date of Birth/Sex: Treating RN: 1951/08/16 (69 y.o. M) Primary Care Provider: Nicoletta Ba Other Clinician: Referring Provider: Treating Provider/Extender:[name], [name], [name] in Treatment: 200 Information Obtained from: Patient Chief Complaint patient is here for evaluation venous/lymphedema weeping Electronic Signature(s) Signed: 11/24/2019 11:25:56 AM By: Lenda Kelp PA-C Entered By: Lenda Kelp on 11/24/2019 11:25:55 -------------------------------------------------------------------------------- HPI Details Patient Name: Date of Service: [name], [name] 11/24/2019 10:15 AM Medical Record [id number] Patient Account Number: [id number] Date of Birth/Sex: Treating RN: 1951/09/09 (69 y.o. M) Primary Care Provider: Nicoletta Ba Other Clinician: Referring Provider: Treating Provider/Extender:[name], [name], [name] in Treatment: 200 History of Present Illness HPI Description: Referred by PCP for consultation. Patient has long standing history of BLE venous stasis, no prior ulcerations. At beginning of month, developed cellulitis and weeping. Received IM Rocephin followed by Keflex and resolved. Wears compression stocking, appr 6 months old. Not sure strength. No present drainage. 01/22/16 this is a patient who is a type II diabetic on insulin. 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 He tells me he religiously wears pressure stockings of uncertain strength. He was here with weeping edema about 8 months ago but did not have an open wound. Roughly a month ago he had a reopening on his bilateral legs. He is been using bandages and Neosporin. He does not complain of pain. 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 Last BUN/creatinine I have is from 11/20/15 at 13 and 1.0 respectively 01/29/16; patient arrives today having tolerated the Profore wrap. He brought in his stockings and these are 18 mmHg stockings he bought from [place]. The compression here is likely inadequate. He does not complain of pain or excessive drainage she has no systemic symptoms. The wound on the right looks improved as does the one on the left although one on the left is more substantial with still tissue at risk below the actual wound area on the bilateral posterior calf 02/05/16; patient arrives with poor edema control. He states that we did put a 4 layer compression on it last week. No weight appear 5 this. 02/12/16; the area on the posterior right Has healed. The left Has a substantial wound that has necrotic surface eschar that requires a debridement with a curette. 02/16/16;the patient called or a Nurse visit secondary to increased swelling. He had been in earlier in the week with his right leg healed. He was transitioned to is on pressure stocking on the right leg with the only open wound on the left, a substantial area on the left posterior calf. Note he has a history of severe lower extremity edema, he has a history of chronic atrial fibrillation but not heart failure per my notes but I'll need to research this. He is not complaining of chest pain shortness of breath or orthopnea. The intake nurse noted blisters on the previously closed right leg 02/19/16; this is the patient's regular visit day. I see him on Friday with escalating edema new wounds on the right leg and clear signs of at least right ventricular heart failure. I increased his Lasix to 40 twice a day. He is returning currently in follow-up. States he is noticed a decrease  in that the edema 02/26/16 patient's legs have much  less edema. There is nothing really open on the right leg. The left leg has improved condition of the large superficial wound on the posterior left leg 03/04/16; edema control is very much better. The patient's right leg wounds have healed. On the left leg he continues to have severe venous inflammation on the posterior aspect of the left leg. There is no tenderness and I don't think any of this is cellulitis. 03/11/16; patient's right leg is married healed and he is in his own stocking. The patient's left leg has deteriorated somewhat. There is a lot of erythema around the wound on the posterior left leg. There is also a significant rim of erythema posteriorly just above where the wrap would've ended there is a new wound in this location and a lot of tenderness. Can't rule out cellulitis in this area. 03/15/16; patient's right leg remains healed and he is in his own stocking. The patient's left leg is much better than last review. His major wound on the posterior aspect of his left Is almost fully epithelialized. He has 3 small injuries from the wraps. Really. Erythema seems a lot better on antibiotics 03/18/16; right leg remains healed and he is in his own stocking. The patient's left leg is much better. The area on the posterior aspect of the left calf is fully epithelialized. His 3 small injuries which were wrap injuries on the left are improved only one seems still open his erythema has resolved 03/25/16; patient's right leg remains healed and he is in his own stocking. There is no open area today on the left leg posterior leg is completely closed up. His wrap injuries at the superior aspect of his leg are also resolved. He looks as though he has some irritation on the dorsal ankle but this is fully epithelialized without evidence of infection. 03/28/16; we discharged this patient on Monday. Transitioned him into his own stocking. There were problems almost immediately with uncontrolled swelling  weeping edema multiple some of which have opened. He does not feel systemically unwell in particular no chest pain no shortness of breath and he does not feel 04/08/16; the edema is under better control with the Profore light wrap but he still has pitting edema. There is one large wound anteriorly 2 on the medial aspect of his left leg and 3 small areas on the superior posterior calf. Drainage is not excessive he is tolerating a Profore light well 04/15/16; put a Profore wrap on him last week. This is controlled is edema however he had a lot of pain on his left anterior foot most of his wounds are healed 04/22/16 once again the patient has denuded areas on the left anterior foot which he states are because his wrap slips up word. He saw his primary physician today is on Lasix 40 twice a day and states that he his weight is down 20 pounds over the last 3 months. 04/29/16: Much improved. left anterior foot much improved. He is now on Lasix 80 mg per day. Much improved edema control 05/06/16; I was hoping to be able to discharge him today however once again he has blisters at a low level of where the compression was placed last week mostly on his left lateral but also his left medial leg and a small area on the anterior part of the left foot. 05/09/16; apparently the patient went home after his appointment on 7/4 later in the evening developing pain  in his upper medial thigh together with subjective fever and chills although his temperature was not taken. The pain was so intense he felt he would probably have to call 911. However he then remembered that he had leftover doxycycline from a previous round of antibiotics and took these. By the next morning he felt a lot better. He called and spoke to one of our nurses and I approved doxycycline over the phone thinking that this was in relation to the wounds we had previously seen although they were definitely were not. The patient feels a lot better old fever no  chills he is still working. Blood sugars are reasonably controlled 05/13/16; patient is back in for review of his cellulitis on his anterior medial upper thigh. He is taking doxycycline this is a lot better. Culture I did of the nodular area on the dorsal aspect of his foot grew MRSA this also looks a lot better. 05/20/16; the patient is cellulitis on the medial upper thigh has resolved. All of his wound areas including the left anterior foot, areas on the medial aspect of the left calf and the lateral aspect of the calf at all resolved. He has a new blister on the left dorsal foot at the level of the fourth toe this was excised. No evidence of infection 05/27/16; patient continues to complain weeping edema. He has new blisterlike wounds on the left anterior lateral and posterior lateral calf at the top of his wrap levels. The area on his left anterior foot appears better. He is not complaining of fever, pain or pruritus in his feet. 05/30/16; the patient's blisters on his left anterior leg posterior calf all look improved. He did not increase the Lasix 100 mg as I suggested because he was going to run out of his 40 mg tablets. He is still having weeping edema of his toes 06/03/16; I renewed his Lasix at 80 mg once a day as he was about to run out when I last saw him. He is on 80 mg of Lasix now. I have asked him to cut down on the excessive amount of water he was drinking and asked him to drink according to his thirst mechanisms 06/12/2016 -- was seen 2 days ago and was supposed to wear his compression stockings at home but he is developed lymphedema and superficial blisters on the left lower extremity and hence came in for a review 06/24/16; the remaining wound is on his left anterior leg. He still has edema coming from between his toes. There is lymphedema here however his edema is generally better than when I last saw this. He has a history of atrial fibrillation but does not have a known history  of congestive heart failure nevertheless I think he probably has this at least on a diastolic basis. 07/01/16 I reviewed his echocardiogram from January 2017. This was essentially normal. He did not have LVH, EF of 55-60%. His right ventricular function was normal although he did have trivial tricuspid and pulmonic regurgitation. This is not audible on exam however. I increased his Lasix to do massive edema in his legs well above his knees I think in early July. He was also drinking an excessive amount of water at the time. 07/15/16; missed his appointment last week because of the Labor Day holiday on Monday. He could not get another appointment later in the week. Started to feel the wrap digging in superiorly so we remove the top half and the bottom half of his wrap.  He has extensive erythema and blistering superiorly in the left leg. Very tender. Very swollen. Edema in his foot with leaking edema fluid. He has not been systemically unwell 07/22/16; the area on the left leg laterally required some debridement. The medial wounds look more stable. His wrap injury wounds appear to have healed. Edema and his foot is better, weeping edema is also better. He tells me he is meeting with the supplier of the external compression pumps at work 08/05/16; the patient was on vacation last week in [facility]. His wrap is been on for an extended period of time. Also over the weekend he developed an extensive area of tender erythema across his anterior medial thigh. He took to doxycycline yesterday that he had leftover from a previous prescription. The patient complains of weeping edema coming out of his toes 08/08/16; I saw this patient on 10/2. He was tender across his anterior thigh. I put him on doxycycline. He returns today in follow-up. He does not have any open wounds on his lower leg, he still has edema weeping into his toes. 08/12/16; patient was seen back urgently today to follow-up for his extensive left  thigh cellulitis/erysipelas. He comes back with a lot less swelling and erythema pain is much better. I believe I gave him Augmentin and Cipro. His wrap was cut down as he stated a roll down his legs. He developed blistering above the level of the wrap that remained. He has 2 open blisters and 1 intact. 08/19/16; patient is been doing his primary doctor who is increased his Lasix from 40-80 once a day or 80 already has less edema. Cellulitis has remained improved in the left thigh. 2 open areas on the posterior left calf 08/26/16; he returns today having new open blisters on the anterior part of his left leg. He has his compression pumps but is not yet been shown how to use some vital representative from the supplier. 09/02/16 patient returns today with no open wounds on the left leg. Some maceration in his plantar toes 09/10/2016 -- Dr. Leanord Hawking had recently discharged him on 09/02/2016 and he has come right back with redness swelling and some open ulcers on his left lower extremity. He says this was caused by trying to apply his compression stockings and he's been unable to use this and has not been able to use his lymphedema pumps. He had some doxycycline leftover and he has started on this a few days ago. 09/16/16; there are no open wounds on his leg on the left and no evidence of cellulitis. 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 He does not complain of symptoms here. I am not clear use using his external compression pumps. 09/23/16; I have not seen this patient in 2 weeks. He canceled his appointment 10 days ago as he was going on vacation. He tells me that on Monday he noticed a large area on his posterior left leg which is been draining copiously and is reopened into a large wound. He is been using ABDs and the external part of his juxtalite, according to our nurse this was not on properly. 10/07/16; Still a substantial area on the  posterior left leg. Using silver alginate 10/14/16; in general better although there is still open area which looks healthy. Still using silver alginate. He reminds me that this happen before he left for [facility]. Today while he was showering in the morning. He had been using  his juxtalite's 10/21/16; the area on his posterior left leg is fully epithelialized. However he arrives today with a large area of tender erythema in his medial and posterior left thigh just above the knee. I have marked the area. Once again he is reluctant to consider hospitalization. I treated him with oral antibiotics in the past for a similar situation with resolution I think with doxycycline however this area it seems more extensive to me. He is not complaining of fever but does have chills and says states he is thirsty. His blood sugar today was in the 140s at home 10/25/16 the area on his posterior left leg is fully epithelialized although there is still some weeping edema. The large area of tenderness and erythema in his medial and posterior left thigh is a lot less tender although there is still a lot of swelling in this thigh. He states he feels a lot better. He is on doxycycline and Augmentin that I started last week. This will continued until Tuesday, December 26. I have ordered a duplex ultrasound of the left thigh rule out DVT whether there is an abscess something that would need to be drained I would also like to know. 11/01/16; he still has weeping edema from a not fully epithelialized area on his left posterior calf. Most of the rest of this looks a lot better. He has completed his antibiotics. His thigh is a lot better. Duplex ultrasound did not show a DVT in the thigh 11/08/16; he comes in today with more Denuded surface epithelium from the posterior aspect of his calf. There is no real evidence of cellulitis. The superior aspect of his wrap appears to have put quite an indentation in his leg just below  the knee and this may have contributed. He does not complain of pain or fever. We have been using silver alginate as the primary dressing. The area of cellulitis in the right thigh has totally resolved. He has been using his compression stockings once a week 11/15/16; the patient arrives today with more loss of epithelium from the posterior aspect of his left calf. He now has a fairly substantial wound in this area. The reason behind this deterioration isn't exactly clear although his edema is not well controlled. He states he feels he is generally more swollen systemically. He is not complaining of chest pain shortness of breath fever. Tells me he has an appointment with his primary physician in early February. He is on 80 mg of oral Lasix a day. He claims compliance with the external compression pumps. He is not having any pain in his legs similar to what he has with his recurrent cellulitis 11/22/16; the patient arrives a follow-up of his large area on his left lateral calf. This looks somewhat better today. He came in earlier in the week for a dressing change since I saw him a week ago. He is not complaining of any pain no shortness of breath no chest pain 11/28/16; the patient arrives for follow-up of his large area on the left lateral calf this does not look better. In fact it is larger weeping edema. The surface of the wound does not look too bad. We have been using silver alginate although I'm not certain that this is a dressing issue. 12/05/16; again the patient follows up for a large wound on the left lateral and left posterior calf this does not look better. There continues to be weeping edema necrotic surface tissue. More worrisome than this once again there  is erythema below the wound involving the distal Achilles and heel suggestive of cellulitis. He is on his feet working most of the day of this is not going well. We are changing his dressing twice a week to facilitate the  drainage. 12/12/16; not much change in the overall dimensions of the large area on the left posterior calf. This is very inflamed looking. I gave him an. Doxycycline last week does not really seem to have helped. He found the wrap very painful indeed it seems to of dog into his legs superiorly and perhaps around the heel. He came in early today because the drainage had soaked through his dressings. 12/19/16- patient arrives for follow-up evaluation of his left lower extremity ulcers. He states that he is using his lymphedema pumps once daily when there is "no drainage". He admits to not using his lipedema pumps while under current treatment. His blood sugars have been consistently between 150-200. 12/26/16; the patient is not using his compression pumps at home because of the wetness on his feet. I've advised him that I think it's important for him to use this daily. He finds his feet too wet, he can put a plastic bag over his legs while he is in the pumps. Otherwise I think will be in a vicious circle. We are using silver alginate to the major area on his left posterior calf 01/02/17; the patient's posterior left leg has further of all into 3 open wounds. All of them covered with a necrotic surface. He claims to be using his compression pumps once a day. His edema control is marginal. Continue with silver alginate 01/10/17; the patient's left posterior leg actually looks somewhat better. There is less edema, less erythema. Still has 3 open areas covered with a necrotic surface requiring debridement. He claims to be using his compression pumps once a day his edema control is better 01/17/17; the patient's left posterior calf look better last week when I saw him and his wrap was changed 2 days ago. He has noted increasing pain in the left heel and arrives today with much larger wounds extensive erythema extending down into the entire heel area especially tender medially. He is not systemically unwell CBGs  have been controlled no fever. Our intake nurse showed me limegreen drainage on his AVD pads. 01/24/17; his usual this patient responds nicely to antibiotics last week giving him Levaquin for presumed Pseudomonas. The whole entire posterior part of his leg is much better much less inflamed and in the case of his Achilles heel area much less tender. He has also had some epithelialization posteriorly there are still open areas here and still draining but overall considerably better 01/31/17- He has continue to tolerate the compression wraps. he states that he continues to use the lymphedema pumps daily, and can increase to twice daily on the weekends. He is voicing no complaints or concerns regarding his LLE ulcers 02/07/17-he is here for follow-up evaluation. He states that he noted some erythema to the left medial and anterior thigh, which he states is new as of yesterday. He is concerned about recurrent cellulitis. He states his blood sugars have been slightly elevated, this morning in the 180s 02/14/17; he is here for follow-up evaluation. When he was last here there was erythema superiorly from his posterior wound in his anterior thigh. He was prescribed Levaquin however a culture of the wound surface grew MRSA over the phone I changed him to doxycycline on Monday and things seem to be a  lot better. 02/24/17; patient missed his appointment on Friday therefore we changed his nurse visit into a physician visit today. Still using silver alginate on the large area of the posterior left thigh. He isn't new area on the dorsal left second toe 03/03/17; actually better today although he admits he has not used his external compression pumps in the last 2 days or so because of work responsibilities over the weekend. 03/10/17; continued improvement. External compression pumps once a day almost all of his wounds have closed on the posterior left calf. Better edema control 03/17/17; in general improved. He still  has 3 small open areas on the lateral aspect of his left leg however most of the area on the posterior part of his leg is epithelialized. He has better edema control. He has an ABD pad under his stocking on the right anterior lower leg although he did not let us look at that today. 03/24/17; patient arrives back in clinic today with no open areas however there are areas on the posterior left calf and anterior left calf that are less than 100% epithelialized. His edema is well controlled in the left lower leg. There is some pitting edema probably lymphedema in the left upper thigh. He uses compression pumps at home once per day. I tried to get him to do this twice a day although he is very reticent. 04/01/2017 -- for the last 2 days he's had significant redness, tenderness and weeping and came in for an urgent visit today. 04/07/17; patient still has 6 more days of doxycycline. He was seen by Dr. Meyer Russel last Wednesday for cellulitis involving the posterior aspect, lateral aspect of his Involving his heel. For the most part he is better there is less erythema and less weeping. He has been on his feet for 12 hours 2 over the weekend. Using his compression pumps once a day 04/14/17 arrives today with continued improvement. Only one area on the posterior left calf that is not fully epithelialized. He has intense bilateral venous inflammation associated with his chronic venous insufficiency disease and secondary lymphedema. We have been using silver alginate to the left posterior calf wound In passing he tells [place] today that the right leg but we have not seen in quite some time has an open area on it but he doesn't want [place] to look at this today states he will show this to [place] next week. 04/21/17; there is no open area on his left leg although he still reports some weeping edema. He showed [place] his right leg today which is the first time we've seen this leg in a long time. He has a large area of open wound on the  right leg anteriorly healthy granulation. Quite a bit of swelling in the right leg and some degree of venous inflammation. He told us about the right leg in passing last week but states that deterioration in the right leg really only happened over the weekend 04/28/17; there is no open area on the left leg although there is an irritated part on the posterior which is like a wrap injury. The wound on the right leg which was new from last week at least to [place] is a lot better. 05/05/17; still no open area on the left leg. Patient is using his new compression stocking which seems to be doing a good job of controlling the edema. He states he is using his compression pumps once per day. The right leg still has an open wound although it  is better in terms of surface area. Required debridement. A lot of pain in the posterior right Achilles marked tenderness. Usually this type of presentation this patient gives concern for an active cellulitis 05/12/17; patient arrives today with his major wound from last week on the right lateral leg somewhat better. Still requiring debridement. He was using his compression stocking on the left leg however that is reopened with superficial wounds anteriorly he did not have an open wound on this leg previously. He is still using his juxta light's once daily at night. He cannot find the time to do this in the morning as he has to be at work by 7 AM 05/19/17; right lateral leg wound looks improved. No debridement required. The concerning area is on the left posterior leg which appears to almost have a subcutaneous hemorrhagic component to it. We've been using silver alginate to all the wounds 05/26/17; the right lateral leg wound continues to look improved. However the area on the left posterior calf is a tightly adherent surface. [place] using silver alginate. Because of the weeping edema in his legs there is very little good alternatives. 06/02/17; the patient left here last week  looking quite good. Major wound on the left posterior calf and a small one on the right lateral calf. Both of these look satisfactory. He tells me that by Wednesday he had noted increased pain in the left leg and drainage. He called on Thursday and Friday to get an appointment here but we were blocked. He did not go to urgent care or his primary physician. He thinks he had a fever on Thursday but did not actually take his temperature. He has not been using his compression pumps on the left leg because of pain. I advised him to go to the emergency room today for IV antibiotics for stents of left leg cellulitis but he has refused I have asked him to take 2 days off work to keep his leg elevated and he has refused this as well. In view of this I'm going to call him and Augmentin and doxycycline. 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 However after a detailed discussion regarding the risk benefits and alternatives he refuses to get admitted to the hospital. With no other recourse I will continue him on oral antibiotics as before and hopefully he'll have his infectious disease consultation this week. 06/16/2017 -- the patient was seen today by the nurse practitioner at infectious disease Ms. Dixon. 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 He has also been advise Lotrimin cream locally. she also had wise IV antibiotics for his condition if it flares up 06/23/17; patient arrives today with drainage bilaterally although the remaining wound on the left posterior calf after cleaning up  today "highlighter yellow drainage" did not look too bad. Unfortunately he has had breakdown on the right anterior leg [previously this leg had not been open and he is using a black stocking] he went to see infectious disease and is been put on clindamycin 150 mg daily, I did not verify the  dose although I'm not familiar with using clindamycin in this dosing range, perhaps for prophylaxisoo 06/27/17; I brought this patient back today to follow-up on the wound deterioration on the right lower leg together with surrounding cellulitis. I started him on doxycycline 4 days ago. This area looks better however he comes in today with intense cellulitis on the medial part of his left thigh. This is not have a wound in this area. Extremely tender. We've been using silver alginate to the wounds on the right lower leg left lower leg with bilateral 4 layer compression he is using his external compression pumps once a day 07/04/17; patient's left medial thigh cellulitis looks better. He has not been using his compression pumps as his insert said it was contraindicated with cellulitis. His right leg continues to make improvements all the wounds are still open. We only have one remaining wound on the left posterior calf. Using silver alginate to all open areas. He is on doxycycline which I started a week ago and should be finishing I gave him Augmentin after Thursday's visit for the severe cellulitis on the left medial thigh which fortunately looks better 07/14/17; the patient's left medial thigh cellulitis has resolved. The cellulitis in his right lower calf on the right also looks better. All of his wounds are stable to improved we've been using silver alginate he has completed the antibiotics I have given him. He has clindamycin 150 mg once a day prescribed by infectious disease for prophylaxis, I've advised him to start this now. We have been using bilateral Unna boots over silver alginate to the wound  areas 07/21/17; the patient is been to see infectious disease who noted his recurrent problems with cellulitis. He was not able to tolerate prophylactic clindamycin therefore he is on amoxicillin 500 twice a day. He also had a second daily dose of Lasix added By Dr. Melford Aase but he is not taking this. Nor is he being completely compliant with his compression pumps a especially not this week. He has 2 remaining wounds one on the right posterior lateral lower leg and one on the left posterior medial lower leg. 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 The patient has Unna boots bilaterally. 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 He has bilateral Unna boots for compression and silver alginate to his wounds. Arrives today with his legs looking as good as I have seen him in quite some time. Not surprisingly his wounds look better as well with improvement on the right lateral leg venous insufficiency wound and also the left medial leg. He is still using the compression pumps once a day 08/11/17; both legs appear to be doing better wounds on the right lateral and left medial legs look better. Skin on the right leg quite good. He is been using silver alginate as the primary dressing. I'm going to use Anasept gel calcium alginate and maintain all the secondary dressings 08/18/17; the patient continues to actually do quite well. The area on his right lateral leg is just about closed the left medial also looks better although it is still moist in this area. His edema is well controlled we have been using Anasept gel with calcium alginate and the usual secondary dressings, 4 layer compression and once daily use of his compression  pumps "always been able to manage 09/01/17; the  patient continues to do reasonably well in spite of his trip to [place]. The area on the right lateral leg is epithelialized. Left is much better but still open. He has more edema and more chronic erythema on the left leg [venous inflammation] 09/08/17; he arrives today with no open wound on the right lateral leg and decently controlled edema. Unfortunately his left leg is not nearly as in his good situation as last week.he apparently had increasing edema starting on Saturday. He edema soaked through into his foot so used a plastic bag to walk around his home. The area on the medial right leg which was his open area is about the same however he has lost surface epithelium on the left lateral which is new and he has significant pain in the Achilles area of the left foot. He is already on amoxicillin chronically for prophylaxis of cellulitis in the left leg 09/15/17; he is completed a week of doxycycline and the cellulitis in the left posterior leg and Achilles area is as usual improved. He still has a lot of edema and fluid soaking through his dressings. There is no open wound on the right leg. He saw infectious disease NP today 09/22/17;As usual 1 we transition him from our compression wraps to his stockings things did not go well. He has several small open areas on the right leg. He states this was caused by the compression wrap on his skin although he did not wear this with the stockings over them. He has several superficial areas on the left leg medially laterally posteriorly. He does not have any evidence of active cellulitis especially involving the left Achilles The patient is traveling from [facility] Saturday going to [facility]. He states he isn't attempting to get an appointment with a heel objects wound center there to change his dressings. I am not completely certain whether this will work 10/06/17; the patient came in on Friday for a nurse visit and the nurse reported that his legs  actually look quite good. He arrives in clinic today for his regular follow-up visit. He has a new wound on his left third toe over the PIP probably caused by friction with his footwear. He has small areas on the left leg and a very superficial but epithelialized area on the right anterior lateral lower leg. Other than that his legs look as good as I've seen him in quite some time. We have been using silver alginate Review of systems; no chest pain no shortness of breath other than this a 10 point review of systems negative 10/20/17; seen by Dr. Meyer Russel last week. He had taken some antibiotics [doxycycline] that he had left over. Dr. Meyer Russel thought he had candida infection and declined to give him further antibiotics. He has a small wound remaining on the right lateral leg several areas on the left leg including a larger area on the left posterior several left medial and anterior and a small wound on the left lateral. The area on the left dorsal third toe looks a lot better. ROS; Gen.; no fever, respiratory no cough no sputum Cardiac no chest pain other than this 10 point review of system is negative 10/30/17; patient arrives today having fallen in the bathtub 3 days ago. It took him a while to get up. He has pain and maceration in the wounds on his left leg which have deteriorated. He has not been using his pumps he also has some  maceration on the right lateral leg. 11/03/17; patient continues to have weeping edema especially in the left leg. This saturates his dressings which were just put on on 12/27. As usual the doxycycline seems to take care of the cellulitis on his lower leg. He is not complaining of fever, chills, or other systemic symptoms. He states his leg feels a lot better on the doxycycline I gave him empirically. He also apparently gets injections at his primary doctor's officeo Rocephin for cellulitis prophylaxis. I didn't ask him about his compression pump compliance today I think  that's probably marginal. Arrives in the clinic with all of his dressings primary and secondary macerated full of fluid and he has bilateral edema 11/10/17; the patient's right leg looks some better although there is still a cluster of wounds on the right lateral. The left leg is inflamed with almost circumferential skin loss medially to laterally although we are still maintaining anteriorly. He does not have overt cellulitis there is a lot of drainage. He is not using compression pumps. We have been using silver alginate to the wound areas, there are not a lot of options here 11/17/17; the patient's right leg continues to be stable although there is still open wounds, better than last week. The inflammation in the left leg is better. Still loss of surface layer epithelium especially posteriorly. There is no overt cellulitis in the amount of edema and his left leg is really quite good, tells me he is using his compression pumps once a day. 11/24/17; patient's right leg has a small superficial wound laterally this continues to improve. The inflammation in the left leg is still improving however we have continuous surface layer epithelial loss posteriorly. There is no overt cellulitis in the amount of edema in both legs is really quite good. He states he is using his compression pumps on the left leg once a day for 5 out of 7 days 12/01/17; very small superficial areas on the right lateral leg continue to improve. Edema control in both legs is better today. He has continued loss of surface epithelialization and left posterior calf although I think this is better. We have been using silver alginate with large number of absorptive secondary dressings 4 layer on the left Unna boot on the right at his request. He tells me he is using his compression pumps once a day 12/08/17; he has no open area on the right leg is edema control is good here. On the left leg however he has marked erythema and tenderness  breakdown of skin. He has what appears to be a wrap injury just distal to the popliteal fossa. 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 There was swelling erythema and breakdown in the right leg. He has much worse skin breakdown in the left leg as well multiple open areas medially and posteriorly as well as laterally. He tells me he has been using his compression pumps but tells me he feels that the drainage out of his leg is worse when he uses a compression pumps. To be fair to him he is been saying this for a while however I don't know that I have really been listening to this. I wonder if the compression pumps are  working properly 12/22/17;. Once again he arrives with severe erythema, weeping edema from the left greater than right leg. Noncompliance with compression pumps. New this visit he is complaining of pain on the lateral aspect of the right leg and the medial aspect of his right thigh. He apparently saw his cardiologist Dr. [name] who was ordered an echocardiogram area and I think this is a step in the right direction 12/25/17; started his doxycycline Monday night. There is still intense erythema of the right leg especially in the anterior thigh although there is less tenderness. The erythema around the wound on the right lateral calf also is less tender. He still complaining of pain in the left heel. His wounds are about the same right lateral left medial left lateral. Superficial but certainly not close to closure. He denies being systemically unwell no fever chills no abdominal pain no diarrhea 12/29/17; back in follow-up of his extensive right calf and right thigh cellulitis. I added amoxicillin to cover  possible doxycycline resistant strep. This seems to of done the trick he is in much less pain there is much less erythema and swelling. He has his echocardiogram at 11:00 this morning. X-ray of the left heel was also negative. 01/05/18; the patient arrived with his edema under much better control. Now that he is retired he is able to use his compression pumps daily and sometimes twice a day per the patient. He has a wound on the right leg the lateral wound looks better. Area on the left leg also looks a lot better. He has no evidence of cellulitis in his bilateral thighs I had a quick peak at his echocardiogram. He is in normal ejection fraction and normal left ventricular function. He has moderate pulmonary hypertension moderately reduced right ventricular function. One would have to wonder about chronic sleep apnea although he says he doesn't snore. He'll review the echocardiogram with his cardiologist. 01/12/18; the patient arrives with the edema in both legs under exemplary control. He is using his compression pumps daily and sometimes twice daily. His wound on the right lateral leg is just about closed. He still has some weeping areas on the posterior left calf and lateral left calf although everything is just about closed here as well. I have spoken with Aldean Baker who is the patient's nurse practitioner and infectious disease. She was concerned that the patient had not understood that the parenteral penicillin injections he was receiving for cellulitis prophylaxis was actually benefiting him. I don't think the patient actually saw that I would tend to agree we were certainly dealing with less infections although he had a serious one last month. 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 He is healed. He'll be placed in juxtalite compression wraps and increase his lymphedema pumps to twice daily. We will follow up again next week to ensure there are no  issues with the new regiment. 01/20/18-he is here for evaluation of bilateral lower extremity weeping edema. Yesterday he was placed in compression wrap to the right lower extremity and compression stocking to left lower shrubbery. He states he uses lymphedema pumps last night and again this morning and noted a blister to the left lower extremity. On exam he was noted to have drainage to the right lower extremity. He will be placed in Unna boots bilaterally and follow-up next week 01/26/18; patient was actually discharged a week ago to his own juxta light stockings only to return the next day with bilateral lower  extremity weeping edema.he was placed in bilateral Unna boots. He arrives today with pain in the back of his left leg. There is no open area on the right leg however there is a linear/wrap injury on the left leg and weeping edema on the left leg posteriorly. I spoke with infectious disease about 10 days ago. They were disappointed that the patient elected to discontinue prophylactic intramuscular penicillin shots as they felt it was particularly beneficial in reducing the frequency of his cellulitis. I discussed this with the patient today. He does not share this view. He'll definitely need antibiotics today. Finally he is traveling to [place] and trauma leaving this Saturday and returning a week later and he does not travel with his pumps. He is going by car 01/30/18; patient was seen 4 days ago and brought back in today for review of cellulitis in the left leg posteriorly. I put him on amoxicillin this really hasn't helped as much as I might like. He is also worried because he is traveling to [facility] trauma by car. Finally we will be rewrapping him. There is no open area on the right leg over his left leg has multiple weeping areas as usual 02/09/18; The same wrap on for 10 days. He did not pick up the last doxycycline I prescribed for him. He apparently took 4 days worth he already had.  There is nothing open on his right leg and the edema control is really quite good. He's had damage in the left leg medially and laterally especially probably related to the prolonged use of Unna boots 02/12/18; the patient arrived in clinic today for a nurse visit/wrap change. He complained of a lot of pain in the left posterior calf. He is taking doxycycline that I previously prescribed for him. Unfortunately even though he used his stockings and apparently used to compression pumps twice a day he has weeping edema coming out of the lateral part of his right leg. This is coming from the lower anterior lateral skin area. 02/16/18; the patient has finished his doxycycline and will finish the amoxicillin 2 days. The area of cellulitis in the left calf posteriorly has resolved. He is no longer having any pain. He tells me he is using his compression pumps at least once a day sometimes twice. 02/23/18; the patient finished his doxycycline and Amoxil last week. On Friday he noticed a small erythematous circle about the size of a quarter on the left lower leg just above his ankle. This rapidly expanded and he now has erythema on the lateral and posterior part of the thigh. This is bright red. Also has an area on the dorsal foot just above his toes and a tender area just below the left popliteal fossa. He came off his prophylactic penicillin injections at his own insistence one or 2 months ago. This is obviously deteriorated since then 03/02/18; patient is on doxycycline and Amoxil. Culture I did last week of the weeping area on the back of his left calf grew group B strep. I have therefore renewed the amoxicillin 500 3 times a day for a further week. He has not been systemically unwell. Still complaining of an area of discomfort right under his left popliteal fossa. There is no open wound on the right leg. He tells me that he is using his pumps twice a day on most days 03/09/18; patient arrives in clinic  today completing his amoxicillin today. The cellulitis on his left leg is better. Furthermore he tells me that  he had intramuscular penicillin shots that his primary care office today. However he also states that the wrap on his right leg fell down shortly after leaving clinic last week. He developed a large blister that was present when he came in for a nurse visit later in the week and then he developed intense discomfort around this area.He tells me he is using his compression pumps 03/16/18; the patient has completed his doxycycline. The infectious part of this/cellulitis in the left heel area left popliteal area is a lot better. He has 2 open areas on the right calf. Still areas on the left calf but this is a lot better as well. 03/24/18; the patient arrives complaining of pain in the left popliteal area again. He thinks some of this is wrap injury. He has no open area on the right leg and really no open area on the left calf either except for the popliteal area. He claims to be compliant with the compression pumps 03/31/18; I gave him doxycycline last week because of cellulitis in the left popliteal area. This is a lot better although the surface epithelium is denuded off and response to this. He arrives today with uncontrolled edema in the right calf area as well as a fingernail injury in the right lateral calf. There is only a few open areas on the left 04/06/18; I gave him amoxicillin doxycycline over the last 2 weeks that the amoxicillin should be completing currently. He is not complaining of any pain or systemic symptoms. The only open areas see has is on the right lateral lower leg paradoxically I cannot see anything on the left lower leg. He tells me he is using his compression pumps twice a day on most days. Silver alginate to the wounds that are open under 4 layer compression 04/13/18; he completed antibiotics and has no new complaints. Using his compression pumps. Silver alginate  that anything that's opened 04/20/18; he is using his compression pumps religiously. Silver alginate 4 layer compression anything that's opened. He comes in today with no open wounds on the left leg but 3 on the right including a new one posteriorly. He has 2 on the right lateral and one on the right posterior. He likes Unna boots on the right leg for reasons that aren't really clear we had the usual 4 layer compression on the left. It may be necessary to move to the 4 layer compression on the right however for now I left them in the Unna boots 04/27/18; he is using his compression pumps at least once a day. He has still the wounds on the right lateral calf. The area right posteriorly has closed. He does not have an open wound on the left under 4 layer compression however on the dorsal left foot just proximal to the toes and the left third toe 2 small open areas were identified 05/11/18; he has not uses compression pumps. The areas on the right lateral calf have coalesced into one large wound necrotic surface. On the left side he has one small wound anteriorly however the edema is now weeping out of a large part of his left leg. He says he wasn't using his pumps because of the weeping fluid. I explained to him that this is the time he needs to pump more 05/18/18; patient states he is using his compression pumps twice a day. The area on the right lateral large wound albeit superficial. On the left side he has innumerable number of small new wounds on  the left calf particularly laterally but several anteriorly and medially. All these appear to have healthy granulated base these look like the remnants of blisters however they occurred under compression. The patient arrives in clinic today with his legs somewhat better. There is certainly less edema, less multiple open areas on the left calf and the right anterior leg looks somewhat better as well superficial and a little smaller. However he relates pain  and erythema over the last 3-4 days in the thigh and I looked at this today. He has not been systemically unwell no fever no chills no change in blood sugar values 05/25/18; comes in today in a better state. The severe cellulitis on his left leg seems better with the Keflex. Not as tender. He has not been systemically unwell Hard to find an open wound on the left lower leg using his compression pumps twice a day The confluent wounds on his right lateral calf somewhat better looking. These will ultimately need debridement I didn't do this today. 06/01/18; the severe cellulitis on the left anterior thigh has resolved and he is completed his Keflex. There is no open wound on the left leg however there is a superficial excoriation at the base of the third toe dorsally. Skin on the bottom of his left foot is macerated looking. The left the wounds on the lateral right leg actually looks some better although he did require debridement of the top half of this wound area with an open curet 06/09/18 on evaluation today patient appears to be doing poorly in regard to his right lower extremity in particular this appears to likely be infected he has very thick purulent discharge along with a bright green tent to the discharge. This makes me concerned about the possibility of pseudomonas. He's also having increased discomfort at this point on evaluation. Fortunately there does not appear to be any evidence of infection spreading to the other location at this time. 06/16/18 on evaluation today patient appears to actually be doing fairly well. His ulcer has actually diminished in size quite significantly at this point which is good news. Nonetheless he still does have some evidence of infection he did see infectious disease this morning before coming here for his appointment. I did review the results of their evaluation and their note today. They did actually have him discontinue the Cipro and initiate treatment with  linezolid at this time. He is doing this for the next seven days and they recommended a follow-up in four months with them. He is the keep a log of the need for intermittent antibiotic therapy between now and when he falls back up with infectious disease. This will help them gaze what exactly they need to do to try and help them out. 06/23/18; the patient arrives today with no open wounds on the left leg and left third toe healed. He is been using his compression pumps twice a day. On the right lateral leg he still has a sizable wound but this is a lot better than last time I saw this. 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 Has been using silver alginate under 4 layer compression 06/30/18; the only open wound he has is on the right lateral leg and this looks healthy. No debridement is required. We have been using silver alginate. He does not have an open wound on the left leg. There is apparently some drainage  from the dorsal proximal third toe on the left although I see no open wound here. 07/03/18 on evaluation today patient was actually here just for a nurse visit rapid change. However when he was here on Wednesday for his rat change due to having been healed on the left and then developing blisters we initiated the wrap again knowing that he would be back today for [place] to reevaluate and see were at. Unfortunately he has developed some cellulitis into the proximal portion of his right lower extremity even into the region of his thigh. 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 He was placed on one as what at that point. Nonetheless he is done with that and has been tolerating it well otherwise. Doxycycline which in the past really did not seem to be effective for him. Nonetheless I think the best option may be for [place] to definitely reinitiate the  antibiotics for a longer period of time. 07/07/18; since I last saw this patient a week ago he has had a difficult time. At that point he did not have an open wound on his left leg. We transitioned him into juxta light stockings. He was apparently in the clinic the next day with blisters on the left lateral and left medial lower calf. He also had weeping edema fluid. He was put back into a compression wrap. He was also in the clinic on Friday with intense erythema in his right thigh. Per the patient he was started on Bactrim however that didn't work at all in terms of relieving his pain and swelling. He has taken 3 doxycycline that he had left over from last time and that seems to of helped. He has blistering on the right thigh as well. 07/14/18; the erythema on his right thigh has gotten better with doxycycline that he is finishing. 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 Presumably this cellulitis in the thigh was not related to that although I think this is a bit concerning going forward. He still has an area on the right lateral calf the blister on the right medial calf just below the knee that was discussed above. On the left 2 small open areas left medial and left lateral. Edema control is adequate. He is using his compression pumps twice a day 07/20/18; continued improvement in the condition of both legs especially the edema in his bilateral thighs. He tells me he is been losing weight through a combination of diet and exercise. He is using his compression pumps twice a day. So overall she made to the remaining wounds 07/27/2018; continued improvement in condition of both legs. His edema is well controlled. The area on the right lateral leg is just about closed he had one blisters show up on the medial left upper calf. We have him in 4 layer compression. He is going on a 10-day trip to [place], [place] and  [place]. He will be driving. He wants to wear Unna boots because of the lessening amount of constriction. He will not use compression pumps while he is away 08/05/18 on evaluation today patient actually appears to be doing decently well all things considered in regard to his bilateral lower extremities. The worst ulcer is actually only posterior aspect of his left lower extremity with a four layer compression wrap cut into his leg a couple weeks back. He did have  a trip and actually had [employer] wraps for the trip that he is worn since he was last here. Nonetheless he feels like the [employer] wraps actually do better for him his swelling is up a little bit but he also with his trip was not taking his Lasix on a regular set schedule like he was supposed to be. He states that obviously the reason being that he cannot drive and keep going without having to urinate too frequently which makes it difficult. He did not have his pumps with him while he was away either which I think also maybe playing a role here too. 08/13/2018; the patient only has a small open wound on the right lateral calf which is a big improvement in the last month or 2. He also has the area posteriorly just below the posterior fossa on the left which I think was a wrap injury from several weeks ago. He has no current evidence of cellulitis. He tells me he is back into his compression pumps twice a day. He also tells me that while he was at the laundromat somebody stole a section of his extremitease stockings 08/20/2018; back in the clinic with a much improved state. He only has small areas on the right lateral mid calf which is just about healed. This was is more substantial area for quite a prolonged period of time. He has a small open area on the left anterior tibia. The area on the posterior calf just below the popliteal fossa is closed today. He is using his compression pumps twice a day 08/28/2018; patient has no open wound  on the right leg. He has a smattering of open areas on the calf with some weeping lymphedema. More problematically than that it looks as though his wraps of slipped down in his usual he has very angry upper area of edema just below the right medial knee and on the right lateral calf. He has no open area on his feet. [place] I will send him in an antibiotic. We will continue to wrap the right leg. We ordered extremitease stockings for him last week and I plan to transition the right leg to a stocking when he gets home which will be in 10 days time. As usual he is very reluctant to take his pumps with him when he travels 09/07/2018; [place] He shows me a picture of his left leg in the mid part of his trip last week with intense fire engine erythema. The picture look bad enough I would have considered sending him to the hospital. Instead he went [place] They did not prescribe him antibiotics but he did take some doxycycline he had leftover from a previous visit. I had given him trimethoprim sulfamethoxazole before he left this did not work according to the patient. This is resulted in some improvement fortunately. He comes back with a large wound on the left posterior calf. Smaller area on the left anterior tibia. Denuded blisters on the dorsal left foot over his toes. Does not have much in the way of wounds on the right leg although he does have a very tender area on the right posterior area just below the popliteal fossa also suggestive of infection. He promises me he is back on his pumps twice a day 09/15/2018; the intense cellulitis in his left lower calf is a lot better. The wound area on  the posterior left calf is also so better. However he has reasonably extensive wounds on the dorsal aspect of his second and third toes and the proximal foot just at the base of the toes. There is  nothing open on the right leg 09/22/2018; the patient has excellent edema control in his legs bilaterally. He is using his external compression pumps twice a day. He has no open area on the right leg and only the areas in the left foot dorsally second and third toe area on the left side. He does not have any signs of active cellulitis. 10/06/2018; the patient has good edema control bilaterally. He has no open wound on the right leg. There is a blister in the posterior aspect of his left calf that we had to deal with today. He is using his compression pumps twice a day. There is no signs of active cellulitis. We have been using silver alginate to the wound areas. He still has vulnerable areas on the base of his left first second toes dorsally He has a his extremities stockings and we are going to transition him today into the stocking on the right leg. He is cautioned that he will need to continue to use the compression pumps twice a day. If he notices uncontrolled edema in the right leg he may need to go to 3 times a day. 10/13/2018; the patient came in for a nurse check on Friday he has a large flaccid blister on the right medial calf just below the knee. We unroofed this. He has this and a new area underneath the posterior mid calf which was undoubtedly a blister as well. He also has several small areas on the right which is the area we put his extremities stocking on. 10/19/2018; the patient went to see infectious disease this morning I am not sure if that was a routine follow-up in any case the doxycycline I had given him was discontinued and started on linezolid. He has not started this. It is easy to look at his left calf and the inflammation and think this is cellulitis however he is very tender in the tissue just below the popliteal fossa and I have no doubt that there is infection going on here. He states the problem he is having is that with the compression pumps the edema goes down and  then starts walking the wrap falls down. We will see if we can adhere this. He has 1 or 2 minuscule open areas on the right still areas that are weeping on the posterior left calf, the base of his left second and third toes 10/26/18; back today in clinic with quite of skin breakdown in his left anterior leg. This may have been infection the area below the popliteal fossa seems a lot better however tremendous epithelial loss on the left anterior mid tibia area over quite inexpensive tissue. He has 2 blisters on the right side but no other open wound here. 10/29/2018; came in urgently to see [place] today and we worked him in for review. He states that the 4 layer compression on the right leg caused pain he had to cut it down to roughly his mid calf this caused swelling above the wrap and he has blisters and skin breakdown today. As a result of the pain he has not been using his pumps. Both legs are a lot more edematous and there is a lot of weeping fluid. 11/02/18; arrives in clinic with continued difficulties in the right leg> left.  Leg is swollen and painful. multiple skin blisters and new open areas especially laterally. He has not been using his pumps on the right leg. 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 He is not systemically unwell. 11/09/2018; the patient claims he is being compliant with his pumps. He is finished the doxycycline I gave him last week. Culture I did of the wound on the right lateral leg showed a few very resistant methicillin staph aureus. This was resistant to doxycycline. Nevertheless he states the pain in the leg is a lot better which makes me wonder if the cultured organism was not really what was causing the problem nevertheless this is a very dangerous organism to be culturing out of any wound. His right leg is still a lot larger than the left. He is using an Radio broadcast assistant on this area, he blames a 4-layer compression for  causing the original skin breakdown which I doubt is true however I cannot talk him out of it. We have been using silver alginate to all of these areas which were initially blisters 11/16/2018; patient is being compliant with his external compression pumps at twice a day. Miraculously he arrives in clinic today with absolutely no open wounds. He has better edema control on the left where he has been using 4 layer compression versus wound of wounds on the right and I pointed this out to him. There is no inflammation in the skin in his lower legs which is also somewhat unusual for him. There is no open wounds on the dorsal left foot. He has extremitease stockings at home and I have asked him to bring these in next week. 11/25/18 patient's lower extremity on examination today on the left appears for the most part to be wound free. He does have an open wound on the lateral aspect of the right lower extremity but this is minimal compared to what I've seen in past. He does request that we go ahead and wrap the left leg as well even though there's nothing open just so hopefully it will not reopen in short order. 1/28; patient has superficial open wounds on the right lateral calf left anterior calf and left posterior calf. His edema control is adequate. 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 We have been using silver alginate as the primary dressing. He claims compliance with his compression pumps 2/4; patient has superficial open wounds on numerous areas of his left calf and again one on the left dorsal foot. The areas on the right lateral calf have healed. The cellulitis that I gave him doxycycline for last week is also resolved this was mostly on the left anterior calf just below the tibial tuberosity. His edema looks fairly well-controlled. He tells me he went to see his primary doctor today and had blood work  ordered 2/11; once again he has several open areas on the left calf left tibial area. Most of these are small and appear to have healthy granulation. He does not have anything open on the right. The edema and control in his thighs is pretty good which is usually a good indication he has been using his pumps as requested. 2/18; he continues to have several small areas on the left calf and left tibial area. Most of these are small healthy granulation. We put him in his stocking on the right leg last week and  he arrives with a superficial open area over the right upper tibia and a fairly large area on the right lateral tibia in similar condition. His edema control actually does not look too bad, he claims to be using his compression pumps twice a day 2/25. Continued small areas on the left calf and left tibial area. New areas especially on the right are identified just below the tibial tuberosity and on the right upper tibia itself. There are also areas of weeping edema fluid even without an obvious wound. He does not have a considerable degree of lymphedema but clearly there is more edema here than his skin can handle. He states he is using the pumps twice a day. We have an Unna boot on the right and 4 layer compression on the left. 3/3; he continues to have an area on the right lateral calf and right posterior calf just below the popliteal fossa. There is a fair amount of tenderness around the wound on the popliteal fossa but I did not see any evidence of cellulitis, could just be that the wrap came down and rubbed in this area. He does not have an open area on the left leg however there is an area on the left dorsal foot at the base of the third toe We have been using silver alginate to all wound areas 3/10; he did not have an open area on his left leg last time he was here a week ago. Today he arrives with a horizontal wound just below the tibial tuberosity and an area on the left lateral calf.  He has intense erythema and tenderness in this area. The area is on the right lateral calf and right posterior calf better than last week. We have been using silver alginate as usual 3/18 - Patient returns with 3 small open areas on left calf, and 1 small open area on right calf, the skin looks ok with no significant erythema, he continues the UNA boot on right and 4 layer compression on left. The right lateral calf wound is closed , the right posterior is small area. we will continue silver alginate to the areas. 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 He is been tolerating the dressing changes without complication. Fortunately she has made excellent improvement in regard to the overall status of his wounds. Unfortunately every time we cease wrapping him he ends up reopening in causing more significant issues at that point. Again I'm unsure of the best direction to take although I think the lymphedema clinic may be appropriate for him. 02/03/19 on evaluation today patient appears to be doing well in regard to the wounds that we saw him for last week unfortunately he has a new area on the proximal portion of his right medial/posterior lower extremity where the wrap somewhat slowed down and caused swelling and a blister to rub and open. Unfortunately this is the only opening that he has on either leg at this point. 02/17/19 on evaluation today patient's bilateral lower extremities appear to be doing well. He still completely healed in regard to the left lower extremity. In regard to the right lower extremity the area where the wrap and slid down and caused the blister still seems to be slightly open although this is dramatically better than during the last evaluation two weeks  ago. I'm very pleased with  the way this stands overall. 03/03/19 on evaluation today patient appears to be doing well in regard to his right lower extremity in general although he did have a new blister open this does not appear to be showing any evidence of active infection at this time. Fortunately there's No fevers, chills, nausea, or vomiting noted at this time. Overall I feel like he is making good progress it does feel like that the right leg will we perform the [employer] seems to do with a bit better than three layer wrap on the left which slid down on him. We may switch to doing bilateral in the book wraps. 5/4; I have not seen Mr. Johnson in quite some time. According to our case manager he did not have an open wound on his left leg last week. He had 1 remaining wound on the right posterior medial calf. He arrives today with multiple openings on the left leg probably were blisters and/or wrap injuries from Unna boots. I do not think the Unna boot's will provide adequate compression on the left. I am also not clear about the frequency he is using the compression pumps. 03/17/19 on evaluation today patient appears to be doing excellent in regard to his lower extremities compared to last week's evaluation apparently. He had gotten significantly worse last week which is unfortunate. The [employer] wrap on the left did not seem to do very well for him at all and in fact it didn't control his swelling significantly enough he had an additional outbreak. Subsequently we go back to the four layer compression wrap on the left. This is good news. At least in that he is doing better and the wound seem to be killing him. He still has not heard anything from the lymphedema clinic. 03/24/19 on evaluation today patient actually appears to be doing much better in regard to his bilateral lower Trinity as compared to last week when I saw him. Fortunately there's no signs of active infection at this time. He has been tolerating the  dressing changes without complication. Overall I'm extremely pleased with the progress and appearance in general. 04/07/19 on evaluation today patient appears to be doing well in regard to his bilateral lower extremities. His swelling is significantly down from where it was previous. With that being said he does have a couple blisters still open at this point but fortunately nothing that seems to be too severe and again the majority of the larger openings has healed at this time. 04/14/19 on evaluation today patient actually appears to be doing quite well in regard to his bilateral lower extremities in fact I'm not even sure there's anything significantly open at this time at any site. Nonetheless he did have some trouble with these wraps where they are somewhat irritating him secondary to the fact that he has noted that the graph wasn't too close down to the end of this foot in a little bit short as well up to his knee. Otherwise things seem to be doing quite well. 04/21/19 upon evaluation today patient's wound bed actually showed evidence of being completely healed in regard to both lower extremities which is excellent news. There does not appear to be any signs of active infection which is also good news. I'm very pleased in this regard. No fevers, chills, nausea, or vomiting noted at this time. 04/28/19 on evaluation today patient appears to be doing a little bit worse in regard to both lower extremities  on the left mainly due to the fact that when he went infection disease the wrap was not wrapped quite high enough he developed a blister above this. On the right he is a small open area of nothing too significant but again this is continuing to give him some trouble he has been were in the Velcro compression that he has at home. 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 He's been tolerating the dressing changes without complication.  Fortunately there's no signs of active infection at this time. No fevers, chills, nausea, or vomiting noted at this time. We have been trying to get an appointment with her lymphedema clinic in [facility] but unfortunately nobody can get them on phone with not been able to even fax information over the patient likewise is not been able to get in touch with them. Overall I'm not sure exactly what's going on here with to reach out again today. 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 Still having a lot of drainage unfortunately. He tells me especially in the left but even on the right. There's no signs of active infection which is good news we've been using so ratcheted up to this point. 05/19/19 on evaluation today patient actually appears to be doing quite well with regard to his left lower extremity which is great news. Fortunately in regard to the right lower extremity has an issues with his wrap and he subsequently did remove this from what I'm understanding. Nonetheless long story short is what he had rewrapped once he removed it subsequently had maggots underneath this wrap whenever he came in for evaluation today. With that being said they were obviously completely cleaned away by the nursing staff. The visit today which is excellent news. However he does appear to potentially have some infection around the right ankle region where the maggots were located as well. He will likely require anabiotic therapy today. 05/26/19 on evaluation today patient actually appears to be doing much better in regard to his bilateral lower extremities. I feel like the infection is under much better control. With that being said there were maggots noted when the wrap was removed yet again today. Again this could have potentially been left over from previous although at this time there does not appear to be any  signs of significant drainage there was obviously on the wrap some drainage as well this contracted gnats or otherwise. Either way I do not see anything that appears to be doing worse in my pinion and in fact I think his drainage has slowed down quite significantly likely mainly due to the fact to his infection being under better control. 06/02/2019 on evaluation today patient actually appears to be doing well with regard to his bilateral lower extremities there is no signs of active infection at this time which is great news. With that being said he does have several open areas more so on the right than the left but nonetheless these are all significantly better than previously noted. 06/09/2019 on evaluation today patient actually appears to be doing well. His wrap stayed up and he did not cause any problems he had more drainage on the right compared to the left but overall I do not see any major issues at this time which is great news. 06/16/2019 on evaluation today patient appears to be doing excellent with regard  to his lower extremities the only area that is open is a new blister that can have opened as of today on the medial ankle on the left. Other than this he really seems to be doing great I see no major issues at this point. 06/23/2019 on evaluation today patient appears to be doing quite well with regard to his bilateral lower extremities. In fact he actually appears to be almost completely healed there is a small area of weeping noted of the right lower extremity just above the ankle. Nonetheless fortunately there is no signs of active infection at this time which is good news. No fevers, chills, nausea, vomiting, or diarrhea. 8/24; the patient arrived for a nurse visit today but complained of very significant pain in the left leg and therefore I was asked to look at this. Noted that he did not have an open area on the left leg last week nevertheless this was wrapped. The patient states  that he is not been able to put his compression pumps on the left leg because of the discomfort. He has not been systemically unwell 06/30/2019 on evaluation today patient unfortunately despite being excellent last week is doing much worse with regard to his left lower extremity today. In fact he had to come in for a nurse on Monday where his left leg had to be rewrapped due to excessive weeping Dr. Leanord Hawking placed him on doxycycline at that point. Fortunately there is no signs of active infection Systemically at this time which is good news. 07/07/2019 in regard to the patient's wounds today he actually seems to be doing well with his right lower extremity there really is nothing open or draining at this point this is great news. Unfortunately the left lower extremity is given him additional trouble at this time. There does not appear to be any signs of active infection nonetheless he does have a lot of edema and swelling noted at this point as well as blistering all of which has led to a much more poor appearing leg at this time compared to where it was 2 weeks ago when it was almost completely healed. Obviously this is a little discouraging for the patient. He is try to contact the lymphedema clinic in [place] he has not been able to get through to them. 07/14/2019 on evaluation today patient actually appears to be doing slightly better with regard to his left lower extremity ulcers. Overall I do feel like at least at the top of the wrap that we have been placing this area has healed quite nicely and looks much better. The remainder of the leg is showing signs of improvement. Unfortunately in the thigh area he still has an open region on the left and again on the right he has been utilizing just a Band-Aid on an area that also opened on the thigh. Again this is an area that were not able to wrap although we did do an Ace wrap to provide some compression that something that obviously is a little  less effective than the compression wraps we have been using on the lower portion of the leg. He does have an appointment with the lymphedema clinic in [facility] on Friday. 07/21/2019 on evaluation today patient appears to be doing better with regard to his lower extremity ulcers. He has been tolerating the dressing changes without complication. Fortunately there is no signs of active infection at this time. No fevers, chills, nausea, vomiting, or diarrhea. I did receive the paperwork from the  physical therapist [place]. Subsequently I signed off on that this morning and sent that back to him for further progression with the treatment plan. 07/28/2019 on evaluation today patient appears to be doing very well with regard to his right lower extremity where I do not see any open wounds at this point. Fortunately he is feeling great as far as that is concerned as well. In regard to the left lower extremity he has been having issues with still several areas of weeping and edema although the upper leg is doing better his lower leg still I think is going require the compression wrap at this time. No fevers, chills, nausea, vomiting, or diarrhea. 08/04/2019 on evaluation today patient unfortunately is having new wounds on the right lower extremity. Again we have been using Unna boot wrap on that side. We switched him to using his juxta lite wrap at home. With that being said he tells me he has been using it although his legs extremely swollen and to be honest really does not appear that he has been. I cannot know that for sure however. Nonetheless he has multiple new wounds on the right lower extremity at this time. Obviously we will have to see about getting this rewrapped for him today. 08/11/2019 on evaluation today patient appears to be doing fairly well with regard to his wounds. He has been tolerating the dressing changes including the compression wraps without  complication. He still has a lot of edema in his upper thigh regions bilaterally he is supposed to be seeing the lymphedema clinic on the 15th of this month once his wraps arrive for the upper part of his legs. 08/18/2019 on evaluation today patient appears to be doing well with regard to his bilateral lower extremities at this point. He has been tolerating the dressing changes without complication. Fortunately there is no signs of active infection which is also good news. He does have a couple weeping areas on the first and second toe of the right foot he also has just a small area on the left foot upper leg and a small area on the left lower leg but overall he is doing quite well in my opinion. He is supposed to be getting his wraps shortly in fact tomorrow and then subsequently is seeing the lymphedema clinic next Wednesday on the 21st. Of note he is also leaving on the 25th to go on vacation for a [place] to the beach. For that reason and since there is some uncertainty about what there can be doing at lymphedema clinic next Wednesday I am get a make an appointment for next Friday here for us to see what we need to do for him prior to him leaving for vacation. 10/23; patient arrives in considerable pain predominantly in the upper posterior calf just distal to the popliteal fossa also in the wound anteriorly above the major wound. This is probably cellulitis and he has had this recurrently in the past. He has no open wound on the right side and he has had an Radio broadcast assistantUnna boot in that area. Finally I note that he has an area on the left posterior calf which by enlarge is mostly epithelialized. This protrudes beyond the borders of the surrounding skin in the setting of dry scaly skin and lymphedema. [place] Per his longstanding pattern, he will not take his compression pumps with him predominantly out of fear that they will be stolen. He  therefore asked that we put  a Unna boot back on the right leg. He will also contact the wound care center in [facility] to see if they can change his dressing in the mid week. 11/3; patient returned from his vacation to [facility]. He was seen on 1 occasion at their wound care center. They did a 2 layer compression system as they did not have our 4-layer wrap. I am not completely certain what they put on the wounds. They did not change the Unna boot on the right. The patient is also seeing a lymphedema specialist physical therapist in [place]. It appears that he has some compression sleeve for his thighs which indeed look quite a bit better than I am used to seeing. He pumps over these with his external compression pumps. 11/10; the patient has a new wound on the right medial thigh otherwise there is no open areas on the right. He has an area on the left leg posteriorly anteriorly and medially and an area over the left second toe. We have been using silver alginate. He thinks the injury on his thigh is secondary to friction from the compression sleeve he has. 11/17; the patient has a new wound on the right medial thigh last week. He thinks this is because he did not have a underlying stocking for his thigh juxta lite apparatus. He now has this. The area is fairly large and somewhat angry but I do not think he has underlying cellulitis. He has a intact blister on the right anterior tibial area. Small wound on the right great toe dorsally Small area on the medial left calf. 11/30; the patient does not have any open areas on his right leg and we did not take his juxta lite stocking off. However he states that on Friday his compression wrap fell down lodging around his upper mid calf area. As usual this creates a lot of problems for him. He called urgently today to be seen for a nurse visit however the nurse visit turned into a provider visit because of extreme erythema and pain in the left anterior tibia extending  laterally and posteriorly. The area that is problematic is extensive 10/06/2019 upon evaluation today patient actually appears to be doing poorly in regard to his left lower extremity. He Dr. Leanord Hawking did place him on doxycycline this past Monday apparently due to the fact that he was doing much worse in regard to this left leg. Fortunately the doxycycline does seem to be helping. Unfortunately we are still having a very difficult time getting his edema under any type of control in order to anticipate discharge at some point. The only way were really able to control his lymphedema really is with compression wraps and that has only even seemingly temporary. He has been seeing a lymphedema clinic they are trying to help in this regard but still this has been somewhat frustrating in general for the patient. 10/13/19 on evaluation today patient appears to be doing excellent with regard to his right lower extremity as far as the wounds are concerned. His swelling is still quite extensive unfortunately. He is still having a lot of drainage from the thigh areas bilaterally which is unfortunate. He's been going to lymphedema clinic but again he still really does not have this edema under control as far as his lower extremities are concern. With regard to his left lower extremity this seems to be improving and I do believe the doxycycline has been of benefit for him.  He is about to complete the doxycycline. 10/20/2019 on evaluation today patient appears to be doing poorly in regard to his bilateral lower extremities. More in the right thigh he has a lot of irritation at this site unfortunately. In regard to the left lower extremity the wrap was not quite as high it appears and does seem to have caused him some trouble as well. Fortunately there is no evidence of systemic infection though he does have some blue-green drainage which has me concerned for the possibility of Pseudomonas. He tells me he is  previously taking Cipro without complications and he really does not care for Levaquin however due to some of the side effects he has. He is not allergic to any medications specifically antibiotics that were aware of. 10/27/2019 on evaluation today patient actually does appear to be for the most part doing better when compared to last week's evaluation. With that being said he still has multiple open wounds over the bilateral lower extremities. He actually forgot to start taking the Cipro and states that he still has the whole bottle. He does have several new blisters on left lower extremity today I think I would recommend he go ahead and take the Cipro based on what I am seeing at this point. 12/30-Patient comes at 1 week visit, 4 layer compression wraps on the left and Unna boot on the right, primary dressing Xtrasorb and silver alginate. Patient is taking his Cipro and has a few more days left probably 5-6, and the legs are doing better. He states he is using his compressions devices which I believe he has 11/10/2019 on evaluation today patient actually appears to be much better than last time I saw him 2 weeks ago. His wounds are significantly improved and overall I am very pleased in this regard. Fortunately there is no signs of active infection at this time. He is just a couple of days away from completing Cipro. Overall his edema is much better he has been using his lymphedema pumps which I think is also helping at this point. 11/17/2019 on evaluation today patient appears to be doing excellent in regard to his wounds in general. His legs are swollen but not nearly as much as they have been in the past. Fortunately he is tolerating the compression wraps without complication. No fevers, chills, nausea, vomiting, or diarrhea. He does have some erythema however in the distal portion of his right lower extremity specifically around the forefoot and toes there is a little bit of warmth here as  well. 11/24/2019 on evaluation today patient appears to be doing well with regard to his right lower extremity I really do not see any open wounds at this point. His left lower extremity does have several open areas and his right medial thigh also is open. Other than this however overall the patient seems to be making good progress and I am very pleased at this point. Electronic Signature(s) Signed: 11/24/2019 11:34:26 AM By: Lenda Kelp PA-C Entered By: Lenda Kelp on 11/24/2019 11:34:25 -------------------------------------------------------------------------------- Physical Exam Details Patient Name: Date of Service: [name], [name] 11/24/2019 10:15 AM Medical Record [id number] Patient Account Number: [id number] Date of Birth/Sex: Treating RN: 1951/05/13 (69 y.o. M) Primary Care Provider: Nicoletta Ba Other Clinician: Referring Provider: Treating Provider/Extender:[name], [name], [name] in Treatment: 200 Constitutional Well-nourished and well-hydrated in no acute distress. Respiratory normal breathing without difficulty. Psychiatric this patient is able to make decisions and demonstrates good insight into disease process. Alert and  Oriented x 3. pleasant and cooperative. Notes His wound bed currently showed signs of great granulation at all wound locations there does not appear to be any signs of infection the distal part of his right foot seems to be doing much better as well which is also a positive thing. Fortunately there is no signs of active infection at this time. No fevers, chills, nausea, vomiting, or diarrhea. Electronic Signature(s) Signed: 11/24/2019 11:34:58 AM By: Lenda Kelp PA-C Entered By: Lenda Kelp on 11/24/2019 11:34:58 -------------------------------------------------------------------------------- Physician Orders Details Patient Name: Date of Service: DEMARIAN, EPPS 11/24/2019 10:15 AM Medical Record  [id number] Patient Account Number: [id number] Date of Birth/Sex: Treating RN: [birth year] (69 y.o. 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 Dressing Change Frequency Do not change entire dressing for one week. - both lower legs Skin Barriers/Peri-Wound Care Moisturizing lotion TCA Cream or Ointment - to legs mixed with lotion Wound Cleansing May shower with protection. Primary Wound Dressing Wound #145R Right,Medial Upper Leg Calcium Alginate with Silver Wound #156 Left,Dorsal Foot Calcium Alginate with Silver Wound #154 Left,Medial Lower Leg Calcium Alginate with Silver Secondary Dressing Wound #145R Right,Medial Upper Leg ABD pad - secured with tape Wound #154 Left,Medial Lower Leg ABD pad Wound #156 Left,Dorsal Foot Dry Gauze Edema Control 4 layer compression: Left lower extremity - unna boot at upper portion of lower leg. Unna Boot to Right Lower Extremity Avoid standing for long periods of time Elevate legs to the level of the heart or above for 30 minutes daily and/or when sitting, a frequency of: - throughout the day Exercise regularly Segmental Compressive Device. - lymphadema pumps 60 min 2 times per day Other: - lymphadema wraps to upper  leg per [facility] clinic Electronic Signature(s) Signed: 11/24/2019 6:03:18 PM By: Zenaida Deed RN, BSN Signed: 11/24/2019 7:03:16 PM By: Lenda Kelp PA-C Entered By: Zenaida Deed on 11/24/2019 11:34:30 -------------------------------------------------------------------------------- Problem List Details Patient Name: Date of Service: [name], [name] 11/24/2019 10:15 AM Medical Record [id number] Patient Account Number: [id number] Date of Birth/Sex: Treating RN: Mar 16, 1951 (69 y.o. M) Primary Care Provider: Nicoletta Ba Other Clinician: Referring Provider: Treating Provider/Extender:[name], [name], [name] in Treatment: 200 Active Problems ICD-10 Evaluated Encounter Code Description Active Date Today Diagnosis L97.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 right calf limited to 06/30/2018 No Yes breakdown of skin L97.221 Non-pressure chronic ulcer of left calf limited to 09/30/2016 No Yes breakdown of skin I87.333 Chronic venous hypertension (idiopathic) with ulcer 01/22/2016 No Yes and inflammation of bilateral lower extremity I89.0 Lymphedema, not elsewhere classified 01/22/2016 No Yes E11.622 Type 2 diabetes mellitus with other skin ulcer 01/22/2016 No Yes 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, 01/22/2016 No Yes unspecified L03.116 Cellulitis of left lower limb 04/01/2017 No Yes Inactive Problems ICD-10 Code Description Active Date Inactive Date 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 06/30/2017 skin L97.521 Non-pressure chronic ulcer of other part  of left foot limited to 04/27/2018 04/27/2018 breakdown of skin L03.115 Cellulitis of right lower limb 12/22/2017 12/22/2017 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 with other specified 06/30/2018 06/30/2018 severity L97.511 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 06/30/2018 06/30/2018 breakdown of skin Resolved  Problems Electronic Signature(s) Signed: 11/24/2019 10:28:16 AM By: Lenda Kelp PA-C Entered By: Lenda Kelp on 11/24/2019 10:28:14 -------------------------------------------------------------------------------- Progress Note Details Patient Name: Date of Service: [name], [name] 11/24/2019 10:15 AM Medical Record [id number] Patient Account Number: [id number] Date of Birth/Sex: Treating RN: 1951/03/04 (69 y.o. M) Primary Care Provider: Nicoletta Ba Other Clinician: Referring Provider: Treating Provider/Extender:[name], [name], [name] in Treatment: 200 Subjective Chief Complaint Information obtained from Patient patient is here for evaluation venous/lymphedema weeping History of Present Illness (HPI) Referred by PCP for consultation. Patient has long standing history of BLE venous stasis, no prior ulcerations. At beginning of month, developed cellulitis and weeping. Received IM Rocephin followed by Keflex and resolved. Wears compression stocking, appr 6 months old. Not sure strength. No present drainage. 01/22/16 this is a patient who is a type II diabetic on insulin. 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 He tells me he religiously wears pressure stockings of uncertain strength. He was here with weeping edema about 8 months ago but did not have an open wound. Roughly a month ago he had a reopening on his bilateral legs. He is been using bandages and Neosporin. He does not complain of pain. 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 Last BUN/creatinine I have is from 11/20/15 at 13 and 1.0 respectively 01/29/16; patient arrives today having tolerated the Profore wrap. He brought in his stockings and these are 18 mmHg stockings he bought from [place]. The compression here is likely inadequate. He does not complain of  pain or excessive drainage she has no systemic symptoms. The wound on the right looks improved as does the one on the left although one on the left is more substantial with still tissue at risk below the actual wound area on the bilateral posterior calf 02/05/16; patient arrives with poor edema control. He states that we did put a 4 layer compression on it last week. No weight appear 5 this. 02/12/16; the area on the posterior right Has healed. The left Has a substantial wound that has necrotic surface eschar that requires a debridement with a curette. 02/16/16;the patient called or a Nurse visit secondary to increased swelling. He had been in earlier in the week with his right leg healed. He was transitioned to is on pressure stocking on the right leg with the only open wound on the left, a substantial area on the left posterior calf. Note he has a history of severe lower extremity edema, he has a history of chronic atrial fibrillation but not heart failure per my notes but I'll need to research this. He is not complaining of chest pain shortness of breath or orthopnea. The intake nurse noted blisters on the previously closed right leg 02/19/16; this is the patient's regular visit day. I see him on Friday with escalating edema new wounds on the right leg and clear signs of at least right ventricular heart failure. I increased his Lasix to 40 twice a day. He is returning currently in follow-up. States he is noticed a decrease in that the edema 02/26/16 patient's  legs have much less edema. There is nothing really open on the right leg. The left leg has improved condition of the large superficial wound on the posterior left leg 03/04/16; edema control is very much better. The patient's right leg wounds have healed. On the left leg he continues to have severe venous inflammation on the posterior aspect of the left leg. There is no tenderness and I don't think any of this is cellulitis. 03/11/16; patient's  right leg is married healed and he is in his own stocking. The patient's left leg has deteriorated somewhat. There is a lot of erythema around the wound on the posterior left leg. There is also a significant rim of erythema posteriorly just above where the wrap would've ended there is a new wound in this location and a lot of tenderness. Can't rule out cellulitis in this area. 03/15/16; patient's right leg remains healed and he is in his own stocking. The patient's left leg is much better than last review. His major wound on the posterior aspect of his left Is almost fully epithelialized. He has 3 small injuries from the wraps. Really. Erythema seems a lot better on antibiotics 03/18/16; right leg remains healed and he is in his own stocking. The patient's left leg is much better. The area on the posterior aspect of the left calf is fully epithelialized. His 3 small injuries which were wrap injuries on the left are improved only one seems still open his erythema has resolved 03/25/16; patient's right leg remains healed and he is in his own stocking. There is no open area today on the left leg posterior leg is completely closed up. His wrap injuries at the superior aspect of his leg are also resolved. He looks as though he has some irritation on the dorsal ankle but this is fully epithelialized without evidence of infection. 03/28/16; we discharged this patient on Monday. Transitioned him into his own stocking. There were problems almost immediately with uncontrolled swelling weeping edema multiple some of which have opened. He does not feel systemically unwell in particular no chest pain no shortness of breath and he does not feel 04/08/16; the edema is under better control with the Profore light wrap but he still has pitting edema. There is one large wound anteriorly 2 on the medial aspect of his left leg and 3 small areas on the superior posterior calf. Drainage is not excessive he is tolerating a  Profore light well 04/15/16; put a Profore wrap on him last week. This is controlled is edema however he had a lot of pain on his left anterior foot most of his wounds are healed 04/22/16 once again the patient has denuded areas on the left anterior foot which he states are because his wrap slips up word. He saw his primary physician today is on Lasix 40 twice a day and states that he his weight is down 20 pounds over the last 3 months. 04/29/16: Much improved. left anterior foot much improved. He is now on Lasix 80 mg per day. Much improved edema control 05/06/16; I was hoping to be able to discharge him today however once again he has blisters at a low level of where the compression was placed last week mostly on his left lateral but also his left medial leg and a small area on the anterior part of the left foot. 05/09/16; apparently the patient went home after his appointment on 7/4 later in the evening developing pain in his upper medial thigh together  with subjective fever and chills although his temperature was not taken. The pain was so intense he felt he would probably have to call 911. However he then remembered that he had leftover doxycycline from a previous round of antibiotics and took these. By the next morning he felt a lot better. He called and spoke to one of our nurses and I approved doxycycline over the phone thinking that this was in relation to the wounds we had previously seen although they were definitely were not. The patient feels a lot better old fever no chills he is still working. Blood sugars are reasonably controlled 05/13/16; patient is back in for review of his cellulitis on his anterior medial upper thigh. He is taking doxycycline this is a lot better. Culture I did of the nodular area on the dorsal aspect of his foot grew MRSA this also looks a lot better. 05/20/16; the patient is cellulitis on the medial upper thigh has resolved. All of his wound areas including the  left anterior foot, areas on the medial aspect of the left calf and the lateral aspect of the calf at all resolved. He has a new blister on the left dorsal foot at the level of the fourth toe this was excised. No evidence of infection 05/27/16; patient continues to complain weeping edema. He has new blisterlike wounds on the left anterior lateral and posterior lateral calf at the top of his wrap levels. The area on his left anterior foot appears better. He is not complaining of fever, pain or pruritus in his feet. 05/30/16; the patient's blisters on his left anterior leg posterior calf all look improved. He did not increase the Lasix 100 mg as I suggested because he was going to run out of his 40 mg tablets. He is still having weeping edema of his toes 06/03/16; I renewed his Lasix at 80 mg once a day as he was about to run out when I last saw him. He is on 80 mg of Lasix now. I have asked him to cut down on the excessive amount of water he was drinking and asked him to drink according to his thirst mechanisms 06/12/2016 -- was seen 2 days ago and was supposed to wear his compression stockings at home but he is developed lymphedema and superficial blisters on the left lower extremity and hence came in for a review 06/24/16; the remaining wound is on his left anterior leg. He still has edema coming from between his toes. There is lymphedema here however his edema is generally better than when I last saw this. He has a history of atrial fibrillation but does not have a known history of congestive heart failure nevertheless I think he probably has this at least on a diastolic basis. 07/01/16 I reviewed his echocardiogram from January 2017. This was essentially normal. He did not have LVH, EF of 55-60%. His right ventricular function was normal although he did have trivial tricuspid and pulmonic regurgitation. This is not audible on exam however. I increased his Lasix to do massive edema in his legs well  above his knees I think in early July. He was also drinking an excessive amount of water at the time. 07/15/16; missed his appointment last week because of the Labor Day holiday on Monday. He could not get another appointment later in the week. Started to feel the wrap digging in superiorly so we remove the top half and the bottom half of his wrap. He has extensive erythema and blistering  superiorly in the left leg. Very tender. Very swollen. Edema in his foot with leaking edema fluid. He has not been systemically unwell 07/22/16; the area on the left leg laterally required some debridement. The medial wounds look more stable. His wrap injury wounds appear to have healed. Edema and his foot is better, weeping edema is also better. He tells me he is meeting with the supplier of the external compression pumps at work 08/05/16; the patient was on vacation last week in [facility]. His wrap is been on for an extended period of time. Also over the weekend he developed an extensive area of tender erythema across his anterior medial thigh. He took to doxycycline yesterday that he had leftover from a previous prescription. The patient complains of weeping edema coming out of his toes 08/08/16; I saw this patient on 10/2. He was tender across his anterior thigh. I put him on doxycycline. He returns today in follow-up. He does not have any open wounds on his lower leg, he still has edema weeping into his toes. 08/12/16; patient was seen back urgently today to follow-up for his extensive left thigh cellulitis/erysipelas. He comes back with a lot less swelling and erythema pain is much better. I believe I gave him Augmentin and Cipro. His wrap was cut down as he stated a roll down his legs. He developed blistering above the level of the wrap that remained. He has 2 open blisters and 1 intact. 08/19/16; patient is been doing his primary doctor who is increased his Lasix from 40-80 once a day or 80 already has  less edema. Cellulitis has remained improved in the left thigh. 2 open areas on the posterior left calf 08/26/16; he returns today having new open blisters on the anterior part of his left leg. He has his compression pumps but is not yet been shown how to use some vital representative from the supplier. 09/02/16 patient returns today with no open wounds on the left leg. Some maceration in his plantar toes 09/10/2016 -- Dr. Leanord Hawking had recently discharged him on 09/02/2016 and he has come right back with redness swelling and some open ulcers on his left lower extremity. He says this was caused by trying to apply his compression stockings and he's been unable to use this and has not been able to use his lymphedema pumps. He had some doxycycline leftover and he has started on this a few days ago. 09/16/16; there are no open wounds on his leg on the left and no evidence of cellulitis. 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 He does not complain of symptoms here. I am not clear use using his external compression pumps. 09/23/16; I have not seen this patient in 2 weeks. He canceled his appointment 10 days ago as he was going on vacation. He tells me that on Monday he noticed a large area on his posterior left leg which is been draining copiously and is reopened into a large wound. He is been using ABDs and the external part of his juxtalite, according to our nurse this was not on properly. 10/07/16; Still a substantial area on the posterior left leg. Using silver alginate 10/14/16; in general better although there is still open area which looks healthy. Still using silver alginate. He reminds me that this happen before he left for [facility]. Today while he was showering in the morning. He had been using his juxtalite's 10/21/16; the area on his  posterior left leg is fully epithelialized. However he arrives today with a large area of  tender erythema in his medial and posterior left thigh just above the knee. I have marked the area. Once again he is reluctant to consider hospitalization. I treated him with oral antibiotics in the past for a similar situation with resolution I think with doxycycline however this area it seems more extensive to me. He is not complaining of fever but does have chills and says states he is thirsty. His blood sugar today was in the 140s at home 10/25/16 the area on his posterior left leg is fully epithelialized although there is still some weeping edema. The large area of tenderness and erythema in his medial and posterior left thigh is a lot less tender although there is still a lot of swelling in this thigh. He states he feels a lot better. He is on doxycycline and Augmentin that I started last week. This will continued until Tuesday, December 26. I have ordered a duplex ultrasound of the left thigh rule out DVT whether there is an abscess something that would need to be drained I would also like to know. 11/01/16; he still has weeping edema from a not fully epithelialized area on his left posterior calf. Most of the rest of this looks a lot better. He has completed his antibiotics. His thigh is a lot better. Duplex ultrasound did not show a DVT in the thigh 11/08/16; he comes in today with more Denuded surface epithelium from the posterior aspect of his calf. There is no real evidence of cellulitis. The superior aspect of his wrap appears to have put quite an indentation in his leg just below the knee and this may have contributed. He does not complain of pain or fever. We have been using silver alginate as the primary dressing. The area of cellulitis in the right thigh has totally resolved. He has been using his compression stockings once a week 11/15/16; the patient arrives today with more loss of epithelium from the posterior aspect of his left calf. He now has a fairly substantial wound in this  area. The reason behind this deterioration isn't exactly clear although his edema is not well controlled. He states he feels he is generally more swollen systemically. He is not complaining of chest pain shortness of breath fever. Tells me he has an appointment with his primary physician in early February. He is on 80 mg of oral Lasix a day. He claims compliance with the external compression pumps. He is not having any pain in his legs similar to what he has with his recurrent cellulitis 11/22/16; the patient arrives a follow-up of his large area on his left lateral calf. This looks somewhat better today. He came in earlier in the week for a dressing change since I saw him a week ago. He is not complaining of any pain no shortness of breath no chest pain 11/28/16; the patient arrives for follow-up of his large area on the left lateral calf this does not look better. In fact it is larger weeping edema. The surface of the wound does not look too bad. We have been using silver alginate although I'm not certain that this is a dressing issue. 12/05/16; again the patient follows up for a large wound on the left lateral and left posterior calf this does not look better. There continues to be weeping edema necrotic surface tissue. More worrisome than this once again there is erythema below the wound involving  the distal Achilles and heel suggestive of cellulitis. He is on his feet working most of the day of this is not going well. We are changing his dressing twice a week to facilitate the drainage. 12/12/16; not much change in the overall dimensions of the large area on the left posterior calf. This is very inflamed looking. I gave him an. Doxycycline last week does not really seem to have helped. He found the wrap very painful indeed it seems to of dog into his legs superiorly and perhaps around the heel. He came in early today because the drainage had soaked through his dressings. 12/19/16- patient arrives  for follow-up evaluation of his left lower extremity ulcers. He states that he is using his lymphedema pumps once daily when there is "no drainage". He admits to not using his lipedema pumps while under current treatment. His blood sugars have been consistently between 150-200. 12/26/16; the patient is not using his compression pumps at home because of the wetness on his feet. I've advised him that I think it's important for him to use this daily. He finds his feet too wet, he can put a plastic bag over his legs while he is in the pumps. Otherwise I think will be in a vicious circle. We are using silver alginate to the major area on his left posterior calf 01/02/17; the patient's posterior left leg has further of all into 3 open wounds. All of them covered with a necrotic surface. He claims to be using his compression pumps once a day. His edema control is marginal. Continue with silver alginate 01/10/17; the patient's left posterior leg actually looks somewhat better. There is less edema, less erythema. Still has 3 open areas covered with a necrotic surface requiring debridement. He claims to be using his compression pumps once a day his edema control is better 01/17/17; the patient's left posterior calf look better last week when I saw him and his wrap was changed 2 days ago. He has noted increasing pain in the left heel and arrives today with much larger wounds extensive erythema extending down into the entire heel area especially tender medially. He is not systemically unwell CBGs have been controlled no fever. Our intake nurse showed me limegreen drainage on his AVD pads. 01/24/17; his usual this patient responds nicely to antibiotics last week giving him Levaquin for presumed Pseudomonas. The whole entire posterior part of his leg is much better much less inflamed and in the case of his Achilles heel area much less tender. He has also had some epithelialization posteriorly there are still open  areas here and still draining but overall considerably better 01/31/17- He has continue to tolerate the compression wraps. he states that he continues to use the lymphedema pumps daily, and can increase to twice daily on the weekends. He is voicing no complaints or concerns regarding his LLE ulcers 02/07/17-he is here for follow-up evaluation. He states that he noted some erythema to the left medial and anterior thigh, which he states is new as of yesterday. He is concerned about recurrent cellulitis. He states his blood sugars have been slightly elevated, this morning in the 180s 02/14/17; he is here for follow-up evaluation. When he was last here there was erythema superiorly from his posterior wound in his anterior thigh. He was prescribed Levaquin however a culture of the wound surface grew MRSA over the phone I changed him to doxycycline on Monday and things seem to be a lot better. 02/24/17; patient missed his  appointment on Friday therefore we changed his nurse visit into a physician visit today. Still using silver alginate on the large area of the posterior left thigh. He isn't new area on the dorsal left second toe 03/03/17; actually better today although he admits he has not used his external compression pumps in the last 2 days or so because of work responsibilities over the weekend. 03/10/17; continued improvement. External compression pumps once a day almost all of his wounds have closed on the posterior left calf. Better edema control 03/17/17; in general improved. He still has 3 small open areas on the lateral aspect of his left leg however most of the area on the posterior part of his leg is epithelialized. He has better edema control. He has an ABD pad under his stocking on the right anterior lower leg although he did not let us look at that today. 03/24/17; patient arrives back in clinic today with no open areas however there are areas on the posterior left calf and anterior left calf  that are less than 100% epithelialized. His edema is well controlled in the left lower leg. There is some pitting edema probably lymphedema in the left upper thigh. He uses compression pumps at home once per day. I tried to get him to do this twice a day although he is very reticent. 04/01/2017 -- for the last 2 days he's had significant redness, tenderness and weeping and came in for an urgent visit today. 04/07/17; patient still has 6 more days of doxycycline. He was seen by Dr. Meyer Russel last Wednesday for cellulitis involving the posterior aspect, lateral aspect of his Involving his heel. For the most part he is better there is less erythema and less weeping. He has been on his feet for 12 hours o2 over the weekend. Using his compression pumps once a day 04/14/17 arrives today with continued improvement. Only one area on the posterior left calf that is not fully epithelialized. He has intense bilateral venous inflammation associated with his chronic venous insufficiency disease and secondary lymphedema. We have been using silver alginate to the left posterior calf wound In passing he tells [place] today that the right leg but we have not seen in quite some time has an open area on it but he doesn't want [place] to look at this today states he will show this to [place] next week. 04/21/17; there is no open area on his left leg although he still reports some weeping edema. He showed [place] his right leg today which is the first time we've seen this leg in a long time. He has a large area of open wound on the right leg anteriorly healthy granulation. Quite a bit of swelling in the right leg and some degree of venous inflammation. He told us about the right leg in passing last week but states that deterioration in the right leg really only happened over the weekend 04/28/17; there is no open area on the left leg although there is an irritated part on the posterior which is like a wrap injury. The wound on the right leg  which was new from last week at least to [place] is a lot better. 05/05/17; still no open area on the left leg. Patient is using his new compression stocking which seems to be doing a good job of controlling the edema. He states he is using his compression pumps once per day. The right leg still has an open wound although it is better in terms of surface  area. Required debridement. A lot of pain in the posterior right Achilles marked tenderness. Usually this type of presentation this patient gives concern for an active cellulitis 05/12/17; patient arrives today with his major wound from last week on the right lateral leg somewhat better. Still requiring debridement. He was using his compression stocking on the left leg however that is reopened with superficial wounds anteriorly he did not have an open wound on this leg previously. He is still using his juxta light's once daily at night. He cannot find the time to do this in the morning as he has to be at work by 7 AM 05/19/17; right lateral leg wound looks improved. No debridement required. The concerning area is on the left posterior leg which appears to almost have a subcutaneous hemorrhagic component to it. We've been using silver alginate to all the wounds 05/26/17; the right lateral leg wound continues to look improved. However the area on the left posterior calf is a tightly adherent surface. [place] using silver alginate. Because of the weeping edema in his legs there is very little good alternatives. 06/02/17; the patient left here last week looking quite good. Major wound on the left posterior calf and a small one on the right lateral calf. Both of these look satisfactory. He tells me that by Wednesday he had noted increased pain in the left leg and drainage. He called on Thursday and Friday to get an appointment here but we were blocked. He did not go to urgent care or his primary physician. He thinks he had a fever on Thursday but did not actually  take his temperature. He has not been using his compression pumps on the left leg because of pain. I advised him to go to the emergency room today for IV antibiotics for stents of left leg cellulitis but he has refused I have asked him to take 2 days off work to keep his leg elevated and he has refused this as well. In view of this I'm going to call him and Augmentin and doxycycline. 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 However after a detailed discussion regarding the risk benefits and alternatives he refuses to get admitted to the hospital. With no other recourse I will continue him on oral antibiotics as before and hopefully he'll have his infectious disease consultation this week. 06/16/2017 -- the patient was seen today by the nurse practitioner at infectious disease Ms. Dixon. 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 He has also been advise Lotrimin cream locally. she also had wise IV antibiotics for his condition if it flares up 06/23/17; patient arrives today with drainage bilaterally although the remaining wound on the left posterior calf after cleaning up today "highlighter yellow drainage" did not look too bad. Unfortunately he has had breakdown on the right anterior leg [previously this leg had not been open and he is using a black stocking] he went to see infectious disease and is been put on clindamycin 150 mg daily, I did not verify the dose although I'm not familiar with using  clindamycin in this dosing range, perhaps for prophylaxisoo 06/27/17; I brought  this patient back today to follow-up on the wound deterioration on the right lower leg together with surrounding cellulitis. I started him on doxycycline 4 days ago. This area looks better however he comes in today with intense cellulitis on the medial part of his left thigh. This is not have a wound in this area. Extremely tender. We've been using silver alginate to the wounds on the right lower leg left lower leg with bilateral 4 layer compression he is using his external compression pumps once a day 07/04/17; patient's left medial thigh cellulitis looks better. He has not been using his compression pumps as his insert said it was contraindicated with cellulitis. His right leg continues to make improvements all the wounds are still open. We only have one remaining wound on the left posterior calf. Using silver alginate to all open areas. He is on doxycycline which I started a week ago and should be finishing I gave him Augmentin after Thursday's visit for the severe cellulitis on the left medial thigh which fortunately looks better 07/14/17; the patient's left medial thigh cellulitis has resolved. The cellulitis in his right lower calf on the right also looks better. All of his wounds are stable to improved we've been using silver alginate he has completed the antibiotics I have given him. He has clindamycin 150 mg once a day prescribed by infectious disease for prophylaxis, I've advised him to start this now. We have been using bilateral Unna boots over silver alginate to the wound areas 07/21/17; the patient is been to see infectious disease who noted his recurrent problems with cellulitis. He was not able to tolerate prophylactic clindamycin therefore he is on amoxicillin 500 twice a day. He also had a second daily dose of Lasix added By Dr. Oneta RackMcKeown but he is not taking this. Nor is he being completely compliant with his compression pumps a especially not this week. He has 2 remaining wounds one  on the right posterior lateral lower leg and one on the left posterior medial lower leg. 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 The patient has Unna boots bilaterally. 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 He has bilateral Unna boots for compression and silver alginate to his wounds. Arrives today with his legs looking as good as I have seen him in quite some time. Not surprisingly his wounds look better as well with improvement on the right lateral leg venous insufficiency wound and also the left medial leg. He is still using the compression pumps once a day 08/11/17; both legs appear to be doing better wounds on the right lateral and left medial legs look better. Skin on the right leg quite good. He is been using silver alginate as the primary dressing. I'm going to use Anasept gel calcium alginate and maintain all the secondary dressings 08/18/17; the patient continues to actually do quite well. The area on his right lateral leg is just about closed the left medial also looks better although it is still moist in this area. His edema is well controlled we have been using Anasept gel with calcium alginate and the usual secondary dressings, 4 layer compression and once daily use of his compression pumps "always been able to manage  09/01/17; the patient continues to do reasonably well in spite of his trip to [place]. The area on the right lateral leg is epithelialized. Left is much better but still open. He has more edema and more chronic erythema on the left leg [venous inflammation] 09/08/17; he arrives today with no open wound on the right lateral leg and decently controlled edema. Unfortunately his left leg is not nearly as in his good situation as last week.he  apparently had increasing edema starting on Saturday. He edema soaked through into his foot so used a plastic bag to walk around his home. The area on the medial right leg which was his open area is about the same however he has lost surface epithelium on the left lateral which is new and he has significant pain in the Achilles area of the left foot. He is already on amoxicillin chronically for prophylaxis of cellulitis in the left leg 09/15/17; he is completed a week of doxycycline and the cellulitis in the left posterior leg and Achilles area is as usual improved. He still has a lot of edema and fluid soaking through his dressings. There is no open wound on the right leg. He saw infectious disease NP today 09/22/17;As usual 1 we transition him from our compression wraps to his stockings things did not go well. He has several small open areas on the right leg. He states this was caused by the compression wrap on his skin although he did not wear this with the stockings over them. He has several superficial areas on the left leg medially laterally posteriorly. He does not have any evidence of active cellulitis especially involving the left Achilles The patient is traveling from [facility] Saturday going to [facility]. He states he isn't attempting to get an appointment with a heel objects wound center there to change his dressings. I am not completely certain whether this will work 10/06/17; the patient came in on Friday for a nurse visit and the nurse reported that his legs actually look quite good. He arrives in clinic today for his regular follow-up visit. He has a new wound on his left third toe over the PIP probably caused by friction with his footwear. He has small areas on the left leg and a very superficial but epithelialized area on the right anterior lateral lower leg. Other than that his legs look as good as I've seen him in quite some time. We have been using silver alginate Review  of systems; no chest pain no shortness of breath other than this a 10 point review of systems negative 10/20/17; seen by Dr. Meyer Russel last week. He had taken some antibiotics [doxycycline] that he had left over. Dr. Meyer Russel thought he had candida infection and declined to give him further antibiotics. He has a small wound remaining on the right lateral leg several areas on the left leg including a larger area on the left posterior several left medial and anterior and a small wound on the left lateral. The area on the left dorsal third toe looks a lot better. ROS; Gen.; no fever, respiratory no cough no sputum Cardiac no chest pain other than this 10 point review of system is negative 10/30/17; patient arrives today having fallen in the bathtub 3 days ago. It took him a while to get up. He has pain and maceration in the wounds on his left leg which have deteriorated. He has not been using his pumps he also has some maceration on the right lateral leg.  11/03/17; patient continues to have weeping edema especially in the left leg. This saturates his dressings which were just put on on 12/27. As usual the doxycycline seems to take care of the cellulitis on his lower leg. He is not complaining of fever, chills, or other systemic symptoms. He states his leg feels a lot better on the doxycycline I gave him empirically. He also apparently gets injections at his primary doctor's officeo Rocephin for cellulitis prophylaxis. I didn't ask him about his compression pump compliance today I think that's probably marginal. Arrives in the clinic with all of his dressings primary and secondary macerated full of fluid and he has bilateral edema 11/10/17; the patient's right leg looks some better although there is still a cluster of wounds on the right lateral. The left leg is inflamed with almost circumferential skin loss medially to laterally although we are still maintaining anteriorly. He does not have overt cellulitis  there is a lot of drainage. He is not using compression pumps. We have been using silver alginate to the wound areas, there are not a lot of options here 11/17/17; the patient's right leg continues to be stable although there is still open wounds, better than last week. The inflammation in the left leg is better. Still loss of surface layer epithelium especially posteriorly. There is no overt cellulitis in the amount of edema and his left leg is really quite good, tells me he is using his compression pumps once a day. 11/24/17; patient's right leg has a small superficial wound laterally this continues to improve. The inflammation in the left leg is still improving however we have continuous surface layer epithelial loss posteriorly. There is no overt cellulitis in the amount of edema in both legs is really quite good. He states he is using his compression pumps on the left leg once a day for 5 out of 7 days 12/01/17; very small superficial areas on the right lateral leg continue to improve. Edema control in both legs is better today. He has continued loss of surface epithelialization and left posterior calf although I think this is better. We have been using silver alginate with large number of absorptive secondary dressings 4 layer on the left Unna boot on the right at his request. He tells me he is using his compression pumps once a day 12/08/17; he has no open area on the right leg is edema control is good here. ooOn the left leg however he has marked erythema and tenderness breakdown of skin. He has what appears to be a wrap injury just distal to the popliteal fossa. 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 There was  swelling erythema and breakdown in the right leg. He has much worse skin breakdown in the left leg as well multiple open areas medially and posteriorly as well as laterally. He tells me he has been using his compression pumps but tells me he feels that the drainage out of his leg is worse when he uses a compression pumps. To be fair to him he is been saying this for a while however I don't know that I have really been listening to this. I wonder if the compression pumps are working properly 12/22/17;. Once again he  arrives with severe erythema, weeping edema from the left greater than right leg. Noncompliance with compression pumps. New this visit he is complaining of pain on the lateral aspect of the right leg and the medial aspect of his right thigh. He apparently saw his cardiologist Dr. [name] who was ordered an echocardiogram area and I think this is a step in the right direction 12/25/17; started his doxycycline Monday night. There is still intense erythema of the right leg especially in the anterior thigh although there is less tenderness. The erythema around the wound on the right lateral calf also is less tender. He still complaining of pain in the left heel. His wounds are about the same right lateral left medial left lateral. Superficial but certainly not close to closure. He denies being systemically unwell no fever chills no abdominal pain no diarrhea 12/29/17; back in follow-up of his extensive right calf and right thigh cellulitis. I added amoxicillin to cover possible doxycycline resistant strep. This seems to of done the trick he is in much less pain there is much less erythema and swelling. He has his echocardiogram at 11:00 this morning. X-ray of the left heel was also negative. 01/05/18; the patient arrived with his edema under much better control. Now that he is retired he is able to use his compression pumps daily and sometimes twice a day per the patient. He has a wound on the  right leg the lateral wound looks better. Area on the left leg also looks a lot better. He has no evidence of cellulitis in his bilateral thighs I had a quick peak at his echocardiogram. He is in normal ejection fraction and normal left ventricular function. He has moderate pulmonary hypertension moderately reduced right ventricular function. One would have to wonder about chronic sleep apnea although he says he doesn't snore. He'll review the echocardiogram with his cardiologist. 01/12/18; the patient arrives with the edema in both legs under exemplary control. He is using his compression pumps daily and sometimes twice daily. His wound on the right lateral leg is just about closed. He still has some weeping areas on the posterior left calf and lateral left calf although everything is just about closed here as well. I have spoken with Aldean Baker who is the patient's nurse practitioner and infectious disease. She was concerned that the patient had not understood that the parenteral penicillin injections he was receiving for cellulitis prophylaxis was actually benefiting him. I don't think the patient actually saw that I would tend to agree we were certainly dealing with less infections although he had a serious one last month. 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 He is healed. He'll be placed in juxtalite compression wraps and increase his lymphedema pumps to twice daily. We will follow up again next week to ensure there are no issues with the new regiment. 01/20/18-he is here for evaluation of bilateral lower extremity weeping edema. Yesterday he was placed in compression wrap to the right lower extremity and compression stocking to left lower shrubbery. He states he uses lymphedema pumps last night and again this morning and noted a blister to the left lower extremity. On exam he was noted to have drainage to the right lower extremity. He will be  placed in Unna boots bilaterally and follow-up next week 01/26/18; patient was actually discharged a week ago to his own juxta light stockings only to return the next day with bilateral lower extremity weeping edema.he was placed in  bilateral Unna boots. He arrives today with pain in the back of his left leg. There is no open area on the right leg however there is a linear/wrap injury on the left leg and weeping edema on the left leg posteriorly. I spoke with infectious disease about 10 days ago. They were disappointed that the patient elected to discontinue prophylactic intramuscular penicillin shots as they felt it was particularly beneficial in reducing the frequency of his cellulitis. I discussed this with the patient today. He does not share this view. He'll definitely need antibiotics today. Finally he is traveling to [place] and trauma leaving this Saturday and returning a week later and he does not travel with his pumps. He is going by car 01/30/18; patient was seen 4 days ago and brought back in today for review of cellulitis in the left leg posteriorly. I put him on amoxicillin this really hasn't helped as much as I might like. He is also worried because he is traveling to [facility] trauma by car. Finally we will be rewrapping him. There is no open area on the right leg over his left leg has multiple weeping areas as usual 02/09/18; The same wrap on for 10 days. He did not pick up the last doxycycline I prescribed for him. He apparently took 4 days worth he already had. There is nothing open on his right leg and the edema control is really quite good. He's had damage in the left leg medially and laterally especially probably related to the prolonged use of Unna boots 02/12/18; the patient arrived in clinic today for a nurse visit/wrap change. He complained of a lot of pain in the left posterior calf. He is taking doxycycline that I previously prescribed for him. Unfortunately even though he  used his stockings and apparently used to compression pumps twice a day he has weeping edema coming out of the lateral part of his right leg. This is coming from the lower anterior lateral skin area. 02/16/18; the patient has finished his doxycycline and will finish the amoxicillin 2 days. The area of cellulitis in the left calf posteriorly has resolved. He is no longer having any pain. He tells me he is using his compression pumps at least once a day sometimes twice. 02/23/18; the patient finished his doxycycline and Amoxil last week. On Friday he noticed a small erythematous circle about the size of a quarter on the left lower leg just above his ankle. This rapidly expanded and he now has erythema on the lateral and posterior part of the thigh. This is bright red. Also has an area on the dorsal foot just above his toes and a tender area just below the left popliteal fossa. He came off his prophylactic penicillin injections at his own insistence one or 2 months ago. This is obviously deteriorated since then 03/02/18; patient is on doxycycline and Amoxil. Culture I did last week of the weeping area on the back of his left calf grew group B strep. I have therefore renewed the amoxicillin 500 3 times a day for a further week. He has not been systemically unwell. Still complaining of an area of discomfort right under his left popliteal fossa. There is no open wound on the right leg. He tells me that he is using his pumps twice a day on most days 03/09/18; patient arrives in clinic today completing his amoxicillin today. The cellulitis on his left leg is better. Furthermore he tells me that he had intramuscular penicillin shots that his  primary care office today. However he also states that the wrap on his right leg fell down shortly after leaving clinic last week. He developed a large blister that was present when he came in for a nurse visit later in the week and then he developed intense  discomfort around this area.He tells me he is using his compression pumps 03/16/18; the patient has completed his doxycycline. The infectious part of this/cellulitis in the left heel area left popliteal area is a lot better. He has 2 open areas on the right calf. Still areas on the left calf but this is a lot better as well. 03/24/18; the patient arrives complaining of pain in the left popliteal area again. He thinks some of this is wrap injury. He has no open area on the right leg and really no open area on the left calf either except for the popliteal area. He claims to be compliant with the compression pumps 03/31/18; I gave him doxycycline last week because of cellulitis in the left popliteal area. This is a lot better although the surface epithelium is denuded off and response to this. He arrives today with uncontrolled edema in the right calf area as well as a fingernail injury in the right lateral calf. There is only a few open areas on the left 04/06/18; I gave him amoxicillin doxycycline over the last 2 weeks that the amoxicillin should be completing currently. He is not complaining of any pain or systemic symptoms. The only open areas see has is on the right lateral lower leg paradoxically I cannot see anything on the left lower leg. He tells me he is using his compression pumps twice a day on most days. Silver alginate to the wounds that are open under 4 layer compression 04/13/18; he completed antibiotics and has no new complaints. Using his compression pumps. Silver alginate that anything that's opened 04/20/18; he is using his compression pumps religiously. Silver alginate 4 layer compression anything that's opened. He comes in today with no open wounds on the left leg but 3 on the right including a new one posteriorly. He has 2 on the right lateral and one on the right posterior. He likes Unna boots on the right leg for reasons that aren't really clear we had the usual 4 layer compression  on the left. It may be necessary to move to the 4 layer compression on the right however for now I left them in the Unna boots 04/27/18; he is using his compression pumps at least once a day. He has still the wounds on the right lateral calf. The area right posteriorly has closed. He does not have an open wound on the left under 4 layer compression however on the dorsal left foot just proximal to the toes and the left third toe 2 small open areas were identified 05/11/18; he has not uses compression pumps. The areas on the right lateral calf have coalesced into one large wound necrotic surface. On the left side he has one small wound anteriorly however the edema is now weeping out of a large part of his left leg. He says he wasn't using his pumps because of the weeping fluid. I explained to him that this is the time he needs to pump more 05/18/18; patient states he is using his compression pumps twice a day. The area on the right lateral large wound albeit superficial. On the left side he has innumerable number of small new wounds on the left calf particularly laterally but  several anteriorly and medially. All these appear to have healthy granulated base these look like the remnants of blisters however they occurred under compression. The patient arrives in clinic today with his legs somewhat better. There is certainly less edema, less multiple open areas on the left calf and the right anterior leg looks somewhat better as well superficial and a little smaller. However he relates pain and erythema over the last 3-4 days in the thigh and I looked at this today. He has not been systemically unwell no fever no chills no change in blood sugar values 05/25/18; comes in today in a better state. The severe cellulitis on his left leg seems better with the Keflex. Not as tender. He has not been systemically unwell ooHard to find an open wound on the left lower leg using his compression pumps twice a  day ooThe confluent wounds on his right lateral calf somewhat better looking. These will ultimately need debridement I didn't do this today. 06/01/18; the severe cellulitis on the left anterior thigh has resolved and he is completed his Keflex. ooThere is no open wound on the left leg however there is a superficial excoriation at the base of the third toe dorsally. Skin on the bottom of his left foot is macerated looking. ooThe left the wounds on the lateral right leg actually looks some better although he did require debridement of the top half of this wound area with an open curet 06/09/18 on evaluation today patient appears to be doing poorly in regard to his right lower extremity in particular this appears to likely be infected he has very thick purulent discharge along with a bright green tent to the discharge. This makes me concerned about the possibility of pseudomonas. He's also having increased discomfort at this point on evaluation. Fortunately there does not appear to be any evidence of infection spreading to the other location at this time. 06/16/18 on evaluation today patient appears to actually be doing fairly well. His ulcer has actually diminished in size quite significantly at this point which is good news. Nonetheless he still does have some evidence of infection he did see infectious disease this morning before coming here for his appointment. I did review the results of their evaluation and their note today. They did actually have him discontinue the Cipro and initiate treatment with linezolid at this time. He is doing this for the next seven days and they recommended a follow-up in four months with them. He is the keep a log of the need for intermittent antibiotic therapy between now and when he falls back up with infectious disease. This will help them gaze what exactly they need to do to try and help them out. 06/23/18; the patient arrives today with no open wounds on the  left leg and left third toe healed. He is been using his compression pumps twice a day. On the right lateral leg he still has a sizable wound but this is a lot better than last time I saw this. 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 Has been using silver alginate under 4 layer compression 06/30/18; the only open wound he has is on the right lateral leg and this looks healthy. No debridement is required. We have been using silver alginate. He does not have an open wound on the left leg. There is apparently some drainage from the dorsal proximal third toe  on the left although I see no open wound here. 07/03/18 on evaluation today patient was actually here just for a nurse visit rapid change. However when he was here on Wednesday for his rat change due to having been healed on the left and then developing blisters we initiated the wrap again knowing that he would be back today for [place] to reevaluate and see were at. Unfortunately he has developed some cellulitis into the proximal portion of his right lower extremity even into the region of his thigh. 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 He was placed on one as what at that point. Nonetheless he is done with that and has been tolerating it well otherwise. Doxycycline which in the past really did not seem to be effective for him. Nonetheless I think the best option may be for [place] to definitely reinitiate the antibiotics for a longer period of time. 07/07/18; since I last saw this patient a week ago he has had a difficult time. At that point he did not have an open wound on his left leg. We transitioned him into juxta light stockings. He was apparently in the clinic the next day with blisters on the left lateral and left medial lower calf. He also had weeping edema fluid. He was put back into  a compression wrap. He was also in the clinic on Friday with intense erythema in his right thigh. Per the patient he was started on Bactrim however that didn't work at all in terms of relieving his pain and swelling. He has taken 3 doxycycline that he had left over from last time and that seems to of helped. He has blistering on the right thigh as well. 07/14/18; the erythema on his right thigh has gotten better with doxycycline that he is finishing. 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 Presumably this cellulitis in the thigh was not related to that although I think this is a bit concerning going forward. He still has an area on the right lateral calf the blister on the right medial calf just below the knee that was discussed above. On the left 2 small open areas left medial and left lateral. Edema control is adequate. He is using his compression pumps twice a day 07/20/18; continued improvement in the condition of both legs especially the edema in his bilateral thighs. He tells me he is been losing weight through a combination of diet and exercise. He is using his compression pumps twice a day. So overall she made to the remaining wounds 07/27/2018; continued improvement in condition of both legs. His edema is well controlled. The area on the right lateral leg is just about closed he had one blisters show up on the medial left upper calf. We have him in 4 layer compression. He is going on a 10-day trip to [place], [place] and [place]. He will be driving. He wants to wear Unna boots because of the lessening amount of constriction. He will not use compression pumps while he is away 08/05/18 on evaluation today patient actually appears to be doing decently well all things considered in regard to his bilateral lower extremities. The worst ulcer is actually only posterior aspect of his left lower extremity with a  four layer compression wrap cut into his leg a couple weeks back. He did have a trip and actually had [place]  Boot wraps for the trip that he is worn since he was last here. Nonetheless he feels like the [employer] wraps actually do better for him his swelling is up a little bit but he also with his trip was not taking his Lasix on a regular set schedule like he was supposed to be. He states that obviously the reason being that he cannot drive and keep going without having to urinate too frequently which makes it difficult. He did not have his pumps with him while he was away either which I think also maybe playing a role here too. 08/13/2018; the patient only has a small open wound on the right lateral calf which is a big improvement in the last month or 2. He also has the area posteriorly just below the posterior fossa on the left which I think was a wrap injury from several weeks ago. He has no current evidence of cellulitis. He tells me he is back into his compression pumps twice a day. He also tells me that while he was at the laundromat somebody stole a section of his extremitease stockings 08/20/2018; back in the clinic with a much improved state. He only has small areas on the right lateral mid calf which is just about healed. This was is more substantial area for quite a prolonged period of time. He has a small open area on the left anterior tibia. The area on the posterior calf just below the popliteal fossa is closed today. He is using his compression pumps twice a day 08/28/2018; patient has no open wound on the right leg. He has a smattering of open areas on the calf with some weeping lymphedema. More problematically than that it looks as though his wraps of slipped down in his usual he has very angry upper area of edema just below the right medial knee and on the right lateral calf. He has no open area on his feet. [place] I will send him in  an antibiotic. We will continue to wrap the right leg. We ordered extremitease stockings for him last week and I plan to transition the right leg to a stocking when he gets home which will be in 10 days time. As usual he is very reluctant to take his pumps with him when he travels 09/07/2018; [place] [facility] [facility] [place] He shows me a picture of his left leg in the mid part of his trip last week with intense fire engine erythema. The picture look bad enough I would have considered sending him to the hospital. Instead he went [facility] [place] [facility] They did not prescribe him antibiotics but he did take some doxycycline he had leftover from a previous visit. I had given him trimethoprim sulfamethoxazole before he left this did not work according to the patient. This is resulted in some improvement fortunately. He comes back with a large wound on the left posterior calf. Smaller area on the left anterior tibia. Denuded blisters on the dorsal left foot over his toes. Does not have much in the way of wounds on the right leg although he does have a very tender area on the right posterior area just below the popliteal fossa also suggestive of infection. He promises me he is back on his pumps twice a day 09/15/2018; the intense cellulitis in his left lower calf is a lot better. The wound area on the posterior left calf is also  so better. However he has reasonably extensive wounds on the dorsal aspect of his second and third toes and the proximal foot just at the base of the toes. There is nothing open on the right leg 09/22/2018; the patient has excellent edema control in his legs bilaterally. He is using his external compression pumps twice a day. He has no open area on the right leg and only the areas in the left foot dorsally second and third toe area on the left side. He does not have any signs of active cellulitis. 10/06/2018; the patient has good edema control  bilaterally. He has no open wound on the right leg. There is a blister in the posterior aspect of his left calf that we had to deal with today. He is using his compression pumps twice a day. There is no signs of active cellulitis. We have been using silver alginate to the wound areas. He still has vulnerable areas on the base of his left first second toes dorsally He has a his extremities stockings and we are going to transition him today into the stocking on the right leg. He is cautioned that he will need to continue to use the compression pumps twice a day. If he notices uncontrolled edema in the right leg he may need to go to 3 times a day. 10/13/2018; the patient came in for a nurse check on Friday he has a large flaccid blister on the right medial calf just below the knee. We unroofed this. He has this and a new area underneath the posterior mid calf which was undoubtedly a blister as well. He also has several small areas on the right which is the area we put his extremities stocking on. 10/19/2018; the patient went to see infectious disease this morning I am not sure if that was a routine follow-up in any case the doxycycline I had given him was discontinued and started on linezolid. He has not started this. It is easy to look at his left calf and the inflammation and think this is cellulitis however he is very tender in the tissue just below the popliteal fossa and I have no doubt that there is infection going on here. He states the problem he is having is that with the compression pumps the edema goes down and then starts walking the wrap falls down. We will see if we can adhere this. He has 1 or 2 minuscule open areas on the right still areas that are weeping on the posterior left calf, the base of his left second and third toes 10/26/18; back today in clinic with quite of skin breakdown in his left anterior leg. This may have been infection the area below the popliteal fossa seems a lot  better however tremendous epithelial loss on the left anterior mid tibia area over quite inexpensive tissue. He has 2 blisters on the right side but no other open wound here. 10/29/2018; came in urgently to see [place] today and we worked him in for review. He states that the 4 layer compression on the right leg caused pain he had to cut it down to roughly his mid calf this caused swelling above the wrap and he has blisters and skin breakdown today. As a result of the pain he has not been using his pumps. Both legs are a lot more edematous and there is a lot of weeping fluid. 11/02/18; arrives in clinic with continued difficulties in the right leg> left. Leg is swollen and painful. multiple  skin blisters and new open areas especially laterally. He has not been using his pumps on the right leg. 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 He is not systemically unwell. 11/09/2018; the patient claims he is being compliant with his pumps. He is finished the doxycycline I gave him last week. Culture I did of the wound on the right lateral leg showed a few very resistant methicillin staph aureus. This was resistant to doxycycline. Nevertheless he states the pain in the leg is a lot better which makes me wonder if the cultured organism was not really what was causing the problem nevertheless this is a very dangerous organism to be culturing out of any wound. His right leg is still a lot larger than the left. He is using an Radio broadcast assistant on this area, he blames a 4-layer compression for causing the original skin breakdown which I doubt is true however I cannot talk him out of it. We have been using silver alginate to all of these areas which were initially blisters 11/16/2018; patient is being compliant with his external compression pumps at twice a day. Miraculously he arrives in clinic today with absolutely no open wounds. He has better edema control on the left where he  has been using 4 layer compression versus wound of wounds on the right and I pointed this out to him. There is no inflammation in the skin in his lower legs which is also somewhat unusual for him. There is no open wounds on the dorsal left foot. He has extremitease stockings at home and I have asked him to bring these in next week. 11/25/18 patient's lower extremity on examination today on the left appears for the most part to be wound free. He does have an open wound on the lateral aspect of the right lower extremity but this is minimal compared to what I've seen in past. He does request that we go ahead and wrap the left leg as well even though there's nothing open just so hopefully it will not reopen in short order. 1/28; patient has superficial open wounds on the right lateral calf left anterior calf and left posterior calf. His edema control is adequate. 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 We have been using silver alginate as the primary dressing. He claims compliance with his compression pumps 2/4; patient has superficial open wounds on numerous areas of his left calf and again one on the left dorsal foot. The areas on the right lateral calf have healed. The cellulitis that I gave him doxycycline for last week is also resolved this was mostly on the left anterior calf just below the tibial tuberosity. His edema looks fairly well-controlled. He tells me he went to see his primary doctor today and had blood work ordered 2/11; once again he has several open areas on the left calf left tibial area. Most of these are small and appear to have healthy granulation. He does not have anything open on the right. The edema and control in his thighs is pretty good which is usually a good indication he has been using his pumps as requested. 2/18; he continues to have several small areas on the left calf and left  tibial area. Most of these are small healthy granulation. We put him in his stocking on the right leg last week and he arrives with a superficial open  area over the right upper tibia and a fairly large area on the right lateral tibia in similar condition. His edema control actually does not look too bad, he claims to be using his compression pumps twice a day 2/25. Continued small areas on the left calf and left tibial area. New areas especially on the right are identified just below the tibial tuberosity and on the right upper tibia itself. There are also areas of weeping edema fluid even without an obvious wound. He does not have a considerable degree of lymphedema but clearly there is more edema here than his skin can handle. He states he is using the pumps twice a day. We have an Unna boot on the right and 4 layer compression on the left. 3/3; he continues to have an area on the right lateral calf and right posterior calf just below the popliteal fossa. There is a fair amount of tenderness around the wound on the popliteal fossa but I did not see any evidence of cellulitis, could just be that the wrap came down and rubbed in this area. ooHe does not have an open area on the left leg however there is an area on the left dorsal foot at the base of the third toe ooWe have been using silver alginate to all wound areas 3/10; he did not have an open area on his left leg last time he was here a week ago. Today he arrives with a horizontal wound just below the tibial tuberosity and an area on the left lateral calf. He has intense erythema and tenderness in this area. The area is on the right lateral calf and right posterior calf better than last week. We have been using silver alginate as usual 3/18 - Patient returns with 3 small open areas on left calf, and 1 small open area on right calf, the skin looks ok with no significant erythema, he continues the UNA boot on right and 4 layer compression on  left. The right lateral calf wound is closed , the right posterior is small area. we will continue silver alginate to the areas. 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 He is been tolerating the dressing changes without complication. Fortunately she has made excellent improvement in regard to the overall status of his wounds. Unfortunately every time we cease wrapping him he ends up reopening in causing more significant issues at that point. Again I'm unsure of the best direction to take although I think the lymphedema clinic may be appropriate for him. 02/03/19 on evaluation today patient appears to be doing well in regard to the wounds that we saw him for last week unfortunately he has a new area on the proximal portion of his right medial/posterior lower extremity where the wrap somewhat slowed down and caused swelling and a blister to rub and open. Unfortunately this is the only opening that he has on either leg at this point. 02/17/19 on evaluation today patient's bilateral lower extremities appear to be doing well. He still completely healed in regard to the left lower extremity. In regard to the right lower extremity the area where the wrap and slid down and caused the blister still seems to be slightly open although this is dramatically better than during the last evaluation two weeks ago. I'm very pleased with the  way this stands overall. 03/03/19 on evaluation today patient appears to be doing well in regard to his right lower extremity in general although he did have a new blister open this does not appear to be showing any evidence of active infection at this time. Fortunately there's No fevers, chills, nausea, or vomiting noted at this time. Overall I feel like he is making good progress it  does feel like that the right leg will we perform the [employer] seems to do with a bit better than three layer wrap on the left which slid down on him. We may switch to doing bilateral in the book wraps. 5/4; I have not seen Mr. Derossett in quite some time. According to our case manager he did not have an open wound on his left leg last week. He had 1 remaining wound on the right posterior medial calf. He arrives today with multiple openings on the left leg probably were blisters and/or wrap injuries from Unna boots. I do not think the Unna boot's will provide adequate compression on the left. I am also not clear about the frequency he is using the compression pumps. 03/17/19 on evaluation today patient appears to be doing excellent in regard to his lower extremities compared to last week's evaluation apparently. He had gotten significantly worse last week which is unfortunate. The [employer] wrap on the left did not seem to do very well for him at all and in fact it didn't control his swelling significantly enough he had an additional outbreak. Subsequently we go back to the four layer compression wrap on the left. This is good news. At least in that he is doing better and the wound seem to be killing him. He still has not heard anything from the lymphedema clinic. 03/24/19 on evaluation today patient actually appears to be doing much better in regard to his bilateral lower Trinity as compared to last week when I saw him. Fortunately there's no signs of active infection at this time. He has been tolerating the dressing changes without complication. Overall I'm extremely pleased with the progress and appearance in general. 04/07/19 on evaluation today patient appears to be doing well in regard to his bilateral lower extremities. His swelling is significantly down from where it was previous. With that being said he does have a couple blisters still open at this point but fortunately nothing that seems to  be too severe and again the majority of the larger openings has healed at this time. 04/14/19 on evaluation today patient actually appears to be doing quite well in regard to his bilateral lower extremities in fact I'm not even sure there's anything significantly open at this time at any site. Nonetheless he did have some trouble with these wraps where they are somewhat irritating him secondary to the fact that he has noted that the graph wasn't too close down to the end of this foot in a little bit short as well up to his knee. Otherwise things seem to be doing quite well. 04/21/19 upon evaluation today patient's wound bed actually showed evidence of being completely healed in regard to both lower extremities which is excellent news. There does not appear to be any signs of active infection which is also good news. I'm very pleased in this regard. No fevers, chills, nausea, or vomiting noted at this time. 04/28/19 on evaluation today patient appears to be doing a little bit worse in regard to both lower extremities on the left mainly due to  the fact that when he went infection disease the wrap was not wrapped quite high enough he developed a blister above this. On the right he is a small open area of nothing too significant but again this is continuing to give him some trouble he has been were in the Velcro compression that he has at home. 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 He's been tolerating the dressing changes without complication. Fortunately there's no signs of active infection at this time. No fevers, chills, nausea, or vomiting noted at this time. We have been trying to get an appointment with her lymphedema clinic in [facility] but unfortunately nobody can get them on phone with not been able to even fax information over the patient likewise is not been able to get in touch with them. Overall I'm not  sure exactly what's going on here with to reach out again today. 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 Still having a lot of drainage unfortunately. He tells me especially in the left but even on the right. There's no signs of active infection which is good news we've been using so ratcheted up to this point. 05/19/19 on evaluation today patient actually appears to be doing quite well with regard to his left lower extremity which is great news. Fortunately in regard to the right lower extremity has an issues with his wrap and he subsequently did remove this from what I'm understanding. Nonetheless long story short is what he had rewrapped once he removed it subsequently had maggots underneath this wrap whenever he came in for evaluation today. With that being said they were obviously completely cleaned away by the nursing staff. The visit today which is excellent news. However he does appear to potentially have some infection around the right ankle region where the maggots were located as well. He will likely require anabiotic therapy today. 05/26/19 on evaluation today patient actually appears to be doing much better in regard to his bilateral lower extremities. I feel like the infection is under much better control. With that being said there were maggots noted when the wrap was removed yet again today. Again this could have potentially been left over from previous although at this time there does not appear to be any signs of significant drainage there was obviously on the wrap some drainage as well this contracted gnats or otherwise. Either way I do not see anything that appears to be doing worse in my pinion and in fact I think his drainage has slowed down quite significantly likely mainly due to the fact to his infection being under better control. 06/02/2019 on evaluation today patient actually appears to  be doing well with regard to his bilateral lower extremities there is no signs of active infection at this time which is great news. With that being said he does have several open areas more so on the right than the left but nonetheless these are all significantly better than previously noted. 06/09/2019 on evaluation today patient actually appears to be doing well. His wrap stayed up and he did not cause any problems he had more drainage on the right compared to the left but overall I do not see any major issues at this time which is great news. 06/16/2019 on evaluation today patient appears to be doing excellent with regard to his lower extremities the only  area that is open is a new blister that can have opened as of today on the medial ankle on the left. Other than this he really seems to be doing great I see no major issues at this point. 06/23/2019 on evaluation today patient appears to be doing quite well with regard to his bilateral lower extremities. In fact he actually appears to be almost completely healed there is a small area of weeping noted of the right lower extremity just above the ankle. Nonetheless fortunately there is no signs of active infection at this time which is good news. No fevers, chills, nausea, vomiting, or diarrhea. 8/24; the patient arrived for a nurse visit today but complained of very significant pain in the left leg and therefore I was asked to look at this. Noted that he did not have an open area on the left leg last week nevertheless this was wrapped. The patient states that he is not been able to put his compression pumps on the left leg because of the discomfort. He has not been systemically unwell 06/30/2019 on evaluation today patient unfortunately despite being excellent last week is doing much worse with regard to his left lower extremity today. In fact he had to come in for a nurse on Monday where his left leg had to be rewrapped due to excessive weeping Dr.  Leanord Hawking placed him on doxycycline at that point. Fortunately there is no signs of active infection Systemically at this time which is good news. 07/07/2019 in regard to the patient's wounds today he actually seems to be doing well with his right lower extremity there really is nothing open or draining at this point this is great news. Unfortunately the left lower extremity is given him additional trouble at this time. There does not appear to be any signs of active infection nonetheless he does have a lot of edema and swelling noted at this point as well as blistering all of which has led to a much more poor appearing leg at this time compared to where it was 2 weeks ago when it was almost completely healed. Obviously this is a little discouraging for the patient. He is try to contact the lymphedema clinic in [place] he has not been able to get through to them. 07/14/2019 on evaluation today patient actually appears to be doing slightly better with regard to his left lower extremity ulcers. Overall I do feel like at least at the top of the wrap that we have been placing this area has healed quite nicely and looks much better. The remainder of the leg is showing signs of improvement. Unfortunately in the thigh area he still has an open region on the left and again on the right he has been utilizing just a Band-Aid on an area that also opened on the thigh. Again this is an area that were not able to wrap although we did do an Ace wrap to provide some compression that something that obviously is a little less effective than the compression wraps we have been using on the lower portion of the leg. He does have an appointment with the lymphedema clinic in [facility] on Friday. 07/21/2019 on evaluation today patient appears to be doing better with regard to his lower extremity ulcers. He has been tolerating the dressing changes without complication. Fortunately there is no signs of active infection at  this time. No fevers, chills, nausea, vomiting, or diarrhea. I did receive the paperwork from the physical therapist at the lymphedema clinic  in [place]. Subsequently I signed off on that this morning and sent that back to him for further progression with the treatment plan. 07/28/2019 on evaluation today patient appears to be doing very well with regard to his right lower extremity where I do not see any open wounds at this point. Fortunately he is feeling great as far as that is concerned as well. In regard to the left lower extremity he has been having issues with still several areas of weeping and edema although the upper leg is doing better his lower leg still I think is going require the compression wrap at this time. No fevers, chills, nausea, vomiting, or diarrhea. 08/04/2019 on evaluation today patient unfortunately is having new wounds on the right lower extremity. Again we have been using Unna boot wrap on that side. We switched him to using his juxta lite wrap at home. With that being said he tells me he has been using it although his legs extremely swollen and to be honest really does not appear that he has been. I cannot know that for sure however. Nonetheless he has multiple new wounds on the right lower extremity at this time. Obviously we will have to see about getting this rewrapped for him today. 08/11/2019 on evaluation today patient appears to be doing fairly well with regard to his wounds. He has been tolerating the dressing changes including the compression wraps without complication. He still has a lot of edema in his upper thigh regions bilaterally he is supposed to be seeing the lymphedema clinic on the 15th of this month once his wraps arrive for the upper part of his legs. 08/18/2019 on evaluation today patient appears to be doing well with regard to his bilateral lower extremities at this point. He has been tolerating the dressing changes without complication.  Fortunately there is no signs of active infection which is also good news. He does have a couple weeping areas on the first and second toe of the right foot he also has just a small area on the left foot upper leg and a small area on the left lower leg but overall he is doing quite well in my opinion. He is supposed to be getting his wraps shortly in fact tomorrow and then subsequently is seeing the lymphedema clinic next Wednesday on the 21st. Of note he is also leaving on the 25th to go on vacation for a week to the beach. For that reason and since there is some uncertainty about what there can be doing at lymphedema clinic next Wednesday I am get a make an appointment for next Friday here for [place] to see what we need to do for him prior to him leaving for vacation. 10/23; patient arrives in considerable pain predominantly in the upper posterior calf just distal to the popliteal fossa also in the wound anteriorly above the major wound. This is probably cellulitis and he has had this recurrently in the past. He has no open wound on the right side and he has had an Radio broadcast assistant in that area. Finally I note that he has an area on the left posterior calf which by enlarge is mostly epithelialized. This protrudes beyond the borders of the surrounding skin in the setting of dry scaly skin and lymphedema. The patient is leaving for [facility] on Sunday. Per his longstanding pattern, he will not take his compression pumps with him predominantly out of fear that they will be stolen. He therefore asked that we put a  Unna boot back on the right leg. He will also contact the wound care center in [facility] to see if they can change his dressing in the mid week. 11/3; patient returned from his vacation to [facility]. He was seen on 1 occasion at their wound care center. They did a 2 layer compression system as they did not have our 4-layer wrap. I am not completely certain what they put on the wounds. They  did not change the Unna boot on the right. The patient is also seeing a lymphedema specialist physical therapist in [place]. It appears that he has some compression sleeve for his thighs which indeed look quite a bit better than I am used to seeing. He pumps over these with his external compression pumps. 11/10; the patient has a new wound on the right medial thigh otherwise there is no open areas on the right. He has an area on the left leg posteriorly anteriorly and medially and an area over the left second toe. We have been using silver alginate. He thinks the injury on his thigh is secondary to friction from the compression sleeve he has. 11/17; the patient has a new wound on the right medial thigh last week. He thinks this is because he did not have a underlying stocking for his thigh juxta lite apparatus. He now has this. The area is fairly large and somewhat angry but I do not think he has underlying cellulitis. ooHe has a intact blister on the right anterior tibial area. ooSmall wound on the right great toe dorsally ooSmall area on the medial left calf. 11/30; the patient does not have any open areas on his right leg and we did not take his juxta lite stocking off. However he states that on Friday his compression wrap fell down lodging around his upper mid calf area. As usual this creates a lot of problems for him. He called urgently today to be seen for a nurse visit however the nurse visit turned into a provider visit because of extreme erythema and pain in the left anterior tibia extending laterally and posteriorly. The area that is problematic is extensive 10/06/2019 upon evaluation today patient actually appears to be doing poorly in regard to his left lower extremity. He Dr. Leanord Hawking did place him on doxycycline this past Monday apparently due to the fact that he was doing much worse in regard to this left leg. Fortunately the doxycycline does seem to be helping. Unfortunately  we are still having a very difficult time getting his edema under any type of control in order to anticipate discharge at some point. The only way were really able to control his lymphedema really is with compression wraps and that has only even seemingly temporary. He has been seeing a lymphedema clinic they are trying to help in this regard but still this has been somewhat frustrating in general for the patient. 10/13/19 on evaluation today patient appears to be doing excellent with regard to his right lower extremity as far as the wounds are concerned. His swelling is still quite extensive unfortunately. He is still having a lot of drainage from the thigh areas bilaterally which is unfortunate. He's been going to lymphedema clinic but again he still really does not have this edema under control as far as his lower extremities are concern. With regard to his left lower extremity this seems to be improving and I do believe the doxycycline has been of benefit for him. He is about to complete the  doxycycline. 10/20/2019 on evaluation today patient appears to be doing poorly in regard to his bilateral lower extremities. More in the right thigh he has a lot of irritation at this site unfortunately. In regard to the left lower extremity the wrap was not quite as high it appears and does seem to have caused him some trouble as well. Fortunately there is no evidence of systemic infection though he does have some blue-green drainage which has me concerned for the possibility of Pseudomonas. He tells me he is previously taking Cipro without complications and he really does not care for Levaquin however due to some of the side effects he has. He is not allergic to any medications specifically antibiotics that were aware of. 10/27/2019 on evaluation today patient actually does appear to be for the most part doing better when compared to last week's evaluation. With that being said he still has multiple open  wounds over the bilateral lower extremities. He actually forgot to start taking the Cipro and states that he still has the whole bottle. He does have several new blisters on left lower extremity today I think I would recommend he go ahead and take the Cipro based on what I am seeing at this point. 12/30-Patient comes at 1 week visit, 4 layer compression wraps on the left and Unna boot on the right, primary dressing Xtrasorb and silver alginate. Patient is taking his Cipro and has a few more days left probably 5-6, and the legs are doing better. He states he is using his compressions devices which I believe he has 11/10/2019 on evaluation today patient actually appears to be much better than last time I saw him 2 weeks ago. His wounds are significantly improved and overall I am very pleased in this regard. Fortunately there is no signs of active infection at this time. He is just a couple of days away from completing Cipro. Overall his edema is much better he has been using his lymphedema pumps which I think is also helping at this point. 11/17/2019 on evaluation today patient appears to be doing excellent in regard to his wounds in general. His legs are swollen but not nearly as much as they have been in the past. Fortunately he is tolerating the compression wraps without complication. No fevers, chills, nausea, vomiting, or diarrhea. He does have some erythema however in the distal portion of his right lower extremity specifically around the forefoot and toes there is a little bit of warmth here as well. 11/24/2019 on evaluation today patient appears to be doing well with regard to his right lower extremity I really do not see any open wounds at this point. His left lower extremity does have several open areas and his right medial thigh also is open. Other than this however overall the patient seems to be making good progress and I am very pleased at this  point. Objective Constitutional Well-nourished and well-hydrated in no acute distress. Vitals Time Taken: 10:38 AM, Height: 70 in, Weight: 380.2 lbs, BMI: 54.5, Temperature: 97.8 F, Pulse: 53 bpm, Respiratory Rate: 20 breaths/min, Blood Pressure: 146/41 mmHg, Capillary Blood Glucose: 161 mg/dl. Respiratory normal breathing without difficulty. Psychiatric this patient is able to make decisions and demonstrates good insight into disease process. Alert and Oriented x 3. pleasant and cooperative. General Notes: His wound bed currently showed signs of great granulation at all wound locations there does not appear to be any signs of infection the distal part of his right foot seems to be doing  much better as well which is also a positive thing. Fortunately there is no signs of active infection at this time. No fevers, chills, nausea, vomiting, or diarrhea. Integumentary (Hair, Skin) Wound #145R status is Open. Original cause of wound was Gradually Appeared. The wound is located on the Right,Medial Upper Leg. The wound measures 6cm length x 7cm width x 0.1cm depth; 32.987cm^2 area and 3.299cm^3 volume. There is no tunneling or undermining noted. There is a medium amount of serosanguineous drainage noted. The wound margin is flat and intact. There is large (67-100%) red granulation within the wound bed. There is no necrotic tissue within the wound bed. Wound #154 status is Open. Original cause of wound was Shear/Friction. The wound is located on the Left,Medial Lower Leg. The wound measures 0.2cm length x 0.2cm width x 0.1cm depth; 0.031cm^2 area and 0.003cm^3 volume. There is no tunneling or undermining noted. There is a small amount of serous drainage noted. The wound margin is distinct with the outline attached to the wound base. There is large (67-100%) pink granulation within the wound bed. There is no necrotic tissue within the wound bed. Wound #155 status is Open. Original cause of wound  was Blister. The wound is located on the Right,Distal,Medial Lower Leg. The wound measures 0cm length x 0cm width x 0cm depth; 0cm^2 area and 0cm^3 volume. There is no tunneling or undermining noted. There is a none present amount of drainage noted. The wound margin is distinct with the outline attached to the wound base. There is no granulation within the wound bed. There is no necrotic tissue within the wound bed. Wound #156 status is Open. Original cause of wound was Blister. The wound is located on the Left,Dorsal Foot. The wound measures 1.4cm length x 1cm width x 0.1cm depth; 1.1cm^2 area and 0.11cm^3 volume. There is no tunneling or undermining noted. There is a medium amount of serous drainage noted. The wound margin is flat and intact. There is large (67-100%) pink granulation within the wound bed. There is no necrotic tissue within the wound bed. 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 There was a Four Layer Compression Therapy Procedure by Yevonne Pax, RN. Post procedure Diagnosis Wound #154: Same as Pre-Procedure There was a Radio broadcast assistant Compression Therapy Procedure by Yevonne Pax, RN. Post procedure Diagnosis Wound #: Same as Pre-Procedure Plan Follow-up Appointments: Return Appointment in 1 week. Dressing Change Frequency: Do not change entire dressing for one week. - both lower legs Skin Barriers/Peri-Wound Care: Moisturizing lotion TCA Cream or Ointment  - to legs mixed with lotion Wound Cleansing: May shower with protection. Primary Wound Dressing: Wound #145R Right,Medial Upper Leg: Calcium Alginate with Silver Wound #156 Left,Dorsal Foot: Calcium Alginate with Silver Wound #154 Left,Medial Lower Leg: Calcium Alginate with Silver Secondary Dressing: Wound #145R Right,Medial Upper Leg: ABD pad - secured with tape Wound #154 Left,Medial Lower Leg: ABD pad Wound #156 Left,Dorsal Foot: Dry Gauze Edema Control: 4 layer compression: Left lower extremity - unna boot at upper portion of lower leg. Unna Boot to Right Lower Extremity Avoid standing  for long periods of time Elevate legs to the level of the heart or above for 30 minutes daily and/or when sitting, a frequency of: - throughout the day Exercise regularly Segmental Compressive Device. - lymphadema pumps 60 min 2 times per day Other: - lymphadema wraps to upper leg per [facility] clinic 1. I would recommend at this time that we go ahead and continue with the current wound care measures which include silver alginate to any open areas over the bilateral lower extremities. 2. I am also going to suggest that we continue with the 4 layer compression wrap of the left lower extremity along with the Unna boot to the right lower extremity this too seems to be doing excellent at this point. 3. I do recommend the patient needs to use his lymphedema pumps as well as elevate his legs on a regular basis I think still this is of utmost importance. We will see patient back for reevaluation in 1 week here in the clinic. If anything worsens or changes patient will contact our office for additional recommendations. Electronic Signature(s) Signed: 11/24/2019 11:35:38 AM By: Lenda Kelp PA-C Entered By: Lenda Kelp on 11/24/2019 11:35:37 -------------------------------------------------------------------------------- SuperBill Details Patient Name: Date of Service: [name], [name]  11/24/2019 Medical Record [id number] Patient Account Number: [id number] Date of Birth/Sex: Treating RN: [id number] (69 y.o. [name] Primary Care Provider: Nicoletta Ba Other Clinician: Referring Provider: Treating Provider/Extender:[name], [name], [name] in Treatment: 200 Diagnosis Coding ICD-10 Codes Code Description 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 skin L97.221 Non-pressure chronic ulcer of left calf limited to breakdown of skin 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 of bilateral lower extremity I89.0 Lymphedema, not elsewhere classified E11.622 Type 2 diabetes mellitus with other skin ulcer 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, unspecified L03.116 Cellulitis of left lower limb Facility Procedures CPT4 Code Description: [id number] (Facility Use Only) [phone] - APPLY UNNA BOOT RT Modifier: Quantity: 1 CPT4 Code Description: [id number] (Facility Use Only) [id number] - APPLY MULTLAY COMPRS LWR LT LEG Modifier: 59 Quantity: 1 Physician Procedures CPT4: Description Modifier Quantity Code [id number] - WC PHYS LEVEL 4 - EST PT 1 ICD-10 Diagnosis Description 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 skin L97.221 Non-pressure chronic ulcer of left calf limited to  breakdown of skin 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 of bilateral lower extremity I89.0 Lymphedema, not elsewhere classified Electronic Signature(s) Signed: 11/24/2019 11:36:28 AM By: Lenda Kelp PA-C Previous Signature: 11/24/2019 11:36:03 AM Version By: Lenda Kelp PA-C Entered By: Lenda Kelp on 11/24/2019 11:36:24

## 2019-11-26 ENCOUNTER — Encounter (HOSPITAL_BASED_OUTPATIENT_CLINIC_OR_DEPARTMENT_OTHER): Payer: Medicare Other | Admitting: Internal Medicine

## 2019-11-26 ENCOUNTER — Other Ambulatory Visit: Payer: Self-pay

## 2019-11-26 DIAGNOSIS — L97221 Non-pressure chronic ulcer of left calf limited to breakdown of skin: Secondary | ICD-10-CM | POA: Diagnosis not present

## 2019-11-26 DIAGNOSIS — L03116 Cellulitis of left lower limb: Secondary | ICD-10-CM | POA: Diagnosis not present

## 2019-11-26 DIAGNOSIS — I10 Essential (primary) hypertension: Secondary | ICD-10-CM | POA: Diagnosis not present

## 2019-11-26 DIAGNOSIS — E1140 Type 2 diabetes mellitus with diabetic neuropathy, unspecified: Secondary | ICD-10-CM | POA: Diagnosis not present

## 2019-11-26 DIAGNOSIS — I87333 Chronic venous hypertension (idiopathic) with ulcer and inflammation of bilateral lower extremity: Secondary | ICD-10-CM | POA: Diagnosis not present

## 2019-11-26 DIAGNOSIS — I872 Venous insufficiency (chronic) (peripheral): Secondary | ICD-10-CM | POA: Diagnosis not present

## 2019-11-26 DIAGNOSIS — I890 Lymphedema, not elsewhere classified: Secondary | ICD-10-CM | POA: Diagnosis not present

## 2019-11-26 DIAGNOSIS — Z881 Allergy status to other antibiotic agents status: Secondary | ICD-10-CM | POA: Diagnosis not present

## 2019-11-26 DIAGNOSIS — I482 Chronic atrial fibrillation, unspecified: Secondary | ICD-10-CM | POA: Diagnosis not present

## 2019-11-26 DIAGNOSIS — E11622 Type 2 diabetes mellitus with other skin ulcer: Secondary | ICD-10-CM | POA: Diagnosis not present

## 2019-11-26 DIAGNOSIS — Z9119 Patient's noncompliance with other medical treatment and regimen: Secondary | ICD-10-CM | POA: Diagnosis not present

## 2019-11-26 NOTE — Progress Notes (Signed)
BAYLEY, HURN ([id number]) Visit Report for 11/26/2019 Arrival Information Details Patient Name: Date of Service: ISAIS, KLIPFEL 11/26/2019 3:45 PM Medical Record [id number] Patient Account Number: [id number] Date of Birth/Sex: Treating RN: [birth year] (69 y.o. [name] Primary Care Tyeler Goedken: Nicoletta Ba Other Clinician: Referring [place] Perra: Treating Brynnlie Unterreiner/Extender:Robson, Lottie Rater, [name] in Treatment: 200 Visit Information History Since Last Visit Walker Added or deleted any medications: No Patient Arrived: Any new allergies or adverse reactions: No Arrival Time: 15:26 Had a fall or experienced change in No Accompanied By: self activities of daily living that may affect Transfer Assistance: None risk of falls: Patient Identification Verified: Yes Signs or symptoms of abuse/neglect since last No Secondary Verification Process Completed: Yes visito Patient Requires Transmission-Based No Hospitalized since last visit: No Precautions: Implantable device outside of the clinic excluding No Patient Has Alerts: Yes cellular tissue based products placed in the center since last visit: Has Dressing in Place as Prescribed: Yes Has Compression in Place as Prescribed: Yes Pain Present Now: No Notes left leg dressing needing rewrapped only. Electronic Signature(s) Signed: 11/26/2019 5:44:09 PM By: Shawn Stall Entered By: Shawn Stall on 11/26/2019 15:54:01 -------------------------------------------------------------------------------- Compression Therapy Details Patient Name: Date of Service: LEIGH, KAEDING 11/26/2019 3:45 PM Medical Record [id number] Patient Account Number: [id number] Date of Birth/Sex: Treating RN: Sep 04, 1951 (69 y.o. [name] Primary Care Coley Littles: Nicoletta Ba Other Clinician: Referring Kalayla Shadden: Treating Karlen Barbar/Extender:Robson, Lottie Rater, [name] in Treatment: 200 Compression Therapy  Performed for Wound Wound #154 Left,Medial Lower Leg Assessment: Performed By: Clinician Shawn Stall, RN Compression Type: Four Layer Electronic Signature(s) Signed: 11/26/2019 5:44:09 PM By: Shawn Stall Entered By: Shawn Stall on 11/26/2019 15:54:48 -------------------------------------------------------------------------------- Compression Therapy Details Patient Name: Date of Service: PARKE, JANDREAU 11/26/2019 3:45 PM Medical Record [id number] Patient Account Number: [id number] Date of Birth/Sex: Treating RN: February 14, 1951 (69 y.o. [name] Primary Care Countess Biebel: Nicoletta Ba Other Clinician: Referring Panhia Karl: Treating Astria Jordahl/Extender:Robson, Lottie Rater, [name] in Treatment: 200 Compression Therapy Performed for Wound Wound #156 Left,Dorsal Foot Assessment: Performed By: Clinician Shawn Stall, RN Compression Type: Four Layer Electronic Signature(s) Signed: 11/26/2019 5:44:09 PM By: Shawn Stall Entered By: Shawn Stall on 11/26/2019 15:54:49 -------------------------------------------------------------------------------- Encounter Discharge Information Details Patient Name: Date of Service: THOMSON, HERBERS 11/26/2019 3:45 PM Medical Record [id number] Patient Account Number: [id number] Date of Birth/Sex: Treating RN: 1950-11-13 (69 y.o. [name] Primary Care Brieonna Crutcher: Nicoletta Ba Other Clinician: Referring Deacon Gadbois: Treating Yaslene Lindamood/Extender:Robson, Lottie Rater, [name] in Treatment: 200 Encounter Discharge Information Items Discharge Condition: Stable Ambulatory Status: Walker Discharge Destination: Home Transportation: Private Auto Accompanied By: self Schedule Follow-up Appointment: Yes Clinical Summary of Care: Electronic Signature(s) Signed: 11/26/2019 5:44:09 PM By: Shawn Stall Entered By: Shawn Stall on 11/26/2019  15:56:28 -------------------------------------------------------------------------------- Patient/Caregiver Education Details Patient Name: Date of Service: [name] 1/22/2021andnbsp3:45 PM Medical Record [id number] Patient Account Number: [id number] Date of Birth/Gender: Treating RN: [birth year] (68 y.o. [name] Primary Care Physician: Nicoletta Ba Other Clinician: Referring Physician: Treating Physician/Extender:Robson, Lottie Rater, [name] in Treatment: 200 Education Assessment Education Provided To: Patient Education Topics Provided Wound/Skin Impairment: Handouts: Skin Care Do's and Dont's Methods: Explain/Verbal Responses: Reinforcements needed Electronic Signature(s) Signed: 11/26/2019 5:44:09 PM By: Shawn Stall Entered By: Shawn Stall on 11/26/2019 15:56:17 -------------------------------------------------------------------------------- Wound Assessment Details Patient Name: Date of Service: NIKOS, ANGLEMYER 11/26/2019 3:45 PM Medical Record [id number] Patient Account Number: [id number] Date of Birth/Sex: Treating RN: 11/18/1950 (69 y.o. [name] Primary Care Talan Gildner: Nicoletta Ba Other Clinician: Referring  Javin Nong: Treating Tylek Boney/Extender:Robson, Peyton Najjar, [name] in Treatment: 200 Wound Status Wound Number: [id number] Primary Etiology: Lymphedema Wound Location: Left, Medial Lower Leg Wound Status: Open Wounding Event: Shear/Friction Date Acquired: 11/10/2019 Weeks Of Treatment: 2 Clustered Wound: No Wound Measurements Length: (cm) 0.2 Width: (cm) 0.2 Depth: (cm) 0.1 Area: (cm) 0.031 Volume: (cm) 0.003 Wound Description Full Thickness Without Exposed Suppor Classification: Structures % Reduction in Area: 80.3% % Reduction in Volume: 81.3% t Treatment Notes Wound #154 (Left, Medial Lower Leg) 1. Cleanse With Soap and water 2. Periwound Care Moisturizing lotion TCA Cream 3. Primary  Dressing Applied Calcium Alginate Ag 4. Secondary Dressing ABD Pad Kerramax/Xtrasorb 6. Support Layer Applied 4 layer compression wrap Notes unna boot first layer applied to upper portion of lower leg to secure wrap in place. Electronic Signature(s) Signed: 11/26/2019 5:44:09 PM By: Deon Pilling Entered By: Deon Pilling on 11/26/2019 15:54:19 -------------------------------------------------------------------------------- Wound Assessment Details Patient Name: Date of Service: LYSLE, YERO 11/26/2019 3:45 PM Medical Record [id number] Patient Account Number: [id number] Date of Birth/Sex: Treating RN: Mar 21, 1951 (69 y.o. [name] Primary Care Shontay Wallner: [name] Other Clinician: Referring Tenea Sens: Treating Haidy Kackley/Extender:Robson, Peyton Najjar, [name] in Treatment: 200 Wound Status Wound Number: [id number] Primary Diabetic Wound/Ulcer of the Lower Etiology: Extremity Wound Location: Left, Dorsal Foot Wound Status: Open Wounding Event: Blister Date Acquired: 11/24/2019 Weeks Of Treatment: 0 Clustered Wound: No Wound Measurements Length: (cm) 1.4 Width: (cm) 1 Depth: (cm) 0.1 Area: (cm) 1.1 Volume: (cm) 0.11 Wound Description Classification: Grade 1 % Reduction in Area: 0% % Reduction in Volume: 0% Treatment Notes Wound #156 (Left, Dorsal Foot) 1. Cleanse With Soap and water 2. Periwound Care Moisturizing lotion TCA Cream 3. Primary Dressing Applied Calcium Alginate Ag 4. Secondary Dressing ABD Pad Kerramax/Xtrasorb 6. Support Layer Applied 4 layer compression wrap Notes unna boot first layer applied to upper portion of lower leg to secure wrap in place. Electronic Signature(s) Signed: 11/26/2019 5:44:09 PM By: Deon Pilling Entered By: Deon Pilling on 11/26/2019 15:54:19 -------------------------------------------------------------------------------- Vitals Details Patient Name: Date of Service: [name] 11/26/2019 3:45  PM Medical Record [id number] Patient Account Number: [id number] Date of Birth/Sex: Treating RN: December 02, 1950 (69 y.o. [name] Primary Care Kahliya Fraleigh: [name] Other Clinician: Referring Joliet Mallozzi: Treating Jamilett Ferrante/Extender:Robson, Peyton Najjar, [name] in Treatment: 200 Vital Signs Time Taken: 15:52 Temperature (F): 97.6 Height (in): 70 Pulse (bpm): 44 Weight (lbs): 380.2 Respiratory Rate (breaths/min): 20 Body Mass Index (BMI): 54.5 Blood Pressure (mmHg): 136/46 Capillary Blood Glucose (mg/dl): 142 Reference Range: 80 - 120 mg / dl Electronic Signature(s) Signed: 11/26/2019 5:44:09 PM By: Deon Pilling Entered By: Deon Pilling on 11/26/2019 15:53:24

## 2019-11-26 NOTE — Progress Notes (Signed)
FAY, SWIDER ([id number]) Visit Report for 11/26/2019 SuperBill Details Patient Name: Date of Service: DOUGLASS, [name] 11/26/2019 Medical Record [id number] Patient Account Number: [id number] Date of Birth/Sex: Treating RN: 10/14/1951 (69 y.o. 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

## 2019-11-29 DIAGNOSIS — I890 Lymphedema, not elsewhere classified: Secondary | ICD-10-CM | POA: Diagnosis not present

## 2019-11-29 DIAGNOSIS — R29898 Other symptoms and signs involving the musculoskeletal system: Secondary | ICD-10-CM | POA: Diagnosis not present

## 2019-11-29 DIAGNOSIS — R531 Weakness: Secondary | ICD-10-CM | POA: Diagnosis not present

## 2019-11-29 NOTE — Progress Notes (Signed)
[name] ([id number]) Visit Report for 11/24/2019 Arrival Information Details Patient Name: Date of Service: [name], [name] 11/24/2019 10:15 AM Medical Record [id number] Patient Account Number: [id number] Date of Birth/Sex: Treating RN: 1951/07/27 (69 y.o. [name] Primary Care [name]: [name] Other Clinician: Referring [name]: Treating [name]/Extender:[name], [name], [name] in Treatment: 200 Visit Information History Since Last Visit [name] Added or deleted any medications: No Patient Arrived: Any new allergies or adverse reactions: No Arrival Time: 10:34 Had a fall or experienced change in No Accompanied By: alone activities of daily living that may affect Transfer Assistance: None risk of falls: Patient Identification Verified: Yes Signs or symptoms of abuse/neglect since last No Secondary Verification Process Completed: Yes visito Patient Requires Transmission-Based No Hospitalized since last visit: No Precautions: Implantable device outside of the clinic excluding No Patient Has Alerts: Yes cellular tissue based products placed in the center since last visit: Has Dressing in Place as Prescribed: Yes Has Compression in Place as Prescribed: Yes Pain Present Now: No Electronic Signature(s) Signed: 11/29/2019 6:46:58 PM By: Levan Hurst RN, BSN Entered By: Levan Hurst on 11/24/2019 10:36:59 -------------------------------------------------------------------------------- Compression Therapy Details Patient Name: Date of Service: [name] 11/24/2019 10:15 AM Medical Record [id number] Patient Account Number: [id number] Date of Birth/Sex: Treating RN: 1951-09-25 (69 y.o. [name] Primary Care Codie Hainer: [name] Other Clinician: Referring Benz Vandenberghe: Treating Donovin Kraemer/Extender:[name], [name], [name] in Treatment: 200 Compression Therapy Performed for Wound NonWound Condition  Lymphedema - Right Leg Assessment: Performed By: Clinician Kevin Coria, RN Compression Type: [name] Post Procedure Diagnosis Same as Pre-procedure Electronic Signature(s) Signed: 11/24/2019 6:03:18 PM By: Kevin Gouty RN, BSN Entered By: [name] on 11/24/2019 11:32:25 -------------------------------------------------------------------------------- Compression Therapy Details Patient Name: Date of Service: [name], [name] 11/24/2019 10:15 AM Medical Record [id number] Patient Account Number: [id number] Date of Birth/Sex: Treating RN: 1950-11-16 (69 y.o. [name] Primary Care Chinita Schimpf: [name] Other Clinician: Referring Bertrum Helmstetter: Treating Elman Dettman/Extender:[name], [name], [name] in Treatment: 200 Compression Therapy Performed for Wound Wound #154 Left,Medial Lower Leg Assessment: Performed By: Clinician Kevin Coria, RN Compression Type: Four Layer Post Procedure Diagnosis Same as Pre-procedure Electronic Signature(s) Signed: 11/24/2019 6:03:18 PM By: Kevin Gouty RN, BSN Entered By: [name] on 11/24/2019 11:33:01 -------------------------------------------------------------------------------- Encounter Discharge Information Details Patient Name: Date of Service: [name], [name] 11/24/2019 10:15 AM Medical Record [id number] Patient Account Number: [id number] Date of Birth/Sex: Treating RN: 1951-04-23 (68 y.o. [name]) [name] Primary Care [name]: [name] Other Clinician: Referring [name]: Treating [name]/Extender:[name], [name], [name] in Treatment: 200 Encounter Discharge Information Items Discharge Condition: Stable Ambulatory Status: [name] Discharge Destination: Home Transportation: Private Auto Accompanied By: self Schedule Follow-up Appointment: Yes Clinical Summary of Care: Patient Declined Electronic Signature(s) Signed: 11/24/2019 5:42:11 PM By: Kevin Coria  RN Entered By: [name] on 11/24/2019 12:11:18 -------------------------------------------------------------------------------- Lower Extremity Assessment Details Patient Name: Date of Service: [name], [name] 11/24/2019 10:15 AM Medical Record [id number] Patient Account Number: [id number] Date of Birth/Sex: Treating RN: [birth year] (69 y.o. [name] Primary Care [name]: [name] Other Clinician: Referring Arian Murley: Treating [name]/Extender:[name], [name], [name] in Treatment: 200 Edema Assessment Assessed: [Left: No] [Right: No] Edema: [Left: Yes] [Right: Yes] Calf Left: Right: Point of Measurement: 31 cm From Medial Instep 43 cm 40 cm Ankle Left: Right: Point of Measurement: 12 cm From Medial Instep 25.8 cm 28 cm Vascular Assessment Pulses: Dorsalis Pedis Palpable: [Left:Yes] [Right:Yes] Electronic Signature(s) Signed: 11/29/2019 6:46:58 PM  By: Levan Hurst RN, BSN Entered By: Levan Hurst on 11/24/2019 10:50:56 -------------------------------------------------------------------------------- [place] Details Patient Name: Date of Service: [name], [name] 11/24/2019 10:15 AM Medical Record [id number] Patient Account Number: [id number] Date of Birth/Sex: Treating RN: [birth year] (69 y.o. [name] Primary Care [name]: [name] Other Clinician: Referring [name]: Treating [name]/Extender:[name], [name], [name] in Treatment: 200 Active Inactive Venous Leg Ulcer Nursing Diagnoses: Actual venous Insuffiency (use after diagnosis is confirmed) Goals: Patient will maintain optimal edema control Date Initiated: 09/10/2016 Target Resolution Date: 12/01/2019 Goal Status: Active Verify adequate tissue perfusion prior to therapeutic compression application Date Initiated: 09/10/2016 Date Inactivated: 11/28/2016 Goal Status: Met Interventions: Assess peripheral edema status  every visit. Compression as ordered Provide education on venous insufficiency Notes: edema not contolled above wraps, pt not using lymoh pumps regularly Wound/Skin Impairment Nursing Diagnoses: Impaired tissue integrity Goals: Patient/caregiver will verbalize understanding of skin care regimen Date Initiated: 09/10/2016 Target Resolution Date: 12/01/2019 Goal Status: Active Interventions: Assess patient/caregiver ability to perform ulcer/skin care regimen upon admission and as needed Assess ulceration(s) every visit Provide education on ulcer and skin care Notes: Electronic Signature(s) Signed: 11/24/2019 6:03:18 PM By: Kevin Gouty RN, BSN Entered By: [name] on 11/24/2019 11:16:05 -------------------------------------------------------------------------------- Pain Assessment Details Patient Name: Date of Service: [name], [name] 11/24/2019 10:15 AM Medical Record [id number] Patient Account Number: [id number] Date of Birth/Sex: Treating RN: [birth year] (69 y.o. [name] Primary Care Mitsue Peery: [name] Other Clinician: Referring Tinita Brooker: Treating Deshanna Kama/Extender:[name], [name], [name] in Treatment: 200 Active Problems Location of Pain Severity and Description of Pain Patient Has Paino No Site Locations Pain Management and Medication Current Pain Management: Electronic Signature(s) Signed: 11/29/2019 6:46:58 PM By: Levan Hurst RN, BSN Entered By: Levan Hurst on 11/24/2019 10:43:34 -------------------------------------------------------------------------------- Patient/Caregiver Education Details Patient Name: Date of Service: [name] 1/20/2021andnbsp10:15 AM Medical Record [id number] Patient Account Number: [id number] Date of Birth/Gender: Treating RN: 07-May-1951 (68 y.o. [name] Primary Care Physician: [name] Other Clinician: Referring Physician: Treating Physician/Extender:Stone  III, [name], [name] in Treatment: 200 Education Assessment Education Provided To: Patient Education Topics Provided Venous: Methods: Explain/Verbal Responses: Reinforcements needed, State content correctly Wound/Skin Impairment: Methods: Explain/Verbal Responses: Reinforcements needed, State content correctly Electronic Signature(s) Signed: 11/24/2019 6:03:18 PM By: Kevin Gouty RN, BSN Entered By: [name] on 11/24/2019 11:16:40 -------------------------------------------------------------------------------- Wound Assessment Details Patient Name: Date of Service: [name], [name] 11/24/2019 10:15 AM Medical Record [id number] Patient Account Number: [id number] Date of Birth/Sex: Treating RN: [birth year] (69 y.o. [name] Primary Care Gerod Caligiuri: [name] Other Clinician: Referring Janyce Ellinger: Treating Nonnie Pickney/Extender:[name], [name], [name] in Treatment: 200 Wound Status Wound Number: [id number] Primary Lymphedema Etiology: Wound Location: Right Upper Leg - Medial Wound Open Wounding Event: Gradually Appeared Status: Date Acquired: 09/12/2019 Comorbid Chronic sinus problems/congestion, [name] Of Treatment: 10 History: Arrhythmia, Hypertension, Peripheral Arterial Clustered Wound: No Disease, Type II Diabetes, History of Burn, Gout, Confinement Anxiety Photos Wound Measurements Length: (cm) 6 Width: (cm) 7 Depth: (cm) 0.1 Area: (cm) 32.987 Volume: (cm) 3.299 Wound Description Classification: Partial Thickness Wound Margin: Flat and Intact Exudate Amount: Medium Exudate Type: Serosanguineous Exudate Color: red, brown Wound Bed Granulation Amount: Large (67-100%) Granulation Quality: Red Necrotic Amount: None Present (0%) fter Cleansing: No ino No Exposed Structure sed: No Subcutaneous Tissue) Exposed: No sed: No sed: No ed: No d: No % Reduction in Area: -2700.3% % Reduction in Volume:  -2695.8% Epithelialization: Small (1-33%) Tunneling: No Undermining: No Foul Odor  A Slough/Fibr Fascia Expo Fat Layer ( Tendon Expo Muscle Expo Joint Expos Bone Expose Treatment Notes Wound #[id number] (Right, Medial Upper Leg) 1. Cleanse With Wound Cleanser 3. Primary Dressing Applied Calcium Alginate Ag 4. Secondary Dressing ABD Pad 5. Secured With Tape Notes secure with tape Electronic Signature(s) Signed: 11/25/2019 4:35:12 PM By: Mikeal Hawthorne EMT/HBOT Signed: 11/29/2019 6:46:58 PM By: Levan Hurst RN, BSN Entered By: Mikeal Hawthorne on 11/25/2019 09:58:17 -------------------------------------------------------------------------------- Wound Assessment Details Patient Name: Date of Service: [name], [name] 11/24/2019 10:15 AM Medical Record [id number] Patient Account Number: [id number] Date of Birth/Sex: Treating RN: June 14, 1951 (69 y.o. [name] Primary Care Goldie Tregoning: [name] Other Clinician: Referring Kayman Snuffer: Treating Shahara Hartsfield/Extender:[name], [name], [name] in Treatment: 200 Wound Status Wound Number: [id number] Primary Lymphedema Etiology: Wound Location: Left Lower Leg - Medial Wound Open Wounding Event: Shear/Friction Status: Date Acquired: 11/10/2019 Comorbid Chronic sinus problems/congestion, [name] Of Treatment: 2 History: Arrhythmia, Hypertension, Peripheral Arterial Clustered Wound: No Disease, Type II Diabetes, History of Burn, Gout, Confinement Anxiety Photos Wound Measurements Length: (cm) 0.2 % Reduct Width: (cm) 0.2 % Reduct Depth: (cm) 0.1 Epitheli Area: (cm) 0.031 Tunneli Volume: (cm) 0.003 Undermi Wound Description Full Thickness Without Exposed Support Foul Odo Classification: Structures Slough/F Wound Distinct, outline attached Margin: Exudate Small Amount: Exudate Serous Type: Exudate amber Color: Wound Bed Granulation Amount: Large (67-100%) Granulation Quality: Pink Fascia E Necrotic  Amount: None Present (0%) Fat Laye Tendon E Muscle E Joint Ex Bone Exp r After Cleansing: No ibrino No Exposed Structure xposed: No r (Subcutaneous Tissue) Exposed: No xposed: No xposed: No posed: No osed: No ion in Area: 80.3% ion in Volume: 81.3% alization: Large (67-100%) ng: No ning: No Treatment Notes Wound #154 (Left, Medial Lower Leg) 1. Cleanse With Wound Cleanser Soap and water 3. Primary Dressing Applied Calcium Alginate Ag 4. Secondary Dressing ABD Pad 6. Support Layer Applied 4 layer compression wrap Notes unna boot upper portion of lower leg. stocknette Electronic Signature(s) Signed: 11/25/2019 4:35:12 PM By: Mikeal Hawthorne EMT/HBOT Signed: 11/29/2019 6:46:58 PM By: Levan Hurst RN, BSN Entered By: Mikeal Hawthorne on 11/25/2019 10:02:25 -------------------------------------------------------------------------------- Wound Assessment Details Patient Name: Date of Service: [name], [name] 11/24/2019 10:15 AM Medical Record [id number] Patient Account Number: [id number] Date of Birth/Sex: Treating RN: 04-08-1951 (69 y.o. [name] Primary Care Aasim Restivo: [name] Other Clinician: Referring Analeise Mccleery: Treating Tanecia Mccay/Extender:[name], [name], [name] in Treatment: 200 Wound Status Wound Number: [id number] Primary Lymphedema Etiology: Wound Location: Right Lower Leg - Medial, Distal Wound Healed - Epithelialized Wounding Event: Blister Status: Date Acquired: 11/10/2019 Comorbid Chronic sinus problems/congestion, [name] Of Treatment: 2 History: Arrhythmia, Hypertension, Peripheral Arterial Clustered Wound: No Disease, Type II Diabetes, History of Burn, Gout, Confinement Anxiety Photos Wound Measurements Length: (cm) 0 % Reduc Width: (cm) 0 % Reduc Depth: (cm) 0 Epithel Area: (cm) 0 Tunnel Volume: (cm) 0 Underm Wound Description Classification: Full Thickness Without Exposed Support Foul O Structures Slough Wound  Distinct, outline attached Margin: Exudate None Present Amount: Wound Bed Granulation Amount: None Present (0%) Necrotic Amount: None Present (0%) Fascia Fat Lay Tendon Muscle Joint E Bone Ex Electronic Signature(s) Signed: 11/25/2019 4:35:12 PM By: Mikeal Hawthorne EMT/HBOT Signed: 11/29/2019 6:46:58 PM By: Levan Hurst RN, BSN Entered By: Mikeal Hawthorne on 01/21/ dor After Cleansing: No Lynita Lombard No Exposed Structure Exposed: No er (Subcutaneous Tissue) Exposed: No Exposed: No Exposed: No xposed: No posed: No 2021 09:57:38 tion in Area: 100% tion in Volume: 100% ialization: Large (67-100%) ing: No ining:  No -------------------------------------------------------------------------------- Wound Assessment Details Patient Name: Date of Service: [name], [name] 11/24/2019 10:15 AM Medical Record [id number] Patient Account Number: [id number] Date of Birth/Sex: Treating RN: 02/15/1951 (69 y.o. [name] Primary Care Mahlet Jergens: [name] Other Clinician: Referring Jazier Mcglamery: Treating Oliviya Gilkison/Extender:[name], [name], [name] in Treatment: 200 Wound Status Wound Number: 156 Primary Diabetic Wound/Ulcer of the Lower Extremity Etiology: Wound Location: Left Foot - Dorsal Wound Open Wounding Event: Blister Status: Date Acquired: 11/24/2019 Comorbid Chronic sinus problems/congestion, [name] Of Treatment: 0 History: Arrhythmia, Hypertension, Peripheral Arterial Clustered Wound: No Disease, Type II Diabetes, History of Burn, Gout, Confinement Anxiety Photos Wound Measurements Length: (cm) 1.4 Width: (cm) 1 Depth: (cm) 0.1 Area: (cm) 1.1 Volume: (cm) 0.11 Wound Description Classification: Grade 1 Wound Margin: Flat and Intact Exudate Amount: Medium Exudate Type: Serous Exudate Color: amber Wound Bed Granulation Amount: Large (67-100%) Granulation Quality: Pink Necrotic Amount: None Present (0%) fter Cleansing: No ino  No Exposed Structure Exposed: No yer (Subcutaneous Tissue) Exposed: No Exposed: No Exposed: No Exposed: No xposed: No % Reduction in Area: 0% % Reduction in Volume: 0% Epithelialization: None Tunneling: No Undermining: No Foul Odor A Slough/Fibr Fascia Fat La Tendon Muscle Joint Bone E Treatment Notes Wound #156 (Left, Dorsal Foot) 1. Cleanse With Wound Cleanser Soap and water 2. Periwound Care Moisturizing lotion TCA Cream 3. Primary Dressing Applied Calcium Alginate Ag 4. Secondary Dressing Dry Gauze 6. Support Layer Applied 4 layer compression wrap Notes layer of unna boot at top at knee to help secure, Biomedical scientist) Signed: 11/25/2019 4:35:12 PM By: Mikeal Hawthorne EMT/HBOT Signed: 11/29/2019 6:46:58 PM By: Levan Hurst RN, BSN Entered By: Mikeal Hawthorne on 11/25/2019 09:59:38 -------------------------------------------------------------------------------- [place] Details Patient Name: Date of Service: [name], BERTZ 11/24/2019 10:15 AM Medical Record [id number] Patient Account Number: [id number] Date of Birth/Sex: Treating RN: [birth year] (69 y.o. [name] Primary Care Kelyn Ponciano: [name] Other Clinician: Referring Harrol Novello: Treating Dalya Maselli/Extender:[name], [name], [name] in Treatment: 200 Vital Signs Time Taken: 10:38 Temperature (F): 97.8 Height (in): 70 Pulse (bpm): 53 Weight (lbs): 380.2 Respiratory Rate (breaths/min): 20 Body Mass Index (BMI): 54.5 Blood Pressure (mmHg): 146/41 Capillary Blood Glucose (mg/dl): 161 Reference Range: 80 - 120 mg / dl Electronic Signature(s) Signed: 11/29/2019 6:46:58 PM By: Levan Hurst RN, BSN Entered By: Levan Hurst on 11/24/2019 10:43:26

## 2019-11-30 DIAGNOSIS — E1142 Type 2 diabetes mellitus with diabetic polyneuropathy: Secondary | ICD-10-CM | POA: Diagnosis not present

## 2019-11-30 DIAGNOSIS — B351 Tinea unguium: Secondary | ICD-10-CM | POA: Diagnosis not present

## 2019-11-30 DIAGNOSIS — Z794 Long term (current) use of insulin: Secondary | ICD-10-CM | POA: Diagnosis not present

## 2019-12-01 ENCOUNTER — Encounter (HOSPITAL_BASED_OUTPATIENT_CLINIC_OR_DEPARTMENT_OTHER): Payer: Medicare Other | Admitting: Physician Assistant

## 2019-12-01 ENCOUNTER — Other Ambulatory Visit: Payer: Self-pay

## 2019-12-01 DIAGNOSIS — I87333 Chronic venous hypertension (idiopathic) with ulcer and inflammation of bilateral lower extremity: Secondary | ICD-10-CM | POA: Diagnosis not present

## 2019-12-01 DIAGNOSIS — I482 Chronic atrial fibrillation, unspecified: Secondary | ICD-10-CM | POA: Diagnosis not present

## 2019-12-01 DIAGNOSIS — E1140 Type 2 diabetes mellitus with diabetic neuropathy, unspecified: Secondary | ICD-10-CM | POA: Diagnosis not present

## 2019-12-01 DIAGNOSIS — L97529 Non-pressure chronic ulcer of other part of left foot with unspecified severity: Secondary | ICD-10-CM | POA: Diagnosis not present

## 2019-12-01 DIAGNOSIS — L97229 Non-pressure chronic ulcer of left calf with unspecified severity: Secondary | ICD-10-CM | POA: Diagnosis not present

## 2019-12-01 DIAGNOSIS — I872 Venous insufficiency (chronic) (peripheral): Secondary | ICD-10-CM | POA: Diagnosis not present

## 2019-12-01 DIAGNOSIS — E11622 Type 2 diabetes mellitus with other skin ulcer: Secondary | ICD-10-CM | POA: Diagnosis not present

## 2019-12-01 DIAGNOSIS — Z881 Allergy status to other antibiotic agents status: Secondary | ICD-10-CM | POA: Diagnosis not present

## 2019-12-01 DIAGNOSIS — L03116 Cellulitis of left lower limb: Secondary | ICD-10-CM | POA: Diagnosis not present

## 2019-12-01 DIAGNOSIS — L97119 Non-pressure chronic ulcer of right thigh with unspecified severity: Secondary | ICD-10-CM | POA: Diagnosis not present

## 2019-12-01 DIAGNOSIS — I10 Essential (primary) hypertension: Secondary | ICD-10-CM | POA: Diagnosis not present

## 2019-12-01 DIAGNOSIS — L97219 Non-pressure chronic ulcer of right calf with unspecified severity: Secondary | ICD-10-CM | POA: Diagnosis not present

## 2019-12-01 DIAGNOSIS — L97221 Non-pressure chronic ulcer of left calf limited to breakdown of skin: Secondary | ICD-10-CM | POA: Diagnosis not present

## 2019-12-01 DIAGNOSIS — Z9119 Patient's noncompliance with other medical treatment and regimen: Secondary | ICD-10-CM | POA: Diagnosis not present

## 2019-12-01 DIAGNOSIS — I890 Lymphedema, not elsewhere classified: Secondary | ICD-10-CM | POA: Diagnosis not present

## 2019-12-01 NOTE — Progress Notes (Addendum)
[name] ([id number]) Visit Report for 12/01/2019 Chief Complaint Document Details Patient Name: Date of Service: [name], [name] 12/01/2019 10:15 AM Medical Record [id number] Patient Account Number: [id number] Date of Birth/Sex: Treating RN: 1950/12/27 (69 y.o. M) Primary Care Provider: Nicoletta [name] Other Clinician: Referring Provider: Treating Provider/Extender:[name], [name], [name] in Treatment: 201 Information Obtained from: Patient Chief Complaint patient is here for evaluation venous/lymphedema weeping Electronic Signature(s) Signed: 12/01/2019 11:02:29 AM By: Lenda Kelp PA-C Entered By: Lenda Kelp on 12/01/2019 11:02:29 -------------------------------------------------------------------------------- HPI Details Patient Name: Date of Service: [name], [name] 12/01/2019 10:15 AM Medical Record [id number] Patient Account Number: [id number] Date of Birth/Sex: Treating RN: May 27, 1951 (69 y.o. M) Primary Care Provider: Nicoletta [name] Other Clinician: Referring Provider: Treating Provider/Extender:[name], [name], [name] in Treatment: 201 History of Present Illness HPI Description: Referred by PCP for consultation. Patient has long standing history of BLE venous stasis, no prior ulcerations. At beginning of month, developed cellulitis and weeping. Received IM Rocephin followed by Keflex and resolved. Wears compression stocking, appr 6 months old. Not sure strength. No present drainage. 01/22/16 this is a patient who is a type II diabetic on insulin. 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 He tells me he religiously wears pressure stockings of uncertain strength. He was here with weeping edema about 8 months ago but did not have an open wound. Roughly a month ago he had a reopening on his bilateral legs. He is been using bandages and Neosporin. He does not complain of pain. 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 Last BUN/creatinine I have is from 11/20/15 at 13 and 1.0 respectively 01/29/16; patient arrives today having tolerated the Profore wrap. He brought in his stockings and these are 18 mmHg stockings he bought from [employer]. The compression here is likely inadequate. He does not complain of pain or excessive drainage she has no systemic symptoms. The wound on the right looks improved as does the one on the left although one on the left is more substantial with still tissue at risk below the actual wound area on the bilateral posterior calf 02/05/16; patient arrives with poor edema control. He states that we did put a 4 layer compression on it last week. No weight appear 5 this. 02/12/16; the area on the posterior right Has healed. The left Has a substantial wound that has necrotic surface eschar that requires a debridement with a curette. 02/16/16;the patient called or a Nurse visit secondary to increased swelling. He had been in earlier in the week with his right leg healed. He was transitioned to is on pressure stocking on the right leg with the only open wound on the left, a substantial area on the left posterior calf. Note he has a history of severe lower extremity edema, he has a history of chronic atrial fibrillation but not heart failure per my notes but I'll need to research this. He is not complaining of chest pain shortness of breath or orthopnea. The intake nurse noted blisters on the previously closed right leg 02/19/16; this is the patient's regular visit day. I see him on Friday with escalating edema new wounds on the right leg and clear signs of at least right ventricular heart failure. I increased his Lasix to 40 twice a day. He is returning currently in follow-up. States he is noticed a decrease  in that the edema 02/26/16 patient's legs have much  less edema. There is nothing really open on the right leg. The left leg has improved condition of the large superficial wound on the posterior left leg 03/04/16; edema control is very much better. The patient's right leg wounds have healed. On the left leg he continues to have severe venous inflammation on the posterior aspect of the left leg. There is no tenderness and I don't think any of this is cellulitis. 03/11/16; patient's right leg is married healed and he is in his own stocking. The patient's left leg has deteriorated somewhat. There is a lot of erythema around the wound on the posterior left leg. There is also a significant rim of erythema posteriorly just above where the wrap would've ended there is a new wound in this location and a lot of tenderness. Can't rule out cellulitis in this area. 03/15/16; patient's right leg remains healed and he is in his own stocking. The patient's left leg is much better than last review. His major wound on the posterior aspect of his left Is almost fully epithelialized. He has 3 small injuries from the wraps. Really. Erythema seems a lot better on antibiotics 03/18/16; right leg remains healed and he is in his own stocking. The patient's left leg is much better. The area on the posterior aspect of the left calf is fully epithelialized. His 3 small injuries which were wrap injuries on the left are improved only one seems still open his erythema has resolved 03/25/16; patient's right leg remains healed and he is in his own stocking. There is no open area today on the left leg posterior leg is completely closed up. His wrap injuries at the superior aspect of his leg are also resolved. He looks as though he has some irritation on the dorsal ankle but this is fully epithelialized without evidence of infection. 03/28/16; we discharged this patient on Monday. Transitioned him into his own stocking. There were problems almost immediately with uncontrolled swelling  weeping edema multiple some of which have opened. He does not feel systemically unwell in particular no chest pain no shortness of breath and he does not feel 04/08/16; the edema is under better control with the Profore light wrap but he still has pitting edema. There is one large wound anteriorly 2 on the medial aspect of his left leg and 3 small areas on the superior posterior calf. Drainage is not excessive he is tolerating a Profore light well 04/15/16; put a Profore wrap on him last week. This is controlled is edema however he had a lot of pain on his left anterior foot most of his wounds are healed 04/22/16 once again the patient has denuded areas on the left anterior foot which he states are because his wrap slips up word. He saw his primary physician today is on Lasix 40 twice a day and states that he his weight is down 20 pounds over the last 3 months. 04/29/16: Much improved. left anterior foot much improved. He is now on Lasix 80 mg per day. Much improved edema control 05/06/16; I was hoping to be able to discharge him today however once again he has blisters at a low level of where the compression was placed last week mostly on his left lateral but also his left medial leg and a small area on the anterior part of the left foot. 05/09/16; apparently the patient went home after his appointment on 7/4 later in the evening developing pain  in his upper medial thigh together with subjective fever and chills although his temperature was not taken. The pain was so intense he felt he would probably have to call 911. However he then remembered that he had leftover doxycycline from a previous round of antibiotics and took these. By the next morning he felt a lot better. He called and spoke to one of our nurses and I approved doxycycline over the phone thinking that this was in relation to the wounds we had previously seen although they were definitely were not. The patient feels a lot better old fever no  chills he is still working. Blood sugars are reasonably controlled 05/13/16; patient is back in for review of his cellulitis on his anterior medial upper thigh. He is taking doxycycline this is a lot better. Culture I did of the nodular area on the dorsal aspect of his foot grew MRSA this also looks a lot better. 05/20/16; the patient is cellulitis on the medial upper thigh has resolved. All of his wound areas including the left anterior foot, areas on the medial aspect of the left calf and the lateral aspect of the calf at all resolved. He has a new blister on the left dorsal foot at the level of the fourth toe this was excised. No evidence of infection 05/27/16; patient continues to complain weeping edema. He has new blisterlike wounds on the left anterior lateral and posterior lateral calf at the top of his wrap levels. The area on his left anterior foot appears better. He is not complaining of fever, pain or pruritus in his feet. 05/30/16; the patient's blisters on his left anterior leg posterior calf all look improved. He did not increase the Lasix 100 mg as I suggested because he was going to run out of his 40 mg tablets. He is still having weeping edema of his toes 06/03/16; I renewed his Lasix at 80 mg once a day as he was about to run out when I last saw him. He is on 80 mg of Lasix now. I have asked him to cut down on the excessive amount of water he was drinking and asked him to drink according to his thirst mechanisms 06/12/2016 -- was seen 2 days ago and was supposed to wear his compression stockings at home but he is developed lymphedema and superficial blisters on the left lower extremity and hence came in for a review 06/24/16; the remaining wound is on his left anterior leg. He still has edema coming from between his toes. There is lymphedema here however his edema is generally better than when I last saw this. He has a history of atrial fibrillation but does not have a known history  of congestive heart failure nevertheless I think he probably has this at least on a diastolic basis. 07/01/16 I reviewed his echocardiogram from January 2017. This was essentially normal. He did not have LVH, EF of 55-60%. His right ventricular function was normal although he did have trivial tricuspid and pulmonic regurgitation. This is not audible on exam however. I increased his Lasix to do massive edema in his legs well above his knees I think in early July. He was also drinking an excessive amount of water at the time. 07/15/16; missed his appointment last week because of the Labor Day holiday on Monday. He could not get another appointment later in the week. Started to feel the wrap digging in superiorly so we remove the top half and the bottom half of his wrap.  He has extensive erythema and blistering superiorly in the left leg. Very tender. Very swollen. Edema in his foot with leaking edema fluid. He has not been systemically unwell 07/22/16; the area on the left leg laterally required some debridement. The medial wounds look more stable. His wrap injury wounds appear to have healed. Edema and his foot is better, weeping edema is also better. He tells me he is meeting with the supplier of the external compression pumps at work 08/05/16; the patient was on vacation last week in [facility]. His wrap is been on for an extended period of time. Also over the weekend he developed an extensive area of tender erythema across his anterior medial thigh. He took to doxycycline yesterday that he had leftover from a previous prescription. The patient complains of weeping edema coming out of his toes 08/08/16; I saw this patient on 10/2. He was tender across his anterior thigh. I put him on doxycycline. He returns today in follow-up. He does not have any open wounds on his lower leg, he still has edema weeping into his toes. 08/12/16; patient was seen back urgently today to follow-up for his extensive left  thigh cellulitis/erysipelas. He comes back with a lot less swelling and erythema pain is much better. I believe I gave him Augmentin and Cipro. His wrap was cut down as he stated a roll down his legs. He developed blistering above the level of the wrap that remained. He has 2 open blisters and 1 intact. 08/19/16; patient is been doing his primary doctor who is increased his Lasix from 40-80 once a day or 80 already has less edema. Cellulitis has remained improved in the left thigh. 2 open areas on the posterior left calf 08/26/16; he returns today having new open blisters on the anterior part of his left leg. He has his compression pumps but is not yet been shown how to use some vital representative from the supplier. 09/02/16 patient returns today with no open wounds on the left leg. Some maceration in his plantar toes 09/10/2016 -- Dr. Leanord Hawking had recently discharged him on 09/02/2016 and he has come right back with redness swelling and some open ulcers on his left lower extremity. He says this was caused by trying to apply his compression stockings and he's been unable to use this and has not been able to use his lymphedema pumps. He had some doxycycline leftover and he has started on this a few days ago. 09/16/16; there are no open wounds on his leg on the left and no evidence of cellulitis. 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 He does not complain of symptoms here. I am not clear use using his external compression pumps. 09/23/16; I have not seen this patient in 2 [name]. He canceled his appointment 10 days ago as he was going on vacation. He tells me that on Monday he noticed a large area on his posterior left leg which is been draining copiously and is reopened into a large wound. He is been using ABDs and the external part of his juxtalite, according to our nurse this was not on properly. 10/07/16; Still a substantial area on the  posterior left leg. Using silver alginate 10/14/16; in general better although there is still open area which looks healthy. Still using silver alginate. He reminds me that this happen before he left for [facility]. Today while he was showering in the morning. He had been using  his juxtalite's 10/21/16; the area on his posterior left leg is fully epithelialized. However he arrives today with a large area of tender erythema in his medial and posterior left thigh just above the knee. I have marked the area. Once again he is reluctant to consider hospitalization. I treated him with oral antibiotics in the past for a similar situation with resolution I think with doxycycline however this area it seems more extensive to me. He is not complaining of fever but does have chills and says states he is thirsty. His blood sugar today was in the 140s at home 10/25/16 the area on his posterior left leg is fully epithelialized although there is still some weeping edema. The large area of tenderness and erythema in his medial and posterior left thigh is a lot less tender although there is still a lot of swelling in this thigh. He states he feels a lot better. He is on doxycycline and Augmentin that I started last week. This will continued until Tuesday, December 26. I have ordered a duplex ultrasound of the left thigh rule out DVT whether there is an abscess something that would need to be drained I would also like to know. 11/01/16; he still has weeping edema from a not fully epithelialized area on his left posterior calf. Most of the rest of this looks a lot better. He has completed his antibiotics. His thigh is a lot better. Duplex ultrasound did not show a DVT in the thigh 11/08/16; he comes in today with more Denuded surface epithelium from the posterior aspect of his calf. There is no real evidence of cellulitis. The superior aspect of his wrap appears to have put quite an indentation in his leg just below  the knee and this may have contributed. He does not complain of pain or fever. We have been using silver alginate as the primary dressing. The area of cellulitis in the right thigh has totally resolved. He has been using his compression stockings once a week 11/15/16; the patient arrives today with more loss of epithelium from the posterior aspect of his left calf. He now has a fairly substantial wound in this area. The reason behind this deterioration isn't exactly clear although his edema is not well controlled. He states he feels he is generally more swollen systemically. He is not complaining of chest pain shortness of breath fever. Tells me he has an appointment with his primary physician in early February. He is on 80 mg of oral Lasix a day. He claims compliance with the external compression pumps. He is not having any pain in his legs similar to what he has with his recurrent cellulitis 11/22/16; the patient arrives a follow-up of his large area on his left lateral calf. This looks somewhat better today. He came in earlier in the week for a dressing change since I saw him a week ago. He is not complaining of any pain no shortness of breath no chest pain 11/28/16; the patient arrives for follow-up of his large area on the left lateral calf this does not look better. In fact it is larger weeping edema. The surface of the wound does not look too bad. We have been using silver alginate although I'm not certain that this is a dressing issue. 12/05/16; again the patient follows up for a large wound on the left lateral and left posterior calf this does not look better. There continues to be weeping edema necrotic surface tissue. More worrisome than this once again there  is erythema below the wound involving the distal Achilles and heel suggestive of cellulitis. He is on his feet working most of the day of this is not going well. We are changing his dressing twice a week to facilitate the  drainage. 12/12/16; not much change in the overall dimensions of the large area on the left posterior calf. This is very inflamed looking. I gave him an. Doxycycline last week does not really seem to have helped. He found the wrap very painful indeed it seems to of dog into his legs superiorly and perhaps around the heel. He came in early today because the drainage had soaked through his dressings. 12/19/16- patient arrives for follow-up evaluation of his left lower extremity ulcers. He states that he is using his lymphedema pumps once daily when there is "no drainage". He admits to not using his lipedema pumps while under current treatment. His blood sugars have been consistently between 150-200. 12/26/16; the patient is not using his compression pumps at home because of the wetness on his feet. I've advised him that I think it's important for him to use this daily. He finds his feet too wet, he can put a plastic bag over his legs while he is in the pumps. Otherwise I think will be in a vicious circle. We are using silver alginate to the major area on his left posterior calf 01/02/17; the patient's posterior left leg has further of all into 3 open wounds. All of them covered with a necrotic surface. He claims to be using his compression pumps once a day. His edema control is marginal. Continue with silver alginate 01/10/17; the patient's left posterior leg actually looks somewhat better. There is less edema, less erythema. Still has 3 open areas covered with a necrotic surface requiring debridement. He claims to be using his compression pumps once a day his edema control is better 01/17/17; the patient's left posterior calf look better last week when I saw him and his wrap was changed 2 days ago. He has noted increasing pain in the left heel and arrives today with much larger wounds extensive erythema extending down into the entire heel area especially tender medially. He is not systemically unwell CBGs  have been controlled no fever. Our intake nurse showed me limegreen drainage on his AVD pads. 01/24/17; his usual this patient responds nicely to antibiotics last week giving him Levaquin for presumed Pseudomonas. The whole entire posterior part of his leg is much better much less inflamed and in the case of his Achilles heel area much less tender. He has also had some epithelialization posteriorly there are still open areas here and still draining but overall considerably better 01/31/17- He has continue to tolerate the compression wraps. he states that he continues to use the lymphedema pumps daily, and can increase to twice daily on the weekends. He is voicing no complaints or concerns regarding his LLE ulcers 02/07/17-he is here for follow-up evaluation. He states that he noted some erythema to the left medial and anterior thigh, which he states is new as of yesterday. He is concerned about recurrent cellulitis. He states his blood sugars have been slightly elevated, this morning in the 180s 02/14/17; he is here for follow-up evaluation. When he was last here there was erythema superiorly from his posterior wound in his anterior thigh. He was prescribed Levaquin however a culture of the wound surface grew MRSA over the phone I changed him to doxycycline on Monday and things seem to be a  lot better. 02/24/17; patient missed his appointment on Friday therefore we changed his nurse visit into a physician visit today. Still using silver alginate on the large area of the posterior left thigh. He isn't new area on the dorsal left second toe 03/03/17; actually better today although he admits he has not used his external compression pumps in the last 2 days or so because of work responsibilities over the weekend. 03/10/17; continued improvement. External compression pumps once a day almost all of his wounds have closed on the posterior left calf. Better edema control 03/17/17; in general improved. He still  has 3 small open areas on the lateral aspect of his left leg however most of the area on the posterior part of his leg is epithelialized. He has better edema control. He has an ABD pad under his stocking on the right anterior lower leg although he did not let us look at that today. 03/24/17; patient arrives back in clinic today with no open areas however there are areas on the posterior left calf and anterior left calf that are less than 100% epithelialized. His edema is well controlled in the left lower leg. There is some pitting edema probably lymphedema in the left upper thigh. He uses compression pumps at home once per day. I tried to get him to do this twice a day although he is very reticent. 04/01/2017 -- for the last 2 days he's had significant redness, tenderness and weeping and came in for an urgent visit today. 04/07/17; patient still has 6 more days of doxycycline. He was seen by Dr. Meyer Russel last Wednesday for cellulitis involving the posterior aspect, lateral aspect of his Involving his heel. For the most part he is better there is less erythema and less weeping. He has been on his feet for 12 hours 2 over the weekend. Using his compression pumps once a day 04/14/17 arrives today with continued improvement. Only one area on the posterior left calf that is not fully epithelialized. He has intense bilateral venous inflammation associated with his chronic venous insufficiency disease and secondary lymphedema. We have been using silver alginate to the left posterior calf wound In passing he tells [place] today that the right leg but we have not seen in quite some time has an open area on it but he doesn't want [place] to look at this today states he will show this to [place] next week. 04/21/17; there is no open area on his left leg although he still reports some weeping edema. He showed [place] his right leg today which is the first time we've seen this leg in a long time. He has a large area of open wound on the  right leg anteriorly healthy granulation. Quite a bit of swelling in the right leg and some degree of venous inflammation. He told us about the right leg in passing last week but states that deterioration in the right leg really only happened over the weekend 04/28/17; there is no open area on the left leg although there is an irritated part on the posterior which is like a wrap injury. The wound on the right leg which was new from last week at least to [place] is a lot better. 05/05/17; still no open area on the left leg. Patient is using his new compression stocking which seems to be doing a good job of controlling the edema. He states he is using his compression pumps once per day. The right leg still has an open wound although it  is better in terms of surface area. Required debridement. A lot of pain in the posterior right Achilles marked tenderness. Usually this type of presentation this patient gives concern for an active cellulitis 05/12/17; patient arrives today with his major wound from last week on the right lateral leg somewhat better. Still requiring debridement. He was using his compression stocking on the left leg however that is reopened with superficial wounds anteriorly he did not have an open wound on this leg previously. He is still using his juxta light's once daily at night. He cannot find the time to do this in the morning as he has to be at work by 7 AM 05/19/17; right lateral leg wound looks improved. No debridement required. The concerning area is on the left posterior leg which appears to almost have a subcutaneous hemorrhagic component to it. We've been using silver alginate to all the wounds 05/26/17; the right lateral leg wound continues to look improved. However the area on the left posterior calf is a tightly adherent surface. [place] using silver alginate. Because of the weeping edema in his legs there is very little good alternatives. 06/02/17; the patient left here last week  looking quite good. Major wound on the left posterior calf and a small one on the right lateral calf. Both of these look satisfactory. He tells me that by Wednesday he had noted increased pain in the left leg and drainage. He called on Thursday and Friday to get an appointment here but we were blocked. He did not go to urgent care or his primary physician. He thinks he had a fever on Thursday but did not actually take his temperature. He has not been using his compression pumps on the left leg because of pain. I advised him to go to the emergency room today for IV antibiotics for stents of left leg cellulitis but he has refused I have asked him to take 2 days off work to keep his leg elevated and he has refused this as well. In view of this I'm going to call him and Augmentin and doxycycline. 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 However after a detailed discussion regarding the risk benefits and alternatives he refuses to get admitted to the hospital. With no other recourse I will continue him on oral antibiotics as before and hopefully he'll have his infectious disease consultation this week. 06/16/2017 -- the patient was seen today by the nurse practitioner at infectious disease Ms. Dixon. 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 He has also been advise Lotrimin cream locally. she also had wise IV antibiotics for his condition if it flares up 06/23/17; patient arrives today with drainage bilaterally although the remaining wound on the left posterior calf after cleaning up  today "highlighter yellow drainage" did not look too bad. Unfortunately he has had breakdown on the right anterior leg [previously this leg had not been open and he is using a black stocking] he went to see infectious disease and is been put on clindamycin 150 mg daily, I did not verify the  dose although I'm not familiar with using clindamycin in this dosing range, perhaps for prophylaxisoo 06/27/17; I brought this patient back today to follow-up on the wound deterioration on the right lower leg together with surrounding cellulitis. I started him on doxycycline 4 days ago. This area looks better however he comes in today with intense cellulitis on the medial part of his left thigh. This is not have a wound in this area. Extremely tender. We've been using silver alginate to the wounds on the right lower leg left lower leg with bilateral 4 layer compression he is using his external compression pumps once a day 07/04/17; patient's left medial thigh cellulitis looks better. He has not been using his compression pumps as his insert said it was contraindicated with cellulitis. His right leg continues to make improvements all the wounds are still open. We only have one remaining wound on the left posterior calf. Using silver alginate to all open areas. He is on doxycycline which I started a week ago and should be finishing I gave him Augmentin after Thursday's visit for the severe cellulitis on the left medial thigh which fortunately looks better 07/14/17; the patient's left medial thigh cellulitis has resolved. The cellulitis in his right lower calf on the right also looks better. All of his wounds are stable to improved we've been using silver alginate he has completed the antibiotics I have given him. He has clindamycin 150 mg once a day prescribed by infectious disease for prophylaxis, I've advised him to start this now. We have been using bilateral Unna boots over silver alginate to the wound  areas 07/21/17; the patient is been to see infectious disease who noted his recurrent problems with cellulitis. He was not able to tolerate prophylactic clindamycin therefore he is on amoxicillin 500 twice a day. He also had a second daily dose of Lasix added By Dr. Melford Aase but he is not taking this. Nor is he being completely compliant with his compression pumps a especially not this week. He has 2 remaining wounds one on the right posterior lateral lower leg and one on the left posterior medial lower leg. 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 The patient has Unna boots bilaterally. 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 He has bilateral Unna boots for compression and silver alginate to his wounds. Arrives today with his legs looking as good as I have seen him in quite some time. Not surprisingly his wounds look better as well with improvement on the right lateral leg venous insufficiency wound and also the left medial leg. He is still using the compression pumps once a day 08/11/17; both legs appear to be doing better wounds on the right lateral and left medial legs look better. Skin on the right leg quite good. He is been using silver alginate as the primary dressing. I'm going to use Anasept gel calcium alginate and maintain all the secondary dressings 08/18/17; the patient continues to actually do quite well. The area on his right lateral leg is just about closed the left medial also looks better although it is still moist in this area. His edema is well controlled we have been using Anasept gel with calcium alginate and the usual secondary dressings, 4 layer compression and once daily use of his compression  pumps "always been able to manage 09/01/17; the  patient continues to do reasonably well in spite of his trip to [place]. The area on the right lateral leg is epithelialized. Left is much better but still open. He has more edema and more chronic erythema on the left leg [venous inflammation] 09/08/17; he arrives today with no open wound on the right lateral leg and decently controlled edema. Unfortunately his left leg is not nearly as in his good situation as last week.he apparently had increasing edema starting on Saturday. He edema soaked through into his foot so used a plastic bag to walk around his home. The area on the medial right leg which was his open area is about the same however he has lost surface epithelium on the left lateral which is new and he has significant pain in the Achilles area of the left foot. He is already on amoxicillin chronically for prophylaxis of cellulitis in the left leg 09/15/17; he is completed a week of doxycycline and the cellulitis in the left posterior leg and Achilles area is as usual improved. He still has a lot of edema and fluid soaking through his dressings. There is no open wound on the right leg. He saw infectious disease NP today 09/22/17;As usual 1 we transition him from our compression wraps to his stockings things did not go well. He has several small open areas on the right leg. He states this was caused by the compression wrap on his skin although he did not wear this with the stockings over them. He has several superficial areas on the left leg medially laterally posteriorly. He does not have any evidence of active cellulitis especially involving the left Achilles The patient is traveling from [facility] Saturday going to [facility]. He states he isn't attempting to get an appointment with a heel objects wound center there to change his dressings. I am not completely certain whether this will work 10/06/17; the patient came in on Friday for a nurse visit and the nurse reported that his legs  actually look quite good. He arrives in clinic today for his regular follow-up visit. He has a new wound on his left third toe over the PIP probably caused by friction with his footwear. He has small areas on the left leg and a very superficial but epithelialized area on the right anterior lateral lower leg. Other than that his legs look as good as I've seen him in quite some time. We have been using silver alginate Review of systems; no chest pain no shortness of breath other than this a 10 point review of systems negative 10/20/17; seen by Dr. Meyer Russel last week. He had taken some antibiotics [doxycycline] that he had left over. Dr. Meyer Russel thought he had candida infection and declined to give him further antibiotics. He has a small wound remaining on the right lateral leg several areas on the left leg including a larger area on the left posterior several left medial and anterior and a small wound on the left lateral. The area on the left dorsal third toe looks a lot better. ROS; Gen.; no fever, respiratory no cough no sputum Cardiac no chest pain other than this 10 point review of system is negative 10/30/17; patient arrives today having fallen in the bathtub 3 days ago. It took him a while to get up. He has pain and maceration in the wounds on his left leg which have deteriorated. He has not been using his pumps he also has some  maceration on the right lateral leg. 11/03/17; patient continues to have weeping edema especially in the left leg. This saturates his dressings which were just put on on 12/27. As usual the doxycycline seems to take care of the cellulitis on his lower leg. He is not complaining of fever, chills, or other systemic symptoms. He states his leg feels a lot better on the doxycycline I gave him empirically. He also apparently gets injections at his primary doctor's officeo Rocephin for cellulitis prophylaxis. I didn't ask him about his compression pump compliance today I think  that's probably marginal. Arrives in the clinic with all of his dressings primary and secondary macerated full of fluid and he has bilateral edema 11/10/17; the patient's right leg looks some better although there is still a cluster of wounds on the right lateral. The left leg is inflamed with almost circumferential skin loss medially to laterally although we are still maintaining anteriorly. He does not have overt cellulitis there is a lot of drainage. He is not using compression pumps. We have been using silver alginate to the wound areas, there are not a lot of options here 11/17/17; the patient's right leg continues to be stable although there is still open wounds, better than last week. The inflammation in the left leg is better. Still loss of surface layer epithelium especially posteriorly. There is no overt cellulitis in the amount of edema and his left leg is really quite good, tells me he is using his compression pumps once a day. 11/24/17; patient's right leg has a small superficial wound laterally this continues to improve. The inflammation in the left leg is still improving however we have continuous surface layer epithelial loss posteriorly. There is no overt cellulitis in the amount of edema in both legs is really quite good. He states he is using his compression pumps on the left leg once a day for 5 out of 7 days 12/01/17; very small superficial areas on the right lateral leg continue to improve. Edema control in both legs is better today. He has continued loss of surface epithelialization and left posterior calf although I think this is better. We have been using silver alginate with large number of absorptive secondary dressings 4 layer on the left Unna boot on the right at his request. He tells me he is using his compression pumps once a day 12/08/17; he has no open area on the right leg is edema control is good here. On the left leg however he has marked erythema and tenderness  breakdown of skin. He has what appears to be a wrap injury just distal to the popliteal fossa. 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 There was swelling erythema and breakdown in the right leg. He has much worse skin breakdown in the left leg as well multiple open areas medially and posteriorly as well as laterally. He tells me he has been using his compression pumps but tells me he feels that the drainage out of his leg is worse when he uses a compression pumps. To be fair to him he is been saying this for a while however I don't know that I have really been listening to this. I wonder if the compression pumps are  working properly 12/22/17;. Once again he arrives with severe erythema, weeping edema from the left greater than right leg. Noncompliance with compression pumps. New this visit he is complaining of pain on the lateral aspect of the right leg and the medial aspect of his right thigh. He apparently saw his cardiologist Dr. [name] who was ordered an echocardiogram area and I think this is a step in the right direction 12/25/17; started his doxycycline Monday night. There is still intense erythema of the right leg especially in the anterior thigh although there is less tenderness. The erythema around the wound on the right lateral calf also is less tender. He still complaining of pain in the left heel. His wounds are about the same right lateral left medial left lateral. Superficial but certainly not close to closure. He denies being systemically unwell no fever chills no abdominal pain no diarrhea 12/29/17; back in follow-up of his extensive right calf and right thigh cellulitis. I added amoxicillin to cover  possible doxycycline resistant strep. This seems to of done the trick he is in much less pain there is much less erythema and swelling. He has his echocardiogram at 11:00 this morning. X-ray of the left heel was also negative. 01/05/18; the patient arrived with his edema under much better control. Now that he is retired he is able to use his compression pumps daily and sometimes twice a day per the patient. He has a wound on the right leg the lateral wound looks better. Area on the left leg also looks a lot better. He has no evidence of cellulitis in his bilateral thighs I had a quick peak at his echocardiogram. He is in normal ejection fraction and normal left ventricular function. He has moderate pulmonary hypertension moderately reduced right ventricular function. One would have to wonder about chronic sleep apnea although he says he doesn't snore. He'll review the echocardiogram with his cardiologist. 01/12/18; the patient arrives with the edema in both legs under exemplary control. He is using his compression pumps daily and sometimes twice daily. His wound on the right lateral leg is just about closed. He still has some weeping areas on the posterior left calf and lateral left calf although everything is just about closed here as well. I have spoken with Aldean Baker who is the patient's nurse practitioner and infectious disease. She was concerned that the patient had not understood that the parenteral penicillin injections he was receiving for cellulitis prophylaxis was actually benefiting him. I don't think the patient actually saw that I would tend to agree we were certainly dealing with less infections although he had a serious one last month. 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 He is healed. He'll be placed in juxtalite compression wraps and increase his lymphedema pumps to twice daily. We will follow up again next week to ensure there are no  issues with the new regiment. 01/20/18-he is here for evaluation of bilateral lower extremity weeping edema. Yesterday he was placed in compression wrap to the right lower extremity and compression stocking to left lower shrubbery. He states he uses lymphedema pumps last night and again this morning and noted a blister to the left lower extremity. On exam he was noted to have drainage to the right lower extremity. He will be placed in Unna boots bilaterally and follow-up next week 01/26/18; patient was actually discharged a week ago to his own juxta light stockings only to return the next day with bilateral lower  extremity weeping edema.he was placed in bilateral Unna boots. He arrives today with pain in the back of his left leg. There is no open area on the right leg however there is a linear/wrap injury on the left leg and weeping edema on the left leg posteriorly. I spoke with infectious disease about 10 days ago. They were disappointed that the patient elected to discontinue prophylactic intramuscular penicillin shots as they felt it was particularly beneficial in reducing the frequency of his cellulitis. I discussed this with the patient today. He does not share this view. He'll definitely need antibiotics today. Finally he is traveling to [place] and trauma leaving this Saturday and returning a week later and he does not travel with his pumps. He is going by car 01/30/18; patient was seen 4 days ago and brought back in today for review of cellulitis in the left leg posteriorly. I put him on amoxicillin this really hasn't helped as much as I might like. He is also worried because he is traveling to [facility] trauma by car. Finally we will be rewrapping him. There is no open area on the right leg over his left leg has multiple weeping areas as usual 02/09/18; The same wrap on for 10 days. He did not pick up the last doxycycline I prescribed for him. He apparently took 4 days worth he already had.  There is nothing open on his right leg and the edema control is really quite good. He's had damage in the left leg medially and laterally especially probably related to the prolonged use of Unna boots 02/12/18; the patient arrived in clinic today for a nurse visit/wrap change. He complained of a lot of pain in the left posterior calf. He is taking doxycycline that I previously prescribed for him. Unfortunately even though he used his stockings and apparently used to compression pumps twice a day he has weeping edema coming out of the lateral part of his right leg. This is coming from the lower anterior lateral skin area. 02/16/18; the patient has finished his doxycycline and will finish the amoxicillin 2 days. The area of cellulitis in the left calf posteriorly has resolved. He is no longer having any pain. He tells me he is using his compression pumps at least once a day sometimes twice. 02/23/18; the patient finished his doxycycline and Amoxil last week. On Friday he noticed a small erythematous circle about the size of a quarter on the left lower leg just above his ankle. This rapidly expanded and he now has erythema on the lateral and posterior part of the thigh. This is bright red. Also has an area on the dorsal foot just above his toes and a tender area just below the left popliteal fossa. He came off his prophylactic penicillin injections at his own insistence one or 2 months ago. This is obviously deteriorated since then 03/02/18; patient is on doxycycline and Amoxil. Culture I did last week of the weeping area on the back of his left calf grew group B strep. I have therefore renewed the amoxicillin 500 3 times a day for a further week. He has not been systemically unwell. Still complaining of an area of discomfort right under his left popliteal fossa. There is no open wound on the right leg. He tells me that he is using his pumps twice a day on most days 03/09/18; patient arrives in clinic  today completing his amoxicillin today. The cellulitis on his left leg is better. Furthermore he tells me that  he had intramuscular penicillin shots that his primary care office today. However he also states that the wrap on his right leg fell down shortly after leaving clinic last week. He developed a large blister that was present when he came in for a nurse visit later in the week and then he developed intense discomfort around this area.He tells me he is using his compression pumps 03/16/18; the patient has completed his doxycycline. The infectious part of this/cellulitis in the left heel area left popliteal area is a lot better. He has 2 open areas on the right calf. Still areas on the left calf but this is a lot better as well. 03/24/18; the patient arrives complaining of pain in the left popliteal area again. He thinks some of this is wrap injury. He has no open area on the right leg and really no open area on the left calf either except for the popliteal area. He claims to be compliant with the compression pumps 03/31/18; I gave him doxycycline last week because of cellulitis in the left popliteal area. This is a lot better although the surface epithelium is denuded off and response to this. He arrives today with uncontrolled edema in the right calf area as well as a fingernail injury in the right lateral calf. There is only a few open areas on the left 04/06/18; I gave him amoxicillin doxycycline over the last 2 [name] that the amoxicillin should be completing currently. He is not complaining of any pain or systemic symptoms. The only open areas see has is on the right lateral lower leg paradoxically I cannot see anything on the left lower leg. He tells me he is using his compression pumps twice a day on most days. Silver alginate to the wounds that are open under 4 layer compression 04/13/18; he completed antibiotics and has no new complaints. Using his compression pumps. Silver alginate  that anything that's opened 04/20/18; he is using his compression pumps religiously. Silver alginate 4 layer compression anything that's opened. He comes in today with no open wounds on the left leg but 3 on the right including a new one posteriorly. He has 2 on the right lateral and one on the right posterior. He likes Unna boots on the right leg for reasons that aren't really clear we had the usual 4 layer compression on the left. It may be necessary to move to the 4 layer compression on the right however for now I left them in the Unna boots 04/27/18; he is using his compression pumps at least once a day. He has still the wounds on the right lateral calf. The area right posteriorly has closed. He does not have an open wound on the left under 4 layer compression however on the dorsal left foot just proximal to the toes and the left third toe 2 small open areas were identified 05/11/18; he has not uses compression pumps. The areas on the right lateral calf have coalesced into one large wound necrotic surface. On the left side he has one small wound anteriorly however the edema is now weeping out of a large part of his left leg. He says he wasn't using his pumps because of the weeping fluid. I explained to him that this is the time he needs to pump more 05/18/18; patient states he is using his compression pumps twice a day. The area on the right lateral large wound albeit superficial. On the left side he has innumerable number of small new wounds on  the left calf particularly laterally but several anteriorly and medially. All these appear to have healthy granulated base these look like the remnants of blisters however they occurred under compression. The patient arrives in clinic today with his legs somewhat better. There is certainly less edema, less multiple open areas on the left calf and the right anterior leg looks somewhat better as well superficial and a little smaller. However he relates pain  and erythema over the last 3-4 days in the thigh and I looked at this today. He has not been systemically unwell no fever no chills no change in blood sugar values 05/25/18; comes in today in a better state. The severe cellulitis on his left leg seems better with the Keflex. Not as tender. He has not been systemically unwell Hard to find an open wound on the left lower leg using his compression pumps twice a day The confluent wounds on his right lateral calf somewhat better looking. These will ultimately need debridement I didn't do this today. 06/01/18; the severe cellulitis on the left anterior thigh has resolved and he is completed his Keflex. There is no open wound on the left leg however there is a superficial excoriation at the base of the third toe dorsally. Skin on the bottom of his left foot is macerated looking. The left the wounds on the lateral right leg actually looks some better although he did require debridement of the top half of this wound area with an open curet 06/09/18 on evaluation today patient appears to be doing poorly in regard to his right lower extremity in particular this appears to likely be infected he has very thick purulent discharge along with a bright green tent to the discharge. This makes me concerned about the possibility of pseudomonas. He's also having increased discomfort at this point on evaluation. Fortunately there does not appear to be any evidence of infection spreading to the other location at this time. 06/16/18 on evaluation today patient appears to actually be doing fairly well. His ulcer has actually diminished in size quite significantly at this point which is good news. Nonetheless he still does have some evidence of infection he did see infectious disease this morning before coming here for his appointment. I did review the results of their evaluation and their note today. They did actually have him discontinue the Cipro and initiate treatment with  linezolid at this time. He is doing this for the next seven days and they recommended a follow-up in four months with them. He is the keep a log of the need for intermittent antibiotic therapy between now and when he falls back up with infectious disease. This will help them gaze what exactly they need to do to try and help them out. 06/23/18; the patient arrives today with no open wounds on the left leg and left third toe healed. He is been using his compression pumps twice a day. On the right lateral leg he still has a sizable wound but this is a lot better than last time I saw this. 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 Has been using silver alginate under 4 layer compression 06/30/18; the only open wound he has is on the right lateral leg and this looks healthy. No debridement is required. We have been using silver alginate. He does not have an open wound on the left leg. There is apparently some drainage  from the dorsal proximal third toe on the left although I see no open wound here. 07/03/18 on evaluation today patient was actually here just for a nurse visit rapid change. However when he was here on Wednesday for his rat change due to having been healed on the left and then developing blisters we initiated the wrap again knowing that he would be back today for [place] to reevaluate and see were at. Unfortunately he has developed some cellulitis into the proximal portion of his right lower extremity even into the region of his thigh. 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 He was placed on one as what at that point. Nonetheless he is done with that and has been tolerating it well otherwise. Doxycycline which in the past really did not seem to be effective for him. Nonetheless I think the best option may be for [place] to definitely reinitiate the  antibiotics for a longer period of time. 07/07/18; since I last saw this patient a week ago he has had a difficult time. At that point he did not have an open wound on his left leg. We transitioned him into juxta light stockings. He was apparently in the clinic the next day with blisters on the left lateral and left medial lower calf. He also had weeping edema fluid. He was put back into a compression wrap. He was also in the clinic on Friday with intense erythema in his right thigh. Per the patient he was started on Bactrim however that didn't work at all in terms of relieving his pain and swelling. He has taken 3 doxycycline that he had left over from last time and that seems to of helped. He has blistering on the right thigh as well. 07/14/18; the erythema on his right thigh has gotten better with doxycycline that he is finishing. 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 Presumably this cellulitis in the thigh was not related to that although I think this is a bit concerning going forward. He still has an area on the right lateral calf the blister on the right medial calf just below the knee that was discussed above. On the left 2 small open areas left medial and left lateral. Edema control is adequate. He is using his compression pumps twice a day 07/20/18; continued improvement in the condition of both legs especially the edema in his bilateral thighs. He tells me he is been losing weight through a combination of diet and exercise. He is using his compression pumps twice a day. So overall she made to the remaining wounds 07/27/2018; continued improvement in condition of both legs. His edema is well controlled. The area on the right lateral leg is just about closed he had one blisters show up on the medial left upper calf. We have him in 4 layer compression. He is going on a 10-day trip to [place], [place] and  [place]. He will be driving. He wants to wear Unna boots because of the lessening amount of constriction. He will not use compression pumps while he is away 08/05/18 on evaluation today patient actually appears to be doing decently well all things considered in regard to his bilateral lower extremities. The worst ulcer is actually only posterior aspect of his left lower extremity with a four layer compression wrap cut into his leg a couple [name] back. He did have  a trip and actually had [employer] wraps for the trip that he is worn since he was last here. Nonetheless he feels like the [employer] wraps actually do better for him his swelling is up a little bit but he also with his trip was not taking his Lasix on a regular set schedule like he was supposed to be. He states that obviously the reason being that he cannot drive and keep going without having to urinate too frequently which makes it difficult. He did not have his pumps with him while he was away either which I think also maybe playing a role here too. 08/13/2018; the patient only has a small open wound on the right lateral calf which is a big improvement in the last month or 2. He also has the area posteriorly just below the posterior fossa on the left which I think was a wrap injury from several [name] ago. He has no current evidence of cellulitis. He tells me he is back into his compression pumps twice a day. He also tells me that while he was at the laundromat somebody stole a section of his extremitease stockings 08/20/2018; back in the clinic with a much improved state. He only has small areas on the right lateral mid calf which is just about healed. This was is more substantial area for quite a prolonged period of time. He has a small open area on the left anterior tibia. The area on the posterior calf just below the popliteal fossa is closed today. He is using his compression pumps twice a day 08/28/2018; patient has no open wound  on the right leg. He has a smattering of open areas on the calf with some weeping lymphedema. More problematically than that it looks as though his wraps of slipped down in his usual he has very angry upper area of edema just below the right medial knee and on the right lateral calf. He has no open area on his feet. [place] I will send him in an antibiotic. We will continue to wrap the right leg. We ordered extremitease stockings for him last week and I plan to transition the right leg to a stocking when he gets home which will be in 10 days time. As usual he is very reluctant to take his pumps with him when he travels 09/07/2018; [place] He shows me a picture of his left leg in the mid part of his trip last week with intense fire engine erythema. The picture look bad enough I would have considered sending him to the hospital. Instead he went [place] They did not prescribe him antibiotics but he did take some doxycycline he had leftover from a previous visit. I had given him trimethoprim sulfamethoxazole before he left this did not work according to the patient. This is resulted in some improvement fortunately. He comes back with a large wound on the left posterior calf. Smaller area on the left anterior tibia. Denuded blisters on the dorsal left foot over his toes. Does not have much in the way of wounds on the right leg although he does have a very tender area on the right posterior area just below the popliteal fossa also suggestive of infection. He promises me he is back on his pumps twice a day 09/15/2018; the intense cellulitis in his left lower calf is a lot better. The wound area on  the posterior left calf is also so better. However he has reasonably extensive wounds on the dorsal aspect of his second and third toes and the proximal foot just at the base of the toes. There is  nothing open on the right leg 09/22/2018; the patient has excellent edema control in his legs bilaterally. He is using his external compression pumps twice a day. He has no open area on the right leg and only the areas in the left foot dorsally second and third toe area on the left side. He does not have any signs of active cellulitis. 10/06/2018; the patient has good edema control bilaterally. He has no open wound on the right leg. There is a blister in the posterior aspect of his left calf that we had to deal with today. He is using his compression pumps twice a day. There is no signs of active cellulitis. We have been using silver alginate to the wound areas. He still has vulnerable areas on the base of his left first second toes dorsally He has a his extremities stockings and we are going to transition him today into the stocking on the right leg. He is cautioned that he will need to continue to use the compression pumps twice a day. If he notices uncontrolled edema in the right leg he may need to go to 3 times a day. 10/13/2018; the patient came in for a nurse check on Friday he has a large flaccid blister on the right medial calf just below the knee. We unroofed this. He has this and a new area underneath the posterior mid calf which was undoubtedly a blister as well. He also has several small areas on the right which is the area we put his extremities stocking on. 10/19/2018; the patient went to see infectious disease this morning I am not sure if that was a routine follow-up in any case the doxycycline I had given him was discontinued and started on linezolid. He has not started this. It is easy to look at his left calf and the inflammation and think this is cellulitis however he is very tender in the tissue just below the popliteal fossa and I have no doubt that there is infection going on here. He states the problem he is having is that with the compression pumps the edema goes down and  then starts walking the wrap falls down. We will see if we can adhere this. He has 1 or 2 minuscule open areas on the right still areas that are weeping on the posterior left calf, the base of his left second and third toes 10/26/18; back today in clinic with quite of skin breakdown in his left anterior leg. This may have been infection the area below the popliteal fossa seems a lot better however tremendous epithelial loss on the left anterior mid tibia area over quite inexpensive tissue. He has 2 blisters on the right side but no other open wound here. 10/29/2018; came in urgently to see [place] today and we worked him in for review. He states that the 4 layer compression on the right leg caused pain he had to cut it down to roughly his mid calf this caused swelling above the wrap and he has blisters and skin breakdown today. As a result of the pain he has not been using his pumps. Both legs are a lot more edematous and there is a lot of weeping fluid. 11/02/18; arrives in clinic with continued difficulties in the right leg> left.  Leg is swollen and painful. multiple skin blisters and new open areas especially laterally. He has not been using his pumps on the right leg. 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 He is not systemically unwell. 11/09/2018; the patient claims he is being compliant with his pumps. He is finished the doxycycline I gave him last week. Culture I did of the wound on the right lateral leg showed a few very resistant methicillin staph aureus. This was resistant to doxycycline. Nevertheless he states the pain in the leg is a lot better which makes me wonder if the cultured organism was not really what was causing the problem nevertheless this is a very dangerous organism to be culturing out of any wound. His right leg is still a lot larger than the left. He is using an Radio broadcast assistant on this area, he blames a 4-layer compression for  causing the original skin breakdown which I doubt is true however I cannot talk him out of it. We have been using silver alginate to all of these areas which were initially blisters 11/16/2018; patient is being compliant with his external compression pumps at twice a day. Miraculously he arrives in clinic today with absolutely no open wounds. He has better edema control on the left where he has been using 4 layer compression versus wound of wounds on the right and I pointed this out to him. There is no inflammation in the skin in his lower legs which is also somewhat unusual for him. There is no open wounds on the dorsal left foot. He has extremitease stockings at home and I have asked him to bring these in next week. 11/25/18 patient's lower extremity on examination today on the left appears for the most part to be wound free. He does have an open wound on the lateral aspect of the right lower extremity but this is minimal compared to what I've seen in past. He does request that we go ahead and wrap the left leg as well even though there's nothing open just so hopefully it will not reopen in short order. 1/28; patient has superficial open wounds on the right lateral calf left anterior calf and left posterior calf. His edema control is adequate. 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 We have been using silver alginate as the primary dressing. He claims compliance with his compression pumps 2/4; patient has superficial open wounds on numerous areas of his left calf and again one on the left dorsal foot. The areas on the right lateral calf have healed. The cellulitis that I gave him doxycycline for last week is also resolved this was mostly on the left anterior calf just below the tibial tuberosity. His edema looks fairly well-controlled. He tells me he went to see his primary doctor today and had blood work  ordered 2/11; once again he has several open areas on the left calf left tibial area. Most of these are small and appear to have healthy granulation. He does not have anything open on the right. The edema and control in his thighs is pretty good which is usually a good indication he has been using his pumps as requested. 2/18; he continues to have several small areas on the left calf and left tibial area. Most of these are small healthy granulation. We put him in his stocking on the right leg last week and  he arrives with a superficial open area over the right upper tibia and a fairly large area on the right lateral tibia in similar condition. His edema control actually does not look too bad, he claims to be using his compression pumps twice a day 2/25. Continued small areas on the left calf and left tibial area. New areas especially on the right are identified just below the tibial tuberosity and on the right upper tibia itself. There are also areas of weeping edema fluid even without an obvious wound. He does not have a considerable degree of lymphedema but clearly there is more edema here than his skin can handle. He states he is using the pumps twice a day. We have an Unna boot on the right and 4 layer compression on the left. 3/3; he continues to have an area on the right lateral calf and right posterior calf just below the popliteal fossa. There is a fair amount of tenderness around the wound on the popliteal fossa but I did not see any evidence of cellulitis, could just be that the wrap came down and rubbed in this area. He does not have an open area on the left leg however there is an area on the left dorsal foot at the base of the third toe We have been using silver alginate to all wound areas 3/10; he did not have an open area on his left leg last time he was here a week ago. Today he arrives with a horizontal wound just below the tibial tuberosity and an area on the left lateral calf.  He has intense erythema and tenderness in this area. The area is on the right lateral calf and right posterior calf better than last week. We have been using silver alginate as usual 3/18 - Patient returns with 3 small open areas on left calf, and 1 small open area on right calf, the skin looks ok with no significant erythema, he continues the UNA boot on right and 4 layer compression on left. The right lateral calf wound is closed , the right posterior is small area. we will continue silver alginate to the areas. 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 He is been tolerating the dressing changes without complication. Fortunately she has made excellent improvement in regard to the overall status of his wounds. Unfortunately every time we cease wrapping him he ends up reopening in causing more significant issues at that point. Again I'm unsure of the best direction to take although I think the lymphedema clinic may be appropriate for him. 02/03/19 on evaluation today patient appears to be doing well in regard to the wounds that we saw him for last week unfortunately he has a new area on the proximal portion of his right medial/posterior lower extremity where the wrap somewhat slowed down and caused swelling and a blister to rub and open. Unfortunately this is the only opening that he has on either leg at this point. 02/17/19 on evaluation today patient's bilateral lower extremities appear to be doing well. He still completely healed in regard to the left lower extremity. In regard to the right lower extremity the area where the wrap and slid down and caused the blister still seems to be slightly open although this is dramatically better than during the last evaluation two [name]  ago. I'm very pleased with  the way this stands overall. 03/03/19 on evaluation today patient appears to be doing well in regard to his right lower extremity in general although he did have a new blister open this does not appear to be showing any evidence of active infection at this time. Fortunately there's No fevers, chills, nausea, or vomiting noted at this time. Overall I feel like he is making good progress it does feel like that the right leg will we perform the [employer] seems to do with a bit better than three layer wrap on the left which slid down on him. We may switch to doing bilateral in the book wraps. 5/4; I have not seen Mr. Johnson in quite some time. According to our case manager he did not have an open wound on his left leg last week. He had 1 remaining wound on the right posterior medial calf. He arrives today with multiple openings on the left leg probably were blisters and/or wrap injuries from Unna boots. I do not think the Unna boot's will provide adequate compression on the left. I am also not clear about the frequency he is using the compression pumps. 03/17/19 on evaluation today patient appears to be doing excellent in regard to his lower extremities compared to last week's evaluation apparently. He had gotten significantly worse last week which is unfortunate. The [employer] wrap on the left did not seem to do very well for him at all and in fact it didn't control his swelling significantly enough he had an additional outbreak. Subsequently we go back to the four layer compression wrap on the left. This is good news. At least in that he is doing better and the wound seem to be killing him. He still has not heard anything from the lymphedema clinic. 03/24/19 on evaluation today patient actually appears to be doing much better in regard to his bilateral lower Trinity as compared to last week when I saw him. Fortunately there's no signs of active infection at this time. He has been tolerating the  dressing changes without complication. Overall I'm extremely pleased with the progress and appearance in general. 04/07/19 on evaluation today patient appears to be doing well in regard to his bilateral lower extremities. His swelling is significantly down from where it was previous. With that being said he does have a couple blisters still open at this point but fortunately nothing that seems to be too severe and again the majority of the larger openings has healed at this time. 04/14/19 on evaluation today patient actually appears to be doing quite well in regard to his bilateral lower extremities in fact I'm not even sure there's anything significantly open at this time at any site. Nonetheless he did have some trouble with these wraps where they are somewhat irritating him secondary to the fact that he has noted that the graph wasn't too close down to the end of this foot in a little bit short as well up to his knee. Otherwise things seem to be doing quite well. 04/21/19 upon evaluation today patient's wound bed actually showed evidence of being completely healed in regard to both lower extremities which is excellent news. There does not appear to be any signs of active infection which is also good news. I'm very pleased in this regard. No fevers, chills, nausea, or vomiting noted at this time. 04/28/19 on evaluation today patient appears to be doing a little bit worse in regard to both lower extremities  on the left mainly due to the fact that when he went infection disease the wrap was not wrapped quite high enough he developed a blister above this. On the right he is a small open area of nothing too significant but again this is continuing to give him some trouble he has been were in the Velcro compression that he has at home. 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 He's been tolerating the dressing changes without complication.  Fortunately there's no signs of active infection at this time. No fevers, chills, nausea, or vomiting noted at this time. We have been trying to get an appointment with her lymphedema clinic in [facility] but unfortunately nobody can get them on phone with not been able to even fax information over the patient likewise is not been able to get in touch with them. Overall I'm not sure exactly what's going on here with to reach out again today. 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 Still having a lot of drainage unfortunately. He tells me especially in the left but even on the right. There's no signs of active infection which is good news we've been using so ratcheted up to this point. 05/19/19 on evaluation today patient actually appears to be doing quite well with regard to his left lower extremity which is great news. Fortunately in regard to the right lower extremity has an issues with his wrap and he subsequently did remove this from what I'm understanding. Nonetheless long story short is what he had rewrapped once he removed it subsequently had maggots underneath this wrap whenever he came in for evaluation today. With that being said they were obviously completely cleaned away by the nursing staff. The visit today which is excellent news. However he does appear to potentially have some infection around the right ankle region where the maggots were located as well. He will likely require anabiotic therapy today. 05/26/19 on evaluation today patient actually appears to be doing much better in regard to his bilateral lower extremities. I feel like the infection is under much better control. With that being said there were maggots noted when the wrap was removed yet again today. Again this could have potentially been left over from previous although at this time there does not appear to be any  signs of significant drainage there was obviously on the wrap some drainage as well this contracted gnats or otherwise. Either way I do not see anything that appears to be doing worse in my pinion and in fact I think his drainage has slowed down quite significantly likely mainly due to the fact to his infection being under better control. 06/02/2019 on evaluation today patient actually appears to be doing well with regard to his bilateral lower extremities there is no signs of active infection at this time which is great news. With that being said he does have several open areas more so on the right than the left but nonetheless these are all significantly better than previously noted. 06/09/2019 on evaluation today patient actually appears to be doing well. His wrap stayed up and he did not cause any problems he had more drainage on the right compared to the left but overall I do not see any major issues at this time which is great news. 06/16/2019 on evaluation today patient appears to be doing excellent with regard  to his lower extremities the only area that is open is a new blister that can have opened as of today on the medial ankle on the left. Other than this he really seems to be doing great I see no major issues at this point. 06/23/2019 on evaluation today patient appears to be doing quite well with regard to his bilateral lower extremities. In fact he actually appears to be almost completely healed there is a small area of weeping noted of the right lower extremity just above the ankle. Nonetheless fortunately there is no signs of active infection at this time which is good news. No fevers, chills, nausea, vomiting, or diarrhea. 8/24; the patient arrived for a nurse visit today but complained of very significant pain in the left leg and therefore I was asked to look at this. Noted that he did not have an open area on the left leg last week nevertheless this was wrapped. The patient states  that he is not been able to put his compression pumps on the left leg because of the discomfort. He has not been systemically unwell 06/30/2019 on evaluation today patient unfortunately despite being excellent last week is doing much worse with regard to his left lower extremity today. In fact he had to come in for a nurse on Monday where his left leg had to be rewrapped due to excessive weeping Dr. Leanord Hawking placed him on doxycycline at that point. Fortunately there is no signs of active infection Systemically at this time which is good news. 07/07/2019 in regard to the patient's wounds today he actually seems to be doing well with his right lower extremity there really is nothing open or draining at this point this is great news. Unfortunately the left lower extremity is given him additional trouble at this time. There does not appear to be any signs of active infection nonetheless he does have a lot of edema and swelling noted at this point as well as blistering all of which has led to a much more poor appearing leg at this time compared to where it was 2 [name] ago when it was almost completely healed. Obviously this is a little discouraging for the patient. He is try to contact the lymphedema clinic in [place] he has not been able to get through to them. 07/14/2019 on evaluation today patient actually appears to be doing slightly better with regard to his left lower extremity ulcers. Overall I do feel like at least at the top of the wrap that we have been placing this area has healed quite nicely and looks much better. The remainder of the leg is showing signs of improvement. Unfortunately in the thigh area he still has an open region on the left and again on the right he has been utilizing just a Band-Aid on an area that also opened on the thigh. Again this is an area that were not able to wrap although we did do an Ace wrap to provide some compression that something that obviously is a little  less effective than the compression wraps we have been using on the lower portion of the leg. He does have an appointment with the lymphedema clinic in [facility] on Friday. 07/21/2019 on evaluation today patient appears to be doing better with regard to his lower extremity ulcers. He has been tolerating the dressing changes without complication. Fortunately there is no signs of active infection at this time. No fevers, chills, nausea, vomiting, or diarrhea. I did receive the paperwork from the  physical therapist [place]. Subsequently I signed off on that this morning and sent that back to him for further progression with the treatment plan. 07/28/2019 on evaluation today patient appears to be doing very well with regard to his right lower extremity where I do not see any open wounds at this point. Fortunately he is feeling great as far as that is concerned as well. In regard to the left lower extremity he has been having issues with still several areas of weeping and edema although the upper leg is doing better his lower leg still I think is going require the compression wrap at this time. No fevers, chills, nausea, vomiting, or diarrhea. 08/04/2019 on evaluation today patient unfortunately is having new wounds on the right lower extremity. Again we have been using Unna boot wrap on that side. We switched him to using his juxta lite wrap at home. With that being said he tells me he has been using it although his legs extremely swollen and to be honest really does not appear that he has been. I cannot know that for sure however. Nonetheless he has multiple new wounds on the right lower extremity at this time. Obviously we will have to see about getting this rewrapped for him today. 08/11/2019 on evaluation today patient appears to be doing fairly well with regard to his wounds. He has been tolerating the dressing changes including the compression wraps without  complication. He still has a lot of edema in his upper thigh regions bilaterally he is supposed to be seeing the lymphedema clinic on the 15th of this month once his wraps arrive for the upper part of his legs. 08/18/2019 on evaluation today patient appears to be doing well with regard to his bilateral lower extremities at this point. He has been tolerating the dressing changes without complication. Fortunately there is no signs of active infection which is also good news. He does have a couple weeping areas on the first and second toe of the right foot he also has just a small area on the left foot upper leg and a small area on the left lower leg but overall he is doing quite well in my opinion. He is supposed to be getting his wraps shortly in fact tomorrow and then subsequently is seeing the lymphedema clinic next Wednesday on the 21st. Of note he is also leaving on the 25th to go on vacation for a [place] to the beach. For that reason and since there is some uncertainty about what there can be doing at lymphedema clinic next Wednesday I am get a make an appointment for next Friday here for us to see what we need to do for him prior to him leaving for vacation. 10/23; patient arrives in considerable pain predominantly in the upper posterior calf just distal to the popliteal fossa also in the wound anteriorly above the major wound. This is probably cellulitis and he has had this recurrently in the past. He has no open wound on the right side and he has had an Radio broadcast assistantUnna boot in that area. Finally I note that he has an area on the left posterior calf which by enlarge is mostly epithelialized. This protrudes beyond the borders of the surrounding skin in the setting of dry scaly skin and lymphedema. [place] Per his longstanding pattern, he will not take his compression pumps with him predominantly out of fear that they will be stolen. He  therefore asked that we put  a Unna boot back on the right leg. He will also contact the wound care center in [facility] to see if they can change his dressing in the mid week. 11/3; patient returned from his vacation to [facility]. He was seen on 1 occasion at their wound care center. They did a 2 layer compression system as they did not have our 4-layer wrap. I am not completely certain what they put on the wounds. They did not change the Unna boot on the right. The patient is also seeing a lymphedema specialist physical therapist in [place]. It appears that he has some compression sleeve for his thighs which indeed look quite a bit better than I am used to seeing. He pumps over these with his external compression pumps. 11/10; the patient has a new wound on the right medial thigh otherwise there is no open areas on the right. He has an area on the left leg posteriorly anteriorly and medially and an area over the left second toe. We have been using silver alginate. He thinks the injury on his thigh is secondary to friction from the compression sleeve he has. 11/17; the patient has a new wound on the right medial thigh last week. He thinks this is because he did not have a underlying stocking for his thigh juxta lite apparatus. He now has this. The area is fairly large and somewhat angry but I do not think he has underlying cellulitis. He has a intact blister on the right anterior tibial area. Small wound on the right great toe dorsally Small area on the medial left calf. 11/30; the patient does not have any open areas on his right leg and we did not take his juxta lite stocking off. However he states that on Friday his compression wrap fell down lodging around his upper mid calf area. As usual this creates a lot of problems for him. He called urgently today to be seen for a nurse visit however the nurse visit turned into a provider visit because of extreme erythema and pain in the left anterior tibia extending  laterally and posteriorly. The area that is problematic is extensive 10/06/2019 upon evaluation today patient actually appears to be doing poorly in regard to his left lower extremity. He Dr. Leanord Hawking did place him on doxycycline this past Monday apparently due to the fact that he was doing much worse in regard to this left leg. Fortunately the doxycycline does seem to be helping. Unfortunately we are still having a very difficult time getting his edema under any type of control in order to anticipate discharge at some point. The only way were really able to control his lymphedema really is with compression wraps and that has only even seemingly temporary. He has been seeing a lymphedema clinic they are trying to help in this regard but still this has been somewhat frustrating in general for the patient. 10/13/19 on evaluation today patient appears to be doing excellent with regard to his right lower extremity as far as the wounds are concerned. His swelling is still quite extensive unfortunately. He is still having a lot of drainage from the thigh areas bilaterally which is unfortunate. He's been going to lymphedema clinic but again he still really does not have this edema under control as far as his lower extremities are concern. With regard to his left lower extremity this seems to be improving and I do believe the doxycycline has been of benefit for him.  He is about to complete the doxycycline. 10/20/2019 on evaluation today patient appears to be doing poorly in regard to his bilateral lower extremities. More in the right thigh he has a lot of irritation at this site unfortunately. In regard to the left lower extremity the wrap was not quite as high it appears and does seem to have caused him some trouble as well. Fortunately there is no evidence of systemic infection though he does have some blue-green drainage which has me concerned for the possibility of Pseudomonas. He tells me he is  previously taking Cipro without complications and he really does not care for Levaquin however due to some of the side effects he has. He is not allergic to any medications specifically antibiotics that were aware of. 10/27/2019 on evaluation today patient actually does appear to be for the most part doing better when compared to last week's evaluation. With that being said he still has multiple open wounds over the bilateral lower extremities. He actually forgot to start taking the Cipro and states that he still has the whole bottle. He does have several new blisters on left lower extremity today I think I would recommend he go ahead and take the Cipro based on what I am seeing at this point. 12/30-Patient comes at 1 week visit, 4 layer compression wraps on the left and Unna boot on the right, primary dressing Xtrasorb and silver alginate. Patient is taking his Cipro and has a few more days left probably 5-6, and the legs are doing better. He states he is using his compressions devices which I believe he has 11/10/2019 on evaluation today patient actually appears to be much better than last time I saw him 2 [name] ago. His wounds are significantly improved and overall I am very pleased in this regard. Fortunately there is no signs of active infection at this time. He is just a couple of days away from completing Cipro. Overall his edema is much better he has been using his lymphedema pumps which I think is also helping at this point. 11/17/2019 on evaluation today patient appears to be doing excellent in regard to his wounds in general. His legs are swollen but not nearly as much as they have been in the past. Fortunately he is tolerating the compression wraps without complication. No fevers, chills, nausea, vomiting, or diarrhea. He does have some erythema however in the distal portion of his right lower extremity specifically around the forefoot and toes there is a little bit of warmth here as  well. 11/24/2019 on evaluation today patient appears to be doing well with regard to his right lower extremity I really do not see any open wounds at this point. His left lower extremity does have several open areas and his right medial thigh also is open. Other than this however overall the patient seems to be making good progress and I am very pleased at this point. 12/01/2019 on evaluation today patient appears to be doing poorly at this point in regard to his left lower extremity has several new blisters despite the fact that we have him in compression wraps. In fact he had a 4-layer compression wrap, his upper thigh wrapped from lymphedema clinic, and a juxta light over top of the 4 layer compression wrap the lymphedema clinic applied and despite all this he still develop blisters underneath. Obviously this does have me concerned about the fact that unfortunately despite what we are doing to try to get wounds healed he continues to  have new areas arise I do not think he is ever good to be at the point where he can realistically just use wraps at home to keep things under control. Typically when we heal him it takes about 1-2 days before he is back in the clinic with severe breakdown and blistering of his lower extremities bilaterally. This is happened numerous times in the past. Unfortunately I think that we may need some help as far as overall fluid overload to kind of limit what we are seeing and get things under better control. Electronic Signature(s) Signed: 12/01/2019 11:26:53 AM By: Lenda Kelp PA-C Entered By: Lenda Kelp on 12/01/2019 11:26:52 -------------------------------------------------------------------------------- Physical Exam Details Patient Name: Date of Service: ZARIF, RATHJE 12/01/2019 10:15 AM Medical Record [id number] Patient Account Number: [id number] Date of Birth/Sex: Treating RN: 18-May-1951 (69 y.o. M) Primary Care Provider: Nicoletta [name] Other  Clinician: Referring Provider: Treating Provider/Extender:[name], [name], [name] in Treatment: 201 Constitutional Well-nourished and well-hydrated in no acute distress. Respiratory normal breathing without difficulty. Psychiatric this patient is able to make decisions and demonstrates good insight into disease process. Alert and Oriented x 3. pleasant and cooperative. Notes Patient's wound bed currently showed signs again of healing at most locations although the new blistered areas obviously are likely open up in the new wounds unfortunately and this is good to be of concern as well. I do believe that the overall lymphedema is an issue but also even with a 4-layer compression wrap, a juxta light over top of this, and even using the upper thigh compression wraps the patient still has 3+ pitting edema on evaluation today. That is quite significant and I think is part of the big problem that we are seeing at this point. I did discuss with him the fact that weight loss could potentially help some with this but I am still not even sure that would necessarily resolve the issue in general. Electronic Signature(s) Signed: 12/01/2019 11:27:47 AM By: Lenda Kelp PA-C Entered By: Lenda Kelp on 12/01/2019 11:27:47 -------------------------------------------------------------------------------- Physician Orders Details Patient Name: Date of Service: RIORDAN, WALLE 12/01/2019 10:15 AM Medical Record [id number] Patient Account Number: [id number] Date of Birth/Sex: Treating RN: 1951/04/18 (69 y.o. 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 Dressing Change Frequency Do not change entire dressing for one week. - both lower legs Skin Barriers/Peri-Wound Care Moisturizing lotion TCA Cream or Ointment - to legs mixed with lotion Wound Cleansing May shower with protection. Primary Wound Dressing Wound #145R Right,Medial Upper Leg Calcium Alginate with Silver Wound #154 Left,Medial Lower Leg Calcium Alginate with Silver Wound #156 Left,Dorsal Foot Calcium Alginate with Silver Secondary Dressing Wound #145R Right,Medial Upper Leg ABD pad - secured with tape Wound #154 Left,Medial Lower Leg Dry Gauze ABD pad Wound #156 Left,Dorsal Foot Dry Gauze Edema Control 4 layer compression: Left lower extremity - unna boot at upper portion of lower leg. Roland Rack  Boot to Right Lower Extremity Avoid standing for long periods of time Elevate legs to the level of the heart or above for 30 minutes daily and/or when sitting, a frequency of: - throughout the day Exercise regularly Segmental Compressive Device. - lymphadema pumps 60 min 2 times per day Other: - lymphadema wraps to upper leg per lymphadema clinic Additional Orders / Instructions Other: - follow up with cardiology Dr. Marshell LevanHiltey or primary care MD for eval of chronic lower extremity 3+ pitting edema and fluid overload Consults Cardiology - Follow up with Dr. Marshell LevanHiltey for evaluation of chronic 3+ pitting edema in lower legs and possible fluid overload Electronic Signature(s) Signed: 12/01/2019 6:10:09 PM By: Lenda KelpStone [name], Anaiah Mcmannis PA-C Signed: 12/01/2019 6:17:53 PM By: Zenaida DeedBoehlein, Linda RN,  BSN Entered By: Zenaida DeedBoehlein, Linda on 12/01/2019 11:26:13 -------------------------------------------------------------------------------- Prescription 12/01/2019 Patient Name: Alphonse GuildOWPER, Egbert J. Provider: Lenda KelpStone [name], Pride Gonzales PA Date of Birth: 08/24/1951 NPI#: [id number] Sex: M DEA#: [id number] Phone #: [phone] License #: [facility] [address] [place] [address] [address] [place] [address] [phone] Allergies No Known Allergies Provider's Orders Cardiology - Follow up with Dr. Marshell LevanHiltey for evaluation of chronic 3+ pitting edema in lower legs and possible fluid overload Signature(s): Date(s): Electronic Signature(s) Signed: 12/01/2019 6:10:09 PM By: Lenda KelpStone [name], Sebrena Engh PA-C Signed: 12/01/2019 6:17:53 PM By: Zenaida DeedBoehlein, Linda RN, BSN Entered By: Zenaida DeedBoehlein, Linda on 12/01/2019 11:26:19 --------------------------------------------------------------------------------  Problem List Details Patient Name: Date of Service: Alphonse GuildCOWPER, Rolin J. 12/01/2019 10:15 AM Medical Record [id number] Patient Account Number: [id number] Date of Birth/Sex: Treating RN: 10/08/1951 (69 y.o. M) Primary Care Provider: Nicoletta BaMCGOWEN, Kevin Other Clinician: Referring Provider: Treating Provider/Extender:[name], Kevin CedarHoyt MCGOWEN, [name] in Treatment: 201 Active Problems ICD-10 Evaluated Encounter Code Description Active Date Today Diagnosis L97.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 right calf limited to 06/30/2018 No Yes breakdown of skin L97.221 Non-pressure chronic ulcer of left calf limited to 09/30/2016 No Yes breakdown of skin I87.333 Chronic venous hypertension (idiopathic) with ulcer 01/22/2016 No Yes and inflammation of bilateral lower extremity I89.0 Lymphedema, not elsewhere classified 01/22/2016 No Yes E11.622 Type 2 diabetes mellitus with other skin ulcer 01/22/2016 No Yes E11.40 Type 2 diabetes mellitus with  diabetic neuropathy, 01/22/2016 No Yes unspecified L03.116 Cellulitis of left lower limb 04/01/2017 No Yes Inactive Problems ICD-10 Code Description Active Date Inactive Date 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 06/30/2017 skin L97.521 Non-pressure chronic ulcer of other part of left foot limited to 04/27/2018 04/27/2018 breakdown of skin L03.115 Cellulitis of right lower limb 12/22/2017 12/22/2017 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 with other specified 06/30/2018 06/30/2018 severity L97.511 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 06/30/2018 06/30/2018 breakdown of skin Resolved Problems Electronic Signature(s) Signed: 12/01/2019 11:02:20 AM By: Lenda KelpStone [name], Caron Tardif PA-C Entered By: Lenda KelpStone [name], Burlene Montecalvo on 12/01/2019 11:02:18 -------------------------------------------------------------------------------- Progress Note Details Patient Name: Date of Service: Alphonse GuildCOWPER, Dontel J. 12/01/2019 10:15 AM Medical Record [id number] Patient Account Number: [id number] Date of Birth/Sex: Treating RN: 06/21/1951 (69 y.o. M) Primary Care Provider: Nicoletta BaMCGOWEN, Kevin Other Clinician: Referring Provider: Treating Provider/Extender:[name], Kevin CedarHoyt MCGOWEN, [name] in Treatment: 201 Subjective Chief Complaint Information obtained from Patient patient is here for evaluation venous/lymphedema weeping History of Present Illness (HPI) Referred by PCP for consultation. Patient has long standing history of BLE venous stasis, no prior ulcerations. At beginning of month, developed cellulitis and weeping. Received IM Rocephin followed by Keflex and resolved.  Wears compression stocking, appr 6 months old. Not sure strength. No present drainage. 01/22/16 this is a patient who is a type II diabetic on insulin. 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 He tells me he religiously wears pressure stockings of  uncertain strength. He was here with weeping edema about 8 months ago but did not have an open wound. Roughly a month ago he had a reopening on his bilateral legs. He is been using bandages and Neosporin. He does not complain of pain. 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 Last BUN/creatinine I have is from 11/20/15 at 13 and 1.0 respectively 01/29/16; patient arrives today having tolerated the Profore wrap. He brought in his stockings and these are 18 mmHg stockings he bought from [employer]. The compression here is likely inadequate. He does not complain of pain or excessive drainage she has no systemic symptoms. The wound on the right looks improved as does the one on the left although one on the left is more substantial with still tissue at risk below the actual wound area on the bilateral posterior calf 02/05/16; patient arrives with poor edema control. He states that we did put a 4 layer compression on it last week. No weight appear 5 this. 02/12/16; the area on the posterior right Has healed. The left Has a substantial wound that has necrotic surface eschar that requires a debridement with a curette. 02/16/16;the patient called or a Nurse visit secondary to increased swelling. He had been in earlier in the week with his right leg healed. He was transitioned to is on pressure stocking on the right leg with the only open wound on the left, a substantial area on the left posterior calf. Note he has a history of severe lower extremity edema, he has a history of chronic atrial fibrillation but not heart failure per my notes but I'll need to research this. He is not complaining of chest pain shortness of breath or orthopnea. The intake nurse noted blisters on the previously closed right leg 02/19/16; this is the patient's regular visit day. I see him on Friday with escalating edema new wounds  on the right leg and clear signs of at least right ventricular heart failure. I increased his Lasix to 40 twice a day. He is returning currently in follow-up. States he is noticed a decrease in that the edema 02/26/16 patient's legs have much less edema. There is nothing really open on the right leg. The left leg has improved condition of the large superficial wound on the posterior left leg 03/04/16; edema control is very much better. The patient's right leg wounds have healed. On the left leg he continues to have severe venous inflammation on the posterior aspect of the left leg. There is no tenderness and I don't think any of this is cellulitis. 03/11/16; patient's right leg is married healed and he is in his own stocking. The patient's left leg has deteriorated somewhat. There is a lot of erythema around the wound on the posterior left leg. There is also a significant rim of erythema posteriorly just above where the wrap would've ended there is a new wound in this location and a lot of tenderness. Can't rule out cellulitis in this area. 03/15/16; patient's right leg remains healed and he is in his own stocking. The patient's left leg is much better than last review. His  major wound on the posterior aspect of his left Is almost fully epithelialized. He has 3 small injuries from the wraps. Really. Erythema seems a lot better on antibiotics 03/18/16; right leg remains healed and he is in his own stocking. The patient's left leg is much better. The area on the posterior aspect of the left calf is fully epithelialized. His 3 small injuries which were wrap injuries on the left are improved only one seems still open his erythema has resolved 03/25/16; patient's right leg remains healed and he is in his own stocking. There is no open area today on the left leg posterior leg is completely closed up. His wrap injuries at the superior aspect of his leg are also resolved. He looks as though he has some  irritation on the dorsal ankle but this is fully epithelialized without evidence of infection. 03/28/16; we discharged this patient on Monday. Transitioned him into his own stocking. There were problems almost immediately with uncontrolled swelling weeping edema multiple some of which have opened. He does not feel systemically unwell in particular no chest pain no shortness of breath and he does not feel 04/08/16; the edema is under better control with the Profore light wrap but he still has pitting edema. There is one large wound anteriorly 2 on the medial aspect of his left leg and 3 small areas on the superior posterior calf. Drainage is not excessive he is tolerating a Profore light well 04/15/16; put a Profore wrap on him last week. This is controlled is edema however he had a lot of pain on his left anterior foot most of his wounds are healed 04/22/16 once again the patient has denuded areas on the left anterior foot which he states are because his wrap slips up word. He saw his primary physician today is on Lasix 40 twice a day and states that he his weight is down 20 pounds over the last 3 months. 04/29/16: Much improved. left anterior foot much improved. He is now on Lasix 80 mg per day. Much improved edema control 05/06/16; I was hoping to be able to discharge him today however once again he has blisters at a low level of where the compression was placed last week mostly on his left lateral but also his left medial leg and a small area on the anterior part of the left foot. 05/09/16; apparently the patient went home after his appointment on 7/4 later in the evening developing pain in his upper medial thigh together with subjective fever and chills although his temperature was not taken. The pain was so intense he felt he would probably have to call 911. However he then remembered that he had leftover doxycycline from a previous round of antibiotics and took these. By the next morning he felt a  lot better. He called and spoke to one of our nurses and I approved doxycycline over the phone thinking that this was in relation to the wounds we had previously seen although they were definitely were not. The patient feels a lot better old fever no chills he is still working. Blood sugars are reasonably controlled 05/13/16; patient is back in for review of his cellulitis on his anterior medial upper thigh. He is taking doxycycline this is a lot better. Culture I did of the nodular area on the dorsal aspect of his foot grew MRSA this also looks a lot better. 05/20/16; the patient is cellulitis on the medial upper thigh has resolved. All of his wound areas including  the left anterior foot, areas on the medial aspect of the left calf and the lateral aspect of the calf at all resolved. He has a new blister on the left dorsal foot at the level of the fourth toe this was excised. No evidence of infection 05/27/16; patient continues to complain weeping edema. He has new blisterlike wounds on the left anterior lateral and posterior lateral calf at the top of his wrap levels. The area on his left anterior foot appears better. He is not complaining of fever, pain or pruritus in his feet. 05/30/16; the patient's blisters on his left anterior leg posterior calf all look improved. He did not increase the Lasix 100 mg as I suggested because he was going to run out of his 40 mg tablets. He is still having weeping edema of his toes 06/03/16; I renewed his Lasix at 80 mg once a day as he was about to run out when I last saw him. He is on 80 mg of Lasix now. I have asked him to cut down on the excessive amount of water he was drinking and asked him to drink according to his thirst mechanisms 06/12/2016 -- was seen 2 days ago and was supposed to wear his compression stockings at home but he is developed lymphedema and superficial blisters on the left lower extremity and hence came in for a review 06/24/16; the  remaining wound is on his left anterior leg. He still has edema coming from between his toes. There is lymphedema here however his edema is generally better than when I last saw this. He has a history of atrial fibrillation but does not have a known history of congestive heart failure nevertheless I think he probably has this at least on a diastolic basis. 07/01/16 I reviewed his echocardiogram from January 2017. This was essentially normal. He did not have LVH, EF of 55-60%. His right ventricular function was normal although he did have trivial tricuspid and pulmonic regurgitation. This is not audible on exam however. I increased his Lasix to do massive edema in his legs well above his knees I think in early July. He was also drinking an excessive amount of water at the time. 07/15/16; missed his appointment last week because of the Labor Day holiday on Monday. He could not get another appointment later in the week. Started to feel the wrap digging in superiorly so we remove the top half and the bottom half of his wrap. He has extensive erythema and blistering superiorly in the left leg. Very tender. Very swollen. Edema in his foot with leaking edema fluid. He has not been systemically unwell 07/22/16; the area on the left leg laterally required some debridement. The medial wounds look more stable. His wrap injury wounds appear to have healed. Edema and his foot is better, weeping edema is also better. He tells me he is meeting with the supplier of the external compression pumps at work 08/05/16; the patient was on vacation last week in [facility]. His wrap is been on for an extended period of time. Also over the weekend he developed an extensive area of tender erythema across his anterior medial thigh. He took to doxycycline yesterday that he had leftover from a previous prescription. The patient complains of weeping edema coming out of his toes 08/08/16; I saw this patient on 10/2. He was tender  across his anterior thigh. I put him on doxycycline. He returns today in follow-up. He does not have any open wounds on his  lower leg, he still has edema weeping into his toes. 08/12/16; patient was seen back urgently today to follow-up for his extensive left thigh cellulitis/erysipelas. He comes back with a lot less swelling and erythema pain is much better. I believe I gave him Augmentin and Cipro. His wrap was cut down as he stated a roll down his legs. He developed blistering above the level of the wrap that remained. He has 2 open blisters and 1 intact. 08/19/16; patient is been doing his primary doctor who is increased his Lasix from 40-80 once a day or 80 already has less edema. Cellulitis has remained improved in the left thigh. 2 open areas on the posterior left calf 08/26/16; he returns today having new open blisters on the anterior part of his left leg. He has his compression pumps but is not yet been shown how to use some vital representative from the supplier. 09/02/16 patient returns today with no open wounds on the left leg. Some maceration in his plantar toes 09/10/2016 -- Dr. Leanord Hawking had recently discharged him on 09/02/2016 and he has come right back with redness swelling and some open ulcers on his left lower extremity. He says this was caused by trying to apply his compression stockings and he's been unable to use this and has not been able to use his lymphedema pumps. He had some doxycycline leftover and he has started on this a few days ago. 09/16/16; there are no open wounds on his leg on the left and no evidence of cellulitis. 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 He does not complain of symptoms here. I am not clear use using his external compression pumps. 09/23/16; I have not seen this patient in 2 [name]. He canceled his appointment 10 days ago as he was going on vacation. He tells me that on Monday he noticed a  large area on his posterior left leg which is been draining copiously and is reopened into a large wound. He is been using ABDs and the external part of his juxtalite, according to our nurse this was not on properly. 10/07/16; Still a substantial area on the posterior left leg. Using silver alginate 10/14/16; in general better although there is still open area which looks healthy. Still using silver alginate. He reminds me that this happen before [facility] Today while he was showering in the morning. He had been using his juxtalite's 10/21/16; the area on his posterior left leg is fully epithelialized. However he arrives today with a large area of tender erythema in his medial and posterior left thigh just above the knee. I have marked the area. Once again he is reluctant to consider hospitalization. I treated him with oral antibiotics in the past for a similar situation with resolution I think with doxycycline however this area it seems more extensive to me. He is not complaining of fever but does have chills and says states he is thirsty. His blood sugar today was in the 140s at home 10/25/16 the area on his posterior left leg is fully epithelialized although there is still some weeping edema. The large area of tenderness and erythema in his medial and posterior left thigh is a lot less tender although there is still a lot of swelling in this thigh. He states he feels a lot better. He is on doxycycline and Augmentin that I started last week. This will continued until Tuesday, December 26. I have ordered  a duplex ultrasound of the left thigh rule out DVT whether there is an abscess something that would need to be drained I would also like to know. 11/01/16; he still has weeping edema from a not fully epithelialized area on his left posterior calf. Most of the rest of this looks a lot better. He has completed his antibiotics. His thigh is a lot better. Duplex ultrasound did not show  a DVT in the thigh 11/08/16; he comes in today with more Denuded surface epithelium from the posterior aspect of his calf. There is no real evidence of cellulitis. The superior aspect of his wrap appears to have put quite an indentation in his leg just below the knee and this may have contributed. He does not complain of pain or fever. We have been using silver alginate as the primary dressing. The area of cellulitis in the right thigh has totally resolved. He has been using his compression stockings once a week 11/15/16; the patient arrives today with more loss of epithelium from the posterior aspect of his left calf. He now has a fairly substantial wound in this area. The reason behind this deterioration isn't exactly clear although his edema is not well controlled. He states he feels he is generally more swollen systemically. He is not complaining of chest pain shortness of breath fever. Tells me he has an appointment with his primary physician in early February. He is on 80 mg of oral Lasix a day. He claims compliance with the external compression pumps. He is not having any pain in his legs similar to what he has with his recurrent cellulitis 11/22/16; the patient arrives a follow-up of his large area on his left lateral calf. This looks somewhat better today. He came in earlier in the week for a dressing change since I saw him a week ago. He is not complaining of any pain no shortness of breath no chest pain 11/28/16; the patient arrives for follow-up of his large area on the left lateral calf this does not look better. In fact it is larger weeping edema. The surface of the wound does not look too bad. We have been using silver alginate although I'm not certain that this is a dressing issue. 12/05/16; again the patient follows up for a large wound on the left lateral and left posterior calf this does not look better. There continues to be weeping edema necrotic surface tissue. More worrisome than  this once again there is erythema below the wound involving the distal Achilles and heel suggestive of cellulitis. He is on his feet working most of the day of this is not going well. We are changing his dressing twice a week to facilitate the drainage. 12/12/16; not much change in the overall dimensions of the large area on the left posterior calf. This is very inflamed looking. I gave him an. Doxycycline last week does not really seem to have helped. He found the wrap very painful indeed it seems to of dog into his legs superiorly and perhaps around the heel. He came in early today because the drainage had soaked through his dressings. 12/19/16- patient arrives for follow-up evaluation of his left lower extremity ulcers. He states that he is using his lymphedema pumps once daily when there is "no drainage". He admits to not using his lipedema pumps while under current treatment. His blood sugars have been consistently between 150-200. 12/26/16; the patient is not using his compression pumps at home because of the wetness on his  feet. I've advised him that I think it's important for him to use this daily. He finds his feet too wet, he can put a plastic bag over his legs while he is in the pumps. Otherwise I think will be in a vicious circle. We are using silver alginate to the major area on his left posterior calf 01/02/17; the patient's posterior left leg has further of all into 3 open wounds. All of them covered with a necrotic surface. He claims to be using his compression pumps once a day. His edema control is marginal. Continue with silver alginate 01/10/17; the patient's left posterior leg actually looks somewhat better. There is less edema, less erythema. Still has 3 open areas covered with a necrotic surface requiring debridement. He claims to be using his compression pumps once a day his edema control is better 01/17/17; the patient's left posterior calf look better last week when I saw him and  his wrap was changed 2 days ago. He has noted increasing pain in the left heel and arrives today with much larger wounds extensive erythema extending down into the entire heel area especially tender medially. He is not systemically unwell CBGs have been controlled no fever. Our intake nurse showed me limegreen drainage on his AVD pads. 01/24/17; his usual this patient responds nicely to antibiotics last week giving him Levaquin for presumed Pseudomonas. The whole entire posterior part of his leg is much better much less inflamed and in the case of his Achilles heel area much less tender. He has also had some epithelialization posteriorly there are still open areas here and still draining but overall considerably better 01/31/17- He has continue to tolerate the compression wraps. he states that he continues to use the lymphedema pumps daily, and can increase to twice daily on the weekends. He is voicing no complaints or concerns regarding his LLE ulcers 02/07/17-he is here for follow-up evaluation. He states that he noted some erythema to the left medial and anterior thigh, which he states is new as of yesterday. He is concerned about recurrent cellulitis. He states his blood sugars have been slightly elevated, this morning in the 180s 02/14/17; he is here for follow-up evaluation. When he was last here there was erythema superiorly from his posterior wound in his anterior thigh. He was prescribed Levaquin however a culture of the wound surface grew MRSA over the phone I changed him to doxycycline on Monday and things seem to be a lot better. 02/24/17; patient missed his appointment on Friday therefore we changed his nurse visit into a physician visit today. Still using silver alginate on the large area of the posterior left thigh. He isn't new area on the dorsal left second toe 03/03/17; actually better today although he admits he has not used his external compression pumps in the last 2 days or so  because of work responsibilities over the weekend. 03/10/17; continued improvement. External compression pumps once a day almost all of his wounds have closed on the posterior left calf. Better edema control 03/17/17; in general improved. He still has 3 small open areas on the lateral aspect of his left leg however most of the area on the posterior part of his leg is epithelialized. He has better edema control. He has an ABD pad under his stocking on the right anterior lower leg although he did not let us look at that today. 03/24/17; patient arrives back in clinic today with no open areas however there are areas on the posterior left  calf and anterior left calf that are less than 100% epithelialized. His edema is well controlled in the left lower leg. There is some pitting edema probably lymphedema in the left upper thigh. He uses compression pumps at home once per day. I tried to get him to do this twice a day although he is very reticent. 04/01/2017 -- for the last 2 days he's had significant redness, tenderness and weeping and came in for an urgent visit today. 04/07/17; patient still has 6 more days of doxycycline. He was seen by Dr. Meyer Russel last Wednesday for cellulitis involving the posterior aspect, lateral aspect of his Involving his heel. For the most part he is better there is less erythema and less weeping. He has been on his feet for 12 hours o2 over the weekend. Using his compression pumps once a day 04/14/17 arrives today with continued improvement. Only one area on the posterior left calf that is not fully epithelialized. He has intense bilateral venous inflammation associated with his chronic venous insufficiency disease and secondary lymphedema. We have been using silver alginate to the left posterior calf wound In passing he tells [place] today that the right leg but we have not seen in quite some time has an open area on it but he doesn't want [place] to look at this today states he will show  this to [place] next week. 04/21/17; there is no open area on his left leg although he still reports some weeping edema. He showed [place] his right leg today which is the first time we've seen this leg in a long time. He has a large area of open wound on the right leg anteriorly healthy granulation. Quite a bit of swelling in the right leg and some degree of venous inflammation. He told us about the right leg in passing last week but states that deterioration in the right leg really only happened over the weekend 04/28/17; there is no open area on the left leg although there is an irritated part on the posterior which is like a wrap injury. The wound on the right leg which was new from last week at least to [place] is a lot better. 05/05/17; still no open area on the left leg. Patient is using his new compression stocking which seems to be doing a good job of controlling the edema. He states he is using his compression pumps once per day. The right leg still has an open wound although it is better in terms of surface area. Required debridement. A lot of pain in the posterior right Achilles marked tenderness. Usually this type of presentation this patient gives concern for an active cellulitis 05/12/17; patient arrives today with his major wound from last week on the right lateral leg somewhat better. Still requiring debridement. He was using his compression stocking on the left leg however that is reopened with superficial wounds anteriorly he did not have an open wound on this leg previously. He is still using his juxta light's once daily at night. He cannot find the time to do this in the morning as he has to be at work by 7 AM 05/19/17; right lateral leg wound looks improved. No debridement required. The concerning area is on the left posterior leg which appears to almost have a subcutaneous hemorrhagic component to it. We've been using silver alginate to all the wounds 05/26/17; the right lateral leg wound  continues to look improved. However the area on the left posterior calf is a tightly adherent surface.  [place] using silver alginate. Because of the weeping edema in his legs there is very little good alternatives. 06/02/17; the patient left here last week looking quite good. Major wound on the left posterior calf and a small one on the right lateral calf. Both of these look satisfactory. He tells me that by Wednesday he had noted increased pain in the left leg and drainage. He called on Thursday and Friday to get an appointment here but we were blocked. He did not go to urgent care or his primary physician. He thinks he had a fever on Thursday but did not actually take his temperature. He has not been using his compression pumps on the left leg because of pain. I advised him to go to the emergency room today for IV antibiotics for stents of left leg cellulitis but he has refused I have asked him to take 2 days off work to keep his leg elevated and he has refused this as well. In view of this I'm going to call him and Augmentin and doxycycline. 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 However after a detailed discussion regarding the risk benefits and alternatives he refuses to get admitted to the hospital. With no other recourse I will continue him on oral antibiotics as before and hopefully he'll have his infectious disease consultation this week. 06/16/2017 -- the patient was seen today by the nurse practitioner at infectious disease Ms. Dixon. 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 He has also been advise Lotrimin cream locally. she also had wise IV antibiotics for his condition if it flares up 06/23/17; patient arrives today with drainage bilaterally although the remaining wound on the left posterior calf after cleaning up today "highlighter yellow drainage" did not look too bad. Unfortunately he has had breakdown on the right anterior leg [previously this leg had not been open and he is using a black stocking] he went to see infectious disease and is been put on clindamycin 150 mg daily, I did not verify the dose although I'm not familiar with using clindamycin in this dosing range, perhaps for prophylaxisoo 06/27/17; I brought this patient back today to follow-up on the wound deterioration on the right lower leg together with surrounding cellulitis. I started him on doxycycline 4 days ago. This area looks better however he comes in today with intense cellulitis on the medial part of his left thigh. This is not have a wound in this area. Extremely tender. We've been using silver alginate to the wounds on the right lower leg left lower leg with bilateral 4 layer compression he is using his external compression pumps once a day 07/04/17; patient's left medial thigh cellulitis looks better. He has not been using his compression pumps as his insert said it was contraindicated with cellulitis. His right leg continues to make improvements all the wounds are still open. We only have one remaining wound on the left posterior calf. Using silver alginate to all open areas. He is on doxycycline which I started a week ago and should be finishing I  gave him Augmentin after Thursday's visit for the severe cellulitis on the left medial thigh which fortunately looks better 07/14/17; the patient's left medial thigh cellulitis has resolved. The cellulitis in his right lower calf on the right also looks better. All of his wounds are stable to improved we've been using silver  alginate he has completed the antibiotics I have given him. He has clindamycin 150 mg once a day prescribed by infectious disease for prophylaxis, I've advised him to start this now. We have been using bilateral Unna boots over silver alginate to the wound areas 07/21/17; the patient is been to see infectious disease who noted his recurrent problems with cellulitis. He was not able to tolerate prophylactic clindamycin therefore he is on amoxicillin 500 twice a day. He also had a second daily dose of Lasix added By Dr. Oneta Rack but he is not taking this. Nor is he being completely compliant with his compression pumps a especially not this week. He has 2 remaining wounds one on the right posterior lateral lower leg and one on the left posterior medial lower leg. 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 The patient has Unna boots bilaterally. 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 He has bilateral Unna boots for compression and silver alginate to his wounds. Arrives today with his legs looking as good as I have seen him in quite some time. Not surprisingly his wounds look better as well with improvement on the right lateral leg venous insufficiency wound and also the left medial leg. He is still using the compression pumps once a day 08/11/17; both legs appear to be doing better wounds on the right lateral and left medial legs look better. Skin on the right leg quite good. He is been using silver alginate as the primary dressing. I'm going to use Anasept gel calcium alginate and maintain all the secondary dressings 08/18/17; the patient continues to actually do quite well. The area on his right lateral leg is just about closed the left medial also looks better although it  is still moist in this area. His edema is well controlled we have been using Anasept gel with calcium alginate and the usual secondary dressings, 4 layer compression and once daily use of his compression pumps "always been able to manage 09/01/17; the patient continues to do reasonably well in spite of his trip to [place]. The area on the right lateral leg is epithelialized. Left is much better but still open. He has more edema and more chronic erythema on the left leg [venous inflammation] 09/08/17; he arrives today with no open wound on the right lateral leg and decently controlled edema. Unfortunately his left leg is not nearly as in his good situation as last week.he apparently had increasing edema starting on Saturday. He edema soaked through into his foot so used a plastic bag to walk around his home. The area on the medial right leg which was his open area is about the same however he has lost surface epithelium on the left lateral which is new and he has significant pain in the Achilles area of the left foot. He is already on amoxicillin chronically for prophylaxis of cellulitis in the left leg 09/15/17; he is completed a week of doxycycline and the cellulitis in the left  posterior leg and Achilles area is as usual improved. He still has a lot of edema and fluid soaking through his dressings. There is no open wound on the right leg. He saw infectious disease NP today 09/22/17;As usual 1 we transition him from our compression wraps to his stockings things did not go well. He has several small open areas on the right leg. He states this was caused by the compression wrap on his skin although he did not wear this with the stockings over them. He has several superficial areas on the left leg medially laterally posteriorly. He does not have any evidence of active cellulitis especially involving the left Achilles The patient is traveling from [facility] Saturday going to [facility]. He states  he isn't attempting to get an appointment with a heel objects wound center there to change his dressings. I am not completely certain whether this will work 10/06/17; the patient came in on Friday for a nurse visit and the nurse reported that his legs actually look quite good. He arrives in clinic today for his regular follow-up visit. He has a new wound on his left third toe over the PIP probably caused by friction with his footwear. He has small areas on the left leg and a very superficial but epithelialized area on the right anterior lateral lower leg. Other than that his legs look as good as I've seen him in quite some time. We have been using silver alginate Review of systems; no chest pain no shortness of breath other than this a 10 point review of systems negative 10/20/17; seen by Dr. Meyer Russel last week. He had taken some antibiotics [doxycycline] that he had left over. Dr. Meyer Russel thought he had candida infection and declined to give him further antibiotics. He has a small wound remaining on the right lateral leg several areas on the left leg including a larger area on the left posterior several left medial and anterior and a small wound on the left lateral. The area on the left dorsal third toe looks a lot better. ROS; Gen.; no fever, respiratory no cough no sputum Cardiac no chest pain other than this 10 point review of system is negative 10/30/17; patient arrives today having fallen in the bathtub 3 days ago. It took him a while to get up. He has pain and maceration in the wounds on his left leg which have deteriorated. He has not been using his pumps he also has some maceration on the right lateral leg. 11/03/17; patient continues to have weeping edema especially in the left leg. This saturates his dressings which were just put on on 12/27. As usual the doxycycline seems to take care of the cellulitis on his lower leg. He is not complaining of fever, chills, or other systemic symptoms.  He states his leg feels a lot better on the doxycycline I gave him empirically. He also apparently gets injections at his primary doctor's officeo Rocephin for cellulitis prophylaxis. I didn't ask him about his compression pump compliance today I think that's probably marginal. Arrives in the clinic with all of his dressings primary and secondary macerated full of fluid and he has bilateral edema 11/10/17; the patient's right leg looks some better although there is still a cluster of wounds on the right lateral. The left leg is inflamed with almost circumferential skin loss medially to laterally although we are still maintaining anteriorly. He does not have overt cellulitis there is a lot of drainage. He is not using compression pumps.  We have been using silver alginate to the wound areas, there are not a lot of options here 11/17/17; the patient's right leg continues to be stable although there is still open wounds, better than last week. The inflammation in the left leg is better. Still loss of surface layer epithelium especially posteriorly. There is no overt cellulitis in the amount of edema and his left leg is really quite good, tells me he is using his compression pumps once a day. 11/24/17; patient's right leg has a small superficial wound laterally this continues to improve. The inflammation in the left leg is still improving however we have continuous surface layer epithelial loss posteriorly. There is no overt cellulitis in the amount of edema in both legs is really quite good. He states he is using his compression pumps on the left leg once a day for 5 out of 7 days 12/01/17; very small superficial areas on the right lateral leg continue to improve. Edema control in both legs is better today. He has continued loss of surface epithelialization and left posterior calf although I think this is better. We have been using silver alginate with large number of absorptive secondary dressings 4 layer  on the left Unna boot on the right at his request. He tells me he is using his compression pumps once a day 12/08/17; he has no open area on the right leg is edema control is good here. ooOn the left leg however he has marked erythema and tenderness breakdown of skin. He has what appears to be a wrap injury just distal to the popliteal fossa. 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 There was swelling erythema and breakdown in the right leg. He has much worse skin breakdown in the left leg as well multiple open areas medially and posteriorly as well as laterally. He tells me he has been using his compression pumps but tells me he feels that the drainage out of his leg is worse when he uses a compression pumps. To be fair to him he is been saying this for a while however I don't know that I have really been listening to this. I wonder if the compression pumps are working properly 12/22/17;. Once again he arrives with severe erythema, weeping edema from the left greater than right leg. Noncompliance with compression pumps. New this visit he is complaining of pain on the lateral aspect of the right leg and the medial aspect of his right thigh. He apparently saw his cardiologist Dr. [name] who was ordered an echocardiogram area and I think this is a step in the right direction 12/25/17; started his doxycycline Monday night. There is still intense erythema of the right leg especially in the anterior thigh although there is less tenderness. The erythema around the wound on the right lateral calf also is less tender. He still complaining of pain in the left heel. His wounds are about the same right lateral left medial  left lateral. Superficial but certainly not close to closure. He denies being systemically unwell no fever chills no abdominal pain no diarrhea 12/29/17; back in follow-up of his extensive right calf and right thigh cellulitis. I added amoxicillin to cover possible doxycycline resistant strep. This seems to of  done the trick he is in much less pain there is much less erythema and swelling. He has his echocardiogram at 11:00 this morning. X-ray of the left heel was also negative. 01/05/18; the patient arrived with his edema under much better control. Now that he is retired he is able to use his compression pumps daily and sometimes twice a day per the patient. He has a wound on the right leg the lateral wound looks better. Area on the left leg also looks a lot better. He has no evidence of cellulitis in his bilateral thighs I had a quick peak at his echocardiogram. He is in normal ejection fraction and normal left ventricular function. He has moderate pulmonary hypertension moderately reduced right ventricular function. One would have to wonder about chronic sleep apnea although he says he doesn't snore. He'll review the echocardiogram with his cardiologist. 01/12/18; the patient arrives with the edema in both legs under exemplary control. He is using his compression pumps daily and sometimes twice daily. His wound on the right lateral leg is just about closed. He still has some weeping areas on the posterior left calf and lateral left calf although everything is just about closed here as well. I have spoken with Aldean Baker who is the patient's nurse practitioner and infectious disease. She was concerned that the patient had not understood that the parenteral penicillin injections he was receiving for cellulitis prophylaxis was actually benefiting him. I don't think the patient actually saw that I would tend to agree we were certainly dealing with less infections although he had a serious one  last month. 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 He is healed. He'll be placed in juxtalite compression wraps and increase his lymphedema pumps to twice daily. We will follow up again next week to ensure there are no issues with the new regiment. 01/20/18-he is here for evaluation of bilateral lower extremity weeping edema. Yesterday he was placed in compression wrap to the right lower extremity and compression stocking to left lower shrubbery. He states he uses lymphedema pumps last night and again this morning and noted a blister to the left lower extremity. On exam he was noted to have drainage to the right lower extremity. He will be placed in Unna boots bilaterally and follow-up next week 01/26/18; patient was actually discharged a week ago to his own juxta light stockings only to return the next day with bilateral lower extremity weeping edema.he was placed in bilateral Unna boots. He arrives today with pain in the back of his left leg. There is no open area on the right leg however there is a linear/wrap injury on the left leg and weeping edema on the left leg posteriorly. I spoke with infectious disease about 10 days ago. They were disappointed that the patient elected to discontinue prophylactic intramuscular penicillin shots as they felt it was particularly beneficial in reducing the frequency of his cellulitis. I discussed this with the patient today. He does not share this view. He'll definitely need antibiotics today. Finally he is traveling to [place] and trauma leaving this Saturday and returning a week later and he does not travel with his pumps. He is going by car 01/30/18; patient was seen 4 days ago and brought back in today for review of cellulitis in the left leg posteriorly. I put him on amoxicillin this really hasn't helped as much as I might like. He is also worried because he is traveling to [facility] trauma  by car. Finally we will be  rewrapping him. There is no open area on the right leg over his left leg has multiple weeping areas as usual 02/09/18; The same wrap on for 10 days. He did not pick up the last doxycycline I prescribed for him. He apparently took 4 days worth he already had. There is nothing open on his right leg and the edema control is really quite good. He's had damage in the left leg medially and laterally especially probably related to the prolonged use of Unna boots 02/12/18; the patient arrived in clinic today for a nurse visit/wrap change. He complained of a lot of pain in the left posterior calf. He is taking doxycycline that I previously prescribed for him. Unfortunately even though he used his stockings and apparently used to compression pumps twice a day he has weeping edema coming out of the lateral part of his right leg. This is coming from the lower anterior lateral skin area. 02/16/18; the patient has finished his doxycycline and will finish the amoxicillin 2 days. The area of cellulitis in the left calf posteriorly has resolved. He is no longer having any pain. He tells me he is using his compression pumps at least once a day sometimes twice. 02/23/18; the patient finished his doxycycline and Amoxil last week. On Friday he noticed a small erythematous circle about the size of a quarter on the left lower leg just above his ankle. This rapidly expanded and he now has erythema on the lateral and posterior part of the thigh. This is bright red. Also has an area on the dorsal foot just above his toes and a tender area just below the left popliteal fossa. He came off his prophylactic penicillin injections at his own insistence one or 2 months ago. This is obviously deteriorated since then 03/02/18; patient is on doxycycline and Amoxil. Culture I did last week of the weeping area on the back of his left calf grew group B strep. I have therefore renewed the amoxicillin 500 3 times a day for a further week. He  has not been systemically unwell. Still complaining of an area of discomfort right under his left popliteal fossa. There is no open wound on the right leg. He tells me that he is using his pumps twice a day on most days 03/09/18; patient arrives in clinic today completing his amoxicillin today. The cellulitis on his left leg is better. Furthermore he tells me that he had intramuscular penicillin shots that his primary care office today. However he also states that the wrap on his right leg fell down shortly after leaving clinic last week. He developed a large blister that was present when he came in for a nurse visit later in the week and then he developed intense discomfort around this area.He tells me he is using his compression pumps 03/16/18; the patient has completed his doxycycline. The infectious part of this/cellulitis in the left heel area left popliteal area is a lot better. He has 2 open areas on the right calf. Still areas on the left calf but this is a lot better as well. 03/24/18; the patient arrives complaining of pain in the left popliteal area again. He thinks some of this is wrap injury. He has no open area on the right leg and really no open area on the left calf either except for the popliteal area. He claims to be compliant with the compression pumps 03/31/18; I gave him doxycycline last week because of cellulitis  in the left popliteal area. This is a lot better although the surface epithelium is denuded off and response to this. He arrives today with uncontrolled edema in the right calf area as well as a fingernail injury in the right lateral calf. There is only a few open areas on the left 04/06/18; I gave him amoxicillin doxycycline over the last 2 [name] that the amoxicillin should be completing currently. He is not complaining of any pain or systemic symptoms. The only open areas see has is on the right lateral lower leg paradoxically I cannot see anything on the left lower leg.  He tells me he is using his compression pumps twice a day on most days. Silver alginate to the wounds that are open under 4 layer compression 04/13/18; he completed antibiotics and has no new complaints. Using his compression pumps. Silver alginate that anything that's opened 04/20/18; he is using his compression pumps religiously. Silver alginate 4 layer compression anything that's opened. He comes in today with no open wounds on the left leg but 3 on the right including a new one posteriorly. He has 2 on the right lateral and one on the right posterior. He likes Unna boots on the right leg for reasons that aren't really clear we had the usual 4 layer compression on the left. It may be necessary to move to the 4 layer compression on the right however for now I left them in the Unna boots 04/27/18; he is using his compression pumps at least once a day. He has still the wounds on the right lateral calf. The area right posteriorly has closed. He does not have an open wound on the left under 4 layer compression however on the dorsal left foot just proximal to the toes and the left third toe 2 small open areas were identified 05/11/18; he has not uses compression pumps. The areas on the right lateral calf have coalesced into one large wound necrotic surface. On the left side he has one small wound anteriorly however the edema is now weeping out of a large part of his left leg. He says he wasn't using his pumps because of the weeping fluid. I explained to him that this is the time he needs to pump more 05/18/18; patient states he is using his compression pumps twice a day. The area on the right lateral large wound albeit superficial. On the left side he has innumerable number of small new wounds on the left calf particularly laterally but several anteriorly and medially. All these appear to have healthy granulated base these look like the remnants of blisters however they occurred under compression. The  patient arrives in clinic today with his legs somewhat better. There is certainly less edema, less multiple open areas on the left calf and the right anterior leg looks somewhat better as well superficial and a little smaller. However he relates pain and erythema over the last 3-4 days in the thigh and I looked at this today. He has not been systemically unwell no fever no chills no change in blood sugar values 05/25/18; comes in today in a better state. The severe cellulitis on his left leg seems better with the Keflex. Not as tender. He has not been systemically unwell ooHard to find an open wound on the left lower leg using his compression pumps twice a day ooThe confluent wounds on his right lateral calf somewhat better looking. These will ultimately need debridement I didn't do this today. 06/01/18; the severe cellulitis  on the left anterior thigh has resolved and he is completed his Keflex. ooThere is no open wound on the left leg however there is a superficial excoriation at the base of the third toe dorsally. Skin on the bottom of his left foot is macerated looking. ooThe left the wounds on the lateral right leg actually looks some better although he did require debridement of the top half of this wound area with an open curet 06/09/18 on evaluation today patient appears to be doing poorly in regard to his right lower extremity in particular this appears to likely be infected he has very thick purulent discharge along with a bright green tent to the discharge. This makes me concerned about the possibility of pseudomonas. He's also having increased discomfort at this point on evaluation. Fortunately there does not appear to be any evidence of infection spreading to the other location at this time. 06/16/18 on evaluation today patient appears to actually be doing fairly well. His ulcer has actually diminished in size quite significantly at this point which is good news. Nonetheless he still  does have some evidence of infection he did see infectious disease this morning before coming here for his appointment. I did review the results of their evaluation and their note today. They did actually have him discontinue the Cipro and initiate treatment with linezolid at this time. He is doing this for the next seven days and they recommended a follow-up in four months with them. He is the keep a log of the need for intermittent antibiotic therapy between now and when he falls back up with infectious disease. This will help them gaze what exactly they need to do to try and help them out. 06/23/18; the patient arrives today with no open wounds on the left leg and left third toe healed. He is been using his compression pumps twice a day. On the right lateral leg he still has a sizable wound but this is a lot better than last time I saw this. 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 Has been using silver alginate under 4 layer compression 06/30/18; the only open wound he has is on the right lateral leg and this looks healthy. No debridement is required. We have been using silver alginate. He does not have an open wound on the left leg. There is apparently some drainage from the dorsal proximal third toe on the left although I see no open wound here. 07/03/18 on evaluation today patient was actually here just for a nurse visit rapid change. However when he was here on Wednesday for his rat change due to having been healed on the left and then developing blisters we initiated the wrap again knowing that he would be back today for [place] to reevaluate and see were at. Unfortunately he has developed some cellulitis into the proximal portion of his right lower extremity even into the region of his thigh. 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 He was placed  on one as what at that point. Nonetheless he is done with that and has been tolerating it well otherwise. Doxycycline which in the past really did not seem to be effective for him. Nonetheless I think the best option may be for [place] to definitely reinitiate the antibiotics for a longer period of time. 07/07/18; since I last saw this patient  a week ago he has had a difficult time. At that point he did not have an open wound on his left leg. We transitioned him into juxta light stockings. He was apparently in the clinic the next day with blisters on the left lateral and left medial lower calf. He also had weeping edema fluid. He was put back into a compression wrap. He was also in the clinic on Friday with intense erythema in his right thigh. Per the patient he was started on Bactrim however that didn't work at all in terms of relieving his pain and swelling. He has taken 3 doxycycline that he had left over from last time and that seems to of helped. He has blistering on the right thigh as well. 07/14/18; the erythema on his right thigh has gotten better with doxycycline that he is finishing. 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 Presumably this cellulitis in the thigh was not related to that although I think this is a bit concerning going forward. He still has an area on the right lateral calf the blister on the right medial calf just below the knee that was discussed above. On the left 2 small open areas left medial and left lateral. Edema control is adequate. He is using his compression pumps twice a day 07/20/18; continued improvement in the condition of both legs especially the edema in his bilateral thighs. He tells me he is been losing weight through a combination of diet and exercise. He is using his compression pumps twice a day. So overall she made to the remaining wounds 07/27/2018; continued improvement in  condition of both legs. His edema is well controlled. The area on the right lateral leg is just about closed he had one blisters show up on the medial left upper calf. We have him in 4 layer compression. He is going on a 10-day trip to [place], [place] and [place]. He will be driving. He wants to wear Unna boots because of the lessening amount of constriction. He will not use compression pumps while he is away 08/05/18 on evaluation today patient actually appears to be doing decently well all things considered in regard to his bilateral lower extremities. The worst ulcer is actually only posterior aspect of his left lower extremity with a four layer compression wrap cut into his leg a couple [name] back. He did have a trip and actually had [employer] for the trip that he is worn since he was last here. Nonetheless he feels like the [employer] actually do better for him his swelling is up a little bit but he also with his trip was not taking his Lasix on a regular set schedule like he was supposed to be. He states that obviously the reason being that he cannot drive and keep going without having to urinate too frequently which makes it difficult. He did not have his pumps with him while he was away either which I think also maybe playing a role here too. 08/13/2018; the patient only has a small open wound on the right lateral calf which is a big improvement in the last month or 2. He also has the area posteriorly just below the posterior fossa on the left which I think was a wrap injury from several [name] ago. He has no current evidence of cellulitis. He tells me he is back into his compression pumps twice a  day. He also tells me that while he was at the laundromat somebody stole a section of his extremitease stockings 08/20/2018; back in the clinic with a much improved state. He only has small areas on the right lateral mid calf which is just about healed. This was is more  substantial area for quite a prolonged period of time. He has a small open area on the left anterior tibia. The area on the posterior calf just below the popliteal fossa is closed today. He is using his compression pumps twice a day 08/28/2018; patient has no open wound on the right leg. He has a smattering of open areas on the calf with some weeping lymphedema. More problematically than that it looks as though his wraps of slipped down in his usual he has very angry upper area of edema just below the right medial knee and on the right lateral calf. He has no open area on his feet. The patient is traveling to [facility] next week. I will send him in an antibiotic. We will continue to wrap the right leg. We ordered extremitease stockings for him last week and I plan to transition the right leg to a stocking when he gets home which will be in 10 days time. As usual he is very reluctant to take his pumps with him when he travels 09/07/2018; patient returns from [facility]. He shows me a picture of his left leg in the mid part of his trip last week with intense fire engine erythema. The picture look bad enough I would have considered sending him to the hospital. Instead he went to the wound care center in [facility] They did not prescribe him antibiotics but he did take some doxycycline he had leftover from a previous visit. I had given him trimethoprim sulfamethoxazole before he left this did not work according to the patient. This is resulted in some improvement fortunately. He comes back with a large wound on the left posterior calf. Smaller area on the left anterior tibia. Denuded blisters on the dorsal left foot over his toes. Does not have much in the way of wounds on the right leg although he does have a very tender area on the right posterior area just below the popliteal fossa also suggestive of infection. He promises me he is back on his pumps twice a day 09/15/2018; the  intense cellulitis in his left lower calf is a lot better. The wound area on the posterior left calf is also so better. However he has reasonably extensive wounds on the dorsal aspect of his second and third toes and the proximal foot just at the base of the toes. There is nothing open on the right leg 09/22/2018; the patient has excellent edema control in his legs bilaterally. He is using his external compression pumps twice a day. He has no open area on the right leg and only the areas in the left foot dorsally second and third toe area on the left side. He does not have any signs of active cellulitis. 10/06/2018; the patient has good edema control bilaterally. He has no open wound on the right leg. There is a blister in the posterior aspect of his left calf that we had to deal with today. He is using his compression pumps twice a day. There is no signs of active cellulitis. We have been using silver alginate to the wound areas. He still has vulnerable areas on the base of his left first second toes dorsally  He has a his extremities stockings and we are going to transition him today into the stocking on the right leg. He is cautioned that he will need to continue to use the compression pumps twice a day. If he notices uncontrolled edema in the right leg he may need to go to 3 times a day. 10/13/2018; the patient came in for a nurse check on Friday he has a large flaccid blister on the right medial calf just below the knee. We unroofed this. He has this and a new area underneath the posterior mid calf which was undoubtedly a blister as well. He also has several small areas on the right which is the area we put his extremities stocking on. 10/19/2018; the patient went to see infectious disease this morning I am not sure if that was a routine follow-up in any case the doxycycline I had given him was discontinued and started on linezolid. He has not started this. It is easy to look at his left calf  and the inflammation and think this is cellulitis however he is very tender in the tissue just below the popliteal fossa and I have no doubt that there is infection going on here. He states the problem he is having is that with the compression pumps the edema goes down and then starts walking the wrap falls down. We will see if we can adhere this. He has 1 or 2 minuscule open areas on the right still areas that are weeping on the posterior left calf, the base of his left second and third toes 10/26/18; back today in clinic with quite of skin breakdown in his left anterior leg. This may have been infection the area below the popliteal fossa seems a lot better however tremendous epithelial loss on the left anterior mid tibia area over quite inexpensive tissue. He has 2 blisters on the right side but no other open wound here. 10/29/2018; came in urgently to see [place] today and we worked him in for review. He states that the 4 layer compression on the right leg caused pain he had to cut it down to roughly his mid calf this caused swelling above the wrap and he has blisters and skin breakdown today. As a result of the pain he has not been using his pumps. Both legs are a lot more edematous and there is a lot of weeping fluid. 11/02/18; arrives in clinic with continued difficulties in the right leg> left. Leg is swollen and painful. multiple skin blisters and new open areas especially laterally. He has not been using his pumps on the right leg. 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 He is not systemically unwell. 11/09/2018; the patient claims he is being compliant with his pumps. He is finished the doxycycline I gave him last week. Culture I did of the wound on the right lateral leg showed a few very resistant methicillin staph aureus. This was resistant to doxycycline. Nevertheless he states the pain in the leg is a lot better which makes me wonder if the  cultured organism was not really what was causing the problem nevertheless this is a very dangerous organism to be culturing out of any wound. His right leg is still a lot larger than the left. He is using an Radio broadcast assistant on this area, he blames a 4-layer compression for causing the original skin breakdown which I doubt is true however I cannot talk him out of it. We have  been using silver alginate to all of these areas which were initially blisters 11/16/2018; patient is being compliant with his external compression pumps at twice a day. Miraculously he arrives in clinic today with absolutely no open wounds. He has better edema control on the left where he has been using 4 layer compression versus wound of wounds on the right and I pointed this out to him. There is no inflammation in the skin in his lower legs which is also somewhat unusual for him. There is no open wounds on the dorsal left foot. He has extremitease stockings at home and I have asked him to bring these in next week. 11/25/18 patient's lower extremity on examination today on the left appears for the most part to be wound free. He does have an open wound on the lateral aspect of the right lower extremity but this is minimal compared to what I've seen in past. He does request that we go ahead and wrap the left leg as well even though there's nothing open just so hopefully it will not reopen in short order. 1/28; patient has superficial open wounds on the right lateral calf left anterior calf and left posterior calf. His edema control is adequate. 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 We have been using silver alginate as the primary dressing. He claims compliance with his compression pumps 2/4; patient has superficial open wounds on numerous areas of his left calf and again one on the left dorsal foot. The areas on the right lateral calf have  healed. The cellulitis that I gave him doxycycline for last week is also resolved this was mostly on the left anterior calf just below the tibial tuberosity. His edema looks fairly well-controlled. He tells me he went to see his primary doctor today and had blood work ordered 2/11; once again he has several open areas on the left calf left tibial area. Most of these are small and appear to have healthy granulation. He does not have anything open on the right. The edema and control in his thighs is pretty good which is usually a good indication he has been using his pumps as requested. 2/18; he continues to have several small areas on the left calf and left tibial area. Most of these are small healthy granulation. We put him in his stocking on the right leg last week and he arrives with a superficial open area over the right upper tibia and a fairly large area on the right lateral tibia in similar condition. His edema control actually does not look too bad, he claims to be using his compression pumps twice a day 2/25. Continued small areas on the left calf and left tibial area. New areas especially on the right are identified just below the tibial tuberosity and on the right upper tibia itself. There are also areas of weeping edema fluid even without an obvious wound. He does not have a considerable degree of lymphedema but clearly there is more edema here than his skin can handle. He states he is using the pumps twice a day. We have an Unna boot on the right and 4 layer compression on the left. 3/3; he continues to have an area on the right lateral calf and right posterior calf just below the popliteal fossa. There is a fair amount of tenderness around the wound on the popliteal fossa but I did not see any evidence  of cellulitis, could just be that the wrap came down and rubbed in this area. ooHe does not have an open area on the left leg however there is an area on the left dorsal foot at the  base of the third toe ooWe have been using silver alginate to all wound areas 3/10; he did not have an open area on his left leg last time he was here a week ago. Today he arrives with a horizontal wound just below the tibial tuberosity and an area on the left lateral calf. He has intense erythema and tenderness in this area. The area is on the right lateral calf and right posterior calf better than last week. We have been using silver alginate as usual 3/18 - Patient returns with 3 small open areas on left calf, and 1 small open area on right calf, the skin looks ok with no significant erythema, he continues the UNA boot on right and 4 layer compression on left. The right lateral calf wound is closed , the right posterior is small area. we will continue silver alginate to the areas. 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 He is been tolerating the dressing changes without complication. Fortunately she has made excellent improvement in regard to the overall status of his wounds. Unfortunately every time we cease wrapping him he ends up reopening in causing more significant issues at that point. Again I'm unsure of the best direction to take although I think the lymphedema clinic may be appropriate for him. 02/03/19 on evaluation today patient appears to be doing well in regard to the wounds that we saw him for last week unfortunately he has a new area on the proximal portion of his right medial/posterior lower extremity where the wrap somewhat slowed down and caused swelling and a blister to rub and open. Unfortunately this is the only opening that he has on either leg at this point. 02/17/19 on evaluation today patient's bilateral lower extremities appear to be doing well. He still completely  healed in regard to the left lower extremity. In regard to the right lower extremity the area where the wrap and slid down and caused the blister still seems to be slightly open although this is dramatically better than during the last evaluation two [name] ago. I'm very pleased with the way this stands overall. 03/03/19 on evaluation today patient appears to be doing well in regard to his right lower extremity in general although he did have a new blister open this does not appear to be showing any evidence of active infection at this time. Fortunately there's No fevers, chills, nausea, or vomiting noted at this time. Overall I feel like he is making good progress it does feel like that the right leg will we perform the [employer] seems to do with a bit better than three layer wrap on the left which slid down on him. We may switch to doing bilateral in the book wraps. 5/4; I have not seen Mr. Vandenbrink in quite some time. According to our case manager he did not have an open wound on his left leg last week. He had 1 remaining wound on the right posterior medial calf. He arrives today with multiple openings on the left leg probably were blisters and/or wrap injuries from Unna boots. I do  not think the Unna boot's will provide adequate compression on the left. I am also not clear about the frequency he is using the compression pumps. 03/17/19 on evaluation today patient appears to be doing excellent in regard to his lower extremities compared to last week's evaluation apparently. He had gotten significantly worse last week which is unfortunate. The [employer] wrap on the left did not seem to do very well for him at all and in fact it didn't control his swelling significantly enough he had an additional outbreak. Subsequently we go back to the four layer compression wrap on the left. This is good news. At least in that he is doing better and the wound seem to be killing him. He still has not heard anything  from the lymphedema clinic. 03/24/19 on evaluation today patient actually appears to be doing much better in regard to his bilateral lower Trinity as compared to last week when I saw him. Fortunately there's no signs of active infection at this time. He has been tolerating the dressing changes without complication. Overall I'm extremely pleased with the progress and appearance in general. 04/07/19 on evaluation today patient appears to be doing well in regard to his bilateral lower extremities. His swelling is significantly down from where it was previous. With that being said he does have a couple blisters still open at this point but fortunately nothing that seems to be too severe and again the majority of the larger openings has healed at this time. 04/14/19 on evaluation today patient actually appears to be doing quite well in regard to his bilateral lower extremities in fact I'm not even sure there's anything significantly open at this time at any site. Nonetheless he did have some trouble with these wraps where they are somewhat irritating him secondary to the fact that he has noted that the graph wasn't too close down to the end of this foot in a little bit short as well up to his knee. Otherwise things seem to be doing quite well. 04/21/19 upon evaluation today patient's wound bed actually showed evidence of being completely healed in regard to both lower extremities which is excellent news. There does not appear to be any signs of active infection which is also good news. I'm very pleased in this regard. No fevers, chills, nausea, or vomiting noted at this time. 04/28/19 on evaluation today patient appears to be doing a little bit worse in regard to both lower extremities on the left mainly due to the fact that when he went infection disease the wrap was not wrapped quite high enough he developed a blister above this. On the right he is a small open area of nothing too significant but again  this is continuing to give him some trouble he has been were in the Velcro compression that he has at home. 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 He's been tolerating the dressing changes without complication. Fortunately there's no signs of active infection at this time. No fevers, chills, nausea, or vomiting noted at this time. We have been trying to get an appointment with her lymphedema clinic in [facility] but unfortunately nobody can get them on phone with not been able to even fax information over the patient likewise is not been able to get in touch with them. Overall I'm not sure exactly what's going on here with to reach out again today. 05/12/19 on evaluation today patient actually appears  to be doing about the same in regard to his bilateral lower Trinity ulcers. Still having a lot of drainage unfortunately. He tells me especially in the left but even on the right. There's no signs of active infection which is good news we've been using so ratcheted up to this point. 05/19/19 on evaluation today patient actually appears to be doing quite well with regard to his left lower extremity which is great news. Fortunately in regard to the right lower extremity has an issues with his wrap and he subsequently did remove this from what I'm understanding. Nonetheless long story short is what he had rewrapped once he removed it subsequently had maggots underneath this wrap whenever he came in for evaluation today. With that being said they were obviously completely cleaned away by the nursing staff. The visit today which is excellent news. However he does appear to potentially have some infection around the right ankle region where the maggots were located as well. He will likely require anabiotic therapy today. 05/26/19 on evaluation today patient actually appears to be doing much better in regard to his bilateral  lower extremities. I feel like the infection is under much better control. With that being said there were maggots noted when the wrap was removed yet again today. Again this could have potentially been left over from previous although at this time there does not appear to be any signs of significant drainage there was obviously on the wrap some drainage as well this contracted gnats or otherwise. Either way I do not see anything that appears to be doing worse in my pinion and in fact I think his drainage has slowed down quite significantly likely mainly due to the fact to his infection being under better control. 06/02/2019 on evaluation today patient actually appears to be doing well with regard to his bilateral lower extremities there is no signs of active infection at this time which is great news. With that being said he does have several open areas more so on the right than the left but nonetheless these are all significantly better than previously noted. 06/09/2019 on evaluation today patient actually appears to be doing well. His wrap stayed up and he did not cause any problems he had more drainage on the right compared to the left but overall I do not see any major issues at this time which is great news. 06/16/2019 on evaluation today patient appears to be doing excellent with regard to his lower extremities the only area that is open is a new blister that can have opened as of today on the medial ankle on the left. Other than this he really seems to be doing great I see no major issues at this point. 06/23/2019 on evaluation today patient appears to be doing quite well with regard to his bilateral lower extremities. In fact he actually appears to be almost completely healed there is a small area of weeping noted of the right lower extremity just above the ankle. Nonetheless fortunately there is no signs of active infection at this time which is good news. No fevers, chills, nausea, vomiting,  or diarrhea. 8/24; the patient arrived for a nurse visit today but complained of very significant pain in the left leg and therefore I was asked to look at this. Noted that he did not have an open area on the left leg last week nevertheless this was wrapped. The patient states that he is not been able to put his compression pumps on the  left leg because of the discomfort. He has not been systemically unwell 06/30/2019 on evaluation today patient unfortunately despite being excellent last week is doing much worse with regard to his left lower extremity today. In fact he had to come in for a nurse on Monday where his left leg had to be rewrapped due to excessive weeping Dr. Leanord Hawking placed him on doxycycline at that point. Fortunately there is no signs of active infection Systemically at this time which is good news. 07/07/2019 in regard to the patient's wounds today he actually seems to be doing well with his right lower extremity there really is nothing open or draining at this point this is great news. Unfortunately the left lower extremity is given him additional trouble at this time. There does not appear to be any signs of active infection nonetheless he does have a lot of edema and swelling noted at this point as well as blistering all of which has led to a much more poor appearing leg at this time compared to where it was 2 [name] ago when it was almost completely healed. Obviously this is a little discouraging for the patient. He is try to contact the lymphedema clinic in [place] he has not been able to get through to them. 07/14/2019 on evaluation today patient actually appears to be doing slightly better with regard to his left lower extremity ulcers. Overall I do feel like at least at the top of the wrap that we have been placing this area has healed quite nicely and looks much better. The remainder of the leg is showing signs of improvement. Unfortunately in the thigh area he still has an  open region on the left and again on the right he has been utilizing just a Band-Aid on an area that also opened on the thigh. Again this is an area that were not able to wrap although we did do an Ace wrap to provide some compression that something that obviously is a little less effective than the compression wraps we have been using on the lower portion of the leg. He does have an appointment with the lymphedema clinic in [facility] on Friday. 07/21/2019 on evaluation today patient appears to be doing better with regard to his lower extremity ulcers. He has been tolerating the dressing changes without complication. Fortunately there is no signs of active infection at this time. No fevers, chills, nausea, vomiting, or diarrhea. I did receive the paperwork from the physical therapist at the lymphedema clinic in [place]. Subsequently I signed off on that this morning and sent that back to him for further progression with the treatment plan. 07/28/2019 on evaluation today patient appears to be doing very well with regard to his right lower extremity where I do not see any open wounds at this point. Fortunately he is feeling great as far as that is concerned as well. In regard to the left lower extremity he has been having issues with still several areas of weeping and edema although the upper leg is doing better his lower leg still I think is going require the compression wrap at this time. No fevers, chills, nausea, vomiting, or diarrhea. 08/04/2019 on evaluation today patient unfortunately is having new wounds on the right lower extremity. Again we have been using Unna boot wrap on that side. We switched him to using his juxta lite wrap at home. With that being said he tells me he has been using it although his legs extremely swollen and to be honest  really does not appear that he has been. I cannot know that for sure however. Nonetheless he has multiple new wounds on the right lower  extremity at this time. Obviously we will have to see about getting this rewrapped for him today. 08/11/2019 on evaluation today patient appears to be doing fairly well with regard to his wounds. He has been tolerating the dressing changes including the compression wraps without complication. He still has a lot of edema in his upper thigh regions bilaterally he is supposed to be seeing the lymphedema clinic on the 15th of this month once his wraps arrive for the upper part of his legs. 08/18/2019 on evaluation today patient appears to be doing well with regard to his bilateral lower extremities at this point. He has been tolerating the dressing changes without complication. Fortunately there is no signs of active infection which is also good news. He does have a couple weeping areas on the first and second toe of the right foot he also has just a small area on the left foot upper leg and a small area on the left lower leg but overall he is doing quite well in my opinion. He is supposed to be getting his wraps shortly in fact tomorrow and then subsequently is seeing the lymphedema clinic next Wednesday on the 21st. Of note he is also leaving on the 25th to go on vacation for a week to the beach. For that reason and since there is some uncertainty about what there can be doing at lymphedema clinic next Wednesday I am get a make an appointment for next Friday here for [place] to see what we need to do for him prior to him leaving for vacation. 10/23; patient arrives in considerable pain predominantly in the upper posterior calf just distal to the popliteal fossa also in the wound anteriorly above the major wound. This is probably cellulitis and he has had this recurrently in the past. He has no open wound on the right side and he has had an Radio broadcast assistant in that area. Finally I note that he has an area on the left posterior calf which by enlarge is mostly epithelialized. This protrudes beyond the borders of  the surrounding skin in the setting of dry scaly skin and lymphedema. The patient is leaving for [facility] on Sunday. Per his longstanding pattern, he will not take his compression pumps with him predominantly out of fear that they will be stolen. He therefore asked that we put a Unna boot back on the right leg. He will also contact the wound care center in [facility] to see if they can change his dressing in the mid week. 11/3; patient returned from his vacation to [facility]. He was seen on 1 occasion at their wound care center. They did a 2 layer compression system as they did not have our 4-layer wrap. I am not completely certain what they put on the wounds. They did not change the Unna boot on the right. The patient is also seeing a lymphedema specialist physical therapist in [place] [employer]. It appears that he has some compression sleeve for his thighs which indeed look quite a bit better than I am used to seeing. He pumps over these with his external compression pumps. 11/10; the patient has a new wound on the right medial thigh otherwise there is no open areas on the right. He has an area on the left leg posteriorly anteriorly and medially and an area over the left second  toe. We have been using silver alginate. He thinks the injury on his thigh is secondary to friction from the compression sleeve he has. 11/17; the patient has a new wound on the right medial thigh last week. He thinks this is because he did not have a underlying stocking for his thigh juxta lite apparatus. He now has this. The area is fairly large and somewhat angry but I do not think he has underlying cellulitis. ooHe has a intact blister on the right anterior tibial area. ooSmall wound on the right great toe dorsally ooSmall area on the medial left calf. 11/30; the patient does not have any open areas on his right leg and we did not take his juxta lite stocking off. However he states that on Friday his  compression wrap fell down lodging around his upper mid calf area. As usual this creates a lot of problems for him. He called urgently today to be seen for a nurse visit however the nurse visit turned into a provider visit because of extreme erythema and pain in the left anterior tibia extending laterally and posteriorly. The area that is problematic is extensive 10/06/2019 upon evaluation today patient actually appears to be doing poorly in regard to his left lower extremity. He Dr. Leanord Hawking did place him on doxycycline this past Monday apparently due to the fact that he was doing much worse in regard to this left leg. Fortunately the doxycycline does seem to be helping. Unfortunately we are still having a very difficult time getting his edema under any type of control in order to anticipate discharge at some point. The only way were really able to control his lymphedema really is with compression wraps and that has only even seemingly temporary. He has been seeing a lymphedema clinic they are trying to help in this regard but still this has been somewhat frustrating in general for the patient. 10/13/19 on evaluation today patient appears to be doing excellent with regard to his right lower extremity as far as the wounds are concerned. His swelling is still quite extensive unfortunately. He is still having a lot of drainage from the thigh areas bilaterally which is unfortunate. He's been going to lymphedema clinic but again he still really does not have this edema under control as far as his lower extremities are concern. With regard to his left lower extremity this seems to be improving and I do believe the doxycycline has been of benefit for him. He is about to complete the doxycycline. 10/20/2019 on evaluation today patient appears to be doing poorly in regard to his bilateral lower extremities. More in the right thigh he has a lot of irritation at this site unfortunately. In regard to the left  lower extremity the wrap was not quite as high it appears and does seem to have caused him some trouble as well. Fortunately there is no evidence of systemic infection though he does have some blue-green drainage which has me concerned for the possibility of Pseudomonas. He tells me he is previously taking Cipro without complications and he really does not care for Levaquin however due to some of the side effects he has. He is not allergic to any medications specifically antibiotics that were aware of. 10/27/2019 on evaluation today patient actually does appear to be for the most part doing better when compared to last week's evaluation. With that being said he still has multiple open wounds over the bilateral lower extremities. He actually forgot to start taking the Cipro and  states that he still has the whole bottle. He does have several new blisters on left lower extremity today I think I would recommend he go ahead and take the Cipro based on what I am seeing at this point. 12/30-Patient comes at 1 week visit, 4 layer compression wraps on the left and Unna boot on the right, primary dressing Xtrasorb and silver alginate. Patient is taking his Cipro and has a few more days left probably 5-6, and the legs are doing better. He states he is using his compressions devices which I believe he has 11/10/2019 on evaluation today patient actually appears to be much better than last time I saw him 2 [name] ago. His wounds are significantly improved and overall I am very pleased in this regard. Fortunately there is no signs of active infection at this time. He is just a couple of days away from completing Cipro. Overall his edema is much better he has been using his lymphedema pumps which I think is also helping at this point. 11/17/2019 on evaluation today patient appears to be doing excellent in regard to his wounds in general. His legs are swollen but not nearly as much as they have been in the past.  Fortunately he is tolerating the compression wraps without complication. No fevers, chills, nausea, vomiting, or diarrhea. He does have some erythema however in the distal portion of his right lower extremity specifically around the forefoot and toes there is a little bit of warmth here as well. 11/24/2019 on evaluation today patient appears to be doing well with regard to his right lower extremity I really do not see any open wounds at this point. His left lower extremity does have several open areas and his right medial thigh also is open. Other than this however overall the patient seems to be making good progress and I am very pleased at this point. 12/01/2019 on evaluation today patient appears to be doing poorly at this point in regard to his left lower extremity has several new blisters despite the fact that we have him in compression wraps. In fact he had a 4-layer compression wrap, his upper thigh wrapped from lymphedema clinic, and a juxta light over top of the 4 layer compression wrap the lymphedema clinic applied and despite all this he still develop blisters underneath. Obviously this does have me concerned about the fact that unfortunately despite what we are doing to try to get wounds healed he continues to have new areas arise I do not think he is ever good to be at the point where he can realistically just use wraps at home to keep things under control. Typically when we heal him it takes about 1-2 days before he is back in the clinic with severe breakdown and blistering of his lower extremities bilaterally. This is happened numerous times in the past. Unfortunately I think that we may need some help as far as overall fluid overload to kind of limit what we are seeing and get things under better control. Objective Constitutional Well-nourished and well-hydrated in no acute distress. Vitals Time Taken: 10:40 AM, Height: 70 in, Weight: 380.2 lbs, BMI: 54.5, Temperature: 98.2 F,  Pulse: 55 bpm, Respiratory Rate: 20 breaths/min, Blood Pressure: 152/61 mmHg, Capillary Blood Glucose: 141 mg/dl. Respiratory normal breathing without difficulty. Psychiatric this patient is able to make decisions and demonstrates good insight into disease process. Alert and Oriented x 3. pleasant and cooperative. General Notes: Patient's wound bed currently showed signs again of healing at most  locations although the new blistered areas obviously are likely open up in the new wounds unfortunately and this is good to be of concern as well. I do believe that the overall lymphedema is an issue but also even with a 4-layer compression wrap, a juxta light over top of this, and even using the upper thigh compression wraps the patient still has 3+ pitting edema on evaluation today. That is quite significant and I think is part of the big problem that we are seeing at this point. I did discuss with him the fact that weight loss could potentially help some with this but I am still not even sure that would necessarily resolve the issue in general. Integumentary (Hair, Skin) Wound #145R status is Open. Original cause of wound was Gradually Appeared. The wound is located on the Right,Medial Upper Leg. The wound measures 2.5cm length x 4cm width x 0.1cm depth; 7.854cm^2 area and 0.785cm^3 volume. There is no tunneling or undermining noted. There is a medium amount of serosanguineous drainage noted. The wound margin is flat and intact. There is large (67-100%) red granulation within the wound bed. There is no necrotic tissue within the wound bed. Wound #154 status is Open. Original cause of wound was Shear/Friction. The wound is located on the Left,Medial Lower Leg. The wound measures 1.5cm length x 2.4cm width x 0.1cm depth; 2.827cm^2 area and 0.283cm^3 volume. There is no tunneling or undermining noted. There is a medium amount of serosanguineous drainage noted. The wound margin is distinct with the  outline attached to the wound base. There is large (67-100%) red granulation within the wound bed. There is no necrotic tissue within the wound bed. Wound #156 status is Open. Original cause of wound was Blister. The wound is located on the Left,Dorsal Foot. The wound measures 0.9cm length x 0.8cm width x 0.1cm depth; 0.565cm^2 area and 0.057cm^3 volume. There is no tunneling or undermining noted. There is a medium amount of serosanguineous drainage noted. The wound margin is distinct with the outline attached to the wound base. There is large (67-100%) red, pink granulation within the wound bed. There is no necrotic tissue within the wound bed. 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 There was a Four Layer Compression Therapy Procedure by Yevonne Pax, RN. Post procedure Diagnosis Wound #154: Same as Pre-Procedure There was a Radio broadcast assistant Compression Therapy Procedure by Yevonne Pax, RN. Post procedure Diagnosis Wound #: Same as Pre-Procedure Plan Follow-up Appointments: Return Appointment in 1 week. Dressing Change Frequency: Do not change entire dressing for one week. - both lower legs Skin Barriers/Peri-Wound Care: Moisturizing lotion TCA Cream or Ointment - to legs mixed with lotion Wound Cleansing: May shower with protection. Primary Wound Dressing: Wound #145R Right,Medial Upper Leg: Calcium Alginate with Silver Wound #154 Left,Medial Lower Leg: Calcium  Alginate with Silver Wound #156 Left,Dorsal Foot: Calcium Alginate with Silver Secondary Dressing: Wound #145R Right,Medial Upper Leg: ABD pad - secured with tape Wound #154 Left,Medial Lower Leg: Dry Gauze ABD pad Wound #156 Left,Dorsal Foot: Dry Gauze Edema Control: 4 layer compression: Left  lower extremity - unna boot at upper portion of lower leg. Unna Boot to Right Lower Extremity Avoid standing for long periods of time Elevate legs to the level of the heart or above for 30 minutes daily and/or when sitting, a frequency of: - throughout the day Exercise regularly Segmental Compressive Device. - lymphadema pumps 60 min 2 times per day Other: - lymphadema wraps to upper leg per [facility]a clinic Additional Orders / Instructions: Other: - follow up with cardiology Dr. Marshell Levan or primary care MD for eval of chronic lower extremity 3+ pitting edema and fluid overload Consults ordered were: Cardiology - Follow up with Dr. Marshell Levan for evaluation of chronic 3+ pitting edema in lower legs and possible fluid overload 1. Patient continues to have significant pitting edema despite the fact that were using a 4-layer compression wrap, he has lymphedema wraps for the knee and thigh regions, and even was using a juxta light compression wrap over the 4 layer compression wrap the left lower extremity still develop new blisters. I do believe he may need to see someone to discuss possible fluid/volume overload and the fact that even with the significant amount of Lasix that he is on he tells me he is on 80 mg that this may not be controlling the fluid in general enough. 2. I did discuss with him weight loss and obviously I think this could be beneficial for him as far as helping with some of the fluid overload at this point. Nonetheless that is not obviously an easy prospect and I understand that he also tells me the fact that he is not able to get to the gym where he used to go to exercise has made  a difference as well. Nonetheless I still think weight loss needs to be something strongly considered but again I am not entirely sure that alone would take care of the issue as a whole. 3. I am going to see if we can get him in with Dr. [name] his cardiologist in order to discuss any possible options for trying to help with overall fluid/volume overload at this point. The patient is in agreement with that plan we will make the referral. 4. In the meantime we will continue with the current wound care measures which include silver alginate to the open wounds and then subsequently we will have him continue as well with the compression wraps. 4-layer compression wrap on left lower extremity Unna boot on the right lower extremity. 5. The patient also should continue to use his lymphedema pumps ideally 2 times a day would be the best right now he is doing 1 a day and still has 3+ pitting edema at this point. We will see patient back for reevaluation in 1 week here in the clinic. If anything worsens or changes patient will contact our office for additional recommendations. Electronic Signature(s) Signed: 12/01/2019 11:30:15 AM By: Lenda Kelp PA-C Entered By: Lenda Kelp on 12/01/2019 11:30:15 -------------------------------------------------------------------------------- SuperBill Details Patient Name: Date of Service: DEYON, CHIZEK 12/01/2019 Medical Record [id number] Patient Account Number: [id number] Date of Birth/Sex: Treating RN: December 09, 1950 (69 y.o. [name] Primary Care Provider: Nicoletta [name] Other Clinician: Referring Provider: Treating Provider/Extender:[name], [name], [name] in Treatment: 201 Diagnosis Coding ICD-10 Codes Code Description 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 skin L97.221 Non-pressure chronic ulcer of left calf limited to breakdown of skin I87.333 Chronic venous hypertension (idiopathic)  with ulcer and inflammation of bilateral lower extremity I89.0  Lymphedema, not elsewhere classified E11.622 Type 2 diabetes mellitus with other skin ulcer 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, unspecified L03.116 Cellulitis of left lower limb Facility Procedures CPT4 Code Description: [id number] (Facility Use Only) [phone] - APPLY UNNA BOOT RT Modifier: Quantity: 1 CPT4 Code Description: 08657846 (Facility Use Only) [phone] - APPLY MULTLAY COMPRS LWR LT LEG Modifier: 59 Quantity: 1 Physician Procedures CPT4: Description Modifier Quantity Code [id number] - WC PHYS LEVEL 4 - EST PT 1 ICD-10 Diagnosis Description 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

## 2019-12-01 NOTE — Progress Notes (Signed)
Kevin, [name] ([id number]) Visit Report for 12/01/2019 Arrival Information Details Patient Name: Date of Service: [name], [name] 12/01/2019 10:15 AM Medical Record [id number] Patient Account Number: [id number] Date of Birth/Sex: Treating RN: November 23, 1950 (69 y.o. [name] Primary Care Josten Warmuth: [name] Other Clinician: Referring Koralyn Prestage: Treating Hillery Bhalla/Extender:[name], Dema Severin, [name] in Treatment: 74 Visit Information History Since Last Visit Walker Added or deleted any medications: No Patient Arrived: Any new allergies or adverse reactions: No Arrival Time: 10:26 Had a fall or experienced change in No Accompanied By: self activities of daily living that may affect Transfer Assistance: None risk of falls: Patient Identification Verified: Yes Signs or symptoms of abuse/neglect since last No Secondary Verification Process Completed: Yes visito Patient Requires Transmission-Based No Hospitalized since last visit: No Precautions: Implantable device outside of the clinic excluding No Patient Has Alerts: Yes cellular tissue based products placed in the center since last visit: Has Dressing in Place as Prescribed: Yes Has Compression in Place as Prescribed: Yes Pain Present Now: No Electronic Signature(s) Signed: 12/01/2019 5:26:35 PM By: Deon Pilling Entered By: Deon Pilling on 12/01/2019 10:29:50 -------------------------------------------------------------------------------- Compression Therapy Details Patient Name: Date of Service: Kevin, [name] 12/01/2019 10:15 AM Medical Record [id number] Patient Account Number: [id number] Date of Birth/Sex: Treating RN: [birth year] (69 y.o. [name] Primary Care Yilin Weedon: [name] Other Clinician: Referring Uriyah Raska: Treating Aleia Larocca/Extender:[name], Dema Severin, [name] in Treatment: 201 Compression Therapy Performed for Wound Wound #154 Left,Medial Lower  Leg Assessment: Performed By: Clinician [name], RN Compression Type: Four Layer Post Procedure Diagnosis Same as Pre-procedure Electronic Signature(s) Signed: 12/01/2019 6:17:53 PM By: Baruch Gouty RN, BSN Entered By: Baruch Gouty on 12/01/2019 11:17:17 -------------------------------------------------------------------------------- Compression Therapy Details Patient Name: Date of Service: Kevin, [name] 12/01/2019 10:15 AM Medical Record [id number] Patient Account Number: [id number] Date of Birth/Sex: Treating RN: [birth year] (69 y.o. [name] Primary Care Joetta Delprado: [name] Other Clinician: Referring Heith Haigler: Treating Wenceslaus Gist/Extender:[name], Dema Severin, [name] in Treatment: 201 Compression Therapy Performed for Wound NonWound Condition Lymphedema - Right Leg Assessment: Performed By: Clinician [name], RN Compression Type: [name] Post Procedure Diagnosis Same as Pre-procedure Electronic Signature(s) Signed: 12/01/2019 6:17:53 PM By: Baruch Gouty RN, BSN Entered By: Baruch Gouty on 12/01/2019 11:17:45 -------------------------------------------------------------------------------- Encounter Discharge Information Details Patient Name: Date of Service: Kevin, [name] 12/01/2019 10:15 AM Medical Record [id number] Patient Account Number: [id number] Date of Birth/Sex: Treating RN: 1951/07/02 (69 y.o. [name] Primary Care Maciah Schweigert: [name] Other Clinician: Referring Shamara Soza: Treating Wojciech Willetts/Extender:[name], Dema Severin, [name] in Treatment: 201 Encounter Discharge Information Items Discharge Condition: Stable Ambulatory Status: Walker Discharge Destination: Home Transportation: Private Auto Accompanied By: self Schedule Follow-up Appointment: Yes Clinical Summary of Care: Electronic Signature(s) Signed: 12/01/2019 5:26:35 PM By: Deon Pilling Entered By: Deon Pilling on  12/01/2019 12:00:08 -------------------------------------------------------------------------------- Lower Extremity Assessment Details Patient Name: Date of Service: Kevin, [name] 12/01/2019 10:15 AM Medical Record [id number] Patient Account Number: [id number] Date of Birth/Sex: Treating RN: 1951/03/09 (69 y.o. [name] Primary Care Marshelle Bilger: [name] Other Clinician: Referring Tamas Suen: Treating Alpheus Stiff/Extender:[name], Dema Severin, [name] in Treatment: 201 Edema Assessment Assessed: [Left: Yes] [Right: Yes] Edema: [Left: Yes] [Right: Yes] Calf Left: Right: Point of Measurement: 31 cm From Medial Instep 42 cm 41 cm Ankle Left: Right: Point of Measurement: 12 cm From Medial Instep 27 cm 28 cm Electronic Signature(s) Signed: 12/01/2019 5:26:35 PM By: Deon Pilling Entered By: Deon Pilling on 12/01/2019 10:35:42 -------------------------------------------------------------------------------- Multi-Disciplinary Care  Plan Details Patient Name: Date of Service: Kevin, [name] 12/01/2019 10:15 AM Medical Record [id number] Patient Account Number: [id number] Date of Birth/Sex: Treating RN: 18-Jul-1951 (69 y.o. [name] Primary Care Daquana Paddock: [name] Other Clinician: Referring Cova Knieriem: Treating Joie Hipps/Extender:[name], Dema Severin, [name] in Treatment: 201 Active Inactive Venous Leg Ulcer Nursing Diagnoses: Actual venous Insuffiency (use after diagnosis is confirmed) Goals: Patient will maintain optimal edema control Date Initiated: 09/10/2016 Target Resolution Date: 12/29/2019 Goal Status: Active Verify adequate tissue perfusion prior to therapeutic compression application Date Initiated: 09/10/2016 Date Inactivated: 11/28/2016 Goal Status: Met Interventions: Assess peripheral edema status every visit. Compression as ordered Provide education on venous insufficiency Notes: edema not contolled above wraps, pt  not using lymoh pumps regularly Wound/Skin Impairment Nursing Diagnoses: Impaired tissue integrity Goals: Patient/caregiver will verbalize understanding of skin care regimen Date Initiated: 09/10/2016 Target Resolution Date: 12/29/2019 Goal Status: Active Interventions: Assess patient/caregiver ability to perform ulcer/skin care regimen upon admission and as needed Assess ulceration(s) every visit Provide education on ulcer and skin care Notes: Electronic Signature(s) Signed: 12/01/2019 6:17:53 PM By: Baruch Gouty RN, BSN Entered By: Baruch Gouty on 12/01/2019 11:15:57 -------------------------------------------------------------------------------- Pain Assessment Details Patient Name: Date of Service: [name] 12/01/2019 10:15 AM Medical Record [id number] Patient Account Number: [id number] Date of Birth/Sex: Treating RN: 1951-09-18 (69 y.o. [name] Primary Care Rasheeda Mulvehill: [name] Other Clinician: Referring Yoshi Vicencio: Treating Victormanuel Mclure/Extender:[name], Dema Severin, [name] in Treatment: 201 Active Problems Location of Pain Severity and Description of Pain Patient Has Paino No Site Locations Rate the pain. Current Pain Level: 0 Pain Management and Medication Current Pain Management: Medication: No Cold Application: No Rest: No Massage: No Activity: No T.E.N.S.: No Heat Application: No Leg drop or elevation: No Is the Current Pain Management Adequate: Adequate How does your wound impact your activities of daily livingo Sleep: No Bathing: No Appetite: No Relationship With Others: No Bladder Continence: No Emotions: No Bowel Continence: No Work: No Toileting: No Drive: No Dressing: No Hobbies: No Electronic Signature(s) Signed: 12/01/2019 5:26:35 PM By: Deon Pilling Entered By: Deon Pilling on 12/01/2019 10:30:25 -------------------------------------------------------------------------------- Patient/Caregiver  Education Details Patient Name: Date of Service: [name] 1/27/2021andnbsp10:15 AM Medical Record [id number] Patient Account Number: [id number] Date of Birth/Gender: Treating RN: Oct 19, 1951 (69 y.o. [name] Primary Care Physician: [name] Other Clinician: Referring Physician: Treating Physician/Extender:[name], Dema Severin, [name] in Treatment: [place] Education Assessment Education Provided To: Patient Education Topics Provided Venous: Methods: Explain/Verbal Responses: Reinforcements needed, State content correctly Wound/Skin Impairment: Methods: Explain/Verbal Responses: Reinforcements needed, State content correctly Electronic Signature(s) Signed: 12/01/2019 6:17:53 PM By: Baruch Gouty RN, BSN Entered By: Baruch Gouty on 12/01/2019 11:16:28 -------------------------------------------------------------------------------- Wound Assessment Details Patient Name: Date of Service: JERICO, GRISSO 12/01/2019 10:15 AM Medical Record [id number] Patient Account Number: [id number] Date of Birth/Sex: Treating RN: 06-10-1951 (69 y.o. M) Primary Care Eliyanah Elgersma: [name] Other Clinician: Referring Lydia Toren: Treating Aravind Chrismer/Extender:[name], Dema Severin, [name] in Treatment: 201 Wound Status Wound Number: [id number] Primary Lymphedema Etiology: Wound Location: Right Upper Leg - Medial Wound Open Wounding Event: Gradually Appeared Status: Date Acquired: 09/12/2019 Comorbid Chronic sinus problems/congestion, Weeks Of Treatment: 11 History: Arrhythmia, Hypertension, Peripheral Arterial Clustered Wound: No Disease, Type II Diabetes, History of Burn, Gout, Confinement Anxiety Photos Wound Measurements Length: (cm) 2.5 Width: (cm) 4 Depth: (cm) 0.1 Area: (cm) 7.854 Volume: (cm) 0.785 Wound Description Classification: Partial Thickness Wound Margin: Flat and Intact Exudate Amount: Medium Exudate Type:  Serosanguineous Exudate Color: red, brown  Wound Bed Granulation Amount: Large (67-100%) Granulation Quality: Red Necrotic Amount: None Present (0%) After Cleansing: No brino No Exposed Structure sed: No Subcutaneous Tissue) Exposed: No sed: No sed: No ed: No d: No % Reduction in Area: -566.7% % Reduction in Volume: -565.3% Epithelialization: Medium (34-66%) Tunneling: No Undermining: No Foul Odor Slough/Fi Fascia Expo Fat Layer ( Tendon Expo Muscle Expo Joint Expos Bone Expose Treatment Notes Wound #154 (Left, Medial Lower Leg) 1. Cleanse With Wound Cleanser Soap and water 2. Periwound Care Moisturizing lotion TCA Cream 3. Primary Dressing Applied Calcium Alginate Ag 4. Secondary Dressing ABD Pad Notes secure with tape Electronic Signature(s) Signed: 12/01/2019 4:33:34 PM By: Mikeal Hawthorne EMT/HBOT Entered By: Mikeal Hawthorne on 12/01/2019 15:52:27 -------------------------------------------------------------------------------- Wound Assessment Details Patient Name: Date of Service: BENTLY, WYSS 12/01/2019 10:15 AM Medical Record [id number] Patient Account Number: [id number] Date of Birth/Sex: Treating RN: Feb 27, 1951 (69 y.o. M) Primary Care Jynesis Nakamura: [name] Other Clinician: Referring Shamirah Ivan: Treating Lorris Carducci/Extender:[name], Dema Severin, [name] in Treatment: 201 Wound Status Wound Number: [id number] Primary Lymphedema Etiology: Wound Location: Left Lower Leg - Medial Wound Open Wounding Event: Shear/Friction Status: Date Acquired: 11/10/2019 Comorbid Chronic sinus problems/congestion, Weeks Of Treatment: 3 History: Arrhythmia, Hypertension, Peripheral Arterial Clustered Wound: No Disease, Type II Diabetes, History of Burn, Gout, Confinement Anxiety Photos Wound Measurements Length: (cm) 1.5 % Reduct Width: (cm) 2.4 % Reduct Depth: (cm) 0.1 Epitheli Area: (cm) 2.827 Tunneli Volume: (cm) 0.283 Undermi Wound  Description Full Thickness Without Exposed Support Foul O Classification: Structures Slough Wound Distinct, outline attached Margin: Exudate Medium Amount: Exudate Serosanguineous Type: Exudate red, brown Color: Wound Bed Granulation Amount: Large (67-100%) Granulation Quality: Red Fascia Necrotic Amount: None Present (0%) Fat Lay Tendon Muscle Joint E Bone Ex dor After Cleansing: No /Fibrino No Exposed Structure Exposed: No er (Subcutaneous Tissue) Exposed: No Exposed: No Exposed: No xposed: No posed: No ion in Area: -1700.6% ion in Volume: -1668.7% alization: Small (1-33%) ng: No ning: No Treatment Notes Wound #154 (Left, Medial Lower Leg) 1. Cleanse With Wound Cleanser Soap and water 2. Periwound Care Moisturizing lotion TCA Cream 3. Primary Dressing Applied Calcium Alginate Ag 4. Secondary Dressing ABD Pad Dry Gauze Kerramax/Xtrasorb 6. Support Layer Applied 4 layer compression wrap Notes first layer of unna boot used to secure compression system in place. Electronic Signature(s) Signed: 12/01/2019 4:33:34 PM By: Mikeal Hawthorne EMT/HBOT Entered By: Mikeal Hawthorne on 12/01/2019 15:51:38 -------------------------------------------------------------------------------- Wound Assessment Details Patient Name: Date of Service: KALID, GHAN 12/01/2019 10:15 AM Medical Record [id number] Patient Account Number: [id number] Date of Birth/Sex: Treating RN: July 29, 1951 (69 y.o. M) Primary Care Paizlee Kinder: [name] Other Clinician: Referring Said Rueb: Treating Audreyana Huntsberry/Extender:[name], Dema Severin, [name] in Treatment: 201 Wound Status Wound Number: [id number] Primary Diabetic Wound/Ulcer of the Lower Extremity Etiology: Wound Location: Left Foot - Dorsal Wound Open Wounding Event: Blister Status: Date Acquired: 11/24/2019 Comorbid Chronic sinus problems/congestion, Weeks Of Treatment: 1 History: Arrhythmia, Hypertension, Peripheral  Arterial Clustered Wound: No Disease, Type II Diabetes, History of Burn, Gout, Confinement Anxiety Photos Wound Measurements Length: (cm) 0.9 Width: (cm) 0.8 Depth: (cm) 0.1 Area: (cm) 0.565 Volume: (cm) 0.057 Wound Description Classification: Grade 1 Wound Margin: Distinct, outline attached Exudate Amount: Medium Exudate Type: Serosanguineous Exudate Color: red, brown Wound Bed Granulation Amount: Large (67-100%) Granulation Quality: Red, Pink Necrotic Amount: None Present (0%) After Cleansing: No brino No Exposed Structure osed: No (Subcutaneous Tissue) Exposed: No osed: No osed: No xposed: No posed: No % Reduction in Area: 48.6% % Reduction  in Volume: 48.2% Epithelialization: Large (67-100%) Tunneling: No Undermining: No Foul Odor Slough/Fi Fascia Exp Fat Layer Tendon Exp Muscle Exp Joint E Bone Ex Treatment Notes Wound #156 (Left, Dorsal Foot) 1. Cleanse With Wound Cleanser Soap and water 2. Periwound Care Moisturizing lotion TCA Cream 3. Primary Dressing Applied Calcium Alginate Ag 4. Secondary Dressing ABD Pad Dry Gauze Kerramax/Xtrasorb 6. Support Layer Applied 4 layer compression wrap Notes first layer of unna boot used to secure compression system in place. Electronic Signature(s) Signed: 12/01/2019 4:33:34 PM By: Mikeal Hawthorne EMT/HBOT Entered By: Mikeal Hawthorne on 12/01/2019 15:50:24 -------------------------------------------------------------------------------- Vitals Details Patient Name: Date of Service: CHEN, SAADEH 12/01/2019 10:15 AM Medical Record [id number] Patient Account Number: [id number] Date of Birth/Sex: Treating RN: Nov 06, 1950 (69 y.o. [name] Primary Care Percy Winterrowd: [name] Other Clinician: Referring Daryll Spisak: Treating Maliki Gignac/Extender:[name], Dema Severin, [name] in Treatment: 201 Vital Signs Time Taken: 10:40 Temperature (F): 98.2 Height (in): 70 Pulse (bpm): 55 Weight  (lbs): 380.2 Respiratory Rate (breaths/min): 20 Body Mass Index (BMI): 54.5 Blood Pressure (mmHg): 152/61 Capillary Blood Glucose (mg/dl): 141 Reference Range: 80 - 120 mg / dl Electronic Signature(s) Signed: 12/01/2019 5:26:35 PM By: Deon Pilling Entered By: Deon Pilling on 12/01/2019 10:46:40

## 2019-12-06 ENCOUNTER — Other Ambulatory Visit: Payer: Self-pay

## 2019-12-06 ENCOUNTER — Encounter: Payer: Self-pay | Admitting: Internal Medicine

## 2019-12-06 ENCOUNTER — Ambulatory Visit: Payer: Medicare Other | Admitting: Internal Medicine

## 2019-12-06 VITALS — BP 141/63 | HR 76 | Ht 70.0 in | Wt 393.2 lb

## 2019-12-06 DIAGNOSIS — I50813 Acute on chronic right heart failure: Secondary | ICD-10-CM

## 2019-12-06 DIAGNOSIS — I4819 Other persistent atrial fibrillation: Secondary | ICD-10-CM | POA: Diagnosis not present

## 2019-12-06 DIAGNOSIS — R600 Localized edema: Secondary | ICD-10-CM

## 2019-12-06 NOTE — Patient Instructions (Signed)
Medication Instructions:  Take lasix 80mg  TWICE DAILY *If you need a refill on your cardiac medications before your next appointment, please call your pharmacy*   Follow-Up: At [facility], you and your health needs are our priority.  As part of our continuing mission to provide you with exceptional heart care, we have created designated Provider Care Teams.  These Care Teams include your primary Cardiologist (physician) and Advanced Practice Providers (APPs -  Physician Assistants and Nurse Practitioners) who all work together to provide you with the care you need, when you need it.  Your next appointment:   2 month(s)  The format for your next appointment:   In Person  Provider:   You may see Dr. [facility] or one of the following Advanced Practice Providers on your designated Care Team:    [name], PA-C  Azalee Course, Micah Flesher or   [place], Judy Pimple   Other Instructions

## 2019-12-06 NOTE — Progress Notes (Signed)
OFFICE NOTE  Chief Complaint:  Leg swelling  Primary Care Physician: [name], MD  HPI:  [name] is a 69 year old male who is currently referred by Dr. Milinda Cave for new onset atrial flutter/fibrillation. [name] was at his office for a routine physical and underwent an EKG. This demonstrated either coarse A. Fib or atrial flutter with variable ventricular response and a rate around 80. It was noted that he was already on metoprolol tartrate 75 mg twice daily for hypertension. 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  He is reportedly unaware of his atrial fibrillation. Fortunately an EKG in the office today shows that he is back in a sinus rhythm or sinus arrhythmia. There is no family history of coronary disease or A. Fib. He denies any chest pain or worsening shortness of breath, but has been having problems with sciatic pain in his back and difficulty ambulating. He reports he gets good sleep at night, rarely wakes up, does not have morning headaches or the need for daytime napping. His screening EPWSS was only 3, suggesting that obstructive sleep apnea is actually fairly unlikely, despite his significant weight.  [name] returns today for follow-up. He underwent a nuclear stress test which was a 2 day study. This demonstrated significant TID 1.48, however it is unclear how reliable this could be because of the fact the study was done on 2 different days. The test was negative for ischemia. EF was calculated at 42%, which is lower than his EF was thought to be at 50%. He denies any chest pain, ever. He also has stable shortness of breath but no worsening symptoms. He reportedly was unaware of A. fib and still has not had any symptoms. He is currently on Xarelto and is not having any bleeding problems.  [name] returns today for follow-up. He reports some  recent worsening of shortness of breath on exertion. He saw Dr. Milinda Cave for this and had some increase in his Lasix. Up to 80 mg daily. He is currently taking 40 mg daily does not notice significant difference in his symptoms. He had a recent echocardiogram which shows an improvement in LV function up to 55-60%. He feels some of this is due to excess weight. He's been less active. He is now close to 400 pounds. Has pain. He has recurrent atrial fibrillation and is in A. fib today to rate is 78. This is been paroxysmal in the past however may be more persistent. He is not aware of his A. fib. He is on Xarelto and is been compliant with that medication. He denies any bleeding problems.   12/24/2016  [name] returns to for follow-up. Initially blood pressure was elevated 166/60 became and 132/64. He has managed to lose about 15 pounds which I commended him on. He denies any chest pain or worsening shortness of breath. He remains in atrial fibrillation with controlled ventricular response of 61. He denies any bleeding problems on Xarelto.  12/15/2017  [name] returns today for annual follow-up.  He has been struggling with lower extremity wound infections.  He is undergoing penicillin injections and wound care management.  He denies any chest pain or worsening shortness of breath however has had about a 12 pound weight gain since I last saw him a year ago.  He says he is going to be working on dietary changes however he is noted  significant swelling of his legs.  There was a suggestion that he may be volume overloaded and he does report some symptoms that sound consistent with orthopnea.  He last had an echo which showed systolic function and 2017.  01/16/2018  [name] was seen today in follow-up.  He reports improvement in his edema.  I increased his Lasix to 80 mg twice daily.  He is lost about 4 pounds since I last saw him.  He denies any worsening shortness of breath.  He continues to struggle with  lower extremity wounds that are slowly healing.  Weight is a significant issue.  An echo was performed which showed normal systolic function and mild RV dysfunction.  Most of his symptoms are likely attributable to that.  11/26/2018  [name] is seen today in follow-up.  Overall he is doing well.  Recently he is lost significant amount of weight.  He was seen by the wound care center who noted that he was put tensive and had a low heart rate.  His diltiazem was decreased from 360 mg to 240 mg daily and his metoprolol was decreased from 75 mg twice daily to 50 mg twice daily.  Today's blood pressure is improved however heart rate remains low at 44.  He seems to be asymptomatic with this.  Denies any worsening shortness of breath or chest pain.  07/05/2019  [name] returns today for follow-up.  He continues to get care in the wound care center.  Unfortunately as he had been losing weight the weight has now come back.  He denies any chest pain or worsening shortness of breath.  His blood pressure appears to be well controlled at home although is a little elevated today.  He has had no significant's symptoms related to A. fib but does remain in persistent A. fib which is rate controlled at 67 today.  12/06/2019  [name] is seen today in follow-up.  He recently has had additional weight gain now up about 10 pounds.  He has had significant swelling.  He is followed very closely at the wound care center however was referred back due to significant volume retention.  Most of this is likely related to right heart failure.  Currently he is supposed to be on 80 mg Lasix twice daily.  After talking with him he said "I only take 2 tablets by mouth daily or 80 mg once daily".. This is of course half the dose that we would recommend.  PMHx:  Past Medical History:  Diagnosis Date  . ALLERGIC RHINITIS 08/11/2006  . ASTHMA 08/11/2006  . Chronic atrial fibrillation (HCC) 08/2014  . Chronic combined systolic and  diastolic CHF (congestive heart failure) [facility])    Cardiology f/u 12/2017: pt volume overloaded (R heart dysf suspected), BNP very high, lasix increased.  Repeat echo 12/2017: normal LV EF, mild DD, +RV syst dysfxn, mod pulm HTN, biatrial enlgmt.  . Chronic constipation   . Chronic renal insufficiency, stage 2 (mild)    Borderline stage III (GFR 60s).  . DIABETES MELLITUS, TYPE II 08/11/2006  . DM W/RENAL MANIFESTATIONS, TYPE II 04/20/2007  . HYPERTENSION 08/11/2006  . MRSA infection 06/2018   LL venous stasis ulcer infected  . Normocytic anemia 2016-2019   03/2018 B12 normal, iron ok (ferritin borderline low).  Plan repeat CBC at f/u summer 2019 and if decreased from baseline will check hemoccults and iron labs again.  . OBESITY, MORBID 12/14/2007  . OSA (obstructive sleep apnea)  to get sleep study with [facility] as of 12/03/2018 consult.  . 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  [id number] ID started him on amoxil prophylaxis---ineffective.  End 2018/Jan 2019 penicillin G injections prophyl helpful but pt declined to continue this as of 01/2018 ID f/u.  ID talked him into resuming monthly penicillin G as of 02/2018 f/u.    Marland Kitchen. Restless leg syndrome    Rx'd clonazepam 09/2017 and pt refused to take it after reading the medication's potential side effects.  . Venous stasis ulcers of both lower extremities (HCC)    Severe lymphedema.  wound clinic care ongoing as of 01/2018    Past Surgical History:  Procedure Laterality Date  . Carotid dopplers  07/23/2018   Left NORMAL.  Right 1-39% ICA stenosis, with <50% distal CCA stenosis (not hemodynamically significant)  . TRANSTHORACIC ECHOCARDIOGRAM  11/2007; 09/2014; 11/2015;12/2017   LV fxn normal, EF normal, mild dilation of left atrium.  2015 grade II diast dysfxn.  2017 EF 55-60%. 2019: LVEF 60-65%, mild RV syst dysf,biatrial enlgmt, mod pulm htn.  Marland Kitchen. URETERAL STENT  PLACEMENT    . virtual colonoscopy  01/2011   Normal    FAMHx:  Family History  Problem Relation Age of Onset  . Diabetes Mother   . Diabetes Sister   . Hydrocephalus Sister        NPH    SOCHx:   reports that he has never smoked. He has never used smokeless tobacco. He reports that he does not drink alcohol or use drugs.  ALLERGIES:  Allergies  Allergen Reactions  . Other Other (See Comments)    Sneezing, coughing    ROS: Pertinent items noted in HPI and remainder of comprehensive ROS otherwise negative.  HOME MEDS: Current Outpatient Medications  Medication Sig Dispense Refill  . arginine 500 MG tablet Take by mouth.    Marland Kitchen. b complex vitamins capsule Take 1 capsule by mouth every morning.    . butalbital-acetaminophen-caffeine (FIORICET WITH CODEINE) 50-325-40-30 MG capsule TAKE 1 TO 2 CAPSULES BY  MOUTH EVERY 6 HOURS AS  NEEDED FOR HEADACHE(S)  MANUFACTURER RECOMMENDS NOT EXCEEDING 6 CAPSULES/DAY 30 capsule 0  . cetirizine (ZYRTEC) 10 MG tablet Take 10 mg by mouth daily.    . Coenzyme Q10 (CO Q 10) 10 MG CAPS Take by mouth. Reported on 02/19/2016    . diltiazem (TIAZAC) 240 MG 24 hr capsule TAKE 1 CAPSULE BY MOUTH  DAILY 90 capsule 0  . doxycycline (MONODOX) 100 MG capsule Take 100 mg by mouth 2 (two) times daily.    . furosemide (LASIX) 40 MG tablet TAKE 2 TABLETS BY MOUTH TWO TIMES DAILY 360 tablet 1  . Glutamine 500 MG CAPS Take by mouth.    Marland Kitchen. HUMALOG MIX 75/25 KWIKPEN (75-25) 100 UNIT/ML Kwikpen INJECT SUBCUTANEOUSLY 25  UNITS EVERY MORNING AND 20  UNITS EVERY EVENING AT  SUPPER 45 mL 3  . HYDROcodone-acetaminophen (NORCO/VICODIN) 5-325 MG tablet 1-2 tabs po bid prn pain 60 tablet 0  . Insulin Pen Needle (B-D ULTRAFINE III SHORT PEN) 31G X 8 MM MISC USE 1  TWICE DAILY 100 each 0  . Insulin Pen Needle (BD ULTRA-FINE PEN NEEDLES) 29G X 12.7MM MISC 1 each by Other route 2 (two) times daily. 200 each 2  . Insulin Pen Needle (BD ULTRA-FINE PEN NEEDLES) 29G X 12.7MM MISC USE  TWICE DAILY 200 each 3  . lisinopril (ZESTRIL) 40 MG tablet  TAKE 1 TABLET BY MOUTH  DAILY 90 tablet 0  . metFORMIN (GLUCOPHAGE) 1000 MG tablet TAKE 1 TABLET BY MOUTH  TWICE DAILY WITH MEALS 180 tablet 0  . metoprolol tartrate (LOPRESSOR) 50 MG tablet TAKE 1 AND 1/2 TABLETS BY  MOUTH TWO TIMES DAILY 270 tablet 0  . Multiple Vitamin (MULTIVITAMIN) tablet Take 1 tablet by mouth daily.    [name] ULTRA test strip CHECK BLOOD SUGAR TWO TIMES DAILY 200 strip 3  . rivaroxaban (XARELTO) 20 MG TABS tablet TAKE 1 TABLET BY MOUTH ONCE DAILY WITH SUPPER 90 tablet 3  . saw palmetto 500 MG capsule Take 500 mg by mouth daily.    Marland Kitchen terazosin (HYTRIN) 10 MG capsule TAKE 1 CAPSULE BY MOUTH  ONCE A DAY AT BEDTIME 90 capsule 3  . vitamin E 400 UNIT capsule Take 400 Units by mouth every morning. Selenium 50mg      Current Facility-Administered Medications  Medication Dose Route Frequency Provider Last Rate Last Admin  . penicillin g benzathine (BICILLIN LA) 1200000 UNIT/2ML injection 600,000 Units  600,000 Units Intramuscular Q30 days , MD   600,000 Units at 04/05/19 0905  . penicillin g benzathine (BICILLIN LA) 1200000 UNIT/2ML injection 600,000 Units  600,000 Units Intramuscular Q30 days 06/05/19, MD   600,000 Units at 04/05/19 0905  . penicillin g benzathine (BICILLIN LA) 1200000 UNIT/2ML injection 600,000 Units  600,000 Units Intramuscular Q30 days 06/05/19, MD   600,000 Units at 11/10/19 [phone]  . penicillin g benzathine (BICILLIN LA) 1200000 UNIT/2ML injection 600,000 Units  600,000 Units Intramuscular Q30 days 7902, MD   600,000 Units at 11/10/19 01/08/20    LABS/IMAGING: No results found for this or any previous visit (from the past 48 hour(s)). No results found.  VITALS: BP (!) 141/63   Pulse 76   Ht 5\' 10"  (1.778 m)   Wt (!) 393 lb 3.2 oz (178.4 kg)   SpO2 94%   BMI 56.42 kg/m   EXAM: General appearance: alert, no distress and morbidly obese Neck: no  carotid bruit, no JVD and thyroid not enlarged, symmetric, no tenderness/mass/nodules Lungs: clear to auscultation bilaterally Heart: regular rate and rhythm, S1, S2 normal, no murmur, click, rub or gallop Abdomen: soft, non-tender; bowel sounds normal; no masses,  no organomegaly Extremities: edema 2+ bilateral edema, the legs are wrapped Pulses: 2+ and symmetric Skin: Skin color, texture, turgor normal. No rashes or lesions Neurologic: Alert and oriented X 3, normal strength and tone. Normal symmetric reflexes. Normal coordination and gait Psych: Pleasant  EKG: Deferred  ASSESSMENT: 1. Longstanding persistent atrial fibrillation 2. Acute on chronic combined systolic and diastolic heart failure (LVEF 55-60% in 11/2015) 3. PAF-CHADSVASC score of 3 4. Hypertension 5. Morbid obesity 6. 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 Dyslipidemia  PLAN: 1.   [name] has had weight gain and significant peripheral edema.  It turns out he was only taking half the dose of Lasix that we were advising him to take.  Therefore he should increase his Lasix to 80 mg twice daily.  This will likely improve his edema to some extent however he may need additional diuresis with metolazone.  He will continue with wound care follow-up.  We will see him back in 2 months for follow-up of his edema.  12/2015, MD, [facility], FACP  [place]  [facility]  Medical Director of the [facility]  Card[facility]ovascular [facility] Reduction [facility] Diplomate  of the [employer] of Clinical Lipidology Attending Cardiologist  Direct Dial: [phone]  Fax: [phone]  Website:  [email or web address] 12/06/2019, 3:18 PM

## 2019-12-07 ENCOUNTER — Encounter: Payer: Self-pay | Admitting: Internal Medicine

## 2019-12-08 ENCOUNTER — Ambulatory Visit (INDEPENDENT_AMBULATORY_CARE_PROVIDER_SITE_OTHER): Payer: Medicare Other | Admitting: Family Medicine

## 2019-12-08 ENCOUNTER — Encounter (HOSPITAL_BASED_OUTPATIENT_CLINIC_OR_DEPARTMENT_OTHER): Payer: Medicare Other | Admitting: Physician Assistant

## 2019-12-08 ENCOUNTER — Other Ambulatory Visit: Payer: Self-pay

## 2019-12-08 DIAGNOSIS — F419 Anxiety disorder, unspecified: Secondary | ICD-10-CM | POA: Diagnosis not present

## 2019-12-08 DIAGNOSIS — L03116 Cellulitis of left lower limb: Secondary | ICD-10-CM | POA: Diagnosis not present

## 2019-12-08 DIAGNOSIS — L03119 Cellulitis of unspecified part of limb: Secondary | ICD-10-CM

## 2019-12-08 DIAGNOSIS — I10 Essential (primary) hypertension: Secondary | ICD-10-CM | POA: Insufficient documentation

## 2019-12-08 DIAGNOSIS — I87333 Chronic venous hypertension (idiopathic) with ulcer and inflammation of bilateral lower extremity: Secondary | ICD-10-CM | POA: Diagnosis not present

## 2019-12-08 DIAGNOSIS — Z6841 Body Mass Index (BMI) 40.0 and over, adult: Secondary | ICD-10-CM | POA: Diagnosis not present

## 2019-12-08 DIAGNOSIS — I890 Lymphedema, not elsewhere classified: Secondary | ICD-10-CM | POA: Diagnosis not present

## 2019-12-08 DIAGNOSIS — L97829 Non-pressure chronic ulcer of other part of left lower leg with unspecified severity: Secondary | ICD-10-CM | POA: Diagnosis not present

## 2019-12-08 DIAGNOSIS — E11621 Type 2 diabetes mellitus with foot ulcer: Secondary | ICD-10-CM | POA: Diagnosis not present

## 2019-12-08 DIAGNOSIS — E11622 Type 2 diabetes mellitus with other skin ulcer: Secondary | ICD-10-CM | POA: Diagnosis not present

## 2019-12-08 DIAGNOSIS — M109 Gout, unspecified: Secondary | ICD-10-CM | POA: Diagnosis not present

## 2019-12-08 DIAGNOSIS — L97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 right calf limited to breakdown of skin: Secondary | ICD-10-CM | POA: Diagnosis not present

## 2019-12-08 DIAGNOSIS — E669 Obesity, unspecified: Secondary | ICD-10-CM | POA: Insufficient documentation

## 2019-12-08 DIAGNOSIS — I482 Chronic atrial fibrillation, unspecified: Secondary | ICD-10-CM | POA: Diagnosis not present

## 2019-12-08 DIAGNOSIS — L97221 Non-pressure chronic ulcer of left calf limited to breakdown of skin: Secondary | ICD-10-CM | POA: Insufficient documentation

## 2019-12-08 DIAGNOSIS — E1140 Type 2 diabetes mellitus with diabetic neuropathy, unspecified: Secondary | ICD-10-CM | POA: Insufficient documentation

## 2019-12-08 DIAGNOSIS — L97529 Non-pressure chronic ulcer of other part of left foot with unspecified severity: Secondary | ICD-10-CM | POA: Diagnosis not present

## 2019-12-08 DIAGNOSIS — I87312 Chronic venous hypertension (idiopathic) with ulcer of left lower extremity: Secondary | ICD-10-CM | POA: Diagnosis not present

## 2019-12-08 DIAGNOSIS — L97119 Non-pressure chronic ulcer of right thigh with unspecified severity: Secondary | ICD-10-CM | POA: Diagnosis not present

## 2019-12-08 NOTE — Progress Notes (Addendum)
[name], [name] ([id number]) Visit Report for 12/08/2019 Arrival Information Details Patient Name: Date of Service: [name], [name] 12/08/2019 10:15 AM Medical Record [id number] Patient Account Number: [id number] Date of Birth/Sex: Treating RN: 1951-10-02 (69 y.o. [name] Primary Care Klyde Banka: [name] Other Clinician: Referring Lauralye Kinn: Treating Kida Digiulio/Extender:[name], Dema Severin, [name] in Treatment: 81 Visit Information History Since Last Visit Walker Added or deleted any medications: No Patient Arrived: Any new allergies or adverse reactions: No Arrival Time: 10:30 Had a fall or experienced change in No Accompanied By: self activities of daily living that may affect Transfer Assistance: None risk of falls: Patient Identification Verified: Yes Signs or symptoms of abuse/neglect since last No Secondary Verification Process Completed: Yes visito Patient Requires Transmission-Based No Hospitalized since last visit: No Precautions: Implantable device outside of the clinic excluding No Patient Has Alerts: Yes cellular tissue based products placed in the center since last visit: Has Dressing in Place as Prescribed: Yes Has Compression in Place as Prescribed: Yes Pain Present Now: No Electronic Signature(s) Signed: 12/08/2019 5:54:18 PM By: Deon Pilling Entered By: Deon Pilling on 12/08/2019 10:35:09 -------------------------------------------------------------------------------- Compression Therapy Details Patient Name: Date of Service: [name], [name] 12/08/2019 10:15 AM Medical Record [id number] Patient Account Number: [id number] Date of Birth/Sex: Treating RN: Jun 20, 1951 (69 y.o. [name] Primary Care Caelen Higinbotham: [name] Other Clinician: Referring Marthann Abshier: Treating Nahuel Wilbert/Extender:[name], Dema Severin, [name] in Treatment: 202 Compression Therapy Performed for Wound NonWound Condition Lymphedema -  Right Leg Assessment: Performed By: Clinician [name], RN Compression Type: [name] Post Procedure Diagnosis Same as Pre-procedure Electronic Signature(s) Signed: 12/08/2019 6:36:45 PM By: Baruch Gouty RN, BSN Entered By: Baruch Gouty on 12/08/2019 10:57:56 -------------------------------------------------------------------------------- Compression Therapy Details Patient Name: Date of Service: [name], [name] 12/08/2019 10:15 AM Medical Record [id number] Patient Account Number: [id number] Date of Birth/Sex: Treating RN: [birth year] (69 y.o. [name] Primary Care Makeda Peeks: [name] Other Clinician: Referring Crit Obremski: Treating Mason Burleigh/Extender:[name], Dema Severin, [name] in Treatment: 202 Compression Therapy Performed for Wound Wound #156 Left,Dorsal Foot Assessment: Performed By: Clinician [name], RN Compression Type: Four Layer Post Procedure Diagnosis Same as Pre-procedure Electronic Signature(s) Signed: 12/08/2019 6:36:45 PM By: Baruch Gouty RN, BSN Entered By: Baruch Gouty on 12/08/2019 10:58:23 -------------------------------------------------------------------------------- Encounter Discharge Information Details Patient Name: Date of Service: [name], [name] 12/08/2019 10:15 AM Medical Record [id number] Patient Account Number: [id number] Date of Birth/Sex: Treating RN: 08/02/1951 (69 y.o. [name] Primary Care Sehaj Mcenroe: [name] Other Clinician: Referring Esly Selvage: Treating Barbra Miner/Extender:[name], Dema Severin, [name] in Treatment: 202 Encounter Discharge Information Items Discharge Condition: Stable Ambulatory Status: Walker Discharge Destination: Home Transportation: Private Auto Accompanied By: self Schedule Follow-up Appointment: Yes Clinical Summary of Care: Electronic Signature(s) Signed: 12/08/2019 5:54:18 PM By: Deon Pilling Entered By: Deon Pilling on 12/08/2019  11:56:08 -------------------------------------------------------------------------------- Lower Extremity Assessment Details Patient Name: Date of Service: [name], [name] 12/08/2019 10:15 AM Medical Record [id number] Patient Account Number: [id number] Date of Birth/Sex: Treating RN: 27-Nov-1950 (69 y.o. [name] Primary Care Shaylinn Hladik: [name] Other Clinician: Referring Nariyah Osias: Treating Khadija Thier/Extender:[name], Dema Severin, [name] in Treatment: 202 Edema Assessment Assessed: [Left: No] [Right: No] Edema: [Left: Yes] [Right: Yes] Calf Left: Right: Point of Measurement: 31 cm From Medial Instep 41 cm 42 cm Ankle Left: Right: Point of Measurement: 12 cm From Medial Instep 25.5 cm 26 cm Electronic Signature(s) Signed: 12/08/2019 5:54:18 PM By: Deon Pilling Entered By: Deon Pilling on 12/08/2019 10:40:57 -------------------------------------------------------------------------------- [place]  Details Patient Name: Date of Service: [name], [name] 12/08/2019 10:15 AM Medical Record [id number] Patient Account Number: [id number] Date of Birth/Sex: Treating RN: 07-31-1951 (69 y.o. [name] Primary Care Mamoru Takeshita: [name] Other Clinician: Referring Landan Fedie: Treating Deloros Beretta/Extender:[name], Dema Severin, [name] in Treatment: 202 Active Inactive Venous Leg Ulcer Nursing Diagnoses: Actual venous Insuffiency (use after diagnosis is confirmed) Goals: Patient will maintain optimal edema control Date Initiated: 09/10/2016 Target Resolution Date: 12/29/2019 Goal Status: Active Verify adequate tissue perfusion prior to therapeutic compression application Date Initiated: 09/10/2016 Date Inactivated: 11/28/2016 Goal Status: Met Interventions: Assess peripheral edema status every visit. Compression as ordered Provide education on venous insufficiency Notes: edema not contolled above wraps, pt not using  lymoh pumps regularly Wound/Skin Impairment Nursing Diagnoses: Impaired tissue integrity Goals: Patient/caregiver will verbalize understanding of skin care regimen Date Initiated: 09/10/2016 Target Resolution Date: 12/29/2019 Goal Status: Active Interventions: Assess patient/caregiver ability to perform ulcer/skin care regimen upon admission and as needed Assess ulceration(s) every visit Provide education on ulcer and skin care Notes: Electronic Signature(s) Signed: 12/08/2019 6:36:45 PM By: Baruch Gouty RN, BSN Entered By: Baruch Gouty on 12/08/2019 10:55:54 -------------------------------------------------------------------------------- Pain Assessment Details Patient Name: Date of Service: [name] [place] 12/08/2019 10:15 AM Medical Record [id number] Patient Account Number: [id number] Date of Birth/Sex: Treating RN: [birth year] (69 y.o. [name] Primary Care Ovida Delagarza: [name] Other Clinician: Referring Sueko Dimichele: Treating Yudith Norlander/Extender:[name], Dema Severin, [name] in Treatment: 202 Active Problems Location of Pain Severity and Description of Pain Patient Has Paino No Site Locations Rate the pain. Current Pain Level: 0 Pain Management and Medication Current Pain Management: Medication: No Cold Application: No Rest: No Massage: No Activity: No T.E.N.S.: No Heat Application: No Leg drop or elevation: No Is the Current Pain Management Adequate: Adequate How does your wound impact your activities of daily livingo Sleep: No Bathing: No Appetite: No Relationship With Others: No Bladder Continence: No Emotions: No Bowel Continence: No Work: No Toileting: No Drive: No Dressing: No Hobbies: No Electronic Signature(s) Signed: 12/08/2019 5:54:18 PM By: Deon Pilling Entered By: Deon Pilling on 12/08/2019 10:36:14 -------------------------------------------------------------------------------- Patient/Caregiver Education  Details Patient Name: Date of Service: [name] [place] 2/3/2021andnbsp10:15 AM Medical Record [id number] Patient Account Number: [id number] Date of Birth/Gender: Treating RN: 1951/07/09 (69 y.o. [name] Primary Care Physician: [name] Other Clinician: Referring Physician: Treating Physician/Extender:[name], Dema Severin, [name] in Treatment: 83 Education Assessment Education Provided To: Patient Education Topics Provided Venous: Methods: Explain/Verbal Responses: Reinforcements needed, State content correctly Wound/Skin Impairment: Methods: Explain/Verbal Responses: Reinforcements needed, State content correctly Electronic Signature(s) Signed: 12/08/2019 6:36:45 PM By: Baruch Gouty RN, BSN Entered By: Baruch Gouty on 12/08/2019 10:56:28 -------------------------------------------------------------------------------- Wound Assessment Details Patient Name: Date of Service: [name], [name] 12/08/2019 10:15 AM Medical Record [id number] Patient Account Number: [id number] Date of Birth/Sex: Treating RN: 1951/04/25 (69 y.o. [name] Primary Care Kaylin Schellenberg: [name] Other Clinician: Referring Breely Panik: Treating Kelsie Zaborowski/Extender:[name], Dema Severin, [name] in Treatment: 202 Wound Status Wound Number: [id number] Primary Lymphedema Etiology: Wound Location: Right Upper Leg - Medial Wound Open Wounding Event: Gradually Appeared Status: Date Acquired: 09/12/2019 Comorbid Chronic sinus problems/congestion, Weeks Of Treatment: 12 History: Arrhythmia, Hypertension, Peripheral Arterial Clustered Wound: Yes Disease, Type II Diabetes, History of Burn, Gout, Confinement Anxiety Photos Wound Measurements Length: (cm) 7 Width: (cm) 3 Depth: (cm) 0.1 Clustered Quantity: 8 Area: (cm) 16.493 Volume: (cm) 1.649 Wound Description Classification: Partial Thickness Wound Margin: Flat and Intact Exudate Amount:  Large Exudate Type: Serosanguineous  Exudate Color: red, brown Wound Bed Granulation Amount: Large (67-100%) Granulation Quality: Red Necrotic Amount: None Present (0%) After Cleansing: No brino No Exposed Structure osed: No (Subcutaneous Tissue) Exposed: No osed: No osed: No sed: No ed: No % Reduction in Area: -1300.1% % Reduction in Volume: -1297.5% Epithelialization: Medium (34-66%) Tunneling: No Undermining: No Foul Odor Slough/Fi Fascia Exp Fat Layer Tendon Exp Muscle Exp Joint Expo Bone Expos Treatment Notes Wound #[id number] (Right, Medial Upper Leg) 1. Cleanse With Wound Cleanser Soap and water 3. Primary Dressing Applied Calcium Alginate Ag 4. Secondary Dressing ABD Pad 5. Secured With Recruitment consultant) Signed: 12/13/2019 4:47:04 PM By: Mikeal Hawthorne EMT/HBOT Signed: 12/13/2019 5:47:02 PM By: Deon Pilling Previous Signature: 12/08/2019 5:54:18 PM Version By: Deon Pilling Entered By: Mikeal Hawthorne on 12/13/2019 15:00:19 -------------------------------------------------------------------------------- Wound Assessment Details Patient Name: Date of Service: [name], [name] 12/08/2019 10:15 AM Medical Record [id number] Patient Account Number: [id number] Date of Birth/Sex: Treating RN: [birth year] (69 y.o. [name] Primary Care Melvenia Favela: [name] Other Clinician: Referring Loye Reininger: Treating Maryam Feely/Extender:[name], Dema Severin, [name] in Treatment: 202 Wound Status Wound Number: [id number] Primary Lymphedema Etiology: Wound Location: Left Lower Leg - Medial Wound Healed - Epithelialized Wounding Event: Shear/Friction Status: Date Acquired: 11/10/2019 Comorbid Chronic sinus problems/congestion, Weeks Of Treatment: 4 History: Arrhythmia, Hypertension, Peripheral Arterial Clustered Wound: No Disease, Type II Diabetes, History of Burn, Gout, Confinement Anxiety Photos Wound Measurements Length: (cm) 0 % Reduct Width: (cm)  0 % Reduct Depth: (cm) 0 Epitheli Area: (cm) 0 Volume: (cm) 0 Wound Description Full Thickness Without Exposed Support Foul Od Classification: Structures Slough/ Wound Distinct, outline attached Margin: Exudate Medium Amount: Exudate Serosanguineous Type: Exudate red, brown Color: Wound Bed Granulation Amount: Large (67-100%) Granulation Quality: Red Fascia Necrotic Amount: None Present (0%) Fat Lay Tendon Muscle Joint E Bone Ex or After Cleansing: No Fibrino No Exposed Structure Exposed: No er (Subcutaneous Tissue) Exposed: No Exposed: No Exposed: No xposed: No posed: No ion in Area: 100% ion in Volume: 100% alization: Small (1-33%) Electronic Signature(s) Signed: 12/13/2019 4:47:04 PM By: Mikeal Hawthorne EMT/HBOT Signed: 12/13/2019 5:47:02 PM By: Deon Pilling Previous Signature: 12/08/2019 5:54:18 PM Version By: Deon Pilling Entered By: Mikeal Hawthorne on 12/13/2019 14:59:43 -------------------------------------------------------------------------------- Wound Assessment Details Patient Name: Date of Service: [name] [place] 12/08/2019 10:15 AM Medical Record [id number] Patient Account Number: [id number] Date of Birth/Sex: Treating RN: April 10, 1951 (69 y.o. M) Primary Care Matvey Llanas: [name] Other Clinician: Referring Daniella Dewberry: Treating Isahi Godwin/Extender:[name], Dema Severin, [name] in Treatment: 202 Wound Status Wound Number: [id number] Primary Diabetic Wound/Ulcer of the Lower Extremity Etiology: Wound Location: Left Foot - Dorsal Wound Open Wounding Event: Blister Status: Date Acquired: 11/24/2019 Comorbid Chronic sinus problems/congestion, Weeks Of Treatment: 2 History: Arrhythmia, Hypertension, Peripheral Arterial Clustered Wound: No Disease, Type II Diabetes, History of Burn, Gout, Confinement Anxiety Photos Wound Measurements Length: (cm) 0.7 % Reduction i Width: (cm) 0.8 % Reduction i Depth: (cm) 0.1 Epithelializa Area: (cm)  0.44 Tunneling: Volume: (cm) 0.044 Undermining: Wound Description Classification: Grade 1 Foul Odor Af Wound Margin: Distinct, outline attached Slough/Fibri Exudate Amount: Medium Exudate Type: Serosanguineous Exudate Color: red, brown Wound Bed Granulation Amount: Large (67-100%) Granulation Quality: Red, Pink Fascia Expose Necrotic Amount: None Present (0%) Fat Layer (Su Tendon Expose Muscle Expose Joint Exposed Bone Exposed: ter Cleansing: No no No Exposed Structure d: No bcutaneous Tissue) Exposed: No d: No d: No : No No n Area: 60% n Volume: 60% tion: Large (67-100%) No No Treatment Notes Wound #156 (  Left, Dorsal Foot) 1. Cleanse With Wound Cleanser Soap and water 2. Periwound Care Moisturizing lotion TCA Cream 3. Primary Dressing Applied Calcium Alginate Ag 4. Secondary Dressing Dry Gauze Foam 6. Support Layer Applied 4 layer compression wrap Notes first layer of unna boot used to secure compression system in place. foam at foot for protection. Electronic Signature(s) Signed: 12/13/2019 4:47:04 PM By: Mikeal Hawthorne EMT/HBOT Previous Signature: 12/08/2019 5:54:18 PM Version By: Deon Pilling Entered By: Mikeal Hawthorne on 12/13/2019 14:56:35 -------------------------------------------------------------------------------- Vitals Details Patient Name: Date of Service: [name], [name] 12/08/2019 10:15 AM Medical Record [id number] Patient Account Number: [id number] Date of Birth/Sex: Treating RN: 07-29-1951 (69 y.o. [name] Primary Care Sharonne Ricketts: [name] Other Clinician: Referring Dvon Jiles: Treating Tevis Conger/Extender:[name], Dema Severin, [name] in Treatment: 202 Vital Signs Time Taken: 10:31 Temperature (F): 97.7 Height (in): 70 Pulse (bpm): 61 Weight (lbs): 380.2 Respiratory Rate (breaths/min): 20 Body Mass Index (BMI): 54.5 Blood Pressure (mmHg): 138/62 Capillary Blood Glucose (mg/dl): 136 Reference Range: 80  - 120 mg / dl Electronic Signature(s) Signed: 12/08/2019 5:54:18 PM By: Deon Pilling Entered By: Deon Pilling on 12/08/2019 10:36:04

## 2019-12-08 NOTE — Progress Notes (Signed)
[name] is a 69 y.o. male presents to the office today for bicillin injections, per physician's orders.  Bicillin 600,000 Units was administered bilaterally in Right and Left upper outter quad today. Patient tolerated injection. Patient due for follow up labs/provider appt: Yes. Date due: 01/05/20, appt made Yes  Misty Stanley, CMA

## 2019-12-08 NOTE — Progress Notes (Addendum)
[name], [name] ([id number]) Visit Report for 12/08/2019 Chief Complaint Document Details Patient Name: Date of Service: APOLONIO, CUTTING 12/08/2019 10:15 AM Medical Record [id number] Patient Account Number: [id number] Date of Birth/Sex: Treating RN: 1951/10/20 (69 y.o. M) Primary Care Provider: Nicoletta Ba Other Clinician: Referring Provider: Treating Provider/Extender:[name], [name], [name] in Treatment: 202 Information Obtained from: Patient Chief Complaint patient is here for evaluation venous/lymphedema weeping Electronic Signature(s) Signed: 12/08/2019 10:57:50 AM By: Lenda Kelp PA-C Entered By: Lenda Kelp on 12/08/2019 10:57:50 -------------------------------------------------------------------------------- HPI Details Patient Name: Date of Service: [name], [name] 12/08/2019 10:15 AM Medical Record [id number] Patient Account Number: [id number] Date of Birth/Sex: Treating RN: 11/22/1950 (70 y.o. M) Primary Care Provider: Nicoletta Ba Other Clinician: Referring Provider: Treating Provider/Extender:[name], [name], [name] in Treatment: 202 History of Present Illness HPI Description: Referred by PCP for consultation. Patient has long standing history of BLE venous stasis, no prior ulcerations. At beginning of month, developed cellulitis and weeping. Received IM Rocephin followed by Keflex and resolved. Wears compression stocking, appr 6 months old. Not sure strength. No present drainage. 01/22/16 this is a patient who is a type II diabetic on insulin. 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 He tells me he religiously wears pressure stockings of uncertain strength. He was here with weeping edema about 8 months ago but did not have an open wound. Roughly a month ago he had a reopening on his bilateral legs. He is been using bandages and Neosporin. He does not complain of pain. 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 Last BUN/creatinine I have is from 11/20/15 at 13 and 1.0 respectively 01/29/16; patient arrives today having tolerated the Profore wrap. He brought in his stockings and these are 18 mmHg stockings he bought from [place]. The compression here is likely inadequate. He does not complain of pain or excessive drainage she has no systemic symptoms. The wound on the right looks improved as does the one on the left although one on the left is more substantial with still tissue at risk below the actual wound area on the bilateral posterior calf 02/05/16; patient arrives with poor edema control. He states that we did put a 4 layer compression on it last week. No weight appear 5 this. 02/12/16; the area on the posterior right Has healed. The left Has a substantial wound that has necrotic surface eschar that requires a debridement with a curette. 02/16/16;the patient called or a Nurse visit secondary to increased swelling. He had been in earlier in the week with his right leg healed. He was transitioned to is on pressure stocking on the right leg with the only open wound on the left, a substantial area on the left posterior calf. Note he has a history of severe lower extremity edema, he has a history of chronic atrial fibrillation but not heart failure per my notes but I'll need to research this. He is not complaining of chest pain shortness of breath or orthopnea. The intake nurse noted blisters on the previously closed right leg 02/19/16; this is the patient's regular visit day. I see him on Friday with escalating edema new wounds on the right leg and clear signs of at least right ventricular heart failure. I increased his Lasix to 40 twice a day. He is returning currently in follow-up. States he is noticed a decrease  in that the edema 02/26/16 patient's legs have much less  edema. There is nothing really open on the right leg. The left leg has improved condition of the large superficial wound on the posterior left leg 03/04/16; edema control is very much better. The patient's right leg wounds have healed. On the left leg he continues to have severe venous inflammation on the posterior aspect of the left leg. There is no tenderness and I don't think any of this is cellulitis. 03/11/16; patient's right leg is married healed and he is in his own stocking. The patient's left leg has deteriorated somewhat. There is a lot of erythema around the wound on the posterior left leg. There is also a significant rim of erythema posteriorly just above where the wrap would've ended there is a new wound in this location and a lot of tenderness. Can't rule out cellulitis in this area. 03/15/16; patient's right leg remains healed and he is in his own stocking. The patient's left leg is much better than last review. His major wound on the posterior aspect of his left Is almost fully epithelialized. He has 3 small injuries from the wraps. Really. Erythema seems a lot better on antibiotics 03/18/16; right leg remains healed and he is in his own stocking. The patient's left leg is much better. The area on the posterior aspect of the left calf is fully epithelialized. His 3 small injuries which were wrap injuries on the left are improved only one seems still open his erythema has resolved 03/25/16; patient's right leg remains healed and he is in his own stocking. There is no open area today on the left leg posterior leg is completely closed up. His wrap injuries at the superior aspect of his leg are also resolved. He looks as though he has some irritation on the dorsal ankle but this is fully epithelialized without evidence of infection. 03/28/16; we discharged this patient on Monday. Transitioned him into his own stocking. There were problems almost immediately with uncontrolled swelling weeping  edema multiple some of which have opened. He does not feel systemically unwell in particular no chest pain no shortness of breath and he does not feel 04/08/16; the edema is under better control with the Profore light wrap but he still has pitting edema. There is one large wound anteriorly 2 on the medial aspect of his left leg and 3 small areas on the superior posterior calf. Drainage is not excessive he is tolerating a Profore light well 04/15/16; put a Profore wrap on him last week. This is controlled is edema however he had a lot of pain on his left anterior foot most of his wounds are healed 04/22/16 once again the patient has denuded areas on the left anterior foot which he states are because his wrap slips up word. He saw his primary physician today is on Lasix 40 twice a day and states that he his weight is down 20 pounds over the last 3 months. 04/29/16: Much improved. left anterior foot much improved. He is now on Lasix 80 mg per day. Much improved edema control 05/06/16; I was hoping to be able to discharge him today however once again he has blisters at a low level of where the compression was placed last week mostly on his left lateral but also his left medial leg and a small area on the anterior part of the left foot. 05/09/16; apparently the patient went home after his appointment on 7/4 later in the evening developing pain  in his upper medial thigh together with subjective fever and chills although his temperature was not taken. The pain was so intense he felt he would probably have to call 911. However he then remembered that he had leftover doxycycline from a previous round of antibiotics and took these. By the next morning he felt a lot better. He called and spoke to one of our nurses and I approved doxycycline over the phone thinking that this was in relation to the wounds we had previously seen although they were definitely were not. The patient feels a lot better old fever no chills  he is still working. Blood sugars are reasonably controlled 05/13/16; patient is back in for review of his cellulitis on his anterior medial upper thigh. He is taking doxycycline this is a lot better. Culture I did of the nodular area on the dorsal aspect of his foot grew MRSA this also looks a lot better. 05/20/16; the patient is cellulitis on the medial upper thigh has resolved. All of his wound areas including the left anterior foot, areas on the medial aspect of the left calf and the lateral aspect of the calf at all resolved. He has a new blister on the left dorsal foot at the level of the fourth toe this was excised. No evidence of infection 05/27/16; patient continues to complain weeping edema. He has new blisterlike wounds on the left anterior lateral and posterior lateral calf at the top of his wrap levels. The area on his left anterior foot appears better. He is not complaining of fever, pain or pruritus in his feet. 05/30/16; the patient's blisters on his left anterior leg posterior calf all look improved. He did not increase the Lasix 100 mg as I suggested because he was going to run out of his 40 mg tablets. He is still having weeping edema of his toes 06/03/16; I renewed his Lasix at 80 mg once a day as he was about to run out when I last saw him. He is on 80 mg of Lasix now. I have asked him to cut down on the excessive amount of water he was drinking and asked him to drink according to his thirst mechanisms 06/12/2016 -- was seen 2 days ago and was supposed to wear his compression stockings at home but he is developed lymphedema and superficial blisters on the left lower extremity and hence came in for a review 06/24/16; the remaining wound is on his left anterior leg. He still has edema coming from between his toes. There is lymphedema here however his edema is generally better than when I last saw this. He has a history of atrial fibrillation but does not have a known history of  congestive heart failure nevertheless I think he probably has this at least on a diastolic basis. 07/01/16 I reviewed his echocardiogram from January 2017. This was essentially normal. He did not have LVH, EF of 55-60%. His right ventricular function was normal although he did have trivial tricuspid and pulmonic regurgitation. This is not audible on exam however. I increased his Lasix to do massive edema in his legs well above his knees I think in early July. He was also drinking an excessive amount of water at the time. 07/15/16; missed his appointment last week because of the Labor Day holiday on Monday. He could not get another appointment later in the week. Started to feel the wrap digging in superiorly so we remove the top half and the bottom half of his wrap.  He has extensive erythema and blistering superiorly in the left leg. Very tender. Very swollen. Edema in his foot with leaking edema fluid. He has not been systemically unwell 07/22/16; the area on the left leg laterally required some debridement. The medial wounds look more stable. His wrap injury wounds appear to have healed. Edema and his foot is better, weeping edema is also better. He tells me he is meeting with the supplier of the external compression pumps at work 08/05/16; the patient was on vacation last week in [facility]. His wrap is been on for an extended period of time. Also over the weekend he developed an extensive area of tender erythema across his anterior medial thigh. He took to doxycycline yesterday that he had leftover from a previous prescription. The patient complains of weeping edema coming out of his toes 08/08/16; I saw this patient on 10/2. He was tender across his anterior thigh. I put him on doxycycline. He returns today in follow-up. He does not have any open wounds on his lower leg, he still has edema weeping into his toes. 08/12/16; patient was seen back urgently today to follow-up for his extensive left  thigh cellulitis/erysipelas. He comes back with a lot less swelling and erythema pain is much better. I believe I gave him Augmentin and Cipro. His wrap was cut down as he stated a roll down his legs. He developed blistering above the level of the wrap that remained. He has 2 open blisters and 1 intact. 08/19/16; patient is been doing his primary doctor who is increased his Lasix from 40-80 once a day or 80 already has less edema. Cellulitis has remained improved in the left thigh. 2 open areas on the posterior left calf 08/26/16; he returns today having new open blisters on the anterior part of his left leg. He has his compression pumps but is not yet been shown how to use some vital representative from the supplier. 09/02/16 patient returns today with no open wounds on the left leg. Some maceration in his plantar toes 09/10/2016 -- Dr. Leanord Hawking had recently discharged him on 09/02/2016 and he has come right back with redness swelling and some open ulcers on his left lower extremity. He says this was caused by trying to apply his compression stockings and he's been unable to use this and has not been able to use his lymphedema pumps. He had some doxycycline leftover and he has started on this a few days ago. 09/16/16; there are no open wounds on his leg on the left and no evidence of cellulitis. 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 He does not complain of symptoms here. I am not clear use using his external compression pumps. 09/23/16; I have not seen this patient in 2 weeks. He canceled his appointment 10 days ago as he was going on vacation. He tells me that on Monday he noticed a large area on his posterior left leg which is been draining copiously and is reopened into a large wound. He is been using ABDs and the external part of his juxtalite, according to our nurse this was not on properly. 10/07/16; Still a substantial area on the  posterior left leg. Using silver alginate 10/14/16; in general better although there is still open area which looks healthy. Still using silver alginate. He reminds me that this happen before he left for [facility]. Today while he was showering in the morning. He had been using  his juxtalite's 10/21/16; the area on his posterior left leg is fully epithelialized. However he arrives today with a large area of tender erythema in his medial and posterior left thigh just above the knee. I have marked the area. Once again he is reluctant to consider hospitalization. I treated him with oral antibiotics in the past for a similar situation with resolution I think with doxycycline however this area it seems more extensive to me. He is not complaining of fever but does have chills and says states he is thirsty. His blood sugar today was in the 140s at home 10/25/16 the area on his posterior left leg is fully epithelialized although there is still some weeping edema. The large area of tenderness and erythema in his medial and posterior left thigh is a lot less tender although there is still a lot of swelling in this thigh. He states he feels a lot better. He is on doxycycline and Augmentin that I started last week. This will continued until Tuesday, December 26. I have ordered a duplex ultrasound of the left thigh rule out DVT whether there is an abscess something that would need to be drained I would also like to know. 11/01/16; he still has weeping edema from a not fully epithelialized area on his left posterior calf. Most of the rest of this looks a lot better. He has completed his antibiotics. His thigh is a lot better. Duplex ultrasound did not show a DVT in the thigh 11/08/16; he comes in today with more Denuded surface epithelium from the posterior aspect of his calf. There is no real evidence of cellulitis. The superior aspect of his wrap appears to have put quite an indentation in his leg just below  the knee and this may have contributed. He does not complain of pain or fever. We have been using silver alginate as the primary dressing. The area of cellulitis in the right thigh has totally resolved. He has been using his compression stockings once a week 11/15/16; the patient arrives today with more loss of epithelium from the posterior aspect of his left calf. He now has a fairly substantial wound in this area. The reason behind this deterioration isn't exactly clear although his edema is not well controlled. He states he feels he is generally more swollen systemically. He is not complaining of chest pain shortness of breath fever. Tells me he has an appointment with his primary physician in early February. He is on 80 mg of oral Lasix a day. He claims compliance with the external compression pumps. He is not having any pain in his legs similar to what he has with his recurrent cellulitis 11/22/16; the patient arrives a follow-up of his large area on his left lateral calf. This looks somewhat better today. He came in earlier in the week for a dressing change since I saw him a week ago. He is not complaining of any pain no shortness of breath no chest pain 11/28/16; the patient arrives for follow-up of his large area on the left lateral calf this does not look better. In fact it is larger weeping edema. The surface of the wound does not look too bad. We have been using silver alginate although I'm not certain that this is a dressing issue. 12/05/16; again the patient follows up for a large wound on the left lateral and left posterior calf this does not look better. There continues to be weeping edema necrotic surface tissue. More worrisome than this once again there  is erythema below the wound involving the distal Achilles and heel suggestive of cellulitis. He is on his feet working most of the day of this is not going well. We are changing his dressing twice a week to facilitate the  drainage. 12/12/16; not much change in the overall dimensions of the large area on the left posterior calf. This is very inflamed looking. I gave him an. Doxycycline last week does not really seem to have helped. He found the wrap very painful indeed it seems to of dog into his legs superiorly and perhaps around the heel. He came in early today because the drainage had soaked through his dressings. 12/19/16- patient arrives for follow-up evaluation of his left lower extremity ulcers. He states that he is using his lymphedema pumps once daily when there is "no drainage". He admits to not using his lipedema pumps while under current treatment. His blood sugars have been consistently between 150-200. 12/26/16; the patient is not using his compression pumps at home because of the wetness on his feet. I've advised him that I think it's important for him to use this daily. He finds his feet too wet, he can put a plastic bag over his legs while he is in the pumps. Otherwise I think will be in a vicious circle. We are using silver alginate to the major area on his left posterior calf 01/02/17; the patient's posterior left leg has further of all into 3 open wounds. All of them covered with a necrotic surface. He claims to be using his compression pumps once a day. His edema control is marginal. Continue with silver alginate 01/10/17; the patient's left posterior leg actually looks somewhat better. There is less edema, less erythema. Still has 3 open areas covered with a necrotic surface requiring debridement. He claims to be using his compression pumps once a day his edema control is better 01/17/17; the patient's left posterior calf look better last week when I saw him and his wrap was changed 2 days ago. He has noted increasing pain in the left heel and arrives today with much larger wounds extensive erythema extending down into the entire heel area especially tender medially. He is not systemically unwell CBGs  have been controlled no fever. Our intake nurse showed me limegreen drainage on his AVD pads. 01/24/17; his usual this patient responds nicely to antibiotics last week giving him Levaquin for presumed Pseudomonas. The whole entire posterior part of his leg is much better much less inflamed and in the case of his Achilles heel area much less tender. He has also had some epithelialization posteriorly there are still open areas here and still draining but overall considerably better 01/31/17- He has continue to tolerate the compression wraps. he states that he continues to use the lymphedema pumps daily, and can increase to twice daily on the weekends. He is voicing no complaints or concerns regarding his LLE ulcers 02/07/17-he is here for follow-up evaluation. He states that he noted some erythema to the left medial and anterior thigh, which he states is new as of yesterday. He is concerned about recurrent cellulitis. He states his blood sugars have been slightly elevated, this morning in the 180s 02/14/17; he is here for follow-up evaluation. When he was last here there was erythema superiorly from his posterior wound in his anterior thigh. He was prescribed Levaquin however a culture of the wound surface grew MRSA over the phone I changed him to doxycycline on Monday and things seem to be a  lot better. 02/24/17; patient missed his appointment on Friday therefore we changed his nurse visit into a physician visit today. Still using silver alginate on the large area of the posterior left thigh. He isn't new area on the dorsal left second toe 03/03/17; actually better today although he admits he has not used his external compression pumps in the last 2 days or so because of work responsibilities over the weekend. 03/10/17; continued improvement. External compression pumps once a day almost all of his wounds have closed on the posterior left calf. Better edema control 03/17/17; in general improved. He still  has 3 small open areas on the lateral aspect of his left leg however most of the area on the posterior part of his leg is epithelialized. He has better edema control. He has an ABD pad under his stocking on the right anterior lower leg although he did not let us look at that today. 03/24/17; patient arrives back in clinic today with no open areas however there are areas on the posterior left calf and anterior left calf that are less than 100% epithelialized. His edema is well controlled in the left lower leg. There is some pitting edema probably lymphedema in the left upper thigh. He uses compression pumps at home once per day. I tried to get him to do this twice a day although he is very reticent. 04/01/2017 -- for the last 2 days he's had significant redness, tenderness and weeping and came in for an urgent visit today. 04/07/17; patient still has 6 more days of doxycycline. He was seen by Dr. Meyer Russel last Wednesday for cellulitis involving the posterior aspect, lateral aspect of his Involving his heel. For the most part he is better there is less erythema and less weeping. He has been on his feet for 12 hours 2 over the weekend. Using his compression pumps once a day 04/14/17 arrives today with continued improvement. Only one area on the posterior left calf that is not fully epithelialized. He has intense bilateral venous inflammation associated with his chronic venous insufficiency disease and secondary lymphedema. We have been using silver alginate to the left posterior calf wound In passing he tells [place] today that the right leg but we have not seen in quite some time has an open area on it but he doesn't want [place] to look at this today states he will show this to [place] next week. 04/21/17; there is no open area on his left leg although he still reports some weeping edema. He showed [place] his right leg today which is the first time we've seen this leg in a long time. He has a large area of open wound on the  right leg anteriorly healthy granulation. Quite a bit of swelling in the right leg and some degree of venous inflammation. He told us about the right leg in passing last week but states that deterioration in the right leg really only happened over the weekend 04/28/17; there is no open area on the left leg although there is an irritated part on the posterior which is like a wrap injury. The wound on the right leg which was new from last week at least to [place] is a lot better. 05/05/17; still no open area on the left leg. Patient is using his new compression stocking which seems to be doing a good job of controlling the edema. He states he is using his compression pumps once per day. The right leg still has an open wound although it  is better in terms of surface area. Required debridement. A lot of pain in the posterior right Achilles marked tenderness. Usually this type of presentation this patient gives concern for an active cellulitis 05/12/17; patient arrives today with his major wound from last week on the right lateral leg somewhat better. Still requiring debridement. He was using his compression stocking on the left leg however that is reopened with superficial wounds anteriorly he did not have an open wound on this leg previously. He is still using his juxta light's once daily at night. He cannot find the time to do this in the morning as he has to be at work by 7 AM 05/19/17; right lateral leg wound looks improved. No debridement required. The concerning area is on the left posterior leg which appears to almost have a subcutaneous hemorrhagic component to it. We've been using silver alginate to all the wounds 05/26/17; the right lateral leg wound continues to look improved. However the area on the left posterior calf is a tightly adherent surface. [place] using silver alginate. Because of the weeping edema in his legs there is very little good alternatives. 06/02/17; the patient left here last week  looking quite good. Major wound on the left posterior calf and a small one on the right lateral calf. Both of these look satisfactory. He tells me that by Wednesday he had noted increased pain in the left leg and drainage. He called on Thursday and Friday to get an appointment here but we were blocked. He did not go to urgent care or his primary physician. He thinks he had a fever on Thursday but did not actually take his temperature. He has not been using his compression pumps on the left leg because of pain. I advised him to go to the emergency room today for IV antibiotics for stents of left leg cellulitis but he has refused I have asked him to take 2 days off work to keep his leg elevated and he has refused this as well. In view of this I'm going to call him and Augmentin and doxycycline. 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 However after a detailed discussion regarding the risk benefits and alternatives he refuses to get admitted to the hospital. With no other recourse I will continue him on oral antibiotics as before and hopefully he'll have his infectious disease consultation this week. 06/16/2017 -- the patient was seen today by the nurse practitioner at infectious disease Ms. Dixon. 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 He has also been advise Lotrimin cream locally. she also had wise IV antibiotics for his condition if it flares up 06/23/17; patient arrives today with drainage bilaterally although the remaining wound on the left posterior calf after cleaning up  today "highlighter yellow drainage" did not look too bad. Unfortunately he has had breakdown on the right anterior leg [previously this leg had not been open and he is using a black stocking] he went to see infectious disease and is been put on clindamycin 150 mg daily, I did not verify the  dose although I'm not familiar with using clindamycin in this dosing range, perhaps for prophylaxisoo 06/27/17; I brought this patient back today to follow-up on the wound deterioration on the right lower leg together with surrounding cellulitis. I started him on doxycycline 4 days ago. This area looks better however he comes in today with intense cellulitis on the medial part of his left thigh. This is not have a wound in this area. Extremely tender. We've been using silver alginate to the wounds on the right lower leg left lower leg with bilateral 4 layer compression he is using his external compression pumps once a day 07/04/17; patient's left medial thigh cellulitis looks better. He has not been using his compression pumps as his insert said it was contraindicated with cellulitis. His right leg continues to make improvements all the wounds are still open. We only have one remaining wound on the left posterior calf. Using silver alginate to all open areas. He is on doxycycline which I started a week ago and should be finishing I gave him Augmentin after Thursday's visit for the severe cellulitis on the left medial thigh which fortunately looks better 07/14/17; the patient's left medial thigh cellulitis has resolved. The cellulitis in his right lower calf on the right also looks better. All of his wounds are stable to improved we've been using silver alginate he has completed the antibiotics I have given him. He has clindamycin 150 mg once a day prescribed by infectious disease for prophylaxis, I've advised him to start this now. We have been using bilateral Unna boots over silver alginate to the wound  areas 07/21/17; the patient is been to see infectious disease who noted his recurrent problems with cellulitis. He was not able to tolerate prophylactic clindamycin therefore he is on amoxicillin 500 twice a day. He also had a second daily dose of Lasix added By Dr. Melford Aase but he is not taking this. Nor is he being completely compliant with his compression pumps a especially not this week. He has 2 remaining wounds one on the right posterior lateral lower leg and one on the left posterior medial lower leg. 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 The patient has Unna boots bilaterally. 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 He has bilateral Unna boots for compression and silver alginate to his wounds. Arrives today with his legs looking as good as I have seen him in quite some time. Not surprisingly his wounds look better as well with improvement on the right lateral leg venous insufficiency wound and also the left medial leg. He is still using the compression pumps once a day 08/11/17; both legs appear to be doing better wounds on the right lateral and left medial legs look better. Skin on the right leg quite good. He is been using silver alginate as the primary dressing. I'm going to use Anasept gel calcium alginate and maintain all the secondary dressings 08/18/17; the patient continues to actually do quite well. The area on his right lateral leg is just about closed the left medial also looks better although it is still moist in this area. His edema is well controlled we have been using Anasept gel with calcium alginate and the usual secondary dressings, 4 layer compression and once daily use of his compression  pumps "always been able to manage 09/01/17; the  patient continues to do reasonably well in spite of his trip to [place]. The area on the right lateral leg is epithelialized. Left is much better but still open. He has more edema and more chronic erythema on the left leg [venous inflammation] 09/08/17; he arrives today with no open wound on the right lateral leg and decently controlled edema. Unfortunately his left leg is not nearly as in his good situation as last week.he apparently had increasing edema starting on Saturday. He edema soaked through into his foot so used a plastic bag to walk around his home. The area on the medial right leg which was his open area is about the same however he has lost surface epithelium on the left lateral which is new and he has significant pain in the Achilles area of the left foot. He is already on amoxicillin chronically for prophylaxis of cellulitis in the left leg 09/15/17; he is completed a week of doxycycline and the cellulitis in the left posterior leg and Achilles area is as usual improved. He still has a lot of edema and fluid soaking through his dressings. There is no open wound on the right leg. He saw infectious disease NP today 09/22/17;As usual 1 we transition him from our compression wraps to his stockings things did not go well. He has several small open areas on the right leg. He states this was caused by the compression wrap on his skin although he did not wear this with the stockings over them. He has several superficial areas on the left leg medially laterally posteriorly. He does not have any evidence of active cellulitis especially involving the left Achilles The patient is traveling from [facility] Saturday going to [facility]. He states he isn't attempting to get an appointment with a heel objects wound center there to change his dressings. I am not completely certain whether this will work 10/06/17; the patient came in on Friday for a nurse visit and the nurse reported that his legs  actually look quite good. He arrives in clinic today for his regular follow-up visit. He has a new wound on his left third toe over the PIP probably caused by friction with his footwear. He has small areas on the left leg and a very superficial but epithelialized area on the right anterior lateral lower leg. Other than that his legs look as good as I've seen him in quite some time. We have been using silver alginate Review of systems; no chest pain no shortness of breath other than this a 10 point review of systems negative 10/20/17; seen by Dr. Meyer Russel last week. He had taken some antibiotics [doxycycline] that he had left over. Dr. Meyer Russel thought he had candida infection and declined to give him further antibiotics. He has a small wound remaining on the right lateral leg several areas on the left leg including a larger area on the left posterior several left medial and anterior and a small wound on the left lateral. The area on the left dorsal third toe looks a lot better. ROS; Gen.; no fever, respiratory no cough no sputum Cardiac no chest pain other than this 10 point review of system is negative 10/30/17; patient arrives today having fallen in the bathtub 3 days ago. It took him a while to get up. He has pain and maceration in the wounds on his left leg which have deteriorated. He has not been using his pumps he also has some  maceration on the right lateral leg. 11/03/17; patient continues to have weeping edema especially in the left leg. This saturates his dressings which were just put on on 12/27. As usual the doxycycline seems to take care of the cellulitis on his lower leg. He is not complaining of fever, chills, or other systemic symptoms. He states his leg feels a lot better on the doxycycline I gave him empirically. He also apparently gets injections at his primary doctor's officeo Rocephin for cellulitis prophylaxis. I didn't ask him about his compression pump compliance today I think  that's probably marginal. Arrives in the clinic with all of his dressings primary and secondary macerated full of fluid and he has bilateral edema 11/10/17; the patient's right leg looks some better although there is still a cluster of wounds on the right lateral. The left leg is inflamed with almost circumferential skin loss medially to laterally although we are still maintaining anteriorly. He does not have overt cellulitis there is a lot of drainage. He is not using compression pumps. We have been using silver alginate to the wound areas, there are not a lot of options here 11/17/17; the patient's right leg continues to be stable although there is still open wounds, better than last week. The inflammation in the left leg is better. Still loss of surface layer epithelium especially posteriorly. There is no overt cellulitis in the amount of edema and his left leg is really quite good, tells me he is using his compression pumps once a day. 11/24/17; patient's right leg has a small superficial wound laterally this continues to improve. The inflammation in the left leg is still improving however we have continuous surface layer epithelial loss posteriorly. There is no overt cellulitis in the amount of edema in both legs is really quite good. He states he is using his compression pumps on the left leg once a day for 5 out of 7 days 12/01/17; very small superficial areas on the right lateral leg continue to improve. Edema control in both legs is better today. He has continued loss of surface epithelialization and left posterior calf although I think this is better. We have been using silver alginate with large number of absorptive secondary dressings 4 layer on the left Unna boot on the right at his request. He tells me he is using his compression pumps once a day 12/08/17; he has no open area on the right leg is edema control is good here. On the left leg however he has marked erythema and tenderness  breakdown of skin. He has what appears to be a wrap injury just distal to the popliteal fossa. 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 There was swelling erythema and breakdown in the right leg. He has much worse skin breakdown in the left leg as well multiple open areas medially and posteriorly as well as laterally. He tells me he has been using his compression pumps but tells me he feels that the drainage out of his leg is worse when he uses a compression pumps. To be fair to him he is been saying this for a while however I don't know that I have really been listening to this. I wonder if the compression pumps are  working properly 12/22/17;. Once again he arrives with severe erythema, weeping edema from the left greater than right leg. Noncompliance with compression pumps. New this visit he is complaining of pain on the lateral aspect of the right leg and the medial aspect of his right thigh. He apparently saw his cardiologist Dr. [name] who was ordered an echocardiogram area and I think this is a step in the right direction 12/25/17; started his doxycycline Monday night. There is still intense erythema of the right leg especially in the anterior thigh although there is less tenderness. The erythema around the wound on the right lateral calf also is less tender. He still complaining of pain in the left heel. His wounds are about the same right lateral left medial left lateral. Superficial but certainly not close to closure. He denies being systemically unwell no fever chills no abdominal pain no diarrhea 12/29/17; back in follow-up of his extensive right calf and right thigh cellulitis. I added amoxicillin to cover  possible doxycycline resistant strep. This seems to of done the trick he is in much less pain there is much less erythema and swelling. He has his echocardiogram at 11:00 this morning. X-ray of the left heel was also negative. 01/05/18; the patient arrived with his edema under much better control. Now that he is retired he is able to use his compression pumps daily and sometimes twice a day per the patient. He has a wound on the right leg the lateral wound looks better. Area on the left leg also looks a lot better. He has no evidence of cellulitis in his bilateral thighs I had a quick peak at his echocardiogram. He is in normal ejection fraction and normal left ventricular function. He has moderate pulmonary hypertension moderately reduced right ventricular function. One would have to wonder about chronic sleep apnea although he says he doesn't snore. He'll review the echocardiogram with his cardiologist. 01/12/18; the patient arrives with the edema in both legs under exemplary control. He is using his compression pumps daily and sometimes twice daily. His wound on the right lateral leg is just about closed. He still has some weeping areas on the posterior left calf and lateral left calf although everything is just about closed here as well. I have spoken with Aldean Baker who is the patient's nurse practitioner and infectious disease. She was concerned that the patient had not understood that the parenteral penicillin injections he was receiving for cellulitis prophylaxis was actually benefiting him. I don't think the patient actually saw that I would tend to agree we were certainly dealing with less infections although he had a serious one last month. 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 He is healed. He'll be placed in juxtalite compression wraps and increase his lymphedema pumps to twice daily. We will follow up again next week to ensure there are no  issues with the new regiment. 01/20/18-he is here for evaluation of bilateral lower extremity weeping edema. Yesterday he was placed in compression wrap to the right lower extremity and compression stocking to left lower shrubbery. He states he uses lymphedema pumps last night and again this morning and noted a blister to the left lower extremity. On exam he was noted to have drainage to the right lower extremity. He will be placed in Unna boots bilaterally and follow-up next week 01/26/18; patient was actually discharged a week ago to his own juxta light stockings only to return the next day with bilateral lower  extremity weeping edema.he was placed in bilateral Unna boots. He arrives today with pain in the back of his left leg. There is no open area on the right leg however there is a linear/wrap injury on the left leg and weeping edema on the left leg posteriorly. I spoke with infectious disease about 10 days ago. They were disappointed that the patient elected to discontinue prophylactic intramuscular penicillin shots as they felt it was particularly beneficial in reducing the frequency of his cellulitis. I discussed this with the patient today. He does not share this view. He'll definitely need antibiotics today. Finally he is traveling to [place] and trauma leaving this Saturday and returning a week later and he does not travel with his pumps. He is going by car 01/30/18; patient was seen 4 days ago and brought back in today for review of cellulitis in the left leg posteriorly. I put him on amoxicillin this really hasn't helped as much as I might like. He is also worried because he is traveling to [facility] trauma by car. Finally we will be rewrapping him. There is no open area on the right leg over his left leg has multiple weeping areas as usual 02/09/18; The same wrap on for 10 days. He did not pick up the last doxycycline I prescribed for him. He apparently took 4 days worth he already had.  There is nothing open on his right leg and the edema control is really quite good. He's had damage in the left leg medially and laterally especially probably related to the prolonged use of Unna boots 02/12/18; the patient arrived in clinic today for a nurse visit/wrap change. He complained of a lot of pain in the left posterior calf. He is taking doxycycline that I previously prescribed for him. Unfortunately even though he used his stockings and apparently used to compression pumps twice a day he has weeping edema coming out of the lateral part of his right leg. This is coming from the lower anterior lateral skin area. 02/16/18; the patient has finished his doxycycline and will finish the amoxicillin 2 days. The area of cellulitis in the left calf posteriorly has resolved. He is no longer having any pain. He tells me he is using his compression pumps at least once a day sometimes twice. 02/23/18; the patient finished his doxycycline and Amoxil last week. On Friday he noticed a small erythematous circle about the size of a quarter on the left lower leg just above his ankle. This rapidly expanded and he now has erythema on the lateral and posterior part of the thigh. This is bright red. Also has an area on the dorsal foot just above his toes and a tender area just below the left popliteal fossa. He came off his prophylactic penicillin injections at his own insistence one or 2 months ago. This is obviously deteriorated since then 03/02/18; patient is on doxycycline and Amoxil. Culture I did last week of the weeping area on the back of his left calf grew group B strep. I have therefore renewed the amoxicillin 500 3 times a day for a further week. He has not been systemically unwell. Still complaining of an area of discomfort right under his left popliteal fossa. There is no open wound on the right leg. He tells me that he is using his pumps twice a day on most days 03/09/18; patient arrives in clinic  today completing his amoxicillin today. The cellulitis on his left leg is better. Furthermore he tells me that  he had intramuscular penicillin shots that his primary care office today. However he also states that the wrap on his right leg fell down shortly after leaving clinic last week. He developed a large blister that was present when he came in for a nurse visit later in the week and then he developed intense discomfort around this area.He tells me he is using his compression pumps 03/16/18; the patient has completed his doxycycline. The infectious part of this/cellulitis in the left heel area left popliteal area is a lot better. He has 2 open areas on the right calf. Still areas on the left calf but this is a lot better as well. 03/24/18; the patient arrives complaining of pain in the left popliteal area again. He thinks some of this is wrap injury. He has no open area on the right leg and really no open area on the left calf either except for the popliteal area. He claims to be compliant with the compression pumps 03/31/18; I gave him doxycycline last week because of cellulitis in the left popliteal area. This is a lot better although the surface epithelium is denuded off and response to this. He arrives today with uncontrolled edema in the right calf area as well as a fingernail injury in the right lateral calf. There is only a few open areas on the left 04/06/18; I gave him amoxicillin doxycycline over the last 2 weeks that the amoxicillin should be completing currently. He is not complaining of any pain or systemic symptoms. The only open areas see has is on the right lateral lower leg paradoxically I cannot see anything on the left lower leg. He tells me he is using his compression pumps twice a day on most days. Silver alginate to the wounds that are open under 4 layer compression 04/13/18; he completed antibiotics and has no new complaints. Using his compression pumps. Silver alginate  that anything that's opened 04/20/18; he is using his compression pumps religiously. Silver alginate 4 layer compression anything that's opened. He comes in today with no open wounds on the left leg but 3 on the right including a new one posteriorly. He has 2 on the right lateral and one on the right posterior. He likes Unna boots on the right leg for reasons that aren't really clear we had the usual 4 layer compression on the left. It may be necessary to move to the 4 layer compression on the right however for now I left them in the Unna boots 04/27/18; he is using his compression pumps at least once a day. He has still the wounds on the right lateral calf. The area right posteriorly has closed. He does not have an open wound on the left under 4 layer compression however on the dorsal left foot just proximal to the toes and the left third toe 2 small open areas were identified 05/11/18; he has not uses compression pumps. The areas on the right lateral calf have coalesced into one large wound necrotic surface. On the left side he has one small wound anteriorly however the edema is now weeping out of a large part of his left leg. He says he wasn't using his pumps because of the weeping fluid. I explained to him that this is the time he needs to pump more 05/18/18; patient states he is using his compression pumps twice a day. The area on the right lateral large wound albeit superficial. On the left side he has innumerable number of small new wounds on  the left calf particularly laterally but several anteriorly and medially. All these appear to have healthy granulated base these look like the remnants of blisters however they occurred under compression. The patient arrives in clinic today with his legs somewhat better. There is certainly less edema, less multiple open areas on the left calf and the right anterior leg looks somewhat better as well superficial and a little smaller. However he relates pain  and erythema over the last 3-4 days in the thigh and I looked at this today. He has not been systemically unwell no fever no chills no change in blood sugar values 05/25/18; comes in today in a better state. The severe cellulitis on his left leg seems better with the Keflex. Not as tender. He has not been systemically unwell Hard to find an open wound on the left lower leg using his compression pumps twice a day The confluent wounds on his right lateral calf somewhat better looking. These will ultimately need debridement I didn't do this today. 06/01/18; the severe cellulitis on the left anterior thigh has resolved and he is completed his Keflex. There is no open wound on the left leg however there is a superficial excoriation at the base of the third toe dorsally. Skin on the bottom of his left foot is macerated looking. The left the wounds on the lateral right leg actually looks some better although he did require debridement of the top half of this wound area with an open curet 06/09/18 on evaluation today patient appears to be doing poorly in regard to his right lower extremity in particular this appears to likely be infected he has very thick purulent discharge along with a bright green tent to the discharge. This makes me concerned about the possibility of pseudomonas. He's also having increased discomfort at this point on evaluation. Fortunately there does not appear to be any evidence of infection spreading to the other location at this time. 06/16/18 on evaluation today patient appears to actually be doing fairly well. His ulcer has actually diminished in size quite significantly at this point which is good news. Nonetheless he still does have some evidence of infection he did see infectious disease this morning before coming here for his appointment. I did review the results of their evaluation and their note today. They did actually have him discontinue the Cipro and initiate treatment with  linezolid at this time. He is doing this for the next seven days and they recommended a follow-up in four months with them. He is the keep a log of the need for intermittent antibiotic therapy between now and when he falls back up with infectious disease. This will help them gaze what exactly they need to do to try and help them out. 06/23/18; the patient arrives today with no open wounds on the left leg and left third toe healed. He is been using his compression pumps twice a day. On the right lateral leg he still has a sizable wound but this is a lot better than last time I saw this. 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 Has been using silver alginate under 4 layer compression 06/30/18; the only open wound he has is on the right lateral leg and this looks healthy. No debridement is required. We have been using silver alginate. He does not have an open wound on the left leg. There is apparently some drainage  from the dorsal proximal third toe on the left although I see no open wound here. 07/03/18 on evaluation today patient was actually here just for a nurse visit rapid change. However when he was here on Wednesday for his rat change due to having been healed on the left and then developing blisters we initiated the wrap again knowing that he would be back today for [place] to reevaluate and see were at. Unfortunately he has developed some cellulitis into the proximal portion of his right lower extremity even into the region of his thigh. 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 He was placed on one as what at that point. Nonetheless he is done with that and has been tolerating it well otherwise. Doxycycline which in the past really did not seem to be effective for him. Nonetheless I think the best option may be for [place] to definitely reinitiate the  antibiotics for a longer period of time. 07/07/18; since I last saw this patient a week ago he has had a difficult time. At that point he did not have an open wound on his left leg. We transitioned him into juxta light stockings. He was apparently in the clinic the next day with blisters on the left lateral and left medial lower calf. He also had weeping edema fluid. He was put back into a compression wrap. He was also in the clinic on Friday with intense erythema in his right thigh. Per the patient he was started on Bactrim however that didn't work at all in terms of relieving his pain and swelling. He has taken 3 doxycycline that he had left over from last time and that seems to of helped. He has blistering on the right thigh as well. 07/14/18; the erythema on his right thigh has gotten better with doxycycline that he is finishing. 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 Presumably this cellulitis in the thigh was not related to that although I think this is a bit concerning going forward. He still has an area on the right lateral calf the blister on the right medial calf just below the knee that was discussed above. On the left 2 small open areas left medial and left lateral. Edema control is adequate. He is using his compression pumps twice a day 07/20/18; continued improvement in the condition of both legs especially the edema in his bilateral thighs. He tells me he is been losing weight through a combination of diet and exercise. He is using his compression pumps twice a day. So overall she made to the remaining wounds 07/27/2018; continued improvement in condition of both legs. His edema is well controlled. The area on the right lateral leg is just about closed he had one blisters show up on the medial left upper calf. We have him in 4 layer compression. He is going on a 10-day trip to [place], [place] and  [place]. He will be driving. He wants to wear Unna boots because of the lessening amount of constriction. He will not use compression pumps while he is away 08/05/18 on evaluation today patient actually appears to be doing decently well all things considered in regard to his bilateral lower extremities. The worst ulcer is actually only posterior aspect of his left lower extremity with a four layer compression wrap cut into his leg a couple weeks back. He did have  a trip and actually had [employer] wraps for the trip that he is worn since he was last here. Nonetheless he feels like the [employer] wraps actually do better for him his swelling is up a little bit but he also with his trip was not taking his Lasix on a regular set schedule like he was supposed to be. He states that obviously the reason being that he cannot drive and keep going without having to urinate too frequently which makes it difficult. He did not have his pumps with him while he was away either which I think also maybe playing a role here too. 08/13/2018; the patient only has a small open wound on the right lateral calf which is a big improvement in the last month or 2. He also has the area posteriorly just below the posterior fossa on the left which I think was a wrap injury from several weeks ago. He has no current evidence of cellulitis. He tells me he is back into his compression pumps twice a day. He also tells me that while he was at the laundromat somebody stole a section of his extremitease stockings 08/20/2018; back in the clinic with a much improved state. He only has small areas on the right lateral mid calf which is just about healed. This was is more substantial area for quite a prolonged period of time. He has a small open area on the left anterior tibia. The area on the posterior calf just below the popliteal fossa is closed today. He is using his compression pumps twice a day 08/28/2018; patient has no open wound  on the right leg. He has a smattering of open areas on the calf with some weeping lymphedema. More problematically than that it looks as though his wraps of slipped down in his usual he has very angry upper area of edema just below the right medial knee and on the right lateral calf. He has no open area on his feet. [place] I will send him in an antibiotic. We will continue to wrap the right leg. We ordered extremitease stockings for him last week and I plan to transition the right leg to a stocking when he gets home which will be in 10 days time. As usual he is very reluctant to take his pumps with him when he travels 09/07/2018; [place] He shows me a picture of his left leg in the mid part of his trip last week with intense fire engine erythema. The picture look bad enough I would have considered sending him to the hospital. Instead he went [place] They did not prescribe him antibiotics but he did take some doxycycline he had leftover from a previous visit. I had given him trimethoprim sulfamethoxazole before he left this did not work according to the patient. This is resulted in some improvement fortunately. He comes back with a large wound on the left posterior calf. Smaller area on the left anterior tibia. Denuded blisters on the dorsal left foot over his toes. Does not have much in the way of wounds on the right leg although he does have a very tender area on the right posterior area just below the popliteal fossa also suggestive of infection. He promises me he is back on his pumps twice a day 09/15/2018; the intense cellulitis in his left lower calf is a lot better. The wound area on  the posterior left calf is also so better. However he has reasonably extensive wounds on the dorsal aspect of his second and third toes and the proximal foot just at the base of the toes. There is  nothing open on the right leg 09/22/2018; the patient has excellent edema control in his legs bilaterally. He is using his external compression pumps twice a day. He has no open area on the right leg and only the areas in the left foot dorsally second and third toe area on the left side. He does not have any signs of active cellulitis. 10/06/2018; the patient has good edema control bilaterally. He has no open wound on the right leg. There is a blister in the posterior aspect of his left calf that we had to deal with today. He is using his compression pumps twice a day. There is no signs of active cellulitis. We have been using silver alginate to the wound areas. He still has vulnerable areas on the base of his left first second toes dorsally He has a his extremities stockings and we are going to transition him today into the stocking on the right leg. He is cautioned that he will need to continue to use the compression pumps twice a day. If he notices uncontrolled edema in the right leg he may need to go to 3 times a day. 10/13/2018; the patient came in for a nurse check on Friday he has a large flaccid blister on the right medial calf just below the knee. We unroofed this. He has this and a new area underneath the posterior mid calf which was undoubtedly a blister as well. He also has several small areas on the right which is the area we put his extremities stocking on. 10/19/2018; the patient went to see infectious disease this morning I am not sure if that was a routine follow-up in any case the doxycycline I had given him was discontinued and started on linezolid. He has not started this. It is easy to look at his left calf and the inflammation and think this is cellulitis however he is very tender in the tissue just below the popliteal fossa and I have no doubt that there is infection going on here. He states the problem he is having is that with the compression pumps the edema goes down and  then starts walking the wrap falls down. We will see if we can adhere this. He has 1 or 2 minuscule open areas on the right still areas that are weeping on the posterior left calf, the base of his left second and third toes 10/26/18; back today in clinic with quite of skin breakdown in his left anterior leg. This may have been infection the area below the popliteal fossa seems a lot better however tremendous epithelial loss on the left anterior mid tibia area over quite inexpensive tissue. He has 2 blisters on the right side but no other open wound here. 10/29/2018; came in urgently to see [place] today and we worked him in for review. He states that the 4 layer compression on the right leg caused pain he had to cut it down to roughly his mid calf this caused swelling above the wrap and he has blisters and skin breakdown today. As a result of the pain he has not been using his pumps. Both legs are a lot more edematous and there is a lot of weeping fluid. 11/02/18; arrives in clinic with continued difficulties in the right leg> left.  Leg is swollen and painful. multiple skin blisters and new open areas especially laterally. He has not been using his pumps on the right leg. 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 He is not systemically unwell. 11/09/2018; the patient claims he is being compliant with his pumps. He is finished the doxycycline I gave him last week. Culture I did of the wound on the right lateral leg showed a few very resistant methicillin staph aureus. This was resistant to doxycycline. Nevertheless he states the pain in the leg is a lot better which makes me wonder if the cultured organism was not really what was causing the problem nevertheless this is a very dangerous organism to be culturing out of any wound. His right leg is still a lot larger than the left. He is using an Radio broadcast assistant on this area, he blames a 4-layer compression for  causing the original skin breakdown which I doubt is true however I cannot talk him out of it. We have been using silver alginate to all of these areas which were initially blisters 11/16/2018; patient is being compliant with his external compression pumps at twice a day. Miraculously he arrives in clinic today with absolutely no open wounds. He has better edema control on the left where he has been using 4 layer compression versus wound of wounds on the right and I pointed this out to him. There is no inflammation in the skin in his lower legs which is also somewhat unusual for him. There is no open wounds on the dorsal left foot. He has extremitease stockings at home and I have asked him to bring these in next week. 11/25/18 patient's lower extremity on examination today on the left appears for the most part to be wound free. He does have an open wound on the lateral aspect of the right lower extremity but this is minimal compared to what I've seen in past. He does request that we go ahead and wrap the left leg as well even though there's nothing open just so hopefully it will not reopen in short order. 1/28; patient has superficial open wounds on the right lateral calf left anterior calf and left posterior calf. His edema control is adequate. 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 We have been using silver alginate as the primary dressing. He claims compliance with his compression pumps 2/4; patient has superficial open wounds on numerous areas of his left calf and again one on the left dorsal foot. The areas on the right lateral calf have healed. The cellulitis that I gave him doxycycline for last week is also resolved this was mostly on the left anterior calf just below the tibial tuberosity. His edema looks fairly well-controlled. He tells me he went to see his primary doctor today and had blood work  ordered 2/11; once again he has several open areas on the left calf left tibial area. Most of these are small and appear to have healthy granulation. He does not have anything open on the right. The edema and control in his thighs is pretty good which is usually a good indication he has been using his pumps as requested. 2/18; he continues to have several small areas on the left calf and left tibial area. Most of these are small healthy granulation. We put him in his stocking on the right leg last week and  he arrives with a superficial open area over the right upper tibia and a fairly large area on the right lateral tibia in similar condition. His edema control actually does not look too bad, he claims to be using his compression pumps twice a day 2/25. Continued small areas on the left calf and left tibial area. New areas especially on the right are identified just below the tibial tuberosity and on the right upper tibia itself. There are also areas of weeping edema fluid even without an obvious wound. He does not have a considerable degree of lymphedema but clearly there is more edema here than his skin can handle. He states he is using the pumps twice a day. We have an Unna boot on the right and 4 layer compression on the left. 3/3; he continues to have an area on the right lateral calf and right posterior calf just below the popliteal fossa. There is a fair amount of tenderness around the wound on the popliteal fossa but I did not see any evidence of cellulitis, could just be that the wrap came down and rubbed in this area. He does not have an open area on the left leg however there is an area on the left dorsal foot at the base of the third toe We have been using silver alginate to all wound areas 3/10; he did not have an open area on his left leg last time he was here a week ago. Today he arrives with a horizontal wound just below the tibial tuberosity and an area on the left lateral calf.  He has intense erythema and tenderness in this area. The area is on the right lateral calf and right posterior calf better than last week. We have been using silver alginate as usual 3/18 - Patient returns with 3 small open areas on left calf, and 1 small open area on right calf, the skin looks ok with no significant erythema, he continues the UNA boot on right and 4 layer compression on left. The right lateral calf wound is closed , the right posterior is small area. we will continue silver alginate to the areas. 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 He is been tolerating the dressing changes without complication. Fortunately she has made excellent improvement in regard to the overall status of his wounds. Unfortunately every time we cease wrapping him he ends up reopening in causing more significant issues at that point. Again I'm unsure of the best direction to take although I think the lymphedema clinic may be appropriate for him. 02/03/19 on evaluation today patient appears to be doing well in regard to the wounds that we saw him for last week unfortunately he has a new area on the proximal portion of his right medial/posterior lower extremity where the wrap somewhat slowed down and caused swelling and a blister to rub and open. Unfortunately this is the only opening that he has on either leg at this point. 02/17/19 on evaluation today patient's bilateral lower extremities appear to be doing well. He still completely healed in regard to the left lower extremity. In regard to the right lower extremity the area where the wrap and slid down and caused the blister still seems to be slightly open although this is dramatically better than during the last evaluation two weeks  ago. I'm very pleased with  the way this stands overall. 03/03/19 on evaluation today patient appears to be doing well in regard to his right lower extremity in general although he did have a new blister open this does not appear to be showing any evidence of active infection at this time. Fortunately there's No fevers, chills, nausea, or vomiting noted at this time. Overall I feel like he is making good progress it does feel like that the right leg will we perform the [employer] seems to do with a bit better than three layer wrap on the left which slid down on him. We may switch to doing bilateral in the book wraps. 5/4; I have not seen Mr. Johnson in quite some time. According to our case manager he did not have an open wound on his left leg last week. He had 1 remaining wound on the right posterior medial calf. He arrives today with multiple openings on the left leg probably were blisters and/or wrap injuries from Unna boots. I do not think the Unna boot's will provide adequate compression on the left. I am also not clear about the frequency he is using the compression pumps. 03/17/19 on evaluation today patient appears to be doing excellent in regard to his lower extremities compared to last week's evaluation apparently. He had gotten significantly worse last week which is unfortunate. The [employer] wrap on the left did not seem to do very well for him at all and in fact it didn't control his swelling significantly enough he had an additional outbreak. Subsequently we go back to the four layer compression wrap on the left. This is good news. At least in that he is doing better and the wound seem to be killing him. He still has not heard anything from the lymphedema clinic. 03/24/19 on evaluation today patient actually appears to be doing much better in regard to his bilateral lower Trinity as compared to last week when I saw him. Fortunately there's no signs of active infection at this time. He has been tolerating the  dressing changes without complication. Overall I'm extremely pleased with the progress and appearance in general. 04/07/19 on evaluation today patient appears to be doing well in regard to his bilateral lower extremities. His swelling is significantly down from where it was previous. With that being said he does have a couple blisters still open at this point but fortunately nothing that seems to be too severe and again the majority of the larger openings has healed at this time. 04/14/19 on evaluation today patient actually appears to be doing quite well in regard to his bilateral lower extremities in fact I'm not even sure there's anything significantly open at this time at any site. Nonetheless he did have some trouble with these wraps where they are somewhat irritating him secondary to the fact that he has noted that the graph wasn't too close down to the end of this foot in a little bit short as well up to his knee. Otherwise things seem to be doing quite well. 04/21/19 upon evaluation today patient's wound bed actually showed evidence of being completely healed in regard to both lower extremities which is excellent news. There does not appear to be any signs of active infection which is also good news. I'm very pleased in this regard. No fevers, chills, nausea, or vomiting noted at this time. 04/28/19 on evaluation today patient appears to be doing a little bit worse in regard to both lower extremities  on the left mainly due to the fact that when he went infection disease the wrap was not wrapped quite high enough he developed a blister above this. On the right he is a small open area of nothing too significant but again this is continuing to give him some trouble he has been were in the Velcro compression that he has at home. 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 He's been tolerating the dressing changes without complication.  Fortunately there's no signs of active infection at this time. No fevers, chills, nausea, or vomiting noted at this time. We have been trying to get an appointment with her lymphedema clinic in [facility] but unfortunately nobody can get them on phone with not been able to even fax information over the patient likewise is not been able to get in touch with them. Overall I'm not sure exactly what's going on here with to reach out again today. 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 Still having a lot of drainage unfortunately. He tells me especially in the left but even on the right. There's no signs of active infection which is good news we've been using so ratcheted up to this point. 05/19/19 on evaluation today patient actually appears to be doing quite well with regard to his left lower extremity which is great news. Fortunately in regard to the right lower extremity has an issues with his wrap and he subsequently did remove this from what I'm understanding. Nonetheless long story short is what he had rewrapped once he removed it subsequently had maggots underneath this wrap whenever he came in for evaluation today. With that being said they were obviously completely cleaned away by the nursing staff. The visit today which is excellent news. However he does appear to potentially have some infection around the right ankle region where the maggots were located as well. He will likely require anabiotic therapy today. 05/26/19 on evaluation today patient actually appears to be doing much better in regard to his bilateral lower extremities. I feel like the infection is under much better control. With that being said there were maggots noted when the wrap was removed yet again today. Again this could have potentially been left over from previous although at this time there does not appear to be any  signs of significant drainage there was obviously on the wrap some drainage as well this contracted gnats or otherwise. Either way I do not see anything that appears to be doing worse in my pinion and in fact I think his drainage has slowed down quite significantly likely mainly due to the fact to his infection being under better control. 06/02/2019 on evaluation today patient actually appears to be doing well with regard to his bilateral lower extremities there is no signs of active infection at this time which is great news. With that being said he does have several open areas more so on the right than the left but nonetheless these are all significantly better than previously noted. 06/09/2019 on evaluation today patient actually appears to be doing well. His wrap stayed up and he did not cause any problems he had more drainage on the right compared to the left but overall I do not see any major issues at this time which is great news. 06/16/2019 on evaluation today patient appears to be doing excellent with regard  to his lower extremities the only area that is open is a new blister that can have opened as of today on the medial ankle on the left. Other than this he really seems to be doing great I see no major issues at this point. 06/23/2019 on evaluation today patient appears to be doing quite well with regard to his bilateral lower extremities. In fact he actually appears to be almost completely healed there is a small area of weeping noted of the right lower extremity just above the ankle. Nonetheless fortunately there is no signs of active infection at this time which is good news. No fevers, chills, nausea, vomiting, or diarrhea. 8/24; the patient arrived for a nurse visit today but complained of very significant pain in the left leg and therefore I was asked to look at this. Noted that he did not have an open area on the left leg last week nevertheless this was wrapped. The patient states  that he is not been able to put his compression pumps on the left leg because of the discomfort. He has not been systemically unwell 06/30/2019 on evaluation today patient unfortunately despite being excellent last week is doing much worse with regard to his left lower extremity today. In fact he had to come in for a nurse on Monday where his left leg had to be rewrapped due to excessive weeping Dr. Leanord Hawking placed him on doxycycline at that point. Fortunately there is no signs of active infection Systemically at this time which is good news. 07/07/2019 in regard to the patient's wounds today he actually seems to be doing well with his right lower extremity there really is nothing open or draining at this point this is great news. Unfortunately the left lower extremity is given him additional trouble at this time. There does not appear to be any signs of active infection nonetheless he does have a lot of edema and swelling noted at this point as well as blistering all of which has led to a much more poor appearing leg at this time compared to where it was 2 weeks ago when it was almost completely healed. Obviously this is a little discouraging for the patient. He is try to contact the lymphedema clinic in [place] he has not been able to get through to them. 07/14/2019 on evaluation today patient actually appears to be doing slightly better with regard to his left lower extremity ulcers. Overall I do feel like at least at the top of the wrap that we have been placing this area has healed quite nicely and looks much better. The remainder of the leg is showing signs of improvement. Unfortunately in the thigh area he still has an open region on the left and again on the right he has been utilizing just a Band-Aid on an area that also opened on the thigh. Again this is an area that were not able to wrap although we did do an Ace wrap to provide some compression that something that obviously is a little  less effective than the compression wraps we have been using on the lower portion of the leg. He does have an appointment with the lymphedema clinic in [facility] on Friday. 07/21/2019 on evaluation today patient appears to be doing better with regard to his lower extremity ulcers. He has been tolerating the dressing changes without complication. Fortunately there is no signs of active infection at this time. No fevers, chills, nausea, vomiting, or diarrhea. I did receive the paperwork from the  physical therapist [place]. Subsequently I signed off on that this morning and sent that back to him for further progression with the treatment plan. 07/28/2019 on evaluation today patient appears to be doing very well with regard to his right lower extremity where I do not see any open wounds at this point. Fortunately he is feeling great as far as that is concerned as well. In regard to the left lower extremity he has been having issues with still several areas of weeping and edema although the upper leg is doing better his lower leg still I think is going require the compression wrap at this time. No fevers, chills, nausea, vomiting, or diarrhea. 08/04/2019 on evaluation today patient unfortunately is having new wounds on the right lower extremity. Again we have been using Unna boot wrap on that side. We switched him to using his juxta lite wrap at home. With that being said he tells me he has been using it although his legs extremely swollen and to be honest really does not appear that he has been. I cannot know that for sure however. Nonetheless he has multiple new wounds on the right lower extremity at this time. Obviously we will have to see about getting this rewrapped for him today. 08/11/2019 on evaluation today patient appears to be doing fairly well with regard to his wounds. He has been tolerating the dressing changes including the compression wraps without  complication. He still has a lot of edema in his upper thigh regions bilaterally he is supposed to be seeing the lymphedema clinic on the 15th of this month once his wraps arrive for the upper part of his legs. 08/18/2019 on evaluation today patient appears to be doing well with regard to his bilateral lower extremities at this point. He has been tolerating the dressing changes without complication. Fortunately there is no signs of active infection which is also good news. He does have a couple weeping areas on the first and second toe of the right foot he also has just a small area on the left foot upper leg and a small area on the left lower leg but overall he is doing quite well in my opinion. He is supposed to be getting his wraps shortly in fact tomorrow and then subsequently is seeing the lymphedema clinic next Wednesday on the 21st. Of note he is also leaving on the 25th to go on vacation for a week to the beach. For that reason and since there is some uncertainty about what there can be doing at lymphedema clinic next Wednesday I am get a make an appointment for next Friday here for us to see what we need to do for him prior to him leaving for vacation. 10/23; patient arrives in considerable pain predominantly in the upper posterior calf just distal to the popliteal fossa also in the wound anteriorly above the major wound. This is probably cellulitis and he has had this recurrently in the past. He has no open wound on the right side and he has had an Radio broadcast assistantUnna boot in that area. Finally I note that he has an area on the left posterior calf which by enlarge is mostly epithelialized. This protrudes beyond the borders of the surrounding skin in the setting of dry scaly skin and lymphedema. [place] Per his longstanding pattern, he will not take his compression pumps with him predominantly out of fear that they will be stolen. He  therefore asked that we put  a Unna boot back on the right leg. He will also contact the wound care center in [facility] to see if they can change his dressing in the mid week. 11/3; patient returned from his vacation to [facility]. He was seen on 1 occasion at their wound care center. They did a 2 layer compression system as they did not have our 4-layer wrap. I am not completely certain what they put on the wounds. They did not change the Unna boot on the right. The patient is also seeing a lymphedema specialist physical therapist in [place]. It appears that he has some compression sleeve for his thighs which indeed look quite a bit better than I am used to seeing. He pumps over these with his external compression pumps. 11/10; the patient has a new wound on the right medial thigh otherwise there is no open areas on the right. He has an area on the left leg posteriorly anteriorly and medially and an area over the left second toe. We have been using silver alginate. He thinks the injury on his thigh is secondary to friction from the compression sleeve he has. 11/17; the patient has a new wound on the right medial thigh last week. He thinks this is because he did not have a underlying stocking for his thigh juxta lite apparatus. He now has this. The area is fairly large and somewhat angry but I do not think he has underlying cellulitis. He has a intact blister on the right anterior tibial area. Small wound on the right great toe dorsally Small area on the medial left calf. 11/30; the patient does not have any open areas on his right leg and we did not take his juxta lite stocking off. However he states that on Friday his compression wrap fell down lodging around his upper mid calf area. As usual this creates a lot of problems for him. He called urgently today to be seen for a nurse visit however the nurse visit turned into a provider visit because of extreme erythema and pain in the left anterior tibia extending  laterally and posteriorly. The area that is problematic is extensive 10/06/2019 upon evaluation today patient actually appears to be doing poorly in regard to his left lower extremity. He Dr. Leanord Hawking did place him on doxycycline this past Monday apparently due to the fact that he was doing much worse in regard to this left leg. Fortunately the doxycycline does seem to be helping. Unfortunately we are still having a very difficult time getting his edema under any type of control in order to anticipate discharge at some point. The only way were really able to control his lymphedema really is with compression wraps and that has only even seemingly temporary. He has been seeing a lymphedema clinic they are trying to help in this regard but still this has been somewhat frustrating in general for the patient. 10/13/19 on evaluation today patient appears to be doing excellent with regard to his right lower extremity as far as the wounds are concerned. His swelling is still quite extensive unfortunately. He is still having a lot of drainage from the thigh areas bilaterally which is unfortunate. He's been going to lymphedema clinic but again he still really does not have this edema under control as far as his lower extremities are concern. With regard to his left lower extremity this seems to be improving and I do believe the doxycycline has been of benefit for him.  He is about to complete the doxycycline. 10/20/2019 on evaluation today patient appears to be doing poorly in regard to his bilateral lower extremities. More in the right thigh he has a lot of irritation at this site unfortunately. In regard to the left lower extremity the wrap was not quite as high it appears and does seem to have caused him some trouble as well. Fortunately there is no evidence of systemic infection though he does have some blue-green drainage which has me concerned for the possibility of Pseudomonas. He tells me he is  previously taking Cipro without complications and he really does not care for Levaquin however due to some of the side effects he has. He is not allergic to any medications specifically antibiotics that were aware of. 10/27/2019 on evaluation today patient actually does appear to be for the most part doing better when compared to last week's evaluation. With that being said he still has multiple open wounds over the bilateral lower extremities. He actually forgot to start taking the Cipro and states that he still has the whole bottle. He does have several new blisters on left lower extremity today I think I would recommend he go ahead and take the Cipro based on what I am seeing at this point. 12/30-Patient comes at 1 week visit, 4 layer compression wraps on the left and Unna boot on the right, primary dressing Xtrasorb and silver alginate. Patient is taking his Cipro and has a few more days left probably 5-6, and the legs are doing better. He states he is using his compressions devices which I believe he has 11/10/2019 on evaluation today patient actually appears to be much better than last time I saw him 2 weeks ago. His wounds are significantly improved and overall I am very pleased in this regard. Fortunately there is no signs of active infection at this time. He is just a couple of days away from completing Cipro. Overall his edema is much better he has been using his lymphedema pumps which I think is also helping at this point. 11/17/2019 on evaluation today patient appears to be doing excellent in regard to his wounds in general. His legs are swollen but not nearly as much as they have been in the past. Fortunately he is tolerating the compression wraps without complication. No fevers, chills, nausea, vomiting, or diarrhea. He does have some erythema however in the distal portion of his right lower extremity specifically around the forefoot and toes there is a little bit of warmth here as  well. 11/24/2019 on evaluation today patient appears to be doing well with regard to his right lower extremity I really do not see any open wounds at this point. His left lower extremity does have several open areas and his right medial thigh also is open. Other than this however overall the patient seems to be making good progress and I am very pleased at this point. 12/01/2019 on evaluation today patient appears to be doing poorly at this point in regard to his left lower extremity has several new blisters despite the fact that we have him in compression wraps. In fact he had a 4-layer compression wrap, his upper thigh wrapped from lymphedema clinic, and a juxta light over top of the 4 layer compression wrap the lymphedema clinic applied and despite all this he still develop blisters underneath. Obviously this does have me concerned about the fact that unfortunately despite what we are doing to try to get wounds healed he continues to  have new areas arise I do not think he is ever good to be at the point where he can realistically just use wraps at home to keep things under control. Typically when we heal him it takes about 1-2 days before he is back in the clinic with severe breakdown and blistering of his lower extremities bilaterally. This is happened numerous times in the past. Unfortunately I think that we may need some help as far as overall fluid overload to kind of limit what we are seeing and get things under better control. 12/08/2019 on evaluation today patient presents for follow-up concerning his ongoing bilateral lower extremity edema. Unfortunately he is still having quite a bit of swelling the compression wraps are controlling this to some degree but he did see Dr. [name] his cardiologist I do have that available for review today as far as the appointment was concerned that was on 12/06/2019. Obviously that she has been 2 days ago. The patient states that he is only been taking the  Lasix 80 mg 1 time a day he had told me previously he was taking this twice a day. Nonetheless Dr. [name] recommended this be up to 80 mg 2 times a day for the patient as he did appear to be fluid overloaded. With that being said the patient states he did this yesterday and he was unable to go anywhere or do anything due to the fact that he was constantly having to urinate. Nonetheless I think that this is still good to be something that is important for him as far as trying to get his edema under control at all things that he is going to be able to just expect his wounds to get under control and things to be better without going through at least a period of time where he is trying to stabilize his fluid management in general and I think increasing the Lasix is likely the first step here. It was also mentioned the possibility that the patient may require metolazone. With that being said he wanted to have the patient take Lasix twice a day first and then reevaluating 2 months to see where things stand. Electronic Signature(s) Signed: 12/08/2019 11:20:56 AM By: Lenda Kelp PA-C Entered By: Lenda Kelp on 12/08/2019 11:20:55 -------------------------------------------------------------------------------- Physical Exam Details Patient Name: Date of Service: [name], [name] 12/08/2019 10:15 AM Medical Record [id number] Patient Account Number: [id number] Date of Birth/Sex: Treating RN: [birth year] (69 y.o. M) Primary Care Provider: Nicoletta Ba Other Clinician: Referring Provider: Treating Provider/Extender:[name], [name], [name] in Treatment: 202 Constitutional Well-nourished and well-hydrated in no acute distress. Respiratory normal breathing without difficulty. Psychiatric this patient is able to make decisions and demonstrates good insight into disease process. Alert and Oriented x 3. pleasant and cooperative. Notes Patient's wounds currently appear to be doing  better in regard to the lower extremities he has a small area on the left dorsal foot and his right inner thigh is open and may be a little bit more coverage as far as surface area is concerned but overall does not appear to be doing as poorly. I think these are just very small areas that would likely fill up quickly with appropriate compression and fluid management. The patient does seem somewhat depressed by the fact that he was wanting to get into going back to walk at the [facility] and had just found out with somewhere he can go that was reopened and had a walking track. With that being said he  tells me he is not can be able to do this if indeed he is having to take this medication is good to be stuck at home. Electronic Signature(s) Signed: 12/08/2019 11:21:48 AM By: Lenda KelpStone III, Gerlean Cid PA-C Entered By: Lenda KelpStone III, Brantleigh Mifflin on 12/08/2019 11:21:48 -------------------------------------------------------------------------------- Physician Orders Details Patient Name: Date of Service: Alphonse GuildCOWPER, Primitivo J. 12/08/2019 10:15 AM Medical Record [id number] Patient Account Number: [id number] Date of Birth/Sex: Treating RN: 10/03/1951 (69 y.o. 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 Dressing Change Frequency Do not change entire dressing for one week. - both lower legs Skin Barriers/Peri-Wound Care Moisturizing lotion TCA Cream or Ointment - to legs mixed with lotion Wound Cleansing May shower with protection. Primary Wound Dressing Wound #145R Right,Medial Upper Leg Calcium Alginate with Silver Wound #156 Left,Dorsal Foot Calcium Alginate with Silver Secondary Dressing Wound #145R Right,Medial Upper Leg ABD pad - secured with tape Wound #156 Left,Dorsal Foot Dry Gauze Edema Control 4 layer compression: Left lower extremity - unna boot at upper portion of lower leg. pad bend of foot with foam Unna Boot to Right Lower Extremity Avoid standing for long periods of time Elevate legs to the level of the heart or above for 30 minutes daily and/or when sitting, a frequency of: - throughout the day Exercise regularly Segmental Compressive Device. - lymphadema pumps 60 min 2 times per day Other: - lymphadema wraps to upper leg per [facility] clinic Electronic Signature(s) Signed: 12/08/2019 6:36:45 PM By: Zenaida DeedBoehlein, Linda RN, BSN Signed: 12/08/2019 7:52:58 PM By: Lenda KelpStone III, Mikela Senn PA-C Entered By: Zenaida DeedBoehlein, Linda on 12/08/2019 11:14:58 -------------------------------------------------------------------------------- Problem List Details Patient Name: Date of Service: Alphonse GuildCOWPER, Makiah J. 12/08/2019 10:15 AM Medical Record [id number] Patient Account Number: [id number] Date of Birth/Sex: Treating RN: 12/29/1950 (69 y.o. Damaris SchoonerM) Boehlein, Linda Primary Care Provider: Nicoletta BaMCGOWEN, [name] Other Clinician: Referring Provider: Treating Provider/Extender:[name], Briant CedarHoyt MCGOWEN, [name] in Treatment: 202 Active Problems ICD-10 Evaluated Encounter Code Description Active Date Today Diagnosis L97.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 right calf limited to 06/30/2018 No Yes breakdown of skin L97.221 Non-pressure chronic ulcer of left calf limited to 09/30/2016 No  Yes breakdown of skin I87.333 Chronic venous hypertension (idiopathic) with ulcer 01/22/2016 No Yes and inflammation of bilateral lower extremity I89.0 Lymphedema, not elsewhere classified 01/22/2016 No Yes E11.622 Type 2 diabetes mellitus with other skin ulcer 01/22/2016 No Yes 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, 01/22/2016 No Yes unspecified L03.116 Cellulitis of left lower limb 04/01/2017 No Yes Inactive Problems ICD-10 Code Description Active Date Inactive Date 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 06/30/2017 skin L97.521 Non-pressure chronic ulcer of other part of left foot limited to 04/27/2018 04/27/2018 breakdown of skin L03.115 Cellulitis of right lower limb 12/22/2017 12/22/2017 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 with other specified 06/30/2018 06/30/2018 severity L97.511 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 06/30/2018 06/30/2018 breakdown of skin Resolved Problems  Electronic Signature(s) Signed: 12/08/2019 10:57:26 AM By: Lenda Kelp PA-C Entered By: Lenda Kelp on 12/08/2019 10:57:25 -------------------------------------------------------------------------------- Progress Note Details Patient Name: Date of Service: [name], [name] 12/08/2019 10:15 AM Medical Record [id number] Patient Account Number: [id number] Date of Birth/Sex: Treating RN: 05/19/1951 (69 y.o. M) Primary Care Provider: Nicoletta Ba Other Clinician: Referring Provider: Treating Provider/Extender:[name], [name], [name] in Treatment: 202 Subjective Chief Complaint Information obtained from Patient patient is here for evaluation venous/lymphedema weeping History of Present Illness (HPI) Referred by PCP for consultation. Patient has long standing history of BLE venous stasis, no prior ulcerations. At beginning of month, developed cellulitis and weeping. Received IM Rocephin  followed by Keflex and resolved. Wears compression stocking, appr 6 months old. Not sure strength. No present drainage. 01/22/16 this is a patient who is a type II diabetic on insulin. 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 He tells me he religiously wears pressure stockings of uncertain strength. He was here with weeping edema about 8 months ago but did not have an open wound. Roughly a month ago he had a reopening on his bilateral legs. He is been using bandages and Neosporin. He does not complain of pain. 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 Last BUN/creatinine I have is from 11/20/15 at 13 and 1.0 respectively 01/29/16; patient arrives today having tolerated the Profore wrap. He brought in his stockings and these are 18 mmHg stockings he bought from [place]. The compression here is likely inadequate. He does not complain of pain or excessive drainage she has no systemic symptoms. The wound on the right looks improved as does the one on the left although one on the left is more substantial with still tissue at risk below the actual wound area on the bilateral posterior calf 02/05/16; patient arrives with poor edema control. He states that we did put a 4 layer compression on it last week. No weight appear 5 this. 02/12/16; the area on the posterior right Has healed. The left Has a substantial wound that has necrotic surface eschar that requires a debridement with a curette. 02/16/16;the patient called or a Nurse visit secondary to increased swelling. He had been in earlier in the week with his right leg healed. He was transitioned to is on pressure stocking on the right leg with the only open wound on the left, a substantial area on the left posterior calf. Note he has a history of severe lower extremity edema, he has a history of chronic atrial  fibrillation but not heart failure per my notes but I'll need to research this. He is not complaining of chest pain shortness of breath or orthopnea. The intake nurse noted blisters on the previously closed right leg 02/19/16; this is the patient's regular visit day. I see him on Friday with escalating edema new wounds on the right leg and clear signs of at least right ventricular heart failure. I increased his Lasix to 40 twice a day. He is returning currently in follow-up. States he is noticed a decrease in that the edema 02/26/16 patient's legs have much less edema. There is nothing really open on the right leg. The left leg has improved condition of the large superficial wound on the posterior left leg 03/04/16; edema control is very much better. The patient's right leg wounds have healed. On the left leg he continues  to have severe venous inflammation on the posterior aspect of the left leg. There is no tenderness and I don't think any of this is cellulitis. 03/11/16; patient's right leg is married healed and he is in his own stocking. The patient's left leg has deteriorated somewhat. There is a lot of erythema around the wound on the posterior left leg. There is also a significant rim of erythema posteriorly just above where the wrap would've ended there is a new wound in this location and a lot of tenderness. Can't rule out cellulitis in this area. 03/15/16; patient's right leg remains healed and he is in his own stocking. The patient's left leg is much better than last review. His major wound on the posterior aspect of his left Is almost fully epithelialized. He has 3 small injuries from the wraps. Really. Erythema seems a lot better on antibiotics 03/18/16; right leg remains healed and he is in his own stocking. The patient's left leg is much better. The area on the posterior aspect of the left calf is fully epithelialized. His 3 small injuries which were wrap injuries on the left are improved  only one seems still open his erythema has resolved 03/25/16; patient's right leg remains healed and he is in his own stocking. There is no open area today on the left leg posterior leg is completely closed up. His wrap injuries at the superior aspect of his leg are also resolved. He looks as though he has some irritation on the dorsal ankle but this is fully epithelialized without evidence of infection. 03/28/16; we discharged this patient on Monday. Transitioned him into his own stocking. There were problems almost immediately with uncontrolled swelling weeping edema multiple some of which have opened. He does not feel systemically unwell in particular no chest pain no shortness of breath and he does not feel 04/08/16; the edema is under better control with the Profore light wrap but he still has pitting edema. There is one large wound anteriorly 2 on the medial aspect of his left leg and 3 small areas on the superior posterior calf. Drainage is not excessive he is tolerating a Profore light well 04/15/16; put a Profore wrap on him last week. This is controlled is edema however he had a lot of pain on his left anterior foot most of his wounds are healed 04/22/16 once again the patient has denuded areas on the left anterior foot which he states are because his wrap slips up word. He saw his primary physician today is on Lasix 40 twice a day and states that he his weight is down 20 pounds over the last 3 months. 04/29/16: Much improved. left anterior foot much improved. He is now on Lasix 80 mg per day. Much improved edema control 05/06/16; I was hoping to be able to discharge him today however once again he has blisters at a low level of where the compression was placed last week mostly on his left lateral but also his left medial leg and a small area on the anterior part of the left foot. 05/09/16; apparently the patient went home after his appointment on 7/4 later in the evening developing pain in  his upper medial thigh together with subjective fever and chills although his temperature was not taken. The pain was so intense he felt he would probably have to call 911. However he then remembered that he had leftover doxycycline from a previous round of antibiotics and took these. By the next morning he felt a  lot better. He called and spoke to one of our nurses and I approved doxycycline over the phone thinking that this was in relation to the wounds we had previously seen although they were definitely were not. The patient feels a lot better old fever no chills he is still working. Blood sugars are reasonably controlled 05/13/16; patient is back in for review of his cellulitis on his anterior medial upper thigh. He is taking doxycycline this is a lot better. Culture I did of the nodular area on the dorsal aspect of his foot grew MRSA this also looks a lot better. 05/20/16; the patient is cellulitis on the medial upper thigh has resolved. All of his wound areas including the left anterior foot, areas on the medial aspect of the left calf and the lateral aspect of the calf at all resolved. He has a new blister on the left dorsal foot at the level of the fourth toe this was excised. No evidence of infection 05/27/16; patient continues to complain weeping edema. He has new blisterlike wounds on the left anterior lateral and posterior lateral calf at the top of his wrap levels. The area on his left anterior foot appears better. He is not complaining of fever, pain or pruritus in his feet. 05/30/16; the patient's blisters on his left anterior leg posterior calf all look improved. He did not increase the Lasix 100 mg as I suggested because he was going to run out of his 40 mg tablets. He is still having weeping edema of his toes 06/03/16; I renewed his Lasix at 80 mg once a day as he was about to run out when I last saw him. He is on 80 mg of Lasix now. I have asked him to cut down on the excessive  amount of water he was drinking and asked him to drink according to his thirst mechanisms 06/12/2016 -- was seen 2 days ago and was supposed to wear his compression stockings at home but he is developed lymphedema and superficial blisters on the left lower extremity and hence came in for a review 06/24/16; the remaining wound is on his left anterior leg. He still has edema coming from between his toes. There is lymphedema here however his edema is generally better than when I last saw this. He has a history of atrial fibrillation but does not have a known history of congestive heart failure nevertheless I think he probably has this at least on a diastolic basis. 07/01/16 I reviewed his echocardiogram from January 2017. This was essentially normal. He did not have LVH, EF of 55-60%. His right ventricular function was normal although he did have trivial tricuspid and pulmonic regurgitation. This is not audible on exam however. I increased his Lasix to do massive edema in his legs well above his knees I think in early July. He was also drinking an excessive amount of water at the time. 07/15/16; missed his appointment last week because of the Labor Day holiday on Monday. He could not get another appointment later in the week. Started to feel the wrap digging in superiorly so we remove the top half and the bottom half of his wrap. He has extensive erythema and blistering superiorly in the left leg. Very tender. Very swollen. Edema in his foot with leaking edema fluid. He has not been systemically unwell 07/22/16; the area on the left leg laterally required some debridement. The medial wounds look more stable. His wrap injury wounds appear to have healed. Edema and his  foot is better, weeping edema is also better. He tells me he is meeting with the supplier of the external compression pumps at work 08/05/16; the patient was on vacation last week in [place] [facility]. His wrap is been on for an extended period of  time. Also over the weekend he developed an extensive area of tender erythema across his anterior medial thigh. He took to doxycycline yesterday that he had leftover from a previous prescription. The patient complains of weeping edema coming out of his toes 08/08/16; I saw this patient on 10/2. He was tender across his anterior thigh. I put him on doxycycline. He returns today in follow-up. He does not have any open wounds on his lower leg, he still has edema weeping into his toes. 08/12/16; patient was seen back urgently today to follow-up for his extensive left thigh cellulitis/erysipelas. He comes back with a lot less swelling and erythema pain is much better. I believe I gave him Augmentin and Cipro. His wrap was cut down as he stated a roll down his legs. He developed blistering above the level of the wrap that remained. He has 2 open blisters and 1 intact. 08/19/16; patient is been doing his primary doctor who is increased his Lasix from 40-80 once a day or 80 already has less edema. Cellulitis has remained improved in the left thigh. 2 open areas on the posterior left calf 08/26/16; he returns today having new open blisters on the anterior part of his left leg. He has his compression pumps but is not yet been shown how to use some vital representative from the supplier. 09/02/16 patient returns today with no open wounds on the left leg. Some maceration in his plantar toes 09/10/2016 -- Dr. Leanord Hawking had recently discharged him on 09/02/2016 and he has come right back with redness swelling and some open ulcers on his left lower extremity. He says this was caused by trying to apply his compression stockings and he's been unable to use this and has not been able to use his lymphedema pumps. He had some doxycycline leftover and he has started on this a few days ago. 09/16/16; there are no open wounds on his leg on the left and no evidence of cellulitis. 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 He does not complain of symptoms here. I am not clear use using his external compression pumps. 09/23/16; I have not seen this patient in 2 weeks. He canceled his appointment 10 days ago as he was going on vacation. He tells me that on Monday he noticed a large area on his posterior left leg which is been draining copiously and is reopened into a large wound. He is been using ABDs and the external part of his juxtalite, according to our nurse this was not on properly. 10/07/16; Still a substantial area on the posterior left leg. Using silver alginate 10/14/16; in general better although there is still open area which looks healthy. Still using silver alginate. He reminds me that this happen before he left for [facility]. Today while he was showering in the morning. He had been using his juxtalite's 10/21/16; the area on his posterior left leg is fully epithelialized. However he arrives today with a large area of tender erythema in his medial and posterior left thigh just above the knee. I have marked the area. Once again he is reluctant to consider hospitalization. I treated him with oral antibiotics in the past  for a similar situation with resolution I think with doxycycline however this area it seems more extensive to me. He is not complaining of fever but does have chills and says states he is thirsty. His blood sugar today was in the 140s at home 10/25/16 the area on his posterior left leg is fully epithelialized although there is still some weeping edema. The large area of tenderness and erythema in his medial and posterior left thigh is a lot less tender although there is still a lot of swelling in this thigh. He states he feels a lot better. He is on doxycycline and Augmentin that I started last week. This will continued until Tuesday, December 26. I have ordered a duplex ultrasound of the left thigh rule out DVT whether there is an  abscess something that would need to be drained I would also like to know. 11/01/16; he still has weeping edema from a not fully epithelialized area on his left posterior calf. Most of the rest of this looks a lot better. He has completed his antibiotics. His thigh is a lot better. Duplex ultrasound did not show a DVT in the thigh 11/08/16; he comes in today with more Denuded surface epithelium from the posterior aspect of his calf. There is no real evidence of cellulitis. The superior aspect of his wrap appears to have put quite an indentation in his leg just below the knee and this may have contributed. He does not complain of pain or fever. We have been using silver alginate as the primary dressing. The area of cellulitis in the right thigh has totally resolved. He has been using his compression stockings once a week 11/15/16; the patient arrives today with more loss of epithelium from the posterior aspect of his left calf. He now has a fairly substantial wound in this area. The reason behind this deterioration isn't exactly clear although his edema is not well controlled. He states he feels he is generally more swollen systemically. He is not complaining of chest pain shortness of breath fever. Tells me he has an appointment with his primary physician in early February. He is on 80 mg of oral Lasix a day. He claims compliance with the external compression pumps. He is not having any pain in his legs similar to what he has with his recurrent cellulitis 11/22/16; the patient arrives a follow-up of his large area on his left lateral calf. This looks somewhat better today. He came in earlier in the week for a dressing change since I saw him a week ago. He is not complaining of any pain no shortness of breath no chest pain 11/28/16; the patient arrives for follow-up of his large area on the left lateral calf this does not look better. In fact it is larger weeping edema. The surface of the wound does not  look too bad. We have been using silver alginate although I'm not certain that this is a dressing issue. 12/05/16; again the patient follows up for a large wound on the left lateral and left posterior calf this does not look better. There continues to be weeping edema necrotic surface tissue. More worrisome than this once again there is erythema below the wound involving the distal Achilles and heel suggestive of cellulitis. He is on his feet working most of the day of this is not going well. We are changing his dressing twice a week to facilitate the drainage. 12/12/16; not much change in the overall dimensions of the large area on the  left posterior calf. This is very inflamed looking. I gave him an. Doxycycline last week does not really seem to have helped. He found the wrap very painful indeed it seems to of dog into his legs superiorly and perhaps around the heel. He came in early today because the drainage had soaked through his dressings. 12/19/16- patient arrives for follow-up evaluation of his left lower extremity ulcers. He states that he is using his lymphedema pumps once daily when there is "no drainage". He admits to not using his lipedema pumps while under current treatment. His blood sugars have been consistently between 150-200. 12/26/16; the patient is not using his compression pumps at home because of the wetness on his feet. I've advised him that I think it's important for him to use this daily. He finds his feet too wet, he can put a plastic bag over his legs while he is in the pumps. Otherwise I think will be in a vicious circle. We are using silver alginate to the major area on his left posterior calf 01/02/17; the patient's posterior left leg has further of all into 3 open wounds. All of them covered with a necrotic surface. He claims to be using his compression pumps once a day. His edema control is marginal. Continue with silver alginate 01/10/17; the patient's left posterior leg  actually looks somewhat better. There is less edema, less erythema. Still has 3 open areas covered with a necrotic surface requiring debridement. He claims to be using his compression pumps once a day his edema control is better 01/17/17; the patient's left posterior calf look better last week when I saw him and his wrap was changed 2 days ago. He has noted increasing pain in the left heel and arrives today with much larger wounds extensive erythema extending down into the entire heel area especially tender medially. He is not systemically unwell CBGs have been controlled no fever. Our intake nurse showed me limegreen drainage on his AVD pads. 01/24/17; his usual this patient responds nicely to antibiotics last week giving him Levaquin for presumed Pseudomonas. The whole entire posterior part of his leg is much better much less inflamed and in the case of his Achilles heel area much less tender. He has also had some epithelialization posteriorly there are still open areas here and still draining but overall considerably better 01/31/17- He has continue to tolerate the compression wraps. he states that he continues to use the lymphedema pumps daily, and can increase to twice daily on the weekends. He is voicing no complaints or concerns regarding his LLE ulcers 02/07/17-he is here for follow-up evaluation. He states that he noted some erythema to the left medial and anterior thigh, which he states is new as of yesterday. He is concerned about recurrent cellulitis. He states his blood sugars have been slightly elevated, this morning in the 180s 02/14/17; he is here for follow-up evaluation. When he was last here there was erythema superiorly from his posterior wound in his anterior thigh. He was prescribed Levaquin however a culture of the wound surface grew MRSA over the phone I changed him to doxycycline on Monday and things seem to be a lot better. 02/24/17; patient missed his appointment on Friday  therefore we changed his nurse visit into a physician visit today. Still using silver alginate on the large area of the posterior left thigh. He isn't new area on the dorsal left second toe 03/03/17; actually better today although he admits he has not used his external  compression pumps in the last 2 days or so because of work responsibilities over the weekend. 03/10/17; continued improvement. External compression pumps once a day almost all of his wounds have closed on the posterior left calf. Better edema control 03/17/17; in general improved. He still has 3 small open areas on the lateral aspect of his left leg however most of the area on the posterior part of his leg is epithelialized. He has better edema control. He has an ABD pad under his stocking on the right anterior lower leg although he did not let us look at that today. 03/24/17; patient arrives back in clinic today with no open areas however there are areas on the posterior left calf and anterior left calf that are less than 100% epithelialized. His edema is well controlled in the left lower leg. There is some pitting edema probably lymphedema in the left upper thigh. He uses compression pumps at home once per day. I tried to get him to do this twice a day although he is very reticent. 04/01/2017 -- for the last 2 days he's had significant redness, tenderness and weeping and came in for an urgent visit today. 04/07/17; patient still has 6 more days of doxycycline. He was seen by Dr. Meyer Russel last Wednesday for cellulitis involving the posterior aspect, lateral aspect of his Involving his heel. For the most part he is better there is less erythema and less weeping. He has been on his feet for 12 hours o2 over the weekend. Using his compression pumps once a day 04/14/17 arrives today with continued improvement. Only one area on the posterior left calf that is not fully epithelialized. He has intense bilateral venous inflammation associated with  his chronic venous insufficiency disease and secondary lymphedema. We have been using silver alginate to the left posterior calf wound In passing he tells [place] today that the right leg but we have not seen in quite some time has an open area on it but he doesn't want [place] to look at this today states he will show this to [place] next week. 04/21/17; there is no open area on his left leg although he still reports some weeping edema. He showed [place] his right leg today which is the first time we've seen this leg in a long time. He has a large area of open wound on the right leg anteriorly healthy granulation. Quite a bit of swelling in the right leg and some degree of venous inflammation. He told us about the right leg in passing last week but states that deterioration in the right leg really only happened over the weekend 04/28/17; there is no open area on the left leg although there is an irritated part on the posterior which is like a wrap injury. The wound on the right leg which was new from last week at least to [place] is a lot better. 05/05/17; still no open area on the left leg. Patient is using his new compression stocking which seems to be doing a good job of controlling the edema. He states he is using his compression pumps once per day. The right leg still has an open wound although it is better in terms of surface area. Required debridement. A lot of pain in the posterior right Achilles marked tenderness. Usually this type of presentation this patient gives concern for an active cellulitis 05/12/17; patient arrives today with his major wound from last week on the right lateral leg somewhat better. Still requiring debridement. He was using  his compression stocking on the left leg however that is reopened with superficial wounds anteriorly he did not have an open wound on this leg previously. He is still using his juxta light's once daily at night. He cannot find the time to do this in the morning as he has to  be at work by 7 AM 05/19/17; right lateral leg wound looks improved. No debridement required. The concerning area is on the left posterior leg which appears to almost have a subcutaneous hemorrhagic component to it. We've been using silver alginate to all the wounds 05/26/17; the right lateral leg wound continues to look improved. However the area on the left posterior calf is a tightly adherent surface. [place] using silver alginate. Because of the weeping edema in his legs there is very little good alternatives. 06/02/17; the patient left here last week looking quite good. Major wound on the left posterior calf and a small one on the right lateral calf. Both of these look satisfactory. He tells me that by Wednesday he had noted increased pain in the left leg and drainage. He called on Thursday and Friday to get an appointment here but we were blocked. He did not go to urgent care or his primary physician. He thinks he had a fever on Thursday but did not actually take his temperature. He has not been using his compression pumps on the left leg because of pain. I advised him to go to the emergency room today for IV antibiotics for stents of left leg cellulitis but he has refused I have asked him to take 2 days off work to keep his leg elevated and he has refused this as well. In view of this I'm going to call him and Augmentin and doxycycline. 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 However after a detailed discussion regarding the risk benefits and alternatives he refuses to get admitted to the hospital. With no other recourse I will continue him on oral antibiotics as before and hopefully he'll have his infectious disease consultation this week. 06/16/2017  -- the patient was seen today by the nurse practitioner at infectious disease Ms. Dixon. 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 He has also been advise Lotrimin cream locally. she also had wise IV antibiotics for his condition if it flares up 06/23/17; patient arrives today with drainage bilaterally although the remaining wound on the left posterior calf after cleaning up today "highlighter yellow drainage" did not look too bad. Unfortunately he has had breakdown on the right anterior leg [previously this leg had not been open and he is using a black stocking] he went to see infectious disease and is been put on clindamycin 150 mg daily, I did not verify the dose although I'm not familiar with using clindamycin in this dosing range, perhaps for prophylaxisoo 06/27/17; I brought this patient back today to follow-up on the wound deterioration on the right lower leg together with surrounding cellulitis. I started him on doxycycline 4 days ago. This area looks better however he comes in today with intense cellulitis  on the medial part of his left thigh. This is not have a wound in this area. Extremely tender. We've been using silver alginate to the wounds on the right lower leg left lower leg with bilateral 4 layer compression he is using his external compression pumps once a day 07/04/17; patient's left medial thigh cellulitis looks better. He has not been using his compression pumps as his insert said it was contraindicated with cellulitis. His right leg continues to make improvements all the wounds are still open. We only have one remaining wound on the left posterior calf. Using silver alginate to all open areas. He is on doxycycline which I started a week ago and should be finishing I gave him Augmentin after  Thursday's visit for the severe cellulitis on the left medial thigh which fortunately looks better 07/14/17; the patient's left medial thigh cellulitis has resolved. The cellulitis in his right lower calf on the right also looks better. All of his wounds are stable to improved we've been using silver alginate he has completed the antibiotics I have given him. He has clindamycin 150 mg once a day prescribed by infectious disease for prophylaxis, I've advised him to start this now. We have been using bilateral Unna boots over silver alginate to the wound areas 07/21/17; the patient is been to see infectious disease who noted his recurrent problems with cellulitis. He was not able to tolerate prophylactic clindamycin therefore he is on amoxicillin 500 twice a day. He also had a second daily dose of Lasix added By Dr. Oneta Rack but he is not taking this. Nor is he being completely compliant with his compression pumps a especially not this week. He has 2 remaining wounds one on the right posterior lateral lower leg and one on the left posterior medial lower leg. 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 The patient has Unna boots bilaterally. 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 He has bilateral Unna boots for compression and silver alginate to his wounds. Arrives today with his legs looking as good as I have seen him in quite some time. Not surprisingly his wounds look better as well with improvement on the right lateral leg venous insufficiency wound and also the left medial leg. He is still using the compression pumps once a day 08/11/17; both legs appear to be doing better wounds on the right lateral and left medial legs look better. Skin on the right leg quite good. He is  been using silver alginate as the primary dressing. I'm going to use Anasept gel calcium alginate and maintain all the secondary dressings 08/18/17; the patient continues to actually do quite well. The area on his right lateral leg is just about closed the left medial also looks better although it is still moist in this area. His edema is well controlled we have been using Anasept gel with calcium alginate and the usual secondary dressings, 4 layer compression and once daily use of his compression pumps "always been able to manage 09/01/17; the patient continues to do reasonably well in spite of his trip to [place]. The area on the right lateral leg is epithelialized. Left is much better but still open. He has more edema and more chronic erythema on the left leg [venous inflammation] 09/08/17; he arrives today with  no open wound on the right lateral leg and decently controlled edema. Unfortunately his left leg is not nearly as in his good situation as last week.he apparently had increasing edema starting on Saturday. He edema soaked through into his foot so used a plastic bag to walk around his home. The area on the medial right leg which was his open area is about the same however he has lost surface epithelium on the left lateral which is new and he has significant pain in the Achilles area of the left foot. He is already on amoxicillin chronically for prophylaxis of cellulitis in the left leg 09/15/17; he is completed a week of doxycycline and the cellulitis in the left posterior leg and Achilles area is as usual improved. He still has a lot of edema and fluid soaking through his dressings. There is no open wound on the right leg. He saw infectious disease NP today 09/22/17;As usual 1 we transition him from our compression wraps to his stockings things did not go well. He has several small open areas on the right leg. He states this was caused by the compression wrap on his skin although he  did not wear this with the stockings over them. He has several superficial areas on the left leg medially laterally posteriorly. He does not have any evidence of active cellulitis especially involving the left Achilles The patient is traveling from [facility] Saturday going to [facility]. He states he isn't attempting to get an appointment with a heel objects wound center there to change his dressings. I am not completely certain whether this will work 10/06/17; the patient came in on Friday for a nurse visit and the nurse reported that his legs actually look quite good. He arrives in clinic today for his regular follow-up visit. He has a new wound on his left third toe over the PIP probably caused by friction with his footwear. He has small areas on the left leg and a very superficial but epithelialized area on the right anterior lateral lower leg. Other than that his legs look as good as I've seen him in quite some time. We have been using silver alginate Review of systems; no chest pain no shortness of breath other than this a 10 point review of systems negative 10/20/17; seen by Dr. Meyer Russel last week. He had taken some antibiotics [doxycycline] that he had left over. Dr. Meyer Russel thought he had candida infection and declined to give him further antibiotics. He has a small wound remaining on the right lateral leg several areas on the left leg including a larger area on the left posterior several left medial and anterior and a small wound on the left lateral. The area on the left dorsal third toe looks a lot better. ROS; Gen.; no fever, respiratory no cough no sputum Cardiac no chest pain other than this 10 point review of system is negative 10/30/17; patient arrives today having fallen in the bathtub 3 days ago. It took him a while to get up. He has pain and maceration in the wounds on his left leg which have deteriorated. He has not been using his pumps he also has some maceration on the right  lateral leg. 11/03/17; patient continues to have weeping edema especially in the left leg. This saturates his dressings which were just put on on 12/27. As usual the doxycycline seems to take care of the cellulitis on his lower leg. He is not complaining of fever, chills, or other systemic symptoms. He  states his leg feels a lot better on the doxycycline I gave him empirically. He also apparently gets injections at his primary doctor's officeo Rocephin for cellulitis prophylaxis. I didn't ask him about his compression pump compliance today I think that's probably marginal. Arrives in the clinic with all of his dressings primary and secondary macerated full of fluid and he has bilateral edema 11/10/17; the patient's right leg looks some better although there is still a cluster of wounds on the right lateral. The left leg is inflamed with almost circumferential skin loss medially to laterally although we are still maintaining anteriorly. He does not have overt cellulitis there is a lot of drainage. He is not using compression pumps. We have been using silver alginate to the wound areas, there are not a lot of options here 11/17/17; the patient's right leg continues to be stable although there is still open wounds, better than last week. The inflammation in the left leg is better. Still loss of surface layer epithelium especially posteriorly. There is no overt cellulitis in the amount of edema and his left leg is really quite good, tells me he is using his compression pumps once a day. 11/24/17; patient's right leg has a small superficial wound laterally this continues to improve. The inflammation in the left leg is still improving however we have continuous surface layer epithelial loss posteriorly. There is no overt cellulitis in the amount of edema in both legs is really quite good. He states he is using his compression pumps on the left leg once a day for 5 out of 7 days 12/01/17; very small  superficial areas on the right lateral leg continue to improve. Edema control in both legs is better today. He has continued loss of surface epithelialization and left posterior calf although I think this is better. We have been using silver alginate with large number of absorptive secondary dressings 4 layer on the left Unna boot on the right at his request. He tells me he is using his compression pumps once a day 12/08/17; he has no open area on the right leg is edema control is good here. ooOn the left leg however he has marked erythema and tenderness breakdown of skin. He has what appears to be a wrap injury just distal to the popliteal fossa. 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 There was swelling erythema and breakdown in the right leg. He has much worse skin breakdown in the left leg as well multiple open areas medially and posteriorly as well as laterally. He tells me he has been using his compression pumps but tells me he feels that the drainage out of his leg is worse when he uses a compression pumps. To be fair to him he is been saying this for a while however I don't know that I have really been listening to this. I wonder if the compression pumps are working properly 12/22/17;. Once again he arrives with severe erythema, weeping edema from the left greater than right leg. Noncompliance with compression pumps. New this visit he is complaining of pain on the lateral aspect of the right leg and the medial aspect of his right thigh. He apparently saw his cardiologist Dr. [name] who was ordered  an echocardiogram area and I think this is a step in the right direction 12/25/17; started his doxycycline Monday night.  There is still intense erythema of the right leg especially in the anterior thigh although there is less tenderness. The erythema around the wound on the right lateral calf also is less tender. He still complaining of pain in the left heel. His wounds are about the same right lateral left medial left lateral. Superficial but certainly not close to closure. He denies being systemically unwell no fever chills no abdominal pain no diarrhea 12/29/17; back in follow-up of his extensive right calf and right thigh cellulitis. I added amoxicillin to cover possible doxycycline resistant strep. This seems to of done the trick he is in much less pain there is much less erythema and swelling. He has his echocardiogram at 11:00 this morning. X-ray of the left heel was also negative. 01/05/18; the patient arrived with his edema under much better control. Now that he is retired he is able to use his compression pumps daily and sometimes twice a day per the patient. He has a wound on the right leg the lateral wound looks better. Area on the left leg also looks a lot better. He has no evidence of cellulitis in his bilateral thighs I had a quick peak at his echocardiogram. He is in normal ejection fraction and normal left ventricular function. He has moderate pulmonary hypertension moderately reduced right ventricular function. One would have to wonder about chronic sleep apnea although he says he doesn't snore. He'll review the echocardiogram with his cardiologist. 01/12/18; the patient arrives with the edema in both legs under exemplary control. He is using his compression pumps daily and sometimes twice daily. His wound on the right lateral leg is just about closed. He still has some weeping areas on the posterior left calf and lateral left calf although everything is just about closed here as well. I have spoken with Aldean Baker who is the patient's nurse practitioner and infectious disease. She was concerned  that the patient had not understood that the parenteral penicillin injections he was receiving for cellulitis prophylaxis was actually benefiting him. I don't think the patient actually saw that I would tend to agree we were certainly dealing with less infections although he had a serious one last month. 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 He is healed. He'll be placed in juxtalite compression wraps and increase his lymphedema pumps to twice daily. We will follow up again next week to ensure there are no issues with the new regiment. 01/20/18-he is here for evaluation of bilateral lower extremity weeping edema. Yesterday he was placed in compression wrap to the right lower extremity and compression stocking to left lower shrubbery. He states he uses lymphedema pumps last night and again this morning and noted a blister to the left lower extremity. On exam he was noted to have drainage to the right lower extremity. He will be placed in Unna boots bilaterally and follow-up next week 01/26/18; patient was actually discharged a week ago to his own juxta light stockings only to return the next day with bilateral lower extremity weeping edema.he was placed in bilateral Unna boots. He arrives today with pain in the back of his left leg. There is no open area on the right leg however there is a linear/wrap injury on the left leg and weeping edema on the left leg posteriorly. I spoke with infectious disease about 10 days ago.  They were disappointed that the patient elected to discontinue prophylactic intramuscular penicillin shots as they felt it was particularly beneficial in reducing the frequency of his cellulitis. I discussed this with the patient today. He does not share this view. He'll definitely need antibiotics today. Finally he is traveling to [place] and trauma leaving this Saturday and returning a week later and he does not travel with his pumps. He is  going by car 01/30/18; patient was seen 4 days ago and brought back in today for review of cellulitis in the left leg posteriorly. I put him on amoxicillin this really hasn't helped as much as I might like. He is also worried because he is traveling to [facility] trauma by car. Finally we will be rewrapping him. There is no open area on the right leg over his left leg has multiple weeping areas as usual 02/09/18; The same wrap on for 10 days. He did not pick up the last doxycycline I prescribed for him. He apparently took 4 days worth he already had. There is nothing open on his right leg and the edema control is really quite good. He's had damage in the left leg medially and laterally especially probably related to the prolonged use of Unna boots 02/12/18; the patient arrived in clinic today for a nurse visit/wrap change. He complained of a lot of pain in the left posterior calf. He is taking doxycycline that I previously prescribed for him. Unfortunately even though he used his stockings and apparently used to compression pumps twice a day he has weeping edema coming out of the lateral part of his right leg. This is coming from the lower anterior lateral skin area. 02/16/18; the patient has finished his doxycycline and will finish the amoxicillin 2 days. The area of cellulitis in the left calf posteriorly has resolved. He is no longer having any pain. He tells me he is using his compression pumps at least once a day sometimes twice. 02/23/18; the patient finished his doxycycline and Amoxil last week. On Friday he noticed a small erythematous circle about the size of a quarter on the left lower leg just above his ankle. This rapidly expanded and he now has erythema on the lateral and posterior part of the thigh. This is bright red. Also has an area on the dorsal foot just above his toes and a tender area just below the left popliteal fossa. He came off his prophylactic penicillin injections at his own  insistence one or 2 months ago. This is obviously deteriorated since then 03/02/18; patient is on doxycycline and Amoxil. Culture I did last week of the weeping area on the back of his left calf grew group B strep. I have therefore renewed the amoxicillin 500 3 times a day for a further week. He has not been systemically unwell. Still complaining of an area of discomfort right under his left popliteal fossa. There is no open wound on the right leg. He tells me that he is using his pumps twice a day on most days 03/09/18; patient arrives in clinic today completing his amoxicillin today. The cellulitis on his left leg is better. Furthermore he tells me that he had intramuscular penicillin shots that his primary care office today. However he also states that the wrap on his right leg fell down shortly after leaving clinic last week. He developed a large blister that was present when he came in for a nurse visit later in the week and then he developed intense discomfort around  this area.He tells me he is using his compression pumps 03/16/18; the patient has completed his doxycycline. The infectious part of this/cellulitis in the left heel area left popliteal area is a lot better. He has 2 open areas on the right calf. Still areas on the left calf but this is a lot better as well. 03/24/18; the patient arrives complaining of pain in the left popliteal area again. He thinks some of this is wrap injury. He has no open area on the right leg and really no open area on the left calf either except for the popliteal area. He claims to be compliant with the compression pumps 03/31/18; I gave him doxycycline last week because of cellulitis in the left popliteal area. This is a lot better although the surface epithelium is denuded off and response to this. He arrives today with uncontrolled edema in the right calf area as well as a fingernail injury in the right lateral calf. There is only a few open areas on the  left 04/06/18; I gave him amoxicillin doxycycline over the last 2 weeks that the amoxicillin should be completing currently. He is not complaining of any pain or systemic symptoms. The only open areas see has is on the right lateral lower leg paradoxically I cannot see anything on the left lower leg. He tells me he is using his compression pumps twice a day on most days. Silver alginate to the wounds that are open under 4 layer compression 04/13/18; he completed antibiotics and has no new complaints. Using his compression pumps. Silver alginate that anything that's opened 04/20/18; he is using his compression pumps religiously. Silver alginate 4 layer compression anything that's opened. He comes in today with no open wounds on the left leg but 3 on the right including a new one posteriorly. He has 2 on the right lateral and one on the right posterior. He likes Unna boots on the right leg for reasons that aren't really clear we had the usual 4 layer compression on the left. It may be necessary to move to the 4 layer compression on the right however for now I left them in the Unna boots 04/27/18; he is using his compression pumps at least once a day. He has still the wounds on the right lateral calf. The area right posteriorly has closed. He does not have an open wound on the left under 4 layer compression however on the dorsal left foot just proximal to the toes and the left third toe 2 small open areas were identified 05/11/18; he has not uses compression pumps. The areas on the right lateral calf have coalesced into one large wound necrotic surface. On the left side he has one small wound anteriorly however the edema is now weeping out of a large part of his left leg. He says he wasn't using his pumps because of the weeping fluid. I explained to him that this is the time he needs to pump more 05/18/18; patient states he is using his compression pumps twice a day. The area on the right lateral large  wound albeit superficial. On the left side he has innumerable number of small new wounds on the left calf particularly laterally but several anteriorly and medially. All these appear to have healthy granulated base these look like the remnants of blisters however they occurred under compression. The patient arrives in clinic today with his legs somewhat better. There is certainly less edema, less multiple open areas on the left calf and the  right anterior leg looks somewhat better as well superficial and a little smaller. However he relates pain and erythema over the last 3-4 days in the thigh and I looked at this today. He has not been systemically unwell no fever no chills no change in blood sugar values 05/25/18; comes in today in a better state. The severe cellulitis on his left leg seems better with the Keflex. Not as tender. He has not been systemically unwell ooHard to find an open wound on the left lower leg using his compression pumps twice a day ooThe confluent wounds on his right lateral calf somewhat better looking. These will ultimately need debridement I didn't do this today. 06/01/18; the severe cellulitis on the left anterior thigh has resolved and he is completed his Keflex. ooThere is no open wound on the left leg however there is a superficial excoriation at the base of the third toe dorsally. Skin on the bottom of his left foot is macerated looking. ooThe left the wounds on the lateral right leg actually looks some better although he did require debridement of the top half of this wound area with an open curet 06/09/18 on evaluation today patient appears to be doing poorly in regard to his right lower extremity in particular this appears to likely be infected he has very thick purulent discharge along with a bright green tent to the discharge. This makes me concerned about the possibility of pseudomonas. He's also having increased discomfort at this point on evaluation.  Fortunately there does not appear to be any evidence of infection spreading to the other location at this time. 06/16/18 on evaluation today patient appears to actually be doing fairly well. His ulcer has actually diminished in size quite significantly at this point which is good news. Nonetheless he still does have some evidence of infection he did see infectious disease this morning before coming here for his appointment. I did review the results of their evaluation and their note today. They did actually have him discontinue the Cipro and initiate treatment with linezolid at this time. He is doing this for the next seven days and they recommended a follow-up in four months with them. He is the keep a log of the need for intermittent antibiotic therapy between now and when he falls back up with infectious disease. This will help them gaze what exactly they need to do to try and help them out. 06/23/18; the patient arrives today with no open wounds on the left leg and left third toe healed. He is been using his compression pumps twice a day. On the right lateral leg he still has a sizable wound but this is a lot better than last time I saw this. 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 Has been using silver alginate under 4 layer compression 06/30/18; the only open wound he has is on the right lateral leg and this looks healthy. No debridement is required. We have been using silver alginate. He does not have an open wound on the left leg. There is apparently some drainage from the dorsal proximal third toe on the left although I see no open wound here. 07/03/18 on evaluation today patient was actually here just for a nurse visit rapid change. However when he was here on Wednesday for his rat change due to having been healed on the left and then developing blisters we initiated the  wrap again  knowing that he would be back today for [place] to reevaluate and see were at. Unfortunately he has developed some cellulitis into the proximal portion of his right lower extremity even into the region of his thigh. 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 He was placed on one as what at that point. Nonetheless he is done with that and has been tolerating it well otherwise. Doxycycline which in the past really did not seem to be effective for him. Nonetheless I think the best option may be for [place] to definitely reinitiate the antibiotics for a longer period of time. 07/07/18; since I last saw this patient a week ago he has had a difficult time. At that point he did not have an open wound on his left leg. We transitioned him into juxta light stockings. He was apparently in the clinic the next day with blisters on the left lateral and left medial lower calf. He also had weeping edema fluid. He was put back into a compression wrap. He was also in the clinic on Friday with intense erythema in his right thigh. Per the patient he was started on Bactrim however that didn't work at all in terms of relieving his pain and swelling. He has taken 3 doxycycline that he had left over from last time and that seems to of helped. He has blistering on the right thigh as well. 07/14/18; the erythema on his right thigh has gotten better with doxycycline that he is finishing. 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 Presumably this cellulitis in the thigh was not related to that although I think this is a bit concerning going forward. He still has an area on the right lateral calf the blister on the right medial calf just below the knee that was discussed above. On the left 2 small open areas left medial and left lateral. Edema control is adequate. He is using his compression pumps twice a day 07/20/18;  continued improvement in the condition of both legs especially the edema in his bilateral thighs. He tells me he is been losing weight through a combination of diet and exercise. He is using his compression pumps twice a day. So overall she made to the remaining wounds 07/27/2018; continued improvement in condition of both legs. His edema is well controlled. The area on the right lateral leg is just about closed he had one blisters show up on the medial left upper calf. We have him in 4 layer compression. He is going on a 10-day trip to [place], [place] and [place]. He will be driving. He wants to wear Unna boots because of the lessening amount of constriction. He will not use compression pumps while he is away 08/05/18 on evaluation today patient actually appears to be doing decently well all things considered in regard to his bilateral lower extremities. The worst ulcer is actually only posterior aspect of his left lower extremity with a four layer compression wrap cut into his leg a couple weeks back. He did have a trip and actually had [employer] for the trip that he is worn since he was last here. Nonetheless he feels like the [employer] actually do better for him his swelling is up a little bit but he also with his trip was not taking his Lasix on a regular set schedule like  he was supposed to be. He states that obviously the reason being that he cannot drive and keep going without having to urinate too frequently which makes it difficult. He did not have his pumps with him while he was away either which I think also maybe playing a role here too. 08/13/2018; the patient only has a small open wound on the right lateral calf which is a big improvement in the last month or 2. He also has the area posteriorly just below the posterior fossa on the left which I think was a wrap injury from several weeks ago. He has no current evidence of cellulitis. He tells me he is back into his  compression pumps twice a day. He also tells me that while he was at the laundromat somebody stole a section of his extremitease stockings 08/20/2018; back in the clinic with a much improved state. He only has small areas on the right lateral mid calf which is just about healed. This was is more substantial area for quite a prolonged period of time. He has a small open area on the left anterior tibia. The area on the posterior calf just below the popliteal fossa is closed today. He is using his compression pumps twice a day 08/28/2018; patient has no open wound on the right leg. He has a smattering of open areas on the calf with some weeping lymphedema. More problematically than that it looks as though his wraps of slipped down in his usual he has very angry upper area of edema just below the right medial knee and on the right lateral calf. He has no open area on his feet. The patient is traveling to [facility] next week. I will send him in an antibiotic. We will continue to wrap the right leg. We ordered extremitease stockings for him last week and I plan to transition the right leg to a stocking when he gets home which will be in 10 days time. As usual he is very reluctant to take his pumps with him when he travels 09/07/2018; patient returns from [facility]. He shows me a picture of his left leg in the mid part of his trip last week with intense fire engine erythema. The picture look bad enough I would have considered sending him to the hospital. Instead he went to the wound care center in [facility]. They did not prescribe him antibiotics but he did take some doxycycline he had leftover from a previous visit. I had given him trimethoprim sulfamethoxazole before he left this did not work according to the patient. This is resulted in some improvement fortunately. He comes back with a large wound on the left posterior calf. Smaller area on the left anterior tibia. Denuded  blisters on the dorsal left foot over his toes. Does not have much in the way of wounds on the right leg although he does have a very tender area on the right posterior area just below the popliteal fossa also suggestive of infection. He promises me he is back on his pumps twice a day 09/15/2018; the intense cellulitis in his left lower calf is a lot better. The wound area on the posterior left calf is also so better. However he has reasonably extensive wounds on the dorsal aspect of his second and third toes and the proximal foot just at the base of the toes. There is nothing open on the right leg 09/22/2018; the patient has excellent edema control in his legs bilaterally. He is  using his external compression pumps twice a day. He has no open area on the right leg and only the areas in the left foot dorsally second and third toe area on the left side. He does not have any signs of active cellulitis. 10/06/2018; the patient has good edema control bilaterally. He has no open wound on the right leg. There is a blister in the posterior aspect of his left calf that we had to deal with today. He is using his compression pumps twice a day. There is no signs of active cellulitis. We have been using silver alginate to the wound areas. He still has vulnerable areas on the base of his left first second toes dorsally He has a his extremities stockings and we are going to transition him today into the stocking on the right leg. He is cautioned that he will need to continue to use the compression pumps twice a day. If he notices uncontrolled edema in the right leg he may need to go to 3 times a day. 10/13/2018; the patient came in for a nurse check on Friday he has a large flaccid blister on the right medial calf just below the knee. We unroofed this. He has this and a new area underneath the posterior mid calf which was undoubtedly a blister as well. He also has several small areas on the right which is the  area we put his extremities stocking on. 10/19/2018; the patient went to see infectious disease this morning I am not sure if that was a routine follow-up in any case the doxycycline I had given him was discontinued and started on linezolid. He has not started this. It is easy to look at his left calf and the inflammation and think this is cellulitis however he is very tender in the tissue just below the popliteal fossa and I have no doubt that there is infection going on here. He states the problem he is having is that with the compression pumps the edema goes down and then starts walking the wrap falls down. We will see if we can adhere this. He has 1 or 2 minuscule open areas on the right still areas that are weeping on the posterior left calf, the base of his left second and third toes 10/26/18; back today in clinic with quite of skin breakdown in his left anterior leg. This may have been infection the area below the popliteal fossa seems a lot better however tremendous epithelial loss on the left anterior mid tibia area over quite inexpensive tissue. He has 2 blisters on the right side but no other open wound here. 10/29/2018; came in urgently to see [place] today and we worked him in for review. He states that the 4 layer compression on the right leg caused pain he had to cut it down to roughly his mid calf this caused swelling above the wrap and he has blisters and skin breakdown today. As a result of the pain he has not been using his pumps. Both legs are a lot more edematous and there is a lot of weeping fluid. 11/02/18; arrives in clinic with continued difficulties in the right leg> left. Leg is swollen and painful. multiple skin blisters and new open areas especially laterally. He has not been using his pumps on the right leg. 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 He is not systemically unwell. 11/09/2018; the patient claims he is being  compliant with his pumps. He  is finished the doxycycline I gave him last week. Culture I did of the wound on the right lateral leg showed a few very resistant methicillin staph aureus. This was resistant to doxycycline. Nevertheless he states the pain in the leg is a lot better which makes me wonder if the cultured organism was not really what was causing the problem nevertheless this is a very dangerous organism to be culturing out of any wound. His right leg is still a lot larger than the left. He is using an Radio broadcast assistant on this area, he blames a 4-layer compression for causing the original skin breakdown which I doubt is true however I cannot talk him out of it. We have been using silver alginate to all of these areas which were initially blisters 11/16/2018; patient is being compliant with his external compression pumps at twice a day. Miraculously he arrives in clinic today with absolutely no open wounds. He has better edema control on the left where he has been using 4 layer compression versus wound of wounds on the right and I pointed this out to him. There is no inflammation in the skin in his lower legs which is also somewhat unusual for him. There is no open wounds on the dorsal left foot. He has extremitease stockings at home and I have asked him to bring these in next week. 11/25/18 patient's lower extremity on examination today on the left appears for the most part to be wound free. He does have an open wound on the lateral aspect of the right lower extremity but this is minimal compared to what I've seen in past. He does request that we go ahead and wrap the left leg as well even though there's nothing open just so hopefully it will not reopen in short order. 1/28; patient has superficial open wounds on the right lateral calf left anterior calf and left posterior calf. His edema control is adequate. He has an area of very tender erythematous skin at the superior upper part of his  calf compatible with his recurrent cellulitis. We have been using silver alginate as the primary dressing. He claims compliance with his compression pumps 2/4; patient has superficial open wounds on numerous areas of his left calf and again one on the left dorsal foot. The areas on the right lateral calf have healed. The cellulitis that I gave him doxycycline for last week is also resolved this was mostly on the left anterior calf just below the tibial tuberosity. His edema looks fairly well-controlled. He tells me he went to see his primary doctor today and had blood work ordered 2/11; once again he has several open areas on the left calf left tibial area. Most of these are small and appear to have healthy granulation. He does not have anything open on the right. The edema and control in his thighs is pretty good which is usually a good indication he has been using his pumps as requested. 2/18; he continues to have several small areas on the left calf and left tibial area. Most of these are small healthy granulation. We put him in his stocking on the right leg last week and he arrives with a superficial open area over the right upper tibia and a fairly large area on the right lateral tibia in similar condition. His edema control actually does not look too bad, he claims to be using his compression pumps twice a day 2/25. Continued small areas on the left calf and left tibial area.  New areas especially on the right are identified just below the tibial tuberosity and on the right upper tibia itself. There are also areas of weeping edema fluid even without an obvious wound. He does not have a considerable degree of lymphedema but clearly there is more edema here than his skin can handle. He states he is using the pumps twice a day. We have an Unna boot on the right and 4 layer compression on the left. 3/3; he continues to have an area on the right lateral calf and right posterior calf just below the  popliteal fossa. There is a fair amount of tenderness around the wound on the popliteal fossa but I did not see any evidence of cellulitis, could just be that the wrap came down and rubbed in this area. ooHe does not have an open area on the left leg however there is an area on the left dorsal foot at the base of the third toe ooWe have been using silver alginate to all wound areas 3/10; he did not have an open area on his left leg last time he was here a week ago. Today he arrives with a horizontal wound just below the tibial tuberosity and an area on the left lateral calf. He has intense erythema and tenderness in this area. The area is on the right lateral calf and right posterior calf better than last week. We have been using silver alginate as usual 3/18 - Patient returns with 3 small open areas on left calf, and 1 small open area on right calf, the skin looks ok with no significant erythema, he continues the UNA boot on right and 4 layer compression on left. The right lateral calf wound is closed , the right posterior is small area. we will continue silver alginate to the areas. 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 He is been tolerating the dressing changes without complication. Fortunately she has made excellent improvement in regard to the overall status of his wounds. Unfortunately every time we cease wrapping him he ends up reopening in causing more significant issues at that point. Again I'm unsure of the best direction to take although I think the lymphedema clinic may be appropriate for him. 02/03/19 on evaluation today patient appears to be doing well in regard to the wounds that we saw him for last week unfortunately he has a new area on the proximal portion of his right  medial/posterior lower extremity where the wrap somewhat slowed down and caused swelling and a blister to rub and open. Unfortunately this is the only opening that he has on either leg at this point. 02/17/19 on evaluation today patient's bilateral lower extremities appear to be doing well. He still completely healed in regard to the left lower extremity. In regard to the right lower extremity the area where the wrap and slid down and caused the blister still seems to be slightly open although this is dramatically better than during the last evaluation two weeks ago. I'm very pleased with the way this stands overall. 03/03/19 on evaluation today patient appears to be doing well in regard to his right lower extremity in general although he did have a new blister open this does not appear to be showing any evidence of active infection at this time. Fortunately there's No fevers,  chills, nausea, or vomiting noted at this time. Overall I feel like he is making good progress it does feel like that the right leg will we perform the [employer] seems to do with a bit better than three layer wrap on the left which slid down on him. We may switch to doing bilateral in the book wraps. 5/4; I have not seen Mr. Marcussen in quite some time. According to our case manager he did not have an open wound on his left leg last week. He had 1 remaining wound on the right posterior medial calf. He arrives today with multiple openings on the left leg probably were blisters and/or wrap injuries from Unna boots. I do not think the Unna boot's will provide adequate compression on the left. I am also not clear about the frequency he is using the compression pumps. 03/17/19 on evaluation today patient appears to be doing excellent in regard to his lower extremities compared to last week's evaluation apparently. He had gotten significantly worse last week which is unfortunate. The [employer] wrap on the left did not seem to do very  well for him at all and in fact it didn't control his swelling significantly enough he had an additional outbreak. Subsequently we go back to the four layer compression wrap on the left. This is good news. At least in that he is doing better and the wound seem to be killing him. He still has not heard anything from the lymphedema clinic. 03/24/19 on evaluation today patient actually appears to be doing much better in regard to his bilateral lower Trinity as compared to last week when I saw him. Fortunately there's no signs of active infection at this time. He has been tolerating the dressing changes without complication. Overall I'm extremely pleased with the progress and appearance in general. 04/07/19 on evaluation today patient appears to be doing well in regard to his bilateral lower extremities. His swelling is significantly down from where it was previous. With that being said he does have a couple blisters still open at this point but fortunately nothing that seems to be too severe and again the majority of the larger openings has healed at this time. 04/14/19 on evaluation today patient actually appears to be doing quite well in regard to his bilateral lower extremities in fact I'm not even sure there's anything significantly open at this time at any site. Nonetheless he did have some trouble with these wraps where they are somewhat irritating him secondary to the fact that he has noted that the graph wasn't too close down to the end of this foot in a little bit short as well up to his knee. Otherwise things seem to be doing quite well. 04/21/19 upon evaluation today patient's wound bed actually showed evidence of being completely healed in regard to both lower extremities which is excellent news. There does not appear to be any signs of active infection which is also good news. I'm very pleased in this regard. No fevers, chills, nausea, or vomiting noted at this time. 04/28/19 on evaluation  today patient appears to be doing a little bit worse in regard to both lower extremities on the left mainly due to the fact that when he went infection disease the wrap was not wrapped quite high enough he developed a blister above this. On the right he is a small open area of nothing too significant but again this is continuing to give him some trouble he has been were in  the Velcro compression that he has at home. 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 He's been tolerating the dressing changes without complication. Fortunately there's no signs of active infection at this time. No fevers, chills, nausea, or vomiting noted at this time. We have been trying to get an appointment with her lymphedema clinic in [facility] [name] but unfortunately nobody can get them on phone with not been able to even fax information over the patient likewise is not been able to get in touch with them. Overall I'm not sure exactly what's going on here with to reach out again today. 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 Still having a lot of drainage unfortunately. He tells me especially in the left but even on the right. There's no signs of active infection which is good news we've been using so ratcheted up to this point. 05/19/19 on evaluation today patient actually appears to be doing quite well with regard to his left lower extremity which is great news. Fortunately in regard to the right lower extremity has an issues with his wrap and he subsequently did remove this from what I'm understanding. Nonetheless long story short is what he had rewrapped once he removed it subsequently had maggots underneath this wrap whenever he came in for evaluation today. With that being said they were obviously completely cleaned away by the nursing staff. The visit today  which is excellent news. However he does appear to potentially have some infection around the right ankle region where the maggots were located as well. He will likely require anabiotic therapy today. 05/26/19 on evaluation today patient actually appears to be doing much better in regard to his bilateral lower extremities. I feel like the infection is under much better control. With that being said there were maggots noted when the wrap was removed yet again today. Again this could have potentially been left over from previous although at this time there does not appear to be any signs of significant drainage there was obviously on the wrap some drainage as well this contracted gnats or otherwise. Either way I do not see anything that appears to be doing worse in my pinion and in fact I think his drainage has slowed down quite significantly likely mainly due to the fact to his infection being under better control. 06/02/2019 on evaluation today patient actually appears to be doing well with regard to his bilateral lower extremities there is no signs of active infection at this time which is great news. With that being said he does have several open areas more so on the right than the left but nonetheless these are all significantly better than previously noted. 06/09/2019 on evaluation today patient actually appears to be doing well. His wrap stayed up and he did not cause any problems he had more drainage on the right compared to the left but overall I do not see any major issues at this time which is great news. 06/16/2019 on evaluation today patient appears to be doing excellent with regard to his lower extremities the only area that is open is a new blister that can have opened as of today on the medial ankle on the left. Other than this he really seems to be doing great I see no major issues at this point. 06/23/2019 on evaluation today patient appears to be doing quite well  with regard to his  bilateral lower extremities. In fact he actually appears to be almost completely healed there is a small area of weeping noted of the right lower extremity just above the ankle. Nonetheless fortunately there is no signs of active infection at this time which is good news. No fevers, chills, nausea, vomiting, or diarrhea. 8/24; the patient arrived for a nurse visit today but complained of very significant pain in the left leg and therefore I was asked to look at this. Noted that he did not have an open area on the left leg last week nevertheless this was wrapped. The patient states that he is not been able to put his compression pumps on the left leg because of the discomfort. He has not been systemically unwell 06/30/2019 on evaluation today patient unfortunately despite being excellent last week is doing much worse with regard to his left lower extremity today. In fact he had to come in for a nurse on Monday where his left leg had to be rewrapped due to excessive weeping Dr. Leanord Hawking placed him on doxycycline at that point. Fortunately there is no signs of active infection Systemically at this time which is good news. 07/07/2019 in regard to the patient's wounds today he actually seems to be doing well with his right lower extremity there really is nothing open or draining at this point this is great news. Unfortunately the left lower extremity is given him additional trouble at this time. There does not appear to be any signs of active infection nonetheless he does have a lot of edema and swelling noted at this point as well as blistering all of which has led to a much more poor appearing leg at this time compared to where it was 2 weeks ago when it was almost completely healed. Obviously this is a little discouraging for the patient. He is try to contact the lymphedema clinic in [place] he has not been able to get through to them. 07/14/2019 on evaluation today patient actually appears to be  doing slightly better with regard to his left lower extremity ulcers. Overall I do feel like at least at the top of the wrap that we have been placing this area has healed quite nicely and looks much better. The remainder of the leg is showing signs of improvement. Unfortunately in the thigh area he still has an open region on the left and again on the right he has been utilizing just a Band-Aid on an area that also opened on the thigh. Again this is an area that were not able to wrap although we did do an Ace wrap to provide some compression that something that obviously is a little less effective than the compression wraps we have been using on the lower portion of the leg. He does have an appointment with the lymphedema clinic in [facility] on Friday. 07/21/2019 on evaluation today patient appears to be doing better with regard to his lower extremity ulcers. He has been tolerating the dressing changes without complication. Fortunately there is no signs of active infection at this time. No fevers, chills, nausea, vomiting, or diarrhea. I did receive the paperwork from the physical therapist at the lymphedema clinic in [place]. Subsequently I signed off on that this morning and sent that back to him for further progression with the treatment plan. 07/28/2019 on evaluation today patient appears to be doing very well with regard to his right lower extremity where I do not see any open wounds at this  point. Fortunately he is feeling great as far as that is concerned as well. In regard to the left lower extremity he has been having issues with still several areas of weeping and edema although the upper leg is doing better his lower leg still I think is going require the compression wrap at this time. No fevers, chills, nausea, vomiting, or diarrhea. 08/04/2019 on evaluation today patient unfortunately is having new wounds on the right lower extremity. Again we have been using Unna boot wrap on  that side. We switched him to using his juxta lite wrap at home. With that being said he tells me he has been using it although his legs extremely swollen and to be honest really does not appear that he has been. I cannot know that for sure however. Nonetheless he has multiple new wounds on the right lower extremity at this time. Obviously we will have to see about getting this rewrapped for him today. 08/11/2019 on evaluation today patient appears to be doing fairly well with regard to his wounds. He has been tolerating the dressing changes including the compression wraps without complication. He still has a lot of edema in his upper thigh regions bilaterally he is supposed to be seeing the lymphedema clinic on the 15th of this month once his wraps arrive for the upper part of his legs. 08/18/2019 on evaluation today patient appears to be doing well with regard to his bilateral lower extremities at this point. He has been tolerating the dressing changes without complication. Fortunately there is no signs of active infection which is also good news. He does have a couple weeping areas on the first and second toe of the right foot he also has just a small area on the left foot upper leg and a small area on the left lower leg but overall he is doing quite well in my opinion. He is supposed to be getting his wraps shortly in fact tomorrow and then subsequently is seeing the lymphedema clinic next Wednesday on the 21st. Of note he is also leaving on the 25th to go on vacation for a week to the beach. For that reason and since there is some uncertainty about what there can be doing at lymphedema clinic next Wednesday I am get a make an appointment for next Friday here for [place] to see what we need to do for him prior to him leaving for vacation. 10/23; patient arrives in considerable pain predominantly in the upper posterior calf just distal to the popliteal fossa also in the wound anteriorly above the  major wound. This is probably cellulitis and he has had this recurrently in the past. He has no open wound on the right side and he has had an Radio broadcast assistant in that area. Finally I note that he has an area on the left posterior calf which by enlarge is mostly epithelialized. This protrudes beyond the borders of the surrounding skin in the setting of dry scaly skin and lymphedema. The patient is leaving for [facility] on Sunday. Per his longstanding pattern, he will not take his compression pumps with him predominantly out of fear that they will be stolen. He therefore asked that we put a Unna boot back on the right leg. He will also contact the wound care center in [facility] to see if they can change his dressing in the mid week. 11/3; patient returned from his vacation to [facility]. He was seen on 1 occasion at their wound care center.  They did a 2 layer compression system as they did not have our 4-layer wrap. I am not completely certain what they put on the wounds. They did not change the Unna boot on the right. The patient is also seeing a lymphedema specialist physical therapist in [place]. It appears that he has some compression sleeve for his thighs which indeed look quite a bit better than I am used to seeing. He pumps over these with his external compression pumps. 11/10; the patient has a new wound on the right medial thigh otherwise there is no open areas on the right. He has an area on the left leg posteriorly anteriorly and medially and an area over the left second toe. We have been using silver alginate. He thinks the injury on his thigh is secondary to friction from the compression sleeve he has. 11/17; the patient has a new wound on the right medial thigh last week. He thinks this is because he did not have a underlying stocking for his thigh juxta lite apparatus. He now has this. The area is fairly large and somewhat angry but I do not think he has underlying  cellulitis. ooHe has a intact blister on the right anterior tibial area. ooSmall wound on the right great toe dorsally ooSmall area on the medial left calf. 11/30; the patient does not have any open areas on his right leg and we did not take his juxta lite stocking off. However he states that on Friday his compression wrap fell down lodging around his upper mid calf area. As usual this creates a lot of problems for him. He called urgently today to be seen for a nurse visit however the nurse visit turned into a provider visit because of extreme erythema and pain in the left anterior tibia extending laterally and posteriorly. The area that is problematic is extensive 10/06/2019 upon evaluation today patient actually appears to be doing poorly in regard to his left lower extremity. He Dr. Leanord Hawking did place him on doxycycline this past Monday apparently due to the fact that he was doing much worse in regard to this left leg. Fortunately the doxycycline does seem to be helping. Unfortunately we are still having a very difficult time getting his edema under any type of control in order to anticipate discharge at some point. The only way were really able to control his lymphedema really is with compression wraps and that has only even seemingly temporary. He has been seeing a lymphedema clinic they are trying to help in this regard but still this has been somewhat frustrating in general for the patient. 10/13/19 on evaluation today patient appears to be doing excellent with regard to his right lower extremity as far as the wounds are concerned. His swelling is still quite extensive unfortunately. He is still having a lot of drainage from the thigh areas bilaterally which is unfortunate. He's been going to lymphedema clinic but again he still really does not have this edema under control as far as his lower extremities are concern. With regard to his left lower extremity this seems to be improving and I  do believe the doxycycline has been of benefit for him. He is about to complete the doxycycline. 10/20/2019 on evaluation today patient appears to be doing poorly in regard to his bilateral lower extremities. More in the right thigh he has a lot of irritation at this site unfortunately. In regard to the left lower extremity the wrap was not quite as high it appears and  does seem to have caused him some trouble as well. Fortunately there is no evidence of systemic infection though he does have some blue-green drainage which has me concerned for the possibility of Pseudomonas. He tells me he is previously taking Cipro without complications and he really does not care for Levaquin however due to some of the side effects he has. He is not allergic to any medications specifically antibiotics that were aware of. 10/27/2019 on evaluation today patient actually does appear to be for the most part doing better when compared to last week's evaluation. With that being said he still has multiple open wounds over the bilateral lower extremities. He actually forgot to start taking the Cipro and states that he still has the whole bottle. He does have several new blisters on left lower extremity today I think I would recommend he go ahead and take the Cipro based on what I am seeing at this point. 12/30-Patient comes at 1 week visit, 4 layer compression wraps on the left and Unna boot on the right, primary dressing Xtrasorb and silver alginate. Patient is taking his Cipro and has a few more days left probably 5-6, and the legs are doing better. He states he is using his compressions devices which I believe he has 11/10/2019 on evaluation today patient actually appears to be much better than last time I saw him 2 weeks ago. His wounds are significantly improved and overall I am very pleased in this regard. Fortunately there is no signs of active infection at this time. He is just a couple of days away from  completing Cipro. Overall his edema is much better he has been using his lymphedema pumps which I think is also helping at this point. 11/17/2019 on evaluation today patient appears to be doing excellent in regard to his wounds in general. His legs are swollen but not nearly as much as they have been in the past. Fortunately he is tolerating the compression wraps without complication. No fevers, chills, nausea, vomiting, or diarrhea. He does have some erythema however in the distal portion of his right lower extremity specifically around the forefoot and toes there is a little bit of warmth here as well. 11/24/2019 on evaluation today patient appears to be doing well with regard to his right lower extremity I really do not see any open wounds at this point. His left lower extremity does have several open areas and his right medial thigh also is open. Other than this however overall the patient seems to be making good progress and I am very pleased at this point. 12/01/2019 on evaluation today patient appears to be doing poorly at this point in regard to his left lower extremity has several new blisters despite the fact that we have him in compression wraps. In fact he had a 4-layer compression wrap, his upper thigh wrapped from lymphedema clinic, and a juxta light over top of the 4 layer compression wrap the lymphedema clinic applied and despite all this he still develop blisters underneath. Obviously this does have me concerned about the fact that unfortunately despite what we are doing to try to get wounds healed he continues to have new areas arise I do not think he is ever good to be at the point where he can realistically just use wraps at home to keep things under control. Typically when we heal him it takes about 1-2 days before he is back in the clinic with severe breakdown and blistering of his lower extremities  bilaterally. This is happened numerous times in the past. Unfortunately I think  that we may need some help as far as overall fluid overload to kind of limit what we are seeing and get things under better control. 12/08/2019 on evaluation today patient presents for follow-up concerning his ongoing bilateral lower extremity edema. Unfortunately he is still having quite a bit of swelling the compression wraps are controlling this to some degree but he did see Dr. [name] his cardiologist I do have that available for review today as far as the appointment was concerned that was on 12/06/2019. Obviously that she has been 2 days ago. The patient states that he is only been taking the Lasix 80 mg 1 time a day he had told me previously he was taking this twice a day. Nonetheless Dr. [name] recommended this be up to 80 mg 2 times a day for the patient as he did appear to be fluid overloaded. With that being said the patient states he did this yesterday and he was unable to go anywhere or do anything due to the fact that he was constantly having to urinate. Nonetheless I think that this is still good to be something that is important for him as far as trying to get his edema under control at all things that he is going to be able to just expect his wounds to get under control and things to be better without going through at least a period of time where he is trying to stabilize his fluid management in general and I think increasing the Lasix is likely the first step here. It was also mentioned the possibility that the patient may require metolazone. With that being said he wanted to have the patient take Lasix twice a day first and then reevaluating 2 months to see where things stand. Objective Constitutional Well-nourished and well-hydrated in no acute distress. Vitals Time Taken: 10:31 AM, Height: 70 in, Weight: 380.2 lbs, BMI: 54.5, Temperature: 97.7 F, Pulse: 61 bpm, Respiratory Rate: 20 breaths/min, Blood Pressure: 138/62 mmHg, Capillary Blood Glucose: 136 mg/dl. Respiratory normal  breathing without difficulty. Psychiatric this patient is able to make decisions and demonstrates good insight into disease process. Alert and Oriented x 3. pleasant and cooperative. General Notes: Patient's wounds currently appear to be doing better in regard to the lower extremities he has a small area on the left dorsal foot and his right inner thigh is open and may be a little bit more coverage as far as surface area is concerned but overall does not appear to be doing as poorly. I think these are just very small areas that would likely fill up quickly with appropriate compression and fluid management. The patient does seem somewhat depressed by the fact that he was wanting to get into going back to walk at the [facility] and had just found out with somewhere he can go that was reopened and had a walking track. With that being said he tells me he is not can be able to do this if indeed he is having to take this medication is good to be stuck at home. Integumentary (Hair, Skin) Wound #145R status is Open. Original cause of wound was Gradually Appeared. The wound is located on the Right,Medial Upper Leg. The wound measures 7cm length x 3cm width x 0.1cm depth; 16.493cm^2 area and 1.649cm^3 volume. There is no tunneling or undermining noted. There is a large amount of serosanguineous drainage noted. The wound margin is flat and intact. There is  large (67-100%) red granulation within the wound bed. There is no necrotic tissue within the wound bed. Wound #154 status is Healed - Epithelialized. Original cause of wound was Shear/Friction. The wound is located on the Left,Medial Lower Leg. The wound measures 0cm length x 0cm width x 0cm depth; 0cm^2 area and 0cm^3 volume. Wound #156 status is Open. Original cause of wound was Blister. The wound is located on the Left,Dorsal Foot. The wound measures 0.7cm length x 0.8cm width x 0.1cm depth; 0.44cm^2 area and 0.044cm^3 volume. There is no tunneling or  undermining noted. There is a medium amount of serosanguineous drainage noted. The wound margin is distinct with the outline attached to the wound base. There is large (67-100%) red, pink granulation within the wound bed. There is no necrotic tissue within the wound bed. 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 There was a Four Layer Compression Therapy Procedure by Yevonne Pax, RN. Post procedure Diagnosis Wound #156: Same as Pre-Procedure There was a Radio broadcast assistant Compression Therapy Procedure by Yevonne Pax, RN. Post procedure Diagnosis Wound #: Same as Pre-Procedure Plan Follow-up Appointments: Return Appointment in 1 week. Dressing Change Frequency: Do not change entire dressing for one week. - both lower legs Skin Barriers/Peri-Wound Care: Moisturizing lotion TCA Cream or Ointment - to legs mixed with lotion Wound Cleansing: May shower with protection. Primary Wound Dressing: Wound #145R Right,Medial Upper Leg: Calcium Alginate with Silver Wound #156 Left,Dorsal Foot: Calcium Alginate with Silver Secondary Dressing: Wound #145R Right,Medial Upper Leg: ABD pad - secured with tape Wound #156 Left,Dorsal Foot: Dry Gauze Edema Control: 4 layer compression: Left lower extremity - unna boot at upper portion of lower leg. pad bend of foot with foam Unna Boot to Right Lower Extremity Avoid standing for long periods of time Elevate legs  to the level of the heart or above for 30 minutes daily and/or when sitting, a frequency of: - throughout the day Exercise regularly Segmental Compressive Device. - lymphadema pumps 60 min 2 times per day Other: - lymphadema wraps to upper leg per [facility] clinic 1. I do believe that is good to be of utmost importance for the patient to read and follow Dr. Blanchie Dessert recommendations for the Lasix we need to get this fluid off if he has any chance that getting his legs down and overall fluid overload under control so that he can have any kind of ongoing life. Otherwise he is going to continue to have issues with significant edema and to be honest that still limit him more than a few days trying to get things under control will increase Lasix dosage and. 2. We did discuss that again the other option is potentially if he goes to the hospital due to fluid overload for them to try to work with him but again I really think that he should try the oral medications at home first as prescribed and recommended by Dr. [name]. 3. We will continue with the Unna boot to the right lower extremity and the 4 layer compression wrap to left lower  extremity at this point. We will see patient back for reevaluation in 1 week here in the clinic. If anything worsens or changes patient will contact our office for additional recommendations. Electronic Signature(s) Signed: 12/08/2019 11:22:56 AM By: Lenda Kelp PA-C Entered By: Lenda Kelp on 12/08/2019 11:22:56 -------------------------------------------------------------------------------- SuperBill Details Patient Name: Date of Service: LEOVANNI, BJORKMAN 12/08/2019 Medical Record [id number] Patient Account Number: [id number] Date of Birth/Sex: Treating RN: 1951/03/22 (69 y.o. 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 I89.0 Lymphedema, not elsewhere classified E11.622 Type 2 diabetes mellitus with other skin ulcer 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, unspecified L03.116 Cellulitis of left lower limb Facility Procedures CPT4 Code Description: [id number] (Facility Use Only) [phone] - APPLY UNNA BOOT RT Modifier: Quantity: 1 CPT4 Code Description: [id number] (Facility Use Only) 19509TO - APPLY MULTLAY COMPRS LWR LT LEG Modifier: 59 Quantity: 1 Physician Procedures CPT4: Description Modifier Quantity Code [id number] - WC PHYS LEVEL 4 - EST PT 1 ICD-10 Diagnosis Description 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

## 2019-12-10 ENCOUNTER — Ambulatory Visit: Payer: Medicare Other | Admitting: Internal Medicine

## 2019-12-13 ENCOUNTER — Other Ambulatory Visit: Payer: Self-pay

## 2019-12-13 ENCOUNTER — Other Ambulatory Visit: Payer: Self-pay | Admitting: Family Medicine

## 2019-12-13 ENCOUNTER — Telehealth: Payer: Self-pay

## 2019-12-13 MED ORDER — BD PEN NEEDLE SHORT U/F 31G X 8 MM MISC: 0 refills | Status: DC

## 2019-12-13 NOTE — Telephone Encounter (Signed)
Patient refill request  Insulin Pen Needle (B-D ULTRAFINE III SHORT PEN) 31G X 8 MM MISC [[id number]]    HYDROcodone-acetaminophen (NORCO/VICODIN) 5-325 MG    [facility] - [place], [place] - [address]

## 2019-12-13 NOTE — Telephone Encounter (Signed)
Pen needles already sent in. Called pharmacy and verified RF's available for this month. Patient advised of this.

## 2019-12-14 ENCOUNTER — Ambulatory Visit: Payer: Medicare Other | Attending: Internal Medicine

## 2019-12-14 DIAGNOSIS — Z23 Encounter for immunization: Secondary | ICD-10-CM

## 2019-12-14 NOTE — Progress Notes (Signed)
   Covid-19 Vaccination Clinic  Name:  [name]    MRN: [id number] DOB: [birth year]  12/14/2019  [name] was observed post Covid-19 immunization for 15 minutes without incidence. He was provided with Vaccine Information Sheet and instruction to access the V-Safe system.   [name] was instructed to call 911 with any severe reactions post vaccine: Marland Kitchen Difficulty breathing  . Swelling of your face and throat  . A fast heartbeat  . A bad rash all over your body  . Dizziness and weakness    Immunizations Administered    Name Date Dose VIS Date Route   Pfizer COVID-19 Vaccine 12/14/2019 11:54 AM 0.3 mL 10/15/2019 Intramuscular   Manufacturer: [employer], [employer]   Lot: [id number]   NDC: [address]-3

## 2019-12-15 ENCOUNTER — Other Ambulatory Visit: Payer: Self-pay

## 2019-12-15 ENCOUNTER — Encounter (HOSPITAL_BASED_OUTPATIENT_CLINIC_OR_DEPARTMENT_OTHER): Payer: Medicare Other | Attending: Physician Assistant | Admitting: Physician Assistant

## 2019-12-15 DIAGNOSIS — I872 Venous insufficiency (chronic) (peripheral): Secondary | ICD-10-CM | POA: Diagnosis not present

## 2019-12-15 DIAGNOSIS — L97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 right calf limited to breakdown of skin: Secondary | ICD-10-CM | POA: Diagnosis not present

## 2019-12-15 DIAGNOSIS — I10 Essential (primary) hypertension: Secondary | ICD-10-CM | POA: Diagnosis not present

## 2019-12-15 DIAGNOSIS — I482 Chronic atrial fibrillation, unspecified: Secondary | ICD-10-CM | POA: Diagnosis not present

## 2019-12-15 DIAGNOSIS — L97529 Non-pressure chronic ulcer of other part of left foot with unspecified severity: Secondary | ICD-10-CM | POA: Diagnosis not present

## 2019-12-15 DIAGNOSIS — E11622 Type 2 diabetes mellitus with other skin ulcer: Secondary | ICD-10-CM | POA: Diagnosis not present

## 2019-12-15 DIAGNOSIS — M109 Gout, unspecified: Secondary | ICD-10-CM | POA: Diagnosis not present

## 2019-12-15 DIAGNOSIS — I890 Lymphedema, not elsewhere classified: Secondary | ICD-10-CM | POA: Diagnosis not present

## 2019-12-15 DIAGNOSIS — I87333 Chronic venous hypertension (idiopathic) with ulcer and inflammation of bilateral lower extremity: Secondary | ICD-10-CM | POA: Diagnosis not present

## 2019-12-15 DIAGNOSIS — L97221 Non-pressure chronic ulcer of left calf limited to breakdown of skin: Secondary | ICD-10-CM | POA: Diagnosis not present

## 2019-12-15 DIAGNOSIS — L03116 Cellulitis of left lower limb: Secondary | ICD-10-CM | POA: Diagnosis not present

## 2019-12-15 DIAGNOSIS — E11621 Type 2 diabetes mellitus with foot ulcer: Secondary | ICD-10-CM | POA: Diagnosis not present

## 2019-12-15 DIAGNOSIS — E1140 Type 2 diabetes mellitus with diabetic neuropathy, unspecified: Secondary | ICD-10-CM | POA: Diagnosis not present

## 2019-12-15 NOTE — Progress Notes (Addendum)
[name], [name] ([id number]) Visit Report for 12/15/2019 Chief Complaint Document Details Patient Name: Date of Service: [name], [name] 12/15/2019 10:15 AM Medical Record [id number] Patient Account Number: [id number] Date of Birth/Sex: Treating RN: [birth year] (69 y.o. [name] Primary Care Provider: [name] Other Clinician: Referring Provider: Treating Provider/Extender:[name], Dema Severin, [name] in Treatment: 203 Information Obtained from: Patient Chief Complaint patient is here for evaluation venous/lymphedema weeping Electronic Signature(s) Signed: 12/15/2019 10:50:52 AM By: Worthy Keeler PA-C Entered By: Worthy Keeler on 12/15/2019 10:50:51 -------------------------------------------------------------------------------- HPI Details Patient Name: Date of Service: ASANTE, BLANDA 12/15/2019 10:15 AM Medical Record [id number] Patient Account Number: [id number] Date of Birth/Sex: Treating RN: 06-Jan-1951 (69 y.o. [name] Primary Care Provider: [name] Other Clinician: Referring Provider: Treating Provider/Extender:[name], Dema Severin, [name] in Treatment: 203 History of Present Illness HPI Description: Referred by PCP for consultation. Patient has long standing history of BLE venous stasis, no prior ulcerations. At beginning of month, developed cellulitis and weeping. Received IM Rocephin followed by Keflex and resolved. Wears compression stocking, appr 6 months old. Not sure strength. No present drainage. 01/22/16 this is a patient who is a type II diabetic on insulin. 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 He tells me he religiously wears pressure stockings of uncertain strength. He was here with weeping edema about 8 months ago but did not have an open wound. Roughly a month ago he had a reopening on his bilateral legs. He is been using bandages and Neosporin. He does not  complain of pain. 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 Last BUN/creatinine I have is from 11/20/15 at 13 and 1.0 respectively 01/29/16; patient arrives today having tolerated the Profore wrap. He brought in his stockings and these are 18 mmHg stockings he bought from [place]. The compression here is likely inadequate. He does not complain of pain or excessive drainage she has no systemic symptoms. The wound on the right looks improved as does the one on the left although one on the left is more substantial with still tissue at risk below the actual wound area on the bilateral posterior calf 02/05/16; patient arrives with poor edema control. He states that we did put a 4 layer compression on it last week. No weight appear 5 this. 02/12/16; the area on the posterior right Has healed. The left Has a substantial wound that has necrotic surface eschar that requires a debridement with a curette. 02/16/16;the patient called or a Nurse visit secondary to increased swelling. He had been in earlier in the week with his right leg healed. He was transitioned to is on pressure stocking on the right leg with the only open wound on the left, a substantial area on the left posterior calf. Note he has a history of severe lower extremity edema, he has a history of chronic atrial fibrillation but not heart failure per my notes but I'll need to research this. He is not complaining of chest pain shortness of breath or orthopnea. The intake nurse noted blisters on the previously closed right leg 02/19/16; this is the patient's regular visit day. I see him on Friday with escalating edema new wounds on the right leg and clear signs of at least right ventricular heart failure. I increased his Lasix to 40 twice a day. He is returning currently in follow-up. States he  is noticed a decrease in that the edema 02/26/16  patient's legs have much less edema. There is nothing really open on the right leg. The left leg has improved condition of the large superficial wound on the posterior left leg 03/04/16; edema control is very much better. The patient's right leg wounds have healed. On the left leg he continues to have severe venous inflammation on the posterior aspect of the left leg. There is no tenderness and I don't think any of this is cellulitis. 03/11/16; patient's right leg is married healed and he is in his own stocking. The patient's left leg has deteriorated somewhat. There is a lot of erythema around the wound on the posterior left leg. There is also a significant rim of erythema posteriorly just above where the wrap would've ended there is a new wound in this location and a lot of tenderness. Can't rule out cellulitis in this area. 03/15/16; patient's right leg remains healed and he is in his own stocking. The patient's left leg is much better than last review. His major wound on the posterior aspect of his left Is almost fully epithelialized. He has 3 small injuries from the wraps. Really. Erythema seems a lot better on antibiotics 03/18/16; right leg remains healed and he is in his own stocking. The patient's left leg is much better. The area on the posterior aspect of the left calf is fully epithelialized. His 3 small injuries which were wrap injuries on the left are improved only one seems still open his erythema has resolved 03/25/16; patient's right leg remains healed and he is in his own stocking. There is no open area today on the left leg posterior leg is completely closed up. His wrap injuries at the superior aspect of his leg are also resolved. He looks as though he has some irritation on the dorsal ankle but this is fully epithelialized without evidence of infection. 03/28/16; we discharged this patient on Monday. Transitioned him into his own stocking. There were problems almost immediately with  uncontrolled swelling weeping edema multiple some of which have opened. He does not feel systemically unwell in particular no chest pain no shortness of breath and he does not feel 04/08/16; the edema is under better control with the Profore light wrap but he still has pitting edema. There is one large wound anteriorly 2 on the medial aspect of his left leg and 3 small areas on the superior posterior calf. Drainage is not excessive he is tolerating a Profore light well 04/15/16; put a Profore wrap on him last week. This is controlled is edema however he had a lot of pain on his left anterior foot most of his wounds are healed 04/22/16 once again the patient has denuded areas on the left anterior foot which he states are because his wrap slips up word. He saw his primary physician today is on Lasix 40 twice a day and states that he his weight is down 20 pounds over the last 3 months. 04/29/16: Much improved. left anterior foot much improved. He is now on Lasix 80 mg per day. Much improved edema control 05/06/16; I was hoping to be able to discharge him today however once again he has blisters at a low level of where the compression was placed last week mostly on his left lateral but also his left medial leg and a small area on the anterior part of the left foot. 05/09/16; apparently the patient went home after his appointment on 7/4 later in  the evening developing pain in his upper medial thigh together with subjective fever and chills although his temperature was not taken. The pain was so intense he felt he would probably have to call 911. However he then remembered that he had leftover doxycycline from a previous round of antibiotics and took these. By the next morning he felt a lot better. He called and spoke to one of our nurses and I approved doxycycline over the phone thinking that this was in relation to the wounds we had previously seen although they were definitely were not. The patient feels a  lot better old fever no chills he is still working. Blood sugars are reasonably controlled 05/13/16; patient is back in for review of his cellulitis on his anterior medial upper thigh. He is taking doxycycline this is a lot better. Culture I did of the nodular area on the dorsal aspect of his foot grew MRSA this also looks a lot better. 05/20/16; the patient is cellulitis on the medial upper thigh has resolved. All of his wound areas including the left anterior foot, areas on the medial aspect of the left calf and the lateral aspect of the calf at all resolved. He has a new blister on the left dorsal foot at the level of the fourth toe this was excised. No evidence of infection 05/27/16; patient continues to complain weeping edema. He has new blisterlike wounds on the left anterior lateral and posterior lateral calf at the top of his wrap levels. The area on his left anterior foot appears better. He is not complaining of fever, pain or pruritus in his feet. 05/30/16; the patient's blisters on his left anterior leg posterior calf all look improved. He did not increase the Lasix 100 mg as I suggested because he was going to run out of his 40 mg tablets. He is still having weeping edema of his toes 06/03/16; I renewed his Lasix at 80 mg once a day as he was about to run out when I last saw him. He is on 80 mg of Lasix now. I have asked him to cut down on the excessive amount of water he was drinking and asked him to drink according to his thirst mechanisms 06/12/2016 -- was seen 2 days ago and was supposed to wear his compression stockings at home but he is developed lymphedema and superficial blisters on the left lower extremity and hence came in for a review 06/24/16; the remaining wound is on his left anterior leg. He still has edema coming from between his toes. There is lymphedema here however his edema is generally better than when I last saw this. He has a history of atrial fibrillation but does  not have a known history of congestive heart failure nevertheless I think he probably has this at least on a diastolic basis. 07/01/16 I reviewed his echocardiogram from January 2017. This was essentially normal. He did not have LVH, EF of 55-60%. His right ventricular function was normal although he did have trivial tricuspid and pulmonic regurgitation. This is not audible on exam however. I increased his Lasix to do massive edema in his legs well above his knees I think in early July. He was also drinking an excessive amount of water at the time. 07/15/16; missed his appointment last week because of the Labor Day holiday on Monday. He could not get another appointment later in the week. Started to feel the wrap digging in superiorly so we remove the top half and the bottom  half of his wrap. He has extensive erythema and blistering superiorly in the left leg. Very tender. Very swollen. Edema in his foot with leaking edema fluid. He has not been systemically unwell 07/22/16; the area on the left leg laterally required some debridement. The medial wounds look more stable. His wrap injury wounds appear to have healed. Edema and his foot is better, weeping edema is also better. He tells me he is meeting with the supplier of the external compression pumps at work 08/05/16; the patient was on vacation last week in [facility]. His wrap is been on for an extended period of time. Also over the weekend he developed an extensive area of tender erythema across his anterior medial thigh. He took to doxycycline yesterday that he had leftover from a previous prescription. The patient complains of weeping edema coming out of his toes 08/08/16; I saw this patient on 10/2. He was tender across his anterior thigh. I put him on doxycycline. He returns today in follow-up. He does not have any open wounds on his lower leg, he still has edema weeping into his toes. 08/12/16; patient was seen back urgently today to  follow-up for his extensive left thigh cellulitis/erysipelas. He comes back with a lot less swelling and erythema pain is much better. I believe I gave him Augmentin and Cipro. His wrap was cut down as he stated a roll down his legs. He developed blistering above the level of the wrap that remained. He has 2 open blisters and 1 intact. 08/19/16; patient is been doing his primary doctor who is increased his Lasix from 40-80 once a day or 80 already has less edema. Cellulitis has remained improved in the left thigh. 2 open areas on the posterior left calf 08/26/16; he returns today having new open blisters on the anterior part of his left leg. He has his compression pumps but is not yet been shown how to use some vital representative from the supplier. 09/02/16 patient returns today with no open wounds on the left leg. Some maceration in his plantar toes 09/10/2016 -- Dr. Leanord Hawking had recently discharged him on 09/02/2016 and he has come right back with redness swelling and some open ulcers on his left lower extremity. He says this was caused by trying to apply his compression stockings and he's been unable to use this and has not been able to use his lymphedema pumps. He had some doxycycline leftover and he has started on this a few days ago. 09/16/16; there are no open wounds on his leg on the left and no evidence of cellulitis. 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 He does not complain of symptoms here. I am not clear use using his external compression pumps. 09/23/16; I have not seen this patient in 2 weeks. He canceled his appointment 10 days ago as he was going on vacation. He tells me that on Monday he noticed a large area on his posterior left leg which is been draining copiously and is reopened into a large wound. He is been using ABDs and the external part of his juxtalite, according to our nurse this was not on  properly. 10/07/16; Still a substantial area on the posterior left leg. Using silver alginate 10/14/16; in general better although there is still open area which looks healthy. Still using silver alginate. He reminds me that this happen before he left for [facility]. Today while he was showering in the morning.  He had been using his juxtalite's 10/21/16; the area on his posterior left leg is fully epithelialized. However he arrives today with a large area of tender erythema in his medial and posterior left thigh just above the knee. I have marked the area. Once again he is reluctant to consider hospitalization. I treated him with oral antibiotics in the past for a similar situation with resolution I think with doxycycline however this area it seems more extensive to me. He is not complaining of fever but does have chills and says states he is thirsty. His blood sugar today was in the 140s at home 10/25/16 the area on his posterior left leg is fully epithelialized although there is still some weeping edema. The large area of tenderness and erythema in his medial and posterior left thigh is a lot less tender although there is still a lot of swelling in this thigh. He states he feels a lot better. He is on doxycycline and Augmentin that I started last week. This will continued until Tuesday, December 26. I have ordered a duplex ultrasound of the left thigh rule out DVT whether there is an abscess something that would need to be drained I would also like to know. 11/01/16; he still has weeping edema from a not fully epithelialized area on his left posterior calf. Most of the rest of this looks a lot better. He has completed his antibiotics. His thigh is a lot better. Duplex ultrasound did not show a DVT in the thigh 11/08/16; he comes in today with more Denuded surface epithelium from the posterior aspect of his calf. There is no real evidence of cellulitis. The superior aspect of his wrap appears to  have put quite an indentation in his leg just below the knee and this may have contributed. He does not complain of pain or fever. We have been using silver alginate as the primary dressing. The area of cellulitis in the right thigh has totally resolved. He has been using his compression stockings once a week 11/15/16; the patient arrives today with more loss of epithelium from the posterior aspect of his left calf. He now has a fairly substantial wound in this area. The reason behind this deterioration isn't exactly clear although his edema is not well controlled. He states he feels he is generally more swollen systemically. He is not complaining of chest pain shortness of breath fever. Tells me he has an appointment with his primary physician in early February. He is on 80 mg of oral Lasix a day. He claims compliance with the external compression pumps. He is not having any pain in his legs similar to what he has with his recurrent cellulitis 11/22/16; the patient arrives a follow-up of his large area on his left lateral calf. This looks somewhat better today. He came in earlier in the week for a dressing change since I saw him a week ago. He is not complaining of any pain no shortness of breath no chest pain 11/28/16; the patient arrives for follow-up of his large area on the left lateral calf this does not look better. In fact it is larger weeping edema. The surface of the wound does not look too bad. We have been using silver alginate although I'm not certain that this is a dressing issue. 12/05/16; again the patient follows up for a large wound on the left lateral and left posterior calf this does not look better. There continues to be weeping edema necrotic surface tissue. More worrisome than  this once again there is erythema below the wound involving the distal Achilles and heel suggestive of cellulitis. He is on his feet working most of the day of this is not going well. We are changing his  dressing twice a week to facilitate the drainage. 12/12/16; not much change in the overall dimensions of the large area on the left posterior calf. This is very inflamed looking. I gave him an. Doxycycline last week does not really seem to have helped. He found the wrap very painful indeed it seems to of dog into his legs superiorly and perhaps around the heel. He came in early today because the drainage had soaked through his dressings. 12/19/16- patient arrives for follow-up evaluation of his left lower extremity ulcers. He states that he is using his lymphedema pumps once daily when there is "no drainage". He admits to not using his lipedema pumps while under current treatment. His blood sugars have been consistently between 150-200. 12/26/16; the patient is not using his compression pumps at home because of the wetness on his feet. I've advised him that I think it's important for him to use this daily. He finds his feet too wet, he can put a plastic bag over his legs while he is in the pumps. Otherwise I think will be in a vicious circle. We are using silver alginate to the major area on his left posterior calf 01/02/17; the patient's posterior left leg has further of all into 3 open wounds. All of them covered with a necrotic surface. He claims to be using his compression pumps once a day. His edema control is marginal. Continue with silver alginate 01/10/17; the patient's left posterior leg actually looks somewhat better. There is less edema, less erythema. Still has 3 open areas covered with a necrotic surface requiring debridement. He claims to be using his compression pumps once a day his edema control is better 01/17/17; the patient's left posterior calf look better last week when I saw him and his wrap was changed 2 days ago. He has noted increasing pain in the left heel and arrives today with much larger wounds extensive erythema extending down into the entire heel area especially tender  medially. He is not systemically unwell CBGs have been controlled no fever. Our intake nurse showed me limegreen drainage on his AVD pads. 01/24/17; his usual this patient responds nicely to antibiotics last week giving him Levaquin for presumed Pseudomonas. The whole entire posterior part of his leg is much better much less inflamed and in the case of his Achilles heel area much less tender. He has also had some epithelialization posteriorly there are still open areas here and still draining but overall considerably better 01/31/17- He has continue to tolerate the compression wraps. he states that he continues to use the lymphedema pumps daily, and can increase to twice daily on the weekends. He is voicing no complaints or concerns regarding his LLE ulcers 02/07/17-he is here for follow-up evaluation. He states that he noted some erythema to the left medial and anterior thigh, which he states is new as of yesterday. He is concerned about recurrent cellulitis. He states his blood sugars have been slightly elevated, this morning in the 180s 02/14/17; he is here for follow-up evaluation. When he was last here there was erythema superiorly from his posterior wound in his anterior thigh. He was prescribed Levaquin however a culture of the wound surface grew MRSA over the phone I changed him to doxycycline on Monday and things  seem to be a lot better. 02/24/17; patient missed his appointment on Friday therefore we changed his nurse visit into a physician visit today. Still using silver alginate on the large area of the posterior left thigh. He isn't new area on the dorsal left second toe 03/03/17; actually better today although he admits he has not used his external compression pumps in the last 2 days or so because of work responsibilities over the weekend. 03/10/17; continued improvement. External compression pumps once a day almost all of his wounds have closed on the posterior left calf. Better edema  control 03/17/17; in general improved. He still has 3 small open areas on the lateral aspect of his left leg however most of the area on the posterior part of his leg is epithelialized. He has better edema control. He has an ABD pad under his stocking on the right anterior lower leg although he did not let us look at that today. 03/24/17; patient arrives back in clinic today with no open areas however there are areas on the posterior left calf and anterior left calf that are less than 100% epithelialized. His edema is well controlled in the left lower leg. There is some pitting edema probably lymphedema in the left upper thigh. He uses compression pumps at home once per day. I tried to get him to do this twice a day although he is very reticent. 04/01/2017 -- for the last 2 days he's had significant redness, tenderness and weeping and came in for an urgent visit today. 04/07/17; patient still has 6 more days of doxycycline. He was seen by Dr. Con Memos last Wednesday for cellulitis involving the posterior aspect, lateral aspect of his Involving his heel. For the most part he is better there is less erythema and less weeping. He has been on his feet for 12 hours 2 over the weekend. Using his compression pumps once a day 04/14/17 arrives today with continued improvement. Only one area on the posterior left calf that is not fully epithelialized. He has intense bilateral venous inflammation associated with his chronic venous insufficiency disease and secondary lymphedema. We have been using silver alginate to the left posterior calf wound In passing he tells [place] today that the right leg but we have not seen in quite some time has an open area on it but he doesn't want [place] to look at this today states he will show this to [place] next week. 04/21/17; there is no open area on his left leg although he still reports some weeping edema. He showed [place] his right leg today which is the first time we've seen this leg in a  long time. He has a large area of open wound on the right leg anteriorly healthy granulation. Quite a bit of swelling in the right leg and some degree of venous inflammation. He told us about the right leg in passing last week but states that deterioration in the right leg really only happened over the weekend 04/28/17; there is no open area on the left leg although there is an irritated part on the posterior which is like a wrap injury. The wound on the right leg which was new from last week at least to [place] is a lot better. 05/05/17; still no open area on the left leg. Patient is using his new compression stocking which seems to be doing a good job of controlling the edema. He states he is using his compression pumps once per day. The right leg still has an  open wound although it is better in terms of surface area. Required debridement. A lot of pain in the posterior right Achilles marked tenderness. Usually this type of presentation this patient gives concern for an active cellulitis 05/12/17; patient arrives today with his major wound from last week on the right lateral leg somewhat better. Still requiring debridement. He was using his compression stocking on the left leg however that is reopened with superficial wounds anteriorly he did not have an open wound on this leg previously. He is still using his juxta light's once daily at night. He cannot find the time to do this in the morning as he has to be at work by 7 AM 05/19/17; right lateral leg wound looks improved. No debridement required. The concerning area is on the left posterior leg which appears to almost have a subcutaneous hemorrhagic component to it. We've been using silver alginate to all the wounds 05/26/17; the right lateral leg wound continues to look improved. However the area on the left posterior calf is a tightly adherent surface. [place] using silver alginate. Because of the weeping edema in his legs there is very little  good alternatives. 06/02/17; the patient left here last week looking quite good. Major wound on the left posterior calf and a small one on the right lateral calf. Both of these look satisfactory. He tells me that by Wednesday he had noted increased pain in the left leg and drainage. He called on Thursday and Friday to get an appointment here but we were blocked. He did not go to urgent care or his primary physician. He thinks he had a fever on Thursday but did not actually take his temperature. He has not been using his compression pumps on the left leg because of pain. I advised him to go to the emergency room today for IV antibiotics for stents of left leg cellulitis but he has refused I have asked him to take 2 days off work to keep his leg elevated and he has refused this as well. In view of this I'm going to call him and Augmentin and doxycycline. 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 However after a detailed discussion regarding the risk benefits and alternatives he refuses to get admitted to the hospital. With no other recourse I will continue him on oral antibiotics as before and hopefully he'll have his infectious disease consultation this week. 06/16/2017 -- the patient was seen today by the nurse practitioner at infectious disease Ms. Dixon. 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 He has also been advise Lotrimin cream locally. she also had wise IV antibiotics for his condition if it flares up 06/23/17; patient arrives today with drainage bilaterally although the  remaining wound on the left posterior calf after cleaning up today "highlighter yellow drainage" did not look too bad. Unfortunately he has had breakdown on the right anterior leg [previously this leg had not been open and he is using a black stocking] he went to see infectious disease and is been put on clindamycin 150 mg daily, I  did not verify the dose although I'm not familiar with using clindamycin in this dosing range, perhaps for prophylaxisoo 06/27/17; I brought this patient back today to follow-up on the wound deterioration on the right lower leg together with surrounding cellulitis. I started him on doxycycline 4 days ago. This area looks better however he comes in today with intense cellulitis on the medial part of his left thigh. This is not have a wound in this area. Extremely tender. We've been using silver alginate to the wounds on the right lower leg left lower leg with bilateral 4 layer compression he is using his external compression pumps once a day 07/04/17; patient's left medial thigh cellulitis looks better. He has not been using his compression pumps as his insert said it was contraindicated with cellulitis. His right leg continues to make improvements all the wounds are still open. We only have one remaining wound on the left posterior calf. Using silver alginate to all open areas. He is on doxycycline which I started a week ago and should be finishing I gave him Augmentin after Thursday's visit for the severe cellulitis on the left medial thigh which fortunately looks better 07/14/17; the patient's left medial thigh cellulitis has resolved. The cellulitis in his right lower calf on the right also looks better. All of his wounds are stable to improved we've been using silver alginate he has completed the antibiotics I have given him. He has clindamycin 150 mg once a day prescribed by infectious disease for prophylaxis, I've advised him to start this now. We have been using  bilateral Unna boots over silver alginate to the wound areas 07/21/17; the patient is been to see infectious disease who noted his recurrent problems with cellulitis. He was not able to tolerate prophylactic clindamycin therefore he is on amoxicillin 500 twice a day. He also had a second daily dose of Lasix added By Dr. Oneta RackMcKeown but he is not taking this. Nor is he being completely compliant with his compression pumps a especially not this week. He has 2 remaining wounds one on the right posterior lateral lower leg and one on the left posterior medial lower leg. 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 The patient has Unna boots bilaterally. 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 He has bilateral Unna boots for compression and silver alginate to his wounds. Arrives today with his legs looking as good as I have seen him in quite some time. Not surprisingly his wounds look better as well with improvement on the right lateral leg venous insufficiency wound and also the left medial leg. He is still using the compression pumps once a day 08/11/17; both legs appear to be doing better wounds on the right lateral and left medial legs look better. Skin on the right leg quite good. He is been using silver alginate as the primary dressing. I'm going to use Anasept gel calcium alginate and maintain all the secondary dressings 08/18/17; the patient continues to actually do quite well. The area on his right lateral leg is just about closed the left medial also looks better although it is still moist in this area. His edema is well controlled we have been using Anasept gel with calcium alginate and the usual secondary dressings, 4 layer compression and once daily  use of his compression  pumps "always been able to manage 09/01/17; the patient continues to do reasonably well in spite of his trip to [place]. The area on the right lateral leg is epithelialized. Left is much better but still open. He has more edema and more chronic erythema on the left leg [venous inflammation] 09/08/17; he arrives today with no open wound on the right lateral leg and decently controlled edema. Unfortunately his left leg is not nearly as in his good situation as last week.he apparently had increasing edema starting on Saturday. He edema soaked through into his foot so used a plastic bag to walk around his home. The area on the medial right leg which was his open area is about the same however he has lost surface epithelium on the left lateral which is new and he has significant pain in the Achilles area of the left foot. He is already on amoxicillin chronically for prophylaxis of cellulitis in the left leg 09/15/17; he is completed a week of doxycycline and the cellulitis in the left posterior leg and Achilles area is as usual improved. He still has a lot of edema and fluid soaking through his dressings. There is no open wound on the right leg. He saw infectious disease NP today 09/22/17;As usual 1 we transition him from our compression wraps to his stockings things did not go well. He has several small open areas on the right leg. He states this was caused by the compression wrap on his skin although he did not wear this with the stockings over them. He has several superficial areas on the left leg medially laterally posteriorly. He does not have any evidence of active cellulitis especially involving the left Achilles The patient is traveling from [facility] Saturday going to [facility]. He states he isn't attempting to get an appointment with a heel objects wound center there to change his dressings. I am not completely certain whether this will work 10/06/17; the patient came in on Friday for a  nurse visit and the nurse reported that his legs actually look quite good. He arrives in clinic today for his regular follow-up visit. He has a new wound on his left third toe over the PIP probably caused by friction with his footwear. He has small areas on the left leg and a very superficial but epithelialized area on the right anterior lateral lower leg. Other than that his legs look as good as I've seen him in quite some time. We have been using silver alginate Review of systems; no chest pain no shortness of breath other than this a 10 point review of systems negative 10/20/17; seen by Dr. Meyer Russel last week. He had taken some antibiotics [doxycycline] that he had left over. Dr. Meyer Russel thought he had candida infection and declined to give him further antibiotics. He has a small wound remaining on the right lateral leg several areas on the left leg including a larger area on the left posterior several left medial and anterior and a small wound on the left lateral. The area on the left dorsal third toe looks a lot better. ROS; Gen.; no fever, respiratory no cough no sputum Cardiac no chest pain other than this 10 point review of system is negative 10/30/17; patient arrives today having fallen in the bathtub 3 days ago. It took him a while to get up. He has pain and maceration in the wounds on his left leg which have deteriorated. He has not been using his pumps  he also has some maceration on the right lateral leg. 11/03/17; patient continues to have weeping edema especially in the left leg. This saturates his dressings which were just put on on 12/27. As usual the doxycycline seems to take care of the cellulitis on his lower leg. He is not complaining of fever, chills, or other systemic symptoms. He states his leg feels a lot better on the doxycycline I gave him empirically. He also apparently gets injections at his primary doctor's officeo Rocephin for cellulitis prophylaxis. I didn't ask him  about his compression pump compliance today I think that's probably marginal. Arrives in the clinic with all of his dressings primary and secondary macerated full of fluid and he has bilateral edema 11/10/17; the patient's right leg looks some better although there is still a cluster of wounds on the right lateral. The left leg is inflamed with almost circumferential skin loss medially to laterally although we are still maintaining anteriorly. He does not have overt cellulitis there is a lot of drainage. He is not using compression pumps. We have been using silver alginate to the wound areas, there are not a lot of options here 11/17/17; the patient's right leg continues to be stable although there is still open wounds, better than last week. The inflammation in the left leg is better. Still loss of surface layer epithelium especially posteriorly. There is no overt cellulitis in the amount of edema and his left leg is really quite good, tells me he is using his compression pumps once a day. 11/24/17; patient's right leg has a small superficial wound laterally this continues to improve. The inflammation in the left leg is still improving however we have continuous surface layer epithelial loss posteriorly. There is no overt cellulitis in the amount of edema in both legs is really quite good. He states he is using his compression pumps on the left leg once a day for 5 out of 7 days 12/01/17; very small superficial areas on the right lateral leg continue to improve. Edema control in both legs is better today. He has continued loss of surface epithelialization and left posterior calf although I think this is better. We have been using silver alginate with large number of absorptive secondary dressings 4 layer on the left Unna boot on the right at his request. He tells me he is using his compression pumps once a day 12/08/17; he has no open area on the right leg is edema control is good here. On the left leg  however he has marked erythema and tenderness breakdown of skin. He has what appears to be a wrap injury just distal to the popliteal fossa. 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 There was swelling erythema and breakdown in the right leg. He has much worse skin breakdown in the left leg as well multiple open areas medially and posteriorly as well as laterally. He tells me he has been using his compression pumps but tells me he feels that the drainage out of his leg is worse when he uses a compression pumps. To be fair to him he is been saying this for a while however I don't know that I have really been listening to this. I wonder if  the compression pumps are working properly 12/22/17;. Once again he arrives with severe erythema, weeping edema from the left greater than right leg. Noncompliance with compression pumps. New this visit he is complaining of pain on the lateral aspect of the right leg and the medial aspect of his right thigh. He apparently saw his cardiologist Dr. Debara Pickett who was ordered an echocardiogram area and I think this is a step in the right direction 12/25/17; started his doxycycline Monday night. There is still intense erythema of the right leg especially in the anterior thigh although there is less tenderness. The erythema around the wound on the right lateral calf also is less tender. He still complaining of pain in the left heel. His wounds are about the same right lateral left medial left lateral. Superficial but certainly not close to closure. He denies being systemically unwell no fever chills no abdominal pain no diarrhea 12/29/17; back in follow-up of his extensive right calf and right thigh  cellulitis. I added amoxicillin to cover possible doxycycline resistant strep. This seems to of done the trick he is in much less pain there is much less erythema and swelling. He has his echocardiogram at 11:00 this morning. X-ray of the left heel was also negative. 01/05/18; the patient arrived with his edema under much better control. Now that he is retired he is able to use his compression pumps daily and sometimes twice a day per the patient. He has a wound on the right leg the lateral wound looks better. Area on the left leg also looks a lot better. He has no evidence of cellulitis in his bilateral thighs I had a quick peak at his echocardiogram. He is in normal ejection fraction and normal left ventricular function. He has moderate pulmonary hypertension moderately reduced right ventricular function. One would have to wonder about chronic sleep apnea although he says he doesn't snore. He'll review the echocardiogram with his cardiologist. 01/12/18; the patient arrives with the edema in both legs under exemplary control. He is using his compression pumps daily and sometimes twice daily. His wound on the right lateral leg is just about closed. He still has some weeping areas on the posterior left calf and lateral left calf although everything is just about closed here as well. I have spoken with Jeri Lager who is the patient's nurse practitioner and infectious disease. She was concerned that the patient had not understood that the parenteral penicillin injections he was receiving for cellulitis prophylaxis was actually benefiting him. I don't think the patient actually saw that I would tend to agree we were certainly dealing with less infections although he had a serious one last month. 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 He is healed. He'll be placed in juxtalite compression wraps and increase his lymphedema pumps to twice daily. We will follow up  again next week to ensure there are no issues with the new regiment. 01/20/18-he is here for evaluation of bilateral lower extremity weeping edema. Yesterday he was placed in compression wrap to the right lower extremity and compression stocking to left lower shrubbery. He states he uses lymphedema pumps last night and again this morning and noted a blister to the left lower extremity. On exam he was noted to have drainage to the right lower extremity. He will be placed in Unna boots bilaterally and follow-up next week 01/26/18; patient was actually discharged a week ago to his own juxta light stockings only to return the next  day with bilateral lower extremity weeping edema.he was placed in bilateral Unna boots. He arrives today with pain in the back of his left leg. There is no open area on the right leg however there is a linear/wrap injury on the left leg and weeping edema on the left leg posteriorly. I spoke with infectious disease about 10 days ago. They were disappointed that the patient elected to discontinue prophylactic intramuscular penicillin shots as they felt it was particularly beneficial in reducing the frequency of his cellulitis. I discussed this with the patient today. He does not share this view. He'll definitely need antibiotics today. Finally he is traveling to [place] and trauma leaving this Saturday and returning a week later and he does not travel with his pumps. He is going by car 01/30/18; patient was seen 4 days ago and brought back in today for review of cellulitis in the left leg posteriorly. I put him on amoxicillin this really hasn't helped as much as I might like. He is also worried because he is traveling to [facility] trauma by car. Finally we will be rewrapping him. There is no open area on the right leg over his left leg has multiple weeping areas as usual 02/09/18; The same wrap on for 10 days. He did not pick up the last doxycycline I prescribed for him. He  apparently took 4 days worth he already had. There is nothing open on his right leg and the edema control is really quite good. He's had damage in the left leg medially and laterally especially probably related to the prolonged use of Unna boots 02/12/18; the patient arrived in clinic today for a nurse visit/wrap change. He complained of a lot of pain in the left posterior calf. He is taking doxycycline that I previously prescribed for him. Unfortunately even though he used his stockings and apparently used to compression pumps twice a day he has weeping edema coming out of the lateral part of his right leg. This is coming from the lower anterior lateral skin area. 02/16/18; the patient has finished his doxycycline and will finish the amoxicillin 2 days. The area of cellulitis in the left calf posteriorly has resolved. He is no longer having any pain. He tells me he is using his compression pumps at least once a day sometimes twice. 02/23/18; the patient finished his doxycycline and Amoxil last week. On Friday he noticed a small erythematous circle about the size of a quarter on the left lower leg just above his ankle. This rapidly expanded and he now has erythema on the lateral and posterior part of the thigh. This is bright red. Also has an area on the dorsal foot just above his toes and a tender area just below the left popliteal fossa. He came off his prophylactic penicillin injections at his own insistence one or 2 months ago. This is obviously deteriorated since then 03/02/18; patient is on doxycycline and Amoxil. Culture I did last week of the weeping area on the back of his left calf grew group B strep. I have therefore renewed the amoxicillin 500 3 times a day for a further week. He has not been systemically unwell. Still complaining of an area of discomfort right under his left popliteal fossa. There is no open wound on the right leg. He tells me that he is using his pumps twice a day on  most days 03/09/18; patient arrives in clinic today completing his amoxicillin today. The cellulitis on his left leg is better. Furthermore  he tells me that he had intramuscular penicillin shots that his primary care office today. However he also states that the wrap on his right leg fell down shortly after leaving clinic last week. He developed a large blister that was present when he came in for a nurse visit later in the week and then he developed intense discomfort around this area.He tells me he is using his compression pumps 03/16/18; the patient has completed his doxycycline. The infectious part of this/cellulitis in the left heel area left popliteal area is a lot better. He has 2 open areas on the right calf. Still areas on the left calf but this is a lot better as well. 03/24/18; the patient arrives complaining of pain in the left popliteal area again. He thinks some of this is wrap injury. He has no open area on the right leg and really no open area on the left calf either except for the popliteal area. He claims to be compliant with the compression pumps 03/31/18; I gave him doxycycline last week because of cellulitis in the left popliteal area. This is a lot better although the surface epithelium is denuded off and response to this. He arrives today with uncontrolled edema in the right calf area as well as a fingernail injury in the right lateral calf. There is only a few open areas on the left 04/06/18; I gave him amoxicillin doxycycline over the last 2 weeks that the amoxicillin should be completing currently. He is not complaining of any pain or systemic symptoms. The only open areas see has is on the right lateral lower leg paradoxically I cannot see anything on the left lower leg. He tells me he is using his compression pumps twice a day on most days. Silver alginate to the wounds that are open under 4 layer compression 04/13/18; he completed antibiotics and has no new complaints. Using  his compression pumps. Silver alginate that anything that's opened 04/20/18; he is using his compression pumps religiously. Silver alginate 4 layer compression anything that's opened. He comes in today with no open wounds on the left leg but 3 on the right including a new one posteriorly. He has 2 on the right lateral and one on the right posterior. He likes Unna boots on the right leg for reasons that aren't really clear we had the usual 4 layer compression on the left. It may be necessary to move to the 4 layer compression on the right however for now I left them in the Unna boots 04/27/18; he is using his compression pumps at least once a day. He has still the wounds on the right lateral calf. The area right posteriorly has closed. He does not have an open wound on the left under 4 layer compression however on the dorsal left foot just proximal to the toes and the left third toe 2 small open areas were identified 05/11/18; he has not uses compression pumps. The areas on the right lateral calf have coalesced into one large wound necrotic surface. On the left side he has one small wound anteriorly however the edema is now weeping out of a large part of his left leg. He says he wasn't using his pumps because of the weeping fluid. I explained to him that this is the time he needs to pump more 05/18/18; patient states he is using his compression pumps twice a day. The area on the right lateral large wound albeit superficial. On the left side he has innumerable number of  small new wounds on the left calf particularly laterally but several anteriorly and medially. All these appear to have healthy granulated base these look like the remnants of blisters however they occurred under compression. The patient arrives in clinic today with his legs somewhat better. There is certainly less edema, less multiple open areas on the left calf and the right anterior leg looks somewhat better as well superficial and a  little smaller. However he relates pain and erythema over the last 3-4 days in the thigh and I looked at this today. He has not been systemically unwell no fever no chills no change in blood sugar values 05/25/18; comes in today in a better state. The severe cellulitis on his left leg seems better with the Keflex. Not as tender. He has not been systemically unwell Hard to find an open wound on the left lower leg using his compression pumps twice a day The confluent wounds on his right lateral calf somewhat better looking. These will ultimately need debridement I didn't do this today. 06/01/18; the severe cellulitis on the left anterior thigh has resolved and he is completed his Keflex. There is no open wound on the left leg however there is a superficial excoriation at the base of the third toe dorsally. Skin on the bottom of his left foot is macerated looking. The left the wounds on the lateral right leg actually looks some better although he did require debridement of the top half of this wound area with an open curet 06/09/18 on evaluation today patient appears to be doing poorly in regard to his right lower extremity in particular this appears to likely be infected he has very thick purulent discharge along with a bright green tent to the discharge. This makes me concerned about the possibility of pseudomonas. He's also having increased discomfort at this point on evaluation. Fortunately there does not appear to be any evidence of infection spreading to the other location at this time. 06/16/18 on evaluation today patient appears to actually be doing fairly well. His ulcer has actually diminished in size quite significantly at this point which is good news. Nonetheless he still does have some evidence of infection he did see infectious disease this morning before coming here for his appointment. I did review the results of their evaluation and their note today. They did actually have him  discontinue the Cipro and initiate treatment with linezolid at this time. He is doing this for the next seven days and they recommended a follow-up in four months with them. He is the keep a log of the need for intermittent antibiotic therapy between now and when he falls back up with infectious disease. This will help them gaze what exactly they need to do to try and help them out. 06/23/18; the patient arrives today with no open wounds on the left leg and left third toe healed. He is been using his compression pumps twice a day. On the right lateral leg he still has a sizable wound but this is a lot better than last time I saw this. 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 Has been using silver alginate under 4 layer compression 06/30/18; the only open wound he has is on the right lateral leg and this looks healthy. No debridement is required. We have been using silver alginate. He does not have an open wound on the left leg. There  is apparently some drainage from the dorsal proximal third toe on the left although I see no open wound here. 07/03/18 on evaluation today patient was actually here just for a nurse visit rapid change. However when he was here on Wednesday for his rat change due to having been healed on the left and then developing blisters we initiated the wrap again knowing that he would be back today for us to reevaluate and see were at. Unfortunately he has developed some cellulitis into the proximal portion of his right lower extremity even into the region of his thigh. 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 He was placed on one as what at that point. Nonetheless he is done with that and has been tolerating it well otherwise. Doxycycline which in the past really did not seem to be effective for him. Nonetheless I think the best  option may be for us to definitely reinitiate the antibiotics for a longer period of time. 07/07/18; since I last saw this patient a week ago he has had a difficult time. At that point he did not have an open wound on his left leg. We transitioned him into juxta light stockings. He was apparently in the clinic the next day with blisters on the left lateral and left medial lower calf. He also had weeping edema fluid. He was put back into a compression wrap. He was also in the clinic on Friday with intense erythema in his right thigh. Per the patient he was started on Bactrim however that didn't work at all in terms of relieving his pain and swelling. He has taken 3 doxycycline that he had left over from last time and that seems to of helped. He has blistering on the right thigh as well. 07/14/18; the erythema on his right thigh has gotten better with doxycycline that he is finishing. 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 Presumably this cellulitis in the thigh was not related to that although I think this is a bit concerning going forward. He still has an area on the right lateral calf the blister on the right medial calf just below the knee that was discussed above. On the left 2 small open areas left medial and left lateral. Edema control is adequate. He is using his compression pumps twice a day 07/20/18; continued improvement in the condition of both legs especially the edema in his bilateral thighs. He tells me he is been losing weight through a combination of diet and exercise. He is using his compression pumps twice a day. So overall she made to the remaining wounds 07/27/2018; continued improvement in condition of both legs. His edema is well controlled. The area on the right lateral leg is just about closed he had one blisters show up on the medial left upper calf. We have him in 4 layer compression. He is going on  a 10-day trip to [place], [place] and [place]. He will be driving. He wants to wear Unna boots because of the lessening amount of constriction. He will not use compression pumps while he is away 08/05/18 on evaluation today patient actually appears to be doing decently well all things considered in regard to his bilateral lower extremities. The worst ulcer is actually only posterior aspect of his left lower extremity with a four layer compression wrap cut into his leg a couple weeks  back. He did have a trip and actually had [employer] for the trip that he is worn since he was last here. Nonetheless he feels like the [employer] actually do better for him his swelling is up a little bit but he also with his trip was not taking his Lasix on a regular set schedule like he was supposed to be. He states that obviously the reason being that he cannot drive and keep going without having to urinate too frequently which makes it difficult. He did not have his pumps with him while he was away either which I think also maybe playing a role here too. 08/13/2018; the patient only has a small open wound on the right lateral calf which is a big improvement in the last month or 2. He also has the area posteriorly just below the posterior fossa on the left which I think was a wrap injury from several weeks ago. He has no current evidence of cellulitis. He tells me he is back into his compression pumps twice a day. He also tells me that while he was at the [place] somebody stole a section of his extremitease stockings 08/20/2018; back in the clinic with a much improved state. He only has small areas on the right lateral mid calf which is just about healed. This was is more substantial area for quite a prolonged period of time. He has a small open area on the left anterior tibia. The area on the posterior calf just below the popliteal fossa is closed today. He is using his compression pumps twice a  day 08/28/2018; patient has no open wound on the right leg. He has a smattering of open areas on the calf with some weeping lymphedema. More problematically than that it looks as though his wraps of slipped down in his usual he has very angry upper area of edema just below the right medial knee and on the right lateral calf. He has no open area on his feet. The patient is traveling to [facility] next week. I will send him in an antibiotic. We will continue to wrap the right leg. We ordered extremitease stockings for him last week and I plan to transition the right leg to a stocking when he gets home which will be in 10 days time. As usual he is very reluctant to take his pumps with him when he travels 09/07/2018; patient returns from [facility]. He shows me a picture of his left leg in the mid part of his trip last week with intense fire engine erythema. The picture look bad enough I would have considered sending him to the hospital. Instead he went to the wound care center in [facility]. They did not prescribe him antibiotics but he did take some doxycycline he had leftover from a previous visit. I had given him trimethoprim sulfamethoxazole before he left this did not work according to the patient. This is resulted in some improvement fortunately. He comes back with a large wound on the left posterior calf. Smaller area on the left anterior tibia. Denuded blisters on the dorsal left foot over his toes. Does not have much in the way of wounds on the right leg although he does have a very tender area on the right posterior area just below the popliteal fossa also suggestive of infection. He promises me he is back on his pumps twice a day 09/15/2018; the intense cellulitis in his left lower calf is a lot better.  The wound area on the posterior left calf is also so better. However he has reasonably extensive wounds on the dorsal aspect of his second and third toes and the proximal  foot just at the base of the toes. There is nothing open on the right leg 09/22/2018; the patient has excellent edema control in his legs bilaterally. He is using his external compression pumps twice a day. He has no open area on the right leg and only the areas in the left foot dorsally second and third toe area on the left side. He does not have any signs of active cellulitis. 10/06/2018; the patient has good edema control bilaterally. He has no open wound on the right leg. There is a blister in the posterior aspect of his left calf that we had to deal with today. He is using his compression pumps twice a day. There is no signs of active cellulitis. We have been using silver alginate to the wound areas. He still has vulnerable areas on the base of his left first second toes dorsally He has a his extremities stockings and we are going to transition him today into the stocking on the right leg. He is cautioned that he will need to continue to use the compression pumps twice a day. If he notices uncontrolled edema in the right leg he may need to go to 3 times a day. 10/13/2018; the patient came in for a nurse check on Friday he has a large flaccid blister on the right medial calf just below the knee. We unroofed this. He has this and a new area underneath the posterior mid calf which was undoubtedly a blister as well. He also has several small areas on the right which is the area we put his extremities stocking on. 10/19/2018; the patient went to see infectious disease this morning I am not sure if that was a routine follow-up in any case the doxycycline I had given him was discontinued and started on linezolid. He has not started this. It is easy to look at his left calf and the inflammation and think this is cellulitis however he is very tender in the tissue just below the popliteal fossa and I have no doubt that there is infection going on here. He states the problem he is having is that with the  compression pumps the edema goes down and then starts walking the wrap falls down. We will see if we can adhere this. He has 1 or 2 minuscule open areas on the right still areas that are weeping on the posterior left calf, the base of his left second and third toes 10/26/18; back today in clinic with quite of skin breakdown in his left anterior leg. This may have been infection the area below the popliteal fossa seems a lot better however tremendous epithelial loss on the left anterior mid tibia area over quite inexpensive tissue. He has 2 blisters on the right side but no other open wound here. 10/29/2018; came in urgently to see [place] today and we worked him in for review. He states that the 4 layer compression on the right leg caused pain he had to cut it down to roughly his mid calf this caused swelling above the wrap and he has blisters and skin breakdown today. As a result of the pain he has not been using his pumps. Both legs are a lot more edematous and there is a lot of weeping fluid. 11/02/18; arrives in clinic with continued difficulties in  the right leg> left. Leg is swollen and painful. multiple skin blisters and new open areas especially laterally. He has not been using his pumps on the right leg. 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 He is not systemically unwell. 11/09/2018; the patient claims he is being compliant with his pumps. He is finished the doxycycline I gave him last week. Culture I did of the wound on the right lateral leg showed a few very resistant methicillin staph aureus. This was resistant to doxycycline. Nevertheless he states the pain in the leg is a lot better which makes me wonder if the cultured organism was not really what was causing the problem nevertheless this is a very dangerous organism to be culturing out of any wound. His right leg is still a lot larger than the left. He is using an [employer] boot on this area,  he blames a 4-layer compression for causing the original skin breakdown which I doubt is true however I cannot talk him out of it. We have been using silver alginate to all of these areas which were initially blisters 11/16/2018; patient is being compliant with his external compression pumps at twice a day. Miraculously he arrives in clinic today with absolutely no open wounds. He has better edema control on the left where he has been using 4 layer compression versus wound of wounds on the right and I pointed this out to him. There is no inflammation in the skin in his lower legs which is also somewhat unusual for him. There is no open wounds on the dorsal left foot. He has extremitease stockings at home and I have asked him to bring these in next week. 11/25/18 patient's lower extremity on examination today on the left appears for the most part to be wound free. He does have an open wound on the lateral aspect of the right lower extremity but this is minimal compared to what I've seen in past. He does request that we go ahead and wrap the left leg as well even though there's nothing open just so hopefully it will not reopen in short order. 1/28; patient has superficial open wounds on the right lateral calf left anterior calf and left posterior calf. His edema control is adequate. 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 We have been using silver alginate as the primary dressing. He claims compliance with his compression pumps 2/4; patient has superficial open wounds on numerous areas of his left calf and again one on the left dorsal foot. The areas on the right lateral calf have healed. The cellulitis that I gave him doxycycline for last week is also resolved this was mostly on the left anterior calf just below the tibial tuberosity. His edema looks fairly well-controlled. He tells me he went to see his primary  doctor today and had blood work ordered 2/11; once again he has several open areas on the left calf left tibial area. Most of these are small and appear to have healthy granulation. He does not have anything open on the right. The edema and control in his thighs is pretty good which is usually a good indication he has been using his pumps as requested. 2/18; he continues to have several small areas on the left calf and left tibial area. Most of these are small healthy granulation. We put him in his stocking on the right  leg last week and he arrives with a superficial open area over the right upper tibia and a fairly large area on the right lateral tibia in similar condition. His edema control actually does not look too bad, he claims to be using his compression pumps twice a day 2/25. Continued small areas on the left calf and left tibial area. New areas especially on the right are identified just below the tibial tuberosity and on the right upper tibia itself. There are also areas of weeping edema fluid even without an obvious wound. He does not have a considerable degree of lymphedema but clearly there is more edema here than his skin can handle. He states he is using the pumps twice a day. We have an Unna boot on the right and 4 layer compression on the left. 3/3; he continues to have an area on the right lateral calf and right posterior calf just below the popliteal fossa. There is a fair amount of tenderness around the wound on the popliteal fossa but I did not see any evidence of cellulitis, could just be that the wrap came down and rubbed in this area. He does not have an open area on the left leg however there is an area on the left dorsal foot at the base of the third toe We have been using silver alginate to all wound areas 3/10; he did not have an open area on his left leg last time he was here a week ago. Today he arrives with a horizontal wound just below the tibial tuberosity and an  area on the left lateral calf. He has intense erythema and tenderness in this area. The area is on the right lateral calf and right posterior calf better than last week. We have been using silver alginate as usual 3/18 - Patient returns with 3 small open areas on left calf, and 1 small open area on right calf, the skin looks ok with no significant erythema, he continues the UNA boot on right and 4 layer compression on left. The right lateral calf wound is closed , the right posterior is small area. we will continue silver alginate to the areas. 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 He is been tolerating the dressing changes without complication. Fortunately she has made excellent improvement in regard to the overall status of his wounds. Unfortunately every time we cease wrapping him he ends up reopening in causing more significant issues at that point. Again I'm unsure of the best direction to take although I think the lymphedema clinic may be appropriate for him. 02/03/19 on evaluation today patient appears to be doing well in regard to the wounds that we saw him for last week unfortunately he has a new area on the proximal portion of his right medial/posterior lower extremity where the wrap somewhat slowed down and caused swelling and a blister to rub and open. Unfortunately this is the only opening that he has on either leg at this point. 02/17/19 on evaluation today patient's bilateral lower extremities appear to be doing well. He still completely healed in regard to the left lower extremity. In regard to the right lower extremity the area where the wrap and slid down and caused the blister still seems to be slightly open although this is dramatically better than during the  last evaluation two  weeks ago. I'm very pleased with the way this stands overall. 03/03/19 on evaluation today patient appears to be doing well in regard to his right lower extremity in general although he did have a new blister open this does not appear to be showing any evidence of active infection at this time. Fortunately there's No fevers, chills, nausea, or vomiting noted at this time. Overall I feel like he is making good progress it does feel like that the right leg will we perform the [employer] seems to do with a bit better than three layer wrap on the left which slid down on him. We may switch to doing bilateral in the book wraps. 5/4; I have not seen Mr. Metter in quite some time. According to our case manager he did not have an open wound on his left leg last week. He had 1 remaining wound on the right posterior medial calf. He arrives today with multiple openings on the left leg probably were blisters and/or wrap injuries from Unna boots. I do not think the Unna boot's will provide adequate compression on the left. I am also not clear about the frequency he is using the compression pumps. 03/17/19 on evaluation today patient appears to be doing excellent in regard to his lower extremities compared to last week's evaluation apparently. He had gotten significantly worse last week which is unfortunate. The [employer] wrap on the left did not seem to do very well for him at all and in fact it didn't control his swelling significantly enough he had an additional outbreak. Subsequently we go back to the four layer compression wrap on the left. This is good news. At least in that he is doing better and the wound seem to be killing him. He still has not heard anything from the lymphedema clinic. 03/24/19 on evaluation today patient actually appears to be doing much better in regard to his bilateral lower Trinity as compared to last week when I saw him. Fortunately there's no signs of active infection at this time. He  has been tolerating the dressing changes without complication. Overall I'm extremely pleased with the progress and appearance in general. 04/07/19 on evaluation today patient appears to be doing well in regard to his bilateral lower extremities. His swelling is significantly down from where it was previous. With that being said he does have a couple blisters still open at this point but fortunately nothing that seems to be too severe and again the majority of the larger openings has healed at this time. 04/14/19 on evaluation today patient actually appears to be doing quite well in regard to his bilateral lower extremities in fact I'm not even sure there's anything significantly open at this time at any site. Nonetheless he did have some trouble with these wraps where they are somewhat irritating him secondary to the fact that he has noted that the graph wasn't too close down to the end of this foot in a little bit short as well up to his knee. Otherwise things seem to be doing quite well. 04/21/19 upon evaluation today patient's wound bed actually showed evidence of being completely healed in regard to both lower extremities which is excellent news. There does not appear to be any signs of active infection which is also good news. I'm very pleased in this regard. No fevers, chills, nausea, or vomiting noted at this time. 04/28/19 on evaluation today patient appears to be doing a little bit worse in regard  to both lower extremities on the left mainly due to the fact that when he went infection disease the wrap was not wrapped quite high enough he developed a blister above this. On the right he is a small open area of nothing too significant but again this is continuing to give him some trouble he has been were in the Velcro compression that he has at home. 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 He's been tolerating the dressing changes  without complication. Fortunately there's no signs of active infection at this time. No fevers, chills, nausea, or vomiting noted at this time. We have been trying to get an appointment with her lymphedema clinic in [facility] but unfortunately nobody can get them on phone with not been able to even fax information over the patient likewise is not been able to get in touch with them. Overall I'm not sure exactly what's going on here with to reach out again today. 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 Still having a lot of drainage unfortunately. He tells me especially in the left but even on the right. There's no signs of active infection which is good news we've been using so ratcheted up to this point. 05/19/19 on evaluation today patient actually appears to be doing quite well with regard to his left lower extremity which is great news. Fortunately in regard to the right lower extremity has an issues with his wrap and he subsequently did remove this from what I'm understanding. Nonetheless long story short is what he had rewrapped once he removed it subsequently had maggots underneath this wrap whenever he came in for evaluation today. With that being said they were obviously completely cleaned away by the nursing staff. The visit today which is excellent news. However he does appear to potentially have some infection around the right ankle region where the maggots were located as well. He will likely require anabiotic therapy today. 05/26/19 on evaluation today patient actually appears to be doing much better in regard to his bilateral lower extremities. I feel like the infection is under much better control. With that being said there were maggots noted when the wrap was removed yet again today. Again this could have potentially been left over from previous although at this time there does  not appear to be any signs of significant drainage there was obviously on the wrap some drainage as well this contracted gnats or otherwise. Either way I do not see anything that appears to be doing worse in my pinion and in fact I think his drainage has slowed down quite significantly likely mainly due to the fact to his infection being under better control. 06/02/2019 on evaluation today patient actually appears to be doing well with regard to his bilateral lower extremities there is no signs of active infection at this time which is great news. With that being said he does have several open areas more so on the right than the left but nonetheless these are all significantly better than previously noted. 06/09/2019 on evaluation today patient actually appears to be doing well. His wrap stayed up and he did not cause any problems he had more drainage on the right compared to the left but overall I do not see any major issues at this time which is great news. 06/16/2019 on evaluation today patient appears to be  doing excellent with regard to his lower extremities the only area that is open is a new blister that can have opened as of today on the medial ankle on the left. Other than this he really seems to be doing great I see no major issues at this point. 06/23/2019 on evaluation today patient appears to be doing quite well with regard to his bilateral lower extremities. In fact he actually appears to be almost completely healed there is a small area of weeping noted of the right lower extremity just above the ankle. Nonetheless fortunately there is no signs of active infection at this time which is good news. No fevers, chills, nausea, vomiting, or diarrhea. 8/24; the patient arrived for a nurse visit today but complained of very significant pain in the left leg and therefore I was asked to look at this. Noted that he did not have an open area on the left leg last week nevertheless this was wrapped.  The patient states that he is not been able to put his compression pumps on the left leg because of the discomfort. He has not been systemically unwell 06/30/2019 on evaluation today patient unfortunately despite being excellent last week is doing much worse with regard to his left lower extremity today. In fact he had to come in for a nurse on Monday where his left leg had to be rewrapped due to excessive weeping Dr. Leanord Hawking placed him on doxycycline at that point. Fortunately there is no signs of active infection Systemically at this time which is good news. 07/07/2019 in regard to the patient's wounds today he actually seems to be doing well with his right lower extremity there really is nothing open or draining at this point this is great news. Unfortunately the left lower extremity is given him additional trouble at this time. There does not appear to be any signs of active infection nonetheless he does have a lot of edema and swelling noted at this point as well as blistering all of which has led to a much more poor appearing leg at this time compared to where it was 2 weeks ago when it was almost completely healed. Obviously this is a little discouraging for the patient. He is try to contact the lymphedema clinic in [place] he has not been able to get through to them. 07/14/2019 on evaluation today patient actually appears to be doing slightly better with regard to his left lower extremity ulcers. Overall I do feel like at least at the top of the wrap that we have been placing this area has healed quite nicely and looks much better. The remainder of the leg is showing signs of improvement. Unfortunately in the thigh area he still has an open region on the left and again on the right he has been utilizing just a Band-Aid on an area that also opened on the thigh. Again this is an area that were not able to wrap although we did do an Ace wrap to provide some compression that something that  obviously is a little less effective than the compression wraps we have been using on the lower portion of the leg. He does have an appointment with the lymphedema clinic in [facility] on Friday. 07/21/2019 on evaluation today patient appears to be doing better with regard to his lower extremity ulcers. He has been tolerating the dressing changes without complication. Fortunately there is no signs of active infection at this time. No fevers, chills, nausea, vomiting, or diarrhea. I did receive  the paperwork from the physical therapist at the lymphedema clinic in [place]. Subsequently I signed off on that this morning and sent that back to him for further progression with the treatment plan. 07/28/2019 on evaluation today patient appears to be doing very well with regard to his right lower extremity where I do not see any open wounds at this point. Fortunately he is feeling great as far as that is concerned as well. In regard to the left lower extremity he has been having issues with still several areas of weeping and edema although the upper leg is doing better his lower leg still I think is going require the compression wrap at this time. No fevers, chills, nausea, vomiting, or diarrhea. 08/04/2019 on evaluation today patient unfortunately is having new wounds on the right lower extremity. Again we have been using Unna boot wrap on that side. We switched him to using his juxta lite wrap at home. With that being said he tells me he has been using it although his legs extremely swollen and to be honest really does not appear that he has been. I cannot know that for sure however. Nonetheless he has multiple new wounds on the right lower extremity at this time. Obviously we will have to see about getting this rewrapped for him today. 08/11/2019 on evaluation today patient appears to be doing fairly well with regard to his wounds. He has been tolerating the dressing changes including the  compression wraps without complication. He still has a lot of edema in his upper thigh regions bilaterally he is supposed to be seeing the lymphedema clinic on the 15th of this month once his wraps arrive for the upper part of his legs. 08/18/2019 on evaluation today patient appears to be doing well with regard to his bilateral lower extremities at this point. He has been tolerating the dressing changes without complication. Fortunately there is no signs of active infection which is also good news. He does have a couple weeping areas on the first and second toe of the right foot he also has just a small area on the left foot upper leg and a small area on the left lower leg but overall he is doing quite well in my opinion. He is supposed to be getting his wraps shortly in fact tomorrow and then subsequently is seeing the lymphedema clinic next Wednesday on the 21st. Of note he is also leaving on the 25th to go on vacation for a week to the beach. For that reason and since there is some uncertainty about what there can be doing at lymphedema clinic next Wednesday I am get a make an appointment for next Friday here for [place] to see what we need to do for him prior to him leaving for vacation. 10/23; patient arrives in considerable pain predominantly in the upper posterior calf just distal to the popliteal fossa also in the wound anteriorly above the major wound. This is probably cellulitis and he has had this recurrently in the past. He has no open wound on the right side and he has had an Radio broadcast assistant in that area. Finally I note that he has an area on the left posterior calf which by enlarge is mostly epithelialized. This protrudes beyond the borders of the surrounding skin in the setting of dry scaly skin and lymphedema. The patient is leaving for [facility] on Sunday. Per his longstanding pattern, he will not take his compression pumps with him predominantly out of fear that they  will be stolen. He  therefore asked that we put a Unna boot back on the right leg. He will also contact the wound care center in [facility] to see if they can change his dressing in the mid week. 11/3; patient returned from his vacation to [facility]. He was seen on 1 occasion at their wound care center. They did a 2 layer compression system as they did not have our 4-layer wrap. I am not completely certain what they put on the wounds. They did not change the Unna boot on the right. The patient is also seeing a lymphedema specialist physical therapist in [place]. It appears that he has some compression sleeve for his thighs which indeed look quite a bit better than I am used to seeing. He pumps over these with his external compression pumps. 11/10; the patient has a new wound on the right medial thigh otherwise there is no open areas on the right. He has an area on the left leg posteriorly anteriorly and medially and an area over the left second toe. We have been using silver alginate. He thinks the injury on his thigh is secondary to friction from the compression sleeve he has. 11/17; the patient has a new wound on the right medial thigh last week. He thinks this is because he did not have a underlying stocking for his thigh juxta lite apparatus. He now has this. The area is fairly large and somewhat angry but I do not think he has underlying cellulitis. He has a intact blister on the right anterior tibial area. Small wound on the right great toe dorsally Small area on the medial left calf. 11/30; the patient does not have any open areas on his right leg and we did not take his juxta lite stocking off. However he states that on Friday his compression wrap fell down lodging around his upper mid calf area. As usual this creates a lot of problems for him. He called urgently today to be seen for a nurse visit however the nurse visit turned into a provider visit because of extreme erythema and pain in the  left anterior tibia extending laterally and posteriorly. The area that is problematic is extensive 10/06/2019 upon evaluation today patient actually appears to be doing poorly in regard to his left lower extremity. He Dr. Leanord Hawking did place him on doxycycline this past Monday apparently due to the fact that he was doing much worse in regard to this left leg. Fortunately the doxycycline does seem to be helping. Unfortunately we are still having a very difficult time getting his edema under any type of control in order to anticipate discharge at some point. The only way were really able to control his lymphedema really is with compression wraps and that has only even seemingly temporary. He has been seeing a lymphedema clinic they are trying to help in this regard but still this has been somewhat frustrating in general for the patient. 10/13/19 on evaluation today patient appears to be doing excellent with regard to his right lower extremity as far as the wounds are concerned. His swelling is still quite extensive unfortunately. He is still having a lot of drainage from the thigh areas bilaterally which is unfortunate. He's been going to lymphedema clinic but again he still really does not have this edema under control as far as his lower extremities are concern. With regard to his left lower extremity this seems to be improving and I do believe the doxycycline has been  of benefit for him. He is about to complete the doxycycline. 10/20/2019 on evaluation today patient appears to be doing poorly in regard to his bilateral lower extremities. More in the right thigh he has a lot of irritation at this site unfortunately. In regard to the left lower extremity the wrap was not quite as high it appears and does seem to have caused him some trouble as well. Fortunately there is no evidence of systemic infection though he does have some blue-green drainage which has me concerned for the possibility of  Pseudomonas. He tells me he is previously taking Cipro without complications and he really does not care for Levaquin however due to some of the side effects he has. He is not allergic to any medications specifically antibiotics that were aware of. 10/27/2019 on evaluation today patient actually does appear to be for the most part doing better when compared to last week's evaluation. With that being said he still has multiple open wounds over the bilateral lower extremities. He actually forgot to start taking the Cipro and states that he still has the whole bottle. He does have several new blisters on left lower extremity today I think I would recommend he go ahead and take the Cipro based on what I am seeing at this point. 12/30-Patient comes at 1 week visit, 4 layer compression wraps on the left and Unna boot on the right, primary dressing Xtrasorb and silver alginate. Patient is taking his Cipro and has a few more days left probably 5-6, and the legs are doing better. He states he is using his compressions devices which I believe he has 11/10/2019 on evaluation today patient actually appears to be much better than last time I saw him 2 weeks ago. His wounds are significantly improved and overall I am very pleased in this regard. Fortunately there is no signs of active infection at this time. He is just a couple of days away from completing Cipro. Overall his edema is much better he has been using his lymphedema pumps which I think is also helping at this point. 11/17/2019 on evaluation today patient appears to be doing excellent in regard to his wounds in general. His legs are swollen but not nearly as much as they have been in the past. Fortunately he is tolerating the compression wraps without complication. No fevers, chills, nausea, vomiting, or diarrhea. He does have some erythema however in the distal portion of his right lower extremity specifically around the forefoot and toes there is a  little bit of warmth here as well. 11/24/2019 on evaluation today patient appears to be doing well with regard to his right lower extremity I really do not see any open wounds at this point. His left lower extremity does have several open areas and his right medial thigh also is open. Other than this however overall the patient seems to be making good progress and I am very pleased at this point. 12/01/2019 on evaluation today patient appears to be doing poorly at this point in regard to his left lower extremity has several new blisters despite the fact that we have him in compression wraps. In fact he had a 4-layer compression wrap, his upper thigh wrapped from lymphedema clinic, and a juxta light over top of the 4 layer compression wrap the lymphedema clinic applied and despite all this he still develop blisters underneath. Obviously this does have me concerned about the fact that unfortunately despite what we are doing to try to get wounds  healed he continues to have new areas arise I do not think he is ever good to be at the point where he can realistically just use wraps at home to keep things under control. Typically when we heal him it takes about 1-2 days before he is back in the clinic with severe breakdown and blistering of his lower extremities bilaterally. This is happened numerous times in the past. Unfortunately I think that we may need some help as far as overall fluid overload to kind of limit what we are seeing and get things under better control. 12/08/2019 on evaluation today patient presents for follow-up concerning his ongoing bilateral lower extremity edema. Unfortunately he is still having quite a bit of swelling the compression wraps are controlling this to some degree but he did see Dr. [name] his cardiologist I do have that available for review today as far as the appointment was concerned that was on 12/06/2019. Obviously that she has been 2 days ago. The patient states that he  is only been taking the Lasix 80 mg 1 time a day he had told me previously he was taking this twice a day. Nonetheless Dr. [name] recommended this be up to 80 mg 2 times a day for the patient as he did appear to be fluid overloaded. With that being said the patient states he did this yesterday and he was unable to go anywhere or do anything due to the fact that he was constantly having to urinate. Nonetheless I think that this is still good to be something that is important for him as far as trying to get his edema under control at all things that he is going to be able to just expect his wounds to get under control and things to be better without going through at least a period of time where he is trying to stabilize his fluid management in general and I think increasing the Lasix is likely the first step here. It was also mentioned the possibility that the patient may require metolazone. With that being said he wanted to have the patient take Lasix twice a day first and then reevaluating 2 months to see where things stand. 12/15/2019 upon evaluation today patient appears to be doing regard to his legs although his toes are showing some signs of weeping especially on the left at this point to some degree on the right. There does not appear to be any signs of active infection and overall I do feel like the compression wraps are doing well for him but he has not been able to take the Lasix at home and the increased dose that Dr. [name] recommended. He tells me that just not go to be feasible for him. Nonetheless I think in this case he should probably send a message to Dr. [name] in order to discuss options from the standpoint of possible admission to get the fluid off or otherwise going forward. Electronic Signature(s) Signed: 12/15/2019 11:11:01 AM By: Lenda Kelp PA-C Entered By: Lenda Kelp on 12/15/2019  11:11:01 -------------------------------------------------------------------------------- Physical Exam Details Patient Name: Date of Service: JIHAN, RUDY 12/15/2019 10:15 AM Medical Record [id number] Patient Account Number: [id number] Date of Birth/Sex: Treating RN: October 21, 1951 (69 y.o. [name] Primary Care Provider: Nicoletta Ba Other Clinician: Referring Provider: Treating Provider/Extender:[name], [name], [name] in Treatment: 203 Constitutional Obese and well-hydrated in no acute distress. Respiratory normal breathing without difficulty. Psychiatric this patient is able to make decisions and demonstrates good insight into  disease process. Alert and Oriented x 3. pleasant and cooperative. Notes Patient's wound bed currently on the legs all appears to be healed except for on the right inner thigh where he does still have an open wound at this location he still using his lymphedema wraps.. That has subsequently led to new small openings with regard to his toes he has several areas that are just somewhat open and weeping due to the lymphedema and again the fact that we really cannot wrap the toes with the compression wrap at this point which I think is going to continue to be the issue until he has a much better control over his fluid load in general. Electronic Signature(s) Signed: 12/15/2019 11:11:39 AM By: Lenda Kelp PA-C Entered By: Lenda Kelp on 12/15/2019 11:11:39 -------------------------------------------------------------------------------- Physician Orders Details Patient Name: Date of Service: DHYAN, NOAH 12/15/2019 10:15 AM Medical Record [id number] Patient Account Number: [id number] Date of Birth/Sex: Treating RN: [birth year] (69 y.o. 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 Dressing Change Frequency Do not change entire dressing for one week. - both lower legs Skin Barriers/Peri-Wound Care Moisturizing lotion TCA Cream or Ointment - to legs mixed with lotion Wound Cleansing May shower with protection. Primary Wound Dressing Wound #145R Right,Medial Upper Leg Calcium Alginate with Silver Wound #156 Left,Dorsal Foot Calcium Alginate with Silver Secondary Dressing Wound #145R Right,Medial Upper Leg ABD pad - secured with tape Wound #156 Left,Dorsal Foot Dry Gauze Edema Control 4 layer compression: Left lower extremity - unna boot at upper portion of lower leg. pad bend of foot with foam Unna Boot to Right Lower Extremity Avoid standing for long periods of time Elevate legs to the level of the heart or above for 30 minutes daily and/or when sitting, a frequency of: - throughout the day Exercise regularly Segmental Compressive Device. - lymphadema pumps 60 min 2 times per day Other: - lymphadema wraps to upper leg per [facility] clinic Electronic Signature(s) Signed: 12/15/2019 5:39:41 PM By: Zenaida Deed RN, BSN Signed: 12/15/2019 6:32:46 PM By: Lenda Kelp PA-C Entered By: Zenaida Deed on 12/15/2019 11:06:14 -------------------------------------------------------------------------------- Problem List Details Patient Name: Date of Service: LEOCADIO, HEAL 12/15/2019 10:15 AM Medical Record [id number] Patient Account Number: [id number] Date of Birth/Sex: Treating RN: 1950-12-11 (69 y.o. [name] Primary Care Provider: Other Clinician: Nicoletta Ba Referring Provider: Treating Provider/Extender:[name], [name], [name] in Treatment: 203 Active Problems ICD-10 Evaluated Encounter Code Description Active Date Today Diagnosis L97.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 right calf limited to 06/30/2018 No Yes breakdown of skin L97.221 Non-pressure chronic ulcer of left calf limited to 09/30/2016 No Yes breakdown of skin I87.333 Chronic venous hypertension (idiopathic) with ulcer 01/22/2016 No Yes and inflammation of bilateral lower extremity I89.0 Lymphedema, not elsewhere classified 01/22/2016 No Yes E11.622 Type 2 diabetes mellitus with other skin ulcer 01/22/2016 No Yes 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, 01/22/2016 No Yes  unspecified L03.116 Cellulitis of left lower limb 04/01/2017 No Yes Inactive Problems ICD-10 Code Description Active Date Inactive Date 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 06/30/2017 skin L97.521 Non-pressure chronic ulcer of other part of left foot limited to 04/27/2018 04/27/2018 breakdown of skin L03.115 Cellulitis of right lower limb 12/22/2017 12/22/2017 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 [name] Primary Care Provider: Nicoletta Ba Other Clinician: Referring Provider: Treating Provider/Extender:[name], [name], [name] in Treatment: 203 Subjective Chief Complaint Information obtained from Patient patient is here for evaluation venous/lymphedema weeping History of Present Illness (HPI) Referred by PCP for consultation. Patient has long standing history of BLE venous stasis, no prior ulcerations. At beginning of month, developed cellulitis and weeping. Received IM Rocephin followed by Keflex and resolved. Wears compression stocking, appr 6 months old. Not sure strength. No present drainage. 01/22/16 this is a patient who is a type II diabetic on insulin. 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 He tells me he religiously wears pressure stockings of uncertain strength. He was here with weeping edema about 8 months ago but did not have an open wound. Roughly a month ago he had a reopening on his bilateral legs. He is been using bandages and Neosporin. He does not complain of pain. 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 Last BUN/creatinine I have is from 11/20/15 at 13 and 1.0 respectively 01/29/16; patient arrives today having tolerated the Profore wrap. He brought in his stockings and these are 18 mmHg stockings he bought from [employer]. The compression here is likely inadequate. He does not complain of pain or excessive drainage she has no systemic symptoms. The wound on the right looks improved as does the one on the left although one on the left is more substantial with still tissue at risk below the actual wound area on the bilateral posterior calf 02/05/16; patient arrives with poor edema control. He states that we did put a 4  layer compression on it last week. No weight appear 5 this. 02/12/16; the area on the posterior right Has healed. The left Has a substantial wound that has necrotic surface eschar that requires a debridement with a curette. 02/16/16;the patient called or a Nurse visit secondary to increased swelling. He had been in earlier in the week with his right leg healed. He was transitioned to is on pressure stocking on the right leg with the only open wound on the left, a substantial area on the left posterior calf. Note he has a history of severe lower extremity edema, he has a history of chronic atrial fibrillation but not heart failure per my notes but I'll need to research this. He is not complaining of chest pain shortness of breath or orthopnea. The intake nurse noted blisters on the previously closed right leg 02/19/16; this is the patient's regular visit day. I see him on Friday with escalating edema new  wounds on the right leg and clear signs of at least right ventricular heart failure. I increased his Lasix to 40 twice a day. He is returning currently in follow-up. States he is noticed a decrease in that the edema 02/26/16 patient's legs have much less edema. There is nothing really open on the right leg. The left leg has improved condition of the large superficial wound on the posterior left leg 03/04/16; edema control is very much better. The patient's right leg wounds have healed. On the left leg he continues to have severe venous inflammation on the posterior aspect of the left leg. There is no tenderness and I don't think any of this is cellulitis. 03/11/16; patient's right leg is married healed and he is in his own stocking. The patient's left leg has deteriorated somewhat. There is a lot of erythema around the wound on the posterior left leg. There is also a significant rim of erythema posteriorly just above where the wrap would've ended there is a new wound in this location and a lot  of tenderness. Can't rule out cellulitis in this area. 03/15/16; patient's right leg remains healed and he is in his own stocking. The patient's left leg is much better than last review. His major wound on the posterior aspect of his left Is almost fully epithelialized. He has 3 small injuries from the wraps. Really. Erythema seems a lot better on antibiotics 03/18/16; right leg remains healed and he is in his own stocking. The patient's left leg is much better. The area on the posterior aspect of the left calf is fully epithelialized. His 3 small injuries which were wrap injuries on the left are improved only one seems still open his erythema has resolved 03/25/16; patient's right leg remains healed and he is in his own stocking. There is no open area today on the left leg posterior leg is completely closed up. His wrap injuries at the superior aspect of his leg are also resolved. He looks as though he has some irritation on the dorsal ankle but this is fully epithelialized without evidence of infection. 03/28/16; we discharged this patient on Monday. Transitioned him into his own stocking. There were problems almost immediately with uncontrolled swelling weeping edema multiple some of which have opened. He does not feel systemically unwell in particular no chest pain no shortness of breath and he does not feel 04/08/16; the edema is under better control with the Profore light wrap but he still has pitting edema. There is one large wound anteriorly 2 on the medial aspect of his left leg and 3 small areas on the superior posterior calf. Drainage is not excessive he is tolerating a Profore light well 04/15/16; put a Profore wrap on him last week. This is controlled is edema however he had a lot of pain on his left anterior foot most of his wounds are healed 04/22/16 once again the patient has denuded areas on the left anterior foot which he states are because his wrap slips up word. He saw his primary  physician today is on Lasix 40 twice a day and states that he his weight is down 20 pounds over the last 3 months. 04/29/16: Much improved. left anterior foot much improved. He is now on Lasix 80 mg per day. Much improved edema control 05/06/16; I was hoping to be able to discharge him today however once again he has blisters at a low level of where the compression was placed last week mostly on his left  lateral but also his left medial leg and a small area on the anterior part of the left foot. 05/09/16; apparently the patient went home after his appointment on 7/4 later in the evening developing pain in his upper medial thigh together with subjective fever and chills although his temperature was not taken. The pain was so intense he felt he would probably have to call 911. However he then remembered that he had leftover doxycycline from a previous round of antibiotics and took these. By the next morning he felt a lot better. He called and spoke to one of our nurses and I approved doxycycline over the phone thinking that this was in relation to the wounds we had previously seen although they were definitely were not. The patient feels a lot better old fever no chills he is still working. Blood sugars are reasonably controlled 05/13/16; patient is back in for review of his cellulitis on his anterior medial upper thigh. He is taking doxycycline this is a lot better. Culture I did of the nodular area on the dorsal aspect of his foot grew MRSA this also looks a lot better. 05/20/16; the patient is cellulitis on the medial upper thigh has resolved. All of his wound areas including the left anterior foot, areas on the medial aspect of the left calf and the lateral aspect of the calf at all resolved. He has a new blister on the left dorsal foot at the level of the fourth toe this was excised. No evidence of infection 05/27/16; patient continues to complain weeping edema. He has new blisterlike wounds on the  left anterior lateral and posterior lateral calf at the top of his wrap levels. The area on his left anterior foot appears better. He is not complaining of fever, pain or pruritus in his feet. 05/30/16; the patient's blisters on his left anterior leg posterior calf all look improved. He did not increase the Lasix 100 mg as I suggested because he was going to run out of his 40 mg tablets. He is still having weeping edema of his toes 06/03/16; I renewed his Lasix at 80 mg once a day as he was about to run out when I last saw him. He is on 80 mg of Lasix now. I have asked him to cut down on the excessive amount of water he was drinking and asked him to drink according to his thirst mechanisms 06/12/2016 -- was seen 2 days ago and was supposed to wear his compression stockings at home but he is developed lymphedema and superficial blisters on the left lower extremity and hence came in for a review 06/24/16; the remaining wound is on his left anterior leg. He still has edema coming from between his toes. There is lymphedema here however his edema is generally better than when I last saw this. He has a history of atrial fibrillation but does not have a known history of congestive heart failure nevertheless I think he probably has this at least on a diastolic basis. 07/01/16 I reviewed his echocardiogram from January 2017. This was essentially normal. He did not have LVH, EF of 55-60%. His right ventricular function was normal although he did have trivial tricuspid and pulmonic regurgitation. This is not audible on exam however. I increased his Lasix to do massive edema in his legs well above his knees I think in early July. He was also drinking an excessive amount of water at the time. 07/15/16; missed his appointment last week because of the Labor  Day holiday on Monday. He could not get another appointment later in the week. Started to feel the wrap digging in superiorly so we remove the top half and  the bottom half of his wrap. He has extensive erythema and blistering superiorly in the left leg. Very tender. Very swollen. Edema in his foot with leaking edema fluid. He has not been systemically unwell 07/22/16; the area on the left leg laterally required some debridement. The medial wounds look more stable. His wrap injury wounds appear to have healed. Edema and his foot is better, weeping edema is also better. He tells me he is meeting with the supplier of the external compression pumps at work 08/05/16; the patient was on vacation last week in [facility]. His wrap is been on for an extended period of time. Also over the weekend he developed an extensive area of tender erythema across his anterior medial thigh. He took to doxycycline yesterday that he had leftover from a previous prescription. The patient complains of weeping edema coming out of his toes 08/08/16; I saw this patient on 10/2. He was tender across his anterior thigh. I put him on doxycycline. He returns today in follow-up. He does not have any open wounds on his lower leg, he still has edema weeping into his toes. 08/12/16; patient was seen back urgently today to follow-up for his extensive left thigh cellulitis/erysipelas. He comes back with a lot less swelling and erythema pain is much better. I believe I gave him Augmentin and Cipro. His wrap was cut down as he stated a roll down his legs. He developed blistering above the level of the wrap that remained. He has 2 open blisters and 1 intact. 08/19/16; patient is been doing his primary doctor who is increased his Lasix from 40-80 once a day or 80 already has less edema. Cellulitis has remained improved in the left thigh. 2 open areas on the posterior left calf 08/26/16; he returns today having new open blisters on the anterior part of his left leg. He has his compression pumps but is not yet been shown how to use some vital representative from the supplier. 09/02/16 patient  returns today with no open wounds on the left leg. Some maceration in his plantar toes 09/10/2016 -- Dr. Leanord Hawking had recently discharged him on 09/02/2016 and he has come right back with redness swelling and some open ulcers on his left lower extremity. He says this was caused by trying to apply his compression stockings and he's been unable to use this and has not been able to use his lymphedema pumps. He had some doxycycline leftover and he has started on this a few days ago. 09/16/16; there are no open wounds on his leg on the left and no evidence of cellulitis. 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 He does not complain of symptoms here. I am not clear use using his external compression pumps. 09/23/16; I have not seen this patient in 2 weeks. He canceled his appointment 10 days ago as he was going on vacation. He tells me that on Monday he noticed a large area on his posterior left leg which is been draining copiously and is reopened into a large wound. He is been using ABDs and the external part of his juxtalite, according to our nurse this was not on properly. 10/07/16; Still a substantial area on the posterior left leg. Using silver alginate 10/14/16; in general better although there  is still open area which looks healthy. Still using silver alginate. He reminds me that this happen before he left for [facility]. Today while he was showering in the morning. He had been using his juxtalite's 10/21/16; the area on his posterior left leg is fully epithelialized. However he arrives today with a large area of tender erythema in his medial and posterior left thigh just above the knee. I have marked the area. Once again he is reluctant to consider hospitalization. I treated him with oral antibiotics in the past for a similar situation with resolution I think with doxycycline however this area it seems more extensive to me. He is not  complaining of fever but does have chills and says states he is thirsty. His blood sugar today was in the 140s at home 10/25/16 the area on his posterior left leg is fully epithelialized although there is still some weeping edema. The large area of tenderness and erythema in his medial and posterior left thigh is a lot less tender although there is still a lot of swelling in this thigh. He states he feels a lot better. He is on doxycycline and Augmentin that I started last week. This will continued until Tuesday, December 26. I have ordered a duplex ultrasound of the left thigh rule out DVT whether there is an abscess something that would need to be drained I would also like to know. 11/01/16; he still has weeping edema from a not fully epithelialized area on his left posterior calf. Most of the rest of this looks a lot better. He has completed his antibiotics. His thigh is a lot better. Duplex ultrasound did not show a DVT in the thigh 11/08/16; he comes in today with more Denuded surface epithelium from the posterior aspect of his calf. There is no real evidence of cellulitis. The superior aspect of his wrap appears to have put quite an indentation in his leg just below the knee and this may have contributed. He does not complain of pain or fever. We have been using silver alginate as the primary dressing. The area of cellulitis in the right thigh has totally resolved. He has been using his compression stockings once a week 11/15/16; the patient arrives today with more loss of epithelium from the posterior aspect of his left calf. He now has a fairly substantial wound in this area. The reason behind this deterioration isn't exactly clear although his edema is not well controlled. He states he feels he is generally more swollen systemically. He is not complaining of chest pain shortness of breath fever. Tells me he has an appointment with his primary physician in early February. He is on 80 mg of  oral Lasix a day. He claims compliance with the external compression pumps. He is not having any pain in his legs similar to what he has with his recurrent cellulitis 11/22/16; the patient arrives a follow-up of his large area on his left lateral calf. This looks somewhat better today. He came in earlier in the week for a dressing change since I saw him a week ago. He is not complaining of any pain no shortness of breath no chest pain 11/28/16; the patient arrives for follow-up of his large area on the left lateral calf this does not look better. In fact it is larger weeping edema. The surface of the wound does not look too bad. We have been using silver alginate although I'm not certain that this is a dressing issue. 12/05/16; again the  patient follows up for a large wound on the left lateral and left posterior calf this does not look better. There continues to be weeping edema necrotic surface tissue. More worrisome than this once again there is erythema below the wound involving the distal Achilles and heel suggestive of cellulitis. He is on his feet working most of the day of this is not going well. We are changing his dressing twice a week to facilitate the drainage. 12/12/16; not much change in the overall dimensions of the large area on the left posterior calf. This is very inflamed looking. I gave him an. Doxycycline last week does not really seem to have helped. He found the wrap very painful indeed it seems to of dog into his legs superiorly and perhaps around the heel. He came in early today because the drainage had soaked through his dressings. 12/19/16- patient arrives for follow-up evaluation of his left lower extremity ulcers. He states that he is using his lymphedema pumps once daily when there is "no drainage". He admits to not using his lipedema pumps while under current treatment. His blood sugars have been consistently between 150-200. 12/26/16; the patient is not using his compression  pumps at home because of the wetness on his feet. I've advised him that I think it's important for him to use this daily. He finds his feet too wet, he can put a plastic bag over his legs while he is in the pumps. Otherwise I think will be in a vicious circle. We are using silver alginate to the major area on his left posterior calf 01/02/17; the patient's posterior left leg has further of all into 3 open wounds. All of them covered with a necrotic surface. He claims to be using his compression pumps once a day. His edema control is marginal. Continue with silver alginate 01/10/17; the patient's left posterior leg actually looks somewhat better. There is less edema, less erythema. Still has 3 open areas covered with a necrotic surface requiring debridement. He claims to be using his compression pumps once a day his edema control is better 01/17/17; the patient's left posterior calf look better last week when I saw him and his wrap was changed 2 days ago. He has noted increasing pain in the left heel and arrives today with much larger wounds extensive erythema extending down into the entire heel area especially tender medially. He is not systemically unwell CBGs have been controlled no fever. Our intake nurse showed me limegreen drainage on his AVD pads. 01/24/17; his usual this patient responds nicely to antibiotics last week giving him Levaquin for presumed Pseudomonas. The whole entire posterior part of his leg is much better much less inflamed and in the case of his Achilles heel area much less tender. He has also had some epithelialization posteriorly there are still open areas here and still draining but overall considerably better 01/31/17- He has continue to tolerate the compression wraps. he states that he continues to use the lymphedema pumps daily, and can increase to twice daily on the weekends. He is voicing no complaints or concerns regarding his LLE ulcers 02/07/17-he is here for follow-up  evaluation. He states that he noted some erythema to the left medial and anterior thigh, which he states is new as of yesterday. He is concerned about recurrent cellulitis. He states his blood sugars have been slightly elevated, this morning in the 180s 02/14/17; he is here for follow-up evaluation. When he was last here there was erythema superiorly from  his posterior wound in his anterior thigh. He was prescribed Levaquin however a culture of the wound surface grew MRSA over the phone I changed him to doxycycline on Monday and things seem to be a lot better. 02/24/17; patient missed his appointment on Friday therefore we changed his nurse visit into a physician visit today. Still using silver alginate on the large area of the posterior left thigh. He isn't new area on the dorsal left second toe 03/03/17; actually better today although he admits he has not used his external compression pumps in the last 2 days or so because of work responsibilities over the weekend. 03/10/17; continued improvement. External compression pumps once a day almost all of his wounds have closed on the posterior left calf. Better edema control 03/17/17; in general improved. He still has 3 small open areas on the lateral aspect of his left leg however most of the area on the posterior part of his leg is epithelialized. He has better edema control. He has an ABD pad under his stocking on the right anterior lower leg although he did not let us look at that today. 03/24/17; patient arrives back in clinic today with no open areas however there are areas on the posterior left calf and anterior left calf that are less than 100% epithelialized. His edema is well controlled in the left lower leg. There is some pitting edema probably lymphedema in the left upper thigh. He uses compression pumps at home once per day. I tried to get him to do this twice a day although he is very reticent. 04/01/2017 -- for the last 2 days he's had  significant redness, tenderness and weeping and came in for an urgent visit today. 04/07/17; patient still has 6 more days of doxycycline. He was seen by Dr. Meyer Russel last Wednesday for cellulitis involving the posterior aspect, lateral aspect of his Involving his heel. For the most part he is better there is less erythema and less weeping. He has been on his feet for 12 hours o2 over the weekend. Using his compression pumps once a day 04/14/17 arrives today with continued improvement. Only one area on the posterior left calf that is not fully epithelialized. He has intense bilateral venous inflammation associated with his chronic venous insufficiency disease and secondary lymphedema. We have been using silver alginate to the left posterior calf wound In passing he tells [place] today that the right leg but we have not seen in quite some time has an open area on it but he doesn't want [place] to look at this today states he will show this to [place] next week. 04/21/17; there is no open area on his left leg although he still reports some weeping edema. He showed [place] his right leg today which is the first time we've seen this leg in a long time. He has a large area of open wound on the right leg anteriorly healthy granulation. Quite a bit of swelling in the right leg and some degree of venous inflammation. He told us about the right leg in passing last week but states that deterioration in the right leg really only happened over the weekend 04/28/17; there is no open area on the left leg although there is an irritated part on the posterior which is like a wrap injury. The wound on the right leg which was new from last week at least to [place] is a lot better. 05/05/17; still no open area on the left leg. Patient is using his new  compression stocking which seems to be doing a good job of controlling the edema. He states he is using his compression pumps once per day. The right leg still has an open wound although it is better  in terms of surface area. Required debridement. A lot of pain in the posterior right Achilles marked tenderness. Usually this type of presentation this patient gives concern for an active cellulitis 05/12/17; patient arrives today with his major wound from last week on the right lateral leg somewhat better. Still requiring debridement. He was using his compression stocking on the left leg however that is reopened with superficial wounds anteriorly he did not have an open wound on this leg previously. He is still using his juxta light's once daily at night. He cannot find the time to do this in the morning as he has to be at work by 7 AM 05/19/17; right lateral leg wound looks improved. No debridement required. The concerning area is on the left posterior leg which appears to almost have a subcutaneous hemorrhagic component to it. We've been using silver alginate to all the wounds 05/26/17; the right lateral leg wound continues to look improved. However the area on the left posterior calf is a tightly adherent surface. [place] using silver alginate. Because of the weeping edema in his legs there is very little good alternatives. 06/02/17; the patient left here last week looking quite good. Major wound on the left posterior calf and a small one on the right lateral calf. Both of these look satisfactory. He tells me that by Wednesday he had noted increased pain in the left leg and drainage. He called on Thursday and Friday to get an appointment here but we were blocked. He did not go to urgent care or his primary physician. He thinks he had a fever on Thursday but did not actually take his temperature. He has not been using his compression pumps on the left leg because of pain. I advised him to go to the emergency room today for IV antibiotics for stents of left leg cellulitis but he has refused I have asked him to take 2 days off work to keep his leg elevated and he has refused this as well. In view of  this I'm going to call him and Augmentin and doxycycline. 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 However after a detailed discussion regarding the risk benefits and alternatives he refuses to get admitted to the hospital. With no other recourse I will continue him on oral antibiotics as before and hopefully he'll have his infectious disease consultation this week. 06/16/2017 -- the patient was seen today by the nurse practitioner at infectious disease Ms. Dixon. 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 He has also been advise Lotrimin cream locally. she also had wise IV antibiotics for his condition if it flares up 06/23/17; patient arrives today with drainage bilaterally although the remaining wound on the left posterior calf after cleaning up today "highlighter yellow drainage" did not look too bad. Unfortunately he has had breakdown on the right anterior  leg [previously this leg had not been open and he is using a black stocking] he went to see infectious disease and is been put on clindamycin 150 mg daily, I did not verify the dose although I'm not familiar with using clindamycin in this dosing range, perhaps for prophylaxisoo 06/27/17; I brought this patient back today to follow-up on the wound deterioration on the right lower leg together with surrounding cellulitis. I started him on doxycycline 4 days ago. This area looks better however he comes in today with intense cellulitis on the medial part of his left thigh. This is not have a wound in this area. Extremely  tender. We've been using silver alginate to the wounds on the right lower leg left lower leg with bilateral 4 layer compression he is using his external compression pumps once a day 07/04/17; patient's left medial thigh cellulitis looks better. He has not been using his compression pumps as his insert said it was contraindicated with cellulitis. His right leg continues to make improvements all the wounds are still open. We only have one remaining wound on the left posterior calf. Using silver alginate to all open areas. He is on doxycycline which I started a week ago and should be finishing I gave him Augmentin after Thursday's visit for the severe cellulitis on the left medial thigh which fortunately looks better 07/14/17; the patient's left medial thigh cellulitis has resolved. The cellulitis in his right lower calf on the right also looks better. All of his wounds are stable to improved we've been using silver alginate he has completed the antibiotics I have given him. He has clindamycin 150 mg once a day prescribed by infectious disease for prophylaxis, I've advised him to start this now. We have been using bilateral Unna boots over silver alginate to the wound areas 07/21/17; the patient is been to see infectious disease who noted his recurrent problems with cellulitis. He was not able to tolerate prophylactic clindamycin therefore he is on amoxicillin 500 twice a day. He also had a second daily dose of Lasix added By Dr. Oneta Rack but he is not taking this. Nor is he being completely compliant with his compression pumps a especially not this week. He has 2 remaining wounds one on the right posterior lateral lower leg and one on the left posterior medial lower leg. 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 The patient has Unna boots bilaterally. Still wounds on his right lateral, left medial, and a new open area on the  left anterior lateral lower leg 08/04/17; he remains on amoxicillin twice a day for prophylaxis of recurrent cellulitis. He has bilateral Unna boots for compression and silver alginate to his wounds. Arrives today with his legs looking as good as I have seen him in quite some time. Not surprisingly his wounds look better as well with improvement on the right lateral leg venous insufficiency wound and also the left medial leg. He is still using the compression pumps once a day 08/11/17; both legs appear to be doing better wounds on the right lateral and left medial legs look better. Skin on the right leg quite good. He is been using silver alginate as the primary dressing. I'm going to use Anasept gel calcium alginate and maintain all the secondary dressings 08/18/17; the patient continues to actually do quite well. The area on his right lateral leg is just about closed the left medial also looks better although  it is still moist in this area. His edema is well controlled we have been using Anasept gel with calcium alginate and the usual secondary dressings, 4 layer compression and once daily use of his compression pumps "always been able to manage 09/01/17; the patient continues to do reasonably well in spite of his trip to [place]. The area on the right lateral leg is epithelialized. Left is much better but still open. He has more edema and more chronic erythema on the left leg [venous inflammation] 09/08/17; he arrives today with no open wound on the right lateral leg and decently controlled edema. Unfortunately his left leg is not nearly as in his good situation as last week.he apparently had increasing edema starting on Saturday. He edema soaked through into his foot so used a plastic bag to walk around his home. The area on the medial right leg which was his open area is about the same however he has lost surface epithelium on the left lateral which is new and he has significant pain in the  Achilles area of the left foot. He is already on amoxicillin chronically for prophylaxis of cellulitis in the left leg 09/15/17; he is completed a week of doxycycline and the cellulitis in the left posterior leg and Achilles area is as usual improved. He still has a lot of edema and fluid soaking through his dressings. There is no open wound on the right leg. He saw infectious disease NP today 09/22/17;As usual 1 we transition him from our compression wraps to his stockings things did not go well. He has several small open areas on the right leg. He states this was caused by the compression wrap on his skin although he did not wear this with the stockings over them. He has several superficial areas on the left leg medially laterally posteriorly. He does not have any evidence of active cellulitis especially involving the left Achilles The patient is traveling from [facility] Saturday going to [facility]. He states he isn't attempting to get an appointment with a heel objects wound center there to change his dressings. I am not completely certain whether this will work 10/06/17; the patient came in on Friday for a nurse visit and the nurse reported that his legs actually look quite good. He arrives in clinic today for his regular follow-up visit. He has a new wound on his left third toe over the PIP probably caused by friction with his footwear. He has small areas on the left leg and a very superficial but epithelialized area on the right anterior lateral lower leg. Other than that his legs look as good as I've seen him in quite some time. We have been using silver alginate Review of systems; no chest pain no shortness of breath other than this a 10 point review of systems negative 10/20/17; seen by Dr. Meyer Russel last week. He had taken some antibiotics [doxycycline] that he had left over. Dr. Meyer Russel thought he had candida infection and declined to give him further antibiotics. He has a small wound  remaining on the right lateral leg several areas on the left leg including a larger area on the left posterior several left medial and anterior and a small wound on the left lateral. The area on the left dorsal third toe looks a lot better. ROS; Gen.; no fever, respiratory no cough no sputum Cardiac no chest pain other than this 10 point review of system is negative 10/30/17; patient arrives today having fallen in the bathtub 3  days ago. It took him a while to get up. He has pain and maceration in the wounds on his left leg which have deteriorated. He has not been using his pumps he also has some maceration on the right lateral leg. 11/03/17; patient continues to have weeping edema especially in the left leg. This saturates his dressings which were just put on on 12/27. As usual the doxycycline seems to take care of the cellulitis on his lower leg. He is not complaining of fever, chills, or other systemic symptoms. He states his leg feels a lot better on the doxycycline I gave him empirically. He also apparently gets injections at his primary doctor's officeo Rocephin for cellulitis prophylaxis. I didn't ask him about his compression pump compliance today I think that's probably marginal. Arrives in the clinic with all of his dressings primary and secondary macerated full of fluid and he has bilateral edema 11/10/17; the patient's right leg looks some better although there is still a cluster of wounds on the right lateral. The left leg is inflamed with almost circumferential skin loss medially to laterally although we are still maintaining anteriorly. He does not have overt cellulitis there is a lot of drainage. He is not using compression pumps. We have been using silver alginate to the wound areas, there are not a lot of options here 11/17/17; the patient's right leg continues to be stable although there is still open wounds, better than last week. The inflammation in the left leg is better. Still  loss of surface layer epithelium especially posteriorly. There is no overt cellulitis in the amount of edema and his left leg is really quite good, tells me he is using his compression pumps once a day. 11/24/17; patient's right leg has a small superficial wound laterally this continues to improve. The inflammation in the left leg is still improving however we have continuous surface layer epithelial loss posteriorly. There is no overt cellulitis in the amount of edema in both legs is really quite good. He states he is using his compression pumps on the left leg once a day for 5 out of 7 days 12/01/17; very small superficial areas on the right lateral leg continue to improve. Edema control in both legs is better today. He has continued loss of surface epithelialization and left posterior calf although I think this is better. We have been using silver alginate with large number of absorptive secondary dressings 4 layer on the left Unna boot on the right at his request. He tells me he is using his compression pumps once a day 12/08/17; he has no open area on the right leg is edema control is good here. ooOn the left leg however he has marked erythema and tenderness breakdown of skin. He has what appears to be a wrap injury just distal to the popliteal fossa. 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 There was swelling erythema and breakdown in the right leg. He has much worse skin breakdown in the left leg as well multiple open areas medially and posteriorly as well as laterally. He tells me he has been using his compression pumps but tells me he feels that the drainage out of his leg is worse when he uses  a compression pumps.  To be fair to him he is been saying this for a while however I don't know that I have really been listening to this. I wonder if the compression pumps are working properly 12/22/17;. Once again he arrives with severe erythema, weeping edema from the left greater than right leg. Noncompliance with compression pumps. New this visit he is complaining of pain on the lateral aspect of the right leg and the medial aspect of his right thigh. He apparently saw his cardiologist Dr. [name] who was ordered an echocardiogram area and I think this is a step in the right direction 12/25/17; started his doxycycline Monday night. There is still intense erythema of the right leg especially in the anterior thigh although there is less tenderness. The erythema around the wound on the right lateral calf also is less tender. He still complaining of pain in the left heel. His wounds are about the same right lateral left medial left lateral. Superficial but certainly not close to closure. He denies being systemically unwell no fever chills no abdominal pain no diarrhea 12/29/17; back in follow-up of his extensive right calf and right thigh cellulitis. I added amoxicillin to cover possible doxycycline resistant strep. This seems to of done the trick he is in much less pain there is much less erythema and swelling. He has his echocardiogram at 11:00 this morning. X-ray of the left heel was also negative. 01/05/18; the patient arrived with his edema under much better control. Now that he is retired he is able to use his compression pumps daily and sometimes twice a day per the patient. He has a wound on the right leg the lateral wound looks better. Area on the left leg also looks a lot better. He has no evidence of cellulitis in his bilateral thighs I had a quick peak at his echocardiogram. He is in normal ejection fraction and normal left ventricular function. He has moderate pulmonary hypertension moderately reduced right  ventricular function. One would have to wonder about chronic sleep apnea although he says he doesn't snore. He'll review the echocardiogram with his cardiologist. 01/12/18; the patient arrives with the edema in both legs under exemplary control. He is using his compression pumps daily and sometimes twice daily. His wound on the right lateral leg is just about closed. He still has some weeping areas on the posterior left calf and lateral left calf although everything is just about closed here as well. I have spoken with Aldean Baker who is the patient's nurse practitioner and infectious disease. She was concerned that the patient had not understood that the parenteral penicillin injections he was receiving for cellulitis prophylaxis was actually benefiting him. I don't think the patient actually saw that I would tend to agree we were certainly dealing with less infections although he had a serious one last month. 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 He is healed. He'll be placed in juxtalite compression wraps and increase his lymphedema pumps to twice daily. We will follow up again next week to ensure there are no issues with the new regiment. 01/20/18-he is here for evaluation of bilateral lower extremity weeping edema. Yesterday he was placed in compression wrap to the right lower extremity and compression stocking to left lower shrubbery. He states he uses lymphedema pumps last night and again this morning and noted a blister to the left lower extremity. On exam he was noted to have drainage to the right lower extremity.  He will be placed in Unna boots bilaterally and follow-up next week 01/26/18; patient was actually discharged a week ago to his own juxta light stockings only to return the next day with bilateral lower extremity weeping edema.he was placed in bilateral Unna boots. He arrives today with pain in the back of his left leg. There is no open  area on the right leg however there is a linear/wrap injury on the left leg and weeping edema on the left leg posteriorly. I spoke with infectious disease about 10 days ago. They were disappointed that the patient elected to discontinue prophylactic intramuscular penicillin shots as they felt it was particularly beneficial in reducing the frequency of his cellulitis. I discussed this with the patient today. He does not share this view. He'll definitely need antibiotics today. Finally he is traveling to [place] and trauma leaving this Saturday and returning a week later and he does not travel with his pumps. He is going by car 01/30/18; patient was seen 4 days ago and brought back in today for review of cellulitis in the left leg posteriorly. I put him on amoxicillin this really hasn't helped as much as I might like. He is also worried because he is traveling to [facility] trauma by car. Finally we will be rewrapping him. There is no open area on the right leg over his left leg has multiple weeping areas as usual 02/09/18; The same wrap on for 10 days. He did not pick up the last doxycycline I prescribed for him. He apparently took 4 days worth he already had. There is nothing open on his right leg and the edema control is really quite good. He's had damage in the left leg medially and laterally especially probably related to the prolonged use of Unna boots 02/12/18; the patient arrived in clinic today for a nurse visit/wrap change. He complained of a lot of pain in the left posterior calf. He is taking doxycycline that I previously prescribed for him. Unfortunately even though he used his stockings and apparently used to compression pumps twice a day he has weeping edema coming out of the lateral part of his right leg. This is coming from the lower anterior lateral skin area. 02/16/18; the patient has finished his doxycycline and will finish the amoxicillin 2 days. The area of cellulitis in the left  calf posteriorly has resolved. He is no longer having any pain. He tells me he is using his compression pumps at least once a day sometimes twice. 02/23/18; the patient finished his doxycycline and Amoxil last week. On Friday he noticed a small erythematous circle about the size of a quarter on the left lower leg just above his ankle. This rapidly expanded and he now has erythema on the lateral and posterior part of the thigh. This is bright red. Also has an area on the dorsal foot just above his toes and a tender area just below the left popliteal fossa. He came off his prophylactic penicillin injections at his own insistence one or 2 months ago. This is obviously deteriorated since then 03/02/18; patient is on doxycycline and Amoxil. Culture I did last week of the weeping area on the back of his left calf grew group B strep. I have therefore renewed the amoxicillin 500 3 times a day for a further week. He has not been systemically unwell. Still complaining of an area of discomfort right under his left popliteal fossa. There is no open wound on the right leg. He tells me  that he is using his pumps twice a day on most days 03/09/18; patient arrives in clinic today completing his amoxicillin today. The cellulitis on his left leg is better. Furthermore he tells me that he had intramuscular penicillin shots that his primary care office today. However he also states that the wrap on his right leg fell down shortly after leaving clinic last week. He developed a large blister that was present when he came in for a nurse visit later in the week and then he developed intense discomfort around this area.He tells me he is using his compression pumps 03/16/18; the patient has completed his doxycycline. The infectious part of this/cellulitis in the left heel area left popliteal area is a lot better. He has 2 open areas on the right calf. Still areas on the left calf but this is a lot better as well. 03/24/18; the  patient arrives complaining of pain in the left popliteal area again. He thinks some of this is wrap injury. He has no open area on the right leg and really no open area on the left calf either except for the popliteal area. He claims to be compliant with the compression pumps 03/31/18; I gave him doxycycline last week because of cellulitis in the left popliteal area. This is a lot better although the surface epithelium is denuded off and response to this. He arrives today with uncontrolled edema in the right calf area as well as a fingernail injury in the right lateral calf. There is only a few open areas on the left 04/06/18; I gave him amoxicillin doxycycline over the last 2 weeks that the amoxicillin should be completing currently. He is not complaining of any pain or systemic symptoms. The only open areas see has is on the right lateral lower leg paradoxically I cannot see anything on the left lower leg. He tells me he is using his compression pumps twice a day on most days. Silver alginate to the wounds that are open under 4 layer compression 04/13/18; he completed antibiotics and has no new complaints. Using his compression pumps. Silver alginate that anything that's opened 04/20/18; he is using his compression pumps religiously. Silver alginate 4 layer compression anything that's opened. He comes in today with no open wounds on the left leg but 3 on the right including a new one posteriorly. He has 2 on the right lateral and one on the right posterior. He likes Unna boots on the right leg for reasons that aren't really clear we had the usual 4 layer compression on the left. It may be necessary to move to the 4 layer compression on the right however for now I left them in the Unna boots 04/27/18; he is using his compression pumps at least once a day. He has still the wounds on the right lateral calf. The area right posteriorly has closed. He does not have an open wound on the left under 4 layer  compression however on the dorsal left foot just proximal to the toes and the left third toe 2 small open areas were identified 05/11/18; he has not uses compression pumps. The areas on the right lateral calf have coalesced into one large wound necrotic surface. On the left side he has one small wound anteriorly however the edema is now weeping out of a large part of his left leg. He says he wasn't using his pumps because of the weeping fluid. I explained to him that this is the time he needs to pump  more 05/18/18; patient states he is using his compression pumps twice a day. The area on the right lateral large wound albeit superficial. On the left side he has innumerable number of small new wounds on the left calf particularly laterally but several anteriorly and medially. All these appear to have healthy granulated base these look like the remnants of blisters however they occurred under compression. The patient arrives in clinic today with his legs somewhat better. There is certainly less edema, less multiple open areas on the left calf and the right anterior leg looks somewhat better as well superficial and a little smaller. However he relates pain and erythema over the last 3-4 days in the thigh and I looked at this today. He has not been systemically unwell no fever no chills no change in blood sugar values 05/25/18; comes in today in a better state. The severe cellulitis on his left leg seems better with the Keflex. Not as tender. He has not been systemically unwell ooHard to find an open wound on the left lower leg using his compression pumps twice a day ooThe confluent wounds on his right lateral calf somewhat better looking. These will ultimately need debridement I didn't do this today. 06/01/18; the severe cellulitis on the left anterior thigh has resolved and he is completed his Keflex. ooThere is no open wound on the left leg however there is a superficial excoriation at the base of  the third toe dorsally. Skin on the bottom of his left foot is macerated looking. ooThe left the wounds on the lateral right leg actually looks some better although he did require debridement of the top half of this wound area with an open curet 06/09/18 on evaluation today patient appears to be doing poorly in regard to his right lower extremity in particular this appears to likely be infected he has very thick purulent discharge along with a bright green tent to the discharge. This makes me concerned about the possibility of pseudomonas. He's also having increased discomfort at this point on evaluation. Fortunately there does not appear to be any evidence of infection spreading to the other location at this time. 06/16/18 on evaluation today patient appears to actually be doing fairly well. His ulcer has actually diminished in size quite significantly at this point which is good news. Nonetheless he still does have some evidence of infection he did see infectious disease this morning before coming here for his appointment. I did review the results of their evaluation and their note today. They did actually have him discontinue the Cipro and initiate treatment with linezolid at this time. He is doing this for the next seven days and they recommended a follow-up in four months with them. He is the keep a log of the need for intermittent antibiotic therapy between now and when he falls back up with infectious disease. This will help them gaze what exactly they need to do to try and help them out. 06/23/18; the patient arrives today with no open wounds on the left leg and left third toe healed. He is been using his compression pumps twice a day. On the right lateral leg he still has a sizable wound but this is a lot better than last time I saw this. 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 Has  been using silver alginate under 4 layer compression 06/30/18; the only open wound he has  is on the right lateral leg and this looks healthy. No debridement is required. We have been using silver alginate. He does not have an open wound on the left leg. There is apparently some drainage from the dorsal proximal third toe on the left although I see no open wound here. 07/03/18 on evaluation today patient was actually here just for a nurse visit rapid change. However when he was here on Wednesday for his rat change due to having been healed on the left and then developing blisters we initiated the wrap again knowing that he would be back today for [place] to reevaluate and see were at. Unfortunately he has developed some cellulitis into the proximal portion of his right lower extremity even into the region of his thigh. 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 He was placed on one as what at that point. Nonetheless he is done with that and has been tolerating it well otherwise. Doxycycline which in the past really did not seem to be effective for him. Nonetheless I think the best option may be for [place] to definitely reinitiate the antibiotics for a longer period of time. 07/07/18; since I last saw this patient a week ago he has had a difficult time. At that point he did not have an open wound on his left leg. We transitioned him into juxta light stockings. He was apparently in the clinic the next day with blisters on the left lateral and left medial lower calf. He also had weeping edema fluid. He was put back into a compression wrap. He was also in the clinic on Friday with intense erythema in his right thigh. Per the patient he was started on Bactrim however that didn't work at all in terms of relieving his pain and swelling. He has taken 3 doxycycline that he had left over from last time and that seems to of helped. He has blistering on the right thigh  as well. 07/14/18; the erythema on his right thigh has gotten better with doxycycline that he is finishing. 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 Presumably this cellulitis in the thigh was not related to that although I think this is a bit concerning going forward. He still has an area on the right lateral calf the blister on the right medial calf just below the knee that was discussed above. On the left 2 small open areas left medial and left lateral. Edema control is adequate. He is using his compression pumps twice a day 07/20/18; continued improvement in the condition of both legs especially the edema in his bilateral thighs. He tells me he is been losing weight through a combination of diet and exercise. He is using his compression pumps twice a day. So overall she made to the remaining wounds 07/27/2018; continued improvement in condition of both legs. His edema is well controlled. The area on the right lateral leg is just about closed he had one blisters show up on the medial left upper calf. We have him in 4 layer compression. He is going on a 10-day trip to [place], [place] and [place]. He will be driving. He wants to wear Unna boots because of the lessening amount of constriction. He will not use compression pumps while he is away 08/05/18 on evaluation today patient actually appears to be doing decently well all things considered in regard  to his bilateral lower extremities. The worst ulcer is actually only posterior aspect of his left lower extremity with a four layer compression wrap cut into his leg a couple weeks back. He did have a trip and actually had [employer] for the trip that he is worn since he was last here. Nonetheless he feels like the [employer] actually do better for him his swelling is up a little bit but he also with his trip was not taking his Lasix on a regular set  schedule like he was supposed to be. He states that obviously the reason being that he cannot drive and keep going without having to urinate too frequently which makes it difficult. He did not have his pumps with him while he was away either which I think also maybe playing a role here too. 08/13/2018; the patient only has a small open wound on the right lateral calf which is a big improvement in the last month or 2. He also has the area posteriorly just below the posterior fossa on the left which I think was a wrap injury from several weeks ago. He has no current evidence of cellulitis. He tells me he is back into his compression pumps twice a day. He also tells me that while he was at the laundromat somebody stole a section of his extremitease stockings 08/20/2018; back in the clinic with a much improved state. He only has small areas on the right lateral mid calf which is just about healed. This was is more substantial area for quite a prolonged period of time. He has a small open area on the left anterior tibia. The area on the posterior calf just below the popliteal fossa is closed today. He is using his compression pumps twice a day 08/28/2018; patient has no open wound on the right leg. He has a smattering of open areas on the calf with some weeping lymphedema. More problematically than that it looks as though his wraps of slipped down in his usual he has very angry upper area of edema just below the right medial knee and on the right lateral calf. He has no open area on his feet. The patient is traveling to [facility] next week. I will send him in an antibiotic. We will continue to wrap the right leg. We ordered extremitease stockings for him last week and I plan to transition the right leg to a stocking when he gets home which will be in 10 days time. As usual he is very reluctant to take his pumps with him when he travels 09/07/2018; patient returns from [facility]. He shows me a  picture of his left leg in the mid part of his trip last week with intense fire engine erythema. The picture look bad enough I would have considered sending him to the hospital. Instead he went to the wound care center in [facility]. They did not prescribe him antibiotics but he did take some doxycycline he had leftover from a previous visit. I had given him trimethoprim sulfamethoxazole before he left this did not work according to the patient. This is resulted in some improvement fortunately. He comes back with a large wound on the left posterior calf. Smaller area on the left anterior tibia. Denuded blisters on the dorsal left foot over his toes. Does not have much in the way of wounds on the right leg although he does have a very tender area on the right posterior area just below  the popliteal fossa also suggestive of infection. He promises me he is back on his pumps twice a day 09/15/2018; the intense cellulitis in his left lower calf is a lot better. The wound area on the posterior left calf is also so better. However he has reasonably extensive wounds on the dorsal aspect of his second and third toes and the proximal foot just at the base of the toes. There is nothing open on the right leg 09/22/2018; the patient has excellent edema control in his legs bilaterally. He is using his external compression pumps twice a day. He has no open area on the right leg and only the areas in the left foot dorsally second and third toe area on the left side. He does not have any signs of active cellulitis. 10/06/2018; the patient has good edema control bilaterally. He has no open wound on the right leg. There is a blister in the posterior aspect of his left calf that we had to deal with today. He is using his compression pumps twice a day. There is no signs of active cellulitis. We have been using silver alginate to the wound areas. He still has vulnerable areas on the base of his left first  second toes dorsally He has a his extremities stockings and we are going to transition him today into the stocking on the right leg. He is cautioned that he will need to continue to use the compression pumps twice a day. If he notices uncontrolled edema in the right leg he may need to go to 3 times a day. 10/13/2018; the patient came in for a nurse check on Friday he has a large flaccid blister on the right medial calf just below the knee. We unroofed this. He has this and a new area underneath the posterior mid calf which was undoubtedly a blister as well. He also has several small areas on the right which is the area we put his extremities stocking on. 10/19/2018; the patient went to see infectious disease this morning I am not sure if that was a routine follow-up in any case the doxycycline I had given him was discontinued and started on linezolid. He has not started this. It is easy to look at his left calf and the inflammation and think this is cellulitis however he is very tender in the tissue just below the popliteal fossa and I have no doubt that there is infection going on here. He states the problem he is having is that with the compression pumps the edema goes down and then starts walking the wrap falls down. We will see if we can adhere this. He has 1 or 2 minuscule open areas on the right still areas that are weeping on the posterior left calf, the base of his left second and third toes 10/26/18; back today in clinic with quite of skin breakdown in his left anterior leg. This may have been infection the area below the popliteal fossa seems a lot better however tremendous epithelial loss on the left anterior mid tibia area over quite inexpensive tissue. He has 2 blisters on the right side but no other open wound here. 10/29/2018; came in urgently to see [place] today and we worked him in for review. He states that the 4 layer compression on the right leg caused pain he had to cut it down to  roughly his mid calf this caused swelling above the wrap and he has blisters and skin breakdown today. As a result of  the pain he has not been using his pumps. Both legs are a lot more edematous and there is a lot of weeping fluid. 11/02/18; arrives in clinic with continued difficulties in the right leg> left. Leg is swollen and painful. multiple skin blisters and new open areas especially laterally. He has not been using his pumps on the right leg. 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 He is not systemically unwell. 11/09/2018; the patient claims he is being compliant with his pumps. He is finished the doxycycline I gave him last week. Culture I did of the wound on the right lateral leg showed a few very resistant methicillin staph aureus. This was resistant to doxycycline. Nevertheless he states the pain in the leg is a lot better which makes me wonder if the cultured organism was not really what was causing the problem nevertheless this is a very dangerous organism to be culturing out of any wound. His right leg is still a lot larger than the left. He is using an Radio broadcast assistant on this area, he blames a 4-layer compression for causing the original skin breakdown which I doubt is true however I cannot talk him out of it. We have been using silver alginate to all of these areas which were initially blisters 11/16/2018; patient is being compliant with his external compression pumps at twice a day. Miraculously he arrives in clinic today with absolutely no open wounds. He has better edema control on the left where he has been using 4 layer compression versus wound of wounds on the right and I pointed this out to him. There is no inflammation in the skin in his lower legs which is also somewhat unusual for him. There is no open wounds on the dorsal left foot. He has extremitease stockings at home and I have asked him to bring these in next week. 11/25/18  patient's lower extremity on examination today on the left appears for the most part to be wound free. He does have an open wound on the lateral aspect of the right lower extremity but this is minimal compared to what I've seen in past. He does request that we go ahead and wrap the left leg as well even though there's nothing open just so hopefully it will not reopen in short order. 1/28; patient has superficial open wounds on the right lateral calf left anterior calf and left posterior calf. His edema control is adequate. 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 We have been using silver alginate as the primary dressing. He claims compliance with his compression pumps 2/4; patient has superficial open wounds on numerous areas of his left calf and again one on the left dorsal foot. The areas on the right lateral calf have healed. The cellulitis that I gave him doxycycline for last week is also resolved this was mostly on the left anterior calf just below the tibial tuberosity. His edema looks fairly well-controlled. He tells me he went to see his primary doctor today and had blood work ordered 2/11; once again he has several open areas on the left calf left tibial area. Most of these are small and appear to have healthy granulation. He does not have anything open on the right. The edema and control in his thighs is pretty good which is usually a good indication he has been using his pumps as requested. 2/18;  he continues to have several small areas on the left calf and left tibial area. Most of these are small healthy granulation. We put him in his stocking on the right leg last week and he arrives with a superficial open area over the right upper tibia and a fairly large area on the right lateral tibia in similar condition. His edema control actually does not look too bad, he claims to be using his compression  pumps twice a day 2/25. Continued small areas on the left calf and left tibial area. New areas especially on the right are identified just below the tibial tuberosity and on the right upper tibia itself. There are also areas of weeping edema fluid even without an obvious wound. He does not have a considerable degree of lymphedema but clearly there is more edema here than his skin can handle. He states he is using the pumps twice a day. We have an Unna boot on the right and 4 layer compression on the left. 3/3; he continues to have an area on the right lateral calf and right posterior calf just below the popliteal fossa. There is a fair amount of tenderness around the wound on the popliteal fossa but I did not see any evidence of cellulitis, could just be that the wrap came down and rubbed in this area. ooHe does not have an open area on the left leg however there is an area on the left dorsal foot at the base of the third toe ooWe have been using silver alginate to all wound areas 3/10; he did not have an open area on his left leg last time he was here a week ago. Today he arrives with a horizontal wound just below the tibial tuberosity and an area on the left lateral calf. He has intense erythema and tenderness in this area. The area is on the right lateral calf and right posterior calf better than last week. We have been using silver alginate as usual 3/18 - Patient returns with 3 small open areas on left calf, and 1 small open area on right calf, the skin looks ok with no significant erythema, he continues the UNA boot on right and 4 layer compression on left. The right lateral calf wound is closed , the right posterior is small area. we will continue silver alginate to the areas. Culture results from right posterior calf wound is + MRSA sensitive to Bactrim but resistant to DOXY 01/27/19 on evaluation today patient's bilateral lower extremities actually appear to be doing fairly well at this  point which is good news. He is been tolerating the dressing changes without complication. Fortunately she has made excellent improvement in regard to the overall status of his wounds. Unfortunately every time we cease wrapping him he ends up reopening in causing more significant issues at that point. Again I'm unsure of the best direction to take although I think the lymphedema clinic may be appropriate for him. 02/03/19 on evaluation today patient appears to be doing well in regard to the wounds that we saw him for last week unfortunately he has a new area on the proximal portion of his right medial/posterior lower extremity where the wrap somewhat slowed down and caused swelling and a blister to rub and open. Unfortunately this is the only opening that he has on either leg at this point. 02/17/19 on evaluation today patient's bilateral lower extremities appear to be doing well. He still completely healed in regard to the left lower extremity. In  regard to the right lower extremity the area where the wrap and slid down and caused the blister still seems to be slightly open although this is dramatically better than during the last evaluation two weeks ago. I'm very pleased with the way this stands overall. 03/03/19 on evaluation today patient appears to be doing well in regard to his right lower extremity in general although he did have a new blister open this does not appear to be showing any evidence of active infection at this time. Fortunately there's No fevers, chills, nausea, or vomiting noted at this time. Overall I feel like he is making good progress it does feel like that the right leg will we perform the [employer] seems to do with a bit better than three layer wrap on the left which slid down on him. We may switch to doing bilateral in the book wraps. 5/4; I have not seen Mr. Clardy in quite some time. According to our case manager he did not have an open wound on his left leg last week.  He had 1 remaining wound on the right posterior medial calf. He arrives today with multiple openings on the left leg probably were blisters and/or wrap injuries from Unna boots. I do not think the Unna boot's will provide adequate compression on the left. I am also not clear about the frequency he is using the compression pumps. 03/17/19 on evaluation today patient appears to be doing excellent in regard to his lower extremities compared to last week's evaluation apparently. He had gotten significantly worse last week which is unfortunate. The [employer] wrap on the left did not seem to do very well for him at all and in fact it didn't control his swelling significantly enough he had an additional outbreak. Subsequently we go back to the four layer compression wrap on the left. This is good news. At least in that he is doing better and the wound seem to be killing him. He still has not heard anything from the lymphedema clinic. 03/24/19 on evaluation today patient actually appears to be doing much better in regard to his bilateral lower Trinity as compared to last week when I saw him. Fortunately there's no signs of active infection at this time. He has been tolerating the dressing changes without complication. Overall I'm extremely pleased with the progress and appearance in general. 04/07/19 on evaluation today patient appears to be doing well in regard to his bilateral lower extremities. His swelling is significantly down from where it was previous. With that being said he does have a couple blisters still open at this point but fortunately nothing that seems to be too severe and again the majority of the larger openings has healed at this time. 04/14/19 on evaluation today patient actually appears to be doing quite well in regard to his bilateral lower extremities in fact I'm not even sure there's anything significantly open at this time at any site. Nonetheless he did have some trouble with these  wraps where they are somewhat irritating him secondary to the fact that he has noted that the graph wasn't too close down to the end of this foot in a little bit short as well up to his knee. Otherwise things seem to be doing quite well. 04/21/19 upon evaluation today patient's wound bed actually showed evidence of being completely healed in regard to both lower extremities which is excellent news. There does not appear to be any signs of active infection which is also good  news. I'm very pleased in this regard. No fevers, chills, nausea, or vomiting noted at this time. 04/28/19 on evaluation today patient appears to be doing a little bit worse in regard to both lower extremities on the left mainly due to the fact that when he went infection disease the wrap was not wrapped quite high enough he developed a blister above this. On the right he is a small open area of nothing too significant but again this is continuing to give him some trouble he has been were in the Velcro compression that he has at home. 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 He's been tolerating the dressing changes without complication. Fortunately there's no signs of active infection at this time. No fevers, chills, nausea, or vomiting noted at this time. We have been trying to get an appointment with her lymphedema clinic in [facility] but unfortunately nobody can get them on phone with not been able to even fax information over the patient likewise is not been able to get in touch with them. Overall I'm not sure exactly what's going on here with to reach out again today. 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 Still having a lot of drainage unfortunately. He tells me especially in the left but even on the right. There's no signs of active infection which is  good news we've been using so ratcheted up to this point. 05/19/19 on evaluation today patient actually appears to be doing quite well with regard to his left lower extremity which is great news. Fortunately in regard to the right lower extremity has an issues with his wrap and he subsequently did remove this from what I'm understanding. Nonetheless long story short is what he had rewrapped once he removed it subsequently had maggots underneath this wrap whenever he came in for evaluation today. With that being said they were obviously completely cleaned away by the nursing staff. The visit today which is excellent news. However he does appear to potentially have some infection around the right ankle region where the maggots were located as well. He will likely require anabiotic therapy today. 05/26/19 on evaluation today patient actually appears to be doing much better in regard to his bilateral lower extremities. I feel like the infection is under much better control. With that being said there were maggots noted when the wrap was removed yet again today. Again this could have potentially been left over from previous although at this time there does not appear to be any signs of significant drainage there was obviously on the wrap some drainage as well this contracted gnats or otherwise. Either way I do not see anything that appears to be doing worse in my pinion and in fact I think his drainage has slowed down quite significantly likely mainly due to the fact to his infection being under better control. 06/02/2019 on evaluation today patient actually appears to be doing well with regard to his bilateral lower extremities there is no signs of active infection at this time which is great news. With that being said he does have several open areas more so on the right than the left but nonetheless these are all significantly better than previously noted. 06/09/2019 on evaluation today patient actually  appears to be doing well. His wrap stayed up and he did not cause any problems he had more  drainage on the right compared to the left but overall I do not see any major issues at this time which is great news. 06/16/2019 on evaluation today patient appears to be doing excellent with regard to his lower extremities the only area that is open is a new blister that can have opened as of today on the medial ankle on the left. Other than this he really seems to be doing great I see no major issues at this point. 06/23/2019 on evaluation today patient appears to be doing quite well with regard to his bilateral lower extremities. In fact he actually appears to be almost completely healed there is a small area of weeping noted of the right lower extremity just above the ankle. Nonetheless fortunately there is no signs of active infection at this time which is good news. No fevers, chills, nausea, vomiting, or diarrhea. 8/24; the patient arrived for a nurse visit today but complained of very significant pain in the left leg and therefore I was asked to look at this. Noted that he did not have an open area on the left leg last week nevertheless this was wrapped. The patient states that he is not been able to put his compression pumps on the left leg because of the discomfort. He has not been systemically unwell 06/30/2019 on evaluation today patient unfortunately despite being excellent last week is doing much worse with regard to his left lower extremity today. In fact he had to come in for a nurse on Monday where his left leg had to be rewrapped due to excessive weeping Dr. Leanord Hawking placed him on doxycycline at that point. Fortunately there is no signs of active infection Systemically at this time which is good news. 07/07/2019 in regard to the patient's wounds today he actually seems to be doing well with his right lower extremity there really is nothing open or draining at this point this is great news.  Unfortunately the left lower extremity is given him additional trouble at this time. There does not appear to be any signs of active infection nonetheless he does have a lot of edema and swelling noted at this point as well as blistering all of which has led to a much more poor appearing leg at this time compared to where it was 2 weeks ago when it was almost completely healed. Obviously this is a little discouraging for the patient. He is try to contact the lymphedema clinic in [place] he has not been able to get through to them. 07/14/2019 on evaluation today patient actually appears to be doing slightly better with regard to his left lower extremity ulcers. Overall I do feel like at least at the top of the wrap that we have been placing this area has healed quite nicely and looks much better. The remainder of the leg is showing signs of improvement. Unfortunately in the thigh area he still has an open region on the left and again on the right he has been utilizing just a Band-Aid on an area that also opened on the thigh. Again this is an area that were not able to wrap although we did do an Ace wrap to provide some compression that something that obviously is a little less effective than the compression wraps we have been using on the lower portion of the leg. He does have an appointment with the lymphedema clinic in [facility] on Friday. 07/21/2019 on evaluation today patient appears to be doing better with regard to his lower extremity  ulcers. He has been tolerating the dressing changes without complication. Fortunately there is no signs of active infection at this time. No fevers, chills, nausea, vomiting, or diarrhea. I did receive the paperwork from the physical therapist at the lymphedema clinic in [place]. Subsequently I signed off on that this morning and sent that back to him for further progression with the treatment plan. 07/28/2019 on evaluation today patient appears to be  doing very well with regard to his right lower extremity where I do not see any open wounds at this point. Fortunately he is feeling great as far as that is concerned as well. In regard to the left lower extremity he has been having issues with still several areas of weeping and edema although the upper leg is doing better his lower leg still I think is going require the compression wrap at this time. No fevers, chills, nausea, vomiting, or diarrhea. 08/04/2019 on evaluation today patient unfortunately is having new wounds on the right lower extremity. Again we have been using Unna boot wrap on that side. We switched him to using his juxta lite wrap at home. With that being said he tells me he has been using it although his legs extremely swollen and to be honest really does not appear that he has been. I cannot know that for sure however. Nonetheless he has multiple new wounds on the right lower extremity at this time. Obviously we will have to see about getting this rewrapped for him today. 08/11/2019 on evaluation today patient appears to be doing fairly well with regard to his wounds. He has been tolerating the dressing changes including the compression wraps without complication. He still has a lot of edema in his upper thigh regions bilaterally he is supposed to be seeing the lymphedema clinic on the 15th of this month once his wraps arrive for the upper part of his legs. 08/18/2019 on evaluation today patient appears to be doing well with regard to his bilateral lower extremities at this point. He has been tolerating the dressing changes without complication. Fortunately there is no signs of active infection which is also good news. He does have a couple weeping areas on the first and second toe of the right foot he also has just a small area on the left foot upper leg and a small area on the left lower leg but overall he is doing quite well in my opinion. He is supposed to be getting his  wraps shortly in fact tomorrow and then subsequently is seeing the lymphedema clinic next Wednesday on the 21st. Of note he is also leaving on the 25th to go on vacation for a week to the beach. For that reason and since there is some uncertainty about what there can be doing at lymphedema clinic next Wednesday I am get a make an appointment for next Friday here for [place] to see what we need to do for him prior to him leaving for vacation. 10/23; patient arrives in considerable pain predominantly in the upper posterior calf just distal to the popliteal fossa also in the wound anteriorly above the major wound. This is probably cellulitis and he has had this recurrently in the past. He has no open wound on the right side and he has had an Radio broadcast assistant in that area. Finally I note that he has an area on the left posterior calf which by enlarge is mostly epithelialized. This protrudes beyond the borders of the surrounding skin in the setting of dry  scaly skin and lymphedema. The patient is leaving for [facility] on Sunday. Per his longstanding pattern, he will not take his compression pumps with him predominantly out of fear that they will be stolen. He therefore asked that we put a Unna boot back on the right leg. He will also contact the wound care center in [facility] to see if they can change his dressing in the mid week. 11/3; patient returned from his vacation to [facility]. He was seen on 1 occasion at their wound care center. They did a 2 layer compression system as they did not have our 4-layer wrap. I am not completely certain what they put on the wounds. They did not change the Unna boot on the right. The patient is also seeing a lymphedema specialist physical therapist in [place]. It appears that he has some compression sleeve for his thighs which indeed look quite a bit better than I am used to seeing. He pumps over these with his external compression pumps. 11/10; the patient has  a new wound on the right medial thigh otherwise there is no open areas on the right. He has an area on the left leg posteriorly anteriorly and medially and an area over the left second toe. We have been using silver alginate. He thinks the injury on his thigh is secondary to friction from the compression sleeve he has. 11/17; the patient has a new wound on the right medial thigh last week. He thinks this is because he did not have a underlying stocking for his thigh juxta lite apparatus. He now has this. The area is fairly large and somewhat angry but I do not think he has underlying cellulitis. ooHe has a intact blister on the right anterior tibial area. ooSmall wound on the right great toe dorsally ooSmall area on the medial left calf. 11/30; the patient does not have any open areas on his right leg and we did not take his juxta lite stocking off. However he states that on Friday his compression wrap fell down lodging around his upper mid calf area. As usual this creates a lot of problems for him. He called urgently today to be seen for a nurse visit however the nurse visit turned into a provider visit because of extreme erythema and pain in the left anterior tibia extending laterally and posteriorly. The area that is problematic is extensive 10/06/2019 upon evaluation today patient actually appears to be doing poorly in regard to his left lower extremity. He Dr. Leanord Hawking did place him on doxycycline this past Monday apparently due to the fact that he was doing much worse in regard to this left leg. Fortunately the doxycycline does seem to be helping. Unfortunately we are still having a very difficult time getting his edema under any type of control in order to anticipate discharge at some point. The only way were really able to control his lymphedema really is with compression wraps and that has only even seemingly temporary. He has been seeing a lymphedema clinic they are trying to help in  this regard but still this has been somewhat frustrating in general for the patient. 10/13/19 on evaluation today patient appears to be doing excellent with regard to his right lower extremity as far as the wounds are concerned. His swelling is still quite extensive unfortunately. He is still having a lot of drainage from the thigh areas bilaterally which is unfortunate. He's been going to lymphedema clinic but again he still really does not have  this edema under control as far as his lower extremities are concern. With regard to his left lower extremity this seems to be improving and I do believe the doxycycline has been of benefit for him. He is about to complete the doxycycline. 10/20/2019 on evaluation today patient appears to be doing poorly in regard to his bilateral lower extremities. More in the right thigh he has a lot of irritation at this site unfortunately. In regard to the left lower extremity the wrap was not quite as high it appears and does seem to have caused him some trouble as well. Fortunately there is no evidence of systemic infection though he does have some blue-green drainage which has me concerned for the possibility of Pseudomonas. He tells me he is previously taking Cipro without complications and he really does not care for Levaquin however due to some of the side effects he has. He is not allergic to any medications specifically antibiotics that were aware of. 10/27/2019 on evaluation today patient actually does appear to be for the most part doing better when compared to last week's evaluation. With that being said he still has multiple open wounds over the bilateral lower extremities. He actually forgot to start taking the Cipro and states that he still has the whole bottle. He does have several new blisters on left lower extremity today I think I would recommend he go ahead and take the Cipro based on what I am seeing at this point. 12/30-Patient comes at 1 week  visit, 4 layer compression wraps on the left and Unna boot on the right, primary dressing Xtrasorb and silver alginate. Patient is taking his Cipro and has a few more days left probably 5-6, and the legs are doing better. He states he is using his compressions devices which I believe he has 11/10/2019 on evaluation today patient actually appears to be much better than last time I saw him 2 weeks ago. His wounds are significantly improved and overall I am very pleased in this regard. Fortunately there is no signs of active infection at this time. He is just a couple of days away from completing Cipro. Overall his edema is much better he has been using his lymphedema pumps which I think is also helping at this point. 11/17/2019 on evaluation today patient appears to be doing excellent in regard to his wounds in general. His legs are swollen but not nearly as much as they have been in the past. Fortunately he is tolerating the compression wraps without complication. No fevers, chills, nausea, vomiting, or diarrhea. He does have some erythema however in the distal portion of his right lower extremity specifically around the forefoot and toes there is a little bit of warmth here as well. 11/24/2019 on evaluation today patient appears to be doing well with regard to his right lower extremity I really do not see any open wounds at this point. His left lower extremity does have several open areas and his right medial thigh also is open. Other than this however overall the patient seems to be making good progress and I am very pleased at this point. 12/01/2019 on evaluation today patient appears to be doing poorly at this point in regard to his left lower extremity has several new blisters despite the fact that we have him in compression wraps. In fact he had a 4-layer compression wrap, his upper thigh wrapped from lymphedema clinic, and a juxta light over top of the 4 layer compression wrap the lymphedema  clinic applied and despite all this he still develop blisters underneath. Obviously this does have me concerned about the fact that unfortunately despite what we are doing to try to get wounds healed he continues to have new areas arise I do not think he is ever good to be at the point where he can realistically just use wraps at home to keep things under control. Typically when we heal him it takes about 1-2 days before he is back in the clinic with severe breakdown and blistering of his lower extremities bilaterally. This is happened numerous times in the past. Unfortunately I think that we may need some help as far as overall fluid overload to kind of limit what we are seeing and get things under better control. 12/08/2019 on evaluation today patient presents for follow-up concerning his ongoing bilateral lower extremity edema. Unfortunately he is still having quite a bit of swelling the compression wraps are controlling this to some degree but he did see Dr. [name] his cardiologist I do have that available for review today as far as the appointment was concerned that was on 12/06/2019. Obviously that she has been 2 days ago. The patient states that he is only been taking the Lasix 80 mg 1 time a day he had told me previously he was taking this twice a day. Nonetheless Dr. [name] recommended this be up to 80 mg 2 times a day for the patient as he did appear to be fluid overloaded. With that being said the patient states he did this yesterday and he was unable to go anywhere or do anything due to the fact that he was constantly having to urinate. Nonetheless I think that this is still good to be something that is important for him as far as trying to get his edema under control at all things that he is going to be able to just expect his wounds to get under control and things to be better without going through at least a period of time where he is trying to stabilize his fluid management in general and  I think increasing the Lasix is likely the first step here. It was also mentioned the possibility that the patient may require metolazone. With that being said he wanted to have the patient take Lasix twice a day first and then reevaluating 2 months to see where things stand. 12/15/2019 upon evaluation today patient appears to be doing regard to his legs although his toes are showing some signs of weeping especially on the left at this point to some degree on the right. There does not appear to be any signs of active infection and overall I do feel like the compression wraps are doing well for him but he has not been able to take the Lasix at home and the increased dose that Dr. [name] recommended. He tells me that just not go to be feasible for him. Nonetheless I think in this case he should probably send a message to Dr. [name] in order to discuss options from the standpoint of possible admission to get the fluid off or otherwise going forward. Objective Constitutional Obese and well-hydrated in no acute distress. Vitals Time Taken: 10:34 AM, Height: 70 in, Weight: 380.2 lbs, BMI: 54.5, Temperature: 97.9 F, Pulse: 62 bpm, Respiratory Rate: 20 breaths/min, Blood Pressure: 127/64 mmHg, Capillary Blood Glucose: 126 mg/dl. Respiratory normal breathing without difficulty. Psychiatric this patient is able to make decisions and demonstrates good insight into disease process. Alert and Oriented x 3.  pleasant and cooperative. General Notes: Patient's wound bed currently on the legs all appears to be healed except for on the right inner thigh where he does still have an open wound at this location he still using his lymphedema wraps.. That has subsequently led to new small openings with regard to his toes he has several areas that are just somewhat open and weeping due to the lymphedema and again the fact that we really cannot wrap the toes with the compression wrap at this point which I think is going  to continue to be the issue until he has a much better control over his fluid load in general. Integumentary (Hair, Skin) Wound #145R status is Open. Original cause of wound was Gradually Appeared. The wound is located on the Right,Medial Upper Leg. The wound measures 6cm length x 2.5cm width x 0.1cm depth; 11.781cm^2 area and 1.178cm^3 volume. There is no tunneling or undermining noted. There is a medium amount of serosanguineous drainage noted. The wound margin is flat and intact. There is large (67-100%) red granulation within the wound bed. There is no necrotic tissue within the wound bed. Wound #156 status is Open. Original cause of wound was Blister. The wound is located on the Left,Dorsal Foot. The wound measures 0.6cm length x 0.7cm width x 0.1cm depth; 0.33cm^2 area and 0.033cm^3 volume. There is no tunneling or undermining noted. There is a medium amount of serosanguineous drainage noted. The wound margin is distinct with the outline attached to the wound base. There is large (67-100%) red, pink granulation within the wound bed. There is a small (1-33%) amount of necrotic tissue within the wound bed including Adherent Slough. 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 There was a Four Layer Compression Therapy Procedure by Yevonne Pax, RN. Post procedure Diagnosis Wound #156: Same as Pre-Procedure There was a Radio broadcast assistant Compression Therapy  Procedure by Yevonne Pax, RN. Post procedure Diagnosis Wound #: Same as Pre-Procedure Plan Follow-up Appointments: Return Appointment in 1 week. Dressing Change Frequency: Do not change entire dressing for one week. - both lower legs Skin Barriers/Peri-Wound Care: Moisturizing lotion TCA Cream or Ointment - to legs mixed with lotion Wound Cleansing: May shower with protection. Primary Wound Dressing: Wound #145R Right,Medial Upper Leg: Calcium Alginate with Silver Wound #156 Left,Dorsal Foot: Calcium Alginate with Silver Secondary Dressing: Wound #145R Right,Medial Upper Leg: ABD pad - secured with tape Wound #156 Left,Dorsal Foot: Dry Gauze Edema Control: 4 layer compression: Left lower extremity - unna boot at upper portion of lower leg. pad bend of foot with foam Unna Boot to Right Lower Extremity Avoid standing for long periods of time Elevate legs to the level of the heart or above for 30 minutes daily and/or when sitting, a frequency of: - throughout the day Exercise regularly Segmental Compressive Device. - lymphadema pumps 60 min 2 times per day Other: - lymphadema wraps to upper leg per [facility] clinic 1. My suggestion currently is can be that we going continue with the current wound care measures for the next week this  is good include the compression wraps as we have done before per above. Were also getting you some silver alginate to the toes in order to help with weeping and edema at these locations. This is on the left especially though to some degree on the right. 2. The patient really needs to be using his lymphedema pumps 2 times a day an hour each time. 3. He also really needs to be taking increased Lasix dosage but he states he cannot do this at home and have any type of life. For that reason I did recommend he contact Dr. [name] about the possibility of admission to the hospital to try to get the fluid off. We will see patient back for reevaluation in 1  week here in the clinic. If anything worsens or changes patient will contact our office for additional recommendations. Electronic Signature(s) Signed: 12/15/2019 11:12:37 AM By: Lenda Kelp PA-C Entered By: Lenda Kelp on 12/15/2019 11:12:37 -------------------------------------------------------------------------------- SuperBill Details Patient Name: Date of Service: KEELEN, QUEVEDO 12/15/2019 Medical Record [id number] Patient Account Number: [id number] Date of Birth/Sex: Treating RN: [birth year] (69 y.o. 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, unspecified L03.116 Cellulitis of left lower limb Facility Procedures CPT4 Code Description: [id number] (Facility Use Only) [phone] - APPLY UNNA BOOT RT Modifier: Quantity: 1 CPT4 Code Description: [id number] (Facility Use Only) [id number] - APPLY MULTLAY COMPRS LWR LT LEG Modifier: 59 Quantity: 1 Physician Procedures CPT4: Description Modifier Quantity Code [id number] - WC PHYS LEVEL 3 - EST PT 1 ICD-10 Diagnosis Description 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 skin L97.221 Non-pressure chronic ulcer of left calf limited to  breakdown of skin 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

## 2019-12-16 ENCOUNTER — Ambulatory Visit: Payer: Medicare Other

## 2019-12-16 DIAGNOSIS — I890 Lymphedema, not elsewhere classified: Secondary | ICD-10-CM | POA: Diagnosis not present

## 2019-12-16 DIAGNOSIS — R29898 Other symptoms and signs involving the musculoskeletal system: Secondary | ICD-10-CM | POA: Diagnosis not present

## 2019-12-20 NOTE — Progress Notes (Signed)
Agree with penicillin injection in office today. Signed:  Santiago Bumpers, MD           12/20/2019

## 2019-12-22 ENCOUNTER — Other Ambulatory Visit: Payer: Self-pay

## 2019-12-22 ENCOUNTER — Encounter (HOSPITAL_BASED_OUTPATIENT_CLINIC_OR_DEPARTMENT_OTHER): Payer: Medicare Other | Admitting: Physician Assistant

## 2019-12-22 DIAGNOSIS — E11621 Type 2 diabetes mellitus with foot ulcer: Secondary | ICD-10-CM | POA: Diagnosis not present

## 2019-12-22 DIAGNOSIS — I87333 Chronic venous hypertension (idiopathic) with ulcer and inflammation of bilateral lower extremity: Secondary | ICD-10-CM | POA: Diagnosis not present

## 2019-12-22 DIAGNOSIS — I890 Lymphedema, not elsewhere classified: Secondary | ICD-10-CM | POA: Diagnosis not present

## 2019-12-22 DIAGNOSIS — S71101A Unspecified open wound, right thigh, initial encounter: Secondary | ICD-10-CM | POA: Diagnosis not present

## 2019-12-22 DIAGNOSIS — L97529 Non-pressure chronic ulcer of other part of left foot with unspecified severity: Secondary | ICD-10-CM | POA: Diagnosis not present

## 2019-12-22 DIAGNOSIS — I10 Essential (primary) hypertension: Secondary | ICD-10-CM | POA: Diagnosis not present

## 2019-12-22 DIAGNOSIS — M109 Gout, unspecified: Secondary | ICD-10-CM | POA: Diagnosis not present

## 2019-12-22 DIAGNOSIS — I482 Chronic atrial fibrillation, unspecified: Secondary | ICD-10-CM | POA: Diagnosis not present

## 2019-12-22 DIAGNOSIS — E11622 Type 2 diabetes mellitus with other skin ulcer: Secondary | ICD-10-CM | POA: Diagnosis not present

## 2019-12-22 DIAGNOSIS — L03116 Cellulitis of left lower limb: Secondary | ICD-10-CM | POA: Diagnosis not present

## 2019-12-22 DIAGNOSIS — L97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 right calf limited to breakdown of skin: Secondary | ICD-10-CM | POA: Diagnosis not present

## 2019-12-22 DIAGNOSIS — I87312 Chronic venous hypertension (idiopathic) with ulcer of left lower extremity: Secondary | ICD-10-CM | POA: Diagnosis not present

## 2019-12-22 DIAGNOSIS — E1140 Type 2 diabetes mellitus with diabetic neuropathy, unspecified: Secondary | ICD-10-CM | POA: Diagnosis not present

## 2019-12-22 DIAGNOSIS — L97221 Non-pressure chronic ulcer of left calf limited to breakdown of skin: Secondary | ICD-10-CM | POA: Diagnosis not present

## 2019-12-22 DIAGNOSIS — S91102A Unspecified open wound of left great toe without damage to nail, initial encounter: Secondary | ICD-10-CM | POA: Diagnosis not present

## 2019-12-22 NOTE — Progress Notes (Addendum)
Kevin, [name] ([id number]) Visit Report for 12/22/2019 Chief Complaint Document Details Patient Name: Date of Service: [name], [name] 12/22/2019 10:15 AM Medical Record [id number] Patient Account Number: [id number] Date of Birth/Sex: Treating RN: 07-26-1951 (69 y.o. [name] Primary Care Provider: Nicoletta [name] Other Clinician: Referring Provider: Treating Provider/Extender:Stone [name], Kevin Cedar, [name] in Treatment: 204 Information Obtained from: Patient Chief Complaint patient is here for evaluation venous/lymphedema weeping Electronic Signature(s) Signed: 12/22/2019 10:33:52 AM By: Lenda Kelp PA-C Entered By: Lenda Kelp on 12/22/2019 10:33:52 -------------------------------------------------------------------------------- HPI Details Patient Name: Date of Service: Kevin, [name] 12/22/2019 10:15 AM Medical Record [id number] Patient Account Number: [id number] Date of Birth/Sex: Treating RN: 16-Oct-1951 (69 y.o. [name] Primary Care Provider: Nicoletta [name] Other Clinician: Referring Provider: Treating Provider/Extender:Stone [name], Kevin Cedar, [name] in Treatment: 204 History of Present Illness HPI Description: Referred by PCP for consultation. Patient has long standing history of BLE venous stasis, no prior ulcerations. At beginning of month, developed cellulitis and weeping. Received IM Rocephin followed by Keflex and resolved. Wears compression stocking, appr 6 months old. Not sure strength. No present drainage. 01/22/16 this is a patient who is a type II diabetic on insulin. 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 He tells me he religiously wears pressure stockings of uncertain strength. He was here with weeping edema about 8 months ago but did not have an open wound. Roughly a month ago he had a reopening on his bilateral legs. He is been using bandages and Neosporin. He does not  complain of pain. 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 Last BUN/creatinine I have is from 11/20/15 at 13 and 1.0 respectively 01/29/16; patient arrives today having tolerated the Profore wrap. He brought in his stockings and these are 18 mmHg stockings he bought from [employer]. The compression here is likely inadequate. He does not complain of pain or excessive drainage she has no systemic symptoms. The wound on the right looks improved as does the one on the left although one on the left is more substantial with still tissue at risk below the actual wound area on the bilateral posterior calf 02/05/16; patient arrives with poor edema control. He states that we did put a 4 layer compression on it last week. No weight appear 5 this. 02/12/16; the area on the posterior right Has healed. The left Has a substantial wound that has necrotic surface eschar that requires a debridement with a curette. 02/16/16;the patient called or a Nurse visit secondary to increased swelling. He had been in earlier in the week with his right leg healed. He was transitioned to is on pressure stocking on the right leg with the only open wound on the left, a substantial area on the left posterior calf. Note he has a history of severe lower extremity edema, he has a history of chronic atrial fibrillation but not heart failure per my notes but I'll need to research this. He is not complaining of chest pain shortness of breath or orthopnea. The intake nurse noted blisters on the previously closed right leg 02/19/16; this is the patient's regular visit day. I see him on Friday with escalating edema new wounds on the right leg and clear signs of at least right ventricular heart failure. I increased his Lasix to 40 twice a day. He is returning currently in follow-up. States he  is noticed a decrease in that the edema 02/26/16  patient's legs have much less edema. There is nothing really open on the right leg. The left leg has improved condition of the large superficial wound on the posterior left leg 03/04/16; edema control is very much better. The patient's right leg wounds have healed. On the left leg he continues to have severe venous inflammation on the posterior aspect of the left leg. There is no tenderness and I don't think any of this is cellulitis. 03/11/16; patient's right leg is married healed and he is in his own stocking. The patient's left leg has deteriorated somewhat. There is a lot of erythema around the wound on the posterior left leg. There is also a significant rim of erythema posteriorly just above where the wrap would've ended there is a new wound in this location and a lot of tenderness. Can't rule out cellulitis in this area. 03/15/16; patient's right leg remains healed and he is in his own stocking. The patient's left leg is much better than last review. His major wound on the posterior aspect of his left Is almost fully epithelialized. He has 3 small injuries from the wraps. Really. Erythema seems a lot better on antibiotics 03/18/16; right leg remains healed and he is in his own stocking. The patient's left leg is much better. The area on the posterior aspect of the left calf is fully epithelialized. His 3 small injuries which were wrap injuries on the left are improved only one seems still open his erythema has resolved 03/25/16; patient's right leg remains healed and he is in his own stocking. There is no open area today on the left leg posterior leg is completely closed up. His wrap injuries at the superior aspect of his leg are also resolved. He looks as though he has some irritation on the dorsal ankle but this is fully epithelialized without evidence of infection. 03/28/16; we discharged this patient on Monday. Transitioned him into his own stocking. There were problems almost immediately with  uncontrolled swelling weeping edema multiple some of which have opened. He does not feel systemically unwell in particular no chest pain no shortness of breath and he does not feel 04/08/16; the edema is under better control with the Profore light wrap but he still has pitting edema. There is one large wound anteriorly 2 on the medial aspect of his left leg and 3 small areas on the superior posterior calf. Drainage is not excessive he is tolerating a Profore light well 04/15/16; put a Profore wrap on him last week. This is controlled is edema however he had a lot of pain on his left anterior foot most of his wounds are healed 04/22/16 once again the patient has denuded areas on the left anterior foot which he states are because his wrap slips up word. He saw his primary physician today is on Lasix 40 twice a day and states that he his weight is down 20 pounds over the last 3 months. 04/29/16: Much improved. left anterior foot much improved. He is now on Lasix 80 mg per day. Much improved edema control 05/06/16; I was hoping to be able to discharge him today however once again he has blisters at a low level of where the compression was placed last week mostly on his left lateral but also his left medial leg and a small area on the anterior part of the left foot. 05/09/16; apparently the patient went home after his appointment on 7/4 later in  the evening developing pain in his upper medial thigh together with subjective fever and chills although his temperature was not taken. The pain was so intense he felt he would probably have to call 911. However he then remembered that he had leftover doxycycline from a previous round of antibiotics and took these. By the next morning he felt a lot better. He called and spoke to one of our nurses and I approved doxycycline over the phone thinking that this was in relation to the wounds we had previously seen although they were definitely were not. The patient feels a  lot better old fever no chills he is still working. Blood sugars are reasonably controlled 05/13/16; patient is back in for review of his cellulitis on his anterior medial upper thigh. He is taking doxycycline this is a lot better. Culture I did of the nodular area on the dorsal aspect of his foot grew MRSA this also looks a lot better. 05/20/16; the patient is cellulitis on the medial upper thigh has resolved. All of his wound areas including the left anterior foot, areas on the medial aspect of the left calf and the lateral aspect of the calf at all resolved. He has a new blister on the left dorsal foot at the level of the fourth toe this was excised. No evidence of infection 05/27/16; patient continues to complain weeping edema. He has new blisterlike wounds on the left anterior lateral and posterior lateral calf at the top of his wrap levels. The area on his left anterior foot appears better. He is not complaining of fever, pain or pruritus in his feet. 05/30/16; the patient's blisters on his left anterior leg posterior calf all look improved. He did not increase the Lasix 100 mg as I suggested because he was going to run out of his 40 mg tablets. He is still having weeping edema of his toes 06/03/16; I renewed his Lasix at 80 mg once a day as he was about to run out when I last saw him. He is on 80 mg of Lasix now. I have asked him to cut down on the excessive amount of water he was drinking and asked him to drink according to his thirst mechanisms 06/12/2016 -- was seen 2 days ago and was supposed to wear his compression stockings at home but he is developed lymphedema and superficial blisters on the left lower extremity and hence came in for a review 06/24/16; the remaining wound is on his left anterior leg. He still has edema coming from between his toes. There is lymphedema here however his edema is generally better than when I last saw this. He has a history of atrial fibrillation but does  not have a known history of congestive heart failure nevertheless I think he probably has this at least on a diastolic basis. 07/01/16 I reviewed his echocardiogram from January 2017. This was essentially normal. He did not have LVH, EF of 55-60%. His right ventricular function was normal although he did have trivial tricuspid and pulmonic regurgitation. This is not audible on exam however. I increased his Lasix to do massive edema in his legs well above his knees I think in early July. He was also drinking an excessive amount of water at the time. 07/15/16; missed his appointment last week because of the Labor Day holiday on Monday. He could not get another appointment later in the week. Started to feel the wrap digging in superiorly so we remove the top half and the bottom  half of his wrap. He has extensive erythema and blistering superiorly in the left leg. Very tender. Very swollen. Edema in his foot with leaking edema fluid. He has not been systemically unwell 07/22/16; the area on the left leg laterally required some debridement. The medial wounds look more stable. His wrap injury wounds appear to have healed. Edema and his foot is better, weeping edema is also better. He tells me he is meeting with the supplier of the external compression pumps at work 08/05/16; the patient was on vacation last week in [facility]. His wrap is been on for an extended period of time. Also over the weekend he developed an extensive area of tender erythema across his anterior medial thigh. He took to doxycycline yesterday that he had leftover from a previous prescription. The patient complains of weeping edema coming out of his toes 08/08/16; I saw this patient on 10/2. He was tender across his anterior thigh. I put him on doxycycline. He returns today in follow-up. He does not have any open wounds on his lower leg, he still has edema weeping into his toes. 08/12/16; patient was seen back urgently today to  follow-up for his extensive left thigh cellulitis/erysipelas. He comes back with a lot less swelling and erythema pain is much better. I believe I gave him Augmentin and Cipro. His wrap was cut down as he stated a roll down his legs. He developed blistering above the level of the wrap that remained. He has 2 open blisters and 1 intact. 08/19/16; patient is been doing his primary doctor who is increased his Lasix from 40-80 once a day or 80 already has less edema. Cellulitis has remained improved in the left thigh. 2 open areas on the posterior left calf 08/26/16; he returns today having new open blisters on the anterior part of his left leg. He has his compression pumps but is not yet been shown how to use some vital representative from the supplier. 09/02/16 patient returns today with no open wounds on the left leg. Some maceration in his plantar toes 09/10/2016 -- Dr. Leanord Hawking had recently discharged him on 09/02/2016 and he has come right back with redness swelling and some open ulcers on his left lower extremity. He says this was caused by trying to apply his compression stockings and he's been unable to use this and has not been able to use his lymphedema pumps. He had some doxycycline leftover and he has started on this a few days ago. 09/16/16; there are no open wounds on his leg on the left and no evidence of cellulitis. 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 He does not complain of symptoms here. I am not clear use using his external compression pumps. 09/23/16; I have not seen this patient in 2 [name]. He canceled his appointment 10 days ago as he was going on vacation. He tells me that on Monday he noticed a large area on his posterior left leg which is been draining copiously and is reopened into a large wound. He is been using ABDs and the external part of his juxtalite, according to our nurse this was not on  properly. 10/07/16; Still a substantial area on the posterior left leg. Using silver alginate 10/14/16; in general better although there is still open area which looks healthy. Still using silver alginate. He reminds me that this happen before he left for [facility]. Today while he was showering in the morning.  He had been using his juxtalite's 10/21/16; the area on his posterior left leg is fully epithelialized. However he arrives today with a large area of tender erythema in his medial and posterior left thigh just above the knee. I have marked the area. Once again he is reluctant to consider hospitalization. I treated him with oral antibiotics in the past for a similar situation with resolution I think with doxycycline however this area it seems more extensive to me. He is not complaining of fever but does have chills and says states he is thirsty. His blood sugar today was in the 140s at home 10/25/16 the area on his posterior left leg is fully epithelialized although there is still some weeping edema. The large area of tenderness and erythema in his medial and posterior left thigh is a lot less tender although there is still a lot of swelling in this thigh. He states he feels a lot better. He is on doxycycline and Augmentin that I started last week. This will continued until Tuesday, December 26. I have ordered a duplex ultrasound of the left thigh rule out DVT whether there is an abscess something that would need to be drained I would also like to know. 11/01/16; he still has weeping edema from a not fully epithelialized area on his left posterior calf. Most of the rest of this looks a lot better. He has completed his antibiotics. His thigh is a lot better. Duplex ultrasound did not show a DVT in the thigh 11/08/16; he comes in today with more Denuded surface epithelium from the posterior aspect of his calf. There is no real evidence of cellulitis. The superior aspect of his wrap appears to  have put quite an indentation in his leg just below the knee and this may have contributed. He does not complain of pain or fever. We have been using silver alginate as the primary dressing. The area of cellulitis in the right thigh has totally resolved. He has been using his compression stockings once a week 11/15/16; the patient arrives today with more loss of epithelium from the posterior aspect of his left calf. He now has a fairly substantial wound in this area. The reason behind this deterioration isn't exactly clear although his edema is not well controlled. He states he feels he is generally more swollen systemically. He is not complaining of chest pain shortness of breath fever. Tells me he has an appointment with his primary physician in early February. He is on 80 mg of oral Lasix a day. He claims compliance with the external compression pumps. He is not having any pain in his legs similar to what he has with his recurrent cellulitis 11/22/16; the patient arrives a follow-up of his large area on his left lateral calf. This looks somewhat better today. He came in earlier in the week for a dressing change since I saw him a week ago. He is not complaining of any pain no shortness of breath no chest pain 11/28/16; the patient arrives for follow-up of his large area on the left lateral calf this does not look better. In fact it is larger weeping edema. The surface of the wound does not look too bad. We have been using silver alginate although I'm not certain that this is a dressing issue. 12/05/16; again the patient follows up for a large wound on the left lateral and left posterior calf this does not look better. There continues to be weeping edema necrotic surface tissue. More worrisome than  this once again there is erythema below the wound involving the distal Achilles and heel suggestive of cellulitis. He is on his feet working most of the day of this is not going well. We are changing his  dressing twice a week to facilitate the drainage. 12/12/16; not much change in the overall dimensions of the large area on the left posterior calf. This is very inflamed looking. I gave him an. Doxycycline last week does not really seem to have helped. He found the wrap very painful indeed it seems to of dog into his legs superiorly and perhaps around the heel. He came in early today because the drainage had soaked through his dressings. 12/19/16- patient arrives for follow-up evaluation of his left lower extremity ulcers. He states that he is using his lymphedema pumps once daily when there is "no drainage". He admits to not using his lipedema pumps while under current treatment. His blood sugars have been consistently between 150-200. 12/26/16; the patient is not using his compression pumps at home because of the wetness on his feet. I've advised him that I think it's important for him to use this daily. He finds his feet too wet, he can put a plastic bag over his legs while he is in the pumps. Otherwise I think will be in a vicious circle. We are using silver alginate to the major area on his left posterior calf 01/02/17; the patient's posterior left leg has further of all into 3 open wounds. All of them covered with a necrotic surface. He claims to be using his compression pumps once a day. His edema control is marginal. Continue with silver alginate 01/10/17; the patient's left posterior leg actually looks somewhat better. There is less edema, less erythema. Still has 3 open areas covered with a necrotic surface requiring debridement. He claims to be using his compression pumps once a day his edema control is better 01/17/17; the patient's left posterior calf look better last week when I saw him and his wrap was changed 2 days ago. He has noted increasing pain in the left heel and arrives today with much larger wounds extensive erythema extending down into the entire heel area especially tender  medially. He is not systemically unwell CBGs have been controlled no fever. Our intake nurse showed me limegreen drainage on his AVD pads. 01/24/17; his usual this patient responds nicely to antibiotics last week giving him Levaquin for presumed Pseudomonas. The whole entire posterior part of his leg is much better much less inflamed and in the case of his Achilles heel area much less tender. He has also had some epithelialization posteriorly there are still open areas here and still draining but overall considerably better 01/31/17- He has continue to tolerate the compression wraps. he states that he continues to use the lymphedema pumps daily, and can increase to twice daily on the weekends. He is voicing no complaints or concerns regarding his LLE ulcers 02/07/17-he is here for follow-up evaluation. He states that he noted some erythema to the left medial and anterior thigh, which he states is new as of yesterday. He is concerned about recurrent cellulitis. He states his blood sugars have been slightly elevated, this morning in the 180s 02/14/17; he is here for follow-up evaluation. When he was last here there was erythema superiorly from his posterior wound in his anterior thigh. He was prescribed Levaquin however a culture of the wound surface grew MRSA over the phone I changed him to doxycycline on Monday and things  seem to be a lot better. 02/24/17; patient missed his appointment on Friday therefore we changed his nurse visit into a physician visit today. Still using silver alginate on the large area of the posterior left thigh. He isn't new area on the dorsal left second toe 03/03/17; actually better today although he admits he has not used his external compression pumps in the last 2 days or so because of work responsibilities over the weekend. 03/10/17; continued improvement. External compression pumps once a day almost all of his wounds have closed on the posterior left calf. Better edema  control 03/17/17; in general improved. He still has 3 small open areas on the lateral aspect of his left leg however most of the area on the posterior part of his leg is epithelialized. He has better edema control. He has an ABD pad under his stocking on the right anterior lower leg although he did not let us look at that today. 03/24/17; patient arrives back in clinic today with no open areas however there are areas on the posterior left calf and anterior left calf that are less than 100% epithelialized. His edema is well controlled in the left lower leg. There is some pitting edema probably lymphedema in the left upper thigh. He uses compression pumps at home once per day. I tried to get him to do this twice a day although he is very reticent. 04/01/2017 -- for the last 2 days he's had significant redness, tenderness and weeping and came in for an urgent visit today. 04/07/17; patient still has 6 more days of doxycycline. He was seen by Dr. Con Memos last Wednesday for cellulitis involving the posterior aspect, lateral aspect of his Involving his heel. For the most part he is better there is less erythema and less weeping. He has been on his feet for 12 hours 2 over the weekend. Using his compression pumps once a day 04/14/17 arrives today with continued improvement. Only one area on the posterior left calf that is not fully epithelialized. He has intense bilateral venous inflammation associated with his chronic venous insufficiency disease and secondary lymphedema. We have been using silver alginate to the left posterior calf wound In passing he tells [place] today that the right leg but we have not seen in quite some time has an open area on it but he doesn't want [place] to look at this today states he will show this to [place] next week. 04/21/17; there is no open area on his left leg although he still reports some weeping edema. He showed [place] his right leg today which is the first time we've seen this leg in a  long time. He has a large area of open wound on the right leg anteriorly healthy granulation. Quite a bit of swelling in the right leg and some degree of venous inflammation. He told us about the right leg in passing last week but states that deterioration in the right leg really only happened over the weekend 04/28/17; there is no open area on the left leg although there is an irritated part on the posterior which is like a wrap injury. The wound on the right leg which was new from last week at least to [place] is a lot better. 05/05/17; still no open area on the left leg. Patient is using his new compression stocking which seems to be doing a good job of controlling the edema. He states he is using his compression pumps once per day. The right leg still has an  open wound although it is better in terms of surface area. Required debridement. A lot of pain in the posterior right Achilles marked tenderness. Usually this type of presentation this patient gives concern for an active cellulitis 05/12/17; patient arrives today with his major wound from last week on the right lateral leg somewhat better. Still requiring debridement. He was using his compression stocking on the left leg however that is reopened with superficial wounds anteriorly he did not have an open wound on this leg previously. He is still using his juxta light's once daily at night. He cannot find the time to do this in the morning as he has to be at work by 7 AM 05/19/17; right lateral leg wound looks improved. No debridement required. The concerning area is on the left posterior leg which appears to almost have a subcutaneous hemorrhagic component to it. We've been using silver alginate to all the wounds 05/26/17; the right lateral leg wound continues to look improved. However the area on the left posterior calf is a tightly adherent surface. [place] using silver alginate. Because of the weeping edema in his legs there is very little  good alternatives. 06/02/17; the patient left here last week looking quite good. Major wound on the left posterior calf and a small one on the right lateral calf. Both of these look satisfactory. He tells me that by Wednesday he had noted increased pain in the left leg and drainage. He called on Thursday and Friday to get an appointment here but we were blocked. He did not go to urgent care or his primary physician. He thinks he had a fever on Thursday but did not actually take his temperature. He has not been using his compression pumps on the left leg because of pain. I advised him to go to the emergency room today for IV antibiotics for stents of left leg cellulitis but he has refused I have asked him to take 2 days off work to keep his leg elevated and he has refused this as well. In view of this I'm going to call him and Augmentin and doxycycline. 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 However after a detailed discussion regarding the risk benefits and alternatives he refuses to get admitted to the hospital. With no other recourse I will continue him on oral antibiotics as before and hopefully he'll have his infectious disease consultation this week. 06/16/2017 -- the patient was seen today by the nurse practitioner at infectious disease Ms. Dixon. 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 He has also been advise Lotrimin cream locally. she also had wise IV antibiotics for his condition if it flares up 06/23/17; patient arrives today with drainage bilaterally although the  remaining wound on the left posterior calf after cleaning up today "highlighter yellow drainage" did not look too bad. Unfortunately he has had breakdown on the right anterior leg [previously this leg had not been open and he is using a black stocking] he went to see infectious disease and is been put on clindamycin 150 mg daily, I  did not verify the dose although I'm not familiar with using clindamycin in this dosing range, perhaps for prophylaxisoo 06/27/17; I brought this patient back today to follow-up on the wound deterioration on the right lower leg together with surrounding cellulitis. I started him on doxycycline 4 days ago. This area looks better however he comes in today with intense cellulitis on the medial part of his left thigh. This is not have a wound in this area. Extremely tender. We've been using silver alginate to the wounds on the right lower leg left lower leg with bilateral 4 layer compression he is using his external compression pumps once a day 07/04/17; patient's left medial thigh cellulitis looks better. He has not been using his compression pumps as his insert said it was contraindicated with cellulitis. His right leg continues to make improvements all the wounds are still open. We only have one remaining wound on the left posterior calf. Using silver alginate to all open areas. He is on doxycycline which I started a week ago and should be finishing I gave him Augmentin after Thursday's visit for the severe cellulitis on the left medial thigh which fortunately looks better 07/14/17; the patient's left medial thigh cellulitis has resolved. The cellulitis in his right lower calf on the right also looks better. All of his wounds are stable to improved we've been using silver alginate he has completed the antibiotics I have given him. He has clindamycin 150 mg once a day prescribed by infectious disease for prophylaxis, I've advised him to start this now. We have been using  bilateral Unna boots over silver alginate to the wound areas 07/21/17; the patient is been to see infectious disease who noted his recurrent problems with cellulitis. He was not able to tolerate prophylactic clindamycin therefore he is on amoxicillin 500 twice a day. He also had a second daily dose of Lasix added By Dr. Oneta RackMcKeown but he is not taking this. Nor is he being completely compliant with his compression pumps a especially not this week. He has 2 remaining wounds one on the right posterior lateral lower leg and one on the left posterior medial lower leg. 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 The patient has Unna boots bilaterally. 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 He has bilateral Unna boots for compression and silver alginate to his wounds. Arrives today with his legs looking as good as I have seen him in quite some time. Not surprisingly his wounds look better as well with improvement on the right lateral leg venous insufficiency wound and also the left medial leg. He is still using the compression pumps once a day 08/11/17; both legs appear to be doing better wounds on the right lateral and left medial legs look better. Skin on the right leg quite good. He is been using silver alginate as the primary dressing. I'm going to use Anasept gel calcium alginate and maintain all the secondary dressings 08/18/17; the patient continues to actually do quite well. The area on his right lateral leg is just about closed the left medial also looks better although it is still moist in this area. His edema is well controlled we have been using Anasept gel with calcium alginate and the usual secondary dressings, 4 layer compression and once daily  use of his compression  pumps "always been able to manage 09/01/17; the patient continues to do reasonably well in spite of his trip to [place]. The area on the right lateral leg is epithelialized. Left is much better but still open. He has more edema and more chronic erythema on the left leg [venous inflammation] 09/08/17; he arrives today with no open wound on the right lateral leg and decently controlled edema. Unfortunately his left leg is not nearly as in his good situation as last week.he apparently had increasing edema starting on Saturday. He edema soaked through into his foot so used a plastic bag to walk around his home. The area on the medial right leg which was his open area is about the same however he has lost surface epithelium on the left lateral which is new and he has significant pain in the Achilles area of the left foot. He is already on amoxicillin chronically for prophylaxis of cellulitis in the left leg 09/15/17; he is completed a week of doxycycline and the cellulitis in the left posterior leg and Achilles area is as usual improved. He still has a lot of edema and fluid soaking through his dressings. There is no open wound on the right leg. He saw infectious disease NP today 09/22/17;As usual 1 we transition him from our compression wraps to his stockings things did not go well. He has several small open areas on the right leg. He states this was caused by the compression wrap on his skin although he did not wear this with the stockings over them. He has several superficial areas on the left leg medially laterally posteriorly. He does not have any evidence of active cellulitis especially involving the left Achilles The patient is traveling from [facility] Saturday going to [facility]. He states he isn't attempting to get an appointment with a heel objects wound center there to change his dressings. I am not completely certain whether this will work 10/06/17; the patient came in on Friday for a  nurse visit and the nurse reported that his legs actually look quite good. He arrives in clinic today for his regular follow-up visit. He has a new wound on his left third toe over the PIP probably caused by friction with his footwear. He has small areas on the left leg and a very superficial but epithelialized area on the right anterior lateral lower leg. Other than that his legs look as good as I've seen him in quite some time. We have been using silver alginate Review of systems; no chest pain no shortness of breath other than this a 10 point review of systems negative 10/20/17; seen by Dr. Meyer Russel last week. He had taken some antibiotics [doxycycline] that he had left over. Dr. Meyer Russel thought he had candida infection and declined to give him further antibiotics. He has a small wound remaining on the right lateral leg several areas on the left leg including a larger area on the left posterior several left medial and anterior and a small wound on the left lateral. The area on the left dorsal third toe looks a lot better. ROS; Gen.; no fever, respiratory no cough no sputum Cardiac no chest pain other than this 10 point review of system is negative 10/30/17; patient arrives today having fallen in the bathtub 3 days ago. It took him a while to get up. He has pain and maceration in the wounds on his left leg which have deteriorated. He has not been using his pumps  he also has some maceration on the right lateral leg. 11/03/17; patient continues to have weeping edema especially in the left leg. This saturates his dressings which were just put on on 12/27. As usual the doxycycline seems to take care of the cellulitis on his lower leg. He is not complaining of fever, chills, or other systemic symptoms. He states his leg feels a lot better on the doxycycline I gave him empirically. He also apparently gets injections at his primary doctor's officeo Rocephin for cellulitis prophylaxis. I didn't ask him  about his compression pump compliance today I think that's probably marginal. Arrives in the clinic with all of his dressings primary and secondary macerated full of fluid and he has bilateral edema 11/10/17; the patient's right leg looks some better although there is still a cluster of wounds on the right lateral. The left leg is inflamed with almost circumferential skin loss medially to laterally although we are still maintaining anteriorly. He does not have overt cellulitis there is a lot of drainage. He is not using compression pumps. We have been using silver alginate to the wound areas, there are not a lot of options here 11/17/17; the patient's right leg continues to be stable although there is still open wounds, better than last week. The inflammation in the left leg is better. Still loss of surface layer epithelium especially posteriorly. There is no overt cellulitis in the amount of edema and his left leg is really quite good, tells me he is using his compression pumps once a day. 11/24/17; patient's right leg has a small superficial wound laterally this continues to improve. The inflammation in the left leg is still improving however we have continuous surface layer epithelial loss posteriorly. There is no overt cellulitis in the amount of edema in both legs is really quite good. He states he is using his compression pumps on the left leg once a day for 5 out of 7 days 12/01/17; very small superficial areas on the right lateral leg continue to improve. Edema control in both legs is better today. He has continued loss of surface epithelialization and left posterior calf although I think this is better. We have been using silver alginate with large number of absorptive secondary dressings 4 layer on the left Unna boot on the right at his request. He tells me he is using his compression pumps once a day 12/08/17; he has no open area on the right leg is edema control is good here. On the left leg  however he has marked erythema and tenderness breakdown of skin. He has what appears to be a wrap injury just distal to the popliteal fossa. 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 There was swelling erythema and breakdown in the right leg. He has much worse skin breakdown in the left leg as well multiple open areas medially and posteriorly as well as laterally. He tells me he has been using his compression pumps but tells me he feels that the drainage out of his leg is worse when he uses a compression pumps. To be fair to him he is been saying this for a while however I don't know that I have really been listening to this. I wonder if  the compression pumps are working properly 12/22/17;. Once again he arrives with severe erythema, weeping edema from the left greater than right leg. Noncompliance with compression pumps. New this visit he is complaining of pain on the lateral aspect of the right leg and the medial aspect of his right thigh. He apparently saw his cardiologist Dr. Debara Pickett who was ordered an echocardiogram area and I think this is a step in the right direction 12/25/17; started his doxycycline Monday night. There is still intense erythema of the right leg especially in the anterior thigh although there is less tenderness. The erythema around the wound on the right lateral calf also is less tender. He still complaining of pain in the left heel. His wounds are about the same right lateral left medial left lateral. Superficial but certainly not close to closure. He denies being systemically unwell no fever chills no abdominal pain no diarrhea 12/29/17; back in follow-up of his extensive right calf and right thigh  cellulitis. I added amoxicillin to cover possible doxycycline resistant strep. This seems to of done the trick he is in much less pain there is much less erythema and swelling. He has his echocardiogram at 11:00 this morning. X-ray of the left heel was also negative. 01/05/18; the patient arrived with his edema under much better control. Now that he is retired he is able to use his compression pumps daily and sometimes twice a day per the patient. He has a wound on the right leg the lateral wound looks better. Area on the left leg also looks a lot better. He has no evidence of cellulitis in his bilateral thighs I had a quick peak at his echocardiogram. He is in normal ejection fraction and normal left ventricular function. He has moderate pulmonary hypertension moderately reduced right ventricular function. One would have to wonder about chronic sleep apnea although he says he doesn't snore. He'll review the echocardiogram with his cardiologist. 01/12/18; the patient arrives with the edema in both legs under exemplary control. He is using his compression pumps daily and sometimes twice daily. His wound on the right lateral leg is just about closed. He still has some weeping areas on the posterior left calf and lateral left calf although everything is just about closed here as well. I have spoken with Jeri Lager who is the patient's nurse practitioner and infectious disease. She was concerned that the patient had not understood that the parenteral penicillin injections he was receiving for cellulitis prophylaxis was actually benefiting him. I don't think the patient actually saw that I would tend to agree we were certainly dealing with less infections although he had a serious one last month. 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 He is healed. He'll be placed in juxtalite compression wraps and increase his lymphedema pumps to twice daily. We will follow up  again next week to ensure there are no issues with the new regiment. 01/20/18-he is here for evaluation of bilateral lower extremity weeping edema. Yesterday he was placed in compression wrap to the right lower extremity and compression stocking to left lower shrubbery. He states he uses lymphedema pumps last night and again this morning and noted a blister to the left lower extremity. On exam he was noted to have drainage to the right lower extremity. He will be placed in Unna boots bilaterally and follow-up next week 01/26/18; patient was actually discharged a week ago to his own juxta light stockings only to return the next  day with bilateral lower extremity weeping edema.he was placed in bilateral Unna boots. He arrives today with pain in the back of his left leg. There is no open area on the right leg however there is a linear/wrap injury on the left leg and weeping edema on the left leg posteriorly. I spoke with infectious disease about 10 days ago. They were disappointed that the patient elected to discontinue prophylactic intramuscular penicillin shots as they felt it was particularly beneficial in reducing the frequency of his cellulitis. I discussed this with the patient today. He does not share this view. He'll definitely need antibiotics today. Finally he is traveling to [place] and trauma leaving this Saturday and returning a week later and he does not travel with his pumps. He is going by car 01/30/18; patient was seen 4 days ago and brought back in today for review of cellulitis in the left leg posteriorly. I put him on amoxicillin this really hasn't helped as much as I might like. He is also worried because he is traveling to [facility] trauma by car. Finally we will be rewrapping him. There is no open area on the right leg over his left leg has multiple weeping areas as usual 02/09/18; The same wrap on for 10 days. He did not pick up the last doxycycline I prescribed for him. He  apparently took 4 days worth he already had. There is nothing open on his right leg and the edema control is really quite good. He's had damage in the left leg medially and laterally especially probably related to the prolonged use of Unna boots 02/12/18; the patient arrived in clinic today for a nurse visit/wrap change. He complained of a lot of pain in the left posterior calf. He is taking doxycycline that I previously prescribed for him. Unfortunately even though he used his stockings and apparently used to compression pumps twice a day he has weeping edema coming out of the lateral part of his right leg. This is coming from the lower anterior lateral skin area. 02/16/18; the patient has finished his doxycycline and will finish the amoxicillin 2 days. The area of cellulitis in the left calf posteriorly has resolved. He is no longer having any pain. He tells me he is using his compression pumps at least once a day sometimes twice. 02/23/18; the patient finished his doxycycline and Amoxil last week. On Friday he noticed a small erythematous circle about the size of a quarter on the left lower leg just above his ankle. This rapidly expanded and he now has erythema on the lateral and posterior part of the thigh. This is bright red. Also has an area on the dorsal foot just above his toes and a tender area just below the left popliteal fossa. He came off his prophylactic penicillin injections at his own insistence one or 2 months ago. This is obviously deteriorated since then 03/02/18; patient is on doxycycline and Amoxil. Culture I did last week of the weeping area on the back of his left calf grew group B strep. I have therefore renewed the amoxicillin 500 3 times a day for a further week. He has not been systemically unwell. Still complaining of an area of discomfort right under his left popliteal fossa. There is no open wound on the right leg. He tells me that he is using his pumps twice a day on  most days 03/09/18; patient arrives in clinic today completing his amoxicillin today. The cellulitis on his left leg is better. Furthermore  he tells me that he had intramuscular penicillin shots that his primary care office today. However he also states that the wrap on his right leg fell down shortly after leaving clinic last week. He developed a large blister that was present when he came in for a nurse visit later in the week and then he developed intense discomfort around this area.He tells me he is using his compression pumps 03/16/18; the patient has completed his doxycycline. The infectious part of this/cellulitis in the left heel area left popliteal area is a lot better. He has 2 open areas on the right calf. Still areas on the left calf but this is a lot better as well. 03/24/18; the patient arrives complaining of pain in the left popliteal area again. He thinks some of this is wrap injury. He has no open area on the right leg and really no open area on the left calf either except for the popliteal area. He claims to be compliant with the compression pumps 03/31/18; I gave him doxycycline last week because of cellulitis in the left popliteal area. This is a lot better although the surface epithelium is denuded off and response to this. He arrives today with uncontrolled edema in the right calf area as well as a fingernail injury in the right lateral calf. There is only a few open areas on the left 04/06/18; I gave him amoxicillin doxycycline over the last 2 [name] that the amoxicillin should be completing currently. He is not complaining of any pain or systemic symptoms. The only open areas see has is on the right lateral lower leg paradoxically I cannot see anything on the left lower leg. He tells me he is using his compression pumps twice a day on most days. Silver alginate to the wounds that are open under 4 layer compression 04/13/18; he completed antibiotics and has no new complaints. Using  his compression pumps. Silver alginate that anything that's opened 04/20/18; he is using his compression pumps religiously. Silver alginate 4 layer compression anything that's opened. He comes in today with no open wounds on the left leg but 3 on the right including a new one posteriorly. He has 2 on the right lateral and one on the right posterior. He likes Unna boots on the right leg for reasons that aren't really clear we had the usual 4 layer compression on the left. It may be necessary to move to the 4 layer compression on the right however for now I left them in the Unna boots 04/27/18; he is using his compression pumps at least once a day. He has still the wounds on the right lateral calf. The area right posteriorly has closed. He does not have an open wound on the left under 4 layer compression however on the dorsal left foot just proximal to the toes and the left third toe 2 small open areas were identified 05/11/18; he has not uses compression pumps. The areas on the right lateral calf have coalesced into one large wound necrotic surface. On the left side he has one small wound anteriorly however the edema is now weeping out of a large part of his left leg. He says he wasn't using his pumps because of the weeping fluid. I explained to him that this is the time he needs to pump more 05/18/18; patient states he is using his compression pumps twice a day. The area on the right lateral large wound albeit superficial. On the left side he has innumerable number of  small new wounds on the left calf particularly laterally but several anteriorly and medially. All these appear to have healthy granulated base these look like the remnants of blisters however they occurred under compression. The patient arrives in clinic today with his legs somewhat better. There is certainly less edema, less multiple open areas on the left calf and the right anterior leg looks somewhat better as well superficial and a  little smaller. However he relates pain and erythema over the last 3-4 days in the thigh and I looked at this today. He has not been systemically unwell no fever no chills no change in blood sugar values 05/25/18; comes in today in a better state. The severe cellulitis on his left leg seems better with the Keflex. Not as tender. He has not been systemically unwell Hard to find an open wound on the left lower leg using his compression pumps twice a day The confluent wounds on his right lateral calf somewhat better looking. These will ultimately need debridement I didn't do this today. 06/01/18; the severe cellulitis on the left anterior thigh has resolved and he is completed his Keflex. There is no open wound on the left leg however there is a superficial excoriation at the base of the third toe dorsally. Skin on the bottom of his left foot is macerated looking. The left the wounds on the lateral right leg actually looks some better although he did require debridement of the top half of this wound area with an open curet 06/09/18 on evaluation today patient appears to be doing poorly in regard to his right lower extremity in particular this appears to likely be infected he has very thick purulent discharge along with a bright green tent to the discharge. This makes me concerned about the possibility of pseudomonas. He's also having increased discomfort at this point on evaluation. Fortunately there does not appear to be any evidence of infection spreading to the other location at this time. 06/16/18 on evaluation today patient appears to actually be doing fairly well. His ulcer has actually diminished in size quite significantly at this point which is good news. Nonetheless he still does have some evidence of infection he did see infectious disease this morning before coming here for his appointment. I did review the results of their evaluation and their note today. They did actually have him  discontinue the Cipro and initiate treatment with linezolid at this time. He is doing this for the next seven days and they recommended a follow-up in four months with them. He is the keep a log of the need for intermittent antibiotic therapy between now and when he falls back up with infectious disease. This will help them gaze what exactly they need to do to try and help them out. 06/23/18; the patient arrives today with no open wounds on the left leg and left third toe healed. He is been using his compression pumps twice a day. On the right lateral leg he still has a sizable wound but this is a lot better than last time I saw this. 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 Has been using silver alginate under 4 layer compression 06/30/18; the only open wound he has is on the right lateral leg and this looks healthy. No debridement is required. We have been using silver alginate. He does not have an open wound on the left leg. There  is apparently some drainage from the dorsal proximal third toe on the left although I see no open wound here. 07/03/18 on evaluation today patient was actually here just for a nurse visit rapid change. However when he was here on Wednesday for his rat change due to having been healed on the left and then developing blisters we initiated the wrap again knowing that he would be back today for us to reevaluate and see were at. Unfortunately he has developed some cellulitis into the proximal portion of his right lower extremity even into the region of his thigh. 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 He was placed on one as what at that point. Nonetheless he is done with that and has been tolerating it well otherwise. Doxycycline which in the past really did not seem to be effective for him. Nonetheless I think the best  option may be for us to definitely reinitiate the antibiotics for a longer period of time. 07/07/18; since I last saw this patient a week ago he has had a difficult time. At that point he did not have an open wound on his left leg. We transitioned him into juxta light stockings. He was apparently in the clinic the next day with blisters on the left lateral and left medial lower calf. He also had weeping edema fluid. He was put back into a compression wrap. He was also in the clinic on Friday with intense erythema in his right thigh. Per the patient he was started on Bactrim however that didn't work at all in terms of relieving his pain and swelling. He has taken 3 doxycycline that he had left over from last time and that seems to of helped. He has blistering on the right thigh as well. 07/14/18; the erythema on his right thigh has gotten better with doxycycline that he is finishing. 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 Presumably this cellulitis in the thigh was not related to that although I think this is a bit concerning going forward. He still has an area on the right lateral calf the blister on the right medial calf just below the knee that was discussed above. On the left 2 small open areas left medial and left lateral. Edema control is adequate. He is using his compression pumps twice a day 07/20/18; continued improvement in the condition of both legs especially the edema in his bilateral thighs. He tells me he is been losing weight through a combination of diet and exercise. He is using his compression pumps twice a day. So overall she made to the remaining wounds 07/27/2018; continued improvement in condition of both legs. His edema is well controlled. The area on the right lateral leg is just about closed he had one blisters show up on the medial left upper calf. We have him in 4 layer compression. He is going on  a 10-day trip to [place], [place] and [place]. He will be driving. He wants to wear Unna boots because of the lessening amount of constriction. He will not use compression pumps while he is away 08/05/18 on evaluation today patient actually appears to be doing decently well all things considered in regard to his bilateral lower extremities. The worst ulcer is actually only posterior aspect of his left lower extremity with a four layer compression wrap cut into his leg a couple [name]  back. He did have a trip and actually had [employer] for the trip that he is worn since he was last here. Nonetheless he feels like the [employer] actually do better for him his swelling is up a little bit but he also with his trip was not taking his Lasix on a regular set schedule like he was supposed to be. He states that obviously the reason being that he cannot drive and keep going without having to urinate too frequently which makes it difficult. He did not have his pumps with him while he was away either which I think also maybe playing a role here too. 08/13/2018; the patient only has a small open wound on the right lateral calf which is a big improvement in the last month or 2. He also has the area posteriorly just below the posterior fossa on the left which I think was a wrap injury from several [name] ago. He has no current evidence of cellulitis. He tells me he is back into his compression pumps twice a day. He also tells me that while he was at the [place] somebody stole a section of his extremitease stockings 08/20/2018; back in the clinic with a much improved state. He only has small areas on the right lateral mid calf which is just about healed. This was is more substantial area for quite a prolonged period of time. He has a small open area on the left anterior tibia. The area on the posterior calf just below the popliteal fossa is closed today. He is using his compression pumps twice a  day 08/28/2018; patient has no open wound on the right leg. He has a smattering of open areas on the calf with some weeping lymphedema. More problematically than that it looks as though his wraps of slipped down in his usual he has very angry upper area of edema just below the right medial knee and on the right lateral calf. He has no open area on his feet. The patient is traveling to [facility] next week. I will send him in an antibiotic. We will continue to wrap the right leg. We ordered extremitease stockings for him last week and I plan to transition the right leg to a stocking when he gets home which will be in 10 days time. As usual he is very reluctant to take his pumps with him when he travels 09/07/2018; patient returns from [facility]. He shows me a picture of his left leg in the mid part of his trip last week with intense fire engine erythema. The picture look bad enough I would have considered sending him to the hospital. Instead he went to the wound care center in [facility]. They did not prescribe him antibiotics but he did take some doxycycline he had leftover from a previous visit. I had given him trimethoprim sulfamethoxazole before he left this did not work according to the patient. This is resulted in some improvement fortunately. He comes back with a large wound on the left posterior calf. Smaller area on the left anterior tibia. Denuded blisters on the dorsal left foot over his toes. Does not have much in the way of wounds on the right leg although he does have a very tender area on the right posterior area just below the popliteal fossa also suggestive of infection. He promises me he is back on his pumps twice a day 09/15/2018; the intense cellulitis in his left lower calf is a lot better.  The wound area on the posterior left calf is also so better. However he has reasonably extensive wounds on the dorsal aspect of his second and third toes and the proximal  foot just at the base of the toes. There is nothing open on the right leg 09/22/2018; the patient has excellent edema control in his legs bilaterally. He is using his external compression pumps twice a day. He has no open area on the right leg and only the areas in the left foot dorsally second and third toe area on the left side. He does not have any signs of active cellulitis. 10/06/2018; the patient has good edema control bilaterally. He has no open wound on the right leg. There is a blister in the posterior aspect of his left calf that we had to deal with today. He is using his compression pumps twice a day. There is no signs of active cellulitis. We have been using silver alginate to the wound areas. He still has vulnerable areas on the base of his left first second toes dorsally He has a his extremities stockings and we are going to transition him today into the stocking on the right leg. He is cautioned that he will need to continue to use the compression pumps twice a day. If he notices uncontrolled edema in the right leg he may need to go to 3 times a day. 10/13/2018; the patient came in for a nurse check on Friday he has a large flaccid blister on the right medial calf just below the knee. We unroofed this. He has this and a new area underneath the posterior mid calf which was undoubtedly a blister as well. He also has several small areas on the right which is the area we put his extremities stocking on. 10/19/2018; the patient went to see infectious disease this morning I am not sure if that was a routine follow-up in any case the doxycycline I had given him was discontinued and started on linezolid. He has not started this. It is easy to look at his left calf and the inflammation and think this is cellulitis however he is very tender in the tissue just below the popliteal fossa and I have no doubt that there is infection going on here. He states the problem he is having is that with the  compression pumps the edema goes down and then starts walking the wrap falls down. We will see if we can adhere this. He has 1 or 2 minuscule open areas on the right still areas that are weeping on the posterior left calf, the base of his left second and third toes 10/26/18; back today in clinic with quite of skin breakdown in his left anterior leg. This may have been infection the area below the popliteal fossa seems a lot better however tremendous epithelial loss on the left anterior mid tibia area over quite inexpensive tissue. He has 2 blisters on the right side but no other open wound here. 10/29/2018; came in urgently to see [place] today and we worked him in for review. He states that the 4 layer compression on the right leg caused pain he had to cut it down to roughly his mid calf this caused swelling above the wrap and he has blisters and skin breakdown today. As a result of the pain he has not been using his pumps. Both legs are a lot more edematous and there is a lot of weeping fluid. 11/02/18; arrives in clinic with continued difficulties in  the right leg> left. Leg is swollen and painful. multiple skin blisters and new open areas especially laterally. He has not been using his pumps on the right leg. 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 He is not systemically unwell. 11/09/2018; the patient claims he is being compliant with his pumps. He is finished the doxycycline I gave him last week. Culture I did of the wound on the right lateral leg showed a few very resistant methicillin staph aureus. This was resistant to doxycycline. Nevertheless he states the pain in the leg is a lot better which makes me wonder if the cultured organism was not really what was causing the problem nevertheless this is a very dangerous organism to be culturing out of any wound. His right leg is still a lot larger than the left. He is using an [employer] boot on this area,  he blames a 4-layer compression for causing the original skin breakdown which I doubt is true however I cannot talk him out of it. We have been using silver alginate to all of these areas which were initially blisters 11/16/2018; patient is being compliant with his external compression pumps at twice a day. Miraculously he arrives in clinic today with absolutely no open wounds. He has better edema control on the left where he has been using 4 layer compression versus wound of wounds on the right and I pointed this out to him. There is no inflammation in the skin in his lower legs which is also somewhat unusual for him. There is no open wounds on the dorsal left foot. He has extremitease stockings at home and I have asked him to bring these in next week. 11/25/18 patient's lower extremity on examination today on the left appears for the most part to be wound free. He does have an open wound on the lateral aspect of the right lower extremity but this is minimal compared to what I've seen in past. He does request that we go ahead and wrap the left leg as well even though there's nothing open just so hopefully it will not reopen in short order. 1/28; patient has superficial open wounds on the right lateral calf left anterior calf and left posterior calf. His edema control is adequate. 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 We have been using silver alginate as the primary dressing. He claims compliance with his compression pumps 2/4; patient has superficial open wounds on numerous areas of his left calf and again one on the left dorsal foot. The areas on the right lateral calf have healed. The cellulitis that I gave him doxycycline for last week is also resolved this was mostly on the left anterior calf just below the tibial tuberosity. His edema looks fairly well-controlled. He tells me he went to see his primary  doctor today and had blood work ordered 2/11; once again he has several open areas on the left calf left tibial area. Most of these are small and appear to have healthy granulation. He does not have anything open on the right. The edema and control in his thighs is pretty good which is usually a good indication he has been using his pumps as requested. 2/18; he continues to have several small areas on the left calf and left tibial area. Most of these are small healthy granulation. We put him in his stocking on the right  leg last week and he arrives with a superficial open area over the right upper tibia and a fairly large area on the right lateral tibia in similar condition. His edema control actually does not look too bad, he claims to be using his compression pumps twice a day 2/25. Continued small areas on the left calf and left tibial area. New areas especially on the right are identified just below the tibial tuberosity and on the right upper tibia itself. There are also areas of weeping edema fluid even without an obvious wound. He does not have a considerable degree of lymphedema but clearly there is more edema here than his skin can handle. He states he is using the pumps twice a day. We have an Unna boot on the right and 4 layer compression on the left. 3/3; he continues to have an area on the right lateral calf and right posterior calf just below the popliteal fossa. There is a fair amount of tenderness around the wound on the popliteal fossa but I did not see any evidence of cellulitis, could just be that the wrap came down and rubbed in this area. He does not have an open area on the left leg however there is an area on the left dorsal foot at the base of the third toe We have been using silver alginate to all wound areas 3/10; he did not have an open area on his left leg last time he was here a week ago. Today he arrives with a horizontal wound just below the tibial tuberosity and an  area on the left lateral calf. He has intense erythema and tenderness in this area. The area is on the right lateral calf and right posterior calf better than last week. We have been using silver alginate as usual 3/18 - Patient returns with 3 small open areas on left calf, and 1 small open area on right calf, the skin looks ok with no significant erythema, he continues the UNA boot on right and 4 layer compression on left. The right lateral calf wound is closed , the right posterior is small area. we will continue silver alginate to the areas. 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 He is been tolerating the dressing changes without complication. Fortunately she has made excellent improvement in regard to the overall status of his wounds. Unfortunately every time we cease wrapping him he ends up reopening in causing more significant issues at that point. Again I'm unsure of the best direction to take although I think the lymphedema clinic may be appropriate for him. 02/03/19 on evaluation today patient appears to be doing well in regard to the wounds that we saw him for last week unfortunately he has a new area on the proximal portion of his right medial/posterior lower extremity where the wrap somewhat slowed down and caused swelling and a blister to rub and open. Unfortunately this is the only opening that he has on either leg at this point. 02/17/19 on evaluation today patient's bilateral lower extremities appear to be doing well. He still completely healed in regard to the left lower extremity. In regard to the right lower extremity the area where the wrap and slid down and caused the blister still seems to be slightly open although this is dramatically better than during the  last evaluation two  [name] ago. I'm very pleased with the way this stands overall. 03/03/19 on evaluation today patient appears to be doing well in regard to his right lower extremity in general although he did have a new blister open this does not appear to be showing any evidence of active infection at this time. Fortunately there's No fevers, chills, nausea, or vomiting noted at this time. Overall I feel like he is making good progress it does feel like that the right leg will we perform the [employer] seems to do with a bit better than three layer wrap on the left which slid down on him. We may switch to doing bilateral in the book wraps. 5/4; I have not seen Mr. Metter in quite some time. According to our case manager he did not have an open wound on his left leg last week. He had 1 remaining wound on the right posterior medial calf. He arrives today with multiple openings on the left leg probably were blisters and/or wrap injuries from Unna boots. I do not think the Unna boot's will provide adequate compression on the left. I am also not clear about the frequency he is using the compression pumps. 03/17/19 on evaluation today patient appears to be doing excellent in regard to his lower extremities compared to last week's evaluation apparently. He had gotten significantly worse last week which is unfortunate. The [employer] wrap on the left did not seem to do very well for him at all and in fact it didn't control his swelling significantly enough he had an additional outbreak. Subsequently we go back to the four layer compression wrap on the left. This is good news. At least in that he is doing better and the wound seem to be killing him. He still has not heard anything from the lymphedema clinic. 03/24/19 on evaluation today patient actually appears to be doing much better in regard to his bilateral lower Trinity as compared to last week when I saw him. Fortunately there's no signs of active infection at this time. He  has been tolerating the dressing changes without complication. Overall I'm extremely pleased with the progress and appearance in general. 04/07/19 on evaluation today patient appears to be doing well in regard to his bilateral lower extremities. His swelling is significantly down from where it was previous. With that being said he does have a couple blisters still open at this point but fortunately nothing that seems to be too severe and again the majority of the larger openings has healed at this time. 04/14/19 on evaluation today patient actually appears to be doing quite well in regard to his bilateral lower extremities in fact I'm not even sure there's anything significantly open at this time at any site. Nonetheless he did have some trouble with these wraps where they are somewhat irritating him secondary to the fact that he has noted that the graph wasn't too close down to the end of this foot in a little bit short as well up to his knee. Otherwise things seem to be doing quite well. 04/21/19 upon evaluation today patient's wound bed actually showed evidence of being completely healed in regard to both lower extremities which is excellent news. There does not appear to be any signs of active infection which is also good news. I'm very pleased in this regard. No fevers, chills, nausea, or vomiting noted at this time. 04/28/19 on evaluation today patient appears to be doing a little bit worse in regard  to both lower extremities on the left mainly due to the fact that when he went infection disease the wrap was not wrapped quite high enough he developed a blister above this. On the right he is a small open area of nothing too significant but again this is continuing to give him some trouble he has been were in the Velcro compression that he has at home. 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 He's been tolerating the dressing changes  without complication. Fortunately there's no signs of active infection at this time. No fevers, chills, nausea, or vomiting noted at this time. We have been trying to get an appointment with her lymphedema clinic in [facility] but unfortunately nobody can get them on phone with not been able to even fax information over the patient likewise is not been able to get in touch with them. Overall I'm not sure exactly what's going on here with to reach out again today. 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 Still having a lot of drainage unfortunately. He tells me especially in the left but even on the right. There's no signs of active infection which is good news we've been using so ratcheted up to this point. 05/19/19 on evaluation today patient actually appears to be doing quite well with regard to his left lower extremity which is great news. Fortunately in regard to the right lower extremity has an issues with his wrap and he subsequently did remove this from what I'm understanding. Nonetheless long story short is what he had rewrapped once he removed it subsequently had maggots underneath this wrap whenever he came in for evaluation today. With that being said they were obviously completely cleaned away by the nursing staff. The visit today which is excellent news. However he does appear to potentially have some infection around the right ankle region where the maggots were located as well. He will likely require anabiotic therapy today. 05/26/19 on evaluation today patient actually appears to be doing much better in regard to his bilateral lower extremities. I feel like the infection is under much better control. With that being said there were maggots noted when the wrap was removed yet again today. Again this could have potentially been left over from previous although at this time there does  not appear to be any signs of significant drainage there was obviously on the wrap some drainage as well this contracted gnats or otherwise. Either way I do not see anything that appears to be doing worse in my pinion and in fact I think his drainage has slowed down quite significantly likely mainly due to the fact to his infection being under better control. 06/02/2019 on evaluation today patient actually appears to be doing well with regard to his bilateral lower extremities there is no signs of active infection at this time which is great news. With that being said he does have several open areas more so on the right than the left but nonetheless these are all significantly better than previously noted. 06/09/2019 on evaluation today patient actually appears to be doing well. His wrap stayed up and he did not cause any problems he had more drainage on the right compared to the left but overall I do not see any major issues at this time which is great news. 06/16/2019 on evaluation today patient appears to be  doing excellent with regard to his lower extremities the only area that is open is a new blister that can have opened as of today on the medial ankle on the left. Other than this he really seems to be doing great I see no major issues at this point. 06/23/2019 on evaluation today patient appears to be doing quite well with regard to his bilateral lower extremities. In fact he actually appears to be almost completely healed there is a small area of weeping noted of the right lower extremity just above the ankle. Nonetheless fortunately there is no signs of active infection at this time which is good news. No fevers, chills, nausea, vomiting, or diarrhea. 8/24; the patient arrived for a nurse visit today but complained of very significant pain in the left leg and therefore I was asked to look at this. Noted that he did not have an open area on the left leg last week nevertheless this was wrapped.  The patient states that he is not been able to put his compression pumps on the left leg because of the discomfort. He has not been systemically unwell 06/30/2019 on evaluation today patient unfortunately despite being excellent last week is doing much worse with regard to his left lower extremity today. In fact he had to come in for a nurse on Monday where his left leg had to be rewrapped due to excessive weeping Dr. Leanord Hawking placed him on doxycycline at that point. Fortunately there is no signs of active infection Systemically at this time which is good news. 07/07/2019 in regard to the patient's wounds today he actually seems to be doing well with his right lower extremity there really is nothing open or draining at this point this is great news. Unfortunately the left lower extremity is given him additional trouble at this time. There does not appear to be any signs of active infection nonetheless he does have a lot of edema and swelling noted at this point as well as blistering all of which has led to a much more poor appearing leg at this time compared to where it was 2 [name] ago when it was almost completely healed. Obviously this is a little discouraging for the patient. He is try to contact the lymphedema clinic in [place] he has not been able to get through to them. 07/14/2019 on evaluation today patient actually appears to be doing slightly better with regard to his left lower extremity ulcers. Overall I do feel like at least at the top of the wrap that we have been placing this area has healed quite nicely and looks much better. The remainder of the leg is showing signs of improvement. Unfortunately in the thigh area he still has an open region on the left and again on the right he has been utilizing just a Band-Aid on an area that also opened on the thigh. Again this is an area that were not able to wrap although we did do an Ace wrap to provide some compression that something that  obviously is a little less effective than the compression wraps we have been using on the lower portion of the leg. He does have an appointment with the lymphedema clinic in [facility] on Friday. 07/21/2019 on evaluation today patient appears to be doing better with regard to his lower extremity ulcers. He has been tolerating the dressing changes without complication. Fortunately there is no signs of active infection at this time. No fevers, chills, nausea, vomiting, or diarrhea. I did receive  the paperwork from the physical therapist at the lymphedema clinic in [place]. Subsequently I signed off on that this morning and sent that back to him for further progression with the treatment plan. 07/28/2019 on evaluation today patient appears to be doing very well with regard to his right lower extremity where I do not see any open wounds at this point. Fortunately he is feeling great as far as that is concerned as well. In regard to the left lower extremity he has been having issues with still several areas of weeping and edema although the upper leg is doing better his lower leg still I think is going require the compression wrap at this time. No fevers, chills, nausea, vomiting, or diarrhea. 08/04/2019 on evaluation today patient unfortunately is having new wounds on the right lower extremity. Again we have been using Unna boot wrap on that side. We switched him to using his juxta lite wrap at home. With that being said he tells me he has been using it although his legs extremely swollen and to be honest really does not appear that he has been. I cannot know that for sure however. Nonetheless he has multiple new wounds on the right lower extremity at this time. Obviously we will have to see about getting this rewrapped for him today. 08/11/2019 on evaluation today patient appears to be doing fairly well with regard to his wounds. He has been tolerating the dressing changes including the  compression wraps without complication. He still has a lot of edema in his upper thigh regions bilaterally he is supposed to be seeing the lymphedema clinic on the 15th of this month once his wraps arrive for the upper part of his legs. 08/18/2019 on evaluation today patient appears to be doing well with regard to his bilateral lower extremities at this point. He has been tolerating the dressing changes without complication. Fortunately there is no signs of active infection which is also good news. He does have a couple weeping areas on the first and second toe of the right foot he also has just a small area on the left foot upper leg and a small area on the left lower leg but overall he is doing quite well in my opinion. He is supposed to be getting his wraps shortly in fact tomorrow and then subsequently is seeing the lymphedema clinic next Wednesday on the 21st. Of note he is also leaving on the 25th to go on vacation for a week to the beach. For that reason and since there is some uncertainty about what there can be doing at lymphedema clinic next Wednesday I am get a make an appointment for next Friday here for [place] to see what we need to do for him prior to him leaving for vacation. 10/23; patient arrives in considerable pain predominantly in the upper posterior calf just distal to the popliteal fossa also in the wound anteriorly above the major wound. This is probably cellulitis and he has had this recurrently in the past. He has no open wound on the right side and he has had an Radio broadcast assistant in that area. Finally I note that he has an area on the left posterior calf which by enlarge is mostly epithelialized. This protrudes beyond the borders of the surrounding skin in the setting of dry scaly skin and lymphedema. The patient is leaving for [facility] on Sunday. Per his longstanding pattern, he will not take his compression pumps with him predominantly out of fear that they  will be stolen. He  therefore asked that we put a Unna boot back on the right leg. He will also contact the wound care center in [facility] to see if they can change his dressing in the mid week. 11/3; patient returned from his vacation to [facility]. He was seen on 1 occasion at their wound care center. They did a 2 layer compression system as they did not have our 4-layer wrap. I am not completely certain what they put on the wounds. They did not change the Unna boot on the right. The patient is also seeing a lymphedema specialist physical therapist in [place]. It appears that he has some compression sleeve for his thighs which indeed look quite a bit better than I am used to seeing. He pumps over these with his external compression pumps. 11/10; the patient has a new wound on the right medial thigh otherwise there is no open areas on the right. He has an area on the left leg posteriorly anteriorly and medially and an area over the left second toe. We have been using silver alginate. He thinks the injury on his thigh is secondary to friction from the compression sleeve he has. 11/17; the patient has a new wound on the right medial thigh last week. He thinks this is because he did not have a underlying stocking for his thigh juxta lite apparatus. He now has this. The area is fairly large and somewhat angry but I do not think he has underlying cellulitis. He has a intact blister on the right anterior tibial area. Small wound on the right great toe dorsally Small area on the medial left calf. 11/30; the patient does not have any open areas on his right leg and we did not take his juxta lite stocking off. However he states that on Friday his compression wrap fell down lodging around his upper mid calf area. As usual this creates a lot of problems for him. He called urgently today to be seen for a nurse visit however the nurse visit turned into a provider visit because of extreme erythema and pain in the  left anterior tibia extending laterally and posteriorly. The area that is problematic is extensive 10/06/2019 upon evaluation today patient actually appears to be doing poorly in regard to his left lower extremity. He Dr. Leanord Hawking did place him on doxycycline this past Monday apparently due to the fact that he was doing much worse in regard to this left leg. Fortunately the doxycycline does seem to be helping. Unfortunately we are still having a very difficult time getting his edema under any type of control in order to anticipate discharge at some point. The only way were really able to control his lymphedema really is with compression wraps and that has only even seemingly temporary. He has been seeing a lymphedema clinic they are trying to help in this regard but still this has been somewhat frustrating in general for the patient. 10/13/19 on evaluation today patient appears to be doing excellent with regard to his right lower extremity as far as the wounds are concerned. His swelling is still quite extensive unfortunately. He is still having a lot of drainage from the thigh areas bilaterally which is unfortunate. He's been going to lymphedema clinic but again he still really does not have this edema under control as far as his lower extremities are concern. With regard to his left lower extremity this seems to be improving and I do believe the doxycycline has been  of benefit for him. He is about to complete the doxycycline. 10/20/2019 on evaluation today patient appears to be doing poorly in regard to his bilateral lower extremities. More in the right thigh he has a lot of irritation at this site unfortunately. In regard to the left lower extremity the wrap was not quite as high it appears and does seem to have caused him some trouble as well. Fortunately there is no evidence of systemic infection though he does have some blue-green drainage which has me concerned for the possibility of  Pseudomonas. He tells me he is previously taking Cipro without complications and he really does not care for Levaquin however due to some of the side effects he has. He is not allergic to any medications specifically antibiotics that were aware of. 10/27/2019 on evaluation today patient actually does appear to be for the most part doing better when compared to last week's evaluation. With that being said he still has multiple open wounds over the bilateral lower extremities. He actually forgot to start taking the Cipro and states that he still has the whole bottle. He does have several new blisters on left lower extremity today I think I would recommend he go ahead and take the Cipro based on what I am seeing at this point. 12/30-Patient comes at 1 week visit, 4 layer compression wraps on the left and Unna boot on the right, primary dressing Xtrasorb and silver alginate. Patient is taking his Cipro and has a few more days left probably 5-6, and the legs are doing better. He states he is using his compressions devices which I believe he has 11/10/2019 on evaluation today patient actually appears to be much better than last time I saw him 2 [name] ago. His wounds are significantly improved and overall I am very pleased in this regard. Fortunately there is no signs of active infection at this time. He is just a couple of days away from completing Cipro. Overall his edema is much better he has been using his lymphedema pumps which I think is also helping at this point. 11/17/2019 on evaluation today patient appears to be doing excellent in regard to his wounds in general. His legs are swollen but not nearly as much as they have been in the past. Fortunately he is tolerating the compression wraps without complication. No fevers, chills, nausea, vomiting, or diarrhea. He does have some erythema however in the distal portion of his right lower extremity specifically around the forefoot and toes there is a  little bit of warmth here as well. 11/24/2019 on evaluation today patient appears to be doing well with regard to his right lower extremity I really do not see any open wounds at this point. His left lower extremity does have several open areas and his right medial thigh also is open. Other than this however overall the patient seems to be making good progress and I am very pleased at this point. 12/01/2019 on evaluation today patient appears to be doing poorly at this point in regard to his left lower extremity has several new blisters despite the fact that we have him in compression wraps. In fact he had a 4-layer compression wrap, his upper thigh wrapped from lymphedema clinic, and a juxta light over top of the 4 layer compression wrap the lymphedema clinic applied and despite all this he still develop blisters underneath. Obviously this does have me concerned about the fact that unfortunately despite what we are doing to try to get wounds  healed he continues to have new areas arise I do not think he is ever good to be at the point where he can realistically just use wraps at home to keep things under control. Typically when we heal him it takes about 1-2 days before he is back in the clinic with severe breakdown and blistering of his lower extremities bilaterally. This is happened numerous times in the past. Unfortunately I think that we may need some help as far as overall fluid overload to kind of limit what we are seeing and get things under better control. 12/08/2019 on evaluation today patient presents for follow-up concerning his ongoing bilateral lower extremity edema. Unfortunately he is still having quite a bit of swelling the compression wraps are controlling this to some degree but he did see Dr. [name] his cardiologist I do have that available for review today as far as the appointment was concerned that was on 12/06/2019. Obviously that she has been 2 days ago. The patient states that he  is only been taking the Lasix 80 mg 1 time a day he had told me previously he was taking this twice a day. Nonetheless Dr. [name] recommended this be up to 80 mg 2 times a day for the patient as he did appear to be fluid overloaded. With that being said the patient states he did this yesterday and he was unable to go anywhere or do anything due to the fact that he was constantly having to urinate. Nonetheless I think that this is still good to be something that is important for him as far as trying to get his edema under control at all things that he is going to be able to just expect his wounds to get under control and things to be better without going through at least a period of time where he is trying to stabilize his fluid management in general and I think increasing the Lasix is likely the first step here. It was also mentioned the possibility that the patient may require metolazone. With that being said he wanted to have the patient take Lasix twice a day first and then reevaluating 2 months to see where things stand. 12/15/2019 upon evaluation today patient appears to be doing regard to his legs although his toes are showing some signs of weeping especially on the left at this point to some degree on the right. There does not appear to be any signs of active infection and overall I do feel like the compression wraps are doing well for him but he has not been able to take the Lasix at home and the increased dose that Dr. [name] recommended. He tells me that just not go to be feasible for him. Nonetheless I think in this case he should probably send a message to Dr. [name] in order to discuss options from the standpoint of possible admission to get the fluid off or otherwise going forward. 12/22/2019 upon evaluation today patient appears to be doing fairly well with regard to his lower extremities at this point. In fact he would be doing excellent if it was not for the fact that his right anterior  thigh apparently had an allergic reaction to adhesive tape that he used. The wound itself that we have been monitoring actually appears to be healed. There is a lot of irritation at this point. Electronic Signature(s) Signed: 12/22/2019 11:31:06 AM By: Lenda Kelp PA-C Entered By: Lenda Kelp on 12/22/2019 11:31:06 -------------------------------------------------------------------------------- Physical Exam Details Patient Name:  Date of Service: [name], [name] 12/22/2019 10:15 AM Medical Record [id number] Patient Account Number: [id number] Date of Birth/Sex: Treating RN: 1951/04/22 (69 y.o. [name] Primary Care Provider: Nicoletta [name] Other Clinician: Referring Provider: Treating Provider/Extender:Stone [name], Kevin Cedar, [name] in Treatment: 204 Constitutional Well-nourished and well-hydrated in no acute distress. Respiratory normal breathing without difficulty. Psychiatric this patient is able to make decisions and demonstrates good insight into disease process. Alert and Oriented x 3. pleasant and cooperative. Notes Patient's lower extremities actually are doing quite well. I am very pleased with how things appear at this point. There is no signs of active infection which is also good news. No fevers, chills, nausea, vomiting, or diarrhea. Electronic Signature(s) Signed: 12/22/2019 11:32:00 AM By: Lenda Kelp PA-C Entered By: Lenda Kelp on 12/22/2019 11:32:00 -------------------------------------------------------------------------------- Physician Orders Details Patient Name: Date of Service: ECTOR, LAUREL 12/22/2019 10:15 AM Medical Record [id number] Patient Account Number: [id number] Date of Birth/Sex: Treating RN: [birth year] (69 y.o. 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 Dressing Change Frequency Do not change entire dressing for one week. - both lower legs Skin Barriers/Peri-Wound Care Moisturizing lotion TCA Cream or Ointment - to legs mixed with lotion Wound #145R Right,Medial Upper Leg TCA Cream or Ointment - to rash daily Wound Cleansing May shower with protection. Primary Wound Dressing Wound #156 Left,Dorsal Foot Calcium Alginate with Silver Wound #157 Left Toe Great Calcium Alginate with Silver Secondary Dressing Wound #145R Right,Medial Upper Leg Kerlix/Rolled Gauze ABD pad - NO TAPE ON SKIN Wound #156 Left,Dorsal Foot Dry Gauze Wound #157 Left Toe Great Dry Gauze Edema Control 4 layer compression: Left lower extremity - unna boot at upper portion of lower leg. pad bend of foot with foam Unna Boot to Right Lower Extremity Avoid standing for long periods of time Elevate legs to the level of the heart or above for 30 minutes daily and/or when sitting, a frequency of: - throughout the day Exercise regularly Segmental Compressive Device. - lymphadema pumps 60 min 2 times per day Other: - lymphadema wraps to upper leg per [facility] clinic Patient Medications Allergies: No Known Allergies Notifications Medication Indication Start End doxycycline hyclate 12/22/2019 DOSE 1 - oral 100 mg capsule - 1 capsule oral taken 2 times a day for 14 days triamcinolone  acetonide 12/22/2019 DOSE topical 0.1 % ointment - ointment topical applied with each dressing change as directed to your legs Electronic Signature(s) Signed: 12/22/2019 11:35:29 AM By: Lenda Kelp PA-C Previous Signature: 12/22/2019 11:33:22 AM Version By: Lenda Kelp PA-C Entered By: Lenda Kelp on 12/22/2019 11:35:24 -------------------------------------------------------------------------------- Problem List Details Patient Name: Date of Service: [name] 12/22/2019 10:15 AM Medical Record [id number] Patient Account Number: [id number] Date of Birth/Sex: Treating RN: 1951-04-08 (69 y.o. [name] Primary Care Provider: Nicoletta [name] Other Clinician: Referring Provider: Treating Provider/Extender:Stone [name], Kevin Cedar, [name] in Treatment: 204 Active Problems ICD-10 Evaluated  Encounter Code Description Active Date Today Diagnosis L97.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 right calf limited to 06/30/2018 No Yes breakdown of skin L97.221 Non-pressure chronic ulcer of left calf limited to 09/30/2016 No Yes breakdown of skin I87.333 Chronic venous hypertension (idiopathic) with ulcer 01/22/2016 No Yes and inflammation of bilateral lower extremity I89.0 Lymphedema, not elsewhere classified 01/22/2016 No Yes E11.622 Type 2 diabetes mellitus with other skin ulcer 01/22/2016 No Yes 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, 01/22/2016 No Yes unspecified L03.116 Cellulitis of left lower limb 04/01/2017 No Yes Inactive Problems ICD-10 Code Description Active Date Inactive Date 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 06/30/2017 skin L97.521 Non-pressure chronic ulcer of other part of left foot limited to 04/27/2018 04/27/2018 breakdown of skin L03.115 Cellulitis of right lower limb 12/22/2017 12/22/2017 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 with other specified 06/30/2018  06/30/2018 severity L97.511 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 06/30/2018 06/30/2018 breakdown of skin Resolved Problems Electronic Signature(s) Signed: 12/22/2019 10:33:43 AM By: Lenda Kelp PA-C Entered By: Lenda Kelp on 12/22/2019 10:33:42 -------------------------------------------------------------------------------- Progress Note Details Patient Name: Date of Service: [name], [name] 12/22/2019 10:15 AM Medical Record [id number] Patient Account Number: [id number] Date of Birth/Sex: Treating RN: 10/30/1951 (69 y.o. [name] Primary Care Provider: Nicoletta [name] Other Clinician: Referring Provider: Treating Provider/Extender:Stone [name], Kevin Cedar, [name] in Treatment: 204 Subjective Chief Complaint Information obtained from Patient patient is here for evaluation venous/lymphedema weeping History of Present Illness (HPI) Referred by PCP for consultation. Patient has long standing history of BLE venous stasis, no prior ulcerations. At beginning of month, developed cellulitis and weeping. Received IM Rocephin followed by Keflex and resolved. Wears compression stocking, appr 6 months old. Not sure strength. No present drainage. 01/22/16 this is a patient who is a type II diabetic on insulin. 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 He tells me he religiously wears pressure stockings of uncertain strength. He was here with weeping edema about 8 months ago but did not have an open wound. Roughly a month ago he had a reopening on his bilateral legs. He is been using bandages and Neosporin. He does not complain of pain. 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 Last BUN/creatinine I have is from 11/20/15 at 13 and 1.0 respectively 01/29/16; patient arrives today having tolerated the  Profore wrap. He brought in his stockings and these are 18 mmHg stockings he bought from [place]. The compression here is likely inadequate. He does not complain of pain or excessive drainage she has no systemic symptoms. The wound on the right looks improved as does the one on the left although one on the left is more substantial with still tissue at risk below the actual wound area on the bilateral posterior calf 02/05/16; patient arrives with poor edema control. He states that we did put a 4 layer compression on it last week. No weight appear 5 this. 02/12/16; the area on the posterior right Has healed. The left Has a substantial wound that has necrotic surface eschar that requires a debridement with a curette. 02/16/16;the patient called or a Nurse visit secondary to increased swelling. He had been in earlier in the week with his right leg healed. He was transitioned to is on pressure stocking on the right leg with the only  open wound on the left, a substantial area on the left posterior calf. Note he has a history of severe lower extremity edema, he has a history of chronic atrial fibrillation but not heart failure per my notes but I'll need to research this. He is not complaining of chest pain shortness of breath or orthopnea. The intake nurse noted blisters on the previously closed right leg 02/19/16; this is the patient's regular visit day. I see him on Friday with escalating edema new wounds on the right leg and clear signs of at least right ventricular heart failure. I increased his Lasix to 40 twice a day. He is returning currently in follow-up. States he is noticed a decrease in that the edema 02/26/16 patient's legs have much less edema. There is nothing really open on the right leg. The left leg has improved condition of the large superficial wound on the posterior left leg 03/04/16; edema control is very much better. The patient's right leg wounds have healed. On the left leg he  continues to have severe venous inflammation on the posterior aspect of the left leg. There is no tenderness and I don't think any of this is cellulitis. 03/11/16; patient's right leg is married healed and he is in his own stocking. The patient's left leg has deteriorated somewhat. There is a lot of erythema around the wound on the posterior left leg. There is also a significant rim of erythema posteriorly just above where the wrap would've ended there is a new wound in this location and a lot of tenderness. Can't rule out cellulitis in this area. 03/15/16; patient's right leg remains healed and he is in his own stocking. The patient's left leg is much better than last review. His major wound on the posterior aspect of his left Is almost fully epithelialized. He has 3 small injuries from the wraps. Really. Erythema seems a lot better on antibiotics 03/18/16; right leg remains healed and he is in his own stocking. The patient's left leg is much better. The area on the posterior aspect of the left calf is fully epithelialized. His 3 small injuries which were wrap injuries on the left are improved only one seems still open his erythema has resolved 03/25/16; patient's right leg remains healed and he is in his own stocking. There is no open area today on the left leg posterior leg is completely closed up. His wrap injuries at the superior aspect of his leg are also resolved. He looks as though he has some irritation on the dorsal ankle but this is fully epithelialized without evidence of infection. 03/28/16; we discharged this patient on Monday. Transitioned him into his own stocking. There were problems almost immediately with uncontrolled swelling weeping edema multiple some of which have opened. He does not feel systemically unwell in particular no chest pain no shortness of breath and he does not feel 04/08/16; the edema is under better control with the Profore light wrap but he still has pitting edema.  There is one large wound anteriorly 2 on the medial aspect of his left leg and 3 small areas on the superior posterior calf. Drainage is not excessive he is tolerating a Profore light well 04/15/16; put a Profore wrap on him last week. This is controlled is edema however he had a lot of pain on his left anterior foot most of his wounds are healed 04/22/16 once again the patient has denuded areas on the left anterior foot which he states are because his wrap slips  up word. He saw his primary physician today is on Lasix 40 twice a day and states that he his weight is down 20 pounds over the last 3 months. 04/29/16: Much improved. left anterior foot much improved. He is now on Lasix 80 mg per day. Much improved edema control 05/06/16; I was hoping to be able to discharge him today however once again he has blisters at a low level of where the compression was placed last week mostly on his left lateral but also his left medial leg and a small area on the anterior part of the left foot. 05/09/16; apparently the patient went home after his appointment on 7/4 later in the evening developing pain in his upper medial thigh together with subjective fever and chills although his temperature was not taken. The pain was so intense he felt he would probably have to call 911. However he then remembered that he had leftover doxycycline from a previous round of antibiotics and took these. By the next morning he felt a lot better. He called and spoke to one of our nurses and I approved doxycycline over the phone thinking that this was in relation to the wounds we had previously seen although they were definitely were not. The patient feels a lot better old fever no chills he is still working. Blood sugars are reasonably controlled 05/13/16; patient is back in for review of his cellulitis on his anterior medial upper thigh. He is taking doxycycline this is a lot better. Culture I did of the nodular area on the dorsal  aspect of his foot grew MRSA this also looks a lot better. 05/20/16; the patient is cellulitis on the medial upper thigh has resolved. All of his wound areas including the left anterior foot, areas on the medial aspect of the left calf and the lateral aspect of the calf at all resolved. He has a new blister on the left dorsal foot at the level of the fourth toe this was excised. No evidence of infection 05/27/16; patient continues to complain weeping edema. He has new blisterlike wounds on the left anterior lateral and posterior lateral calf at the top of his wrap levels. The area on his left anterior foot appears better. He is not complaining of fever, pain or pruritus in his feet. 05/30/16; the patient's blisters on his left anterior leg posterior calf all look improved. He did not increase the Lasix 100 mg as I suggested because he was going to run out of his 40 mg tablets. He is still having weeping edema of his toes 06/03/16; I renewed his Lasix at 80 mg once a day as he was about to run out when I last saw him. He is on 80 mg of Lasix now. I have asked him to cut down on the excessive amount of water he was drinking and asked him to drink according to his thirst mechanisms 06/12/2016 -- was seen 2 days ago and was supposed to wear his compression stockings at home but he is developed lymphedema and superficial blisters on the left lower extremity and hence came in for a review 06/24/16; the remaining wound is on his left anterior leg. He still has edema coming from between his toes. There is lymphedema here however his edema is generally better than when I last saw this. He has a history of atrial fibrillation but does not have a known history of congestive heart failure nevertheless I think he probably has this at least on a diastolic basis.  07/01/16 I reviewed his echocardiogram from January 2017. This was essentially normal. He did not have LVH, EF of 55-60%. His right ventricular function  was normal although he did have trivial tricuspid and pulmonic regurgitation. This is not audible on exam however. I increased his Lasix to do massive edema in his legs well above his knees I think in early July. He was also drinking an excessive amount of water at the time. 07/15/16; missed his appointment last week because of the Labor Day holiday on Monday. He could not get another appointment later in the week. Started to feel the wrap digging in superiorly so we remove the top half and the bottom half of his wrap. He has extensive erythema and blistering superiorly in the left leg. Very tender. Very swollen. Edema in his foot with leaking edema fluid. He has not been systemically unwell 07/22/16; the area on the left leg laterally required some debridement. The medial wounds look more stable. His wrap injury wounds appear to have healed. Edema and his foot is better, weeping edema is also better. He tells me he is meeting with the supplier of the external compression pumps at work 08/05/16; the patient was on vacation last week in [facility]. His wrap is been on for an extended period of time. Also over the weekend he developed an extensive area of tender erythema across his anterior medial thigh. He took to doxycycline yesterday that he had leftover from a previous prescription. The patient complains of weeping edema coming out of his toes 08/08/16; I saw this patient on 10/2. He was tender across his anterior thigh. I put him on doxycycline. He returns today in follow-up. He does not have any open wounds on his lower leg, he still has edema weeping into his toes. 08/12/16; patient was seen back urgently today to follow-up for his extensive left thigh cellulitis/erysipelas. He comes back with a lot less swelling and erythema pain is much better. I believe I gave him Augmentin and Cipro. His wrap was cut down as he stated a roll down his legs. He developed blistering above the level of the wrap  that remained. He has 2 open blisters and 1 intact. 08/19/16; patient is been doing his primary doctor who is increased his Lasix from 40-80 once a day or 80 already has less edema. Cellulitis has remained improved in the left thigh. 2 open areas on the posterior left calf 08/26/16; he returns today having new open blisters on the anterior part of his left leg. He has his compression pumps but is not yet been shown how to use some vital representative from the supplier. 09/02/16 patient returns today with no open wounds on the left leg. Some maceration in his plantar toes 09/10/2016 -- Dr. Leanord Hawking had recently discharged him on 09/02/2016 and he has come right back with redness swelling and some open ulcers on his left lower extremity. He says this was caused by trying to apply his compression stockings and he's been unable to use this and has not been able to use his lymphedema pumps. He had some doxycycline leftover and he has started on this a few days ago. 09/16/16; there are no open wounds on his leg on the left and no evidence of cellulitis. 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 He does not complain of symptoms here. I am not clear use using his external compression pumps. 09/23/16; I have not seen this patient  in 2 [name]. He canceled his appointment 10 days ago as he was going on vacation. He tells me that on Monday he noticed a large area on his posterior left leg which is been draining copiously and is reopened into a large wound. He is been using ABDs and the external part of his juxtalite, according to our nurse this was not on properly. 10/07/16; Still a substantial area on the posterior left leg. Using silver alginate 10/14/16; in general better although there is still open area which looks healthy. Still using silver alginate. He reminds me that this happen before he left for [facility] Today while he was showering in  the morning. He had been using his juxtalite's 10/21/16; the area on his posterior left leg is fully epithelialized. However he arrives today with a large area of tender erythema in his medial and posterior left thigh just above the knee. I have marked the area. Once again he is reluctant to consider hospitalization. I treated him with oral antibiotics in the past for a similar situation with resolution I think with doxycycline however this area it seems more extensive to me. He is not complaining of fever but does have chills and says states he is thirsty. His blood sugar today was in the 140s at home 10/25/16 the area on his posterior left leg is fully epithelialized although there is still some weeping edema. The large area of tenderness and erythema in his medial and posterior left thigh is a lot less tender although there is still a lot of swelling in this thigh. He states he feels a lot better. He is on doxycycline and Augmentin that I started last week. This will continued until Tuesday, December 26. I have ordered a duplex ultrasound of the left thigh rule out DVT whether there is an abscess something that would need to be drained I would also like to know. 11/01/16; he still has weeping edema from a not fully epithelialized area on his left posterior calf. Most of the rest of this looks a lot better. He has completed his antibiotics. His thigh is a lot better. Duplex ultrasound did not show a DVT in the thigh 11/08/16; he comes in today with more Denuded surface epithelium from the posterior aspect of his calf. There is no real evidence of cellulitis. The superior aspect of his wrap appears to have put quite an indentation in his leg just below the knee and this may have contributed. He does not complain of pain or fever. We have been using silver alginate as the primary dressing. The area of cellulitis in the right thigh has totally resolved. He has been using his compression stockings  once a week 11/15/16; the patient arrives today with more loss of epithelium from the posterior aspect of his left calf. He now has a fairly substantial wound in this area. The reason behind this deterioration isn't exactly clear although his edema is not well controlled. He states he feels he is generally more swollen systemically. He is not complaining of chest pain shortness of breath fever. Tells me he has an appointment with his primary physician in early February. He is on 80 mg of oral Lasix a day. He claims compliance with the external compression pumps. He is not having any pain in his legs similar to what he has with his recurrent cellulitis 11/22/16; the patient arrives a follow-up of his large area on his left lateral calf. This looks somewhat better today. He came in earlier  in the week for a dressing change since I saw him a week ago. He is not complaining of any pain no shortness of breath no chest pain 11/28/16; the patient arrives for follow-up of his large area on the left lateral calf this does not look better. In fact it is larger weeping edema. The surface of the wound does not look too bad. We have been using silver alginate although I'm not certain that this is a dressing issue. 12/05/16; again the patient follows up for a large wound on the left lateral and left posterior calf this does not look better. There continues to be weeping edema necrotic surface tissue. More worrisome than this once again there is erythema below the wound involving the distal Achilles and heel suggestive of cellulitis. He is on his feet working most of the day of this is not going well. We are changing his dressing twice a week to facilitate the drainage. 12/12/16; not much change in the overall dimensions of the large area on the left posterior calf. This is very inflamed looking. I gave him an. Doxycycline last week does not really seem to have helped. He found the wrap very painful indeed it seems to  of dog into his legs superiorly and perhaps around the heel. He came in early today because the drainage had soaked through his dressings. 12/19/16- patient arrives for follow-up evaluation of his left lower extremity ulcers. He states that he is using his lymphedema pumps once daily when there is "no drainage". He admits to not using his lipedema pumps while under current treatment. His blood sugars have been consistently between 150-200. 12/26/16; the patient is not using his compression pumps at home because of the wetness on his feet. I've advised him that I think it's important for him to use this daily. He finds his feet too wet, he can put a plastic bag over his legs while he is in the pumps. Otherwise I think will be in a vicious circle. We are using silver alginate to the major area on his left posterior calf 01/02/17; the patient's posterior left leg has further of all into 3 open wounds. All of them covered with a necrotic surface. He claims to be using his compression pumps once a day. His edema control is marginal. Continue with silver alginate 01/10/17; the patient's left posterior leg actually looks somewhat better. There is less edema, less erythema. Still has 3 open areas covered with a necrotic surface requiring debridement. He claims to be using his compression pumps once a day his edema control is better 01/17/17; the patient's left posterior calf look better last week when I saw him and his wrap was changed 2 days ago. He has noted increasing pain in the left heel and arrives today with much larger wounds extensive erythema extending down into the entire heel area especially tender medially. He is not systemically unwell CBGs have been controlled no fever. Our intake nurse showed me limegreen drainage on his AVD pads. 01/24/17; his usual this patient responds nicely to antibiotics last week giving him Levaquin for presumed Pseudomonas. The whole entire posterior part of his leg is  much better much less inflamed and in the case of his Achilles heel area much less tender. He has also had some epithelialization posteriorly there are still open areas here and still draining but overall considerably better 01/31/17- He has continue to tolerate the compression wraps. he states that he continues to use the lymphedema pumps daily, and  can increase to twice daily on the weekends. He is voicing no complaints or concerns regarding his LLE ulcers 02/07/17-he is here for follow-up evaluation. He states that he noted some erythema to the left medial and anterior thigh, which he states is new as of yesterday. He is concerned about recurrent cellulitis. He states his blood sugars have been slightly elevated, this morning in the 180s 02/14/17; he is here for follow-up evaluation. When he was last here there was erythema superiorly from his posterior wound in his anterior thigh. He was prescribed Levaquin however a culture of the wound surface grew MRSA over the phone I changed him to doxycycline on Monday and things seem to be a lot better. 02/24/17; patient missed his appointment on Friday therefore we changed his nurse visit into a physician visit today. Still using silver alginate on the large area of the posterior left thigh. He isn't new area on the dorsal left second toe 03/03/17; actually better today although he admits he has not used his external compression pumps in the last 2 days or so because of work responsibilities over the weekend. 03/10/17; continued improvement. External compression pumps once a day almost all of his wounds have closed on the posterior left calf. Better edema control 03/17/17; in general improved. He still has 3 small open areas on the lateral aspect of his left leg however most of the area on the posterior part of his leg is epithelialized. He has better edema control. He has an ABD pad under his stocking on the right anterior lower leg although he did not let us  look at that today. 03/24/17; patient arrives back in clinic today with no open areas however there are areas on the posterior left calf and anterior left calf that are less than 100% epithelialized. His edema is well controlled in the left lower leg. There is some pitting edema probably lymphedema in the left upper thigh. He uses compression pumps at home once per day. I tried to get him to do this twice a day although he is very reticent. 04/01/2017 -- for the last 2 days he's had significant redness, tenderness and weeping and came in for an urgent visit today. 04/07/17; patient still has 6 more days of doxycycline. He was seen by Dr. Meyer Russel last Wednesday for cellulitis involving the posterior aspect, lateral aspect of his Involving his heel. For the most part he is better there is less erythema and less weeping. He has been on his feet for 12 hours o2 over the weekend. Using his compression pumps once a day 04/14/17 arrives today with continued improvement. Only one area on the posterior left calf that is not fully epithelialized. He has intense bilateral venous inflammation associated with his chronic venous insufficiency disease and secondary lymphedema. We have been using silver alginate to the left posterior calf wound In passing he tells [place] today that the right leg but we have not seen in quite some time has an open area on it but he doesn't want [place] to look at this today states he will show this to [place] next week. 04/21/17; there is no open area on his left leg although he still reports some weeping edema. He showed [place] his right leg today which is the first time we've seen this leg in a long time. He has a large area of open wound on the right leg anteriorly healthy granulation. Quite a bit of swelling in the right leg and some degree of venous inflammation.  He told us about the right leg in passing last week but states that deterioration in the right leg really only happened over the  weekend 04/28/17; there is no open area on the left leg although there is an irritated part on the posterior which is like a wrap injury. The wound on the right leg which was new from last week at least to [place] is a lot better. 05/05/17; still no open area on the left leg. Patient is using his new compression stocking which seems to be doing a good job of controlling the edema. He states he is using his compression pumps once per day. The right leg still has an open wound although it is better in terms of surface area. Required debridement. A lot of pain in the posterior right Achilles marked tenderness. Usually this type of presentation this patient gives concern for an active cellulitis 05/12/17; patient arrives today with his major wound from last week on the right lateral leg somewhat better. Still requiring debridement. He was using his compression stocking on the left leg however that is reopened with superficial wounds anteriorly he did not have an open wound on this leg previously. He is still using his juxta light's once daily at night. He cannot find the time to do this in the morning as he has to be at work by 7 AM 05/19/17; right lateral leg wound looks improved. No debridement required. The concerning area is on the left posterior leg which appears to almost have a subcutaneous hemorrhagic component to it. We've been using silver alginate to all the wounds 05/26/17; the right lateral leg wound continues to look improved. However the area on the left posterior calf is a tightly adherent surface. [place] using silver alginate. Because of the weeping edema in his legs there is very little good alternatives. 06/02/17; the patient left here last week looking quite good. Major wound on the left posterior calf and a small one on the right lateral calf. Both of these look satisfactory. He tells me that by Wednesday he had noted increased pain in the left leg and drainage. He called on Thursday and  Friday to get an appointment here but we were blocked. He did not go to urgent care or his primary physician. He thinks he had a fever on Thursday but did not actually take his temperature. He has not been using his compression pumps on the left leg because of pain. I advised him to go to the emergency room today for IV antibiotics for stents of left leg cellulitis but he has refused I have asked him to take 2 days off work to keep his leg elevated and he has refused this as well. In view of this I'm going to call him and Augmentin and doxycycline. 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 However after a detailed discussion regarding the risk benefits and alternatives he refuses to get admitted to the hospital. With no other recourse I will continue him on oral antibiotics as before and hopefully he'll have his infectious disease consultation this week. 06/16/2017 -- the patient was seen today by the nurse practitioner at infectious disease Ms. Dixon. Her review noted recurrent cellulitis of the lower extremity with tinea pedis of the  left foot and she has recommended clindamycin 150 mg daily for now and she may increase it to 300 mg daily to cover staph and Streptococcus. He has also been advise Lotrimin cream locally. she also had wise IV antibiotics for his condition if it flares up 06/23/17; patient arrives today with drainage bilaterally although the remaining wound on the left posterior calf after cleaning up today "highlighter yellow drainage" did not look too bad. Unfortunately he has had breakdown on the right anterior leg [previously this leg had not been open and he is using a black stocking] he went to see infectious disease and is been put on  clindamycin 150 mg daily, I did not verify the dose although I'm not familiar with using clindamycin in this dosing range, perhaps for prophylaxisoo 06/27/17; I brought this patient back today to follow-up on the wound deterioration on the right lower leg together with surrounding cellulitis. I started him on doxycycline 4 days ago. This area looks better however he comes in today with intense cellulitis on the medial part of his left thigh. This is not have a wound in this area. Extremely tender. We've been using silver alginate to the wounds on the right lower leg left lower leg with bilateral 4 layer compression he is using his external compression pumps once a day 07/04/17; patient's left medial thigh cellulitis looks better. He has not been using his compression pumps as his insert said it was contraindicated with cellulitis. His right leg continues to make improvements all the wounds are still open. We only have one remaining wound on the left posterior calf. Using silver alginate to all open areas. He is on doxycycline which I started a week ago and should be finishing I gave him Augmentin after Thursday's visit for the severe cellulitis on the left medial thigh which fortunately looks better 07/14/17; the patient's left medial thigh cellulitis has resolved. The cellulitis in his right lower calf on the right also looks better. All of his wounds are stable to improved we've been using silver alginate he has completed the antibiotics I have given him. He has clindamycin 150 mg once a day prescribed by infectious disease for prophylaxis, I've advised him to start this now. We have been using bilateral Unna boots over silver alginate to the wound areas 07/21/17; the patient is been to see infectious disease who noted his recurrent problems with cellulitis. He was not able to tolerate prophylactic clindamycin therefore he is on amoxicillin 500 twice a day. He also had a second daily dose of Lasix  added By Dr. Oneta Rack but he is not taking this. Nor is he being completely compliant with his compression pumps a especially not this week. He has 2 remaining wounds one on the right posterior lateral lower leg and one on the left posterior medial lower leg. 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 The patient has Unna boots bilaterally. 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 He has bilateral Unna boots for compression and silver alginate to his wounds. Arrives today with his legs looking as good as I have seen him in quite some time. Not surprisingly his wounds look better as well with improvement on the right lateral leg venous insufficiency wound and also the left medial leg. He is still  using the compression pumps once a day 08/11/17; both legs appear to be doing better wounds on the right lateral and left medial legs look better. Skin on the right leg quite good. He is been using silver alginate as the primary dressing. I'm going to use Anasept gel calcium alginate and maintain all the secondary dressings 08/18/17; the patient continues to actually do quite well. The area on his right lateral leg is just about closed the left medial also looks better although it is still moist in this area. His edema is well controlled we have been using Anasept gel with calcium alginate and the usual secondary dressings, 4 layer compression and once daily use of his compression pumps "always been able to manage 09/01/17; the patient continues to do reasonably well in spite of his trip to [place]. The area on the right lateral leg is epithelialized. Left is much better but still open. He has more edema and more chronic erythema on the left leg [venous inflammation] 09/08/17; he  arrives today with no open wound on the right lateral leg and decently controlled edema. Unfortunately his left leg is not nearly as in his good situation as last week.he apparently had increasing edema starting on Saturday. He edema soaked through into his foot so used a plastic bag to walk around his home. The area on the medial right leg which was his open area is about the same however he has lost surface epithelium on the left lateral which is new and he has significant pain in the Achilles area of the left foot. He is already on amoxicillin chronically for prophylaxis of cellulitis in the left leg 09/15/17; he is completed a week of doxycycline and the cellulitis in the left posterior leg and Achilles area is as usual improved. He still has a lot of edema and fluid soaking through his dressings. There is no open wound on the right leg. He saw infectious disease NP today 09/22/17;As usual 1 we transition him from our compression wraps to his stockings things did not go well. He has several small open areas on the right leg. He states this was caused by the compression wrap on his skin although he did not wear this with the stockings over them. He has several superficial areas on the left leg medially laterally posteriorly. He does not have any evidence of active cellulitis especially involving the left Achilles [facility] [place] He states he isn't attempting to get an appointment with a heel objects wound center there to change his dressings. I am not completely certain whether this will work 10/06/17; the patient came in on Friday for a nurse visit and the nurse reported that his legs actually look quite good. He arrives in clinic today for his regular follow-up visit. He has a new wound on his left third toe over the PIP probably caused by friction with his footwear. He has small areas on the left leg and a very superficial  but epithelialized area on the right anterior lateral lower leg. Other than that his legs look as good as I've seen him in quite some time. We have been using silver alginate Review of systems; no chest pain no shortness of breath other than this a 10 point review of systems negative 10/20/17; seen by Dr. Meyer RusselBritto last week. He had taken some antibiotics [doxycycline] that he had left over. Dr. Meyer RusselBritto thought he had candida infection and declined to give him further  antibiotics. He has a small wound remaining on the right lateral leg several areas on the left leg including a larger area on the left posterior several left medial and anterior and a small wound on the left lateral. The area on the left dorsal third toe looks a lot better. ROS; Gen.; no fever, respiratory no cough no sputum Cardiac no chest pain other than this 10 point review of system is negative 10/30/17; patient arrives today having fallen in the bathtub 3 days ago. It took him a while to get up. He has pain and maceration in the wounds on his left leg which have deteriorated. He has not been using his pumps he also has some maceration on the right lateral leg. 11/03/17; patient continues to have weeping edema especially in the left leg. This saturates his dressings which were just put on on 12/27. As usual the doxycycline seems to take care of the cellulitis on his lower leg. He is not complaining of fever, chills, or other systemic symptoms. He states his leg feels a lot better on the doxycycline I gave him empirically. He also apparently gets injections at his primary doctor's officeo Rocephin for cellulitis prophylaxis. I didn't ask him about his compression pump compliance today I think that's probably marginal. Arrives in the clinic with all of his dressings primary and secondary macerated full of fluid and he has bilateral edema 11/10/17; the patient's right leg looks some better although there is still a cluster of wounds on  the right lateral. The left leg is inflamed with almost circumferential skin loss medially to laterally although we are still maintaining anteriorly. He does not have overt cellulitis there is a lot of drainage. He is not using compression pumps. We have been using silver alginate to the wound areas, there are not a lot of options here 11/17/17; the patient's right leg continues to be stable although there is still open wounds, better than last week. The inflammation in the left leg is better. Still loss of surface layer epithelium especially posteriorly. There is no overt cellulitis in the amount of edema and his left leg is really quite good, tells me he is using his compression pumps once a day. 11/24/17; patient's right leg has a small superficial wound laterally this continues to improve. The inflammation in the left leg is still improving however we have continuous surface layer epithelial loss posteriorly. There is no overt cellulitis in the amount of edema in both legs is really quite good. He states he is using his compression pumps on the left leg once a day for 5 out of 7 days 12/01/17; very small superficial areas on the right lateral leg continue to improve. Edema control in both legs is better today. He has continued loss of surface epithelialization and left posterior calf although I think this is better. We have been using silver alginate with large number of absorptive secondary dressings 4 layer on the left Unna boot on the right at his request. He tells me he is using his compression pumps once a day 12/08/17; he has no open area on the right leg is edema control is good here. ooOn the left leg however he has marked erythema and tenderness breakdown of skin. He has what appears to be a wrap injury just distal to the popliteal fossa. This is the pattern of his recurrent cellulitis area and he apparently received penicillin at his primary physician's office really worked in my view  but usually response to  doxycycline given it to him several times in the past 12/15/17; the patient had already deteriorated last Friday when he came in for his nurse check. There was swelling erythema and breakdown in the right leg. He has much worse skin breakdown in the left leg as well multiple open areas medially and posteriorly as well as laterally. He tells me he has been using his compression pumps but tells me he feels that the drainage out of his leg is worse when he uses a compression pumps. To be fair to him he is been saying this for a while however I don't know that I have really been listening to this. I wonder if the compression pumps are working properly 12/22/17;. Once again he arrives with severe erythema, weeping edema from the left greater than right leg. Noncompliance with compression pumps. New this visit he is complaining of pain on the lateral aspect of the right leg and the medial aspect of his right thigh. He apparently saw his cardiologist Dr. [name] who was ordered an echocardiogram area and I think this is a step in the right direction 12/25/17; started his doxycycline Monday night. There is still intense erythema of the right leg especially in the anterior thigh although there is less tenderness. The erythema around the wound on the right lateral calf also is less tender. He still complaining of pain in the left heel. His wounds are about the same right lateral left medial left lateral. Superficial but certainly not close to closure. He denies being systemically unwell no fever chills no abdominal pain no diarrhea 12/29/17; back in follow-up of his extensive right calf and right thigh cellulitis. I added amoxicillin to cover possible doxycycline resistant strep. This seems to of done the trick he is in much less pain there is much less erythema and swelling. He has his echocardiogram at 11:00 this morning. X-ray of the left heel was also negative. 01/05/18; the patient  arrived with his edema under much better control. Now that he is retired he is able to use his compression pumps daily and sometimes twice a day per the patient. He has a wound on the right leg the lateral wound looks better. Area on the left leg also looks a lot better. He has no evidence of cellulitis in his bilateral thighs I had a quick peak at his echocardiogram. He is in normal ejection fraction and normal left ventricular function. He has moderate pulmonary hypertension moderately reduced right ventricular function. One would have to wonder about chronic sleep apnea although he says he doesn't snore. He'll review the echocardiogram with his cardiologist. 01/12/18; the patient arrives with the edema in both legs under exemplary control. He is using his compression pumps daily and sometimes twice daily. His wound on the right lateral leg is just about closed. He still has some weeping areas on the posterior left calf and lateral left calf although everything is just about closed here as well. I have spoken with Aldean Baker who is the patient's nurse practitioner and infectious disease. She was concerned that the patient had not understood that the parenteral penicillin injections he was receiving for cellulitis prophylaxis was actually benefiting him. I don't think the patient actually saw that I would tend to agree we were certainly dealing with less infections although he had a serious one last month. 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 He is healed. He'll be placed in juxtalite compression wraps and increase his lymphedema pumps  to twice daily. We will follow up again next week to ensure there are no issues with the new regiment. 01/20/18-he is here for evaluation of bilateral lower extremity weeping edema. Yesterday he was placed in compression wrap to the right lower extremity and compression stocking to left lower shrubbery. He states he  uses lymphedema pumps last night and again this morning and noted a blister to the left lower extremity. On exam he was noted to have drainage to the right lower extremity. He will be placed in Unna boots bilaterally and follow-up next week 01/26/18; patient was actually discharged a week ago to his own juxta light stockings only to return the next day with bilateral lower extremity weeping edema.he was placed in bilateral Unna boots. He arrives today with pain in the back of his left leg. There is no open area on the right leg however there is a linear/wrap injury on the left leg and weeping edema on the left leg posteriorly. I spoke with infectious disease about 10 days ago. They were disappointed that the patient elected to discontinue prophylactic intramuscular penicillin shots as they felt it was particularly beneficial in reducing the frequency of his cellulitis. I discussed this with the patient today. He does not share this view. He'll definitely need antibiotics today. Finally he is traveling to [place] and trauma leaving this Saturday and returning a week later and he does not travel with his pumps. He is going by car 01/30/18; patient was seen 4 days ago and brought back in today for review of cellulitis in the left leg posteriorly. I put him on amoxicillin this really hasn't helped as much as I might like. He is also worried because he is traveling to [facility] by car. Finally we will be rewrapping him. There is no open area on the right leg over his left leg has multiple weeping areas as usual 02/09/18; The same wrap on for 10 days. He did not pick up the last doxycycline I prescribed for him. He apparently took 4 days worth he already had. There is nothing open on his right leg and the edema control is really quite good. He's had damage in the left leg medially and laterally especially probably related to the prolonged use of Unna boots 02/12/18; the patient arrived in clinic  today for a nurse visit/wrap change. He complained of a lot of pain in the left posterior calf. He is taking doxycycline that I previously prescribed for him. Unfortunately even though he used his stockings and apparently used to compression pumps twice a day he has weeping edema coming out of the lateral part of his right leg. This is coming from the lower anterior lateral skin area. 02/16/18; the patient has finished his doxycycline and will finish the amoxicillin 2 days. The area of cellulitis in the left calf posteriorly has resolved. He is no longer having any pain. He tells me he is using his compression pumps at least once a day sometimes twice. 02/23/18; the patient finished his doxycycline and Amoxil last week. On Friday he noticed a small erythematous circle about the size of a quarter on the left lower leg just above his ankle. This rapidly expanded and he now has erythema on the lateral and posterior part of the thigh. This is bright red. Also has an area on the dorsal foot just above his toes and a tender area just below the left popliteal fossa. He came off his prophylactic penicillin injections at his own insistence  one or 2 months ago. This is obviously deteriorated since then 03/02/18; patient is on doxycycline and Amoxil. Culture I did last week of the weeping area on the back of his left calf grew group B strep. I have therefore renewed the amoxicillin 500 3 times a day for a further week. He has not been systemically unwell. Still complaining of an area of discomfort right under his left popliteal fossa. There is no open wound on the right leg. He tells me that he is using his pumps twice a day on most days 03/09/18; patient arrives in clinic today completing his amoxicillin today. The cellulitis on his left leg is better. Furthermore he tells me that he had intramuscular penicillin shots that his primary care office today. However he also states that the wrap on his right leg fell  down shortly after leaving clinic last week. He developed a large blister that was present when he came in for a nurse visit later in the week and then he developed intense discomfort around this area.He tells me he is using his compression pumps 03/16/18; the patient has completed his doxycycline. The infectious part of this/cellulitis in the left heel area left popliteal area is a lot better. He has 2 open areas on the right calf. Still areas on the left calf but this is a lot better as well. 03/24/18; the patient arrives complaining of pain in the left popliteal area again. He thinks some of this is wrap injury. He has no open area on the right leg and really no open area on the left calf either except for the popliteal area. He claims to be compliant with the compression pumps 03/31/18; I gave him doxycycline last week because of cellulitis in the left popliteal area. This is a lot better although the surface epithelium is denuded off and response to this. He arrives today with uncontrolled edema in the right calf area as well as a fingernail injury in the right lateral calf. There is only a few open areas on the left 04/06/18; I gave him amoxicillin doxycycline over the last 2 [name] that the amoxicillin should be completing currently. He is not complaining of any pain or systemic symptoms. The only open areas see has is on the right lateral lower leg paradoxically I cannot see anything on the left lower leg. He tells me he is using his compression pumps twice a day on most days. Silver alginate to the wounds that are open under 4 layer compression 04/13/18; he completed antibiotics and has no new complaints. Using his compression pumps. Silver alginate that anything that's opened 04/20/18; he is using his compression pumps religiously. Silver alginate 4 layer compression anything that's opened. He comes in today with no open wounds on the left leg but 3 on the right including a new one posteriorly.  He has 2 on the right lateral and one on the right posterior. He likes Unna boots on the right leg for reasons that aren't really clear we had the usual 4 layer compression on the left. It may be necessary to move to the 4 layer compression on the right however for now I left them in the Unna boots 04/27/18; he is using his compression pumps at least once a day. He has still the wounds on the right lateral calf. The area right posteriorly has closed. He does not have an open wound on the left under 4 layer compression however on the dorsal left foot just proximal to the  toes and the left third toe 2 small open areas were identified 05/11/18; he has not uses compression pumps. The areas on the right lateral calf have coalesced into one large wound necrotic surface. On the left side he has one small wound anteriorly however the edema is now weeping out of a large part of his left leg. He says he wasn't using his pumps because of the weeping fluid. I explained to him that this is the time he needs to pump more 05/18/18; patient states he is using his compression pumps twice a day. The area on the right lateral large wound albeit superficial. On the left side he has innumerable number of small new wounds on the left calf particularly laterally but several anteriorly and medially. All these appear to have healthy granulated base these look like the remnants of blisters however they occurred under compression. The patient arrives in clinic today with his legs somewhat better. There is certainly less edema, less multiple open areas on the left calf and the right anterior leg looks somewhat better as well superficial and a little smaller. However he relates pain and erythema over the last 3-4 days in the thigh and I looked at this today. He has not been systemically unwell no fever no chills no change in blood sugar values 05/25/18; comes in today in a better state. The severe cellulitis on his left leg seems  better with the Keflex. Not as tender. He has not been systemically unwell ooHard to find an open wound on the left lower leg using his compression pumps twice a day ooThe confluent wounds on his right lateral calf somewhat better looking. These will ultimately need debridement I didn't do this today. 06/01/18; the severe cellulitis on the left anterior thigh has resolved and he is completed his Keflex. ooThere is no open wound on the left leg however there is a superficial excoriation at the base of the third toe dorsally. Skin on the bottom of his left foot is macerated looking. ooThe left the wounds on the lateral right leg actually looks some better although he did require debridement of the top half of this wound area with an open curet 06/09/18 on evaluation today patient appears to be doing poorly in regard to his right lower extremity in particular this appears to likely be infected he has very thick purulent discharge along with a bright green tent to the discharge. This makes me concerned about the possibility of pseudomonas. He's also having increased discomfort at this point on evaluation. Fortunately there does not appear to be any evidence of infection spreading to the other location at this time. 06/16/18 on evaluation today patient appears to actually be doing fairly well. His ulcer has actually diminished in size quite significantly at this point which is good news. Nonetheless he still does have some evidence of infection he did see infectious disease this morning before coming here for his appointment. I did review the results of their evaluation and their note today. They did actually have him discontinue the Cipro and initiate treatment with linezolid at this time. He is doing this for the next seven days and they recommended a follow-up in four months with them. He is the keep a log of the need for intermittent antibiotic therapy between now and when he falls back up  with infectious disease. This will help them gaze what exactly they need to do to try and help them out. 06/23/18; the patient arrives today with no open  wounds on the left leg and left third toe healed. He is been using his compression pumps twice a day. On the right lateral leg he still has a sizable wound but this is a lot better than last time I saw this. 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 Has been using silver alginate under 4 layer compression 06/30/18; the only open wound he has is on the right lateral leg and this looks healthy. No debridement is required. We have been using silver alginate. He does not have an open wound on the left leg. There is apparently some drainage from the dorsal proximal third toe on the left although I see no open wound here. 07/03/18 on evaluation today patient was actually here just for a nurse visit rapid change. However when he was here on Wednesday for his rat change due to having been healed on the left and then developing blisters we initiated the wrap again knowing that he would be back today for [place] to reevaluate and see were at. Unfortunately he has developed some cellulitis into the proximal portion of his right lower extremity even into the region of his thigh. 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 He was placed on one as what at that point. Nonetheless he is done with that and has been tolerating it well otherwise. Doxycycline which in the past really did not seem to be effective for him. Nonetheless I think the best option may be for [place] to definitely reinitiate the antibiotics for a longer period of time. 07/07/18; since I last saw this patient a week ago he has had a difficult time. At that point he did not have an open wound on his left leg. We transitioned him into juxta light stockings. He was  apparently in the clinic the next day with blisters on the left lateral and left medial lower calf. He also had weeping edema fluid. He was put back into a compression wrap. He was also in the clinic on Friday with intense erythema in his right thigh. Per the patient he was started on Bactrim however that didn't work at all in terms of relieving his pain and swelling. He has taken 3 doxycycline that he had left over from last time and that seems to of helped. He has blistering on the right thigh as well. 07/14/18; the erythema on his right thigh has gotten better with doxycycline that he is finishing. 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 Presumably this cellulitis in the thigh was not related to that although I think this is a bit concerning going forward. He still has an area on the right lateral calf the blister on the right medial calf just below the knee that was discussed above. On the left 2 small open areas left medial and left lateral. Edema control is adequate. He is using his compression pumps twice a day 07/20/18; continued improvement in the condition of both legs especially the edema in his bilateral thighs. He tells me he is been losing weight through a combination of diet and exercise. He is using his compression pumps twice a day. So overall she made to the remaining wounds 07/27/2018; continued improvement in condition of both legs. His edema is well controlled. The area on  the right lateral leg is just about closed he had one blisters show up on the medial left upper calf. We have him in 4 layer compression. He is going on a 10-day trip to [place], [place] and [place]. He will be driving. He wants to wear Unna boots because of the lessening amount of constriction. He will not use compression pumps while he is away 08/05/18 on evaluation today patient actually appears to be doing decently well all  things considered in regard to his bilateral lower extremities. The worst ulcer is actually only posterior aspect of his left lower extremity with a four layer compression wrap cut into his leg a couple [name] back. He did have a trip and actually had [employer] for the trip that he is worn since he was last here. Nonetheless he feels like the [employer] actually do better for him his swelling is up a little bit but he also with his trip was not taking his Lasix on a regular set schedule like he was supposed to be. He states that obviously the reason being that he cannot drive and keep going without having to urinate too frequently which makes it difficult. He did not have his pumps with him while he was away either which I think also maybe playing a role here too. 08/13/2018; the patient only has a small open wound on the right lateral calf which is a big improvement in the last month or 2. He also has the area posteriorly just below the posterior fossa on the left which I think was a wrap injury from several [name] ago. He has no current evidence of cellulitis. He tells me he is back into his compression pumps twice a day. He also tells me that while he was at the laundromat somebody stole a section of his extremitease stockings 08/20/2018; back in the clinic with a much improved state. He only has small areas on the right lateral mid calf which is just about healed. This was is more substantial area for quite a prolonged period of time. He has a small open area on the left anterior tibia. The area on the posterior calf just below the popliteal fossa is closed today. He is using his compression pumps twice a day 08/28/2018; patient has no open wound on the right leg. He has a smattering of open areas on the calf with some weeping lymphedema. More problematically than that it looks as though his wraps of slipped down in his usual he has very angry upper area of edema just below the right  medial knee and on the right lateral calf. He has no open area on his feet. The patient is traveling to [facility] next week. I will send him in an antibiotic. We will continue to wrap the right leg. We ordered extremitease stockings for him last week and I plan to transition the right leg to a stocking when he gets home which will be in 10 days time. As usual he is very reluctant to take his pumps with him when he travels 09/07/2018; patient returns from [facility]. He shows me a picture of his left leg in the mid part of his trip last week with intense fire engine erythema. The picture look bad enough I would have considered sending him to the hospital. Instead he went to the wound care center in [facility]. They did not prescribe him antibiotics but he did take some doxycycline he had leftover from  a previous visit. I had given him trimethoprim sulfamethoxazole before he left this did not work according to the patient. This is resulted in some improvement fortunately. He comes back with a large wound on the left posterior calf. Smaller area on the left anterior tibia. Denuded blisters on the dorsal left foot over his toes. Does not have much in the way of wounds on the right leg although he does have a very tender area on the right posterior area just below the popliteal fossa also suggestive of infection. He promises me he is back on his pumps twice a day 09/15/2018; the intense cellulitis in his left lower calf is a lot better. The wound area on the posterior left calf is also so better. However he has reasonably extensive wounds on the dorsal aspect of his second and third toes and the proximal foot just at the base of the toes. There is nothing open on the right leg 09/22/2018; the patient has excellent edema control in his legs bilaterally. He is using his external compression pumps twice a day. He has no open area on the right leg and only the areas in the left foot dorsally  second and third toe area on the left side. He does not have any signs of active cellulitis. 10/06/2018; the patient has good edema control bilaterally. He has no open wound on the right leg. There is a blister in the posterior aspect of his left calf that we had to deal with today. He is using his compression pumps twice a day. There is no signs of active cellulitis. We have been using silver alginate to the wound areas. He still has vulnerable areas on the base of his left first second toes dorsally He has a his extremities stockings and we are going to transition him today into the stocking on the right leg. He is cautioned that he will need to continue to use the compression pumps twice a day. If he notices uncontrolled edema in the right leg he may need to go to 3 times a day. 10/13/2018; the patient came in for a nurse check on Friday he has a large flaccid blister on the right medial calf just below the knee. We unroofed this. He has this and a new area underneath the posterior mid calf which was undoubtedly a blister as well. He also has several small areas on the right which is the area we put his extremities stocking on. 10/19/2018; the patient went to see infectious disease this morning I am not sure if that was a routine follow-up in any case the doxycycline I had given him was discontinued and started on linezolid. He has not started this. It is easy to look at his left calf and the inflammation and think this is cellulitis however he is very tender in the tissue just below the popliteal fossa and I have no doubt that there is infection going on here. He states the problem he is having is that with the compression pumps the edema goes down and then starts walking the wrap falls down. We will see if we can adhere this. He has 1 or 2 minuscule open areas on the right still areas that are weeping on the posterior left calf, the base of his left second and third toes 10/26/18; back today  in clinic with quite of skin breakdown in his left anterior leg. This may have been infection the area below the popliteal fossa seems a lot better however tremendous  epithelial loss on the left anterior mid tibia area over quite inexpensive tissue. He has 2 blisters on the right side but no other open wound here. 10/29/2018; came in urgently to see [place] today and we worked him in for review. He states that the 4 layer compression on the right leg caused pain he had to cut it down to roughly his mid calf this caused swelling above the wrap and he has blisters and skin breakdown today. As a result of the pain he has not been using his pumps. Both legs are a lot more edematous and there is a lot of weeping fluid. 11/02/18; arrives in clinic with continued difficulties in the right leg> left. Leg is swollen and painful. multiple skin blisters and new open areas especially laterally. He has not been using his pumps on the right leg. 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 He is not systemically unwell. 11/09/2018; the patient claims he is being compliant with his pumps. He is finished the doxycycline I gave him last week. Culture I did of the wound on the right lateral leg showed a few very resistant methicillin staph aureus. This was resistant to doxycycline. Nevertheless he states the pain in the leg is a lot better which makes me wonder if the cultured organism was not really what was causing the problem nevertheless this is a very dangerous organism to be culturing out of any wound. His right leg is still a lot larger than the left. He is using an Radio broadcast assistant on this area, he blames a 4-layer compression for causing the original skin breakdown which I doubt is true however I cannot talk him out of it. We have been using silver alginate to all of these areas which were initially blisters 11/16/2018; patient is being compliant with his external compression  pumps at twice a day. Miraculously he arrives in clinic today with absolutely no open wounds. He has better edema control on the left where he has been using 4 layer compression versus wound of wounds on the right and I pointed this out to him. There is no inflammation in the skin in his lower legs which is also somewhat unusual for him. There is no open wounds on the dorsal left foot. He has extremitease stockings at home and I have asked him to bring these in next week. 11/25/18 patient's lower extremity on examination today on the left appears for the most part to be wound free. He does have an open wound on the lateral aspect of the right lower extremity but this is minimal compared to what I've seen in past. He does request that we go ahead and wrap the left leg as well even though there's nothing open just so hopefully it will not reopen in short order. 1/28; patient has superficial open wounds on the right lateral calf left anterior calf and left posterior calf. His edema control is adequate. 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 We have been using silver alginate as the primary dressing. He claims compliance with his compression pumps 2/4; patient has superficial open wounds on numerous areas of his left calf and again one on the left dorsal foot. The areas on the right lateral calf have healed. The cellulitis that I gave him doxycycline for last week is also resolved this was mostly on the left anterior calf just below  the tibial tuberosity. His edema looks fairly well-controlled. He tells me he went to see his primary doctor today and had blood work ordered 2/11; once again he has several open areas on the left calf left tibial area. Most of these are small and appear to have healthy granulation. He does not have anything open on the right. The edema and control in his thighs is pretty good which is  usually a good indication he has been using his pumps as requested. 2/18; he continues to have several small areas on the left calf and left tibial area. Most of these are small healthy granulation. We put him in his stocking on the right leg last week and he arrives with a superficial open area over the right upper tibia and a fairly large area on the right lateral tibia in similar condition. His edema control actually does not look too bad, he claims to be using his compression pumps twice a day 2/25. Continued small areas on the left calf and left tibial area. New areas especially on the right are identified just below the tibial tuberosity and on the right upper tibia itself. There are also areas of weeping edema fluid even without an obvious wound. He does not have a considerable degree of lymphedema but clearly there is more edema here than his skin can handle. He states he is using the pumps twice a day. We have an Unna boot on the right and 4 layer compression on the left. 3/3; he continues to have an area on the right lateral calf and right posterior calf just below the popliteal fossa. There is a fair amount of tenderness around the wound on the popliteal fossa but I did not see any evidence of cellulitis, could just be that the wrap came down and rubbed in this area. ooHe does not have an open area on the left leg however there is an area on the left dorsal foot at the base of the third toe ooWe have been using silver alginate to all wound areas 3/10; he did not have an open area on his left leg last time he was here a week ago. Today he arrives with a horizontal wound just below the tibial tuberosity and an area on the left lateral calf. He has intense erythema and tenderness in this area. The area is on the right lateral calf and right posterior calf better than last week. We have been using silver alginate as usual 3/18 - Patient returns with 3 small open areas on left calf, and 1  small open area on right calf, the skin looks ok with no significant erythema, he continues the UNA boot on right and 4 layer compression on left. The right lateral calf wound is closed , the right posterior is small area. we will continue silver alginate to the areas. 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 He is been tolerating the dressing changes without complication. Fortunately she has made excellent improvement in regard to the overall status of his wounds. Unfortunately every time we cease wrapping him he ends up reopening in causing more significant issues at that point. Again I'm unsure of the best direction to take although I think the lymphedema clinic may be appropriate for him. 02/03/19 on evaluation today patient appears to be doing well  in regard to the wounds that we saw him for last week unfortunately he has a new area on the proximal portion of his right medial/posterior lower extremity where the wrap somewhat slowed down and caused swelling and a blister to rub and open. Unfortunately this is the only opening that he has on either leg at this point. 02/17/19 on evaluation today patient's bilateral lower extremities appear to be doing well. He still completely healed in regard to the left lower extremity. In regard to the right lower extremity the area where the wrap and slid down and caused the blister still seems to be slightly open although this is dramatically better than during the last evaluation two [name] ago. I'm very pleased with the way this stands overall. 03/03/19 on evaluation today patient appears to be doing well in regard to his right lower extremity in general although he did have a new blister open this does not appear to be showing any evidence of active infection at  this time. Fortunately there's No fevers, chills, nausea, or vomiting noted at this time. Overall I feel like he is making good progress it does feel like that the right leg will we perform the [employer] seems to do with a bit better than three layer wrap on the left which slid down on him. We may switch to doing bilateral in the book wraps. 5/4; I have not seen Mr. Milsap in quite some time. According to our case manager he did not have an open wound on his left leg last week. He had 1 remaining wound on the right posterior medial calf. He arrives today with multiple openings on the left leg probably were blisters and/or wrap injuries from Unna boots. I do not think the Unna boot's will provide adequate compression on the left. I am also not clear about the frequency he is using the compression pumps. 03/17/19 on evaluation today patient appears to be doing excellent in regard to his lower extremities compared to last week's evaluation apparently. He had gotten significantly worse last week which is unfortunate. The [employer] wrap on the left did not seem to do very well for him at all and in fact it didn't control his swelling significantly enough he had an additional outbreak. Subsequently we go back to the four layer compression wrap on the left. This is good news. At least in that he is doing better and the wound seem to be killing him. He still has not heard anything from the lymphedema clinic. 03/24/19 on evaluation today patient actually appears to be doing much better in regard to his bilateral lower [place] as compared to last week when I saw him. Fortunately there's no signs of active infection at this time. He has been tolerating the dressing changes without complication. Overall I'm extremely pleased with the progress and appearance in general. 04/07/19 on evaluation today patient appears to be doing well in regard to his bilateral lower extremities. His swelling is significantly down  from where it was previous. With that being said he does have a couple blisters still open at this point but fortunately nothing that seems to be too severe and again the majority of the larger openings has healed at this time. 04/14/19 on evaluation today patient actually appears to be doing quite well in regard to his bilateral lower extremities in fact I'm not even sure there's anything significantly open at this time at any site. Nonetheless he did have some trouble with these wraps where  they are somewhat irritating him secondary to the fact that he has noted that the graph wasn't too close down to the end of this foot in a little bit short as well up to his knee. Otherwise things seem to be doing quite well. 04/21/19 upon evaluation today patient's wound bed actually showed evidence of being completely healed in regard to both lower extremities which is excellent news. There does not appear to be any signs of active infection which is also good news. I'm very pleased in this regard. No fevers, chills, nausea, or vomiting noted at this time. 04/28/19 on evaluation today patient appears to be doing a little bit worse in regard to both lower extremities on the left mainly due to the fact that when he went infection disease the wrap was not wrapped quite high enough he developed a blister above this. On the right he is a small open area of nothing too significant but again this is continuing to give him some trouble he has been were in the Velcro compression that he has at home. 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 He's been tolerating the dressing changes without complication. Fortunately there's no signs of active infection at this time. No fevers, chills, nausea, or vomiting noted at this time. We have been trying to get an appointment with her lymphedema clinic in [facility] but unfortunately nobody can get them  on phone with not been able to even fax information over the patient likewise is not been able to get in touch with them. Overall I'm not sure exactly what's going on here with to reach out again today. 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 Still having a lot of drainage unfortunately. He tells me especially in the left but even on the right. There's no signs of active infection which is good news we've been using so ratcheted up to this point. 05/19/19 on evaluation today patient actually appears to be doing quite well with regard to his left lower extremity which is great news. Fortunately in regard to the right lower extremity has an issues with his wrap and he subsequently did remove this from what I'm understanding. Nonetheless long story short is what he had rewrapped once he removed it subsequently had maggots underneath this wrap whenever he came in for evaluation today. With that being said they were obviously completely cleaned away by the nursing staff. The visit today which is excellent news. However he does appear to potentially have some infection around the right ankle region where the maggots were located as well. He will likely require anabiotic therapy today. 05/26/19 on evaluation today patient actually appears to be doing much better in regard to his bilateral lower extremities. I feel like the infection is under much better control. With that being said there were maggots noted when the wrap was removed yet again today. Again this could have potentially been left over from previous although at this time there does not appear to be any signs of significant drainage there was obviously on the wrap some drainage as well this contracted gnats or otherwise. Either way I do not see anything that appears to be doing worse in my pinion and in fact I think his drainage has slowed down quite  significantly likely mainly due to the fact to his infection being under better control. 06/02/2019  on evaluation today patient actually appears to be doing well with regard to his bilateral lower extremities there is no signs of active infection at this time which is great news. With that being said he does have several open areas more so on the right than the left but nonetheless these are all significantly better than previously noted. 06/09/2019 on evaluation today patient actually appears to be doing well. His wrap stayed up and he did not cause any problems he had more drainage on the right compared to the left but overall I do not see any major issues at this time which is great news. 06/16/2019 on evaluation today patient appears to be doing excellent with regard to his lower extremities the only area that is open is a new blister that can have opened as of today on the medial ankle on the left. Other than this he really seems to be doing great I see no major issues at this point. 06/23/2019 on evaluation today patient appears to be doing quite well with regard to his bilateral lower extremities. In fact he actually appears to be almost completely healed there is a small area of weeping noted of the right lower extremity just above the ankle. Nonetheless fortunately there is no signs of active infection at this time which is good news. No fevers, chills, nausea, vomiting, or diarrhea. 8/24; the patient arrived for a nurse visit today but complained of very significant pain in the left leg and therefore I was asked to look at this. Noted that he did not have an open area on the left leg last week nevertheless this was wrapped. The patient states that he is not been able to put his compression pumps on the left leg because of the discomfort. He has not been systemically unwell 06/30/2019 on evaluation today patient unfortunately despite being excellent last week is doing much worse with regard to  his left lower extremity today. In fact he had to come in for a nurse on Monday where his left leg had to be rewrapped due to excessive weeping Dr. Leanord Hawking placed him on doxycycline at that point. Fortunately there is no signs of active infection Systemically at this time which is good news. 07/07/2019 in regard to the patient's wounds today he actually seems to be doing well with his right lower extremity there really is nothing open or draining at this point this is great news. Unfortunately the left lower extremity is given him additional trouble at this time. There does not appear to be any signs of active infection nonetheless he does have a lot of edema and swelling noted at this point as well as blistering all of which has led to a much more poor appearing leg at this time compared to where it was 2 [name] ago when it was almost completely healed. Obviously this is a little discouraging for the patient. He is try to contact the lymphedema clinic in [place] he has not been able to get through to them. 07/14/2019 on evaluation today patient actually appears to be doing slightly better with regard to his left lower extremity ulcers. Overall I do feel like at least at the top of the wrap that we have been placing this area has healed quite nicely and looks much better. The remainder of the leg is showing signs of improvement. Unfortunately in the thigh area he still has an open region on the left and again on the right he has been utilizing just a Band-Aid  on an area that also opened on the thigh. Again this is an area that were not able to wrap although we did do an Ace wrap to provide some compression that something that obviously is a little less effective than the compression wraps we have been using on the lower portion of the leg. He does have an appointment with the lymphedema clinic in [facility] on Friday. 07/21/2019 on evaluation today patient appears to be doing better with regard to  his lower extremity ulcers. He has been tolerating the dressing changes without complication. Fortunately there is no signs of active infection at this time. No fevers, chills, nausea, vomiting, or diarrhea. I did receive the paperwork from the physical therapist at the lymphedema clinic in [place]. Subsequently I signed off on that this morning and sent that back to him for further progression with the treatment plan. 07/28/2019 on evaluation today patient appears to be doing very well with regard to his right lower extremity where I do not see any open wounds at this point. Fortunately he is feeling great as far as that is concerned as well. In regard to the left lower extremity he has been having issues with still several areas of weeping and edema although the upper leg is doing better his lower leg still I think is going require the compression wrap at this time. No fevers, chills, nausea, vomiting, or diarrhea. 08/04/2019 on evaluation today patient unfortunately is having new wounds on the right lower extremity. Again we have been using Unna boot wrap on that side. We switched him to using his juxta lite wrap at home. With that being said he tells me he has been using it although his legs extremely swollen and to be honest really does not appear that he has been. I cannot know that for sure however. Nonetheless he has multiple new wounds on the right lower extremity at this time. Obviously we will have to see about getting this rewrapped for him today. 08/11/2019 on evaluation today patient appears to be doing fairly well with regard to his wounds. He has been tolerating the dressing changes including the compression wraps without complication. He still has a lot of edema in his upper thigh regions bilaterally he is supposed to be seeing the lymphedema clinic on the 15th of this month once his wraps arrive for the upper part of his legs. 08/18/2019 on evaluation today patient appears to  be doing well with regard to his bilateral lower extremities at this point. He has been tolerating the dressing changes without complication. Fortunately there is no signs of active infection which is also good news. He does have a couple weeping areas on the first and second toe of the right foot he also has just a small area on the left foot upper leg and a small area on the left lower leg but overall he is doing quite well in my opinion. He is supposed to be getting his wraps shortly in fact tomorrow and then subsequently is seeing the lymphedema clinic next Wednesday on the 21st. Of note he is also leaving on the 25th to go on vacation for a week to the beach. For that reason and since there is some uncertainty about what there can be doing at lymphedema clinic next Wednesday I am get a make an appointment for next Friday here for [place] to see what we need to do for him prior to him leaving for vacation. 10/23; patient arrives in considerable pain predominantly in  the upper posterior calf just distal to the popliteal fossa also in the wound anteriorly above the major wound. This is probably cellulitis and he has had this recurrently in the past. He has no open wound on the right side and he has had an Radio broadcast assistant in that area. Finally I note that he has an area on the left posterior calf which by enlarge is mostly epithelialized. This protrudes beyond the borders of the surrounding skin in the setting of dry scaly skin and lymphedema. The patient is leaving for [facility] on Sunday. Per his longstanding pattern, he will not take his compression pumps with him predominantly out of fear that they will be stolen. He therefore asked that we put a Unna boot back on the right leg. He will also contact the wound care center in [facility] to see if they can change his dressing in the mid week. 11/3; patient returned from his vacation to [facility]. He was seen on 1 occasion at their wound care  center. They did a 2 layer compression system as they did not have our 4-layer wrap. I am not completely certain what they put on the wounds. They did not change the Unna boot on the right. The patient is also seeing a lymphedema specialist physical therapist in [place]. It appears that he has some compression sleeve for his thighs which indeed look quite a bit better than I am used to seeing. He pumps over these with his external compression pumps. 11/10; the patient has a new wound on the right medial thigh otherwise there is no open areas on the right. He has an area on the left leg posteriorly anteriorly and medially and an area over the left second toe. We have been using silver alginate. He thinks the injury on his thigh is secondary to friction from the compression sleeve he has. 11/17; the patient has a new wound on the right medial thigh last week. He thinks this is because he did not have a underlying stocking for his thigh juxta lite apparatus. He now has this. The area is fairly large and somewhat angry but I do not think he has underlying cellulitis. ooHe has a intact blister on the right anterior tibial area. ooSmall wound on the right great toe dorsally ooSmall area on the medial left calf. 11/30; the patient does not have any open areas on his right leg and we did not take his juxta lite stocking off. However he states that on Friday his compression wrap fell down lodging around his upper mid calf area. As usual this creates a lot of problems for him. He called urgently today to be seen for a nurse visit however the nurse visit turned into a provider visit because of extreme erythema and pain in the left anterior tibia extending laterally and posteriorly. The area that is problematic is extensive 10/06/2019 upon evaluation today patient actually appears to be doing poorly in regard to his left lower extremity. He Dr. Leanord Hawking did place him on doxycycline this past Monday  apparently due to the fact that he was doing much worse in regard to this left leg. Fortunately the doxycycline does seem to be helping. Unfortunately we are still having a very difficult time getting his edema under any type of control in order to anticipate discharge at some point. The only way were really able to control his lymphedema really is with compression wraps and that has only even seemingly temporary. He has been seeing  a lymphedema clinic they are trying to help in this regard but still this has been somewhat frustrating in general for the patient. 10/13/19 on evaluation today patient appears to be doing excellent with regard to his right lower extremity as far as the wounds are concerned. His swelling is still quite extensive unfortunately. He is still having a lot of drainage from the thigh areas bilaterally which is unfortunate. He's been going to lymphedema clinic but again he still really does not have this edema under control as far as his lower extremities are concern. With regard to his left lower extremity this seems to be improving and I do believe the doxycycline has been of benefit for him. He is about to complete the doxycycline. 10/20/2019 on evaluation today patient appears to be doing poorly in regard to his bilateral lower extremities. More in the right thigh he has a lot of irritation at this site unfortunately. In regard to the left lower extremity the wrap was not quite as high it appears and does seem to have caused him some trouble as well. Fortunately there is no evidence of systemic infection though he does have some blue-green drainage which has me concerned for the possibility of Pseudomonas. He tells me he is previously taking Cipro without complications and he really does not care for Levaquin however due to some of the side effects he has. He is not allergic to any medications specifically antibiotics that were aware of. 10/27/2019 on evaluation today  patient actually does appear to be for the most part doing better when compared to last week's evaluation. With that being said he still has multiple open wounds over the bilateral lower extremities. He actually forgot to start taking the Cipro and states that he still has the whole bottle. He does have several new blisters on left lower extremity today I think I would recommend he go ahead and take the Cipro based on what I am seeing at this point. 12/30-Patient comes at 1 week visit, 4 layer compression wraps on the left and Unna boot on the right, primary dressing Xtrasorb and silver alginate. Patient is taking his Cipro and has a few more days left probably 5-6, and the legs are doing better. He states he is using his compressions devices which I believe he has 11/10/2019 on evaluation today patient actually appears to be much better than last time I saw him 2 [name] ago. His wounds are significantly improved and overall I am very pleased in this regard. Fortunately there is no signs of active infection at this time. He is just a couple of days away from completing Cipro. Overall his edema is much better he has been using his lymphedema pumps which I think is also helping at this point. 11/17/2019 on evaluation today patient appears to be doing excellent in regard to his wounds in general. His legs are swollen but not nearly as much as they have been in the past. Fortunately he is tolerating the compression wraps without complication. No fevers, chills, nausea, vomiting, or diarrhea. He does have some erythema however in the distal portion of his right lower extremity specifically around the forefoot and toes there is a little bit of warmth here as well. 11/24/2019 on evaluation today patient appears to be doing well with regard to his right lower extremity I really do not see any open wounds at this point. His left lower extremity does have several open areas and his right medial thigh also is  open.  Other than this however overall the patient seems to be making good progress and I am very pleased at this point. 12/01/2019 on evaluation today patient appears to be doing poorly at this point in regard to his left lower extremity has several new blisters despite the fact that we have him in compression wraps. In fact he had a 4-layer compression wrap, his upper thigh wrapped from lymphedema clinic, and a juxta light over top of the 4 layer compression wrap the lymphedema clinic applied and despite all this he still develop blisters underneath. Obviously this does have me concerned about the fact that unfortunately despite what we are doing to try to get wounds healed he continues to have new areas arise I do not think he is ever good to be at the point where he can realistically just use wraps at home to keep things under control. Typically when we heal him it takes about 1-2 days before he is back in the clinic with severe breakdown and blistering of his lower extremities bilaterally. This is happened numerous times in the past. Unfortunately I think that we may need some help as far as overall fluid overload to kind of limit what we are seeing and get things under better control. 12/08/2019 on evaluation today patient presents for follow-up concerning his ongoing bilateral lower extremity edema. Unfortunately he is still having quite a bit of swelling the compression wraps are controlling this to some degree but he did see Dr. [name] his cardiologist I do have that available for review today as far as the appointment was concerned that was on 12/06/2019. Obviously that she has been 2 days ago. The patient states that he is only been taking the Lasix 80 mg 1 time a day he had told me previously he was taking this twice a day. Nonetheless Dr. [name] recommended this be up to 80 mg 2 times a day for the patient as he did appear to be fluid overloaded. With that being said the patient states he did  this yesterday and he was unable to go anywhere or do anything due to the fact that he was constantly having to urinate. Nonetheless I think that this is still good to be something that is important for him as far as trying to get his edema under control at all things that he is going to be able to just expect his wounds to get under control and things to be better without going through at least a period of time where he is trying to stabilize his fluid management in general and I think increasing the Lasix is likely the first step here. It was also mentioned the possibility that the patient may require metolazone. With that being said he wanted to have the patient take Lasix twice a day first and then reevaluating 2 months to see where things stand. 12/15/2019 upon evaluation today patient appears to be doing regard to his legs although his toes are showing some signs of weeping especially on the left at this point to some degree on the right. There does not appear to be any signs of active infection and overall I do feel like the compression wraps are doing well for him but he has not been able to take the Lasix at home and the increased dose that Dr. [name] recommended. He tells me that just not go to be feasible for him. Nonetheless I think in this case he should probably send a message to Dr. [name] in order  to discuss options from the standpoint of possible admission to get the fluid off or otherwise going forward. 12/22/2019 upon evaluation today patient appears to be doing fairly well with regard to his lower extremities at this point. In fact he would be doing excellent if it was not for the fact that his right anterior thigh apparently had an allergic reaction to adhesive tape that he used. The wound itself that we have been monitoring actually appears to be healed. There is a lot of irritation at this point. Objective Constitutional Well-nourished and well-hydrated in no acute  distress. Vitals Time Taken: 10:28 AM, Height: 70 in, Weight: 380.2 lbs, BMI: 54.5, Temperature: 98.2 F, Pulse: 63 bpm, Respiratory Rate: 18 breaths/min, Blood Pressure: 161/72 mmHg, Capillary Blood Glucose: 106 mg/dl. General Notes: glucose per pt report Respiratory normal breathing without difficulty. Psychiatric this patient is able to make decisions and demonstrates good insight into disease process. Alert and Oriented x 3. pleasant and cooperative. General Notes: Patient's lower extremities actually are doing quite well. I am very pleased with how things appear at this point. There is no signs of active infection which is also good news. No fevers, chills, nausea, vomiting, or diarrhea. Integumentary (Hair, Skin) Wound #145R status is Open. Original cause of wound was Gradually Appeared. The wound is located on the Right,Medial Upper Leg. The wound measures 1cm length x 1cm width x 0.1cm depth; 0.785cm^2 area and 0.079cm^3 volume. There is no tunneling or undermining noted. There is a medium amount of serous drainage noted. The wound margin is flat and intact. There is large (67-100%) red granulation within the wound bed. There is no necrotic tissue within the wound bed. Wound #156 status is Open. Original cause of wound was Blister. The wound is located on the Left,Dorsal Foot. The wound measures 1cm length x 1.3cm width x 0.1cm depth; 1.021cm^2 area and 0.102cm^3 volume. There is no tunneling or undermining noted. There is a medium amount of serous drainage noted. The wound margin is distinct with the outline attached to the wound base. There is large (67-100%) pink granulation within the wound bed. There is no necrotic tissue within the wound bed. Wound #157 status is Open. Original cause of wound was Blister. The wound is located on the Left Toe Great. The wound measures 0.6cm length x 0.4cm width x 0.1cm depth; 0.188cm^2 area and 0.019cm^3 volume. There is Fat Layer (Subcutaneous  Tissue) Exposed exposed. There is no tunneling or undermining noted. There is a small amount of serous drainage noted. The wound margin is flat and intact. There is large (67-100%) pink granulation within the wound bed. There is no necrotic tissue within the wound bed. 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 There was a Four Layer Compression Therapy Procedure by Yevonne Pax, RN. Post procedure Diagnosis Wound #156: Same as Pre-Procedure There was a Radio broadcast assistant Compression Therapy Procedure by Yevonne Pax, RN. Post procedure Diagnosis Wound #: Same as Pre-Procedure Plan Follow-up Appointments: Return Appointment in 1 week. Dressing Change Frequency: Do not change entire dressing for  one week. - both lower legs Skin Barriers/Peri-Wound Care: Moisturizing lotion TCA Cream or Ointment - to legs mixed with lotion Wound #145R Right,Medial Upper Leg: TCA Cream or Ointment - to rash daily Wound Cleansing: May shower with protection. Primary Wound Dressing: Wound #156 Left,Dorsal Foot: Calcium Alginate with Silver Wound #157 Left Toe Great: Calcium Alginate with Silver Secondary Dressing: Wound #145R Right,Medial Upper Leg: Kerlix/Rolled Gauze ABD pad - NO TAPE ON SKIN Wound #156 Left,Dorsal Foot: Dry Gauze Wound #157 Left Toe Great: Dry Gauze Edema Control: 4 layer compression: Left lower extremity - unna boot at upper portion of lower  leg. pad bend of foot with foam Unna Boot to Right Lower Extremity Avoid standing for long periods of time Elevate legs to the level of the heart or above for 30 minutes daily and/or when sitting, a frequency of: - throughout the day Exercise regularly Segmental Compressive Device. - [facility] pumps 60 min 2 times per day Other: - [facility] wraps to upper leg per [facility] The following medication(s) was prescribed: doxycycline hyclate oral 100 mg capsule 1 1 capsule oral taken 2 times a day for 14 days starting 12/22/2019 triamcinolone acetonide topical 0.1 % ointment ointment topical applied with each dressing change as directed to your legs starting 12/22/2019 1. My suggestion at this time is going to be that we use TCA cream or ointment to the right medial upper leg which we can then cover with ABD pads and secure with roll gauze at this point. 2. I am also going to suggest that we continue with the wraps on the lower extremities bilaterally at this time as before this will be an Unna boot wrap on the right and a 4-layer compression wrap on the left. 3. He will continue with his wraps that he has been utilizing as far as the lymphedema wraps for the upper legs at this point that seems to be helping. He still has not taken the Lasix as recommended by Dr. [name] he tells me he just cannot do that. We will see patient back for reevaluation in 1 week here in the clinic. If anything worsens or changes patient will contact our office for additional recommendations. Electronic Signature(s) Signed: 12/22/2019 11:35:41 AM By: Lenda Kelp PA-C Entered By: Lenda Kelp on 12/22/2019 11:35:40 -------------------------------------------------------------------------------- SuperBill Details Patient Name: Date of Service: [name], [name] 12/22/2019 Medical Record [id number] Patient Account Number: [id number] Date of Birth/Sex: Treating RN: Dec 03, 1950 (69 y.o. 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, unspecified L03.116 Cellulitis of left lower limb Facility Procedures CPT4 Code Description: [id number] (Facility Use Only) [phone] - APPLY UNNA BOOT RT Modifier: Quantity: 1 CPT4 Code Description: 57846962 (Facility Use Only) 29581LT - APPLY MULTLAY COMPRS LWR LT LEG Modifier: 59 Quantity: 1 Physician Procedures CPT4: Code [id number] Description: 214 - WC PHYS LEVEL 4 - EST PT ICD-10 Diagnosis Description L97.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 right calf limited to brea L97.221 Non-pressure chronic ulcer of left calf limited to break I87.333 Chronic venous hypertension (idiopathic)  with  ulcer and lower extremity I89.0 Lymphedema, not elsewhere classified Modifier: kdown of skin down of skin inflammation of Quantity: 1 bilateral Electronic Signature(s) Signed: 12/22/2019 11:36:03 AM By: Lenda Kelp PA-C Entered By: Lenda Kelp on 12/22/2019 11:35:58

## 2019-12-29 ENCOUNTER — Other Ambulatory Visit: Payer: Self-pay

## 2019-12-29 ENCOUNTER — Encounter (HOSPITAL_BASED_OUTPATIENT_CLINIC_OR_DEPARTMENT_OTHER): Payer: Medicare Other | Admitting: Physician Assistant

## 2019-12-29 DIAGNOSIS — E1140 Type 2 diabetes mellitus with diabetic neuropathy, unspecified: Secondary | ICD-10-CM | POA: Diagnosis not present

## 2019-12-29 DIAGNOSIS — L03116 Cellulitis of left lower limb: Secondary | ICD-10-CM | POA: Diagnosis not present

## 2019-12-29 DIAGNOSIS — L97529 Non-pressure chronic ulcer of other part of left foot with unspecified severity: Secondary | ICD-10-CM | POA: Diagnosis not present

## 2019-12-29 DIAGNOSIS — I87333 Chronic venous hypertension (idiopathic) with ulcer and inflammation of bilateral lower extremity: Secondary | ICD-10-CM | POA: Diagnosis not present

## 2019-12-29 DIAGNOSIS — I482 Chronic atrial fibrillation, unspecified: Secondary | ICD-10-CM | POA: Diagnosis not present

## 2019-12-29 DIAGNOSIS — I87313 Chronic venous hypertension (idiopathic) with ulcer of bilateral lower extremity: Secondary | ICD-10-CM | POA: Diagnosis not present

## 2019-12-29 DIAGNOSIS — L97119 Non-pressure chronic ulcer of right thigh with unspecified severity: Secondary | ICD-10-CM | POA: Diagnosis not present

## 2019-12-29 DIAGNOSIS — I890 Lymphedema, not elsewhere classified: Secondary | ICD-10-CM | POA: Diagnosis not present

## 2019-12-29 DIAGNOSIS — E11622 Type 2 diabetes mellitus with other skin ulcer: Secondary | ICD-10-CM | POA: Diagnosis not present

## 2019-12-29 DIAGNOSIS — E11621 Type 2 diabetes mellitus with foot ulcer: Secondary | ICD-10-CM | POA: Diagnosis not present

## 2019-12-29 DIAGNOSIS — L97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 right calf limited to breakdown of skin: Secondary | ICD-10-CM | POA: Diagnosis not present

## 2019-12-29 DIAGNOSIS — M109 Gout, unspecified: Secondary | ICD-10-CM | POA: Diagnosis not present

## 2019-12-29 DIAGNOSIS — L97221 Non-pressure chronic ulcer of left calf limited to breakdown of skin: Secondary | ICD-10-CM | POA: Diagnosis not present

## 2019-12-29 DIAGNOSIS — I10 Essential (primary) hypertension: Secondary | ICD-10-CM | POA: Diagnosis not present

## 2019-12-29 NOTE — Progress Notes (Addendum)
SACRAMENTO, MONDS ([id number]) Visit Report for 12/29/2019 Chief Complaint Document Details Patient Name: Date of Service: [name], [name] 12/29/2019 10:15 AM Medical Record [id number] Patient Account Number: [id number] Date of Birth/Sex: Treating RN: [birth year] (69 y.o. [name] Primary Care Provider: Nicoletta Ba Other Clinician: Referring Provider: Treating Provider/Extender:[name], [name], [name] in Treatment: 205 Information Obtained from: Patient Chief Complaint patient is here for evaluation venous/lymphedema weeping Electronic Signature(s) Signed: 12/29/2019 10:29:32 AM By: Lenda Kelp PA-C Entered By: Lenda Kelp on 12/29/2019 10:29:32 -------------------------------------------------------------------------------- HPI Details Patient Name: Date of Service: [name], [name] 12/29/2019 10:15 AM Medical Record [id number] Patient Account Number: [id number] Date of Birth/Sex: Treating RN: 1950-12-21 (69 y.o. [name] Primary Care Provider: Nicoletta Ba Other Clinician: Referring Provider: Treating Provider/Extender:[name], [name], [name] in Treatment: 205 History of Present Illness HPI Description: Referred by PCP for consultation. Patient has long standing history of BLE venous stasis, no prior ulcerations. At beginning of month, developed cellulitis and weeping. Received IM Rocephin followed by Keflex and resolved. Wears compression stocking, appr 6 months old. Not sure strength. No present drainage. 01/22/16 this is a patient who is a type II diabetic on insulin. 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 He tells me he religiously wears pressure stockings of uncertain strength. He was here with weeping edema about 8 months ago but did not have an open wound. Roughly a month ago he had a reopening on his bilateral legs. He is been using bandages and Neosporin. He does not  complain of pain. 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 Last BUN/creatinine I have is from 11/20/15 at 13 and 1.0 respectively 01/29/16; patient arrives today having tolerated the Profore wrap. He brought in his stockings and these are 18 mmHg stockings he bought from [place]. The compression here is likely inadequate. He does not complain of pain or excessive drainage she has no systemic symptoms. The wound on the right looks improved as does the one on the left although one on the left is more substantial with still tissue at risk below the actual wound area on the bilateral posterior calf 02/05/16; patient arrives with poor edema control. He states that we did put a 4 layer compression on it last week. No weight appear 5 this. 02/12/16; the area on the posterior right Has healed. The left Has a substantial wound that has necrotic surface eschar that requires a debridement with a curette. 02/16/16;the patient called or a Nurse visit secondary to increased swelling. He had been in earlier in the week with his right leg healed. He was transitioned to is on pressure stocking on the right leg with the only open wound on the left, a substantial area on the left posterior calf. Note he has a history of severe lower extremity edema, he has a history of chronic atrial fibrillation but not heart failure per my notes but I'll need to research this. He is not complaining of chest pain shortness of breath or orthopnea. The intake nurse noted blisters on the previously closed right leg 02/19/16; this is the patient's regular visit day. I see him on Friday with escalating edema new wounds on the right leg and clear signs of at least right ventricular heart failure. I increased his Lasix to 40 twice a day. He is returning currently in follow-up. States he  is noticed a decrease in that the edema 02/26/16  patient's legs have much less edema. There is nothing really open on the right leg. The left leg has improved condition of the large superficial wound on the posterior left leg 03/04/16; edema control is very much better. The patient's right leg wounds have healed. On the left leg he continues to have severe venous inflammation on the posterior aspect of the left leg. There is no tenderness and I don't think any of this is cellulitis. 03/11/16; patient's right leg is married healed and he is in his own stocking. The patient's left leg has deteriorated somewhat. There is a lot of erythema around the wound on the posterior left leg. There is also a significant rim of erythema posteriorly just above where the wrap would've ended there is a new wound in this location and a lot of tenderness. Can't rule out cellulitis in this area. 03/15/16; patient's right leg remains healed and he is in his own stocking. The patient's left leg is much better than last review. His major wound on the posterior aspect of his left Is almost fully epithelialized. He has 3 small injuries from the wraps. Really. Erythema seems a lot better on antibiotics 03/18/16; right leg remains healed and he is in his own stocking. The patient's left leg is much better. The area on the posterior aspect of the left calf is fully epithelialized. His 3 small injuries which were wrap injuries on the left are improved only one seems still open his erythema has resolved 03/25/16; patient's right leg remains healed and he is in his own stocking. There is no open area today on the left leg posterior leg is completely closed up. His wrap injuries at the superior aspect of his leg are also resolved. He looks as though he has some irritation on the dorsal ankle but this is fully epithelialized without evidence of infection. 03/28/16; we discharged this patient on Monday. Transitioned him into his own stocking. There were problems almost immediately with  uncontrolled swelling weeping edema multiple some of which have opened. He does not feel systemically unwell in particular no chest pain no shortness of breath and he does not feel 04/08/16; the edema is under better control with the Profore light wrap but he still has pitting edema. There is one large wound anteriorly 2 on the medial aspect of his left leg and 3 small areas on the superior posterior calf. Drainage is not excessive he is tolerating a Profore light well 04/15/16; put a Profore wrap on him last week. This is controlled is edema however he had a lot of pain on his left anterior foot most of his wounds are healed 04/22/16 once again the patient has denuded areas on the left anterior foot which he states are because his wrap slips up word. He saw his primary physician today is on Lasix 40 twice a day and states that he his weight is down 20 pounds over the last 3 months. 04/29/16: Much improved. left anterior foot much improved. He is now on Lasix 80 mg per day. Much improved edema control 05/06/16; I was hoping to be able to discharge him today however once again he has blisters at a low level of where the compression was placed last week mostly on his left lateral but also his left medial leg and a small area on the anterior part of the left foot. 05/09/16; apparently the patient went home after his appointment on 7/4 later in  the evening developing pain in his upper medial thigh together with subjective fever and chills although his temperature was not taken. The pain was so intense he felt he would probably have to call 911. However he then remembered that he had leftover doxycycline from a previous round of antibiotics and took these. By the next morning he felt a lot better. He called and spoke to one of our nurses and I approved doxycycline over the phone thinking that this was in relation to the wounds we had previously seen although they were definitely were not. The patient feels a  lot better old fever no chills he is still working. Blood sugars are reasonably controlled 05/13/16; patient is back in for review of his cellulitis on his anterior medial upper thigh. He is taking doxycycline this is a lot better. Culture I did of the nodular area on the dorsal aspect of his foot grew MRSA this also looks a lot better. 05/20/16; the patient is cellulitis on the medial upper thigh has resolved. All of his wound areas including the left anterior foot, areas on the medial aspect of the left calf and the lateral aspect of the calf at all resolved. He has a new blister on the left dorsal foot at the level of the fourth toe this was excised. No evidence of infection 05/27/16; patient continues to complain weeping edema. He has new blisterlike wounds on the left anterior lateral and posterior lateral calf at the top of his wrap levels. The area on his left anterior foot appears better. He is not complaining of fever, pain or pruritus in his feet. 05/30/16; the patient's blisters on his left anterior leg posterior calf all look improved. He did not increase the Lasix 100 mg as I suggested because he was going to run out of his 40 mg tablets. He is still having weeping edema of his toes 06/03/16; I renewed his Lasix at 80 mg once a day as he was about to run out when I last saw him. He is on 80 mg of Lasix now. I have asked him to cut down on the excessive amount of water he was drinking and asked him to drink according to his thirst mechanisms 06/12/2016 -- was seen 2 days ago and was supposed to wear his compression stockings at home but he is developed lymphedema and superficial blisters on the left lower extremity and hence came in for a review 06/24/16; the remaining wound is on his left anterior leg. He still has edema coming from between his toes. There is lymphedema here however his edema is generally better than when I last saw this. He has a history of atrial fibrillation but does  not have a known history of congestive heart failure nevertheless I think he probably has this at least on a diastolic basis. 07/01/16 I reviewed his echocardiogram from January 2017. This was essentially normal. He did not have LVH, EF of 55-60%. His right ventricular function was normal although he did have trivial tricuspid and pulmonic regurgitation. This is not audible on exam however. I increased his Lasix to do massive edema in his legs well above his knees I think in early July. He was also drinking an excessive amount of water at the time. 07/15/16; missed his appointment last week because of the Labor Day holiday on Monday. He could not get another appointment later in the week. Started to feel the wrap digging in superiorly so we remove the top half and the bottom  half of his wrap. He has extensive erythema and blistering superiorly in the left leg. Very tender. Very swollen. Edema in his foot with leaking edema fluid. He has not been systemically unwell 07/22/16; the area on the left leg laterally required some debridement. The medial wounds look more stable. His wrap injury wounds appear to have healed. Edema and his foot is better, weeping edema is also better. He tells me he is meeting with the supplier of the external compression pumps at work 08/05/16; the patient was on vacation last week in [facility]. His wrap is been on for an extended period of time. Also over the weekend he developed an extensive area of tender erythema across his anterior medial thigh. He took to doxycycline yesterday that he had leftover from a previous prescription. The patient complains of weeping edema coming out of his toes 08/08/16; I saw this patient on 10/2. He was tender across his anterior thigh. I put him on doxycycline. He returns today in follow-up. He does not have any open wounds on his lower leg, he still has edema weeping into his toes. 08/12/16; patient was seen back urgently today to  follow-up for his extensive left thigh cellulitis/erysipelas. He comes back with a lot less swelling and erythema pain is much better. I believe I gave him Augmentin and Cipro. His wrap was cut down as he stated a roll down his legs. He developed blistering above the level of the wrap that remained. He has 2 open blisters and 1 intact. 08/19/16; patient is been doing his primary doctor who is increased his Lasix from 40-80 once a day or 80 already has less edema. Cellulitis has remained improved in the left thigh. 2 open areas on the posterior left calf 08/26/16; he returns today having new open blisters on the anterior part of his left leg. He has his compression pumps but is not yet been shown how to use some vital representative from the supplier. 09/02/16 patient returns today with no open wounds on the left leg. Some maceration in his plantar toes 09/10/2016 -- Dr. Leanord Hawking had recently discharged him on 09/02/2016 and he has come right back with redness swelling and some open ulcers on his left lower extremity. He says this was caused by trying to apply his compression stockings and he's been unable to use this and has not been able to use his lymphedema pumps. He had some doxycycline leftover and he has started on this a few days ago. 09/16/16; there are no open wounds on his leg on the left and no evidence of cellulitis. 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 He does not complain of symptoms here. I am not clear use using his external compression pumps. 09/23/16; I have not seen this patient in 2 weeks. He canceled his appointment 10 days ago as he was going on vacation. He tells me that on Monday he noticed a large area on his posterior left leg which is been draining copiously and is reopened into a large wound. He is been using ABDs and the external part of his juxtalite, according to our nurse this was not on  properly. 10/07/16; Still a substantial area on the posterior left leg. Using silver alginate 10/14/16; in general better although there is still open area which looks healthy. Still using silver alginate. He reminds me that this happen before he left for [facility]. Today while he was showering in the morning.  He had been using his juxtalite's 10/21/16; the area on his posterior left leg is fully epithelialized. However he arrives today with a large area of tender erythema in his medial and posterior left thigh just above the knee. I have marked the area. Once again he is reluctant to consider hospitalization. I treated him with oral antibiotics in the past for a similar situation with resolution I think with doxycycline however this area it seems more extensive to me. He is not complaining of fever but does have chills and says states he is thirsty. His blood sugar today was in the 140s at home 10/25/16 the area on his posterior left leg is fully epithelialized although there is still some weeping edema. The large area of tenderness and erythema in his medial and posterior left thigh is a lot less tender although there is still a lot of swelling in this thigh. He states he feels a lot better. He is on doxycycline and Augmentin that I started last week. This will continued until Tuesday, December 26. I have ordered a duplex ultrasound of the left thigh rule out DVT whether there is an abscess something that would need to be drained I would also like to know. 11/01/16; he still has weeping edema from a not fully epithelialized area on his left posterior calf. Most of the rest of this looks a lot better. He has completed his antibiotics. His thigh is a lot better. Duplex ultrasound did not show a DVT in the thigh 11/08/16; he comes in today with more Denuded surface epithelium from the posterior aspect of his calf. There is no real evidence of cellulitis. The superior aspect of his wrap appears to  have put quite an indentation in his leg just below the knee and this may have contributed. He does not complain of pain or fever. We have been using silver alginate as the primary dressing. The area of cellulitis in the right thigh has totally resolved. He has been using his compression stockings once a week 11/15/16; the patient arrives today with more loss of epithelium from the posterior aspect of his left calf. He now has a fairly substantial wound in this area. The reason behind this deterioration isn't exactly clear although his edema is not well controlled. He states he feels he is generally more swollen systemically. He is not complaining of chest pain shortness of breath fever. Tells me he has an appointment with his primary physician in early February. He is on 80 mg of oral Lasix a day. He claims compliance with the external compression pumps. He is not having any pain in his legs similar to what he has with his recurrent cellulitis 11/22/16; the patient arrives a follow-up of his large area on his left lateral calf. This looks somewhat better today. He came in earlier in the week for a dressing change since I saw him a week ago. He is not complaining of any pain no shortness of breath no chest pain 11/28/16; the patient arrives for follow-up of his large area on the left lateral calf this does not look better. In fact it is larger weeping edema. The surface of the wound does not look too bad. We have been using silver alginate although I'm not certain that this is a dressing issue. 12/05/16; again the patient follows up for a large wound on the left lateral and left posterior calf this does not look better. There continues to be weeping edema necrotic surface tissue. More worrisome than  this once again there is erythema below the wound involving the distal Achilles and heel suggestive of cellulitis. He is on his feet working most of the day of this is not going well. We are changing his  dressing twice a week to facilitate the drainage. 12/12/16; not much change in the overall dimensions of the large area on the left posterior calf. This is very inflamed looking. I gave him an. Doxycycline last week does not really seem to have helped. He found the wrap very painful indeed it seems to of dog into his legs superiorly and perhaps around the heel. He came in early today because the drainage had soaked through his dressings. 12/19/16- patient arrives for follow-up evaluation of his left lower extremity ulcers. He states that he is using his lymphedema pumps once daily when there is "no drainage". He admits to not using his lipedema pumps while under current treatment. His blood sugars have been consistently between 150-200. 12/26/16; the patient is not using his compression pumps at home because of the wetness on his feet. I've advised him that I think it's important for him to use this daily. He finds his feet too wet, he can put a plastic bag over his legs while he is in the pumps. Otherwise I think will be in a vicious circle. We are using silver alginate to the major area on his left posterior calf 01/02/17; the patient's posterior left leg has further of all into 3 open wounds. All of them covered with a necrotic surface. He claims to be using his compression pumps once a day. His edema control is marginal. Continue with silver alginate 01/10/17; the patient's left posterior leg actually looks somewhat better. There is less edema, less erythema. Still has 3 open areas covered with a necrotic surface requiring debridement. He claims to be using his compression pumps once a day his edema control is better 01/17/17; the patient's left posterior calf look better last week when I saw him and his wrap was changed 2 days ago. He has noted increasing pain in the left heel and arrives today with much larger wounds extensive erythema extending down into the entire heel area especially tender  medially. He is not systemically unwell CBGs have been controlled no fever. Our intake nurse showed me limegreen drainage on his AVD pads. 01/24/17; his usual this patient responds nicely to antibiotics last week giving him Levaquin for presumed Pseudomonas. The whole entire posterior part of his leg is much better much less inflamed and in the case of his Achilles heel area much less tender. He has also had some epithelialization posteriorly there are still open areas here and still draining but overall considerably better 01/31/17- He has continue to tolerate the compression wraps. he states that he continues to use the lymphedema pumps daily, and can increase to twice daily on the weekends. He is voicing no complaints or concerns regarding his LLE ulcers 02/07/17-he is here for follow-up evaluation. He states that he noted some erythema to the left medial and anterior thigh, which he states is new as of yesterday. He is concerned about recurrent cellulitis. He states his blood sugars have been slightly elevated, this morning in the 180s 02/14/17; he is here for follow-up evaluation. When he was last here there was erythema superiorly from his posterior wound in his anterior thigh. He was prescribed Levaquin however a culture of the wound surface grew MRSA over the phone I changed him to doxycycline on Monday and things  seem to be a lot better. 02/24/17; patient missed his appointment on Friday therefore we changed his nurse visit into a physician visit today. Still using silver alginate on the large area of the posterior left thigh. He isn't new area on the dorsal left second toe 03/03/17; actually better today although he admits he has not used his external compression pumps in the last 2 days or so because of work responsibilities over the weekend. 03/10/17; continued improvement. External compression pumps once a day almost all of his wounds have closed on the posterior left calf. Better edema  control 03/17/17; in general improved. He still has 3 small open areas on the lateral aspect of his left leg however most of the area on the posterior part of his leg is epithelialized. He has better edema control. He has an ABD pad under his stocking on the right anterior lower leg although he did not let us look at that today. 03/24/17; patient arrives back in clinic today with no open areas however there are areas on the posterior left calf and anterior left calf that are less than 100% epithelialized. His edema is well controlled in the left lower leg. There is some pitting edema probably lymphedema in the left upper thigh. He uses compression pumps at home once per day. I tried to get him to do this twice a day although he is very reticent. 04/01/2017 -- for the last 2 days he's had significant redness, tenderness and weeping and came in for an urgent visit today. 04/07/17; patient still has 6 more days of doxycycline. He was seen by Dr. Con Memos last Wednesday for cellulitis involving the posterior aspect, lateral aspect of his Involving his heel. For the most part he is better there is less erythema and less weeping. He has been on his feet for 12 hours 2 over the weekend. Using his compression pumps once a day 04/14/17 arrives today with continued improvement. Only one area on the posterior left calf that is not fully epithelialized. He has intense bilateral venous inflammation associated with his chronic venous insufficiency disease and secondary lymphedema. We have been using silver alginate to the left posterior calf wound In passing he tells [place] today that the right leg but we have not seen in quite some time has an open area on it but he doesn't want [place] to look at this today states he will show this to [place] next week. 04/21/17; there is no open area on his left leg although he still reports some weeping edema. He showed [place] his right leg today which is the first time we've seen this leg in a  long time. He has a large area of open wound on the right leg anteriorly healthy granulation. Quite a bit of swelling in the right leg and some degree of venous inflammation. He told us about the right leg in passing last week but states that deterioration in the right leg really only happened over the weekend 04/28/17; there is no open area on the left leg although there is an irritated part on the posterior which is like a wrap injury. The wound on the right leg which was new from last week at least to [place] is a lot better. 05/05/17; still no open area on the left leg. Patient is using his new compression stocking which seems to be doing a good job of controlling the edema. He states he is using his compression pumps once per day. The right leg still has an  open wound although it is better in terms of surface area. Required debridement. A lot of pain in the posterior right Achilles marked tenderness. Usually this type of presentation this patient gives concern for an active cellulitis 05/12/17; patient arrives today with his major wound from last week on the right lateral leg somewhat better. Still requiring debridement. He was using his compression stocking on the left leg however that is reopened with superficial wounds anteriorly he did not have an open wound on this leg previously. He is still using his juxta light's once daily at night. He cannot find the time to do this in the morning as he has to be at work by 7 AM 05/19/17; right lateral leg wound looks improved. No debridement required. The concerning area is on the left posterior leg which appears to almost have a subcutaneous hemorrhagic component to it. We've been using silver alginate to all the wounds 05/26/17; the right lateral leg wound continues to look improved. However the area on the left posterior calf is a tightly adherent surface. [place] using silver alginate. Because of the weeping edema in his legs there is very little  good alternatives. 06/02/17; the patient left here last week looking quite good. Major wound on the left posterior calf and a small one on the right lateral calf. Both of these look satisfactory. He tells me that by Wednesday he had noted increased pain in the left leg and drainage. He called on Thursday and Friday to get an appointment here but we were blocked. He did not go to urgent care or his primary physician. He thinks he had a fever on Thursday but did not actually take his temperature. He has not been using his compression pumps on the left leg because of pain. I advised him to go to the emergency room today for IV antibiotics for stents of left leg cellulitis but he has refused I have asked him to take 2 days off work to keep his leg elevated and he has refused this as well. In view of this I'm going to call him and Augmentin and doxycycline. 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 However after a detailed discussion regarding the risk benefits and alternatives he refuses to get admitted to the hospital. With no other recourse I will continue him on oral antibiotics as before and hopefully he'll have his infectious disease consultation this week. 06/16/2017 -- the patient was seen today by the nurse practitioner at infectious disease Ms. Dixon. 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 He has also been advise Lotrimin cream locally. she also had wise IV antibiotics for his condition if it flares up 06/23/17; patient arrives today with drainage bilaterally although the  remaining wound on the left posterior calf after cleaning up today "highlighter yellow drainage" did not look too bad. Unfortunately he has had breakdown on the right anterior leg [previously this leg had not been open and he is using a black stocking] he went to see infectious disease and is been put on clindamycin 150 mg daily, I  did not verify the dose although I'm not familiar with using clindamycin in this dosing range, perhaps for prophylaxisoo 06/27/17; I brought this patient back today to follow-up on the wound deterioration on the right lower leg together with surrounding cellulitis. I started him on doxycycline 4 days ago. This area looks better however he comes in today with intense cellulitis on the medial part of his left thigh. This is not have a wound in this area. Extremely tender. We've been using silver alginate to the wounds on the right lower leg left lower leg with bilateral 4 layer compression he is using his external compression pumps once a day 07/04/17; patient's left medial thigh cellulitis looks better. He has not been using his compression pumps as his insert said it was contraindicated with cellulitis. His right leg continues to make improvements all the wounds are still open. We only have one remaining wound on the left posterior calf. Using silver alginate to all open areas. He is on doxycycline which I started a week ago and should be finishing I gave him Augmentin after Thursday's visit for the severe cellulitis on the left medial thigh which fortunately looks better 07/14/17; the patient's left medial thigh cellulitis has resolved. The cellulitis in his right lower calf on the right also looks better. All of his wounds are stable to improved we've been using silver alginate he has completed the antibiotics I have given him. He has clindamycin 150 mg once a day prescribed by infectious disease for prophylaxis, I've advised him to start this now. We have been using  bilateral Unna boots over silver alginate to the wound areas 07/21/17; the patient is been to see infectious disease who noted his recurrent problems with cellulitis. He was not able to tolerate prophylactic clindamycin therefore he is on amoxicillin 500 twice a day. He also had a second daily dose of Lasix added By Dr. Oneta RackMcKeown but he is not taking this. Nor is he being completely compliant with his compression pumps a especially not this week. He has 2 remaining wounds one on the right posterior lateral lower leg and one on the left posterior medial lower leg. 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 The patient has Unna boots bilaterally. 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 He has bilateral Unna boots for compression and silver alginate to his wounds. Arrives today with his legs looking as good as I have seen him in quite some time. Not surprisingly his wounds look better as well with improvement on the right lateral leg venous insufficiency wound and also the left medial leg. He is still using the compression pumps once a day 08/11/17; both legs appear to be doing better wounds on the right lateral and left medial legs look better. Skin on the right leg quite good. He is been using silver alginate as the primary dressing. I'm going to use Anasept gel calcium alginate and maintain all the secondary dressings 08/18/17; the patient continues to actually do quite well. The area on his right lateral leg is just about closed the left medial also looks better although it is still moist in this area. His edema is well controlled we have been using Anasept gel with calcium alginate and the usual secondary dressings, 4 layer compression and once daily  use of his compression  pumps "always been able to manage 09/01/17; the patient continues to do reasonably well in spite of his trip to [place]. The area on the right lateral leg is epithelialized. Left is much better but still open. He has more edema and more chronic erythema on the left leg [venous inflammation] 09/08/17; he arrives today with no open wound on the right lateral leg and decently controlled edema. Unfortunately his left leg is not nearly as in his good situation as last week.he apparently had increasing edema starting on Saturday. He edema soaked through into his foot so used a plastic bag to walk around his home. The area on the medial right leg which was his open area is about the same however he has lost surface epithelium on the left lateral which is new and he has significant pain in the Achilles area of the left foot. He is already on amoxicillin chronically for prophylaxis of cellulitis in the left leg 09/15/17; he is completed a week of doxycycline and the cellulitis in the left posterior leg and Achilles area is as usual improved. He still has a lot of edema and fluid soaking through his dressings. There is no open wound on the right leg. He saw infectious disease NP today 09/22/17;As usual 1 we transition him from our compression wraps to his stockings things did not go well. He has several small open areas on the right leg. He states this was caused by the compression wrap on his skin although he did not wear this with the stockings over them. He has several superficial areas on the left leg medially laterally posteriorly. He does not have any evidence of active cellulitis especially involving the left Achilles The patient is traveling from [facility] Saturday going to [facility]. He states he isn't attempting to get an appointment with a heel objects wound center there to change his dressings. I am not completely certain whether this will work 10/06/17; the patient came in on Friday for a  nurse visit and the nurse reported that his legs actually look quite good. He arrives in clinic today for his regular follow-up visit. He has a new wound on his left third toe over the PIP probably caused by friction with his footwear. He has small areas on the left leg and a very superficial but epithelialized area on the right anterior lateral lower leg. Other than that his legs look as good as I've seen him in quite some time. We have been using silver alginate Review of systems; no chest pain no shortness of breath other than this a 10 point review of systems negative 10/20/17; seen by Dr. Meyer Russel last week. He had taken some antibiotics [doxycycline] that he had left over. Dr. Meyer Russel thought he had candida infection and declined to give him further antibiotics. He has a small wound remaining on the right lateral leg several areas on the left leg including a larger area on the left posterior several left medial and anterior and a small wound on the left lateral. The area on the left dorsal third toe looks a lot better. ROS; Gen.; no fever, respiratory no cough no sputum Cardiac no chest pain other than this 10 point review of system is negative 10/30/17; patient arrives today having fallen in the bathtub 3 days ago. It took him a while to get up. He has pain and maceration in the wounds on his left leg which have deteriorated. He has not been using his pumps  he also has some maceration on the right lateral leg. 11/03/17; patient continues to have weeping edema especially in the left leg. This saturates his dressings which were just put on on 12/27. As usual the doxycycline seems to take care of the cellulitis on his lower leg. He is not complaining of fever, chills, or other systemic symptoms. He states his leg feels a lot better on the doxycycline I gave him empirically. He also apparently gets injections at his primary doctor's officeo Rocephin for cellulitis prophylaxis. I didn't ask him  about his compression pump compliance today I think that's probably marginal. Arrives in the clinic with all of his dressings primary and secondary macerated full of fluid and he has bilateral edema 11/10/17; the patient's right leg looks some better although there is still a cluster of wounds on the right lateral. The left leg is inflamed with almost circumferential skin loss medially to laterally although we are still maintaining anteriorly. He does not have overt cellulitis there is a lot of drainage. He is not using compression pumps. We have been using silver alginate to the wound areas, there are not a lot of options here 11/17/17; the patient's right leg continues to be stable although there is still open wounds, better than last week. The inflammation in the left leg is better. Still loss of surface layer epithelium especially posteriorly. There is no overt cellulitis in the amount of edema and his left leg is really quite good, tells me he is using his compression pumps once a day. 11/24/17; patient's right leg has a small superficial wound laterally this continues to improve. The inflammation in the left leg is still improving however we have continuous surface layer epithelial loss posteriorly. There is no overt cellulitis in the amount of edema in both legs is really quite good. He states he is using his compression pumps on the left leg once a day for 5 out of 7 days 12/01/17; very small superficial areas on the right lateral leg continue to improve. Edema control in both legs is better today. He has continued loss of surface epithelialization and left posterior calf although I think this is better. We have been using silver alginate with large number of absorptive secondary dressings 4 layer on the left Unna boot on the right at his request. He tells me he is using his compression pumps once a day 12/08/17; he has no open area on the right leg is edema control is good here. On the left leg  however he has marked erythema and tenderness breakdown of skin. He has what appears to be a wrap injury just distal to the popliteal fossa. 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 There was swelling erythema and breakdown in the right leg. He has much worse skin breakdown in the left leg as well multiple open areas medially and posteriorly as well as laterally. He tells me he has been using his compression pumps but tells me he feels that the drainage out of his leg is worse when he uses a compression pumps. To be fair to him he is been saying this for a while however I don't know that I have really been listening to this. I wonder if  the compression pumps are working properly 12/22/17;. Once again he arrives with severe erythema, weeping edema from the left greater than right leg. Noncompliance with compression pumps. New this visit he is complaining of pain on the lateral aspect of the right leg and the medial aspect of his right thigh. He apparently saw his cardiologist Dr. Debara Pickett who was ordered an echocardiogram area and I think this is a step in the right direction 12/25/17; started his doxycycline Monday night. There is still intense erythema of the right leg especially in the anterior thigh although there is less tenderness. The erythema around the wound on the right lateral calf also is less tender. He still complaining of pain in the left heel. His wounds are about the same right lateral left medial left lateral. Superficial but certainly not close to closure. He denies being systemically unwell no fever chills no abdominal pain no diarrhea 12/29/17; back in follow-up of his extensive right calf and right thigh  cellulitis. I added amoxicillin to cover possible doxycycline resistant strep. This seems to of done the trick he is in much less pain there is much less erythema and swelling. He has his echocardiogram at 11:00 this morning. X-ray of the left heel was also negative. 01/05/18; the patient arrived with his edema under much better control. Now that he is retired he is able to use his compression pumps daily and sometimes twice a day per the patient. He has a wound on the right leg the lateral wound looks better. Area on the left leg also looks a lot better. He has no evidence of cellulitis in his bilateral thighs I had a quick peak at his echocardiogram. He is in normal ejection fraction and normal left ventricular function. He has moderate pulmonary hypertension moderately reduced right ventricular function. One would have to wonder about chronic sleep apnea although he says he doesn't snore. He'll review the echocardiogram with his cardiologist. 01/12/18; the patient arrives with the edema in both legs under exemplary control. He is using his compression pumps daily and sometimes twice daily. His wound on the right lateral leg is just about closed. He still has some weeping areas on the posterior left calf and lateral left calf although everything is just about closed here as well. I have spoken with Jeri Lager who is the patient's nurse practitioner and infectious disease. She was concerned that the patient had not understood that the parenteral penicillin injections he was receiving for cellulitis prophylaxis was actually benefiting him. I don't think the patient actually saw that I would tend to agree we were certainly dealing with less infections although he had a serious one last month. 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 He is healed. He'll be placed in juxtalite compression wraps and increase his lymphedema pumps to twice daily. We will follow up  again next week to ensure there are no issues with the new regiment. 01/20/18-he is here for evaluation of bilateral lower extremity weeping edema. Yesterday he was placed in compression wrap to the right lower extremity and compression stocking to left lower shrubbery. He states he uses lymphedema pumps last night and again this morning and noted a blister to the left lower extremity. On exam he was noted to have drainage to the right lower extremity. He will be placed in Unna boots bilaterally and follow-up next week 01/26/18; patient was actually discharged a week ago to his own juxta light stockings only to return the next  day with bilateral lower extremity weeping edema.he was placed in bilateral Unna boots. He arrives today with pain in the back of his left leg. There is no open area on the right leg however there is a linear/wrap injury on the left leg and weeping edema on the left leg posteriorly. I spoke with infectious disease about 10 days ago. They were disappointed that the patient elected to discontinue prophylactic intramuscular penicillin shots as they felt it was particularly beneficial in reducing the frequency of his cellulitis. I discussed this with the patient today. He does not share this view. He'll definitely need antibiotics today. Finally he is traveling to [place] and trauma leaving this Saturday and returning a week later and he does not travel with his pumps. He is going by car 01/30/18; patient was seen 4 days ago and brought back in today for review of cellulitis in the left leg posteriorly. I put him on amoxicillin this really hasn't helped as much as I might like. He is also worried because he is traveling to [facility] trauma by car. Finally we will be rewrapping him. There is no open area on the right leg over his left leg has multiple weeping areas as usual 02/09/18; The same wrap on for 10 days. He did not pick up the last doxycycline I prescribed for him. He  apparently took 4 days worth he already had. There is nothing open on his right leg and the edema control is really quite good. He's had damage in the left leg medially and laterally especially probably related to the prolonged use of Unna boots 02/12/18; the patient arrived in clinic today for a nurse visit/wrap change. He complained of a lot of pain in the left posterior calf. He is taking doxycycline that I previously prescribed for him. Unfortunately even though he used his stockings and apparently used to compression pumps twice a day he has weeping edema coming out of the lateral part of his right leg. This is coming from the lower anterior lateral skin area. 02/16/18; the patient has finished his doxycycline and will finish the amoxicillin 2 days. The area of cellulitis in the left calf posteriorly has resolved. He is no longer having any pain. He tells me he is using his compression pumps at least once a day sometimes twice. 02/23/18; the patient finished his doxycycline and Amoxil last week. On Friday he noticed a small erythematous circle about the size of a quarter on the left lower leg just above his ankle. This rapidly expanded and he now has erythema on the lateral and posterior part of the thigh. This is bright red. Also has an area on the dorsal foot just above his toes and a tender area just below the left popliteal fossa. He came off his prophylactic penicillin injections at his own insistence one or 2 months ago. This is obviously deteriorated since then 03/02/18; patient is on doxycycline and Amoxil. Culture I did last week of the weeping area on the back of his left calf grew group B strep. I have therefore renewed the amoxicillin 500 3 times a day for a further week. He has not been systemically unwell. Still complaining of an area of discomfort right under his left popliteal fossa. There is no open wound on the right leg. He tells me that he is using his pumps twice a day on  most days 03/09/18; patient arrives in clinic today completing his amoxicillin today. The cellulitis on his left leg is better. Furthermore  he tells me that he had intramuscular penicillin shots that his primary care office today. However he also states that the wrap on his right leg fell down shortly after leaving clinic last week. He developed a large blister that was present when he came in for a nurse visit later in the week and then he developed intense discomfort around this area.He tells me he is using his compression pumps 03/16/18; the patient has completed his doxycycline. The infectious part of this/cellulitis in the left heel area left popliteal area is a lot better. He has 2 open areas on the right calf. Still areas on the left calf but this is a lot better as well. 03/24/18; the patient arrives complaining of pain in the left popliteal area again. He thinks some of this is wrap injury. He has no open area on the right leg and really no open area on the left calf either except for the popliteal area. He claims to be compliant with the compression pumps 03/31/18; I gave him doxycycline last week because of cellulitis in the left popliteal area. This is a lot better although the surface epithelium is denuded off and response to this. He arrives today with uncontrolled edema in the right calf area as well as a fingernail injury in the right lateral calf. There is only a few open areas on the left 04/06/18; I gave him amoxicillin doxycycline over the last 2 weeks that the amoxicillin should be completing currently. He is not complaining of any pain or systemic symptoms. The only open areas see has is on the right lateral lower leg paradoxically I cannot see anything on the left lower leg. He tells me he is using his compression pumps twice a day on most days. Silver alginate to the wounds that are open under 4 layer compression 04/13/18; he completed antibiotics and has no new complaints. Using  his compression pumps. Silver alginate that anything that's opened 04/20/18; he is using his compression pumps religiously. Silver alginate 4 layer compression anything that's opened. He comes in today with no open wounds on the left leg but 3 on the right including a new one posteriorly. He has 2 on the right lateral and one on the right posterior. He likes Unna boots on the right leg for reasons that aren't really clear we had the usual 4 layer compression on the left. It may be necessary to move to the 4 layer compression on the right however for now I left them in the Unna boots 04/27/18; he is using his compression pumps at least once a day. He has still the wounds on the right lateral calf. The area right posteriorly has closed. He does not have an open wound on the left under 4 layer compression however on the dorsal left foot just proximal to the toes and the left third toe 2 small open areas were identified 05/11/18; he has not uses compression pumps. The areas on the right lateral calf have coalesced into one large wound necrotic surface. On the left side he has one small wound anteriorly however the edema is now weeping out of a large part of his left leg. He says he wasn't using his pumps because of the weeping fluid. I explained to him that this is the time he needs to pump more 05/18/18; patient states he is using his compression pumps twice a day. The area on the right lateral large wound albeit superficial. On the left side he has innumerable number of  small new wounds on the left calf particularly laterally but several anteriorly and medially. All these appear to have healthy granulated base these look like the remnants of blisters however they occurred under compression. The patient arrives in clinic today with his legs somewhat better. There is certainly less edema, less multiple open areas on the left calf and the right anterior leg looks somewhat better as well superficial and a  little smaller. However he relates pain and erythema over the last 3-4 days in the thigh and I looked at this today. He has not been systemically unwell no fever no chills no change in blood sugar values 05/25/18; comes in today in a better state. The severe cellulitis on his left leg seems better with the Keflex. Not as tender. He has not been systemically unwell Hard to find an open wound on the left lower leg using his compression pumps twice a day The confluent wounds on his right lateral calf somewhat better looking. These will ultimately need debridement I didn't do this today. 06/01/18; the severe cellulitis on the left anterior thigh has resolved and he is completed his Keflex. There is no open wound on the left leg however there is a superficial excoriation at the base of the third toe dorsally. Skin on the bottom of his left foot is macerated looking. The left the wounds on the lateral right leg actually looks some better although he did require debridement of the top half of this wound area with an open curet 06/09/18 on evaluation today patient appears to be doing poorly in regard to his right lower extremity in particular this appears to likely be infected he has very thick purulent discharge along with a bright green tent to the discharge. This makes me concerned about the possibility of pseudomonas. He's also having increased discomfort at this point on evaluation. Fortunately there does not appear to be any evidence of infection spreading to the other location at this time. 06/16/18 on evaluation today patient appears to actually be doing fairly well. His ulcer has actually diminished in size quite significantly at this point which is good news. Nonetheless he still does have some evidence of infection he did see infectious disease this morning before coming here for his appointment. I did review the results of their evaluation and their note today. They did actually have him  discontinue the Cipro and initiate treatment with linezolid at this time. He is doing this for the next seven days and they recommended a follow-up in four months with them. He is the keep a log of the need for intermittent antibiotic therapy between now and when he falls back up with infectious disease. This will help them gaze what exactly they need to do to try and help them out. 06/23/18; the patient arrives today with no open wounds on the left leg and left third toe healed. He is been using his compression pumps twice a day. On the right lateral leg he still has a sizable wound but this is a lot better than last time I saw this. 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 Has been using silver alginate under 4 layer compression 06/30/18; the only open wound he has is on the right lateral leg and this looks healthy. No debridement is required. We have been using silver alginate. He does not have an open wound on the left leg. There  is apparently some drainage from the dorsal proximal third toe on the left although I see no open wound here. 07/03/18 on evaluation today patient was actually here just for a nurse visit rapid change. However when he was here on Wednesday for his rat change due to having been healed on the left and then developing blisters we initiated the wrap again knowing that he would be back today for us to reevaluate and see were at. Unfortunately he has developed some cellulitis into the proximal portion of his right lower extremity even into the region of his thigh. 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 He was placed on one as what at that point. Nonetheless he is done with that and has been tolerating it well otherwise. Doxycycline which in the past really did not seem to be effective for him. Nonetheless I think the best  option may be for us to definitely reinitiate the antibiotics for a longer period of time. 07/07/18; since I last saw this patient a week ago he has had a difficult time. At that point he did not have an open wound on his left leg. We transitioned him into juxta light stockings. He was apparently in the clinic the next day with blisters on the left lateral and left medial lower calf. He also had weeping edema fluid. He was put back into a compression wrap. He was also in the clinic on Friday with intense erythema in his right thigh. Per the patient he was started on Bactrim however that didn't work at all in terms of relieving his pain and swelling. He has taken 3 doxycycline that he had left over from last time and that seems to of helped. He has blistering on the right thigh as well. 07/14/18; the erythema on his right thigh has gotten better with doxycycline that he is finishing. 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 Presumably this cellulitis in the thigh was not related to that although I think this is a bit concerning going forward. He still has an area on the right lateral calf the blister on the right medial calf just below the knee that was discussed above. On the left 2 small open areas left medial and left lateral. Edema control is adequate. He is using his compression pumps twice a day 07/20/18; continued improvement in the condition of both legs especially the edema in his bilateral thighs. He tells me he is been losing weight through a combination of diet and exercise. He is using his compression pumps twice a day. So overall she made to the remaining wounds 07/27/2018; continued improvement in condition of both legs. His edema is well controlled. The area on the right lateral leg is just about closed he had one blisters show up on the medial left upper calf. We have him in 4 layer compression. He is going on  a 10-day trip to [place], [place] and [place]. He will be driving. He wants to wear Unna boots because of the lessening amount of constriction. He will not use compression pumps while he is away 08/05/18 on evaluation today patient actually appears to be doing decently well all things considered in regard to his bilateral lower extremities. The worst ulcer is actually only posterior aspect of his left lower extremity with a four layer compression wrap cut into his leg a couple weeks  back. He did have a trip and actually had [employer] for the trip that he is worn since he was last here. Nonetheless he feels like the [employer] actually do better for him his swelling is up a little bit but he also with his trip was not taking his Lasix on a regular set schedule like he was supposed to be. He states that obviously the reason being that he cannot drive and keep going without having to urinate too frequently which makes it difficult. He did not have his pumps with him while he was away either which I think also maybe playing a role here too. 08/13/2018; the patient only has a small open wound on the right lateral calf which is a big improvement in the last month or 2. He also has the area posteriorly just below the posterior fossa on the left which I think was a wrap injury from several weeks ago. He has no current evidence of cellulitis. He tells me he is back into his compression pumps twice a day. He also tells me that while he was at the [place] somebody stole a section of his extremitease stockings 08/20/2018; back in the clinic with a much improved state. He only has small areas on the right lateral mid calf which is just about healed. This was is more substantial area for quite a prolonged period of time. He has a small open area on the left anterior tibia. The area on the posterior calf just below the popliteal fossa is closed today. He is using his compression pumps twice a  day 08/28/2018; patient has no open wound on the right leg. He has a smattering of open areas on the calf with some weeping lymphedema. More problematically than that it looks as though his wraps of slipped down in his usual he has very angry upper area of edema just below the right medial knee and on the right lateral calf. He has no open area on his feet. The patient is traveling to [facility] next week. I will send him in an antibiotic. We will continue to wrap the right leg. We ordered extremitease stockings for him last week and I plan to transition the right leg to a stocking when he gets home which will be in 10 days time. As usual he is very reluctant to take his pumps with him when he travels 09/07/2018; patient returns from [facility]. He shows me a picture of his left leg in the mid part of his trip last week with intense fire engine erythema. The picture look bad enough I would have considered sending him to the hospital. Instead he went to the wound care center in [facility]. They did not prescribe him antibiotics but he did take some doxycycline he had leftover from a previous visit. I had given him trimethoprim sulfamethoxazole before he left this did not work according to the patient. This is resulted in some improvement fortunately. He comes back with a large wound on the left posterior calf. Smaller area on the left anterior tibia. Denuded blisters on the dorsal left foot over his toes. Does not have much in the way of wounds on the right leg although he does have a very tender area on the right posterior area just below the popliteal fossa also suggestive of infection. He promises me he is back on his pumps twice a day 09/15/2018; the intense cellulitis in his left lower calf is a lot better.  The wound area on the posterior left calf is also so better. However he has reasonably extensive wounds on the dorsal aspect of his second and third toes and the proximal  foot just at the base of the toes. There is nothing open on the right leg 09/22/2018; the patient has excellent edema control in his legs bilaterally. He is using his external compression pumps twice a day. He has no open area on the right leg and only the areas in the left foot dorsally second and third toe area on the left side. He does not have any signs of active cellulitis. 10/06/2018; the patient has good edema control bilaterally. He has no open wound on the right leg. There is a blister in the posterior aspect of his left calf that we had to deal with today. He is using his compression pumps twice a day. There is no signs of active cellulitis. We have been using silver alginate to the wound areas. He still has vulnerable areas on the base of his left first second toes dorsally He has a his extremities stockings and we are going to transition him today into the stocking on the right leg. He is cautioned that he will need to continue to use the compression pumps twice a day. If he notices uncontrolled edema in the right leg he may need to go to 3 times a day. 10/13/2018; the patient came in for a nurse check on Friday he has a large flaccid blister on the right medial calf just below the knee. We unroofed this. He has this and a new area underneath the posterior mid calf which was undoubtedly a blister as well. He also has several small areas on the right which is the area we put his extremities stocking on. 10/19/2018; the patient went to see infectious disease this morning I am not sure if that was a routine follow-up in any case the doxycycline I had given him was discontinued and started on linezolid. He has not started this. It is easy to look at his left calf and the inflammation and think this is cellulitis however he is very tender in the tissue just below the popliteal fossa and I have no doubt that there is infection going on here. He states the problem he is having is that with the  compression pumps the edema goes down and then starts walking the wrap falls down. We will see if we can adhere this. He has 1 or 2 minuscule open areas on the right still areas that are weeping on the posterior left calf, the base of his left second and third toes 10/26/18; back today in clinic with quite of skin breakdown in his left anterior leg. This may have been infection the area below the popliteal fossa seems a lot better however tremendous epithelial loss on the left anterior mid tibia area over quite inexpensive tissue. He has 2 blisters on the right side but no other open wound here. 10/29/2018; came in urgently to see [place] today and we worked him in for review. He states that the 4 layer compression on the right leg caused pain he had to cut it down to roughly his mid calf this caused swelling above the wrap and he has blisters and skin breakdown today. As a result of the pain he has not been using his pumps. Both legs are a lot more edematous and there is a lot of weeping fluid. 11/02/18; arrives in clinic with continued difficulties in  the right leg> left. Leg is swollen and painful. multiple skin blisters and new open areas especially laterally. He has not been using his pumps on the right leg. 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 He is not systemically unwell. 11/09/2018; the patient claims he is being compliant with his pumps. He is finished the doxycycline I gave him last week. Culture I did of the wound on the right lateral leg showed a few very resistant methicillin staph aureus. This was resistant to doxycycline. Nevertheless he states the pain in the leg is a lot better which makes me wonder if the cultured organism was not really what was causing the problem nevertheless this is a very dangerous organism to be culturing out of any wound. His right leg is still a lot larger than the left. He is using an [employer] boot on this area,  he blames a 4-layer compression for causing the original skin breakdown which I doubt is true however I cannot talk him out of it. We have been using silver alginate to all of these areas which were initially blisters 11/16/2018; patient is being compliant with his external compression pumps at twice a day. Miraculously he arrives in clinic today with absolutely no open wounds. He has better edema control on the left where he has been using 4 layer compression versus wound of wounds on the right and I pointed this out to him. There is no inflammation in the skin in his lower legs which is also somewhat unusual for him. There is no open wounds on the dorsal left foot. He has extremitease stockings at home and I have asked him to bring these in next week. 11/25/18 patient's lower extremity on examination today on the left appears for the most part to be wound free. He does have an open wound on the lateral aspect of the right lower extremity but this is minimal compared to what I've seen in past. He does request that we go ahead and wrap the left leg as well even though there's nothing open just so hopefully it will not reopen in short order. 1/28; patient has superficial open wounds on the right lateral calf left anterior calf and left posterior calf. His edema control is adequate. 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 We have been using silver alginate as the primary dressing. He claims compliance with his compression pumps 2/4; patient has superficial open wounds on numerous areas of his left calf and again one on the left dorsal foot. The areas on the right lateral calf have healed. The cellulitis that I gave him doxycycline for last week is also resolved this was mostly on the left anterior calf just below the tibial tuberosity. His edema looks fairly well-controlled. He tells me he went to see his primary  doctor today and had blood work ordered 2/11; once again he has several open areas on the left calf left tibial area. Most of these are small and appear to have healthy granulation. He does not have anything open on the right. The edema and control in his thighs is pretty good which is usually a good indication he has been using his pumps as requested. 2/18; he continues to have several small areas on the left calf and left tibial area. Most of these are small healthy granulation. We put him in his stocking on the right  leg last week and he arrives with a superficial open area over the right upper tibia and a fairly large area on the right lateral tibia in similar condition. His edema control actually does not look too bad, he claims to be using his compression pumps twice a day 2/25. Continued small areas on the left calf and left tibial area. New areas especially on the right are identified just below the tibial tuberosity and on the right upper tibia itself. There are also areas of weeping edema fluid even without an obvious wound. He does not have a considerable degree of lymphedema but clearly there is more edema here than his skin can handle. He states he is using the pumps twice a day. We have an Unna boot on the right and 4 layer compression on the left. 3/3; he continues to have an area on the right lateral calf and right posterior calf just below the popliteal fossa. There is a fair amount of tenderness around the wound on the popliteal fossa but I did not see any evidence of cellulitis, could just be that the wrap came down and rubbed in this area. He does not have an open area on the left leg however there is an area on the left dorsal foot at the base of the third toe We have been using silver alginate to all wound areas 3/10; he did not have an open area on his left leg last time he was here a week ago. Today he arrives with a horizontal wound just below the tibial tuberosity and an  area on the left lateral calf. He has intense erythema and tenderness in this area. The area is on the right lateral calf and right posterior calf better than last week. We have been using silver alginate as usual 3/18 - Patient returns with 3 small open areas on left calf, and 1 small open area on right calf, the skin looks ok with no significant erythema, he continues the UNA boot on right and 4 layer compression on left. The right lateral calf wound is closed , the right posterior is small area. we will continue silver alginate to the areas. 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 He is been tolerating the dressing changes without complication. Fortunately she has made excellent improvement in regard to the overall status of his wounds. Unfortunately every time we cease wrapping him he ends up reopening in causing more significant issues at that point. Again I'm unsure of the best direction to take although I think the lymphedema clinic may be appropriate for him. 02/03/19 on evaluation today patient appears to be doing well in regard to the wounds that we saw him for last week unfortunately he has a new area on the proximal portion of his right medial/posterior lower extremity where the wrap somewhat slowed down and caused swelling and a blister to rub and open. Unfortunately this is the only opening that he has on either leg at this point. 02/17/19 on evaluation today patient's bilateral lower extremities appear to be doing well. He still completely healed in regard to the left lower extremity. In regard to the right lower extremity the area where the wrap and slid down and caused the blister still seems to be slightly open although this is dramatically better than during the  last evaluation two  weeks ago. I'm very pleased with the way this stands overall. 03/03/19 on evaluation today patient appears to be doing well in regard to his right lower extremity in general although he did have a new blister open this does not appear to be showing any evidence of active infection at this time. Fortunately there's No fevers, chills, nausea, or vomiting noted at this time. Overall I feel like he is making good progress it does feel like that the right leg will we perform the [employer] seems to do with a bit better than three layer wrap on the left which slid down on him. We may switch to doing bilateral in the book wraps. 5/4; I have not seen Mr. Metter in quite some time. According to our case manager he did not have an open wound on his left leg last week. He had 1 remaining wound on the right posterior medial calf. He arrives today with multiple openings on the left leg probably were blisters and/or wrap injuries from Unna boots. I do not think the Unna boot's will provide adequate compression on the left. I am also not clear about the frequency he is using the compression pumps. 03/17/19 on evaluation today patient appears to be doing excellent in regard to his lower extremities compared to last week's evaluation apparently. He had gotten significantly worse last week which is unfortunate. The [employer] wrap on the left did not seem to do very well for him at all and in fact it didn't control his swelling significantly enough he had an additional outbreak. Subsequently we go back to the four layer compression wrap on the left. This is good news. At least in that he is doing better and the wound seem to be killing him. He still has not heard anything from the lymphedema clinic. 03/24/19 on evaluation today patient actually appears to be doing much better in regard to his bilateral lower Trinity as compared to last week when I saw him. Fortunately there's no signs of active infection at this time. He  has been tolerating the dressing changes without complication. Overall I'm extremely pleased with the progress and appearance in general. 04/07/19 on evaluation today patient appears to be doing well in regard to his bilateral lower extremities. His swelling is significantly down from where it was previous. With that being said he does have a couple blisters still open at this point but fortunately nothing that seems to be too severe and again the majority of the larger openings has healed at this time. 04/14/19 on evaluation today patient actually appears to be doing quite well in regard to his bilateral lower extremities in fact I'm not even sure there's anything significantly open at this time at any site. Nonetheless he did have some trouble with these wraps where they are somewhat irritating him secondary to the fact that he has noted that the graph wasn't too close down to the end of this foot in a little bit short as well up to his knee. Otherwise things seem to be doing quite well. 04/21/19 upon evaluation today patient's wound bed actually showed evidence of being completely healed in regard to both lower extremities which is excellent news. There does not appear to be any signs of active infection which is also good news. I'm very pleased in this regard. No fevers, chills, nausea, or vomiting noted at this time. 04/28/19 on evaluation today patient appears to be doing a little bit worse in regard  to both lower extremities on the left mainly due to the fact that when he went infection disease the wrap was not wrapped quite high enough he developed a blister above this. On the right he is a small open area of nothing too significant but again this is continuing to give him some trouble he has been were in the Velcro compression that he has at home. 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 He's been tolerating the dressing changes  without complication. Fortunately there's no signs of active infection at this time. No fevers, chills, nausea, or vomiting noted at this time. We have been trying to get an appointment with her lymphedema clinic in [facility] but unfortunately nobody can get them on phone with not been able to even fax information over the patient likewise is not been able to get in touch with them. Overall I'm not sure exactly what's going on here with to reach out again today. 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 Still having a lot of drainage unfortunately. He tells me especially in the left but even on the right. There's no signs of active infection which is good news we've been using so ratcheted up to this point. 05/19/19 on evaluation today patient actually appears to be doing quite well with regard to his left lower extremity which is great news. Fortunately in regard to the right lower extremity has an issues with his wrap and he subsequently did remove this from what I'm understanding. Nonetheless long story short is what he had rewrapped once he removed it subsequently had maggots underneath this wrap whenever he came in for evaluation today. With that being said they were obviously completely cleaned away by the nursing staff. The visit today which is excellent news. However he does appear to potentially have some infection around the right ankle region where the maggots were located as well. He will likely require anabiotic therapy today. 05/26/19 on evaluation today patient actually appears to be doing much better in regard to his bilateral lower extremities. I feel like the infection is under much better control. With that being said there were maggots noted when the wrap was removed yet again today. Again this could have potentially been left over from previous although at this time there does  not appear to be any signs of significant drainage there was obviously on the wrap some drainage as well this contracted gnats or otherwise. Either way I do not see anything that appears to be doing worse in my pinion and in fact I think his drainage has slowed down quite significantly likely mainly due to the fact to his infection being under better control. 06/02/2019 on evaluation today patient actually appears to be doing well with regard to his bilateral lower extremities there is no signs of active infection at this time which is great news. With that being said he does have several open areas more so on the right than the left but nonetheless these are all significantly better than previously noted. 06/09/2019 on evaluation today patient actually appears to be doing well. His wrap stayed up and he did not cause any problems he had more drainage on the right compared to the left but overall I do not see any major issues at this time which is great news. 06/16/2019 on evaluation today patient appears to be  doing excellent with regard to his lower extremities the only area that is open is a new blister that can have opened as of today on the medial ankle on the left. Other than this he really seems to be doing great I see no major issues at this point. 06/23/2019 on evaluation today patient appears to be doing quite well with regard to his bilateral lower extremities. In fact he actually appears to be almost completely healed there is a small area of weeping noted of the right lower extremity just above the ankle. Nonetheless fortunately there is no signs of active infection at this time which is good news. No fevers, chills, nausea, vomiting, or diarrhea. 8/24; the patient arrived for a nurse visit today but complained of very significant pain in the left leg and therefore I was asked to look at this. Noted that he did not have an open area on the left leg last week nevertheless this was wrapped.  The patient states that he is not been able to put his compression pumps on the left leg because of the discomfort. He has not been systemically unwell 06/30/2019 on evaluation today patient unfortunately despite being excellent last week is doing much worse with regard to his left lower extremity today. In fact he had to come in for a nurse on Monday where his left leg had to be rewrapped due to excessive weeping Dr. Leanord Hawking placed him on doxycycline at that point. Fortunately there is no signs of active infection Systemically at this time which is good news. 07/07/2019 in regard to the patient's wounds today he actually seems to be doing well with his right lower extremity there really is nothing open or draining at this point this is great news. Unfortunately the left lower extremity is given him additional trouble at this time. There does not appear to be any signs of active infection nonetheless he does have a lot of edema and swelling noted at this point as well as blistering all of which has led to a much more poor appearing leg at this time compared to where it was 2 weeks ago when it was almost completely healed. Obviously this is a little discouraging for the patient. He is try to contact the lymphedema clinic in [place] he has not been able to get through to them. 07/14/2019 on evaluation today patient actually appears to be doing slightly better with regard to his left lower extremity ulcers. Overall I do feel like at least at the top of the wrap that we have been placing this area has healed quite nicely and looks much better. The remainder of the leg is showing signs of improvement. Unfortunately in the thigh area he still has an open region on the left and again on the right he has been utilizing just a Band-Aid on an area that also opened on the thigh. Again this is an area that were not able to wrap although we did do an Ace wrap to provide some compression that something that  obviously is a little less effective than the compression wraps we have been using on the lower portion of the leg. He does have an appointment with the lymphedema clinic in [facility] on Friday. 07/21/2019 on evaluation today patient appears to be doing better with regard to his lower extremity ulcers. He has been tolerating the dressing changes without complication. Fortunately there is no signs of active infection at this time. No fevers, chills, nausea, vomiting, or diarrhea. I did receive  the paperwork from the physical therapist at the lymphedema clinic in [place]. Subsequently I signed off on that this morning and sent that back to him for further progression with the treatment plan. 07/28/2019 on evaluation today patient appears to be doing very well with regard to his right lower extremity where I do not see any open wounds at this point. Fortunately he is feeling great as far as that is concerned as well. In regard to the left lower extremity he has been having issues with still several areas of weeping and edema although the upper leg is doing better his lower leg still I think is going require the compression wrap at this time. No fevers, chills, nausea, vomiting, or diarrhea. 08/04/2019 on evaluation today patient unfortunately is having new wounds on the right lower extremity. Again we have been using Unna boot wrap on that side. We switched him to using his juxta lite wrap at home. With that being said he tells me he has been using it although his legs extremely swollen and to be honest really does not appear that he has been. I cannot know that for sure however. Nonetheless he has multiple new wounds on the right lower extremity at this time. Obviously we will have to see about getting this rewrapped for him today. 08/11/2019 on evaluation today patient appears to be doing fairly well with regard to his wounds. He has been tolerating the dressing changes including the  compression wraps without complication. He still has a lot of edema in his upper thigh regions bilaterally he is supposed to be seeing the lymphedema clinic on the 15th of this month once his wraps arrive for the upper part of his legs. 08/18/2019 on evaluation today patient appears to be doing well with regard to his bilateral lower extremities at this point. He has been tolerating the dressing changes without complication. Fortunately there is no signs of active infection which is also good news. He does have a couple weeping areas on the first and second toe of the right foot he also has just a small area on the left foot upper leg and a small area on the left lower leg but overall he is doing quite well in my opinion. He is supposed to be getting his wraps shortly in fact tomorrow and then subsequently is seeing the lymphedema clinic next Wednesday on the 21st. Of note he is also leaving on the 25th to go on vacation for a week to the beach. For that reason and since there is some uncertainty about what there can be doing at lymphedema clinic next Wednesday I am get a make an appointment for next Friday here for [place] to see what we need to do for him prior to him leaving for vacation. 10/23; patient arrives in considerable pain predominantly in the upper posterior calf just distal to the popliteal fossa also in the wound anteriorly above the major wound. This is probably cellulitis and he has had this recurrently in the past. He has no open wound on the right side and he has had an Radio broadcast assistant in that area. Finally I note that he has an area on the left posterior calf which by enlarge is mostly epithelialized. This protrudes beyond the borders of the surrounding skin in the setting of dry scaly skin and lymphedema. The patient is leaving for [facility] on Sunday. Per his longstanding pattern, he will not take his compression pumps with him predominantly out of fear that they  will be stolen. He  therefore asked that we put a Unna boot back on the right leg. He will also contact the wound care center in [facility] to see if they can change his dressing in the mid week. 11/3; patient returned from his vacation to [facility]. He was seen on 1 occasion at their wound care center. They did a 2 layer compression system as they did not have our 4-layer wrap. I am not completely certain what they put on the wounds. They did not change the Unna boot on the right. The patient is also seeing a lymphedema specialist physical therapist in [place]. It appears that he has some compression sleeve for his thighs which indeed look quite a bit better than I am used to seeing. He pumps over these with his external compression pumps. 11/10; the patient has a new wound on the right medial thigh otherwise there is no open areas on the right. He has an area on the left leg posteriorly anteriorly and medially and an area over the left second toe. We have been using silver alginate. He thinks the injury on his thigh is secondary to friction from the compression sleeve he has. 11/17; the patient has a new wound on the right medial thigh last week. He thinks this is because he did not have a underlying stocking for his thigh juxta lite apparatus. He now has this. The area is fairly large and somewhat angry but I do not think he has underlying cellulitis. He has a intact blister on the right anterior tibial area. Small wound on the right great toe dorsally Small area on the medial left calf. 11/30; the patient does not have any open areas on his right leg and we did not take his juxta lite stocking off. However he states that on Friday his compression wrap fell down lodging around his upper mid calf area. As usual this creates a lot of problems for him. He called urgently today to be seen for a nurse visit however the nurse visit turned into a provider visit because of extreme erythema and pain in the  left anterior tibia extending laterally and posteriorly. The area that is problematic is extensive 10/06/2019 upon evaluation today patient actually appears to be doing poorly in regard to his left lower extremity. He Dr. Leanord Hawking did place him on doxycycline this past Monday apparently due to the fact that he was doing much worse in regard to this left leg. Fortunately the doxycycline does seem to be helping. Unfortunately we are still having a very difficult time getting his edema under any type of control in order to anticipate discharge at some point. The only way were really able to control his lymphedema really is with compression wraps and that has only even seemingly temporary. He has been seeing a lymphedema clinic they are trying to help in this regard but still this has been somewhat frustrating in general for the patient. 10/13/19 on evaluation today patient appears to be doing excellent with regard to his right lower extremity as far as the wounds are concerned. His swelling is still quite extensive unfortunately. He is still having a lot of drainage from the thigh areas bilaterally which is unfortunate. He's been going to lymphedema clinic but again he still really does not have this edema under control as far as his lower extremities are concern. With regard to his left lower extremity this seems to be improving and I do believe the doxycycline has been  of benefit for him. He is about to complete the doxycycline. 10/20/2019 on evaluation today patient appears to be doing poorly in regard to his bilateral lower extremities. More in the right thigh he has a lot of irritation at this site unfortunately. In regard to the left lower extremity the wrap was not quite as high it appears and does seem to have caused him some trouble as well. Fortunately there is no evidence of systemic infection though he does have some blue-green drainage which has me concerned for the possibility of  Pseudomonas. He tells me he is previously taking Cipro without complications and he really does not care for Levaquin however due to some of the side effects he has. He is not allergic to any medications specifically antibiotics that were aware of. 10/27/2019 on evaluation today patient actually does appear to be for the most part doing better when compared to last week's evaluation. With that being said he still has multiple open wounds over the bilateral lower extremities. He actually forgot to start taking the Cipro and states that he still has the whole bottle. He does have several new blisters on left lower extremity today I think I would recommend he go ahead and take the Cipro based on what I am seeing at this point. 12/30-Patient comes at 1 week visit, 4 layer compression wraps on the left and Unna boot on the right, primary dressing Xtrasorb and silver alginate. Patient is taking his Cipro and has a few more days left probably 5-6, and the legs are doing better. He states he is using his compressions devices which I believe he has 11/10/2019 on evaluation today patient actually appears to be much better than last time I saw him 2 weeks ago. His wounds are significantly improved and overall I am very pleased in this regard. Fortunately there is no signs of active infection at this time. He is just a couple of days away from completing Cipro. Overall his edema is much better he has been using his lymphedema pumps which I think is also helping at this point. 11/17/2019 on evaluation today patient appears to be doing excellent in regard to his wounds in general. His legs are swollen but not nearly as much as they have been in the past. Fortunately he is tolerating the compression wraps without complication. No fevers, chills, nausea, vomiting, or diarrhea. He does have some erythema however in the distal portion of his right lower extremity specifically around the forefoot and toes there is a  little bit of warmth here as well. 11/24/2019 on evaluation today patient appears to be doing well with regard to his right lower extremity I really do not see any open wounds at this point. His left lower extremity does have several open areas and his right medial thigh also is open. Other than this however overall the patient seems to be making good progress and I am very pleased at this point. 12/01/2019 on evaluation today patient appears to be doing poorly at this point in regard to his left lower extremity has several new blisters despite the fact that we have him in compression wraps. In fact he had a 4-layer compression wrap, his upper thigh wrapped from lymphedema clinic, and a juxta light over top of the 4 layer compression wrap the lymphedema clinic applied and despite all this he still develop blisters underneath. Obviously this does have me concerned about the fact that unfortunately despite what we are doing to try to get wounds  healed he continues to have new areas arise I do not think he is ever good to be at the point where he can realistically just use wraps at home to keep things under control. Typically when we heal him it takes about 1-2 days before he is back in the clinic with severe breakdown and blistering of his lower extremities bilaterally. This is happened numerous times in the past. Unfortunately I think that we may need some help as far as overall fluid overload to kind of limit what we are seeing and get things under better control. 12/08/2019 on evaluation today patient presents for follow-up concerning his ongoing bilateral lower extremity edema. Unfortunately he is still having quite a bit of swelling the compression wraps are controlling this to some degree but he did see Dr. [name] his cardiologist I do have that available for review today as far as the appointment was concerned that was on 12/06/2019. Obviously that she has been 2 days ago. The patient states that he  is only been taking the Lasix 80 mg 1 time a day he had told me previously he was taking this twice a day. Nonetheless Dr. [name] recommended this be up to 80 mg 2 times a day for the patient as he did appear to be fluid overloaded. With that being said the patient states he did this yesterday and he was unable to go anywhere or do anything due to the fact that he was constantly having to urinate. Nonetheless I think that this is still good to be something that is important for him as far as trying to get his edema under control at all things that he is going to be able to just expect his wounds to get under control and things to be better without going through at least a period of time where he is trying to stabilize his fluid management in general and I think increasing the Lasix is likely the first step here. It was also mentioned the possibility that the patient may require metolazone. With that being said he wanted to have the patient take Lasix twice a day first and then reevaluating 2 months to see where things stand. 12/15/2019 upon evaluation today patient appears to be doing regard to his legs although his toes are showing some signs of weeping especially on the left at this point to some degree on the right. There does not appear to be any signs of active infection and overall I do feel like the compression wraps are doing well for him but he has not been able to take the Lasix at home and the increased dose that Dr. [name] recommended. He tells me that just not go to be feasible for him. Nonetheless I think in this case he should probably send a message to Dr. [name] in order to discuss options from the standpoint of possible admission to get the fluid off or otherwise going forward. 12/22/2019 upon evaluation today patient appears to be doing fairly well with regard to his lower extremities at this point. In fact he would be doing excellent if it was not for the fact that his right anterior  thigh apparently had an allergic reaction to adhesive tape that he used. The wound itself that we have been monitoring actually appears to be healed. There is a lot of irritation at this point. 12/29/2019 upon evaluation today patient appears to be doing well in regard to his lower extremities. His left medial thigh is open and somewhat draining  today but this is the only region that is open the right has done much better with the treatment utilizing the steroid cream that I prescribed for him last week. Overall I am pleased in that regard. Fortunately there is no signs of active infection at this time. No fevers, chills, nausea, vomiting, or diarrhea. Electronic Signature(s) Signed: 12/29/2019 11:55:27 AM By: Lenda Kelp PA-C Entered By: Lenda Kelp on 12/29/2019 11:55:27 -------------------------------------------------------------------------------- Physical Exam Details Patient Name: Date of Service: [name], [name] 12/29/2019 10:15 AM Medical Record [id number] Patient Account Number: [id number] Date of Birth/Sex: Treating RN: 07/28/1951 (69 y.o. [name] Primary Care Provider: Nicoletta Ba Other Clinician: Referring Provider: Treating Provider/Extender:[name], [name], [name] in Treatment: 205 Constitutional Obese and well-hydrated in no acute distress. Respiratory normal breathing without difficulty. Psychiatric this patient is able to make decisions and demonstrates good insight into disease process. Alert and Oriented x 3. pleasant and cooperative. Notes Patient's wound bed currently again on the right medial thigh has healed the left medial thigh is open which was not last week everything else appears to be closed at this point he still only taking 40 mg of Lasix a day he will not increase this due to having to urinate more frequently. Electronic Signature(s) Signed: 12/29/2019 11:55:47 AM By: Lenda Kelp PA-C Entered By: Lenda Kelp on 12/29/2019 11:55:47 -------------------------------------------------------------------------------- Physician Orders Details Patient Name: Date of Service: [name], [name] 12/29/2019 10:15 AM Medical Record [id number] Patient Account Number: [id number] Date of Birth/Sex: Treating RN: [birth year] (69 y.o. 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 Dressing Change Frequency Wound #158 Left Upper Leg Change Dressing every other day. Skin Barriers/Peri-Wound Care Moisturizing lotion - to both legs daily TCA Cream or Ointment - to right thigh daily Wound Cleansing May shower and wash wound with soap and water. Primary Wound Dressing Wound #158 Left Upper Leg Calcium Alginate with Silver Secondary Dressing Wound #158 Left Upper Leg ABD pad - secure with tape Edema Control 4 layer compression: Left lower extremity Avoid standing for long periods of time Elevate legs to the level of the heart or above for 30 minutes daily and/or when sitting, a frequency of: - throughout the day Exercise regularly Support Garment 20-30 mm/Hg pressure to: - juxtalite or xtremit-ease to right leg daily (tubigrip to foot) Segmental Compressive Device. - [facility]a pumps  60 min 2 times per day Other: - [facility] wraps to upper leg per [facility] clinic Electronic Signature(s) Signed: 12/29/2019 5:55:18 PM By: Lenda Kelp PA-C Signed: 12/29/2019 6:08:21 PM By: Zenaida Deed RN, BSN Entered By: Zenaida Deed on 12/29/2019 11:26:26 -------------------------------------------------------------------------------- Problem List Details Patient Name: Date of Service: [name], [name] 12/29/2019 10:15 AM Medical Record [id number] Patient Account Number: [id number] Date of Birth/Sex: Treating RN: 1951/07/15 (69 y.o. [name] Primary Care Provider: Nicoletta Ba Other Clinician: Referring Provider: Treating Provider/Extender:[name], [name], [name] in Treatment:  205 Active Problems ICD-10 Evaluated Encounter Code Description Active Date Today Diagnosis L97.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 right calf limited to 06/30/2018 No Yes breakdown of skin L97.221 Non-pressure chronic ulcer of left calf limited to 09/30/2016 No Yes breakdown of skin I87.333 Chronic venous hypertension (idiopathic) with ulcer 01/22/2016 No Yes and inflammation of bilateral lower extremity I89.0 Lymphedema, not elsewhere classified 01/22/2016 No Yes E11.622 Type 2 diabetes mellitus with other skin ulcer 01/22/2016 No Yes 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, 01/22/2016 No Yes unspecified L03.116 Cellulitis of left lower limb 04/01/2017 No Yes Inactive Problems ICD-10 Code Description Active Date Inactive Date 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 06/30/2017 skin L97.521 Non-pressure chronic ulcer of other part of left foot limited to 04/27/2018 04/27/2018 breakdown of skin L03.115 Cellulitis of right lower limb 12/22/2017 12/22/2017 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 with other specified 06/30/2018 06/30/2018 severity L97.511 Non-pressure chronic ulcer of other part of right foot  limited to 06/30/2018 06/30/2018 breakdown of skin Resolved Problems Electronic Signature(s) Signed: 12/29/2019 10:29:25 AM By: Lenda Kelp PA-C Entered By: Lenda Kelp on 12/29/2019 10:29:24 -------------------------------------------------------------------------------- Progress Note Details Patient Name: Date of Service: [name], [name] 12/29/2019 10:15 AM Medical Record [id number] Patient Account Number: [id number] Date of Birth/Sex: Treating RN: 1951/01/07 (69 y.o. [name] Primary Care Provider: Nicoletta Ba Other Clinician: Referring Provider: Treating Provider/Extender:[name], [name], [name] in Treatment: 205 Subjective Chief Complaint Information obtained from Patient patient is here for evaluation venous/lymphedema weeping History of Present Illness (HPI) Referred by PCP for consultation. Patient has long standing history of BLE venous stasis, no prior ulcerations. At beginning of month, developed cellulitis and weeping. Received IM Rocephin followed by Keflex and resolved. Wears compression stocking, appr 6 months old. Not sure strength. No present drainage. 01/22/16 this is a patient who is a type II diabetic on insulin. 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 He tells me he religiously wears pressure stockings of uncertain strength. He was here with weeping edema about 8 months ago but did not have an open wound. Roughly a month ago he had a reopening on his bilateral legs. He is been using bandages and Neosporin. He does not complain of pain. 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 Last BUN/creatinine I have is from 11/20/15 at 13 and 1.0 respectively 01/29/16; patient arrives today having tolerated the Profore wrap. He brought in his stockings and these are 18 mmHg stockings he bought from  [employer]. The compression here is likely inadequate. He does not complain of pain or excessive drainage she has no systemic symptoms. The wound on the right looks improved as does the one on the left although one on the left is more substantial with still tissue at risk below the actual wound area on the bilateral posterior calf 02/05/16; patient arrives with poor edema control. He states that we did put a 4 layer compression on it last week. No weight appear 5 this. 02/12/16; the area on the posterior right Has healed. The left Has a substantial wound that has necrotic surface eschar that requires a debridement with a curette. 02/16/16;the patient called or a Nurse visit secondary to increased swelling. He had been in earlier in the week with his right leg healed. He was transitioned to is on pressure stocking on the  right leg with the only open wound on the left, a substantial area on the left posterior calf. Note he has a history of severe lower extremity edema, he has a history of chronic atrial fibrillation but not heart failure per my notes but I'll need to research this. He is not complaining of chest pain shortness of breath or orthopnea. The intake nurse noted blisters on the previously closed right leg 02/19/16; this is the patient's regular visit day. I see him on Friday with escalating edema new wounds on the right leg and clear signs of at least right ventricular heart failure. I increased his Lasix to 40 twice a day. He is returning currently in follow-up. States he is noticed a decrease in that the edema 02/26/16 patient's legs have much less edema. There is nothing really open on the right leg. The left leg has improved condition of the large superficial wound on the posterior left leg 03/04/16; edema control is very much better. The patient's right leg wounds have healed. On the left leg he continues to have severe venous inflammation on the posterior aspect of the left leg. There is no  tenderness and I don't think any of this is cellulitis. 03/11/16; patient's right leg is married healed and he is in his own stocking. The patient's left leg has deteriorated somewhat. There is a lot of erythema around the wound on the posterior left leg. There is also a significant rim of erythema posteriorly just above where the wrap would've ended there is a new wound in this location and a lot of tenderness. Can't rule out cellulitis in this area. 03/15/16; patient's right leg remains healed and he is in his own stocking. The patient's left leg is much better than last review. His major wound on the posterior aspect of his left Is almost fully epithelialized. He has 3 small injuries from the wraps. Really. Erythema seems a lot better on antibiotics 03/18/16; right leg remains healed and he is in his own stocking. The patient's left leg is much better. The area on the posterior aspect of the left calf is fully epithelialized. His 3 small injuries which were wrap injuries on the left are improved only one seems still open his erythema has resolved 03/25/16; patient's right leg remains healed and he is in his own stocking. There is no open area today on the left leg posterior leg is completely closed up. His wrap injuries at the superior aspect of his leg are also resolved. He looks as though he has some irritation on the dorsal ankle but this is fully epithelialized without evidence of infection. 03/28/16; we discharged this patient on Monday. Transitioned him into his own stocking. There were problems almost immediately with uncontrolled swelling weeping edema multiple some of which have opened. He does not feel systemically unwell in particular no chest pain no shortness of breath and he does not feel 04/08/16; the edema is under better control with the Profore light wrap but he still has pitting edema. There is one large wound anteriorly 2 on the medial aspect of his left leg and 3 small areas on the  superior posterior calf. Drainage is not excessive he is tolerating a Profore light well 04/15/16; put a Profore wrap on him last week. This is controlled is edema however he had a lot of pain on his left anterior foot most of his wounds are healed 04/22/16 once again the patient has denuded areas on the left anterior foot which he states  are because his wrap slips up word. He saw his primary physician today is on Lasix 40 twice a day and states that he his weight is down 20 pounds over the last 3 months. 04/29/16: Much improved. left anterior foot much improved. He is now on Lasix 80 mg per day. Much improved edema control 05/06/16; I was hoping to be able to discharge him today however once again he has blisters at a low level of where the compression was placed last week mostly on his left lateral but also his left medial leg and a small area on the anterior part of the left foot. 05/09/16; apparently the patient went home after his appointment on 7/4 later in the evening developing pain in his upper medial thigh together with subjective fever and chills although his temperature was not taken. The pain was so intense he felt he would probably have to call 911. However he then remembered that he had leftover doxycycline from a previous round of antibiotics and took these. By the next morning he felt a lot better. He called and spoke to one of our nurses and I approved doxycycline over the phone thinking that this was in relation to the wounds we had previously seen although they were definitely were not. The patient feels a lot better old fever no chills he is still working. Blood sugars are reasonably controlled 05/13/16; patient is back in for review of his cellulitis on his anterior medial upper thigh. He is taking doxycycline this is a lot better. Culture I did of the nodular area on the dorsal aspect of his foot grew MRSA this also looks a lot better. 05/20/16; the patient is cellulitis on the  medial upper thigh has resolved. All of his wound areas including the left anterior foot, areas on the medial aspect of the left calf and the lateral aspect of the calf at all resolved. He has a new blister on the left dorsal foot at the level of the fourth toe this was excised. No evidence of infection 05/27/16; patient continues to complain weeping edema. He has new blisterlike wounds on the left anterior lateral and posterior lateral calf at the top of his wrap levels. The area on his left anterior foot appears better. He is not complaining of fever, pain or pruritus in his feet. 05/30/16; the patient's blisters on his left anterior leg posterior calf all look improved. He did not increase the Lasix 100 mg as I suggested because he was going to run out of his 40 mg tablets. He is still having weeping edema of his toes 06/03/16; I renewed his Lasix at 80 mg once a day as he was about to run out when I last saw him. He is on 80 mg of Lasix now. I have asked him to cut down on the excessive amount of water he was drinking and asked him to drink according to his thirst mechanisms 06/12/2016 -- was seen 2 days ago and was supposed to wear his compression stockings at home but he is developed lymphedema and superficial blisters on the left lower extremity and hence came in for a review 06/24/16; the remaining wound is on his left anterior leg. He still has edema coming from between his toes. There is lymphedema here however his edema is generally better than when I last saw this. He has a history of atrial fibrillation but does not have a known history of congestive heart failure nevertheless I think he probably has this at  least on a diastolic basis. 07/01/16 I reviewed his echocardiogram from January 2017. This was essentially normal. He did not have LVH, EF of 55-60%. His right ventricular function was normal although he did have trivial tricuspid and pulmonic regurgitation. This is not audible on  exam however. I increased his Lasix to do massive edema in his legs well above his knees I think in early July. He was also drinking an excessive amount of water at the time. 07/15/16; missed his appointment last week because of the Labor Day holiday on Monday. He could not get another appointment later in the week. Started to feel the wrap digging in superiorly so we remove the top half and the bottom half of his wrap. He has extensive erythema and blistering superiorly in the left leg. Very tender. Very swollen. Edema in his foot with leaking edema fluid. He has not been systemically unwell 07/22/16; the area on the left leg laterally required some debridement. The medial wounds look more stable. His wrap injury wounds appear to have healed. Edema and his foot is better, weeping edema is also better. He tells me he is meeting with the supplier of the external compression pumps at work 08/05/16; the patient was on vacation last week in [facility]. His wrap is been on for an extended period of time. Also over the weekend he developed an extensive area of tender erythema across his anterior medial thigh. He took to doxycycline yesterday that he had leftover from a previous prescription. The patient complains of weeping edema coming out of his toes 08/08/16; I saw this patient on 10/2. He was tender across his anterior thigh. I put him on doxycycline. He returns today in follow-up. He does not have any open wounds on his lower leg, he still has edema weeping into his toes. 08/12/16; patient was seen back urgently today to follow-up for his extensive left thigh cellulitis/erysipelas. He comes back with a lot less swelling and erythema pain is much better. I believe I gave him Augmentin and Cipro. His wrap was cut down as he stated a roll down his legs. He developed blistering above the level of the wrap that remained. He has 2 open blisters and 1 intact. 08/19/16; patient is been doing his primary  doctor who is increased his Lasix from 40-80 once a day or 80 already has less edema. Cellulitis has remained improved in the left thigh. 2 open areas on the posterior left calf 08/26/16; he returns today having new open blisters on the anterior part of his left leg. He has his compression pumps but is not yet been shown how to use some vital representative from the supplier. 09/02/16 patient returns today with no open wounds on the left leg. Some maceration in his plantar toes 09/10/2016 -- Dr. Leanord Hawking had recently discharged him on 09/02/2016 and he has come right back with redness swelling and some open ulcers on his left lower extremity. He says this was caused by trying to apply his compression stockings and he's been unable to use this and has not been able to use his lymphedema pumps. He had some doxycycline leftover and he has started on this a few days ago. 09/16/16; there are no open wounds on his leg on the left and no evidence of cellulitis. 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 He does not complain of symptoms here. I am not clear use using his external compression pumps. 09/23/16; I  have not seen this patient in 2 weeks. He canceled his appointment 10 days ago as he was going on vacation. He tells me that on Monday he noticed a large area on his posterior left leg which is been draining copiously and is reopened into a large wound. He is been using ABDs and the external part of his juxtalite, according to our nurse this was not on properly. 10/07/16; Still a substantial area on the posterior left leg. Using silver alginate 10/14/16; in general better although there is still open area which looks healthy. Still using silver alginate. He reminds me that this happen before he left for [facility]. Today while he was showering in the morning. He had been using his juxtalite's 10/21/16; the area on his posterior left leg is  fully epithelialized. However he arrives today with a large area of tender erythema in his medial and posterior left thigh just above the knee. I have marked the area. Once again he is reluctant to consider hospitalization. I treated him with oral antibiotics in the past for a similar situation with resolution I think with doxycycline however this area it seems more extensive to me. He is not complaining of fever but does have chills and says states he is thirsty. His blood sugar today was in the 140s at home 10/25/16 the area on his posterior left leg is fully epithelialized although there is still some weeping edema. The large area of tenderness and erythema in his medial and posterior left thigh is a lot less tender although there is still a lot of swelling in this thigh. He states he feels a lot better. He is on doxycycline and Augmentin that I started last week. This will continued until Tuesday, December 26. I have ordered a duplex ultrasound of the left thigh rule out DVT whether there is an abscess something that would need to be drained I would also like to know. 11/01/16; he still has weeping edema from a not fully epithelialized area on his left posterior calf. Most of the rest of this looks a lot better. He has completed his antibiotics. His thigh is a lot better. Duplex ultrasound did not show a DVT in the thigh 11/08/16; he comes in today with more Denuded surface epithelium from the posterior aspect of his calf. There is no real evidence of cellulitis. The superior aspect of his wrap appears to have put quite an indentation in his leg just below the knee and this may have contributed. He does not complain of pain or fever. We have been using silver alginate as the primary dressing. The area of cellulitis in the right thigh has totally resolved. He has been using his compression stockings once a week 11/15/16; the patient arrives today with more loss of epithelium from the posterior  aspect of his left calf. He now has a fairly substantial wound in this area. The reason behind this deterioration isn't exactly clear although his edema is not well controlled. He states he feels he is generally more swollen systemically. He is not complaining of chest pain shortness of breath fever. Tells me he has an appointment with his primary physician in early February. He is on 80 mg of oral Lasix a day. He claims compliance with the external compression pumps. He is not having any pain in his legs similar to what he has with his recurrent cellulitis 11/22/16; the patient arrives a follow-up of his large area on his left lateral calf. This looks somewhat better  today. He came in earlier in the week for a dressing change since I saw him a week ago. He is not complaining of any pain no shortness of breath no chest pain 11/28/16; the patient arrives for follow-up of his large area on the left lateral calf this does not look better. In fact it is larger weeping edema. The surface of the wound does not look too bad. We have been using silver alginate although I'm not certain that this is a dressing issue. 12/05/16; again the patient follows up for a large wound on the left lateral and left posterior calf this does not look better. There continues to be weeping edema necrotic surface tissue. More worrisome than this once again there is erythema below the wound involving the distal Achilles and heel suggestive of cellulitis. He is on his feet working most of the day of this is not going well. We are changing his dressing twice a week to facilitate the drainage. 12/12/16; not much change in the overall dimensions of the large area on the left posterior calf. This is very inflamed looking. I gave him an. Doxycycline last week does not really seem to have helped. He found the wrap very painful indeed it seems to of dog into his legs superiorly and perhaps around the heel. He came in early today because  the drainage had soaked through his dressings. 12/19/16- patient arrives for follow-up evaluation of his left lower extremity ulcers. He states that he is using his lymphedema pumps once daily when there is "no drainage". He admits to not using his lipedema pumps while under current treatment. His blood sugars have been consistently between 150-200. 12/26/16; the patient is not using his compression pumps at home because of the wetness on his feet. I've advised him that I think it's important for him to use this daily. He finds his feet too wet, he can put a plastic bag over his legs while he is in the pumps. Otherwise I think will be in a vicious circle. We are using silver alginate to the major area on his left posterior calf 01/02/17; the patient's posterior left leg has further of all into 3 open wounds. All of them covered with a necrotic surface. He claims to be using his compression pumps once a day. His edema control is marginal. Continue with silver alginate 01/10/17; the patient's left posterior leg actually looks somewhat better. There is less edema, less erythema. Still has 3 open areas covered with a necrotic surface requiring debridement. He claims to be using his compression pumps once a day his edema control is better 01/17/17; the patient's left posterior calf look better last week when I saw him and his wrap was changed 2 days ago. He has noted increasing pain in the left heel and arrives today with much larger wounds extensive erythema extending down into the entire heel area especially tender medially. He is not systemically unwell CBGs have been controlled no fever. Our intake nurse showed me limegreen drainage on his AVD pads. 01/24/17; his usual this patient responds nicely to antibiotics last week giving him Levaquin for presumed Pseudomonas. The whole entire posterior part of his leg is much better much less inflamed and in the case of his Achilles heel area much less tender. He  has also had some epithelialization posteriorly there are still open areas here and still draining but overall considerably better 01/31/17- He has continue to tolerate the compression wraps. he states that he continues to use  the lymphedema pumps daily, and can increase to twice daily on the weekends. He is voicing no complaints or concerns regarding his LLE ulcers 02/07/17-he is here for follow-up evaluation. He states that he noted some erythema to the left medial and anterior thigh, which he states is new as of yesterday. He is concerned about recurrent cellulitis. He states his blood sugars have been slightly elevated, this morning in the 180s 02/14/17; he is here for follow-up evaluation. When he was last here there was erythema superiorly from his posterior wound in his anterior thigh. He was prescribed Levaquin however a culture of the wound surface grew MRSA over the phone I changed him to doxycycline on Monday and things seem to be a lot better. 02/24/17; patient missed his appointment on Friday therefore we changed his nurse visit into a physician visit today. Still using silver alginate on the large area of the posterior left thigh. He isn't new area on the dorsal left second toe 03/03/17; actually better today although he admits he has not used his external compression pumps in the last 2 days or so because of work responsibilities over the weekend. 03/10/17; continued improvement. External compression pumps once a day almost all of his wounds have closed on the posterior left calf. Better edema control 03/17/17; in general improved. He still has 3 small open areas on the lateral aspect of his left leg however most of the area on the posterior part of his leg is epithelialized. He has better edema control. He has an ABD pad under his stocking on the right anterior lower leg although he did not let us look at that today. 03/24/17; patient arrives back in clinic today with no open areas however  there are areas on the posterior left calf and anterior left calf that are less than 100% epithelialized. His edema is well controlled in the left lower leg. There is some pitting edema probably lymphedema in the left upper thigh. He uses compression pumps at home once per day. I tried to get him to do this twice a day although he is very reticent. 04/01/2017 -- for the last 2 days he's had significant redness, tenderness and weeping and came in for an urgent visit today. 04/07/17; patient still has 6 more days of doxycycline. He was seen by Dr. Meyer Russel last Wednesday for cellulitis involving the posterior aspect, lateral aspect of his Involving his heel. For the most part he is better there is less erythema and less weeping. He has been on his feet for 12 hours o2 over the weekend. Using his compression pumps once a day 04/14/17 arrives today with continued improvement. Only one area on the posterior left calf that is not fully epithelialized. He has intense bilateral venous inflammation associated with his chronic venous insufficiency disease and secondary lymphedema. We have been using silver alginate to the left posterior calf wound In passing he tells [place] today that the right leg but we have not seen in quite some time has an open area on it but he doesn't want [place] to look at this today states he will show this to [place] next week. 04/21/17; there is no open area on his left leg although he still reports some weeping edema. He showed [place] his right leg today which is the first time we've seen this leg in a long time. He has a large area of open wound on the right leg anteriorly healthy granulation. Quite a bit of swelling in the right leg and  some degree of venous inflammation. He told us about the right leg in passing last week but states that deterioration in the right leg really only happened over the weekend 04/28/17; there is no open area on the left leg although there is an irritated part on the  posterior which is like a wrap injury. The wound on the right leg which was new from last week at least to [place] is a lot better. 05/05/17; still no open area on the left leg. Patient is using his new compression stocking which seems to be doing a good job of controlling the edema. He states he is using his compression pumps once per day. The right leg still has an open wound although it is better in terms of surface area. Required debridement. A lot of pain in the posterior right Achilles marked tenderness. Usually this type of presentation this patient gives concern for an active cellulitis 05/12/17; patient arrives today with his major wound from last week on the right lateral leg somewhat better. Still requiring debridement. He was using his compression stocking on the left leg however that is reopened with superficial wounds anteriorly he did not have an open wound on this leg previously. He is still using his juxta light's once daily at night. He cannot find the time to do this in the morning as he has to be at work by 7 AM 05/19/17; right lateral leg wound looks improved. No debridement required. The concerning area is on the left posterior leg which appears to almost have a subcutaneous hemorrhagic component to it. We've been using silver alginate to all the wounds 05/26/17; the right lateral leg wound continues to look improved. However the area on the left posterior calf is a tightly adherent surface. [place] using silver alginate. Because of the weeping edema in his legs there is very little good alternatives. 06/02/17; the patient left here last week looking quite good. Major wound on the left posterior calf and a small one on the right lateral calf. Both of these look satisfactory. He tells me that by Wednesday he had noted increased pain in the left leg and drainage. He called on Thursday and Friday to get an appointment here but we were blocked. He did not go to urgent care or his primary  physician. He thinks he had a fever on Thursday but did not actually take his temperature. He has not been using his compression pumps on the left leg because of pain. I advised him to go to the emergency room today for IV antibiotics for stents of left leg cellulitis but he has refused I have asked him to take 2 days off work to keep his leg elevated and he has refused this as well. In view of this I'm going to call him and Augmentin and doxycycline. 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 However after a detailed discussion regarding the risk benefits and alternatives he refuses to get admitted to the hospital. With no other recourse I will continue him on oral antibiotics as before and hopefully he'll have his infectious disease consultation this week. 06/16/2017 -- the patient was seen today by the nurse practitioner at infectious disease Ms. Dixon. Her review noted recurrent cellulitis of the lower extremity  with tinea pedis of the left foot and she has recommended clindamycin 150 mg daily for now and she may increase it to 300 mg daily to cover staph and Streptococcus. He has also been advise Lotrimin cream locally. she also had wise IV antibiotics for his condition if it flares up 06/23/17; patient arrives today with drainage bilaterally although the remaining wound on the left posterior calf after cleaning up today "highlighter yellow drainage" did not look too bad. Unfortunately he has had breakdown on the right anterior leg [previously this leg had not been open and he is using a black stocking] he went to see infectious disease and is been put on clindamycin 150 mg daily, I did not verify the dose although I'm not familiar with using clindamycin  in this dosing range, perhaps for prophylaxisoo 06/27/17; I brought this patient back today to follow-up on the wound deterioration on the right lower leg together with surrounding cellulitis. I started him on doxycycline 4 days ago. This area looks better however he comes in today with intense cellulitis on the medial part of his left thigh. This is not have a wound in this area. Extremely tender. We've been using silver alginate to the wounds on the right lower leg left lower leg with bilateral 4 layer compression he is using his external compression pumps once a day 07/04/17; patient's left medial thigh cellulitis looks better. He has not been using his compression pumps as his insert said it was contraindicated with cellulitis. His right leg continues to make improvements all the wounds are still open. We only have one remaining wound on the left posterior calf. Using silver alginate to all open areas. He is on doxycycline which I started a week ago and should be finishing I gave him Augmentin after Thursday's visit for the severe cellulitis on the left medial thigh which fortunately looks better 07/14/17; the patient's left medial thigh cellulitis has resolved. The cellulitis in his right lower calf on the right also looks better. All of his wounds are stable to improved we've been using silver alginate he has completed the antibiotics I have given him. He has clindamycin 150 mg once a day prescribed by infectious disease for prophylaxis, I've advised him to start this now. We have been using bilateral Unna boots over silver alginate to the wound areas 07/21/17; the patient is been to see infectious disease who noted his recurrent problems with cellulitis. He was not able to tolerate prophylactic clindamycin therefore he is on amoxicillin 500 twice a day. He also had a second daily dose of Lasix added By Dr. Oneta Rack but he is not taking this. Nor is he being completely compliant with  his compression pumps a especially not this week. He has 2 remaining wounds one on the right posterior lateral lower leg and one on the left posterior medial lower leg. 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 The patient has Unna boots bilaterally. 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 He has bilateral Unna boots for compression and silver alginate to his wounds. Arrives today with his legs looking as good as I have seen him in quite some time. Not surprisingly his wounds look better as well with improvement on the right lateral leg venous insufficiency wound and also the left  medial leg. He is still using the compression pumps once a day 08/11/17; both legs appear to be doing better wounds on the right lateral and left medial legs look better. Skin on the right leg quite good. He is been using silver alginate as the primary dressing. I'm going to use Anasept gel calcium alginate and maintain all the secondary dressings 08/18/17; the patient continues to actually do quite well. The area on his right lateral leg is just about closed the left medial also looks better although it is still moist in this area. His edema is well controlled we have been using Anasept gel with calcium alginate and the usual secondary dressings, 4 layer compression and once daily use of his compression pumps "always been able to manage 09/01/17; the patient continues to do reasonably well in spite of his trip to [place]. The area on the right lateral leg is epithelialized. Left is much better but still open. He has more edema and more chronic erythema on the left leg [venous inflammation] 09/08/17; he arrives today with no open wound on the right lateral leg and decently controlled edema.  Unfortunately his left leg is not nearly as in his good situation as last week.he apparently had increasing edema starting on Saturday. He edema soaked through into his foot so used a plastic bag to walk around his home. The area on the medial right leg which was his open area is about the same however he has lost surface epithelium on the left lateral which is new and he has significant pain in the Achilles area of the left foot. He is already on amoxicillin chronically for prophylaxis of cellulitis in the left leg 09/15/17; he is completed a week of doxycycline and the cellulitis in the left posterior leg and Achilles area is as usual improved. He still has a lot of edema and fluid soaking through his dressings. There is no open wound on the right leg. He saw infectious disease NP today 09/22/17;As usual 1 we transition him from our compression wraps to his stockings things did not go well. He has several small open areas on the right leg. He states this was caused by the compression wrap on his skin although he did not wear this with the stockings over them. He has several superficial areas on the left leg medially laterally posteriorly. He does not have any evidence of active cellulitis especially involving the left Achilles The patient is traveling from [facility] Saturday going to [facility]. He states he isn't attempting to get an appointment with a heel objects wound center there to change his dressings. I am not completely certain whether this will work 10/06/17; the patient came in on Friday for a nurse visit and the nurse reported that his legs actually look quite good. He arrives in clinic today for his regular follow-up visit. He has a new wound on his left third toe over the PIP probably caused by friction with his footwear. He has small areas on the left leg and a very superficial but epithelialized area on the right anterior lateral lower leg. Other than that his legs look as good  as I've seen him in quite some time. We have been using silver alginate Review of systems; no chest pain no shortness of breath other than this a 10 point review of systems negative 10/20/17; seen by Dr. Meyer Russel last week. He had taken some antibiotics [doxycycline] that he had left over. Dr. Meyer Russel thought he had candida infection and  declined to give him further antibiotics. He has a small wound remaining on the right lateral leg several areas on the left leg including a larger area on the left posterior several left medial and anterior and a small wound on the left lateral. The area on the left dorsal third toe looks a lot better. ROS; Gen.; no fever, respiratory no cough no sputum Cardiac no chest pain other than this 10 point review of system is negative 10/30/17; patient arrives today having fallen in the bathtub 3 days ago. It took him a while to get up. He has pain and maceration in the wounds on his left leg which have deteriorated. He has not been using his pumps he also has some maceration on the right lateral leg. 11/03/17; patient continues to have weeping edema especially in the left leg. This saturates his dressings which were just put on on 12/27. As usual the doxycycline seems to take care of the cellulitis on his lower leg. He is not complaining of fever, chills, or other systemic symptoms. He states his leg feels a lot better on the doxycycline I gave him empirically. He also apparently gets injections at his primary doctor's officeo Rocephin for cellulitis prophylaxis. I didn't ask him about his compression pump compliance today I think that's probably marginal. Arrives in the clinic with all of his dressings primary and secondary macerated full of fluid and he has bilateral edema 11/10/17; the patient's right leg looks some better although there is still a cluster of wounds on the right lateral. The left leg is inflamed with almost circumferential skin loss medially to laterally  although we are still maintaining anteriorly. He does not have overt cellulitis there is a lot of drainage. He is not using compression pumps. We have been using silver alginate to the wound areas, there are not a lot of options here 11/17/17; the patient's right leg continues to be stable although there is still open wounds, better than last week. The inflammation in the left leg is better. Still loss of surface layer epithelium especially posteriorly. There is no overt cellulitis in the amount of edema and his left leg is really quite good, tells me he is using his compression pumps once a day. 11/24/17; patient's right leg has a small superficial wound laterally this continues to improve. The inflammation in the left leg is still improving however we have continuous surface layer epithelial loss posteriorly. There is no overt cellulitis in the amount of edema in both legs is really quite good. He states he is using his compression pumps on the left leg once a day for 5 out of 7 days 12/01/17; very small superficial areas on the right lateral leg continue to improve. Edema control in both legs is better today. He has continued loss of surface epithelialization and left posterior calf although I think this is better. We have been using silver alginate with large number of absorptive secondary dressings 4 layer on the left Unna boot on the right at his request. He tells me he is using his compression pumps once a day 12/08/17; he has no open area on the right leg is edema control is good here. ooOn the left leg however he has marked erythema and tenderness breakdown of skin. He has what appears to be a wrap injury just distal to the popliteal fossa. This is the pattern of his recurrent cellulitis area and he apparently received penicillin at his primary physician's office really worked in my view  but usually response to doxycycline given it to him several times in the past 12/15/17; the patient had  already deteriorated last Friday when he came in for his nurse check. There was swelling erythema and breakdown in the right leg. He has much worse skin breakdown in the left leg as well multiple open areas medially and posteriorly as well as laterally. He tells me he has been using his compression pumps but tells me he feels that the drainage out of his leg is worse when he uses a compression pumps. To be fair to him he is been saying this for a while however I don't know that I have really been listening to this. I wonder if the compression pumps are working properly 12/22/17;. Once again he arrives with severe erythema, weeping edema from the left greater than right leg. Noncompliance with compression pumps. New this visit he is complaining of pain on the lateral aspect of the right leg and the medial aspect of his right thigh. He apparently saw his cardiologist Dr. [name] who was ordered an echocardiogram area and I think this is a step in the right direction 12/25/17; started his doxycycline Monday night. There is still intense erythema of the right leg especially in the anterior thigh although there is less tenderness. The erythema around the wound on the right lateral calf also is less tender. He still complaining of pain in the left heel. His wounds are about the same right lateral left medial left lateral. Superficial but certainly not close to closure. He denies being systemically unwell no fever chills no abdominal pain no diarrhea 12/29/17; back in follow-up of his extensive right calf and right thigh cellulitis. I added amoxicillin to cover possible doxycycline resistant strep. This seems to of done the trick he is in much less pain there is much less erythema and swelling. He has his echocardiogram at 11:00 this morning. X-ray of the left heel was also negative. 01/05/18; the patient arrived with his edema under much better control. Now that he is retired he is able to use his compression  pumps daily and sometimes twice a day per the patient. He has a wound on the right leg the lateral wound looks better. Area on the left leg also looks a lot better. He has no evidence of cellulitis in his bilateral thighs I had a quick peak at his echocardiogram. He is in normal ejection fraction and normal left ventricular function. He has moderate pulmonary hypertension moderately reduced right ventricular function. One would have to wonder about chronic sleep apnea although he says he doesn't snore. He'll review the echocardiogram with his cardiologist. 01/12/18; the patient arrives with the edema in both legs under exemplary control. He is using his compression pumps daily and sometimes twice daily. His wound on the right lateral leg is just about closed. He still has some weeping areas on the posterior left calf and lateral left calf although everything is just about closed here as well. I have spoken with Aldean Baker who is the patient's nurse practitioner and infectious disease. She was concerned that the patient had not understood that the parenteral penicillin injections he was receiving for cellulitis prophylaxis was actually benefiting him. I don't think the patient actually saw that I would tend to agree we were certainly dealing with less infections although he had a serious one last month. 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 He is healed. He'll be placed in juxtalite compression wraps  and increase his lymphedema pumps to twice daily. We will follow up again next week to ensure there are no issues with the new regiment. 01/20/18-he is here for evaluation of bilateral lower extremity weeping edema. Yesterday he was placed in compression wrap to the right lower extremity and compression stocking to left lower shrubbery. He states he uses lymphedema pumps last night and again this morning and noted a blister to the left lower extremity. On exam  he was noted to have drainage to the right lower extremity. He will be placed in Unna boots bilaterally and follow-up next week 01/26/18; patient was actually discharged a week ago to his own juxta light stockings only to return the next day with bilateral lower extremity weeping edema.he was placed in bilateral Unna boots. He arrives today with pain in the back of his left leg. There is no open area on the right leg however there is a linear/wrap injury on the left leg and weeping edema on the left leg posteriorly. I spoke with infectious disease about 10 days ago. They were disappointed that the patient elected to discontinue prophylactic intramuscular penicillin shots as they felt it was particularly beneficial in reducing the frequency of his cellulitis. I discussed this with the patient today. He does not share this view. He'll definitely need antibiotics today. Finally he is traveling to [place] and trauma leaving this Saturday and returning a week later and he does not travel with his pumps. He is going by car 01/30/18; patient was seen 4 days ago and brought back in today for review of cellulitis in the left leg posteriorly. I put him on amoxicillin this really hasn't helped as much as I might like. He is also worried because he is traveling to [facility] trauma by car. Finally we will be rewrapping him. There is no open area on the right leg over his left leg has multiple weeping areas as usual 02/09/18; The same wrap on for 10 days. He did not pick up the last doxycycline I prescribed for him. He apparently took 4 days worth he already had. There is nothing open on his right leg and the edema control is really quite good. He's had damage in the left leg medially and laterally especially probably related to the prolonged use of Unna boots 02/12/18; the patient arrived in clinic today for a nurse visit/wrap change. He complained of a lot of pain in the left posterior calf. He is taking  doxycycline that I previously prescribed for him. Unfortunately even though he used his stockings and apparently used to compression pumps twice a day he has weeping edema coming out of the lateral part of his right leg. This is coming from the lower anterior lateral skin area. 02/16/18; the patient has finished his doxycycline and will finish the amoxicillin 2 days. The area of cellulitis in the left calf posteriorly has resolved. He is no longer having any pain. He tells me he is using his compression pumps at least once a day sometimes twice. 02/23/18; the patient finished his doxycycline and Amoxil last week. On Friday he noticed a small erythematous circle about the size of a quarter on the left lower leg just above his ankle. This rapidly expanded and he now has erythema on the lateral and posterior part of the thigh. This is bright red. Also has an area on the dorsal foot just above his toes and a tender area just below the left popliteal fossa. He came off his prophylactic penicillin  injections at his own insistence one or 2 months ago. This is obviously deteriorated since then 03/02/18; patient is on doxycycline and Amoxil. Culture I did last week of the weeping area on the back of his left calf grew group B strep. I have therefore renewed the amoxicillin 500 3 times a day for a further week. He has not been systemically unwell. Still complaining of an area of discomfort right under his left popliteal fossa. There is no open wound on the right leg. He tells me that he is using his pumps twice a day on most days 03/09/18; patient arrives in clinic today completing his amoxicillin today. The cellulitis on his left leg is better. Furthermore he tells me that he had intramuscular penicillin shots that his primary care office today. However he also states that the wrap on his right leg fell down shortly after leaving clinic last week. He developed a large blister that was present when he came in for  a nurse visit later in the week and then he developed intense discomfort around this area.He tells me he is using his compression pumps 03/16/18; the patient has completed his doxycycline. The infectious part of this/cellulitis in the left heel area left popliteal area is a lot better. He has 2 open areas on the right calf. Still areas on the left calf but this is a lot better as well. 03/24/18; the patient arrives complaining of pain in the left popliteal area again. He thinks some of this is wrap injury. He has no open area on the right leg and really no open area on the left calf either except for the popliteal area. He claims to be compliant with the compression pumps 03/31/18; I gave him doxycycline last week because of cellulitis in the left popliteal area. This is a lot better although the surface epithelium is denuded off and response to this. He arrives today with uncontrolled edema in the right calf area as well as a fingernail injury in the right lateral calf. There is only a few open areas on the left 04/06/18; I gave him amoxicillin doxycycline over the last 2 weeks that the amoxicillin should be completing currently. He is not complaining of any pain or systemic symptoms. The only open areas see has is on the right lateral lower leg paradoxically I cannot see anything on the left lower leg. He tells me he is using his compression pumps twice a day on most days. Silver alginate to the wounds that are open under 4 layer compression 04/13/18; he completed antibiotics and has no new complaints. Using his compression pumps. Silver alginate that anything that's opened 04/20/18; he is using his compression pumps religiously. Silver alginate 4 layer compression anything that's opened. He comes in today with no open wounds on the left leg but 3 on the right including a new one posteriorly. He has 2 on the right lateral and one on the right posterior. He likes Unna boots on the right leg for reasons  that aren't really clear we had the usual 4 layer compression on the left. It may be necessary to move to the 4 layer compression on the right however for now I left them in the Unna boots 04/27/18; he is using his compression pumps at least once a day. He has still the wounds on the right lateral calf. The area right posteriorly has closed. He does not have an open wound on the left under 4 layer compression however on the dorsal left  foot just proximal to the toes and the left third toe 2 small open areas were identified 05/11/18; he has not uses compression pumps. The areas on the right lateral calf have coalesced into one large wound necrotic surface. On the left side he has one small wound anteriorly however the edema is now weeping out of a large part of his left leg. He says he wasn't using his pumps because of the weeping fluid. I explained to him that this is the time he needs to pump more 05/18/18; patient states he is using his compression pumps twice a day. The area on the right lateral large wound albeit superficial. On the left side he has innumerable number of small new wounds on the left calf particularly laterally but several anteriorly and medially. All these appear to have healthy granulated base these look like the remnants of blisters however they occurred under compression. The patient arrives in clinic today with his legs somewhat better. There is certainly less edema, less multiple open areas on the left calf and the right anterior leg looks somewhat better as well superficial and a little smaller. However he relates pain and erythema over the last 3-4 days in the thigh and I looked at this today. He has not been systemically unwell no fever no chills no change in blood sugar values 05/25/18; comes in today in a better state. The severe cellulitis on his left leg seems better with the Keflex. Not as tender. He has not been systemically unwell ooHard to find an open wound on  the left lower leg using his compression pumps twice a day ooThe confluent wounds on his right lateral calf somewhat better looking. These will ultimately need debridement I didn't do this today. 06/01/18; the severe cellulitis on the left anterior thigh has resolved and he is completed his Keflex. ooThere is no open wound on the left leg however there is a superficial excoriation at the base of the third toe dorsally. Skin on the bottom of his left foot is macerated looking. ooThe left the wounds on the lateral right leg actually looks some better although he did require debridement of the top half of this wound area with an open curet 06/09/18 on evaluation today patient appears to be doing poorly in regard to his right lower extremity in particular this appears to likely be infected he has very thick purulent discharge along with a bright green tent to the discharge. This makes me concerned about the possibility of pseudomonas. He's also having increased discomfort at this point on evaluation. Fortunately there does not appear to be any evidence of infection spreading to the other location at this time. 06/16/18 on evaluation today patient appears to actually be doing fairly well. His ulcer has actually diminished in size quite significantly at this point which is good news. Nonetheless he still does have some evidence of infection he did see infectious disease this morning before coming here for his appointment. I did review the results of their evaluation and their note today. They did actually have him discontinue the Cipro and initiate treatment with linezolid at this time. He is doing this for the next seven days and they recommended a follow-up in four months with them. He is the keep a log of the need for intermittent antibiotic therapy between now and when he falls back up with infectious disease. This will help them gaze what exactly they need to do to try and help them out. 06/23/18;  the patient  arrives today with no open wounds on the left leg and left third toe healed. He is been using his compression pumps twice a day. On the right lateral leg he still has a sizable wound but this is a lot better than last time I saw this. 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 Has been using silver alginate under 4 layer compression 06/30/18; the only open wound he has is on the right lateral leg and this looks healthy. No debridement is required. We have been using silver alginate. He does not have an open wound on the left leg. There is apparently some drainage from the dorsal proximal third toe on the left although I see no open wound here. 07/03/18 on evaluation today patient was actually here just for a nurse visit rapid change. However when he was here on Wednesday for his rat change due to having been healed on the left and then developing blisters we initiated the wrap again knowing that he would be back today for [place] to reevaluate and see were at. Unfortunately he has developed some cellulitis into the proximal portion of his right lower extremity even into the region of his thigh. 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 He was placed on one as what at that point. Nonetheless he is done with that and has been tolerating it well otherwise. Doxycycline which in the past really did not seem to be effective for him. Nonetheless I think the best option may be for [place] to definitely reinitiate the antibiotics for a longer period of time. 07/07/18; since I last saw this patient a week ago he has had a difficult time. At that point he did not have an open wound on his left leg. We transitioned him into juxta light stockings. He was apparently in the clinic the next day with blisters on the left lateral and left medial lower calf. He also had  weeping edema fluid. He was put back into a compression wrap. He was also in the clinic on Friday with intense erythema in his right thigh. Per the patient he was started on Bactrim however that didn't work at all in terms of relieving his pain and swelling. He has taken 3 doxycycline that he had left over from last time and that seems to of helped. He has blistering on the right thigh as well. 07/14/18; the erythema on his right thigh has gotten better with doxycycline that he is finishing. 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 Presumably this cellulitis in the thigh was not related to that although I think this is a bit concerning going forward. He still has an area on the right lateral calf the blister on the right medial calf just below the knee that was discussed above. On the left 2 small open areas left medial and left lateral. Edema control is adequate. He is using his compression pumps twice a day 07/20/18; continued improvement in the condition of both legs especially the edema in his bilateral thighs. He tells me he is been losing weight through a combination of diet and exercise. He is using his compression pumps twice a day. So overall she made to the remaining wounds 07/27/2018; continued improvement in condition of both legs. His edema is  well controlled. The area on the right lateral leg is just about closed he had one blisters show up on the medial left upper calf. We have him in 4 layer compression. He is going on a 10-day trip to [place], [place] and [place]. He will be driving. He wants to wear Unna boots because of the lessening amount of constriction. He will not use compression pumps while he is away 08/05/18 on evaluation today patient actually appears to be doing decently well all things considered in regard to his bilateral lower extremities. The worst ulcer is actually only posterior  aspect of his left lower extremity with a four layer compression wrap cut into his leg a couple weeks back. He did have a trip and actually had [employer] for the trip that he is worn since he was last here. Nonetheless he feels like the [employer] actually do better for him his swelling is up a little bit but he also with his trip was not taking his Lasix on a regular set schedule like he was supposed to be. He states that obviously the reason being that he cannot drive and keep going without having to urinate too frequently which makes it difficult. He did not have his pumps with him while he was away either which I think also maybe playing a role here too. 08/13/2018; the patient only has a small open wound on the right lateral calf which is a big improvement in the last month or 2. He also has the area posteriorly just below the posterior fossa on the left which I think was a wrap injury from several weeks ago. He has no current evidence of cellulitis. He tells me he is back into his compression pumps twice a day. He also tells me that while he was at the laundromat somebody stole a section of his extremitease stockings 08/20/2018; back in the clinic with a much improved state. He only has small areas on the right lateral mid calf which is just about healed. This was is more substantial area for quite a prolonged period of time. He has a small open area on the left anterior tibia. The area on the posterior calf just below the popliteal fossa is closed today. He is using his compression pumps twice a day 08/28/2018; patient has no open wound on the right leg. He has a smattering of open areas on the calf with some weeping lymphedema. More problematically than that it looks as though his wraps of slipped down in his usual he has very angry upper area of edema just below the right medial knee and on the right lateral calf. He has no open area on his feet. The patient is traveling to  [facility] next week. I will send him in an antibiotic. We will continue to wrap the right leg. We ordered extremitease stockings for him last week and I plan to transition the right leg to a stocking when he gets home which will be in 10 days time. As usual he is very reluctant to take his pumps with him when he travels 09/07/2018; patient returns from [facility]. He shows me a picture of his left leg in the mid part of his trip last week with intense fire engine erythema. The picture look bad enough I would have considered sending him to the hospital. Instead he went to the wound care center in [facility]. They did not prescribe him antibiotics but he did take some  doxycycline he had leftover from a previous visit. I had given him trimethoprim sulfamethoxazole before he left this did not work according to the patient. This is resulted in some improvement fortunately. He comes back with a large wound on the left posterior calf. Smaller area on the left anterior tibia. Denuded blisters on the dorsal left foot over his toes. Does not have much in the way of wounds on the right leg although he does have a very tender area on the right posterior area just below the popliteal fossa also suggestive of infection. He promises me he is back on his pumps twice a day 09/15/2018; the intense cellulitis in his left lower calf is a lot better. The wound area on the posterior left calf is also so better. However he has reasonably extensive wounds on the dorsal aspect of his second and third toes and the proximal foot just at the base of the toes. There is nothing open on the right leg 09/22/2018; the patient has excellent edema control in his legs bilaterally. He is using his external compression pumps twice a day. He has no open area on the right leg and only the areas in the left foot dorsally second and third toe area on the left side. He does not have any signs of active cellulitis. 10/06/2018;  the patient has good edema control bilaterally. He has no open wound on the right leg. There is a blister in the posterior aspect of his left calf that we had to deal with today. He is using his compression pumps twice a day. There is no signs of active cellulitis. We have been using silver alginate to the wound areas. He still has vulnerable areas on the base of his left first second toes dorsally He has a his extremities stockings and we are going to transition him today into the stocking on the right leg. He is cautioned that he will need to continue to use the compression pumps twice a day. If he notices uncontrolled edema in the right leg he may need to go to 3 times a day. 10/13/2018; the patient came in for a nurse check on Friday he has a large flaccid blister on the right medial calf just below the knee. We unroofed this. He has this and a new area underneath the posterior mid calf which was undoubtedly a blister as well. He also has several small areas on the right which is the area we put his extremities stocking on. 10/19/2018; the patient went to see infectious disease this morning I am not sure if that was a routine follow-up in any case the doxycycline I had given him was discontinued and started on linezolid. He has not started this. It is easy to look at his left calf and the inflammation and think this is cellulitis however he is very tender in the tissue just below the popliteal fossa and I have no doubt that there is infection going on here. He states the problem he is having is that with the compression pumps the edema goes down and then starts walking the wrap falls down. We will see if we can adhere this. He has 1 or 2 minuscule open areas on the right still areas that are weeping on the posterior left calf, the base of his left second and third toes 10/26/18; back today in clinic with quite of skin breakdown in his left anterior leg. This may have been infection the area  below the popliteal fossa seems  a lot better however tremendous epithelial loss on the left anterior mid tibia area over quite inexpensive tissue. He has 2 blisters on the right side but no other open wound here. 10/29/2018; came in urgently to see [place] today and we worked him in for review. He states that the 4 layer compression on the right leg caused pain he had to cut it down to roughly his mid calf this caused swelling above the wrap and he has blisters and skin breakdown today. As a result of the pain he has not been using his pumps. Both legs are a lot more edematous and there is a lot of weeping fluid. 11/02/18; arrives in clinic with continued difficulties in the right leg> left. Leg is swollen and painful. multiple skin blisters and new open areas especially laterally. He has not been using his pumps on the right leg. 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 He is not systemically unwell. 11/09/2018; the patient claims he is being compliant with his pumps. He is finished the doxycycline I gave him last week. Culture I did of the wound on the right lateral leg showed a few very resistant methicillin staph aureus. This was resistant to doxycycline. Nevertheless he states the pain in the leg is a lot better which makes me wonder if the cultured organism was not really what was causing the problem nevertheless this is a very dangerous organism to be culturing out of any wound. His right leg is still a lot larger than the left. He is using an Radio broadcast assistant on this area, he blames a 4-layer compression for causing the original skin breakdown which I doubt is true however I cannot talk him out of it. We have been using silver alginate to all of these areas which were initially blisters 11/16/2018; patient is being compliant with his external compression pumps at twice a day. Miraculously he arrives in clinic today with absolutely no open wounds. He has  better edema control on the left where he has been using 4 layer compression versus wound of wounds on the right and I pointed this out to him. There is no inflammation in the skin in his lower legs which is also somewhat unusual for him. There is no open wounds on the dorsal left foot. He has extremitease stockings at home and I have asked him to bring these in next week. 11/25/18 patient's lower extremity on examination today on the left appears for the most part to be wound free. He does have an open wound on the lateral aspect of the right lower extremity but this is minimal compared to what I've seen in past. He does request that we go ahead and wrap the left leg as well even though there's nothing open just so hopefully it will not reopen in short order. 1/28; patient has superficial open wounds on the right lateral calf left anterior calf and left posterior calf. His edema control is adequate. 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 We have been using silver alginate as the primary dressing. He claims compliance with his compression pumps 2/4; patient has superficial open wounds on numerous areas of his left calf and again one on the left dorsal foot. The areas on the right lateral calf have healed. The cellulitis that I gave him doxycycline for last week is also resolved this was mostly on the  left anterior calf just below the tibial tuberosity. His edema looks fairly well-controlled. He tells me he went to see his primary doctor today and had blood work ordered 2/11; once again he has several open areas on the left calf left tibial area. Most of these are small and appear to have healthy granulation. He does not have anything open on the right. The edema and control in his thighs is pretty good which is usually a good indication he has been using his pumps as requested. 2/18; he continues to have  several small areas on the left calf and left tibial area. Most of these are small healthy granulation. We put him in his stocking on the right leg last week and he arrives with a superficial open area over the right upper tibia and a fairly large area on the right lateral tibia in similar condition. His edema control actually does not look too bad, he claims to be using his compression pumps twice a day 2/25. Continued small areas on the left calf and left tibial area. New areas especially on the right are identified just below the tibial tuberosity and on the right upper tibia itself. There are also areas of weeping edema fluid even without an obvious wound. He does not have a considerable degree of lymphedema but clearly there is more edema here than his skin can handle. He states he is using the pumps twice a day. We have an Unna boot on the right and 4 layer compression on the left. 3/3; he continues to have an area on the right lateral calf and right posterior calf just below the popliteal fossa. There is a fair amount of tenderness around the wound on the popliteal fossa but I did not see any evidence of cellulitis, could just be that the wrap came down and rubbed in this area. ooHe does not have an open area on the left leg however there is an area on the left dorsal foot at the base of the third toe ooWe have been using silver alginate to all wound areas 3/10; he did not have an open area on his left leg last time he was here a week ago. Today he arrives with a horizontal wound just below the tibial tuberosity and an area on the left lateral calf. He has intense erythema and tenderness in this area. The area is on the right lateral calf and right posterior calf better than last week. We have been using silver alginate as usual 3/18 - Patient returns with 3 small open areas on left calf, and 1 small open area on right calf, the skin looks ok with no significant erythema, he continues  the UNA boot on right and 4 layer compression on left. The right lateral calf wound is closed , the right posterior is small area. we will continue silver alginate to the areas. 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 He is been tolerating the dressing changes without complication. Fortunately she has made excellent improvement in regard to the overall status of his wounds. Unfortunately every time we cease wrapping him he ends up reopening in causing more significant issues at that point. Again I'm unsure of the best direction to take although I think the lymphedema clinic may be appropriate for him. 02/03/19 on evaluation today patient  appears to be doing well in regard to the wounds that we saw him for last week unfortunately he has a new area on the proximal portion of his right medial/posterior lower extremity where the wrap somewhat slowed down and caused swelling and a blister to rub and open. Unfortunately this is the only opening that he has on either leg at this point. 02/17/19 on evaluation today patient's bilateral lower extremities appear to be doing well. He still completely healed in regard to the left lower extremity. In regard to the right lower extremity the area where the wrap and slid down and caused the blister still seems to be slightly open although this is dramatically better than during the last evaluation two weeks ago. I'm very pleased with the way this stands overall. 03/03/19 on evaluation today patient appears to be doing well in regard to his right lower extremity in general although he did have a new blister open this does not appear to be showing any evidence of active infection at this time. Fortunately there's No fevers, chills, nausea, or vomiting noted at this time.  Overall I feel like he is making good progress it does feel like that the right leg will we perform the [employer] seems to do with a bit better than three layer wrap on the left which slid down on him. We may switch to doing bilateral in the book wraps. 5/4; I have not seen Mr. Veldman in quite some time. According to our case manager he did not have an open wound on his left leg last week. He had 1 remaining wound on the right posterior medial calf. He arrives today with multiple openings on the left leg probably were blisters and/or wrap injuries from Unna boots. I do not think the Unna boot's will provide adequate compression on the left. I am also not clear about the frequency he is using the compression pumps. 03/17/19 on evaluation today patient appears to be doing excellent in regard to his lower extremities compared to last week's evaluation apparently. He had gotten significantly worse last week which is unfortunate. The [employer] wrap on the left did not seem to do very well for him at all and in fact it didn't control his swelling significantly enough he had an additional outbreak. Subsequently we go back to the four layer compression wrap on the left. This is good news. At least in that he is doing better and the wound seem to be killing him. He still has not heard anything from the lymphedema clinic. 03/24/19 on evaluation today patient actually appears to be doing much better in regard to his bilateral lower [place] as compared to last week when I saw him. Fortunately there's no signs of active infection at this time. He has been tolerating the dressing changes without complication. Overall I'm extremely pleased with the progress and appearance in general. 04/07/19 on evaluation today patient appears to be doing well in regard to his bilateral lower extremities. His swelling is significantly down from where it was previous. With that being said he does have a couple blisters still open  at this point but fortunately nothing that seems to be too severe and again the majority of the larger openings has healed at this time. 04/14/19 on evaluation today patient actually appears to be doing quite well in regard to his bilateral lower extremities in fact I'm not even sure there's anything significantly open at this time at any site. Nonetheless he did have some  trouble with these wraps where they are somewhat irritating him secondary to the fact that he has noted that the graph wasn't too close down to the end of this foot in a little bit short as well up to his knee. Otherwise things seem to be doing quite well. 04/21/19 upon evaluation today patient's wound bed actually showed evidence of being completely healed in regard to both lower extremities which is excellent news. There does not appear to be any signs of active infection which is also good news. I'm very pleased in this regard. No fevers, chills, nausea, or vomiting noted at this time. 04/28/19 on evaluation today patient appears to be doing a little bit worse in regard to both lower extremities on the left mainly due to the fact that when he went infection disease the wrap was not wrapped quite high enough he developed a blister above this. On the right he is a small open area of nothing too significant but again this is continuing to give him some trouble he has been were in the Velcro compression that he has at home. 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 He's been tolerating the dressing changes without complication. Fortunately there's no signs of active infection at this time. No fevers, chills, nausea, or vomiting noted at this time. We have been trying to get an appointment with her lymphedema clinic in [facility] but unfortunately nobody can get them on phone with not been able to even fax information over the patient likewise is not been  able to get in touch with them. Overall I'm not sure exactly what's going on here with to reach out again today. 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 Still having a lot of drainage unfortunately. He tells me especially in the left but even on the right. There's no signs of active infection which is good news we've been using so ratcheted up to this point. 05/19/19 on evaluation today patient actually appears to be doing quite well with regard to his left lower extremity which is great news. Fortunately in regard to the right lower extremity has an issues with his wrap and he subsequently did remove this from what I'm understanding. Nonetheless long story short is what he had rewrapped once he removed it subsequently had maggots underneath this wrap whenever he came in for evaluation today. With that being said they were obviously completely cleaned away by the nursing staff. The visit today which is excellent news. However he does appear to potentially have some infection around the right ankle region where the maggots were located as well. He will likely require anabiotic therapy today. 05/26/19 on evaluation today patient actually appears to be doing much better in regard to his bilateral lower extremities. I feel like the infection is under much better control. With that being said there were maggots noted when the wrap was removed yet again today. Again this could have potentially been left over from previous although at this time there does not appear to be any signs of significant drainage there was obviously on the wrap some drainage as well this contracted gnats or otherwise. Either way I do not see anything that appears to be doing worse in my pinion and in fact I think his drainage has slowed down quite significantly likely mainly due to the fact to his infection being  under better control. 06/02/2019  on evaluation today patient actually appears to be doing well with regard to his bilateral lower extremities there is no signs of active infection at this time which is great news. With that being said he does have several open areas more so on the right than the left but nonetheless these are all significantly better than previously noted. 06/09/2019 on evaluation today patient actually appears to be doing well. His wrap stayed up and he did not cause any problems he had more drainage on the right compared to the left but overall I do not see any major issues at this time which is great news. 06/16/2019 on evaluation today patient appears to be doing excellent with regard to his lower extremities the only area that is open is a new blister that can have opened as of today on the medial ankle on the left. Other than this he really seems to be doing great I see no major issues at this point. 06/23/2019 on evaluation today patient appears to be doing quite well with regard to his bilateral lower extremities. In fact he actually appears to be almost completely healed there is a small area of weeping noted of the right lower extremity just above the ankle. Nonetheless fortunately there is no signs of active infection at this time which is good news. No fevers, chills, nausea, vomiting, or diarrhea. 8/24; the patient arrived for a nurse visit today but complained of very significant pain in the left leg and therefore I was asked to look at this. Noted that he did not have an open area on the left leg last week nevertheless this was wrapped. The patient states that he is not been able to put his compression pumps on the left leg because of the discomfort. He has not been systemically unwell 06/30/2019 on evaluation today patient unfortunately despite being excellent last week is doing much worse with regard to his left lower extremity today. In fact he had to come in for a nurse on Monday where his left leg  had to be rewrapped due to excessive weeping Dr. Leanord Hawking placed him on doxycycline at that point. Fortunately there is no signs of active infection Systemically at this time which is good news. 07/07/2019 in regard to the patient's wounds today he actually seems to be doing well with his right lower extremity there really is nothing open or draining at this point this is great news. Unfortunately the left lower extremity is given him additional trouble at this time. There does not appear to be any signs of active infection nonetheless he does have a lot of edema and swelling noted at this point as well as blistering all of which has led to a much more poor appearing leg at this time compared to where it was 2 weeks ago when it was almost completely healed. Obviously this is a little discouraging for the patient. He is try to contact the lymphedema clinic in [place] he has not been able to get through to them. 07/14/2019 on evaluation today patient actually appears to be doing slightly better with regard to his left lower extremity ulcers. Overall I do feel like at least at the top of the wrap that we have been placing this area has healed quite nicely and looks much better. The remainder of the leg is showing signs of improvement. Unfortunately in the thigh area he still has an open region on the left and again on the right he has  been utilizing just a Band-Aid on an area that also opened on the thigh. Again this is an area that were not able to wrap although we did do an Ace wrap to provide some compression that something that obviously is a little less effective than the compression wraps we have been using on the lower portion of the leg. He does have an appointment with the lymphedema clinic in [facility] on Friday. 07/21/2019 on evaluation today patient appears to be doing better with regard to his lower extremity ulcers. He has been tolerating the dressing changes without complication.  Fortunately there is no signs of active infection at this time. No fevers, chills, nausea, vomiting, or diarrhea. I did receive the paperwork from the physical therapist at the lymphedema clinic in [place]. Subsequently I signed off on that this morning and sent that back to him for further progression with the treatment plan. 07/28/2019 on evaluation today patient appears to be doing very well with regard to his right lower extremity where I do not see any open wounds at this point. Fortunately he is feeling great as far as that is concerned as well. In regard to the left lower extremity he has been having issues with still several areas of weeping and edema although the upper leg is doing better his lower leg still I think is going require the compression wrap at this time. No fevers, chills, nausea, vomiting, or diarrhea. 08/04/2019 on evaluation today patient unfortunately is having new wounds on the right lower extremity. Again we have been using Unna boot wrap on that side. We switched him to using his juxta lite wrap at home. With that being said he tells me he has been using it although his legs extremely swollen and to be honest really does not appear that he has been. I cannot know that for sure however. Nonetheless he has multiple new wounds on the right lower extremity at this time. Obviously we will have to see about getting this rewrapped for him today. 08/11/2019 on evaluation today patient appears to be doing fairly well with regard to his wounds. He has been tolerating the dressing changes including the compression wraps without complication. He still has a lot of edema in his upper thigh regions bilaterally he is supposed to be seeing the lymphedema clinic on the 15th of this month once his wraps arrive for the upper part of his legs. 08/18/2019 on evaluation today patient appears to be doing well with regard to his bilateral lower extremities at this point. He has been  tolerating the dressing changes without complication. Fortunately there is no signs of active infection which is also good news. He does have a couple weeping areas on the first and second toe of the right foot he also has just a small area on the left foot upper leg and a small area on the left lower leg but overall he is doing quite well in my opinion. He is supposed to be getting his wraps shortly in fact tomorrow and then subsequently is seeing the lymphedema clinic next Wednesday on the 21st. Of note he is also leaving on the 25th to go on vacation [place] a [place] the beach. For that reason and since there is some uncertainty about what there can be doing at lymphedema clinic next Wednesday I am get a make an appointment for next Friday here for [place] to see what we need to do for him prior to him leaving for vacation. 10/23; patient arrives  in considerable pain predominantly in the upper posterior calf just distal to the popliteal fossa also in the wound anteriorly above the major wound. This is probably cellulitis and he has had this recurrently in the past. He has no open wound on the right side and he has had an Radio broadcast assistant in that area. Finally I note that he has an area on the left posterior calf which by enlarge is mostly epithelialized. This protrudes beyond the borders of the surrounding skin in the setting of dry scaly skin and lymphedema. The patient is leaving for [facility] on Sunday. Per his longstanding pattern, he will not take his compression pumps with him predominantly out of fear that they will be stolen. He therefore asked that we put a Unna boot back on the right leg. He will also contact the wound care center in [facility] to see if they can change his dressing in the mid week. 11/3; patient returned from his vacation to [facility]. He was seen on 1 occasion at their wound care center. They did a 2 layer compression system as they did not have our 4-layer wrap. I am not  completely certain what they put on the wounds. They did not change the Unna boot on the right. The patient is also seeing a lymphedema specialist physical therapist in [place]. It appears that he has some compression sleeve for his thighs which indeed look quite a bit better than I am used to seeing. He pumps over these with his external compression pumps. 11/10; the patient has a new wound on the right medial thigh otherwise there is no open areas on the right. He has an area on the left leg posteriorly anteriorly and medially and an area over the left second toe. We have been using silver alginate. He thinks the injury on his thigh is secondary to friction from the compression sleeve he has. 11/17; the patient has a new wound on the right medial thigh last week. He thinks this is because he did not have a underlying stocking for his thigh juxta lite apparatus. He now has this. The area is fairly large and somewhat angry but I do not think he has underlying cellulitis. ooHe has a intact blister on the right anterior tibial area. ooSmall wound on the right great toe dorsally ooSmall area on the medial left calf. 11/30; the patient does not have any open areas on his right leg and we did not take his juxta lite stocking off. However he states that on Friday his compression wrap fell down lodging around his upper mid calf area. As usual this creates a lot of problems for him. He called urgently today to be seen for a nurse visit however the nurse visit turned into a provider visit because of extreme erythema and pain in the left anterior tibia extending laterally and posteriorly. The area that is problematic is extensive 10/06/2019 upon evaluation today patient actually appears to be doing poorly in regard to his left lower extremity. He Dr. Leanord Hawking did place him on doxycycline this past Monday apparently due to the fact that he was doing much worse in regard to this left leg. Fortunately  the doxycycline does seem to be helping. Unfortunately we are still having a very difficult time getting his edema under any type of control in order to anticipate discharge at some point. The only way were really able to control his lymphedema really is with compression wraps and that has only even seemingly  temporary. He has been seeing a lymphedema clinic they are trying to help in this regard but still this has been somewhat frustrating in general for the patient. 10/13/19 on evaluation today patient appears to be doing excellent with regard to his right lower extremity as far as the wounds are concerned. His swelling is still quite extensive unfortunately. He is still having a lot of drainage from the thigh areas bilaterally which is unfortunate. He's been going to lymphedema clinic but again he still really does not have this edema under control as far as his lower extremities are concern. With regard to his left lower extremity this seems to be improving and I do believe the doxycycline has been of benefit for him. He is about to complete the doxycycline. 10/20/2019 on evaluation today patient appears to be doing poorly in regard to his bilateral lower extremities. More in the right thigh he has a lot of irritation at this site unfortunately. In regard to the left lower extremity the wrap was not quite as high it appears and does seem to have caused him some trouble as well. Fortunately there is no evidence of systemic infection though he does have some blue-green drainage which has me concerned for the possibility of Pseudomonas. He tells me he is previously taking Cipro without complications and he really does not care for Levaquin however due to some of the side effects he has. He is not allergic to any medications specifically antibiotics that were aware of. 10/27/2019 on evaluation today patient actually does appear to be for the most part doing better when compared to last week's  evaluation. With that being said he still has multiple open wounds over the bilateral lower extremities. He actually forgot to start taking the Cipro and states that he still has the whole bottle. He does have several new blisters on left lower extremity today I think I would recommend he go ahead and take the Cipro based on what I am seeing at this point. 12/30-Patient comes at 1 week visit, 4 layer compression wraps on the left and Unna boot on the right, primary dressing Xtrasorb and silver alginate. Patient is taking his Cipro and has a few more days left probably 5-6, and the legs are doing better. He states he is using his compressions devices which I believe he has 11/10/2019 on evaluation today patient actually appears to be much better than last time I saw him 2 weeks ago. His wounds are significantly improved and overall I am very pleased in this regard. Fortunately there is no signs of active infection at this time. He is just a couple of days away from completing Cipro. Overall his edema is much better he has been using his lymphedema pumps which I think is also helping at this point. 11/17/2019 on evaluation today patient appears to be doing excellent in regard to his wounds in general. His legs are swollen but not nearly as much as they have been in the past. Fortunately he is tolerating the compression wraps without complication. No fevers, chills, nausea, vomiting, or diarrhea. He does have some erythema however in the distal portion of his right lower extremity specifically around the forefoot and toes there is a little bit of warmth here as well. 11/24/2019 on evaluation today patient appears to be doing well with regard to his right lower extremity I really do not see any open wounds at this point. His left lower extremity does have several open areas and his right medial  thigh also is open. Other than this however overall the patient seems to be making good progress and I am very  pleased at this point. 12/01/2019 on evaluation today patient appears to be doing poorly at this point in regard to his left lower extremity has several new blisters despite the fact that we have him in compression wraps. In fact he had a 4-layer compression wrap, his upper thigh wrapped from lymphedema clinic, and a juxta light over top of the 4 layer compression wrap the lymphedema clinic applied and despite all this he still develop blisters underneath. Obviously this does have me concerned about the fact that unfortunately despite what we are doing to try to get wounds healed he continues to have new areas arise I do not think he is ever good to be at the point where he can realistically just use wraps at home to keep things under control. Typically when we heal him it takes about 1-2 days before he is back in the clinic with severe breakdown and blistering of his lower extremities bilaterally. This is happened numerous times in the past. Unfortunately I think that we may need some help as far as overall fluid overload to kind of limit what we are seeing and get things under better control. 12/08/2019 on evaluation today patient presents for follow-up concerning his ongoing bilateral lower extremity edema. Unfortunately he is still having quite a bit of swelling the compression wraps are controlling this to some degree but he did see Dr. Rennis GoldenHilty his cardiologist I do have that available for review today as far as the appointment was concerned that was on 12/06/2019. Obviously that she has been 2 days ago. The patient states that he is only been taking the Lasix 80 mg 1 time a day he had told me previously he was taking this twice a day. Nonetheless Dr. Rennis GoldenHilty recommended this be up to 80 mg 2 times a day for the patient as he did appear to be fluid overloaded. With that being said the patient states he did this yesterday and he was unable to go anywhere or do anything due to the fact that he was  constantly having to urinate. Nonetheless I think that this is still good to be something that is important for him as far as trying to get his edema under control at all things that he is going to be able to just expect his wounds to get under control and things to be better without going through at least a period of time where he is trying to stabilize his fluid management in general and I think increasing the Lasix is likely the first step here. It was also mentioned the possibility that the patient may require metolazone. With that being said he wanted to have the patient take Lasix twice a day first and then reevaluating 2 months to see where things stand. 12/15/2019 upon evaluation today patient appears to be doing regard to his legs although his toes are showing some signs of weeping especially on the left at this point to some degree on the right. There does not appear to be any signs of active infection and overall I do feel like the compression wraps are doing well for him but he has not been able to take the Lasix at home and the increased dose that Dr. Rennis GoldenHilty recommended. He tells me that just not go to be feasible for him. Nonetheless I think in this case he should probably send a message  to Dr. [name] in order to discuss options from the standpoint of possible admission to get the fluid off or otherwise going forward. 12/22/2019 upon evaluation today patient appears to be doing fairly well with regard to his lower extremities at this point. In fact he would be doing excellent if it was not for the fact that his right anterior thigh apparently had an allergic reaction to adhesive tape that he used. The wound itself that we have been monitoring actually appears to be healed. There is a lot of irritation at this point. 12/29/2019 upon evaluation today patient appears to be doing well in regard to his lower extremities. His left medial thigh is open and somewhat draining today but this is the  only region that is open the right has done much better with the treatment utilizing the steroid cream that I prescribed for him last week. Overall I am pleased in that regard. Fortunately there is no signs of active infection at this time. No fevers, chills, nausea, vomiting, or diarrhea. Objective Constitutional Obese and well-hydrated in no acute distress. Vitals Time Taken: 10:30 AM, Height: 70 in, Weight: 380.2 lbs, BMI: 54.5, Temperature: 97.8 F, Pulse: 58 bpm, Respiratory Rate: 22 breaths/min, Blood Pressure: 141/47 mmHg. Respiratory normal breathing without difficulty. Psychiatric this patient is able to make decisions and demonstrates good insight into disease process. Alert and Oriented x 3. pleasant and cooperative. General Notes: Patient's wound bed currently again on the right medial thigh has healed the left medial thigh is open which was not last week everything else appears to be closed at this point he still only taking 40 mg of Lasix a day he will not increase this due to having to urinate more frequently. Integumentary (Hair, Skin) Wound #145R status is Healed - Epithelialized. Original cause of wound was Gradually Appeared. The wound is located on the Right,Medial Upper Leg. The wound measures 0cm length x 0cm width x 0cm depth; 0cm^2 area and 0cm^3 volume. There is no tunneling or undermining noted. There is a none present amount of drainage noted. The wound margin is flat and intact. There is no granulation within the wound bed. There is no necrotic tissue within the wound bed. Wound #156 status is Healed - Epithelialized. Original cause of wound was Blister. The wound is located on the Left,Dorsal Foot. The wound measures 0cm length x 0cm width x 0cm depth; 0cm^2 area and 0cm^3 volume. There is no tunneling or undermining noted. There is a none present amount of drainage noted. The wound margin is distinct with the outline attached to the wound base. There is no  granulation within the wound bed. There is no necrotic tissue within the wound bed. Wound #157 status is Healed - Epithelialized. Original cause of wound was Blister. The wound is located on the Left Toe Great. The wound measures 0cm length x 0cm width x 0cm depth; 0cm^2 area and 0cm^3 volume. There is no tunneling or undermining noted. There is a none present amount of drainage noted. The wound margin is flat and intact. There is no granulation within the wound bed. There is no necrotic tissue within the wound bed. Wound #158 status is Open. Original cause of wound was Blister. The wound is located on the Left Upper Leg. The wound measures 0.7cm length x 5cm width x 0.1cm depth; 2.749cm^2 area and 0.275cm^3 volume. There is Fat Layer (Subcutaneous Tissue) Exposed exposed. There is no tunneling or undermining noted. There is a medium amount of serosanguineous drainage noted.  There is medium (34-66%) pink granulation within the wound bed. There is a medium (34-66%) amount of necrotic tissue within the wound bed including Adherent Slough. 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 Post procedure Diagnosis Wound #: Same as Pre-Procedure Plan Follow-up Appointments: Return Appointment in 1 week. Dressing Change Frequency: Wound #[id number] Left Upper Leg: Change Dressing every other day. Skin Barriers/Peri-Wound Care: Moisturizing lotion - to both legs daily TCA Cream or Ointment - to right thigh daily Wound  Cleansing: May shower and wash wound with soap and water. Primary Wound Dressing: Wound #158 Left Upper Leg: Calcium Alginate with Silver Secondary Dressing: Wound #158 Left Upper Leg: ABD pad - secure with tape Edema Control: 4 layer compression: Left lower extremity Avoid standing for long periods of time Elevate legs to the level of the heart or above for 30 minutes daily and/or when sitting, a frequency of: - throughout the day Exercise regularly Support Garment 20-30 mm/Hg pressure to: - juxtalite or xtremit-ease to right leg daily (tubigrip to foot) Segmental Compressive Device. - lymphadema pumps 60 min 2 times per day Other: - lymphadema wraps to upper leg per [facility] clinic 1. My suggestion at this time is can be that we going to continue with the current wound care measures utilizing the 4 layer compression wrap to left lower extremity. He did not have a garment with him today and so we did not apply anything to this leg. He does have juxta lites for his thigh area and then he has a juxta light for his right lower leg as well he will be seeing lymphedema clinic tomorrow. 2. I am also going to suggest that we go ahead and continue with the silver alginate dressing for the left medial thigh and he will use his juxta fit wraps that were ordered by lymphedema clinic over this area as well. We will see patient back for reevaluation in 1 week here in the clinic. If anything worsens or changes patient will contact our office for additional recommendations.Patient was advised to bring his Velcro compression wrap for the left lower extremity next week as this is likely get a be healed and should be ready to transition at that point. Electronic Signature(s) Signed: 12/29/2019 11:57:20 AM By: Lenda Kelp PA-C Entered By: Lenda Kelp on 12/29/2019 11:57:20 -------------------------------------------------------------------------------- SuperBill Details Patient Name: Date of  Service: MARKEY, [name] 12/29/2019 Medical Record [id number] Patient Account Number: [id number] Date of Birth/Sex: Treating RN: 02-Jan-1951 (69 y.o. 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 of bilateral lower extremity I89.0 Lymphedema, not elsewhere classified E11.622 Type 2 diabetes mellitus with other skin ulcer 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, unspecified L03.116 Cellulitis of left lower limb Facility Procedures CPT4 Code Description: [id number] (Facility Use Only) [phone] - APPLY  MULTLAY COMPRS LWR LT LEG Modifier: Quantity: 1 Physician Procedures CPT4: Description Modifier Quantity Code [id number] 99213 - WC PHYS LEVEL 3 - EST PT 1 ICD-10 Diagnosis Description 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 skin L97.221 Non-pressure chronic ulcer of left calf limited to  breakdown of skin 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

## 2019-12-30 DIAGNOSIS — I890 Lymphedema, not elsewhere classified: Secondary | ICD-10-CM | POA: Diagnosis not present

## 2019-12-30 DIAGNOSIS — R29898 Other symptoms and signs involving the musculoskeletal system: Secondary | ICD-10-CM | POA: Diagnosis not present

## 2019-12-30 NOTE — Progress Notes (Signed)
TAREEK, SABO ([id number]) Visit Report for 12/22/2019 Arrival Information Details Patient Name: Date of Service: JAZEN, SPRAGGINS 12/22/2019 10:15 AM Medical Record [id number] Patient Account Number: [id number] Date of Birth/Sex: Treating RN: [birth year] (69 y.o. [name] Primary Care Laverda Stribling: [name] Other Clinician: Referring Virga Haltiwanger: Treating Weda Baumgarner/Extender:[name], Dema Severin, [name] in Treatment: 204 Visit Information History Since Last Visit Walker Added or deleted any medications: No Patient Arrived: Any new allergies or adverse reactions: No Arrival Time: 10:26 Had a fall or experienced change in No Accompanied By: alone activities of daily living that may affect Transfer Assistance: None risk of falls: Patient Identification Verified: Yes Signs or symptoms of abuse/neglect since last No Secondary Verification Process Completed: Yes visito Patient Requires Transmission-Based No Hospitalized since last visit: No Precautions: Implantable device outside of the clinic excluding No Patient Has Alerts: Yes cellular tissue based products placed in the center since last visit: Has Dressing in Place as Prescribed: Yes Has Compression in Place as Prescribed: Yes Pain Present Now: No Electronic Signature(s) Signed: 12/30/2019 8:56:47 AM By: Levan Hurst RN, BSN Entered By: Levan Hurst on 12/22/2019 10:28:34 -------------------------------------------------------------------------------- Compression Therapy Details Patient Name: Date of Service: [name] 12/22/2019 10:15 AM Medical Record [id number] Patient Account Number: [id number] Date of Birth/Sex: Treating RN: 1951-01-24 (69 y.o. [name] Primary Care Haillee Johann: [name] Other Clinician: Referring Ciclaly Mulcahey: Treating Martavius Lusty/Extender:[name], Dema Severin, [name] in Treatment: 204 Compression Therapy Performed for Wound NonWound Condition  Lymphedema - Right Leg Assessment: Performed By: Clinician [name], RN Compression Type: [name] Post Procedure Diagnosis Same as Pre-procedure Electronic Signature(s) Signed: 12/24/2019 6:14:01 PM By: Baruch Gouty RN, BSN Entered By: Baruch Gouty on 12/22/2019 11:23:57 -------------------------------------------------------------------------------- Compression Therapy Details Patient Name: Date of Service: ALVON, NYGAARD 12/22/2019 10:15 AM Medical Record [id number] Patient Account Number: [id number] Date of Birth/Sex: Treating RN: [birth year] (69 y.o. [name] Primary Care Inmer Nix: [name] Other Clinician: Referring Rula Keniston: Treating Brysin Towery/Extender:[name], Dema Severin, [name] in Treatment: 204 Compression Therapy Performed for Wound Wound #156 Left,Dorsal Foot Assessment: Performed By: Clinician [name], RN Compression Type: Four Layer Post Procedure Diagnosis Same as Pre-procedure Electronic Signature(s) Signed: 12/24/2019 6:14:01 PM By: Baruch Gouty RN, BSN Entered By: Baruch Gouty on 12/22/2019 11:24:20 -------------------------------------------------------------------------------- Encounter Discharge Information Details Patient Name: Date of Service: LAVIN, PETTEWAY 12/22/2019 10:15 AM Medical Record [id number] Patient Account Number: [id number] Date of Birth/Sex: Treating RN: 1951-08-15 (69 y.o. [name] Primary Care Williard Keller: [name] Other Clinician: Referring Roxie Kreeger: Treating Markiyah Gahm/Extender:[name], Dema Severin, [name] in Treatment: 204 Encounter Discharge Information Items Discharge Condition: Stable Ambulatory Status: Walker Discharge Destination: Home Telephoned: Yes Orders Sent: Yes Transportation: Private Auto Accompanied By: self Schedule Follow-up Appointment: Yes Clinical Summary of Care: Electronic Signature(s) Signed: 12/22/2019 5:44:15 PM By: Deon Pilling Entered By: Deon Pilling on 12/22/2019 12:16:01 -------------------------------------------------------------------------------- Lower Extremity Assessment Details Patient Name: Date of Service: SURESH, AUDI 12/22/2019 10:15 AM Medical Record [id number] Patient Account Number: [id number] Date of Birth/Sex: Treating RN: 1951-07-24 (69 y.o. [name] Primary Care Meklit Cotta: [name] Other Clinician: Referring Soma Lizak: Treating Mariene Dickerman/Extender:[name], Dema Severin, [name] in Treatment: 204 Edema Assessment Assessed: [Left: No] [Right: No] Edema: [Left: Yes] [Right: Yes] Calf Left: Right: Point of Measurement: 31 cm From Medial Instep 39 cm 39.2 cm Ankle Left: Right: Point of Measurement: 12 cm From Medial Instep 25.2 cm 27 cm Vascular Assessment Pulses: Dorsalis Pedis Palpable: [Left:Yes] [Right:Yes] Electronic Signature(s) Signed: 12/30/2019 8:56:47  AM By: Levan Hurst RN, BSN Entered By: Levan Hurst on 12/22/2019 10:36:32 -------------------------------------------------------------------------------- Multi-Disciplinary Care Plan Details Patient Name: Date of Service: MAYUR, DUMAN 12/22/2019 10:15 AM Medical Record [id number] Patient Account Number: [id number] Date of Birth/Sex: Treating RN: 04/21/1951 (69 y.o. [name] Primary Care Dayana Dalporto: [name] Other Clinician: Referring Aolanis Crispen: Treating Inaaya Vellucci/Extender:[name], Dema Severin, [name] in Treatment: 204 Active Inactive Venous Leg Ulcer Nursing Diagnoses: Actual venous Insuffiency (use after diagnosis is confirmed) Goals: Patient will maintain optimal edema control Date Initiated: 09/10/2016 Target Resolution Date: 12/29/2019 Goal Status: Active Verify adequate tissue perfusion prior to therapeutic compression application Date Initiated: 09/10/2016 Date Inactivated: 11/28/2016 Goal Status: Met Interventions: Assess peripheral edema  status every visit. Compression as ordered Provide education on venous insufficiency Notes: edema not contolled above wraps, pt not using lymoh pumps regularly Wound/Skin Impairment Nursing Diagnoses: Impaired tissue integrity Goals: Patient/caregiver will verbalize understanding of skin care regimen Date Initiated: 09/10/2016 Target Resolution Date: 12/29/2019 Goal Status: Active Interventions: Assess patient/caregiver ability to perform ulcer/skin care regimen upon admission and as needed Assess ulceration(s) every visit Provide education on ulcer and skin care Notes: Electronic Signature(s) Signed: 12/24/2019 6:14:01 PM By: Baruch Gouty RN, BSN Entered By: Baruch Gouty on 12/22/2019 11:22:28 -------------------------------------------------------------------------------- Pain Assessment Details Patient Name: Date of Service: [name] 12/22/2019 10:15 AM Medical Record [id number] Patient Account Number: [id number] Date of Birth/Sex: Treating RN: May 03, 1951 (69 y.o. [name] Primary Care Zalma Channing: [name] Other Clinician: Referring Keylon Labelle: Treating Kengo Sturges/Extender:[name], Dema Severin, [name] in Treatment: 204 Active Problems Location of Pain Severity and Description of Pain Patient Has Paino No Site Locations Pain Management and Medication Current Pain Management: Electronic Signature(s) Signed: 12/30/2019 8:56:47 AM By: Levan Hurst RN, BSN Entered By: Levan Hurst on 12/22/2019 10:29:10 -------------------------------------------------------------------------------- Patient/Caregiver Education Details Patient Name: Date of Service: [name] 2/17/2021andnbsp10:15 AM Medical Record [id number] Patient Account Number: [id number] Date of Birth/Gender: Treating RN: Aug 29, 1951 (68 y.o. [name] Primary Care Physician: [name] Other Clinician: Referring Physician: Treating  Physician/Extender:[name], Dema Severin, [name] in Treatment: 204 Education Assessment Education Provided To: Patient Education Topics Provided Venous: Methods: Explain/Verbal Responses: Reinforcements needed, State content correctly Wound/Skin Impairment: Methods: Explain/Verbal Responses: Reinforcements needed, State content correctly Electronic Signature(s) Signed: 12/24/2019 6:14:01 PM By: Baruch Gouty RN, BSN Entered By: Baruch Gouty on 12/22/2019 11:22:54 -------------------------------------------------------------------------------- Wound Assessment Details Patient Name: Date of Service: JERMAYNE, SWEENEY 12/22/2019 10:15 AM Medical Record [id number] Patient Account Number: [id number] Date of Birth/Sex: Treating RN: 08/20/1951 (68 y.o. [name] Primary Care Sharilyn Geisinger: [name] Other Clinician: Referring Medhansh Brinkmeier: Treating Danajah Birdsell/Extender:[name], Dema Severin, [name] in Treatment: 204 Wound Status Wound Number: [id number] Primary Lymphedema Etiology: Wound Location: Right Upper Leg - Medial Wound Open Wounding Event: Gradually Appeared Status: Date Acquired: 09/12/2019 Comorbid Chronic sinus problems/congestion, Weeks Of Treatment: 14 History: Arrhythmia, Hypertension, Peripheral Arterial Clustered Wound: Yes Disease, Type II Diabetes, History of Burn, Gout, Confinement Anxiety Photos Wound Measurements Length: (cm) 1 Width: (cm) 1 Depth: (cm) 0.1 Clustered Quantity: 1 Area: (cm) 0.785 Volume: (cm) 0.079 Wound Description Classification: Partial Thickness Wound Margin: Flat and Intact Exudate Amount: Medium Exudate Type: Serous Exudate Color: amber Wound Bed Granulation Amount: Large (67-100%) Granulation Quality: Red Necrotic Amount: None Present (0%) After Cleansing: No rino No Exposed Structure sed: No Subcutaneous Tissue) Exposed: No sed: No sed: No ed: No d: No % Reduction in Area: 33.4% %  Reduction in Volume: 33.1% Epithelialization: Medium (34-66%) Tunneling: No Undermining:  No Foul Odor Slough/Fib Fascia Expo Fat Layer ( Tendon Expo Muscle Expo Joint Expos Bone Expose Electronic Signature(s) Signed: 12/22/2019 4:27:12 PM By: Mikeal Hawthorne EMT/HBOT Signed: 12/24/2019 6:14:01 PM By: Baruch Gouty RN, BSN Entered By: Mikeal Hawthorne on 12/22/2019 14:55:22 -------------------------------------------------------------------------------- Wound Assessment Details Patient Name: Date of Service: RAMZY, CAPPELLETTI 12/22/2019 10:15 AM Medical Record [id number] Patient Account Number: [id number] Date of Birth/Sex: Treating RN: 1951/03/20 (69 y.o. [name] Primary Care Jamel Dunton: [name] Other Clinician: Referring Shuntay Everetts: Treating Marshun Duva/Extender:[name], Dema Severin, [name] in Treatment: 204 Wound Status Wound Number: [id number] Primary Diabetic Wound/Ulcer of the Lower Extremity Etiology: Wound Location: Left Foot - Dorsal Wound Open Wounding Event: Blister Status: Date Acquired: 11/24/2019 Comorbid Chronic sinus problems/congestion, Weeks Of Treatment: 4 History: Arrhythmia, Hypertension, Peripheral Arterial Clustered Wound: No Disease, Type II Diabetes, History of Burn, Gout, Confinement Anxiety Photos Wound Measurements Length: (cm) 1 % Reduction Width: (cm) 1.3 % Reduction Depth: (cm) 0.1 Epitheliali Area: (cm) 1.021 Tunneling: Volume: (cm) 0.102 Underminin Wound Description Classification: Grade 1 Foul Odor Wound Margin: Distinct, outline attached Slough/Fib Exudate Amount: Medium Exudate Type: Serous Exudate Color: amber Wound Bed Granulation Amount: Large (67-100%) Granulation Quality: Pink Fascia Exp Necrotic Amount: None Present (0%) Fat Layer Tendon Exp Muscle Exp Joint Expo Bone Expos After Cleansing: No rino Yes Exposed Structure osed: No (Subcutaneous Tissue) Exposed: No osed: No osed: No sed:  No ed: No in Area: 7.2% in Volume: 7.3% zation: Medium (34-66%) No g: No Electronic Signature(s) Signed: 12/22/2019 4:27:12 PM By: Mikeal Hawthorne EMT/HBOT Signed: 12/24/2019 6:14:01 PM By: Baruch Gouty RN, BSN Entered By: Mikeal Hawthorne on 12/22/2019 14:56:04 -------------------------------------------------------------------------------- Wound Assessment Details Patient Name: Date of Service: [name] 12/22/2019 10:15 AM Medical Record [id number] Patient Account Number: [id number] Date of Birth/Sex: Treating RN: [birth year] (69 y.o. [name] Primary Care Holley Kocurek: [name] Other Clinician: Referring Sandhya Denherder: Treating Libertie Hausler/Extender:[name], Dema Severin, [name] in Treatment: 204 Wound Status Wound Number: [id number] Primary Diabetic Wound/Ulcer of the Lower Extremity Etiology: Wound Location: Left Toe Great Wound Open Wounding Event: Blister Status: Date Acquired: 12/22/2019 Comorbid Chronic sinus problems/congestion, Weeks Of Treatment: 0 History: Arrhythmia, Hypertension, Peripheral Arterial Clustered Wound: No Disease, Type II Diabetes, History of Burn, Gout, Confinement Anxiety Photos Wound Measurements Length: (cm) 0.6 Width: (cm) 0.4 Depth: (cm) 0.1 Area: (cm) 0.188 Volume: (cm) 0.019 Wound Description Classification: Grade 1 Wound Margin: Flat and Intact Exudate Amount: Small Exudate Type: Serous Exudate Color: amber Wound Bed Granulation Amount: Large (67-100%) Granulation Quality: Pink Necrotic Amount: None Present (0%) ter Cleansing: No no No Exposed Structure ed: No ubcutaneous Tissue) Exposed: Yes ed: No ed: No d: No : No % Reduction in Area: 0% % Reduction in Volume: 0% Epithelialization: None Tunneling: No Undermining: No Foul Odor Af Slough/Fibri Fascia Expos Fat Layer (S Tendon Expos Muscle Expos Joint Expose Bone Exposed Electronic Signature(s) Signed: 12/22/2019 4:27:12 PM By: Mikeal Hawthorne EMT/HBOT Signed: 12/24/2019 6:14:01 PM By: Baruch Gouty RN, BSN Entered By: Mikeal Hawthorne on 12/22/2019 15:04:37 -------------------------------------------------------------------------------- Vitals Details Patient Name: Date of Service: [name] 12/22/2019 10:15 AM Medical Record [id number] Patient Account Number: [id number] Date of Birth/Sex: Treating RN: October 02, 1951 (69 y.o. [name] Primary Care Kassadie Pancake: [name] Other Clinician: Referring Tana Trefry: Treating Marlon Vonruden/Extender:[name], Dema Severin, [name] in Treatment: 204 Vital Signs Time Taken: 10:28 Temperature (F): 98.2 Height (in): 70 Pulse (bpm): 63 Weight (lbs): 380.2 Respiratory Rate (breaths/min): 18 Body Mass Index (BMI): 54.5 Blood Pressure (mmHg): 161/72  Capillary Blood Glucose (mg/dl): 106 Reference Range: 80 - 120 mg / dl Notes glucose per pt report Electronic Signature(s) Signed: 12/30/2019 8:56:47 AM By: Levan Hurst RN, BSN Entered By: Levan Hurst on 12/22/2019 10:29:03

## 2020-01-04 ENCOUNTER — Other Ambulatory Visit: Payer: Self-pay

## 2020-01-04 NOTE — Progress Notes (Signed)
JOSIA, CUEVA ([id number]) Visit Report for 12/29/2019 Arrival Information Details Patient Name: Date of Service: JENNY, OMDAHL 12/29/2019 10:15 AM Medical Record [id number] Patient Account Number: [id number] Date of Birth/Sex: Treating RN: Oct 23, 1951 (69 y.o. [name]) [name] Primary Care Edlin Ford: [name] Other Clinician: Referring Neilan Rizzo: Treating Jaely Silman/Extender:[name], Dema Severin, [name] in Treatment: 47 Visit Information History Since Last Visit Walker All ordered tests and consults were completed: No Patient Arrived: Added or deleted any medications: No Arrival Time: 10:14 Any new allergies or adverse reactions: No Accompanied By: self Had a fall or experienced change in No Transfer Assistance: None activities of daily living that may affect Patient Identification Verified: Yes risk of falls: Secondary Verification Process Completed: Yes Signs or symptoms of abuse/neglect since last No Patient Requires Transmission-Based No visito Precautions: Hospitalized since last visit: No Patient Has Alerts: Yes Implantable device outside of the clinic excluding No cellular tissue based products placed in the center since last visit: Has Dressing in Place as Prescribed: Yes Has Compression in Place as Prescribed: Yes Pain Present Now: No Electronic Signature(s) Signed: 01/04/2020 9:14:02 AM By: [name] RN Entered By: [name] on 12/29/2019 10:30:37 -------------------------------------------------------------------------------- Compression Therapy Details Patient Name: Date of Service: ROMEO, ZIELINSKI 12/29/2019 10:15 AM Medical Record [id number] Patient Account Number: [id number] Date of Birth/Sex: Treating RN: [birth year] (69 y.o. [name] Primary Care Syriah Delisi: [name] Other Clinician: Referring Arrayah Connors: Treating Dail Meece/Extender:[name], Dema Severin, [name] in Treatment: 205 Compression Therapy  Performed for Wound NonWound Condition Lymphedema - Left Leg Assessment: Performed By: Clinician [name], RN Compression Type: Four Layer Post Procedure Diagnosis Same as Pre-procedure Electronic Signature(s) Signed: 12/29/2019 6:08:21 PM By: Baruch Gouty RN, BSN Entered By: Baruch Gouty on 12/29/2019 11:27:18 -------------------------------------------------------------------------------- Encounter Discharge Information Details Patient Name: Date of Service: ADI, DORO 12/29/2019 10:15 AM Medical Record [id number] Patient Account Number: [id number] Date of Birth/Sex: Treating RN: November 17, 1950 (69 y.o. [name] Primary Care Jaremy Nosal: [name] Other Clinician: Referring Georjean Toya: Treating Landon Truax/Extender:[name], Dema Severin, [name] in Treatment: 205 Encounter Discharge Information Items Discharge Condition: Stable Ambulatory Status: Walker Discharge Destination: Home Transportation: Private Auto Accompanied By: self Schedule Follow-up Appointment: Yes Clinical Summary of Care: Electronic Signature(s) Signed: 12/30/2019 6:07:18 PM By: Deon Pilling Entered By: Deon Pilling on 12/29/2019 12:14:17 -------------------------------------------------------------------------------- Lower Extremity Assessment Details Patient Name: Date of Service: ZIGGY, CHANTHAVONG 12/29/2019 10:15 AM Medical Record [id number] Patient Account Number: [id number] Date of Birth/Sex: Treating RN: Feb 24, 1951 (69 y.o. [name]) [name] Primary Care Sully Manzi: [name] Other Clinician: Referring Yarianna Varble: Treating [name]/Extender:[name], Dema Severin, [name] in Treatment: 205 Edema Assessment Assessed: [Left: No] [Right: No] Edema: [Left: Yes] [Right: Yes] Calf Left: Right: Point of Measurement: 31 cm From Medial Instep 39 cm 39.2 cm Ankle Left: Right: Point of Measurement: 12 cm From Medial Instep 25.2 cm 27 cm Electronic  Signature(s) Signed: 01/04/2020 9:14:02 AM By: [name] RN Entered By: [name] on 12/29/2019 10:31:30 -------------------------------------------------------------------------------- [place] Details Patient Name: Date of Service: JAYDEEN, ODOR 12/29/2019 10:15 AM Medical Record [id number] Patient Account Number: [id number] Date of Birth/Sex: Treating RN: 01/10/1951 (69 y.o. [name] Primary Care Dewaun Kinzler: [name] Other Clinician: Referring Jeanluc Wegman: Treating Day Greb/Extender:[name], Dema Severin, [name] in Treatment: 205 Active Inactive Venous Leg Ulcer Nursing Diagnoses: Actual venous Insuffiency (use after diagnosis is confirmed) Goals: Patient will maintain optimal edema control Date Initiated: 09/10/2016 Target Resolution Date: 01/26/2020 Goal Status: Active Verify adequate tissue perfusion prior  to therapeutic compression application Date Initiated: 09/10/2016 Date Inactivated: 11/28/2016 Goal Status: Met Interventions: Assess peripheral edema status every visit. Compression as ordered Provide education on venous insufficiency Notes: edema not contolled above wraps, pt not using lymoh pumps regularly Wound/Skin Impairment Nursing Diagnoses: Impaired tissue integrity Goals: Patient/caregiver will verbalize understanding of skin care regimen Date Initiated: 09/10/2016 Target Resolution Date: 01/26/2020 Goal Status: Active Interventions: Assess patient/caregiver ability to perform ulcer/skin care regimen upon admission and as needed Assess ulceration(s) every visit Provide education on ulcer and skin care Notes: Electronic Signature(s) Signed: 12/29/2019 6:08:21 PM By: Baruch Gouty RN, BSN Entered By: Baruch Gouty on 12/29/2019 11:18:03 -------------------------------------------------------------------------------- Pain Assessment Details Patient Name: Date of Service: [name] 12/29/2019  10:15 AM Medical Record [id number] Patient Account Number: [id number] Date of Birth/Sex: Treating RN: 02-Dec-1950 (69 y.o. [name]) [name] Primary Care Lamar Naef: [name] Other Clinician: Referring Malyia Moro: Treating Peighton Edgin/Extender:[name], Dema Severin, [name] in Treatment: 205 Active Problems Location of Pain Severity and Description of Pain Patient Has Paino No Site Locations Pain Management and Medication Current Pain Management: Electronic Signature(s) Signed: 01/04/2020 9:14:02 AM By: [name] RN Entered By: [name] on 12/29/2019 10:31:24 -------------------------------------------------------------------------------- Patient/Caregiver Education Details Patient Name: Date of Service: [name] 2/24/2021andnbsp10:15 AM Medical Record [id number] Patient Account Number: [id number] Date of Birth/Gender: Treating RN: 1951/07/31 (69 y.o. [name] Primary Care Physician: [name] Other Clinician: Referring Physician: Treating Physician/Extender:[name], Dema Severin, [name] in Treatment: 52 Education Assessment Education Provided To: Patient Education Topics Provided Venous: Methods: Explain/Verbal Responses: Reinforcements needed, State content correctly Wound/Skin Impairment: Methods: Explain/Verbal Responses: Reinforcements needed, State content correctly Electronic Signature(s) Signed: 12/29/2019 6:08:21 PM By: Baruch Gouty RN, BSN Entered By: Baruch Gouty on 12/29/2019 11:18:29 -------------------------------------------------------------------------------- Wound Assessment Details Patient Name: Date of Service: [name] 12/29/2019 10:15 AM Medical Record [id number] Patient Account Number: [id number] Date of Birth/Sex: Treating RN: [birth year] (68 y.o. [name]) [name] Primary Care Bani Gianfrancesco: [name] Other Clinician: Referring Rexford Prevo: Treating Lakyia Behe/Extender:[name],  Dema Severin, [name] in Treatment: 205 Wound Status Wound Number: [id number] Primary Lymphedema Etiology: Wound Location: Right Upper Leg - Medial Wound Healed - Epithelialized Wounding Event: Gradually Appeared Status: Date Acquired: 09/12/2019 Comorbid Chronic sinus problems/congestion, Weeks Of Treatment: 15 History: Arrhythmia, Hypertension, Peripheral Arterial Clustered Wound: Yes Disease, Type II Diabetes, History of Burn, Gout, Confinement Anxiety Photos Wound Measurements Length: (cm) 0 % Reductio Width: (cm) 0 % Reductio Depth: (cm) 0 Epithelial Clustered Quantity: 1 Tunneling: Area: (cm) 0 Undermini Volume: (cm) 0 Wound Description Classification: Partial Thickness Wound Margin: Flat and Intact Exudate Amount: None Present Wound Bed Granulation Amount: None Present (0%) Necrotic Amount: None Present (0%) Foul Odor After Cleansing: No Slough/Fibrino No Exposed Structure Fascia Exposed: No Fat Layer (Subcutaneous Tissue) Exposed: No Tendon Exposed: No Muscle Exposed: No Joint Exposed: No Bone Exposed: No n in Area: 100% n in Volume: 100% ization: Large (67-100%) No ng: No Electronic Signature(s) Signed: 12/31/2019 5:12:28 PM By: Mikeal Hawthorne EMT/HBOT Signed: 01/04/2020 9:14:02 AM By: [name] RN Entered By: Mikeal Hawthorne on 12/31/2019 16:02:22 -------------------------------------------------------------------------------- Wound Assessment Details Patient Name: Date of Service: MOUNIR, SKIPPER 12/29/2019 10:15 AM Medical Record [id number] Patient Account Number: [id number] Date of Birth/Sex: Treating RN: 30-Aug-1951 (69 y.o. [name] Primary Care Sarvesh Meddaugh: [name] Other Clinician: Referring Leana Springston: Treating Irean Kendricks/Extender:[name], Dema Severin, [name] in Treatment: 205 Wound Status Wound Number: [id number] Primary Diabetic Wound/Ulcer of the Lower Extremity Etiology: Wound Location: Left, Dorsal Foot Wound  Healed - Epithelialized Wounding Event: Blister Status: Date Acquired: 11/24/2019 Date Acquired: 11/24/2019 Comorbid Chronic sinus problems/congestion, Weeks Of Treatment: 5 History: Arrhythmia, Hypertension, Peripheral Arterial Clustered Wound: No Disease, Type II Diabetes, History of Burn, Gout, Confinement Anxiety Photos Wound Measurements Length: (cm) 0 % Reduction Width: (cm) 0 % Reduction Depth: (cm) 0 Epithelializ Area: (cm) 0 Tunneling: Volume: (cm) 0 Undermining Wound Description Classification: Grade 1 Foul Odor Af Wound Margin: Distinct, outline attached Slough/Fibri Exudate Amount: None Present Wound Bed Granulation Amount: None Present (0%) Necrotic Amount: None Present (0%) Fascia Expos Fat Layer (S Tendon Expos Muscle Expos Joint Expose Bone Exposed ter Cleansing: No no No Exposed Structure ed: No ubcutaneous Tissue) Exposed: No ed: No ed: No d: No : No in Area: 100% in Volume: 100% ation: Large (67-100%) No : No Electronic Signature(s) Signed: 12/31/2019 5:01:55 PM By: Baruch Gouty RN, BSN Signed: 12/31/2019 5:12:28 PM By: Mikeal Hawthorne EMT/HBOT Entered By: Mikeal Hawthorne on 12/31/2019 11:55:12 -------------------------------------------------------------------------------- Wound Assessment Details Patient Name: Date of Service: [name] 12/29/2019 10:15 AM Medical Record [id number] Patient Account Number: [id number] Date of Birth/Sex: Treating RN: [birth year] (68 y.o. [name]) Dolores Lory, [place] Primary Care Kameren Pargas: [name] Other Clinician: Referring Ireland Chagnon: Treating Jered Heiny/Extender:[name], Dema Severin, [name] in Treatment: 205 Wound Status Wound Number: [id number] Primary Diabetic Wound/Ulcer of the Lower Extremity Etiology: Wound Location: Left Toe Great Wound Healed - Epithelialized Wounding Event: Blister Status: Date Acquired: 12/22/2019 Comorbid Chronic sinus problems/congestion, Weeks Of Treatment:  1 History: Arrhythmia, Hypertension, Peripheral Arterial Clustered Wound: No Disease, Type II Diabetes, History of Burn, Gout, Confinement Anxiety Photos Wound Measurements Length: (cm) 0 % Reduction Width: (cm) 0 % Reduction Depth: (cm) 0 Epithelializ Area: (cm) 0 Tunneling: Volume: (cm) 0 Undermining Wound Description Classification: Grade 1 Foul Odor Af Wound Margin: Flat and Intact Slough/Fibri Exudate Amount: None Present Wound Bed Granulation Amount: None Present (0%) Necrotic Amount: None Present (0%) Fascia Expos Fat Layer (S Tendon Expos Muscle Expos Joint Expose Bone Exposed ter Cleansing: No no No Exposed Structure ed: No ubcutaneous Tissue) Exposed: No ed: No ed: No d: No : No in Area: 100% in Volume: 100% ation: Large (67-100%) No : No Electronic Signature(s) Signed: 12/31/2019 5:12:28 PM By: Mikeal Hawthorne EMT/HBOT Signed: 01/04/2020 9:14:02 AM By: [name] RN Entered By: Mikeal Hawthorne on 12/31/2019 11:56:10 -------------------------------------------------------------------------------- Wound Assessment Details Patient Name: Date of Service: [name] 12/29/2019 10:15 AM Medical Record [id number] Patient Account Number: [id number] Date of Birth/Sex: Treating RN: [birth year] (68 y.o. [name]) [name] Primary Care Kale Dols: [name] Other Clinician: Referring Polina Burmaster: Treating Amri Lien/Extender:[name], Dema Severin, [name] in Treatment: 205 Wound Status Wound Number: [id number] Primary Lymphedema Etiology: Wound Location: Left Upper Leg Wound Open Wounding Event: Blister Status: Date Acquired: 12/27/2019 Comorbid Chronic sinus problems/congestion, Weeks Of Treatment: 0 History: Arrhythmia, Hypertension, Peripheral Arterial Clustered Wound: No Disease, Type II Diabetes, History of Burn, Gout, Confinement Anxiety Photos Wound Measurements Length: (cm) 0.7 % Reduc Width: (cm) 5 % Reduc Depth: (cm) 0.1  [place] Area: (cm) 2.749 Tunnel Volume: (cm) 0.275 Underm Wound Description Full Thickness Without Exposed Support Classification: Structures Exudate Medium Amount: Exudate Serosanguineous Type: Exudate red, brown Color: Wound Bed Granulation Amount: Medium (34-66%) Granulation Quality: Pink Necrotic Amount: Medium (34-66%) Necrotic Quality: Adherent Slough Foul Odor After Cleansing: No Slough/Fibrino Yes Exposed Structure Fascia Exposed: No Fat Layer (Subcutaneous Tissue) Exposed: Yes Tendon Exposed: No Muscle Exposed: No Joint Exposed: No Bone Exposed: No tion in Area: 0% tion in Volume: 0%  ialization: None ing: No ining: No Treatment Notes Wound #158 (Left Upper Leg) 1. Cleanse With Wound Cleanser Soap and water 2. Periwound Care Moisturizing lotion TCA Cream 3. Primary Dressing Applied Calcium Alginate Ag 4. Secondary Dressing ABD Pad Electronic Signature(s) Signed: 12/31/2019 5:12:28 PM By: Mikeal Hawthorne EMT/HBOT Signed: 01/04/2020 9:14:02 AM By: [name] RN Entered By: Mikeal Hawthorne on 12/31/2019 16:01:34 -------------------------------------------------------------------------------- Vitals Details Patient Name: Date of Service: ABBIE, JABLON 12/29/2019 10:15 AM Medical Record [id number] Patient Account Number: [id number] Date of Birth/Sex: Treating RN: 12-31-1950 (68 y.o. [name]) E[place], [place] Primary Care Jackson Fetters: [name] Other Clinician: Referring Daysha Ashmore: Treating Finn Altemose/Extender:[name], Dema Severin, [name] in Treatment: 205 Vital Signs Time Taken: 10:30 Temperature (F): 97.8 Height (in): 70 Pulse (bpm): 58 Weight (lbs): 380.2 Respiratory Rate (breaths/min): 22 Body Mass Index (BMI): 54.5 Blood Pressure (mmHg): 141/47 Reference Range: 80 - 120 mg / dl Electronic Signature(s) Signed: 01/04/2020 9:14:02 AM By: [name] RN Entered By: [name] on 12/29/2019 10:31:13

## 2020-01-05 ENCOUNTER — Ambulatory Visit (INDEPENDENT_AMBULATORY_CARE_PROVIDER_SITE_OTHER): Payer: Medicare Other | Admitting: Family Medicine

## 2020-01-05 ENCOUNTER — Encounter (HOSPITAL_BASED_OUTPATIENT_CLINIC_OR_DEPARTMENT_OTHER): Payer: Medicare Other | Attending: Physician Assistant | Admitting: Physician Assistant

## 2020-01-05 ENCOUNTER — Encounter: Payer: Self-pay | Admitting: Family Medicine

## 2020-01-05 VITALS — BP 145/72 | HR 55 | Temp 98.2°F | Resp 16 | Ht 70.0 in | Wt 391.2 lb

## 2020-01-05 DIAGNOSIS — M109 Gout, unspecified: Secondary | ICD-10-CM | POA: Insufficient documentation

## 2020-01-05 DIAGNOSIS — L97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 right calf limited to breakdown of skin: Secondary | ICD-10-CM | POA: Diagnosis not present

## 2020-01-05 DIAGNOSIS — L03116 Cellulitis of left lower limb: Secondary | ICD-10-CM | POA: Insufficient documentation

## 2020-01-05 DIAGNOSIS — E1140 Type 2 diabetes mellitus with diabetic neuropathy, unspecified: Secondary | ICD-10-CM | POA: Insufficient documentation

## 2020-01-05 DIAGNOSIS — E669 Obesity, unspecified: Secondary | ICD-10-CM | POA: Insufficient documentation

## 2020-01-05 DIAGNOSIS — I4891 Unspecified atrial fibrillation: Secondary | ICD-10-CM | POA: Diagnosis not present

## 2020-01-05 DIAGNOSIS — Z6841 Body Mass Index (BMI) 40.0 and over, adult: Secondary | ICD-10-CM | POA: Insufficient documentation

## 2020-01-05 DIAGNOSIS — F419 Anxiety disorder, unspecified: Secondary | ICD-10-CM | POA: Insufficient documentation

## 2020-01-05 DIAGNOSIS — Z79899 Other long term (current) drug therapy: Secondary | ICD-10-CM

## 2020-01-05 DIAGNOSIS — E118 Type 2 diabetes mellitus with unspecified complications: Secondary | ICD-10-CM | POA: Diagnosis not present

## 2020-01-05 DIAGNOSIS — E11622 Type 2 diabetes mellitus with other skin ulcer: Secondary | ICD-10-CM | POA: Diagnosis not present

## 2020-01-05 DIAGNOSIS — L03119 Cellulitis of unspecified part of limb: Secondary | ICD-10-CM | POA: Diagnosis not present

## 2020-01-05 DIAGNOSIS — I87333 Chronic venous hypertension (idiopathic) with ulcer and inflammation of bilateral lower extremity: Secondary | ICD-10-CM | POA: Insufficient documentation

## 2020-01-05 DIAGNOSIS — L97529 Non-pressure chronic ulcer of other part of left foot with unspecified severity: Secondary | ICD-10-CM | POA: Diagnosis not present

## 2020-01-05 DIAGNOSIS — I872 Venous insufficiency (chronic) (peripheral): Secondary | ICD-10-CM

## 2020-01-05 DIAGNOSIS — L97221 Non-pressure chronic ulcer of left calf limited to breakdown of skin: Secondary | ICD-10-CM | POA: Diagnosis not present

## 2020-01-05 DIAGNOSIS — L97819 Non-pressure chronic ulcer of other part of right lower leg with unspecified severity: Secondary | ICD-10-CM | POA: Diagnosis not present

## 2020-01-05 DIAGNOSIS — E11621 Type 2 diabetes mellitus with foot ulcer: Secondary | ICD-10-CM | POA: Diagnosis not present

## 2020-01-05 DIAGNOSIS — I10 Essential (primary) hypertension: Secondary | ICD-10-CM | POA: Diagnosis not present

## 2020-01-05 DIAGNOSIS — L97129 Non-pressure chronic ulcer of left thigh with unspecified severity: Secondary | ICD-10-CM | POA: Diagnosis not present

## 2020-01-05 DIAGNOSIS — I890 Lymphedema, not elsewhere classified: Secondary | ICD-10-CM | POA: Insufficient documentation

## 2020-01-05 LAB — CBC
HCT: 38.4 % — ABNORMAL LOW (ref 39.0–52.0)
Hemoglobin: 12.6 g/dL — ABNORMAL LOW (ref 13.0–17.0)
MCHC: 32.9 g/dL (ref 30.0–36.0)
MCV: 85.6 fl (ref 78.0–100.0)
Platelets: 187 10*3/uL (ref 150.0–400.0)
RBC: 4.48 Mil/uL (ref 4.22–5.81)
RDW: 16.4 % — ABNORMAL HIGH (ref 11.5–15.5)
WBC: 6.1 10*3/uL (ref 4.0–10.5)

## 2020-01-05 LAB — BASIC METABOLIC PANEL
BUN: 22 mg/dL (ref 6–23)
CO2: 29 mEq/L (ref 19–32)
Calcium: 8.7 mg/dL (ref 8.4–10.5)
Chloride: 99 mEq/L (ref 96–112)
Creatinine, Ser: 1.33 mg/dL (ref 0.40–1.50)
GFR: 53.35 mL/min — ABNORMAL LOW (ref >60.00)
Glucose, Bld: 158 mg/dL — ABNORMAL HIGH (ref 70–99)
Potassium: 4.3 mEq/L (ref 3.5–5.1)
Sodium: 137 mEq/L (ref 135–145)

## 2020-01-05 LAB — HEMOGLOBIN A1C: Hgb A1c MFr Bld: 7.5 % — ABNORMAL HIGH (ref 4.6–6.5)

## 2020-01-05 MED ORDER — BUTALBITAL-APAP-CAFF-COD 50-325-40-30 MG PO CAPS: ORAL_CAPSULE | ORAL | 0 refills | Status: DC

## 2020-01-05 NOTE — Progress Notes (Signed)
[name] ([id number]) Visit Report for 01/05/2020 Arrival Information Details Patient Name: Date of Service: [name], [name] 01/05/2020 11:00 AM Medical Record [id number] Patient Account Number: [id number] Date of Birth/Sex: Treating RN: October 09, 1951 (69 y.o. [name] Primary Care Bader Stubblefield: [name] Other Clinician: Referring Giancarlo Askren: Treating Calen Posch/Extender:[name], Dema Severin, [name] in Treatment: 206 Visit Information History Since Last Visit Walker Added or deleted any medications: No Patient Arrived: Any new allergies or adverse reactions: No Arrival Time: 11:50 Had a fall or experienced change in No Accompanied By: self activities of daily living that may affect Transfer Assistance: None risk of falls: Patient Identification Verified: Yes Signs or symptoms of abuse/neglect since last No Secondary Verification Process Completed: Yes visito Patient Requires Transmission-Based No Hospitalized since last visit: No Precautions: Implantable device outside of the clinic excluding No Patient Has Alerts: Yes cellular tissue based products placed in the center since last visit: Has Dressing in Place as Prescribed: Yes Has Compression in Place as Prescribed: Yes Pain Present Now: No Electronic Signature(s) Signed: 01/05/2020 5:43:51 PM By: Deon Pilling Entered By: Deon Pilling on 01/05/2020 11:54:21 -------------------------------------------------------------------------------- Compression Therapy Details Patient Name: Date of Service: [name] 01/05/2020 11:00 AM Medical Record [id number] Patient Account Number: [id number] Date of Birth/Sex: Treating RN: 1951-02-11 (69 y.o. [name] Primary Care Mirian Casco: [name] Other Clinician: Referring Jhoel Stieg: Treating Jenilyn Magana/Extender:[name], Dema Severin, [name] in Treatment: 206 Compression Therapy Performed for Wound Wound #159 Right,Lateral Lower  Leg Assessment: Performed By: Clinician Levan Hurst, RN Compression Type: [name] Post Procedure Diagnosis Same as Pre-procedure Electronic Signature(s) Signed: 01/05/2020 5:54:02 PM By: Levan Hurst RN, BSN Entered By: Levan Hurst on 01/05/2020 12:45:49 -------------------------------------------------------------------------------- Compression Therapy Details Patient Name: Date of Service: [name] 01/05/2020 11:00 AM Medical Record [id number] Patient Account Number: [id number] Date of Birth/Sex: Treating RN: 04/18/1951 (69 y.o. [name] Primary Care Kaija Kovacevic: [name] Other Clinician: Referring Helvi Royals: Treating Ena Demary/Extender:[name], Dema Severin, [name] in Treatment: 206 Compression Therapy Performed for Wound NonWound Condition Lymphedema - Left Leg Assessment: Performed By: Clinician Levan Hurst, RN Compression Type: Four Layer Post Procedure Diagnosis Same as Pre-procedure Electronic Signature(s) Signed: 01/05/2020 5:54:02 PM By: Levan Hurst RN, BSN Entered By: Levan Hurst on 01/05/2020 12:46:11 -------------------------------------------------------------------------------- Lower Extremity Assessment Details Patient Name: Date of Service: [name] 01/05/2020 11:00 AM Medical Record [id number] Patient Account Number: [id number] Date of Birth/Sex: Treating RN: 1951-01-08 (69 y.o. [name] Primary Care Dawood Spitler: [name] Other Clinician: Referring Shuntell Foody: Treating Furkan Keenum/Extender:[name], Dema Severin, [name] in Treatment: 206 Edema Assessment Assessed: [Left: Yes] [Right: Yes] Edema: [Left: Yes] [Right: Yes] Calf Left: Right: Point of Measurement: 31 cm From Medial Instep 42 cm 43 cm Ankle Left: Right: Point of Measurement: 12 cm From Medial Instep 25 cm 29 cm Electronic Signature(s) Signed: 01/05/2020 5:43:51 PM By: Deon Pilling Entered By: Deon Pilling on 01/05/2020  12:04:15 -------------------------------------------------------------------------------- Multi-Disciplinary Care Plan Details Patient Name: Date of Service: [name] 01/05/2020 11:00 AM Medical Record [id number] Patient Account Number: [id number] Date of Birth/Sex: Treating RN: 09/24/1951 (69 y.o. [name] Primary Care Ayame Rena: [name] Other Clinician: Referring Tamecca Artiga: Treating Elner Seifert/Extender:[name], Dema Severin, [name] in Treatment: 206 Active Inactive Venous Leg Ulcer Nursing Diagnoses: Actual venous Insuffiency (use after diagnosis is confirmed) Goals: Patient will maintain optimal edema control Date Initiated: 09/10/2016 Target Resolution Date: 01/26/2020 Goal Status: Active Verify adequate tissue perfusion prior to therapeutic compression application Date Initiated: 09/10/2016 Date Inactivated:  11/28/2016 Goal Status: Met Interventions: Assess peripheral edema status every visit. Compression as ordered Provide education on venous insufficiency Notes: edema not contolled above wraps, pt not using lymoh pumps regularly Wound/Skin Impairment Nursing Diagnoses: Impaired tissue integrity Goals: Patient/caregiver will verbalize understanding of skin care regimen Date Initiated: 09/10/2016 Target Resolution Date: 01/26/2020 Goal Status: Active Interventions: Assess patient/caregiver ability to perform ulcer/skin care regimen upon admission and as needed Assess ulceration(s) every visit Provide education on ulcer and skin care Notes: Electronic Signature(s) Signed: 01/05/2020 5:54:02 PM By: Levan Hurst RN, BSN Entered By: Levan Hurst on 01/05/2020 17:17:16 -------------------------------------------------------------------------------- Pain Assessment Details Patient Name: Date of Service: [name] 01/05/2020 11:00 AM Medical Record [id number] Patient Account Number: [id number] Date of Birth/Sex: Treating  RN: [birth year] (69 y.o. [name] Primary Care Franciscojavier Wronski: [name] Other Clinician: Referring Kingstin Heims: Treating Letcher Schweikert/Extender:[name], Dema Severin, [name] in Treatment: 206 Active Problems Location of Pain Severity and Description of Pain Patient Has Paino No Site Locations Rate the pain. Current Pain Level: 0 Worst Pain Level: 10 Least Pain Level: 0 Tolerable Pain Level: 8 Pain Management and Medication Current Pain Management: Medication: No Cold Application: No Rest: No Massage: No Activity: No T.E.N.S.: No Heat Application: No Leg drop or elevation: No Is the Current Pain Management Adequate: Adequate How does your wound impact your activities of daily livingo Sleep: No Bathing: No Appetite: No Relationship With Others: No Bladder Continence: No Emotions: No Bowel Continence: No Work: No Toileting: No Drive: No Dressing: No Hobbies: No Electronic Signature(s) Signed: 01/05/2020 5:43:51 PM By: Deon Pilling Entered By: Deon Pilling on 01/05/2020 11:54:37 -------------------------------------------------------------------------------- Patient/Caregiver Education Details Patient Name: Date of Service: Memoli, Jamerson J. 3/3/2021andnbsp11:00 AM Medical Record [id number] Patient Account Number: [id number] Date of Birth/Gender: Treating RN: [birth year] (69 y.o. [name] Primary Care Physician: [name] Other Clinician: Referring Physician: Treating Physician/Extender:[name], Dema Severin, [name] in Treatment: 5 Education Assessment Education Provided To: Patient Education Topics Provided Wound/Skin Impairment: Methods: Explain/Verbal Responses: State content correctly Electronic Signature(s) Signed: 01/05/2020 5:54:02 PM By: Levan Hurst RN, BSN Entered By: Levan Hurst on 01/05/2020 17:17:53 -------------------------------------------------------------------------------- Wound Assessment  Details Patient Name: Date of Service: [name] 01/05/2020 11:00 AM Medical Record [id number] Patient Account Number: [id number] Date of Birth/Sex: Treating RN: [birth year] (69 y.o. [name] Primary Care Travius Crochet: [name] Other Clinician: Referring Najiyah Paris: Treating Eduard Penkala/Extender:[name], Dema Severin, [name] in Treatment: 206 Wound Status Wound Number: [id number] Primary Lymphedema Etiology: Wound Location: Left Upper Leg Wound Healed - Epithelialized Wounding Event: Blister Status: Date Acquired: 12/27/2019 ComorbidChronic sinus problems/congestion, Comorbid Chronic sinus problems/congestion, Weeks Of Treatment: 1 History: Arrhythmia, Hypertension, Peripheral Arterial Clustered Wound: No Disease, Type II Diabetes, History of Burn, Gout, Confinement Anxiety Photos Wound Measurements Length: (cm) 0 % Reduct Width: (cm) 0 % Reduct Depth: (cm) 0 Epitheli Area: (cm) 0 Volume: (cm) 0 Wound Description Full Thickness Without Exposed Support Classification: Structures Exudate Medium Amount: Exudate Serosanguineous Type: Exudate red, brown Color: Wound Bed Granulation Amount: Medium (34-66%) Granulation Quality: Pink Necrotic Amount: Medium (34-66%) Necrotic Quality: Adherent Slough Foul Odor After Cleansing: No Slough/Fibrino Yes Exposed Structure Fascia Exposed: No Fat Layer (Subcutaneous Tissue) Exposed: Yes Tendon Exposed: No Muscle Exposed: No Joint Exposed: No Bone Exposed: No ion in Area: 100% ion in Volume: 100% alization: None Electronic Signature(s) Signed: 01/05/2020 4:03:04 PM By: Mikeal Hawthorne EMT/HBOT Signed: 01/05/2020 5:43:51 PM By: Deon Pilling Entered By: Mikeal Hawthorne on 01/05/2020 15:01:33 -------------------------------------------------------------------------------- Wound Assessment Details Patient Name: Date  of Service: KOU, GUCCIARDO 01/05/2020 11:00 AM Medical Record [id number] Patient  Account Number: [id number] Date of Birth/Sex: Treating RN: [birth year] (69 y.o. [name] Primary Care Keishawn Rajewski: [name] Other Clinician: Referring Estiven Kohan: Treating Dong Nimmons/Extender:[name], Dema Severin, [name] in Treatment: 206 Wound Status Wound Number: [id number] Primary Lymphedema Etiology: Wound Location: Right Lower Leg - Lateral Wound Open Wounding Event: Blister Status: Date Acquired: 01/03/2020 Comorbid Chronic sinus problems/congestion, Weeks Of Treatment: 0 History: Arrhythmia, Hypertension, Peripheral Arterial Clustered Wound: No Disease, Type II Diabetes, History of Burn, Gout, Confinement Anxiety Photos Wound Measurements Length: (cm) 1.4 % Reduct Width: (cm) 2.5 % Reduct Depth: (cm) 0.1 Epitheli Area: (cm) 2.749 Tunneli Volume: (cm) 0.275 Undermi Wound Description Classification: Full Thickness Without Exposed Support Structures Wound Distinct, outline attached Margin: Exudate Medium Amount: Exudate Serosanguineous Type: Exudate red, brown Color: Wound Bed Granulation Amount: Medium (34-66%) Granulation Quality: Red Necrotic Amount: None Present (0%) Foul Odor After Cleansing: No Slough/Fibrino No Exposed Structure Fascia Exposed: No Fat Layer (Subcutaneous Tissue) Exposed: No Tendon Exposed: No Muscle Exposed: No Joint Exposed: No Bone Exposed: No ion in Area: 0% ion in Volume: 0% alization: None ng: No ning: No Electronic Signature(s) Signed: 01/05/2020 4:03:04 PM By: Mikeal Hawthorne EMT/HBOT Signed: 01/05/2020 5:43:51 PM By: Deon Pilling Entered By: Mikeal Hawthorne on 01/05/2020 15:02:14 -------------------------------------------------------------------------------- Wound Assessment Details Patient Name: Date of Service: [name] 01/05/2020 11:00 AM Medical Record [id number] Patient Account Number: [id number] Date of Birth/Sex: Treating RN: September 29, 1951 (69 y.o. [name] Primary Care Faraaz Wolin:  [name] Other Clinician: Referring Barnard Sharps: Treating Amory Simonetti/Extender:[name], Dema Severin, [name] in Treatment: 206 Wound Status Wound Number: [id number] Primary Diabetic Wound/Ulcer of the Lower Extremity Etiology: Wound Location: Left Toe Fourth Wound Open Wounding Event: Gradually Appeared Status: Date Acquired: 01/05/2020 Comorbid Chronic sinus problems/congestion, Weeks Of Treatment: 0 History: Arrhythmia, Hypertension, Peripheral Arterial Clustered Wound: No Disease, Type II Diabetes, History of Burn, Gout, Confinement Anxiety Photos Wound Measurements Length: (cm) 0.4 % Reduction i Width: (cm) 0.3 % Reduction i Depth: (cm) 0.1 Epithelializa Area: (cm) 0.094 Tunneling: Volume: (cm) 0.009 Undermining: Wound Description Classification: Grade 1 Foul Odor Af Wound Margin: Distinct, outline attached Slough/Fibri Exudate Amount: Small Exudate Type: Serosanguineous Exudate Color: red, brown Wound Bed Granulation Amount: Large (67-100%) Granulation Quality: Pink Fascia Expos Fat Layer (S Tendon Expos Muscle Expos Joint Expose Bone Exposed Electronic Signature(s) Signed: 01/05/2020 4:03:04 PM By: Mikeal Hawthorne EMT/HBOT Signed: 01/05/2020 5:54:02 PM By: Levan Hurst RN, BSN Entered By: Mikeal Hawthorne on 03/03/ ter Cleansing: No no No Exposed Structure ed: No ubcutaneous Tissue) Exposed: No ed: No ed: No d: No : No 2021 15:02:47 n Area: 0% n Volume: 0% tion: Small (1-33%) No No -------------------------------------------------------------------------------- Vitals Details Patient Name: Date of Service: FAITH, BRANAN 01/05/2020 11:00 AM Medical Record [id number] Patient Account Number: [id number] Date of Birth/Sex: Treating RN: 01/03/1951 (69 y.o. [name] Primary Care Shanon Seawright: [name] Other Clinician: Referring Jaiden Dinkins: Treating Chanay Nugent/Extender:[name], Dema Severin, [name] in Treatment: 206 Vital  Signs Time Taken: 11:50 Temperature (F): 97.9 Height (in): 70 Pulse (bpm): 51 Weight (lbs): 380.2 Respiratory Rate (breaths/min): 18 Body Mass Index (BMI): 54.5 Blood Pressure (mmHg): 155/49 Reference Range: 80 - 120 mg / dl Electronic Signature(s) Signed: 01/05/2020 5:43:51 PM By: Deon Pilling Entered By: Deon Pilling on 01/05/2020 12:02:21

## 2020-01-05 NOTE — Progress Notes (Signed)
OFFICE VISIT  01/05/2020   CC:  Chief Complaint  Patient presents with  . Follow-up    [name], pt is fasting   HPI:    Patient is a 69 y.o. Caucasian male who presents for 3 mo f/u DM 2, HTN, CRI with GFR in the 60s. A/P as of last visit: "1) DM 2. Home gluc's not well controlled. Last A1c was decent. A1c and BMET today. May need to bump insulin dosing up (or have him take insulin more regularly-->he often "holds" his dose if gluc <160).  2) HTN: The current medical regimen is effective;  continue present plan and medications. Up today but has not taken meds today + home monitoring consistently normal. Lytes/cr today.  3) CRI III: avoids NSAIDs. His fluid intake is a little low due (has CHF) so if BUN/Cr up we'll have him increase this some.  4) chronic LL lymphedema and recurrent cellulitis.  Wound care center mgmt, f/u today, continue doxycycline as per their latest rx and will give his monthly penicillin injection today.  5) Preventative health: Will send Tdap rx to pharmacy and we'll resend the shingrix rx that pharmacy states they never received.  6) Chronic pain syndrome.  The current medical regimen is effective;  continue present plan and medications. I did electronic rx's for vicodin 5/325, 1-2 bid prn, #60 today for each of the next 3 mo.  Appropriate fill on/after date was noted on each rx. CSC UTD."  Interim hx: Additionally, he has chronic bilat LL cellulitis as a complication of his severe lymphedema and recurrent stasis ulcers. He gets routine management at the wound care clinic and infectious dz. He gets pen G injection one a month here and is due today for this. He also has chronic a-fib and takes xarelto, diltiazem. Has chronic combined systolic and diastolic HF--takes 80mg  lasix bid.  Doing fine. Not seeing much signif change with going to lymphedema clinic [place]. Has not had his meds yet today.  Usual home bp <130/80.  No palp's or heart racing.  No  dizziness.  He takes 40mg  lasix bid, had too much urination and was incapacitate and could not leave his house due to this. Feeling better on this regimen.  Lower ext swelling inc but feels like lymphedema clinic helps sufficiently with this.  Vicodin 5/325, takes 1 every night to help dec pain and help sleep.  Requests RF for butalbital sparingly, due for repeat.  These work very well.  PMP AWARE reviewed today: most recent rx for vicodin  was filled 12/13/19,, # 73, rx by me Most recent rx butalb filled 07/07/19, #60, by me. No red flags.   DM: 75/25 insulin, 25 U qAM and 20 U qPM---pt has latitude to adjust depending on glucose levels. Rare mild episode of symptomatic hypoglycemia.  Checking gluc bid. Fasting gluc avg 165, 2H PP 200 or lower.  ROS: no fevers, no CP, no SOB, no wheezing, no cough, no dizziness, no HAs, no rashes, no melena/hematochezia.  No polyuria or polydipsia.  No myalgias or arthralgias.  Past Medical History:  Diagnosis Date  . ALLERGIC RHINITIS 08/11/2006  . ASTHMA 08/11/2006  . Chronic atrial fibrillation ([place]) 08/2014  . Chronic combined systolic and diastolic CHF (congestive heart failure) [facility])    Cardiology f/u 12/2017: pt volume overloaded (R heart dysf suspected), BNP very high, lasix increased.  Repeat echo 12/2017: normal LV EF, mild DD, +RV syst dysfxn, mod pulm HTN, biatrial enlgmt.  . Chronic constipation   .  Chronic renal insufficiency, stage 2 (mild)    Borderline stage III (GFR 60s).  . DIABETES MELLITUS, TYPE II 08/11/2006  . DM W/RENAL MANIFESTATIONS, TYPE II 04/20/2007  . HYPERTENSION 08/11/2006  . MRSA infection 06/2018   LL venous stasis ulcer infected  . Normocytic anemia 2016-2019   03/2018 B12 normal, iron ok (ferritin borderline low).  Plan repeat CBC at f/u summer 2019 and if decreased from baseline will check hemoccults and iron labs again.  . OBESITY, MORBID 12/14/2007  . OSA (obstructive sleep apnea)    to get sleep study with Pulmonary  [facility]) as of 12/03/2018 consult.  . 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  [id number] ID started him on amoxil prophylaxis---ineffective.  End 2018/Jan 2019 penicillin G injections prophyl helpful but pt declined to continue this as of 01/2018 ID f/u.  ID talked him into resuming monthly penicillin G as of 02/2018 f/u.    Marland Kitchen Restless leg syndrome    Rx'd clonazepam 09/2017 and pt refused to take it after reading the medication's potential side effects.  . Venous stasis ulcers of both lower extremities ([facility])    Severe lymphedema.  wound clinic care ongoing as of 01/2018    Past Surgical History:  Procedure Laterality Date  . Carotid dopplers  07/23/2018   Left NORMAL.  Right 1-39% ICA stenosis, with <50% distal CCA stenosis (not hemodynamically significant)  . TRANSTHORACIC ECHOCARDIOGRAM  11/2007; 09/2014; 11/2015;12/2017   LV fxn normal, EF normal, mild dilation of left atrium.  2015 grade II diast dysfxn.  2017 EF 55-60%. 2019: LVEF 60-65%, mild RV syst dysf,biatrial enlgmt, mod pulm htn.  Marland Kitchen URETERAL STENT PLACEMENT    . virtual colonoscopy  01/2011   Normal    Outpatient Medications Prior to Visit  Medication Sig Dispense Refill  . arginine 500 MG tablet Take by mouth.    Marland Kitchen b complex vitamins capsule Take 1 capsule by mouth every morning.    . cetirizine (ZYRTEC) 10 MG tablet Take 10 mg by mouth daily.    . Coenzyme Q10 (CO Q 10) 10 MG CAPS Take by mouth. Reported on 02/19/2016    . diltiazem (TIAZAC) 240 MG 24 hr capsule TAKE 1 CAPSULE BY MOUTH  DAILY 90 capsule 0  . doxycycline (MONODOX) 100 MG capsule Take 100 mg by mouth 2 (two) times daily.    Marland Kitchen doxycycline (VIBRAMYCIN) 100 MG capsule Take 100 mg by mouth 2 (two) times daily.    . furosemide (LASIX) 40 MG tablet TAKE 2 TABLETS BY MOUTH TWO TIMES DAILY 360 tablet 1  . Glutamine 500 MG CAPS Take by mouth.    Marland Kitchen HUMALOG MIX 75/25 KWIKPEN (75-25) 100 UNIT/ML Kwikpen  INJECT SUBCUTANEOUSLY 25  UNITS EVERY MORNING AND 20  UNITS EVERY EVENING AT  SUPPER 45 mL 3  . HYDROcodone-acetaminophen (NORCO/VICODIN) 5-325 MG tablet 1-2 tabs po bid prn pain 60 tablet 0  . Insulin Pen Needle (B-D ULTRAFINE III SHORT PEN) 31G X 8 MM MISC USE 1  TWICE DAILY 100 each 0  . Insulin Pen Needle (BD ULTRA-FINE PEN NEEDLES) 29G X 12.7MM MISC 1 each by Other route 2 (two) times daily. 200 each 2  . Insulin Pen Needle (BD ULTRA-FINE PEN NEEDLES) 29G X 12.7MM MISC USE TWICE DAILY 200 each 3  . lisinopril (ZESTRIL) 40 MG tablet TAKE 1 TABLET BY MOUTH  DAILY 90 tablet 0  . metFORMIN (GLUCOPHAGE) 1000 MG tablet TAKE 1  TABLET BY MOUTH  TWICE DAILY WITH MEALS 180 tablet 0  . metoprolol tartrate (LOPRESSOR) 50 MG tablet TAKE 1 AND 1/2 TABLETS BY  MOUTH TWO TIMES DAILY 270 tablet 0  . Multiple Vitamin (MULTIVITAMIN) tablet Take 1 tablet by mouth daily.    [name] ULTRA test strip CHECK BLOOD SUGAR TWO TIMES DAILY 200 strip 3  . rivaroxaban (XARELTO) 20 MG TABS tablet TAKE 1 TABLET BY MOUTH ONCE DAILY WITH SUPPER 90 tablet 3  . saw palmetto 500 MG capsule Take 500 mg by mouth daily.    Marland Kitchen terazosin (HYTRIN) 10 MG capsule TAKE 1 CAPSULE BY MOUTH  ONCE A DAY AT BEDTIME 90 capsule 3  . triamcinolone ointment (KENALOG) 0.1 % APPLY OINTMENT WITH EACH DRESSING CHANGE TO LEGS AS DIRECTED    . vitamin E 400 UNIT capsule Take 400 Units by mouth every morning. Selenium 50mg     . butalbital-acetaminophen-caffeine (FIORICET WITH CODEINE) 50-325-40-30 MG capsule TAKE 1 TO 2 CAPSULES BY  MOUTH EVERY 6 HOURS AS  NEEDED FOR HEADACHE(S)  MANUFACTURER RECOMMENDS NOT EXCEEDING 6 CAPSULES/DAY 30 capsule 0   Facility-Administered Medications Prior to Visit  Medication Dose Route Frequency Provider Last Rate Last Admin  . penicillin g benzathine (BICILLIN LA) 1200000 UNIT/2ML injection 600,000 Units  600,000 Units Intramuscular Q30 days , MD   600,000 Units at 01/05/20 0911  . penicillin g  benzathine (BICILLIN LA) 1200000 UNIT/2ML injection 600,000 Units  600,000 Units Intramuscular Q30 days 03/06/20, MD   600,000 Units at 01/05/20 0910  . penicillin g benzathine (BICILLIN LA) 1200000 UNIT/2ML injection 600,000 Units  600,000 Units Intramuscular Q30 days 03/06/20, MD   600,000 Units at 12/08/19 [phone]  . penicillin g benzathine (BICILLIN LA) 1200000 UNIT/2ML injection 600,000 Units  600,000 Units Intramuscular Q30 days 8938, MD   600,000 Units at 12/08/19 02/05/20    Allergies  Allergen Reactions  . Other Other (See Comments)    Sneezing, coughing    ROS As per HPI  PE: Blood pressure (!) 145/72, pulse (!) 55, temperature 98.2 F (36.8 C), temperature source Temporal, resp. rate 16, height 5\' 10"  (1.778 m), weight (!) 391 lb 3.2 oz (177.4 kg), SpO2 96 %. Body mass index is 56.13 kg/m.  Gen: Alert, well appearing.  Patient is oriented to person, place, time, and situation. AFFECT: pleasant, lucid thought and speech. Sitting on rollator. CV: RRR, no m/r/g. Chest is clear, no wheezing or rales. Normal symmetric air entry throughout both lung fields. No chest wall deformities or tenderness. EXT: unna boots on bilat.  LABS:  Lab Results  Component Value Date   TSH 4.40 06/08/2018   Lab Results  Component Value Date   WBC 6.1 07/06/2019   HGB 13.1 07/06/2019   HCT 40.3 07/06/2019   MCV 84.3 07/06/2019   PLT 210.0 07/06/2019   Lab Results  Component Value Date   CREATININE 1.37 10/06/2019   BUN 17 10/06/2019   NA 136 10/06/2019   K 4.5 10/06/2019   CL 98 10/06/2019   CO2 27 10/06/2019   Lab Results  Component Value Date   ALT 10 07/06/2019   AST 17 07/06/2019   ALKPHOS 76 07/06/2019   BILITOT 1.0 07/06/2019   Lab Results  Component Value Date   CHOL 106 07/06/2019   Lab Results  Component Value Date   HDL 40.20 07/06/2019   Lab Results  Component Value Date   LDLCALC 55 07/06/2019   Lab  Results  Component Value Date    TRIG 55.0 07/06/2019   Lab Results  Component Value Date   CHOLHDL 3 07/06/2019   Lab Results  Component Value Date   PSA 0.71 07/06/2019   PSA 0.26 03/09/2018   PSA 0.30 12/09/2016   Lab Results  Component Value Date   HGBA1C 7.2 (H) 10/06/2019    IMPRESSION AND PLAN:  1) DM, not well controlled based on home glucoses. Hba1c check today and we'll see what changes need to be made. Of note, we discussed benefits of statin use for high risk pt's like him, showing benefit even in pt's with normal cholesterol levels (Heart Protection Stud).  He declined to add statin at this time.  2) HTN; The current medical regimen is effective;  continue present plan and medications. Lytes/cr today.  3) CRI III: avoids NSAIDs.  Flud balance is fair. Lytes/cr today.  4) A-fib: regular rhythm today.  Doing well on xarelto. CBC monitoring today.  5) Migraine HA syndrome: responds well to fioricet used sparingly over the long term. RF'd this rx today.  6) Chronic pain syndrome: back. The current medical regimen is effective;  continue present plan and medications. No new rx for this today.  7) LE venous insufficiency edema + lymphedema, hx of chronic cellulitis: Continue with wound care clinic, lymphedema clinic, and today we gave his monthly penicillin G injection as per ID clinic rec's.  An After Visit Summary was printed and given to the patient.  FOLLOW UP: Return in about 3 months (around 04/06/2020) for routine chronic illness f/u.  Signed:  Santiago Bumpers, MD           01/05/2020

## 2020-01-05 NOTE — Progress Notes (Addendum)
[name] ([id number]) Visit Report for 01/05/2020 Chief Complaint Document Details Patient Name: Date of Service: [name], [name] 01/05/2020 11:00 AM Medical Record [id number] Patient Account Number: [id number] Date of Birth/Sex: Treating RN: [birth year] (69 y.o. [name] Primary Care Provider: Nicoletta Ba Other Clinician: Referring Provider: Treating Provider/Extender:[name], [name], [name] in Treatment: 206 Information Obtained from: Patient Chief Complaint patient is here for evaluation venous/lymphedema weeping Electronic Signature(s) Signed: 01/05/2020 12:02:00 PM By: Lenda Kelp PA-C Entered By: Lenda Kelp on 01/05/2020 12:02:00 -------------------------------------------------------------------------------- HPI Details Patient Name: Date of Service: [name] 01/05/2020 11:00 AM Medical Record [id number] Patient Account Number: [id number] Date of Birth/Sex: Treating RN: Jan 31, 1951 (69 y.o. [name] Primary Care Provider: Nicoletta Ba Other Clinician: Referring Provider: Treating Provider/Extender:[name], [name], [name] in Treatment: 206 History of Present Illness HPI Description: Referred by PCP for consultation. Patient has long standing history of BLE venous stasis, no prior ulcerations. At beginning of month, developed cellulitis and weeping. Received IM Rocephin followed by Keflex and resolved. Wears compression stocking, appr 6 months old. Not sure strength. No present drainage. 01/22/16 this is a patient who is a type II diabetic on insulin. 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 He tells me he religiously wears pressure stockings of uncertain strength. He was here with weeping edema about 8 months ago but did not have an open wound. Roughly a month ago he had a reopening on his bilateral legs. He is been using bandages and Neosporin. He does not  complain of pain. 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 Last BUN/creatinine I have is from 11/20/15 at 13 and 1.0 respectively 01/29/16; patient arrives today having tolerated the Profore wrap. He brought in his stockings and these are 18 mmHg stockings he bought from [place]. The compression here is likely inadequate. He does not complain of pain or excessive drainage she has no systemic symptoms. The wound on the right looks improved as does the one on the left although one on the left is more substantial with still tissue at risk below the actual wound area on the bilateral posterior calf 02/05/16; patient arrives with poor edema control. He states that we did put a 4 layer compression on it last week. No weight appear 5 this. 02/12/16; the area on the posterior right Has healed. The left Has a substantial wound that has necrotic surface eschar that requires a debridement with a curette. 02/16/16;the patient called or a Nurse visit secondary to increased swelling. He had been in earlier in the week with his right leg healed. He was transitioned to is on pressure stocking on the right leg with the only open wound on the left, a substantial area on the left posterior calf. Note he has a history of severe lower extremity edema, he has a history of chronic atrial fibrillation but not heart failure per my notes but I'll need to research this. He is not complaining of chest pain shortness of breath or orthopnea. The intake nurse noted blisters on the previously closed right leg 02/19/16; this is the patient's regular visit day. I see him on Friday with escalating edema new wounds on the right leg and clear signs of at least right ventricular heart failure. I increased his Lasix to 40 twice a day. He is returning currently in follow-up. States he  is noticed a decrease in that the edema 02/26/16  patient's legs have much less edema. There is nothing really open on the right leg. The left leg has improved condition of the large superficial wound on the posterior left leg 03/04/16; edema control is very much better. The patient's right leg wounds have healed. On the left leg he continues to have severe venous inflammation on the posterior aspect of the left leg. There is no tenderness and I don't think any of this is cellulitis. 03/11/16; patient's right leg is married healed and he is in his own stocking. The patient's left leg has deteriorated somewhat. There is a lot of erythema around the wound on the posterior left leg. There is also a significant rim of erythema posteriorly just above where the wrap would've ended there is a new wound in this location and a lot of tenderness. Can't rule out cellulitis in this area. 03/15/16; patient's right leg remains healed and he is in his own stocking. The patient's left leg is much better than last review. His major wound on the posterior aspect of his left Is almost fully epithelialized. He has 3 small injuries from the wraps. Really. Erythema seems a lot better on antibiotics 03/18/16; right leg remains healed and he is in his own stocking. The patient's left leg is much better. The area on the posterior aspect of the left calf is fully epithelialized. His 3 small injuries which were wrap injuries on the left are improved only one seems still open his erythema has resolved 03/25/16; patient's right leg remains healed and he is in his own stocking. There is no open area today on the left leg posterior leg is completely closed up. His wrap injuries at the superior aspect of his leg are also resolved. He looks as though he has some irritation on the dorsal ankle but this is fully epithelialized without evidence of infection. 03/28/16; we discharged this patient on Monday. Transitioned him into his own stocking. There were problems almost immediately with  uncontrolled swelling weeping edema multiple some of which have opened. He does not feel systemically unwell in particular no chest pain no shortness of breath and he does not feel 04/08/16; the edema is under better control with the Profore light wrap but he still has pitting edema. There is one large wound anteriorly 2 on the medial aspect of his left leg and 3 small areas on the superior posterior calf. Drainage is not excessive he is tolerating a Profore light well 04/15/16; put a Profore wrap on him last week. This is controlled is edema however he had a lot of pain on his left anterior foot most of his wounds are healed 04/22/16 once again the patient has denuded areas on the left anterior foot which he states are because his wrap slips up word. He saw his primary physician today is on Lasix 40 twice a day and states that he his weight is down 20 pounds over the last 3 months. 04/29/16: Much improved. left anterior foot much improved. He is now on Lasix 80 mg per day. Much improved edema control 05/06/16; I was hoping to be able to discharge him today however once again he has blisters at a low level of where the compression was placed last week mostly on his left lateral but also his left medial leg and a small area on the anterior part of the left foot. 05/09/16; apparently the patient went home after his appointment on 7/4 later in  the evening developing pain in his upper medial thigh together with subjective fever and chills although his temperature was not taken. The pain was so intense he felt he would probably have to call 911. However he then remembered that he had leftover doxycycline from a previous round of antibiotics and took these. By the next morning he felt a lot better. He called and spoke to one of our nurses and I approved doxycycline over the phone thinking that this was in relation to the wounds we had previously seen although they were definitely were not. The patient feels a  lot better old fever no chills he is still working. Blood sugars are reasonably controlled 05/13/16; patient is back in for review of his cellulitis on his anterior medial upper thigh. He is taking doxycycline this is a lot better. Culture I did of the nodular area on the dorsal aspect of his foot grew MRSA this also looks a lot better. 05/20/16; the patient is cellulitis on the medial upper thigh has resolved. All of his wound areas including the left anterior foot, areas on the medial aspect of the left calf and the lateral aspect of the calf at all resolved. He has a new blister on the left dorsal foot at the level of the fourth toe this was excised. No evidence of infection 05/27/16; patient continues to complain weeping edema. He has new blisterlike wounds on the left anterior lateral and posterior lateral calf at the top of his wrap levels. The area on his left anterior foot appears better. He is not complaining of fever, pain or pruritus in his feet. 05/30/16; the patient's blisters on his left anterior leg posterior calf all look improved. He did not increase the Lasix 100 mg as I suggested because he was going to run out of his 40 mg tablets. He is still having weeping edema of his toes 06/03/16; I renewed his Lasix at 80 mg once a day as he was about to run out when I last saw him. He is on 80 mg of Lasix now. I have asked him to cut down on the excessive amount of water he was drinking and asked him to drink according to his thirst mechanisms 06/12/2016 -- was seen 2 days ago and was supposed to wear his compression stockings at home but he is developed lymphedema and superficial blisters on the left lower extremity and hence came in for a review 06/24/16; the remaining wound is on his left anterior leg. He still has edema coming from between his toes. There is lymphedema here however his edema is generally better than when I last saw this. He has a history of atrial fibrillation but does  not have a known history of congestive heart failure nevertheless I think he probably has this at least on a diastolic basis. 07/01/16 I reviewed his echocardiogram from January 2017. This was essentially normal. He did not have LVH, EF of 55-60%. His right ventricular function was normal although he did have trivial tricuspid and pulmonic regurgitation. This is not audible on exam however. I increased his Lasix to do massive edema in his legs well above his knees I think in early July. He was also drinking an excessive amount of water at the time. 07/15/16; missed his appointment last week because of the Labor Day holiday on Monday. He could not get another appointment later in the week. Started to feel the wrap digging in superiorly so we remove the top half and the bottom  half of his wrap. He has extensive erythema and blistering superiorly in the left leg. Very tender. Very swollen. Edema in his foot with leaking edema fluid. He has not been systemically unwell 07/22/16; the area on the left leg laterally required some debridement. The medial wounds look more stable. His wrap injury wounds appear to have healed. Edema and his foot is better, weeping edema is also better. He tells me he is meeting with the supplier of the external compression pumps at work 08/05/16; the patient was on vacation last week in [facility]. His wrap is been on for an extended period of time. Also over the weekend he developed an extensive area of tender erythema across his anterior medial thigh. He took to doxycycline yesterday that he had leftover from a previous prescription. The patient complains of weeping edema coming out of his toes 08/08/16; I saw this patient on 10/2. He was tender across his anterior thigh. I put him on doxycycline. He returns today in follow-up. He does not have any open wounds on his lower leg, he still has edema weeping into his toes. 08/12/16; patient was seen back urgently today to  follow-up for his extensive left thigh cellulitis/erysipelas. He comes back with a lot less swelling and erythema pain is much better. I believe I gave him Augmentin and Cipro. His wrap was cut down as he stated a roll down his legs. He developed blistering above the level of the wrap that remained. He has 2 open blisters and 1 intact. 08/19/16; patient is been doing his primary doctor who is increased his Lasix from 40-80 once a day or 80 already has less edema. Cellulitis has remained improved in the left thigh. 2 open areas on the posterior left calf 08/26/16; he returns today having new open blisters on the anterior part of his left leg. He has his compression pumps but is not yet been shown how to use some vital representative from the supplier. 09/02/16 patient returns today with no open wounds on the left leg. Some maceration in his plantar toes 09/10/2016 -- Dr. Leanord Hawking had recently discharged him on 09/02/2016 and he has come right back with redness swelling and some open ulcers on his left lower extremity. He says this was caused by trying to apply his compression stockings and he's been unable to use this and has not been able to use his lymphedema pumps. He had some doxycycline leftover and he has started on this a few days ago. 09/16/16; there are no open wounds on his leg on the left and no evidence of cellulitis. 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 He does not complain of symptoms here. I am not clear use using his external compression pumps. 09/23/16; I have not seen this patient in 2 weeks. He canceled his appointment 10 days ago as he was going on vacation. He tells me that on Monday he noticed a large area on his posterior left leg which is been draining copiously and is reopened into a large wound. He is been using ABDs and the external part of his juxtalite, according to our nurse this was not on  properly. 10/07/16; Still a substantial area on the posterior left leg. Using silver alginate 10/14/16; in general better although there is still open area which looks healthy. Still using silver alginate. He reminds me that this happen before he left for [facility]. Today while he was showering in the morning.  He had been using his juxtalite's 10/21/16; the area on his posterior left leg is fully epithelialized. However he arrives today with a large area of tender erythema in his medial and posterior left thigh just above the knee. I have marked the area. Once again he is reluctant to consider hospitalization. I treated him with oral antibiotics in the past for a similar situation with resolution I think with doxycycline however this area it seems more extensive to me. He is not complaining of fever but does have chills and says states he is thirsty. His blood sugar today was in the 140s at home 10/25/16 the area on his posterior left leg is fully epithelialized although there is still some weeping edema. The large area of tenderness and erythema in his medial and posterior left thigh is a lot less tender although there is still a lot of swelling in this thigh. He states he feels a lot better. He is on doxycycline and Augmentin that I started last week. This will continued until Tuesday, December 26. I have ordered a duplex ultrasound of the left thigh rule out DVT whether there is an abscess something that would need to be drained I would also like to know. 11/01/16; he still has weeping edema from a not fully epithelialized area on his left posterior calf. Most of the rest of this looks a lot better. He has completed his antibiotics. His thigh is a lot better. Duplex ultrasound did not show a DVT in the thigh 11/08/16; he comes in today with more Denuded surface epithelium from the posterior aspect of his calf. There is no real evidence of cellulitis. The superior aspect of his wrap appears to  have put quite an indentation in his leg just below the knee and this may have contributed. He does not complain of pain or fever. We have been using silver alginate as the primary dressing. The area of cellulitis in the right thigh has totally resolved. He has been using his compression stockings once a week 11/15/16; the patient arrives today with more loss of epithelium from the posterior aspect of his left calf. He now has a fairly substantial wound in this area. The reason behind this deterioration isn't exactly clear although his edema is not well controlled. He states he feels he is generally more swollen systemically. He is not complaining of chest pain shortness of breath fever. Tells me he has an appointment with his primary physician in early February. He is on 80 mg of oral Lasix a day. He claims compliance with the external compression pumps. He is not having any pain in his legs similar to what he has with his recurrent cellulitis 11/22/16; the patient arrives a follow-up of his large area on his left lateral calf. This looks somewhat better today. He came in earlier in the week for a dressing change since I saw him a week ago. He is not complaining of any pain no shortness of breath no chest pain 11/28/16; the patient arrives for follow-up of his large area on the left lateral calf this does not look better. In fact it is larger weeping edema. The surface of the wound does not look too bad. We have been using silver alginate although I'm not certain that this is a dressing issue. 12/05/16; again the patient follows up for a large wound on the left lateral and left posterior calf this does not look better. There continues to be weeping edema necrotic surface tissue. More worrisome than  this once again there is erythema below the wound involving the distal Achilles and heel suggestive of cellulitis. He is on his feet working most of the day of this is not going well. We are changing his  dressing twice a week to facilitate the drainage. 12/12/16; not much change in the overall dimensions of the large area on the left posterior calf. This is very inflamed looking. I gave him an. Doxycycline last week does not really seem to have helped. He found the wrap very painful indeed it seems to of dog into his legs superiorly and perhaps around the heel. He came in early today because the drainage had soaked through his dressings. 12/19/16- patient arrives for follow-up evaluation of his left lower extremity ulcers. He states that he is using his lymphedema pumps once daily when there is "no drainage". He admits to not using his lipedema pumps while under current treatment. His blood sugars have been consistently between 150-200. 12/26/16; the patient is not using his compression pumps at home because of the wetness on his feet. I've advised him that I think it's important for him to use this daily. He finds his feet too wet, he can put a plastic bag over his legs while he is in the pumps. Otherwise I think will be in a vicious circle. We are using silver alginate to the major area on his left posterior calf 01/02/17; the patient's posterior left leg has further of all into 3 open wounds. All of them covered with a necrotic surface. He claims to be using his compression pumps once a day. His edema control is marginal. Continue with silver alginate 01/10/17; the patient's left posterior leg actually looks somewhat better. There is less edema, less erythema. Still has 3 open areas covered with a necrotic surface requiring debridement. He claims to be using his compression pumps once a day his edema control is better 01/17/17; the patient's left posterior calf look better last week when I saw him and his wrap was changed 2 days ago. He has noted increasing pain in the left heel and arrives today with much larger wounds extensive erythema extending down into the entire heel area especially tender  medially. He is not systemically unwell CBGs have been controlled no fever. Our intake nurse showed me limegreen drainage on his AVD pads. 01/24/17; his usual this patient responds nicely to antibiotics last week giving him Levaquin for presumed Pseudomonas. The whole entire posterior part of his leg is much better much less inflamed and in the case of his Achilles heel area much less tender. He has also had some epithelialization posteriorly there are still open areas here and still draining but overall considerably better 01/31/17- He has continue to tolerate the compression wraps. he states that he continues to use the lymphedema pumps daily, and can increase to twice daily on the weekends. He is voicing no complaints or concerns regarding his LLE ulcers 02/07/17-he is here for follow-up evaluation. He states that he noted some erythema to the left medial and anterior thigh, which he states is new as of yesterday. He is concerned about recurrent cellulitis. He states his blood sugars have been slightly elevated, this morning in the 180s 02/14/17; he is here for follow-up evaluation. When he was last here there was erythema superiorly from his posterior wound in his anterior thigh. He was prescribed Levaquin however a culture of the wound surface grew MRSA over the phone I changed him to doxycycline on Monday and things  seem to be a lot better. 02/24/17; patient missed his appointment on Friday therefore we changed his nurse visit into a physician visit today. Still using silver alginate on the large area of the posterior left thigh. He isn't new area on the dorsal left second toe 03/03/17; actually better today although he admits he has not used his external compression pumps in the last 2 days or so because of work responsibilities over the weekend. 03/10/17; continued improvement. External compression pumps once a day almost all of his wounds have closed on the posterior left calf. Better edema  control 03/17/17; in general improved. He still has 3 small open areas on the lateral aspect of his left leg however most of the area on the posterior part of his leg is epithelialized. He has better edema control. He has an ABD pad under his stocking on the right anterior lower leg although he did not let us look at that today. 03/24/17; patient arrives back in clinic today with no open areas however there are areas on the posterior left calf and anterior left calf that are less than 100% epithelialized. His edema is well controlled in the left lower leg. There is some pitting edema probably lymphedema in the left upper thigh. He uses compression pumps at home once per day. I tried to get him to do this twice a day although he is very reticent. 04/01/2017 -- for the last 2 days he's had significant redness, tenderness and weeping and came in for an urgent visit today. 04/07/17; patient still has 6 more days of doxycycline. He was seen by Dr. Con Memos last Wednesday for cellulitis involving the posterior aspect, lateral aspect of his Involving his heel. For the most part he is better there is less erythema and less weeping. He has been on his feet for 12 hours 2 over the weekend. Using his compression pumps once a day 04/14/17 arrives today with continued improvement. Only one area on the posterior left calf that is not fully epithelialized. He has intense bilateral venous inflammation associated with his chronic venous insufficiency disease and secondary lymphedema. We have been using silver alginate to the left posterior calf wound In passing he tells [place] today that the right leg but we have not seen in quite some time has an open area on it but he doesn't want [place] to look at this today states he will show this to [place] next week. 04/21/17; there is no open area on his left leg although he still reports some weeping edema. He showed [place] his right leg today which is the first time we've seen this leg in a  long time. He has a large area of open wound on the right leg anteriorly healthy granulation. Quite a bit of swelling in the right leg and some degree of venous inflammation. He told us about the right leg in passing last week but states that deterioration in the right leg really only happened over the weekend 04/28/17; there is no open area on the left leg although there is an irritated part on the posterior which is like a wrap injury. The wound on the right leg which was new from last week at least to [place] is a lot better. 05/05/17; still no open area on the left leg. Patient is using his new compression stocking which seems to be doing a good job of controlling the edema. He states he is using his compression pumps once per day. The right leg still has an  open wound although it is better in terms of surface area. Required debridement. A lot of pain in the posterior right Achilles marked tenderness. Usually this type of presentation this patient gives concern for an active cellulitis 05/12/17; patient arrives today with his major wound from last week on the right lateral leg somewhat better. Still requiring debridement. He was using his compression stocking on the left leg however that is reopened with superficial wounds anteriorly he did not have an open wound on this leg previously. He is still using his juxta light's once daily at night. He cannot find the time to do this in the morning as he has to be at work by 7 AM 05/19/17; right lateral leg wound looks improved. No debridement required. The concerning area is on the left posterior leg which appears to almost have a subcutaneous hemorrhagic component to it. We've been using silver alginate to all the wounds 05/26/17; the right lateral leg wound continues to look improved. However the area on the left posterior calf is a tightly adherent surface. [place] using silver alginate. Because of the weeping edema in his legs there is very little  good alternatives. 06/02/17; the patient left here last week looking quite good. Major wound on the left posterior calf and a small one on the right lateral calf. Both of these look satisfactory. He tells me that by Wednesday he had noted increased pain in the left leg and drainage. He called on Thursday and Friday to get an appointment here but we were blocked. He did not go to urgent care or his primary physician. He thinks he had a fever on Thursday but did not actually take his temperature. He has not been using his compression pumps on the left leg because of pain. I advised him to go to the emergency room today for IV antibiotics for stents of left leg cellulitis but he has refused I have asked him to take 2 days off work to keep his leg elevated and he has refused this as well. In view of this I'm going to call him and Augmentin and doxycycline. 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 However after a detailed discussion regarding the risk benefits and alternatives he refuses to get admitted to the hospital. With no other recourse I will continue him on oral antibiotics as before and hopefully he'll have his infectious disease consultation this week. 06/16/2017 -- the patient was seen today by the nurse practitioner at infectious disease Ms. Dixon. 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 He has also been advise Lotrimin cream locally. she also had wise IV antibiotics for his condition if it flares up 06/23/17; patient arrives today with drainage bilaterally although the  remaining wound on the left posterior calf after cleaning up today "highlighter yellow drainage" did not look too bad. Unfortunately he has had breakdown on the right anterior leg [previously this leg had not been open and he is using a black stocking] he went to see infectious disease and is been put on clindamycin 150 mg daily, I  did not verify the dose although I'm not familiar with using clindamycin in this dosing range, perhaps for prophylaxisoo 06/27/17; I brought this patient back today to follow-up on the wound deterioration on the right lower leg together with surrounding cellulitis. I started him on doxycycline 4 days ago. This area looks better however he comes in today with intense cellulitis on the medial part of his left thigh. This is not have a wound in this area. Extremely tender. We've been using silver alginate to the wounds on the right lower leg left lower leg with bilateral 4 layer compression he is using his external compression pumps once a day 07/04/17; patient's left medial thigh cellulitis looks better. He has not been using his compression pumps as his insert said it was contraindicated with cellulitis. His right leg continues to make improvements all the wounds are still open. We only have one remaining wound on the left posterior calf. Using silver alginate to all open areas. He is on doxycycline which I started a week ago and should be finishing I gave him Augmentin after Thursday's visit for the severe cellulitis on the left medial thigh which fortunately looks better 07/14/17; the patient's left medial thigh cellulitis has resolved. The cellulitis in his right lower calf on the right also looks better. All of his wounds are stable to improved we've been using silver alginate he has completed the antibiotics I have given him. He has clindamycin 150 mg once a day prescribed by infectious disease for prophylaxis, I've advised him to start this now. We have been using  bilateral Unna boots over silver alginate to the wound areas 07/21/17; the patient is been to see infectious disease who noted his recurrent problems with cellulitis. He was not able to tolerate prophylactic clindamycin therefore he is on amoxicillin 500 twice a day. He also had a second daily dose of Lasix added By Dr. Oneta RackMcKeown but he is not taking this. Nor is he being completely compliant with his compression pumps a especially not this week. He has 2 remaining wounds one on the right posterior lateral lower leg and one on the left posterior medial lower leg. 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 The patient has Unna boots bilaterally. 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 He has bilateral Unna boots for compression and silver alginate to his wounds. Arrives today with his legs looking as good as I have seen him in quite some time. Not surprisingly his wounds look better as well with improvement on the right lateral leg venous insufficiency wound and also the left medial leg. He is still using the compression pumps once a day 08/11/17; both legs appear to be doing better wounds on the right lateral and left medial legs look better. Skin on the right leg quite good. He is been using silver alginate as the primary dressing. I'm going to use Anasept gel calcium alginate and maintain all the secondary dressings 08/18/17; the patient continues to actually do quite well. The area on his right lateral leg is just about closed the left medial also looks better although it is still moist in this area. His edema is well controlled we have been using Anasept gel with calcium alginate and the usual secondary dressings, 4 layer compression and once daily  use of his compression  pumps "always been able to manage 09/01/17; the patient continues to do reasonably well in spite of his trip to [place]. The area on the right lateral leg is epithelialized. Left is much better but still open. He has more edema and more chronic erythema on the left leg [venous inflammation] 09/08/17; he arrives today with no open wound on the right lateral leg and decently controlled edema. Unfortunately his left leg is not nearly as in his good situation as last week.he apparently had increasing edema starting on Saturday. He edema soaked through into his foot so used a plastic bag to walk around his home. The area on the medial right leg which was his open area is about the same however he has lost surface epithelium on the left lateral which is new and he has significant pain in the Achilles area of the left foot. He is already on amoxicillin chronically for prophylaxis of cellulitis in the left leg 09/15/17; he is completed a week of doxycycline and the cellulitis in the left posterior leg and Achilles area is as usual improved. He still has a lot of edema and fluid soaking through his dressings. There is no open wound on the right leg. He saw infectious disease NP today 09/22/17;As usual 1 we transition him from our compression wraps to his stockings things did not go well. He has several small open areas on the right leg. He states this was caused by the compression wrap on his skin although he did not wear this with the stockings over them. He has several superficial areas on the left leg medially laterally posteriorly. He does not have any evidence of active cellulitis especially involving the left Achilles The patient is traveling from [facility] Saturday going to [facility]. He states he isn't attempting to get an appointment with a heel objects wound center there to change his dressings. I am not completely certain whether this will work 10/06/17; the patient came in on Friday for a  nurse visit and the nurse reported that his legs actually look quite good. He arrives in clinic today for his regular follow-up visit. He has a new wound on his left third toe over the PIP probably caused by friction with his footwear. He has small areas on the left leg and a very superficial but epithelialized area on the right anterior lateral lower leg. Other than that his legs look as good as I've seen him in quite some time. We have been using silver alginate Review of systems; no chest pain no shortness of breath other than this a 10 point review of systems negative 10/20/17; seen by Dr. Meyer Russel last week. He had taken some antibiotics [doxycycline] that he had left over. Dr. Meyer Russel thought he had candida infection and declined to give him further antibiotics. He has a small wound remaining on the right lateral leg several areas on the left leg including a larger area on the left posterior several left medial and anterior and a small wound on the left lateral. The area on the left dorsal third toe looks a lot better. ROS; Gen.; no fever, respiratory no cough no sputum Cardiac no chest pain other than this 10 point review of system is negative 10/30/17; patient arrives today having fallen in the bathtub 3 days ago. It took him a while to get up. He has pain and maceration in the wounds on his left leg which have deteriorated. He has not been using his pumps  he also has some maceration on the right lateral leg. 11/03/17; patient continues to have weeping edema especially in the left leg. This saturates his dressings which were just put on on 12/27. As usual the doxycycline seems to take care of the cellulitis on his lower leg. He is not complaining of fever, chills, or other systemic symptoms. He states his leg feels a lot better on the doxycycline I gave him empirically. He also apparently gets injections at his primary doctor's officeo Rocephin for cellulitis prophylaxis. I didn't ask him  about his compression pump compliance today I think that's probably marginal. Arrives in the clinic with all of his dressings primary and secondary macerated full of fluid and he has bilateral edema 11/10/17; the patient's right leg looks some better although there is still a cluster of wounds on the right lateral. The left leg is inflamed with almost circumferential skin loss medially to laterally although we are still maintaining anteriorly. He does not have overt cellulitis there is a lot of drainage. He is not using compression pumps. We have been using silver alginate to the wound areas, there are not a lot of options here 11/17/17; the patient's right leg continues to be stable although there is still open wounds, better than last week. The inflammation in the left leg is better. Still loss of surface layer epithelium especially posteriorly. There is no overt cellulitis in the amount of edema and his left leg is really quite good, tells me he is using his compression pumps once a day. 11/24/17; patient's right leg has a small superficial wound laterally this continues to improve. The inflammation in the left leg is still improving however we have continuous surface layer epithelial loss posteriorly. There is no overt cellulitis in the amount of edema in both legs is really quite good. He states he is using his compression pumps on the left leg once a day for 5 out of 7 days 12/01/17; very small superficial areas on the right lateral leg continue to improve. Edema control in both legs is better today. He has continued loss of surface epithelialization and left posterior calf although I think this is better. We have been using silver alginate with large number of absorptive secondary dressings 4 layer on the left Unna boot on the right at his request. He tells me he is using his compression pumps once a day 12/08/17; he has no open area on the right leg is edema control is good here. On the left leg  however he has marked erythema and tenderness breakdown of skin. He has what appears to be a wrap injury just distal to the popliteal fossa. 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 There was swelling erythema and breakdown in the right leg. He has much worse skin breakdown in the left leg as well multiple open areas medially and posteriorly as well as laterally. He tells me he has been using his compression pumps but tells me he feels that the drainage out of his leg is worse when he uses a compression pumps. To be fair to him he is been saying this for a while however I don't know that I have really been listening to this. I wonder if  the compression pumps are working properly 12/22/17;. Once again he arrives with severe erythema, weeping edema from the left greater than right leg. Noncompliance with compression pumps. New this visit he is complaining of pain on the lateral aspect of the right leg and the medial aspect of his right thigh. He apparently saw his cardiologist Dr. Debara Pickett who was ordered an echocardiogram area and I think this is a step in the right direction 12/25/17; started his doxycycline Monday night. There is still intense erythema of the right leg especially in the anterior thigh although there is less tenderness. The erythema around the wound on the right lateral calf also is less tender. He still complaining of pain in the left heel. His wounds are about the same right lateral left medial left lateral. Superficial but certainly not close to closure. He denies being systemically unwell no fever chills no abdominal pain no diarrhea 12/29/17; back in follow-up of his extensive right calf and right thigh  cellulitis. I added amoxicillin to cover possible doxycycline resistant strep. This seems to of done the trick he is in much less pain there is much less erythema and swelling. He has his echocardiogram at 11:00 this morning. X-ray of the left heel was also negative. 01/05/18; the patient arrived with his edema under much better control. Now that he is retired he is able to use his compression pumps daily and sometimes twice a day per the patient. He has a wound on the right leg the lateral wound looks better. Area on the left leg also looks a lot better. He has no evidence of cellulitis in his bilateral thighs I had a quick peak at his echocardiogram. He is in normal ejection fraction and normal left ventricular function. He has moderate pulmonary hypertension moderately reduced right ventricular function. One would have to wonder about chronic sleep apnea although he says he doesn't snore. He'll review the echocardiogram with his cardiologist. 01/12/18; the patient arrives with the edema in both legs under exemplary control. He is using his compression pumps daily and sometimes twice daily. His wound on the right lateral leg is just about closed. He still has some weeping areas on the posterior left calf and lateral left calf although everything is just about closed here as well. I have spoken with Jeri Lager who is the patient's nurse practitioner and infectious disease. She was concerned that the patient had not understood that the parenteral penicillin injections he was receiving for cellulitis prophylaxis was actually benefiting him. I don't think the patient actually saw that I would tend to agree we were certainly dealing with less infections although he had a serious one last month. 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 He is healed. He'll be placed in juxtalite compression wraps and increase his lymphedema pumps to twice daily. We will follow up  again next week to ensure there are no issues with the new regiment. 01/20/18-he is here for evaluation of bilateral lower extremity weeping edema. Yesterday he was placed in compression wrap to the right lower extremity and compression stocking to left lower shrubbery. He states he uses lymphedema pumps last night and again this morning and noted a blister to the left lower extremity. On exam he was noted to have drainage to the right lower extremity. He will be placed in Unna boots bilaterally and follow-up next week 01/26/18; patient was actually discharged a week ago to his own juxta light stockings only to return the next  day with bilateral lower extremity weeping edema.he was placed in bilateral Unna boots. He arrives today with pain in the back of his left leg. There is no open area on the right leg however there is a linear/wrap injury on the left leg and weeping edema on the left leg posteriorly. I spoke with infectious disease about 10 days ago. They were disappointed that the patient elected to discontinue prophylactic intramuscular penicillin shots as they felt it was particularly beneficial in reducing the frequency of his cellulitis. I discussed this with the patient today. He does not share this view. He'll definitely need antibiotics today. Finally he is traveling to [place] and trauma leaving this Saturday and returning a week later and he does not travel with his pumps. He is going by car 01/30/18; patient was seen 4 days ago and brought back in today for review of cellulitis in the left leg posteriorly. I put him on amoxicillin this really hasn't helped as much as I might like. He is also worried because he is traveling to [facility] trauma by car. Finally we will be rewrapping him. There is no open area on the right leg over his left leg has multiple weeping areas as usual 02/09/18; The same wrap on for 10 days. He did not pick up the last doxycycline I prescribed for him. He  apparently took 4 days worth he already had. There is nothing open on his right leg and the edema control is really quite good. He's had damage in the left leg medially and laterally especially probably related to the prolonged use of Unna boots 02/12/18; the patient arrived in clinic today for a nurse visit/wrap change. He complained of a lot of pain in the left posterior calf. He is taking doxycycline that I previously prescribed for him. Unfortunately even though he used his stockings and apparently used to compression pumps twice a day he has weeping edema coming out of the lateral part of his right leg. This is coming from the lower anterior lateral skin area. 02/16/18; the patient has finished his doxycycline and will finish the amoxicillin 2 days. The area of cellulitis in the left calf posteriorly has resolved. He is no longer having any pain. He tells me he is using his compression pumps at least once a day sometimes twice. 02/23/18; the patient finished his doxycycline and Amoxil last week. On Friday he noticed a small erythematous circle about the size of a quarter on the left lower leg just above his ankle. This rapidly expanded and he now has erythema on the lateral and posterior part of the thigh. This is bright red. Also has an area on the dorsal foot just above his toes and a tender area just below the left popliteal fossa. He came off his prophylactic penicillin injections at his own insistence one or 2 months ago. This is obviously deteriorated since then 03/02/18; patient is on doxycycline and Amoxil. Culture I did last week of the weeping area on the back of his left calf grew group B strep. I have therefore renewed the amoxicillin 500 3 times a day for a further week. He has not been systemically unwell. Still complaining of an area of discomfort right under his left popliteal fossa. There is no open wound on the right leg. He tells me that he is using his pumps twice a day on  most days 03/09/18; patient arrives in clinic today completing his amoxicillin today. The cellulitis on his left leg is better. Furthermore  he tells me that he had intramuscular penicillin shots that his primary care office today. However he also states that the wrap on his right leg fell down shortly after leaving clinic last week. He developed a large blister that was present when he came in for a nurse visit later in the week and then he developed intense discomfort around this area.He tells me he is using his compression pumps 03/16/18; the patient has completed his doxycycline. The infectious part of this/cellulitis in the left heel area left popliteal area is a lot better. He has 2 open areas on the right calf. Still areas on the left calf but this is a lot better as well. 03/24/18; the patient arrives complaining of pain in the left popliteal area again. He thinks some of this is wrap injury. He has no open area on the right leg and really no open area on the left calf either except for the popliteal area. He claims to be compliant with the compression pumps 03/31/18; I gave him doxycycline last week because of cellulitis in the left popliteal area. This is a lot better although the surface epithelium is denuded off and response to this. He arrives today with uncontrolled edema in the right calf area as well as a fingernail injury in the right lateral calf. There is only a few open areas on the left 04/06/18; I gave him amoxicillin doxycycline over the last 2 weeks that the amoxicillin should be completing currently. He is not complaining of any pain or systemic symptoms. The only open areas see has is on the right lateral lower leg paradoxically I cannot see anything on the left lower leg. He tells me he is using his compression pumps twice a day on most days. Silver alginate to the wounds that are open under 4 layer compression 04/13/18; he completed antibiotics and has no new complaints. Using  his compression pumps. Silver alginate that anything that's opened 04/20/18; he is using his compression pumps religiously. Silver alginate 4 layer compression anything that's opened. He comes in today with no open wounds on the left leg but 3 on the right including a new one posteriorly. He has 2 on the right lateral and one on the right posterior. He likes Unna boots on the right leg for reasons that aren't really clear we had the usual 4 layer compression on the left. It may be necessary to move to the 4 layer compression on the right however for now I left them in the Unna boots 04/27/18; he is using his compression pumps at least once a day. He has still the wounds on the right lateral calf. The area right posteriorly has closed. He does not have an open wound on the left under 4 layer compression however on the dorsal left foot just proximal to the toes and the left third toe 2 small open areas were identified 05/11/18; he has not uses compression pumps. The areas on the right lateral calf have coalesced into one large wound necrotic surface. On the left side he has one small wound anteriorly however the edema is now weeping out of a large part of his left leg. He says he wasn't using his pumps because of the weeping fluid. I explained to him that this is the time he needs to pump more 05/18/18; patient states he is using his compression pumps twice a day. The area on the right lateral large wound albeit superficial. On the left side he has innumerable number of  small new wounds on the left calf particularly laterally but several anteriorly and medially. All these appear to have healthy granulated base these look like the remnants of blisters however they occurred under compression. The patient arrives in clinic today with his legs somewhat better. There is certainly less edema, less multiple open areas on the left calf and the right anterior leg looks somewhat better as well superficial and a  little smaller. However he relates pain and erythema over the last 3-4 days in the thigh and I looked at this today. He has not been systemically unwell no fever no chills no change in blood sugar values 05/25/18; comes in today in a better state. The severe cellulitis on his left leg seems better with the Keflex. Not as tender. He has not been systemically unwell Hard to find an open wound on the left lower leg using his compression pumps twice a day The confluent wounds on his right lateral calf somewhat better looking. These will ultimately need debridement I didn't do this today. 06/01/18; the severe cellulitis on the left anterior thigh has resolved and he is completed his Keflex. There is no open wound on the left leg however there is a superficial excoriation at the base of the third toe dorsally. Skin on the bottom of his left foot is macerated looking. The left the wounds on the lateral right leg actually looks some better although he did require debridement of the top half of this wound area with an open curet 06/09/18 on evaluation today patient appears to be doing poorly in regard to his right lower extremity in particular this appears to likely be infected he has very thick purulent discharge along with a bright green tent to the discharge. This makes me concerned about the possibility of pseudomonas. He's also having increased discomfort at this point on evaluation. Fortunately there does not appear to be any evidence of infection spreading to the other location at this time. 06/16/18 on evaluation today patient appears to actually be doing fairly well. His ulcer has actually diminished in size quite significantly at this point which is good news. Nonetheless he still does have some evidence of infection he did see infectious disease this morning before coming here for his appointment. I did review the results of their evaluation and their note today. They did actually have him  discontinue the Cipro and initiate treatment with linezolid at this time. He is doing this for the next seven days and they recommended a follow-up in four months with them. He is the keep a log of the need for intermittent antibiotic therapy between now and when he falls back up with infectious disease. This will help them gaze what exactly they need to do to try and help them out. 06/23/18; the patient arrives today with no open wounds on the left leg and left third toe healed. He is been using his compression pumps twice a day. On the right lateral leg he still has a sizable wound but this is a lot better than last time I saw this. 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 Has been using silver alginate under 4 layer compression 06/30/18; the only open wound he has is on the right lateral leg and this looks healthy. No debridement is required. We have been using silver alginate. He does not have an open wound on the left leg. There  is apparently some drainage from the dorsal proximal third toe on the left although I see no open wound here. 07/03/18 on evaluation today patient was actually here just for a nurse visit rapid change. However when he was here on Wednesday for his rat change due to having been healed on the left and then developing blisters we initiated the wrap again knowing that he would be back today for us to reevaluate and see were at. Unfortunately he has developed some cellulitis into the proximal portion of his right lower extremity even into the region of his thigh. 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 He was placed on one as what at that point. Nonetheless he is done with that and has been tolerating it well otherwise. Doxycycline which in the past really did not seem to be effective for him. Nonetheless I think the best  option may be for us to definitely reinitiate the antibiotics for a longer period of time. 07/07/18; since I last saw this patient a week ago he has had a difficult time. At that point he did not have an open wound on his left leg. We transitioned him into juxta light stockings. He was apparently in the clinic the next day with blisters on the left lateral and left medial lower calf. He also had weeping edema fluid. He was put back into a compression wrap. He was also in the clinic on Friday with intense erythema in his right thigh. Per the patient he was started on Bactrim however that didn't work at all in terms of relieving his pain and swelling. He has taken 3 doxycycline that he had left over from last time and that seems to of helped. He has blistering on the right thigh as well. 07/14/18; the erythema on his right thigh has gotten better with doxycycline that he is finishing. 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 Presumably this cellulitis in the thigh was not related to that although I think this is a bit concerning going forward. He still has an area on the right lateral calf the blister on the right medial calf just below the knee that was discussed above. On the left 2 small open areas left medial and left lateral. Edema control is adequate. He is using his compression pumps twice a day 07/20/18; continued improvement in the condition of both legs especially the edema in his bilateral thighs. He tells me he is been losing weight through a combination of diet and exercise. He is using his compression pumps twice a day. So overall she made to the remaining wounds 07/27/2018; continued improvement in condition of both legs. His edema is well controlled. The area on the right lateral leg is just about closed he had one blisters show up on the medial left upper calf. We have him in 4 layer compression. He is going on  a 10-day trip to [place], [place] and [place]. He will be driving. He wants to wear Unna boots because of the lessening amount of constriction. He will not use compression pumps while he is away 08/05/18 on evaluation today patient actually appears to be doing decently well all things considered in regard to his bilateral lower extremities. The worst ulcer is actually only posterior aspect of his left lower extremity with a four layer compression wrap cut into his leg a couple weeks  back. He did have a trip and actually had [employer] for the trip that he is worn since he was last here. Nonetheless he feels like the [employer] actually do better for him his swelling is up a little bit but he also with his trip was not taking his Lasix on a regular set schedule like he was supposed to be. He states that obviously the reason being that he cannot drive and keep going without having to urinate too frequently which makes it difficult. He did not have his pumps with him while he was away either which I think also maybe playing a role here too. 08/13/2018; the patient only has a small open wound on the right lateral calf which is a big improvement in the last month or 2. He also has the area posteriorly just below the posterior fossa on the left which I think was a wrap injury from several weeks ago. He has no current evidence of cellulitis. He tells me he is back into his compression pumps twice a day. He also tells me that while he was at the [place] somebody stole a section of his extremitease stockings 08/20/2018; back in the clinic with a much improved state. He only has small areas on the right lateral mid calf which is just about healed. This was is more substantial area for quite a prolonged period of time. He has a small open area on the left anterior tibia. The area on the posterior calf just below the popliteal fossa is closed today. He is using his compression pumps twice a  day 08/28/2018; patient has no open wound on the right leg. He has a smattering of open areas on the calf with some weeping lymphedema. More problematically than that it looks as though his wraps of slipped down in his usual he has very angry upper area of edema just below the right medial knee and on the right lateral calf. He has no open area on his feet. The patient is traveling to [facility] next week. I will send him in an antibiotic. We will continue to wrap the right leg. We ordered extremitease stockings for him last week and I plan to transition the right leg to a stocking when he gets home which will be in 10 days time. As usual he is very reluctant to take his pumps with him when he travels 09/07/2018; patient returns from [facility]. He shows me a picture of his left leg in the mid part of his trip last week with intense fire engine erythema. The picture look bad enough I would have considered sending him to the hospital. Instead he went to the wound care center in [facility]. They did not prescribe him antibiotics but he did take some doxycycline he had leftover from a previous visit. I had given him trimethoprim sulfamethoxazole before he left this did not work according to the patient. This is resulted in some improvement fortunately. He comes back with a large wound on the left posterior calf. Smaller area on the left anterior tibia. Denuded blisters on the dorsal left foot over his toes. Does not have much in the way of wounds on the right leg although he does have a very tender area on the right posterior area just below the popliteal fossa also suggestive of infection. He promises me he is back on his pumps twice a day 09/15/2018; the intense cellulitis in his left lower calf is a lot better.  The wound area on the posterior left calf is also so better. However he has reasonably extensive wounds on the dorsal aspect of his second and third toes and the proximal  foot just at the base of the toes. There is nothing open on the right leg 09/22/2018; the patient has excellent edema control in his legs bilaterally. He is using his external compression pumps twice a day. He has no open area on the right leg and only the areas in the left foot dorsally second and third toe area on the left side. He does not have any signs of active cellulitis. 10/06/2018; the patient has good edema control bilaterally. He has no open wound on the right leg. There is a blister in the posterior aspect of his left calf that we had to deal with today. He is using his compression pumps twice a day. There is no signs of active cellulitis. We have been using silver alginate to the wound areas. He still has vulnerable areas on the base of his left first second toes dorsally He has a his extremities stockings and we are going to transition him today into the stocking on the right leg. He is cautioned that he will need to continue to use the compression pumps twice a day. If he notices uncontrolled edema in the right leg he may need to go to 3 times a day. 10/13/2018; the patient came in for a nurse check on Friday he has a large flaccid blister on the right medial calf just below the knee. We unroofed this. He has this and a new area underneath the posterior mid calf which was undoubtedly a blister as well. He also has several small areas on the right which is the area we put his extremities stocking on. 10/19/2018; the patient went to see infectious disease this morning I am not sure if that was a routine follow-up in any case the doxycycline I had given him was discontinued and started on linezolid. He has not started this. It is easy to look at his left calf and the inflammation and think this is cellulitis however he is very tender in the tissue just below the popliteal fossa and I have no doubt that there is infection going on here. He states the problem he is having is that with the  compression pumps the edema goes down and then starts walking the wrap falls down. We will see if we can adhere this. He has 1 or 2 minuscule open areas on the right still areas that are weeping on the posterior left calf, the base of his left second and third toes 10/26/18; back today in clinic with quite of skin breakdown in his left anterior leg. This may have been infection the area below the popliteal fossa seems a lot better however tremendous epithelial loss on the left anterior mid tibia area over quite inexpensive tissue. He has 2 blisters on the right side but no other open wound here. 10/29/2018; came in urgently to see [place] today and we worked him in for review. He states that the 4 layer compression on the right leg caused pain he had to cut it down to roughly his mid calf this caused swelling above the wrap and he has blisters and skin breakdown today. As a result of the pain he has not been using his pumps. Both legs are a lot more edematous and there is a lot of weeping fluid. 11/02/18; arrives in clinic with continued difficulties in  the right leg> left. Leg is swollen and painful. multiple skin blisters and new open areas especially laterally. He has not been using his pumps on the right leg. 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 He is not systemically unwell. 11/09/2018; the patient claims he is being compliant with his pumps. He is finished the doxycycline I gave him last week. Culture I did of the wound on the right lateral leg showed a few very resistant methicillin staph aureus. This was resistant to doxycycline. Nevertheless he states the pain in the leg is a lot better which makes me wonder if the cultured organism was not really what was causing the problem nevertheless this is a very dangerous organism to be culturing out of any wound. His right leg is still a lot larger than the left. He is using an [employer] boot on this area,  he blames a 4-layer compression for causing the original skin breakdown which I doubt is true however I cannot talk him out of it. We have been using silver alginate to all of these areas which were initially blisters 11/16/2018; patient is being compliant with his external compression pumps at twice a day. Miraculously he arrives in clinic today with absolutely no open wounds. He has better edema control on the left where he has been using 4 layer compression versus wound of wounds on the right and I pointed this out to him. There is no inflammation in the skin in his lower legs which is also somewhat unusual for him. There is no open wounds on the dorsal left foot. He has extremitease stockings at home and I have asked him to bring these in next week. 11/25/18 patient's lower extremity on examination today on the left appears for the most part to be wound free. He does have an open wound on the lateral aspect of the right lower extremity but this is minimal compared to what I've seen in past. He does request that we go ahead and wrap the left leg as well even though there's nothing open just so hopefully it will not reopen in short order. 1/28; patient has superficial open wounds on the right lateral calf left anterior calf and left posterior calf. His edema control is adequate. 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 We have been using silver alginate as the primary dressing. He claims compliance with his compression pumps 2/4; patient has superficial open wounds on numerous areas of his left calf and again one on the left dorsal foot. The areas on the right lateral calf have healed. The cellulitis that I gave him doxycycline for last week is also resolved this was mostly on the left anterior calf just below the tibial tuberosity. His edema looks fairly well-controlled. He tells me he went to see his primary  doctor today and had blood work ordered 2/11; once again he has several open areas on the left calf left tibial area. Most of these are small and appear to have healthy granulation. He does not have anything open on the right. The edema and control in his thighs is pretty good which is usually a good indication he has been using his pumps as requested. 2/18; he continues to have several small areas on the left calf and left tibial area. Most of these are small healthy granulation. We put him in his stocking on the right  leg last week and he arrives with a superficial open area over the right upper tibia and a fairly large area on the right lateral tibia in similar condition. His edema control actually does not look too bad, he claims to be using his compression pumps twice a day 2/25. Continued small areas on the left calf and left tibial area. New areas especially on the right are identified just below the tibial tuberosity and on the right upper tibia itself. There are also areas of weeping edema fluid even without an obvious wound. He does not have a considerable degree of lymphedema but clearly there is more edema here than his skin can handle. He states he is using the pumps twice a day. We have an Unna boot on the right and 4 layer compression on the left. 3/3; he continues to have an area on the right lateral calf and right posterior calf just below the popliteal fossa. There is a fair amount of tenderness around the wound on the popliteal fossa but I did not see any evidence of cellulitis, could just be that the wrap came down and rubbed in this area. He does not have an open area on the left leg however there is an area on the left dorsal foot at the base of the third toe We have been using silver alginate to all wound areas 3/10; he did not have an open area on his left leg last time he was here a week ago. Today he arrives with a horizontal wound just below the tibial tuberosity and an  area on the left lateral calf. He has intense erythema and tenderness in this area. The area is on the right lateral calf and right posterior calf better than last week. We have been using silver alginate as usual 3/18 - Patient returns with 3 small open areas on left calf, and 1 small open area on right calf, the skin looks ok with no significant erythema, he continues the UNA boot on right and 4 layer compression on left. The right lateral calf wound is closed , the right posterior is small area. we will continue silver alginate to the areas. 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 He is been tolerating the dressing changes without complication. Fortunately she has made excellent improvement in regard to the overall status of his wounds. Unfortunately every time we cease wrapping him he ends up reopening in causing more significant issues at that point. Again I'm unsure of the best direction to take although I think the lymphedema clinic may be appropriate for him. 02/03/19 on evaluation today patient appears to be doing well in regard to the wounds that we saw him for last week unfortunately he has a new area on the proximal portion of his right medial/posterior lower extremity where the wrap somewhat slowed down and caused swelling and a blister to rub and open. Unfortunately this is the only opening that he has on either leg at this point. 02/17/19 on evaluation today patient's bilateral lower extremities appear to be doing well. He still completely healed in regard to the left lower extremity. In regard to the right lower extremity the area where the wrap and slid down and caused the blister still seems to be slightly open although this is dramatically better than during the  last evaluation two  weeks ago. I'm very pleased with the way this stands overall. 03/03/19 on evaluation today patient appears to be doing well in regard to his right lower extremity in general although he did have a new blister open this does not appear to be showing any evidence of active infection at this time. Fortunately there's No fevers, chills, nausea, or vomiting noted at this time. Overall I feel like he is making good progress it does feel like that the right leg will we perform the [employer] seems to do with a bit better than three layer wrap on the left which slid down on him. We may switch to doing bilateral in the book wraps. 5/4; I have not seen Mr. Metter in quite some time. According to our case manager he did not have an open wound on his left leg last week. He had 1 remaining wound on the right posterior medial calf. He arrives today with multiple openings on the left leg probably were blisters and/or wrap injuries from Unna boots. I do not think the Unna boot's will provide adequate compression on the left. I am also not clear about the frequency he is using the compression pumps. 03/17/19 on evaluation today patient appears to be doing excellent in regard to his lower extremities compared to last week's evaluation apparently. He had gotten significantly worse last week which is unfortunate. The [employer] wrap on the left did not seem to do very well for him at all and in fact it didn't control his swelling significantly enough he had an additional outbreak. Subsequently we go back to the four layer compression wrap on the left. This is good news. At least in that he is doing better and the wound seem to be killing him. He still has not heard anything from the lymphedema clinic. 03/24/19 on evaluation today patient actually appears to be doing much better in regard to his bilateral lower Trinity as compared to last week when I saw him. Fortunately there's no signs of active infection at this time. He  has been tolerating the dressing changes without complication. Overall I'm extremely pleased with the progress and appearance in general. 04/07/19 on evaluation today patient appears to be doing well in regard to his bilateral lower extremities. His swelling is significantly down from where it was previous. With that being said he does have a couple blisters still open at this point but fortunately nothing that seems to be too severe and again the majority of the larger openings has healed at this time. 04/14/19 on evaluation today patient actually appears to be doing quite well in regard to his bilateral lower extremities in fact I'm not even sure there's anything significantly open at this time at any site. Nonetheless he did have some trouble with these wraps where they are somewhat irritating him secondary to the fact that he has noted that the graph wasn't too close down to the end of this foot in a little bit short as well up to his knee. Otherwise things seem to be doing quite well. 04/21/19 upon evaluation today patient's wound bed actually showed evidence of being completely healed in regard to both lower extremities which is excellent news. There does not appear to be any signs of active infection which is also good news. I'm very pleased in this regard. No fevers, chills, nausea, or vomiting noted at this time. 04/28/19 on evaluation today patient appears to be doing a little bit worse in regard  to both lower extremities on the left mainly due to the fact that when he went infection disease the wrap was not wrapped quite high enough he developed a blister above this. On the right he is a small open area of nothing too significant but again this is continuing to give him some trouble he has been were in the Velcro compression that he has at home. 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 He's been tolerating the dressing changes  without complication. Fortunately there's no signs of active infection at this time. No fevers, chills, nausea, or vomiting noted at this time. We have been trying to get an appointment with her lymphedema clinic in [facility] but unfortunately nobody can get them on phone with not been able to even fax information over the patient likewise is not been able to get in touch with them. Overall I'm not sure exactly what's going on here with to reach out again today. 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 Still having a lot of drainage unfortunately. He tells me especially in the left but even on the right. There's no signs of active infection which is good news we've been using so ratcheted up to this point. 05/19/19 on evaluation today patient actually appears to be doing quite well with regard to his left lower extremity which is great news. Fortunately in regard to the right lower extremity has an issues with his wrap and he subsequently did remove this from what I'm understanding. Nonetheless long story short is what he had rewrapped once he removed it subsequently had maggots underneath this wrap whenever he came in for evaluation today. With that being said they were obviously completely cleaned away by the nursing staff. The visit today which is excellent news. However he does appear to potentially have some infection around the right ankle region where the maggots were located as well. He will likely require anabiotic therapy today. 05/26/19 on evaluation today patient actually appears to be doing much better in regard to his bilateral lower extremities. I feel like the infection is under much better control. With that being said there were maggots noted when the wrap was removed yet again today. Again this could have potentially been left over from previous although at this time there does  not appear to be any signs of significant drainage there was obviously on the wrap some drainage as well this contracted gnats or otherwise. Either way I do not see anything that appears to be doing worse in my pinion and in fact I think his drainage has slowed down quite significantly likely mainly due to the fact to his infection being under better control. 06/02/2019 on evaluation today patient actually appears to be doing well with regard to his bilateral lower extremities there is no signs of active infection at this time which is great news. With that being said he does have several open areas more so on the right than the left but nonetheless these are all significantly better than previously noted. 06/09/2019 on evaluation today patient actually appears to be doing well. His wrap stayed up and he did not cause any problems he had more drainage on the right compared to the left but overall I do not see any major issues at this time which is great news. 06/16/2019 on evaluation today patient appears to be  doing excellent with regard to his lower extremities the only area that is open is a new blister that can have opened as of today on the medial ankle on the left. Other than this he really seems to be doing great I see no major issues at this point. 06/23/2019 on evaluation today patient appears to be doing quite well with regard to his bilateral lower extremities. In fact he actually appears to be almost completely healed there is a small area of weeping noted of the right lower extremity just above the ankle. Nonetheless fortunately there is no signs of active infection at this time which is good news. No fevers, chills, nausea, vomiting, or diarrhea. 8/24; the patient arrived for a nurse visit today but complained of very significant pain in the left leg and therefore I was asked to look at this. Noted that he did not have an open area on the left leg last week nevertheless this was wrapped.  The patient states that he is not been able to put his compression pumps on the left leg because of the discomfort. He has not been systemically unwell 06/30/2019 on evaluation today patient unfortunately despite being excellent last week is doing much worse with regard to his left lower extremity today. In fact he had to come in for a nurse on Monday where his left leg had to be rewrapped due to excessive weeping Dr. Leanord Hawking placed him on doxycycline at that point. Fortunately there is no signs of active infection Systemically at this time which is good news. 07/07/2019 in regard to the patient's wounds today he actually seems to be doing well with his right lower extremity there really is nothing open or draining at this point this is great news. Unfortunately the left lower extremity is given him additional trouble at this time. There does not appear to be any signs of active infection nonetheless he does have a lot of edema and swelling noted at this point as well as blistering all of which has led to a much more poor appearing leg at this time compared to where it was 2 weeks ago when it was almost completely healed. Obviously this is a little discouraging for the patient. He is try to contact the lymphedema clinic in [place] he has not been able to get through to them. 07/14/2019 on evaluation today patient actually appears to be doing slightly better with regard to his left lower extremity ulcers. Overall I do feel like at least at the top of the wrap that we have been placing this area has healed quite nicely and looks much better. The remainder of the leg is showing signs of improvement. Unfortunately in the thigh area he still has an open region on the left and again on the right he has been utilizing just a Band-Aid on an area that also opened on the thigh. Again this is an area that were not able to wrap although we did do an Ace wrap to provide some compression that something that  obviously is a little less effective than the compression wraps we have been using on the lower portion of the leg. He does have an appointment with the lymphedema clinic in [facility] on Friday. 07/21/2019 on evaluation today patient appears to be doing better with regard to his lower extremity ulcers. He has been tolerating the dressing changes without complication. Fortunately there is no signs of active infection at this time. No fevers, chills, nausea, vomiting, or diarrhea. I did receive  the paperwork from the physical therapist at the lymphedema clinic in [place]. Subsequently I signed off on that this morning and sent that back to him for further progression with the treatment plan. 07/28/2019 on evaluation today patient appears to be doing very well with regard to his right lower extremity where I do not see any open wounds at this point. Fortunately he is feeling great as far as that is concerned as well. In regard to the left lower extremity he has been having issues with still several areas of weeping and edema although the upper leg is doing better his lower leg still I think is going require the compression wrap at this time. No fevers, chills, nausea, vomiting, or diarrhea. 08/04/2019 on evaluation today patient unfortunately is having new wounds on the right lower extremity. Again we have been using Unna boot wrap on that side. We switched him to using his juxta lite wrap at home. With that being said he tells me he has been using it although his legs extremely swollen and to be honest really does not appear that he has been. I cannot know that for sure however. Nonetheless he has multiple new wounds on the right lower extremity at this time. Obviously we will have to see about getting this rewrapped for him today. 08/11/2019 on evaluation today patient appears to be doing fairly well with regard to his wounds. He has been tolerating the dressing changes including the  compression wraps without complication. He still has a lot of edema in his upper thigh regions bilaterally he is supposed to be seeing the lymphedema clinic on the 15th of this month once his wraps arrive for the upper part of his legs. 08/18/2019 on evaluation today patient appears to be doing well with regard to his bilateral lower extremities at this point. He has been tolerating the dressing changes without complication. Fortunately there is no signs of active infection which is also good news. He does have a couple weeping areas on the first and second toe of the right foot he also has just a small area on the left foot upper leg and a small area on the left lower leg but overall he is doing quite well in my opinion. He is supposed to be getting his wraps shortly in fact tomorrow and then subsequently is seeing the lymphedema clinic next Wednesday on the 21st. Of note he is also leaving on the 25th to go on vacation for a week to the beach. For that reason and since there is some uncertainty about what there can be doing at lymphedema clinic next Wednesday I am get a make an appointment for next Friday here for [place] to see what we need to do for him prior to him leaving for vacation. 10/23; patient arrives in considerable pain predominantly in the upper posterior calf just distal to the popliteal fossa also in the wound anteriorly above the major wound. This is probably cellulitis and he has had this recurrently in the past. He has no open wound on the right side and he has had an Radio broadcast assistant in that area. Finally I note that he has an area on the left posterior calf which by enlarge is mostly epithelialized. This protrudes beyond the borders of the surrounding skin in the setting of dry scaly skin and lymphedema. The patient is leaving for [facility] on Sunday. Per his longstanding pattern, he will not take his compression pumps with him predominantly out of fear that they  will be stolen. He  therefore asked that we put a Unna boot back on the right leg. He will also contact the wound care center in [facility] to see if they can change his dressing in the mid week. 11/3; patient returned from his vacation to [facility]. He was seen on 1 occasion at their wound care center. They did a 2 layer compression system as they did not have our 4-layer wrap. I am not completely certain what they put on the wounds. They did not change the Unna boot on the right. The patient is also seeing a lymphedema specialist physical therapist in [place]. It appears that he has some compression sleeve for his thighs which indeed look quite a bit better than I am used to seeing. He pumps over these with his external compression pumps. 11/10; the patient has a new wound on the right medial thigh otherwise there is no open areas on the right. He has an area on the left leg posteriorly anteriorly and medially and an area over the left second toe. We have been using silver alginate. He thinks the injury on his thigh is secondary to friction from the compression sleeve he has. 11/17; the patient has a new wound on the right medial thigh last week. He thinks this is because he did not have a underlying stocking for his thigh juxta lite apparatus. He now has this. The area is fairly large and somewhat angry but I do not think he has underlying cellulitis. He has a intact blister on the right anterior tibial area. Small wound on the right great toe dorsally Small area on the medial left calf. 11/30; the patient does not have any open areas on his right leg and we did not take his juxta lite stocking off. However he states that on Friday his compression wrap fell down lodging around his upper mid calf area. As usual this creates a lot of problems for him. He called urgently today to be seen for a nurse visit however the nurse visit turned into a provider visit because of extreme erythema and pain in the  left anterior tibia extending laterally and posteriorly. The area that is problematic is extensive 10/06/2019 upon evaluation today patient actually appears to be doing poorly in regard to his left lower extremity. He Dr. Leanord Hawking did place him on doxycycline this past Monday apparently due to the fact that he was doing much worse in regard to this left leg. Fortunately the doxycycline does seem to be helping. Unfortunately we are still having a very difficult time getting his edema under any type of control in order to anticipate discharge at some point. The only way were really able to control his lymphedema really is with compression wraps and that has only even seemingly temporary. He has been seeing a lymphedema clinic they are trying to help in this regard but still this has been somewhat frustrating in general for the patient. 10/13/19 on evaluation today patient appears to be doing excellent with regard to his right lower extremity as far as the wounds are concerned. His swelling is still quite extensive unfortunately. He is still having a lot of drainage from the thigh areas bilaterally which is unfortunate. He's been going to lymphedema clinic but again he still really does not have this edema under control as far as his lower extremities are concern. With regard to his left lower extremity this seems to be improving and I do believe the doxycycline has been  of benefit for him. He is about to complete the doxycycline. 10/20/2019 on evaluation today patient appears to be doing poorly in regard to his bilateral lower extremities. More in the right thigh he has a lot of irritation at this site unfortunately. In regard to the left lower extremity the wrap was not quite as high it appears and does seem to have caused him some trouble as well. Fortunately there is no evidence of systemic infection though he does have some blue-green drainage which has me concerned for the possibility of  Pseudomonas. He tells me he is previously taking Cipro without complications and he really does not care for Levaquin however due to some of the side effects he has. He is not allergic to any medications specifically antibiotics that were aware of. 10/27/2019 on evaluation today patient actually does appear to be for the most part doing better when compared to last week's evaluation. With that being said he still has multiple open wounds over the bilateral lower extremities. He actually forgot to start taking the Cipro and states that he still has the whole bottle. He does have several new blisters on left lower extremity today I think I would recommend he go ahead and take the Cipro based on what I am seeing at this point. 12/30-Patient comes at 1 week visit, 4 layer compression wraps on the left and Unna boot on the right, primary dressing Xtrasorb and silver alginate. Patient is taking his Cipro and has a few more days left probably 5-6, and the legs are doing better. He states he is using his compressions devices which I believe he has 11/10/2019 on evaluation today patient actually appears to be much better than last time I saw him 2 weeks ago. His wounds are significantly improved and overall I am very pleased in this regard. Fortunately there is no signs of active infection at this time. He is just a couple of days away from completing Cipro. Overall his edema is much better he has been using his lymphedema pumps which I think is also helping at this point. 11/17/2019 on evaluation today patient appears to be doing excellent in regard to his wounds in general. His legs are swollen but not nearly as much as they have been in the past. Fortunately he is tolerating the compression wraps without complication. No fevers, chills, nausea, vomiting, or diarrhea. He does have some erythema however in the distal portion of his right lower extremity specifically around the forefoot and toes there is a  little bit of warmth here as well. 11/24/2019 on evaluation today patient appears to be doing well with regard to his right lower extremity I really do not see any open wounds at this point. His left lower extremity does have several open areas and his right medial thigh also is open. Other than this however overall the patient seems to be making good progress and I am very pleased at this point. 12/01/2019 on evaluation today patient appears to be doing poorly at this point in regard to his left lower extremity has several new blisters despite the fact that we have him in compression wraps. In fact he had a 4-layer compression wrap, his upper thigh wrapped from lymphedema clinic, and a juxta light over top of the 4 layer compression wrap the lymphedema clinic applied and despite all this he still develop blisters underneath. Obviously this does have me concerned about the fact that unfortunately despite what we are doing to try to get wounds  healed he continues to have new areas arise I do not think he is ever good to be at the point where he can realistically just use wraps at home to keep things under control. Typically when we heal him it takes about 1-2 days before he is back in the clinic with severe breakdown and blistering of his lower extremities bilaterally. This is happened numerous times in the past. Unfortunately I think that we may need some help as far as overall fluid overload to kind of limit what we are seeing and get things under better control. 12/08/2019 on evaluation today patient presents for follow-up concerning his ongoing bilateral lower extremity edema. Unfortunately he is still having quite a bit of swelling the compression wraps are controlling this to some degree but he did see Dr. [name] his cardiologist I do have that available for review today as far as the appointment was concerned that was on 12/06/2019. Obviously that she has been 2 days ago. The patient states that he  is only been taking the Lasix 80 mg 1 time a day he had told me previously he was taking this twice a day. Nonetheless Dr. [name] recommended this be up to 80 mg 2 times a day for the patient as he did appear to be fluid overloaded. With that being said the patient states he did this yesterday and he was unable to go anywhere or do anything due to the fact that he was constantly having to urinate. Nonetheless I think that this is still good to be something that is important for him as far as trying to get his edema under control at all things that he is going to be able to just expect his wounds to get under control and things to be better without going through at least a period of time where he is trying to stabilize his fluid management in general and I think increasing the Lasix is likely the first step here. It was also mentioned the possibility that the patient may require metolazone. With that being said he wanted to have the patient take Lasix twice a day first and then reevaluating 2 months to see where things stand. 12/15/2019 upon evaluation today patient appears to be doing regard to his legs although his toes are showing some signs of weeping especially on the left at this point to some degree on the right. There does not appear to be any signs of active infection and overall I do feel like the compression wraps are doing well for him but he has not been able to take the Lasix at home and the increased dose that Dr. [name] recommended. He tells me that just not go to be feasible for him. Nonetheless I think in this case he should probably send a message to Dr. [name] in order to discuss options from the standpoint of possible admission to get the fluid off or otherwise going forward. 12/22/2019 upon evaluation today patient appears to be doing fairly well with regard to his lower extremities at this point. In fact he would be doing excellent if it was not for the fact that his right anterior  thigh apparently had an allergic reaction to adhesive tape that he used. The wound itself that we have been monitoring actually appears to be healed. There is a lot of irritation at this point. 12/29/2019 upon evaluation today patient appears to be doing well in regard to his lower extremities. His left medial thigh is open and somewhat draining  today but this is the only region that is open the right has done much better with the treatment utilizing the steroid cream that I prescribed for him last week. Overall I am pleased in that regard. Fortunately there is no signs of active infection at this time. No fevers, chills, nausea, vomiting, or diarrhea. 01/05/2020 upon evaluation today patient appears to be doing more poorly in regard to his right lower extremity at this point upon evaluation today. Unfortunately he continues to have issues in this regard and I think the biggest issue is controlling his edema. This obviously is not very well controlled at this point is been recommended that he use the Lasix twice a day but he has not been able to do that. Unfortunately I think this is leading to an issue where honestly he is not really able to effectively control his edema and therefore the wounds really are not doing significantly better. I do not think that he is going to be able to keep things under good control unless he is able to control his edema much better. I discussed this again in great detail with him today. Electronic Signature(s) Signed: 01/05/2020 1:40:56 PM By: Lenda KelpStone III, Trinisha Paget PA-C Entered By: Lenda KelpStone III, Efstathios Sawin on 01/05/2020 13:40:56 -------------------------------------------------------------------------------- Physical Exam Details Patient Name: Date of Service: [name], [name] 01/05/2020 11:00 AM Medical Record [id number] Patient Account Number: [id number] Date of Birth/Sex: Treating RN: 04/01/1951 (69 y.o. Elizebeth KollerM) Lynch, Shatara Primary Care Provider: Nicoletta BaMCGOWEN, [name] Other  Clinician: Referring Provider: Treating Provider/Extender:[name], Briant CedarHoyt MCGOWEN, [name] in Treatment: 206 Constitutional Obese and well-hydrated in no acute distress. Respiratory normal breathing without difficulty. Psychiatric this patient is able to make decisions and demonstrates good insight into disease process. Alert and Oriented x 3. pleasant and cooperative. Notes Patient's wound bed currently showed signs of good granulation at this time. Unfortunately he does have a couple new wounds on the right and left lower extremities as well as his left toe. Unfortunately I think this is just to be the nature of what happens when he is wrapped he is less likely to get these when he is using his Velcro compression wraps he is more likely to get him obviously to use nothing that would be the worst case scenario. Either way I do not foresee a time when he is going to be able to go without coming to the wound center unless he can somehow get his edema under better control. Electronic Signature(s) Signed: 01/05/2020 1:41:35 PM By: Lenda KelpStone III, Osman Calzadilla PA-C Entered By: Lenda KelpStone III, Jacee Enerson on 01/05/2020 13:41:35 -------------------------------------------------------------------------------- Physician Orders Details Patient Name: Date of Service: [name], Jadrien J. 01/05/2020 11:00 AM Medical Record [id number] Patient Account Number: [id number] Date of Birth/Sex: Treating RN: 08/25/1951 (69 y.o. 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 Dressing Change Frequency Wound #159 Right,Lateral Lower Leg Do not change entire dressing for one week. Wound #160 Left Toe Fourth Do not  change entire dressing for one week. Skin Barriers/Peri-Wound Care Moisturizing lotion - both legs TCA Cream or Ointment - mixed with lotion Wound Cleansing May shower with protection. Primary Wound Dressing Wound #159 Right,Lateral Lower Leg Calcium Alginate with Silver Wound #160 Left Toe Fourth Calcium Alginate with Silver Secondary Dressing Wound #159 Right,Lateral Lower Leg ABD pad Wound #160 Left Toe Fourth Dry Gauze Drawtex Edema Control 4 layer compression: Left lower extremity Unna Boot to Right Lower Extremity Avoid standing for long periods of time Elevate legs to the level of the heart or above for 30 minutes daily and/or when sitting, a frequency of: - throughout the day Exercise regularly Segmental Compressive Device. - [facility] pumps 60 min 2 times per day Other: - [facility] wraps to upper leg per [facility] Electronic Signature(s) Signed: 01/05/2020 4:54:37 PM By: Lenda Kelp PA-C Signed: 01/05/2020 5:54:02 PM By: [name] RN, BSN Entered By: [name] on 01/05/2020 12:44:15 -------------------------------------------------------------------------------- Problem List Details Patient Name: Date of Service: BRENDA, COWHER 01/05/2020 11:00 AM Medical Record [id number] Patient Account Number: [id number] Date of Birth/Sex: Treating RN: 03-Sep-1951 (69 y.o. 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 hypertension (idiopathic) with ulcer 01/22/2016 No Yes and inflammation of bilateral lower extremity I89.0 Lymphedema, not elsewhere classified 01/22/2016 No Yes E11.622 Type 2 diabetes mellitus with other skin ulcer 01/22/2016 No Yes 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, 01/22/2016 No Yes unspecified L03.116 Cellulitis of left lower limb 04/01/2017 No Yes Inactive Problems ICD-10 Code Description Active Date Inactive Date 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 06/30/2017 skin L97.521 Non-pressure chronic ulcer of other part of left foot limited to 04/27/2018 04/27/2018 breakdown of skin L03.115 Cellulitis of right lower limb 12/22/2017 12/22/2017 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 with other specified 06/30/2018 06/30/2018 severity L97.511 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 06/30/2018 06/30/2018 breakdown of skin Resolved Problems Electronic Signature(s) Signed: 01/05/2020 12:01:51 PM By: Lenda Kelp PA-C Entered By: Lenda Kelp on 01/05/2020 12:01:50 -------------------------------------------------------------------------------- Progress Note Details Patient Name: Date of Service: [name], [name] 01/05/2020 11:00 AM Medical Record [id number] Patient Account Number: [id number] Date of Birth/Sex: Treating RN: 1950-12-09 (69 y.o. [name] Primary Care Provider: Nicoletta Ba Other Clinician: Referring Provider: Treating Provider/Extender:[name], [name], [name] in Treatment: 206 Subjective Chief Complaint Information obtained from  Patient patient is here for evaluation venous/lymphedema weeping History of Present Illness (HPI) Referred by PCP for consultation. Patient has long standing history of BLE venous stasis, no prior ulcerations. At beginning of month, developed cellulitis and weeping. Received IM Rocephin followed by Keflex and resolved. Wears compression stocking, appr 6 months old. Not sure strength. No present drainage. 01/22/16 this is a patient who is a type II diabetic on insulin. 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 He tells me he religiously wears pressure stockings of uncertain strength. He was here with weeping edema about 8 months ago but did not have an open wound. Roughly a month ago he had a reopening on his bilateral legs. He is been using bandages and Neosporin.  He does not complain of pain. 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 Last BUN/creatinine I have is from 11/20/15 at 13 and 1.0 respectively 01/29/16; patient arrives today having tolerated the Profore wrap. He brought in his stockings and these are 18 mmHg stockings he bought from [place]. The compression here is likely inadequate. He does not complain of pain or excessive drainage she has no systemic symptoms. The wound on the right looks improved as does the one on the left although one on the left is more substantial with still tissue at risk below the actual wound area on the bilateral posterior calf 02/05/16; patient arrives with poor edema control. He states that we did put a 4 layer compression on it last week. No weight appear 5 this. 02/12/16; the area on the posterior right Has healed. The left Has a substantial wound that has necrotic surface eschar that requires a debridement with a curette. 02/16/16;the patient called or a Nurse visit secondary to increased swelling. He had been in  earlier in the week with his right leg healed. He was transitioned to is on pressure stocking on the right leg with the only open wound on the left, a substantial area on the left posterior calf. Note he has a history of severe lower extremity edema, he has a history of chronic atrial fibrillation but not heart failure per my notes but I'll need to research this. He is not complaining of chest pain shortness of breath or orthopnea. The intake nurse noted blisters on the previously closed right leg 02/19/16; this is the patient's regular visit day. I see him on Friday with escalating edema new wounds on the right leg and clear signs of at least right ventricular heart failure. I increased his Lasix to 40 twice a day. He is returning currently in follow-up. States he is noticed a decrease in that the edema 02/26/16 patient's legs have much less edema. There is nothing really open on the right leg. The left leg has improved condition of the large superficial wound on the posterior left leg 03/04/16; edema control is very much better. The patient's right leg wounds have healed. On the left leg he continues to have severe venous inflammation on the posterior aspect of the left leg. There is no tenderness and I don't think any of this is cellulitis. 03/11/16; patient's right leg is married healed and he is in his own stocking. The patient's left leg has deteriorated somewhat. There is a lot of erythema around the wound on the posterior left leg. There is also a significant rim of erythema posteriorly just above where the wrap would've ended there is a new wound in this location and a lot of tenderness. Can't rule out cellulitis in this area. 03/15/16; patient's right leg remains healed and he is in his own stocking. The patient's left leg is much better than last review. His major wound on the posterior aspect of his left Is almost fully epithelialized. He has 3 small injuries from the wraps. Really. Erythema  seems a lot better on antibiotics 03/18/16; right leg remains healed and he is in his own stocking. The patient's left leg is much better. The area on the posterior aspect of the left calf is fully epithelialized. His 3 small injuries which were wrap injuries on the left are improved only one seems still open his erythema has resolved  03/25/16; patient's right leg remains healed and he is in his own stocking. There is no open area today on the left leg posterior leg is completely closed up. His wrap injuries at the superior aspect of his leg are also resolved. He looks as though he has some irritation on the dorsal ankle but this is fully epithelialized without evidence of infection. 03/28/16; we discharged this patient on Monday. Transitioned him into his own stocking. There were problems almost immediately with uncontrolled swelling weeping edema multiple some of which have opened. He does not feel systemically unwell in particular no chest pain no shortness of breath and he does not feel 04/08/16; the edema is under better control with the Profore light wrap but he still has pitting edema. There is one large wound anteriorly 2 on the medial aspect of his left leg and 3 small areas on the superior posterior calf. Drainage is not excessive he is tolerating a Profore light well 04/15/16; put a Profore wrap on him last week. This is controlled is edema however he had a lot of pain on his left anterior foot most of his wounds are healed 04/22/16 once again the patient has denuded areas on the left anterior foot which he states are because his wrap slips up word. He saw his primary physician today is on Lasix 40 twice a day and states that he his weight is down 20 pounds over the last 3 months. 04/29/16: Much improved. left anterior foot much improved. He is now on Lasix 80 mg per day. Much improved edema control 05/06/16; I was hoping to be able to discharge him today however once again he has blisters at a  low level of where the compression was placed last week mostly on his left lateral but also his left medial leg and a small area on the anterior part of the left foot. 05/09/16; apparently the patient went home after his appointment on 7/4 later in the evening developing pain in his upper medial thigh together with subjective fever and chills although his temperature was not taken. The pain was so intense he felt he would probably have to call 911. However he then remembered that he had leftover doxycycline from a previous round of antibiotics and took these. By the next morning he felt a lot better. He called and spoke to one of our nurses and I approved doxycycline over the phone thinking that this was in relation to the wounds we had previously seen although they were definitely were not. The patient feels a lot better old fever no chills he is still working. Blood sugars are reasonably controlled 05/13/16; patient is back in for review of his cellulitis on his anterior medial upper thigh. He is taking doxycycline this is a lot better. Culture I did of the nodular area on the dorsal aspect of his foot grew MRSA this also looks a lot better. 05/20/16; the patient is cellulitis on the medial upper thigh has resolved. All of his wound areas including the left anterior foot, areas on the medial aspect of the left calf and the lateral aspect of the calf at all resolved. He has a new blister on the left dorsal foot at the level of the fourth toe this was excised. No evidence of infection 05/27/16; patient continues to complain weeping edema. He has new blisterlike wounds on the left anterior lateral and posterior lateral calf at the top of his wrap levels. The area on his left anterior foot appears better.  He is not complaining of fever, pain or pruritus in his feet. 05/30/16; the patient's blisters on his left anterior leg posterior calf all look improved. He did not increase the Lasix 100 mg as I  suggested because he was going to run out of his 40 mg tablets. He is still having weeping edema of his toes 06/03/16; I renewed his Lasix at 80 mg once a day as he was about to run out when I last saw him. He is on 80 mg of Lasix now. I have asked him to cut down on the excessive amount of water he was drinking and asked him to drink according to his thirst mechanisms 06/12/2016 -- was seen 2 days ago and was supposed to wear his compression stockings at home but he is developed lymphedema and superficial blisters on the left lower extremity and hence came in for a review 06/24/16; the remaining wound is on his left anterior leg. He still has edema coming from between his toes. There is lymphedema here however his edema is generally better than when I last saw this. He has a history of atrial fibrillation but does not have a known history of congestive heart failure nevertheless I think he probably has this at least on a diastolic basis. 07/01/16 I reviewed his echocardiogram from January 2017. This was essentially normal. He did not have LVH, EF of 55-60%. His right ventricular function was normal although he did have trivial tricuspid and pulmonic regurgitation. This is not audible on exam however. I increased his Lasix to do massive edema in his legs well above his knees I think in early July. He was also drinking an excessive amount of water at the time. 07/15/16; missed his appointment last week because of the Labor Day holiday on Monday. He could not get another appointment later in the week. Started to feel the wrap digging in superiorly so we remove the top half and the bottom half of his wrap. He has extensive erythema and blistering superiorly in the left leg. Very tender. Very swollen. Edema in his foot with leaking edema fluid. He has not been systemically unwell 07/22/16; the area on the left leg laterally required some debridement. The medial wounds look more stable. His wrap injury  wounds appear to have healed. Edema and his foot is better, weeping edema is also better. He tells me he is meeting with the supplier of the external compression pumps at work 08/05/16; the patient was on vacation last week in [facility]. His wrap is been on for an extended period of time. Also over the weekend he developed an extensive area of tender erythema across his anterior medial thigh. He took to doxycycline yesterday that he had leftover from a previous prescription. The patient complains of weeping edema coming out of his toes 08/08/16; I saw this patient on 10/2. He was tender across his anterior thigh. I put him on doxycycline. He returns today in follow-up. He does not have any open wounds on his lower leg, he still has edema weeping into his toes. 08/12/16; patient was seen back urgently today to follow-up for his extensive left thigh cellulitis/erysipelas. He comes back with a lot less swelling and erythema pain is much better. I believe I gave him Augmentin and Cipro. His wrap was cut down as he stated a roll down his legs. He developed blistering above the level of the wrap that remained. He has 2 open blisters and 1 intact. 08/19/16; patient is been doing  his primary doctor who is increased his Lasix from 40-80 once a day or 80 already has less edema. Cellulitis has remained improved in the left thigh. 2 open areas on the posterior left calf 08/26/16; he returns today having new open blisters on the anterior part of his left leg. He has his compression pumps but is not yet been shown how to use some vital representative from the supplier. 09/02/16 patient returns today with no open wounds on the left leg. Some maceration in his plantar toes 09/10/2016 -- Dr. Leanord Hawking had recently discharged him on 09/02/2016 and he has come right back with redness swelling and some open ulcers on his left lower extremity. He says this was caused by trying to apply his compression stockings and he's  been unable to use this and has not been able to use his lymphedema pumps. He had some doxycycline leftover and he has started on this a few days ago. 09/16/16; there are no open wounds on his leg on the left and no evidence of cellulitis. 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 He does not complain of symptoms here. I am not clear use using his external compression pumps. 09/23/16; I have not seen this patient in 2 weeks. He canceled his appointment 10 days ago as he was going on vacation. He tells me that on Monday he noticed a large area on his posterior left leg which is been draining copiously and is reopened into a large wound. He is been using ABDs and the external part of his juxtalite, according to our nurse this was not on properly. 10/07/16; Still a substantial area on the posterior left leg. Using silver alginate 10/14/16; in general better although there is still open area which looks healthy. Still using silver alginate. He reminds me that this happen before he left for [facility] Today while he was showering in the morning. He had been using his juxtalite's 10/21/16; the area on his posterior left leg is fully epithelialized. However he arrives today with a large area of tender erythema in his medial and posterior left thigh just above the knee. I have marked the area. Once again he is reluctant to consider hospitalization. I treated him with oral antibiotics in the past for a similar situation with resolution I think with doxycycline however this area it seems more extensive to me. He is not complaining of fever but does have chills and says states he is thirsty. His blood sugar today was in the 140s at home 10/25/16 the area on his posterior left leg is fully epithelialized although there is still some weeping edema. The large area of tenderness and erythema in his medial and posterior left thigh is a lot less tender  although there is still a lot of swelling in this thigh. He states he feels a lot better. He is on doxycycline and Augmentin that I started last week. This will continued until Tuesday, December 26. I have ordered a duplex ultrasound of the left thigh rule out DVT whether there is an abscess something that would need to be drained I would also like to know. 11/01/16; he still has weeping edema from a not fully epithelialized area on his left posterior calf. Most of the rest of this looks a lot better. He has completed his antibiotics. His thigh is a lot better. Duplex ultrasound did not show a DVT in the thigh 11/08/16; he comes in today with more  Denuded surface epithelium from the posterior aspect of his calf. There is no real evidence of cellulitis. The superior aspect of his wrap appears to have put quite an indentation in his leg just below the knee and this may have contributed. He does not complain of pain or fever. We have been using silver alginate as the primary dressing. The area of cellulitis in the right thigh has totally resolved. He has been using his compression stockings once a week 11/15/16; the patient arrives today with more loss of epithelium from the posterior aspect of his left calf. He now has a fairly substantial wound in this area. The reason behind this deterioration isn't exactly clear although his edema is not well controlled. He states he feels he is generally more swollen systemically. He is not complaining of chest pain shortness of breath fever. Tells me he has an appointment with his primary physician in early February. He is on 80 mg of oral Lasix a day. He claims compliance with the external compression pumps. He is not having any pain in his legs similar to what he has with his recurrent cellulitis 11/22/16; the patient arrives a follow-up of his large area on his left lateral calf. This looks somewhat better today. He came in earlier in the week for a dressing  change since I saw him a week ago. He is not complaining of any pain no shortness of breath no chest pain 11/28/16; the patient arrives for follow-up of his large area on the left lateral calf this does not look better. In fact it is larger weeping edema. The surface of the wound does not look too bad. We have been using silver alginate although I'm not certain that this is a dressing issue. 12/05/16; again the patient follows up for a large wound on the left lateral and left posterior calf this does not look better. There continues to be weeping edema necrotic surface tissue. More worrisome than this once again there is erythema below the wound involving the distal Achilles and heel suggestive of cellulitis. He is on his feet working most of the day of this is not going well. We are changing his dressing twice a week to facilitate the drainage. 12/12/16; not much change in the overall dimensions of the large area on the left posterior calf. This is very inflamed looking. I gave him an. Doxycycline last week does not really seem to have helped. He found the wrap very painful indeed it seems to of dog into his legs superiorly and perhaps around the heel. He came in early today because the drainage had soaked through his dressings. 12/19/16- patient arrives for follow-up evaluation of his left lower extremity ulcers. He states that he is using his lymphedema pumps once daily when there is "no drainage". He admits to not using his lipedema pumps while under current treatment. His blood sugars have been consistently between 150-200. 12/26/16; the patient is not using his compression pumps at home because of the wetness on his feet. I've advised him that I think it's important for him to use this daily. He finds his feet too wet, he can put a plastic bag over his legs while he is in the pumps. Otherwise I think will be in a vicious circle. We are using silver alginate to the major area on his left posterior  calf 01/02/17; the patient's posterior left leg has further of all into 3 open wounds. All of them covered with a necrotic surface. He claims  to be using his compression pumps once a day. His edema control is marginal. Continue with silver alginate 01/10/17; the patient's left posterior leg actually looks somewhat better. There is less edema, less erythema. Still has 3 open areas covered with a necrotic surface requiring debridement. He claims to be using his compression pumps once a day his edema control is better 01/17/17; the patient's left posterior calf look better last week when I saw him and his wrap was changed 2 days ago. He has noted increasing pain in the left heel and arrives today with much larger wounds extensive erythema extending down into the entire heel area especially tender medially. He is not systemically unwell CBGs have been controlled no fever. Our intake nurse showed me limegreen drainage on his AVD pads. 01/24/17; his usual this patient responds nicely to antibiotics last week giving him Levaquin for presumed Pseudomonas. The whole entire posterior part of his leg is much better much less inflamed and in the case of his Achilles heel area much less tender. He has also had some epithelialization posteriorly there are still open areas here and still draining but overall considerably better 01/31/17- He has continue to tolerate the compression wraps. he states that he continues to use the lymphedema pumps daily, and can increase to twice daily on the weekends. He is voicing no complaints or concerns regarding his LLE ulcers 02/07/17-he is here for follow-up evaluation. He states that he noted some erythema to the left medial and anterior thigh, which he states is new as of yesterday. He is concerned about recurrent cellulitis. He states his blood sugars have been slightly elevated, this morning in the 180s 02/14/17; he is here for follow-up evaluation. When he was last here there  was erythema superiorly from his posterior wound in his anterior thigh. He was prescribed Levaquin however a culture of the wound surface grew MRSA over the phone I changed him to doxycycline on Monday and things seem to be a lot better. 02/24/17; patient missed his appointment on Friday therefore we changed his nurse visit into a physician visit today. Still using silver alginate on the large area of the posterior left thigh. He isn't new area on the dorsal left second toe 03/03/17; actually better today although he admits he has not used his external compression pumps in the last 2 days or so because of work responsibilities over the weekend. 03/10/17; continued improvement. External compression pumps once a day almost all of his wounds have closed on the posterior left calf. Better edema control 03/17/17; in general improved. He still has 3 small open areas on the lateral aspect of his left leg however most of the area on the posterior part of his leg is epithelialized. He has better edema control. He has an ABD pad under his stocking on the right anterior lower leg although he did not let us look at that today. 03/24/17; patient arrives back in clinic today with no open areas however there are areas on the posterior left calf and anterior left calf that are less than 100% epithelialized. His edema is well controlled in the left lower leg. There is some pitting edema probably lymphedema in the left upper thigh. He uses compression pumps at home once per day. I tried to get him to do this twice a day although he is very reticent. 04/01/2017 -- for the last 2 days he's had significant redness, tenderness and weeping and came in for an urgent visit today. 04/07/17; patient still has  6 more days of doxycycline. He was seen by Dr. Meyer Russel last Wednesday for cellulitis involving the posterior aspect, lateral aspect of his Involving his heel. For the most part he is better there is less erythema and less  weeping. He has been on his feet for 12 hours o2 over the weekend. Using his compression pumps once a day 04/14/17 arrives today with continued improvement. Only one area on the posterior left calf that is not fully epithelialized. He has intense bilateral venous inflammation associated with his chronic venous insufficiency disease and secondary lymphedema. We have been using silver alginate to the left posterior calf wound In passing he tells [place] today that the right leg but we have not seen in quite some time has an open area on it but he doesn't want [place] to look at this today states he will show this to [place] next week. 04/21/17; there is no open area on his left leg although he still reports some weeping edema. He showed [place] his right leg today which is the first time we've seen this leg in a long time. He has a large area of open wound on the right leg anteriorly healthy granulation. Quite a bit of swelling in the right leg and some degree of venous inflammation. He told us about the right leg in passing last week but states that deterioration in the right leg really only happened over the weekend 04/28/17; there is no open area on the left leg although there is an irritated part on the posterior which is like a wrap injury. The wound on the right leg which was new from last week at least to [place] is a lot better. 05/05/17; still no open area on the left leg. Patient is using his new compression stocking which seems to be doing a good job of controlling the edema. He states he is using his compression pumps once per day. The right leg still has an open wound although it is better in terms of surface area. Required debridement. A lot of pain in the posterior right Achilles marked tenderness. Usually this type of presentation this patient gives concern for an active cellulitis 05/12/17; patient arrives today with his major wound from last week on the right lateral leg somewhat better. Still requiring  debridement. He was using his compression stocking on the left leg however that is reopened with superficial wounds anteriorly he did not have an open wound on this leg previously. He is still using his juxta light's once daily at night. He cannot find the time to do this in the morning as he has to be at work by 7 AM 05/19/17; right lateral leg wound looks improved. No debridement required. The concerning area is on the left posterior leg which appears to almost have a subcutaneous hemorrhagic component to it. We've been using silver alginate to all the wounds 05/26/17; the right lateral leg wound continues to look improved. However the area on the left posterior calf is a tightly adherent surface. [place] using silver alginate. Because of the weeping edema in his legs there is very little good alternatives. 06/02/17; the patient left here last week looking quite good. Major wound on the left posterior calf and a small one on the right lateral calf. Both of these look satisfactory. He tells me that by Wednesday he had noted increased pain in the left leg and drainage. He called on Thursday and Friday to get an appointment here but we were blocked. He did not  go to urgent care or his primary physician. He thinks he had a fever on Thursday but did not actually take his temperature. He has not been using his compression pumps on the left leg because of pain. I advised him to go to the emergency room today for IV antibiotics for stents of left leg cellulitis but he has refused I have asked him to take 2 days off work to keep his leg elevated and he has refused this as well. In view of this I'm going to call him and Augmentin and doxycycline. 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 However after a detailed discussion regarding the risk benefits and alternatives he refuses to get admitted to the hospital. With no other recourse I will continue him on oral antibiotics as before and hopefully he'll have his infectious disease consultation this week. 06/16/2017 -- the patient was seen today by the nurse practitioner at infectious disease Ms. Dixon. 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 He has also been advise Lotrimin cream locally. she also had wise IV antibiotics for his condition if it flares up 06/23/17; patient arrives today with drainage bilaterally although the remaining wound on the left posterior calf after cleaning up today "highlighter yellow drainage" did not look too bad. Unfortunately he has had breakdown on the right anterior leg [previously this leg had not been open and he is using a black stocking] he went to see infectious disease and is been put on clindamycin 150 mg daily, I did not verify the dose although I'm not familiar with using clindamycin in this dosing range, perhaps for prophylaxisoo 06/27/17; I brought this patient back today to follow-up on the wound deterioration on the right lower leg together with surrounding cellulitis. I started him on doxycycline 4 days ago. This area looks better however he comes in today with intense cellulitis on the medial part of his left thigh. This is not have a wound in this area. Extremely tender. We've been using silver alginate to the wounds on the right lower leg left lower leg with bilateral 4 layer compression he is using his external compression pumps once a day 07/04/17; patient's left medial thigh cellulitis looks better. He has not been using his compression pumps as his insert said it was  contraindicated with cellulitis. His right leg continues to make improvements all the wounds are still open. We only have one remaining wound on the left posterior calf. Using silver alginate to all open areas. He is on doxycycline which I started a week ago and should be finishing I gave him Augmentin after Thursday's visit for the severe cellulitis on the left medial thigh which fortunately looks better 07/14/17; the patient's left medial thigh cellulitis has resolved. The cellulitis in his right lower calf on the right also looks better. All of his wounds are stable to improved we've been using silver alginate he has completed the antibiotics I have given him. He has clindamycin 150 mg once a day prescribed by infectious disease for prophylaxis, I've advised him to start this  now. We have been using bilateral Unna boots over silver alginate to the wound areas 07/21/17; the patient is been to see infectious disease who noted his recurrent problems with cellulitis. He was not able to tolerate prophylactic clindamycin therefore he is on amoxicillin 500 twice a day. He also had a second daily dose of Lasix added By Dr. Oneta Rack but he is not taking this. Nor is he being completely compliant with his compression pumps a especially not this week. He has 2 remaining wounds one on the right posterior lateral lower leg and one on the left posterior medial lower leg. 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 The patient has Unna boots bilaterally. 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 He has bilateral Unna boots for compression and silver alginate to his wounds. Arrives today with his legs looking as good as I have seen him in quite some time. Not surprisingly his wounds look  better as well with improvement on the right lateral leg venous insufficiency wound and also the left medial leg. He is still using the compression pumps once a day 08/11/17; both legs appear to be doing better wounds on the right lateral and left medial legs look better. Skin on the right leg quite good. He is been using silver alginate as the primary dressing. I'm going to use Anasept gel calcium alginate and maintain all the secondary dressings 08/18/17; the patient continues to actually do quite well. The area on his right lateral leg is just about closed the left medial also looks better although it is still moist in this area. His edema is well controlled we have been using Anasept gel with calcium alginate and the usual secondary dressings, 4 layer compression and once daily use of his compression pumps "always been able to manage 09/01/17; the patient continues to do reasonably well in spite of his trip to [place]. The area on the right lateral leg is epithelialized. Left is much better but still open. He has more edema and more chronic erythema on the left leg [venous inflammation] 09/08/17; he arrives today with no open wound on the right lateral leg and decently controlled edema. Unfortunately his left leg is not nearly as in his good situation as last week.he apparently had increasing edema starting on Saturday. He edema soaked through into his foot so used a plastic bag to walk around his home. The area on the medial right leg which was his open area is about the same however he has lost surface epithelium on the left lateral which is new and he has significant pain in the Achilles area of the left foot. He is already on amoxicillin chronically for prophylaxis of cellulitis in the left leg 09/15/17; he is completed a week of doxycycline and the cellulitis in the left posterior leg and Achilles area is as usual improved. He still has a lot of edema and fluid soaking through his  dressings. There is no open wound on the right leg. He saw infectious disease NP today 09/22/17;As usual 1 we transition him from our compression wraps to his stockings things did not go well. He has several small open areas on the right leg. He states this was caused by the compression wrap on his skin although he did not wear this  with the stockings over them. He has several superficial areas on the left leg medially laterally posteriorly. He does not have any evidence of active cellulitis especially involving the left Achilles The patient is traveling from [facility] Saturday going to [facility]. He states he isn't attempting to get an appointment with a heel objects wound center there to change his dressings. I am not completely certain whether this will work 10/06/17; the patient came in on Friday for a nurse visit and the nurse reported that his legs actually look quite good. He arrives in clinic today for his regular follow-up visit. He has a new wound on his left third toe over the PIP probably caused by friction with his footwear. He has small areas on the left leg and a very superficial but epithelialized area on the right anterior lateral lower leg. Other than that his legs look as good as I've seen him in quite some time. We have been using silver alginate Review of systems; no chest pain no shortness of breath other than this a 10 point review of systems negative 10/20/17; seen by Dr. Meyer Russel last week. He had taken some antibiotics [doxycycline] that he had left over. Dr. Meyer Russel thought he had candida infection and declined to give him further antibiotics. He has a small wound remaining on the right lateral leg several areas on the left leg including a larger area on the left posterior several left medial and anterior and a small wound on the left lateral. The area on the left dorsal third toe looks a lot better. ROS; Gen.; no fever, respiratory no cough no sputum Cardiac no chest  pain other than this 10 point review of system is negative 10/30/17; patient arrives today having fallen in the bathtub 3 days ago. It took him a while to get up. He has pain and maceration in the wounds on his left leg which have deteriorated. He has not been using his pumps he also has some maceration on the right lateral leg. 11/03/17; patient continues to have weeping edema especially in the left leg. This saturates his dressings which were just put on on 12/27. As usual the doxycycline seems to take care of the cellulitis on his lower leg. He is not complaining of fever, chills, or other systemic symptoms. He states his leg feels a lot better on the doxycycline I gave him empirically. He also apparently gets injections at his primary doctor's officeo Rocephin for cellulitis prophylaxis. I didn't ask him about his compression pump compliance today I think that's probably marginal. Arrives in the clinic with all of his dressings primary and secondary macerated full of fluid and he has bilateral edema 11/10/17; the patient's right leg looks some better although there is still a cluster of wounds on the right lateral. The left leg is inflamed with almost circumferential skin loss medially to laterally although we are still maintaining anteriorly. He does not have overt cellulitis there is a lot of drainage. He is not using compression pumps. We have been using silver alginate to the wound areas, there are not a lot of options here 11/17/17; the patient's right leg continues to be stable although there is still open wounds, better than last week. The inflammation in the left leg is better. Still loss of surface layer epithelium especially posteriorly. There is no overt cellulitis in the amount of edema and his left leg is really quite good, tells me he is using his compression pumps once a day. 11/24/17; patient's  right leg has a small superficial wound laterally this continues to improve. The  inflammation in the left leg is still improving however we have continuous surface layer epithelial loss posteriorly. There is no overt cellulitis in the amount of edema in both legs is really quite good. He states he is using his compression pumps on the left leg once a day for 5 out of 7 days 12/01/17; very small superficial areas on the right lateral leg continue to improve. Edema control in both legs is better today. He has continued loss of surface epithelialization and left posterior calf although I think this is better. We have been using silver alginate with large number of absorptive secondary dressings 4 layer on the left Unna boot on the right at his request. He tells me he is using his compression pumps once a day 12/08/17; he has no open area on the right leg is edema control is good here. ooOn the left leg however he has marked erythema and tenderness breakdown of skin. He has what appears to be a wrap injury just distal to the popliteal fossa. 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 There was swelling erythema and breakdown in the right leg. He has much worse skin breakdown in the left leg as well multiple open areas medially and posteriorly as well as laterally. He tells me he has been using his compression pumps but tells me he feels that the drainage out of his leg is worse when he uses a compression pumps. To be fair to him he is been saying this for a while however I don't know that I have really been listening to this. I wonder if the compression pumps are working properly 12/22/17;. Once again he arrives with severe erythema, weeping edema from the left greater than right leg. Noncompliance with  compression pumps. New this visit he is complaining of pain on the lateral aspect of the right leg and the medial aspect of his right thigh. He apparently saw his cardiologist Dr. [name] who was ordered an echocardiogram area and I think this is a step in the right direction 12/25/17; started his doxycycline Monday night. There is still intense erythema of the right leg especially in the anterior thigh although there is less tenderness. The erythema around the wound on the right lateral calf also is less tender. He still complaining of pain in the left heel. His wounds are about the same right lateral left medial left lateral. Superficial but certainly not close to closure. He denies being systemically unwell no fever chills no abdominal pain no diarrhea 12/29/17; back in follow-up of his extensive right calf and right thigh cellulitis. I added amoxicillin to cover possible doxycycline resistant strep. This seems to of done the trick he is in much less pain there is much less erythema and swelling. He has his echocardiogram at 11:00 this morning. X-ray of the left heel was also negative. 01/05/18; the patient arrived with his edema under much better control. Now that he is retired he is able to use his compression pumps daily and sometimes twice a day per the patient. He has a wound on the right leg the lateral wound looks better. Area on the left leg also  looks a lot better. He has no evidence of cellulitis in his bilateral thighs I had a quick peak at his echocardiogram. He is in normal ejection fraction and normal left ventricular function. He has moderate pulmonary hypertension moderately reduced right ventricular function. One would have to wonder about chronic sleep apnea although he says he doesn't snore. He'll review the echocardiogram with his cardiologist. 01/12/18; the patient arrives with the edema in both legs under exemplary control. He is using his compression pumps daily and sometimes  twice daily. His wound on the right lateral leg is just about closed. He still has some weeping areas on the posterior left calf and lateral left calf although everything is just about closed here as well. I have spoken with Aldean Baker who is the patient's nurse practitioner and infectious disease. She was concerned that the patient had not understood that the parenteral penicillin injections he was receiving for cellulitis prophylaxis was actually benefiting him. I don't think the patient actually saw that I would tend to agree we were certainly dealing with less infections although he had a serious one last month. 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 He is healed. He'll be placed in juxtalite compression wraps and increase his lymphedema pumps to twice daily. We will follow up again next week to ensure there are no issues with the new regiment. 01/20/18-he is here for evaluation of bilateral lower extremity weeping edema. Yesterday he was placed in compression wrap to the right lower extremity and compression stocking to left lower shrubbery. He states he uses lymphedema pumps last night and again this morning and noted a blister to the left lower extremity. On exam he was noted to have drainage to the right lower extremity. He will be placed in Unna boots bilaterally and follow-up next week 01/26/18; patient was actually discharged a week ago to his own juxta light stockings only to return the next day with bilateral lower extremity weeping edema.he was placed in bilateral Unna boots. He arrives today with pain in the back of his left leg. There is no open area on the right leg however there is a linear/wrap injury on the left leg and weeping edema on the left leg posteriorly. I spoke with infectious disease about 10 days ago. They were disappointed that the patient elected to discontinue prophylactic intramuscular penicillin shots as they felt it  was particularly beneficial in reducing the frequency of his cellulitis. I discussed this with the patient today. He does not share this view. He'll definitely need antibiotics today. Finally he is traveling to [place] and trauma leaving this Saturday and returning a week later and he does not travel with his pumps. He is going by car 01/30/18; patient was seen 4 days ago and brought back in today for review of cellulitis in the left leg posteriorly. I put him on amoxicillin this really hasn't helped as much as I might like. He is also worried because he is traveling to [facility] trauma by car. Finally we will be rewrapping him. There is no open area on the right leg over his left leg has multiple weeping areas as usual 02/09/18; The same wrap on for 10 days. He did not pick up the last doxycycline I prescribed for him. He apparently took 4 days worth he already had. There is nothing open on his right leg and the edema control is really quite good. He's had damage in the left leg medially and laterally especially  probably related to the prolonged use of Unna boots 02/12/18; the patient arrived in clinic today for a nurse visit/wrap change. He complained of a lot of pain in the left posterior calf. He is taking doxycycline that I previously prescribed for him. Unfortunately even though he used his stockings and apparently used to compression pumps twice a day he has weeping edema coming out of the lateral part of his right leg. This is coming from the lower anterior lateral skin area. 02/16/18; the patient has finished his doxycycline and will finish the amoxicillin 2 days. The area of cellulitis in the left calf posteriorly has resolved. He is no longer having any pain. He tells me he is using his compression pumps at least once a day sometimes twice. 02/23/18; the patient finished his doxycycline and Amoxil last week. On Friday he noticed a small erythematous circle about the size of a quarter on  the left lower leg just above his ankle. This rapidly expanded and he now has erythema on the lateral and posterior part of the thigh. This is bright red. Also has an area on the dorsal foot just above his toes and a tender area just below the left popliteal fossa. He came off his prophylactic penicillin injections at his own insistence one or 2 months ago. This is obviously deteriorated since then 03/02/18; patient is on doxycycline and Amoxil. Culture I did last week of the weeping area on the back of his left calf grew group B strep. I have therefore renewed the amoxicillin 500 3 times a day for a further week. He has not been systemically unwell. Still complaining of an area of discomfort right under his left popliteal fossa. There is no open wound on the right leg. He tells me that he is using his pumps twice a day on most days 03/09/18; patient arrives in clinic today completing his amoxicillin today. The cellulitis on his left leg is better. Furthermore he tells me that he had intramuscular penicillin shots that his primary care office today. However he also states that the wrap on his right leg fell down shortly after leaving clinic last week. He developed a large blister that was present when he came in for a nurse visit later in the week and then he developed intense discomfort around this area.He tells me he is using his compression pumps 03/16/18; the patient has completed his doxycycline. The infectious part of this/cellulitis in the left heel area left popliteal area is a lot better. He has 2 open areas on the right calf. Still areas on the left calf but this is a lot better as well. 03/24/18; the patient arrives complaining of pain in the left popliteal area again. He thinks some of this is wrap injury. He has no open area on the right leg and really no open area on the left calf either except for the popliteal area. He claims to be compliant with the compression pumps 03/31/18; I gave  him doxycycline last week because of cellulitis in the left popliteal area. This is a lot better although the surface epithelium is denuded off and response to this. He arrives today with uncontrolled edema in the right calf area as well as a fingernail injury in the right lateral calf. There is only a few open areas on the left 04/06/18; I gave him amoxicillin doxycycline over the last 2 weeks that the amoxicillin should be completing currently. He is not complaining of any pain or systemic symptoms. The only  open areas see has is on the right lateral lower leg paradoxically I cannot see anything on the left lower leg. He tells me he is using his compression pumps twice a day on most days. Silver alginate to the wounds that are open under 4 layer compression 04/13/18; he completed antibiotics and has no new complaints. Using his compression pumps. Silver alginate that anything that's opened 04/20/18; he is using his compression pumps religiously. Silver alginate 4 layer compression anything that's opened. He comes in today with no open wounds on the left leg but 3 on the right including a new one posteriorly. He has 2 on the right lateral and one on the right posterior. He likes Unna boots on the right leg for reasons that aren't really clear we had the usual 4 layer compression on the left. It may be necessary to move to the 4 layer compression on the right however for now I left them in the Unna boots 04/27/18; he is using his compression pumps at least once a day. He has still the wounds on the right lateral calf. The area right posteriorly has closed. He does not have an open wound on the left under 4 layer compression however on the dorsal left foot just proximal to the toes and the left third toe 2 small open areas were identified 05/11/18; he has not uses compression pumps. The areas on the right lateral calf have coalesced into one large wound necrotic surface. On the left side he has one small  wound anteriorly however the edema is now weeping out of a large part of his left leg. He says he wasn't using his pumps because of the weeping fluid. I explained to him that this is the time he needs to pump more 05/18/18; patient states he is using his compression pumps twice a day. The area on the right lateral large wound albeit superficial. On the left side he has innumerable number of small new wounds on the left calf particularly laterally but several anteriorly and medially. All these appear to have healthy granulated base these look like the remnants of blisters however they occurred under compression. The patient arrives in clinic today with his legs somewhat better. There is certainly less edema, less multiple open areas on the left calf and the right anterior leg looks somewhat better as well superficial and a little smaller. However he relates pain and erythema over the last 3-4 days in the thigh and I looked at this today. He has not been systemically unwell no fever no chills no change in blood sugar values 05/25/18; comes in today in a better state. The severe cellulitis on his left leg seems better with the Keflex. Not as tender. He has not been systemically unwell ooHard to find an open wound on the left lower leg using his compression pumps twice a day ooThe confluent wounds on his right lateral calf somewhat better looking. These will ultimately need debridement I didn't do this today. 06/01/18; the severe cellulitis on the left anterior thigh has resolved and he is completed his Keflex. ooThere is no open wound on the left leg however there is a superficial excoriation at the base of the third toe dorsally. Skin on the bottom of his left foot is macerated looking. ooThe left the wounds on the lateral right leg actually looks some better although he did require debridement of the top half of this wound area with an open curet 06/09/18 on evaluation today patient appears to  be  doing poorly in regard to his right lower extremity in particular this appears to likely be infected he has very thick purulent discharge along with a bright green tent to the discharge. This makes me concerned about the possibility of pseudomonas. He's also having increased discomfort at this point on evaluation. Fortunately there does not appear to be any evidence of infection spreading to the other location at this time. 06/16/18 on evaluation today patient appears to actually be doing fairly well. His ulcer has actually diminished in size quite significantly at this point which is good news. Nonetheless he still does have some evidence of infection he did see infectious disease this morning before coming here for his appointment. I did review the results of their evaluation and their note today. They did actually have him discontinue the Cipro and initiate treatment with linezolid at this time. He is doing this for the next seven days and they recommended a follow-up in four months with them. He is the keep a log of the need for intermittent antibiotic therapy between now and when he falls back up with infectious disease. This will help them gaze what exactly they need to do to try and help them out. 06/23/18; the patient arrives today with no open wounds on the left leg and left third toe healed. He is been using his compression pumps twice a day. On the right lateral leg he still has a sizable wound but this is a lot better than last time I saw this. 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 Has been using silver alginate under 4 layer compression 06/30/18; the only open wound he has is on the right lateral leg and this looks healthy. No debridement is required. We have been using silver alginate. He does not have an open wound on the left leg. There is apparently some drainage from the  dorsal proximal third toe on the left although I see no open wound here. 07/03/18 on evaluation today patient was actually here just for a nurse visit rapid change. However when he was here on Wednesday for his rat change due to having been healed on the left and then developing blisters we initiated the wrap again knowing that he would be back today for [place] to reevaluate and see were at. Unfortunately he has developed some cellulitis into the proximal portion of his right lower extremity even into the region of his thigh. 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 He was placed on one as what at that point. Nonetheless he is done with that and has been tolerating it well otherwise. Doxycycline which in the past really did not seem to be effective for him. Nonetheless I think the best option may be for [place] to definitely reinitiate the antibiotics for a longer period of time. 07/07/18; since I last saw this patient a week ago he has had a difficult time. At that point he did not have an open wound on his left leg. We transitioned him into juxta light stockings. He was apparently in the clinic the next day with blisters on the left lateral and left medial lower calf. He also had weeping edema fluid. He was put back into a compression wrap. He was also in the clinic on Friday with intense erythema in his right thigh. Per the patient he was  started on Bactrim however that didn't work at all in terms of relieving his pain and swelling. He has taken 3 doxycycline that he had left over from last time and that seems to of helped. He has blistering on the right thigh as well. 07/14/18; the erythema on his right thigh has gotten better with doxycycline that he is finishing. 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 Presumably this cellulitis in the thigh was not related to that although I think  this is a bit concerning going forward. He still has an area on the right lateral calf the blister on the right medial calf just below the knee that was discussed above. On the left 2 small open areas left medial and left lateral. Edema control is adequate. He is using his compression pumps twice a day 07/20/18; continued improvement in the condition of both legs especially the edema in his bilateral thighs. He tells me he is been losing weight through a combination of diet and exercise. He is using his compression pumps twice a day. So overall she made to the remaining wounds 07/27/2018; continued improvement in condition of both legs. His edema is well controlled. The area on the right lateral leg is just about closed he had one blisters show up on the medial left upper calf. We have him in 4 layer compression. He is going on a 10-day trip to [place], [place] and [place]. He will be driving. He wants to wear Unna boots because of the lessening amount of constriction. He will not use compression pumps while he is away 08/05/18 on evaluation today patient actually appears to be doing decently well all things considered in regard to his bilateral lower extremities. The worst ulcer is actually only posterior aspect of his left lower extremity with a four layer compression wrap cut into his leg a couple weeks back. He did have a trip and actually had [employer] for the trip that he is worn since he was last here. Nonetheless he feels like the [employer] actually do better for him his swelling is up a little bit but he also with his trip was not taking his Lasix on a regular set schedule like he was supposed to be. He states that obviously the reason being that he cannot drive and keep going without having to urinate too frequently which makes it difficult. He did not have his pumps with him while he was away either which I think also maybe playing a role here too. 08/13/2018; the  patient only has a small open wound on the right lateral calf which is a big improvement in the last month or 2. He also has the area posteriorly just below the posterior fossa on the left which I think was a wrap injury from several weeks ago. He has no current evidence of cellulitis. He tells me he is back into his compression pumps twice a day. He also tells me that while he was at the laundromat somebody stole a section of his extremitease stockings 08/20/2018; back in the clinic with a much improved state. He only has small areas on the right lateral mid calf which is just about healed. This was is more substantial area for quite a prolonged period of time. He has a small open area on the left anterior tibia. The area on the posterior calf just below the popliteal fossa is closed  today. He is using his compression pumps twice a day 08/28/2018; patient has no open wound on the right leg. He has a smattering of open areas on the calf with some weeping lymphedema. More problematically than that it looks as though his wraps of slipped down in his usual he has very angry upper area of edema just below the right medial knee and on the right lateral calf. He has no open area on his feet. The patient is traveling to [facility] next week. I will send him in an antibiotic. We will continue to wrap the right leg. We ordered extremitease stockings for him last week and I plan to transition the right leg to a stocking when he gets home which will be in 10 days time. As usual he is very reluctant to take his pumps with him when he travels 09/07/2018; patient returns from [facility]. He shows me a picture of his left leg in the mid part of his trip last week with intense fire engine erythema. The picture look bad enough I would have considered sending him to the hospital. Instead he went to the wound care center in [facility]. They did not prescribe him antibiotics but he did take some  doxycycline he had leftover from a previous visit. I had given him trimethoprim sulfamethoxazole before he left this did not work according to the patient. This is resulted in some improvement fortunately. He comes back with a large wound on the left posterior calf. Smaller area on the left anterior tibia. Denuded blisters on the dorsal left foot over his toes. Does not have much in the way of wounds on the right leg although he does have a very tender area on the right posterior area just below the popliteal fossa also suggestive of infection. He promises me he is back on his pumps twice a day 09/15/2018; the intense cellulitis in his left lower calf is a lot better. The wound area on the posterior left calf is also so better. However he has reasonably extensive wounds on the dorsal aspect of his second and third toes and the proximal foot just at the base of the toes. There is nothing open on the right leg 09/22/2018; the patient has excellent edema control in his legs bilaterally. He is using his external compression pumps twice a day. He has no open area on the right leg and only the areas in the left foot dorsally second and third toe area on the left side. He does not have any signs of active cellulitis. 10/06/2018; the patient has good edema control bilaterally. He has no open wound on the right leg. There is a blister in the posterior aspect of his left calf that we had to deal with today. He is using his compression pumps twice a day. There is no signs of active cellulitis. We have been using silver alginate to the wound areas. He still has vulnerable areas on the base of his left first second toes dorsally He has a his extremities stockings and we are going to transition him today into the stocking on the right leg. He is cautioned that he will need to continue to use the compression pumps twice a day. If he notices uncontrolled edema in the right leg he may need to go to 3 times a  day. 10/13/2018; the patient came in for a nurse check on Friday he has a large flaccid blister on the right medial calf just below the knee.  We unroofed this. He has this and a new area underneath the posterior mid calf which was undoubtedly a blister as well. He also has several small areas on the right which is the area we put his extremities stocking on. 10/19/2018; the patient went to see infectious disease this morning I am not sure if that was a routine follow-up in any case the doxycycline I had given him was discontinued and started on linezolid. He has not started this. It is easy to look at his left calf and the inflammation and think this is cellulitis however he is very tender in the tissue just below the popliteal fossa and I have no doubt that there is infection going on here. He states the problem he is having is that with the compression pumps the edema goes down and then starts walking the wrap falls down. We will see if we can adhere this. He has 1 or 2 minuscule open areas on the right still areas that are weeping on the posterior left calf, the base of his left second and third toes 10/26/18; back today in clinic with quite of skin breakdown in his left anterior leg. This may have been infection the area below the popliteal fossa seems a lot better however tremendous epithelial loss on the left anterior mid tibia area over quite inexpensive tissue. He has 2 blisters on the right side but no other open wound here. 10/29/2018; came in urgently to see [place] today and we worked him in for review. He states that the 4 layer compression on the right leg caused pain he had to cut it down to roughly his mid calf this caused swelling above the wrap and he has blisters and skin breakdown today. As a result of the pain he has not been using his pumps. Both legs are a lot more edematous and there is a lot of weeping fluid. 11/02/18; arrives in clinic with continued difficulties in the right  leg> left. Leg is swollen and painful. multiple skin blisters and new open areas especially laterally. He has not been using his pumps on the right leg. 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 He is not systemically unwell. 11/09/2018; the patient claims he is being compliant with his pumps. He is finished the doxycycline I gave him last week. Culture I did of the wound on the right lateral leg showed a few very resistant methicillin staph aureus. This was resistant to doxycycline. Nevertheless he states the pain in the leg is a lot better which makes me wonder if the cultured organism was not really what was causing the problem nevertheless this is a very dangerous organism to be culturing out of any wound. His right leg is still a lot larger than the left. He is using an Radio broadcast assistant on this area, he blames a 4-layer compression for causing the original skin breakdown which I doubt is true however I cannot talk him out of it. We have been using silver alginate to all of these areas which were initially blisters 11/16/2018; patient is being compliant with his external compression pumps at twice a day. Miraculously he arrives in clinic today with absolutely no open wounds. He has better edema control on the left where he has been using 4 layer compression versus wound of wounds on the right and I pointed this out to him. There is no inflammation in the skin in his lower legs which is also somewhat unusual  for him. There is no open wounds on the dorsal left foot. He has extremitease stockings at home and I have asked him to bring these in next week. 11/25/18 patient's lower extremity on examination today on the left appears for the most part to be wound free. He does have an open wound on the lateral aspect of the right lower extremity but this is minimal compared to what I've seen in past. He does request that we go ahead and wrap the left leg as well even  though there's nothing open just so hopefully it will not reopen in short order. 1/28; patient has superficial open wounds on the right lateral calf left anterior calf and left posterior calf. His edema control is adequate. 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 We have been using silver alginate as the primary dressing. He claims compliance with his compression pumps 2/4; patient has superficial open wounds on numerous areas of his left calf and again one on the left dorsal foot. The areas on the right lateral calf have healed. The cellulitis that I gave him doxycycline for last week is also resolved this was mostly on the left anterior calf just below the tibial tuberosity. His edema looks fairly well-controlled. He tells me he went to see his primary doctor today and had blood work ordered 2/11; once again he has several open areas on the left calf left tibial area. Most of these are small and appear to have healthy granulation. He does not have anything open on the right. The edema and control in his thighs is pretty good which is usually a good indication he has been using his pumps as requested. 2/18; he continues to have several small areas on the left calf and left tibial area. Most of these are small healthy granulation. We put him in his stocking on the right leg last week and he arrives with a superficial open area over the right upper tibia and a fairly large area on the right lateral tibia in similar condition. His edema control actually does not look too bad, he claims to be using his compression pumps twice a day 2/25. Continued small areas on the left calf and left tibial area. New areas especially on the right are identified just below the tibial tuberosity and on the right upper tibia itself. There are also areas of weeping edema fluid even without an obvious wound. He does not have a  considerable degree of lymphedema but clearly there is more edema here than his skin can handle. He states he is using the pumps twice a day. We have an Unna boot on the right and 4 layer compression on the left. 3/3; he continues to have an area on the right lateral calf and right posterior calf just below the popliteal fossa. There is a fair amount of tenderness around the wound on the popliteal fossa but I did not see any evidence of cellulitis, could just be that the wrap came down and rubbed in this area. ooHe does not have an open area on the left leg however there is an area on the left dorsal foot at the base of the third toe ooWe have been using silver alginate to all wound areas 3/10; he did not have an open area on his left leg last time he was here a week ago. Today he arrives with a horizontal wound just below the  tibial tuberosity and an area on the left lateral calf. He has intense erythema and tenderness in this area. The area is on the right lateral calf and right posterior calf better than last week. We have been using silver alginate as usual 3/18 - Patient returns with 3 small open areas on left calf, and 1 small open area on right calf, the skin looks ok with no significant erythema, he continues the UNA boot on right and 4 layer compression on left. The right lateral calf wound is closed , the right posterior is small area. we will continue silver alginate to the areas. 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 He is been tolerating the dressing changes without complication. Fortunately she has made excellent improvement in regard to the overall status of his wounds. Unfortunately every time we cease wrapping him he ends up reopening in causing more significant issues at that  point. Again I'm unsure of the best direction to take although I think the lymphedema clinic may be appropriate for him. 02/03/19 on evaluation today patient appears to be doing well in regard to the wounds that we saw him for last week unfortunately he has a new area on the proximal portion of his right medial/posterior lower extremity where the wrap somewhat slowed down and caused swelling and a blister to rub and open. Unfortunately this is the only opening that he has on either leg at this point. 02/17/19 on evaluation today patient's bilateral lower extremities appear to be doing well. He still completely healed in regard to the left lower extremity. In regard to the right lower extremity the area where the wrap and slid down and caused the blister still seems to be slightly open although this is dramatically better than during the last evaluation two weeks ago. I'm very pleased with the way this stands overall. 03/03/19 on evaluation today patient appears to be doing well in regard to his right lower extremity in general although he did have a new blister open this does not appear to be showing any evidence of active infection at this time. Fortunately there's No fevers, chills, nausea, or vomiting noted at this time. Overall I feel like he is making good progress it does feel like that the right leg will we perform the [employer] seems to do with a bit better than three layer wrap on the left which slid down on him. We may switch to doing bilateral in the book wraps. 5/4; I have not seen Mr. [name] in quite some time. According to our case manager he did not have an open wound on his left leg last week. He had 1 remaining wound on the right posterior medial calf. He arrives today with multiple openings on the left leg probably were blisters and/or wrap injuries from Unna boots. I do not think the Unna boot's will provide adequate compression on the left. I am also not clear about the frequency he  is using the compression pumps. 03/17/19 on evaluation today patient appears to be doing excellent in regard to his lower extremities compared to last week's evaluation apparently. He had gotten significantly worse last week which is unfortunate. The [employer] wrap on the left did not seem to do very well for him at all and in fact it didn't control his  swelling significantly enough he had an additional outbreak. Subsequently we go back to the four layer compression wrap on the left. This is good news. At least in that he is doing better and the wound seem to be killing him. He still has not heard anything from the lymphedema clinic. 03/24/19 on evaluation today patient actually appears to be doing much better in regard to his bilateral lower [place] as compared to last week when I saw him. Fortunately there's no signs of active infection at this time. He has been tolerating the dressing changes without complication. Overall I'm extremely pleased with the progress and appearance in general. 04/07/19 on evaluation today patient appears to be doing well in regard to his bilateral lower extremities. His swelling is significantly down from where it was previous. With that being said he does have a couple blisters still open at this point but fortunately nothing that seems to be too severe and again the majority of the larger openings has healed at this time. 04/14/19 on evaluation today patient actually appears to be doing quite well in regard to his bilateral lower extremities in fact I'm not even sure there's anything significantly open at this time at any site. Nonetheless he did have some trouble with these wraps where they are somewhat irritating him secondary to the fact that he has noted that the graph wasn't too close down to the end of this foot in a little bit short as well up to his knee. Otherwise things seem to be doing quite well. 04/21/19 upon evaluation today patient's wound bed actually  showed evidence of being completely healed in regard to both lower extremities which is excellent news. There does not appear to be any signs of active infection which is also good news. I'm very pleased in this regard. No fevers, chills, nausea, or vomiting noted at this time. 04/28/19 on evaluation today patient appears to be doing a little bit worse in regard to both lower extremities on the left mainly due to the fact that when he went infection disease the wrap was not wrapped quite high enough he developed a blister above this. On the right he is a small open area of nothing too significant but again this is continuing to give him some trouble he has been were in the Velcro compression that he has at home. 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 He's been tolerating the dressing changes without complication. Fortunately there's no signs of active infection at this time. No fevers, chills, nausea, or vomiting noted at this time. We have been trying to get an appointment with her lymphedema clinic in [facility] but unfortunately nobody can get them on phone with not been able to even fax information over the patient likewise is not been able to get in touch with them. Overall I'm not sure exactly what's going on here with to reach out again today. 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 Still having a lot of drainage unfortunately. He tells me especially in the left but even on the right. There's no signs of active infection which is good news we've been using so ratcheted up to this point. 05/19/19 on evaluation today patient actually appears to be doing quite well with regard to his left lower extremity which is great news. Fortunately in regard to the right lower extremity has  an issues with his wrap and he subsequently did  remove this from what I'm understanding. Nonetheless long story short is what he had rewrapped once he removed it subsequently had maggots underneath this wrap whenever he came in for evaluation today. With that being said they were obviously completely cleaned away by the nursing staff. The visit today which is excellent news. However he does appear to potentially have some infection around the right ankle region where the maggots were located as well. He will likely require anabiotic therapy today. 05/26/19 on evaluation today patient actually appears to be doing much better in regard to his bilateral lower extremities. I feel like the infection is under much better control. With that being said there were maggots noted when the wrap was removed yet again today. Again this could have potentially been left over from previous although at this time there does not appear to be any signs of significant drainage there was obviously on the wrap some drainage as well this contracted gnats or otherwise. Either way I do not see anything that appears to be doing worse in my pinion and in fact I think his drainage has slowed down quite significantly likely mainly due to the fact to his infection being under better control. 06/02/2019 on evaluation today patient actually appears to be doing well with regard to his bilateral lower extremities there is no signs of active infection at this time which is great news. With that being said he does have several open areas more so on the right than the left but nonetheless these are all significantly better than previously noted. 06/09/2019 on evaluation today patient actually appears to be doing well. His wrap stayed up and he did not cause any problems he had more drainage on the right compared to the left but overall I do not see any major issues at this time which is great news. 06/16/2019 on evaluation today patient appears to be doing excellent with regard to his  lower extremities the only area that is open is a new blister that can have opened as of today on the medial ankle on the left. Other than this he really seems to be doing great I see no major issues at this point. 06/23/2019 on evaluation today patient appears to be doing quite well with regard to his bilateral lower extremities. In fact he actually appears to be almost completely healed there is a small area of weeping noted of the right lower extremity just above the ankle. Nonetheless fortunately there is no signs of active infection at this time which is good news. No fevers, chills, nausea, vomiting, or diarrhea. 8/24; the patient arrived for a nurse visit today but complained of very significant pain in the left leg and therefore I was asked to look at this. Noted that he did not have an open area on the left leg last week nevertheless this was wrapped. The patient states that he is not been able to put his compression pumps on the left leg because of the discomfort. He has not been systemically unwell 06/30/2019 on evaluation today patient unfortunately despite being excellent last week is doing much worse with regard to his left lower extremity today. In fact he had to come in for a nurse on Monday where his left leg had to be rewrapped due to excessive weeping Dr. Leanord Hawking placed him on doxycycline at that point. Fortunately there is no signs of active infection Systemically at this time which is good news.  07/07/2019 in regard to the patient's wounds today he actually seems to be doing well with his right lower extremity there really is nothing open or draining at this point this is great news. Unfortunately the left lower extremity is given him additional trouble at this time. There does not appear to be any signs of active infection nonetheless he does have a lot of edema and swelling noted at this point as well as blistering all of which has led to a much more poor appearing leg at this  time compared to where it was 2 weeks ago when it was almost completely healed. Obviously this is a little discouraging for the patient. He is try to contact the lymphedema clinic in [place] he has not been able to get through to them. 07/14/2019 on evaluation today patient actually appears to be doing slightly better with regard to his left lower extremity ulcers. Overall I do feel like at least at the top of the wrap that we have been placing this area has healed quite nicely and looks much better. The remainder of the leg is showing signs of improvement. Unfortunately in the thigh area he still has an open region on the left and again on the right he has been utilizing just a Band-Aid on an area that also opened on the thigh. Again this is an area that were not able to wrap although we did do an Ace wrap to provide some compression that something that obviously is a little less effective than the compression wraps we have been using on the lower portion of the leg. He does have an appointment with the lymphedema clinic in [facility] on Friday. 07/21/2019 on evaluation today patient appears to be doing better with regard to his lower extremity ulcers. He has been tolerating the dressing changes without complication. Fortunately there is no signs of active infection at this time. No fevers, chills, nausea, vomiting, or diarrhea. I did receive the paperwork from the physical therapist at the lymphedema clinic in [place]. Subsequently I signed off on that this morning and sent that back to him for further progression with the treatment plan. 07/28/2019 on evaluation today patient appears to be doing very well with regard to his right lower extremity where I do not see any open wounds at this point. Fortunately he is feeling great as far as that is concerned as well. In regard to the left lower extremity he has been having issues with still several areas of weeping and edema although the  upper leg is doing better his lower leg still I think is going require the compression wrap at this time. No fevers, chills, nausea, vomiting, or diarrhea. 08/04/2019 on evaluation today patient unfortunately is having new wounds on the right lower extremity. Again we have been using Unna boot wrap on that side. We switched him to using his juxta lite wrap at home. With that being said he tells me he has been using it although his legs extremely swollen and to be honest really does not appear that he has been. I cannot know that for sure however. Nonetheless he has multiple new wounds on the right lower extremity at this time. Obviously we will have to see about getting this rewrapped for him today. 08/11/2019 on evaluation today patient appears to be doing fairly well with regard to his wounds. He has been tolerating the dressing changes including the compression wraps without complication. He still has a lot of edema in his upper thigh regions  bilaterally he is supposed to be seeing the lymphedema clinic on the 15th of this month once his wraps arrive for the upper part of his legs. 08/18/2019 on evaluation today patient appears to be doing well with regard to his bilateral lower extremities at this point. He has been tolerating the dressing changes without complication. Fortunately there is no signs of active infection which is also good news. He does have a couple weeping areas on the first and second toe of the right foot he also has just a small area on the left foot upper leg and a small area on the left lower leg but overall he is doing quite well in my opinion. He is supposed to be getting his wraps shortly in fact tomorrow and then subsequently is seeing the lymphedema clinic next Wednesday on the 21st. Of note he is also leaving on the 25th to go on vacation for a week to the beach. For that reason and since there is some uncertainty about what there can be doing at lymphedema clinic next  Wednesday I am get a make an appointment for next Friday here for [place] to see what we need to do for him prior to him leaving for vacation. 10/23; patient arrives in considerable pain predominantly in the upper posterior calf just distal to the popliteal fossa also in the wound anteriorly above the major wound. This is probably cellulitis and he has had this recurrently in the past. He has no open wound on the right side and he has had an Radio broadcast assistant in that area. Finally I note that he has an area on the left posterior calf which by enlarge is mostly epithelialized. This protrudes beyond the borders of the surrounding skin in the setting of dry scaly skin and lymphedema. The patient is leaving for [facility] on Sunday. Per his longstanding pattern, he will not take his compression pumps with him predominantly out of fear that they will be stolen. He therefore asked that we put a Unna boot back on the right leg. He will also contact the wound care center in [facility] to see if they can change his dressing in the mid week. 11/3; patient returned from his vacation to [facility]. He was seen on 1 occasion at their wound care center. They did a 2 layer compression system as they did not have our 4-layer wrap. I am not completely certain what they put on the wounds. They did not change the Unna boot on the right. The patient is also seeing a lymphedema specialist physical therapist in [place]. It appears that he has some compression sleeve for his thighs which indeed look quite a bit better than I am used to seeing. He pumps over these with his external compression pumps. 11/10; the patient has a new wound on the right medial thigh otherwise there is no open areas on the right. He has an area on the left leg posteriorly anteriorly and medially and an area over the left second toe. We have been using silver alginate. He thinks the injury on his thigh is secondary to friction from the  compression sleeve he has. 11/17; the patient has a new wound on the right medial thigh last week. He thinks this is because he did not have a underlying stocking for his thigh juxta lite apparatus. He now has this. The area is fairly large and somewhat angry but I do not think he has underlying cellulitis. ooHe has a intact blister on  the right anterior tibial area. ooSmall wound on the right great toe dorsally ooSmall area on the medial left calf. 11/30; the patient does not have any open areas on his right leg and we did not take his juxta lite stocking off. However he states that on Friday his compression wrap fell down lodging around his upper mid calf area. As usual this creates a lot of problems for him. He called urgently today to be seen for a nurse visit however the nurse visit turned into a provider visit because of extreme erythema and pain in the left anterior tibia extending laterally and posteriorly. The area that is problematic is extensive 10/06/2019 upon evaluation today patient actually appears to be doing poorly in regard to his left lower extremity. He Dr. Leanord Hawking did place him on doxycycline this past Monday apparently due to the fact that he was doing much worse in regard to this left leg. Fortunately the doxycycline does seem to be helping. Unfortunately we are still having a very difficult time getting his edema under any type of control in order to anticipate discharge at some point. The only way were really able to control his lymphedema really is with compression wraps and that has only even seemingly temporary. He has been seeing a lymphedema clinic they are trying to help in this regard but still this has been somewhat frustrating in general for the patient. 10/13/19 on evaluation today patient appears to be doing excellent with regard to his right lower extremity as far as the wounds are concerned. His swelling is still quite extensive unfortunately. He is still  having a lot of drainage from the thigh areas bilaterally which is unfortunate. He's been going to lymphedema clinic but again he still really does not have this edema under control as far as his lower extremities are concern. With regard to his left lower extremity this seems to be improving and I do believe the doxycycline has been of benefit for him. He is about to complete the doxycycline. 10/20/2019 on evaluation today patient appears to be doing poorly in regard to his bilateral lower extremities. More in the right thigh he has a lot of irritation at this site unfortunately. In regard to the left lower extremity the wrap was not quite as high it appears and does seem to have caused him some trouble as well. Fortunately there is no evidence of systemic infection though he does have some blue-green drainage which has me concerned for the possibility of Pseudomonas. He tells me he is previously taking Cipro without complications and he really does not care for Levaquin however due to some of the side effects he has. He is not allergic to any medications specifically antibiotics that were aware of. 10/27/2019 on evaluation today patient actually does appear to be for the most part doing better when compared to last week's evaluation. With that being said he still has multiple open wounds over the bilateral lower extremities. He actually forgot to start taking the Cipro and states that he still has the whole bottle. He does have several new blisters on left lower extremity today I think I would recommend he go ahead and take the Cipro based on what I am seeing at this point. 12/30-Patient comes at 1 week visit, 4 layer compression wraps on the left and Unna boot on the right, primary dressing Xtrasorb and silver alginate. Patient is taking his Cipro and has a few more days left probably 5-6, and the legs are doing  better. He states he is using his compressions devices which I believe he  has 11/10/2019 on evaluation today patient actually appears to be much better than last time I saw him 2 weeks ago. His wounds are significantly improved and overall I am very pleased in this regard. Fortunately there is no signs of active infection at this time. He is just a couple of days away from completing Cipro. Overall his edema is much better he has been using his lymphedema pumps which I think is also helping at this point. 11/17/2019 on evaluation today patient appears to be doing excellent in regard to his wounds in general. His legs are swollen but not nearly as much as they have been in the past. Fortunately he is tolerating the compression wraps without complication. No fevers, chills, nausea, vomiting, or diarrhea. He does have some erythema however in the distal portion of his right lower extremity specifically around the forefoot and toes there is a little bit of warmth here as well. 11/24/2019 on evaluation today patient appears to be doing well with regard to his right lower extremity I really do not see any open wounds at this point. His left lower extremity does have several open areas and his right medial thigh also is open. Other than this however overall the patient seems to be making good progress and I am very pleased at this point. 12/01/2019 on evaluation today patient appears to be doing poorly at this point in regard to his left lower extremity has several new blisters despite the fact that we have him in compression wraps. In fact he had a 4-layer compression wrap, his upper thigh wrapped from lymphedema clinic, and a juxta light over top of the 4 layer compression wrap the lymphedema clinic applied and despite all this he still develop blisters underneath. Obviously this does have me concerned about the fact that unfortunately despite what we are doing to try to get wounds healed he continues to have new areas arise I do not think he is ever good to be at the point where  he can realistically just use wraps at home to keep things under control. Typically when we heal him it takes about 1-2 days before he is back in the clinic with severe breakdown and blistering of his lower extremities bilaterally. This is happened numerous times in the past. Unfortunately I think that we may need some help as far as overall fluid overload to kind of limit what we are seeing and get things under better control. 12/08/2019 on evaluation today patient presents for follow-up concerning his ongoing bilateral lower extremity edema. Unfortunately he is still having quite a bit of swelling the compression wraps are controlling this to some degree but he did see Dr. [name] his cardiologist I do have that available for review today as far as the appointment was concerned that was on 12/06/2019. Obviously that she has been 2 days ago. The patient states that he is only been taking the Lasix 80 mg 1 time a day he had told me previously he was taking this twice a day. Nonetheless Dr. [name] recommended this be up to 80 mg 2 times a day for the patient as he did appear to be fluid overloaded. With that being said the patient states he did this yesterday and he was unable to go anywhere or do anything due to the fact that he was constantly having to urinate. Nonetheless I think that this is still good to be something  that is important for him as far as trying to get his edema under control at all things that he is going to be able to just expect his wounds to get under control and things to be better without going through at least a period of time where he is trying to stabilize his fluid management in general and I think increasing the Lasix is likely the first step here. It was also mentioned the possibility that the patient may require metolazone. With that being said he wanted to have the patient take Lasix twice a day first and then reevaluating 2 months to see where things stand. 12/15/2019 upon  evaluation today patient appears to be doing regard to his legs although his toes are showing some signs of weeping especially on the left at this point to some degree on the right. There does not appear to be any signs of active infection and overall I do feel like the compression wraps are doing well for him but he has not been able to take the Lasix at home and the increased dose that Dr. [name] recommended. He tells me that just not go to be feasible for him. Nonetheless I think in this case he should probably send a message to Dr. [name] in order to discuss options from the standpoint of possible admission to get the fluid off or otherwise going forward. 12/22/2019 upon evaluation today patient appears to be doing fairly well with regard to his lower extremities at this point. In fact he would be doing excellent if it was not for the fact that his right anterior thigh apparently had an allergic reaction to adhesive tape that he used. The wound itself that we have been monitoring actually appears to be healed. There is a lot of irritation at this point. 12/29/2019 upon evaluation today patient appears to be doing well in regard to his lower extremities. His left medial thigh is open and somewhat draining today but this is the only region that is open the right has done much better with the treatment utilizing the steroid cream that I prescribed for him last week. Overall I am pleased in that regard. Fortunately there is no signs of active infection at this time. No fevers, chills, nausea, vomiting, or diarrhea. 01/05/2020 upon evaluation today patient appears to be doing more poorly in regard to his right lower extremity at this point upon evaluation today. Unfortunately he continues to have issues in this regard and I think the biggest issue is controlling his edema. This obviously is not very well controlled at this point is been recommended that he use the Lasix twice a day but he has not been able  to do that. Unfortunately I think this is leading to an issue where honestly he is not really able to effectively control his edema and therefore the wounds really are not doing significantly better. I do not think that he is going to be able to keep things under good control unless he is able to control his edema much better. I discussed this again in great detail with him today. Objective Constitutional Obese and well-hydrated in no acute distress. Vitals Time Taken: 11:50 AM, Height: 70 in, Weight: 380.2 lbs, BMI: 54.5, Temperature: 97.9 F, Pulse: 51 bpm, Respiratory Rate: 18 breaths/min, Blood Pressure: 155/49 mmHg. Respiratory normal breathing without difficulty. Psychiatric this patient is able to make decisions and demonstrates good insight into disease process. Alert and Oriented x 3. pleasant and cooperative. General Notes: Patient's wound bed  currently showed signs of good granulation at this time. Unfortunately he does have a couple new wounds on the right and left lower extremities as well as his left toe. Unfortunately I think this is just to be the nature of what happens when he is wrapped he is less likely to get these when he is using his Velcro compression wraps he is more likely to get him obviously to use nothing that would be the worst case scenario. Either way I do not foresee a time when he is going to be able to go without coming to the wound center unless he can somehow get his edema under better control. Integumentary (Hair, Skin) Wound #158 status is Healed - Epithelialized. Original cause of wound was Blister. The wound is located on the Left Upper Leg. The wound measures 0cm length x 0cm width x 0cm depth; 0cm^2 area and 0cm^3 volume. Wound #159 status is Open. Original cause of wound was Blister. The wound is located on the Right,Lateral Lower Leg. The wound measures 1.4cm length x 2.5cm width x 0.1cm depth; 2.749cm^2 area and 0.275cm^3 volume. There is no  tunneling or undermining noted. There is a medium amount of serosanguineous drainage noted. The wound margin is distinct with the outline attached to the wound base. There is medium (34-66%) red granulation within the wound bed. There is no necrotic tissue within the wound bed. Wound #160 status is Open. Original cause of wound was Gradually Appeared. The wound is located on the Left Toe Fourth. The wound measures 0.4cm length x 0.3cm width x 0.1cm depth; 0.094cm^2 area and 0.009cm^3 volume. There is no tunneling or undermining noted. There is a small amount of serosanguineous drainage noted. The wound margin is distinct with the outline attached to the wound base. There is large (67-100%) pink granulation within the wound bed. 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 There was a Radio broadcast assistant Compression Therapy Procedure by [name], RN. Post procedure Diagnosis Wound #159: Same as Pre-Procedure There was a Four Layer Compression Therapy Procedure by [name], RN. Post procedure Diagnosis Wound #: Same as Pre-Procedure Plan Follow-up Appointments: Return Appointment in 1 week. Dressing Change Frequency: Wound #159 Right,Lateral Lower Leg: Do not change entire dressing for one week. Wound #160 Left Toe Fourth: Do not change entire dressing for one week. Skin Barriers/Peri-Wound Care: Moisturizing lotion - both legs TCA Cream or Ointment - mixed with  lotion Wound Cleansing: May shower with protection. Primary Wound Dressing: Wound #159 Right,Lateral Lower Leg: Calcium Alginate with Silver Wound #160 Left Toe Fourth: Calcium Alginate with Silver Secondary Dressing: Wound #159 Right,Lateral Lower Leg: ABD pad Wound #160 Left Toe Fourth: Dry Gauze Drawtex Edema Control: 4 layer compression: Left lower extremity Unna Boot to Right Lower Extremity Avoid standing for long periods of time Elevate legs to the level of the heart or above for 30 minutes daily and/or when sitting, a frequency of: - throughout the day Exercise regularly Segmental Compressive Device. - lymphadema pumps 60 min 2 times per day Other: - lymphadema wraps to upper leg  per [facility] 1. I would recommend currently we continue with the Unna boot wrap on the right lower extremity and the 4-layer compression wrap on the left lower extremity. Next 2. I would recommend a silver alginate dressing to any open wound locations. 3. I am also can recommend that we go ahead and continue with a recommendation to continue with the lymphedema pumps 60 minutes 3 times a day. 4. I am also going to suggest he needs to exercise regularly he told me he was going to be going to the [facility] that is why he can take the increase Lasix because then he would be able to go without having to run to the bathroom all the time. With that being said he still is never going to the [facility]. We will see patient back for reevaluation in 1 week here in the clinic. If anything worsens or changes patient will contact our office for additional recommendations. Electronic Signature(s) Signed: 01/05/2020 1:42:27 PM By: Lenda Kelp PA-C Entered By: Lenda Kelp on 01/05/2020 13:42:26 -------------------------------------------------------------------------------- SuperBill Details Patient Name: Date of Service: [name], [name] 01/05/2020 Medical Record [id number] Patient Account Number:  [id number] Date of Birth/Sex: Treating RN: 30-Sep-1951 (69 y.o. [name] Primary Care Provider: Nicoletta Ba Other Clinician: Referring Provider: Treating Provider/Extender:[name], [name], [name] in Treatment: 206 Diagnosis Coding ICD-10 Codes Code Description 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 skin L97.221 Non-pressure chronic ulcer of left calf limited to breakdown of skin 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 of bilateral lower extremity I89.0 Lymphedema, not elsewhere classified E11.622 Type 2 diabetes mellitus with other skin ulcer 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, unspecified L03.116 Cellulitis of left lower limb Facility Procedures CPT4 Code Description: [id number] (Facility Use Only) [phone] - APPLY UNNA BOOT RT Modifier: Quantity: 1 CPT4 Code Description: [id number] (Facility Use Only) [id number] - APPLY MULTLAY COMPRS LWR LT LEG Modifier: 59 Quantity: 1 Physician Procedures CPT4: Description Modifier Quantity Code [id number] - WC PHYS LEVEL 3 - EST PT 1 ICD-10 Diagnosis Description 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 skin L97.221 Non-pressure chronic ulcer of left calf limited to  breakdown of skin 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 of bilateral lower extremity I89.0 Lymphedema, not elsewhere classified Electronic Signature(s) Signed: 01/05/2020 5:54:02 PM By: [name] RN, BSN Signed: 01/06/2020 5:38:12 PM By: Lenda Kelp PA-C Previous Signature: 01/05/2020 1:42:56 PM Version By: Lenda Kelp PA-C Entered By: [name] on 01/05/2020 17:18:32

## 2020-01-06 ENCOUNTER — Other Ambulatory Visit: Payer: Self-pay

## 2020-01-06 MED ORDER — INSULIN LISPRO PROT & LISPRO (75-25 MIX) 100 UNIT/ML KWIKPEN: PEN_INJECTOR | SUBCUTANEOUS | 3 refills | Status: DC

## 2020-01-10 ENCOUNTER — Ambulatory Visit: Payer: Medicare Other | Attending: Internal Medicine

## 2020-01-10 DIAGNOSIS — Z23 Encounter for immunization: Secondary | ICD-10-CM | POA: Insufficient documentation

## 2020-01-10 NOTE — Progress Notes (Signed)
   Covid-19 Vaccination Clinic  Name:  [name]    MRN: [id number] DOB: Jun 06, 1951  01/10/2020  [name] was observed post Covid-19 immunization for 15 minutes without incident. He was provided with Vaccine Information Sheet and instruction to access the V-Safe system.   [name] was instructed to call 911 with any severe reactions post vaccine: Marland Kitchen Difficulty breathing  . Swelling of face and throat  . A fast heartbeat  . A bad rash all over body  . Dizziness and weakness   Immunizations Administered    Name Date Dose VIS Date Route   Pfizer COVID-19 Vaccine 01/10/2020 10:07 AM 0.3 mL 10/15/2019 Intramuscular   Manufacturer: [employer], [employer]   Lot: [id number]   NDC: [address]-4

## 2020-01-12 ENCOUNTER — Other Ambulatory Visit: Payer: Self-pay

## 2020-01-12 ENCOUNTER — Encounter (HOSPITAL_BASED_OUTPATIENT_CLINIC_OR_DEPARTMENT_OTHER): Payer: Medicare Other | Admitting: Physician Assistant

## 2020-01-12 DIAGNOSIS — L97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 right calf limited to breakdown of skin: Secondary | ICD-10-CM | POA: Diagnosis not present

## 2020-01-12 DIAGNOSIS — M109 Gout, unspecified: Secondary | ICD-10-CM | POA: Diagnosis not present

## 2020-01-12 DIAGNOSIS — I10 Essential (primary) hypertension: Secondary | ICD-10-CM | POA: Diagnosis not present

## 2020-01-12 DIAGNOSIS — E11621 Type 2 diabetes mellitus with foot ulcer: Secondary | ICD-10-CM | POA: Diagnosis not present

## 2020-01-12 DIAGNOSIS — I890 Lymphedema, not elsewhere classified: Secondary | ICD-10-CM | POA: Diagnosis not present

## 2020-01-12 DIAGNOSIS — L97819 Non-pressure chronic ulcer of other part of right lower leg with unspecified severity: Secondary | ICD-10-CM | POA: Diagnosis not present

## 2020-01-12 DIAGNOSIS — L03116 Cellulitis of left lower limb: Secondary | ICD-10-CM | POA: Diagnosis not present

## 2020-01-12 DIAGNOSIS — E1140 Type 2 diabetes mellitus with diabetic neuropathy, unspecified: Secondary | ICD-10-CM | POA: Diagnosis not present

## 2020-01-12 DIAGNOSIS — L97529 Non-pressure chronic ulcer of other part of left foot with unspecified severity: Secondary | ICD-10-CM | POA: Diagnosis not present

## 2020-01-12 DIAGNOSIS — L97221 Non-pressure chronic ulcer of left calf limited to breakdown of skin: Secondary | ICD-10-CM | POA: Diagnosis not present

## 2020-01-12 DIAGNOSIS — E11622 Type 2 diabetes mellitus with other skin ulcer: Secondary | ICD-10-CM | POA: Diagnosis not present

## 2020-01-12 DIAGNOSIS — I87333 Chronic venous hypertension (idiopathic) with ulcer and inflammation of bilateral lower extremity: Secondary | ICD-10-CM | POA: Diagnosis not present

## 2020-01-12 NOTE — Progress Notes (Addendum)
RANON, COVEN ([id number]) Visit Report for 01/12/2020 Chief Complaint Document Details Patient Name: Date of Service: [name], [name] 01/12/2020 10:30 AM Medical Record [id number] Patient Account Number: [id number] Date of Birth/Sex: Treating RN: [birth year] (69 y.o. [name] Primary Care Provider: Nicoletta Ba Other Clinician: Referring Provider: Treating Provider/Extender:[name], [name], [name] in Treatment: 207 Information Obtained from: Patient Chief Complaint patient is here for evaluation venous/lymphedema weeping Electronic Signature(s) Signed: 01/12/2020 10:48:35 AM By: Lenda Kelp PA-C Entered By: Lenda Kelp on 01/12/2020 10:48:35 -------------------------------------------------------------------------------- HPI Details Patient Name: Date of Service: [name], [name] 01/12/2020 10:30 AM Medical Record [id number] Patient Account Number: [id number] Date of Birth/Sex: Treating RN: 05-10-1951 (69 y.o. [name] Primary Care Provider: Nicoletta Ba Other Clinician: Referring Provider: Treating Provider/Extender:[name], [name], [name] in Treatment: 207 History of Present Illness HPI Description: Referred by PCP for consultation. Patient has long standing history of BLE venous stasis, no prior ulcerations. At beginning of month, developed cellulitis and weeping. Received IM Rocephin followed by Keflex and resolved. Wears compression stocking, appr 6 months old. Not sure strength. No present drainage. 01/22/16 this is a patient who is a type II diabetic on insulin. 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 He tells me he religiously wears pressure stockings of uncertain strength. He was here with weeping edema about 8 months ago but did not have an open wound. Roughly a month ago he had a reopening on his bilateral legs. He is been using bandages and Neosporin. He does not  complain of pain. 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 Last BUN/creatinine I have is from 11/20/15 at 13 and 1.0 respectively 01/29/16; patient arrives today having tolerated the Profore wrap. He brought in his stockings and these are 18 mmHg stockings he bought from [place]. The compression here is likely inadequate. He does not complain of pain or excessive drainage she has no systemic symptoms. The wound on the right looks improved as does the one on the left although one on the left is more substantial with still tissue at risk below the actual wound area on the bilateral posterior calf 02/05/16; patient arrives with poor edema control. He states that we did put a 4 layer compression on it last week. No weight appear 5 this. 02/12/16; the area on the posterior right Has healed. The left Has a substantial wound that has necrotic surface eschar that requires a debridement with a curette. 02/16/16;the patient called or a Nurse visit secondary to increased swelling. He had been in earlier in the week with his right leg healed. He was transitioned to is on pressure stocking on the right leg with the only open wound on the left, a substantial area on the left posterior calf. Note he has a history of severe lower extremity edema, he has a history of chronic atrial fibrillation but not heart failure per my notes but I'll need to research this. He is not complaining of chest pain shortness of breath or orthopnea. The intake nurse noted blisters on the previously closed right leg 02/19/16; this is the patient's regular visit day. I see him on Friday with escalating edema new wounds on the right leg and clear signs of at least right ventricular heart failure. I increased his Lasix to 40 twice a day. He is returning currently in follow-up. States he  is noticed a decrease in that the edema 02/26/16  patient's legs have much less edema. There is nothing really open on the right leg. The left leg has improved condition of the large superficial wound on the posterior left leg 03/04/16; edema control is very much better. The patient's right leg wounds have healed. On the left leg he continues to have severe venous inflammation on the posterior aspect of the left leg. There is no tenderness and I don't think any of this is cellulitis. 03/11/16; patient's right leg is married healed and he is in his own stocking. The patient's left leg has deteriorated somewhat. There is a lot of erythema around the wound on the posterior left leg. There is also a significant rim of erythema posteriorly just above where the wrap would've ended there is a new wound in this location and a lot of tenderness. Can't rule out cellulitis in this area. 03/15/16; patient's right leg remains healed and he is in his own stocking. The patient's left leg is much better than last review. His major wound on the posterior aspect of his left Is almost fully epithelialized. He has 3 small injuries from the wraps. Really. Erythema seems a lot better on antibiotics 03/18/16; right leg remains healed and he is in his own stocking. The patient's left leg is much better. The area on the posterior aspect of the left calf is fully epithelialized. His 3 small injuries which were wrap injuries on the left are improved only one seems still open his erythema has resolved 03/25/16; patient's right leg remains healed and he is in his own stocking. There is no open area today on the left leg posterior leg is completely closed up. His wrap injuries at the superior aspect of his leg are also resolved. He looks as though he has some irritation on the dorsal ankle but this is fully epithelialized without evidence of infection. 03/28/16; we discharged this patient on Monday. Transitioned him into his own stocking. There were problems almost immediately with  uncontrolled swelling weeping edema multiple some of which have opened. He does not feel systemically unwell in particular no chest pain no shortness of breath and he does not feel 04/08/16; the edema is under better control with the Profore light wrap but he still has pitting edema. There is one large wound anteriorly 2 on the medial aspect of his left leg and 3 small areas on the superior posterior calf. Drainage is not excessive he is tolerating a Profore light well 04/15/16; put a Profore wrap on him last week. This is controlled is edema however he had a lot of pain on his left anterior foot most of his wounds are healed 04/22/16 once again the patient has denuded areas on the left anterior foot which he states are because his wrap slips up word. He saw his primary physician today is on Lasix 40 twice a day and states that he his weight is down 20 pounds over the last 3 months. 04/29/16: Much improved. left anterior foot much improved. He is now on Lasix 80 mg per day. Much improved edema control 05/06/16; I was hoping to be able to discharge him today however once again he has blisters at a low level of where the compression was placed last week mostly on his left lateral but also his left medial leg and a small area on the anterior part of the left foot. 05/09/16; apparently the patient went home after his appointment on 7/4 later in  the evening developing pain in his upper medial thigh together with subjective fever and chills although his temperature was not taken. The pain was so intense he felt he would probably have to call 911. However he then remembered that he had leftover doxycycline from a previous round of antibiotics and took these. By the next morning he felt a lot better. He called and spoke to one of our nurses and I approved doxycycline over the phone thinking that this was in relation to the wounds we had previously seen although they were definitely were not. The patient feels a  lot better old fever no chills he is still working. Blood sugars are reasonably controlled 05/13/16; patient is back in for review of his cellulitis on his anterior medial upper thigh. He is taking doxycycline this is a lot better. Culture I did of the nodular area on the dorsal aspect of his foot grew MRSA this also looks a lot better. 05/20/16; the patient is cellulitis on the medial upper thigh has resolved. All of his wound areas including the left anterior foot, areas on the medial aspect of the left calf and the lateral aspect of the calf at all resolved. He has a new blister on the left dorsal foot at the level of the fourth toe this was excised. No evidence of infection 05/27/16; patient continues to complain weeping edema. He has new blisterlike wounds on the left anterior lateral and posterior lateral calf at the top of his wrap levels. The area on his left anterior foot appears better. He is not complaining of fever, pain or pruritus in his feet. 05/30/16; the patient's blisters on his left anterior leg posterior calf all look improved. He did not increase the Lasix 100 mg as I suggested because he was going to run out of his 40 mg tablets. He is still having weeping edema of his toes 06/03/16; I renewed his Lasix at 80 mg once a day as he was about to run out when I last saw him. He is on 80 mg of Lasix now. I have asked him to cut down on the excessive amount of water he was drinking and asked him to drink according to his thirst mechanisms 06/12/2016 -- was seen 2 days ago and was supposed to wear his compression stockings at home but he is developed lymphedema and superficial blisters on the left lower extremity and hence came in for a review 06/24/16; the remaining wound is on his left anterior leg. He still has edema coming from between his toes. There is lymphedema here however his edema is generally better than when I last saw this. He has a history of atrial fibrillation but does  not have a known history of congestive heart failure nevertheless I think he probably has this at least on a diastolic basis. 07/01/16 I reviewed his echocardiogram from January 2017. This was essentially normal. He did not have LVH, EF of 55-60%. His right ventricular function was normal although he did have trivial tricuspid and pulmonic regurgitation. This is not audible on exam however. I increased his Lasix to do massive edema in his legs well above his knees I think in early July. He was also drinking an excessive amount of water at the time. 07/15/16; missed his appointment last week because of the Labor Day holiday on Monday. He could not get another appointment later in the week. Started to feel the wrap digging in superiorly so we remove the top half and the bottom  half of his wrap. He has extensive erythema and blistering superiorly in the left leg. Very tender. Very swollen. Edema in his foot with leaking edema fluid. He has not been systemically unwell 07/22/16; the area on the left leg laterally required some debridement. The medial wounds look more stable. His wrap injury wounds appear to have healed. Edema and his foot is better, weeping edema is also better. He tells me he is meeting with the supplier of the external compression pumps at work 08/05/16; the patient was on vacation last week in [facility]. His wrap is been on for an extended period of time. Also over the weekend he developed an extensive area of tender erythema across his anterior medial thigh. He took to doxycycline yesterday that he had leftover from a previous prescription. The patient complains of weeping edema coming out of his toes 08/08/16; I saw this patient on 10/2. He was tender across his anterior thigh. I put him on doxycycline. He returns today in follow-up. He does not have any open wounds on his lower leg, he still has edema weeping into his toes. 08/12/16; patient was seen back urgently today to  follow-up for his extensive left thigh cellulitis/erysipelas. He comes back with a lot less swelling and erythema pain is much better. I believe I gave him Augmentin and Cipro. His wrap was cut down as he stated a roll down his legs. He developed blistering above the level of the wrap that remained. He has 2 open blisters and 1 intact. 08/19/16; patient is been doing his primary doctor who is increased his Lasix from 40-80 once a day or 80 already has less edema. Cellulitis has remained improved in the left thigh. 2 open areas on the posterior left calf 08/26/16; he returns today having new open blisters on the anterior part of his left leg. He has his compression pumps but is not yet been shown how to use some vital representative from the supplier. 09/02/16 patient returns today with no open wounds on the left leg. Some maceration in his plantar toes 09/10/2016 -- Dr. Leanord Hawking had recently discharged him on 09/02/2016 and he has come right back with redness swelling and some open ulcers on his left lower extremity. He says this was caused by trying to apply his compression stockings and he's been unable to use this and has not been able to use his lymphedema pumps. He had some doxycycline leftover and he has started on this a few days ago. 09/16/16; there are no open wounds on his leg on the left and no evidence of cellulitis. 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 He does not complain of symptoms here. I am not clear use using his external compression pumps. 09/23/16; I have not seen this patient in 2 weeks. He canceled his appointment 10 days ago as he was going on vacation. He tells me that on Monday he noticed a large area on his posterior left leg which is been draining copiously and is reopened into a large wound. He is been using ABDs and the external part of his juxtalite, according to our nurse this was not on  properly. 10/07/16; Still a substantial area on the posterior left leg. Using silver alginate 10/14/16; in general better although there is still open area which looks healthy. Still using silver alginate. He reminds me that this happen before he left for [facility]. Today while he was showering in the morning.  He had been using his juxtalite's 10/21/16; the area on his posterior left leg is fully epithelialized. However he arrives today with a large area of tender erythema in his medial and posterior left thigh just above the knee. I have marked the area. Once again he is reluctant to consider hospitalization. I treated him with oral antibiotics in the past for a similar situation with resolution I think with doxycycline however this area it seems more extensive to me. He is not complaining of fever but does have chills and says states he is thirsty. His blood sugar today was in the 140s at home 10/25/16 the area on his posterior left leg is fully epithelialized although there is still some weeping edema. The large area of tenderness and erythema in his medial and posterior left thigh is a lot less tender although there is still a lot of swelling in this thigh. He states he feels a lot better. He is on doxycycline and Augmentin that I started last week. This will continued until Tuesday, December 26. I have ordered a duplex ultrasound of the left thigh rule out DVT whether there is an abscess something that would need to be drained I would also like to know. 11/01/16; he still has weeping edema from a not fully epithelialized area on his left posterior calf. Most of the rest of this looks a lot better. He has completed his antibiotics. His thigh is a lot better. Duplex ultrasound did not show a DVT in the thigh 11/08/16; he comes in today with more Denuded surface epithelium from the posterior aspect of his calf. There is no real evidence of cellulitis. The superior aspect of his wrap appears to  have put quite an indentation in his leg just below the knee and this may have contributed. He does not complain of pain or fever. We have been using silver alginate as the primary dressing. The area of cellulitis in the right thigh has totally resolved. He has been using his compression stockings once a week 11/15/16; the patient arrives today with more loss of epithelium from the posterior aspect of his left calf. He now has a fairly substantial wound in this area. The reason behind this deterioration isn't exactly clear although his edema is not well controlled. He states he feels he is generally more swollen systemically. He is not complaining of chest pain shortness of breath fever. Tells me he has an appointment with his primary physician in early February. He is on 80 mg of oral Lasix a day. He claims compliance with the external compression pumps. He is not having any pain in his legs similar to what he has with his recurrent cellulitis 11/22/16; the patient arrives a follow-up of his large area on his left lateral calf. This looks somewhat better today. He came in earlier in the week for a dressing change since I saw him a week ago. He is not complaining of any pain no shortness of breath no chest pain 11/28/16; the patient arrives for follow-up of his large area on the left lateral calf this does not look better. In fact it is larger weeping edema. The surface of the wound does not look too bad. We have been using silver alginate although I'm not certain that this is a dressing issue. 12/05/16; again the patient follows up for a large wound on the left lateral and left posterior calf this does not look better. There continues to be weeping edema necrotic surface tissue. More worrisome than  this once again there is erythema below the wound involving the distal Achilles and heel suggestive of cellulitis. He is on his feet working most of the day of this is not going well. We are changing his  dressing twice a week to facilitate the drainage. 12/12/16; not much change in the overall dimensions of the large area on the left posterior calf. This is very inflamed looking. I gave him an. Doxycycline last week does not really seem to have helped. He found the wrap very painful indeed it seems to of dog into his legs superiorly and perhaps around the heel. He came in early today because the drainage had soaked through his dressings. 12/19/16- patient arrives for follow-up evaluation of his left lower extremity ulcers. He states that he is using his lymphedema pumps once daily when there is "no drainage". He admits to not using his lipedema pumps while under current treatment. His blood sugars have been consistently between 150-200. 12/26/16; the patient is not using his compression pumps at home because of the wetness on his feet. I've advised him that I think it's important for him to use this daily. He finds his feet too wet, he can put a plastic bag over his legs while he is in the pumps. Otherwise I think will be in a vicious circle. We are using silver alginate to the major area on his left posterior calf 01/02/17; the patient's posterior left leg has further of all into 3 open wounds. All of them covered with a necrotic surface. He claims to be using his compression pumps once a day. His edema control is marginal. Continue with silver alginate 01/10/17; the patient's left posterior leg actually looks somewhat better. There is less edema, less erythema. Still has 3 open areas covered with a necrotic surface requiring debridement. He claims to be using his compression pumps once a day his edema control is better 01/17/17; the patient's left posterior calf look better last week when I saw him and his wrap was changed 2 days ago. He has noted increasing pain in the left heel and arrives today with much larger wounds extensive erythema extending down into the entire heel area especially tender  medially. He is not systemically unwell CBGs have been controlled no fever. Our intake nurse showed me limegreen drainage on his AVD pads. 01/24/17; his usual this patient responds nicely to antibiotics last week giving him Levaquin for presumed Pseudomonas. The whole entire posterior part of his leg is much better much less inflamed and in the case of his Achilles heel area much less tender. He has also had some epithelialization posteriorly there are still open areas here and still draining but overall considerably better 01/31/17- He has continue to tolerate the compression wraps. he states that he continues to use the lymphedema pumps daily, and can increase to twice daily on the weekends. He is voicing no complaints or concerns regarding his LLE ulcers 02/07/17-he is here for follow-up evaluation. He states that he noted some erythema to the left medial and anterior thigh, which he states is new as of yesterday. He is concerned about recurrent cellulitis. He states his blood sugars have been slightly elevated, this morning in the 180s 02/14/17; he is here for follow-up evaluation. When he was last here there was erythema superiorly from his posterior wound in his anterior thigh. He was prescribed Levaquin however a culture of the wound surface grew MRSA over the phone I changed him to doxycycline on Monday and things  seem to be a lot better. 02/24/17; patient missed his appointment on Friday therefore we changed his nurse visit into a physician visit today. Still using silver alginate on the large area of the posterior left thigh. He isn't new area on the dorsal left second toe 03/03/17; actually better today although he admits he has not used his external compression pumps in the last 2 days or so because of work responsibilities over the weekend. 03/10/17; continued improvement. External compression pumps once a day almost all of his wounds have closed on the posterior left calf. Better edema  control 03/17/17; in general improved. He still has 3 small open areas on the lateral aspect of his left leg however most of the area on the posterior part of his leg is epithelialized. He has better edema control. He has an ABD pad under his stocking on the right anterior lower leg although he did not let us look at that today. 03/24/17; patient arrives back in clinic today with no open areas however there are areas on the posterior left calf and anterior left calf that are less than 100% epithelialized. His edema is well controlled in the left lower leg. There is some pitting edema probably lymphedema in the left upper thigh. He uses compression pumps at home once per day. I tried to get him to do this twice a day although he is very reticent. 04/01/2017 -- for the last 2 days he's had significant redness, tenderness and weeping and came in for an urgent visit today. 04/07/17; patient still has 6 more days of doxycycline. He was seen by Dr. Con Memos last Wednesday for cellulitis involving the posterior aspect, lateral aspect of his Involving his heel. For the most part he is better there is less erythema and less weeping. He has been on his feet for 12 hours 2 over the weekend. Using his compression pumps once a day 04/14/17 arrives today with continued improvement. Only one area on the posterior left calf that is not fully epithelialized. He has intense bilateral venous inflammation associated with his chronic venous insufficiency disease and secondary lymphedema. We have been using silver alginate to the left posterior calf wound In passing he tells [place] today that the right leg but we have not seen in quite some time has an open area on it but he doesn't want [place] to look at this today states he will show this to [place] next week. 04/21/17; there is no open area on his left leg although he still reports some weeping edema. He showed [place] his right leg today which is the first time we've seen this leg in a  long time. He has a large area of open wound on the right leg anteriorly healthy granulation. Quite a bit of swelling in the right leg and some degree of venous inflammation. He told us about the right leg in passing last week but states that deterioration in the right leg really only happened over the weekend 04/28/17; there is no open area on the left leg although there is an irritated part on the posterior which is like a wrap injury. The wound on the right leg which was new from last week at least to [place] is a lot better. 05/05/17; still no open area on the left leg. Patient is using his new compression stocking which seems to be doing a good job of controlling the edema. He states he is using his compression pumps once per day. The right leg still has an  open wound although it is better in terms of surface area. Required debridement. A lot of pain in the posterior right Achilles marked tenderness. Usually this type of presentation this patient gives concern for an active cellulitis 05/12/17; patient arrives today with his major wound from last week on the right lateral leg somewhat better. Still requiring debridement. He was using his compression stocking on the left leg however that is reopened with superficial wounds anteriorly he did not have an open wound on this leg previously. He is still using his juxta light's once daily at night. He cannot find the time to do this in the morning as he has to be at work by 7 AM 05/19/17; right lateral leg wound looks improved. No debridement required. The concerning area is on the left posterior leg which appears to almost have a subcutaneous hemorrhagic component to it. We've been using silver alginate to all the wounds 05/26/17; the right lateral leg wound continues to look improved. However the area on the left posterior calf is a tightly adherent surface. [place] using silver alginate. Because of the weeping edema in his legs there is very little  good alternatives. 06/02/17; the patient left here last week looking quite good. Major wound on the left posterior calf and a small one on the right lateral calf. Both of these look satisfactory. He tells me that by Wednesday he had noted increased pain in the left leg and drainage. He called on Thursday and Friday to get an appointment here but we were blocked. He did not go to urgent care or his primary physician. He thinks he had a fever on Thursday but did not actually take his temperature. He has not been using his compression pumps on the left leg because of pain. I advised him to go to the emergency room today for IV antibiotics for stents of left leg cellulitis but he has refused I have asked him to take 2 days off work to keep his leg elevated and he has refused this as well. In view of this I'm going to call him and Augmentin and doxycycline. 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 However after a detailed discussion regarding the risk benefits and alternatives he refuses to get admitted to the hospital. With no other recourse I will continue him on oral antibiotics as before and hopefully he'll have his infectious disease consultation this week. 06/16/2017 -- the patient was seen today by the nurse practitioner at infectious disease Ms. Dixon. 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 He has also been advise Lotrimin cream locally. she also had wise IV antibiotics for his condition if it flares up 06/23/17; patient arrives today with drainage bilaterally although the  remaining wound on the left posterior calf after cleaning up today "highlighter yellow drainage" did not look too bad. Unfortunately he has had breakdown on the right anterior leg [previously this leg had not been open and he is using a black stocking] he went to see infectious disease and is been put on clindamycin 150 mg daily, I  did not verify the dose although I'm not familiar with using clindamycin in this dosing range, perhaps for prophylaxisoo 06/27/17; I brought this patient back today to follow-up on the wound deterioration on the right lower leg together with surrounding cellulitis. I started him on doxycycline 4 days ago. This area looks better however he comes in today with intense cellulitis on the medial part of his left thigh. This is not have a wound in this area. Extremely tender. We've been using silver alginate to the wounds on the right lower leg left lower leg with bilateral 4 layer compression he is using his external compression pumps once a day 07/04/17; patient's left medial thigh cellulitis looks better. He has not been using his compression pumps as his insert said it was contraindicated with cellulitis. His right leg continues to make improvements all the wounds are still open. We only have one remaining wound on the left posterior calf. Using silver alginate to all open areas. He is on doxycycline which I started a week ago and should be finishing I gave him Augmentin after Thursday's visit for the severe cellulitis on the left medial thigh which fortunately looks better 07/14/17; the patient's left medial thigh cellulitis has resolved. The cellulitis in his right lower calf on the right also looks better. All of his wounds are stable to improved we've been using silver alginate he has completed the antibiotics I have given him. He has clindamycin 150 mg once a day prescribed by infectious disease for prophylaxis, I've advised him to start this now. We have been using  bilateral Unna boots over silver alginate to the wound areas 07/21/17; the patient is been to see infectious disease who noted his recurrent problems with cellulitis. He was not able to tolerate prophylactic clindamycin therefore he is on amoxicillin 500 twice a day. He also had a second daily dose of Lasix added By Dr. Oneta RackMcKeown but he is not taking this. Nor is he being completely compliant with his compression pumps a especially not this week. He has 2 remaining wounds one on the right posterior lateral lower leg and one on the left posterior medial lower leg. 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 The patient has Unna boots bilaterally. 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 He has bilateral Unna boots for compression and silver alginate to his wounds. Arrives today with his legs looking as good as I have seen him in quite some time. Not surprisingly his wounds look better as well with improvement on the right lateral leg venous insufficiency wound and also the left medial leg. He is still using the compression pumps once a day 08/11/17; both legs appear to be doing better wounds on the right lateral and left medial legs look better. Skin on the right leg quite good. He is been using silver alginate as the primary dressing. I'm going to use Anasept gel calcium alginate and maintain all the secondary dressings 08/18/17; the patient continues to actually do quite well. The area on his right lateral leg is just about closed the left medial also looks better although it is still moist in this area. His edema is well controlled we have been using Anasept gel with calcium alginate and the usual secondary dressings, 4 layer compression and once daily  use of his compression  pumps "always been able to manage 09/01/17; the patient continues to do reasonably well in spite of his trip to [place]. The area on the right lateral leg is epithelialized. Left is much better but still open. He has more edema and more chronic erythema on the left leg [venous inflammation] 09/08/17; he arrives today with no open wound on the right lateral leg and decently controlled edema. Unfortunately his left leg is not nearly as in his good situation as last week.he apparently had increasing edema starting on Saturday. He edema soaked through into his foot so used a plastic bag to walk around his home. The area on the medial right leg which was his open area is about the same however he has lost surface epithelium on the left lateral which is new and he has significant pain in the Achilles area of the left foot. He is already on amoxicillin chronically for prophylaxis of cellulitis in the left leg 09/15/17; he is completed a week of doxycycline and the cellulitis in the left posterior leg and Achilles area is as usual improved. He still has a lot of edema and fluid soaking through his dressings. There is no open wound on the right leg. He saw infectious disease NP today 09/22/17;As usual 1 we transition him from our compression wraps to his stockings things did not go well. He has several small open areas on the right leg. He states this was caused by the compression wrap on his skin although he did not wear this with the stockings over them. He has several superficial areas on the left leg medially laterally posteriorly. He does not have any evidence of active cellulitis especially involving the left Achilles The patient is traveling from [facility] Saturday going to [facility]. He states he isn't attempting to get an appointment with a heel objects wound center there to change his dressings. I am not completely certain whether this will work 10/06/17; the patient came in on Friday for a  nurse visit and the nurse reported that his legs actually look quite good. He arrives in clinic today for his regular follow-up visit. He has a new wound on his left third toe over the PIP probably caused by friction with his footwear. He has small areas on the left leg and a very superficial but epithelialized area on the right anterior lateral lower leg. Other than that his legs look as good as I've seen him in quite some time. We have been using silver alginate Review of systems; no chest pain no shortness of breath other than this a 10 point review of systems negative 10/20/17; seen by Dr. Meyer Russel last week. He had taken some antibiotics [doxycycline] that he had left over. Dr. Meyer Russel thought he had candida infection and declined to give him further antibiotics. He has a small wound remaining on the right lateral leg several areas on the left leg including a larger area on the left posterior several left medial and anterior and a small wound on the left lateral. The area on the left dorsal third toe looks a lot better. ROS; Gen.; no fever, respiratory no cough no sputum Cardiac no chest pain other than this 10 point review of system is negative 10/30/17; patient arrives today having fallen in the bathtub 3 days ago. It took him a while to get up. He has pain and maceration in the wounds on his left leg which have deteriorated. He has not been using his pumps  he also has some maceration on the right lateral leg. 11/03/17; patient continues to have weeping edema especially in the left leg. This saturates his dressings which were just put on on 12/27. As usual the doxycycline seems to take care of the cellulitis on his lower leg. He is not complaining of fever, chills, or other systemic symptoms. He states his leg feels a lot better on the doxycycline I gave him empirically. He also apparently gets injections at his primary doctor's officeo Rocephin for cellulitis prophylaxis. I didn't ask him  about his compression pump compliance today I think that's probably marginal. Arrives in the clinic with all of his dressings primary and secondary macerated full of fluid and he has bilateral edema 11/10/17; the patient's right leg looks some better although there is still a cluster of wounds on the right lateral. The left leg is inflamed with almost circumferential skin loss medially to laterally although we are still maintaining anteriorly. He does not have overt cellulitis there is a lot of drainage. He is not using compression pumps. We have been using silver alginate to the wound areas, there are not a lot of options here 11/17/17; the patient's right leg continues to be stable although there is still open wounds, better than last week. The inflammation in the left leg is better. Still loss of surface layer epithelium especially posteriorly. There is no overt cellulitis in the amount of edema and his left leg is really quite good, tells me he is using his compression pumps once a day. 11/24/17; patient's right leg has a small superficial wound laterally this continues to improve. The inflammation in the left leg is still improving however we have continuous surface layer epithelial loss posteriorly. There is no overt cellulitis in the amount of edema in both legs is really quite good. He states he is using his compression pumps on the left leg once a day for 5 out of 7 days 12/01/17; very small superficial areas on the right lateral leg continue to improve. Edema control in both legs is better today. He has continued loss of surface epithelialization and left posterior calf although I think this is better. We have been using silver alginate with large number of absorptive secondary dressings 4 layer on the left Unna boot on the right at his request. He tells me he is using his compression pumps once a day 12/08/17; he has no open area on the right leg is edema control is good here. On the left leg  however he has marked erythema and tenderness breakdown of skin. He has what appears to be a wrap injury just distal to the popliteal fossa. 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 There was swelling erythema and breakdown in the right leg. He has much worse skin breakdown in the left leg as well multiple open areas medially and posteriorly as well as laterally. He tells me he has been using his compression pumps but tells me he feels that the drainage out of his leg is worse when he uses a compression pumps. To be fair to him he is been saying this for a while however I don't know that I have really been listening to this. I wonder if  the compression pumps are working properly 12/22/17;. Once again he arrives with severe erythema, weeping edema from the left greater than right leg. Noncompliance with compression pumps. New this visit he is complaining of pain on the lateral aspect of the right leg and the medial aspect of his right thigh. He apparently saw his cardiologist Dr. Debara Pickett who was ordered an echocardiogram area and I think this is a step in the right direction 12/25/17; started his doxycycline Monday night. There is still intense erythema of the right leg especially in the anterior thigh although there is less tenderness. The erythema around the wound on the right lateral calf also is less tender. He still complaining of pain in the left heel. His wounds are about the same right lateral left medial left lateral. Superficial but certainly not close to closure. He denies being systemically unwell no fever chills no abdominal pain no diarrhea 12/29/17; back in follow-up of his extensive right calf and right thigh  cellulitis. I added amoxicillin to cover possible doxycycline resistant strep. This seems to of done the trick he is in much less pain there is much less erythema and swelling. He has his echocardiogram at 11:00 this morning. X-ray of the left heel was also negative. 01/05/18; the patient arrived with his edema under much better control. Now that he is retired he is able to use his compression pumps daily and sometimes twice a day per the patient. He has a wound on the right leg the lateral wound looks better. Area on the left leg also looks a lot better. He has no evidence of cellulitis in his bilateral thighs I had a quick peak at his echocardiogram. He is in normal ejection fraction and normal left ventricular function. He has moderate pulmonary hypertension moderately reduced right ventricular function. One would have to wonder about chronic sleep apnea although he says he doesn't snore. He'll review the echocardiogram with his cardiologist. 01/12/18; the patient arrives with the edema in both legs under exemplary control. He is using his compression pumps daily and sometimes twice daily. His wound on the right lateral leg is just about closed. He still has some weeping areas on the posterior left calf and lateral left calf although everything is just about closed here as well. I have spoken with Jeri Lager who is the patient's nurse practitioner and infectious disease. She was concerned that the patient had not understood that the parenteral penicillin injections he was receiving for cellulitis prophylaxis was actually benefiting him. I don't think the patient actually saw that I would tend to agree we were certainly dealing with less infections although he had a serious one last month. 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 He is healed. He'll be placed in juxtalite compression wraps and increase his lymphedema pumps to twice daily. We will follow up  again next week to ensure there are no issues with the new regiment. 01/20/18-he is here for evaluation of bilateral lower extremity weeping edema. Yesterday he was placed in compression wrap to the right lower extremity and compression stocking to left lower shrubbery. He states he uses lymphedema pumps last night and again this morning and noted a blister to the left lower extremity. On exam he was noted to have drainage to the right lower extremity. He will be placed in Unna boots bilaterally and follow-up next week 01/26/18; patient was actually discharged a week ago to his own juxta light stockings only to return the next  day with bilateral lower extremity weeping edema.he was placed in bilateral Unna boots. He arrives today with pain in the back of his left leg. There is no open area on the right leg however there is a linear/wrap injury on the left leg and weeping edema on the left leg posteriorly. I spoke with infectious disease about 10 days ago. They were disappointed that the patient elected to discontinue prophylactic intramuscular penicillin shots as they felt it was particularly beneficial in reducing the frequency of his cellulitis. I discussed this with the patient today. He does not share this view. He'll definitely need antibiotics today. Finally he is traveling to [place] and trauma leaving this Saturday and returning a week later and he does not travel with his pumps. He is going by car 01/30/18; patient was seen 4 days ago and brought back in today for review of cellulitis in the left leg posteriorly. I put him on amoxicillin this really hasn't helped as much as I might like. He is also worried because he is traveling to [facility] trauma by car. Finally we will be rewrapping him. There is no open area on the right leg over his left leg has multiple weeping areas as usual 02/09/18; The same wrap on for 10 days. He did not pick up the last doxycycline I prescribed for him. He  apparently took 4 days worth he already had. There is nothing open on his right leg and the edema control is really quite good. He's had damage in the left leg medially and laterally especially probably related to the prolonged use of Unna boots 02/12/18; the patient arrived in clinic today for a nurse visit/wrap change. He complained of a lot of pain in the left posterior calf. He is taking doxycycline that I previously prescribed for him. Unfortunately even though he used his stockings and apparently used to compression pumps twice a day he has weeping edema coming out of the lateral part of his right leg. This is coming from the lower anterior lateral skin area. 02/16/18; the patient has finished his doxycycline and will finish the amoxicillin 2 days. The area of cellulitis in the left calf posteriorly has resolved. He is no longer having any pain. He tells me he is using his compression pumps at least once a day sometimes twice. 02/23/18; the patient finished his doxycycline and Amoxil last week. On Friday he noticed a small erythematous circle about the size of a quarter on the left lower leg just above his ankle. This rapidly expanded and he now has erythema on the lateral and posterior part of the thigh. This is bright red. Also has an area on the dorsal foot just above his toes and a tender area just below the left popliteal fossa. He came off his prophylactic penicillin injections at his own insistence one or 2 months ago. This is obviously deteriorated since then 03/02/18; patient is on doxycycline and Amoxil. Culture I did last week of the weeping area on the back of his left calf grew group B strep. I have therefore renewed the amoxicillin 500 3 times a day for a further week. He has not been systemically unwell. Still complaining of an area of discomfort right under his left popliteal fossa. There is no open wound on the right leg. He tells me that he is using his pumps twice a day on  most days 03/09/18; patient arrives in clinic today completing his amoxicillin today. The cellulitis on his left leg is better. Furthermore  he tells me that he had intramuscular penicillin shots that his primary care office today. However he also states that the wrap on his right leg fell down shortly after leaving clinic last week. He developed a large blister that was present when he came in for a nurse visit later in the week and then he developed intense discomfort around this area.He tells me he is using his compression pumps 03/16/18; the patient has completed his doxycycline. The infectious part of this/cellulitis in the left heel area left popliteal area is a lot better. He has 2 open areas on the right calf. Still areas on the left calf but this is a lot better as well. 03/24/18; the patient arrives complaining of pain in the left popliteal area again. He thinks some of this is wrap injury. He has no open area on the right leg and really no open area on the left calf either except for the popliteal area. He claims to be compliant with the compression pumps 03/31/18; I gave him doxycycline last week because of cellulitis in the left popliteal area. This is a lot better although the surface epithelium is denuded off and response to this. He arrives today with uncontrolled edema in the right calf area as well as a fingernail injury in the right lateral calf. There is only a few open areas on the left 04/06/18; I gave him amoxicillin doxycycline over the last 2 weeks that the amoxicillin should be completing currently. He is not complaining of any pain or systemic symptoms. The only open areas see has is on the right lateral lower leg paradoxically I cannot see anything on the left lower leg. He tells me he is using his compression pumps twice a day on most days. Silver alginate to the wounds that are open under 4 layer compression 04/13/18; he completed antibiotics and has no new complaints. Using  his compression pumps. Silver alginate that anything that's opened 04/20/18; he is using his compression pumps religiously. Silver alginate 4 layer compression anything that's opened. He comes in today with no open wounds on the left leg but 3 on the right including a new one posteriorly. He has 2 on the right lateral and one on the right posterior. He likes Unna boots on the right leg for reasons that aren't really clear we had the usual 4 layer compression on the left. It may be necessary to move to the 4 layer compression on the right however for now I left them in the Unna boots 04/27/18; he is using his compression pumps at least once a day. He has still the wounds on the right lateral calf. The area right posteriorly has closed. He does not have an open wound on the left under 4 layer compression however on the dorsal left foot just proximal to the toes and the left third toe 2 small open areas were identified 05/11/18; he has not uses compression pumps. The areas on the right lateral calf have coalesced into one large wound necrotic surface. On the left side he has one small wound anteriorly however the edema is now weeping out of a large part of his left leg. He says he wasn't using his pumps because of the weeping fluid. I explained to him that this is the time he needs to pump more 05/18/18; patient states he is using his compression pumps twice a day. The area on the right lateral large wound albeit superficial. On the left side he has innumerable number of  small new wounds on the left calf particularly laterally but several anteriorly and medially. All these appear to have healthy granulated base these look like the remnants of blisters however they occurred under compression. The patient arrives in clinic today with his legs somewhat better. There is certainly less edema, less multiple open areas on the left calf and the right anterior leg looks somewhat better as well superficial and a  little smaller. However he relates pain and erythema over the last 3-4 days in the thigh and I looked at this today. He has not been systemically unwell no fever no chills no change in blood sugar values 05/25/18; comes in today in a better state. The severe cellulitis on his left leg seems better with the Keflex. Not as tender. He has not been systemically unwell Hard to find an open wound on the left lower leg using his compression pumps twice a day The confluent wounds on his right lateral calf somewhat better looking. These will ultimately need debridement I didn't do this today. 06/01/18; the severe cellulitis on the left anterior thigh has resolved and he is completed his Keflex. There is no open wound on the left leg however there is a superficial excoriation at the base of the third toe dorsally. Skin on the bottom of his left foot is macerated looking. The left the wounds on the lateral right leg actually looks some better although he did require debridement of the top half of this wound area with an open curet 06/09/18 on evaluation today patient appears to be doing poorly in regard to his right lower extremity in particular this appears to likely be infected he has very thick purulent discharge along with a bright green tent to the discharge. This makes me concerned about the possibility of pseudomonas. He's also having increased discomfort at this point on evaluation. Fortunately there does not appear to be any evidence of infection spreading to the other location at this time. 06/16/18 on evaluation today patient appears to actually be doing fairly well. His ulcer has actually diminished in size quite significantly at this point which is good news. Nonetheless he still does have some evidence of infection he did see infectious disease this morning before coming here for his appointment. I did review the results of their evaluation and their note today. They did actually have him  discontinue the Cipro and initiate treatment with linezolid at this time. He is doing this for the next seven days and they recommended a follow-up in four months with them. He is the keep a log of the need for intermittent antibiotic therapy between now and when he falls back up with infectious disease. This will help them gaze what exactly they need to do to try and help them out. 06/23/18; the patient arrives today with no open wounds on the left leg and left third toe healed. He is been using his compression pumps twice a day. On the right lateral leg he still has a sizable wound but this is a lot better than last time I saw this. 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 Has been using silver alginate under 4 layer compression 06/30/18; the only open wound he has is on the right lateral leg and this looks healthy. No debridement is required. We have been using silver alginate. He does not have an open wound on the left leg. There  is apparently some drainage from the dorsal proximal third toe on the left although I see no open wound here. 07/03/18 on evaluation today patient was actually here just for a nurse visit rapid change. However when he was here on Wednesday for his rat change due to having been healed on the left and then developing blisters we initiated the wrap again knowing that he would be back today for us to reevaluate and see were at. Unfortunately he has developed some cellulitis into the proximal portion of his right lower extremity even into the region of his thigh. 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 He was placed on one as what at that point. Nonetheless he is done with that and has been tolerating it well otherwise. Doxycycline which in the past really did not seem to be effective for him. Nonetheless I think the best  option may be for us to definitely reinitiate the antibiotics for a longer period of time. 07/07/18; since I last saw this patient a week ago he has had a difficult time. At that point he did not have an open wound on his left leg. We transitioned him into juxta light stockings. He was apparently in the clinic the next day with blisters on the left lateral and left medial lower calf. He also had weeping edema fluid. He was put back into a compression wrap. He was also in the clinic on Friday with intense erythema in his right thigh. Per the patient he was started on Bactrim however that didn't work at all in terms of relieving his pain and swelling. He has taken 3 doxycycline that he had left over from last time and that seems to of helped. He has blistering on the right thigh as well. 07/14/18; the erythema on his right thigh has gotten better with doxycycline that he is finishing. 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 Presumably this cellulitis in the thigh was not related to that although I think this is a bit concerning going forward. He still has an area on the right lateral calf the blister on the right medial calf just below the knee that was discussed above. On the left 2 small open areas left medial and left lateral. Edema control is adequate. He is using his compression pumps twice a day 07/20/18; continued improvement in the condition of both legs especially the edema in his bilateral thighs. He tells me he is been losing weight through a combination of diet and exercise. He is using his compression pumps twice a day. So overall she made to the remaining wounds 07/27/2018; continued improvement in condition of both legs. His edema is well controlled. The area on the right lateral leg is just about closed he had one blisters show up on the medial left upper calf. We have him in 4 layer compression. He is going on  a 10-day trip to [place], [place] and [place]. He will be driving. He wants to wear Unna boots because of the lessening amount of constriction. He will not use compression pumps while he is away 08/05/18 on evaluation today patient actually appears to be doing decently well all things considered in regard to his bilateral lower extremities. The worst ulcer is actually only posterior aspect of his left lower extremity with a four layer compression wrap cut into his leg a couple weeks  back. He did have a trip and actually had [employer] for the trip that he is worn since he was last here. Nonetheless he feels like the [employer] actually do better for him his swelling is up a little bit but he also with his trip was not taking his Lasix on a regular set schedule like he was supposed to be. He states that obviously the reason being that he cannot drive and keep going without having to urinate too frequently which makes it difficult. He did not have his pumps with him while he was away either which I think also maybe playing a role here too. 08/13/2018; the patient only has a small open wound on the right lateral calf which is a big improvement in the last month or 2. He also has the area posteriorly just below the posterior fossa on the left which I think was a wrap injury from several weeks ago. He has no current evidence of cellulitis. He tells me he is back into his compression pumps twice a day. He also tells me that while he was at the [place] somebody stole a section of his extremitease stockings 08/20/2018; back in the clinic with a much improved state. He only has small areas on the right lateral mid calf which is just about healed. This was is more substantial area for quite a prolonged period of time. He has a small open area on the left anterior tibia. The area on the posterior calf just below the popliteal fossa is closed today. He is using his compression pumps twice a  day 08/28/2018; patient has no open wound on the right leg. He has a smattering of open areas on the calf with some weeping lymphedema. More problematically than that it looks as though his wraps of slipped down in his usual he has very angry upper area of edema just below the right medial knee and on the right lateral calf. He has no open area on his feet. The patient is traveling to [facility] next week. I will send him in an antibiotic. We will continue to wrap the right leg. We ordered extremitease stockings for him last week and I plan to transition the right leg to a stocking when he gets home which will be in 10 days time. As usual he is very reluctant to take his pumps with him when he travels 09/07/2018; patient returns from [facility]. He shows me a picture of his left leg in the mid part of his trip last week with intense fire engine erythema. The picture look bad enough I would have considered sending him to the hospital. Instead he went to the wound care center in [facility]. They did not prescribe him antibiotics but he did take some doxycycline he had leftover from a previous visit. I had given him trimethoprim sulfamethoxazole before he left this did not work according to the patient. This is resulted in some improvement fortunately. He comes back with a large wound on the left posterior calf. Smaller area on the left anterior tibia. Denuded blisters on the dorsal left foot over his toes. Does not have much in the way of wounds on the right leg although he does have a very tender area on the right posterior area just below the popliteal fossa also suggestive of infection. He promises me he is back on his pumps twice a day 09/15/2018; the intense cellulitis in his left lower calf is a lot better.  The wound area on the posterior left calf is also so better. However he has reasonably extensive wounds on the dorsal aspect of his second and third toes and the proximal  foot just at the base of the toes. There is nothing open on the right leg 09/22/2018; the patient has excellent edema control in his legs bilaterally. He is using his external compression pumps twice a day. He has no open area on the right leg and only the areas in the left foot dorsally second and third toe area on the left side. He does not have any signs of active cellulitis. 10/06/2018; the patient has good edema control bilaterally. He has no open wound on the right leg. There is a blister in the posterior aspect of his left calf that we had to deal with today. He is using his compression pumps twice a day. There is no signs of active cellulitis. We have been using silver alginate to the wound areas. He still has vulnerable areas on the base of his left first second toes dorsally He has a his extremities stockings and we are going to transition him today into the stocking on the right leg. He is cautioned that he will need to continue to use the compression pumps twice a day. If he notices uncontrolled edema in the right leg he may need to go to 3 times a day. 10/13/2018; the patient came in for a nurse check on Friday he has a large flaccid blister on the right medial calf just below the knee. We unroofed this. He has this and a new area underneath the posterior mid calf which was undoubtedly a blister as well. He also has several small areas on the right which is the area we put his extremities stocking on. 10/19/2018; the patient went to see infectious disease this morning I am not sure if that was a routine follow-up in any case the doxycycline I had given him was discontinued and started on linezolid. He has not started this. It is easy to look at his left calf and the inflammation and think this is cellulitis however he is very tender in the tissue just below the popliteal fossa and I have no doubt that there is infection going on here. He states the problem he is having is that with the  compression pumps the edema goes down and then starts walking the wrap falls down. We will see if we can adhere this. He has 1 or 2 minuscule open areas on the right still areas that are weeping on the posterior left calf, the base of his left second and third toes 10/26/18; back today in clinic with quite of skin breakdown in his left anterior leg. This may have been infection the area below the popliteal fossa seems a lot better however tremendous epithelial loss on the left anterior mid tibia area over quite inexpensive tissue. He has 2 blisters on the right side but no other open wound here. 10/29/2018; came in urgently to see [place] today and we worked him in for review. He states that the 4 layer compression on the right leg caused pain he had to cut it down to roughly his mid calf this caused swelling above the wrap and he has blisters and skin breakdown today. As a result of the pain he has not been using his pumps. Both legs are a lot more edematous and there is a lot of weeping fluid. 11/02/18; arrives in clinic with continued difficulties in  the right leg> left. Leg is swollen and painful. multiple skin blisters and new open areas especially laterally. He has not been using his pumps on the right leg. 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 He is not systemically unwell. 11/09/2018; the patient claims he is being compliant with his pumps. He is finished the doxycycline I gave him last week. Culture I did of the wound on the right lateral leg showed a few very resistant methicillin staph aureus. This was resistant to doxycycline. Nevertheless he states the pain in the leg is a lot better which makes me wonder if the cultured organism was not really what was causing the problem nevertheless this is a very dangerous organism to be culturing out of any wound. His right leg is still a lot larger than the left. He is using an [employer] boot on this area,  he blames a 4-layer compression for causing the original skin breakdown which I doubt is true however I cannot talk him out of it. We have been using silver alginate to all of these areas which were initially blisters 11/16/2018; patient is being compliant with his external compression pumps at twice a day. Miraculously he arrives in clinic today with absolutely no open wounds. He has better edema control on the left where he has been using 4 layer compression versus wound of wounds on the right and I pointed this out to him. There is no inflammation in the skin in his lower legs which is also somewhat unusual for him. There is no open wounds on the dorsal left foot. He has extremitease stockings at home and I have asked him to bring these in next week. 11/25/18 patient's lower extremity on examination today on the left appears for the most part to be wound free. He does have an open wound on the lateral aspect of the right lower extremity but this is minimal compared to what I've seen in past. He does request that we go ahead and wrap the left leg as well even though there's nothing open just so hopefully it will not reopen in short order. 1/28; patient has superficial open wounds on the right lateral calf left anterior calf and left posterior calf. His edema control is adequate. 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 We have been using silver alginate as the primary dressing. He claims compliance with his compression pumps 2/4; patient has superficial open wounds on numerous areas of his left calf and again one on the left dorsal foot. The areas on the right lateral calf have healed. The cellulitis that I gave him doxycycline for last week is also resolved this was mostly on the left anterior calf just below the tibial tuberosity. His edema looks fairly well-controlled. He tells me he went to see his primary  doctor today and had blood work ordered 2/11; once again he has several open areas on the left calf left tibial area. Most of these are small and appear to have healthy granulation. He does not have anything open on the right. The edema and control in his thighs is pretty good which is usually a good indication he has been using his pumps as requested. 2/18; he continues to have several small areas on the left calf and left tibial area. Most of these are small healthy granulation. We put him in his stocking on the right  leg last week and he arrives with a superficial open area over the right upper tibia and a fairly large area on the right lateral tibia in similar condition. His edema control actually does not look too bad, he claims to be using his compression pumps twice a day 2/25. Continued small areas on the left calf and left tibial area. New areas especially on the right are identified just below the tibial tuberosity and on the right upper tibia itself. There are also areas of weeping edema fluid even without an obvious wound. He does not have a considerable degree of lymphedema but clearly there is more edema here than his skin can handle. He states he is using the pumps twice a day. We have an Unna boot on the right and 4 layer compression on the left. 3/3; he continues to have an area on the right lateral calf and right posterior calf just below the popliteal fossa. There is a fair amount of tenderness around the wound on the popliteal fossa but I did not see any evidence of cellulitis, could just be that the wrap came down and rubbed in this area. He does not have an open area on the left leg however there is an area on the left dorsal foot at the base of the third toe We have been using silver alginate to all wound areas 3/10; he did not have an open area on his left leg last time he was here a week ago. Today he arrives with a horizontal wound just below the tibial tuberosity and an  area on the left lateral calf. He has intense erythema and tenderness in this area. The area is on the right lateral calf and right posterior calf better than last week. We have been using silver alginate as usual 3/18 - Patient returns with 3 small open areas on left calf, and 1 small open area on right calf, the skin looks ok with no significant erythema, he continues the UNA boot on right and 4 layer compression on left. The right lateral calf wound is closed , the right posterior is small area. we will continue silver alginate to the areas. 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 He is been tolerating the dressing changes without complication. Fortunately she has made excellent improvement in regard to the overall status of his wounds. Unfortunately every time we cease wrapping him he ends up reopening in causing more significant issues at that point. Again I'm unsure of the best direction to take although I think the lymphedema clinic may be appropriate for him. 02/03/19 on evaluation today patient appears to be doing well in regard to the wounds that we saw him for last week unfortunately he has a new area on the proximal portion of his right medial/posterior lower extremity where the wrap somewhat slowed down and caused swelling and a blister to rub and open. Unfortunately this is the only opening that he has on either leg at this point. 02/17/19 on evaluation today patient's bilateral lower extremities appear to be doing well. He still completely healed in regard to the left lower extremity. In regard to the right lower extremity the area where the wrap and slid down and caused the blister still seems to be slightly open although this is dramatically better than during the  last evaluation two  weeks ago. I'm very pleased with the way this stands overall. 03/03/19 on evaluation today patient appears to be doing well in regard to his right lower extremity in general although he did have a new blister open this does not appear to be showing any evidence of active infection at this time. Fortunately there's No fevers, chills, nausea, or vomiting noted at this time. Overall I feel like he is making good progress it does feel like that the right leg will we perform the [employer] seems to do with a bit better than three layer wrap on the left which slid down on him. We may switch to doing bilateral in the book wraps. 5/4; I have not seen Mr. Metter in quite some time. According to our case manager he did not have an open wound on his left leg last week. He had 1 remaining wound on the right posterior medial calf. He arrives today with multiple openings on the left leg probably were blisters and/or wrap injuries from Unna boots. I do not think the Unna boot's will provide adequate compression on the left. I am also not clear about the frequency he is using the compression pumps. 03/17/19 on evaluation today patient appears to be doing excellent in regard to his lower extremities compared to last week's evaluation apparently. He had gotten significantly worse last week which is unfortunate. The [employer] wrap on the left did not seem to do very well for him at all and in fact it didn't control his swelling significantly enough he had an additional outbreak. Subsequently we go back to the four layer compression wrap on the left. This is good news. At least in that he is doing better and the wound seem to be killing him. He still has not heard anything from the lymphedema clinic. 03/24/19 on evaluation today patient actually appears to be doing much better in regard to his bilateral lower Trinity as compared to last week when I saw him. Fortunately there's no signs of active infection at this time. He  has been tolerating the dressing changes without complication. Overall I'm extremely pleased with the progress and appearance in general. 04/07/19 on evaluation today patient appears to be doing well in regard to his bilateral lower extremities. His swelling is significantly down from where it was previous. With that being said he does have a couple blisters still open at this point but fortunately nothing that seems to be too severe and again the majority of the larger openings has healed at this time. 04/14/19 on evaluation today patient actually appears to be doing quite well in regard to his bilateral lower extremities in fact I'm not even sure there's anything significantly open at this time at any site. Nonetheless he did have some trouble with these wraps where they are somewhat irritating him secondary to the fact that he has noted that the graph wasn't too close down to the end of this foot in a little bit short as well up to his knee. Otherwise things seem to be doing quite well. 04/21/19 upon evaluation today patient's wound bed actually showed evidence of being completely healed in regard to both lower extremities which is excellent news. There does not appear to be any signs of active infection which is also good news. I'm very pleased in this regard. No fevers, chills, nausea, or vomiting noted at this time. 04/28/19 on evaluation today patient appears to be doing a little bit worse in regard  to both lower extremities on the left mainly due to the fact that when he went infection disease the wrap was not wrapped quite high enough he developed a blister above this. On the right he is a small open area of nothing too significant but again this is continuing to give him some trouble he has been were in the Velcro compression that he has at home. 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 He's been tolerating the dressing changes  without complication. Fortunately there's no signs of active infection at this time. No fevers, chills, nausea, or vomiting noted at this time. We have been trying to get an appointment with her lymphedema clinic in [facility] but unfortunately nobody can get them on phone with not been able to even fax information over the patient likewise is not been able to get in touch with them. Overall I'm not sure exactly what's going on here with to reach out again today. 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 Still having a lot of drainage unfortunately. He tells me especially in the left but even on the right. There's no signs of active infection which is good news we've been using so ratcheted up to this point. 05/19/19 on evaluation today patient actually appears to be doing quite well with regard to his left lower extremity which is great news. Fortunately in regard to the right lower extremity has an issues with his wrap and he subsequently did remove this from what I'm understanding. Nonetheless long story short is what he had rewrapped once he removed it subsequently had maggots underneath this wrap whenever he came in for evaluation today. With that being said they were obviously completely cleaned away by the nursing staff. The visit today which is excellent news. However he does appear to potentially have some infection around the right ankle region where the maggots were located as well. He will likely require anabiotic therapy today. 05/26/19 on evaluation today patient actually appears to be doing much better in regard to his bilateral lower extremities. I feel like the infection is under much better control. With that being said there were maggots noted when the wrap was removed yet again today. Again this could have potentially been left over from previous although at this time there does  not appear to be any signs of significant drainage there was obviously on the wrap some drainage as well this contracted gnats or otherwise. Either way I do not see anything that appears to be doing worse in my pinion and in fact I think his drainage has slowed down quite significantly likely mainly due to the fact to his infection being under better control. 06/02/2019 on evaluation today patient actually appears to be doing well with regard to his bilateral lower extremities there is no signs of active infection at this time which is great news. With that being said he does have several open areas more so on the right than the left but nonetheless these are all significantly better than previously noted. 06/09/2019 on evaluation today patient actually appears to be doing well. His wrap stayed up and he did not cause any problems he had more drainage on the right compared to the left but overall I do not see any major issues at this time which is great news. 06/16/2019 on evaluation today patient appears to be  doing excellent with regard to his lower extremities the only area that is open is a new blister that can have opened as of today on the medial ankle on the left. Other than this he really seems to be doing great I see no major issues at this point. 06/23/2019 on evaluation today patient appears to be doing quite well with regard to his bilateral lower extremities. In fact he actually appears to be almost completely healed there is a small area of weeping noted of the right lower extremity just above the ankle. Nonetheless fortunately there is no signs of active infection at this time which is good news. No fevers, chills, nausea, vomiting, or diarrhea. 8/24; the patient arrived for a nurse visit today but complained of very significant pain in the left leg and therefore I was asked to look at this. Noted that he did not have an open area on the left leg last week nevertheless this was wrapped.  The patient states that he is not been able to put his compression pumps on the left leg because of the discomfort. He has not been systemically unwell 06/30/2019 on evaluation today patient unfortunately despite being excellent last week is doing much worse with regard to his left lower extremity today. In fact he had to come in for a nurse on Monday where his left leg had to be rewrapped due to excessive weeping Dr. Leanord Hawking placed him on doxycycline at that point. Fortunately there is no signs of active infection Systemically at this time which is good news. 07/07/2019 in regard to the patient's wounds today he actually seems to be doing well with his right lower extremity there really is nothing open or draining at this point this is great news. Unfortunately the left lower extremity is given him additional trouble at this time. There does not appear to be any signs of active infection nonetheless he does have a lot of edema and swelling noted at this point as well as blistering all of which has led to a much more poor appearing leg at this time compared to where it was 2 weeks ago when it was almost completely healed. Obviously this is a little discouraging for the patient. He is try to contact the lymphedema clinic in [place] he has not been able to get through to them. 07/14/2019 on evaluation today patient actually appears to be doing slightly better with regard to his left lower extremity ulcers. Overall I do feel like at least at the top of the wrap that we have been placing this area has healed quite nicely and looks much better. The remainder of the leg is showing signs of improvement. Unfortunately in the thigh area he still has an open region on the left and again on the right he has been utilizing just a Band-Aid on an area that also opened on the thigh. Again this is an area that were not able to wrap although we did do an Ace wrap to provide some compression that something that  obviously is a little less effective than the compression wraps we have been using on the lower portion of the leg. He does have an appointment with the lymphedema clinic in [facility] on Friday. 07/21/2019 on evaluation today patient appears to be doing better with regard to his lower extremity ulcers. He has been tolerating the dressing changes without complication. Fortunately there is no signs of active infection at this time. No fevers, chills, nausea, vomiting, or diarrhea. I did receive  the paperwork from the physical therapist at the lymphedema clinic in [place]. Subsequently I signed off on that this morning and sent that back to him for further progression with the treatment plan. 07/28/2019 on evaluation today patient appears to be doing very well with regard to his right lower extremity where I do not see any open wounds at this point. Fortunately he is feeling great as far as that is concerned as well. In regard to the left lower extremity he has been having issues with still several areas of weeping and edema although the upper leg is doing better his lower leg still I think is going require the compression wrap at this time. No fevers, chills, nausea, vomiting, or diarrhea. 08/04/2019 on evaluation today patient unfortunately is having new wounds on the right lower extremity. Again we have been using Unna boot wrap on that side. We switched him to using his juxta lite wrap at home. With that being said he tells me he has been using it although his legs extremely swollen and to be honest really does not appear that he has been. I cannot know that for sure however. Nonetheless he has multiple new wounds on the right lower extremity at this time. Obviously we will have to see about getting this rewrapped for him today. 08/11/2019 on evaluation today patient appears to be doing fairly well with regard to his wounds. He has been tolerating the dressing changes including the  compression wraps without complication. He still has a lot of edema in his upper thigh regions bilaterally he is supposed to be seeing the lymphedema clinic on the 15th of this month once his wraps arrive for the upper part of his legs. 08/18/2019 on evaluation today patient appears to be doing well with regard to his bilateral lower extremities at this point. He has been tolerating the dressing changes without complication. Fortunately there is no signs of active infection which is also good news. He does have a couple weeping areas on the first and second toe of the right foot he also has just a small area on the left foot upper leg and a small area on the left lower leg but overall he is doing quite well in my opinion. He is supposed to be getting his wraps shortly in fact tomorrow and then subsequently is seeing the lymphedema clinic next Wednesday on the 21st. Of note he is also leaving on the 25th to go on vacation for a week to the beach. For that reason and since there is some uncertainty about what there can be doing at lymphedema clinic next Wednesday I am get a make an appointment for next Friday here for [place] to see what we need to do for him prior to him leaving for vacation. 10/23; patient arrives in considerable pain predominantly in the upper posterior calf just distal to the popliteal fossa also in the wound anteriorly above the major wound. This is probably cellulitis and he has had this recurrently in the past. He has no open wound on the right side and he has had an Radio broadcast assistant in that area. Finally I note that he has an area on the left posterior calf which by enlarge is mostly epithelialized. This protrudes beyond the borders of the surrounding skin in the setting of dry scaly skin and lymphedema. The patient is leaving for [facility] on Sunday. Per his longstanding pattern, he will not take his compression pumps with him predominantly out of fear that they  will be stolen. He  therefore asked that we put a Unna boot back on the right leg. He will also contact the wound care center in [facility] to see if they can change his dressing in the mid week. 11/3; patient returned from his vacation to [facility]. He was seen on 1 occasion at their wound care center. They did a 2 layer compression system as they did not have our 4-layer wrap. I am not completely certain what they put on the wounds. They did not change the Unna boot on the right. The patient is also seeing a lymphedema specialist physical therapist in [place]. It appears that he has some compression sleeve for his thighs which indeed look quite a bit better than I am used to seeing. He pumps over these with his external compression pumps. 11/10; the patient has a new wound on the right medial thigh otherwise there is no open areas on the right. He has an area on the left leg posteriorly anteriorly and medially and an area over the left second toe. We have been using silver alginate. He thinks the injury on his thigh is secondary to friction from the compression sleeve he has. 11/17; the patient has a new wound on the right medial thigh last week. He thinks this is because he did not have a underlying stocking for his thigh juxta lite apparatus. He now has this. The area is fairly large and somewhat angry but I do not think he has underlying cellulitis. He has a intact blister on the right anterior tibial area. Small wound on the right great toe dorsally Small area on the medial left calf. 11/30; the patient does not have any open areas on his right leg and we did not take his juxta lite stocking off. However he states that on Friday his compression wrap fell down lodging around his upper mid calf area. As usual this creates a lot of problems for him. He called urgently today to be seen for a nurse visit however the nurse visit turned into a provider visit because of extreme erythema and pain in the  left anterior tibia extending laterally and posteriorly. The area that is problematic is extensive 10/06/2019 upon evaluation today patient actually appears to be doing poorly in regard to his left lower extremity. He Dr. Leanord Hawking did place him on doxycycline this past Monday apparently due to the fact that he was doing much worse in regard to this left leg. Fortunately the doxycycline does seem to be helping. Unfortunately we are still having a very difficult time getting his edema under any type of control in order to anticipate discharge at some point. The only way were really able to control his lymphedema really is with compression wraps and that has only even seemingly temporary. He has been seeing a lymphedema clinic they are trying to help in this regard but still this has been somewhat frustrating in general for the patient. 10/13/19 on evaluation today patient appears to be doing excellent with regard to his right lower extremity as far as the wounds are concerned. His swelling is still quite extensive unfortunately. He is still having a lot of drainage from the thigh areas bilaterally which is unfortunate. He's been going to lymphedema clinic but again he still really does not have this edema under control as far as his lower extremities are concern. With regard to his left lower extremity this seems to be improving and I do believe the doxycycline has been  of benefit for him. He is about to complete the doxycycline. 10/20/2019 on evaluation today patient appears to be doing poorly in regard to his bilateral lower extremities. More in the right thigh he has a lot of irritation at this site unfortunately. In regard to the left lower extremity the wrap was not quite as high it appears and does seem to have caused him some trouble as well. Fortunately there is no evidence of systemic infection though he does have some blue-green drainage which has me concerned for the possibility of  Pseudomonas. He tells me he is previously taking Cipro without complications and he really does not care for Levaquin however due to some of the side effects he has. He is not allergic to any medications specifically antibiotics that were aware of. 10/27/2019 on evaluation today patient actually does appear to be for the most part doing better when compared to last week's evaluation. With that being said he still has multiple open wounds over the bilateral lower extremities. He actually forgot to start taking the Cipro and states that he still has the whole bottle. He does have several new blisters on left lower extremity today I think I would recommend he go ahead and take the Cipro based on what I am seeing at this point. 12/30-Patient comes at 1 week visit, 4 layer compression wraps on the left and Unna boot on the right, primary dressing Xtrasorb and silver alginate. Patient is taking his Cipro and has a few more days left probably 5-6, and the legs are doing better. He states he is using his compressions devices which I believe he has 11/10/2019 on evaluation today patient actually appears to be much better than last time I saw him 2 weeks ago. His wounds are significantly improved and overall I am very pleased in this regard. Fortunately there is no signs of active infection at this time. He is just a couple of days away from completing Cipro. Overall his edema is much better he has been using his lymphedema pumps which I think is also helping at this point. 11/17/2019 on evaluation today patient appears to be doing excellent in regard to his wounds in general. His legs are swollen but not nearly as much as they have been in the past. Fortunately he is tolerating the compression wraps without complication. No fevers, chills, nausea, vomiting, or diarrhea. He does have some erythema however in the distal portion of his right lower extremity specifically around the forefoot and toes there is a  little bit of warmth here as well. 11/24/2019 on evaluation today patient appears to be doing well with regard to his right lower extremity I really do not see any open wounds at this point. His left lower extremity does have several open areas and his right medial thigh also is open. Other than this however overall the patient seems to be making good progress and I am very pleased at this point. 12/01/2019 on evaluation today patient appears to be doing poorly at this point in regard to his left lower extremity has several new blisters despite the fact that we have him in compression wraps. In fact he had a 4-layer compression wrap, his upper thigh wrapped from lymphedema clinic, and a juxta light over top of the 4 layer compression wrap the lymphedema clinic applied and despite all this he still develop blisters underneath. Obviously this does have me concerned about the fact that unfortunately despite what we are doing to try to get wounds  healed he continues to have new areas arise I do not think he is ever good to be at the point where he can realistically just use wraps at home to keep things under control. Typically when we heal him it takes about 1-2 days before he is back in the clinic with severe breakdown and blistering of his lower extremities bilaterally. This is happened numerous times in the past. Unfortunately I think that we may need some help as far as overall fluid overload to kind of limit what we are seeing and get things under better control. 12/08/2019 on evaluation today patient presents for follow-up concerning his ongoing bilateral lower extremity edema. Unfortunately he is still having quite a bit of swelling the compression wraps are controlling this to some degree but he did see Dr. [name] his cardiologist I do have that available for review today as far as the appointment was concerned that was on 12/06/2019. Obviously that she has been 2 days ago. The patient states that he  is only been taking the Lasix 80 mg 1 time a day he had told me previously he was taking this twice a day. Nonetheless Dr. [name] recommended this be up to 80 mg 2 times a day for the patient as he did appear to be fluid overloaded. With that being said the patient states he did this yesterday and he was unable to go anywhere or do anything due to the fact that he was constantly having to urinate. Nonetheless I think that this is still good to be something that is important for him as far as trying to get his edema under control at all things that he is going to be able to just expect his wounds to get under control and things to be better without going through at least a period of time where he is trying to stabilize his fluid management in general and I think increasing the Lasix is likely the first step here. It was also mentioned the possibility that the patient may require metolazone. With that being said he wanted to have the patient take Lasix twice a day first and then reevaluating 2 months to see where things stand. 12/15/2019 upon evaluation today patient appears to be doing regard to his legs although his toes are showing some signs of weeping especially on the left at this point to some degree on the right. There does not appear to be any signs of active infection and overall I do feel like the compression wraps are doing well for him but he has not been able to take the Lasix at home and the increased dose that Dr. [name] recommended. He tells me that just not go to be feasible for him. Nonetheless I think in this case he should probably send a message to Dr. [name] in order to discuss options from the standpoint of possible admission to get the fluid off or otherwise going forward. 12/22/2019 upon evaluation today patient appears to be doing fairly well with regard to his lower extremities at this point. In fact he would be doing excellent if it was not for the fact that his right anterior  thigh apparently had an allergic reaction to adhesive tape that he used. The wound itself that we have been monitoring actually appears to be healed. There is a lot of irritation at this point. 12/29/2019 upon evaluation today patient appears to be doing well in regard to his lower extremities. His left medial thigh is open and somewhat draining  today but this is the only region that is open the right has done much better with the treatment utilizing the steroid cream that I prescribed for him last week. Overall I am pleased in that regard. Fortunately there is no signs of active infection at this time. No fevers, chills, nausea, vomiting, or diarrhea. 01/05/2020 upon evaluation today patient appears to be doing more poorly in regard to his right lower extremity at this point upon evaluation today. Unfortunately he continues to have issues in this regard and I think the biggest issue is controlling his edema. This obviously is not very well controlled at this point is been recommended that he use the Lasix twice a day but he has not been able to do that. Unfortunately I think this is leading to an issue where honestly he is not really able to effectively control his edema and therefore the wounds really are not doing significantly better. I do not think that he is going to be able to keep things under good control unless he is able to control his edema much better. I discussed this again in great detail with him today. 01/12/2020 good news is patient actually appears to be doing quite well today at this point. He does have an appointment with lymphedema clinic tomorrow. His legs appear healed and the toe on the left is almost completely healed. In general I am very pleased with how things stand at this point. Electronic Signature(s) Signed: 01/12/2020 11:21:29 AM By: Lenda Kelp PA-C Entered By: Lenda Kelp on 01/12/2020  11:21:28 -------------------------------------------------------------------------------- Physical Exam Details Patient Name: Date of Service: [name], [name] 01/12/2020 10:30 AM Medical Record [id number] Patient Account Number: [id number] Date of Birth/Sex: Treating RN: September 27, 1951 (69 y.o. [name] Primary Care Provider: Nicoletta Ba Other Clinician: Referring Provider: Treating Provider/Extender:[name], [name], [name] in Treatment: 207 Constitutional Well-nourished and well-hydrated in no acute distress. Respiratory normal breathing without difficulty. Psychiatric this patient is able to make decisions and demonstrates good insight into disease process. Alert and Oriented x 3. pleasant and cooperative. Notes Patient's wound bed currently showed signs of good granulation on the 4th toe region of the left foot the rest of the wounds over his lower extremities appear to be completely healed and are doing quite well. I am very pleased with how things stand at this time. Electronic Signature(s) Signed: 01/12/2020 11:21:44 AM By: Lenda Kelp PA-C Entered By: Lenda Kelp on 01/12/2020 11:21:44 -------------------------------------------------------------------------------- Physician Orders Details Patient Name: Date of Service: [name], [name] 01/12/2020 10:30 AM Medical Record [id number] Patient Account Number: [id number] Date of Birth/Sex: Treating RN: 1950/11/12 (69 y.o. 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 Dressing Change Frequency Wound #160 Left Toe Fourth Do not change entire dressing for one week. - both legs Skin Barriers/Peri-Wound Care Moisturizing lotion - both legs TCA Cream or Ointment - mixed with lotion Wound Cleansing May shower  with protection. Primary Wound Dressing Wound #160 Left Toe Fourth Calcium Alginate with Silver Secondary Dressing Wound #160 Left Toe Fourth Kerlix/Rolled Gauze Dry Gauze Drawtex Edema Control 4 layer compression: Left lower extremity Unna Boot to Right Lower Extremity Avoid standing for long periods of time Elevate legs to the level of the heart or above for 30 minutes daily and/or when sitting, a frequency of: - throughout the day Exercise regularly Segmental Compressive Device. - [facility] pumps 60 min 2 times per day Other: - [facility] wraps to upper leg per [facility] Electronic Signature(s) Signed: 01/12/2020 5:29:37 PM By: Lenda Kelp PA-C Signed: 01/12/2020 5:43:44 PM By: Zenaida Deed RN, BSN Entered By: Zenaida Deed on 01/12/2020 11:22:20 -------------------------------------------------------------------------------- Problem List Details Patient Name: Date of Service: [name], [name] 01/12/2020 10:30 AM Medical Record [id number] Patient Account Number: [id number] Date of Birth/Sex: Treating RN: 12-16-1950 (69 y.o. 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 01/22/2016 No Yes and inflammation of bilateral lower extremity I89.0 Lymphedema, not elsewhere classified 01/22/2016 No Yes E11.622 Type 2 diabetes mellitus with other skin ulcer 01/22/2016 No Yes 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, 01/22/2016 No Yes unspecified L03.116 Cellulitis of left lower limb 04/01/2017 No Yes Inactive Problems ICD-10 Code Description Active Date Inactive Date 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 06/30/2017 skin L97.521 Non-pressure chronic ulcer of other part of left foot limited to 04/27/2018 04/27/2018 breakdown of skin L03.115 Cellulitis of right lower limb 12/22/2017 12/22/2017 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 with other specified 06/30/2018 06/30/2018 severity L97.511 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 06/30/2018 06/30/2018 breakdown of skin Resolved Problems Electronic Signature(s) Signed: 01/12/2020 10:48:26 AM By: Lenda Kelp PA-C Entered By: Lenda Kelp on 01/12/2020 10:48:25 -------------------------------------------------------------------------------- Progress Note Details Patient Name: Date of Service: [name], [name] 01/12/2020 10:30 AM Medical Record [id number] Patient Account Number: [id number] Date of Birth/Sex: Treating RN: 1951/10/31 (69 y.o. [name] Primary Care Provider: Nicoletta Ba Other Clinician: Referring Provider: Treating Provider/Extender:[name], [name], [name] in Treatment: 207 Subjective Chief Complaint Information obtained from Patient patient is here for evaluation venous/lymphedema weeping History of Present Illness (HPI) Referred by PCP for  consultation. Patient has long standing history of BLE venous stasis, no prior ulcerations. At beginning of month, developed cellulitis and weeping. Received IM Rocephin followed by Keflex and resolved. Wears compression stocking, appr 6 months old. Not sure strength. No present drainage. 01/22/16 this is a patient who is a type II diabetic on insulin. 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 He tells me he religiously wears pressure stockings of uncertain strength. He was here with weeping edema about 8 months ago but did not have an open wound. Roughly a month ago he had a reopening on his bilateral legs. He is been using bandages and Neosporin. He does not complain of pain. 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 Last BUN/creatinine I have is from 11/20/15  at 13 and 1.0 respectively 01/29/16; patient arrives today having tolerated the Profore wrap. He brought in his stockings and these are 18 mmHg stockings he bought from [place]. The compression here is likely inadequate. He does not complain of pain or excessive drainage she has no systemic symptoms. The wound on the right looks improved as does the one on the left although one on the left is more substantial with still tissue at risk below the actual wound area on the bilateral posterior calf 02/05/16; patient arrives with poor edema control. He states that we did put a 4 layer compression on it last week. No weight appear 5 this. 02/12/16; the area on the posterior right Has healed. The left Has a substantial wound that has necrotic surface eschar that requires a debridement with a curette. 02/16/16;the patient called or a Nurse visit secondary to increased swelling. He had been in earlier in the week with his right leg healed. He was transitioned to is on pressure stocking on the right leg with the  only open wound on the left, a substantial area on the left posterior calf. Note he has a history of severe lower extremity edema, he has a history of chronic atrial fibrillation but not heart failure per my notes but I'll need to research this. He is not complaining of chest pain shortness of breath or orthopnea. The intake nurse noted blisters on the previously closed right leg 02/19/16; this is the patient's regular visit day. I see him on Friday with escalating edema new wounds on the right leg and clear signs of at least right ventricular heart failure. I increased his Lasix to 40 twice a day. He is returning currently in follow-up. States he is noticed a decrease in that the edema 02/26/16 patient's legs have much less edema. There is nothing really open on the right leg. The left leg has improved condition of the large superficial wound on the posterior left leg 03/04/16; edema control is very much better. The patient's right leg wounds have healed. On the left leg he continues to have severe venous inflammation on the posterior aspect of the left leg. There is no tenderness and I don't think any of this is cellulitis. 03/11/16; patient's right leg is married healed and he is in his own stocking. The patient's left leg has deteriorated somewhat. There is a lot of erythema around the wound on the posterior left leg. There is also a significant rim of erythema posteriorly just above where the wrap would've ended there is a new wound in this location and a lot of tenderness. Can't rule out cellulitis in this area. 03/15/16; patient's right leg remains healed and he is in his own stocking. The patient's left leg is much better than last review. His major wound on the posterior aspect of his left Is almost fully epithelialized. He has 3 small injuries from the wraps. Really. Erythema seems a lot better on antibiotics 03/18/16; right leg remains healed and he is in his own stocking. The patient's left  leg is much better. The area on the posterior aspect of the left calf is fully epithelialized. His 3 small injuries which were wrap injuries on the left are improved only one seems still open his erythema has resolved 03/25/16; patient's right leg remains healed and he is in his own stocking. There is no open area today on the left leg posterior leg is completely closed up. His wrap injuries at the superior aspect of his leg are  also resolved. He looks as though he has some irritation on the dorsal ankle but this is fully epithelialized without evidence of infection. 03/28/16; we discharged this patient on Monday. Transitioned him into his own stocking. There were problems almost immediately with uncontrolled swelling weeping edema multiple some of which have opened. He does not feel systemically unwell in particular no chest pain no shortness of breath and he does not feel 04/08/16; the edema is under better control with the Profore light wrap but he still has pitting edema. There is one large wound anteriorly 2 on the medial aspect of his left leg and 3 small areas on the superior posterior calf. Drainage is not excessive he is tolerating a Profore light well 04/15/16; put a Profore wrap on him last week. This is controlled is edema however he had a lot of pain on his left anterior foot most of his wounds are healed 04/22/16 once again the patient has denuded areas on the left anterior foot which he states are because his wrap slips up word. He saw his primary physician today is on Lasix 40 twice a day and states that he his weight is down 20 pounds over the last 3 months. 04/29/16: Much improved. left anterior foot much improved. He is now on Lasix 80 mg per day. Much improved edema control 05/06/16; I was hoping to be able to discharge him today however once again he has blisters at a low level of where the compression was placed last week mostly on his left lateral but also his left medial leg and a  small area on the anterior part of the left foot. 05/09/16; apparently the patient went home after his appointment on 7/4 later in the evening developing pain in his upper medial thigh together with subjective fever and chills although his temperature was not taken. The pain was so intense he felt he would probably have to call 911. However he then remembered that he had leftover doxycycline from a previous round of antibiotics and took these. By the next morning he felt a lot better. He called and spoke to one of our nurses and I approved doxycycline over the phone thinking that this was in relation to the wounds we had previously seen although they were definitely were not. The patient feels a lot better old fever no chills he is still working. Blood sugars are reasonably controlled 05/13/16; patient is back in for review of his cellulitis on his anterior medial upper thigh. He is taking doxycycline this is a lot better. Culture I did of the nodular area on the dorsal aspect of his foot grew MRSA this also looks a lot better. 05/20/16; the patient is cellulitis on the medial upper thigh has resolved. All of his wound areas including the left anterior foot, areas on the medial aspect of the left calf and the lateral aspect of the calf at all resolved. He has a new blister on the left dorsal foot at the level of the fourth toe this was excised. No evidence of infection 05/27/16; patient continues to complain weeping edema. He has new blisterlike wounds on the left anterior lateral and posterior lateral calf at the top of his wrap levels. The area on his left anterior foot appears better. He is not complaining of fever, pain or pruritus in his feet. 05/30/16; the patient's blisters on his left anterior leg posterior calf all look improved. He did not increase the Lasix 100 mg as I suggested because he was  going to run out of his 40 mg tablets. He is still having weeping edema of his toes 06/03/16; I  renewed his Lasix at 80 mg once a day as he was about to run out when I last saw him. He is on 80 mg of Lasix now. I have asked him to cut down on the excessive amount of water he was drinking and asked him to drink according to his thirst mechanisms 06/12/2016 -- was seen 2 days ago and was supposed to wear his compression stockings at home but he is developed lymphedema and superficial blisters on the left lower extremity and hence came in for a review 06/24/16; the remaining wound is on his left anterior leg. He still has edema coming from between his toes. There is lymphedema here however his edema is generally better than when I last saw this. He has a history of atrial fibrillation but does not have a known history of congestive heart failure nevertheless I think he probably has this at least on a diastolic basis. 07/01/16 I reviewed his echocardiogram from January 2017. This was essentially normal. He did not have LVH, EF of 55-60%. His right ventricular function was normal although he did have trivial tricuspid and pulmonic regurgitation. This is not audible on exam however. I increased his Lasix to do massive edema in his legs well above his knees I think in early July. He was also drinking an excessive amount of water at the time. 07/15/16; missed his appointment last week because of the Labor Day holiday on Monday. He could not get another appointment later in the week. Started to feel the wrap digging in superiorly so we remove the top half and the bottom half of his wrap. He has extensive erythema and blistering superiorly in the left leg. Very tender. Very swollen. Edema in his foot with leaking edema fluid. He has not been systemically unwell 07/22/16; the area on the left leg laterally required some debridement. The medial wounds look more stable. His wrap injury wounds appear to have healed. Edema and his foot is better, weeping edema is also better. He tells me he is meeting with the  supplier of the external compression pumps at work 08/05/16; the patient was on vacation last week in [facility]. His wrap is been on for an extended period of time. Also over the weekend he developed an extensive area of tender erythema across his anterior medial thigh. He took to doxycycline yesterday that he had leftover from a previous prescription. The patient complains of weeping edema coming out of his toes 08/08/16; I saw this patient on 10/2. He was tender across his anterior thigh. I put him on doxycycline. He returns today in follow-up. He does not have any open wounds on his lower leg, he still has edema weeping into his toes. 08/12/16; patient was seen back urgently today to follow-up for his extensive left thigh cellulitis/erysipelas. He comes back with a lot less swelling and erythema pain is much better. I believe I gave him Augmentin and Cipro. His wrap was cut down as he stated a roll down his legs. He developed blistering above the level of the wrap that remained. He has 2 open blisters and 1 intact. 08/19/16; patient is been doing his primary doctor who is increased his Lasix from 40-80 once a day or 80 already has less edema. Cellulitis has remained improved in the left thigh. 2 open areas on the posterior left calf 08/26/16; he returns today having  new open blisters on the anterior part of his left leg. He has his compression pumps but is not yet been shown how to use some vital representative from the supplier. 09/02/16 patient returns today with no open wounds on the left leg. Some maceration in his plantar toes 09/10/2016 -- Dr. Leanord Hawking had recently discharged him on 09/02/2016 and he has come right back with redness swelling and some open ulcers on his left lower extremity. He says this was caused by trying to apply his compression stockings and he's been unable to use this and has not been able to use his lymphedema pumps. He had some doxycycline leftover and he has  started on this a few days ago. 09/16/16; there are no open wounds on his leg on the left and no evidence of cellulitis. 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 He does not complain of symptoms here. I am not clear use using his external compression pumps. 09/23/16; I have not seen this patient in 2 weeks. He canceled his appointment 10 days ago as he was going on vacation. He tells me that on Monday he noticed a large area on his posterior left leg which is been draining copiously and is reopened into a large wound. He is been using ABDs and the external part of his juxtalite, according to our nurse this was not on properly. 10/07/16; Still a substantial area on the posterior left leg. Using silver alginate 10/14/16; in general better although there is still open area which looks healthy. Still using silver alginate. He reminds me that this happen before he left for [facility]. Today while he was showering in the morning. He had been using his juxtalite's 10/21/16; the area on his posterior left leg is fully epithelialized. However he arrives today with a large area of tender erythema in his medial and posterior left thigh just above the knee. I have marked the area. Once again he is reluctant to consider hospitalization. I treated him with oral antibiotics in the past for a similar situation with resolution I think with doxycycline however this area it seems more extensive to me. He is not complaining of fever but does have chills and says states he is thirsty. His blood sugar today was in the 140s at home 10/25/16 the area on his posterior left leg is fully epithelialized although there is still some weeping edema. The large area of tenderness and erythema in his medial and posterior left thigh is a lot less tender although there is still a lot of swelling in this thigh. He states he feels a lot better. He is on doxycycline and  Augmentin that I started last week. This will continued until Tuesday, December 26. I have ordered a duplex ultrasound of the left thigh rule out DVT whether there is an abscess something that would need to be drained I would also like to know. 11/01/16; he still has weeping edema from a not fully epithelialized area on his left posterior calf. Most of the rest of this looks a lot better. He has completed his antibiotics. His thigh is a lot better. Duplex ultrasound did not show a DVT in the thigh 11/08/16; he comes in today with more Denuded surface epithelium from the posterior aspect of his calf. There is no real evidence of cellulitis. The superior aspect of his wrap appears to have put quite an indentation in his leg just below the knee and this may  have contributed. He does not complain of pain or fever. We have been using silver alginate as the primary dressing. The area of cellulitis in the right thigh has totally resolved. He has been using his compression stockings once a week 11/15/16; the patient arrives today with more loss of epithelium from the posterior aspect of his left calf. He now has a fairly substantial wound in this area. The reason behind this deterioration isn't exactly clear although his edema is not well controlled. He states he feels he is generally more swollen systemically. He is not complaining of chest pain shortness of breath fever. Tells me he has an appointment with his primary physician in early February. He is on 80 mg of oral Lasix a day. He claims compliance with the external compression pumps. He is not having any pain in his legs similar to what he has with his recurrent cellulitis 11/22/16; the patient arrives a follow-up of his large area on his left lateral calf. This looks somewhat better today. He came in earlier in the week for a dressing change since I saw him a week ago. He is not complaining of any pain no shortness of breath no chest pain 11/28/16;  the patient arrives for follow-up of his large area on the left lateral calf this does not look better. In fact it is larger weeping edema. The surface of the wound does not look too bad. We have been using silver alginate although I'm not certain that this is a dressing issue. 12/05/16; again the patient follows up for a large wound on the left lateral and left posterior calf this does not look better. There continues to be weeping edema necrotic surface tissue. More worrisome than this once again there is erythema below the wound involving the distal Achilles and heel suggestive of cellulitis. He is on his feet working most of the day of this is not going well. We are changing his dressing twice a week to facilitate the drainage. 12/12/16; not much change in the overall dimensions of the large area on the left posterior calf. This is very inflamed looking. I gave him an. Doxycycline last week does not really seem to have helped. He found the wrap very painful indeed it seems to of dog into his legs superiorly and perhaps around the heel. He came in early today because the drainage had soaked through his dressings. 12/19/16- patient arrives for follow-up evaluation of his left lower extremity ulcers. He states that he is using his lymphedema pumps once daily when there is "no drainage". He admits to not using his lipedema pumps while under current treatment. His blood sugars have been consistently between 150-200. 12/26/16; the patient is not using his compression pumps at home because of the wetness on his feet. I've advised him that I think it's important for him to use this daily. He finds his feet too wet, he can put a plastic bag over his legs while he is in the pumps. Otherwise I think will be in a vicious circle. We are using silver alginate to the major area on his left posterior calf 01/02/17; the patient's posterior left leg has further of all into 3 open wounds. All of them covered with a  necrotic surface. He claims to be using his compression pumps once a day. His edema control is marginal. Continue with silver alginate 01/10/17; the patient's left posterior leg actually looks somewhat better. There is less edema, less erythema. Still has 3 open areas covered  with a necrotic surface requiring debridement. He claims to be using his compression pumps once a day his edema control is better 01/17/17; the patient's left posterior calf look better last week when I saw him and his wrap was changed 2 days ago. He has noted increasing pain in the left heel and arrives today with much larger wounds extensive erythema extending down into the entire heel area especially tender medially. He is not systemically unwell CBGs have been controlled no fever. Our intake nurse showed me limegreen drainage on his AVD pads. 01/24/17; his usual this patient responds nicely to antibiotics last week giving him Levaquin for presumed Pseudomonas. The whole entire posterior part of his leg is much better much less inflamed and in the case of his Achilles heel area much less tender. He has also had some epithelialization posteriorly there are still open areas here and still draining but overall considerably better 01/31/17- He has continue to tolerate the compression wraps. he states that he continues to use the lymphedema pumps daily, and can increase to twice daily on the weekends. He is voicing no complaints or concerns regarding his LLE ulcers 02/07/17-he is here for follow-up evaluation. He states that he noted some erythema to the left medial and anterior thigh, which he states is new as of yesterday. He is concerned about recurrent cellulitis. He states his blood sugars have been slightly elevated, this morning in the 180s 02/14/17; he is here for follow-up evaluation. When he was last here there was erythema superiorly from his posterior wound in his anterior thigh. He was prescribed Levaquin however a  culture of the wound surface grew MRSA over the phone I changed him to doxycycline on Monday and things seem to be a lot better. 02/24/17; patient missed his appointment on Friday therefore we changed his nurse visit into a physician visit today. Still using silver alginate on the large area of the posterior left thigh. He isn't new area on the dorsal left second toe 03/03/17; actually better today although he admits he has not used his external compression pumps in the last 2 days or so because of work responsibilities over the weekend. 03/10/17; continued improvement. External compression pumps once a day almost all of his wounds have closed on the posterior left calf. Better edema control 03/17/17; in general improved. He still has 3 small open areas on the lateral aspect of his left leg however most of the area on the posterior part of his leg is epithelialized. He has better edema control. He has an ABD pad under his stocking on the right anterior lower leg although he did not let us look at that today. 03/24/17; patient arrives back in clinic today with no open areas however there are areas on the posterior left calf and anterior left calf that are less than 100% epithelialized. His edema is well controlled in the left lower leg. There is some pitting edema probably lymphedema in the left upper thigh. He uses compression pumps at home once per day. I tried to get him to do this twice a day although he is very reticent. 04/01/2017 -- for the last 2 days he's had significant redness, tenderness and weeping and came in for an urgent visit today. 04/07/17; patient still has 6 more days of doxycycline. He was seen by Dr. Meyer Russel last Wednesday for cellulitis involving the posterior aspect, lateral aspect of his Involving his heel. For the most part he is better there is less erythema and less weeping.  He has been on his feet for 12 hours o2 over the weekend. Using his compression pumps once a  day 04/14/17 arrives today with continued improvement. Only one area on the posterior left calf that is not fully epithelialized. He has intense bilateral venous inflammation associated with his chronic venous insufficiency disease and secondary lymphedema. We have been using silver alginate to the left posterior calf wound In passing he tells [place] today that the right leg but we have not seen in quite some time has an open area on it but he doesn't want [place] to look at this today states he will show this to [place] next week. 04/21/17; there is no open area on his left leg although he still reports some weeping edema. He showed [place] his right leg today which is the first time we've seen this leg in a long time. He has a large area of open wound on the right leg anteriorly healthy granulation. Quite a bit of swelling in the right leg and some degree of venous inflammation. He told us about the right leg in passing last week but states that deterioration in the right leg really only happened over the weekend 04/28/17; there is no open area on the left leg although there is an irritated part on the posterior which is like a wrap injury. The wound on the right leg which was new from last week at least to [place] is a lot better. 05/05/17; still no open area on the left leg. Patient is using his new compression stocking which seems to be doing a good job of controlling the edema. He states he is using his compression pumps once per day. The right leg still has an open wound although it is better in terms of surface area. Required debridement. A lot of pain in the posterior right Achilles marked tenderness. Usually this type of presentation this patient gives concern for an active cellulitis 05/12/17; patient arrives today with his major wound from last week on the right lateral leg somewhat better. Still requiring debridement. He was using his compression stocking on the left leg however that is reopened with superficial  wounds anteriorly he did not have an open wound on this leg previously. He is still using his juxta light's once daily at night. He cannot find the time to do this in the morning as he has to be at work by 7 AM 05/19/17; right lateral leg wound looks improved. No debridement required. The concerning area is on the left posterior leg which appears to almost have a subcutaneous hemorrhagic component to it. We've been using silver alginate to all the wounds 05/26/17; the right lateral leg wound continues to look improved. However the area on the left posterior calf is a tightly adherent surface. [place] using silver alginate. Because of the weeping edema in his legs there is very little good alternatives. 06/02/17; the patient left here last week looking quite good. Major wound on the left posterior calf and a small one on the right lateral calf. Both of these look satisfactory. He tells me that by Wednesday he had noted increased pain in the left leg and drainage. He called on Thursday and Friday to get an appointment here but we were blocked. He did not go to urgent care or his primary physician. He thinks he had a fever on Thursday but did not actually take his temperature. He has not been using his compression pumps on the left leg because of pain. I advised  him to go to the emergency room today for IV antibiotics for stents of left leg cellulitis but he has refused I have asked him to take 2 days off work to keep his leg elevated and he has refused this as well. In view of this I'm going to call him and Augmentin and doxycycline. 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 However after a detailed discussion regarding the risk benefits and alternatives he  refuses to get admitted to the hospital. With no other recourse I will continue him on oral antibiotics as before and hopefully he'll have his infectious disease consultation this week. 06/16/2017 -- the patient was seen today by the nurse practitioner at infectious disease Ms. Dixon. 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 He has also been advise Lotrimin cream locally. she also had wise IV antibiotics for his condition if it flares up 06/23/17; patient arrives today with drainage bilaterally although the remaining wound on the left posterior calf after cleaning up today "highlighter yellow drainage" did not look too bad. Unfortunately he has had breakdown on the right anterior leg [previously this leg had not been open and he is using a black stocking] he went to see infectious disease and is been put on clindamycin 150 mg daily, I did not verify the dose although I'm not familiar with using clindamycin in this dosing range, perhaps for prophylaxisoo 06/27/17; I brought this patient back today to follow-up on the wound deterioration on the right lower leg together with surrounding cellulitis. I started him on doxycycline 4 days ago. This area looks better however he comes in today with intense cellulitis on the medial part of his left thigh. This is not have a wound in this area. Extremely tender. We've been using silver alginate to the wounds on the right lower leg left lower leg with bilateral 4 layer compression he is using his external compression pumps once a day 07/04/17; patient's left medial thigh cellulitis looks better. He has not been using his compression pumps as his insert said it was contraindicated with cellulitis. His right leg continues to make improvements all the wounds are still open. We only  have one remaining wound on the left posterior calf. Using silver alginate to all open areas. He is on doxycycline which I started a week ago and should be finishing I gave him Augmentin after Thursday's visit for the severe cellulitis on the left medial thigh which fortunately looks better 07/14/17; the patient's left medial thigh cellulitis has resolved. The cellulitis in his right lower calf on the right also looks better. All of his wounds are stable to improved we've been using silver alginate he has completed the antibiotics I have given him. He has clindamycin 150 mg once a day prescribed by infectious disease for prophylaxis, I've advised him to start this now. We have been using bilateral Unna boots over silver alginate to the wound areas 07/21/17; the patient is been to see infectious disease who noted his recurrent problems with cellulitis. He was not able to tolerate prophylactic clindamycin therefore  he is on amoxicillin 500 twice a day. He also had a second daily dose of Lasix added By Dr. Oneta Rack but he is not taking this. Nor is he being completely compliant with his compression pumps a especially not this week. He has 2 remaining wounds one on the right posterior lateral lower leg and one on the left posterior medial lower leg. 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 The patient has Unna boots bilaterally. 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 He has bilateral Unna boots for compression and silver alginate to his wounds. Arrives today with his legs looking as good as I have seen him in quite some time. Not surprisingly his wounds look better as well with improvement on the right lateral leg venous insufficiency wound and also the left medial leg. He is  still using the compression pumps once a day 08/11/17; both legs appear to be doing better wounds on the right lateral and left medial legs look better. Skin on the right leg quite good. He is been using silver alginate as the primary dressing. I'm going to use Anasept gel calcium alginate and maintain all the secondary dressings 08/18/17; the patient continues to actually do quite well. The area on his right lateral leg is just about closed the left medial also looks better although it is still moist in this area. His edema is well controlled we have been using Anasept gel with calcium alginate and the usual secondary dressings, 4 layer compression and once daily use of his compression pumps "always been able to manage 09/01/17; the patient continues to do reasonably well in spite of his trip to [place]. The area on the right lateral leg is epithelialized. Left is much better but still open. He has more edema and more chronic erythema on the left leg [venous inflammation] 09/08/17; he arrives today with no open wound on the right lateral leg and decently controlled edema. Unfortunately his left leg is not nearly as in his good situation as last week.he apparently had increasing edema starting on Saturday. He edema soaked through into his foot so used a plastic bag to walk around his home. The area on the medial right leg which was his open area is about the same however he has lost surface epithelium on the left lateral which is new and he has significant pain in the Achilles area of the left foot. He is already on amoxicillin chronically for prophylaxis of cellulitis in the left leg 09/15/17; he is completed a week of doxycycline and the cellulitis in the left posterior leg and Achilles area is as usual improved. He still has a lot of edema and fluid soaking through his dressings. There is no open wound on the right leg. He saw infectious disease NP today 09/22/17;As usual 1 we transition him  from our compression wraps to his stockings things did not go well. He has several small open areas on the right leg. He states this was caused by the compression wrap on his skin although he did not wear this with the stockings over them. He has several superficial areas on the left leg medially laterally posteriorly. He does not have any evidence of active cellulitis especially involving the left Achilles The patient is traveling from [facility] Saturday going to  [place]. He states he isn't attempting to get an appointment with a heel objects wound center there to change his dressings. I am not completely certain whether this will work 10/06/17; the patient came in on Friday for a nurse visit and the nurse reported that his legs actually look quite good. He arrives in clinic today for his regular follow-up visit. He has a new wound on his left third toe over the PIP probably caused by friction with his footwear. He has small areas on the left leg and a very superficial but epithelialized area on the right anterior lateral lower leg. Other than that his legs look as good as I've seen him in quite some time. We have been using silver alginate Review of systems; no chest pain no shortness of breath other than this a 10 point review of systems negative 10/20/17; seen by Dr. Meyer Russel last week. He had taken some antibiotics [doxycycline] that he had left over. Dr. Meyer Russel thought he had candida infection and declined to give him further antibiotics. He has a small wound remaining on the right lateral leg several areas on the left leg including a larger area on the left posterior several left medial and anterior and a small wound on the left lateral. The area on the left dorsal third toe looks a lot better. ROS; Gen.; no fever, respiratory no cough no sputum Cardiac no chest pain other than this 10 point review of system is negative 10/30/17; patient arrives today having fallen in the bathtub 3 days  ago. It took him a while to get up. He has pain and maceration in the wounds on his left leg which have deteriorated. He has not been using his pumps he also has some maceration on the right lateral leg. 11/03/17; patient continues to have weeping edema especially in the left leg. This saturates his dressings which were just put on on 12/27. As usual the doxycycline seems to take care of the cellulitis on his lower leg. He is not complaining of fever, chills, or other systemic symptoms. He states his leg feels a lot better on the doxycycline I gave him empirically. He also apparently gets injections at his primary doctor's officeo Rocephin for cellulitis prophylaxis. I didn't ask him about his compression pump compliance today I think that's probably marginal. Arrives in the clinic with all of his dressings primary and secondary macerated full of fluid and he has bilateral edema 11/10/17; the patient's right leg looks some better although there is still a cluster of wounds on the right lateral. The left leg is inflamed with almost circumferential skin loss medially to laterally although we are still maintaining anteriorly. He does not have overt cellulitis there is a lot of drainage. He is not using compression pumps. We have been using silver alginate to the wound areas, there are not a lot of options here 11/17/17; the patient's right leg continues to be stable although there is still open wounds, better than last week. The inflammation in the left leg is better. Still loss of surface layer epithelium especially posteriorly. There is no overt cellulitis in the amount of edema and his left leg is really quite good, tells me he is using his compression pumps once a day. 11/24/17; patient's right leg has a small superficial wound laterally this continues to improve. The inflammation in the left leg is still improving however we have continuous surface layer epithelial loss posteriorly. There is no  overt cellulitis in the amount of  edema in both legs is really quite good. He states he is using his compression pumps on the left leg once a day for 5 out of 7 days 12/01/17; very small superficial areas on the right lateral leg continue to improve. Edema control in both legs is better today. He has continued loss of surface epithelialization and left posterior calf although I think this is better. We have been using silver alginate with large number of absorptive secondary dressings 4 layer on the left Unna boot on the right at his request. He tells me he is using his compression pumps once a day 12/08/17; he has no open area on the right leg is edema control is good here. ooOn the left leg however he has marked erythema and tenderness breakdown of skin. He has what appears to be a wrap injury just distal to the popliteal fossa. 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 There was swelling erythema and breakdown in the right leg. He has much worse skin breakdown in the left leg as well multiple open areas medially and posteriorly as well as laterally. He tells me he has been using his compression pumps but tells me he feels that the drainage out of his leg is worse when he uses a compression pumps. To be fair to him he is been saying this for a while however I don't know that I have really been listening to this. I wonder if the compression pumps are working properly 12/22/17;. Once again he arrives with severe erythema, weeping edema from the left greater than right leg. Noncompliance with compression pumps. New this visit he is complaining of pain on the lateral aspect of the right leg and the medial aspect of his right  thigh. He apparently saw his cardiologist Dr. [name] who was ordered an echocardiogram area and I think this is a step in the right direction 12/25/17; started his doxycycline Monday night. There is still intense erythema of the right leg especially in the anterior thigh although there is less tenderness. The erythema around the wound on the right lateral calf also is less tender. He still complaining of pain in the left heel. His wounds are about the same right lateral left medial left lateral. Superficial but certainly not close to closure. He denies being systemically unwell no fever chills no abdominal pain no diarrhea 12/29/17; back in follow-up of his extensive right calf and right thigh cellulitis. I added amoxicillin to cover possible doxycycline resistant strep. This seems to of done the trick he is in much less pain there is much less erythema and swelling. He has his echocardiogram at 11:00 this morning. X-ray of the left heel was also negative. 01/05/18; the patient arrived with his edema under much better control. Now that he is retired he is able to use his compression pumps daily and sometimes twice a day per the patient. He has a wound on the right leg the lateral wound looks better. Area on the left leg also looks a lot better. He has no evidence of cellulitis in his bilateral thighs I had a quick peak at his echocardiogram. He is in normal ejection fraction and normal left ventricular function. He has moderate pulmonary hypertension moderately reduced  right ventricular function. One would have to wonder about chronic sleep apnea although he says he doesn't snore. He'll review the echocardiogram with his cardiologist. 01/12/18; the patient arrives with the edema in both legs under exemplary control. He is using his compression pumps daily and sometimes twice daily. His wound on the right lateral leg is just about closed. He still has some weeping areas on the posterior left calf and  lateral left calf although everything is just about closed here as well. I have spoken with Aldean Baker who is the patient's nurse practitioner and infectious disease. She was concerned that the patient had not understood that the parenteral penicillin injections he was receiving for cellulitis prophylaxis was actually benefiting him. I don't think the patient actually saw that I would tend to agree we were certainly dealing with less infections although he had a serious one last month. 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 He is healed. He'll be placed in juxtalite compression wraps and increase his lymphedema pumps to twice daily. We will follow up again next week to ensure there are no issues with the new regiment. 01/20/18-he is here for evaluation of bilateral lower extremity weeping edema. Yesterday he was placed in compression wrap to the right lower extremity and compression stocking to left lower shrubbery. He states he uses lymphedema pumps last night and again this morning and noted a blister to the left lower extremity. On exam he was noted to have drainage to the right lower extremity. He will be placed in Unna boots bilaterally and follow-up next week 01/26/18; patient was actually discharged a week ago to his own juxta light stockings only to return the next day with bilateral lower extremity weeping edema.he was placed in bilateral Unna boots. He arrives today with pain in the back of his left leg. There is no open area on the right leg however there is a linear/wrap injury on the left leg and weeping edema on the left leg posteriorly. I spoke with infectious disease about 10 days ago. They were disappointed that the patient elected to discontinue prophylactic intramuscular penicillin shots as they felt it was particularly beneficial in reducing the frequency of his cellulitis. I discussed this with the patient today. He does not share  this view. He'll definitely need antibiotics today. Finally he is traveling to [place] and trauma leaving this Saturday and returning a week later and he does not travel with his pumps. He is going by car 01/30/18; patient was seen 4 days ago and brought back in today for review of cellulitis in the left leg posteriorly. I put him on amoxicillin this really hasn't helped as much as I might like. He is also worried because he is traveling to [facility] trauma by car. Finally we will be rewrapping him. There is no open area on the right leg over his left leg has multiple weeping areas as usual 02/09/18; The same wrap on for 10 days. He did not pick up the last doxycycline I prescribed for him. He apparently took 4 days worth he already had. There is nothing open on his right leg and the edema control is really quite good. He's had damage in the left leg medially and laterally especially probably related to the prolonged use of Unna boots 02/12/18; the patient arrived in clinic today for a nurse visit/wrap change. He complained of a lot of pain in the left posterior calf. He is taking doxycycline that I previously  prescribed for him. Unfortunately even though he used his stockings and apparently used to compression pumps twice a day he has weeping edema coming out of the lateral part of his right leg. This is coming from the lower anterior lateral skin area. 02/16/18; the patient has finished his doxycycline and will finish the amoxicillin 2 days. The area of cellulitis in the left calf posteriorly has resolved. He is no longer having any pain. He tells me he is using his compression pumps at least once a day sometimes twice. 02/23/18; the patient finished his doxycycline and Amoxil last week. On Friday he noticed a small erythematous circle about the size of a quarter on the left lower leg just above his ankle. This rapidly expanded and he now has erythema on the lateral and posterior part of the thigh.  This is bright red. Also has an area on the dorsal foot just above his toes and a tender area just below the left popliteal fossa. He came off his prophylactic penicillin injections at his own insistence one or 2 months ago. This is obviously deteriorated since then 03/02/18; patient is on doxycycline and Amoxil. Culture I did last week of the weeping area on the back of his left calf grew group B strep. I have therefore renewed the amoxicillin 500 3 times a day for a further week. He has not been systemically unwell. Still complaining of an area of discomfort right under his left popliteal fossa. There is no open wound on the right leg. He tells me that he is using his pumps twice a day on most days 03/09/18; patient arrives in clinic today completing his amoxicillin today. The cellulitis on his left leg is better. Furthermore he tells me that he had intramuscular penicillin shots that his primary care office today. However he also states that the wrap on his right leg fell down shortly after leaving clinic last week. He developed a large blister that was present when he came in for a nurse visit later in the week and then he developed intense discomfort around this area.He tells me he is using his compression pumps 03/16/18; the patient has completed his doxycycline. The infectious part of this/cellulitis in the left heel area left popliteal area is a lot better. He has 2 open areas on the right calf. Still areas on the left calf but this is a lot better as well. 03/24/18; the patient arrives complaining of pain in the left popliteal area again. He thinks some of this is wrap injury. He has no open area on the right leg and really no open area on the left calf either except for the popliteal area. He claims to be compliant with the compression pumps 03/31/18; I gave him doxycycline last week because of cellulitis in the left popliteal area. This is a lot better although the surface epithelium is  denuded off and response to this. He arrives today with uncontrolled edema in the right calf area as well as a fingernail injury in the right lateral calf. There is only a few open areas on the left 04/06/18; I gave him amoxicillin doxycycline over the last 2 weeks that the amoxicillin should be completing currently. He is not complaining of any pain or systemic symptoms. The only open areas see has is on the right lateral lower leg paradoxically I cannot see anything on the left lower leg. He tells me he is using his compression pumps twice a day on most days. Silver alginate to the  wounds that are open under 4 layer compression 04/13/18; he completed antibiotics and has no new complaints. Using his compression pumps. Silver alginate that anything that's opened 04/20/18; he is using his compression pumps religiously. Silver alginate 4 layer compression anything that's opened. He comes in today with no open wounds on the left leg but 3 on the right including a new one posteriorly. He has 2 on the right lateral and one on the right posterior. He likes Unna boots on the right leg for reasons that aren't really clear we had the usual 4 layer compression on the left. It may be necessary to move to the 4 layer compression on the right however for now I left them in the Unna boots 04/27/18; he is using his compression pumps at least once a day. He has still the wounds on the right lateral calf. The area right posteriorly has closed. He does not have an open wound on the left under 4 layer compression however on the dorsal left foot just proximal to the toes and the left third toe 2 small open areas were identified 05/11/18; he has not uses compression pumps. The areas on the right lateral calf have coalesced into one large wound necrotic surface. On the left side he has one small wound anteriorly however the edema is now weeping out of a large part of his left leg. He says he wasn't using his pumps because of  the weeping fluid. I explained to him that this is the time he needs to pump more 05/18/18; patient states he is using his compression pumps twice a day. The area on the right lateral large wound albeit superficial. On the left side he has innumerable number of small new wounds on the left calf particularly laterally but several anteriorly and medially. All these appear to have healthy granulated base these look like the remnants of blisters however they occurred under compression. The patient arrives in clinic today with his legs somewhat better. There is certainly less edema, less multiple open areas on the left calf and the right anterior leg looks somewhat better as well superficial and a little smaller. However he relates pain and erythema over the last 3-4 days in the thigh and I looked at this today. He has not been systemically unwell no fever no chills no change in blood sugar values 05/25/18; comes in today in a better state. The severe cellulitis on his left leg seems better with the Keflex. Not as tender. He has not been systemically unwell ooHard to find an open wound on the left lower leg using his compression pumps twice a day ooThe confluent wounds on his right lateral calf somewhat better looking. These will ultimately need debridement I didn't do this today. 06/01/18; the severe cellulitis on the left anterior thigh has resolved and he is completed his Keflex. ooThere is no open wound on the left leg however there is a superficial excoriation at the base of the third toe dorsally. Skin on the bottom of his left foot is macerated looking. ooThe left the wounds on the lateral right leg actually looks some better although he did require debridement of the top half of this wound area with an open curet 06/09/18 on evaluation today patient appears to be doing poorly in regard to his right lower extremity in particular this appears to likely be infected he has very thick purulent  discharge along with a bright green tent to the discharge. This makes me concerned about the  possibility of pseudomonas. He's also having increased discomfort at this point on evaluation. Fortunately there does not appear to be any evidence of infection spreading to the other location at this time. 06/16/18 on evaluation today patient appears to actually be doing fairly well. His ulcer has actually diminished in size quite significantly at this point which is good news. Nonetheless he still does have some evidence of infection he did see infectious disease this morning before coming here for his appointment. I did review the results of their evaluation and their note today. They did actually have him discontinue the Cipro and initiate treatment with linezolid at this time. He is doing this for the next seven days and they recommended a follow-up in four months with them. He is the keep a log of the need for intermittent antibiotic therapy between now and when he falls back up with infectious disease. This will help them gaze what exactly they need to do to try and help them out. 06/23/18; the patient arrives today with no open wounds on the left leg and left third toe healed. He is been using his compression pumps twice a day. On the right lateral leg he still has a sizable wound but this is a lot better than last time I saw this. 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 Has been using silver alginate under 4 layer compression 06/30/18; the only open wound he has is on the right lateral leg and this looks healthy. No debridement is required. We have been using silver alginate. He does not have an open wound on the left leg. There is apparently some drainage from the dorsal proximal third toe on the left although I see no open wound here. 07/03/18 on evaluation today patient was actually here just  for a nurse visit rapid change. However when he was here on Wednesday for his rat change due to having been healed on the left and then developing blisters we initiated the wrap again knowing that he would be back today for us to reevaluate and see were at. Unfortunately he has developed some cellulitis into the proximal portion of his right lower extremity even into the region of his thigh. 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 He was placed on one as what at that point. Nonetheless he is done with that and has been tolerating it well otherwise. Doxycycline which in the past really did not seem to be effective for him. Nonetheless I think the best option may be for us to definitely reinitiate the antibiotics for a longer period of time. 07/07/18; since I last saw this patient a week ago he has had a difficult time. At that point he did not have an open wound on his left leg. We transitioned him into juxta light stockings. He was apparently in the clinic the next day with blisters on the left lateral and left medial lower calf. He also had weeping edema fluid. He was put back into a compression wrap. He was also in the clinic on Friday with intense erythema in his right thigh. Per the patient he was started on Bactrim however that didn't work at all in terms of relieving his pain and swelling. He has taken 3 doxycycline that he had left over from last time and that seems to of helped. He has blistering on  the right thigh as well. 07/14/18; the erythema on his right thigh has gotten better with doxycycline that he is finishing. 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 Presumably this cellulitis in the thigh was not related to that although I think this is a bit concerning going forward. He still has an area on the right lateral calf the blister on the right medial calf just below  the knee that was discussed above. On the left 2 small open areas left medial and left lateral. Edema control is adequate. He is using his compression pumps twice a day 07/20/18; continued improvement in the condition of both legs especially the edema in his bilateral thighs. He tells me he is been losing weight through a combination of diet and exercise. He is using his compression pumps twice a day. So overall she made to the remaining wounds 07/27/2018; continued improvement in condition of both legs. His edema is well controlled. The area on the right lateral leg is just about closed he had one blisters show up on the medial left upper calf. We have him in 4 layer compression. He is going on a 10-day trip to [place], [place] and [place]. He will be driving. He wants to wear Unna boots because of the lessening amount of constriction. He will not use compression pumps while he is away 08/05/18 on evaluation today patient actually appears to be doing decently well all things considered in regard to his bilateral lower extremities. The worst ulcer is actually only posterior aspect of his left lower extremity with a four layer compression wrap cut into his leg a couple weeks back. He did have a trip and actually had [employer] for the trip that he is worn since he was last here. Nonetheless he feels like the [employer] actually do better for him his swelling is up a little bit but he also with his trip was not taking his Lasix on a regular set schedule like he was supposed to be. He states that obviously the reason being that he cannot drive and keep going without having to urinate too frequently which makes it difficult. He did not have his pumps with him while he was away either which I think also maybe playing a role here too. 08/13/2018; the patient only has a small open wound on the right lateral calf which is a big improvement in the last month or 2. He also has the area  posteriorly just below the posterior fossa on the left which I think was a wrap injury from several weeks ago. He has no current evidence of cellulitis. He tells me he is back into his compression pumps twice a day. He also tells me that while he was at the laundromat somebody stole a section of his extremitease stockings 08/20/2018; back in the clinic with a much improved state. He only has small areas on the right lateral mid calf which is just about healed. This was is more substantial area for quite a prolonged period of time. He has a small open area on the left anterior tibia. The area on the posterior calf just below the popliteal fossa is closed today. He is using his compression pumps twice a day 08/28/2018; patient has no open wound on the right leg. He has a smattering of open areas on the calf with some weeping lymphedema. More problematically than that it looks  as though his wraps of slipped down in his usual he has very angry upper area of edema just below the right medial knee and on the right lateral calf. He has no open area on his feet. The patient is traveling to [facility] next week. I will send him in an antibiotic. We will continue to wrap the right leg. We ordered extremitease stockings for him last week and I plan to transition the right leg to a stocking when he gets home which will be in 10 days time. As usual he is very reluctant to take his pumps with him when he travels 09/07/2018; patient returns from [facility]. He shows me a picture of his left leg in the mid part of his trip last week with intense fire engine erythema. The picture look bad enough I would have considered sending him to the hospital. Instead he went to the wound care center in [facility]. They did not prescribe him antibiotics but he did take some doxycycline he had leftover from a previous visit. I had given him trimethoprim sulfamethoxazole before he left this did not work according  to the patient. This is resulted in some improvement fortunately. He comes back with a large wound on the left posterior calf. Smaller area on the left anterior tibia. Denuded blisters on the dorsal left foot over his toes. Does not have much in the way of wounds on the right leg although he does have a very tender area on the right posterior area just below the popliteal fossa also suggestive of infection. He promises me he is back on his pumps twice a day 09/15/2018; the intense cellulitis in his left lower calf is a lot better. The wound area on the posterior left calf is also so better. However he has reasonably extensive wounds on the dorsal aspect of his second and third toes and the proximal foot just at the base of the toes. There is nothing open on the right leg 09/22/2018; the patient has excellent edema control in his legs bilaterally. He is using his external compression pumps twice a day. He has no open area on the right leg and only the areas in the left foot dorsally second and third toe area on the left side. He does not have any signs of active cellulitis. 10/06/2018; the patient has good edema control bilaterally. He has no open wound on the right leg. There is a blister in the posterior aspect of his left calf that we had to deal with today. He is using his compression pumps twice a day. There is no signs of active cellulitis. We have been using silver alginate to the wound areas. He still has vulnerable areas on the base of his left first second toes dorsally He has a his extremities stockings and we are going to transition him today into the stocking on the right leg. He is cautioned that he will need to continue to use the compression pumps twice a day. If he notices uncontrolled edema in the right leg he may need to go to 3 times a day. 10/13/2018; the patient came in for a nurse check on Friday he has a large flaccid blister on the right medial calf just below the knee. We  unroofed this. He has this and a new area underneath the posterior mid calf which was undoubtedly a blister as well. He also has several small areas on the right which is the area we put his extremities stocking  on. 10/19/2018; the patient went to see infectious disease this morning I am not sure if that was a routine follow-up in any case the doxycycline I had given him was discontinued and started on linezolid. He has not started this. It is easy to look at his left calf and the inflammation and think this is cellulitis however he is very tender in the tissue just below the popliteal fossa and I have no doubt that there is infection going on here. He states the problem he is having is that with the compression pumps the edema goes down and then starts walking the wrap falls down. We will see if we can adhere this. He has 1 or 2 minuscule open areas on the right still areas that are weeping on the posterior left calf, the base of his left second and third toes 10/26/18; back today in clinic with quite of skin breakdown in his left anterior leg. This may have been infection the area below the popliteal fossa seems a lot better however tremendous epithelial loss on the left anterior mid tibia area over quite inexpensive tissue. He has 2 blisters on the right side but no other open wound here. 10/29/2018; came in urgently to see [place] today and we worked him in for review. He states that the 4 layer compression on the right leg caused pain he had to cut it down to roughly his mid calf this caused swelling above the wrap and he has blisters and skin breakdown today. As a result of the pain he has not been using his pumps. Both legs are a lot more edematous and there is a lot of weeping fluid. 11/02/18; arrives in clinic with continued difficulties in the right leg> left. Leg is swollen and painful. multiple skin blisters and new open areas especially laterally. He has not been using his pumps on the  right leg. 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 He is not systemically unwell. 11/09/2018; the patient claims he is being compliant with his pumps. He is finished the doxycycline I gave him last week. Culture I did of the wound on the right lateral leg showed a few very resistant methicillin staph aureus. This was resistant to doxycycline. Nevertheless he states the pain in the leg is a lot better which makes me wonder if the cultured organism was not really what was causing the problem nevertheless this is a very dangerous organism to be culturing out of any wound. His right leg is still a lot larger than the left. He is using an Radio broadcast assistant on this area, he blames a 4-layer compression for causing the original skin breakdown which I doubt is true however I cannot talk him out of it. We have been using silver alginate to all of these areas which were initially blisters 11/16/2018; patient is being compliant with his external compression pumps at twice a day. Miraculously he arrives in clinic today with absolutely no open wounds. He has better edema control on the left where he has been using 4 layer compression versus wound of wounds on the right and I pointed this out to him. There is no inflammation in the skin in his lower legs which is also somewhat unusual for him. There is no open wounds on the dorsal left foot. He has extremitease stockings at home and I have asked him to bring these in next week. 11/25/18 patient's lower extremity on examination today on the left appears  for the most part to be wound free. He does have an open wound on the lateral aspect of the right lower extremity but this is minimal compared to what I've seen in past. He does request that we go ahead and wrap the left leg as well even though there's nothing open just so hopefully it will not reopen in short order. 1/28; patient has superficial open wounds on the right  lateral calf left anterior calf and left posterior calf. His edema control is adequate. 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 We have been using silver alginate as the primary dressing. He claims compliance with his compression pumps 2/4; patient has superficial open wounds on numerous areas of his left calf and again one on the left dorsal foot. The areas on the right lateral calf have healed. The cellulitis that I gave him doxycycline for last week is also resolved this was mostly on the left anterior calf just below the tibial tuberosity. His edema looks fairly well-controlled. He tells me he went to see his primary doctor today and had blood work ordered 2/11; once again he has several open areas on the left calf left tibial area. Most of these are small and appear to have healthy granulation. He does not have anything open on the right. The edema and control in his thighs is pretty good which is usually a good indication he has been using his pumps as requested. 2/18; he continues to have several small areas on the left calf and left tibial area. Most of these are small healthy granulation. We put him in his stocking on the right leg last week and he arrives with a superficial open area over the right upper tibia and a fairly large area on the right lateral tibia in similar condition. His edema control actually does not look too bad, he claims to be using his compression pumps twice a day 2/25. Continued small areas on the left calf and left tibial area. New areas especially on the right are identified just below the tibial tuberosity and on the right upper tibia itself. There are also areas of weeping edema fluid even without an obvious wound. He does not have a considerable degree of lymphedema but clearly there is more edema here than his skin can handle. He states he is using the pumps twice a day.  We have an Unna boot on the right and 4 layer compression on the left. 3/3; he continues to have an area on the right lateral calf and right posterior calf just below the popliteal fossa. There is a fair amount of tenderness around the wound on the popliteal fossa but I did not see any evidence of cellulitis, could just be that the wrap came down and rubbed in this area. ooHe does not have an open area on the left leg however there is an area on the left dorsal foot at the base of the third toe ooWe have been using silver alginate to all wound areas 3/10; he did not have an open area on his left leg last time he was here a week ago. Today he arrives with a horizontal wound just below the tibial tuberosity and an area on the left lateral calf. He has intense erythema and tenderness in this area. The area is on the right lateral calf and right posterior calf better than last week. We have been using silver  alginate as usual 3/18 - Patient returns with 3 small open areas on left calf, and 1 small open area on right calf, the skin looks ok with no significant erythema, he continues the UNA boot on right and 4 layer compression on left. The right lateral calf wound is closed , the right posterior is small area. we will continue silver alginate to the areas. 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 He is been tolerating the dressing changes without complication. Fortunately she has made excellent improvement in regard to the overall status of his wounds. Unfortunately every time we cease wrapping him he ends up reopening in causing more significant issues at that point. Again I'm unsure of the best direction to take although I think the lymphedema clinic may be appropriate for him. 02/03/19 on evaluation  today patient appears to be doing well in regard to the wounds that we saw him for last week unfortunately he has a new area on the proximal portion of his right medial/posterior lower extremity where the wrap somewhat slowed down and caused swelling and a blister to rub and open. Unfortunately this is the only opening that he has on either leg at this point. 02/17/19 on evaluation today patient's bilateral lower extremities appear to be doing well. He still completely healed in regard to the left lower extremity. In regard to the right lower extremity the area where the wrap and slid down and caused the blister still seems to be slightly open although this is dramatically better than during the last evaluation two weeks ago. I'm very pleased with the way this stands overall. 03/03/19 on evaluation today patient appears to be doing well in regard to his right lower extremity in general although he did have a new blister open this does not appear to be showing any evidence of active infection at this time. Fortunately there's No fevers, chills, nausea, or vomiting noted at this time. Overall I feel like he is making good progress it does feel like that the right leg will we perform the [employer] seems to do with a bit better than three layer wrap on the left which slid down on him. We may switch to doing bilateral in the book wraps. 5/4; I have not seen Mr. Tenpenny in quite some time. According to our case manager he did not have an open wound on his left leg last week. He had 1 remaining wound on the right posterior medial calf. He arrives today with multiple openings on the left leg probably were blisters and/or wrap injuries from Unna boots. I do not think the Unna boot's will provide adequate compression on the left. I am also not clear about the frequency he is using the compression pumps. 03/17/19 on evaluation today patient appears to be doing excellent in regard to his lower extremities compared  to last week's evaluation apparently. He had gotten significantly worse last week which is unfortunate. The [employer] wrap on the left did not seem to do very well for him at all and in fact it didn't control his swelling significantly enough he had an additional outbreak. Subsequently we go back to the four layer compression wrap on the left. This is good news. At least in that he is doing better and the wound seem to be killing  him. He still has not heard anything from the lymphedema clinic. 03/24/19 on evaluation today patient actually appears to be doing much better in regard to his bilateral lower [place] as compared to last week when I saw him. Fortunately there's no signs of active infection at this time. He has been tolerating the dressing changes without complication. Overall I'm extremely pleased with the progress and appearance in general. 04/07/19 on evaluation today patient appears to be doing well in regard to his bilateral lower extremities. His swelling is significantly down from where it was previous. With that being said he does have a couple blisters still open at this point but fortunately nothing that seems to be too severe and again the majority of the larger openings has healed at this time. 04/14/19 on evaluation today patient actually appears to be doing quite well in regard to his bilateral lower extremities in fact I'm not even sure there's anything significantly open at this time at any site. Nonetheless he did have some trouble with these wraps where they are somewhat irritating him secondary to the fact that he has noted that the graph wasn't too close down to the end of this foot in a little bit short as well up to his knee. Otherwise things seem to be doing quite well. 04/21/19 upon evaluation today patient's wound bed actually showed evidence of being completely healed in regard to both lower extremities which is excellent news. There does not appear to be any signs of  active infection which is also good news. I'm very pleased in this regard. No fevers, chills, nausea, or vomiting noted at this time. 04/28/19 on evaluation today patient appears to be doing a little bit worse in regard to both lower extremities on the left mainly due to the fact that when he went infection disease the wrap was not wrapped quite high enough he developed a blister above this. On the right he is a small open area of nothing too significant but again this is continuing to give him some trouble he has been were in the Velcro compression that he has at home. 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 He's been tolerating the dressing changes without complication. Fortunately there's no signs of active infection at this time. No fevers, chills, nausea, or vomiting noted at this time. We have been trying to get an appointment with her lymphedema clinic in [facility] but unfortunately nobody can get them on phone with not been able to even fax information over the patient likewise is not been able to get in touch with them. Overall I'm not sure exactly what's going on here with to reach out again today. 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 Still having a lot of drainage unfortunately. He tells me especially in the left but even on the right. There's no signs of active infection which is good news we've been using so ratcheted up to this point. 05/19/19 on evaluation today patient actually appears to be doing quite well with regard to his left lower extremity which is great news. Fortunately in regard to the right lower extremity has an issues with his wrap and he subsequently did remove this from what I'm understanding. Nonetheless long story short is what he had rewrapped once he removed it subsequently had maggots  underneath this wrap whenever he came in for  evaluation today. With that being said they were obviously completely cleaned away by the nursing staff. The visit today which is excellent news. However he does appear to potentially have some infection around the right ankle region where the maggots were located as well. He will likely require anabiotic therapy today. 05/26/19 on evaluation today patient actually appears to be doing much better in regard to his bilateral lower extremities. I feel like the infection is under much better control. With that being said there were maggots noted when the wrap was removed yet again today. Again this could have potentially been left over from previous although at this time there does not appear to be any signs of significant drainage there was obviously on the wrap some drainage as well this contracted gnats or otherwise. Either way I do not see anything that appears to be doing worse in my pinion and in fact I think his drainage has slowed down quite significantly likely mainly due to the fact to his infection being under better control. 06/02/2019 on evaluation today patient actually appears to be doing well with regard to his bilateral lower extremities there is no signs of active infection at this time which is great news. With that being said he does have several open areas more so on the right than the left but nonetheless these are all significantly better than previously noted. 06/09/2019 on evaluation today patient actually appears to be doing well. His wrap stayed up and he did not cause any problems he had more drainage on the right compared to the left but overall I do not see any major issues at this time which is great news. 06/16/2019 on evaluation today patient appears to be doing excellent with regard to his lower extremities the only area that is open is a new blister that can have opened as of today on the medial ankle on the left. Other than  this he really seems to be doing great I see no major issues at this point. 06/23/2019 on evaluation today patient appears to be doing quite well with regard to his bilateral lower extremities. In fact he actually appears to be almost completely healed there is a small area of weeping noted of the right lower extremity just above the ankle. Nonetheless fortunately there is no signs of active infection at this time which is good news. No fevers, chills, nausea, vomiting, or diarrhea. 8/24; the patient arrived for a nurse visit today but complained of very significant pain in the left leg and therefore I was asked to look at this. Noted that he did not have an open area on the left leg last week nevertheless this was wrapped. The patient states that he is not been able to put his compression pumps on the left leg because of the discomfort. He has not been systemically unwell 06/30/2019 on evaluation today patient unfortunately despite being excellent last week is doing much worse with regard to his left lower extremity today. In fact he had to come in for a nurse on Monday where his left leg had to be rewrapped due to excessive weeping Dr. Leanord Hawking placed him on doxycycline at that point. Fortunately there is no signs of active infection Systemically at this time which is good news. 07/07/2019 in regard to the patient's wounds today he actually seems to be doing well with his right lower extremity there really is nothing open or draining at this point this is great news. Unfortunately the left lower extremity is  given him additional trouble at this time. There does not appear to be any signs of active infection nonetheless he does have a lot of edema and swelling noted at this point as well as blistering all of which has led to a much more poor appearing leg at this time compared to where it was 2 weeks ago when it was almost completely healed. Obviously this is a little discouraging for the patient. He  is try to contact the lymphedema clinic in [place] he has not been able to get through to them. 07/14/2019 on evaluation today patient actually appears to be doing slightly better with regard to his left lower extremity ulcers. Overall I do feel like at least at the top of the wrap that we have been placing this area has healed quite nicely and looks much better. The remainder of the leg is showing signs of improvement. Unfortunately in the thigh area he still has an open region on the left and again on the right he has been utilizing just a Band-Aid on an area that also opened on the thigh. Again this is an area that were not able to wrap although we did do an Ace wrap to provide some compression that something that obviously is a little less effective than the compression wraps we have been using on the lower portion of the leg. He does have an appointment with the lymphedema clinic in [facility] on Friday. 07/21/2019 on evaluation today patient appears to be doing better with regard to his lower extremity ulcers. He has been tolerating the dressing changes without complication. Fortunately there is no signs of active infection at this time. No fevers, chills, nausea, vomiting, or diarrhea. I did receive the paperwork from the physical therapist at the lymphedema clinic in [place]. Subsequently I signed off on that this morning and sent that back to him for further progression with the treatment plan. 07/28/2019 on evaluation today patient appears to be doing very well with regard to his right lower extremity where I do not see any open wounds at this point. Fortunately he is feeling great as far as that is concerned as well. In regard to the left lower extremity he has been having issues with still several areas of weeping and edema although the upper leg is doing better his lower leg still I think is going require the compression wrap at this time. No fevers, chills, nausea,  vomiting, or diarrhea. 08/04/2019 on evaluation today patient unfortunately is having new wounds on the right lower extremity. Again we have been using Unna boot wrap on that side. We switched him to using his juxta lite wrap at home. With that being said he tells me he has been using it although his legs extremely swollen and to be honest really does not appear that he has been. I cannot know that for sure however. Nonetheless he has multiple new wounds on the right lower extremity at this time. Obviously we will have to see about getting this rewrapped for him today. 08/11/2019 on evaluation today patient appears to be doing fairly well with regard to his wounds. He has been tolerating the dressing changes including the compression wraps without complication. He still has a lot of edema in his upper thigh regions bilaterally he is supposed to be seeing the lymphedema clinic on the 15th of this month once his wraps arrive for the upper part of his legs. 08/18/2019 on evaluation today patient appears to be doing well with regard to  his bilateral lower extremities at this point. He has been tolerating the dressing changes without complication. Fortunately there is no signs of active infection which is also good news. He does have a couple weeping areas on the first and second toe of the right foot he also has just a small area on the left foot upper leg and a small area on the left lower leg but overall he is doing quite well in my opinion. He is supposed to be getting his wraps shortly in fact tomorrow and then subsequently is seeing the lymphedema clinic next Wednesday on the 21st. Of note he is also leaving on the 25th to go on vacation for a week to the beach. For that reason and since there is some uncertainty about what there can be doing at lymphedema clinic next Wednesday I am get a make an appointment for next Friday here for [place] to see what we need to do for him prior to him leaving for  vacation. 10/23; patient arrives in considerable pain predominantly in the upper posterior calf just distal to the popliteal fossa also in the wound anteriorly above the major wound. This is probably cellulitis and he has had this recurrently in the past. He has no open wound on the right side and he has had an Radio broadcast assistant in that area. Finally I note that he has an area on the left posterior calf which by enlarge is mostly epithelialized. This protrudes beyond the borders of the surrounding skin in the setting of dry scaly skin and lymphedema. The patient is leaving for [facility] on Sunday. Per his longstanding pattern, he will not take his compression pumps with him predominantly out of fear that they will be stolen. He therefore asked that we put a Unna boot back on the right leg. He will also contact the wound care center in [facility] to see if they can change his dressing in the mid week. 11/3; patient returned from his vacation to [facility]. He was seen on 1 occasion at their wound care center. They did a 2 layer compression system as they did not have our 4-layer wrap. I am not completely certain what they put on the wounds. They did not change the Unna boot on the right. The patient is also seeing a lymphedema specialist physical therapist in [place]. It appears that he has some compression sleeve for his thighs which indeed look quite a bit better than I am used to seeing. He pumps over these with his external compression pumps. 11/10; the patient has a new wound on the right medial thigh otherwise there is no open areas on the right. He has an area on the left leg posteriorly anteriorly and medially and an area over the left second toe. We have been using silver alginate. He thinks the injury on his thigh is secondary to friction from the compression sleeve he has. 11/17; the patient has a new wound on the right medial thigh last week. He thinks this is because he did not  have a underlying stocking for his thigh juxta lite apparatus. He now has this. The area is fairly large and somewhat angry but I do not think he has underlying cellulitis. ooHe has a intact blister on the right anterior tibial area. ooSmall wound on the right great toe dorsally ooSmall area on the medial left calf. 11/30; the patient does not have any open areas on his right leg and we did not take his juxta  lite stocking off. However he states that on Friday his compression wrap fell down lodging around his upper mid calf area. As usual this creates a lot of problems for him. He called urgently today to be seen for a nurse visit however the nurse visit turned into a provider visit because of extreme erythema and pain in the left anterior tibia extending laterally and posteriorly. The area that is problematic is extensive 10/06/2019 upon evaluation today patient actually appears to be doing poorly in regard to his left lower extremity. He Dr. Leanord Hawking did place him on doxycycline this past Monday apparently due to the fact that he was doing much worse in regard to this left leg. Fortunately the doxycycline does seem to be helping. Unfortunately we are still having a very difficult time getting his edema under any type of control in order to anticipate discharge at some point. The only way were really able to control his lymphedema really is with compression wraps and that has only even seemingly temporary. He has been seeing a lymphedema clinic they are trying to help in this regard but still this has been somewhat frustrating in general for the patient. 10/13/19 on evaluation today patient appears to be doing excellent with regard to his right lower extremity as far as the wounds are concerned. His swelling is still quite extensive unfortunately. He is still having a lot of drainage from the thigh areas bilaterally which is unfortunate. He's been going to lymphedema clinic but again he still  really does not have this edema under control as far as his lower extremities are concern. With regard to his left lower extremity this seems to be improving and I do believe the doxycycline has been of benefit for him. He is about to complete the doxycycline. 10/20/2019 on evaluation today patient appears to be doing poorly in regard to his bilateral lower extremities. More in the right thigh he has a lot of irritation at this site unfortunately. In regard to the left lower extremity the wrap was not quite as high it appears and does seem to have caused him some trouble as well. Fortunately there is no evidence of systemic infection though he does have some blue-green drainage which has me concerned for the possibility of Pseudomonas. He tells me he is previously taking Cipro without complications and he really does not care for Levaquin however due to some of the side effects he has. He is not allergic to any medications specifically antibiotics that were aware of. 10/27/2019 on evaluation today patient actually does appear to be for the most part doing better when compared to last week's evaluation. With that being said he still has multiple open wounds over the bilateral lower extremities. He actually forgot to start taking the Cipro and states that he still has the whole bottle. He does have several new blisters on left lower extremity today I think I would recommend he go ahead and take the Cipro based on what I am seeing at this point. 12/30-Patient comes at 1 week visit, 4 layer compression wraps on the left and Unna boot on the right, primary dressing Xtrasorb and silver alginate. Patient is taking his Cipro and has a few more days left probably 5-6, and the legs are doing better. He states he is using his compressions devices which I believe he has 11/10/2019 on evaluation today patient actually appears to be much better than last time I saw him 2 weeks ago. His wounds are significantly  improved  and overall I am very pleased in this regard. Fortunately there is no signs of active infection at this time. He is just a couple of days away from completing Cipro. Overall his edema is much better he has been using his lymphedema pumps which I think is also helping at this point. 11/17/2019 on evaluation today patient appears to be doing excellent in regard to his wounds in general. His legs are swollen but not nearly as much as they have been in the past. Fortunately he is tolerating the compression wraps without complication. No fevers, chills, nausea, vomiting, or diarrhea. He does have some erythema however in the distal portion of his right lower extremity specifically around the forefoot and toes there is a little bit of warmth here as well. 11/24/2019 on evaluation today patient appears to be doing well with regard to his right lower extremity I really do not see any open wounds at this point. His left lower extremity does have several open areas and his right medial thigh also is open. Other than this however overall the patient seems to be making good progress and I am very pleased at this point. 12/01/2019 on evaluation today patient appears to be doing poorly at this point in regard to his left lower extremity has several new blisters despite the fact that we have him in compression wraps. In fact he had a 4-layer compression wrap, his upper thigh wrapped from lymphedema clinic, and a juxta light over top of the 4 layer compression wrap the lymphedema clinic applied and despite all this he still develop blisters underneath. Obviously this does have me concerned about the fact that unfortunately despite what we are doing to try to get wounds healed he continues to have new areas arise I do not think he is ever good to be at the point where he can realistically just use wraps at home to keep things under control. Typically when we heal him it takes about 1-2 days before he is back  in the clinic with severe breakdown and blistering of his lower extremities bilaterally. This is happened numerous times in the past. Unfortunately I think that we may need some help as far as overall fluid overload to kind of limit what we are seeing and get things under better control. 12/08/2019 on evaluation today patient presents for follow-up concerning his ongoing bilateral lower extremity edema. Unfortunately he is still having quite a bit of swelling the compression wraps are controlling this to some degree but he did see Dr. [name] his cardiologist I do have that available for review today as far as the appointment was concerned that was on 12/06/2019. Obviously that she has been 2 days ago. The patient states that he is only been taking the Lasix 80 mg 1 time a day he had told me previously he was taking this twice a day. Nonetheless Dr. [name] recommended this be up to 80 mg 2 times a day for the patient as he did appear to be fluid overloaded. With that being said the patient states he did this yesterday and he was unable to go anywhere or do anything due to the fact that he was constantly having to urinate. Nonetheless I think that this is still good to be something that is important for him as far as trying to get his edema under control at all things that he is going to be able to just expect his wounds to get under control and things to be better without  going through at least a period of time where he is trying to stabilize his fluid management in general and I think increasing the Lasix is likely the first step here. It was also mentioned the possibility that the patient may require metolazone. With that being said he wanted to have the patient take Lasix twice a day first and then reevaluating 2 months to see where things stand. 12/15/2019 upon evaluation today patient appears to be doing regard to his legs although his toes are showing some signs of weeping especially on the left at  this point to some degree on the right. There does not appear to be any signs of active infection and overall I do feel like the compression wraps are doing well for him but he has not been able to take the Lasix at home and the increased dose that Dr. [name] recommended. He tells me that just not go to be feasible for him. Nonetheless I think in this case he should probably send a message to Dr. [name] in order to discuss options from the standpoint of possible admission to get the fluid off or otherwise going forward. 12/22/2019 upon evaluation today patient appears to be doing fairly well with regard to his lower extremities at this point. In fact he would be doing excellent if it was not for the fact that his right anterior thigh apparently had an allergic reaction to adhesive tape that he used. The wound itself that we have been monitoring actually appears to be healed. There is a lot of irritation at this point. 12/29/2019 upon evaluation today patient appears to be doing well in regard to his lower extremities. His left medial thigh is open and somewhat draining today but this is the only region that is open the right has done much better with the treatment utilizing the steroid cream that I prescribed for him last week. Overall I am pleased in that regard. Fortunately there is no signs of active infection at this time. No fevers, chills, nausea, vomiting, or diarrhea. 01/05/2020 upon evaluation today patient appears to be doing more poorly in regard to his right lower extremity at this point upon evaluation today. Unfortunately he continues to have issues in this regard and I think the biggest issue is controlling his edema. This obviously is not very well controlled at this point is been recommended that he use the Lasix twice a day but he has not been able to do that. Unfortunately I think this is leading to an issue where honestly he is not really able to effectively control his edema and  therefore the wounds really are not doing significantly better. I do not think that he is going to be able to keep things under good control unless he is able to control his edema much better. I discussed this again in great detail with him today. 01/12/2020 good news is patient actually appears to be doing quite well today at this point. He does have an appointment with lymphedema clinic tomorrow. His legs appear healed and the toe on the left is almost completely healed. In general I am very pleased with how things stand at this point. Objective Constitutional Well-nourished and well-hydrated in no acute distress. Vitals Time Taken: 10:35 AM, Height: 70 in, Weight: 380.2 lbs, BMI: 54.5, Temperature: 97.8 F, Pulse: 61 bpm, Respiratory Rate: 20 breaths/min, Blood Pressure: 163/81 mmHg. Respiratory normal breathing without difficulty. Psychiatric this patient is able to make decisions and demonstrates good insight into disease process. Alert  and Oriented x 3. pleasant and cooperative. General Notes: Patient's wound bed currently showed signs of good granulation on the 4th toe region of the left foot the rest of the wounds over his lower extremities appear to be completely healed and are doing quite well. I am very pleased with how things stand at this time. Integumentary (Hair, Skin) Wound #159 status is Healed - Epithelialized. Original cause of wound was Blister. The wound is located on the Right,Lateral Lower Leg. The wound measures 0cm length x 0cm width x 0cm depth; 0cm^2 area and 0cm^3 volume. Wound #160 status is Open. Original cause of wound was Gradually Appeared. The wound is located on the Left Toe Fourth. The wound measures 0.6cm length x 0.3cm width x 0.1cm depth; 0.141cm^2 area and 0.014cm^3 volume. There is no tunneling or undermining noted. There is a small amount of serosanguineous drainage noted. The wound margin is distinct with the outline attached to the wound base.  There is large (67-100%) pink granulation within the wound bed. There is a small (1-33%) amount of necrotic tissue within the wound bed including Adherent Slough. 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 Post procedure Diagnosis Wound #: Same as Pre-Procedure There was a Four Layer Compression Therapy Procedure by Yevonne Pax, RN. Post procedure Diagnosis Wound #: Same as Pre-Procedure Plan Follow-up Appointments: Return Appointment in 1 week. Dressing Change Frequency: Wound #160 Left Toe Fourth: Do not change entire dressing for one week. - both legs Skin Barriers/Peri-Wound Care: Moisturizing lotion - both legs TCA Cream or Ointment - mixed with lotion Wound Cleansing: May shower with protection. Primary Wound Dressing: Wound #160 Left Toe Fourth: Calcium Alginate with Silver Secondary Dressing: Wound #160 Left Toe Fourth: Kerlix/Rolled Gauze Dry Gauze Drawtex Edema Control: 4 layer compression: Left lower extremity Unna Boot to Right Lower Extremity Avoid standing for long periods of time Elevate legs to the level of the heart or above for 30 minutes daily and/or when sitting, a frequency of: - throughout the day Exercise regularly Segmental Compressive Device. - lymphadema pumps 60 min 2 times per day Other: - lymphadema wraps to upper leg per [facility] clinic 1. My suggestion currently is good to be that we go ahead and continue with the current wound care measures with regard to  the bilateral wraps currently. This includes using a 4-layer compression wrap on the left lower extremity and an Unna boot on the right lower extremity. With that being said I think next week will be ready to transfer him into his Velcro wraps I did ask him to discuss with lymphedema clinic tomorrow what exactly they want him to be wearing going forward. 2. For the time being we will continue with the alginate dressing to the toe and again with the wraps on the remainder of the leg that is really about all we need at this point. 3. I do recommend he continue to elevate his legs much as possible. Obviously the more he can do the better in that regard. 4. I am going to suggest as well that the patient needs to bring his compression  wraps for the bilateral lower extremities for next week and again working to see about making that referral back to the lymphedema clinic after I see him next week with his appropriate wraps on and will see where things go from there. We will see patient back for reevaluation in 1 week here in the clinic. If anything worsens or changes patient will contact our office for additional recommendations. Electronic Signature(s) Signed: 01/12/2020 11:23:33 AM By: Lenda Kelp PA-C Entered By: Lenda Kelp on 01/12/2020 11:23:33 -------------------------------------------------------------------------------- SuperBill Details Patient Name: Date of Service: [name], [name] 01/12/2020 Medical Record [id number] Patient Account Number: [id number] Date of Birth/Sex: Treating RN: [birth year] (69 y.o. 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 of bilateral lower extremity I89.0 Lymphedema, not elsewhere classified E11.622 Type 2 diabetes mellitus with other skin ulcer 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, unspecified L03.116 Cellulitis of left lower limb Facility Procedures CPT4 Code Description: [id number] (Facility Use Only) [phone] - APPLY MULTLAY COMPRS LWR LT LEG Modifier: 59 Quantity: 1 CPT4 Code Description: [id number] (Facility Use Only) [id number] - APPLY UNNA BOOT RT Modifier: Quantity: 1 Physician Procedures CPT4: Description Modifier Quantity Code [id number] 99213 - WC PHYS LEVEL 3 - EST PT 1 ICD-10 Diagnosis Description 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

## 2020-01-12 NOTE — Progress Notes (Addendum)
SHAKEEL, DISNEY ([id number]) Visit Report for 01/12/2020 Arrival Information Details Patient Name: Date of Service: [name], [name] 01/12/2020 10:30 AM Medical Record [id number] Patient Account Number: [id number] Date of Birth/Sex: Treating RN: 1951/04/19 (69 y.o. [name] Primary Care Tomaz Janis: [name] Other Clinician: Referring Nedda Gains: Treating Litisha Guagliardo/Extender:[name], Dema Severin, [name] in Treatment: 207 Visit Information History Since Last Visit Walker Added or deleted any medications: No Patient Arrived: Any new allergies or adverse reactions: No Arrival Time: 10:42 Had a fall or experienced change in No Accompanied By: self activities of daily living that may affect Transfer Assistance: None risk of falls: Patient Identification Verified: Yes Signs or symptoms of abuse/neglect since last No Secondary Verification Process Completed: Yes visito Patient Requires Transmission-Based No Hospitalized since last visit: No Precautions: Implantable device outside of the clinic excluding No Patient Has Alerts: Yes cellular tissue based products placed in the center since last visit: Has Dressing in Place as Prescribed: Yes Has Compression in Place as Prescribed: Yes Pain Present Now: No Electronic Signature(s) Signed: 01/12/2020 5:34:34 PM By: Deon Pilling Entered By: Deon Pilling on 01/12/2020 10:42:52 -------------------------------------------------------------------------------- Compression Therapy Details Patient Name: Date of Service: [name], [name] 01/12/2020 10:30 AM Medical Record [id number] Patient Account Number: [id number] Date of Birth/Sex: Treating RN: [birth year] (69 y.o. [name] Primary Care Keiden Deskin: [name] Other Clinician: Referring Coralee Edberg: Treating Aspen Deterding/Extender:[name], Dema Severin, [name] in Treatment: 207 Compression Therapy Performed for Wound NonWound Condition Lymphedema  - Right Leg Assessment: Performed By: Clinician [name], RN Compression Type: [name] Post Procedure Diagnosis Same as Pre-procedure Electronic Signature(s) Signed: 01/12/2020 5:43:44 PM By: Baruch Gouty RN, BSN Entered By: Baruch Gouty on 01/12/2020 11:18:29 -------------------------------------------------------------------------------- Compression Therapy Details Patient Name: Date of Service: [name] 01/12/2020 10:30 AM Medical Record [id number] Patient Account Number: [id number] Date of Birth/Sex: Treating RN: 03-03-1951 (69 y.o. [name] Primary Care Melony Tenpas: [name] Other Clinician: Referring Bensyn Bornemann: Treating Kymberlie Brazeau/Extender:[name], Dema Severin, [name] in Treatment: 207 Compression Therapy Performed for Wound NonWound Condition Lymphedema - Left Leg Assessment: Performed By: Clinician [name], RN Compression Type: Four Layer Post Procedure Diagnosis Same as Pre-procedure Electronic Signature(s) Signed: 01/12/2020 5:43:44 PM By: Baruch Gouty RN, BSN Entered By: Baruch Gouty on 01/12/2020 11:18:54 -------------------------------------------------------------------------------- Lower Extremity Assessment Details Patient Name: Date of Service: [name], [name] 01/12/2020 10:30 AM Medical Record [id number] Patient Account Number: [id number] Date of Birth/Sex: Treating RN: 08-21-1951 (69 y.o. [name] Primary Care Antonio Creswell: [name] Other Clinician: Referring Seldon Barrell: Treating Kasy Iannacone/Extender:[name], Dema Severin, [name] in Treatment: 207 Edema Assessment Assessed: [Left: Yes] [Right: Yes] Edema: [Left: Yes] [Right: Yes] Calf Left: Right: Point of Measurement: 31 cm From Medial Instep 42 cm 46 cm Ankle Left: Right: Point of Measurement: 12 cm From Medial Instep 26 cm 29 cm Electronic Signature(s) Signed: 01/12/2020 5:34:34 PM By: Deon Pilling Entered By: Deon Pilling  on 01/12/2020 10:43:58 -------------------------------------------------------------------------------- Multi-Disciplinary Care Plan Details Patient Name: Date of Service: [name] 01/12/2020 10:30 AM Medical Record [id number] Patient Account Number: [id number] Date of Birth/Sex: Treating RN: 1951-02-10 (69 y.o. [name] Primary Care Nesiah Jump: [name] Other Clinician: Referring Aleksandra Raben: Treating Katleen Carraway/Extender:[name], Dema Severin, [name] in Treatment: 207 Active Inactive Venous Leg Ulcer Nursing Diagnoses: Actual venous Insuffiency (use after diagnosis is confirmed) Goals: Patient will maintain optimal edema control Date Initiated: 09/10/2016 Target Resolution Date: 01/26/2020 Goal Status: Active Verify adequate tissue perfusion prior to therapeutic compression application Date Initiated: 09/10/2016 Date  Inactivated: 11/28/2016 Goal Status: Met Interventions: Assess peripheral edema status every visit. Compression as ordered Provide education on venous insufficiency Notes: edema not contolled above wraps, pt not using lymoh pumps regularly Wound/Skin Impairment Nursing Diagnoses: Impaired tissue integrity Goals: Patient/caregiver will verbalize understanding of skin care regimen Date Initiated: 09/10/2016 Target Resolution Date: 01/26/2020 Goal Status: Active Interventions: Assess patient/caregiver ability to perform ulcer/skin care regimen upon admission and as needed Assess ulceration(s) every visit Provide education on ulcer and skin care Notes: Electronic Signature(s) Signed: 01/12/2020 5:43:44 PM By: Baruch Gouty RN, BSN Entered By: Baruch Gouty on 01/12/2020 11:17:26 -------------------------------------------------------------------------------- Pain Assessment Details Patient Name: Date of Service: [name] 01/12/2020 10:30 AM Medical Record [id number] Patient Account Number: [id number] Date of  Birth/Sex: Treating RN: 12-Jul-1951 (69 y.o. [name] Primary Care Veida Spira: [name] Other Clinician: Referring Peighton Mehra: Treating Irelyn Perfecto/Extender:[name], Dema Severin, [name] in Treatment: 207 Active Problems Location of Pain Severity and Description of Pain Patient Has Paino No Site Locations Rate the pain. Current Pain Level: 0 Pain Management and Medication Current Pain Management: Medication: No Cold Application: No Rest: No Massage: No Activity: No T.E.N.S.: No Heat Application: No Leg drop or elevation: No Is the Current Pain Management Adequate: Adequate How does your wound impact your activities of daily livingo Sleep: No Bathing: No Appetite: No Relationship With Others: No Bladder Continence: No Emotions: No Bowel Continence: No Work: No Toileting: No Drive: No Dressing: No Hobbies: No Electronic Signature(s) Signed: 01/12/2020 5:34:34 PM By: Deon Pilling Entered By: Deon Pilling on 01/12/2020 10:43:22 -------------------------------------------------------------------------------- Patient/Caregiver Education Details Patient Name: Date of Service: [name] 3/10/2021andnbsp10:30 AM Medical Record [id number] Patient Account Number: [id number] Date of Birth/Gender: Treating RN: 20-May-1951 (69 y.o. [name] Primary Care Physician: [name] Other Clinician: Referring Physician: Treating Physician/Extender:[name], Dema Severin, [name] in Treatment: 207 Education Assessment Education Provided To: Patient Education Topics Provided Venous: Methods: Explain/Verbal Responses: Reinforcements needed, State content correctly Wound/Skin Impairment: Methods: Explain/Verbal Responses: Reinforcements needed, State content correctly Electronic Signature(s) Signed: 01/12/2020 5:43:44 PM By: Baruch Gouty RN, BSN Entered By: Baruch Gouty on 01/12/2020  11:17:53 -------------------------------------------------------------------------------- Wound Assessment Details Patient Name: Date of Service: [name] 01/12/2020 10:30 AM Medical Record [id number] Patient Account Number: [id number] Date of Birth/Sex: Treating RN: 1951-10-05 (69 y.o. [name] Primary Care Xochil Shanker: [name] Other Clinician: Referring Shantoria Ellwood: Treating Won Kreuzer/Extender:[name], Dema Severin, [name] in Treatment: 207 Wound Status Wound Number: 159 Primary Lymphedema Etiology: Wound Location: Right Lower Leg - Lateral Wound Healed - Epithelialized Wounding Event: Blister Status: Date Acquired: 01/03/2020 Comorbid Chronic sinus problems/congestion, Weeks Of Treatment: 1 History: Arrhythmia, Hypertension, Peripheral Arterial Clustered Wound: No Disease, Type II Diabetes, History of Burn, Gout, Confinement Anxiety Photos Wound Measurements Length: (cm) 0 % Reduct Width: (cm) 0 % Reduct Depth: (cm) 0 Epitheli Area: (cm) 0 Volume: (cm) 0 Wound Description Classification: Full Thickness Without Exposed Support Foul Od Structures Slough/ Wound Distinct, outline attached Margin: Exudate Medium Amount: Exudate Serosanguineous Type: Exudate red, brown Color: Wound Bed Granulation Amount: Medium (34-66%) Granulation Quality: Red Fascia E Necrotic Amount: None Present (0%) Fat Laye Tendon E Muscle E Joint Ex Bone Exp or After Cleansing: No Fibrino No Exposed Structure xposed: No r (Subcutaneous Tissue) Exposed: No xposed: No xposed: No posed: No osed: No ion in Area: 100% ion in Volume: 100% alization: None Electronic Signature(s) Signed: 01/14/2020 4:47:30 PM By: Mikeal Hawthorne EMT/HBOT Signed: 01/14/2020 5:45:00 PM By: Deon Pilling Previous Signature: 01/12/2020 5:34:34 PM  Version By: Deon Pilling Entered By: Mikeal Hawthorne on 01/14/2020  14:18:02 -------------------------------------------------------------------------------- Wound Assessment Details Patient Name: Date of Service: [name], [name] 01/12/2020 10:30 AM Medical Record [id number] Patient Account Number: [id number] Date of Birth/Sex: Treating RN: 1950/11/22 (69 y.o. [name] Primary Care Edwyn Inclan: [name] Other Clinician: Referring Eadie Repetto: Treating Tajha Sammarco/Extender:[name], Dema Severin, [name] in Treatment: 207 Wound Status Wound Number: 160 Primary Diabetic Wound/Ulcer of the Lower Extremity Etiology: Wound Location: Left Toe Fourth Wound Open Wounding Event: Gradually Appeared Status: Date Acquired: 01/05/2020 Comorbid Chronic sinus problems/congestion, Weeks Of Treatment: 1 History: Arrhythmia, Hypertension, Peripheral Arterial Clustered Wound: No Disease, Type II Diabetes, History of Burn, Gout, Confinement Anxiety Photos Wound Measurements Length: (cm) 0.6 % Reduction Width: (cm) 0.3 % Reduction Depth: (cm) 0.1 Epithelializ Area: (cm) 0.141 Tunneling: Volume: (cm) 0.014 Undermining Wound Description Classification: Grade 1 Foul Odor A Wound Margin: Distinct, outline attached Slough/Fibr Exudate Amount: Small Exudate Type: Serosanguineous Exudate Color: red, brown Wound Bed Granulation Amount: Large (67-100%) Granulation Quality: Pink Fascia Expos Necrotic Amount: Small (1-33%) Fat Layer (S Necrotic Quality: Adherent Slough Tendon Expos Muscle Expos Joint Expose Bone Exposed fter Cleansing: No ino Yes Exposed Structure ed: No ubcutaneous Tissue) Exposed: No ed: No ed: No d: No : No in Area: -50% in Volume: -55.6% ation: Small (1-33%) No : No Electronic Signature(s) Signed: 01/14/2020 4:47:30 PM By: Mikeal Hawthorne EMT/HBOT Signed: 01/14/2020 5:45:00 PM By: Deon Pilling Previous Signature: 01/12/2020 5:34:34 PM Version By: Deon Pilling Entered By: Mikeal Hawthorne on 01/14/2020  14:16:58 -------------------------------------------------------------------------------- Vitals Details Patient Name: Date of Service: [name]. 01/12/2020 10:30 AM Medical Record [id number] Patient Account Number: [id number] Date of Birth/Sex: Treating RN: 29-Aug-1951 (69 y.o. [name] Primary Care Gwin Eagon: [name] Other Clinician: Referring Marceil Welp: Treating Arnesia Vincelette/Extender:[name], Dema Severin, [name] in Treatment: 207 Vital Signs Time Taken: 10:35 Temperature (F): 97.8 Height (in): 70 Pulse (bpm): 61 Weight (lbs): 380.2 Respiratory Rate (breaths/min): 20 Body Mass Index (BMI): 54.5 Blood Pressure (mmHg): 163/81 Reference Range: 80 - 120 mg / dl Electronic Signature(s) Signed: 01/12/2020 5:34:34 PM By: Deon Pilling Entered By: Deon Pilling on 01/12/2020 10:43:10

## 2020-01-13 DIAGNOSIS — R531 Weakness: Secondary | ICD-10-CM | POA: Diagnosis not present

## 2020-01-13 DIAGNOSIS — I890 Lymphedema, not elsewhere classified: Secondary | ICD-10-CM | POA: Diagnosis not present

## 2020-01-13 DIAGNOSIS — R29898 Other symptoms and signs involving the musculoskeletal system: Secondary | ICD-10-CM | POA: Diagnosis not present

## 2020-01-17 ENCOUNTER — Other Ambulatory Visit: Payer: Self-pay | Admitting: Family Medicine

## 2020-01-19 ENCOUNTER — Encounter (HOSPITAL_BASED_OUTPATIENT_CLINIC_OR_DEPARTMENT_OTHER): Payer: Medicare Other | Admitting: Physician Assistant

## 2020-01-19 ENCOUNTER — Other Ambulatory Visit: Payer: Self-pay

## 2020-01-19 DIAGNOSIS — E1140 Type 2 diabetes mellitus with diabetic neuropathy, unspecified: Secondary | ICD-10-CM | POA: Diagnosis not present

## 2020-01-19 DIAGNOSIS — I87333 Chronic venous hypertension (idiopathic) with ulcer and inflammation of bilateral lower extremity: Secondary | ICD-10-CM | POA: Diagnosis not present

## 2020-01-19 DIAGNOSIS — L03116 Cellulitis of left lower limb: Secondary | ICD-10-CM | POA: Diagnosis not present

## 2020-01-19 DIAGNOSIS — L97511 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 of skin: Secondary | ICD-10-CM | POA: Diagnosis not present

## 2020-01-19 DIAGNOSIS — I890 Lymphedema, not elsewhere classified: Secondary | ICD-10-CM | POA: Diagnosis not present

## 2020-01-19 DIAGNOSIS — I10 Essential (primary) hypertension: Secondary | ICD-10-CM | POA: Diagnosis not present

## 2020-01-19 DIAGNOSIS — L97221 Non-pressure chronic ulcer of left calf limited to breakdown of skin: Secondary | ICD-10-CM | POA: Diagnosis not present

## 2020-01-19 DIAGNOSIS — E11622 Type 2 diabetes mellitus with other skin ulcer: Secondary | ICD-10-CM | POA: Diagnosis not present

## 2020-01-19 DIAGNOSIS — E11621 Type 2 diabetes mellitus with foot ulcer: Secondary | ICD-10-CM | POA: Diagnosis not present

## 2020-01-19 DIAGNOSIS — M109 Gout, unspecified: Secondary | ICD-10-CM | POA: Diagnosis not present

## 2020-01-19 DIAGNOSIS — L97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 right calf limited to breakdown of skin: Secondary | ICD-10-CM | POA: Diagnosis not present

## 2020-01-19 NOTE — Progress Notes (Addendum)
CLEDIS, SOHN ([id number]) Visit Report for 01/19/2020 Chief Complaint Document Details Patient Name: Date of Service: [name] 01/19/2020 10:30 AM Medical Record [id number] Patient Account Number: [id number] Date of Birth/Sex: Treating RN: 04/04/1951 (69 y.o. [name] Primary Care Provider: Nicoletta Ba Other Clinician: Referring Provider: Treating Provider/Extender:[name], [name], [name] in Treatment: 208 Information Obtained from: Patient Chief Complaint patient is here for evaluation venous/lymphedema weeping Electronic Signature(s) Signed: 01/19/2020 11:46:02 AM By: Lenda Kelp PA-C Entered By: Lenda Kelp on 01/19/2020 11:46:02 -------------------------------------------------------------------------------- HPI Details Patient Name: Date of Service: [name] 01/19/2020 10:30 AM Medical Record [id number] Patient Account Number: [id number] Date of Birth/Sex: Treating RN: 1951/08/26 (69 y.o. [name] Primary Care Provider: Nicoletta Ba Other Clinician: Referring Provider: Treating Provider/Extender:[name], [name], [name] in Treatment: 208 History of Present Illness HPI Description: Referred by PCP for consultation. Patient has long standing history of BLE venous stasis, no prior ulcerations. At beginning of month, developed cellulitis and weeping. Received IM Rocephin followed by Keflex and resolved. Wears compression stocking, appr 6 months old. Not sure strength. No present drainage. 01/22/16 this is a patient who is a type II diabetic on insulin. 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 He tells me he religiously wears pressure stockings of uncertain strength. He was here with weeping edema about 8 months ago but did not have an open wound. Roughly a month ago he had a reopening on his bilateral legs. He is been using bandages and Neosporin. He does not  complain of pain. 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 Last BUN/creatinine I have is from 11/20/15 at 13 and 1.0 respectively 01/29/16; patient arrives today having tolerated the Profore wrap. He brought in his stockings and these are 18 mmHg stockings he bought from [place]. The compression here is likely inadequate. He does not complain of pain or excessive drainage she has no systemic symptoms. The wound on the right looks improved as does the one on the left although one on the left is more substantial with still tissue at risk below the actual wound area on the bilateral posterior calf 02/05/16; patient arrives with poor edema control. He states that we did put a 4 layer compression on it last week. No weight appear 5 this. 02/12/16; the area on the posterior right Has healed. The left Has a substantial wound that has necrotic surface eschar that requires a debridement with a curette. 02/16/16;the patient called or a Nurse visit secondary to increased swelling. He had been in earlier in the week with his right leg healed. He was transitioned to is on pressure stocking on the right leg with the only open wound on the left, a substantial area on the left posterior calf. Note he has a history of severe lower extremity edema, he has a history of chronic atrial fibrillation but not heart failure per my notes but I'll need to research this. He is not complaining of chest pain shortness of breath or orthopnea. The intake nurse noted blisters on the previously closed right leg 02/19/16; this is the patient's regular visit day. I see him on Friday with escalating edema new wounds on the right leg and clear signs of at least right ventricular heart failure. I increased his Lasix to 40 twice a day. He is returning currently in follow-up. States he  is noticed a decrease in that the edema 02/26/16  patient's legs have much less edema. There is nothing really open on the right leg. The left leg has improved condition of the large superficial wound on the posterior left leg 03/04/16; edema control is very much better. The patient's right leg wounds have healed. On the left leg he continues to have severe venous inflammation on the posterior aspect of the left leg. There is no tenderness and I don't think any of this is cellulitis. 03/11/16; patient's right leg is married healed and he is in his own stocking. The patient's left leg has deteriorated somewhat. There is a lot of erythema around the wound on the posterior left leg. There is also a significant rim of erythema posteriorly just above where the wrap would've ended there is a new wound in this location and a lot of tenderness. Can't rule out cellulitis in this area. 03/15/16; patient's right leg remains healed and he is in his own stocking. The patient's left leg is much better than last review. His major wound on the posterior aspect of his left Is almost fully epithelialized. He has 3 small injuries from the wraps. Really. Erythema seems a lot better on antibiotics 03/18/16; right leg remains healed and he is in his own stocking. The patient's left leg is much better. The area on the posterior aspect of the left calf is fully epithelialized. His 3 small injuries which were wrap injuries on the left are improved only one seems still open his erythema has resolved 03/25/16; patient's right leg remains healed and he is in his own stocking. There is no open area today on the left leg posterior leg is completely closed up. His wrap injuries at the superior aspect of his leg are also resolved. He looks as though he has some irritation on the dorsal ankle but this is fully epithelialized without evidence of infection. 03/28/16; we discharged this patient on Monday. Transitioned him into his own stocking. There were problems almost immediately with  uncontrolled swelling weeping edema multiple some of which have opened. He does not feel systemically unwell in particular no chest pain no shortness of breath and he does not feel 04/08/16; the edema is under better control with the Profore light wrap but he still has pitting edema. There is one large wound anteriorly 2 on the medial aspect of his left leg and 3 small areas on the superior posterior calf. Drainage is not excessive he is tolerating a Profore light well 04/15/16; put a Profore wrap on him last week. This is controlled is edema however he had a lot of pain on his left anterior foot most of his wounds are healed 04/22/16 once again the patient has denuded areas on the left anterior foot which he states are because his wrap slips up word. He saw his primary physician today is on Lasix 40 twice a day and states that he his weight is down 20 pounds over the last 3 months. 04/29/16: Much improved. left anterior foot much improved. He is now on Lasix 80 mg per day. Much improved edema control 05/06/16; I was hoping to be able to discharge him today however once again he has blisters at a low level of where the compression was placed last week mostly on his left lateral but also his left medial leg and a small area on the anterior part of the left foot. 05/09/16; apparently the patient went home after his appointment on 7/4 later in  the evening developing pain in his upper medial thigh together with subjective fever and chills although his temperature was not taken. The pain was so intense he felt he would probably have to call 911. However he then remembered that he had leftover doxycycline from a previous round of antibiotics and took these. By the next morning he felt a lot better. He called and spoke to one of our nurses and I approved doxycycline over the phone thinking that this was in relation to the wounds we had previously seen although they were definitely were not. The patient feels a  lot better old fever no chills he is still working. Blood sugars are reasonably controlled 05/13/16; patient is back in for review of his cellulitis on his anterior medial upper thigh. He is taking doxycycline this is a lot better. Culture I did of the nodular area on the dorsal aspect of his foot grew MRSA this also looks a lot better. 05/20/16; the patient is cellulitis on the medial upper thigh has resolved. All of his wound areas including the left anterior foot, areas on the medial aspect of the left calf and the lateral aspect of the calf at all resolved. He has a new blister on the left dorsal foot at the level of the fourth toe this was excised. No evidence of infection 05/27/16; patient continues to complain weeping edema. He has new blisterlike wounds on the left anterior lateral and posterior lateral calf at the top of his wrap levels. The area on his left anterior foot appears better. He is not complaining of fever, pain or pruritus in his feet. 05/30/16; the patient's blisters on his left anterior leg posterior calf all look improved. He did not increase the Lasix 100 mg as I suggested because he was going to run out of his 40 mg tablets. He is still having weeping edema of his toes 06/03/16; I renewed his Lasix at 80 mg once a day as he was about to run out when I last saw him. He is on 80 mg of Lasix now. I have asked him to cut down on the excessive amount of water he was drinking and asked him to drink according to his thirst mechanisms 06/12/2016 -- was seen 2 days ago and was supposed to wear his compression stockings at home but he is developed lymphedema and superficial blisters on the left lower extremity and hence came in for a review 06/24/16; the remaining wound is on his left anterior leg. He still has edema coming from between his toes. There is lymphedema here however his edema is generally better than when I last saw this. He has a history of atrial fibrillation but does  not have a known history of congestive heart failure nevertheless I think he probably has this at least on a diastolic basis. 07/01/16 I reviewed his echocardiogram from January 2017. This was essentially normal. He did not have LVH, EF of 55-60%. His right ventricular function was normal although he did have trivial tricuspid and pulmonic regurgitation. This is not audible on exam however. I increased his Lasix to do massive edema in his legs well above his knees I think in early July. He was also drinking an excessive amount of water at the time. 07/15/16; missed his appointment last week because of the Labor Day holiday on Monday. He could not get another appointment later in the week. Started to feel the wrap digging in superiorly so we remove the top half and the bottom  half of his wrap. He has extensive erythema and blistering superiorly in the left leg. Very tender. Very swollen. Edema in his foot with leaking edema fluid. He has not been systemically unwell 07/22/16; the area on the left leg laterally required some debridement. The medial wounds look more stable. His wrap injury wounds appear to have healed. Edema and his foot is better, weeping edema is also better. He tells me he is meeting with the supplier of the external compression pumps at work 08/05/16; the patient was on vacation last week in [facility]. His wrap is been on for an extended period of time. Also over the weekend he developed an extensive area of tender erythema across his anterior medial thigh. He took to doxycycline yesterday that he had leftover from a previous prescription. The patient complains of weeping edema coming out of his toes 08/08/16; I saw this patient on 10/2. He was tender across his anterior thigh. I put him on doxycycline. He returns today in follow-up. He does not have any open wounds on his lower leg, he still has edema weeping into his toes. 08/12/16; patient was seen back urgently today to  follow-up for his extensive left thigh cellulitis/erysipelas. He comes back with a lot less swelling and erythema pain is much better. I believe I gave him Augmentin and Cipro. His wrap was cut down as he stated a roll down his legs. He developed blistering above the level of the wrap that remained. He has 2 open blisters and 1 intact. 08/19/16; patient is been doing his primary doctor who is increased his Lasix from 40-80 once a day or 80 already has less edema. Cellulitis has remained improved in the left thigh. 2 open areas on the posterior left calf 08/26/16; he returns today having new open blisters on the anterior part of his left leg. He has his compression pumps but is not yet been shown how to use some vital representative from the supplier. 09/02/16 patient returns today with no open wounds on the left leg. Some maceration in his plantar toes 09/10/2016 -- Dr. Leanord Hawking had recently discharged him on 09/02/2016 and he has come right back with redness swelling and some open ulcers on his left lower extremity. He says this was caused by trying to apply his compression stockings and he's been unable to use this and has not been able to use his lymphedema pumps. He had some doxycycline leftover and he has started on this a few days ago. 09/16/16; there are no open wounds on his leg on the left and no evidence of cellulitis. 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 He does not complain of symptoms here. I am not clear use using his external compression pumps. 09/23/16; I have not seen this patient in 2 weeks. He canceled his appointment 10 days ago as he was going on vacation. He tells me that on Monday he noticed a large area on his posterior left leg which is been draining copiously and is reopened into a large wound. He is been using ABDs and the external part of his juxtalite, according to our nurse this was not on  properly. 10/07/16; Still a substantial area on the posterior left leg. Using silver alginate 10/14/16; in general better although there is still open area which looks healthy. Still using silver alginate. He reminds me that this happen before he left for [facility]. Today while he was showering in the morning.  He had been using his juxtalite's 10/21/16; the area on his posterior left leg is fully epithelialized. However he arrives today with a large area of tender erythema in his medial and posterior left thigh just above the knee. I have marked the area. Once again he is reluctant to consider hospitalization. I treated him with oral antibiotics in the past for a similar situation with resolution I think with doxycycline however this area it seems more extensive to me. He is not complaining of fever but does have chills and says states he is thirsty. His blood sugar today was in the 140s at home 10/25/16 the area on his posterior left leg is fully epithelialized although there is still some weeping edema. The large area of tenderness and erythema in his medial and posterior left thigh is a lot less tender although there is still a lot of swelling in this thigh. He states he feels a lot better. He is on doxycycline and Augmentin that I started last week. This will continued until Tuesday, December 26. I have ordered a duplex ultrasound of the left thigh rule out DVT whether there is an abscess something that would need to be drained I would also like to know. 11/01/16; he still has weeping edema from a not fully epithelialized area on his left posterior calf. Most of the rest of this looks a lot better. He has completed his antibiotics. His thigh is a lot better. Duplex ultrasound did not show a DVT in the thigh 11/08/16; he comes in today with more Denuded surface epithelium from the posterior aspect of his calf. There is no real evidence of cellulitis. The superior aspect of his wrap appears to  have put quite an indentation in his leg just below the knee and this may have contributed. He does not complain of pain or fever. We have been using silver alginate as the primary dressing. The area of cellulitis in the right thigh has totally resolved. He has been using his compression stockings once a week 11/15/16; the patient arrives today with more loss of epithelium from the posterior aspect of his left calf. He now has a fairly substantial wound in this area. The reason behind this deterioration isn't exactly clear although his edema is not well controlled. He states he feels he is generally more swollen systemically. He is not complaining of chest pain shortness of breath fever. Tells me he has an appointment with his primary physician in early February. He is on 80 mg of oral Lasix a day. He claims compliance with the external compression pumps. He is not having any pain in his legs similar to what he has with his recurrent cellulitis 11/22/16; the patient arrives a follow-up of his large area on his left lateral calf. This looks somewhat better today. He came in earlier in the week for a dressing change since I saw him a week ago. He is not complaining of any pain no shortness of breath no chest pain 11/28/16; the patient arrives for follow-up of his large area on the left lateral calf this does not look better. In fact it is larger weeping edema. The surface of the wound does not look too bad. We have been using silver alginate although I'm not certain that this is a dressing issue. 12/05/16; again the patient follows up for a large wound on the left lateral and left posterior calf this does not look better. There continues to be weeping edema necrotic surface tissue. More worrisome than  this once again there is erythema below the wound involving the distal Achilles and heel suggestive of cellulitis. He is on his feet working most of the day of this is not going well. We are changing his  dressing twice a week to facilitate the drainage. 12/12/16; not much change in the overall dimensions of the large area on the left posterior calf. This is very inflamed looking. I gave him an. Doxycycline last week does not really seem to have helped. He found the wrap very painful indeed it seems to of dog into his legs superiorly and perhaps around the heel. He came in early today because the drainage had soaked through his dressings. 12/19/16- patient arrives for follow-up evaluation of his left lower extremity ulcers. He states that he is using his lymphedema pumps once daily when there is "no drainage". He admits to not using his lipedema pumps while under current treatment. His blood sugars have been consistently between 150-200. 12/26/16; the patient is not using his compression pumps at home because of the wetness on his feet. I've advised him that I think it's important for him to use this daily. He finds his feet too wet, he can put a plastic bag over his legs while he is in the pumps. Otherwise I think will be in a vicious circle. We are using silver alginate to the major area on his left posterior calf 01/02/17; the patient's posterior left leg has further of all into 3 open wounds. All of them covered with a necrotic surface. He claims to be using his compression pumps once a day. His edema control is marginal. Continue with silver alginate 01/10/17; the patient's left posterior leg actually looks somewhat better. There is less edema, less erythema. Still has 3 open areas covered with a necrotic surface requiring debridement. He claims to be using his compression pumps once a day his edema control is better 01/17/17; the patient's left posterior calf look better last week when I saw him and his wrap was changed 2 days ago. He has noted increasing pain in the left heel and arrives today with much larger wounds extensive erythema extending down into the entire heel area especially tender  medially. He is not systemically unwell CBGs have been controlled no fever. Our intake nurse showed me limegreen drainage on his AVD pads. 01/24/17; his usual this patient responds nicely to antibiotics last week giving him Levaquin for presumed Pseudomonas. The whole entire posterior part of his leg is much better much less inflamed and in the case of his Achilles heel area much less tender. He has also had some epithelialization posteriorly there are still open areas here and still draining but overall considerably better 01/31/17- He has continue to tolerate the compression wraps. he states that he continues to use the lymphedema pumps daily, and can increase to twice daily on the weekends. He is voicing no complaints or concerns regarding his LLE ulcers 02/07/17-he is here for follow-up evaluation. He states that he noted some erythema to the left medial and anterior thigh, which he states is new as of yesterday. He is concerned about recurrent cellulitis. He states his blood sugars have been slightly elevated, this morning in the 180s 02/14/17; he is here for follow-up evaluation. When he was last here there was erythema superiorly from his posterior wound in his anterior thigh. He was prescribed Levaquin however a culture of the wound surface grew MRSA over the phone I changed him to doxycycline on Monday and things  seem to be a lot better. 02/24/17; patient missed his appointment on Friday therefore we changed his nurse visit into a physician visit today. Still using silver alginate on the large area of the posterior left thigh. He isn't new area on the dorsal left second toe 03/03/17; actually better today although he admits he has not used his external compression pumps in the last 2 days or so because of work responsibilities over the weekend. 03/10/17; continued improvement. External compression pumps once a day almost all of his wounds have closed on the posterior left calf. Better edema  control 03/17/17; in general improved. He still has 3 small open areas on the lateral aspect of his left leg however most of the area on the posterior part of his leg is epithelialized. He has better edema control. He has an ABD pad under his stocking on the right anterior lower leg although he did not let us look at that today. 03/24/17; patient arrives back in clinic today with no open areas however there are areas on the posterior left calf and anterior left calf that are less than 100% epithelialized. His edema is well controlled in the left lower leg. There is some pitting edema probably lymphedema in the left upper thigh. He uses compression pumps at home once per day. I tried to get him to do this twice a day although he is very reticent. 04/01/2017 -- for the last 2 days he's had significant redness, tenderness and weeping and came in for an urgent visit today. 04/07/17; patient still has 6 more days of doxycycline. He was seen by Dr. Con Memos last Wednesday for cellulitis involving the posterior aspect, lateral aspect of his Involving his heel. For the most part he is better there is less erythema and less weeping. He has been on his feet for 12 hours 2 over the weekend. Using his compression pumps once a day 04/14/17 arrives today with continued improvement. Only one area on the posterior left calf that is not fully epithelialized. He has intense bilateral venous inflammation associated with his chronic venous insufficiency disease and secondary lymphedema. We have been using silver alginate to the left posterior calf wound In passing he tells [place] today that the right leg but we have not seen in quite some time has an open area on it but he doesn't want [place] to look at this today states he will show this to [place] next week. 04/21/17; there is no open area on his left leg although he still reports some weeping edema. He showed [place] his right leg today which is the first time we've seen this leg in a  long time. He has a large area of open wound on the right leg anteriorly healthy granulation. Quite a bit of swelling in the right leg and some degree of venous inflammation. He told us about the right leg in passing last week but states that deterioration in the right leg really only happened over the weekend 04/28/17; there is no open area on the left leg although there is an irritated part on the posterior which is like a wrap injury. The wound on the right leg which was new from last week at least to [place] is a lot better. 05/05/17; still no open area on the left leg. Patient is using his new compression stocking which seems to be doing a good job of controlling the edema. He states he is using his compression pumps once per day. The right leg still has an  open wound although it is better in terms of surface area. Required debridement. A lot of pain in the posterior right Achilles marked tenderness. Usually this type of presentation this patient gives concern for an active cellulitis 05/12/17; patient arrives today with his major wound from last week on the right lateral leg somewhat better. Still requiring debridement. He was using his compression stocking on the left leg however that is reopened with superficial wounds anteriorly he did not have an open wound on this leg previously. He is still using his juxta light's once daily at night. He cannot find the time to do this in the morning as he has to be at work by 7 AM 05/19/17; right lateral leg wound looks improved. No debridement required. The concerning area is on the left posterior leg which appears to almost have a subcutaneous hemorrhagic component to it. We've been using silver alginate to all the wounds 05/26/17; the right lateral leg wound continues to look improved. However the area on the left posterior calf is a tightly adherent surface. [place] using silver alginate. Because of the weeping edema in his legs there is very little  good alternatives. 06/02/17; the patient left here last week looking quite good. Major wound on the left posterior calf and a small one on the right lateral calf. Both of these look satisfactory. He tells me that by Wednesday he had noted increased pain in the left leg and drainage. He called on Thursday and Friday to get an appointment here but we were blocked. He did not go to urgent care or his primary physician. He thinks he had a fever on Thursday but did not actually take his temperature. He has not been using his compression pumps on the left leg because of pain. I advised him to go to the emergency room today for IV antibiotics for stents of left leg cellulitis but he has refused I have asked him to take 2 days off work to keep his leg elevated and he has refused this as well. In view of this I'm going to call him and Augmentin and doxycycline. 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 However after a detailed discussion regarding the risk benefits and alternatives he refuses to get admitted to the hospital. With no other recourse I will continue him on oral antibiotics as before and hopefully he'll have his infectious disease consultation this week. 06/16/2017 -- the patient was seen today by the nurse practitioner at infectious disease Ms. Dixon. 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 He has also been advise Lotrimin cream locally. she also had wise IV antibiotics for his condition if it flares up 06/23/17; patient arrives today with drainage bilaterally although the  remaining wound on the left posterior calf after cleaning up today "highlighter yellow drainage" did not look too bad. Unfortunately he has had breakdown on the right anterior leg [previously this leg had not been open and he is using a black stocking] he went to see infectious disease and is been put on clindamycin 150 mg daily, I  did not verify the dose although I'm not familiar with using clindamycin in this dosing range, perhaps for prophylaxisoo 06/27/17; I brought this patient back today to follow-up on the wound deterioration on the right lower leg together with surrounding cellulitis. I started him on doxycycline 4 days ago. This area looks better however he comes in today with intense cellulitis on the medial part of his left thigh. This is not have a wound in this area. Extremely tender. We've been using silver alginate to the wounds on the right lower leg left lower leg with bilateral 4 layer compression he is using his external compression pumps once a day 07/04/17; patient's left medial thigh cellulitis looks better. He has not been using his compression pumps as his insert said it was contraindicated with cellulitis. His right leg continues to make improvements all the wounds are still open. We only have one remaining wound on the left posterior calf. Using silver alginate to all open areas. He is on doxycycline which I started a week ago and should be finishing I gave him Augmentin after Thursday's visit for the severe cellulitis on the left medial thigh which fortunately looks better 07/14/17; the patient's left medial thigh cellulitis has resolved. The cellulitis in his right lower calf on the right also looks better. All of his wounds are stable to improved we've been using silver alginate he has completed the antibiotics I have given him. He has clindamycin 150 mg once a day prescribed by infectious disease for prophylaxis, I've advised him to start this now. We have been using  bilateral Unna boots over silver alginate to the wound areas 07/21/17; the patient is been to see infectious disease who noted his recurrent problems with cellulitis. He was not able to tolerate prophylactic clindamycin therefore he is on amoxicillin 500 twice a day. He also had a second daily dose of Lasix added By Dr. Oneta RackMcKeown but he is not taking this. Nor is he being completely compliant with his compression pumps a especially not this week. He has 2 remaining wounds one on the right posterior lateral lower leg and one on the left posterior medial lower leg. 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 The patient has Unna boots bilaterally. 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 He has bilateral Unna boots for compression and silver alginate to his wounds. Arrives today with his legs looking as good as I have seen him in quite some time. Not surprisingly his wounds look better as well with improvement on the right lateral leg venous insufficiency wound and also the left medial leg. He is still using the compression pumps once a day 08/11/17; both legs appear to be doing better wounds on the right lateral and left medial legs look better. Skin on the right leg quite good. He is been using silver alginate as the primary dressing. I'm going to use Anasept gel calcium alginate and maintain all the secondary dressings 08/18/17; the patient continues to actually do quite well. The area on his right lateral leg is just about closed the left medial also looks better although it is still moist in this area. His edema is well controlled we have been using Anasept gel with calcium alginate and the usual secondary dressings, 4 layer compression and once daily  use of his compression  pumps "always been able to manage 09/01/17; the patient continues to do reasonably well in spite of his trip to [place]. The area on the right lateral leg is epithelialized. Left is much better but still open. He has more edema and more chronic erythema on the left leg [venous inflammation] 09/08/17; he arrives today with no open wound on the right lateral leg and decently controlled edema. Unfortunately his left leg is not nearly as in his good situation as last week.he apparently had increasing edema starting on Saturday. He edema soaked through into his foot so used a plastic bag to walk around his home. The area on the medial right leg which was his open area is about the same however he has lost surface epithelium on the left lateral which is new and he has significant pain in the Achilles area of the left foot. He is already on amoxicillin chronically for prophylaxis of cellulitis in the left leg 09/15/17; he is completed a week of doxycycline and the cellulitis in the left posterior leg and Achilles area is as usual improved. He still has a lot of edema and fluid soaking through his dressings. There is no open wound on the right leg. He saw infectious disease NP today 09/22/17;As usual 1 we transition him from our compression wraps to his stockings things did not go well. He has several small open areas on the right leg. He states this was caused by the compression wrap on his skin although he did not wear this with the stockings over them. He has several superficial areas on the left leg medially laterally posteriorly. He does not have any evidence of active cellulitis especially involving the left Achilles The patient is traveling from [facility] Saturday going to [facility]. He states he isn't attempting to get an appointment with a heel objects wound center there to change his dressings. I am not completely certain whether this will work 10/06/17; the patient came in on Friday for a  nurse visit and the nurse reported that his legs actually look quite good. He arrives in clinic today for his regular follow-up visit. He has a new wound on his left third toe over the PIP probably caused by friction with his footwear. He has small areas on the left leg and a very superficial but epithelialized area on the right anterior lateral lower leg. Other than that his legs look as good as I've seen him in quite some time. We have been using silver alginate Review of systems; no chest pain no shortness of breath other than this a 10 point review of systems negative 10/20/17; seen by Dr. Meyer Russel last week. He had taken some antibiotics [doxycycline] that he had left over. Dr. Meyer Russel thought he had candida infection and declined to give him further antibiotics. He has a small wound remaining on the right lateral leg several areas on the left leg including a larger area on the left posterior several left medial and anterior and a small wound on the left lateral. The area on the left dorsal third toe looks a lot better. ROS; Gen.; no fever, respiratory no cough no sputum Cardiac no chest pain other than this 10 point review of system is negative 10/30/17; patient arrives today having fallen in the bathtub 3 days ago. It took him a while to get up. He has pain and maceration in the wounds on his left leg which have deteriorated. He has not been using his pumps  he also has some maceration on the right lateral leg. 11/03/17; patient continues to have weeping edema especially in the left leg. This saturates his dressings which were just put on on 12/27. As usual the doxycycline seems to take care of the cellulitis on his lower leg. He is not complaining of fever, chills, or other systemic symptoms. He states his leg feels a lot better on the doxycycline I gave him empirically. He also apparently gets injections at his primary doctor's officeo Rocephin for cellulitis prophylaxis. I didn't ask him  about his compression pump compliance today I think that's probably marginal. Arrives in the clinic with all of his dressings primary and secondary macerated full of fluid and he has bilateral edema 11/10/17; the patient's right leg looks some better although there is still a cluster of wounds on the right lateral. The left leg is inflamed with almost circumferential skin loss medially to laterally although we are still maintaining anteriorly. He does not have overt cellulitis there is a lot of drainage. He is not using compression pumps. We have been using silver alginate to the wound areas, there are not a lot of options here 11/17/17; the patient's right leg continues to be stable although there is still open wounds, better than last week. The inflammation in the left leg is better. Still loss of surface layer epithelium especially posteriorly. There is no overt cellulitis in the amount of edema and his left leg is really quite good, tells me he is using his compression pumps once a day. 11/24/17; patient's right leg has a small superficial wound laterally this continues to improve. The inflammation in the left leg is still improving however we have continuous surface layer epithelial loss posteriorly. There is no overt cellulitis in the amount of edema in both legs is really quite good. He states he is using his compression pumps on the left leg once a day for 5 out of 7 days 12/01/17; very small superficial areas on the right lateral leg continue to improve. Edema control in both legs is better today. He has continued loss of surface epithelialization and left posterior calf although I think this is better. We have been using silver alginate with large number of absorptive secondary dressings 4 layer on the left Unna boot on the right at his request. He tells me he is using his compression pumps once a day 12/08/17; he has no open area on the right leg is edema control is good here. On the left leg  however he has marked erythema and tenderness breakdown of skin. He has what appears to be a wrap injury just distal to the popliteal fossa. 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 There was swelling erythema and breakdown in the right leg. He has much worse skin breakdown in the left leg as well multiple open areas medially and posteriorly as well as laterally. He tells me he has been using his compression pumps but tells me he feels that the drainage out of his leg is worse when he uses a compression pumps. To be fair to him he is been saying this for a while however I don't know that I have really been listening to this. I wonder if  the compression pumps are working properly 12/22/17;. Once again he arrives with severe erythema, weeping edema from the left greater than right leg. Noncompliance with compression pumps. New this visit he is complaining of pain on the lateral aspect of the right leg and the medial aspect of his right thigh. He apparently saw his cardiologist Dr. Debara Pickett who was ordered an echocardiogram area and I think this is a step in the right direction 12/25/17; started his doxycycline Monday night. There is still intense erythema of the right leg especially in the anterior thigh although there is less tenderness. The erythema around the wound on the right lateral calf also is less tender. He still complaining of pain in the left heel. His wounds are about the same right lateral left medial left lateral. Superficial but certainly not close to closure. He denies being systemically unwell no fever chills no abdominal pain no diarrhea 12/29/17; back in follow-up of his extensive right calf and right thigh  cellulitis. I added amoxicillin to cover possible doxycycline resistant strep. This seems to of done the trick he is in much less pain there is much less erythema and swelling. He has his echocardiogram at 11:00 this morning. X-ray of the left heel was also negative. 01/05/18; the patient arrived with his edema under much better control. Now that he is retired he is able to use his compression pumps daily and sometimes twice a day per the patient. He has a wound on the right leg the lateral wound looks better. Area on the left leg also looks a lot better. He has no evidence of cellulitis in his bilateral thighs I had a quick peak at his echocardiogram. He is in normal ejection fraction and normal left ventricular function. He has moderate pulmonary hypertension moderately reduced right ventricular function. One would have to wonder about chronic sleep apnea although he says he doesn't snore. He'll review the echocardiogram with his cardiologist. 01/12/18; the patient arrives with the edema in both legs under exemplary control. He is using his compression pumps daily and sometimes twice daily. His wound on the right lateral leg is just about closed. He still has some weeping areas on the posterior left calf and lateral left calf although everything is just about closed here as well. I have spoken with Jeri Lager who is the patient's nurse practitioner and infectious disease. She was concerned that the patient had not understood that the parenteral penicillin injections he was receiving for cellulitis prophylaxis was actually benefiting him. I don't think the patient actually saw that I would tend to agree we were certainly dealing with less infections although he had a serious one last month. 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 He is healed. He'll be placed in juxtalite compression wraps and increase his lymphedema pumps to twice daily. We will follow up  again next week to ensure there are no issues with the new regiment. 01/20/18-he is here for evaluation of bilateral lower extremity weeping edema. Yesterday he was placed in compression wrap to the right lower extremity and compression stocking to left lower shrubbery. He states he uses lymphedema pumps last night and again this morning and noted a blister to the left lower extremity. On exam he was noted to have drainage to the right lower extremity. He will be placed in Unna boots bilaterally and follow-up next week 01/26/18; patient was actually discharged a week ago to his own juxta light stockings only to return the next  day with bilateral lower extremity weeping edema.he was placed in bilateral Unna boots. He arrives today with pain in the back of his left leg. There is no open area on the right leg however there is a linear/wrap injury on the left leg and weeping edema on the left leg posteriorly. I spoke with infectious disease about 10 days ago. They were disappointed that the patient elected to discontinue prophylactic intramuscular penicillin shots as they felt it was particularly beneficial in reducing the frequency of his cellulitis. I discussed this with the patient today. He does not share this view. He'll definitely need antibiotics today. Finally he is traveling to [place] and trauma leaving this Saturday and returning a week later and he does not travel with his pumps. He is going by car 01/30/18; patient was seen 4 days ago and brought back in today for review of cellulitis in the left leg posteriorly. I put him on amoxicillin this really hasn't helped as much as I might like. He is also worried because he is traveling to [facility] trauma by car. Finally we will be rewrapping him. There is no open area on the right leg over his left leg has multiple weeping areas as usual 02/09/18; The same wrap on for 10 days. He did not pick up the last doxycycline I prescribed for him. He  apparently took 4 days worth he already had. There is nothing open on his right leg and the edema control is really quite good. He's had damage in the left leg medially and laterally especially probably related to the prolonged use of Unna boots 02/12/18; the patient arrived in clinic today for a nurse visit/wrap change. He complained of a lot of pain in the left posterior calf. He is taking doxycycline that I previously prescribed for him. Unfortunately even though he used his stockings and apparently used to compression pumps twice a day he has weeping edema coming out of the lateral part of his right leg. This is coming from the lower anterior lateral skin area. 02/16/18; the patient has finished his doxycycline and will finish the amoxicillin 2 days. The area of cellulitis in the left calf posteriorly has resolved. He is no longer having any pain. He tells me he is using his compression pumps at least once a day sometimes twice. 02/23/18; the patient finished his doxycycline and Amoxil last week. On Friday he noticed a small erythematous circle about the size of a quarter on the left lower leg just above his ankle. This rapidly expanded and he now has erythema on the lateral and posterior part of the thigh. This is bright red. Also has an area on the dorsal foot just above his toes and a tender area just below the left popliteal fossa. He came off his prophylactic penicillin injections at his own insistence one or 2 months ago. This is obviously deteriorated since then 03/02/18; patient is on doxycycline and Amoxil. Culture I did last week of the weeping area on the back of his left calf grew group B strep. I have therefore renewed the amoxicillin 500 3 times a day for a further week. He has not been systemically unwell. Still complaining of an area of discomfort right under his left popliteal fossa. There is no open wound on the right leg. He tells me that he is using his pumps twice a day on  most days 03/09/18; patient arrives in clinic today completing his amoxicillin today. The cellulitis on his left leg is better. Furthermore  he tells me that he had intramuscular penicillin shots that his primary care office today. However he also states that the wrap on his right leg fell down shortly after leaving clinic last week. He developed a large blister that was present when he came in for a nurse visit later in the week and then he developed intense discomfort around this area.He tells me he is using his compression pumps 03/16/18; the patient has completed his doxycycline. The infectious part of this/cellulitis in the left heel area left popliteal area is a lot better. He has 2 open areas on the right calf. Still areas on the left calf but this is a lot better as well. 03/24/18; the patient arrives complaining of pain in the left popliteal area again. He thinks some of this is wrap injury. He has no open area on the right leg and really no open area on the left calf either except for the popliteal area. He claims to be compliant with the compression pumps 03/31/18; I gave him doxycycline last week because of cellulitis in the left popliteal area. This is a lot better although the surface epithelium is denuded off and response to this. He arrives today with uncontrolled edema in the right calf area as well as a fingernail injury in the right lateral calf. There is only a few open areas on the left 04/06/18; I gave him amoxicillin doxycycline over the last 2 weeks that the amoxicillin should be completing currently. He is not complaining of any pain or systemic symptoms. The only open areas see has is on the right lateral lower leg paradoxically I cannot see anything on the left lower leg. He tells me he is using his compression pumps twice a day on most days. Silver alginate to the wounds that are open under 4 layer compression 04/13/18; he completed antibiotics and has no new complaints. Using  his compression pumps. Silver alginate that anything that's opened 04/20/18; he is using his compression pumps religiously. Silver alginate 4 layer compression anything that's opened. He comes in today with no open wounds on the left leg but 3 on the right including a new one posteriorly. He has 2 on the right lateral and one on the right posterior. He likes Unna boots on the right leg for reasons that aren't really clear we had the usual 4 layer compression on the left. It may be necessary to move to the 4 layer compression on the right however for now I left them in the Unna boots 04/27/18; he is using his compression pumps at least once a day. He has still the wounds on the right lateral calf. The area right posteriorly has closed. He does not have an open wound on the left under 4 layer compression however on the dorsal left foot just proximal to the toes and the left third toe 2 small open areas were identified 05/11/18; he has not uses compression pumps. The areas on the right lateral calf have coalesced into one large wound necrotic surface. On the left side he has one small wound anteriorly however the edema is now weeping out of a large part of his left leg. He says he wasn't using his pumps because of the weeping fluid. I explained to him that this is the time he needs to pump more 05/18/18; patient states he is using his compression pumps twice a day. The area on the right lateral large wound albeit superficial. On the left side he has innumerable number of  small new wounds on the left calf particularly laterally but several anteriorly and medially. All these appear to have healthy granulated base these look like the remnants of blisters however they occurred under compression. The patient arrives in clinic today with his legs somewhat better. There is certainly less edema, less multiple open areas on the left calf and the right anterior leg looks somewhat better as well superficial and a  little smaller. However he relates pain and erythema over the last 3-4 days in the thigh and I looked at this today. He has not been systemically unwell no fever no chills no change in blood sugar values 05/25/18; comes in today in a better state. The severe cellulitis on his left leg seems better with the Keflex. Not as tender. He has not been systemically unwell Hard to find an open wound on the left lower leg using his compression pumps twice a day The confluent wounds on his right lateral calf somewhat better looking. These will ultimately need debridement I didn't do this today. 06/01/18; the severe cellulitis on the left anterior thigh has resolved and he is completed his Keflex. There is no open wound on the left leg however there is a superficial excoriation at the base of the third toe dorsally. Skin on the bottom of his left foot is macerated looking. The left the wounds on the lateral right leg actually looks some better although he did require debridement of the top half of this wound area with an open curet 06/09/18 on evaluation today patient appears to be doing poorly in regard to his right lower extremity in particular this appears to likely be infected he has very thick purulent discharge along with a bright green tent to the discharge. This makes me concerned about the possibility of pseudomonas. He's also having increased discomfort at this point on evaluation. Fortunately there does not appear to be any evidence of infection spreading to the other location at this time. 06/16/18 on evaluation today patient appears to actually be doing fairly well. His ulcer has actually diminished in size quite significantly at this point which is good news. Nonetheless he still does have some evidence of infection he did see infectious disease this morning before coming here for his appointment. I did review the results of their evaluation and their note today. They did actually have him  discontinue the Cipro and initiate treatment with linezolid at this time. He is doing this for the next seven days and they recommended a follow-up in four months with them. He is the keep a log of the need for intermittent antibiotic therapy between now and when he falls back up with infectious disease. This will help them gaze what exactly they need to do to try and help them out. 06/23/18; the patient arrives today with no open wounds on the left leg and left third toe healed. He is been using his compression pumps twice a day. On the right lateral leg he still has a sizable wound but this is a lot better than last time I saw this. 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 Has been using silver alginate under 4 layer compression 06/30/18; the only open wound he has is on the right lateral leg and this looks healthy. No debridement is required. We have been using silver alginate. He does not have an open wound on the left leg. There  is apparently some drainage from the dorsal proximal third toe on the left although I see no open wound here. 07/03/18 on evaluation today patient was actually here just for a nurse visit rapid change. However when he was here on Wednesday for his rat change due to having been healed on the left and then developing blisters we initiated the wrap again knowing that he would be back today for us to reevaluate and see were at. Unfortunately he has developed some cellulitis into the proximal portion of his right lower extremity even into the region of his thigh. 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 He was placed on one as what at that point. Nonetheless he is done with that and has been tolerating it well otherwise. Doxycycline which in the past really did not seem to be effective for him. Nonetheless I think the best  option may be for us to definitely reinitiate the antibiotics for a longer period of time. 07/07/18; since I last saw this patient a week ago he has had a difficult time. At that point he did not have an open wound on his left leg. We transitioned him into juxta light stockings. He was apparently in the clinic the next day with blisters on the left lateral and left medial lower calf. He also had weeping edema fluid. He was put back into a compression wrap. He was also in the clinic on Friday with intense erythema in his right thigh. Per the patient he was started on Bactrim however that didn't work at all in terms of relieving his pain and swelling. He has taken 3 doxycycline that he had left over from last time and that seems to of helped. He has blistering on the right thigh as well. 07/14/18; the erythema on his right thigh has gotten better with doxycycline that he is finishing. 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 Presumably this cellulitis in the thigh was not related to that although I think this is a bit concerning going forward. He still has an area on the right lateral calf the blister on the right medial calf just below the knee that was discussed above. On the left 2 small open areas left medial and left lateral. Edema control is adequate. He is using his compression pumps twice a day 07/20/18; continued improvement in the condition of both legs especially the edema in his bilateral thighs. He tells me he is been losing weight through a combination of diet and exercise. He is using his compression pumps twice a day. So overall she made to the remaining wounds 07/27/2018; continued improvement in condition of both legs. His edema is well controlled. The area on the right lateral leg is just about closed he had one blisters show up on the medial left upper calf. We have him in 4 layer compression. He is going on  a 10-day trip to [place], [place] and [place]. He will be driving. He wants to wear Unna boots because of the lessening amount of constriction. He will not use compression pumps while he is away 08/05/18 on evaluation today patient actually appears to be doing decently well all things considered in regard to his bilateral lower extremities. The worst ulcer is actually only posterior aspect of his left lower extremity with a four layer compression wrap cut into his leg a couple weeks  back. He did have a trip and actually had [employer] for the trip that he is worn since he was last here. Nonetheless he feels like the [employer] actually do better for him his swelling is up a little bit but he also with his trip was not taking his Lasix on a regular set schedule like he was supposed to be. He states that obviously the reason being that he cannot drive and keep going without having to urinate too frequently which makes it difficult. He did not have his pumps with him while he was away either which I think also maybe playing a role here too. 08/13/2018; the patient only has a small open wound on the right lateral calf which is a big improvement in the last month or 2. He also has the area posteriorly just below the posterior fossa on the left which I think was a wrap injury from several weeks ago. He has no current evidence of cellulitis. He tells me he is back into his compression pumps twice a day. He also tells me that while he was at the [place] somebody stole a section of his extremitease stockings 08/20/2018; back in the clinic with a much improved state. He only has small areas on the right lateral mid calf which is just about healed. This was is more substantial area for quite a prolonged period of time. He has a small open area on the left anterior tibia. The area on the posterior calf just below the popliteal fossa is closed today. He is using his compression pumps twice a  day 08/28/2018; patient has no open wound on the right leg. He has a smattering of open areas on the calf with some weeping lymphedema. More problematically than that it looks as though his wraps of slipped down in his usual he has very angry upper area of edema just below the right medial knee and on the right lateral calf. He has no open area on his feet. The patient is traveling to [facility] next week. I will send him in an antibiotic. We will continue to wrap the right leg. We ordered extremitease stockings for him last week and I plan to transition the right leg to a stocking when he gets home which will be in 10 days time. As usual he is very reluctant to take his pumps with him when he travels 09/07/2018; patient returns from [facility]. He shows me a picture of his left leg in the mid part of his trip last week with intense fire engine erythema. The picture look bad enough I would have considered sending him to the hospital. Instead he went to the wound care center in [facility]. They did not prescribe him antibiotics but he did take some doxycycline he had leftover from a previous visit. I had given him trimethoprim sulfamethoxazole before he left this did not work according to the patient. This is resulted in some improvement fortunately. He comes back with a large wound on the left posterior calf. Smaller area on the left anterior tibia. Denuded blisters on the dorsal left foot over his toes. Does not have much in the way of wounds on the right leg although he does have a very tender area on the right posterior area just below the popliteal fossa also suggestive of infection. He promises me he is back on his pumps twice a day 09/15/2018; the intense cellulitis in his left lower calf is a lot better.  The wound area on the posterior left calf is also so better. However he has reasonably extensive wounds on the dorsal aspect of his second and third toes and the proximal  foot just at the base of the toes. There is nothing open on the right leg 09/22/2018; the patient has excellent edema control in his legs bilaterally. He is using his external compression pumps twice a day. He has no open area on the right leg and only the areas in the left foot dorsally second and third toe area on the left side. He does not have any signs of active cellulitis. 10/06/2018; the patient has good edema control bilaterally. He has no open wound on the right leg. There is a blister in the posterior aspect of his left calf that we had to deal with today. He is using his compression pumps twice a day. There is no signs of active cellulitis. We have been using silver alginate to the wound areas. He still has vulnerable areas on the base of his left first second toes dorsally He has a his extremities stockings and we are going to transition him today into the stocking on the right leg. He is cautioned that he will need to continue to use the compression pumps twice a day. If he notices uncontrolled edema in the right leg he may need to go to 3 times a day. 10/13/2018; the patient came in for a nurse check on Friday he has a large flaccid blister on the right medial calf just below the knee. We unroofed this. He has this and a new area underneath the posterior mid calf which was undoubtedly a blister as well. He also has several small areas on the right which is the area we put his extremities stocking on. 10/19/2018; the patient went to see infectious disease this morning I am not sure if that was a routine follow-up in any case the doxycycline I had given him was discontinued and started on linezolid. He has not started this. It is easy to look at his left calf and the inflammation and think this is cellulitis however he is very tender in the tissue just below the popliteal fossa and I have no doubt that there is infection going on here. He states the problem he is having is that with the  compression pumps the edema goes down and then starts walking the wrap falls down. We will see if we can adhere this. He has 1 or 2 minuscule open areas on the right still areas that are weeping on the posterior left calf, the base of his left second and third toes 10/26/18; back today in clinic with quite of skin breakdown in his left anterior leg. This may have been infection the area below the popliteal fossa seems a lot better however tremendous epithelial loss on the left anterior mid tibia area over quite inexpensive tissue. He has 2 blisters on the right side but no other open wound here. 10/29/2018; came in urgently to see [place] today and we worked him in for review. He states that the 4 layer compression on the right leg caused pain he had to cut it down to roughly his mid calf this caused swelling above the wrap and he has blisters and skin breakdown today. As a result of the pain he has not been using his pumps. Both legs are a lot more edematous and there is a lot of weeping fluid. 11/02/18; arrives in clinic with continued difficulties in  the right leg> left. Leg is swollen and painful. multiple skin blisters and new open areas especially laterally. He has not been using his pumps on the right leg. 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 He is not systemically unwell. 11/09/2018; the patient claims he is being compliant with his pumps. He is finished the doxycycline I gave him last week. Culture I did of the wound on the right lateral leg showed a few very resistant methicillin staph aureus. This was resistant to doxycycline. Nevertheless he states the pain in the leg is a lot better which makes me wonder if the cultured organism was not really what was causing the problem nevertheless this is a very dangerous organism to be culturing out of any wound. His right leg is still a lot larger than the left. He is using an [employer] boot on this area,  he blames a 4-layer compression for causing the original skin breakdown which I doubt is true however I cannot talk him out of it. We have been using silver alginate to all of these areas which were initially blisters 11/16/2018; patient is being compliant with his external compression pumps at twice a day. Miraculously he arrives in clinic today with absolutely no open wounds. He has better edema control on the left where he has been using 4 layer compression versus wound of wounds on the right and I pointed this out to him. There is no inflammation in the skin in his lower legs which is also somewhat unusual for him. There is no open wounds on the dorsal left foot. He has extremitease stockings at home and I have asked him to bring these in next week. 11/25/18 patient's lower extremity on examination today on the left appears for the most part to be wound free. He does have an open wound on the lateral aspect of the right lower extremity but this is minimal compared to what I've seen in past. He does request that we go ahead and wrap the left leg as well even though there's nothing open just so hopefully it will not reopen in short order. 1/28; patient has superficial open wounds on the right lateral calf left anterior calf and left posterior calf. His edema control is adequate. 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 We have been using silver alginate as the primary dressing. He claims compliance with his compression pumps 2/4; patient has superficial open wounds on numerous areas of his left calf and again one on the left dorsal foot. The areas on the right lateral calf have healed. The cellulitis that I gave him doxycycline for last week is also resolved this was mostly on the left anterior calf just below the tibial tuberosity. His edema looks fairly well-controlled. He tells me he went to see his primary  doctor today and had blood work ordered 2/11; once again he has several open areas on the left calf left tibial area. Most of these are small and appear to have healthy granulation. He does not have anything open on the right. The edema and control in his thighs is pretty good which is usually a good indication he has been using his pumps as requested. 2/18; he continues to have several small areas on the left calf and left tibial area. Most of these are small healthy granulation. We put him in his stocking on the right  leg last week and he arrives with a superficial open area over the right upper tibia and a fairly large area on the right lateral tibia in similar condition. His edema control actually does not look too bad, he claims to be using his compression pumps twice a day 2/25. Continued small areas on the left calf and left tibial area. New areas especially on the right are identified just below the tibial tuberosity and on the right upper tibia itself. There are also areas of weeping edema fluid even without an obvious wound. He does not have a considerable degree of lymphedema but clearly there is more edema here than his skin can handle. He states he is using the pumps twice a day. We have an Unna boot on the right and 4 layer compression on the left. 3/3; he continues to have an area on the right lateral calf and right posterior calf just below the popliteal fossa. There is a fair amount of tenderness around the wound on the popliteal fossa but I did not see any evidence of cellulitis, could just be that the wrap came down and rubbed in this area. He does not have an open area on the left leg however there is an area on the left dorsal foot at the base of the third toe We have been using silver alginate to all wound areas 3/10; he did not have an open area on his left leg last time he was here a week ago. Today he arrives with a horizontal wound just below the tibial tuberosity and an  area on the left lateral calf. He has intense erythema and tenderness in this area. The area is on the right lateral calf and right posterior calf better than last week. We have been using silver alginate as usual 3/18 - Patient returns with 3 small open areas on left calf, and 1 small open area on right calf, the skin looks ok with no significant erythema, he continues the UNA boot on right and 4 layer compression on left. The right lateral calf wound is closed , the right posterior is small area. we will continue silver alginate to the areas. 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 He is been tolerating the dressing changes without complication. Fortunately she has made excellent improvement in regard to the overall status of his wounds. Unfortunately every time we cease wrapping him he ends up reopening in causing more significant issues at that point. Again I'm unsure of the best direction to take although I think the lymphedema clinic may be appropriate for him. 02/03/19 on evaluation today patient appears to be doing well in regard to the wounds that we saw him for last week unfortunately he has a new area on the proximal portion of his right medial/posterior lower extremity where the wrap somewhat slowed down and caused swelling and a blister to rub and open. Unfortunately this is the only opening that he has on either leg at this point. 02/17/19 on evaluation today patient's bilateral lower extremities appear to be doing well. He still completely healed in regard to the left lower extremity. In regard to the right lower extremity the area where the wrap and slid down and caused the blister still seems to be slightly open although this is dramatically better than during the  last evaluation two  weeks ago. I'm very pleased with the way this stands overall. 03/03/19 on evaluation today patient appears to be doing well in regard to his right lower extremity in general although he did have a new blister open this does not appear to be showing any evidence of active infection at this time. Fortunately there's No fevers, chills, nausea, or vomiting noted at this time. Overall I feel like he is making good progress it does feel like that the right leg will we perform the [employer] seems to do with a bit better than three layer wrap on the left which slid down on him. We may switch to doing bilateral in the book wraps. 5/4; I have not seen Mr. Metter in quite some time. According to our case manager he did not have an open wound on his left leg last week. He had 1 remaining wound on the right posterior medial calf. He arrives today with multiple openings on the left leg probably were blisters and/or wrap injuries from Unna boots. I do not think the Unna boot's will provide adequate compression on the left. I am also not clear about the frequency he is using the compression pumps. 03/17/19 on evaluation today patient appears to be doing excellent in regard to his lower extremities compared to last week's evaluation apparently. He had gotten significantly worse last week which is unfortunate. The [employer] wrap on the left did not seem to do very well for him at all and in fact it didn't control his swelling significantly enough he had an additional outbreak. Subsequently we go back to the four layer compression wrap on the left. This is good news. At least in that he is doing better and the wound seem to be killing him. He still has not heard anything from the lymphedema clinic. 03/24/19 on evaluation today patient actually appears to be doing much better in regard to his bilateral lower Trinity as compared to last week when I saw him. Fortunately there's no signs of active infection at this time. He  has been tolerating the dressing changes without complication. Overall I'm extremely pleased with the progress and appearance in general. 04/07/19 on evaluation today patient appears to be doing well in regard to his bilateral lower extremities. His swelling is significantly down from where it was previous. With that being said he does have a couple blisters still open at this point but fortunately nothing that seems to be too severe and again the majority of the larger openings has healed at this time. 04/14/19 on evaluation today patient actually appears to be doing quite well in regard to his bilateral lower extremities in fact I'm not even sure there's anything significantly open at this time at any site. Nonetheless he did have some trouble with these wraps where they are somewhat irritating him secondary to the fact that he has noted that the graph wasn't too close down to the end of this foot in a little bit short as well up to his knee. Otherwise things seem to be doing quite well. 04/21/19 upon evaluation today patient's wound bed actually showed evidence of being completely healed in regard to both lower extremities which is excellent news. There does not appear to be any signs of active infection which is also good news. I'm very pleased in this regard. No fevers, chills, nausea, or vomiting noted at this time. 04/28/19 on evaluation today patient appears to be doing a little bit worse in regard  to both lower extremities on the left mainly due to the fact that when he went infection disease the wrap was not wrapped quite high enough he developed a blister above this. On the right he is a small open area of nothing too significant but again this is continuing to give him some trouble he has been were in the Velcro compression that he has at home. 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 He's been tolerating the dressing changes  without complication. Fortunately there's no signs of active infection at this time. No fevers, chills, nausea, or vomiting noted at this time. We have been trying to get an appointment with her lymphedema clinic in [facility] but unfortunately nobody can get them on phone with not been able to even fax information over the patient likewise is not been able to get in touch with them. Overall I'm not sure exactly what's going on here with to reach out again today. 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 Still having a lot of drainage unfortunately. He tells me especially in the left but even on the right. There's no signs of active infection which is good news we've been using so ratcheted up to this point. 05/19/19 on evaluation today patient actually appears to be doing quite well with regard to his left lower extremity which is great news. Fortunately in regard to the right lower extremity has an issues with his wrap and he subsequently did remove this from what I'm understanding. Nonetheless long story short is what he had rewrapped once he removed it subsequently had maggots underneath this wrap whenever he came in for evaluation today. With that being said they were obviously completely cleaned away by the nursing staff. The visit today which is excellent news. However he does appear to potentially have some infection around the right ankle region where the maggots were located as well. He will likely require anabiotic therapy today. 05/26/19 on evaluation today patient actually appears to be doing much better in regard to his bilateral lower extremities. I feel like the infection is under much better control. With that being said there were maggots noted when the wrap was removed yet again today. Again this could have potentially been left over from previous although at this time there does  not appear to be any signs of significant drainage there was obviously on the wrap some drainage as well this contracted gnats or otherwise. Either way I do not see anything that appears to be doing worse in my pinion and in fact I think his drainage has slowed down quite significantly likely mainly due to the fact to his infection being under better control. 06/02/2019 on evaluation today patient actually appears to be doing well with regard to his bilateral lower extremities there is no signs of active infection at this time which is great news. With that being said he does have several open areas more so on the right than the left but nonetheless these are all significantly better than previously noted. 06/09/2019 on evaluation today patient actually appears to be doing well. His wrap stayed up and he did not cause any problems he had more drainage on the right compared to the left but overall I do not see any major issues at this time which is great news. 06/16/2019 on evaluation today patient appears to be  doing excellent with regard to his lower extremities the only area that is open is a new blister that can have opened as of today on the medial ankle on the left. Other than this he really seems to be doing great I see no major issues at this point. 06/23/2019 on evaluation today patient appears to be doing quite well with regard to his bilateral lower extremities. In fact he actually appears to be almost completely healed there is a small area of weeping noted of the right lower extremity just above the ankle. Nonetheless fortunately there is no signs of active infection at this time which is good news. No fevers, chills, nausea, vomiting, or diarrhea. 8/24; the patient arrived for a nurse visit today but complained of very significant pain in the left leg and therefore I was asked to look at this. Noted that he did not have an open area on the left leg last week nevertheless this was wrapped.  The patient states that he is not been able to put his compression pumps on the left leg because of the discomfort. He has not been systemically unwell 06/30/2019 on evaluation today patient unfortunately despite being excellent last week is doing much worse with regard to his left lower extremity today. In fact he had to come in for a nurse on Monday where his left leg had to be rewrapped due to excessive weeping Dr. Leanord Hawking placed him on doxycycline at that point. Fortunately there is no signs of active infection Systemically at this time which is good news. 07/07/2019 in regard to the patient's wounds today he actually seems to be doing well with his right lower extremity there really is nothing open or draining at this point this is great news. Unfortunately the left lower extremity is given him additional trouble at this time. There does not appear to be any signs of active infection nonetheless he does have a lot of edema and swelling noted at this point as well as blistering all of which has led to a much more poor appearing leg at this time compared to where it was 2 weeks ago when it was almost completely healed. Obviously this is a little discouraging for the patient. He is try to contact the lymphedema clinic in [place] he has not been able to get through to them. 07/14/2019 on evaluation today patient actually appears to be doing slightly better with regard to his left lower extremity ulcers. Overall I do feel like at least at the top of the wrap that we have been placing this area has healed quite nicely and looks much better. The remainder of the leg is showing signs of improvement. Unfortunately in the thigh area he still has an open region on the left and again on the right he has been utilizing just a Band-Aid on an area that also opened on the thigh. Again this is an area that were not able to wrap although we did do an Ace wrap to provide some compression that something that  obviously is a little less effective than the compression wraps we have been using on the lower portion of the leg. He does have an appointment with the lymphedema clinic in [facility] on Friday. 07/21/2019 on evaluation today patient appears to be doing better with regard to his lower extremity ulcers. He has been tolerating the dressing changes without complication. Fortunately there is no signs of active infection at this time. No fevers, chills, nausea, vomiting, or diarrhea. I did receive  the paperwork from the physical therapist at the lymphedema clinic in [place]. Subsequently I signed off on that this morning and sent that back to him for further progression with the treatment plan. 07/28/2019 on evaluation today patient appears to be doing very well with regard to his right lower extremity where I do not see any open wounds at this point. Fortunately he is feeling great as far as that is concerned as well. In regard to the left lower extremity he has been having issues with still several areas of weeping and edema although the upper leg is doing better his lower leg still I think is going require the compression wrap at this time. No fevers, chills, nausea, vomiting, or diarrhea. 08/04/2019 on evaluation today patient unfortunately is having new wounds on the right lower extremity. Again we have been using Unna boot wrap on that side. We switched him to using his juxta lite wrap at home. With that being said he tells me he has been using it although his legs extremely swollen and to be honest really does not appear that he has been. I cannot know that for sure however. Nonetheless he has multiple new wounds on the right lower extremity at this time. Obviously we will have to see about getting this rewrapped for him today. 08/11/2019 on evaluation today patient appears to be doing fairly well with regard to his wounds. He has been tolerating the dressing changes including the  compression wraps without complication. He still has a lot of edema in his upper thigh regions bilaterally he is supposed to be seeing the lymphedema clinic on the 15th of this month once his wraps arrive for the upper part of his legs. 08/18/2019 on evaluation today patient appears to be doing well with regard to his bilateral lower extremities at this point. He has been tolerating the dressing changes without complication. Fortunately there is no signs of active infection which is also good news. He does have a couple weeping areas on the first and second toe of the right foot he also has just a small area on the left foot upper leg and a small area on the left lower leg but overall he is doing quite well in my opinion. He is supposed to be getting his wraps shortly in fact tomorrow and then subsequently is seeing the lymphedema clinic next Wednesday on the 21st. Of note he is also leaving on the 25th to go on vacation for a week to the beach. For that reason and since there is some uncertainty about what there can be doing at lymphedema clinic next Wednesday I am get a make an appointment for next Friday here for [place] to see what we need to do for him prior to him leaving for vacation. 10/23; patient arrives in considerable pain predominantly in the upper posterior calf just distal to the popliteal fossa also in the wound anteriorly above the major wound. This is probably cellulitis and he has had this recurrently in the past. He has no open wound on the right side and he has had an Radio broadcast assistant in that area. Finally I note that he has an area on the left posterior calf which by enlarge is mostly epithelialized. This protrudes beyond the borders of the surrounding skin in the setting of dry scaly skin and lymphedema. The patient is leaving for [facility] on Sunday. Per his longstanding pattern, he will not take his compression pumps with him predominantly out of fear that they  will be stolen. He  therefore asked that we put a Unna boot back on the right leg. He will also contact the wound care center in [facility] to see if they can change his dressing in the mid week. 11/3; patient returned from his vacation to [facility]. He was seen on 1 occasion at their wound care center. They did a 2 layer compression system as they did not have our 4-layer wrap. I am not completely certain what they put on the wounds. They did not change the Unna boot on the right. The patient is also seeing a lymphedema specialist physical therapist in [place]. It appears that he has some compression sleeve for his thighs which indeed look quite a bit better than I am used to seeing. He pumps over these with his external compression pumps. 11/10; the patient has a new wound on the right medial thigh otherwise there is no open areas on the right. He has an area on the left leg posteriorly anteriorly and medially and an area over the left second toe. We have been using silver alginate. He thinks the injury on his thigh is secondary to friction from the compression sleeve he has. 11/17; the patient has a new wound on the right medial thigh last week. He thinks this is because he did not have a underlying stocking for his thigh juxta lite apparatus. He now has this. The area is fairly large and somewhat angry but I do not think he has underlying cellulitis. He has a intact blister on the right anterior tibial area. Small wound on the right great toe dorsally Small area on the medial left calf. 11/30; the patient does not have any open areas on his right leg and we did not take his juxta lite stocking off. However he states that on Friday his compression wrap fell down lodging around his upper mid calf area. As usual this creates a lot of problems for him. He called urgently today to be seen for a nurse visit however the nurse visit turned into a provider visit because of extreme erythema and pain in the  left anterior tibia extending laterally and posteriorly. The area that is problematic is extensive 10/06/2019 upon evaluation today patient actually appears to be doing poorly in regard to his left lower extremity. He Dr. Leanord Hawking did place him on doxycycline this past Monday apparently due to the fact that he was doing much worse in regard to this left leg. Fortunately the doxycycline does seem to be helping. Unfortunately we are still having a very difficult time getting his edema under any type of control in order to anticipate discharge at some point. The only way were really able to control his lymphedema really is with compression wraps and that has only even seemingly temporary. He has been seeing a lymphedema clinic they are trying to help in this regard but still this has been somewhat frustrating in general for the patient. 10/13/19 on evaluation today patient appears to be doing excellent with regard to his right lower extremity as far as the wounds are concerned. His swelling is still quite extensive unfortunately. He is still having a lot of drainage from the thigh areas bilaterally which is unfortunate. He's been going to lymphedema clinic but again he still really does not have this edema under control as far as his lower extremities are concern. With regard to his left lower extremity this seems to be improving and I do believe the doxycycline has been  of benefit for him. He is about to complete the doxycycline. 10/20/2019 on evaluation today patient appears to be doing poorly in regard to his bilateral lower extremities. More in the right thigh he has a lot of irritation at this site unfortunately. In regard to the left lower extremity the wrap was not quite as high it appears and does seem to have caused him some trouble as well. Fortunately there is no evidence of systemic infection though he does have some blue-green drainage which has me concerned for the possibility of  Pseudomonas. He tells me he is previously taking Cipro without complications and he really does not care for Levaquin however due to some of the side effects he has. He is not allergic to any medications specifically antibiotics that were aware of. 10/27/2019 on evaluation today patient actually does appear to be for the most part doing better when compared to last week's evaluation. With that being said he still has multiple open wounds over the bilateral lower extremities. He actually forgot to start taking the Cipro and states that he still has the whole bottle. He does have several new blisters on left lower extremity today I think I would recommend he go ahead and take the Cipro based on what I am seeing at this point. 12/30-Patient comes at 1 week visit, 4 layer compression wraps on the left and Unna boot on the right, primary dressing Xtrasorb and silver alginate. Patient is taking his Cipro and has a few more days left probably 5-6, and the legs are doing better. He states he is using his compressions devices which I believe he has 11/10/2019 on evaluation today patient actually appears to be much better than last time I saw him 2 weeks ago. His wounds are significantly improved and overall I am very pleased in this regard. Fortunately there is no signs of active infection at this time. He is just a couple of days away from completing Cipro. Overall his edema is much better he has been using his lymphedema pumps which I think is also helping at this point. 11/17/2019 on evaluation today patient appears to be doing excellent in regard to his wounds in general. His legs are swollen but not nearly as much as they have been in the past. Fortunately he is tolerating the compression wraps without complication. No fevers, chills, nausea, vomiting, or diarrhea. He does have some erythema however in the distal portion of his right lower extremity specifically around the forefoot and toes there is a  little bit of warmth here as well. 11/24/2019 on evaluation today patient appears to be doing well with regard to his right lower extremity I really do not see any open wounds at this point. His left lower extremity does have several open areas and his right medial thigh also is open. Other than this however overall the patient seems to be making good progress and I am very pleased at this point. 12/01/2019 on evaluation today patient appears to be doing poorly at this point in regard to his left lower extremity has several new blisters despite the fact that we have him in compression wraps. In fact he had a 4-layer compression wrap, his upper thigh wrapped from lymphedema clinic, and a juxta light over top of the 4 layer compression wrap the lymphedema clinic applied and despite all this he still develop blisters underneath. Obviously this does have me concerned about the fact that unfortunately despite what we are doing to try to get wounds  healed he continues to have new areas arise I do not think he is ever good to be at the point where he can realistically just use wraps at home to keep things under control. Typically when we heal him it takes about 1-2 days before he is back in the clinic with severe breakdown and blistering of his lower extremities bilaterally. This is happened numerous times in the past. Unfortunately I think that we may need some help as far as overall fluid overload to kind of limit what we are seeing and get things under better control. 12/08/2019 on evaluation today patient presents for follow-up concerning his ongoing bilateral lower extremity edema. Unfortunately he is still having quite a bit of swelling the compression wraps are controlling this to some degree but he did see Dr. [name] his cardiologist I do have that available for review today as far as the appointment was concerned that was on 12/06/2019. Obviously that she has been 2 days ago. The patient states that he  is only been taking the Lasix 80 mg 1 time a day he had told me previously he was taking this twice a day. Nonetheless Dr. [name] recommended this be up to 80 mg 2 times a day for the patient as he did appear to be fluid overloaded. With that being said the patient states he did this yesterday and he was unable to go anywhere or do anything due to the fact that he was constantly having to urinate. Nonetheless I think that this is still good to be something that is important for him as far as trying to get his edema under control at all things that he is going to be able to just expect his wounds to get under control and things to be better without going through at least a period of time where he is trying to stabilize his fluid management in general and I think increasing the Lasix is likely the first step here. It was also mentioned the possibility that the patient may require metolazone. With that being said he wanted to have the patient take Lasix twice a day first and then reevaluating 2 months to see where things stand. 12/15/2019 upon evaluation today patient appears to be doing regard to his legs although his toes are showing some signs of weeping especially on the left at this point to some degree on the right. There does not appear to be any signs of active infection and overall I do feel like the compression wraps are doing well for him but he has not been able to take the Lasix at home and the increased dose that Dr. [name] recommended. He tells me that just not go to be feasible for him. Nonetheless I think in this case he should probably send a message to Dr. [name] in order to discuss options from the standpoint of possible admission to get the fluid off or otherwise going forward. 12/22/2019 upon evaluation today patient appears to be doing fairly well with regard to his lower extremities at this point. In fact he would be doing excellent if it was not for the fact that his right anterior  thigh apparently had an allergic reaction to adhesive tape that he used. The wound itself that we have been monitoring actually appears to be healed. There is a lot of irritation at this point. 12/29/2019 upon evaluation today patient appears to be doing well in regard to his lower extremities. His left medial thigh is open and somewhat draining  today but this is the only region that is open the right has done much better with the treatment utilizing the steroid cream that I prescribed for him last week. Overall I am pleased in that regard. Fortunately there is no signs of active infection at this time. No fevers, chills, nausea, vomiting, or diarrhea. 01/05/2020 upon evaluation today patient appears to be doing more poorly in regard to his right lower extremity at this point upon evaluation today. Unfortunately he continues to have issues in this regard and I think the biggest issue is controlling his edema. This obviously is not very well controlled at this point is been recommended that he use the Lasix twice a day but he has not been able to do that. Unfortunately I think this is leading to an issue where honestly he is not really able to effectively control his edema and therefore the wounds really are not doing significantly better. I do not think that he is going to be able to keep things under good control unless he is able to control his edema much better. I discussed this again in great detail with him today. 01/12/2020 good news is patient actually appears to be doing quite well today at this point. He does have an appointment with lymphedema clinic tomorrow. His legs appear healed and the toe on the left is almost completely healed. In general I am very pleased with how things stand at this point. 01/19/2020 upon evaluation today patient appears to actually be doing well in regard to his lower extremities there is nothing open at this point. Fortunately he has done extremely well more  recently. Has been seeing lymphedema clinic as well. With that being said he has Velcro wraps for his lower legs as well as his upper legs. The only wound really is on his toe which is the right great toe and this is barely anything even there. With all that being said I think it is good to be appropriate today to go ahead and switch him over to the Velcro compression wraps. Electronic Signature(s) Signed: 01/19/2020 11:56:41 AM By: Lenda Kelp PA-C Entered By: Lenda Kelp on 01/19/2020 11:56:41 -------------------------------------------------------------------------------- Physical Exam Details Patient Name: Date of Service: VEGAS, FRITZE 01/19/2020 10:30 AM Medical Record [id number] Patient Account Number: [id number] Date of Birth/Sex: Treating RN: Apr 04, 1951 (69 y.o. [name] Primary Care Provider: Nicoletta Ba Other Clinician: Referring Provider: Treating Provider/Extender:[name], [name], [name] in Treatment: 208 Constitutional Obese and well-hydrated in no acute distress. Respiratory normal breathing without difficulty. Psychiatric this patient is able to make decisions and demonstrates good insight into disease process. Alert and Oriented x 3. pleasant and cooperative. Notes Patient's wound bed currently showed signs of good epithelization at this time in regard to all wound locations. Overall I think he is doing well at this point. With that being said he is getting need to transition to the Velcro wraps exclusively at this time since he is completely healed and we will plan to see him in 1 week just to make sure everything is still doing okay. Electronic Signature(s) Signed: 01/19/2020 11:57:12 AM By: Lenda Kelp PA-C Entered By: Lenda Kelp on 01/19/2020 11:57:12 -------------------------------------------------------------------------------- Physician Orders Details Patient Name: Date of Service: FARLEY, CROOKER  01/19/2020 10:30 AM Medical Record [id number] Patient Account Number: [id number] Date of Birth/Sex: Treating RN: Mar 28, 1951 (69 y.o. 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 Dressing Change Frequency Wound #161 Right Toe Great Change dressing three times week. Skin Barriers/Peri-Wound Care Moisturizing lotion - both legs nightly Wound Cleansing May shower and wash wound with soap and water. Primary Wound Dressing Wound #161 Right Toe Great Calcium Alginate with Silver Secondary Dressing Wound #161 Right Toe Great Kerlix/Rolled Gauze Dry Gauze Edema Control Avoid standing for long periods of time Elevate legs to the level of the heart or above for 30 minutes daily and/or when sitting, a frequency of: - throughout the day Exercise regularly Support Garment 20-30 mm/Hg pressure to: - juxtalite compression garment both legs daily Segmental Compressive Device. - [facility] pumps 60 min 2 times per day Other: - [facility] wraps to upper leg per [facility] Electronic Signature(s) Signed: 01/19/2020 1:07:46 PM By: Lenda Kelp PA-C Signed: 01/19/2020 4:42:28 PM By: Zenaida Deed RN, BSN Entered By: Zenaida Deed on 01/19/2020  11:53:10 -------------------------------------------------------------------------------- Problem List Details Patient Name: Date of Service: [name], [name] 01/19/2020 10:30 AM Medical Record [id number] Patient Account Number: [id number] Date of Birth/Sex: Treating RN: 01-08-1951 (69 y.o. 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 hypertension (idiopathic) with ulcer 01/22/2016 No Yes and inflammation of bilateral lower extremity I89.0 Lymphedema, not elsewhere classified 01/22/2016 No Yes E11.622 Type 2 diabetes mellitus with other skin ulcer 01/22/2016 No Yes 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, 01/22/2016 No Yes unspecified L03.116 Cellulitis of left lower limb 04/01/2017 No Yes Inactive Problems ICD-10 Code Description Active Date Inactive Date 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 06/30/2017 skin L97.521 Non-pressure chronic ulcer of other part of left foot limited to 04/27/2018 04/27/2018 breakdown of skin L03.115 Cellulitis of right lower limb 12/22/2017 12/22/2017 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 with other specified 06/30/2018 06/30/2018 severity L97.511 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 06/30/2018 06/30/2018 breakdown of skin Resolved Problems Electronic Signature(s) Signed: 01/19/2020 11:45:52 AM By: Lenda Kelp PA-C Entered By: Lenda Kelp on 01/19/2020  11:45:51 -------------------------------------------------------------------------------- Progress Note Details Patient Name: Date of Service: [name] 01/19/2020 10:30 AM Medical Record [id number] Patient Account Number: [id number] Date of Birth/Sex: Treating RN: 1950-12-22 (69 y.o. [name] Primary Care Provider: Nicoletta Ba Other Clinician: Referring Provider: Treating Provider/Extender:[name], [name], [name] in Treatment: 208 Subjective Chief Complaint Information obtained from Patient patient is here for evaluation venous/lymphedema weeping History of Present Illness (HPI) Referred by PCP for consultation. Patient has long standing history of BLE venous stasis, no prior ulcerations. At beginning of month, developed cellulitis and weeping. Received IM Rocephin followed by Keflex and resolved. Wears compression stocking, appr 6  months old. Not sure strength. No present drainage. 01/22/16 this is a patient who is a type II diabetic on insulin. 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 He tells me he religiously wears pressure stockings of uncertain strength. He was here with weeping edema about 8 months ago but did not have an open wound. Roughly a month ago he had a reopening on his bilateral legs. He is been using bandages and Neosporin. He does not complain of pain. 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 Last BUN/creatinine I have is from 11/20/15 at 13 and 1.0 respectively 01/29/16; patient arrives today having tolerated the Profore wrap. He brought in his stockings and these are 18 mmHg stockings he bought from [place]. The compression here is likely inadequate. He does not complain of pain or excessive drainage she has no systemic symptoms. The wound on the right looks improved as does the one on  the left although one on the left is more substantial with still tissue at risk below the actual wound area on the bilateral posterior calf 02/05/16; patient arrives with poor edema control. He states that we did put a 4 layer compression on it last week. No weight appear 5 this. 02/12/16; the area on the posterior right Has healed. The left Has a substantial wound that has necrotic surface eschar that requires a debridement with a curette. 02/16/16;the patient called or a Nurse visit secondary to increased swelling. He had been in earlier in the week with his right leg healed. He was transitioned to is on pressure stocking on the right leg with the only open wound on the left, a substantial area on the left posterior calf. Note he has a history of severe lower extremity edema, he has a history of chronic atrial fibrillation but not heart failure per my notes but I'll need to research this. He is not complaining of chest pain shortness of breath or orthopnea. The intake nurse noted blisters on the previously closed right leg 02/19/16; this is the patient's regular visit day. I see him on Friday with escalating edema new wounds on the right leg and clear signs of at least right ventricular heart failure. I increased his Lasix to 40 twice a day. He is returning currently in follow-up. States he is noticed a decrease in that the edema 02/26/16 patient's legs have much less edema. There is nothing really open on the right leg. The left leg has improved condition of the large superficial wound on the posterior left leg 03/04/16; edema control is very much better. The patient's right leg wounds have healed. On the left leg he continues to have severe venous inflammation on the posterior aspect of the left leg. There is no tenderness and I don't think any of this is cellulitis. 03/11/16; patient's right leg is married healed and he is in his own stocking. The patient's left leg has deteriorated somewhat. There  is a lot of erythema around the wound on the posterior left leg. There is also a significant rim of erythema posteriorly just above where the wrap would've ended there is a new wound in this location and a lot of tenderness. Can't rule out cellulitis in this area. 03/15/16; patient's right leg remains healed and he is in his own stocking. The patient's left leg is much better than last review. His major wound on the posterior  aspect of his left Is almost fully epithelialized. He has 3 small injuries from the wraps. Really. Erythema seems a lot better on antibiotics 03/18/16; right leg remains healed and he is in his own stocking. The patient's left leg is much better. The area on the posterior aspect of the left calf is fully epithelialized. His 3 small injuries which were wrap injuries on the left are improved only one seems still open his erythema has resolved 03/25/16; patient's right leg remains healed and he is in his own stocking. There is no open area today on the left leg posterior leg is completely closed up. His wrap injuries at the superior aspect of his leg are also resolved. He looks as though he has some irritation on the dorsal ankle but this is fully epithelialized without evidence of infection. 03/28/16; we discharged this patient on Monday. Transitioned him into his own stocking. There were problems almost immediately with uncontrolled swelling weeping edema multiple some of which have opened. He does not feel systemically unwell in particular no chest pain no shortness of breath and he does not feel 04/08/16; the edema is under better control with the Profore light wrap but he still has pitting edema. There is one large wound anteriorly 2 on the medial aspect of his left leg and 3 small areas on the superior posterior calf. Drainage is not excessive he is tolerating a Profore light well 04/15/16; put a Profore wrap on him last week. This is controlled is edema however he had a lot of  pain on his left anterior foot most of his wounds are healed 04/22/16 once again the patient has denuded areas on the left anterior foot which he states are because his wrap slips up word. He saw his primary physician today is on Lasix 40 twice a day and states that he his weight is down 20 pounds over the last 3 months. 04/29/16: Much improved. left anterior foot much improved. He is now on Lasix 80 mg per day. Much improved edema control 05/06/16; I was hoping to be able to discharge him today however once again he has blisters at a low level of where the compression was placed last week mostly on his left lateral but also his left medial leg and a small area on the anterior part of the left foot. 05/09/16; apparently the patient went home after his appointment on 7/4 later in the evening developing pain in his upper medial thigh together with subjective fever and chills although his temperature was not taken. The pain was so intense he felt he would probably have to call 911. However he then remembered that he had leftover doxycycline from a previous round of antibiotics and took these. By the next morning he felt a lot better. He called and spoke to one of our nurses and I approved doxycycline over the phone thinking that this was in relation to the wounds we had previously seen although they were definitely were not. The patient feels a lot better old fever no chills he is still working. Blood sugars are reasonably controlled 05/13/16; patient is back in for review of his cellulitis on his anterior medial upper thigh. He is taking doxycycline this is a lot better. Culture I did of the nodular area on the dorsal aspect of his foot grew MRSA this also looks a lot better. 05/20/16; the patient is cellulitis on the medial upper thigh has resolved. All of his wound areas including the left anterior foot, areas on  the medial aspect of the left calf and the lateral aspect of the calf at all resolved. He  has a new blister on the left dorsal foot at the level of the fourth toe this was excised. No evidence of infection 05/27/16; patient continues to complain weeping edema. He has new blisterlike wounds on the left anterior lateral and posterior lateral calf at the top of his wrap levels. The area on his left anterior foot appears better. He is not complaining of fever, pain or pruritus in his feet. 05/30/16; the patient's blisters on his left anterior leg posterior calf all look improved. He did not increase the Lasix 100 mg as I suggested because he was going to run out of his 40 mg tablets. He is still having weeping edema of his toes 06/03/16; I renewed his Lasix at 80 mg once a day as he was about to run out when I last saw him. He is on 80 mg of Lasix now. I have asked him to cut down on the excessive amount of water he was drinking and asked him to drink according to his thirst mechanisms 06/12/2016 -- was seen 2 days ago and was supposed to wear his compression stockings at home but he is developed lymphedema and superficial blisters on the left lower extremity and hence came in for a review 06/24/16; the remaining wound is on his left anterior leg. He still has edema coming from between his toes. There is lymphedema here however his edema is generally better than when I last saw this. He has a history of atrial fibrillation but does not have a known history of congestive heart failure nevertheless I think he probably has this at least on a diastolic basis. 07/01/16 I reviewed his echocardiogram from January 2017. This was essentially normal. He did not have LVH, EF of 55-60%. His right ventricular function was normal although he did have trivial tricuspid and pulmonic regurgitation. This is not audible on exam however. I increased his Lasix to do massive edema in his legs well above his knees I think in early July. He was also drinking an excessive amount of water at the time. 07/15/16; missed  his appointment last week because of the Labor Day holiday on Monday. He could not get another appointment later in the week. Started to feel the wrap digging in superiorly so we remove the top half and the bottom half of his wrap. He has extensive erythema and blistering superiorly in the left leg. Very tender. Very swollen. Edema in his foot with leaking edema fluid. He has not been systemically unwell 07/22/16; the area on the left leg laterally required some debridement. The medial wounds look more stable. His wrap injury wounds appear to have healed. Edema and his foot is better, weeping edema is also better. He tells me he is meeting with the supplier of the external compression pumps at work 08/05/16; the patient was on vacation last week in [facility]. His wrap is been on for an extended period of time. Also over the weekend he developed an extensive area of tender erythema across his anterior medial thigh. He took to doxycycline yesterday that he had leftover from a previous prescription. The patient complains of weeping edema coming out of his toes 08/08/16; I saw this patient on 10/2. He was tender across his anterior thigh. I put him on doxycycline. He returns today in follow-up. He does not have any open wounds on his lower leg, he still has edema  weeping into his toes. 08/12/16; patient was seen back urgently today to follow-up for his extensive left thigh cellulitis/erysipelas. He comes back with a lot less swelling and erythema pain is much better. I believe I gave him Augmentin and Cipro. His wrap was cut down as he stated a roll down his legs. He developed blistering above the level of the wrap that remained. He has 2 open blisters and 1 intact. 08/19/16; patient is been doing his primary doctor who is increased his Lasix from 40-80 once a day or 80 already has less edema. Cellulitis has remained improved in the left thigh. 2 open areas on the posterior left calf 08/26/16; he  returns today having new open blisters on the anterior part of his left leg. He has his compression pumps but is not yet been shown how to use some vital representative from the supplier. 09/02/16 patient returns today with no open wounds on the left leg. Some maceration in his plantar toes 09/10/2016 -- Dr. Leanord Hawking had recently discharged him on 09/02/2016 and he has come right back with redness swelling and some open ulcers on his left lower extremity. He says this was caused by trying to apply his compression stockings and he's been unable to use this and has not been able to use his lymphedema pumps. He had some doxycycline leftover and he has started on this a few days ago. 09/16/16; there are no open wounds on his leg on the left and no evidence of cellulitis. 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 He does not complain of symptoms here. I am not clear use using his external compression pumps. 09/23/16; I have not seen this patient in 2 weeks. He canceled his appointment 10 days ago as he was going on vacation. He tells me that on Monday he noticed a large area on his posterior left leg which is been draining copiously and is reopened into a large wound. He is been using ABDs and the external part of his juxtalite, according to our nurse this was not on properly. 10/07/16; Still a substantial area on the posterior left leg. Using silver alginate 10/14/16; in general better although there is still open area which looks healthy. Still using silver alginate. He reminds me that this happen before he left for [facility] Today while he was showering in the morning. He had been using his juxtalite's 10/21/16; the area on his posterior left leg is fully epithelialized. However he arrives today with a large area of tender erythema in his medial and posterior left thigh just above the knee. I have marked the area. Once again he is reluctant  to consider hospitalization. I treated him with oral antibiotics in the past for a similar situation with resolution I think with doxycycline however this area it seems more extensive to me. He is not complaining of fever but does have chills and says states he is thirsty. His blood sugar today was in the 140s at home 10/25/16 the area on his posterior left leg is fully epithelialized although there is still some weeping edema. The large area of tenderness and erythema in his medial and posterior left thigh is a lot less tender although there is still a lot of swelling in this thigh. He states he feels a lot better. He is on doxycycline and Augmentin that I started last week. This will continued until Tuesday, December 26. I have ordered a duplex ultrasound of the  left thigh rule out DVT whether there is an abscess something that would need to be drained I would also like to know. 11/01/16; he still has weeping edema from a not fully epithelialized area on his left posterior calf. Most of the rest of this looks a lot better. He has completed his antibiotics. His thigh is a lot better. Duplex ultrasound did not show a DVT in the thigh 11/08/16; he comes in today with more Denuded surface epithelium from the posterior aspect of his calf. There is no real evidence of cellulitis. The superior aspect of his wrap appears to have put quite an indentation in his leg just below the knee and this may have contributed. He does not complain of pain or fever. We have been using silver alginate as the primary dressing. The area of cellulitis in the right thigh has totally resolved. He has been using his compression stockings once a week 11/15/16; the patient arrives today with more loss of epithelium from the posterior aspect of his left calf. He now has a fairly substantial wound in this area. The reason behind this deterioration isn't exactly clear although his edema is not well controlled. He states he feels he  is generally more swollen systemically. He is not complaining of chest pain shortness of breath fever. Tells me he has an appointment with his primary physician in early February. He is on 80 mg of oral Lasix a day. He claims compliance with the external compression pumps. He is not having any pain in his legs similar to what he has with his recurrent cellulitis 11/22/16; the patient arrives a follow-up of his large area on his left lateral calf. This looks somewhat better today. He came in earlier in the week for a dressing change since I saw him a week ago. He is not complaining of any pain no shortness of breath no chest pain 11/28/16; the patient arrives for follow-up of his large area on the left lateral calf this does not look better. In fact it is larger weeping edema. The surface of the wound does not look too bad. We have been using silver alginate although I'm not certain that this is a dressing issue. 12/05/16; again the patient follows up for a large wound on the left lateral and left posterior calf this does not look better. There continues to be weeping edema necrotic surface tissue. More worrisome than this once again there is erythema below the wound involving the distal Achilles and heel suggestive of cellulitis. He is on his feet working most of the day of this is not going well. We are changing his dressing twice a week to facilitate the drainage. 12/12/16; not much change in the overall dimensions of the large area on the left posterior calf. This is very inflamed looking. I gave him an. Doxycycline last week does not really seem to have helped. He found the wrap very painful indeed it seems to of dog into his legs superiorly and perhaps around the heel. He came in early today because the drainage had soaked through his dressings. 12/19/16- patient arrives for follow-up evaluation of his left lower extremity ulcers. He states that he is using his lymphedema pumps once daily when there  is "no drainage". He admits to not using his lipedema pumps while under current treatment. His blood sugars have been consistently between 150-200. 12/26/16; the patient is not using his compression pumps at home because of the wetness on his feet. I've advised him that  I think it's important for him to use this daily. He finds his feet too wet, he can put a plastic bag over his legs while he is in the pumps. Otherwise I think will be in a vicious circle. We are using silver alginate to the major area on his left posterior calf 01/02/17; the patient's posterior left leg has further of all into 3 open wounds. All of them covered with a necrotic surface. He claims to be using his compression pumps once a day. His edema control is marginal. Continue with silver alginate 01/10/17; the patient's left posterior leg actually looks somewhat better. There is less edema, less erythema. Still has 3 open areas covered with a necrotic surface requiring debridement. He claims to be using his compression pumps once a day his edema control is better 01/17/17; the patient's left posterior calf look better last week when I saw him and his wrap was changed 2 days ago. He has noted increasing pain in the left heel and arrives today with much larger wounds extensive erythema extending down into the entire heel area especially tender medially. He is not systemically unwell CBGs have been controlled no fever. Our intake nurse showed me limegreen drainage on his AVD pads. 01/24/17; his usual this patient responds nicely to antibiotics last week giving him Levaquin for presumed Pseudomonas. The whole entire posterior part of his leg is much better much less inflamed and in the case of his Achilles heel area much less tender. He has also had some epithelialization posteriorly there are still open areas here and still draining but overall considerably better 01/31/17- He has continue to tolerate the compression wraps. he states  that he continues to use the lymphedema pumps daily, and can increase to twice daily on the weekends. He is voicing no complaints or concerns regarding his LLE ulcers 02/07/17-he is here for follow-up evaluation. He states that he noted some erythema to the left medial and anterior thigh, which he states is new as of yesterday. He is concerned about recurrent cellulitis. He states his blood sugars have been slightly elevated, this morning in the 180s 02/14/17; he is here for follow-up evaluation. When he was last here there was erythema superiorly from his posterior wound in his anterior thigh. He was prescribed Levaquin however a culture of the wound surface grew MRSA over the phone I changed him to doxycycline on Monday and things seem to be a lot better. 02/24/17; patient missed his appointment on Friday therefore we changed his nurse visit into a physician visit today. Still using silver alginate on the large area of the posterior left thigh. He isn't new area on the dorsal left second toe 03/03/17; actually better today although he admits he has not used his external compression pumps in the last 2 days or so because of work responsibilities over the weekend. 03/10/17; continued improvement. External compression pumps once a day almost all of his wounds have closed on the posterior left calf. Better edema control 03/17/17; in general improved. He still has 3 small open areas on the lateral aspect of his left leg however most of the area on the posterior part of his leg is epithelialized. He has better edema control. He has an ABD pad under his stocking on the right anterior lower leg although he did not let us look at that today. 03/24/17; patient arrives back in clinic today with no open areas however there are areas on the posterior left calf and anterior left calf that  are less than 100% epithelialized. His edema is well controlled in the left lower leg. There is some pitting edema probably  lymphedema in the left upper thigh. He uses compression pumps at home once per day. I tried to get him to do this twice a day although he is very reticent. 04/01/2017 -- for the last 2 days he's had significant redness, tenderness and weeping and came in for an urgent visit today. 04/07/17; patient still has 6 more days of doxycycline. He was seen by Dr. Meyer Russel last Wednesday for cellulitis involving the posterior aspect, lateral aspect of his Involving his heel. For the most part he is better there is less erythema and less weeping. He has been on his feet for 12 hours o2 over the weekend. Using his compression pumps once a day 04/14/17 arrives today with continued improvement. Only one area on the posterior left calf that is not fully epithelialized. He has intense bilateral venous inflammation associated with his chronic venous insufficiency disease and secondary lymphedema. We have been using silver alginate to the left posterior calf wound In passing he tells [place] today that the right leg but we have not seen in quite some time has an open area on it but he doesn't want [place] to look at this today states he will show this to [place] next week. 04/21/17; there is no open area on his left leg although he still reports some weeping edema. He showed [place] his right leg today which is the first time we've seen this leg in a long time. He has a large area of open wound on the right leg anteriorly healthy granulation. Quite a bit of swelling in the right leg and some degree of venous inflammation. He told us about the right leg in passing last week but states that deterioration in the right leg really only happened over the weekend 04/28/17; there is no open area on the left leg although there is an irritated part on the posterior which is like a wrap injury. The wound on the right leg which was new from last week at least to [place] is a lot better. 05/05/17; still no open area on the left leg. Patient is using his new  compression stocking which seems to be doing a good job of controlling the edema. He states he is using his compression pumps once per day. The right leg still has an open wound although it is better in terms of surface area. Required debridement. A lot of pain in the posterior right Achilles marked tenderness. Usually this type of presentation this patient gives concern for an active cellulitis 05/12/17; patient arrives today with his major wound from last week on the right lateral leg somewhat better. Still requiring debridement. He was using his compression stocking on the left leg however that is reopened with superficial wounds anteriorly he did not have an open wound on this leg previously. He is still using his juxta light's once daily at night. He cannot find the time to do this in the morning as he has to be at work by 7 AM 05/19/17; right lateral leg wound looks improved. No debridement required. The concerning area is on the left posterior leg which appears to almost have a subcutaneous hemorrhagic component to it. We've been using silver alginate to all the wounds 05/26/17; the right lateral leg wound continues to look improved. However the area on the left posterior calf is a tightly adherent surface. [place] using silver alginate. Because of  the weeping edema in his legs there is very little good alternatives. 06/02/17; the patient left here last week looking quite good. Major wound on the left posterior calf and a small one on the right lateral calf. Both of these look satisfactory. He tells me that by Wednesday he had noted increased pain in the left leg and drainage. He called on Thursday and Friday to get an appointment here but we were blocked. He did not go to urgent care or his primary physician. He thinks he had a fever on Thursday but did not actually take his temperature. He has not been using his compression pumps on the left leg because of pain. I advised him to go to the  emergency room today for IV antibiotics for stents of left leg cellulitis but he has refused I have asked him to take 2 days off work to keep his leg elevated and he has refused this as well. In view of this I'm going to call him and Augmentin and doxycycline. 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 However after a detailed discussion regarding the risk benefits and alternatives he refuses to get admitted to the hospital. With no other recourse I will continue him on oral antibiotics as before and hopefully he'll have his infectious disease consultation this week. 06/16/2017 -- the patient was seen today by the nurse practitioner at infectious disease Ms. Dixon. 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 He has also been advise Lotrimin cream locally. she also had wise IV antibiotics for his condition if it flares up 06/23/17; patient arrives today with drainage bilaterally although the remaining wound on the left posterior calf after cleaning up today "highlighter yellow drainage" did not look too bad. Unfortunately he has had breakdown on the right anterior leg [previously this leg had not been open and he is using a black stocking] he went to see infectious disease and is been put on clindamycin 150 mg daily, I did not verify the dose although I'm not familiar with using clindamycin in this dosing range, perhaps for prophylaxisoo 06/27/17; I brought this patient back today to follow-up on the wound deterioration on the right lower leg together with surrounding cellulitis. I  started him on doxycycline 4 days ago. This area looks better however he comes in today with intense cellulitis on the medial part of his left thigh. This is not have a wound in this area. Extremely tender. We've been using silver alginate to the wounds on the right lower leg left lower leg with bilateral 4 layer compression he is using his external compression pumps once a day 07/04/17; patient's left medial thigh cellulitis looks better. He has not been using his compression pumps as his insert said it was contraindicated with cellulitis. His right leg continues to make improvements all the wounds are still open. We only have one remaining wound on the left posterior calf. Using silver alginate to all open areas. He is on doxycycline which I started a week ago and should be finishing I gave him Augmentin after Thursday's  visit for the severe cellulitis on the left medial thigh which fortunately looks better 07/14/17; the patient's left medial thigh cellulitis has resolved. The cellulitis in his right lower calf on the right also looks better. All of his wounds are stable to improved we've been using silver alginate he has completed the antibiotics I have given him. He has clindamycin 150 mg once a day prescribed by infectious disease for prophylaxis, I've advised him to start this now. We have been using bilateral Unna boots over silver alginate to the wound areas 07/21/17; the patient is been to see infectious disease who noted his recurrent problems with cellulitis. He was not able to tolerate prophylactic clindamycin therefore he is on amoxicillin 500 twice a day. He also had a second daily dose of Lasix added By Dr. Oneta Rack but he is not taking this. Nor is he being completely compliant with his compression pumps a especially not this week. He has 2 remaining wounds one on the right posterior lateral lower leg and one on the left posterior medial lower leg. 07/28/17; maintain on Amoxil 500 twice  a day as prophylaxis for recurrent cellulitis as ordered by infectious disease. The patient has Unna boots bilaterally. 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 He has bilateral Unna boots for compression and silver alginate to his wounds. Arrives today with his legs looking as good as I have seen him in quite some time. Not surprisingly his wounds look better as well with improvement on the right lateral leg venous insufficiency wound and also the left medial leg. He is still using the compression pumps once a day 08/11/17; both legs appear to be doing better wounds on the right lateral and left medial legs look better. Skin on the right leg quite good. He is been using silver alginate as the primary dressing. I'm going to use Anasept gel calcium alginate and maintain all the secondary dressings 08/18/17; the patient continues to actually do quite well. The area on his right lateral leg is just about closed the left medial also looks better although it is still moist in this area. His edema is well controlled we have been using Anasept gel with calcium alginate and the usual secondary dressings, 4 layer compression and once daily use of his compression pumps "always been able to manage 09/01/17; the patient continues to do reasonably well in spite of his trip to [place]. The area on the right lateral leg is epithelialized. Left is much better but still open. He has more edema and more chronic erythema on the left leg [venous inflammation] 09/08/17; he arrives today with no open wound on the right lateral leg and decently controlled edema. Unfortunately his left leg is not nearly as in his good situation as last week.he apparently had increasing edema starting on Saturday. He edema soaked through into his foot so used a plastic bag to walk around  his home. The area on the medial right leg which was his open area is about the same however he has lost surface epithelium on the left lateral which is new and he has significant pain in the Achilles area of the left foot. He is already on amoxicillin chronically for prophylaxis of cellulitis in the left leg 09/15/17; he is completed a week of doxycycline and the cellulitis in the left posterior leg and Achilles area  is as usual improved. He still has a lot of edema and fluid soaking through his dressings. There is no open wound on the right leg. He saw infectious disease NP today 09/22/17;As usual 1 we transition him from our compression wraps to his stockings things did not go well. He has several small open areas on the right leg. He states this was caused by the compression wrap on his skin although he did not wear this with the stockings over them. He has several superficial areas on the left leg medially laterally posteriorly. He does not have any evidence of active cellulitis especially involving the left Achilles The patient is traveling from [facility] Saturday going to [facility]. He states he isn't attempting to get an appointment with a heel objects wound center there to change his dressings. I am not completely certain whether this will work 10/06/17; the patient came in on Friday for a nurse visit and the nurse reported that his legs actually look quite good. He arrives in clinic today for his regular follow-up visit. He has a new wound on his left third toe over the PIP probably caused by friction with his footwear. He has small areas on the left leg and a very superficial but epithelialized area on the right anterior lateral lower leg. Other than that his legs look as good as I've seen him in quite some time. We have been using silver alginate Review of systems; no chest pain no shortness of breath other than this a 10 point review of systems negative 10/20/17; seen by Dr.  Meyer Russel last week. He had taken some antibiotics [doxycycline] that he had left over. Dr. Meyer Russel thought he had candida infection and declined to give him further antibiotics. He has a small wound remaining on the right lateral leg several areas on the left leg including a larger area on the left posterior several left medial and anterior and a small wound on the left lateral. The area on the left dorsal third toe looks a lot better. ROS; Gen.; no fever, respiratory no cough no sputum Cardiac no chest pain other than this 10 point review of system is negative 10/30/17; patient arrives today having fallen in the bathtub 3 days ago. It took him a while to get up. He has pain and maceration in the wounds on his left leg which have deteriorated. He has not been using his pumps he also has some maceration on the right lateral leg. 11/03/17; patient continues to have weeping edema especially in the left leg. This saturates his dressings which were just put on on 12/27. As usual the doxycycline seems to take care of the cellulitis on his lower leg. He is not complaining of fever, chills, or other systemic symptoms. He states his leg feels a lot better on the doxycycline I gave him empirically. He also apparently gets injections at his primary doctor's officeo Rocephin for cellulitis prophylaxis. I didn't ask him about his compression pump compliance today I think that's probably marginal. Arrives in the clinic with all of his dressings primary and secondary macerated full of fluid and he has bilateral edema 11/10/17; the patient's right leg looks some better although there is still a cluster of wounds on the right lateral. The left leg is inflamed with almost circumferential skin loss medially to laterally although we are still maintaining anteriorly. He does not have overt cellulitis there is a lot of drainage. He is not using compression pumps. We have been using silver alginate  to the wound areas, there  are not a lot of options here 11/17/17; the patient's right leg continues to be stable although there is still open wounds, better than last week. The inflammation in the left leg is better. Still loss of surface layer epithelium especially posteriorly. There is no overt cellulitis in the amount of edema and his left leg is really quite good, tells me he is using his compression pumps once a day. 11/24/17; patient's right leg has a small superficial wound laterally this continues to improve. The inflammation in the left leg is still improving however we have continuous surface layer epithelial loss posteriorly. There is no overt cellulitis in the amount of edema in both legs is really quite good. He states he is using his compression pumps on the left leg once a day for 5 out of 7 days 12/01/17; very small superficial areas on the right lateral leg continue to improve. Edema control in both legs is better today. He has continued loss of surface epithelialization and left posterior calf although I think this is better. We have been using silver alginate with large number of absorptive secondary dressings 4 layer on the left Unna boot on the right at his request. He tells me he is using his compression pumps once a day 12/08/17; he has no open area on the right leg is edema control is good here. ooOn the left leg however he has marked erythema and tenderness breakdown of skin. He has what appears to be a wrap injury just distal to the popliteal fossa. 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 There was swelling erythema and breakdown in the right leg. He has much worse skin breakdown in the left leg as well multiple open areas  medially and posteriorly as well as laterally. He tells me he has been using his compression pumps but tells me he feels that the drainage out of his leg is worse when he uses a compression pumps. To be fair to him he is been saying this for a while however I don't know that I have really been listening to this. I wonder if the compression pumps are working properly 12/22/17;. Once again he arrives with severe erythema, weeping edema from the left greater than right leg. Noncompliance with compression pumps. New this visit he is complaining of pain on the lateral aspect of the right leg and the medial aspect of his right thigh. He apparently saw his cardiologist Dr. [name] who was ordered an echocardiogram area and I think this is a step in the right direction 12/25/17; started his doxycycline Monday night. There is still intense erythema of the right leg especially in the anterior thigh although there is less tenderness. The erythema around the wound on the right lateral calf also is less tender. He still complaining of pain in the left heel. His wounds are about the same right lateral left medial left lateral. Superficial but certainly not close to closure. He denies being systemically unwell no fever chills no abdominal pain no diarrhea 12/29/17; back in follow-up of his extensive right calf and right thigh cellulitis. I added amoxicillin to cover possible doxycycline resistant strep. This seems to of done the trick he is in  much less pain there is much less erythema and swelling. He has his echocardiogram at 11:00 this morning. X-ray of the left heel was also negative. 01/05/18; the patient arrived with his edema under much better control. Now that he is retired he is able to use his compression pumps daily and sometimes twice a day per the patient. He has a wound on the right leg the lateral wound looks better. Area on the left leg also looks a lot better. He has no evidence of cellulitis in his  bilateral thighs I had a quick peak at his echocardiogram. He is in normal ejection fraction and normal left ventricular function. He has moderate pulmonary hypertension moderately reduced right ventricular function. One would have to wonder about chronic sleep apnea although he says he doesn't snore. He'll review the echocardiogram with his cardiologist. 01/12/18; the patient arrives with the edema in both legs under exemplary control. He is using his compression pumps daily and sometimes twice daily. His wound on the right lateral leg is just about closed. He still has some weeping areas on the posterior left calf and lateral left calf although everything is just about closed here as well. I have spoken with Aldean Baker who is the patient's nurse practitioner and infectious disease. She was concerned that the patient had not understood that the parenteral penicillin injections he was receiving for cellulitis prophylaxis was actually benefiting him. I don't think the patient actually saw that I would tend to agree we were certainly dealing with less infections although he had a serious one last month. 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 He is healed. He'll be placed in juxtalite compression wraps and increase his lymphedema pumps to twice daily. We will follow up again next week to ensure there are no issues with the new regiment. 01/20/18-he is here for evaluation of bilateral lower extremity weeping edema. Yesterday he was placed in compression wrap to the right lower extremity and compression stocking to left lower shrubbery. He states he uses lymphedema pumps last night and again this morning and noted a blister to the left lower extremity. On exam he was noted to have drainage to the right lower extremity. He will be placed in Unna boots bilaterally and follow-up next week 01/26/18; patient was actually discharged a week ago to his own juxta light  stockings only to return the next day with bilateral lower extremity weeping edema.he was placed in bilateral Unna boots. He arrives today with pain in the back of his left leg. There is no open area on the right leg however there is a linear/wrap injury on the left leg and weeping edema on the left leg posteriorly. I spoke with infectious disease about 10 days ago. They were disappointed that the patient elected to discontinue prophylactic intramuscular penicillin shots as they felt it was particularly beneficial in reducing the frequency of his cellulitis. I discussed this with the patient today. He does not share this view. He'll definitely need antibiotics today. Finally he is traveling to [place] and trauma leaving this Saturday and returning a week later and he does not travel with his pumps. He is going by car 01/30/18; patient was seen 4 days ago and brought back in today for review of cellulitis in the left leg posteriorly. I put him on amoxicillin this really hasn't helped as much as I might like. He is also worried because he is traveling to [facility] trauma by car. Finally we will  be rewrapping him. There is no open area on the right leg over his left leg has multiple weeping areas as usual 02/09/18; The same wrap on for 10 days. He did not pick up the last doxycycline I prescribed for him. He apparently took 4 days worth he already had. There is nothing open on his right leg and the edema control is really quite good. He's had damage in the left leg medially and laterally especially probably related to the prolonged use of Unna boots 02/12/18; the patient arrived in clinic today for a nurse visit/wrap change. He complained of a lot of pain in the left posterior calf. He is taking doxycycline that I previously prescribed for him. Unfortunately even though he used his stockings and apparently used to compression pumps twice a day he has weeping edema coming out of the lateral part of his  right leg. This is coming from the lower anterior lateral skin area. 02/16/18; the patient has finished his doxycycline and will finish the amoxicillin 2 days. The area of cellulitis in the left calf posteriorly has resolved. He is no longer having any pain. He tells me he is using his compression pumps at least once a day sometimes twice. 02/23/18; the patient finished his doxycycline and Amoxil last week. On Friday he noticed a small erythematous circle about the size of a quarter on the left lower leg just above his ankle. This rapidly expanded and he now has erythema on the lateral and posterior part of the thigh. This is bright red. Also has an area on the dorsal foot just above his toes and a tender area just below the left popliteal fossa. He came off his prophylactic penicillin injections at his own insistence one or 2 months ago. This is obviously deteriorated since then 03/02/18; patient is on doxycycline and Amoxil. Culture I did last week of the weeping area on the back of his left calf grew group B strep. I have therefore renewed the amoxicillin 500 3 times a day for a further week. He has not been systemically unwell. Still complaining of an area of discomfort right under his left popliteal fossa. There is no open wound on the right leg. He tells me that he is using his pumps twice a day on most days 03/09/18; patient arrives in clinic today completing his amoxicillin today. The cellulitis on his left leg is better. Furthermore he tells me that he had intramuscular penicillin shots that his primary care office today. However he also states that the wrap on his right leg fell down shortly after leaving clinic last week. He developed a large blister that was present when he came in for a nurse visit later in the week and then he developed intense discomfort around this area.He tells me he is using his compression pumps 03/16/18; the patient has completed his doxycycline. The infectious part  of this/cellulitis in the left heel area left popliteal area is a lot better. He has 2 open areas on the right calf. Still areas on the left calf but this is a lot better as well. 03/24/18; the patient arrives complaining of pain in the left popliteal area again. He thinks some of this is wrap injury. He has no open area on the right leg and really no open area on the left calf either except for the popliteal area. He claims to be compliant with the compression pumps 03/31/18; I gave him doxycycline last week because of cellulitis in the left popliteal area.  This is a lot better although the surface epithelium is denuded off and response to this. He arrives today with uncontrolled edema in the right calf area as well as a fingernail injury in the right lateral calf. There is only a few open areas on the left 04/06/18; I gave him amoxicillin doxycycline over the last 2 weeks that the amoxicillin should be completing currently. He is not complaining of any pain or systemic symptoms. The only open areas see has is on the right lateral lower leg paradoxically I cannot see anything on the left lower leg. He tells me he is using his compression pumps twice a day on most days. Silver alginate to the wounds that are open under 4 layer compression 04/13/18; he completed antibiotics and has no new complaints. Using his compression pumps. Silver alginate that anything that's opened 04/20/18; he is using his compression pumps religiously. Silver alginate 4 layer compression anything that's opened. He comes in today with no open wounds on the left leg but 3 on the right including a new one posteriorly. He has 2 on the right lateral and one on the right posterior. He likes Unna boots on the right leg for reasons that aren't really clear we had the usual 4 layer compression on the left. It may be necessary to move to the 4 layer compression on the right however for now I left them in the Unna boots 04/27/18; he is  using his compression pumps at least once a day. He has still the wounds on the right lateral calf. The area right posteriorly has closed. He does not have an open wound on the left under 4 layer compression however on the dorsal left foot just proximal to the toes and the left third toe 2 small open areas were identified 05/11/18; he has not uses compression pumps. The areas on the right lateral calf have coalesced into one large wound necrotic surface. On the left side he has one small wound anteriorly however the edema is now weeping out of a large part of his left leg. He says he wasn't using his pumps because of the weeping fluid. I explained to him that this is the time he needs to pump more 05/18/18; patient states he is using his compression pumps twice a day. The area on the right lateral large wound albeit superficial. On the left side he has innumerable number of small new wounds on the left calf particularly laterally but several anteriorly and medially. All these appear to have healthy granulated base these look like the remnants of blisters however they occurred under compression. The patient arrives in clinic today with his legs somewhat better. There is certainly less edema, less multiple open areas on the left calf and the right anterior leg looks somewhat better as well superficial and a little smaller. However he relates pain and erythema over the last 3-4 days in the thigh and I looked at this today. He has not been systemically unwell no fever no chills no change in blood sugar values 05/25/18; comes in today in a better state. The severe cellulitis on his left leg seems better with the Keflex. Not as tender. He has not been systemically unwell ooHard to find an open wound on the left lower leg using his compression pumps twice a day ooThe confluent wounds on his right lateral calf somewhat better looking. These will ultimately need debridement I didn't do this today. 06/01/18;  the severe cellulitis on the left anterior thigh  has resolved and he is completed his Keflex. ooThere is no open wound on the left leg however there is a superficial excoriation at the base of the third toe dorsally. Skin on the bottom of his left foot is macerated looking. ooThe left the wounds on the lateral right leg actually looks some better although he did require debridement of the top half of this wound area with an open curet 06/09/18 on evaluation today patient appears to be doing poorly in regard to his right lower extremity in particular this appears to likely be infected he has very thick purulent discharge along with a bright green tent to the discharge. This makes me concerned about the possibility of pseudomonas. He's also having increased discomfort at this point on evaluation. Fortunately there does not appear to be any evidence of infection spreading to the other location at this time. 06/16/18 on evaluation today patient appears to actually be doing fairly well. His ulcer has actually diminished in size quite significantly at this point which is good news. Nonetheless he still does have some evidence of infection he did see infectious disease this morning before coming here for his appointment. I did review the results of their evaluation and their note today. They did actually have him discontinue the Cipro and initiate treatment with linezolid at this time. He is doing this for the next seven days and they recommended a follow-up in four months with them. He is the keep a log of the need for intermittent antibiotic therapy between now and when he falls back up with infectious disease. This will help them gaze what exactly they need to do to try and help them out. 06/23/18; the patient arrives today with no open wounds on the left leg and left third toe healed. He is been using his compression pumps twice a day. On the right lateral leg he still has a sizable wound but this is a  lot better than last time I saw this. 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 Has been using silver alginate under 4 layer compression 06/30/18; the only open wound he has is on the right lateral leg and this looks healthy. No debridement is required. We have been using silver alginate. He does not have an open wound on the left leg. There is apparently some drainage from the dorsal proximal third toe on the left although I see no open wound here. 07/03/18 on evaluation today patient was actually here just for a nurse visit rapid change. However when he was here on Wednesday for his rat change due to having been healed on the left and then developing blisters we initiated the wrap again knowing that he would be back today for [place] to reevaluate and see were at. Unfortunately he has developed some cellulitis into the proximal portion of his right lower extremity even into the region of his thigh. 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 He was placed on one as what at that point. Nonetheless he is done with that and has been tolerating it well otherwise. Doxycycline which in the past really did not seem to be effective for him. Nonetheless I think the best option may be for [place] to definitely reinitiate the antibiotics for a longer period of time. 07/07/18; since I last saw this patient a week ago he has had  a difficult time. At that point he did not have an open wound on his left leg. We transitioned him into juxta light stockings. He was apparently in the clinic the next day with blisters on the left lateral and left medial lower calf. He also had weeping edema fluid. He was put back into a compression wrap. He was also in the clinic on Friday with intense erythema in his right thigh. Per the patient he was started on Bactrim however that didn't work at  all in terms of relieving his pain and swelling. He has taken 3 doxycycline that he had left over from last time and that seems to of helped. He has blistering on the right thigh as well. 07/14/18; the erythema on his right thigh has gotten better with doxycycline that he is finishing. 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 Presumably this cellulitis in the thigh was not related to that although I think this is a bit concerning going forward. He still has an area on the right lateral calf the blister on the right medial calf just below the knee that was discussed above. On the left 2 small open areas left medial and left lateral. Edema control is adequate. He is using his compression pumps twice a day 07/20/18; continued improvement in the condition of both legs especially the edema in his bilateral thighs. He tells me he is been losing weight through a combination of diet and exercise. He is using his compression pumps twice a day. So overall she made to the remaining wounds 07/27/2018; continued improvement in condition of both legs. His edema is well controlled. The area on the right lateral leg is just about closed he had one blisters show up on the medial left upper calf. We have him in 4 layer compression. He is going on a 10-day trip to [place], [place] and [place]. He will be driving. He wants to wear Unna boots because of the lessening amount of constriction. He will not use compression pumps while he is away 08/05/18 on evaluation today patient actually appears to be doing decently well all things considered in regard to his bilateral lower extremities. The worst ulcer is actually only posterior aspect of his left lower extremity with a four layer compression wrap cut into his leg a couple weeks back. He did have a trip and actually had [employer] for the trip that he is worn since he was last here.  Nonetheless he feels like the [employer] actually do better for him his swelling is up a little bit but he also with his trip was not taking his Lasix on a regular set schedule like he was supposed to be. He states that obviously the reason being that he cannot drive and keep going without having to urinate too frequently which makes it difficult. He did not have his pumps with him while he was away either which I think also maybe playing a role here too. 08/13/2018; the patient only has a small open wound on the right lateral calf which is a big improvement in the last month or 2. He also has the area posteriorly just below the posterior fossa on the left which I think was a wrap injury from several weeks ago. He has no current evidence of cellulitis. He tells me he is back into his compression pumps twice a day. He also tells me  that while he was at the laundromat somebody stole a section of his extremitease stockings 08/20/2018; back in the clinic with a much improved state. He only has small areas on the right lateral mid calf which is just about healed. This was is more substantial area for quite a prolonged period of time. He has a small open area on the left anterior tibia. The area on the posterior calf just below the popliteal fossa is closed today. He is using his compression pumps twice a day 08/28/2018; patient has no open wound on the right leg. He has a smattering of open areas on the calf with some weeping lymphedema. More problematically than that it looks as though his wraps of slipped down in his usual he has very angry upper area of edema just below the right medial knee and on the right lateral calf. He has no open area on his feet. The patient is traveling to [facility] next week. I will send him in an antibiotic. We will continue to wrap the right leg. We ordered extremitease stockings for him last week and I plan to transition the right leg to a stocking when he gets  home which will be in 10 days time. As usual he is very reluctant to take his pumps with him when he travels 09/07/2018; patient returns from [facility]. He shows me a picture of his left leg in the mid part of his trip last week with intense fire engine erythema. The picture look bad enough I would have considered sending him to the hospital. Instead he went to the wound care center in [facility]. They did not prescribe him antibiotics but he did take some doxycycline he had leftover from a previous visit. I had given him trimethoprim sulfamethoxazole before he left this did not work according to the patient. This is resulted in some improvement fortunately. He comes back with a large wound on the left posterior calf. Smaller area on the left anterior tibia. Denuded blisters on the dorsal left foot over his toes. Does not have much in the way of wounds on the right leg although he does have a very tender area on the right posterior area just below the popliteal fossa also suggestive of infection. He promises me he is back on his pumps twice a day 09/15/2018; the intense cellulitis in his left lower calf is a lot better. The wound area on the posterior left calf is also so better. However he has reasonably extensive wounds on the dorsal aspect of his second and third toes and the proximal foot just at the base of the toes. There is nothing open on the right leg 09/22/2018; the patient has excellent edema control in his legs bilaterally. He is using his external compression pumps twice a day. He has no open area on the right leg and only the areas in the left foot dorsally second and third toe area on the left side. He does not have any signs of active cellulitis. 10/06/2018; the patient has good edema control bilaterally. He has no open wound on the right leg. There is a blister in the posterior aspect of his left calf that we had to deal with today. He is using his compression pumps  twice a day. There is no signs of active cellulitis. We have been using silver alginate to the wound areas. He still has vulnerable areas on the base of his left first second toes dorsally He has a his extremities  stockings and we are going to transition him today into the stocking on the right leg. He is cautioned that he will need to continue to use the compression pumps twice a day. If he notices uncontrolled edema in the right leg he may need to go to 3 times a day. 10/13/2018; the patient came in for a nurse check on Friday he has a large flaccid blister on the right medial calf just below the knee. We unroofed this. He has this and a new area underneath the posterior mid calf which was undoubtedly a blister as well. He also has several small areas on the right which is the area we put his extremities stocking on. 10/19/2018; the patient went to see infectious disease this morning I am not sure if that was a routine follow-up in any case the doxycycline I had given him was discontinued and started on linezolid. He has not started this. It is easy to look at his left calf and the inflammation and think this is cellulitis however he is very tender in the tissue just below the popliteal fossa and I have no doubt that there is infection going on here. He states the problem he is having is that with the compression pumps the edema goes down and then starts walking the wrap falls down. We will see if we can adhere this. He has 1 or 2 minuscule open areas on the right still areas that are weeping on the posterior left calf, the base of his left second and third toes 10/26/18; back today in clinic with quite of skin breakdown in his left anterior leg. This may have been infection the area below the popliteal fossa seems a lot better however tremendous epithelial loss on the left anterior mid tibia area over quite inexpensive tissue. He has 2 blisters on the right side but no other open wound  here. 10/29/2018; came in urgently to see [place] today and we worked him in for review. He states that the 4 layer compression on the right leg caused pain he had to cut it down to roughly his mid calf this caused swelling above the wrap and he has blisters and skin breakdown today. As a result of the pain he has not been using his pumps. Both legs are a lot more edematous and there is a lot of weeping fluid. 11/02/18; arrives in clinic with continued difficulties in the right leg> left. Leg is swollen and painful. multiple skin blisters and new open areas especially laterally. He has not been using his pumps on the right leg. 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 He is not systemically unwell. 11/09/2018; the patient claims he is being compliant with his pumps. He is finished the doxycycline I gave him last week. Culture I did of the wound on the right lateral leg showed a few very resistant methicillin staph aureus. This was resistant to doxycycline. Nevertheless he states the pain in the leg is a lot better which makes me wonder if the cultured organism was not really what was causing the problem nevertheless this is a very dangerous organism to be culturing out of any wound. His right leg is still a lot larger than the left. He is using an Radio broadcast assistant on this area, he blames a 4-layer compression for causing the original skin breakdown which I doubt is true however I cannot talk him out of it. We have been using silver alginate to all  of these areas which were initially blisters 11/16/2018; patient is being compliant with his external compression pumps at twice a day. Miraculously he arrives in clinic today with absolutely no open wounds. He has better edema control on the left where he has been using 4 layer compression versus wound of wounds on the right and I pointed this out to him. There is no inflammation in the skin in his lower legs which is  also somewhat unusual for him. There is no open wounds on the dorsal left foot. He has extremitease stockings at home and I have asked him to bring these in next week. 11/25/18 patient's lower extremity on examination today on the left appears for the most part to be wound free. He does have an open wound on the lateral aspect of the right lower extremity but this is minimal compared to what I've seen in past. He does request that we go ahead and wrap the left leg as well even though there's nothing open just so hopefully it will not reopen in short order. 1/28; patient has superficial open wounds on the right lateral calf left anterior calf and left posterior calf. His edema control is adequate. 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 We have been using silver alginate as the primary dressing. He claims compliance with his compression pumps 2/4; patient has superficial open wounds on numerous areas of his left calf and again one on the left dorsal foot. The areas on the right lateral calf have healed. The cellulitis that I gave him doxycycline for last week is also resolved this was mostly on the left anterior calf just below the tibial tuberosity. His edema looks fairly well-controlled. He tells me he went to see his primary doctor today and had blood work ordered 2/11; once again he has several open areas on the left calf left tibial area. Most of these are small and appear to have healthy granulation. He does not have anything open on the right. The edema and control in his thighs is pretty good which is usually a good indication he has been using his pumps as requested. 2/18; he continues to have several small areas on the left calf and left tibial area. Most of these are small healthy granulation. We put him in his stocking on the right leg last week and he arrives with a superficial open area over the  right upper tibia and a fairly large area on the right lateral tibia in similar condition. His edema control actually does not look too bad, he claims to be using his compression pumps twice a day 2/25. Continued small areas on the left calf and left tibial area. New areas especially on the right are identified just below the tibial tuberosity and on the right upper tibia itself. There are also areas of weeping edema fluid even without an obvious wound. He does not have a considerable degree of lymphedema but clearly there is more edema here than his skin can handle. He states he is using the pumps twice a day. We have an Unna boot on the right and 4 layer compression on the left. 3/3; he continues to have an area on the right lateral calf and right posterior calf just below the popliteal fossa. There is a fair amount of tenderness around the wound on the popliteal fossa but I did not see any evidence of cellulitis, could just be  that the wrap came down and rubbed in this area. ooHe does not have an open area on the left leg however there is an area on the left dorsal foot at the base of the third toe ooWe have been using silver alginate to all wound areas 3/10; he did not have an open area on his left leg last time he was here a week ago. Today he arrives with a horizontal wound just below the tibial tuberosity and an area on the left lateral calf. He has intense erythema and tenderness in this area. The area is on the right lateral calf and right posterior calf better than last week. We have been using silver alginate as usual 3/18 - Patient returns with 3 small open areas on left calf, and 1 small open area on right calf, the skin looks ok with no significant erythema, he continues the UNA boot on right and 4 layer compression on left. The right lateral calf wound is closed , the right posterior is small area. we will continue silver alginate to the areas. Culture results from right  posterior calf wound is + MRSA sensitive to Bactrim but resistant to DOXY 01/27/19 on evaluation today patient's bilateral lower extremities actually appear to be doing fairly well at this point which is good news. He is been tolerating the dressing changes without complication. Fortunately she has made excellent improvement in regard to the overall status of his wounds. Unfortunately every time we cease wrapping him he ends up reopening in causing more significant issues at that point. Again I'm unsure of the best direction to take although I think the lymphedema clinic may be appropriate for him. 02/03/19 on evaluation today patient appears to be doing well in regard to the wounds that we saw him for last week unfortunately he has a new area on the proximal portion of his right medial/posterior lower extremity where the wrap somewhat slowed down and caused swelling and a blister to rub and open. Unfortunately this is the only opening that he has on either leg at this point. 02/17/19 on evaluation today patient's bilateral lower extremities appear to be doing well. He still completely healed in regard to the left lower extremity. In regard to the right lower extremity the area where the wrap and slid down and caused the blister still seems to be slightly open although this is dramatically better than during the last evaluation two weeks ago. I'm very pleased with the way this stands overall. 03/03/19 on evaluation today patient appears to be doing well in regard to his right lower extremity in general although he did have a new blister open this does not appear to be showing any evidence of active infection at this time. Fortunately there's No fevers, chills, nausea, or vomiting noted at this time. Overall I feel like he is making good progress it does feel like that the right leg will we perform the [employer] seems to do with a bit better than three layer wrap on the left which slid down on him. We  may switch to doing bilateral in the book wraps. 5/4; I have not seen Mr. Schoenbeck in quite some time. According to our case manager he did not have an open wound on his left leg last week. He had 1 remaining wound on the right posterior medial calf. He arrives today with multiple openings on the left leg probably were blisters and/or wrap injuries from Unna boots. I do not think the Unna boot's  will provide adequate compression on the left. I am also not clear about the frequency he is using the compression pumps. 03/17/19 on evaluation today patient appears to be doing excellent in regard to his lower extremities compared to last week's evaluation apparently. He had gotten significantly worse last week which is unfortunate. The [employer] wrap on the left did not seem to do very well for him at all and in fact it didn't control his swelling significantly enough he had an additional outbreak. Subsequently we go back to the four layer compression wrap on the left. This is good news. At least in that he is doing better and the wound seem to be killing him. He still has not heard anything from the lymphedema clinic. 03/24/19 on evaluation today patient actually appears to be doing much better in regard to his bilateral lower Trinity as compared to last week when I saw him. Fortunately there's no signs of active infection at this time. He has been tolerating the dressing changes without complication. Overall I'm extremely pleased with the progress and appearance in general. 04/07/19 on evaluation today patient appears to be doing well in regard to his bilateral lower extremities. His swelling is significantly down from where it was previous. With that being said he does have a couple blisters still open at this point but fortunately nothing that seems to be too severe and again the majority of the larger openings has healed at this time. 04/14/19 on evaluation today patient actually appears to be doing  quite well in regard to his bilateral lower extremities in fact I'm not even sure there's anything significantly open at this time at any site. Nonetheless he did have some trouble with these wraps where they are somewhat irritating him secondary to the fact that he has noted that the graph wasn't too close down to the end of this foot in a little bit short as well up to his knee. Otherwise things seem to be doing quite well. 04/21/19 upon evaluation today patient's wound bed actually showed evidence of being completely healed in regard to both lower extremities which is excellent news. There does not appear to be any signs of active infection which is also good news. I'm very pleased in this regard. No fevers, chills, nausea, or vomiting noted at this time. 04/28/19 on evaluation today patient appears to be doing a little bit worse in regard to both lower extremities on the left mainly due to the fact that when he went infection disease the wrap was not wrapped quite high enough he developed a blister above this. On the right he is a small open area of nothing too significant but again this is continuing to give him some trouble he has been were in the Velcro compression that he has at home. 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 He's been tolerating the dressing changes without complication. Fortunately there's no signs of active infection at this time. No fevers, chills, nausea, or vomiting noted at this time. We have been trying to get an appointment with her lymphedema clinic in [facility] but unfortunately nobody can get them on phone with not been able to even fax information over the patient likewise is not been able to get in touch with them. Overall I'm not sure exactly what's going on here with to reach out again today. 05/12/19 on evaluation today patient actually appears to be doing about the same  in regard  to his bilateral lower Trinity ulcers. Still having a lot of drainage unfortunately. He tells me especially in the left but even on the right. There's no signs of active infection which is good news we've been using so ratcheted up to this point. 05/19/19 on evaluation today patient actually appears to be doing quite well with regard to his left lower extremity which is great news. Fortunately in regard to the right lower extremity has an issues with his wrap and he subsequently did remove this from what I'm understanding. Nonetheless long story short is what he had rewrapped once he removed it subsequently had maggots underneath this wrap whenever he came in for evaluation today. With that being said they were obviously completely cleaned away by the nursing staff. The visit today which is excellent news. However he does appear to potentially have some infection around the right ankle region where the maggots were located as well. He will likely require anabiotic therapy today. 05/26/19 on evaluation today patient actually appears to be doing much better in regard to his bilateral lower extremities. I feel like the infection is under much better control. With that being said there were maggots noted when the wrap was removed yet again today. Again this could have potentially been left over from previous although at this time there does not appear to be any signs of significant drainage there was obviously on the wrap some drainage as well this contracted gnats or otherwise. Either way I do not see anything that appears to be doing worse in my pinion and in fact I think his drainage has slowed down quite significantly likely mainly due to the fact to his infection being under better control. 06/02/2019 on evaluation today patient actually appears to be doing well with regard to his bilateral lower extremities there is no signs of active infection at this time which is great news. With that being said  he does have several open areas more so on the right than the left but nonetheless these are all significantly better than previously noted. 06/09/2019 on evaluation today patient actually appears to be doing well. His wrap stayed up and he did not cause any problems he had more drainage on the right compared to the left but overall I do not see any major issues at this time which is great news. 06/16/2019 on evaluation today patient appears to be doing excellent with regard to his lower extremities the only area that is open is a new blister that can have opened as of today on the medial ankle on the left. Other than this he really seems to be doing great I see no major issues at this point. 06/23/2019 on evaluation today patient appears to be doing quite well with regard to his bilateral lower extremities. In fact he actually appears to be almost completely healed there is a small area of weeping noted of the right lower extremity just above the ankle. Nonetheless fortunately there is no signs of active infection at this time which is good news. No fevers, chills, nausea, vomiting, or diarrhea. 8/24; the patient arrived for a nurse visit today but complained of very significant pain in the left leg and therefore I was asked to look at this. Noted that he did not have an open area on the left leg last week nevertheless this was wrapped. The patient states that he is not been able to put his compression pumps on the left leg because of the discomfort.  He has not been systemically unwell 06/30/2019 on evaluation today patient unfortunately despite being excellent last week is doing much worse with regard to his left lower extremity today. In fact he had to come in for a nurse on Monday where his left leg had to be rewrapped due to excessive weeping Dr. Leanord Hawking placed him on doxycycline at that point. Fortunately there is no signs of active infection Systemically at this time which is good news. 07/07/2019  in regard to the patient's wounds today he actually seems to be doing well with his right lower extremity there really is nothing open or draining at this point this is great news. Unfortunately the left lower extremity is given him additional trouble at this time. There does not appear to be any signs of active infection nonetheless he does have a lot of edema and swelling noted at this point as well as blistering all of which has led to a much more poor appearing leg at this time compared to where it was 2 weeks ago when it was almost completely healed. Obviously this is a little discouraging for the patient. He is try to contact the lymphedema clinic in [place] he has not been able to get through to them. 07/14/2019 on evaluation today patient actually appears to be doing slightly better with regard to his left lower extremity ulcers. Overall I do feel like at least at the top of the wrap that we have been placing this area has healed quite nicely and looks much better. The remainder of the leg is showing signs of improvement. Unfortunately in the thigh area he still has an open region on the left and again on the right he has been utilizing just a Band-Aid on an area that also opened on the thigh. Again this is an area that were not able to wrap although we did do an Ace wrap to provide some compression that something that obviously is a little less effective than the compression wraps we have been using on the lower portion of the leg. He does have an appointment with the lymphedema clinic in [place] [facility] on Friday. 07/21/2019 on evaluation today patient appears to be doing better with regard to his lower extremity ulcers. He has been tolerating the dressing changes without complication. Fortunately there is no signs of active infection at this time. No fevers, chills, nausea, vomiting, or diarrhea. I did receive the paperwork from the physical therapist at the lymphedema clinic in  [place]. Subsequently I signed off on that this morning and sent that back to him for further progression with the treatment plan. 07/28/2019 on evaluation today patient appears to be doing very well with regard to his right lower extremity where I do not see any open wounds at this point. Fortunately he is feeling great as far as that is concerned as well. In regard to the left lower extremity he has been having issues with still several areas of weeping and edema although the upper leg is doing better his lower leg still I think is going require the compression wrap at this time. No fevers, chills, nausea, vomiting, or diarrhea. 08/04/2019 on evaluation today patient unfortunately is having new wounds on the right lower extremity. Again we have been using Unna boot wrap on that side. We switched him to using his juxta lite wrap at home. With that being said he tells me he has been using it although his legs extremely swollen and to be honest really does not appear that  he has been. I cannot know that for sure however. Nonetheless he has multiple new wounds on the right lower extremity at this time. Obviously we will have to see about getting this rewrapped for him today. 08/11/2019 on evaluation today patient appears to be doing fairly well with regard to his wounds. He has been tolerating the dressing changes including the compression wraps without complication. He still has a lot of edema in his upper thigh regions bilaterally he is supposed to be seeing the lymphedema clinic on the 15th of this month once his wraps arrive for the upper part of his legs. 08/18/2019 on evaluation today patient appears to be doing well with regard to his bilateral lower extremities at this point. He has been tolerating the dressing changes without complication. Fortunately there is no signs of active infection which is also good news. He does have a couple weeping areas on the first and second toe of the right  foot he also has just a small area on the left foot upper leg and a small area on the left lower leg but overall he is doing quite well in my opinion. He is supposed to be getting his wraps shortly in fact tomorrow and then subsequently is seeing the lymphedema clinic next Wednesday on the 21st. Of note he is also leaving on the 25th to go on vacation for a week to the beach. For that reason and since there is some uncertainty about what there can be doing at lymphedema clinic next Wednesday I am get a make an appointment for next Friday here for [place] to see what we need to do for him prior to him leaving for vacation. 10/23; patient arrives in considerable pain predominantly in the upper posterior calf just distal to the popliteal fossa also in the wound anteriorly above the major wound. This is probably cellulitis and he has had this recurrently in the past. He has no open wound on the right side and he has had an Radio broadcast assistant in that area. Finally I note that he has an area on the left posterior calf which by enlarge is mostly epithelialized. This protrudes beyond the borders of the surrounding skin in the setting of dry scaly skin and lymphedema. The patient is leaving for [facility] on Sunday. Per his longstanding pattern, he will not take his compression pumps with him predominantly out of fear that they will be stolen. He therefore asked that we put a Unna boot back on the right leg. He will also contact the wound care center in [facility] to see if they can change his dressing in the mid week. 11/3; patient returned from his vacation to [facility]. He was seen on 1 occasion at their wound care center. They did a 2 layer compression system as they did not have our 4-layer wrap. I am not completely certain what they put on the wounds. They did not change the Unna boot on the right. The patient is also seeing a lymphedema specialist physical therapist in [place]. It appears that he  has some compression sleeve for his thighs which indeed look quite a bit better than I am used to seeing. He pumps over these with his external compression pumps. 11/10; the patient has a new wound on the right medial thigh otherwise there is no open areas on the right. He has an area on the left leg posteriorly anteriorly and medially and an area over the left second toe. We have been using  silver alginate. He thinks the injury on his thigh is secondary to friction from the compression sleeve he has. 11/17; the patient has a new wound on the right medial thigh last week. He thinks this is because he did not have a underlying stocking for his thigh juxta lite apparatus. He now has this. The area is fairly large and somewhat angry but I do not think he has underlying cellulitis. ooHe has a intact blister on the right anterior tibial area. ooSmall wound on the right great toe dorsally ooSmall area on the medial left calf. 11/30; the patient does not have any open areas on his right leg and we did not take his juxta lite stocking off. However he states that on Friday his compression wrap fell down lodging around his upper mid calf area. As usual this creates a lot of problems for him. He called urgently today to be seen for a nurse visit however the nurse visit turned into a provider visit because of extreme erythema and pain in the left anterior tibia extending laterally and posteriorly. The area that is problematic is extensive 10/06/2019 upon evaluation today patient actually appears to be doing poorly in regard to his left lower extremity. He Dr. Leanord Hawking did place him on doxycycline this past Monday apparently due to the fact that he was doing much worse in regard to this left leg. Fortunately the doxycycline does seem to be helping. Unfortunately we are still having a very difficult time getting his edema under any type of control in order to anticipate discharge at some point. The only way  were really able to control his lymphedema really is with compression wraps and that has only even seemingly temporary. He has been seeing a lymphedema clinic they are trying to help in this regard but still this has been somewhat frustrating in general for the patient. 10/13/19 on evaluation today patient appears to be doing excellent with regard to his right lower extremity as far as the wounds are concerned. His swelling is still quite extensive unfortunately. He is still having a lot of drainage from the thigh areas bilaterally which is unfortunate. He's been going to lymphedema clinic but again he still really does not have this edema under control as far as his lower extremities are concern. With regard to his left lower extremity this seems to be improving and I do believe the doxycycline has been of benefit for him. He is about to complete the doxycycline. 10/20/2019 on evaluation today patient appears to be doing poorly in regard to his bilateral lower extremities. More in the right thigh he has a lot of irritation at this site unfortunately. In regard to the left lower extremity the wrap was not quite as high it appears and does seem to have caused him some trouble as well. Fortunately there is no evidence of systemic infection though he does have some blue-green drainage which has me concerned for the possibility of Pseudomonas. He tells me he is previously taking Cipro without complications and he really does not care for Levaquin however due to some of the side effects he has. He is not allergic to any medications specifically antibiotics that were aware of. 10/27/2019 on evaluation today patient actually does appear to be for the most part doing better when compared to last week's evaluation. With that being said he still has multiple open wounds over the bilateral lower extremities. He actually forgot to start taking the Cipro and states that he still has the  whole bottle. He does  have several new blisters on left lower extremity today I think I would recommend he go ahead and take the Cipro based on what I am seeing at this point. 12/30-Patient comes at 1 week visit, 4 layer compression wraps on the left and Unna boot on the right, primary dressing Xtrasorb and silver alginate. Patient is taking his Cipro and has a few more days left probably 5-6, and the legs are doing better. He states he is using his compressions devices which I believe he has 11/10/2019 on evaluation today patient actually appears to be much better than last time I saw him 2 weeks ago. His wounds are significantly improved and overall I am very pleased in this regard. Fortunately there is no signs of active infection at this time. He is just a couple of days away from completing Cipro. Overall his edema is much better he has been using his lymphedema pumps which I think is also helping at this point. 11/17/2019 on evaluation today patient appears to be doing excellent in regard to his wounds in general. His legs are swollen but not nearly as much as they have been in the past. Fortunately he is tolerating the compression wraps without complication. No fevers, chills, nausea, vomiting, or diarrhea. He does have some erythema however in the distal portion of his right lower extremity specifically around the forefoot and toes there is a little bit of warmth here as well. 11/24/2019 on evaluation today patient appears to be doing well with regard to his right lower extremity I really do not see any open wounds at this point. His left lower extremity does have several open areas and his right medial thigh also is open. Other than this however overall the patient seems to be making good progress and I am very pleased at this point. 12/01/2019 on evaluation today patient appears to be doing poorly at this point in regard to his left lower extremity has several new blisters despite the fact that we have him in  compression wraps. In fact he had a 4-layer compression wrap, his upper thigh wrapped from lymphedema clinic, and a juxta light over top of the 4 layer compression wrap the lymphedema clinic applied and despite all this he still develop blisters underneath. Obviously this does have me concerned about the fact that unfortunately despite what we are doing to try to get wounds healed he continues to have new areas arise I do not think he is ever good to be at the point where he can realistically just use wraps at home to keep things under control. Typically when we heal him it takes about 1-2 days before he is back in the clinic with severe breakdown and blistering of his lower extremities bilaterally. This is happened numerous times in the past. Unfortunately I think that we may need some help as far as overall fluid overload to kind of limit what we are seeing and get things under better control. 12/08/2019 on evaluation today patient presents for follow-up concerning his ongoing bilateral lower extremity edema. Unfortunately he is still having quite a bit of swelling the compression wraps are controlling this to some degree but he did see Dr. [name] his cardiologist I do have that available for review today as far as the appointment was concerned that was on 12/06/2019. Obviously that she has been 2 days ago. The patient states that he is only been taking the Lasix 80 mg 1 time a day he had  told me previously he was taking this twice a day. Nonetheless Dr. Debara Pickett recommended this be up to 80 mg 2 times a day for the patient as he did appear to be fluid overloaded. With that being said the patient states he did this yesterday and he was unable to go anywhere or do anything due to the fact that he was constantly having to urinate. Nonetheless I think that this is still good to be something that is important for him as far as trying to get his edema under control at all things that he is going to be able to  just expect his wounds to get under control and things to be better without going through at least a period of time where he is trying to stabilize his fluid management in general and I think increasing the Lasix is likely the first step here. It was also mentioned the possibility that the patient may require metolazone. With that being said he wanted to have the patient take Lasix twice a day first and then reevaluating 2 months to see where things stand. 12/15/2019 upon evaluation today patient appears to be doing regard to his legs although his toes are showing some signs of weeping especially on the left at this point to some degree on the right. There does not appear to be any signs of active infection and overall I do feel like the compression wraps are doing well for him but he has not been able to take the Lasix at home and the increased dose that Dr. Debara Pickett recommended. He tells me that just not go to be feasible for him. Nonetheless I think in this case he should probably send a message to Dr. Debara Pickett in order to discuss options from the standpoint of possible admission to get the fluid off or otherwise going forward. 12/22/2019 upon evaluation today patient appears to be doing fairly well with regard to his lower extremities at this point. In fact he would be doing excellent if it was not for the fact that his right anterior thigh apparently had an allergic reaction to adhesive tape that he used. The wound itself that we have been monitoring actually appears to be healed. There is a lot of irritation at this point. 12/29/2019 upon evaluation today patient appears to be doing well in regard to his lower extremities. His left medial thigh is open and somewhat draining today but this is the only region that is open the right has done much better with the treatment utilizing the steroid cream that I prescribed for him last week. Overall I am pleased in that regard. Fortunately there is no signs of  active infection at this time. No fevers, chills, nausea, vomiting, or diarrhea. 01/05/2020 upon evaluation today patient appears to be doing more poorly in regard to his right lower extremity at this point upon evaluation today. Unfortunately he continues to have issues in this regard and I think the biggest issue is controlling his edema. This obviously is not very well controlled at this point is been recommended that he use the Lasix twice a day but he has not been able to do that. Unfortunately I think this is leading to an issue where honestly he is not really able to effectively control his edema and therefore the wounds really are not doing significantly better. I do not think that he is going to be able to keep things under good control unless he is able to control his edema much better. I  discussed this again in great detail with him today. 01/12/2020 good news is patient actually appears to be doing quite well today at this point. He does have an appointment with lymphedema clinic tomorrow. His legs appear healed and the toe on the left is almost completely healed. In general I am very pleased with how things stand at this point. 01/19/2020 upon evaluation today patient appears to actually be doing well in regard to his lower extremities there is nothing open at this point. Fortunately he has done extremely well more recently. Has been seeing lymphedema clinic as well. With that being said he has Velcro wraps for his lower legs as well as his upper legs. The only wound really is on his toe which is the right great toe and this is barely anything even there. With all that being said I think it is good to be appropriate today to go ahead and switch him over to the Velcro compression wraps. Objective Constitutional Obese and well-hydrated in no acute distress. Vitals Time Taken: 10:52 AM, Height: 70 in, Weight: 380.2 lbs, BMI: 54.5, Temperature: 97.9 F, Pulse: 69 bpm, Respiratory Rate: 20  breaths/min, Blood Pressure: 146/67 mmHg. Respiratory normal breathing without difficulty. Psychiatric this patient is able to make decisions and demonstrates good insight into disease process. Alert and Oriented x 3. pleasant and cooperative. General Notes: Patient's wound bed currently showed signs of good epithelization at this time in regard to all wound locations. Overall I think he is doing well at this point. With that being said he is getting need to transition to the Velcro wraps exclusively at this time since he is completely healed and we will plan to see him in 1 week just to make sure everything is still doing okay. Integumentary (Hair, Skin) Wound #160 status is Healed - Epithelialized. Original cause of wound was Gradually Appeared. The wound is located on the Left Toe Fourth. The wound measures 0cm length x 0cm width x 0cm depth; 0cm^2 area and 0cm^3 volume. Wound #161 status is Open. Original cause of wound was Gradually Appeared. The wound is located on the Right Toe Great. The wound measures 1.5cm length x 0.5cm width x 0.1cm depth; 0.589cm^2 area and 0.059cm^3 volume. There is no tunneling or undermining noted. There is a medium amount of serosanguineous drainage noted. The wound margin is distinct with the outline attached to the wound base. There is large (67-100%) red, pink granulation within the wound bed. There is no necrotic tissue within the wound bed. 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 Dressing  Change Frequency: Wound #161 Right Toe Great: Change dressing three times week. Skin Barriers/Peri-Wound Care: Moisturizing lotion - both legs nightly Wound Cleansing: May shower and wash wound with soap and water. Primary Wound Dressing: Wound #161 Right Toe Great: Calcium Alginate with Silver Secondary Dressing: Wound #161 Right Toe Great: Kerlix/Rolled Gauze Dry Gauze Edema Control: Avoid standing for long periods of time Elevate legs to the level of the heart or above for 30 minutes daily and/or when sitting, a frequency of: - throughout the day Exercise regularly Support Garment 20-30 mm/Hg pressure to: -  juxtalite compression garment both legs daily Segmental Compressive Device. - [facility] pumps 60 min 2 times per day Other: - [facility] wraps to upper leg per [facility] 1. I would recommend at this point that we go ahead and discontinue the compression wraps and we will initiate treatment with his Velcro compression wraps. The patient needs to have these on at all times during the day he also needs to be elevating his legs and continue to use his diuretics as ordered. 2. With regard to his right great toe we will put a small alginate dressing over this it appears to be very minimal at this point and to be honest I do not see anything that I think is good to be a problem here there is no signs of infection. We will see patient back for reevaluation in 1 week here in the clinic. If anything worsens or changes patient will contact our office for additional recommendations. Electronic Signature(s) Signed: 01/19/2020 11:57:57 AM By: Lenda Kelp PA-C Entered By: Lenda Kelp on 01/19/2020 11:57:57 -------------------------------------------------------------------------------- SuperBill Details Patient Name: Date of Service: LAYTH, CEREZO 01/19/2020 Medical Record [id number] Patient Account Number: [id number] Date of Birth/Sex: Treating RN: Mar 04, 1951 (69  y.o. [name] Primary Care Provider: Nicoletta Ba Other Clinician: Referring Provider: Treating Provider/Extender:[name], [name], [name] in Treatment: 208 Diagnosis Coding ICD-10 Codes Code Description 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 skin L97.221 Non-pressure chronic ulcer of left calf limited to breakdown of skin 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 of bilateral lower extremity I89.0 Lymphedema, not elsewhere classified E11.622 Type 2 diabetes mellitus with other skin ulcer 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, unspecified L03.116 Cellulitis of left lower limb Facility Procedures CPT4 Code: 91478295 Description: 99213 - WOUND CARE VISIT-LEV 3 EST PT Modifier: Quantity: 1 Physician Procedures CPT4: Description Modifier Quantity Code [id number] 99213 - WC PHYS LEVEL 3 - EST PT 1 ICD-10 Diagnosis Description 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 skin L97.221 Non-pressure chronic ulcer of left calf limited to  breakdown of skin 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 of bilateral lower extremity I89.0 Lymphedema, not elsewhere classified Electronic Signature(s) Signed: 01/19/2020 11:58:16 AM By: Lenda Kelp PA-C Entered By: Lenda Kelp on 01/19/2020 11:58:13

## 2020-01-20 ENCOUNTER — Encounter (HOSPITAL_BASED_OUTPATIENT_CLINIC_OR_DEPARTMENT_OTHER): Payer: Medicare Other | Admitting: Internal Medicine

## 2020-01-20 ENCOUNTER — Other Ambulatory Visit: Payer: Self-pay

## 2020-01-20 DIAGNOSIS — E11622 Type 2 diabetes mellitus with other skin ulcer: Secondary | ICD-10-CM | POA: Diagnosis not present

## 2020-01-20 DIAGNOSIS — L97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 right calf limited to breakdown of skin: Secondary | ICD-10-CM | POA: Diagnosis not present

## 2020-01-20 DIAGNOSIS — I890 Lymphedema, not elsewhere classified: Secondary | ICD-10-CM | POA: Diagnosis not present

## 2020-01-20 DIAGNOSIS — E1140 Type 2 diabetes mellitus with diabetic neuropathy, unspecified: Secondary | ICD-10-CM | POA: Diagnosis not present

## 2020-01-20 DIAGNOSIS — L03116 Cellulitis of left lower limb: Secondary | ICD-10-CM | POA: Diagnosis not present

## 2020-01-20 DIAGNOSIS — I10 Essential (primary) hypertension: Secondary | ICD-10-CM | POA: Diagnosis not present

## 2020-01-20 DIAGNOSIS — M109 Gout, unspecified: Secondary | ICD-10-CM | POA: Diagnosis not present

## 2020-01-20 DIAGNOSIS — L97221 Non-pressure chronic ulcer of left calf limited to breakdown of skin: Secondary | ICD-10-CM | POA: Diagnosis not present

## 2020-01-20 DIAGNOSIS — I87333 Chronic venous hypertension (idiopathic) with ulcer and inflammation of bilateral lower extremity: Secondary | ICD-10-CM | POA: Diagnosis not present

## 2020-01-20 DIAGNOSIS — E11621 Type 2 diabetes mellitus with foot ulcer: Secondary | ICD-10-CM | POA: Diagnosis not present

## 2020-01-24 NOTE — Progress Notes (Signed)
[name], [name] ([id number]) Visit Report for 01/20/2020 Arrival Information Details Patient Name: Date of Service: [name], [name] 01/20/2020 1:45 PM Medical Record [name]:[id number] Patient Account Number: [id number] Date of Birth/Sex: Treating RN: 04/16/1951 (69 y.o. [name] Primary Care [name]: [name] Other Clinician: Referring [name]: Treating [name]/Extender:[name], [name], [name] in Treatment: 208 Visit Information History Since Last Visit Walker Added or deleted any medications: No Patient Arrived: Any new allergies or adverse reactions: No Arrival Time: 14:54 Had a fall or experienced change in No Accompanied By: alone activities of daily living that may affect Transfer Assistance: None risk of falls: Patient Identification Verified: Yes Signs or symptoms of abuse/neglect since last No Secondary Verification Process Completed: Yes visito Patient Requires Transmission-Based No Hospitalized since last visit: No Precautions: Implantable device outside of the clinic excluding No Patient Has Alerts: Yes cellular tissue based products placed in the center since last visit: Pain Present Now: No Electronic Signature(s) Signed: 01/24/2020 5:18:21 PM By: Kevin Hurst RN, BSN Entered By: [name] on 01/20/2020 14:55:04 -------------------------------------------------------------------------------- Compression Therapy Details Patient Name: Date of Service: [name] 01/20/2020 1:45 PM Medical Record [name]:[id number] Patient Account Number: [id number] Date of Birth/Sex: Treating RN: 08-04-1951 (69 y.o. [name] Primary Care [name]: [name] Other Clinician: Referring [name]: Treating Keysi Oelkers/Extender:[name], [name], [name] in Treatment: 208 Compression Therapy Performed for Wound NonWound Condition Lymphedema - Left Leg Assessment: Performed By: Clinician Kevin Hurst,  RN Compression Type: Four Layer Electronic Signature(s) Signed: 01/24/2020 5:18:21 PM By: Kevin Hurst RN, BSN Entered By: [name] on 01/20/2020 14:56:55 -------------------------------------------------------------------------------- Encounter Discharge Information Details Patient Name: Date of Service: [name] 01/20/2020 1:45 PM Medical Record [name]:[id number] Patient Account Number: [id number] Date of Birth/Sex: Treating RN: 07-17-1951 (69 y.o. [name] Primary Care [name]: [name] Other Clinician: Referring [name]: Treating [name]/Extender:[name], [name], [name] in Treatment: 208 Encounter Discharge Information Items Discharge Condition: Stable Ambulatory Status: Walker Discharge Destination: Home Transportation: Private Auto Accompanied By: alone Schedule Follow-up Appointment: Yes Clinical Summary of Care: Patient Declined Electronic Signature(s) Signed: 01/24/2020 5:18:21 PM By: Kevin Hurst RN, BSN Entered By: [name] on 01/20/2020 14:58:29 -------------------------------------------------------------------------------- Patient/Caregiver Education Details Patient Name: Date of Service: [name] 3/18/2021andnbsp1:45 PM Medical Record [name]:[id number] Patient Account Number: [id number] Date of Birth/Gender: Treating RN: 23-Mar-1951 (68 y.o. [name] Primary Care Physician: [name] Other Clinician: Referring Physician: Treating Physician/Extender:[name], [name], [name] in Treatment: 208 Education Assessment Education Provided To: Patient Education Topics Provided Venous: Methods: Explain/Verbal Responses: State content correctly Wound/Skin Impairment: Methods: Explain/Verbal Responses: State content correctly Electronic Signature(s) Signed: 01/24/2020 5:18:21 PM By: Kevin Hurst RN, BSN Entered By: [name] on 01/20/2020  14:58:16 -------------------------------------------------------------------------------- Wound Assessment Details Patient Name: Date of Service: [name] 01/20/2020 1:45 PM Medical Record [name]:[id number] Patient Account Number: [id number] Date of Birth/Sex: Treating RN: 04-07-1951 (69 y.o. [name] Primary Care [name]: [name] Other Clinician: Referring [name]: Treating Kaydense Rizo/Extender:[name], [name], [name] in Treatment: 208 Wound Status Wound Number: [id number] Primary Lymphedema Etiology: Wound Location: Right Toe Great Wound Open Wounding Event: Gradually Appeared Status: Date Acquired: 01/19/2020 Comorbid Chronic sinus problems/congestion, [name] Of Treatment: 0 History: Arrhythmia, Hypertension, Peripheral Arterial Clustered Wound: No Disease, Type II Diabetes, History of Burn, Gout, Confinement Anxiety Wound Measurements Length: (cm) 1.5 % Reduct Width: (cm) 0.5 % Reduct Depth: (cm) 0.1 Epitheli Area: (cm) 0.589 Tunneli Volume: (cm) 0.059 Undermi Wound Description Classification: Full Thickness Without Exposed Support Foul Od Structures Slough/  Wound Distinct, outline attached Margin: Exudate Medium Amount: Exudate Serosanguineous Type: Exudate red, brown Color: Wound Bed Granulation Amount: Large (67-100%) Granulation Quality: Red, Pink Fascia Necrotic Amount: None Present (0%) Fat Lay Tendon Muscle Joint Expose Bone Exposed or After Cleansing: No Fibrino No Exposed Structure Exposed: No er (Subcutaneous Tissue) Exposed: No Exposed: No Exposed: No d: No : No ion in Area: 0% ion in Volume: 0% alization: Small (1-33%) ng: No ning: No Electronic Signature(s) Signed: 01/24/2020 5:18:21 PM By: Kevin Abts RN, BSN Entered By: [name] on 01/20/2020 14:56:20 -------------------------------------------------------------------------------- Vitals Details Patient Name: Date of Service: [name] 01/20/2020 1:45 PM Medical Record [id number] Patient Account Number: [id number] Date of Birth/Sex: Treating RN: 1951-08-01 (69 y.o. [name] Primary Care [name]: [name] Other Clinician: Referring [name]: Treating [name]/Extender:[name], [name], [name] in Treatment: 208 Vital Signs Time Taken: 14:55 Temperature (F): 98.3 Height (in): 70 Pulse (bpm): 54 Weight (lbs): 380.2 Respiratory Rate (breaths/min): 20 Body Mass Index (BMI): 54.5 Blood Pressure (mmHg): 168/57 Reference Range: 80 - 120 mg / dl Electronic Signature(s) Signed: 01/24/2020 5:18:21 PM By: Kevin Abts RN, BSN Entered By: [name] on 01/20/2020 14:55:26

## 2020-01-24 NOTE — Progress Notes (Signed)
[name], [name] ([id number]) Visit Report for 01/20/2020 SuperBill Details Patient Name: Date of Service: [name], Kevin 01/20/2020 Medical Record [id number] Patient Account Number: [id number] Date of Birth/Sex: Treating RN: [birth year] (69 y.o. [name] Primary Care Provider: Nicoletta Ba Other Clinician: Referring Provider: Treating Provider/Extender:Micaiah Litle, Lottie Rater, [name] in Treatment: 208 Diagnosis Coding ICD-10 Codes Code Description 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 skin L97.221 Non-pressure chronic ulcer of left calf limited to breakdown of skin 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 of bilateral lower extremity I89.0 Lymphedema, not elsewhere classified E11.622 Type 2 diabetes mellitus with other skin ulcer 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, unspecified L03.116 Cellulitis of left lower limb Facility Procedures CPT4 Code Description Modifier Quantity [phone] (Facility Use Only) [phone] - APPLY MULTLAY COMPRS LWR LT LEG 1 Electronic Signature(s) Signed: 01/20/2020 7:02:26 PM By: Baltazar Najjar MD Signed: 01/24/2020 5:18:21 PM By: [name] RN, BSN Entered By: [name] on 01/20/2020 14:58:41

## 2020-01-26 ENCOUNTER — Encounter (HOSPITAL_BASED_OUTPATIENT_CLINIC_OR_DEPARTMENT_OTHER): Payer: Medicare Other | Admitting: Physician Assistant

## 2020-01-26 ENCOUNTER — Other Ambulatory Visit: Payer: Self-pay | Admitting: Family Medicine

## 2020-01-26 ENCOUNTER — Other Ambulatory Visit: Payer: Self-pay

## 2020-01-26 DIAGNOSIS — I10 Essential (primary) hypertension: Secondary | ICD-10-CM | POA: Diagnosis not present

## 2020-01-26 DIAGNOSIS — E1140 Type 2 diabetes mellitus with diabetic neuropathy, unspecified: Secondary | ICD-10-CM | POA: Diagnosis not present

## 2020-01-26 DIAGNOSIS — I87333 Chronic venous hypertension (idiopathic) with ulcer and inflammation of bilateral lower extremity: Secondary | ICD-10-CM | POA: Diagnosis not present

## 2020-01-26 DIAGNOSIS — L03116 Cellulitis of left lower limb: Secondary | ICD-10-CM | POA: Diagnosis not present

## 2020-01-26 DIAGNOSIS — E11621 Type 2 diabetes mellitus with foot ulcer: Secondary | ICD-10-CM | POA: Diagnosis not present

## 2020-01-26 DIAGNOSIS — I890 Lymphedema, not elsewhere classified: Secondary | ICD-10-CM | POA: Diagnosis not present

## 2020-01-26 DIAGNOSIS — L97221 Non-pressure chronic ulcer of left calf limited to breakdown of skin: Secondary | ICD-10-CM | POA: Diagnosis not present

## 2020-01-26 DIAGNOSIS — L97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 right calf limited to breakdown of skin: Secondary | ICD-10-CM | POA: Diagnosis not present

## 2020-01-26 DIAGNOSIS — E11622 Type 2 diabetes mellitus with other skin ulcer: Secondary | ICD-10-CM | POA: Diagnosis not present

## 2020-01-26 DIAGNOSIS — M109 Gout, unspecified: Secondary | ICD-10-CM | POA: Diagnosis not present

## 2020-01-26 NOTE — Progress Notes (Addendum)
Kevin, [name] ([id number]) Visit Report for 01/26/2020 Chief Complaint Document Details Patient Name: Date of Service: [name], [name] 01/26/2020 10:30 AM Medical Record [id number] Patient Account Number: [id number] Date of Birth/Sex: Treating RN: 10-24-1951 (69 y.o. [name] Primary Care Provider: Nicoletta [name] Other Clinician: Referring Provider: Treating Provider/Extender:[name], [name] in Treatment: 209 Information Obtained from: Patient Chief Complaint patient is here for evaluation venous/lymphedema weeping Electronic Signature(s) Signed: 01/26/2020 10:26:23 AM By: Lenda Kelp PA-C Entered By: Lenda Kelp on 01/26/2020 10:26:22 -------------------------------------------------------------------------------- HPI Details Patient Name: Date of Service: [name], [name] 01/26/2020 10:30 AM Medical Record [id number] Patient Account Number: [id number] Date of Birth/Sex: Treating RN: 1951/07/09 (69 y.o. [name] Primary Care Provider: Nicoletta [name] Other Clinician: Referring Provider: Treating Provider/Extender:[name], [name] in Treatment: 209 History of Present Illness HPI Description: Referred by PCP for consultation. Patient has long standing history of BLE venous stasis, no prior ulcerations. At beginning of month, developed cellulitis and weeping. Received IM Rocephin followed by Keflex and resolved. Wears compression stocking, appr 6 months old. Not sure strength. No present drainage. 01/22/16 this is a patient who is a type II diabetic on insulin. 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 He tells me he religiously wears pressure stockings of uncertain strength. He was here with weeping edema about 8 months ago but did not have an open wound. Roughly a month ago he had a reopening on his bilateral legs. He is been using bandages and Neosporin. He does not  complain of pain. 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 Last BUN/creatinine I have is from 11/20/15 at 13 and 1.0 respectively 01/29/16; patient arrives today having tolerated the Profore wrap. He brought in his stockings and these are 18 mmHg stockings he bought from [place]. The compression here is likely inadequate. He does not complain of pain or excessive drainage she has no systemic symptoms. The wound on the right looks improved as does the one on the left although one on the left is more substantial with still tissue at risk below the actual wound area on the bilateral posterior calf 02/05/16; patient arrives with poor edema control. He states that we did put a 4 layer compression on it last week. No weight appear 5 this. 02/12/16; the area on the posterior right Has healed. The left Has a substantial wound that has necrotic surface eschar that requires a debridement with a curette. 02/16/16;the patient called or a Nurse visit secondary to increased swelling. He had been in earlier in the week with his right leg healed. He was transitioned to is on pressure stocking on the right leg with the only open wound on the left, a substantial area on the left posterior calf. Note he has a history of severe lower extremity edema, he has a history of chronic atrial fibrillation but not heart failure per my notes but I'll need to research this. He is not complaining of chest pain shortness of breath or orthopnea. The intake nurse noted blisters on the previously closed right leg 02/19/16; this is the patient's regular visit day. I see him on Friday with escalating edema new wounds on the right leg and clear signs of at least right ventricular heart failure. I increased his Lasix to 40 twice a day. He is returning currently in follow-up. States he  is noticed a decrease in that the edema 02/26/16  patient's legs have much less edema. There is nothing really open on the right leg. The left leg has improved condition of the large superficial wound on the posterior left leg 03/04/16; edema control is very much better. The patient's right leg wounds have healed. On the left leg he continues to have severe venous inflammation on the posterior aspect of the left leg. There is no tenderness and I don't think any of this is cellulitis. 03/11/16; patient's right leg is married healed and he is in his own stocking. The patient's left leg has deteriorated somewhat. There is a lot of erythema around the wound on the posterior left leg. There is also a significant rim of erythema posteriorly just above where the wrap would've ended there is a new wound in this location and a lot of tenderness. Can't rule out cellulitis in this area. 03/15/16; patient's right leg remains healed and he is in his own stocking. The patient's left leg is much better than last review. His major wound on the posterior aspect of his left Is almost fully epithelialized. He has 3 small injuries from the wraps. Really. Erythema seems a lot better on antibiotics 03/18/16; right leg remains healed and he is in his own stocking. The patient's left leg is much better. The area on the posterior aspect of the left calf is fully epithelialized. His 3 small injuries which were wrap injuries on the left are improved only one seems still open his erythema has resolved 03/25/16; patient's right leg remains healed and he is in his own stocking. There is no open area today on the left leg posterior leg is completely closed up. His wrap injuries at the superior aspect of his leg are also resolved. He looks as though he has some irritation on the dorsal ankle but this is fully epithelialized without evidence of infection. 03/28/16; we discharged this patient on Monday. Transitioned him into his own stocking. There were problems almost immediately with  uncontrolled swelling weeping edema multiple some of which have opened. He does not feel systemically unwell in particular no chest pain no shortness of breath and he does not feel 04/08/16; the edema is under better control with the Profore light wrap but he still has pitting edema. There is one large wound anteriorly 2 on the medial aspect of his left leg and 3 small areas on the superior posterior calf. Drainage is not excessive he is tolerating a Profore light well 04/15/16; put a Profore wrap on him last week. This is controlled is edema however he had a lot of pain on his left anterior foot most of his wounds are healed 04/22/16 once again the patient has denuded areas on the left anterior foot which he states are because his wrap slips up word. He saw his primary physician today is on Lasix 40 twice a day and states that he his weight is down 20 pounds over the last 3 months. 04/29/16: Much improved. left anterior foot much improved. He is now on Lasix 80 mg per day. Much improved edema control 05/06/16; I was hoping to be able to discharge him today however once again he has blisters at a low level of where the compression was placed last week mostly on his left lateral but also his left medial leg and a small area on the anterior part of the left foot. 05/09/16; apparently the patient went home after his appointment on 7/4 later in  the evening developing pain in his upper medial thigh together with subjective fever and chills although his temperature was not taken. The pain was so intense he felt he would probably have to call 911. However he then remembered that he had leftover doxycycline from a previous round of antibiotics and took these. By the next morning he felt a lot better. He called and spoke to one of our nurses and I approved doxycycline over the phone thinking that this was in relation to the wounds we had previously seen although they were definitely were not. The patient feels a  lot better old fever no chills he is still working. Blood sugars are reasonably controlled 05/13/16; patient is back in for review of his cellulitis on his anterior medial upper thigh. He is taking doxycycline this is a lot better. Culture I did of the nodular area on the dorsal aspect of his foot grew MRSA this also looks a lot better. 05/20/16; the patient is cellulitis on the medial upper thigh has resolved. All of his wound areas including the left anterior foot, areas on the medial aspect of the left calf and the lateral aspect of the calf at all resolved. He has a new blister on the left dorsal foot at the level of the fourth toe this was excised. No evidence of infection 05/27/16; patient continues to complain weeping edema. He has new blisterlike wounds on the left anterior lateral and posterior lateral calf at the top of his wrap levels. The area on his left anterior foot appears better. He is not complaining of fever, pain or pruritus in his feet. 05/30/16; the patient's blisters on his left anterior leg posterior calf all look improved. He did not increase the Lasix 100 mg as I suggested because he was going to run out of his 40 mg tablets. He is still having weeping edema of his toes 06/03/16; I renewed his Lasix at 80 mg once a day as he was about to run out when I last saw him. He is on 80 mg of Lasix now. I have asked him to cut down on the excessive amount of water he was drinking and asked him to drink according to his thirst mechanisms 06/12/2016 -- was seen 2 days ago and was supposed to wear his compression stockings at home but he is developed lymphedema and superficial blisters on the left lower extremity and hence came in for a review 06/24/16; the remaining wound is on his left anterior leg. He still has edema coming from between his toes. There is lymphedema here however his edema is generally better than when I last saw this. He has a history of atrial fibrillation but does  not have a known history of congestive heart failure nevertheless I think he probably has this at least on a diastolic basis. 07/01/16 I reviewed his echocardiogram from January 2017. This was essentially normal. He did not have LVH, EF of 55-60%. His right ventricular function was normal although he did have trivial tricuspid and pulmonic regurgitation. This is not audible on exam however. I increased his Lasix to do massive edema in his legs well above his knees I think in early July. He was also drinking an excessive amount of water at the time. 07/15/16; missed his appointment last week because of the Labor Day holiday on Monday. He could not get another appointment later in the week. Started to feel the wrap digging in superiorly so we remove the top half and the bottom  half of his wrap. He has extensive erythema and blistering superiorly in the left leg. Very tender. Very swollen. Edema in his foot with leaking edema fluid. He has not been systemically unwell 07/22/16; the area on the left leg laterally required some debridement. The medial wounds look more stable. His wrap injury wounds appear to have healed. Edema and his foot is better, weeping edema is also better. He tells me he is meeting with the supplier of the external compression pumps at work 08/05/16; the patient was on vacation last week in [facility]. His wrap is been on for an extended period of time. Also over the weekend he developed an extensive area of tender erythema across his anterior medial thigh. He took to doxycycline yesterday that he had leftover from a previous prescription. The patient complains of weeping edema coming out of his toes 08/08/16; I saw this patient on 10/2. He was tender across his anterior thigh. I put him on doxycycline. He returns today in follow-up. He does not have any open wounds on his lower leg, he still has edema weeping into his toes. 08/12/16; patient was seen back urgently today to  follow-up for his extensive left thigh cellulitis/erysipelas. He comes back with a lot less swelling and erythema pain is much better. I believe I gave him Augmentin and Cipro. His wrap was cut down as he stated a roll down his legs. He developed blistering above the level of the wrap that remained. He has 2 open blisters and 1 intact. 08/19/16; patient is been doing his primary doctor who is increased his Lasix from 40-80 once a day or 80 already has less edema. Cellulitis has remained improved in the left thigh. 2 open areas on the posterior left calf 08/26/16; he returns today having new open blisters on the anterior part of his left leg. He has his compression pumps but is not yet been shown how to use some vital representative from the supplier. 09/02/16 patient returns today with no open wounds on the left leg. Some maceration in his plantar toes 09/10/2016 -- Dr. Leanord Hawking had recently discharged him on 09/02/2016 and he has come right back with redness swelling and some open ulcers on his left lower extremity. He says this was caused by trying to apply his compression stockings and he's been unable to use this and has not been able to use his lymphedema pumps. He had some doxycycline leftover and he has started on this a few days ago. 09/16/16; there are no open wounds on his leg on the left and no evidence of cellulitis. 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 He does not complain of symptoms here. I am not clear use using his external compression pumps. 09/23/16; I have not seen this patient in 2 [name]. He canceled his appointment 10 days ago as he was going on vacation. He tells me that on Monday he noticed a large area on his posterior left leg which is been draining copiously and is reopened into a large wound. He is been using ABDs and the external part of his juxtalite, according to our nurse this was not on  properly. 10/07/16; Still a substantial area on the posterior left leg. Using silver alginate 10/14/16; in general better although there is still open area which looks healthy. Still using silver alginate. He reminds me that this happen before he left for [facility]. Today while he was showering in the morning.  He had been using his juxtalite's 10/21/16; the area on his posterior left leg is fully epithelialized. However he arrives today with a large area of tender erythema in his medial and posterior left thigh just above the knee. I have marked the area. Once again he is reluctant to consider hospitalization. I treated him with oral antibiotics in the past for a similar situation with resolution I think with doxycycline however this area it seems more extensive to me. He is not complaining of fever but does have chills and says states he is thirsty. His blood sugar today was in the 140s at home 10/25/16 the area on his posterior left leg is fully epithelialized although there is still some weeping edema. The large area of tenderness and erythema in his medial and posterior left thigh is a lot less tender although there is still a lot of swelling in this thigh. He states he feels a lot better. He is on doxycycline and Augmentin that I started last week. This will continued until Tuesday, December 26. I have ordered a duplex ultrasound of the left thigh rule out DVT whether there is an abscess something that would need to be drained I would also like to know. 11/01/16; he still has weeping edema from a not fully epithelialized area on his left posterior calf. Most of the rest of this looks a lot better. He has completed his antibiotics. His thigh is a lot better. Duplex ultrasound did not show a DVT in the thigh 11/08/16; he comes in today with more Denuded surface epithelium from the posterior aspect of his calf. There is no real evidence of cellulitis. The superior aspect of his wrap appears to  have put quite an indentation in his leg just below the knee and this may have contributed. He does not complain of pain or fever. We have been using silver alginate as the primary dressing. The area of cellulitis in the right thigh has totally resolved. He has been using his compression stockings once a week 11/15/16; the patient arrives today with more loss of epithelium from the posterior aspect of his left calf. He now has a fairly substantial wound in this area. The reason behind this deterioration isn't exactly clear although his edema is not well controlled. He states he feels he is generally more swollen systemically. He is not complaining of chest pain shortness of breath fever. Tells me he has an appointment with his primary physician in early February. He is on 80 mg of oral Lasix a day. He claims compliance with the external compression pumps. He is not having any pain in his legs similar to what he has with his recurrent cellulitis 11/22/16; the patient arrives a follow-up of his large area on his left lateral calf. This looks somewhat better today. He came in earlier in the week for a dressing change since I saw him a week ago. He is not complaining of any pain no shortness of breath no chest pain 11/28/16; the patient arrives for follow-up of his large area on the left lateral calf this does not look better. In fact it is larger weeping edema. The surface of the wound does not look too bad. We have been using silver alginate although I'm not certain that this is a dressing issue. 12/05/16; again the patient follows up for a large wound on the left lateral and left posterior calf this does not look better. There continues to be weeping edema necrotic surface tissue. More worrisome than  this once again there is erythema below the wound involving the distal Achilles and heel suggestive of cellulitis. He is on his feet working most of the day of this is not going well. We are changing his  dressing twice a week to facilitate the drainage. 12/12/16; not much change in the overall dimensions of the large area on the left posterior calf. This is very inflamed looking. I gave him an. Doxycycline last week does not really seem to have helped. He found the wrap very painful indeed it seems to of dog into his legs superiorly and perhaps around the heel. He came in early today because the drainage had soaked through his dressings. 12/19/16- patient arrives for follow-up evaluation of his left lower extremity ulcers. He states that he is using his lymphedema pumps once daily when there is "no drainage". He admits to not using his lipedema pumps while under current treatment. His blood sugars have been consistently between 150-200. 12/26/16; the patient is not using his compression pumps at home because of the wetness on his feet. I've advised him that I think it's important for him to use this daily. He finds his feet too wet, he can put a plastic bag over his legs while he is in the pumps. Otherwise I think will be in a vicious circle. We are using silver alginate to the major area on his left posterior calf 01/02/17; the patient's posterior left leg has further of all into 3 open wounds. All of them covered with a necrotic surface. He claims to be using his compression pumps once a day. His edema control is marginal. Continue with silver alginate 01/10/17; the patient's left posterior leg actually looks somewhat better. There is less edema, less erythema. Still has 3 open areas covered with a necrotic surface requiring debridement. He claims to be using his compression pumps once a day his edema control is better 01/17/17; the patient's left posterior calf look better last week when I saw him and his wrap was changed 2 days ago. He has noted increasing pain in the left heel and arrives today with much larger wounds extensive erythema extending down into the entire heel area especially tender  medially. He is not systemically unwell CBGs have been controlled no fever. Our intake nurse showed me limegreen drainage on his AVD pads. 01/24/17; his usual this patient responds nicely to antibiotics last week giving him Levaquin for presumed Pseudomonas. The whole entire posterior part of his leg is much better much less inflamed and in the case of his Achilles heel area much less tender. He has also had some epithelialization posteriorly there are still open areas here and still draining but overall considerably better 01/31/17- He has continue to tolerate the compression wraps. he states that he continues to use the lymphedema pumps daily, and can increase to twice daily on the weekends. He is voicing no complaints or concerns regarding his LLE ulcers 02/07/17-he is here for follow-up evaluation. He states that he noted some erythema to the left medial and anterior thigh, which he states is new as of yesterday. He is concerned about recurrent cellulitis. He states his blood sugars have been slightly elevated, this morning in the 180s 02/14/17; he is here for follow-up evaluation. When he was last here there was erythema superiorly from his posterior wound in his anterior thigh. He was prescribed Levaquin however a culture of the wound surface grew MRSA over the phone I changed him to doxycycline on Monday and things  seem to be a lot better. 02/24/17; patient missed his appointment on Friday therefore we changed his nurse visit into a physician visit today. Still using silver alginate on the large area of the posterior left thigh. He isn't new area on the dorsal left second toe 03/03/17; actually better today although he admits he has not used his external compression pumps in the last 2 days or so because of work responsibilities over the weekend. 03/10/17; continued improvement. External compression pumps once a day almost all of his wounds have closed on the posterior left calf. Better edema  control 03/17/17; in general improved. He still has 3 small open areas on the lateral aspect of his left leg however most of the area on the posterior part of his leg is epithelialized. He has better edema control. He has an ABD pad under his stocking on the right anterior lower leg although he did not let us look at that today. 03/24/17; patient arrives back in clinic today with no open areas however there are areas on the posterior left calf and anterior left calf that are less than 100% epithelialized. His edema is well controlled in the left lower leg. There is some pitting edema probably lymphedema in the left upper thigh. He uses compression pumps at home once per day. I tried to get him to do this twice a day although he is very reticent. 04/01/2017 -- for the last 2 days he's had significant redness, tenderness and weeping and came in for an urgent visit today. 04/07/17; patient still has 6 more days of doxycycline. He was seen by Dr. Con Memos last Wednesday for cellulitis involving the posterior aspect, lateral aspect of his Involving his heel. For the most part he is better there is less erythema and less weeping. He has been on his feet for 12 hours 2 over the weekend. Using his compression pumps once a day 04/14/17 arrives today with continued improvement. Only one area on the posterior left calf that is not fully epithelialized. He has intense bilateral venous inflammation associated with his chronic venous insufficiency disease and secondary lymphedema. We have been using silver alginate to the left posterior calf wound In passing he tells [place] today that the right leg but we have not seen in quite some time has an open area on it but he doesn't want [place] to look at this today states he will show this to [place] next week. 04/21/17; there is no open area on his left leg although he still reports some weeping edema. He showed [place] his right leg today which is the first time we've seen this leg in a  long time. He has a large area of open wound on the right leg anteriorly healthy granulation. Quite a bit of swelling in the right leg and some degree of venous inflammation. He told us about the right leg in passing last week but states that deterioration in the right leg really only happened over the weekend 04/28/17; there is no open area on the left leg although there is an irritated part on the posterior which is like a wrap injury. The wound on the right leg which was new from last week at least to [place] is a lot better. 05/05/17; still no open area on the left leg. Patient is using his new compression stocking which seems to be doing a good job of controlling the edema. He states he is using his compression pumps once per day. The right leg still has an  open wound although it is better in terms of surface area. Required debridement. A lot of pain in the posterior right Achilles marked tenderness. Usually this type of presentation this patient gives concern for an active cellulitis 05/12/17; patient arrives today with his major wound from last week on the right lateral leg somewhat better. Still requiring debridement. He was using his compression stocking on the left leg however that is reopened with superficial wounds anteriorly he did not have an open wound on this leg previously. He is still using his juxta light's once daily at night. He cannot find the time to do this in the morning as he has to be at work by 7 AM 05/19/17; right lateral leg wound looks improved. No debridement required. The concerning area is on the left posterior leg which appears to almost have a subcutaneous hemorrhagic component to it. We've been using silver alginate to all the wounds 05/26/17; the right lateral leg wound continues to look improved. However the area on the left posterior calf is a tightly adherent surface. [place] using silver alginate. Because of the weeping edema in his legs there is very little  good alternatives. 06/02/17; the patient left here last week looking quite good. Major wound on the left posterior calf and a small one on the right lateral calf. Both of these look satisfactory. He tells me that by Wednesday he had noted increased pain in the left leg and drainage. He called on Thursday and Friday to get an appointment here but we were blocked. He did not go to urgent care or his primary physician. He thinks he had a fever on Thursday but did not actually take his temperature. He has not been using his compression pumps on the left leg because of pain. I advised him to go to the emergency room today for IV antibiotics for stents of left leg cellulitis but he has refused I have asked him to take 2 days off work to keep his leg elevated and he has refused this as well. In view of this I'm going to call him and Augmentin and doxycycline. 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 However after a detailed discussion regarding the risk benefits and alternatives he refuses to get admitted to the hospital. With no other recourse I will continue him on oral antibiotics as before and hopefully he'll have his infectious disease consultation this week. 06/16/2017 -- the patient was seen today by the nurse practitioner at infectious disease Ms. Dixon. 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 He has also been advise Lotrimin cream locally. she also had wise IV antibiotics for his condition if it flares up 06/23/17; patient arrives today with drainage bilaterally although the  remaining wound on the left posterior calf after cleaning up today "highlighter yellow drainage" did not look too bad. Unfortunately he has had breakdown on the right anterior leg [previously this leg had not been open and he is using a black stocking] he went to see infectious disease and is been put on clindamycin 150 mg daily, I  did not verify the dose although I'm not familiar with using clindamycin in this dosing range, perhaps for prophylaxisoo 06/27/17; I brought this patient back today to follow-up on the wound deterioration on the right lower leg together with surrounding cellulitis. I started him on doxycycline 4 days ago. This area looks better however he comes in today with intense cellulitis on the medial part of his left thigh. This is not have a wound in this area. Extremely tender. We've been using silver alginate to the wounds on the right lower leg left lower leg with bilateral 4 layer compression he is using his external compression pumps once a day 07/04/17; patient's left medial thigh cellulitis looks better. He has not been using his compression pumps as his insert said it was contraindicated with cellulitis. His right leg continues to make improvements all the wounds are still open. We only have one remaining wound on the left posterior calf. Using silver alginate to all open areas. He is on doxycycline which I started a week ago and should be finishing I gave him Augmentin after Thursday's visit for the severe cellulitis on the left medial thigh which fortunately looks better 07/14/17; the patient's left medial thigh cellulitis has resolved. The cellulitis in his right lower calf on the right also looks better. All of his wounds are stable to improved we've been using silver alginate he has completed the antibiotics I have given him. He has clindamycin 150 mg once a day prescribed by infectious disease for prophylaxis, I've advised him to start this now. We have been using  bilateral Unna boots over silver alginate to the wound areas 07/21/17; the patient is been to see infectious disease who noted his recurrent problems with cellulitis. He was not able to tolerate prophylactic clindamycin therefore he is on amoxicillin 500 twice a day. He also had a second daily dose of Lasix added By Dr. Oneta RackMcKeown but he is not taking this. Nor is he being completely compliant with his compression pumps a especially not this week. He has 2 remaining wounds one on the right posterior lateral lower leg and one on the left posterior medial lower leg. 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 The patient has Unna boots bilaterally. 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 He has bilateral Unna boots for compression and silver alginate to his wounds. Arrives today with his legs looking as good as I have seen him in quite some time. Not surprisingly his wounds look better as well with improvement on the right lateral leg venous insufficiency wound and also the left medial leg. He is still using the compression pumps once a day 08/11/17; both legs appear to be doing better wounds on the right lateral and left medial legs look better. Skin on the right leg quite good. He is been using silver alginate as the primary dressing. I'm going to use Anasept gel calcium alginate and maintain all the secondary dressings 08/18/17; the patient continues to actually do quite well. The area on his right lateral leg is just about closed the left medial also looks better although it is still moist in this area. His edema is well controlled we have been using Anasept gel with calcium alginate and the usual secondary dressings, 4 layer compression and once daily  use of his compression  pumps "always been able to manage 09/01/17; the patient continues to do reasonably well in spite of his trip to [place]. The area on the right lateral leg is epithelialized. Left is much better but still open. He has more edema and more chronic erythema on the left leg [venous inflammation] 09/08/17; he arrives today with no open wound on the right lateral leg and decently controlled edema. Unfortunately his left leg is not nearly as in his good situation as last week.he apparently had increasing edema starting on Saturday. He edema soaked through into his foot so used a plastic bag to walk around his home. The area on the medial right leg which was his open area is about the same however he has lost surface epithelium on the left lateral which is new and he has significant pain in the Achilles area of the left foot. He is already on amoxicillin chronically for prophylaxis of cellulitis in the left leg 09/15/17; he is completed a week of doxycycline and the cellulitis in the left posterior leg and Achilles area is as usual improved. He still has a lot of edema and fluid soaking through his dressings. There is no open wound on the right leg. He saw infectious disease NP today 09/22/17;As usual 1 we transition him from our compression wraps to his stockings things did not go well. He has several small open areas on the right leg. He states this was caused by the compression wrap on his skin although he did not wear this with the stockings over them. He has several superficial areas on the left leg medially laterally posteriorly. He does not have any evidence of active cellulitis especially involving the left Achilles The patient is traveling from [facility] Saturday going to [facility]. He states he isn't attempting to get an appointment with a heel objects wound center there to change his dressings. I am not completely certain whether this will work 10/06/17; the patient came in on Friday for a  nurse visit and the nurse reported that his legs actually look quite good. He arrives in clinic today for his regular follow-up visit. He has a new wound on his left third toe over the PIP probably caused by friction with his footwear. He has small areas on the left leg and a very superficial but epithelialized area on the right anterior lateral lower leg. Other than that his legs look as good as I've seen him in quite some time. We have been using silver alginate Review of systems; no chest pain no shortness of breath other than this a 10 point review of systems negative 10/20/17; seen by Dr. Meyer Russel last week. He had taken some antibiotics [doxycycline] that he had left over. Dr. Meyer Russel thought he had candida infection and declined to give him further antibiotics. He has a small wound remaining on the right lateral leg several areas on the left leg including a larger area on the left posterior several left medial and anterior and a small wound on the left lateral. The area on the left dorsal third toe looks a lot better. ROS; Gen.; no fever, respiratory no cough no sputum Cardiac no chest pain other than this 10 point review of system is negative 10/30/17; patient arrives today having fallen in the bathtub 3 days ago. It took him a while to get up. He has pain and maceration in the wounds on his left leg which have deteriorated. He has not been using his pumps  he also has some maceration on the right lateral leg. 11/03/17; patient continues to have weeping edema especially in the left leg. This saturates his dressings which were just put on on 12/27. As usual the doxycycline seems to take care of the cellulitis on his lower leg. He is not complaining of fever, chills, or other systemic symptoms. He states his leg feels a lot better on the doxycycline I gave him empirically. He also apparently gets injections at his primary doctor's officeo Rocephin for cellulitis prophylaxis. I didn't ask him  about his compression pump compliance today I think that's probably marginal. Arrives in the clinic with all of his dressings primary and secondary macerated full of fluid and he has bilateral edema 11/10/17; the patient's right leg looks some better although there is still a cluster of wounds on the right lateral. The left leg is inflamed with almost circumferential skin loss medially to laterally although we are still maintaining anteriorly. He does not have overt cellulitis there is a lot of drainage. He is not using compression pumps. We have been using silver alginate to the wound areas, there are not a lot of options here 11/17/17; the patient's right leg continues to be stable although there is still open wounds, better than last week. The inflammation in the left leg is better. Still loss of surface layer epithelium especially posteriorly. There is no overt cellulitis in the amount of edema and his left leg is really quite good, tells me he is using his compression pumps once a day. 11/24/17; patient's right leg has a small superficial wound laterally this continues to improve. The inflammation in the left leg is still improving however we have continuous surface layer epithelial loss posteriorly. There is no overt cellulitis in the amount of edema in both legs is really quite good. He states he is using his compression pumps on the left leg once a day for 5 out of 7 days 12/01/17; very small superficial areas on the right lateral leg continue to improve. Edema control in both legs is better today. He has continued loss of surface epithelialization and left posterior calf although I think this is better. We have been using silver alginate with large number of absorptive secondary dressings 4 layer on the left Unna boot on the right at his request. He tells me he is using his compression pumps once a day 12/08/17; he has no open area on the right leg is edema control is good here. On the left leg  however he has marked erythema and tenderness breakdown of skin. He has what appears to be a wrap injury just distal to the popliteal fossa. 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 There was swelling erythema and breakdown in the right leg. He has much worse skin breakdown in the left leg as well multiple open areas medially and posteriorly as well as laterally. He tells me he has been using his compression pumps but tells me he feels that the drainage out of his leg is worse when he uses a compression pumps. To be fair to him he is been saying this for a while however I don't know that I have really been listening to this. I wonder if  the compression pumps are working properly 12/22/17;. Once again he arrives with severe erythema, weeping edema from the left greater than right leg. Noncompliance with compression pumps. New this visit he is complaining of pain on the lateral aspect of the right leg and the medial aspect of his right thigh. He apparently saw his cardiologist Dr. Debara Pickett who was ordered an echocardiogram area and I think this is a step in the right direction 12/25/17; started his doxycycline Monday night. There is still intense erythema of the right leg especially in the anterior thigh although there is less tenderness. The erythema around the wound on the right lateral calf also is less tender. He still complaining of pain in the left heel. His wounds are about the same right lateral left medial left lateral. Superficial but certainly not close to closure. He denies being systemically unwell no fever chills no abdominal pain no diarrhea 12/29/17; back in follow-up of his extensive right calf and right thigh  cellulitis. I added amoxicillin to cover possible doxycycline resistant strep. This seems to of done the trick he is in much less pain there is much less erythema and swelling. He has his echocardiogram at 11:00 this morning. X-ray of the left heel was also negative. 01/05/18; the patient arrived with his edema under much better control. Now that he is retired he is able to use his compression pumps daily and sometimes twice a day per the patient. He has a wound on the right leg the lateral wound looks better. Area on the left leg also looks a lot better. He has no evidence of cellulitis in his bilateral thighs I had a quick peak at his echocardiogram. He is in normal ejection fraction and normal left ventricular function. He has moderate pulmonary hypertension moderately reduced right ventricular function. One would have to wonder about chronic sleep apnea although he says he doesn't snore. He'll review the echocardiogram with his cardiologist. 01/12/18; the patient arrives with the edema in both legs under exemplary control. He is using his compression pumps daily and sometimes twice daily. His wound on the right lateral leg is just about closed. He still has some weeping areas on the posterior left calf and lateral left calf although everything is just about closed here as well. I have spoken with Jeri Lager who is the patient's nurse practitioner and infectious disease. She was concerned that the patient had not understood that the parenteral penicillin injections he was receiving for cellulitis prophylaxis was actually benefiting him. I don't think the patient actually saw that I would tend to agree we were certainly dealing with less infections although he had a serious one last month. 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 He is healed. He'll be placed in juxtalite compression wraps and increase his lymphedema pumps to twice daily. We will follow up  again next week to ensure there are no issues with the new regiment. 01/20/18-he is here for evaluation of bilateral lower extremity weeping edema. Yesterday he was placed in compression wrap to the right lower extremity and compression stocking to left lower shrubbery. He states he uses lymphedema pumps last night and again this morning and noted a blister to the left lower extremity. On exam he was noted to have drainage to the right lower extremity. He will be placed in Unna boots bilaterally and follow-up next week 01/26/18; patient was actually discharged a week ago to his own juxta light stockings only to return the next  day with bilateral lower extremity weeping edema.he was placed in bilateral Unna boots. He arrives today with pain in the back of his left leg. There is no open area on the right leg however there is a linear/wrap injury on the left leg and weeping edema on the left leg posteriorly. I spoke with infectious disease about 10 days ago. They were disappointed that the patient elected to discontinue prophylactic intramuscular penicillin shots as they felt it was particularly beneficial in reducing the frequency of his cellulitis. I discussed this with the patient today. He does not share this view. He'll definitely need antibiotics today. Finally he is traveling to [place] and trauma leaving this Saturday and returning a week later and he does not travel with his pumps. He is going by car 01/30/18; patient was seen 4 days ago and brought back in today for review of cellulitis in the left leg posteriorly. I put him on amoxicillin this really hasn't helped as much as I might like. He is also worried because he is traveling to [facility] trauma by car. Finally we will be rewrapping him. There is no open area on the right leg over his left leg has multiple weeping areas as usual 02/09/18; The same wrap on for 10 days. He did not pick up the last doxycycline I prescribed for him. He  apparently took 4 days worth he already had. There is nothing open on his right leg and the edema control is really quite good. He's had damage in the left leg medially and laterally especially probably related to the prolonged use of Unna boots 02/12/18; the patient arrived in clinic today for a nurse visit/wrap change. He complained of a lot of pain in the left posterior calf. He is taking doxycycline that I previously prescribed for him. Unfortunately even though he used his stockings and apparently used to compression pumps twice a day he has weeping edema coming out of the lateral part of his right leg. This is coming from the lower anterior lateral skin area. 02/16/18; the patient has finished his doxycycline and will finish the amoxicillin 2 days. The area of cellulitis in the left calf posteriorly has resolved. He is no longer having any pain. He tells me he is using his compression pumps at least once a day sometimes twice. 02/23/18; the patient finished his doxycycline and Amoxil last week. On Friday he noticed a small erythematous circle about the size of a quarter on the left lower leg just above his ankle. This rapidly expanded and he now has erythema on the lateral and posterior part of the thigh. This is bright red. Also has an area on the dorsal foot just above his toes and a tender area just below the left popliteal fossa. He came off his prophylactic penicillin injections at his own insistence one or 2 months ago. This is obviously deteriorated since then 03/02/18; patient is on doxycycline and Amoxil. Culture I did last week of the weeping area on the back of his left calf grew group B strep. I have therefore renewed the amoxicillin 500 3 times a day for a further week. He has not been systemically unwell. Still complaining of an area of discomfort right under his left popliteal fossa. There is no open wound on the right leg. He tells me that he is using his pumps twice a day on  most days 03/09/18; patient arrives in clinic today completing his amoxicillin today. The cellulitis on his left leg is better. Furthermore  he tells me that he had intramuscular penicillin shots that his primary care office today. However he also states that the wrap on his right leg fell down shortly after leaving clinic last week. He developed a large blister that was present when he came in for a nurse visit later in the week and then he developed intense discomfort around this area.He tells me he is using his compression pumps 03/16/18; the patient has completed his doxycycline. The infectious part of this/cellulitis in the left heel area left popliteal area is a lot better. He has 2 open areas on the right calf. Still areas on the left calf but this is a lot better as well. 03/24/18; the patient arrives complaining of pain in the left popliteal area again. He thinks some of this is wrap injury. He has no open area on the right leg and really no open area on the left calf either except for the popliteal area. He claims to be compliant with the compression pumps 03/31/18; I gave him doxycycline last week because of cellulitis in the left popliteal area. This is a lot better although the surface epithelium is denuded off and response to this. He arrives today with uncontrolled edema in the right calf area as well as a fingernail injury in the right lateral calf. There is only a few open areas on the left 04/06/18; I gave him amoxicillin doxycycline over the last 2 [name] that the amoxicillin should be completing currently. He is not complaining of any pain or systemic symptoms. The only open areas see has is on the right lateral lower leg paradoxically I cannot see anything on the left lower leg. He tells me he is using his compression pumps twice a day on most days. Silver alginate to the wounds that are open under 4 layer compression 04/13/18; he completed antibiotics and has no new complaints. Using  his compression pumps. Silver alginate that anything that's opened 04/20/18; he is using his compression pumps religiously. Silver alginate 4 layer compression anything that's opened. He comes in today with no open wounds on the left leg but 3 on the right including a new one posteriorly. He has 2 on the right lateral and one on the right posterior. He likes Unna boots on the right leg for reasons that aren't really clear we had the usual 4 layer compression on the left. It may be necessary to move to the 4 layer compression on the right however for now I left them in the Unna boots 04/27/18; he is using his compression pumps at least once a day. He has still the wounds on the right lateral calf. The area right posteriorly has closed. He does not have an open wound on the left under 4 layer compression however on the dorsal left foot just proximal to the toes and the left third toe 2 small open areas were identified 05/11/18; he has not uses compression pumps. The areas on the right lateral calf have coalesced into one large wound necrotic surface. On the left side he has one small wound anteriorly however the edema is now weeping out of a large part of his left leg. He says he wasn't using his pumps because of the weeping fluid. I explained to him that this is the time he needs to pump more 05/18/18; patient states he is using his compression pumps twice a day. The area on the right lateral large wound albeit superficial. On the left side he has innumerable number of  small new wounds on the left calf particularly laterally but several anteriorly and medially. All these appear to have healthy granulated base these look like the remnants of blisters however they occurred under compression. The patient arrives in clinic today with his legs somewhat better. There is certainly less edema, less multiple open areas on the left calf and the right anterior leg looks somewhat better as well superficial and a  little smaller. However he relates pain and erythema over the last 3-4 days in the thigh and I looked at this today. He has not been systemically unwell no fever no chills no change in blood sugar values 05/25/18; comes in today in a better state. The severe cellulitis on his left leg seems better with the Keflex. Not as tender. He has not been systemically unwell Hard to find an open wound on the left lower leg using his compression pumps twice a day The confluent wounds on his right lateral calf somewhat better looking. These will ultimately need debridement I didn't do this today. 06/01/18; the severe cellulitis on the left anterior thigh has resolved and he is completed his Keflex. There is no open wound on the left leg however there is a superficial excoriation at the base of the third toe dorsally. Skin on the bottom of his left foot is macerated looking. The left the wounds on the lateral right leg actually looks some better although he did require debridement of the top half of this wound area with an open curet 06/09/18 on evaluation today patient appears to be doing poorly in regard to his right lower extremity in particular this appears to likely be infected he has very thick purulent discharge along with a bright green tent to the discharge. This makes me concerned about the possibility of pseudomonas. He's also having increased discomfort at this point on evaluation. Fortunately there does not appear to be any evidence of infection spreading to the other location at this time. 06/16/18 on evaluation today patient appears to actually be doing fairly well. His ulcer has actually diminished in size quite significantly at this point which is good news. Nonetheless he still does have some evidence of infection he did see infectious disease this morning before coming here for his appointment. I did review the results of their evaluation and their note today. They did actually have him  discontinue the Cipro and initiate treatment with linezolid at this time. He is doing this for the next seven days and they recommended a follow-up in four months with them. He is the keep a log of the need for intermittent antibiotic therapy between now and when he falls back up with infectious disease. This will help them gaze what exactly they need to do to try and help them out. 06/23/18; the patient arrives today with no open wounds on the left leg and left third toe healed. He is been using his compression pumps twice a day. On the right lateral leg he still has a sizable wound but this is a lot better than last time I saw this. 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 Has been using silver alginate under 4 layer compression 06/30/18; the only open wound he has is on the right lateral leg and this looks healthy. No debridement is required. We have been using silver alginate. He does not have an open wound on the left leg. There  is apparently some drainage from the dorsal proximal third toe on the left although I see no open wound here. 07/03/18 on evaluation today patient was actually here just for a nurse visit rapid change. However when he was here on Wednesday for his rat change due to having been healed on the left and then developing blisters we initiated the wrap again knowing that he would be back today for us to reevaluate and see were at. Unfortunately he has developed some cellulitis into the proximal portion of his right lower extremity even into the region of his thigh. 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 He was placed on one as what at that point. Nonetheless he is done with that and has been tolerating it well otherwise. Doxycycline which in the past really did not seem to be effective for him. Nonetheless I think the best  option may be for us to definitely reinitiate the antibiotics for a longer period of time. 07/07/18; since I last saw this patient a week ago he has had a difficult time. At that point he did not have an open wound on his left leg. We transitioned him into juxta light stockings. He was apparently in the clinic the next day with blisters on the left lateral and left medial lower calf. He also had weeping edema fluid. He was put back into a compression wrap. He was also in the clinic on Friday with intense erythema in his right thigh. Per the patient he was started on Bactrim however that didn't work at all in terms of relieving his pain and swelling. He has taken 3 doxycycline that he had left over from last time and that seems to of helped. He has blistering on the right thigh as well. 07/14/18; the erythema on his right thigh has gotten better with doxycycline that he is finishing. 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 Presumably this cellulitis in the thigh was not related to that although I think this is a bit concerning going forward. He still has an area on the right lateral calf the blister on the right medial calf just below the knee that was discussed above. On the left 2 small open areas left medial and left lateral. Edema control is adequate. He is using his compression pumps twice a day 07/20/18; continued improvement in the condition of both legs especially the edema in his bilateral thighs. He tells me he is been losing weight through a combination of diet and exercise. He is using his compression pumps twice a day. So overall she made to the remaining wounds 07/27/2018; continued improvement in condition of both legs. His edema is well controlled. The area on the right lateral leg is just about closed he had one blisters show up on the medial left upper calf. We have him in 4 layer compression. He is going on  a 10-day trip to [place], [place] and [place]. He will be driving. He wants to wear Unna boots because of the lessening amount of constriction. He will not use compression pumps while he is away 08/05/18 on evaluation today patient actually appears to be doing decently well all things considered in regard to his bilateral lower extremities. The worst ulcer is actually only posterior aspect of his left lower extremity with a four layer compression wrap cut into his leg a couple [name]  back. He did have a trip and actually had [employer] for the trip that he is worn since he was last here. Nonetheless he feels like the [employer] actually do better for him his swelling is up a little bit but he also with his trip was not taking his Lasix on a regular set schedule like he was supposed to be. He states that obviously the reason being that he cannot drive and keep going without having to urinate too frequently which makes it difficult. He did not have his pumps with him while he was away either which I think also maybe playing a role here too. 08/13/2018; the patient only has a small open wound on the right lateral calf which is a big improvement in the last month or 2. He also has the area posteriorly just below the posterior fossa on the left which I think was a wrap injury from several [name] ago. He has no current evidence of cellulitis. He tells me he is back into his compression pumps twice a day. He also tells me that while he was at the [place] somebody stole a section of his extremitease stockings 08/20/2018; back in the clinic with a much improved state. He only has small areas on the right lateral mid calf which is just about healed. This was is more substantial area for quite a prolonged period of time. He has a small open area on the left anterior tibia. The area on the posterior calf just below the popliteal fossa is closed today. He is using his compression pumps twice a  day 08/28/2018; patient has no open wound on the right leg. He has a smattering of open areas on the calf with some weeping lymphedema. More problematically than that it looks as though his wraps of slipped down in his usual he has very angry upper area of edema just below the right medial knee and on the right lateral calf. He has no open area on his feet. The patient is traveling to [facility] next week. I will send him in an antibiotic. We will continue to wrap the right leg. We ordered extremitease stockings for him last week and I plan to transition the right leg to a stocking when he gets home which will be in 10 days time. As usual he is very reluctant to take his pumps with him when he travels 09/07/2018; patient returns from [facility]. He shows me a picture of his left leg in the mid part of his trip last week with intense fire engine erythema. The picture look bad enough I would have considered sending him to the hospital. Instead he went to the wound care center in [facility]. They did not prescribe him antibiotics but he did take some doxycycline he had leftover from a previous visit. I had given him trimethoprim sulfamethoxazole before he left this did not work according to the patient. This is resulted in some improvement fortunately. He comes back with a large wound on the left posterior calf. Smaller area on the left anterior tibia. Denuded blisters on the dorsal left foot over his toes. Does not have much in the way of wounds on the right leg although he does have a very tender area on the right posterior area just below the popliteal fossa also suggestive of infection. He promises me he is back on his pumps twice a day 09/15/2018; the intense cellulitis in his left lower calf is a lot better.  The wound area on the posterior left calf is also so better. However he has reasonably extensive wounds on the dorsal aspect of his second and third toes and the proximal  foot just at the base of the toes. There is nothing open on the right leg 09/22/2018; the patient has excellent edema control in his legs bilaterally. He is using his external compression pumps twice a day. He has no open area on the right leg and only the areas in the left foot dorsally second and third toe area on the left side. He does not have any signs of active cellulitis. 10/06/2018; the patient has good edema control bilaterally. He has no open wound on the right leg. There is a blister in the posterior aspect of his left calf that we had to deal with today. He is using his compression pumps twice a day. There is no signs of active cellulitis. We have been using silver alginate to the wound areas. He still has vulnerable areas on the base of his left first second toes dorsally He has a his extremities stockings and we are going to transition him today into the stocking on the right leg. He is cautioned that he will need to continue to use the compression pumps twice a day. If he notices uncontrolled edema in the right leg he may need to go to 3 times a day. 10/13/2018; the patient came in for a nurse check on Friday he has a large flaccid blister on the right medial calf just below the knee. We unroofed this. He has this and a new area underneath the posterior mid calf which was undoubtedly a blister as well. He also has several small areas on the right which is the area we put his extremities stocking on. 10/19/2018; the patient went to see infectious disease this morning I am not sure if that was a routine follow-up in any case the doxycycline I had given him was discontinued and started on linezolid. He has not started this. It is easy to look at his left calf and the inflammation and think this is cellulitis however he is very tender in the tissue just below the popliteal fossa and I have no doubt that there is infection going on here. He states the problem he is having is that with the  compression pumps the edema goes down and then starts walking the wrap falls down. We will see if we can adhere this. He has 1 or 2 minuscule open areas on the right still areas that are weeping on the posterior left calf, the base of his left second and third toes 10/26/18; back today in clinic with quite of skin breakdown in his left anterior leg. This may have been infection the area below the popliteal fossa seems a lot better however tremendous epithelial loss on the left anterior mid tibia area over quite inexpensive tissue. He has 2 blisters on the right side but no other open wound here. 10/29/2018; came in urgently to see [place] today and we worked him in for review. He states that the 4 layer compression on the right leg caused pain he had to cut it down to roughly his mid calf this caused swelling above the wrap and he has blisters and skin breakdown today. As a result of the pain he has not been using his pumps. Both legs are a lot more edematous and there is a lot of weeping fluid. 11/02/18; arrives in clinic with continued difficulties in  the right leg> left. Leg is swollen and painful. multiple skin blisters and new open areas especially laterally. He has not been using his pumps on the right leg. 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 He is not systemically unwell. 11/09/2018; the patient claims he is being compliant with his pumps. He is finished the doxycycline I gave him last week. Culture I did of the wound on the right lateral leg showed a few very resistant methicillin staph aureus. This was resistant to doxycycline. Nevertheless he states the pain in the leg is a lot better which makes me wonder if the cultured organism was not really what was causing the problem nevertheless this is a very dangerous organism to be culturing out of any wound. His right leg is still a lot larger than the left. He is using an [employer] boot on this area,  he blames a 4-layer compression for causing the original skin breakdown which I doubt is true however I cannot talk him out of it. We have been using silver alginate to all of these areas which were initially blisters 11/16/2018; patient is being compliant with his external compression pumps at twice a day. Miraculously he arrives in clinic today with absolutely no open wounds. He has better edema control on the left where he has been using 4 layer compression versus wound of wounds on the right and I pointed this out to him. There is no inflammation in the skin in his lower legs which is also somewhat unusual for him. There is no open wounds on the dorsal left foot. He has extremitease stockings at home and I have asked him to bring these in next week. 11/25/18 patient's lower extremity on examination today on the left appears for the most part to be wound free. He does have an open wound on the lateral aspect of the right lower extremity but this is minimal compared to what I've seen in past. He does request that we go ahead and wrap the left leg as well even though there's nothing open just so hopefully it will not reopen in short order. 1/28; patient has superficial open wounds on the right lateral calf left anterior calf and left posterior calf. His edema control is adequate. 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 We have been using silver alginate as the primary dressing. He claims compliance with his compression pumps 2/4; patient has superficial open wounds on numerous areas of his left calf and again one on the left dorsal foot. The areas on the right lateral calf have healed. The cellulitis that I gave him doxycycline for last week is also resolved this was mostly on the left anterior calf just below the tibial tuberosity. His edema looks fairly well-controlled. He tells me he went to see his primary  doctor today and had blood work ordered 2/11; once again he has several open areas on the left calf left tibial area. Most of these are small and appear to have healthy granulation. He does not have anything open on the right. The edema and control in his thighs is pretty good which is usually a good indication he has been using his pumps as requested. 2/18; he continues to have several small areas on the left calf and left tibial area. Most of these are small healthy granulation. We put him in his stocking on the right  leg last week and he arrives with a superficial open area over the right upper tibia and a fairly large area on the right lateral tibia in similar condition. His edema control actually does not look too bad, he claims to be using his compression pumps twice a day 2/25. Continued small areas on the left calf and left tibial area. New areas especially on the right are identified just below the tibial tuberosity and on the right upper tibia itself. There are also areas of weeping edema fluid even without an obvious wound. He does not have a considerable degree of lymphedema but clearly there is more edema here than his skin can handle. He states he is using the pumps twice a day. We have an Unna boot on the right and 4 layer compression on the left. 3/3; he continues to have an area on the right lateral calf and right posterior calf just below the popliteal fossa. There is a fair amount of tenderness around the wound on the popliteal fossa but I did not see any evidence of cellulitis, could just be that the wrap came down and rubbed in this area. He does not have an open area on the left leg however there is an area on the left dorsal foot at the base of the third toe We have been using silver alginate to all wound areas 3/10; he did not have an open area on his left leg last time he was here a week ago. Today he arrives with a horizontal wound just below the tibial tuberosity and an  area on the left lateral calf. He has intense erythema and tenderness in this area. The area is on the right lateral calf and right posterior calf better than last week. We have been using silver alginate as usual 3/18 - Patient returns with 3 small open areas on left calf, and 1 small open area on right calf, the skin looks ok with no significant erythema, he continues the UNA boot on right and 4 layer compression on left. The right lateral calf wound is closed , the right posterior is small area. we will continue silver alginate to the areas. 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 He is been tolerating the dressing changes without complication. Fortunately she has made excellent improvement in regard to the overall status of his wounds. Unfortunately every time we cease wrapping him he ends up reopening in causing more significant issues at that point. Again I'm unsure of the best direction to take although I think the lymphedema clinic may be appropriate for him. 02/03/19 on evaluation today patient appears to be doing well in regard to the wounds that we saw him for last week unfortunately he has a new area on the proximal portion of his right medial/posterior lower extremity where the wrap somewhat slowed down and caused swelling and a blister to rub and open. Unfortunately this is the only opening that he has on either leg at this point. 02/17/19 on evaluation today patient's bilateral lower extremities appear to be doing well. He still completely healed in regard to the left lower extremity. In regard to the right lower extremity the area where the wrap and slid down and caused the blister still seems to be slightly open although this is dramatically better than during the  last evaluation two  [name] ago. I'm very pleased with the way this stands overall. 03/03/19 on evaluation today patient appears to be doing well in regard to his right lower extremity in general although he did have a new blister open this does not appear to be showing any evidence of active infection at this time. Fortunately there's No fevers, chills, nausea, or vomiting noted at this time. Overall I feel like he is making good progress it does feel like that the right leg will we perform the [employer] seems to do with a bit better than three layer wrap on the left which slid down on him. We may switch to doing bilateral in the book wraps. 5/4; I have not seen [name] in quite some time. According to our case manager he did not have an open wound on his left leg last week. He had 1 remaining wound on the right posterior medial calf. He arrives today with multiple openings on the left leg probably were blisters and/or wrap injuries from Unna boots. I do not think the Unna boot's will provide adequate compression on the left. I am also not clear about the frequency he is using the compression pumps. 03/17/19 on evaluation today patient appears to be doing excellent in regard to his lower extremities compared to last week's evaluation apparently. He had gotten significantly worse last week which is unfortunate. The [employer] wrap on the left did not seem to do very well for him at all and in fact it didn't control his swelling significantly enough he had an additional outbreak. Subsequently we go back to the four layer compression wrap on the left. This is good news. At least in that he is doing better and the wound seem to be killing him. He still has not heard anything from the lymphedema clinic. 03/24/19 on evaluation today patient actually appears to be doing much better in regard to his bilateral lower Trinity as compared to last week when I saw him. Fortunately there's no signs of active infection at this time. He  has been tolerating the dressing changes without complication. Overall I'm extremely pleased with the progress and appearance in general. 04/07/19 on evaluation today patient appears to be doing well in regard to his bilateral lower extremities. His swelling is significantly down from where it was previous. With that being said he does have a couple blisters still open at this point but fortunately nothing that seems to be too severe and again the majority of the larger openings has healed at this time. 04/14/19 on evaluation today patient actually appears to be doing quite well in regard to his bilateral lower extremities in fact I'm not even sure there's anything significantly open at this time at any site. Nonetheless he did have some trouble with these wraps where they are somewhat irritating him secondary to the fact that he has noted that the graph wasn't too close down to the end of this foot in a little bit short as well up to his knee. Otherwise things seem to be doing quite well. 04/21/19 upon evaluation today patient's wound bed actually showed evidence of being completely healed in regard to both lower extremities which is excellent news. There does not appear to be any signs of active infection which is also good news. I'm very pleased in this regard. No fevers, chills, nausea, or vomiting noted at this time. 04/28/19 on evaluation today patient appears to be doing a little bit worse in regard  to both lower extremities on the left mainly due to the fact that when he went infection disease the wrap was not wrapped quite high enough he developed a blister above this. On the right he is a small open area of nothing too significant but again this is continuing to give him some trouble he has been were in the Velcro compression that he has at home. 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 He's been tolerating the dressing changes  without complication. Fortunately there's no signs of active infection at this time. No fevers, chills, nausea, or vomiting noted at this time. We have been trying to get an appointment with her lymphedema clinic in [facility] but unfortunately nobody can get them on phone with not been able to even fax information over the patient likewise is not been able to get in touch with them. Overall I'm not sure exactly what's going on here with to reach out again today. 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 Still having a lot of drainage unfortunately. He tells me especially in the left but even on the right. There's no signs of active infection which is good news we've been using so ratcheted up to this point. 05/19/19 on evaluation today patient actually appears to be doing quite well with regard to his left lower extremity which is great news. Fortunately in regard to the right lower extremity has an issues with his wrap and he subsequently did remove this from what I'm understanding. Nonetheless long story short is what he had rewrapped once he removed it subsequently had maggots underneath this wrap whenever he came in for evaluation today. With that being said they were obviously completely cleaned away by the nursing staff. The visit today which is excellent news. However he does appear to potentially have some infection around the right ankle region where the maggots were located as well. He will likely require anabiotic therapy today. 05/26/19 on evaluation today patient actually appears to be doing much better in regard to his bilateral lower extremities. I feel like the infection is under much better control. With that being said there were maggots noted when the wrap was removed yet again today. Again this could have potentially been left over from previous although at this time there does  not appear to be any signs of significant drainage there was obviously on the wrap some drainage as well this contracted gnats or otherwise. Either way I do not see anything that appears to be doing worse in my pinion and in fact I think his drainage has slowed down quite significantly likely mainly due to the fact to his infection being under better control. 06/02/2019 on evaluation today patient actually appears to be doing well with regard to his bilateral lower extremities there is no signs of active infection at this time which is great news. With that being said he does have several open areas more so on the right than the left but nonetheless these are all significantly better than previously noted. 06/09/2019 on evaluation today patient actually appears to be doing well. His wrap stayed up and he did not cause any problems he had more drainage on the right compared to the left but overall I do not see any major issues at this time which is great news. 06/16/2019 on evaluation today patient appears to be  doing excellent with regard to his lower extremities the only area that is open is a new blister that can have opened as of today on the medial ankle on the left. Other than this he really seems to be doing great I see no major issues at this point. 06/23/2019 on evaluation today patient appears to be doing quite well with regard to his bilateral lower extremities. In fact he actually appears to be almost completely healed there is a small area of weeping noted of the right lower extremity just above the ankle. Nonetheless fortunately there is no signs of active infection at this time which is good news. No fevers, chills, nausea, vomiting, or diarrhea. 8/24; the patient arrived for a nurse visit today but complained of very significant pain in the left leg and therefore I was asked to look at this. Noted that he did not have an open area on the left leg last week nevertheless this was wrapped.  The patient states that he is not been able to put his compression pumps on the left leg because of the discomfort. He has not been systemically unwell 06/30/2019 on evaluation today patient unfortunately despite being excellent last week is doing much worse with regard to his left lower extremity today. In fact he had to come in for a nurse on Monday where his left leg had to be rewrapped due to excessive weeping Dr. Leanord Hawking placed him on doxycycline at that point. Fortunately there is no signs of active infection Systemically at this time which is good news. 07/07/2019 in regard to the patient's wounds today he actually seems to be doing well with his right lower extremity there really is nothing open or draining at this point this is great news. Unfortunately the left lower extremity is given him additional trouble at this time. There does not appear to be any signs of active infection nonetheless he does have a lot of edema and swelling noted at this point as well as blistering all of which has led to a much more poor appearing leg at this time compared to where it was 2 [name] ago when it was almost completely healed. Obviously this is a little discouraging for the patient. He is try to contact the lymphedema clinic in [place] he has not been able to get through to them. 07/14/2019 on evaluation today patient actually appears to be doing slightly better with regard to his left lower extremity ulcers. Overall I do feel like at least at the top of the wrap that we have been placing this area has healed quite nicely and looks much better. The remainder of the leg is showing signs of improvement. Unfortunately in the thigh area he still has an open region on the left and again on the right he has been utilizing just a Band-Aid on an area that also opened on the thigh. Again this is an area that were not able to wrap although we did do an Ace wrap to provide some compression that something that  obviously is a little less effective than the compression wraps we have been using on the lower portion of the leg. He does have an appointment with the lymphedema clinic in [facility] on Friday. 07/21/2019 on evaluation today patient appears to be doing better with regard to his lower extremity ulcers. He has been tolerating the dressing changes without complication. Fortunately there is no signs of active infection at this time. No fevers, chills, nausea, vomiting, or diarrhea. I did receive  the paperwork from the physical therapist at the lymphedema clinic in [place]. Subsequently I signed off on that this morning and sent that back to him for further progression with the treatment plan. 07/28/2019 on evaluation today patient appears to be doing very well with regard to his right lower extremity where I do not see any open wounds at this point. Fortunately he is feeling great as far as that is concerned as well. In regard to the left lower extremity he has been having issues with still several areas of weeping and edema although the upper leg is doing better his lower leg still I think is going require the compression wrap at this time. No fevers, chills, nausea, vomiting, or diarrhea. 08/04/2019 on evaluation today patient unfortunately is having new wounds on the right lower extremity. Again we have been using Unna boot wrap on that side. We switched him to using his juxta lite wrap at home. With that being said he tells me he has been using it although his legs extremely swollen and to be honest really does not appear that he has been. I cannot know that for sure however. Nonetheless he has multiple new wounds on the right lower extremity at this time. Obviously we will have to see about getting this rewrapped for him today. 08/11/2019 on evaluation today patient appears to be doing fairly well with regard to his wounds. He has been tolerating the dressing changes including the  compression wraps without complication. He still has a lot of edema in his upper thigh regions bilaterally he is supposed to be seeing the lymphedema clinic on the 15th of this month once his wraps arrive for the upper part of his legs. 08/18/2019 on evaluation today patient appears to be doing well with regard to his bilateral lower extremities at this point. He has been tolerating the dressing changes without complication. Fortunately there is no signs of active infection which is also good news. He does have a couple weeping areas on the first and second toe of the right foot he also has just a small area on the left foot upper leg and a small area on the left lower leg but overall he is doing quite well in my opinion. He is supposed to be getting his wraps shortly in fact tomorrow and then subsequently is seeing the lymphedema clinic next Wednesday on the 21st. Of note he is also leaving on the 25th to go on vacation for a week to the beach. For that reason and since there is some uncertainty about what there can be doing at lymphedema clinic next Wednesday I am get a make an appointment for next Friday here for [place] to see what we need to do for him prior to him leaving for vacation. 10/23; patient arrives in considerable pain predominantly in the upper posterior calf just distal to the popliteal fossa also in the wound anteriorly above the major wound. This is probably cellulitis and he has had this recurrently in the past. He has no open wound on the right side and he has had an Radio broadcast assistant in that area. Finally I note that he has an area on the left posterior calf which by enlarge is mostly epithelialized. This protrudes beyond the borders of the surrounding skin in the setting of dry scaly skin and lymphedema. The patient is leaving for [facility] on Sunday. Per his longstanding pattern, he will not take his compression pumps with him predominantly out of fear that they  will be stolen. He  therefore asked that we put a Unna boot back on the right leg. He will also contact the wound care center in [facility] to see if they can change his dressing in the mid week. 11/3; patient returned from his vacation to [facility]. He was seen on 1 occasion at their wound care center. They did a 2 layer compression system as they did not have our 4-layer wrap. I am not completely certain what they put on the wounds. They did not change the Unna boot on the right. The patient is also seeing a lymphedema specialist physical therapist in [place]. It appears that he has some compression sleeve for his thighs which indeed look quite a bit better than I am used to seeing. He pumps over these with his external compression pumps. 11/10; the patient has a new wound on the right medial thigh otherwise there is no open areas on the right. He has an area on the left leg posteriorly anteriorly and medially and an area over the left second toe. We have been using silver alginate. He thinks the injury on his thigh is secondary to friction from the compression sleeve he has. 11/17; the patient has a new wound on the right medial thigh last week. He thinks this is because he did not have a underlying stocking for his thigh juxta lite apparatus. He now has this. The area is fairly large and somewhat angry but I do not think he has underlying cellulitis. He has a intact blister on the right anterior tibial area. Small wound on the right great toe dorsally Small area on the medial left calf. 11/30; the patient does not have any open areas on his right leg and we did not take his juxta lite stocking off. However he states that on Friday his compression wrap fell down lodging around his upper mid calf area. As usual this creates a lot of problems for him. He called urgently today to be seen for a nurse visit however the nurse visit turned into a provider visit because of extreme erythema and pain in the  left anterior tibia extending laterally and posteriorly. The area that is problematic is extensive 10/06/2019 upon evaluation today patient actually appears to be doing poorly in regard to his left lower extremity. He Dr. Leanord Hawking did place him on doxycycline this past Monday apparently due to the fact that he was doing much worse in regard to this left leg. Fortunately the doxycycline does seem to be helping. Unfortunately we are still having a very difficult time getting his edema under any type of control in order to anticipate discharge at some point. The only way were really able to control his lymphedema really is with compression wraps and that has only even seemingly temporary. He has been seeing a lymphedema clinic they are trying to help in this regard but still this has been somewhat frustrating in general for the patient. 10/13/19 on evaluation today patient appears to be doing excellent with regard to his right lower extremity as far as the wounds are concerned. His swelling is still quite extensive unfortunately. He is still having a lot of drainage from the thigh areas bilaterally which is unfortunate. He's been going to lymphedema clinic but again he still really does not have this edema under control as far as his lower extremities are concern. With regard to his left lower extremity this seems to be improving and I do believe the doxycycline has been  of benefit for him. He is about to complete the doxycycline. 10/20/2019 on evaluation today patient appears to be doing poorly in regard to his bilateral lower extremities. More in the right thigh he has a lot of irritation at this site unfortunately. In regard to the left lower extremity the wrap was not quite as high it appears and does seem to have caused him some trouble as well. Fortunately there is no evidence of systemic infection though he does have some blue-green drainage which has me concerned for the possibility of  Pseudomonas. He tells me he is previously taking Cipro without complications and he really does not care for Levaquin however due to some of the side effects he has. He is not allergic to any medications specifically antibiotics that were aware of. 10/27/2019 on evaluation today patient actually does appear to be for the most part doing better when compared to last week's evaluation. With that being said he still has multiple open wounds over the bilateral lower extremities. He actually forgot to start taking the Cipro and states that he still has the whole bottle. He does have several new blisters on left lower extremity today I think I would recommend he go ahead and take the Cipro based on what I am seeing at this point. 12/30-Patient comes at 1 week visit, 4 layer compression wraps on the left and Unna boot on the right, primary dressing Xtrasorb and silver alginate. Patient is taking his Cipro and has a few more days left probably 5-6, and the legs are doing better. He states he is using his compressions devices which I believe he has 11/10/2019 on evaluation today patient actually appears to be much better than last time I saw him 2 [name] ago. His wounds are significantly improved and overall I am very pleased in this regard. Fortunately there is no signs of active infection at this time. He is just a couple of days away from completing Cipro. Overall his edema is much better he has been using his lymphedema pumps which I think is also helping at this point. 11/17/2019 on evaluation today patient appears to be doing excellent in regard to his wounds in general. His legs are swollen but not nearly as much as they have been in the past. Fortunately he is tolerating the compression wraps without complication. No fevers, chills, nausea, vomiting, or diarrhea. He does have some erythema however in the distal portion of his right lower extremity specifically around the forefoot and toes there is a  little bit of warmth here as well. 11/24/2019 on evaluation today patient appears to be doing well with regard to his right lower extremity I really do not see any open wounds at this point. His left lower extremity does have several open areas and his right medial thigh also is open. Other than this however overall the patient seems to be making good progress and I am very pleased at this point. 12/01/2019 on evaluation today patient appears to be doing poorly at this point in regard to his left lower extremity has several new blisters despite the fact that we have him in compression wraps. In fact he had a 4-layer compression wrap, his upper thigh wrapped from lymphedema clinic, and a juxta light over top of the 4 layer compression wrap the lymphedema clinic applied and despite all this he still develop blisters underneath. Obviously this does have me concerned about the fact that unfortunately despite what we are doing to try to get wounds  healed he continues to have new areas arise I do not think he is ever good to be at the point where he can realistically just use wraps at home to keep things under control. Typically when we heal him it takes about 1-2 days before he is back in the clinic with severe breakdown and blistering of his lower extremities bilaterally. This is happened numerous times in the past. Unfortunately I think that we may need some help as far as overall fluid overload to kind of limit what we are seeing and get things under better control. 12/08/2019 on evaluation today patient presents for follow-up concerning his ongoing bilateral lower extremity edema. Unfortunately he is still having quite a bit of swelling the compression wraps are controlling this to some degree but he did see Dr. [name] his cardiologist I do have that available for review today as far as the appointment was concerned that was on 12/06/2019. Obviously that she has been 2 days ago. The patient states that he  is only been taking the Lasix 80 mg 1 time a day he had told me previously he was taking this twice a day. Nonetheless Dr. [name] recommended this be up to 80 mg 2 times a day for the patient as he did appear to be fluid overloaded. With that being said the patient states he did this yesterday and he was unable to go anywhere or do anything due to the fact that he was constantly having to urinate. Nonetheless I think that this is still good to be something that is important for him as far as trying to get his edema under control at all things that he is going to be able to just expect his wounds to get under control and things to be better without going through at least a period of time where he is trying to stabilize his fluid management in general and I think increasing the Lasix is likely the first step here. It was also mentioned the possibility that the patient may require metolazone. With that being said he wanted to have the patient take Lasix twice a day first and then reevaluating 2 months to see where things stand. 12/15/2019 upon evaluation today patient appears to be doing regard to his legs although his toes are showing some signs of weeping especially on the left at this point to some degree on the right. There does not appear to be any signs of active infection and overall I do feel like the compression wraps are doing well for him but he has not been able to take the Lasix at home and the increased dose that Dr. [name] recommended. He tells me that just not go to be feasible for him. Nonetheless I think in this case he should probably send a message to Dr. [name] in order to discuss options from the standpoint of possible admission to get the fluid off or otherwise going forward. 12/22/2019 upon evaluation today patient appears to be doing fairly well with regard to his lower extremities at this point. In fact he would be doing excellent if it was not for the fact that his right anterior  thigh apparently had an allergic reaction to adhesive tape that he used. The wound itself that we have been monitoring actually appears to be healed. There is a lot of irritation at this point. 12/29/2019 upon evaluation today patient appears to be doing well in regard to his lower extremities. His left medial thigh is open and somewhat draining  today but this is the only region that is open the right has done much better with the treatment utilizing the steroid cream that I prescribed for him last week. Overall I am pleased in that regard. Fortunately there is no signs of active infection at this time. No fevers, chills, nausea, vomiting, or diarrhea. 01/05/2020 upon evaluation today patient appears to be doing more poorly in regard to his right lower extremity at this point upon evaluation today. Unfortunately he continues to have issues in this regard and I think the biggest issue is controlling his edema. This obviously is not very well controlled at this point is been recommended that he use the Lasix twice a day but he has not been able to do that. Unfortunately I think this is leading to an issue where honestly he is not really able to effectively control his edema and therefore the wounds really are not doing significantly better. I do not think that he is going to be able to keep things under good control unless he is able to control his edema much better. I discussed this again in great detail with him today. 01/12/2020 good news is patient actually appears to be doing quite well today at this point. He does have an appointment with lymphedema clinic tomorrow. His legs appear healed and the toe on the left is almost completely healed. In general I am very pleased with how things stand at this point. 01/19/2020 upon evaluation today patient appears to actually be doing well in regard to his lower extremities there is nothing open at this point. Fortunately he has done extremely well more  recently. Has been seeing lymphedema clinic as well. With that being said he has Velcro wraps for his lower legs as well as his upper legs. The only wound really is on his toe which is the right great toe and this is barely anything even there. With all that being said I think it is good to be appropriate today to go ahead and switch him over to the Velcro compression wraps. 01/26/2020 upon evaluation today patient appears to be doing worse with regard to his lower extremities after last week switch him to Velcro compression wraps. Unfortunately he lasted less than 24 hours he did not have the sock portion of his Velcro wrap on the left leg and subsequently developed a blister underneath the Velcro portion. Obviously this is not good and not what we were looking for at this point. He states the lymphedema clinic did tell him to wear the wrap for 23 hours and take him off for 1 I am okay with that plan but again right now we got a get things back under control again he may have some cellulitis noted as well. Electronic Signature(s) Signed: 01/26/2020 11:30:28 AM By: Lenda Kelp PA-C Entered By: Lenda Kelp on 01/26/2020 11:30:28 -------------------------------------------------------------------------------- Physical Exam Details Patient Name: Date of Service: [name], [name] 01/26/2020 10:30 AM Medical Record [id number] Patient Account Number: [id number] Date of Birth/Sex: Treating RN: [birth year] (69 y.o. [name] Primary Care Provider: Nicoletta [name] Other Clinician: Referring Provider: Treating Provider/Extender:[name], [name] in Treatment: 209 Constitutional Well-nourished and well-hydrated in no acute distress. Respiratory normal breathing without difficulty. Psychiatric this patient is able to make decisions and demonstrates good insight into disease process. Alert and Oriented x 3. pleasant and cooperative. Notes Patient's wound bed  currently showed signs of blister tissue on the left lower extremity I did clean this away  just with saline gauze. Post debridement the wound bed appears to be doing okay there is some erythema inferior to the wound which I think is evidence of infection at this point unfortunately. Electronic Signature(s) Signed: 01/26/2020 11:30:51 AM By: Lenda Kelp PA-C Entered By: Lenda Kelp on 01/26/2020 11:30:50 -------------------------------------------------------------------------------- Physician Orders Details Patient Name: Date of Service: [name], [name] 01/26/2020 10:30 AM Medical Record [id number] Patient Account Number: [id number] Date of Birth/Sex: Treating RN: 25-Apr-1951 (69 y.o. 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 Dressing Change Frequency Wound #163 Left,Medial Lower Leg Do not change entire dressing for one week. Skin Barriers/Peri-Wound Care Wound #163 Left,Medial Lower Leg Moisturizing lotion - to leg Wound Cleansing Wound #161 Right Toe Great May shower and wash wound with soap and water. Wound #162 Right Toe Second May shower and wash wound with soap and water. Wound #163 Left,Medial Lower Leg May shower with protection. Primary  Wound Dressing Calcium Alginate with Silver - to weeping area on left lower leg Wound #161 Right Toe Great Calcium Alginate with Silver Wound #162 Right Toe Second Calcium Alginate with Silver Secondary Dressing Wound #161 Right Toe Great Kerlix/Rolled Gauze Dry Gauze Wound #162 Right Toe Second Kerlix/Rolled Gauze Dry Gauze Wound #163 Left,Medial Lower Leg ABD pad - to left lower leg Zetuvit or Kerramax - to left lower leg Edema Control 4 layer compression: Left lower extremity Unna Boot to Right Lower Extremity Avoid standing for long periods of time Elevate legs to the level of the heart or above for 30 minutes daily and/or when sitting, a frequency of: - whenever sitting Exercise regularly Segmental Compressive Device. - lymphadema pumps 60 minutes 2 times per day Additional Orders / Instructions Other: - take lasix as prescribed by cardiologist Patient Medications Allergies: No Known Allergies Notifications Medication Indication Start End doxycycline hyclate 01/26/2020 DOSE 1 - oral 100 mg capsule - 1 capsule oral taken 2 times per day for 14 days Electronic Signature(s) Signed: 01/26/2020 11:34:37 AM By: Lenda Kelp PA-C Entered By: Lenda Kelp on 01/26/2020 11:34:28 -------------------------------------------------------------------------------- Problem List Details Patient Name: Date of Service: [name] 01/26/2020 10:30 AM Medical Record [id number] Patient Account Number: [id number] Date of Birth/Sex: Treating RN: [birth year] (69 y.o. 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 01/22/2016 No Yes and inflammation of bilateral lower extremity I89.0 Lymphedema, not elsewhere classified 01/22/2016 No Yes E11.622 Type 2 diabetes mellitus with other skin ulcer 01/22/2016 No Yes 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, 01/22/2016 No Yes unspecified L03.116 Cellulitis of left lower limb  04/01/2017 No Yes Inactive Problems ICD-10 Code Description Active Date Inactive Date 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 06/30/2017 skin L97.521 Non-pressure chronic ulcer of other part of left foot limited to 04/27/2018 04/27/2018 breakdown of skin L03.115 Cellulitis of right lower limb 12/22/2017 12/22/2017 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 with other specified 06/30/2018 06/30/2018 severity L97.511 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 06/30/2018 06/30/2018 breakdown of skin Resolved Problems Electronic Signature(s) Signed: 01/26/2020 10:26:08 AM By: Lenda Kelp PA-C Entered By: Lenda Kelp on 01/26/2020 10:26:06 -------------------------------------------------------------------------------- Progress Note Details Patient Name: Date of Service: [name], [name] 01/26/2020 10:30 AM Medical Record [id number] Patient Account Number: [id number] Date of Birth/Sex: Treating RN: 09-Aug-1951 (69 y.o. [name] Primary Care Provider: Nicoletta [name] Other Clinician: Referring Provider: Treating Provider/Extender:[name], [name] in Treatment: 209 Subjective Chief Complaint Information obtained from Patient patient is here for evaluation venous/lymphedema weeping History of Present Illness (HPI) Referred by PCP for consultation. Patient has long standing history of BLE venous stasis, no prior ulcerations. At beginning of month, developed  cellulitis and weeping. Received IM Rocephin followed by Keflex and resolved. Wears compression stocking, appr 6 months old. Not sure strength. No present drainage. 01/22/16 this is a patient who is a type II diabetic on insulin. 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 He tells me he religiously wears pressure stockings of uncertain strength. He was here with weeping edema about 8 months ago but did not have an open wound. Roughly a month ago he had a reopening on his bilateral legs. He is been using bandages and Neosporin. He does not complain of pain. 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 Last BUN/creatinine I have is from 11/20/15 at 13 and 1.0 respectively 01/29/16; patient arrives today having tolerated the Profore wrap. He brought in his stockings and these are 18 mmHg stockings he bought from [employer]. The compression here is likely inadequate. He does not complain of pain or excessive drainage she has no systemic symptoms. The wound on the right looks improved as does the one on the left although one on the left is more substantial with still tissue at risk below the actual wound area on the bilateral posterior calf 02/05/16; patient arrives with poor edema control. He states that we did put a 4 layer compression on it last week. No weight appear 5 this. 02/12/16; the area on the posterior right Has healed. The left Has a substantial wound that has necrotic surface eschar that requires a debridement with a curette. 02/16/16;the patient called or a Nurse visit secondary to increased swelling. He had been in earlier in the week with his right leg healed. He was transitioned to is on pressure stocking on the right leg with the only open wound on the left, a substantial area on the left posterior calf. Note he has a history of severe lower extremity  edema, he has a history of chronic atrial fibrillation but not heart failure per my notes but I'll need to research this. He is not complaining of chest pain shortness of breath or orthopnea. The intake nurse noted blisters on the previously closed right leg 02/19/16; this is the patient's regular visit day. I see him on Friday with escalating edema new wounds on the right leg and clear  signs of at least right ventricular heart failure. I increased his Lasix to 40 twice a day. He is returning currently in follow-up. States he is noticed a decrease in that the edema 02/26/16 patient's legs have much less edema. There is nothing really open on the right leg. The left leg has improved condition of the large superficial wound on the posterior left leg 03/04/16; edema control is very much better. The patient's right leg wounds have healed. On the left leg he continues to have severe venous inflammation on the posterior aspect of the left leg. There is no tenderness and I don't think any of this is cellulitis. 03/11/16; patient's right leg is married healed and he is in his own stocking. The patient's left leg has deteriorated somewhat. There is a lot of erythema around the wound on the posterior left leg. There is also a significant rim of erythema posteriorly just above where the wrap would've ended there is a new wound in this location and a lot of tenderness. Can't rule out cellulitis in this area. 03/15/16; patient's right leg remains healed and he is in his own stocking. The patient's left leg is much better than last review. His major wound on the posterior aspect of his left Is almost fully epithelialized. He has 3 small injuries from the wraps. Really. Erythema seems a lot better on antibiotics 03/18/16; right leg remains healed and he is in his own stocking. The patient's left leg is much better. The area on the posterior aspect of the left calf is fully epithelialized. His 3 small injuries which  were wrap injuries on the left are improved only one seems still open his erythema has resolved 03/25/16; patient's right leg remains healed and he is in his own stocking. There is no open area today on the left leg posterior leg is completely closed up. His wrap injuries at the superior aspect of his leg are also resolved. He looks as though he has some irritation on the dorsal ankle but this is fully epithelialized without evidence of infection. 03/28/16; we discharged this patient on Monday. Transitioned him into his own stocking. There were problems almost immediately with uncontrolled swelling weeping edema multiple some of which have opened. He does not feel systemically unwell in particular no chest pain no shortness of breath and he does not feel 04/08/16; the edema is under better control with the Profore light wrap but he still has pitting edema. There is one large wound anteriorly 2 on the medial aspect of his left leg and 3 small areas on the superior posterior calf. Drainage is not excessive he is tolerating a Profore light well 04/15/16; put a Profore wrap on him last week. This is controlled is edema however he had a lot of pain on his left anterior foot most of his wounds are healed 04/22/16 once again the patient has denuded areas on the left anterior foot which he states are because his wrap slips up word. He saw his primary physician today is on Lasix 40 twice a day and states that he his weight is down 20 pounds over the last 3 months. 04/29/16: Much improved. left anterior foot much improved. He is now on Lasix 80 mg per day. Much improved edema control 05/06/16; I was hoping to be able to discharge him today however once again he has blisters at a low level of where the compression was placed last week mostly on his left lateral but also his left medial leg and  a small area on the anterior part of the left foot. 05/09/16; apparently the patient went home after his appointment on 7/4  later in the evening developing pain in his upper medial thigh together with subjective fever and chills although his temperature was not taken. The pain was so intense he felt he would probably have to call 911. However he then remembered that he had leftover doxycycline from a previous round of antibiotics and took these. By the next morning he felt a lot better. He called and spoke to one of our nurses and I approved doxycycline over the phone thinking that this was in relation to the wounds we had previously seen although they were definitely were not. The patient feels a lot better old fever no chills he is still working. Blood sugars are reasonably controlled 05/13/16; patient is back in for review of his cellulitis on his anterior medial upper thigh. He is taking doxycycline this is a lot better. Culture I did of the nodular area on the dorsal aspect of his foot grew MRSA this also looks a lot better. 05/20/16; the patient is cellulitis on the medial upper thigh has resolved. All of his wound areas including the left anterior foot, areas on the medial aspect of the left calf and the lateral aspect of the calf at all resolved. He has a new blister on the left dorsal foot at the level of the fourth toe this was excised. No evidence of infection 05/27/16; patient continues to complain weeping edema. He has new blisterlike wounds on the left anterior lateral and posterior lateral calf at the top of his wrap levels. The area on his left anterior foot appears better. He is not complaining of fever, pain or pruritus in his feet. 05/30/16; the patient's blisters on his left anterior leg posterior calf all look improved. He did not increase the Lasix 100 mg as I suggested because he was going to run out of his 40 mg tablets. He is still having weeping edema of his toes 06/03/16; I renewed his Lasix at 80 mg once a day as he was about to run out when I last saw him. He is on 80 mg of Lasix now. I have  asked him to cut down on the excessive amount of water he was drinking and asked him to drink according to his thirst mechanisms 06/12/2016 -- was seen 2 days ago and was supposed to wear his compression stockings at home but he is developed lymphedema and superficial blisters on the left lower extremity and hence came in for a review 06/24/16; the remaining wound is on his left anterior leg. He still has edema coming from between his toes. There is lymphedema here however his edema is generally better than when I last saw this. He has a history of atrial fibrillation but does not have a known history of congestive heart failure nevertheless I think he probably has this at least on a diastolic basis. 07/01/16 I reviewed his echocardiogram from January 2017. This was essentially normal. He did not have LVH, EF of 55-60%. His right ventricular function was normal although he did have trivial tricuspid and pulmonic regurgitation. This is not audible on exam however. I increased his Lasix to do massive edema in his legs well above his knees I think in early July. He was also drinking an excessive amount of water at the time. 07/15/16; missed his appointment last week because of the Labor Day holiday on Monday. He could not  get another appointment later in the week. Started to feel the wrap digging in superiorly so we remove the top half and the bottom half of his wrap. He has extensive erythema and blistering superiorly in the left leg. Very tender. Very swollen. Edema in his foot with leaking edema fluid. He has not been systemically unwell 07/22/16; the area on the left leg laterally required some debridement. The medial wounds look more stable. His wrap injury wounds appear to have healed. Edema and his foot is better, weeping edema is also better. He tells me he is meeting with the supplier of the external compression pumps at work 08/05/16; the patient was on vacation last week in [facility]. His  wrap is been on for an extended period of time. Also over the weekend he developed an extensive area of tender erythema across his anterior medial thigh. He took to doxycycline yesterday that he had leftover from a previous prescription. The patient complains of weeping edema coming out of his toes 08/08/16; I saw this patient on 10/2. He was tender across his anterior thigh. I put him on doxycycline. He returns today in follow-up. He does not have any open wounds on his lower leg, he still has edema weeping into his toes. 08/12/16; patient was seen back urgently today to follow-up for his extensive left thigh cellulitis/erysipelas. He comes back with a lot less swelling and erythema pain is much better. I believe I gave him Augmentin and Cipro. His wrap was cut down as he stated a roll down his legs. He developed blistering above the level of the wrap that remained. He has 2 open blisters and 1 intact. 08/19/16; patient is been doing his primary doctor who is increased his Lasix from 40-80 once a day or 80 already has less edema. Cellulitis has remained improved in the left thigh. 2 open areas on the posterior left calf 08/26/16; he returns today having new open blisters on the anterior part of his left leg. He has his compression pumps but is not yet been shown how to use some vital representative from the supplier. 09/02/16 patient returns today with no open wounds on the left leg. Some maceration in his plantar toes 09/10/2016 -- Dr. Leanord Hawking had recently discharged him on 09/02/2016 and he has come right back with redness swelling and some open ulcers on his left lower extremity. He says this was caused by trying to apply his compression stockings and he's been unable to use this and has not been able to use his lymphedema pumps. He had some doxycycline leftover and he has started on this a few days ago. 09/16/16; there are no open wounds on his leg on the left and no evidence of cellulitis. He  does continue to have probable lymphedema of his toes, drainage and maceration between his toes. He does not complain of symptoms here. I am not clear use using his external compression pumps. 09/23/16; I have not seen this patient in 2 [name]. He canceled his appointment 10 days ago as he was going on vacation. He tells me that on Monday he noticed a large area on his posterior left leg which is been draining copiously and is reopened into a large wound. He is been using ABDs and the external part of his juxtalite, according to our nurse this was not on properly. 10/07/16; Still a substantial area on the posterior left leg. Using silver alginate 10/14/16; in general better although there is still open area which looks healthy.  Still using silver alginate. He reminds me that this happen before he left for [facility]. Today while he was showering in the morning. He had been using his juxtalite's 10/21/16; the area on his posterior left leg is fully epithelialized. However he arrives today with a large area of tender erythema in his medial and posterior left thigh just above the knee. I have marked the area. Once again he is reluctant to consider hospitalization. I treated him with oral antibiotics in the past for a similar situation with resolution I think with doxycycline however this area it seems more extensive to me. He is not complaining of fever but does have chills and says states he is thirsty. His blood sugar today was in the 140s at home 10/25/16 the area on his posterior left leg is fully epithelialized although there is still some weeping edema. The large area of tenderness and erythema in his medial and posterior left thigh is a lot less tender although there is still a lot of swelling in this thigh. He states he feels a lot better. He is on doxycycline and Augmentin that I started last week. This will continued until Tuesday, December 26. I have ordered a duplex ultrasound of the left  thigh rule out DVT whether there is an abscess something that would need to be drained I would also like to know. 11/01/16; he still has weeping edema from a not fully epithelialized area on his left posterior calf. Most of the rest of this looks a lot better. He has completed his antibiotics. His thigh is a lot better. Duplex ultrasound did not show a DVT in the thigh 11/08/16; he comes in today with more Denuded surface epithelium from the posterior aspect of his calf. There is no real evidence of cellulitis. The superior aspect of his wrap appears to have put quite an indentation in his leg just below the knee and this may have contributed. He does not complain of pain or fever. We have been using silver alginate as the primary dressing. The area of cellulitis in the right thigh has totally resolved. He has been using his compression stockings once a week 11/15/16; the patient arrives today with more loss of epithelium from the posterior aspect of his left calf. He now has a fairly substantial wound in this area. The reason behind this deterioration isn't exactly clear although his edema is not well controlled. He states he feels he is generally more swollen systemically. He is not complaining of chest pain shortness of breath fever. Tells me he has an appointment with his primary physician in early February. He is on 80 mg of oral Lasix a day. He claims compliance with the external compression pumps. He is not having any pain in his legs similar to what he has with his recurrent cellulitis 11/22/16; the patient arrives a follow-up of his large area on his left lateral calf. This looks somewhat better today. He came in earlier in the week for a dressing change since I saw him a week ago. He is not complaining of any pain no shortness of breath no chest pain 11/28/16; the patient arrives for follow-up of his large area on the left lateral calf this does not look better. In fact it is larger weeping  edema. The surface of the wound does not look too bad. We have been using silver alginate although I'm not certain that this is a dressing issue. 12/05/16; again the patient follows up for a large wound  on the left lateral and left posterior calf this does not look better. There continues to be weeping edema necrotic surface tissue. More worrisome than this once again there is erythema below the wound involving the distal Achilles and heel suggestive of cellulitis. He is on his feet working most of the day of this is not going well. We are changing his dressing twice a week to facilitate the drainage. 12/12/16; not much change in the overall dimensions of the large area on the left posterior calf. This is very inflamed looking. I gave him an. Doxycycline last week does not really seem to have helped. He found the wrap very painful indeed it seems to of dog into his legs superiorly and perhaps around the heel. He came in early today because the drainage had soaked through his dressings. 12/19/16- patient arrives for follow-up evaluation of his left lower extremity ulcers. He states that he is using his lymphedema pumps once daily when there is "no drainage". He admits to not using his lipedema pumps while under current treatment. His blood sugars have been consistently between 150-200. 12/26/16; the patient is not using his compression pumps at home because of the wetness on his feet. I've advised him that I think it's important for him to use this daily. He finds his feet too wet, he can put a plastic bag over his legs while he is in the pumps. Otherwise I think will be in a vicious circle. We are using silver alginate to the major area on his left posterior calf 01/02/17; the patient's posterior left leg has further of all into 3 open wounds. All of them covered with a necrotic surface. He claims to be using his compression pumps once a day. His edema control is marginal. Continue with silver  alginate 01/10/17; the patient's left posterior leg actually looks somewhat better. There is less edema, less erythema. Still has 3 open areas covered with a necrotic surface requiring debridement. He claims to be using his compression pumps once a day his edema control is better 01/17/17; the patient's left posterior calf look better last week when I saw him and his wrap was changed 2 days ago. He has noted increasing pain in the left heel and arrives today with much larger wounds extensive erythema extending down into the entire heel area especially tender medially. He is not systemically unwell CBGs have been controlled no fever. Our intake nurse showed me limegreen drainage on his AVD pads. 01/24/17; his usual this patient responds nicely to antibiotics last week giving him Levaquin for presumed Pseudomonas. The whole entire posterior part of his leg is much better much less inflamed and in the case of his Achilles heel area much less tender. He has also had some epithelialization posteriorly there are still open areas here and still draining but overall considerably better 01/31/17- He has continue to tolerate the compression wraps. he states that he continues to use the lymphedema pumps daily, and can increase to twice daily on the weekends. He is voicing no complaints or concerns regarding his LLE ulcers 02/07/17-he is here for follow-up evaluation. He states that he noted some erythema to the left medial and anterior thigh, which he states is new as of yesterday. He is concerned about recurrent cellulitis. He states his blood sugars have been slightly elevated, this morning in the 180s 02/14/17; he is here for follow-up evaluation. When he was last here there was erythema superiorly from his posterior wound in his anterior thigh. He  was prescribed Levaquin however a culture of the wound surface grew MRSA over the phone I changed him to doxycycline on Monday and things seem to be a lot  better. 02/24/17; patient missed his appointment on Friday therefore we changed his nurse visit into a physician visit today. Still using silver alginate on the large area of the posterior left thigh. He isn't new area on the dorsal left second toe 03/03/17; actually better today although he admits he has not used his external compression pumps in the last 2 days or so because of work responsibilities over the weekend. 03/10/17; continued improvement. External compression pumps once a day almost all of his wounds have closed on the posterior left calf. Better edema control 03/17/17; in general improved. He still has 3 small open areas on the lateral aspect of his left leg however most of the area on the posterior part of his leg is epithelialized. He has better edema control. He has an ABD pad under his stocking on the right anterior lower leg although he did not let us look at that today. 03/24/17; patient arrives back in clinic today with no open areas however there are areas on the posterior left calf and anterior left calf that are less than 100% epithelialized. His edema is well controlled in the left lower leg. There is some pitting edema probably lymphedema in the left upper thigh. He uses compression pumps at home once per day. I tried to get him to do this twice a day although he is very reticent. 04/01/2017 -- for the last 2 days he's had significant redness, tenderness and weeping and came in for an urgent visit today. 04/07/17; patient still has 6 more days of doxycycline. He was seen by Dr. Meyer Russel last Wednesday for cellulitis involving the posterior aspect, lateral aspect of his Involving his heel. For the most part he is better there is less erythema and less weeping. He has been on his feet for 12 hours o2 over the weekend. Using his compression pumps once a day 04/14/17 arrives today with continued improvement. Only one area on the posterior left calf that is not fully epithelialized.  He has intense bilateral venous inflammation associated with his chronic venous insufficiency disease and secondary lymphedema. We have been using silver alginate to the left posterior calf wound In passing he tells [place] today that the right leg but we have not seen in quite some time has an open area on it but he doesn't want [place] to look at this today states he will show this to [place] next week. 04/21/17; there is no open area on his left leg although he still reports some weeping edema. He showed [place] his right leg today which is the first time we've seen this leg in a long time. He has a large area of open wound on the right leg anteriorly healthy granulation. Quite a bit of swelling in the right leg and some degree of venous inflammation. He told us about the right leg in passing last week but states that deterioration in the right leg really only happened over the weekend 04/28/17; there is no open area on the left leg although there is an irritated part on the posterior which is like a wrap injury. The wound on the right leg which was new from last week at least to [place] is a lot better. 05/05/17; still no open area on the left leg. Patient is using his new compression stocking which seems to be doing  a good job of controlling the edema. He states he is using his compression pumps once per day. The right leg still has an open wound although it is better in terms of surface area. Required debridement. A lot of pain in the posterior right Achilles marked tenderness. Usually this type of presentation this patient gives concern for an active cellulitis 05/12/17; patient arrives today with his major wound from last week on the right lateral leg somewhat better. Still requiring debridement. He was using his compression stocking on the left leg however that is reopened with superficial wounds anteriorly he did not have an open wound on this leg previously. He is still using his juxta light's once daily at night. He  cannot find the time to do this in the morning as he has to be at work by 7 AM 05/19/17; right lateral leg wound looks improved. No debridement required. The concerning area is on the left posterior leg which appears to almost have a subcutaneous hemorrhagic component to it. We've been using silver alginate to all the wounds 05/26/17; the right lateral leg wound continues to look improved. However the area on the left posterior calf is a tightly adherent surface. [place] using silver alginate. Because of the weeping edema in his legs there is very little good alternatives. 06/02/17; the patient left here last week looking quite good. Major wound on the left posterior calf and a small one on the right lateral calf. Both of these look satisfactory. He tells me that by Wednesday he had noted increased pain in the left leg and drainage. He called on Thursday and Friday to get an appointment here but we were blocked. He did not go to urgent care or his primary physician. He thinks he had a fever on Thursday but did not actually take his temperature. He has not been using his compression pumps on the left leg because of pain. I advised him to go to the emergency room today for IV antibiotics for stents of left leg cellulitis but he has refused I have asked him to take 2 days off work to keep his leg elevated and he has refused this as well. In view of this I'm going to call him and Augmentin and doxycycline. 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 However after a detailed discussion regarding the risk benefits and alternatives he refuses to get admitted to the hospital. With no other recourse I will continue him on oral antibiotics as before and hopefully he'll  have his infectious disease consultation this week. 06/16/2017 -- the patient was seen today by the nurse practitioner at infectious disease Ms. Dixon. 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 He has also been advise Lotrimin cream locally. she also had wise IV antibiotics for his condition if it flares up 06/23/17; patient arrives today with drainage bilaterally although the remaining wound on the left posterior calf after cleaning up today "highlighter yellow drainage" did not look too bad. Unfortunately he has had breakdown on the right anterior leg [previously this leg had not been  open and he is using a black stocking] he went to see infectious disease and is been put on clindamycin 150 mg daily, I did not verify the dose although I'm not familiar with using clindamycin in this dosing range, perhaps for prophylaxisoo 06/27/17; I brought this patient back today to follow-up on the wound deterioration on the right lower leg together with surrounding cellulitis. I started him on doxycycline 4 days ago. This area looks better however he comes in today with intense cellulitis on the medial part of his left thigh. This is not have a wound in this area. Extremely tender. We've been using silver alginate to the wounds on the right lower leg left lower leg with bilateral 4 layer compression he is using his external compression pumps once a day 07/04/17; patient's left medial thigh cellulitis looks better. He has not been using his compression pumps as his insert said it was contraindicated with cellulitis. His right leg continues to make improvements all the wounds are still open. We only have one remaining wound on the left posterior calf. Using silver alginate to all open areas. He is on doxycycline which I started a week  ago and should be finishing I gave him Augmentin after Thursday's visit for the severe cellulitis on the left medial thigh which fortunately looks better 07/14/17; the patient's left medial thigh cellulitis has resolved. The cellulitis in his right lower calf on the right also looks better. All of his wounds are stable to improved we've been using silver alginate he has completed the antibiotics I have given him. He has clindamycin 150 mg once a day prescribed by infectious disease for prophylaxis, I've advised him to start this now. We have been using bilateral Unna boots over silver alginate to the wound areas 07/21/17; the patient is been to see infectious disease who noted his recurrent problems with cellulitis. He was not able to tolerate prophylactic clindamycin therefore he is on amoxicillin 500 twice a day. He also had a second daily dose of Lasix added By Dr. Oneta Rack but he is not taking this. Nor is he being completely compliant with his compression pumps a especially not this week. He has 2 remaining wounds one on the right posterior lateral lower leg and one on the left posterior medial lower leg. 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 The patient has Unna boots bilaterally. 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 He has bilateral Unna boots for compression and silver alginate to his wounds. Arrives today with his legs looking as good as I have seen him in quite some time. Not surprisingly his wounds look better as well with improvement on the right lateral leg venous insufficiency wound and also the left medial leg. He is still using the compression pumps once a day 08/11/17; both legs appear to be doing better wounds on the right lateral and left medial  legs look better. Skin on the right leg quite good. He is been using silver alginate as the primary dressing. I'm going to use Anasept gel calcium alginate and maintain all the secondary dressings 08/18/17; the patient continues to actually do quite well. The area on his right lateral leg is just about closed the left medial also looks better although it is still moist in this area.  His edema is well controlled we have been using Anasept gel with calcium alginate and the usual secondary dressings, 4 layer compression and once daily use of his compression pumps "always been able to manage 09/01/17; the patient continues to do reasonably well in spite of his trip to [place]. The area on the right lateral leg is epithelialized. Left is much better but still open. He has more edema and more chronic erythema on the left leg [venous inflammation] 09/08/17; he arrives today with no open wound on the right lateral leg and decently controlled edema. Unfortunately his left leg is not nearly as in his good situation as last week.he apparently had increasing edema starting on Saturday. He edema soaked through into his foot so used a plastic bag to walk around his home. The area on the medial right leg which was his open area is about the same however he has lost surface epithelium on the left lateral which is new and he has significant pain in the Achilles area of the left foot. He is already on amoxicillin chronically for prophylaxis of cellulitis in the left leg 09/15/17; he is completed a week of doxycycline and the cellulitis in the left posterior leg and Achilles area is as usual improved. He still has a lot of edema and fluid soaking through his dressings. There is no open wound on the right leg. He saw infectious disease NP today 09/22/17;As usual 1 we transition him from our compression wraps to his stockings things did not go well. He has several small open areas on the right leg. He states this  was caused by the compression wrap on his skin although he did not wear this with the stockings over them. He has several superficial areas on the left leg medially laterally posteriorly. He does not have any evidence of active cellulitis especially involving the left Achilles The patient is traveling from [facility] Saturday going to [facility]. He states he isn't attempting to get an appointment with a heel objects wound center there to change his dressings. I am not completely certain whether this will work 10/06/17; the patient came in on Friday for a nurse visit and the nurse reported that his legs actually look quite good. He arrives in clinic today for his regular follow-up visit. He has a new wound on his left third toe over the PIP probably caused by friction with his footwear. He has small areas on the left leg and a very superficial but epithelialized area on the right anterior lateral lower leg. Other than that his legs look as good as I've seen him in quite some time. We have been using silver alginate Review of systems; no chest pain no shortness of breath other than this a 10 point review of systems negative 10/20/17; seen by Dr. Meyer Russel last week. He had taken some antibiotics [doxycycline] that he had left over. Dr. Meyer Russel thought he had candida infection and declined to give him further antibiotics. He has a small wound remaining on the right lateral leg several areas on the left leg including a larger area on the left posterior several left medial and anterior and a small wound on the left lateral. The area on the left dorsal third toe looks a lot better. ROS; Gen.; no fever, respiratory no cough no sputum Cardiac no chest pain other than this 10 point review of system is negative 10/30/17; patient arrives today having fallen in the bathtub 3 days ago. It took him a while to  get up. He has pain and maceration in the wounds on his left leg which have deteriorated. He has not been  using his pumps he also has some maceration on the right lateral leg. 11/03/17; patient continues to have weeping edema especially in the left leg. This saturates his dressings which were just put on on 12/27. As usual the doxycycline seems to take care of the cellulitis on his lower leg. He is not complaining of fever, chills, or other systemic symptoms. He states his leg feels a lot better on the doxycycline I gave him empirically. He also apparently gets injections at his primary doctor's officeo Rocephin for cellulitis prophylaxis. I didn't ask him about his compression pump compliance today I think that's probably marginal. Arrives in the clinic with all of his dressings primary and secondary macerated full of fluid and he has bilateral edema 11/10/17; the patient's right leg looks some better although there is still a cluster of wounds on the right lateral. The left leg is inflamed with almost circumferential skin loss medially to laterally although we are still maintaining anteriorly. He does not have overt cellulitis there is a lot of drainage. He is not using compression pumps. We have been using silver alginate to the wound areas, there are not a lot of options here 11/17/17; the patient's right leg continues to be stable although there is still open wounds, better than last week. The inflammation in the left leg is better. Still loss of surface layer epithelium especially posteriorly. There is no overt cellulitis in the amount of edema and his left leg is really quite good, tells me he is using his compression pumps once a day. 11/24/17; patient's right leg has a small superficial wound laterally this continues to improve. The inflammation in the left leg is still improving however we have continuous surface layer epithelial loss posteriorly. There is no overt cellulitis in the amount of edema in both legs is really quite good. He states he is using his compression pumps on the left leg  once a day for 5 out of 7 days 12/01/17; very small superficial areas on the right lateral leg continue to improve. Edema control in both legs is better today. He has continued loss of surface epithelialization and left posterior calf although I think this is better. We have been using silver alginate with large number of absorptive secondary dressings 4 layer on the left Unna boot on the right at his request. He tells me he is using his compression pumps once a day 12/08/17; he has no open area on the right leg is edema control is good here. ooOn the left leg however he has marked erythema and tenderness breakdown of skin. He has what appears to be a wrap injury just distal to the popliteal fossa. 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 There was swelling erythema and breakdown in the right leg. He has much worse skin breakdown in the left leg as well multiple open areas medially and posteriorly as well as laterally. He tells me he has been using his compression pumps but tells me he feels that the drainage out of his leg is worse when he uses a compression pumps. To be fair to  him he is been saying this for a while however I don't know that I have really been listening to this. I wonder if the compression pumps are working properly 12/22/17;. Once again he arrives with severe erythema, weeping edema from the left greater than right leg. Noncompliance with compression pumps. New this visit he is complaining of pain on the lateral aspect of the right leg and the medial aspect of his right thigh. He apparently saw his cardiologist Dr. [name] who was ordered an echocardiogram area and I think this is a step in the right  direction 12/25/17; started his doxycycline Monday night. There is still intense erythema of the right leg especially in the anterior thigh although there is less tenderness. The erythema around the wound on the right lateral calf also is less tender. He still complaining of pain in the left heel. His wounds are about the same right lateral left medial left lateral. Superficial but certainly not close to closure. He denies being systemically unwell no fever chills no abdominal pain no diarrhea 12/29/17; back in follow-up of his extensive right calf and right thigh cellulitis. I added amoxicillin to cover possible doxycycline resistant strep. This seems to of done the trick he is in much less pain there is much less erythema and swelling. He has his echocardiogram at 11:00 this morning. X-ray of the left heel was also negative. 01/05/18; the patient arrived with his edema under much better control. Now that he is retired he is able to use his compression pumps daily and sometimes twice a day per the patient. He has a wound on the right leg the lateral wound looks better. Area on the left leg also looks a lot better. He has no evidence of cellulitis in his bilateral thighs I had a quick peak at his echocardiogram. He is in normal ejection fraction and normal left ventricular function. He has moderate pulmonary hypertension moderately reduced right ventricular function. One would have to wonder about chronic sleep apnea although he says he doesn't snore. He'll review the echocardiogram with his cardiologist. 01/12/18; the patient arrives with the edema in both legs under exemplary control. He is using his compression pumps daily and sometimes twice daily. His wound on the right lateral leg is just about closed. He still has some weeping areas on the posterior left calf and lateral left calf although everything is just about closed here as well. I have spoken with Aldean Baker who is the patient's  nurse practitioner and infectious disease. She was concerned that the patient had not understood that the parenteral penicillin injections he was receiving for cellulitis prophylaxis was actually benefiting him. I don't think the patient actually saw that I would tend to agree we were certainly dealing with less infections although he had a serious one last month. 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 He is healed. He'll be placed in juxtalite compression wraps and increase his lymphedema pumps to twice daily. We will follow up again next week to ensure there are no issues with the new regiment. 01/20/18-he is here for evaluation of bilateral lower extremity weeping edema. Yesterday he was placed in compression wrap to the right lower extremity and compression stocking to left lower shrubbery. He states he uses lymphedema pumps last night and again this morning and noted a blister to the left lower extremity. On exam he was noted to have drainage to the right lower extremity. He will be placed in [employer]  bilaterally and follow-up next week 01/26/18; patient was actually discharged a week ago to his own juxta light stockings only to return the next day with bilateral lower extremity weeping edema.he was placed in bilateral Unna boots. He arrives today with pain in the back of his left leg. There is no open area on the right leg however there is a linear/wrap injury on the left leg and weeping edema on the left leg posteriorly. I spoke with infectious disease about 10 days ago. They were disappointed that the patient elected to discontinue prophylactic intramuscular penicillin shots as they felt it was particularly beneficial in reducing the frequency of his cellulitis. I discussed this with the patient today. He does not share this view. He'll definitely need antibiotics today. Finally he is traveling to [place] and trauma leaving this Saturday and  returning a week later and he does not travel with his pumps. He is going by car 01/30/18; patient was seen 4 days ago and brought back in today for review of cellulitis in the left leg posteriorly. I put him on amoxicillin this really hasn't helped as much as I might like. He is also worried because he is traveling to [facility] trauma by car. Finally we will be rewrapping him. There is no open area on the right leg over his left leg has multiple weeping areas as usual 02/09/18; The same wrap on for 10 days. He did not pick up the last doxycycline I prescribed for him. He apparently took 4 days worth he already had. There is nothing open on his right leg and the edema control is really quite good. He's had damage in the left leg medially and laterally especially probably related to the prolonged use of Unna boots 02/12/18; the patient arrived in clinic today for a nurse visit/wrap change. He complained of a lot of pain in the left posterior calf. He is taking doxycycline that I previously prescribed for him. Unfortunately even though he used his stockings and apparently used to compression pumps twice a day he has weeping edema coming out of the lateral part of his right leg. This is coming from the lower anterior lateral skin area. 02/16/18; the patient has finished his doxycycline and will finish the amoxicillin 2 days. The area of cellulitis in the left calf posteriorly has resolved. He is no longer having any pain. He tells me he is using his compression pumps at least once a day sometimes twice. 02/23/18; the patient finished his doxycycline and Amoxil last week. On Friday he noticed a small erythematous circle about the size of a quarter on the left lower leg just above his ankle. This rapidly expanded and he now has erythema on the lateral and posterior part of the thigh. This is bright red. Also has an area on the dorsal foot just above his toes and a tender area just below the left popliteal  fossa. He came off his prophylactic penicillin injections at his own insistence one or 2 months ago. This is obviously deteriorated since then 03/02/18; patient is on doxycycline and Amoxil. Culture I did last week of the weeping area on the back of his left calf grew group B strep. I have therefore renewed the amoxicillin 500 3 times a day for a further week. He has not been systemically unwell. Still complaining of an area of discomfort right under his left popliteal fossa. There is no open wound on the right leg. He tells me that he is using his pumps twice  a day on most days 03/09/18; patient arrives in clinic today completing his amoxicillin today. The cellulitis on his left leg is better. Furthermore he tells me that he had intramuscular penicillin shots that his primary care office today. However he also states that the wrap on his right leg fell down shortly after leaving clinic last week. He developed a large blister that was present when he came in for a nurse visit later in the week and then he developed intense discomfort around this area.He tells me he is using his compression pumps 03/16/18; the patient has completed his doxycycline. The infectious part of this/cellulitis in the left heel area left popliteal area is a lot better. He has 2 open areas on the right calf. Still areas on the left calf but this is a lot better as well. 03/24/18; the patient arrives complaining of pain in the left popliteal area again. He thinks some of this is wrap injury. He has no open area on the right leg and really no open area on the left calf either except for the popliteal area. He claims to be compliant with the compression pumps 03/31/18; I gave him doxycycline last week because of cellulitis in the left popliteal area. This is a lot better although the surface epithelium is denuded off and response to this. He arrives today with uncontrolled edema in the right calf area as well as a fingernail injury  in the right lateral calf. There is only a few open areas on the left 04/06/18; I gave him amoxicillin doxycycline over the last 2 [name] that the amoxicillin should be completing currently. He is not complaining of any pain or systemic symptoms. The only open areas see has is on the right lateral lower leg paradoxically I cannot see anything on the left lower leg. He tells me he is using his compression pumps twice a day on most days. Silver alginate to the wounds that are open under 4 layer compression 04/13/18; he completed antibiotics and has no new complaints. Using his compression pumps. Silver alginate that anything that's opened 04/20/18; he is using his compression pumps religiously. Silver alginate 4 layer compression anything that's opened. He comes in today with no open wounds on the left leg but 3 on the right including a new one posteriorly. He has 2 on the right lateral and one on the right posterior. He likes Unna boots on the right leg for reasons that aren't really clear we had the usual 4 layer compression on the left. It may be necessary to move to the 4 layer compression on the right however for now I left them in the Unna boots 04/27/18; he is using his compression pumps at least once a day. He has still the wounds on the right lateral calf. The area right posteriorly has closed. He does not have an open wound on the left under 4 layer compression however on the dorsal left foot just proximal to the toes and the left third toe 2 small open areas were identified 05/11/18; he has not uses compression pumps. The areas on the right lateral calf have coalesced into one large wound necrotic surface. On the left side he has one small wound anteriorly however the edema is now weeping out of a large part of his left leg. He says he wasn't using his pumps because of the weeping fluid. I explained to him that this is the time he needs to pump more 05/18/18; patient states he is using his  compression pumps twice a day. The area on the right lateral large wound albeit superficial. On the left side he has innumerable number of small new wounds on the left calf particularly laterally but several anteriorly and medially. All these appear to have healthy granulated base these look like the remnants of blisters however they occurred under compression. The patient arrives in clinic today with his legs somewhat better. There is certainly less edema, less multiple open areas on the left calf and the right anterior leg looks somewhat better as well superficial and a little smaller. However he relates pain and erythema over the last 3-4 days in the thigh and I looked at this today. He has not been systemically unwell no fever no chills no change in blood sugar values 05/25/18; comes in today in a better state. The severe cellulitis on his left leg seems better with the Keflex. Not as tender. He has not been systemically unwell ooHard to find an open wound on the left lower leg using his compression pumps twice a day ooThe confluent wounds on his right lateral calf somewhat better looking. These will ultimately need debridement I didn't do this today. 06/01/18; the severe cellulitis on the left anterior thigh has resolved and he is completed his Keflex. ooThere is no open wound on the left leg however there is a superficial excoriation at the base of the third toe dorsally. Skin on the bottom of his left foot is macerated looking. ooThe left the wounds on the lateral right leg actually looks some better although he did require debridement of the top half of this wound area with an open curet 06/09/18 on evaluation today patient appears to be doing poorly in regard to his right lower extremity in particular this appears to likely be infected he has very thick purulent discharge along with a bright green tent to the discharge. This makes me concerned about the possibility of pseudomonas. He's  also having increased discomfort at this point on evaluation. Fortunately there does not appear to be any evidence of infection spreading to the other location at this time. 06/16/18 on evaluation today patient appears to actually be doing fairly well. His ulcer has actually diminished in size quite significantly at this point which is good news. Nonetheless he still does have some evidence of infection he did see infectious disease this morning before coming here for his appointment. I did review the results of their evaluation and their note today. They did actually have him discontinue the Cipro and initiate treatment with linezolid at this time. He is doing this for the next seven days and they recommended a follow-up in four months with them. He is the keep a log of the need for intermittent antibiotic therapy between now and when he falls back up with infectious disease. This will help them gaze what exactly they need to do to try and help them out. 06/23/18; the patient arrives today with no open wounds on the left leg and left third toe healed. He is been using his compression pumps twice a day. On the right lateral leg he still has a sizable wound but this is a lot better than last time I saw this. 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 Has been using silver alginate under 4 layer compression 06/30/18; the only open wound he has is on the right lateral leg and this looks  healthy. No debridement is required. We have been using silver alginate. He does not have an open wound on the left leg. There is apparently some drainage from the dorsal proximal third toe on the left although I see no open wound here. 07/03/18 on evaluation today patient was actually here just for a nurse visit rapid change. However when he was here on Wednesday for his rat change due to having been healed on the  left and then developing blisters we initiated the wrap again knowing that he would be back today for [place] to reevaluate and see were at. Unfortunately he has developed some cellulitis into the proximal portion of his right lower extremity even into the region of his thigh. 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 He was placed on one as what at that point. Nonetheless he is done with that and has been tolerating it well otherwise. Doxycycline which in the past really did not seem to be effective for him. Nonetheless I think the best option may be for [place] to definitely reinitiate the antibiotics for a longer period of time. 07/07/18; since I last saw this patient a week ago he has had a difficult time. At that point he did not have an open wound on his left leg. We transitioned him into juxta light stockings. He was apparently in the clinic the next day with blisters on the left lateral and left medial lower calf. He also had weeping edema fluid. He was put back into a compression wrap. He was also in the clinic on Friday with intense erythema in his right thigh. Per the patient he was started on Bactrim however that didn't work at all in terms of relieving his pain and swelling. He has taken 3 doxycycline that he had left over from last time and that seems to of helped. He has blistering on the right thigh as well. 07/14/18; the erythema on his right thigh has gotten better with doxycycline that he is finishing. 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 Presumably this cellulitis in the thigh was not related to that although I think this is a bit concerning going forward. He still has an area on the right lateral calf the blister on the right medial calf just below the knee that was discussed above. On the left 2 small open areas left medial and left lateral. Edema control is  adequate. He is using his compression pumps twice a day 07/20/18; continued improvement in the condition of both legs especially the edema in his bilateral thighs. He tells me he is been losing weight through a combination of diet and exercise. He is using his compression pumps twice a day. So overall she made to the remaining wounds 07/27/2018; continued improvement in condition of both legs. His edema is well controlled. The area on the right lateral leg is just about closed he had one blisters show up on the medial left upper calf. We have him in 4 layer compression. He is going on a 10-day trip to [place], [place] and [place]. He will be driving. He wants to wear Unna boots because of the lessening amount of constriction. He will not use compression pumps while he is away 08/05/18 on evaluation today patient actually appears to be doing decently well all things considered in regard to his bilateral lower extremities. The worst ulcer  is actually only posterior aspect of his left lower extremity with a four layer compression wrap cut into his leg a couple [name] back. He did have a trip and actually had [employer] for the trip that he is worn since he was last here. Nonetheless he feels like the [employer] actually do better for him his swelling is up a little bit but he also with his trip was not taking his Lasix on a regular set schedule like he was supposed to be. He states that obviously the reason being that he cannot drive and keep going without having to urinate too frequently which makes it difficult. He did not have his pumps with him while he was away either which I think also maybe playing a role here too. 08/13/2018; the patient only has a small open wound on the right lateral calf which is a big improvement in the last month or 2. He also has the area posteriorly just below the posterior fossa on the left which I think was a wrap injury from several [name] ago. He has  no current evidence of cellulitis. He tells me he is back into his compression pumps twice a day. He also tells me that while he was at the laundromat somebody stole a section of his extremitease stockings 08/20/2018; back in the clinic with a much improved state. He only has small areas on the right lateral mid calf which is just about healed. This was is more substantial area for quite a prolonged period of time. He has a small open area on the left anterior tibia. The area on the posterior calf just below the popliteal fossa is closed today. He is using his compression pumps twice a day 08/28/2018; patient has no open wound on the right leg. He has a smattering of open areas on the calf with some weeping lymphedema. More problematically than that it looks as though his wraps of slipped down in his usual he has very angry upper area of edema just below the right medial knee and on the right lateral calf. He has no open area on his feet. The patient is traveling to [facility] next week. I will send him in an antibiotic. We will continue to wrap the right leg. We ordered extremitease stockings for him last week and I plan to transition the right leg to a stocking when he gets home which will be in 10 days time. As usual he is very reluctant to take his pumps with him when he travels 09/07/2018; patient returns from [facility]. He shows me a picture of his left leg in the mid part of his trip last week with intense fire engine erythema. The picture look bad enough I would have considered sending him to the hospital. Instead he went to the wound care center in [facility]. They did not prescribe him antibiotics but he did take some doxycycline he had leftover from a previous visit. I had given him trimethoprim sulfamethoxazole before he left this did not work according to the patient. This is resulted in some improvement fortunately. He comes back with a large wound on the left  posterior calf. Smaller area on the left anterior tibia. Denuded blisters on the dorsal left foot over his toes. Does not have much in the way of wounds on the right leg although he does have a very tender area on the right posterior area just below the popliteal fossa also suggestive of infection. He  promises me he is back on his pumps twice a day 09/15/2018; the intense cellulitis in his left lower calf is a lot better. The wound area on the posterior left calf is also so better. However he has reasonably extensive wounds on the dorsal aspect of his second and third toes and the proximal foot just at the base of the toes. There is nothing open on the right leg 09/22/2018; the patient has excellent edema control in his legs bilaterally. He is using his external compression pumps twice a day. He has no open area on the right leg and only the areas in the left foot dorsally second and third toe area on the left side. He does not have any signs of active cellulitis. 10/06/2018; the patient has good edema control bilaterally. He has no open wound on the right leg. There is a blister in the posterior aspect of his left calf that we had to deal with today. He is using his compression pumps twice a day. There is no signs of active cellulitis. We have been using silver alginate to the wound areas. He still has vulnerable areas on the base of his left first second toes dorsally He has a his extremities stockings and we are going to transition him today into the stocking on the right leg. He is cautioned that he will need to continue to use the compression pumps twice a day. If he notices uncontrolled edema in the right leg he may need to go to 3 times a day. 10/13/2018; the patient came in for a nurse check on Friday he has a large flaccid blister on the right medial calf just below the knee. We unroofed this. He has this and a new area underneath the posterior mid calf which was undoubtedly a blister as  well. He also has several small areas on the right which is the area we put his extremities stocking on. 10/19/2018; the patient went to see infectious disease this morning I am not sure if that was a routine follow-up in any case the doxycycline I had given him was discontinued and started on linezolid. He has not started this. It is easy to look at his left calf and the inflammation and think this is cellulitis however he is very tender in the tissue just below the popliteal fossa and I have no doubt that there is infection going on here. He states the problem he is having is that with the compression pumps the edema goes down and then starts walking the wrap falls down. We will see if we can adhere this. He has 1 or 2 minuscule open areas on the right still areas that are weeping on the posterior left calf, the base of his left second and third toes 10/26/18; back today in clinic with quite of skin breakdown in his left anterior leg. This may have been infection the area below the popliteal fossa seems a lot better however tremendous epithelial loss on the left anterior mid tibia area over quite inexpensive tissue. He has 2 blisters on the right side but no other open wound here. 10/29/2018; came in urgently to see [place] today and we worked him in for review. He states that the 4 layer compression on the right leg caused pain he had to cut it down to roughly his mid calf this caused swelling above the wrap and he has blisters and skin breakdown today. As a result of the pain he has not been using his pumps.  Both legs are a lot more edematous and there is a lot of weeping fluid. 11/02/18; arrives in clinic with continued difficulties in the right leg> left. Leg is swollen and painful. multiple skin blisters and new open areas especially laterally. He has not been using his pumps on the right leg. 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 He is not  systemically unwell. 11/09/2018; the patient claims he is being compliant with his pumps. He is finished the doxycycline I gave him last week. Culture I did of the wound on the right lateral leg showed a few very resistant methicillin staph aureus. This was resistant to doxycycline. Nevertheless he states the pain in the leg is a lot better which makes me wonder if the cultured organism was not really what was causing the problem nevertheless this is a very dangerous organism to be culturing out of any wound. His right leg is still a lot larger than the left. He is using an Radio broadcast assistant on this area, he blames a 4-layer compression for causing the original skin breakdown which I doubt is true however I cannot talk him out of it. We have been using silver alginate to all of these areas which were initially blisters 11/16/2018; patient is being compliant with his external compression pumps at twice a day. Miraculously he arrives in clinic today with absolutely no open wounds. He has better edema control on the left where he has been using 4 layer compression versus wound of wounds on the right and I pointed this out to him. There is no inflammation in the skin in his lower legs which is also somewhat unusual for him. There is no open wounds on the dorsal left foot. He has extremitease stockings at home and I have asked him to bring these in next week. 11/25/18 patient's lower extremity on examination today on the left appears for the most part to be wound free. He does have an open wound on the lateral aspect of the right lower extremity but this is minimal compared to what I've seen in past. He does request that we go ahead and wrap the left leg as well even though there's nothing open just so hopefully it will not reopen in short order. 1/28; patient has superficial open wounds on the right lateral calf left anterior calf and left posterior calf. His edema control is adequate. He has an area of very  tender erythematous skin at the superior upper part of his calf compatible with his recurrent cellulitis. We have been using silver alginate as the primary dressing. He claims compliance with his compression pumps 2/4; patient has superficial open wounds on numerous areas of his left calf and again one on the left dorsal foot. The areas on the right lateral calf have healed. The cellulitis that I gave him doxycycline for last week is also resolved this was mostly on the left anterior calf just below the tibial tuberosity. His edema looks fairly well-controlled. He tells me he went to see his primary doctor today and had blood work ordered 2/11; once again he has several open areas on the left calf left tibial area. Most of these are small and appear to have healthy granulation. He does not have anything open on the right. The edema and control in his thighs is pretty good which is usually a good indication he has been using his pumps as requested. 2/18; he continues to have several small areas on  the left calf and left tibial area. Most of these are small healthy granulation. We put him in his stocking on the right leg last week and he arrives with a superficial open area over the right upper tibia and a fairly large area on the right lateral tibia in similar condition. His edema control actually does not look too bad, he claims to be using his compression pumps twice a day 2/25. Continued small areas on the left calf and left tibial area. New areas especially on the right are identified just below the tibial tuberosity and on the right upper tibia itself. There are also areas of weeping edema fluid even without an obvious wound. He does not have a considerable degree of lymphedema but clearly there is more edema here than his skin can handle. He states he is using the pumps twice a day. We have an Unna boot on the right and 4 layer compression on the left. 3/3; he continues to have an area on the  right lateral calf and right posterior calf just below the popliteal fossa. There is a fair amount of tenderness around the wound on the popliteal fossa but I did not see any evidence of cellulitis, could just be that the wrap came down and rubbed in this area. ooHe does not have an open area on the left leg however there is an area on the left dorsal foot at the base of the third toe ooWe have been using silver alginate to all wound areas 3/10; he did not have an open area on his left leg last time he was here a week ago. Today he arrives with a horizontal wound just below the tibial tuberosity and an area on the left lateral calf. He has intense erythema and tenderness in this area. The area is on the right lateral calf and right posterior calf better than last week. We have been using silver alginate as usual 3/18 - Patient returns with 3 small open areas on left calf, and 1 small open area on right calf, the skin looks ok with no significant erythema, he continues the UNA boot on right and 4 layer compression on left. The right lateral calf wound is closed , the right posterior is small area. we will continue silver alginate to the areas. 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 He is been tolerating the dressing changes without complication. Fortunately she has made excellent improvement in regard to the overall status of his wounds. Unfortunately every time we cease wrapping him he ends up reopening in causing more significant issues at that point. Again I'm unsure of the best direction to take although I think the lymphedema clinic may be appropriate for him. 02/03/19 on evaluation today patient appears to be doing well in regard to the wounds that we saw him for last week unfortunately he has  a new area on the proximal portion of his right medial/posterior lower extremity where the wrap somewhat slowed down and caused swelling and a blister to rub and open. Unfortunately this is the only opening that he has on either leg at this point. 02/17/19 on evaluation today patient's bilateral lower extremities appear to be doing well. He still completely healed in regard to the left lower extremity. In regard to the right lower extremity the area  where the wrap and slid down and caused the blister still seems to be slightly open although this is dramatically better than during the last evaluation two [name] ago. I'm very pleased with the way this stands overall. 03/03/19 on evaluation today patient appears to be doing well in regard to his right lower extremity in general although he did have a new blister open this does not appear to be showing any evidence of active infection at this time. Fortunately there's No fevers, chills, nausea, or vomiting noted at this time. Overall I feel like he is making good progress it does feel like that the right leg will we perform the [employer] seems to do with a bit better than three layer wrap on the left which slid down on him. We may switch to doing bilateral in the book wraps. 5/4; I have not seen [name] in quite some time. According to our case manager he did not have an open wound on his left leg last week. He had 1 remaining wound on the right posterior medial calf. He arrives today with multiple openings on the left leg probably were blisters and/or wrap injuries from Unna boots. I do not think the Unna boot's will provide adequate compression on the left. I am also not clear about the frequency he is using the compression pumps. 03/17/19 on evaluation today patient appears to be doing excellent in regard to his lower extremities compared to last week's evaluation apparently. He had gotten significantly worse last week which is unfortunate. The [employer] wrap on the left did not seem to do very well for him at all and in fact it didn't control his swelling significantly enough he had an additional outbreak. Subsequently we go back to the four layer compression wrap on the left. This is good news. At least in that he is doing better and the wound seem to be killing him. He still has not heard anything from the lymphedema clinic. 03/24/19 on evaluation today patient actually appears to be doing much better in regard to his bilateral lower Trinity as compared to last week when I saw him. Fortunately there's no signs of active infection at this time. He has been tolerating the dressing changes without complication. Overall I'm extremely pleased with the progress and appearance in general. 04/07/19 on evaluation today patient appears to be doing well in regard to his bilateral lower extremities. His swelling is significantly down from where it was previous. With that being said he does have a couple blisters still open at this point but fortunately nothing that seems to be too severe and again the majority of the larger openings has healed at this time. 04/14/19 on evaluation today patient actually appears to be doing quite well in regard to his bilateral lower extremities in fact I'm not even sure there's anything significantly open at this time at any site. Nonetheless he did have some trouble with these wraps where they are somewhat irritating him secondary to the fact that he has noted that the graph wasn't too close down to the end of this foot in a little bit short as well up to his knee. Otherwise things seem to be doing quite well. 04/21/19 upon evaluation today patient's wound bed actually showed evidence of being completely healed in regard to both lower extremities which is excellent news. There does not appear to be any signs of active infection which is also good news. I'm very pleased in this regard. No fevers,  chills, nausea, or  vomiting noted at this time. 04/28/19 on evaluation today patient appears to be doing a little bit worse in regard to both lower extremities on the left mainly due to the fact that when he went infection disease the wrap was not wrapped quite high enough he developed a blister above this. On the right he is a small open area of nothing too significant but again this is continuing to give him some trouble he has been were in the Velcro compression that he has at home. 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 He's been tolerating the dressing changes without complication. Fortunately there's no signs of active infection at this time. No fevers, chills, nausea, or vomiting noted at this time. We have been trying to get an appointment with her lymphedema clinic in [facility] but unfortunately nobody can get them on phone with not been able to even fax information over the patient likewise is not been able to get in touch with them. Overall I'm not sure exactly what's going on here with to reach out again today. 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 Still having a lot of drainage unfortunately. He tells me especially in the left but even on the right. There's no signs of active infection which is good news we've been using so ratcheted up to this point. 05/19/19 on evaluation today patient actually appears to be doing quite well with regard to his left lower extremity which is great news. Fortunately in regard to the right lower extremity has an issues with his wrap and he subsequently did remove this from what I'm understanding. Nonetheless long story short is what he had rewrapped once he removed it subsequently had maggots underneath this wrap whenever he came in for evaluation today. With that being said they were obviously  completely cleaned away by the nursing staff. The visit today which is excellent news. However he does appear to potentially have some infection around the right ankle region where the maggots were located as well. He will likely require anabiotic therapy today. 05/26/19 on evaluation today patient actually appears to be doing much better in regard to his bilateral lower extremities. I feel like the infection is under much better control. With that being said there were maggots noted when the wrap was removed yet again today. Again this could have potentially been left over from previous although at this time there does not appear to be any signs of significant drainage there was obviously on the wrap some drainage as well this contracted gnats or otherwise. Either way I do not see anything that appears to be doing worse in my pinion and in fact I think his drainage has slowed down quite significantly likely mainly due to the fact to his infection being under better control. 06/02/2019 on evaluation today patient actually appears to be doing well with regard to his bilateral lower extremities there is no signs of active infection at this time which is great news. With that being said he does have several open areas more so on the right than the left but nonetheless these are all significantly better than previously noted. 06/09/2019 on evaluation today patient actually appears to be doing well. His wrap stayed up and he did not cause any problems he had more drainage on the right compared to the left but  overall I do not see any major issues at this time which is great news. 06/16/2019 on evaluation today patient appears to be doing excellent with regard to his lower extremities the only area that is open is a new blister that can have opened as of today on the medial ankle on the left. Other than this he really seems to be doing great I see no major issues at this point. 06/23/2019 on evaluation today  patient appears to be doing quite well with regard to his bilateral lower extremities. In fact he actually appears to be almost completely healed there is a small area of weeping noted of the right lower extremity just above the ankle. Nonetheless fortunately there is no signs of active infection at this time which is good news. No fevers, chills, nausea, vomiting, or diarrhea. 8/24; the patient arrived for a nurse visit today but complained of very significant pain in the left leg and therefore I was asked to look at this. Noted that he did not have an open area on the left leg last week nevertheless this was wrapped. The patient states that he is not been able to put his compression pumps on the left leg because of the discomfort. He has not been systemically unwell 06/30/2019 on evaluation today patient unfortunately despite being excellent last week is doing much worse with regard to his left lower extremity today. In fact he had to come in for a nurse on Monday where his left leg had to be rewrapped due to excessive weeping Dr. Leanord Hawking placed him on doxycycline at that point. Fortunately there is no signs of active infection Systemically at this time which is good news. 07/07/2019 in regard to the patient's wounds today he actually seems to be doing well with his right lower extremity there really is nothing open or draining at this point this is great news. Unfortunately the left lower extremity is given him additional trouble at this time. There does not appear to be any signs of active infection nonetheless he does have a lot of edema and swelling noted at this point as well as blistering all of which has led to a much more poor appearing leg at this time compared to where it was 2 [name] ago when it was almost completely healed. Obviously this is a little discouraging for the patient. He is try to contact the lymphedema clinic in [place] he has not been able to get through to  them. 07/14/2019 on evaluation today patient actually appears to be doing slightly better with regard to his left lower extremity ulcers. Overall I do feel like at least at the top of the wrap that we have been placing this area has healed quite nicely and looks much better. The remainder of the leg is showing signs of improvement. Unfortunately in the thigh area he still has an open region on the left and again on the right he has been utilizing just a Band-Aid on an area that also opened on the thigh. Again this is an area that were not able to wrap although we did do an Ace wrap to provide some compression that something that obviously is a little less effective than the compression wraps we have been using on the lower portion of the leg. He does have an appointment with the lymphedema clinic in [facility] on Friday. 07/21/2019 on evaluation today patient appears to be doing better with regard to his lower extremity ulcers. He has been tolerating the dressing changes  without complication. Fortunately there is no signs of active infection at this time. No fevers, chills, nausea, vomiting, or diarrhea. I did receive the paperwork from the physical therapist at the lymphedema clinic in [place]. Subsequently I signed off on that this morning and sent that back to him for further progression with the treatment plan. 07/28/2019 on evaluation today patient appears to be doing very well with regard to his right lower extremity where I do not see any open wounds at this point. Fortunately he is feeling great as far as that is concerned as well. In regard to the left lower extremity he has been having issues with still several areas of weeping and edema although the upper leg is doing better his lower leg still I think is going require the compression wrap at this time. No fevers, chills, nausea, vomiting, or diarrhea. 08/04/2019 on evaluation today patient unfortunately is having new wounds on the  right lower extremity. Again we have been using Unna boot wrap on that side. We switched him to using his juxta lite wrap at home. With that being said he tells me he has been using it although his legs extremely swollen and to be honest really does not appear that he has been. I cannot know that for sure however. Nonetheless he has multiple new wounds on the right lower extremity at this time. Obviously we will have to see about getting this rewrapped for him today. 08/11/2019 on evaluation today patient appears to be doing fairly well with regard to his wounds. He has been tolerating the dressing changes including the compression wraps without complication. He still has a lot of edema in his upper thigh regions bilaterally he is supposed to be seeing the lymphedema clinic on the 15th of this month once his wraps arrive for the upper part of his legs. 08/18/2019 on evaluation today patient appears to be doing well with regard to his bilateral lower extremities at this point. He has been tolerating the dressing changes without complication. Fortunately there is no signs of active infection which is also good news. He does have a couple weeping areas on the first and second toe of the right foot he also has just a small area on the left foot upper leg and a small area on the left lower leg but overall he is doing quite well in my opinion. He is supposed to be getting his wraps shortly in fact tomorrow and then subsequently is seeing the lymphedema clinic next Wednesday on the 21st. Of note he is also leaving on the 25th to go on vacation for a week to the beach. For that reason and since there is some uncertainty about what there can be doing at lymphedema clinic next Wednesday I am get a make an appointment for next Friday here for [place] to see what we need to do for him prior to him leaving for vacation. 10/23; patient arrives in considerable pain predominantly in the upper posterior calf just distal  to the popliteal fossa also in the wound anteriorly above the major wound. This is probably cellulitis and he has had this recurrently in the past. He has no open wound on the right side and he has had an Radio broadcast assistant in that area. Finally I note that he has an area on the left posterior calf which by enlarge is mostly epithelialized. This protrudes beyond the borders of the surrounding skin in the setting of dry scaly skin and lymphedema. The patient is leaving  for [facility] on Sunday. Per his longstanding pattern, he will not take his compression pumps with him predominantly out of fear that they will be stolen. He therefore asked that we put a Unna boot back on the right leg. He will also contact the wound care center in [facility] to see if they can change his dressing in the mid week. 11/3; patient returned from his vacation to [facility]. He was seen on 1 occasion at their wound care center. They did a 2 layer compression system as they did not have our 4-layer wrap. I am not completely certain what they put on the wounds. They did not change the Unna boot on the right. The patient is also seeing a lymphedema specialist physical therapist in [place]. It appears that he has some compression sleeve for his thighs which indeed look quite a bit better than I am used to seeing. He pumps over these with his external compression pumps. 11/10; the patient has a new wound on the right medial thigh otherwise there is no open areas on the right. He has an area on the left leg posteriorly anteriorly and medially and an area over the left second toe. We have been using silver alginate. He thinks the injury on his thigh is secondary to friction from the compression sleeve he has. 11/17; the patient has a new wound on the right medial thigh last week. He thinks this is because he did not have a underlying stocking for his thigh juxta lite apparatus. He now has this. The area is fairly large and  somewhat angry but I do not think he has underlying cellulitis. ooHe has a intact blister on the right anterior tibial area. ooSmall wound on the right great toe dorsally ooSmall area on the medial left calf. 11/30; the patient does not have any open areas on his right leg and we did not take his juxta lite stocking off. However he states that on Friday his compression wrap fell down lodging around his upper mid calf area. As usual this creates a lot of problems for him. He called urgently today to be seen for a nurse visit however the nurse visit turned into a provider visit because of extreme erythema and pain in the left anterior tibia extending laterally and posteriorly. The area that is problematic is extensive 10/06/2019 upon evaluation today patient actually appears to be doing poorly in regard to his left lower extremity. He Dr. Leanord Hawking did place him on doxycycline this past Monday apparently due to the fact that he was doing much worse in regard to this left leg. Fortunately the doxycycline does seem to be helping. Unfortunately we are still having a very difficult time getting his edema under any type of control in order to anticipate discharge at some point. The only way were really able to control his lymphedema really is with compression wraps and that has only even seemingly temporary. He has been seeing a lymphedema clinic they are trying to help in this regard but still this has been somewhat frustrating in general for the patient. 10/13/19 on evaluation today patient appears to be doing excellent with regard to his right lower extremity as far as the wounds are concerned. His swelling is still quite extensive unfortunately. He is still having a lot of drainage from the thigh areas bilaterally which is unfortunate. He's been going to lymphedema clinic but again he still really does not have this edema under control as far as his lower  extremities are concern. With regard to his  left lower extremity this seems to be improving and I do believe the doxycycline has been of benefit for him. He is about to complete the doxycycline. 10/20/2019 on evaluation today patient appears to be doing poorly in regard to his bilateral lower extremities. More in the right thigh he has a lot of irritation at this site unfortunately. In regard to the left lower extremity the wrap was not quite as high it appears and does seem to have caused him some trouble as well. Fortunately there is no evidence of systemic infection though he does have some blue-green drainage which has me concerned for the possibility of Pseudomonas. He tells me he is previously taking Cipro without complications and he really does not care for Levaquin however due to some of the side effects he has. He is not allergic to any medications specifically antibiotics that were aware of. 10/27/2019 on evaluation today patient actually does appear to be for the most part doing better when compared to last week's evaluation. With that being said he still has multiple open wounds over the bilateral lower extremities. He actually forgot to start taking the Cipro and states that he still has the whole bottle. He does have several new blisters on left lower extremity today I think I would recommend he go ahead and take the Cipro based on what I am seeing at this point. 12/30-Patient comes at 1 week visit, 4 layer compression wraps on the left and Unna boot on the right, primary dressing Xtrasorb and silver alginate. Patient is taking his Cipro and has a few more days left probably 5-6, and the legs are doing better. He states he is using his compressions devices which I believe he has 11/10/2019 on evaluation today patient actually appears to be much better than last time I saw him 2 [name] ago. His wounds are significantly improved and overall I am very pleased in this regard. Fortunately there is no signs of active infection at  this time. He is just a couple of days away from completing Cipro. Overall his edema is much better he has been using his lymphedema pumps which I think is also helping at this point. 11/17/2019 on evaluation today patient appears to be doing excellent in regard to his wounds in general. His legs are swollen but not nearly as much as they have been in the past. Fortunately he is tolerating the compression wraps without complication. No fevers, chills, nausea, vomiting, or diarrhea. He does have some erythema however in the distal portion of his right lower extremity specifically around the forefoot and toes there is a little bit of warmth here as well. 11/24/2019 on evaluation today patient appears to be doing well with regard to his right lower extremity I really do not see any open wounds at this point. His left lower extremity does have several open areas and his right medial thigh also is open. Other than this however overall the patient seems to be making good progress and I am very pleased at this point. 12/01/2019 on evaluation today patient appears to be doing poorly at this point in regard to his left lower extremity has several new blisters despite the fact that we have him in compression wraps. In fact he had a 4-layer compression wrap, his upper thigh wrapped from lymphedema clinic, and a juxta light over top of the 4 layer compression wrap the lymphedema clinic applied and despite all this he still develop  blisters underneath. Obviously this does have me concerned about the fact that unfortunately despite what we are doing to try to get wounds healed he continues to have new areas arise I do not think he is ever good to be at the point where he can realistically just use wraps at home to keep things under control. Typically when we heal him it takes about 1-2 days before he is back in the clinic with severe breakdown and blistering of his lower extremities bilaterally. This is happened  numerous times in the past. Unfortunately I think that we may need some help as far as overall fluid overload to kind of limit what we are seeing and get things under better control. 12/08/2019 on evaluation today patient presents for follow-up concerning his ongoing bilateral lower extremity edema. Unfortunately he is still having quite a bit of swelling the compression wraps are controlling this to some degree but he did see Dr. [name] his cardiologist I do have that available for review today as far as the appointment was concerned that was on 12/06/2019. Obviously that she has been 2 days ago. The patient states that he is only been taking the Lasix 80 mg 1 time a day he had told me previously he was taking this twice a day. Nonetheless Dr. [name] recommended this be up to 80 mg 2 times a day for the patient as he did appear to be fluid overloaded. With that being said the patient states he did this yesterday and he was unable to go anywhere or do anything due to the fact that he was constantly having to urinate. Nonetheless I think that this is still good to be something that is important for him as far as trying to get his edema under control at all things that he is going to be able to just expect his wounds to get under control and things to be better without going through at least a period of time where he is trying to stabilize his fluid management in general and I think increasing the Lasix is likely the first step here. It was also mentioned the possibility that the patient may require metolazone. With that being said he wanted to have the patient take Lasix twice a day first and then reevaluating 2 months to see where things stand. 12/15/2019 upon evaluation today patient appears to be doing regard to his legs although his toes are showing some signs of weeping especially on the left at this point to some degree on the right. There does not appear to be any signs of active infection and  overall I do feel like the compression wraps are doing well for him but he has not been able to take the Lasix at home and the increased dose that Dr. [name] recommended. He tells me that just not go to be feasible for him. Nonetheless I think in this case he should probably send a message to Dr. [name] in order to discuss options from the standpoint of possible admission to get the fluid off or otherwise going forward. 12/22/2019 upon evaluation today patient appears to be doing fairly well with regard to his lower extremities at this point. In fact he would be doing excellent if it was not for the fact that his right anterior thigh apparently had an allergic reaction to adhesive tape that he used. The wound itself that we have been monitoring actually appears to be healed. There is a lot of irritation at this point. 12/29/2019 upon  evaluation today patient appears to be doing well in regard to his lower extremities. His left medial thigh is open and somewhat draining today but this is the only region that is open the right has done much better with the treatment utilizing the steroid cream that I prescribed for him last week. Overall I am pleased in that regard. Fortunately there is no signs of active infection at this time. No fevers, chills, nausea, vomiting, or diarrhea. 01/05/2020 upon evaluation today patient appears to be doing more poorly in regard to his right lower extremity at this point upon evaluation today. Unfortunately he continues to have issues in this regard and I think the biggest issue is controlling his edema. This obviously is not very well controlled at this point is been recommended that he use the Lasix twice a day but he has not been able to do that. Unfortunately I think this is leading to an issue where honestly he is not really able to effectively control his edema and therefore the wounds really are not doing significantly better. I do not think that he is going to be able  to keep things under good control unless he is able to control his edema much better. I discussed this again in great detail with him today. 01/12/2020 good news is patient actually appears to be doing quite well today at this point. He does have an appointment with lymphedema clinic tomorrow. His legs appear healed and the toe on the left is almost completely healed. In general I am very pleased with how things stand at this point. 01/19/2020 upon evaluation today patient appears to actually be doing well in regard to his lower extremities there is nothing open at this point. Fortunately he has done extremely well more recently. Has been seeing lymphedema clinic as well. With that being said he has Velcro wraps for his lower legs as well as his upper legs. The only wound really is on his toe which is the right great toe and this is barely anything even there. With all that being said I think it is good to be appropriate today to go ahead and switch him over to the Velcro compression wraps. 01/26/2020 upon evaluation today patient appears to be doing worse with regard to his lower extremities after last week switch him to Velcro compression wraps. Unfortunately he lasted less than 24 hours he did not have the sock portion of his Velcro wrap on the left leg and subsequently developed a blister underneath the Velcro portion. Obviously this is not good and not what we were looking for at this point. He states the lymphedema clinic did tell him to wear the wrap for 23 hours and take him off for 1 I am okay with that plan but again right now we got a get things back under control again he may have some cellulitis noted as well. Objective Constitutional Well-nourished and well-hydrated in no acute distress. Vitals Time Taken: 10:20 AM, Height: 70 in, Weight: 380.2 lbs, BMI: 54.5, Temperature: 98 F, Pulse: 52 bpm, Respiratory Rate: 18 breaths/min, Blood Pressure: 128/56 mmHg. Respiratory normal  breathing without difficulty. Psychiatric this patient is able to make decisions and demonstrates good insight into disease process. Alert and Oriented x 3. pleasant and cooperative. General Notes: Patient's wound bed currently showed signs of blister tissue on the left lower extremity I did clean this away just with saline gauze. Post debridement the wound bed appears to be doing okay there is some erythema inferior  to the wound which I think is evidence of infection at this point unfortunately. Integumentary (Hair, Skin) Wound #161 status is Open. Original cause of wound was Gradually Appeared. The wound is located on the Right Toe Great. The wound measures 0.3cm length x 0.2cm width x 0.1cm depth; 0.047cm^2 area and 0.005cm^3 volume. There is no tunneling or undermining noted. There is a medium amount of serosanguineous drainage noted. The wound margin is distinct with the outline attached to the wound base. There is large (67-100%) red, pink granulation within the wound bed. There is no necrotic tissue within the wound bed. Wound #162 status is Open. Original cause of wound was Gradually Appeared. The wound is located on the Right Toe Second. The wound measures 0.6cm length x 0.3cm width x 0.1cm depth; 0.141cm^2 area and 0.014cm^3 volume. There is no tunneling or undermining noted. There is a medium amount of serosanguineous drainage noted. The wound margin is distinct with the outline attached to the wound base. There is large (67-100%) pink granulation within the wound bed. There is no necrotic tissue within the wound bed. Wound #163 status is Open. Original cause of wound was Blister. The wound is located on the Left,Medial Lower Leg. The wound measures 5cm length x 12.5cm width x 0.1cm depth; 49.087cm^2 area and 4.909cm^3 volume. There is no tunneling or undermining noted. There is a medium amount of serosanguineous drainage noted. The wound margin is distinct with the outline attached  to the wound base. There is large (67-100%) red, pink granulation within the wound bed. There is no necrotic tissue within the wound bed. 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 There was a Four Layer Compression Therapy Procedure by Yevonne Pax, RN. Post procedure Diagnosis Wound #163: Same as Pre-Procedure Plan Follow-up Appointments: Return Appointment in 1 week. Dressing Change Frequency: Wound #163 Left,Medial Lower Leg: Do not change entire dressing for one week. Skin Barriers/Peri-Wound Care: Wound #163 Left,Medial Lower Leg: Moisturizing lotion - to leg Wound Cleansing: Wound #161 Right Toe Great: May shower and wash wound with soap and water. Wound #162 Right Toe Second: May shower and wash wound with soap and water. Wound #163 Left,Medial Lower Leg: May shower with protection. Primary Wound Dressing: Calcium Alginate with Silver - to weeping area on left lower leg Wound #161 Right Toe Great: Calcium Alginate with Silver Wound #162 Right Toe Second: Calcium Alginate with Silver Secondary Dressing: Wound #161 Right Toe Great: Kerlix/Rolled Gauze Dry Gauze Wound #162 Right Toe Second: Kerlix/Rolled Gauze Dry Gauze Wound #163 Left,Medial Lower Leg: ABD pad - to left lower leg Zetuvit or Kerramax - to left lower leg Edema Control: 4 layer compression: Left lower extremity Unna Boot to Right Lower Extremity Avoid standing for long periods of  time Elevate legs to the level of the heart or above for 30 minutes daily and/or when sitting, a frequency of: - whenever sitting Exercise regularly Segmental Compressive Device. - lymphadema pumps 60 minutes 2 times per day Additional Orders / Instructions: Other: - take lasix as prescribed by cardiologist The following medication(s) was prescribed:  doxycycline hyclate oral 100 mg capsule 1 1 capsule oral taken 2 times per day for 14 days starting 01/26/2020 1. I would recommend currently that we reinitiate the compression wraps as before since the patient is done well with this we need to get things back under control. 2. I would recommend as well that we continue with the silver alginate dressing to any open wound locations. 3. I am also can recommend that he continue with the upper portion of his Velcro wraps as ordered by the lymphedema clinic to try to keep the edema in this region under control. 4. I did advise the patient that in the future he absolutely needs to have the sock portion of the wrap as well as the Velcro wrap on whenever he is wearing his juxta lites otherwise they do not function appropriately this is something that we have mentioned to him before. We will see patient back for reevaluation in 1 week here in the clinic. If anything worsens or changes patient will contact our office for additional recommendations. Electronic Signature(s) Signed: 01/26/2020 11:34:52 AM By: Lenda Kelp PA-C Previous Signature: 01/26/2020 11:31:46 AM Version By: Lenda Kelp PA-C Entered By: Lenda Kelp on 01/26/2020 11:34:51 -------------------------------------------------------------------------------- SuperBill Details Patient Name: Date of Service: [name], [name] 01/26/2020 Medical Record [id number] Patient Account Number: [id number] Date of Birth/Sex: Treating RN: 05-13-1951 (69 y.o. [name] Primary Care Provider: Nicoletta [name] Other  Clinician: Referring Provider: Treating Provider/Extender:[name], [name] in Treatment: 209 Diagnosis Coding ICD-10 Codes Code Description 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 skin L97.221 Non-pressure chronic ulcer of left calf limited to breakdown of skin 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 of bilateral lower extremity I89.0 Lymphedema, not elsewhere classified E11.622 Type 2 diabetes mellitus with other skin ulcer 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, unspecified L03.116 Cellulitis of left lower limb Facility Procedures CPT4 Code Description: 91478295 (Facility Use Only) [phone] - APPLY UNNA BOOT RT Modifier: Quantity: 1 CPT4 Code Description: 57846962 (Facility Use Only) 95284XL - APPLY MULTLAY COMPRS LWR LT LEG Modifier: 59 Quantity: 1 Physician Procedures CPT4: Code 2440102 99 Description: 214 - WC PHYS LEVEL 4 - EST PT ICD-10 Diagnosis Description L97.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 right calf limited to brea L97.221 Non-pressure chronic ulcer of left calf limited to break I87.333 Chronic venous hypertension (idiopathic)  with ulcer and lower extremity I89.0 Lymphedema, not elsewhere classified Modifier: kdown of skin down of skin inflammation of Quantity: 1 bilateral Electronic Signature(s) Signed: 01/26/2020 11:32:04 AM By: Lenda Kelp PA-C Entered By: Lenda Kelp on 01/26/2020 11:32:01

## 2020-01-26 NOTE — Progress Notes (Signed)
Kevin, [name] ([id number]) Visit Report for 01/26/2020 Arrival Information Details Patient Name: Date of Service: [name], [name] 01/26/2020 10:30 AM Medical Record [id number] Patient Account Number: [id number] Date of Birth/Sex: Treating RN: 06/07/1951 (69 y.o. [name] Primary Care Lyzbeth Genrich: [name] Other Clinician: Referring Kaena Santori: Treating Reilynn Lauro/Extender:[name], [name], [name] in Treatment: 209 Visit Information History Since Last Visit Walker Added or deleted any medications: No Patient Arrived: Any new allergies or adverse reactions: No Arrival Time: 10:20 Had a fall or experienced change in No Accompanied By: self activities of daily living that may affect Transfer Assistance: None risk of falls: Patient Identification Verified: Yes Signs or symptoms of abuse/neglect since last No Secondary Verification Process Completed: Yes visito Patient Requires Transmission-Based No Hospitalized since last visit: No Precautions: Implantable device outside of the clinic excluding No Patient Has Alerts: Yes cellular tissue based products placed in the center since last visit: Has Dressing in Place as Prescribed: Yes Has Compression in Place as Prescribed: Yes Pain Present Now: No Electronic Signature(s) Signed: 01/26/2020 5:22:14 PM By: Deon Pilling Entered By: Deon Pilling on 01/26/2020 10:31:01 -------------------------------------------------------------------------------- Compression Therapy Details Patient Name: Date of Service: [name], [name] 01/26/2020 10:30 AM Medical Record [id number] Patient Account Number: [id number] Date of Birth/Sex: Treating RN: 02-17-1951 (69 y.o. [name] Primary Care Danaye Sobh: [name] Other Clinician: Referring Etoy Mcdonnell: Treating Arnold Depinto/Extender:[name], [name], [name] in Treatment: 209 Compression Therapy Performed for Wound Wound #163 Left,Medial Lower  Leg Assessment: Performed By: Clinician [name], RN Compression Type: Four Layer Post Procedure Diagnosis Same as Pre-procedure Electronic Signature(s) Signed: 01/26/2020 5:36:50 PM By: Baruch Gouty RN, BSN Entered By: Baruch Gouty on 01/26/2020 11:13:43 -------------------------------------------------------------------------------- Encounter Discharge Information Details Patient Name: Date of Service: [name] 01/26/2020 10:30 AM Medical Record [id number] Patient Account Number: [id number] Date of Birth/Sex: Treating RN: 1950/11/10 (68 y.o. [name]) [name] Primary Care Yuridia Couts: [name] Other Clinician: Referring Timur Nibert: Treating [name]/Extender:[name], [name], [name] in Treatment: 209 Encounter Discharge Information Items Discharge Condition: Stable Ambulatory Status: Walker Discharge Destination: Home Transportation: Private Auto Accompanied By: self Schedule Follow-up Appointment: Yes Clinical Summary of Care: Patient Declined Electronic Signature(s) Signed: 01/26/2020 5:15:50 PM By: [name] RN Entered By: [name] on 01/26/2020 11:51:14 -------------------------------------------------------------------------------- Lower Extremity Assessment Details Patient Name: Date of Service: [name], SPECHT 01/26/2020 10:30 AM Medical Record [id number] Patient Account Number: [id number] Date of Birth/Sex: Treating RN: 20-Dec-1950 (69 y.o. [name] Primary Care Jaquin Coy: [name] Other Clinician: Referring [name]: Treating [name]/Extender:[name], [name], [name] in Treatment: 209 Edema Assessment Assessed: [Left: Yes] [Right: Yes] Edema: [Left: Yes] [Right: Yes] Calf Left: Right: Point of Measurement: 31 cm From Medial Instep 40 cm 43 cm Ankle Left: Right: Point of Measurement: 12 cm From Medial Instep 29 cm 31 cm Electronic Signature(s) Signed: 01/26/2020 5:22:14 PM By: Deon Pilling Entered By: Deon Pilling on 01/26/2020 10:31:48 -------------------------------------------------------------------------------- Multi-Disciplinary Care Plan Details Patient Name: Date of Service: [name] 01/26/2020 10:30 AM Medical Record [id number] Patient Account Number: [id number] Date of Birth/Sex: Treating RN: 03/10/1951 (69 y.o. [name] Primary Care Cassie Henkels: [name] Other Clinician: Referring Luvina Poirier: Treating Calaya Gildner/Extender:[name], [name], [name] in Treatment: 209 Active Inactive Venous Leg Ulcer Nursing Diagnoses: Actual venous Insuffiency (use after diagnosis is confirmed) Goals: Patient will maintain optimal edema control Date Initiated: 09/10/2016 Target Resolution Date: 02/23/2020 Goal Status: Active Verify adequate tissue perfusion prior to therapeutic compression application Date Initiated: 09/10/2016 Date  Inactivated: 11/28/2016 Goal Status: Met Interventions: Assess peripheral edema status every visit. Compression as ordered Provide education on venous insufficiency Notes: edema not contolled above wraps, pt not using lymoh pumps regularly Wound/Skin Impairment Nursing Diagnoses: Impaired tissue integrity Goals: Patient/caregiver will verbalize understanding of skin care regimen Date Initiated: 09/10/2016 Target Resolution Date: 02/23/2020 Goal Status: Active Interventions: Assess patient/caregiver ability to perform ulcer/skin care regimen upon admission and as needed Assess ulceration(s) every visit Provide education on ulcer and skin care Notes: Electronic Signature(s) Signed: 01/26/2020 5:36:50 PM By: Baruch Gouty RN, BSN Entered By: Baruch Gouty on 01/26/2020 11:12:37 -------------------------------------------------------------------------------- Pain Assessment Details Patient Name: Date of Service: [name] 01/26/2020 10:30 AM Medical Record [id number] Patient Account  Number: [id number] Date of Birth/Sex: Treating RN: [birth year] (69 y.o. [name] Primary Care Neymar Dowe: [name] Other Clinician: Referring Izumi Mixon: Treating Julitza Rickles/Extender:[name], [name], [name] in Treatment: 209 Active Problems Location of Pain Severity and Description of Pain Patient Has Paino No Site Locations Rate the pain. Current Pain Level: 0 Pain Management and Medication Current Pain Management: Medication: No Cold Application: No Rest: No Massage: No Activity: No T.E.N.S.: No Heat Application: No Leg drop or elevation: No Is the Current Pain Management Adequate: Adequate How does your wound impact your activities of daily livingo Sleep: No Bathing: No Appetite: No Relationship With Others: No Bladder Continence: No Emotions: No Bowel Continence: No Work: No Toileting: No Drive: No Dressing: No Hobbies: No Electronic Signature(s) Signed: 01/26/2020 5:22:14 PM By: Deon Pilling Entered By: Deon Pilling on 01/26/2020 10:31:29 -------------------------------------------------------------------------------- Patient/Caregiver Education Details Patient Name: Date of Service: [name] 3/24/2021andnbsp10:30 AM Medical Record [id number] Patient Account Number: [id number] Date of Birth/Gender: Treating RN: 01-May-1951 (69 y.o. [name] Primary Care Physician: [name] Other Clinician: Referring Physician: Treating Physician/Extender:[name], [name], [name] in Treatment: 209 Education Assessment Education Provided To: Patient Education Topics Provided Venous: Methods: Explain/Verbal Responses: Reinforcements needed, State content correctly Wound/Skin Impairment: Methods: Explain/Verbal Responses: Reinforcements needed, State content correctly Electronic Signature(s) Signed: 01/26/2020 5:36:50 PM By: Baruch Gouty RN, BSN Entered By: Baruch Gouty on 01/26/2020  11:13:04 -------------------------------------------------------------------------------- Wound Assessment Details Patient Name: Date of Service: [name] 01/26/2020 10:30 AM Medical Record [id number] Patient Account Number: [id number] Date of Birth/Sex: Treating RN: 1951/09/02 (69 y.o. [name] Primary Care Raseel Jans: [name] Other Clinician: Referring Lorie Melichar: Treating Payton Prinsen/Extender:[name], [name], [name] in Treatment: 209 Wound Status Wound Number: [id number] Primary Lymphedema Etiology: Wound Location: Right Toe Great Wound Open Wounding Event: Gradually Appeared Status: Date Acquired: 01/19/2020 Comorbid Chronic sinus problems/congestion, Weeks Of Treatment: 1 History: Arrhythmia, Hypertension, Peripheral Arterial Clustered Wound: No Disease, Type II Diabetes, History of Burn, Gout, Confinement Anxiety Wound Measurements Length: (cm) 0.3 % Reduct Width: (cm) 0.2 % Reduct Depth: (cm) 0.1 Epitheli Area: (cm) 0.047 Tunneli Volume: (cm) 0.005 Undermi Wound Description Full Thickness Without Exposed Support Foul Od Classification: Structures Slough/ Wound Distinct, outline attached Margin: Exudate Medium Amount: Exudate Serosanguineous Type: Exudate red, brown Color: Wound Bed Granulation Amount: Large (67-100%) Granulation Quality: Red, Pink Fascia E Necrotic Amount: None Present (0%) Fat Laye Tendon E Muscle E Joint Ex Bone Exp or After Cleansing: No Fibrino No Exposed Structure xposed: No r (Subcutaneous Tissue) Exposed: No xposed: No xposed: No posed: No osed: No ion in Area: 92% ion in Volume: 91.5% alization: Large (67-100%) ng: No ning: No Treatment Notes Wound #161 (Right Toe Great) 1. Cleanse With Wound Cleanser Soap and water 3. Primary Dressing  Applied Calcium Alginate Ag 4. Secondary Dressing Dry Gauze Roll Gauze 5. Secured With Recruitment consultant) Signed: 01/26/2020 5:22:14  PM By: Deon Pilling Entered By: Deon Pilling on 01/26/2020 10:28:30 -------------------------------------------------------------------------------- Wound Assessment Details Patient Name: Date of Service: [name], [name] 01/26/2020 10:30 AM Medical Record [id number] Patient Account Number: [id number] Date of Birth/Sex: Treating RN: 04-02-1951 (69 y.o. [name] Primary Care Kanaya Gunnarson: [name] Other Clinician: Referring Reshonda Koerber: Treating Fenris Cauble/Extender:[name], [name], [name] in Treatment: 209 Wound Status Wound Number: [id number] Primary Lymphedema Etiology: Wound Location: Right Toe Second Wound Open Wounding Event: Gradually Appeared Status: Date Acquired: 01/25/2020 Comorbid Chronic sinus problems/congestion, Weeks Of Treatment: 0 History: Arrhythmia, Hypertension, Peripheral Arterial Clustered Wound: No Disease, Type II Diabetes, History of Burn, Gout, Confinement Anxiety Wound Measurements Length: (cm) 0.6 Width: (cm) 0.3 Depth: (cm) 0.1 Area: (cm) 0.141 Volume: (cm) 0.014 Wound Description Classification: Partial Thickness Wound Margin: Distinct, outline attached Exudate Amount: Medium Exudate Type: Serosanguineous Exudate Color: red, brown Wound Bed Granulation Amount: Large (67-100%) Granulation Quality: Pink Necrotic Amount: None Present (0%) fter Cleansing: No ino No Exposed Structure ed: No ubcutaneous Tissue) Exposed: No ed: No ed: No d: No : No % Reduction in Area: % Reduction in Volume: Epithelialization: None Tunneling: No Undermining: No Foul Odor A Slough/Fibr Fascia Expos Fat Layer (S Tendon Expos Muscle Expos Joint Expose Bone Exposed Treatment Notes Wound #162 (Right Toe Second) 1. Cleanse With Wound Cleanser Soap and water 3. Primary Dressing Applied Calcium Alginate Ag 4. Secondary Dressing Dry Gauze Roll Gauze 5. Secured With Recruitment consultant) Signed: 01/26/2020 5:22:14 PM  By: Deon Pilling Entered By: Deon Pilling on 01/26/2020 10:27:54 -------------------------------------------------------------------------------- Wound Assessment Details Patient Name: Date of Service: [name], [name] 01/26/2020 10:30 AM Medical Record [id number] Patient Account Number: [id number] Date of Birth/Sex: Treating RN: 11/18/1950 (69 y.o. [name] Primary Care Deniz Eskridge: [name] Other Clinician: Referring Daniya Aramburo: Treating Ahan Eisenberger/Extender:[name], [name], [name] in Treatment: 209 Wound Status Wound Number: [id number] Primary Lymphedema Etiology: Wound Location: Left Lower Leg - Medial Wound Open Wounding Event: Blister Status: Date Acquired: 01/20/2020 Comorbid Chronic sinus problems/congestion, Weeks Of Treatment: 0 History: Arrhythmia, Hypertension, Peripheral Arterial Clustered Wound: No Disease, Type II Diabetes, History of Burn, Gout, Confinement Anxiety Wound Measurements Length: (cm) 5 % Reduc Width: (cm) 12.5 % Reduc Depth: (cm) 0.1 Epithel Area: (cm) 49.087 Tunnel Volume: (cm) 4.909 Underm Wound Description Full Thickness Without Exposed Support Classification: Structures Wound Distinct, outline attached Margin: Exudate Medium Amount: Exudate Serosanguineous Type: Exudate red, brown Color: Wound Bed Granulation Amount: Large (67-100%) Granulation Quality: Red, Pink Necrotic Amount: None Present (0%) Foul Odor After Cleansing: No Slough/Fibrino No Exposed Structure Fascia Exposed: No Fat Layer (Subcutaneous Tissue) Exposed: No Tendon Exposed: No Muscle Exposed: No Joint Exposed: No Bone Exposed: No tion in Area: tion in Volume: ialization: None ing: No ining: No Treatment Notes Wound #163 (Left, Medial Lower Leg) 1. Cleanse With Wound Cleanser Soap and water 3. Primary Dressing Applied Calcium Alginate Ag 4. Secondary Dressing ABD Pad Dry Gauze 6. Support Layer Applied 4 layer compression  wrap Notes unna top for Acupuncturist) Signed: 01/26/2020 5:22:14 PM By: Deon Pilling Entered By: Deon Pilling on 01/26/2020 10:30:32 -------------------------------------------------------------------------------- Vitals Details Patient Name: Date of Service: [name] 01/26/2020 10:30 AM Medical Record [id number] Patient Account Number: [id number] Date of Birth/Sex: Treating RN: June 22, 1951 (69 y.o. [name] Primary Care Justyce Baby: [name] Other Clinician: Referring Jahlon Baines: Treating Tylan Briguglio/Extender:[name], [name], [name]  Weeks in Treatment: 209 Vital Signs Time Taken: 10:20 Temperature (F): 98 Height (in): 70 Pulse (bpm): 52 Weight (lbs): 380.2 Respiratory Rate (breaths/min): 18 Body Mass Index (BMI): 54.5 Blood Pressure (mmHg): 128/56 Reference Range: 80 - 120 mg / dl Electronic Signature(s) Signed: 01/26/2020 5:22:14 PM By: Deon Pilling Entered By: Deon Pilling on 01/26/2020 10:31:18

## 2020-01-27 DIAGNOSIS — R531 Weakness: Secondary | ICD-10-CM | POA: Diagnosis not present

## 2020-01-27 DIAGNOSIS — R29898 Other symptoms and signs involving the musculoskeletal system: Secondary | ICD-10-CM | POA: Diagnosis not present

## 2020-01-27 DIAGNOSIS — I890 Lymphedema, not elsewhere classified: Secondary | ICD-10-CM | POA: Diagnosis not present

## 2020-01-31 ENCOUNTER — Telehealth: Payer: Self-pay

## 2020-01-31 ENCOUNTER — Encounter: Payer: Self-pay | Admitting: Family Medicine

## 2020-01-31 NOTE — Telephone Encounter (Signed)
Rx faxed along with other info needed. Patient made aware of this.

## 2020-01-31 NOTE — Telephone Encounter (Signed)
I'll give rx. Pls send them all the other info listed. If he needs a face-to-face visit his insurer with surely let us know.

## 2020-01-31 NOTE — Telephone Encounter (Signed)
Patient called to say he needed a new rollator, his current one broke. He was told by [employer] that to obtain a new one, we would need to send over his demographic info with insurance, current o/v notes with height and weight and a new Rx specifying that he needs a bariatric rolator.  Please advise, thanks.

## 2020-02-02 ENCOUNTER — Encounter (HOSPITAL_BASED_OUTPATIENT_CLINIC_OR_DEPARTMENT_OTHER): Payer: Medicare Other | Admitting: Physician Assistant

## 2020-02-02 ENCOUNTER — Other Ambulatory Visit: Payer: Self-pay

## 2020-02-02 DIAGNOSIS — E11621 Type 2 diabetes mellitus with foot ulcer: Secondary | ICD-10-CM | POA: Diagnosis not present

## 2020-02-02 DIAGNOSIS — M109 Gout, unspecified: Secondary | ICD-10-CM | POA: Diagnosis not present

## 2020-02-02 DIAGNOSIS — L97811 Non-pressure chronic ulcer of other part of right lower leg limited to breakdown of skin: Secondary | ICD-10-CM | POA: Diagnosis not present

## 2020-02-02 DIAGNOSIS — I10 Essential (primary) hypertension: Secondary | ICD-10-CM | POA: Diagnosis not present

## 2020-02-02 DIAGNOSIS — E1140 Type 2 diabetes mellitus with diabetic neuropathy, unspecified: Secondary | ICD-10-CM | POA: Diagnosis not present

## 2020-02-02 DIAGNOSIS — L03116 Cellulitis of left lower limb: Secondary | ICD-10-CM | POA: Diagnosis not present

## 2020-02-02 DIAGNOSIS — I87333 Chronic venous hypertension (idiopathic) with ulcer and inflammation of bilateral lower extremity: Secondary | ICD-10-CM | POA: Diagnosis not present

## 2020-02-02 DIAGNOSIS — I890 Lymphedema, not elsewhere classified: Secondary | ICD-10-CM | POA: Diagnosis not present

## 2020-02-02 DIAGNOSIS — L97221 Non-pressure chronic ulcer of left calf limited to breakdown of skin: Secondary | ICD-10-CM | POA: Diagnosis not present

## 2020-02-02 DIAGNOSIS — E11622 Type 2 diabetes mellitus with other skin ulcer: Secondary | ICD-10-CM | POA: Diagnosis not present

## 2020-02-02 DIAGNOSIS — L97821 Non-pressure chronic ulcer of other part of left lower leg limited to breakdown of skin: Secondary | ICD-10-CM | POA: Diagnosis not present

## 2020-02-02 DIAGNOSIS — L97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 right calf limited to breakdown of skin: Secondary | ICD-10-CM | POA: Diagnosis not present

## 2020-02-02 NOTE — Progress Notes (Signed)
MARLO, GOODRICH ([id number]) Visit Report for 02/02/2020 Arrival Information Details Patient Name: Date of Service: [name], [name] 02/02/2020 10:15 AM Medical Record [id number] Patient Account Number: [id number] Date of Birth/Sex: Treating RN: 1950-11-05 (69 y.o. [name] Primary Care Imani Sherrin: [name] Other Clinician: Referring Amrom Ore: Treating Keegan Bensch/Extender:[name], Dema Severin, [name] in Treatment: 210 Visit Information History Since Last Visit Walker Added or deleted any medications: No Patient Arrived: Any new allergies or adverse reactions: No Arrival Time: 10:34 Had a fall or experienced change in No Accompanied By: self activities of daily living that may affect Transfer Assistance: None risk of falls: Patient Identification Verified: Yes Signs or symptoms of abuse/neglect since last No Secondary Verification Process Completed: Yes visito Patient Requires Transmission-Based No Hospitalized since last visit: No Precautions: Implantable device outside of the clinic excluding No Patient Has Alerts: Yes cellular tissue based products placed in the center since last visit: Has Dressing in Place as Prescribed: Yes Has Compression in Place as Prescribed: Yes Pain Present Now: No Electronic Signature(s) Signed: 02/02/2020 5:08:19 PM By: Deon Pilling Entered By: Deon Pilling on 02/02/2020 10:34:50 -------------------------------------------------------------------------------- Compression Therapy Details Patient Name: Date of Service: [name], [name] 02/02/2020 10:15 AM Medical Record [id number] Patient Account Number: [id number] Date of Birth/Sex: Treating RN: 08-03-1951 (69 y.o. [name] Primary Care Falynn Ailey: [name] Other Clinician: Referring Ladislav Caselli: Treating Jamaya Sleeth/Extender:[name], Dema Severin, [name] in Treatment: 210 Compression Therapy Performed for Wound Wound #164 Right,Medial Lower  Leg Assessment: Performed By: Jake Church, RN Compression Type: [name] Post Procedure Diagnosis Same as Pre-procedure Electronic Signature(s) Signed: 02/02/2020 6:50:57 PM By: Baruch Gouty RN, BSN Entered By: Baruch Gouty on 02/02/2020 11:25:37 -------------------------------------------------------------------------------- Compression Therapy Details Patient Name: Date of Service: [name], [name] 02/02/2020 10:15 AM Medical Record [id number] Patient Account Number: [id number] Date of Birth/Sex: Treating RN: May 22, 1951 (69 y.o. [name] Primary Care Jamian Andujo: [name] Other Clinician: Referring Leven Hoel: Treating Semaja Lymon/Extender:[name], Dema Severin, [name] in Treatment: 210 Compression Therapy Performed for Wound Wound #163 Left,Medial Lower Leg Assessment: Performed By: Clinician [name], RN Compression Type: Four Layer Post Procedure Diagnosis Same as Pre-procedure Electronic Signature(s) Signed: 02/02/2020 6:50:57 PM By: Baruch Gouty RN, BSN Entered By: Baruch Gouty on 02/02/2020 11:26:07 -------------------------------------------------------------------------------- Encounter Discharge Information Details Patient Name: Date of Service: [name], [name] 02/02/2020 10:15 AM Medical Record [id number] Patient Account Number: [id number] Date of Birth/Sex: Treating RN: 1951/09/26 (69 y.o. [name] Primary Care Kennett Symes: [name] Other Clinician: Referring Marycarmen Hagey: Treating Tremell Reimers/Extender:[name], Dema Severin, [name] in Treatment: 210 Encounter Discharge Information Items Discharge Condition: Stable Ambulatory Status: Walker Discharge Destination: Home Transportation: Private Auto Accompanied By: self Schedule Follow-up Appointment: Yes Clinical Summary of Care: Electronic Signature(s) Signed: 02/02/2020 5:08:19 PM By: Deon Pilling Entered By: Deon Pilling on 02/02/2020  12:05:00 -------------------------------------------------------------------------------- Lower Extremity Assessment Details Patient Name: Date of Service: [name], [name] 02/02/2020 10:15 AM Medical Record [id number] Patient Account Number: [id number] Date of Birth/Sex: Treating RN: 23-Mar-1951 (69 y.o. [name] Primary Care Sherrilynn Gudgel: [name] Other Clinician: Referring Shelvia Fojtik: Treating Vianca Bracher/Extender:[name], Dema Severin, [name] in Treatment: 210 Edema Assessment Assessed: [Left: No] [Right: No] Edema: [Left: Yes] [Right: Yes] Calf Left: Right: Point of Measurement: 31 cm From Medial Instep 40 cm 42 cm Ankle Left: Right: Point of Measurement: 12 cm From Medial Instep 26 cm 30 cm Electronic Signature(s) Signed: 02/02/2020 5:08:19 PM By: Deon Pilling Entered By: Deon Pilling on 02/02/2020 11:06:26 -------------------------------------------------------------------------------- [place]  Details Patient Name: Date of Service: [name], [name] 02/02/2020 10:15 AM Medical Record [id number] Patient Account Number: [id number] Date of Birth/Sex: Treating RN: April 14, 1951 (69 y.o. [name] Primary Care Shanvi Moyd: [name] Other Clinician: Referring Olvin Rohr: Treating Joselle Deeds/Extender:[name], Dema Severin, [name] in Treatment: 210 Active Inactive Venous Leg Ulcer Nursing Diagnoses: Actual venous Insuffiency (use after diagnosis is confirmed) Goals: Patient will maintain optimal edema control Date Initiated: 09/10/2016 Target Resolution Date: 02/23/2020 Goal Status: Active Verify adequate tissue perfusion prior to therapeutic compression application Date Initiated: 09/10/2016 Date Inactivated: 11/28/2016 Goal Status: Met Interventions: Assess peripheral edema status every visit. Compression as ordered Provide education on venous insufficiency Notes: edema not contolled above wraps, pt not using  lymoh pumps regularly Wound/Skin Impairment Nursing Diagnoses: Impaired tissue integrity Goals: Patient/caregiver will verbalize understanding of skin care regimen Date Initiated: 09/10/2016 Target Resolution Date: 02/23/2020 Goal Status: Active Interventions: Assess patient/caregiver ability to perform ulcer/skin care regimen upon admission and as needed Assess ulceration(s) every visit Provide education on ulcer and skin care Notes: Electronic Signature(s) Signed: 02/02/2020 6:50:57 PM By: Baruch Gouty RN, BSN Entered By: Baruch Gouty on 02/02/2020 11:24:06 -------------------------------------------------------------------------------- Pain Assessment Details Patient Name: Date of Service: [name], [name] 02/02/2020 10:15 AM Medical Record [id number] Patient Account Number: [id number] Date of Birth/Sex: Treating RN: Feb 15, 1951 (69 y.o. [name] Primary Care Daleisa Halperin: [name] Other Clinician: Referring Janelle Spellman: Treating Maymunah Stegemann/Extender:[name], Dema Severin, [name] in Treatment: 210 Active Problems Location of Pain Severity and Description of Pain Patient Has Paino No Site Locations Rate the pain. Current Pain Level: 0 Pain Management and Medication Current Pain Management: Medication: No Cold Application: No Rest: No Massage: No Activity: No T.E.N.S.: No Heat Application: No Leg drop or elevation: No Is the Current Pain Management Adequate: Adequate How does your wound impact your activities of daily livingo Sleep: No Bathing: No Appetite: No Relationship With Others: No Bladder Continence: No Emotions: No Bowel Continence: No Work: No Toileting: No Drive: No Dressing: No Hobbies: No Electronic Signature(s) Signed: 02/02/2020 5:08:19 PM By: Deon Pilling Entered By: Deon Pilling on 02/02/2020 10:42:47 -------------------------------------------------------------------------------- Patient/Caregiver Education  Details Patient Name: Date of Service: [name] 3/31/2021andnbsp10:15 AM Medical Record [id number] Patient Account Number: [id number] Date of Birth/Gender: Treating RN: 07-26-1951 (70 y.o. [name] Primary Care Physician: [name] Other Clinician: Referring Physician: Treating Physician/Extender:[name], Dema Severin, [name] in Treatment: 210 Education Assessment Education Provided To: Patient Education Topics Provided Venous: Methods: Explain/Verbal Responses: Reinforcements needed, State content correctly Wound/Skin Impairment: Methods: Explain/Verbal Responses: Reinforcements needed, State content correctly Electronic Signature(s) Signed: 02/02/2020 6:50:57 PM By: Baruch Gouty RN, BSN Entered By: Baruch Gouty on 02/02/2020 11:24:36 -------------------------------------------------------------------------------- Wound Assessment Details Patient Name: Date of Service: [name], [name] 02/02/2020 10:15 AM Medical Record [id number] Patient Account Number: [id number] Date of Birth/Sex: Treating RN: 1951/07/20 (69 y.o. [name] Primary Care Zyler Hyson: [name] Other Clinician: Referring Anjani Feuerborn: Treating Gus Littler/Extender:[name], Dema Severin, [name] in Treatment: 210 Wound Status Wound Number: [id number] Primary Lymphedema Etiology: Wound Location: Right Toe Great Wound Open Wounding Event: Gradually Appeared Status: Date Acquired: 01/19/2020 Comorbid Chronic sinus problems/congestion, Weeks Of Treatment: 2 History: Arrhythmia, Hypertension, Peripheral Arterial Clustered Wound: No Disease, Type II Diabetes, History of Burn, Gout, Confinement Anxiety Wound Measurements Length: (cm) 0.1 % Reduc Width: (cm) 0.1 % Reduc Depth: (cm) 0.1 Epithel Area: (cm) 0.008 Tunnel Volume: (cm) 0.001 Underm Wound Description Full Thickness Without Exposed Support Foul O Classification: Structures Slough Wound  Distinct, outline  attached Margin: Exudate Medium Amount: Exudate Serosanguineous Type: Exudate red, brown Color: Wound Bed Granulation Amount: Large (67-100%) Granulation Quality: Red, Pink Fascia Necrotic Amount: None Present (0%) Fat Layer ( Tendon Expo Muscle Expo Joint Expos Bone Expose dor After Cleansing: No /Fibrino No Exposed Structure Exposed: No Subcutaneous Tissue) Exposed: No sed: No sed: No ed: No d: No tion in Area: 98.6% tion in Volume: 98.3% ialization: Large (67-100%) ing: No ining: No Treatment Notes Wound #161 (Right Toe Great) 1. Cleanse With Wound Cleanser Soap and water 3. Primary Dressing Applied Calcium Alginate Ag 4. Secondary Dressing Dry Gauze Roll Gauze 5. Secured With [facility]) Signed: 02/02/2020 5:08:19 PM By: Deon Pilling Entered By: Deon Pilling on 02/02/2020 11:07:33 -------------------------------------------------------------------------------- Wound Assessment Details Patient Name: Date of Service: [name], [name] 02/02/2020 10:15 AM Medical Record [id number] Patient Account Number: [id number] Date of Birth/Sex: Treating RN: [birth year] (69 y.o. [name] Primary Care Marleny Faller: [name] Other Clinician: Referring Kyley Laurel: Treating Danahi Reddish/Extender:[name], Dema Severin, [name] in Treatment: 210 Wound Status Wound Number: [id number] Primary Lymphedema Etiology: Wound Location: Right Toe Second Wound Open Wounding Event: Gradually Appeared Status: Date Acquired: 01/25/2020 Comorbid Chronic sinus problems/congestion, Weeks Of Treatment: 1 History: Arrhythmia, Hypertension, Peripheral Arterial Clustered Wound: No Disease, Type II Diabetes, History of Burn, Gout, Confinement Anxiety Wound Measurements Length: (cm) 0.9 Width: (cm) 1 Depth: (cm) 0.1 Area: (cm) 0.707 Volume: (cm) 0.071 Wound Description Classification: Partial Thickness Wound Margin:  Distinct, outline attached Exudate Amount: Medium Exudate Type: Serosanguineous Exudate Color: red, brown Wound Bed Granulation Amount: Large (67-100%) Granulation Quality: Pink Necrotic Amount: None Present (0%) fter Cleansing: No ino No Exposed Structure sed: No Subcutaneous Tissue) Exposed: No sed: No sed: No ed: No d: No % Reduction in Area: -401.4% % Reduction in Volume: -407.1% Epithelialization: None Tunneling: No Undermining: No Foul Odor A Slough/Fibr Fascia Expo Fat Layer ( Tendon Expo Muscle Expo Joint Expos Bone Expose Treatment Notes Wound #162 (Right Toe Second) 1. Cleanse With Wound Cleanser Soap and water 3. Primary Dressing Applied Calcium Alginate Ag 4. Secondary Dressing Dry Gauze Roll Gauze 5. Secured With [facility]) Signed: 02/02/2020 5:08:19 PM By: Deon Pilling Entered By: Deon Pilling on 02/02/2020 11:08:33 -------------------------------------------------------------------------------- Wound Assessment Details Patient Name: Date of Service: [name], [name] 02/02/2020 10:15 AM Medical Record [id number] Patient Account Number: [id number] Date of Birth/Sex: Treating RN: 08/01/1951 (69 y.o. [name] Primary Care Addiel Mccardle: [name] Other Clinician: Referring Tristen Pennino: Treating Demetri Goshert/Extender:[name], Dema Severin, [name] in Treatment: 210 Wound Status Wound Number: [id number] Primary Lymphedema Etiology: Wound Location: Left Lower Leg - Medial Wound Open Wounding Event: Blister Status: Date Acquired: 01/20/2020 Comorbid Chronic sinus problems/congestion, Weeks Of Treatment: 1 History: Arrhythmia, Hypertension, Peripheral Arterial Clustered Wound: No Disease, Type II Diabetes, History of Burn, Gout, Confinement Anxiety Wound Measurements Length: (cm) 0 % Reduction Width: (cm) 0 % Reduction Depth: (cm) 0 Epithel Area: (cm) 0 Tunnel Volume: (cm) 0 Underm Wound  Description Classification: Full Thickness Without Exposed Support Structures Wound Distinct, outline attached Margin: Exudate None Present Amount: Wound Bed Granulation Amount: None Present (0%) Necrotic Amount: None Present (0%) Foul Odor After Cleansing: No Slough/Fibrino No Exposed Structure Fascia Exposed: No Fat Layer (Subcutaneous Tissue) Exposed: No Tendon Exposed: No Muscle Exposed: No Joint Exposed: No Bone Exposed: No in Area: 100% in Volume: 100% ialization: Large (67-100%) ing: No ining: No Treatment Notes Wound #163 (Left, Medial Lower Leg) 1. Cleanse With Wound Cleanser Soap and water 2.  Periwound Care Moisturizing lotion 3. Primary Dressing Applied Calcium Alginate Ag 4. Secondary Dressing ABD Pad Dry Gauze 6. Support Layer Applied 4 layer compression wrap Notes netting. Electronic Signature(s) Signed: 02/02/2020 5:08:19 PM By: Deon Pilling Entered By: Deon Pilling on 02/02/2020 11:10:09 -------------------------------------------------------------------------------- Wound Assessment Details Patient Name: Date of Service: [name], [name] 02/02/2020 10:15 AM Medical Record [id number] Patient Account Number: [id number] Date of Birth/Sex: Treating RN: [birth year] (69 y.o. [name] Primary Care Sesilia Poucher: [name] Other Clinician: Referring Kassidi Elza: Treating Amina Menchaca/Extender:[name], Dema Severin, [name] in Treatment: 210 Wound Status Wound Number: [id number] Primary Lymphedema Etiology: Wound Location: Right Lower Leg - Medial Wound Open Wounding Event: Blister Status: Date Acquired: 02/02/2020 Comorbid Chronic sinus problems/congestion, Weeks Of Treatment: 0 History: Arrhythmia, Hypertension, Peripheral Arterial Clustered Wound: No Disease, Type II Diabetes, History of Burn, Gout, Confinement Anxiety Wound Measurements Length: (cm) 3 % Reduct Width: (cm) 6.5 % Reduct Depth: (cm) 0.1 Epitheli Area: (cm)  15.315 Tunneli Volume: (cm) 1.532 Undermi Wound Description Classification: Full Thickness Without Exposed Support Foul Odo Structures Slough/F Exudate Medium Amount: Exudate Serosanguineous Type: Exudate red, brown Color: Wound Bed Granulation Amount: Medium (34-66%) Granulation Quality: Red Fascia E Necrotic Amount: Medium (34-66%) Fat Laye Necrotic Quality: Adherent Slough Tendon E Muscle E Joint Ex Bone Exp r After Cleansing: No ibrino Yes Exposed Structure xposed: No r (Subcutaneous Tissue) Exposed: Yes xposed: No xposed: No posed: No osed: No ion in Area: 0% ion in Volume: 0% alization: None ng: No ning: No Treatment Notes Wound #164 (Right, Medial Lower Leg) 1. Cleanse With Wound Cleanser Soap and water 2. Periwound Care Moisturizing lotion 3. Primary Dressing Applied Calcium Alginate Ag 4. Secondary Dressing ABD Pad 6. Support Layer [employer] Notes netting. Electronic Signature(s) Signed: 02/02/2020 5:08:19 PM By: Deon Pilling Entered By: Deon Pilling on 02/02/2020 11:12:51 -------------------------------------------------------------------------------- Wound Assessment Details Patient Name: Date of Service: CONSTANTINOS, KREMPASKY 02/02/2020 10:15 AM Medical Record [id number] Patient Account Number: [id number] Date of Birth/Sex: Treating RN: 1950-11-29 (69 y.o. [name] Primary Care Bani Gianfrancesco: [name] Other Clinician: Referring [place]: Treating Amiee Wiley/Extender:[name], Dema Severin, [name] in Treatment: 210 Wound Status Wound Number: [id number] Primary Lymphedema Etiology: Wound Location: Left Toe Great Wound Open Wounding Event: Gradually Appeared Status: Date Acquired: 02/02/2020 Comorbid Chronic sinus problems/congestion, Weeks Of Treatment: 0 History: Arrhythmia, Hypertension, Peripheral Arterial Clustered Wound: No Disease, Type II Diabetes, History of Burn, Gout, Confinement Anxiety Wound  Measurements Length: (cm) 0.8 % Reduc Width: (cm) 0.7 % Reduc Depth: (cm) 0.1 Epithel Area: (cm) 0.44 Tunnel Volume: (cm) 0.044 Underm Wound Description Full Thickness Without Exposed Support Classification: Structures Exudate Medium Amount: Exudate Serosanguineous Type: Exudate red, brown Color: Wound Bed Granulation Amount: Medium (34-66%) Granulation Quality: Pink Necrotic Amount: Medium (34-66%) Necrotic Quality: Adherent Slough Foul Odor After Cleansing: No Slough/Fibrino Yes Exposed Structure Fascia Exposed: No Fat Layer (Subcutaneous Tissue) Exposed: Yes Tendon Exposed: No Muscle Exposed: No Joint Exposed: No Bone Exposed: No tion in Area: 0% tion in Volume: 0% ialization: None ing: No ining: No Treatment Notes Wound #165 (Left Toe Great) 1. Cleanse With Wound Cleanser Soap and water 3. Primary Dressing Applied Calcium Alginate Ag 4. Secondary Dressing Dry Gauze Roll Gauze 5. Secured With [facility]) Signed: 02/02/2020 5:08:19 PM By: Deon Pilling Entered By: Deon Pilling on 02/02/2020 11:13:53 -------------------------------------------------------------------------------- Wound Assessment Details Patient Name: Date of Service: JAMELLE, GOLDSTON 02/02/2020 10:15 AM Medical Record [id number] Patient Account Number: [id number] Date of Birth/Sex: Treating RN: August 22, 1951 (  69 y.o. [name] Primary Care Tysheka Fanguy: [name] Other Clinician: Referring Mackay Hanauer: Treating Tupac Jeffus/Extender:[name], Dema Severin, [name] in Treatment: 210 Wound Status Wound Number: [id number] Primary Lymphedema Etiology: Wound Location: Left Foot - Dorsal Wound Open Wounding Event: Gradually Appeared Status: Date Acquired: 02/02/2020 Comorbid Chronic sinus problems/congestion, Weeks Of Treatment: 0 History: Arrhythmia, Hypertension, Peripheral Arterial Clustered Wound: No Disease, Type II Diabetes, History of  Burn, Gout, Confinement Anxiety Wound Measurements Length: (cm) 1 % Reduc Width: (cm) 1 % Reduc Depth: (cm) 0.1 Epithel Area: (cm) 0.785 Tunnel Volume: (cm) 0.079 Underm Wound Description Classification: Full Thickness Without Exposed Support Structures Exudate Medium Amount: Exudate Serosanguineous Type: Exudate red, brown Color: Wound Bed Granulation Amount: Medium (34-66%) Granulation Quality: Pink Necrotic Amount: Medium (34-66%) Necrotic Quality: Adherent Slough Treatment Notes Wound #166 (Left, Dorsal Foot) 1. Cleanse With Wound Cleanser Soap and water 2. Periwound Care Moisturizing lotion 3. Primary Dressing Applied Calcium Alginate Ag 4. Secondary Dressing ABD Pad Dry Gauze 6. Support Layer Applied 4 layer compression wrap Foul Odor After Cleansing: No Slough/Fibrino Yes Exposed Structure Fascia Exposed: No Fat Layer (Subcutaneous Tissue) Exposed: Yes Tendon Exposed: No Muscle Exposed: No Joint Exposed: No Bone Exposed: No tion in Area: 0% tion in Volume: 0% ialization: None ing: No ining: No Notes netting. Electronic Signature(s) Signed: 02/02/2020 5:08:19 PM By: Deon Pilling Entered By: Deon Pilling on 02/02/2020 11:14:38 -------------------------------------------------------------------------------- Vitals Details Patient Name: Date of Service: PJ, ZEHNER 02/02/2020 10:15 AM Medical Record [id number] Patient Account Number: [id number] Date of Birth/Sex: Treating RN: [birth year] (69 y.o. [name] Primary Care Ethel Meisenheimer: [name] Other Clinician: Referring Delphina Schum: Treating Eutimio Gharibian/Extender:[name], Dema Severin, [name] in Treatment: 210 Vital Signs Time Taken: 10:39 Temperature (F): 97.7 Height (in): 70 Pulse (bpm): 69 Weight (lbs): 380.2 Respiratory Rate (breaths/min): 18 Body Mass Index (BMI): 54.5 Blood Pressure (mmHg): 155/64 Reference Range: 80 - 120 mg / dl Electronic  Signature(s) Signed: 02/02/2020 5:08:19 PM By: Deon Pilling Entered By: Deon Pilling on 02/02/2020 10:42:40

## 2020-02-02 NOTE — Progress Notes (Addendum)
[name], [name] ([id number]) Visit Report for 02/02/2020 Chief Complaint Document Details Patient Name: Date of Service: [name], [name] 02/02/2020 10:15 AM Medical Record [id number] Patient Account Number: [id number] Date of Birth/Sex: Treating RN: November 24, 1950 (69 y.o. M) Primary Care Provider: Nicoletta Ba Other Clinician: Referring Provider: Treating Provider/Extender:[name], [name], [name] in Treatment: 210 Information Obtained from: Patient Chief Complaint patient is here for evaluation venous/lymphedema weeping Electronic Signature(s) Signed: 02/02/2020 10:55:55 AM By: Lenda Kelp PA-C Entered By: Lenda Kelp on 02/02/2020 10:55:55 -------------------------------------------------------------------------------- HPI Details Patient Name: Date of Service: [name], [name] 02/02/2020 10:15 AM Medical Record [id number] Patient Account Number: [id number] Date of Birth/Sex: Treating RN: 1951-10-07 (69 y.o. M) Primary Care Provider: Nicoletta Ba Other Clinician: Referring Provider: Treating Provider/Extender:[name], [name], [name] in Treatment: 210 History of Present Illness HPI Description: Referred by PCP for consultation. Patient has long standing history of BLE venous stasis, no prior ulcerations. At beginning of month, developed cellulitis and weeping. Received IM Rocephin followed by Keflex and resolved. Wears compression stocking, appr 6 months old. Not sure strength. No present drainage. 01/22/16 this is a patient who is a type II diabetic on insulin. 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 He tells me he religiously wears pressure stockings of uncertain strength. He was here with weeping edema about 8 months ago but did not have an open wound. Roughly a month ago he had a reopening on his bilateral legs. He is been using bandages and Neosporin. He does not complain of pain. 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 Last BUN/creatinine I have is from 11/20/15 at 13 and 1.0 respectively 01/29/16; patient arrives today having tolerated the Profore wrap. He brought in his stockings and these are 18 mmHg stockings he bought from [place]. The compression here is likely inadequate. He does not complain of pain or excessive drainage she has no systemic symptoms. The wound on the right looks improved as does the one on the left although one on the left is more substantial with still tissue at risk below the actual wound area on the bilateral posterior calf 02/05/16; patient arrives with poor edema control. He states that we did put a 4 layer compression on it last week. No weight appear 5 this. 02/12/16; the area on the posterior right Has healed. The left Has a substantial wound that has necrotic surface eschar that requires a debridement with a curette. 02/16/16;the patient called or a Nurse visit secondary to increased swelling. He had been in earlier in the week with his right leg healed. He was transitioned to is on pressure stocking on the right leg with the only open wound on the left, a substantial area on the left posterior calf. Note he has a history of severe lower extremity edema, he has a history of chronic atrial fibrillation but not heart failure per my notes but I'll need to research this. He is not complaining of chest pain shortness of breath or orthopnea. The intake nurse noted blisters on the previously closed right leg 02/19/16; this is the patient's regular visit day. I see him on Friday with escalating edema new wounds on the right leg and clear signs of at least right ventricular heart failure. I increased his Lasix to 40 twice a day. He is returning currently in follow-up. States he is noticed a decrease  in that the edema 02/26/16 patient's legs have much  less edema. There is nothing really open on the right leg. The left leg has improved condition of the large superficial wound on the posterior left leg 03/04/16; edema control is very much better. The patient's right leg wounds have healed. On the left leg he continues to have severe venous inflammation on the posterior aspect of the left leg. There is no tenderness and I don't think any of this is cellulitis. 03/11/16; patient's right leg is married healed and he is in his own stocking. The patient's left leg has deteriorated somewhat. There is a lot of erythema around the wound on the posterior left leg. There is also a significant rim of erythema posteriorly just above where the wrap would've ended there is a new wound in this location and a lot of tenderness. Can't rule out cellulitis in this area. 03/15/16; patient's right leg remains healed and he is in his own stocking. The patient's left leg is much better than last review. His major wound on the posterior aspect of his left Is almost fully epithelialized. He has 3 small injuries from the wraps. Really. Erythema seems a lot better on antibiotics 03/18/16; right leg remains healed and he is in his own stocking. The patient's left leg is much better. The area on the posterior aspect of the left calf is fully epithelialized. His 3 small injuries which were wrap injuries on the left are improved only one seems still open his erythema has resolved 03/25/16; patient's right leg remains healed and he is in his own stocking. There is no open area today on the left leg posterior leg is completely closed up. His wrap injuries at the superior aspect of his leg are also resolved. He looks as though he has some irritation on the dorsal ankle but this is fully epithelialized without evidence of infection. 03/28/16; we discharged this patient on Monday. Transitioned him into his own stocking. There were problems almost immediately with uncontrolled swelling  weeping edema multiple some of which have opened. He does not feel systemically unwell in particular no chest pain no shortness of breath and he does not feel 04/08/16; the edema is under better control with the Profore light wrap but he still has pitting edema. There is one large wound anteriorly 2 on the medial aspect of his left leg and 3 small areas on the superior posterior calf. Drainage is not excessive he is tolerating a Profore light well 04/15/16; put a Profore wrap on him last week. This is controlled is edema however he had a lot of pain on his left anterior foot most of his wounds are healed 04/22/16 once again the patient has denuded areas on the left anterior foot which he states are because his wrap slips up word. He saw his primary physician today is on Lasix 40 twice a day and states that he his weight is down 20 pounds over the last 3 months. 04/29/16: Much improved. left anterior foot much improved. He is now on Lasix 80 mg per day. Much improved edema control 05/06/16; I was hoping to be able to discharge him today however once again he has blisters at a low level of where the compression was placed last week mostly on his left lateral but also his left medial leg and a small area on the anterior part of the left foot. 05/09/16; apparently the patient went home after his appointment on 7/4 later in the evening developing pain  in his upper medial thigh together with subjective fever and chills although his temperature was not taken. The pain was so intense he felt he would probably have to call 911. However he then remembered that he had leftover doxycycline from a previous round of antibiotics and took these. By the next morning he felt a lot better. He called and spoke to one of our nurses and I approved doxycycline over the phone thinking that this was in relation to the wounds we had previously seen although they were definitely were not. The patient feels a lot better old fever no  chills he is still working. Blood sugars are reasonably controlled 05/13/16; patient is back in for review of his cellulitis on his anterior medial upper thigh. He is taking doxycycline this is a lot better. Culture I did of the nodular area on the dorsal aspect of his foot grew MRSA this also looks a lot better. 05/20/16; the patient is cellulitis on the medial upper thigh has resolved. All of his wound areas including the left anterior foot, areas on the medial aspect of the left calf and the lateral aspect of the calf at all resolved. He has a new blister on the left dorsal foot at the level of the fourth toe this was excised. No evidence of infection 05/27/16; patient continues to complain weeping edema. He has new blisterlike wounds on the left anterior lateral and posterior lateral calf at the top of his wrap levels. The area on his left anterior foot appears better. He is not complaining of fever, pain or pruritus in his feet. 05/30/16; the patient's blisters on his left anterior leg posterior calf all look improved. He did not increase the Lasix 100 mg as I suggested because he was going to run out of his 40 mg tablets. He is still having weeping edema of his toes 06/03/16; I renewed his Lasix at 80 mg once a day as he was about to run out when I last saw him. He is on 80 mg of Lasix now. I have asked him to cut down on the excessive amount of water he was drinking and asked him to drink according to his thirst mechanisms 06/12/2016 -- was seen 2 days ago and was supposed to wear his compression stockings at home but he is developed lymphedema and superficial blisters on the left lower extremity and hence came in for a review 06/24/16; the remaining wound is on his left anterior leg. He still has edema coming from between his toes. There is lymphedema here however his edema is generally better than when I last saw this. He has a history of atrial fibrillation but does not have a known history  of congestive heart failure nevertheless I think he probably has this at least on a diastolic basis. 07/01/16 I reviewed his echocardiogram from January 2017. This was essentially normal. He did not have LVH, EF of 55-60%. His right ventricular function was normal although he did have trivial tricuspid and pulmonic regurgitation. This is not audible on exam however. I increased his Lasix to do massive edema in his legs well above his knees I think in early July. He was also drinking an excessive amount of water at the time. 07/15/16; missed his appointment last week because of the Labor Day holiday on Monday. He could not get another appointment later in the week. Started to feel the wrap digging in superiorly so we remove the top half and the bottom half of his wrap.  He has extensive erythema and blistering superiorly in the left leg. Very tender. Very swollen. Edema in his foot with leaking edema fluid. He has not been systemically unwell 07/22/16; the area on the left leg laterally required some debridement. The medial wounds look more stable. His wrap injury wounds appear to have healed. Edema and his foot is better, weeping edema is also better. He tells me he is meeting with the supplier of the external compression pumps at work 08/05/16; the patient was on vacation last week in [facility]. His wrap is been on for an extended period of time. Also over the weekend he developed an extensive area of tender erythema across his anterior medial thigh. He took to doxycycline yesterday that he had leftover from a previous prescription. The patient complains of weeping edema coming out of his toes 08/08/16; I saw this patient on 10/2. He was tender across his anterior thigh. I put him on doxycycline. He returns today in follow-up. He does not have any open wounds on his lower leg, he still has edema weeping into his toes. 08/12/16; patient was seen back urgently today to follow-up for his extensive left  thigh cellulitis/erysipelas. He comes back with a lot less swelling and erythema pain is much better. I believe I gave him Augmentin and Cipro. His wrap was cut down as he stated a roll down his legs. He developed blistering above the level of the wrap that remained. He has 2 open blisters and 1 intact. 08/19/16; patient is been doing his primary doctor who is increased his Lasix from 40-80 once a day or 80 already has less edema. Cellulitis has remained improved in the left thigh. 2 open areas on the posterior left calf 08/26/16; he returns today having new open blisters on the anterior part of his left leg. He has his compression pumps but is not yet been shown how to use some vital representative from the supplier. 09/02/16 patient returns today with no open wounds on the left leg. Some maceration in his plantar toes 09/10/2016 -- Dr. Leanord Hawking had recently discharged him on 09/02/2016 and he has come right back with redness swelling and some open ulcers on his left lower extremity. He says this was caused by trying to apply his compression stockings and he's been unable to use this and has not been able to use his lymphedema pumps. He had some doxycycline leftover and he has started on this a few days ago. 09/16/16; there are no open wounds on his leg on the left and no evidence of cellulitis. 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 He does not complain of symptoms here. I am not clear use using his external compression pumps. 09/23/16; I have not seen this patient in 2 weeks. He canceled his appointment 10 days ago as he was going on vacation. He tells me that on Monday he noticed a large area on his posterior left leg which is been draining copiously and is reopened into a large wound. He is been using ABDs and the external part of his juxtalite, according to our nurse this was not on properly. 10/07/16; Still a substantial area on the  posterior left leg. Using silver alginate 10/14/16; in general better although there is still open area which looks healthy. Still using silver alginate. He reminds me that this happen before he left for [facility]. Today while he was showering in the morning. He had been using  his juxtalite's 10/21/16; the area on his posterior left leg is fully epithelialized. However he arrives today with a large area of tender erythema in his medial and posterior left thigh just above the knee. I have marked the area. Once again he is reluctant to consider hospitalization. I treated him with oral antibiotics in the past for a similar situation with resolution I think with doxycycline however this area it seems more extensive to me. He is not complaining of fever but does have chills and says states he is thirsty. His blood sugar today was in the 140s at home 10/25/16 the area on his posterior left leg is fully epithelialized although there is still some weeping edema. The large area of tenderness and erythema in his medial and posterior left thigh is a lot less tender although there is still a lot of swelling in this thigh. He states he feels a lot better. He is on doxycycline and Augmentin that I started last week. This will continued until Tuesday, December 26. I have ordered a duplex ultrasound of the left thigh rule out DVT whether there is an abscess something that would need to be drained I would also like to know. 11/01/16; he still has weeping edema from a not fully epithelialized area on his left posterior calf. Most of the rest of this looks a lot better. He has completed his antibiotics. His thigh is a lot better. Duplex ultrasound did not show a DVT in the thigh 11/08/16; he comes in today with more Denuded surface epithelium from the posterior aspect of his calf. There is no real evidence of cellulitis. The superior aspect of his wrap appears to have put quite an indentation in his leg just below  the knee and this may have contributed. He does not complain of pain or fever. We have been using silver alginate as the primary dressing. The area of cellulitis in the right thigh has totally resolved. He has been using his compression stockings once a week 11/15/16; the patient arrives today with more loss of epithelium from the posterior aspect of his left calf. He now has a fairly substantial wound in this area. The reason behind this deterioration isn't exactly clear although his edema is not well controlled. He states he feels he is generally more swollen systemically. He is not complaining of chest pain shortness of breath fever. Tells me he has an appointment with his primary physician in early February. He is on 80 mg of oral Lasix a day. He claims compliance with the external compression pumps. He is not having any pain in his legs similar to what he has with his recurrent cellulitis 11/22/16; the patient arrives a follow-up of his large area on his left lateral calf. This looks somewhat better today. He came in earlier in the week for a dressing change since I saw him a week ago. He is not complaining of any pain no shortness of breath no chest pain 11/28/16; the patient arrives for follow-up of his large area on the left lateral calf this does not look better. In fact it is larger weeping edema. The surface of the wound does not look too bad. We have been using silver alginate although I'm not certain that this is a dressing issue. 12/05/16; again the patient follows up for a large wound on the left lateral and left posterior calf this does not look better. There continues to be weeping edema necrotic surface tissue. More worrisome than this once again there  is erythema below the wound involving the distal Achilles and heel suggestive of cellulitis. He is on his feet working most of the day of this is not going well. We are changing his dressing twice a week to facilitate the  drainage. 12/12/16; not much change in the overall dimensions of the large area on the left posterior calf. This is very inflamed looking. I gave him an. Doxycycline last week does not really seem to have helped. He found the wrap very painful indeed it seems to of dog into his legs superiorly and perhaps around the heel. He came in early today because the drainage had soaked through his dressings. 12/19/16- patient arrives for follow-up evaluation of his left lower extremity ulcers. He states that he is using his lymphedema pumps once daily when there is "no drainage". He admits to not using his lipedema pumps while under current treatment. His blood sugars have been consistently between 150-200. 12/26/16; the patient is not using his compression pumps at home because of the wetness on his feet. I've advised him that I think it's important for him to use this daily. He finds his feet too wet, he can put a plastic bag over his legs while he is in the pumps. Otherwise I think will be in a vicious circle. We are using silver alginate to the major area on his left posterior calf 01/02/17; the patient's posterior left leg has further of all into 3 open wounds. All of them covered with a necrotic surface. He claims to be using his compression pumps once a day. His edema control is marginal. Continue with silver alginate 01/10/17; the patient's left posterior leg actually looks somewhat better. There is less edema, less erythema. Still has 3 open areas covered with a necrotic surface requiring debridement. He claims to be using his compression pumps once a day his edema control is better 01/17/17; the patient's left posterior calf look better last week when I saw him and his wrap was changed 2 days ago. He has noted increasing pain in the left heel and arrives today with much larger wounds extensive erythema extending down into the entire heel area especially tender medially. He is not systemically unwell CBGs  have been controlled no fever. Our intake nurse showed me limegreen drainage on his AVD pads. 01/24/17; his usual this patient responds nicely to antibiotics last week giving him Levaquin for presumed Pseudomonas. The whole entire posterior part of his leg is much better much less inflamed and in the case of his Achilles heel area much less tender. He has also had some epithelialization posteriorly there are still open areas here and still draining but overall considerably better 01/31/17- He has continue to tolerate the compression wraps. he states that he continues to use the lymphedema pumps daily, and can increase to twice daily on the weekends. He is voicing no complaints or concerns regarding his LLE ulcers 02/07/17-he is here for follow-up evaluation. He states that he noted some erythema to the left medial and anterior thigh, which he states is new as of yesterday. He is concerned about recurrent cellulitis. He states his blood sugars have been slightly elevated, this morning in the 180s 02/14/17; he is here for follow-up evaluation. When he was last here there was erythema superiorly from his posterior wound in his anterior thigh. He was prescribed Levaquin however a culture of the wound surface grew MRSA over the phone I changed him to doxycycline on Monday and things seem to be a  lot better. 02/24/17; patient missed his appointment on Friday therefore we changed his nurse visit into a physician visit today. Still using silver alginate on the large area of the posterior left thigh. He isn't new area on the dorsal left second toe 03/03/17; actually better today although he admits he has not used his external compression pumps in the last 2 days or so because of work responsibilities over the weekend. 03/10/17; continued improvement. External compression pumps once a day almost all of his wounds have closed on the posterior left calf. Better edema control 03/17/17; in general improved. He still  has 3 small open areas on the lateral aspect of his left leg however most of the area on the posterior part of his leg is epithelialized. He has better edema control. He has an ABD pad under his stocking on the right anterior lower leg although he did not let us look at that today. 03/24/17; patient arrives back in clinic today with no open areas however there are areas on the posterior left calf and anterior left calf that are less than 100% epithelialized. His edema is well controlled in the left lower leg. There is some pitting edema probably lymphedema in the left upper thigh. He uses compression pumps at home once per day. I tried to get him to do this twice a day although he is very reticent. 04/01/2017 -- for the last 2 days he's had significant redness, tenderness and weeping and came in for an urgent visit today. 04/07/17; patient still has 6 more days of doxycycline. He was seen by Dr. Meyer Russel last Wednesday for cellulitis involving the posterior aspect, lateral aspect of his Involving his heel. For the most part he is better there is less erythema and less weeping. He has been on his feet for 12 hours 2 over the weekend. Using his compression pumps once a day 04/14/17 arrives today with continued improvement. Only one area on the posterior left calf that is not fully epithelialized. He has intense bilateral venous inflammation associated with his chronic venous insufficiency disease and secondary lymphedema. We have been using silver alginate to the left posterior calf wound In passing he tells [place] today that the right leg but we have not seen in quite some time has an open area on it but he doesn't want [place] to look at this today states he will show this to [place] next week. 04/21/17; there is no open area on his left leg although he still reports some weeping edema. He showed [place] his right leg today which is the first time we've seen this leg in a long time. He has a large area of open wound on the  right leg anteriorly healthy granulation. Quite a bit of swelling in the right leg and some degree of venous inflammation. He told us about the right leg in passing last week but states that deterioration in the right leg really only happened over the weekend 04/28/17; there is no open area on the left leg although there is an irritated part on the posterior which is like a wrap injury. The wound on the right leg which was new from last week at least to [place] is a lot better. 05/05/17; still no open area on the left leg. Patient is using his new compression stocking which seems to be doing a good job of controlling the edema. He states he is using his compression pumps once per day. The right leg still has an open wound although it  is better in terms of surface area. Required debridement. A lot of pain in the posterior right Achilles marked tenderness. Usually this type of presentation this patient gives concern for an active cellulitis 05/12/17; patient arrives today with his major wound from last week on the right lateral leg somewhat better. Still requiring debridement. He was using his compression stocking on the left leg however that is reopened with superficial wounds anteriorly he did not have an open wound on this leg previously. He is still using his juxta light's once daily at night. He cannot find the time to do this in the morning as he has to be at work by 7 AM 05/19/17; right lateral leg wound looks improved. No debridement required. The concerning area is on the left posterior leg which appears to almost have a subcutaneous hemorrhagic component to it. We've been using silver alginate to all the wounds 05/26/17; the right lateral leg wound continues to look improved. However the area on the left posterior calf is a tightly adherent surface. [place] using silver alginate. Because of the weeping edema in his legs there is very little good alternatives. 06/02/17; the patient left here last week  looking quite good. Major wound on the left posterior calf and a small one on the right lateral calf. Both of these look satisfactory. He tells me that by Wednesday he had noted increased pain in the left leg and drainage. He called on Thursday and Friday to get an appointment here but we were blocked. He did not go to urgent care or his primary physician. He thinks he had a fever on Thursday but did not actually take his temperature. He has not been using his compression pumps on the left leg because of pain. I advised him to go to the emergency room today for IV antibiotics for stents of left leg cellulitis but he has refused I have asked him to take 2 days off work to keep his leg elevated and he has refused this as well. In view of this I'm going to call him and Augmentin and doxycycline. 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 However after a detailed discussion regarding the risk benefits and alternatives he refuses to get admitted to the hospital. With no other recourse I will continue him on oral antibiotics as before and hopefully he'll have his infectious disease consultation this week. 06/16/2017 -- the patient was seen today by the nurse practitioner at infectious disease Ms. Dixon. 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 He has also been advise Lotrimin cream locally. she also had wise IV antibiotics for his condition if it flares up 06/23/17; patient arrives today with drainage bilaterally although the remaining wound on the left posterior calf after cleaning up  today "highlighter yellow drainage" did not look too bad. Unfortunately he has had breakdown on the right anterior leg [previously this leg had not been open and he is using a black stocking] he went to see infectious disease and is been put on clindamycin 150 mg daily, I did not verify the  dose although I'm not familiar with using clindamycin in this dosing range, perhaps for prophylaxisoo 06/27/17; I brought this patient back today to follow-up on the wound deterioration on the right lower leg together with surrounding cellulitis. I started him on doxycycline 4 days ago. This area looks better however he comes in today with intense cellulitis on the medial part of his left thigh. This is not have a wound in this area. Extremely tender. We've been using silver alginate to the wounds on the right lower leg left lower leg with bilateral 4 layer compression he is using his external compression pumps once a day 07/04/17; patient's left medial thigh cellulitis looks better. He has not been using his compression pumps as his insert said it was contraindicated with cellulitis. His right leg continues to make improvements all the wounds are still open. We only have one remaining wound on the left posterior calf. Using silver alginate to all open areas. He is on doxycycline which I started a week ago and should be finishing I gave him Augmentin after Thursday's visit for the severe cellulitis on the left medial thigh which fortunately looks better 07/14/17; the patient's left medial thigh cellulitis has resolved. The cellulitis in his right lower calf on the right also looks better. All of his wounds are stable to improved we've been using silver alginate he has completed the antibiotics I have given him. He has clindamycin 150 mg once a day prescribed by infectious disease for prophylaxis, I've advised him to start this now. We have been using bilateral Unna boots over silver alginate to the wound  areas 07/21/17; the patient is been to see infectious disease who noted his recurrent problems with cellulitis. He was not able to tolerate prophylactic clindamycin therefore he is on amoxicillin 500 twice a day. He also had a second daily dose of Lasix added By Dr. Melford Aase but he is not taking this. Nor is he being completely compliant with his compression pumps a especially not this week. He has 2 remaining wounds one on the right posterior lateral lower leg and one on the left posterior medial lower leg. 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 The patient has Unna boots bilaterally. 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 He has bilateral Unna boots for compression and silver alginate to his wounds. Arrives today with his legs looking as good as I have seen him in quite some time. Not surprisingly his wounds look better as well with improvement on the right lateral leg venous insufficiency wound and also the left medial leg. He is still using the compression pumps once a day 08/11/17; both legs appear to be doing better wounds on the right lateral and left medial legs look better. Skin on the right leg quite good. He is been using silver alginate as the primary dressing. I'm going to use Anasept gel calcium alginate and maintain all the secondary dressings 08/18/17; the patient continues to actually do quite well. The area on his right lateral leg is just about closed the left medial also looks better although it is still moist in this area. His edema is well controlled we have been using Anasept gel with calcium alginate and the usual secondary dressings, 4 layer compression and once daily use of his compression  pumps "always been able to manage 09/01/17; the  patient continues to do reasonably well in spite of his trip to [place]. The area on the right lateral leg is epithelialized. Left is much better but still open. He has more edema and more chronic erythema on the left leg [venous inflammation] 09/08/17; he arrives today with no open wound on the right lateral leg and decently controlled edema. Unfortunately his left leg is not nearly as in his good situation as last week.he apparently had increasing edema starting on Saturday. He edema soaked through into his foot so used a plastic bag to walk around his home. The area on the medial right leg which was his open area is about the same however he has lost surface epithelium on the left lateral which is new and he has significant pain in the Achilles area of the left foot. He is already on amoxicillin chronically for prophylaxis of cellulitis in the left leg 09/15/17; he is completed a week of doxycycline and the cellulitis in the left posterior leg and Achilles area is as usual improved. He still has a lot of edema and fluid soaking through his dressings. There is no open wound on the right leg. He saw infectious disease NP today 09/22/17;As usual 1 we transition him from our compression wraps to his stockings things did not go well. He has several small open areas on the right leg. He states this was caused by the compression wrap on his skin although he did not wear this with the stockings over them. He has several superficial areas on the left leg medially laterally posteriorly. He does not have any evidence of active cellulitis especially involving the left Achilles The patient is traveling from [facility] Saturday going to [facility]. He states he isn't attempting to get an appointment with a heel objects wound center there to change his dressings. I am not completely certain whether this will work 10/06/17; the patient came in on Friday for a nurse visit and the nurse reported that his legs  actually look quite good. He arrives in clinic today for his regular follow-up visit. He has a new wound on his left third toe over the PIP probably caused by friction with his footwear. He has small areas on the left leg and a very superficial but epithelialized area on the right anterior lateral lower leg. Other than that his legs look as good as I've seen him in quite some time. We have been using silver alginate Review of systems; no chest pain no shortness of breath other than this a 10 point review of systems negative 10/20/17; seen by Dr. Meyer Russel last week. He had taken some antibiotics [doxycycline] that he had left over. Dr. Meyer Russel thought he had candida infection and declined to give him further antibiotics. He has a small wound remaining on the right lateral leg several areas on the left leg including a larger area on the left posterior several left medial and anterior and a small wound on the left lateral. The area on the left dorsal third toe looks a lot better. ROS; Gen.; no fever, respiratory no cough no sputum Cardiac no chest pain other than this 10 point review of system is negative 10/30/17; patient arrives today having fallen in the bathtub 3 days ago. It took him a while to get up. He has pain and maceration in the wounds on his left leg which have deteriorated. He has not been using his pumps he also has some  maceration on the right lateral leg. 11/03/17; patient continues to have weeping edema especially in the left leg. This saturates his dressings which were just put on on 12/27. As usual the doxycycline seems to take care of the cellulitis on his lower leg. He is not complaining of fever, chills, or other systemic symptoms. He states his leg feels a lot better on the doxycycline I gave him empirically. He also apparently gets injections at his primary doctor's officeo Rocephin for cellulitis prophylaxis. I didn't ask him about his compression pump compliance today I think  that's probably marginal. Arrives in the clinic with all of his dressings primary and secondary macerated full of fluid and he has bilateral edema 11/10/17; the patient's right leg looks some better although there is still a cluster of wounds on the right lateral. The left leg is inflamed with almost circumferential skin loss medially to laterally although we are still maintaining anteriorly. He does not have overt cellulitis there is a lot of drainage. He is not using compression pumps. We have been using silver alginate to the wound areas, there are not a lot of options here 11/17/17; the patient's right leg continues to be stable although there is still open wounds, better than last week. The inflammation in the left leg is better. Still loss of surface layer epithelium especially posteriorly. There is no overt cellulitis in the amount of edema and his left leg is really quite good, tells me he is using his compression pumps once a day. 11/24/17; patient's right leg has a small superficial wound laterally this continues to improve. The inflammation in the left leg is still improving however we have continuous surface layer epithelial loss posteriorly. There is no overt cellulitis in the amount of edema in both legs is really quite good. He states he is using his compression pumps on the left leg once a day for 5 out of 7 days 12/01/17; very small superficial areas on the right lateral leg continue to improve. Edema control in both legs is better today. He has continued loss of surface epithelialization and left posterior calf although I think this is better. We have been using silver alginate with large number of absorptive secondary dressings 4 layer on the left Unna boot on the right at his request. He tells me he is using his compression pumps once a day 12/08/17; he has no open area on the right leg is edema control is good here. On the left leg however he has marked erythema and tenderness  breakdown of skin. He has what appears to be a wrap injury just distal to the popliteal fossa. 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 There was swelling erythema and breakdown in the right leg. He has much worse skin breakdown in the left leg as well multiple open areas medially and posteriorly as well as laterally. He tells me he has been using his compression pumps but tells me he feels that the drainage out of his leg is worse when he uses a compression pumps. To be fair to him he is been saying this for a while however I don't know that I have really been listening to this. I wonder if the compression pumps are  working properly 12/22/17;. Once again he arrives with severe erythema, weeping edema from the left greater than right leg. Noncompliance with compression pumps. New this visit he is complaining of pain on the lateral aspect of the right leg and the medial aspect of his right thigh. He apparently saw his cardiologist Dr. [name] who was ordered an echocardiogram area and I think this is a step in the right direction 12/25/17; started his doxycycline Monday night. There is still intense erythema of the right leg especially in the anterior thigh although there is less tenderness. The erythema around the wound on the right lateral calf also is less tender. He still complaining of pain in the left heel. His wounds are about the same right lateral left medial left lateral. Superficial but certainly not close to closure. He denies being systemically unwell no fever chills no abdominal pain no diarrhea 12/29/17; back in follow-up of his extensive right calf and right thigh cellulitis. I added amoxicillin to cover  possible doxycycline resistant strep. This seems to of done the trick he is in much less pain there is much less erythema and swelling. He has his echocardiogram at 11:00 this morning. X-ray of the left heel was also negative. 01/05/18; the patient arrived with his edema under much better control. Now that he is retired he is able to use his compression pumps daily and sometimes twice a day per the patient. He has a wound on the right leg the lateral wound looks better. Area on the left leg also looks a lot better. He has no evidence of cellulitis in his bilateral thighs I had a quick peak at his echocardiogram. He is in normal ejection fraction and normal left ventricular function. He has moderate pulmonary hypertension moderately reduced right ventricular function. One would have to wonder about chronic sleep apnea although he says he doesn't snore. He'll review the echocardiogram with his cardiologist. 01/12/18; the patient arrives with the edema in both legs under exemplary control. He is using his compression pumps daily and sometimes twice daily. His wound on the right lateral leg is just about closed. He still has some weeping areas on the posterior left calf and lateral left calf although everything is just about closed here as well. I have spoken with Aldean Baker who is the patient's nurse practitioner and infectious disease. She was concerned that the patient had not understood that the parenteral penicillin injections he was receiving for cellulitis prophylaxis was actually benefiting him. I don't think the patient actually saw that I would tend to agree we were certainly dealing with less infections although he had a serious one last month. 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 He is healed. He'll be placed in juxtalite compression wraps and increase his lymphedema pumps to twice daily. We will follow up again next week to ensure there are no  issues with the new regiment. 01/20/18-he is here for evaluation of bilateral lower extremity weeping edema. Yesterday he was placed in compression wrap to the right lower extremity and compression stocking to left lower shrubbery. He states he uses lymphedema pumps last night and again this morning and noted a blister to the left lower extremity. On exam he was noted to have drainage to the right lower extremity. He will be placed in Unna boots bilaterally and follow-up next week 01/26/18; patient was actually discharged a week ago to his own juxta light stockings only to return the next day with bilateral lower  extremity weeping edema.he was placed in bilateral Unna boots. He arrives today with pain in the back of his left leg. There is no open area on the right leg however there is a linear/wrap injury on the left leg and weeping edema on the left leg posteriorly. I spoke with infectious disease about 10 days ago. They were disappointed that the patient elected to discontinue prophylactic intramuscular penicillin shots as they felt it was particularly beneficial in reducing the frequency of his cellulitis. I discussed this with the patient today. He does not share this view. He'll definitely need antibiotics today. Finally he is traveling to [place] and trauma leaving this Saturday and returning a week later and he does not travel with his pumps. He is going by car 01/30/18; patient was seen 4 days ago and brought back in today for review of cellulitis in the left leg posteriorly. I put him on amoxicillin this really hasn't helped as much as I might like. He is also worried because he is traveling to [facility] trauma by car. Finally we will be rewrapping him. There is no open area on the right leg over his left leg has multiple weeping areas as usual 02/09/18; The same wrap on for 10 days. He did not pick up the last doxycycline I prescribed for him. He apparently took 4 days worth he already had.  There is nothing open on his right leg and the edema control is really quite good. He's had damage in the left leg medially and laterally especially probably related to the prolonged use of Unna boots 02/12/18; the patient arrived in clinic today for a nurse visit/wrap change. He complained of a lot of pain in the left posterior calf. He is taking doxycycline that I previously prescribed for him. Unfortunately even though he used his stockings and apparently used to compression pumps twice a day he has weeping edema coming out of the lateral part of his right leg. This is coming from the lower anterior lateral skin area. 02/16/18; the patient has finished his doxycycline and will finish the amoxicillin 2 days. The area of cellulitis in the left calf posteriorly has resolved. He is no longer having any pain. He tells me he is using his compression pumps at least once a day sometimes twice. 02/23/18; the patient finished his doxycycline and Amoxil last week. On Friday he noticed a small erythematous circle about the size of a quarter on the left lower leg just above his ankle. This rapidly expanded and he now has erythema on the lateral and posterior part of the thigh. This is bright red. Also has an area on the dorsal foot just above his toes and a tender area just below the left popliteal fossa. He came off his prophylactic penicillin injections at his own insistence one or 2 months ago. This is obviously deteriorated since then 03/02/18; patient is on doxycycline and Amoxil. Culture I did last week of the weeping area on the back of his left calf grew group B strep. I have therefore renewed the amoxicillin 500 3 times a day for a further week. He has not been systemically unwell. Still complaining of an area of discomfort right under his left popliteal fossa. There is no open wound on the right leg. He tells me that he is using his pumps twice a day on most days 03/09/18; patient arrives in clinic  today completing his amoxicillin today. The cellulitis on his left leg is better. Furthermore he tells me that  he had intramuscular penicillin shots that his primary care office today. However he also states that the wrap on his right leg fell down shortly after leaving clinic last week. He developed a large blister that was present when he came in for a nurse visit later in the week and then he developed intense discomfort around this area.He tells me he is using his compression pumps 03/16/18; the patient has completed his doxycycline. The infectious part of this/cellulitis in the left heel area left popliteal area is a lot better. He has 2 open areas on the right calf. Still areas on the left calf but this is a lot better as well. 03/24/18; the patient arrives complaining of pain in the left popliteal area again. He thinks some of this is wrap injury. He has no open area on the right leg and really no open area on the left calf either except for the popliteal area. He claims to be compliant with the compression pumps 03/31/18; I gave him doxycycline last week because of cellulitis in the left popliteal area. This is a lot better although the surface epithelium is denuded off and response to this. He arrives today with uncontrolled edema in the right calf area as well as a fingernail injury in the right lateral calf. There is only a few open areas on the left 04/06/18; I gave him amoxicillin doxycycline over the last 2 weeks that the amoxicillin should be completing currently. He is not complaining of any pain or systemic symptoms. The only open areas see has is on the right lateral lower leg paradoxically I cannot see anything on the left lower leg. He tells me he is using his compression pumps twice a day on most days. Silver alginate to the wounds that are open under 4 layer compression 04/13/18; he completed antibiotics and has no new complaints. Using his compression pumps. Silver alginate  that anything that's opened 04/20/18; he is using his compression pumps religiously. Silver alginate 4 layer compression anything that's opened. He comes in today with no open wounds on the left leg but 3 on the right including a new one posteriorly. He has 2 on the right lateral and one on the right posterior. He likes Unna boots on the right leg for reasons that aren't really clear we had the usual 4 layer compression on the left. It may be necessary to move to the 4 layer compression on the right however for now I left them in the Unna boots 04/27/18; he is using his compression pumps at least once a day. He has still the wounds on the right lateral calf. The area right posteriorly has closed. He does not have an open wound on the left under 4 layer compression however on the dorsal left foot just proximal to the toes and the left third toe 2 small open areas were identified 05/11/18; he has not uses compression pumps. The areas on the right lateral calf have coalesced into one large wound necrotic surface. On the left side he has one small wound anteriorly however the edema is now weeping out of a large part of his left leg. He says he wasn't using his pumps because of the weeping fluid. I explained to him that this is the time he needs to pump more 05/18/18; patient states he is using his compression pumps twice a day. The area on the right lateral large wound albeit superficial. On the left side he has innumerable number of small new wounds on  the left calf particularly laterally but several anteriorly and medially. All these appear to have healthy granulated base these look like the remnants of blisters however they occurred under compression. The patient arrives in clinic today with his legs somewhat better. There is certainly less edema, less multiple open areas on the left calf and the right anterior leg looks somewhat better as well superficial and a little smaller. However he relates pain  and erythema over the last 3-4 days in the thigh and I looked at this today. He has not been systemically unwell no fever no chills no change in blood sugar values 05/25/18; comes in today in a better state. The severe cellulitis on his left leg seems better with the Keflex. Not as tender. He has not been systemically unwell Hard to find an open wound on the left lower leg using his compression pumps twice a day The confluent wounds on his right lateral calf somewhat better looking. These will ultimately need debridement I didn't do this today. 06/01/18; the severe cellulitis on the left anterior thigh has resolved and he is completed his Keflex. There is no open wound on the left leg however there is a superficial excoriation at the base of the third toe dorsally. Skin on the bottom of his left foot is macerated looking. The left the wounds on the lateral right leg actually looks some better although he did require debridement of the top half of this wound area with an open curet 06/09/18 on evaluation today patient appears to be doing poorly in regard to his right lower extremity in particular this appears to likely be infected he has very thick purulent discharge along with a bright green tent to the discharge. This makes me concerned about the possibility of pseudomonas. He's also having increased discomfort at this point on evaluation. Fortunately there does not appear to be any evidence of infection spreading to the other location at this time. 06/16/18 on evaluation today patient appears to actually be doing fairly well. His ulcer has actually diminished in size quite significantly at this point which is good news. Nonetheless he still does have some evidence of infection he did see infectious disease this morning before coming here for his appointment. I did review the results of their evaluation and their note today. They did actually have him discontinue the Cipro and initiate treatment with  linezolid at this time. He is doing this for the next seven days and they recommended a follow-up in four months with them. He is the keep a log of the need for intermittent antibiotic therapy between now and when he falls back up with infectious disease. This will help them gaze what exactly they need to do to try and help them out. 06/23/18; the patient arrives today with no open wounds on the left leg and left third toe healed. He is been using his compression pumps twice a day. On the right lateral leg he still has a sizable wound but this is a lot better than last time I saw this. 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 Has been using silver alginate under 4 layer compression 06/30/18; the only open wound he has is on the right lateral leg and this looks healthy. No debridement is required. We have been using silver alginate. He does not have an open wound on the left leg. There is apparently some drainage  from the dorsal proximal third toe on the left although I see no open wound here. 07/03/18 on evaluation today patient was actually here just for a nurse visit rapid change. However when he was here on Wednesday for his rat change due to having been healed on the left and then developing blisters we initiated the wrap again knowing that he would be back today for [place] to reevaluate and see were at. Unfortunately he has developed some cellulitis into the proximal portion of his right lower extremity even into the region of his thigh. 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 He was placed on one as what at that point. Nonetheless he is done with that and has been tolerating it well otherwise. Doxycycline which in the past really did not seem to be effective for him. Nonetheless I think the best option may be for [place] to definitely reinitiate the  antibiotics for a longer period of time. 07/07/18; since I last saw this patient a week ago he has had a difficult time. At that point he did not have an open wound on his left leg. We transitioned him into juxta light stockings. He was apparently in the clinic the next day with blisters on the left lateral and left medial lower calf. He also had weeping edema fluid. He was put back into a compression wrap. He was also in the clinic on Friday with intense erythema in his right thigh. Per the patient he was started on Bactrim however that didn't work at all in terms of relieving his pain and swelling. He has taken 3 doxycycline that he had left over from last time and that seems to of helped. He has blistering on the right thigh as well. 07/14/18; the erythema on his right thigh has gotten better with doxycycline that he is finishing. 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 Presumably this cellulitis in the thigh was not related to that although I think this is a bit concerning going forward. He still has an area on the right lateral calf the blister on the right medial calf just below the knee that was discussed above. On the left 2 small open areas left medial and left lateral. Edema control is adequate. He is using his compression pumps twice a day 07/20/18; continued improvement in the condition of both legs especially the edema in his bilateral thighs. He tells me he is been losing weight through a combination of diet and exercise. He is using his compression pumps twice a day. So overall she made to the remaining wounds 07/27/2018; continued improvement in condition of both legs. His edema is well controlled. The area on the right lateral leg is just about closed he had one blisters show up on the medial left upper calf. We have him in 4 layer compression. He is going on a 10-day trip to [place], [place] and  [place]. He will be driving. He wants to wear Unna boots because of the lessening amount of constriction. He will not use compression pumps while he is away 08/05/18 on evaluation today patient actually appears to be doing decently well all things considered in regard to his bilateral lower extremities. The worst ulcer is actually only posterior aspect of his left lower extremity with a four layer compression wrap cut into his leg a couple weeks back. He did have  a trip and actually had [employer] wraps for the trip that he is worn since he was last here. Nonetheless he feels like the [employer] wraps actually do better for him his swelling is up a little bit but he also with his trip was not taking his Lasix on a regular set schedule like he was supposed to be. He states that obviously the reason being that he cannot drive and keep going without having to urinate too frequently which makes it difficult. He did not have his pumps with him while he was away either which I think also maybe playing a role here too. 08/13/2018; the patient only has a small open wound on the right lateral calf which is a big improvement in the last month or 2. He also has the area posteriorly just below the posterior fossa on the left which I think was a wrap injury from several weeks ago. He has no current evidence of cellulitis. He tells me he is back into his compression pumps twice a day. He also tells me that while he was at the laundromat somebody stole a section of his extremitease stockings 08/20/2018; back in the clinic with a much improved state. He only has small areas on the right lateral mid calf which is just about healed. This was is more substantial area for quite a prolonged period of time. He has a small open area on the left anterior tibia. The area on the posterior calf just below the popliteal fossa is closed today. He is using his compression pumps twice a day 08/28/2018; patient has no open wound  on the right leg. He has a smattering of open areas on the calf with some weeping lymphedema. More problematically than that it looks as though his wraps of slipped down in his usual he has very angry upper area of edema just below the right medial knee and on the right lateral calf. He has no open area on his feet. [place] I will send him in an antibiotic. We will continue to wrap the right leg. We ordered extremitease stockings for him last week and I plan to transition the right leg to a stocking when he gets home which will be in 10 days time. As usual he is very reluctant to take his pumps with him when he travels 09/07/2018; [place] He shows me a picture of his left leg in the mid part of his trip last week with intense fire engine erythema. The picture look bad enough I would have considered sending him to the hospital. Instead he went [place] They did not prescribe him antibiotics but he did take some doxycycline he had leftover from a previous visit. I had given him trimethoprim sulfamethoxazole before he left this did not work according to the patient. This is resulted in some improvement fortunately. He comes back with a large wound on the left posterior calf. Smaller area on the left anterior tibia. Denuded blisters on the dorsal left foot over his toes. Does not have much in the way of wounds on the right leg although he does have a very tender area on the right posterior area just below the popliteal fossa also suggestive of infection. He promises me he is back on his pumps twice a day 09/15/2018; the intense cellulitis in his left lower calf is a lot better. The wound area on  the posterior left calf is also so better. However he has reasonably extensive wounds on the dorsal aspect of his second and third toes and the proximal foot just at the base of the toes. There is  nothing open on the right leg 09/22/2018; the patient has excellent edema control in his legs bilaterally. He is using his external compression pumps twice a day. He has no open area on the right leg and only the areas in the left foot dorsally second and third toe area on the left side. He does not have any signs of active cellulitis. 10/06/2018; the patient has good edema control bilaterally. He has no open wound on the right leg. There is a blister in the posterior aspect of his left calf that we had to deal with today. He is using his compression pumps twice a day. There is no signs of active cellulitis. We have been using silver alginate to the wound areas. He still has vulnerable areas on the base of his left first second toes dorsally He has a his extremities stockings and we are going to transition him today into the stocking on the right leg. He is cautioned that he will need to continue to use the compression pumps twice a day. If he notices uncontrolled edema in the right leg he may need to go to 3 times a day. 10/13/2018; the patient came in for a nurse check on Friday he has a large flaccid blister on the right medial calf just below the knee. We unroofed this. He has this and a new area underneath the posterior mid calf which was undoubtedly a blister as well. He also has several small areas on the right which is the area we put his extremities stocking on. 10/19/2018; the patient went to see infectious disease this morning I am not sure if that was a routine follow-up in any case the doxycycline I had given him was discontinued and started on linezolid. He has not started this. It is easy to look at his left calf and the inflammation and think this is cellulitis however he is very tender in the tissue just below the popliteal fossa and I have no doubt that there is infection going on here. He states the problem he is having is that with the compression pumps the edema goes down and  then starts walking the wrap falls down. We will see if we can adhere this. He has 1 or 2 minuscule open areas on the right still areas that are weeping on the posterior left calf, the base of his left second and third toes 10/26/18; back today in clinic with quite of skin breakdown in his left anterior leg. This may have been infection the area below the popliteal fossa seems a lot better however tremendous epithelial loss on the left anterior mid tibia area over quite inexpensive tissue. He has 2 blisters on the right side but no other open wound here. 10/29/2018; came in urgently to see [place] today and we worked him in for review. He states that the 4 layer compression on the right leg caused pain he had to cut it down to roughly his mid calf this caused swelling above the wrap and he has blisters and skin breakdown today. As a result of the pain he has not been using his pumps. Both legs are a lot more edematous and there is a lot of weeping fluid. 11/02/18; arrives in clinic with continued difficulties in the right leg> left.  Leg is swollen and painful. multiple skin blisters and new open areas especially laterally. He has not been using his pumps on the right leg. 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 He is not systemically unwell. 11/09/2018; the patient claims he is being compliant with his pumps. He is finished the doxycycline I gave him last week. Culture I did of the wound on the right lateral leg showed a few very resistant methicillin staph aureus. This was resistant to doxycycline. Nevertheless he states the pain in the leg is a lot better which makes me wonder if the cultured organism was not really what was causing the problem nevertheless this is a very dangerous organism to be culturing out of any wound. His right leg is still a lot larger than the left. He is using an Radio broadcast assistant on this area, he blames a 4-layer compression for  causing the original skin breakdown which I doubt is true however I cannot talk him out of it. We have been using silver alginate to all of these areas which were initially blisters 11/16/2018; patient is being compliant with his external compression pumps at twice a day. Miraculously he arrives in clinic today with absolutely no open wounds. He has better edema control on the left where he has been using 4 layer compression versus wound of wounds on the right and I pointed this out to him. There is no inflammation in the skin in his lower legs which is also somewhat unusual for him. There is no open wounds on the dorsal left foot. He has extremitease stockings at home and I have asked him to bring these in next week. 11/25/18 patient's lower extremity on examination today on the left appears for the most part to be wound free. He does have an open wound on the lateral aspect of the right lower extremity but this is minimal compared to what I've seen in past. He does request that we go ahead and wrap the left leg as well even though there's nothing open just so hopefully it will not reopen in short order. 1/28; patient has superficial open wounds on the right lateral calf left anterior calf and left posterior calf. His edema control is adequate. 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 We have been using silver alginate as the primary dressing. He claims compliance with his compression pumps 2/4; patient has superficial open wounds on numerous areas of his left calf and again one on the left dorsal foot. The areas on the right lateral calf have healed. The cellulitis that I gave him doxycycline for last week is also resolved this was mostly on the left anterior calf just below the tibial tuberosity. His edema looks fairly well-controlled. He tells me he went to see his primary doctor today and had blood work  ordered 2/11; once again he has several open areas on the left calf left tibial area. Most of these are small and appear to have healthy granulation. He does not have anything open on the right. The edema and control in his thighs is pretty good which is usually a good indication he has been using his pumps as requested. 2/18; he continues to have several small areas on the left calf and left tibial area. Most of these are small healthy granulation. We put him in his stocking on the right leg last week and  he arrives with a superficial open area over the right upper tibia and a fairly large area on the right lateral tibia in similar condition. His edema control actually does not look too bad, he claims to be using his compression pumps twice a day 2/25. Continued small areas on the left calf and left tibial area. New areas especially on the right are identified just below the tibial tuberosity and on the right upper tibia itself. There are also areas of weeping edema fluid even without an obvious wound. He does not have a considerable degree of lymphedema but clearly there is more edema here than his skin can handle. He states he is using the pumps twice a day. We have an Unna boot on the right and 4 layer compression on the left. 3/3; he continues to have an area on the right lateral calf and right posterior calf just below the popliteal fossa. There is a fair amount of tenderness around the wound on the popliteal fossa but I did not see any evidence of cellulitis, could just be that the wrap came down and rubbed in this area. He does not have an open area on the left leg however there is an area on the left dorsal foot at the base of the third toe We have been using silver alginate to all wound areas 3/10; he did not have an open area on his left leg last time he was here a week ago. Today he arrives with a horizontal wound just below the tibial tuberosity and an area on the left lateral calf.  He has intense erythema and tenderness in this area. The area is on the right lateral calf and right posterior calf better than last week. We have been using silver alginate as usual 3/18 - Patient returns with 3 small open areas on left calf, and 1 small open area on right calf, the skin looks ok with no significant erythema, he continues the UNA boot on right and 4 layer compression on left. The right lateral calf wound is closed , the right posterior is small area. we will continue silver alginate to the areas. 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 He is been tolerating the dressing changes without complication. Fortunately she has made excellent improvement in regard to the overall status of his wounds. Unfortunately every time we cease wrapping him he ends up reopening in causing more significant issues at that point. Again I'm unsure of the best direction to take although I think the lymphedema clinic may be appropriate for him. 02/03/19 on evaluation today patient appears to be doing well in regard to the wounds that we saw him for last week unfortunately he has a new area on the proximal portion of his right medial/posterior lower extremity where the wrap somewhat slowed down and caused swelling and a blister to rub and open. Unfortunately this is the only opening that he has on either leg at this point. 02/17/19 on evaluation today patient's bilateral lower extremities appear to be doing well. He still completely healed in regard to the left lower extremity. In regard to the right lower extremity the area where the wrap and slid down and caused the blister still seems to be slightly open although this is dramatically better than during the last evaluation two weeks  ago. I'm very pleased with  the way this stands overall. 03/03/19 on evaluation today patient appears to be doing well in regard to his right lower extremity in general although he did have a new blister open this does not appear to be showing any evidence of active infection at this time. Fortunately there's No fevers, chills, nausea, or vomiting noted at this time. Overall I feel like he is making good progress it does feel like that the right leg will we perform the [employer] seems to do with a bit better than three layer wrap on the left which slid down on him. We may switch to doing bilateral in the book wraps. 5/4; I have not seen Mr. Johnson in quite some time. According to our case manager he did not have an open wound on his left leg last week. He had 1 remaining wound on the right posterior medial calf. He arrives today with multiple openings on the left leg probably were blisters and/or wrap injuries from Unna boots. I do not think the Unna boot's will provide adequate compression on the left. I am also not clear about the frequency he is using the compression pumps. 03/17/19 on evaluation today patient appears to be doing excellent in regard to his lower extremities compared to last week's evaluation apparently. He had gotten significantly worse last week which is unfortunate. The [employer] wrap on the left did not seem to do very well for him at all and in fact it didn't control his swelling significantly enough he had an additional outbreak. Subsequently we go back to the four layer compression wrap on the left. This is good news. At least in that he is doing better and the wound seem to be killing him. He still has not heard anything from the lymphedema clinic. 03/24/19 on evaluation today patient actually appears to be doing much better in regard to his bilateral lower Trinity as compared to last week when I saw him. Fortunately there's no signs of active infection at this time. He has been tolerating the  dressing changes without complication. Overall I'm extremely pleased with the progress and appearance in general. 04/07/19 on evaluation today patient appears to be doing well in regard to his bilateral lower extremities. His swelling is significantly down from where it was previous. With that being said he does have a couple blisters still open at this point but fortunately nothing that seems to be too severe and again the majority of the larger openings has healed at this time. 04/14/19 on evaluation today patient actually appears to be doing quite well in regard to his bilateral lower extremities in fact I'm not even sure there's anything significantly open at this time at any site. Nonetheless he did have some trouble with these wraps where they are somewhat irritating him secondary to the fact that he has noted that the graph wasn't too close down to the end of this foot in a little bit short as well up to his knee. Otherwise things seem to be doing quite well. 04/21/19 upon evaluation today patient's wound bed actually showed evidence of being completely healed in regard to both lower extremities which is excellent news. There does not appear to be any signs of active infection which is also good news. I'm very pleased in this regard. No fevers, chills, nausea, or vomiting noted at this time. 04/28/19 on evaluation today patient appears to be doing a little bit worse in regard to both lower extremities  on the left mainly due to the fact that when he went infection disease the wrap was not wrapped quite high enough he developed a blister above this. On the right he is a small open area of nothing too significant but again this is continuing to give him some trouble he has been were in the Velcro compression that he has at home. 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 He's been tolerating the dressing changes without complication.  Fortunately there's no signs of active infection at this time. No fevers, chills, nausea, or vomiting noted at this time. We have been trying to get an appointment with her lymphedema clinic in [facility] but unfortunately nobody can get them on phone with not been able to even fax information over the patient likewise is not been able to get in touch with them. Overall I'm not sure exactly what's going on here with to reach out again today. 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 Still having a lot of drainage unfortunately. He tells me especially in the left but even on the right. There's no signs of active infection which is good news we've been using so ratcheted up to this point. 05/19/19 on evaluation today patient actually appears to be doing quite well with regard to his left lower extremity which is great news. Fortunately in regard to the right lower extremity has an issues with his wrap and he subsequently did remove this from what I'm understanding. Nonetheless long story short is what he had rewrapped once he removed it subsequently had maggots underneath this wrap whenever he came in for evaluation today. With that being said they were obviously completely cleaned away by the nursing staff. The visit today which is excellent news. However he does appear to potentially have some infection around the right ankle region where the maggots were located as well. He will likely require anabiotic therapy today. 05/26/19 on evaluation today patient actually appears to be doing much better in regard to his bilateral lower extremities. I feel like the infection is under much better control. With that being said there were maggots noted when the wrap was removed yet again today. Again this could have potentially been left over from previous although at this time there does not appear to be any  signs of significant drainage there was obviously on the wrap some drainage as well this contracted gnats or otherwise. Either way I do not see anything that appears to be doing worse in my pinion and in fact I think his drainage has slowed down quite significantly likely mainly due to the fact to his infection being under better control. 06/02/2019 on evaluation today patient actually appears to be doing well with regard to his bilateral lower extremities there is no signs of active infection at this time which is great news. With that being said he does have several open areas more so on the right than the left but nonetheless these are all significantly better than previously noted. 06/09/2019 on evaluation today patient actually appears to be doing well. His wrap stayed up and he did not cause any problems he had more drainage on the right compared to the left but overall I do not see any major issues at this time which is great news. 06/16/2019 on evaluation today patient appears to be doing excellent with regard  to his lower extremities the only area that is open is a new blister that can have opened as of today on the medial ankle on the left. Other than this he really seems to be doing great I see no major issues at this point. 06/23/2019 on evaluation today patient appears to be doing quite well with regard to his bilateral lower extremities. In fact he actually appears to be almost completely healed there is a small area of weeping noted of the right lower extremity just above the ankle. Nonetheless fortunately there is no signs of active infection at this time which is good news. No fevers, chills, nausea, vomiting, or diarrhea. 8/24; the patient arrived for a nurse visit today but complained of very significant pain in the left leg and therefore I was asked to look at this. Noted that he did not have an open area on the left leg last week nevertheless this was wrapped. The patient states  that he is not been able to put his compression pumps on the left leg because of the discomfort. He has not been systemically unwell 06/30/2019 on evaluation today patient unfortunately despite being excellent last week is doing much worse with regard to his left lower extremity today. In fact he had to come in for a nurse on Monday where his left leg had to be rewrapped due to excessive weeping Dr. Leanord Hawking placed him on doxycycline at that point. Fortunately there is no signs of active infection Systemically at this time which is good news. 07/07/2019 in regard to the patient's wounds today he actually seems to be doing well with his right lower extremity there really is nothing open or draining at this point this is great news. Unfortunately the left lower extremity is given him additional trouble at this time. There does not appear to be any signs of active infection nonetheless he does have a lot of edema and swelling noted at this point as well as blistering all of which has led to a much more poor appearing leg at this time compared to where it was 2 weeks ago when it was almost completely healed. Obviously this is a little discouraging for the patient. He is try to contact the lymphedema clinic in [place] he has not been able to get through to them. 07/14/2019 on evaluation today patient actually appears to be doing slightly better with regard to his left lower extremity ulcers. Overall I do feel like at least at the top of the wrap that we have been placing this area has healed quite nicely and looks much better. The remainder of the leg is showing signs of improvement. Unfortunately in the thigh area he still has an open region on the left and again on the right he has been utilizing just a Band-Aid on an area that also opened on the thigh. Again this is an area that were not able to wrap although we did do an Ace wrap to provide some compression that something that obviously is a little  less effective than the compression wraps we have been using on the lower portion of the leg. He does have an appointment with the lymphedema clinic in [facility] on Friday. 07/21/2019 on evaluation today patient appears to be doing better with regard to his lower extremity ulcers. He has been tolerating the dressing changes without complication. Fortunately there is no signs of active infection at this time. No fevers, chills, nausea, vomiting, or diarrhea. I did receive the paperwork from the  physical therapist [place]. Subsequently I signed off on that this morning and sent that back to him for further progression with the treatment plan. 07/28/2019 on evaluation today patient appears to be doing very well with regard to his right lower extremity where I do not see any open wounds at this point. Fortunately he is feeling great as far as that is concerned as well. In regard to the left lower extremity he has been having issues with still several areas of weeping and edema although the upper leg is doing better his lower leg still I think is going require the compression wrap at this time. No fevers, chills, nausea, vomiting, or diarrhea. 08/04/2019 on evaluation today patient unfortunately is having new wounds on the right lower extremity. Again we have been using Unna boot wrap on that side. We switched him to using his juxta lite wrap at home. With that being said he tells me he has been using it although his legs extremely swollen and to be honest really does not appear that he has been. I cannot know that for sure however. Nonetheless he has multiple new wounds on the right lower extremity at this time. Obviously we will have to see about getting this rewrapped for him today. 08/11/2019 on evaluation today patient appears to be doing fairly well with regard to his wounds. He has been tolerating the dressing changes including the compression wraps without  complication. He still has a lot of edema in his upper thigh regions bilaterally he is supposed to be seeing the lymphedema clinic on the 15th of this month once his wraps arrive for the upper part of his legs. 08/18/2019 on evaluation today patient appears to be doing well with regard to his bilateral lower extremities at this point. He has been tolerating the dressing changes without complication. Fortunately there is no signs of active infection which is also good news. He does have a couple weeping areas on the first and second toe of the right foot he also has just a small area on the left foot upper leg and a small area on the left lower leg but overall he is doing quite well in my opinion. He is supposed to be getting his wraps shortly in fact tomorrow and then subsequently is seeing the lymphedema clinic next Wednesday on the 21st. Of note he is also leaving on the 25th to go on vacation for a week to the beach. For that reason and since there is some uncertainty about what there can be doing at lymphedema clinic next Wednesday I am get a make an appointment for next Friday here for us to see what we need to do for him prior to him leaving for vacation. 10/23; patient arrives in considerable pain predominantly in the upper posterior calf just distal to the popliteal fossa also in the wound anteriorly above the major wound. This is probably cellulitis and he has had this recurrently in the past. He has no open wound on the right side and he has had an Radio broadcast assistantUnna boot in that area. Finally I note that he has an area on the left posterior calf which by enlarge is mostly epithelialized. This protrudes beyond the borders of the surrounding skin in the setting of dry scaly skin and lymphedema. [place] Per his longstanding pattern, he will not take his compression pumps with him predominantly out of fear that they will be stolen. He  therefore asked that we put  a Unna boot back on the right leg. He will also contact the wound care center in [facility] to see if they can change his dressing in the mid week. 11/3; patient returned from his vacation to [facility]. He was seen on 1 occasion at their wound care center. They did a 2 layer compression system as they did not have our 4-layer wrap. I am not completely certain what they put on the wounds. They did not change the Unna boot on the right. The patient is also seeing a lymphedema specialist physical therapist in [place]. It appears that he has some compression sleeve for his thighs which indeed look quite a bit better than I am used to seeing. He pumps over these with his external compression pumps. 11/10; the patient has a new wound on the right medial thigh otherwise there is no open areas on the right. He has an area on the left leg posteriorly anteriorly and medially and an area over the left second toe. We have been using silver alginate. He thinks the injury on his thigh is secondary to friction from the compression sleeve he has. 11/17; the patient has a new wound on the right medial thigh last week. He thinks this is because he did not have a underlying stocking for his thigh juxta lite apparatus. He now has this. The area is fairly large and somewhat angry but I do not think he has underlying cellulitis. He has a intact blister on the right anterior tibial area. Small wound on the right great toe dorsally Small area on the medial left calf. 11/30; the patient does not have any open areas on his right leg and we did not take his juxta lite stocking off. However he states that on Friday his compression wrap fell down lodging around his upper mid calf area. As usual this creates a lot of problems for him. He called urgently today to be seen for a nurse visit however the nurse visit turned into a provider visit because of extreme erythema and pain in the left anterior tibia extending  laterally and posteriorly. The area that is problematic is extensive 10/06/2019 upon evaluation today patient actually appears to be doing poorly in regard to his left lower extremity. He Dr. Leanord Hawking did place him on doxycycline this past Monday apparently due to the fact that he was doing much worse in regard to this left leg. Fortunately the doxycycline does seem to be helping. Unfortunately we are still having a very difficult time getting his edema under any type of control in order to anticipate discharge at some point. The only way were really able to control his lymphedema really is with compression wraps and that has only even seemingly temporary. He has been seeing a lymphedema clinic they are trying to help in this regard but still this has been somewhat frustrating in general for the patient. 10/13/19 on evaluation today patient appears to be doing excellent with regard to his right lower extremity as far as the wounds are concerned. His swelling is still quite extensive unfortunately. He is still having a lot of drainage from the thigh areas bilaterally which is unfortunate. He's been going to lymphedema clinic but again he still really does not have this edema under control as far as his lower extremities are concern. With regard to his left lower extremity this seems to be improving and I do believe the doxycycline has been of benefit for him.  He is about to complete the doxycycline. 10/20/2019 on evaluation today patient appears to be doing poorly in regard to his bilateral lower extremities. More in the right thigh he has a lot of irritation at this site unfortunately. In regard to the left lower extremity the wrap was not quite as high it appears and does seem to have caused him some trouble as well. Fortunately there is no evidence of systemic infection though he does have some blue-green drainage which has me concerned for the possibility of Pseudomonas. He tells me he is  previously taking Cipro without complications and he really does not care for Levaquin however due to some of the side effects he has. He is not allergic to any medications specifically antibiotics that were aware of. 10/27/2019 on evaluation today patient actually does appear to be for the most part doing better when compared to last week's evaluation. With that being said he still has multiple open wounds over the bilateral lower extremities. He actually forgot to start taking the Cipro and states that he still has the whole bottle. He does have several new blisters on left lower extremity today I think I would recommend he go ahead and take the Cipro based on what I am seeing at this point. 12/30-Patient comes at 1 week visit, 4 layer compression wraps on the left and Unna boot on the right, primary dressing Xtrasorb and silver alginate. Patient is taking his Cipro and has a few more days left probably 5-6, and the legs are doing better. He states he is using his compressions devices which I believe he has 11/10/2019 on evaluation today patient actually appears to be much better than last time I saw him 2 weeks ago. His wounds are significantly improved and overall I am very pleased in this regard. Fortunately there is no signs of active infection at this time. He is just a couple of days away from completing Cipro. Overall his edema is much better he has been using his lymphedema pumps which I think is also helping at this point. 11/17/2019 on evaluation today patient appears to be doing excellent in regard to his wounds in general. His legs are swollen but not nearly as much as they have been in the past. Fortunately he is tolerating the compression wraps without complication. No fevers, chills, nausea, vomiting, or diarrhea. He does have some erythema however in the distal portion of his right lower extremity specifically around the forefoot and toes there is a little bit of warmth here as  well. 11/24/2019 on evaluation today patient appears to be doing well with regard to his right lower extremity I really do not see any open wounds at this point. His left lower extremity does have several open areas and his right medial thigh also is open. Other than this however overall the patient seems to be making good progress and I am very pleased at this point. 12/01/2019 on evaluation today patient appears to be doing poorly at this point in regard to his left lower extremity has several new blisters despite the fact that we have him in compression wraps. In fact he had a 4-layer compression wrap, his upper thigh wrapped from lymphedema clinic, and a juxta light over top of the 4 layer compression wrap the lymphedema clinic applied and despite all this he still develop blisters underneath. Obviously this does have me concerned about the fact that unfortunately despite what we are doing to try to get wounds healed he continues to  have new areas arise I do not think he is ever good to be at the point where he can realistically just use wraps at home to keep things under control. Typically when we heal him it takes about 1-2 days before he is back in the clinic with severe breakdown and blistering of his lower extremities bilaterally. This is happened numerous times in the past. Unfortunately I think that we may need some help as far as overall fluid overload to kind of limit what we are seeing and get things under better control. 12/08/2019 on evaluation today patient presents for follow-up concerning his ongoing bilateral lower extremity edema. Unfortunately he is still having quite a bit of swelling the compression wraps are controlling this to some degree but he did see Dr. [name] his cardiologist I do have that available for review today as far as the appointment was concerned that was on 12/06/2019. Obviously that she has been 2 days ago. The patient states that he is only been taking the  Lasix 80 mg 1 time a day he had told me previously he was taking this twice a day. Nonetheless Dr. [name] recommended this be up to 80 mg 2 times a day for the patient as he did appear to be fluid overloaded. With that being said the patient states he did this yesterday and he was unable to go anywhere or do anything due to the fact that he was constantly having to urinate. Nonetheless I think that this is still good to be something that is important for him as far as trying to get his edema under control at all things that he is going to be able to just expect his wounds to get under control and things to be better without going through at least a period of time where he is trying to stabilize his fluid management in general and I think increasing the Lasix is likely the first step here. It was also mentioned the possibility that the patient may require metolazone. With that being said he wanted to have the patient take Lasix twice a day first and then reevaluating 2 months to see where things stand. 12/15/2019 upon evaluation today patient appears to be doing regard to his legs although his toes are showing some signs of weeping especially on the left at this point to some degree on the right. There does not appear to be any signs of active infection and overall I do feel like the compression wraps are doing well for him but he has not been able to take the Lasix at home and the increased dose that Dr. [name] recommended. He tells me that just not go to be feasible for him. Nonetheless I think in this case he should probably send a message to Dr. [name] in order to discuss options from the standpoint of possible admission to get the fluid off or otherwise going forward. 12/22/2019 upon evaluation today patient appears to be doing fairly well with regard to his lower extremities at this point. In fact he would be doing excellent if it was not for the fact that his right anterior thigh apparently had  an allergic reaction to adhesive tape that he used. The wound itself that we have been monitoring actually appears to be healed. There is a lot of irritation at this point. 12/29/2019 upon evaluation today patient appears to be doing well in regard to his lower extremities. His left medial thigh is open and somewhat draining today but this is  the only region that is open the right has done much better with the treatment utilizing the steroid cream that I prescribed for him last week. Overall I am pleased in that regard. Fortunately there is no signs of active infection at this time. No fevers, chills, nausea, vomiting, or diarrhea. 01/05/2020 upon evaluation today patient appears to be doing more poorly in regard to his right lower extremity at this point upon evaluation today. Unfortunately he continues to have issues in this regard and I think the biggest issue is controlling his edema. This obviously is not very well controlled at this point is been recommended that he use the Lasix twice a day but he has not been able to do that. Unfortunately I think this is leading to an issue where honestly he is not really able to effectively control his edema and therefore the wounds really are not doing significantly better. I do not think that he is going to be able to keep things under good control unless he is able to control his edema much better. I discussed this again in great detail with him today. 01/12/2020 good news is patient actually appears to be doing quite well today at this point. He does have an appointment with lymphedema clinic tomorrow. His legs appear healed and the toe on the left is almost completely healed. In general I am very pleased with how things stand at this point. 01/19/2020 upon evaluation today patient appears to actually be doing well in regard to his lower extremities there is nothing open at this point. Fortunately he has done extremely well more recently. Has been seeing  lymphedema clinic as well. With that being said he has Velcro wraps for his lower legs as well as his upper legs. The only wound really is on his toe which is the right great toe and this is barely anything even there. With all that being said I think it is good to be appropriate today to go ahead and switch him over to the Velcro compression wraps. 01/26/2020 upon evaluation today patient appears to be doing worse with regard to his lower extremities after last week switch him to Velcro compression wraps. Unfortunately he lasted less than 24 hours he did not have the sock portion of his Velcro wrap on the left leg and subsequently developed a blister underneath the Velcro portion. Obviously this is not good and not what we were looking for at this point. He states the lymphedema clinic did tell him to wear the wrap for 23 hours and take him off for 1 I am okay with that plan but again right now we got a get things back under control again he may have some cellulitis noted as well. 02/02/2020 upon evaluation today patient unfortunately appears to have several areas of blistering on his bilateral lower extremities today mainly on the feet. His legs do seem to be doing somewhat better which is good news. Fortunately there is no evidence of active infection at this time. No fevers, chills, nausea, vomiting, or diarrhea. Electronic Signature(s) Signed: 02/02/2020 1:17:30 PM By: Lenda KelpStone III, Axxel Gude PA-C Entered By: Lenda KelpStone III, Bladyn Tipps on 02/02/2020 13:17:28 -------------------------------------------------------------------------------- Physical Exam Details Patient Name: Date of Service: [name], [name] 02/02/2020 10:15 AM Medical Record [id number] Patient Account Number: [id number] Date of Birth/Sex: Treating RN: 05/27/1951 (69 y.o. M) Primary Care Provider: Nicoletta BaMCGOWEN, [name] Other Clinician: Referring Provider: Treating Provider/Extender:[name], Briant CedarHoyt MCGOWEN, [name] in Treatment:  210 Constitutional Obese and well-hydrated in no acute distress.  Respiratory normal breathing without difficulty. Psychiatric this patient is able to make decisions and demonstrates good insight into disease process. Alert and Oriented x 3. pleasant and cooperative. Notes Patient's wounds currently again appear to have sealed up quite readily with regard to the openings that were noted last week. With that being said I think that he still does need to try as much as possible to elevate his legs, take the diuretics as directed, and subsequently if he does all of this I think he has a better chance of getting his legs under better control although I am not certain he is really good to be able to ever get out of the compression wraps entirely every time we do he seems to be in very short order back in a very bad state. Electronic Signature(s) Signed: 02/02/2020 1:17:59 PM By: Lenda Kelp PA-C Entered By: Lenda Kelp on 02/02/2020 13:17:56 -------------------------------------------------------------------------------- Physician Orders Details Patient Name: Date of Service: [name], [name] 02/02/2020 10:15 AM Medical Record [id number] Patient Account Number: [id number] Date of Birth/Sex: Treating RN: 1951/09/18 (69 y.o. 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 Dressing Change Frequency Do not change entire dressing for one week. - both legs Skin Barriers/Peri-Wound Care Moisturizing lotion - to legs Wound Cleansing Wound #161 Right Toe Great May shower and wash wound with soap and water. Wound #162 Right Toe Second May shower and wash wound with soap and water. Wound #163 Left,Medial Lower Leg May shower with protection. Wound #164 Right,Medial Lower Leg May shower with protection. Primary Wound Dressing Wound #161 Right Toe Great Calcium Alginate with Silver Wound #162 Right Toe Second Calcium Alginate with Silver Wound #163 Left,Medial Lower Leg Calcium Alginate with Silver Wound #164 Right,Medial Lower Leg Calcium Alginate with Silver Wound #165 Left Toe Great Calcium Alginate with Silver Wound #166 Left,Dorsal Foot Calcium Alginate with Silver Secondary Dressing Wound #161 Right Toe Great Kerlix/Rolled Gauze Dry Gauze Wound #162 Right Toe Second Kerlix/Rolled Gauze Dry Gauze Wound #163 Left,Medial Lower Leg ABD pad - to left lower leg Zetuvit or Kerramax - to left lower leg Wound #164 Right,Medial Lower Leg ABD pad Edema Control 4 layer compression: Left lower extremity Unna Boot to Right Lower Extremity Avoid standing for long periods of time Elevate legs to the level of the heart or above for 30 minutes daily and/or when sitting, a frequency of: - whenever sitting Exercise regularly Segmental Compressive Device. - lymphadema pumps 60 minutes 2 times per day Additional Orders / Instructions Other: - take lasix as prescribed by cardiologist Electronic Signature(s) Signed: 02/02/2020 6:50:57 PM By: Zenaida Deed RN, BSN Signed: 02/02/2020 10:08:08 PM By: Lenda Kelp PA-C Entered By: Zenaida Deed on 02/02/2020  11:28:41 -------------------------------------------------------------------------------- Problem List Details Patient Name: Date of Service: [name], [name] 02/02/2020 10:15 AM Medical Record [id number] Patient Account Number: [id number] Date of Birth/Sex: Treating RN: 1951/08/08 (70 y.o. M) Primary Care Provider: Nicoletta Ba  Other Clinician: Referring Provider: Treating Provider/Extender:[name], [name], [name] in Treatment: 210 Active Problems ICD-10 Evaluated Encounter Code Description Active Date Today Diagnosis L97.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 right calf limited to 06/30/2018 No Yes breakdown of skin L97.221 Non-pressure chronic ulcer of left calf limited to 09/30/2016 No Yes breakdown of skin I87.333 Chronic venous hypertension (idiopathic) with ulcer 01/22/2016 No Yes and inflammation of bilateral lower extremity I89.0 Lymphedema, not elsewhere classified 01/22/2016 No Yes E11.622 Type 2 diabetes mellitus with other skin ulcer 01/22/2016 No Yes 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, 01/22/2016 No Yes unspecified L03.116 Cellulitis of left lower limb 04/01/2017 No Yes Inactive Problems ICD-10 Code Description Active Date Inactive Date 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 06/30/2017 skin L97.521 Non-pressure chronic ulcer of other part of left foot limited to 04/27/2018 04/27/2018 breakdown of skin L03.115 Cellulitis of right lower limb 12/22/2017 12/22/2017 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 with other specified 06/30/2018 06/30/2018 severity L97.511 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 06/30/2018 06/30/2018 breakdown of skin Resolved Problems Electronic Signature(s) Signed: 02/02/2020 10:55:44 AM By: Lenda Kelp PA-C Entered By: Lenda Kelp on 02/02/2020  10:55:41 -------------------------------------------------------------------------------- Progress Note Details Patient Name: Date of Service: [name], [name] 02/02/2020 10:15 AM Medical Record [id number] Patient Account Number: [id number] Date of Birth/Sex: Treating RN: 10-Jan-1951 (69 y.o. M) Primary Care Provider: Nicoletta Ba Other Clinician: Referring Provider: Treating Provider/Extender:[name], [name], [name] in Treatment: 210 Subjective Chief Complaint Information obtained from Patient patient is here for evaluation venous/lymphedema weeping History of Present Illness (HPI) Referred by PCP for consultation. Patient has long standing history of BLE venous stasis, no prior ulcerations. At beginning of month, developed cellulitis and weeping. Received IM Rocephin followed by Keflex and resolved. Wears compression stocking, appr 6 months old. Not sure strength. No present drainage. 01/22/16 this is a patient who is a type II diabetic on insulin. 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 He tells me he religiously wears pressure stockings of uncertain strength. He was here with weeping edema about 8 months ago but did not have an open wound. Roughly a month ago he had a reopening on his bilateral legs. He is been using bandages and Neosporin. He does not complain of pain. 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 Last BUN/creatinine I have is from 11/20/15 at 13 and 1.0 respectively 01/29/16; patient arrives today having tolerated the Profore wrap. He brought in his stockings and these are 18 mmHg stockings he bought from [place]. The compression here is likely inadequate. He does not complain of pain or excessive drainage she has no systemic symptoms. The wound on the right looks improved as does the one on the left  although one on the left is more substantial with still tissue at risk below the actual wound area on the bilateral posterior calf 02/05/16; patient arrives with poor edema control. He states that we did put a 4 layer compression on it last week. No weight appear 5 this. 02/12/16; the area on the posterior right Has healed. The left Has a substantial wound that has necrotic surface eschar that requires a debridement with a curette. 02/16/16;the patient called or a Nurse visit secondary to increased swelling. He had been in earlier in the week with his right leg  healed. He was transitioned to is on pressure stocking on the right leg with the only open wound on the left, a substantial area on the left posterior calf. Note he has a history of severe lower extremity edema, he has a history of chronic atrial fibrillation but not heart failure per my notes but I'll need to research this. He is not complaining of chest pain shortness of breath or orthopnea. The intake nurse noted blisters on the previously closed right leg 02/19/16; this is the patient's regular visit day. I see him on Friday with escalating edema new wounds on the right leg and clear signs of at least right ventricular heart failure. I increased his Lasix to 40 twice a day. He is returning currently in follow-up. States he is noticed a decrease in that the edema 02/26/16 patient's legs have much less edema. There is nothing really open on the right leg. The left leg has improved condition of the large superficial wound on the posterior left leg 03/04/16; edema control is very much better. The patient's right leg wounds have healed. On the left leg he continues to have severe venous inflammation on the posterior aspect of the left leg. There is no tenderness and I don't think any of this is cellulitis. 03/11/16; patient's right leg is married healed and he is in his own stocking. The patient's left leg has deteriorated somewhat. There is a lot  of erythema around the wound on the posterior left leg. There is also a significant rim of erythema posteriorly just above where the wrap would've ended there is a new wound in this location and a lot of tenderness. Can't rule out cellulitis in this area. 03/15/16; patient's right leg remains healed and he is in his own stocking. The patient's left leg is much better than last review. His major wound on the posterior aspect of his left Is almost fully epithelialized. He has 3 small injuries from the wraps. Really. Erythema seems a lot better on antibiotics 03/18/16; right leg remains healed and he is in his own stocking. The patient's left leg is much better. The area on the posterior aspect of the left calf is fully epithelialized. His 3 small injuries which were wrap injuries on the left are improved only one seems still open his erythema has resolved 03/25/16; patient's right leg remains healed and he is in his own stocking. There is no open area today on the left leg posterior leg is completely closed up. His wrap injuries at the superior aspect of his leg are also resolved. He looks as though he has some irritation on the dorsal ankle but this is fully epithelialized without evidence of infection. 03/28/16; we discharged this patient on Monday. Transitioned him into his own stocking. There were problems almost immediately with uncontrolled swelling weeping edema multiple some of which have opened. He does not feel systemically unwell in particular no chest pain no shortness of breath and he does not feel 04/08/16; the edema is under better control with the Profore light wrap but he still has pitting edema. There is one large wound anteriorly 2 on the medial aspect of his left leg and 3 small areas on the superior posterior calf. Drainage is not excessive he is tolerating a Profore light well 04/15/16; put a Profore wrap on him last week. This is controlled is edema however he had a lot of pain on his  left anterior foot most of his wounds are healed 04/22/16 once again the patient  has denuded areas on the left anterior foot which he states are because his wrap slips up word. He saw his primary physician today is on Lasix 40 twice a day and states that he his weight is down 20 pounds over the last 3 months. 04/29/16: Much improved. left anterior foot much improved. He is now on Lasix 80 mg per day. Much improved edema control 05/06/16; I was hoping to be able to discharge him today however once again he has blisters at a low level of where the compression was placed last week mostly on his left lateral but also his left medial leg and a small area on the anterior part of the left foot. 05/09/16; apparently the patient went home after his appointment on 7/4 later in the evening developing pain in his upper medial thigh together with subjective fever and chills although his temperature was not taken. The pain was so intense he felt he would probably have to call 911. However he then remembered that he had leftover doxycycline from a previous round of antibiotics and took these. By the next morning he felt a lot better. He called and spoke to one of our nurses and I approved doxycycline over the phone thinking that this was in relation to the wounds we had previously seen although they were definitely were not. The patient feels a lot better old fever no chills he is still working. Blood sugars are reasonably controlled 05/13/16; patient is back in for review of his cellulitis on his anterior medial upper thigh. He is taking doxycycline this is a lot better. Culture I did of the nodular area on the dorsal aspect of his foot grew MRSA this also looks a lot better. 05/20/16; the patient is cellulitis on the medial upper thigh has resolved. All of his wound areas including the left anterior foot, areas on the medial aspect of the left calf and the lateral aspect of the calf at all resolved. He has a new  blister on the left dorsal foot at the level of the fourth toe this was excised. No evidence of infection 05/27/16; patient continues to complain weeping edema. He has new blisterlike wounds on the left anterior lateral and posterior lateral calf at the top of his wrap levels. The area on his left anterior foot appears better. He is not complaining of fever, pain or pruritus in his feet. 05/30/16; the patient's blisters on his left anterior leg posterior calf all look improved. He did not increase the Lasix 100 mg as I suggested because he was going to run out of his 40 mg tablets. He is still having weeping edema of his toes 06/03/16; I renewed his Lasix at 80 mg once a day as he was about to run out when I last saw him. He is on 80 mg of Lasix now. I have asked him to cut down on the excessive amount of water he was drinking and asked him to drink according to his thirst mechanisms 06/12/2016 -- was seen 2 days ago and was supposed to wear his compression stockings at home but he is developed lymphedema and superficial blisters on the left lower extremity and hence came in for a review 06/24/16; the remaining wound is on his left anterior leg. He still has edema coming from between his toes. There is lymphedema here however his edema is generally better than when I last saw this. He has a history of atrial fibrillation but does not have a known history of  congestive heart failure nevertheless I think he probably has this at least on a diastolic basis. 07/01/16 I reviewed his echocardiogram from January 2017. This was essentially normal. He did not have LVH, EF of 55-60%. His right ventricular function was normal although he did have trivial tricuspid and pulmonic regurgitation. This is not audible on exam however. I increased his Lasix to do massive edema in his legs well above his knees I think in early July. He was also drinking an excessive amount of water at the time. 07/15/16; missed his  appointment last week because of the Labor Day holiday on Monday. He could not get another appointment later in the week. Started to feel the wrap digging in superiorly so we remove the top half and the bottom half of his wrap. He has extensive erythema and blistering superiorly in the left leg. Very tender. Very swollen. Edema in his foot with leaking edema fluid. He has not been systemically unwell 07/22/16; the area on the left leg laterally required some debridement. The medial wounds look more stable. His wrap injury wounds appear to have healed. Edema and his foot is better, weeping edema is also better. He tells me he is meeting with the supplier of the external compression pumps at work 08/05/16; the patient was on vacation last week in [facility]. His wrap is been on for an extended period of time. Also over the weekend he developed an extensive area of tender erythema across his anterior medial thigh. He took to doxycycline yesterday that he had leftover from a previous prescription. The patient complains of weeping edema coming out of his toes 08/08/16; I saw this patient on 10/2. He was tender across his anterior thigh. I put him on doxycycline. He returns today in follow-up. He does not have any open wounds on his lower leg, he still has edema weeping into his toes. 08/12/16; patient was seen back urgently today to follow-up for his extensive left thigh cellulitis/erysipelas. He comes back with a lot less swelling and erythema pain is much better. I believe I gave him Augmentin and Cipro. His wrap was cut down as he stated a roll down his legs. He developed blistering above the level of the wrap that remained. He has 2 open blisters and 1 intact. 08/19/16; patient is been doing his primary doctor who is increased his Lasix from 40-80 once a day or 80 already has less edema. Cellulitis has remained improved in the left thigh. 2 open areas on the posterior left calf 08/26/16; he returns  today having new open blisters on the anterior part of his left leg. He has his compression pumps but is not yet been shown how to use some vital representative from the supplier. 09/02/16 patient returns today with no open wounds on the left leg. Some maceration in his plantar toes 09/10/2016 -- Dr. Leanord Hawking had recently discharged him on 09/02/2016 and he has come right back with redness swelling and some open ulcers on his left lower extremity. He says this was caused by trying to apply his compression stockings and he's been unable to use this and has not been able to use his lymphedema pumps. He had some doxycycline leftover and he has started on this a few days ago. 09/16/16; there are no open wounds on his leg on the left and no evidence of cellulitis. 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 He does not complain of symptoms here. I  am not clear use using his external compression pumps. 09/23/16; I have not seen this patient in 2 weeks. He canceled his appointment 10 days ago as he was going on vacation. He tells me that on Monday he noticed a large area on his posterior left leg which is been draining copiously and is reopened into a large wound. He is been using ABDs and the external part of his juxtalite, according to our nurse this was not on properly. 10/07/16; Still a substantial area on the posterior left leg. Using silver alginate 10/14/16; in general better although there is still open area which looks healthy. Still using silver alginate. He reminds me that this happen before he left for [facility]. Today while he was showering in the morning. He had been using his juxtalite's 10/21/16; the area on his posterior left leg is fully epithelialized. However he arrives today with a large area of tender erythema in his medial and posterior left thigh just above the knee. I have marked the area. Once again he is reluctant to  consider hospitalization. I treated him with oral antibiotics in the past for a similar situation with resolution I think with doxycycline however this area it seems more extensive to me. He is not complaining of fever but does have chills and says states he is thirsty. His blood sugar today was in the 140s at home 10/25/16 the area on his posterior left leg is fully epithelialized although there is still some weeping edema. The large area of tenderness and erythema in his medial and posterior left thigh is a lot less tender although there is still a lot of swelling in this thigh. He states he feels a lot better. He is on doxycycline and Augmentin that I started last week. This will continued until Tuesday, December 26. I have ordered a duplex ultrasound of the left thigh rule out DVT whether there is an abscess something that would need to be drained I would also like to know. 11/01/16; he still has weeping edema from a not fully epithelialized area on his left posterior calf. Most of the rest of this looks a lot better. He has completed his antibiotics. His thigh is a lot better. Duplex ultrasound did not show a DVT in the thigh 11/08/16; he comes in today with more Denuded surface epithelium from the posterior aspect of his calf. There is no real evidence of cellulitis. The superior aspect of his wrap appears to have put quite an indentation in his leg just below the knee and this may have contributed. He does not complain of pain or fever. We have been using silver alginate as the primary dressing. The area of cellulitis in the right thigh has totally resolved. He has been using his compression stockings once a week 11/15/16; the patient arrives today with more loss of epithelium from the posterior aspect of his left calf. He now has a fairly substantial wound in this area. The reason behind this deterioration isn't exactly clear although his edema is not well controlled. He states he feels he is  generally more swollen systemically. He is not complaining of chest pain shortness of breath fever. Tells me he has an appointment with his primary physician in early February. He is on 80 mg of oral Lasix a day. He claims compliance with the external compression pumps. He is not having any pain in his legs similar to what he has with his recurrent cellulitis 11/22/16; the patient arrives a follow-up of his  large area on his left lateral calf. This looks somewhat better today. He came in earlier in the week for a dressing change since I saw him a week ago. He is not complaining of any pain no shortness of breath no chest pain 11/28/16; the patient arrives for follow-up of his large area on the left lateral calf this does not look better. In fact it is larger weeping edema. The surface of the wound does not look too bad. We have been using silver alginate although I'm not certain that this is a dressing issue. 12/05/16; again the patient follows up for a large wound on the left lateral and left posterior calf this does not look better. There continues to be weeping edema necrotic surface tissue. More worrisome than this once again there is erythema below the wound involving the distal Achilles and heel suggestive of cellulitis. He is on his feet working most of the day of this is not going well. We are changing his dressing twice a week to facilitate the drainage. 12/12/16; not much change in the overall dimensions of the large area on the left posterior calf. This is very inflamed looking. I gave him an. Doxycycline last week does not really seem to have helped. He found the wrap very painful indeed it seems to of dog into his legs superiorly and perhaps around the heel. He came in early today because the drainage had soaked through his dressings. 12/19/16- patient arrives for follow-up evaluation of his left lower extremity ulcers. He states that he is using his lymphedema pumps once daily when there is  "no drainage". He admits to not using his lipedema pumps while under current treatment. His blood sugars have been consistently between 150-200. 12/26/16; the patient is not using his compression pumps at home because of the wetness on his feet. I've advised him that I think it's important for him to use this daily. He finds his feet too wet, he can put a plastic bag over his legs while he is in the pumps. Otherwise I think will be in a vicious circle. We are using silver alginate to the major area on his left posterior calf 01/02/17; the patient's posterior left leg has further of all into 3 open wounds. All of them covered with a necrotic surface. He claims to be using his compression pumps once a day. His edema control is marginal. Continue with silver alginate 01/10/17; the patient's left posterior leg actually looks somewhat better. There is less edema, less erythema. Still has 3 open areas covered with a necrotic surface requiring debridement. He claims to be using his compression pumps once a day his edema control is better 01/17/17; the patient's left posterior calf look better last week when I saw him and his wrap was changed 2 days ago. He has noted increasing pain in the left heel and arrives today with much larger wounds extensive erythema extending down into the entire heel area especially tender medially. He is not systemically unwell CBGs have been controlled no fever. Our intake nurse showed me limegreen drainage on his AVD pads. 01/24/17; his usual this patient responds nicely to antibiotics last week giving him Levaquin for presumed Pseudomonas. The whole entire posterior part of his leg is much better much less inflamed and in the case of his Achilles heel area much less tender. He has also had some epithelialization posteriorly there are still open areas here and still draining but overall considerably better 01/31/17- He has continue to tolerate  the compression wraps. he states that  he continues to use the lymphedema pumps daily, and can increase to twice daily on the weekends. He is voicing no complaints or concerns regarding his LLE ulcers 02/07/17-he is here for follow-up evaluation. He states that he noted some erythema to the left medial and anterior thigh, which he states is new as of yesterday. He is concerned about recurrent cellulitis. He states his blood sugars have been slightly elevated, this morning in the 180s 02/14/17; he is here for follow-up evaluation. When he was last here there was erythema superiorly from his posterior wound in his anterior thigh. He was prescribed Levaquin however a culture of the wound surface grew MRSA over the phone I changed him to doxycycline on Monday and things seem to be a lot better. 02/24/17; patient missed his appointment on Friday therefore we changed his nurse visit into a physician visit today. Still using silver alginate on the large area of the posterior left thigh. He isn't new area on the dorsal left second toe 03/03/17; actually better today although he admits he has not used his external compression pumps in the last 2 days or so because of work responsibilities over the weekend. 03/10/17; continued improvement. External compression pumps once a day almost all of his wounds have closed on the posterior left calf. Better edema control 03/17/17; in general improved. He still has 3 small open areas on the lateral aspect of his left leg however most of the area on the posterior part of his leg is epithelialized. He has better edema control. He has an ABD pad under his stocking on the right anterior lower leg although he did not let us look at that today. 03/24/17; patient arrives back in clinic today with no open areas however there are areas on the posterior left calf and anterior left calf that are less than 100% epithelialized. His edema is well controlled in the left lower leg. There is some pitting edema probably lymphedema  in the left upper thigh. He uses compression pumps at home once per day. I tried to get him to do this twice a day although he is very reticent. 04/01/2017 -- for the last 2 days he's had significant redness, tenderness and weeping and came in for an urgent visit today. 04/07/17; patient still has 6 more days of doxycycline. He was seen by Dr. Meyer Russel last Wednesday for cellulitis involving the posterior aspect, lateral aspect of his Involving his heel. For the most part he is better there is less erythema and less weeping. He has been on his feet for 12 hours o2 over the weekend. Using his compression pumps once a day 04/14/17 arrives today with continued improvement. Only one area on the posterior left calf that is not fully epithelialized. He has intense bilateral venous inflammation associated with his chronic venous insufficiency disease and secondary lymphedema. We have been using silver alginate to the left posterior calf wound In passing he tells [place] today that the right leg but we have not seen in quite some time has an open area on it but he doesn't want [place] to look at this today states he will show this to [place] next week. 04/21/17; there is no open area on his left leg although he still reports some weeping edema. He showed [place] his right leg today which is the first time we've seen this leg in a long time. He has a large area of open wound on the right leg anteriorly healthy  granulation. Quite a bit of swelling in the right leg and some degree of venous inflammation. He told us about the right leg in passing last week but states that deterioration in the right leg really only happened over the weekend 04/28/17; there is no open area on the left leg although there is an irritated part on the posterior which is like a wrap injury. The wound on the right leg which was new from last week at least to [place] is a lot better. 05/05/17; still no open area on the left leg. Patient is using his new compression  stocking which seems to be doing a good job of controlling the edema. He states he is using his compression pumps once per day. The right leg still has an open wound although it is better in terms of surface area. Required debridement. A lot of pain in the posterior right Achilles marked tenderness. Usually this type of presentation this patient gives concern for an active cellulitis 05/12/17; patient arrives today with his major wound from last week on the right lateral leg somewhat better. Still requiring debridement. He was using his compression stocking on the left leg however that is reopened with superficial wounds anteriorly he did not have an open wound on this leg previously. He is still using his juxta light's once daily at night. He cannot find the time to do this in the morning as he has to be at work by 7 AM 05/19/17; right lateral leg wound looks improved. No debridement required. The concerning area is on the left posterior leg which appears to almost have a subcutaneous hemorrhagic component to it. We've been using silver alginate to all the wounds 05/26/17; the right lateral leg wound continues to look improved. However the area on the left posterior calf is a tightly adherent surface. [place] using silver alginate. Because of the weeping edema in his legs there is very little good alternatives. 06/02/17; the patient left here last week looking quite good. Major wound on the left posterior calf and a small one on the right lateral calf. Both of these look satisfactory. He tells me that by Wednesday he had noted increased pain in the left leg and drainage. He called on Thursday and Friday to get an appointment here but we were blocked. He did not go to urgent care or his primary physician. He thinks he had a fever on Thursday but did not actually take his temperature. He has not been using his compression pumps on the left leg because of pain. I advised him to go to the emergency room  today for IV antibiotics for stents of left leg cellulitis but he has refused I have asked him to take 2 days off work to keep his leg elevated and he has refused this as well. In view of this I'm going to call him and Augmentin and doxycycline. 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 However after a detailed discussion regarding the risk benefits and alternatives he refuses to get admitted to the hospital. With no other recourse I will continue him on oral antibiotics as before and hopefully he'll have his infectious disease consultation this week. 06/16/2017 -- the patient was seen today by the nurse practitioner at infectious disease  Ms. [name]. 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 He has also been advise Lotrimin cream locally. she also had wise IV antibiotics for his condition if it flares up 06/23/17; patient arrives today with drainage bilaterally although the remaining wound on the left posterior calf after cleaning up today "highlighter yellow drainage" did not look too bad. Unfortunately he has had breakdown on the right anterior leg [previously this leg had not been open and he is using a black stocking] he went to see infectious disease and is been put on clindamycin 150 mg daily, I did not verify the dose although I'm not familiar with using clindamycin in this dosing range, perhaps for prophylaxisoo 06/27/17; I brought this patient back today to follow-up on the wound deterioration on the right lower leg together with surrounding cellulitis. I started him on  doxycycline 4 days ago. This area looks better however he comes in today with intense cellulitis on the medial part of his left thigh. This is not have a wound in this area. Extremely tender. We've been using silver alginate to the wounds on the right lower leg left lower leg with bilateral 4 layer compression he is using his external compression pumps once a day 07/04/17; patient's left medial thigh cellulitis looks better. He has not been using his compression pumps as his insert said it was contraindicated with cellulitis. His right leg continues to make improvements all the wounds are still open. We only have one remaining wound on the left posterior calf. Using silver alginate to all open areas. He is on doxycycline which I started a week ago and should be finishing I gave him Augmentin after Thursday's visit for the severe cellulitis on the left medial thigh which fortunately looks better 07/14/17; the patient's left medial thigh cellulitis has resolved. The cellulitis in his right lower calf on the right also looks better. All of his wounds are stable to improved we've been using silver alginate he has completed the antibiotics I have given him. He has clindamycin 150 mg once a day prescribed by infectious disease for prophylaxis, I've advised him to start this now. We have been using bilateral Unna boots over silver alginate to the wound areas 07/21/17; the patient is been to see infectious disease who noted his recurrent problems with cellulitis. He was not able to tolerate prophylactic clindamycin therefore he is on amoxicillin 500 twice a day. He also had a second daily dose of Lasix added By Dr. Oneta Rack but he is not taking this. Nor is he being completely compliant with his compression pumps a especially not this week. He has 2 remaining wounds one on the right posterior lateral lower leg and one on the left posterior medial lower leg. 07/28/17; maintain on Amoxil 500 twice a day as  prophylaxis for recurrent cellulitis as ordered by infectious disease. The patient has Unna boots bilaterally. 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 He has bilateral Unna boots for compression and silver alginate to his wounds. Arrives today with his legs looking as good as I have seen him in quite some time. Not surprisingly his wounds look better as well with improvement on  the right lateral leg venous insufficiency wound and also the left medial leg. He is still using the compression pumps once a day 08/11/17; both legs appear to be doing better wounds on the right lateral and left medial legs look better. Skin on the right leg quite good. He is been using silver alginate as the primary dressing. I'm going to use Anasept gel calcium alginate and maintain all the secondary dressings 08/18/17; the patient continues to actually do quite well. The area on his right lateral leg is just about closed the left medial also looks better although it is still moist in this area. His edema is well controlled we have been using Anasept gel with calcium alginate and the usual secondary dressings, 4 layer compression and once daily use of his compression pumps "always been able to manage 09/01/17; the patient continues to do reasonably well in spite of his trip to [place]. The area on the right lateral leg is epithelialized. Left is much better but still open. He has more edema and more chronic erythema on the left leg [venous inflammation] 09/08/17; he arrives today with no open wound on the right lateral leg and decently controlled edema. Unfortunately his left leg is not nearly as in his good situation as last week.he apparently had increasing edema starting on Saturday. He edema soaked through into his foot so used a plastic bag to walk around his  home. The area on the medial right leg which was his open area is about the same however he has lost surface epithelium on the left lateral which is new and he has significant pain in the Achilles area of the left foot. He is already on amoxicillin chronically for prophylaxis of cellulitis in the left leg 09/15/17; he is completed a week of doxycycline and the cellulitis in the left posterior leg and Achilles area is as usual improved. He still has a lot of edema and fluid soaking through his dressings. There is no open wound on the right leg. He saw infectious disease NP today 09/22/17;As usual 1 we transition him from our compression wraps to his stockings things did not go well. He has several small open areas on the right leg. He states this was caused by the compression wrap on his skin although he did not wear this with the stockings over them. He has several superficial areas on the left leg medially laterally posteriorly. He does not have any evidence of active cellulitis especially involving the left Achilles The patient is traveling from [facility] Saturday going to [place]. He states he isn't attempting to get an appointment with a heel objects wound center there to change his dressings. I am not completely certain whether this will work 10/06/17; the patient came in on Friday for a nurse visit and the nurse reported that his legs actually look quite good. He arrives in clinic today for his regular follow-up visit. He has a new wound on his left third toe over the PIP probably caused by friction with his footwear. He has small areas on the left leg and a very superficial but epithelialized area on the right anterior lateral lower leg. Other than that his legs look as good as I've seen him in quite some time. We have been using silver alginate Review of systems; no chest pain no shortness of breath other than this a 10 point review of systems negative 10/20/17; seen by Dr. Meyer Russel  last week. He had taken some antibiotics [doxycycline] that he  had left over. Dr. Meyer Russel thought he had candida infection and declined to give him further antibiotics. He has a small wound remaining on the right lateral leg several areas on the left leg including a larger area on the left posterior several left medial and anterior and a small wound on the left lateral. The area on the left dorsal third toe looks a lot better. ROS; Gen.; no fever, respiratory no cough no sputum Cardiac no chest pain other than this 10 point review of system is negative 10/30/17; patient arrives today having fallen in the bathtub 3 days ago. It took him a while to get up. He has pain and maceration in the wounds on his left leg which have deteriorated. He has not been using his pumps he also has some maceration on the right lateral leg. 11/03/17; patient continues to have weeping edema especially in the left leg. This saturates his dressings which were just put on on 12/27. As usual the doxycycline seems to take care of the cellulitis on his lower leg. He is not complaining of fever, chills, or other systemic symptoms. He states his leg feels a lot better on the doxycycline I gave him empirically. He also apparently gets injections at his primary doctor's officeo Rocephin for cellulitis prophylaxis. I didn't ask him about his compression pump compliance today I think that's probably marginal. Arrives in the clinic with all of his dressings primary and secondary macerated full of fluid and he has bilateral edema 11/10/17; the patient's right leg looks some better although there is still a cluster of wounds on the right lateral. The left leg is inflamed with almost circumferential skin loss medially to laterally although we are still maintaining anteriorly. He does not have overt cellulitis there is a lot of drainage. He is not using compression pumps. We have been using silver alginate to the wound areas, there are not  a lot of options here 11/17/17; the patient's right leg continues to be stable although there is still open wounds, better than last week. The inflammation in the left leg is better. Still loss of surface layer epithelium especially posteriorly. There is no overt cellulitis in the amount of edema and his left leg is really quite good, tells me he is using his compression pumps once a day. 11/24/17; patient's right leg has a small superficial wound laterally this continues to improve. The inflammation in the left leg is still improving however we have continuous surface layer epithelial loss posteriorly. There is no overt cellulitis in the amount of edema in both legs is really quite good. He states he is using his compression pumps on the left leg once a day for 5 out of 7 days 12/01/17; very small superficial areas on the right lateral leg continue to improve. Edema control in both legs is better today. He has continued loss of surface epithelialization and left posterior calf although I think this is better. We have been using silver alginate with large number of absorptive secondary dressings 4 layer on the left Unna boot on the right at his request. He tells me he is using his compression pumps once a day 12/08/17; he has no open area on the right leg is edema control is good here. ooOn the left leg however he has marked erythema and tenderness breakdown of skin. He has what appears to be a wrap injury just distal to the popliteal fossa. This is the pattern of his recurrent cellulitis area and he apparently received  penicillin at his primary physician's office really worked in my view but usually response to doxycycline given it to him several times in the past 12/15/17; the patient had already deteriorated last Friday when he came in for his nurse check. There was swelling erythema and breakdown in the right leg. He has much worse skin breakdown in the left leg as well multiple open areas  medially and posteriorly as well as laterally. He tells me he has been using his compression pumps but tells me he feels that the drainage out of his leg is worse when he uses a compression pumps. To be fair to him he is been saying this for a while however I don't know that I have really been listening to this. I wonder if the compression pumps are working properly 12/22/17;. Once again he arrives with severe erythema, weeping edema from the left greater than right leg. Noncompliance with compression pumps. New this visit he is complaining of pain on the lateral aspect of the right leg and the medial aspect of his right thigh. He apparently saw his cardiologist Dr. [name] who was ordered an echocardiogram area and I think this is a step in the right direction 12/25/17; started his doxycycline Monday night. There is still intense erythema of the right leg especially in the anterior thigh although there is less tenderness. The erythema around the wound on the right lateral calf also is less tender. He still complaining of pain in the left heel. His wounds are about the same right lateral left medial left lateral. Superficial but certainly not close to closure. He denies being systemically unwell no fever chills no abdominal pain no diarrhea 12/29/17; back in follow-up of his extensive right calf and right thigh cellulitis. I added amoxicillin to cover possible doxycycline resistant strep. This seems to of done the trick he is in much less pain there is much less erythema and swelling. He has his echocardiogram at 11:00 this morning. X-ray of the left heel was also negative. 01/05/18; the patient arrived with his edema under much better control. Now that he is retired he is able to use his compression pumps daily and sometimes twice a day per the patient. He has a wound on the right leg the lateral wound looks better. Area on the left leg also looks a lot better. He has no evidence of cellulitis in his  bilateral thighs I had a quick peak at his echocardiogram. He is in normal ejection fraction and normal left ventricular function. He has moderate pulmonary hypertension moderately reduced right ventricular function. One would have to wonder about chronic sleep apnea although he says he doesn't snore. He'll review the echocardiogram with his cardiologist. 01/12/18; the patient arrives with the edema in both legs under exemplary control. He is using his compression pumps daily and sometimes twice daily. His wound on the right lateral leg is just about closed. He still has some weeping areas on the posterior left calf and lateral left calf although everything is just about closed here as well. I have spoken with Aldean Baker who is the patient's nurse practitioner and infectious disease. She was concerned that the patient had not understood that the parenteral penicillin injections he was receiving for cellulitis prophylaxis was actually benefiting him. I don't think the patient actually saw that I would tend to agree we were certainly dealing with less infections although he had a serious one last month. 01/19/89-he is here in follow up evaluation for venous and lymphedema  ulcers. He is healed. He'll be placed in juxtalite compression wraps and increase his lymphedema pumps to twice daily. We will follow up again next week to ensure there are no issues with the new regiment. 01/20/18-he is here for evaluation of bilateral lower extremity weeping edema. Yesterday he was placed in compression wrap to the right lower extremity and compression stocking to left lower shrubbery. He states he uses lymphedema pumps last night and again this morning and noted a blister to the left lower extremity. On exam he was noted to have drainage to the right lower extremity. He will be placed in Unna boots bilaterally and follow-up next week 01/26/18; patient was actually discharged a week ago to his own juxta light  stockings only to return the next day with bilateral lower extremity weeping edema.he was placed in bilateral Unna boots. He arrives today with pain in the back of his left leg. There is no open area on the right leg however there is a linear/wrap injury on the left leg and weeping edema on the left leg posteriorly. I spoke with infectious disease about 10 days ago. They were disappointed that the patient elected to discontinue prophylactic intramuscular penicillin shots as they felt it was particularly beneficial in reducing the frequency of his cellulitis. I discussed this with the patient today. He does not share this view. He'll definitely need antibiotics today. Finally he is traveling to [place] and trauma leaving this Saturday and returning a week later and he does not travel with his pumps. He is going by car 01/30/18; patient was seen 4 days ago and brought back in today for review of cellulitis in the left leg posteriorly. I put him on amoxicillin this really hasn't helped as much as I might like. He is also worried because he is traveling to [facility] trauma by car. Finally we will be rewrapping him. There is no open area on the right leg over his left leg has multiple weeping areas as usual 02/09/18; The same wrap on for 10 days. He did not pick up the last doxycycline I prescribed for him. He apparently took 4 days worth he already had. There is nothing open on his right leg and the edema control is really quite good. He's had damage in the left leg medially and laterally especially probably related to the prolonged use of Unna boots 02/12/18; the patient arrived in clinic today for a nurse visit/wrap change. He complained of a lot of pain in the left posterior calf. He is taking doxycycline that I previously prescribed for him. Unfortunately even though he used his stockings and apparently used to compression pumps twice a day he has weeping edema coming out of the lateral part of his  right leg. This is coming from the lower anterior lateral skin area. 02/16/18; the patient has finished his doxycycline and will finish the amoxicillin 2 days. The area of cellulitis in the left calf posteriorly has resolved. He is no longer having any pain. He tells me he is using his compression pumps at least once a day sometimes twice. 02/23/18; the patient finished his doxycycline and Amoxil last week. On Friday he noticed a small erythematous circle about the size of a quarter on the left lower leg just above his ankle. This rapidly expanded and he now has erythema on the lateral and posterior part of the thigh. This is bright red. Also has an area on the dorsal foot just above his toes and a tender area just  below the left popliteal fossa. He came off his prophylactic penicillin injections at his own insistence one or 2 months ago. This is obviously deteriorated since then 03/02/18; patient is on doxycycline and Amoxil. Culture I did last week of the weeping area on the back of his left calf grew group B strep. I have therefore renewed the amoxicillin 500 3 times a day for a further week. He has not been systemically unwell. Still complaining of an area of discomfort right under his left popliteal fossa. There is no open wound on the right leg. He tells me that he is using his pumps twice a day on most days 03/09/18; patient arrives in clinic today completing his amoxicillin today. The cellulitis on his left leg is better. Furthermore he tells me that he had intramuscular penicillin shots that his primary care office today. However he also states that the wrap on his right leg fell down shortly after leaving clinic last week. He developed a large blister that was present when he came in for a nurse visit later in the week and then he developed intense discomfort around this area.He tells me he is using his compression pumps 03/16/18; the patient has completed his doxycycline. The infectious part  of this/cellulitis in the left heel area left popliteal area is a lot better. He has 2 open areas on the right calf. Still areas on the left calf but this is a lot better as well. 03/24/18; the patient arrives complaining of pain in the left popliteal area again. He thinks some of this is wrap injury. He has no open area on the right leg and really no open area on the left calf either except for the popliteal area. He claims to be compliant with the compression pumps 03/31/18; I gave him doxycycline last week because of cellulitis in the left popliteal area. This is a lot better although the surface epithelium is denuded off and response to this. He arrives today with uncontrolled edema in the right calf area as well as a fingernail injury in the right lateral calf. There is only a few open areas on the left 04/06/18; I gave him amoxicillin doxycycline over the last 2 weeks that the amoxicillin should be completing currently. He is not complaining of any pain or systemic symptoms. The only open areas see has is on the right lateral lower leg paradoxically I cannot see anything on the left lower leg. He tells me he is using his compression pumps twice a day on most days. Silver alginate to the wounds that are open under 4 layer compression 04/13/18; he completed antibiotics and has no new complaints. Using his compression pumps. Silver alginate that anything that's opened 04/20/18; he is using his compression pumps religiously. Silver alginate 4 layer compression anything that's opened. He comes in today with no open wounds on the left leg but 3 on the right including a new one posteriorly. He has 2 on the right lateral and one on the right posterior. He likes Unna boots on the right leg for reasons that aren't really clear we had the usual 4 layer compression on the left. It may be necessary to move to the 4 layer compression on the right however for now I left them in the Unna boots 04/27/18; he is  using his compression pumps at least once a day. He has still the wounds on the right lateral calf. The area right posteriorly has closed. He does not have an open wound on  the left under 4 layer compression however on the dorsal left foot just proximal to the toes and the left third toe 2 small open areas were identified 05/11/18; he has not uses compression pumps. The areas on the right lateral calf have coalesced into one large wound necrotic surface. On the left side he has one small wound anteriorly however the edema is now weeping out of a large part of his left leg. He says he wasn't using his pumps because of the weeping fluid. I explained to him that this is the time he needs to pump more 05/18/18; patient states he is using his compression pumps twice a day. The area on the right lateral large wound albeit superficial. On the left side he has innumerable number of small new wounds on the left calf particularly laterally but several anteriorly and medially. All these appear to have healthy granulated base these look like the remnants of blisters however they occurred under compression. The patient arrives in clinic today with his legs somewhat better. There is certainly less edema, less multiple open areas on the left calf and the right anterior leg looks somewhat better as well superficial and a little smaller. However he relates pain and erythema over the last 3-4 days in the thigh and I looked at this today. He has not been systemically unwell no fever no chills no change in blood sugar values 05/25/18; comes in today in a better state. The severe cellulitis on his left leg seems better with the Keflex. Not as tender. He has not been systemically unwell ooHard to find an open wound on the left lower leg using his compression pumps twice a day ooThe confluent wounds on his right lateral calf somewhat better looking. These will ultimately need debridement I didn't do this today. 06/01/18;  the severe cellulitis on the left anterior thigh has resolved and he is completed his Keflex. ooThere is no open wound on the left leg however there is a superficial excoriation at the base of the third toe dorsally. Skin on the bottom of his left foot is macerated looking. ooThe left the wounds on the lateral right leg actually looks some better although he did require debridement of the top half of this wound area with an open curet 06/09/18 on evaluation today patient appears to be doing poorly in regard to his right lower extremity in particular this appears to likely be infected he has very thick purulent discharge along with a bright green tent to the discharge. This makes me concerned about the possibility of pseudomonas. He's also having increased discomfort at this point on evaluation. Fortunately there does not appear to be any evidence of infection spreading to the other location at this time. 06/16/18 on evaluation today patient appears to actually be doing fairly well. His ulcer has actually diminished in size quite significantly at this point which is good news. Nonetheless he still does have some evidence of infection he did see infectious disease this morning before coming here for his appointment. I did review the results of their evaluation and their note today. They did actually have him discontinue the Cipro and initiate treatment with linezolid at this time. He is doing this for the next seven days and they recommended a follow-up in four months with them. He is the keep a log of the need for intermittent antibiotic therapy between now and when he falls back up with infectious disease. This will help them gaze what exactly they need to  do to try and help them out. 06/23/18; the patient arrives today with no open wounds on the left leg and left third toe healed. He is been using his compression pumps twice a day. On the right lateral leg he still has a sizable wound but this is a  lot better than last time I saw this. 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 Has been using silver alginate under 4 layer compression 06/30/18; the only open wound he has is on the right lateral leg and this looks healthy. No debridement is required. We have been using silver alginate. He does not have an open wound on the left leg. There is apparently some drainage from the dorsal proximal third toe on the left although I see no open wound here. 07/03/18 on evaluation today patient was actually here just for a nurse visit rapid change. However when he was here on Wednesday for his rat change due to having been healed on the left and then developing blisters we initiated the wrap again knowing that he would be back today for [place] to reevaluate and see were at. Unfortunately he has developed some cellulitis into the proximal portion of his right lower extremity even into the region of his thigh. 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 He was placed on one as what at that point. Nonetheless he is done with that and has been tolerating it well otherwise. Doxycycline which in the past really did not seem to be effective for him. Nonetheless I think the best option may be for [place] to definitely reinitiate the antibiotics for a longer period of time. 07/07/18; since I last saw this patient a week ago he has had a difficult time. At that point he did not have an open wound on his left leg. We transitioned him into juxta light stockings. He was apparently in the clinic the next day with blisters on the left lateral and left medial lower calf. He also had weeping edema fluid. He was put back into a compression wrap. He was also in the clinic on Friday with intense erythema in his right thigh. Per the patient he was started on Bactrim however that didn't work at  all in terms of relieving his pain and swelling. He has taken 3 doxycycline that he had left over from last time and that seems to of helped. He has blistering on the right thigh as well. 07/14/18; the erythema on his right thigh has gotten better with doxycycline that he is finishing. 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 Presumably this cellulitis in the thigh was not related to that although I think this is a bit concerning going forward. He still has an area on the right lateral calf the blister on the right medial calf just below the knee that was discussed above. On the left 2 small open areas left medial and left lateral. Edema control is adequate. He is using his compression pumps twice a day 07/20/18; continued improvement in the condition of both legs especially the edema in his bilateral thighs. He tells me he is been losing weight through a combination of diet and exercise. He is using his compression pumps twice a day. So overall she made to the remaining wounds  07/27/2018; continued improvement in condition of both legs. His edema is well controlled. The area on the right lateral leg is just about closed he had one blisters show up on the medial left upper calf. We have him in 4 layer compression. He is going on a 10-day trip to [place], [place] and [place]. He will be driving. He wants to wear Unna boots because of the lessening amount of constriction. He will not use compression pumps while he is away 08/05/18 on evaluation today patient actually appears to be doing decently well all things considered in regard to his bilateral lower extremities. The worst ulcer is actually only posterior aspect of his left lower extremity with a four layer compression wrap cut into his leg a couple weeks back. He did have a trip and actually had [employer] for the trip that he is worn since he was last here.  Nonetheless he feels like the [employer] actually do better for him his swelling is up a little bit but he also with his trip was not taking his Lasix on a regular set schedule like he was supposed to be. He states that obviously the reason being that he cannot drive and keep going without having to urinate too frequently which makes it difficult. He did not have his pumps with him while he was away either which I think also maybe playing a role here too. 08/13/2018; the patient only has a small open wound on the right lateral calf which is a big improvement in the last month or 2. He also has the area posteriorly just below the posterior fossa on the left which I think was a wrap injury from several weeks ago. He has no current evidence of cellulitis. He tells me he is back into his compression pumps twice a day. He also tells me that while he was at the laundromat somebody stole a section of his extremitease stockings 08/20/2018; back in the clinic with a much improved state. He only has small areas on the right lateral mid calf which is just about healed. This was is more substantial area for quite a prolonged period of time. He has a small open area on the left anterior tibia. The area on the posterior calf just below the popliteal fossa is closed today. He is using his compression pumps twice a day 08/28/2018; patient has no open wound on the right leg. He has a smattering of open areas on the calf with some weeping lymphedema. More problematically than that it looks as though his wraps of slipped down in his usual he has very angry upper area of edema just below the right medial knee and on the right lateral calf. He has no open area on his feet. The patient is traveling to [facility] next week. I will send him in an antibiotic. We will continue to wrap the right leg. We ordered extremitease stockings for him last week and I plan to transition the right leg to a stocking when he gets  home which will be in 10 days time. As usual he is very reluctant to take his pumps with him when he travels 09/07/2018; patient returns from [facility]. He shows me a picture of his left leg in the mid part of his trip last week with intense fire engine erythema. The picture look bad enough I would have considered sending him to the hospital. Instead he went to the wound care center in [facility].  They did not prescribe him antibiotics but he did take some doxycycline he had leftover from a previous visit. I had given him trimethoprim sulfamethoxazole before he left this did not work according to the patient. This is resulted in some improvement fortunately. He comes back with a large wound on the left posterior calf. Smaller area on the left anterior tibia. Denuded blisters on the dorsal left foot over his toes. Does not have much in the way of wounds on the right leg although he does have a very tender area on the right posterior area just below the popliteal fossa also suggestive of infection. He promises me he is back on his pumps twice a day 09/15/2018; the intense cellulitis in his left lower calf is a lot better. The wound area on the posterior left calf is also so better. However he has reasonably extensive wounds on the dorsal aspect of his second and third toes and the proximal foot just at the base of the toes. There is nothing open on the right leg 09/22/2018; the patient has excellent edema control in his legs bilaterally. He is using his external compression pumps twice a day. He has no open area on the right leg and only the areas in the left foot dorsally second and third toe area on the left side. He does not have any signs of active cellulitis. 10/06/2018; the patient has good edema control bilaterally. He has no open wound on the right leg. There is a blister in the posterior aspect of his left calf that we had to deal with today. He is using his compression pumps  twice a day. There is no signs of active cellulitis. We have been using silver alginate to the wound areas. He still has vulnerable areas on the base of his left first second toes dorsally He has a his extremities stockings and we are going to transition him today into the stocking on the right leg. He is cautioned that he will need to continue to use the compression pumps twice a day. If he notices uncontrolled edema in the right leg he may need to go to 3 times a day. 10/13/2018; the patient came in for a nurse check on Friday he has a large flaccid blister on the right medial calf just below the knee. We unroofed this. He has this and a new area underneath the posterior mid calf which was undoubtedly a blister as well. He also has several small areas on the right which is the area we put his extremities stocking on. 10/19/2018; the patient went to see infectious disease this morning I am not sure if that was a routine follow-up in any case the doxycycline I had given him was discontinued and started on linezolid. He has not started this. It is easy to look at his left calf and the inflammation and think this is cellulitis however he is very tender in the tissue just below the popliteal fossa and I have no doubt that there is infection going on here. He states the problem he is having is that with the compression pumps the edema goes down and then starts walking the wrap falls down. We will see if we can adhere this. He has 1 or 2 minuscule open areas on the right still areas that are weeping on the posterior left calf, the base of his left second and third toes 10/26/18; back today in clinic with quite of skin breakdown in his left anterior leg. This may  have been infection the area below the popliteal fossa seems a lot better however tremendous epithelial loss on the left anterior mid tibia area over quite inexpensive tissue. He has 2 blisters on the right side but no other open wound  here. 10/29/2018; came in urgently to see [place] today and we worked him in for review. He states that the 4 layer compression on the right leg caused pain he had to cut it down to roughly his mid calf this caused swelling above the wrap and he has blisters and skin breakdown today. As a result of the pain he has not been using his pumps. Both legs are a lot more edematous and there is a lot of weeping fluid. 11/02/18; arrives in clinic with continued difficulties in the right leg> left. Leg is swollen and painful. multiple skin blisters and new open areas especially laterally. He has not been using his pumps on the right leg. 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 He is not systemically unwell. 11/09/2018; the patient claims he is being compliant with his pumps. He is finished the doxycycline I gave him last week. Culture I did of the wound on the right lateral leg showed a few very resistant methicillin staph aureus. This was resistant to doxycycline. Nevertheless he states the pain in the leg is a lot better which makes me wonder if the cultured organism was not really what was causing the problem nevertheless this is a very dangerous organism to be culturing out of any wound. His right leg is still a lot larger than the left. He is using an Radio broadcast assistant on this area, he blames a 4-layer compression for causing the original skin breakdown which I doubt is true however I cannot talk him out of it. We have been using silver alginate to all of these areas which were initially blisters 11/16/2018; patient is being compliant with his external compression pumps at twice a day. Miraculously he arrives in clinic today with absolutely no open wounds. He has better edema control on the left where he has been using 4 layer compression versus wound of wounds on the right and I pointed this out to him. There is no inflammation in the skin in his lower legs which is  also somewhat unusual for him. There is no open wounds on the dorsal left foot. He has extremitease stockings at home and I have asked him to bring these in next week. 11/25/18 patient's lower extremity on examination today on the left appears for the most part to be wound free. He does have an open wound on the lateral aspect of the right lower extremity but this is minimal compared to what I've seen in past. He does request that we go ahead and wrap the left leg as well even though there's nothing open just so hopefully it will not reopen in short order. 1/28; patient has superficial open wounds on the right lateral calf left anterior calf and left posterior calf. His edema control is adequate. 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 We have been using silver alginate as the primary dressing. He claims compliance with his compression pumps 2/4; patient has superficial open wounds on numerous areas of his left calf and again one on the left dorsal foot. The areas on the right lateral calf have healed. The cellulitis that I gave him doxycycline  for last week is also resolved this was mostly on the left anterior calf just below the tibial tuberosity. His edema looks fairly well-controlled. He tells me he went to see his primary doctor today and had blood work ordered 2/11; once again he has several open areas on the left calf left tibial area. Most of these are small and appear to have healthy granulation. He does not have anything open on the right. The edema and control in his thighs is pretty good which is usually a good indication he has been using his pumps as requested. 2/18; he continues to have several small areas on the left calf and left tibial area. Most of these are small healthy granulation. We put him in his stocking on the right leg last week and he arrives with a superficial open area over the  right upper tibia and a fairly large area on the right lateral tibia in similar condition. His edema control actually does not look too bad, he claims to be using his compression pumps twice a day 2/25. Continued small areas on the left calf and left tibial area. New areas especially on the right are identified just below the tibial tuberosity and on the right upper tibia itself. There are also areas of weeping edema fluid even without an obvious wound. He does not have a considerable degree of lymphedema but clearly there is more edema here than his skin can handle. He states he is using the pumps twice a day. We have an Unna boot on the right and 4 layer compression on the left. 3/3; he continues to have an area on the right lateral calf and right posterior calf just below the popliteal fossa. There is a fair amount of tenderness around the wound on the popliteal fossa but I did not see any evidence of cellulitis, could just be that the wrap came down and rubbed in this area. ooHe does not have an open area on the left leg however there is an area on the left dorsal foot at the base of the third toe ooWe have been using silver alginate to all wound areas 3/10; he did not have an open area on his left leg last time he was here a week ago. Today he arrives with a horizontal wound just below the tibial tuberosity and an area on the left lateral calf. He has intense erythema and tenderness in this area. The area is on the right lateral calf and right posterior calf better than last week. We have been using silver alginate as usual 3/18 - Patient returns with 3 small open areas on left calf, and 1 small open area on right calf, the skin looks ok with no significant erythema, he continues the UNA boot on right and 4 layer compression on left. The right lateral calf wound is closed , the right posterior is small area. we will continue silver alginate to the areas. Culture results from right  posterior calf wound is + MRSA sensitive to Bactrim but resistant to DOXY 01/27/19 on evaluation today patient's bilateral lower extremities actually appear to be doing fairly well at this point which is good news. He is been tolerating the dressing changes without complication. Fortunately she has made excellent improvement in regard to the overall status of his wounds. Unfortunately every time we cease wrapping him he ends up reopening in causing more significant issues at that point. Again I'm unsure of the best direction to take although I think the lymphedema  clinic may be appropriate for him. 02/03/19 on evaluation today patient appears to be doing well in regard to the wounds that we saw him for last week unfortunately he has a new area on the proximal portion of his right medial/posterior lower extremity where the wrap somewhat slowed down and caused swelling and a blister to rub and open. Unfortunately this is the only opening that he has on either leg at this point. 02/17/19 on evaluation today patient's bilateral lower extremities appear to be doing well. He still completely healed in regard to the left lower extremity. In regard to the right lower extremity the area where the wrap and slid down and caused the blister still seems to be slightly open although this is dramatically better than during the last evaluation two weeks ago. I'm very pleased with the way this stands overall. 03/03/19 on evaluation today patient appears to be doing well in regard to his right lower extremity in general although he did have a new blister open this does not appear to be showing any evidence of active infection at this time. Fortunately there's No fevers, chills, nausea, or vomiting noted at this time. Overall I feel like he is making good progress it does feel like that the right leg will we perform the [employer] seems to do with a bit better than three layer wrap on the left which slid down on him. We  may switch to doing bilateral in the book wraps. 5/4; I have not seen Mr. Pelphrey in quite some time. According to our case manager he did not have an open wound on his left leg last week. He had 1 remaining wound on the right posterior medial calf. He arrives today with multiple openings on the left leg probably were blisters and/or wrap injuries from Unna boots. I do not think the Unna boot's will provide adequate compression on the left. I am also not clear about the frequency he is using the compression pumps. 03/17/19 on evaluation today patient appears to be doing excellent in regard to his lower extremities compared to last week's evaluation apparently. He had gotten significantly worse last week which is unfortunate. The [employer] wrap on the left did not seem to do very well for him at all and in fact it didn't control his swelling significantly enough he had an additional outbreak. Subsequently we go back to the four layer compression wrap on the left. This is good news. At least in that he is doing better and the wound seem to be killing him. He still has not heard anything from the lymphedema clinic. 03/24/19 on evaluation today patient actually appears to be doing much better in regard to his bilateral lower Trinity as compared to last week when I saw him. Fortunately there's no signs of active infection at this time. He has been tolerating the dressing changes without complication. Overall I'm extremely pleased with the progress and appearance in general. 04/07/19 on evaluation today patient appears to be doing well in regard to his bilateral lower extremities. His swelling is significantly down from where it was previous. With that being said he does have a couple blisters still open at this point but fortunately nothing that seems to be too severe and again the majority of the larger openings has healed at this time. 04/14/19 on evaluation today patient actually appears to be doing  quite well in regard to his bilateral lower extremities in fact I'm not even sure there's anything significantly open  at this time at any site. Nonetheless he did have some trouble with these wraps where they are somewhat irritating him secondary to the fact that he has noted that the graph wasn't too close down to the end of this foot in a little bit short as well up to his knee. Otherwise things seem to be doing quite well. 04/21/19 upon evaluation today patient's wound bed actually showed evidence of being completely healed in regard to both lower extremities which is excellent news. There does not appear to be any signs of active infection which is also good news. I'm very pleased in this regard. No fevers, chills, nausea, or vomiting noted at this time. 04/28/19 on evaluation today patient appears to be doing a little bit worse in regard to both lower extremities on the left mainly due to the fact that when he went infection disease the wrap was not wrapped quite high enough he developed a blister above this. On the right he is a small open area of nothing too significant but again this is continuing to give him some trouble he has been were in the Velcro compression that he has at home. 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 He's been tolerating the dressing changes without complication. Fortunately there's no signs of active infection at this time. No fevers, chills, nausea, or vomiting noted at this time. We have been trying to get an appointment with her lymphedema clinic in [facility] but unfortunately nobody can get them on phone with not been able to even fax information over the patient likewise is not been able to get in touch with them. Overall I'm not sure exactly what's going on here with to reach out again today. 05/12/19 on evaluation today patient actually appears to be doing about the same in regard  to his bilateral lower Trinity ulcers. Still having a lot of drainage unfortunately. He tells me especially in the left but even on the right. There's no signs of active infection which is good news we've been using so ratcheted up to this point. 05/19/19 on evaluation today patient actually appears to be doing quite well with regard to his left lower extremity which is great news. Fortunately in regard to the right lower extremity has an issues with his wrap and he subsequently did remove this from what I'm understanding. Nonetheless long story short is what he had rewrapped once he removed it subsequently had maggots underneath this wrap whenever he came in for evaluation today. With that being said they were obviously completely cleaned away by the nursing staff. The visit today which is excellent news. However he does appear to potentially have some infection around the right ankle region where the maggots were located as well. He will likely require anabiotic therapy today. 05/26/19 on evaluation today patient actually appears to be doing much better in regard to his bilateral lower extremities. I feel like the infection is under much better control. With that being said there were maggots noted when the wrap was removed yet again today. Again this could have potentially been left over from previous although at this time there does not appear to be any signs of significant drainage there was obviously on the wrap some drainage as well this contracted gnats or otherwise. Either way I do not see anything that appears to be doing worse in my pinion and in fact I think his drainage has slowed down quite  significantly likely mainly due to the fact to his infection being under better control. 06/02/2019 on evaluation today patient actually appears to be doing well with regard to his bilateral lower extremities there is no signs of active infection at this time which is great news. With that being said  he does have several open areas more so on the right than the left but nonetheless these are all significantly better than previously noted. 06/09/2019 on evaluation today patient actually appears to be doing well. His wrap stayed up and he did not cause any problems he had more drainage on the right compared to the left but overall I do not see any major issues at this time which is great news. 06/16/2019 on evaluation today patient appears to be doing excellent with regard to his lower extremities the only area that is open is a new blister that can have opened as of today on the medial ankle on the left. Other than this he really seems to be doing great I see no major issues at this point. 06/23/2019 on evaluation today patient appears to be doing quite well with regard to his bilateral lower extremities. In fact he actually appears to be almost completely healed there is a small area of weeping noted of the right lower extremity just above the ankle. Nonetheless fortunately there is no signs of active infection at this time which is good news. No fevers, chills, nausea, vomiting, or diarrhea. 8/24; the patient arrived for a nurse visit today but complained of very significant pain in the left leg and therefore I was asked to look at this. Noted that he did not have an open area on the left leg last week nevertheless this was wrapped. The patient states that he is not been able to put his compression pumps on the left leg because of the discomfort. He has not been systemically unwell 06/30/2019 on evaluation today patient unfortunately despite being excellent last week is doing much worse with regard to his left lower extremity today. In fact he had to come in for a nurse on Monday where his left leg had to be rewrapped due to excessive weeping Dr. Leanord Hawking placed him on doxycycline at that point. Fortunately there is no signs of active infection Systemically at this time which is good news. 07/07/2019  in regard to the patient's wounds today he actually seems to be doing well with his right lower extremity there really is nothing open or draining at this point this is great news. Unfortunately the left lower extremity is given him additional trouble at this time. There does not appear to be any signs of active infection nonetheless he does have a lot of edema and swelling noted at this point as well as blistering all of which has led to a much more poor appearing leg at this time compared to where it was 2 weeks ago when it was almost completely healed. Obviously this is a little discouraging for the patient. He is try to contact the lymphedema clinic in [place] he has not been able to get through to them. 07/14/2019 on evaluation today patient actually appears to be doing slightly better with regard to his left lower extremity ulcers. Overall I do feel like at least at the top of the wrap that we have been placing this area has healed quite nicely and looks much better. The remainder of the leg is showing signs of improvement. Unfortunately in the thigh area he still has an open  region on the left and again on the right he has been utilizing just a Band-Aid on an area that also opened on the thigh. Again this is an area that were not able to wrap although we did do an Ace wrap to provide some compression that something that obviously is a little less effective than the compression wraps we have been using on the lower portion of the leg. He does have an appointment with the lymphedema clinic in [facility] on Friday. 07/21/2019 on evaluation today patient appears to be doing better with regard to his lower extremity ulcers. He has been tolerating the dressing changes without complication. Fortunately there is no signs of active infection at this time. No fevers, chills, nausea, vomiting, or diarrhea. I did receive the paperwork from the physical therapist at the lymphedema clinic in  [place]. Subsequently I signed off on that this morning and sent that back to him for further progression with the treatment plan. 07/28/2019 on evaluation today patient appears to be doing very well with regard to his right lower extremity where I do not see any open wounds at this point. Fortunately he is feeling great as far as that is concerned as well. In regard to the left lower extremity he has been having issues with still several areas of weeping and edema although the upper leg is doing better his lower leg still I think is going require the compression wrap at this time. No fevers, chills, nausea, vomiting, or diarrhea. 08/04/2019 on evaluation today patient unfortunately is having new wounds on the right lower extremity. Again we have been using Unna boot wrap on that side. We switched him to using his juxta lite wrap at home. With that being said he tells me he has been using it although his legs extremely swollen and to be honest really does not appear that he has been. I cannot know that for sure however. Nonetheless he has multiple new wounds on the right lower extremity at this time. Obviously we will have to see about getting this rewrapped for him today. 08/11/2019 on evaluation today patient appears to be doing fairly well with regard to his wounds. He has been tolerating the dressing changes including the compression wraps without complication. He still has a lot of edema in his upper thigh regions bilaterally he is supposed to be seeing the lymphedema clinic on the 15th of this month once his wraps arrive for the upper part of his legs. 08/18/2019 on evaluation today patient appears to be doing well with regard to his bilateral lower extremities at this point. He has been tolerating the dressing changes without complication. Fortunately there is no signs of active infection which is also good news. He does have a couple weeping areas on the first and second toe of the right  foot he also has just a small area on the left foot upper leg and a small area on the left lower leg but overall he is doing quite well in my opinion. He is supposed to be getting his wraps shortly in fact tomorrow and then subsequently is seeing the lymphedema clinic next Wednesday on the 21st. Of note he is also leaving on the 25th to go on vacation for a week to the beach. For that reason and since there is some uncertainty about what there can be doing at lymphedema clinic next Wednesday I am get a make an appointment for next Friday here for [place] to see what we need to do  for him prior to him leaving for vacation. 10/23; patient arrives in considerable pain predominantly in the upper posterior calf just distal to the popliteal fossa also in the wound anteriorly above the major wound. This is probably cellulitis and he has had this recurrently in the past. He has no open wound on the right side and he has had an Radio broadcast assistant in that area. Finally I note that he has an area on the left posterior calf which by enlarge is mostly epithelialized. This protrudes beyond the borders of the surrounding skin in the setting of dry scaly skin and lymphedema. The patient is leaving for [facility] on Sunday. Per his longstanding pattern, he will not take his compression pumps with him predominantly out of fear that they will be stolen. He therefore asked that we put a Unna boot back on the right leg. He will also contact the wound care center in [facility] to see if they can change his dressing in the mid week. 11/3; patient returned from his vacation to [facility]. He was seen on 1 occasion at their wound care center. They did a 2 layer compression system as they did not have our 4-layer wrap. I am not completely certain what they put on the wounds. They did not change the Unna boot on the right. The patient is also seeing a lymphedema specialist physical therapist in [place]. It appears that he  has some compression sleeve for his thighs which indeed look quite a bit better than I am used to seeing. He pumps over these with his external compression pumps. 11/10; the patient has a new wound on the right medial thigh otherwise there is no open areas on the right. He has an area on the left leg posteriorly anteriorly and medially and an area over the left second toe. We have been using silver alginate. He thinks the injury on his thigh is secondary to friction from the compression sleeve he has. 11/17; the patient has a new wound on the right medial thigh last week. He thinks this is because he did not have a underlying stocking for his thigh juxta lite apparatus. He now has this. The area is fairly large and somewhat angry but I do not think he has underlying cellulitis. ooHe has a intact blister on the right anterior tibial area. ooSmall wound on the right great toe dorsally ooSmall area on the medial left calf. 11/30; the patient does not have any open areas on his right leg and we did not take his juxta lite stocking off. However he states that on Friday his compression wrap fell down lodging around his upper mid calf area. As usual this creates a lot of problems for him. He called urgently today to be seen for a nurse visit however the nurse visit turned into a provider visit because of extreme erythema and pain in the left anterior tibia extending laterally and posteriorly. The area that is problematic is extensive 10/06/2019 upon evaluation today patient actually appears to be doing poorly in regard to his left lower extremity. He Dr. Leanord Hawking did place him on doxycycline this past Monday apparently due to the fact that he was doing much worse in regard to this left leg. Fortunately the doxycycline does seem to be helping. Unfortunately we are still having a very difficult time getting his edema under any type of control in order to anticipate discharge at some point. The only way  were really able to control his lymphedema  really is with compression wraps and that has only even seemingly temporary. He has been seeing a lymphedema clinic they are trying to help in this regard but still this has been somewhat frustrating in general for the patient. 10/13/19 on evaluation today patient appears to be doing excellent with regard to his right lower extremity as far as the wounds are concerned. His swelling is still quite extensive unfortunately. He is still having a lot of drainage from the thigh areas bilaterally which is unfortunate. He's been going to lymphedema clinic but again he still really does not have this edema under control as far as his lower extremities are concern. With regard to his left lower extremity this seems to be improving and I do believe the doxycycline has been of benefit for him. He is about to complete the doxycycline. 10/20/2019 on evaluation today patient appears to be doing poorly in regard to his bilateral lower extremities. More in the right thigh he has a lot of irritation at this site unfortunately. In regard to the left lower extremity the wrap was not quite as high it appears and does seem to have caused him some trouble as well. Fortunately there is no evidence of systemic infection though he does have some blue-green drainage which has me concerned for the possibility of Pseudomonas. He tells me he is previously taking Cipro without complications and he really does not care for Levaquin however due to some of the side effects he has. He is not allergic to any medications specifically antibiotics that were aware of. 10/27/2019 on evaluation today patient actually does appear to be for the most part doing better when compared to last week's evaluation. With that being said he still has multiple open wounds over the bilateral lower extremities. He actually forgot to start taking the Cipro and states that he still has the whole bottle. He does  have several new blisters on left lower extremity today I think I would recommend he go ahead and take the Cipro based on what I am seeing at this point. 12/30-Patient comes at 1 week visit, 4 layer compression wraps on the left and Unna boot on the right, primary dressing Xtrasorb and silver alginate. Patient is taking his Cipro and has a few more days left probably 5-6, and the legs are doing better. He states he is using his compressions devices which I believe he has 11/10/2019 on evaluation today patient actually appears to be much better than last time I saw him 2 weeks ago. His wounds are significantly improved and overall I am very pleased in this regard. Fortunately there is no signs of active infection at this time. He is just a couple of days away from completing Cipro. Overall his edema is much better he has been using his lymphedema pumps which I think is also helping at this point. 11/17/2019 on evaluation today patient appears to be doing excellent in regard to his wounds in general. His legs are swollen but not nearly as much as they have been in the past. Fortunately he is tolerating the compression wraps without complication. No fevers, chills, nausea, vomiting, or diarrhea. He does have some erythema however in the distal portion of his right lower extremity specifically around the forefoot and toes there is a little bit of warmth here as well. 11/24/2019 on evaluation today patient appears to be doing well with regard to his right lower extremity I really do not see any open wounds at this point. His left  lower extremity does have several open areas and his right medial thigh also is open. Other than this however overall the patient seems to be making good progress and I am very pleased at this point. 12/01/2019 on evaluation today patient appears to be doing poorly at this point in regard to his left lower extremity has several new blisters despite the fact that we have him in  compression wraps. In fact he had a 4-layer compression wrap, his upper thigh wrapped from lymphedema clinic, and a juxta light over top of the 4 layer compression wrap the lymphedema clinic applied and despite all this he still develop blisters underneath. Obviously this does have me concerned about the fact that unfortunately despite what we are doing to try to get wounds healed he continues to have new areas arise I do not think he is ever good to be at the point where he can realistically just use wraps at home to keep things under control. Typically when we heal him it takes about 1-2 days before he is back in the clinic with severe breakdown and blistering of his lower extremities bilaterally. This is happened numerous times in the past. Unfortunately I think that we may need some help as far as overall fluid overload to kind of limit what we are seeing and get things under better control. 12/08/2019 on evaluation today patient presents for follow-up concerning his ongoing bilateral lower extremity edema. Unfortunately he is still having quite a bit of swelling the compression wraps are controlling this to some degree but he did see Dr. [name] his cardiologist I do have that available for review today as far as the appointment was concerned that was on 12/06/2019. Obviously that she has been 2 days ago. The patient states that he is only been taking the Lasix 80 mg 1 time a day he had told me previously he was taking this twice a day. Nonetheless Dr. [name] recommended this be up to 80 mg 2 times a day for the patient as he did appear to be fluid overloaded. With that being said the patient states he did this yesterday and he was unable to go anywhere or do anything due to the fact that he was constantly having to urinate. Nonetheless I think that this is still good to be something that is important for him as far as trying to get his edema under control at all things that he is going to be able to  just expect his wounds to get under control and things to be better without going through at least a period of time where he is trying to stabilize his fluid management in general and I think increasing the Lasix is likely the first step here. It was also mentioned the possibility that the patient may require metolazone. With that being said he wanted to have the patient take Lasix twice a day first and then reevaluating 2 months to see where things stand. 12/15/2019 upon evaluation today patient appears to be doing regard to his legs although his toes are showing some signs of weeping especially on the left at this point to some degree on the right. There does not appear to be any signs of active infection and overall I do feel like the compression wraps are doing well for him but he has not been able to take the Lasix at home and the increased dose that Dr. [name] recommended. He tells me that just not go to be feasible for him. Nonetheless  I think in this case he should probably send a message to Dr. [name] in order to discuss options from the standpoint of possible admission to get the fluid off or otherwise going forward. 12/22/2019 upon evaluation today patient appears to be doing fairly well with regard to his lower extremities at this point. In fact he would be doing excellent if it was not for the fact that his right anterior thigh apparently had an allergic reaction to adhesive tape that he used. The wound itself that we have been monitoring actually appears to be healed. There is a lot of irritation at this point. 12/29/2019 upon evaluation today patient appears to be doing well in regard to his lower extremities. His left medial thigh is open and somewhat draining today but this is the only region that is open the right has done much better with the treatment utilizing the steroid cream that I prescribed for him last week. Overall I am pleased in that regard. Fortunately there is no signs of  active infection at this time. No fevers, chills, nausea, vomiting, or diarrhea. 01/05/2020 upon evaluation today patient appears to be doing more poorly in regard to his right lower extremity at this point upon evaluation today. Unfortunately he continues to have issues in this regard and I think the biggest issue is controlling his edema. This obviously is not very well controlled at this point is been recommended that he use the Lasix twice a day but he has not been able to do that. Unfortunately I think this is leading to an issue where honestly he is not really able to effectively control his edema and therefore the wounds really are not doing significantly better. I do not think that he is going to be able to keep things under good control unless he is able to control his edema much better. I discussed this again in great detail with him today. 01/12/2020 good news is patient actually appears to be doing quite well today at this point. He does have an appointment with lymphedema clinic tomorrow. His legs appear healed and the toe on the left is almost completely healed. In general I am very pleased with how things stand at this point. 01/19/2020 upon evaluation today patient appears to actually be doing well in regard to his lower extremities there is nothing open at this point. Fortunately he has done extremely well more recently. Has been seeing lymphedema clinic as well. With that being said he has Velcro wraps for his lower legs as well as his upper legs. The only wound really is on his toe which is the right great toe and this is barely anything even there. With all that being said I think it is good to be appropriate today to go ahead and switch him over to the Velcro compression wraps. 01/26/2020 upon evaluation today patient appears to be doing worse with regard to his lower extremities after last week switch him to Velcro compression wraps. Unfortunately he lasted less than 24 hours he did  not have the sock portion of his Velcro wrap on the left leg and subsequently developed a blister underneath the Velcro portion. Obviously this is not good and not what we were looking for at this point. He states the lymphedema clinic did tell him to wear the wrap for 23 hours and take him off for 1 I am okay with that plan but again right now we got a get things back under control again he may have some  cellulitis noted as well. 02/02/2020 upon evaluation today patient unfortunately appears to have several areas of blistering on his bilateral lower extremities today mainly on the feet. His legs do seem to be doing somewhat better which is good news. Fortunately there is no evidence of active infection at this time. No fevers, chills, nausea, vomiting, or diarrhea. Objective Constitutional Obese and well-hydrated in no acute distress. Vitals Time Taken: 10:39 AM, Height: 70 in, Weight: 380.2 lbs, BMI: 54.5, Temperature: 97.7 F, Pulse: 69 bpm, Respiratory Rate: 18 breaths/min, Blood Pressure: 155/64 mmHg. Respiratory normal breathing without difficulty. Psychiatric this patient is able to make decisions and demonstrates good insight into disease process. Alert and Oriented x 3. pleasant and cooperative. General Notes: Patient's wounds currently again appear to have sealed up quite readily with regard to the openings that were noted last week. With that being said I think that he still does need to try as much as possible to elevate his legs, take the diuretics as directed, and subsequently if he does all of this I think he has a better chance of getting his legs under better control although I am not certain he is really good to be able to ever get out of the compression wraps entirely every time we do he seems to be in very short order back in a very bad state. Integumentary (Hair, Skin) Wound #161 status is Open. Original cause of wound was Gradually Appeared. The wound is located on the  Right Toe Great. The wound measures 0.1cm length x 0.1cm width x 0.1cm depth; 0.008cm^2 area and 0.001cm^3 volume. There is no tunneling or undermining noted. There is a medium amount of serosanguineous drainage noted. The wound margin is distinct with the outline attached to the wound base. There is large (67-100%) red, pink granulation within the wound bed. There is no necrotic tissue within the wound bed. Wound #162 status is Open. Original cause of wound was Gradually Appeared. The wound is located on the Right Toe Second. The wound measures 0.9cm length x 1cm width x 0.1cm depth; 0.707cm^2 area and 0.071cm^3 volume. There is no tunneling or undermining noted. There is a medium amount of serosanguineous drainage noted. The wound margin is distinct with the outline attached to the wound base. There is large (67-100%) pink granulation within the wound bed. There is no necrotic tissue within the wound bed. Wound #163 status is Open. Original cause of wound was Blister. The wound is located on the Left,Medial Lower Leg. The wound measures 0cm length x 0cm width x 0cm depth; 0cm^2 area and 0cm^3 volume. There is no tunneling or undermining noted. There is a none present amount of drainage noted. The wound margin is distinct with the outline attached to the wound base. There is no granulation within the wound bed. There is no necrotic tissue within the wound bed. Wound #164 status is Open. Original cause of wound was Blister. The wound is located on the Right,Medial Lower Leg. The wound measures 3cm length x 6.5cm width x 0.1cm depth; 15.315cm^2 area and 1.532cm^3 volume. There is Fat Layer (Subcutaneous Tissue) Exposed exposed. There is no tunneling or undermining noted. There is a medium amount of serosanguineous drainage noted. There is medium (34-66%) red granulation within the wound bed. There is a medium (34-66%) amount of necrotic tissue within the wound bed including Adherent Slough. Wound  #165 status is Open. Original cause of wound was Gradually Appeared. The wound is located on the Left Toe Great. The wound measures  0.8cm length x 0.7cm width x 0.1cm depth; 0.44cm^2 area and 0.044cm^3 volume. There is Fat Layer (Subcutaneous Tissue) Exposed exposed. There is no tunneling or undermining noted. There is a medium amount of serosanguineous drainage noted. There is medium (34-66%) pink granulation within the wound bed. There is a medium (34-66%) amount of necrotic tissue within the wound bed including Adherent Slough. Wound #166 status is Open. Original cause of wound was Gradually Appeared. The wound is located on the Left,Dorsal Foot. The wound measures 1cm length x 1cm width x 0.1cm depth; 0.785cm^2 area and 0.079cm^3 volume. There is Fat Layer (Subcutaneous Tissue) Exposed exposed. There is no tunneling or undermining noted. There is a medium amount of serosanguineous drainage noted. There is medium (34-66%) pink granulation within the wound bed. There is a medium (34-66%) amount of necrotic tissue within the wound bed including Adherent Slough. 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 There was a Four Layer Compression Therapy Procedure by Yevonne Pax, RN. Post procedure Diagnosis Wound #163: Same as Pre-Procedure Wound #164 Pre-procedure diagnosis of Wound #164 is a Lymphedema located on the Right,Medial Lower Leg . There was a Radio broadcast assistant Compression  Therapy Procedure by Yevonne Pax, RN. Post procedure Diagnosis Wound #164: Same as Pre-Procedure Plan Follow-up Appointments: Return Appointment in 1 week. Dressing Change Frequency: Do not change entire dressing for one week. - both legs Skin Barriers/Peri-Wound Care: Moisturizing lotion - to legs Wound Cleansing: Wound #161 Right Toe Great: May shower and wash wound with soap and water. Wound #162 Right Toe Second: May shower and wash wound with soap and water. Wound #163 Left,Medial Lower Leg: May shower with protection. Wound #164 Right,Medial Lower Leg: May shower with protection. Primary Wound Dressing: Wound #161 Right Toe Great: Calcium Alginate with Silver Wound #162 Right Toe Second: Calcium Alginate with Silver Wound #163 Left,Medial Lower Leg: Calcium Alginate with Silver Wound #164 Right,Medial Lower Leg: Calcium Alginate with Silver Wound #165 Left Toe Great: Calcium Alginate with Silver Wound #166 Left,Dorsal Foot: Calcium Alginate with Silver Secondary Dressing: Wound #161 Right Toe Great: Kerlix/Rolled Gauze Dry Gauze Wound #162 Right Toe Second: Kerlix/Rolled Gauze Dry Gauze Wound #163 Left,Medial Lower Leg: ABD pad - to left lower leg Zetuvit or Kerramax - to left lower leg Wound #164 Right,Medial Lower Leg: ABD pad Edema Control: 4 layer compression: Left lower extremity Unna Boot to Right Lower Extremity Avoid standing for long periods of time Elevate legs to the level of the heart or above for 30 minutes daily and/or when sitting, a frequency of: - whenever sitting Exercise regularly Segmental Compressive Device. - lymphadema pumps 60 minutes 2 times per day Additional Orders / Instructions: Other: - take lasix as prescribed by cardiologist 1 my suggestion at this time is good to be that we go ahead and initiate continuation of the wound care measures as before utilizing silver alginate to all open wound locations. 2. I am also going to  suggest at this point that the patient needs to continue with elevation, using his lymphedema pumps, and going to lymphedema clinic as  directed. We will see patient back for reevaluation in 1 week here in the clinic. If anything worsens or changes patient will contact our office for additional recommendations. Electronic Signature(s) Signed: 02/02/2020 1:18:46 PM By: Lenda Kelp PA-C Entered By: Lenda Kelp on 02/02/2020 13:18:45 -------------------------------------------------------------------------------- SuperBill Details Patient Name: Date of Service: [name], [name] 02/02/2020 Medical Record [id number] Patient Account Number: [id number] Date of Birth/Sex: Treating RN: 1951/06/28 (69 y.o. [name] Primary Care Provider: Nicoletta Ba Other Clinician: Referring Provider: Treating Provider/Extender:[name], [name], [name] in Treatment: 210 Diagnosis Coding ICD-10 Codes Code Description 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 skin L97.221 Non-pressure chronic ulcer of left calf limited to breakdown of skin 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 of bilateral lower extremity I89.0 Lymphedema, not elsewhere classified E11.622 Type 2 diabetes mellitus with other skin ulcer 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, unspecified L03.116 Cellulitis of left lower limb Facility Procedures CPT4 Code Description: [id number] (Facility Use Only) [phone] - APPLY Roland Rack BOOT RT Modifier: Quantity: 1 Physician Procedures CPT4: VHQI [id number] Description: 213 - WC PHYS LEVEL 3 - EST PT ICD-10 Diagnosis Description L97.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 right calf limited to brea L97.221 Non-pressure chronic ulcer of left calf limited to break I87.333 Chronic venous hypertension (idiopathic)  with ulcer and lower extremity I89.0 Lymphedema, not elsewhere classified Modifier:  kdown of skin down of skin inflammation of Quantity: 1 bilateral Electronic Signature(s) Signed: 02/02/2020 1:20:57 PM By: Lenda Kelp PA-C Entered By: Lenda Kelp on 02/02/2020 13:19:36

## 2020-02-03 DIAGNOSIS — I890 Lymphedema, not elsewhere classified: Secondary | ICD-10-CM | POA: Diagnosis not present

## 2020-02-03 DIAGNOSIS — R29898 Other symptoms and signs involving the musculoskeletal system: Secondary | ICD-10-CM | POA: Diagnosis not present

## 2020-02-08 DIAGNOSIS — Z7409 Other reduced mobility: Secondary | ICD-10-CM | POA: Diagnosis not present

## 2020-02-09 ENCOUNTER — Ambulatory Visit (INDEPENDENT_AMBULATORY_CARE_PROVIDER_SITE_OTHER): Payer: Medicare Other | Admitting: Family Medicine

## 2020-02-09 ENCOUNTER — Encounter (HOSPITAL_BASED_OUTPATIENT_CLINIC_OR_DEPARTMENT_OTHER): Payer: Medicare Other | Attending: Physician Assistant | Admitting: Physician Assistant

## 2020-02-09 ENCOUNTER — Ambulatory Visit: Payer: Medicare Other | Admitting: Physician Assistant

## 2020-02-09 ENCOUNTER — Encounter: Payer: Self-pay | Admitting: Physician Assistant

## 2020-02-09 ENCOUNTER — Other Ambulatory Visit: Payer: Self-pay

## 2020-02-09 VITALS — BP 148/70 | HR 53 | Temp 97.5°F | Ht 69.0 in | Wt 382.6 lb

## 2020-02-09 DIAGNOSIS — I5042 Chronic combined systolic (congestive) and diastolic (congestive) heart failure: Secondary | ICD-10-CM | POA: Diagnosis not present

## 2020-02-09 DIAGNOSIS — Z9119 Patient's noncompliance with other medical treatment and regimen: Secondary | ICD-10-CM | POA: Insufficient documentation

## 2020-02-09 DIAGNOSIS — F419 Anxiety disorder, unspecified: Secondary | ICD-10-CM | POA: Insufficient documentation

## 2020-02-09 DIAGNOSIS — E1140 Type 2 diabetes mellitus with diabetic neuropathy, unspecified: Secondary | ICD-10-CM | POA: Diagnosis not present

## 2020-02-09 DIAGNOSIS — L97221 Non-pressure chronic ulcer of left calf limited to breakdown of skin: Secondary | ICD-10-CM | POA: Insufficient documentation

## 2020-02-09 DIAGNOSIS — I872 Venous insufficiency (chronic) (peripheral): Secondary | ICD-10-CM | POA: Insufficient documentation

## 2020-02-09 DIAGNOSIS — L03116 Cellulitis of left lower limb: Secondary | ICD-10-CM | POA: Diagnosis not present

## 2020-02-09 DIAGNOSIS — I890 Lymphedema, not elsewhere classified: Secondary | ICD-10-CM | POA: Diagnosis not present

## 2020-02-09 DIAGNOSIS — I482 Chronic atrial fibrillation, unspecified: Secondary | ICD-10-CM | POA: Diagnosis not present

## 2020-02-09 DIAGNOSIS — I87333 Chronic venous hypertension (idiopathic) with ulcer and inflammation of bilateral lower extremity: Secondary | ICD-10-CM | POA: Insufficient documentation

## 2020-02-09 DIAGNOSIS — M109 Gout, unspecified: Secondary | ICD-10-CM | POA: Diagnosis not present

## 2020-02-09 DIAGNOSIS — Z794 Long term (current) use of insulin: Secondary | ICD-10-CM | POA: Diagnosis not present

## 2020-02-09 DIAGNOSIS — I10 Essential (primary) hypertension: Secondary | ICD-10-CM

## 2020-02-09 DIAGNOSIS — Z79899 Other long term (current) drug therapy: Secondary | ICD-10-CM | POA: Diagnosis not present

## 2020-02-09 DIAGNOSIS — E11621 Type 2 diabetes mellitus with foot ulcer: Secondary | ICD-10-CM | POA: Insufficient documentation

## 2020-02-09 DIAGNOSIS — E119 Type 2 diabetes mellitus without complications: Secondary | ICD-10-CM

## 2020-02-09 DIAGNOSIS — E11622 Type 2 diabetes mellitus with other skin ulcer: Secondary | ICD-10-CM | POA: Insufficient documentation

## 2020-02-09 DIAGNOSIS — L97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 right calf limited to breakdown of skin: Secondary | ICD-10-CM | POA: Insufficient documentation

## 2020-02-09 DIAGNOSIS — I272 Pulmonary hypertension, unspecified: Secondary | ICD-10-CM | POA: Diagnosis not present

## 2020-02-09 DIAGNOSIS — N183 Chronic kidney disease, stage 3 unspecified: Secondary | ICD-10-CM

## 2020-02-09 DIAGNOSIS — L03119 Cellulitis of unspecified part of limb: Secondary | ICD-10-CM | POA: Diagnosis not present

## 2020-02-09 DIAGNOSIS — L97521 Non-pressure chronic ulcer of other 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 of skin: Secondary | ICD-10-CM | POA: Diagnosis not present

## 2020-02-09 DIAGNOSIS — L97511 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 of skin: Secondary | ICD-10-CM | POA: Insufficient documentation

## 2020-02-09 NOTE — Progress Notes (Signed)
CAUY, MELODY ([id number]) Visit Report for 02/09/2020 SuperBill Details Patient Name: Date of Service: TRAVANTI, MCMANUS 02/09/2020 Medical Record [id number] Patient Account Number: [id number] Date of Birth/Sex: Treating RN: May 18, 1951 (69 y.o. [name] Primary Care Provider: Nicoletta Ba Other Clinician: Referring Provider: Treating Provider/Extender:[name], [name], [name] in Treatment: 211 Diagnosis Coding ICD-10 Codes Code Description 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 skin L97.221 Non-pressure chronic ulcer of left calf limited to breakdown of skin 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 of bilateral lower extremity I89.0 Lymphedema, not elsewhere classified E11.622 Type 2 diabetes mellitus with other skin ulcer 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, unspecified L03.116 Cellulitis of left lower limb Facility Procedures CPT4 Code Description Modifier Quantity [id number] (Facility Use Only) [phone] - APPLY MULTLAY COMPRS LWR LT LEG 1 [id number] (Facility Use Only) [id number] - APPLY UNNA BOOT RT 59 1 Electronic Signature(s) Signed: 02/09/2020 5:46:37 PM By: Shawn Stall Signed: 02/09/2020 7:02:55 PM By: Lenda Kelp PA-C Entered By: Shawn Stall on 02/09/2020 11:37:12

## 2020-02-09 NOTE — Progress Notes (Signed)
Cardiology Office Note:    Date:  02/11/2020   ID:  [name], DOB Apr 02, 1951, MRN [id number]  PCP:  [name], MD  Cardiologist:  [name], MD  Electrophysiologist:  None   Referring MD: [name], MD   Chief Complaint  Patient presents with  . Follow-up    History of Present Illness:    [name] is a 69 y.o. male with a hx of chronic atrial fibrillation, chronic combined systolic and diastolic heart failure, CKD stage III, DM 2, HLD, HTN, morbid obesity, OSA, and RLS.  Previous Myoview obtained on 10/05/2014 showed EF 42%, low risk perfusion study with diaphragmatic attenuation, elevated TID.  Echocardiogram obtained around the same time showed EF 50 to 55%, grade 2 DD, no regional wall motion abnormality.  Last echocardiogram obtained on 12/29/2017 showed EF 60 to 65%, mild RV dysfunction, PA peak pressure 53 mmHg.  Patient was last seen by Dr. [name] on 12/06/2019, at which time he has gained roughly 10 pounds.  He also had significant swelling as well.  Apparently he was only taking 80 mg daily of Lasix instead of 80 mg twice a day as instructed.  He was instructed to take it twice a day.   Patient presents today for cardiology office visit.  Instead of 80 mg twice daily of Lasix, he is actually taking the Lasix 80 mg a.m. and 40 mg p.m. at the instruction of his wound doctor.  He says while he was taking the 80 mg twice a day of Lasix, he was unable to leave the bathroom for the entire day.  Since starting on the higher dose of the diuretic, he has managed to lose 11 pounds.  He is drinking somewhere between 30 to 40 ounces of fluid.  Unfortunately he is not tracking his weight.  I did recommend him to keep a weight diary.  On physical exam, he still has some degree of lower extremity edema, however this has significantly improved.  I recommended continue on the current dose of the diuretic.  Despite prior diagnosis of obstructive sleep apnea, he says he  was not aware of the diagnosis and has never used a CPAP machine.   Past Medical History:  Diagnosis Date  . ALLERGIC RHINITIS 08/11/2006  . ASTHMA 08/11/2006  . Chronic atrial fibrillation (HCC) 08/2014  . Chronic combined systolic and diastolic CHF (congestive heart failure) [facility])    Cardiology f/u 12/2017: pt volume overloaded (R heart dysf suspected), BNP very high, lasix increased.  Repeat echo 12/2017: normal LV EF, mild DD, +RV syst dysfxn, mod pulm HTN, biatrial enlgmt.  . Chronic constipation   . Chronic renal insufficiency, stage 2 (mild)    Borderline stage III (GFR 60s).  . DIABETES MELLITUS, TYPE II 08/11/2006  . DM W/RENAL MANIFESTATIONS, TYPE II 04/20/2007  . HYPERTENSION 08/11/2006  . Impaired mobility and endurance   . MRSA infection 06/2018   LL venous stasis ulcer infected  . Normocytic anemia 2016-2019   03/2018 B12 normal, iron ok (ferritin borderline low).  Plan repeat CBC at f/u summer 2019 and if decreased from baseline will check hemoccults and iron labs again.  . OBESITY, MORBID 12/14/2007  . OSA (obstructive sleep apnea)    to get sleep study with [facility]) as of 12/03/2018 consult.  . 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  [id number] ID started him on amoxil prophylaxis---ineffective.  End 2018/Jan  2019 penicillin G injections prophyl helpful but pt declined to continue this as of 01/2018 ID f/u.  ID talked him into resuming monthly penicillin G as of 02/2018 f/u.    Marland Kitchen. Restless leg syndrome    Rx'd clonazepam 09/2017 and pt refused to take it after reading the medication's potential side effects.  . Venous stasis ulcers of both lower extremities (HCC)    Severe lymphedema.  wound clinic care ongoing as of 01/2018    Past Surgical History:  Procedure Laterality Date  . Carotid dopplers  07/23/2018   Left NORMAL.  Right 1-39% ICA stenosis, with <50% distal CCA stenosis (not  hemodynamically significant)  . TRANSTHORACIC ECHOCARDIOGRAM  11/2007; 09/2014; 11/2015;12/2017   LV fxn normal, EF normal, mild dilation of left atrium.  2015 grade II diast dysfxn.  2017 EF 55-60%. 2019: LVEF 60-65%, mild RV syst dysf,biatrial enlgmt, mod pulm htn.  Marland Kitchen. URETERAL STENT PLACEMENT    . virtual colonoscopy  01/2011   Normal    Current Medications: Current Meds  Medication Sig  . arginine 500 MG tablet Take by mouth.  Marland Kitchen. b complex vitamins capsule Take 1 capsule by mouth every morning.  . butalbital-acetaminophen-caffeine (FIORICET WITH CODEINE) 50-325-40-30 MG capsule TAKE 1 TO 2 CAPSULES BY  MOUTH EVERY 6 HOURS AS  NEEDED FOR HEADACHE(S)  MANUFACTURER RECOMMENDS NOT EXCEEDING 6 CAPSULES/DAY  . cetirizine (ZYRTEC) 10 MG tablet Take 10 mg by mouth daily.  . Coenzyme Q10 (CO Q 10) 10 MG CAPS Take by mouth. Reported on 02/19/2016  . furosemide (LASIX) 40 MG tablet TAKE 2 TABLETS BY MOUTH TWO TIMES DAILY (Patient taking differently: Take 80 mg in the mornings and 40 mg in the evenings)  . Glutamine 500 MG CAPS Take by mouth.  Marland Kitchen. HYDROcodone-acetaminophen (NORCO/VICODIN) 5-325 MG tablet 1-2 tabs po bid prn pain  . Insulin Lispro Prot & Lispro (HUMALOG MIX 75/25 KWIKPEN) (75-25) 100 UNIT/ML Kwikpen INJECT SUBCUTANEOUSLY 27 UNITS EVERY MORNING AND 22 UNITS EVERY EVENING AT  SUPPER.  . Insulin Pen Needle (B-D ULTRAFINE III SHORT PEN) 31G X 8 MM MISC USE 1  TWICE DAILY  . Insulin Pen Needle (BD ULTRA-FINE PEN NEEDLES) 29G X 12.7MM MISC 1 each by Other route 2 (two) times daily.  . Insulin Pen Needle (BD ULTRA-FINE PEN NEEDLES) 29G X 12.7MM MISC USE TWICE DAILY  . lisinopril (ZESTRIL) 40 MG tablet TAKE 1 TABLET BY MOUTH  DAILY  . metFORMIN (GLUCOPHAGE) 1000 MG tablet TAKE 1 TABLET BY MOUTH  TWICE DAILY WITH MEALS  . metoprolol tartrate (LOPRESSOR) 50 MG tablet TAKE 1 AND 1/2 TABLETS BY  MOUTH TWO TIMES DAILY  . Multiple Vitamin (MULTIVITAMIN) tablet Take 1 tablet by mouth daily.  [name]. ONETOUCH  ULTRA test strip CHECK BLOOD SUGAR TWO TIMES DAILY  . rivaroxaban (XARELTO) 20 MG TABS tablet TAKE 1 TABLET BY MOUTH ONCE DAILY WITH SUPPER  . saw palmetto 500 MG capsule Take 500 mg by mouth daily.  Marland Kitchen. terazosin (HYTRIN) 10 MG capsule TAKE 1 CAPSULE BY MOUTH  ONCE A DAY AT BEDTIME  . triamcinolone ointment (KENALOG) 0.1 % APPLY OINTMENT WITH EACH DRESSING CHANGE TO LEGS AS DIRECTED  . vitamin E 400 UNIT capsule Take 400 Units by mouth every morning. Selenium 50mg   . [DISCONTINUED] diltiazem (TIAZAC) 240 MG 24 hr capsule TAKE 1 CAPSULE BY MOUTH  DAILY   Current Facility-Administered Medications for the 02/09/20 encounter (Office Visit) with Azalee CourseMeng, Faatima Tench, PA  Medication  . penicillin g benzathine (BICILLIN LA) 1200000  UNIT/2ML injection 600,000 Units  . penicillin g benzathine (BICILLIN LA) 1200000 UNIT/2ML injection 600,000 Units  . penicillin g benzathine (BICILLIN LA) 1200000 UNIT/2ML injection 600,000 Units  . penicillin g benzathine (BICILLIN LA) 1200000 UNIT/2ML injection 600,000 Units     Allergies:   Other   Social History   Socioeconomic History  . Marital status: Single    Spouse name: Not on file  . Number of children: Not on file  . Years of education: Not on file  . Highest education level: Not on file  Occupational History  . Not on file  Tobacco Use  . Smoking status: Never Smoker  . Smokeless tobacco: Never Used  Substance and Sexual Activity  . Alcohol use: No  . Drug use: No  . Sexual activity: Not on file  Other Topics Concern  . Not on file  Social History Narrative   Single, no children.   Lives in [place] since 1985 [facility] [place]).   Occupation: Science writer for [employer].   No Tobacco, no alc, drugs.   Exercise: no    Diet: no      Social Determinants of Health   Financial Resource Strain:   . Difficulty of Paying Living Expenses:   Food Insecurity:   . Worried About Programme researcher, broadcasting/film/video in the Last Year:   . Barista in the  Last Year:   Transportation Needs:   . Freight forwarder (Medical):   Marland Kitchen Lack of Transportation (Non-Medical):   Physical Activity:   . Days of Exercise per Week:   . Minutes of Exercise per Session:   Stress:   . Feeling of Stress :   Social Connections:   . Frequency of Communication with Friends and Family:   . Frequency of Social Gatherings with Friends and Family:   . Attends Religious Services:   . Active Member of Clubs or Organizations:   . Attends Banker Meetings:   Marland Kitchen Marital Status:      Family History: The patient's family history includes Diabetes in his mother and sister; Hydrocephalus in his sister.  ROS:   Please see the history of present illness.     All other systems reviewed and are negative.  EKGs/Labs/Other Studies Reviewed:    The following studies were reviewed today:  Echo 12/29/2017 LV EF: 60% -  65%  Study Conclusions   - Left ventricle: The cavity size was normal. Wall thickness was  normal. Systolic function was normal. The estimated ejection  fraction was in the range of 60% to 65%. Wall motion was normal;  there were no regional wall motion abnormalities.  - Ventricular septum: The contour showed diastolic flattening.  - Aortic valve: Moderately calcified annulus.  - Mitral valve: Moderately calcified annulus.  - Left atrium: The atrium was mildly to moderately dilated.  - Right ventricle: The cavity size was mildly dilated. Systolic  function was mildly reduced.  - Right atrium: The atrium was moderately to severely dilated.  - Pulmonary arteries: Systolic pressure was moderately increased.  PA peak pressure: 53 mm Hg (S).   EKG:  EKG is  ordered today.  The ekg ordered today demonstrates atrial fibrillation, right bundle branch block  Recent Labs: 07/06/2019: ALT 10 01/05/2020: Hemoglobin 12.6; Platelets 187.0 02/09/2020: BUN 20; Creatinine, Ser 1.33; Potassium 4.6; Sodium 141  Recent Lipid Panel    Component  Value Date/Time   CHOL 106 07/06/2019 1133   TRIG 55.0 07/06/2019 1133   HDL  40.20 07/06/2019 1133   CHOLHDL 3 07/06/2019 1133   VLDL 11.0 07/06/2019 1133   LDLCALC 55 07/06/2019 1133    Physical Exam:    VS:  BP (!) 148/70   Pulse (!) 53   Temp (!) 97.5 F (36.4 C) Comment: Forehead  Ht 5\' 9"  (1.753 m)   Wt (!) 382 lb 9.6 oz (173.5 kg)   SpO2 97%   BMI 56.50 kg/m     Wt Readings from Last 3 Encounters:  02/09/20 (!) 382 lb 9.6 oz (173.5 kg)  01/05/20 (!) 391 lb 3.2 oz (177.4 kg)  12/06/19 (!) 393 lb 3.2 oz (178.4 kg)     GEN:  Well nourished, well developed in no acute distress HEENT: Normal NECK: No JVD; No carotid bruits LYMPHATICS: No lymphadenopathy CARDIAC: Irregularly irregular, no murmurs, rubs, gallops RESPIRATORY:  Clear to auscultation without rales, wheezing or rhonchi  ABDOMEN: Soft, non-tender, non-distended MUSCULOSKELETAL:  1+ edema; No deformity  SKIN: Warm and dry NEUROLOGIC:  Alert and oriented x 3 PSYCHIATRIC:  Normal affect   ASSESSMENT:    1. Chronic combined systolic and diastolic heart failure ([place])   2. Medication management   3. Chronic atrial fibrillation (HCC)   4. Stage 3 chronic kidney disease, unspecified whether stage 3a or 3b CKD   5. Hypertension, essential   6. Controlled type 2 diabetes mellitus without complication, without long-term current use of insulin ([place])   7. Morbid obesity due to excess calories ([place])    PLAN:    In order of problems listed above:  1. Chronic combined systolic and diastolic heart failure: Patient has managed to lose 11 pounds recently on the higher dose of diuretic.  However instead of the 80 mg twice daily dosing that was instructed, he is actually taking a lower dose at 80 mg a.m. and 40 mg p.m.  He says when he was taking the higher dose, he was unable to leave the bathroom.  Since he is having good diuresis and weight loss on the current therapy, I elected to leave him on the 80 mg a.m. and 40 mg  p.m. dosage.  2. Chronic atrial fibrillation: On metoprolol, diltiazem and Xarelto  3. CKD stage III: Stable renal function  4. Hypertension: Continue on current therapy  5. DM2: On insulin, managed by primary care provider  6. Morbid obesity: Weight loss imperative   Medication Adjustments/Labs and Tests Ordered: Current medicines are reviewed at length with the patient today.  Concerns regarding medicines are outlined above.  Orders Placed This Encounter  Procedures  . Basic metabolic panel   No orders of the defined types were placed in this encounter.   Patient Instructions  Medication Instructions:   Continue Lasix 80 mg in the mornings and 40 mg in the evenings  *If you need a refill on your cardiac medications before your next appointment, please call your pharmacy*  Lab Work: Your physician recommends that you return for lab work TODAY:   BMET  If you have labs (blood work) drawn today and your tests are completely normal, you will receive your results only by: Marland Kitchen MyChart Message (if you have MyChart) OR . A paper copy in the mail If you have any lab test that is abnormal or we need to change your treatment, we will call you to review the results.   Testing/Procedures: NONE ordered at this time of appointment   Follow-Up: At [facility], you and your health needs are our priority.  As part  of our continuing mission to provide you with exceptional heart care, we have created designated Provider Care Teams.  These Care Teams include your primary Cardiologist (physician) and Advanced Practice Providers (APPs -  Physician Assistants and Nurse Practitioners) who all work together to provide you with the care you need, when you need it.  We recommend signing up for the patient portal called "MyChart".  Sign up information is provided on this After Visit Summary.  MyChart is used to connect with patients for Virtual Visits (Telemedicine).  Patients are able to view  lab/test results, encounter notes, upcoming appointments, etc.  Non-urgent messages can be sent to your provider as well.   To learn more about what you can do with MyChart, go to [email or web address].    Your next appointment:   4 month(s)  The format for your next appointment:   In Person  Provider:   Kirtland Bouchard [place] Hilty, MD  Other Instructions  Weigh yourself daily in the mornings.     Ramond Dial, [place]  02/11/2020 11:51 PM    [facility]

## 2020-02-09 NOTE — Patient Instructions (Addendum)
Medication Instructions:   Continue Lasix 80 mg in the mornings and 40 mg in the evenings  *If you need a refill on your cardiac medications before your next appointment, please call your pharmacy*  Lab Work: Your physician recommends that you return for lab work TODAY:   BMET  If you have labs (blood work) drawn today and your tests are completely normal, you will receive your results only by: Marland Kitchen MyChart Message (if you have MyChart) OR . A paper copy in the mail If you have any lab test that is abnormal or we need to change your treatment, we will call you to review the results.   Testing/Procedures: NONE ordered at this time of appointment   Follow-Up: At [facility], you and your health needs are our priority.  As part of our continuing mission to provide you with exceptional heart care, we have created designated Provider Care Teams.  These Care Teams include your primary Cardiologist (physician) and Advanced Practice Providers (APPs -  Physician Assistants and Nurse Practitioners) who all work together to provide you with the care you need, when you need it.  We recommend signing up for the patient portal called "MyChart".  Sign up information is provided on this After Visit Summary.  MyChart is used to connect with patients for Virtual Visits (Telemedicine).  Patients are able to view lab/test results, encounter notes, upcoming appointments, etc.  Non-urgent messages can be sent to your provider as well.   To learn more about what you can do with MyChart, go to [email or web address].    Your next appointment:   4 month(s)  The format for your next appointment:   In Person  Provider:   Kirtland Bouchard [place] Hilty, MD  Other Instructions  Weigh yourself daily in the mornings.

## 2020-02-09 NOTE — Progress Notes (Signed)
[name] ([id number]) Visit Report for 02/09/2020 Arrival Information Details Patient Name: Date of Service: [name], [name] 02/09/2020 11:00 AM Medical Record [id number] Patient Account Number: [id number] Date of Birth/Sex: Treating RN: 07-12-1951 (69 y.o. [name], [name] Primary Care Sharma Lawrance: Nicoletta Ba Other Clinician: Referring Rye Dorado: Treating Alice Burnside/Extender:[name], [name], [name] in Treatment: 211 Visit Information History Since Last Visit Added or deleted any medications: No Patient Arrived: Walker Any new allergies or adverse reactions: No Arrival Time: 11:26 Had a fall or experienced change in No Accompanied By: self activities of daily living that may affect Transfer Assistance: Manual risk of falls: Patient Identification Verified: Yes Signs or symptoms of abuse/neglect since last No Secondary Verification Process Completed: Yes visito Patient Requires Transmission-Based No Hospitalized since last visit: No Precautions: Implantable device outside of the clinic excluding No Patient Has Alerts: Yes cellular tissue based products placed in the center since last visit: Has Dressing in Place as Prescribed: Yes Pain Present Now: No Electronic Signature(s) Signed: 02/09/2020 5:46:37 PM By: Shawn Stall Entered By: Shawn Stall on 02/09/2020 11:26:58 -------------------------------------------------------------------------------- Compression Therapy Details Patient Name: Date of Service: OSBY, SWEETIN 02/09/2020 11:00 AM Medical Record [id number] Patient Account Number: [id number] Date of Birth/Sex: Treating RN: July 14, 1951 (69 y.o. [name] Primary Care Lathaniel Legate: Nicoletta Ba Other Clinician: Referring Birdie Fetty: Treating Ciearra Rufo/Extender:[name], [name], [name] in Treatment: 211 Compression Therapy Performed for Wound Wound #163 Left,Medial Lower Leg Assessment: Performed By: Clinician Shawn Stall, RN Compression Type: Four Layer Electronic Signature(s) Signed: 02/09/2020 5:46:37 PM By: Shawn Stall Entered By: Shawn Stall on 02/09/2020 11:32:52 -------------------------------------------------------------------------------- Compression Therapy Details Patient Name: Date of Service: NELVIN, TOMB 02/09/2020 11:00 AM Medical Record [id number] Patient Account Number: [id number] Date of Birth/Sex: Treating RN: 1951/05/24 (69 y.o. [name] Primary Care Kurk Corniel: Nicoletta Ba Other Clinician: Referring Keyona Emrich: Treating Nancyjo Givhan/Extender:[name], [name], [name] in Treatment: 211 Compression Therapy Performed for Wound Wound #164 Right,Medial Lower Leg Assessment: Performed By: Clinician Shawn Stall, RN Compression Type: Henriette Combs Electronic Signature(s) Signed: 02/09/2020 5:46:37 PM By: Shawn Stall Entered By: Shawn Stall on 02/09/2020 11:33:13 -------------------------------------------------------------------------------- Encounter Discharge Information Details Patient Name: Date of Service: [name] 02/09/2020 11:00 AM Medical Record [id number] Patient Account Number: [id number] Date of Birth/Sex: Treating RN: 11/08/1950 (69 y.o. [name] Primary Care Jemari Hallum: Nicoletta Ba Other Clinician: Referring Margy Sumler: Treating Athanasios Heldman/Extender:[name], [name], [name] in Treatment: 211 Encounter Discharge Information Items Discharge Condition: Stable Ambulatory Status: Walker Discharge Destination: Home Transportation: Private Auto Accompanied By: self Schedule Follow-up Appointment: Yes Clinical Summary of Care: Electronic Signature(s) Signed: 02/09/2020 5:46:37 PM By: Shawn Stall Entered By: Shawn Stall on 02/09/2020 11:36:38 -------------------------------------------------------------------------------- Patient/Caregiver Education Details Patient Name: Date of Service: Nims, Jovanne J.  4/7/2021andnbsp11:00 AM Medical Record [id number] Patient Account Number: [id number] Date of Birth/Gender: Treating RN: April 22, 1951 (69 y.o. [name] Primary Care Physician: Nicoletta Ba Other Clinician: Referring Physician: Treating Physician/Extender:[name], [name], [name] in Treatment: 211 Education Assessment Education Provided To: Patient Education Topics Provided Wound/Skin Impairment: Handouts: Skin Care Do's and Dont's Methods: Explain/Verbal Responses: Reinforcements needed Electronic Signature(s) Signed: 02/09/2020 5:46:37 PM By: Shawn Stall Entered By: Shawn Stall on 02/09/2020 11:36:21 -------------------------------------------------------------------------------- Wound Assessment Details Patient Name: Date of Service: DANIL, WEDGE 02/09/2020 11:00 AM Medical Record [id number] Patient Account Number: [id number] Date of Birth/Sex: Treating RN: Dec 16, 1950 (69 y.o. [name] Primary Care Quamel Fitzmaurice: Nicoletta Ba Other Clinician: Referring Cana Mignano: Treating Hedda Crumbley/Extender:[name], [name], [name]  in Treatment: 211 Wound Status Wound Number: [id number] Primary Lymphedema Etiology: Wound Location: Right Toe Great Wound Open Wounding Event: Gradually Appeared Status: Date Acquired: 01/19/2020 Comorbid Chronic sinus problems/congestion, Weeks Of Treatment: 3 History: Arrhythmia, Hypertension, Peripheral Arterial Clustered Wound: No Disease, Type II Diabetes, History of Burn, Gout, Confinement Anxiety Wound Measurements Length: (cm) 0.1 % Reduction Width: (cm) 0.1 % Reduct Depth: (cm) 0.1 Epitheli Area: (cm) 0.008 Tunneli Volume: (cm) 0.001 Undermi Wound Description Full Thickness Without Exposed Support Foul Od Classification: Structures Slough/ Wound Distinct, outline attached Margin: Exudate Medium Amount: Exudate Serosanguineous Type: Exudate red, brown Color: Wound Bed Granulation  Amount: Large (67-100%) Granulation Quality: Red, Pink Fascia Necrotic Amount: None Present (0%) Fat Lay Tendon Muscle Joint E Bone Ex or After Cleansing: No Fibrino No Exposed Structure Exposed: No er (Subcutaneous Tissue) Exposed: No Exposed: No Exposed: No xposed: No posed: No in Area: 98.6% ion in Volume: 98.3% alization: Large (67-100%) ng: No ning: No Treatment Notes Wound #161 (Right Toe Great) 1. Cleanse With Wound Cleanser Soap and water 2. Periwound Care Moisturizing lotion 3. Primary Dressing Applied Calcium Alginate Ag 4. Secondary Dressing Dry Gauze Roll Gauze 5. Secured With [employer]) Signed: 02/09/2020 5:46:37 PM By: Shawn Stall Entered By: Shawn Stall on 02/09/2020 11:31:14 -------------------------------------------------------------------------------- Wound Assessment Details Patient Name: Date of Service: RANDOLF, SANSOUCIE 02/09/2020 11:00 AM Medical Record [id number] Patient Account Number: [id number] Date of Birth/Sex: Treating RN: 1951-01-25 (69 y.o. [name] Primary Care Audi Conover: Nicoletta Ba Other Clinician: Referring Karimah Winquist: Treating Berkeley Vanaken/Extender:[name], [name], [name] in Treatment: 211 Wound Status Wound Number: 162 Primary Lymphedema Etiology: Wound Location: Right Toe Second Wound Open Wounding Event: Gradually Appeared Status: Date Acquired: 01/25/2020 Comorbid Chronic sinus problems/congestion, Weeks Of Treatment: 2 History: Arrhythmia, Hypertension, Peripheral Arterial Clustered Wound: No Disease, Type II Diabetes, History of Burn, Gout, Confinement Anxiety Wound Measurements Length: (cm) 0.9 Width: (cm) 1 Depth: (cm) 0.1 Area: (cm) 0.707 Volume: (cm) 0.071 Wound Description Classification: Partial Thickness Wound Margin: Distinct, outline attached Exudate Amount: Medium Exudate Type: Serosanguineous Exudate Color: red, brown Wound  Bed Granulation Amount: Large (67-100%) Granulation Quality: Pink Necrotic Amount: None Present (0%) After Cleansing: No rino No Exposed Structure osed: No (Subcutaneous Tissue) Exposed: No osed: No osed: No sed: No ed: No % Reduction in Area: -401.4% % Reduction in Volume: -407.1% Epithelialization: None Tunneling: No Undermining: No Foul Odor Slough/Fib Fascia Exp Fat Layer Tendon Exp Muscle Exp Joint Expo Bone Expos Treatment Notes Wound #162 (Right Toe Second) 1. Cleanse With Wound Cleanser Soap and water 2. Periwound Care Moisturizing lotion 3. Primary Dressing Applied Calcium Alginate Ag 4. Secondary Dressing Dry Gauze Roll Gauze 5. Secured With [employer]) Signed: 02/09/2020 5:46:37 PM By: Shawn Stall Entered By: Shawn Stall on 02/09/2020 11:31:44 -------------------------------------------------------------------------------- Wound Assessment Details Patient Name: Date of Service: DYLON, CORREA 02/09/2020 11:00 AM Medical Record [id number] Patient Account Number: [id number] Date of Birth/Sex: Treating RN: 1951-06-14 (69 y.o. [name] Primary Care Shalah Estelle: Nicoletta Ba Other Clinician: Referring Eddison Searls: Treating Jeno Calleros/Extender:[name], [name], [name] in Treatment: 211 Wound Status Wound Number: [id number] Primary Etiology: Lymphedema Wound Location: Left, Medial Lower Leg Wound Status: Open Wounding Event: Blister Date Acquired: 01/20/2020 Weeks Of Treatment: 2 Clustered Wound: No Wound Measurements Length: (cm) 0 % Reduct Width: (cm) 0 % Reduct Depth: (cm) 0 Area: (cm) 0 Volume: (cm) 0 Wound Description Full Thickness Without Exposed Support Classification: Structures ion in Area: 100% ion in Volume: 100%  Treatment Notes Wound #163 (Left, Medial Lower Leg) 1. Cleanse With Wound Cleanser Soap and water 2. Periwound Care Moisturizing lotion 3. Primary Dressing  Applied Calcium Alginate Ag 4. Secondary Dressing ABD Pad Dry Gauze 6. Support Layer Applied 4 layer compression wrap Notes netting. Electronic Signature(s) Signed: 02/09/2020 5:46:37 PM By: Deon Pilling Entered By: Deon Pilling on 02/09/2020 11:29:17 -------------------------------------------------------------------------------- Wound Assessment Details Patient Name: Date of Service: TRAVES, MAJCHRZAK 02/09/2020 11:00 AM Medical Record [id number] Patient Account Number: [id number] Date of Birth/Sex: Treating RN: 1951/09/25 (69 y.o. [name] Primary Care Azaliyah Kennard: [name] Other Clinician: Referring Laticha Ferrucci: Treating Peregrine Nolt/Extender:[name], Dema Severin, [name] in Treatment: 211 Wound Status Wound Number: 164 Primary Etiology: Lymphedema Wound Location: Right, Medial Lower Leg Wound Status: Open Wounding Event: Blister Date Acquired: 02/02/2020 Weeks Of Treatment: 1 Clustered Wound: No Wound Measurements Length: (cm) 3 % Reduc Width: (cm) 6.5 % Reduc Depth: (cm) 0.1 Area: (cm) 15.315 Volume: (cm) 1.532 Wound Description Classification: Full Thickness Without Exposed Support Structures tion in Area: 0% tion in Volume: 0% Treatment Notes Wound #164 (Right, Medial Lower Leg) 1. Cleanse With Wound Cleanser Soap and water 2. Periwound Care Moisturizing lotion 3. Primary Dressing Applied Calcium Alginate Ag 4. Secondary Dressing ABD Pad 6. Support Layer [employer] Notes netting. Electronic Signature(s) Signed: 02/09/2020 5:46:37 PM By: Deon Pilling Entered By: Deon Pilling on 02/09/2020 11:29:17 -------------------------------------------------------------------------------- Wound Assessment Details Patient Name: Date of Service: ZAIDYN, CLAIRE 02/09/2020 11:00 AM Medical Record [id number] Patient Account Number: [id number] Date of Birth/Sex: Treating RN: October 11, 1951 (69 y.o. [name] Primary Care  Danijela Vessey: [name] Other Clinician: Referring Datra Clary: Treating Kaidynce Pfister/Extender:[name], Dema Severin, [name] in Treatment: 211 Wound Status Wound Number: 165 Primary Etiology: Lymphedema Wound Location: Left Toe Great Wound Status: Open Wounding Event: Gradually Appeared Date Acquired: 02/02/2020 Weeks Of Treatment: 1 Clustered Wound: No Wound Measurements Length: (cm) 0.8 % Reduc Width: (cm) 0.7 % Reduc Depth: (cm) 0.1 Area: (cm) 0.44 Volume: (cm) 0.044 Wound Description Classification: Full Thickness Without Exposed Support Structures tion in Area: 0% tion in Volume: 0% Treatment Notes Wound #165 (Left Toe Great) 1. Cleanse With Wound Cleanser Soap and water 2. Periwound Care Moisturizing lotion 3. Primary Dressing Applied Calcium Alginate Ag 4. Secondary Dressing Dry Gauze Roll Gauze 5. Secured With [employer]) Signed: 02/09/2020 5:46:37 PM By: Deon Pilling Entered By: Deon Pilling on 02/09/2020 11:29:18 -------------------------------------------------------------------------------- Wound Assessment Details Patient Name: Date of Service: TRU, RANA 02/09/2020 11:00 AM Medical Record [id number] Patient Account Number: [id number] Date of Birth/Sex: Treating RN: 1951-08-15 (69 y.o. [name] Primary Care Jujuan Dugo: [name] Other Clinician: Referring Giovan Pinsky: Treating Jarrin Staley/Extender:[name], Dema Severin, [name] in Treatment: 211 Wound Status Wound Number: [id number] Primary Etiology: Lymphedema Wound Location: Left, Dorsal Foot Wound Status: Open Wounding Event: Gradually Appeared Date Acquired: 02/02/2020 Weeks Of Treatment: 1 Clustered Wound: No Wound Measurements Length: (cm) 1 % Reduc Width: (cm) 1 % Reduc Depth: (cm) 0.1 Area: (cm) 0.785 Volume: (cm) 0.079 Wound Description Classification: Full Thickness Without Exposed Support Structures tion in Area: 0% tion in  Volume: 0% Treatment Notes Wound #166 (Left, Dorsal Foot) 1. Cleanse With Wound Cleanser Soap and water 2. Periwound Care Moisturizing lotion 3. Primary Dressing Applied Calcium Alginate Ag 4. Secondary Dressing ABD Pad Dry Gauze 6. Support Layer Applied 4 layer compression wrap Notes netting. Electronic Signature(s) Signed: 02/09/2020 5:46:37 PM By: Deon Pilling Entered By: Deon Pilling on 02/09/2020 11:29:18 -------------------------------------------------------------------------------- Vitals Details Patient  Name: Date of Service: LAMONDRE, WESCHE 02/09/2020 11:00 AM Medical Record [id number] Patient Account Number: [id number] Date of Birth/Sex: Treating RN: 10/09/1951 (69 y.o. [name] Primary Care Marixa Mellott: Nicoletta Ba Other Clinician: Referring Kjersten Ormiston: Treating Harlie Buening/Extender:[name], [name], [name] in Treatment: 211 Vital Signs Time Taken: 11:27 Temperature (F): 97.7 Height (in): 70 Pulse (bpm): 57 Weight (lbs): 380.2 Respiratory Rate (breaths/min): 18 Body Mass Index (BMI): 54.5 Blood Pressure (mmHg): 151/70 Reference Range: 80 - 120 mg / dl Electronic Signature(s) Signed: 02/09/2020 5:46:37 PM By: Shawn Stall Entered By: Shawn Stall on 02/09/2020 11:28:33

## 2020-02-09 NOTE — Progress Notes (Signed)
[name] is a 69 y.o. male presents to the office today for bicillin injections, per physician's orders.  Bicillin 600,000 Units was administered bilaterally in Right and Left upper outter quad today. Patient tolerated injection.  Patient due for follow up labs/provider appt: Yes. Date due: 04/06/20, appt made Yes Next injection due: 03/10/20, appt made: no  Misty Stanley, CMA

## 2020-02-10 ENCOUNTER — Other Ambulatory Visit: Payer: Self-pay | Admitting: Family Medicine

## 2020-02-10 DIAGNOSIS — I890 Lymphedema, not elsewhere classified: Secondary | ICD-10-CM | POA: Diagnosis not present

## 2020-02-10 DIAGNOSIS — R29898 Other symptoms and signs involving the musculoskeletal system: Secondary | ICD-10-CM | POA: Diagnosis not present

## 2020-02-10 LAB — BASIC METABOLIC PANEL
BUN/Creatinine Ratio: 15 (ref 10–24)
BUN: 20 mg/dL (ref 8–27)
CO2: 24 mmol/L (ref 20–29)
Calcium: 9.2 mg/dL (ref 8.6–10.2)
Chloride: 100 mmol/L (ref 96–106)
Creatinine, Ser: 1.33 mg/dL — ABNORMAL HIGH (ref 0.76–1.27)
GFR calc Af Amer: 63 mL/min/{1.73_m2} (ref >59)
GFR calc non Af Amer: 55 mL/min/{1.73_m2} — ABNORMAL LOW (ref >59)
Glucose: 132 mg/dL — ABNORMAL HIGH (ref 65–99)
Potassium: 4.6 mmol/L (ref 3.5–5.2)
Sodium: 141 mmol/L (ref 134–144)

## 2020-02-10 NOTE — Progress Notes (Signed)
Stable renal function and electrolyte

## 2020-02-11 ENCOUNTER — Other Ambulatory Visit: Payer: Self-pay

## 2020-02-14 ENCOUNTER — Other Ambulatory Visit: Payer: Self-pay

## 2020-02-14 DIAGNOSIS — R29898 Other symptoms and signs involving the musculoskeletal system: Secondary | ICD-10-CM | POA: Diagnosis not present

## 2020-02-14 DIAGNOSIS — I890 Lymphedema, not elsewhere classified: Secondary | ICD-10-CM | POA: Diagnosis not present

## 2020-02-14 MED ORDER — HYDROCODONE-ACETAMINOPHEN 5-325 MG PO TABS: ORAL_TABLET | ORAL | 0 refills | Status: DC

## 2020-02-14 NOTE — Telephone Encounter (Signed)
Requesting: hydrocodone Contract:04/05/19 UDS:n/a Last Visit:01/05/20 Next Visit:04/06/20 Last Refill:10/06/19(60,0)  Please Advise. Medication pending

## 2020-02-16 ENCOUNTER — Other Ambulatory Visit: Payer: Self-pay

## 2020-02-16 ENCOUNTER — Encounter (HOSPITAL_BASED_OUTPATIENT_CLINIC_OR_DEPARTMENT_OTHER): Payer: Medicare Other | Admitting: Physician Assistant

## 2020-02-16 DIAGNOSIS — I10 Essential (primary) hypertension: Secondary | ICD-10-CM | POA: Diagnosis not present

## 2020-02-16 DIAGNOSIS — L97511 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 of skin: Secondary | ICD-10-CM | POA: Diagnosis not present

## 2020-02-16 DIAGNOSIS — E1140 Type 2 diabetes mellitus with diabetic neuropathy, unspecified: Secondary | ICD-10-CM | POA: Diagnosis not present

## 2020-02-16 DIAGNOSIS — M109 Gout, unspecified: Secondary | ICD-10-CM | POA: Diagnosis not present

## 2020-02-16 DIAGNOSIS — L97221 Non-pressure chronic ulcer of left calf limited to breakdown of skin: Secondary | ICD-10-CM | POA: Diagnosis not present

## 2020-02-16 DIAGNOSIS — E11621 Type 2 diabetes mellitus with foot ulcer: Secondary | ICD-10-CM | POA: Diagnosis not present

## 2020-02-16 DIAGNOSIS — I890 Lymphedema, not elsewhere classified: Secondary | ICD-10-CM | POA: Diagnosis not present

## 2020-02-16 DIAGNOSIS — I872 Venous insufficiency (chronic) (peripheral): Secondary | ICD-10-CM | POA: Diagnosis not present

## 2020-02-16 DIAGNOSIS — L03116 Cellulitis of left lower limb: Secondary | ICD-10-CM | POA: Diagnosis not present

## 2020-02-16 DIAGNOSIS — Z794 Long term (current) use of insulin: Secondary | ICD-10-CM | POA: Diagnosis not present

## 2020-02-16 DIAGNOSIS — E11622 Type 2 diabetes mellitus with other skin ulcer: Secondary | ICD-10-CM | POA: Diagnosis not present

## 2020-02-16 DIAGNOSIS — L97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 right calf limited to breakdown of skin: Secondary | ICD-10-CM | POA: Diagnosis not present

## 2020-02-16 DIAGNOSIS — I272 Pulmonary hypertension, unspecified: Secondary | ICD-10-CM | POA: Diagnosis not present

## 2020-02-16 DIAGNOSIS — L97521 Non-pressure chronic ulcer of other 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 of skin: Secondary | ICD-10-CM | POA: Diagnosis not present

## 2020-02-16 DIAGNOSIS — I482 Chronic atrial fibrillation, unspecified: Secondary | ICD-10-CM | POA: Diagnosis not present

## 2020-02-16 DIAGNOSIS — Z9119 Patient's noncompliance with other medical treatment and regimen: Secondary | ICD-10-CM | POA: Diagnosis not present

## 2020-02-16 DIAGNOSIS — I87333 Chronic venous hypertension (idiopathic) with ulcer and inflammation of bilateral lower extremity: Secondary | ICD-10-CM | POA: Diagnosis not present

## 2020-02-16 NOTE — Progress Notes (Signed)
[name], [name] ([id number]) Visit Report for 01/19/2020 Arrival Information Details Patient Name: Date of Service: [name], [name] 01/19/2020 10:30 AM Medical Record [id number] Patient Account Number: [id number] Date of Birth/Sex: Treating RN: [birth year] (69 y.o. [name] Primary Care Provider: Shawnie [name] Other Clinician: Referring Provider: Treating Provider/Extender:[name], [name], [name] in Treatment: 208 Visit Information History Since Last Visit Walker Added or deleted any medications: No Patient Arrived: Any new allergies or adverse reactions: No Arrival Time: 10:52 Had a fall or experienced change in No Accompanied By: self activities of daily living that may affect Transfer Assistance: None risk of falls: Patient Identification Verified: Yes Signs or symptoms of abuse/neglect since last No Secondary Verification Process Completed: Yes visito Patient Requires Transmission-Based No Hospitalized since last visit: No Precautions: Implantable device outside of the clinic excluding No Patient Has Alerts: Yes cellular tissue based products placed in the center since last visit: Has Dressing in Place as Prescribed: Yes Pain Present Now: No Electronic Signature(s) Signed: 02/16/2020 9:21:08 AM By: Sandre Kitty Entered By: Sandre Kitty on 01/19/2020 10:52:37 -------------------------------------------------------------------------------- Clinic Level of Care Assessment Details Patient Name: Date of Service: [name] 01/19/2020 10:30 AM Medical Record [id number] Patient Account Number: [id number] Date of Birth/Sex: Treating RN: October 16, 1951 (69 y.o. [name] Primary Care Provider: Shawnie [name] Other Clinician: Referring Provider: Treating Provider/Extender:[name], [name], [name] in Treatment: 208 Clinic Level of Care Assessment Items TOOL 4 Quantity Score [] - Use when only an EandM is  performed on FOLLOW-UP visit 0 ASSESSMENTS - Nursing Assessment / Reassessment X - Reassessment of Co-morbidities (includes updates in patient status) 1 10 X - Reassessment of Adherence to Treatment Plan 1 5 ASSESSMENTS - Wound and Skin Assessment / Reassessment [] - Simple Wound Assessment / Reassessment - one wound 0 X - Complex Wound Assessment / Reassessment - multiple wounds 2 5 [] - Dermatologic / Skin Assessment (not related to wound area) 0 ASSESSMENTS - Focused Assessment X - Circumferential Edema Measurements - multi extremities 2 5 [] - Nutritional Assessment / Counseling / Intervention 0 X - Lower Extremity Assessment (monofilament, tuning fork, pulses) 1 5 [] - Peripheral Arterial Disease Assessment (using hand held doppler) 0 ASSESSMENTS - Ostomy and/or Continence Assessment and Care [] - Incontinence Assessment and Management 0 [] - Ostomy Care Assessment and Management (repouching, etc.) 0 PROCESS - Coordination of Care X - Simple Patient / Family Education for ongoing care 1 15 [] - Complex (extensive) Patient / Family Education for ongoing care 0 X - Staff obtains Programmer, systems, Records, Test Results / Process Orders 1 10 [] - Staff telephones HHA, Nursing Homes / Clarify orders / etc 0 [] - Routine Transfer to another Facility (non-emergent condition) 0 [] - Routine Hospital Admission (non-emergent condition) 0 [] - New Admissions / Biomedical engineer / Ordering NPWT, Apligraf, etc. 0 [] - Emergency Hospital Admission (emergent condition) 0 X - Simple Discharge Coordination 1 10 [] - Complex (extensive) Discharge Coordination 0 PROCESS - Special Needs [] - Pediatric / Minor Patient Management 0 [] - Isolation Patient Management 0 [] - Hearing / Language / Visual special needs 0 [] - Assessment of Community assistance (transportation, D/C planning, etc.) 0 [] - Additional assistance / Altered mentation 0 [] - Support Surface(s) Assessment (bed, cushion, seat, etc.)  0 INTERVENTIONS - Wound Cleansing / Measurement [] - Simple Wound Cleansing - one wound 0 X - Complex Wound Cleansing - multiple wounds 2 5 X - Wound  Imaging (photographs - any number of wounds) 1 5 [] - Wound Tracing (instead of photographs) 0 X - Simple Wound Measurement - one wound 1 5 [] - Complex Wound Measurement - multiple wounds 0 INTERVENTIONS - Wound Dressings X - Small Wound Dressing one or multiple wounds 1 10 [] - Medium Wound Dressing one or multiple wounds 0 [] - Large Wound Dressing one or multiple wounds 0 X - Application of Medications - topical 1 5 [] - Application of Medications - injection 0 INTERVENTIONS - Miscellaneous [] - External ear exam 0 [] - Specimen Collection (cultures, biopsies, blood, body fluids, etc.) 0 [] - Specimen(s) / Culture(s) sent or taken to Lab for analysis 0 [] - Patient Transfer (multiple staff / Civil Service fast streamer / Similar devices) 0 [] - Simple Staple / Suture removal (25 or less) 0 [] - Complex Staple / Suture removal (26 or more) 0 [] - Hypo / Hyperglycemic Management (close monitor of Blood Glucose) 0 [] - Ankle / Brachial Index (ABI) - do not check if billed separately 0 X - Vital Signs 1 5 Has the patient been seen at the hospital within the last three years: Yes Total Score: 115 Level Of Care: New/Established - Level 3 Electronic Signature(s) Signed: 01/19/2020 4:42:28 PM By: Baruch Gouty RN, BSN Entered By: Baruch Gouty on 01/19/2020 11:54:34 -------------------------------------------------------------------------------- Encounter Discharge Information Details Patient Name: Date of Service: [name] 01/19/2020 10:30 AM Medical Record [id number] Patient Account Number: [id number] Date of Birth/Sex: Treating RN: [birth year] (69 y.o. [name] Primary Care [name]: [name] Other Clinician: Referring Floye Fesler: Treating Beauden Tremont/Extender:[name], [name], [name] in Treatment:  208 Encounter Discharge Information Items Discharge Condition: Stable Ambulatory Status: Walker Discharge Destination: Home Transportation: Private Auto Accompanied By: self Schedule Follow-up Appointment: Yes Clinical Summary of Care: Electronic Signature(s) Signed: 01/19/2020 4:31:34 PM By: Deon Pilling Entered By: Deon Pilling on 01/19/2020 12:10:02 -------------------------------------------------------------------------------- Lower Extremity Assessment Details Patient Name: Date of Service: [name], [name] 01/19/2020 10:30 AM Medical Record [id number] Patient Account Number: [id number] Date of Birth/Sex: Treating RN: 1951/11/03 (69 y.o. [name] Primary Care Geniyah Eischeid: [name] Other Clinician: Referring Pria Klosinski: Treating Shepard Keltz/Extender:[name], [name], [name] in Treatment: 208 Edema Assessment Assessed: [Left: Yes] [Right: No] Edema: [Left: Yes] [Right: Yes] Calf Left: Right: Point of Measurement: 31 cm From Medial Instep 38 cm 45 cm Ankle Left: Right: Point of Measurement: 12 cm From Medial Instep 27 cm 27.5 cm Electronic Signature(s) Signed: 01/19/2020 4:31:34 PM By: Deon Pilling Entered By: Deon Pilling on 01/19/2020 11:27:34 -------------------------------------------------------------------------------- Multi-Disciplinary Care Plan Details Patient Name: Date of Service: [name] 01/19/2020 10:30 AM Medical Record [id number] Patient Account Number: [id number] Date of Birth/Sex: Treating RN: [birth year] (69 y.o. [name] Primary Care Kadesia Robel: [name] Other Clinician: Referring Meshilem Machuca: Treating Derell Bruun/Extender:[name], [name], [name] in Treatment: 208 Active Inactive Venous Leg Ulcer Nursing Diagnoses: Actual venous Insuffiency (use after diagnosis is confirmed) Goals: Patient will maintain optimal edema control Date Initiated: 09/10/2016 Target Resolution Date:  01/26/2020 Goal Status: Active Verify adequate tissue perfusion prior to therapeutic compression application Date Initiated: 09/10/2016 Date Inactivated: 11/28/2016 Goal Status: Met Interventions: Assess peripheral edema status every visit. Compression as ordered Provide education on venous insufficiency Notes: edema not contolled above wraps, pt not using lymoh pumps regularly Wound/Skin Impairment Nursing Diagnoses: Impaired tissue integrity Goals: Patient/caregiver will verbalize understanding of skin care regimen Date Initiated: 09/10/2016 Target Resolution Date: 01/26/2020 Goal Status: Active Interventions: Assess patient/caregiver ability  to perform ulcer/skin care regimen upon admission and as needed Assess ulceration(s) every visit Provide education on ulcer and skin care Notes: Electronic Signature(s) Signed: 01/19/2020 4:42:28 PM By: Baruch Gouty RN, BSN Entered By: Baruch Gouty on 01/19/2020 10:40:27 -------------------------------------------------------------------------------- Pain Assessment Details Patient Name: Date of Service: [name] 01/19/2020 10:30 AM Medical Record [id number] Patient Account Number: [id number] Date of Birth/Sex: Treating RN: 08-Nov-1950 (69 y.o. [name] Primary Care Provider: Shawnie [name] Other Clinician: Referring Provider: Treating Provider/Extender:[name], [name], [name] in Treatment: 208 Active Problems Location of Pain Severity and Description of Pain Patient Has Paino No Site Locations Pain Management and Medication Current Pain Management: Electronic Signature(s) Signed: 01/19/2020 4:42:28 PM By: Baruch Gouty RN, BSN Signed: 02/16/2020 9:21:08 AM By: Sandre Kitty Entered By: Sandre Kitty on 01/19/2020 10:55:36 -------------------------------------------------------------------------------- Patient/Caregiver Education Details Patient Name: Date of Service: [name] 3/17/2021andnbsp10:30 AM Medical Record [id number] Patient Account Number: [id number] Date of Birth/Gender: Treating RN: 09-09-1951 (69 y.o. [name] Primary Care Physician: [name] Other Clinician: Referring Physician: Treating Physician/Extender:[name], [name], [name] in Treatment: 208 Education Assessment Education Provided To: Patient Education Topics Provided Venous: Methods: Explain/Verbal Responses: Reinforcements needed, State content correctly Wound/Skin Impairment: Methods: Explain/Verbal Responses: Reinforcements needed, State content correctly Electronic Signature(s) Signed: 01/19/2020 4:42:28 PM By: Baruch Gouty RN, BSN Entered By: Baruch Gouty on 01/19/2020 10:40:55 -------------------------------------------------------------------------------- Wound Assessment Details Patient Name: Date of Service: [name] 01/19/2020 10:30 AM Medical Record [id number] Patient Account Number: [id number] Date of Birth/Sex: Treating RN: [birth year] (69 y.o. [name] Primary Care Provider: Shawnie [name] Other Clinician: Referring Provider: Treating Provider/Extender:[name], [name], [name] in Treatment: 208 Wound Status Wound Number: [id number] Primary Diabetic Wound/Ulcer of the Lower Etiology: Extremity Wound Location: Left Toe Fourth Wound Status: Healed - Epithelialized Wounding Event: Gradually Appeared Date Acquired: 01/05/2020 [name] Of Treatment: 2 Clustered Wound: No Photos Photo Uploaded By: Mikeal Hawthorne on 01/20/2020 10:00:36 Wound Measurements Length: (cm) 0 % Reduction Width: (cm) 0 % Reduction Depth: (cm) 0 Area: (cm) 0 Volume: (cm) 0 Wound Description Classification: Grade 1 in Area: 100% in Volume: 100% Electronic Signature(s) Signed: 01/19/2020 4:31:34 PM By: Deon Pilling Entered By: Deon Pilling on 01/19/2020  11:27:50 -------------------------------------------------------------------------------- Wound Assessment Details Patient Name: Date of Service: [name], [name] 01/19/2020 10:30 AM Medical Record [id number] Patient Account Number: [id number] Date of Birth/Sex: Treating RN: 03/28/1951 (69 y.o. [name] Primary Care Provider: Shawnie [name] Other Clinician: Referring Provider: Treating Provider/Extender:[name], [name], [name] in Treatment: 208 Wound Status Wound Number: [id number] Primary Lymphedema Etiology: Wound Location: Right Toe Great Wound Open Wounding Event: Gradually Appeared Status: Date Acquired: 01/19/2020 Comorbid Chronic sinus problems/congestion, [name] Of Treatment: 0 History: Arrhythmia, Hypertension, Peripheral Arterial Clustered Wound: No Disease, Type II Diabetes, History of Burn, Gout, Confinement Anxiety Photos Photo Uploaded By: Mikeal Hawthorne on 01/20/2020 10:00:36 Wound Measurements Length: (cm) 1.5 % Reduction Width: (cm) 0.5 % Reduction Depth: (cm) 0.1 Epitheliali Area: (cm) 0.589 Tunneling: Volume: (cm) 0.059 Underminin Wound Description Classification: Full Thickness Without Exposed Support Foul O Structures Slough Wound Distinct, outline attached Margin: Exudate Medium Amount: Exudate Serosanguineous Type: Exudate red, brown Color: Wound Bed Granulation Amount: Large (67-100%) Granulation Quality: Red, Pink Fascia Necrotic Amount: None Present (0%) Fat Lay Tendon Muscle Joint E Bone Ex dor After Cleansing: No /Fibrino No Exposed Structure Exposed: No er (Subcutaneous Tissue) Exposed: No Exposed: No Exposed: No xposed: No posed: No in Area: 0% in Volume: 0% zation:  Small (1-33%) No g: No Electronic Signature(s) Signed: 01/19/2020 4:31:34 PM By: Deon Pilling Signed: 01/19/2020 4:42:28 PM By: Baruch Gouty RN, BSN Entered By: Deon Pilling on 01/19/2020  11:29:17 -------------------------------------------------------------------------------- Vitals Details Patient Name: Date of Service: [name]. 01/19/2020 10:30 AM Medical Record [id number] Patient Account Number: [id number] Date of Birth/Sex: Treating RN: 1951-07-02 (69 y.o. [name] Primary Care Provider: Shawnie [name] Other Clinician: Referring Provider: Treating Provider/Extender:[name], [name], [name] in Treatment: 208 Vital Signs Time Taken: 10:52 Temperature (F): 97.9 Height (in): 70 Pulse (bpm): 69 Weight (lbs): 380.2 Respiratory Rate (breaths/min): 20 Body Mass Index (BMI): 54.5 Blood Pressure (mmHg): 146/67 Reference Range: 80 - 120 mg / dl Electronic Signature(s) Signed: 02/16/2020 9:21:08 AM By: Sandre Kitty Entered By: Sandre Kitty on 01/19/2020 10:54:53

## 2020-02-16 NOTE — Progress Notes (Signed)
JAIYDEN, LAUR ([id number]) Visit Report for 12/15/2019 Arrival Information Details Patient Name: Date of Service: SHELLIE, GOETTL 12/15/2019 10:15 AM Medical Record [id number] Patient Account Number: [id number] Date of Birth/Sex: Treating RN: 11/25/1950 (69 y.o. [name] Primary Care Kalynne Womac: [name] Other Clinician: Referring Geriann Lafont: Treating Halbert Jesson/Extender:[name], Dema Severin, [name] in Treatment: 203 Visit Information History Since Last Visit Walker Added or deleted any medications: No Patient Arrived: Any new allergies or adverse reactions: No Arrival Time: 10:32 Had a fall or experienced change in No Accompanied By: self activities of daily living that may affect Transfer Assistance: None risk of falls: Patient Identification Verified: Yes Signs or symptoms of abuse/neglect since last No Secondary Verification Process Completed: Yes visito Patient Requires Transmission-Based No Hospitalized since last visit: No Precautions: Implantable device outside of the clinic excluding No Patient Has Alerts: Yes cellular tissue based products placed in the center since last visit: Has Dressing in Place as Prescribed: Yes Pain Present Now: No Electronic Signature(s) Signed: 02/16/2020 9:23:26 AM By: Sandre Kitty Entered By: Sandre Kitty on 12/15/2019 10:34:54 -------------------------------------------------------------------------------- Compression Therapy Details Patient Name: Date of Service: TYQUEZ, HOLLIBAUGH 12/15/2019 10:15 AM Medical Record [id number] Patient Account Number: [id number] Date of Birth/Sex: Treating RN: 1951-03-21 (69 y.o. [name] Primary Care Nykeem Citro: [name] Other Clinician: Referring Saloma Cadena: Treating Adrain Nesbit/Extender:[name], Dema Severin, [name] in Treatment: 203 Compression Therapy Performed for Wound Wound #156 Left,Dorsal Foot Assessment: Performed By: Clinician  [name], RN Compression Type: Four Layer Post Procedure Diagnosis Same as Pre-procedure Electronic Signature(s) Signed: 12/15/2019 5:39:41 PM By: Baruch Gouty RN, BSN Entered By: Baruch Gouty on 12/15/2019 11:03:47 -------------------------------------------------------------------------------- Compression Therapy Details Patient Name: Date of Service: PARAS, KREIDER 12/15/2019 10:15 AM Medical Record [id number] Patient Account Number: [id number] Date of Birth/Sex: Treating RN: January 08, 1951 (69 y.o. [name] Primary Care Tannar Broker: [name] Other Clinician: Referring Kael Keetch: Treating Oria Klimas/Extender:[name], Dema Severin, [name] in Treatment: 203 Compression Therapy Performed for Wound NonWound Condition Lymphedema - Right Leg Assessment: Performed By: Clinician [name], RN Compression Type: [name] Post Procedure Diagnosis Same as Pre-procedure Electronic Signature(s) Signed: 12/15/2019 5:39:41 PM By: Baruch Gouty RN, BSN Entered By: Baruch Gouty on 12/15/2019 11:04:16 -------------------------------------------------------------------------------- Encounter Discharge Information Details Patient Name: Date of Service: KAYODE, PETION 12/15/2019 10:15 AM Medical Record [id number] Patient Account Number: [id number] Date of Birth/Sex: Treating RN: [birth year] (69 y.o. [name] Primary Care Tanice Petre: [name] Other Clinician: Referring Charnel Giles: Treating Kelcie Currie/Extender:[name], Dema Severin, [name] in Treatment: 203 Encounter Discharge Information Items Discharge Condition: Stable Ambulatory Status: Walker Discharge Destination: Home Transportation: Private Auto Accompanied By: self Schedule Follow-up Appointment: Yes Clinical Summary of Care: Electronic Signature(s) Signed: 12/15/2019 5:06:10 PM By: Deon Pilling Entered By: Deon Pilling on 12/15/2019  11:59:12 -------------------------------------------------------------------------------- Lower Extremity Assessment Details Patient Name: Date of Service: RAGNAR, WAAS 12/15/2019 10:15 AM Medical Record [id number] Patient Account Number: [id number] Date of Birth/Sex: Treating RN: Sep 17, 1951 (69 y.o. [name] Primary Care Yitzchok Carriger: [name] Other Clinician: Referring Renate Danh: Treating Namiyah Grantham/Extender:[name], Dema Severin, [name] in Treatment: 203 Edema Assessment Assessed: [Left: Yes] [Right: Yes] Edema: [Left: Yes] [Right: Yes] Calf Left: Right: Point of Measurement: 31 cm From Medial Instep 42 cm 40 cm Ankle Left: Right: Point of Measurement: 12 cm From Medial Instep 27.5 cm 30 cm Electronic Signature(s) Signed: 12/15/2019 5:06:10 PM By: Deon Pilling Entered By: Deon Pilling on 12/15/2019 10:44:02 -------------------------------------------------------------------------------- Multi-Disciplinary Care Plan Details Patient Name: Date of Service: Kristopher Glee,  Langston J. 12/15/2019 10:15 AM Medical Record [id number] Patient Account Number: [id number] Date of Birth/Sex: Treating RN: 1951-08-23 (70 y.o. [name] Primary Care Dorinda Stehr: [name] Other Clinician: Referring Josue Kass: Treating Anayia Eugene/Extender:[name], Dema Severin, [name] in Treatment: 203 Active Inactive Venous Leg Ulcer Nursing Diagnoses: Actual venous Insuffiency (use after diagnosis is confirmed) Goals: Patient will maintain optimal edema control Date Initiated: 09/10/2016 Target Resolution Date: 12/29/2019 Goal Status: Active Verify adequate tissue perfusion prior to therapeutic compression application Date Initiated: 09/10/2016 Date Inactivated: 11/28/2016 Goal Status: Met Interventions: Assess peripheral edema status every visit. Compression as ordered Provide education on venous insufficiency Notes: edema not contolled above wraps, pt not using  lymoh pumps regularly Wound/Skin Impairment Nursing Diagnoses: Impaired tissue integrity Goals: Patient/caregiver will verbalize understanding of skin care regimen Date Initiated: 09/10/2016 Target Resolution Date: 12/29/2019 Goal Status: Active Interventions: Assess patient/caregiver ability to perform ulcer/skin care regimen upon admission and as needed Assess ulceration(s) every visit Provide education on ulcer and skin care Notes: Electronic Signature(s) Signed: 12/15/2019 5:39:41 PM By: Baruch Gouty RN, BSN Entered By: Baruch Gouty on 12/15/2019 11:02:42 -------------------------------------------------------------------------------- Pain Assessment Details Patient Name: Date of Service: [name] 12/15/2019 10:15 AM Medical Record [id number] Patient Account Number: [id number] Date of Birth/Sex: Treating RN: 1951-10-03 (69 y.o. [name] Primary Care Yosiah Jasmin: [name] Other Clinician: Referring Junie Engram: Treating Jeison Delpilar/Extender:[name], Dema Severin, [name] in Treatment: 203 Active Problems Location of Pain Severity and Description of Pain Patient Has Paino No Patient Has Paino No Site Locations Pain Management and Medication Current Pain Management: Electronic Signature(s) Signed: 12/15/2019 5:39:41 PM By: Baruch Gouty RN, BSN Signed: 02/16/2020 9:23:26 AM By: Sandre Kitty Entered By: Sandre Kitty on 12/15/2019 10:35:26 -------------------------------------------------------------------------------- Patient/Caregiver Education Details Patient Name: Date of Service: [name] 2/10/2021andnbsp10:15 AM Medical Record [id number] Patient Account Number: [id number] Date of Birth/Gender: Treating RN: Oct 02, 1951 (69 y.o. [name] Primary Care Physician: [name] Other Clinician: Referring Physician: Treating Physician/Extender:[name], Dema Severin, [name] in Treatment:  203 Education Assessment Education Provided To: Patient Education Topics Provided Venous: Methods: Explain/Verbal Responses: Reinforcements needed, State content correctly Wound/Skin Impairment: Methods: Explain/Verbal Responses: Reinforcements needed, State content correctly Electronic Signature(s) Signed: 12/15/2019 5:39:41 PM By: Baruch Gouty RN, BSN Entered By: Baruch Gouty on 12/15/2019 11:03:08 -------------------------------------------------------------------------------- Wound Assessment Details Patient Name: Date of Service: KORE, MADLOCK 12/15/2019 10:15 AM Medical Record [id number] Patient Account Number: [id number] Date of Birth/Sex: Treating RN: [birth year] (69 y.o. [name] Primary Care Lorann Tani: [name] Other Clinician: Referring Doniel Maiello: Treating Evaristo Tsuda/Extender:[name], Dema Severin, [name] in Treatment: 203 Wound Status Wound Number: [id number] Primary Lymphedema Etiology: Wound Location: Right Upper Leg - Medial Wound Open Wounding Event: Gradually Appeared Status: Date Acquired: 09/12/2019 Comorbid Chronic sinus problems/congestion, Weeks Of Treatment: 13 History: Arrhythmia, Hypertension, Peripheral Arterial Clustered Wound: Yes Disease, Type II Diabetes, History of Burn, Gout, Confinement Anxiety Photos Wound Measurements Length: (cm) 6 % Reduction in A Width: (cm) 2.5 % Reduction in V Depth: (cm) 0.1 Epithelializatio Clustered Quantity: 2 Tunneling: Area: (cm) 11.781 Undermining: Volume: (cm) 1.178 Wound Description Classification: Partial Thickness Foul Odor After Wound Margin: Flat and Intact Slough/Fibrino Exudate Amount: Medium Exudate Type: Serosanguineous Exudate Color: red, brown Wound Bed Granulation Amount: Large (67-100%) Granulation Quality: Red Fascia Exposed: Necrotic Amount: None Present (0%) Fat Layer (Subcu Tendon Exposed: Muscle Exposed: Joint Exposed: Bone Expos Cleansing:  No No Exposed Structure No taneous Tissue) Exposed: No No No No ed: No rea: -900.1% olume: -898.3% n: Medium (  34-66%) No No Electronic Signature(s) Signed: 12/16/2019 4:30:47 PM By: Mikeal Hawthorne EMT/HBOT Signed: 12/17/2019 5:30:09 PM By: Baruch Gouty RN, BSN Previous Signature: 12/15/2019 5:06:10 PM Version By: Deon Pilling Entered By: Mikeal Hawthorne on 12/16/2019 14:21:58 -------------------------------------------------------------------------------- Wound Assessment Details Patient Name: Date of Service: OSMANY, AZER 12/15/2019 10:15 AM Medical Record [id number] Patient Account Number: [id number] Date of Birth/Sex: Treating RN: 05-24-1951 (69 y.o. [name] Primary Care Khalifa Knecht: [name] Other Clinician: Referring Wilbur Oakland: Treating Laurian Edrington/Extender:[name], Dema Severin, [name] in Treatment: 203 Wound Status Wound Number: [id number] Primary Diabetic Wound/Ulcer of the Lower Extremity Etiology: Wound Location: Left Foot - Dorsal Wound Open Wounding Event: Blister Status: Date Acquired: 11/24/2019 Comorbid Chronic sinus problems/congestion, Weeks Of Treatment: 3 History: Arrhythmia, Hypertension, Peripheral Arterial Clustered Wound: No Disease, Type II Diabetes, History of Burn, Gout, Confinement Anxiety Photos Wound Measurements Length: (cm) 0.6 % Reductio Width: (cm) 0.7 % Reductio Depth: (cm) 0.1 Epithelial Area: (cm) 0.33 Tunneling Volume: (cm) 0.033 Undermini Wound Description Classification: Grade 1 Wound Margin: Distinct, outline attached Exudate Amount: Medium Exudate Type: Serosanguineous Exudate Color: red, brown Wound Bed Granulation Amount: Large (67-100%) Granulation Quality: Red, Pink Necrotic Amount: Small (1-33%) Necrotic Quality: Adherent Slough Foul Odor After Cleansing: No Slough/Fibrino Yes Exposed Structure Fascia Exposed: No Fat Layer (Subcutaneous Tissue) Exposed: No Tendon Exposed: No Muscle  Exposed: No Joint Exposed: No Bone Exposed: No n in Area: 70% n in Volume: 70% ization: Large (67-100%) : No ng: No Electronic Signature(s) Signed: 12/16/2019 4:30:47 PM By: Mikeal Hawthorne EMT/HBOT Signed: 12/17/2019 5:30:09 PM By: Baruch Gouty RN, BSN Previous Signature: 12/15/2019 5:06:10 PM Version By: Deon Pilling Entered By: Mikeal Hawthorne on 12/16/2019 14:23:13 -------------------------------------------------------------------------------- Vitals Details Patient Name: Date of Service: DARRICK, GREENLAW 12/15/2019 10:15 AM Medical Record [id number] Patient Account Number: [id number] Date of Birth/Sex: Treating RN: 1951-03-17 (69 y.o. [name] Primary Care Bairon Klemann: [name] Other Clinician: Referring Tanea Moga: Treating Hermie Reagor/Extender:[name], Dema Severin, [name] in Treatment: 203 Vital Signs Time Taken: 10:34 Temperature (F): 97.9 Height (in): 70 Pulse (bpm): 62 Weight (lbs): 380.2 Respiratory Rate (breaths/min): 20 Body Mass Index (BMI): 54.5 Blood Pressure (mmHg): 127/64 Capillary Blood Glucose (mg/dl): 126 Reference Range: 80 - 120 mg / dl Electronic Signature(s) Signed: 02/16/2020 9:23:26 AM By: Sandre Kitty Entered By: Sandre Kitty on 12/15/2019 10:35:16

## 2020-02-16 NOTE — Progress Notes (Signed)
EAIN, MULLENDORE ([id number]) Visit Report for 02/16/2020 Arrival Information Details Patient Name: Date of Service: [name], [name] 02/16/2020 10:45 AM Medical Record [id number] Patient Account Number: [id number] Date of Birth/Sex: Treating RN: August 06, 1951 (69 y.o. [name] Primary Care Kailer Heindel: [name] Other Clinician: Referring Jaysin Gayler: Treating Sevanna Ballengee/Extender:[name], Dema Severin, [name] in Treatment: 212 Visit Information History Since Last Visit Walker Added or deleted any medications: No Patient Arrived: Any new allergies or adverse reactions: No Arrival Time: 10:49 Had a fall or experienced change in No Accompanied By: self activities of daily living that may affect Transfer Assistance: None risk of falls: Patient Identification Verified: Yes Signs or symptoms of abuse/neglect since last No Secondary Verification Process Completed: Yes visito Patient Requires Transmission-Based No Hospitalized since last visit: No Precautions: Implantable device outside of the clinic excluding No Patient Has Alerts: Yes cellular tissue based products placed in the center since last visit: Has Dressing in Place as Prescribed: Yes Pain Present Now: No Electronic Signature(s) Signed: 02/16/2020 2:20:25 PM By: Sandre Kitty Entered By: Sandre Kitty on 02/16/2020 10:50:11 -------------------------------------------------------------------------------- Compression Therapy Details Patient Name: Date of Service: [name], [name] 02/16/2020 10:45 AM Medical Record [id number] Patient Account Number: [id number] Date of Birth/Sex: Treating RN: [birth year] (69 y.o. [name] Primary Care Brystal Kildow: [name] Other Clinician: Referring Moana Munford: Treating Nedra Mcinnis/Extender:[name], Dema Severin, [name] in Treatment: 212 Compression Therapy Performed for Wound NonWound Condition Lymphedema - Right Leg Assessment: Performed  By: Clinician Deon Pilling, RN Compression Type: [name] Post Procedure Diagnosis Same as Pre-procedure Electronic Signature(s) Signed: 02/16/2020 5:43:24 PM By: Baruch Gouty RN, BSN Entered By: Baruch Gouty on 02/16/2020 11:57:05 -------------------------------------------------------------------------------- Compression Therapy Details Patient Name: Date of Service: [name], [name] 02/16/2020 10:45 AM Medical Record [id number] Patient Account Number: [id number] Date of Birth/Sex: Treating RN: 1951/10/13 (69 y.o. [name] Primary Care Schae Cando: [name] Other Clinician: Referring Kevin Space: Treating Ellianna Ruest/Extender:[name], Dema Severin, [name] in Treatment: 212 Compression Therapy Performed for Wound NonWound Condition Lymphedema - Left Leg Assessment: Performed By: Clinician Deon Pilling, RN Compression Type: Four Layer Post Procedure Diagnosis Same as Pre-procedure Electronic Signature(s) Signed: 02/16/2020 5:43:24 PM By: Baruch Gouty RN, BSN Entered By: Baruch Gouty on 02/16/2020 11:57:36 -------------------------------------------------------------------------------- Encounter Discharge Information Details Patient Name: Date of Service: [name], [name] 02/16/2020 10:45 AM Medical Record [id number] Patient Account Number: [id number] Date of Birth/Sex: Treating RN: 1951-04-21 (69 y.o. [name] Primary Care Tiasia Weberg: [name] Other Clinician: Referring Jabron Weese: Treating Denelle Capurro/Extender:[name], Dema Severin, [name] in Treatment: 212 Encounter Discharge Information Items Discharge Condition: Stable Ambulatory Status: Walker Discharge Destination: Home Transportation: Private Auto Accompanied By: self Schedule Follow-up Appointment: Yes Clinical Summary of Care: Patient Declined Electronic Signature(s) Signed: 02/16/2020 5:43:24 PM By: Baruch Gouty RN, BSN Entered By: Baruch Gouty on  02/16/2020 12:20:49 -------------------------------------------------------------------------------- Lower Extremity Assessment Details Patient Name: Date of Service: [name], [name] 02/16/2020 10:45 AM Medical Record [id number] Patient Account Number: [id number] Date of Birth/Sex: Treating RN: [birth year] (69 y.o. [name] Primary Care Maddisyn Hegwood: [name] Other Clinician: Referring Quanita Barona: Treating Wonda Goodgame/Extender:[name], Dema Severin, [name] in Treatment: 212 Edema Assessment Assessed: [Left: No] [Right: No] Edema: [Left: Yes] [Right: Yes] Calf Left: Right: Point of Measurement: 31 cm From Medial Instep 37.5 cm 36.5 cm Ankle Left: Right: Point of Measurement: 12 cm From Medial Instep 25 cm 25 cm Vascular Assessment Pulses: Dorsalis Pedis Palpable: [Left:Yes] [Right:Yes] Electronic Signature(s) Signed: 02/16/2020 5:43:43 PM By: Levan Hurst RN, BSN Entered By:  Levan Hurst on 02/16/2020 11:18:02 -------------------------------------------------------------------------------- Multi-Disciplinary Care Plan Details Patient Name: Date of Service: [name], [name] 02/16/2020 10:45 AM Medical Record [id number] Patient Account Number: [id number] Date of Birth/Sex: Treating RN: 12-01-1950 (69 y.o. [name] Primary Care Roark Rufo: [name] Other Clinician: Referring Delorise Hunkele: Treating Robert Sunga/Extender:[name], Dema Severin, [name] in Treatment: 212 Active Inactive Venous Leg Ulcer Nursing Diagnoses: Actual venous Insuffiency (use after diagnosis is confirmed) Goals: Patient will maintain optimal edema control Date Initiated: 09/10/2016 Target Resolution Date: 02/23/2020 Goal Status: Active Verify adequate tissue perfusion prior to therapeutic compression application Date Initiated: 09/10/2016 Date Inactivated: 11/28/2016 Goal Status: Met Interventions: Assess peripheral edema status every visit. Compression as  ordered Provide education on venous insufficiency Notes: edema not contolled above wraps, pt not using lymoh pumps regularly Wound/Skin Impairment Nursing Diagnoses: Impaired tissue integrity Goals: Patient/caregiver will verbalize understanding of skin care regimen Date Initiated: 09/10/2016 Target Resolution Date: 02/23/2020 Goal Status: Active Interventions: Assess patient/caregiver ability to perform ulcer/skin care regimen upon admission and as needed Assess ulceration(s) every visit Provide education on ulcer and skin care Notes: Electronic Signature(s) Signed: 02/16/2020 5:43:24 PM By: Baruch Gouty RN, BSN Entered By: Baruch Gouty on 02/16/2020 11:55:02 -------------------------------------------------------------------------------- Pain Assessment Details Patient Name: Date of Service: [name] 02/16/2020 10:45 AM Medical Record [id number] Patient Account Number: [id number] Date of Birth/Sex: Treating RN: 09-07-1951 (69 y.o. [name] Primary Care Tariah Transue: [name] Other Clinician: Referring Zeinab Rodwell: Treating Isis Costanza/Extender:[name], Dema Severin, [name] in Treatment: 212 Active Problems Location of Pain Severity and Description of Pain Patient Has Paino No Site Locations Pain Management and Medication Current Pain Management: Electronic Signature(s) Signed: 02/16/2020 2:20:25 PM By: Sandre Kitty Signed: 02/16/2020 5:43:24 PM By: Baruch Gouty RN, BSN Entered By: Sandre Kitty on 02/16/2020 10:53:58 -------------------------------------------------------------------------------- Patient/Caregiver Education Details Patient Name: Date of Service: [name] 4/14/2021andnbsp10:45 AM Medical Record [id number] Patient Account Number: [id number] Date of Birth/Gender: Treating RN: 12/01/1950 (69 y.o. [name] Primary Care Physician: [name] Other Clinician: Referring Physician: Treating  Physician/Extender:[name], Dema Severin, [name] in Treatment: 212 Education Assessment Education Provided To: Patient Education Topics Provided Venous: Methods: Explain/Verbal Responses: Reinforcements needed, State content correctly Wound/Skin Impairment: Methods: Explain/Verbal Responses: Reinforcements needed, State content correctly Electronic Signature(s) Signed: 02/16/2020 5:43:24 PM By: Baruch Gouty RN, BSN Entered By: Baruch Gouty on 02/16/2020 11:56:23 -------------------------------------------------------------------------------- Wound Assessment Details Patient Name: Date of Service: [name] 02/16/2020 10:45 AM Medical Record [id number] Patient Account Number: [id number] Date of Birth/Sex: Treating RN: 04-16-1951 (69 y.o. [name] Primary Care Marcela Alatorre: [name] Other Clinician: Referring Jacoria Keiffer: Treating Harshal Sirmon/Extender:[name], Dema Severin, [name] in Treatment: 212 Wound Status Wound Number: [id number] Primary Lymphedema Etiology: Wound Location: Right Toe Great Wound Healed - Epithelialized Wounding Event: Gradually Appeared Status: Date Acquired: 01/19/2020 Comorbid Chronic sinus problems/congestion, Weeks Of Treatment: 4 History: Arrhythmia, Hypertension, Peripheral Arterial Clustered Wound: No Disease, Type II Diabetes, History of Burn, Gout, Confinement Anxiety Wound Measurements Length: (cm) 0 % Reduc Width: (cm) 0 % Reduc Depth: (cm) 0 Epithel Area: (cm) 0 Tunnel Volume: (cm) 0 Underm Wound Description Full Thickness Without Exposed Support Classification: Structures Wound Distinct, outline attached Margin: Exudate None Present Amount: Wound Bed Granulation Amount: None Present (0%) Necrotic Amount: None Present (0%) Foul Odor After Cleansing: No Slough/Fibrino No Exposed Structure Fascia Exposed: No Fat Layer (Subcutaneous Tissue) Exposed: No Tendon Exposed: No Muscle Exposed:  No Joint Exposed: No Bone Exposed: No tion in Area: 100% tion in Volume: 100%  ialization: Large (67-100%) ing: No ining: No Electronic Signature(s) Signed: 02/16/2020 5:43:24 PM By: Baruch Gouty RN, BSN Signed: 02/16/2020 5:43:43 PM By: Levan Hurst RN, BSN Entered By: Levan Hurst on 02/16/2020 11:18:54 -------------------------------------------------------------------------------- Wound Assessment Details Patient Name: Date of Service: [name], [name] 02/16/2020 10:45 AM Medical Record [id number] Patient Account Number: [id number] Date of Birth/Sex: Treating RN: 10-Sep-1951 (69 y.o. [name] Primary Care Angelin Cutrone: [name] Other Clinician: Referring Yaphet Smethurst: Treating Syncere Kaminski/Extender:[name], Dema Severin, [name] in Treatment: 212 Wound Status Wound Number: [id number] Primary Lymphedema Etiology: Wound Location: Right Toe Second Wound Open Wounding Event: Gradually Appeared Status: Date Acquired: 01/25/2020 Comorbid Chronic sinus problems/congestion, Weeks Of Treatment: 3 History: Arrhythmia, Hypertension, Peripheral Arterial Clustered Wound: No Disease, Type II Diabetes, History of Burn, Gout, Confinement Anxiety Wound Measurements Length: (cm) 0.4 Width: (cm) 1.3 Depth: (cm) 0.1 Area: (cm) 0.408 Volume: (cm) 0.041 Wound Description Classification: Partial Thickness Wound Margin: Distinct, outline attached Exudate Amount: Small Exudate Type: Serous Exudate Color: amber Wound Bed Granulation Amount: Large (67-100%) Granulation Quality: Pink Necrotic Amount: None Present (0%) After Cleansing: No brino No Exposed Structure osed: No (Subcutaneous Tissue) Exposed: No osed: No osed: No sed: No ed: No % Reduction in Area: -189.4% % Reduction in Volume: -192.9% Epithelialization: Medium (34-66%) Tunneling: No Undermining: No Foul Odor Slough/Fi Fascia Exp Fat Layer Tendon Exp Muscle Exp Joint Expo Bone  Expos Treatment Notes Wound #162 (Right Toe Second) 3. Primary Dressing Applied Calcium Alginate Ag 4. Secondary Dressing Dry Gauze Roll Gauze Electronic Signature(s) Signed: 02/16/2020 5:43:24 PM By: Baruch Gouty RN, BSN Signed: 02/16/2020 5:43:43 PM By: Levan Hurst RN, BSN Entered By: Levan Hurst on 02/16/2020 11:19:36 -------------------------------------------------------------------------------- Wound Assessment Details Patient Name: Date of Service: [name], [name] 02/16/2020 10:45 AM Medical Record [id number] Patient Account Number: [id number] Date of Birth/Sex: Treating RN: April 27, 1951 (69 y.o. [name] Primary Care Ayodele Sangalang: [name] Other Clinician: Referring Keigo Whalley: Treating Lanise Mergen/Extender:[name], Dema Severin, [name] in Treatment: 212 Wound Status Wound Number: [id number] Primary Lymphedema Etiology: Wound Location: Left, Medial Lower Leg Wound Healed - Epithelialized Wounding Event: Blister Status: Date Acquired: 01/20/2020 Comorbid Chronic sinus problems/congestion, Weeks Of Treatment: 3 History: Arrhythmia, Hypertension, Peripheral Arterial Clustered Wound: No Disease, Type II Diabetes, History of Burn, Gout, Confinement Anxiety Wound Measurements Length: (cm) 0 % Reduc Width: (cm) 0 % Reduc Depth: (cm) 0 Epithel Area: (cm) 0 Tunnel Volume: (cm) 0 Underm Wound Description Full Thickness Without Exposed Support Classification: Structures Exudate None Present Amount: Wound Bed Granulation Amount: None Present (0%) Necrotic Amount: None Present (0%) Foul Odor After Cleansing: No Slough/Fibrino No Exposed Structure Fascia Exposed: No Fat Layer (Subcutaneous Tissue) Exposed: No Tendon Exposed: No Muscle Exposed: No Joint Exposed: No Bone Exposed: No tion in Area: 100% tion in Volume: 100% ialization: Large (67-100%) ing: No ining: No Electronic Signature(s) Signed: 02/16/2020 5:43:24 PM By: Baruch Gouty  RN, BSN Signed: 02/16/2020 5:43:43 PM By: Levan Hurst RN, BSN Entered By: Levan Hurst on 02/16/2020 11:20:38 -------------------------------------------------------------------------------- Wound Assessment Details Patient Name: Date of Service: [name], [name] 02/16/2020 10:45 AM Medical Record [id number] Patient Account Number: [id number] Date of Birth/Sex: Treating RN: [birth year] (69 y.o. [name] Primary Care Pebble Botkin: [name] Other Clinician: Referring Andreea Arca: Treating Venia Riveron/Extender:[name], Dema Severin, [name] in Treatment: 212 Wound Status Wound Number: [id number] Primary Lymphedema Etiology: Wound Location: Right, Medial Lower Leg Wound Healed - Epithelialized Wounding Event: Blister Status: Date Acquired: 02/02/2020 Comorbid Chronic sinus problems/congestion, Weeks Of Treatment: 2 History: Arrhythmia, Hypertension,  Peripheral Arterial Clustered Wound: No Disease, Type II Diabetes, History of Burn, Gout, Confinement Anxiety Wound Measurements Length: (cm) 0 % Reduc Width: (cm) 0 % Reduc Depth: (cm) 0 Epithel Area: (cm) 0 Tunnel Volume: (cm) 0 Underm Wound Description Classification: Full Thickness Without Exposed Support Structures Wound Flat and Intact Margin: Exudate None Present Amount: Wound Bed Granulation Amount: None Present (0%) Necrotic Amount: None Present (0%) Foul Odor After Cleansing: No Slough/Fibrino No Exposed Structure Fascia Exposed: No Fat Layer (Subcutaneous Tissue) Exposed: No Tendon Exposed: No Muscle Exposed: No Joint Exposed: No Bone Exposed: No tion in Area: 100% tion in Volume: 100% ialization: Large (67-100%) ing: No ining: No Electronic Signature(s) Signed: 02/16/2020 5:43:24 PM By: Baruch Gouty RN, BSN Signed: 02/16/2020 5:43:43 PM By: Levan Hurst RN, BSN Entered By: Levan Hurst on 02/16/2020  11:21:54 -------------------------------------------------------------------------------- Wound Assessment Details Patient Name: Date of Service: [name], [name] 02/16/2020 10:45 AM Medical Record [id number] Patient Account Number: [id number] Date of Birth/Sex: Treating RN: 07-Dec-1950 (69 y.o. [name] Primary Care Lataysha Vohra: [name] Other Clinician: Referring Elyshia Kumagai: Treating Zanae Kuehnle/Extender:[name], Dema Severin, [name] in Treatment: 212 Wound Status Wound Number: [id number] Primary Lymphedema Etiology: Wound Location: Left Toe Great Wound Open Wounding Event: Gradually Appeared Status: Date Acquired: 02/02/2020 Comorbid Chronic sinus problems/congestion, Weeks Of Treatment: 2 History: Arrhythmia, Hypertension, Peripheral Arterial Clustered Wound: No Disease, Type II Diabetes, History of Burn, Gout, Confinement Anxiety Wound Measurements Length: (cm) 0.9 % Reduc Width: (cm) 1.1 % Reduc Depth: (cm) 0.1 Epithel Area: (cm) 0.778 Tunnel Volume: (cm) 0.078 Underm Wound Description Classification: Full Thickness Without Exposed Support Structures Wound Flat and Intact Margin: Exudate Medium Amount: Exudate Serous Type: Exudate amber Color: Wound Bed Granulation Amount: Large (67-100%) Granulation Quality: Pink Necrotic Amount: None Present (0%) Foul Odor After Cleansing: No Slough/Fibrino No Exposed Structure Fascia Exposed: No Fat Layer (Subcutaneous Tissue) Exposed: No Tendon Exposed: No Muscle Exposed: No Joint Exposed: No Bone Exposed: No tion in Area: -76.8% tion in Volume: -77.3% ialization: Medium (34-66%) ing: No ining: No Treatment Notes Wound #165 (Left Toe Great) 3. Primary Dressing Applied Calcium Alginate Ag 4. Secondary Dressing Dry Gauze Roll Gauze Electronic Signature(s) Signed: 02/16/2020 5:43:24 PM By: Baruch Gouty RN, BSN Signed: 02/16/2020 5:43:43 PM By: Levan Hurst RN, BSN Entered By: Levan Hurst on 02/16/2020 11:22:33 -------------------------------------------------------------------------------- Wound Assessment Details Patient Name: Date of Service: [name], OLDAKER 02/16/2020 10:45 AM Medical Record [id number] Patient Account Number: [id number] Date of Birth/Sex: Treating RN: 01-05-1951 (69 y.o. [name] Primary Care Lovelace Cerveny: [name] Other Clinician: Referring Taron Conrey: Treating Unika Nazareno/Extender:[name], Dema Severin, [name] in Treatment: 212 Wound Status Wound Number: [id number] Primary Lymphedema Etiology: Wound Location: Left, Dorsal Foot Wound Healed - Epithelialized Wounding Event: Gradually Appeared Status: Date Acquired: 02/02/2020 Comorbid Chronic sinus problems/congestion, Weeks Of Treatment: 2 History: Arrhythmia, Hypertension, Peripheral Arterial Clustered Wound: No Disease, Type II Diabetes, History of Burn, Gout, Confinement Anxiety Wound Measurements Length: (cm) 0 % Reduc Width: (cm) 0 % Reduc Depth: (cm) 0 Epithel Area: (cm) 0 Tunnel Volume: (cm) 0 Underm Wound Description Classification: Full Thickness Without Exposed Support Structures Wound Flat and Intact Margin: Exudate None Present Amount: Wound Bed Granulation Amount: None Present (0%) Necrotic Amount: None Present (0%) Foul Odor After Cleansing: No Slough/Fibrino No Exposed Structure Fascia Exposed: No Fat Layer (Subcutaneous Tissue) Exposed: No Tendon Exposed: No Muscle Exposed: No Joint Exposed: No Bone Exposed: No tion in Area: 100% tion in Volume: 100% ialization: Large (67-100%) ing: No ining: No  Electronic Signature(s) Signed: 02/16/2020 5:43:24 PM By: Baruch Gouty RN, BSN Signed: 02/16/2020 5:43:43 PM By: Levan Hurst RN, BSN Entered By: Levan Hurst on 02/16/2020 11:23:10 -------------------------------------------------------------------------------- [place] Details Patient Name: Date of Service: MILBURN, FREENEY  02/16/2020 10:45 AM Medical Record [id number] Patient Account Number: [id number] Date of Birth/Sex: Treating RN: 10/24/1951 (69 y.o. [name] Primary Care Bailee Metter: [name] Other Clinician: Referring Kanita Delage: Treating Orel Cooler/Extender:[name], Dema Severin, [name] in Treatment: 212 Vital Signs Time Taken: 10:51 Temperature (F): 97.5 Height (in): 70 Pulse (bpm): 62 Weight (lbs): 380.2 Respiratory Rate (breaths/min): 18 Body Mass Index (BMI): 54.5 Blood Pressure (mmHg): 146/63 Reference Range: 80 - 120 mg / dl Electronic Signature(s) Signed: 02/16/2020 2:20:25 PM By: Sandre Kitty Entered By: Sandre Kitty on 02/16/2020 10:53:53

## 2020-02-16 NOTE — Progress Notes (Addendum)
[name] ([id number]) Visit Report for 02/16/2020 Chief Complaint Document Details Patient Name: Date of Service: [name], [name] 02/16/2020 10:45 AM Medical Record [id number] Patient Account Number: [id number] Date of Birth/Sex: Treating RN: [birth year] (69 y.o. [name] Primary Care Provider: Nicoletta Ba Other Clinician: Referring Provider: Treating Provider/Extender:[name], [name], [name] in Treatment: 212 Information Obtained from: Patient Chief Complaint patient is here for evaluation venous/lymphedema weeping Electronic Signature(s) Signed: 02/16/2020 11:12:37 AM By: Lenda Kelp PA-C Entered By: Lenda Kelp on 02/16/2020 11:12:36 -------------------------------------------------------------------------------- HPI Details Patient Name: Date of Service: [name], [name] 02/16/2020 10:45 AM Medical Record [id number] Patient Account Number: [id number] Date of Birth/Sex: Treating RN: 1951/04/02 (69 y.o. [name] Primary Care Provider: Nicoletta Ba Other Clinician: Referring Provider: Treating Provider/Extender:[name], [name], [name] in Treatment: 212 History of Present Illness HPI Description: Referred by PCP for consultation. Patient has long standing history of BLE venous stasis, no prior ulcerations. At beginning of month, developed cellulitis and weeping. Received IM Rocephin followed by Keflex and resolved. Wears compression stocking, appr 6 months old. Not sure strength. No present drainage. 01/22/16 this is a patient who is a type II diabetic on insulin. 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 He tells me he religiously wears pressure stockings of uncertain strength. He was here with weeping edema about 8 months ago but did not have an open wound. Roughly a month ago he had a reopening on his bilateral legs. He is been using bandages and Neosporin. He does not  complain of pain. 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 Last BUN/creatinine I have is from 11/20/15 at 13 and 1.0 respectively 01/29/16; patient arrives today having tolerated the Profore wrap. He brought in his stockings and these are 18 mmHg stockings he bought from [place]. The compression here is likely inadequate. He does not complain of pain or excessive drainage she has no systemic symptoms. The wound on the right looks improved as does the one on the left although one on the left is more substantial with still tissue at risk below the actual wound area on the bilateral posterior calf 02/05/16; patient arrives with poor edema control. He states that we did put a 4 layer compression on it last week. No weight appear 5 this. 02/12/16; the area on the posterior right Has healed. The left Has a substantial wound that has necrotic surface eschar that requires a debridement with a curette. 02/16/16;the patient called or a Nurse visit secondary to increased swelling. He had been in earlier in the week with his right leg healed. He was transitioned to is on pressure stocking on the right leg with the only open wound on the left, a substantial area on the left posterior calf. Note he has a history of severe lower extremity edema, he has a history of chronic atrial fibrillation but not heart failure per my notes but I'll need to research this. He is not complaining of chest pain shortness of breath or orthopnea. The intake nurse noted blisters on the previously closed right leg 02/19/16; this is the patient's regular visit day. I see him on Friday with escalating edema new wounds on the right leg and clear signs of at least right ventricular heart failure. I increased his Lasix to 40 twice a day. He is returning currently in follow-up. States he  is noticed a decrease in that the edema 02/26/16  patient's legs have much less edema. There is nothing really open on the right leg. The left leg has improved condition of the large superficial wound on the posterior left leg 03/04/16; edema control is very much better. The patient's right leg wounds have healed. On the left leg he continues to have severe venous inflammation on the posterior aspect of the left leg. There is no tenderness and I don't think any of this is cellulitis. 03/11/16; patient's right leg is married healed and he is in his own stocking. The patient's left leg has deteriorated somewhat. There is a lot of erythema around the wound on the posterior left leg. There is also a significant rim of erythema posteriorly just above where the wrap would've ended there is a new wound in this location and a lot of tenderness. Can't rule out cellulitis in this area. 03/15/16; patient's right leg remains healed and he is in his own stocking. The patient's left leg is much better than last review. His major wound on the posterior aspect of his left Is almost fully epithelialized. He has 3 small injuries from the wraps. Really. Erythema seems a lot better on antibiotics 03/18/16; right leg remains healed and he is in his own stocking. The patient's left leg is much better. The area on the posterior aspect of the left calf is fully epithelialized. His 3 small injuries which were wrap injuries on the left are improved only one seems still open his erythema has resolved 03/25/16; patient's right leg remains healed and he is in his own stocking. There is no open area today on the left leg posterior leg is completely closed up. His wrap injuries at the superior aspect of his leg are also resolved. He looks as though he has some irritation on the dorsal ankle but this is fully epithelialized without evidence of infection. 03/28/16; we discharged this patient on Monday. Transitioned him into his own stocking. There were problems almost immediately with  uncontrolled swelling weeping edema multiple some of which have opened. He does not feel systemically unwell in particular no chest pain no shortness of breath and he does not feel 04/08/16; the edema is under better control with the Profore light wrap but he still has pitting edema. There is one large wound anteriorly 2 on the medial aspect of his left leg and 3 small areas on the superior posterior calf. Drainage is not excessive he is tolerating a Profore light well 04/15/16; put a Profore wrap on him last week. This is controlled is edema however he had a lot of pain on his left anterior foot most of his wounds are healed 04/22/16 once again the patient has denuded areas on the left anterior foot which he states are because his wrap slips up word. He saw his primary physician today is on Lasix 40 twice a day and states that he his weight is down 20 pounds over the last 3 months. 04/29/16: Much improved. left anterior foot much improved. He is now on Lasix 80 mg per day. Much improved edema control 05/06/16; I was hoping to be able to discharge him today however once again he has blisters at a low level of where the compression was placed last week mostly on his left lateral but also his left medial leg and a small area on the anterior part of the left foot. 05/09/16; apparently the patient went home after his appointment on 7/4 later in  the evening developing pain in his upper medial thigh together with subjective fever and chills although his temperature was not taken. The pain was so intense he felt he would probably have to call 911. However he then remembered that he had leftover doxycycline from a previous round of antibiotics and took these. By the next morning he felt a lot better. He called and spoke to one of our nurses and I approved doxycycline over the phone thinking that this was in relation to the wounds we had previously seen although they were definitely were not. The patient feels a  lot better old fever no chills he is still working. Blood sugars are reasonably controlled 05/13/16; patient is back in for review of his cellulitis on his anterior medial upper thigh. He is taking doxycycline this is a lot better. Culture I did of the nodular area on the dorsal aspect of his foot grew MRSA this also looks a lot better. 05/20/16; the patient is cellulitis on the medial upper thigh has resolved. All of his wound areas including the left anterior foot, areas on the medial aspect of the left calf and the lateral aspect of the calf at all resolved. He has a new blister on the left dorsal foot at the level of the fourth toe this was excised. No evidence of infection 05/27/16; patient continues to complain weeping edema. He has new blisterlike wounds on the left anterior lateral and posterior lateral calf at the top of his wrap levels. The area on his left anterior foot appears better. He is not complaining of fever, pain or pruritus in his feet. 05/30/16; the patient's blisters on his left anterior leg posterior calf all look improved. He did not increase the Lasix 100 mg as I suggested because he was going to run out of his 40 mg tablets. He is still having weeping edema of his toes 06/03/16; I renewed his Lasix at 80 mg once a day as he was about to run out when I last saw him. He is on 80 mg of Lasix now. I have asked him to cut down on the excessive amount of water he was drinking and asked him to drink according to his thirst mechanisms 06/12/2016 -- was seen 2 days ago and was supposed to wear his compression stockings at home but he is developed lymphedema and superficial blisters on the left lower extremity and hence came in for a review 06/24/16; the remaining wound is on his left anterior leg. He still has edema coming from between his toes. There is lymphedema here however his edema is generally better than when I last saw this. He has a history of atrial fibrillation but does  not have a known history of congestive heart failure nevertheless I think he probably has this at least on a diastolic basis. 07/01/16 I reviewed his echocardiogram from January 2017. This was essentially normal. He did not have LVH, EF of 55-60%. His right ventricular function was normal although he did have trivial tricuspid and pulmonic regurgitation. This is not audible on exam however. I increased his Lasix to do massive edema in his legs well above his knees I think in early July. He was also drinking an excessive amount of water at the time. 07/15/16; missed his appointment last week because of the Labor Day holiday on Monday. He could not get another appointment later in the week. Started to feel the wrap digging in superiorly so we remove the top half and the bottom  half of his wrap. He has extensive erythema and blistering superiorly in the left leg. Very tender. Very swollen. Edema in his foot with leaking edema fluid. He has not been systemically unwell 07/22/16; the area on the left leg laterally required some debridement. The medial wounds look more stable. His wrap injury wounds appear to have healed. Edema and his foot is better, weeping edema is also better. He tells me he is meeting with the supplier of the external compression pumps at work 08/05/16; the patient was on vacation last week in [facility]. His wrap is been on for an extended period of time. Also over the weekend he developed an extensive area of tender erythema across his anterior medial thigh. He took to doxycycline yesterday that he had leftover from a previous prescription. The patient complains of weeping edema coming out of his toes 08/08/16; I saw this patient on 10/2. He was tender across his anterior thigh. I put him on doxycycline. He returns today in follow-up. He does not have any open wounds on his lower leg, he still has edema weeping into his toes. 08/12/16; patient was seen back urgently today to  follow-up for his extensive left thigh cellulitis/erysipelas. He comes back with a lot less swelling and erythema pain is much better. I believe I gave him Augmentin and Cipro. His wrap was cut down as he stated a roll down his legs. He developed blistering above the level of the wrap that remained. He has 2 open blisters and 1 intact. 08/19/16; patient is been doing his primary doctor who is increased his Lasix from 40-80 once a day or 80 already has less edema. Cellulitis has remained improved in the left thigh. 2 open areas on the posterior left calf 08/26/16; he returns today having new open blisters on the anterior part of his left leg. He has his compression pumps but is not yet been shown how to use some vital representative from the supplier. 09/02/16 patient returns today with no open wounds on the left leg. Some maceration in his plantar toes 09/10/2016 -- Dr. Leanord Hawking had recently discharged him on 09/02/2016 and he has come right back with redness swelling and some open ulcers on his left lower extremity. He says this was caused by trying to apply his compression stockings and he's been unable to use this and has not been able to use his lymphedema pumps. He had some doxycycline leftover and he has started on this a few days ago. 09/16/16; there are no open wounds on his leg on the left and no evidence of cellulitis. 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 He does not complain of symptoms here. I am not clear use using his external compression pumps. 09/23/16; I have not seen this patient in 2 weeks. He canceled his appointment 10 days ago as he was going on vacation. He tells me that on Monday he noticed a large area on his posterior left leg which is been draining copiously and is reopened into a large wound. He is been using ABDs and the external part of his juxtalite, according to our nurse this was not on  properly. 10/07/16; Still a substantial area on the posterior left leg. Using silver alginate 10/14/16; in general better although there is still open area which looks healthy. Still using silver alginate. He reminds me that this happen before he left for [facility]. Today while he was showering in the morning.  He had been using his juxtalite's 10/21/16; the area on his posterior left leg is fully epithelialized. However he arrives today with a large area of tender erythema in his medial and posterior left thigh just above the knee. I have marked the area. Once again he is reluctant to consider hospitalization. I treated him with oral antibiotics in the past for a similar situation with resolution I think with doxycycline however this area it seems more extensive to me. He is not complaining of fever but does have chills and says states he is thirsty. His blood sugar today was in the 140s at home 10/25/16 the area on his posterior left leg is fully epithelialized although there is still some weeping edema. The large area of tenderness and erythema in his medial and posterior left thigh is a lot less tender although there is still a lot of swelling in this thigh. He states he feels a lot better. He is on doxycycline and Augmentin that I started last week. This will continued until Tuesday, December 26. I have ordered a duplex ultrasound of the left thigh rule out DVT whether there is an abscess something that would need to be drained I would also like to know. 11/01/16; he still has weeping edema from a not fully epithelialized area on his left posterior calf. Most of the rest of this looks a lot better. He has completed his antibiotics. His thigh is a lot better. Duplex ultrasound did not show a DVT in the thigh 11/08/16; he comes in today with more Denuded surface epithelium from the posterior aspect of his calf. There is no real evidence of cellulitis. The superior aspect of his wrap appears to  have put quite an indentation in his leg just below the knee and this may have contributed. He does not complain of pain or fever. We have been using silver alginate as the primary dressing. The area of cellulitis in the right thigh has totally resolved. He has been using his compression stockings once a week 11/15/16; the patient arrives today with more loss of epithelium from the posterior aspect of his left calf. He now has a fairly substantial wound in this area. The reason behind this deterioration isn't exactly clear although his edema is not well controlled. He states he feels he is generally more swollen systemically. He is not complaining of chest pain shortness of breath fever. Tells me he has an appointment with his primary physician in early February. He is on 80 mg of oral Lasix a day. He claims compliance with the external compression pumps. He is not having any pain in his legs similar to what he has with his recurrent cellulitis 11/22/16; the patient arrives a follow-up of his large area on his left lateral calf. This looks somewhat better today. He came in earlier in the week for a dressing change since I saw him a week ago. He is not complaining of any pain no shortness of breath no chest pain 11/28/16; the patient arrives for follow-up of his large area on the left lateral calf this does not look better. In fact it is larger weeping edema. The surface of the wound does not look too bad. We have been using silver alginate although I'm not certain that this is a dressing issue. 12/05/16; again the patient follows up for a large wound on the left lateral and left posterior calf this does not look better. There continues to be weeping edema necrotic surface tissue. More worrisome than  this once again there is erythema below the wound involving the distal Achilles and heel suggestive of cellulitis. He is on his feet working most of the day of this is not going well. We are changing his  dressing twice a week to facilitate the drainage. 12/12/16; not much change in the overall dimensions of the large area on the left posterior calf. This is very inflamed looking. I gave him an. Doxycycline last week does not really seem to have helped. He found the wrap very painful indeed it seems to of dog into his legs superiorly and perhaps around the heel. He came in early today because the drainage had soaked through his dressings. 12/19/16- patient arrives for follow-up evaluation of his left lower extremity ulcers. He states that he is using his lymphedema pumps once daily when there is "no drainage". He admits to not using his lipedema pumps while under current treatment. His blood sugars have been consistently between 150-200. 12/26/16; the patient is not using his compression pumps at home because of the wetness on his feet. I've advised him that I think it's important for him to use this daily. He finds his feet too wet, he can put a plastic bag over his legs while he is in the pumps. Otherwise I think will be in a vicious circle. We are using silver alginate to the major area on his left posterior calf 01/02/17; the patient's posterior left leg has further of all into 3 open wounds. All of them covered with a necrotic surface. He claims to be using his compression pumps once a day. His edema control is marginal. Continue with silver alginate 01/10/17; the patient's left posterior leg actually looks somewhat better. There is less edema, less erythema. Still has 3 open areas covered with a necrotic surface requiring debridement. He claims to be using his compression pumps once a day his edema control is better 01/17/17; the patient's left posterior calf look better last week when I saw him and his wrap was changed 2 days ago. He has noted increasing pain in the left heel and arrives today with much larger wounds extensive erythema extending down into the entire heel area especially tender  medially. He is not systemically unwell CBGs have been controlled no fever. Our intake nurse showed me limegreen drainage on his AVD pads. 01/24/17; his usual this patient responds nicely to antibiotics last week giving him Levaquin for presumed Pseudomonas. The whole entire posterior part of his leg is much better much less inflamed and in the case of his Achilles heel area much less tender. He has also had some epithelialization posteriorly there are still open areas here and still draining but overall considerably better 01/31/17- He has continue to tolerate the compression wraps. he states that he continues to use the lymphedema pumps daily, and can increase to twice daily on the weekends. He is voicing no complaints or concerns regarding his LLE ulcers 02/07/17-he is here for follow-up evaluation. He states that he noted some erythema to the left medial and anterior thigh, which he states is new as of yesterday. He is concerned about recurrent cellulitis. He states his blood sugars have been slightly elevated, this morning in the 180s 02/14/17; he is here for follow-up evaluation. When he was last here there was erythema superiorly from his posterior wound in his anterior thigh. He was prescribed Levaquin however a culture of the wound surface grew MRSA over the phone I changed him to doxycycline on Monday and things  seem to be a lot better. 02/24/17; patient missed his appointment on Friday therefore we changed his nurse visit into a physician visit today. Still using silver alginate on the large area of the posterior left thigh. He isn't new area on the dorsal left second toe 03/03/17; actually better today although he admits he has not used his external compression pumps in the last 2 days or so because of work responsibilities over the weekend. 03/10/17; continued improvement. External compression pumps once a day almost all of his wounds have closed on the posterior left calf. Better edema  control 03/17/17; in general improved. He still has 3 small open areas on the lateral aspect of his left leg however most of the area on the posterior part of his leg is epithelialized. He has better edema control. He has an ABD pad under his stocking on the right anterior lower leg although he did not let us look at that today. 03/24/17; patient arrives back in clinic today with no open areas however there are areas on the posterior left calf and anterior left calf that are less than 100% epithelialized. His edema is well controlled in the left lower leg. There is some pitting edema probably lymphedema in the left upper thigh. He uses compression pumps at home once per day. I tried to get him to do this twice a day although he is very reticent. 04/01/2017 -- for the last 2 days he's had significant redness, tenderness and weeping and came in for an urgent visit today. 04/07/17; patient still has 6 more days of doxycycline. He was seen by Dr. Con Memos last Wednesday for cellulitis involving the posterior aspect, lateral aspect of his Involving his heel. For the most part he is better there is less erythema and less weeping. He has been on his feet for 12 hours 2 over the weekend. Using his compression pumps once a day 04/14/17 arrives today with continued improvement. Only one area on the posterior left calf that is not fully epithelialized. He has intense bilateral venous inflammation associated with his chronic venous insufficiency disease and secondary lymphedema. We have been using silver alginate to the left posterior calf wound In passing he tells [place] today that the right leg but we have not seen in quite some time has an open area on it but he doesn't want [place] to look at this today states he will show this to [place] next week. 04/21/17; there is no open area on his left leg although he still reports some weeping edema. He showed [place] his right leg today which is the first time we've seen this leg in a  long time. He has a large area of open wound on the right leg anteriorly healthy granulation. Quite a bit of swelling in the right leg and some degree of venous inflammation. He told us about the right leg in passing last week but states that deterioration in the right leg really only happened over the weekend 04/28/17; there is no open area on the left leg although there is an irritated part on the posterior which is like a wrap injury. The wound on the right leg which was new from last week at least to [place] is a lot better. 05/05/17; still no open area on the left leg. Patient is using his new compression stocking which seems to be doing a good job of controlling the edema. He states he is using his compression pumps once per day. The right leg still has an  open wound although it is better in terms of surface area. Required debridement. A lot of pain in the posterior right Achilles marked tenderness. Usually this type of presentation this patient gives concern for an active cellulitis 05/12/17; patient arrives today with his major wound from last week on the right lateral leg somewhat better. Still requiring debridement. He was using his compression stocking on the left leg however that is reopened with superficial wounds anteriorly he did not have an open wound on this leg previously. He is still using his juxta light's once daily at night. He cannot find the time to do this in the morning as he has to be at work by 7 AM 05/19/17; right lateral leg wound looks improved. No debridement required. The concerning area is on the left posterior leg which appears to almost have a subcutaneous hemorrhagic component to it. We've been using silver alginate to all the wounds 05/26/17; the right lateral leg wound continues to look improved. However the area on the left posterior calf is a tightly adherent surface. [place] using silver alginate. Because of the weeping edema in his legs there is very little  good alternatives. 06/02/17; the patient left here last week looking quite good. Major wound on the left posterior calf and a small one on the right lateral calf. Both of these look satisfactory. He tells me that by Wednesday he had noted increased pain in the left leg and drainage. He called on Thursday and Friday to get an appointment here but we were blocked. He did not go to urgent care or his primary physician. He thinks he had a fever on Thursday but did not actually take his temperature. He has not been using his compression pumps on the left leg because of pain. I advised him to go to the emergency room today for IV antibiotics for stents of left leg cellulitis but he has refused I have asked him to take 2 days off work to keep his leg elevated and he has refused this as well. In view of this I'm going to call him and Augmentin and doxycycline. 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 However after a detailed discussion regarding the risk benefits and alternatives he refuses to get admitted to the hospital. With no other recourse I will continue him on oral antibiotics as before and hopefully he'll have his infectious disease consultation this week. 06/16/2017 -- the patient was seen today by the nurse practitioner at infectious disease Ms. Dixon. 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 He has also been advise Lotrimin cream locally. she also had wise IV antibiotics for his condition if it flares up 06/23/17; patient arrives today with drainage bilaterally although the  remaining wound on the left posterior calf after cleaning up today "highlighter yellow drainage" did not look too bad. Unfortunately he has had breakdown on the right anterior leg [previously this leg had not been open and he is using a black stocking] he went to see infectious disease and is been put on clindamycin 150 mg daily, I  did not verify the dose although I'm not familiar with using clindamycin in this dosing range, perhaps for prophylaxisoo 06/27/17; I brought this patient back today to follow-up on the wound deterioration on the right lower leg together with surrounding cellulitis. I started him on doxycycline 4 days ago. This area looks better however he comes in today with intense cellulitis on the medial part of his left thigh. This is not have a wound in this area. Extremely tender. We've been using silver alginate to the wounds on the right lower leg left lower leg with bilateral 4 layer compression he is using his external compression pumps once a day 07/04/17; patient's left medial thigh cellulitis looks better. He has not been using his compression pumps as his insert said it was contraindicated with cellulitis. His right leg continues to make improvements all the wounds are still open. We only have one remaining wound on the left posterior calf. Using silver alginate to all open areas. He is on doxycycline which I started a week ago and should be finishing I gave him Augmentin after Thursday's visit for the severe cellulitis on the left medial thigh which fortunately looks better 07/14/17; the patient's left medial thigh cellulitis has resolved. The cellulitis in his right lower calf on the right also looks better. All of his wounds are stable to improved we've been using silver alginate he has completed the antibiotics I have given him. He has clindamycin 150 mg once a day prescribed by infectious disease for prophylaxis, I've advised him to start this now. We have been using  bilateral Unna boots over silver alginate to the wound areas 07/21/17; the patient is been to see infectious disease who noted his recurrent problems with cellulitis. He was not able to tolerate prophylactic clindamycin therefore he is on amoxicillin 500 twice a day. He also had a second daily dose of Lasix added By Dr. Oneta RackMcKeown but he is not taking this. Nor is he being completely compliant with his compression pumps a especially not this week. He has 2 remaining wounds one on the right posterior lateral lower leg and one on the left posterior medial lower leg. 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 The patient has Unna boots bilaterally. 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 He has bilateral Unna boots for compression and silver alginate to his wounds. Arrives today with his legs looking as good as I have seen him in quite some time. Not surprisingly his wounds look better as well with improvement on the right lateral leg venous insufficiency wound and also the left medial leg. He is still using the compression pumps once a day 08/11/17; both legs appear to be doing better wounds on the right lateral and left medial legs look better. Skin on the right leg quite good. He is been using silver alginate as the primary dressing. I'm going to use Anasept gel calcium alginate and maintain all the secondary dressings 08/18/17; the patient continues to actually do quite well. The area on his right lateral leg is just about closed the left medial also looks better although it is still moist in this area. His edema is well controlled we have been using Anasept gel with calcium alginate and the usual secondary dressings, 4 layer compression and once daily  use of his compression  pumps "always been able to manage 09/01/17; the patient continues to do reasonably well in spite of his trip to [place]. The area on the right lateral leg is epithelialized. Left is much better but still open. He has more edema and more chronic erythema on the left leg [venous inflammation] 09/08/17; he arrives today with no open wound on the right lateral leg and decently controlled edema. Unfortunately his left leg is not nearly as in his good situation as last week.he apparently had increasing edema starting on Saturday. He edema soaked through into his foot so used a plastic bag to walk around his home. The area on the medial right leg which was his open area is about the same however he has lost surface epithelium on the left lateral which is new and he has significant pain in the Achilles area of the left foot. He is already on amoxicillin chronically for prophylaxis of cellulitis in the left leg 09/15/17; he is completed a week of doxycycline and the cellulitis in the left posterior leg and Achilles area is as usual improved. He still has a lot of edema and fluid soaking through his dressings. There is no open wound on the right leg. He saw infectious disease NP today 09/22/17;As usual 1 we transition him from our compression wraps to his stockings things did not go well. He has several small open areas on the right leg. He states this was caused by the compression wrap on his skin although he did not wear this with the stockings over them. He has several superficial areas on the left leg medially laterally posteriorly. He does not have any evidence of active cellulitis especially involving the left Achilles The patient is traveling from [facility] Saturday going to [facility]. He states he isn't attempting to get an appointment with a heel objects wound center there to change his dressings. I am not completely certain whether this will work 10/06/17; the patient came in on Friday for a  nurse visit and the nurse reported that his legs actually look quite good. He arrives in clinic today for his regular follow-up visit. He has a new wound on his left third toe over the PIP probably caused by friction with his footwear. He has small areas on the left leg and a very superficial but epithelialized area on the right anterior lateral lower leg. Other than that his legs look as good as I've seen him in quite some time. We have been using silver alginate Review of systems; no chest pain no shortness of breath other than this a 10 point review of systems negative 10/20/17; seen by Dr. Meyer Russel last week. He had taken some antibiotics [doxycycline] that he had left over. Dr. Meyer Russel thought he had candida infection and declined to give him further antibiotics. He has a small wound remaining on the right lateral leg several areas on the left leg including a larger area on the left posterior several left medial and anterior and a small wound on the left lateral. The area on the left dorsal third toe looks a lot better. ROS; Gen.; no fever, respiratory no cough no sputum Cardiac no chest pain other than this 10 point review of system is negative 10/30/17; patient arrives today having fallen in the bathtub 3 days ago. It took him a while to get up. He has pain and maceration in the wounds on his left leg which have deteriorated. He has not been using his pumps  he also has some maceration on the right lateral leg. 11/03/17; patient continues to have weeping edema especially in the left leg. This saturates his dressings which were just put on on 12/27. As usual the doxycycline seems to take care of the cellulitis on his lower leg. He is not complaining of fever, chills, or other systemic symptoms. He states his leg feels a lot better on the doxycycline I gave him empirically. He also apparently gets injections at his primary doctor's officeo Rocephin for cellulitis prophylaxis. I didn't ask him  about his compression pump compliance today I think that's probably marginal. Arrives in the clinic with all of his dressings primary and secondary macerated full of fluid and he has bilateral edema 11/10/17; the patient's right leg looks some better although there is still a cluster of wounds on the right lateral. The left leg is inflamed with almost circumferential skin loss medially to laterally although we are still maintaining anteriorly. He does not have overt cellulitis there is a lot of drainage. He is not using compression pumps. We have been using silver alginate to the wound areas, there are not a lot of options here 11/17/17; the patient's right leg continues to be stable although there is still open wounds, better than last week. The inflammation in the left leg is better. Still loss of surface layer epithelium especially posteriorly. There is no overt cellulitis in the amount of edema and his left leg is really quite good, tells me he is using his compression pumps once a day. 11/24/17; patient's right leg has a small superficial wound laterally this continues to improve. The inflammation in the left leg is still improving however we have continuous surface layer epithelial loss posteriorly. There is no overt cellulitis in the amount of edema in both legs is really quite good. He states he is using his compression pumps on the left leg once a day for 5 out of 7 days 12/01/17; very small superficial areas on the right lateral leg continue to improve. Edema control in both legs is better today. He has continued loss of surface epithelialization and left posterior calf although I think this is better. We have been using silver alginate with large number of absorptive secondary dressings 4 layer on the left Unna boot on the right at his request. He tells me he is using his compression pumps once a day 12/08/17; he has no open area on the right leg is edema control is good here. On the left leg  however he has marked erythema and tenderness breakdown of skin. He has what appears to be a wrap injury just distal to the popliteal fossa. 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 There was swelling erythema and breakdown in the right leg. He has much worse skin breakdown in the left leg as well multiple open areas medially and posteriorly as well as laterally. He tells me he has been using his compression pumps but tells me he feels that the drainage out of his leg is worse when he uses a compression pumps. To be fair to him he is been saying this for a while however I don't know that I have really been listening to this. I wonder if  the compression pumps are working properly 12/22/17;. Once again he arrives with severe erythema, weeping edema from the left greater than right leg. Noncompliance with compression pumps. New this visit he is complaining of pain on the lateral aspect of the right leg and the medial aspect of his right thigh. He apparently saw his cardiologist Dr. Debara Pickett who was ordered an echocardiogram area and I think this is a step in the right direction 12/25/17; started his doxycycline Monday night. There is still intense erythema of the right leg especially in the anterior thigh although there is less tenderness. The erythema around the wound on the right lateral calf also is less tender. He still complaining of pain in the left heel. His wounds are about the same right lateral left medial left lateral. Superficial but certainly not close to closure. He denies being systemically unwell no fever chills no abdominal pain no diarrhea 12/29/17; back in follow-up of his extensive right calf and right thigh  cellulitis. I added amoxicillin to cover possible doxycycline resistant strep. This seems to of done the trick he is in much less pain there is much less erythema and swelling. He has his echocardiogram at 11:00 this morning. X-ray of the left heel was also negative. 01/05/18; the patient arrived with his edema under much better control. Now that he is retired he is able to use his compression pumps daily and sometimes twice a day per the patient. He has a wound on the right leg the lateral wound looks better. Area on the left leg also looks a lot better. He has no evidence of cellulitis in his bilateral thighs I had a quick peak at his echocardiogram. He is in normal ejection fraction and normal left ventricular function. He has moderate pulmonary hypertension moderately reduced right ventricular function. One would have to wonder about chronic sleep apnea although he says he doesn't snore. He'll review the echocardiogram with his cardiologist. 01/12/18; the patient arrives with the edema in both legs under exemplary control. He is using his compression pumps daily and sometimes twice daily. His wound on the right lateral leg is just about closed. He still has some weeping areas on the posterior left calf and lateral left calf although everything is just about closed here as well. I have spoken with Jeri Lager who is the patient's nurse practitioner and infectious disease. She was concerned that the patient had not understood that the parenteral penicillin injections he was receiving for cellulitis prophylaxis was actually benefiting him. I don't think the patient actually saw that I would tend to agree we were certainly dealing with less infections although he had a serious one last month. 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 He is healed. He'll be placed in juxtalite compression wraps and increase his lymphedema pumps to twice daily. We will follow up  again next week to ensure there are no issues with the new regiment. 01/20/18-he is here for evaluation of bilateral lower extremity weeping edema. Yesterday he was placed in compression wrap to the right lower extremity and compression stocking to left lower shrubbery. He states he uses lymphedema pumps last night and again this morning and noted a blister to the left lower extremity. On exam he was noted to have drainage to the right lower extremity. He will be placed in Unna boots bilaterally and follow-up next week 01/26/18; patient was actually discharged a week ago to his own juxta light stockings only to return the next  day with bilateral lower extremity weeping edema.he was placed in bilateral Unna boots. He arrives today with pain in the back of his left leg. There is no open area on the right leg however there is a linear/wrap injury on the left leg and weeping edema on the left leg posteriorly. I spoke with infectious disease about 10 days ago. They were disappointed that the patient elected to discontinue prophylactic intramuscular penicillin shots as they felt it was particularly beneficial in reducing the frequency of his cellulitis. I discussed this with the patient today. He does not share this view. He'll definitely need antibiotics today. Finally he is traveling to [place] and trauma leaving this Saturday and returning a week later and he does not travel with his pumps. He is going by car 01/30/18; patient was seen 4 days ago and brought back in today for review of cellulitis in the left leg posteriorly. I put him on amoxicillin this really hasn't helped as much as I might like. He is also worried because he is traveling to [facility] trauma by car. Finally we will be rewrapping him. There is no open area on the right leg over his left leg has multiple weeping areas as usual 02/09/18; The same wrap on for 10 days. He did not pick up the last doxycycline I prescribed for him. He  apparently took 4 days worth he already had. There is nothing open on his right leg and the edema control is really quite good. He's had damage in the left leg medially and laterally especially probably related to the prolonged use of Unna boots 02/12/18; the patient arrived in clinic today for a nurse visit/wrap change. He complained of a lot of pain in the left posterior calf. He is taking doxycycline that I previously prescribed for him. Unfortunately even though he used his stockings and apparently used to compression pumps twice a day he has weeping edema coming out of the lateral part of his right leg. This is coming from the lower anterior lateral skin area. 02/16/18; the patient has finished his doxycycline and will finish the amoxicillin 2 days. The area of cellulitis in the left calf posteriorly has resolved. He is no longer having any pain. He tells me he is using his compression pumps at least once a day sometimes twice. 02/23/18; the patient finished his doxycycline and Amoxil last week. On Friday he noticed a small erythematous circle about the size of a quarter on the left lower leg just above his ankle. This rapidly expanded and he now has erythema on the lateral and posterior part of the thigh. This is bright red. Also has an area on the dorsal foot just above his toes and a tender area just below the left popliteal fossa. He came off his prophylactic penicillin injections at his own insistence one or 2 months ago. This is obviously deteriorated since then 03/02/18; patient is on doxycycline and Amoxil. Culture I did last week of the weeping area on the back of his left calf grew group B strep. I have therefore renewed the amoxicillin 500 3 times a day for a further week. He has not been systemically unwell. Still complaining of an area of discomfort right under his left popliteal fossa. There is no open wound on the right leg. He tells me that he is using his pumps twice a day on  most days 03/09/18; patient arrives in clinic today completing his amoxicillin today. The cellulitis on his left leg is better. Furthermore  he tells me that he had intramuscular penicillin shots that his primary care office today. However he also states that the wrap on his right leg fell down shortly after leaving clinic last week. He developed a large blister that was present when he came in for a nurse visit later in the week and then he developed intense discomfort around this area.He tells me he is using his compression pumps 03/16/18; the patient has completed his doxycycline. The infectious part of this/cellulitis in the left heel area left popliteal area is a lot better. He has 2 open areas on the right calf. Still areas on the left calf but this is a lot better as well. 03/24/18; the patient arrives complaining of pain in the left popliteal area again. He thinks some of this is wrap injury. He has no open area on the right leg and really no open area on the left calf either except for the popliteal area. He claims to be compliant with the compression pumps 03/31/18; I gave him doxycycline last week because of cellulitis in the left popliteal area. This is a lot better although the surface epithelium is denuded off and response to this. He arrives today with uncontrolled edema in the right calf area as well as a fingernail injury in the right lateral calf. There is only a few open areas on the left 04/06/18; I gave him amoxicillin doxycycline over the last 2 weeks that the amoxicillin should be completing currently. He is not complaining of any pain or systemic symptoms. The only open areas see has is on the right lateral lower leg paradoxically I cannot see anything on the left lower leg. He tells me he is using his compression pumps twice a day on most days. Silver alginate to the wounds that are open under 4 layer compression 04/13/18; he completed antibiotics and has no new complaints. Using  his compression pumps. Silver alginate that anything that's opened 04/20/18; he is using his compression pumps religiously. Silver alginate 4 layer compression anything that's opened. He comes in today with no open wounds on the left leg but 3 on the right including a new one posteriorly. He has 2 on the right lateral and one on the right posterior. He likes Unna boots on the right leg for reasons that aren't really clear we had the usual 4 layer compression on the left. It may be necessary to move to the 4 layer compression on the right however for now I left them in the Unna boots 04/27/18; he is using his compression pumps at least once a day. He has still the wounds on the right lateral calf. The area right posteriorly has closed. He does not have an open wound on the left under 4 layer compression however on the dorsal left foot just proximal to the toes and the left third toe 2 small open areas were identified 05/11/18; he has not uses compression pumps. The areas on the right lateral calf have coalesced into one large wound necrotic surface. On the left side he has one small wound anteriorly however the edema is now weeping out of a large part of his left leg. He says he wasn't using his pumps because of the weeping fluid. I explained to him that this is the time he needs to pump more 05/18/18; patient states he is using his compression pumps twice a day. The area on the right lateral large wound albeit superficial. On the left side he has innumerable number of  small new wounds on the left calf particularly laterally but several anteriorly and medially. All these appear to have healthy granulated base these look like the remnants of blisters however they occurred under compression. The patient arrives in clinic today with his legs somewhat better. There is certainly less edema, less multiple open areas on the left calf and the right anterior leg looks somewhat better as well superficial and a  little smaller. However he relates pain and erythema over the last 3-4 days in the thigh and I looked at this today. He has not been systemically unwell no fever no chills no change in blood sugar values 05/25/18; comes in today in a better state. The severe cellulitis on his left leg seems better with the Keflex. Not as tender. He has not been systemically unwell Hard to find an open wound on the left lower leg using his compression pumps twice a day The confluent wounds on his right lateral calf somewhat better looking. These will ultimately need debridement I didn't do this today. 06/01/18; the severe cellulitis on the left anterior thigh has resolved and he is completed his Keflex. There is no open wound on the left leg however there is a superficial excoriation at the base of the third toe dorsally. Skin on the bottom of his left foot is macerated looking. The left the wounds on the lateral right leg actually looks some better although he did require debridement of the top half of this wound area with an open curet 06/09/18 on evaluation today patient appears to be doing poorly in regard to his right lower extremity in particular this appears to likely be infected he has very thick purulent discharge along with a bright green tent to the discharge. This makes me concerned about the possibility of pseudomonas. He's also having increased discomfort at this point on evaluation. Fortunately there does not appear to be any evidence of infection spreading to the other location at this time. 06/16/18 on evaluation today patient appears to actually be doing fairly well. His ulcer has actually diminished in size quite significantly at this point which is good news. Nonetheless he still does have some evidence of infection he did see infectious disease this morning before coming here for his appointment. I did review the results of their evaluation and their note today. They did actually have him  discontinue the Cipro and initiate treatment with linezolid at this time. He is doing this for the next seven days and they recommended a follow-up in four months with them. He is the keep a log of the need for intermittent antibiotic therapy between now and when he falls back up with infectious disease. This will help them gaze what exactly they need to do to try and help them out. 06/23/18; the patient arrives today with no open wounds on the left leg and left third toe healed. He is been using his compression pumps twice a day. On the right lateral leg he still has a sizable wound but this is a lot better than last time I saw this. 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 Has been using silver alginate under 4 layer compression 06/30/18; the only open wound he has is on the right lateral leg and this looks healthy. No debridement is required. We have been using silver alginate. He does not have an open wound on the left leg. There  is apparently some drainage from the dorsal proximal third toe on the left although I see no open wound here. 07/03/18 on evaluation today patient was actually here just for a nurse visit rapid change. However when he was here on Wednesday for his rat change due to having been healed on the left and then developing blisters we initiated the wrap again knowing that he would be back today for us to reevaluate and see were at. Unfortunately he has developed some cellulitis into the proximal portion of his right lower extremity even into the region of his thigh. 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 He was placed on one as what at that point. Nonetheless he is done with that and has been tolerating it well otherwise. Doxycycline which in the past really did not seem to be effective for him. Nonetheless I think the best  option may be for us to definitely reinitiate the antibiotics for a longer period of time. 07/07/18; since I last saw this patient a week ago he has had a difficult time. At that point he did not have an open wound on his left leg. We transitioned him into juxta light stockings. He was apparently in the clinic the next day with blisters on the left lateral and left medial lower calf. He also had weeping edema fluid. He was put back into a compression wrap. He was also in the clinic on Friday with intense erythema in his right thigh. Per the patient he was started on Bactrim however that didn't work at all in terms of relieving his pain and swelling. He has taken 3 doxycycline that he had left over from last time and that seems to of helped. He has blistering on the right thigh as well. 07/14/18; the erythema on his right thigh has gotten better with doxycycline that he is finishing. 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 Presumably this cellulitis in the thigh was not related to that although I think this is a bit concerning going forward. He still has an area on the right lateral calf the blister on the right medial calf just below the knee that was discussed above. On the left 2 small open areas left medial and left lateral. Edema control is adequate. He is using his compression pumps twice a day 07/20/18; continued improvement in the condition of both legs especially the edema in his bilateral thighs. He tells me he is been losing weight through a combination of diet and exercise. He is using his compression pumps twice a day. So overall she made to the remaining wounds 07/27/2018; continued improvement in condition of both legs. His edema is well controlled. The area on the right lateral leg is just about closed he had one blisters show up on the medial left upper calf. We have him in 4 layer compression. He is going on  a 10-day trip to [place], [place] and [place]. He will be driving. He wants to wear Unna boots because of the lessening amount of constriction. He will not use compression pumps while he is away 08/05/18 on evaluation today patient actually appears to be doing decently well all things considered in regard to his bilateral lower extremities. The worst ulcer is actually only posterior aspect of his left lower extremity with a four layer compression wrap cut into his leg a couple weeks  back. He did have a trip and actually had [employer] for the trip that he is worn since he was last here. Nonetheless he feels like the [employer] actually do better for him his swelling is up a little bit but he also with his trip was not taking his Lasix on a regular set schedule like he was supposed to be. He states that obviously the reason being that he cannot drive and keep going without having to urinate too frequently which makes it difficult. He did not have his pumps with him while he was away either which I think also maybe playing a role here too. 08/13/2018; the patient only has a small open wound on the right lateral calf which is a big improvement in the last month or 2. He also has the area posteriorly just below the posterior fossa on the left which I think was a wrap injury from several weeks ago. He has no current evidence of cellulitis. He tells me he is back into his compression pumps twice a day. He also tells me that while he was at the [place] somebody stole a section of his extremitease stockings 08/20/2018; back in the clinic with a much improved state. He only has small areas on the right lateral mid calf which is just about healed. This was is more substantial area for quite a prolonged period of time. He has a small open area on the left anterior tibia. The area on the posterior calf just below the popliteal fossa is closed today. He is using his compression pumps twice a  day 08/28/2018; patient has no open wound on the right leg. He has a smattering of open areas on the calf with some weeping lymphedema. More problematically than that it looks as though his wraps of slipped down in his usual he has very angry upper area of edema just below the right medial knee and on the right lateral calf. He has no open area on his feet. The patient is traveling to [facility] next week. I will send him in an antibiotic. We will continue to wrap the right leg. We ordered extremitease stockings for him last week and I plan to transition the right leg to a stocking when he gets home which will be in 10 days time. As usual he is very reluctant to take his pumps with him when he travels 09/07/2018; patient returns from [facility]. He shows me a picture of his left leg in the mid part of his trip last week with intense fire engine erythema. The picture look bad enough I would have considered sending him to the hospital. Instead he went to the wound care center in [facility]. They did not prescribe him antibiotics but he did take some doxycycline he had leftover from a previous visit. I had given him trimethoprim sulfamethoxazole before he left this did not work according to the patient. This is resulted in some improvement fortunately. He comes back with a large wound on the left posterior calf. Smaller area on the left anterior tibia. Denuded blisters on the dorsal left foot over his toes. Does not have much in the way of wounds on the right leg although he does have a very tender area on the right posterior area just below the popliteal fossa also suggestive of infection. He promises me he is back on his pumps twice a day 09/15/2018; the intense cellulitis in his left lower calf is a lot better.  The wound area on the posterior left calf is also so better. However he has reasonably extensive wounds on the dorsal aspect of his second and third toes and the proximal  foot just at the base of the toes. There is nothing open on the right leg 09/22/2018; the patient has excellent edema control in his legs bilaterally. He is using his external compression pumps twice a day. He has no open area on the right leg and only the areas in the left foot dorsally second and third toe area on the left side. He does not have any signs of active cellulitis. 10/06/2018; the patient has good edema control bilaterally. He has no open wound on the right leg. There is a blister in the posterior aspect of his left calf that we had to deal with today. He is using his compression pumps twice a day. There is no signs of active cellulitis. We have been using silver alginate to the wound areas. He still has vulnerable areas on the base of his left first second toes dorsally He has a his extremities stockings and we are going to transition him today into the stocking on the right leg. He is cautioned that he will need to continue to use the compression pumps twice a day. If he notices uncontrolled edema in the right leg he may need to go to 3 times a day. 10/13/2018; the patient came in for a nurse check on Friday he has a large flaccid blister on the right medial calf just below the knee. We unroofed this. He has this and a new area underneath the posterior mid calf which was undoubtedly a blister as well. He also has several small areas on the right which is the area we put his extremities stocking on. 10/19/2018; the patient went to see infectious disease this morning I am not sure if that was a routine follow-up in any case the doxycycline I had given him was discontinued and started on linezolid. He has not started this. It is easy to look at his left calf and the inflammation and think this is cellulitis however he is very tender in the tissue just below the popliteal fossa and I have no doubt that there is infection going on here. He states the problem he is having is that with the  compression pumps the edema goes down and then starts walking the wrap falls down. We will see if we can adhere this. He has 1 or 2 minuscule open areas on the right still areas that are weeping on the posterior left calf, the base of his left second and third toes 10/26/18; back today in clinic with quite of skin breakdown in his left anterior leg. This may have been infection the area below the popliteal fossa seems a lot better however tremendous epithelial loss on the left anterior mid tibia area over quite inexpensive tissue. He has 2 blisters on the right side but no other open wound here. 10/29/2018; came in urgently to see [place] today and we worked him in for review. He states that the 4 layer compression on the right leg caused pain he had to cut it down to roughly his mid calf this caused swelling above the wrap and he has blisters and skin breakdown today. As a result of the pain he has not been using his pumps. Both legs are a lot more edematous and there is a lot of weeping fluid. 11/02/18; arrives in clinic with continued difficulties in  the right leg> left. Leg is swollen and painful. multiple skin blisters and new open areas especially laterally. He has not been using his pumps on the right leg. 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 He is not systemically unwell. 11/09/2018; the patient claims he is being compliant with his pumps. He is finished the doxycycline I gave him last week. Culture I did of the wound on the right lateral leg showed a few very resistant methicillin staph aureus. This was resistant to doxycycline. Nevertheless he states the pain in the leg is a lot better which makes me wonder if the cultured organism was not really what was causing the problem nevertheless this is a very dangerous organism to be culturing out of any wound. His right leg is still a lot larger than the left. He is using an [employer] boot on this area,  he blames a 4-layer compression for causing the original skin breakdown which I doubt is true however I cannot talk him out of it. We have been using silver alginate to all of these areas which were initially blisters 11/16/2018; patient is being compliant with his external compression pumps at twice a day. Miraculously he arrives in clinic today with absolutely no open wounds. He has better edema control on the left where he has been using 4 layer compression versus wound of wounds on the right and I pointed this out to him. There is no inflammation in the skin in his lower legs which is also somewhat unusual for him. There is no open wounds on the dorsal left foot. He has extremitease stockings at home and I have asked him to bring these in next week. 11/25/18 patient's lower extremity on examination today on the left appears for the most part to be wound free. He does have an open wound on the lateral aspect of the right lower extremity but this is minimal compared to what I've seen in past. He does request that we go ahead and wrap the left leg as well even though there's nothing open just so hopefully it will not reopen in short order. 1/28; patient has superficial open wounds on the right lateral calf left anterior calf and left posterior calf. His edema control is adequate. 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 We have been using silver alginate as the primary dressing. He claims compliance with his compression pumps 2/4; patient has superficial open wounds on numerous areas of his left calf and again one on the left dorsal foot. The areas on the right lateral calf have healed. The cellulitis that I gave him doxycycline for last week is also resolved this was mostly on the left anterior calf just below the tibial tuberosity. His edema looks fairly well-controlled. He tells me he went to see his primary  doctor today and had blood work ordered 2/11; once again he has several open areas on the left calf left tibial area. Most of these are small and appear to have healthy granulation. He does not have anything open on the right. The edema and control in his thighs is pretty good which is usually a good indication he has been using his pumps as requested. 2/18; he continues to have several small areas on the left calf and left tibial area. Most of these are small healthy granulation. We put him in his stocking on the right  leg last week and he arrives with a superficial open area over the right upper tibia and a fairly large area on the right lateral tibia in similar condition. His edema control actually does not look too bad, he claims to be using his compression pumps twice a day 2/25. Continued small areas on the left calf and left tibial area. New areas especially on the right are identified just below the tibial tuberosity and on the right upper tibia itself. There are also areas of weeping edema fluid even without an obvious wound. He does not have a considerable degree of lymphedema but clearly there is more edema here than his skin can handle. He states he is using the pumps twice a day. We have an Unna boot on the right and 4 layer compression on the left. 3/3; he continues to have an area on the right lateral calf and right posterior calf just below the popliteal fossa. There is a fair amount of tenderness around the wound on the popliteal fossa but I did not see any evidence of cellulitis, could just be that the wrap came down and rubbed in this area. He does not have an open area on the left leg however there is an area on the left dorsal foot at the base of the third toe We have been using silver alginate to all wound areas 3/10; he did not have an open area on his left leg last time he was here a week ago. Today he arrives with a horizontal wound just below the tibial tuberosity and an  area on the left lateral calf. He has intense erythema and tenderness in this area. The area is on the right lateral calf and right posterior calf better than last week. We have been using silver alginate as usual 3/18 - Patient returns with 3 small open areas on left calf, and 1 small open area on right calf, the skin looks ok with no significant erythema, he continues the UNA boot on right and 4 layer compression on left. The right lateral calf wound is closed , the right posterior is small area. we will continue silver alginate to the areas. 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 He is been tolerating the dressing changes without complication. Fortunately she has made excellent improvement in regard to the overall status of his wounds. Unfortunately every time we cease wrapping him he ends up reopening in causing more significant issues at that point. Again I'm unsure of the best direction to take although I think the lymphedema clinic may be appropriate for him. 02/03/19 on evaluation today patient appears to be doing well in regard to the wounds that we saw him for last week unfortunately he has a new area on the proximal portion of his right medial/posterior lower extremity where the wrap somewhat slowed down and caused swelling and a blister to rub and open. Unfortunately this is the only opening that he has on either leg at this point. 02/17/19 on evaluation today patient's bilateral lower extremities appear to be doing well. He still completely healed in regard to the left lower extremity. In regard to the right lower extremity the area where the wrap and slid down and caused the blister still seems to be slightly open although this is dramatically better than during the  last evaluation two  weeks ago. I'm very pleased with the way this stands overall. 03/03/19 on evaluation today patient appears to be doing well in regard to his right lower extremity in general although he did have a new blister open this does not appear to be showing any evidence of active infection at this time. Fortunately there's No fevers, chills, nausea, or vomiting noted at this time. Overall I feel like he is making good progress it does feel like that the right leg will we perform the [employer] seems to do with a bit better than three layer wrap on the left which slid down on him. We may switch to doing bilateral in the book wraps. 5/4; I have not seen Mr. Metter in quite some time. According to our case manager he did not have an open wound on his left leg last week. He had 1 remaining wound on the right posterior medial calf. He arrives today with multiple openings on the left leg probably were blisters and/or wrap injuries from Unna boots. I do not think the Unna boot's will provide adequate compression on the left. I am also not clear about the frequency he is using the compression pumps. 03/17/19 on evaluation today patient appears to be doing excellent in regard to his lower extremities compared to last week's evaluation apparently. He had gotten significantly worse last week which is unfortunate. The [employer] wrap on the left did not seem to do very well for him at all and in fact it didn't control his swelling significantly enough he had an additional outbreak. Subsequently we go back to the four layer compression wrap on the left. This is good news. At least in that he is doing better and the wound seem to be killing him. He still has not heard anything from the lymphedema clinic. 03/24/19 on evaluation today patient actually appears to be doing much better in regard to his bilateral lower Trinity as compared to last week when I saw him. Fortunately there's no signs of active infection at this time. He  has been tolerating the dressing changes without complication. Overall I'm extremely pleased with the progress and appearance in general. 04/07/19 on evaluation today patient appears to be doing well in regard to his bilateral lower extremities. His swelling is significantly down from where it was previous. With that being said he does have a couple blisters still open at this point but fortunately nothing that seems to be too severe and again the majority of the larger openings has healed at this time. 04/14/19 on evaluation today patient actually appears to be doing quite well in regard to his bilateral lower extremities in fact I'm not even sure there's anything significantly open at this time at any site. Nonetheless he did have some trouble with these wraps where they are somewhat irritating him secondary to the fact that he has noted that the graph wasn't too close down to the end of this foot in a little bit short as well up to his knee. Otherwise things seem to be doing quite well. 04/21/19 upon evaluation today patient's wound bed actually showed evidence of being completely healed in regard to both lower extremities which is excellent news. There does not appear to be any signs of active infection which is also good news. I'm very pleased in this regard. No fevers, chills, nausea, or vomiting noted at this time. 04/28/19 on evaluation today patient appears to be doing a little bit worse in regard  to both lower extremities on the left mainly due to the fact that when he went infection disease the wrap was not wrapped quite high enough he developed a blister above this. On the right he is a small open area of nothing too significant but again this is continuing to give him some trouble he has been were in the Velcro compression that he has at home. 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 He's been tolerating the dressing changes  without complication. Fortunately there's no signs of active infection at this time. No fevers, chills, nausea, or vomiting noted at this time. We have been trying to get an appointment with her lymphedema clinic in [facility] but unfortunately nobody can get them on phone with not been able to even fax information over the patient likewise is not been able to get in touch with them. Overall I'm not sure exactly what's going on here with to reach out again today. 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 Still having a lot of drainage unfortunately. He tells me especially in the left but even on the right. There's no signs of active infection which is good news we've been using so ratcheted up to this point. 05/19/19 on evaluation today patient actually appears to be doing quite well with regard to his left lower extremity which is great news. Fortunately in regard to the right lower extremity has an issues with his wrap and he subsequently did remove this from what I'm understanding. Nonetheless long story short is what he had rewrapped once he removed it subsequently had maggots underneath this wrap whenever he came in for evaluation today. With that being said they were obviously completely cleaned away by the nursing staff. The visit today which is excellent news. However he does appear to potentially have some infection around the right ankle region where the maggots were located as well. He will likely require anabiotic therapy today. 05/26/19 on evaluation today patient actually appears to be doing much better in regard to his bilateral lower extremities. I feel like the infection is under much better control. With that being said there were maggots noted when the wrap was removed yet again today. Again this could have potentially been left over from previous although at this time there does  not appear to be any signs of significant drainage there was obviously on the wrap some drainage as well this contracted gnats or otherwise. Either way I do not see anything that appears to be doing worse in my pinion and in fact I think his drainage has slowed down quite significantly likely mainly due to the fact to his infection being under better control. 06/02/2019 on evaluation today patient actually appears to be doing well with regard to his bilateral lower extremities there is no signs of active infection at this time which is great news. With that being said he does have several open areas more so on the right than the left but nonetheless these are all significantly better than previously noted. 06/09/2019 on evaluation today patient actually appears to be doing well. His wrap stayed up and he did not cause any problems he had more drainage on the right compared to the left but overall I do not see any major issues at this time which is great news. 06/16/2019 on evaluation today patient appears to be  doing excellent with regard to his lower extremities the only area that is open is a new blister that can have opened as of today on the medial ankle on the left. Other than this he really seems to be doing great I see no major issues at this point. 06/23/2019 on evaluation today patient appears to be doing quite well with regard to his bilateral lower extremities. In fact he actually appears to be almost completely healed there is a small area of weeping noted of the right lower extremity just above the ankle. Nonetheless fortunately there is no signs of active infection at this time which is good news. No fevers, chills, nausea, vomiting, or diarrhea. 8/24; the patient arrived for a nurse visit today but complained of very significant pain in the left leg and therefore I was asked to look at this. Noted that he did not have an open area on the left leg last week nevertheless this was wrapped.  The patient states that he is not been able to put his compression pumps on the left leg because of the discomfort. He has not been systemically unwell 06/30/2019 on evaluation today patient unfortunately despite being excellent last week is doing much worse with regard to his left lower extremity today. In fact he had to come in for a nurse on Monday where his left leg had to be rewrapped due to excessive weeping Dr. Leanord Hawking placed him on doxycycline at that point. Fortunately there is no signs of active infection Systemically at this time which is good news. 07/07/2019 in regard to the patient's wounds today he actually seems to be doing well with his right lower extremity there really is nothing open or draining at this point this is great news. Unfortunately the left lower extremity is given him additional trouble at this time. There does not appear to be any signs of active infection nonetheless he does have a lot of edema and swelling noted at this point as well as blistering all of which has led to a much more poor appearing leg at this time compared to where it was 2 weeks ago when it was almost completely healed. Obviously this is a little discouraging for the patient. He is try to contact the lymphedema clinic in [place] he has not been able to get through to them. 07/14/2019 on evaluation today patient actually appears to be doing slightly better with regard to his left lower extremity ulcers. Overall I do feel like at least at the top of the wrap that we have been placing this area has healed quite nicely and looks much better. The remainder of the leg is showing signs of improvement. Unfortunately in the thigh area he still has an open region on the left and again on the right he has been utilizing just a Band-Aid on an area that also opened on the thigh. Again this is an area that were not able to wrap although we did do an Ace wrap to provide some compression that something that  obviously is a little less effective than the compression wraps we have been using on the lower portion of the leg. He does have an appointment with the lymphedema clinic in [facility] on Friday. 07/21/2019 on evaluation today patient appears to be doing better with regard to his lower extremity ulcers. He has been tolerating the dressing changes without complication. Fortunately there is no signs of active infection at this time. No fevers, chills, nausea, vomiting, or diarrhea. I did receive  the paperwork from the physical therapist at the lymphedema clinic in [place]. Subsequently I signed off on that this morning and sent that back to him for further progression with the treatment plan. 07/28/2019 on evaluation today patient appears to be doing very well with regard to his right lower extremity where I do not see any open wounds at this point. Fortunately he is feeling great as far as that is concerned as well. In regard to the left lower extremity he has been having issues with still several areas of weeping and edema although the upper leg is doing better his lower leg still I think is going require the compression wrap at this time. No fevers, chills, nausea, vomiting, or diarrhea. 08/04/2019 on evaluation today patient unfortunately is having new wounds on the right lower extremity. Again we have been using Unna boot wrap on that side. We switched him to using his juxta lite wrap at home. With that being said he tells me he has been using it although his legs extremely swollen and to be honest really does not appear that he has been. I cannot know that for sure however. Nonetheless he has multiple new wounds on the right lower extremity at this time. Obviously we will have to see about getting this rewrapped for him today. 08/11/2019 on evaluation today patient appears to be doing fairly well with regard to his wounds. He has been tolerating the dressing changes including the  compression wraps without complication. He still has a lot of edema in his upper thigh regions bilaterally he is supposed to be seeing the lymphedema clinic on the 15th of this month once his wraps arrive for the upper part of his legs. 08/18/2019 on evaluation today patient appears to be doing well with regard to his bilateral lower extremities at this point. He has been tolerating the dressing changes without complication. Fortunately there is no signs of active infection which is also good news. He does have a couple weeping areas on the first and second toe of the right foot he also has just a small area on the left foot upper leg and a small area on the left lower leg but overall he is doing quite well in my opinion. He is supposed to be getting his wraps shortly in fact tomorrow and then subsequently is seeing the lymphedema clinic next Wednesday on the 21st. Of note he is also leaving on the 25th to go on vacation for a week to the beach. For that reason and since there is some uncertainty about what there can be doing at lymphedema clinic next Wednesday I am get a make an appointment for next Friday here for [place] to see what we need to do for him prior to him leaving for vacation. 10/23; patient arrives in considerable pain predominantly in the upper posterior calf just distal to the popliteal fossa also in the wound anteriorly above the major wound. This is probably cellulitis and he has had this recurrently in the past. He has no open wound on the right side and he has had an Radio broadcast assistant in that area. Finally I note that he has an area on the left posterior calf which by enlarge is mostly epithelialized. This protrudes beyond the borders of the surrounding skin in the setting of dry scaly skin and lymphedema. The patient is leaving for [facility] on Sunday. Per his longstanding pattern, he will not take his compression pumps with him predominantly out of fear that they  will be stolen. He  therefore asked that we put a Unna boot back on the right leg. He will also contact the wound care center in [facility] to see if they can change his dressing in the mid week. 11/3; patient returned from his vacation to [facility]. He was seen on 1 occasion at their wound care center. They did a 2 layer compression system as they did not have our 4-layer wrap. I am not completely certain what they put on the wounds. They did not change the Unna boot on the right. The patient is also seeing a lymphedema specialist physical therapist in [place]. It appears that he has some compression sleeve for his thighs which indeed look quite a bit better than I am used to seeing. He pumps over these with his external compression pumps. 11/10; the patient has a new wound on the right medial thigh otherwise there is no open areas on the right. He has an area on the left leg posteriorly anteriorly and medially and an area over the left second toe. We have been using silver alginate. He thinks the injury on his thigh is secondary to friction from the compression sleeve he has. 11/17; the patient has a new wound on the right medial thigh last week. He thinks this is because he did not have a underlying stocking for his thigh juxta lite apparatus. He now has this. The area is fairly large and somewhat angry but I do not think he has underlying cellulitis. He has a intact blister on the right anterior tibial area. Small wound on the right great toe dorsally Small area on the medial left calf. 11/30; the patient does not have any open areas on his right leg and we did not take his juxta lite stocking off. However he states that on Friday his compression wrap fell down lodging around his upper mid calf area. As usual this creates a lot of problems for him. He called urgently today to be seen for a nurse visit however the nurse visit turned into a provider visit because of extreme erythema and pain in the  left anterior tibia extending laterally and posteriorly. The area that is problematic is extensive 10/06/2019 upon evaluation today patient actually appears to be doing poorly in regard to his left lower extremity. He Dr. Leanord Hawking did place him on doxycycline this past Monday apparently due to the fact that he was doing much worse in regard to this left leg. Fortunately the doxycycline does seem to be helping. Unfortunately we are still having a very difficult time getting his edema under any type of control in order to anticipate discharge at some point. The only way were really able to control his lymphedema really is with compression wraps and that has only even seemingly temporary. He has been seeing a lymphedema clinic they are trying to help in this regard but still this has been somewhat frustrating in general for the patient. 10/13/19 on evaluation today patient appears to be doing excellent with regard to his right lower extremity as far as the wounds are concerned. His swelling is still quite extensive unfortunately. He is still having a lot of drainage from the thigh areas bilaterally which is unfortunate. He's been going to lymphedema clinic but again he still really does not have this edema under control as far as his lower extremities are concern. With regard to his left lower extremity this seems to be improving and I do believe the doxycycline has been  of benefit for him. He is about to complete the doxycycline. 10/20/2019 on evaluation today patient appears to be doing poorly in regard to his bilateral lower extremities. More in the right thigh he has a lot of irritation at this site unfortunately. In regard to the left lower extremity the wrap was not quite as high it appears and does seem to have caused him some trouble as well. Fortunately there is no evidence of systemic infection though he does have some blue-green drainage which has me concerned for the possibility of  Pseudomonas. He tells me he is previously taking Cipro without complications and he really does not care for Levaquin however due to some of the side effects he has. He is not allergic to any medications specifically antibiotics that were aware of. 10/27/2019 on evaluation today patient actually does appear to be for the most part doing better when compared to last week's evaluation. With that being said he still has multiple open wounds over the bilateral lower extremities. He actually forgot to start taking the Cipro and states that he still has the whole bottle. He does have several new blisters on left lower extremity today I think I would recommend he go ahead and take the Cipro based on what I am seeing at this point. 12/30-Patient comes at 1 week visit, 4 layer compression wraps on the left and Unna boot on the right, primary dressing Xtrasorb and silver alginate. Patient is taking his Cipro and has a few more days left probably 5-6, and the legs are doing better. He states he is using his compressions devices which I believe he has 11/10/2019 on evaluation today patient actually appears to be much better than last time I saw him 2 weeks ago. His wounds are significantly improved and overall I am very pleased in this regard. Fortunately there is no signs of active infection at this time. He is just a couple of days away from completing Cipro. Overall his edema is much better he has been using his lymphedema pumps which I think is also helping at this point. 11/17/2019 on evaluation today patient appears to be doing excellent in regard to his wounds in general. His legs are swollen but not nearly as much as they have been in the past. Fortunately he is tolerating the compression wraps without complication. No fevers, chills, nausea, vomiting, or diarrhea. He does have some erythema however in the distal portion of his right lower extremity specifically around the forefoot and toes there is a  little bit of warmth here as well. 11/24/2019 on evaluation today patient appears to be doing well with regard to his right lower extremity I really do not see any open wounds at this point. His left lower extremity does have several open areas and his right medial thigh also is open. Other than this however overall the patient seems to be making good progress and I am very pleased at this point. 12/01/2019 on evaluation today patient appears to be doing poorly at this point in regard to his left lower extremity has several new blisters despite the fact that we have him in compression wraps. In fact he had a 4-layer compression wrap, his upper thigh wrapped from lymphedema clinic, and a juxta light over top of the 4 layer compression wrap the lymphedema clinic applied and despite all this he still develop blisters underneath. Obviously this does have me concerned about the fact that unfortunately despite what we are doing to try to get wounds  healed he continues to have new areas arise I do not think he is ever good to be at the point where he can realistically just use wraps at home to keep things under control. Typically when we heal him it takes about 1-2 days before he is back in the clinic with severe breakdown and blistering of his lower extremities bilaterally. This is happened numerous times in the past. Unfortunately I think that we may need some help as far as overall fluid overload to kind of limit what we are seeing and get things under better control. 12/08/2019 on evaluation today patient presents for follow-up concerning his ongoing bilateral lower extremity edema. Unfortunately he is still having quite a bit of swelling the compression wraps are controlling this to some degree but he did see Dr. [name] his cardiologist I do have that available for review today as far as the appointment was concerned that was on 12/06/2019. Obviously that she has been 2 days ago. The patient states that he  is only been taking the Lasix 80 mg 1 time a day he had told me previously he was taking this twice a day. Nonetheless Dr. [name] recommended this be up to 80 mg 2 times a day for the patient as he did appear to be fluid overloaded. With that being said the patient states he did this yesterday and he was unable to go anywhere or do anything due to the fact that he was constantly having to urinate. Nonetheless I think that this is still good to be something that is important for him as far as trying to get his edema under control at all things that he is going to be able to just expect his wounds to get under control and things to be better without going through at least a period of time where he is trying to stabilize his fluid management in general and I think increasing the Lasix is likely the first step here. It was also mentioned the possibility that the patient may require metolazone. With that being said he wanted to have the patient take Lasix twice a day first and then reevaluating 2 months to see where things stand. 12/15/2019 upon evaluation today patient appears to be doing regard to his legs although his toes are showing some signs of weeping especially on the left at this point to some degree on the right. There does not appear to be any signs of active infection and overall I do feel like the compression wraps are doing well for him but he has not been able to take the Lasix at home and the increased dose that Dr. [name] recommended. He tells me that just not go to be feasible for him. Nonetheless I think in this case he should probably send a message to Dr. [name] in order to discuss options from the standpoint of possible admission to get the fluid off or otherwise going forward. 12/22/2019 upon evaluation today patient appears to be doing fairly well with regard to his lower extremities at this point. In fact he would be doing excellent if it was not for the fact that his right anterior  thigh apparently had an allergic reaction to adhesive tape that he used. The wound itself that we have been monitoring actually appears to be healed. There is a lot of irritation at this point. 12/29/2019 upon evaluation today patient appears to be doing well in regard to his lower extremities. His left medial thigh is open and somewhat draining  today but this is the only region that is open the right has done much better with the treatment utilizing the steroid cream that I prescribed for him last week. Overall I am pleased in that regard. Fortunately there is no signs of active infection at this time. No fevers, chills, nausea, vomiting, or diarrhea. 01/05/2020 upon evaluation today patient appears to be doing more poorly in regard to his right lower extremity at this point upon evaluation today. Unfortunately he continues to have issues in this regard and I think the biggest issue is controlling his edema. This obviously is not very well controlled at this point is been recommended that he use the Lasix twice a day but he has not been able to do that. Unfortunately I think this is leading to an issue where honestly he is not really able to effectively control his edema and therefore the wounds really are not doing significantly better. I do not think that he is going to be able to keep things under good control unless he is able to control his edema much better. I discussed this again in great detail with him today. 01/12/2020 good news is patient actually appears to be doing quite well today at this point. He does have an appointment with lymphedema clinic tomorrow. His legs appear healed and the toe on the left is almost completely healed. In general I am very pleased with how things stand at this point. 01/19/2020 upon evaluation today patient appears to actually be doing well in regard to his lower extremities there is nothing open at this point. Fortunately he has done extremely well more  recently. Has been seeing lymphedema clinic as well. With that being said he has Velcro wraps for his lower legs as well as his upper legs. The only wound really is on his toe which is the right great toe and this is barely anything even there. With all that being said I think it is good to be appropriate today to go ahead and switch him over to the Velcro compression wraps. 01/26/2020 upon evaluation today patient appears to be doing worse with regard to his lower extremities after last week switch him to Velcro compression wraps. Unfortunately he lasted less than 24 hours he did not have the sock portion of his Velcro wrap on the left leg and subsequently developed a blister underneath the Velcro portion. Obviously this is not good and not what we were looking for at this point. He states the lymphedema clinic did tell him to wear the wrap for 23 hours and take him off for 1 I am okay with that plan but again right now we got a get things back under control again he may have some cellulitis noted as well. 02/02/2020 upon evaluation today patient unfortunately appears to have several areas of blistering on his bilateral lower extremities today mainly on the feet. His legs do seem to be doing somewhat better which is good news. Fortunately there is no evidence of active infection at this time. No fevers, chills, nausea, vomiting, or diarrhea. 02/16/2020 upon evaluation today patient appears to be doing well at this time with regard to his legs. He has a couple weeping areas on his toes but for the most part everything is doing better and does appear to be sealed up on his legs which is excellent news. We can continue with wrapping him at this point as he had every time we discontinue the wraps he just breaks out with new  wounds. There is really no point in is going forward with this at this point. Electronic Signature(s) Signed: 02/16/2020 1:45:36 PM By: Lenda Kelp PA-C Entered By: Lenda Kelp on 02/16/2020 13:45:36 -------------------------------------------------------------------------------- Physical Exam Details Patient Name: Date of Service: RIELEY, HAUSMAN 02/16/2020 10:45 AM Medical Record [id number] Patient Account Number: [id number] Date of Birth/Sex: Treating RN: 07/25/1951 (69 y.o. [name] Primary Care Provider: Nicoletta Ba Other Clinician: Referring Provider: Treating Provider/Extender:[name], [name], [name] in Treatment: 212 Constitutional Well-nourished and well-hydrated in no acute distress. Respiratory normal breathing without difficulty. Psychiatric this patient is able to make decisions and demonstrates good insight into disease process. Alert and Oriented x 3. pleasant and cooperative. Notes Upon inspection patient's legs show no signs of breakdown at this point time. There is no evidence of active infection and overall the patient seems to be doing quite well which is good news. Electronic Signature(s) Signed: 02/16/2020 1:45:53 PM By: Lenda Kelp PA-C Entered By: Lenda Kelp on 02/16/2020 13:45:53 -------------------------------------------------------------------------------- Physician Orders Details Patient Name: Date of Service: HAYLEN, SHELNUTT 02/16/2020 10:45 AM Medical Record [id number] Patient Account Number: [id number] Date of Birth/Sex: Treating RN: 10-Aug-1951 (69 y.o. 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 - with Leonard Schwartz Nurse Visit: - 1 week Dressing Change Frequency Do not change entire dressing for one week. - both legs Skin Barriers/Peri-Wound Care Moisturizing lotion - to legs Wound Cleansing Wound #162 Right Toe Second May shower and wash wound with soap and water. Primary Wound Dressing Wound #162 Right Toe Second Calcium Alginate with Silver Wound #165 Left Toe Great Calcium Alginate with Silver Secondary Dressing Wound #162 Right Toe Second Kerlix/Rolled Gauze Dry Gauze Edema Control 4 layer compression: Left lower extremity Unna Boot to Right Lower Extremity Avoid standing for long periods of time Elevate legs to the level of the heart or above for 30 minutes daily and/or when sitting, a frequency of: - whenever sitting Exercise regularly Segmental Compressive Device. - lymphadema pumps 60 minutes 2 times per day Other: - If feet or wraps become wet, remove wrap and apply juxtalites Additional Orders / Instructions Other: - take lasix as prescribed by cardiologist Electronic Signature(s) Signed: 02/16/2020 4:44:47 PM By: Lenda Kelp PA-C Signed: 02/16/2020 5:43:24 PM By: Zenaida Deed RN, BSN Entered By: Zenaida Deed on 02/16/2020 12:00:35 -------------------------------------------------------------------------------- Problem List Details Patient Name: Date of Service: [name], [name] 02/16/2020 10:45 AM Medical Record [id number] Patient Account Number: [id number] Date of Birth/Sex: Treating RN: [birth year] (69 y.o. [name] Primary Care Provider: Nicoletta Ba Other Clinician: Referring Provider: Treating Provider/Extender:[name], [name], [name] in Treatment: 212 Active  Problems ICD-10 Evaluated Encounter Code Description Active Date Today Diagnosis L97.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 right calf limited to 06/30/2018 No Yes breakdown of skin L97.221 Non-pressure chronic ulcer of left calf limited to 09/30/2016 No Yes breakdown of skin I87.333 Chronic venous hypertension (  idiopathic) with ulcer 01/22/2016 No Yes and inflammation of bilateral lower extremity I89.0 Lymphedema, not elsewhere classified 01/22/2016 No Yes E11.622 Type 2 diabetes mellitus with other skin ulcer 01/22/2016 No Yes 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, 01/22/2016 No Yes unspecified L03.116 Cellulitis of left lower limb 04/01/2017 No Yes Inactive Problems ICD-10 Code Description Active Date Inactive Date 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 06/30/2017 skin L97.521 Non-pressure chronic ulcer of other part of left foot limited to 04/27/2018 04/27/2018 breakdown of skin L03.115 Cellulitis of right lower limb 12/22/2017 12/22/2017 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 with other specified 06/30/2018 06/30/2018 severity L97.511 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 06/30/2018 06/30/2018 breakdown of skin Resolved Problems Electronic Signature(s) Signed: 02/16/2020 11:12:23 AM By: Lenda Kelp PA-C Entered By: Lenda Kelp on 02/16/2020 11:12:21 -------------------------------------------------------------------------------- Progress Note Details Patient Name: Date of Service: [name], [name] 02/16/2020 10:45 AM Medical Record [id number] Patient Account Number: [id number] Date of Birth/Sex: Treating RN: May 26, 1951 (69 y.o. [name] Primary Care Provider: Nicoletta Ba Other Clinician: Referring Provider: Treating Provider/Extender:[name], [name], [name] in Treatment: 212 Subjective Chief Complaint Information obtained from Patient patient is  here for evaluation venous/lymphedema weeping History of Present Illness (HPI) Referred by PCP for consultation. Patient has long standing history of BLE venous stasis, no prior ulcerations. At beginning of month, developed cellulitis and weeping. Received IM Rocephin followed by Keflex and resolved. Wears compression stocking, appr 6 months old. Not sure strength. No present drainage. 01/22/16 this is a patient who is a type II diabetic on insulin. 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 He tells me he religiously wears pressure stockings of uncertain strength. He was here with weeping edema about 8 months ago but did not have an open wound. Roughly a month ago he had a reopening on his bilateral legs. He is been using bandages and Neosporin. He does not complain of pain. 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 Last BUN/creatinine I have is from 11/20/15 at 13 and 1.0 respectively 01/29/16; patient arrives today having tolerated the Profore wrap. He brought in his stockings and these are 18 mmHg stockings he bought from [place]. The compression here is likely inadequate. He does not complain of pain or excessive drainage she has no systemic symptoms. The wound on the right looks improved as does the one on the left although one on the left is more substantial with still tissue at risk below the actual wound area on the bilateral posterior calf 02/05/16; patient arrives with poor edema control. He states that we did put a 4 layer compression on it last week. No weight appear 5 this. 02/12/16; the area on the posterior right Has healed. The left Has a substantial wound that has necrotic surface eschar that requires a debridement with a curette. 02/16/16;the patient called or a Nurse visit secondary to increased swelling. He had been in earlier in the week  with his right leg healed. He was transitioned to is on pressure stocking on the right leg with the only open wound on the left, a substantial area on the left posterior calf. Note he has a history of severe lower extremity edema, he has a history of chronic atrial fibrillation but not heart failure per my notes but I'll need  to research this. He is not complaining of chest pain shortness of breath or orthopnea. The intake nurse noted blisters on the previously closed right leg 02/19/16; this is the patient's regular visit day. I see him on Friday with escalating edema new wounds on the right leg and clear signs of at least right ventricular heart failure. I increased his Lasix to 40 twice a day. He is returning currently in follow-up. States he is noticed a decrease in that the edema 02/26/16 patient's legs have much less edema. There is nothing really open on the right leg. The left leg has improved condition of the large superficial wound on the posterior left leg 03/04/16; edema control is very much better. The patient's right leg wounds have healed. On the left leg he continues to have severe venous inflammation on the posterior aspect of the left leg. There is no tenderness and I don't think any of this is cellulitis. 03/11/16; patient's right leg is married healed and he is in his own stocking. The patient's left leg has deteriorated somewhat. There is a lot of erythema around the wound on the posterior left leg. There is also a significant rim of erythema posteriorly just above where the wrap would've ended there is a new wound in this location and a lot of tenderness. Can't rule out cellulitis in this area. 03/15/16; patient's right leg remains healed and he is in his own stocking. The patient's left leg is much better than last review. His major wound on the posterior aspect of his left Is almost fully epithelialized. He has 3 small injuries from the wraps. Really. Erythema seems a lot better  on antibiotics 03/18/16; right leg remains healed and he is in his own stocking. The patient's left leg is much better. The area on the posterior aspect of the left calf is fully epithelialized. His 3 small injuries which were wrap injuries on the left are improved only one seems still open his erythema has resolved 03/25/16; patient's right leg remains healed and he is in his own stocking. There is no open area today on the left leg posterior leg is completely closed up. His wrap injuries at the superior aspect of his leg are also resolved. He looks as though he has some irritation on the dorsal ankle but this is fully epithelialized without evidence of infection. 03/28/16; we discharged this patient on Monday. Transitioned him into his own stocking. There were problems almost immediately with uncontrolled swelling weeping edema multiple some of which have opened. He does not feel systemically unwell in particular no chest pain no shortness of breath and he does not feel 04/08/16; the edema is under better control with the Profore light wrap but he still has pitting edema. There is one large wound anteriorly 2 on the medial aspect of his left leg and 3 small areas on the superior posterior calf. Drainage is not excessive he is tolerating a Profore light well 04/15/16; put a Profore wrap on him last week. This is controlled is edema however he had a lot of pain on his left anterior foot most of his wounds are healed 04/22/16 once again the patient has denuded areas on the left anterior foot which he states are because his wrap slips up word. He saw his primary physician today is on Lasix 40 twice a day and states that he his weight is down 20 pounds over the last 3 months. 04/29/16: Much improved. left anterior foot much improved. He is now on  Lasix 80 mg per day. Much improved edema control 05/06/16; I was hoping to be able to discharge him today however once again he has blisters at a low level of  where the compression was placed last week mostly on his left lateral but also his left medial leg and a small area on the anterior part of the left foot. 05/09/16; apparently the patient went home after his appointment on 7/4 later in the evening developing pain in his upper medial thigh together with subjective fever and chills although his temperature was not taken. The pain was so intense he felt he would probably have to call 911. However he then remembered that he had leftover doxycycline from a previous round of antibiotics and took these. By the next morning he felt a lot better. He called and spoke to one of our nurses and I approved doxycycline over the phone thinking that this was in relation to the wounds we had previously seen although they were definitely were not. The patient feels a lot better old fever no chills he is still working. Blood sugars are reasonably controlled 05/13/16; patient is back in for review of his cellulitis on his anterior medial upper thigh. He is taking doxycycline this is a lot better. Culture I did of the nodular area on the dorsal aspect of his foot grew MRSA this also looks a lot better. 05/20/16; the patient is cellulitis on the medial upper thigh has resolved. All of his wound areas including the left anterior foot, areas on the medial aspect of the left calf and the lateral aspect of the calf at all resolved. He has a new blister on the left dorsal foot at the level of the fourth toe this was excised. No evidence of infection 05/27/16; patient continues to complain weeping edema. He has new blisterlike wounds on the left anterior lateral and posterior lateral calf at the top of his wrap levels. The area on his left anterior foot appears better. He is not complaining of fever, pain or pruritus in his feet. 05/30/16; the patient's blisters on his left anterior leg posterior calf all look improved. He did not increase the Lasix 100 mg as I suggested because  he was going to run out of his 40 mg tablets. He is still having weeping edema of his toes 06/03/16; I renewed his Lasix at 80 mg once a day as he was about to run out when I last saw him. He is on 80 mg of Lasix now. I have asked him to cut down on the excessive amount of water he was drinking and asked him to drink according to his thirst mechanisms 06/12/2016 -- was seen 2 days ago and was supposed to wear his compression stockings at home but he is developed lymphedema and superficial blisters on the left lower extremity and hence came in for a review 06/24/16; the remaining wound is on his left anterior leg. He still has edema coming from between his toes. There is lymphedema here however his edema is generally better than when I last saw this. He has a history of atrial fibrillation but does not have a known history of congestive heart failure nevertheless I think he probably has this at least on a diastolic basis. 07/01/16 I reviewed his echocardiogram from January 2017. This was essentially normal. He did not have LVH, EF of 55-60%. His right ventricular function was normal although he did have trivial tricuspid and pulmonic regurgitation. This is not audible on exam  however. I increased his Lasix to do massive edema in his legs well above his knees I think in early July. He was also drinking an excessive amount of water at the time. 07/15/16; missed his appointment last week because of the Labor Day holiday on Monday. He could not get another appointment later in the week. Started to feel the wrap digging in superiorly so we remove the top half and the bottom half of his wrap. He has extensive erythema and blistering superiorly in the left leg. Very tender. Very swollen. Edema in his foot with leaking edema fluid. He has not been systemically unwell 07/22/16; the area on the left leg laterally required some debridement. The medial wounds look more stable. His wrap injury wounds appear to have  healed. Edema and his foot is better, weeping edema is also better. He tells me he is meeting with the supplier of the external compression pumps at work 08/05/16; the patient was on vacation last week in [facility]. His wrap is been on for an extended period of time. Also over the weekend he developed an extensive area of tender erythema across his anterior medial thigh. He took to doxycycline yesterday that he had leftover from a previous prescription. The patient complains of weeping edema coming out of his toes 08/08/16; I saw this patient on 10/2. He was tender across his anterior thigh. I put him on doxycycline. He returns today in follow-up. He does not have any open wounds on his lower leg, he still has edema weeping into his toes. 08/12/16; patient was seen back urgently today to follow-up for his extensive left thigh cellulitis/erysipelas. He comes back with a lot less swelling and erythema pain is much better. I believe I gave him Augmentin and Cipro. His wrap was cut down as he stated a roll down his legs. He developed blistering above the level of the wrap that remained. He has 2 open blisters and 1 intact. 08/19/16; patient is been doing his primary doctor who is increased his Lasix from 40-80 once a day or 80 already has less edema. Cellulitis has remained improved in the left thigh. 2 open areas on the posterior left calf 08/26/16; he returns today having new open blisters on the anterior part of his left leg. He has his compression pumps but is not yet been shown how to use some vital representative from the supplier. 09/02/16 patient returns today with no open wounds on the left leg. Some maceration in his plantar toes 09/10/2016 -- Dr. Leanord Hawking had recently discharged him on 09/02/2016 and he has come right back with redness swelling and some open ulcers on his left lower extremity. He says this was caused by trying to apply his compression stockings and he's been unable to use  this and has not been able to use his lymphedema pumps. He had some doxycycline leftover and he has started on this a few days ago. 09/16/16; there are no open wounds on his leg on the left and no evidence of cellulitis. 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 He does not complain of symptoms here. I am not clear use using his external compression pumps. 09/23/16; I have not seen this patient in 2 weeks. He canceled his appointment 10 days ago as he was going on vacation. He tells me that on Monday he noticed a large area on his posterior left leg which is been draining copiously and is reopened into  a large wound. He is been using ABDs and the external part of his juxtalite, according to our nurse this was not on properly. 10/07/16; Still a substantial area on the posterior left leg. Using silver alginate 10/14/16; in general better although there is still open area which looks healthy. Still using silver alginate. He reminds me that this happen before he left for [facility]. Today while he was showering in the morning. He had been using his juxtalite's 10/21/16; the area on his posterior left leg is fully epithelialized. However he arrives today with a large area of tender erythema in his medial and posterior left thigh just above the knee. I have marked the area. Once again he is reluctant to consider hospitalization. I treated him with oral antibiotics in the past for a similar situation with resolution I think with doxycycline however this area it seems more extensive to me. He is not complaining of fever but does have chills and says states he is thirsty. His blood sugar today was in the 140s at home 10/25/16 the area on his posterior left leg is fully epithelialized although there is still some weeping edema. The large area of tenderness and erythema in his medial and posterior left thigh is a lot less tender although there is  still a lot of swelling in this thigh. He states he feels a lot better. He is on doxycycline and Augmentin that I started last week. This will continued until Tuesday, December 26. I have ordered a duplex ultrasound of the left thigh rule out DVT whether there is an abscess something that would need to be drained I would also like to know. 11/01/16; he still has weeping edema from a not fully epithelialized area on his left posterior calf. Most of the rest of this looks a lot better. He has completed his antibiotics. His thigh is a lot better. Duplex ultrasound did not show a DVT in the thigh 11/08/16; he comes in today with more Denuded surface epithelium from the posterior aspect of his calf. There is no real evidence of cellulitis. The superior aspect of his wrap appears to have put quite an indentation in his leg just below the knee and this may have contributed. He does not complain of pain or fever. We have been using silver alginate as the primary dressing. The area of cellulitis in the right thigh has totally resolved. He has been using his compression stockings once a week 11/15/16; the patient arrives today with more loss of epithelium from the posterior aspect of his left calf. He now has a fairly substantial wound in this area. The reason behind this deterioration isn't exactly clear although his edema is not well controlled. He states he feels he is generally more swollen systemically. He is not complaining of chest pain shortness of breath fever. Tells me he has an appointment with his primary physician in early February. He is on 80 mg of oral Lasix a day. He claims compliance with the external compression pumps. He is not having any pain in his legs similar to what he has with his recurrent cellulitis 11/22/16; the patient arrives a follow-up of his large area on his left lateral calf. This looks somewhat better today. He came in earlier in the week for a dressing change since I saw  him a week ago. He is not complaining of any pain no shortness of breath no chest pain 11/28/16; the patient arrives for follow-up of his large area on the left  lateral calf this does not look better. In fact it is larger weeping edema. The surface of the wound does not look too bad. We have been using silver alginate although I'm not certain that this is a dressing issue. 12/05/16; again the patient follows up for a large wound on the left lateral and left posterior calf this does not look better. There continues to be weeping edema necrotic surface tissue. More worrisome than this once again there is erythema below the wound involving the distal Achilles and heel suggestive of cellulitis. He is on his feet working most of the day of this is not going well. We are changing his dressing twice a week to facilitate the drainage. 12/12/16; not much change in the overall dimensions of the large area on the left posterior calf. This is very inflamed looking. I gave him an. Doxycycline last week does not really seem to have helped. He found the wrap very painful indeed it seems to of dog into his legs superiorly and perhaps around the heel. He came in early today because the drainage had soaked through his dressings. 12/19/16- patient arrives for follow-up evaluation of his left lower extremity ulcers. He states that he is using his lymphedema pumps once daily when there is "no drainage". He admits to not using his lipedema pumps while under current treatment. His blood sugars have been consistently between 150-200. 12/26/16; the patient is not using his compression pumps at home because of the wetness on his feet. I've advised him that I think it's important for him to use this daily. He finds his feet too wet, he can put a plastic bag over his legs while he is in the pumps. Otherwise I think will be in a vicious circle. We are using silver alginate to the major area on his left posterior calf 01/02/17; the  patient's posterior left leg has further of all into 3 open wounds. All of them covered with a necrotic surface. He claims to be using his compression pumps once a day. His edema control is marginal. Continue with silver alginate 01/10/17; the patient's left posterior leg actually looks somewhat better. There is less edema, less erythema. Still has 3 open areas covered with a necrotic surface requiring debridement. He claims to be using his compression pumps once a day his edema control is better 01/17/17; the patient's left posterior calf look better last week when I saw him and his wrap was changed 2 days ago. He has noted increasing pain in the left heel and arrives today with much larger wounds extensive erythema extending down into the entire heel area especially tender medially. He is not systemically unwell CBGs have been controlled no fever. Our intake nurse showed me limegreen drainage on his AVD pads. 01/24/17; his usual this patient responds nicely to antibiotics last week giving him Levaquin for presumed Pseudomonas. The whole entire posterior part of his leg is much better much less inflamed and in the case of his Achilles heel area much less tender. He has also had some epithelialization posteriorly there are still open areas here and still draining but overall considerably better 01/31/17- He has continue to tolerate the compression wraps. he states that he continues to use the lymphedema pumps daily, and can increase to twice daily on the weekends. He is voicing no complaints or concerns regarding his LLE ulcers 02/07/17-he is here for follow-up evaluation. He states that he noted some erythema to the left medial and anterior thigh, which he states  is new as of yesterday. He is concerned about recurrent cellulitis. He states his blood sugars have been slightly elevated, this morning in the 180s 02/14/17; he is here for follow-up evaluation. When he was last here there was erythema  superiorly from his posterior wound in his anterior thigh. He was prescribed Levaquin however a culture of the wound surface grew MRSA over the phone I changed him to doxycycline on Monday and things seem to be a lot better. 02/24/17; patient missed his appointment on Friday therefore we changed his nurse visit into a physician visit today. Still using silver alginate on the large area of the posterior left thigh. He isn't new area on the dorsal left second toe 03/03/17; actually better today although he admits he has not used his external compression pumps in the last 2 days or so because of work responsibilities over the weekend. 03/10/17; continued improvement. External compression pumps once a day almost all of his wounds have closed on the posterior left calf. Better edema control 03/17/17; in general improved. He still has 3 small open areas on the lateral aspect of his left leg however most of the area on the posterior part of his leg is epithelialized. He has better edema control. He has an ABD pad under his stocking on the right anterior lower leg although he did not let us look at that today. 03/24/17; patient arrives back in clinic today with no open areas however there are areas on the posterior left calf and anterior left calf that are less than 100% epithelialized. His edema is well controlled in the left lower leg. There is some pitting edema probably lymphedema in the left upper thigh. He uses compression pumps at home once per day. I tried to get him to do this twice a day although he is very reticent. 04/01/2017 -- for the last 2 days he's had significant redness, tenderness and weeping and came in for an urgent visit today. 04/07/17; patient still has 6 more days of doxycycline. He was seen by Dr. Meyer Russel last Wednesday for cellulitis involving the posterior aspect, lateral aspect of his Involving his heel. For the most part he is better there is less erythema and less weeping. He has  been on his feet for 12 hours o2 over the weekend. Using his compression pumps once a day 04/14/17 arrives today with continued improvement. Only one area on the posterior left calf that is not fully epithelialized. He has intense bilateral venous inflammation associated with his chronic venous insufficiency disease and secondary lymphedema. We have been using silver alginate to the left posterior calf wound In passing he tells [place] today that the right leg but we have not seen in quite some time has an open area on it but he doesn't want [place] to look at this today states he will show this to [place] next week. 04/21/17; there is no open area on his left leg although he still reports some weeping edema. He showed [place] his right leg today which is the first time we've seen this leg in a long time. He has a large area of open wound on the right leg anteriorly healthy granulation. Quite a bit of swelling in the right leg and some degree of venous inflammation. He told us about the right leg in passing last week but states that deterioration in the right leg really only happened over the weekend 04/28/17; there is no open area on the left leg although there is an irritated part  on the posterior which is like a wrap injury. The wound on the right leg which was new from last week at least to us is a lot better. 05/05/17; still no open area on the left leg. Patient is using his new compression stocking which seems to be doing a good job of controlling the edema. He states he is using his compression pumps once per day. The right leg still has an open wound although it is better in terms of surface area. Required debridement. A lot of pain in the posterior right Achilles marked tenderness. Usually this type of presentation this patient gives concern for an active cellulitis 05/12/17; patient arrives today with his major wound from last week on the right lateral leg somewhat better. Still requiring debridement. He was  using his compression stocking on the left leg however that is reopened with superficial wounds anteriorly he did not have an open wound on this leg previously. He is still using his juxta light's once daily at night. He cannot find the time to do this in the morning as he has to be at work by 7 AM 05/19/17; right lateral leg wound looks improved. No debridement required. The concerning area is on the left posterior leg which appears to almost have a subcutaneous hemorrhagic component to it. We've been using silver alginate to all the wounds 05/26/17; the right lateral leg wound continues to look improved. However the area on the left posterior calf is a tightly adherent surface. [place] using silver alginate. Because of the weeping edema in his legs there is very little good alternatives. 06/02/17; the patient left here last week looking quite good. Major wound on the left posterior calf and a small one on the right lateral calf. Both of these look satisfactory. He tells me that by Wednesday he had noted increased pain in the left leg and drainage. He called on Thursday and Friday to get an appointment here but we were blocked. He did not go to urgent care or his primary physician. He thinks he had a fever on Thursday but did not actually take his temperature. He has not been using his compression pumps on the left leg because of pain. I advised him to go to the emergency room today for IV antibiotics for stents of left leg cellulitis but he has refused I have asked him to take 2 days off work to keep his leg elevated and he has refused this as well. In view of this I'm going to call him and Augmentin and doxycycline. 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  However after a detailed discussion regarding the risk benefits and alternatives he refuses to get admitted to the hospital. With no other recourse I will continue him on oral antibiotics as before and hopefully he'll have his infectious disease consultation this week. 06/16/2017 -- the patient was seen today by the nurse practitioner at infectious disease Ms. Dixon. 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 He has also been advise Lotrimin cream locally. she also had wise IV antibiotics for  his condition if it flares up 06/23/17; patient arrives today with drainage bilaterally although the remaining wound on the left posterior calf after cleaning up today "highlighter yellow drainage" did not look too bad. Unfortunately he has had breakdown on the right anterior leg [previously this leg had not been open and he is using a black stocking] he went to see infectious disease and is been put on clindamycin 150 mg daily, I did not verify the dose although I'm not familiar with using clindamycin in this dosing range, perhaps for prophylaxisoo 06/27/17; I brought this patient back today to follow-up on the wound deterioration on the right lower leg together with surrounding cellulitis. I started him on doxycycline 4 days ago. This area looks better however he comes in today with intense cellulitis on the medial part of his left thigh. This is not have a wound in this area. Extremely tender. We've been using silver alginate to the wounds on the right lower leg left lower leg with bilateral 4 layer compression he is using his external compression pumps once a day 07/04/17; patient's left medial thigh cellulitis looks better. He has not been using his compression pumps as his insert said it was contraindicated with cellulitis. His  right leg continues to make improvements all the wounds are still open. We only have one remaining wound on the left posterior calf. Using silver alginate to all open areas. He is on doxycycline which I started a week ago and should be finishing I gave him Augmentin after Thursday's visit for the severe cellulitis on the left medial thigh which fortunately looks better 07/14/17; the patient's left medial thigh cellulitis has resolved. The cellulitis in his right lower calf on the right also looks better. All of his wounds are stable to improved we've been using silver alginate he has completed the antibiotics I have given him. He has clindamycin 150 mg once a day prescribed by infectious disease for prophylaxis, I've advised him to start this now. We have been using bilateral Unna boots over silver alginate to the wound areas 07/21/17; the patient is been to see infectious disease who noted his recurrent problems with cellulitis. He was not able to tolerate prophylactic clindamycin therefore he is on amoxicillin 500 twice a day. He also had a second daily dose of Lasix added By Dr. Oneta Rack but he is not taking this. Nor is he being completely compliant with his compression pumps a especially not this week. He has 2 remaining wounds one on the right posterior lateral lower leg and one on the left posterior medial lower leg. 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 The patient has Unna boots bilaterally. 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 He has bilateral Unna boots for compression and silver alginate to his wounds. Arrives today with his legs looking as good as I have seen him in quite some time. Not surprisingly his wounds look better as well with improvement on the  right lateral leg venous insufficiency wound and also the left medial leg. He is still using the compression pumps once a day 08/11/17; both legs appear to be doing better wounds on the right lateral and left medial legs look better. Skin on the right leg quite good. He is been using silver alginate as the  primary dressing. I'm going to use Anasept gel calcium alginate and maintain all the secondary dressings 08/18/17; the patient continues to actually do quite well. The area on his right lateral leg is just about closed the left medial also looks better although it is still moist in this area. His edema is well controlled we have been using Anasept gel with calcium alginate and the usual secondary dressings, 4 layer compression and once daily use of his compression pumps "always been able to manage 09/01/17; the patient continues to do reasonably well in spite of his trip to [place]. The area on the right lateral leg is epithelialized. Left is much better but still open. He has more edema and more chronic erythema on the left leg [venous inflammation] 09/08/17; he arrives today with no open wound on the right lateral leg and decently controlled edema. Unfortunately his left leg is not nearly as in his good situation as last week.he apparently had increasing edema starting on Saturday. He edema soaked through into his foot so used a plastic bag to walk around his home. The area on the medial right leg which was his open area is about the same however he has lost surface epithelium on the left lateral which is new and he has significant pain in the Achilles area of the left foot. He is already on amoxicillin chronically for prophylaxis of cellulitis in the left leg 09/15/17; he is completed a week of doxycycline and the cellulitis in the left posterior leg and Achilles area is as usual improved. He still has a lot of edema and fluid soaking through his dressings. There is no open wound on  the right leg. He saw infectious disease NP today 09/22/17;As usual 1 we transition him from our compression wraps to his stockings things did not go well. He has several small open areas on the right leg. He states this was caused by the compression wrap on his skin although he did not wear this with the stockings over them. He has several superficial areas on the left leg medially laterally posteriorly. He does not have any evidence of active cellulitis especially involving the left Achilles The patient is traveling from [facility] Saturday going to [facility]. He states he isn't attempting to get an appointment with a heel objects wound center there to change his dressings. I am not completely certain whether this will work 10/06/17; the patient came in on Friday for a nurse visit and the nurse reported that his legs actually look quite good. He arrives in clinic today for his regular follow-up visit. He has a new wound on his left third toe over the PIP probably caused by friction with his footwear. He has small areas on the left leg and a very superficial but epithelialized area on the right anterior lateral lower leg. Other than that his legs look as good as I've seen him in quite some time. We have been using silver alginate Review of systems; no chest pain no shortness of breath other than this a 10 point review of systems negative 10/20/17; seen by Dr. Meyer Russel last week. He had taken some antibiotics [doxycycline] that he had left over. Dr. Meyer Russel thought he had candida infection and declined to give him further antibiotics. He has a small wound remaining on the right lateral leg several areas on the left leg including a larger area on the left posterior several left medial and anterior and a small wound on the left lateral. The area on  the left dorsal third toe looks a lot better. ROS; Gen.; no fever, respiratory no cough no sputum Cardiac no chest pain other than this 10 point review  of system is negative 10/30/17; patient arrives today having fallen in the bathtub 3 days ago. It took him a while to get up. He has pain and maceration in the wounds on his left leg which have deteriorated. He has not been using his pumps he also has some maceration on the right lateral leg. 11/03/17; patient continues to have weeping edema especially in the left leg. This saturates his dressings which were just put on on 12/27. As usual the doxycycline seems to take care of the cellulitis on his lower leg. He is not complaining of fever, chills, or other systemic symptoms. He states his leg feels a lot better on the doxycycline I gave him empirically. He also apparently gets injections at his primary doctor's officeo Rocephin for cellulitis prophylaxis. I didn't ask him about his compression pump compliance today I think that's probably marginal. Arrives in the clinic with all of his dressings primary and secondary macerated full of fluid and he has bilateral edema 11/10/17; the patient's right leg looks some better although there is still a cluster of wounds on the right lateral. The left leg is inflamed with almost circumferential skin loss medially to laterally although we are still maintaining anteriorly. He does not have overt cellulitis there is a lot of drainage. He is not using compression pumps. We have been using silver alginate to the wound areas, there are not a lot of options here 11/17/17; the patient's right leg continues to be stable although there is still open wounds, better than last week. The inflammation in the left leg is better. Still loss of surface layer epithelium especially posteriorly. There is no overt cellulitis in the amount of edema and his left leg is really quite good, tells me he is using his compression pumps once a day. 11/24/17; patient's right leg has a small superficial wound laterally this continues to improve. The inflammation in the left leg is still  improving however we have continuous surface layer epithelial loss posteriorly. There is no overt cellulitis in the amount of edema in both legs is really quite good. He states he is using his compression pumps on the left leg once a day for 5 out of 7 days 12/01/17; very small superficial areas on the right lateral leg continue to improve. Edema control in both legs is better today. He has continued loss of surface epithelialization and left posterior calf although I think this is better. We have been using silver alginate with large number of absorptive secondary dressings 4 layer on the left Unna boot on the right at his request. He tells me he is using his compression pumps once a day 12/08/17; he has no open area on the right leg is edema control is good here. ooOn the left leg however he has marked erythema and tenderness breakdown of skin. He has what appears to be a wrap injury just distal to the popliteal fossa. 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 There was swelling erythema and breakdown in the right leg. He has much worse skin breakdown  in the left leg as well multiple open areas medially and posteriorly as well as laterally. He tells me he has been using his compression pumps but tells me he feels that the drainage out of his leg is worse when he uses a compression pumps. To be fair to him he is been saying this for a while however I don't know that I have really been listening to this. I wonder if the compression pumps are working properly 12/22/17;. Once again he arrives with severe erythema, weeping edema from the left greater than right leg. Noncompliance with compression pumps. New this visit he is  complaining of pain on the lateral aspect of the right leg and the medial aspect of his right thigh. He apparently saw his cardiologist Dr. [name] who was ordered an echocardiogram area and I think this is a step in the right direction 12/25/17; started his doxycycline Monday night. There is still intense erythema of the right leg especially in the anterior thigh although there is less tenderness. The erythema around the wound on the right lateral calf also is less tender. He still complaining of pain in the left heel. His wounds are about the same right lateral left medial left lateral. Superficial but certainly not close to closure. He denies being systemically unwell no fever chills no abdominal pain no diarrhea 12/29/17; back in follow-up of his extensive right calf and right thigh cellulitis. I added amoxicillin to cover possible doxycycline resistant strep. This seems to of done the trick he is in much less pain there is much less erythema and swelling. He has his echocardiogram at 11:00 this morning. X-ray of the left heel was also negative. 01/05/18; the patient arrived with his edema under much better control. Now that he is retired he is able to use his compression pumps daily and sometimes twice a day per the patient. He has a wound on the right leg the lateral wound looks better. Area on the left leg also looks a lot better. He has no evidence of cellulitis in his bilateral thighs I had a quick peak at his echocardiogram. He is in normal ejection fraction and normal left ventricular function. He has moderate pulmonary hypertension moderately reduced right ventricular function. One would have to wonder about chronic sleep apnea although he says he doesn't snore. He'll review the echocardiogram with his cardiologist. 01/12/18; the patient arrives with the edema in both legs under exemplary control. He is using his compression pumps daily and sometimes twice daily. His wound on the right lateral  leg is just about closed. He still has some weeping areas on the posterior left calf and lateral left calf although everything is just about closed here as well. I have spoken with Aldean Baker who is the patient's nurse practitioner and infectious disease. She was concerned that the patient had not understood that the parenteral penicillin injections he was receiving for cellulitis prophylaxis was actually benefiting him. I don't think the patient actually saw that I would tend to agree we were certainly dealing with less infections although he had a serious one last month. 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 He is healed. He'll be placed in juxtalite compression wraps and increase his lymphedema pumps to twice daily. We will follow up again next week to ensure there are no issues with the new regiment. 01/20/18-he is here for evaluation of bilateral lower extremity weeping edema. Yesterday he was placed in compression wrap to the right  lower extremity and compression stocking to left lower shrubbery. He states he uses lymphedema pumps last night and again this morning and noted a blister to the left lower extremity. On exam he was noted to have drainage to the right lower extremity. He will be placed in Unna boots bilaterally and follow-up next week 01/26/18; patient was actually discharged a week ago to his own juxta light stockings only to return the next day with bilateral lower extremity weeping edema.he was placed in bilateral Unna boots. He arrives today with pain in the back of his left leg. There is no open area on the right leg however there is a linear/wrap injury on the left leg and weeping edema on the left leg posteriorly. I spoke with infectious disease about 10 days ago. They were disappointed that the patient elected to discontinue prophylactic intramuscular penicillin shots as they felt it was particularly beneficial in reducing the  frequency of his cellulitis. I discussed this with the patient today. He does not share this view. He'll definitely need antibiotics today. Finally he is traveling to [place] and trauma leaving this Saturday and returning a week later and he does not travel with his pumps. He is going by car 01/30/18; patient was seen 4 days ago and brought back in today for review of cellulitis in the left leg posteriorly. I put him on amoxicillin this really hasn't helped as much as I might like. He is also worried because he is traveling to [facility] trauma by car. Finally we will be rewrapping him. There is no open area on the right leg over his left leg has multiple weeping areas as usual 02/09/18; The same wrap on for 10 days. He did not pick up the last doxycycline I prescribed for him. He apparently took 4 days worth he already had. There is nothing open on his right leg and the edema control is really quite good. He's had damage in the left leg medially and laterally especially probably related to the prolonged use of Unna boots 02/12/18; the patient arrived in clinic today for a nurse visit/wrap change. He complained of a lot of pain in the left posterior calf. He is taking doxycycline that I previously prescribed for him. Unfortunately even though he used his stockings and apparently used to compression pumps twice a day he has weeping edema coming out of the lateral part of his right leg. This is coming from the lower anterior lateral skin area. 02/16/18; the patient has finished his doxycycline and will finish the amoxicillin 2 days. The area of cellulitis in the left calf posteriorly has resolved. He is no longer having any pain. He tells me he is using his compression pumps at least once a day sometimes twice. 02/23/18; the patient finished his doxycycline and Amoxil last week. On Friday he noticed a small erythematous circle about the size of a quarter on the left lower leg just above his ankle. This  rapidly expanded and he now has erythema on the lateral and posterior part of the thigh. This is bright red. Also has an area on the dorsal foot just above his toes and a tender area just below the left popliteal fossa. He came off his prophylactic penicillin injections at his own insistence one or 2 months ago. This is obviously deteriorated since then 03/02/18; patient is on doxycycline and Amoxil. Culture I did last week of the weeping area on the back of his left calf grew group B strep. I have therefore  renewed the amoxicillin 500 3 times a day for a further week. He has not been systemically unwell. Still complaining of an area of discomfort right under his left popliteal fossa. There is no open wound on the right leg. He tells me that he is using his pumps twice a day on most days 03/09/18; patient arrives in clinic today completing his amoxicillin today. The cellulitis on his left leg is better. Furthermore he tells me that he had intramuscular penicillin shots that his primary care office today. However he also states that the wrap on his right leg fell down shortly after leaving clinic last week. He developed a large blister that was present when he came in for a nurse visit later in the week and then he developed intense discomfort around this area.He tells me he is using his compression pumps 03/16/18; the patient has completed his doxycycline. The infectious part of this/cellulitis in the left heel area left popliteal area is a lot better. He has 2 open areas on the right calf. Still areas on the left calf but this is a lot better as well. 03/24/18; the patient arrives complaining of pain in the left popliteal area again. He thinks some of this is wrap injury. He has no open area on the right leg and really no open area on the left calf either except for the popliteal area. He claims to be compliant with the compression pumps 03/31/18; I gave him doxycycline last week because of cellulitis  in the left popliteal area. This is a lot better although the surface epithelium is denuded off and response to this. He arrives today with uncontrolled edema in the right calf area as well as a fingernail injury in the right lateral calf. There is only a few open areas on the left 04/06/18; I gave him amoxicillin doxycycline over the last 2 weeks that the amoxicillin should be completing currently. He is not complaining of any pain or systemic symptoms. The only open areas see has is on the right lateral lower leg paradoxically I cannot see anything on the left lower leg. He tells me he is using his compression pumps twice a day on most days. Silver alginate to the wounds that are open under 4 layer compression 04/13/18; he completed antibiotics and has no new complaints. Using his compression pumps. Silver alginate that anything that's opened 04/20/18; he is using his compression pumps religiously. Silver alginate 4 layer compression anything that's opened. He comes in today with no open wounds on the left leg but 3 on the right including a new one posteriorly. He has 2 on the right lateral and one on the right posterior. He likes Unna boots on the right leg for reasons that aren't really clear we had the usual 4 layer compression on the left. It may be necessary to move to the 4 layer compression on the right however for now I left them in the Unna boots 04/27/18; he is using his compression pumps at least once a day. He has still the wounds on the right lateral calf. The area right posteriorly has closed. He does not have an open wound on the left under 4 layer compression however on the dorsal left foot just proximal to the toes and the left third toe 2 small open areas were identified 05/11/18; he has not uses compression pumps. The areas on the right lateral calf have coalesced into one large wound necrotic surface. On the left side he has one small  wound anteriorly however the edema is now weeping  out of a large part of his left leg. He says he wasn't using his pumps because of the weeping fluid. I explained to him that this is the time he needs to pump more 05/18/18; patient states he is using his compression pumps twice a day. The area on the right lateral large wound albeit superficial. On the left side he has innumerable number of small new wounds on the left calf particularly laterally but several anteriorly and medially. All these appear to have healthy granulated base these look like the remnants of blisters however they occurred under compression. The patient arrives in clinic today with his legs somewhat better. There is certainly less edema, less multiple open areas on the left calf and the right anterior leg looks somewhat better as well superficial and a little smaller. However he relates pain and erythema over the last 3-4 days in the thigh and I looked at this today. He has not been systemically unwell no fever no chills no change in blood sugar values 05/25/18; comes in today in a better state. The severe cellulitis on his left leg seems better with the Keflex. Not as tender. He has not been systemically unwell ooHard to find an open wound on the left lower leg using his compression pumps twice a day ooThe confluent wounds on his right lateral calf somewhat better looking. These will ultimately need debridement I didn't do this today. 06/01/18; the severe cellulitis on the left anterior thigh has resolved and he is completed his Keflex. ooThere is no open wound on the left leg however there is a superficial excoriation at the base of the third toe dorsally. Skin on the bottom of his left foot is macerated looking. ooThe left the wounds on the lateral right leg actually looks some better although he did require debridement of the top half of this wound area with an open curet 06/09/18 on evaluation today patient appears to be doing poorly in regard to his right lower  extremity in particular this appears to likely be infected he has very thick purulent discharge along with a bright green tent to the discharge. This makes me concerned about the possibility of pseudomonas. He's also having increased discomfort at this point on evaluation. Fortunately there does not appear to be any evidence of infection spreading to the other location at this time. 06/16/18 on evaluation today patient appears to actually be doing fairly well. His ulcer has actually diminished in size quite significantly at this point which is good news. Nonetheless he still does have some evidence of infection he did see infectious disease this morning before coming here for his appointment. I did review the results of their evaluation and their note today. They did actually have him discontinue the Cipro and initiate treatment with linezolid at this time. He is doing this for the next seven days and they recommended a follow-up in four months with them. He is the keep a log of the need for intermittent antibiotic therapy between now and when he falls back up with infectious disease. This will help them gaze what exactly they need to do to try and help them out. 06/23/18; the patient arrives today with no open wounds on the left leg and left third toe healed. He is been using his compression pumps twice a day. On the right lateral leg he still has a sizable wound but this is a lot better than last time I saw  this. 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 Has been using silver alginate under 4 layer compression 06/30/18; the only open wound he has is on the right lateral leg and this looks healthy. No debridement is required. We have been using silver alginate. He does not have an open wound on the left leg. There is apparently some drainage from the dorsal proximal third toe on the left  although I see no open wound here. 07/03/18 on evaluation today patient was actually here just for a nurse visit rapid change. However when he was here on Wednesday for his rat change due to having been healed on the left and then developing blisters we initiated the wrap again knowing that he would be back today for [place] to reevaluate and see were at. Unfortunately he has developed some cellulitis into the proximal portion of his right lower extremity even into the region of his thigh. 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 He was placed on one as what at that point. Nonetheless he is done with that and has been tolerating it well otherwise. Doxycycline which in the past really did not seem to be effective for him. Nonetheless I think the best option may be for [place] to definitely reinitiate the antibiotics for a longer period of time. 07/07/18; since I last saw this patient a week ago he has had a difficult time. At that point he did not have an open wound on his left leg. We transitioned him into juxta light stockings. He was apparently in the clinic the next day with blisters on the left lateral and left medial lower calf. He also had weeping edema fluid. He was put back into a compression wrap. He was also in the clinic on Friday with intense erythema in his right thigh. Per the patient he was started on Bactrim however that didn't work at all in terms of relieving his pain and swelling. He has taken 3 doxycycline that he had left over from last time and that seems to of helped. He has blistering on the right thigh as well. 07/14/18; the erythema on his right thigh has gotten better with doxycycline that he is finishing. 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 Presumably this cellulitis in the thigh was not related to that although I think this is a bit concerning going forward.  He still has an area on the right lateral calf the blister on the right medial calf just below the knee that was discussed above. On the left 2 small open areas left medial and left lateral. Edema control is adequate. He is using his compression pumps twice a day 07/20/18; continued improvement in the condition of both legs especially the edema in his bilateral thighs. He tells me he is been losing weight through a combination of diet and exercise. He is using his compression pumps twice a day. So overall she made to the remaining wounds 07/27/2018; continued improvement in condition of both legs. His edema is well controlled. The area on the right lateral leg is just about closed he had one blisters show up on the medial left upper calf. We have him in 4 layer compression. He is going on a 10-day trip to [place], [place] and [place]. He  will be driving. He wants to wear Unna boots because of the lessening amount of constriction. He will not use compression pumps while he is away 08/05/18 on evaluation today patient actually appears to be doing decently well all things considered in regard to his bilateral lower extremities. The worst ulcer is actually only posterior aspect of his left lower extremity with a four layer compression wrap cut into his leg a couple weeks back. He did have a trip and actually had [employer] for the trip that he is worn since he was last here. Nonetheless he feels like the [employer] actually do better for him his swelling is up a little bit but he also with his trip was not taking his Lasix on a regular set schedule like he was supposed to be. He states that obviously the reason being that he cannot drive and keep going without having to urinate too frequently which makes it difficult. He did not have his pumps with him while he was away either which I think also maybe playing a role here too. 08/13/2018; the patient only has a small open wound on the  right lateral calf which is a big improvement in the last month or 2. He also has the area posteriorly just below the posterior fossa on the left which I think was a wrap injury from several weeks ago. He has no current evidence of cellulitis. He tells me he is back into his compression pumps twice a day. He also tells me that while he was at the laundromat somebody stole a section of his extremitease stockings 08/20/2018; back in the clinic with a much improved state. He only has small areas on the right lateral mid calf which is just about healed. This was is more substantial area for quite a prolonged period of time. He has a small open area on the left anterior tibia. The area on the posterior calf just below the popliteal fossa is closed today. He is using his compression pumps twice a day 08/28/2018; patient has no open wound on the right leg. He has a smattering of open areas on the calf with some weeping lymphedema. More problematically than that it looks as though his wraps of slipped down in his usual he has very angry upper area of edema just below the right medial knee and on the right lateral calf. He has no open area on his feet. The patient is traveling to [facility] next week. I will send him in an antibiotic. We will continue to wrap the right leg. We ordered extremitease stockings for him last week and I plan to transition the right leg to a stocking when he gets home which will be in 10 days time. As usual he is very reluctant to take his pumps with him when he travels 09/07/2018; patient returns from [facility]. He shows me a picture of his left leg in the mid part of his trip last week with intense fire engine erythema. The picture look bad enough I would have considered sending him to the hospital. Instead he went to the wound care center in [facility]. They did not prescribe him antibiotics but he did take some doxycycline he had leftover from a previous visit.  I had given him trimethoprim sulfamethoxazole before he left this did not work according to the patient. This is resulted in some improvement fortunately. He comes back with a large wound on the left posterior calf. Smaller area  on the left anterior tibia. Denuded blisters on the dorsal left foot over his toes. Does not have much in the way of wounds on the right leg although he does have a very tender area on the right posterior area just below the popliteal fossa also suggestive of infection. He promises me he is back on his pumps twice a day 09/15/2018; the intense cellulitis in his left lower calf is a lot better. The wound area on the posterior left calf is also so better. However he has reasonably extensive wounds on the dorsal aspect of his second and third toes and the proximal foot just at the base of the toes. There is nothing open on the right leg 09/22/2018; the patient has excellent edema control in his legs bilaterally. He is using his external compression pumps twice a day. He has no open area on the right leg and only the areas in the left foot dorsally second and third toe area on the left side. He does not have any signs of active cellulitis. 10/06/2018; the patient has good edema control bilaterally. He has no open wound on the right leg. There is a blister in the posterior aspect of his left calf that we had to deal with today. He is using his compression pumps twice a day. There is no signs of active cellulitis. We have been using silver alginate to the wound areas. He still has vulnerable areas on the base of his left first second toes dorsally He has a his extremities stockings and we are going to transition him today into the stocking on the right leg. He is cautioned that he will need to continue to use the compression pumps twice a day. If he notices uncontrolled edema in the right leg he may need to go to 3 times a day. 10/13/2018; the patient came in for a nurse check  on Friday he has a large flaccid blister on the right medial calf just below the knee. We unroofed this. He has this and a new area underneath the posterior mid calf which was undoubtedly a blister as well. He also has several small areas on the right which is the area we put his extremities stocking on. 10/19/2018; the patient went to see infectious disease this morning I am not sure if that was a routine follow-up in any case the doxycycline I had given him was discontinued and started on linezolid. He has not started this. It is easy to look at his left calf and the inflammation and think this is cellulitis however he is very tender in the tissue just below the popliteal fossa and I have no doubt that there is infection going on here. He states the problem he is having is that with the compression pumps the edema goes down and then starts walking the wrap falls down. We will see if we can adhere this. He has 1 or 2 minuscule open areas on the right still areas that are weeping on the posterior left calf, the base of his left second and third toes 10/26/18; back today in clinic with quite of skin breakdown in his left anterior leg. This may have been infection the area below the popliteal fossa seems a lot better however tremendous epithelial loss on the left anterior mid tibia area over quite inexpensive tissue. He has 2 blisters on the right side but no other open wound here. 10/29/2018; came in urgently to see [place] today and we worked him in for review.  He states that the 4 layer compression on the right leg caused pain he had to cut it down to roughly his mid calf this caused swelling above the wrap and he has blisters and skin breakdown today. As a result of the pain he has not been using his pumps. Both legs are a lot more edematous and there is a lot of weeping fluid. 11/02/18; arrives in clinic with continued difficulties in the right leg> left. Leg is swollen and painful. multiple  skin blisters and new open areas especially laterally. He has not been using his pumps on the right leg. 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 He is not systemically unwell. 11/09/2018; the patient claims he is being compliant with his pumps. He is finished the doxycycline I gave him last week. Culture I did of the wound on the right lateral leg showed a few very resistant methicillin staph aureus. This was resistant to doxycycline. Nevertheless he states the pain in the leg is a lot better which makes me wonder if the cultured organism was not really what was causing the problem nevertheless this is a very dangerous organism to be culturing out of any wound. His right leg is still a lot larger than the left. He is using an Radio broadcast assistant on this area, he blames a 4-layer compression for causing the original skin breakdown which I doubt is true however I cannot talk him out of it. We have been using silver alginate to all of these areas which were initially blisters 11/16/2018; patient is being compliant with his external compression pumps at twice a day. Miraculously he arrives in clinic today with absolutely no open wounds. He has better edema control on the left where he has been using 4 layer compression versus wound of wounds on the right and I pointed this out to him. There is no inflammation in the skin in his lower legs which is also somewhat unusual for him. There is no open wounds on the dorsal left foot. He has extremitease stockings at home and I have asked him to bring these in next week. 11/25/18 patient's lower extremity on examination today on the left appears for the most part to be wound free. He does have an open wound on the lateral aspect of the right lower extremity but this is minimal compared to what I've seen in past. He does request that we go ahead and wrap the left leg as well even though there's nothing open just so hopefully  it will not reopen in short order. 1/28; patient has superficial open wounds on the right lateral calf left anterior calf and left posterior calf. His edema control is adequate. 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 We have been using silver alginate as the primary dressing. He claims compliance with his compression pumps 2/4; patient has superficial open wounds on numerous areas of his left calf and again one on the left dorsal foot. The areas on the right lateral calf have healed. The cellulitis that I gave him doxycycline for last week is also resolved this was mostly on the left anterior calf just below the tibial tuberosity. His edema looks fairly well-controlled. He tells me he went to see his primary doctor today and had blood work ordered 2/11; once again he has several open areas on the left calf left tibial area. Most of  these are small and appear to have healthy granulation. He does not have anything open on the right. The edema and control in his thighs is pretty good which is usually a good indication he has been using his pumps as requested. 2/18; he continues to have several small areas on the left calf and left tibial area. Most of these are small healthy granulation. We put him in his stocking on the right leg last week and he arrives with a superficial open area over the right upper tibia and a fairly large area on the right lateral tibia in similar condition. His edema control actually does not look too bad, he claims to be using his compression pumps twice a day 2/25. Continued small areas on the left calf and left tibial area. New areas especially on the right are identified just below the tibial tuberosity and on the right upper tibia itself. There are also areas of weeping edema fluid even without an obvious wound. He does not have a considerable degree of lymphedema but clearly there  is more edema here than his skin can handle. He states he is using the pumps twice a day. We have an Unna boot on the right and 4 layer compression on the left. 3/3; he continues to have an area on the right lateral calf and right posterior calf just below the popliteal fossa. There is a fair amount of tenderness around the wound on the popliteal fossa but I did not see any evidence of cellulitis, could just be that the wrap came down and rubbed in this area. ooHe does not have an open area on the left leg however there is an area on the left dorsal foot at the base of the third toe ooWe have been using silver alginate to all wound areas 3/10; he did not have an open area on his left leg last time he was here a week ago. Today he arrives with a horizontal wound just below the tibial tuberosity and an area on the left lateral calf. He has intense erythema and tenderness in this area. The area is on the right lateral calf and right posterior calf better than last week. We have been using silver alginate as usual 3/18 - Patient returns with 3 small open areas on left calf, and 1 small open area on right calf, the skin looks ok with no significant erythema, he continues the UNA boot on right and 4 layer compression on left. The right lateral calf wound is closed , the right posterior is small area. we will continue silver alginate to the areas. 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 He is been tolerating the dressing changes without complication. Fortunately she has made excellent improvement in regard to the overall status of his wounds. Unfortunately every time we cease wrapping him he ends up reopening in causing more significant issues at that point. Again I'm unsure of the best direction  to take although I think the lymphedema clinic may be appropriate for him. 02/03/19 on evaluation today patient appears to be doing well in regard to the wounds that we saw him for last week unfortunately he has a new area on the proximal portion of his right medial/posterior lower extremity where the wrap somewhat slowed down and caused swelling and a blister to  rub and open. Unfortunately this is the only opening that he has on either leg at this point. 02/17/19 on evaluation today patient's bilateral lower extremities appear to be doing well. He still completely healed in regard to the left lower extremity. In regard to the right lower extremity the area where the wrap and slid down and caused the blister still seems to be slightly open although this is dramatically better than during the last evaluation two weeks ago. I'm very pleased with the way this stands overall. 03/03/19 on evaluation today patient appears to be doing well in regard to his right lower extremity in general although he did have a new blister open this does not appear to be showing any evidence of active infection at this time. Fortunately there's No fevers, chills, nausea, or vomiting noted at this time. Overall I feel like he is making good progress it does feel like that the right leg will we perform the [employer] seems to do with a bit better than three layer wrap on the left which slid down on him. We may switch to doing bilateral in the book wraps. 5/4; I have not seen Mr. Larkin in quite some time. According to our case manager he did not have an open wound on his left leg last week. He had 1 remaining wound on the right posterior medial calf. He arrives today with multiple openings on the left leg probably were blisters and/or wrap injuries from Unna boots. I do not think the Unna boot's will provide adequate compression on the left. I am also not clear about the frequency he is using the compression pumps. 03/17/19 on  evaluation today patient appears to be doing excellent in regard to his lower extremities compared to last week's evaluation apparently. He had gotten significantly worse last week which is unfortunate. The [employer] wrap on the left did not seem to do very well for him at all and in fact it didn't control his swelling significantly enough he had an additional outbreak. Subsequently we go back to the four layer compression wrap on the left. This is good news. At least in that he is doing better and the wound seem to be killing him. He still has not heard anything from the lymphedema clinic. 03/24/19 on evaluation today patient actually appears to be doing much better in regard to his bilateral lower Trinity as compared to last week when I saw him. Fortunately there's no signs of active infection at this time. He has been tolerating the dressing changes without complication. Overall I'm extremely pleased with the progress and appearance in general. 04/07/19 on evaluation today patient appears to be doing well in regard to his bilateral lower extremities. His swelling is significantly down from where it was previous. With that being said he does have a couple blisters still open at this point but fortunately nothing that seems to be too severe and again the majority of the larger openings has healed at this time. 04/14/19 on evaluation today patient actually appears to be doing quite well in regard to his bilateral lower extremities in fact I'm not even sure there's anything significantly open at this time at any site. Nonetheless he did have some trouble with these wraps where they are somewhat irritating him secondary to the fact that he has noted that the graph wasn't too close down to the end of this foot in a little bit short as well up to his knee. Otherwise things seem to be  doing quite well. 04/21/19 upon evaluation today patient's wound bed actually showed evidence of being completely healed in  regard to both lower extremities which is excellent news. There does not appear to be any signs of active infection which is also good news. I'm very pleased in this regard. No fevers, chills, nausea, or vomiting noted at this time. 04/28/19 on evaluation today patient appears to be doing a little bit worse in regard to both lower extremities on the left mainly due to the fact that when he went infection disease the wrap was not wrapped quite high enough he developed a blister above this. On the right he is a small open area of nothing too significant but again this is continuing to give him some trouble he has been were in the Velcro compression that he has at home. 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 He's been tolerating the dressing changes without complication. Fortunately there's no signs of active infection at this time. No fevers, chills, nausea, or vomiting noted at this time. We have been trying to get an appointment with her lymphedema clinic in [facility] but unfortunately nobody can get them on phone with not been able to even fax information over the patient likewise is not been able to get in touch with them. Overall I'm not sure exactly what's going on here with to reach out again today. 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 Still having a lot of drainage unfortunately. He tells me especially in the left but even on the right. There's no signs of active infection which is good news we've been using so ratcheted up to this point. 05/19/19 on evaluation today patient actually appears to be doing quite well with regard to his left lower extremity which is great news. Fortunately in regard to the right lower extremity has an issues with his wrap and he subsequently did remove this from what I'm understanding.  Nonetheless long story short is what he had rewrapped once he removed it subsequently had maggots underneath this wrap whenever he came in for evaluation today. With that being said they were obviously completely cleaned away by the nursing staff. The visit today which is excellent news. However he does appear to potentially have some infection around the right ankle region where the maggots were located as well. He will likely require anabiotic therapy today. 05/26/19 on evaluation today patient actually appears to be doing much better in regard to his bilateral lower extremities. I feel like the infection is under much better control. With that being said there were maggots noted when the wrap was removed yet again today. Again this could have potentially been left over from previous although at this time there does not appear to be any signs of significant drainage there was obviously on the wrap some drainage as well this contracted gnats or otherwise. Either way I do not see anything that appears to be doing worse in my pinion and in fact I think his drainage has slowed down quite significantly likely mainly due to the fact to his infection being under better control. 06/02/2019 on evaluation today patient actually appears to be doing well with regard to his bilateral lower extremities there is no signs of active infection at this time which is great news. With that being said he does have several open areas more  so on the right than the left but nonetheless these are all significantly better than previously noted. 06/09/2019 on evaluation today patient actually appears to be doing well. His wrap stayed up and he did not cause any problems he had more drainage on the right compared to the left but overall I do not see any major issues at this time which is great news. 06/16/2019 on evaluation today patient appears to be doing excellent with regard to his lower extremities the only area that is  open is a new blister that can have opened as of today on the medial ankle on the left. Other than this he really seems to be doing great I see no major issues at this point. 06/23/2019 on evaluation today patient appears to be doing quite well with regard to his bilateral lower extremities. In fact he actually appears to be almost completely healed there is a small area of weeping noted of the right lower extremity just above the ankle. Nonetheless fortunately there is no signs of active infection at this time which is good news. No fevers, chills, nausea, vomiting, or diarrhea. 8/24; the patient arrived for a nurse visit today but complained of very significant pain in the left leg and therefore I was asked to look at this. Noted that he did not have an open area on the left leg last week nevertheless this was wrapped. The patient states that he is not been able to put his compression pumps on the left leg because of the discomfort. He has not been systemically unwell 06/30/2019 on evaluation today patient unfortunately despite being excellent last week is doing much worse with regard to his left lower extremity today. In fact he had to come in for a nurse on Monday where his left leg had to be rewrapped due to excessive weeping Dr. Leanord Hawking placed him on doxycycline at that point. Fortunately there is no signs of active infection Systemically at this time which is good news. 07/07/2019 in regard to the patient's wounds today he actually seems to be doing well with his right lower extremity there really is nothing open or draining at this point this is great news. Unfortunately the left lower extremity is given him additional trouble at this time. There does not appear to be any signs of active infection nonetheless he does have a lot of edema and swelling noted at this point as well as blistering all of which has led to a much more poor appearing leg at this time compared to where it was 2 weeks ago  when it was almost completely healed. Obviously this is a little discouraging for the patient. He is try to contact the lymphedema clinic in [place] he has not been able to get through to them. 07/14/2019 on evaluation today patient actually appears to be doing slightly better with regard to his left lower extremity ulcers. Overall I do feel like at least at the top of the wrap that we have been placing this area has healed quite nicely and looks much better. The remainder of the leg is showing signs of improvement. Unfortunately in the thigh area he still has an open region on the left and again on the right he has been utilizing just a Band-Aid on an area that also opened on the thigh. Again this is an area that were not able to wrap although we did do an Ace wrap to provide some compression that something that obviously is a little less effective than  the compression wraps we have been using on the lower portion of the leg. He does have an appointment with the lymphedema clinic in [facility] on Friday. 07/21/2019 on evaluation today patient appears to be doing better with regard to his lower extremity ulcers. He has been tolerating the dressing changes without complication. Fortunately there is no signs of active infection at this time. No fevers, chills, nausea, vomiting, or diarrhea. I did receive the paperwork from the physical therapist at the lymphedema clinic in [place]. Subsequently I signed off on that this morning and sent that back to him for further progression with the treatment plan. 07/28/2019 on evaluation today patient appears to be doing very well with regard to his right lower extremity where I do not see any open wounds at this point. Fortunately he is feeling great as far as that is concerned as well. In regard to the left lower extremity he has been having issues with still several areas of weeping and edema although the upper leg is doing better his lower leg  still I think is going require the compression wrap at this time. No fevers, chills, nausea, vomiting, or diarrhea. 08/04/2019 on evaluation today patient unfortunately is having new wounds on the right lower extremity. Again we have been using Unna boot wrap on that side. We switched him to using his juxta lite wrap at home. With that being said he tells me he has been using it although his legs extremely swollen and to be honest really does not appear that he has been. I cannot know that for sure however. Nonetheless he has multiple new wounds on the right lower extremity at this time. Obviously we will have to see about getting this rewrapped for him today. 08/11/2019 on evaluation today patient appears to be doing fairly well with regard to his wounds. He has been tolerating the dressing changes including the compression wraps without complication. He still has a lot of edema in his upper thigh regions bilaterally he is supposed to be seeing the lymphedema clinic on the 15th of this month once his wraps arrive for the upper part of his legs. 08/18/2019 on evaluation today patient appears to be doing well with regard to his bilateral lower extremities at this point. He has been tolerating the dressing changes without complication. Fortunately there is no signs of active infection which is also good news. He does have a couple weeping areas on the first and second toe of the right foot he also has just a small area on the left foot upper leg and a small area on the left lower leg but overall he is doing quite well in my opinion. He is supposed to be getting his wraps shortly in fact tomorrow and then subsequently is seeing the lymphedema clinic next Wednesday on the 21st. Of note he is also leaving on the 25th to go on vacation for a week to the beach. For that reason and since there is some uncertainty about what there can be doing at lymphedema clinic next Wednesday I am get a make an appointment  for next Friday here for [place] to see what we need to do for him prior to him leaving for vacation. 10/23; patient arrives in considerable pain predominantly in the upper posterior calf just distal to the popliteal fossa also in the wound anteriorly above the major wound. This is probably cellulitis and he has had this recurrently in the past. He has no open wound on the right side  and he has had an Radio broadcast assistant in that area. Finally I note that he has an area on the left posterior calf which by enlarge is mostly epithelialized. This protrudes beyond the borders of the surrounding skin in the setting of dry scaly skin and lymphedema. The patient is leaving for [facility] on Sunday. Per his longstanding pattern, he will not take his compression pumps with him predominantly out of fear that they will be stolen. He therefore asked that we put a Unna boot back on the right leg. He will also contact the wound care center in [facility] to see if they can change his dressing in the mid week. 11/3; patient returned from his vacation to [facility]. He was seen on 1 occasion at their wound care center. They did a 2 layer compression system as they did not have our 4-layer wrap. I am not completely certain what they put on the wounds. They did not change the Unna boot on the right. The patient is also seeing a lymphedema specialist physical therapist in [place]. It appears that he has some compression sleeve for his thighs which indeed look quite a bit better than I am used to seeing. He pumps over these with his external compression pumps. 11/10; the patient has a new wound on the right medial thigh otherwise there is no open areas on the right. He has an area on the left leg posteriorly anteriorly and medially and an area over the left second toe. We have been using silver alginate. He thinks the injury on his thigh is secondary to friction from the compression sleeve he has. 11/17; the patient has  a new wound on the right medial thigh last week. He thinks this is because he did not have a underlying stocking for his thigh juxta lite apparatus. He now has this. The area is fairly large and somewhat angry but I do not think he has underlying cellulitis. ooHe has a intact blister on the right anterior tibial area. ooSmall wound on the right great toe dorsally ooSmall area on the medial left calf. 11/30; the patient does not have any open areas on his right leg and we did not take his juxta lite stocking off. However he states that on Friday his compression wrap fell down lodging around his upper mid calf area. As usual this creates a lot of problems for him. He called urgently today to be seen for a nurse visit however the nurse visit turned into a provider visit because of extreme erythema and pain in the left anterior tibia extending laterally and posteriorly. The area that is problematic is extensive 10/06/2019 upon evaluation today patient actually appears to be doing poorly in regard to his left lower extremity. He Dr. Leanord Hawking did place him on doxycycline this past Monday apparently due to the fact that he was doing much worse in regard to this left leg. Fortunately the doxycycline does seem to be helping. Unfortunately we are still having a very difficult time getting his edema under any type of control in order to anticipate discharge at some point. The only way were really able to control his lymphedema really is with compression wraps and that has only even seemingly temporary. He has been seeing a lymphedema clinic they are trying to help in this regard but still this has been somewhat frustrating in general for the patient. 10/13/19 on evaluation today patient appears to be doing excellent with regard to his right lower extremity as far  as the wounds are concerned. His swelling is still quite extensive unfortunately. He is still having a lot of drainage from the thigh areas  bilaterally which is unfortunate. He's been going to lymphedema clinic but again he still really does not have this edema under control as far as his lower extremities are concern. With regard to his left lower extremity this seems to be improving and I do believe the doxycycline has been of benefit for him. He is about to complete the doxycycline. 10/20/2019 on evaluation today patient appears to be doing poorly in regard to his bilateral lower extremities. More in the right thigh he has a lot of irritation at this site unfortunately. In regard to the left lower extremity the wrap was not quite as high it appears and does seem to have caused him some trouble as well. Fortunately there is no evidence of systemic infection though he does have some blue-green drainage which has me concerned for the possibility of Pseudomonas. He tells me he is previously taking Cipro without complications and he really does not care for Levaquin however due to some of the side effects he has. He is not allergic to any medications specifically antibiotics that were aware of. 10/27/2019 on evaluation today patient actually does appear to be for the most part doing better when compared to last week's evaluation. With that being said he still has multiple open wounds over the bilateral lower extremities. He actually forgot to start taking the Cipro and states that he still has the whole bottle. He does have several new blisters on left lower extremity today I think I would recommend he go ahead and take the Cipro based on what I am seeing at this point. 12/30-Patient comes at 1 week visit, 4 layer compression wraps on the left and Unna boot on the right, primary dressing Xtrasorb and silver alginate. Patient is taking his Cipro and has a few more days left probably 5-6, and the legs are doing better. He states he is using his compressions devices which I believe he has 11/10/2019 on evaluation today patient actually  appears to be much better than last time I saw him 2 weeks ago. His wounds are significantly improved and overall I am very pleased in this regard. Fortunately there is no signs of active infection at this time. He is just a couple of days away from completing Cipro. Overall his edema is much better he has been using his lymphedema pumps which I think is also helping at this point. 11/17/2019 on evaluation today patient appears to be doing excellent in regard to his wounds in general. His legs are swollen but not nearly as much as they have been in the past. Fortunately he is tolerating the compression wraps without complication. No fevers, chills, nausea, vomiting, or diarrhea. He does have some erythema however in the distal portion of his right lower extremity specifically around the forefoot and toes there is a little bit of warmth here as well. 11/24/2019 on evaluation today patient appears to be doing well with regard to his right lower extremity I really do not see any open wounds at this point. His left lower extremity does have several open areas and his right medial thigh also is open. Other than this however overall the patient seems to be making good progress and I am very pleased at this point. 12/01/2019 on evaluation today patient appears to be doing poorly at this point in regard to his left lower extremity has  several new blisters despite the fact that we have him in compression wraps. In fact he had a 4-layer compression wrap, his upper thigh wrapped from lymphedema clinic, and a juxta light over top of the 4 layer compression wrap the lymphedema clinic applied and despite all this he still develop blisters underneath. Obviously this does have me concerned about the fact that unfortunately despite what we are doing to try to get wounds healed he continues to have new areas arise I do not think he is ever good to be at the point where he can realistically just use wraps at home to  keep things under control. Typically when we heal him it takes about 1-2 days before he is back in the clinic with severe breakdown and blistering of his lower extremities bilaterally. This is happened numerous times in the past. Unfortunately I think that we may need some help as far as overall fluid overload to kind of limit what we are seeing and get things under better control. 12/08/2019 on evaluation today patient presents for follow-up concerning his ongoing bilateral lower extremity edema. Unfortunately he is still having quite a bit of swelling the compression wraps are controlling this to some degree but he did see Dr. [name] his cardiologist I do have that available for review today as far as the appointment was concerned that was on 12/06/2019. Obviously that she has been 2 days ago. The patient states that he is only been taking the Lasix 80 mg 1 time a day he had told me previously he was taking this twice a day. Nonetheless Dr. [name] recommended this be up to 80 mg 2 times a day for the patient as he did appear to be fluid overloaded. With that being said the patient states he did this yesterday and he was unable to go anywhere or do anything due to the fact that he was constantly having to urinate. Nonetheless I think that this is still good to be something that is important for him as far as trying to get his edema under control at all things that he is going to be able to just expect his wounds to get under control and things to be better without going through at least a period of time where he is trying to stabilize his fluid management in general and I think increasing the Lasix is likely the first step here. It was also mentioned the possibility that the patient may require metolazone. With that being said he wanted to have the patient take Lasix twice a day first and then reevaluating 2 months to see where things stand. 12/15/2019 upon evaluation today patient appears to be doing  regard to his legs although his toes are showing some signs of weeping especially on the left at this point to some degree on the right. There does not appear to be any signs of active infection and overall I do feel like the compression wraps are doing well for him but he has not been able to take the Lasix at home and the increased dose that Dr. [name] recommended. He tells me that just not go to be feasible for him. Nonetheless I think in this case he should probably send a message to Dr. [name] in order to discuss options from the standpoint of possible admission to get the fluid off or otherwise going forward. 12/22/2019 upon evaluation today patient appears to be doing fairly well with regard to his lower extremities at this point. In fact he  would be doing excellent if it was not for the fact that his right anterior thigh apparently had an allergic reaction to adhesive tape that he used. The wound itself that we have been monitoring actually appears to be healed. There is a lot of irritation at this point. 12/29/2019 upon evaluation today patient appears to be doing well in regard to his lower extremities. His left medial thigh is open and somewhat draining today but this is the only region that is open the right has done much better with the treatment utilizing the steroid cream that I prescribed for him last week. Overall I am pleased in that regard. Fortunately there is no signs of active infection at this time. No fevers, chills, nausea, vomiting, or diarrhea. 01/05/2020 upon evaluation today patient appears to be doing more poorly in regard to his right lower extremity at this point upon evaluation today. Unfortunately he continues to have issues in this regard and I think the biggest issue is controlling his edema. This obviously is not very well controlled at this point is been recommended that he use the Lasix twice a day but he has not been able to do that. Unfortunately I think this is  leading to an issue where honestly he is not really able to effectively control his edema and therefore the wounds really are not doing significantly better. I do not think that he is going to be able to keep things under good control unless he is able to control his edema much better. I discussed this again in great detail with him today. 01/12/2020 good news is patient actually appears to be doing quite well today at this point. He does have an appointment with lymphedema clinic tomorrow. His legs appear healed and the toe on the left is almost completely healed. In general I am very pleased with how things stand at this point. 01/19/2020 upon evaluation today patient appears to actually be doing well in regard to his lower extremities there is nothing open at this point. Fortunately he has done extremely well more recently. Has been seeing lymphedema clinic as well. With that being said he has Velcro wraps for his lower legs as well as his upper legs. The only wound really is on his toe which is the right great toe and this is barely anything even there. With all that being said I think it is good to be appropriate today to go ahead and switch him over to the Velcro compression wraps. 01/26/2020 upon evaluation today patient appears to be doing worse with regard to his lower extremities after last week switch him to Velcro compression wraps. Unfortunately he lasted less than 24 hours he did not have the sock portion of his Velcro wrap on the left leg and subsequently developed a blister underneath the Velcro portion. Obviously this is not good and not what we were looking for at this point. He states the lymphedema clinic did tell him to wear the wrap for 23 hours and take him off for 1 I am okay with that plan but again right now we got a get things back under control again he may have some cellulitis noted as well. 02/02/2020 upon evaluation today patient unfortunately appears to have several  areas of blistering on his bilateral lower extremities today mainly on the feet. His legs do seem to be doing somewhat better which is good news. Fortunately there is no evidence of active infection at this time. No fevers, chills, nausea, vomiting, or  diarrhea. 02/16/2020 upon evaluation today patient appears to be doing well at this time with regard to his legs. He has a couple weeping areas on his toes but for the most part everything is doing better and does appear to be sealed up on his legs which is excellent news. We can continue with wrapping him at this point as he had every time we discontinue the wraps he just breaks out with new wounds. There is really no point in is going forward with this at this point. Objective Constitutional Well-nourished and well-hydrated in no acute distress. Vitals Time Taken: 10:51 AM, Height: 70 in, Weight: 380.2 lbs, BMI: 54.5, Temperature: 97.5 F, Pulse: 62 bpm, Respiratory Rate: 18 breaths/min, Blood Pressure: 146/63 mmHg. Respiratory normal breathing without difficulty. Psychiatric this patient is able to make decisions and demonstrates good insight into disease process. Alert and Oriented x 3. pleasant and cooperative. General Notes: Upon inspection patient's legs show no signs of breakdown at this point time. There is no evidence of active infection and overall the patient seems to be doing quite well which is good news. Integumentary (Hair, Skin) Wound #161 status is Healed - Epithelialized. Original cause of wound was Gradually Appeared. The wound is located on the Right Toe Great. The wound measures 0cm length x 0cm width x 0cm depth; 0cm^2 area and 0cm^3 volume. There is no tunneling or undermining noted. There is a none present amount of drainage noted. The wound margin is distinct with the outline attached to the wound base. There is no granulation within the wound bed. There is no necrotic tissue within the wound bed. Wound #162 status  is Open. Original cause of wound was Gradually Appeared. The wound is located on the Right Toe Second. The wound measures 0.4cm length x 1.3cm width x 0.1cm depth; 0.408cm^2 area and 0.041cm^3 volume. There is no tunneling or undermining noted. There is a small amount of serous drainage noted. The wound margin is distinct with the outline attached to the wound base. There is large (67-100%) pink granulation within the wound bed. There is no necrotic tissue within the wound bed. Wound #163 status is Healed - Epithelialized. Original cause of wound was Blister. The wound is located on the Left,Medial Lower Leg. The wound measures 0cm length x 0cm width x 0cm depth; 0cm^2 area and 0cm^3 volume. There is no tunneling or undermining noted. There is a none present amount of drainage noted. There is no granulation within the wound bed. There is no necrotic tissue within the wound bed. Wound #164 status is Healed - Epithelialized. Original cause of wound was Blister. The wound is located on the Right,Medial Lower Leg. The wound measures 0cm length x 0cm width x 0cm depth; 0cm^2 area and 0cm^3 volume. There is no tunneling or undermining noted. There is a none present amount of drainage noted. The wound margin is flat and intact. There is no granulation within the wound bed. There is no necrotic tissue within the wound bed. Wound #165 status is Open. Original cause of wound was Gradually Appeared. The wound is located on the Left Toe Great. The wound measures 0.9cm length x 1.1cm width x 0.1cm depth; 0.778cm^2 area and 0.078cm^3 volume. There is no tunneling or undermining noted. There is a medium amount of serous drainage noted. The wound margin is flat and intact. There is large (67-100%) pink granulation within the wound bed. There is no necrotic tissue within the wound bed. Wound #166 status is Healed - Epithelialized. Original  cause of wound was Gradually Appeared. The wound is located on the  Left,Dorsal Foot. The wound measures 0cm length x 0cm width x 0cm depth; 0cm^2 area and 0cm^3 volume. There is no tunneling or undermining noted. There is a none present amount of drainage noted. The wound margin is flat and intact. There is no granulation within the wound bed. There is no necrotic tissue within the wound bed. 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 Post procedure Diagnosis Wound #: Same as Pre-Procedure There was a Four Layer Compression Therapy Procedure by Shawn Stall, RN. Post procedure Diagnosis Wound #: Same as Pre-Procedure Plan Follow-up Appointments: Return appointment in 3 weeks. - with Leonard Schwartz Nurse Visit: - 1 week Dressing Change Frequency: Do not change entire dressing for one week. - both legs Skin Barriers/Peri-Wound Care: Moisturizing lotion - to legs Wound Cleansing: Wound #162 Right Toe Second: May shower and wash wound with soap and water. Primary Wound Dressing: Wound #162 Right Toe Second: Calcium Alginate with Silver Wound #165 Left Toe Great: Calcium Alginate with Silver Secondary Dressing: Wound #162 Right Toe Second: Kerlix/Rolled Gauze Dry Gauze Edema Control: 4 layer compression: Left lower extremity Unna Boot to Right Lower Extremity Avoid standing for long periods of time Elevate legs to the level of the heart or above for 30 minutes daily and/or when sitting, a frequency of: - whenever sitting Exercise regularly Segmental Compressive Device.  - lymphadema pumps 60 minutes 2 times per day Other: - If feet or wraps become wet, remove wrap and apply juxtalites Additional Orders / Instructions: Other: - take lasix as prescribed by cardiologist 1. My suggestion at this time is can be that we go ahead and continue with the wraps on his bilateral lower extremities per directions above. 2. We will also continue at any open wound areas on his toes to use the silver alginate dressings which also seem to be helpful for him. 3. I am also can recommend the patient continue take the Lasix as prescribed by cardiology. 4. Also recommend that he wear his juxta light wraps and use his lymphedema pumps 2 times a day for 60 minutes per time. We will see patient back for reevaluation in 1 week here in the clinic. If anything worsens or changes patient will contact our office for additional recommendations. This week for a nurse visit wrap change next week and then I will see him in 2 weeks. Electronic Signature(s) Signed: 02/16/2020 1:46:38 PM By: Lenda Kelp PA-C Entered By: Lenda Kelp on 02/16/2020 13:46:38 -------------------------------------------------------------------------------- SuperBill Details Patient Name: Date of Service: [name] 02/16/2020 Medical Record [id number] Patient Account Number: [id number] Date of Birth/Sex: Treating RN: 1951/06/02 (69 y.o. [name] Primary Care Provider: Nicoletta Ba Other Clinician: Referring Provider: Treating Provider/Extender:[name], [name], [name] in Treatment: 212 Diagnosis Coding ICD-10 Codes Code Description 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 skin L97.221 Non-pressure chronic ulcer of left calf limited to breakdown of skin 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 of bilateral lower extremity I89.0 Lymphedema, not elsewhere classified  E11.622 Type 2 diabetes mellitus  with other skin ulcer 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, unspecified L03.116 Cellulitis of left lower limb Facility Procedures CPT4 Code Description: [id number] (Facility Use Only) [phone] - APPLY Roland Rack BOOT RT Modifier: Quantity: 1 CPT4 Code Description: [id number] (Facility Use Only) [phone] - APPLY MULTLAY COMPRS LWR LT LEG Modifier: 59 Quantity: 1 Physician Procedures CPT4: Description Modifier Quantity Code [id number] - WC PHYS LEVEL 3 - EST PT 1 ICD-10 Diagnosis Description 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 skin L97.221 Non-pressure chronic ulcer of left calf limited to  breakdown of skin 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 of bilateral lower extremity I89.0 Lymphedema, not elsewhere classified Electronic Signature(s) Signed: 02/16/2020 1:47:09 PM By: Lenda Kelp PA-C Entered By: Lenda Kelp on 02/16/2020 13:46:53

## 2020-02-21 DIAGNOSIS — E1142 Type 2 diabetes mellitus with diabetic polyneuropathy: Secondary | ICD-10-CM | POA: Diagnosis not present

## 2020-02-21 DIAGNOSIS — Z794 Long term (current) use of insulin: Secondary | ICD-10-CM | POA: Diagnosis not present

## 2020-02-21 DIAGNOSIS — B351 Tinea unguium: Secondary | ICD-10-CM | POA: Diagnosis not present

## 2020-02-21 NOTE — Progress Notes (Signed)
Pt with chronic LE cellulitis, pen G IM q month recommended by pt's ID specialist. I agree with bicillin injection today. Signed:  Santiago Bumpers, MD           02/21/2020

## 2020-02-22 ENCOUNTER — Other Ambulatory Visit (INDEPENDENT_AMBULATORY_CARE_PROVIDER_SITE_OTHER): Payer: Medicare Other

## 2020-02-22 DIAGNOSIS — I10 Essential (primary) hypertension: Secondary | ICD-10-CM

## 2020-02-23 ENCOUNTER — Other Ambulatory Visit: Payer: Self-pay

## 2020-02-23 ENCOUNTER — Encounter (HOSPITAL_BASED_OUTPATIENT_CLINIC_OR_DEPARTMENT_OTHER): Payer: Medicare Other | Admitting: Physician Assistant

## 2020-02-23 DIAGNOSIS — E11622 Type 2 diabetes mellitus with other skin ulcer: Secondary | ICD-10-CM | POA: Diagnosis not present

## 2020-02-23 DIAGNOSIS — I482 Chronic atrial fibrillation, unspecified: Secondary | ICD-10-CM | POA: Diagnosis not present

## 2020-02-23 DIAGNOSIS — L97511 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 of skin: Secondary | ICD-10-CM | POA: Diagnosis not present

## 2020-02-23 DIAGNOSIS — I890 Lymphedema, not elsewhere classified: Secondary | ICD-10-CM | POA: Diagnosis not present

## 2020-02-23 DIAGNOSIS — I872 Venous insufficiency (chronic) (peripheral): Secondary | ICD-10-CM | POA: Diagnosis not present

## 2020-02-23 DIAGNOSIS — E1140 Type 2 diabetes mellitus with diabetic neuropathy, unspecified: Secondary | ICD-10-CM | POA: Diagnosis not present

## 2020-02-23 DIAGNOSIS — I10 Essential (primary) hypertension: Secondary | ICD-10-CM | POA: Diagnosis not present

## 2020-02-23 DIAGNOSIS — M109 Gout, unspecified: Secondary | ICD-10-CM | POA: Diagnosis not present

## 2020-02-23 DIAGNOSIS — I272 Pulmonary hypertension, unspecified: Secondary | ICD-10-CM | POA: Diagnosis not present

## 2020-02-23 DIAGNOSIS — L03116 Cellulitis of left lower limb: Secondary | ICD-10-CM | POA: Diagnosis not present

## 2020-02-23 DIAGNOSIS — L97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 right calf limited to breakdown of skin: Secondary | ICD-10-CM | POA: Diagnosis not present

## 2020-02-23 DIAGNOSIS — L97521 Non-pressure chronic ulcer of other 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 of skin: Secondary | ICD-10-CM | POA: Diagnosis not present

## 2020-02-23 DIAGNOSIS — Z9119 Patient's noncompliance with other medical treatment and regimen: Secondary | ICD-10-CM | POA: Diagnosis not present

## 2020-02-23 DIAGNOSIS — E11621 Type 2 diabetes mellitus with foot ulcer: Secondary | ICD-10-CM | POA: Diagnosis not present

## 2020-02-23 DIAGNOSIS — I87333 Chronic venous hypertension (idiopathic) with ulcer and inflammation of bilateral lower extremity: Secondary | ICD-10-CM | POA: Diagnosis not present

## 2020-02-23 DIAGNOSIS — Z794 Long term (current) use of insulin: Secondary | ICD-10-CM | POA: Diagnosis not present

## 2020-02-23 DIAGNOSIS — L97221 Non-pressure chronic ulcer of left calf limited to breakdown of skin: Secondary | ICD-10-CM | POA: Diagnosis not present

## 2020-02-23 NOTE — Progress Notes (Signed)
CLIFF, DAMIANI ([id number]) Visit Report for 02/23/2020 Arrival Information Details Patient Name: Date of Service: KHAIR, CHASTEEN 02/23/2020 10:30 AM Medical Record [id number] Patient Account Number: [id number] Date of Birth/Sex: Treating RN: 1951/07/08 (69 y.o. [name] Primary Care Delenn Ahn: Nicoletta Ba Other Clinician: Referring Ringo Sherod: Treating Katharin Schneider/Extender:[name], [name], [name] in Treatment: 213 Visit Information History Since Last Visit Walker All ordered tests and consults were completed: No Patient Arrived: Added or deleted any medications: No Arrival Time: 10:27 Any new allergies or adverse reactions: No Accompanied By: self Had a fall or experienced change in No Transfer Assistance: None activities of daily living that may affect Patient Identification Verified: Yes risk of falls: Secondary Verification Process Completed: Yes Signs or symptoms of abuse/neglect since last No Patient Requires Transmission-Based No visito Precautions: Hospitalized since last visit: No Patient Has Alerts: Yes Implantable device outside of the clinic excluding No cellular tissue based products placed in the center since last visit: Has Dressing in Place as Prescribed: Yes Has Compression in Place as Prescribed: Yes Pain Present Now: No Electronic Signature(s) Signed: 02/23/2020 10:53:23 AM By: Shawn Stall Entered By: Shawn Stall on 02/23/2020 10:28:08 -------------------------------------------------------------------------------- Compression Therapy Details Patient Name: Date of Service: [name] 02/23/2020 10:30 AM Medical Record [id number] Patient Account Number: [id number] Date of Birth/Sex: Treating RN: 04/16/1951 (69 y.o. [name] Primary Care Ted Goodner: Nicoletta Ba Other Clinician: Referring Myha Arizpe: Treating Diavian Furgason/Extender:[name], [name], [name] in Treatment: 213 Compression  Therapy Performed for Wound NonWound Condition Lymphedema - Right Leg Assessment: Performed By: Clinician [name], RN Compression Type: Henriette Combs Electronic Signature(s) Signed: 02/23/2020 10:53:23 AM By: Shawn Stall Entered By: Shawn Stall on 02/23/2020 10:39:33 -------------------------------------------------------------------------------- Compression Therapy Details Patient Name: Date of Service: YVONNE, PETITE 02/23/2020 10:30 AM Medical Record [id number] Patient Account Number: [id number] Date of Birth/Sex: Treating RN: 1950-11-16 (69 y.o. [name] Primary Care Carlen Rebuck: Nicoletta Ba Other Clinician: Referring Jaydyn Menon: Treating Maylani Embree/Extender:[name], [name], [name] in Treatment: 213 Compression Therapy Performed for Wound NonWound Condition Lymphedema - Left Leg Assessment: Performed By: Clinician [name], RN Compression Type: Four Layer Electronic Signature(s) Signed: 02/23/2020 10:53:23 AM By: Shawn Stall Entered By: Shawn Stall on 02/23/2020 10:39:55 -------------------------------------------------------------------------------- Encounter Discharge Information Details Patient Name: Date of Service: [name] 02/23/2020 10:30 AM Medical Record [id number] Patient Account Number: [id number] Date of Birth/Sex: Treating RN: 1950-12-29 (69 y.o. [name] Primary Care Korie Brabson: Nicoletta Ba Other Clinician: Referring Joretta Eads: Treating Montrelle Eddings/Extender:[name], [name], [name] in Treatment: 213 Encounter Discharge Information Items Discharge Condition: Stable Ambulatory Status: Walker Discharge Destination: Home Transportation: Private Auto Accompanied By: self Schedule Follow-up Appointment: Yes Clinical Summary of Care: Patient Declined Electronic Signature(s) Signed: 02/23/2020 10:53:23 AM By: Shawn Stall Entered By: Shawn Stall on 02/23/2020  10:42:07 -------------------------------------------------------------------------------- Patient/Caregiver Education Details Patient Name: Date of Service: [name] 4/21/2021andnbsp10:30 AM Medical Record [id number] Patient Account Number: [id number] Date of Birth/Gender: Treating RN: 08-23-1951 (69 y.o. [name] Primary Care Physician: Nicoletta Ba Other Clinician: Referring Physician: Treating Physician/Extender:[name], [name], [name] in Treatment: 213 Education Assessment Education Provided To: Patient Education Topics Provided Wound/Skin Impairment: Methods: Explain/Verbal Responses: State content correctly Electronic Signature(s) Signed: 02/23/2020 10:53:23 AM By: Shawn Stall Entered By: Shawn Stall on 02/23/2020 10:41:38 -------------------------------------------------------------------------------- Wound Assessment Details Patient Name: Date of Service: QUNICY, HIGINBOTHAM 02/23/2020 10:30 AM Medical Record [id number] Patient Account Number: [id number] Date of Birth/Sex: Treating RN: 1951/07/09 (69 y.o. [name] Primary Care  Jolan Upchurch: [name] Other Clinician: Referring Asra Gambrel: Treating Harlo Fabela/Extender:[name], Dema Severin, [name] in Treatment: 213 Wound Status Wound Number: 162 Primary Lymphedema Etiology: Wound Location: Right Toe Second Wound Open Wounding Event: Gradually Appeared Status: Date Acquired: 01/25/2020 Comorbid Chronic sinus problems/congestion, Weeks Of Treatment: 4 History: Arrhythmia, Hypertension, Peripheral Arterial Clustered Wound: No Disease, Type II Diabetes, History of Burn, Gout, Confinement Anxiety Wound Measurements Length: (cm) 0.4 % Reductio Width: (cm) 1.3 % Reductio Depth: (cm) 0.1 Epithelial Area: (cm) 0.408 Tunneling Volume: (cm) 0.041 Undermini Wound Description Classification: Partial Thickness Wound Margin: Distinct, outline attached Exudate  Amount: Medium Exudate Type: Serosanguineous Exudate Color: red, brown Wound Bed Granulation Amount: Large (67-100%) Granulation Quality: Pink Necrotic Amount: None Present (0%) Foul Odor After Cleansing: No Slough/Fibrino No Exposed Structure Fascia Exposed: No Fat Layer (Subcutaneous Tissue) Exposed: No Tendon Exposed: No Muscle Exposed: No Joint Exposed: No Bone Exposed: No n in Area: -189.4% n in Volume: -192.9% ization: Medium (34-66%) : No ng: No Treatment Notes Wound #162 (Right Toe Second) 1. Cleanse With Wound Cleanser Soap and water 3. Primary Dressing Applied Calcium Alginate Ag 4. Secondary Dressing Dry Gauze 5. Secured With Recruitment consultant) Signed: 02/23/2020 10:53:23 AM By: Deon Pilling Signed: 02/23/2020 11:18:43 AM By: Levan Hurst RN, BSN Entered By: Deon Pilling on 02/23/2020 10:37:32 -------------------------------------------------------------------------------- Wound Assessment Details Patient Name: Date of Service: DELMA, VILLALVA 02/23/2020 10:30 AM Medical Record [id number] Patient Account Number: [id number] Date of Birth/Sex: Treating RN: December 15, 1950 (69 y.o. [name] Primary Care Azari Janssens: [name] Other Clinician: Referring Kelon Easom: Treating Shalayna Ornstein/Extender:[name], Dema Severin, [name] in Treatment: 213 Wound Status Wound Number: 165 Primary Lymphedema Etiology: Wound Location: Left Toe Great Wound Open Wounding Event: Gradually Appeared Status: Date Acquired: 02/02/2020 Comorbid Chronic sinus problems/congestion, Weeks Of Treatment: 3 History: Arrhythmia, Hypertension, Peripheral Arterial Clustered Wound: No Disease, Type II Diabetes, History of Burn, Gout, Confinement Anxiety Wound Measurements Length: (cm) 0.9 % Reduct Width: (cm) 1.1 % Reduct Depth: (cm) 0.1 Epitheli Area: (cm) 0.778 Tunneli Volume: (cm) 0.078 Undermi Wound Description Full Thickness Without Exposed  Support Foul Odo Classification: Structures Slough/F Wound Flat and Intact Margin: Exudate Medium Amount: Exudate Serous Type: Exudate amber Color: Wound Bed Granulation Amount: Large (67-100%) Granulation Quality: Pink Fascia E Necrotic Amount: None Present (0%) Fat Laye Tendon E Muscle E Joint Ex Bone Exp r After Cleansing: No ibrino No Exposed Structure xposed: No r (Subcutaneous Tissue) Exposed: No xposed: No xposed: No posed: No osed: No ion in Area: -76.8% ion in Volume: -77.3% alization: Medium (34-66%) ng: No ning: No Treatment Notes Wound #165 (Left Toe Great) 1. Cleanse With Wound Cleanser Soap and water 3. Primary Dressing Applied Calcium Alginate Ag 4. Secondary Dressing Dry Gauze 5. Secured With Recruitment consultant) Signed: 02/23/2020 10:53:23 AM By: Deon Pilling Signed: 02/23/2020 11:18:43 AM By: Levan Hurst RN, BSN Entered By: Deon Pilling on 02/23/2020 10:38:22 -------------------------------------------------------------------------------- Vitals Details Patient Name: Date of Service: STEPHEN, BARUCH 02/23/2020 10:30 AM Medical Record [id number] Patient Account Number: [id number] Date of Birth/Sex: Treating RN: 12-Feb-1951 (69 y.o. [name] Primary Care Jameson Morrow: [name] Other Clinician: Referring Prabhleen Montemayor: Treating Vernella Niznik/Extender:[name], Dema Severin, [name] in Treatment: 213 Vital Signs Time Taken: 10:28 Temperature (F): 98.4 Height (in): 70 Pulse (bpm): 59 Weight (lbs): 380.2 Respiratory Rate (breaths/min): 22 Body Mass Index (BMI): 54.5 Blood Pressure (mmHg): 141/60 Reference Range: 80 - 120 mg / dl Electronic Signature(s) Signed: 02/23/2020 10:53:23 AM By: Deon Pilling Entered By: Deon Pilling  on 02/23/2020 10:29:28

## 2020-02-25 ENCOUNTER — Telehealth: Payer: Self-pay

## 2020-02-25 DIAGNOSIS — I890 Lymphedema, not elsewhere classified: Secondary | ICD-10-CM | POA: Diagnosis not present

## 2020-02-25 DIAGNOSIS — R29898 Other symptoms and signs involving the musculoskeletal system: Secondary | ICD-10-CM | POA: Diagnosis not present

## 2020-02-25 NOTE — Telephone Encounter (Signed)
Received written order for above knee compression, Placed on PCP desk to review and sign, if appropriate.

## 2020-02-28 ENCOUNTER — Other Ambulatory Visit: Payer: Self-pay | Admitting: Family Medicine

## 2020-03-07 NOTE — Progress Notes (Signed)
[name], [name] ([id number]) Visit Report for 02/23/2020 SuperBill Details Patient Name: Date of Service: [name], [name] 02/23/2020 Medical Record Number: [id number] Patient Account Number: [id number] Date of Birth/Sex: Treating RN: 1951/09/14 (69 y.o. 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

## 2020-03-08 ENCOUNTER — Other Ambulatory Visit: Payer: Self-pay

## 2020-03-08 ENCOUNTER — Encounter (HOSPITAL_BASED_OUTPATIENT_CLINIC_OR_DEPARTMENT_OTHER): Payer: Medicare Other | Attending: Physician Assistant | Admitting: Physician Assistant

## 2020-03-08 ENCOUNTER — Ambulatory Visit (INDEPENDENT_AMBULATORY_CARE_PROVIDER_SITE_OTHER): Payer: Medicare Other | Admitting: Family Medicine

## 2020-03-08 DIAGNOSIS — E1140 Type 2 diabetes mellitus with diabetic neuropathy, unspecified: Secondary | ICD-10-CM | POA: Diagnosis not present

## 2020-03-08 DIAGNOSIS — L03119 Cellulitis of unspecified part of limb: Secondary | ICD-10-CM

## 2020-03-08 DIAGNOSIS — L97221 Non-pressure chronic ulcer of left calf limited to breakdown of skin: Secondary | ICD-10-CM | POA: Diagnosis not present

## 2020-03-08 DIAGNOSIS — L97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 right calf limited to breakdown of skin: Secondary | ICD-10-CM | POA: Diagnosis not present

## 2020-03-08 DIAGNOSIS — I890 Lymphedema, not elsewhere classified: Secondary | ICD-10-CM | POA: Insufficient documentation

## 2020-03-08 DIAGNOSIS — L03116 Cellulitis of left lower limb: Secondary | ICD-10-CM | POA: Insufficient documentation

## 2020-03-08 DIAGNOSIS — I87333 Chronic venous hypertension (idiopathic) with ulcer and inflammation of bilateral lower extremity: Secondary | ICD-10-CM | POA: Diagnosis not present

## 2020-03-08 DIAGNOSIS — E11622 Type 2 diabetes mellitus with other skin ulcer: Secondary | ICD-10-CM | POA: Diagnosis not present

## 2020-03-08 DIAGNOSIS — L97811 Non-pressure chronic ulcer of other part of right lower leg limited to breakdown of skin: Secondary | ICD-10-CM | POA: Diagnosis not present

## 2020-03-08 DIAGNOSIS — L97821 Non-pressure chronic ulcer of other part of left lower leg limited to breakdown of skin: Secondary | ICD-10-CM | POA: Diagnosis not present

## 2020-03-08 NOTE — Progress Notes (Signed)
Mclean Moya Cowperis a 68 y.o.malepresents to the office today for bicillininjections, per physician's orders.  Bicillin 600,000 Unitswas administeredbilaterally in Right and Left upper outter quadtoday. Patient tolerated injection.  Patient due for follow up labs/provider appt:Yes. Date due:04/06/20, appt madeYes Next injection due: 04/06/20, appt made: yes- with provider  Misty Stanley, CMA
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
## 2020-03-08 NOTE — Progress Notes (Addendum)
TOMAZ, JANIS ([id number]) Visit Report for 03/08/2020 Chief Complaint Document Details Patient Name: Date of Service: CO GERAN, HAITHCOCK 03/08/2020 10:30 A M Medical Record Number: [id number] Patient Account Number: [id number] Date of Birth/Sex: Treating RN: [birth year] (69 y.o. [name] Primary Care Provider: Marney Setting, [name] Other Clinician: Referring Provider: Treating Provider/Extender: Lenda Kelp [facility] [name], [name] in Treatment: 215 Information Obtained from: Patient Chief Complaint patient is here for evaluation venous/lymphedema weeping Electronic Signature(s) Signed: 03/08/2020 11:29:45 AM By: Lenda Kelp PA-C Entered By: Lenda Kelp on 03/08/2020 11:29:45 -------------------------------------------------------------------------------- HPI Details Patient Name: Date of Service: [name], Jesua J. 03/08/2020 10:30 A M Medical Record Number: [id number] Patient Account Number: [id number] Date of Birth/Sex: Treating RN: [birth year] (69 y.o. [name] Primary Care Provider: Marney Setting, [name] Other Clinician: Referring Provider: Treating Provider/Extender: Lenda Kelp [facility] [name], [name] in Treatment: 215 History of Present Illness HPI Description: Referred by PCP for consultation. Patient has long standing history of BLE venous stasis, no prior ulcerations. At beginning of month, developed cellulitis and weeping. Received IM Rocephin followed by Keflex and resolved. Wears compression stocking, appr 6 months old. Not sure strength. No present drainage. 01/22/16 this is a patient who is a type II diabetic on insulin. 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 He tells me he religiously wears pressure stockings of uncertain strength. He was here with weeping edema about 8 months ago but did not have an open wound. Roughly a month ago he had a reopening on his bilateral legs. He is been using bandages and Neosporin. He  does not complain of pain. 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 Last BUN/creatinine I have is from 11/20/15 at 13 and 1.0 respectively 01/29/16; patient arrives today having tolerated the Profore wrap. He brought in his stockings and these are 18 mmHg stockings he bought from [place]. The compression here is likely inadequate. He does not complain of pain or excessive drainage she has no systemic symptoms. The wound on the right looks improved as does the one on the left although one on the left is more substantial with still tissue at risk below the actual wound area on the bilateral posterior calf 02/05/16; patient arrives with poor edema control. He states that we did put a 4 layer compression on it last week. No weight appear 5 this. 02/12/16; the area on the posterior right Has healed. The left Has a substantial wound that has necrotic surface eschar that requires a debridement with a curette. 02/16/16;the patient called or a Nurse visit secondary to increased swelling. He had been in earlier in the week with his right leg healed. He was transitioned to is on pressure stocking on the right leg with the only open wound on the left, a substantial area on the left posterior calf. Note he has a history of severe lower extremity edema, he has a history of chronic atrial fibrillation but not heart failure per my notes but I'll need to research this. He is not complaining of chest pain shortness of breath or orthopnea. The intake nurse noted blisters on the previously closed right leg 02/19/16; this is the patient's regular visit day. I see him on Friday with escalating edema new wounds on the right leg and clear signs of at least right ventricular heart failure. I increased his Lasix to  40 twice a day. He is returning currently in follow-up. States he is noticed a decrease in that the  edema 02/26/16 patient's legs have much less edema. There is nothing really open on the right leg. The left leg has improved condition of the large superficial wound on the posterior left leg 03/04/16; edema control is very much better. The patient's right leg wounds have healed. On the left leg he continues to have severe venous inflammation on the posterior aspect of the left leg. There is no tenderness and I don't think any of this is cellulitis. 03/11/16; patient's right leg is married healed and he is in his own stocking. The patient's left leg has deteriorated somewhat. There is a lot of erythema around the wound on the posterior left leg. There is also a significant rim of erythema posteriorly just above where the wrap would've ended there is a new wound in this location and a lot of tenderness. Can't rule out cellulitis in this area. 03/15/16; patient's right leg remains healed and he is in his own stocking. The patient's left leg is much better than last review. His major wound on the posterior aspect of his left Is almost fully epithelialized. He has 3 small injuries from the wraps. Really. Erythema seems a lot better on antibiotics 03/18/16; right leg remains healed and he is in his own stocking. The patient's left leg is much better. The area on the posterior aspect of the left calf is fully epithelialized. His 3 small injuries which were wrap injuries on the left are improved only one seems still open his erythema has resolved 03/25/16; patient's right leg remains healed and he is in his own stocking. There is no open area today on the left leg posterior leg is completely closed up. His wrap injuries at the superior aspect of his leg are also resolved. He looks as though he has some irritation on the dorsal ankle but this is fully epithelialized without evidence of infection. 03/28/16; we discharged this patient on Monday. Transitioned him into his own stocking. There were problems almost  immediately with uncontrolled swelling weeping edema multiple some of which have opened. He does not feel systemically unwell in particular no chest pain no shortness of breath and he does not feel 04/08/16; the edema is under better control with the Profore light wrap but he still has pitting edema. There is one large wound anteriorly 2 on the medial aspect of his left leg and 3 small areas on the superior posterior calf. Drainage is not excessive he is tolerating a Profore light well 04/15/16; put a Profore wrap on him last week. This is controlled is edema however he had a lot of pain on his left anterior foot most of his wounds are healed 04/22/16 once again the patient has denuded areas on the left anterior foot which he states are because his wrap slips up word. He saw his primary physician today is on Lasix 40 twice a day and states that he his weight is down 20 pounds over the last 3 months. 04/29/16: Much improved. left anterior foot much improved. He is now on Lasix 80 mg per day. Much improved edema control 05/06/16; I was hoping to be able to discharge him today however once again he has blisters at a low level of where the compression was placed last week mostly on his left lateral but also his left medial leg and a small area on the anterior part of the left foot. 05/09/16;  apparently the patient went home after his appointment on 7/4 later in the evening developing pain in his upper medial thigh together with subjective fever and chills although his temperature was not taken. The pain was so intense he felt he would probably have to call 911. However he then remembered that he had leftover doxycycline from a previous round of antibiotics and took these. By the next morning he felt a lot better. He called and spoke to one of our nurses and I approved doxycycline over the phone thinking that this was in relation to the wounds we had previously seen although they were definitely were not. The  patient feels a lot better old fever no chills he is still working. Blood sugars are reasonably controlled 05/13/16; patient is back in for review of his cellulitis on his anterior medial upper thigh. He is taking doxycycline this is a lot better. Culture I did of the nodular area on the dorsal aspect of his foot grew MRSA this also looks a lot better. 05/20/16; the patient is cellulitis on the medial upper thigh has resolved. All of his wound areas including the left anterior foot, areas on the medial aspect of the left calf and the lateral aspect of the calf at all resolved. He has a new blister on the left dorsal foot at the level of the fourth toe this was excised. No evidence of infection 05/27/16; patient continues to complain weeping edema. He has new blisterlike wounds on the left anterior lateral and posterior lateral calf at the top of his wrap levels. The area on his left anterior foot appears better. He is not complaining of fever, pain or pruritus in his feet. 05/30/16; the patient's blisters on his left anterior leg posterior calf all look improved. He did not increase the Lasix 100 mg as I suggested because he was going to run out of his 40 mg tablets. He is still having weeping edema of his toes 06/03/16; I renewed his Lasix at 80 mg once a day as he was about to run out when I last saw him. He is on 80 mg of Lasix now. I have asked him to cut down on the excessive amount of water he was drinking and asked him to drink according to his thirst mechanisms 06/12/2016 -- was seen 2 days ago and was supposed to wear his compression stockings at home but he is developed lymphedema and superficial blisters on the left lower extremity and hence came in for a review 06/24/16; the remaining wound is on his left anterior leg. He still has edema coming from between his toes. There is lymphedema here however his edema is generally better than when I last saw this. He has a history of atrial fibrillation  but does not have a known history of congestive heart failure nevertheless I think he probably has this at least on a diastolic basis. 07/01/16 I reviewed his echocardiogram from January 2017. This was essentially normal. He did not have LVH, EF of 55-60%. His right ventricular function was normal although he did have trivial tricuspid and pulmonic regurgitation. This is not audible on exam however. I increased his Lasix to do massive edema in his legs well above his knees I think in early July. He was also drinking an excessive amount of water at the time. 07/15/16; missed his appointment last week because of the Labor Day holiday on Monday. He could not get another appointment later in the week. Started to feel the wrap  digging in superiorly so we remove the top half and the bottom half of his wrap. He has extensive erythema and blistering superiorly in the left leg. Very tender. Very swollen. Edema in his foot with leaking edema fluid. He has not been systemically unwell 07/22/16; the area on the left leg laterally required some debridement. The medial wounds look more stable. His wrap injury wounds appear to have healed. Edema and his foot is better, weeping edema is also better. He tells me he is meeting with the supplier of the external compression pumps at work 08/05/16; the patient was on vacation last week in [facility]. His wrap is been on for an extended period of time. Also over the weekend he developed an extensive area of tender erythema across his anterior medial thigh. He took to doxycycline yesterday that he had leftover from a previous prescription. The patient complains of weeping edema coming out of his toes 08/08/16; I saw this patient on 10/2. He was tender across his anterior thigh. I put him on doxycycline. He returns today in follow-up. He does not have any open wounds on his lower leg, he still has edema weeping into his toes. 08/12/16; patient was seen back urgently today to  follow-up for his extensive left thigh cellulitis/erysipelas. He comes back with a lot less swelling and erythema pain is much better. I believe I gave him Augmentin and Cipro. His wrap was cut down as he stated a roll down his legs. He developed blistering above the level of the wrap that remained. He has 2 open blisters and 1 intact. 08/19/16; patient is been doing his primary doctor who is increased his Lasix from 40-80 once a day or 80 already has less edema. Cellulitis has remained improved in the left thigh. 2 open areas on the posterior left calf 08/26/16; he returns today having new open blisters on the anterior part of his left leg. He has his compression pumps but is not yet been shown how to use some vital representative from the supplier. 09/02/16 patient returns today with no open wounds on the left leg. Some maceration in his plantar toes 09/10/2016 -- Dr. Leanord Hawking had recently discharged him on 09/02/2016 and he has come right back with redness swelling and some open ulcers on his left lower extremity. He says this was caused by trying to apply his compression stockings and he's been unable to use this and has not been able to use his lymphedema pumps. He had some doxycycline leftover and he has started on this a few days ago. 09/16/16; there are no open wounds on his leg on the left and no evidence of cellulitis. 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 He does not complain of symptoms here. I am not clear use using his external compression pumps. 09/23/16; I have not seen this patient in 2 weeks. He canceled his appointment 10 days ago as he was going on vacation. He tells me that on Monday he noticed a large area on his posterior left leg which is been draining copiously and is reopened into a large wound. He is been using ABDs and the external part of his juxtalite, according to our nurse this was not on properly. 10/07/16;  Still a substantial area on the posterior left leg. Using silver alginate 10/14/16; in general better although there is still open area which looks healthy. Still using silver alginate. He reminds me that this happen before he  left for [facility]. T oday while he was showering in the morning. He had been using his juxtalite's 10/21/16; the area on his posterior left leg is fully epithelialized. However he arrives today with a large area of tender erythema in his medial and posterior left thigh just above the knee. I have marked the area. Once again he is reluctant to consider hospitalization. I treated him with oral antibiotics in the past for a similar situation with resolution I think with doxycycline however this area it seems more extensive to me. He is not complaining of fever but does have chills and says states he is thirsty. His blood sugar today was in the 140s at home 10/25/16 the area on his posterior left leg is fully epithelialized although there is still some weeping edema. The large area of tenderness and erythema in his medial and posterior left thigh is a lot less tender although there is still a lot of swelling in this thigh. He states he feels a lot better. He is on doxycycline and Augmentin that I started last week. This will continued until Tuesday, December 26. I have ordered a duplex ultrasound of the left thigh rule out DVT whether there is an abscess something that would need to be drained I would also like to know. 11/01/16; he still has weeping edema from a not fully epithelialized area on his left posterior calf. Most of the rest of this looks a lot better. He has completed his antibiotics. His thigh is a lot better. Duplex ultrasound did not show a DVT in the thigh 11/08/16; he comes in today with more Denuded surface epithelium from the posterior aspect of his calf. There is no real evidence of cellulitis. The superior aspect of his wrap appears to have put quite an  indentation in his leg just below the knee and this may have contributed. He does not complain of pain or fever. We have been using silver alginate as the primary dressing. The area of cellulitis in the right thigh has totally resolved. He has been using his compression stockings once a week 11/15/16; the patient arrives today with more loss of epithelium from the posterior aspect of his left calf. He now has a fairly substantial wound in this area. The reason behind this deterioration isn't exactly clear although his edema is not well controlled. He states he feels he is generally more swollen systemically. He is not complaining of chest pain shortness of breath fever. T me he has an appointment with his primary physician in early February. He is on 80 mg of oral ells Lasix a day. He claims compliance with the external compression pumps. He is not having any pain in his legs similar to what he has with his recurrent cellulitis 11/22/16; the patient arrives a follow-up of his large area on his left lateral calf. This looks somewhat better today. He came in earlier in the week for a dressing change since I saw him a week ago. He is not complaining of any pain no shortness of breath no chest pain 11/28/16; the patient arrives for follow-up of his large area on the left lateral calf this does not look better. In fact it is larger weeping edema. The surface of the wound does not look too bad. We have been using silver alginate although I'm not certain that this is a dressing issue. 12/05/16; again the patient follows up for a large wound on the left lateral and left posterior calf this does not  look better. There continues to be weeping edema necrotic surface tissue. More worrisome than this once again there is erythema below the wound involving the distal Achilles and heel suggestive of cellulitis. He is on his feet working most of the day of this is not going well. We are changing his dressing twice a week to  facilitate the drainage. 12/12/16; not much change in the overall dimensions of the large area on the left posterior calf. This is very inflamed looking. I gave him an. Doxycycline last week does not really seem to have helped. He found the wrap very painful indeed it seems to of dog into his legs superiorly and perhaps around the heel. He came in early today because the drainage had soaked through his dressings. 12/19/16- patient arrives for follow-up evaluation of his left lower extremity ulcers. He states that he is using his lymphedema pumps once daily when there is "no drainage". He admits to not using his lipedema pumps while under current treatment. His blood sugars have been consistently between 150-200. 12/26/16; the patient is not using his compression pumps at home because of the wetness on his feet. I've advised him that I think it's important for him to use this daily. He finds his feet too wet, he can put a plastic bag over his legs while he is in the pumps. Otherwise I think will be in a vicious circle. We are using silver alginate to the major area on his left posterior calf 01/02/17; the patient's posterior left leg has further of all into 3 open wounds. All of them covered with a necrotic surface. He claims to be using his compression pumps once a day. His edema control is marginal. Continue with silver alginate 01/10/17; the patient's left posterior leg actually looks somewhat better. There is less edema, less erythema. Still has 3 open areas covered with a necrotic surface requiring debridement. He claims to be using his compression pumps once a day his edema control is better 01/17/17; the patient's left posterior calf look better last week when I saw him and his wrap was changed 2 days ago. He has noted increasing pain in the left heel and arrives today with much larger wounds extensive erythema extending down into the entire heel area especially tender medially. He is not  systemically unwell CBGs have been controlled no fever. Our intake nurse showed me limegreen drainage on his AVD pads. 01/24/17; his usual this patient responds nicely to antibiotics last week giving him Levaquin for presumed Pseudomonas. The whole entire posterior part of his leg is much better much less inflamed and in the case of his Achilles heel area much less tender. He has also had some epithelialization posteriorly there are still open areas here and still draining but overall considerably better 01/31/17- He has continue to tolerate the compression wraps. he states that he continues to use the lymphedema pumps daily, and can increase to twice daily on the weekends. He is voicing no complaints or concerns regarding his LLE ulcers 02/07/17-he is here for follow-up evaluation. He states that he noted some erythema to the left medial and anterior thigh, which he states is new as of yesterday. He is concerned about recurrent cellulitis. He states his blood sugars have been slightly elevated, this morning in the 180s 02/14/17; he is here for follow-up evaluation. When he was last here there was erythema superiorly from his posterior wound in his anterior thigh. He was prescribed Levaquin however a culture of the wound surface  grew MRSA over the phone I changed him to doxycycline on Monday and things seem to be a lot better. 02/24/17; patient missed his appointment on Friday therefore we changed his nurse visit into a physician visit today. Still using silver alginate on the large area of the posterior left thigh. He isn't new area on the dorsal left second toe 03/03/17; actually better today although he admits he has not used his external compression pumps in the last 2 days or so because of work responsibilities over the weekend. 03/10/17; continued improvement. External compression pumps once a day almost all of his wounds have closed on the posterior left calf. Better edema control 03/17/17; in general  improved. He still has 3 small open areas on the lateral aspect of his left leg however most of the area on the posterior part of his leg is epithelialized. He has better edema control. He has an ABD pad under his stocking on the right anterior lower leg although he did not let us look at that today. 03/24/17; patient arrives back in clinic today with no open areas however there are areas on the posterior left calf and anterior left calf that are less than 100% epithelialized. His edema is well controlled in the left lower leg. There is some pitting edema probably lymphedema in the left upper thigh. He uses compression pumps at home once per day. I tried to get him to do this twice a day although he is very reticent. 04/01/2017 -- for the last 2 days he's had significant redness, tenderness and weeping and came in for an urgent visit today. 04/07/17; patient still has 6 more days of doxycycline. He was seen by Dr. Meyer Russel last Wednesday for cellulitis involving the posterior aspect, lateral aspect of his Involving his heel. For the most part he is better there is less erythema and less weeping. He has been on his feet for 12 hours 2 over the weekend. Using his compression pumps once a day 04/14/17 arrives today with continued improvement. Only one area on the posterior left calf that is not fully epithelialized. He has intense bilateral venous inflammation associated with his chronic venous insufficiency disease and secondary lymphedema. We have been using silver alginate to the left posterior calf wound In passing he tells [place] today that the right leg but we have not seen in quite some time has an open area on it but he doesn't want [place] to look at this today states he will show this to [place] next week. 04/21/17; there is no open area on his left leg although he still reports some weeping edema. He showed [place] his right leg today which is the first time we've seen this leg in a long time. He has a large area of  open wound on the right leg anteriorly healthy granulation. Quite a bit of swelling in the right leg and some degree of venous inflammation. He told us about the right leg in passing last week but states that deterioration in the right leg really only happened over the weekend 04/28/17; there is no open area on the left leg although there is an irritated part on the posterior which is like a wrap injury. The wound on the right leg which was new from last week at least to [place] is a lot better. 05/05/17; still no open area on the left leg. Patient is using his new compression stocking which seems to be doing a good job of controlling the edema. He states he  is using his compression pumps once per day. The right leg still has an open wound although it is better in terms of surface area. Required debridement. A lot of pain in the posterior right Achilles marked tenderness. Usually this type of presentation this patient gives concern for an active cellulitis 05/12/17; patient arrives today with his major wound from last week on the right lateral leg somewhat better. Still requiring debridement. He was using his compression stocking on the left leg however that is reopened with superficial wounds anteriorly he did not have an open wound on this leg previously. He is still using his juxta light's once daily at night. He cannot find the time to do this in the morning as he has to be at work by 7 AM 05/19/17; right lateral leg wound looks improved. No debridement required. The concerning area is on the left posterior leg which appears to almost have a subcutaneous hemorrhagic component to it. We've been using silver alginate to all the wounds 05/26/17; the right lateral leg wound continues to look improved. However the area on the left posterior calf is a tightly adherent surface. [place] using silver alginate. Because of the weeping edema in his legs there is very little good alternatives. 06/02/17; the patient left  here last week looking quite good. Major wound on the left posterior calf and a small one on the right lateral calf. Both of these look satisfactory. He tells me that by Wednesday he had noted increased pain in the left leg and drainage. He called on Thursday and Friday to get an appointment here but we were blocked. He did not go to urgent care or his primary physician. He thinks he had a fever on Thursday but did not actually take his temperature. He has not been using his compression pumps on the left leg because of pain. I advised him to go to the emergency room today for IV antibiotics for stents of left leg cellulitis but he has refused I have asked him to take 2 days off work to keep his leg elevated and he has refused this as well. In view of this I'm going to call him and Augmentin and doxycycline. 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 However after a detailed discussion regarding the risk benefits and alternatives he refuses to get admitted to the hospital. With no other recourse I will continue him on oral antibiotics as before and hopefully he'll have his infectious disease consultation this week. 06/16/2017 -- the patient was seen today by the nurse practitioner at infectious disease Ms. Dixon. 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 He has also been advise Lotrimin cream locally. she also had wise IV antibiotics for his condition if it flares up 06/23/17; patient arrives today with drainage bilaterally although the remaining wound on the left posterior calf after  cleaning up today "highlighter yellow drainage" did not look too bad. Unfortunately he has had breakdown on the right anterior leg [previously this leg had not been open and he is using a black stocking] he went  to see infectious disease and is been put on clindamycin 150 mg daily, I did not verify the dose although I'm not familiar with using clindamycin in this dosing range, perhaps for prophylaxisoo 06/27/17; I brought this patient back today to follow-up on the wound deterioration on the right lower leg together with surrounding cellulitis. I started him on doxycycline 4 days ago. This area looks better however he comes in today with intense cellulitis on the medial part of his left thigh. This is not have a wound in this area. Extremely tender. We've been using silver alginate to the wounds on the right lower leg left lower leg with bilateral 4 layer compression he is using his external compression pumps once a day 07/04/17; patient's left medial thigh cellulitis looks better. He has not been using his compression pumps as his insert said it was contraindicated with cellulitis. His right leg continues to make improvements all the wounds are still open. We only have one remaining wound on the left posterior calf. Using silver alginate to all open areas. He is on doxycycline which I started a week ago and should be finishing I gave him Augmentin after Thursday's visit for the severe cellulitis on the left medial thigh which fortunately looks better 07/14/17; the patient's left medial thigh cellulitis has resolved. The cellulitis in his right lower calf on the right also looks better. All of his wounds are stable to improved we've been using silver alginate he has completed the antibiotics I have given him. He has clindamycin 150 mg once a day prescribed by infectious disease for prophylaxis, I've advised him to start this now. We have been using bilateral Unna boots over silver alginate to the wound  areas 07/21/17; the patient is been to see infectious disease who noted his recurrent problems with cellulitis. He was not able to tolerate prophylactic clindamycin therefore he is on amoxicillin 500 twice a day. He also had a second daily dose of Lasix added By Dr. Oneta Rack but he is not taking this. Nor is he being completely compliant with his compression pumps a especially not this week. He has 2 remaining wounds one on the right posterior lateral lower leg and one on the left posterior medial lower leg. 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 The patient has Unna boots bilaterally. 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 He has bilateral Unna boots for compression and silver alginate to his wounds. Arrives today with his legs looking as good as I have seen him in quite some time. Not surprisingly his wounds look better as well with improvement on the right lateral leg venous insufficiency wound and also the left medial leg. He is still using the compression pumps once a day 08/11/17; both legs appear to be doing better wounds on the right lateral and left medial legs look better. Skin on the right leg quite good. He is been using silver alginate as the primary dressing. I'm going to use Anasept gel calcium alginate and maintain all the secondary dressings 08/18/17; the patient continues to actually do quite well. The area on his right lateral leg is just about closed the left medial also looks better although it is still moist in this area. His edema is well controlled we have been using Anasept gel  with calcium alginate and the usual secondary dressings, 4 layer compression and once daily use of his compression pumps "always been able to manage 09/01/17; the patient  continues to do reasonably well in spite of his trip to [place]. The area on the right lateral leg is epithelialized. Left is much better but still open. He has more edema and more chronic erythema on the left leg [venous inflammation] 09/08/17; he arrives today with no open wound on the right lateral leg and decently controlled edema. Unfortunately his left leg is not nearly as in his good situation as last week.he apparently had increasing edema starting on Saturday. He edema soaked through into his foot so used a plastic bag to walk around his home. The area on the medial right leg which was his open area is about the same however he has lost surface epithelium on the left lateral which is new and he has significant pain in the Achilles area of the left foot. He is already on amoxicillin chronically for prophylaxis of cellulitis in the left leg 09/15/17; he is completed a week of doxycycline and the cellulitis in the left posterior leg and Achilles area is as usual improved. He still has a lot of edema and fluid soaking through his dressings. There is no open wound on the right leg. He saw infectious disease NP today 09/22/17;As usual 1 we transition him from our compression wraps to his stockings things did not go well. He has several small open areas on the right leg. He states this was caused by the compression wrap on his skin although he did not wear this with the stockings over them. He has several superficial areas on the left leg medially laterally posteriorly. He does not have any evidence of active cellulitis especially involving the left Achilles The patient is traveling from [facility] Saturday going to [facility]. He states he isn't attempting to get an appointment with a heel objects wound center there to change his dressings. I am not completely certain whether this will work 10/06/17; the patient came in on Friday for a nurse visit and the nurse reported that his legs actually look  quite good. He arrives in clinic today for his regular follow-up visit. He has a new wound on his left third toe over the PIP probably caused by friction with his footwear. He has small areas on the left leg and a very superficial but epithelialized area on the right anterior lateral lower leg. Other than that his legs look as good as I've seen him in quite some time. We have been using silver alginate Review of systems; no chest pain no shortness of breath other than this a 10 point review of systems negative 10/20/17; seen by Dr. Meyer Russel last week. He had taken some antibiotics [doxycycline] that he had left over. Dr. Meyer Russel thought he had candida infection and declined to give him further antibiotics. He has a small wound remaining on the right lateral leg several areas on the left leg including a larger area on the left posterior several left medial and anterior and a small wound on the left lateral. The area on the left dorsal third toe looks a lot better. ROS; Gen.; no fever, respiratory no cough no sputum Cardiac no chest pain other than this 10 point review of system is negative 10/30/17; patient arrives today having fallen in the bathtub 3 days ago. It took him a while to get up. He has pain and maceration in the  wounds on his left leg which have deteriorated. He has not been using his pumps he also has some maceration on the right lateral leg. 11/03/17; patient continues to have weeping edema especially in the left leg. This saturates his dressings which were just put on on 12/27. As usual the doxycycline seems to take care of the cellulitis on his lower leg. He is not complaining of fever, chills, or other systemic symptoms. He states his leg feels a lot better on the doxycycline I gave him empirically. He also apparently gets injections at his primary doctor's officeo Rocephin for cellulitis prophylaxis. I didn't ask him about his compression pump compliance today I think that's probably  marginal. Arrives in the clinic with all of his dressings primary and secondary macerated full of fluid and he has bilateral edema 11/10/17; the patient's right leg looks some better although there is still a cluster of wounds on the right lateral. The left leg is inflamed with almost circumferential skin loss medially to laterally although we are still maintaining anteriorly. He does not have overt cellulitis there is a lot of drainage. He is not using compression pumps. We have been using silver alginate to the wound areas, there are not a lot of options here 11/17/17; the patient's right leg continues to be stable although there is still open wounds, better than last week. The inflammation in the left leg is better. Still loss of surface layer epithelium especially posteriorly. There is no overt cellulitis in the amount of edema and his left leg is really quite good, tells me he is using his compression pumps once a day. 11/24/17; patient's right leg has a small superficial wound laterally this continues to improve. The inflammation in the left leg is still improving however we have continuous surface layer epithelial loss posteriorly. There is no overt cellulitis in the amount of edema in both legs is really quite good. He states he is using his compression pumps on the left leg once a day for 5 out of 7 days 12/01/17; very small superficial areas on the right lateral leg continue to improve. Edema control in both legs is better today. He has continued loss of surface epithelialization and left posterior calf although I think this is better. We have been using silver alginate with large number of absorptive secondary dressings 4 layer on the left Unna boot on the right at his request. He tells me he is using his compression pumps once a day 12/08/17; he has no open area on the right leg is edema control is good here. On the left leg however he has marked erythema and tenderness breakdown of skin. He has  what appears to be a wrap injury just distal to the popliteal fossa. 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 There was swelling erythema and breakdown in the right leg. He has much worse skin breakdown in the left leg as well multiple open areas medially and posteriorly as well as laterally. He tells me he has been using his compression pumps but tells me he feels that the drainage out of his leg is worse when he uses a compression pumps. T be fair to him he is been saying this o for a  while however I don't know that I have really been listening to this. I wonder if the compression pumps are working properly 12/22/17;. Once again he arrives with severe erythema, weeping edema from the left greater than right leg. Noncompliance with compression pumps. New this visit he is complaining of pain on the lateral aspect of the right leg and the medial aspect of his right thigh. He apparently saw his cardiologist Dr. [name] who was ordered an echocardiogram area and I think this is a step in the right direction 12/25/17; started his doxycycline Monday night. There is still intense erythema of the right leg especially in the anterior thigh although there is less tenderness. The erythema around the wound on the right lateral calf also is less tender. He still complaining of pain in the left heel. His wounds are about the same right lateral left medial left lateral. Superficial but certainly not close to closure. He denies being systemically unwell no fever chills no abdominal pain no diarrhea 12/29/17; back in follow-up of his extensive right calf and right thigh cellulitis. I added amoxicillin to cover possible doxycycline resistant  strep. This seems to of done the trick he is in much less pain there is much less erythema and swelling. He has his echocardiogram at 11:00 this morning. X-ray of the left heel was also negative. 01/05/18; the patient arrived with his edema under much better control. Now that he is retired he is able to use his compression pumps daily and sometimes twice a day per the patient. He has a wound on the right leg the lateral wound looks better. Area on the left leg also looks a lot better. He has no evidence of cellulitis in his bilateral thighs I had a quick peak at his echocardiogram. He is in normal ejection fraction and normal left ventricular function. He has moderate pulmonary hypertension moderately reduced right ventricular function. One would have to wonder about chronic sleep apnea although he says he doesn't snore. He'll review the echocardiogram with his cardiologist. 01/12/18; the patient arrives with the edema in both legs under exemplary control. He is using his compression pumps daily and sometimes twice daily. His wound on the right lateral leg is just about closed. He still has some weeping areas on the posterior left calf and lateral left calf although everything is just about closed here as well. I have spoken with Aldean Baker who is the patient's nurse practitioner and infectious disease. She was concerned that the patient had not understood that the parenteral penicillin injections he was receiving for cellulitis prophylaxis was actually benefiting him. I don't think the patient actually saw that I would tend to agree we were certainly dealing with less infections although he had a serious one last month. 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 He is healed. He'll be placed in juxtalite compression wraps and increase his lymphedema pumps to twice daily. We will follow up again next week to ensure there are no issues with the new  regiment. 01/20/18-he is here for evaluation of bilateral lower extremity weeping edema. Yesterday he was placed in compression wrap to the right lower extremity and compression stocking to left lower shrubbery. He states he uses lymphedema pumps last night and again this morning and noted a blister to the left lower extremity. On exam he was noted to have drainage to the right lower extremity. He will be placed in Unna boots bilaterally and follow-up next week 01/26/18; patient was  actually discharged a week ago to his own juxta light stockings only to return the next day with bilateral lower extremity weeping edema.he was placed in bilateral Unna boots. He arrives today with pain in the back of his left leg. There is no open area on the right leg however there is a linear/wrap injury on the left leg and weeping edema on the left leg posteriorly. I spoke with infectious disease about 10 days ago. They were disappointed that the patient elected to discontinue prophylactic intramuscular penicillin shots as they felt it was particularly beneficial in reducing the frequency of his cellulitis. I discussed this with the patient today. He does not share this view. He'll definitely need antibiotics today. Finally he is traveling to [place] and trauma leaving this Saturday and returning a week later and he does not travel with his pumps. He is going by car 01/30/18; patient was seen 4 days ago and brought back in today for review of cellulitis in the left leg posteriorly. I put him on amoxicillin this really hasn't helped as much as I might like. He is also worried because he is traveling to [facility] trauma by car. Finally we will be rewrapping him. There is no open area on the right leg over his left leg has multiple weeping areas as usual 02/09/18; The same wrap on for 10 days. He did not pick up the last doxycycline I prescribed for him. He apparently took 4 days worth he already had. There is nothing open on  his right leg and the edema control is really quite good. He's had damage in the left leg medially and laterally especially probably related to the prolonged use of Unna boots 02/12/18; the patient arrived in clinic today for a nurse visit/wrap change. He complained of a lot of pain in the left posterior calf. He is taking doxycycline that I previously prescribed for him. Unfortunately even though he used his stockings and apparently used to compression pumps twice a day he has weeping edema coming out of the lateral part of his right leg. This is coming from the lower anterior lateral skin area. 02/16/18; the patient has finished his doxycycline and will finish the amoxicillin 2 days. The area of cellulitis in the left calf posteriorly has resolved. He is no longer having any pain. He tells me he is using his compression pumps at least once a day sometimes twice. 02/23/18; the patient finished his doxycycline and Amoxil last week. On Friday he noticed a small erythematous circle about the size of a quarter on the left lower leg just above his ankle. This rapidly expanded and he now has erythema on the lateral and posterior part of the thigh. This is bright red. Also has an area on the dorsal foot just above his toes and a tender area just below the left popliteal fossa. He came off his prophylactic penicillin injections at his own insistence one or 2 months ago. This is obviously deteriorated since then 03/02/18; patient is on doxycycline and Amoxil. Culture I did last week of the weeping area on the back of his left calf grew group B strep. I have therefore renewed the amoxicillin 500 3 times a day for a further week. He has not been systemically unwell. Still complaining of an area of discomfort right under his left popliteal fossa. There is no open wound on the right leg. He tells me that he is using his pumps twice a day on most days 03/09/18; patient arrives in  clinic today completing his amoxicillin  today. The cellulitis on his left leg is better. Furthermore he tells me that he had intramuscular penicillin shots that his primary care office today. However he also states that the wrap on his right leg fell down shortly after leaving clinic last week. He developed a large blister that was present when he came in for a nurse visit later in the week and then he developed intense discomfort around this area.He tells me he is using his compression pumps 03/16/18; the patient has completed his doxycycline. The infectious part of this/cellulitis in the left heel area left popliteal area is a lot better. He has 2 open areas on the right calf. Still areas on the left calf but this is a lot better as well. 03/24/18; the patient arrives complaining of pain in the left popliteal area again. He thinks some of this is wrap injury. He has no open area on the right leg and really no open area on the left calf either except for the popliteal area. He claims to be compliant with the compression pumps 03/31/18; I gave him doxycycline last week because of cellulitis in the left popliteal area. This is a lot better although the surface epithelium is denuded off and response to this. He arrives today with uncontrolled edema in the right calf area as well as a fingernail injury in the right lateral calf. There is only a few open areas on the left 04/06/18; I gave him amoxicillin doxycycline over the last 2 weeks that the amoxicillin should be completing currently. He is not complaining of any pain or systemic symptoms. The only open areas see has is on the right lateral lower leg paradoxically I cannot see anything on the left lower leg. He tells me he is using his compression pumps twice a day on most days. Silver alginate to the wounds that are open under 4 layer compression 04/13/18; he completed antibiotics and has no new complaints. Using his compression pumps. Silver alginate that anything that's opened 04/20/18; he is  using his compression pumps religiously. Silver alginate 4 layer compression anything that's opened. He comes in today with no open wounds on the left leg but 3 on the right including a new one posteriorly. He has 2 on the right lateral and one on the right posterior. He likes Unna boots on the right leg for reasons that aren't really clear we had the usual 4 layer compression on the left. It may be necessary to move to the 4 layer compression on the right however for now I left them in the Unna boots 04/27/18; he is using his compression pumps at least once a day. He has still the wounds on the right lateral calf. The area right posteriorly has closed. He does not have an open wound on the left under 4 layer compression however on the dorsal left foot just proximal to the toes and the left third toe 2 small open areas were identified 05/11/18; he has not uses compression pumps. The areas on the right lateral calf have coalesced into one large wound necrotic surface. On the left side he has one small wound anteriorly however the edema is now weeping out of a large part of his left leg. He says he wasn't using his pumps because of the weeping fluid. I explained to him that this is the time he needs to pump more 05/18/18; patient states he is using his compression pumps twice a day. The area on  the right lateral large wound albeit superficial. On the left side he has innumerable number of small new wounds on the left calf particularly laterally but several anteriorly and medially. All these appear to have healthy granulated base these look like the remnants of blisters however they occurred under compression. The patient arrives in clinic today with his legs somewhat better. There is certainly less edema, less multiple open areas on the left calf and the right anterior leg looks somewhat better as well superficial and a little smaller. However he relates pain and erythema over the last 3-4 days in the thigh  and I looked at this today. He has not been systemically unwell no fever no chills no change in blood sugar values 05/25/18; comes in today in a better state. The severe cellulitis on his left leg seems better with the Keflex. Not as tender. He has not been systemically unwell Hard to find an open wound on the left lower leg using his compression pumps twice a day The confluent wounds on his right lateral calf somewhat better looking. These will ultimately need debridement I didn't do this today. 06/01/18; the severe cellulitis on the left anterior thigh has resolved and he is completed his Keflex. There is no open wound on the left leg however there is a superficial excoriation at the base of the third toe dorsally. Skin on the bottom of his left foot is macerated looking. The left the wounds on the lateral right leg actually looks some better although he did require debridement of the top half of this wound area with an open curet 06/09/18 on evaluation today patient appears to be doing poorly in regard to his right lower extremity in particular this appears to likely be infected he has very thick purulent discharge along with a bright green tent to the discharge. This makes me concerned about the possibility of pseudomonas. He's also having increased discomfort at this point on evaluation. Fortunately there does not appear to be any evidence of infection spreading to the other location at this time. 06/16/18 on evaluation today patient appears to actually be doing fairly well. His ulcer has actually diminished in size quite significantly at this point which is good news. Nonetheless he still does have some evidence of infection he did see infectious disease this morning before coming here for his appointment. I did review the results of their evaluation and their note today. They did actually have him discontinue the Cipro and initiate treatment with linezolid at this time. He is doing this for the  next seven days and they recommended a follow-up in four months with them. He is the keep a log of the need for intermittent antibiotic therapy between now and when he falls back up with infectious disease. This will help them gaze what exactly they need to do to try and help them out. 06/23/18; the patient arrives today with no open wounds on the left leg and left third toe healed. He is been using his compression pumps twice a day. On the right lateral leg he still has a sizable wound but this is a lot better than last time I saw this. 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 Has been using silver alginate under 4 layer compression 06/30/18; the only open wound he has is on the right lateral leg and this looks healthy. No debridement is required. We have  been using silver alginate. He does not have an open wound on the left leg. There is apparently some drainage from the dorsal proximal third toe on the left although I see no open wound here. 07/03/18 on evaluation today patient was actually here just for a nurse visit rapid change. However when he was here on Wednesday for his rat change due to having been healed on the left and then developing blisters we initiated the wrap again knowing that he would be back today for [place] to reevaluate and see were at. Unfortunately he has developed some cellulitis into the proximal portion of his right lower extremity even into the region of his thigh. 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 He was placed on one as what at that point. Nonetheless he is done with that and has been tolerating it well otherwise. Doxycycline which in the past really did not seem to be effective for him. Nonetheless I think the best option may be for [place] to definitely reinitiate the antibiotics for a longer period of time. 07/07/18; since I  last saw this patient a week ago he has had a difficult time. At that point he did not have an open wound on his left leg. We transitioned him into juxta light stockings. He was apparently in the clinic the next day with blisters on the left lateral and left medial lower calf. He also had weeping edema fluid. He was put back into a compression wrap. He was also in the clinic on Friday with intense erythema in his right thigh. Per the patient he was started on Bactrim however that didn't work at all in terms of relieving his pain and swelling. He has taken 3 doxycycline that he had left over from last time and that seems to of helped. He has blistering on the right thigh as well. 07/14/18; the erythema on his right thigh has gotten better with doxycycline that he is finishing. 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 Presumably this cellulitis in the thigh was not related to that although I think this is a bit concerning going forward. He still has an area on the right lateral calf the blister on the right medial calf just below the knee that was discussed above. On the left 2 small open areas left medial and left lateral. Edema control is adequate. He is using his compression pumps twice a day 07/20/18; continued improvement in the condition of both legs especially the edema in his bilateral thighs. He tells me he is been losing weight through a combination of diet and exercise. He is using his compression pumps twice a day. So overall she made to the remaining wounds 07/27/2018; continued improvement in condition of both legs. His edema is well controlled. The area on the right lateral leg is just about closed he had one blisters show up on the medial left upper calf. We have him in 4 layer compression. He is going on a 10-day trip to [place], [place] and [place]. He will be driving. He wants to wear Unna boots because of  the lessening amount of constriction. He will not use compression pumps while he is away 08/05/18 on evaluation today patient actually appears to be doing decently well all things considered in regard to his bilateral lower extremities. The worst ulcer is actually only posterior aspect of  his left lower extremity with a four layer compression wrap cut into his leg a couple weeks back. He did have a trip and actually had [employer] for the trip that he is worn since he was last here. Nonetheless he feels like the [employer] actually do better for him his swelling is up a little bit but he also with his trip was not taking his Lasix on a regular set schedule like he was supposed to be. He states that obviously the reason being that he cannot drive and keep going without having to urinate too frequently which makes it difficult. He did not have his pumps with him while he was away either which I think also maybe playing a role here too. 08/13/2018; the patient only has a small open wound on the right lateral calf which is a big improvement in the last month or 2. He also has the area posteriorly just below the posterior fossa on the left which I think was a wrap injury from several weeks ago. He has no current evidence of cellulitis. He tells me he is back into his compression pumps twice a day. He also tells me that while he was at the laundromat somebody stole a section of his extremitease stockings 08/20/2018; back in the clinic with a much improved state. He only has small areas on the right lateral mid calf which is just about healed. This was is more substantial area for quite a prolonged period of time. He has a small open area on the left anterior tibia. The area on the posterior calf just below the popliteal fossa is closed today. He is using his compression pumps twice a day 08/28/2018; patient has no open wound on the right leg. He has a smattering of open areas on the calf with some  weeping lymphedema. More problematically than that it looks as though his wraps of slipped down in his usual he has very angry upper area of edema just below the right medial knee and on the right lateral calf. He has no open area on his feet. The patient is traveling to [facility] next week. I will send him in an antibiotic. We will continue to wrap the right leg. We ordered extremitease stockings for him last week and I plan to transition the right leg to a stocking when he gets home which will be in 10 days time. As usual he is very reluctant to take his pumps with him when he travels 09/07/2018; patient returns from [facility]. He shows me a picture of his left leg in the mid part of his trip last week with intense fire engine erythema. The picture look bad enough I would have considered sending him to the hospital. Instead he went to the wound care center in [facility]. They did not prescribe him antibiotics but he did take some doxycycline he had leftover from a previous visit. I had given him trimethoprim sulfamethoxazole before he left this did not work according to the patient. This is resulted in some improvement fortunately. He comes back with a large wound on the left posterior calf. Smaller area on the left anterior tibia. Denuded blisters on the dorsal left foot over his toes. Does not have much in the way of wounds on the right leg although he does have a very tender area on the right posterior area just below the popliteal fossa also suggestive of infection. He promises me he is back on his  pumps twice a day 09/15/2018; the intense cellulitis in his left lower calf is a lot better. The wound area on the posterior left calf is also so better. However he has reasonably extensive wounds on the dorsal aspect of his second and third toes and the proximal foot just at the base of the toes. There is nothing open on the right leg 09/22/2018; the patient has excellent edema  control in his legs bilaterally. He is using his external compression pumps twice a day. He has no open area on the right leg and only the areas in the left foot dorsally second and third toe area on the left side. He does not have any signs of active cellulitis. 10/06/2018; the patient has good edema control bilaterally. He has no open wound on the right leg. There is a blister in the posterior aspect of his left calf that we had to deal with today. He is using his compression pumps twice a day. There is no signs of active cellulitis. We have been using silver alginate to the wound areas. He still has vulnerable areas on the base of his left first second toes dorsally He has a his extremities stockings and we are going to transition him today into the stocking on the right leg. He is cautioned that he will need to continue to use the compression pumps twice a day. If he notices uncontrolled edema in the right leg he may need to go to 3 times a day. 10/13/2018; the patient came in for a nurse check on Friday he has a large flaccid blister on the right medial calf just below the knee. We unroofed this. He has this and a new area underneath the posterior mid calf which was undoubtedly a blister as well. He also has several small areas on the right which is the area we put his extremities stocking on. 10/19/2018; the patient went to see infectious disease this morning I am not sure if that was a routine follow-up in any case the doxycycline I had given him was discontinued and started on linezolid. He has not started this. It is easy to look at his left calf and the inflammation and think this is cellulitis however he is very tender in the tissue just below the popliteal fossa and I have no doubt that there is infection going on here. He states the problem he is having is that with the compression pumps the edema goes down and then starts walking the wrap falls down. We will see if we can adhere this. He  has 1 or 2 minuscule open areas on the right still areas that are weeping on the posterior left calf, the base of his left second and third toes 10/26/18; back today in clinic with quite of skin breakdown in his left anterior leg. This may have been infection the area below the popliteal fossa seems a lot better however tremendous epithelial loss on the left anterior mid tibia area over quite inexpensive tissue. He has 2 blisters on the right side but no other open wound here. 10/29/2018; came in urgently to see [place] today and we worked him in for review. He states that the 4 layer compression on the right leg caused pain he had to cut it down to roughly his mid calf this caused swelling above the wrap and he has blisters and skin breakdown today. As a result of the pain he has not been using his pumps. Both legs are a lot more  edematous and there is a lot of weeping fluid. 11/02/18; arrives in clinic with continued difficulties in the right leg> left. Leg is swollen and painful. multiple skin blisters and new open areas especially laterally. He has not been using his pumps on the right leg. 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 He is not systemically unwell. 11/09/2018; the patient claims he is being compliant with his pumps. He is finished the doxycycline I gave him last week. Culture I did of the wound on the right lateral leg showed a few very resistant methicillin staph aureus. This was resistant to doxycycline. Nevertheless he states the pain in the leg is a lot better which makes me wonder if the cultured organism was not really what was causing the problem nevertheless this is a very dangerous organism to be culturing out of any wound. His right leg is still a lot larger than the left. He is using an Radio broadcast assistant on this area, he blames a 4-layer compression for causing the original skin breakdown which I doubt is true however I cannot talk him out  of it. We have been using silver alginate to all of these areas which were initially blisters 11/16/2018; patient is being compliant with his external compression pumps at twice a day. Miraculously he arrives in clinic today with absolutely no open wounds. He has better edema control on the left where he has been using 4 layer compression versus wound of wounds on the right and I pointed this out to him. There is no inflammation in the skin in his lower legs which is also somewhat unusual for him. There is no open wounds on the dorsal left foot. He has extremitease stockings at home and I have asked him to bring these in next week. 11/25/18 patient's lower extremity on examination today on the left appears for the most part to be wound free. He does have an open wound on the lateral aspect of the right lower extremity but this is minimal compared to what I've seen in past. He does request that we go ahead and wrap the left leg as well even though there's nothing open just so hopefully it will not reopen in short order. 1/28; patient has superficial open wounds on the right lateral calf left anterior calf and left posterior calf. His edema control is adequate. 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 We have been using silver alginate as the primary dressing. He claims compliance with his compression pumps 2/4; patient has superficial open wounds on numerous areas of his left calf and again one on the left dorsal foot. The areas on the right lateral calf have healed. The cellulitis that I gave him doxycycline for last week is also resolved this was mostly on the left anterior calf just below the tibial tuberosity. His edema looks fairly well-controlled. He tells me he went to see his primary doctor today and had blood work ordered 2/11; once again he has several open areas on the left calf left tibial area. Most of  these are small and appear to have healthy granulation. He does not have anything open on the right. The edema and control in his thighs is pretty good which is usually a good indication he has been using his pumps as requested. 2/18; he continues to have several small areas on the left calf and left tibial  area. Most of these are small healthy granulation. We put him in his stocking on the right leg last week and he arrives with a superficial open area over the right upper tibia and a fairly large area on the right lateral tibia in similar condition. His edema control actually does not look too bad, he claims to be using his compression pumps twice a day 2/25. Continued small areas on the left calf and left tibial area. New areas especially on the right are identified just below the tibial tuberosity and on the right upper tibia itself. There are also areas of weeping edema fluid even without an obvious wound. He does not have a considerable degree of lymphedema but clearly there is more edema here than his skin can handle. He states he is using the pumps twice a day. We have an Unna boot on the right and 4 layer compression on the left. 3/3; he continues to have an area on the right lateral calf and right posterior calf just below the popliteal fossa. There is a fair amount of tenderness around the wound on the popliteal fossa but I did not see any evidence of cellulitis, could just be that the wrap came down and rubbed in this area. He does not have an open area on the left leg however there is an area on the left dorsal foot at the base of the third toe We have been using silver alginate to all wound areas 3/10; he did not have an open area on his left leg last time he was here a week ago. T oday he arrives with a horizontal wound just below the tibial tuberosity and an area on the left lateral calf. He has intense erythema and tenderness in this area. The area is on the right lateral calf and  right posterior calf better than last week. We have been using silver alginate as usual 3/18 - Patient returns with 3 small open areas on left calf, and 1 small open area on right calf, the skin looks ok with no significant erythema, he continues the UNA boot on right and 4 layer compression on left. The right lateral calf wound is closed , the right posterior is small area. we will continue silver alginate to the areas. 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 He is been tolerating the dressing changes without complication. Fortunately she has made excellent improvement in regard to the overall status of his wounds. Unfortunately every time we cease wrapping him he ends up reopening in causing more significant issues at that point. Again I'm unsure of the best direction to take although I think the lymphedema clinic may be appropriate for him. 02/03/19 on evaluation today patient appears to be doing well in regard to the wounds that we saw him for last week unfortunately he has a new area on the proximal portion of his right medial/posterior lower extremity where the wrap somewhat slowed down and caused swelling and a blister to rub and open. Unfortunately this is the only opening that he has on either leg at this point. 02/17/19 on evaluation today patient's bilateral lower extremities appear to be doing well. He still completely healed in regard to the left lower extremity. In regard to the right lower extremity the area where the wrap and slid down  and caused the blister still seems to be slightly open although this is dramatically better than during the last evaluation two weeks ago. I'm very pleased with the way this stands overall. 03/03/19 on evaluation today patient appears to be doing well in regard  to his right lower extremity in general although he did have a new blister open this does not appear to be showing any evidence of active infection at this time. Fortunately there's No fevers, chills, nausea, or vomiting noted at this time. Overall I feel like he is making good progress it does feel like that the right leg will we perform the [employer] seems to do with a bit better than three layer wrap on the left which slid down on him. We may switch to doing bilateral in the book wraps. 5/4; I have not seen Mr. Goodenow in quite some time. According to our case manager he did not have an open wound on his left leg last week. He had 1 remaining wound on the right posterior medial calf. He arrives today with multiple openings on the left leg probably were blisters and/or wrap injuries from Unna boots. I do not think the Unna boot's will provide adequate compression on the left. I am also not clear about the frequency he is using the compression pumps. 03/17/19 on evaluation today patient appears to be doing excellent in regard to his lower extremities compared to last week's evaluation apparently. He had gotten significantly worse last week which is unfortunate. The [employer] wrap on the left did not seem to do very well for him at all and in fact it didn't control his swelling significantly enough he had an additional outbreak. Subsequently we go back to the four layer compression wrap on the left. This is good news. At least in that he is doing better and the wound seem to be killing him. He still has not heard anything from the lymphedema clinic. 03/24/19 on evaluation today patient actually appears to be doing much better in regard to his bilateral lower Trinity as compared to last week when I saw him. Fortunately there's no signs of active infection at this time. He has been tolerating the dressing changes without complication. Overall I'm extremely pleased with the progress and appearance in  general. 04/07/19 on evaluation today patient appears to be doing well in regard to his bilateral lower extremities. His swelling is significantly down from where it was previous. With that being said he does have a couple blisters still open at this point but fortunately nothing that seems to be too severe and again the majority of the larger openings has healed at this time. 04/14/19 on evaluation today patient actually appears to be doing quite well in regard to his bilateral lower extremities in fact I'm not even sure there's anything significantly open at this time at any site. Nonetheless he did have some trouble with these wraps where they are somewhat irritating him secondary to the fact that he has noted that the graph wasn't too close down to the end of this foot in a little bit short as well up to his knee. Otherwise things seem to be doing quite well. 04/21/19 upon evaluation today patient's wound bed actually showed evidence of being completely healed in regard to both lower extremities which is excellent news. There does not appear to be any signs of active infection which is also good news. I'm very pleased in this regard. No fevers, chills, nausea, or vomiting noted  at this time. 04/28/19 on evaluation today patient appears to be doing a little bit worse in regard to both lower extremities on the left mainly due to the fact that when he went infection disease the wrap was not wrapped quite high enough he developed a blister above this. On the right he is a small open area of nothing too significant but again this is continuing to give him some trouble he has been were in the Velcro compression that he has at home. 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 He's been tolerating the dressing changes without complication. Fortunately there's no signs of active infection at this time. No fevers, chills, nausea, or vomiting noted at  this time. We have been trying to get an appointment with her lymphedema clinic in [facility] but unfortunately nobody can get them on phone with not been able to even fax information over the patient likewise is not been able to get in touch with them. Overall I'm not sure exactly what's going on here with to reach out again today. 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 Still having a lot of drainage unfortunately. He tells me especially in the left but even on the right. There's no signs of active infection which is good news we've been using so ratcheted up to this point. 05/19/19 on evaluation today patient actually appears to be doing quite well with regard to his left lower extremity which is great news. Fortunately in regard to the right lower extremity has an issues with his wrap and he subsequently did remove this from what I'm understanding. Nonetheless long story short is what he had rewrapped once he removed it subsequently had maggots underneath this wrap whenever he came in for evaluation today. With that being said they were obviously completely cleaned away by the nursing staff. The visit today which is excellent news. However he does appear to potentially have some infection around the right ankle region where the maggots were located as well. He will likely require anabiotic therapy today. 05/26/19 on evaluation today patient actually appears to be doing much better in regard to his bilateral lower extremities. I feel like the infection is under much better control. With that being said there were maggots noted when the wrap was removed yet again today. Again this could have potentially been left over from previous although at this time there does not appear to be any signs of significant drainage there was obviously on the wrap some drainage as well this contracted gnats or  otherwise. Either way I do not see anything that appears to be doing worse in my pinion and in fact I think his drainage has slowed down quite significantly likely mainly due to the fact to his infection being under better control. 06/02/2019 on evaluation today patient actually appears to be doing well with regard to his bilateral lower extremities there is no signs of active infection at this time which is great news. With that being said he does have several open areas more so on the right than the left but nonetheless these are all significantly better than previously noted. 06/09/2019 on evaluation today patient actually appears to be doing well. His wrap stayed up and he did not cause any problems he had more drainage on the right compared to the left but overall I do not see  any major issues at this time which is great news. 06/16/2019 on evaluation today patient appears to be doing excellent with regard to his lower extremities the only area that is open is a new blister that can have opened as of today on the medial ankle on the left. Other than this he really seems to be doing great I see no major issues at this point. 06/23/2019 on evaluation today patient appears to be doing quite well with regard to his bilateral lower extremities. In fact he actually appears to be almost completely healed there is a small area of weeping noted of the right lower extremity just above the ankle. Nonetheless fortunately there is no signs of active infection at this time which is good news. No fevers, chills, nausea, vomiting, or diarrhea. 8/24; the patient arrived for a nurse visit today but complained of very significant pain in the left leg and therefore I was asked to look at this. Noted that he did not have an open area on the left leg last week nevertheless this was wrapped. The patient states that he is not been able to put his compression pumps on the left leg because of the discomfort. He has not been  systemically unwell 06/30/2019 on evaluation today patient unfortunately despite being excellent last week is doing much worse with regard to his left lower extremity today. In fact he had to come in for a nurse on Monday where his left leg had to be rewrapped due to excessive weeping Dr. Leanord Hawking placed him on doxycycline at that point. Fortunately there is no signs of active infection Systemically at this time which is good news. 07/07/2019 in regard to the patient's wounds today he actually seems to be doing well with his right lower extremity there really is nothing open or draining at this point this is great news. Unfortunately the left lower extremity is given him additional trouble at this time. There does not appear to be any signs of active infection nonetheless he does have a lot of edema and swelling noted at this point as well as blistering all of which has led to a much more poor appearing leg at this time compared to where it was 2 weeks ago when it was almost completely healed. Obviously this is a little discouraging for the patient. He is try to contact the lymphedema clinic in [place] he has not been able to get through to them. 07/14/2019 on evaluation today patient actually appears to be doing slightly better with regard to his left lower extremity ulcers. Overall I do feel like at least at the top of the wrap that we have been placing this area has healed quite nicely and looks much better. The remainder of the leg is showing signs of improvement. Unfortunately in the thigh area he still has an open region on the left and again on the right he has been utilizing just a Band-Aid on an area that also opened on the thigh. Again this is an area that were not able to wrap although we did do an Ace wrap to provide some compression that something that obviously is a little less effective than the compression wraps we have been using on the lower portion of the leg. He does have an  appointment with the lymphedema clinic in [facility] on Friday. 07/21/2019 on evaluation today patient appears to be doing better with regard to his lower extremity ulcers. He has been tolerating the dressing changes without complication. Fortunately there is  no signs of active infection at this time. No fevers, chills, nausea, vomiting, or diarrhea. I did receive the paperwork from the physical therapist at the lymphedema clinic in [place]. Subsequently I signed off on that this morning and sent that back to him for further progression with the treatment plan. 07/28/2019 on evaluation today patient appears to be doing very well with regard to his right lower extremity where I do not see any open wounds at this point. Fortunately he is feeling great as far as that is concerned as well. In regard to the left lower extremity he has been having issues with still several areas of weeping and edema although the upper leg is doing better his lower leg still I think is going require the compression wrap at this time. No fevers, chills, nausea, vomiting, or diarrhea. 08/04/2019 on evaluation today patient unfortunately is having new wounds on the right lower extremity. Again we have been using Unna boot wrap on that side. We switched him to using his juxta lite wrap at home. With that being said he tells me he has been using it although his legs extremely swollen and to be honest really does not appear that he has been. I cannot know that for sure however. Nonetheless he has multiple new wounds on the right lower extremity at this time. Obviously we will have to see about getting this rewrapped for him today. 08/11/2019 on evaluation today patient appears to be doing fairly well with regard to his wounds. He has been tolerating the dressing changes including the compression wraps without complication. He still has a lot of edema in his upper thigh regions bilaterally he is supposed to be seeing the  lymphedema clinic on the 15th of this month once his wraps arrive for the upper part of his legs. 08/18/2019 on evaluation today patient appears to be doing well with regard to his bilateral lower extremities at this point. He has been tolerating the dressing changes without complication. Fortunately there is no signs of active infection which is also good news. He does have a couple weeping areas on the first and second toe of the right foot he also has just a small area on the left foot upper leg and a small area on the left lower leg but overall he is doing quite well in my opinion. He is supposed to be getting his wraps shortly in fact tomorrow and then subsequently is seeing the lymphedema clinic next Wednesday on the 21st. Of note he is also leaving on the 25th to go on vacation for a week to the beach. For that reason and since there is some uncertainty about what there can be doing at lymphedema clinic next Wednesday I am get a make an appointment for next Friday here for [place] to see what we need to do for him prior to him leaving for vacation. 10/23; patient arrives in considerable pain predominantly in the upper posterior calf just distal to the popliteal fossa also in the wound anteriorly above the major wound. This is probably cellulitis and he has had this recurrently in the past. He has no open wound on the right side and he has had an Radio broadcast assistant in that area. Finally I note that he has an area on the left posterior calf which by enlarge is mostly epithelialized. This protrudes beyond the borders of the surrounding skin in the setting of dry scaly skin and lymphedema. The patient is leaving for [place] on Sunday. Per  his longstanding pattern, he will not take his compression pumps with him predominantly out of fear that they will be stolen. He therefore asked that we put a Unna boot back on the right leg. He will also contact the wound care center in [facility] to see if they can  change his dressing in the mid week. 11/3; patient returned from his vacation to [facility]. He was seen on 1 occasion at their wound care center. They did a 2 layer compression system as they did not have our 4-layer wrap. I am not completely certain what they put on the wounds. They did not change the Unna boot on the right. The patient is also seeing a lymphedema specialist physical therapist in [place]. It appears that he has some compression sleeve for his thighs which indeed look quite a bit better than I am used to seeing. He pumps over these with his external compression pumps. 11/10; the patient has a new wound on the right medial thigh otherwise there is no open areas on the right. He has an area on the left leg posteriorly anteriorly and medially and an area over the left second toe. We have been using silver alginate. He thinks the injury on his thigh is secondary to friction from the compression sleeve he has. 11/17; the patient has a new wound on the right medial thigh last week. He thinks this is because he did not have a underlying stocking for his thigh juxta lite apparatus. He now has this. The area is fairly large and somewhat angry but I do not think he has underlying cellulitis. He has a intact blister on the right anterior tibial area. Small wound on the right great toe dorsally Small area on the medial left calf. 11/30; the patient does not have any open areas on his right leg and we did not take his juxta lite stocking off. However he states that on Friday his compression wrap fell down lodging around his upper mid calf area. As usual this creates a lot of problems for him. He called urgently today to be seen for a nurse visit however the nurse visit turned into a provider visit because of extreme erythema and pain in the left anterior tibia extending laterally and posteriorly. The area that is problematic is extensive 10/06/2019 upon evaluation today patient actually  appears to be doing poorly in regard to his left lower extremity. He Dr. Leanord Hawking did place him on doxycycline this past Monday apparently due to the fact that he was doing much worse in regard to this left leg. Fortunately the doxycycline does seem to be helping. Unfortunately we are still having a very difficult time getting his edema under any type of control in order to anticipate discharge at some point. The only way were really able to control his lymphedema really is with compression wraps and that has only even seemingly temporary. He has been seeing a lymphedema clinic they are trying to help in this regard but still this has been somewhat frustrating in general for the patient. 10/13/19 on evaluation today patient appears to be doing excellent with regard to his right lower extremity as far as the wounds are concerned. His swelling is still quite extensive unfortunately. He is still having a lot of drainage from the thigh areas bilaterally which is unfortunate. He's been going to lymphedema clinic but again he still really does not have this edema under control as far as his lower extremities are concern. With regard  to his left lower extremity this seems to be improving and I do believe the doxycycline has been of benefit for him. He is about to complete the doxycycline. 10/20/2019 on evaluation today patient appears to be doing poorly in regard to his bilateral lower extremities. More in the right thigh he has a lot of irritation at this site unfortunately. In regard to the left lower extremity the wrap was not quite as high it appears and does seem to have caused him some trouble as well. Fortunately there is no evidence of systemic infection though he does have some blue-green drainage which has me concerned for the possibility of Pseudomonas. He tells me he is previously taking Cipro without complications and he really does not care for Levaquin however due to some of the side effects he  has. He is not allergic to any medications specifically antibiotics that were aware of. 10/27/2019 on evaluation today patient actually does appear to be for the most part doing better when compared to last week's evaluation. With that being said he still has multiple open wounds over the bilateral lower extremities. He actually forgot to start taking the Cipro and states that he still has the whole bottle. He does have several new blisters on left lower extremity today I think I would recommend he go ahead and take the Cipro based on what I am seeing at this point. 12/30-Patient comes at 1 week visit, 4 layer compression wraps on the left and Unna boot on the right, primary dressing Xtrasorb and silver alginate. Patient is taking his Cipro and has a few more days left probably 5-6, and the legs are doing better. He states he is using his compressions devices which I believe he has 11/10/2019 on evaluation today patient actually appears to be much better than last time I saw him 2 weeks ago. His wounds are significantly improved and overall I am very pleased in this regard. Fortunately there is no signs of active infection at this time. He is just a couple of days away from completing Cipro. Overall his edema is much better he has been using his lymphedema pumps which I think is also helping at this point. 11/17/2019 on evaluation today patient appears to be doing excellent in regard to his wounds in general. His legs are swollen but not nearly as much as they have been in the past. Fortunately he is tolerating the compression wraps without complication. No fevers, chills, nausea, vomiting, or diarrhea. He does have some erythema however in the distal portion of his right lower extremity specifically around the forefoot and toes there is a little bit of warmth here as well. 11/24/2019 on evaluation today patient appears to be doing well with regard to his right lower extremity I really do not see any open  wounds at this point. His left lower extremity does have several open areas and his right medial thigh also is open. Other than this however overall the patient seems to be making good progress and I am very pleased at this point. 12/01/2019 on evaluation today patient appears to be doing poorly at this point in regard to his left lower extremity has several new blisters despite the fact that we have him in compression wraps. In fact he had a 4-layer compression wrap, his upper thigh wrapped from lymphedema clinic, and a juxta light over top of the 4 layer compression wrap the lymphedema clinic applied and despite all this he still develop blisters underneath. Obviously this does  have me concerned about the fact that unfortunately despite what we are doing to try to get wounds healed he continues to have new areas arise I do not think he is ever good to be at the point where he can realistically just use wraps at home to keep things under control. Typically when we heal him it takes about 1-2 days before he is back in the clinic with severe breakdown and blistering of his lower extremities bilaterally. This is happened numerous times in the past. Unfortunately I think that we may need some help as far as overall fluid overload to kind of limit what we are seeing and get things under better control. 12/08/2019 on evaluation today patient presents for follow-up concerning his ongoing bilateral lower extremity edema. Unfortunately he is still having quite a bit of swelling the compression wraps are controlling this to some degree but he did see Dr. Debara Pickett his cardiologist I do have that available for review today as far as the appointment was concerned that was on 12/06/2019. Obviously that she has been 2 days ago. The patient states that he is only been taking the Lasix 80 mg 1 time a day he had told me previously he was taking this twice a day. Nonetheless Dr. Debara Pickett recommended this be up to 80 mg 2 times a  day for the patient as he did appear to be fluid overloaded. With that being said the patient states he did this yesterday and he was unable to go anywhere or do anything due to the fact that he was constantly having to urinate. Nonetheless I think that this is still good to be something that is important for him as far as trying to get his edema under control at all things that he is going to be able to just expect his wounds to get under control and things to be better without going through at least a period of time where he is trying to stabilize his fluid management in general and I think increasing the Lasix is likely the first step here. It was also mentioned the possibility that the patient may require metolazone. With that being said he wanted to have the patient take Lasix twice a day first and then reevaluating 2 months to see where things stand. 12/15/2019 upon evaluation today patient appears to be doing regard to his legs although his toes are showing some signs of weeping especially on the left at this point to some degree on the right. There does not appear to be any signs of active infection and overall I do feel like the compression wraps are doing well for him but he has not been able to take the Lasix at home and the increased dose that Dr. Debara Pickett recommended. He tells me that just not go to be feasible for him. Nonetheless I think in this case he should probably send a message to Dr. Debara Pickett in order to discuss options from the standpoint of possible admission to get the fluid off or otherwise going forward. 12/22/2019 upon evaluation today patient appears to be doing fairly well with regard to his lower extremities at this point. In fact he would be doing excellent if it was not for the fact that his right anterior thigh apparently had an allergic reaction to adhesive tape that he used. The wound itself that we have been monitoring actually appears to be healed. There is a lot of  irritation at this point. 12/29/2019 upon evaluation today patient appears to  be doing well in regard to his lower extremities. His left medial thigh is open and somewhat draining today but this is the only region that is open the right has done much better with the treatment utilizing the steroid cream that I prescribed for him last week. Overall I am pleased in that regard. Fortunately there is no signs of active infection at this time. No fevers, chills, nausea, vomiting, or diarrhea. 01/05/2020 upon evaluation today patient appears to be doing more poorly in regard to his right lower extremity at this point upon evaluation today. Unfortunately he continues to have issues in this regard and I think the biggest issue is controlling his edema. This obviously is not very well controlled at this point is been recommended that he use the Lasix twice a day but he has not been able to do that. Unfortunately I think this is leading to an issue where honestly he is not really able to effectively control his edema and therefore the wounds really are not doing significantly better. I do not think that he is going to be able to keep things under good control unless he is able to control his edema much better. I discussed this again in great detail with him today. 01/12/2020 good news is patient actually appears to be doing quite well today at this point. He does have an appointment with lymphedema clinic tomorrow. His legs appear healed and the toe on the left is almost completely healed. In general I am very pleased with how things stand at this point. 01/19/2020 upon evaluation today patient appears to actually be doing well in regard to his lower extremities there is nothing open at this point. Fortunately he has done extremely well more recently. Has been seeing lymphedema clinic as well. With that being said he has Velcro wraps for his lower legs as well as his upper legs. The only wound really is on his toe  which is the right great toe and this is barely anything even there. With all that being said I think it is good to be appropriate today to go ahead and switch him over to the Velcro compression wraps. 01/26/2020 upon evaluation today patient appears to be doing worse with regard to his lower extremities after last week switch him to Velcro compression wraps. Unfortunately he lasted less than 24 hours he did not have the sock portion of his Velcro wrap on the left leg and subsequently developed a blister underneath the Velcro portion. Obviously this is not good and not what we were looking for at this point. He states the lymphedema clinic did tell him to wear the wrap for 23 hours and take him off for 1 I am okay with that plan but again right now we got a get things back under control again he may have some cellulitis noted as well. 02/02/2020 upon evaluation today patient unfortunately appears to have several areas of blistering on his bilateral lower extremities today mainly on the feet. His legs do seem to be doing somewhat better which is good news. Fortunately there is no evidence of active infection at this time. No fevers, chills, nausea, vomiting, or diarrhea. 02/16/2020 upon evaluation today patient appears to be doing well at this time with regard to his legs. He has a couple weeping areas on his toes but for the most part everything is doing better and does appear to be sealed up on his legs which is excellent news. We can continue with wrapping him  at this point as he had every time we discontinue the wraps he just breaks out with new wounds. There is really no point in is going forward with this at this point. 03/08/2020 upon evaluation today patient actually appears to be doing quite well with regard to his lower extremity ulcers. He has just a very superficial and really almost nonexistent blister on the left lower extremity he has in general done very well with the compression wraps. With  that being said I do not see any signs of infection at this time which is good news. Electronic Signature(s) Signed: 03/08/2020 6:03:48 PM By: Lenda Kelp PA-C Entered By: Lenda Kelp on 03/08/2020 18:03:48 -------------------------------------------------------------------------------- Physical Exam Details Patient Name: Date of Service: [name], Keola J. 03/08/2020 10:30 A M Medical Record Number: [id number] Patient Account Number: [id number] Date of Birth/Sex: Treating RN: 03/19/1951 (69 y.o. [name] Primary Care Provider: Marney Setting, [name] Other Clinician: Referring Provider: Treating Provider/Extender: Lenda [facility] [name], [name] in Treatment: 215 Constitutional Obese and well-hydrated in no acute distress. Respiratory normal breathing without difficulty. Psychiatric this patient is able to make decisions and demonstrates good insight into disease process. Alert and Oriented x 3. pleasant and cooperative. Notes With regard to the patient's legs again I feel like he is doing quite well there just a very small area on the left the remainder of his lower extremities appear to be doing quite well his toes have also sealed up and are doing well. The only thing at this point is my concern over the fact that if we discontinue his wraps he is likely to just reopen with blisters as that is what always happens. I am recommending at this point we probably wrap him for a couple weeks once he has completely healed in order to let things toughen up and then see if he can transition to his Velcro compression wraps at that point. Electronic Signature(s) Signed: 03/08/2020 6:04:20 PM By: Lenda Kelp PA-C Entered By: Lenda Kelp on 03/08/2020 18:04:20 -------------------------------------------------------------------------------- Physician Orders Details Patient Name: Date of Service: [name], Guss J. 03/08/2020 10:30 A M Medical Record Number: [id number] Patient  Account Number: [id number] Date of Birth/Sex: Treating RN: [birth year] (69 y.o. 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 Dressing Change Frequency Do not change entire dressing for one week. - both legs Skin Barriers/Peri-Wound Care Moisturizing lotion - to legs Primary Wound Dressing Wound #167 Left,Posterior Knee Calcium Alginate with Silver Secondary Dressing Wound #167 Left,Posterior Knee Dry Gauze ABD pad Edema Control 4 layer compression: Left lower extremity Unna Boot to Right Lower Extremity Avoid standing for long periods of time Elevate legs to the level of the heart or above for 30 minutes daily and/or when sitting, a frequency of: - whenever sitting Exercise regularly Segmental Compressive Device. - lymphadema pumps 60 minutes 2 times per day Other: - If feet or  wraps become wet, remove wrap and apply juxtalites Additional Orders / Instructions Other: - take lasix as prescribed by cardiologist Electronic Signature(s) Signed: 03/08/2020 5:03:53 PM By: Zenaida Deed RN, BSN Signed: 03/08/2020 6:28:26 PM By: Lenda Kelp PA-C Entered By: Zenaida Deed on 03/08/2020  11:36:08 -------------------------------------------------------------------------------- Problem List Details Patient Name: Date of Service: [name], Vinicio J. 03/08/2020 10:30 A M Medical Record Number: [id number] Patient Account Number: [id number] Date of Birth/Sex: Treating RN: [birth year] (69 y.o. [name] Primary Care Provider: Marney Setting, [name] Other Clinician: Referring Provider: Treating Provider/Extender: Suzy Bouchard, [name] in Treatment: 215 Active Problems ICD-10 Encounter Code Description Active Date MDM Diagnosis 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 skin 06/30/2018 No Yes L97.221 Non-pressure chronic ulcer of left calf limited to breakdown of skin 09/30/2016 No Yes 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 of 01/22/2016 No Yes bilateral lower extremity I89.0 Lymphedema, not elsewhere classified 01/22/2016 No Yes E11.622 Type 2 diabetes mellitus with other skin ulcer 01/22/2016 No Yes 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, unspecified 01/22/2016 No Yes L03.116 Cellulitis of left lower limb 04/01/2017 No Yes Inactive Problems ICD-10 Code Description Active Date Inactive Date 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 skin 06/30/2017 06/30/2017 L97.521 Non-pressure chronic ulcer of other 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 of skin 04/27/2018 04/27/2018 L03.115 Cellulitis of right lower limb 12/22/2017 12/22/2017 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 06/30/2018 06/30/2018 L97.511 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 of skin 06/30/2018 06/30/2018 Resolved Problems Electronic Signature(s) Signed: 03/08/2020 11:29:36 AM By: Lenda Kelp PA-C Entered By: Lenda Kelp on 03/08/2020  11:29:34 -------------------------------------------------------------------------------- Progress Note Details Patient Name: Date of Service: [name], Pauline J. 03/08/2020 10:30 A M Medical Record Number: [id number] Patient Account Number: [id number] Date of Birth/Sex: Treating RN: 1951/04/12 (69 y.o. [name] Primary Care Provider: Marney Setting, [name] Other Clinician: Referring Provider: Treating Provider/Extender: Lenda Kelp MCGO [name], [name] in Treatment: 215 Subjective Chief Complaint Information obtained from Patient patient is here for evaluation venous/lymphedema weeping History of Present Illness (HPI) Referred by PCP for consultation. Patient has long standing history of BLE venous stasis, no prior ulcerations. At beginning of month, developed cellulitis and weeping. Received IM Rocephin followed by Keflex and resolved. Wears compression stocking, appr 6 months old. Not sure strength. No present drainage. 01/22/16 this is a patient who is a type II diabetic on insulin. 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 He tells me he religiously wears pressure stockings of uncertain strength. He was here with weeping edema about 8 months ago but did not have an open wound. Roughly a month ago he had a reopening on his bilateral legs. He is been using bandages and Neosporin. He does not complain of pain. 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 Last BUN/creatinine I have is from 11/20/15 at 13 and 1.0 respectively 01/29/16; patient arrives today having tolerated the Profore wrap. He brought in his stockings and these are 18 mmHg stockings he bought from [employer]. The compression here is likely inadequate. He does not complain of pain or excessive drainage she has no systemic symptoms. The wound on the right looks improved as does the one  on the left  although one on the left is more substantial with still tissue at risk below the actual wound area on the bilateral posterior calf 02/05/16; patient arrives with poor edema control. He states that we did put a 4 layer compression on it last week. No weight appear 5 this. 02/12/16; the area on the posterior right Has healed. The left Has a substantial wound that has necrotic surface eschar that requires a debridement with a curette. 02/16/16;the patient called or a Nurse visit secondary to increased swelling. He had been in earlier in the week with his right leg healed. He was transitioned to is on pressure stocking on the right leg with the only open wound on the left, a substantial area on the left posterior calf. Note he has a history of severe lower extremity edema, he has a history of chronic atrial fibrillation but not heart failure per my notes but I'll need to research this. He is not complaining of chest pain shortness of breath or orthopnea. The intake nurse noted blisters on the previously closed right leg 02/19/16; this is the patient's regular visit day. I see him on Friday with escalating edema new wounds on the right leg and clear signs of at least right ventricular heart failure. I increased his Lasix to 40 twice a day. He is returning currently in follow-up. States he is noticed a decrease in that the edema 02/26/16 patient's legs have much less edema. There is nothing really open on the right leg. The left leg has improved condition of the large superficial wound on the posterior left leg 03/04/16; edema control is very much better. The patient's right leg wounds have healed. On the left leg he continues to have severe venous inflammation on the posterior aspect of the left leg. There is no tenderness and I don't think any of this is cellulitis. 03/11/16; patient's right leg is married healed and he is in his own stocking. The patient's left leg has deteriorated somewhat. There is a  lot of erythema around the wound on the posterior left leg. There is also a significant rim of erythema posteriorly just above where the wrap would've ended there is a new wound in this location and a lot of tenderness. Can't rule out cellulitis in this area. 03/15/16; patient's right leg remains healed and he is in his own stocking. The patient's left leg is much better than last review. His major wound on the posterior aspect of his left Is almost fully epithelialized. He has 3 small injuries from the wraps. Really. Erythema seems a lot better on antibiotics 03/18/16; right leg remains healed and he is in his own stocking. The patient's left leg is much better. The area on the posterior aspect of the left calf is fully epithelialized. His 3 small injuries which were wrap injuries on the left are improved only one seems still open his erythema has resolved 03/25/16; patient's right leg remains healed and he is in his own stocking. There is no open area today on the left leg posterior leg is completely closed up. His wrap injuries at the superior aspect of his leg are also resolved. He looks as though he has some irritation on the dorsal ankle but this is fully epithelialized without evidence of infection. 03/28/16; we discharged this patient on Monday. Transitioned him into his own stocking. There were problems almost immediately with uncontrolled swelling weeping edema multiple some of which have opened. He does not feel systemically unwell in particular no chest pain no  shortness of breath and he does not feel 04/08/16; the edema is under better control with the Profore light wrap but he still has pitting edema. There is one large wound anteriorly 2 on the medial aspect of his left leg and 3 small areas on the superior posterior calf. Drainage is not excessive he is tolerating a Profore light well 04/15/16; put a Profore wrap on him last week. This is controlled is edema however he had a lot of pain on  his left anterior foot most of his wounds are healed 04/22/16 once again the patient has denuded areas on the left anterior foot which he states are because his wrap slips up word. He saw his primary physician today is on Lasix 40 twice a day and states that he his weight is down 20 pounds over the last 3 months. 04/29/16: Much improved. left anterior foot much improved. He is now on Lasix 80 mg per day. Much improved edema control 05/06/16; I was hoping to be able to discharge him today however once again he has blisters at a low level of where the compression was placed last week mostly on his left lateral but also his left medial leg and a small area on the anterior part of the left foot. 05/09/16; apparently the patient went home after his appointment on 7/4 later in the evening developing pain in his upper medial thigh together with subjective fever and chills although his temperature was not taken. The pain was so intense he felt he would probably have to call 911. However he then remembered that he had leftover doxycycline from a previous round of antibiotics and took these. By the next morning he felt a lot better. He called and spoke to one of our nurses and I approved doxycycline over the phone thinking that this was in relation to the wounds we had previously seen although they were definitely were not. The patient feels a lot better old fever no chills he is still working. Blood sugars are reasonably controlled 05/13/16; patient is back in for review of his cellulitis on his anterior medial upper thigh. He is taking doxycycline this is a lot better. Culture I did of the nodular area on the dorsal aspect of his foot grew MRSA this also looks a lot better. 05/20/16; the patient is cellulitis on the medial upper thigh has resolved. All of his wound areas including the left anterior foot, areas on the medial aspect of the left calf and the lateral aspect of the calf at all resolved. He has a new  blister on the left dorsal foot at the level of the fourth toe this was excised. No evidence of infection 05/27/16; patient continues to complain weeping edema. He has new blisterlike wounds on the left anterior lateral and posterior lateral calf at the top of his wrap levels. The area on his left anterior foot appears better. He is not complaining of fever, pain or pruritus in his feet. 05/30/16; the patient's blisters on his left anterior leg posterior calf all look improved. He did not increase the Lasix 100 mg as I suggested because he was going to run out of his 40 mg tablets. He is still having weeping edema of his toes 06/03/16; I renewed his Lasix at 80 mg once a day as he was about to run out when I last saw him. He is on 80 mg of Lasix now. I have asked him to cut down on the excessive amount of water he  was drinking and asked him to drink according to his thirst mechanisms 06/12/2016 -- was seen 2 days ago and was supposed to wear his compression stockings at home but he is developed lymphedema and superficial blisters on the left lower extremity and hence came in for a review 06/24/16; the remaining wound is on his left anterior leg. He still has edema coming from between his toes. There is lymphedema here however his edema is generally better than when I last saw this. He has a history of atrial fibrillation but does not have a known history of congestive heart failure nevertheless I think he probably has this at least on a diastolic basis. 07/01/16 I reviewed his echocardiogram from January 2017. This was essentially normal. He did not have LVH, EF of 55-60%. His right ventricular function was normal although he did have trivial tricuspid and pulmonic regurgitation. This is not audible on exam however. I increased his Lasix to do massive edema in his legs well above his knees I think in early July. He was also drinking an excessive amount of water at the time. 07/15/16; missed his appointment  last week because of the Labor Day holiday on Monday. He could not get another appointment later in the week. Started to feel the wrap digging in superiorly so we remove the top half and the bottom half of his wrap. He has extensive erythema and blistering superiorly in the left leg. Very tender. Very swollen. Edema in his foot with leaking edema fluid. He has not been systemically unwell 07/22/16; the area on the left leg laterally required some debridement. The medial wounds look more stable. His wrap injury wounds appear to have healed. Edema and his foot is better, weeping edema is also better. He tells me he is meeting with the supplier of the external compression pumps at work 08/05/16; the patient was on vacation last week in [facility]. His wrap is been on for an extended period of time. Also over the weekend he developed an extensive area of tender erythema across his anterior medial thigh. He took to doxycycline yesterday that he had leftover from a previous prescription. The patient complains of weeping edema coming out of his toes 08/08/16; I saw this patient on 10/2. He was tender across his anterior thigh. I put him on doxycycline. He returns today in follow-up. He does not have any open wounds on his lower leg, he still has edema weeping into his toes. 08/12/16; patient was seen back urgently today to follow-up for his extensive left thigh cellulitis/erysipelas. He comes back with a lot less swelling and erythema pain is much better. I believe I gave him Augmentin and Cipro. His wrap was cut down as he stated a roll down his legs. He developed blistering above the level of the wrap that remained. He has 2 open blisters and 1 intact. 08/19/16; patient is been doing his primary doctor who is increased his Lasix from 40-80 once a day or 80 already has less edema. Cellulitis has remained improved in the left thigh. 2 open areas on the posterior left calf 08/26/16; he returns today having new  open blisters on the anterior part of his left leg. He has his compression pumps but is not yet been shown how to use some vital representative from the supplier. 09/02/16 patient returns today with no open wounds on the left leg. Some maceration in his plantar toes 09/10/2016 -- Dr. Leanord Hawking had recently discharged him on 09/02/2016 and he has come  right back with redness swelling and some open ulcers on his left lower extremity. He says this was caused by trying to apply his compression stockings and he's been unable to use this and has not been able to use his lymphedema pumps. He had some doxycycline leftover and he has started on this a few days ago. 09/16/16; there are no open wounds on his leg on the left and no evidence of cellulitis. 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 He does not complain of symptoms here. I am not clear use using his external compression pumps. 09/23/16; I have not seen this patient in 2 weeks. He canceled his appointment 10 days ago as he was going on vacation. He tells me that on Monday he noticed a large area on his posterior left leg which is been draining copiously and is reopened into a large wound. He is been using ABDs and the external part of his juxtalite, according to our nurse this was not on properly. 10/07/16; Still a substantial area on the posterior left leg. Using silver alginate 10/14/16; in general better although there is still open area which looks healthy. Still using silver alginate. He reminds me that this happen before he left for [facility]. T oday while he was showering in the morning. He had been using his juxtalite's 10/21/16; the area on his posterior left leg is fully epithelialized. However he arrives today with a large area of tender erythema in his medial and posterior left thigh just above the knee. I have marked the area. Once again he is reluctant to consider hospitalization.  I treated him with oral antibiotics in the past for a similar situation with resolution I think with doxycycline however this area it seems more extensive to me. He is not complaining of fever but does have chills and says states he is thirsty. His blood sugar today was in the 140s at home 10/25/16 the area on his posterior left leg is fully epithelialized although there is still some weeping edema. The large area of tenderness and erythema in his medial and posterior left thigh is a lot less tender although there is still a lot of swelling in this thigh. He states he feels a lot better. He is on doxycycline and Augmentin that I started last week. This will continued until Tuesday, December 26. I have ordered a duplex ultrasound of the left thigh rule out DVT whether there is an abscess something that would need to be drained I would also like to know. 11/01/16; he still has weeping edema from a not fully epithelialized area on his left posterior calf. Most of the rest of this looks a lot better. He has completed his antibiotics. His thigh is a lot better. Duplex ultrasound did not show a DVT in the thigh 11/08/16; he comes in today with more Denuded surface epithelium from the posterior aspect of his calf. There is no real evidence of cellulitis. The superior aspect of his wrap appears to have put quite an indentation in his leg just below the knee and this may have contributed. He does not complain of pain or fever. We have been using silver alginate as the primary dressing. The area of cellulitis in the right thigh has totally resolved. He has been using his compression stockings once a week 11/15/16; the patient arrives today with more loss of epithelium from the posterior aspect of his left calf. He now has a  fairly substantial wound in this area. The reason behind this deterioration isn't exactly clear although his edema is not well controlled. He states he feels he is generally more swollen  systemically. He is not complaining of chest pain shortness of breath fever. T me he has an appointment with his primary physician in early February. He is on 80 mg of oral ells Lasix a day. He claims compliance with the external compression pumps. He is not having any pain in his legs similar to what he has with his recurrent cellulitis 11/22/16; the patient arrives a follow-up of his large area on his left lateral calf. This looks somewhat better today. He came in earlier in the week for a dressing change since I saw him a week ago. He is not complaining of any pain no shortness of breath no chest pain 11/28/16; the patient arrives for follow-up of his large area on the left lateral calf this does not look better. In fact it is larger weeping edema. The surface of the wound does not look too bad. We have been using silver alginate although I'm not certain that this is a dressing issue. 12/05/16; again the patient follows up for a large wound on the left lateral and left posterior calf this does not look better. There continues to be weeping edema necrotic surface tissue. More worrisome than this once again there is erythema below the wound involving the distal Achilles and heel suggestive of cellulitis. He is on his feet working most of the day of this is not going well. We are changing his dressing twice a week to facilitate the drainage. 12/12/16; not much change in the overall dimensions of the large area on the left posterior calf. This is very inflamed looking. I gave him an. Doxycycline last week does not really seem to have helped. He found the wrap very painful indeed it seems to of dog into his legs superiorly and perhaps around the heel. He came in early today because the drainage had soaked through his dressings. 12/19/16- patient arrives for follow-up evaluation of his left lower extremity ulcers. He states that he is using his lymphedema pumps once daily when there is "no drainage". He admits  to not using his lipedema pumps while under current treatment. His blood sugars have been consistently between 150-200. 12/26/16; the patient is not using his compression pumps at home because of the wetness on his feet. I've advised him that I think it's important for him to use this daily. He finds his feet too wet, he can put a plastic bag over his legs while he is in the pumps. Otherwise I think will be in a vicious circle. We are using silver alginate to the major area on his left posterior calf 01/02/17; the patient's posterior left leg has further of all into 3 open wounds. All of them covered with a necrotic surface. He claims to be using his compression pumps once a day. His edema control is marginal. Continue with silver alginate 01/10/17; the patient's left posterior leg actually looks somewhat better. There is less edema, less erythema. Still has 3 open areas covered with a necrotic surface requiring debridement. He claims to be using his compression pumps once a day his edema control is better 01/17/17; the patient's left posterior calf look better last week when I saw him and his wrap was changed 2 days ago. He has noted increasing pain in the left heel and arrives today with much larger wounds extensive erythema  extending down into the entire heel area especially tender medially. He is not systemically unwell CBGs have been controlled no fever. Our intake nurse showed me limegreen drainage on his AVD pads. 01/24/17; his usual this patient responds nicely to antibiotics last week giving him Levaquin for presumed Pseudomonas. The whole entire posterior part of his leg is much better much less inflamed and in the case of his Achilles heel area much less tender. He has also had some epithelialization posteriorly there are still open areas here and still draining but overall considerably better 01/31/17- He has continue to tolerate the compression wraps. he states that he continues to use the  lymphedema pumps daily, and can increase to twice daily on the weekends. He is voicing no complaints or concerns regarding his LLE ulcers 02/07/17-he is here for follow-up evaluation. He states that he noted some erythema to the left medial and anterior thigh, which he states is new as of yesterday. He is concerned about recurrent cellulitis. He states his blood sugars have been slightly elevated, this morning in the 180s 02/14/17; he is here for follow-up evaluation. When he was last here there was erythema superiorly from his posterior wound in his anterior thigh. He was prescribed Levaquin however a culture of the wound surface grew MRSA over the phone I changed him to doxycycline on Monday and things seem to be a lot better. 02/24/17; patient missed his appointment on Friday therefore we changed his nurse visit into a physician visit today. Still using silver alginate on the large area of the posterior left thigh. He isn't new area on the dorsal left second toe 03/03/17; actually better today although he admits he has not used his external compression pumps in the last 2 days or so because of work responsibilities over the weekend. 03/10/17; continued improvement. External compression pumps once a day almost all of his wounds have closed on the posterior left calf. Better edema control 03/17/17; in general improved. He still has 3 small open areas on the lateral aspect of his left leg however most of the area on the posterior part of his leg is epithelialized. He has better edema control. He has an ABD pad under his stocking on the right anterior lower leg although he did not let us look at that today. 03/24/17; patient arrives back in clinic today with no open areas however there are areas on the posterior left calf and anterior left calf that are less than 100% epithelialized. His edema is well controlled in the left lower leg. There is some pitting edema probably lymphedema in the left upper thigh. He  uses compression pumps at home once per day. I tried to get him to do this twice a day although he is very reticent. 04/01/2017 -- for the last 2 days he's had significant redness, tenderness and weeping and came in for an urgent visit today. 04/07/17; patient still has 6 more days of doxycycline. He was seen by Dr. Meyer Russel last Wednesday for cellulitis involving the posterior aspect, lateral aspect of his Involving his heel. For the most part he is better there is less erythema and less weeping. He has been on his feet for 12 hours o2 over the weekend. Using his compression pumps once a day 04/14/17 arrives today with continued improvement. Only one area on the posterior left calf that is not fully epithelialized. He has intense bilateral venous inflammation associated with his chronic venous insufficiency disease and secondary lymphedema. We have been using silver alginate  to the left posterior calf wound In passing he tells [place] today that the right leg but we have not seen in quite some time has an open area on it but he doesn't want [place] to look at this today states he will show this to [place] next week. 04/21/17; there is no open area on his left leg although he still reports some weeping edema. He showed [place] his right leg today which is the first time we've seen this leg in a long time. He has a large area of open wound on the right leg anteriorly healthy granulation. Quite a bit of swelling in the right leg and some degree of venous inflammation. He told us about the right leg in passing last week but states that deterioration in the right leg really only happened over the weekend 04/28/17; there is no open area on the left leg although there is an irritated part on the posterior which is like a wrap injury. The wound on the right leg which was new from last week at least to [place] is a lot better. 05/05/17; still no open area on the left leg. Patient is using his new compression stocking which seems to be doing  a good job of controlling the edema. He states he is using his compression pumps once per day. The right leg still has an open wound although it is better in terms of surface area. Required debridement. A lot of pain in the posterior right Achilles marked tenderness. Usually this type of presentation this patient gives concern for an active cellulitis 05/12/17; patient arrives today with his major wound from last week on the right lateral leg somewhat better. Still requiring debridement. He was using his compression stocking on the left leg however that is reopened with superficial wounds anteriorly he did not have an open wound on this leg previously. He is still using his juxta light's once daily at night. He cannot find the time to do this in the morning as he has to be at work by 7 AM 05/19/17; right lateral leg wound looks improved. No debridement required. The concerning area is on the left posterior leg which appears to almost have a subcutaneous hemorrhagic component to it. We've been using silver alginate to all the wounds 05/26/17; the right lateral leg wound continues to look improved. However the area on the left posterior calf is a tightly adherent surface. [place] using silver alginate. Because of the weeping edema in his legs there is very little good alternatives. 06/02/17; the patient left here last week looking quite good. Major wound on the left posterior calf and a small one on the right lateral calf. Both of these look satisfactory. He tells me that by Wednesday he had noted increased pain in the left leg and drainage. He called on Thursday and Friday to get an appointment here but we were blocked. He did not go to urgent care or his primary physician. He thinks he had a fever on Thursday but did not actually take his temperature. He has not been using his compression pumps on the left leg because of pain. I advised him to go to the emergency room today for IV antibiotics for stents of  left leg cellulitis but he has refused I have asked him to take 2 days off work to keep his leg elevated and he has refused this as well. In view of this I'm going to call him and Augmentin and doxycycline. He tells me he took  some leftover doxycycline starting on Friday previous cultures of the left leg have grown MRSA 06/09/2017 -- the patient has florid cellulitis of his left lower extremity with copious amount of drainage and there is no doubt in my mind that he needs inpatient care. However after a detailed discussion regarding the risk benefits and alternatives he refuses to get admitted to the hospital. With no other recourse I will continue him on oral antibiotics as before and hopefully he'll have his infectious disease consultation this week. 06/16/2017 -- the patient was seen today by the nurse practitioner at infectious disease Ms. Dixon. 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 He has also been advise Lotrimin cream locally. she also had wise IV antibiotics for his condition if it flares up 06/23/17; patient arrives today with drainage bilaterally although the remaining wound on the left posterior calf after cleaning up today "highlighter yellow drainage" did not look too bad. Unfortunately he has had breakdown on the right anterior leg [previously this leg had not been open and he is using a black stocking] he went to see infectious disease and is been put on clindamycin 150 mg daily, I did not verify the dose although I'm not familiar with using clindamycin in this dosing range, perhaps for prophylaxisoo 06/27/17; I brought this patient back today to follow-up on the wound deterioration on the right lower leg together with surrounding cellulitis. I started him on doxycycline 4 days ago. This area looks  better however he comes in today with intense cellulitis on the medial part of his left thigh. This is not have a wound in this area. Extremely tender. We've been using silver alginate to the wounds on the right lower leg left lower leg with bilateral 4 layer compression he is using his external compression pumps once a day 07/04/17; patient's left medial thigh cellulitis looks better. He has not been using his compression pumps as his insert said it was contraindicated with cellulitis. His right leg continues to make improvements all the wounds are still open. We only have one remaining wound on the left posterior calf. Using silver alginate to all open areas. He is on doxycycline which I started a week ago and should be finishing I gave him Augmentin after Thursday's visit for the severe cellulitis on the left medial thigh which fortunately looks better 07/14/17; the patient's left medial thigh cellulitis has resolved. The cellulitis in his right lower calf on the right also looks better. All of his wounds are stable to improved we've been using silver alginate he has completed the antibiotics I have given him. He has clindamycin 150 mg once a day prescribed by infectious disease for prophylaxis, I've advised him to start this now. We have been using bilateral Unna boots over silver alginate to the wound areas 07/21/17; the patient is been to see infectious disease who noted his recurrent problems with cellulitis. He was not able to tolerate prophylactic clindamycin therefore he is on amoxicillin 500 twice a day. He also had a second daily dose of Lasix added By Dr. Oneta Rack but he is not taking this. Nor is he being completely compliant with his compression pumps a especially not this week. He has 2 remaining wounds one on the right posterior lateral lower leg and one on the left posterior medial  lower leg. 07/28/17; maintain on Amoxil 500 twice a day as prophylaxis for recurrent cellulitis as ordered by  infectious disease. The patient has Unna boots bilaterally. 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 He has bilateral Unna boots for compression and silver alginate to his wounds. Arrives today with his legs looking as good as I have seen him in quite some time. Not surprisingly his wounds look better as well with improvement on the right lateral leg venous insufficiency wound and also the left medial leg. He is still using the compression pumps once a day 08/11/17; both legs appear to be doing better wounds on the right lateral and left medial legs look better. Skin on the right leg quite good. He is been using silver alginate as the primary dressing. I'm going to use Anasept gel calcium alginate and maintain all the secondary dressings 08/18/17; the patient continues to actually do quite well. The area on his right lateral leg is just about closed the left medial also looks better although it is still moist in this area. His edema is well controlled we have been using Anasept gel with calcium alginate and the usual secondary dressings, 4 layer compression and once daily use of his compression pumps "always been able to manage 09/01/17; the patient continues to do reasonably well in spite of his trip to [place]. The area on the right lateral leg is epithelialized. Left is much better but still open. He has more edema and more chronic erythema on the left leg [venous inflammation] 09/08/17; he arrives today with no open wound on the right lateral leg and decently controlled edema. Unfortunately his left leg is not nearly as in his good situation as last week.he apparently had increasing edema starting on Saturday. He edema soaked through into his foot so used a plastic bag to walk around his home. The area on the medial right leg which was his open  area is about the same however he has lost surface epithelium on the left lateral which is new and he has significant pain in the Achilles area of the left foot. He is already on amoxicillin chronically for prophylaxis of cellulitis in the left leg 09/15/17; he is completed a week of doxycycline and the cellulitis in the left posterior leg and Achilles area is as usual improved. He still has a lot of edema and fluid soaking through his dressings. There is no open wound on the right leg. He saw infectious disease NP today 09/22/17;As usual 1 we transition him from our compression wraps to his stockings things did not go well. He has several small open areas on the right leg. He states this was caused by the compression wrap on his skin although he did not wear this with the stockings over them. He has several superficial areas on the left leg medially laterally posteriorly. He does not have any evidence of active cellulitis especially involving the left Achilles The patient is traveling from [facility] Saturday going [facility] He states he isn't attempting to get an appointment with a heel objects wound center there to change his dressings. I am not completely certain whether this will work 10/06/17; the patient came in on Friday for a nurse visit and the nurse reported that his legs actually look quite good. He arrives in clinic today for his regular  follow-up visit. He has a new wound on his left third toe over the PIP probably caused by friction with his footwear. He has small areas on the left leg and a very superficial but epithelialized area on the right anterior lateral lower leg. Other than that his legs look as good as I've seen him in quite some time. We have been using silver alginate Review of systems; no chest pain no shortness of breath other than this a 10 point review of systems negative 10/20/17; seen by Dr. Meyer Russel last week. He had taken some antibiotics [doxycycline] that he had  left over. Dr. Meyer Russel thought he had candida infection and declined to give him further antibiotics. He has a small wound remaining on the right lateral leg several areas on the left leg including a larger area on the left posterior several left medial and anterior and a small wound on the left lateral. The area on the left dorsal third toe looks a lot better. ROS; Gen.; no fever, respiratory no cough no sputum Cardiac no chest pain other than this 10 point review of system is negative 10/30/17; patient arrives today having fallen in the bathtub 3 days ago. It took him a while to get up. He has pain and maceration in the wounds on his left leg which have deteriorated. He has not been using his pumps he also has some maceration on the right lateral leg. 11/03/17; patient continues to have weeping edema especially in the left leg. This saturates his dressings which were just put on on 12/27. As usual the doxycycline seems to take care of the cellulitis on his lower leg. He is not complaining of fever, chills, or other systemic symptoms. He states his leg feels a lot better on the doxycycline I gave him empirically. He also apparently gets injections at his primary doctor's officeo Rocephin for cellulitis prophylaxis. I didn't ask him about his compression pump compliance today I think that's probably marginal. Arrives in the clinic with all of his dressings primary and secondary macerated full of fluid and he has bilateral edema 11/10/17; the patient's right leg looks some better although there is still a cluster of wounds on the right lateral. The left leg is inflamed with almost circumferential skin loss medially to laterally although we are still maintaining anteriorly. He does not have overt cellulitis there is a lot of drainage. He is not using compression pumps. We have been using silver alginate to the wound areas, there are not a lot of options here 11/17/17; the patient's right leg continues to be  stable although there is still open wounds, better than last week. The inflammation in the left leg is better. Still loss of surface layer epithelium especially posteriorly. There is no overt cellulitis in the amount of edema and his left leg is really quite good, tells me he is using his compression pumps once a day. 11/24/17; patient's right leg has a small superficial wound laterally this continues to improve. The inflammation in the left leg is still improving however we have continuous surface layer epithelial loss posteriorly. There is no overt cellulitis in the amount of edema in both legs is really quite good. He states he is using his compression pumps on the left leg once a day for 5 out of 7 days 12/01/17; very small superficial areas on the right lateral leg continue to improve. Edema control in both legs is better today. He has continued loss of surface epithelialization and left posterior calf  although I think this is better. We have been using silver alginate with large number of absorptive secondary dressings 4 layer on the left Unna boot on the right at his request. He tells me he is using his compression pumps once a day 12/08/17; he has no open area on the right leg is edema control is good here. ooOn the left leg however he has marked erythema and tenderness breakdown of skin. He has what appears to be a wrap injury just distal to the popliteal fossa. 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 There was swelling erythema and breakdown in the right leg. He has much worse skin breakdown in the left leg as well multiple open areas medially and posteriorly as well as laterally. He tells me he has been using  his compression pumps but tells me he feels that the drainage out of his leg is worse when he uses a compression pumps. T be fair to him he is been saying this o for a while however I don't know that I have really been listening to this. I wonder if the compression pumps are working properly 12/22/17;. Once again he arrives with severe erythema, weeping edema from the left greater than right leg. Noncompliance with compression pumps. New this visit he is complaining of pain on the lateral aspect of the right leg and the medial aspect of his right thigh. He apparently saw his cardiologist Dr. [name] who was ordered an echocardiogram area and I think this is a step in the right direction 12/25/17; started his doxycycline Monday night. There is still intense erythema of the right leg especially in the anterior thigh although there is less tenderness. The erythema around the wound on the right lateral calf also is less tender. He still complaining of pain in the left heel. His wounds are about the same right lateral left medial left lateral. Superficial but certainly not close to closure. He denies being systemically unwell no fever chills no abdominal pain no diarrhea 12/29/17; back in follow-up of his extensive right calf and right thigh cellulitis. I added amoxicillin to cover possible doxycycline resistant strep. This seems to of done the trick he is in much less pain there is much less erythema and swelling. He has his echocardiogram at 11:00 this morning. X-ray of the left heel was also negative. 01/05/18; the patient arrived with his edema under much better control. Now that he is retired he is able to use his compression pumps daily and sometimes twice a day per the patient. He has a wound on the right leg the lateral wound looks better. Area on the left leg also looks a lot better. He has no evidence of cellulitis in his bilateral thighs I had a quick peak at his echocardiogram. He is in normal  ejection fraction and normal left ventricular function. He has moderate pulmonary hypertension moderately reduced right ventricular function. One would have to wonder about chronic sleep apnea although he says he doesn't snore. He'll review the echocardiogram with his cardiologist. 01/12/18; the patient arrives with the edema in both legs under exemplary control. He is using his compression pumps daily and sometimes twice daily. His wound on the right lateral leg is just about  closed. He still has some weeping areas on the posterior left calf and lateral left calf although everything is just about closed here as well. I have spoken with Aldean Baker who is the patient's nurse practitioner and infectious disease. She was concerned that the patient had not understood that the parenteral penicillin injections he was receiving for cellulitis prophylaxis was actually benefiting him. I don't think the patient actually saw that I would tend to agree we were certainly dealing with less infections although he had a serious one last month. 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 He is healed. He'll be placed in juxtalite compression wraps and increase his lymphedema pumps to twice daily. We will follow up again next week to ensure there are no issues with the new regiment. 01/20/18-he is here for evaluation of bilateral lower extremity weeping edema. Yesterday he was placed in compression wrap to the right lower extremity and compression stocking to left lower shrubbery. He states he uses lymphedema pumps last night and again this morning and noted a blister to the left lower extremity. On exam he was noted to have drainage to the right lower extremity. He will be placed in Unna boots bilaterally and follow-up next week 01/26/18; patient was actually discharged a week ago to his own juxta light stockings only to return the next day with bilateral lower extremity weeping  edema.he was placed in bilateral Unna boots. He arrives today with pain in the back of his left leg. There is no open area on the right leg however there is a linear/wrap injury on the left leg and weeping edema on the left leg posteriorly. I spoke with infectious disease about 10 days ago. They were disappointed that the patient elected to discontinue prophylactic intramuscular penicillin shots as they felt it was particularly beneficial in reducing the frequency of his cellulitis. I discussed this with the patient today. He does not share this view. He'll definitely need antibiotics today. Finally he is traveling to [place] and trauma leaving this Saturday and returning a week later and he does not travel with his pumps. He is going by car 01/30/18; patient was seen 4 days ago and brought back in today for review of cellulitis in the left leg posteriorly. I put him on amoxicillin this really hasn't helped as much as I might like. He is also worried because he is traveling to [facility] trauma by car. Finally we will be rewrapping him. There is no open area on the right leg over his left leg has multiple weeping areas as usual 02/09/18; The same wrap on for 10 days. He did not pick up the last doxycycline I prescribed for him. He apparently took 4 days worth he already had. There is nothing open on his right leg and the edema control is really quite good. He's had damage in the left leg medially and laterally especially probably related to the prolonged use of Unna boots 02/12/18; the patient arrived in clinic today for a nurse visit/wrap change. He complained of a lot of pain in the left posterior calf. He is taking doxycycline that I previously prescribed for him. Unfortunately even though he used his stockings and apparently used to compression pumps twice a day he has weeping edema coming out of the lateral part of his right leg. This is coming from the lower anterior lateral skin area. 02/16/18; the  patient has finished his doxycycline and will finish the amoxicillin 2 days. The area  of cellulitis in the left calf posteriorly has resolved. He is no longer having any pain. He tells me he is using his compression pumps at least once a day sometimes twice. 02/23/18; the patient finished his doxycycline and Amoxil last week. On Friday he noticed a small erythematous circle about the size of a quarter on the left lower leg just above his ankle. This rapidly expanded and he now has erythema on the lateral and posterior part of the thigh. This is bright red. Also has an area on the dorsal foot just above his toes and a tender area just below the left popliteal fossa. He came off his prophylactic penicillin injections at his own insistence one or 2 months ago. This is obviously deteriorated since then 03/02/18; patient is on doxycycline and Amoxil. Culture I did last week of the weeping area on the back of his left calf grew group B strep. I have therefore renewed the amoxicillin 500 3 times a day for a further week. He has not been systemically unwell. Still complaining of an area of discomfort right under his left popliteal fossa. There is no open wound on the right leg. He tells me that he is using his pumps twice a day on most days 03/09/18; patient arrives in clinic today completing his amoxicillin today. The cellulitis on his left leg is better. Furthermore he tells me that he had intramuscular penicillin shots that his primary care office today. However he also states that the wrap on his right leg fell down shortly after leaving clinic last week. He developed a large blister that was present when he came in for a nurse visit later in the week and then he developed intense discomfort around this area.He tells me he is using his compression pumps 03/16/18; the patient has completed his doxycycline. The infectious part of this/cellulitis in the left heel area left popliteal area is a lot better. He has 2  open areas on the right calf. Still areas on the left calf but this is a lot better as well. 03/24/18; the patient arrives complaining of pain in the left popliteal area again. He thinks some of this is wrap injury. He has no open area on the right leg and really no open area on the left calf either except for the popliteal area. He claims to be compliant with the compression pumps 03/31/18; I gave him doxycycline last week because of cellulitis in the left popliteal area. This is a lot better although the surface epithelium is denuded off and response to this. He arrives today with uncontrolled edema in the right calf area as well as a fingernail injury in the right lateral calf. There is only a few open areas on the left 04/06/18; I gave him amoxicillin doxycycline over the last 2 weeks that the amoxicillin should be completing currently. He is not complaining of any pain or systemic symptoms. The only open areas see has is on the right lateral lower leg paradoxically I cannot see anything on the left lower leg. He tells me he is using his compression pumps twice a day on most days. Silver alginate to the wounds that are open under 4 layer compression 04/13/18; he completed antibiotics and has no new complaints. Using his compression pumps. Silver alginate that anything that's opened 04/20/18; he is using his compression pumps religiously. Silver alginate 4 layer compression anything that's opened. He comes in today with no open wounds on the left leg but 3 on the right  including a new one posteriorly. He has 2 on the right lateral and one on the right posterior. He likes Unna boots on the right leg for reasons that aren't really clear we had the usual 4 layer compression on the left. It may be necessary to move to the 4 layer compression on the right however for now I left them in the Unna boots 04/27/18; he is using his compression pumps at least once a day. He has still the wounds on the right lateral  calf. The area right posteriorly has closed. He does not have an open wound on the left under 4 layer compression however on the dorsal left foot just proximal to the toes and the left third toe 2 small open areas were identified 05/11/18; he has not uses compression pumps. The areas on the right lateral calf have coalesced into one large wound necrotic surface. On the left side he has one small wound anteriorly however the edema is now weeping out of a large part of his left leg. He says he wasn't using his pumps because of the weeping fluid. I explained to him that this is the time he needs to pump more 05/18/18; patient states he is using his compression pumps twice a day. The area on the right lateral large wound albeit superficial. On the left side he has innumerable number of small new wounds on the left calf particularly laterally but several anteriorly and medially. All these appear to have healthy granulated base these look like the remnants of blisters however they occurred under compression. The patient arrives in clinic today with his legs somewhat better. There is certainly less edema, less multiple open areas on the left calf and the right anterior leg looks somewhat better as well superficial and a little smaller. However he relates pain and erythema over the last 3-4 days in the thigh and I looked at this today. He has not been systemically unwell no fever no chills no change in blood sugar values 05/25/18; comes in today in a better state. The severe cellulitis on his left leg seems better with the Keflex. Not as tender. He has not been systemically unwell ooHard to find an open wound on the left lower leg using his compression pumps twice a day ooThe confluent wounds on his right lateral calf somewhat better looking. These will ultimately need debridement I didn't do this today. 06/01/18; the severe cellulitis on the left anterior thigh has resolved and he is completed his  Keflex. ooThere is no open wound on the left leg however there is a superficial excoriation at the base of the third toe dorsally. Skin on the bottom of his left foot is macerated looking. ooThe left the wounds on the lateral right leg actually looks some better although he did require debridement of the top half of this wound area with an open curet 06/09/18 on evaluation today patient appears to be doing poorly in regard to his right lower extremity in particular this appears to likely be infected he has very thick purulent discharge along with a bright green tent to the discharge. This makes me concerned about the possibility of pseudomonas. He's also having increased discomfort at this point on evaluation. Fortunately there does not appear to be any evidence of infection spreading to the other location at this time. 06/16/18 on evaluation today patient appears to actually be doing fairly well. His ulcer has actually diminished in size quite significantly at this point which is good news.  Nonetheless he still does have some evidence of infection he did see infectious disease this morning before coming here for his appointment. I did review the results of their evaluation and their note today. They did actually have him discontinue the Cipro and initiate treatment with linezolid at this time. He is doing this for the next seven days and they recommended a follow-up in four months with them. He is the keep a log of the need for intermittent antibiotic therapy between now and when he falls back up with infectious disease. This will help them gaze what exactly they need to do to try and help them out. 06/23/18; the patient arrives today with no open wounds on the left leg and left third toe healed. He is been using his compression pumps twice a day. On the right lateral leg he still has a sizable wound but this is a lot better than last time I saw this. In my absence he apparently cultured MRSA coming  from this wound and is completed a course of linezolid as has been directed by infectious disease. Has been using silver alginate under 4 layer compression 06/30/18; the only open wound he has is on the right lateral leg and this looks healthy. No debridement is required. We have been using silver alginate. He does not have an open wound on the left leg. There is apparently some drainage from the dorsal proximal third toe on the left although I see no open wound here. 07/03/18 on evaluation today patient was actually here just for a nurse visit rapid change. However when he was here on Wednesday for his rat change due to having been healed on the left and then developing blisters we initiated the wrap again knowing that he would be back today for [place] to reevaluate and see were at. Unfortunately he has developed some cellulitis into the proximal portion of his right lower extremity even into the region of his thigh. 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 He was placed on one as what at that point. Nonetheless he is done with that and has been tolerating it well otherwise. Doxycycline which in the past really did not seem to be effective for him. Nonetheless I think the best option may be for [place] to definitely reinitiate the antibiotics for a longer period of time. 07/07/18; since I last saw this patient a week ago he has had a difficult time. At that point he did not have an open wound on his left leg. We transitioned him into juxta light stockings. He was apparently in the clinic the next day with blisters on the left lateral and left medial lower calf. He also had weeping edema fluid. He was put back into a compression wrap. He was also in the clinic on Friday with intense erythema in his right thigh. Per the patient he was started on Bactrim however that didn't work at all in terms of relieving his pain and swelling. He has taken 3 doxycycline that he had left  over from last time and that seems to of helped. He has blistering on the right thigh as well. 07/14/18; the erythema on his right thigh has gotten better with doxycycline that he is finishing. 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 Presumably this cellulitis in the thigh was not related to that although I think this is  a bit concerning going forward. He still has an area on the right lateral calf the blister on the right medial calf just below the knee that was discussed above. On the left 2 small open areas left medial and left lateral. Edema control is adequate. He is using his compression pumps twice a day 07/20/18; continued improvement in the condition of both legs especially the edema in his bilateral thighs. He tells me he is been losing weight through a combination of diet and exercise. He is using his compression pumps twice a day. So overall she made to the remaining wounds 07/27/2018; continued improvement in condition of both legs. His edema is well controlled. The area on the right lateral leg is just about closed he had one blisters show up on the medial left upper calf. We have him in 4 layer compression. He is going on a 10-day trip to [place]. He will be driving. He wants to wear Unna boots because of the lessening amount of constriction. He will not use compression pumps while he is away 08/05/18 on evaluation today patient actually appears to be doing decently well all things considered in regard to his bilateral lower extremities. The worst ulcer is actually only posterior aspect of his left lower extremity with a four layer compression wrap cut into his leg a couple weeks back. He did have a trip and actually had [employer] for the trip that he is worn since he was last here. Nonetheless he feels like the [employer] actually do better for him his swelling is up a  little bit but he also with his trip was not taking his Lasix on a regular set schedule like he was supposed to be. He states that obviously the reason being that he cannot drive and keep going without having to urinate too frequently which makes it difficult. He did not have his pumps with him while he was away either which I think also maybe playing a role here too. 08/13/2018; the patient only has a small open wound on the right lateral calf which is a big improvement in the last month or 2. He also has the area posteriorly just below the posterior fossa on the left which I think was a wrap injury from several weeks ago. He has no current evidence of cellulitis. He tells me he is back into his compression pumps twice a day. He also tells me that while he was at the laundromat somebody stole a section of his extremitease stockings 08/20/2018; back in the clinic with a much improved state. He only has small areas on the right lateral mid calf which is just about healed. This was is more substantial area for quite a prolonged period of time. He has a small open area on the left anterior tibia. The area on the posterior calf just below the popliteal fossa is closed today. He is using his compression pumps twice a day 08/28/2018; patient has no open wound on the right leg. He has a smattering of open areas on the calf with some weeping lymphedema. More problematically than that it looks as though his wraps of slipped down in his usual he has very angry upper area of edema just below the right medial knee and on the right lateral calf. He has no open area on his feet. The patient is traveling to [facility] next week. I will send him in an antibiotic. We will continue to wrap  the right leg. We ordered extremitease stockings for him last week and I plan to transition the right leg to a stocking when he gets home which will be in 10 days time. As usual he is very reluctant to take his pumps with him when  he travels 09/07/2018; patient returns from [facility]. He shows me a picture of his left leg in the mid part of his trip last week with intense fire engine erythema. The picture look bad enough I would have considered sending him to the hospital. Instead he went to the wound care center in [facility]. They did not prescribe him antibiotics but he did take some doxycycline he had leftover from a previous visit. I had given him trimethoprim sulfamethoxazole before he left this did not work according to the patient. This is resulted in some improvement fortunately. He comes back with a large wound on the left posterior calf. Smaller area on the left anterior tibia. Denuded blisters on the dorsal left foot over his toes. Does not have much in the way of wounds on the right leg although he does have a very tender area on the right posterior area just below the popliteal fossa also suggestive of infection. He promises me he is back on his pumps twice a day 09/15/2018; the intense cellulitis in his left lower calf is a lot better. The wound area on the posterior left calf is also so better. However he has reasonably extensive wounds on the dorsal aspect of his second and third toes and the proximal foot just at the base of the toes. There is nothing open on the right leg 09/22/2018; the patient has excellent edema control in his legs bilaterally. He is using his external compression pumps twice a day. He has no open area on the right leg and only the areas in the left foot dorsally second and third toe area on the left side. He does not have any signs of active cellulitis. 10/06/2018; the patient has good edema control bilaterally. He has no open wound on the right leg. There is a blister in the posterior aspect of his left calf that we had to deal with today. He is using his compression pumps twice a day. There is no signs of active cellulitis. We have been using silver alginate to the wound  areas. He still has vulnerable areas on the base of his left first second toes dorsally He has a his extremities stockings and we are going to transition him today into the stocking on the right leg. He is cautioned that he will need to continue to use the compression pumps twice a day. If he notices uncontrolled edema in the right leg he may need to go to 3 times a day. 10/13/2018; the patient came in for a nurse check on Friday he has a large flaccid blister on the right medial calf just below the knee. We unroofed this. He has this and a new area underneath the posterior mid calf which was undoubtedly a blister as well. He also has several small areas on the right which is the area we put his extremities stocking on. 10/19/2018; the patient went to see infectious disease this morning I am not sure if that was a routine follow-up in any case the doxycycline I had given him was discontinued and started on linezolid. He has not started this. It is easy to look at his left calf and the inflammation and think this is cellulitis however  he is very tender in the tissue just below the popliteal fossa and I have no doubt that there is infection going on here. He states the problem he is having is that with the compression pumps the edema goes down and then starts walking the wrap falls down. We will see if we can adhere this. He has 1 or 2 minuscule open areas on the right still areas that are weeping on the posterior left calf, the base of his left second and third toes 10/26/18; back today in clinic with quite of skin breakdown in his left anterior leg. This may have been infection the area below the popliteal fossa seems a lot better however tremendous epithelial loss on the left anterior mid tibia area over quite inexpensive tissue. He has 2 blisters on the right side but no other open wound here. 10/29/2018; came in urgently to see [place] today and we worked him in for review. He states that the 4 layer  compression on the right leg caused pain he had to cut it down to roughly his mid calf this caused swelling above the wrap and he has blisters and skin breakdown today. As a result of the pain he has not been using his pumps. Both legs are a lot more edematous and there is a lot of weeping fluid. 11/02/18; arrives in clinic with continued difficulties in the right leg> left. Leg is swollen and painful. multiple skin blisters and new open areas especially laterally. He has not been using his pumps on the right leg. 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 He is not systemically unwell. 11/09/2018; the patient claims he is being compliant with his pumps. He is finished the doxycycline I gave him last week. Culture I did of the wound on the right lateral leg showed a few very resistant methicillin staph aureus. This was resistant to doxycycline. Nevertheless he states the pain in the leg is a lot better which makes me wonder if the cultured organism was not really what was causing the problem nevertheless this is a very dangerous organism to be culturing out of any wound. His right leg is still a lot larger than the left. He is using an Radio broadcast assistant on this area, he blames a 4-layer compression for causing the original skin breakdown which I doubt is true however I cannot talk him out of it. We have been using silver alginate to all of these areas which were initially blisters 11/16/2018; patient is being compliant with his external compression pumps at twice a day. Miraculously he arrives in clinic today with absolutely no open wounds. He has better edema control on the left where he has been using 4 layer compression versus wound of wounds on the right and I pointed this out to him. There is no inflammation in the skin in his lower legs which is also somewhat unusual for him. There is no open wounds on the dorsal left foot. He has extremitease stockings at home  and I have asked him to bring these in next week. 11/25/18 patient's lower extremity on examination today on the left appears for the most part to be wound free. He does have an open wound on the lateral aspect of the right lower extremity but this is minimal compared to what I've seen in past. He does request that we go ahead and wrap the left leg as well even though there's nothing open just so hopefully it will  not reopen in short order. 1/28; patient has superficial open wounds on the right lateral calf left anterior calf and left posterior calf. His edema control is adequate. 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 We have been using silver alginate as the primary dressing. He claims compliance with his compression pumps 2/4; patient has superficial open wounds on numerous areas of his left calf and again one on the left dorsal foot. The areas on the right lateral calf have healed. The cellulitis that I gave him doxycycline for last week is also resolved this was mostly on the left anterior calf just below the tibial tuberosity. His edema looks fairly well-controlled. He tells me he went to see his primary doctor today and had blood work ordered 2/11; once again he has several open areas on the left calf left tibial area. Most of these are small and appear to have healthy granulation. He does not have anything open on the right. The edema and control in his thighs is pretty good which is usually a good indication he has been using his pumps as requested. 2/18; he continues to have several small areas on the left calf and left tibial area. Most of these are small healthy granulation. We put him in his stocking on the right leg last week and he arrives with a superficial open area over the right upper tibia and a fairly large area on the right lateral tibia in similar condition. His edema control actually does  not look too bad, he claims to be using his compression pumps twice a day 2/25. Continued small areas on the left calf and left tibial area. New areas especially on the right are identified just below the tibial tuberosity and on the right upper tibia itself. There are also areas of weeping edema fluid even without an obvious wound. He does not have a considerable degree of lymphedema but clearly there is more edema here than his skin can handle. He states he is using the pumps twice a day. We have an Unna boot on the right and 4 layer compression on the left. 3/3; he continues to have an area on the right lateral calf and right posterior calf just below the popliteal fossa. There is a fair amount of tenderness around the wound on the popliteal fossa but I did not see any evidence of cellulitis, could just be that the wrap came down and rubbed in this area. ooHe does not have an open area on the left leg however there is an area on the left dorsal foot at the base of the third toe ooWe have been using silver alginate to all wound areas 3/10; he did not have an open area on his left leg last time he was here a week ago. T oday he arrives with a horizontal wound just below the tibial tuberosity and an area on the left lateral calf. He has intense erythema and tenderness in this area. The area is on the right lateral calf and right posterior calf better than last week. We have been using silver alginate as usual 3/18 - Patient returns with 3 small open areas on left calf, and 1 small open area on right calf, the skin looks ok with no significant erythema, he continues the UNA boot on right and 4 layer compression on left. The right lateral calf wound is closed , the right posterior is small area.  we will continue silver alginate to the areas. 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 He is been tolerating the dressing changes without complication. Fortunately she has made excellent improvement in regard to the overall status of his wounds. Unfortunately every time we cease wrapping him he ends up reopening in causing more significant issues at that point. Again I'm unsure of the best direction to take although I think the lymphedema clinic may be appropriate for him. 02/03/19 on evaluation today patient appears to be doing well in regard to the wounds that we saw him for last week unfortunately he has a new area on the proximal portion of his right medial/posterior lower extremity where the wrap somewhat slowed down and caused swelling and a blister to rub and open. Unfortunately this is the only opening that he has on either leg at this point. 02/17/19 on evaluation today patient's bilateral lower extremities appear to be doing well. He still completely healed in regard to the left lower extremity. In regard to the right lower extremity the area where the wrap and slid down and caused the blister still seems to be slightly open although this is dramatically better than during the last evaluation two weeks ago. I'm very pleased with the way this stands overall. 03/03/19 on evaluation today patient appears to be doing well in regard to his right lower extremity in general although he did have a new blister open this does not appear to be showing any evidence of active infection at this time. Fortunately there's No fevers, chills, nausea, or vomiting noted at this time. Overall I feel like he is making good progress it does feel like that the right leg will we perform the [employer] seems to do with a bit better than three layer wrap on the left which slid down on him. We may switch to doing bilateral in the book wraps. 5/4; I have not seen Mr. Viscomi in quite some time. According to our case manager he  did not have an open wound on his left leg last week. He had 1 remaining wound on the right posterior medial calf. He arrives today with multiple openings on the left leg probably were blisters and/or wrap injuries from Unna boots. I do not think the Unna boot's will provide adequate compression on the left. I am also not clear about the frequency he is using the compression pumps. 03/17/19 on evaluation today patient appears to be doing excellent in regard to his lower extremities compared to last week's evaluation apparently. He had gotten significantly worse last week which is unfortunate. The [employer] wrap on the left did not seem to do very well for him at all and in fact it didn't control his swelling significantly enough he had an additional outbreak. Subsequently we go back to the four layer compression wrap on the left. This is good news. At least in that he is doing better and the wound seem to be killing him. He still has not heard anything from the lymphedema clinic. 03/24/19 on evaluation today patient actually appears to be doing much better in regard to his bilateral lower Trinity as compared to last week when I saw him. Fortunately there's no signs of active infection at this time. He has been tolerating the dressing changes without complication.  Overall I'm extremely pleased with the progress and appearance in general. 04/07/19 on evaluation today patient appears to be doing well in regard to his bilateral lower extremities. His swelling is significantly down from where it was previous. With that being said he does have a couple blisters still open at this point but fortunately nothing that seems to be too severe and again the majority of the larger openings has healed at this time. 04/14/19 on evaluation today patient actually appears to be doing quite well in regard to his bilateral lower extremities in fact I'm not even sure there's anything significantly open at this time at any site.  Nonetheless he did have some trouble with these wraps where they are somewhat irritating him secondary to the fact that he has noted that the graph wasn't too close down to the end of this foot in a little bit short as well up to his knee. Otherwise things seem to be doing quite well. 04/21/19 upon evaluation today patient's wound bed actually showed evidence of being completely healed in regard to both lower extremities which is excellent news. There does not appear to be any signs of active infection which is also good news. I'm very pleased in this regard. No fevers, chills, nausea, or vomiting noted at this time. 04/28/19 on evaluation today patient appears to be doing a little bit worse in regard to both lower extremities on the left mainly due to the fact that when he went infection disease the wrap was not wrapped quite high enough he developed a blister above this. On the right he is a small open area of nothing too significant but again this is continuing to give him some trouble he has been were in the Velcro compression that he has at home. 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 He's been tolerating the dressing changes without complication. Fortunately there's no signs of active infection at this time. No fevers, chills, nausea, or vomiting noted at this time. We have been trying to get an appointment with her lymphedema clinic in [facility] but unfortunately nobody can get them on phone with not been able to even fax information over the patient likewise is not been able to get in touch with them. Overall I'm not sure exactly what's going on here with to reach out again today. 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 Still having a lot of drainage unfortunately. He tells me especially in the left but even on the right.  There's no signs of active infection which is good news we've been using so ratcheted up to this point. 05/19/19 on evaluation today patient actually appears to be doing quite well with regard to his left lower extremity which is great news. Fortunately in regard to the right lower extremity has an issues with his wrap and he subsequently did remove this from what I'm understanding. Nonetheless long story short is what he had rewrapped once he removed it subsequently had maggots underneath this wrap whenever he came in for evaluation today. With that being said they were obviously completely cleaned away by the nursing staff. The visit today which is excellent news. However he does appear to potentially have some infection around the right ankle region where the maggots were located as well. He will likely require anabiotic therapy today. 05/26/19 on evaluation today patient actually appears  to be doing much better in regard to his bilateral lower extremities. I feel like the infection is under much better control. With that being said there were maggots noted when the wrap was removed yet again today. Again this could have potentially been left over from previous although at this time there does not appear to be any signs of significant drainage there was obviously on the wrap some drainage as well this contracted gnats or otherwise. Either way I do not see anything that appears to be doing worse in my pinion and in fact I think his drainage has slowed down quite significantly likely mainly due to the fact to his infection being under better control. 06/02/2019 on evaluation today patient actually appears to be doing well with regard to his bilateral lower extremities there is no signs of active infection at this time which is great news. With that being said he does have several open areas more so on the right than the left but nonetheless these are all significantly better than previously  noted. 06/09/2019 on evaluation today patient actually appears to be doing well. His wrap stayed up and he did not cause any problems he had more drainage on the right compared to the left but overall I do not see any major issues at this time which is great news. 06/16/2019 on evaluation today patient appears to be doing excellent with regard to his lower extremities the only area that is open is a new blister that can have opened as of today on the medial ankle on the left. Other than this he really seems to be doing great I see no major issues at this point. 06/23/2019 on evaluation today patient appears to be doing quite well with regard to his bilateral lower extremities. In fact he actually appears to be almost completely healed there is a small area of weeping noted of the right lower extremity just above the ankle. Nonetheless fortunately there is no signs of active infection at this time which is good news. No fevers, chills, nausea, vomiting, or diarrhea. 8/24; the patient arrived for a nurse visit today but complained of very significant pain in the left leg and therefore I was asked to look at this. Noted that he did not have an open area on the left leg last week nevertheless this was wrapped. The patient states that he is not been able to put his compression pumps on the left leg because of the discomfort. He has not been systemically unwell 06/30/2019 on evaluation today patient unfortunately despite being excellent last week is doing much worse with regard to his left lower extremity today. In fact he had to come in for a nurse on Monday where his left leg had to be rewrapped due to excessive weeping Dr. Leanord Hawking placed him on doxycycline at that point. Fortunately there is no signs of active infection Systemically at this time which is good news. 07/07/2019 in regard to the patient's wounds today he actually seems to be doing well with his right lower extremity there really is nothing open or  draining at this point this is great news. Unfortunately the left lower extremity is given him additional trouble at this time. There does not appear to be any signs of active infection nonetheless he does have a lot of edema and swelling noted at this point as well as blistering all of which has led to a much more poor appearing leg at this time compared to where it was 2  weeks ago when it was almost completely healed. Obviously this is a little discouraging for the patient. He is try to contact the lymphedema clinic in [place] he has not been able to get through to them. 07/14/2019 on evaluation today patient actually appears to be doing slightly better with regard to his left lower extremity ulcers. Overall I do feel like at least at the top of the wrap that we have been placing this area has healed quite nicely and looks much better. The remainder of the leg is showing signs of improvement. Unfortunately in the thigh area he still has an open region on the left and again on the right he has been utilizing just a Band-Aid on an area that also opened on the thigh. Again this is an area that were not able to wrap although we did do an Ace wrap to provide some compression that something that obviously is a little less effective than the compression wraps we have been using on the lower portion of the leg. He does have an appointment with the lymphedema clinic in [facility] on Friday. 07/21/2019 on evaluation today patient appears to be doing better with regard to his lower extremity ulcers. He has been tolerating the dressing changes without complication. Fortunately there is no signs of active infection at this time. No fevers, chills, nausea, vomiting, or diarrhea. I did receive the paperwork from the physical therapist at the lymphedema clinic in [place]. Subsequently I signed off on that this morning and sent that back to him for further progression with the treatment plan. 07/28/2019 on  evaluation today patient appears to be doing very well with regard to his right lower extremity where I do not see any open wounds at this point. Fortunately he is feeling great as far as that is concerned as well. In regard to the left lower extremity he has been having issues with still several areas of weeping and edema although the upper leg is doing better his lower leg still I think is going require the compression wrap at this time. No fevers, chills, nausea, vomiting, or diarrhea. 08/04/2019 on evaluation today patient unfortunately is having new wounds on the right lower extremity. Again we have been using Unna boot wrap on that side. We switched him to using his juxta lite wrap at home. With that being said he tells me he has been using it although his legs extremely swollen and to be honest really does not appear that he has been. I cannot know that for sure however. Nonetheless he has multiple new wounds on the right lower extremity at this time. Obviously we will have to see about getting this rewrapped for him today. 08/11/2019 on evaluation today patient appears to be doing fairly well with regard to his wounds. He has been tolerating the dressing changes including the compression wraps without complication. He still has a lot of edema in his upper thigh regions bilaterally he is supposed to be seeing the lymphedema clinic on the 15th of this month once his wraps arrive for the upper part of his legs. 08/18/2019 on evaluation today patient appears to be doing well with regard to his bilateral lower extremities at this point. He has been tolerating the dressing changes without complication. Fortunately there is no signs of active infection which is also good news. He does have a couple weeping areas on the first and second toe of the right foot he also has just a small area on the left foot upper  leg and a small area on the left lower leg but overall he is doing quite well in my opinion. He  is supposed to be getting his wraps shortly in fact tomorrow and then subsequently is seeing the lymphedema clinic next Wednesday on the 21st. Of note he is also leaving on the 25th to go on vacation for a week to the beach. For that reason and since there is some uncertainty about what there can be doing at lymphedema clinic next Wednesday I am get a make an appointment for next Friday here for [place] to see what we need to do for him prior to him leaving for vacation. 10/23; patient arrives in considerable pain predominantly in the upper posterior calf just distal to the popliteal fossa also in the wound anteriorly above the major wound. This is probably cellulitis and he has had this recurrently in the past. He has no open wound on the right side and he has had an Radio broadcast assistant in that area. Finally I note that he has an area on the left posterior calf which by enlarge is mostly epithelialized. This protrudes beyond the borders of the surrounding skin in the setting of dry scaly skin and lymphedema. The patient is leaving for [facility] on Sunday. Per his longstanding pattern, he will not take his compression pumps with him predominantly out of fear that they will be stolen. He therefore asked that we put a Unna boot back on the right leg. He will also contact the wound care center in [facility] to see if they can change his dressing in the mid week. 11/3; patient returned from his vacation to [facility]. He was seen on 1 occasion at their wound care center. They did a 2 layer compression system as they did not have our 4-layer wrap. I am not completely certain what they put on the wounds. They did not change the Unna boot on the right. The patient is also seeing a lymphedema specialist physical therapist in [place]. It appears that he has some compression sleeve for his thighs which indeed look quite a bit better than I am used to seeing. He pumps over these with his external compression  pumps. 11/10; the patient has a new wound on the right medial thigh otherwise there is no open areas on the right. He has an area on the left leg posteriorly anteriorly and medially and an area over the left second toe. We have been using silver alginate. He thinks the injury on his thigh is secondary to friction from the compression sleeve he has. 11/17; the patient has a new wound on the right medial thigh last week. He thinks this is because he did not have a underlying stocking for his thigh juxta lite apparatus. He now has this. The area is fairly large and somewhat angry but I do not think he has underlying cellulitis. ooHe has a intact blister on the right anterior tibial area. ooSmall wound on the right great toe dorsally ooSmall area on the medial left calf. 11/30; the patient does not have any open areas on his right leg and we did not take his juxta lite stocking off. However he states that on Friday his compression wrap fell down lodging around his upper mid calf area. As usual this creates a lot of problems for him. He called urgently today to be seen for a nurse visit however the nurse visit turned into a provider visit because of extreme erythema and pain in  the left anterior tibia extending laterally and posteriorly. The area that is problematic is extensive 10/06/2019 upon evaluation today patient actually appears to be doing poorly in regard to his left lower extremity. He Dr. Leanord Hawking did place him on doxycycline this past Monday apparently due to the fact that he was doing much worse in regard to this left leg. Fortunately the doxycycline does seem to be helping. Unfortunately we are still having a very difficult time getting his edema under any type of control in order to anticipate discharge at some point. The only way were really able to control his lymphedema really is with compression wraps and that has only even seemingly temporary. He has been seeing a lymphedema clinic  they are trying to help in this regard but still this has been somewhat frustrating in general for the patient. 10/13/19 on evaluation today patient appears to be doing excellent with regard to his right lower extremity as far as the wounds are concerned. His swelling is still quite extensive unfortunately. He is still having a lot of drainage from the thigh areas bilaterally which is unfortunate. He's been going to lymphedema clinic but again he still really does not have this edema under control as far as his lower extremities are concern. With regard to his left lower extremity this seems to be improving and I do believe the doxycycline has been of benefit for him. He is about to complete the doxycycline. 10/20/2019 on evaluation today patient appears to be doing poorly in regard to his bilateral lower extremities. More in the right thigh he has a lot of irritation at this site unfortunately. In regard to the left lower extremity the wrap was not quite as high it appears and does seem to have caused him some trouble as well. Fortunately there is no evidence of systemic infection though he does have some blue-green drainage which has me concerned for the possibility of Pseudomonas. He tells me he is previously taking Cipro without complications and he really does not care for Levaquin however due to some of the side effects he has. He is not allergic to any medications specifically antibiotics that were aware of. 10/27/2019 on evaluation today patient actually does appear to be for the most part doing better when compared to last week's evaluation. With that being said he still has multiple open wounds over the bilateral lower extremities. He actually forgot to start taking the Cipro and states that he still has the whole bottle. He does have several new blisters on left lower extremity today I think I would recommend he go ahead and take the Cipro based on what I am seeing at  this point. 12/30-Patient comes at 1 week visit, 4 layer compression wraps on the left and Unna boot on the right, primary dressing Xtrasorb and silver alginate. Patient is taking his Cipro and has a few more days left probably 5-6, and the legs are doing better. He states he is using his compressions devices which I believe he has 11/10/2019 on evaluation today patient actually appears to be much better than last time I saw him 2 weeks ago. His wounds are significantly improved and overall I am very pleased in this regard. Fortunately there is no signs of active infection at this time. He is just a couple of days away from completing Cipro. Overall his edema is much better he has been using his lymphedema pumps which I think is also helping at this point. 11/17/2019 on evaluation today patient  appears to be doing excellent in regard to his wounds in general. His legs are swollen but not nearly as much as they have been in the past. Fortunately he is tolerating the compression wraps without complication. No fevers, chills, nausea, vomiting, or diarrhea. He does have some erythema however in the distal portion of his right lower extremity specifically around the forefoot and toes there is a little bit of warmth here as well. 11/24/2019 on evaluation today patient appears to be doing well with regard to his right lower extremity I really do not see any open wounds at this point. His left lower extremity does have several open areas and his right medial thigh also is open. Other than this however overall the patient seems to be making good progress and I am very pleased at this point. 12/01/2019 on evaluation today patient appears to be doing poorly at this point in regard to his left lower extremity has several new blisters despite the fact that we have him in compression wraps. In fact he had a 4-layer compression wrap, his upper thigh wrapped from lymphedema clinic, and a juxta light over top of the 4  layer compression wrap the lymphedema clinic applied and despite all this he still develop blisters underneath. Obviously this does have me concerned about the fact that unfortunately despite what we are doing to try to get wounds healed he continues to have new areas arise I do not think he is ever good to be at the point where he can realistically just use wraps at home to keep things under control. Typically when we heal him it takes about 1-2 days before he is back in the clinic with severe breakdown and blistering of his lower extremities bilaterally. This is happened numerous times in the past. Unfortunately I think that we may need some help as far as overall fluid overload to kind of limit what we are seeing and get things under better control. 12/08/2019 on evaluation today patient presents for follow-up concerning his ongoing bilateral lower extremity edema. Unfortunately he is still having quite a bit of swelling the compression wraps are controlling this to some degree but he did see Dr. [name] his cardiologist I do have that available for review today as far as the appointment was concerned that was on 12/06/2019. Obviously that she has been 2 days ago. The patient states that he is only been taking the Lasix 80 mg 1 time a day he had told me previously he was taking this twice a day. Nonetheless Dr. [name] recommended this be up to 80 mg 2 times a day for the patient as he did appear to be fluid overloaded. With that being said the patient states he did this yesterday and he was unable to go anywhere or do anything due to the fact that he was constantly having to urinate. Nonetheless I think that this is still good to be something that is important for him as far as trying to get his edema under control at all things that he is going to be able to just expect his wounds to get under control and things to be better without going through at least a period of time where he is trying to stabilize his  fluid management in general and I think increasing the Lasix is likely the first step here. It was also mentioned the possibility that the patient may require metolazone. With that being said he wanted to have the patient take Lasix twice a  day first and then reevaluating 2 months to see where things stand. 12/15/2019 upon evaluation today patient appears to be doing regard to his legs although his toes are showing some signs of weeping especially on the left at this point to some degree on the right. There does not appear to be any signs of active infection and overall I do feel like the compression wraps are doing well for him but he has not been able to take the Lasix at home and the increased dose that Dr. [name] recommended. He tells me that just not go to be feasible for him. Nonetheless I think in this case he should probably send a message to Dr. [name] in order to discuss options from the standpoint of possible admission to get the fluid off or otherwise going forward. 12/22/2019 upon evaluation today patient appears to be doing fairly well with regard to his lower extremities at this point. In fact he would be doing excellent if it was not for the fact that his right anterior thigh apparently had an allergic reaction to adhesive tape that he used. The wound itself that we have been monitoring actually appears to be healed. There is a lot of irritation at this point. 12/29/2019 upon evaluation today patient appears to be doing well in regard to his lower extremities. His left medial thigh is open and somewhat draining today but this is the only region that is open the right has done much better with the treatment utilizing the steroid cream that I prescribed for him last week. Overall I am pleased in that regard. Fortunately there is no signs of active infection at this time. No fevers, chills, nausea, vomiting, or diarrhea. 01/05/2020 upon evaluation today patient appears to be doing more poorly in  regard to his right lower extremity at this point upon evaluation today. Unfortunately he continues to have issues in this regard and I think the biggest issue is controlling his edema. This obviously is not very well controlled at this point is been recommended that he use the Lasix twice a day but he has not been able to do that. Unfortunately I think this is leading to an issue where honestly he is not really able to effectively control his edema and therefore the wounds really are not doing significantly better. I do not think that he is going to be able to keep things under good control unless he is able to control his edema much better. I discussed this again in great detail with him today. 01/12/2020 good news is patient actually appears to be doing quite well today at this point. He does have an appointment with lymphedema clinic tomorrow. His legs appear healed and the toe on the left is almost completely healed. In general I am very pleased with how things stand at this point. 01/19/2020 upon evaluation today patient appears to actually be doing well in regard to his lower extremities there is nothing open at this point. Fortunately he has done extremely well more recently. Has been seeing lymphedema clinic as well. With that being said he has Velcro wraps for his lower legs as well as his upper legs. The only wound really is on his toe which is the right great toe and this is barely anything even there. With all that being said I think it is good to be appropriate today to go ahead and switch him over to the Velcro compression wraps. 01/26/2020 upon evaluation today patient appears to be doing worse with  regard to his lower extremities after last week switch him to Velcro compression wraps. Unfortunately he lasted less than 24 hours he did not have the sock portion of his Velcro wrap on the left leg and subsequently developed a blister underneath the Velcro portion. Obviously this is not good  and not what we were looking for at this point. He states the lymphedema clinic did tell him to wear the wrap for 23 hours and take him off for 1 I am okay with that plan but again right now we got a get things back under control again he may have some cellulitis noted as well. 02/02/2020 upon evaluation today patient unfortunately appears to have several areas of blistering on his bilateral lower extremities today mainly on the feet. His legs do seem to be doing somewhat better which is good news. Fortunately there is no evidence of active infection at this time. No fevers, chills, nausea, vomiting, or diarrhea. 02/16/2020 upon evaluation today patient appears to be doing well at this time with regard to his legs. He has a couple weeping areas on his toes but for the most part everything is doing better and does appear to be sealed up on his legs which is excellent news. We can continue with wrapping him at this point as he had every time we discontinue the wraps he just breaks out with new wounds. There is really no point in is going forward with this at this point. 03/08/2020 upon evaluation today patient actually appears to be doing quite well with regard to his lower extremity ulcers. He has just a very superficial and really almost nonexistent blister on the left lower extremity he has in general done very well with the compression wraps. With that being said I do not see any signs of infection at this time which is good news. Objective Constitutional Obese and well-hydrated in no acute distress. Vitals Time Taken: 11:06 AM, Height: 70 in, Weight: 380.2 lbs, BMI: 54.5, Temperature: 98.2 F, Pulse: 58 bpm, Respiratory Rate: 20 breaths/min, Blood Pressure: 127/50 mmHg. Respiratory normal breathing without difficulty. Psychiatric this patient is able to make decisions and demonstrates good insight into disease process. Alert and Oriented x 3. pleasant and cooperative. General Notes: With regard  to the patient's legs again I feel like he is doing quite well there just a very small area on the left the remainder of his lower extremities appear to be doing quite well his toes have also sealed up and are doing well. The only thing at this point is my concern over the fact that if we discontinue his wraps he is likely to just reopen with blisters as that is what always happens. I am recommending at this point we probably wrap him for a couple weeks once he has completely healed in order to let things toughen up and then see if he can transition to his Velcro compression wraps at that point. Integumentary (Hair, Skin) Wound #162 status is Healed - Epithelialized. Original cause of wound was Gradually Appeared. The wound is located on the Right T Second. The wound oe measures 0cm length x 0cm width x 0cm depth; 0cm^2 area and 0cm^3 volume. Wound #165 status is Healed - Epithelialized. Original cause of wound was Gradually Appeared. The wound is located on the Left T Great. The wound oe measures 0cm length x 0cm width x 0cm depth; 0cm^2 area and 0cm^3 volume. Wound #167 status is Open. Original cause of wound was Shear/Friction. The wound is  located on the Left,Posterior Knee. The wound measures 0.5cm length x 1.7cm width x 0.1cm depth; 0.668cm^2 area and 0.067cm^3 volume. There is no tunneling or undermining noted. There is a medium amount of serosanguineous drainage noted. The wound margin is distinct with the outline attached to the wound base. There is large (67-100%) red granulation within the wound bed. There is no necrotic tissue within the wound bed. 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 There was a Four Layer Compression Therapy Procedure by Yevonne PaxEpps, Carrie, RN. Post procedure Diagnosis Wound #167: Same as Pre-Procedure There was a Radio broadcast assistantUnna Boot Compression Therapy Procedure by Yevonne PaxEpps, Carrie, RN. Post procedure Diagnosis Wound #: Same as Pre-Procedure Plan Follow-up Appointments: Return Appointment in 1 week. Dressing Change Frequency: Do not change entire dressing for one week. - both legs Skin Barriers/Peri-Wound Care: Moisturizing lotion - to legs Primary Wound Dressing: Wound #167 Left,Posterior Knee: Calcium Alginate with Silver Secondary Dressing: Wound #167 Left,Posterior Knee: Dry Gauze ABD pad Edema Control: 4 layer compression: Left lower extremity Unna Boot to Right Lower Extremity Avoid standing for long periods of time Elevate legs to the level of the heart or above for 30 minutes daily and/or when sitting, a frequency of: - whenever sitting Exercise regularly Segmental Compressive Device. - lymphadema pumps 60 minutes 2 times per day Other: - If feet or wraps become wet, remove wrap and apply juxtalites Additional Orders / Instructions: Other: - take lasix as prescribed by cardiologist 1. My suggestion at this time is good to be that we go ahead and continue with the 4 layer compression wraps to the left lower extremity and Unna boot to the right lower extremity. 2. Were also going to continue to use a small piece of alginate over the area on the left where he is having a little bit of weeping. Again this appears to be almost completely dried up as well. 3. In regard to his ongoing treatment I am not really sure that we can be able to cease wrapping him as every time we do so he ends up opening up with severe blisters in the next 24  hours. For that reason at least for now Monday continue to wrap both legs and we will see him next week. We will see patient back for reevaluation in 1 week here in the clinic. If anything worsens or changes patient will contact our office for additional recommendations. Electronic Signature(s) Signed: 03/08/2020 6:05:19 PM By: Lenda KelpStone III, Lawsen Arnott PA-C Entered By: Lenda KelpStone III, Kyros Salzwedel on 03/08/2020 18:05:19 -------------------------------------------------------------------------------- SuperBill Details Patient Name: Date of Service: [name], Draxton J. 03/08/2020 Medical Record Number: [id number] Patient Account Number: [id number] Date of Birth/Sex: Treating RN: 12/30/1950 (69 y.o. Damaris SchoonerM) Boehlein, Linda Primary Care Provider: Marney SettingMCGO [name], [name] Other Clinician: Referring Provider: Treating Provider/Extender: Lenda KelpStone III, Kammi [facility]chler [name] in Treatment: 215 Diagnosis Coding ICD-10 Codes Code Description 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 skin L97.221 Non-pressure chronic ulcer of left calf limited to breakdown of skin 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 of bilateral  lower extremity I89.0 Lymphedema, not elsewhere classified E11.622 Type 2 diabetes mellitus with other skin ulcer 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, unspecified L03.116 Cellulitis of left lower limb Facility Procedures CPT4 Code: 16109604 Description: (Facility Use Only) [phone] - APPLY MULTLAY COMPRS LWR LT LEG Modifier: 59 Quantity: 1 CPT4 Code: [id number] Description: (Facility Use Only) 62130QM - APPLY UNNA BOOT RT Modifier: Quantity: 1 Physician Procedures : CPT4 Code Description Modifier [id number] - WC PHYS LEVEL 3 - EST PT ICD-10 Diagnosis Description 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 skin L97.221 Non-pressure chronic ulcer of left calf limited to breakdown of  skin I87.333 Chronic  venous hypertension (idiopathic) with ulcer and inflammation of bilateral lower extremity I89.0 Lymphedema, not elsewhere classified Quantity: 1 Electronic Signature(s) Signed: 03/08/2020 6:05:44 PM By: Lenda Kelp PA-C Previous Signature: 03/08/2020 5:03:53 PM Version By: Zenaida Deed RN, BSN Entered By: Lenda Kelp on 03/08/2020 18:05:42

## 2020-03-10 ENCOUNTER — Other Ambulatory Visit: Payer: Self-pay | Admitting: Family Medicine

## 2020-03-10 DIAGNOSIS — I890 Lymphedema, not elsewhere classified: Secondary | ICD-10-CM | POA: Diagnosis not present

## 2020-03-10 DIAGNOSIS — R29898 Other symptoms and signs involving the musculoskeletal system: Secondary | ICD-10-CM | POA: Diagnosis not present

## 2020-03-11 ENCOUNTER — Other Ambulatory Visit: Payer: Self-pay

## 2020-03-14 NOTE — Progress Notes (Signed)
Pt with chronic cellulitis bilat LL's. Per ID clinic he needs monthly penicillin injections. I agree with injection in office today. Signed:  Santiago Bumpers, MD           03/14/2020

## 2020-03-15 ENCOUNTER — Encounter (HOSPITAL_BASED_OUTPATIENT_CLINIC_OR_DEPARTMENT_OTHER): Payer: Medicare Other | Admitting: Physician Assistant

## 2020-03-15 DIAGNOSIS — E119 Type 2 diabetes mellitus without complications: Secondary | ICD-10-CM | POA: Diagnosis not present

## 2020-03-15 DIAGNOSIS — L97811 Non-pressure chronic ulcer of other part of right lower leg limited to breakdown of skin: Secondary | ICD-10-CM | POA: Diagnosis not present

## 2020-03-15 DIAGNOSIS — I87333 Chronic venous hypertension (idiopathic) with ulcer and inflammation of bilateral lower extremity: Secondary | ICD-10-CM | POA: Diagnosis not present

## 2020-03-15 DIAGNOSIS — L97821 Non-pressure chronic ulcer of other part of left lower leg limited to breakdown of skin: Secondary | ICD-10-CM | POA: Diagnosis not present

## 2020-03-15 DIAGNOSIS — I890 Lymphedema, not elsewhere classified: Secondary | ICD-10-CM | POA: Diagnosis not present

## 2020-03-15 DIAGNOSIS — L03116 Cellulitis of left lower limb: Secondary | ICD-10-CM | POA: Diagnosis not present

## 2020-03-15 DIAGNOSIS — E11622 Type 2 diabetes mellitus with other skin ulcer: Secondary | ICD-10-CM | POA: Diagnosis not present

## 2020-03-15 DIAGNOSIS — E1140 Type 2 diabetes mellitus with diabetic neuropathy, unspecified: Secondary | ICD-10-CM | POA: Diagnosis not present

## 2020-03-15 LAB — HM DIABETES EYE EXAM
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
## 2020-03-17 DIAGNOSIS — R29898 Other symptoms and signs involving the musculoskeletal system: Secondary | ICD-10-CM | POA: Diagnosis not present

## 2020-03-17 DIAGNOSIS — I890 Lymphedema, not elsewhere classified: Secondary | ICD-10-CM | POA: Diagnosis not present

## 2020-03-19 ENCOUNTER — Other Ambulatory Visit: Payer: Self-pay | Admitting: Family Medicine

## 2020-03-22 ENCOUNTER — Other Ambulatory Visit: Payer: Self-pay

## 2020-03-22 ENCOUNTER — Encounter (HOSPITAL_BASED_OUTPATIENT_CLINIC_OR_DEPARTMENT_OTHER): Payer: Medicare Other | Admitting: Physician Assistant

## 2020-03-22 DIAGNOSIS — L97821 Non-pressure chronic ulcer of other part of left lower leg limited to breakdown of skin: Secondary | ICD-10-CM | POA: Diagnosis not present

## 2020-03-22 DIAGNOSIS — E1140 Type 2 diabetes mellitus with diabetic neuropathy, unspecified: Secondary | ICD-10-CM | POA: Diagnosis not present

## 2020-03-22 DIAGNOSIS — L03116 Cellulitis of left lower limb: Secondary | ICD-10-CM | POA: Diagnosis not present

## 2020-03-22 DIAGNOSIS — I87333 Chronic venous hypertension (idiopathic) with ulcer and inflammation of bilateral lower extremity: Secondary | ICD-10-CM | POA: Diagnosis not present

## 2020-03-22 DIAGNOSIS — I890 Lymphedema, not elsewhere classified: Secondary | ICD-10-CM | POA: Diagnosis not present

## 2020-03-22 DIAGNOSIS — E11622 Type 2 diabetes mellitus with other skin ulcer: Secondary | ICD-10-CM | POA: Diagnosis not present

## 2020-03-22 DIAGNOSIS — L97811 Non-pressure chronic ulcer of other part of right lower leg limited to breakdown of skin: Secondary | ICD-10-CM | POA: Diagnosis not present

## 2020-03-22 NOTE — Progress Notes (Signed)
[name], [name] ([id number]) Visit Report for 03/22/2020 Arrival Information Details Patient Name: Date of Service: Kevin [name], [name] 03/22/2020 10:15 A M Medical Record Number: [id number] Patient Account Number: [id number] Date of Birth/Sex: Treating RN: Jul 31, 1951 (69 y.o. [name], [name] Primary Care Oasis Goehring: Marney Setting, [name]: Treating Deetya Drouillard/Extender: Lenda Kelp [name], [name] in Treatment: 217 Visit Information History Since Last Visit Added or deleted any medications: No Patient Arrived: Walker Any new allergies or adverse reactions: No Arrival Time: 10:33 Had a fall or experienced change in No Accompanied By: self activities of daily living that may affect Transfer Assistance: None risk of falls: Patient Identification Verified: Yes Signs or symptoms of abuse/neglect since last visito No Secondary Verification Process Completed: Yes Hospitalized since last visit: No Patient Requires Transmission-Based Precautions: No Implantable device outside of the clinic excluding No Patient Has Alerts: Yes cellular tissue based products placed in the center since last visit: Has Dressing in Place as Prescribed: Yes Has Compression in Place as Prescribed: Yes Pain Present Now: No Electronic Signature(s) Signed: 03/22/2020 1:21:06 PM By: Shawn Stall Entered By: Shawn Stall on 03/22/2020 10:34:09 -------------------------------------------------------------------------------- Compression Therapy Details Patient Name: Date of Service: [name], [name] 03/22/2020 10:15 A M Medical Record Number: [id number] Patient Account Number: [id number] Date of Birth/Sex: Treating RN: Aug 28, 1951 (69 y.o. [name] Primary Care Henning Ehle: Marney Setting, [name] Other Clinician: Referring Dontravious Camille: Treating Naira Standiford/Extender: Lenda Kelp [name], [name] in Treatment: 217 Compression Therapy Performed for Wound Assessment: NonWound  Condition Lymphedema - Right Leg Performed By: Clinician Zenaida Deed, RN Compression Type: Henriette Combs Electronic Signature(s) Signed: 03/22/2020 1:21:06 PM By: Shawn Stall Entered By: Shawn Stall on 03/22/2020 10:36:26 -------------------------------------------------------------------------------- Compression Therapy Details Patient Name: Date of Service: Kevin -[facility], [name] 03/22/2020 10:15 A M Medical Record Number: [id number] Patient Account Number: [id number] Date of Birth/Sex: Treating RN: Nov 05, 1950 (69 y.o. [name] Primary Care Kamonte Mcmichen: Marney Setting, [name] Other Clinician: Referring Tessia Kassin: Treating Christphor Groft/Extender: Lenda Kelp [facility] [name], [name] in Treatment: 217 Compression Therapy Performed for Wound Assessment: NonWound Condition Lymphedema - Left Leg Performed By: Clinician Zenaida Deed, RN Compression Type: Four Layer Electronic Signature(s) Signed: 03/22/2020 1:21:06 PM By: Shawn Stall Entered By: Shawn Stall on 03/22/2020 10:36:46 -------------------------------------------------------------------------------- Encounter Discharge Information Details Patient Name: Date of Service: Kevin [facility], [name] 03/22/2020 10:15 A M Medical Record Number: [id number] Patient Account Number: [id number] Date of Birth/Sex: Treating RN: [birth year] (69 y.o. [name] Primary Care Rilei Kravitz: Marney Setting, [name] Other Clinician: Referring Alesandro Stueve: Treating Caeli Linehan/Extender: Lenda Kelp [name], [name] in Treatment: 217 Encounter Discharge Information Items Discharge Condition: Stable Ambulatory Status: Walker Discharge Destination: Home Transportation: Private Auto Accompanied By: self Schedule Follow-up Appointment: Yes Clinical Summary of Care: Patient Declined Electronic Signature(s) Signed: 03/22/2020 1:21:06 PM By: Shawn Stall Entered By: Shawn Stall on 03/22/2020  10:39:14 -------------------------------------------------------------------------------- Patient/Caregiver Education Details Patient Name: Date of Service: [name], [name] Medical Record Number: [id number] Patient Account Number: [id number] Date of Birth/Gender: Treating RN: 04/28/1951 (69 y.o. [name] Primary Care Physician: Marney Setting, [name] Other Clinician: Referring Physician: Treating Physician/Extender: Suzy Bouchard, [name] in Treatment: 217 Education Assessment Education Provided To: Patient Education Topics Provided Venous: Methods: Explain/Verbal Responses: Reinforcements needed, State content correctly Wound/Skin Impairment: Methods: Explain/Verbal Responses: Reinforcements needed, State content correctly Electronic Signature(s) Signed: 03/22/2020 1:21:06 PM By: Shawn Stall Entered By: Shawn Stall on 03/22/2020 10:38:52 -------------------------------------------------------------------------------- Wound  Assessment Details Patient Name: Date of Service: [name], [name] 03/22/2020 10:15 A M Medical Record Number: [id number] Patient Account Number: [id number] Date of Birth/Sex: Treating RN: 02-May-1951 (69 y.o. [name] Primary Care Gary Gabrielsen: [name], [name] Other Clinician: Referring Donnalynn Wheeless: Treating Colton Engdahl/Extender: Worthy Keeler [name], [name] in Treatment: 217 Wound Status Wound Number: [id number] Primary Etiology: Abrasion Wound Location: Left, Posterior Knee Wound Status: Healed - Epithelialized Wounding Event: Shear/Friction Date Acquired: 03/08/2020 Weeks Of Treatment: 2 Clustered Wound: No Wound Measurements Length: (cm) 0 Width: (cm) 0 Depth: (cm) 0 Area: (cm) 0 Volume: (cm) 0 % Reduction in Area: 100% % Reduction in Volume: 100% Wound Description Classification: Partial Thickness Electronic Signature(s) Signed: 03/22/2020 1:07:52 PM By: Baruch Gouty RN,  BSN Signed: 03/22/2020 1:21:06 PM By: Deon Pilling Entered By: Deon Pilling on 03/22/2020 10:34:45 -------------------------------------------------------------------------------- Vitals Details Patient Name: Date of Service: [name], [name] 03/22/2020 10:15 A M Medical Record Number: [id number] Patient Account Number: [id number] Date of Birth/Sex: Treating RN: 1951/07/05 (69 y.o. [name] Primary Care Truxton Stupka: [name], [name] Other Clinician: Referring Lovelee Forner: Treating Braydee Shimkus/Extender: Worthy Keeler [name], [name] in Treatment: 217 Vital Signs Time Taken: 10:15 Temperature (F): 97.9 Height (in): 70 Pulse (bpm): 60 Weight (lbs): 380.2 Respiratory Rate (breaths/min): 22 Body Mass Index (BMI): 54.5 Blood Pressure (mmHg): 135/71 Reference Range: 80 - 120 mg / dl Electronic Signature(s) Signed: 03/22/2020 1:21:06 PM By: Deon Pilling Entered By: Deon Pilling on 03/22/2020 10:33:14

## 2020-03-29 ENCOUNTER — Telehealth: Payer: Self-pay

## 2020-03-29 ENCOUNTER — Encounter (HOSPITAL_BASED_OUTPATIENT_CLINIC_OR_DEPARTMENT_OTHER): Payer: Medicare Other | Admitting: Physician Assistant

## 2020-03-29 DIAGNOSIS — L03116 Cellulitis of left lower limb: Secondary | ICD-10-CM | POA: Diagnosis not present

## 2020-03-29 DIAGNOSIS — L97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 right calf limited to breakdown of skin: Secondary | ICD-10-CM | POA: Diagnosis not present

## 2020-03-29 DIAGNOSIS — E11622 Type 2 diabetes mellitus with other skin ulcer: Secondary | ICD-10-CM | POA: Diagnosis not present

## 2020-03-29 DIAGNOSIS — I890 Lymphedema, not elsewhere classified: Secondary | ICD-10-CM | POA: Diagnosis not present

## 2020-03-29 DIAGNOSIS — L97811 Non-pressure chronic ulcer of other part of right lower leg limited to breakdown of skin: Secondary | ICD-10-CM | POA: Diagnosis not present

## 2020-03-29 DIAGNOSIS — E1140 Type 2 diabetes mellitus with diabetic neuropathy, unspecified: Secondary | ICD-10-CM | POA: Diagnosis not present

## 2020-03-29 DIAGNOSIS — L97221 Non-pressure chronic ulcer of left calf limited to breakdown of skin: Secondary | ICD-10-CM | POA: Diagnosis not present

## 2020-03-29 DIAGNOSIS — I87333 Chronic venous hypertension (idiopathic) with ulcer and inflammation of bilateral lower extremity: Secondary | ICD-10-CM | POA: Diagnosis not present

## 2020-03-29 DIAGNOSIS — L97821 Non-pressure chronic ulcer of other part of left lower leg limited to breakdown of skin: Secondary | ICD-10-CM | POA: Diagnosis not present

## 2020-03-29 NOTE — Progress Notes (Signed)
[name], [name] ([id number]) Visit Report for 03/22/2020 SuperBill Details Patient Name: Date of Service: [name], [name] 03/22/2020 Medical Record Number: [id number] Patient Account Number: [id number] Date of Birth/Sex: Treating RN: 03-May-1951 (69 y.o. 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 of bilateral lower extremity I89.0 Lymphedema, not elsewhere classified E11.622 Type 2 diabetes mellitus with other skin ulcer 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, unspecified L03.116 Cellulitis of left lower limb Facility Procedures CPT4 Code Description Modifier Quantity [id number] (Facility Use Only) [id number] - APPLY UNNA BOOT RT 1 [id number] (Facility Use Only) [phone] - APPLY MULTLAY COMPRS LWR LT LEG 59 1 Electronic Signature(s) Signed: 03/22/2020 1:21:06 PM By: Shawn Stall Signed: 03/29/2020 12:40:59 PM By: Lenda Kelp PA-C Entered By: Shawn Stall on 03/22/2020 10:39:36

## 2020-03-29 NOTE — Progress Notes (Signed)
JENCARLO, BONADONNA ([id number]) Visit Report for 03/15/2020 SuperBill Details Patient Name: Date of Service: [name], [name] 03/15/2020 Medical Record Number: [id number] Patient Account Number: [id number] Date of Birth/Sex: Treating RN: May 02, 1951 (69 y.o. 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 Electronic Signature(s) Signed: 03/15/2020 1:14:02 PM By: Shawn Stall Signed: 03/29/2020 12:40:59 PM By: Lenda Kelp PA-C Entered By: Shawn Stall on 03/15/2020 10:46:45

## 2020-03-29 NOTE — Telephone Encounter (Signed)
Signed and put in box to go up front. Signed:  Phil Jayli Fogleman, MD           03/29/2020  

## 2020-03-29 NOTE — Progress Notes (Addendum)
BENNET, KUJAWA ([id number]) Visit Report for 03/29/2020 Chief Complaint Document Details Patient Name: Date of Service: CO MERRIL, NAGY 03/29/2020 8:45 A M Medical Record Number: [id number] Patient Account Number: [id number] Date of Birth/Sex: Treating RN: [birth year] (69 y.o. [name] Primary Care Provider: Marney Setting, [name] Other Clinician: Referring Provider: Treating Provider/Extender: Lenda Kelp [facility] [name], [name] in Treatment: 218 Information Obtained from: Patient Chief Complaint patient is here for evaluation venous/lymphedema weeping Electronic Signature(s) Signed: 03/29/2020 9:04:52 AM By: Lenda Kelp PA-C Entered By: Lenda Kelp on 03/29/2020 09:04:52 -------------------------------------------------------------------------------- HPI Details Patient Name: Date of Service: [name], Ireoluwa J. 03/29/2020 8:45 A M Medical Record Number: [id number] Patient Account Number: [id number] Date of Birth/Sex: Treating RN: May 29, 1951 (69 y.o. [name] Primary Care Provider: Marney Setting, [name] Other Clinician: Referring Provider: Treating Provider/Extender: Lenda Kelp [facility] [name], [name] in Treatment: 218 History of Present Illness HPI Description: Referred by PCP for consultation. Patient has long standing history of BLE venous stasis, no prior ulcerations. At beginning of month, developed cellulitis and weeping. Received IM Rocephin followed by Keflex and resolved. Wears compression stocking, appr 6 months old. Not sure strength. No present drainage. 01/22/16 this is a patient who is a type II diabetic on insulin. 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 He tells me he religiously wears pressure stockings of uncertain strength. He was here with weeping edema about 8 months ago but did not have an open wound. Roughly a month ago he had a reopening on his bilateral legs. He is been using bandages and Neosporin. He  does not complain of pain. 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 Last BUN/creatinine I have is from 11/20/15 at 13 and 1.0 respectively 01/29/16; patient arrives today having tolerated the Profore wrap. He brought in his stockings and these are 18 mmHg stockings he bought from [place]. The compression here is likely inadequate. He does not complain of pain or excessive drainage she has no systemic symptoms. The wound on the right looks improved as does the one on the left although one on the left is more substantial with still tissue at risk below the actual wound area on the bilateral posterior calf 02/05/16; patient arrives with poor edema control. He states that we did put a 4 layer compression on it last week. No weight appear 5 this. 02/12/16; the area on the posterior right Has healed. The left Has a substantial wound that has necrotic surface eschar that requires a debridement with a curette. 02/16/16;the patient called or a Nurse visit secondary to increased swelling. He had been in earlier in the week with his right leg healed. He was transitioned to is on pressure stocking on the right leg with the only open wound on the left, a substantial area on the left posterior calf. Note he has a history of severe lower extremity edema, he has a history of chronic atrial fibrillation but not heart failure per my notes but I'll need to research this. He is not complaining of chest pain shortness of breath or orthopnea. The intake nurse noted blisters on the previously closed right leg 02/19/16; this is the patient's regular visit day. I see him on Friday with escalating edema new wounds on the right leg and clear signs of at least right ventricular heart failure. I increased his Lasix to  40 twice a day. He is returning currently in follow-up. States he is noticed a decrease in that the  edema 02/26/16 patient's legs have much less edema. There is nothing really open on the right leg. The left leg has improved condition of the large superficial wound on the posterior left leg 03/04/16; edema control is very much better. The patient's right leg wounds have healed. On the left leg he continues to have severe venous inflammation on the posterior aspect of the left leg. There is no tenderness and I don't think any of this is cellulitis. 03/11/16; patient's right leg is married healed and he is in his own stocking. The patient's left leg has deteriorated somewhat. There is a lot of erythema around the wound on the posterior left leg. There is also a significant rim of erythema posteriorly just above where the wrap would've ended there is a new wound in this location and a lot of tenderness. Can't rule out cellulitis in this area. 03/15/16; patient's right leg remains healed and he is in his own stocking. The patient's left leg is much better than last review. His major wound on the posterior aspect of his left Is almost fully epithelialized. He has 3 small injuries from the wraps. Really. Erythema seems a lot better on antibiotics 03/18/16; right leg remains healed and he is in his own stocking. The patient's left leg is much better. The area on the posterior aspect of the left calf is fully epithelialized. His 3 small injuries which were wrap injuries on the left are improved only one seems still open his erythema has resolved 03/25/16; patient's right leg remains healed and he is in his own stocking. There is no open area today on the left leg posterior leg is completely closed up. His wrap injuries at the superior aspect of his leg are also resolved. He looks as though he has some irritation on the dorsal ankle but this is fully epithelialized without evidence of infection. 03/28/16; we discharged this patient on Monday. Transitioned him into his own stocking. There were problems almost  immediately with uncontrolled swelling weeping edema multiple some of which have opened. He does not feel systemically unwell in particular no chest pain no shortness of breath and he does not feel 04/08/16; the edema is under better control with the Profore light wrap but he still has pitting edema. There is one large wound anteriorly 2 on the medial aspect of his left leg and 3 small areas on the superior posterior calf. Drainage is not excessive he is tolerating a Profore light well 04/15/16; put a Profore wrap on him last week. This is controlled is edema however he had a lot of pain on his left anterior foot most of his wounds are healed 04/22/16 once again the patient has denuded areas on the left anterior foot which he states are because his wrap slips up word. He saw his primary physician today is on Lasix 40 twice a day and states that he his weight is down 20 pounds over the last 3 months. 04/29/16: Much improved. left anterior foot much improved. He is now on Lasix 80 mg per day. Much improved edema control 05/06/16; I was hoping to be able to discharge him today however once again he has blisters at a low level of where the compression was placed last week mostly on his left lateral but also his left medial leg and a small area on the anterior part of the left foot. 05/09/16;  apparently the patient went home after his appointment on 7/4 later in the evening developing pain in his upper medial thigh together with subjective fever and chills although his temperature was not taken. The pain was so intense he felt he would probably have to call 911. However he then remembered that he had leftover doxycycline from a previous round of antibiotics and took these. By the next morning he felt a lot better. He called and spoke to one of our nurses and I approved doxycycline over the phone thinking that this was in relation to the wounds we had previously seen although they were definitely were not. The  patient feels a lot better old fever no chills he is still working. Blood sugars are reasonably controlled 05/13/16; patient is back in for review of his cellulitis on his anterior medial upper thigh. He is taking doxycycline this is a lot better. Culture I did of the nodular area on the dorsal aspect of his foot grew MRSA this also looks a lot better. 05/20/16; the patient is cellulitis on the medial upper thigh has resolved. All of his wound areas including the left anterior foot, areas on the medial aspect of the left calf and the lateral aspect of the calf at all resolved. He has a new blister on the left dorsal foot at the level of the fourth toe this was excised. No evidence of infection 05/27/16; patient continues to complain weeping edema. He has new blisterlike wounds on the left anterior lateral and posterior lateral calf at the top of his wrap levels. The area on his left anterior foot appears better. He is not complaining of fever, pain or pruritus in his feet. 05/30/16; the patient's blisters on his left anterior leg posterior calf all look improved. He did not increase the Lasix 100 mg as I suggested because he was going to run out of his 40 mg tablets. He is still having weeping edema of his toes 06/03/16; I renewed his Lasix at 80 mg once a day as he was about to run out when I last saw him. He is on 80 mg of Lasix now. I have asked him to cut down on the excessive amount of water he was drinking and asked him to drink according to his thirst mechanisms 06/12/2016 -- was seen 2 days ago and was supposed to wear his compression stockings at home but he is developed lymphedema and superficial blisters on the left lower extremity and hence came in for a review 06/24/16; the remaining wound is on his left anterior leg. He still has edema coming from between his toes. There is lymphedema here however his edema is generally better than when I last saw this. He has a history of atrial fibrillation  but does not have a known history of congestive heart failure nevertheless I think he probably has this at least on a diastolic basis. 07/01/16 I reviewed his echocardiogram from January 2017. This was essentially normal. He did not have LVH, EF of 55-60%. His right ventricular function was normal although he did have trivial tricuspid and pulmonic regurgitation. This is not audible on exam however. I increased his Lasix to do massive edema in his legs well above his knees I think in early July. He was also drinking an excessive amount of water at the time. 07/15/16; missed his appointment last week because of the Labor Day holiday on Monday. He could not get another appointment later in the week. Started to feel the wrap  digging in superiorly so we remove the top half and the bottom half of his wrap. He has extensive erythema and blistering superiorly in the left leg. Very tender. Very swollen. Edema in his foot with leaking edema fluid. He has not been systemically unwell 07/22/16; the area on the left leg laterally required some debridement. The medial wounds look more stable. His wrap injury wounds appear to have healed. Edema and his foot is better, weeping edema is also better. He tells me he is meeting with the supplier of the external compression pumps at work 08/05/16; the patient was on vacation last week in [facility]. His wrap is been on for an extended period of time. Also over the weekend he developed an extensive area of tender erythema across his anterior medial thigh. He took to doxycycline yesterday that he had leftover from a previous prescription. The patient complains of weeping edema coming out of his toes 08/08/16; I saw this patient on 10/2. He was tender across his anterior thigh. I put him on doxycycline. He returns today in follow-up. He does not have any open wounds on his lower leg, he still has edema weeping into his toes. 08/12/16; patient was seen back urgently today to  follow-up for his extensive left thigh cellulitis/erysipelas. He comes back with a lot less swelling and erythema pain is much better. I believe I gave him Augmentin and Cipro. His wrap was cut down as he stated a roll down his legs. He developed blistering above the level of the wrap that remained. He has 2 open blisters and 1 intact. 08/19/16; patient is been doing his primary doctor who is increased his Lasix from 40-80 once a day or 80 already has less edema. Cellulitis has remained improved in the left thigh. 2 open areas on the posterior left calf 08/26/16; he returns today having new open blisters on the anterior part of his left leg. He has his compression pumps but is not yet been shown how to use some vital representative from the supplier. 09/02/16 patient returns today with no open wounds on the left leg. Some maceration in his plantar toes 09/10/2016 -- Dr. Leanord Hawking had recently discharged him on 09/02/2016 and he has come right back with redness swelling and some open ulcers on his left lower extremity. He says this was caused by trying to apply his compression stockings and he's been unable to use this and has not been able to use his lymphedema pumps. He had some doxycycline leftover and he has started on this a few days ago. 09/16/16; there are no open wounds on his leg on the left and no evidence of cellulitis. 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 He does not complain of symptoms here. I am not clear use using his external compression pumps. 09/23/16; I have not seen this patient in 2 weeks. He canceled his appointment 10 days ago as he was going on vacation. He tells me that on Monday he noticed a large area on his posterior left leg which is been draining copiously and is reopened into a large wound. He is been using ABDs and the external part of his juxtalite, according to our nurse this was not on properly. 10/07/16;  Still a substantial area on the posterior left leg. Using silver alginate 10/14/16; in general better although there is still open area which looks healthy. Still using silver alginate. He reminds me that this happen before he  left for [facility]. T oday while he was showering in the morning. He had been using his juxtalite's 10/21/16; the area on his posterior left leg is fully epithelialized. However he arrives today with a large area of tender erythema in his medial and posterior left thigh just above the knee. I have marked the area. Once again he is reluctant to consider hospitalization. I treated him with oral antibiotics in the past for a similar situation with resolution I think with doxycycline however this area it seems more extensive to me. He is not complaining of fever but does have chills and says states he is thirsty. His blood sugar today was in the 140s at home 10/25/16 the area on his posterior left leg is fully epithelialized although there is still some weeping edema. The large area of tenderness and erythema in his medial and posterior left thigh is a lot less tender although there is still a lot of swelling in this thigh. He states he feels a lot better. He is on doxycycline and Augmentin that I started last week. This will continued until Tuesday, December 26. I have ordered a duplex ultrasound of the left thigh rule out DVT whether there is an abscess something that would need to be drained I would also like to know. 11/01/16; he still has weeping edema from a not fully epithelialized area on his left posterior calf. Most of the rest of this looks a lot better. He has completed his antibiotics. His thigh is a lot better. Duplex ultrasound did not show a DVT in the thigh 11/08/16; he comes in today with more Denuded surface epithelium from the posterior aspect of his calf. There is no real evidence of cellulitis. The superior aspect of his wrap appears to have put quite an  indentation in his leg just below the knee and this may have contributed. He does not complain of pain or fever. We have been using silver alginate as the primary dressing. The area of cellulitis in the right thigh has totally resolved. He has been using his compression stockings once a week 11/15/16; the patient arrives today with more loss of epithelium from the posterior aspect of his left calf. He now has a fairly substantial wound in this area. The reason behind this deterioration isn't exactly clear although his edema is not well controlled. He states he feels he is generally more swollen systemically. He is not complaining of chest pain shortness of breath fever. T me he has an appointment with his primary physician in early February. He is on 80 mg of oral ells Lasix a day. He claims compliance with the external compression pumps. He is not having any pain in his legs similar to what he has with his recurrent cellulitis 11/22/16; the patient arrives a follow-up of his large area on his left lateral calf. This looks somewhat better today. He came in earlier in the week for a dressing change since I saw him a week ago. He is not complaining of any pain no shortness of breath no chest pain 11/28/16; the patient arrives for follow-up of his large area on the left lateral calf this does not look better. In fact it is larger weeping edema. The surface of the wound does not look too bad. We have been using silver alginate although I'm not certain that this is a dressing issue. 12/05/16; again the patient follows up for a large wound on the left lateral and left posterior calf this does not  look better. There continues to be weeping edema necrotic surface tissue. More worrisome than this once again there is erythema below the wound involving the distal Achilles and heel suggestive of cellulitis. He is on his feet working most of the day of this is not going well. We are changing his dressing twice a week to  facilitate the drainage. 12/12/16; not much change in the overall dimensions of the large area on the left posterior calf. This is very inflamed looking. I gave him an. Doxycycline last week does not really seem to have helped. He found the wrap very painful indeed it seems to of dog into his legs superiorly and perhaps around the heel. He came in early today because the drainage had soaked through his dressings. 12/19/16- patient arrives for follow-up evaluation of his left lower extremity ulcers. He states that he is using his lymphedema pumps once daily when there is "no drainage". He admits to not using his lipedema pumps while under current treatment. His blood sugars have been consistently between 150-200. 12/26/16; the patient is not using his compression pumps at home because of the wetness on his feet. I've advised him that I think it's important for him to use this daily. He finds his feet too wet, he can put a plastic bag over his legs while he is in the pumps. Otherwise I think will be in a vicious circle. We are using silver alginate to the major area on his left posterior calf 01/02/17; the patient's posterior left leg has further of all into 3 open wounds. All of them covered with a necrotic surface. He claims to be using his compression pumps once a day. His edema control is marginal. Continue with silver alginate 01/10/17; the patient's left posterior leg actually looks somewhat better. There is less edema, less erythema. Still has 3 open areas covered with a necrotic surface requiring debridement. He claims to be using his compression pumps once a day his edema control is better 01/17/17; the patient's left posterior calf look better last week when I saw him and his wrap was changed 2 days ago. He has noted increasing pain in the left heel and arrives today with much larger wounds extensive erythema extending down into the entire heel area especially tender medially. He is not  systemically unwell CBGs have been controlled no fever. Our intake nurse showed me limegreen drainage on his AVD pads. 01/24/17; his usual this patient responds nicely to antibiotics last week giving him Levaquin for presumed Pseudomonas. The whole entire posterior part of his leg is much better much less inflamed and in the case of his Achilles heel area much less tender. He has also had some epithelialization posteriorly there are still open areas here and still draining but overall considerably better 01/31/17- He has continue to tolerate the compression wraps. he states that he continues to use the lymphedema pumps daily, and can increase to twice daily on the weekends. He is voicing no complaints or concerns regarding his LLE ulcers 02/07/17-he is here for follow-up evaluation. He states that he noted some erythema to the left medial and anterior thigh, which he states is new as of yesterday. He is concerned about recurrent cellulitis. He states his blood sugars have been slightly elevated, this morning in the 180s 02/14/17; he is here for follow-up evaluation. When he was last here there was erythema superiorly from his posterior wound in his anterior thigh. He was prescribed Levaquin however a culture of the wound surface  grew MRSA over the phone I changed him to doxycycline on Monday and things seem to be a lot better. 02/24/17; patient missed his appointment on Friday therefore we changed his nurse visit into a physician visit today. Still using silver alginate on the large area of the posterior left thigh. He isn't new area on the dorsal left second toe 03/03/17; actually better today although he admits he has not used his external compression pumps in the last 2 days or so because of work responsibilities over the weekend. 03/10/17; continued improvement. External compression pumps once a day almost all of his wounds have closed on the posterior left calf. Better edema control 03/17/17; in general  improved. He still has 3 small open areas on the lateral aspect of his left leg however most of the area on the posterior part of his leg is epithelialized. He has better edema control. He has an ABD pad under his stocking on the right anterior lower leg although he did not let us look at that today. 03/24/17; patient arrives back in clinic today with no open areas however there are areas on the posterior left calf and anterior left calf that are less than 100% epithelialized. His edema is well controlled in the left lower leg. There is some pitting edema probably lymphedema in the left upper thigh. He uses compression pumps at home once per day. I tried to get him to do this twice a day although he is very reticent. 04/01/2017 -- for the last 2 days he's had significant redness, tenderness and weeping and came in for an urgent visit today. 04/07/17; patient still has 6 more days of doxycycline. He was seen by Dr. Meyer Russel last Wednesday for cellulitis involving the posterior aspect, lateral aspect of his Involving his heel. For the most part he is better there is less erythema and less weeping. He has been on his feet for 12 hours 2 over the weekend. Using his compression pumps once a day 04/14/17 arrives today with continued improvement. Only one area on the posterior left calf that is not fully epithelialized. He has intense bilateral venous inflammation associated with his chronic venous insufficiency disease and secondary lymphedema. We have been using silver alginate to the left posterior calf wound In passing he tells [place] today that the right leg but we have not seen in quite some time has an open area on it but he doesn't want [place] to look at this today states he will show this to [place] next week. 04/21/17; there is no open area on his left leg although he still reports some weeping edema. He showed [place] his right leg today which is the first time we've seen this leg in a long time. He has a large area of  open wound on the right leg anteriorly healthy granulation. Quite a bit of swelling in the right leg and some degree of venous inflammation. He told us about the right leg in passing last week but states that deterioration in the right leg really only happened over the weekend 04/28/17; there is no open area on the left leg although there is an irritated part on the posterior which is like a wrap injury. The wound on the right leg which was new from last week at least to [place] is a lot better. 05/05/17; still no open area on the left leg. Patient is using his new compression stocking which seems to be doing a good job of controlling the edema. He states he  is using his compression pumps once per day. The right leg still has an open wound although it is better in terms of surface area. Required debridement. A lot of pain in the posterior right Achilles marked tenderness. Usually this type of presentation this patient gives concern for an active cellulitis 05/12/17; patient arrives today with his major wound from last week on the right lateral leg somewhat better. Still requiring debridement. He was using his compression stocking on the left leg however that is reopened with superficial wounds anteriorly he did not have an open wound on this leg previously. He is still using his juxta light's once daily at night. He cannot find the time to do this in the morning as he has to be at work by 7 AM 05/19/17; right lateral leg wound looks improved. No debridement required. The concerning area is on the left posterior leg which appears to almost have a subcutaneous hemorrhagic component to it. We've been using silver alginate to all the wounds 05/26/17; the right lateral leg wound continues to look improved. However the area on the left posterior calf is a tightly adherent surface. [place] using silver alginate. Because of the weeping edema in his legs there is very little good alternatives. 06/02/17; the patient left  here last week looking quite good. Major wound on the left posterior calf and a small one on the right lateral calf. Both of these look satisfactory. He tells me that by Wednesday he had noted increased pain in the left leg and drainage. He called on Thursday and Friday to get an appointment here but we were blocked. He did not go to urgent care or his primary physician. He thinks he had a fever on Thursday but did not actually take his temperature. He has not been using his compression pumps on the left leg because of pain. I advised him to go to the emergency room today for IV antibiotics for stents of left leg cellulitis but he has refused I have asked him to take 2 days off work to keep his leg elevated and he has refused this as well. In view of this I'm going to call him and Augmentin and doxycycline. 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 However after a detailed discussion regarding the risk benefits and alternatives he refuses to get admitted to the hospital. With no other recourse I will continue him on oral antibiotics as before and hopefully he'll have his infectious disease consultation this week. 06/16/2017 -- the patient was seen today by the nurse practitioner at infectious disease Ms. Dixon. 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 He has also been advise Lotrimin cream locally. she also had wise IV antibiotics for his condition if it flares up 06/23/17; patient arrives today with drainage bilaterally although the remaining wound on the left posterior calf after  cleaning up today "highlighter yellow drainage" did not look too bad. Unfortunately he has had breakdown on the right anterior leg [previously this leg had not been open and he is using a black stocking] he went  to see infectious disease and is been put on clindamycin 150 mg daily, I did not verify the dose although I'm not familiar with using clindamycin in this dosing range, perhaps for prophylaxisoo 06/27/17; I brought this patient back today to follow-up on the wound deterioration on the right lower leg together with surrounding cellulitis. I started him on doxycycline 4 days ago. This area looks better however he comes in today with intense cellulitis on the medial part of his left thigh. This is not have a wound in this area. Extremely tender. We've been using silver alginate to the wounds on the right lower leg left lower leg with bilateral 4 layer compression he is using his external compression pumps once a day 07/04/17; patient's left medial thigh cellulitis looks better. He has not been using his compression pumps as his insert said it was contraindicated with cellulitis. His right leg continues to make improvements all the wounds are still open. We only have one remaining wound on the left posterior calf. Using silver alginate to all open areas. He is on doxycycline which I started a week ago and should be finishing I gave him Augmentin after Thursday's visit for the severe cellulitis on the left medial thigh which fortunately looks better 07/14/17; the patient's left medial thigh cellulitis has resolved. The cellulitis in his right lower calf on the right also looks better. All of his wounds are stable to improved we've been using silver alginate he has completed the antibiotics I have given him. He has clindamycin 150 mg once a day prescribed by infectious disease for prophylaxis, I've advised him to start this now. We have been using bilateral Unna boots over silver alginate to the wound  areas 07/21/17; the patient is been to see infectious disease who noted his recurrent problems with cellulitis. He was not able to tolerate prophylactic clindamycin therefore he is on amoxicillin 500 twice a day. He also had a second daily dose of Lasix added By Dr. Oneta Rack but he is not taking this. Nor is he being completely compliant with his compression pumps a especially not this week. He has 2 remaining wounds one on the right posterior lateral lower leg and one on the left posterior medial lower leg. 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 The patient has Unna boots bilaterally. 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 He has bilateral Unna boots for compression and silver alginate to his wounds. Arrives today with his legs looking as good as I have seen him in quite some time. Not surprisingly his wounds look better as well with improvement on the right lateral leg venous insufficiency wound and also the left medial leg. He is still using the compression pumps once a day 08/11/17; both legs appear to be doing better wounds on the right lateral and left medial legs look better. Skin on the right leg quite good. He is been using silver alginate as the primary dressing. I'm going to use Anasept gel calcium alginate and maintain all the secondary dressings 08/18/17; the patient continues to actually do quite well. The area on his right lateral leg is just about closed the left medial also looks better although it is still moist in this area. His edema is well controlled we have been using Anasept gel  with calcium alginate and the usual secondary dressings, 4 layer compression and once daily use of his compression pumps "always been able to manage 09/01/17; the patient  continues to do reasonably well in spite of his trip to [place]. The area on the right lateral leg is epithelialized. Left is much better but still open. He has more edema and more chronic erythema on the left leg [venous inflammation] 09/08/17; he arrives today with no open wound on the right lateral leg and decently controlled edema. Unfortunately his left leg is not nearly as in his good situation as last week.he apparently had increasing edema starting on Saturday. He edema soaked through into his foot so used a plastic bag to walk around his home. The area on the medial right leg which was his open area is about the same however he has lost surface epithelium on the left lateral which is new and he has significant pain in the Achilles area of the left foot. He is already on amoxicillin chronically for prophylaxis of cellulitis in the left leg 09/15/17; he is completed a week of doxycycline and the cellulitis in the left posterior leg and Achilles area is as usual improved. He still has a lot of edema and fluid soaking through his dressings. There is no open wound on the right leg. He saw infectious disease NP today 09/22/17;As usual 1 we transition him from our compression wraps to his stockings things did not go well. He has several small open areas on the right leg. He states this was caused by the compression wrap on his skin although he did not wear this with the stockings over them. He has several superficial areas on the left leg medially laterally posteriorly. He does not have any evidence of active cellulitis especially involving the left Achilles The patient is traveling from [facility] Saturday going to [facility]. He states he isn't attempting to get an appointment with a heel objects wound center there to change his dressings. I am not completely certain whether this will work 10/06/17; the patient came in on Friday for a nurse visit and the nurse reported that his legs actually look  quite good. He arrives in clinic today for his regular follow-up visit. He has a new wound on his left third toe over the PIP probably caused by friction with his footwear. He has small areas on the left leg and a very superficial but epithelialized area on the right anterior lateral lower leg. Other than that his legs look as good as I've seen him in quite some time. We have been using silver alginate Review of systems; no chest pain no shortness of breath other than this a 10 point review of systems negative 10/20/17; seen by Dr. Meyer Russel last week. He had taken some antibiotics [doxycycline] that he had left over. Dr. Meyer Russel thought he had candida infection and declined to give him further antibiotics. He has a small wound remaining on the right lateral leg several areas on the left leg including a larger area on the left posterior several left medial and anterior and a small wound on the left lateral. The area on the left dorsal third toe looks a lot better. ROS; Gen.; no fever, respiratory no cough no sputum Cardiac no chest pain other than this 10 point review of system is negative 10/30/17; patient arrives today having fallen in the bathtub 3 days ago. It took him a while to get up. He has pain and maceration in the  wounds on his left leg which have deteriorated. He has not been using his pumps he also has some maceration on the right lateral leg. 11/03/17; patient continues to have weeping edema especially in the left leg. This saturates his dressings which were just put on on 12/27. As usual the doxycycline seems to take care of the cellulitis on his lower leg. He is not complaining of fever, chills, or other systemic symptoms. He states his leg feels a lot better on the doxycycline I gave him empirically. He also apparently gets injections at his primary doctor's officeo Rocephin for cellulitis prophylaxis. I didn't ask him about his compression pump compliance today I think that's probably  marginal. Arrives in the clinic with all of his dressings primary and secondary macerated full of fluid and he has bilateral edema 11/10/17; the patient's right leg looks some better although there is still a cluster of wounds on the right lateral. The left leg is inflamed with almost circumferential skin loss medially to laterally although we are still maintaining anteriorly. He does not have overt cellulitis there is a lot of drainage. He is not using compression pumps. We have been using silver alginate to the wound areas, there are not a lot of options here 11/17/17; the patient's right leg continues to be stable although there is still open wounds, better than last week. The inflammation in the left leg is better. Still loss of surface layer epithelium especially posteriorly. There is no overt cellulitis in the amount of edema and his left leg is really quite good, tells me he is using his compression pumps once a day. 11/24/17; patient's right leg has a small superficial wound laterally this continues to improve. The inflammation in the left leg is still improving however we have continuous surface layer epithelial loss posteriorly. There is no overt cellulitis in the amount of edema in both legs is really quite good. He states he is using his compression pumps on the left leg once a day for 5 out of 7 days 12/01/17; very small superficial areas on the right lateral leg continue to improve. Edema control in both legs is better today. He has continued loss of surface epithelialization and left posterior calf although I think this is better. We have been using silver alginate with large number of absorptive secondary dressings 4 layer on the left Unna boot on the right at his request. He tells me he is using his compression pumps once a day 12/08/17; he has no open area on the right leg is edema control is good here. On the left leg however he has marked erythema and tenderness breakdown of skin. He has  what appears to be a wrap injury just distal to the popliteal fossa. 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 There was swelling erythema and breakdown in the right leg. He has much worse skin breakdown in the left leg as well multiple open areas medially and posteriorly as well as laterally. He tells me he has been using his compression pumps but tells me he feels that the drainage out of his leg is worse when he uses a compression pumps. T be fair to him he is been saying this o for a  while however I don't know that I have really been listening to this. I wonder if the compression pumps are working properly 12/22/17;. Once again he arrives with severe erythema, weeping edema from the left greater than right leg. Noncompliance with compression pumps. New this visit he is complaining of pain on the lateral aspect of the right leg and the medial aspect of his right thigh. He apparently saw his cardiologist Dr. [name] who was ordered an echocardiogram area and I think this is a step in the right direction 12/25/17; started his doxycycline Monday night. There is still intense erythema of the right leg especially in the anterior thigh although there is less tenderness. The erythema around the wound on the right lateral calf also is less tender. He still complaining of pain in the left heel. His wounds are about the same right lateral left medial left lateral. Superficial but certainly not close to closure. He denies being systemically unwell no fever chills no abdominal pain no diarrhea 12/29/17; back in follow-up of his extensive right calf and right thigh cellulitis. I added amoxicillin to cover possible doxycycline resistant  strep. This seems to of done the trick he is in much less pain there is much less erythema and swelling. He has his echocardiogram at 11:00 this morning. X-ray of the left heel was also negative. 01/05/18; the patient arrived with his edema under much better control. Now that he is retired he is able to use his compression pumps daily and sometimes twice a day per the patient. He has a wound on the right leg the lateral wound looks better. Area on the left leg also looks a lot better. He has no evidence of cellulitis in his bilateral thighs I had a quick peak at his echocardiogram. He is in normal ejection fraction and normal left ventricular function. He has moderate pulmonary hypertension moderately reduced right ventricular function. One would have to wonder about chronic sleep apnea although he says he doesn't snore. He'll review the echocardiogram with his cardiologist. 01/12/18; the patient arrives with the edema in both legs under exemplary control. He is using his compression pumps daily and sometimes twice daily. His wound on the right lateral leg is just about closed. He still has some weeping areas on the posterior left calf and lateral left calf although everything is just about closed here as well. I have spoken with Aldean Baker who is the patient's nurse practitioner and infectious disease. She was concerned that the patient had not understood that the parenteral penicillin injections he was receiving for cellulitis prophylaxis was actually benefiting him. I don't think the patient actually saw that I would tend to agree we were certainly dealing with less infections although he had a serious one last month. 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 He is healed. He'll be placed in juxtalite compression wraps and increase his lymphedema pumps to twice daily. We will follow up again next week to ensure there are no issues with the new  regiment. 01/20/18-he is here for evaluation of bilateral lower extremity weeping edema. Yesterday he was placed in compression wrap to the right lower extremity and compression stocking to left lower shrubbery. He states he uses lymphedema pumps last night and again this morning and noted a blister to the left lower extremity. On exam he was noted to have drainage to the right lower extremity. He will be placed in Unna boots bilaterally and follow-up next week 01/26/18; patient was  actually discharged a week ago to his own juxta light stockings only to return the next day with bilateral lower extremity weeping edema.he was placed in bilateral Unna boots. He arrives today with pain in the back of his left leg. There is no open area on the right leg however there is a linear/wrap injury on the left leg and weeping edema on the left leg posteriorly. I spoke with infectious disease about 10 days ago. They were disappointed that the patient elected to discontinue prophylactic intramuscular penicillin shots as they felt it was particularly beneficial in reducing the frequency of his cellulitis. I discussed this with the patient today. He does not share this view. He'll definitely need antibiotics today. Finally he is traveling to [place] and trauma leaving this Saturday and returning a week later and he does not travel with his pumps. He is going by car 01/30/18; patient was seen 4 days ago and brought back in today for review of cellulitis in the left leg posteriorly. I put him on amoxicillin this really hasn't helped as much as I might like. He is also worried because he is traveling to [facility] trauma by car. Finally we will be rewrapping him. There is no open area on the right leg over his left leg has multiple weeping areas as usual 02/09/18; The same wrap on for 10 days. He did not pick up the last doxycycline I prescribed for him. He apparently took 4 days worth he already had. There is nothing open on  his right leg and the edema control is really quite good. He's had damage in the left leg medially and laterally especially probably related to the prolonged use of Unna boots 02/12/18; the patient arrived in clinic today for a nurse visit/wrap change. He complained of a lot of pain in the left posterior calf. He is taking doxycycline that I previously prescribed for him. Unfortunately even though he used his stockings and apparently used to compression pumps twice a day he has weeping edema coming out of the lateral part of his right leg. This is coming from the lower anterior lateral skin area. 02/16/18; the patient has finished his doxycycline and will finish the amoxicillin 2 days. The area of cellulitis in the left calf posteriorly has resolved. He is no longer having any pain. He tells me he is using his compression pumps at least once a day sometimes twice. 02/23/18; the patient finished his doxycycline and Amoxil last week. On Friday he noticed a small erythematous circle about the size of a quarter on the left lower leg just above his ankle. This rapidly expanded and he now has erythema on the lateral and posterior part of the thigh. This is bright red. Also has an area on the dorsal foot just above his toes and a tender area just below the left popliteal fossa. He came off his prophylactic penicillin injections at his own insistence one or 2 months ago. This is obviously deteriorated since then 03/02/18; patient is on doxycycline and Amoxil. Culture I did last week of the weeping area on the back of his left calf grew group B strep. I have therefore renewed the amoxicillin 500 3 times a day for a further week. He has not been systemically unwell. Still complaining of an area of discomfort right under his left popliteal fossa. There is no open wound on the right leg. He tells me that he is using his pumps twice a day on most days 03/09/18; patient arrives in  clinic today completing his amoxicillin  today. The cellulitis on his left leg is better. Furthermore he tells me that he had intramuscular penicillin shots that his primary care office today. However he also states that the wrap on his right leg fell down shortly after leaving clinic last week. He developed a large blister that was present when he came in for a nurse visit later in the week and then he developed intense discomfort around this area.He tells me he is using his compression pumps 03/16/18; the patient has completed his doxycycline. The infectious part of this/cellulitis in the left heel area left popliteal area is a lot better. He has 2 open areas on the right calf. Still areas on the left calf but this is a lot better as well. 03/24/18; the patient arrives complaining of pain in the left popliteal area again. He thinks some of this is wrap injury. He has no open area on the right leg and really no open area on the left calf either except for the popliteal area. He claims to be compliant with the compression pumps 03/31/18; I gave him doxycycline last week because of cellulitis in the left popliteal area. This is a lot better although the surface epithelium is denuded off and response to this. He arrives today with uncontrolled edema in the right calf area as well as a fingernail injury in the right lateral calf. There is only a few open areas on the left 04/06/18; I gave him amoxicillin doxycycline over the last 2 weeks that the amoxicillin should be completing currently. He is not complaining of any pain or systemic symptoms. The only open areas see has is on the right lateral lower leg paradoxically I cannot see anything on the left lower leg. He tells me he is using his compression pumps twice a day on most days. Silver alginate to the wounds that are open under 4 layer compression 04/13/18; he completed antibiotics and has no new complaints. Using his compression pumps. Silver alginate that anything that's opened 04/20/18; he is  using his compression pumps religiously. Silver alginate 4 layer compression anything that's opened. He comes in today with no open wounds on the left leg but 3 on the right including a new one posteriorly. He has 2 on the right lateral and one on the right posterior. He likes Unna boots on the right leg for reasons that aren't really clear we had the usual 4 layer compression on the left. It may be necessary to move to the 4 layer compression on the right however for now I left them in the Unna boots 04/27/18; he is using his compression pumps at least once a day. He has still the wounds on the right lateral calf. The area right posteriorly has closed. He does not have an open wound on the left under 4 layer compression however on the dorsal left foot just proximal to the toes and the left third toe 2 small open areas were identified 05/11/18; he has not uses compression pumps. The areas on the right lateral calf have coalesced into one large wound necrotic surface. On the left side he has one small wound anteriorly however the edema is now weeping out of a large part of his left leg. He says he wasn't using his pumps because of the weeping fluid. I explained to him that this is the time he needs to pump more 05/18/18; patient states he is using his compression pumps twice a day. The area on  the right lateral large wound albeit superficial. On the left side he has innumerable number of small new wounds on the left calf particularly laterally but several anteriorly and medially. All these appear to have healthy granulated base these look like the remnants of blisters however they occurred under compression. The patient arrives in clinic today with his legs somewhat better. There is certainly less edema, less multiple open areas on the left calf and the right anterior leg looks somewhat better as well superficial and a little smaller. However he relates pain and erythema over the last 3-4 days in the thigh  and I looked at this today. He has not been systemically unwell no fever no chills no change in blood sugar values 05/25/18; comes in today in a better state. The severe cellulitis on his left leg seems better with the Keflex. Not as tender. He has not been systemically unwell Hard to find an open wound on the left lower leg using his compression pumps twice a day The confluent wounds on his right lateral calf somewhat better looking. These will ultimately need debridement I didn't do this today. 06/01/18; the severe cellulitis on the left anterior thigh has resolved and he is completed his Keflex. There is no open wound on the left leg however there is a superficial excoriation at the base of the third toe dorsally. Skin on the bottom of his left foot is macerated looking. The left the wounds on the lateral right leg actually looks some better although he did require debridement of the top half of this wound area with an open curet 06/09/18 on evaluation today patient appears to be doing poorly in regard to his right lower extremity in particular this appears to likely be infected he has very thick purulent discharge along with a bright green tent to the discharge. This makes me concerned about the possibility of pseudomonas. He's also having increased discomfort at this point on evaluation. Fortunately there does not appear to be any evidence of infection spreading to the other location at this time. 06/16/18 on evaluation today patient appears to actually be doing fairly well. His ulcer has actually diminished in size quite significantly at this point which is good news. Nonetheless he still does have some evidence of infection he did see infectious disease this morning before coming here for his appointment. I did review the results of their evaluation and their note today. They did actually have him discontinue the Cipro and initiate treatment with linezolid at this time. He is doing this for the  next seven days and they recommended a follow-up in four months with them. He is the keep a log of the need for intermittent antibiotic therapy between now and when he falls back up with infectious disease. This will help them gaze what exactly they need to do to try and help them out. 06/23/18; the patient arrives today with no open wounds on the left leg and left third toe healed. He is been using his compression pumps twice a day. On the right lateral leg he still has a sizable wound but this is a lot better than last time I saw this. 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 Has been using silver alginate under 4 layer compression 06/30/18; the only open wound he has is on the right lateral leg and this looks healthy. No debridement is required. We have  been using silver alginate. He does not have an open wound on the left leg. There is apparently some drainage from the dorsal proximal third toe on the left although I see no open wound here. 07/03/18 on evaluation today patient was actually here just for a nurse visit rapid change. However when he was here on Wednesday for his rat change due to having been healed on the left and then developing blisters we initiated the wrap again knowing that he would be back today for [place] to reevaluate and see were at. Unfortunately he has developed some cellulitis into the proximal portion of his right lower extremity even into the region of his thigh. 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 He was placed on one as what at that point. Nonetheless he is done with that and has been tolerating it well otherwise. Doxycycline which in the past really did not seem to be effective for him. Nonetheless I think the best option may be for [place] to definitely reinitiate the antibiotics for a longer period of time. 07/07/18; since I  last saw this patient a week ago he has had a difficult time. At that point he did not have an open wound on his left leg. We transitioned him into juxta light stockings. He was apparently in the clinic the next day with blisters on the left lateral and left medial lower calf. He also had weeping edema fluid. He was put back into a compression wrap. He was also in the clinic on Friday with intense erythema in his right thigh. Per the patient he was started on Bactrim however that didn't work at all in terms of relieving his pain and swelling. He has taken 3 doxycycline that he had left over from last time and that seems to of helped. He has blistering on the right thigh as well. 07/14/18; the erythema on his right thigh has gotten better with doxycycline that he is finishing. 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 Presumably this cellulitis in the thigh was not related to that although I think this is a bit concerning going forward. He still has an area on the right lateral calf the blister on the right medial calf just below the knee that was discussed above. On the left 2 small open areas left medial and left lateral. Edema control is adequate. He is using his compression pumps twice a day 07/20/18; continued improvement in the condition of both legs especially the edema in his bilateral thighs. He tells me he is been losing weight through a combination of diet and exercise. He is using his compression pumps twice a day. So overall she made to the remaining wounds 07/27/2018; continued improvement in condition of both legs. His edema is well controlled. The area on the right lateral leg is just about closed he had one blisters show up on the medial left upper calf. We have him in 4 layer compression. He is going on a 10-day trip to [place], [place] and [place]. He will be driving. He wants to wear Unna boots because of  the lessening amount of constriction. He will not use compression pumps while he is away 08/05/18 on evaluation today patient actually appears to be doing decently well all things considered in regard to his bilateral lower extremities. The worst ulcer is actually only posterior aspect of  his left lower extremity with a four layer compression wrap cut into his leg a couple weeks back. He did have a trip and actually had [employer] for the trip that he is worn since he was last here. Nonetheless he feels like the [employer] actually do better for him his swelling is up a little bit but he also with his trip was not taking his Lasix on a regular set schedule like he was supposed to be. He states that obviously the reason being that he cannot drive and keep going without having to urinate too frequently which makes it difficult. He did not have his pumps with him while he was away either which I think also maybe playing a role here too. 08/13/2018; the patient only has a small open wound on the right lateral calf which is a big improvement in the last month or 2. He also has the area posteriorly just below the posterior fossa on the left which I think was a wrap injury from several weeks ago. He has no current evidence of cellulitis. He tells me he is back into his compression pumps twice a day. He also tells me that while he was at the laundromat somebody stole a section of his extremitease stockings 08/20/2018; back in the clinic with a much improved state. He only has small areas on the right lateral mid calf which is just about healed. This was is more substantial area for quite a prolonged period of time. He has a small open area on the left anterior tibia. The area on the posterior calf just below the popliteal fossa is closed today. He is using his compression pumps twice a day 08/28/2018; patient has no open wound on the right leg. He has a smattering of open areas on the calf with some  weeping lymphedema. More problematically than that it looks as though his wraps of slipped down in his usual he has very angry upper area of edema just below the right medial knee and on the right lateral calf. He has no open area on his feet. The patient is traveling to [facility] next week. I will send him in an antibiotic. We will continue to wrap the right leg. We ordered extremitease stockings for him last week and I plan to transition the right leg to a stocking when he gets home which will be in 10 days time. As usual he is very reluctant to take his pumps with him when he travels 09/07/2018; patient returns from [facility]. He shows me a picture of his left leg in the mid part of his trip last week with intense fire engine erythema. The picture look bad enough I would have considered sending him to the hospital. Instead he went to the wound care center in [facility]. They did not prescribe him antibiotics but he did take some doxycycline he had leftover from a previous visit. I had given him trimethoprim sulfamethoxazole before he left this did not work according to the patient. This is resulted in some improvement fortunately. He comes back with a large wound on the left posterior calf. Smaller area on the left anterior tibia. Denuded blisters on the dorsal left foot over his toes. Does not have much in the way of wounds on the right leg although he does have a very tender area on the right posterior area just below the popliteal fossa also suggestive of infection. He promises me he is back on his  pumps twice a day 09/15/2018; the intense cellulitis in his left lower calf is a lot better. The wound area on the posterior left calf is also so better. However he has reasonably extensive wounds on the dorsal aspect of his second and third toes and the proximal foot just at the base of the toes. There is nothing open on the right leg 09/22/2018; the patient has excellent edema  control in his legs bilaterally. He is using his external compression pumps twice a day. He has no open area on the right leg and only the areas in the left foot dorsally second and third toe area on the left side. He does not have any signs of active cellulitis. 10/06/2018; the patient has good edema control bilaterally. He has no open wound on the right leg. There is a blister in the posterior aspect of his left calf that we had to deal with today. He is using his compression pumps twice a day. There is no signs of active cellulitis. We have been using silver alginate to the wound areas. He still has vulnerable areas on the base of his left first second toes dorsally He has a his extremities stockings and we are going to transition him today into the stocking on the right leg. He is cautioned that he will need to continue to use the compression pumps twice a day. If he notices uncontrolled edema in the right leg he may need to go to 3 times a day. 10/13/2018; the patient came in for a nurse check on Friday he has a large flaccid blister on the right medial calf just below the knee. We unroofed this. He has this and a new area underneath the posterior mid calf which was undoubtedly a blister as well. He also has several small areas on the right which is the area we put his extremities stocking on. 10/19/2018; the patient went to see infectious disease this morning I am not sure if that was a routine follow-up in any case the doxycycline I had given him was discontinued and started on linezolid. He has not started this. It is easy to look at his left calf and the inflammation and think this is cellulitis however he is very tender in the tissue just below the popliteal fossa and I have no doubt that there is infection going on here. He states the problem he is having is that with the compression pumps the edema goes down and then starts walking the wrap falls down. We will see if we can adhere this. He  has 1 or 2 minuscule open areas on the right still areas that are weeping on the posterior left calf, the base of his left second and third toes 10/26/18; back today in clinic with quite of skin breakdown in his left anterior leg. This may have been infection the area below the popliteal fossa seems a lot better however tremendous epithelial loss on the left anterior mid tibia area over quite inexpensive tissue. He has 2 blisters on the right side but no other open wound here. 10/29/2018; came in urgently to see [place] today and we worked him in for review. He states that the 4 layer compression on the right leg caused pain he had to cut it down to roughly his mid calf this caused swelling above the wrap and he has blisters and skin breakdown today. As a result of the pain he has not been using his pumps. Both legs are a lot more  edematous and there is a lot of weeping fluid. 11/02/18; arrives in clinic with continued difficulties in the right leg> left. Leg is swollen and painful. multiple skin blisters and new open areas especially laterally. He has not been using his pumps on the right leg. 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 He is not systemically unwell. 11/09/2018; the patient claims he is being compliant with his pumps. He is finished the doxycycline I gave him last week. Culture I did of the wound on the right lateral leg showed a few very resistant methicillin staph aureus. This was resistant to doxycycline. Nevertheless he states the pain in the leg is a lot better which makes me wonder if the cultured organism was not really what was causing the problem nevertheless this is a very dangerous organism to be culturing out of any wound. His right leg is still a lot larger than the left. He is using an Radio broadcast assistant on this area, he blames a 4-layer compression for causing the original skin breakdown which I doubt is true however I cannot talk him out  of it. We have been using silver alginate to all of these areas which were initially blisters 11/16/2018; patient is being compliant with his external compression pumps at twice a day. Miraculously he arrives in clinic today with absolutely no open wounds. He has better edema control on the left where he has been using 4 layer compression versus wound of wounds on the right and I pointed this out to him. There is no inflammation in the skin in his lower legs which is also somewhat unusual for him. There is no open wounds on the dorsal left foot. He has extremitease stockings at home and I have asked him to bring these in next week. 11/25/18 patient's lower extremity on examination today on the left appears for the most part to be wound free. He does have an open wound on the lateral aspect of the right lower extremity but this is minimal compared to what I've seen in past. He does request that we go ahead and wrap the left leg as well even though there's nothing open just so hopefully it will not reopen in short order. 1/28; patient has superficial open wounds on the right lateral calf left anterior calf and left posterior calf. His edema control is adequate. 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 We have been using silver alginate as the primary dressing. He claims compliance with his compression pumps 2/4; patient has superficial open wounds on numerous areas of his left calf and again one on the left dorsal foot. The areas on the right lateral calf have healed. The cellulitis that I gave him doxycycline for last week is also resolved this was mostly on the left anterior calf just below the tibial tuberosity. His edema looks fairly well-controlled. He tells me he went to see his primary doctor today and had blood work ordered 2/11; once again he has several open areas on the left calf left tibial area. Most of  these are small and appear to have healthy granulation. He does not have anything open on the right. The edema and control in his thighs is pretty good which is usually a good indication he has been using his pumps as requested. 2/18; he continues to have several small areas on the left calf and left tibial  area. Most of these are small healthy granulation. We put him in his stocking on the right leg last week and he arrives with a superficial open area over the right upper tibia and a fairly large area on the right lateral tibia in similar condition. His edema control actually does not look too bad, he claims to be using his compression pumps twice a day 2/25. Continued small areas on the left calf and left tibial area. New areas especially on the right are identified just below the tibial tuberosity and on the right upper tibia itself. There are also areas of weeping edema fluid even without an obvious wound. He does not have a considerable degree of lymphedema but clearly there is more edema here than his skin can handle. He states he is using the pumps twice a day. We have an Unna boot on the right and 4 layer compression on the left. 3/3; he continues to have an area on the right lateral calf and right posterior calf just below the popliteal fossa. There is a fair amount of tenderness around the wound on the popliteal fossa but I did not see any evidence of cellulitis, could just be that the wrap came down and rubbed in this area. He does not have an open area on the left leg however there is an area on the left dorsal foot at the base of the third toe We have been using silver alginate to all wound areas 3/10; he did not have an open area on his left leg last time he was here a week ago. T oday he arrives with a horizontal wound just below the tibial tuberosity and an area on the left lateral calf. He has intense erythema and tenderness in this area. The area is on the right lateral calf and  right posterior calf better than last week. We have been using silver alginate as usual 3/18 - Patient returns with 3 small open areas on left calf, and 1 small open area on right calf, the skin looks ok with no significant erythema, he continues the UNA boot on right and 4 layer compression on left. The right lateral calf wound is closed , the right posterior is small area. we will continue silver alginate to the areas. 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 He is been tolerating the dressing changes without complication. Fortunately she has made excellent improvement in regard to the overall status of his wounds. Unfortunately every time we cease wrapping him he ends up reopening in causing more significant issues at that point. Again I'm unsure of the best direction to take although I think the lymphedema clinic may be appropriate for him. 02/03/19 on evaluation today patient appears to be doing well in regard to the wounds that we saw him for last week unfortunately he has a new area on the proximal portion of his right medial/posterior lower extremity where the wrap somewhat slowed down and caused swelling and a blister to rub and open. Unfortunately this is the only opening that he has on either leg at this point. 02/17/19 on evaluation today patient's bilateral lower extremities appear to be doing well. He still completely healed in regard to the left lower extremity. In regard to the right lower extremity the area where the wrap and slid down  and caused the blister still seems to be slightly open although this is dramatically better than during the last evaluation two weeks ago. I'm very pleased with the way this stands overall. 03/03/19 on evaluation today patient appears to be doing well in regard  to his right lower extremity in general although he did have a new blister open this does not appear to be showing any evidence of active infection at this time. Fortunately there's No fevers, chills, nausea, or vomiting noted at this time. Overall I feel like he is making good progress it does feel like that the right leg will we perform the [employer] seems to do with a bit better than three layer wrap on the left which slid down on him. We may switch to doing bilateral in the book wraps. 5/4; I have not seen Mr. Goodenow in quite some time. According to our case manager he did not have an open wound on his left leg last week. He had 1 remaining wound on the right posterior medial calf. He arrives today with multiple openings on the left leg probably were blisters and/or wrap injuries from Unna boots. I do not think the Unna boot's will provide adequate compression on the left. I am also not clear about the frequency he is using the compression pumps. 03/17/19 on evaluation today patient appears to be doing excellent in regard to his lower extremities compared to last week's evaluation apparently. He had gotten significantly worse last week which is unfortunate. The [employer] wrap on the left did not seem to do very well for him at all and in fact it didn't control his swelling significantly enough he had an additional outbreak. Subsequently we go back to the four layer compression wrap on the left. This is good news. At least in that he is doing better and the wound seem to be killing him. He still has not heard anything from the lymphedema clinic. 03/24/19 on evaluation today patient actually appears to be doing much better in regard to his bilateral lower Trinity as compared to last week when I saw him. Fortunately there's no signs of active infection at this time. He has been tolerating the dressing changes without complication. Overall I'm extremely pleased with the progress and appearance in  general. 04/07/19 on evaluation today patient appears to be doing well in regard to his bilateral lower extremities. His swelling is significantly down from where it was previous. With that being said he does have a couple blisters still open at this point but fortunately nothing that seems to be too severe and again the majority of the larger openings has healed at this time. 04/14/19 on evaluation today patient actually appears to be doing quite well in regard to his bilateral lower extremities in fact I'm not even sure there's anything significantly open at this time at any site. Nonetheless he did have some trouble with these wraps where they are somewhat irritating him secondary to the fact that he has noted that the graph wasn't too close down to the end of this foot in a little bit short as well up to his knee. Otherwise things seem to be doing quite well. 04/21/19 upon evaluation today patient's wound bed actually showed evidence of being completely healed in regard to both lower extremities which is excellent news. There does not appear to be any signs of active infection which is also good news. I'm very pleased in this regard. No fevers, chills, nausea, or vomiting noted  at this time. 04/28/19 on evaluation today patient appears to be doing a little bit worse in regard to both lower extremities on the left mainly due to the fact that when he went infection disease the wrap was not wrapped quite high enough he developed a blister above this. On the right he is a small open area of nothing too significant but again this is continuing to give him some trouble he has been were in the Velcro compression that he has at home. 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 He's been tolerating the dressing changes without complication. Fortunately there's no signs of active infection at this time. No fevers, chills, nausea, or vomiting noted at  this time. We have been trying to get an appointment with her lymphedema clinic in [facility] but unfortunately nobody can get them on phone with not been able to even fax information over the patient likewise is not been able to get in touch with them. Overall I'm not sure exactly what's going on here with to reach out again today. 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 Still having a lot of drainage unfortunately. He tells me especially in the left but even on the right. There's no signs of active infection which is good news we've been using so ratcheted up to this point. 05/19/19 on evaluation today patient actually appears to be doing quite well with regard to his left lower extremity which is great news. Fortunately in regard to the right lower extremity has an issues with his wrap and he subsequently did remove this from what I'm understanding. Nonetheless long story short is what he had rewrapped once he removed it subsequently had maggots underneath this wrap whenever he came in for evaluation today. With that being said they were obviously completely cleaned away by the nursing staff. The visit today which is excellent news. However he does appear to potentially have some infection around the right ankle region where the maggots were located as well. He will likely require anabiotic therapy today. 05/26/19 on evaluation today patient actually appears to be doing much better in regard to his bilateral lower extremities. I feel like the infection is under much better control. With that being said there were maggots noted when the wrap was removed yet again today. Again this could have potentially been left over from previous although at this time there does not appear to be any signs of significant drainage there was obviously on the wrap some drainage as well this contracted gnats or  otherwise. Either way I do not see anything that appears to be doing worse in my pinion and in fact I think his drainage has slowed down quite significantly likely mainly due to the fact to his infection being under better control. 06/02/2019 on evaluation today patient actually appears to be doing well with regard to his bilateral lower extremities there is no signs of active infection at this time which is great news. With that being said he does have several open areas more so on the right than the left but nonetheless these are all significantly better than previously noted. 06/09/2019 on evaluation today patient actually appears to be doing well. His wrap stayed up and he did not cause any problems he had more drainage on the right compared to the left but overall I do not see  any major issues at this time which is great news. 06/16/2019 on evaluation today patient appears to be doing excellent with regard to his lower extremities the only area that is open is a new blister that can have opened as of today on the medial ankle on the left. Other than this he really seems to be doing great I see no major issues at this point. 06/23/2019 on evaluation today patient appears to be doing quite well with regard to his bilateral lower extremities. In fact he actually appears to be almost completely healed there is a small area of weeping noted of the right lower extremity just above the ankle. Nonetheless fortunately there is no signs of active infection at this time which is good news. No fevers, chills, nausea, vomiting, or diarrhea. 8/24; the patient arrived for a nurse visit today but complained of very significant pain in the left leg and therefore I was asked to look at this. Noted that he did not have an open area on the left leg last week nevertheless this was wrapped. The patient states that he is not been able to put his compression pumps on the left leg because of the discomfort. He has not been  systemically unwell 06/30/2019 on evaluation today patient unfortunately despite being excellent last week is doing much worse with regard to his left lower extremity today. In fact he had to come in for a nurse on Monday where his left leg had to be rewrapped due to excessive weeping Dr. Leanord Hawking placed him on doxycycline at that point. Fortunately there is no signs of active infection Systemically at this time which is good news. 07/07/2019 in regard to the patient's wounds today he actually seems to be doing well with his right lower extremity there really is nothing open or draining at this point this is great news. Unfortunately the left lower extremity is given him additional trouble at this time. There does not appear to be any signs of active infection nonetheless he does have a lot of edema and swelling noted at this point as well as blistering all of which has led to a much more poor appearing leg at this time compared to where it was 2 weeks ago when it was almost completely healed. Obviously this is a little discouraging for the patient. He is try to contact the lymphedema clinic in [place] he has not been able to get through to them. 07/14/2019 on evaluation today patient actually appears to be doing slightly better with regard to his left lower extremity ulcers. Overall I do feel like at least at the top of the wrap that we have been placing this area has healed quite nicely and looks much better. The remainder of the leg is showing signs of improvement. Unfortunately in the thigh area he still has an open region on the left and again on the right he has been utilizing just a Band-Aid on an area that also opened on the thigh. Again this is an area that were not able to wrap although we did do an Ace wrap to provide some compression that something that obviously is a little less effective than the compression wraps we have been using on the lower portion of the leg. He does have an  appointment with the lymphedema clinic in [facility] on Friday. 07/21/2019 on evaluation today patient appears to be doing better with regard to his lower extremity ulcers. He has been tolerating the dressing changes without complication. Fortunately there is  no signs of active infection at this time. No fevers, chills, nausea, vomiting, or diarrhea. I did receive the paperwork from the physical therapist at the lymphedema clinic in [place]. Subsequently I signed off on that this morning and sent that back to him for further progression with the treatment plan. 07/28/2019 on evaluation today patient appears to be doing very well with regard to his right lower extremity where I do not see any open wounds at this point. Fortunately he is feeling great as far as that is concerned as well. In regard to the left lower extremity he has been having issues with still several areas of weeping and edema although the upper leg is doing better his lower leg still I think is going require the compression wrap at this time. No fevers, chills, nausea, vomiting, or diarrhea. 08/04/2019 on evaluation today patient unfortunately is having new wounds on the right lower extremity. Again we have been using Unna boot wrap on that side. We switched him to using his juxta lite wrap at home. With that being said he tells me he has been using it although his legs extremely swollen and to be honest really does not appear that he has been. I cannot know that for sure however. Nonetheless he has multiple new wounds on the right lower extremity at this time. Obviously we will have to see about getting this rewrapped for him today. 08/11/2019 on evaluation today patient appears to be doing fairly well with regard to his wounds. He has been tolerating the dressing changes including the compression wraps without complication. He still has a lot of edema in his upper thigh regions bilaterally he is supposed to be seeing the  lymphedema clinic on the 15th of this month once his wraps arrive for the upper part of his legs. 08/18/2019 on evaluation today patient appears to be doing well with regard to his bilateral lower extremities at this point. He has been tolerating the dressing changes without complication. Fortunately there is no signs of active infection which is also good news. He does have a couple weeping areas on the first and second toe of the right foot he also has just a small area on the left foot upper leg and a small area on the left lower leg but overall he is doing quite well in my opinion. He is supposed to be getting his wraps shortly in fact tomorrow and then subsequently is seeing the lymphedema clinic next Wednesday on the 21st. Of note he is also leaving on the 25th to go on vacation for a [place] to the beach. For that reason and since there is some uncertainty about what there can be doing at lymphedema clinic next Wednesday I am get a make an appointment for next Friday here for [place] to see what we need to do for him prior to him leaving for vacation. 10/23; patient arrives in considerable pain predominantly in the upper posterior calf just distal to the popliteal fossa also in the wound anteriorly above the major wound. This is probably cellulitis and he has had this recurrently in the past. He has no open wound on the right side and he has had an Radio broadcast assistant in that area. Finally I note that he has an area on the left posterior calf which by enlarge is mostly epithelialized. This protrudes beyond the borders of the surrounding skin in the setting of dry scaly skin and lymphedema. The patient is leaving for [place] on Sunday. Per  his longstanding pattern, he will not take his compression pumps with him predominantly out of fear that they will be stolen. He therefore asked that we put a Unna boot back on the right leg. He will also contact the wound care center in [facility] to see if they can  change his dressing in the mid week. 11/3; patient returned from his vacation to [facility]. He was seen on 1 occasion at their wound care center. They did a 2 layer compression system as they did not have our 4-layer wrap. I am not completely certain what they put on the wounds. They did not change the Unna boot on the right. The patient is also seeing a lymphedema specialist physical therapist in [place]. It appears that he has some compression sleeve for his thighs which indeed look quite a bit better than I am used to seeing. He pumps over these with his external compression pumps. 11/10; the patient has a new wound on the right medial thigh otherwise there is no open areas on the right. He has an area on the left leg posteriorly anteriorly and medially and an area over the left second toe. We have been using silver alginate. He thinks the injury on his thigh is secondary to friction from the compression sleeve he has. 11/17; the patient has a new wound on the right medial thigh last week. He thinks this is because he did not have a underlying stocking for his thigh juxta lite apparatus. He now has this. The area is fairly large and somewhat angry but I do not think he has underlying cellulitis. He has a intact blister on the right anterior tibial area. Small wound on the right great toe dorsally Small area on the medial left calf. 11/30; the patient does not have any open areas on his right leg and we did not take his juxta lite stocking off. However he states that on Friday his compression wrap fell down lodging around his upper mid calf area. As usual this creates a lot of problems for him. He called urgently today to be seen for a nurse visit however the nurse visit turned into a provider visit because of extreme erythema and pain in the left anterior tibia extending laterally and posteriorly. The area that is problematic is extensive 10/06/2019 upon evaluation today patient actually  appears to be doing poorly in regard to his left lower extremity. He Dr. Leanord Hawking did place him on doxycycline this past Monday apparently due to the fact that he was doing much worse in regard to this left leg. Fortunately the doxycycline does seem to be helping. Unfortunately we are still having a very difficult time getting his edema under any type of control in order to anticipate discharge at some point. The only way were really able to control his lymphedema really is with compression wraps and that has only even seemingly temporary. He has been seeing a lymphedema clinic they are trying to help in this regard but still this has been somewhat frustrating in general for the patient. 10/13/19 on evaluation today patient appears to be doing excellent with regard to his right lower extremity as far as the wounds are concerned. His swelling is still quite extensive unfortunately. He is still having a lot of drainage from the thigh areas bilaterally which is unfortunate. He's been going to lymphedema clinic but again he still really does not have this edema under control as far as his lower extremities are concern. With regard  to his left lower extremity this seems to be improving and I do believe the doxycycline has been of benefit for him. He is about to complete the doxycycline. 10/20/2019 on evaluation today patient appears to be doing poorly in regard to his bilateral lower extremities. More in the right thigh he has a lot of irritation at this site unfortunately. In regard to the left lower extremity the wrap was not quite as high it appears and does seem to have caused him some trouble as well. Fortunately there is no evidence of systemic infection though he does have some blue-green drainage which has me concerned for the possibility of Pseudomonas. He tells me he is previously taking Cipro without complications and he really does not care for Levaquin however due to some of the side effects he  has. He is not allergic to any medications specifically antibiotics that were aware of. 10/27/2019 on evaluation today patient actually does appear to be for the most part doing better when compared to last week's evaluation. With that being said he still has multiple open wounds over the bilateral lower extremities. He actually forgot to start taking the Cipro and states that he still has the whole bottle. He does have several new blisters on left lower extremity today I think I would recommend he go ahead and take the Cipro based on what I am seeing at this point. 12/30-Patient comes at 1 week visit, 4 layer compression wraps on the left and Unna boot on the right, primary dressing Xtrasorb and silver alginate. Patient is taking his Cipro and has a few more days left probably 5-6, and the legs are doing better. He states he is using his compressions devices which I believe he has 11/10/2019 on evaluation today patient actually appears to be much better than last time I saw him 2 weeks ago. His wounds are significantly improved and overall I am very pleased in this regard. Fortunately there is no signs of active infection at this time. He is just a couple of days away from completing Cipro. Overall his edema is much better he has been using his lymphedema pumps which I think is also helping at this point. 11/17/2019 on evaluation today patient appears to be doing excellent in regard to his wounds in general. His legs are swollen but not nearly as much as they have been in the past. Fortunately he is tolerating the compression wraps without complication. No fevers, chills, nausea, vomiting, or diarrhea. He does have some erythema however in the distal portion of his right lower extremity specifically around the forefoot and toes there is a little bit of warmth here as well. 11/24/2019 on evaluation today patient appears to be doing well with regard to his right lower extremity I really do not see any open  wounds at this point. His left lower extremity does have several open areas and his right medial thigh also is open. Other than this however overall the patient seems to be making good progress and I am very pleased at this point. 12/01/2019 on evaluation today patient appears to be doing poorly at this point in regard to his left lower extremity has several new blisters despite the fact that we have him in compression wraps. In fact he had a 4-layer compression wrap, his upper thigh wrapped from lymphedema clinic, and a juxta light over top of the 4 layer compression wrap the lymphedema clinic applied and despite all this he still develop blisters underneath. Obviously this does  have me concerned about the fact that unfortunately despite what we are doing to try to get wounds healed he continues to have new areas arise I do not think he is ever good to be at the point where he can realistically just use wraps at home to keep things under control. Typically when we heal him it takes about 1-2 days before he is back in the clinic with severe breakdown and blistering of his lower extremities bilaterally. This is happened numerous times in the past. Unfortunately I think that we may need some help as far as overall fluid overload to kind of limit what we are seeing and get things under better control. 12/08/2019 on evaluation today patient presents for follow-up concerning his ongoing bilateral lower extremity edema. Unfortunately he is still having quite a bit of swelling the compression wraps are controlling this to some degree but he did see Dr. Debara Pickett his cardiologist I do have that available for review today as far as the appointment was concerned that was on 12/06/2019. Obviously that she has been 2 days ago. The patient states that he is only been taking the Lasix 80 mg 1 time a day he had told me previously he was taking this twice a day. Nonetheless Dr. Debara Pickett recommended this be up to 80 mg 2 times a  day for the patient as he did appear to be fluid overloaded. With that being said the patient states he did this yesterday and he was unable to go anywhere or do anything due to the fact that he was constantly having to urinate. Nonetheless I think that this is still good to be something that is important for him as far as trying to get his edema under control at all things that he is going to be able to just expect his wounds to get under control and things to be better without going through at least a period of time where he is trying to stabilize his fluid management in general and I think increasing the Lasix is likely the first step here. It was also mentioned the possibility that the patient may require metolazone. With that being said he wanted to have the patient take Lasix twice a day first and then reevaluating 2 months to see where things stand. 12/15/2019 upon evaluation today patient appears to be doing regard to his legs although his toes are showing some signs of weeping especially on the left at this point to some degree on the right. There does not appear to be any signs of active infection and overall I do feel like the compression wraps are doing well for him but he has not been able to take the Lasix at home and the increased dose that Dr. Debara Pickett recommended. He tells me that just not go to be feasible for him. Nonetheless I think in this case he should probably send a message to Dr. Debara Pickett in order to discuss options from the standpoint of possible admission to get the fluid off or otherwise going forward. 12/22/2019 upon evaluation today patient appears to be doing fairly well with regard to his lower extremities at this point. In fact he would be doing excellent if it was not for the fact that his right anterior thigh apparently had an allergic reaction to adhesive tape that he used. The wound itself that we have been monitoring actually appears to be healed. There is a lot of  irritation at this point. 12/29/2019 upon evaluation today patient appears to  be doing well in regard to his lower extremities. His left medial thigh is open and somewhat draining today but this is the only region that is open the right has done much better with the treatment utilizing the steroid cream that I prescribed for him last week. Overall I am pleased in that regard. Fortunately there is no signs of active infection at this time. No fevers, chills, nausea, vomiting, or diarrhea. 01/05/2020 upon evaluation today patient appears to be doing more poorly in regard to his right lower extremity at this point upon evaluation today. Unfortunately he continues to have issues in this regard and I think the biggest issue is controlling his edema. This obviously is not very well controlled at this point is been recommended that he use the Lasix twice a day but he has not been able to do that. Unfortunately I think this is leading to an issue where honestly he is not really able to effectively control his edema and therefore the wounds really are not doing significantly better. I do not think that he is going to be able to keep things under good control unless he is able to control his edema much better. I discussed this again in great detail with him today. 01/12/2020 good news is patient actually appears to be doing quite well today at this point. He does have an appointment with lymphedema clinic tomorrow. His legs appear healed and the toe on the left is almost completely healed. In general I am very pleased with how things stand at this point. 01/19/2020 upon evaluation today patient appears to actually be doing well in regard to his lower extremities there is nothing open at this point. Fortunately he has done extremely well more recently. Has been seeing lymphedema clinic as well. With that being said he has Velcro wraps for his lower legs as well as his upper legs. The only wound really is on his toe  which is the right great toe and this is barely anything even there. With all that being said I think it is good to be appropriate today to go ahead and switch him over to the Velcro compression wraps. 01/26/2020 upon evaluation today patient appears to be doing worse with regard to his lower extremities after last week switch him to Velcro compression wraps. Unfortunately he lasted less than 24 hours he did not have the sock portion of his Velcro wrap on the left leg and subsequently developed a blister underneath the Velcro portion. Obviously this is not good and not what we were looking for at this point. He states the lymphedema clinic did tell him to wear the wrap for 23 hours and take him off for 1 I am okay with that plan but again right now we got a get things back under control again he may have some cellulitis noted as well. 02/02/2020 upon evaluation today patient unfortunately appears to have several areas of blistering on his bilateral lower extremities today mainly on the feet. His legs do seem to be doing somewhat better which is good news. Fortunately there is no evidence of active infection at this time. No fevers, chills, nausea, vomiting, or diarrhea. 02/16/2020 upon evaluation today patient appears to be doing well at this time with regard to his legs. He has a couple weeping areas on his toes but for the most part everything is doing better and does appear to be sealed up on his legs which is excellent news. We can continue with wrapping him  at this point as he had every time we discontinue the wraps he just breaks out with new wounds. There is really no point in is going forward with this at this point. 03/08/2020 upon evaluation today patient actually appears to be doing quite well with regard to his lower extremity ulcers. He has just a very superficial and really almost nonexistent blister on the left lower extremity he has in general done very well with the compression wraps. With  that being said I do not see any signs of infection at this time which is good news. 03/29/2020 upon evaluation today patient appears to be doing well with regard to his wounds currently except for where he had several new areas that opened up due to some of the wrap slipping and causing him trouble. He states he did not realize they had slipped. Nonetheless he has a 1 area on the right and 3 new areas on the left. Fortunately there is no signs of active infection at this time which is great news. Electronic Signature(s) Signed: 03/29/2020 9:46:12 AM By: Lenda Kelp PA-C Entered By: Lenda Kelp on 03/29/2020 09:46:11 -------------------------------------------------------------------------------- Physical Exam Details Patient Name: Date of Service: [facility], Aram J. 03/29/2020 8:45 A M Medical Record Number: [id number] Patient Account Number: [id number] Date of Birth/Sex: Treating RN: 1951-04-05 (69 y.o. [name] Primary Care Provider: Marney Setting, [name] Other Clinician: Referring Provider: Treating Provider/Extender: Lenda Kelp [name], [name] in Treatment: 218 Constitutional Obese and well-hydrated in no acute distress. Respiratory normal breathing without difficulty. Psychiatric this patient is able to make decisions and demonstrates good insight into disease process. Alert and Oriented x 3. pleasant and cooperative. Notes Patient's wound bed currently showed signs of good epithelization at several locations although he does have some breakdown at other spots. Again the biggest issue was on his right lower extremity where the wrap slipped down but did begin to his upper leg just below the knee and unfortunately has caused some issues in this regard. Other than that overall I feel like the wounds are not significant although they definitely do need to be addressed. Electronic Signature(s) Signed: 03/29/2020 9:46:47 AM By: Lenda Kelp PA-C Entered By: Lenda Kelp on 03/29/2020 09:46:47 -------------------------------------------------------------------------------- Physician Orders Details Patient Name: Date of Service: [name], Bensen J. 03/29/2020 8:45 A M Medical Record Number: [id number] Patient Account Number: [id number] Date of Birth/Sex: Treating RN: Dec 25, 1950 (69 y.o. 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 Dressing Change Frequency Do not change entire dressing for one week. - both legs Skin Barriers/Peri-Wound Care ntifungal cream - to both feet A Moisturizing lotion - mixed with TCA cream to legs Wound Cleansing May shower with protection. Primary Wound Dressing Wound #168 Right,Posterior Lower Leg Calcium Alginate with Silver Wound #169 Left,Dorsal Foot Calcium Alginate with Silver Wound #170 Left,Distal,Dorsal Foot Calcium Alginate with Silver Wound #171 Left,Medial Malleolus  Calcium Alginate with Silver Secondary Dressing Wound #168 Right,Posterior Lower Leg Dry Gauze ABD pad - as needed Wound #169 Left,Dorsal Foot Dry Gauze ABD pad - as needed Wound #170 Left,Distal,Dorsal Foot Dry Gauze ABD pad - as needed Wound #171 Left,Medial Malleolus Dry Gauze ABD pad - as needed Edema  Control 4 layer compression: Left lower extremity Unna Boot to Right Lower Extremity Avoid standing for long periods of time Elevate legs to the level of the heart or above for 30 minutes daily and/or when sitting, a frequency of: - whenever sitting Exercise regularly Segmental Compressive Device. - lymphadema pumps 60 minutes 2 times per day Other: - If feet or wraps become wet, remove wrap and apply juxtalites Additional Orders / Instructions Other: - take lasix as prescribed by cardiologist Electronic Signature(s) Signed: 03/29/2020 1:28:30 PM By: Lenda Kelp PA-C Signed: 03/29/2020 2:12:16 PM By: Zenaida Deed RN, BSN Entered By: Zenaida Deed on 03/29/2020 09:45:56 -------------------------------------------------------------------------------- Problem List Details Patient Name: Date of Service: [facility], Delfino J. 03/29/2020 8:45 A M Medical Record Number: [id number] Patient Account Number: [id number] Date of Birth/Sex: Treating RN: [birth year] (70 y.o. [name] Primary Care Provider: Marney Setting, [name] Other Clinician: Referring Provider: Treating Provider/Extender: Suzy Bouchard, [name] in Treatment: 218 Active Problems ICD-10 Encounter Code Description Active Date MDM Diagnosis 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 skin 06/30/2018 No Yes L97.221 Non-pressure chronic ulcer of left calf limited to breakdown of skin 09/30/2016 No Yes 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 of 01/22/2016 No Yes bilateral lower extremity I89.0 Lymphedema, not elsewhere classified 01/22/2016 No Yes E11.622 Type 2 diabetes mellitus with other skin ulcer 01/22/2016 No Yes 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, unspecified 01/22/2016 No Yes L03.116 Cellulitis of left lower limb 04/01/2017 No Yes Inactive Problems ICD-10 Code Description Active Date Inactive Date L97.211  Non-pressure chronic ulcer of right calf limited to breakdown of skin 06/30/2017 06/30/2017 L97.521 Non-pressure chronic ulcer of other 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 of skin 04/27/2018 04/27/2018 L03.115 Cellulitis of right lower limb 12/22/2017 12/22/2017 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 06/30/2018 06/30/2018 L97.511 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 of skin 06/30/2018 06/30/2018 Resolved Problems Electronic Signature(s) Signed: 03/29/2020 9:04:44 AM By: Lenda Kelp PA-C Entered By: Lenda Kelp on 03/29/2020 09:04:43 -------------------------------------------------------------------------------- Progress Note Details Patient Name: Date of Service: [name], Neilan J. 03/29/2020 8:45 A M Medical Record Number: [id number] Patient Account Number: [id number] Date of Birth/Sex: Treating RN: [birth year] (69 y.o. [name] Primary Care Provider: Marney Setting, [name] Other Clinician: Referring Provider: Treating Provider/Extender: Lenda Kelp [name], [name] in Treatment: 218 Subjective Chief Complaint Information obtained from Patient patient is here for evaluation venous/lymphedema weeping History of Present Illness (HPI) Referred by PCP for consultation. Patient has long standing history of BLE venous stasis, no prior ulcerations. At beginning of month, developed cellulitis and weeping. Received IM Rocephin followed by Keflex and resolved. Wears compression stocking, appr 6 months old. Not sure strength. No present drainage. 01/22/16 this is a patient who is a type II diabetic on insulin. 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 He tells me he religiously wears pressure stockings of uncertain strength. He was here with weeping edema about 8 months ago but did not have an open wound. Roughly a month ago he had a reopening on  his bilateral legs. He is been  using bandages and Neosporin. He does not complain of pain. 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 Last BUN/creatinine I have is from 11/20/15 at 13 and 1.0 respectively 01/29/16; patient arrives today having tolerated the Profore wrap. He brought in his stockings and these are 18 mmHg stockings he bought from [employer]. The compression here is likely inadequate. He does not complain of pain or excessive drainage she has no systemic symptoms. The wound on the right looks improved as does the one on the left although one on the left is more substantial with still tissue at risk below the actual wound area on the bilateral posterior calf 02/05/16; patient arrives with poor edema control. He states that we did put a 4 layer compression on it last week. No weight appear 5 this. 02/12/16; the area on the posterior right Has healed. The left Has a substantial wound that has necrotic surface eschar that requires a debridement with a curette. 02/16/16;the patient called or a Nurse visit secondary to increased swelling. He had been in earlier in the week with his right leg healed. He was transitioned to is on pressure stocking on the right leg with the only open wound on the left, a substantial area on the left posterior calf. Note he has a history of severe lower extremity edema, he has a history of chronic atrial fibrillation but not heart failure per my notes but I'll need to research this. He is not complaining of chest pain shortness of breath or orthopnea. The intake nurse noted blisters on the previously closed right leg 02/19/16; this is the patient's regular visit day. I see him on Friday with escalating edema new wounds on the right leg and clear signs of at least right ventricular heart failure. I increased his Lasix to 40 twice a day. He is returning currently in follow-up. States he is noticed a  decrease in that the edema 02/26/16 patient's legs have much less edema. There is nothing really open on the right leg. The left leg has improved condition of the large superficial wound on the posterior left leg 03/04/16; edema control is very much better. The patient's right leg wounds have healed. On the left leg he continues to have severe venous inflammation on the posterior aspect of the left leg. There is no tenderness and I don't think any of this is cellulitis. 03/11/16; patient's right leg is married healed and he is in his own stocking. The patient's left leg has deteriorated somewhat. There is a lot of erythema around the wound on the posterior left leg. There is also a significant rim of erythema posteriorly just above where the wrap would've ended there is a new wound in this location and a lot of tenderness. Can't rule out cellulitis in this area. 03/15/16; patient's right leg remains healed and he is in his own stocking. The patient's left leg is much better than last review. His major wound on the posterior aspect of his left Is almost fully epithelialized. He has 3 small injuries from the wraps. Really. Erythema seems a lot better on antibiotics 03/18/16; right leg remains healed and he is in his own stocking. The patient's left leg is much better. The area on the posterior aspect of the left calf is fully epithelialized. His 3 small injuries which were wrap injuries on the left are  improved only one seems still open his erythema has resolved 03/25/16; patient's right leg remains healed and he is in his own stocking. There is no open area today on the left leg posterior leg is completely closed up. His wrap injuries at the superior aspect of his leg are also resolved. He looks as though he has some irritation on the dorsal ankle but this is fully epithelialized without evidence of infection. 03/28/16; we discharged this patient on Monday. Transitioned him into his own stocking. There were  problems almost immediately with uncontrolled swelling weeping edema multiple some of which have opened. He does not feel systemically unwell in particular no chest pain no shortness of breath and he does not feel 04/08/16; the edema is under better control with the Profore light wrap but he still has pitting edema. There is one large wound anteriorly 2 on the medial aspect of his left leg and 3 small areas on the superior posterior calf. Drainage is not excessive he is tolerating a Profore light well 04/15/16; put a Profore wrap on him last week. This is controlled is edema however he had a lot of pain on his left anterior foot most of his wounds are healed 04/22/16 once again the patient has denuded areas on the left anterior foot which he states are because his wrap slips up word. He saw his primary physician today is on Lasix 40 twice a day and states that he his weight is down 20 pounds over the last 3 months. 04/29/16: Much improved. left anterior foot much improved. He is now on Lasix 80 mg per day. Much improved edema control 05/06/16; I was hoping to be able to discharge him today however once again he has blisters at a low level of where the compression was placed last week mostly on his left lateral but also his left medial leg and a small area on the anterior part of the left foot. 05/09/16; apparently the patient went home after his appointment on 7/4 later in the evening developing pain in his upper medial thigh together with subjective fever and chills although his temperature was not taken. The pain was so intense he felt he would probably have to call 911. However he then remembered that he had leftover doxycycline from a previous round of antibiotics and took these. By the next morning he felt a lot better. He called and spoke to one of our nurses and I approved doxycycline over the phone thinking that this was in relation to the wounds we had previously seen although they were definitely  were not. The patient feels a lot better old fever no chills he is still working. Blood sugars are reasonably controlled 05/13/16; patient is back in for review of his cellulitis on his anterior medial upper thigh. He is taking doxycycline this is a lot better. Culture I did of the nodular area on the dorsal aspect of his foot grew MRSA this also looks a lot better. 05/20/16; the patient is cellulitis on the medial upper thigh has resolved. All of his wound areas including the left anterior foot, areas on the medial aspect of the left calf and the lateral aspect of the calf at all resolved. He has a new blister on the left dorsal foot at the level of the fourth toe this was excised. No evidence of infection 05/27/16; patient continues to complain weeping edema. He has new blisterlike wounds on the left anterior lateral and posterior lateral calf at the top of his  wrap levels. The area on his left anterior foot appears better. He is not complaining of fever, pain or pruritus in his feet. 05/30/16; the patient's blisters on his left anterior leg posterior calf all look improved. He did not increase the Lasix 100 mg as I suggested because he was going to run out of his 40 mg tablets. He is still having weeping edema of his toes 06/03/16; I renewed his Lasix at 80 mg once a day as he was about to run out when I last saw him. He is on 80 mg of Lasix now. I have asked him to cut down on the excessive amount of water he was drinking and asked him to drink according to his thirst mechanisms 06/12/2016 -- was seen 2 days ago and was supposed to wear his compression stockings at home but he is developed lymphedema and superficial blisters on the left lower extremity and hence came in for a review 06/24/16; the remaining wound is on his left anterior leg. He still has edema coming from between his toes. There is lymphedema here however his edema is generally better than when I last saw this. He has a history of  atrial fibrillation but does not have a known history of congestive heart failure nevertheless I think he probably has this at least on a diastolic basis. 07/01/16 I reviewed his echocardiogram from January 2017. This was essentially normal. He did not have LVH, EF of 55-60%. His right ventricular function was normal although he did have trivial tricuspid and pulmonic regurgitation. This is not audible on exam however. I increased his Lasix to do massive edema in his legs well above his knees I think in early July. He was also drinking an excessive amount of water at the time. 07/15/16; missed his appointment last week because of the Labor Day holiday on Monday. He could not get another appointment later in the week. Started to feel the wrap digging in superiorly so we remove the top half and the bottom half of his wrap. He has extensive erythema and blistering superiorly in the left leg. Very tender. Very swollen. Edema in his foot with leaking edema fluid. He has not been systemically unwell 07/22/16; the area on the left leg laterally required some debridement. The medial wounds look more stable. His wrap injury wounds appear to have healed. Edema and his foot is better, weeping edema is also better. He tells me he is meeting with the supplier of the external compression pumps at work 08/05/16; the patient was on vacation last week in [facility]. His wrap is been on for an extended period of time. Also over the weekend he developed an extensive area of tender erythema across his anterior medial thigh. He took to doxycycline yesterday that he had leftover from a previous prescription. The patient complains of weeping edema coming out of his toes 08/08/16; I saw this patient on 10/2. He was tender across his anterior thigh. I put him on doxycycline. He returns today in follow-up. He does not have any open wounds on his lower leg, he still has edema weeping into his toes. 08/12/16; patient was seen back  urgently today to follow-up for his extensive left thigh cellulitis/erysipelas. He comes back with a lot less swelling and erythema pain is much better. I believe I gave him Augmentin and Cipro. His wrap was cut down as he stated a roll down his legs. He developed blistering above the level of the wrap that remained. He has  2 open blisters and 1 intact. 08/19/16; patient is been doing his primary doctor who is increased his Lasix from 40-80 once a day or 80 already has less edema. Cellulitis has remained improved in the left thigh. 2 open areas on the posterior left calf 08/26/16; he returns today having new open blisters on the anterior part of his left leg. He has his compression pumps but is not yet been shown how to use some vital representative from the supplier. 09/02/16 patient returns today with no open wounds on the left leg. Some maceration in his plantar toes 09/10/2016 -- Dr. Leanord Hawking had recently discharged him on 09/02/2016 and he has come right back with redness swelling and some open ulcers on his left lower extremity. He says this was caused by trying to apply his compression stockings and he's been unable to use this and has not been able to use his lymphedema pumps. He had some doxycycline leftover and he has started on this a few days ago. 09/16/16; there are no open wounds on his leg on the left and no evidence of cellulitis. 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 He does not complain of symptoms here. I am not clear use using his external compression pumps. 09/23/16; I have not seen this patient in 2 weeks. He canceled his appointment 10 days ago as he was going on vacation. He tells me that on Monday he noticed a large area on his posterior left leg which is been draining copiously and is reopened into a large wound. He is been using ABDs and the external part of his juxtalite, according to our nurse this was not on  properly. 10/07/16; Still a substantial area on the posterior left leg. Using silver alginate 10/14/16; in general better although there is still open area which looks healthy. Still using silver alginate. He reminds me that this happen before he left for [facility]. T oday while he was showering in the morning. He had been using his juxtalite's 10/21/16; the area on his posterior left leg is fully epithelialized. However he arrives today with a large area of tender erythema in his medial and posterior left thigh just above the knee. I have marked the area. Once again he is reluctant to consider hospitalization. I treated him with oral antibiotics in the past for a similar situation with resolution I think with doxycycline however this area it seems more extensive to me. He is not complaining of fever but does have chills and says states he is thirsty. His blood sugar today was in the 140s at home 10/25/16 the area on his posterior left leg is fully epithelialized although there is still some weeping edema. The large area of tenderness and erythema in his medial and posterior left thigh is a lot less tender although there is still a lot of swelling in this thigh. He states he feels a lot better. He is on doxycycline and Augmentin that I started last week. This will continued until Tuesday, December 26. I have ordered a duplex ultrasound of the left thigh rule out DVT whether there is an abscess something that would need to be drained I would also like to know. 11/01/16; he still has weeping edema from a not fully epithelialized area on his left posterior calf. Most of the rest of this looks a lot better. He has completed his antibiotics. His thigh is a lot better. Duplex ultrasound did not show a  DVT in the thigh 11/08/16; he comes in today with more Denuded surface epithelium from the posterior aspect of his calf. There is no real evidence of cellulitis. The superior aspect of his wrap appears to have  put quite an indentation in his leg just below the knee and this may have contributed. He does not complain of pain or fever. We have been using silver alginate as the primary dressing. The area of cellulitis in the right thigh has totally resolved. He has been using his compression stockings once a week 11/15/16; the patient arrives today with more loss of epithelium from the posterior aspect of his left calf. He now has a fairly substantial wound in this area. The reason behind this deterioration isn't exactly clear although his edema is not well controlled. He states he feels he is generally more swollen systemically. He is not complaining of chest pain shortness of breath fever. T me he has an appointment with his primary physician in early February. He is on 80 mg of oral ells Lasix a day. He claims compliance with the external compression pumps. He is not having any pain in his legs similar to what he has with his recurrent cellulitis 11/22/16; the patient arrives a follow-up of his large area on his left lateral calf. This looks somewhat better today. He came in earlier in the week for a dressing change since I saw him a week ago. He is not complaining of any pain no shortness of breath no chest pain 11/28/16; the patient arrives for follow-up of his large area on the left lateral calf this does not look better. In fact it is larger weeping edema. The surface of the wound does not look too bad. We have been using silver alginate although I'm not certain that this is a dressing issue. 12/05/16; again the patient follows up for a large wound on the left lateral and left posterior calf this does not look better. There continues to be weeping edema necrotic surface tissue. More worrisome than this once again there is erythema below the wound involving the distal Achilles and heel suggestive of cellulitis. He is on his feet working most of the day of this is not going well. We are changing his dressing  twice a week to facilitate the drainage. 12/12/16; not much change in the overall dimensions of the large area on the left posterior calf. This is very inflamed looking. I gave him an. Doxycycline last week does not really seem to have helped. He found the wrap very painful indeed it seems to of dog into his legs superiorly and perhaps around the heel. He came in early today because the drainage had soaked through his dressings. 12/19/16- patient arrives for follow-up evaluation of his left lower extremity ulcers. He states that he is using his lymphedema pumps once daily when there is "no drainage". He admits to not using his lipedema pumps while under current treatment. His blood sugars have been consistently between 150-200. 12/26/16; the patient is not using his compression pumps at home because of the wetness on his feet. I've advised him that I think it's important for him to use this daily. He finds his feet too wet, he can put a plastic bag over his legs while he is in the pumps. Otherwise I think will be in a vicious circle. We are using silver alginate to the major area on his left posterior calf 01/02/17; the patient's posterior left leg has further of all into 3  open wounds. All of them covered with a necrotic surface. He claims to be using his compression pumps once a day. His edema control is marginal. Continue with silver alginate 01/10/17; the patient's left posterior leg actually looks somewhat better. There is less edema, less erythema. Still has 3 open areas covered with a necrotic surface requiring debridement. He claims to be using his compression pumps once a day his edema control is better 01/17/17; the patient's left posterior calf look better last week when I saw him and his wrap was changed 2 days ago. He has noted increasing pain in the left heel and arrives today with much larger wounds extensive erythema extending down into the entire heel area especially tender medially. He is not  systemically unwell CBGs have been controlled no fever. Our intake nurse showed me limegreen drainage on his AVD pads. 01/24/17; his usual this patient responds nicely to antibiotics last week giving him Levaquin for presumed Pseudomonas. The whole entire posterior part of his leg is much better much less inflamed and in the case of his Achilles heel area much less tender. He has also had some epithelialization posteriorly there are still open areas here and still draining but overall considerably better 01/31/17- He has continue to tolerate the compression wraps. he states that he continues to use the lymphedema pumps daily, and can increase to twice daily on the weekends. He is voicing no complaints or concerns regarding his LLE ulcers 02/07/17-he is here for follow-up evaluation. He states that he noted some erythema to the left medial and anterior thigh, which he states is new as of yesterday. He is concerned about recurrent cellulitis. He states his blood sugars have been slightly elevated, this morning in the 180s 02/14/17; he is here for follow-up evaluation. When he was last here there was erythema superiorly from his posterior wound in his anterior thigh. He was prescribed Levaquin however a culture of the wound surface grew MRSA over the phone I changed him to doxycycline on Monday and things seem to be a lot better. 02/24/17; patient missed his appointment on Friday therefore we changed his nurse visit into a physician visit today. Still using silver alginate on the large area of the posterior left thigh. He isn't new area on the dorsal left second toe 03/03/17; actually better today although he admits he has not used his external compression pumps in the last 2 days or so because of work responsibilities over the weekend. 03/10/17; continued improvement. External compression pumps once a day almost all of his wounds have closed on the posterior left calf. Better edema control 03/17/17; in general  improved. He still has 3 small open areas on the lateral aspect of his left leg however most of the area on the posterior part of his leg is epithelialized. He has better edema control. He has an ABD pad under his stocking on the right anterior lower leg although he did not let us look at that today. 03/24/17; patient arrives back in clinic today with no open areas however there are areas on the posterior left calf and anterior left calf that are less than 100% epithelialized. His edema is well controlled in the left lower leg. There is some pitting edema probably lymphedema in the left upper thigh. He uses compression pumps at home once per day. I tried to get him to do this twice a day although he is very reticent. 04/01/2017 -- for the last 2 days he's had significant redness, tenderness and  weeping and came in for an urgent visit today. 04/07/17; patient still has 6 more days of doxycycline. He was seen by Dr. Meyer Russel last Wednesday for cellulitis involving the posterior aspect, lateral aspect of his Involving his heel. For the most part he is better there is less erythema and less weeping. He has been on his feet for 12 hours o2 over the weekend. Using his compression pumps once a day 04/14/17 arrives today with continued improvement. Only one area on the posterior left calf that is not fully epithelialized. He has intense bilateral venous inflammation associated with his chronic venous insufficiency disease and secondary lymphedema. We have been using silver alginate to the left posterior calf wound In passing he tells [place] today that the right leg but we have not seen in quite some time has an open area on it but he doesn't want [place] to look at this today states he will show this to [place] next week. 04/21/17; there is no open area on his left leg although he still reports some weeping edema. He showed [place] his right leg today which is the first time we've seen this leg in a long time. He has a large area of  open wound on the right leg anteriorly healthy granulation. Quite a bit of swelling in the right leg and some degree of venous inflammation. He told us about the right leg in passing last week but states that deterioration in the right leg really only happened over the weekend 04/28/17; there is no open area on the left leg although there is an irritated part on the posterior which is like a wrap injury. The wound on the right leg which was new from last week at least to [place] is a lot better. 05/05/17; still no open area on the left leg. Patient is using his new compression stocking which seems to be doing a good job of controlling the edema. He states he is using his compression pumps once per day. The right leg still has an open wound although it is better in terms of surface area. Required debridement. A lot of pain in the posterior right Achilles marked tenderness. Usually this type of presentation this patient gives concern for an active cellulitis 05/12/17; patient arrives today with his major wound from last week on the right lateral leg somewhat better. Still requiring debridement. He was using his compression stocking on the left leg however that is reopened with superficial wounds anteriorly he did not have an open wound on this leg previously. He is still using his juxta light's once daily at night. He cannot find the time to do this in the morning as he has to be at work by 7 AM 05/19/17; right lateral leg wound looks improved. No debridement required. The concerning area is on the left posterior leg which appears to almost have a subcutaneous hemorrhagic component to it. We've been using silver alginate to all the wounds 05/26/17; the right lateral leg wound continues to look improved. However the area on the left posterior calf is a tightly adherent surface. [place] using silver alginate. Because of the weeping edema in his legs there is very little good alternatives. 06/02/17; the patient left  here last week looking quite good. Major wound on the left posterior calf and a small one on the right lateral calf. Both of these look satisfactory. He tells me that by Wednesday he had noted increased pain in the left leg and drainage. He called on Thursday and  Friday to get an appointment here but we were blocked. He did not go to urgent care or his primary physician. He thinks he had a fever on Thursday but did not actually take his temperature. He has not been using his compression pumps on the left leg because of pain. I advised him to go to the emergency room today for IV antibiotics for stents of left leg cellulitis but he has refused I have asked him to take 2 days off work to keep his leg elevated and he has refused this as well. In view of this I'm going to call him and Augmentin and doxycycline. 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 However after a detailed discussion regarding the risk benefits and alternatives he refuses to get admitted to the hospital. With no other recourse I will continue him on oral antibiotics as before and hopefully he'll have his infectious disease consultation this week. 06/16/2017 -- the patient was seen today by the nurse practitioner at infectious disease Ms. Dixon. 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 He has also been advise Lotrimin cream locally. she also had wise IV antibiotics for his condition if it flares up 06/23/17; patient arrives today with drainage bilaterally although the remaining wound on the left posterior calf after  cleaning up today "highlighter yellow drainage" did not look too bad. Unfortunately he has had breakdown on the right anterior leg [previously this leg had not been open and he is using a black stocking] he went to see infectious disease and is been put on clindamycin 150 mg daily, I did not verify the dose although I'm not familiar with using clindamycin in this dosing range, perhaps for prophylaxisoo 06/27/17; I brought this patient back today to follow-up on the wound deterioration on the right lower leg together with surrounding cellulitis. I started him on doxycycline 4 days ago. This area looks better however he comes in today with intense cellulitis on the medial part of his left thigh. This is not have a wound in this area. Extremely tender. We've been using silver alginate to the wounds on the right lower leg left lower leg with bilateral 4 layer compression he is using his external compression pumps once a day 07/04/17; patient's left medial thigh cellulitis looks better. He has not been using his compression pumps as his insert said it was contraindicated with cellulitis. His right leg continues to make improvements all the wounds are still open. We only have one remaining wound on the left posterior calf. Using silver alginate to all open areas. He is on doxycycline which I started a week ago and should be finishing I gave him Augmentin after Thursday's visit for the severe cellulitis on the left medial thigh which fortunately looks better 07/14/17; the patient's left medial thigh cellulitis has resolved. The cellulitis in his right lower calf on the right also looks better. All of his wounds are stable to improved we've been using silver alginate he has completed the antibiotics I have given him. He has clindamycin 150 mg once a day  prescribed by infectious disease for prophylaxis, I've advised him to start this now. We have been using bilateral Unna boots over silver alginate to the wound  areas 07/21/17; the patient is been to see infectious disease who noted his recurrent problems with cellulitis. He was not able to tolerate prophylactic clindamycin therefore he is on amoxicillin 500 twice a day. He also had a second daily dose of Lasix added By Dr. Oneta Rack but he is not taking this. Nor is he being completely compliant with his compression pumps a especially not this week. He has 2 remaining wounds one on the right posterior lateral lower leg and one on the left posterior medial lower leg. 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 The patient has Unna boots bilaterally. 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 He has bilateral Unna boots for compression and silver alginate to his wounds. Arrives today with his legs looking as good as I have seen him in quite some time. Not surprisingly his wounds look better as well with improvement on the right lateral leg venous insufficiency wound and also the left medial leg. He is still using the compression pumps once a day 08/11/17; both legs appear to be doing better wounds on the right lateral and left medial legs look better. Skin on the right leg quite good. He is been using silver alginate as the primary dressing. I'm going to use Anasept gel calcium alginate and maintain all the secondary dressings 08/18/17; the patient continues to actually do quite well. The area on his right lateral leg is just about closed the left medial also looks better although it is still moist in this area. His edema is well controlled we have been using Anasept gel with calcium alginate and the usual secondary dressings, 4 layer compression and once daily use of his compression pumps "always been able to manage 09/01/17; the patient  continues to do reasonably well in spite of his trip to [place]. The area on the right lateral leg is epithelialized. Left is much better but still open. He has more edema and more chronic erythema on the left leg [venous inflammation] 09/08/17; he arrives today with no open wound on the right lateral leg and decently controlled edema. Unfortunately his left leg is not nearly as in his good situation as last week.he apparently had increasing edema starting on Saturday. He edema soaked through into his foot so used a plastic bag to walk around his home. The area on the medial right leg which was his open area is about the same however he has lost surface epithelium on the left lateral which is new and he has significant pain in the Achilles area of the left foot. He is already on amoxicillin chronically for prophylaxis of cellulitis in the left leg 09/15/17; he is completed a week of doxycycline and the cellulitis in the left posterior leg and Achilles area is as usual improved. He still has a lot of edema and fluid soaking through his dressings. There is no open wound on the right leg. He saw infectious disease NP today 09/22/17;As usual 1 we transition him from our compression wraps to his stockings things did not go well. He has several small open areas on the right leg. He states this was caused  by the compression wrap on his skin although he did not wear this with the stockings over them. He has several superficial areas on the left leg medially laterally posteriorly. He does not have any evidence of active cellulitis especially involving the left Achilles The patient is traveling from [facility] Saturday going to [facility]. He states he isn't attempting to get an appointment with a heel objects wound center there to change his dressings. I am not completely certain whether this will work 10/06/17; the patient came in on Friday for a nurse visit and the nurse reported that his legs actually look  quite good. He arrives in clinic today for his regular follow-up visit. He has a new wound on his left third toe over the PIP probably caused by friction with his footwear. He has small areas on the left leg and a very superficial but epithelialized area on the right anterior lateral lower leg. Other than that his legs look as good as I've seen him in quite some time. We have been using silver alginate Review of systems; no chest pain no shortness of breath other than this a 10 point review of systems negative 10/20/17; seen by Dr. Meyer Russel last week. He had taken some antibiotics [doxycycline] that he had left over. Dr. Meyer Russel thought he had candida infection and declined to give him further antibiotics. He has a small wound remaining on the right lateral leg several areas on the left leg including a larger area on the left posterior several left medial and anterior and a small wound on the left lateral. The area on the left dorsal third toe looks a lot better. ROS; Gen.; no fever, respiratory no cough no sputum Cardiac no chest pain other than this 10 point review of system is negative 10/30/17; patient arrives today having fallen in the bathtub 3 days ago. It took him a while to get up. He has pain and maceration in the wounds on his left leg which have deteriorated. He has not been using his pumps he also has some maceration on the right lateral leg. 11/03/17; patient continues to have weeping edema especially in the left leg. This saturates his dressings which were just put on on 12/27. As usual the doxycycline seems to take care of the cellulitis on his lower leg. He is not complaining of fever, chills, or other systemic symptoms. He states his leg feels a lot better on the doxycycline I gave him empirically. He also apparently gets injections at his primary doctor's officeo Rocephin for cellulitis prophylaxis. I didn't ask him about his compression pump compliance today I think that's probably  marginal. Arrives in the clinic with all of his dressings primary and secondary macerated full of fluid and he has bilateral edema 11/10/17; the patient's right leg looks some better although there is still a cluster of wounds on the right lateral. The left leg is inflamed with almost circumferential skin loss medially to laterally although we are still maintaining anteriorly. He does not have overt cellulitis there is a lot of drainage. He is not using compression pumps. We have been using silver alginate to the wound areas, there are not a lot of options here 11/17/17; the patient's right leg continues to be stable although there is still open wounds, better than last week. The inflammation in the left leg is better. Still loss of surface layer epithelium especially posteriorly. There is no overt cellulitis in the amount of edema and his left leg is really quite  good, tells me he is using his compression pumps once a day. 11/24/17; patient's right leg has a small superficial wound laterally this continues to improve. The inflammation in the left leg is still improving however we have continuous surface layer epithelial loss posteriorly. There is no overt cellulitis in the amount of edema in both legs is really quite good. He states he is using his compression pumps on the left leg once a day for 5 out of 7 days 12/01/17; very small superficial areas on the right lateral leg continue to improve. Edema control in both legs is better today. He has continued loss of surface epithelialization and left posterior calf although I think this is better. We have been using silver alginate with large number of absorptive secondary dressings 4 layer on the left Unna boot on the right at his request. He tells me he is using his compression pumps once a day 12/08/17; he has no open area on the right leg is edema control is good here. ooOn the left leg however he has marked erythema and tenderness breakdown of skin. He  has what appears to be a wrap injury just distal to the popliteal fossa. 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 There was swelling erythema and breakdown in the right leg. He has much worse skin breakdown in the left leg as well multiple open areas medially and posteriorly as well as laterally. He tells me he has been using his compression pumps but tells me he feels that the drainage out of his leg is worse when he uses a compression pumps. T be fair to him he is been saying this o for a while however I don't know that I have really been listening to this. I wonder if the compression pumps are working properly 12/22/17;. Once again he arrives with severe erythema, weeping edema from the left greater than right leg. Noncompliance with compression pumps. New this visit he is complaining of pain on the lateral aspect of the right leg and the medial aspect of his right thigh. He apparently saw his cardiologist Dr. [name] who was ordered an echocardiogram area and I think this is a step in the right direction 12/25/17; started his doxycycline Monday night. There is still intense erythema of the right leg especially in the anterior thigh although there is less tenderness. The erythema around the wound on the right lateral calf also is less tender. He still complaining of pain in the left heel. His wounds are about the same right lateral left medial left lateral. Superficial but certainly not close to closure. He denies being systemically unwell no fever chills no abdominal pain no diarrhea 12/29/17; back in follow-up of his extensive right calf and right thigh cellulitis. I added amoxicillin to cover possible doxycycline resistant  strep. This seems to of done the trick he is in much less pain there is much less erythema and swelling. He has his echocardiogram at 11:00 this morning. X-ray of the left heel was also negative. 01/05/18; the patient arrived with his edema under much better control. Now that he is retired he is able to use his compression pumps daily and sometimes twice a day per the patient. He has a wound  on the right leg the lateral wound looks better. Area on the left leg also looks a lot better. He has no evidence of cellulitis in his bilateral thighs I had a quick peak at his echocardiogram. He is in normal ejection fraction and normal left ventricular function. He has moderate pulmonary hypertension moderately reduced right ventricular function. One would have to wonder about chronic sleep apnea although he says he doesn't snore. He'll review the echocardiogram with his cardiologist. 01/12/18; the patient arrives with the edema in both legs under exemplary control. He is using his compression pumps daily and sometimes twice daily. His wound on the right lateral leg is just about closed. He still has some weeping areas on the posterior left calf and lateral left calf although everything is just about closed here as well. I have spoken with Aldean Baker who is the patient's nurse practitioner and infectious disease. She was concerned that the patient had not understood that the parenteral penicillin injections he was receiving for cellulitis prophylaxis was actually benefiting him. I don't think the patient actually saw that I would tend to agree we were certainly dealing with less infections although he had a serious one last month. 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 He is healed. He'll be placed in juxtalite compression wraps and increase his lymphedema pumps to twice daily. We will follow up again next week to ensure there are no issues with the new  regiment. 01/20/18-he is here for evaluation of bilateral lower extremity weeping edema. Yesterday he was placed in compression wrap to the right lower extremity and compression stocking to left lower shrubbery. He states he uses lymphedema pumps last night and again this morning and noted a blister to the left lower extremity. On exam he was noted to have drainage to the right lower extremity. He will be placed in Unna boots bilaterally and follow-up next week 01/26/18; patient was actually discharged a week ago to his own juxta light stockings only to return the next day with bilateral lower extremity weeping edema.he was placed in bilateral Unna boots. He arrives today with pain in the back of his left leg. There is no open area on the right leg however there is a linear/wrap injury on the left leg and weeping edema on the left leg posteriorly. I spoke with infectious disease about 10 days ago. They were disappointed that the patient elected to discontinue prophylactic intramuscular penicillin shots as they felt it was particularly beneficial in reducing the frequency of his cellulitis. I discussed this with the patient today. He does not share this view. He'll definitely need antibiotics today. Finally he is traveling to [place] and trauma leaving this Saturday and returning a week later and he does not travel with his pumps. He is going by car 01/30/18; patient was seen 4 days ago and brought back in today for review of cellulitis in the left leg posteriorly. I put him on amoxicillin this really hasn't helped as much as I might like. He is also worried because he is traveling to [facility] trauma by car. Finally we will be rewrapping him. There is no open area on the right leg over his left leg has multiple weeping areas as usual 02/09/18; The same wrap on for 10 days. He did not pick up the last doxycycline I prescribed for him. He apparently took 4 days worth he already had. There is nothing open on  his right leg and the edema control  is really quite good. He's had damage in the left leg medially and laterally especially probably related to the prolonged use of Unna boots 02/12/18; the patient arrived in clinic today for a nurse visit/wrap change. He complained of a lot of pain in the left posterior calf. He is taking doxycycline that I previously prescribed for him. Unfortunately even though he used his stockings and apparently used to compression pumps twice a day he has weeping edema coming out of the lateral part of his right leg. This is coming from the lower anterior lateral skin area. 02/16/18; the patient has finished his doxycycline and will finish the amoxicillin 2 days. The area of cellulitis in the left calf posteriorly has resolved. He is no longer having any pain. He tells me he is using his compression pumps at least once a day sometimes twice. 02/23/18; the patient finished his doxycycline and Amoxil last week. On Friday he noticed a small erythematous circle about the size of a quarter on the left lower leg just above his ankle. This rapidly expanded and he now has erythema on the lateral and posterior part of the thigh. This is bright red. Also has an area on the dorsal foot just above his toes and a tender area just below the left popliteal fossa. He came off his prophylactic penicillin injections at his own insistence one or 2 months ago. This is obviously deteriorated since then 03/02/18; patient is on doxycycline and Amoxil. Culture I did last week of the weeping area on the back of his left calf grew group B strep. I have therefore renewed the amoxicillin 500 3 times a day for a further week. He has not been systemically unwell. Still complaining of an area of discomfort right under his left popliteal fossa. There is no open wound on the right leg. He tells me that he is using his pumps twice a day on most days 03/09/18; patient arrives in clinic today completing his amoxicillin  today. The cellulitis on his left leg is better. Furthermore he tells me that he had intramuscular penicillin shots that his primary care office today. However he also states that the wrap on his right leg fell down shortly after leaving clinic last week. He developed a large blister that was present when he came in for a nurse visit later in the week and then he developed intense discomfort around this area.He tells me he is using his compression pumps 03/16/18; the patient has completed his doxycycline. The infectious part of this/cellulitis in the left heel area left popliteal area is a lot better. He has 2 open areas on the right calf. Still areas on the left calf but this is a lot better as well. 03/24/18; the patient arrives complaining of pain in the left popliteal area again. He thinks some of this is wrap injury. He has no open area on the right leg and really no open area on the left calf either except for the popliteal area. He claims to be compliant with the compression pumps 03/31/18; I gave him doxycycline last week because of cellulitis in the left popliteal area. This is a lot better although the surface epithelium is denuded off and response to this. He arrives today with uncontrolled edema in the right calf area as well as a fingernail injury in the right lateral calf. There is only a few open areas on the left 04/06/18; I gave him amoxicillin doxycycline over the last 2 weeks that the amoxicillin should be  completing currently. He is not complaining of any pain or systemic symptoms. The only open areas see has is on the right lateral lower leg paradoxically I cannot see anything on the left lower leg. He tells me he is using his compression pumps twice a day on most days. Silver alginate to the wounds that are open under 4 layer compression 04/13/18; he completed antibiotics and has no new complaints. Using his compression pumps. Silver alginate that anything that's opened 04/20/18; he is  using his compression pumps religiously. Silver alginate 4 layer compression anything that's opened. He comes in today with no open wounds on the left leg but 3 on the right including a new one posteriorly. He has 2 on the right lateral and one on the right posterior. He likes Unna boots on the right leg for reasons that aren't really clear we had the usual 4 layer compression on the left. It may be necessary to move to the 4 layer compression on the right however for now I left them in the Unna boots 04/27/18; he is using his compression pumps at least once a day. He has still the wounds on the right lateral calf. The area right posteriorly has closed. He does not have an open wound on the left under 4 layer compression however on the dorsal left foot just proximal to the toes and the left third toe 2 small open areas were identified 05/11/18; he has not uses compression pumps. The areas on the right lateral calf have coalesced into one large wound necrotic surface. On the left side he has one small wound anteriorly however the edema is now weeping out of a large part of his left leg. He says he wasn't using his pumps because of the weeping fluid. I explained to him that this is the time he needs to pump more 05/18/18; patient states he is using his compression pumps twice a day. The area on the right lateral large wound albeit superficial. On the left side he has innumerable number of small new wounds on the left calf particularly laterally but several anteriorly and medially. All these appear to have healthy granulated base these look like the remnants of blisters however they occurred under compression. The patient arrives in clinic today with his legs somewhat better. There is certainly less edema, less multiple open areas on the left calf and the right anterior leg looks somewhat better as well superficial and a little smaller. However he relates pain and erythema over the last 3-4 days in the thigh  and I looked at this today. He has not been systemically unwell no fever no chills no change in blood sugar values 05/25/18; comes in today in a better state. The severe cellulitis on his left leg seems better with the Keflex. Not as tender. He has not been systemically unwell ooHard to find an open wound on the left lower leg using his compression pumps twice a day ooThe confluent wounds on his right lateral calf somewhat better looking. These will ultimately need debridement I didn't do this today. 06/01/18; the severe cellulitis on the left anterior thigh has resolved and he is completed his Keflex. ooThere is no open wound on the left leg however there is a superficial excoriation at the base of the third toe dorsally. Skin on the bottom of his left foot is macerated looking. ooThe left the wounds on the lateral right leg actually looks some better although he did require debridement of the top half  of this wound area with an open curet 06/09/18 on evaluation today patient appears to be doing poorly in regard to his right lower extremity in particular this appears to likely be infected he has very thick purulent discharge along with a bright green tent to the discharge. This makes me concerned about the possibility of pseudomonas. He's also having increased discomfort at this point on evaluation. Fortunately there does not appear to be any evidence of infection spreading to the other location at this time. 06/16/18 on evaluation today patient appears to actually be doing fairly well. His ulcer has actually diminished in size quite significantly at this point which is good news. Nonetheless he still does have some evidence of infection he did see infectious disease this morning before coming here for his appointment. I did review the results of their evaluation and their note today. They did actually have him discontinue the Cipro and initiate treatment with linezolid at this time. He is doing this  for the next seven days and they recommended a follow-up in four months with them. He is the keep a log of the need for intermittent antibiotic therapy between now and when he falls back up with infectious disease. This will help them gaze what exactly they need to do to try and help them out. 06/23/18; the patient arrives today with no open wounds on the left leg and left third toe healed. He is been using his compression pumps twice a day. On the right lateral leg he still has a sizable wound but this is a lot better than last time I saw this. 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 Has been using silver alginate under 4 layer compression 06/30/18; the only open wound he has is on the right lateral leg and this looks healthy. No debridement is required. We have been using silver alginate. He does not have an open wound on the left leg. There is apparently some drainage from the dorsal proximal third toe on the left although I see no open wound here. 07/03/18 on evaluation today patient was actually here just for a nurse visit rapid change. However when he was here on Wednesday for his rat change due to having been healed on the left and then developing blisters we initiated the wrap again knowing that he would be back today for [place] to reevaluate and see were at. Unfortunately he has developed some cellulitis into the proximal portion of his right lower extremity even into the region of his thigh. 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 He was placed on one as what at that point. Nonetheless he is done with that and has been tolerating it well otherwise. Doxycycline which in the past really did not seem to be effective for him. Nonetheless I think the best option may be for [place] to definitely reinitiate the antibiotics for a longer period of time. 07/07/18;  since I last saw this patient a week ago he has had a difficult time. At that point he did not have an open wound on his left leg. We transitioned him into juxta light stockings. He was apparently in the clinic the next day with blisters on the left lateral and left medial lower calf. He also had weeping edema fluid. He was put back into a compression wrap. He was also in the  clinic on Friday with intense erythema in his right thigh. Per the patient he was started on Bactrim however that didn't work at all in terms of relieving his pain and swelling. He has taken 3 doxycycline that he had left over from last time and that seems to of helped. He has blistering on the right thigh as well. 07/14/18; the erythema on his right thigh has gotten better with doxycycline that he is finishing. 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 Presumably this cellulitis in the thigh was not related to that although I think this is a bit concerning going forward. He still has an area on the right lateral calf the blister on the right medial calf just below the knee that was discussed above. On the left 2 small open areas left medial and left lateral. Edema control is adequate. He is using his compression pumps twice a day 07/20/18; continued improvement in the condition of both legs especially the edema in his bilateral thighs. He tells me he is been losing weight through a combination of diet and exercise. He is using his compression pumps twice a day. So overall she made to the remaining wounds 07/27/2018; continued improvement in condition of both legs. His edema is well controlled. The area on the right lateral leg is just about closed he had one blisters show up on the medial left upper calf. We have him in 4 layer compression. He is going on a 10-day trip to [place], [place] and [place]. He will be driving. He wants to wear Unna boots  because of the lessening amount of constriction. He will not use compression pumps while he is away 08/05/18 on evaluation today patient actually appears to be doing decently well all things considered in regard to his bilateral lower extremities. The worst ulcer is actually only posterior aspect of his left lower extremity with a four layer compression wrap cut into his leg a couple weeks back. He did have a trip and actually had [employer] for the trip that he is worn since he was last here. Nonetheless he feels like the [employer] actually do better for him his swelling is up a little bit but he also with his trip was not taking his Lasix on a regular set schedule like he was supposed to be. He states that obviously the reason being that he cannot drive and keep going without having to urinate too frequently which makes it difficult. He did not have his pumps with him while he was away either which I think also maybe playing a role here too. 08/13/2018; the patient only has a small open wound on the right lateral calf which is a big improvement in the last month or 2. He also has the area posteriorly just below the posterior fossa on the left which I think was a wrap injury from several weeks ago. He has no current evidence of cellulitis. He tells me he is back into his compression pumps twice a day. He also tells me that while he was at the laundromat somebody stole a section of his extremitease stockings 08/20/2018; back in the clinic with a much improved state. He only has small areas on the right lateral mid calf which is just about healed. This was is more substantial area for quite a prolonged period of time. He has a small open area on the left  anterior tibia. The area on the posterior calf just below the popliteal fossa is closed today. He is using his compression pumps twice a day 08/28/2018; patient has no open wound on the right leg. He has a smattering of open areas on the calf  with some weeping lymphedema. More problematically than that it looks as though his wraps of slipped down in his usual he has very angry upper area of edema just below the right medial knee and on the right lateral calf. He has no open area on his feet. The patient is traveling to [facility] next week. I will send him in an antibiotic. We will continue to wrap the right leg. We ordered extremitease stockings for him last week and I plan to transition the right leg to a stocking when he gets home which will be in 10 days time. As usual he is very reluctant to take his pumps with him when he travels 09/07/2018; patient returns from [facility]. He shows me a picture of his left leg in the mid part of his trip last week with intense fire engine erythema. The picture look bad enough I would have considered sending him to the hospital. Instead he went to the wound care center in [facility]. They did not prescribe him antibiotics but he did take some doxycycline he had leftover from a previous visit. I had given him trimethoprim sulfamethoxazole before he left this did not work according to the patient. This is resulted in some improvement fortunately. He comes back with a large wound on the left posterior calf. Smaller area on the left anterior tibia. Denuded blisters on the dorsal left foot over his toes. Does not have much in the way of wounds on the right leg although he does have a very tender area on the right posterior area just below the popliteal fossa also suggestive of infection. He promises me he is back on his pumps twice a day 09/15/2018; the intense cellulitis in his left lower calf is a lot better. The wound area on the posterior left calf is also so better. However he has reasonably extensive wounds on the dorsal aspect of his second and third toes and the proximal foot just at the base of the toes. There is nothing open on the right leg 09/22/2018; the patient has excellent  edema control in his legs bilaterally. He is using his external compression pumps twice a day. He has no open area on the right leg and only the areas in the left foot dorsally second and third toe area on the left side. He does not have any signs of active cellulitis. 10/06/2018; the patient has good edema control bilaterally. He has no open wound on the right leg. There is a blister in the posterior aspect of his left calf that we had to deal with today. He is using his compression pumps twice a day. There is no signs of active cellulitis. We have been using silver alginate to the wound areas. He still has vulnerable areas on the base of his left first second toes dorsally He has a his extremities stockings and we are going to transition him today into the stocking on the right leg. He is cautioned that he will need to continue to use the compression pumps twice a day. If he notices uncontrolled edema in the right leg he may need to go to 3 times a day. 10/13/2018; the patient came in for a nurse check on Friday  he has a large flaccid blister on the right medial calf just below the knee. We unroofed this. He has this and a new area underneath the posterior mid calf which was undoubtedly a blister as well. He also has several small areas on the right which is the area we put his extremities stocking on. 10/19/2018; the patient went to see infectious disease this morning I am not sure if that was a routine follow-up in any case the doxycycline I had given him was discontinued and started on linezolid. He has not started this. It is easy to look at his left calf and the inflammation and think this is cellulitis however he is very tender in the tissue just below the popliteal fossa and I have no doubt that there is infection going on here. He states the problem he is having is that with the compression pumps the edema goes down and then starts walking the wrap falls down. We will see if we can adhere this.  He has 1 or 2 minuscule open areas on the right still areas that are weeping on the posterior left calf, the base of his left second and third toes 10/26/18; back today in clinic with quite of skin breakdown in his left anterior leg. This may have been infection the area below the popliteal fossa seems a lot better however tremendous epithelial loss on the left anterior mid tibia area over quite inexpensive tissue. He has 2 blisters on the right side but no other open wound here. 10/29/2018; came in urgently to see [place] today and we worked him in for review. He states that the 4 layer compression on the right leg caused pain he had to cut it down to roughly his mid calf this caused swelling above the wrap and he has blisters and skin breakdown today. As a result of the pain he has not been using his pumps. Both legs are a lot more edematous and there is a lot of weeping fluid. 11/02/18; arrives in clinic with continued difficulties in the right leg> left. Leg is swollen and painful. multiple skin blisters and new open areas especially laterally. He has not been using his pumps on the right leg. 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 He is not systemically unwell. 11/09/2018; the patient claims he is being compliant with his pumps. He is finished the doxycycline I gave him last week. Culture I did of the wound on the right lateral leg showed a few very resistant methicillin staph aureus. This was resistant to doxycycline. Nevertheless he states the pain in the leg is a lot better which makes me wonder if the cultured organism was not really what was causing the problem nevertheless this is a very dangerous organism to be culturing out of any wound. His right leg is still a lot larger than the left. He is using an Radio broadcast assistant on this area, he blames a 4-layer compression for causing the original skin breakdown which I doubt is true however I cannot talk him  out of it. We have been using silver alginate to all of these areas which were initially blisters 11/16/2018; patient is being compliant with his external compression pumps at twice a day. Miraculously he arrives in clinic today with absolutely no open wounds. He has better edema control on the left where he has been using 4 layer compression versus wound of wounds on the right and I pointed this out to him.  There is no inflammation in the skin in his lower legs which is also somewhat unusual for him. There is no open wounds on the dorsal left foot. He has extremitease stockings at home and I have asked him to bring these in next week. 11/25/18 patient's lower extremity on examination today on the left appears for the most part to be wound free. He does have an open wound on the lateral aspect of the right lower extremity but this is minimal compared to what I've seen in past. He does request that we go ahead and wrap the left leg as well even though there's nothing open just so hopefully it will not reopen in short order. 1/28; patient has superficial open wounds on the right lateral calf left anterior calf and left posterior calf. His edema control is adequate. 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 We have been using silver alginate as the primary dressing. He claims compliance with his compression pumps 2/4; patient has superficial open wounds on numerous areas of his left calf and again one on the left dorsal foot. The areas on the right lateral calf have healed. The cellulitis that I gave him doxycycline for last week is also resolved this was mostly on the left anterior calf just below the tibial tuberosity. His edema looks fairly well-controlled. He tells me he went to see his primary doctor today and had blood work ordered 2/11; once again he has several open areas on the left calf left tibial area. Most of  these are small and appear to have healthy granulation. He does not have anything open on the right. The edema and control in his thighs is pretty good which is usually a good indication he has been using his pumps as requested. 2/18; he continues to have several small areas on the left calf and left tibial area. Most of these are small healthy granulation. We put him in his stocking on the right leg last week and he arrives with a superficial open area over the right upper tibia and a fairly large area on the right lateral tibia in similar condition. His edema control actually does not look too bad, he claims to be using his compression pumps twice a day 2/25. Continued small areas on the left calf and left tibial area. New areas especially on the right are identified just below the tibial tuberosity and on the right upper tibia itself. There are also areas of weeping edema fluid even without an obvious wound. He does not have a considerable degree of lymphedema but clearly there is more edema here than his skin can handle. He states he is using the pumps twice a day. We have an Unna boot on the right and 4 layer compression on the left. 3/3; he continues to have an area on the right lateral calf and right posterior calf just below the popliteal fossa. There is a fair amount of tenderness around the wound on the popliteal fossa but I did not see any evidence of cellulitis, could just be that the wrap came down and rubbed in this area. ooHe does not have an open area on the left leg however there is an area on the left dorsal foot at the base of the third toe ooWe have been using silver alginate to all wound areas 3/10; he did not have an open area on his left leg last time he  was here a week ago. T oday he arrives with a horizontal wound just below the tibial tuberosity and an area on the left lateral calf. He has intense erythema and tenderness in this area. The area is on the right lateral calf  and right posterior calf better than last week. We have been using silver alginate as usual 3/18 - Patient returns with 3 small open areas on left calf, and 1 small open area on right calf, the skin looks ok with no significant erythema, he continues the UNA boot on right and 4 layer compression on left. The right lateral calf wound is closed , the right posterior is small area. we will continue silver alginate to the areas. 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 He is been tolerating the dressing changes without complication. Fortunately she has made excellent improvement in regard to the overall status of his wounds. Unfortunately every time we cease wrapping him he ends up reopening in causing more significant issues at that point. Again I'm unsure of the best direction to take although I think the lymphedema clinic may be appropriate for him. 02/03/19 on evaluation today patient appears to be doing well in regard to the wounds that we saw him for last week unfortunately he has a new area on the proximal portion of his right medial/posterior lower extremity where the wrap somewhat slowed down and caused swelling and a blister to rub and open. Unfortunately this is the only opening that he has on either leg at this point. 02/17/19 on evaluation today patient's bilateral lower extremities appear to be doing well. He still completely healed in regard to the left lower extremity. In regard to the right lower extremity the area where the wrap and slid down and caused the blister still seems to be slightly open although this is dramatically better than during the last evaluation two weeks ago. I'm very pleased with the way this stands overall. 03/03/19 on evaluation today patient appears to be doing well in  regard to his right lower extremity in general although he did have a new blister open this does not appear to be showing any evidence of active infection at this time. Fortunately there's No fevers, chills, nausea, or vomiting noted at this time. Overall I feel like he is making good progress it does feel like that the right leg will we perform the [employer] seems to do with a bit better than three layer wrap on the left which slid down on him. We may switch to doing bilateral in the book wraps. 5/4; I have not seen Mr. Penkala in quite some time. According to our case manager he did not have an open wound on his left leg last week. He had 1 remaining wound on the right posterior medial calf. He arrives today with multiple openings on the left leg probably were blisters and/or wrap injuries from Unna boots. I do not think the Unna boot's will provide adequate compression on the left. I am also not clear about the frequency he is using the compression pumps. 03/17/19 on evaluation today patient appears to be doing excellent in regard to his lower extremities compared to last week's evaluation apparently. He had gotten significantly worse last week which is unfortunate. The [employer] wrap on the left did not  seem to do very well for him at all and in fact it didn't control his swelling significantly enough he had an additional outbreak. Subsequently we go back to the four layer compression wrap on the left. This is good news. At least in that he is doing better and the wound seem to be killing him. He still has not heard anything from the lymphedema clinic. 03/24/19 on evaluation today patient actually appears to be doing much better in regard to his bilateral lower Trinity as compared to last week when I saw him. Fortunately there's no signs of active infection at this time. He has been tolerating the dressing changes without complication. Overall I'm extremely pleased with the progress and appearance in  general. 04/07/19 on evaluation today patient appears to be doing well in regard to his bilateral lower extremities. His swelling is significantly down from where it was previous. With that being said he does have a couple blisters still open at this point but fortunately nothing that seems to be too severe and again the majority of the larger openings has healed at this time. 04/14/19 on evaluation today patient actually appears to be doing quite well in regard to his bilateral lower extremities in fact I'm not even sure there's anything significantly open at this time at any site. Nonetheless he did have some trouble with these wraps where they are somewhat irritating him secondary to the fact that he has noted that the graph wasn't too close down to the end of this foot in a little bit short as well up to his knee. Otherwise things seem to be doing quite well. 04/21/19 upon evaluation today patient's wound bed actually showed evidence of being completely healed in regard to both lower extremities which is excellent news. There does not appear to be any signs of active infection which is also good news. I'm very pleased in this regard. No fevers, chills, nausea, or vomiting noted at this time. 04/28/19 on evaluation today patient appears to be doing a little bit worse in regard to both lower extremities on the left mainly due to the fact that when he went infection disease the wrap was not wrapped quite high enough he developed a blister above this. On the right he is a small open area of nothing too significant but again this is continuing to give him some trouble he has been were in the Velcro compression that he has at home. 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 He's been tolerating the dressing changes without complication. Fortunately there's no signs of active infection at this time. No fevers, chills, nausea, or vomiting noted at  this time. We have been trying to get an appointment with her lymphedema clinic in [facility] but unfortunately nobody can get them on phone with not been able to even fax information over the patient likewise is not been able to get in touch with them. Overall I'm not sure exactly what's going on here with to reach out again today. 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 Still having a lot of drainage unfortunately. He tells me especially in the left but even on the right. There's no signs of active infection which is good news we've been using so ratcheted up to this point. 05/19/19 on evaluation today patient actually appears to be doing quite well with regard to  his left lower extremity which is great news. Fortunately in regard to the right lower extremity has an issues with his wrap and he subsequently did remove this from what I'm understanding. Nonetheless long story short is what he had rewrapped once he removed it subsequently had maggots underneath this wrap whenever he came in for evaluation today. With that being said they were obviously completely cleaned away by the nursing staff. The visit today which is excellent news. However he does appear to potentially have some infection around the right ankle region where the maggots were located as well. He will likely require anabiotic therapy today. 05/26/19 on evaluation today patient actually appears to be doing much better in regard to his bilateral lower extremities. I feel like the infection is under much better control. With that being said there were maggots noted when the wrap was removed yet again today. Again this could have potentially been left over from previous although at this time there does not appear to be any signs of significant drainage there was obviously on the wrap some drainage as well this contracted gnats or  otherwise. Either way I do not see anything that appears to be doing worse in my pinion and in fact I think his drainage has slowed down quite significantly likely mainly due to the fact to his infection being under better control. 06/02/2019 on evaluation today patient actually appears to be doing well with regard to his bilateral lower extremities there is no signs of active infection at this time which is great news. With that being said he does have several open areas more so on the right than the left but nonetheless these are all significantly better than previously noted. 06/09/2019 on evaluation today patient actually appears to be doing well. His wrap stayed up and he did not cause any problems he had more drainage on the right compared to the left but overall I do not see any major issues at this time which is great news. 06/16/2019 on evaluation today patient appears to be doing excellent with regard to his lower extremities the only area that is open is a new blister that can have opened as of today on the medial ankle on the left. Other than this he really seems to be doing great I see no major issues at this point. 06/23/2019 on evaluation today patient appears to be doing quite well with regard to his bilateral lower extremities. In fact he actually appears to be almost completely healed there is a small area of weeping noted of the right lower extremity just above the ankle. Nonetheless fortunately there is no signs of active infection at this time which is good news. No fevers, chills, nausea, vomiting, or diarrhea. 8/24; the patient arrived for a nurse visit today but complained of very significant pain in the left leg and therefore I was asked to look at this. Noted that he did not have an open area on the left leg last week nevertheless this was wrapped. The patient states that he is not been able to put his compression pumps on the left leg because of the discomfort. He has not been  systemically unwell 06/30/2019 on evaluation today patient unfortunately despite being excellent last week is doing much worse with regard to his left lower extremity today. In fact he had to come in for a nurse on Monday where his left leg had to be rewrapped due to excessive weeping Dr. Leanord Hawking placed him on doxycycline at that  point. Fortunately there is no signs of active infection Systemically at this time which is good news. 07/07/2019 in regard to the patient's wounds today he actually seems to be doing well with his right lower extremity there really is nothing open or draining at this point this is great news. Unfortunately the left lower extremity is given him additional trouble at this time. There does not appear to be any signs of active infection nonetheless he does have a lot of edema and swelling noted at this point as well as blistering all of which has led to a much more poor appearing leg at this time compared to where it was 2 weeks ago when it was almost completely healed. Obviously this is a little discouraging for the patient. He is try to contact the lymphedema clinic in [place] he has not been able to get through to them. 07/14/2019 on evaluation today patient actually appears to be doing slightly better with regard to his left lower extremity ulcers. Overall I do feel like at least at the top of the wrap that we have been placing this area has healed quite nicely and looks much better. The remainder of the leg is showing signs of improvement. Unfortunately in the thigh area he still has an open region on the left and again on the right he has been utilizing just a Band-Aid on an area that also opened on the thigh. Again this is an area that were not able to wrap although we did do an Ace wrap to provide some compression that something that obviously is a little less effective than the compression wraps we have been using on the lower portion of the leg. He does have an  appointment with the lymphedema clinic in [facility] on Friday. 07/21/2019 on evaluation today patient appears to be doing better with regard to his lower extremity ulcers. He has been tolerating the dressing changes without complication. Fortunately there is no signs of active infection at this time. No fevers, chills, nausea, vomiting, or diarrhea. I did receive the paperwork from the physical therapist at the lymphedema clinic in [place]. Subsequently I signed off on that this morning and sent that back to him for further progression with the treatment plan. 07/28/2019 on evaluation today patient appears to be doing very well with regard to his right lower extremity where I do not see any open wounds at this point. Fortunately he is feeling great as far as that is concerned as well. In regard to the left lower extremity he has been having issues with still several areas of weeping and edema although the upper leg is doing better his lower leg still I think is going require the compression wrap at this time. No fevers, chills, nausea, vomiting, or diarrhea. 08/04/2019 on evaluation today patient unfortunately is having new wounds on the right lower extremity. Again we have been using Unna boot wrap on that side. We switched him to using his juxta lite wrap at home. With that being said he tells me he has been using it although his legs extremely swollen and to be honest really does not appear that he has been. I cannot know that for sure however. Nonetheless he has multiple new wounds on the right lower extremity at this time. Obviously we will have to see about getting this rewrapped for him today. 08/11/2019 on evaluation today patient appears to be doing fairly well with regard to his wounds. He has been tolerating the dressing changes including the  compression wraps without complication. He still has a lot of edema in his upper thigh regions bilaterally he is supposed to be seeing the  lymphedema clinic on the 15th of this month once his wraps arrive for the upper part of his legs. 08/18/2019 on evaluation today patient appears to be doing well with regard to his bilateral lower extremities at this point. He has been tolerating the dressing changes without complication. Fortunately there is no signs of active infection which is also good news. He does have a couple weeping areas on the first and second toe of the right foot he also has just a small area on the left foot upper leg and a small area on the left lower leg but overall he is doing quite well in my opinion. He is supposed to be getting his wraps shortly in fact tomorrow and then subsequently is seeing the lymphedema clinic next Wednesday on the 21st. Of note he is also leaving on the 25th to go on vacation for a week to the beach. For that reason and since there is some uncertainty about what there can be doing at lymphedema clinic next Wednesday I am get a make an appointment for next Friday here for [place] to see what we need to do for him prior to him leaving for vacation. 10/23; patient arrives in considerable pain predominantly in the upper posterior calf just distal to the popliteal fossa also in the wound anteriorly above the major wound. This is probably cellulitis and he has had this recurrently in the past. He has no open wound on the right side and he has had an Radio broadcast assistant in that area. Finally I note that he has an area on the left posterior calf which by enlarge is mostly epithelialized. This protrudes beyond the borders of the surrounding skin in the setting of dry scaly skin and lymphedema. The patient is leaving for [facility] on Sunday. Per his longstanding pattern, he will not take his compression pumps with him predominantly out of fear that they will be stolen. He therefore asked that we put a Unna boot back on the right leg. He will also contact the wound care center in [facility] to see if they can  change his dressing in the mid week. 11/3; patient returned from his vacation to [facility]. He was seen on 1 occasion at their wound care center. They did a 2 layer compression system as they did not have our 4-layer wrap. I am not completely certain what they put on the wounds. They did not change the Unna boot on the right. The patient is also seeing a lymphedema specialist physical therapist in [place]. It appears that he has some compression sleeve for his thighs which indeed look quite a bit better than I am used to seeing. He pumps over these with his external compression pumps. 11/10; the patient has a new wound on the right medial thigh otherwise there is no open areas on the right. He has an area on the left leg posteriorly anteriorly and medially and an area over the left second toe. We have been using silver alginate. He thinks the injury on his thigh is secondary to friction from the compression sleeve he has. 11/17; the patient has a new wound on the right medial thigh last week. He thinks this is because he did not have a underlying stocking for his thigh juxta lite apparatus. He now has this. The area is fairly large and somewhat  angry but I do not think he has underlying cellulitis. ooHe has a intact blister on the right anterior tibial area. ooSmall wound on the right great toe dorsally ooSmall area on the medial left calf. 11/30; the patient does not have any open areas on his right leg and we did not take his juxta lite stocking off. However he states that on Friday his compression wrap fell down lodging around his upper mid calf area. As usual this creates a lot of problems for him. He called urgently today to be seen for a nurse visit however the nurse visit turned into a provider visit because of extreme erythema and pain in the left anterior tibia extending laterally and posteriorly. The area that is problematic is extensive 10/06/2019 upon evaluation today patient  actually appears to be doing poorly in regard to his left lower extremity. He Dr. Leanord Hawking did place him on doxycycline this past Monday apparently due to the fact that he was doing much worse in regard to this left leg. Fortunately the doxycycline does seem to be helping. Unfortunately we are still having a very difficult time getting his edema under any type of control in order to anticipate discharge at some point. The only way were really able to control his lymphedema really is with compression wraps and that has only even seemingly temporary. He has been seeing a lymphedema clinic they are trying to help in this regard but still this has been somewhat frustrating in general for the patient. 10/13/19 on evaluation today patient appears to be doing excellent with regard to his right lower extremity as far as the wounds are concerned. His swelling is still quite extensive unfortunately. He is still having a lot of drainage from the thigh areas bilaterally which is unfortunate. He's been going to lymphedema clinic but again he still really does not have this edema under control as far as his lower extremities are concern. With regard to his left lower extremity this seems to be improving and I do believe the doxycycline has been of benefit for him. He is about to complete the doxycycline. 10/20/2019 on evaluation today patient appears to be doing poorly in regard to his bilateral lower extremities. More in the right thigh he has a lot of irritation at this site unfortunately. In regard to the left lower extremity the wrap was not quite as high it appears and does seem to have caused him some trouble as well. Fortunately there is no evidence of systemic infection though he does have some blue-green drainage which has me concerned for the possibility of Pseudomonas. He tells me he is previously taking Cipro without complications and he really does not care for Levaquin however due to some of the  side effects he has. He is not allergic to any medications specifically antibiotics that were aware of. 10/27/2019 on evaluation today patient actually does appear to be for the most part doing better when compared to last week's evaluation. With that being said he still has multiple open wounds over the bilateral lower extremities. He actually forgot to start taking the Cipro and states that he still has the whole bottle. He does have several new blisters on left lower extremity today I think I would recommend he go ahead and take the Cipro based on what I am seeing at this point. 12/30-Patient comes at 1 week visit, 4 layer compression wraps on the left and Unna boot on the right, primary dressing Xtrasorb and silver alginate. Patient is  taking his Cipro and has a few more days left probably 5-6, and the legs are doing better. He states he is using his compressions devices which I believe he has 11/10/2019 on evaluation today patient actually appears to be much better than last time I saw him 2 weeks ago. His wounds are significantly improved and overall I am very pleased in this regard. Fortunately there is no signs of active infection at this time. He is just a couple of days away from completing Cipro. Overall his edema is much better he has been using his lymphedema pumps which I think is also helping at this point. 11/17/2019 on evaluation today patient appears to be doing excellent in regard to his wounds in general. His legs are swollen but not nearly as much as they have been in the past. Fortunately he is tolerating the compression wraps without complication. No fevers, chills, nausea, vomiting, or diarrhea. He does have some erythema however in the distal portion of his right lower extremity specifically around the forefoot and toes there is a little bit of warmth here as well. 11/24/2019 on evaluation today patient appears to be doing well with regard to his right lower extremity I really do  not see any open wounds at this point. His left lower extremity does have several open areas and his right medial thigh also is open. Other than this however overall the patient seems to be making good progress and I am very pleased at this point. 12/01/2019 on evaluation today patient appears to be doing poorly at this point in regard to his left lower extremity has several new blisters despite the fact that we have him in compression wraps. In fact he had a 4-layer compression wrap, his upper thigh wrapped from lymphedema clinic, and a juxta light over top of the 4 layer compression wrap the lymphedema clinic applied and despite all this he still develop blisters underneath. Obviously this does have me concerned about the fact that unfortunately despite what we are doing to try to get wounds healed he continues to have new areas arise I do not think he is ever good to be at the point where he can realistically just use wraps at home to keep things under control. Typically when we heal him it takes about 1-2 days before he is back in the clinic with severe breakdown and blistering of his lower extremities bilaterally. This is happened numerous times in the past. Unfortunately I think that we may need some help as far as overall fluid overload to kind of limit what we are seeing and get things under better control. 12/08/2019 on evaluation today patient presents for follow-up concerning his ongoing bilateral lower extremity edema. Unfortunately he is still having quite a bit of swelling the compression wraps are controlling this to some degree but he did see Dr. [name] his cardiologist I do have that available for review today as far as the appointment was concerned that was on 12/06/2019. Obviously that she has been 2 days ago. The patient states that he is only been taking the Lasix 80 mg 1 time a day he had told me previously he was taking this twice a day. Nonetheless Dr. [name] recommended this be up to  80 mg 2 times a day for the patient as he did appear to be fluid overloaded. With that being said the patient states he did this yesterday and he was unable to go anywhere or do anything due to the fact that  he was constantly having to urinate. Nonetheless I think that this is still good to be something that is important for him as far as trying to get his edema under control at all things that he is going to be able to just expect his wounds to get under control and things to be better without going through at least a period of time where he is trying to stabilize his fluid management in general and I think increasing the Lasix is likely the first step here. It was also mentioned the possibility that the patient may require metolazone. With that being said he wanted to have the patient take Lasix twice a day first and then reevaluating 2 months to see where things stand. 12/15/2019 upon evaluation today patient appears to be doing regard to his legs although his toes are showing some signs of weeping especially on the left at this point to some degree on the right. There does not appear to be any signs of active infection and overall I do feel like the compression wraps are doing well for him but he has not been able to take the Lasix at home and the increased dose that Dr. Debara Pickett recommended. He tells me that just not go to be feasible for him. Nonetheless I think in this case he should probably send a message to Dr. Debara Pickett in order to discuss options from the standpoint of possible admission to get the fluid off or otherwise going forward. 12/22/2019 upon evaluation today patient appears to be doing fairly well with regard to his lower extremities at this point. In fact he would be doing excellent if it was not for the fact that his right anterior thigh apparently had an allergic reaction to adhesive tape that he used. The wound itself that we have been monitoring actually appears to be healed. There is  a lot of irritation at this point. 12/29/2019 upon evaluation today patient appears to be doing well in regard to his lower extremities. His left medial thigh is open and somewhat draining today but this is the only region that is open the right has done much better with the treatment utilizing the steroid cream that I prescribed for him last week. Overall I am pleased in that regard. Fortunately there is no signs of active infection at this time. No fevers, chills, nausea, vomiting, or diarrhea. 01/05/2020 upon evaluation today patient appears to be doing more poorly in regard to his right lower extremity at this point upon evaluation today. Unfortunately he continues to have issues in this regard and I think the biggest issue is controlling his edema. This obviously is not very well controlled at this point is been recommended that he use the Lasix twice a day but he has not been able to do that. Unfortunately I think this is leading to an issue where honestly he is not really able to effectively control his edema and therefore the wounds really are not doing significantly better. I do not think that he is going to be able to keep things under good control unless he is able to control his edema much better. I discussed this again in great detail with him today. 01/12/2020 good news is patient actually appears to be doing quite well today at this point. He does have an appointment with lymphedema clinic tomorrow. His legs appear healed and the toe on the left is almost completely healed. In general I am very pleased with how things stand at this point. 01/19/2020  upon evaluation today patient appears to actually be doing well in regard to his lower extremities there is nothing open at this point. Fortunately he has done extremely well more recently. Has been seeing lymphedema clinic as well. With that being said he has Velcro wraps for his lower legs as well as his upper legs. The only wound really is on  his toe which is the right great toe and this is barely anything even there. With all that being said I think it is good to be appropriate today to go ahead and switch him over to the Velcro compression wraps. 01/26/2020 upon evaluation today patient appears to be doing worse with regard to his lower extremities after last week switch him to Velcro compression wraps. Unfortunately he lasted less than 24 hours he did not have the sock portion of his Velcro wrap on the left leg and subsequently developed a blister underneath the Velcro portion. Obviously this is not good and not what we were looking for at this point. He states the lymphedema clinic did tell him to wear the wrap for 23 hours and take him off for 1 I am okay with that plan but again right now we got a get things back under control again he may have some cellulitis noted as well. 02/02/2020 upon evaluation today patient unfortunately appears to have several areas of blistering on his bilateral lower extremities today mainly on the feet. His legs do seem to be doing somewhat better which is good news. Fortunately there is no evidence of active infection at this time. No fevers, chills, nausea, vomiting, or diarrhea. 02/16/2020 upon evaluation today patient appears to be doing well at this time with regard to his legs. He has a couple weeping areas on his toes but for the most part everything is doing better and does appear to be sealed up on his legs which is excellent news. We can continue with wrapping him at this point as he had every time we discontinue the wraps he just breaks out with new wounds. There is really no point in is going forward with this at this point. 03/08/2020 upon evaluation today patient actually appears to be doing quite well with regard to his lower extremity ulcers. He has just a very superficial and really almost nonexistent blister on the left lower extremity he has in general done very well with the compression  wraps. With that being said I do not see any signs of infection at this time which is good news. 03/29/2020 upon evaluation today patient appears to be doing well with regard to his wounds currently except for where he had several new areas that opened up due to some of the wrap slipping and causing him trouble. He states he did not realize they had slipped. Nonetheless he has a 1 area on the right and 3 new areas on the left. Fortunately there is no signs of active infection at this time which is great news. Objective Constitutional Obese and well-hydrated in no acute distress. Vitals Time Taken: 8:42 AM, Height: 70 in, Weight: 380.2 lbs, BMI: 54.5, Temperature: 98.4 F, Pulse: 58 bpm, Respiratory Rate: 22 breaths/min, Blood Pressure: 159/50 mmHg. Respiratory normal breathing without difficulty. Psychiatric this patient is able to make decisions and demonstrates good insight into disease process. Alert and Oriented x 3. pleasant and cooperative. General Notes: Patient's wound bed currently showed signs of good epithelization at several locations although he does have some breakdown at other spots. Again the biggest  issue was on his right lower extremity where the wrap slipped down but did begin to his upper leg just below the knee and unfortunately has caused some issues in this regard. Other than that overall I feel like the wounds are not significant although they definitely do need to be addressed. Integumentary (Hair, Skin) Wound #168 status is Open. Original cause of wound was Blister. The wound is located on the Right,Posterior Lower Leg. The wound measures 2.7cm length x 7cm width x 0.1cm depth; 14.844cm^2 area and 1.484cm^3 volume. There is Fat Layer (Subcutaneous Tissue) Exposed exposed. There is no tunneling or undermining noted. There is a medium amount of serosanguineous drainage noted. The wound margin is flat and intact. There is large (67-100%) red granulation within the wound  bed. There is no necrotic tissue within the wound bed. Wound #169 status is Open. Original cause of wound was Gradually Appeared. The wound is located on the Left,Dorsal Foot. The wound measures 3.3cm length x 3.2cm width x 0.1cm depth; 8.294cm^2 area and 0.829cm^3 volume. There is Fat Layer (Subcutaneous Tissue) Exposed exposed. There is no tunneling or undermining noted. There is a medium amount of serosanguineous drainage noted. The wound margin is flat and intact. There is large (67-100%) red granulation within the wound bed. There is no necrotic tissue within the wound bed. Wound #170 status is Open. Original cause of wound was Gradually Appeared. The wound is located on the Left,Distal,Dorsal Foot. The wound measures 0.3cm length x 1cm width x 0.1cm depth; 0.236cm^2 area and 0.024cm^3 volume. There is Fat Layer (Subcutaneous Tissue) Exposed exposed. There is no tunneling or undermining noted. There is a small amount of serosanguineous drainage noted. The wound margin is flat and intact. There is large (67-100%) red granulation within the wound bed. There is no necrotic tissue within the wound bed. Wound #171 status is Open. Original cause of wound was Gradually Appeared. The wound is located on the Left,Medial Malleolus. The wound measures 0.6cm length x 1cm width x 0.1cm depth; 0.471cm^2 area and 0.047cm^3 volume. There is Fat Layer (Subcutaneous Tissue) Exposed exposed. There is no tunneling or undermining noted. There is a small amount of serosanguineous drainage noted. The wound margin is flat and intact. There is large (67-100%) red granulation within the wound bed. There is no necrotic tissue within the wound bed. 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 There was a Radio broadcast assistant Compression Therapy Procedure by Yevonne Pax, RN. Post procedure Diagnosis Wound #168: Same as Pre-Procedure Wound #171 Pre-procedure diagnosis of Wound #171 is a Venous Leg Ulcer located on the Left,Medial Malleolus . There was a Four Layer Compression Therapy Procedure by Yevonne Pax, RN. Post procedure Diagnosis Wound #171: Same as Pre-Procedure Plan Follow-up Appointments: Return Appointment in 1 week. Dressing Change Frequency: Do not change entire dressing for one week. - both legs Skin Barriers/Peri-Wound Care: Antifungal cream - to both feet Moisturizing lotion - mixed with TCA cream to legs Wound Cleansing: May shower with protection. Primary Wound Dressing: Wound #168 Right,Posterior Lower Leg: Calcium Alginate with Silver  Wound #169 Left,Dorsal Foot: Calcium Alginate with Silver Wound #170 Left,Distal,Dorsal Foot: Calcium Alginate with Silver Wound #171 Left,Medial Malleolus: Calcium Alginate with Silver Secondary Dressing: Wound #168 Right,Posterior Lower Leg: Dry Gauze ABD pad - as needed Wound #169 Left,Dorsal Foot: Dry Gauze ABD pad - as needed Wound #170 Left,Distal,Dorsal Foot: Dry Gauze ABD pad - as needed Wound #171 Left,Medial Malleolus: Dry Gauze ABD pad - as needed Edema Control: 4 layer compression: Left lower extremity Unna Boot to Right Lower Extremity Avoid standing for long periods of time Elevate legs to the level of the heart or above for 30 minutes daily and/or when sitting, a frequency of: - whenever sitting Exercise regularly Segmental Compressive Device. - lymphadema pumps 60 minutes 2 times per  day Other: - If feet or wraps become wet, remove wrap and apply juxtalites Additional Orders / Instructions: Other: - take lasix as prescribed by cardiologist 1. I would recommend currently that we go ahead and initiate continuation of treatment with the current wound care measures specifically with regard to silver alginate to the open wound locations and the patient is in agreement with that plan. 2. I am also can recommend at this time that we continue with the 4 layer compression wrap for the left lower extremity and Unna boot wrap for the right lower extremity. 3. He needs to continue to take his Lasix as directed by his doctor and keep his legs elevated as much as possible. We will see patient back for reevaluation in 1 week here in the clinic. If anything worsens or changes patient will contact our office for additional recommendations. Electronic Signature(s) Signed: 03/29/2020 9:48:04 AM By: Lenda Kelp PA-C Entered By: Lenda Kelp on 03/29/2020 09:48:04 -------------------------------------------------------------------------------- SuperBill Details Patient Name: Date of Service: [name], Esaiah J. 03/29/2020 Medical Record Number: [id number] Patient Account Number: [id number] Date of Birth/Sex: Treating RN: [birth year] (69 y.o. [name] Primary Care Provider: Marney Setting, [name] Other Clinician: Referring Provider: Treating Provider/Extender: Lenda Kelp [facility] [name], [name] in Treatment: 218 Diagnosis Coding ICD-10 Codes Code Description [phone] Non-pressure chronic ulcer of right calf limited to breakdown of skin L97.221 Non-pressure chronic ulcer of left calf limited to breakdown of skin 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 of bilateral lower extremity I89.0 Lymphedema, not elsewhere classified E11.622 Type 2 diabetes mellitus with other skin ulcer 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,  unspecified L03.116 Cellulitis of left lower limb Facility Procedures CPT4 Code: 29562130 Description: (Facility Use Only) [phone] - APPLY UNNA BOOT RT Modifier: Quantity: 1 CPT4 Code: [id number] Description: (Facility Use Only) [id number] - APPLY MULTLAY COMPRS LWR LT LEG Modifier: 59 Quantity: 1 Physician Procedures : CPT4 Code Description Modifier [id number] - WC PHYS LEVEL 3 - EST PT 1 ICD-10 Diagnosis Description 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 skin L97.221 Non-pressure chronic ulcer of left calf limited to breakdown of  skin 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 of bilateral lower extremity I89.0 Lymphedema, not elsewhere classified Quantity: Electronic Signature(s) Signed: 03/29/2020 9:50:24 AM By: Lenda Kelp PA-C Entered By: Lenda Kelp on 03/29/2020 09:50:17

## 2020-03-29 NOTE — Telephone Encounter (Signed)
Received med supply order for patient, Placed on PCP desk to review and sign, if appropriate.

## 2020-03-31 DIAGNOSIS — I890 Lymphedema, not elsewhere classified: Secondary | ICD-10-CM | POA: Diagnosis not present

## 2020-03-31 DIAGNOSIS — R29898 Other symptoms and signs involving the musculoskeletal system: Secondary | ICD-10-CM | POA: Diagnosis not present

## 2020-04-05 ENCOUNTER — Encounter (HOSPITAL_BASED_OUTPATIENT_CLINIC_OR_DEPARTMENT_OTHER): Payer: Medicare Other | Attending: Physician Assistant | Admitting: Physician Assistant

## 2020-04-05 DIAGNOSIS — I890 Lymphedema, not elsewhere classified: Secondary | ICD-10-CM | POA: Diagnosis not present

## 2020-04-05 DIAGNOSIS — E11622 Type 2 diabetes mellitus with other skin ulcer: Secondary | ICD-10-CM | POA: Insufficient documentation

## 2020-04-05 DIAGNOSIS — E1140 Type 2 diabetes mellitus with diabetic neuropathy, unspecified: Secondary | ICD-10-CM | POA: Diagnosis not present

## 2020-04-05 DIAGNOSIS — L97221 Non-pressure chronic ulcer of left calf limited to breakdown of skin: Secondary | ICD-10-CM | POA: Insufficient documentation

## 2020-04-05 DIAGNOSIS — L97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 right calf limited to breakdown of skin: Secondary | ICD-10-CM | POA: Diagnosis not present

## 2020-04-05 DIAGNOSIS — I87333 Chronic venous hypertension (idiopathic) with ulcer and inflammation of bilateral lower extremity: Secondary | ICD-10-CM | POA: Insufficient documentation

## 2020-04-05 DIAGNOSIS — L03116 Cellulitis of left lower limb: Secondary | ICD-10-CM | POA: Diagnosis not present

## 2020-04-05 DIAGNOSIS — Z79899 Other long term (current) drug therapy: Secondary | ICD-10-CM | POA: Diagnosis not present

## 2020-04-05 DIAGNOSIS — Z794 Long term (current) use of insulin: Secondary | ICD-10-CM | POA: Insufficient documentation

## 2020-04-05 DIAGNOSIS — E1121 Type 2 diabetes mellitus with diabetic nephropathy: Secondary | ICD-10-CM | POA: Insufficient documentation

## 2020-04-05 DIAGNOSIS — E1151 Type 2 diabetes mellitus with diabetic peripheral angiopathy without gangrene: Secondary | ICD-10-CM | POA: Diagnosis not present
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
## 2020-04-05 NOTE — Progress Notes (Addendum)
[name], [name] ([id number]) Visit Report for 04/05/2020 Chief Complaint Document Details Patient Name: Date of Service: Kevin LARS, JEZIORSKI 04/05/2020 10:15 A M Medical Record Number: [id number] Patient Account Number: [id number] Date of Birth/Sex: Treating RN: 1951-01-19 (69 y.o. [name] Primary Care Provider: Marney Setting, [name] Other Clinician: Referring Provider: Treating Provider/Extender: Lenda Kelp [name], [name] in Treatment: 219 Information Obtained from: Patient Chief Complaint patient is here for evaluation venous/lymphedema weeping Electronic Signature(s) Signed: 04/05/2020 10:45:36 AM By: Lenda Kelp PA-C Entered By: Lenda Kelp on 04/05/2020 10:45:35 -------------------------------------------------------------------------------- HPI Details Patient Name: Date of Service: [name], [name] 04/05/2020 10:15 A M Medical Record Number: [id number] Patient Account Number: [id number] Date of Birth/Sex: Treating RN: Oct 17, 1951 (69 y.o. [name] Primary Care Provider: Marney Setting, [name] Other Clinician: Referring Provider: Treating Provider/Extender: Lenda Kelp [name], [name] in Treatment: 219 History of Present Illness HPI Description: Referred by PCP for consultation. Patient has long standing history of BLE venous stasis, no prior ulcerations. At beginning of month, developed cellulitis and weeping. Received IM Rocephin followed by Keflex and resolved. Wears compression stocking, appr 6 months old. Not sure strength. No present drainage. 01/22/16 this is a patient who is a type II diabetic on insulin. 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 He tells me he religiously wears pressure stockings of uncertain strength. He was here with weeping edema about 8 months ago but did not have an open wound. Roughly a month ago he had a reopening on his bilateral legs. He is been using bandages and Neosporin. He  does not complain of pain. 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 Last BUN/creatinine I have is from 11/20/15 at 13 and 1.0 respectively 01/29/16; patient arrives today having tolerated the Profore wrap. He brought in his stockings and these are 18 mmHg stockings he bought from [employer]. The compression here is likely inadequate. He does not complain of pain or excessive drainage she has no systemic symptoms. The wound on the right looks improved as does the one on the left although one on the left is more substantial with still tissue at risk below the actual wound area on the bilateral posterior calf 02/05/16; patient arrives with poor edema control. He states that we did put a 4 layer compression on it last week. No weight appear 5 this. 02/12/16; the area on the posterior right Has healed. The left Has a substantial wound that has necrotic surface eschar that requires a debridement with a curette. 02/16/16;the patient called or a Nurse visit secondary to increased swelling. He had been in earlier in the week with his right leg healed. He was transitioned to is on pressure stocking on the right leg with the only open wound on the left, a substantial area on the left posterior calf. Note he has a history of severe lower extremity edema, he has a history of chronic atrial fibrillation but not heart failure per my notes but I'll need to research this. He is not complaining of chest pain shortness of breath or orthopnea. The intake nurse noted blisters on the previously closed right leg 02/19/16; this is the patient's regular visit day. I see him on Friday with escalating edema new wounds on the right leg and clear signs of at least right ventricular heart failure. I increased his Lasix to  40 twice a day. He is returning currently in follow-up. States he is noticed a decrease in that the  edema 02/26/16 patient's legs have much less edema. There is nothing really open on the right leg. The left leg has improved condition of the large superficial wound on the posterior left leg 03/04/16; edema control is very much better. The patient's right leg wounds have healed. On the left leg he continues to have severe venous inflammation on the posterior aspect of the left leg. There is no tenderness and I don't think any of this is cellulitis. 03/11/16; patient's right leg is married healed and he is in his own stocking. The patient's left leg has deteriorated somewhat. There is a lot of erythema around the wound on the posterior left leg. There is also a significant rim of erythema posteriorly just above where the wrap would've ended there is a new wound in this location and a lot of tenderness. Can't rule out cellulitis in this area. 03/15/16; patient's right leg remains healed and he is in his own stocking. The patient's left leg is much better than last review. His major wound on the posterior aspect of his left Is almost fully epithelialized. He has 3 small injuries from the wraps. Really. Erythema seems a lot better on antibiotics 03/18/16; right leg remains healed and he is in his own stocking. The patient's left leg is much better. The area on the posterior aspect of the left calf is fully epithelialized. His 3 small injuries which were wrap injuries on the left are improved only one seems still open his erythema has resolved 03/25/16; patient's right leg remains healed and he is in his own stocking. There is no open area today on the left leg posterior leg is completely closed up. His wrap injuries at the superior aspect of his leg are also resolved. He looks as though he has some irritation on the dorsal ankle but this is fully epithelialized without evidence of infection. 03/28/16; we discharged this patient on Monday. Transitioned him into his own stocking. There were problems almost  immediately with uncontrolled swelling weeping edema multiple some of which have opened. He does not feel systemically unwell in particular no chest pain no shortness of breath and he does not feel 04/08/16; the edema is under better control with the Profore light wrap but he still has pitting edema. There is one large wound anteriorly 2 on the medial aspect of his left leg and 3 small areas on the superior posterior calf. Drainage is not excessive he is tolerating a Profore light well 04/15/16; put a Profore wrap on him last week. This is controlled is edema however he had a lot of pain on his left anterior foot most of his wounds are healed 04/22/16 once again the patient has denuded areas on the left anterior foot which he states are because his wrap slips up word. He saw his primary physician today is on Lasix 40 twice a day and states that he his weight is down 20 pounds over the last 3 months. 04/29/16: Much improved. left anterior foot much improved. He is now on Lasix 80 mg per day. Much improved edema control 05/06/16; I was hoping to be able to discharge him today however once again he has blisters at a low level of where the compression was placed last week mostly on his left lateral but also his left medial leg and a small area on the anterior part of the left foot. 05/09/16;  apparently the patient went home after his appointment on 7/4 later in the evening developing pain in his upper medial thigh together with subjective fever and chills although his temperature was not taken. The pain was so intense he felt he would probably have to call 911. However he then remembered that he had leftover doxycycline from a previous round of antibiotics and took these. By the next morning he felt a lot better. He called and spoke to one of our nurses and I approved doxycycline over the phone thinking that this was in relation to the wounds we had previously seen although they were definitely were not. The  patient feels a lot better old fever no chills he is still working. Blood sugars are reasonably controlled 05/13/16; patient is back in for review of his cellulitis on his anterior medial upper thigh. He is taking doxycycline this is a lot better. Culture I did of the nodular area on the dorsal aspect of his foot grew MRSA this also looks a lot better. 05/20/16; the patient is cellulitis on the medial upper thigh has resolved. All of his wound areas including the left anterior foot, areas on the medial aspect of the left calf and the lateral aspect of the calf at all resolved. He has a new blister on the left dorsal foot at the level of the fourth toe this was excised. No evidence of infection 05/27/16; patient continues to complain weeping edema. He has new blisterlike wounds on the left anterior lateral and posterior lateral calf at the top of his wrap levels. The area on his left anterior foot appears better. He is not complaining of fever, pain or pruritus in his feet. 05/30/16; the patient's blisters on his left anterior leg posterior calf all look improved. He did not increase the Lasix 100 mg as I suggested because he was going to run out of his 40 mg tablets. He is still having weeping edema of his toes 06/03/16; I renewed his Lasix at 80 mg once a day as he was about to run out when I last saw him. He is on 80 mg of Lasix now. I have asked him to cut down on the excessive amount of water he was drinking and asked him to drink according to his thirst mechanisms 06/12/2016 -- was seen 2 days ago and was supposed to wear his compression stockings at home but he is developed lymphedema and superficial blisters on the left lower extremity and hence came in for a review 06/24/16; the remaining wound is on his left anterior leg. He still has edema coming from between his toes. There is lymphedema here however his edema is generally better than when I last saw this. He has a history of atrial fibrillation  but does not have a known history of congestive heart failure nevertheless I think he probably has this at least on a diastolic basis. 07/01/16 I reviewed his echocardiogram from January 2017. This was essentially normal. He did not have LVH, EF of 55-60%. His right ventricular function was normal although he did have trivial tricuspid and pulmonic regurgitation. This is not audible on exam however. I increased his Lasix to do massive edema in his legs well above his knees I think in early July. He was also drinking an excessive amount of water at the time. 07/15/16; missed his appointment last week because of the Labor Day holiday on Monday. He could not get another appointment later in the week. Started to feel the wrap  digging in superiorly so we remove the top half and the bottom half of his wrap. He has extensive erythema and blistering superiorly in the left leg. Very tender. Very swollen. Edema in his foot with leaking edema fluid. He has not been systemically unwell 07/22/16; the area on the left leg laterally required some debridement. The medial wounds look more stable. His wrap injury wounds appear to have healed. Edema and his foot is better, weeping edema is also better. He tells me he is meeting with the supplier of the external compression pumps at work 08/05/16; the patient was on vacation last week in [name]. His wrap is been on for an extended period of time. Also over the weekend he developed an extensive area of tender erythema across his anterior medial thigh. He took to doxycycline yesterday that he had leftover from a previous prescription. The patient complains of weeping edema coming out of his toes 08/08/16; I saw this patient on 10/2. He was tender across his anterior thigh. I put him on doxycycline. He returns today in follow-up. He does not have any open wounds on his lower leg, he still has edema weeping into his toes. 08/12/16; patient was seen back urgently today to  follow-up for his extensive left thigh cellulitis/erysipelas. He comes back with a lot less swelling and erythema pain is much better. I believe I gave him Augmentin and Cipro. His wrap was cut down as he stated a roll down his legs. He developed blistering above the level of the wrap that remained. He has 2 open blisters and 1 intact. 08/19/16; patient is been doing his primary doctor who is increased his Lasix from 40-80 once a day or 80 already has less edema. Cellulitis has remained improved in the left thigh. 2 open areas on the posterior left calf 08/26/16; he returns today having new open blisters on the anterior part of his left leg. He has his compression pumps but is not yet been shown how to use some vital representative from the supplier. 09/02/16 patient returns today with no open wounds on the left leg. Some maceration in his plantar toes 09/10/2016 -- [name] had recently discharged him on 09/02/2016 and he has come right back with redness swelling and some open ulcers on his left lower extremity. He says this was caused by trying to apply his compression stockings and he's been unable to use this and has not been able to use his lymphedema pumps. He had some doxycycline leftover and he has started on this a few days ago. 09/16/16; there are no open wounds on his leg on the left and no evidence of cellulitis. 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 He does not complain of symptoms here. I am not clear use using his external compression pumps. 09/23/16; I have not seen this patient in 2 weeks. He canceled his appointment 10 days ago as he was going on vacation. He tells me that on Monday he noticed a large area on his posterior left leg which is been draining copiously and is reopened into a large wound. He is been using ABDs and the external part of his juxtalite, according to our nurse this was not on properly. 10/07/16;  Still a substantial area on the posterior left leg. Using silver alginate 10/14/16; in general better although there is still open area which looks healthy. Still using silver alginate. He reminds me that this happen before he  left for [name]. T oday while he was showering in the morning. He had been using his juxtalite's 10/21/16; the area on his posterior left leg is fully epithelialized. However he arrives today with a large area of tender erythema in his medial and posterior left thigh just above the knee. I have marked the area. Once again he is reluctant to consider hospitalization. I treated him with oral antibiotics in the past for a similar situation with resolution I think with doxycycline however this area it seems more extensive to me. He is not complaining of fever but does have chills and says states he is thirsty. His blood sugar today was in the 140s at home 10/25/16 the area on his posterior left leg is fully epithelialized although there is still some weeping edema. The large area of tenderness and erythema in his medial and posterior left thigh is a lot less tender although there is still a lot of swelling in this thigh. He states he feels a lot better. He is on doxycycline and Augmentin that I started last week. This will continued until Tuesday, December 26. I have ordered a duplex ultrasound of the left thigh rule out DVT whether there is an abscess something that would need to be drained I would also like to know. 11/01/16; he still has weeping edema from a not fully epithelialized area on his left posterior calf. Most of the rest of this looks a lot better. He has completed his antibiotics. His thigh is a lot better. Duplex ultrasound did not show a DVT in the thigh 11/08/16; he comes in today with more Denuded surface epithelium from the posterior aspect of his calf. There is no real evidence of cellulitis. The superior aspect of his wrap appears to have put quite an  indentation in his leg just below the knee and this may have contributed. He does not complain of pain or fever. We have been using silver alginate as the primary dressing. The area of cellulitis in the right thigh has totally resolved. He has been using his compression stockings once a week 11/15/16; the patient arrives today with more loss of epithelium from the posterior aspect of his left calf. He now has a fairly substantial wound in this area. The reason behind this deterioration isn't exactly clear although his edema is not well controlled. He states he feels he is generally more swollen systemically. He is not complaining of chest pain shortness of breath fever. T me he has an appointment with his primary physician in early February. He is on 80 mg of oral ells Lasix a day. He claims compliance with the external compression pumps. He is not having any pain in his legs similar to what he has with his recurrent cellulitis 11/22/16; the patient arrives a follow-up of his large area on his left lateral calf. This looks somewhat better today. He came in earlier in the week for a dressing change since I saw him a week ago. He is not complaining of any pain no shortness of breath no chest pain 11/28/16; the patient arrives for follow-up of his large area on the left lateral calf this does not look better. In fact it is larger weeping edema. The surface of the wound does not look too bad. We have been using silver alginate although I'm not certain that this is a dressing issue. 12/05/16; again the patient follows up for a large wound on the left lateral and left posterior calf this does not  look better. There continues to be weeping edema necrotic surface tissue. More worrisome than this once again there is erythema below the wound involving the distal Achilles and heel suggestive of cellulitis. He is on his feet working most of the day of this is not going well. We are changing his dressing twice a week to  facilitate the drainage. 12/12/16; not much change in the overall dimensions of the large area on the left posterior calf. This is very inflamed looking. I gave him an. Doxycycline last week does not really seem to have helped. He found the wrap very painful indeed it seems to of dog into his legs superiorly and perhaps around the heel. He came in early today because the drainage had soaked through his dressings. 12/19/16- patient arrives for follow-up evaluation of his left lower extremity ulcers. He states that he is using his lymphedema pumps once daily when there is "no drainage". He admits to not using his lipedema pumps while under current treatment. His blood sugars have been consistently between 150-200. 12/26/16; the patient is not using his compression pumps at home because of the wetness on his feet. I've advised him that I think it's important for him to use this daily. He finds his feet too wet, he can put a plastic bag over his legs while he is in the pumps. Otherwise I think will be in a vicious circle. We are using silver alginate to the major area on his left posterior calf 01/02/17; the patient's posterior left leg has further of all into 3 open wounds. All of them covered with a necrotic surface. He claims to be using his compression pumps once a day. His edema control is marginal. Continue with silver alginate 01/10/17; the patient's left posterior leg actually looks somewhat better. There is less edema, less erythema. Still has 3 open areas covered with a necrotic surface requiring debridement. He claims to be using his compression pumps once a day his edema control is better 01/17/17; the patient's left posterior calf look better last week when I saw him and his wrap was changed 2 days ago. He has noted increasing pain in the left heel and arrives today with much larger wounds extensive erythema extending down into the entire heel area especially tender medially. He is not  systemically unwell CBGs have been controlled no fever. Our intake nurse showed me limegreen drainage on his AVD pads. 01/24/17; his usual this patient responds nicely to antibiotics last week giving him Levaquin for presumed Pseudomonas. The whole entire posterior part of his leg is much better much less inflamed and in the case of his Achilles heel area much less tender. He has also had some epithelialization posteriorly there are still open areas here and still draining but overall considerably better 01/31/17- He has continue to tolerate the compression wraps. he states that he continues to use the lymphedema pumps daily, and can increase to twice daily on the weekends. He is voicing no complaints or concerns regarding his LLE ulcers 02/07/17-he is here for follow-up evaluation. He states that he noted some erythema to the left medial and anterior thigh, which he states is new as of yesterday. He is concerned about recurrent cellulitis. He states his blood sugars have been slightly elevated, this morning in the 180s 02/14/17; he is here for follow-up evaluation. When he was last here there was erythema superiorly from his posterior wound in his anterior thigh. He was prescribed Levaquin however a culture of the wound surface  grew MRSA over the phone I changed him to doxycycline on Monday and things seem to be a lot better. 02/24/17; patient missed his appointment on Friday therefore we changed his nurse visit into a physician visit today. Still using silver alginate on the large area of the posterior left thigh. He isn't new area on the dorsal left second toe 03/03/17; actually better today although he admits he has not used his external compression pumps in the last 2 days or so because of work responsibilities over the weekend. 03/10/17; continued improvement. External compression pumps once a day almost all of his wounds have closed on the posterior left calf. Better edema control 03/17/17; in general  improved. He still has 3 small open areas on the lateral aspect of his left leg however most of the area on the posterior part of his leg is epithelialized. He has better edema control. He has an ABD pad under his stocking on the right anterior lower leg although he did not let [name] look at that today. 03/24/17; patient arrives back in clinic today with no open areas however there are areas on the posterior left calf and anterior left calf that are less than 100% epithelialized. His edema is well controlled in the left lower leg. There is some pitting edema probably lymphedema in the left upper thigh. He uses compression pumps at home once per day. I tried to get him to do this twice a day although he is very reticent. 04/01/2017 -- for the last 2 days he's had significant redness, tenderness and weeping and came in for an urgent visit today. 04/07/17; patient still has 6 more days of doxycycline. He was seen by [name] last Wednesday for cellulitis involving the posterior aspect, lateral aspect of his Involving his heel. For the most part he is better there is less erythema and less weeping. He has been on his feet for 12 hours 2 over the weekend. Using his compression pumps once a day 04/14/17 arrives today with continued improvement. Only one area on the posterior left calf that is not fully epithelialized. He has intense bilateral venous inflammation associated with his chronic venous insufficiency disease and secondary lymphedema. We have been using silver alginate to the left posterior calf wound In passing he tells [name] today that the right leg but we have not seen in quite some time has an open area on it but he doesn't want [name] to look at this today states he will show this to [name] next week. 04/21/17; there is no open area on his left leg although he still reports some weeping edema. He showed [name] his right leg today which is the first time we've seen this leg in a long time. He has a large area of  open wound on the right leg anteriorly healthy granulation. Quite a bit of swelling in the right leg and some degree of venous inflammation. He told [name] about the right leg in passing last week but states that deterioration in the right leg really only happened over the weekend 04/28/17; there is no open area on the left leg although there is an irritated part on the posterior which is like a wrap injury. The wound on the right leg which was new from last week at least to [name] is a lot better. 05/05/17; still no open area on the left leg. Patient is using his new compression stocking which seems to be doing a good job of controlling the edema. He states he  is using his compression pumps once per day. The right leg still has an open wound although it is better in terms of surface area. Required debridement. A lot of pain in the posterior right Achilles marked tenderness. Usually this type of presentation this patient gives concern for an active cellulitis 05/12/17; patient arrives today with his major wound from last week on the right lateral leg somewhat better. Still requiring debridement. He was using his compression stocking on the left leg however that is reopened with superficial wounds anteriorly he did not have an open wound on this leg previously. He is still using his juxta light's once daily at night. He cannot find the time to do this in the morning as he has to be at work by 7 AM 05/19/17; right lateral leg wound looks improved. No debridement required. The concerning area is on the left posterior leg which appears to almost have a subcutaneous hemorrhagic component to it. We've been using silver alginate to all the wounds 05/26/17; the right lateral leg wound continues to look improved. However the area on the left posterior calf is a tightly adherent surface. [place] using silver alginate. Because of the weeping edema in his legs there is very little good alternatives. 06/02/17; the patient left  here last week looking quite good. Major wound on the left posterior calf and a small one on the right lateral calf. Both of these look satisfactory. He tells me that by Wednesday he had noted increased pain in the left leg and drainage. He called on Thursday and Friday to get an appointment here but we were blocked. He did not go to urgent care or his primary physician. He thinks he had a fever on Thursday but did not actually take his temperature. He has not been using his compression pumps on the left leg because of pain. I advised him to go to the emergency room today for IV antibiotics for stents of left leg cellulitis but he has refused I have asked him to take 2 days off work to keep his leg elevated and he has refused this as well. In view of this I'm going to call him and Augmentin and doxycycline. 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 However after a detailed discussion regarding the risk benefits and alternatives he refuses to get admitted to the [name]. With no other recourse I will continue him on oral antibiotics as before and hopefully he'll have his infectious disease consultation this week. 06/16/2017 -- the patient was seen today by the nurse practitioner at infectious disease [name]. 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 He has also been advise Lotrimin cream locally. she also had wise IV antibiotics for his condition if it flares up 06/23/17; patient arrives today with drainage bilaterally although the remaining wound on the left posterior calf after  cleaning up today "highlighter yellow drainage" did not look too bad. Unfortunately he has had breakdown on the right anterior leg [previously this leg had not been open and he is using a black stocking] he went  to see infectious disease and is been put on clindamycin 150 mg daily, I did not verify the dose although I'm not familiar with using clindamycin in this dosing range, perhaps for prophylaxisoo 06/27/17; I brought this patient back today to follow-up on the wound deterioration on the right lower leg together with surrounding cellulitis. I started him on doxycycline 4 days ago. This area looks better however he comes in today with intense cellulitis on the medial part of his left thigh. This is not have a wound in this area. Extremely tender. We've been using silver alginate to the wounds on the right lower leg left lower leg with bilateral 4 layer compression he is using his external compression pumps once a day 07/04/17; patient's left medial thigh cellulitis looks better. He has not been using his compression pumps as his insert said it was contraindicated with cellulitis. His right leg continues to make improvements all the wounds are still open. We only have one remaining wound on the left posterior calf. Using silver alginate to all open areas. He is on doxycycline which I started a week ago and should be finishing I gave him Augmentin after Thursday's visit for the severe cellulitis on the left medial thigh which fortunately looks better 07/14/17; the patient's left medial thigh cellulitis has resolved. The cellulitis in his right lower calf on the right also looks better. All of his wounds are stable to improved we've been using silver alginate he has completed the antibiotics I have given him. He has clindamycin 150 mg once a day prescribed by infectious disease for prophylaxis, I've advised him to start this now. We have been using bilateral Unna boots over silver alginate to the wound  areas 07/21/17; the patient is been to see infectious disease who noted his recurrent problems with cellulitis. He was not able to tolerate prophylactic clindamycin therefore he is on amoxicillin 500 twice a day. He also had a second daily dose of Lasix added By [name] but he is not taking this. Nor is he being completely compliant with his compression pumps a especially not this week. He has 2 remaining wounds one on the right posterior lateral lower leg and one on the left posterior medial lower leg. 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 The patient has Unna boots bilaterally. 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 He has bilateral Unna boots for compression and silver alginate to his wounds. Arrives today with his legs looking as good as I have seen him in quite some time. Not surprisingly his wounds look better as well with improvement on the right lateral leg venous insufficiency wound and also the left medial leg. He is still using the compression pumps once a day 08/11/17; both legs appear to be doing better wounds on the right lateral and left medial legs look better. Skin on the right leg quite good. He is been using silver alginate as the primary dressing. I'm going to use Anasept gel calcium alginate and maintain all the secondary dressings 08/18/17; the patient continues to actually do quite well. The area on his right lateral leg is just about closed the left medial also looks better although it is still moist in this area. His edema is well controlled we have been using Anasept gel  with calcium alginate and the usual secondary dressings, 4 layer compression and once daily use of his compression pumps "always been able to manage 09/01/17; the patient  continues to do reasonably well in spite of his trip to [place]. The area on the right lateral leg is epithelialized. Left is much better but still open. He has more edema and more chronic erythema on the left leg [venous inflammation] 09/08/17; he arrives today with no open wound on the right lateral leg and decently controlled edema. Unfortunately his left leg is not nearly as in his good situation as last week.he apparently had increasing edema starting on Saturday. He edema soaked through into his foot so used a plastic bag to walk around his home. The area on the medial right leg which was his open area is about the same however he has lost surface epithelium on the left lateral which is new and he has significant pain in the Achilles area of the left foot. He is already on amoxicillin chronically for prophylaxis of cellulitis in the left leg 09/15/17; he is completed a week of doxycycline and the cellulitis in the left posterior leg and Achilles area is as usual improved. He still has a lot of edema and fluid soaking through his dressings. There is no open wound on the right leg. He saw infectious disease NP today 09/22/17;As usual 1 we transition him from our compression wraps to his stockings things did not go well. He has several small open areas on the right leg. He states this was caused by the compression wrap on his skin although he did not wear this with the stockings over them. He has several superficial areas on the left leg medially laterally posteriorly. He does not have any evidence of active cellulitis especially involving the left Achilles The patient is traveling from [facility] Saturday going to [name]. He states he isn't attempting to get an appointment with a heel objects wound [name] there to change his dressings. I am not completely certain whether this will work 10/06/17; the patient came in on Friday for a nurse visit and the nurse reported that his legs actually look  quite good. He arrives in clinic today for his regular follow-up visit. He has a new wound on his left third toe over the PIP probably caused by friction with his footwear. He has small areas on the left leg and a very superficial but epithelialized area on the right anterior lateral lower leg. Other than that his legs look as good as I've seen him in quite some time. We have been using silver alginate Review of systems; no chest pain no shortness of breath other than this a 10 point review of systems negative 10/20/17; seen by [name] last week. He had taken some antibiotics [doxycycline] that he had left over. [name] thought he had candida infection and declined to give him further antibiotics. He has a small wound remaining on the right lateral leg several areas on the left leg including a larger area on the left posterior several left medial and anterior and a small wound on the left lateral. The area on the left dorsal third toe looks a lot better. ROS; Gen.; no fever, respiratory no cough no sputum Cardiac no chest pain other than this 10 point review of system is negative 10/30/17; patient arrives today having fallen in the bathtub 3 days ago. It took him a while to get up. He has pain and maceration in the  wounds on his left leg which have deteriorated. He has not been using his pumps he also has some maceration on the right lateral leg. 11/03/17; patient continues to have weeping edema especially in the left leg. This saturates his dressings which were just put on on 12/27. As usual the doxycycline seems to take care of the cellulitis on his lower leg. He is not complaining of fever, chills, or other systemic symptoms. He states his leg feels a lot better on the doxycycline I gave him empirically. He also apparently gets injections at his primary doctor's officeo Rocephin for cellulitis prophylaxis. I didn't ask him about his compression pump compliance today I think that's probably  marginal. Arrives in the clinic with all of his dressings primary and secondary macerated full of fluid and he has bilateral edema 11/10/17; the patient's right leg looks some better although there is still a cluster of wounds on the right lateral. The left leg is inflamed with almost circumferential skin loss medially to laterally although we are still maintaining anteriorly. He does not have overt cellulitis there is a lot of drainage. He is not using compression pumps. We have been using silver alginate to the wound areas, there are not a lot of options here 11/17/17; the patient's right leg continues to be stable although there is still open wounds, better than last week. The inflammation in the left leg is better. Still loss of surface layer epithelium especially posteriorly. There is no overt cellulitis in the amount of edema and his left leg is really quite good, tells me he is using his compression pumps once a day. 11/24/17; patient's right leg has a small superficial wound laterally this continues to improve. The inflammation in the left leg is still improving however we have continuous surface layer epithelial loss posteriorly. There is no overt cellulitis in the amount of edema in both legs is really quite good. He states he is using his compression pumps on the left leg once a day for 5 out of 7 days 12/01/17; very small superficial areas on the right lateral leg continue to improve. Edema control in both legs is better today. He has continued loss of surface epithelialization and left posterior calf although I think this is better. We have been using silver alginate with large number of absorptive secondary dressings 4 layer on the left Unna boot on the right at his request. He tells me he is using his compression pumps once a day 12/08/17; he has no open area on the right leg is edema control is good here. On the left leg however he has marked erythema and tenderness breakdown of skin. He has  what appears to be a wrap injury just distal to the popliteal fossa. 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 There was swelling erythema and breakdown in the right leg. He has much worse skin breakdown in the left leg as well multiple open areas medially and posteriorly as well as laterally. He tells me he has been using his compression pumps but tells me he feels that the drainage out of his leg is worse when he uses a compression pumps. T be fair to him he is been saying this o for a  while however I don't know that I have really been listening to this. I wonder if the compression pumps are working properly 12/22/17;. Once again he arrives with severe erythema, weeping edema from the left greater than right leg. Noncompliance with compression pumps. New this visit he is complaining of pain on the lateral aspect of the right leg and the medial aspect of his right thigh. He apparently saw his cardiologist [name] who was ordered an echocardiogram area and I think this is a step in the right direction 12/25/17; started his doxycycline Monday night. There is still intense erythema of the right leg especially in the anterior thigh although there is less tenderness. The erythema around the wound on the right lateral calf also is less tender. He still complaining of pain in the left heel. His wounds are about the same right lateral left medial left lateral. Superficial but certainly not close to closure. He denies being systemically unwell no fever chills no abdominal pain no diarrhea 12/29/17; back in follow-up of his extensive right calf and right thigh cellulitis. I added amoxicillin to cover possible doxycycline resistant  strep. This seems to of done the trick he is in much less pain there is much less erythema and swelling. He has his echocardiogram at 11:00 this morning. X-ray of the left heel was also negative. 01/05/18; the patient arrived with his edema under much better control. Now that he is retired he is able to use his compression pumps daily and sometimes twice a day per the patient. He has a wound on the right leg the lateral wound looks better. Area on the left leg also looks a lot better. He has no evidence of cellulitis in his bilateral thighs I had a quick peak at his echocardiogram. He is in normal ejection fraction and normal left ventricular function. He has moderate pulmonary hypertension moderately reduced right ventricular function. One would have to wonder about chronic sleep apnea although he says he doesn't snore. He'll review the echocardiogram with his cardiologist. 01/12/18; the patient arrives with the edema in both legs under exemplary control. He is using his compression pumps daily and sometimes twice daily. His wound on the right lateral leg is just about closed. He still has some weeping areas on the posterior left calf and lateral left calf although everything is just about closed here as well. I have spoken with Aldean Baker who is the patient's nurse practitioner and infectious disease. She was concerned that the patient had not understood that the parenteral penicillin injections he was receiving for cellulitis prophylaxis was actually benefiting him. I don't think the patient actually saw that I would tend to agree we were certainly dealing with less infections although he had a serious one last month. 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 He is healed. He'll be placed in juxtalite compression wraps and increase his lymphedema pumps to twice daily. We will follow up again next week to ensure there are no issues with the new  regiment. 01/20/18-he is here for evaluation of bilateral lower extremity weeping edema. Yesterday he was placed in compression wrap to the right lower extremity and compression stocking to left lower shrubbery. He states he uses lymphedema pumps last night and again this morning and noted a blister to the left lower extremity. On exam he was noted to have drainage to the right lower extremity. He will be placed in Unna boots bilaterally and follow-up next week 01/26/18; patient was  actually discharged a week ago to his own juxta light stockings only to return the next day with bilateral lower extremity weeping edema.he was placed in bilateral Unna boots. He arrives today with pain in the back of his left leg. There is no open area on the right leg however there is a linear/wrap injury on the left leg and weeping edema on the left leg posteriorly. I spoke with infectious disease about 10 days ago. They were disappointed that the patient elected to discontinue prophylactic intramuscular penicillin shots as they felt it was particularly beneficial in reducing the frequency of his cellulitis. I discussed this with the patient today. He does not share this view. He'll definitely need antibiotics today. Finally he is traveling to [place] and trauma leaving this Saturday and returning a week later and he does not travel with his pumps. He is going by car 01/30/18; patient was seen 4 days ago and brought back in today for review of cellulitis in the left leg posteriorly. I put him on amoxicillin this really hasn't helped as much as I might like. He is also worried because he is traveling to [name] trauma by car. Finally we will be rewrapping him. There is no open area on the right leg over his left leg has multiple weeping areas as usual 02/09/18; The same wrap on for 10 days. He did not pick up the last doxycycline I prescribed for him. He apparently took 4 days worth he already had. There is nothing open on  his right leg and the edema control is really quite good. He's had damage in the left leg medially and laterally especially probably related to the prolonged use of Unna boots 02/12/18; the patient arrived in clinic today for a nurse visit/wrap change. He complained of a lot of pain in the left posterior calf. He is taking doxycycline that I previously prescribed for him. Unfortunately even though he used his stockings and apparently used to compression pumps twice a day he has weeping edema coming out of the lateral part of his right leg. This is coming from the lower anterior lateral skin area. 02/16/18; the patient has finished his doxycycline and will finish the amoxicillin 2 days. The area of cellulitis in the left calf posteriorly has resolved. He is no longer having any pain. He tells me he is using his compression pumps at least once a day sometimes twice. 02/23/18; the patient finished his doxycycline and Amoxil last week. On Friday he noticed a small erythematous circle about the size of a quarter on the left lower leg just above his ankle. This rapidly expanded and he now has erythema on the lateral and posterior part of the thigh. This is bright red. Also has an area on the dorsal foot just above his toes and a tender area just below the left popliteal fossa. He came off his prophylactic penicillin injections at his own insistence one or 2 months ago. This is obviously deteriorated since then 03/02/18; patient is on doxycycline and Amoxil. Culture I did last week of the weeping area on the back of his left calf grew group B strep. I have therefore renewed the amoxicillin 500 3 times a day for a further week. He has not been systemically unwell. Still complaining of an area of discomfort right under his left popliteal fossa. There is no open wound on the right leg. He tells me that he is using his pumps twice a day on most days 03/09/18; patient arrives in  clinic today completing his amoxicillin  today. The cellulitis on his left leg is better. Furthermore he tells me that he had intramuscular penicillin shots that his primary care office today. However he also states that the wrap on his right leg fell down shortly after leaving clinic last week. He developed a large blister that was present when he came in for a nurse visit later in the week and then he developed intense discomfort around this area.He tells me he is using his compression pumps 03/16/18; the patient has completed his doxycycline. The infectious part of this/cellulitis in the left heel area left popliteal area is a lot better. He has 2 open areas on the right calf. Still areas on the left calf but this is a lot better as well. 03/24/18; the patient arrives complaining of pain in the left popliteal area again. He thinks some of this is wrap injury. He has no open area on the right leg and really no open area on the left calf either except for the popliteal area. He claims to be compliant with the compression pumps 03/31/18; I gave him doxycycline last week because of cellulitis in the left popliteal area. This is a lot better although the surface epithelium is denuded off and response to this. He arrives today with uncontrolled edema in the right calf area as well as a fingernail injury in the right lateral calf. There is only a few open areas on the left 04/06/18; I gave him amoxicillin doxycycline over the last 2 weeks that the amoxicillin should be completing currently. He is not complaining of any pain or systemic symptoms. The only open areas see has is on the right lateral lower leg paradoxically I cannot see anything on the left lower leg. He tells me he is using his compression pumps twice a day on most days. Silver alginate to the wounds that are open under 4 layer compression 04/13/18; he completed antibiotics and has no new complaints. Using his compression pumps. Silver alginate that anything that's opened 04/20/18; he is  using his compression pumps religiously. Silver alginate 4 layer compression anything that's opened. He comes in today with no open wounds on the left leg but 3 on the right including a new one posteriorly. He has 2 on the right lateral and one on the right posterior. He likes Unna boots on the right leg for reasons that aren't really clear we had the usual 4 layer compression on the left. It may be necessary to move to the 4 layer compression on the right however for now I left them in the Unna boots 04/27/18; he is using his compression pumps at least once a day. He has still the wounds on the right lateral calf. The area right posteriorly has closed. He does not have an open wound on the left under 4 layer compression however on the dorsal left foot just proximal to the toes and the left third toe 2 small open areas were identified 05/11/18; he has not uses compression pumps. The areas on the right lateral calf have coalesced into one large wound necrotic surface. On the left side he has one small wound anteriorly however the edema is now weeping out of a large part of his left leg. He says he wasn't using his pumps because of the weeping fluid. I explained to him that this is the time he needs to pump more 05/18/18; patient states he is using his compression pumps twice a day. The area on  the right lateral large wound albeit superficial. On the left side he has innumerable number of small new wounds on the left calf particularly laterally but several anteriorly and medially. All these appear to have healthy granulated base these look like the remnants of blisters however they occurred under compression. The patient arrives in clinic today with his legs somewhat better. There is certainly less edema, less multiple open areas on the left calf and the right anterior leg looks somewhat better as well superficial and a little smaller. However he relates pain and erythema over the last 3-4 days in the thigh  and I looked at this today. He has not been systemically unwell no fever no chills no change in blood sugar values 05/25/18; comes in today in a better state. The severe cellulitis on his left leg seems better with the Keflex. Not as tender. He has not been systemically unwell Hard to find an open wound on the left lower leg using his compression pumps twice a day The confluent wounds on his right lateral calf somewhat better looking. These will ultimately need debridement I didn't do this today. 06/01/18; the severe cellulitis on the left anterior thigh has resolved and he is completed his Keflex. There is no open wound on the left leg however there is a superficial excoriation at the base of the third toe dorsally. Skin on the bottom of his left foot is macerated looking. The left the wounds on the lateral right leg actually looks some better although he did require debridement of the top half of this wound area with an open curet 06/09/18 on evaluation today patient appears to be doing poorly in regard to his right lower extremity in particular this appears to likely be infected he has very thick purulent discharge along with a bright green tent to the discharge. This makes me concerned about the possibility of pseudomonas. He's also having increased discomfort at this point on evaluation. Fortunately there does not appear to be any evidence of infection spreading to the other location at this time. 06/16/18 on evaluation today patient appears to actually be doing fairly well. His ulcer has actually diminished in size quite significantly at this point which is good news. Nonetheless he still does have some evidence of infection he did see infectious disease this morning before coming here for his appointment. I did review the results of their evaluation and their note today. They did actually have him discontinue the Cipro and initiate treatment with linezolid at this time. He is doing this for the  next seven days and they recommended a follow-up in four months with them. He is the keep a log of the need for intermittent antibiotic therapy between now and when he falls back up with infectious disease. This will help them gaze what exactly they need to do to try and help them out. 06/23/18; the patient arrives today with no open wounds on the left leg and left third toe healed. He is been using his compression pumps twice a day. On the right lateral leg he still has a sizable wound but this is a lot better than last time I saw this. 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 Has been using silver alginate under 4 layer compression 06/30/18; the only open wound he has is on the right lateral leg and this looks healthy. No debridement is required. We have  been using silver alginate. He does not have an open wound on the left leg. There is apparently some drainage from the dorsal proximal third toe on the left although I see no open wound here. 07/03/18 on evaluation today patient was actually here just for a nurse visit rapid change. However when he was here on Wednesday for his rat change due to having been healed on the left and then developing blisters we initiated the wrap again knowing that he would be back today for [name] to reevaluate and see were at. Unfortunately he has developed some cellulitis into the proximal portion of his right lower extremity even into the region of his thigh. 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 He was placed on one as what at that point. Nonetheless he is done with that and has been tolerating it well otherwise. Doxycycline which in the past really did not seem to be effective for him. Nonetheless I think the best option may be for [name] to definitely reinitiate the antibiotics for a longer period of time. 07/07/18; since I  last saw this patient a week ago he has had a difficult time. At that point he did not have an open wound on his left leg. We transitioned him into juxta light stockings. He was apparently in the clinic the next day with blisters on the left lateral and left medial lower calf. He also had weeping edema fluid. He was put back into a compression wrap. He was also in the clinic on Friday with intense erythema in his right thigh. Per the patient he was started on Bactrim however that didn't work at all in terms of relieving his pain and swelling. He has taken 3 doxycycline that he had left over from last time and that seems to of helped. He has blistering on the right thigh as well. 07/14/18; the erythema on his right thigh has gotten better with doxycycline that he is finishing. 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 Presumably this cellulitis in the thigh was not related to that although I think this is a bit concerning going forward. He still has an area on the right lateral calf the blister on the right medial calf just below the knee that was discussed above. On the left 2 small open areas left medial and left lateral. Edema control is adequate. He is using his compression pumps twice a day 07/20/18; continued improvement in the condition of both legs especially the edema in his bilateral thighs. He tells me he is been losing weight through a combination of diet and exercise. He is using his compression pumps twice a day. So overall she made to the remaining wounds 07/27/2018; continued improvement in condition of both legs. His edema is well controlled. The area on the right lateral leg is just about closed he had one blisters show up on the medial left upper calf. We have him in 4 layer compression. He is going on a 10-day trip to [place], [place] and [place]. He will be driving. He wants to wear Unna boots because of  the lessening amount of constriction. He will not use compression pumps while he is away 08/05/18 on evaluation today patient actually appears to be doing decently well all things considered in regard to his bilateral lower extremities. The worst ulcer is actually only posterior aspect of  his left lower extremity with a four layer compression wrap cut into his leg a couple weeks back. He did have a trip and actually had [employer] for the trip that he is worn since he was last here. Nonetheless he feels like the [employer] actually do better for him his swelling is up a little bit but he also with his trip was not taking his Lasix on a regular set schedule like he was supposed to be. He states that obviously the reason being that he cannot drive and keep going without having to urinate too frequently which makes it difficult. He did not have his pumps with him while he was away either which I think also maybe playing a role here too. 08/13/2018; the patient only has a small open wound on the right lateral calf which is a big improvement in the last month or 2. He also has the area posteriorly just below the posterior fossa on the left which I think was a wrap injury from several weeks ago. He has no current evidence of cellulitis. He tells me he is back into his compression pumps twice a day. He also tells me that while he was at the laundromat somebody stole a section of his extremitease stockings 08/20/2018; back in the clinic with a much improved state. He only has small areas on the right lateral mid calf which is just about healed. This was is more substantial area for quite a prolonged period of time. He has a small open area on the left anterior tibia. The area on the posterior calf just below the popliteal fossa is closed today. He is using his compression pumps twice a day 08/28/2018; patient has no open wound on the right leg. He has a smattering of open areas on the calf with some  weeping lymphedema. More problematically than that it looks as though his wraps of slipped down in his usual he has very angry upper area of edema just below the right medial knee and on the right lateral calf. He has no open area on his feet. The patient is traveling to [name] next week. I will send him in an antibiotic. We will continue to wrap the right leg. We ordered extremitease stockings for him last week and I plan to transition the right leg to a stocking when he gets home which will be in 10 days time. As usual he is very reluctant to take his pumps with him when he travels 09/07/2018; patient returns from [name]. He shows me a picture of his left leg in the mid part of his trip last week with intense fire engine erythema. The picture look bad enough I would have considered sending him to the [name]. Instead he went to the [name] They did not prescribe him antibiotics but he did take some doxycycline he had leftover from a previous visit. I had given him trimethoprim sulfamethoxazole before he left this did not work according to the patient. This is resulted in some improvement fortunately. He comes back with a large wound on the left posterior calf. Smaller area on the left anterior tibia. Denuded blisters on the dorsal left foot over his toes. Does not have much in the way of wounds on the right leg although he does have a very tender area on the right posterior area just below the popliteal fossa also suggestive of infection. He promises me he is back on his  pumps twice a day 09/15/2018; the intense cellulitis in his left lower calf is a lot better. The wound area on the posterior left calf is also so better. However he has reasonably extensive wounds on the dorsal aspect of his second and third toes and the proximal foot just at the base of the toes. There is nothing open on the right leg 09/22/2018; the patient has excellent edema  control in his legs bilaterally. He is using his external compression pumps twice a day. He has no open area on the right leg and only the areas in the left foot dorsally second and third toe area on the left side. He does not have any signs of active cellulitis. 10/06/2018; the patient has good edema control bilaterally. He has no open wound on the right leg. There is a blister in the posterior aspect of his left calf that we had to deal with today. He is using his compression pumps twice a day. There is no signs of active cellulitis. We have been using silver alginate to the wound areas. He still has vulnerable areas on the base of his left first second toes dorsally He has a his extremities stockings and we are going to transition him today into the stocking on the right leg. He is cautioned that he will need to continue to use the compression pumps twice a day. If he notices uncontrolled edema in the right leg he may need to go to 3 times a day. 10/13/2018; the patient came in for a nurse check on Friday he has a large flaccid blister on the right medial calf just below the knee. We unroofed this. He has this and a new area underneath the posterior mid calf which was undoubtedly a blister as well. He also has several small areas on the right which is the area we put his extremities stocking on. 10/19/2018; the patient went to see infectious disease this morning I am not sure if that was a routine follow-up in any case the doxycycline I had given him was discontinued and started on linezolid. He has not started this. It is easy to look at his left calf and the inflammation and think this is cellulitis however he is very tender in the tissue just below the popliteal fossa and I have no doubt that there is infection going on here. He states the problem he is having is that with the compression pumps the edema goes down and then starts walking the wrap falls down. We will see if we can adhere this. He  has 1 or 2 minuscule open areas on the right still areas that are weeping on the posterior left calf, the base of his left second and third toes 10/26/18; back today in clinic with quite of skin breakdown in his left anterior leg. This may have been infection the area below the popliteal fossa seems a lot better however tremendous epithelial loss on the left anterior mid tibia area over quite inexpensive tissue. He has 2 blisters on the right side but no other open wound here. 10/29/2018; came in urgently to see [name] today and we worked him in for review. He states that the 4 layer compression on the right leg caused pain he had to cut it down to roughly his mid calf this caused swelling above the wrap and he has blisters and skin breakdown today. As a result of the pain he has not been using his pumps. Both legs are a lot more  edematous and there is a lot of weeping fluid. 11/02/18; arrives in clinic with continued difficulties in the right leg> left. Leg is swollen and painful. multiple skin blisters and new open areas especially laterally. He has not been using his pumps on the right leg. 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 He is not systemically unwell. 11/09/2018; the patient claims he is being compliant with his pumps. He is finished the doxycycline I gave him last week. Culture I did of the wound on the right lateral leg showed a few very resistant methicillin staph aureus. This was resistant to doxycycline. Nevertheless he states the pain in the leg is a lot better which makes me wonder if the cultured organism was not really what was causing the problem nevertheless this is a very dangerous organism to be culturing out of any wound. His right leg is still a lot larger than the left. He is using an Radio broadcast assistant on this area, he blames a 4-layer compression for causing the original skin breakdown which I doubt is true however I cannot talk him out  of it. We have been using silver alginate to all of these areas which were initially blisters 11/16/2018; patient is being compliant with his external compression pumps at twice a day. Miraculously he arrives in clinic today with absolutely no open wounds. He has better edema control on the left where he has been using 4 layer compression versus wound of wounds on the right and I pointed this out to him. There is no inflammation in the skin in his lower legs which is also somewhat unusual for him. There is no open wounds on the dorsal left foot. He has extremitease stockings at home and I have asked him to bring these in next week. 11/25/18 patient's lower extremity on examination today on the left appears for the most part to be wound free. He does have an open wound on the lateral aspect of the right lower extremity but this is minimal compared to what I've seen in past. He does request that we go ahead and wrap the left leg as well even though there's nothing open just so hopefully it will not reopen in short order. 1/28; patient has superficial open wounds on the right lateral calf left anterior calf and left posterior calf. His edema control is adequate. 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 We have been using silver alginate as the primary dressing. He claims compliance with his compression pumps 2/4; patient has superficial open wounds on numerous areas of his left calf and again one on the left dorsal foot. The areas on the right lateral calf have healed. The cellulitis that I gave him doxycycline for last week is also resolved this was mostly on the left anterior calf just below the tibial tuberosity. His edema looks fairly well-controlled. He tells me he went to see his primary doctor today and had blood work ordered 2/11; once again he has several open areas on the left calf left tibial area. Most of  these are small and appear to have healthy granulation. He does not have anything open on the right. The edema and control in his thighs is pretty good which is usually a good indication he has been using his pumps as requested. 2/18; he continues to have several small areas on the left calf and left tibial  area. Most of these are small healthy granulation. We put him in his stocking on the right leg last week and he arrives with a superficial open area over the right upper tibia and a fairly large area on the right lateral tibia in similar condition. His edema control actually does not look too bad, he claims to be using his compression pumps twice a day 2/25. Continued small areas on the left calf and left tibial area. New areas especially on the right are identified just below the tibial tuberosity and on the right upper tibia itself. There are also areas of weeping edema fluid even without an obvious wound. He does not have a considerable degree of lymphedema but clearly there is more edema here than his skin can handle. He states he is using the pumps twice a day. We have an Unna boot on the right and 4 layer compression on the left. 3/3; he continues to have an area on the right lateral calf and right posterior calf just below the popliteal fossa. There is a fair amount of tenderness around the wound on the popliteal fossa but I did not see any evidence of cellulitis, could just be that the wrap came down and rubbed in this area. He does not have an open area on the left leg however there is an area on the left dorsal foot at the base of the third toe We have been using silver alginate to all wound areas 3/10; he did not have an open area on his left leg last time he was here a week ago. T oday he arrives with a horizontal wound just below the tibial tuberosity and an area on the left lateral calf. He has intense erythema and tenderness in this area. The area is on the right lateral calf and  right posterior calf better than last week. We have been using silver alginate as usual 3/18 - Patient returns with 3 small open areas on left calf, and 1 small open area on right calf, the skin looks ok with no significant erythema, he continues the UNA boot on right and 4 layer compression on left. The right lateral calf wound is closed , the right posterior is small area. we will continue silver alginate to the areas. 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 He is been tolerating the dressing changes without complication. Fortunately she has made excellent improvement in regard to the overall status of his wounds. Unfortunately every time we cease wrapping him he ends up reopening in causing more significant issues at that point. Again I'm unsure of the best direction to take although I think the lymphedema clinic may be appropriate for him. 02/03/19 on evaluation today patient appears to be doing well in regard to the wounds that we saw him for last week unfortunately he has a new area on the proximal portion of his right medial/posterior lower extremity where the wrap somewhat slowed down and caused swelling and a blister to rub and open. Unfortunately this is the only opening that he has on either leg at this point. 02/17/19 on evaluation today patient's bilateral lower extremities appear to be doing well. He still completely healed in regard to the left lower extremity. In regard to the right lower extremity the area where the wrap and slid down  and caused the blister still seems to be slightly open although this is dramatically better than during the last evaluation two weeks ago. I'm very pleased with the way this stands overall. 03/03/19 on evaluation today patient appears to be doing well in regard  to his right lower extremity in general although he did have a new blister open this does not appear to be showing any evidence of active infection at this time. Fortunately there's No fevers, chills, nausea, or vomiting noted at this time. Overall I feel like he is making good progress it does feel like that the right leg will we perform the [employer] seems to do with a bit better than three layer wrap on the left which slid down on him. We may switch to doing bilateral in the book wraps. 5/4; I have not seen Mr. Goodenow in quite some time. According to our case manager he did not have an open wound on his left leg last week. He had 1 remaining wound on the right posterior medial calf. He arrives today with multiple openings on the left leg probably were blisters and/or wrap injuries from Unna boots. I do not think the Unna boot's will provide adequate compression on the left. I am also not clear about the frequency he is using the compression pumps. 03/17/19 on evaluation today patient appears to be doing excellent in regard to his lower extremities compared to last week's evaluation apparently. He had gotten significantly worse last week which is unfortunate. The [employer] wrap on the left did not seem to do very well for him at all and in fact it didn't control his swelling significantly enough he had an additional outbreak. Subsequently we go back to the four layer compression wrap on the left. This is good news. At least in that he is doing better and the wound seem to be killing him. He still has not heard anything from the lymphedema clinic. 03/24/19 on evaluation today patient actually appears to be doing much better in regard to his bilateral lower Trinity as compared to last week when I saw him. Fortunately there's no signs of active infection at this time. He has been tolerating the dressing changes without complication. Overall I'm extremely pleased with the progress and appearance in  general. 04/07/19 on evaluation today patient appears to be doing well in regard to his bilateral lower extremities. His swelling is significantly down from where it was previous. With that being said he does have a couple blisters still open at this point but fortunately nothing that seems to be too severe and again the majority of the larger openings has healed at this time. 04/14/19 on evaluation today patient actually appears to be doing quite well in regard to his bilateral lower extremities in fact I'm not even sure there's anything significantly open at this time at any site. Nonetheless he did have some trouble with these wraps where they are somewhat irritating him secondary to the fact that he has noted that the graph wasn't too close down to the end of this foot in a little bit short as well up to his knee. Otherwise things seem to be doing quite well. 04/21/19 upon evaluation today patient's wound bed actually showed evidence of being completely healed in regard to both lower extremities which is excellent news. There does not appear to be any signs of active infection which is also good news. I'm very pleased in this regard. No fevers, chills, nausea, or vomiting noted  at this time. 04/28/19 on evaluation today patient appears to be doing a little bit worse in regard to both lower extremities on the left mainly due to the fact that when he went infection disease the wrap was not wrapped quite high enough he developed a blister above this. On the right he is a small open area of nothing too significant but again this is continuing to give him some trouble he has been were in the Velcro compression that he has at home. 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 He's been tolerating the dressing changes without complication. Fortunately there's no signs of active infection at this time. No fevers, chills, nausea, or vomiting noted at  this time. We have been trying to get an appointment with her lymphedema clinic in [name] but unfortunately nobody can get them on phone with not been able to even fax information over the patient likewise is not been able to get in touch with them. Overall I'm not sure exactly what's going on here with to reach out again today. 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 Still having a lot of drainage unfortunately. He tells me especially in the left but even on the right. There's no signs of active infection which is good news we've been using so ratcheted up to this point. 05/19/19 on evaluation today patient actually appears to be doing quite well with regard to his left lower extremity which is great news. Fortunately in regard to the right lower extremity has an issues with his wrap and he subsequently did remove this from what I'm understanding. Nonetheless long story short is what he had rewrapped once he removed it subsequently had maggots underneath this wrap whenever he came in for evaluation today. With that being said they were obviously completely cleaned away by the nursing staff. The visit today which is excellent news. However he does appear to potentially have some infection around the right ankle region where the maggots were located as well. He will likely require anabiotic therapy today. 05/26/19 on evaluation today patient actually appears to be doing much better in regard to his bilateral lower extremities. I feel like the infection is under much better control. With that being said there were maggots noted when the wrap was removed yet again today. Again this could have potentially been left over from previous although at this time there does not appear to be any signs of significant drainage there was obviously on the wrap some drainage as well this contracted gnats or  otherwise. Either way I do not see anything that appears to be doing worse in my pinion and in fact I think his drainage has slowed down quite significantly likely mainly due to the fact to his infection being under better control. 06/02/2019 on evaluation today patient actually appears to be doing well with regard to his bilateral lower extremities there is no signs of active infection at this time which is great news. With that being said he does have several open areas more so on the right than the left but nonetheless these are all significantly better than previously noted. 06/09/2019 on evaluation today patient actually appears to be doing well. His wrap stayed up and he did not cause any problems he had more drainage on the right compared to the left but overall I do not see  any major issues at this time which is great news. 06/16/2019 on evaluation today patient appears to be doing excellent with regard to his lower extremities the only area that is open is a new blister that can have opened as of today on the medial ankle on the left. Other than this he really seems to be doing great I see no major issues at this point. 06/23/2019 on evaluation today patient appears to be doing quite well with regard to his bilateral lower extremities. In fact he actually appears to be almost completely healed there is a small area of weeping noted of the right lower extremity just above the ankle. Nonetheless fortunately there is no signs of active infection at this time which is good news. No fevers, chills, nausea, vomiting, or diarrhea. 8/24; the patient arrived for a nurse visit today but complained of very significant pain in the left leg and therefore I was asked to look at this. Noted that he did not have an open area on the left leg last week nevertheless this was wrapped. The patient states that he is not been able to put his compression pumps on the left leg because of the discomfort. He has not been  systemically unwell 06/30/2019 on evaluation today patient unfortunately despite being excellent last week is doing much worse with regard to his left lower extremity today. In fact he had to come in for a nurse on Monday where his left leg had to be rewrapped due to excessive weeping [name] placed him on doxycycline at that point. Fortunately there is no signs of active infection Systemically at this time which is good news. 07/07/2019 in regard to the patient's wounds today he actually seems to be doing well with his right lower extremity there really is nothing open or draining at this point this is great news. Unfortunately the left lower extremity is given him additional trouble at this time. There does not appear to be any signs of active infection nonetheless he does have a lot of edema and swelling noted at this point as well as blistering all of which has led to a much more poor appearing leg at this time compared to where it was 2 weeks ago when it was almost completely healed. Obviously this is a little discouraging for the patient. He is try to contact the lymphedema clinic in [place] he has not been able to get through to them. 07/14/2019 on evaluation today patient actually appears to be doing slightly better with regard to his left lower extremity ulcers. Overall I do feel like at least at the top of the wrap that we have been placing this area has healed quite nicely and looks much better. The remainder of the leg is showing signs of improvement. Unfortunately in the thigh area he still has an open region on the left and again on the right he has been utilizing just a Band-Aid on an area that also opened on the thigh. Again this is an area that were not able to wrap although we did do an Ace wrap to provide some compression that something that obviously is a little less effective than the compression wraps we have been using on the lower portion of the leg. He does have an  appointment with the lymphedema clinic in [name] on Friday. 07/21/2019 on evaluation today patient appears to be doing better with regard to his lower extremity ulcers. He has been tolerating the dressing changes without complication. Fortunately there is  no signs of active infection at this time. No fevers, chills, nausea, vomiting, or diarrhea. I did receive the paperwork from the physical therapist at the lymphedema clinic in [place]. Subsequently I signed off on that this morning and sent that back to him for further progression with the treatment plan. 07/28/2019 on evaluation today patient appears to be doing very well with regard to his right lower extremity where I do not see any open wounds at this point. Fortunately he is feeling great as far as that is concerned as well. In regard to the left lower extremity he has been having issues with still several areas of weeping and edema although the upper leg is doing better his lower leg still I think is going require the compression wrap at this time. No fevers, chills, nausea, vomiting, or diarrhea. 08/04/2019 on evaluation today patient unfortunately is having new wounds on the right lower extremity. Again we have been using Unna boot wrap on that side. We switched him to using his juxta lite wrap at home. With that being said he tells me he has been using it although his legs extremely swollen and to be honest really does not appear that he has been. I cannot know that for sure however. Nonetheless he has multiple new wounds on the right lower extremity at this time. Obviously we will have to see about getting this rewrapped for him today. 08/11/2019 on evaluation today patient appears to be doing fairly well with regard to his wounds. He has been tolerating the dressing changes including the compression wraps without complication. He still has a lot of edema in his upper thigh regions bilaterally he is supposed to be seeing the  lymphedema clinic on the 15th of this month once his wraps arrive for the upper part of his legs. 08/18/2019 on evaluation today patient appears to be doing well with regard to his bilateral lower extremities at this point. He has been tolerating the dressing changes without complication. Fortunately there is no signs of active infection which is also good news. He does have a couple weeping areas on the first and second toe of the right foot he also has just a small area on the left foot upper leg and a small area on the left lower leg but overall he is doing quite well in my opinion. He is supposed to be getting his wraps shortly in fact tomorrow and then subsequently is seeing the lymphedema clinic next Wednesday on the 21st. Of note he is also leaving on the 25th to go on vacation for a [place] to the beach. For that reason and since there is some uncertainty about what there can be doing at lymphedema clinic next Wednesday I am get a make an appointment for next Friday here for [name] to see what we need to do for him prior to him leaving for vacation. 10/23; patient arrives in considerable pain predominantly in the upper posterior calf just distal to the popliteal fossa also in the wound anteriorly above the major wound. This is probably cellulitis and he has had this recurrently in the past. He has no open wound on the right side and he has had an Radio broadcast assistant in that area. Finally I note that he has an area on the left posterior calf which by enlarge is mostly epithelialized. This protrudes beyond the borders of the surrounding skin in the setting of dry scaly skin and lymphedema. The patient is leaving for [place] on Sunday. Per  his longstanding pattern, he will not take his compression pumps with him predominantly out of fear that they will be stolen. He therefore asked that we put a Unna boot back on the right leg. He will also contact the wound care [name] in [name] to see if they can  change his dressing in the mid week. 11/3; patient returned from his vacation to [name]. He was seen on 1 occasion at their wound care [name]. They did a 2 layer compression system as they did not have our 4-layer wrap. I am not completely certain what they put on the wounds. They did not change the Unna boot on the right. The patient is also seeing a lymphedema specialist physical therapist in [place]. It appears that he has some compression sleeve for his thighs which indeed look quite a bit better than I am used to seeing. He pumps over these with his external compression pumps. 11/10; the patient has a new wound on the right medial thigh otherwise there is no open areas on the right. He has an area on the left leg posteriorly anteriorly and medially and an area over the left second toe. We have been using silver alginate. He thinks the injury on his thigh is secondary to friction from the compression sleeve he has. 11/17; the patient has a new wound on the right medial thigh last week. He thinks this is because he did not have a underlying stocking for his thigh juxta lite apparatus. He now has this. The area is fairly large and somewhat angry but I do not think he has underlying cellulitis. He has a intact blister on the right anterior tibial area. Small wound on the right great toe dorsally Small area on the medial left calf. 11/30; the patient does not have any open areas on his right leg and we did not take his juxta lite stocking off. However he states that on Friday his compression wrap fell down lodging around his upper mid calf area. As usual this creates a lot of problems for him. He called urgently today to be seen for a nurse visit however the nurse visit turned into a provider visit because of extreme erythema and pain in the left anterior tibia extending laterally and posteriorly. The area that is problematic is extensive 10/06/2019 upon evaluation today patient actually  appears to be doing poorly in regard to his left lower extremity. He [name] did place him on doxycycline this past Monday apparently due to the fact that he was doing much worse in regard to this left leg. Fortunately the doxycycline does seem to be helping. Unfortunately we are still having a very difficult time getting his edema under any type of control in order to anticipate discharge at some point. The only way were really able to control his lymphedema really is with compression wraps and that has only even seemingly temporary. He has been seeing a lymphedema clinic they are trying to help in this regard but still this has been somewhat frustrating in general for the patient. 10/13/19 on evaluation today patient appears to be doing excellent with regard to his right lower extremity as far as the wounds are concerned. His swelling is still quite extensive unfortunately. He is still having a lot of drainage from the thigh areas bilaterally which is unfortunate. He's been going to lymphedema clinic but again he still really does not have this edema under control as far as his lower extremities are concern. With regard  to his left lower extremity this seems to be improving and I do believe the doxycycline has been of benefit for him. He is about to complete the doxycycline. 10/20/2019 on evaluation today patient appears to be doing poorly in regard to his bilateral lower extremities. More in the right thigh he has a lot of irritation at this site unfortunately. In regard to the left lower extremity the wrap was not quite as high it appears and does seem to have caused him some trouble as well. Fortunately there is no evidence of systemic infection though he does have some blue-green drainage which has me concerned for the possibility of Pseudomonas. He tells me he is previously taking Cipro without complications and he really does not care for Levaquin however due to some of the side effects he  has. He is not allergic to any medications specifically antibiotics that were aware of. 10/27/2019 on evaluation today patient actually does appear to be for the most part doing better when compared to last week's evaluation. With that being said he still has multiple open wounds over the bilateral lower extremities. He actually forgot to start taking the Cipro and states that he still has the whole bottle. He does have several new blisters on left lower extremity today I think I would recommend he go ahead and take the Cipro based on what I am seeing at this point. 12/30-Patient comes at 1 week visit, 4 layer compression wraps on the left and Unna boot on the right, primary dressing Xtrasorb and silver alginate. Patient is taking his Cipro and has a few more days left probably 5-6, and the legs are doing better. He states he is using his compressions devices which I believe he has 11/10/2019 on evaluation today patient actually appears to be much better than last time I saw him 2 weeks ago. His wounds are significantly improved and overall I am very pleased in this regard. Fortunately there is no signs of active infection at this time. He is just a couple of days away from completing Cipro. Overall his edema is much better he has been using his lymphedema pumps which I think is also helping at this point. 11/17/2019 on evaluation today patient appears to be doing excellent in regard to his wounds in general. His legs are swollen but not nearly as much as they have been in the past. Fortunately he is tolerating the compression wraps without complication. No fevers, chills, nausea, vomiting, or diarrhea. He does have some erythema however in the distal portion of his right lower extremity specifically around the forefoot and toes there is a little bit of warmth here as well. 11/24/2019 on evaluation today patient appears to be doing well with regard to his right lower extremity I really do not see any open  wounds at this point. His left lower extremity does have several open areas and his right medial thigh also is open. Other than this however overall the patient seems to be making good progress and I am very pleased at this point. 12/01/2019 on evaluation today patient appears to be doing poorly at this point in regard to his left lower extremity has several new blisters despite the fact that we have him in compression wraps. In fact he had a 4-layer compression wrap, his upper thigh wrapped from lymphedema clinic, and a juxta light over top of the 4 layer compression wrap the lymphedema clinic applied and despite all this he still develop blisters underneath. Obviously this does  have me concerned about the fact that unfortunately despite what we are doing to try to get wounds healed he continues to have new areas arise I do not think he is ever good to be at the point where he can realistically just use wraps at home to keep things under control. Typically when we heal him it takes about 1-2 days before he is back in the clinic with severe breakdown and blistering of his lower extremities bilaterally. This is happened numerous times in the past. Unfortunately I think that we may need some help as far as overall fluid overload to kind of limit what we are seeing and get things under better control. 12/08/2019 on evaluation today patient presents for follow-up concerning his ongoing bilateral lower extremity edema. Unfortunately he is still having quite a bit of swelling the compression wraps are controlling this to some degree but he did see [name] his cardiologist I do have that available for review today as far as the appointment was concerned that was on 12/06/2019. Obviously that she has been 2 days ago. The patient states that he is only been taking the Lasix 80 mg 1 time a day he had told me previously he was taking this twice a day. Nonetheless [name] recommended this be up to 80 mg 2 times a  day for the patient as he did appear to be fluid overloaded. With that being said the patient states he did this yesterday and he was unable to go anywhere or do anything due to the fact that he was constantly having to urinate. Nonetheless I think that this is still good to be something that is important for him as far as trying to get his edema under control at all things that he is going to be able to just expect his wounds to get under control and things to be better without going through at least a period of time where he is trying to stabilize his fluid management in general and I think increasing the Lasix is likely the first step here. It was also mentioned the possibility that the patient may require metolazone. With that being said he wanted to have the patient take Lasix twice a day first and then reevaluating 2 months to see where things stand. 12/15/2019 upon evaluation today patient appears to be doing regard to his legs although his toes are showing some signs of weeping especially on the left at this point to some degree on the right. There does not appear to be any signs of active infection and overall I do feel like the compression wraps are doing well for him but he has not been able to take the Lasix at home and the increased dose that [name] recommended. He tells me that just not go to be feasible for him. Nonetheless I think in this case he should probably send a message to [name] in order to discuss options from the standpoint of possible admission to get the fluid off or otherwise going forward. 12/22/2019 upon evaluation today patient appears to be doing fairly well with regard to his lower extremities at this point. In fact he would be doing excellent if it was not for the fact that his right anterior thigh apparently had an allergic reaction to adhesive tape that he used. The wound itself that we have been monitoring actually appears to be healed. There is a lot of  irritation at this point. 12/29/2019 upon evaluation today patient appears to  be doing well in regard to his lower extremities. His left medial thigh is open and somewhat draining today but this is the only region that is open the right has done much better with the treatment utilizing the steroid cream that I prescribed for him last week. Overall I am pleased in that regard. Fortunately there is no signs of active infection at this time. No fevers, chills, nausea, vomiting, or diarrhea. 01/05/2020 upon evaluation today patient appears to be doing more poorly in regard to his right lower extremity at this point upon evaluation today. Unfortunately he continues to have issues in this regard and I think the biggest issue is controlling his edema. This obviously is not very well controlled at this point is been recommended that he use the Lasix twice a day but he has not been able to do that. Unfortunately I think this is leading to an issue where honestly he is not really able to effectively control his edema and therefore the wounds really are not doing significantly better. I do not think that he is going to be able to keep things under good control unless he is able to control his edema much better. I discussed this again in great detail with him today. 01/12/2020 good news is patient actually appears to be doing quite well today at this point. He does have an appointment with lymphedema clinic tomorrow. His legs appear healed and the toe on the left is almost completely healed. In general I am very pleased with how things stand at this point. 01/19/2020 upon evaluation today patient appears to actually be doing well in regard to his lower extremities there is nothing open at this point. Fortunately he has done extremely well more recently. Has been seeing lymphedema clinic as well. With that being said he has Velcro wraps for his lower legs as well as his upper legs. The only wound really is on his toe  which is the right great toe and this is barely anything even there. With all that being said I think it is good to be appropriate today to go ahead and switch him over to the Velcro compression wraps. 01/26/2020 upon evaluation today patient appears to be doing worse with regard to his lower extremities after last week switch him to Velcro compression wraps. Unfortunately he lasted less than 24 hours he did not have the sock portion of his Velcro wrap on the left leg and subsequently developed a blister underneath the Velcro portion. Obviously this is not good and not what we were looking for at this point. He states the lymphedema clinic did tell him to wear the wrap for 23 hours and take him off for 1 I am okay with that plan but again right now we got a get things back under control again he may have some cellulitis noted as well. 02/02/2020 upon evaluation today patient unfortunately appears to have several areas of blistering on his bilateral lower extremities today mainly on the feet. His legs do seem to be doing somewhat better which is good news. Fortunately there is no evidence of active infection at this time. No fevers, chills, nausea, vomiting, or diarrhea. 02/16/2020 upon evaluation today patient appears to be doing well at this time with regard to his legs. He has a couple weeping areas on his toes but for the most part everything is doing better and does appear to be sealed up on his legs which is excellent news. We can continue with wrapping him  at this point as he had every time we discontinue the wraps he just breaks out with new wounds. There is really no point in is going forward with this at this point. 03/08/2020 upon evaluation today patient actually appears to be doing quite well with regard to his lower extremity ulcers. He has just a very superficial and really almost nonexistent blister on the left lower extremity he has in general done very well with the compression wraps. With  that being said I do not see any signs of infection at this time which is good news. 03/29/2020 upon evaluation today patient appears to be doing well with regard to his wounds currently except for where he had several new areas that opened up due to some of the wrap slipping and causing him trouble. He states he did not realize they had slipped. Nonetheless he has a 1 area on the right and 3 new areas on the left. Fortunately there is no signs of active infection at this time which is great news. 04/05/2020 upon evaluation today patient actually appears to be doing quite well in general in regard to his legs currently. Fortunately there is no signs of active infection at this time. No fevers, chills, nausea, vomiting, or diarrhea. He tells me next week that he will actually be seen in the lymphedema clinic on Thursday at 10 AM I see him on Wednesday next week. Electronic Signature(s) Signed: 04/05/2020 11:19:38 AM By: Lenda Kelp PA-C Entered By: Lenda Kelp on 04/05/2020 11:19:37 -------------------------------------------------------------------------------- Physical Exam Details Patient Name: Date of Service: [name], [name] 04/05/2020 10:15 A M Medical Record Number: [id number] Patient Account Number: [id number] Date of Birth/Sex: Treating RN: 06-Mar-1951 (69 y.o. [name] Primary Care Provider: Marney Setting, [name] Other Clinician: Referring Provider: Treating Provider/Extender: Lenda Kelp [name], [name] [name], [name] in Treatment: 219 Constitutional Obese and well-hydrated in no acute distress. Respiratory normal breathing without difficulty. Psychiatric this patient is able to make decisions and demonstrates good insight into disease process. Alert and Oriented x 3. pleasant and cooperative. Notes Upon inspection patient's wound bed actually showed signs of fairly good epithelization and everything appears to be quite dry compared to previous. I feel like he is getting very  close to complete resolution. If we can get him completely resolved by next week that would be optimal is then he would be able to see [name], put him in his Velcro wraps, and then have him follow-up with the lymphedema clinic the next day to make sure he things seems to be doing well. Electronic Signature(s) Signed: 04/05/2020 11:20:11 AM By: Lenda Kelp PA-C Entered By: Lenda Kelp on 04/05/2020 11:20:11 -------------------------------------------------------------------------------- Physician Orders Details Patient Name: Date of Service: [name], [name] 04/05/2020 10:15 A M Medical Record Number: [id number] Patient Account Number: [id number] Date of Birth/Sex: Treating RN: Dec 01, 1950 (69 y.o. 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 Dressing Change Frequency Do not change entire dressing for one week. - both legs Skin Barriers/Peri-Wound Care ntifungal cream - to both feet A Moisturizing lotion - mixed with TCA cream to legs Wound Cleansing May shower with protection. Primary Wound Dressing Wound #169 Left,Dorsal Foot Calcium Alginate with Silver Wound #170 Left,Distal,Dorsal Foot Calcium Alginate with Silver Secondary Dressing Wound #169 Left,Dorsal  Foot Dry Gauze Wound #170 Left,Distal,Dorsal Foot Dry Gauze Edema Control 4 layer compression: Left lower extremity Unna Boot to Right Lower Extremity Avoid standing for long periods of time Elevate legs to the level of the heart or above for 30 minutes daily and/or when sitting, a frequency of: - whenever sitting Exercise regularly Segmental Compressive Device. - lymphadema pumps 60 minutes 2 times per day Other: - If feet or wraps become wet, remove wrap and apply juxtalites Additional Orders / Instructions Other: - take lasix as prescribed by cardiologist Electronic Signature(s) Signed: 04/05/2020 4:50:50 PM By: Lenda Kelp PA-C Signed: 04/05/2020 5:52:33 PM By: Zenaida Deed RN, BSN Entered By: Zenaida Deed on 04/05/2020 11:17:31 -------------------------------------------------------------------------------- Problem List Details Patient Name: Date of Service: [name], Rushawn J. 04/05/2020 10:15 A M Medical Record Number: [id number] Patient Account Number: [id number] Date of Birth/Sex: Treating RN: 11/22/1950 (69 y.o. [name] Primary Care Provider: Marney Setting, [name] Other Clinician: Referring Provider: Treating Provider/Extender: Suzy Bouchard, [name] in Treatment: 219 Active Problems ICD-10 Encounter Code Description Active Date MDM Diagnosis 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 skin 06/30/2018 No Yes L97.221 Non-pressure chronic ulcer of left calf limited to breakdown of skin 09/30/2016 No Yes 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 of 01/22/2016 No Yes bilateral lower extremity I89.0 Lymphedema, not elsewhere classified 01/22/2016 No Yes E11.622 Type 2 diabetes mellitus with other skin ulcer 01/22/2016 No Yes 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, unspecified 01/22/2016 No Yes L03.116 Cellulitis of left lower limb 04/01/2017 No Yes Inactive  Problems ICD-10 Code Description Active Date Inactive Date 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 skin 06/30/2017 06/30/2017 L97.521 Non-pressure chronic ulcer of other 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 of skin 04/27/2018 04/27/2018 L03.115 Cellulitis of right lower limb 12/22/2017 12/22/2017 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 06/30/2018 06/30/2018 L97.511 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 of skin 06/30/2018 06/30/2018 Resolved Problems Electronic Signature(s) Signed: 04/05/2020 10:45:28 AM By: Lenda Kelp PA-C Entered By: Lenda Kelp on 04/05/2020 10:45:26 -------------------------------------------------------------------------------- Progress Note Details Patient Name: Date of Service: [name], Horris J. 04/05/2020 10:15 A M Medical Record Number: [id number] Patient Account Number: [id number] Date of Birth/Sex: Treating RN: [birth year] (69 y.o. [name] Primary Care Provider: Marney Setting, [name] Other Clinician: Referring Provider: Treating Provider/Extender: Lenda Kelp [name] [name], [name] in Treatment: 219 Subjective Chief Complaint Information obtained from Patient patient is here for evaluation venous/lymphedema weeping History of Present Illness (HPI) Referred by PCP for consultation. Patient has long standing history of BLE venous stasis, no prior ulcerations. At beginning of month, developed cellulitis and weeping. Received IM Rocephin followed by Keflex and resolved. Wears compression stocking, appr 6 months old. Not sure strength. No present drainage. 01/22/16 this is a patient who is a type II diabetic on insulin. 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 He tells me he religiously wears pressure stockings of uncertain strength. He was here  with weeping edema about 8 months ago but did not have an open wound.  Roughly a month ago he had a reopening on his bilateral legs. He is been using bandages and Neosporin. He does not complain of pain. 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 Last BUN/creatinine I have is from 11/20/15 at 13 and 1.0 respectively 01/29/16; patient arrives today having tolerated the Profore wrap. He brought in his stockings and these are 18 mmHg stockings he bought from [place]. The compression here is likely inadequate. He does not complain of pain or excessive drainage she has no systemic symptoms. The wound on the right looks improved as does the one on the left although one on the left is more substantial with still tissue at risk below the actual wound area on the bilateral posterior calf 02/05/16; patient arrives with poor edema control. He states that we did put a 4 layer compression on it last week. No weight appear 5 this. 02/12/16; the area on the posterior right Has healed. The left Has a substantial wound that has necrotic surface eschar that requires a debridement with a curette. 02/16/16;the patient called or a Nurse visit secondary to increased swelling. He had been in earlier in the week with his right leg healed. He was transitioned to is on pressure stocking on the right leg with the only open wound on the left, a substantial area on the left posterior calf. Note he has a history of severe lower extremity edema, he has a history of chronic atrial fibrillation but not heart failure per my notes but I'll need to research this. He is not complaining of chest pain shortness of breath or orthopnea. The intake nurse noted blisters on the previously closed right leg 02/19/16; this is the patient's regular visit day. I see him on Friday with escalating edema new wounds on the right leg and clear signs of at least right ventricular heart failure. I increased his Lasix to 40  twice a day. He is returning currently in follow-up. States he is noticed a decrease in that the edema 02/26/16 patient's legs have much less edema. There is nothing really open on the right leg. The left leg has improved condition of the large superficial wound on the posterior left leg 03/04/16; edema control is very much better. The patient's right leg wounds have healed. On the left leg he continues to have severe venous inflammation on the posterior aspect of the left leg. There is no tenderness and I don't think any of this is cellulitis. 03/11/16; patient's right leg is married healed and he is in his own stocking. The patient's left leg has deteriorated somewhat. There is a lot of erythema around the wound on the posterior left leg. There is also a significant rim of erythema posteriorly just above where the wrap would've ended there is a new wound in this location and a lot of tenderness. Can't rule out cellulitis in this area. 03/15/16; patient's right leg remains healed and he is in his own stocking. The patient's left leg is much better than last review. His major wound on the posterior aspect of his left Is almost fully epithelialized. He has 3 small injuries from the wraps. Really. Erythema seems a lot better on antibiotics 03/18/16; right leg remains healed and he is in his own stocking. The patient's left leg is much better. The area  on the posterior aspect of the left calf is fully epithelialized. His 3 small injuries which were wrap injuries on the left are improved only one seems still open his erythema has resolved 03/25/16; patient's right leg remains healed and he is in his own stocking. There is no open area today on the left leg posterior leg is completely closed up. His wrap injuries at the superior aspect of his leg are also resolved. He looks as though he has some irritation on the dorsal ankle but this is fully epithelialized without evidence of infection. 03/28/16; we discharged  this patient on Monday. Transitioned him into his own stocking. There were problems almost immediately with uncontrolled swelling weeping edema multiple some of which have opened. He does not feel systemically unwell in particular no chest pain no shortness of breath and he does not feel 04/08/16; the edema is under better control with the Profore light wrap but he still has pitting edema. There is one large wound anteriorly 2 on the medial aspect of his left leg and 3 small areas on the superior posterior calf. Drainage is not excessive he is tolerating a Profore light well 04/15/16; put a Profore wrap on him last week. This is controlled is edema however he had a lot of pain on his left anterior foot most of his wounds are healed 04/22/16 once again the patient has denuded areas on the left anterior foot which he states are because his wrap slips up word. He saw his primary physician today is on Lasix 40 twice a day and states that he his weight is down 20 pounds over the last 3 months. 04/29/16: Much improved. left anterior foot much improved. He is now on Lasix 80 mg per day. Much improved edema control 05/06/16; I was hoping to be able to discharge him today however once again he has blisters at a low level of where the compression was placed last week mostly on his left lateral but also his left medial leg and a small area on the anterior part of the left foot. 05/09/16; apparently the patient went home after his appointment on 7/4 later in the evening developing pain in his upper medial thigh together with subjective fever and chills although his temperature was not taken. The pain was so intense he felt he would probably have to call 911. However he then remembered that he had leftover doxycycline from a previous round of antibiotics and took these. By the next morning he felt a lot better. He called and spoke to one of our nurses and I approved doxycycline over the phone thinking that this was in  relation to the wounds we had previously seen although they were definitely were not. The patient feels a lot better old fever no chills he is still working. Blood sugars are reasonably controlled 05/13/16; patient is back in for review of his cellulitis on his anterior medial upper thigh. He is taking doxycycline this is a lot better. Culture I did of the nodular area on the dorsal aspect of his foot grew MRSA this also looks a lot better. 05/20/16; the patient is cellulitis on the medial upper thigh has resolved. All of his wound areas including the left anterior foot, areas on the medial aspect of the left calf and the lateral aspect of the calf at all resolved. He has a new blister on the left dorsal foot at the level of the fourth toe this was excised. No evidence of infection 05/27/16; patient continues  to complain weeping edema. He has new blisterlike wounds on the left anterior lateral and posterior lateral calf at the top of his wrap levels. The area on his left anterior foot appears better. He is not complaining of fever, pain or pruritus in his feet. 05/30/16; the patient's blisters on his left anterior leg posterior calf all look improved. He did not increase the Lasix 100 mg as I suggested because he was going to run out of his 40 mg tablets. He is still having weeping edema of his toes 06/03/16; I renewed his Lasix at 80 mg once a day as he was about to run out when I last saw him. He is on 80 mg of Lasix now. I have asked him to cut down on the excessive amount of water he was drinking and asked him to drink according to his thirst mechanisms 06/12/2016 -- was seen 2 days ago and was supposed to wear his compression stockings at home but he is developed lymphedema and superficial blisters on the left lower extremity and hence came in for a review 06/24/16; the remaining wound is on his left anterior leg. He still has edema coming from between his toes. There is lymphedema here however his  edema is generally better than when I last saw this. He has a history of atrial fibrillation but does not have a known history of congestive heart failure nevertheless I think he probably has this at least on a diastolic basis. 07/01/16 I reviewed his echocardiogram from January 2017. This was essentially normal. He did not have LVH, EF of 55-60%. His right ventricular function was normal although he did have trivial tricuspid and pulmonic regurgitation. This is not audible on exam however. I increased his Lasix to do massive edema in his legs well above his knees I think in early July. He was also drinking an excessive amount of water at the time. 07/15/16; missed his appointment last week because of the Labor Day holiday on Monday. He could not get another appointment later in the week. Started to feel the wrap digging in superiorly so we remove the top half and the bottom half of his wrap. He has extensive erythema and blistering superiorly in the left leg. Very tender. Very swollen. Edema in his foot with leaking edema fluid. He has not been systemically unwell 07/22/16; the area on the left leg laterally required some debridement. The medial wounds look more stable. His wrap injury wounds appear to have healed. Edema and his foot is better, weeping edema is also better. He tells me he is meeting with the supplier of the external compression pumps at work 08/05/16; the patient was on vacation last week in [name]. His wrap is been on for an extended period of time. Also over the weekend he developed an extensive area of tender erythema across his anterior medial thigh. He took to doxycycline yesterday that he had leftover from a previous prescription. The patient complains of weeping edema coming out of his toes 08/08/16; I saw this patient on 10/2. He was tender across his anterior thigh. I put him on doxycycline. He returns today in follow-up. He does not have any open wounds on his lower leg,  he still has edema weeping into his toes. 08/12/16; patient was seen back urgently today to follow-up for his extensive left thigh cellulitis/erysipelas. He comes back with a lot less swelling and erythema pain is much better. I believe I gave him Augmentin and Cipro. His wrap was  cut down as he stated a roll down his legs. He developed blistering above the level of the wrap that remained. He has 2 open blisters and 1 intact. 08/19/16; patient is been doing his primary doctor who is increased his Lasix from 40-80 once a day or 80 already has less edema. Cellulitis has remained improved in the left thigh. 2 open areas on the posterior left calf 08/26/16; he returns today having new open blisters on the anterior part of his left leg. He has his compression pumps but is not yet been shown how to use some vital representative from the supplier. 09/02/16 patient returns today with no open wounds on the left leg. Some maceration in his plantar toes 09/10/2016 -- [name] had recently discharged him on 09/02/2016 and he has come right back with redness swelling and some open ulcers on his left lower extremity. He says this was caused by trying to apply his compression stockings and he's been unable to use this and has not been able to use his lymphedema pumps. He had some doxycycline leftover and he has started on this a few days ago. 09/16/16; there are no open wounds on his leg on the left and no evidence of cellulitis. 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 He does not complain of symptoms here. I am not clear use using his external compression pumps. 09/23/16; I have not seen this patient in 2 weeks. He canceled his appointment 10 days ago as he was going on vacation. He tells me that on Monday he noticed a large area on his posterior left leg which is been draining copiously and is reopened into a large wound. He is been using ABDs and the  external part of his juxtalite, according to our nurse this was not on properly. 10/07/16; Still a substantial area on the posterior left leg. Using silver alginate 10/14/16; in general better although there is still open area which looks healthy. Still using silver alginate. He reminds me that this happen before he left for [name] oday while he was showering in the morning. He had been using his juxtalite's 10/21/16; the area on his posterior left leg is fully epithelialized. However he arrives today with a large area of tender erythema in his medial and posterior left thigh just above the knee. I have marked the area. Once again he is reluctant to consider hospitalization. I treated him with oral antibiotics in the past for a similar situation with resolution I think with doxycycline however this area it seems more extensive to me. He is not complaining of fever but does have chills and says states he is thirsty. His blood sugar today was in the 140s at home 10/25/16 the area on his posterior left leg is fully epithelialized although there is still some weeping edema. The large area of tenderness and erythema in his medial and posterior left thigh is a lot less tender although there is still a lot of swelling in this thigh. He states he feels a lot better. He is on doxycycline and Augmentin that I started last week. This will continued until Tuesday, December 26. I have ordered a duplex ultrasound of the left thigh rule out DVT whether there is an abscess something that would need to be drained I would also like to know. 11/01/16; he still has weeping edema from a not fully epithelialized area on his left posterior calf. Most of the rest  of this looks a lot better. He has completed his antibiotics. His thigh is a lot better. Duplex ultrasound did not show a DVT in the thigh 11/08/16; he comes in today with more Denuded surface epithelium from the posterior aspect of his calf. There is no real  evidence of cellulitis. The superior aspect of his wrap appears to have put quite an indentation in his leg just below the knee and this may have contributed. He does not complain of pain or fever. We have been using silver alginate as the primary dressing. The area of cellulitis in the right thigh has totally resolved. He has been using his compression stockings once a week 11/15/16; the patient arrives today with more loss of epithelium from the posterior aspect of his left calf. He now has a fairly substantial wound in this area. The reason behind this deterioration isn't exactly clear although his edema is not well controlled. He states he feels he is generally more swollen systemically. He is not complaining of chest pain shortness of breath fever. T me he has an appointment with his primary physician in early February. He is on 80 mg of oral ells Lasix a day. He claims compliance with the external compression pumps. He is not having any pain in his legs similar to what he has with his recurrent cellulitis 11/22/16; the patient arrives a follow-up of his large area on his left lateral calf. This looks somewhat better today. He came in earlier in the week for a dressing change since I saw him a week ago. He is not complaining of any pain no shortness of breath no chest pain 11/28/16; the patient arrives for follow-up of his large area on the left lateral calf this does not look better. In fact it is larger weeping edema. The surface of the wound does not look too bad. We have been using silver alginate although I'm not certain that this is a dressing issue. 12/05/16; again the patient follows up for a large wound on the left lateral and left posterior calf this does not look better. There continues to be weeping edema necrotic surface tissue. More worrisome than this once again there is erythema below the wound involving the distal Achilles and heel suggestive of cellulitis. He is on his feet working  most of the day of this is not going well. We are changing his dressing twice a week to facilitate the drainage. 12/12/16; not much change in the overall dimensions of the large area on the left posterior calf. This is very inflamed looking. I gave him an. Doxycycline last week does not really seem to have helped. He found the wrap very painful indeed it seems to of dog into his legs superiorly and perhaps around the heel. He came in early today because the drainage had soaked through his dressings. 12/19/16- patient arrives for follow-up evaluation of his left lower extremity ulcers. He states that he is using his lymphedema pumps once daily when there is "no drainage". He admits to not using his lipedema pumps while under current treatment. His blood sugars have been consistently between 150-200. 12/26/16; the patient is not using his compression pumps at home because of the wetness on his feet. I've advised him that I think it's important for him to use this daily. He finds his feet too wet, he can put a plastic bag over his legs while he is in the pumps. Otherwise I think will be in a vicious circle. We are using  silver alginate to the major area on his left posterior calf 01/02/17; the patient's posterior left leg has further of all into 3 open wounds. All of them covered with a necrotic surface. He claims to be using his compression pumps once a day. His edema control is marginal. Continue with silver alginate 01/10/17; the patient's left posterior leg actually looks somewhat better. There is less edema, less erythema. Still has 3 open areas covered with a necrotic surface requiring debridement. He claims to be using his compression pumps once a day his edema control is better 01/17/17; the patient's left posterior calf look better last week when I saw him and his wrap was changed 2 days ago. He has noted increasing pain in the left heel and arrives today with much larger wounds extensive erythema  extending down into the entire heel area especially tender medially. He is not systemically unwell CBGs have been controlled no fever. Our intake nurse showed me limegreen drainage on his AVD pads. 01/24/17; his usual this patient responds nicely to antibiotics last week giving him Levaquin for presumed Pseudomonas. The whole entire posterior part of his leg is much better much less inflamed and in the case of his Achilles heel area much less tender. He has also had some epithelialization posteriorly there are still open areas here and still draining but overall considerably better 01/31/17- He has continue to tolerate the compression wraps. he states that he continues to use the lymphedema pumps daily, and can increase to twice daily on the weekends. He is voicing no complaints or concerns regarding his LLE ulcers 02/07/17-he is here for follow-up evaluation. He states that he noted some erythema to the left medial and anterior thigh, which he states is new as of yesterday. He is concerned about recurrent cellulitis. He states his blood sugars have been slightly elevated, this morning in the 180s 02/14/17; he is here for follow-up evaluation. When he was last here there was erythema superiorly from his posterior wound in his anterior thigh. He was prescribed Levaquin however a culture of the wound surface grew MRSA over the phone I changed him to doxycycline on Monday and things seem to be a lot better. 02/24/17; patient missed his appointment on Friday therefore we changed his nurse visit into a physician visit today. Still using silver alginate on the large area of the posterior left thigh. He isn't new area on the dorsal left second toe 03/03/17; actually better today although he admits he has not used his external compression pumps in the last 2 days or so because of work responsibilities over the weekend. 03/10/17; continued improvement. External compression pumps once a day almost all of his wounds  have closed on the posterior left calf. Better edema control 03/17/17; in general improved. He still has 3 small open areas on the lateral aspect of his left leg however most of the area on the posterior part of his leg is epithelialized. He has better edema control. He has an ABD pad under his stocking on the right anterior lower leg although he did not let [name] look at that today. 03/24/17; patient arrives back in clinic today with no open areas however there are areas on the posterior left calf and anterior left calf that are less than 100% epithelialized. His edema is well controlled in the left lower leg. There is some pitting edema probably lymphedema in the left upper thigh. He uses compression pumps at home once per day. I tried to get him to  do this twice a day although he is very reticent. 04/01/2017 -- for the last 2 days he's had significant redness, tenderness and weeping and came in for an urgent visit today. 04/07/17; patient still has 6 more days of doxycycline. He was seen by [name] last Wednesday for cellulitis involving the posterior aspect, lateral aspect of his Involving his heel. For the most part he is better there is less erythema and less weeping. He has been on his feet for 12 hours o2 over the weekend. Using his compression pumps once a day 04/14/17 arrives today with continued improvement. Only one area on the posterior left calf that is not fully epithelialized. He has intense bilateral venous inflammation associated with his chronic venous insufficiency disease and secondary lymphedema. We have been using silver alginate to the left posterior calf wound In passing he tells [name] today that the right leg but we have not seen in quite some time has an open area on it but he doesn't want [name] to look at this today states he will show this to [name] next week. 04/21/17; there is no open area on his left leg although he still reports some weeping edema. He showed [name] his right leg today  which is the first time we've seen this leg in a long time. He has a large area of open wound on the right leg anteriorly healthy granulation. Quite a bit of swelling in the right leg and some degree of venous inflammation. He told [name] about the right leg in passing last week but states that deterioration in the right leg really only happened over the weekend 04/28/17; there is no open area on the left leg although there is an irritated part on the posterior which is like a wrap injury. The wound on the right leg which was new from last week at least to [name] is a lot better. 05/05/17; still no open area on the left leg. Patient is using his new compression stocking which seems to be doing a good job of controlling the edema. He states he is using his compression pumps once per day. The right leg still has an open wound although it is better in terms of surface area. Required debridement. A lot of pain in the posterior right Achilles marked tenderness. Usually this type of presentation this patient gives concern for an active cellulitis 05/12/17; patient arrives today with his major wound from last week on the right lateral leg somewhat better. Still requiring debridement. He was using his compression stocking on the left leg however that is reopened with superficial wounds anteriorly he did not have an open wound on this leg previously. He is still using his juxta light's once daily at night. He cannot find the time to do this in the morning as he has to be at work by 7 AM 05/19/17; right lateral leg wound looks improved. No debridement required. The concerning area is on the left posterior leg which appears to almost have a subcutaneous hemorrhagic component to it. We've been using silver alginate to all the wounds 05/26/17; the right lateral leg wound continues to look improved. However the area on the left posterior calf is a tightly adherent surface. [place] using silver alginate. Because of the weeping  edema in his legs there is very little good alternatives. 06/02/17; the patient left here last week looking quite good. Major wound on the left posterior calf and a small one on the right lateral calf. Both of these look  satisfactory. He tells me that by Wednesday he had noted increased pain in the left leg and drainage. He called on Thursday and Friday to get an appointment here but we were blocked. He did not go to urgent care or his primary physician. He thinks he had a fever on Thursday but did not actually take his temperature. He has not been using his compression pumps on the left leg because of pain. I advised him to go to the emergency room today for IV antibiotics for stents of left leg cellulitis but he has refused I have asked him to take 2 days off work to keep his leg elevated and he has refused this as well. In view of this I'm going to call him and Augmentin and doxycycline. 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 However after a detailed discussion regarding the risk benefits and alternatives he refuses to get admitted to the [name]. With no other recourse I will continue him on oral antibiotics as before and hopefully he'll have his infectious disease consultation this week. 06/16/2017 -- the patient was seen today by the nurse practitioner at infectious disease [name]. 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 He has also been advise Lotrimin cream locally. she also had wise IV antibiotics for his condition if it flares up 06/23/17; patient arrives today with  drainage bilaterally although the remaining wound on the left posterior calf after cleaning up today "highlighter yellow drainage" did not look too bad. Unfortunately he has had breakdown on the right anterior leg [previously this leg had not been open and he is using a black stocking] he went to see infectious disease and is been put on clindamycin 150 mg daily, I did not verify the dose although I'm not familiar with using clindamycin in this dosing range, perhaps for prophylaxisoo 06/27/17; I brought this patient back today to follow-up on the wound deterioration on the right lower leg together with surrounding cellulitis. I started him on doxycycline 4 days ago. This area looks better however he comes in today with intense cellulitis on the medial part of his left thigh. This is not have a wound in this area. Extremely tender. We've been using silver alginate to the wounds on the right lower leg left lower leg with bilateral 4 layer compression he is using his external compression pumps once a day 07/04/17; patient's left medial thigh cellulitis looks better. He has not been using his compression pumps as his insert said it was contraindicated with cellulitis. His right leg continues to make improvements all the wounds are still open. We only have one remaining wound on the left posterior calf. Using silver alginate to all open areas. He is on doxycycline which I started a week ago and should be finishing I gave him Augmentin after Thursday's visit for the severe cellulitis on the left medial thigh which fortunately looks better 07/14/17; the patient's left medial thigh cellulitis has resolved. The cellulitis in his right lower calf on the right also looks better. All of his wounds are stable to  improved we've been using silver alginate he has completed the antibiotics I have given him. He has clindamycin 150 mg once a day prescribed by infectious disease for prophylaxis, I've advised him to start  this now. We have been using bilateral Unna boots over silver alginate to the wound areas 07/21/17; the patient is been to see infectious disease who noted his recurrent problems with cellulitis. He was not able to tolerate prophylactic clindamycin therefore he is on amoxicillin 500 twice a day. He also had a second daily dose of Lasix added By [name] but he is not taking this. Nor is he being completely compliant with his compression pumps a especially not this week. He has 2 remaining wounds one on the right posterior lateral lower leg and one on the left posterior medial lower leg. 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 The patient has Unna boots bilaterally. 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 He has bilateral Unna boots for compression and silver alginate to his wounds. Arrives today with his legs looking as good as I have seen him in quite some time. Not surprisingly his wounds look better as well with improvement on the right lateral leg venous insufficiency wound and also the left medial leg. He is still using the compression pumps once a day 08/11/17; both legs appear to be doing better wounds on the right lateral and left medial legs look better. Skin on the right leg quite good. He is been using silver alginate as the primary dressing. I'm going to use Anasept gel calcium alginate and maintain all the secondary dressings 08/18/17; the patient continues to actually do quite well. The area on his right lateral leg is just about closed the left medial also looks better although it is still moist in this area. His edema is well controlled we have been using Anasept gel with calcium alginate and the usual secondary dressings, 4 layer compression and once daily  use of his compression pumps "always been able to manage 09/01/17; the patient continues to do reasonably well in spite of his trip to [place]. The area on the right lateral leg is epithelialized. Left is much better but still open. He has more edema and more chronic erythema on the left leg [venous inflammation] 09/08/17; he arrives today with no open wound on the right lateral leg and decently controlled edema. Unfortunately his left leg is not nearly as in his good situation as last week.he apparently had increasing edema starting on Saturday. He edema soaked through into his foot so used a plastic bag to walk around his home. The area on the medial right leg which was his open area is about the same however he has lost surface epithelium on the left lateral which is new and he has significant pain in the Achilles area of the left foot. He is already on amoxicillin chronically for prophylaxis of cellulitis in the left leg 09/15/17; he is completed a week of doxycycline and the cellulitis in the left posterior leg and Achilles area is as usual improved. He still has a lot of edema and fluid soaking through his dressings. There is no open wound on the right leg. He saw infectious disease NP today 09/22/17;As usual 1 we transition him from our compression wraps  to his stockings things did not go well. He has several small open areas on the right leg. He states this was caused by the compression wrap on his skin although he did not wear this with the stockings over them. He has several superficial areas on the left leg medially laterally posteriorly. He does not have any evidence of active cellulitis especially involving the left Achilles The patient is traveling from [name] [place] Saturday going to [name]. He states he isn't attempting to get an appointment with a heel objects wound [name] there to change his dressings. I am not completely certain whether this will work 10/06/17; the patient came  in on Friday for a nurse visit and the nurse reported that his legs actually look quite good. He arrives in clinic today for his regular follow-up visit. He has a new wound on his left third toe over the PIP probably caused by friction with his footwear. He has small areas on the left leg and a very superficial but epithelialized area on the right anterior lateral lower leg. Other than that his legs look as good as I've seen him in quite some time. We have been using silver alginate Review of systems; no chest pain no shortness of breath other than this a 10 point review of systems negative 10/20/17; seen by [name] last week. He had taken some antibiotics [doxycycline] that he had left over. [name] thought he had candida infection and declined to give him further antibiotics. He has a small wound remaining on the right lateral leg several areas on the left leg including a larger area on the left posterior several left medial and anterior and a small wound on the left lateral. The area on the left dorsal third toe looks a lot better. ROS; Gen.; no fever, respiratory no cough no sputum Cardiac no chest pain other than this 10 point review of system is negative 10/30/17; patient arrives today having fallen in the bathtub 3 days ago. It took him a while to get up. He has pain and maceration in the wounds on his left leg which have deteriorated. He has not been using his pumps he also has some maceration on the right lateral leg. 11/03/17; patient continues to have weeping edema especially in the left leg. This saturates his dressings which were just put on on 12/27. As usual the doxycycline seems to take care of the cellulitis on his lower leg. He is not complaining of fever, chills, or other systemic symptoms. He states his leg feels a lot better on the doxycycline I gave him empirically. He also apparently gets injections at his primary doctor's officeo Rocephin for cellulitis prophylaxis.  I didn't ask him about his compression pump compliance today I think that's probably marginal. Arrives in the clinic with all of his dressings primary and secondary macerated full of fluid and he has bilateral edema 11/10/17; the patient's right leg looks some better although there is still a cluster of wounds on the right lateral. The left leg is inflamed with almost circumferential skin loss medially to laterally although we are still maintaining anteriorly. He does not have overt cellulitis there is a lot of drainage. He is not using compression pumps. We have been using silver alginate to the wound areas, there are not a lot of options here 11/17/17; the patient's right leg continues to be stable although there is still open wounds, better than last week. The inflammation in the left leg is better. Still loss  of surface layer epithelium especially posteriorly. There is no overt cellulitis in the amount of edema and his left leg is really quite good, tells me he is using his compression pumps once a day. 11/24/17; patient's right leg has a small superficial wound laterally this continues to improve. The inflammation in the left leg is still improving however we have continuous surface layer epithelial loss posteriorly. There is no overt cellulitis in the amount of edema in both legs is really quite good. He states he is using his compression pumps on the left leg once a day for 5 out of 7 days 12/01/17; very small superficial areas on the right lateral leg continue to improve. Edema control in both legs is better today. He has continued loss of surface epithelialization and left posterior calf although I think this is better. We have been using silver alginate with large number of absorptive secondary dressings 4 layer on the left Unna boot on the right at his request. He tells me he is using his compression pumps once a day 12/08/17; he has no open area on the right leg is edema control is good  here. ooOn the left leg however he has marked erythema and tenderness breakdown of skin. He has what appears to be a wrap injury just distal to the popliteal fossa. 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 There was swelling erythema and breakdown in the right leg. He has much worse skin breakdown in the left leg as well multiple open areas medially and posteriorly as well as laterally. He tells me he has been using his compression pumps but tells me he feels that the drainage out of his leg is worse when he uses a compression pumps. T be fair to him he is been saying this o for a while however I don't know that I have really been listening to this. I wonder if the compression pumps are working properly 12/22/17;. Once again he arrives with severe erythema, weeping edema from the left greater than right leg. Noncompliance with compression pumps. New this visit he is complaining of pain on the lateral aspect of the right leg and the medial aspect of his right thigh. He apparently saw his cardiologist [name] who was ordered an echocardiogram area and I think this is a step in the right direction 12/25/17; started his doxycycline Monday night. There is still intense erythema of the right leg especially in the anterior thigh although there is less tenderness. The erythema around the wound on the right lateral calf also is less tender. He still complaining of pain in the left heel. His wounds are about the same right lateral left medial left lateral. Superficial but certainly not close to closure. He denies being systemically unwell no fever chills no abdominal pain no diarrhea 12/29/17; back in follow-up of his extensive right  calf and right thigh cellulitis. I added amoxicillin to cover possible doxycycline resistant strep. This seems to of done the trick he is in much less pain there is much less erythema and swelling. He has his echocardiogram at 11:00 this morning. X-ray of the left heel was also negative. 01/05/18; the patient arrived with his edema under much better control. Now that he  is retired he is able to use his compression pumps daily and sometimes twice a day per the patient. He has a wound on the right leg the lateral wound looks better. Area on the left leg also looks a lot better. He has no evidence of cellulitis in his bilateral thighs I had a quick peak at his echocardiogram. He is in normal ejection fraction and normal left ventricular function. He has moderate pulmonary hypertension moderately reduced right ventricular function. One would have to wonder about chronic sleep apnea although he says he doesn't snore. He'll review the echocardiogram with his cardiologist. 01/12/18; the patient arrives with the edema in both legs under exemplary control. He is using his compression pumps daily and sometimes twice daily. His wound on the right lateral leg is just about closed. He still has some weeping areas on the posterior left calf and lateral left calf although everything is just about closed here as well. I have spoken with Aldean Baker who is the patient's nurse practitioner and infectious disease. She was concerned that the patient had not understood that the parenteral penicillin injections he was receiving for cellulitis prophylaxis was actually benefiting him. I don't think the patient actually saw that I would tend to agree we were certainly dealing with less infections although he had a serious one last month. 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 He is healed. He'll be placed in juxtalite compression wraps and increase his lymphedema pumps to twice daily.  We will follow up again next week to ensure there are no issues with the new regiment. 01/20/18-he is here for evaluation of bilateral lower extremity weeping edema. Yesterday he was placed in compression wrap to the right lower extremity and compression stocking to left lower shrubbery. He states he uses lymphedema pumps last night and again this morning and noted a blister to the left lower extremity. On exam he was noted to have drainage to the right lower extremity. He will be placed in Unna boots bilaterally and follow-up next week 01/26/18; patient was actually discharged a week ago to his own juxta light stockings only to return the next day with bilateral lower extremity weeping edema.he was placed in bilateral Unna boots. He arrives today with pain in the back of his left leg. There is no open area on the right leg however there is a linear/wrap injury on the left leg and weeping edema on the left leg posteriorly. I spoke with infectious disease about 10 days ago. They were disappointed that the patient elected to discontinue prophylactic intramuscular penicillin shots as they felt it was particularly beneficial in reducing the frequency of his cellulitis. I discussed this with the patient today. He does not share this view. He'll definitely need antibiotics today. Finally he is traveling to [place] and trauma leaving this Saturday and returning a week later and he does not travel with his pumps. He is going by car 01/30/18; patient was seen 4 days ago and brought back in today for review of cellulitis in the left leg posteriorly. I put him on amoxicillin this really hasn't helped as much as I might like. He is also worried because he is traveling to [name] by car. Finally we will be rewrapping him. There is no open area on the right leg over his left leg has multiple weeping areas as usual 02/09/18; The same wrap on for 10 days. He did not pick up the last doxycycline I prescribed for  him. He apparently took 4 days worth he already had. There is nothing open on his right leg and the edema control is really quite good. He's had damage in the left leg medially and laterally especially probably related to the prolonged use of Unna boots 02/12/18; the patient arrived in clinic today for a nurse visit/wrap change. He complained of a lot of pain in the left posterior calf. He is taking doxycycline that I previously prescribed for him. Unfortunately even though he used his stockings and apparently used to compression pumps twice a day he has weeping edema coming out of the lateral part of his right leg. This is coming from the lower anterior lateral skin area. 02/16/18; the patient has finished his doxycycline and will finish the amoxicillin 2 days. The area of cellulitis in the left calf posteriorly has resolved. He is no longer having any pain. He tells me he is using his compression pumps at least once a day sometimes twice. 02/23/18; the patient finished his doxycycline and Amoxil last week. On Friday he noticed a small erythematous circle about the size of a quarter on the left lower leg just above his ankle. This rapidly expanded and he now has erythema on the lateral and posterior part of the thigh. This is bright red. Also has an area on the dorsal foot just above his toes and a tender area just below the left popliteal fossa. He came off his prophylactic penicillin injections at his own insistence one or 2 months ago. This is obviously deteriorated since then 03/02/18; patient is on doxycycline and Amoxil. Culture I did last week of the weeping area on the back of his left calf grew group B strep. I have therefore renewed the amoxicillin 500 3 times a day for a further week. He has not been systemically unwell. Still complaining of an area of discomfort right under his left popliteal fossa. There is no open wound on the right leg. He tells me that he is using his pumps twice a day on  most days 03/09/18; patient arrives in clinic today completing his amoxicillin today. The cellulitis on his left leg is better. Furthermore he tells me that he had intramuscular penicillin shots that his primary care office today. However he also states that the wrap on his right leg fell down shortly after leaving clinic last week. He developed a large blister that was present when he came in for a nurse visit later in the week and then he developed intense discomfort around this area.He tells me he is using his compression pumps 03/16/18; the patient has completed his doxycycline. The infectious part of this/cellulitis in the left heel area left popliteal area is a lot better. He has 2 open areas on the right calf. Still areas on the left calf but this is a lot better as well. 03/24/18; the patient arrives complaining of pain in the left popliteal area again. He thinks some of this is wrap injury. He has no open area on the right leg and really no open area on the left calf either except for the popliteal area. He claims to be compliant with the compression pumps 03/31/18; I gave him doxycycline last week because of cellulitis in the left popliteal area. This is a lot better although the surface epithelium is denuded off and response to this. He arrives today with uncontrolled edema in the right calf area as well as a fingernail injury in the right lateral calf. There is only a  few open areas on the left 04/06/18; I gave him amoxicillin doxycycline over the last 2 weeks that the amoxicillin should be completing currently. He is not complaining of any pain or systemic symptoms. The only open areas see has is on the right lateral lower leg paradoxically I cannot see anything on the left lower leg. He tells me he is using his compression pumps twice a day on most days. Silver alginate to the wounds that are open under 4 layer compression 04/13/18; he completed antibiotics and has no new complaints. Using his  compression pumps. Silver alginate that anything that's opened 04/20/18; he is using his compression pumps religiously. Silver alginate 4 layer compression anything that's opened. He comes in today with no open wounds on the left leg but 3 on the right including a new one posteriorly. He has 2 on the right lateral and one on the right posterior. He likes Unna boots on the right leg for reasons that aren't really clear we had the usual 4 layer compression on the left. It may be necessary to move to the 4 layer compression on the right however for now I left them in the Unna boots 04/27/18; he is using his compression pumps at least once a day. He has still the wounds on the right lateral calf. The area right posteriorly has closed. He does not have an open wound on the left under 4 layer compression however on the dorsal left foot just proximal to the toes and the left third toe 2 small open areas were identified 05/11/18; he has not uses compression pumps. The areas on the right lateral calf have coalesced into one large wound necrotic surface. On the left side he has one small wound anteriorly however the edema is now weeping out of a large part of his left leg. He says he wasn't using his pumps because of the weeping fluid. I explained to him that this is the time he needs to pump more 05/18/18; patient states he is using his compression pumps twice a day. The area on the right lateral large wound albeit superficial. On the left side he has innumerable number of small new wounds on the left calf particularly laterally but several anteriorly and medially. All these appear to have healthy granulated base these look like the remnants of blisters however they occurred under compression. The patient arrives in clinic today with his legs somewhat better. There is certainly less edema, less multiple open areas on the left calf and the right anterior leg looks somewhat better as well superficial and a little  smaller. However he relates pain and erythema over the last 3-4 days in the thigh and I looked at this today. He has not been systemically unwell no fever no chills no change in blood sugar values 05/25/18; comes in today in a better state. The severe cellulitis on his left leg seems better with the Keflex. Not as tender. He has not been systemically unwell ooHard to find an open wound on the left lower leg using his compression pumps twice a day ooThe confluent wounds on his right lateral calf somewhat better looking. These will ultimately need debridement I didn't do this today. 06/01/18; the severe cellulitis on the left anterior thigh has resolved and he is completed his Keflex. ooThere is no open wound on the left leg however there is a superficial excoriation at the base of the third toe dorsally. Skin on the bottom of his left foot is macerated looking.   ooThe left the wounds on the lateral right leg actually looks some better although he did require debridement of the top half of this wound area with an open curet 06/09/18 on evaluation today patient appears to be doing poorly in regard to his right lower extremity in particular this appears to likely be infected he has very thick purulent discharge along with a bright green tent to the discharge. This makes me concerned about the possibility of pseudomonas. He's also having increased discomfort at this point on evaluation. Fortunately there does not appear to be any evidence of infection spreading to the other location at this time. 06/16/18 on evaluation today patient appears to actually be doing fairly well. His ulcer has actually diminished in size quite significantly at this point which is good news. Nonetheless he still does have some evidence of infection he did see infectious disease this morning before coming here for his appointment. I did review the results of their evaluation and their note today. They did actually have him  discontinue the Cipro and initiate treatment with linezolid at this time. He is doing this for the next seven days and they recommended a follow-up in four months with them. He is the keep a log of the need for intermittent antibiotic therapy between now and when he falls back up with infectious disease. This will help them gaze what exactly they need to do to try and help them out. 06/23/18; the patient arrives today with no open wounds on the left leg and left third toe healed. He is been using his compression pumps twice a day. On the right lateral leg he still has a sizable wound but this is a lot better than last time I saw this. 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 Has been using silver alginate under 4 layer compression 06/30/18; the only open wound he has is on the right lateral leg and this looks healthy. No debridement is required. We have been using silver alginate. He does not have an open wound on the left leg. There is apparently some drainage from the dorsal proximal third toe on the left although I see no open wound here. 07/03/18 on evaluation today patient was actually here just for a nurse visit rapid change. However when he was here on Wednesday for his rat change due to having been healed on the left and then developing blisters we initiated the wrap again knowing that he would be back today for [name] to reevaluate and see were at. Unfortunately he has developed some cellulitis into the proximal portion of his right lower extremity even into the region of his thigh. 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 He was placed on one as what at that point. Nonetheless he is done with that and has been tolerating it well otherwise. Doxycycline which in the past really did not seem to be effective for him. Nonetheless I think the best option may  be for [name] to definitely reinitiate the antibiotics for a longer period of time. 07/07/18; since I last saw this patient a week ago he has had a difficult time. At that point he did not have an open wound on his left leg. We transitioned him into juxta light stockings. He was apparently in the clinic the next day with blisters on the left lateral and left  medial lower calf. He also had weeping edema fluid. He was put back into a compression wrap. He was also in the clinic on Friday with intense erythema in his right thigh. Per the patient he was started on Bactrim however that didn't work at all in terms of relieving his pain and swelling. He has taken 3 doxycycline that he had left over from last time and that seems to of helped. He has blistering on the right thigh as well. 07/14/18; the erythema on his right thigh has gotten better with doxycycline that he is finishing. 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 Presumably this cellulitis in the thigh was not related to that although I think this is a bit concerning going forward. He still has an area on the right lateral calf the blister on the right medial calf just below the knee that was discussed above. On the left 2 small open areas left medial and left lateral. Edema control is adequate. He is using his compression pumps twice a day 07/20/18; continued improvement in the condition of both legs especially the edema in his bilateral thighs. He tells me he is been losing weight through a combination of diet and exercise. He is using his compression pumps twice a day. So overall she made to the remaining wounds 07/27/2018; continued improvement in condition of both legs. His edema is well controlled. The area on the right lateral leg is just about closed he had one blisters show up on the medial left upper calf. We have him in 4 layer compression. He is going on a 10-day trip to  [place], [place]. He will be driving. He wants to wear Unna boots because of the lessening amount of constriction. He will not use compression pumps while he is away 08/05/18 on evaluation today patient actually appears to be doing decently well all things considered in regard to his bilateral lower extremities. The worst ulcer is actually only posterior aspect of his left lower extremity with a four layer compression wrap cut into his leg a couple weeks back. He did have a trip and actually had [employer] for the trip that he is worn since he was last here. Nonetheless he feels like the [employer] actually do better for him his swelling is up a little bit but he also with his trip was not taking his Lasix on a regular set schedule like he was supposed to be. He states that obviously the reason being that he cannot drive and keep going without having to urinate too frequently which makes it difficult. He did not have his pumps with him while he was away either which I think also maybe playing a role here too. 08/13/2018; the patient only has a small open wound on the right lateral calf which is a big improvement in the last month or 2. He also has the area posteriorly just below the posterior fossa on the left which I think was a wrap injury from several weeks ago. He has no current evidence of cellulitis. He tells me he is back into his compression pumps twice a day. He also tells me that while he was at the laundromat somebody stole a section of his extremitease stockings 08/20/2018; back in the clinic with a much improved state. He only has small areas on the right lateral mid calf which is just about healed.  This was is more substantial area for quite a prolonged period of time. He has a small open area on the left anterior tibia. The area on the posterior calf just below the popliteal fossa is closed today. He is using his compression pumps twice a day 08/28/2018;  patient has no open wound on the right leg. He has a smattering of open areas on the calf with some weeping lymphedema. More problematically than that it looks as though his wraps of slipped down in his usual he has very angry upper area of edema just below the right medial knee and on the right lateral calf. He has no open area on his feet. The patient is traveling to [facility] next week. I will send him in an antibiotic. We will continue to wrap the right leg. We ordered extremitease stockings for him last week and I plan to transition the right leg to a stocking when he gets home which will be in 10 days time. As usual he is very reluctant to take his pumps with him when he travels 09/07/2018; patient returns from [name]. He shows me a picture of his left leg in the mid part of his trip last week with intense fire engine erythema. The picture look bad enough I would have considered sending him to the [name]. Instead he went to the [facility] care [name] in [facility] They did not prescribe him antibiotics but he did take some doxycycline he had leftover from a previous visit. I had given him trimethoprim sulfamethoxazole before he left this did not work according to the patient. This is resulted in some improvement fortunately. He comes back with a large wound on the left posterior calf. Smaller area on the left anterior tibia. Denuded blisters on the dorsal left foot over his toes. Does not have much in the way of wounds on the right leg although he does have a very tender area on the right posterior area just below the popliteal fossa also suggestive of infection. He promises me he is back on his pumps twice a day 09/15/2018; the intense cellulitis in his left lower calf is a lot better. The wound area on the posterior left calf is also so better. However he has reasonably extensive wounds on the dorsal aspect of his second and third toes and the proximal foot just at the base of  the toes. There is nothing open on the right leg 09/22/2018; the patient has excellent edema control in his legs bilaterally. He is using his external compression pumps twice a day. He has no open area on the right leg and only the areas in the left foot dorsally second and third toe area on the left side. He does not have any signs of active cellulitis. 10/06/2018; the patient has good edema control bilaterally. He has no open wound on the right leg. There is a blister in the posterior aspect of his left calf that we had to deal with today. He is using his compression pumps twice a day. There is no signs of active cellulitis. We have been using silver alginate to the wound areas. He still has vulnerable areas on the base of his left first second toes dorsally He has a his extremities stockings and we are going to transition him today into the stocking on the right leg. He is cautioned that he will need to continue to use the compression pumps twice a day. If he notices uncontrolled edema in the right  leg he may need to go to 3 times a day. 10/13/2018; the patient came in for a nurse check on Friday he has a large flaccid blister on the right medial calf just below the knee. We unroofed this. He has this and a new area underneath the posterior mid calf which was undoubtedly a blister as well. He also has several small areas on the right which is the area we put his extremities stocking on. 10/19/2018; the patient went to see infectious disease this morning I am not sure if that was a routine follow-up in any case the doxycycline I had given him was discontinued and started on linezolid. He has not started this. It is easy to look at his left calf and the inflammation and think this is cellulitis however he is very tender in the tissue just below the popliteal fossa and I have no doubt that there is infection going on here. He states the problem he is having is that with the compression pumps the edema  goes down and then starts walking the wrap falls down. We will see if we can adhere this. He has 1 or 2 minuscule open areas on the right still areas that are weeping on the posterior left calf, the base of his left second and third toes 10/26/18; back today in clinic with quite of skin breakdown in his left anterior leg. This may have been infection the area below the popliteal fossa seems a lot better however tremendous epithelial loss on the left anterior mid tibia area over quite inexpensive tissue. He has 2 blisters on the right side but no other open wound here. 10/29/2018; came in urgently to see [name] today and we worked him in for review. He states that the 4 layer compression on the right leg caused pain he had to cut it down to roughly his mid calf this caused swelling above the wrap and he has blisters and skin breakdown today. As a result of the pain he has not been using his pumps. Both legs are a lot more edematous and there is a lot of weeping fluid. 11/02/18; arrives in clinic with continued difficulties in the right leg> left. Leg is swollen and painful. multiple skin blisters and new open areas especially laterally. He has not been using his pumps on the right leg. 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 He is not systemically unwell. 11/09/2018; the patient claims he is being compliant with his pumps. He is finished the doxycycline I gave him last week. Culture I did of the wound on the right lateral leg showed a few very resistant methicillin staph aureus. This was resistant to doxycycline. Nevertheless he states the pain in the leg is a lot better which makes me wonder if the cultured organism was not really what was causing the problem nevertheless this is a very dangerous organism to be culturing out of any wound. His right leg is still a lot larger than the left. He is using an Radio broadcast assistant on this area, he blames a 4-layer compression  for causing the original skin breakdown which I doubt is true however I cannot talk him out of it. We have been using silver alginate to all of these areas which were initially blisters 11/16/2018; patient is being compliant with his external compression pumps at twice a day. Miraculously he arrives in clinic today with absolutely no open wounds. He has better edema control on the left  where he has been using 4 layer compression versus wound of wounds on the right and I pointed this out to him. There is no inflammation in the skin in his lower legs which is also somewhat unusual for him. There is no open wounds on the dorsal left foot. He has extremitease stockings at home and I have asked him to bring these in next week. 11/25/18 patient's lower extremity on examination today on the left appears for the most part to be wound free. He does have an open wound on the lateral aspect of the right lower extremity but this is minimal compared to what I've seen in past. He does request that we go ahead and wrap the left leg as well even though there's nothing open just so hopefully it will not reopen in short order. 1/28; patient has superficial open wounds on the right lateral calf left anterior calf and left posterior calf. His edema control is adequate. 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 We have been using silver alginate as the primary dressing. He claims compliance with his compression pumps 2/4; patient has superficial open wounds on numerous areas of his left calf and again one on the left dorsal foot. The areas on the right lateral calf have healed. The cellulitis that I gave him doxycycline for last week is also resolved this was mostly on the left anterior calf just below the tibial tuberosity. His edema looks fairly well-controlled. He tells me he went to see his primary doctor today and had blood work  ordered 2/11; once again he has several open areas on the left calf left tibial area. Most of these are small and appear to have healthy granulation. He does not have anything open on the right. The edema and control in his thighs is pretty good which is usually a good indication he has been using his pumps as requested. 2/18; he continues to have several small areas on the left calf and left tibial area. Most of these are small healthy granulation. We put him in his stocking on the right leg last week and he arrives with a superficial open area over the right upper tibia and a fairly large area on the right lateral tibia in similar condition. His edema control actually does not look too bad, he claims to be using his compression pumps twice a day 2/25. Continued small areas on the left calf and left tibial area. New areas especially on the right are identified just below the tibial tuberosity and on the right upper tibia itself. There are also areas of weeping edema fluid even without an obvious wound. He does not have a considerable degree of lymphedema but clearly there is more edema here than his skin can handle. He states he is using the pumps twice a day. We have an Unna boot on the right and 4 layer compression on the left. 3/3; he continues to have an area on the right lateral calf and right posterior calf just below the popliteal fossa. There is a fair amount of tenderness around the wound on the popliteal fossa but I did not see any evidence of cellulitis, could just be that the wrap came down and rubbed in this area. ooHe does not have an open area on the left leg however there is an area on the left dorsal foot at the base of the third toe ooWe have been  using silver alginate to all wound areas 3/10; he did not have an open area on his left leg last time he was here a week ago. T oday he arrives with a horizontal wound just below the tibial tuberosity and an area on the left lateral  calf. He has intense erythema and tenderness in this area. The area is on the right lateral calf and right posterior calf better than last week. We have been using silver alginate as usual 3/18 - Patient returns with 3 small open areas on left calf, and 1 small open area on right calf, the skin looks ok with no significant erythema, he continues the UNA boot on right and 4 layer compression on left. The right lateral calf wound is closed , the right posterior is small area. we will continue silver alginate to the areas. 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 He is been tolerating the dressing changes without complication. Fortunately she has made excellent improvement in regard to the overall status of his wounds. Unfortunately every time we cease wrapping him he ends up reopening in causing more significant issues at that point. Again I'm unsure of the best direction to take although I think the lymphedema clinic may be appropriate for him. 02/03/19 on evaluation today patient appears to be doing well in regard to the wounds that we saw him for last week unfortunately he has a new area on the proximal portion of his right medial/posterior lower extremity where the wrap somewhat slowed down and caused swelling and a blister to rub and open. Unfortunately this is the only opening that he has on either leg at this point. 02/17/19 on evaluation today patient's bilateral lower extremities appear to be doing well. He still completely healed in regard to the left lower extremity. In regard to the right lower extremity the area where the wrap and slid down and caused the blister still seems to be slightly open although this is dramatically better than during the last evaluation two weeks ago. I'm very pleased with  the way this stands overall. 03/03/19 on evaluation today patient appears to be doing well in regard to his right lower extremity in general although he did have a new blister open this does not appear to be showing any evidence of active infection at this time. Fortunately there's No fevers, chills, nausea, or vomiting noted at this time. Overall I feel like he is making good progress it does feel like that the right leg will we perform the [employer] seems to do with a bit better than three layer wrap on the left which slid down on him. We may switch to doing bilateral in the book wraps. 5/4; I have not seen Mr. Rondon in quite some time. According to our case manager he did not have an open wound on his left leg last week. He had 1 remaining wound on the right posterior medial calf. He arrives today with multiple openings on the left leg probably were blisters and/or wrap injuries from Unna boots. I do not think the Unna boot's will provide adequate compression on the left. I am also not clear about the frequency he is using the compression pumps. 03/17/19 on evaluation today patient appears to be doing excellent in regard to his lower extremities compared to last  week's evaluation apparently. He had gotten significantly worse last week which is unfortunate. The [employer] wrap on the left did not seem to do very well for him at all and in fact it didn't control his swelling significantly enough he had an additional outbreak. Subsequently we go back to the four layer compression wrap on the left. This is good news. At least in that he is doing better and the wound seem to be killing him. He still has not heard anything from the lymphedema clinic. 03/24/19 on evaluation today patient actually appears to be doing much better in regard to his bilateral lower Trinity as compared to last week when I saw him. Fortunately there's no signs of active infection at this time. He has been tolerating the dressing  changes without complication. Overall I'm extremely pleased with the progress and appearance in general. 04/07/19 on evaluation today patient appears to be doing well in regard to his bilateral lower extremities. His swelling is significantly down from where it was previous. With that being said he does have a couple blisters still open at this point but fortunately nothing that seems to be too severe and again the majority of the larger openings has healed at this time. 04/14/19 on evaluation today patient actually appears to be doing quite well in regard to his bilateral lower extremities in fact I'm not even sure there's anything significantly open at this time at any site. Nonetheless he did have some trouble with these wraps where they are somewhat irritating him secondary to the fact that he has noted that the graph wasn't too close down to the end of this foot in a little bit short as well up to his knee. Otherwise things seem to be doing quite well. 04/21/19 upon evaluation today patient's wound bed actually showed evidence of being completely healed in regard to both lower extremities which is excellent news. There does not appear to be any signs of active infection which is also good news. I'm very pleased in this regard. No fevers, chills, nausea, or vomiting noted at this time. 04/28/19 on evaluation today patient appears to be doing a little bit worse in regard to both lower extremities on the left mainly due to the fact that when he went infection disease the wrap was not wrapped quite high enough he developed a blister above this. On the right he is a small open area of nothing too significant but again this is continuing to give him some trouble he has been were in the Velcro compression that he has at home. 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 He's been tolerating the dressing changes without complication. Fortunately  there's no signs of active infection at this time. No fevers, chills, nausea, or vomiting noted at this time. We have been trying to get an appointment with her lymphedema clinic in [name] but unfortunately nobody can get them on phone with not been able to even fax information over the patient likewise is not been able to get in touch with them. Overall I'm not sure exactly what's going on here with to reach out again today. 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 Still having a lot of drainage unfortunately. He tells me especially in the left but even on the right. There's no signs of active infection which is good news we've been  using so ratcheted up to this point. 05/19/19 on evaluation today patient actually appears to be doing quite well with regard to his left lower extremity which is great news. Fortunately in regard to the right lower extremity has an issues with his wrap and he subsequently did remove this from what I'm understanding. Nonetheless long story short is what he had rewrapped once he removed it subsequently had maggots underneath this wrap whenever he came in for evaluation today. With that being said they were obviously completely cleaned away by the nursing staff. The visit today which is excellent news. However he does appear to potentially have some infection around the right ankle region where the maggots were located as well. He will likely require anabiotic therapy today. 05/26/19 on evaluation today patient actually appears to be doing much better in regard to his bilateral lower extremities. I feel like the infection is under much better control. With that being said there were maggots noted when the wrap was removed yet again today. Again this could have potentially been left over from previous although at this time there does not appear to be any signs of  significant drainage there was obviously on the wrap some drainage as well this contracted gnats or otherwise. Either way I do not see anything that appears to be doing worse in my pinion and in fact I think his drainage has slowed down quite significantly likely mainly due to the fact to his infection being under better control. 06/02/2019 on evaluation today patient actually appears to be doing well with regard to his bilateral lower extremities there is no signs of active infection at this time which is great news. With that being said he does have several open areas more so on the right than the left but nonetheless these are all significantly better than previously noted. 06/09/2019 on evaluation today patient actually appears to be doing well. His wrap stayed up and he did not cause any problems he had more drainage on the right compared to the left but overall I do not see any major issues at this time which is great news. 06/16/2019 on evaluation today patient appears to be doing excellent with regard to his lower extremities the only area that is open is a new blister that can have opened as of today on the medial ankle on the left. Other than this he really seems to be doing great I see no major issues at this point. 06/23/2019 on evaluation today patient appears to be doing quite well with regard to his bilateral lower extremities. In fact he actually appears to be almost completely healed there is a small area of weeping noted of the right lower extremity just above the ankle. Nonetheless fortunately there is no signs of active infection at this time which is good news. No fevers, chills, nausea, vomiting, or diarrhea. 8/24; the patient arrived for a nurse visit today but complained of very significant pain in the left leg and therefore I was asked to look at this. Noted that he did not have an open area on the left leg last week nevertheless this was wrapped. The patient states that he is not  been able to put his compression pumps on the left leg because of the discomfort. He has not been systemically unwell 06/30/2019 on evaluation today patient unfortunately despite being excellent last week is doing much worse with regard to his left lower extremity today. In fact he had to come in for a nurse  on Monday where his left leg had to be rewrapped due to excessive weeping [name] placed him on doxycycline at that point. Fortunately there is no signs of active infection Systemically at this time which is good news. 07/07/2019 in regard to the patient's wounds today he actually seems to be doing well with his right lower extremity there really is nothing open or draining at this point this is great news. Unfortunately the left lower extremity is given him additional trouble at this time. There does not appear to be any signs of active infection nonetheless he does have a lot of edema and swelling noted at this point as well as blistering all of which has led to a much more poor appearing leg at this time compared to where it was 2 weeks ago when it was almost completely healed. Obviously this is a little discouraging for the patient. He is try to contact the lymphedema clinic in [place] he has not been able to get through to them. 07/14/2019 on evaluation today patient actually appears to be doing slightly better with regard to his left lower extremity ulcers. Overall I do feel like at least at the top of the wrap that we have been placing this area has healed quite nicely and looks much better. The remainder of the leg is showing signs of improvement. Unfortunately in the thigh area he still has an open region on the left and again on the right he has been utilizing just a Band-Aid on an area that also opened on the thigh. Again this is an area that were not able to wrap although we did do an Ace wrap to provide some compression that something that obviously is a little less effective than  the compression wraps we have been using on the lower portion of the leg. He does have an appointment with the lymphedema clinic in [name] on Friday. 07/21/2019 on evaluation today patient appears to be doing better with regard to his lower extremity ulcers. He has been tolerating the dressing changes without complication. Fortunately there is no signs of active infection at this time. No fevers, chills, nausea, vomiting, or diarrhea. I did receive the paperwork from the physical therapist at the lymphedema clinic in [place]. Subsequently I signed off on that this morning and sent that back to him for further progression with the treatment plan. 07/28/2019 on evaluation today patient appears to be doing very well with regard to his right lower extremity where I do not see any open wounds at this point. Fortunately he is feeling great as far as that is concerned as well. In regard to the left lower extremity he has been having issues with still several areas of weeping and edema although the upper leg is doing better his lower leg still I think is going require the compression wrap at this time. No fevers, chills, nausea, vomiting, or diarrhea. 08/04/2019 on evaluation today patient unfortunately is having new wounds on the right lower extremity. Again we have been using Unna boot wrap on that side. We switched him to using his juxta lite wrap at home. With that being said he tells me he has been using it although his legs extremely swollen and to be honest really does not appear that he has been. I cannot know that for sure however. Nonetheless he has multiple new wounds on the right lower extremity at this time. Obviously we will have to see about getting this rewrapped for him today. 08/11/2019 on evaluation  today patient appears to be doing fairly well with regard to his wounds. He has been tolerating the dressing changes including the compression wraps without complication. He still has a  lot of edema in his upper thigh regions bilaterally he is supposed to be seeing the lymphedema clinic on the 15th of this month once his wraps arrive for the upper part of his legs. 08/18/2019 on evaluation today patient appears to be doing well with regard to his bilateral lower extremities at this point. He has been tolerating the dressing changes without complication. Fortunately there is no signs of active infection which is also good news. He does have a couple weeping areas on the first and second toe of the right foot he also has just a small area on the left foot upper leg and a small area on the left lower leg but overall he is doing quite well in my opinion. He is supposed to be getting his wraps shortly in fact tomorrow and then subsequently is seeing the lymphedema clinic next Wednesday on the 21st. Of note he is also leaving on the 25th to go on vacation [place] a [place] to the beach. For that reason and since there is some uncertainty about what there can be doing at lymphedema clinic next Wednesday I am get a make an appointment for next Friday here for [name] to see what we need to do for him prior to him leaving for vacation. 10/23; patient arrives in considerable pain predominantly in the upper posterior calf just distal to the popliteal fossa also in the wound anteriorly above the major wound. This is probably cellulitis and he has had this recurrently in the past. He has no open wound on the right side and he has had an Radio broadcast assistant in that area. Finally I note that he has an area on the left posterior calf which by enlarge is mostly epithelialized. This protrudes beyond the borders of the surrounding skin in the setting of dry scaly skin and lymphedema. The patient is leaving for [name] on Sunday. Per his longstanding pattern, he will not take his compression pumps with him predominantly out of fear that they will be stolen. He therefore asked that we put a Unna boot back on the right  leg. He will also contact the [facility] care [name] in [name] to see if they can change his dressing in the mid week. 11/3; patient returned from his vacation to [name]. He was seen on 1 occasion at their wound care [name]. They did a 2 layer compression system as they did not have our 4-layer wrap. I am not completely certain what they put on the wounds. They did not change the Unna boot on the right. The patient is also seeing a lymphedema specialist physical therapist in [place]. It appears that he has some compression sleeve for his thighs which indeed look quite a bit better than I am used to seeing. He pumps over these with his external compression pumps. 11/10; the patient has a new wound on the right medial thigh otherwise there is no open areas on the right. He has an area on the left leg posteriorly anteriorly and medially and an area over the left second toe. We have been using silver alginate. He thinks the injury on his thigh is secondary to friction from the compression sleeve he has. 11/17; the patient has a new wound on the right medial thigh last week. He thinks this is because he did  not have a underlying stocking for his thigh juxta lite apparatus. He now has this. The area is fairly large and somewhat angry but I do not think he has underlying cellulitis. ooHe has a intact blister on the right anterior tibial area. ooSmall wound on the right great toe dorsally ooSmall area on the medial left calf. 11/30; the patient does not have any open areas on his right leg and we did not take his juxta lite stocking off. However he states that on Friday his compression wrap fell down lodging around his upper mid calf area. As usual this creates a lot of problems for him. He called urgently today to be seen for a nurse visit however the nurse visit turned into a provider visit because of extreme erythema and pain in the left anterior tibia extending laterally and posteriorly.  The area that is problematic is extensive 10/06/2019 upon evaluation today patient actually appears to be doing poorly in regard to his left lower extremity. He [name] did place him on doxycycline this past Monday apparently due to the fact that he was doing much worse in regard to this left leg. Fortunately the doxycycline does seem to be helping. Unfortunately we are still having a very difficult time getting his edema under any type of control in order to anticipate discharge at some point. The only way were really able to control his lymphedema really is with compression wraps and that has only even seemingly temporary. He has been seeing a lymphedema clinic they are trying to help in this regard but still this has been somewhat frustrating in general for the patient. 10/13/19 on evaluation today patient appears to be doing excellent with regard to his right lower extremity as far as the wounds are concerned. His swelling is still quite extensive unfortunately. He is still having a lot of drainage from the thigh areas bilaterally which is unfortunate. He's been going to lymphedema clinic but again he still really does not have this edema under control as far as his lower extremities are concern. With regard to his left lower extremity this seems to be improving and I do believe the doxycycline has been of benefit for him. He is about to complete the doxycycline. 10/20/2019 on evaluation today patient appears to be doing poorly in regard to his bilateral lower extremities. More in the right thigh he has a lot of irritation at this site unfortunately. In regard to the left lower extremity the wrap was not quite as high it appears and does seem to have caused him some trouble as well. Fortunately there is no evidence of systemic infection though he does have some blue-green drainage which has me concerned for the possibility of Pseudomonas. He tells me he is previously taking Cipro without  complications and he really does not care for Levaquin however due to some of the side effects he has. He is not allergic to any medications specifically antibiotics that were aware of. 10/27/2019 on evaluation today patient actually does appear to be for the most part doing better when compared to last week's evaluation. With that being said he still has multiple open wounds over the bilateral lower extremities. He actually forgot to start taking the Cipro and states that he still has the whole bottle. He does have several new blisters on left lower extremity today I think I would recommend he go ahead and take the Cipro based on what I am seeing at this point. 12/30-Patient comes at 1 week  visit, 4 layer compression wraps on the left and Unna boot on the right, primary dressing Xtrasorb and silver alginate. Patient is taking his Cipro and has a few more days left probably 5-6, and the legs are doing better. He states he is using his compressions devices which I believe he has 11/10/2019 on evaluation today patient actually appears to be much better than last time I saw him 2 weeks ago. His wounds are significantly improved and overall I am very pleased in this regard. Fortunately there is no signs of active infection at this time. He is just a couple of days away from completing Cipro. Overall his edema is much better he has been using his lymphedema pumps which I think is also helping at this point. 11/17/2019 on evaluation today patient appears to be doing excellent in regard to his wounds in general. His legs are swollen but not nearly as much as they have been in the past. Fortunately he is tolerating the compression wraps without complication. No fevers, chills, nausea, vomiting, or diarrhea. He does have some erythema however in the distal portion of his right lower extremity specifically around the forefoot and toes there is a little bit of warmth here as well. 11/24/2019 on evaluation today  patient appears to be doing well with regard to his right lower extremity I really do not see any open wounds at this point. His left lower extremity does have several open areas and his right medial thigh also is open. Other than this however overall the patient seems to be making good progress and I am very pleased at this point. 12/01/2019 on evaluation today patient appears to be doing poorly at this point in regard to his left lower extremity has several new blisters despite the fact that we have him in compression wraps. In fact he had a 4-layer compression wrap, his upper thigh wrapped from lymphedema clinic, and a juxta light over top of the 4 layer compression wrap the lymphedema clinic applied and despite all this he still develop blisters underneath. Obviously this does have me concerned about the fact that unfortunately despite what we are doing to try to get wounds healed he continues to have new areas arise I do not think he is ever good to be at the point where he can realistically just use wraps at home to keep things under control. Typically when we heal him it takes about 1-2 days before he is back in the clinic with severe breakdown and blistering of his lower extremities bilaterally. This is happened numerous times in the past. Unfortunately I think that we may need some help as far as overall fluid overload to kind of limit what we are seeing and get things under better control. 12/08/2019 on evaluation today patient presents for follow-up concerning his ongoing bilateral lower extremity edema. Unfortunately he is still having quite a bit of swelling the compression wraps are controlling this to some degree but he did see [name] his cardiologist I do have that available for review today as far as the appointment was concerned that was on 12/06/2019. Obviously that she has been 2 days ago. The patient states that he is only been taking the Lasix 80 mg 1 time a day he had told me  previously he was taking this twice a day. Nonetheless [name] recommended this be up to 80 mg 2 times a day for the patient as he did appear to be fluid overloaded. With that being said  the patient states he did this yesterday and he was unable to go anywhere or do anything due to the fact that he was constantly having to urinate. Nonetheless I think that this is still good to be something that is important for him as far as trying to get his edema under control at all things that he is going to be able to just expect his wounds to get under control and things to be better without going through at least a period of time where he is trying to stabilize his fluid management in general and I think increasing the Lasix is likely the first step here. It was also mentioned the possibility that the patient may require metolazone. With that being said he wanted to have the patient take Lasix twice a day first and then reevaluating 2 months to see where things stand. 12/15/2019 upon evaluation today patient appears to be doing regard to his legs although his toes are showing some signs of weeping especially on the left at this point to some degree on the right. There does not appear to be any signs of active infection and overall I do feel like the compression wraps are doing well for him but he has not been able to take the Lasix at home and the increased dose that [name] recommended. He tells me that just not go to be feasible for him. Nonetheless I think in this case he should probably send a message to [name] in order to discuss options from the standpoint of possible admission to get the fluid off or otherwise going forward. 12/22/2019 upon evaluation today patient appears to be doing fairly well with regard to his lower extremities at this point. In fact he would be doing excellent if it was not for the fact that his right anterior thigh apparently had an allergic reaction to adhesive tape that he  used. The wound itself that we have been monitoring actually appears to be healed. There is a lot of irritation at this point. 12/29/2019 upon evaluation today patient appears to be doing well in regard to his lower extremities. His left medial thigh is open and somewhat draining today but this is the only region that is open the right has done much better with the treatment utilizing the steroid cream that I prescribed for him last week. Overall I am pleased in that regard. Fortunately there is no signs of active infection at this time. No fevers, chills, nausea, vomiting, or diarrhea. 01/05/2020 upon evaluation today patient appears to be doing more poorly in regard to his right lower extremity at this point upon evaluation today. Unfortunately he continues to have issues in this regard and I think the biggest issue is controlling his edema. This obviously is not very well controlled at this point is been recommended that he use the Lasix twice a day but he has not been able to do that. Unfortunately I think this is leading to an issue where honestly he is not really able to effectively control his edema and therefore the wounds really are not doing significantly better. I do not think that he is going to be able to keep things under good control unless he is able to control his edema much better. I discussed this again in great detail with him today. 01/12/2020 good news is patient actually appears to be doing quite well today at this point. He does have an appointment with lymphedema clinic tomorrow. His legs appear healed and the  toe on the left is almost completely healed. In general I am very pleased with how things stand at this point. 01/19/2020 upon evaluation today patient appears to actually be doing well in regard to his lower extremities there is nothing open at this point. Fortunately he has done extremely well more recently. Has been seeing lymphedema clinic as well. With that being said he  has Velcro wraps for his lower legs as well as his upper legs. The only wound really is on his toe which is the right great toe and this is barely anything even there. With all that being said I think it is good to be appropriate today to go ahead and switch him over to the Velcro compression wraps. 01/26/2020 upon evaluation today patient appears to be doing worse with regard to his lower extremities after last week switch him to Velcro compression wraps. Unfortunately he lasted less than 24 hours he did not have the sock portion of his Velcro wrap on the left leg and subsequently developed a blister underneath the Velcro portion. Obviously this is not good and not what we were looking for at this point. He states the lymphedema clinic did tell him to wear the wrap for 23 hours and take him off for 1 I am okay with that plan but again right now we got a get things back under control again he may have some cellulitis noted as well. 02/02/2020 upon evaluation today patient unfortunately appears to have several areas of blistering on his bilateral lower extremities today mainly on the feet. His legs do seem to be doing somewhat better which is good news. Fortunately there is no evidence of active infection at this time. No fevers, chills, nausea, vomiting, or diarrhea. 02/16/2020 upon evaluation today patient appears to be doing well at this time with regard to his legs. He has a couple weeping areas on his toes but for the most part everything is doing better and does appear to be sealed up on his legs which is excellent news. We can continue with wrapping him at this point as he had every time we discontinue the wraps he just breaks out with new wounds. There is really no point in is going forward with this at this point. 03/08/2020 upon evaluation today patient actually appears to be doing quite well with regard to his lower extremity ulcers. He has just a very superficial and really almost nonexistent  blister on the left lower extremity he has in general done very well with the compression wraps. With that being said I do not see any signs of infection at this time which is good news. 03/29/2020 upon evaluation today patient appears to be doing well with regard to his wounds currently except for where he had several new areas that opened up due to some of the wrap slipping and causing him trouble. He states he did not realize they had slipped. Nonetheless he has a 1 area on the right and 3 new areas on the left. Fortunately there is no signs of active infection at this time which is great news. 04/05/2020 upon evaluation today patient actually appears to be doing quite well in general in regard to his legs currently. Fortunately there is no signs of active infection at this time. No fevers, chills, nausea, vomiting, or diarrhea. He tells me next week that he will actually be seen in the lymphedema clinic on Thursday at 10 AM I see him on Wednesday next week. Objective Constitutional Obese  and well-hydrated in no acute distress. Vitals Time Taken: 10:20 AM, Height: 70 in, Weight: 380.2 lbs, BMI: 54.5, Temperature: 97.8 F, Pulse: 55 bpm, Respiratory Rate: 18 breaths/min, Blood Pressure: 156/57 mmHg. Respiratory normal breathing without difficulty. Psychiatric this patient is able to make decisions and demonstrates good insight into disease process. Alert and Oriented x 3. pleasant and cooperative. General Notes: Upon inspection patient's wound bed actually showed signs of fairly good epithelization and everything appears to be quite dry compared to previous. I feel like he is getting very close to complete resolution. If we can get him completely resolved by next week that would be optimal is then he would be able to see [name], put him in his Velcro wraps, and then have him follow-up with the lymphedema clinic the next day to make sure he things seems to be doing well. Integumentary (Hair,  Skin) Wound #168 status is Healed - Epithelialized. Original cause of wound was Blister. The wound is located on the Right,Posterior Lower Leg. The wound measures 0cm length x 0cm width x 0cm depth; 0cm^2 area and 0cm^3 volume. There is no tunneling or undermining noted. There is a none present amount of drainage noted. There is no granulation within the wound bed. There is no necrotic tissue within the wound bed. Wound #169 status is Open. Original cause of wound was Gradually Appeared. The wound is located on the Left,Dorsal Foot. The wound measures 2.5cm length x 2cm width x 0.1cm depth; 3.927cm^2 area and 0.393cm^3 volume. There is Fat Layer (Subcutaneous Tissue) Exposed exposed. There is no tunneling or undermining noted. There is a medium amount of serosanguineous drainage noted. The wound margin is flat and intact. There is large (67-100%) red granulation within the wound bed. There is no necrotic tissue within the wound bed. Wound #170 status is Open. Original cause of wound was Gradually Appeared. The wound is located on the Left,Distal,Dorsal Foot. The wound measures 1.5cm length x 1.2cm width x 0.1cm depth; 1.414cm^2 area and 0.141cm^3 volume. There is Fat Layer (Subcutaneous Tissue) Exposed exposed. There is no tunneling or undermining noted. There is a small amount of serosanguineous drainage noted. The wound margin is flat and intact. There is large (67-100%) red granulation within the wound bed. There is no necrotic tissue within the wound bed. Wound #171 status is Healed - Epithelialized. Original cause of wound was Gradually Appeared. The wound is located on the Left,Medial Malleolus. The wound measures 0cm length x 0cm width x 0cm depth; 0cm^2 area and 0cm^3 volume. 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 There was a Four Layer Compression Therapy Procedure by Yevonne Pax, RN. Post procedure Diagnosis Wound #170: Same as Pre-Procedure There was a Radio broadcast assistant Compression Therapy Procedure by Yevonne Pax, RN. Post procedure Diagnosis Wound #: Same as Pre-Procedure Plan Follow-up Appointments: Return Appointment in 1 week. Dressing Change Frequency: Do not change entire dressing for one week. - both legs Skin Barriers/Peri-Wound Care: Antifungal cream - to both feet Moisturizing lotion - mixed with TCA cream to legs Wound Cleansing:  May shower with protection. Primary Wound Dressing: Wound #169 Left,Dorsal Foot: Calcium Alginate with Silver Wound #170 Left,Distal,Dorsal Foot: Calcium Alginate with Silver Secondary Dressing: Wound #169 Left,Dorsal Foot: Dry Gauze Wound #170 Left,Distal,Dorsal Foot: Dry Gauze Edema Control: 4 layer compression: Left lower extremity Unna Boot to Right Lower Extremity Avoid standing for long periods of time Elevate legs to the level of the heart or above for 30 minutes daily and/or when sitting, a frequency of: - whenever sitting Exercise regularly Segmental Compressive Device. - lymphadema pumps 60 minutes 2 times per day Other: - If feet or wraps become wet, remove wrap and apply juxtalites Additional Orders / Instructions: Other: - take lasix as prescribed by cardiologist 1. I would recommend currently that we continue with the wraps per last week's orders continuing for the time being. This is a Radio broadcast assistant wrap on the right lower extremity and a 4-layer compression  wrap on the left lower extremity. 2. I am also can recommend the patient needs to continue to elevate his legs he also needs to exercise regularly I am not sure that he is doing much of either to be honest. He should also be using his lymphedema pumps. 3. I also did discuss with the patient that obviously we need to try to continue to take the Lasix he also needs to as much as possible be prepared for the fact that we need to try to transition him into the Velcro wraps as again we have been compression wrapping him for quite some time and if he is healed by next week I think that will be a good time to make that transition as we would do that on Wednesday and that he will be following up with the lymphedema clinic on Thursday. We will see patient back for reevaluation in 1 week here in the clinic. If anything worsens or changes patient will contact our office for additional recommendations. Electronic Signature(s) Signed: 04/05/2020 11:21:26 AM By: Lenda Kelp PA-C Entered By: Lenda Kelp on 04/05/2020 11:21:26 -------------------------------------------------------------------------------- SuperBill Details Patient Name: Date of Service: [name], Emidio J. 04/05/2020 Medical Record Number: [id number] Patient Account Number: [id number] Date of Birth/Sex: Treating RN: [birth year] (69 y.o. [name] Primary Care Provider: Marney Setting, [name] Other Clinician: Referring Provider: Treating Provider/Extender: Lenda Kelp Millard [place] [name] [name], [name] in Treatment: 219 Diagnosis Coding ICD-10 Codes Code Description [phone] Non-pressure chronic ulcer of right calf limited to breakdown of skin L97.221 Non-pressure chronic ulcer of left calf limited to breakdown of skin 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 of bilateral lower extremity I89.0 Lymphedema, not elsewhere classified E11.622 Type 2 diabetes mellitus with other skin ulcer E11.40 Type 2 diabetes  mellitus with diabetic neuropathy, unspecified L03.116 Cellulitis of left lower limb Facility Procedures CPT4 Code: [id number] Description: (Facility Use Only) [id number] - APPLY UNNA BOOT RT Modifier: Quantity: 1 CPT4 Code: [id number] Description: (Facility Use Only) 29581LT - APPLY MULTLAY COMPRS LWR LT LEG Modifier: 59 Quantity: 1 Physician Procedures : CPT4 Code Description Modifier [id number] - WC PHYS LEVEL 3 - EST PT ICD-10 Diagnosis Description 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 skin L97.221 Non-pressure chronic ulcer of left calf limited to breakdown of  skin 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 of bilateral lower extremity I89.0 Lymphedema, not elsewhere classified Quantity: 1 Electronic Signature(s) Signed: 04/05/2020 11:21:45 AM By: Lenda Kelp PA-C Entered By: Lenda Kelp on 04/05/2020 11:21:42

## 2020-04-06 ENCOUNTER — Ambulatory Visit: Payer: Medicare Other | Admitting: Family Medicine
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
## 2020-04-12 ENCOUNTER — Other Ambulatory Visit: Payer: Self-pay | Admitting: Family Medicine

## 2020-04-12 ENCOUNTER — Ambulatory Visit (INDEPENDENT_AMBULATORY_CARE_PROVIDER_SITE_OTHER): Payer: Medicare Other | Admitting: Family Medicine

## 2020-04-12 ENCOUNTER — Other Ambulatory Visit: Payer: Self-pay

## 2020-04-12 ENCOUNTER — Encounter (HOSPITAL_BASED_OUTPATIENT_CLINIC_OR_DEPARTMENT_OTHER): Payer: Medicare Other | Admitting: Physician Assistant

## 2020-04-12 ENCOUNTER — Encounter: Payer: Self-pay | Admitting: Family Medicine

## 2020-04-12 VITALS — BP 135/66 | HR 52 | Temp 97.9°F | Resp 16 | Ht 69.0 in | Wt 380.6 lb

## 2020-04-12 DIAGNOSIS — L97221 Non-pressure chronic ulcer of left calf limited to breakdown of skin: Secondary | ICD-10-CM | POA: Diagnosis not present

## 2020-04-12 DIAGNOSIS — Z79899 Other long term (current) drug therapy: Secondary | ICD-10-CM | POA: Diagnosis not present

## 2020-04-12 DIAGNOSIS — L97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 right calf limited to breakdown of skin: Secondary | ICD-10-CM | POA: Diagnosis not present

## 2020-04-12 DIAGNOSIS — E11622 Type 2 diabetes mellitus with other skin ulcer: Secondary | ICD-10-CM | POA: Diagnosis not present

## 2020-04-12 DIAGNOSIS — N1831 Chronic kidney disease, stage 3a: Secondary | ICD-10-CM | POA: Diagnosis not present

## 2020-04-12 DIAGNOSIS — E118 Type 2 diabetes mellitus with unspecified complications: Secondary | ICD-10-CM

## 2020-04-12 DIAGNOSIS — D649 Anemia, unspecified: Secondary | ICD-10-CM | POA: Diagnosis not present

## 2020-04-12 DIAGNOSIS — I872 Venous insufficiency (chronic) (peripheral): Secondary | ICD-10-CM

## 2020-04-12 DIAGNOSIS — Z7901 Long term (current) use of anticoagulants: Secondary | ICD-10-CM

## 2020-04-12 DIAGNOSIS — Z794 Long term (current) use of insulin: Secondary | ICD-10-CM | POA: Diagnosis not present

## 2020-04-12 DIAGNOSIS — L0390 Cellulitis, unspecified: Secondary | ICD-10-CM | POA: Diagnosis not present

## 2020-04-12 DIAGNOSIS — E1151 Type 2 diabetes mellitus with diabetic peripheral angiopathy without gangrene: Secondary | ICD-10-CM | POA: Diagnosis not present

## 2020-04-12 DIAGNOSIS — E1121 Type 2 diabetes mellitus with diabetic nephropathy: Secondary | ICD-10-CM | POA: Diagnosis not present

## 2020-04-12 DIAGNOSIS — L03119 Cellulitis of unspecified part of limb: Secondary | ICD-10-CM

## 2020-04-12 DIAGNOSIS — E1140 Type 2 diabetes mellitus with diabetic neuropathy, unspecified: Secondary | ICD-10-CM | POA: Diagnosis not present

## 2020-04-12 DIAGNOSIS — I890 Lymphedema, not elsewhere classified: Secondary | ICD-10-CM | POA: Diagnosis not present

## 2020-04-12 DIAGNOSIS — I87333 Chronic venous hypertension (idiopathic) with ulcer and inflammation of bilateral lower extremity: Secondary | ICD-10-CM | POA: Diagnosis not present

## 2020-04-12 DIAGNOSIS — I480 Paroxysmal atrial fibrillation: Secondary | ICD-10-CM

## 2020-04-12 DIAGNOSIS — L03116 Cellulitis of left lower limb: Secondary | ICD-10-CM | POA: Diagnosis not present

## 2020-04-12 DIAGNOSIS — G894 Chronic pain syndrome: Secondary | ICD-10-CM

## 2020-04-12 MED ORDER — RIVAROXABAN 20 MG PO TABS: ORAL_TABLET | ORAL | 3 refills | Status: DC

## 2020-04-12 MED ORDER — RIVAROXABAN 20 MG PO TABS: ORAL_TABLET | ORAL | 0 refills | Status: DC

## 2020-04-12 MED ORDER — HYDROCODONE-ACETAMINOPHEN 5-325 MG PO TABS: ORAL_TABLET | ORAL | 0 refills | Status: DC

## 2020-04-12 NOTE — Patient Instructions (Signed)
Call your insurer and ask if eliquis is the preferred direct oral anticoagulant on their formulary.  Compare this cost to your xarelto.

## 2020-04-12 NOTE — Progress Notes (Signed)
OFFICE VISIT  04/12/2020   CC:  Chief Complaint  Patient presents with  . Follow-up    [name], pt is fasting   HPI:    Patient is a 69 y.o. Caucasian male who presents for 3 mo f/u DM, HTN, CRI III. He is on rate control and DOAC for chronic atrial fibrillation.  A/P as of last visit: "1) DM, not well controlled based on home glucoses. Hba1c check today and we'll see what changes need to be made. Of note, we discussed benefits of statin use for high risk pt's like him, showing benefit even in pt's with normal cholesterol levels (Heart Protection Stud).  He declined to add statin at this time.  2) HTN; The current medical regimen is effective;  continue present plan and medications. Lytes/cr today.  3) CRI III: avoids NSAIDs.  Flud balance is fair. Lytes/cr today.  4) A-fib: regular rhythm today.  Doing well on xarelto. CBC monitoring today.  5) Migraine HA syndrome: responds well to fioricet used sparingly over the long term. RF'd this rx today.  6) Chronic pain syndrome: back. The current medical regimen is effective;  continue present plan and medications. No new rx for this today.  7) LE venous insufficiency edema + lymphedema, hx of chronic cellulitis: Continue with wound care clinic, lymphedema clinic, and today we gave his monthly penicillin G injection as per ID clinic rec's."  INTERIM HX: He is in medicare "gap" and xarelto is 400$/mo, wants rx faxed to [facility] in [place] for cheaper price until donut hole ends.  June 1 [facility] clinic labs reviewed on pt's smartphone: A1c 7.3%, TSH 5.352, Insulin level10.  LDL direct 58, Tchol 101, trig 71, HDL 31, Tchol/HDL ratio is 3.3. K 4.5, Na, Bicarb, chl, BUN, Ca all normal.  sCr 1.39, GFR 52 ml/min, hepatic panel normal,  CBC w/diff all normal.  HTN: no home bp monitoring. Gluc's: rare level <90, felt low, pepsi helps signif/quick.   Highest 280. EAG 140s. He avoids NSAIDs, tries to hydrate adequately in  the context of HF and CRI.  He takes 1 vicodin tab almost every night to help him sleep---the low back and bilat lower legs pain is sleep prohibitive. PMP AWARE reviewed today: most recent rx for vicodin 5/325 was filled 02/15/20, # 60, rx by me. No red flags.  ROS: no fevers, no CP, no SOB, no wheezing, no cough, no dizziness, no HAs, no new rashes (just chronic bilat LL rash), no melena/hematochezia.  No polyuria or polydipsia.  No myalgias or arthralgias.  No focal weakness, paresthesias, or tremors.  No acute vision or hearing abnormalities. No n/v/d or abd pain.  No palpitations.    Past Medical History:  Diagnosis Date  . ALLERGIC RHINITIS 08/11/2006  . ASTHMA 08/11/2006  . Chronic atrial fibrillation (HCC) 08/2014  . Chronic combined systolic and diastolic CHF (congestive heart failure) [facility])    Cardiology f/u 12/2017: pt volume overloaded (R heart dysf suspected), BNP very high, lasix increased.  Repeat echo 12/2017: normal LV EF, mild DD, +RV syst dysfxn, mod pulm HTN, biatrial enlgmt.  . Chronic constipation   . Chronic renal insufficiency, stage 2 (mild)    Borderline stage III (GFR 60s).  . DIABETES MELLITUS, TYPE II 08/11/2006  . DM W/RENAL MANIFESTATIONS, TYPE II 04/20/2007  . HYPERTENSION 08/11/2006  . Impaired mobility and endurance   . MRSA infection 06/2018   LL venous stasis ulcer infected  . Normocytic anemia 2016-2019   03/2018 B12 normal,  iron ok (ferritin borderline low).  Plan repeat CBC at f/u summer 2019 and if decreased from baseline will check hemoccults and iron labs again.  . OBESITY, MORBID 12/14/2007  . OSA (obstructive sleep apnea)    to get sleep study with [facility]) as of 12/03/2018 consult.  . 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  [id number] ID started him on amoxil prophylaxis---ineffective.  End 2018/Jan 2019 penicillin G injections prophyl helpful but pt declined to  continue this as of 01/2018 ID f/u.  ID talked him into resuming monthly penicillin G as of 02/2018 f/u.    Marland Kitchen Restless leg syndrome    Rx'd clonazepam 09/2017 and pt refused to take it after reading the medication's potential side effects.  . Venous stasis ulcers of both lower extremities (HCC)    Severe lymphedema.  wound clinic care ongoing as of 01/2018    Past Surgical History:  Procedure Laterality Date  . Carotid dopplers  07/23/2018   Left NORMAL.  Right 1-39% ICA stenosis, with <50% distal CCA stenosis (not hemodynamically significant)  . TRANSTHORACIC ECHOCARDIOGRAM  11/2007; 09/2014; 11/2015;12/2017   LV fxn normal, EF normal, mild dilation of left atrium.  2015 grade II diast dysfxn.  2017 EF 55-60%. 2019: LVEF 60-65%, mild RV syst dysf,biatrial enlgmt, mod pulm htn.  Marland Kitchen URETERAL STENT PLACEMENT    . virtual colonoscopy  01/2011   Normal    Outpatient Medications Prior to Visit  Medication Sig Dispense Refill  . arginine 500 MG tablet Take by mouth.    Marland Kitchen b complex vitamins capsule Take 1 capsule by mouth every morning.    . butalbital-acetaminophen-caffeine (FIORICET WITH CODEINE) 50-325-40-30 MG capsule TAKE 1 TO 2 CAPSULES BY  MOUTH EVERY 6 HOURS AS  NEEDED FOR HEADACHE(S)  MANUFACTURER RECOMMENDS NOT EXCEEDING 6 CAPSULES/DAY 60 capsule 0  . cetirizine (ZYRTEC) 10 MG tablet Take 10 mg by mouth daily.    . Coenzyme Q10 (CO Q 10) 10 MG CAPS Take by mouth. Reported on 02/19/2016    . diltiazem (TIAZAC) 240 MG 24 hr capsule TAKE 1 CAPSULE BY MOUTH  DAILY 90 capsule 0  . furosemide (LASIX) 40 MG tablet TAKE 2 TABLETS BY MOUTH TWO TIMES DAILY (Patient taking differently: Take 80 mg in the mornings and 40 mg in the evenings) 360 tablet 3  . Glutamine 500 MG CAPS Take by mouth.    . Insulin Lispro Prot & Lispro (HUMALOG MIX 75/25 KWIKPEN) (75-25) 100 UNIT/ML Kwikpen INJECT SUBCUTANEOUSLY 27 UNITS EVERY MORNING AND 22 UNITS EVERY EVENING AT  SUPPER. 45 mL 3  . Insulin Pen Needle (B-D  ULTRAFINE III SHORT PEN) 31G X 8 MM MISC USE 1 PEN NEEDLE TWICE DAILY 100 each 0  . Insulin Pen Needle (BD ULTRA-FINE PEN NEEDLES) 29G X 12.7MM MISC 1 each by Other route 2 (two) times daily. 200 each 2  . Insulin Pen Needle (BD ULTRA-FINE PEN NEEDLES) 29G X 12.7MM MISC USE TWICE DAILY 200 each 3  . lisinopril (ZESTRIL) 40 MG tablet TAKE 1 TABLET BY MOUTH  DAILY 90 tablet 1  . metFORMIN (GLUCOPHAGE) 1000 MG tablet TAKE 1 TABLET BY MOUTH  TWICE DAILY WITH MEALS 180 tablet 1  . metoprolol tartrate (LOPRESSOR) 50 MG tablet TAKE 1 AND 1/2 TABLETS BY  MOUTH TWICE DAILY 270 tablet 3  . Multiple Vitamin (MULTIVITAMIN) tablet Take 1 tablet by mouth daily.    Glory Rosebush ULTRA test strip CHECK  BLOOD SUGAR TWICE  DAILY 200 strip 3  . saw palmetto 500 MG capsule Take 500 mg by mouth daily.    Marland Kitchen terazosin (HYTRIN) 10 MG capsule TAKE 1 CAPSULE BY MOUTH  ONCE DAILY AT BEDTIME 90 capsule 3  . triamcinolone ointment (KENALOG) 0.1 % APPLY OINTMENT WITH EACH DRESSING CHANGE TO LEGS AS DIRECTED    . vitamin E 400 UNIT capsule Take 400 Units by mouth every morning. Selenium 50mg     . HYDROcodone-acetaminophen (NORCO/VICODIN) 5-325 MG tablet 1-2 tabs po bid prn pain 60 tablet 0  . XARELTO 20 MG TABS tablet TAKE 1 TABLET BY MOUTH ONCE DAILY WITH SUPPER 90 tablet 3   Facility-Administered Medications Prior to Visit  Medication Dose Route Frequency Provider Last Rate Last Admin  . penicillin g benzathine (BICILLIN LA) 1200000 UNIT/2ML injection 600,000 Units  600,000 Units Intramuscular Q30 days , MD   600,000 Units at 04/12/20 0933  . penicillin g benzathine (BICILLIN LA) 1200000 UNIT/2ML injection 600,000 Units  600,000 Units Intramuscular Q30 days 06/12/20, MD   600,000 Units at 04/12/20 0936  . penicillin g benzathine (BICILLIN LA) 1200000 UNIT/2ML injection 600,000 Units  600,000 Units Intramuscular Q30 days 06/12/20, MD   600,000 Units at 03/08/20 0934  . penicillin g benzathine  (BICILLIN LA) 1200000 UNIT/2ML injection 600,000 Units  600,000 Units Intramuscular Q30 days 05/08/20, MD   600,000 Units at 03/08/20 05/08/20    Allergies  Allergen Reactions  . Other Other (See Comments)    Sneezing, coughing    ROS As per HPI  PE: Vitals with BMI 04/12/2020 02/09/2020 01/05/2020  Height 5\' 9"  5\' 9"  5\' 10"   Weight 380 lbs 10 oz 382 lbs 10 oz 391 lbs 3 oz  BMI 56.18 56.47 56.13  Systolic 135 148 03/06/2020  Diastolic 66 70 72  Pulse 52 53 55    Gen: Alert, well appearing.  Patient is oriented to person, place, time, and situation. AFFECT: pleasant, lucid thought and speech. CV: RRR, no m/r/g.   LUNGS: CTA bilat, nonlabored resps, good aeration in all lung fields. EXT: no clubbing or cyanosis.  2-3+ pitting bilat LL edema. He has unna boots on bilat.   LABS:  Lab Results  Component Value Date   TSH 4.40 06/08/2018   Lab Results  Component Value Date   WBC 6.1 01/05/2020   HGB 12.6 (L) 01/05/2020   HCT 38.4 (L) 01/05/2020   MCV 85.6 01/05/2020   PLT 187.0 01/05/2020   Lab Results  Component Value Date   IRON 45 03/10/2018   FERRITIN 26.4 03/10/2018   Lab Results  Component Value Date   VITAMINB12 592 03/10/2018    Lab Results  Component Value Date   CREATININE 1.33 (H) 02/09/2020   BUN 20 02/09/2020   NA 141 02/09/2020   K 4.6 02/09/2020   CL 100 02/09/2020   CO2 24 02/09/2020   Lab Results  Component Value Date   ALT 10 07/06/2019   AST 17 07/06/2019   ALKPHOS 76 07/06/2019   BILITOT 1.0 07/06/2019   Lab Results  Component Value Date   CHOL 106 07/06/2019   Lab Results  Component Value Date   HDL 40.20 07/06/2019   Lab Results  Component Value Date   LDLCALC 55 07/06/2019   Lab Results  Component Value Date   TRIG 55.0 07/06/2019   Lab Results  Component Value Date   CHOLHDL 3 07/06/2019   Lab Results  Component Value Date   PSA 0.71 07/06/2019   PSA 0.26 03/09/2018   PSA 0.30 12/09/2016   Lab Results  Component  Value Date   HGBA1C 7.5 (H) 01/05/2020   IMPRESSION AND PLAN:  1) DM: fair control. No med changes at this time. Recent A1c stable at 7.3% at his wt loss clinic 8 days ago. Lytes/cr stable at that time.  2) HTN: The current medical regimen is effective;  continue present plan and medications. Lytes/cr stable 8 d/a.  3) CRI IIIa: stable on labs 04/04/20 via his wt loss clinic. Avoid NSAIDs, drink adequate clear fluids.  4) A-fib: regular rhythm today.  Continue xarelto, will try to help with med assistance programs as per his request today since he is in the medicare "donut hole".  I also recommended he call his insurer to see if eliquis is preferred. Recent CBC 04/04/20 normal.  5) LE venous insuff edema and lymphedema , with ongoing chronic bilat LL cellulitis: continue with monthly bicillin injections per ID rec's, also f/u with wound clinic regularly as usual.  Bicillin injection given here today.  6) Chronic pain syndrome: dong well, rare use of vicodin. CSC renewed today. Vicodin 5/325, #60 eRx'd today.  Spent 40 min with pt today, with >50% of this time spent in counseling and care coordination regarding the above problems.  An After Visit Summary was printed and given to the patient.  FOLLOW UP: Return in about 3 months (around 07/13/2020).  Signed:  Santiago Bumpers, MD           04/12/2020

## 2020-04-12 NOTE — Progress Notes (Signed)
EARLEY, GROBE ([id number]) Visit Report for 04/12/2020 Arrival Information Details Patient Name: Date of Service: [name], [name] 04/12/2020 10:45 A M Medical Record Number: [id number] Patient Account Number: [id number] Date of Birth/Sex: Treating RN: May 12, 1951 (69 y.o. [name], [name] Primary Care Provider: [name], [name] Other Clinician: Referring Provider: Treating Provider/Extender: [name] Weeks in Treatment: 41 Visit Information History Since Last Visit Added or deleted any medications: No Patient Arrived: Wheel Chair Any new allergies or adverse reactions: No Arrival Time: 11:15 Had a fall or experienced change in No Accompanied By: self activities of daily living that may affect Transfer Assistance: None risk of falls: Patient Identification Verified: Yes Signs or symptoms of abuse/neglect since last visito No Secondary Verification Process Completed: Yes Hospitalized since last visit: No Patient Requires Transmission-Based Precautions: No Implantable device outside of [facility] clinic excluding No Patient Has Alerts: Yes cellular tissue based products placed in [facility] center since last visit: Has Dressing in Place as Prescribed: Yes Has Compression in Place as Prescribed: Yes Pain Present Now: No Electronic Signature(s) Signed: 04/12/2020 5:57:14 PM By: [name] Entered By: [name] on 04/12/2020 11:19:36 -------------------------------------------------------------------------------- Clinic Level of Care Assessment Details Patient Name: Date of Service: [name], [name] 04/12/2020 10:45 A M Medical Record Number: [id number] Patient Account Number: [id number] Date of Birth/Sex: Treating RN: [birth year] (69 y.o. [name] Primary Care Provider: [name], [name] Other Clinician: Referring Provider: Treating Provider/Extender: [name] Weeks in Treatment: 220 Clinic Level of Care Assessment Items TOOL 4  Quantity Score [] - 0 Use when only an EandM is performed on FOLLOW-UP visit ASSESSMENTS - Nursing Assessment / Reassessment X- 1 10 Reassessment of Kevin-morbidities (includes updates in patient status) X- 1 5 Reassessment of Adherence to Treatment Plan ASSESSMENTS - Wound and Skin A ssessment / Reassessment [] - 0 Simple Wound Assessment / Reassessment - one wound X- 2 5 Complex Wound Assessment / Reassessment - multiple wounds [] - 0 Dermatologic / Skin Assessment (not related to wound area) ASSESSMENTS - Focused Assessment X- 2 5 Circumferential Edema Measurements - multi extremities [] - 0 Nutritional Assessment / Counseling / Intervention X- 1 5 Lower Extremity Assessment (monofilament, tuning fork, pulses) [] - 0 Peripheral Arterial Disease Assessment (using hand held doppler) ASSESSMENTS - Ostomy and/or Continence Assessment and Care [] - 0 Incontinence Assessment and Management [] - 0 Ostomy Care Assessment and Management (repouching, etc.) PROCESS - Coordination of Care X - Simple Patient / Family Education for ongoing care 1 15 [] - 0 Complex (extensive) Patient / Family Education for ongoing care X- 1 10 Staff obtains Programmer, systems, Records, T Results / Process Orders est [] - 0 Staff telephones HHA, Nursing Homes / Clarify orders / etc [] - 0 Routine Transfer to another Facility (non-emergent condition) [] - 0 Routine Hospital Admission (non-emergent condition) [] - 0 New Admissions / Biomedical engineer / Ordering NPWT Apligraf, etc. , [] - 0 Emergency Hospital Admission (emergent condition) X- 1 10 Simple Discharge Coordination [] - 0 Complex (extensive) Discharge Coordination PROCESS - Special Needs [] - 0 Pediatric / Minor Patient Management [] - 0 Isolation Patient Management [] - 0 Hearing / Language / Visual special needs [] - 0 Assessment of Community assistance (transportation, D/C planning, etc.) [] - 0 Additional assistance / Altered  mentation [] - 0 Support Surface(s) Assessment (bed, cushion, seat, etc.) INTERVENTIONS - Wound Cleansing / Measurement [] - 0 Simple Wound Cleansing -  one wound X- 2 5 Complex Wound Cleansing - multiple wounds X- 1 5 Wound Imaging (photographs - any number of wounds) [] - 0 Wound Tracing (instead of photographs) [] - 0 Simple Wound Measurement - one wound [] - 0 Complex Wound Measurement - multiple wounds INTERVENTIONS - Wound Dressings [] - 0 Small Wound Dressing one or multiple wounds [] - 0 Medium Wound Dressing one or multiple wounds [] - 0 Large Wound Dressing one or multiple wounds [] - 0 Application of Medications - topical [] - 0 Application of Medications - injection INTERVENTIONS - Miscellaneous [] - 0 External ear exam [] - 0 Specimen Collection (cultures, biopsies, blood, body fluids, etc.) [] - 0 Specimen(s) / Culture(s) sent or taken to Lab for analysis [] - 0 Patient Transfer (multiple staff / Civil [employer] fast streamer / Similar devices) [] - 0 Simple Staple / Suture removal (25 or less) [] - 0 Complex Staple / Suture removal (26 or more) [] - 0 Hypo / Hyperglycemic Management (close monitor of Blood Glucose) [] - 0 Ankle / Brachial Index (ABI) - do not check if billed separately X- 1 5 Vital Signs Has Kevin patient been seen at Kevin hospital within Kevin last three years: Yes Total Score: 95 Level Of Care: New/Established - Level 3 Electronic Signature(s) Signed: 04/12/2020 6:08:32 PM By: [name] Entered By: Kevin Gouty on 04/12/2020 11:28:08 -------------------------------------------------------------------------------- Encounter Discharge Information Details Patient Name: Date of Service: [name], [name] 04/12/2020 10:45 A M Medical Record Number: [id number] Patient Account Number: [id number] Date of Birth/Sex: Treating RN: Jun 09, 1951 (69 y.o. [name] Primary Care Provider: [name], [name] Other Clinician: Referring  Provider: Treating Provider/Extender: [name] Weeks in Treatment: 220 Encounter Discharge Information Items Discharge Condition: Stable Ambulatory Status: Wheelchair Discharge Destination: Home Transportation: Private Auto Accompanied By: self Schedule Follow-up Appointment: Yes Clinical Summary of Care: Notes lotion and compression stockings applied to BLE. Electronic Signature(s) Signed: 04/12/2020 5:57:14 PM By: [name] Entered By: [name] on 04/12/2020 11:42:09 -------------------------------------------------------------------------------- Lower Extremity Assessment Details Patient Name: Date of Service: [name], [name] 04/12/2020 10:45 A M Medical Record Number: [id number] Patient Account Number: [id number] Date of Birth/Sex: Treating RN: May 31, 1951 (69 y.o. [name] Primary Care Provider: [name], [name] Other Clinician: Referring Provider: Treating Provider/Extender: [name] Weeks in Treatment: 220 Edema Assessment Assessed: [Left: Yes] [Right: Yes] Edema: [Left: Yes] [Right: Yes] Calf Left: Right: Point of Measurement: 25 cm From Medial Instep 40 cm 41 cm Ankle Left: Right: Point of Measurement: 9 cm From Medial Instep 28 cm 28.5 cm Vascular Assessment Pulses: Dorsalis Pedis Palpable: [Left:Yes] [Right:Yes] Electronic Signature(s) Signed: 04/12/2020 5:57:14 PM By: [name] Entered By: [name] on 04/12/2020 11:20:26 -------------------------------------------------------------------------------- Cherry Details Patient Name: Date of Service: [name], [name] 04/12/2020 10:45 A M Medical Record Number: [id number] Patient Account Number: [id number] Date of Birth/Sex: Treating RN: 1951/04/29 (69 y.o. [name] Primary Care Provider: [name], [name] Other Clinician: Referring Provider: Treating Provider/Extender: [name] Weeks in  Treatment: 220 Active Inactive Venous Leg Ulcer Nursing Diagnoses: Actual venous Insuffiency (use after diagnosis is confirmed) Goals: Patient will maintain optimal edema control Date Initiated: 09/10/2016 Target Resolution Date: 05/03/2020 Goal Status: Active Verify adequate tissue perfusion prior to therapeutic compression application Date Initiated: 09/10/2016 Date Inactivated: 11/28/2016 Goal Status: Met Interventions: Assess peripheral edema status every visit. Compression as ordered Provide education on venous  insufficiency Notes: edema not contolled above wraps, pt not using lymoh pumps regularly Electronic Signature(s) Signed: 04/12/2020 6:08:32 PM By: [name] Entered By: Kevin Gouty on 04/12/2020 11:26:37 -------------------------------------------------------------------------------- Pain Assessment Details Patient Name: Date of Service: [name], [name] 04/12/2020 10:45 A M Medical Record Number: [id number] Patient Account Number: [id number] Date of Birth/Sex: Treating RN: 29-Nov-1950 (69 y.o. [name] Primary Care Michel Eskelson: [name] Other Clinician: Referring Cidney Kirkwood: Treating Ardice Boyan/Extender: [name] Weeks in Treatment: 220 Active Problems Location of Pain Severity and Description of Pain Patient Has Paino No Site Locations Rate Kevin pain. Current Pain Level: 0 Pain Management and Medication Current Pain Management: Medication: No Cold Application: No Rest: No Massage: No Activity: No T.E.N.S.: No Heat Application: No Leg drop or elevation: No Is Kevin Current Pain Management Adequate: Adequate How does your wound impact your activities of daily livingo Sleep: No Bathing: No Appetite: No Relationship With Others: No Bladder Continence: No Emotions: No Bowel Continence: No Work: No Toileting: No Drive: No Dressing: No Hobbies: No Electronic Signature(s) Signed: 04/12/2020 5:57:14 PM By:  [name] Entered By: [name] on 04/12/2020 11:20:06 -------------------------------------------------------------------------------- Patient/Caregiver Education Details Patient Name: Date of Service: [name], [name] 6/9/2021andnbsp10:45 A M Medical Record Number: [id number] Patient Account Number: [id number] Date of Birth/Gender: Treating RN: 1951-09-08 (69 y.o. [name] Primary Care Physician: [name] Other Clinician: Referring Physician: Treating Physician/Extender: Criss Rosales, [name] Weeks in Treatment: 42 Education Assessment Education Provided To: Patient Education Topics Provided Venous: Methods: Explain/Verbal Responses: Reinforcements needed, State content correctly Wound/Skin Impairment: Methods: Explain/Verbal Responses: Reinforcements needed, State content correctly Electronic Signature(s) Signed: 04/12/2020 6:08:32 PM By: [name] Entered By: Kevin Gouty on 04/12/2020 11:27:17 -------------------------------------------------------------------------------- Wound Assessment Details Patient Name: Date of Service: [name], [name] 04/12/2020 10:45 A M Medical Record Number: [id number] Patient Account Number: [id number] Date of Birth/Sex: Treating RN: Mar 20, 1951 (69 y.o. [name], [name] Primary Care Kayne Yuhas: [name] Other Clinician: Referring Gavin Faivre: Treating Mayar Whittier/Extender: [name] Weeks in Treatment: 220 Wound Status Wound Number: [id number] Primary Etiology: Venous Leg Ulcer Wound Location: Left, Dorsal Foot Wound Status: Healed - Epithelialized Wounding Event: Gradually Appeared Date Acquired: 03/29/2020 Weeks Of Treatment: 2 Clustered Wound: No Wound Measurements Length: (cm) Width: (cm) Depth: (cm) Area: (cm) Volume: (cm) 0 % Reduction in Area: 100% 0 % Reduction in Volume: 100% 0 0 0 Wound Description Classification: Full Thickness Without  Exposed Support Structur es Electronic Signature(s) Signed: 04/12/2020 5:57:14 PM By: [name] Entered By: [name] on 04/12/2020 11:20:42 -------------------------------------------------------------------------------- Wound Assessment Details Patient Name: Date of Service: [name], [name] 04/12/2020 10:45 A M Medical Record Number: [id number] Patient Account Number: [id number] Date of Birth/Sex: Treating RN: [birth year] (69 y.o. 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 Wound Location: Left, Distal, Dorsal Foot Wound Status: Healed - Epithelialized Wounding Event: Gradually Appeared Date Acquired: 03/29/2020 Weeks Of Treatment: 2 Clustered Wound: No Wound Measurements Length: (cm) Width: (cm) Depth: (cm) Area: (cm) Volume: (cm) 0 % Reduction in Area: 100% 0 % Reduction in Volume: 100% 0 0 0 Wound Description Classification: Grade 2 Electronic Signature(s) Signed: 04/12/2020 5:57:14 PM By: [name] Entered By: [name] on 04/12/2020 11:20:43 -------------------------------------------------------------------------------- Vitals Details Patient Name:  Date of Service: [name], [name] 04/12/2020 10:45 A M Medical Record Number: [id number] Patient Account Number: [id number] Date of Birth/Sex: Treating RN: Aug 02, 1951 (69 y.o. [name], [name] Primary Care Provider: [name], [name] Other Clinician: Referring Provider: Treating Provider/Extender: [name] Weeks in Treatment: 220 Vital Signs Time Taken: 11:16 Temperature (F): 97.7 Height (in): 70 Pulse (bpm): 74 Weight (lbs): 380.2 Respiratory Rate (breaths/min): 18 Body Mass Index (BMI): 54.5 Blood Pressure (mmHg): 157/62 Reference Range: 80  - 120 mg / dl Electronic Signature(s) Signed: 04/12/2020 5:57:14 PM By: [name] Entered By: [name] on 04/12/2020 11:19:53

## 2020-04-12 NOTE — Progress Notes (Addendum)
[name], [name] ([id number]) Visit Report for 04/12/2020 Chief Complaint Document Details Patient Name: Date of Service: [name] 04/12/2020 10:45 A M Medical Record Number: [id number] Patient Account Number: [id number] Date of Birth/Sex: Treating RN: 22-Sep-1951 (69 y.o. [name] Primary Care Provider: Marney Setting, [name] Other Clinician: Referring Provider: Treating Provider/Extender: Lenda Kelp [facility] [name], [name] in Treatment: 220 Information Obtained from: Patient Chief Complaint patient is here for evaluation venous/lymphedema weeping Electronic Signature(s) Signed: 04/12/2020 10:49:17 AM By: Lenda Kelp PA-C Entered By: Lenda Kelp on 04/12/2020 10:49:17 -------------------------------------------------------------------------------- HPI Details Patient Name: Date of Service: Kevin [name], [name] 04/12/2020 10:45 A M Medical Record Number: [id number] Patient Account Number: [id number] Date of Birth/Sex: Treating RN: August 19, 1951 (69 y.o. [name] Primary Care Provider: Marney Setting, [name] Other Clinician: Referring Provider: Treating Provider/Extender: Lenda Kelp [facility] [name], [name] in Treatment: 220 History of Present Illness HPI Description: Referred by PCP for consultation. Patient has long standing history of BLE venous stasis, no prior ulcerations. At beginning of month, developed cellulitis and weeping. Received IM Rocephin followed by Keflex and resolved. Wears compression stocking, appr 6 months old. Not sure strength. No present drainage. 01/22/16 this is a patient who is a type II diabetic on insulin. 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 He tells me he religiously wears pressure stockings of uncertain strength. He was here with weeping edema about 8 months ago but did not have an open wound. Roughly a month ago he had a reopening on his bilateral legs. He is been using bandages and Neosporin. He  does not complain of pain. 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 Last BUN/creatinine I have is from 11/20/15 at 13 and 1.0 respectively 01/29/16; patient arrives today having tolerated the Profore wrap. He brought in his stockings and these are 18 mmHg stockings he bought from [place]. The compression here is likely inadequate. He does not complain of pain or excessive drainage she has no systemic symptoms. The wound on the right looks improved as does the one on the left although one on the left is more substantial with still tissue at risk below the actual wound area on the bilateral posterior calf 02/05/16; patient arrives with poor edema control. He states that we did put a 4 layer compression on it last week. No weight appear 5 this. 02/12/16; the area on the posterior right Has healed. The left Has a substantial wound that has necrotic surface eschar that requires a debridement with a curette. 02/16/16;the patient called or a Nurse visit secondary to increased swelling. He had been in earlier in the week with his right leg healed. He was transitioned to is on pressure stocking on the right leg with the only open wound on the left, a substantial area on the left posterior calf. Note he has a history of severe lower extremity edema, he has a history of chronic atrial fibrillation but not heart failure per my notes but I'll need to research this. He is not complaining of chest pain shortness of breath or orthopnea. The intake nurse noted blisters on the previously closed right leg 02/19/16; this is the patient's regular visit day. I see him on Friday with escalating edema new wounds on the right leg and clear signs of at least right ventricular heart failure. I increased his Lasix to  40 twice a day. He is returning currently in follow-up. States he is noticed a decrease in that the  edema 02/26/16 patient's legs have much less edema. There is nothing really open on the right leg. The left leg has improved condition of the large superficial wound on the posterior left leg 03/04/16; edema control is very much better. The patient's right leg wounds have healed. On the left leg he continues to have severe venous inflammation on the posterior aspect of the left leg. There is no tenderness and I don't think any of this is cellulitis. 03/11/16; patient's right leg is married healed and he is in his own stocking. The patient's left leg has deteriorated somewhat. There is a lot of erythema around the wound on the posterior left leg. There is also a significant rim of erythema posteriorly just above where the wrap would've ended there is a new wound in this location and a lot of tenderness. Can't rule out cellulitis in this area. 03/15/16; patient's right leg remains healed and he is in his own stocking. The patient's left leg is much better than last review. His major wound on the posterior aspect of his left Is almost fully epithelialized. He has 3 small injuries from the wraps. Really. Erythema seems a lot better on antibiotics 03/18/16; right leg remains healed and he is in his own stocking. The patient's left leg is much better. The area on the posterior aspect of the left calf is fully epithelialized. His 3 small injuries which were wrap injuries on the left are improved only one seems still open his erythema has resolved 03/25/16; patient's right leg remains healed and he is in his own stocking. There is no open area today on the left leg posterior leg is completely closed up. His wrap injuries at the superior aspect of his leg are also resolved. He looks as though he has some irritation on the dorsal ankle but this is fully epithelialized without evidence of infection. 03/28/16; we discharged this patient on Monday. Transitioned him into his own stocking. There were problems almost  immediately with uncontrolled swelling weeping edema multiple some of which have opened. He does not feel systemically unwell in particular no chest pain no shortness of breath and he does not feel 04/08/16; the edema is under better control with the Profore light wrap but he still has pitting edema. There is one large wound anteriorly 2 on the medial aspect of his left leg and 3 small areas on the superior posterior calf. Drainage is not excessive he is tolerating a Profore light well 04/15/16; put a Profore wrap on him last week. This is controlled is edema however he had a lot of pain on his left anterior foot most of his wounds are healed 04/22/16 once again the patient has denuded areas on the left anterior foot which he states are because his wrap slips up word. He saw his primary physician today is on Lasix 40 twice a day and states that he his weight is down 20 pounds over the last 3 months. 04/29/16: Much improved. left anterior foot much improved. He is now on Lasix 80 mg per day. Much improved edema control 05/06/16; I was hoping to be able to discharge him today however once again he has blisters at a low level of where the compression was placed last week mostly on his left lateral but also his left medial leg and a small area on the anterior part of the left foot. 05/09/16;  apparently the patient went home after his appointment on 7/4 later in the evening developing pain in his upper medial thigh together with subjective fever and chills although his temperature was not taken. The pain was so intense he felt he would probably have to call 911. However he then remembered that he had leftover doxycycline from a previous round of antibiotics and took these. By the next morning he felt a lot better. He called and spoke to one of our nurses and I approved doxycycline over the phone thinking that this was in relation to the wounds we had previously seen although they were definitely were not. The  patient feels a lot better old fever no chills he is still working. Blood sugars are reasonably controlled 05/13/16; patient is back in for review of his cellulitis on his anterior medial upper thigh. He is taking doxycycline this is a lot better. Culture I did of the nodular area on the dorsal aspect of his foot grew MRSA this also looks a lot better. 05/20/16; the patient is cellulitis on the medial upper thigh has resolved. All of his wound areas including the left anterior foot, areas on the medial aspect of the left calf and the lateral aspect of the calf at all resolved. He has a new blister on the left dorsal foot at the level of the fourth toe this was excised. No evidence of infection 05/27/16; patient continues to complain weeping edema. He has new blisterlike wounds on the left anterior lateral and posterior lateral calf at the top of his wrap levels. The area on his left anterior foot appears better. He is not complaining of fever, pain or pruritus in his feet. 05/30/16; the patient's blisters on his left anterior leg posterior calf all look improved. He did not increase the Lasix 100 mg as I suggested because he was going to run out of his 40 mg tablets. He is still having weeping edema of his toes 06/03/16; I renewed his Lasix at 80 mg once a day as he was about to run out when I last saw him. He is on 80 mg of Lasix now. I have asked him to cut down on the excessive amount of water he was drinking and asked him to drink according to his thirst mechanisms 06/12/2016 -- was seen 2 days ago and was supposed to wear his compression stockings at home but he is developed lymphedema and superficial blisters on the left lower extremity and hence came in for a review 06/24/16; the remaining wound is on his left anterior leg. He still has edema coming from between his toes. There is lymphedema here however his edema is generally better than when I last saw this. He has a history of atrial fibrillation  but does not have a known history of congestive heart failure nevertheless I think he probably has this at least on a diastolic basis. 07/01/16 I reviewed his echocardiogram from January 2017. This was essentially normal. He did not have LVH, EF of 55-60%. His right ventricular function was normal although he did have trivial tricuspid and pulmonic regurgitation. This is not audible on exam however. I increased his Lasix to do massive edema in his legs well above his knees I think in early July. He was also drinking an excessive amount of water at the time. 07/15/16; missed his appointment last week because of the Labor Day holiday on Monday. He could not get another appointment later in the week. Started to feel the wrap  digging in superiorly so we remove the top half and the bottom half of his wrap. He has extensive erythema and blistering superiorly in the left leg. Very tender. Very swollen. Edema in his foot with leaking edema fluid. He has not been systemically unwell 07/22/16; the area on the left leg laterally required some debridement. The medial wounds look more stable. His wrap injury wounds appear to have healed. Edema and his foot is better, weeping edema is also better. He tells me he is meeting with the supplier of the external compression pumps at work 08/05/16; the patient was on vacation last week in [facility]. His wrap is been on for an extended period of time. Also over the weekend he developed an extensive area of tender erythema across his anterior medial thigh. He took to doxycycline yesterday that he had leftover from a previous prescription. The patient complains of weeping edema coming out of his toes 08/08/16; I saw this patient on 10/2. He was tender across his anterior thigh. I put him on doxycycline. He returns today in follow-up. He does not have any open wounds on his lower leg, he still has edema weeping into his toes. 08/12/16; patient was seen back urgently today to  follow-up for his extensive left thigh cellulitis/erysipelas. He comes back with a lot less swelling and erythema pain is much better. I believe I gave him Augmentin and Cipro. His wrap was cut down as he stated a roll down his legs. He developed blistering above the level of the wrap that remained. He has 2 open blisters and 1 intact. 08/19/16; patient is been doing his primary doctor who is increased his Lasix from 40-80 once a day or 80 already has less edema. Cellulitis has remained improved in the left thigh. 2 open areas on the posterior left calf 08/26/16; he returns today having new open blisters on the anterior part of his left leg. He has his compression pumps but is not yet been shown how to use some vital representative from the supplier. 09/02/16 patient returns today with no open wounds on the left leg. Some maceration in his plantar toes 09/10/2016 -- Dr. Leanord Hawking had recently discharged him on 09/02/2016 and he has come right back with redness swelling and some open ulcers on his left lower extremity. He says this was caused by trying to apply his compression stockings and he's been unable to use this and has not been able to use his lymphedema pumps. He had some doxycycline leftover and he has started on this a few days ago. 09/16/16; there are no open wounds on his leg on the left and no evidence of cellulitis. 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 He does not complain of symptoms here. I am not clear use using his external compression pumps. 09/23/16; I have not seen this patient in 2 weeks. He canceled his appointment 10 days ago as he was going on vacation. He tells me that on Monday he noticed a large area on his posterior left leg which is been draining copiously and is reopened into a large wound. He is been using ABDs and the external part of his juxtalite, according to our nurse this was not on properly. 10/07/16;  Still a substantial area on the posterior left leg. Using silver alginate 10/14/16; in general better although there is still open area which looks healthy. Still using silver alginate. He reminds me that this happen before he  left for [facility]. T oday while he was showering in the morning. He had been using his juxtalite's 10/21/16; the area on his posterior left leg is fully epithelialized. However he arrives today with a large area of tender erythema in his medial and posterior left thigh just above the knee. I have marked the area. Once again he is reluctant to consider hospitalization. I treated him with oral antibiotics in the past for a similar situation with resolution I think with doxycycline however this area it seems more extensive to me. He is not complaining of fever but does have chills and says states he is thirsty. His blood sugar today was in the 140s at home 10/25/16 the area on his posterior left leg is fully epithelialized although there is still some weeping edema. The large area of tenderness and erythema in his medial and posterior left thigh is a lot less tender although there is still a lot of swelling in this thigh. He states he feels a lot better. He is on doxycycline and Augmentin that I started last week. This will continued until Tuesday, December 26. I have ordered a duplex ultrasound of the left thigh rule out DVT whether there is an abscess something that would need to be drained I would also like to know. 11/01/16; he still has weeping edema from a not fully epithelialized area on his left posterior calf. Most of the rest of this looks a lot better. He has completed his antibiotics. His thigh is a lot better. Duplex ultrasound did not show a DVT in the thigh 11/08/16; he comes in today with more Denuded surface epithelium from the posterior aspect of his calf. There is no real evidence of cellulitis. The superior aspect of his wrap appears to have put quite an  indentation in his leg just below the knee and this may have contributed. He does not complain of pain or fever. We have been using silver alginate as the primary dressing. The area of cellulitis in the right thigh has totally resolved. He has been using his compression stockings once a week 11/15/16; the patient arrives today with more loss of epithelium from the posterior aspect of his left calf. He now has a fairly substantial wound in this area. The reason behind this deterioration isn't exactly clear although his edema is not well controlled. He states he feels he is generally more swollen systemically. He is not complaining of chest pain shortness of breath fever. T me he has an appointment with his primary physician in early February. He is on 80 mg of oral ells Lasix a day. He claims compliance with the external compression pumps. He is not having any pain in his legs similar to what he has with his recurrent cellulitis 11/22/16; the patient arrives a follow-up of his large area on his left lateral calf. This looks somewhat better today. He came in earlier in the week for a dressing change since I saw him a week ago. He is not complaining of any pain no shortness of breath no chest pain 11/28/16; the patient arrives for follow-up of his large area on the left lateral calf this does not look better. In fact it is larger weeping edema. The surface of the wound does not look too bad. We have been using silver alginate although I'm not certain that this is a dressing issue. 12/05/16; again the patient follows up for a large wound on the left lateral and left posterior calf this does not  look better. There continues to be weeping edema necrotic surface tissue. More worrisome than this once again there is erythema below the wound involving the distal Achilles and heel suggestive of cellulitis. He is on his feet working most of the day of this is not going well. We are changing his dressing twice a week to  facilitate the drainage. 12/12/16; not much change in the overall dimensions of the large area on the left posterior calf. This is very inflamed looking. I gave him an. Doxycycline last week does not really seem to have helped. He found the wrap very painful indeed it seems to of dog into his legs superiorly and perhaps around the heel. He came in early today because the drainage had soaked through his dressings. 12/19/16- patient arrives for follow-up evaluation of his left lower extremity ulcers. He states that he is using his lymphedema pumps once daily when there is "no drainage". He admits to not using his lipedema pumps while under current treatment. His blood sugars have been consistently between 150-200. 12/26/16; the patient is not using his compression pumps at home because of the wetness on his feet. I've advised him that I think it's important for him to use this daily. He finds his feet too wet, he can put a plastic bag over his legs while he is in the pumps. Otherwise I think will be in a vicious circle. We are using silver alginate to the major area on his left posterior calf 01/02/17; the patient's posterior left leg has further of all into 3 open wounds. All of them covered with a necrotic surface. He claims to be using his compression pumps once a day. His edema control is marginal. Continue with silver alginate 01/10/17; the patient's left posterior leg actually looks somewhat better. There is less edema, less erythema. Still has 3 open areas covered with a necrotic surface requiring debridement. He claims to be using his compression pumps once a day his edema control is better 01/17/17; the patient's left posterior calf look better last week when I saw him and his wrap was changed 2 days ago. He has noted increasing pain in the left heel and arrives today with much larger wounds extensive erythema extending down into the entire heel area especially tender medially. He is not  systemically unwell CBGs have been controlled no fever. Our intake nurse showed me limegreen drainage on his AVD pads. 01/24/17; his usual this patient responds nicely to antibiotics last week giving him Levaquin for presumed Pseudomonas. The whole entire posterior part of his leg is much better much less inflamed and in the case of his Achilles heel area much less tender. He has also had some epithelialization posteriorly there are still open areas here and still draining but overall considerably better 01/31/17- He has continue to tolerate the compression wraps. he states that he continues to use the lymphedema pumps daily, and can increase to twice daily on the weekends. He is voicing no complaints or concerns regarding his LLE ulcers 02/07/17-he is here for follow-up evaluation. He states that he noted some erythema to the left medial and anterior thigh, which he states is new as of yesterday. He is concerned about recurrent cellulitis. He states his blood sugars have been slightly elevated, this morning in the 180s 02/14/17; he is here for follow-up evaluation. When he was last here there was erythema superiorly from his posterior wound in his anterior thigh. He was prescribed Levaquin however a culture of the wound surface  grew MRSA over the phone I changed him to doxycycline on Monday and things seem to be a lot better. 02/24/17; patient missed his appointment on Friday therefore we changed his nurse visit into a physician visit today. Still using silver alginate on the large area of the posterior left thigh. He isn't new area on the dorsal left second toe 03/03/17; actually better today although he admits he has not used his external compression pumps in the last 2 days or so because of work responsibilities over the weekend. 03/10/17; continued improvement. External compression pumps once a day almost all of his wounds have closed on the posterior left calf. Better edema control 03/17/17; in general  improved. He still has 3 small open areas on the lateral aspect of his left leg however most of the area on the posterior part of his leg is epithelialized. He has better edema control. He has an ABD pad under his stocking on the right anterior lower leg although he did not let us look at that today. 03/24/17; patient arrives back in clinic today with no open areas however there are areas on the posterior left calf and anterior left calf that are less than 100% epithelialized. His edema is well controlled in the left lower leg. There is some pitting edema probably lymphedema in the left upper thigh. He uses compression pumps at home once per day. I tried to get him to do this twice a day although he is very reticent. 04/01/2017 -- for the last 2 days he's had significant redness, tenderness and weeping and came in for an urgent visit today. 04/07/17; patient still has 6 more days of doxycycline. He was seen by Dr. Meyer Russel last Wednesday for cellulitis involving the posterior aspect, lateral aspect of his Involving his heel. For the most part he is better there is less erythema and less weeping. He has been on his feet for 12 hours 2 over the weekend. Using his compression pumps once a day 04/14/17 arrives today with continued improvement. Only one area on the posterior left calf that is not fully epithelialized. He has intense bilateral venous inflammation associated with his chronic venous insufficiency disease and secondary lymphedema. We have been using silver alginate to the left posterior calf wound In passing he tells [place] today that the right leg but we have not seen in quite some time has an open area on it but he doesn't want [place] to look at this today states he will show this to [place] next week. 04/21/17; there is no open area on his left leg although he still reports some weeping edema. He showed [place] his right leg today which is the first time we've seen this leg in a long time. He has a large area of  open wound on the right leg anteriorly healthy granulation. Quite a bit of swelling in the right leg and some degree of venous inflammation. He told us about the right leg in passing last week but states that deterioration in the right leg really only happened over the weekend 04/28/17; there is no open area on the left leg although there is an irritated part on the posterior which is like a wrap injury. The wound on the right leg which was new from last week at least to [place] is a lot better. 05/05/17; still no open area on the left leg. Patient is using his new compression stocking which seems to be doing a good job of controlling the edema. He states he  is using his compression pumps once per day. The right leg still has an open wound although it is better in terms of surface area. Required debridement. A lot of pain in the posterior right Achilles marked tenderness. Usually this type of presentation this patient gives concern for an active cellulitis 05/12/17; patient arrives today with his major wound from last week on the right lateral leg somewhat better. Still requiring debridement. He was using his compression stocking on the left leg however that is reopened with superficial wounds anteriorly he did not have an open wound on this leg previously. He is still using his juxta light's once daily at night. He cannot find the time to do this in the morning as he has to be at work by 7 AM 05/19/17; right lateral leg wound looks improved. No debridement required. The concerning area is on the left posterior leg which appears to almost have a subcutaneous hemorrhagic component to it. We've been using silver alginate to all the wounds 05/26/17; the right lateral leg wound continues to look improved. However the area on the left posterior calf is a tightly adherent surface. [place] using silver alginate. Because of the weeping edema in his legs there is very little good alternatives. 06/02/17; the patient left  here last week looking quite good. Major wound on the left posterior calf and a small one on the right lateral calf. Both of these look satisfactory. He tells me that by Wednesday he had noted increased pain in the left leg and drainage. He called on Thursday and Friday to get an appointment here but we were blocked. He did not go to urgent care or his primary physician. He thinks he had a fever on Thursday but did not actually take his temperature. He has not been using his compression pumps on the left leg because of pain. I advised him to go to the emergency room today for IV antibiotics for stents of left leg cellulitis but he has refused I have asked him to take 2 days off work to keep his leg elevated and he has refused this as well. In view of this I'm going to call him and Augmentin and doxycycline. 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 However after a detailed discussion regarding the risk benefits and alternatives he refuses to get admitted to the hospital. With no other recourse I will continue him on oral antibiotics as before and hopefully he'll have his infectious disease consultation this week. 06/16/2017 -- the patient was seen today by the nurse practitioner at infectious disease Ms. Dixon. 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 He has also been advise Lotrimin cream locally. she also had wise IV antibiotics for his condition if it flares up 06/23/17; patient arrives today with drainage bilaterally although the remaining wound on the left posterior calf after  cleaning up today "highlighter yellow drainage" did not look too bad. Unfortunately he has had breakdown on the right anterior leg [previously this leg had not been open and he is using a black stocking] he went  to see infectious disease and is been put on clindamycin 150 mg daily, I did not verify the dose although I'm not familiar with using clindamycin in this dosing range, perhaps for prophylaxisoo 06/27/17; I brought this patient back today to follow-up on the wound deterioration on the right lower leg together with surrounding cellulitis. I started him on doxycycline 4 days ago. This area looks better however he comes in today with intense cellulitis on the medial part of his left thigh. This is not have a wound in this area. Extremely tender. We've been using silver alginate to the wounds on the right lower leg left lower leg with bilateral 4 layer compression he is using his external compression pumps once a day 07/04/17; patient's left medial thigh cellulitis looks better. He has not been using his compression pumps as his insert said it was contraindicated with cellulitis. His right leg continues to make improvements all the wounds are still open. We only have one remaining wound on the left posterior calf. Using silver alginate to all open areas. He is on doxycycline which I started a week ago and should be finishing I gave him Augmentin after Thursday's visit for the severe cellulitis on the left medial thigh which fortunately looks better 07/14/17; the patient's left medial thigh cellulitis has resolved. The cellulitis in his right lower calf on the right also looks better. All of his wounds are stable to improved we've been using silver alginate he has completed the antibiotics I have given him. He has clindamycin 150 mg once a day prescribed by infectious disease for prophylaxis, I've advised him to start this now. We have been using bilateral Unna boots over silver alginate to the wound  areas 07/21/17; the patient is been to see infectious disease who noted his recurrent problems with cellulitis. He was not able to tolerate prophylactic clindamycin therefore he is on amoxicillin 500 twice a day. He also had a second daily dose of Lasix added By Dr. Oneta Rack but he is not taking this. Nor is he being completely compliant with his compression pumps a especially not this week. He has 2 remaining wounds one on the right posterior lateral lower leg and one on the left posterior medial lower leg. 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 The patient has Unna boots bilaterally. 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 He has bilateral Unna boots for compression and silver alginate to his wounds. Arrives today with his legs looking as good as I have seen him in quite some time. Not surprisingly his wounds look better as well with improvement on the right lateral leg venous insufficiency wound and also the left medial leg. He is still using the compression pumps once a day 08/11/17; both legs appear to be doing better wounds on the right lateral and left medial legs look better. Skin on the right leg quite good. He is been using silver alginate as the primary dressing. I'm going to use Anasept gel calcium alginate and maintain all the secondary dressings 08/18/17; the patient continues to actually do quite well. The area on his right lateral leg is just about closed the left medial also looks better although it is still moist in this area. His edema is well controlled we have been using Anasept gel  with calcium alginate and the usual secondary dressings, 4 layer compression and once daily use of his compression pumps "always been able to manage 09/01/17; the patient  continues to do reasonably well in spite of his trip to [place]. The area on the right lateral leg is epithelialized. Left is much better but still open. He has more edema and more chronic erythema on the left leg [venous inflammation] 09/08/17; he arrives today with no open wound on the right lateral leg and decently controlled edema. Unfortunately his left leg is not nearly as in his good situation as last week.he apparently had increasing edema starting on Saturday. He edema soaked through into his foot so used a plastic bag to walk around his home. The area on the medial right leg which was his open area is about the same however he has lost surface epithelium on the left lateral which is new and he has significant pain in the Achilles area of the left foot. He is already on amoxicillin chronically for prophylaxis of cellulitis in the left leg 09/15/17; he is completed a week of doxycycline and the cellulitis in the left posterior leg and Achilles area is as usual improved. He still has a lot of edema and fluid soaking through his dressings. There is no open wound on the right leg. He saw infectious disease NP today 09/22/17;As usual 1 we transition him from our compression wraps to his stockings things did not go well. He has several small open areas on the right leg. He states this was caused by the compression wrap on his skin although he did not wear this with the stockings over them. He has several superficial areas on the left leg medially laterally posteriorly. He does not have any evidence of active cellulitis especially involving the left Achilles The patient is traveling from [facility] Saturday going to [facility]. He states he isn't attempting to get an appointment with a heel objects wound center there to change his dressings. I am not completely certain whether this will work 10/06/17; the patient came in on Friday for a nurse visit and the nurse reported that his legs actually look  quite good. He arrives in clinic today for his regular follow-up visit. He has a new wound on his left third toe over the PIP probably caused by friction with his footwear. He has small areas on the left leg and a very superficial but epithelialized area on the right anterior lateral lower leg. Other than that his legs look as good as I've seen him in quite some time. We have been using silver alginate Review of systems; no chest pain no shortness of breath other than this a 10 point review of systems negative 10/20/17; seen by Dr. Meyer Russel last week. He had taken some antibiotics [doxycycline] that he had left over. Dr. Meyer Russel thought he had candida infection and declined to give him further antibiotics. He has a small wound remaining on the right lateral leg several areas on the left leg including a larger area on the left posterior several left medial and anterior and a small wound on the left lateral. The area on the left dorsal third toe looks a lot better. ROS; Gen.; no fever, respiratory no cough no sputum Cardiac no chest pain other than this 10 point review of system is negative 10/30/17; patient arrives today having fallen in the bathtub 3 days ago. It took him a while to get up. He has pain and maceration in the  wounds on his left leg which have deteriorated. He has not been using his pumps he also has some maceration on the right lateral leg. 11/03/17; patient continues to have weeping edema especially in the left leg. This saturates his dressings which were just put on on 12/27. As usual the doxycycline seems to take care of the cellulitis on his lower leg. He is not complaining of fever, chills, or other systemic symptoms. He states his leg feels a lot better on the doxycycline I gave him empirically. He also apparently gets injections at his primary doctor's officeo Rocephin for cellulitis prophylaxis. I didn't ask him about his compression pump compliance today I think that's probably  marginal. Arrives in the clinic with all of his dressings primary and secondary macerated full of fluid and he has bilateral edema 11/10/17; the patient's right leg looks some better although there is still a cluster of wounds on the right lateral. The left leg is inflamed with almost circumferential skin loss medially to laterally although we are still maintaining anteriorly. He does not have overt cellulitis there is a lot of drainage. He is not using compression pumps. We have been using silver alginate to the wound areas, there are not a lot of options here 11/17/17; the patient's right leg continues to be stable although there is still open wounds, better than last week. The inflammation in the left leg is better. Still loss of surface layer epithelium especially posteriorly. There is no overt cellulitis in the amount of edema and his left leg is really quite good, tells me he is using his compression pumps once a day. 11/24/17; patient's right leg has a small superficial wound laterally this continues to improve. The inflammation in the left leg is still improving however we have continuous surface layer epithelial loss posteriorly. There is no overt cellulitis in the amount of edema in both legs is really quite good. He states he is using his compression pumps on the left leg once a day for 5 out of 7 days 12/01/17; very small superficial areas on the right lateral leg continue to improve. Edema control in both legs is better today. He has continued loss of surface epithelialization and left posterior calf although I think this is better. We have been using silver alginate with large number of absorptive secondary dressings 4 layer on the left Unna boot on the right at his request. He tells me he is using his compression pumps once a day 12/08/17; he has no open area on the right leg is edema control is good here. On the left leg however he has marked erythema and tenderness breakdown of skin. He has  what appears to be a wrap injury just distal to the popliteal fossa. 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 There was swelling erythema and breakdown in the right leg. He has much worse skin breakdown in the left leg as well multiple open areas medially and posteriorly as well as laterally. He tells me he has been using his compression pumps but tells me he feels that the drainage out of his leg is worse when he uses a compression pumps. T be fair to him he is been saying this o for a  while however I don't know that I have really been listening to this. I wonder if the compression pumps are working properly 12/22/17;. Once again he arrives with severe erythema, weeping edema from the left greater than right leg. Noncompliance with compression pumps. New this visit he is complaining of pain on the lateral aspect of the right leg and the medial aspect of his right thigh. He apparently saw his cardiologist Dr. [name] who was ordered an echocardiogram area and I think this is a step in the right direction 12/25/17; started his doxycycline Monday night. There is still intense erythema of the right leg especially in the anterior thigh although there is less tenderness. The erythema around the wound on the right lateral calf also is less tender. He still complaining of pain in the left heel. His wounds are about the same right lateral left medial left lateral. Superficial but certainly not close to closure. He denies being systemically unwell no fever chills no abdominal pain no diarrhea 12/29/17; back in follow-up of his extensive right calf and right thigh cellulitis. I added amoxicillin to cover possible doxycycline resistant  strep. This seems to of done the trick he is in much less pain there is much less erythema and swelling. He has his echocardiogram at 11:00 this morning. X-ray of the left heel was also negative. 01/05/18; the patient arrived with his edema under much better control. Now that he is retired he is able to use his compression pumps daily and sometimes twice a day per the patient. He has a wound on the right leg the lateral wound looks better. Area on the left leg also looks a lot better. He has no evidence of cellulitis in his bilateral thighs I had a quick peak at his echocardiogram. He is in normal ejection fraction and normal left ventricular function. He has moderate pulmonary hypertension moderately reduced right ventricular function. One would have to wonder about chronic sleep apnea although he says he doesn't snore. He'll review the echocardiogram with his cardiologist. 01/12/18; the patient arrives with the edema in both legs under exemplary control. He is using his compression pumps daily and sometimes twice daily. His wound on the right lateral leg is just about closed. He still has some weeping areas on the posterior left calf and lateral left calf although everything is just about closed here as well. I have spoken with Aldean Baker who is the patient's nurse practitioner and infectious disease. She was concerned that the patient had not understood that the parenteral penicillin injections he was receiving for cellulitis prophylaxis was actually benefiting him. I don't think the patient actually saw that I would tend to agree we were certainly dealing with less infections although he had a serious one last month. 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 He is healed. He'll be placed in juxtalite compression wraps and increase his lymphedema pumps to twice daily. We will follow up again next week to ensure there are no issues with the new  regiment. 01/20/18-he is here for evaluation of bilateral lower extremity weeping edema. Yesterday he was placed in compression wrap to the right lower extremity and compression stocking to left lower shrubbery. He states he uses lymphedema pumps last night and again this morning and noted a blister to the left lower extremity. On exam he was noted to have drainage to the right lower extremity. He will be placed in Unna boots bilaterally and follow-up next week 01/26/18; patient was  actually discharged a week ago to his own juxta light stockings only to return the next day with bilateral lower extremity weeping edema.he was placed in bilateral Unna boots. He arrives today with pain in the back of his left leg. There is no open area on the right leg however there is a linear/wrap injury on the left leg and weeping edema on the left leg posteriorly. I spoke with infectious disease about 10 days ago. They were disappointed that the patient elected to discontinue prophylactic intramuscular penicillin shots as they felt it was particularly beneficial in reducing the frequency of his cellulitis. I discussed this with the patient today. He does not share this view. He'll definitely need antibiotics today. Finally he is traveling to [place] and trauma leaving this Saturday and returning a week later and he does not travel with his pumps. He is going by car 01/30/18; patient was seen 4 days ago and brought back in today for review of cellulitis in the left leg posteriorly. I put him on amoxicillin this really hasn't helped as much as I might like. He is also worried because he is traveling to [facility] trauma by car. Finally we will be rewrapping him. There is no open area on the right leg over his left leg has multiple weeping areas as usual 02/09/18; The same wrap on for 10 days. He did not pick up the last doxycycline I prescribed for him. He apparently took 4 days worth he already had. There is nothing open on  his right leg and the edema control is really quite good. He's had damage in the left leg medially and laterally especially probably related to the prolonged use of Unna boots 02/12/18; the patient arrived in clinic today for a nurse visit/wrap change. He complained of a lot of pain in the left posterior calf. He is taking doxycycline that I previously prescribed for him. Unfortunately even though he used his stockings and apparently used to compression pumps twice a day he has weeping edema coming out of the lateral part of his right leg. This is coming from the lower anterior lateral skin area. 02/16/18; the patient has finished his doxycycline and will finish the amoxicillin 2 days. The area of cellulitis in the left calf posteriorly has resolved. He is no longer having any pain. He tells me he is using his compression pumps at least once a day sometimes twice. 02/23/18; the patient finished his doxycycline and Amoxil last week. On Friday he noticed a small erythematous circle about the size of a quarter on the left lower leg just above his ankle. This rapidly expanded and he now has erythema on the lateral and posterior part of the thigh. This is bright red. Also has an area on the dorsal foot just above his toes and a tender area just below the left popliteal fossa. He came off his prophylactic penicillin injections at his own insistence one or 2 months ago. This is obviously deteriorated since then 03/02/18; patient is on doxycycline and Amoxil. Culture I did last week of the weeping area on the back of his left calf grew group B strep. I have therefore renewed the amoxicillin 500 3 times a day for a further week. He has not been systemically unwell. Still complaining of an area of discomfort right under his left popliteal fossa. There is no open wound on the right leg. He tells me that he is using his pumps twice a day on most days 03/09/18; patient arrives in  clinic today completing his amoxicillin  today. The cellulitis on his left leg is better. Furthermore he tells me that he had intramuscular penicillin shots that his primary care office today. However he also states that the wrap on his right leg fell down shortly after leaving clinic last week. He developed a large blister that was present when he came in for a nurse visit later in the week and then he developed intense discomfort around this area.He tells me he is using his compression pumps 03/16/18; the patient has completed his doxycycline. The infectious part of this/cellulitis in the left heel area left popliteal area is a lot better. He has 2 open areas on the right calf. Still areas on the left calf but this is a lot better as well. 03/24/18; the patient arrives complaining of pain in the left popliteal area again. He thinks some of this is wrap injury. He has no open area on the right leg and really no open area on the left calf either except for the popliteal area. He claims to be compliant with the compression pumps 03/31/18; I gave him doxycycline last week because of cellulitis in the left popliteal area. This is a lot better although the surface epithelium is denuded off and response to this. He arrives today with uncontrolled edema in the right calf area as well as a fingernail injury in the right lateral calf. There is only a few open areas on the left 04/06/18; I gave him amoxicillin doxycycline over the last 2 weeks that the amoxicillin should be completing currently. He is not complaining of any pain or systemic symptoms. The only open areas see has is on the right lateral lower leg paradoxically I cannot see anything on the left lower leg. He tells me he is using his compression pumps twice a day on most days. Silver alginate to the wounds that are open under 4 layer compression 04/13/18; he completed antibiotics and has no new complaints. Using his compression pumps. Silver alginate that anything that's opened 04/20/18; he is  using his compression pumps religiously. Silver alginate 4 layer compression anything that's opened. He comes in today with no open wounds on the left leg but 3 on the right including a new one posteriorly. He has 2 on the right lateral and one on the right posterior. He likes Unna boots on the right leg for reasons that aren't really clear we had the usual 4 layer compression on the left. It may be necessary to move to the 4 layer compression on the right however for now I left them in the Unna boots 04/27/18; he is using his compression pumps at least once a day. He has still the wounds on the right lateral calf. The area right posteriorly has closed. He does not have an open wound on the left under 4 layer compression however on the dorsal left foot just proximal to the toes and the left third toe 2 small open areas were identified 05/11/18; he has not uses compression pumps. The areas on the right lateral calf have coalesced into one large wound necrotic surface. On the left side he has one small wound anteriorly however the edema is now weeping out of a large part of his left leg. He says he wasn't using his pumps because of the weeping fluid. I explained to him that this is the time he needs to pump more 05/18/18; patient states he is using his compression pumps twice a day. The area on  the right lateral large wound albeit superficial. On the left side he has innumerable number of small new wounds on the left calf particularly laterally but several anteriorly and medially. All these appear to have healthy granulated base these look like the remnants of blisters however they occurred under compression. The patient arrives in clinic today with his legs somewhat better. There is certainly less edema, less multiple open areas on the left calf and the right anterior leg looks somewhat better as well superficial and a little smaller. However he relates pain and erythema over the last 3-4 days in the thigh  and I looked at this today. He has not been systemically unwell no fever no chills no change in blood sugar values 05/25/18; comes in today in a better state. The severe cellulitis on his left leg seems better with the Keflex. Not as tender. He has not been systemically unwell Hard to find an open wound on the left lower leg using his compression pumps twice a day The confluent wounds on his right lateral calf somewhat better looking. These will ultimately need debridement I didn't do this today. 06/01/18; the severe cellulitis on the left anterior thigh has resolved and he is completed his Keflex. There is no open wound on the left leg however there is a superficial excoriation at the base of the third toe dorsally. Skin on the bottom of his left foot is macerated looking. The left the wounds on the lateral right leg actually looks some better although he did require debridement of the top half of this wound area with an open curet 06/09/18 on evaluation today patient appears to be doing poorly in regard to his right lower extremity in particular this appears to likely be infected he has very thick purulent discharge along with a bright green tent to the discharge. This makes me concerned about the possibility of pseudomonas. He's also having increased discomfort at this point on evaluation. Fortunately there does not appear to be any evidence of infection spreading to the other location at this time. 06/16/18 on evaluation today patient appears to actually be doing fairly well. His ulcer has actually diminished in size quite significantly at this point which is good news. Nonetheless he still does have some evidence of infection he did see infectious disease this morning before coming here for his appointment. I did review the results of their evaluation and their note today. They did actually have him discontinue the Cipro and initiate treatment with linezolid at this time. He is doing this for the  next seven days and they recommended a follow-up in four months with them. He is the keep a log of the need for intermittent antibiotic therapy between now and when he falls back up with infectious disease. This will help them gaze what exactly they need to do to try and help them out. 06/23/18; the patient arrives today with no open wounds on the left leg and left third toe healed. He is been using his compression pumps twice a day. On the right lateral leg he still has a sizable wound but this is a lot better than last time I saw this. 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 Has been using silver alginate under 4 layer compression 06/30/18; the only open wound he has is on the right lateral leg and this looks healthy. No debridement is required. We have  been using silver alginate. He does not have an open wound on the left leg. There is apparently some drainage from the dorsal proximal third toe on the left although I see no open wound here. 07/03/18 on evaluation today patient was actually here just for a nurse visit rapid change. However when he was here on Wednesday for his rat change due to having been healed on the left and then developing blisters we initiated the wrap again knowing that he would be back today for [place] to reevaluate and see were at. Unfortunately he has developed some cellulitis into the proximal portion of his right lower extremity even into the region of his thigh. 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 He was placed on one as what at that point. Nonetheless he is done with that and has been tolerating it well otherwise. Doxycycline which in the past really did not seem to be effective for him. Nonetheless I think the best option may be for [place] to definitely reinitiate the antibiotics for a longer period of time. 07/07/18; since I  last saw this patient a week ago he has had a difficult time. At that point he did not have an open wound on his left leg. We transitioned him into juxta light stockings. He was apparently in the clinic the next day with blisters on the left lateral and left medial lower calf. He also had weeping edema fluid. He was put back into a compression wrap. He was also in the clinic on Friday with intense erythema in his right thigh. Per the patient he was started on Bactrim however that didn't work at all in terms of relieving his pain and swelling. He has taken 3 doxycycline that he had left over from last time and that seems to of helped. He has blistering on the right thigh as well. 07/14/18; the erythema on his right thigh has gotten better with doxycycline that he is finishing. 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 Presumably this cellulitis in the thigh was not related to that although I think this is a bit concerning going forward. He still has an area on the right lateral calf the blister on the right medial calf just below the knee that was discussed above. On the left 2 small open areas left medial and left lateral. Edema control is adequate. He is using his compression pumps twice a day 07/20/18; continued improvement in the condition of both legs especially the edema in his bilateral thighs. He tells me he is been losing weight through a combination of diet and exercise. He is using his compression pumps twice a day. So overall she made to the remaining wounds 07/27/2018; continued improvement in condition of both legs. His edema is well controlled. The area on the right lateral leg is just about closed he had one blisters show up on the medial left upper calf. We have him in 4 layer compression. He is going on a 10-day trip to [place], [place] and [place]. He will be driving. He wants to wear Unna boots because of  the lessening amount of constriction. He will not use compression pumps while he is away 08/05/18 on evaluation today patient actually appears to be doing decently well all things considered in regard to his bilateral lower extremities. The worst ulcer is actually only posterior aspect of  his left lower extremity with a four layer compression wrap cut into his leg a couple weeks back. He did have a trip and actually had [employer] for the trip that he is worn since he was last here. Nonetheless he feels like the [employer] actually do better for him his swelling is up a little bit but he also with his trip was not taking his Lasix on a regular set schedule like he was supposed to be. He states that obviously the reason being that he cannot drive and keep going without having to urinate too frequently which makes it difficult. He did not have his pumps with him while he was away either which I think also maybe playing a role here too. 08/13/2018; the patient only has a small open wound on the right lateral calf which is a big improvement in the last month or 2. He also has the area posteriorly just below the posterior fossa on the left which I think was a wrap injury from several weeks ago. He has no current evidence of cellulitis. He tells me he is back into his compression pumps twice a day. He also tells me that while he was at the laundromat somebody stole a section of his extremitease stockings 08/20/2018; back in the clinic with a much improved state. He only has small areas on the right lateral mid calf which is just about healed. This was is more substantial area for quite a prolonged period of time. He has a small open area on the left anterior tibia. The area on the posterior calf just below the popliteal fossa is closed today. He is using his compression pumps twice a day 08/28/2018; patient has no open wound on the right leg. He has a smattering of open areas on the calf with some  weeping lymphedema. More problematically than that it looks as though his wraps of slipped down in his usual he has very angry upper area of edema just below the right medial knee and on the right lateral calf. He has no open area on his feet. The patient is traveling to [facility] next week. I will send him in an antibiotic. We will continue to wrap the right leg. We ordered extremitease stockings for him last week and I plan to transition the right leg to a stocking when he gets home which will be in 10 days time. As usual he is very reluctant to take his pumps with him when he travels 09/07/2018; patient returns from [facility]. He shows me a picture of his left leg in the mid part of his trip last week with intense fire engine erythema. The picture look bad enough I would have considered sending him to the hospital. Instead he went to the wound care center in [facility]. They did not prescribe him antibiotics but he did take some doxycycline he had leftover from a previous visit. I had given him trimethoprim sulfamethoxazole before he left this did not work according to the patient. This is resulted in some improvement fortunately. He comes back with a large wound on the left posterior calf. Smaller area on the left anterior tibia. Denuded blisters on the dorsal left foot over his toes. Does not have much in the way of wounds on the right leg although he does have a very tender area on the right posterior area just below the popliteal fossa also suggestive of infection. He promises me he is back on his  pumps twice a day 09/15/2018; the intense cellulitis in his left lower calf is a lot better. The wound area on the posterior left calf is also so better. However he has reasonably extensive wounds on the dorsal aspect of his second and third toes and the proximal foot just at the base of the toes. There is nothing open on the right leg 09/22/2018; the patient has excellent edema  control in his legs bilaterally. He is using his external compression pumps twice a day. He has no open area on the right leg and only the areas in the left foot dorsally second and third toe area on the left side. He does not have any signs of active cellulitis. 10/06/2018; the patient has good edema control bilaterally. He has no open wound on the right leg. There is a blister in the posterior aspect of his left calf that we had to deal with today. He is using his compression pumps twice a day. There is no signs of active cellulitis. We have been using silver alginate to the wound areas. He still has vulnerable areas on the base of his left first second toes dorsally He has a his extremities stockings and we are going to transition him today into the stocking on the right leg. He is cautioned that he will need to continue to use the compression pumps twice a day. If he notices uncontrolled edema in the right leg he may need to go to 3 times a day. 10/13/2018; the patient came in for a nurse check on Friday he has a large flaccid blister on the right medial calf just below the knee. We unroofed this. He has this and a new area underneath the posterior mid calf which was undoubtedly a blister as well. He also has several small areas on the right which is the area we put his extremities stocking on. 10/19/2018; the patient went to see infectious disease this morning I am not sure if that was a routine follow-up in any case the doxycycline I had given him was discontinued and started on linezolid. He has not started this. It is easy to look at his left calf and the inflammation and think this is cellulitis however he is very tender in the tissue just below the popliteal fossa and I have no doubt that there is infection going on here. He states the problem he is having is that with the compression pumps the edema goes down and then starts walking the wrap falls down. We will see if we can adhere this. He  has 1 or 2 minuscule open areas on the right still areas that are weeping on the posterior left calf, the base of his left second and third toes 10/26/18; back today in clinic with quite of skin breakdown in his left anterior leg. This may have been infection the area below the popliteal fossa seems a lot better however tremendous epithelial loss on the left anterior mid tibia area over quite inexpensive tissue. He has 2 blisters on the right side but no other open wound here. 10/29/2018; came in urgently to see [place] today and we worked him in for review. He states that the 4 layer compression on the right leg caused pain he had to cut it down to roughly his mid calf this caused swelling above the wrap and he has blisters and skin breakdown today. As a result of the pain he has not been using his pumps. Both legs are a lot more  edematous and there is a lot of weeping fluid. 11/02/18; arrives in clinic with continued difficulties in the right leg> left. Leg is swollen and painful. multiple skin blisters and new open areas especially laterally. He has not been using his pumps on the right leg. 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 He is not systemically unwell. 11/09/2018; the patient claims he is being compliant with his pumps. He is finished the doxycycline I gave him last week. Culture I did of the wound on the right lateral leg showed a few very resistant methicillin staph aureus. This was resistant to doxycycline. Nevertheless he states the pain in the leg is a lot better which makes me wonder if the cultured organism was not really what was causing the problem nevertheless this is a very dangerous organism to be culturing out of any wound. His right leg is still a lot larger than the left. He is using an Radio broadcast assistant on this area, he blames a 4-layer compression for causing the original skin breakdown which I doubt is true however I cannot talk him out  of it. We have been using silver alginate to all of these areas which were initially blisters 11/16/2018; patient is being compliant with his external compression pumps at twice a day. Miraculously he arrives in clinic today with absolutely no open wounds. He has better edema control on the left where he has been using 4 layer compression versus wound of wounds on the right and I pointed this out to him. There is no inflammation in the skin in his lower legs which is also somewhat unusual for him. There is no open wounds on the dorsal left foot. He has extremitease stockings at home and I have asked him to bring these in next week. 11/25/18 patient's lower extremity on examination today on the left appears for the most part to be wound free. He does have an open wound on the lateral aspect of the right lower extremity but this is minimal compared to what I've seen in past. He does request that we go ahead and wrap the left leg as well even though there's nothing open just so hopefully it will not reopen in short order. 1/28; patient has superficial open wounds on the right lateral calf left anterior calf and left posterior calf. His edema control is adequate. 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 We have been using silver alginate as the primary dressing. He claims compliance with his compression pumps 2/4; patient has superficial open wounds on numerous areas of his left calf and again one on the left dorsal foot. The areas on the right lateral calf have healed. The cellulitis that I gave him doxycycline for last week is also resolved this was mostly on the left anterior calf just below the tibial tuberosity. His edema looks fairly well-controlled. He tells me he went to see his primary doctor today and had blood work ordered 2/11; once again he has several open areas on the left calf left tibial area. Most of  these are small and appear to have healthy granulation. He does not have anything open on the right. The edema and control in his thighs is pretty good which is usually a good indication he has been using his pumps as requested. 2/18; he continues to have several small areas on the left calf and left tibial  area. Most of these are small healthy granulation. We put him in his stocking on the right leg last week and he arrives with a superficial open area over the right upper tibia and a fairly large area on the right lateral tibia in similar condition. His edema control actually does not look too bad, he claims to be using his compression pumps twice a day 2/25. Continued small areas on the left calf and left tibial area. New areas especially on the right are identified just below the tibial tuberosity and on the right upper tibia itself. There are also areas of weeping edema fluid even without an obvious wound. He does not have a considerable degree of lymphedema but clearly there is more edema here than his skin can handle. He states he is using the pumps twice a day. We have an Unna boot on the right and 4 layer compression on the left. 3/3; he continues to have an area on the right lateral calf and right posterior calf just below the popliteal fossa. There is a fair amount of tenderness around the wound on the popliteal fossa but I did not see any evidence of cellulitis, could just be that the wrap came down and rubbed in this area. He does not have an open area on the left leg however there is an area on the left dorsal foot at the base of the third toe We have been using silver alginate to all wound areas 3/10; he did not have an open area on his left leg last time he was here a week ago. T oday he arrives with a horizontal wound just below the tibial tuberosity and an area on the left lateral calf. He has intense erythema and tenderness in this area. The area is on the right lateral calf and  right posterior calf better than last week. We have been using silver alginate as usual 3/18 - Patient returns with 3 small open areas on left calf, and 1 small open area on right calf, the skin looks ok with no significant erythema, he continues the UNA boot on right and 4 layer compression on left. The right lateral calf wound is closed , the right posterior is small area. we will continue silver alginate to the areas. 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 He is been tolerating the dressing changes without complication. Fortunately she has made excellent improvement in regard to the overall status of his wounds. Unfortunately every time we cease wrapping him he ends up reopening in causing more significant issues at that point. Again I'm unsure of the best direction to take although I think the lymphedema clinic may be appropriate for him. 02/03/19 on evaluation today patient appears to be doing well in regard to the wounds that we saw him for last week unfortunately he has a new area on the proximal portion of his right medial/posterior lower extremity where the wrap somewhat slowed down and caused swelling and a blister to rub and open. Unfortunately this is the only opening that he has on either leg at this point. 02/17/19 on evaluation today patient's bilateral lower extremities appear to be doing well. He still completely healed in regard to the left lower extremity. In regard to the right lower extremity the area where the wrap and slid down  and caused the blister still seems to be slightly open although this is dramatically better than during the last evaluation two weeks ago. I'm very pleased with the way this stands overall. 03/03/19 on evaluation today patient appears to be doing well in regard  to his right lower extremity in general although he did have a new blister open this does not appear to be showing any evidence of active infection at this time. Fortunately there's No fevers, chills, nausea, or vomiting noted at this time. Overall I feel like he is making good progress it does feel like that the right leg will we perform the [employer] seems to do with a bit better than three layer wrap on the left which slid down on him. We may switch to doing bilateral in the book wraps. 5/4; I have not seen Mr. Goodenow in quite some time. According to our case manager he did not have an open wound on his left leg last week. He had 1 remaining wound on the right posterior medial calf. He arrives today with multiple openings on the left leg probably were blisters and/or wrap injuries from Unna boots. I do not think the Unna boot's will provide adequate compression on the left. I am also not clear about the frequency he is using the compression pumps. 03/17/19 on evaluation today patient appears to be doing excellent in regard to his lower extremities compared to last week's evaluation apparently. He had gotten significantly worse last week which is unfortunate. The [employer] wrap on the left did not seem to do very well for him at all and in fact it didn't control his swelling significantly enough he had an additional outbreak. Subsequently we go back to the four layer compression wrap on the left. This is good news. At least in that he is doing better and the wound seem to be killing him. He still has not heard anything from the lymphedema clinic. 03/24/19 on evaluation today patient actually appears to be doing much better in regard to his bilateral lower Trinity as compared to last week when I saw him. Fortunately there's no signs of active infection at this time. He has been tolerating the dressing changes without complication. Overall I'm extremely pleased with the progress and appearance in  general. 04/07/19 on evaluation today patient appears to be doing well in regard to his bilateral lower extremities. His swelling is significantly down from where it was previous. With that being said he does have a couple blisters still open at this point but fortunately nothing that seems to be too severe and again the majority of the larger openings has healed at this time. 04/14/19 on evaluation today patient actually appears to be doing quite well in regard to his bilateral lower extremities in fact I'm not even sure there's anything significantly open at this time at any site. Nonetheless he did have some trouble with these wraps where they are somewhat irritating him secondary to the fact that he has noted that the graph wasn't too close down to the end of this foot in a little bit short as well up to his knee. Otherwise things seem to be doing quite well. 04/21/19 upon evaluation today patient's wound bed actually showed evidence of being completely healed in regard to both lower extremities which is excellent news. There does not appear to be any signs of active infection which is also good news. I'm very pleased in this regard. No fevers, chills, nausea, or vomiting noted  at this time. 04/28/19 on evaluation today patient appears to be doing a little bit worse in regard to both lower extremities on the left mainly due to the fact that when he went infection disease the wrap was not wrapped quite high enough he developed a blister above this. On the right he is a small open area of nothing too significant but again this is continuing to give him some trouble he has been were in the Velcro compression that he has at home. 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 He's been tolerating the dressing changes without complication. Fortunately there's no signs of active infection at this time. No fevers, chills, nausea, or vomiting noted at  this time. We have been trying to get an appointment with her lymphedema clinic in [facility] but unfortunately nobody can get them on phone with not been able to even fax information over the patient likewise is not been able to get in touch with them. Overall I'm not sure exactly what's going on here with to reach out again today. 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 Still having a lot of drainage unfortunately. He tells me especially in the left but even on the right. There's no signs of active infection which is good news we've been using so ratcheted up to this point. 05/19/19 on evaluation today patient actually appears to be doing quite well with regard to his left lower extremity which is great news. Fortunately in regard to the right lower extremity has an issues with his wrap and he subsequently did remove this from what I'm understanding. Nonetheless long story short is what he had rewrapped once he removed it subsequently had maggots underneath this wrap whenever he came in for evaluation today. With that being said they were obviously completely cleaned away by the nursing staff. The visit today which is excellent news. However he does appear to potentially have some infection around the right ankle region where the maggots were located as well. He will likely require anabiotic therapy today. 05/26/19 on evaluation today patient actually appears to be doing much better in regard to his bilateral lower extremities. I feel like the infection is under much better control. With that being said there were maggots noted when the wrap was removed yet again today. Again this could have potentially been left over from previous although at this time there does not appear to be any signs of significant drainage there was obviously on the wrap some drainage as well this contracted gnats or  otherwise. Either way I do not see anything that appears to be doing worse in my pinion and in fact I think his drainage has slowed down quite significantly likely mainly due to the fact to his infection being under better control. 06/02/2019 on evaluation today patient actually appears to be doing well with regard to his bilateral lower extremities there is no signs of active infection at this time which is great news. With that being said he does have several open areas more so on the right than the left but nonetheless these are all significantly better than previously noted. 06/09/2019 on evaluation today patient actually appears to be doing well. His wrap stayed up and he did not cause any problems he had more drainage on the right compared to the left but overall I do not see  any major issues at this time which is great news. 06/16/2019 on evaluation today patient appears to be doing excellent with regard to his lower extremities the only area that is open is a new blister that can have opened as of today on the medial ankle on the left. Other than this he really seems to be doing great I see no major issues at this point. 06/23/2019 on evaluation today patient appears to be doing quite well with regard to his bilateral lower extremities. In fact he actually appears to be almost completely healed there is a small area of weeping noted of the right lower extremity just above the ankle. Nonetheless fortunately there is no signs of active infection at this time which is good news. No fevers, chills, nausea, vomiting, or diarrhea. 8/24; the patient arrived for a nurse visit today but complained of very significant pain in the left leg and therefore I was asked to look at this. Noted that he did not have an open area on the left leg last week nevertheless this was wrapped. The patient states that he is not been able to put his compression pumps on the left leg because of the discomfort. He has not been  systemically unwell 06/30/2019 on evaluation today patient unfortunately despite being excellent last week is doing much worse with regard to his left lower extremity today. In fact he had to come in for a nurse on Monday where his left leg had to be rewrapped due to excessive weeping Dr. Leanord Hawking placed him on doxycycline at that point. Fortunately there is no signs of active infection Systemically at this time which is good news. 07/07/2019 in regard to the patient's wounds today he actually seems to be doing well with his right lower extremity there really is nothing open or draining at this point this is great news. Unfortunately the left lower extremity is given him additional trouble at this time. There does not appear to be any signs of active infection nonetheless he does have a lot of edema and swelling noted at this point as well as blistering all of which has led to a much more poor appearing leg at this time compared to where it was 2 weeks ago when it was almost completely healed. Obviously this is a little discouraging for the patient. He is try to contact the lymphedema clinic in [place] he has not been able to get through to them. 07/14/2019 on evaluation today patient actually appears to be doing slightly better with regard to his left lower extremity ulcers. Overall I do feel like at least at the top of the wrap that we have been placing this area has healed quite nicely and looks much better. The remainder of the leg is showing signs of improvement. Unfortunately in the thigh area he still has an open region on the left and again on the right he has been utilizing just a Band-Aid on an area that also opened on the thigh. Again this is an area that were not able to wrap although we did do an Ace wrap to provide some compression that something that obviously is a little less effective than the compression wraps we have been using on the lower portion of the leg. He does have an  appointment with the lymphedema clinic in [facility] on Friday. 07/21/2019 on evaluation today patient appears to be doing better with regard to his lower extremity ulcers. He has been tolerating the dressing changes without complication. Fortunately there is  no signs of active infection at this time. No fevers, chills, nausea, vomiting, or diarrhea. I did receive the paperwork from the physical therapist at the lymphedema clinic in [place]. Subsequently I signed off on that this morning and sent that back to him for further progression with the treatment plan. 07/28/2019 on evaluation today patient appears to be doing very well with regard to his right lower extremity where I do not see any open wounds at this point. Fortunately he is feeling great as far as that is concerned as well. In regard to the left lower extremity he has been having issues with still several areas of weeping and edema although the upper leg is doing better his lower leg still I think is going require the compression wrap at this time. No fevers, chills, nausea, vomiting, or diarrhea. 08/04/2019 on evaluation today patient unfortunately is having new wounds on the right lower extremity. Again we have been using Unna boot wrap on that side. We switched him to using his juxta lite wrap at home. With that being said he tells me he has been using it although his legs extremely swollen and to be honest really does not appear that he has been. I cannot know that for sure however. Nonetheless he has multiple new wounds on the right lower extremity at this time. Obviously we will have to see about getting this rewrapped for him today. 08/11/2019 on evaluation today patient appears to be doing fairly well with regard to his wounds. He has been tolerating the dressing changes including the compression wraps without complication. He still has a lot of edema in his upper thigh regions bilaterally he is supposed to be seeing the  lymphedema clinic on the 15th of this month once his wraps arrive for the upper part of his legs. 08/18/2019 on evaluation today patient appears to be doing well with regard to his bilateral lower extremities at this point. He has been tolerating the dressing changes without complication. Fortunately there is no signs of active infection which is also good news. He does have a couple weeping areas on the first and second toe of the right foot he also has just a small area on the left foot upper leg and a small area on the left lower leg but overall he is doing quite well in my opinion. He is supposed to be getting his wraps shortly in fact tomorrow and then subsequently is seeing the lymphedema clinic next Wednesday on the 21st. Of note he is also leaving on the 25th to go on vacation for a week to the beach. For that reason and since there is some uncertainty about what there can be doing at lymphedema clinic next Wednesday I am get a make an appointment for next Friday here for [place] to see what we need to do for him prior to him leaving for vacation. 10/23; patient arrives in considerable pain predominantly in the upper posterior calf just distal to the popliteal fossa also in the wound anteriorly above the major wound. This is probably cellulitis and he has had this recurrently in the past. He has no open wound on the right side and he has had an Radio broadcast assistant in that area. Finally I note that he has an area on the left posterior calf which by enlarge is mostly epithelialized. This protrudes beyond the borders of the surrounding skin in the setting of dry scaly skin and lymphedema. The patient is leaving for [place] on Sunday. Per  his longstanding pattern, he will not take his compression pumps with him predominantly out of fear that they will be stolen. He therefore asked that we put a Unna boot back on the right leg. He will also contact the wound care center in [facility] to see if they can  change his dressing in the mid week. 11/3; patient returned from his vacation to [facility]. He was seen on 1 occasion at their wound care center. They did a 2 layer compression system as they did not have our 4-layer wrap. I am not completely certain what they put on the wounds. They did not change the Unna boot on the right. The patient is also seeing a lymphedema specialist physical therapist in [place]. It appears that he has some compression sleeve for his thighs which indeed look quite a bit better than I am used to seeing. He pumps over these with his external compression pumps. 11/10; the patient has a new wound on the right medial thigh otherwise there is no open areas on the right. He has an area on the left leg posteriorly anteriorly and medially and an area over the left second toe. We have been using silver alginate. He thinks the injury on his thigh is secondary to friction from the compression sleeve he has. 11/17; the patient has a new wound on the right medial thigh last week. He thinks this is because he did not have a underlying stocking for his thigh juxta lite apparatus. He now has this. The area is fairly large and somewhat angry but I do not think he has underlying cellulitis. He has a intact blister on the right anterior tibial area. Small wound on the right great toe dorsally Small area on the medial left calf. 11/30; the patient does not have any open areas on his right leg and we did not take his juxta lite stocking off. However he states that on Friday his compression wrap fell down lodging around his upper mid calf area. As usual this creates a lot of problems for him. He called urgently today to be seen for a nurse visit however the nurse visit turned into a provider visit because of extreme erythema and pain in the left anterior tibia extending laterally and posteriorly. The area that is problematic is extensive 10/06/2019 upon evaluation today patient actually  appears to be doing poorly in regard to his left lower extremity. He Dr. Leanord Hawking did place him on doxycycline this past Monday apparently due to the fact that he was doing much worse in regard to this left leg. Fortunately the doxycycline does seem to be helping. Unfortunately we are still having a very difficult time getting his edema under any type of control in order to anticipate discharge at some point. The only way were really able to control his lymphedema really is with compression wraps and that has only even seemingly temporary. He has been seeing a lymphedema clinic they are trying to help in this regard but still this has been somewhat frustrating in general for the patient. 10/13/19 on evaluation today patient appears to be doing excellent with regard to his right lower extremity as far as the wounds are concerned. His swelling is still quite extensive unfortunately. He is still having a lot of drainage from the thigh areas bilaterally which is unfortunate. He's been going to lymphedema clinic but again he still really does not have this edema under control as far as his lower extremities are concern. With regard  to his left lower extremity this seems to be improving and I do believe the doxycycline has been of benefit for him. He is about to complete the doxycycline. 10/20/2019 on evaluation today patient appears to be doing poorly in regard to his bilateral lower extremities. More in the right thigh he has a lot of irritation at this site unfortunately. In regard to the left lower extremity the wrap was not quite as high it appears and does seem to have caused him some trouble as well. Fortunately there is no evidence of systemic infection though he does have some blue-green drainage which has me concerned for the possibility of Pseudomonas. He tells me he is previously taking Cipro without complications and he really does not care for Levaquin however due to some of the side effects he  has. He is not allergic to any medications specifically antibiotics that were aware of. 10/27/2019 on evaluation today patient actually does appear to be for the most part doing better when compared to last week's evaluation. With that being said he still has multiple open wounds over the bilateral lower extremities. He actually forgot to start taking the Cipro and states that he still has the whole bottle. He does have several new blisters on left lower extremity today I think I would recommend he go ahead and take the Cipro based on what I am seeing at this point. 12/30-Patient comes at 1 week visit, 4 layer compression wraps on the left and Unna boot on the right, primary dressing Xtrasorb and silver alginate. Patient is taking his Cipro and has a few more days left probably 5-6, and the legs are doing better. He states he is using his compressions devices which I believe he has 11/10/2019 on evaluation today patient actually appears to be much better than last time I saw him 2 weeks ago. His wounds are significantly improved and overall I am very pleased in this regard. Fortunately there is no signs of active infection at this time. He is just a couple of days away from completing Cipro. Overall his edema is much better he has been using his lymphedema pumps which I think is also helping at this point. 11/17/2019 on evaluation today patient appears to be doing excellent in regard to his wounds in general. His legs are swollen but not nearly as much as they have been in the past. Fortunately he is tolerating the compression wraps without complication. No fevers, chills, nausea, vomiting, or diarrhea. He does have some erythema however in the distal portion of his right lower extremity specifically around the forefoot and toes there is a little bit of warmth here as well. 11/24/2019 on evaluation today patient appears to be doing well with regard to his right lower extremity I really do not see any open  wounds at this point. His left lower extremity does have several open areas and his right medial thigh also is open. Other than this however overall the patient seems to be making good progress and I am very pleased at this point. 12/01/2019 on evaluation today patient appears to be doing poorly at this point in regard to his left lower extremity has several new blisters despite the fact that we have him in compression wraps. In fact he had a 4-layer compression wrap, his upper thigh wrapped from lymphedema clinic, and a juxta light over top of the 4 layer compression wrap the lymphedema clinic applied and despite all this he still develop blisters underneath. Obviously this does  have me concerned about the fact that unfortunately despite what we are doing to try to get wounds healed he continues to have new areas arise I do not think he is ever good to be at the point where he can realistically just use wraps at home to keep things under control. Typically when we heal him it takes about 1-2 days before he is back in the clinic with severe breakdown and blistering of his lower extremities bilaterally. This is happened numerous times in the past. Unfortunately I think that we may need some help as far as overall fluid overload to kind of limit what we are seeing and get things under better control. 12/08/2019 on evaluation today patient presents for follow-up concerning his ongoing bilateral lower extremity edema. Unfortunately he is still having quite a bit of swelling the compression wraps are controlling this to some degree but he did see Dr. Debara Pickett his cardiologist I do have that available for review today as far as the appointment was concerned that was on 12/06/2019. Obviously that she has been 2 days ago. The patient states that he is only been taking the Lasix 80 mg 1 time a day he had told me previously he was taking this twice a day. Nonetheless Dr. Debara Pickett recommended this be up to 80 mg 2 times a  day for the patient as he did appear to be fluid overloaded. With that being said the patient states he did this yesterday and he was unable to go anywhere or do anything due to the fact that he was constantly having to urinate. Nonetheless I think that this is still good to be something that is important for him as far as trying to get his edema under control at all things that he is going to be able to just expect his wounds to get under control and things to be better without going through at least a period of time where he is trying to stabilize his fluid management in general and I think increasing the Lasix is likely the first step here. It was also mentioned the possibility that the patient may require metolazone. With that being said he wanted to have the patient take Lasix twice a day first and then reevaluating 2 months to see where things stand. 12/15/2019 upon evaluation today patient appears to be doing regard to his legs although his toes are showing some signs of weeping especially on the left at this point to some degree on the right. There does not appear to be any signs of active infection and overall I do feel like the compression wraps are doing well for him but he has not been able to take the Lasix at home and the increased dose that Dr. Debara Pickett recommended. He tells me that just not go to be feasible for him. Nonetheless I think in this case he should probably send a message to Dr. Debara Pickett in order to discuss options from the standpoint of possible admission to get the fluid off or otherwise going forward. 12/22/2019 upon evaluation today patient appears to be doing fairly well with regard to his lower extremities at this point. In fact he would be doing excellent if it was not for the fact that his right anterior thigh apparently had an allergic reaction to adhesive tape that he used. The wound itself that we have been monitoring actually appears to be healed. There is a lot of  irritation at this point. 12/29/2019 upon evaluation today patient appears to  be doing well in regard to his lower extremities. His left medial thigh is open and somewhat draining today but this is the only region that is open the right has done much better with the treatment utilizing the steroid cream that I prescribed for him last week. Overall I am pleased in that regard. Fortunately there is no signs of active infection at this time. No fevers, chills, nausea, vomiting, or diarrhea. 01/05/2020 upon evaluation today patient appears to be doing more poorly in regard to his right lower extremity at this point upon evaluation today. Unfortunately he continues to have issues in this regard and I think the biggest issue is controlling his edema. This obviously is not very well controlled at this point is been recommended that he use the Lasix twice a day but he has not been able to do that. Unfortunately I think this is leading to an issue where honestly he is not really able to effectively control his edema and therefore the wounds really are not doing significantly better. I do not think that he is going to be able to keep things under good control unless he is able to control his edema much better. I discussed this again in great detail with him today. 01/12/2020 good news is patient actually appears to be doing quite well today at this point. He does have an appointment with lymphedema clinic tomorrow. His legs appear healed and the toe on the left is almost completely healed. In general I am very pleased with how things stand at this point. 01/19/2020 upon evaluation today patient appears to actually be doing well in regard to his lower extremities there is nothing open at this point. Fortunately he has done extremely well more recently. Has been seeing lymphedema clinic as well. With that being said he has Velcro wraps for his lower legs as well as his upper legs. The only wound really is on his toe  which is the right great toe and this is barely anything even there. With all that being said I think it is good to be appropriate today to go ahead and switch him over to the Velcro compression wraps. 01/26/2020 upon evaluation today patient appears to be doing worse with regard to his lower extremities after last week switch him to Velcro compression wraps. Unfortunately he lasted less than 24 hours he did not have the sock portion of his Velcro wrap on the left leg and subsequently developed a blister underneath the Velcro portion. Obviously this is not good and not what we were looking for at this point. He states the lymphedema clinic did tell him to wear the wrap for 23 hours and take him off for 1 I am okay with that plan but again right now we got a get things back under control again he may have some cellulitis noted as well. 02/02/2020 upon evaluation today patient unfortunately appears to have several areas of blistering on his bilateral lower extremities today mainly on the feet. His legs do seem to be doing somewhat better which is good news. Fortunately there is no evidence of active infection at this time. No fevers, chills, nausea, vomiting, or diarrhea. 02/16/2020 upon evaluation today patient appears to be doing well at this time with regard to his legs. He has a couple weeping areas on his toes but for the most part everything is doing better and does appear to be sealed up on his legs which is excellent news. We can continue with wrapping him  at this point as he had every time we discontinue the wraps he just breaks out with new wounds. There is really no point in is going forward with this at this point. 03/08/2020 upon evaluation today patient actually appears to be doing quite well with regard to his lower extremity ulcers. He has just a very superficial and really almost nonexistent blister on the left lower extremity he has in general done very well with the compression wraps. With  that being said I do not see any signs of infection at this time which is good news. 03/29/2020 upon evaluation today patient appears to be doing well with regard to his wounds currently except for where he had several new areas that opened up due to some of the wrap slipping and causing him trouble. He states he did not realize they had slipped. Nonetheless he has a 1 area on the right and 3 new areas on the left. Fortunately there is no signs of active infection at this time which is great news. 04/05/2020 upon evaluation today patient actually appears to be doing quite well in general in regard to his legs currently. Fortunately there is no signs of active infection at this time. No fevers, chills, nausea, vomiting, or diarrhea. He tells me next week that he will actually be seen in the lymphedema clinic on Thursday at 10 AM I see him on Wednesday next week. 04/12/2020 upon evaluation today patient appears to be doing very well with regard to his lower extremities bilaterally. In fact he does not appear to have any open wounds at this point which is good news. Fortunately there is no signs of active infection at this time. No fevers, chills, nausea, vomiting, or diarrhea. Electronic Signature(s) Signed: 04/12/2020 2:24:52 PM By: Lenda Kelp PA-C Entered By: Lenda Kelp on 04/12/2020 14:24:52 -------------------------------------------------------------------------------- Physical Exam Details Patient Name: Date of Service: Kevin [name], Arieon J. 04/12/2020 10:45 A M Medical Record Number: [id number] Patient Account Number: [id number] Date of Birth/Sex: Treating RN: Jul 01, 1951 (69 y.o. [name] Primary Care Provider: Marney Setting, [name] Other Clinician: Referring Provider: Treating Provider/Extender: Lenda Kelp MCGO [name], [name] in Treatment: 220 Constitutional Well-nourished and well-hydrated in no acute distress. Respiratory normal breathing without  difficulty. Psychiatric this patient is able to make decisions and demonstrates good insight into disease process. Alert and Oriented x 3. pleasant and cooperative. Notes Patient's wound bed currently showed signs of good epithelization at this point. There does not appear to be any evidence of active infection and overall I am extremely pleased with where things stand today. Unfortunately he did cancel his appointment with lymphedema clinic tomorrow as he states he had to take his cousin for a surgery appointment in [place] which is also incidentally where his physical therapist is. However he states he could not work it out to make the to coincide. Electronic Signature(s) Signed: 04/12/2020 2:25:19 PM By: Lenda Kelp PA-C Entered By: Lenda Kelp on 04/12/2020 14:25:18 -------------------------------------------------------------------------------- Physician Orders Details Patient Name: Date of Service: Kevin [name], Antonia J. 04/12/2020 10:45 A M Medical Record Number: [id number] Patient Account Number: [id number] Date of Birth/Sex: Treating RN: 01/07/1951 (69 y.o. 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 Skin Barriers/Peri-Wound Care Moisturizing lotion -  to legs nightly Wound Cleansing May shower and wash wound with soap and water. Edema Control Avoid standing for long periods of time Elevate legs to the level of the heart or above for 30 minutes daily and/or when sitting, a frequency of: - whenever sitting Exercise regularly Support Garment 30-40 mm/Hg pressure to: - compression stockings and juxtalites to lower legs daily Segmental Compressive Device. - lymphadema pumps 60 minutes 2 times per day Additional Orders / Instructions Other: - take lasix as prescribed by cardiologist Electronic Signature(s) Signed: 04/12/2020 6:08:32 PM By: Zenaida Deed RN, BSN Signed: 04/12/2020 6:29:31 PM By: Lenda Kelp PA-C Entered By: Zenaida Deed on 04/12/2020 11:29:59 -------------------------------------------------------------------------------- Problem List Details Patient Name: Date of Service: [facility], Aaditya J. 04/12/2020 10:45 A M Medical Record Number: [id number] Patient Account Number: [id number] Date of Birth/Sex: Treating RN: July 05, 1951 (69 y.o. [name] Primary Care Provider: Marney Setting, [name] Other Clinician: Referring Provider: Treating Provider/Extender: Suzy Bouchard, [name] in Treatment: 220 Active Problems ICD-10 Encounter Code Description Active Date MDM Diagnosis 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 skin 06/30/2018 No Yes L97.221 Non-pressure chronic ulcer of left calf limited to breakdown of skin 09/30/2016 No Yes 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 of 01/22/2016 No Yes bilateral lower extremity I89.0 Lymphedema, not elsewhere classified 01/22/2016 No Yes E11.622 Type 2 diabetes mellitus with other skin ulcer 01/22/2016 No Yes 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, unspecified 01/22/2016 No Yes L03.116 Cellulitis of left lower limb 04/01/2017 No Yes Inactive Problems ICD-10 Code Description Active  Date Inactive Date 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 skin 06/30/2017 06/30/2017 L97.521 Non-pressure chronic ulcer of other 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 of skin 04/27/2018 04/27/2018 L03.115 Cellulitis of right lower limb 12/22/2017 12/22/2017 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 06/30/2018 06/30/2018 L97.511 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 of skin 06/30/2018 06/30/2018 Resolved Problems Electronic Signature(s) Signed: 04/12/2020 10:49:03 AM By: Lenda Kelp PA-C Entered By: Lenda Kelp on 04/12/2020 10:49:02 -------------------------------------------------------------------------------- Progress Note Details Patient Name: Date of Service: Kevin [name], Khadeem J. 04/12/2020 10:45 A M Medical Record Number: [id number] Patient Account Number: [id number] Date of Birth/Sex: Treating RN: [birth year] (69 y.o. [name] Primary Care Provider: Marney Setting, [name] Other Clinician: Referring Provider: Treating Provider/Extender: Lenda Kelp MCGO [name], [name] in Treatment: 220 Subjective Chief Complaint Information obtained from Patient patient is here for evaluation venous/lymphedema weeping History of Present Illness (HPI) Referred by PCP for consultation. Patient has long standing history of BLE venous stasis, no prior ulcerations. At beginning of month, developed cellulitis and weeping. Received IM Rocephin followed by Keflex and resolved. Wears compression stocking, appr 6 months old. Not sure strength. No present drainage. 01/22/16 this is a patient who is a type II diabetic on insulin. 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 He tells me he religiously wears pressure stockings of uncertain strength. He was here with weeping edema about 8 months ago but did not  have an open wound. Roughly a month ago he had a reopening on  his bilateral legs. He is been using bandages and Neosporin. He does not complain of pain. 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 Last BUN/creatinine I have is from 11/20/15 at 13 and 1.0 respectively 01/29/16; patient arrives today having tolerated the Profore wrap. He brought in his stockings and these are 18 mmHg stockings he bought from [employer]. The compression here is likely inadequate. He does not complain of pain or excessive drainage she has no systemic symptoms. The wound on the right looks improved as does the one on the left although one on the left is more substantial with still tissue at risk below the actual wound area on the bilateral posterior calf 02/05/16; patient arrives with poor edema control. He states that we did put a 4 layer compression on it last week. No weight appear 5 this. 02/12/16; the area on the posterior right Has healed. The left Has a substantial wound that has necrotic surface eschar that requires a debridement with a curette. 02/16/16;the patient called or a Nurse visit secondary to increased swelling. He had been in earlier in the week with his right leg healed. He was transitioned to is on pressure stocking on the right leg with the only open wound on the left, a substantial area on the left posterior calf. Note he has a history of severe lower extremity edema, he has a history of chronic atrial fibrillation but not heart failure per my notes but I'll need to research this. He is not complaining of chest pain shortness of breath or orthopnea. The intake nurse noted blisters on the previously closed right leg 02/19/16; this is the patient's regular visit day. I see him on Friday with escalating edema new wounds on the right leg and clear signs of at least right ventricular heart failure. I increased his Lasix to 40 twice a day. He is returning currently in  follow-up. States he is noticed a decrease in that the edema 02/26/16 patient's legs have much less edema. There is nothing really open on the right leg. The left leg has improved condition of the large superficial wound on the posterior left leg 03/04/16; edema control is very much better. The patient's right leg wounds have healed. On the left leg he continues to have severe venous inflammation on the posterior aspect of the left leg. There is no tenderness and I don't think any of this is cellulitis. 03/11/16; patient's right leg is married healed and he is in his own stocking. The patient's left leg has deteriorated somewhat. There is a lot of erythema around the wound on the posterior left leg. There is also a significant rim of erythema posteriorly just above where the wrap would've ended there is a new wound in this location and a lot of tenderness. Can't rule out cellulitis in this area. 03/15/16; patient's right leg remains healed and he is in his own stocking. The patient's left leg is much better than last review. His major wound on the posterior aspect of his left Is almost fully epithelialized. He has 3 small injuries from the wraps. Really. Erythema seems a lot better on antibiotics 03/18/16; right leg remains healed and he is in his own stocking. The patient's left leg is much better. The area on the posterior aspect of the left calf is fully  epithelialized. His 3 small injuries which were wrap injuries on the left are improved only one seems still open his erythema has resolved 03/25/16; patient's right leg remains healed and he is in his own stocking. There is no open area today on the left leg posterior leg is completely closed up. His wrap injuries at the superior aspect of his leg are also resolved. He looks as though he has some irritation on the dorsal ankle but this is fully epithelialized without evidence of infection. 03/28/16; we discharged this patient on Monday. Transitioned him  into his own stocking. There were problems almost immediately with uncontrolled swelling weeping edema multiple some of which have opened. He does not feel systemically unwell in particular no chest pain no shortness of breath and he does not feel 04/08/16; the edema is under better control with the Profore light wrap but he still has pitting edema. There is one large wound anteriorly 2 on the medial aspect of his left leg and 3 small areas on the superior posterior calf. Drainage is not excessive he is tolerating a Profore light well 04/15/16; put a Profore wrap on him last week. This is controlled is edema however he had a lot of pain on his left anterior foot most of his wounds are healed 04/22/16 once again the patient has denuded areas on the left anterior foot which he states are because his wrap slips up word. He saw his primary physician today is on Lasix 40 twice a day and states that he his weight is down 20 pounds over the last 3 months. 04/29/16: Much improved. left anterior foot much improved. He is now on Lasix 80 mg per day. Much improved edema control 05/06/16; I was hoping to be able to discharge him today however once again he has blisters at a low level of where the compression was placed last week mostly on his left lateral but also his left medial leg and a small area on the anterior part of the left foot. 05/09/16; apparently the patient went home after his appointment on 7/4 later in the evening developing pain in his upper medial thigh together with subjective fever and chills although his temperature was not taken. The pain was so intense he felt he would probably have to call 911. However he then remembered that he had leftover doxycycline from a previous round of antibiotics and took these. By the next morning he felt a lot better. He called and spoke to one of our nurses and I approved doxycycline over the phone thinking that this was in relation to the wounds we had previously seen  although they were definitely were not. The patient feels a lot better old fever no chills he is still working. Blood sugars are reasonably controlled 05/13/16; patient is back in for review of his cellulitis on his anterior medial upper thigh. He is taking doxycycline this is a lot better. Culture I did of the nodular area on the dorsal aspect of his foot grew MRSA this also looks a lot better. 05/20/16; the patient is cellulitis on the medial upper thigh has resolved. All of his wound areas including the left anterior foot, areas on the medial aspect of the left calf and the lateral aspect of the calf at all resolved. He has a new blister on the left dorsal foot at the level of the fourth toe this was excised. No evidence of infection 05/27/16; patient continues to complain weeping edema. He has new blisterlike wounds on  the left anterior lateral and posterior lateral calf at the top of his wrap levels. The area on his left anterior foot appears better. He is not complaining of fever, pain or pruritus in his feet. 05/30/16; the patient's blisters on his left anterior leg posterior calf all look improved. He did not increase the Lasix 100 mg as I suggested because he was going to run out of his 40 mg tablets. He is still having weeping edema of his toes 06/03/16; I renewed his Lasix at 80 mg once a day as he was about to run out when I last saw him. He is on 80 mg of Lasix now. I have asked him to cut down on the excessive amount of water he was drinking and asked him to drink according to his thirst mechanisms 06/12/2016 -- was seen 2 days ago and was supposed to wear his compression stockings at home but he is developed lymphedema and superficial blisters on the left lower extremity and hence came in for a review 06/24/16; the remaining wound is on his left anterior leg. He still has edema coming from between his toes. There is lymphedema here however his edema is generally better than when I last saw  this. He has a history of atrial fibrillation but does not have a known history of congestive heart failure nevertheless I think he probably has this at least on a diastolic basis. 07/01/16 I reviewed his echocardiogram from January 2017. This was essentially normal. He did not have LVH, EF of 55-60%. His right ventricular function was normal although he did have trivial tricuspid and pulmonic regurgitation. This is not audible on exam however. I increased his Lasix to do massive edema in his legs well above his knees I think in early July. He was also drinking an excessive amount of water at the time. 07/15/16; missed his appointment last week because of the Labor Day holiday on Monday. He could not get another appointment later in the week. Started to feel the wrap digging in superiorly so we remove the top half and the bottom half of his wrap. He has extensive erythema and blistering superiorly in the left leg. Very tender. Very swollen. Edema in his foot with leaking edema fluid. He has not been systemically unwell 07/22/16; the area on the left leg laterally required some debridement. The medial wounds look more stable. His wrap injury wounds appear to have healed. Edema and his foot is better, weeping edema is also better. He tells me he is meeting with the supplier of the external compression pumps at work 08/05/16; the patient was on vacation last week in [facility]. His wrap is been on for an extended period of time. Also over the weekend he developed an extensive area of tender erythema across his anterior medial thigh. He took to doxycycline yesterday that he had leftover from a previous prescription. The patient complains of weeping edema coming out of his toes 08/08/16; I saw this patient on 10/2. He was tender across his anterior thigh. I put him on doxycycline. He returns today in follow-up. He does not have any open wounds on his lower leg, he still has edema weeping into his  toes. 08/12/16; patient was seen back urgently today to follow-up for his extensive left thigh cellulitis/erysipelas. He comes back with a lot less swelling and erythema pain is much better. I believe I gave him Augmentin and Cipro. His wrap was cut down as he stated a roll down his legs.  He developed blistering above the level of the wrap that remained. He has 2 open blisters and 1 intact. 08/19/16; patient is been doing his primary doctor who is increased his Lasix from 40-80 once a day or 80 already has less edema. Cellulitis has remained improved in the left thigh. 2 open areas on the posterior left calf 08/26/16; he returns today having new open blisters on the anterior part of his left leg. He has his compression pumps but is not yet been shown how to use some vital representative from the supplier. 09/02/16 patient returns today with no open wounds on the left leg. Some maceration in his plantar toes 09/10/2016 -- Dr. Leanord Hawking had recently discharged him on 09/02/2016 and he has come right back with redness swelling and some open ulcers on his left lower extremity. He says this was caused by trying to apply his compression stockings and he's been unable to use this and has not been able to use his lymphedema pumps. He had some doxycycline leftover and he has started on this a few days ago. 09/16/16; there are no open wounds on his leg on the left and no evidence of cellulitis. 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 He does not complain of symptoms here. I am not clear use using his external compression pumps. 09/23/16; I have not seen this patient in 2 weeks. He canceled his appointment 10 days ago as he was going on vacation. He tells me that on Monday he noticed a large area on his posterior left leg which is been draining copiously and is reopened into a large wound. He is been using ABDs and the external part of his juxtalite,  according to our nurse this was not on properly. 10/07/16; Still a substantial area on the posterior left leg. Using silver alginate 10/14/16; in general better although there is still open area which looks healthy. Still using silver alginate. He reminds me that this happen before he left for [facility]. T oday while he was showering in the morning. He had been using his juxtalite's 10/21/16; the area on his posterior left leg is fully epithelialized. However he arrives today with a large area of tender erythema in his medial and posterior left thigh just above the knee. I have marked the area. Once again he is reluctant to consider hospitalization. I treated him with oral antibiotics in the past for a similar situation with resolution I think with doxycycline however this area it seems more extensive to me. He is not complaining of fever but does have chills and says states he is thirsty. His blood sugar today was in the 140s at home 10/25/16 the area on his posterior left leg is fully epithelialized although there is still some weeping edema. The large area of tenderness and erythema in his medial and posterior left thigh is a lot less tender although there is still a lot of swelling in this thigh. He states he feels a lot better. He is on doxycycline and Augmentin that I started last week. This will continued until Tuesday, December 26. I have ordered a duplex ultrasound of the left thigh rule out DVT whether there is an abscess something that would need to be drained I would also like to know. 11/01/16; he still has weeping edema from a not fully epithelialized area on his left posterior calf. Most of the rest of this looks a lot better. He has completed his  antibiotics. His thigh is a lot better. Duplex ultrasound did not show a DVT in the thigh 11/08/16; he comes in today with more Denuded surface epithelium from the posterior aspect of his calf. There is no real evidence of cellulitis. The  superior aspect of his wrap appears to have put quite an indentation in his leg just below the knee and this may have contributed. He does not complain of pain or fever. We have been using silver alginate as the primary dressing. The area of cellulitis in the right thigh has totally resolved. He has been using his compression stockings once a week 11/15/16; the patient arrives today with more loss of epithelium from the posterior aspect of his left calf. He now has a fairly substantial wound in this area. The reason behind this deterioration isn't exactly clear although his edema is not well controlled. He states he feels he is generally more swollen systemically. He is not complaining of chest pain shortness of breath fever. T me he has an appointment with his primary physician in early February. He is on 80 mg of oral ells Lasix a day. He claims compliance with the external compression pumps. He is not having any pain in his legs similar to what he has with his recurrent cellulitis 11/22/16; the patient arrives a follow-up of his large area on his left lateral calf. This looks somewhat better today. He came in earlier in the week for a dressing change since I saw him a week ago. He is not complaining of any pain no shortness of breath no chest pain 11/28/16; the patient arrives for follow-up of his large area on the left lateral calf this does not look better. In fact it is larger weeping edema. The surface of the wound does not look too bad. We have been using silver alginate although I'm not certain that this is a dressing issue. 12/05/16; again the patient follows up for a large wound on the left lateral and left posterior calf this does not look better. There continues to be weeping edema necrotic surface tissue. More worrisome than this once again there is erythema below the wound involving the distal Achilles and heel suggestive of cellulitis. He is on his feet working most of the day of this is  not going well. We are changing his dressing twice a week to facilitate the drainage. 12/12/16; not much change in the overall dimensions of the large area on the left posterior calf. This is very inflamed looking. I gave him an. Doxycycline last week does not really seem to have helped. He found the wrap very painful indeed it seems to of dog into his legs superiorly and perhaps around the heel. He came in early today because the drainage had soaked through his dressings. 12/19/16- patient arrives for follow-up evaluation of his left lower extremity ulcers. He states that he is using his lymphedema pumps once daily when there is "no drainage". He admits to not using his lipedema pumps while under current treatment. His blood sugars have been consistently between 150-200. 12/26/16; the patient is not using his compression pumps at home because of the wetness on his feet. I've advised him that I think it's important for him to use this daily. He finds his feet too wet, he can put a plastic bag over his legs while he is in the pumps. Otherwise I think will be in a vicious circle. We are using silver alginate to the major area on his left posterior  calf 01/02/17; the patient's posterior left leg has further of all into 3 open wounds. All of them covered with a necrotic surface. He claims to be using his compression pumps once a day. His edema control is marginal. Continue with silver alginate 01/10/17; the patient's left posterior leg actually looks somewhat better. There is less edema, less erythema. Still has 3 open areas covered with a necrotic surface requiring debridement. He claims to be using his compression pumps once a day his edema control is better 01/17/17; the patient's left posterior calf look better last week when I saw him and his wrap was changed 2 days ago. He has noted increasing pain in the left heel and arrives today with much larger wounds extensive erythema extending down into the entire  heel area especially tender medially. He is not systemically unwell CBGs have been controlled no fever. Our intake nurse showed me limegreen drainage on his AVD pads. 01/24/17; his usual this patient responds nicely to antibiotics last week giving him Levaquin for presumed Pseudomonas. The whole entire posterior part of his leg is much better much less inflamed and in the case of his Achilles heel area much less tender. He has also had some epithelialization posteriorly there are still open areas here and still draining but overall considerably better 01/31/17- He has continue to tolerate the compression wraps. he states that he continues to use the lymphedema pumps daily, and can increase to twice daily on the weekends. He is voicing no complaints or concerns regarding his LLE ulcers 02/07/17-he is here for follow-up evaluation. He states that he noted some erythema to the left medial and anterior thigh, which he states is new as of yesterday. He is concerned about recurrent cellulitis. He states his blood sugars have been slightly elevated, this morning in the 180s 02/14/17; he is here for follow-up evaluation. When he was last here there was erythema superiorly from his posterior wound in his anterior thigh. He was prescribed Levaquin however a culture of the wound surface grew MRSA over the phone I changed him to doxycycline on Monday and things seem to be a lot better. 02/24/17; patient missed his appointment on Friday therefore we changed his nurse visit into a physician visit today. Still using silver alginate on the large area of the posterior left thigh. He isn't new area on the dorsal left second toe 03/03/17; actually better today although he admits he has not used his external compression pumps in the last 2 days or so because of work responsibilities over the weekend. 03/10/17; continued improvement. External compression pumps once a day almost all of his wounds have closed on the posterior left  calf. Better edema control 03/17/17; in general improved. He still has 3 small open areas on the lateral aspect of his left leg however most of the area on the posterior part of his leg is epithelialized. He has better edema control. He has an ABD pad under his stocking on the right anterior lower leg although he did not let us look at that today. 03/24/17; patient arrives back in clinic today with no open areas however there are areas on the posterior left calf and anterior left calf that are less than 100% epithelialized. His edema is well controlled in the left lower leg. There is some pitting edema probably lymphedema in the left upper thigh. He uses compression pumps at home once per day. I tried to get him to do this twice a day although he is very reticent.  04/01/2017 -- for the last 2 days he's had significant redness, tenderness and weeping and came in for an urgent visit today. 04/07/17; patient still has 6 more days of doxycycline. He was seen by Dr. Meyer Russel last Wednesday for cellulitis involving the posterior aspect, lateral aspect of his Involving his heel. For the most part he is better there is less erythema and less weeping. He has been on his feet for 12 hours o2 over the weekend. Using his compression pumps once a day 04/14/17 arrives today with continued improvement. Only one area on the posterior left calf that is not fully epithelialized. He has intense bilateral venous inflammation associated with his chronic venous insufficiency disease and secondary lymphedema. We have been using silver alginate to the left posterior calf wound In passing he tells [place] today that the right leg but we have not seen in quite some time has an open area on it but he doesn't want [place] to look at this today states he will show this to [place] next week. 04/21/17; there is no open area on his left leg although he still reports some weeping edema. He showed [place] his right leg today which is the first time we've  seen this leg in a long time. He has a large area of open wound on the right leg anteriorly healthy granulation. Quite a bit of swelling in the right leg and some degree of venous inflammation. He told us about the right leg in passing last week but states that deterioration in the right leg really only happened over the weekend 04/28/17; there is no open area on the left leg although there is an irritated part on the posterior which is like a wrap injury. The wound on the right leg which was new from last week at least to [place] is a lot better. 05/05/17; still no open area on the left leg. Patient is using his new compression stocking which seems to be doing a good job of controlling the edema. He states he is using his compression pumps once per day. The right leg still has an open wound although it is better in terms of surface area. Required debridement. A lot of pain in the posterior right Achilles marked tenderness. Usually this type of presentation this patient gives concern for an active cellulitis 05/12/17; patient arrives today with his major wound from last week on the right lateral leg somewhat better. Still requiring debridement. He was using his compression stocking on the left leg however that is reopened with superficial wounds anteriorly he did not have an open wound on this leg previously. He is still using his juxta light's once daily at night. He cannot find the time to do this in the morning as he has to be at work by 7 AM 05/19/17; right lateral leg wound looks improved. No debridement required. The concerning area is on the left posterior leg which appears to almost have a subcutaneous hemorrhagic component to it. We've been using silver alginate to all the wounds 05/26/17; the right lateral leg wound continues to look improved. However the area on the left posterior calf is a tightly adherent surface. [place] using silver alginate. Because of the weeping edema in his legs there is very  little good alternatives. 06/02/17; the patient left here last week looking quite good. Major wound on the left posterior calf and a small one on the right lateral calf. Both of these look satisfactory. He tells me that by Wednesday he had noted  increased pain in the left leg and drainage. He called on Thursday and Friday to get an appointment here but we were blocked. He did not go to urgent care or his primary physician. He thinks he had a fever on Thursday but did not actually take his temperature. He has not been using his compression pumps on the left leg because of pain. I advised him to go to the emergency room today for IV antibiotics for stents of left leg cellulitis but he has refused I have asked him to take 2 days off work to keep his leg elevated and he has refused this as well. In view of this I'm going to call him and Augmentin and doxycycline. 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 However after a detailed discussion regarding the risk benefits and alternatives he refuses to get admitted to the hospital. With no other recourse I will continue him on oral antibiotics as before and hopefully he'll have his infectious disease consultation this week. 06/16/2017 -- the patient was seen today by the nurse practitioner at infectious disease Ms. Dixon. 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 He has also been advise Lotrimin cream locally. she also had wise IV antibiotics for his condition if it flares up 06/23/17; patient arrives today with drainage bilaterally although  the remaining wound on the left posterior calf after cleaning up today "highlighter yellow drainage" did not look too bad. Unfortunately he has had breakdown on the right anterior leg [previously this leg had not been open and he is using a black stocking] he went to see infectious disease and is been put on clindamycin 150 mg daily, I did not verify the dose although I'm not familiar with using clindamycin in this dosing range, perhaps for prophylaxisoo 06/27/17; I brought this patient back today to follow-up on the wound deterioration on the right lower leg together with surrounding cellulitis. I started him on doxycycline 4 days ago. This area looks better however he comes in today with intense cellulitis on the medial part of his left thigh. This is not have a wound in this area. Extremely tender. We've been using silver alginate to the wounds on the right lower leg left lower leg with bilateral 4 layer compression he is using his external compression pumps once a day 07/04/17; patient's left medial thigh cellulitis looks better. He has not been using his compression pumps as his insert said it was contraindicated with cellulitis. His right leg continues to make improvements all the wounds are still open. We only have one remaining wound on the left posterior calf. Using silver alginate to all open areas. He is on doxycycline which I started a week ago and should be finishing I gave him Augmentin after Thursday's visit for the severe cellulitis on the left medial thigh which fortunately looks better 07/14/17; the patient's left medial thigh cellulitis has resolved. The cellulitis in his right lower calf on the right also looks better. All of his wounds are stable to improved we've been using silver alginate he has completed the  antibiotics I have given him. He has clindamycin 150 mg once a day prescribed by infectious disease for prophylaxis, I've advised him to start this now. We have been using  bilateral Unna boots over silver alginate to the wound areas 07/21/17; the patient is been to see infectious disease who noted his recurrent problems with cellulitis. He was not able to tolerate prophylactic clindamycin therefore he is on amoxicillin 500 twice a day. He also had a second daily dose of Lasix added By Dr. Oneta Rack but he is not taking this. Nor is he being completely compliant with his compression pumps a especially not this week. He has 2 remaining wounds one on the right posterior lateral lower leg and one on the left posterior medial lower leg. 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 The patient has Unna boots bilaterally. 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 He has bilateral Unna boots for compression and silver alginate to his wounds. Arrives today with his legs looking as good as I have seen him in quite some time. Not surprisingly his wounds look better as well with improvement on the right lateral leg venous insufficiency wound and also the left medial leg. He is still using the compression pumps once a day 08/11/17; both legs appear to be doing better wounds on the right lateral and left medial legs look better. Skin on the right leg quite good. He is been using silver alginate as the primary dressing. I'm going to use Anasept gel calcium alginate and maintain all the secondary dressings 08/18/17; the patient continues to actually do quite well. The area on his right lateral leg is just about closed the left medial also looks better although it is still moist in this area. His edema is well controlled we have been using Anasept gel with calcium alginate and the usual secondary dressings, 4 layer compression and once daily use of his compression pumps  "always been able to manage 09/01/17; the patient continues to do reasonably well in spite of his trip to [place]. The area on the right lateral leg is epithelialized. Left is much better but still open. He has more edema and more chronic erythema on the left leg [venous inflammation] 09/08/17; he arrives today with no open wound on the right lateral leg and decently controlled edema. Unfortunately his left leg is not nearly as in his good situation as last week.he apparently had increasing edema starting on Saturday. He edema soaked through into his foot so used a plastic bag to walk around his home. The area on the medial right leg which was his open area is about the same however he has lost surface epithelium on the left lateral which is new and he has significant pain in the Achilles area of the left foot. He is already on amoxicillin chronically for prophylaxis of cellulitis in the left leg 09/15/17; he is completed a week of doxycycline and the cellulitis in the left posterior leg and Achilles area is as usual improved. He still has a lot of edema and fluid soaking through his dressings. There is no open wound on the right leg. He saw infectious disease NP today 09/22/17;As usual 1 we transition him from our compression wraps to his stockings things did not go well. He has  several small open areas on the right leg. He states this was caused by the compression wrap on his skin although he did not wear this with the stockings over them. He has several superficial areas on the left leg medially laterally posteriorly. He does not have any evidence of active cellulitis especially involving the left Achilles The patient is traveling from [facility] Saturday going to [facility]. He states he isn't attempting to get an appointment with a heel objects wound center there to change his dressings. I am not completely certain whether this will work 10/06/17; the patient came in on Friday for a nurse visit  and the nurse reported that his legs actually look quite good. He arrives in clinic today for his regular follow-up visit. He has a new wound on his left third toe over the PIP probably caused by friction with his footwear. He has small areas on the left leg and a very superficial but epithelialized area on the right anterior lateral lower leg. Other than that his legs look as good as I've seen him in quite some time. We have been using silver alginate Review of systems; no chest pain no shortness of breath other than this a 10 point review of systems negative 10/20/17; seen by Dr. Meyer Russel last week. He had taken some antibiotics [doxycycline] that he had left over. Dr. Meyer Russel thought he had candida infection and declined to give him further antibiotics. He has a small wound remaining on the right lateral leg several areas on the left leg including a larger area on the left posterior several left medial and anterior and a small wound on the left lateral. The area on the left dorsal third toe looks a lot better. ROS; Gen.; no fever, respiratory no cough no sputum Cardiac no chest pain other than this 10 point review of system is negative 10/30/17; patient arrives today having fallen in the bathtub 3 days ago. It took him a while to get up. He has pain and maceration in the wounds on his left leg which have deteriorated. He has not been using his pumps he also has some maceration on the right lateral leg. 11/03/17; patient continues to have weeping edema especially in the left leg. This saturates his dressings which were just put on on 12/27. As usual the doxycycline seems to take care of the cellulitis on his lower leg. He is not complaining of fever, chills, or other systemic symptoms. He states his leg feels a lot better on the doxycycline I gave him empirically. He also apparently gets injections at his primary doctor's officeo Rocephin for cellulitis prophylaxis. I didn't ask him about his  compression pump compliance today I think that's probably marginal. Arrives in the clinic with all of his dressings primary and secondary macerated full of fluid and he has bilateral edema 11/10/17; the patient's right leg looks some better although there is still a cluster of wounds on the right lateral. The left leg is inflamed with almost circumferential skin loss medially to laterally although we are still maintaining anteriorly. He does not have overt cellulitis there is a lot of drainage. He is not using compression pumps. We have been using silver alginate to the wound areas, there are not a lot of options here 11/17/17; the patient's right leg continues to be stable although there is still open wounds, better than last week. The inflammation in the left leg is better. Still loss of surface layer epithelium especially posteriorly. There is no overt  cellulitis in the amount of edema and his left leg is really quite good, tells me he is using his compression pumps once a day. 11/24/17; patient's right leg has a small superficial wound laterally this continues to improve. The inflammation in the left leg is still improving however we have continuous surface layer epithelial loss posteriorly. There is no overt cellulitis in the amount of edema in both legs is really quite good. He states he is using his compression pumps on the left leg once a day for 5 out of 7 days 12/01/17; very small superficial areas on the right lateral leg continue to improve. Edema control in both legs is better today. He has continued loss of surface epithelialization and left posterior calf although I think this is better. We have been using silver alginate with large number of absorptive secondary dressings 4 layer on the left Unna boot on the right at his request. He tells me he is using his compression pumps once a day 12/08/17; he has no open area on the right leg is edema control is good here. ooOn the left leg however he  has marked erythema and tenderness breakdown of skin. He has what appears to be a wrap injury just distal to the popliteal fossa. 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 There was swelling erythema and breakdown in the right leg. He has much worse skin breakdown in the left leg as well multiple open areas medially and posteriorly as well as laterally. He tells me he has been using his compression pumps but tells me he feels that the drainage out of his leg is worse when he uses a compression pumps. T be fair to him he is been saying this o for a while however I don't know that I have really been listening to this. I wonder if the compression pumps are working properly 12/22/17;. Once again he arrives with severe erythema, weeping edema from the left greater than right leg. Noncompliance with compression pumps. New this visit he is complaining of pain on the lateral aspect of the right leg and the medial aspect of his right thigh. He apparently saw his cardiologist Dr. [name] who was ordered an echocardiogram area and I think this is a step in the right direction 12/25/17; started his doxycycline Monday night. There is still intense erythema of the right leg especially in the anterior thigh although there is less tenderness. The erythema around the wound on the right lateral calf also is less tender. He still complaining of pain in the left heel. His wounds are about the same right lateral left medial left lateral. Superficial but certainly not close to closure. He denies being systemically unwell no fever chills no abdominal pain no diarrhea 12/29/17; back in follow-up of his extensive right calf and right thigh cellulitis. I  added amoxicillin to cover possible doxycycline resistant strep. This seems to of done the trick he is in much less pain there is much less erythema and swelling. He has his echocardiogram at 11:00 this morning. X-ray of the left heel was also negative. 01/05/18; the patient arrived with his edema under much better control. Now that he is retired he is able to use his compression pumps  daily and sometimes twice a day per the patient. He has a wound on the right leg the lateral wound looks better. Area on the left leg also looks a lot better. He has no evidence of cellulitis in his bilateral thighs I had a quick peak at his echocardiogram. He is in normal ejection fraction and normal left ventricular function. He has moderate pulmonary hypertension moderately reduced right ventricular function. One would have to wonder about chronic sleep apnea although he says he doesn't snore. He'll review the echocardiogram with his cardiologist. 01/12/18; the patient arrives with the edema in both legs under exemplary control. He is using his compression pumps daily and sometimes twice daily. His wound on the right lateral leg is just about closed. He still has some weeping areas on the posterior left calf and lateral left calf although everything is just about closed here as well. I have spoken with Aldean Baker who is the patient's nurse practitioner and infectious disease. She was concerned that the patient had not understood that the parenteral penicillin injections he was receiving for cellulitis prophylaxis was actually benefiting him. I don't think the patient actually saw that I would tend to agree we were certainly dealing with less infections although he had a serious one last month. 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 He is healed. He'll be placed in juxtalite compression wraps and increase his lymphedema pumps to twice daily. We will follow up again next week  to ensure there are no issues with the new regiment. 01/20/18-he is here for evaluation of bilateral lower extremity weeping edema. Yesterday he was placed in compression wrap to the right lower extremity and compression stocking to left lower shrubbery. He states he uses lymphedema pumps last night and again this morning and noted a blister to the left lower extremity. On exam he was noted to have drainage to the right lower extremity. He will be placed in Unna boots bilaterally and follow-up next week 01/26/18; patient was actually discharged a week ago to his own juxta light stockings only to return the next day with bilateral lower extremity weeping edema.he was placed in bilateral Unna boots. He arrives today with pain in the back of his left leg. There is no open area on the right leg however there is a linear/wrap injury on the left leg and weeping edema on the left leg posteriorly. I spoke with infectious disease about 10 days ago. They were disappointed that the patient elected to discontinue prophylactic intramuscular penicillin shots as they felt it was particularly beneficial in reducing the frequency of his cellulitis. I discussed this with the patient today. He does not share this view. He'll definitely need antibiotics today. Finally he is traveling to [place] and trauma leaving this Saturday and returning a week later and he does not travel with his pumps. He is going by car 01/30/18; patient was seen 4 days ago and brought back in today for review of cellulitis in the left leg posteriorly. I put him on amoxicillin this really hasn't helped as much as I might like. He is also worried because he is traveling to [facility] trauma by car. Finally we will be rewrapping him. There is no open area on the right leg over his left leg has multiple weeping areas as usual 02/09/18; The same wrap on for 10 days. He did not pick up the last doxycycline I prescribed for him. He apparently took 4 days  worth he already  had. There is nothing open on his right leg and the edema control is really quite good. He's had damage in the left leg medially and laterally especially probably related to the prolonged use of Unna boots 02/12/18; the patient arrived in clinic today for a nurse visit/wrap change. He complained of a lot of pain in the left posterior calf. He is taking doxycycline that I previously prescribed for him. Unfortunately even though he used his stockings and apparently used to compression pumps twice a day he has weeping edema coming out of the lateral part of his right leg. This is coming from the lower anterior lateral skin area. 02/16/18; the patient has finished his doxycycline and will finish the amoxicillin 2 days. The area of cellulitis in the left calf posteriorly has resolved. He is no longer having any pain. He tells me he is using his compression pumps at least once a day sometimes twice. 02/23/18; the patient finished his doxycycline and Amoxil last week. On Friday he noticed a small erythematous circle about the size of a quarter on the left lower leg just above his ankle. This rapidly expanded and he now has erythema on the lateral and posterior part of the thigh. This is bright red. Also has an area on the dorsal foot just above his toes and a tender area just below the left popliteal fossa. He came off his prophylactic penicillin injections at his own insistence one or 2 months ago. This is obviously deteriorated since then 03/02/18; patient is on doxycycline and Amoxil. Culture I did last week of the weeping area on the back of his left calf grew group B strep. I have therefore renewed the amoxicillin 500 3 times a day for a further week. He has not been systemically unwell. Still complaining of an area of discomfort right under his left popliteal fossa. There is no open wound on the right leg. He tells me that he is using his pumps twice a day on most days 03/09/18; patient  arrives in clinic today completing his amoxicillin today. The cellulitis on his left leg is better. Furthermore he tells me that he had intramuscular penicillin shots that his primary care office today. However he also states that the wrap on his right leg fell down shortly after leaving clinic last week. He developed a large blister that was present when he came in for a nurse visit later in the week and then he developed intense discomfort around this area.He tells me he is using his compression pumps 03/16/18; the patient has completed his doxycycline. The infectious part of this/cellulitis in the left heel area left popliteal area is a lot better. He has 2 open areas on the right calf. Still areas on the left calf but this is a lot better as well. 03/24/18; the patient arrives complaining of pain in the left popliteal area again. He thinks some of this is wrap injury. He has no open area on the right leg and really no open area on the left calf either except for the popliteal area. He claims to be compliant with the compression pumps 03/31/18; I gave him doxycycline last week because of cellulitis in the left popliteal area. This is a lot better although the surface epithelium is denuded off and response to this. He arrives today with uncontrolled edema in the right calf area as well as a fingernail injury in the right lateral calf. There is only a few open areas on the left 04/06/18; I gave  him amoxicillin doxycycline over the last 2 weeks that the amoxicillin should be completing currently. He is not complaining of any pain or systemic symptoms. The only open areas see has is on the right lateral lower leg paradoxically I cannot see anything on the left lower leg. He tells me he is using his compression pumps twice a day on most days. Silver alginate to the wounds that are open under 4 layer compression 04/13/18; he completed antibiotics and has no new complaints. Using his compression pumps. Silver  alginate that anything that's opened 04/20/18; he is using his compression pumps religiously. Silver alginate 4 layer compression anything that's opened. He comes in today with no open wounds on the left leg but 3 on the right including a new one posteriorly. He has 2 on the right lateral and one on the right posterior. He likes Unna boots on the right leg for reasons that aren't really clear we had the usual 4 layer compression on the left. It may be necessary to move to the 4 layer compression on the right however for now I left them in the Unna boots 04/27/18; he is using his compression pumps at least once a day. He has still the wounds on the right lateral calf. The area right posteriorly has closed. He does not have an open wound on the left under 4 layer compression however on the dorsal left foot just proximal to the toes and the left third toe 2 small open areas were identified 05/11/18; he has not uses compression pumps. The areas on the right lateral calf have coalesced into one large wound necrotic surface. On the left side he has one small wound anteriorly however the edema is now weeping out of a large part of his left leg. He says he wasn't using his pumps because of the weeping fluid. I explained to him that this is the time he needs to pump more 05/18/18; patient states he is using his compression pumps twice a day. The area on the right lateral large wound albeit superficial. On the left side he has innumerable number of small new wounds on the left calf particularly laterally but several anteriorly and medially. All these appear to have healthy granulated base these look like the remnants of blisters however they occurred under compression. The patient arrives in clinic today with his legs somewhat better. There is certainly less edema, less multiple open areas on the left calf and the right anterior leg looks somewhat better as well superficial and a little smaller. However he relates  pain and erythema over the last 3-4 days in the thigh and I looked at this today. He has not been systemically unwell no fever no chills no change in blood sugar values 05/25/18; comes in today in a better state. The severe cellulitis on his left leg seems better with the Keflex. Not as tender. He has not been systemically unwell ooHard to find an open wound on the left lower leg using his compression pumps twice a day ooThe confluent wounds on his right lateral calf somewhat better looking. These will ultimately need debridement I didn't do this today. 06/01/18; the severe cellulitis on the left anterior thigh has resolved and he is completed his Keflex. ooThere is no open wound on the left leg however there is a superficial excoriation at the base of the third toe dorsally. Skin on the bottom of his left foot is macerated looking. ooThe left the wounds on the lateral right leg  actually looks some better although he did require debridement of the top half of this wound area with an open curet 06/09/18 on evaluation today patient appears to be doing poorly in regard to his right lower extremity in particular this appears to likely be infected he has very thick purulent discharge along with a bright green tent to the discharge. This makes me concerned about the possibility of pseudomonas. He's also having increased discomfort at this point on evaluation. Fortunately there does not appear to be any evidence of infection spreading to the other location at this time. 06/16/18 on evaluation today patient appears to actually be doing fairly well. His ulcer has actually diminished in size quite significantly at this point which is good news. Nonetheless he still does have some evidence of infection he did see infectious disease this morning before coming here for his appointment. I did review the results of their evaluation and their note today. They did actually have him discontinue the Cipro and initiate  treatment with linezolid at this time. He is doing this for the next seven days and they recommended a follow-up in four months with them. He is the keep a log of the need for intermittent antibiotic therapy between now and when he falls back up with infectious disease. This will help them gaze what exactly they need to do to try and help them out. 06/23/18; the patient arrives today with no open wounds on the left leg and left third toe healed. He is been using his compression pumps twice a day. On the right lateral leg he still has a sizable wound but this is a lot better than last time I saw this. 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 Has been using silver alginate under 4 layer compression 06/30/18; the only open wound he has is on the right lateral leg and this looks healthy. No debridement is required. We have been using silver alginate. He does not have an open wound on the left leg. There is apparently some drainage from the dorsal proximal third toe on the left although I see no open wound here. 07/03/18 on evaluation today patient was actually here just for a nurse visit rapid change. However when he was here on Wednesday for his rat change due to having been healed on the left and then developing blisters we initiated the wrap again knowing that he would be back today for [place] to reevaluate and see were at. Unfortunately he has developed some cellulitis into the proximal portion of his right lower extremity even into the region of his thigh. 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 He was placed on one as what at that point. Nonetheless he is done with that and has been tolerating it well otherwise. Doxycycline which in the past really did not seem to be effective for him. Nonetheless I think the best option may be for [place] to definitely  reinitiate the antibiotics for a longer period of time. 07/07/18; since I last saw this patient a week ago he has had a difficult time. At that point he did not have an open wound on his left leg. We transitioned him into juxta light stockings. He was apparently in the clinic the next day with blisters on the left lateral and left medial lower calf. He also had weeping edema fluid.  He was put back into a compression wrap. He was also in the clinic on Friday with intense erythema in his right thigh. Per the patient he was started on Bactrim however that didn't work at all in terms of relieving his pain and swelling. He has taken 3 doxycycline that he had left over from last time and that seems to of helped. He has blistering on the right thigh as well. 07/14/18; the erythema on his right thigh has gotten better with doxycycline that he is finishing. 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 Presumably this cellulitis in the thigh was not related to that although I think this is a bit concerning going forward. He still has an area on the right lateral calf the blister on the right medial calf just below the knee that was discussed above. On the left 2 small open areas left medial and left lateral. Edema control is adequate. He is using his compression pumps twice a day 07/20/18; continued improvement in the condition of both legs especially the edema in his bilateral thighs. He tells me he is been losing weight through a combination of diet and exercise. He is using his compression pumps twice a day. So overall she made to the remaining wounds 07/27/2018; continued improvement in condition of both legs. His edema is well controlled. The area on the right lateral leg is just about closed he had one blisters show up on the medial left upper calf. We have him in 4 layer compression. He is going on a 10-day trip to [place], [place]  and [place]. He will be driving. He wants to wear Unna boots because of the lessening amount of constriction. He will not use compression pumps while he is away 08/05/18 on evaluation today patient actually appears to be doing decently well all things considered in regard to his bilateral lower extremities. The worst ulcer is actually only posterior aspect of his left lower extremity with a four layer compression wrap cut into his leg a couple weeks back. He did have a trip and actually had [employer] for the trip that he is worn since he was last here. Nonetheless he feels like the [employer] actually do better for him his swelling is up a little bit but he also with his trip was not taking his Lasix on a regular set schedule like he was supposed to be. He states that obviously the reason being that he cannot drive and keep going without having to urinate too frequently which makes it difficult. He did not have his pumps with him while he was away either which I think also maybe playing a role here too. 08/13/2018; the patient only has a small open wound on the right lateral calf which is a big improvement in the last month or 2. He also has the area posteriorly just below the posterior fossa on the left which I think was a wrap injury from several weeks ago. He has no current evidence of cellulitis. He tells me he is back into his compression pumps twice a day. He also tells me that while he was at the laundromat somebody stole a section of his extremitease stockings 08/20/2018; back in the clinic with a much improved state. He only has small areas on the right lateral mid calf which is just about healed. This was is more substantial area for quite a  prolonged period of time. He has a small open area on the left anterior tibia. The area on the posterior calf just below the popliteal fossa is closed today. He is using his compression pumps twice a day 08/28/2018; patient has no open wound  on the right leg. He has a smattering of open areas on the calf with some weeping lymphedema. More problematically than that it looks as though his wraps of slipped down in his usual he has very angry upper area of edema just below the right medial knee and on the right lateral calf. He has no open area on his feet. The patient is traveling to [facility] next week. I will send him in an antibiotic. We will continue to wrap the right leg. We ordered extremitease stockings for him last week and I plan to transition the right leg to a stocking when he gets home which will be in 10 days time. As usual he is very reluctant to take his pumps with him when he travels 09/07/2018; patient returns from [facility]. He shows me a picture of his left leg in the mid part of his trip last week with intense fire engine erythema. The picture look bad enough I would have considered sending him to the hospital. Instead he went to the wound care center in [facility]. They did not prescribe him antibiotics but he did take some doxycycline he had leftover from a previous visit. I had given him trimethoprim sulfamethoxazole before he left this did not work according to the patient. This is resulted in some improvement fortunately. He comes back with a large wound on the left posterior calf. Smaller area on the left anterior tibia. Denuded blisters on the dorsal left foot over his toes. Does not have much in the way of wounds on the right leg although he does have a very tender area on the right posterior area just below the popliteal fossa also suggestive of infection. He promises me he is back on his pumps twice a day 09/15/2018; the intense cellulitis in his left lower calf is a lot better. The wound area on the posterior left calf is also so better. However he has reasonably extensive wounds on the dorsal aspect of his second and third toes and the proximal foot just at the base of the toes. There is  nothing open on the right leg 09/22/2018; the patient has excellent edema control in his legs bilaterally. He is using his external compression pumps twice a day. He has no open area on the right leg and only the areas in the left foot dorsally second and third toe area on the left side. He does not have any signs of active cellulitis. 10/06/2018; the patient has good edema control bilaterally. He has no open wound on the right leg. There is a blister in the posterior aspect of his left calf that we had to deal with today. He is using his compression pumps twice a day. There is no signs of active cellulitis. We have been using silver alginate to the wound areas. He still has vulnerable areas on the base of his left first second toes dorsally He has a his extremities stockings and we are going to transition him today into the stocking on the right leg. He is cautioned that he will need to continue to use the compression pumps twice a day. If he notices uncontrolled edema in the right leg he may need to go to 3 times  a day. 10/13/2018; the patient came in for a nurse check on Friday he has a large flaccid blister on the right medial calf just below the knee. We unroofed this. He has this and a new area underneath the posterior mid calf which was undoubtedly a blister as well. He also has several small areas on the right which is the area we put his extremities stocking on. 10/19/2018; the patient went to see infectious disease this morning I am not sure if that was a routine follow-up in any case the doxycycline I had given him was discontinued and started on linezolid. He has not started this. It is easy to look at his left calf and the inflammation and think this is cellulitis however he is very tender in the tissue just below the popliteal fossa and I have no doubt that there is infection going on here. He states the problem he is having is that with the compression pumps the edema goes down and then  starts walking the wrap falls down. We will see if we can adhere this. He has 1 or 2 minuscule open areas on the right still areas that are weeping on the posterior left calf, the base of his left second and third toes 10/26/18; back today in clinic with quite of skin breakdown in his left anterior leg. This may have been infection the area below the popliteal fossa seems a lot better however tremendous epithelial loss on the left anterior mid tibia area over quite inexpensive tissue. He has 2 blisters on the right side but no other open wound here. 10/29/2018; came in urgently to see [place] today and we worked him in for review. He states that the 4 layer compression on the right leg caused pain he had to cut it down to roughly his mid calf this caused swelling above the wrap and he has blisters and skin breakdown today. As a result of the pain he has not been using his pumps. Both legs are a lot more edematous and there is a lot of weeping fluid. 11/02/18; arrives in clinic with continued difficulties in the right leg> left. Leg is swollen and painful. multiple skin blisters and new open areas especially laterally. He has not been using his pumps on the right leg. 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 He is not systemically unwell. 11/09/2018; the patient claims he is being compliant with his pumps. He is finished the doxycycline I gave him last week. Culture I did of the wound on the right lateral leg showed a few very resistant methicillin staph aureus. This was resistant to doxycycline. Nevertheless he states the pain in the leg is a lot better which makes me wonder if the cultured organism was not really what was causing the problem nevertheless this is a very dangerous organism to be culturing out of any wound. His right leg is still a lot larger than the left. He is using an Radio broadcast assistant on this area, he blames a 4-layer compression for causing the  original skin breakdown which I doubt is true however I cannot talk him out of it. We have been using silver alginate to all of these areas which were initially blisters 11/16/2018; patient is being compliant with his external compression pumps at twice a day. Miraculously he arrives in clinic today with absolutely no open wounds. He has better edema control on the left where he has been using 4 layer compression versus  wound of wounds on the right and I pointed this out to him. There is no inflammation in the skin in his lower legs which is also somewhat unusual for him. There is no open wounds on the dorsal left foot. He has extremitease stockings at home and I have asked him to bring these in next week. 11/25/18 patient's lower extremity on examination today on the left appears for the most part to be wound free. He does have an open wound on the lateral aspect of the right lower extremity but this is minimal compared to what I've seen in past. He does request that we go ahead and wrap the left leg as well even though there's nothing open just so hopefully it will not reopen in short order. 1/28; patient has superficial open wounds on the right lateral calf left anterior calf and left posterior calf. His edema control is adequate. 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 We have been using silver alginate as the primary dressing. He claims compliance with his compression pumps 2/4; patient has superficial open wounds on numerous areas of his left calf and again one on the left dorsal foot. The areas on the right lateral calf have healed. The cellulitis that I gave him doxycycline for last week is also resolved this was mostly on the left anterior calf just below the tibial tuberosity. His edema looks fairly well-controlled. He tells me he went to see his primary doctor today and had blood work ordered 2/11; once  again he has several open areas on the left calf left tibial area. Most of these are small and appear to have healthy granulation. He does not have anything open on the right. The edema and control in his thighs is pretty good which is usually a good indication he has been using his pumps as requested. 2/18; he continues to have several small areas on the left calf and left tibial area. Most of these are small healthy granulation. We put him in his stocking on the right leg last week and he arrives with a superficial open area over the right upper tibia and a fairly large area on the right lateral tibia in similar condition. His edema control actually does not look too bad, he claims to be using his compression pumps twice a day 2/25. Continued small areas on the left calf and left tibial area. New areas especially on the right are identified just below the tibial tuberosity and on the right upper tibia itself. There are also areas of weeping edema fluid even without an obvious wound. He does not have a considerable degree of lymphedema but clearly there is more edema here than his skin can handle. He states he is using the pumps twice a day. We have an Unna boot on the right and 4 layer compression on the left. 3/3; he continues to have an area on the right lateral calf and right posterior calf just below the popliteal fossa. There is a fair amount of tenderness around the wound on the popliteal fossa but I did not see any evidence of cellulitis, could just be that the wrap came down and rubbed in this area. ooHe does not have an open area on the left leg however there is an area on the left dorsal foot at the base of the third toe ooWe have been using silver alginate to all wound areas 3/10; he  did not have an open area on his left leg last time he was here a week ago. T oday he arrives with a horizontal wound just below the tibial tuberosity and an area on the left lateral calf. He has intense  erythema and tenderness in this area. The area is on the right lateral calf and right posterior calf better than last week. We have been using silver alginate as usual 3/18 - Patient returns with 3 small open areas on left calf, and 1 small open area on right calf, the skin looks ok with no significant erythema, he continues the UNA boot on right and 4 layer compression on left. The right lateral calf wound is closed , the right posterior is small area. we will continue silver alginate to the areas. 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 He is been tolerating the dressing changes without complication. Fortunately she has made excellent improvement in regard to the overall status of his wounds. Unfortunately every time we cease wrapping him he ends up reopening in causing more significant issues at that point. Again I'm unsure of the best direction to take although I think the lymphedema clinic may be appropriate for him. 02/03/19 on evaluation today patient appears to be doing well in regard to the wounds that we saw him for last week unfortunately he has a new area on the proximal portion of his right medial/posterior lower extremity where the wrap somewhat slowed down and caused swelling and a blister to rub and open. Unfortunately this is the only opening that he has on either leg at this point. 02/17/19 on evaluation today patient's bilateral lower extremities appear to be doing well. He still completely healed in regard to the left lower extremity. In regard to the right lower extremity the area where the wrap and slid down and caused the blister still seems to be slightly open although this is dramatically better than during the last evaluation two weeks ago. I'm very pleased with the way this stands  overall. 03/03/19 on evaluation today patient appears to be doing well in regard to his right lower extremity in general although he did have a new blister open this does not appear to be showing any evidence of active infection at this time. Fortunately there's No fevers, chills, nausea, or vomiting noted at this time. Overall I feel like he is making good progress it does feel like that the right leg will we perform the [employer] seems to do with a bit better than three layer wrap on the left which slid down on him. We may switch to doing bilateral in the book wraps. 5/4; I have not seen Mr. Laffoon in quite some time. According to our case manager he did not have an open wound on his left leg last week. He had 1 remaining wound on the right posterior medial calf. He arrives today with multiple openings on the left leg probably were blisters and/or wrap injuries from Unna boots. I do not think the Unna boot's will provide adequate compression on the left. I am also not clear about the frequency he is using the compression pumps. 03/17/19 on evaluation today patient appears to be doing excellent in regard to his lower extremities compared to last week's evaluation apparently. He had gotten significantly worse last  week which is unfortunate. The [employer] wrap on the left did not seem to do very well for him at all and in fact it didn't control his swelling significantly enough he had an additional outbreak. Subsequently we go back to the four layer compression wrap on the left. This is good news. At least in that he is doing better and the wound seem to be killing him. He still has not heard anything from the lymphedema clinic. 03/24/19 on evaluation today patient actually appears to be doing much better in regard to his bilateral lower Trinity as compared to last week when I saw him. Fortunately there's no signs of active infection at this time. He has been tolerating the dressing changes without  complication. Overall I'm extremely pleased with the progress and appearance in general. 04/07/19 on evaluation today patient appears to be doing well in regard to his bilateral lower extremities. His swelling is significantly down from where it was previous. With that being said he does have a couple blisters still open at this point but fortunately nothing that seems to be too severe and again the majority of the larger openings has healed at this time. 04/14/19 on evaluation today patient actually appears to be doing quite well in regard to his bilateral lower extremities in fact I'm not even sure there's anything significantly open at this time at any site. Nonetheless he did have some trouble with these wraps where they are somewhat irritating him secondary to the fact that he has noted that the graph wasn't too close down to the end of this foot in a little bit short as well up to his knee. Otherwise things seem to be doing quite well. 04/21/19 upon evaluation today patient's wound bed actually showed evidence of being completely healed in regard to both lower extremities which is excellent news. There does not appear to be any signs of active infection which is also good news. I'm very pleased in this regard. No fevers, chills, nausea, or vomiting noted at this time. 04/28/19 on evaluation today patient appears to be doing a little bit worse in regard to both lower extremities on the left mainly due to the fact that when he went infection disease the wrap was not wrapped quite high enough he developed a blister above this. On the right he is a small open area of nothing too significant but again this is continuing to give him some trouble he has been were in the Velcro compression that he has at home. 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 He's been tolerating the dressing changes without complication. Fortunately there's no signs of  active infection at this time. No fevers, chills, nausea, or vomiting noted at this time. We have been trying to get an appointment with her lymphedema clinic in [facility] but unfortunately nobody can get them on phone with not been able to even fax information over the patient likewise is not been able to get in touch with them. Overall I'm not sure exactly what's going on here with to reach out again today. 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 Still having a lot of drainage unfortunately. He tells me especially in the left but even on the right. There's no signs of active infection which is good news we've been using so ratcheted up to this point. 05/19/19 on  evaluation today patient actually appears to be doing quite well with regard to his left lower extremity which is great news. Fortunately in regard to the right lower extremity has an issues with his wrap and he subsequently did remove this from what I'm understanding. Nonetheless long story short is what he had rewrapped once he removed it subsequently had maggots underneath this wrap whenever he came in for evaluation today. With that being said they were obviously completely cleaned away by the nursing staff. The visit today which is excellent news. However he does appear to potentially have some infection around the right ankle region where the maggots were located as well. He will likely require anabiotic therapy today. 05/26/19 on evaluation today patient actually appears to be doing much better in regard to his bilateral lower extremities. I feel like the infection is under much better control. With that being said there were maggots noted when the wrap was removed yet again today. Again this could have potentially been left over from previous although at this time there does not appear to be any signs of significant drainage there  was obviously on the wrap some drainage as well this contracted gnats or otherwise. Either way I do not see anything that appears to be doing worse in my pinion and in fact I think his drainage has slowed down quite significantly likely mainly due to the fact to his infection being under better control. 06/02/2019 on evaluation today patient actually appears to be doing well with regard to his bilateral lower extremities there is no signs of active infection at this time which is great news. With that being said he does have several open areas more so on the right than the left but nonetheless these are all significantly better than previously noted. 06/09/2019 on evaluation today patient actually appears to be doing well. His wrap stayed up and he did not cause any problems he had more drainage on the right compared to the left but overall I do not see any major issues at this time which is great news. 06/16/2019 on evaluation today patient appears to be doing excellent with regard to his lower extremities the only area that is open is a new blister that can have opened as of today on the medial ankle on the left. Other than this he really seems to be doing great I see no major issues at this point. 06/23/2019 on evaluation today patient appears to be doing quite well with regard to his bilateral lower extremities. In fact he actually appears to be almost completely healed there is a small area of weeping noted of the right lower extremity just above the ankle. Nonetheless fortunately there is no signs of active infection at this time which is good news. No fevers, chills, nausea, vomiting, or diarrhea. 8/24; the patient arrived for a nurse visit today but complained of very significant pain in the left leg and therefore I was asked to look at this. Noted that he did not have an open area on the left leg last week nevertheless this was wrapped. The patient states that he is not been able to put his  compression pumps on the left leg because of the discomfort. He has not been systemically unwell 06/30/2019 on evaluation today patient unfortunately despite being excellent last week is doing much worse with regard to his left lower extremity today. In fact he had to come in for a nurse on Monday where his left leg had to be  rewrapped due to excessive weeping Dr. Leanord Hawking placed him on doxycycline at that point. Fortunately there is no signs of active infection Systemically at this time which is good news. 07/07/2019 in regard to the patient's wounds today he actually seems to be doing well with his right lower extremity there really is nothing open or draining at this point this is great news. Unfortunately the left lower extremity is given him additional trouble at this time. There does not appear to be any signs of active infection nonetheless he does have a lot of edema and swelling noted at this point as well as blistering all of which has led to a much more poor appearing leg at this time compared to where it was 2 weeks ago when it was almost completely healed. Obviously this is a little discouraging for the patient. He is try to contact the lymphedema clinic in [place] he has not been able to get through to them. 07/14/2019 on evaluation today patient actually appears to be doing slightly better with regard to his left lower extremity ulcers. Overall I do feel like at least at the top of the wrap that we have been placing this area has healed quite nicely and looks much better. The remainder of the leg is showing signs of improvement. Unfortunately in the thigh area he still has an open region on the left and again on the right he has been utilizing just a Band-Aid on an area that also opened on the thigh. Again this is an area that were not able to wrap although we did do an Ace wrap to provide some compression that something that obviously is a little less effective than the compression wraps  we have been using on the lower portion of the leg. He does have an appointment with the lymphedema clinic in [facility] on Friday. 07/21/2019 on evaluation today patient appears to be doing better with regard to his lower extremity ulcers. He has been tolerating the dressing changes without complication. Fortunately there is no signs of active infection at this time. No fevers, chills, nausea, vomiting, or diarrhea. I did receive the paperwork from the physical therapist at the lymphedema clinic in [place]. Subsequently I signed off on that this morning and sent that back to him for further progression with the treatment plan. 07/28/2019 on evaluation today patient appears to be doing very well with regard to his right lower extremity where I do not see any open wounds at this point. Fortunately he is feeling great as far as that is concerned as well. In regard to the left lower extremity he has been having issues with still several areas of weeping and edema although the upper leg is doing better his lower leg still I think is going require the compression wrap at this time. No fevers, chills, nausea, vomiting, or diarrhea. 08/04/2019 on evaluation today patient unfortunately is having new wounds on the right lower extremity. Again we have been using Unna boot wrap on that side. We switched him to using his juxta lite wrap at home. With that being said he tells me he has been using it although his legs extremely swollen and to be honest really does not appear that he has been. I cannot know that for sure however. Nonetheless he has multiple new wounds on the right lower extremity at this time. Obviously we will have to see about getting this rewrapped for him today. 08/11/2019 on evaluation today patient appears to be doing fairly well with  regard to his wounds. He has been tolerating the dressing changes including the compression wraps without complication. He still has a lot of edema in his  upper thigh regions bilaterally he is supposed to be seeing the lymphedema clinic on the 15th of this month once his wraps arrive for the upper part of his legs. 08/18/2019 on evaluation today patient appears to be doing well with regard to his bilateral lower extremities at this point. He has been tolerating the dressing changes without complication. Fortunately there is no signs of active infection which is also good news. He does have a couple weeping areas on the first and second toe of the right foot he also has just a small area on the left foot upper leg and a small area on the left lower leg but overall he is doing quite well in my opinion. He is supposed to be getting his wraps shortly in fact tomorrow and then subsequently is seeing the lymphedema clinic next Wednesday on the 21st. Of note he is also leaving on the 25th to go on vacation for a week to the beach. For that reason and since there is some uncertainty about what there can be doing at lymphedema clinic next Wednesday I am get a make an appointment for next Friday here for [place] to see what we need to do for him prior to him leaving for vacation. 10/23; patient arrives in considerable pain predominantly in the upper posterior calf just distal to the popliteal fossa also in the wound anteriorly above the major wound. This is probably cellulitis and he has had this recurrently in the past. He has no open wound on the right side and he has had an Radio broadcast assistant in that area. Finally I note that he has an area on the left posterior calf which by enlarge is mostly epithelialized. This protrudes beyond the borders of the surrounding skin in the setting of dry scaly skin and lymphedema. The patient is leaving for [facility] on Sunday. Per his longstanding pattern, he will not take his compression pumps with him predominantly out of fear that they will be stolen. He therefore asked that we put a Unna boot back on the right leg. He will also  contact the wound care center in [facility] to see if they can change his dressing in the mid week. 11/3; patient returned from his vacation to [facility]. He was seen on 1 occasion at their wound care center. They did a 2 layer compression system as they did not have our 4-layer wrap. I am not completely certain what they put on the wounds. They did not change the Unna boot on the right. The patient is also seeing a lymphedema specialist physical therapist in [place]. It appears that he has some compression sleeve for his thighs which indeed look quite a bit better than I am used to seeing. He pumps over these with his external compression pumps. 11/10; the patient has a new wound on the right medial thigh otherwise there is no open areas on the right. He has an area on the left leg posteriorly anteriorly and medially and an area over the left second toe. We have been using silver alginate. He thinks the injury on his thigh is secondary to friction from the compression sleeve he has. 11/17; the patient has a new wound on the right medial thigh last week. He thinks this is because he did not have a underlying stocking for his thigh juxta  lite apparatus. He now has this. The area is fairly large and somewhat angry but I do not think he has underlying cellulitis. ooHe has a intact blister on the right anterior tibial area. ooSmall wound on the right great toe dorsally ooSmall area on the medial left calf. 11/30; the patient does not have any open areas on his right leg and we did not take his juxta lite stocking off. However he states that on Friday his compression wrap fell down lodging around his upper mid calf area. As usual this creates a lot of problems for him. He called urgently today to be seen for a nurse visit however the nurse visit turned into a provider visit because of extreme erythema and pain in the left anterior tibia extending laterally and posteriorly. The area that is  problematic is extensive 10/06/2019 upon evaluation today patient actually appears to be doing poorly in regard to his left lower extremity. He Dr. Leanord Hawking did place him on doxycycline this past Monday apparently due to the fact that he was doing much worse in regard to this left leg. Fortunately the doxycycline does seem to be helping. Unfortunately we are still having a very difficult time getting his edema under any type of control in order to anticipate discharge at some point. The only way were really able to control his lymphedema really is with compression wraps and that has only even seemingly temporary. He has been seeing a lymphedema clinic they are trying to help in this regard but still this has been somewhat frustrating in general for the patient. 10/13/19 on evaluation today patient appears to be doing excellent with regard to his right lower extremity as far as the wounds are concerned. His swelling is still quite extensive unfortunately. He is still having a lot of drainage from the thigh areas bilaterally which is unfortunate. He's been going to lymphedema clinic but again he still really does not have this edema under control as far as his lower extremities are concern. With regard to his left lower extremity this seems to be improving and I do believe the doxycycline has been of benefit for him. He is about to complete the doxycycline. 10/20/2019 on evaluation today patient appears to be doing poorly in regard to his bilateral lower extremities. More in the right thigh he has a lot of irritation at this site unfortunately. In regard to the left lower extremity the wrap was not quite as high it appears and does seem to have caused him some trouble as well. Fortunately there is no evidence of systemic infection though he does have some blue-green drainage which has me concerned for the possibility of Pseudomonas. He tells me he is previously taking Cipro without complications and he really  does not care for Levaquin however due to some of the side effects he has. He is not allergic to any medications specifically antibiotics that were aware of. 10/27/2019 on evaluation today patient actually does appear to be for the most part doing better when compared to last week's evaluation. With that being said he still has multiple open wounds over the bilateral lower extremities. He actually forgot to start taking the Cipro and states that he still has the whole bottle. He does have several new blisters on left lower extremity today I think I would recommend he go ahead and take the Cipro based on what I am seeing at this point. 12/30-Patient comes at 1 week visit, 4 layer compression wraps on the left and  Unna boot on the right, primary dressing Xtrasorb and silver alginate. Patient is taking his Cipro and has a few more days left probably 5-6, and the legs are doing better. He states he is using his compressions devices which I believe he has 11/10/2019 on evaluation today patient actually appears to be much better than last time I saw him 2 weeks ago. His wounds are significantly improved and overall I am very pleased in this regard. Fortunately there is no signs of active infection at this time. He is just a couple of days away from completing Cipro. Overall his edema is much better he has been using his lymphedema pumps which I think is also helping at this point. 11/17/2019 on evaluation today patient appears to be doing excellent in regard to his wounds in general. His legs are swollen but not nearly as much as they have been in the past. Fortunately he is tolerating the compression wraps without complication. No fevers, chills, nausea, vomiting, or diarrhea. He does have some erythema however in the distal portion of his right lower extremity specifically around the forefoot and toes there is a little bit of warmth here as well. 11/24/2019 on evaluation today patient appears to be doing well  with regard to his right lower extremity I really do not see any open wounds at this point. His left lower extremity does have several open areas and his right medial thigh also is open. Other than this however overall the patient seems to be making good progress and I am very pleased at this point. 12/01/2019 on evaluation today patient appears to be doing poorly at this point in regard to his left lower extremity has several new blisters despite the fact that we have him in compression wraps. In fact he had a 4-layer compression wrap, his upper thigh wrapped from lymphedema clinic, and a juxta light over top of the 4 layer compression wrap the lymphedema clinic applied and despite all this he still develop blisters underneath. Obviously this does have me concerned about the fact that unfortunately despite what we are doing to try to get wounds healed he continues to have new areas arise I do not think he is ever good to be at the point where he can realistically just use wraps at home to keep things under control. Typically when we heal him it takes about 1-2 days before he is back in the clinic with severe breakdown and blistering of his lower extremities bilaterally. This is happened numerous times in the past. Unfortunately I think that we may need some help as far as overall fluid overload to kind of limit what we are seeing and get things under better control. 12/08/2019 on evaluation today patient presents for follow-up concerning his ongoing bilateral lower extremity edema. Unfortunately he is still having quite a bit of swelling the compression wraps are controlling this to some degree but he did see Dr. [name] his cardiologist I do have that available for review today as far as the appointment was concerned that was on 12/06/2019. Obviously that she has been 2 days ago. The patient states that he is only been taking the Lasix 80 mg 1 time a day he had told me previously he was taking this twice a  day. Nonetheless Dr. [name] recommended this be up to 80 mg 2 times a day for the patient as he did appear to be fluid overloaded. With that being said the patient states he did this yesterday and he  was unable to go anywhere or do anything due to the fact that he was constantly having to urinate. Nonetheless I think that this is still good to be something that is important for him as far as trying to get his edema under control at all things that he is going to be able to just expect his wounds to get under control and things to be better without going through at least a period of time where he is trying to stabilize his fluid management in general and I think increasing the Lasix is likely the first step here. It was also mentioned the possibility that the patient may require metolazone. With that being said he wanted to have the patient take Lasix twice a day first and then reevaluating 2 months to see where things stand. 12/15/2019 upon evaluation today patient appears to be doing regard to his legs although his toes are showing some signs of weeping especially on the left at this point to some degree on the right. There does not appear to be any signs of active infection and overall I do feel like the compression wraps are doing well for him but he has not been able to take the Lasix at home and the increased dose that Dr. [name] recommended. He tells me that just not go to be feasible for him. Nonetheless I think in this case he should probably send a message to Dr. [name] in order to discuss options from the standpoint of possible admission to get the fluid off or otherwise going forward. 12/22/2019 upon evaluation today patient appears to be doing fairly well with regard to his lower extremities at this point. In fact he would be doing excellent if it was not for the fact that his right anterior thigh apparently had an allergic reaction to adhesive tape that he used. The wound itself that we have  been monitoring actually appears to be healed. There is a lot of irritation at this point. 12/29/2019 upon evaluation today patient appears to be doing well in regard to his lower extremities. His left medial thigh is open and somewhat draining today but this is the only region that is open the right has done much better with the treatment utilizing the steroid cream that I prescribed for him last week. Overall I am pleased in that regard. Fortunately there is no signs of active infection at this time. No fevers, chills, nausea, vomiting, or diarrhea. 01/05/2020 upon evaluation today patient appears to be doing more poorly in regard to his right lower extremity at this point upon evaluation today. Unfortunately he continues to have issues in this regard and I think the biggest issue is controlling his edema. This obviously is not very well controlled at this point is been recommended that he use the Lasix twice a day but he has not been able to do that. Unfortunately I think this is leading to an issue where honestly he is not really able to effectively control his edema and therefore the wounds really are not doing significantly better. I do not think that he is going to be able to keep things under good control unless he is able to control his edema much better. I discussed this again in great detail with him today. 01/12/2020 good news is patient actually appears to be doing quite well today at this point. He does have an appointment with lymphedema clinic tomorrow. His legs appear healed and the toe on the left is almost completely healed. In  general I am very pleased with how things stand at this point. 01/19/2020 upon evaluation today patient appears to actually be doing well in regard to his lower extremities there is nothing open at this point. Fortunately he has done extremely well more recently. Has been seeing lymphedema clinic as well. With that being said he has Velcro wraps for his lower legs  as well as his upper legs. The only wound really is on his toe which is the right great toe and this is barely anything even there. With all that being said I think it is good to be appropriate today to go ahead and switch him over to the Velcro compression wraps. 01/26/2020 upon evaluation today patient appears to be doing worse with regard to his lower extremities after last week switch him to Velcro compression wraps. Unfortunately he lasted less than 24 hours he did not have the sock portion of his Velcro wrap on the left leg and subsequently developed a blister underneath the Velcro portion. Obviously this is not good and not what we were looking for at this point. He states the lymphedema clinic did tell him to wear the wrap for 23 hours and take him off for 1 I am okay with that plan but again right now we got a get things back under control again he may have some cellulitis noted as well. 02/02/2020 upon evaluation today patient unfortunately appears to have several areas of blistering on his bilateral lower extremities today mainly on the feet. His legs do seem to be doing somewhat better which is good news. Fortunately there is no evidence of active infection at this time. No fevers, chills, nausea, vomiting, or diarrhea. 02/16/2020 upon evaluation today patient appears to be doing well at this time with regard to his legs. He has a couple weeping areas on his toes but for the most part everything is doing better and does appear to be sealed up on his legs which is excellent news. We can continue with wrapping him at this point as he had every time we discontinue the wraps he just breaks out with new wounds. There is really no point in is going forward with this at this point. 03/08/2020 upon evaluation today patient actually appears to be doing quite well with regard to his lower extremity ulcers. He has just a very superficial and really almost nonexistent blister on the left lower extremity he  has in general done very well with the compression wraps. With that being said I do not see any signs of infection at this time which is good news. 03/29/2020 upon evaluation today patient appears to be doing well with regard to his wounds currently except for where he had several new areas that opened up due to some of the wrap slipping and causing him trouble. He states he did not realize they had slipped. Nonetheless he has a 1 area on the right and 3 new areas on the left. Fortunately there is no signs of active infection at this time which is great news. 04/05/2020 upon evaluation today patient actually appears to be doing quite well in general in regard to his legs currently. Fortunately there is no signs of active infection at this time. No fevers, chills, nausea, vomiting, or diarrhea. He tells me next week that he will actually be seen in the lymphedema clinic on Thursday at 10 AM I see him on Wednesday next week. 04/12/2020 upon evaluation today patient appears to be doing very well with  regard to his lower extremities bilaterally. In fact he does not appear to have any open wounds at this point which is good news. Fortunately there is no signs of active infection at this time. No fevers, chills, nausea, vomiting, or diarrhea. Objective Constitutional Well-nourished and well-hydrated in no acute distress. Vitals Time Taken: 11:16 AM, Height: 70 in, Weight: 380.2 lbs, BMI: 54.5, Temperature: 97.7 F, Pulse: 74 bpm, Respiratory Rate: 18 breaths/min, Blood Pressure: 157/62 mmHg. Respiratory normal breathing without difficulty. Psychiatric this patient is able to make decisions and demonstrates good insight into disease process. Alert and Oriented x 3. pleasant and cooperative. General Notes: Patient's wound bed currently showed signs of good epithelization at this point. There does not appear to be any evidence of active infection and overall I am extremely pleased with where things stand  today. Unfortunately he did cancel his appointment with lymphedema clinic tomorrow as he states he had to take his cousin for a surgery appointment in [place] which is also incidentally where his physical therapist is. However he states he could not work it out to make the to coincide. Integumentary (Hair, Skin) Wound #169 status is Healed - Epithelialized. Original cause of wound was Gradually Appeared. The wound is located on the Left,Dorsal Foot. The wound measures 0cm length x 0cm width x 0cm depth; 0cm^2 area and 0cm^3 volume. Wound #170 status is Healed - Epithelialized. Original cause of wound was Gradually Appeared. The wound is located on the Left,Distal,Dorsal Foot. The wound measures 0cm length x 0cm width x 0cm depth; 0cm^2 area and 0cm^3 volume. 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 Skin Barriers/Peri-Wound Care: Moisturizing lotion - to legs nightly Wound Cleansing: May shower and wash wound with soap and water. Edema Control: Avoid standing for long periods of time Elevate legs to the level of the heart or above for 30 minutes daily and/or when sitting, a frequency of: - whenever sitting Exercise regularly Support Garment 30-40 mm/Hg pressure to: - compression stockings and juxtalites to lower legs daily Segmental Compressive Device. - lymphadema pumps 60 minutes 2 times per day Additional Orders / Instructions: Other: - take lasix as prescribed by cardiologist 1. At this point I would recommend that we  discontinue the compression wraps in the clinic in lieu of the patient's Velcro compression wraps he has juxta lites for his lower extremities, knees, and thighs he should be wearing all of these together. 2. I am also can recommend at this point that the patient go ahead and continue with elevation as well as using lymphedema pumps. He needs to try to keep his edema under good control. 3. I would also recommend that the patient continue to exercise on a regular basis he really has not done that at this point. 4. Also suggest that he take his fluid pills as prescribed cardiologist although again he really has not been taking increased dosage fully he tells me he cannot take 2 at night he was taken 2 in the morning of his fluid pills. We will see patient back for reevaluation in 1 week here in the clinic. If anything worsens or changes patient  will contact our office for additional recommendations. Electronic Signature(s) Signed: 04/12/2020 2:26:27 PM By: Lenda Kelp PA-C Entered By: Lenda Kelp on 04/12/2020 14:26:27 -------------------------------------------------------------------------------- SuperBill Details Patient Name: Date of Service: Kevin [name], Cormac J. 04/12/2020 Medical Record Number: [id number] Patient Account Number: [id number] Date of Birth/Sex: Treating RN: 1951/02/06 (69 y.o. [name] Primary Care Provider: Marney Setting, [name] Other Clinician: Referring Provider: Treating Provider/Extender: Lenda Kelp [facility] [name], [name] in Treatment: 220 Diagnosis Coding ICD-10 Codes Code Description [phone] Non-pressure chronic ulcer of right calf limited to breakdown of skin L97.221 Non-pressure chronic ulcer of left calf limited to breakdown of skin 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 of bilateral lower extremity I89.0 Lymphedema, not elsewhere classified E11.622 Type 2 diabetes mellitus with other skin ulcer E11.40 Type 2  diabetes mellitus with diabetic neuropathy, unspecified L03.116 Cellulitis of left lower limb Facility Procedures Physician Procedures : CPT4 Code Description Modifier [id number] - WC PHYS LEVEL 3 - EST PT ICD-10 Diagnosis Description 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 skin L97.221 Non-pressure chronic ulcer of left calf limited to breakdown of  skin 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 of bilateral lower extremity I89.0 Lymphedema, not elsewhere classified Quantity: 1 Electronic Signature(s) Signed: 04/12/2020 2:27:07 PM By: Lenda Kelp PA-C Entered By: Lenda Kelp on 04/12/2020 14:27:05

## 2020-04-13 ENCOUNTER — Encounter (HOSPITAL_BASED_OUTPATIENT_CLINIC_OR_DEPARTMENT_OTHER): Payer: Medicare Other | Admitting: Internal Medicine

## 2020-04-13 DIAGNOSIS — E1151 Type 2 diabetes mellitus with diabetic peripheral angiopathy without gangrene: Secondary | ICD-10-CM | POA: Diagnosis not present

## 2020-04-13 DIAGNOSIS — L97221 Non-pressure chronic ulcer of left calf limited to breakdown of skin: Secondary | ICD-10-CM | POA: Diagnosis not present

## 2020-04-13 DIAGNOSIS — L03116 Cellulitis of left lower limb: Secondary | ICD-10-CM | POA: Diagnosis not present

## 2020-04-13 DIAGNOSIS — E1121 Type 2 diabetes mellitus with diabetic nephropathy: Secondary | ICD-10-CM | POA: Diagnosis not present

## 2020-04-13 DIAGNOSIS — E11622 Type 2 diabetes mellitus with other skin ulcer: Secondary | ICD-10-CM | POA: Diagnosis not present

## 2020-04-13 DIAGNOSIS — Z79899 Other long term (current) drug therapy: Secondary | ICD-10-CM | POA: Diagnosis not present

## 2020-04-13 DIAGNOSIS — I87333 Chronic venous hypertension (idiopathic) with ulcer and inflammation of bilateral lower extremity: Secondary | ICD-10-CM | POA: Diagnosis not present

## 2020-04-13 DIAGNOSIS — E1140 Type 2 diabetes mellitus with diabetic neuropathy, unspecified: Secondary | ICD-10-CM | POA: Diagnosis not present

## 2020-04-13 DIAGNOSIS — I890 Lymphedema, not elsewhere classified: Secondary | ICD-10-CM | POA: Diagnosis not present

## 2020-04-13 DIAGNOSIS — L97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 right calf limited to breakdown of skin: Secondary | ICD-10-CM | POA: Diagnosis not present

## 2020-04-13 DIAGNOSIS — Z794 Long term (current) use of insulin: Secondary | ICD-10-CM | POA: Diagnosis not present

## 2020-04-13 NOTE — Progress Notes (Signed)
CARVIN, ALMAS ([id number]) Visit Report for 04/13/2020 Arrival Information Details Patient Name: Date of Service: CO ELIAKIM, TENDLER 04/13/2020 8:00 A M Medical Record Number: [id number] Patient Account Number: [id number] Date of Birth/Sex: Treating RN: [birth year] (69 y.o. [name] Primary Care Lakeya Mulka: Marney Setting, [name] Other Clinician: Referring Kennedi Lizardo: Treating Andreah Goheen/Extender: Townsend Roger, [name] in Treatment: 220 Visit Information History Since Last Visit Added or deleted any medications: No Patient Arrived: Walker Any new allergies or adverse reactions: No Arrival Time: 08:55 Had a fall or experienced change in No Accompanied By: alone activities of daily living that may affect Transfer Assistance: None risk of falls: Patient Identification Verified: Yes Signs or symptoms of abuse/neglect since last visito No Secondary Verification Process Completed: Yes Hospitalized since last visit: No Patient Requires Transmission-Based Precautions: No Implantable device outside of the clinic excluding No Patient Has Alerts: Yes cellular tissue based products placed in the center since last visit: Pain Present Now: No Electronic Signature(s) Signed: 04/13/2020 5:18:16 PM By: [name] RN, BSN Entered By: [name] on 04/13/2020 10:43:38 -------------------------------------------------------------------------------- Compression Therapy Details Patient Name: Date of Service: [name], Isair J. 04/13/2020 8:00 A M Medical Record Number: [id number] Patient Account Number: [id number] Date of Birth/Sex: Treating RN: November 15, 1950 (69 y.o. [name] Primary Care Jaquarious Grey: Marney Setting, [name] Other Clinician: Referring Kamdyn Covel: Treating Skila Rollins/Extender: Townsend Roger, [name] in Treatment: 220 Compression Therapy Performed for Wound Assessment: NonWound Condition Lymphedema - Left Leg Performed By: Clinician [name],  RN Compression Type: Four Layer Electronic Signature(s) Signed: 04/13/2020 5:18:16 PM By: [name] RN, BSN Entered By: [name] on 04/13/2020 12:10:15 -------------------------------------------------------------------------------- Encounter Discharge Information Details Patient Name: Date of Service: [name], Duquan J. 04/13/2020 8:00 A M Medical Record Number: [id number] Patient Account Number: [id number] Date of Birth/Sex: Treating RN: [birth year] (69 y.o. [name] Primary Care Phelan Goers: Marney Setting, [name] Other Clinician: Referring Ziah Leandro: Treating Beyonca Wisz/Extender: Townsend Roger, [name] in Treatment: 220 Encounter Discharge Information Items Discharge Condition: Stable Ambulatory Status: Walker Discharge Destination: Home Transportation: Private Auto Accompanied By: alone Schedule Follow-up Appointment: Yes Clinical Summary of Care: Patient Declined Electronic Signature(s) Signed: 04/13/2020 5:18:16 PM By: [name] RN, BSN Entered By: [name] on 04/13/2020 12:12:08 -------------------------------------------------------------------------------- General Visit Notes Details Patient Name: Date of Service: [name], Alesandro J. 04/13/2020 8:00 A M Medical Record Number: [id number] Patient Account Number: [id number] Date of Birth/Sex: Treating RN: [birth year] (69 y.o. [name] Primary Care Roxanne Orner: Marney Setting, [name] Other Clinician: Referring Yarrow Linhart: Treating Orazio Weller/Extender: Townsend Roger, [name] in Treatment: 220 Notes Patient arrived at clinic stating his has blisters that developed over night on his left leg. Multiple small blisters noted on left lower leg, mild weeping. I spoke with Allen Derry, PA, verbal order give to apply silver alginate and 4 layer compression to left leg. Electronic Signature(s) Signed: 04/13/2020 5:18:16 PM By: [name] RN, BSN Entered By: [name] on 04/13/2020  11:49:48 -------------------------------------------------------------------------------- Patient/Caregiver Education Details Patient Name: Date of Service: [name], [name] 6/10/2021andnbsp8:00 A M Medical Record Number: [id number] Patient Account Number: [id number] Date of Birth/Gender: Treating RN: [birth year] (69 y.o. [name] Primary Care Physician: Marney Setting, [name] Other Clinician: Referring Physician: Treating Physician/Extender: Townsend Roger, [name] in Treatment: 220 Education Assessment Education Provided To: Patient Education Topics Provided Venous: Methods: Explain/Verbal Responses: State content correctly Wound/Skin Impairment: Methods: Explain/Verbal Responses: State content correctly Electronic Signature(s) Signed: 04/13/2020 5:18:16 PM By:  Levan Hurst RN, BSN Entered By: Levan Hurst on 04/13/2020 12:11:53 -------------------------------------------------------------------------------- Vitals Details Patient Name: Date of Service: [name], Mikael J. 04/13/2020 8:00 A M Medical Record Number: [id number] Patient Account Number: [id number] Date of Birth/Sex: Treating RN: [birth year] (69 y.o. [name] Primary Care Chennel Olivos: [name], [name] Other Clinician: Referring Layal Javid: Treating Xana Bradt/Extender: Leonides Schanz, [name] in Treatment: 220 Vital Signs Time Taken: 08:57 Temperature (F): 98.0 Height (in): 70 Pulse (bpm): 65 Weight (lbs): 380.2 Respiratory Rate (breaths/min): 18 Body Mass Index (BMI): 54.5 Blood Pressure (mmHg): 157/71 Reference Range: 80 - 120 mg / dl Electronic Signature(s) Signed: 04/13/2020 5:18:16 PM By: Levan Hurst RN, BSN Entered By: Levan Hurst on 04/13/2020 08:57:55

## 2020-04-14 ENCOUNTER — Telehealth: Payer: Medicare Other | Admitting: Infectious Diseases

## 2020-04-14 DIAGNOSIS — I5032 Chronic diastolic (congestive) heart failure: Secondary | ICD-10-CM | POA: Diagnosis not present

## 2020-04-14 DIAGNOSIS — I110 Hypertensive heart disease with heart failure: Secondary | ICD-10-CM | POA: Diagnosis not present

## 2020-04-14 DIAGNOSIS — E1121 Type 2 diabetes mellitus with diabetic nephropathy: Secondary | ICD-10-CM | POA: Diagnosis not present

## 2020-04-14 DIAGNOSIS — I272 Pulmonary hypertension, unspecified: Secondary | ICD-10-CM | POA: Diagnosis not present

## 2020-04-14 DIAGNOSIS — Z794 Long term (current) use of insulin: Secondary | ICD-10-CM | POA: Diagnosis not present

## 2020-04-17 NOTE — Progress Notes (Signed)
MAHONRI, SEIDEN ([id number]) Visit Report for 04/13/2020 SuperBill Details Patient Name: Date of Service: CO ABIMAEL, ZEITER 04/13/2020 Medical Record Number: [id number] Patient Account Number: [id number] Date of Birth/Sex: Treating RN: 17-Sep-1951 (69 y.o. [name] Primary Care Provider: Marney Setting, [name] Other Clinician: Referring Provider: Treating Provider/Extender: Townsend Roger, [name] in Treatment: 220 Diagnosis Coding ICD-10 Codes Code Description [phone] Non-pressure chronic ulcer of right calf limited to breakdown of skin L97.221 Non-pressure chronic ulcer of left calf limited to breakdown of skin 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 of bilateral lower extremity I89.0 Lymphedema, not elsewhere classified E11.622 Type 2 diabetes mellitus with other skin ulcer 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, unspecified L03.116 Cellulitis of left lower limb Facility Procedures CPT4 Code Description Modifier Quantity [id number] (Facility Use Only) [phone] - APPLY MULTLAY COMPRS LWR LT LEG 1 Electronic Signature(s) Signed: 04/13/2020 5:18:16 PM By: [name] RN, BSN Signed: 04/17/2020 8:32:12 AM By: Baltazar Najjar MD Entered By: [name] on 04/13/2020 12:12:18

## 2020-04-18 DIAGNOSIS — I890 Lymphedema, not elsewhere classified: Secondary | ICD-10-CM | POA: Diagnosis not present

## 2020-04-18 DIAGNOSIS — R29898 Other symptoms and signs involving the musculoskeletal system: Secondary | ICD-10-CM | POA: Diagnosis not present

## 2020-04-19 ENCOUNTER — Other Ambulatory Visit: Payer: Self-pay

## 2020-04-19 ENCOUNTER — Encounter (HOSPITAL_BASED_OUTPATIENT_CLINIC_OR_DEPARTMENT_OTHER): Payer: Medicare Other | Admitting: Physician Assistant

## 2020-04-19 DIAGNOSIS — E1151 Type 2 diabetes mellitus with diabetic peripheral angiopathy without gangrene: Secondary | ICD-10-CM | POA: Diagnosis not present

## 2020-04-19 DIAGNOSIS — Z79899 Other long term (current) drug therapy: Secondary | ICD-10-CM | POA: Diagnosis not present

## 2020-04-19 DIAGNOSIS — I890 Lymphedema, not elsewhere classified: Secondary | ICD-10-CM | POA: Diagnosis not present

## 2020-04-19 DIAGNOSIS — L03116 Cellulitis of left lower limb: Secondary | ICD-10-CM | POA: Diagnosis not present

## 2020-04-19 DIAGNOSIS — I87333 Chronic venous hypertension (idiopathic) with ulcer and inflammation of bilateral lower extremity: Secondary | ICD-10-CM | POA: Diagnosis not present

## 2020-04-19 DIAGNOSIS — E1140 Type 2 diabetes mellitus with diabetic neuropathy, unspecified: Secondary | ICD-10-CM | POA: Diagnosis not present

## 2020-04-19 DIAGNOSIS — L97522 Non-pressure chronic ulcer of other part of left foot with fat layer exposed: Secondary | ICD-10-CM | POA: Diagnosis not present

## 2020-04-19 DIAGNOSIS — E1121 Type 2 diabetes mellitus with diabetic nephropathy: Secondary | ICD-10-CM | POA: Diagnosis not present

## 2020-04-19 DIAGNOSIS — E11622 Type 2 diabetes mellitus with other skin ulcer: Secondary | ICD-10-CM | POA: Diagnosis not present

## 2020-04-19 DIAGNOSIS — L97221 Non-pressure chronic ulcer of left calf limited to breakdown of skin: Secondary | ICD-10-CM | POA: Diagnosis not present

## 2020-04-19 DIAGNOSIS — L97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 right calf limited to breakdown of skin: Secondary | ICD-10-CM | POA: Diagnosis not present

## 2020-04-19 DIAGNOSIS — Z794 Long term (current) use of insulin: Secondary | ICD-10-CM | POA: Diagnosis not present

## 2020-04-19 NOTE — Progress Notes (Addendum)
ZACORY, FIOLA ([id number]) Visit Report for 04/19/2020 Chief Complaint Document Details Patient Name: Date of Service: CO SENA, CLOUATRE 04/19/2020 8:45 A M Medical Record Number: [id number] Patient Account Number: [id number] Date of Birth/Sex: Treating RN: 07-16-1951 (69 y.o. [name] Primary Care Provider: Marney Setting, [name] Other Clinician: Referring Provider: Treating Provider/Extender: Lenda Kelp [facility] [name], [name] in Treatment: 221 Information Obtained from: Patient Chief Complaint patient is here for evaluation venous/lymphedema weeping Electronic Signature(s) Signed: 04/19/2020 8:45:57 AM By: Lenda Kelp PA-C Entered By: Lenda Kelp on 04/19/2020 08:45:56 -------------------------------------------------------------------------------- Debridement Details Patient Name: Date of Service: [name], Guillaume J. 04/19/2020 8:45 A M Medical Record Number: [id number] Patient Account Number: [id number] Date of Birth/Sex: Treating RN: 1950/12/01 (69 y.o. [name] Primary Care Provider: Marney Setting, [name] Other Clinician: Referring Provider: Treating Provider/Extender: Lenda Kelp [facility] [name], [name] in Treatment: 221 Debridement Performed for Assessment: Wound #[id number] Left,Dorsal Foot Performed By: Physician Lenda Kelp, PA Debridement Type: Debridement Level of Consciousness (Pre-procedure): Awake and Alert Pre-procedure Verification/Time Out Yes - 10:10 Taken: Start Time: 10:10 T Area Debrided (L x W): otal 1.8 (cm) x 4.5 (cm) = 8.1 (cm) Tissue and other material debrided: Non-Viable, Skin: Epidermis Level: Skin/Epidermis Debridement Description: Selective/Open Wound Instrument: Curette Bleeding: None End Time: 10:13 Procedural Pain: 0 Post Procedural Pain: 0 Response to Treatment: Procedure was tolerated well Level of Consciousness (Post- Awake and Alert procedure): Post Debridement Measurements of Total Wound Length: (cm)  1.8 Width: (cm) 4.5 Depth: (cm) 0.1 Volume: (cm) 0.636 Character of Wound/Ulcer Post Debridement: Improved Post Procedure Diagnosis Same as Pre-procedure Electronic Signature(s) Signed: 04/20/2020 1:39:37 PM By: Lenda Kelp PA-C Signed: 04/20/2020 5:22:52 PM By: Zenaida Deed RN, BSN Entered By: Zenaida Deed on 04/19/2020 10:23:10 -------------------------------------------------------------------------------- HPI Details Patient Name: Date of Service: [name], Katai J. 04/19/2020 8:45 A M Medical Record Number: [id number] Patient Account Number: [id number] Date of Birth/Sex: Treating RN: May 11, 1951 (69 y.o. [name] Primary Care Provider: Marney Setting, [name] Other Clinician: Referring Provider: Treating Provider/Extender: Lenda Kelp [facility] [name], [name] in Treatment: 221 History of Present Illness HPI Description: Referred by PCP for consultation. Patient has long standing history of BLE venous stasis, no prior ulcerations. At beginning of month, developed cellulitis and weeping. Received IM Rocephin followed by Keflex and resolved. Wears compression stocking, appr 6 months old. Not sure strength. No present drainage. 01/22/16 this is a patient who is a type II diabetic on insulin. 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 He tells me he religiously wears pressure stockings of uncertain strength. He was here with weeping edema about 8 months ago but did not have an open wound. Roughly a month ago he had a reopening on his bilateral legs. He is been using bandages and Neosporin. He does not complain of pain. 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 Last BUN/creatinine I have is from 11/20/15 at 13 and 1.0 respectively 01/29/16; patient arrives today having tolerated the Profore wrap. He brought in his stockings and these are 18  mmHg stockings he bought from [place]. The compression here is likely inadequate. He does not complain of pain or excessive drainage she has no systemic symptoms. The wound on the right looks improved as does the one on the left although one on the left is more substantial with still tissue  at risk below the actual wound area on the bilateral posterior calf 02/05/16; patient arrives with poor edema control. He states that we did put a 4 layer compression on it last week. No weight appear 5 this. 02/12/16; the area on the posterior right Has healed. The left Has a substantial wound that has necrotic surface eschar that requires a debridement with a curette. 02/16/16;the patient called or a Nurse visit secondary to increased swelling. He had been in earlier in the week with his right leg healed. He was transitioned to is on pressure stocking on the right leg with the only open wound on the left, a substantial area on the left posterior calf. Note he has a history of severe lower extremity edema, he has a history of chronic atrial fibrillation but not heart failure per my notes but I'll need to research this. He is not complaining of chest pain shortness of breath or orthopnea. The intake nurse noted blisters on the previously closed right leg 02/19/16; this is the patient's regular visit day. I see him on Friday with escalating edema new wounds on the right leg and clear signs of at least right ventricular heart failure. I increased his Lasix to 40 twice a day. He is returning currently in follow-up. States he is noticed a decrease in that the edema 02/26/16 patient's legs have much less edema. There is nothing really open on the right leg. The left leg has improved condition of the large superficial wound on the posterior left leg 03/04/16; edema control is very much better. The patient's right leg wounds have healed. On the left leg he continues to have severe venous inflammation on the posterior aspect of  the left leg. There is no tenderness and I don't think any of this is cellulitis. 03/11/16; patient's right leg is married healed and he is in his own stocking. The patient's left leg has deteriorated somewhat. There is a lot of erythema around the wound on the posterior left leg. There is also a significant rim of erythema posteriorly just above where the wrap would've ended there is a new wound in this location and a lot of tenderness. Can't rule out cellulitis in this area. 03/15/16; patient's right leg remains healed and he is in his own stocking. The patient's left leg is much better than last review. His major wound on the posterior aspect of his left Is almost fully epithelialized. He has 3 small injuries from the wraps. Really. Erythema seems a lot better on antibiotics 03/18/16; right leg remains healed and he is in his own stocking. The patient's left leg is much better. The area on the posterior aspect of the left calf is fully epithelialized. His 3 small injuries which were wrap injuries on the left are improved only one seems still open his erythema has resolved 03/25/16; patient's right leg remains healed and he is in his own stocking. There is no open area today on the left leg posterior leg is completely closed up. His wrap injuries at the superior aspect of his leg are also resolved. He looks as though he has some irritation on the dorsal ankle but this is fully epithelialized without evidence of infection. 03/28/16; we discharged this patient on Monday. Transitioned him into his own stocking. There were problems almost immediately with uncontrolled swelling weeping edema multiple some of which have opened. He does not feel systemically unwell in particular no chest pain no shortness of breath and he does not feel 04/08/16; the edema  is under better control with the Profore light wrap but he still has pitting edema. There is one large wound anteriorly 2 on the medial aspect of his left leg  and 3 small areas on the superior posterior calf. Drainage is not excessive he is tolerating a Profore light well 04/15/16; put a Profore wrap on him last week. This is controlled is edema however he had a lot of pain on his left anterior foot most of his wounds are healed 04/22/16 once again the patient has denuded areas on the left anterior foot which he states are because his wrap slips up word. He saw his primary physician today is on Lasix 40 twice a day and states that he his weight is down 20 pounds over the last 3 months. 04/29/16: Much improved. left anterior foot much improved. He is now on Lasix 80 mg per day. Much improved edema control 05/06/16; I was hoping to be able to discharge him today however once again he has blisters at a low level of where the compression was placed last week mostly on his left lateral but also his left medial leg and a small area on the anterior part of the left foot. 05/09/16; apparently the patient went home after his appointment on 7/4 later in the evening developing pain in his upper medial thigh together with subjective fever and chills although his temperature was not taken. The pain was so intense he felt he would probably have to call 911. However he then remembered that he had leftover doxycycline from a previous round of antibiotics and took these. By the next morning he felt a lot better. He called and spoke to one of our nurses and I approved doxycycline over the phone thinking that this was in relation to the wounds we had previously seen although they were definitely were not. The patient feels a lot better old fever no chills he is still working. Blood sugars are reasonably controlled 05/13/16; patient is back in for review of his cellulitis on his anterior medial upper thigh. He is taking doxycycline this is a lot better. Culture I did of the nodular area on the dorsal aspect of his foot grew MRSA this also looks a lot better. 05/20/16; the patient is  cellulitis on the medial upper thigh has resolved. All of his wound areas including the left anterior foot, areas on the medial aspect of the left calf and the lateral aspect of the calf at all resolved. He has a new blister on the left dorsal foot at the level of the fourth toe this was excised. No evidence of infection 05/27/16; patient continues to complain weeping edema. He has new blisterlike wounds on the left anterior lateral and posterior lateral calf at the top of his wrap levels. The area on his left anterior foot appears better. He is not complaining of fever, pain or pruritus in his feet. 05/30/16; the patient's blisters on his left anterior leg posterior calf all look improved. He did not increase the Lasix 100 mg as I suggested because he was going to run out of his 40 mg tablets. He is still having weeping edema of his toes 06/03/16; I renewed his Lasix at 80 mg once a day as he was about to run out when I last saw him. He is on 80 mg of Lasix now. I have asked him to cut down on the excessive amount of water he was drinking and asked him to drink according to his thirst  mechanisms 06/12/2016 -- was seen 2 days ago and was supposed to wear his compression stockings at home but he is developed lymphedema and superficial blisters on the left lower extremity and hence came in for a review 06/24/16; the remaining wound is on his left anterior leg. He still has edema coming from between his toes. There is lymphedema here however his edema is generally better than when I last saw this. He has a history of atrial fibrillation but does not have a known history of congestive heart failure nevertheless I think he probably has this at least on a diastolic basis. 07/01/16 I reviewed his echocardiogram from January 2017. This was essentially normal. He did not have LVH, EF of 55-60%. His right ventricular function was normal although he did have trivial tricuspid and pulmonic regurgitation. This is not  audible on exam however. I increased his Lasix to do massive edema in his legs well above his knees I think in early July. He was also drinking an excessive amount of water at the time. 07/15/16; missed his appointment last week because of the Labor Day holiday on Monday. He could not get another appointment later in the week. Started to feel the wrap digging in superiorly so we remove the top half and the bottom half of his wrap. He has extensive erythema and blistering superiorly in the left leg. Very tender. Very swollen. Edema in his foot with leaking edema fluid. He has not been systemically unwell 07/22/16; the area on the left leg laterally required some debridement. The medial wounds look more stable. His wrap injury wounds appear to have healed. Edema and his foot is better, weeping edema is also better. He tells me he is meeting with the supplier of the external compression pumps at work 08/05/16; the patient was on vacation last week in [facility]. His wrap is been on for an extended period of time. Also over the weekend he developed an extensive area of tender erythema across his anterior medial thigh. He took to doxycycline yesterday that he had leftover from a previous prescription. The patient complains of weeping edema coming out of his toes 08/08/16; I saw this patient on 10/2. He was tender across his anterior thigh. I put him on doxycycline. He returns today in follow-up. He does not have any open wounds on his lower leg, he still has edema weeping into his toes. 08/12/16; patient was seen back urgently today to follow-up for his extensive left thigh cellulitis/erysipelas. He comes back with a lot less swelling and erythema pain is much better. I believe I gave him Augmentin and Cipro. His wrap was cut down as he stated a roll down his legs. He developed blistering above the level of the wrap that remained. He has 2 open blisters and 1 intact. 08/19/16; patient is been doing his  primary doctor who is increased his Lasix from 40-80 once a day or 80 already has less edema. Cellulitis has remained improved in the left thigh. 2 open areas on the posterior left calf 08/26/16; he returns today having new open blisters on the anterior part of his left leg. He has his compression pumps but is not yet been shown how to use some vital representative from the supplier. 09/02/16 patient returns today with no open wounds on the left leg. Some maceration in his plantar toes 09/10/2016 -- Dr. Leanord Hawking had recently discharged him on 09/02/2016 and he has come right back with redness swelling and some open ulcers on his  left lower extremity. He says this was caused by trying to apply his compression stockings and he's been unable to use this and has not been able to use his lymphedema pumps. He had some doxycycline leftover and he has started on this a few days ago. 09/16/16; there are no open wounds on his leg on the left and no evidence of cellulitis. 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 He does not complain of symptoms here. I am not clear use using his external compression pumps. 09/23/16; I have not seen this patient in 2 weeks. He canceled his appointment 10 days ago as he was going on vacation. He tells me that on Monday he noticed a large area on his posterior left leg which is been draining copiously and is reopened into a large wound. He is been using ABDs and the external part of his juxtalite, according to our nurse this was not on properly. 10/07/16; Still a substantial area on the posterior left leg. Using silver alginate 10/14/16; in general better although there is still open area which looks healthy. Still using silver alginate. He reminds me that this happen before he left for [facility]. T oday while he was showering in the morning. He had been using his juxtalite's 10/21/16; the area on his posterior left leg  is fully epithelialized. However he arrives today with a large area of tender erythema in his medial and posterior left thigh just above the knee. I have marked the area. Once again he is reluctant to consider hospitalization. I treated him with oral antibiotics in the past for a similar situation with resolution I think with doxycycline however this area it seems more extensive to me. He is not complaining of fever but does have chills and says states he is thirsty. His blood sugar today was in the 140s at home 10/25/16 the area on his posterior left leg is fully epithelialized although there is still some weeping edema. The large area of tenderness and erythema in his medial and posterior left thigh is a lot less tender although there is still a lot of swelling in this thigh. He states he feels a lot better. He is on doxycycline and Augmentin that I started last week. This will continued until Tuesday, December 26. I have ordered a duplex ultrasound of the left thigh rule out DVT whether there is an abscess something that would need to be drained I would also like to know. 11/01/16; he still has weeping edema from a not fully epithelialized area on his left posterior calf. Most of the rest of this looks a lot better. He has completed his antibiotics. His thigh is a lot better. Duplex ultrasound did not show a DVT in the thigh 11/08/16; he comes in today with more Denuded surface epithelium from the posterior aspect of his calf. There is no real evidence of cellulitis. The superior aspect of his wrap appears to have put quite an indentation in his leg just below the knee and this may have contributed. He does not complain of pain or fever. We have been using silver alginate as the primary dressing. The area of cellulitis in the right thigh has totally resolved. He has been using his compression stockings once a week 11/15/16; the patient arrives today with more loss of epithelium from the posterior  aspect of his left calf. He now has a fairly substantial wound in this area. The reason behind this  deterioration isn't exactly clear although his edema is not well controlled. He states he feels he is generally more swollen systemically. He is not complaining of chest pain shortness of breath fever. T me he has an appointment with his primary physician in early February. He is on 80 mg of oral ells Lasix a day. He claims compliance with the external compression pumps. He is not having any pain in his legs similar to what he has with his recurrent cellulitis 11/22/16; the patient arrives a follow-up of his large area on his left lateral calf. This looks somewhat better today. He came in earlier in the week for a dressing change since I saw him a week ago. He is not complaining of any pain no shortness of breath no chest pain 11/28/16; the patient arrives for follow-up of his large area on the left lateral calf this does not look better. In fact it is larger weeping edema. The surface of the wound does not look too bad. We have been using silver alginate although I'm not certain that this is a dressing issue. 12/05/16; again the patient follows up for a large wound on the left lateral and left posterior calf this does not look better. There continues to be weeping edema necrotic surface tissue. More worrisome than this once again there is erythema below the wound involving the distal Achilles and heel suggestive of cellulitis. He is on his feet working most of the day of this is not going well. We are changing his dressing twice a week to facilitate the drainage. 12/12/16; not much change in the overall dimensions of the large area on the left posterior calf. This is very inflamed looking. I gave him an. Doxycycline last week does not really seem to have helped. He found the wrap very painful indeed it seems to of dog into his legs superiorly and perhaps around the heel. He came in early today because the  drainage had soaked through his dressings. 12/19/16- patient arrives for follow-up evaluation of his left lower extremity ulcers. He states that he is using his lymphedema pumps once daily when there is "no drainage". He admits to not using his lipedema pumps while under current treatment. His blood sugars have been consistently between 150-200. 12/26/16; the patient is not using his compression pumps at home because of the wetness on his feet. I've advised him that I think it's important for him to use this daily. He finds his feet too wet, he can put a plastic bag over his legs while he is in the pumps. Otherwise I think will be in a vicious circle. We are using silver alginate to the major area on his left posterior calf 01/02/17; the patient's posterior left leg has further of all into 3 open wounds. All of them covered with a necrotic surface. He claims to be using his compression pumps once a day. His edema control is marginal. Continue with silver alginate 01/10/17; the patient's left posterior leg actually looks somewhat better. There is less edema, less erythema. Still has 3 open areas covered with a necrotic surface requiring debridement. He claims to be using his compression pumps once a day his edema control is better 01/17/17; the patient's left posterior calf look better last week when I saw him and his wrap was changed 2 days ago. He has noted increasing pain in the left heel and arrives today with much larger wounds extensive erythema extending down into the entire heel area especially tender medially. He  is not systemically unwell CBGs have been controlled no fever. Our intake nurse showed me limegreen drainage on his AVD pads. 01/24/17; his usual this patient responds nicely to antibiotics last week giving him Levaquin for presumed Pseudomonas. The whole entire posterior part of his leg is much better much less inflamed and in the case of his Achilles heel area much less tender. He has also  had some epithelialization posteriorly there are still open areas here and still draining but overall considerably better 01/31/17- He has continue to tolerate the compression wraps. he states that he continues to use the lymphedema pumps daily, and can increase to twice daily on the weekends. He is voicing no complaints or concerns regarding his LLE ulcers 02/07/17-he is here for follow-up evaluation. He states that he noted some erythema to the left medial and anterior thigh, which he states is new as of yesterday. He is concerned about recurrent cellulitis. He states his blood sugars have been slightly elevated, this morning in the 180s 02/14/17; he is here for follow-up evaluation. When he was last here there was erythema superiorly from his posterior wound in his anterior thigh. He was prescribed Levaquin however a culture of the wound surface grew MRSA over the phone I changed him to doxycycline on Monday and things seem to be a lot better. 02/24/17; patient missed his appointment on Friday therefore we changed his nurse visit into a physician visit today. Still using silver alginate on the large area of the posterior left thigh. He isn't new area on the dorsal left second toe 03/03/17; actually better today although he admits he has not used his external compression pumps in the last 2 days or so because of work responsibilities over the weekend. 03/10/17; continued improvement. External compression pumps once a day almost all of his wounds have closed on the posterior left calf. Better edema control 03/17/17; in general improved. He still has 3 small open areas on the lateral aspect of his left leg however most of the area on the posterior part of his leg is epithelialized. He has better edema control. He has an ABD pad under his stocking on the right anterior lower leg although he did not let us look at that today. 03/24/17; patient arrives back in clinic today with no open areas however there are  areas on the posterior left calf and anterior left calf that are less than 100% epithelialized. His edema is well controlled in the left lower leg. There is some pitting edema probably lymphedema in the left upper thigh. He uses compression pumps at home once per day. I tried to get him to do this twice a day although he is very reticent. 04/01/2017 -- for the last 2 days he's had significant redness, tenderness and weeping and came in for an urgent visit today. 04/07/17; patient still has 6 more days of doxycycline. He was seen by Dr. Meyer Russel last Wednesday for cellulitis involving the posterior aspect, lateral aspect of his Involving his heel. For the most part he is better there is less erythema and less weeping. He has been on his feet for 12 hours 2 over the weekend. Using his compression pumps once a day 04/14/17 arrives today with continued improvement. Only one area on the posterior left calf that is not fully epithelialized. He has intense bilateral venous inflammation associated with his chronic venous insufficiency disease and secondary lymphedema. We have been using silver alginate to the left posterior calf wound In passing he tells [place]  today that the right leg but we have not seen in quite some time has an open area on it but he doesn't want [place] to look at this today states he will show this to [place] next week. 04/21/17; there is no open area on his left leg although he still reports some weeping edema. He showed [place] his right leg today which is the first time we've seen this leg in a long time. He has a large area of open wound on the right leg anteriorly healthy granulation. Quite a bit of swelling in the right leg and some degree of venous inflammation. He told us about the right leg in passing last week but states that deterioration in the right leg really only happened over the weekend 04/28/17; there is no open area on the left leg although there is an irritated part on the posterior which is  like a wrap injury. The wound on the right leg which was new from last week at least to [place] is a lot better. 05/05/17; still no open area on the left leg. Patient is using his new compression stocking which seems to be doing a good job of controlling the edema. He states he is using his compression pumps once per day. The right leg still has an open wound although it is better in terms of surface area. Required debridement. A lot of pain in the posterior right Achilles marked tenderness. Usually this type of presentation this patient gives concern for an active cellulitis 05/12/17; patient arrives today with his major wound from last week on the right lateral leg somewhat better. Still requiring debridement. He was using his compression stocking on the left leg however that is reopened with superficial wounds anteriorly he did not have an open wound on this leg previously. He is still using his juxta light's once daily at night. He cannot find the time to do this in the morning as he has to be at work by 7 AM 05/19/17; right lateral leg wound looks improved. No debridement required. The concerning area is on the left posterior leg which appears to almost have a subcutaneous hemorrhagic component to it. We've been using silver alginate to all the wounds 05/26/17; the right lateral leg wound continues to look improved. However the area on the left posterior calf is a tightly adherent surface. [place] using silver alginate. Because of the weeping edema in his legs there is very little good alternatives. 06/02/17; the patient left here last week looking quite good. Major wound on the left posterior calf and a small one on the right lateral calf. Both of these look satisfactory. He tells me that by Wednesday he had noted increased pain in the left leg and drainage. He called on Thursday and Friday to get an appointment here but we were blocked. He did not go to urgent care or his primary physician. He thinks he  had a fever on Thursday but did not actually take his temperature. He has not been using his compression pumps on the left leg because of pain. I advised him to go to the emergency room today for IV antibiotics for stents of left leg cellulitis but he has refused I have asked him to take 2 days off work to keep his leg elevated and he has refused this as well. In view of this I'm going to call him and Augmentin and doxycycline. 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 However after a detailed discussion regarding the risk benefits and alternatives he refuses to get admitted to the hospital. With no other recourse I will continue him on oral antibiotics as before and hopefully he'll have his infectious disease consultation this week. 06/16/2017 -- the patient was seen today by the nurse practitioner at infectious disease Ms. Dixon. 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 He has also been advise Lotrimin cream locally. she also had wise IV antibiotics for his condition if it flares up 06/23/17; patient arrives today with drainage bilaterally although the remaining wound on the left posterior calf after cleaning up today "highlighter yellow drainage" did not look too bad. Unfortunately he has had breakdown on the right anterior leg [previously this leg had not been open and he is using a black stocking] he went to see infectious disease and is been put on clindamycin 150 mg daily, I did not verify the dose although I'm not familiar with using clindamycin in this dosing range, perhaps  for prophylaxisoo 06/27/17; I brought this patient back today to follow-up on the wound deterioration on the right lower leg together with surrounding cellulitis. I started him on doxycycline 4 days ago. This area looks better however he comes in today with intense cellulitis on the medial part of his left thigh. This is not have a wound in this area. Extremely tender. We've been using silver alginate to the wounds on the right lower leg left lower leg with bilateral 4 layer compression he is using his external compression pumps once a day 07/04/17; patient's left medial thigh cellulitis looks better. He has not been using his compression pumps as his insert said it was contraindicated with cellulitis. His right leg continues to make improvements all the wounds are still open. We only have one remaining wound on the left posterior calf. Using silver alginate to all open areas. He is on doxycycline which I started a week ago and should be finishing I gave him Augmentin after Thursday's visit for the severe cellulitis on the left medial thigh which fortunately looks better 07/14/17; the patient's left medial thigh cellulitis has resolved. The cellulitis in his right lower calf on the right also looks better. All of his wounds are stable to improved we've been using silver alginate he has completed the antibiotics I have given him. He has clindamycin 150 mg once a day prescribed by infectious disease for prophylaxis, I've advised him to start this now. We have been using bilateral Unna boots over silver alginate to the wound areas 07/21/17; the patient is been to see infectious disease who noted his recurrent problems with cellulitis. He was not able to tolerate prophylactic clindamycin therefore he is on amoxicillin 500 twice a day. He also had a second daily dose of Lasix added By Dr. Oneta Rack but he is not taking this. Nor is he being completely compliant with his compression pumps a especially not this  week. He has 2 remaining wounds one on the right posterior lateral lower leg and one on the left posterior medial lower leg. 07/28/17; maintain on Amoxil 500 twice a day  as prophylaxis for recurrent cellulitis as ordered by infectious disease. The patient has Unna boots bilaterally. 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 He has bilateral Unna boots for compression and silver alginate to his wounds. Arrives today with his legs looking as good as I have seen him in quite some time. Not surprisingly his wounds look better as well with improvement on the right lateral leg venous insufficiency wound and also the left medial leg. He is still using the compression pumps once a day 08/11/17; both legs appear to be doing better wounds on the right lateral and left medial legs look better. Skin on the right leg quite good. He is been using silver alginate as the primary dressing. I'm going to use Anasept gel calcium alginate and maintain all the secondary dressings 08/18/17; the patient continues to actually do quite well. The area on his right lateral leg is just about closed the left medial also looks better although it is still moist in this area. His edema is well controlled we have been using Anasept gel with calcium alginate and the usual secondary dressings, 4 layer compression and once daily use of his compression pumps "always been able to manage 09/01/17; the patient continues to do reasonably well in spite of his trip to [place]. The area on the right lateral leg is epithelialized. Left is much better but still open. He has more edema and more chronic erythema on the left leg [venous inflammation] 09/08/17; he arrives today with no open wound on the right lateral leg and decently controlled edema. Unfortunately his left leg is not nearly as in his  good situation as last week.he apparently had increasing edema starting on Saturday. He edema soaked through into his foot so used a plastic bag to walk around his home. The area on the medial right leg which was his open area is about the same however he has lost surface epithelium on the left lateral which is new and he has significant pain in the Achilles area of the left foot. He is already on amoxicillin chronically for prophylaxis of cellulitis in the left leg 09/15/17; he is completed a week of doxycycline and the cellulitis in the left posterior leg and Achilles area is as usual improved. He still has a lot of edema and fluid soaking through his dressings. There is no open wound on the right leg. He saw infectious disease NP today 09/22/17;As usual 1 we transition him from our compression wraps to his stockings things did not go well. He has several small open areas on the right leg. He states this was caused by the compression wrap on his skin although he did not wear this with the stockings over them. He has several superficial areas on the left leg medially laterally posteriorly. He does not have any evidence of active cellulitis especially involving the left Achilles The patient is traveling from [facility] Saturday going to [facility]. He states he isn't attempting to get an appointment with a heel objects wound center there to change his dressings. I am not completely certain whether this will work 10/06/17; the patient came in on Friday for a nurse visit and the nurse reported that his legs actually look quite good. He arrives in clinic today for his regular follow-up visit. He has a new wound on his left third  toe over the PIP probably caused by friction with his footwear. He has small areas on the left leg and a very superficial but epithelialized area on the right anterior lateral lower leg. Other than that his legs look as good as I've seen him in quite some time. We have been using  silver alginate Review of systems; no chest pain no shortness of breath other than this a 10 point review of systems negative 10/20/17; seen by Dr. Meyer Russel last week. He had taken some antibiotics [doxycycline] that he had left over. Dr. Meyer Russel thought he had candida infection and declined to give him further antibiotics. He has a small wound remaining on the right lateral leg several areas on the left leg including a larger area on the left posterior several left medial and anterior and a small wound on the left lateral. The area on the left dorsal third toe looks a lot better. ROS; Gen.; no fever, respiratory no cough no sputum Cardiac no chest pain other than this 10 point review of system is negative 10/30/17; patient arrives today having fallen in the bathtub 3 days ago. It took him a while to get up. He has pain and maceration in the wounds on his left leg which have deteriorated. He has not been using his pumps he also has some maceration on the right lateral leg. 11/03/17; patient continues to have weeping edema especially in the left leg. This saturates his dressings which were just put on on 12/27. As usual the doxycycline seems to take care of the cellulitis on his lower leg. He is not complaining of fever, chills, or other systemic symptoms. He states his leg feels a lot better on the doxycycline I gave him empirically. He also apparently gets injections at his primary doctor's officeo Rocephin for cellulitis prophylaxis. I didn't ask him about his compression pump compliance today I think that's probably marginal. Arrives in the clinic with all of his dressings primary and secondary macerated full of fluid and he has bilateral edema 11/10/17; the patient's right leg looks some better although there is still a cluster of wounds on the right lateral. The left leg is inflamed with almost circumferential skin loss medially to laterally although we are still maintaining anteriorly. He does not  have overt cellulitis there is a lot of drainage. He is not using compression pumps. We have been using silver alginate to the wound areas, there are not a lot of options here 11/17/17; the patient's right leg continues to be stable although there is still open wounds, better than last week. The inflammation in the left leg is better. Still loss of surface layer epithelium especially posteriorly. There is no overt cellulitis in the amount of edema and his left leg is really quite good, tells me he is using his compression pumps once a day. 11/24/17; patient's right leg has a small superficial wound laterally this continues to improve. The inflammation in the left leg is still improving however we have continuous surface layer epithelial loss posteriorly. There is no overt cellulitis in the amount of edema in both legs is really quite good. He states he is using his compression pumps on the left leg once a day for 5 out of 7 days 12/01/17; very small superficial areas on the right lateral leg continue to improve. Edema control in both legs is better today. He has continued loss of surface epithelialization and left posterior calf although I think this is better. We have been using silver  alginate with large number of absorptive secondary dressings 4 layer on the left Unna boot on the right at his request. He tells me he is using his compression pumps once a day 12/08/17; he has no open area on the right leg is edema control is good here. On the left leg however he has marked erythema and tenderness breakdown of skin. He has what appears to be a wrap injury just distal to the popliteal fossa. 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 There was swelling erythema and breakdown in the right leg. He has much worse skin breakdown in the left leg as well multiple open areas medially and posteriorly as well as laterally. He tells me he has been using his compression pumps but tells me he feels that the drainage out of his leg is worse when he uses a compression pumps. T be fair to him he is been saying this o for a while however I don't know that I have really been listening to this. I wonder if the compression pumps are working properly 12/22/17;. Once again he arrives with severe erythema, weeping edema from the left greater than right leg. Noncompliance with compression pumps. New this visit he is complaining of pain on the lateral aspect of the right leg and the medial aspect of his right thigh. He apparently saw his cardiologist Dr. [name] who was ordered an echocardiogram area and I think this is a step in the right direction 12/25/17; started his doxycycline Monday night. There is still intense erythema of the right leg especially in the anterior thigh although there is less tenderness. The erythema around the wound on the right lateral calf also is less tender. He still complaining of pain in the left heel. His wounds are about the same right lateral left medial left lateral. Superficial but certainly not close to closure. He denies being systemically unwell no fever chills no abdominal pain no diarrhea 12/29/17; back in follow-up of his extensive right calf and right thigh cellulitis. I added amoxicillin to cover possible doxycycline resistant strep. This seems to of done the trick he is in much less pain there is much less erythema and swelling. He has his echocardiogram at 11:00 this morning. X-ray of the left heel was also negative. 01/05/18; the patient arrived with his edema under much better control. Now that he is retired he is able to use his compression pumps daily and sometimes twice a day per the patient. He has a  wound on the right leg the lateral wound looks better. Area on the left leg also looks a lot better. He has no evidence of cellulitis in his bilateral thighs I had a quick peak at his echocardiogram. He is in normal ejection fraction and normal left ventricular function. He has moderate pulmonary hypertension moderately reduced right ventricular function. One would have to wonder about chronic sleep apnea although he says he doesn't snore. He'll review the echocardiogram with his cardiologist. 01/12/18; the patient arrives with the edema in both legs under exemplary control. He is using his compression pumps daily and sometimes twice daily. His wound on the right lateral leg is just about closed. He still has some weeping areas on the posterior left  calf and lateral left calf although everything is just about closed here as well. I have spoken with Aldean Baker who is the patient's nurse practitioner and infectious disease. She was concerned that the patient had not understood that the parenteral penicillin injections he was receiving for cellulitis prophylaxis was actually benefiting him. I don't think the patient actually saw that I would tend to agree we were certainly dealing with less infections although he had a serious one last month. 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 He is healed. He'll be placed in juxtalite compression wraps and increase his lymphedema pumps to twice daily. We will follow up again next week to ensure there are no issues with the new regiment. 01/20/18-he is here for evaluation of bilateral lower extremity weeping edema. Yesterday he was placed in compression wrap to the right lower extremity and compression stocking to left lower shrubbery. He states he uses lymphedema pumps last night and again this morning and noted a blister to the left lower extremity. On exam he was noted to have drainage to the right lower extremity. He  will be placed in Unna boots bilaterally and follow-up next week 01/26/18; patient was actually discharged a week ago to his own juxta light stockings only to return the next day with bilateral lower extremity weeping edema.he was placed in bilateral Unna boots. He arrives today with pain in the back of his left leg. There is no open area on the right leg however there is a linear/wrap injury on the left leg and weeping edema on the left leg posteriorly. I spoke with infectious disease about 10 days ago. They were disappointed that the patient elected to discontinue prophylactic intramuscular penicillin shots as they felt it was particularly beneficial in reducing the frequency of his cellulitis. I discussed this with the patient today. He does not share this view. He'll definitely need antibiotics today. Finally he is traveling to [place] and trauma leaving this Saturday and returning a week later and he does not travel with his pumps. He is going by car 01/30/18; patient was seen 4 days ago and brought back in today for review of cellulitis in the left leg posteriorly. I put him on amoxicillin this really hasn't helped as much as I might like. He is also worried because he is traveling to [facility] trauma by car. Finally we will be rewrapping him. There is no open area on the right leg over his left leg has multiple weeping areas as usual 02/09/18; The same wrap on for 10 days. He did not pick up the last doxycycline I prescribed for him. He apparently took 4 days worth he already had. There is nothing open on his right leg and the edema control is really quite good. He's had damage in the left leg medially and laterally especially probably related to the prolonged use of Unna boots 02/12/18; the patient arrived in clinic today for a nurse visit/wrap change. He complained of a lot of pain in the left posterior calf. He is taking doxycycline that I previously prescribed for him. Unfortunately even though  he used his stockings and apparently used to compression pumps twice a day he has weeping edema coming out of the lateral part of his right leg. This is coming from the lower anterior lateral skin area. 02/16/18; the patient has finished his doxycycline and will finish the amoxicillin 2 days. The area of cellulitis in the left calf posteriorly has resolved. He is  no longer having any pain. He tells me he is using his compression pumps at least once a day sometimes twice. 02/23/18; the patient finished his doxycycline and Amoxil last week. On Friday he noticed a small erythematous circle about the size of a quarter on the left lower leg just above his ankle. This rapidly expanded and he now has erythema on the lateral and posterior part of the thigh. This is bright red. Also has an area on the dorsal foot just above his toes and a tender area just below the left popliteal fossa. He came off his prophylactic penicillin injections at his own insistence one or 2 months ago. This is obviously deteriorated since then 03/02/18; patient is on doxycycline and Amoxil. Culture I did last week of the weeping area on the back of his left calf grew group B strep. I have therefore renewed the amoxicillin 500 3 times a day for a further week. He has not been systemically unwell. Still complaining of an area of discomfort right under his left popliteal fossa. There is no open wound on the right leg. He tells me that he is using his pumps twice a day on most days 03/09/18; patient arrives in clinic today completing his amoxicillin today. The cellulitis on his left leg is better. Furthermore he tells me that he had intramuscular penicillin shots that his primary care office today. However he also states that the wrap on his right leg fell down shortly after leaving clinic last week. He developed a large blister that was present when he came in for a nurse visit later in the week and then he developed intense discomfort around  this area.He tells me he is using his compression pumps 03/16/18; the patient has completed his doxycycline. The infectious part of this/cellulitis in the left heel area left popliteal area is a lot better. He has 2 open areas on the right calf. Still areas on the left calf but this is a lot better as well. 03/24/18; the patient arrives complaining of pain in the left popliteal area again. He thinks some of this is wrap injury. He has no open area on the right leg and really no open area on the left calf either except for the popliteal area. He claims to be compliant with the compression pumps 03/31/18; I gave him doxycycline last week because of cellulitis in the left popliteal area. This is a lot better although the surface epithelium is denuded off and response to this. He arrives today with uncontrolled edema in the right calf area as well as a fingernail injury in the right lateral calf. There is only a few open areas on the left 04/06/18; I gave him amoxicillin doxycycline over the last 2 weeks that the amoxicillin should be completing currently. He is not complaining of any pain or systemic symptoms. The only open areas see has is on the right lateral lower leg paradoxically I cannot see anything on the left lower leg. He tells me he is using his compression pumps twice a day on most days. Silver alginate to the wounds that are open under 4 layer compression 04/13/18; he completed antibiotics and has no new complaints. Using his compression pumps. Silver alginate that anything that's opened 04/20/18; he is using his compression pumps religiously. Silver alginate 4 layer compression anything that's opened. He comes in today with no open wounds on the left leg but 3 on the right including a new one posteriorly. He has 2 on the right  lateral and one on the right posterior. He likes Unna boots on the right leg for reasons that aren't really clear we had the usual 4 layer compression on the left. It may be  necessary to move to the 4 layer compression on the right however for now I left them in the Unna boots 04/27/18; he is using his compression pumps at least once a day. He has still the wounds on the right lateral calf. The area right posteriorly has closed. He does not have an open wound on the left under 4 layer compression however on the dorsal left foot just proximal to the toes and the left third toe 2 small open areas were identified 05/11/18; he has not uses compression pumps. The areas on the right lateral calf have coalesced into one large wound necrotic surface. On the left side he has one small wound anteriorly however the edema is now weeping out of a large part of his left leg. He says he wasn't using his pumps because of the weeping fluid. I explained to him that this is the time he needs to pump more 05/18/18; patient states he is using his compression pumps twice a day. The area on the right lateral large wound albeit superficial. On the left side he has innumerable number of small new wounds on the left calf particularly laterally but several anteriorly and medially. All these appear to have healthy granulated base these look like the remnants of blisters however they occurred under compression. The patient arrives in clinic today with his legs somewhat better. There is certainly less edema, less multiple open areas on the left calf and the right anterior leg looks somewhat better as well superficial and a little smaller. However he relates pain and erythema over the last 3-4 days in the thigh and I looked at this today. He has not been systemically unwell no fever no chills no change in blood sugar values 05/25/18; comes in today in a better state. The severe cellulitis on his left leg seems better with the Keflex. Not as tender. He has not been systemically unwell Hard to find an open wound on the left lower leg using his compression pumps twice a day The confluent wounds on his right  lateral calf somewhat better looking. These will ultimately need debridement I didn't do this today. 06/01/18; the severe cellulitis on the left anterior thigh has resolved and he is completed his Keflex. There is no open wound on the left leg however there is a superficial excoriation at the base of the third toe dorsally. Skin on the bottom of his left foot is macerated looking. The left the wounds on the lateral right leg actually looks some better although he did require debridement of the top half of this wound area with an open curet 06/09/18 on evaluation today patient appears to be doing poorly in regard to his right lower extremity in particular this appears to likely be infected he has very thick purulent discharge along with a bright green tent to the discharge. This makes me concerned about the possibility of pseudomonas. He's also having increased discomfort at this point on evaluation. Fortunately there does not appear to be any evidence of infection spreading to the other location at this time. 06/16/18 on evaluation today patient appears to actually be doing fairly well. His ulcer has actually diminished in size quite significantly at this point which is good news. Nonetheless he still does have some evidence of infection he did  see infectious disease this morning before coming here for his appointment. I did review the results of their evaluation and their note today. They did actually have him discontinue the Cipro and initiate treatment with linezolid at this time. He is doing this for the next seven days and they recommended a follow-up in four months with them. He is the keep a log of the need for intermittent antibiotic therapy between now and when he falls back up with infectious disease. This will help them gaze what exactly they need to do to try and help them out. 06/23/18; the patient arrives today with no open wounds on the left leg and left third toe healed. He is been using his  compression pumps twice a day. On the right lateral leg he still has a sizable wound but this is a lot better than last time I saw this. 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 Has been using silver alginate under 4 layer compression 06/30/18; the only open wound he has is on the right lateral leg and this looks healthy. No debridement is required. We have been using silver alginate. He does not have an open wound on the left leg. There is apparently some drainage from the dorsal proximal third toe on the left although I see no open wound here. 07/03/18 on evaluation today patient was actually here just for a nurse visit rapid change. However when he was here on Wednesday for his rat change due to having been healed on the left and then developing blisters we initiated the wrap again knowing that he would be back today for [place] to reevaluate and see were at. Unfortunately he has developed some cellulitis into the proximal portion of his right lower extremity even into the region of his thigh. 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 He was placed on one as what at that point. Nonetheless he is done with that and has been tolerating it well otherwise. Doxycycline which in the past really did not seem to be effective for him. Nonetheless I think the best option may be for [place] to definitely reinitiate the antibiotics for a longer period of time. 07/07/18; since I last saw this patient a week ago he has had a difficult time. At that point he did not have an open wound on his left leg. We transitioned him into juxta light stockings. He was apparently in the clinic the next day with blisters on the left lateral and left medial lower calf. He also had weeping edema fluid. He was put back into a compression wrap. He was also in the clinic on Friday with intense  erythema in his right thigh. Per the patient he was started on Bactrim however that didn't work at all in terms of relieving his pain and swelling. He has taken 3 doxycycline that he had left over from last time and that seems to of helped. He has blistering on the right thigh as well. 07/14/18; the erythema on his right thigh has gotten better with doxycycline that he is finishing. 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 Presumably this cellulitis in the thigh was not related to that although I think this is a bit concerning going forward. He still has an area on  the right lateral calf the blister on the right medial calf just below the knee that was discussed above. On the left 2 small open areas left medial and left lateral. Edema control is adequate. He is using his compression pumps twice a day 07/20/18; continued improvement in the condition of both legs especially the edema in his bilateral thighs. He tells me he is been losing weight through a combination of diet and exercise. He is using his compression pumps twice a day. So overall she made to the remaining wounds 07/27/2018; continued improvement in condition of both legs. His edema is well controlled. The area on the right lateral leg is just about closed he had one blisters show up on the medial left upper calf. We have him in 4 layer compression. He is going on a 10-day trip to [place], [place] and [place]. He will be driving. He wants to wear Unna boots because of the lessening amount of constriction. He will not use compression pumps while he is away 08/05/18 on evaluation today patient actually appears to be doing decently well all things considered in regard to his bilateral lower extremities. The worst ulcer is actually only posterior aspect of his left lower extremity with a four layer compression wrap cut into his leg a couple weeks back. He did have a  trip and actually had [employer] for the trip that he is worn since he was last here. Nonetheless he feels like the [employer] actually do better for him his swelling is up a little bit but he also with his trip was not taking his Lasix on a regular set schedule like he was supposed to be. He states that obviously the reason being that he cannot drive and keep going without having to urinate too frequently which makes it difficult. He did not have his pumps with him while he was away either which I think also maybe playing a role here too. 08/13/2018; the patient only has a small open wound on the right lateral calf which is a big improvement in the last month or 2. He also has the area posteriorly just below the posterior fossa on the left which I think was a wrap injury from several weeks ago. He has no current evidence of cellulitis. He tells me he is back into his compression pumps twice a day. He also tells me that while he was at the laundromat somebody stole a section of his extremitease stockings 08/20/2018; back in the clinic with a much improved state. He only has small areas on the right lateral mid calf which is just about healed. This was is more substantial area for quite a prolonged period of time. He has a small open area on the left anterior tibia. The area on the posterior calf just below the popliteal fossa is closed today. He is using his compression pumps twice a day 08/28/2018; patient has no open wound on the right leg. He has a smattering of open areas on the calf with some weeping lymphedema. More problematically than that it looks as though his wraps of slipped down in his usual he has very angry upper area of edema just below the right medial knee and on the right lateral calf. He has no open area on his feet. The patient is traveling to [facility] next week. I will send him in an antibiotic. We will continue to wrap the right leg. We ordered extremitease  stockings for him last  week and I plan to transition the right leg to a stocking when he gets home which will be in 10 days time. As usual he is very reluctant to take his pumps with him when he travels 09/07/2018; patient returns from [facility]. He shows me a picture of his left leg in the mid part of his trip last week with intense fire engine erythema. The picture look bad enough I would have considered sending him to the hospital. Instead he went to the wound care center in [facility] They did not prescribe him antibiotics but he did take some doxycycline he had leftover from a previous visit. I had given him trimethoprim sulfamethoxazole before he left this did not work according to the patient. This is resulted in some improvement fortunately. He comes back with a large wound on the left posterior calf. Smaller area on the left anterior tibia. Denuded blisters on the dorsal left foot over his toes. Does not have much in the way of wounds on the right leg although he does have a very tender area on the right posterior area just below the popliteal fossa also suggestive of infection. He promises me he is back on his pumps twice a day 09/15/2018; the intense cellulitis in his left lower calf is a lot better. The wound area on the posterior left calf is also so better. However he has reasonably extensive wounds on the dorsal aspect of his second and third toes and the proximal foot just at the base of the toes. There is nothing open on the right leg 09/22/2018; the patient has excellent edema control in his legs bilaterally. He is using his external compression pumps twice a day. He has no open area on the right leg and only the areas in the left foot dorsally second and third toe area on the left side. He does not have any signs of active cellulitis. 10/06/2018; the patient has good edema control bilaterally. He has no open wound on the right leg. There is a blister in the posterior  aspect of his left calf that we had to deal with today. He is using his compression pumps twice a day. There is no signs of active cellulitis. We have been using silver alginate to the wound areas. He still has vulnerable areas on the base of his left first second toes dorsally He has a his extremities stockings and we are going to transition him today into the stocking on the right leg. He is cautioned that he will need to continue to use the compression pumps twice a day. If he notices uncontrolled edema in the right leg he may need to go to 3 times a day. 10/13/2018; the patient came in for a nurse check on Friday he has a large flaccid blister on the right medial calf just below the knee. We unroofed this. He has this and a new area underneath the posterior mid calf which was undoubtedly a blister as well. He also has several small areas on the right which is the area we put his extremities stocking on. 10/19/2018; the patient went to see infectious disease this morning I am not sure if that was a routine follow-up in any case the doxycycline I had given him was discontinued and started on linezolid. He has not started this. It is easy to look at his left calf and the inflammation and think this is cellulitis however he is very tender in the tissue just below the popliteal  fossa and I have no doubt that there is infection going on here. He states the problem he is having is that with the compression pumps the edema goes down and then starts walking the wrap falls down. We will see if we can adhere this. He has 1 or 2 minuscule open areas on the right still areas that are weeping on the posterior left calf, the base of his left second and third toes 10/26/18; back today in clinic with quite of skin breakdown in his left anterior leg. This may have been infection the area below the popliteal fossa seems a lot better however tremendous epithelial loss on the left anterior mid tibia area over quite  inexpensive tissue. He has 2 blisters on the right side but no other open wound here. 10/29/2018; came in urgently to see [place] today and we worked him in for review. He states that the 4 layer compression on the right leg caused pain he had to cut it down to roughly his mid calf this caused swelling above the wrap and he has blisters and skin breakdown today. As a result of the pain he has not been using his pumps. Both legs are a lot more edematous and there is a lot of weeping fluid. 11/02/18; arrives in clinic with continued difficulties in the right leg> left. Leg is swollen and painful. multiple skin blisters and new open areas especially laterally. He has not been using his pumps on the right leg. 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 He is not systemically unwell. 11/09/2018; the patient claims he is being compliant with his pumps. He is finished the doxycycline I gave him last week. Culture I did of the wound on the right lateral leg showed a few very resistant methicillin staph aureus. This was resistant to doxycycline. Nevertheless he states the pain in the leg is a lot better which makes me wonder if the cultured organism was not really what was causing the problem nevertheless this is a very dangerous organism to be culturing out of any wound. His right leg is still a lot larger than the left. He is using an Radio broadcast assistant on this area, he blames a 4-layer compression for causing the original skin breakdown which I doubt is true however I cannot talk him out of it. We have been using silver alginate to all of these areas which were initially blisters 11/16/2018; patient is being compliant with his external compression pumps at twice a day. Miraculously he arrives in clinic today with absolutely no open wounds. He has better edema control on the left where he has been using 4 layer compression versus wound of wounds on the right and I pointed this  out to him. There is no inflammation in the skin in his lower legs which is also somewhat unusual for him. There is no open wounds on the dorsal left foot. He has extremitease stockings at home and I have asked him to bring these in next week. 11/25/18 patient's lower extremity on examination today on the left appears for the most part to be wound free. He does have an open wound on the lateral aspect of the right lower extremity but this is minimal compared to what I've seen in past. He does request that we go ahead and wrap the left leg as well even though there's nothing open just so hopefully it will not reopen in short order. 1/28; patient has superficial open wounds  on the right lateral calf left anterior calf and left posterior calf. His edema control is adequate. 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 We have been using silver alginate as the primary dressing. He claims compliance with his compression pumps 2/4; patient has superficial open wounds on numerous areas of his left calf and again one on the left dorsal foot. The areas on the right lateral calf have healed. The cellulitis that I gave him doxycycline for last week is also resolved this was mostly on the left anterior calf just below the tibial tuberosity. His edema looks fairly well-controlled. He tells me he went to see his primary doctor today and had blood work ordered 2/11; once again he has several open areas on the left calf left tibial area. Most of these are small and appear to have healthy granulation. He does not have anything open on the right. The edema and control in his thighs is pretty good which is usually a good indication he has been using his pumps as requested. 2/18; he continues to have several small areas on the left calf and left tibial area. Most of these are small healthy granulation. We put him in his stocking on the  right leg last week and he arrives with a superficial open area over the right upper tibia and a fairly large area on the right lateral tibia in similar condition. His edema control actually does not look too bad, he claims to be using his compression pumps twice a day 2/25. Continued small areas on the left calf and left tibial area. New areas especially on the right are identified just below the tibial tuberosity and on the right upper tibia itself. There are also areas of weeping edema fluid even without an obvious wound. He does not have a considerable degree of lymphedema but clearly there is more edema here than his skin can handle. He states he is using the pumps twice a day. We have an Unna boot on the right and 4 layer compression on the left. 3/3; he continues to have an area on the right lateral calf and right posterior calf just below the popliteal fossa. There is a fair amount of tenderness around the wound on the popliteal fossa but I did not see any evidence of cellulitis, could just be that the wrap came down and rubbed in this area. He does not have an open area on the left leg however there is an area on the left dorsal foot at the base of the third toe We have been using silver alginate to all wound areas 3/10; he did not have an open area on his left leg last time he was here a week ago. T oday he arrives with a horizontal wound just below the tibial tuberosity and an area on the left lateral calf. He has intense erythema and tenderness in this area. The area is on the right lateral calf and right posterior calf better than last week. We have been using silver alginate as usual 3/18 - Patient returns with 3 small open areas on left calf, and 1 small open area on right calf, the skin looks ok with no significant erythema, he continues the UNA boot on right and 4 layer compression on left. The right lateral calf wound is closed , the right posterior is small area. we will continue  silver alginate to the areas. Culture results  from right posterior calf wound is + MRSA sensitive to Bactrim but resistant to DOXY 01/27/19 on evaluation today patient's bilateral lower extremities actually appear to be doing fairly well at this point which is good news. He is been tolerating the dressing changes without complication. Fortunately she has made excellent improvement in regard to the overall status of his wounds. Unfortunately every time we cease wrapping him he ends up reopening in causing more significant issues at that point. Again I'm unsure of the best direction to take although I think the lymphedema clinic may be appropriate for him. 02/03/19 on evaluation today patient appears to be doing well in regard to the wounds that we saw him for last week unfortunately he has a new area on the proximal portion of his right medial/posterior lower extremity where the wrap somewhat slowed down and caused swelling and a blister to rub and open. Unfortunately this is the only opening that he has on either leg at this point. 02/17/19 on evaluation today patient's bilateral lower extremities appear to be doing well. He still completely healed in regard to the left lower extremity. In regard to the right lower extremity the area where the wrap and slid down and caused the blister still seems to be slightly open although this is dramatically better than during the last evaluation two weeks ago. I'm very pleased with the way this stands overall. 03/03/19 on evaluation today patient appears to be doing well in regard to his right lower extremity in general although he did have a new blister open this does not appear to be showing any evidence of active infection at this time. Fortunately there's No fevers, chills, nausea, or vomiting noted at this time. Overall I feel like he is making good progress it does feel like that the right leg will we perform the [employer] seems to do with a bit better than three  layer wrap on the left which slid down on him. We may switch to doing bilateral in the book wraps. 5/4; I have not seen Mr. Dicker in quite some time. According to our case manager he did not have an open wound on his left leg last week. He had 1 remaining wound on the right posterior medial calf. He arrives today with multiple openings on the left leg probably were blisters and/or wrap injuries from Unna boots. I do not think the Unna boot's will provide adequate compression on the left. I am also not clear about the frequency he is using the compression pumps. 03/17/19 on evaluation today patient appears to be doing excellent in regard to his lower extremities compared to last week's evaluation apparently. He had gotten significantly worse last week which is unfortunate. The [employer] wrap on the left did not seem to do very well for him at all and in fact it didn't control his swelling significantly enough he had an additional outbreak. Subsequently we go back to the four layer compression wrap on the left. This is good news. At least in that he is doing better and the wound seem to be killing him. He still has not heard anything from the lymphedema clinic. 03/24/19 on evaluation today patient actually appears to be doing much better in regard to his bilateral lower [place] as compared to last week when I saw him. Fortunately there's no signs of active infection at this time. He has been tolerating the dressing changes without complication. Overall I'm extremely pleased with the progress and appearance in general.  04/07/19 on evaluation today patient appears to be doing well in regard to his bilateral lower extremities. His swelling is significantly down from where it was previous. With that being said he does have a couple blisters still open at this point but fortunately nothing that seems to be too severe and again the majority of the larger openings has healed at this time. 04/14/19 on evaluation  today patient actually appears to be doing quite well in regard to his bilateral lower extremities in fact I'm not even sure there's anything significantly open at this time at any site. Nonetheless he did have some trouble with these wraps where they are somewhat irritating him secondary to the fact that he has noted that the graph wasn't too close down to the end of this foot in a little bit short as well up to his knee. Otherwise things seem to be doing quite well. 04/21/19 upon evaluation today patient's wound bed actually showed evidence of being completely healed in regard to both lower extremities which is excellent news. There does not appear to be any signs of active infection which is also good news. I'm very pleased in this regard. No fevers, chills, nausea, or vomiting noted at this time. 04/28/19 on evaluation today patient appears to be doing a little bit worse in regard to both lower extremities on the left mainly due to the fact that when he went infection disease the wrap was not wrapped quite high enough he developed a blister above this. On the right he is a small open area of nothing too significant but again this is continuing to give him some trouble he has been were in the Velcro compression that he has at home. 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 He's been tolerating the dressing changes without complication. Fortunately there's no signs of active infection at this time. No fevers, chills, nausea, or vomiting noted at this time. We have been trying to get an appointment with her lymphedema clinic in [facility] but unfortunately nobody can get them on phone with not been able to even fax information over the patient likewise is not been able to get in touch with them. Overall I'm not sure exactly what's going on here with to reach out again today. 05/12/19 on evaluation today patient actually  appears to be doing about the same in regard to his bilateral lower Trinity ulcers. Still having a lot of drainage unfortunately. He tells me especially in the left but even on the right. There's no signs of active infection which is good news we've been using so ratcheted up to this point. 05/19/19 on evaluation today patient actually appears to be doing quite well with regard to his left lower extremity which is great news. Fortunately in regard to the right lower extremity has an issues with his wrap and he subsequently did remove this from what I'm understanding. Nonetheless long story short is what he had rewrapped once he removed it subsequently had maggots underneath this wrap whenever he came in for evaluation today. With that being said they were obviously completely cleaned away by the nursing staff. The visit today which is excellent news. However he does appear to potentially have some infection around the right ankle region where the maggots were located as well. He will likely require anabiotic therapy today. 05/26/19 on evaluation today patient actually appears to be doing much better in regard to his bilateral lower  extremities. I feel like the infection is under much better control. With that being said there were maggots noted when the wrap was removed yet again today. Again this could have potentially been left over from previous although at this time there does not appear to be any signs of significant drainage there was obviously on the wrap some drainage as well this contracted gnats or otherwise. Either way I do not see anything that appears to be doing worse in my pinion and in fact I think his drainage has slowed down quite significantly likely mainly due to the fact to his infection being under better control. 06/02/2019 on evaluation today patient actually appears to be doing well with regard to his bilateral lower extremities there is no signs of active infection at this time  which is great news. With that being said he does have several open areas more so on the right than the left but nonetheless these are all significantly better than previously noted. 06/09/2019 on evaluation today patient actually appears to be doing well. His wrap stayed up and he did not cause any problems he had more drainage on the right compared to the left but overall I do not see any major issues at this time which is great news. 06/16/2019 on evaluation today patient appears to be doing excellent with regard to his lower extremities the only area that is open is a new blister that can have opened as of today on the medial ankle on the left. Other than this he really seems to be doing great I see no major issues at this point. 06/23/2019 on evaluation today patient appears to be doing quite well with regard to his bilateral lower extremities. In fact he actually appears to be almost completely healed there is a small area of weeping noted of the right lower extremity just above the ankle. Nonetheless fortunately there is no signs of active infection at this time which is good news. No fevers, chills, nausea, vomiting, or diarrhea. 8/24; the patient arrived for a nurse visit today but complained of very significant pain in the left leg and therefore I was asked to look at this. Noted that he did not have an open area on the left leg last week nevertheless this was wrapped. The patient states that he is not been able to put his compression pumps on the left leg because of the discomfort. He has not been systemically unwell 06/30/2019 on evaluation today patient unfortunately despite being excellent last week is doing much worse with regard to his left lower extremity today. In fact he had to come in for a nurse on Monday where his left leg had to be rewrapped due to excessive weeping Dr. Leanord Hawking placed him on doxycycline at that point. Fortunately there is no signs of active infection Systemically at  this time which is good news. 07/07/2019 in regard to the patient's wounds today he actually seems to be doing well with his right lower extremity there really is nothing open or draining at this point this is great news. Unfortunately the left lower extremity is given him additional trouble at this time. There does not appear to be any signs of active infection nonetheless he does have a lot of edema and swelling noted at this point as well as blistering all of which has led to a much more poor appearing leg at this time compared to where it was 2 weeks ago when it was almost completely healed. Obviously this is  a little discouraging for the patient. He is try to contact the lymphedema clinic in [place] he has not been able to get through to them. 07/14/2019 on evaluation today patient actually appears to be doing slightly better with regard to his left lower extremity ulcers. Overall I do feel like at least at the top of the wrap that we have been placing this area has healed quite nicely and looks much better. The remainder of the leg is showing signs of improvement. Unfortunately in the thigh area he still has an open region on the left and again on the right he has been utilizing just a Band-Aid on an area that also opened on the thigh. Again this is an area that were not able to wrap although we did do an Ace wrap to provide some compression that something that obviously is a little less effective than the compression wraps we have been using on the lower portion of the leg. He does have an appointment with the lymphedema clinic in [facility] on Friday. 07/21/2019 on evaluation today patient appears to be doing better with regard to his lower extremity ulcers. He has been tolerating the dressing changes without complication. Fortunately there is no signs of active infection at this time. No fevers, chills, nausea, vomiting, or diarrhea. I did receive the paperwork from the physical therapist  at the lymphedema clinic in [place]. Subsequently I signed off on that this morning and sent that back to him for further progression with the treatment plan. 07/28/2019 on evaluation today patient appears to be doing very well with regard to his right lower extremity where I do not see any open wounds at this point. Fortunately he is feeling great as far as that is concerned as well. In regard to the left lower extremity he has been having issues with still several areas of weeping and edema although the upper leg is doing better his lower leg still I think is going require the compression wrap at this time. No fevers, chills, nausea, vomiting, or diarrhea. 08/04/2019 on evaluation today patient unfortunately is having new wounds on the right lower extremity. Again we have been using Unna boot wrap on that side. We switched him to using his juxta lite wrap at home. With that being said he tells me he has been using it although his legs extremely swollen and to be honest really does not appear that he has been. I cannot know that for sure however. Nonetheless he has multiple new wounds on the right lower extremity at this time. Obviously we will have to see about getting this rewrapped for him today. 08/11/2019 on evaluation today patient appears to be doing fairly well with regard to his wounds. He has been tolerating the dressing changes including the compression wraps without complication. He still has a lot of edema in his upper thigh regions bilaterally he is supposed to be seeing the lymphedema clinic on the 15th of this month once his wraps arrive for the upper part of his legs. 08/18/2019 on evaluation today patient appears to be doing well with regard to his bilateral lower extremities at this point. He has been tolerating the dressing changes without complication. Fortunately there is no signs of active infection which is also good news. He does have a couple weeping areas on the first  and second toe of the right foot he also has just a small area on the left foot upper leg and a small area on the left lower leg  but overall he is doing quite well in my opinion. He is supposed to be getting his wraps shortly in fact tomorrow and then subsequently is seeing the lymphedema clinic next Wednesday on the 21st. Of note he is also leaving on the 25th to go on vacation for a week to the beach. For that reason and since there is some uncertainty about what there can be doing at lymphedema clinic next Wednesday I am get a make an appointment for next Friday here for [place] to see what we need to do for him prior to him leaving for vacation. 10/23; patient arrives in considerable pain predominantly in the upper posterior calf just distal to the popliteal fossa also in the wound anteriorly above the major wound. This is probably cellulitis and he has had this recurrently in the past. He has no open wound on the right side and he has had an Radio broadcast assistant in that area. Finally I note that he has an area on the left posterior calf which by enlarge is mostly epithelialized. This protrudes beyond the borders of the surrounding skin in the setting of dry scaly skin and lymphedema. The patient is leaving for [facility] on Sunday. Per his longstanding pattern, he will not take his compression pumps with him predominantly out of fear that they will be stolen. He therefore asked that we put a Unna boot back on the right leg. He will also contact the wound care center in [facility] to see if they can change his dressing in the mid week. 11/3; patient returned from his vacation to [facility]. He was seen on 1 occasion at their wound care center. They did a 2 layer compression system as they did not have our 4-layer wrap. I am not completely certain what they put on the wounds. They did not change the Unna boot on the right. The patient is also seeing a lymphedema specialist physical therapist in  [place]. It appears that he has some compression sleeve for his thighs which indeed look quite a bit better than I am used to seeing. He pumps over these with his external compression pumps. 11/10; the patient has a new wound on the right medial thigh otherwise there is no open areas on the right. He has an area on the left leg posteriorly anteriorly and medially and an area over the left second toe. We have been using silver alginate. He thinks the injury on his thigh is secondary to friction from the compression sleeve he has. 11/17; the patient has a new wound on the right medial thigh last week. He thinks this is because he did not have a underlying stocking for his thigh juxta lite apparatus. He now has this. The area is fairly large and somewhat angry but I do not think he has underlying cellulitis. He has a intact blister on the right anterior tibial area. Small wound on the right great toe dorsally Small area on the medial left calf. 11/30; the patient does not have any open areas on his right leg and we did not take his juxta lite stocking off. However he states that on Friday his compression wrap fell down lodging around his upper mid calf area. As usual this creates a lot of problems for him. He called urgently today to be seen for a nurse visit however the nurse visit turned into a provider visit because of extreme erythema and pain in the left anterior tibia extending laterally and posteriorly. The area that  is problematic is extensive 10/06/2019 upon evaluation today patient actually appears to be doing poorly in regard to his left lower extremity. He Dr. Leanord Hawking did place him on doxycycline this past Monday apparently due to the fact that he was doing much worse in regard to this left leg. Fortunately the doxycycline does seem to be helping. Unfortunately we are still having a very difficult time getting his edema under any type of control in order to anticipate discharge at some  point. The only way were really able to control his lymphedema really is with compression wraps and that has only even seemingly temporary. He has been seeing a lymphedema clinic they are trying to help in this regard but still this has been somewhat frustrating in general for the patient. 10/13/19 on evaluation today patient appears to be doing excellent with regard to his right lower extremity as far as the wounds are concerned. His swelling is still quite extensive unfortunately. He is still having a lot of drainage from the thigh areas bilaterally which is unfortunate. He's been going to lymphedema clinic but again he still really does not have this edema under control as far as his lower extremities are concern. With regard to his left lower extremity this seems to be improving and I do believe the doxycycline has been of benefit for him. He is about to complete the doxycycline. 10/20/2019 on evaluation today patient appears to be doing poorly in regard to his bilateral lower extremities. More in the right thigh he has a lot of irritation at this site unfortunately. In regard to the left lower extremity the wrap was not quite as high it appears and does seem to have caused him some trouble as well. Fortunately there is no evidence of systemic infection though he does have some blue-green drainage which has me concerned for the possibility of Pseudomonas. He tells me he is previously taking Cipro without complications and he really does not care for Levaquin however due to some of the side effects he has. He is not allergic to any medications specifically antibiotics that were aware of. 10/27/2019 on evaluation today patient actually does appear to be for the most part doing better when compared to last week's evaluation. With that being said he still has multiple open wounds over the bilateral lower extremities. He actually forgot to start taking the Cipro and states that he still has the whole  bottle. He does have several new blisters on left lower extremity today I think I would recommend he go ahead and take the Cipro based on what I am seeing at this point. 12/30-Patient comes at 1 week visit, 4 layer compression wraps on the left and Unna boot on the right, primary dressing Xtrasorb and silver alginate. Patient is taking his Cipro and has a few more days left probably 5-6, and the legs are doing better. He states he is using his compressions devices which I believe he has 11/10/2019 on evaluation today patient actually appears to be much better than last time I saw him 2 weeks ago. His wounds are significantly improved and overall I am very pleased in this regard. Fortunately there is no signs of active infection at this time. He is just a couple of days away from completing Cipro. Overall his edema is much better he has been using his lymphedema pumps which I think is also helping at this point. 11/17/2019 on evaluation today patient appears to be doing excellent in regard to his wounds in  general. His legs are swollen but not nearly as much as they have been in the past. Fortunately he is tolerating the compression wraps without complication. No fevers, chills, nausea, vomiting, or diarrhea. He does have some erythema however in the distal portion of his right lower extremity specifically around the forefoot and toes there is a little bit of warmth here as well. 11/24/2019 on evaluation today patient appears to be doing well with regard to his right lower extremity I really do not see any open wounds at this point. His left lower extremity does have several open areas and his right medial thigh also is open. Other than this however overall the patient seems to be making good progress and I am very pleased at this point. 12/01/2019 on evaluation today patient appears to be doing poorly at this point in regard to his left lower extremity has several new blisters despite the fact that we  have him in compression wraps. In fact he had a 4-layer compression wrap, his upper thigh wrapped from lymphedema clinic, and a juxta light over top of the 4 layer compression wrap the lymphedema clinic applied and despite all this he still develop blisters underneath. Obviously this does have me concerned about the fact that unfortunately despite what we are doing to try to get wounds healed he continues to have new areas arise I do not think he is ever good to be at the point where he can realistically just use wraps at home to keep things under control. Typically when we heal him it takes about 1-2 days before he is back in the clinic with severe breakdown and blistering of his lower extremities bilaterally. This is happened numerous times in the past. Unfortunately I think that we may need some help as far as overall fluid overload to kind of limit what we are seeing and get things under better control. 12/08/2019 on evaluation today patient presents for follow-up concerning his ongoing bilateral lower extremity edema. Unfortunately he is still having quite a bit of swelling the compression wraps are controlling this to some degree but he did see Dr. [name] his cardiologist I do have that available for review today as far as the appointment was concerned that was on 12/06/2019. Obviously that she has been 2 days ago. The patient states that he is only been taking the Lasix 80 mg 1 time a day he had told me previously he was taking this twice a day. Nonetheless Dr. [name] recommended this be up to 80 mg 2 times a day for the patient as he did appear to be fluid overloaded. With that being said the patient states he did this yesterday and he was unable to go anywhere or do anything due to the fact that he was constantly having to urinate. Nonetheless I think that this is still good to be something that is important for him as far as trying to get his edema under control at all things that he is going to be  able to just expect his wounds to get under control and things to be better without going through at least a period of time where he is trying to stabilize his fluid management in general and I think increasing the Lasix is likely the first step here. It was also mentioned the possibility that the patient may require metolazone. With that being said he wanted to have the patient take Lasix twice a day first and then reevaluating 2 months to see where things  stand. 12/15/2019 upon evaluation today patient appears to be doing regard to his legs although his toes are showing some signs of weeping especially on the left at this point to some degree on the right. There does not appear to be any signs of active infection and overall I do feel like the compression wraps are doing well for him but he has not been able to take the Lasix at home and the increased dose that Dr. [name] recommended. He tells me that just not go to be feasible for him. Nonetheless I think in this case he should probably send a message to Dr. [name] in order to discuss options from the standpoint of possible admission to get the fluid off or otherwise going forward. 12/22/2019 upon evaluation today patient appears to be doing fairly well with regard to his lower extremities at this point. In fact he would be doing excellent if it was not for the fact that his right anterior thigh apparently had an allergic reaction to adhesive tape that he used. The wound itself that we have been monitoring actually appears to be healed. There is a lot of irritation at this point. 12/29/2019 upon evaluation today patient appears to be doing well in regard to his lower extremities. His left medial thigh is open and somewhat draining today but this is the only region that is open the right has done much better with the treatment utilizing the steroid cream that I prescribed for him last week. Overall I am pleased in that regard. Fortunately there is no  signs of active infection at this time. No fevers, chills, nausea, vomiting, or diarrhea. 01/05/2020 upon evaluation today patient appears to be doing more poorly in regard to his right lower extremity at this point upon evaluation today. Unfortunately he continues to have issues in this regard and I think the biggest issue is controlling his edema. This obviously is not very well controlled at this point is been recommended that he use the Lasix twice a day but he has not been able to do that. Unfortunately I think this is leading to an issue where honestly he is not really able to effectively control his edema and therefore the wounds really are not doing significantly better. I do not think that he is going to be able to keep things under good control unless he is able to control his edema much better. I discussed this again in great detail with him today. 01/12/2020 good news is patient actually appears to be doing quite well today at this point. He does have an appointment with lymphedema clinic tomorrow. His legs appear healed and the toe on the left is almost completely healed. In general I am very pleased with how things stand at this point. 01/19/2020 upon evaluation today patient appears to actually be doing well in regard to his lower extremities there is nothing open at this point. Fortunately he has done extremely well more recently. Has been seeing lymphedema clinic as well. With that being said he has Velcro wraps for his lower legs as well as his upper legs. The only wound really is on his toe which is the right great toe and this is barely anything even there. With all that being said I think it is good to be appropriate today to go ahead and switch him over to the Velcro compression wraps. 01/26/2020 upon evaluation today patient appears to be doing worse with regard to his lower extremities after last week switch him to  Velcro compression wraps. Unfortunately he lasted less than 24 hours  he did not have the sock portion of his Velcro wrap on the left leg and subsequently developed a blister underneath the Velcro portion. Obviously this is not good and not what we were looking for at this point. He states the lymphedema clinic did tell him to wear the wrap for 23 hours and take him off for 1 I am okay with that plan but again right now we got a get things back under control again he may have some cellulitis noted as well. 02/02/2020 upon evaluation today patient unfortunately appears to have several areas of blistering on his bilateral lower extremities today mainly on the feet. His legs do seem to be doing somewhat better which is good news. Fortunately there is no evidence of active infection at this time. No fevers, chills, nausea, vomiting, or diarrhea. 02/16/2020 upon evaluation today patient appears to be doing well at this time with regard to his legs. He has a couple weeping areas on his toes but for the most part everything is doing better and does appear to be sealed up on his legs which is excellent news. We can continue with wrapping him at this point as he had every time we discontinue the wraps he just breaks out with new wounds. There is really no point in is going forward with this at this point. 03/08/2020 upon evaluation today patient actually appears to be doing quite well with regard to his lower extremity ulcers. He has just a very superficial and really almost nonexistent blister on the left lower extremity he has in general done very well with the compression wraps. With that being said I do not see any signs of infection at this time which is good news. 03/29/2020 upon evaluation today patient appears to be doing well with regard to his wounds currently except for where he had several new areas that opened up due to some of the wrap slipping and causing him trouble. He states he did not realize they had slipped. Nonetheless he has a 1 area on the right and 3  new areas on the left. Fortunately there is no signs of active infection at this time which is great news. 04/05/2020 upon evaluation today patient actually appears to be doing quite well in general in regard to his legs currently. Fortunately there is no signs of active infection at this time. No fevers, chills, nausea, vomiting, or diarrhea. He tells me next week that he will actually be seen in the lymphedema clinic on Thursday at 10 AM I see him on Wednesday next week. 04/12/2020 upon evaluation today patient appears to be doing very well with regard to his lower extremities bilaterally. In fact he does not appear to have any open wounds at this point which is good news. Fortunately there is no signs of active infection at this time. No fevers, chills, nausea, vomiting, or diarrhea. 04/19/2020 upon evaluation today patient appears to be doing well with regard to his wounds currently on the bilateral lower extremities. There does not appear to be any signs of active infection at this time. Fortunately there is no evidence of systemic infection and overall very pleased at this point. Nonetheless after I held him out last week he literally had blisters the next morning already which swelled up with him being right back here in the clinic. Overall I think that he is just not can be able to be discharged with his legs the  way they are he is much to volume overloaded as far as fluid is concerned and that was discussed with him today of also discussed this but should try the clinic nurse manager as well as Dr. Leanord Hawking. Electronic Signature(s) Signed: 04/19/2020 10:21:43 AM By: Lenda Kelp PA-C Entered By: Lenda Kelp on 04/19/2020 10:21:42 -------------------------------------------------------------------------------- Physical Exam Details Patient Name: Date of Service: [name], Efstathios J. 04/19/2020 8:45 A M Medical Record Number: [id number] Patient Account Number: [id number] Date of Birth/Sex:  Treating RN: 10-May-1951 (69 y.o. [name] Primary Care Provider: Marney Setting, [name] Other Clinician: Referring Provider: Treating Provider/Extender: Lenda Kelp [facility] [name], [name] in Treatment: 221 Constitutional Obese and well-hydrated in no acute distress. Respiratory normal breathing without difficulty. Psychiatric this patient is able to make decisions and demonstrates good insight into disease process. Alert and Oriented x 3. pleasant and cooperative. Notes Upon inspection patient's wound bed actually showed signs of healing and a lot of areas where he had had new blisters pop up on the dorsal surface of the foot he had a lot of dry skin there is actually a blister underneath this I did have to remove that today and he does have a raw area nonetheless I think this will actually be able to better heal in this manner. Electronic Signature(s) Signed: 04/19/2020 10:22:24 AM By: Lenda Kelp PA-C Entered By: Lenda Kelp on 04/19/2020 10:22:24 -------------------------------------------------------------------------------- Physician Orders Details Patient Name: Date of Service: [name], Levis J. 04/19/2020 8:45 A M Medical Record Number: [id number] Patient Account Number: [id number] Date of Birth/Sex: Treating RN: 1951/02/26 (69 y.o. 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 Dressing Change Frequency Do not change entire dressing for one week. - both legs Skin Barriers/Peri-Wound Care Moisturizing lotion - both legs Wound Cleansing May shower with protection. Primary Wound Dressing Wound #172 Right T [place] Calcium Alginate with Silver Wound #173 Left,Medial Lower Leg Calcium Alginate with Silver Wound #174 Left,Dorsal Foot Calcium Alginate with Silver Secondary Dressing Wound #172 Right T [place] Dry Gauze ABD pad - as needed Wound #173 Left,Medial Lower Leg Dry Gauze ABD pad - as needed Wound #174 Left,Dorsal Foot Dry Gauze ABD pad - as needed Edema Control 4 layer compression: Left lower extremity Unna Boot to Right Lower Extremity Avoid standing for long periods of time Elevate legs to the level of the heart or above for 30 minutes daily and/or when sitting, a frequency of: - throughout the day Exercise regularly Electronic Signature(s) Signed: 04/20/2020 1:39:37 PM By: Lenda Kelp PA-C Signed: 04/20/2020 5:22:52 PM By: Zenaida Deed RN, BSN Entered By: Zenaida Deed on 04/19/2020 10:18:29 -------------------------------------------------------------------------------- Problem List Details Patient Name: Date of Service: [facility], Remer J. 04/19/2020 8:45 A M Medical Record Number: [id number] Patient Account Number: [id number] Date of Birth/Sex: Treating RN: [birth year] (69 y.o. [name] Primary Care Provider: Marney Setting, [name] Other  Clinician: Referring Provider: Treating Provider/Extender: Suzy Bouchard, [name] in Treatment: 221 Active Problems ICD-10 Encounter Code Description Active Date MDM Diagnosis 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 skin 06/30/2018 No Yes L97.221 Non-pressure chronic ulcer of left calf limited to  breakdown of skin 09/30/2016 No Yes 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 of 01/22/2016 No Yes bilateral lower extremity I89.0 Lymphedema, not elsewhere classified 01/22/2016 No Yes E11.622 Type 2 diabetes mellitus with other skin ulcer 01/22/2016 No Yes 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, unspecified 01/22/2016 No Yes L03.116 Cellulitis of left lower limb 04/01/2017 No Yes Inactive Problems ICD-10 Code Description Active Date Inactive Date 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 skin 06/30/2017 06/30/2017 L97.521 Non-pressure chronic ulcer of other 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 of skin 04/27/2018 04/27/2018 L03.115 Cellulitis of right lower limb 12/22/2017 12/22/2017 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 06/30/2018 06/30/2018 L97.511 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 of skin 06/30/2018 06/30/2018 Resolved Problems Electronic Signature(s) Signed: 04/19/2020 8:45:49 AM By: Lenda Kelp PA-C Entered By: Lenda Kelp on 04/19/2020 08:45:47 -------------------------------------------------------------------------------- Progress Note Details Patient Name: Date of Service: [name], Mahamadou J. 04/19/2020 8:45 A M Medical Record Number: [id number] Patient Account Number: [id number] Date of Birth/Sex: Treating RN: 07-06-1951 (69 y.o. [name] Primary Care Provider: Marney Setting, [name] Other Clinician: Referring Provider: Treating Provider/Extender: Lenda Kelp [facility] [name], [name] in Treatment: 221 Subjective Chief Complaint Information obtained from Patient patient is here for evaluation venous/lymphedema weeping History of Present Illness (HPI) Referred by PCP for consultation. Patient has long standing history of BLE venous stasis, no prior ulcerations. At beginning of month, developed cellulitis  and weeping. Received IM Rocephin followed by Keflex and resolved. Wears compression stocking, appr 6 months old. Not sure strength. No present drainage. 01/22/16 this is a patient who is a type II diabetic on insulin. 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 He tells me he religiously wears pressure stockings of uncertain strength. He was here with weeping edema about 8 months ago but did not have an open wound. Roughly a month ago he had a reopening on his bilateral legs. He is been using bandages and Neosporin. He does not complain of pain. 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 Last BUN/creatinine I have is from 11/20/15 at 13 and 1.0 respectively 01/29/16; patient arrives today having tolerated the Profore wrap. He brought in his stockings and these are 18 mmHg stockings he bought from [employer]. The compression here is likely inadequate. He does not complain of pain or excessive drainage she has no systemic symptoms. The wound on the right looks improved as does the one on the left although one on the left is more substantial with still tissue at risk below the actual wound area on the bilateral posterior calf 02/05/16; patient arrives with poor edema control. He states that we did put a 4 layer compression on it last week. No weight appear 5 this. 02/12/16; the area on the posterior right Has healed. The left Has a substantial wound that has necrotic surface eschar that requires a debridement with a curette. 02/16/16;the patient called or a Nurse visit secondary to increased swelling. He had been in  earlier in the week with his right leg healed. He was transitioned to is on pressure stocking on the right leg with the only open wound on the left, a substantial area on the left posterior calf. Note he has a history of severe lower extremity edema, he has a  history of chronic atrial fibrillation but not heart failure per my notes but I'll need to research this. He is not complaining of chest pain shortness of breath or orthopnea. The intake nurse noted blisters on the previously closed right leg 02/19/16; this is the patient's regular visit day. I see him on Friday with escalating edema new wounds on the right leg and clear signs of at least right ventricular heart failure. I increased his Lasix to 40 twice a day. He is returning currently in follow-up. States he is noticed a decrease in that the edema 02/26/16 patient's legs have much less edema. There is nothing really open on the right leg. The left leg has improved condition of the large superficial wound on the posterior left leg 03/04/16; edema control is very much better. The patient's right leg wounds have healed. On the left leg he continues to have severe venous inflammation on the posterior aspect of the left leg. There is no tenderness and I don't think any of this is cellulitis. 03/11/16; patient's right leg is married healed and he is in his own stocking. The patient's left leg has deteriorated somewhat. There is a lot of erythema around the wound on the posterior left leg. There is also a significant rim of erythema posteriorly just above where the wrap would've ended there is a new wound in this location and a lot of tenderness. Can't rule out cellulitis in this area. 03/15/16; patient's right leg remains healed and he is in his own stocking. The patient's left leg is much better than last review. His major wound on the posterior aspect of his left Is almost fully epithelialized. He has 3 small injuries from the wraps. Really. Erythema seems a lot better on antibiotics 03/18/16; right leg remains healed and he is in his own stocking. The patient's left leg is much better. The area on the posterior aspect of the left calf is fully epithelialized. His 3 small injuries which were wrap injuries on the  left are improved only one seems still open his erythema has resolved 03/25/16; patient's right leg remains healed and he is in his own stocking. There is no open area today on the left leg posterior leg is completely closed up. His wrap injuries at the superior aspect of his leg are also resolved. He looks as though he has some irritation on the dorsal ankle but this is fully epithelialized without evidence of infection. 03/28/16; we discharged this patient on Monday. Transitioned him into his own stocking. There were problems almost immediately with uncontrolled swelling weeping edema multiple some of which have opened. He does not feel systemically unwell in particular no chest pain no shortness of breath and he does not feel 04/08/16; the edema is under better control with the Profore light wrap but he still has pitting edema. There is one large wound anteriorly 2 on the medial aspect of his left leg and 3 small areas on the superior posterior calf. Drainage is not excessive he is tolerating a Profore light well 04/15/16; put a Profore wrap on him last week. This is controlled is edema however he had a lot of pain on his left anterior foot most of his  wounds are healed 04/22/16 once again the patient has denuded areas on the left anterior foot which he states are because his wrap slips up word. He saw his primary physician today is on Lasix 40 twice a day and states that he his weight is down 20 pounds over the last 3 months. 04/29/16: Much improved. left anterior foot much improved. He is now on Lasix 80 mg per day. Much improved edema control 05/06/16; I was hoping to be able to discharge him today however once again he has blisters at a low level of where the compression was placed last week mostly on his left lateral but also his left medial leg and a small area on the anterior part of the left foot. 05/09/16; apparently the patient went home after his appointment on 7/4 later in the evening developing  pain in his upper medial thigh together with subjective fever and chills although his temperature was not taken. The pain was so intense he felt he would probably have to call 911. However he then remembered that he had leftover doxycycline from a previous round of antibiotics and took these. By the next morning he felt a lot better. He called and spoke to one of our nurses and I approved doxycycline over the phone thinking that this was in relation to the wounds we had previously seen although they were definitely were not. The patient feels a lot better old fever no chills he is still working. Blood sugars are reasonably controlled 05/13/16; patient is back in for review of his cellulitis on his anterior medial upper thigh. He is taking doxycycline this is a lot better. Culture I did of the nodular area on the dorsal aspect of his foot grew MRSA this also looks a lot better. 05/20/16; the patient is cellulitis on the medial upper thigh has resolved. All of his wound areas including the left anterior foot, areas on the medial aspect of the left calf and the lateral aspect of the calf at all resolved. He has a new blister on the left dorsal foot at the level of the fourth toe this was excised. No evidence of infection 05/27/16; patient continues to complain weeping edema. He has new blisterlike wounds on the left anterior lateral and posterior lateral calf at the top of his wrap levels. The area on his left anterior foot appears better. He is not complaining of fever, pain or pruritus in his feet. 05/30/16; the patient's blisters on his left anterior leg posterior calf all look improved. He did not increase the Lasix 100 mg as I suggested because he was going to run out of his 40 mg tablets. He is still having weeping edema of his toes 06/03/16; I renewed his Lasix at 80 mg once a day as he was about to run out when I last saw him. He is on 80 mg of Lasix now. I have asked him to cut down on the excessive  amount of water he was drinking and asked him to drink according to his thirst mechanisms 06/12/2016 -- was seen 2 days ago and was supposed to wear his compression stockings at home but he is developed lymphedema and superficial blisters on the left lower extremity and hence came in for a review 06/24/16; the remaining wound is on his left anterior leg. He still has edema coming from between his toes. There is lymphedema here however his edema is generally better than when I last saw this. He has a history of atrial  fibrillation but does not have a known history of congestive heart failure nevertheless I think he probably has this at least on a diastolic basis. 07/01/16 I reviewed his echocardiogram from January 2017. This was essentially normal. He did not have LVH, EF of 55-60%. His right ventricular function was normal although he did have trivial tricuspid and pulmonic regurgitation. This is not audible on exam however. I increased his Lasix to do massive edema in his legs well above his knees I think in early July. He was also drinking an excessive amount of water at the time. 07/15/16; missed his appointment last week because of the Labor Day holiday on Monday. He could not get another appointment later in the week. Started to feel the wrap digging in superiorly so we remove the top half and the bottom half of his wrap. He has extensive erythema and blistering superiorly in the left leg. Very tender. Very swollen. Edema in his foot with leaking edema fluid. He has not been systemically unwell 07/22/16; the area on the left leg laterally required some debridement. The medial wounds look more stable. His wrap injury wounds appear to have healed. Edema and his foot is better, weeping edema is also better. He tells me he is meeting with the supplier of the external compression pumps at work 08/05/16; the patient was on vacation last week in [facility]. His wrap is been on for an extended period of  time. Also over the weekend he developed an extensive area of tender erythema across his anterior medial thigh. He took to doxycycline yesterday that he had leftover from a previous prescription. The patient complains of weeping edema coming out of his toes 08/08/16; I saw this patient on 10/2. He was tender across his anterior thigh. I put him on doxycycline. He returns today in follow-up. He does not have any open wounds on his lower leg, he still has edema weeping into his toes. 08/12/16; patient was seen back urgently today to follow-up for his extensive left thigh cellulitis/erysipelas. He comes back with a lot less swelling and erythema pain is much better. I believe I gave him Augmentin and Cipro. His wrap was cut down as he stated a roll down his legs. He developed blistering above the level of the wrap that remained. He has 2 open blisters and 1 intact. 08/19/16; patient is been doing his primary doctor who is increased his Lasix from 40-80 once a day or 80 already has less edema. Cellulitis has remained improved in the left thigh. 2 open areas on the posterior left calf 08/26/16; he returns today having new open blisters on the anterior part of his left leg. He has his compression pumps but is not yet been shown how to use some vital representative from the supplier. 09/02/16 patient returns today with no open wounds on the left leg. Some maceration in his plantar toes 09/10/2016 -- Dr. Leanord Hawking had recently discharged him on 09/02/2016 and he has come right back with redness swelling and some open ulcers on his left lower extremity. He says this was caused by trying to apply his compression stockings and he's been unable to use this and has not been able to use his lymphedema pumps. He had some doxycycline leftover and he has started on this a few days ago. 09/16/16; there are no open wounds on his leg on the left and no evidence of cellulitis. He does continue to have probable lymphedema of  his toes, drainage and maceration between his  toes. He does not complain of symptoms here. I am not clear use using his external compression pumps. 09/23/16; I have not seen this patient in 2 weeks. He canceled his appointment 10 days ago as he was going on vacation. He tells me that on Monday he noticed a large area on his posterior left leg which is been draining copiously and is reopened into a large wound. He is been using ABDs and the external part of his juxtalite, according to our nurse this was not on properly. 10/07/16; Still a substantial area on the posterior left leg. Using silver alginate 10/14/16; in general better although there is still open area which looks healthy. Still using silver alginate. He reminds me that this happen before he left for [facility]. T oday while he was showering in the morning. He had been using his juxtalite's 10/21/16; the area on his posterior left leg is fully epithelialized. However he arrives today with a large area of tender erythema in his medial and posterior left thigh just above the knee. I have marked the area. Once again he is reluctant to consider hospitalization. I treated him with oral antibiotics in the past for a similar situation with resolution I think with doxycycline however this area it seems more extensive to me. He is not complaining of fever but does have chills and says states he is thirsty. His blood sugar today was in the 140s at home 10/25/16 the area on his posterior left leg is fully epithelialized although there is still some weeping edema. The large area of tenderness and erythema in his medial and posterior left thigh is a lot less tender although there is still a lot of swelling in this thigh. He states he feels a lot better. He is on doxycycline and Augmentin that I started last week. This will continued until Tuesday, December 26. I have ordered a duplex ultrasound of the left thigh rule out DVT whether there is an abscess  something that would need to be drained I would also like to know. 11/01/16; he still has weeping edema from a not fully epithelialized area on his left posterior calf. Most of the rest of this looks a lot better. He has completed his antibiotics. His thigh is a lot better. Duplex ultrasound did not show a DVT in the thigh 11/08/16; he comes in today with more Denuded surface epithelium from the posterior aspect of his calf. There is no real evidence of cellulitis. The superior aspect of his wrap appears to have put quite an indentation in his leg just below the knee and this may have contributed. He does not complain of pain or fever. We have been using silver alginate as the primary dressing. The area of cellulitis in the right thigh has totally resolved. He has been using his compression stockings once a week 11/15/16; the patient arrives today with more loss of epithelium from the posterior aspect of his left calf. He now has a fairly substantial wound in this area. The reason behind this deterioration isn't exactly clear although his edema is not well controlled. He states he feels he is generally more swollen systemically. He is not complaining of chest pain shortness of breath fever. T me he has an appointment with his primary physician in early February. He is on 80 mg of oral ells Lasix a day. He claims compliance with the external compression pumps. He is not having any pain in his legs similar to what he has with his  recurrent cellulitis 11/22/16; the patient arrives a follow-up of his large area on his left lateral calf. This looks somewhat better today. He came in earlier in the week for a dressing change since I saw him a week ago. He is not complaining of any pain no shortness of breath no chest pain 11/28/16; the patient arrives for follow-up of his large area on the left lateral calf this does not look better. In fact it is larger weeping edema. The surface of the wound does not look too  bad. We have been using silver alginate although I'm not certain that this is a dressing issue. 12/05/16; again the patient follows up for a large wound on the left lateral and left posterior calf this does not look better. There continues to be weeping edema necrotic surface tissue. More worrisome than this once again there is erythema below the wound involving the distal Achilles and heel suggestive of cellulitis. He is on his feet working most of the day of this is not going well. We are changing his dressing twice a week to facilitate the drainage. 12/12/16; not much change in the overall dimensions of the large area on the left posterior calf. This is very inflamed looking. I gave him an. Doxycycline last week does not really seem to have helped. He found the wrap very painful indeed it seems to of dog into his legs superiorly and perhaps around the heel. He came in early today because the drainage had soaked through his dressings. 12/19/16- patient arrives for follow-up evaluation of his left lower extremity ulcers. He states that he is using his lymphedema pumps once daily when there is "no drainage". He admits to not using his lipedema pumps while under current treatment. His blood sugars have been consistently between 150-200. 12/26/16; the patient is not using his compression pumps at home because of the wetness on his feet. I've advised him that I think it's important for him to use this daily. He finds his feet too wet, he can put a plastic bag over his legs while he is in the pumps. Otherwise I think will be in a vicious circle. We are using silver alginate to the major area on his left posterior calf 01/02/17; the patient's posterior left leg has further of all into 3 open wounds. All of them covered with a necrotic surface. He claims to be using his compression pumps once a day. His edema control is marginal. Continue with silver alginate 01/10/17; the patient's left posterior leg actually looks  somewhat better. There is less edema, less erythema. Still has 3 open areas covered with a necrotic surface requiring debridement. He claims to be using his compression pumps once a day his edema control is better 01/17/17; the patient's left posterior calf look better last week when I saw him and his wrap was changed 2 days ago. He has noted increasing pain in the left heel and arrives today with much larger wounds extensive erythema extending down into the entire heel area especially tender medially. He is not systemically unwell CBGs have been controlled no fever. Our intake nurse showed me limegreen drainage on his AVD pads. 01/24/17; his usual this patient responds nicely to antibiotics last week giving him Levaquin for presumed Pseudomonas. The whole entire posterior part of his leg is much better much less inflamed and in the case of his Achilles heel area much less tender. He has also had some epithelialization posteriorly there are still open areas here and still  draining but overall considerably better 01/31/17- He has continue to tolerate the compression wraps. he states that he continues to use the lymphedema pumps daily, and can increase to twice daily on the weekends. He is voicing no complaints or concerns regarding his LLE ulcers 02/07/17-he is here for follow-up evaluation. He states that he noted some erythema to the left medial and anterior thigh, which he states is new as of yesterday. He is concerned about recurrent cellulitis. He states his blood sugars have been slightly elevated, this morning in the 180s 02/14/17; he is here for follow-up evaluation. When he was last here there was erythema superiorly from his posterior wound in his anterior thigh. He was prescribed Levaquin however a culture of the wound surface grew MRSA over the phone I changed him to doxycycline on Monday and things seem to be a lot better. 02/24/17; patient missed his appointment on Friday therefore we changed  his nurse visit into a physician visit today. Still using silver alginate on the large area of the posterior left thigh. He isn't new area on the dorsal left second toe 03/03/17; actually better today although he admits he has not used his external compression pumps in the last 2 days or so because of work responsibilities over the weekend. 03/10/17; continued improvement. External compression pumps once a day almost all of his wounds have closed on the posterior left calf. Better edema control 03/17/17; in general improved. He still has 3 small open areas on the lateral aspect of his left leg however most of the area on the posterior part of his leg is epithelialized. He has better edema control. He has an ABD pad under his stocking on the right anterior lower leg although he did not let us look at that today. 03/24/17; patient arrives back in clinic today with no open areas however there are areas on the posterior left calf and anterior left calf that are less than 100% epithelialized. His edema is well controlled in the left lower leg. There is some pitting edema probably lymphedema in the left upper thigh. He uses compression pumps at home once per day. I tried to get him to do this twice a day although he is very reticent. 04/01/2017 -- for the last 2 days he's had significant redness, tenderness and weeping and came in for an urgent visit today. 04/07/17; patient still has 6 more days of doxycycline. He was seen by Dr. Meyer Russel last Wednesday for cellulitis involving the posterior aspect, lateral aspect of his Involving his heel. For the most part he is better there is less erythema and less weeping. He has been on his feet for 12 hours o2 over the weekend. Using his compression pumps once a day 04/14/17 arrives today with continued improvement. Only one area on the posterior left calf that is not fully epithelialized. He has intense bilateral venous inflammation associated with his chronic venous  insufficiency disease and secondary lymphedema. We have been using silver alginate to the left posterior calf wound In passing he tells [place] today that the right leg but we have not seen in quite some time has an open area on it but he doesn't want [place] to look at this today states he will show this to [place] next week. 04/21/17; there is no open area on his left leg although he still reports some weeping edema. He showed [place] his right leg today which is the first time we've seen this leg in a long time. He has a  large area of open wound on the right leg anteriorly healthy granulation. Quite a bit of swelling in the right leg and some degree of venous inflammation. He told us about the right leg in passing last week but states that deterioration in the right leg really only happened over the weekend 04/28/17; there is no open area on the left leg although there is an irritated part on the posterior which is like a wrap injury. The wound on the right leg which was new from last week at least to [place] is a lot better. 05/05/17; still no open area on the left leg. Patient is using his new compression stocking which seems to be doing a good job of controlling the edema. He states he is using his compression pumps once per day. The right leg still has an open wound although it is better in terms of surface area. Required debridement. A lot of pain in the posterior right Achilles marked tenderness. Usually this type of presentation this patient gives concern for an active cellulitis 05/12/17; patient arrives today with his major wound from last week on the right lateral leg somewhat better. Still requiring debridement. He was using his compression stocking on the left leg however that is reopened with superficial wounds anteriorly he did not have an open wound on this leg previously. He is still using his juxta light's once daily at night. He cannot find the time to do this in the morning as he has to be at work by 7  AM 05/19/17; right lateral leg wound looks improved. No debridement required. The concerning area is on the left posterior leg which appears to almost have a subcutaneous hemorrhagic component to it. We've been using silver alginate to all the wounds 05/26/17; the right lateral leg wound continues to look improved. However the area on the left posterior calf is a tightly adherent surface. [place] using silver alginate. Because of the weeping edema in his legs there is very little good alternatives. 06/02/17; the patient left here last week looking quite good. Major wound on the left posterior calf and a small one on the right lateral calf. Both of these look satisfactory. He tells me that by Wednesday he had noted increased pain in the left leg and drainage. He called on Thursday and Friday to get an appointment here but we were blocked. He did not go to urgent care or his primary physician. He thinks he had a fever on Thursday but did not actually take his temperature. He has not been using his compression pumps on the left leg because of pain. I advised him to go to the emergency room today for IV antibiotics for stents of left leg cellulitis but he has refused I have asked him to take 2 days off work to keep his leg elevated and he has refused this as well. In view of this I'm going to call him and Augmentin and doxycycline. 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 However after a detailed discussion regarding the risk benefits and alternatives he refuses to get admitted to the hospital. With no other recourse I will continue him on oral antibiotics as before and hopefully he'll have his infectious disease consultation this week. 06/16/2017 -- the patient  was  seen today by the nurse practitioner at infectious disease Ms. Dixon. 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 He has also been advise Lotrimin cream locally. she also had wise IV antibiotics for his condition if it flares up 06/23/17; patient arrives today with drainage bilaterally although the remaining wound on the left posterior calf after cleaning up today "highlighter yellow drainage" did not look too bad. Unfortunately he has had breakdown on the right anterior leg [previously this leg had not been open and he is using a black stocking] he went to see infectious disease and is been put on clindamycin 150 mg daily, I did not verify the dose although I'm not familiar with using clindamycin in this dosing range, perhaps for prophylaxisoo 06/27/17; I brought this patient back today to follow-up on the wound deterioration on the right lower leg together with surrounding cellulitis. I started him on doxycycline 4 days ago. This area looks better however he comes in today with intense cellulitis on the medial part of his left thigh. This is not have a wound in this area. Extremely tender. We've been using silver alginate to the wounds on the right lower leg left lower leg with bilateral 4 layer compression he is using his external compression pumps once a day 07/04/17; patient's left medial thigh cellulitis looks better. He has not been using his compression pumps as his insert said it was contraindicated with cellulitis. His right leg continues to make improvements all the wounds are still open. We only have one remaining wound on the left posterior calf. Using silver alginate to all open areas. He is on doxycycline which I started a week ago and should be finishing I gave him Augmentin after Thursday's visit for the severe  cellulitis on the left medial thigh which fortunately looks better 07/14/17; the patient's left medial thigh cellulitis has resolved. The cellulitis in his right lower calf on the right also looks better. All of his wounds are stable to improved we've been using silver alginate he has completed the antibiotics I have given him. He has clindamycin 150 mg once a day prescribed by infectious disease for prophylaxis, I've advised him to start this now. We have been using bilateral Unna boots over silver alginate to the wound areas 07/21/17; the patient is been to see infectious disease who noted his recurrent problems with cellulitis. He was not able to tolerate prophylactic clindamycin therefore he is on amoxicillin 500 twice a day. He also had a second daily dose of Lasix added By Dr. Melford Aase but he is not taking this. Nor is he being completely compliant with his compression pumps a especially not this week. He has 2 remaining wounds one on the right posterior lateral lower leg and one on the left posterior medial lower leg. 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 The patient has Unna boots bilaterally. 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 He has bilateral Unna boots for compression and silver alginate to his wounds. Arrives today with his legs looking as good as I have seen him in quite some time. Not  surprisingly his wounds look better as well with improvement on the right lateral leg venous insufficiency wound and also the left medial leg. He is still using the compression pumps once a day 08/11/17; both legs appear to be doing better wounds on the right lateral and left medial legs look better. Skin on the right leg quite good. He is been using silver alginate as the  primary dressing. I'm going to use Anasept gel calcium alginate and maintain all the secondary dressings 08/18/17; the patient continues to actually do quite well. The area on his right lateral leg is just about closed the left medial also looks better although it is still moist in this area. His edema is well controlled we have been using Anasept gel with calcium alginate and the usual secondary dressings, 4 layer compression and once daily use of his compression pumps "always been able to manage 09/01/17; the patient continues to do reasonably well in spite of his trip to [place]. The area on the right lateral leg is epithelialized. Left is much better but still open. He has more edema and more chronic erythema on the left leg [venous inflammation] 09/08/17; he arrives today with no open wound on the right lateral leg and decently controlled edema. Unfortunately his left leg is not nearly as in his good situation as last week.he apparently had increasing edema starting on Saturday. He edema soaked through into his foot so used a plastic bag to walk around his home. The area on the medial right leg which was his open area is about the same however he has lost surface epithelium on the left lateral which is new and he has significant pain in the Achilles area of the left foot. He is already on amoxicillin chronically for prophylaxis of cellulitis in the left leg 09/15/17; he is completed a week of doxycycline and the cellulitis in the left posterior leg and Achilles area is as usual improved. He still has a lot of edema and fluid soaking through his dressings. There is no open wound on the right leg. He saw infectious disease NP today 09/22/17;As usual 1 we transition him from our compression wraps to his stockings things did not go well. He has several small open areas on the right leg. He states this was caused by the compression wrap on his skin although he did not wear this with the stockings over  them. He has several superficial areas on the left leg medially laterally posteriorly. He does not have any evidence of active cellulitis especially involving the left Achilles [facility] [name] He states he isn't attempting to get an appointment with a heel objects wound center there to change his dressings. I am not completely certain whether this will work 10/06/17; the patient came in on Friday for a nurse visit and the nurse reported that his legs actually look quite good. He arrives in clinic today for his regular follow-up visit. He has a new wound on his left third toe over the PIP probably caused by friction with his footwear. He has small areas on the left leg and a very superficial but epithelialized area on the right anterior lateral lower leg. Other than that his legs look as good as I've seen him in quite some time. We have been using silver alginate Review of systems; no chest pain no shortness of breath other than this a 10 point review of systems negative 10/20/17; seen by Dr.  Britto last week. He had taken some antibiotics [doxycycline] that he had left over. Dr. Meyer Russel thought he had candida infection and declined to give him further antibiotics. He has a small wound remaining on the right lateral leg several areas on the left leg including a larger area on the left posterior several left medial and anterior and a small wound on the left lateral. The area on the left dorsal third toe looks a lot better. ROS; Gen.; no fever, respiratory no cough no sputum Cardiac no chest pain other than this 10 point review of system is negative 10/30/17; patient arrives today having fallen in the bathtub 3 days ago. It took him a while to get up. He has pain and maceration in the wounds on his left leg which have deteriorated. He has not been using his pumps he also has some maceration on the right lateral leg. 11/03/17; patient continues to  have weeping edema especially in the left leg. This saturates his dressings which were just put on on 12/27. As usual the doxycycline seems to take care of the cellulitis on his lower leg. He is not complaining of fever, chills, or other systemic symptoms. He states his leg feels a lot better on the doxycycline I gave him empirically. He also apparently gets injections at his primary doctor's officeo Rocephin for cellulitis prophylaxis. I didn't ask him about his compression pump compliance today I think that's probably marginal. Arrives in the clinic with all of his dressings primary and secondary macerated full of fluid and he has bilateral edema 11/10/17; the patient's right leg looks some better although there is still a cluster of wounds on the right lateral. The left leg is inflamed with almost circumferential skin loss medially to laterally although we are still maintaining anteriorly. He does not have overt cellulitis there is a lot of drainage. He is not using compression pumps. We have been using silver alginate to the wound areas, there are not a lot of options here 11/17/17; the patient's right leg continues to be stable although there is still open wounds, better than last week. The inflammation in the left leg is better. Still loss of surface layer epithelium especially posteriorly. There is no overt cellulitis in the amount of edema and his left leg is really quite good, tells me he is using his compression pumps once a day. 11/24/17; patient's right leg has a small superficial wound laterally this continues to improve. The inflammation in the left leg is still improving however we have continuous surface layer epithelial loss posteriorly. There is no overt cellulitis in the amount of edema in both legs is really quite good. He states he is using his compression pumps on the left leg once a day for 5 out of 7 days 12/01/17; very small superficial areas on the right lateral leg continue to  improve. Edema control in both legs is better today. He has continued loss of surface epithelialization and left posterior calf although I think this is better. We have been using silver alginate with large number of absorptive secondary dressings 4 layer on the left Unna boot on the right at his request. He tells me he is using his compression pumps once a day 12/08/17; he has no open area on the right leg is edema control is good here. ooOn the left leg however he has marked erythema and tenderness breakdown of skin. He has what appears to be a wrap injury just distal to the popliteal fossa. This  is the pattern of his recurrent cellulitis area and he apparently received penicillin at his primary physician's office really worked in my view but usually response to doxycycline given it to him several times in the past 12/15/17; the patient had already deteriorated last Friday when he came in for his nurse check. There was swelling erythema and breakdown in the right leg. He has much worse skin breakdown in the left leg as well multiple open areas medially and posteriorly as well as laterally. He tells me he has been using his compression pumps but tells me he feels that the drainage out of his leg is worse when he uses a compression pumps. T be fair to him he is been saying this o for a while however I don't know that I have really been listening to this. I wonder if the compression pumps are working properly 12/22/17;. Once again he arrives with severe erythema, weeping edema from the left greater than right leg. Noncompliance with compression pumps. New this visit he is complaining of pain on the lateral aspect of the right leg and the medial aspect of his right thigh. He apparently saw his cardiologist Dr. [name] who was ordered an echocardiogram area and I think this is a step in the right direction 12/25/17; started his doxycycline Monday night. There is still intense erythema of the right leg  especially in the anterior thigh although there is less tenderness. The erythema around the wound on the right lateral calf also is less tender. He still complaining of pain in the left heel. His wounds are about the same right lateral left medial left lateral. Superficial but certainly not close to closure. He denies being systemically unwell no fever chills no abdominal pain no diarrhea 12/29/17; back in follow-up of his extensive right calf and right thigh cellulitis. I added amoxicillin to cover possible doxycycline resistant strep. This seems to of done the trick he is in much less pain there is much less erythema and swelling. He has his echocardiogram at 11:00 this morning. X-ray of the left heel was also negative. 01/05/18; the patient arrived with his edema under much better control. Now that he is retired he is able to use his compression pumps daily and sometimes twice a day per the patient. He has a wound on the right leg the lateral wound looks better. Area on the left leg also looks a lot better. He has no evidence of cellulitis in his bilateral thighs I had a quick peak at his echocardiogram. He is in normal ejection fraction and normal left ventricular function. He has moderate pulmonary hypertension moderately reduced right ventricular function. One would have to wonder about chronic sleep apnea although he says he doesn't snore. He'll review the echocardiogram with his cardiologist. 01/12/18; the patient arrives with the edema in both legs under exemplary control. He is using his compression pumps daily and sometimes twice daily. His wound on the right lateral leg is just about closed. He still has some weeping areas on the posterior left calf and lateral left calf although everything is just about closed here as well. I have spoken with Aldean Baker who is the patient's nurse practitioner and infectious disease. She was concerned that the patient had not understood that the  parenteral penicillin injections he was receiving for cellulitis prophylaxis was actually benefiting him. I don't think the patient actually saw that I would tend to agree we were certainly dealing with less infections although he had a serious one  last month. 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 He is healed. He'll be placed in juxtalite compression wraps and increase his lymphedema pumps to twice daily. We will follow up again next week to ensure there are no issues with the new regiment. 01/20/18-he is here for evaluation of bilateral lower extremity weeping edema. Yesterday he was placed in compression wrap to the right lower extremity and compression stocking to left lower shrubbery. He states he uses lymphedema pumps last night and again this morning and noted a blister to the left lower extremity. On exam he was noted to have drainage to the right lower extremity. He will be placed in Unna boots bilaterally and follow-up next week 01/26/18; patient was actually discharged a week ago to his own juxta light stockings only to return the next day with bilateral lower extremity weeping edema.he was placed in bilateral Unna boots. He arrives today with pain in the back of his left leg. There is no open area on the right leg however there is a linear/wrap injury on the left leg and weeping edema on the left leg posteriorly. I spoke with infectious disease about 10 days ago. They were disappointed that the patient elected to discontinue prophylactic intramuscular penicillin shots as they felt it was particularly beneficial in reducing the frequency of his cellulitis. I discussed this with the patient today. He does not share this view. He'll definitely need antibiotics today. Finally he is traveling to [place] and trauma leaving this Saturday and returning a week later and he does not travel with his pumps. He is going by car 01/30/18; patient was seen 4 days ago  and brought back in today for review of cellulitis in the left leg posteriorly. I put him on amoxicillin this really hasn't helped as much as I might like. He is also worried because he is traveling to [facility] trauma by car. Finally we will be rewrapping him. There is no open area on the right leg over his left leg has multiple weeping areas as usual 02/09/18; The same wrap on for 10 days. He did not pick up the last doxycycline I prescribed for him. He apparently took 4 days worth he already had. There is nothing open on his right leg and the edema control is really quite good. He's had damage in the left leg medially and laterally especially probably related to the prolonged use of Unna boots 02/12/18; the patient arrived in clinic today for a nurse visit/wrap change. He complained of a lot of pain in the left posterior calf. He is taking doxycycline that I previously prescribed for him. Unfortunately even though he used his stockings and apparently used to compression pumps twice a day he has weeping edema coming out of the lateral part of his right leg. This is coming from the lower anterior lateral skin area. 02/16/18; the patient has finished his doxycycline and will finish the amoxicillin 2 days. The area of cellulitis in the left calf posteriorly has resolved. He is no longer having any pain. He tells me he is using his compression pumps at least once a day sometimes twice. 02/23/18; the patient finished his doxycycline and Amoxil last week. On Friday he noticed a small erythematous circle about the size of a quarter on the left lower leg just above his ankle. This rapidly expanded and he now has erythema on the lateral and posterior part of the thigh. This is bright red. Also has an area on  the dorsal foot just above his toes and a tender area just below the left popliteal fossa. He came off his prophylactic penicillin injections at his own insistence one or 2 months ago. This is obviously  deteriorated since then 03/02/18; patient is on doxycycline and Amoxil. Culture I did last week of the weeping area on the back of his left calf grew group B strep. I have therefore renewed the amoxicillin 500 3 times a day for a further week. He has not been systemically unwell. Still complaining of an area of discomfort right under his left popliteal fossa. There is no open wound on the right leg. He tells me that he is using his pumps twice a day on most days 03/09/18; patient arrives in clinic today completing his amoxicillin today. The cellulitis on his left leg is better. Furthermore he tells me that he had intramuscular penicillin shots that his primary care office today. However he also states that the wrap on his right leg fell down shortly after leaving clinic last week. He developed a large blister that was present when he came in for a nurse visit later in the week and then he developed intense discomfort around this area.He tells me he is using his compression pumps 03/16/18; the patient has completed his doxycycline. The infectious part of this/cellulitis in the left heel area left popliteal area is a lot better. He has 2 open areas on the right calf. Still areas on the left calf but this is a lot better as well. 03/24/18; the patient arrives complaining of pain in the left popliteal area again. He thinks some of this is wrap injury. He has no open area on the right leg and really no open area on the left calf either except for the popliteal area. He claims to be compliant with the compression pumps 03/31/18; I gave him doxycycline last week because of cellulitis in the left popliteal area. This is a lot better although the surface epithelium is denuded off and response to this. He arrives today with uncontrolled edema in the right calf area as well as a fingernail injury in the right lateral calf. There is only a few open areas on the left 04/06/18; I gave him amoxicillin doxycycline over the  last 2 weeks that the amoxicillin should be completing currently. He is not complaining of any pain or systemic symptoms. The only open areas see has is on the right lateral lower leg paradoxically I cannot see anything on the left lower leg. He tells me he is using his compression pumps twice a day on most days. Silver alginate to the wounds that are open under 4 layer compression 04/13/18; he completed antibiotics and has no new complaints. Using his compression pumps. Silver alginate that anything that's opened 04/20/18; he is using his compression pumps religiously. Silver alginate 4 layer compression anything that's opened. He comes in today with no open wounds on the left leg but 3 on the right including a new one posteriorly. He has 2 on the right lateral and one on the right posterior. He likes Unna boots on the right leg for reasons that aren't really clear we had the usual 4 layer compression on the left. It may be necessary to move to the 4 layer compression on the right however for now I left them in the Unna boots 04/27/18; he is using his compression pumps at least once a day. He has still the wounds on the right lateral calf. The area  right posteriorly has closed. He does not have an open wound on the left under 4 layer compression however on the dorsal left foot just proximal to the toes and the left third toe 2 small open areas were identified 05/11/18; he has not uses compression pumps. The areas on the right lateral calf have coalesced into one large wound necrotic surface. On the left side he has one small wound anteriorly however the edema is now weeping out of a large part of his left leg. He says he wasn't using his pumps because of the weeping fluid. I explained to him that this is the time he needs to pump more 05/18/18; patient states he is using his compression pumps twice a day. The area on the right lateral large wound albeit superficial. On the left side he has innumerable  number of small new wounds on the left calf particularly laterally but several anteriorly and medially. All these appear to have healthy granulated base these look like the remnants of blisters however they occurred under compression. The patient arrives in clinic today with his legs somewhat better. There is certainly less edema, less multiple open areas on the left calf and the right anterior leg looks somewhat better as well superficial and a little smaller. However he relates pain and erythema over the last 3-4 days in the thigh and I looked at this today. He has not been systemically unwell no fever no chills no change in blood sugar values 05/25/18; comes in today in a better state. The severe cellulitis on his left leg seems better with the Keflex. Not as tender. He has not been systemically unwell ooHard to find an open wound on the left lower leg using his compression pumps twice a day ooThe confluent wounds on his right lateral calf somewhat better looking. These will ultimately need debridement I didn't do this today. 06/01/18; the severe cellulitis on the left anterior thigh has resolved and he is completed his Keflex. ooThere is no open wound on the left leg however there is a superficial excoriation at the base of the third toe dorsally. Skin on the bottom of his left foot is macerated looking. ooThe left the wounds on the lateral right leg actually looks some better although he did require debridement of the top half of this wound area with an open curet 06/09/18 on evaluation today patient appears to be doing poorly in regard to his right lower extremity in particular this appears to likely be infected he has very thick purulent discharge along with a bright green tent to the discharge. This makes me concerned about the possibility of pseudomonas. He's also having increased discomfort at this point on evaluation. Fortunately there does not appear to be any evidence of infection  spreading to the other location at this time. 06/16/18 on evaluation today patient appears to actually be doing fairly well. His ulcer has actually diminished in size quite significantly at this point which is good news. Nonetheless he still does have some evidence of infection he did see infectious disease this morning before coming here for his appointment. I did review the results of their evaluation and their note today. They did actually have him discontinue the Cipro and initiate treatment with linezolid at this time. He is doing this for the next seven days and they recommended a follow-up in four months with them. He is the keep a log of the need for intermittent antibiotic therapy between now and when he falls back up  with infectious disease. This will help them gaze what exactly they need to do to try and help them out. 06/23/18; the patient arrives today with no open wounds on the left leg and left third toe healed. He is been using his compression pumps twice a day. On the right lateral leg he still has a sizable wound but this is a lot better than last time I saw this. 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 Has been using silver alginate under 4 layer compression 06/30/18; the only open wound he has is on the right lateral leg and this looks healthy. No debridement is required. We have been using silver alginate. He does not have an open wound on the left leg. There is apparently some drainage from the dorsal proximal third toe on the left although I see no open wound here. 07/03/18 on evaluation today patient was actually here just for a nurse visit rapid change. However when he was here on Wednesday for his rat change due to having been healed on the left and then developing blisters we initiated the wrap again knowing that he would be back today for [place] to reevaluate and see were at.  Unfortunately he has developed some cellulitis into the proximal portion of his right lower extremity even into the region of his thigh. 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 He was placed on one as what at that point. Nonetheless he is done with that and has been tolerating it well otherwise. Doxycycline which in the past really did not seem to be effective for him. Nonetheless I think the best option may be for [place] to definitely reinitiate the antibiotics for a longer period of time. 07/07/18; since I last saw this patient a week ago he has had a difficult time. At that point he did not have an open wound on his left leg. We transitioned him into juxta light stockings. He was apparently in the clinic the next day with blisters on the left lateral and left medial lower calf. He also had weeping edema fluid. He was put back into a compression wrap. He was also in the clinic on Friday with intense erythema in his right thigh. Per the patient he was started on Bactrim however that didn't work at all in terms of relieving his pain and swelling. He has taken 3 doxycycline that he had left over from last time and that seems to of helped. He has blistering on the right thigh as well. 07/14/18; the erythema on his right thigh has gotten better with doxycycline that he is finishing. 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 Presumably this cellulitis in the thigh was not related to that although I think this is a bit concerning going forward. He still has an area on the right lateral calf the blister on the right medial calf just below the knee that was discussed above. On the left 2 small open areas left medial and left lateral. Edema control is adequate. He is using his compression pumps twice a day 07/20/18; continued improvement in the condition of both legs especially the edema in his  bilateral thighs. He tells me he is been losing weight through a combination of diet and exercise. He is using his  compression pumps twice a day. So overall she made to the remaining wounds 07/27/2018; continued improvement in condition of both legs. His edema is well controlled. The area on the right lateral leg is just about closed he had one blisters show up on the medial left upper calf. We have him in 4 layer compression. He is going on a 10-day trip to [place], [place]. He will be driving. He wants to wear Unna boots because of the lessening amount of constriction. He will not use compression pumps while he is away 08/05/18 on evaluation today patient actually appears to be doing decently well all things considered in regard to his bilateral lower extremities. The worst ulcer is actually only posterior aspect of his left lower extremity with a four layer compression wrap cut into his leg a couple weeks back. He did have a trip and actually had [employer] for the trip that he is worn since he was last here. Nonetheless he feels like the [employer] actually do better for him his swelling is up a little bit but he also with his trip was not taking his Lasix on a regular set schedule like he was supposed to be. He states that obviously the reason being that he cannot drive and keep going without having to urinate too frequently which makes it difficult. He did not have his pumps with him while he was away either which I think also maybe playing a role here too. 08/13/2018; the patient only has a small open wound on the right lateral calf which is a big improvement in the last month or 2. He also has the area posteriorly just below the posterior fossa on the left which I think was a wrap injury from several weeks ago. He has no current evidence of cellulitis. He tells me he is back into his compression pumps twice a day. He also tells me that while he was at the laundromat  somebody stole a section of his extremitease stockings 08/20/2018; back in the clinic with a much improved state. He only has small areas on the right lateral mid calf which is just about healed. This was is more substantial area for quite a prolonged period of time. He has a small open area on the left anterior tibia. The area on the posterior calf just below the popliteal fossa is closed today. He is using his compression pumps twice a day 08/28/2018; patient has no open wound on the right leg. He has a smattering of open areas on the calf with some weeping lymphedema. More problematically than that it looks as though his wraps of slipped down in his usual he has very angry upper area of edema just below the right medial knee and on the right lateral calf. He has no open area on his feet. The patient is traveling to [facility] next week. I will send him in an antibiotic. We will continue to wrap the right leg. We ordered extremitease stockings for him last week and I plan to transition the right leg to a stocking when he gets home which will be in 10 days time. As usual he is very reluctant to take his pumps with him when he travels 09/07/2018; patient returns from [facility] He shows me a picture of his left leg in the mid part of his trip last week with intense fire engine erythema. The picture look bad enough I would have considered sending him to the  hospital. Instead he went to the wound care center in [facility]. They did not prescribe him antibiotics but he did take some doxycycline he had leftover from a previous visit. I had given him trimethoprim sulfamethoxazole before he left this did not work according to the patient. This is resulted in some improvement fortunately. He comes back with a large wound on the left posterior calf. Smaller area on the left anterior tibia. Denuded blisters on the dorsal left foot over his toes. Does not have much in the way of wounds on the  right leg although he does have a very tender area on the right posterior area just below the popliteal fossa also suggestive of infection. He promises me he is back on his pumps twice a day 09/15/2018; the intense cellulitis in his left lower calf is a lot better. The wound area on the posterior left calf is also so better. However he has reasonably extensive wounds on the dorsal aspect of his second and third toes and the proximal foot just at the base of the toes. There is nothing open on the right leg 09/22/2018; the patient has excellent edema control in his legs bilaterally. He is using his external compression pumps twice a day. He has no open area on the right leg and only the areas in the left foot dorsally second and third toe area on the left side. He does not have any signs of active cellulitis. 10/06/2018; the patient has good edema control bilaterally. He has no open wound on the right leg. There is a blister in the posterior aspect of his left calf that we had to deal with today. He is using his compression pumps twice a day. There is no signs of active cellulitis. We have been using silver alginate to the wound areas. He still has vulnerable areas on the base of his left first second toes dorsally He has a his extremities stockings and we are going to transition him today into the stocking on the right leg. He is cautioned that he will need to continue to use the compression pumps twice a day. If he notices uncontrolled edema in the right leg he may need to go to 3 times a day. 10/13/2018; the patient came in for a nurse check on Friday he has a large flaccid blister on the right medial calf just below the knee. We unroofed this. He has this and a new area underneath the posterior mid calf which was undoubtedly a blister as well. He also has several small areas on the right which is the area we put his extremities stocking on. 10/19/2018; the patient went to see infectious disease this  morning I am not sure if that was a routine follow-up in any case the doxycycline I had given him was discontinued and started on linezolid. He has not started this. It is easy to look at his left calf and the inflammation and think this is cellulitis however he is very tender in the tissue just below the popliteal fossa and I have no doubt that there is infection going on here. He states the problem he is having is that with the compression pumps the edema goes down and then starts walking the wrap falls down. We will see if we can adhere this. He has 1 or 2 minuscule open areas on the right still areas that are weeping on the posterior left calf, the base of his left second and third toes 10/26/18; back today  in clinic with quite of skin breakdown in his left anterior leg. This may have been infection the area below the popliteal fossa seems a lot better however tremendous epithelial loss on the left anterior mid tibia area over quite inexpensive tissue. He has 2 blisters on the right side but no other open wound here. 10/29/2018; came in urgently to see [place] today and we worked him in for review. He states that the 4 layer compression on the right leg caused pain he had to cut it down to roughly his mid calf this caused swelling above the wrap and he has blisters and skin breakdown today. As a result of the pain he has not been using his pumps. Both legs are a lot more edematous and there is a lot of weeping fluid. 11/02/18; arrives in clinic with continued difficulties in the right leg> left. Leg is swollen and painful. multiple skin blisters and new open areas especially laterally. He has not been using his pumps on the right leg. 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 He is not systemically unwell. 11/09/2018; the patient claims he is being compliant with his pumps. He is finished the doxycycline I gave him last week. Culture I did of the wound on the  right lateral leg showed a few very resistant methicillin staph aureus. This was resistant to doxycycline. Nevertheless he states the pain in the leg is a lot better which makes me wonder if the cultured organism was not really what was causing the problem nevertheless this is a very dangerous organism to be culturing out of any wound. His right leg is still a lot larger than the left. He is using an Radio broadcast assistant on this area, he blames a 4-layer compression for causing the original skin breakdown which I doubt is true however I cannot talk him out of it. We have been using silver alginate to all of these areas which were initially blisters 11/16/2018; patient is being compliant with his external compression pumps at twice a day. Miraculously he arrives in clinic today with absolutely no open wounds. He has better edema control on the left where he has been using 4 layer compression versus wound of wounds on the right and I pointed this out to him. There is no inflammation in the skin in his lower legs which is also somewhat unusual for him. There is no open wounds on the dorsal left foot. He has extremitease stockings at home and I have asked him to bring these in next week. 11/25/18 patient's lower extremity on examination today on the left appears for the most part to be wound free. He does have an open wound on the lateral aspect of the right lower extremity but this is minimal compared to what I've seen in past. He does request that we go ahead and wrap the left leg as well even though there's nothing open just so hopefully it will not reopen in short order. 1/28; patient has superficial open wounds on the right lateral calf left anterior calf and left posterior calf. His edema control is adequate. 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 We have been using silver alginate as the primary dressing. He claims  compliance with his compression pumps 2/4; patient has superficial open wounds on numerous areas of his left calf and again one on the left dorsal foot. The areas  on the right lateral calf have healed. The cellulitis that I gave him doxycycline for last week is also resolved this was mostly on the left anterior calf just below the tibial tuberosity. His edema looks fairly well-controlled. He tells me he went to see his primary doctor today and had blood work ordered 2/11; once again he has several open areas on the left calf left tibial area. Most of these are small and appear to have healthy granulation. He does not have anything open on the right. The edema and control in his thighs is pretty good which is usually a good indication he has been using his pumps as requested. 2/18; he continues to have several small areas on the left calf and left tibial area. Most of these are small healthy granulation. We put him in his stocking on the right leg last week and he arrives with a superficial open area over the right upper tibia and a fairly large area on the right lateral tibia in similar condition. His edema control actually does not look too bad, he claims to be using his compression pumps twice a day 2/25. Continued small areas on the left calf and left tibial area. New areas especially on the right are identified just below the tibial tuberosity and on the right upper tibia itself. There are also areas of weeping edema fluid even without an obvious wound. He does not have a considerable degree of lymphedema but clearly there is more edema here than his skin can handle. He states he is using the pumps twice a day. We have an Unna boot on the right and 4 layer compression on the left. 3/3; he continues to have an area on the right lateral calf and right posterior calf just below the popliteal fossa. There is a fair amount of tenderness around the wound on the popliteal fossa but I did not see any  evidence of cellulitis, could just be that the wrap came down and rubbed in this area. ooHe does not have an open area on the left leg however there is an area on the left dorsal foot at the base of the third toe ooWe have been using silver alginate to all wound areas 3/10; he did not have an open area on his left leg last time he was here a week ago. T oday he arrives with a horizontal wound just below the tibial tuberosity and an area on the left lateral calf. He has intense erythema and tenderness in this area. The area is on the right lateral calf and right posterior calf better than last week. We have been using silver alginate as usual 3/18 - Patient returns with 3 small open areas on left calf, and 1 small open area on right calf, the skin looks ok with no significant erythema, he continues the UNA boot on right and 4 layer compression on left. The right lateral calf wound is closed , the right posterior is small area. we will continue silver alginate to the areas. 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 He is been tolerating the dressing changes without complication. Fortunately she has made excellent improvement in regard to the overall status of his wounds. Unfortunately every time we cease wrapping him he ends up reopening in causing more significant issues at that point.  Again I'm unsure of the best direction to take although I think the lymphedema clinic may be appropriate for him. 02/03/19 on evaluation today patient appears to be doing well in regard to the wounds that we saw him for last week unfortunately he has a new area on the proximal portion of his right medial/posterior lower extremity where the wrap somewhat slowed down and caused swelling and a blister to rub and  open. Unfortunately this is the only opening that he has on either leg at this point. 02/17/19 on evaluation today patient's bilateral lower extremities appear to be doing well. He still completely healed in regard to the left lower extremity. In regard to the right lower extremity the area where the wrap and slid down and caused the blister still seems to be slightly open although this is dramatically better than during the last evaluation two weeks ago. I'm very pleased with the way this stands overall. 03/03/19 on evaluation today patient appears to be doing well in regard to his right lower extremity in general although he did have a new blister open this does not appear to be showing any evidence of active infection at this time. Fortunately there's No fevers, chills, nausea, or vomiting noted at this time. Overall I feel like he is making good progress it does feel like that the right leg will we perform the [employer] seems to do with a bit better than three layer wrap on the left which slid down on him. We may switch to doing bilateral in the book wraps. 5/4; I have not seen Mr. Robinette in quite some time. According to our case manager he did not have an open wound on his left leg last week. He had 1 remaining wound on the right posterior medial calf. He arrives today with multiple openings on the left leg probably were blisters and/or wrap injuries from Unna boots. I do not think the Unna boot's will provide adequate compression on the left. I am also not clear about the frequency he is using the compression pumps. 03/17/19 on evaluation today patient appears to be doing excellent in regard to his lower extremities compared to last week's evaluation apparently. He had gotten significantly worse last week which is unfortunate. The [employer] wrap on the left did not seem to do very well for him at all and in fact it didn't control his swelling significantly enough he had an additional outbreak.  Subsequently we go back to the four layer compression wrap on the left. This is good news. At least in that he is doing better and the wound seem to be killing him. He still has not heard anything from the lymphedema clinic. 03/24/19 on evaluation today patient actually appears to be doing much better in regard to his bilateral lower Trinity as compared to last week when I saw him. Fortunately there's no signs of active infection at this time. He has been tolerating the dressing changes without complication. Overall I'm extremely pleased with the progress and appearance in general. 04/07/19 on evaluation today patient appears to be doing well in regard to his bilateral lower extremities. His swelling is significantly down from where it was previous. With that being said he does have a couple blisters still open at this point but fortunately nothing that seems to be too severe and again the majority of the larger openings has healed at this time. 04/14/19 on evaluation today patient actually appears to be doing quite well in regard to  his bilateral lower extremities in fact I'm not even sure there's anything significantly open at this time at any site. Nonetheless he did have some trouble with these wraps where they are somewhat irritating him secondary to the fact that he has noted that the graph wasn't too close down to the end of this foot in a little bit short as well up to his knee. Otherwise things seem to be doing quite well. 04/21/19 upon evaluation today patient's wound bed actually showed evidence of being completely healed in regard to both lower extremities which is excellent news. There does not appear to be any signs of active infection which is also good news. I'm very pleased in this regard. No fevers, chills, nausea, or vomiting noted at this time. 04/28/19 on evaluation today patient appears to be doing a little bit worse in regard to both lower extremities on the left mainly due to the  fact that when he went infection disease the wrap was not wrapped quite high enough he developed a blister above this. On the right he is a small open area of nothing too significant but again this is continuing to give him some trouble he has been were in the Velcro compression that he has at home. 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 He's been tolerating the dressing changes without complication. Fortunately there's no signs of active infection at this time. No fevers, chills, nausea, or vomiting noted at this time. We have been trying to get an appointment with her lymphedema clinic in [facility] but unfortunately nobody can get them on phone with not been able to even fax information over the patient likewise is not been able to get in touch with them. Overall I'm not sure exactly what's going on here with to reach out again today. 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 Still having a lot of drainage unfortunately. He tells me especially in the left but even on the right. There's no signs of active infection which is good news we've been using so ratcheted up to this point. 05/19/19 on evaluation today patient actually appears to be doing quite well with regard to his left lower extremity which is great news. Fortunately in regard to the right lower extremity has an issues with his wrap and he subsequently did remove this from what I'm understanding. Nonetheless long story short is what he had rewrapped once he removed it subsequently had maggots underneath this wrap whenever he came in for evaluation today. With that being said they were obviously completely cleaned away by the nursing staff. The visit today which is excellent news. However he does appear to potentially have some infection around the right ankle region where  the maggots were located as well. He will likely require anabiotic therapy today. 05/26/19 on evaluation today patient actually appears to be doing much better in regard to his bilateral lower extremities. I feel like the infection is under much better control. With that being said there were maggots noted when the wrap was removed yet again today. Again this could have potentially been left over from previous although at this time there does not appear to be any signs of significant drainage there was obviously on the wrap some drainage as well this contracted gnats or otherwise. Either way I do not see anything that appears to be doing  worse in my pinion and in fact I think his drainage has slowed down quite significantly likely mainly due to the fact to his infection being under better control. 06/02/2019 on evaluation today patient actually appears to be doing well with regard to his bilateral lower extremities there is no signs of active infection at this time which is great news. With that being said he does have several open areas more so on the right than the left but nonetheless these are all significantly better than previously noted. 06/09/2019 on evaluation today patient actually appears to be doing well. His wrap stayed up and he did not cause any problems he had more drainage on the right compared to the left but overall I do not see any major issues at this time which is great news. 06/16/2019 on evaluation today patient appears to be doing excellent with regard to his lower extremities the only area that is open is a new blister that can have opened as of today on the medial ankle on the left. Other than this he really seems to be doing great I see no major issues at this point. 06/23/2019 on evaluation today patient appears to be doing quite well with regard to his bilateral lower extremities. In fact he actually appears to be almost completely healed there is a small area of weeping noted  of the right lower extremity just above the ankle. Nonetheless fortunately there is no signs of active infection at this time which is good news. No fevers, chills, nausea, vomiting, or diarrhea. 8/24; the patient arrived for a nurse visit today but complained of very significant pain in the left leg and therefore I was asked to look at this. Noted that he did not have an open area on the left leg last week nevertheless this was wrapped. The patient states that he is not been able to put his compression pumps on the left leg because of the discomfort. He has not been systemically unwell 06/30/2019 on evaluation today patient unfortunately despite being excellent last week is doing much worse with regard to his left lower extremity today. In fact he had to come in for a nurse on Monday where his left leg had to be rewrapped due to excessive weeping Dr. Leanord Hawking placed him on doxycycline at that point. Fortunately there is no signs of active infection Systemically at this time which is good news. 07/07/2019 in regard to the patient's wounds today he actually seems to be doing well with his right lower extremity there really is nothing open or draining at this point this is great news. Unfortunately the left lower extremity is given him additional trouble at this time. There does not appear to be any signs of active infection nonetheless he does have a lot of edema and swelling noted at this point as well as blistering all of which has led to a much more poor appearing leg at this time compared to where it was 2 weeks ago when it was almost completely healed. Obviously this is a little discouraging for the patient. He is try to contact the lymphedema clinic in [place] he has not been able to get through to them. 07/14/2019 on evaluation today patient actually appears to be doing slightly better with regard to his left lower extremity ulcers. Overall I do feel like at least at the top of the wrap that we  have been placing this area has healed quite nicely and looks much better. The remainder of the leg  is showing signs of improvement. Unfortunately in the thigh area he still has an open region on the left and again on the right he has been utilizing just a Band-Aid on an area that also opened on the thigh. Again this is an area that were not able to wrap although we did do an Ace wrap to provide some compression that something that obviously is a little less effective than the compression wraps we have been using on the lower portion of the leg. He does have an appointment with the lymphedema clinic in [facility] on Friday. 07/21/2019 on evaluation today patient appears to be doing better with regard to his lower extremity ulcers. He has been tolerating the dressing changes without complication. Fortunately there is no signs of active infection at this time. No fevers, chills, nausea, vomiting, or diarrhea. I did receive the paperwork from the physical therapist at the lymphedema clinic in [place]. Subsequently I signed off on that this morning and sent that back to him for further progression with the treatment plan. 07/28/2019 on evaluation today patient appears to be doing very well with regard to his right lower extremity where I do not see any open wounds at this point. Fortunately he is feeling great as far as that is concerned as well. In regard to the left lower extremity he has been having issues with still several areas of weeping and edema although the upper leg is doing better his lower leg still I think is going require the compression wrap at this time. No fevers, chills, nausea, vomiting, or diarrhea. 08/04/2019 on evaluation today patient unfortunately is having new wounds on the right lower extremity. Again we have been using Unna boot wrap on that side. We switched him to using his juxta lite wrap at home. With that being said he tells me he has been using it although his legs  extremely swollen and to be honest really does not appear that he has been. I cannot know that for sure however. Nonetheless he has multiple new wounds on the right lower extremity at this time. Obviously we will have to see about getting this rewrapped for him today. 08/11/2019 on evaluation today patient appears to be doing fairly well with regard to his wounds. He has been tolerating the dressing changes including the compression wraps without complication. He still has a lot of edema in his upper thigh regions bilaterally he is supposed to be seeing the lymphedema clinic on the 15th of this month once his wraps arrive for the upper part of his legs. 08/18/2019 on evaluation today patient appears to be doing well with regard to his bilateral lower extremities at this point. He has been tolerating the dressing changes without complication. Fortunately there is no signs of active infection which is also good news. He does have a couple weeping areas on the first and second toe of the right foot he also has just a small area on the left foot upper leg and a small area on the left lower leg but overall he is doing quite well in my opinion. He is supposed to be getting his wraps shortly in fact tomorrow and then subsequently is seeing the lymphedema clinic next Wednesday on the 21st. Of note he is also leaving on the 25th to go on vacation for a week to the beach. For that reason and since there is some uncertainty about what there can be doing at lymphedema clinic next Wednesday I am get a make an  appointment for next Friday here for [place] to see what we need to do for him prior to him leaving for vacation. 10/23; patient arrives in considerable pain predominantly in the upper posterior calf just distal to the popliteal fossa also in the wound anteriorly above the major wound. This is probably cellulitis and he has had this recurrently in the past. He has no open wound on the right side and he has had an  Radio broadcast assistant in that area. Finally I note that he has an area on the left posterior calf which by enlarge is mostly epithelialized. This protrudes beyond the borders of the surrounding skin in the setting of dry scaly skin and lymphedema. The patient is leaving for [facility] on Sunday. Per his longstanding pattern, he will not take his compression pumps with him predominantly out of fear that they will be stolen. He therefore asked that we put a Unna boot back on the right leg. He will also contact the wound care center in [facility] to see if they can change his dressing in the mid week. 11/3; patient returned from his vacation to [facility]. He was seen on 1 occasion at their wound care center. They did a 2 layer compression system as they did not have our 4-layer wrap. I am not completely certain what they put on the wounds. They did not change the Unna boot on the right. The patient is also seeing a lymphedema specialist physical therapist in [place]. It appears that he has some compression sleeve for his thighs which indeed look quite a bit better than I am used to seeing. He pumps over these with his external compression pumps. 11/10; the patient has a new wound on the right medial thigh otherwise there is no open areas on the right. He has an area on the left leg posteriorly anteriorly and medially and an area over the left second toe. We have been using silver alginate. He thinks the injury on his thigh is secondary to friction from the compression sleeve he has. 11/17; the patient has a new wound on the right medial thigh last week. He thinks this is because he did not have a underlying stocking for his thigh juxta lite apparatus. He now has this. The area is fairly large and somewhat angry but I do not think he has underlying cellulitis. ooHe has a intact blister on the right anterior tibial area. ooSmall wound on the right great toe dorsally ooSmall area on the medial left  calf. 11/30; the patient does not have any open areas on his right leg and we did not take his juxta lite stocking off. However he states that on Friday his compression wrap fell down lodging around his upper mid calf area. As usual this creates a lot of problems for him. He called urgently today to be seen for a nurse visit however the nurse visit turned into a provider visit because of extreme erythema and pain in the left anterior tibia extending laterally and posteriorly. The area that is problematic is extensive 10/06/2019 upon evaluation today patient actually appears to be doing poorly in regard to his left lower extremity. He Dr. Leanord Hawking did place him on doxycycline this past Monday apparently due to the fact that he was doing much worse in regard to this left leg. Fortunately the doxycycline does seem to be helping. Unfortunately we are still having a very difficult time getting his edema under any type of control in order to anticipate  discharge at some point. The only way were really able to control his lymphedema really is with compression wraps and that has only even seemingly temporary. He has been seeing a lymphedema clinic they are trying to help in this regard but still this has been somewhat frustrating in general for the patient. 10/13/19 on evaluation today patient appears to be doing excellent with regard to his right lower extremity as far as the wounds are concerned. His swelling is still quite extensive unfortunately. He is still having a lot of drainage from the thigh areas bilaterally which is unfortunate. He's been going to lymphedema clinic but again he still really does not have this edema under control as far as his lower extremities are concern. With regard to his left lower extremity this seems to be improving and I do believe the doxycycline has been of benefit for him. He is about to complete the doxycycline. 10/20/2019 on evaluation today patient appears to be doing  poorly in regard to his bilateral lower extremities. More in the right thigh he has a lot of irritation at this site unfortunately. In regard to the left lower extremity the wrap was not quite as high it appears and does seem to have caused him some trouble as well. Fortunately there is no evidence of systemic infection though he does have some blue-green drainage which has me concerned for the possibility of Pseudomonas. He tells me he is previously taking Cipro without complications and he really does not care for Levaquin however due to some of the side effects he has. He is not allergic to any medications specifically antibiotics that were aware of. 10/27/2019 on evaluation today patient actually does appear to be for the most part doing better when compared to last week's evaluation. With that being said he still has multiple open wounds over the bilateral lower extremities. He actually forgot to start taking the Cipro and states that he still has the whole bottle. He does have several new blisters on left lower extremity today I think I would recommend he go ahead and take the Cipro based on what I am seeing at this point. 12/30-Patient comes at 1 week visit, 4 layer compression wraps on the left and Unna boot on the right, primary dressing Xtrasorb and silver alginate. Patient is taking his Cipro and has a few more days left probably 5-6, and the legs are doing better. He states he is using his compressions devices which I believe he has 11/10/2019 on evaluation today patient actually appears to be much better than last time I saw him 2 weeks ago. His wounds are significantly improved and overall I am very pleased in this regard. Fortunately there is no signs of active infection at this time. He is just a couple of days away from completing Cipro. Overall his edema is much better he has been using his lymphedema pumps which I think is also helping at this point. 11/17/2019 on evaluation today  patient appears to be doing excellent in regard to his wounds in general. His legs are swollen but not nearly as much as they have been in the past. Fortunately he is tolerating the compression wraps without complication. No fevers, chills, nausea, vomiting, or diarrhea. He does have some erythema however in the distal portion of his right lower extremity specifically around the forefoot and toes there is a little bit of warmth here as well. 11/24/2019 on evaluation today patient appears to be doing well with regard to his right  lower extremity I really do not see any open wounds at this point. His left lower extremity does have several open areas and his right medial thigh also is open. Other than this however overall the patient seems to be making good progress and I am very pleased at this point. 12/01/2019 on evaluation today patient appears to be doing poorly at this point in regard to his left lower extremity has several new blisters despite the fact that we have him in compression wraps. In fact he had a 4-layer compression wrap, his upper thigh wrapped from lymphedema clinic, and a juxta light over top of the 4 layer compression wrap the lymphedema clinic applied and despite all this he still develop blisters underneath. Obviously this does have me concerned about the fact that unfortunately despite what we are doing to try to get wounds healed he continues to have new areas arise I do not think he is ever good to be at the point where he can realistically just use wraps at home to keep things under control. Typically when we heal him it takes about 1-2 days before he is back in the clinic with severe breakdown and blistering of his lower extremities bilaterally. This is happened numerous times in the past. Unfortunately I think that we may need some help as far as overall fluid overload to kind of limit what we are seeing and get things under better control. 12/08/2019 on evaluation today patient  presents for follow-up concerning his ongoing bilateral lower extremity edema. Unfortunately he is still having quite a bit of swelling the compression wraps are controlling this to some degree but he did see Dr. [name] his cardiologist I do have that available for review today as far as the appointment was concerned that was on 12/06/2019. Obviously that she has been 2 days ago. The patient states that he is only been taking the Lasix 80 mg 1 time a day he had told me previously he was taking this twice a day. Nonetheless Dr. [name] recommended this be up to 80 mg 2 times a day for the patient as he did appear to be fluid overloaded. With that being said the patient states he did this yesterday and he was unable to go anywhere or do anything due to the fact that he was constantly having to urinate. Nonetheless I think that this is still good to be something that is important for him as far as trying to get his edema under control at all things that he is going to be able to just expect his wounds to get under control and things to be better without going through at least a period of time where he is trying to stabilize his fluid management in general and I think increasing the Lasix is likely the first step here. It was also mentioned the possibility that the patient may require metolazone. With that being said he wanted to have the patient take Lasix twice a day first and then reevaluating 2 months to see where things stand. 12/15/2019 upon evaluation today patient appears to be doing regard to his legs although his toes are showing some signs of weeping especially on the left at this point to some degree on the right. There does not appear to be any signs of active infection and overall I do feel like the compression wraps are doing well for him but he has not been able to take the Lasix at home and the increased dose that Dr. [name]  recommended. He tells me that just not go to be feasible for him.  Nonetheless I think in this case he should probably send a message to Dr. [name] in order to discuss options from the standpoint of possible admission to get the fluid off or otherwise going forward. 12/22/2019 upon evaluation today patient appears to be doing fairly well with regard to his lower extremities at this point. In fact he would be doing excellent if it was not for the fact that his right anterior thigh apparently had an allergic reaction to adhesive tape that he used. The wound itself that we have been monitoring actually appears to be healed. There is a lot of irritation at this point. 12/29/2019 upon evaluation today patient appears to be doing well in regard to his lower extremities. His left medial thigh is open and somewhat draining today but this is the only region that is open the right has done much better with the treatment utilizing the steroid cream that I prescribed for him last week. Overall I am pleased in that regard. Fortunately there is no signs of active infection at this time. No fevers, chills, nausea, vomiting, or diarrhea. 01/05/2020 upon evaluation today patient appears to be doing more poorly in regard to his right lower extremity at this point upon evaluation today. Unfortunately he continues to have issues in this regard and I think the biggest issue is controlling his edema. This obviously is not very well controlled at this point is been recommended that he use the Lasix twice a day but he has not been able to do that. Unfortunately I think this is leading to an issue where honestly he is not really able to effectively control his edema and therefore the wounds really are not doing significantly better. I do not think that he is going to be able to keep things under good control unless he is able to control his edema much better. I discussed this again in great detail with him today. 01/12/2020 good news is patient actually appears to be doing quite well today at this  point. He does have an appointment with lymphedema clinic tomorrow. His legs appear healed and the toe on the left is almost completely healed. In general I am very pleased with how things stand at this point. 01/19/2020 upon evaluation today patient appears to actually be doing well in regard to his lower extremities there is nothing open at this point. Fortunately he has done extremely well more recently. Has been seeing lymphedema clinic as well. With that being said he has Velcro wraps for his lower legs as well as his upper legs. The only wound really is on his toe which is the right great toe and this is barely anything even there. With all that being said I think it is good to be appropriate today to go ahead and switch him over to the Velcro compression wraps. 01/26/2020 upon evaluation today patient appears to be doing worse with regard to his lower extremities after last week switch him to Velcro compression wraps. Unfortunately he lasted less than 24 hours he did not have the sock portion of his Velcro wrap on the left leg and subsequently developed a blister underneath the Velcro portion. Obviously this is not good and not what we were looking for at this point. He states the lymphedema clinic did tell him to wear the wrap for 23 hours and take him off for 1 I am okay with that plan but again right  now we got a get things back under control again he may have some cellulitis noted as well. 02/02/2020 upon evaluation today patient unfortunately appears to have several areas of blistering on his bilateral lower extremities today mainly on the feet. His legs do seem to be doing somewhat better which is good news. Fortunately there is no evidence of active infection at this time. No fevers, chills, nausea, vomiting, or diarrhea. 02/16/2020 upon evaluation today patient appears to be doing well at this time with regard to his legs. He has a couple weeping areas on his toes but for the most part  everything is doing better and does appear to be sealed up on his legs which is excellent news. We can continue with wrapping him at this point as he had every time we discontinue the wraps he just breaks out with new wounds. There is really no point in is going forward with this at this point. 03/08/2020 upon evaluation today patient actually appears to be doing quite well with regard to his lower extremity ulcers. He has just a very superficial and really almost nonexistent blister on the left lower extremity he has in general done very well with the compression wraps. With that being said I do not see any signs of infection at this time which is good news. 03/29/2020 upon evaluation today patient appears to be doing well with regard to his wounds currently except for where he had several new areas that opened up due to some of the wrap slipping and causing him trouble. He states he did not realize they had slipped. Nonetheless he has a 1 area on the right and 3 new areas on the left. Fortunately there is no signs of active infection at this time which is great news. 04/05/2020 upon evaluation today patient actually appears to be doing quite well in general in regard to his legs currently. Fortunately there is no signs of active infection at this time. No fevers, chills, nausea, vomiting, or diarrhea. He tells me next week that he will actually be seen in the lymphedema clinic on Thursday at 10 AM I see him on Wednesday next week. 04/12/2020 upon evaluation today patient appears to be doing very well with regard to his lower extremities bilaterally. In fact he does not appear to have any open wounds at this point which is good news. Fortunately there is no signs of active infection at this time. No fevers, chills, nausea, vomiting, or diarrhea. 04/19/2020 upon evaluation today patient appears to be doing well with regard to his wounds currently on the bilateral lower extremities. There does not appear to be  any signs of active infection at this time. Fortunately there is no evidence of systemic infection and overall very pleased at this point. Nonetheless after I held him out last week he literally had blisters the next morning already which swelled up with him being right back here in the clinic. Overall I think that he is just not can be able to be discharged with his legs the way they are he is much to volume overloaded as far as fluid is concerned and that was discussed with him today of also discussed this but should try the clinic nurse manager as well as Dr. Leanord Hawking. Objective Constitutional Obese and well-hydrated in no acute distress. Vitals Time Taken: 9:23 AM, Height: 70 in, Weight: 380.2 lbs, BMI: 54.5, Temperature: 97.6 F, Pulse: 64 bpm, Respiratory Rate: 18 breaths/min, Blood Pressure: 123/61 mmHg. Respiratory normal breathing without difficulty.  Psychiatric this patient is able to make decisions and demonstrates good insight into disease process. Alert and Oriented x 3. pleasant and cooperative. General Notes: Upon inspection patient's wound bed actually showed signs of healing and a lot of areas where he had had new blisters pop up on the dorsal surface of the foot he had a lot of dry skin there is actually a blister underneath this I did have to remove that today and he does have a raw area nonetheless I think this will actually be able to better heal in this manner. Integumentary (Hair, Skin) Wound #172 status is Open. Original cause of wound was Blister. The wound is located on the Right T Great. The wound measures 0.5cm length x 0.5cm oe width x 0.1cm depth; 0.196cm^2 area and 0.02cm^3 volume. There is no tunneling or undermining noted. There is a small amount of serosanguineous drainage noted. There is large (67-100%) red granulation within the wound bed. There is no necrotic tissue within the wound bed. Wound #173 status is Open. Original cause of wound was Blister. The wound  is located on the Left,Medial Lower Leg. The wound measures 1cm length x 0.3cm width x 0.1cm depth; 0.236cm^2 area and 0.024cm^3 volume. There is Fat Layer (Subcutaneous Tissue) Exposed exposed. There is no tunneling or undermining noted. There is a small amount of serosanguineous drainage noted. There is large (67-100%) red granulation within the wound bed. There is no necrotic tissue within the wound bed. Wound #174 status is Open. Original cause of wound was Blister. The wound is located on the Left,Dorsal Foot. The wound measures 1.8cm length x 4.5cm width x 0.1cm depth; 6.362cm^2 area and 0.636cm^3 volume. There is Fat Layer (Subcutaneous Tissue) Exposed exposed. There is no tunneling or undermining noted. There is a medium amount of serous drainage noted. The wound margin is flat and intact. There is large (67-100%) red granulation within the wound bed. There is no necrotic tissue within the wound bed. 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 There was a Selective/Open Wound Skin/Epidermis Debridement with a total area of 8.1 sq cm performed by Lenda Kelp, PA. With the following instrument(s): Curette to remove Non-Viable tissue/material. Material removed includes Skin: Epidermis. No specimens were taken. A time out was conducted at 10:10, prior to the start of the procedure. There was no bleeding. The procedure was tolerated well with a pain level of 0 throughout and a pain  level of 0 following the procedure. Post Debridement Measurements: 1.8cm length x 4.5cm width x 0.1cm depth; 0.636cm^3 volume. Character of Wound/Ulcer Post Debridement is improved. Post procedure Diagnosis Wound #174: Same as Pre-Procedure Wound #173 Pre-procedure diagnosis of Wound #173 is a Diabetic Wound/Ulcer of the Lower Extremity located on the Left,Medial Lower Leg . There was a Four Layer Compression Therapy Procedure by Yevonne Pax, RN. Post procedure Diagnosis Wound #173: Same as Pre-Procedure There was a Radio broadcast assistant Compression Therapy Procedure by Yevonne Pax, RN. Post procedure Diagnosis Wound #: Same as Pre-Procedure Plan Follow-up Appointments: Return Appointment in 1 week. Dressing Change Frequency: Do not change  entire dressing for one week. - both legs Skin Barriers/Peri-Wound Care: Moisturizing lotion - both legs Wound Cleansing: May shower with protection. Primary Wound Dressing: Wound #172 Right T Great: oe Calcium Alginate with Silver Wound #173 Left,Medial Lower Leg: Calcium Alginate with Silver Wound #174 Left,Dorsal Foot: Calcium Alginate with Silver Secondary Dressing: Wound #172 Right T Great: oe Dry Gauze ABD pad - as needed Wound #173 Left,Medial Lower Leg: Dry Gauze ABD pad - as needed Wound #174 Left,Dorsal Foot: Dry Gauze ABD pad - as needed Edema Control: 4 layer compression: Left lower extremity Unna Boot to Right Lower Extremity Avoid standing for long periods of time Elevate legs to the level of the heart or above for 30 minutes daily and/or when sitting, a frequency of: - throughout the day Exercise regularly 1. I would recommend currently that we go ahead and continue with the wound care measures as before and again this is gone including Unna boot wrap to the right lower extremity a 4-layer compression wrap the left lower extremity and we will use alginate on any open areas that are draining. 2. Again I discussed with the patient  that were just in a continue to wrap him and again we are not expecting discharge as each time he does heal he then subsequently will end up with blisters within 12 hours of discharge. Again this really does not reassure me for ever being able to completely discharge him from the clinic at least not why he is so volume overloaded. We will see patient back for reevaluation in 1 week here in the clinic. If anything worsens or changes patient will contact our office for additional recommendations. Electronic Signature(s) Signed: 04/20/2020 4:27:30 PM By: Lenda Kelp PA-C Previous Signature: 04/19/2020 10:23:08 AM Version By: Lenda Kelp PA-C Entered By: Lenda Kelp on 04/20/2020 16:27:30 -------------------------------------------------------------------------------- SuperBill Details Patient Name: Date of Service: [name], [place] J. 04/19/2020 Medical Record Number: [id number] Patient Account Number: [id number] Date of Birth/Sex: Treating RN: [birth year] (69 y.o. [name] Primary Care Provider: Marney Setting, [name] Other Clinician: Referring Provider: Treating Provider/Extender: Suzy Bouchard, [name] in Treatment: 221 Diagnosis Coding ICD-10 Codes Code Description [phone] Non-pressure chronic ulcer of right calf limited to breakdown of skin L97.221 Non-pressure chronic ulcer of left calf limited to breakdown of skin 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 of bilateral lower extremity I89.0 Lymphedema, not elsewhere classified E11.622 Type 2 diabetes mellitus with other skin ulcer 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, unspecified L03.116 Cellulitis of left lower limb Facility Procedures CPT4 Code: [id number] Description: 97597 - DEBRIDE WOUND 1ST 20 SQ CM OR < ICD-10 Diagnosis Description E11.622 Type 2 diabetes mellitus with other skin ulcer Modifier: Quantity: 1 CPT4 Code: [id number] Description:  (Facility Use Only) [id number] - APPLY UNNA BOOT RT Modifier: 59 Quantity: 1 Physician Procedures : CPT4 Code Description Modifier [id number] - WC PHYS DEBR WO ANESTH 20 SQ CM ICD-10 Diagnosis Description E11.622 Type 2 diabetes mellitus with other skin ulcer Quantity: 1 Electronic Signature(s) Signed: 04/19/2020 10:24:47 AM By: Lenda Kelp PA-C Entered By: Lenda Kelp on 04/19/2020 10:24:40
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
## 2020-04-24 ENCOUNTER — Encounter (HOSPITAL_BASED_OUTPATIENT_CLINIC_OR_DEPARTMENT_OTHER): Payer: Medicare Other | Admitting: Internal Medicine

## 2020-04-24 ENCOUNTER — Other Ambulatory Visit (HOSPITAL_COMMUNITY)
Admission: RE | Admit: 2020-04-24 | Discharge: 2020-04-24 | Disposition: A | Discharge status: Home / Self Care | Payer: Medicare Other | Source: Other Acute Inpatient Hospital | Attending: Internal Medicine | Admitting: Internal Medicine

## 2020-04-24 DIAGNOSIS — L97221 Non-pressure chronic ulcer of left calf limited to breakdown of skin: Secondary | ICD-10-CM | POA: Diagnosis not present

## 2020-04-24 DIAGNOSIS — E1151 Type 2 diabetes mellitus with diabetic peripheral angiopathy without gangrene: Secondary | ICD-10-CM | POA: Diagnosis not present

## 2020-04-24 DIAGNOSIS — E11621 Type 2 diabetes mellitus with foot ulcer: Secondary | ICD-10-CM | POA: Diagnosis not present

## 2020-04-24 DIAGNOSIS — E11622 Type 2 diabetes mellitus with other skin ulcer: Secondary | ICD-10-CM | POA: Diagnosis not present

## 2020-04-24 DIAGNOSIS — I890 Lymphedema, not elsewhere classified: Secondary | ICD-10-CM | POA: Diagnosis not present

## 2020-04-24 DIAGNOSIS — E1121 Type 2 diabetes mellitus with diabetic nephropathy: Secondary | ICD-10-CM | POA: Diagnosis not present

## 2020-04-24 DIAGNOSIS — E1140 Type 2 diabetes mellitus with diabetic neuropathy, unspecified: Secondary | ICD-10-CM | POA: Diagnosis not present

## 2020-04-24 DIAGNOSIS — L03116 Cellulitis of left lower limb: Secondary | ICD-10-CM | POA: Diagnosis not present

## 2020-04-24 DIAGNOSIS — Z794 Long term (current) use of insulin: Secondary | ICD-10-CM | POA: Diagnosis not present

## 2020-04-24 DIAGNOSIS — I87333 Chronic venous hypertension (idiopathic) with ulcer and inflammation of bilateral lower extremity: Secondary | ICD-10-CM | POA: Diagnosis not present

## 2020-04-24 DIAGNOSIS — L97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 right calf limited to breakdown of skin: Secondary | ICD-10-CM | POA: Diagnosis not present

## 2020-04-24 DIAGNOSIS — Z79899 Other long term (current) drug therapy: Secondary | ICD-10-CM | POA: Diagnosis not present

## 2020-04-24 NOTE — Progress Notes (Signed)
[name], [name] ([id number]) Visit Report for 04/24/2020 SuperBill Details Patient Name: Date of Service: [name], [name] 04/24/2020 Medical Record Number: [id number] Patient Account Number: [id number] Date of Birth/Sex: Treating RN: [birth year] (69 y.o. 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 of bilateral lower extremity I89.0 Lymphedema, not elsewhere classified E11.622 Type 2 diabetes mellitus with other skin ulcer 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, unspecified L03.116 Cellulitis of left lower limb Facility Procedures CPT4 Code Description Modifier Quantity [id number] (Facility Use Only) [phone] - APPLY MULTLAY COMPRS LWR LT LEG 1 Electronic Signature(s) Signed: 04/24/2020 6:03:41 PM By: Baltazar Najjar MD Signed: 04/24/2020 6:25:00 PM By: Shawn Stall Entered By: Shawn Stall on 04/24/2020 14:49:41
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
## 2020-04-26 ENCOUNTER — Encounter (HOSPITAL_BASED_OUTPATIENT_CLINIC_OR_DEPARTMENT_OTHER): Payer: Medicare Other | Admitting: Physician Assistant

## 2020-04-26 DIAGNOSIS — I890 Lymphedema, not elsewhere classified: Secondary | ICD-10-CM | POA: Diagnosis not present

## 2020-04-26 DIAGNOSIS — I87333 Chronic venous hypertension (idiopathic) with ulcer and inflammation of bilateral lower extremity: Secondary | ICD-10-CM | POA: Diagnosis not present

## 2020-04-26 DIAGNOSIS — L97522 Non-pressure chronic ulcer of other part of left foot with fat layer exposed: Secondary | ICD-10-CM | POA: Diagnosis not present

## 2020-04-26 DIAGNOSIS — Z794 Long term (current) use of insulin: Secondary | ICD-10-CM | POA: Diagnosis not present

## 2020-04-26 DIAGNOSIS — E1140 Type 2 diabetes mellitus with diabetic neuropathy, unspecified: Secondary | ICD-10-CM | POA: Diagnosis not present

## 2020-04-26 DIAGNOSIS — E1151 Type 2 diabetes mellitus with diabetic peripheral angiopathy without gangrene: Secondary | ICD-10-CM | POA: Diagnosis not present

## 2020-04-26 DIAGNOSIS — Z79899 Other long term (current) drug therapy: Secondary | ICD-10-CM | POA: Diagnosis not present

## 2020-04-26 DIAGNOSIS — L97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 right calf limited to breakdown of skin: Secondary | ICD-10-CM | POA: Diagnosis not present

## 2020-04-26 DIAGNOSIS — L97221 Non-pressure chronic ulcer of left calf limited to breakdown of skin: Secondary | ICD-10-CM | POA: Diagnosis not present

## 2020-04-26 DIAGNOSIS — E1121 Type 2 diabetes mellitus with diabetic nephropathy: Secondary | ICD-10-CM | POA: Diagnosis not present

## 2020-04-26 DIAGNOSIS — L03116 Cellulitis of left lower limb: Secondary | ICD-10-CM | POA: Diagnosis not present

## 2020-04-26 DIAGNOSIS — E11622 Type 2 diabetes mellitus with other skin ulcer: Secondary | ICD-10-CM | POA: Diagnosis not present

## 2020-04-26 NOTE — Progress Notes (Addendum)
[name], [name] ([id number]) Visit Report for 04/26/2020 Chief Complaint Document Details Patient Name: Date of Service: [name] 04/26/2020 10:30 A M Medical Record Number: [id number] Patient Account Number: [id number] Date of Birth/Sex: Treating RN: 20-Dec-1950 (69 y.o. [name] Primary Care Provider: Marney Setting, [name] Other Clinician: Referring Provider: Treating Provider/Extender: Lenda Kelp [facility] [name], [name] in Treatment: 222 Information Obtained from: Patient Chief Complaint patient is here for evaluation venous/lymphedema weeping Electronic Signature(s) Signed: 04/26/2020 10:52:37 AM By: Lenda Kelp PA-C Entered By: Lenda Kelp on 04/26/2020 10:52:37 -------------------------------------------------------------------------------- HPI Details Patient Name: Date of Service: Kevin [name], [name] 04/26/2020 10:30 A M Medical Record Number: [id number] Patient Account Number: [id number] Date of Birth/Sex: Treating RN: [birth year] (69 y.o. [name] Primary Care Provider: Marney Setting, [name] Other Clinician: Referring Provider: Treating Provider/Extender: Lenda Kelp [facility] [name], [name] in Treatment: 222 History of Present Illness HPI Description: Referred by PCP for consultation. Patient has long standing history of BLE venous stasis, no prior ulcerations. At beginning of month, developed cellulitis and weeping. Received IM Rocephin followed by Keflex and resolved. Wears compression stocking, appr 6 months old. Not sure strength. No present drainage. 01/22/16 this is a patient who is a type II diabetic on insulin. 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 He tells me he religiously wears pressure stockings of uncertain strength. He was here with weeping edema about 8 months ago but did not have an open wound. Roughly a month ago he had a reopening on his bilateral legs. He is been using bandages and Neosporin. He  does not complain of pain. 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 Last BUN/creatinine I have is from 11/20/15 at 13 and 1.0 respectively 01/29/16; patient arrives today having tolerated the Profore wrap. He brought in his stockings and these are 18 mmHg stockings he bought from [place]. The compression here is likely inadequate. He does not complain of pain or excessive drainage she has no systemic symptoms. The wound on the right looks improved as does the one on the left although one on the left is more substantial with still tissue at risk below the actual wound area on the bilateral posterior calf 02/05/16; patient arrives with poor edema control. He states that we did put a 4 layer compression on it last week. No weight appear 5 this. 02/12/16; the area on the posterior right Has healed. The left Has a substantial wound that has necrotic surface eschar that requires a debridement with a curette. 02/16/16;the patient called or a Nurse visit secondary to increased swelling. He had been in earlier in the week with his right leg healed. He was transitioned to is on pressure stocking on the right leg with the only open wound on the left, a substantial area on the left posterior calf. Note he has a history of severe lower extremity edema, he has a history of chronic atrial fibrillation but not heart failure per my notes but I'll need to research this. He is not complaining of chest pain shortness of breath or orthopnea. The intake nurse noted blisters on the previously closed right leg 02/19/16; this is the patient's regular visit day. I see him on Friday with escalating edema new wounds on the right leg and clear signs of at least right ventricular heart failure. I increased his Lasix to  40 twice a day. He is returning currently in follow-up. States he is noticed a decrease in that the  edema 02/26/16 patient's legs have much less edema. There is nothing really open on the right leg. The left leg has improved condition of the large superficial wound on the posterior left leg 03/04/16; edema control is very much better. The patient's right leg wounds have healed. On the left leg he continues to have severe venous inflammation on the posterior aspect of the left leg. There is no tenderness and I don't think any of this is cellulitis. 03/11/16; patient's right leg is married healed and he is in his own stocking. The patient's left leg has deteriorated somewhat. There is a lot of erythema around the wound on the posterior left leg. There is also a significant rim of erythema posteriorly just above where the wrap would've ended there is a new wound in this location and a lot of tenderness. Can't rule out cellulitis in this area. 03/15/16; patient's right leg remains healed and he is in his own stocking. The patient's left leg is much better than last review. His major wound on the posterior aspect of his left Is almost fully epithelialized. He has 3 small injuries from the wraps. Really. Erythema seems a lot better on antibiotics 03/18/16; right leg remains healed and he is in his own stocking. The patient's left leg is much better. The area on the posterior aspect of the left calf is fully epithelialized. His 3 small injuries which were wrap injuries on the left are improved only one seems still open his erythema has resolved 03/25/16; patient's right leg remains healed and he is in his own stocking. There is no open area today on the left leg posterior leg is completely closed up. His wrap injuries at the superior aspect of his leg are also resolved. He looks as though he has some irritation on the dorsal ankle but this is fully epithelialized without evidence of infection. 03/28/16; we discharged this patient on Monday. Transitioned him into his own stocking. There were problems almost  immediately with uncontrolled swelling weeping edema multiple some of which have opened. He does not feel systemically unwell in particular no chest pain no shortness of breath and he does not feel 04/08/16; the edema is under better control with the Profore light wrap but he still has pitting edema. There is one large wound anteriorly 2 on the medial aspect of his left leg and 3 small areas on the superior posterior calf. Drainage is not excessive he is tolerating a Profore light well 04/15/16; put a Profore wrap on him last week. This is controlled is edema however he had a lot of pain on his left anterior foot most of his wounds are healed 04/22/16 once again the patient has denuded areas on the left anterior foot which he states are because his wrap slips up word. He saw his primary physician today is on Lasix 40 twice a day and states that he his weight is down 20 pounds over the last 3 months. 04/29/16: Much improved. left anterior foot much improved. He is now on Lasix 80 mg per day. Much improved edema control 05/06/16; I was hoping to be able to discharge him today however once again he has blisters at a low level of where the compression was placed last week mostly on his left lateral but also his left medial leg and a small area on the anterior part of the left foot. 05/09/16;  apparently the patient went home after his appointment on 7/4 later in the evening developing pain in his upper medial thigh together with subjective fever and chills although his temperature was not taken. The pain was so intense he felt he would probably have to call 911. However he then remembered that he had leftover doxycycline from a previous round of antibiotics and took these. By the next morning he felt a lot better. He called and spoke to one of our nurses and I approved doxycycline over the phone thinking that this was in relation to the wounds we had previously seen although they were definitely were not. The  patient feels a lot better old fever no chills he is still working. Blood sugars are reasonably controlled 05/13/16; patient is back in for review of his cellulitis on his anterior medial upper thigh. He is taking doxycycline this is a lot better. Culture I did of the nodular area on the dorsal aspect of his foot grew MRSA this also looks a lot better. 05/20/16; the patient is cellulitis on the medial upper thigh has resolved. All of his wound areas including the left anterior foot, areas on the medial aspect of the left calf and the lateral aspect of the calf at all resolved. He has a new blister on the left dorsal foot at the level of the fourth toe this was excised. No evidence of infection 05/27/16; patient continues to complain weeping edema. He has new blisterlike wounds on the left anterior lateral and posterior lateral calf at the top of his wrap levels. The area on his left anterior foot appears better. He is not complaining of fever, pain or pruritus in his feet. 05/30/16; the patient's blisters on his left anterior leg posterior calf all look improved. He did not increase the Lasix 100 mg as I suggested because he was going to run out of his 40 mg tablets. He is still having weeping edema of his toes 06/03/16; I renewed his Lasix at 80 mg once a day as he was about to run out when I last saw him. He is on 80 mg of Lasix now. I have asked him to cut down on the excessive amount of water he was drinking and asked him to drink according to his thirst mechanisms 06/12/2016 -- was seen 2 days ago and was supposed to wear his compression stockings at home but he is developed lymphedema and superficial blisters on the left lower extremity and hence came in for a review 06/24/16; the remaining wound is on his left anterior leg. He still has edema coming from between his toes. There is lymphedema here however his edema is generally better than when I last saw this. He has a history of atrial fibrillation  but does not have a known history of congestive heart failure nevertheless I think he probably has this at least on a diastolic basis. 07/01/16 I reviewed his echocardiogram from January 2017. This was essentially normal. He did not have LVH, EF of 55-60%. His right ventricular function was normal although he did have trivial tricuspid and pulmonic regurgitation. This is not audible on exam however. I increased his Lasix to do massive edema in his legs well above his knees I think in early July. He was also drinking an excessive amount of water at the time. 07/15/16; missed his appointment last week because of the Labor Day holiday on Monday. He could not get another appointment later in the week. Started to feel the wrap  digging in superiorly so we remove the top half and the bottom half of his wrap. He has extensive erythema and blistering superiorly in the left leg. Very tender. Very swollen. Edema in his foot with leaking edema fluid. He has not been systemically unwell 07/22/16; the area on the left leg laterally required some debridement. The medial wounds look more stable. His wrap injury wounds appear to have healed. Edema and his foot is better, weeping edema is also better. He tells me he is meeting with the supplier of the external compression pumps at work 08/05/16; the patient was on vacation last week in [facility]. His wrap is been on for an extended period of time. Also over the weekend he developed an extensive area of tender erythema across his anterior medial thigh. He took to doxycycline yesterday that he had leftover from a previous prescription. The patient complains of weeping edema coming out of his toes 08/08/16; I saw this patient on 10/2. He was tender across his anterior thigh. I put him on doxycycline. He returns today in follow-up. He does not have any open wounds on his lower leg, he still has edema weeping into his toes. 08/12/16; patient was seen back urgently today to  follow-up for his extensive left thigh cellulitis/erysipelas. He comes back with a lot less swelling and erythema pain is much better. I believe I gave him Augmentin and Cipro. His wrap was cut down as he stated a roll down his legs. He developed blistering above the level of the wrap that remained. He has 2 open blisters and 1 intact. 08/19/16; patient is been doing his primary doctor who is increased his Lasix from 40-80 once a day or 80 already has less edema. Cellulitis has remained improved in the left thigh. 2 open areas on the posterior left calf 08/26/16; he returns today having new open blisters on the anterior part of his left leg. He has his compression pumps but is not yet been shown how to use some vital representative from the supplier. 09/02/16 patient returns today with no open wounds on the left leg. Some maceration in his plantar toes 09/10/2016 -- Dr. Leanord Hawking had recently discharged him on 09/02/2016 and he has come right back with redness swelling and some open ulcers on his left lower extremity. He says this was caused by trying to apply his compression stockings and he's been unable to use this and has not been able to use his lymphedema pumps. He had some doxycycline leftover and he has started on this a few days ago. 09/16/16; there are no open wounds on his leg on the left and no evidence of cellulitis. 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 He does not complain of symptoms here. I am not clear use using his external compression pumps. 09/23/16; I have not seen this patient in 2 weeks. He canceled his appointment 10 days ago as he was going on vacation. He tells me that on Monday he noticed a large area on his posterior left leg which is been draining copiously and is reopened into a large wound. He is been using ABDs and the external part of his juxtalite, according to our nurse this was not on properly. 10/07/16;  Still a substantial area on the posterior left leg. Using silver alginate 10/14/16; in general better although there is still open area which looks healthy. Still using silver alginate. He reminds me that this happen before he  left for [facility]. T oday while he was showering in the morning. He had been using his juxtalite's 10/21/16; the area on his posterior left leg is fully epithelialized. However he arrives today with a large area of tender erythema in his medial and posterior left thigh just above the knee. I have marked the area. Once again he is reluctant to consider hospitalization. I treated him with oral antibiotics in the past for a similar situation with resolution I think with doxycycline however this area it seems more extensive to me. He is not complaining of fever but does have chills and says states he is thirsty. His blood sugar today was in the 140s at home 10/25/16 the area on his posterior left leg is fully epithelialized although there is still some weeping edema. The large area of tenderness and erythema in his medial and posterior left thigh is a lot less tender although there is still a lot of swelling in this thigh. He states he feels a lot better. He is on doxycycline and Augmentin that I started last week. This will continued until Tuesday, December 26. I have ordered a duplex ultrasound of the left thigh rule out DVT whether there is an abscess something that would need to be drained I would also like to know. 11/01/16; he still has weeping edema from a not fully epithelialized area on his left posterior calf. Most of the rest of this looks a lot better. He has completed his antibiotics. His thigh is a lot better. Duplex ultrasound did not show a DVT in the thigh 11/08/16; he comes in today with more Denuded surface epithelium from the posterior aspect of his calf. There is no real evidence of cellulitis. The superior aspect of his wrap appears to have put quite an  indentation in his leg just below the knee and this may have contributed. He does not complain of pain or fever. We have been using silver alginate as the primary dressing. The area of cellulitis in the right thigh has totally resolved. He has been using his compression stockings once a week 11/15/16; the patient arrives today with more loss of epithelium from the posterior aspect of his left calf. He now has a fairly substantial wound in this area. The reason behind this deterioration isn't exactly clear although his edema is not well controlled. He states he feels he is generally more swollen systemically. He is not complaining of chest pain shortness of breath fever. T me he has an appointment with his primary physician in early February. He is on 80 mg of oral ells Lasix a day. He claims compliance with the external compression pumps. He is not having any pain in his legs similar to what he has with his recurrent cellulitis 11/22/16; the patient arrives a follow-up of his large area on his left lateral calf. This looks somewhat better today. He came in earlier in the week for a dressing change since I saw him a week ago. He is not complaining of any pain no shortness of breath no chest pain 11/28/16; the patient arrives for follow-up of his large area on the left lateral calf this does not look better. In fact it is larger weeping edema. The surface of the wound does not look too bad. We have been using silver alginate although I'm not certain that this is a dressing issue. 12/05/16; again the patient follows up for a large wound on the left lateral and left posterior calf this does not  look better. There continues to be weeping edema necrotic surface tissue. More worrisome than this once again there is erythema below the wound involving the distal Achilles and heel suggestive of cellulitis. He is on his feet working most of the day of this is not going well. We are changing his dressing twice a week to  facilitate the drainage. 12/12/16; not much change in the overall dimensions of the large area on the left posterior calf. This is very inflamed looking. I gave him an. Doxycycline last week does not really seem to have helped. He found the wrap very painful indeed it seems to of dog into his legs superiorly and perhaps around the heel. He came in early today because the drainage had soaked through his dressings. 12/19/16- patient arrives for follow-up evaluation of his left lower extremity ulcers. He states that he is using his lymphedema pumps once daily when there is "no drainage". He admits to not using his lipedema pumps while under current treatment. His blood sugars have been consistently between 150-200. 12/26/16; the patient is not using his compression pumps at home because of the wetness on his feet. I've advised him that I think it's important for him to use this daily. He finds his feet too wet, he can put a plastic bag over his legs while he is in the pumps. Otherwise I think will be in a vicious circle. We are using silver alginate to the major area on his left posterior calf 01/02/17; the patient's posterior left leg has further of all into 3 open wounds. All of them covered with a necrotic surface. He claims to be using his compression pumps once a day. His edema control is marginal. Continue with silver alginate 01/10/17; the patient's left posterior leg actually looks somewhat better. There is less edema, less erythema. Still has 3 open areas covered with a necrotic surface requiring debridement. He claims to be using his compression pumps once a day his edema control is better 01/17/17; the patient's left posterior calf look better last week when I saw him and his wrap was changed 2 days ago. He has noted increasing pain in the left heel and arrives today with much larger wounds extensive erythema extending down into the entire heel area especially tender medially. He is not  systemically unwell CBGs have been controlled no fever. Our intake nurse showed me limegreen drainage on his AVD pads. 01/24/17; his usual this patient responds nicely to antibiotics last week giving him Levaquin for presumed Pseudomonas. The whole entire posterior part of his leg is much better much less inflamed and in the case of his Achilles heel area much less tender. He has also had some epithelialization posteriorly there are still open areas here and still draining but overall considerably better 01/31/17- He has continue to tolerate the compression wraps. he states that he continues to use the lymphedema pumps daily, and can increase to twice daily on the weekends. He is voicing no complaints or concerns regarding his LLE ulcers 02/07/17-he is here for follow-up evaluation. He states that he noted some erythema to the left medial and anterior thigh, which he states is new as of yesterday. He is concerned about recurrent cellulitis. He states his blood sugars have been slightly elevated, this morning in the 180s 02/14/17; he is here for follow-up evaluation. When he was last here there was erythema superiorly from his posterior wound in his anterior thigh. He was prescribed Levaquin however a culture of the wound surface  grew MRSA over the phone I changed him to doxycycline on Monday and things seem to be a lot better. 02/24/17; patient missed his appointment on Friday therefore we changed his nurse visit into a physician visit today. Still using silver alginate on the large area of the posterior left thigh. He isn't new area on the dorsal left second toe 03/03/17; actually better today although he admits he has not used his external compression pumps in the last 2 days or so because of work responsibilities over the weekend. 03/10/17; continued improvement. External compression pumps once a day almost all of his wounds have closed on the posterior left calf. Better edema control 03/17/17; in general  improved. He still has 3 small open areas on the lateral aspect of his left leg however most of the area on the posterior part of his leg is epithelialized. He has better edema control. He has an ABD pad under his stocking on the right anterior lower leg although he did not let us look at that today. 03/24/17; patient arrives back in clinic today with no open areas however there are areas on the posterior left calf and anterior left calf that are less than 100% epithelialized. His edema is well controlled in the left lower leg. There is some pitting edema probably lymphedema in the left upper thigh. He uses compression pumps at home once per day. I tried to get him to do this twice a day although he is very reticent. 04/01/2017 -- for the last 2 days he's had significant redness, tenderness and weeping and came in for an urgent visit today. 04/07/17; patient still has 6 more days of doxycycline. He was seen by Dr. Meyer Russel last Wednesday for cellulitis involving the posterior aspect, lateral aspect of his Involving his heel. For the most part he is better there is less erythema and less weeping. He has been on his feet for 12 hours 2 over the weekend. Using his compression pumps once a day 04/14/17 arrives today with continued improvement. Only one area on the posterior left calf that is not fully epithelialized. He has intense bilateral venous inflammation associated with his chronic venous insufficiency disease and secondary lymphedema. We have been using silver alginate to the left posterior calf wound In passing he tells [place] today that the right leg but we have not seen in quite some time has an open area on it but he doesn't want [place] to look at this today states he will show this to [place] next week. 04/21/17; there is no open area on his left leg although he still reports some weeping edema. He showed [place] his right leg today which is the first time we've seen this leg in a long time. He has a large area of  open wound on the right leg anteriorly healthy granulation. Quite a bit of swelling in the right leg and some degree of venous inflammation. He told us about the right leg in passing last week but states that deterioration in the right leg really only happened over the weekend 04/28/17; there is no open area on the left leg although there is an irritated part on the posterior which is like a wrap injury. The wound on the right leg which was new from last week at least to [place] is a lot better. 05/05/17; still no open area on the left leg. Patient is using his new compression stocking which seems to be doing a good job of controlling the edema. He states he  is using his compression pumps once per day. The right leg still has an open wound although it is better in terms of surface area. Required debridement. A lot of pain in the posterior right Achilles marked tenderness. Usually this type of presentation this patient gives concern for an active cellulitis 05/12/17; patient arrives today with his major wound from last week on the right lateral leg somewhat better. Still requiring debridement. He was using his compression stocking on the left leg however that is reopened with superficial wounds anteriorly he did not have an open wound on this leg previously. He is still using his juxta light's once daily at night. He cannot find the time to do this in the morning as he has to be at work by 7 AM 05/19/17; right lateral leg wound looks improved. No debridement required. The concerning area is on the left posterior leg which appears to almost have a subcutaneous hemorrhagic component to it. We've been using silver alginate to all the wounds 05/26/17; the right lateral leg wound continues to look improved. However the area on the left posterior calf is a tightly adherent surface. [place] using silver alginate. Because of the weeping edema in his legs there is very little good alternatives. 06/02/17; the patient left  here last week looking quite good. Major wound on the left posterior calf and a small one on the right lateral calf. Both of these look satisfactory. He tells me that by Wednesday he had noted increased pain in the left leg and drainage. He called on Thursday and Friday to get an appointment here but we were blocked. He did not go to urgent care or his primary physician. He thinks he had a fever on Thursday but did not actually take his temperature. He has not been using his compression pumps on the left leg because of pain. I advised him to go to the emergency room today for IV antibiotics for stents of left leg cellulitis but he has refused I have asked him to take 2 days off work to keep his leg elevated and he has refused this as well. In view of this I'm going to call him and Augmentin and doxycycline. 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 However after a detailed discussion regarding the risk benefits and alternatives he refuses to get admitted to the hospital. With no other recourse I will continue him on oral antibiotics as before and hopefully he'll have his infectious disease consultation this week. 06/16/2017 -- the patient was seen today by the nurse practitioner at infectious disease Ms. Dixon. 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 He has also been advise Lotrimin cream locally. she also had wise IV antibiotics for his condition if it flares up 06/23/17; patient arrives today with drainage bilaterally although the remaining wound on the left posterior calf after  cleaning up today "highlighter yellow drainage" did not look too bad. Unfortunately he has had breakdown on the right anterior leg [previously this leg had not been open and he is using a black stocking] he went  to see infectious disease and is been put on clindamycin 150 mg daily, I did not verify the dose although I'm not familiar with using clindamycin in this dosing range, perhaps for prophylaxisoo 06/27/17; I brought this patient back today to follow-up on the wound deterioration on the right lower leg together with surrounding cellulitis. I started him on doxycycline 4 days ago. This area looks better however he comes in today with intense cellulitis on the medial part of his left thigh. This is not have a wound in this area. Extremely tender. We've been using silver alginate to the wounds on the right lower leg left lower leg with bilateral 4 layer compression he is using his external compression pumps once a day 07/04/17; patient's left medial thigh cellulitis looks better. He has not been using his compression pumps as his insert said it was contraindicated with cellulitis. His right leg continues to make improvements all the wounds are still open. We only have one remaining wound on the left posterior calf. Using silver alginate to all open areas. He is on doxycycline which I started a week ago and should be finishing I gave him Augmentin after Thursday's visit for the severe cellulitis on the left medial thigh which fortunately looks better 07/14/17; the patient's left medial thigh cellulitis has resolved. The cellulitis in his right lower calf on the right also looks better. All of his wounds are stable to improved we've been using silver alginate he has completed the antibiotics I have given him. He has clindamycin 150 mg once a day prescribed by infectious disease for prophylaxis, I've advised him to start this now. We have been using bilateral Unna boots over silver alginate to the wound  areas 07/21/17; the patient is been to see infectious disease who noted his recurrent problems with cellulitis. He was not able to tolerate prophylactic clindamycin therefore he is on amoxicillin 500 twice a day. He also had a second daily dose of Lasix added By Dr. Oneta Rack but he is not taking this. Nor is he being completely compliant with his compression pumps a especially not this week. He has 2 remaining wounds one on the right posterior lateral lower leg and one on the left posterior medial lower leg. 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 The patient has Unna boots bilaterally. 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 He has bilateral Unna boots for compression and silver alginate to his wounds. Arrives today with his legs looking as good as I have seen him in quite some time. Not surprisingly his wounds look better as well with improvement on the right lateral leg venous insufficiency wound and also the left medial leg. He is still using the compression pumps once a day 08/11/17; both legs appear to be doing better wounds on the right lateral and left medial legs look better. Skin on the right leg quite good. He is been using silver alginate as the primary dressing. I'm going to use Anasept gel calcium alginate and maintain all the secondary dressings 08/18/17; the patient continues to actually do quite well. The area on his right lateral leg is just about closed the left medial also looks better although it is still moist in this area. His edema is well controlled we have been using Anasept gel  with calcium alginate and the usual secondary dressings, 4 layer compression and once daily use of his compression pumps "always been able to manage 09/01/17; the patient  continues to do reasonably well in spite of his trip to [place]. The area on the right lateral leg is epithelialized. Left is much better but still open. He has more edema and more chronic erythema on the left leg [venous inflammation] 09/08/17; he arrives today with no open wound on the right lateral leg and decently controlled edema. Unfortunately his left leg is not nearly as in his good situation as last week.he apparently had increasing edema starting on Saturday. He edema soaked through into his foot so used a plastic bag to walk around his home. The area on the medial right leg which was his open area is about the same however he has lost surface epithelium on the left lateral which is new and he has significant pain in the Achilles area of the left foot. He is already on amoxicillin chronically for prophylaxis of cellulitis in the left leg 09/15/17; he is completed a week of doxycycline and the cellulitis in the left posterior leg and Achilles area is as usual improved. He still has a lot of edema and fluid soaking through his dressings. There is no open wound on the right leg. He saw infectious disease NP today 09/22/17;As usual 1 we transition him from our compression wraps to his stockings things did not go well. He has several small open areas on the right leg. He states this was caused by the compression wrap on his skin although he did not wear this with the stockings over them. He has several superficial areas on the left leg medially laterally posteriorly. He does not have any evidence of active cellulitis especially involving the left Achilles The patient is traveling from [facility] Saturday going to [facility]. He states he isn't attempting to get an appointment with a heel objects wound center there to change his dressings. I am not completely certain whether this will work 10/06/17; the patient came in on Friday for a nurse visit and the nurse reported that his legs actually look  quite good. He arrives in clinic today for his regular follow-up visit. He has a new wound on his left third toe over the PIP probably caused by friction with his footwear. He has small areas on the left leg and a very superficial but epithelialized area on the right anterior lateral lower leg. Other than that his legs look as good as I've seen him in quite some time. We have been using silver alginate Review of systems; no chest pain no shortness of breath other than this a 10 point review of systems negative 10/20/17; seen by Dr. Meyer Russel last week. He had taken some antibiotics [doxycycline] that he had left over. Dr. Meyer Russel thought he had candida infection and declined to give him further antibiotics. He has a small wound remaining on the right lateral leg several areas on the left leg including a larger area on the left posterior several left medial and anterior and a small wound on the left lateral. The area on the left dorsal third toe looks a lot better. ROS; Gen.; no fever, respiratory no cough no sputum Cardiac no chest pain other than this 10 point review of system is negative 10/30/17; patient arrives today having fallen in the bathtub 3 days ago. It took him a while to get up. He has pain and maceration in the  wounds on his left leg which have deteriorated. He has not been using his pumps he also has some maceration on the right lateral leg. 11/03/17; patient continues to have weeping edema especially in the left leg. This saturates his dressings which were just put on on 12/27. As usual the doxycycline seems to take care of the cellulitis on his lower leg. He is not complaining of fever, chills, or other systemic symptoms. He states his leg feels a lot better on the doxycycline I gave him empirically. He also apparently gets injections at his primary doctor's officeo Rocephin for cellulitis prophylaxis. I didn't ask him about his compression pump compliance today I think that's probably  marginal. Arrives in the clinic with all of his dressings primary and secondary macerated full of fluid and he has bilateral edema 11/10/17; the patient's right leg looks some better although there is still a cluster of wounds on the right lateral. The left leg is inflamed with almost circumferential skin loss medially to laterally although we are still maintaining anteriorly. He does not have overt cellulitis there is a lot of drainage. He is not using compression pumps. We have been using silver alginate to the wound areas, there are not a lot of options here 11/17/17; the patient's right leg continues to be stable although there is still open wounds, better than last week. The inflammation in the left leg is better. Still loss of surface layer epithelium especially posteriorly. There is no overt cellulitis in the amount of edema and his left leg is really quite good, tells me he is using his compression pumps once a day. 11/24/17; patient's right leg has a small superficial wound laterally this continues to improve. The inflammation in the left leg is still improving however we have continuous surface layer epithelial loss posteriorly. There is no overt cellulitis in the amount of edema in both legs is really quite good. He states he is using his compression pumps on the left leg once a day for 5 out of 7 days 12/01/17; very small superficial areas on the right lateral leg continue to improve. Edema control in both legs is better today. He has continued loss of surface epithelialization and left posterior calf although I think this is better. We have been using silver alginate with large number of absorptive secondary dressings 4 layer on the left Unna boot on the right at his request. He tells me he is using his compression pumps once a day 12/08/17; he has no open area on the right leg is edema control is good here. On the left leg however he has marked erythema and tenderness breakdown of skin. He has  what appears to be a wrap injury just distal to the popliteal fossa. 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 There was swelling erythema and breakdown in the right leg. He has much worse skin breakdown in the left leg as well multiple open areas medially and posteriorly as well as laterally. He tells me he has been using his compression pumps but tells me he feels that the drainage out of his leg is worse when he uses a compression pumps. T be fair to him he is been saying this o for a  while however I don't know that I have really been listening to this. I wonder if the compression pumps are working properly 12/22/17;. Once again he arrives with severe erythema, weeping edema from the left greater than right leg. Noncompliance with compression pumps. New this visit he is complaining of pain on the lateral aspect of the right leg and the medial aspect of his right thigh. He apparently saw his cardiologist Dr. [name] who was ordered an echocardiogram area and I think this is a step in the right direction 12/25/17; started his doxycycline Monday night. There is still intense erythema of the right leg especially in the anterior thigh although there is less tenderness. The erythema around the wound on the right lateral calf also is less tender. He still complaining of pain in the left heel. His wounds are about the same right lateral left medial left lateral. Superficial but certainly not close to closure. He denies being systemically unwell no fever chills no abdominal pain no diarrhea 12/29/17; back in follow-up of his extensive right calf and right thigh cellulitis. I added amoxicillin to cover possible doxycycline resistant  strep. This seems to of done the trick he is in much less pain there is much less erythema and swelling. He has his echocardiogram at 11:00 this morning. X-ray of the left heel was also negative. 01/05/18; the patient arrived with his edema under much better control. Now that he is retired he is able to use his compression pumps daily and sometimes twice a day per the patient. He has a wound on the right leg the lateral wound looks better. Area on the left leg also looks a lot better. He has no evidence of cellulitis in his bilateral thighs I had a quick peak at his echocardiogram. He is in normal ejection fraction and normal left ventricular function. He has moderate pulmonary hypertension moderately reduced right ventricular function. One would have to wonder about chronic sleep apnea although he says he doesn't snore. He'll review the echocardiogram with his cardiologist. 01/12/18; the patient arrives with the edema in both legs under exemplary control. He is using his compression pumps daily and sometimes twice daily. His wound on the right lateral leg is just about closed. He still has some weeping areas on the posterior left calf and lateral left calf although everything is just about closed here as well. I have spoken with Aldean Baker who is the patient's nurse practitioner and infectious disease. She was concerned that the patient had not understood that the parenteral penicillin injections he was receiving for cellulitis prophylaxis was actually benefiting him. I don't think the patient actually saw that I would tend to agree we were certainly dealing with less infections although he had a serious one last month. 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 He is healed. He'll be placed in juxtalite compression wraps and increase his lymphedema pumps to twice daily. We will follow up again next week to ensure there are no issues with the new  regiment. 01/20/18-he is here for evaluation of bilateral lower extremity weeping edema. Yesterday he was placed in compression wrap to the right lower extremity and compression stocking to left lower shrubbery. He states he uses lymphedema pumps last night and again this morning and noted a blister to the left lower extremity. On exam he was noted to have drainage to the right lower extremity. He will be placed in Unna boots bilaterally and follow-up next week 01/26/18; patient was  actually discharged a week ago to his own juxta light stockings only to return the next day with bilateral lower extremity weeping edema.he was placed in bilateral Unna boots. He arrives today with pain in the back of his left leg. There is no open area on the right leg however there is a linear/wrap injury on the left leg and weeping edema on the left leg posteriorly. I spoke with infectious disease about 10 days ago. They were disappointed that the patient elected to discontinue prophylactic intramuscular penicillin shots as they felt it was particularly beneficial in reducing the frequency of his cellulitis. I discussed this with the patient today. He does not share this view. He'll definitely need antibiotics today. Finally he is traveling to [place] and trauma leaving this Saturday and returning a week later and he does not travel with his pumps. He is going by car 01/30/18; patient was seen 4 days ago and brought back in today for review of cellulitis in the left leg posteriorly. I put him on amoxicillin this really hasn't helped as much as I might like. He is also worried because he is traveling to [facility] trauma by car. Finally we will be rewrapping him. There is no open area on the right leg over his left leg has multiple weeping areas as usual 02/09/18; The same wrap on for 10 days. He did not pick up the last doxycycline I prescribed for him. He apparently took 4 days worth he already had. There is nothing open on  his right leg and the edema control is really quite good. He's had damage in the left leg medially and laterally especially probably related to the prolonged use of Unna boots 02/12/18; the patient arrived in clinic today for a nurse visit/wrap change. He complained of a lot of pain in the left posterior calf. He is taking doxycycline that I previously prescribed for him. Unfortunately even though he used his stockings and apparently used to compression pumps twice a day he has weeping edema coming out of the lateral part of his right leg. This is coming from the lower anterior lateral skin area. 02/16/18; the patient has finished his doxycycline and will finish the amoxicillin 2 days. The area of cellulitis in the left calf posteriorly has resolved. He is no longer having any pain. He tells me he is using his compression pumps at least once a day sometimes twice. 02/23/18; the patient finished his doxycycline and Amoxil last week. On Friday he noticed a small erythematous circle about the size of a quarter on the left lower leg just above his ankle. This rapidly expanded and he now has erythema on the lateral and posterior part of the thigh. This is bright red. Also has an area on the dorsal foot just above his toes and a tender area just below the left popliteal fossa. He came off his prophylactic penicillin injections at his own insistence one or 2 months ago. This is obviously deteriorated since then 03/02/18; patient is on doxycycline and Amoxil. Culture I did last week of the weeping area on the back of his left calf grew group B strep. I have therefore renewed the amoxicillin 500 3 times a day for a further week. He has not been systemically unwell. Still complaining of an area of discomfort right under his left popliteal fossa. There is no open wound on the right leg. He tells me that he is using his pumps twice a day on most days 03/09/18; patient arrives in  clinic today completing his amoxicillin  today. The cellulitis on his left leg is better. Furthermore he tells me that he had intramuscular penicillin shots that his primary care office today. However he also states that the wrap on his right leg fell down shortly after leaving clinic last week. He developed a large blister that was present when he came in for a nurse visit later in the week and then he developed intense discomfort around this area.He tells me he is using his compression pumps 03/16/18; the patient has completed his doxycycline. The infectious part of this/cellulitis in the left heel area left popliteal area is a lot better. He has 2 open areas on the right calf. Still areas on the left calf but this is a lot better as well. 03/24/18; the patient arrives complaining of pain in the left popliteal area again. He thinks some of this is wrap injury. He has no open area on the right leg and really no open area on the left calf either except for the popliteal area. He claims to be compliant with the compression pumps 03/31/18; I gave him doxycycline last week because of cellulitis in the left popliteal area. This is a lot better although the surface epithelium is denuded off and response to this. He arrives today with uncontrolled edema in the right calf area as well as a fingernail injury in the right lateral calf. There is only a few open areas on the left 04/06/18; I gave him amoxicillin doxycycline over the last 2 weeks that the amoxicillin should be completing currently. He is not complaining of any pain or systemic symptoms. The only open areas see has is on the right lateral lower leg paradoxically I cannot see anything on the left lower leg. He tells me he is using his compression pumps twice a day on most days. Silver alginate to the wounds that are open under 4 layer compression 04/13/18; he completed antibiotics and has no new complaints. Using his compression pumps. Silver alginate that anything that's opened 04/20/18; he is  using his compression pumps religiously. Silver alginate 4 layer compression anything that's opened. He comes in today with no open wounds on the left leg but 3 on the right including a new one posteriorly. He has 2 on the right lateral and one on the right posterior. He likes Unna boots on the right leg for reasons that aren't really clear we had the usual 4 layer compression on the left. It may be necessary to move to the 4 layer compression on the right however for now I left them in the Unna boots 04/27/18; he is using his compression pumps at least once a day. He has still the wounds on the right lateral calf. The area right posteriorly has closed. He does not have an open wound on the left under 4 layer compression however on the dorsal left foot just proximal to the toes and the left third toe 2 small open areas were identified 05/11/18; he has not uses compression pumps. The areas on the right lateral calf have coalesced into one large wound necrotic surface. On the left side he has one small wound anteriorly however the edema is now weeping out of a large part of his left leg. He says he wasn't using his pumps because of the weeping fluid. I explained to him that this is the time he needs to pump more 05/18/18; patient states he is using his compression pumps twice a day. The area on  the right lateral large wound albeit superficial. On the left side he has innumerable number of small new wounds on the left calf particularly laterally but several anteriorly and medially. All these appear to have healthy granulated base these look like the remnants of blisters however they occurred under compression. The patient arrives in clinic today with his legs somewhat better. There is certainly less edema, less multiple open areas on the left calf and the right anterior leg looks somewhat better as well superficial and a little smaller. However he relates pain and erythema over the last 3-4 days in the thigh  and I looked at this today. He has not been systemically unwell no fever no chills no change in blood sugar values 05/25/18; comes in today in a better state. The severe cellulitis on his left leg seems better with the Keflex. Not as tender. He has not been systemically unwell Hard to find an open wound on the left lower leg using his compression pumps twice a day The confluent wounds on his right lateral calf somewhat better looking. These will ultimately need debridement I didn't do this today. 06/01/18; the severe cellulitis on the left anterior thigh has resolved and he is completed his Keflex. There is no open wound on the left leg however there is a superficial excoriation at the base of the third toe dorsally. Skin on the bottom of his left foot is macerated looking. The left the wounds on the lateral right leg actually looks some better although he did require debridement of the top half of this wound area with an open curet 06/09/18 on evaluation today patient appears to be doing poorly in regard to his right lower extremity in particular this appears to likely be infected he has very thick purulent discharge along with a bright green tent to the discharge. This makes me concerned about the possibility of pseudomonas. He's also having increased discomfort at this point on evaluation. Fortunately there does not appear to be any evidence of infection spreading to the other location at this time. 06/16/18 on evaluation today patient appears to actually be doing fairly well. His ulcer has actually diminished in size quite significantly at this point which is good news. Nonetheless he still does have some evidence of infection he did see infectious disease this morning before coming here for his appointment. I did review the results of their evaluation and their note today. They did actually have him discontinue the Cipro and initiate treatment with linezolid at this time. He is doing this for the  next seven days and they recommended a follow-up in four months with them. He is the keep a log of the need for intermittent antibiotic therapy between now and when he falls back up with infectious disease. This will help them gaze what exactly they need to do to try and help them out. 06/23/18; the patient arrives today with no open wounds on the left leg and left third toe healed. He is been using his compression pumps twice a day. On the right lateral leg he still has a sizable wound but this is a lot better than last time I saw this. 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 Has been using silver alginate under 4 layer compression 06/30/18; the only open wound he has is on the right lateral leg and this looks healthy. No debridement is required. We have  been using silver alginate. He does not have an open wound on the left leg. There is apparently some drainage from the dorsal proximal third toe on the left although I see no open wound here. 07/03/18 on evaluation today patient was actually here just for a nurse visit rapid change. However when he was here on Wednesday for his rat change due to having been healed on the left and then developing blisters we initiated the wrap again knowing that he would be back today for [place] to reevaluate and see were at. Unfortunately he has developed some cellulitis into the proximal portion of his right lower extremity even into the region of his thigh. 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 He was placed on one as what at that point. Nonetheless he is done with that and has been tolerating it well otherwise. Doxycycline which in the past really did not seem to be effective for him. Nonetheless I think the best option may be for [place] to definitely reinitiate the antibiotics for a longer period of time. 07/07/18; since I  last saw this patient a week ago he has had a difficult time. At that point he did not have an open wound on his left leg. We transitioned him into juxta light stockings. He was apparently in the clinic the next day with blisters on the left lateral and left medial lower calf. He also had weeping edema fluid. He was put back into a compression wrap. He was also in the clinic on Friday with intense erythema in his right thigh. Per the patient he was started on Bactrim however that didn't work at all in terms of relieving his pain and swelling. He has taken 3 doxycycline that he had left over from last time and that seems to of helped. He has blistering on the right thigh as well. 07/14/18; the erythema on his right thigh has gotten better with doxycycline that he is finishing. 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 Presumably this cellulitis in the thigh was not related to that although I think this is a bit concerning going forward. He still has an area on the right lateral calf the blister on the right medial calf just below the knee that was discussed above. On the left 2 small open areas left medial and left lateral. Edema control is adequate. He is using his compression pumps twice a day 07/20/18; continued improvement in the condition of both legs especially the edema in his bilateral thighs. He tells me he is been losing weight through a combination of diet and exercise. He is using his compression pumps twice a day. So overall she made to the remaining wounds 07/27/2018; continued improvement in condition of both legs. His edema is well controlled. The area on the right lateral leg is just about closed he had one blisters show up on the medial left upper calf. We have him in 4 layer compression. He is going on a 10-day trip to [place], [place]. He will be driving. He wants to wear Unna boots because of  the lessening amount of constriction. He will not use compression pumps while he is away 08/05/18 on evaluation today patient actually appears to be doing decently well all things considered in regard to his bilateral lower extremities. The worst ulcer is actually only posterior aspect of  his left lower extremity with a four layer compression wrap cut into his leg a couple weeks back. He did have a trip and actually had [employer] for the trip that he is worn since he was last here. Nonetheless he feels like the [employer] actually do better for him his swelling is up a little bit but he also with his trip was not taking his Lasix on a regular set schedule like he was supposed to be. He states that obviously the reason being that he cannot drive and keep going without having to urinate too frequently which makes it difficult. He did not have his pumps with him while he was away either which I think also maybe playing a role here too. 08/13/2018; the patient only has a small open wound on the right lateral calf which is a big improvement in the last month or 2. He also has the area posteriorly just below the posterior fossa on the left which I think was a wrap injury from several weeks ago. He has no current evidence of cellulitis. He tells me he is back into his compression pumps twice a day. He also tells me that while he was at the laundromat somebody stole a section of his extremitease stockings 08/20/2018; back in the clinic with a much improved state. He only has small areas on the right lateral mid calf which is just about healed. This was is more substantial area for quite a prolonged period of time. He has a small open area on the left anterior tibia. The area on the posterior calf just below the popliteal fossa is closed today. He is using his compression pumps twice a day 08/28/2018; patient has no open wound on the right leg. He has a smattering of open areas on the calf with some  weeping lymphedema. More problematically than that it looks as though his wraps of slipped down in his usual he has very angry upper area of edema just below the right medial knee and on the right lateral calf. He has no open area on his feet. The patient is traveling to [facility] next week. I will send him in an antibiotic. We will continue to wrap the right leg. We ordered extremitease stockings for him last week and I plan to transition the right leg to a stocking when he gets home which will be in 10 days time. As usual he is very reluctant to take his pumps with him when he travels 09/07/2018; patient returns from [facility]. He shows me a picture of his left leg in the mid part of his trip last week with intense fire engine erythema. The picture look bad enough I would have considered sending him to the hospital. Instead he went to the wound care center in [facility]. They did not prescribe him antibiotics but he did take some doxycycline he had leftover from a previous visit. I had given him trimethoprim sulfamethoxazole before he left this did not work according to the patient. This is resulted in some improvement fortunately. He comes back with a large wound on the left posterior calf. Smaller area on the left anterior tibia. Denuded blisters on the dorsal left foot over his toes. Does not have much in the way of wounds on the right leg although he does have a very tender area on the right posterior area just below the popliteal fossa also suggestive of infection. He promises me he is back on his  pumps twice a day 09/15/2018; the intense cellulitis in his left lower calf is a lot better. The wound area on the posterior left calf is also so better. However he has reasonably extensive wounds on the dorsal aspect of his second and third toes and the proximal foot just at the base of the toes. There is nothing open on the right leg 09/22/2018; the patient has excellent edema  control in his legs bilaterally. He is using his external compression pumps twice a day. He has no open area on the right leg and only the areas in the left foot dorsally second and third toe area on the left side. He does not have any signs of active cellulitis. 10/06/2018; the patient has good edema control bilaterally. He has no open wound on the right leg. There is a blister in the posterior aspect of his left calf that we had to deal with today. He is using his compression pumps twice a day. There is no signs of active cellulitis. We have been using silver alginate to the wound areas. He still has vulnerable areas on the base of his left first second toes dorsally He has a his extremities stockings and we are going to transition him today into the stocking on the right leg. He is cautioned that he will need to continue to use the compression pumps twice a day. If he notices uncontrolled edema in the right leg he may need to go to 3 times a day. 10/13/2018; the patient came in for a nurse check on Friday he has a large flaccid blister on the right medial calf just below the knee. We unroofed this. He has this and a new area underneath the posterior mid calf which was undoubtedly a blister as well. He also has several small areas on the right which is the area we put his extremities stocking on. 10/19/2018; the patient went to see infectious disease this morning I am not sure if that was a routine follow-up in any case the doxycycline I had given him was discontinued and started on linezolid. He has not started this. It is easy to look at his left calf and the inflammation and think this is cellulitis however he is very tender in the tissue just below the popliteal fossa and I have no doubt that there is infection going on here. He states the problem he is having is that with the compression pumps the edema goes down and then starts walking the wrap falls down. We will see if we can adhere this. He  has 1 or 2 minuscule open areas on the right still areas that are weeping on the posterior left calf, the base of his left second and third toes 10/26/18; back today in clinic with quite of skin breakdown in his left anterior leg. This may have been infection the area below the popliteal fossa seems a lot better however tremendous epithelial loss on the left anterior mid tibia area over quite inexpensive tissue. He has 2 blisters on the right side but no other open wound here. 10/29/2018; came in urgently to see [place] today and we worked him in for review. He states that the 4 layer compression on the right leg caused pain he had to cut it down to roughly his mid calf this caused swelling above the wrap and he has blisters and skin breakdown today. As a result of the pain he has not been using his pumps. Both legs are a lot more  edematous and there is a lot of weeping fluid. 11/02/18; arrives in clinic with continued difficulties in the right leg> left. Leg is swollen and painful. multiple skin blisters and new open areas especially laterally. He has not been using his pumps on the right leg. 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 He is not systemically unwell. 11/09/2018; the patient claims he is being compliant with his pumps. He is finished the doxycycline I gave him last week. Culture I did of the wound on the right lateral leg showed a few very resistant methicillin staph aureus. This was resistant to doxycycline. Nevertheless he states the pain in the leg is a lot better which makes me wonder if the cultured organism was not really what was causing the problem nevertheless this is a very dangerous organism to be culturing out of any wound. His right leg is still a lot larger than the left. He is using an Radio broadcast assistant on this area, he blames a 4-layer compression for causing the original skin breakdown which I doubt is true however I cannot talk him out  of it. We have been using silver alginate to all of these areas which were initially blisters 11/16/2018; patient is being compliant with his external compression pumps at twice a day. Miraculously he arrives in clinic today with absolutely no open wounds. He has better edema control on the left where he has been using 4 layer compression versus wound of wounds on the right and I pointed this out to him. There is no inflammation in the skin in his lower legs which is also somewhat unusual for him. There is no open wounds on the dorsal left foot. He has extremitease stockings at home and I have asked him to bring these in next week. 11/25/18 patient's lower extremity on examination today on the left appears for the most part to be wound free. He does have an open wound on the lateral aspect of the right lower extremity but this is minimal compared to what I've seen in past. He does request that we go ahead and wrap the left leg as well even though there's nothing open just so hopefully it will not reopen in short order. 1/28; patient has superficial open wounds on the right lateral calf left anterior calf and left posterior calf. His edema control is adequate. 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 We have been using silver alginate as the primary dressing. He claims compliance with his compression pumps 2/4; patient has superficial open wounds on numerous areas of his left calf and again one on the left dorsal foot. The areas on the right lateral calf have healed. The cellulitis that I gave him doxycycline for last week is also resolved this was mostly on the left anterior calf just below the tibial tuberosity. His edema looks fairly well-controlled. He tells me he went to see his primary doctor today and had blood work ordered 2/11; once again he has several open areas on the left calf left tibial area. Most of  these are small and appear to have healthy granulation. He does not have anything open on the right. The edema and control in his thighs is pretty good which is usually a good indication he has been using his pumps as requested. 2/18; he continues to have several small areas on the left calf and left tibial  area. Most of these are small healthy granulation. We put him in his stocking on the right leg last week and he arrives with a superficial open area over the right upper tibia and a fairly large area on the right lateral tibia in similar condition. His edema control actually does not look too bad, he claims to be using his compression pumps twice a day 2/25. Continued small areas on the left calf and left tibial area. New areas especially on the right are identified just below the tibial tuberosity and on the right upper tibia itself. There are also areas of weeping edema fluid even without an obvious wound. He does not have a considerable degree of lymphedema but clearly there is more edema here than his skin can handle. He states he is using the pumps twice a day. We have an Unna boot on the right and 4 layer compression on the left. 3/3; he continues to have an area on the right lateral calf and right posterior calf just below the popliteal fossa. There is a fair amount of tenderness around the wound on the popliteal fossa but I did not see any evidence of cellulitis, could just be that the wrap came down and rubbed in this area. He does not have an open area on the left leg however there is an area on the left dorsal foot at the base of the third toe We have been using silver alginate to all wound areas 3/10; he did not have an open area on his left leg last time he was here a week ago. T oday he arrives with a horizontal wound just below the tibial tuberosity and an area on the left lateral calf. He has intense erythema and tenderness in this area. The area is on the right lateral calf and  right posterior calf better than last week. We have been using silver alginate as usual 3/18 - Patient returns with 3 small open areas on left calf, and 1 small open area on right calf, the skin looks ok with no significant erythema, he continues the UNA boot on right and 4 layer compression on left. The right lateral calf wound is closed , the right posterior is small area. we will continue silver alginate to the areas. 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 He is been tolerating the dressing changes without complication. Fortunately she has made excellent improvement in regard to the overall status of his wounds. Unfortunately every time we cease wrapping him he ends up reopening in causing more significant issues at that point. Again I'm unsure of the best direction to take although I think the lymphedema clinic may be appropriate for him. 02/03/19 on evaluation today patient appears to be doing well in regard to the wounds that we saw him for last week unfortunately he has a new area on the proximal portion of his right medial/posterior lower extremity where the wrap somewhat slowed down and caused swelling and a blister to rub and open. Unfortunately this is the only opening that he has on either leg at this point. 02/17/19 on evaluation today patient's bilateral lower extremities appear to be doing well. He still completely healed in regard to the left lower extremity. In regard to the right lower extremity the area where the wrap and slid down  and caused the blister still seems to be slightly open although this is dramatically better than during the last evaluation two weeks ago. I'm very pleased with the way this stands overall. 03/03/19 on evaluation today patient appears to be doing well in regard  to his right lower extremity in general although he did have a new blister open this does not appear to be showing any evidence of active infection at this time. Fortunately there's No fevers, chills, nausea, or vomiting noted at this time. Overall I feel like he is making good progress it does feel like that the right leg will we perform the [employer] seems to do with a bit better than three layer wrap on the left which slid down on him. We may switch to doing bilateral in the book wraps. 5/4; I have not seen Mr. Zelada in quite some time. According to our case manager he did not have an open wound on his left leg last week. He had 1 remaining wound on the right posterior medial calf. He arrives today with multiple openings on the left leg probably were blisters and/or wrap injuries from Unna boots. I do not think the Unna boot's will provide adequate compression on the left. I am also not clear about the frequency he is using the compression pumps. 03/17/19 on evaluation today patient appears to be doing excellent in regard to his lower extremities compared to last week's evaluation apparently. He had gotten significantly worse last week which is unfortunate. The [employer] wrap on the left did not seem to do very well for him at all and in fact it didn't control his swelling significantly enough he had an additional outbreak. Subsequently we go back to the four layer compression wrap on the left. This is good news. At least in that he is doing better and the wound seem to be killing him. He still has not heard anything from the lymphedema clinic. 03/24/19 on evaluation today patient actually appears to be doing much better in regard to his bilateral lower Trinity as compared to last week when I saw him. Fortunately there's no signs of active infection at this time. He has been tolerating the dressing changes without complication. Overall I'm extremely pleased with the progress and appearance in  general. 04/07/19 on evaluation today patient appears to be doing well in regard to his bilateral lower extremities. His swelling is significantly down from where it was previous. With that being said he does have a couple blisters still open at this point but fortunately nothing that seems to be too severe and again the majority of the larger openings has healed at this time. 04/14/19 on evaluation today patient actually appears to be doing quite well in regard to his bilateral lower extremities in fact I'm not even sure there's anything significantly open at this time at any site. Nonetheless he did have some trouble with these wraps where they are somewhat irritating him secondary to the fact that he has noted that the graph wasn't too close down to the end of this foot in a little bit short as well up to his knee. Otherwise things seem to be doing quite well. 04/21/19 upon evaluation today patient's wound bed actually showed evidence of being completely healed in regard to both lower extremities which is excellent news. There does not appear to be any signs of active infection which is also good news. I'm very pleased in this regard. No fevers, chills, nausea, or vomiting noted  at this time. 04/28/19 on evaluation today patient appears to be doing a little bit worse in regard to both lower extremities on the left mainly due to the fact that when he went infection disease the wrap was not wrapped quite high enough he developed a blister above this. On the right he is a small open area of nothing too significant but again this is continuing to give him some trouble he has been were in the Velcro compression that he has at home. 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 He's been tolerating the dressing changes without complication. Fortunately there's no signs of active infection at this time. No fevers, chills, nausea, or vomiting noted at  this time. We have been trying to get an appointment with her lymphedema clinic in [facility] but unfortunately nobody can get them on phone with not been able to even fax information over the patient likewise is not been able to get in touch with them. Overall I'm not sure exactly what's going on here with to reach out again today. 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 Still having a lot of drainage unfortunately. He tells me especially in the left but even on the right. There's no signs of active infection which is good news we've been using so ratcheted up to this point. 05/19/19 on evaluation today patient actually appears to be doing quite well with regard to his left lower extremity which is great news. Fortunately in regard to the right lower extremity has an issues with his wrap and he subsequently did remove this from what I'm understanding. Nonetheless long story short is what he had rewrapped once he removed it subsequently had maggots underneath this wrap whenever he came in for evaluation today. With that being said they were obviously completely cleaned away by the nursing staff. The visit today which is excellent news. However he does appear to potentially have some infection around the right ankle region where the maggots were located as well. He will likely require anabiotic therapy today. 05/26/19 on evaluation today patient actually appears to be doing much better in regard to his bilateral lower extremities. I feel like the infection is under much better control. With that being said there were maggots noted when the wrap was removed yet again today. Again this could have potentially been left over from previous although at this time there does not appear to be any signs of significant drainage there was obviously on the wrap some drainage as well this contracted gnats or  otherwise. Either way I do not see anything that appears to be doing worse in my pinion and in fact I think his drainage has slowed down quite significantly likely mainly due to the fact to his infection being under better control. 06/02/2019 on evaluation today patient actually appears to be doing well with regard to his bilateral lower extremities there is no signs of active infection at this time which is great news. With that being said he does have several open areas more so on the right than the left but nonetheless these are all significantly better than previously noted. 06/09/2019 on evaluation today patient actually appears to be doing well. His wrap stayed up and he did not cause any problems he had more drainage on the right compared to the left but overall I do not see  any major issues at this time which is great news. 06/16/2019 on evaluation today patient appears to be doing excellent with regard to his lower extremities the only area that is open is a new blister that can have opened as of today on the medial ankle on the left. Other than this he really seems to be doing great I see no major issues at this point. 06/23/2019 on evaluation today patient appears to be doing quite well with regard to his bilateral lower extremities. In fact he actually appears to be almost completely healed there is a small area of weeping noted of the right lower extremity just above the ankle. Nonetheless fortunately there is no signs of active infection at this time which is good news. No fevers, chills, nausea, vomiting, or diarrhea. 8/24; the patient arrived for a nurse visit today but complained of very significant pain in the left leg and therefore I was asked to look at this. Noted that he did not have an open area on the left leg last week nevertheless this was wrapped. The patient states that he is not been able to put his compression pumps on the left leg because of the discomfort. He has not been  systemically unwell 06/30/2019 on evaluation today patient unfortunately despite being excellent last week is doing much worse with regard to his left lower extremity today. In fact he had to come in for a nurse on Monday where his left leg had to be rewrapped due to excessive weeping Dr. Leanord Hawking placed him on doxycycline at that point. Fortunately there is no signs of active infection Systemically at this time which is good news. 07/07/2019 in regard to the patient's wounds today he actually seems to be doing well with his right lower extremity there really is nothing open or draining at this point this is great news. Unfortunately the left lower extremity is given him additional trouble at this time. There does not appear to be any signs of active infection nonetheless he does have a lot of edema and swelling noted at this point as well as blistering all of which has led to a much more poor appearing leg at this time compared to where it was 2 weeks ago when it was almost completely healed. Obviously this is a little discouraging for the patient. He is try to contact the lymphedema clinic in [place] he has not been able to get through to them. 07/14/2019 on evaluation today patient actually appears to be doing slightly better with regard to his left lower extremity ulcers. Overall I do feel like at least at the top of the wrap that we have been placing this area has healed quite nicely and looks much better. The remainder of the leg is showing signs of improvement. Unfortunately in the thigh area he still has an open region on the left and again on the right he has been utilizing just a Band-Aid on an area that also opened on the thigh. Again this is an area that were not able to wrap although we did do an Ace wrap to provide some compression that something that obviously is a little less effective than the compression wraps we have been using on the lower portion of the leg. He does have an  appointment with the lymphedema clinic in [facility] on Friday. 07/21/2019 on evaluation today patient appears to be doing better with regard to his lower extremity ulcers. He has been tolerating the dressing changes without complication. Fortunately there is  no signs of active infection at this time. No fevers, chills, nausea, vomiting, or diarrhea. I did receive the paperwork from the physical therapist at the lymphedema clinic in [place]. Subsequently I signed off on that this morning and sent that back to him for further progression with the treatment plan. 07/28/2019 on evaluation today patient appears to be doing very well with regard to his right lower extremity where I do not see any open wounds at this point. Fortunately he is feeling great as far as that is concerned as well. In regard to the left lower extremity he has been having issues with still several areas of weeping and edema although the upper leg is doing better his lower leg still I think is going require the compression wrap at this time. No fevers, chills, nausea, vomiting, or diarrhea. 08/04/2019 on evaluation today patient unfortunately is having new wounds on the right lower extremity. Again we have been using Unna boot wrap on that side. We switched him to using his juxta lite wrap at home. With that being said he tells me he has been using it although his legs extremely swollen and to be honest really does not appear that he has been. I cannot know that for sure however. Nonetheless he has multiple new wounds on the right lower extremity at this time. Obviously we will have to see about getting this rewrapped for him today. 08/11/2019 on evaluation today patient appears to be doing fairly well with regard to his wounds. He has been tolerating the dressing changes including the compression wraps without complication. He still has a lot of edema in his upper thigh regions bilaterally he is supposed to be seeing the  lymphedema clinic on the 15th of this month once his wraps arrive for the upper part of his legs. 08/18/2019 on evaluation today patient appears to be doing well with regard to his bilateral lower extremities at this point. He has been tolerating the dressing changes without complication. Fortunately there is no signs of active infection which is also good news. He does have a couple weeping areas on the first and second toe of the right foot he also has just a small area on the left foot upper leg and a small area on the left lower leg but overall he is doing quite well in my opinion. He is supposed to be getting his wraps shortly in fact tomorrow and then subsequently is seeing the lymphedema clinic next Wednesday on the 21st. Of note he is also leaving on the 25th to go on vacation for a [place] to the beach. For that reason and since there is some uncertainty about what there can be doing at lymphedema clinic next Wednesday I am get a make an appointment for next Friday here for [place] to see what we need to do for him prior to him leaving for vacation. 10/23; patient arrives in considerable pain predominantly in the upper posterior calf just distal to the popliteal fossa also in the wound anteriorly above the major wound. This is probably cellulitis and he has had this recurrently in the past. He has no open wound on the right side and he has had an Radio broadcast assistant in that area. Finally I note that he has an area on the left posterior calf which by enlarge is mostly epithelialized. This protrudes beyond the borders of the surrounding skin in the setting of dry scaly skin and lymphedema. The patient is leaving for [facility] on Sunday. Per  his longstanding pattern, he will not take his compression pumps with him predominantly out of fear that they will be stolen. He therefore asked that we put a Unna boot back on the right leg. He will also contact the wound care center in [facility] to see if they can  change his dressing in the mid week. 11/3; patient returned from his vacation to [facility]. He was seen on 1 occasion at their wound care center. They did a 2 layer compression system as they did not have our 4-layer wrap. I am not completely certain what they put on the wounds. They did not change the Unna boot on the right. The patient is also seeing a lymphedema specialist physical therapist in [place]. It appears that he has some compression sleeve for his thighs which indeed look quite a bit better than I am used to seeing. He pumps over these with his external compression pumps. 11/10; the patient has a new wound on the right medial thigh otherwise there is no open areas on the right. He has an area on the left leg posteriorly anteriorly and medially and an area over the left second toe. We have been using silver alginate. He thinks the injury on his thigh is secondary to friction from the compression sleeve he has. 11/17; the patient has a new wound on the right medial thigh last week. He thinks this is because he did not have a underlying stocking for his thigh juxta lite apparatus. He now has this. The area is fairly large and somewhat angry but I do not think he has underlying cellulitis. He has a intact blister on the right anterior tibial area. Small wound on the right great toe dorsally Small area on the medial left calf. 11/30; the patient does not have any open areas on his right leg and we did not take his juxta lite stocking off. However he states that on Friday his compression wrap fell down lodging around his upper mid calf area. As usual this creates a lot of problems for him. He called urgently today to be seen for a nurse visit however the nurse visit turned into a provider visit because of extreme erythema and pain in the left anterior tibia extending laterally and posteriorly. The area that is problematic is extensive 10/06/2019 upon evaluation today patient actually  appears to be doing poorly in regard to his left lower extremity. He Dr. Leanord Hawking did place him on doxycycline this past Monday apparently due to the fact that he was doing much worse in regard to this left leg. Fortunately the doxycycline does seem to be helping. Unfortunately we are still having a very difficult time getting his edema under any type of control in order to anticipate discharge at some point. The only way were really able to control his lymphedema really is with compression wraps and that has only even seemingly temporary. He has been seeing a lymphedema clinic they are trying to help in this regard but still this has been somewhat frustrating in general for the patient. 10/13/19 on evaluation today patient appears to be doing excellent with regard to his right lower extremity as far as the wounds are concerned. His swelling is still quite extensive unfortunately. He is still having a lot of drainage from the thigh areas bilaterally which is unfortunate. He's been going to lymphedema clinic but again he still really does not have this edema under control as far as his lower extremities are concern. With regard  to his left lower extremity this seems to be improving and I do believe the doxycycline has been of benefit for him. He is about to complete the doxycycline. 10/20/2019 on evaluation today patient appears to be doing poorly in regard to his bilateral lower extremities. More in the right thigh he has a lot of irritation at this site unfortunately. In regard to the left lower extremity the wrap was not quite as high it appears and does seem to have caused him some trouble as well. Fortunately there is no evidence of systemic infection though he does have some blue-green drainage which has me concerned for the possibility of Pseudomonas. He tells me he is previously taking Cipro without complications and he really does not care for Levaquin however due to some of the side effects he  has. He is not allergic to any medications specifically antibiotics that were aware of. 10/27/2019 on evaluation today patient actually does appear to be for the most part doing better when compared to last week's evaluation. With that being said he still has multiple open wounds over the bilateral lower extremities. He actually forgot to start taking the Cipro and states that he still has the whole bottle. He does have several new blisters on left lower extremity today I think I would recommend he go ahead and take the Cipro based on what I am seeing at this point. 12/30-Patient comes at 1 week visit, 4 layer compression wraps on the left and Unna boot on the right, primary dressing Xtrasorb and silver alginate. Patient is taking his Cipro and has a few more days left probably 5-6, and the legs are doing better. He states he is using his compressions devices which I believe he has 11/10/2019 on evaluation today patient actually appears to be much better than last time I saw him 2 weeks ago. His wounds are significantly improved and overall I am very pleased in this regard. Fortunately there is no signs of active infection at this time. He is just a couple of days away from completing Cipro. Overall his edema is much better he has been using his lymphedema pumps which I think is also helping at this point. 11/17/2019 on evaluation today patient appears to be doing excellent in regard to his wounds in general. His legs are swollen but not nearly as much as they have been in the past. Fortunately he is tolerating the compression wraps without complication. No fevers, chills, nausea, vomiting, or diarrhea. He does have some erythema however in the distal portion of his right lower extremity specifically around the forefoot and toes there is a little bit of warmth here as well. 11/24/2019 on evaluation today patient appears to be doing well with regard to his right lower extremity I really do not see any open  wounds at this point. His left lower extremity does have several open areas and his right medial thigh also is open. Other than this however overall the patient seems to be making good progress and I am very pleased at this point. 12/01/2019 on evaluation today patient appears to be doing poorly at this point in regard to his left lower extremity has several new blisters despite the fact that we have him in compression wraps. In fact he had a 4-layer compression wrap, his upper thigh wrapped from lymphedema clinic, and a juxta light over top of the 4 layer compression wrap the lymphedema clinic applied and despite all this he still develop blisters underneath. Obviously this does  have me concerned about the fact that unfortunately despite what we are doing to try to get wounds healed he continues to have new areas arise I do not think he is ever good to be at the point where he can realistically just use wraps at home to keep things under control. Typically when we heal him it takes about 1-2 days before he is back in the clinic with severe breakdown and blistering of his lower extremities bilaterally. This is happened numerous times in the past. Unfortunately I think that we may need some help as far as overall fluid overload to kind of limit what we are seeing and get things under better control. 12/08/2019 on evaluation today patient presents for follow-up concerning his ongoing bilateral lower extremity edema. Unfortunately he is still having quite a bit of swelling the compression wraps are controlling this to some degree but he did see Dr. [name] his cardiologist I do have that available for review today as far as the appointment was concerned that was on 12/06/2019. Obviously that she has been 2 days ago. The patient states that he is only been taking the Lasix 80 mg 1 time a day he had told me previously he was taking this twice a day. Nonetheless Dr. [name] recommended this be up to 80 mg 2 times a  day for the patient as he did appear to be fluid overloaded. With that being said the patient states he did this yesterday and he was unable to go anywhere or do anything due to the fact that he was constantly having to urinate. Nonetheless I think that this is still good to be something that is important for him as far as trying to get his edema under control at all things that he is going to be able to just expect his wounds to get under control and things to be better without going through at least a period of time where he is trying to stabilize his fluid management in general and I think increasing the Lasix is likely the first step here. It was also mentioned the possibility that the patient may require metolazone. With that being said he wanted to have the patient take Lasix twice a day first and then reevaluating 2 months to see where things stand. 12/15/2019 upon evaluation today patient appears to be doing regard to his legs although his toes are showing some signs of weeping especially on the left at this point to some degree on the right. There does not appear to be any signs of active infection and overall I do feel like the compression wraps are doing well for him but he has not been able to take the Lasix at home and the increased dose that Dr. [name] recommended. He tells me that just not go to be feasible for him. Nonetheless I think in this case he should probably send a message to Dr. [name] in order to discuss options from the standpoint of possible admission to get the fluid off or otherwise going forward. 12/22/2019 upon evaluation today patient appears to be doing fairly well with regard to his lower extremities at this point. In fact he would be doing excellent if it was not for the fact that his right anterior thigh apparently had an allergic reaction to adhesive tape that he used. The wound itself that we have been monitoring actually appears to be healed. There is a lot of  irritation at this point. 12/29/2019 upon evaluation today patient appears to  be doing well in regard to his lower extremities. His left medial thigh is open and somewhat draining today but this is the only region that is open the right has done much better with the treatment utilizing the steroid cream that I prescribed for him last week. Overall I am pleased in that regard. Fortunately there is no signs of active infection at this time. No fevers, chills, nausea, vomiting, or diarrhea. 01/05/2020 upon evaluation today patient appears to be doing more poorly in regard to his right lower extremity at this point upon evaluation today. Unfortunately he continues to have issues in this regard and I think the biggest issue is controlling his edema. This obviously is not very well controlled at this point is been recommended that he use the Lasix twice a day but he has not been able to do that. Unfortunately I think this is leading to an issue where honestly he is not really able to effectively control his edema and therefore the wounds really are not doing significantly better. I do not think that he is going to be able to keep things under good control unless he is able to control his edema much better. I discussed this again in great detail with him today. 01/12/2020 good news is patient actually appears to be doing quite well today at this point. He does have an appointment with lymphedema clinic tomorrow. His legs appear healed and the toe on the left is almost completely healed. In general I am very pleased with how things stand at this point. 01/19/2020 upon evaluation today patient appears to actually be doing well in regard to his lower extremities there is nothing open at this point. Fortunately he has done extremely well more recently. Has been seeing lymphedema clinic as well. With that being said he has Velcro wraps for his lower legs as well as his upper legs. The only wound really is on his toe  which is the right great toe and this is barely anything even there. With all that being said I think it is good to be appropriate today to go ahead and switch him over to the Velcro compression wraps. 01/26/2020 upon evaluation today patient appears to be doing worse with regard to his lower extremities after last week switch him to Velcro compression wraps. Unfortunately he lasted less than 24 hours he did not have the sock portion of his Velcro wrap on the left leg and subsequently developed a blister underneath the Velcro portion. Obviously this is not good and not what we were looking for at this point. He states the lymphedema clinic did tell him to wear the wrap for 23 hours and take him off for 1 I am okay with that plan but again right now we got a get things back under control again he may have some cellulitis noted as well. 02/02/2020 upon evaluation today patient unfortunately appears to have several areas of blistering on his bilateral lower extremities today mainly on the feet. His legs do seem to be doing somewhat better which is good news. Fortunately there is no evidence of active infection at this time. No fevers, chills, nausea, vomiting, or diarrhea. 02/16/2020 upon evaluation today patient appears to be doing well at this time with regard to his legs. He has a couple weeping areas on his toes but for the most part everything is doing better and does appear to be sealed up on his legs which is excellent news. We can continue with wrapping him  at this point as he had every time we discontinue the wraps he just breaks out with new wounds. There is really no point in is going forward with this at this point. 03/08/2020 upon evaluation today patient actually appears to be doing quite well with regard to his lower extremity ulcers. He has just a very superficial and really almost nonexistent blister on the left lower extremity he has in general done very well with the compression wraps. With  that being said I do not see any signs of infection at this time which is good news. 03/29/2020 upon evaluation today patient appears to be doing well with regard to his wounds currently except for where he had several new areas that opened up due to some of the wrap slipping and causing him trouble. He states he did not realize they had slipped. Nonetheless he has a 1 area on the right and 3 new areas on the left. Fortunately there is no signs of active infection at this time which is great news. 04/05/2020 upon evaluation today patient actually appears to be doing quite well in general in regard to his legs currently. Fortunately there is no signs of active infection at this time. No fevers, chills, nausea, vomiting, or diarrhea. He tells me next week that he will actually be seen in the lymphedema clinic on Thursday at 10 AM I see him on Wednesday next week. 04/12/2020 upon evaluation today patient appears to be doing very well with regard to his lower extremities bilaterally. In fact he does not appear to have any open wounds at this point which is good news. Fortunately there is no signs of active infection at this time. No fevers, chills, nausea, vomiting, or diarrhea. 04/19/2020 upon evaluation today patient appears to be doing well with regard to his wounds currently on the bilateral lower extremities. There does not appear to be any signs of active infection at this time. Fortunately there is no evidence of systemic infection and overall very pleased at this point. Nonetheless after I held him out last week he literally had blisters the next morning already which swelled up with him being right back here in the clinic. Overall I think that he is just not can be able to be discharged with his legs the way they are he is much to volume overloaded as far as fluid is concerned and that was discussed with him today of also discussed this but should try the clinic nurse manager as well as Dr.  Leanord Hawking. 04/26/2020 upon evaluation today patient appears to be doing better with regard to his wounds currently. He is making some progress and overall swelling is under good control with the compression wraps. Fortunately there is no evidence of active infection at this time. Electronic Signature(s) Signed: 04/26/2020 11:54:14 AM By: Lenda Kelp PA-C Entered By: Lenda Kelp on 04/26/2020 11:54:14 -------------------------------------------------------------------------------- Physical Exam Details Patient Name: Date of Service: [facility], Leemon J. 04/26/2020 10:30 A M Medical Record Number: [id number] Patient Account Number: [id number] Date of Birth/Sex: Treating RN: 1951/02/10 (69 y.o. [name] Primary Care Provider: Marney Setting, [name] Other Clinician: Referring Provider: Treating Provider/Extender: Lenda Kelp [facility] [name], [name] in Treatment: 222 Constitutional Obese and well-hydrated in no acute distress. Respiratory normal breathing without difficulty. Psychiatric this patient is able to make decisions and demonstrates good insight into disease process. Alert and Oriented x 3. pleasant and cooperative. Notes Patient's wound bed currently showed signs of good granulation at all wound  locations there is no evidence of obvious active infection today we did perform a culture on Monday but that is not back as of yet. Nonetheless he looks much better from a wound standpoint today compared to Monday according to the intake nurse. Electronic Signature(s) Signed: 04/26/2020 11:54:35 AM By: Lenda Kelp PA-C Entered By: Lenda Kelp on 04/26/2020 11:54:34 -------------------------------------------------------------------------------- Physician Orders Details Patient Name: Date of Service: Kevin [name], Alic J. 04/26/2020 10:30 A M Medical Record Number: [id number] Patient Account Number: [id number] Date of Birth/Sex: Treating RN: 1951/02/11 (69 y.o. 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 Dressing Change Frequency Do not change entire dressing for one week. - both legs Skin Barriers/Peri-Wound Care Moisturizing lotion - both legs Wound Cleansing May shower with protection. Primary Wound Dressing Wound #172 Right T [place] Calcium Alginate with Silver Wound #174 Left,Dorsal Foot Calcium Alginate with Silver Wound #175 Right,Posterior Lower Leg Calcium Alginate with Silver Secondary Dressing Wound #172 Right T [place] Dry Gauze ABD pad - as needed Wound #175 Right,Posterior Lower Leg Dry Gauze ABD pad - as needed Wound #174 Left,Dorsal Foot Dry Gauze ABD pad - as needed Edema Control 4 layer compression: Left lower extremity Unna Boot to Right Lower Extremity Avoid standing for long periods of time Elevate legs to the level of the heart or above for 30 minutes daily and/or when sitting, a frequency of: - throughout the day Exercise regularly Electronic Signature(s) Signed: 04/26/2020 4:25:30 PM By: Zenaida Deed RN, BSN Signed: 04/28/2020 3:47:20 PM By: Lenda Kelp PA-C Entered By: Zenaida Deed on 04/26/2020  11:54:20 -------------------------------------------------------------------------------- Problem List Details Patient Name: Date of Service: [name], Jakhari J. 04/26/2020 10:30 A M Medical Record Number: [id number] Patient Account Number: [id number] Date of Birth/Sex: Treating RN: November 16, 1950 (69 y.o. [name] Primary Care Provider: Marney Setting, [name] Other Clinician: Referring Provider: Treating Provider/Extender: Suzy Bouchard, [name] in Treatment: 222 Active Problems ICD-10 Encounter Code Description Active Date MDM Diagnosis 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 skin 06/30/2018 No Yes L97.221 Non-pressure chronic ulcer of left calf limited to breakdown of skin 09/30/2016 No Yes 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 of 01/22/2016 No Yes bilateral lower extremity I89.0 Lymphedema, not elsewhere classified 01/22/2016 No Yes E11.622 Type 2 diabetes mellitus with other skin ulcer 01/22/2016 No Yes 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, unspecified 01/22/2016 No Yes L03.116 Cellulitis of left lower limb 04/01/2017 No Yes Inactive Problems ICD-10 Code Description Active Date Inactive Date 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 skin 06/30/2017 06/30/2017 L97.521 Non-pressure chronic ulcer of other 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 of skin 04/27/2018 04/27/2018 L03.115 Cellulitis of right lower limb 12/22/2017 12/22/2017 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 06/30/2018 06/30/2018 L97.511 Non-pressure chronic ulcer of other part of  right foot limited to breakdown of skin 06/30/2018 06/30/2018 Resolved Problems Electronic Signature(s) Signed: 04/26/2020 10:52:25 AM By: Lenda Kelp PA-C Signed: 04/26/2020 10:52:25 AM By: Lenda Kelp PA-C Entered By: Lenda Kelp on 04/26/2020  10:52:24 -------------------------------------------------------------------------------- Progress Note Details Patient Name: Date of Service: Kevin [name], Goldman J. 04/26/2020 10:30 A M Medical Record Number: [id number] Patient Account Number: [id number] Date of Birth/Sex: Treating RN: 1951/09/29 (69 y.o. [name] Primary Care Provider: Marney Setting, [name] Other Clinician: Referring Provider: Treating Provider/Extender: Lenda Kelp [facility] [name], [name] in Treatment: 222 Subjective Chief Complaint Information obtained from Patient patient is here for evaluation venous/lymphedema weeping History of Present Illness (HPI) Referred by PCP for consultation. Patient has long standing history of BLE venous stasis, no prior ulcerations. At beginning of month, developed cellulitis and weeping. Received IM Rocephin followed by Keflex and resolved. Wears compression stocking, appr 6 months old. Not sure strength. No present drainage. 01/22/16 this is a patient who is a type II diabetic on insulin. 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 He tells me he religiously wears pressure stockings of uncertain strength. He was here with weeping edema about 8 months ago but did not have an open wound. Roughly a month ago he had a reopening on his bilateral legs. He is been using bandages and Neosporin. He does not complain of pain. 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 Last BUN/creatinine I have is from 11/20/15 at 13 and 1.0 respectively 01/29/16; patient arrives today having tolerated the Profore wrap. He brought in his stockings and these are 18 mmHg stockings he bought from [employer]. The compression here is likely inadequate. He does not complain of pain or excessive drainage she has no systemic symptoms. The wound on the right looks improved as does the one  on the left although one on the left is more substantial with still tissue at risk below the actual wound area on the bilateral posterior calf 02/05/16; patient arrives with poor edema control. He states that we did put a 4 layer compression on it last week. No weight appear 5 this. 02/12/16; the area on the posterior right Has healed. The left Has a substantial wound that has necrotic surface eschar that requires a debridement with a curette. 02/16/16;the patient called or a Nurse visit secondary to increased swelling. He had been in earlier in the week with his right leg healed. He was transitioned to is on pressure stocking on the right leg with the only open wound on the left, a substantial area on the left posterior calf. Note he has a history of severe lower extremity edema, he has a history of chronic atrial fibrillation but not heart failure per my notes but I'll need to research this. He is not complaining of chest pain shortness of breath or orthopnea. The intake nurse noted blisters on the previously closed right leg 02/19/16; this is the patient's regular visit day. I see him on Friday with escalating edema new wounds on the right leg and clear signs of at least right ventricular heart failure. I increased his Lasix to 40 twice a day. He is returning currently in follow-up. States he is noticed a decrease in that the edema 02/26/16 patient's legs have much less edema. There is nothing really open on the right leg. The left leg has improved condition of the  large superficial wound on the posterior left leg 03/04/16; edema control is very much better. The patient's right leg wounds have healed. On the left leg he continues to have severe venous inflammation on the posterior aspect of the left leg. There is no tenderness and I don't think any of this is cellulitis. 03/11/16; patient's right leg is married healed and he is in his own stocking. The patient's left leg has deteriorated somewhat. There is a  lot of erythema around the wound on the posterior left leg. There is also a significant rim of erythema posteriorly just above where the wrap would've ended there is a new wound in this location and a lot of tenderness. Can't rule out cellulitis in this area. 03/15/16; patient's right leg remains healed and he is in his own stocking. The patient's left leg is much better than last review. His major wound on the posterior aspect of his left Is almost fully epithelialized. He has 3 small injuries from the wraps. Really. Erythema seems a lot better on antibiotics 03/18/16; right leg remains healed and he is in his own stocking. The patient's left leg is much better. The area on the posterior aspect of the left calf is fully epithelialized. His 3 small injuries which were wrap injuries on the left are improved only one seems still open his erythema has resolved 03/25/16; patient's right leg remains healed and he is in his own stocking. There is no open area today on the left leg posterior leg is completely closed up. His wrap injuries at the superior aspect of his leg are also resolved. He looks as though he has some irritation on the dorsal ankle but this is fully epithelialized without evidence of infection. 03/28/16; we discharged this patient on Monday. Transitioned him into his own stocking. There were problems almost immediately with uncontrolled swelling weeping edema multiple some of which have opened. He does not feel systemically unwell in particular no chest pain no shortness of breath and he does not feel 04/08/16; the edema is under better control with the Profore light wrap but he still has pitting edema. There is one large wound anteriorly 2 on the medial aspect of his left leg and 3 small areas on the superior posterior calf. Drainage is not excessive he is tolerating a Profore light well 04/15/16; put a Profore wrap on him last week. This is controlled is edema however he had a lot of pain on  his left anterior foot most of his wounds are healed 04/22/16 once again the patient has denuded areas on the left anterior foot which he states are because his wrap slips up word. He saw his primary physician today is on Lasix 40 twice a day and states that he his weight is down 20 pounds over the last 3 months. 04/29/16: Much improved. left anterior foot much improved. He is now on Lasix 80 mg per day. Much improved edema control 05/06/16; I was hoping to be able to discharge him today however once again he has blisters at a low level of where the compression was placed last week mostly on his left lateral but also his left medial leg and a small area on the anterior part of the left foot. 05/09/16; apparently the patient went home after his appointment on 7/4 later in the evening developing pain in his upper medial thigh together with subjective fever and chills although his temperature was not taken. The pain was so intense he felt he would probably have  to call 911. However he then remembered that he had leftover doxycycline from a previous round of antibiotics and took these. By the next morning he felt a lot better. He called and spoke to one of our nurses and I approved doxycycline over the phone thinking that this was in relation to the wounds we had previously seen although they were definitely were not. The patient feels a lot better old fever no chills he is still working. Blood sugars are reasonably controlled 05/13/16; patient is back in for review of his cellulitis on his anterior medial upper thigh. He is taking doxycycline this is a lot better. Culture I did of the nodular area on the dorsal aspect of his foot grew MRSA this also looks a lot better. 05/20/16; the patient is cellulitis on the medial upper thigh has resolved. All of his wound areas including the left anterior foot, areas on the medial aspect of the left calf and the lateral aspect of the calf at all resolved. He has a new  blister on the left dorsal foot at the level of the fourth toe this was excised. No evidence of infection 05/27/16; patient continues to complain weeping edema. He has new blisterlike wounds on the left anterior lateral and posterior lateral calf at the top of his wrap levels. The area on his left anterior foot appears better. He is not complaining of fever, pain or pruritus in his feet. 05/30/16; the patient's blisters on his left anterior leg posterior calf all look improved. He did not increase the Lasix 100 mg as I suggested because he was going to run out of his 40 mg tablets. He is still having weeping edema of his toes 06/03/16; I renewed his Lasix at 80 mg once a day as he was about to run out when I last saw him. He is on 80 mg of Lasix now. I have asked him to cut down on the excessive amount of water he was drinking and asked him to drink according to his thirst mechanisms 06/12/2016 -- was seen 2 days ago and was supposed to wear his compression stockings at home but he is developed lymphedema and superficial blisters on the left lower extremity and hence came in for a review 06/24/16; the remaining wound is on his left anterior leg. He still has edema coming from between his toes. There is lymphedema here however his edema is generally better than when I last saw this. He has a history of atrial fibrillation but does not have a known history of congestive heart failure nevertheless I think he probably has this at least on a diastolic basis. 07/01/16 I reviewed his echocardiogram from January 2017. This was essentially normal. He did not have LVH, EF of 55-60%. His right ventricular function was normal although he did have trivial tricuspid and pulmonic regurgitation. This is not audible on exam however. I increased his Lasix to do massive edema in his legs well above his knees I think in early July. He was also drinking an excessive amount of water at the time. 07/15/16; missed his appointment  last week because of the Labor Day holiday on Monday. He could not get another appointment later in the week. Started to feel the wrap digging in superiorly so we remove the top half and the bottom half of his wrap. He has extensive erythema and blistering superiorly in the left leg. Very tender. Very swollen. Edema in his foot with leaking edema fluid. He has not been systemically  unwell 07/22/16; the area on the left leg laterally required some debridement. The medial wounds look more stable. His wrap injury wounds appear to have healed. Edema and his foot is better, weeping edema is also better. He tells me he is meeting with the supplier of the external compression pumps at work 08/05/16; the patient was on vacation last week in [facility]. His wrap is been on for an extended period of time. Also over the weekend he developed an extensive area of tender erythema across his anterior medial thigh. He took to doxycycline yesterday that he had leftover from a previous prescription. The patient complains of weeping edema coming out of his toes 08/08/16; I saw this patient on 10/2. He was tender across his anterior thigh. I put him on doxycycline. He returns today in follow-up. He does not have any open wounds on his lower leg, he still has edema weeping into his toes. 08/12/16; patient was seen back urgently today to follow-up for his extensive left thigh cellulitis/erysipelas. He comes back with a lot less swelling and erythema pain is much better. I believe I gave him Augmentin and Cipro. His wrap was cut down as he stated a roll down his legs. He developed blistering above the level of the wrap that remained. He has 2 open blisters and 1 intact. 08/19/16; patient is been doing his primary doctor who is increased his Lasix from 40-80 once a day or 80 already has less edema. Cellulitis has remained improved in the left thigh. 2 open areas on the posterior left calf 08/26/16; he returns today having new  open blisters on the anterior part of his left leg. He has his compression pumps but is not yet been shown how to use some vital representative from the supplier. 09/02/16 patient returns today with no open wounds on the left leg. Some maceration in his plantar toes 09/10/2016 -- Dr. Dellia Nims had recently discharged him on 09/02/2016 and he has come right back with redness swelling and some open ulcers on his left lower extremity. He says this was caused by trying to apply his compression stockings and he's been unable to use this and has not been able to use his lymphedema pumps. He had some doxycycline leftover and he has started on this a few days ago. 09/16/16; there are no open wounds on his leg on the left and no evidence of cellulitis. 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 He does not complain of symptoms here. I am not clear use using his external compression pumps. 09/23/16; I have not seen this patient in 2 weeks. He canceled his appointment 10 days ago as he was going on vacation. He tells me that on Monday he noticed a large area on his posterior left leg which is been draining copiously and is reopened into a large wound. He is been using ABDs and the external part of his juxtalite, according to our nurse this was not on properly. 10/07/16; Still a substantial area on the posterior left leg. Using silver alginate 10/14/16; in general better although there is still open area which looks healthy. Still using silver alginate. He reminds me that this happen before he left for [facility]. T oday while he was showering in the morning. He had been using his juxtalite's 10/21/16; the area on his posterior left leg is fully epithelialized. However he arrives today with a large area of tender erythema in his medial and  posterior left thigh just above the knee. I have marked the area. Once again he is reluctant to consider hospitalization.  I treated him with oral antibiotics in the past for a similar situation with resolution I think with doxycycline however this area it seems more extensive to me. He is not complaining of fever but does have chills and says states he is thirsty. His blood sugar today was in the 140s at home 10/25/16 the area on his posterior left leg is fully epithelialized although there is still some weeping edema. The large area of tenderness and erythema in his medial and posterior left thigh is a lot less tender although there is still a lot of swelling in this thigh. He states he feels a lot better. He is on doxycycline and Augmentin that I started last week. This will continued until Tuesday, December 26. I have ordered a duplex ultrasound of the left thigh rule out DVT whether there is an abscess something that would need to be drained I would also like to know. 11/01/16; he still has weeping edema from a not fully epithelialized area on his left posterior calf. Most of the rest of this looks a lot better. He has completed his antibiotics. His thigh is a lot better. Duplex ultrasound did not show a DVT in the thigh 11/08/16; he comes in today with more Denuded surface epithelium from the posterior aspect of his calf. There is no real evidence of cellulitis. The superior aspect of his wrap appears to have put quite an indentation in his leg just below the knee and this may have contributed. He does not complain of pain or fever. We have been using silver alginate as the primary dressing. The area of cellulitis in the right thigh has totally resolved. He has been using his compression stockings once a week 11/15/16; the patient arrives today with more loss of epithelium from the posterior aspect of his left calf. He now has a fairly substantial wound in this area. The reason behind this deterioration isn't exactly clear although his edema is not well controlled. He states he feels he is generally more swollen  systemically. He is not complaining of chest pain shortness of breath fever. T me he has an appointment with his primary physician in early February. He is on 80 mg of oral ells Lasix a day. He claims compliance with the external compression pumps. He is not having any pain in his legs similar to what he has with his recurrent cellulitis 11/22/16; the patient arrives a follow-up of his large area on his left lateral calf. This looks somewhat better today. He came in earlier in the week for a dressing change since I saw him a week ago. He is not complaining of any pain no shortness of breath no chest pain 11/28/16; the patient arrives for follow-up of his large area on the left lateral calf this does not look better. In fact it is larger weeping edema. The surface of the wound does not look too bad. We have been using silver alginate although I'm not certain that this is a dressing issue. 12/05/16; again the patient follows up for a large wound on the left lateral and left posterior calf this does not look better. There continues to be weeping edema necrotic surface tissue. More worrisome than this once again there is erythema below the wound involving the distal Achilles and heel suggestive of cellulitis. He is on his feet working most of the day of this  is not going well. We are changing his dressing twice a week to facilitate the drainage. 12/12/16; not much change in the overall dimensions of the large area on the left posterior calf. This is very inflamed looking. I gave him an. Doxycycline last week does not really seem to have helped. He found the wrap very painful indeed it seems to of dog into his legs superiorly and perhaps around the heel. He came in early today because the drainage had soaked through his dressings. 12/19/16- patient arrives for follow-up evaluation of his left lower extremity ulcers. He states that he is using his lymphedema pumps once daily when there is "no drainage". He admits  to not using his lipedema pumps while under current treatment. His blood sugars have been consistently between 150-200. 12/26/16; the patient is not using his compression pumps at home because of the wetness on his feet. I've advised him that I think it's important for him to use this daily. He finds his feet too wet, he can put a plastic bag over his legs while he is in the pumps. Otherwise I think will be in a vicious circle. We are using silver alginate to the major area on his left posterior calf 01/02/17; the patient's posterior left leg has further of all into 3 open wounds. All of them covered with a necrotic surface. He claims to be using his compression pumps once a day. His edema control is marginal. Continue with silver alginate 01/10/17; the patient's left posterior leg actually looks somewhat better. There is less edema, less erythema. Still has 3 open areas covered with a necrotic surface requiring debridement. He claims to be using his compression pumps once a day his edema control is better 01/17/17; the patient's left posterior calf look better last week when I saw him and his wrap was changed 2 days ago. He has noted increasing pain in the left heel and arrives today with much larger wounds extensive erythema extending down into the entire heel area especially tender medially. He is not systemically unwell CBGs have been controlled no fever. Our intake nurse showed me limegreen drainage on his AVD pads. 01/24/17; his usual this patient responds nicely to antibiotics last week giving him Levaquin for presumed Pseudomonas. The whole entire posterior part of his leg is much better much less inflamed and in the case of his Achilles heel area much less tender. He has also had some epithelialization posteriorly there are still open areas here and still draining but overall considerably better 01/31/17- He has continue to tolerate the compression wraps. he states that he continues to use the  lymphedema pumps daily, and can increase to twice daily on the weekends. He is voicing no complaints or concerns regarding his LLE ulcers 02/07/17-he is here for follow-up evaluation. He states that he noted some erythema to the left medial and anterior thigh, which he states is new as of yesterday. He is concerned about recurrent cellulitis. He states his blood sugars have been slightly elevated, this morning in the 180s 02/14/17; he is here for follow-up evaluation. When he was last here there was erythema superiorly from his posterior wound in his anterior thigh. He was prescribed Levaquin however a culture of the wound surface grew MRSA over the phone I changed him to doxycycline on Monday and things seem to be a lot better. 02/24/17; patient missed his appointment on Friday therefore we changed his nurse visit into a physician visit today. Still using silver alginate on the  large area of the posterior left thigh. He isn't new area on the dorsal left second toe 03/03/17; actually better today although he admits he has not used his external compression pumps in the last 2 days or so because of work responsibilities over the weekend. 03/10/17; continued improvement. External compression pumps once a day almost all of his wounds have closed on the posterior left calf. Better edema control 03/17/17; in general improved. He still has 3 small open areas on the lateral aspect of his left leg however most of the area on the posterior part of his leg is epithelialized. He has better edema control. He has an ABD pad under his stocking on the right anterior lower leg although he did not let us look at that today. 03/24/17; patient arrives back in clinic today with no open areas however there are areas on the posterior left calf and anterior left calf that are less than 100% epithelialized. His edema is well controlled in the left lower leg. There is some pitting edema probably lymphedema in the left upper thigh. He  uses compression pumps at home once per day. I tried to get him to do this twice a day although he is very reticent. 04/01/2017 -- for the last 2 days he's had significant redness, tenderness and weeping and came in for an urgent visit today. 04/07/17; patient still has 6 more days of doxycycline. He was seen by Dr. Meyer Russel last Wednesday for cellulitis involving the posterior aspect, lateral aspect of his Involving his heel. For the most part he is better there is less erythema and less weeping. He has been on his feet for 12 hours o2 over the weekend. Using his compression pumps once a day 04/14/17 arrives today with continued improvement. Only one area on the posterior left calf that is not fully epithelialized. He has intense bilateral venous inflammation associated with his chronic venous insufficiency disease and secondary lymphedema. We have been using silver alginate to the left posterior calf wound In passing he tells [place] today that the right leg but we have not seen in quite some time has an open area on it but he doesn't want [place] to look at this today states he will show this to [place] next week. 04/21/17; there is no open area on his left leg although he still reports some weeping edema. He showed [place] his right leg today which is the first time we've seen this leg in a long time. He has a large area of open wound on the right leg anteriorly healthy granulation. Quite a bit of swelling in the right leg and some degree of venous inflammation. He told us about the right leg in passing last week but states that deterioration in the right leg really only happened over the weekend 04/28/17; there is no open area on the left leg although there is an irritated part on the posterior which is like a wrap injury. The wound on the right leg which was new from last week at least to [place] is a lot better. 05/05/17; still no open area on the left leg. Patient is using his new compression stocking which seems to be doing  a good job of controlling the edema. He states he is using his compression pumps once per day. The right leg still has an open wound although it is better in terms of surface area. Required debridement. A lot of pain in the posterior right Achilles marked tenderness. Usually this type of presentation this  patient gives concern for an active cellulitis 05/12/17; patient arrives today with his major wound from last week on the right lateral leg somewhat better. Still requiring debridement. He was using his compression stocking on the left leg however that is reopened with superficial wounds anteriorly he did not have an open wound on this leg previously. He is still using his juxta light's once daily at night. He cannot find the time to do this in the morning as he has to be at work by 7 AM 05/19/17; right lateral leg wound looks improved. No debridement required. The concerning area is on the left posterior leg which appears to almost have a subcutaneous hemorrhagic component to it. We've been using silver alginate to all the wounds 05/26/17; the right lateral leg wound continues to look improved. However the area on the left posterior calf is a tightly adherent surface. [place] using silver alginate. Because of the weeping edema in his legs there is very little good alternatives. 06/02/17; the patient left here last week looking quite good. Major wound on the left posterior calf and a small one on the right lateral calf. Both of these look satisfactory. He tells me that by Wednesday he had noted increased pain in the left leg and drainage. He called on Thursday and Friday to get an appointment here but we were blocked. He did not go to urgent care or his primary physician. He thinks he had a fever on Thursday but did not actually take his temperature. He has not been using his compression pumps on the left leg because of pain. I advised him to go to the emergency room today for IV antibiotics for stents of  left leg cellulitis but he has refused I have asked him to take 2 days off work to keep his leg elevated and he has refused this as well. In view of this I'm going to call him and Augmentin and doxycycline. 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 However after a detailed discussion regarding the risk benefits and alternatives he refuses to get admitted to the hospital. With no other recourse I will continue him on oral antibiotics as before and hopefully he'll have his infectious disease consultation this week. 06/16/2017 -- the patient was seen today by the nurse practitioner at infectious disease Ms. Dixon. 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 He has also been advise Lotrimin cream locally. she also had wise IV antibiotics for his condition if it flares up 06/23/17; patient arrives today with drainage bilaterally although the remaining wound on the left posterior calf after cleaning up today "highlighter yellow drainage" did not look too bad. Unfortunately he has had breakdown on the right anterior leg [previously this leg had not been open and he is using a black stocking] he went to see infectious disease and is been put on clindamycin 150 mg daily, I did not verify the dose although I'm not familiar with using clindamycin in this dosing range, perhaps for prophylaxisoo 06/27/17; I brought this patient back today to follow-up on the wound  deterioration on the right lower leg together with surrounding cellulitis. I started him on doxycycline 4 days ago. This area looks  better however he comes in today with intense cellulitis on the medial part of his left thigh. This is not have a wound in this area. Extremely tender. We've been using silver alginate to the wounds on the right lower leg left lower leg with bilateral 4 layer compression he is using his external compression pumps once a day 07/04/17; patient's left medial thigh cellulitis looks better. He has not been using his compression pumps as his insert said it was contraindicated with cellulitis. His right leg continues to make improvements all the wounds are still open. We only have one remaining wound on the left posterior calf. Using silver alginate to all open areas. He is on doxycycline which I started a week ago and should be finishing I gave him Augmentin after Thursday's visit for the severe cellulitis on the left medial thigh which fortunately looks better 07/14/17; the patient's left medial thigh cellulitis has resolved. The cellulitis in his right lower calf on the right also looks better. All of his wounds are stable to improved we've been using silver alginate he has completed the antibiotics I have given him. He has clindamycin 150 mg once a day prescribed by infectious disease for prophylaxis, I've advised him to start this now. We have been using bilateral Unna boots over silver alginate to the wound areas 07/21/17; the patient is been to see infectious disease who noted his recurrent problems with cellulitis. He was not able to tolerate prophylactic clindamycin therefore he is on amoxicillin 500 twice a day. He also had a second daily dose of Lasix added By Dr. Oneta Rack but he is not taking this. Nor is he being completely compliant with his compression pumps a especially not this week. He has 2 remaining wounds one on the right posterior lateral lower leg and one on the left posterior medial lower leg. 07/28/17; maintain on Amoxil 500 twice a day as prophylaxis for recurrent cellulitis as ordered by  infectious disease. The patient has Unna boots bilaterally. 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 He has bilateral Unna boots for compression and silver alginate to his wounds. Arrives today with his legs looking as good as I have seen him in quite some time. Not surprisingly his wounds look better as well with improvement on the right lateral leg venous insufficiency wound and also the left medial leg. He is still using the compression pumps once a day 08/11/17; both legs appear to be doing better wounds on the right lateral and left medial legs look better. Skin on the right leg quite good. He is been using silver alginate as the primary dressing. I'm going to use Anasept gel calcium alginate and maintain all the secondary dressings 08/18/17; the patient continues to actually do quite well. The area on his right lateral leg is just about closed the left medial also looks better although it is still moist in this area. His edema is well controlled we have been using Anasept gel with calcium alginate and the usual secondary dressings, 4 layer compression and once daily use of his compression pumps "always been able to manage 09/01/17; the patient continues to do reasonably well in spite of his trip to [place]. The area on the  right lateral leg is epithelialized. Left is much better but still open. He has more edema and more chronic erythema on the left leg [venous inflammation] 09/08/17; he arrives today with no open wound on the right lateral leg and decently controlled edema. Unfortunately his left leg is not nearly as in his good situation as last week.he apparently had increasing edema starting on Saturday. He edema soaked through into his foot so used a plastic bag to walk around his home. The area on the medial right leg which was his open  area is about the same however he has lost surface epithelium on the left lateral which is new and he has significant pain in the Achilles area of the left foot. He is already on amoxicillin chronically for prophylaxis of cellulitis in the left leg 09/15/17; he is completed a week of doxycycline and the cellulitis in the left posterior leg and Achilles area is as usual improved. He still has a lot of edema and fluid soaking through his dressings. There is no open wound on the right leg. He saw infectious disease NP today 09/22/17;As usual 1 we transition him from our compression wraps to his stockings things did not go well. He has several small open areas on the right leg. He states this was caused by the compression wrap on his skin although he did not wear this with the stockings over them. He has several superficial areas on the left leg medially laterally posteriorly. He does not have any evidence of active cellulitis especially involving the left Achilles The patient is traveling from [facility] Saturday going to [facility]. He states he isn't attempting to get an appointment with a heel objects wound center there to change his dressings. I am not completely certain whether this will work 10/06/17; the patient came in on Friday for a nurse visit and the nurse reported that his legs actually look quite good. He arrives in clinic today for his regular follow-up visit. He has a new wound on his left third toe over the PIP probably caused by friction with his footwear. He has small areas on the left leg and a very superficial but epithelialized area on the right anterior lateral lower leg. Other than that his legs look as good as I've seen him in quite some time. We have been using silver alginate Review of systems; no chest pain no shortness of breath other than this a 10 point review of systems negative 10/20/17; seen by Dr. Meyer Russel last week. He had taken some antibiotics [doxycycline] that he had  left over. Dr. Meyer Russel thought he had candida infection and declined to give him further antibiotics. He has a small wound remaining on the right lateral leg several areas on the left leg including a larger area on the left posterior several left medial and anterior and a small wound on the left lateral. The area on the left dorsal third toe looks a lot better. ROS; Gen.; no fever, respiratory no cough no sputum Cardiac no chest pain other than this 10 point review of system is negative 10/30/17; patient arrives today having fallen in the bathtub 3 days ago. It took him a while to get up. He has pain and maceration in the wounds on his left leg which have deteriorated. He has not been using his pumps he also has some maceration on the right lateral leg. 11/03/17; patient continues to have weeping edema especially in the left leg. This saturates his dressings which were just  put on on 12/27. As usual the doxycycline seems to take care of the cellulitis on his lower leg. He is not complaining of fever, chills, or other systemic symptoms. He states his leg feels a lot better on the doxycycline I gave him empirically. He also apparently gets injections at his primary doctor's officeo Rocephin for cellulitis prophylaxis. I didn't ask him about his compression pump compliance today I think that's probably marginal. Arrives in the clinic with all of his dressings primary and secondary macerated full of fluid and he has bilateral edema 11/10/17; the patient's right leg looks some better although there is still a cluster of wounds on the right lateral. The left leg is inflamed with almost circumferential skin loss medially to laterally although we are still maintaining anteriorly. He does not have overt cellulitis there is a lot of drainage. He is not using compression pumps. We have been using silver alginate to the wound areas, there are not a lot of options here 11/17/17; the patient's right leg continues to be  stable although there is still open wounds, better than last week. The inflammation in the left leg is better. Still loss of surface layer epithelium especially posteriorly. There is no overt cellulitis in the amount of edema and his left leg is really quite good, tells me he is using his compression pumps once a day. 11/24/17; patient's right leg has a small superficial wound laterally this continues to improve. The inflammation in the left leg is still improving however we have continuous surface layer epithelial loss posteriorly. There is no overt cellulitis in the amount of edema in both legs is really quite good. He states he is using his compression pumps on the left leg once a day for 5 out of 7 days 12/01/17; very small superficial areas on the right lateral leg continue to improve. Edema control in both legs is better today. He has continued loss of surface epithelialization and left posterior calf although I think this is better. We have been using silver alginate with large number of absorptive secondary dressings 4 layer on the left Unna boot on the right at his request. He tells me he is using his compression pumps once a day 12/08/17; he has no open area on the right leg is edema control is good here. ooOn the left leg however he has marked erythema and tenderness breakdown of skin. He has what appears to be a wrap injury just distal to the popliteal fossa. 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 There was swelling erythema and breakdown in the right leg. He has much worse skin breakdown in the left leg as well multiple open areas medially and posteriorly as well as laterally. He tells me he has been using  his compression pumps but tells me he feels that the drainage out of his leg is worse when he uses a compression pumps. T be fair to him he is been saying this o for a while however I don't know that I have really been listening to this. I wonder if the compression pumps are working properly 12/22/17;. Once again he arrives with severe erythema, weeping edema from the left greater than right leg. Noncompliance with compression pumps. New  this visit he is complaining of pain on the lateral aspect of the right leg and the medial aspect of his right thigh. He apparently saw his cardiologist Dr. [name] who was ordered an echocardiogram area and I think this is a step in the right direction 12/25/17; started his doxycycline Monday night. There is still intense erythema of the right leg especially in the anterior thigh although there is less tenderness. The erythema around the wound on the right lateral calf also is less tender. He still complaining of pain in the left heel. His wounds are about the same right lateral left medial left lateral. Superficial but certainly not close to closure. He denies being systemically unwell no fever chills no abdominal pain no diarrhea 12/29/17; back in follow-up of his extensive right calf and right thigh cellulitis. I added amoxicillin to cover possible doxycycline resistant strep. This seems to of done the trick he is in much less pain there is much less erythema and swelling. He has his echocardiogram at 11:00 this morning. X-ray of the left heel was also negative. 01/05/18; the patient arrived with his edema under much better control. Now that he is retired he is able to use his compression pumps daily and sometimes twice a day per the patient. He has a wound on the right leg the lateral wound looks better. Area on the left leg also looks a lot better. He has no evidence of cellulitis in his bilateral thighs I had a quick peak at his echocardiogram. He is in normal  ejection fraction and normal left ventricular function. He has moderate pulmonary hypertension moderately reduced right ventricular function. One would have to wonder about chronic sleep apnea although he says he doesn't snore. He'll review the echocardiogram with his cardiologist. 01/12/18; the patient arrives with the edema in both legs under exemplary control. He is using his compression pumps daily and sometimes twice daily. His wound on the right lateral leg is just about closed. He still has some weeping areas on the posterior left calf and lateral left calf although everything is just about closed here as well. I have spoken with Aldean Baker who is the patient's nurse practitioner and infectious disease. She was concerned that the patient had not understood that the parenteral penicillin injections he was receiving for cellulitis prophylaxis was actually benefiting him. I don't think the patient actually saw that I would tend to agree we were certainly dealing with less infections although he had a serious one last month. 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 He is healed. He'll be placed in juxtalite compression wraps and increase his lymphedema pumps to twice daily. We will follow up again next week to ensure there are no issues with the new regiment. 01/20/18-he is here for evaluation of bilateral lower extremity weeping edema. Yesterday he was placed in compression wrap to the right lower extremity and compression stocking to left lower shrubbery. He states he uses lymphedema pumps last night and again this morning and noted a blister to the left lower extremity. On exam he was noted to have drainage to the right lower extremity. He will be placed in Unna boots bilaterally and follow-up next week 01/26/18; patient was actually discharged a week ago to his own juxta light stockings only to return the next day with bilateral lower extremity weeping  edema.he was placed in bilateral Unna boots. He arrives today with pain in the back of his left leg. There is no  open area on the right leg however there is a linear/wrap injury on the left leg and weeping edema on the left leg posteriorly. I spoke with infectious disease about 10 days ago. They were disappointed that the patient elected to discontinue prophylactic intramuscular penicillin shots as they felt it was particularly beneficial in reducing the frequency of his cellulitis. I discussed this with the patient today. He does not share this view. He'll definitely need antibiotics today. Finally he is traveling to [place] and trauma leaving this Saturday and returning a week later and he does not travel with his pumps. He is going by car 01/30/18; patient was seen 4 days ago and brought back in today for review of cellulitis in the left leg posteriorly. I put him on amoxicillin this really hasn't helped as much as I might like. He is also worried because he is traveling to [facility] by car. Finally we will be rewrapping him. There is no open area on the right leg over his left leg has multiple weeping areas as usual 02/09/18; The same wrap on for 10 days. He did not pick up the last doxycycline I prescribed for him. He apparently took 4 days worth he already had. There is nothing open on his right leg and the edema control is really quite good. He's had damage in the left leg medially and laterally especially probably related to the prolonged use of Unna boots 02/12/18; the patient arrived in clinic today for a nurse visit/wrap change. He complained of a lot of pain in the left posterior calf. He is taking doxycycline that I previously prescribed for him. Unfortunately even though he used his stockings and apparently used to compression pumps twice a day he has weeping edema coming out of the lateral part of his right leg. This is coming from the lower anterior lateral skin area. 02/16/18; the  patient has finished his doxycycline and will finish the amoxicillin 2 days. The area of cellulitis in the left calf posteriorly has resolved. He is no longer having any pain. He tells me he is using his compression pumps at least once a day sometimes twice. 02/23/18; the patient finished his doxycycline and Amoxil last week. On Friday he noticed a small erythematous circle about the size of a quarter on the left lower leg just above his ankle. This rapidly expanded and he now has erythema on the lateral and posterior part of the thigh. This is bright red. Also has an area on the dorsal foot just above his toes and a tender area just below the left popliteal fossa. He came off his prophylactic penicillin injections at his own insistence one or 2 months ago. This is obviously deteriorated since then 03/02/18; patient is on doxycycline and Amoxil. Culture I did last week of the weeping area on the back of his left calf grew group B strep. I have therefore renewed the amoxicillin 500 3 times a day for a further week. He has not been systemically unwell. Still complaining of an area of discomfort right under his left popliteal fossa. There is no open wound on the right leg. He tells me that he is using his pumps twice a day on most days 03/09/18; patient arrives in clinic today completing his amoxicillin today. The cellulitis on his left leg is better. Furthermore he tells me that he had intramuscular penicillin shots that his primary care office today. However he also states that the wrap on his right leg fell down shortly  after leaving clinic last week. He developed a large blister that was present when he came in for a nurse visit later in the week and then he developed intense discomfort around this area.He tells me he is using his compression pumps 03/16/18; the patient has completed his doxycycline. The infectious part of this/cellulitis in the left heel area left popliteal area is a lot better. He has 2  open areas on the right calf. Still areas on the left calf but this is a lot better as well. 03/24/18; the patient arrives complaining of pain in the left popliteal area again. He thinks some of this is wrap injury. He has no open area on the right leg and really no open area on the left calf either except for the popliteal area. He claims to be compliant with the compression pumps 03/31/18; I gave him doxycycline last week because of cellulitis in the left popliteal area. This is a lot better although the surface epithelium is denuded off and response to this. He arrives today with uncontrolled edema in the right calf area as well as a fingernail injury in the right lateral calf. There is only a few open areas on the left 04/06/18; I gave him amoxicillin doxycycline over the last 2 weeks that the amoxicillin should be completing currently. He is not complaining of any pain or systemic symptoms. The only open areas see has is on the right lateral lower leg paradoxically I cannot see anything on the left lower leg. He tells me he is using his compression pumps twice a day on most days. Silver alginate to the wounds that are open under 4 layer compression 04/13/18; he completed antibiotics and has no new complaints. Using his compression pumps. Silver alginate that anything that's opened 04/20/18; he is using his compression pumps religiously. Silver alginate 4 layer compression anything that's opened. He comes in today with no open wounds on the left leg but 3 on the right including a new one posteriorly. He has 2 on the right lateral and one on the right posterior. He likes Unna boots on the right leg for reasons that aren't really clear we had the usual 4 layer compression on the left. It may be necessary to move to the 4 layer compression on the right however for now I left them in the Unna boots 04/27/18; he is using his compression pumps at least once a day. He has still the wounds on the right lateral  calf. The area right posteriorly has closed. He does not have an open wound on the left under 4 layer compression however on the dorsal left foot just proximal to the toes and the left third toe 2 small open areas were identified 05/11/18; he has not uses compression pumps. The areas on the right lateral calf have coalesced into one large wound necrotic surface. On the left side he has one small wound anteriorly however the edema is now weeping out of a large part of his left leg. He says he wasn't using his pumps because of the weeping fluid. I explained to him that this is the time he needs to pump more 05/18/18; patient states he is using his compression pumps twice a day. The area on the right lateral large wound albeit superficial. On the left side he has innumerable number of small new wounds on the left calf particularly laterally but several anteriorly and medially. All these appear to have healthy granulated base these look like the remnants of  blisters however they occurred under compression. The patient arrives in clinic today with his legs somewhat better. There is certainly less edema, less multiple open areas on the left calf and the right anterior leg looks somewhat better as well superficial and a little smaller. However he relates pain and erythema over the last 3-4 days in the thigh and I looked at this today. He has not been systemically unwell no fever no chills no change in blood sugar values 05/25/18; comes in today in a better state. The severe cellulitis on his left leg seems better with the Keflex. Not as tender. He has not been systemically unwell ooHard to find an open wound on the left lower leg using his compression pumps twice a day ooThe confluent wounds on his right lateral calf somewhat better looking. These will ultimately need debridement I didn't do this today. 06/01/18; the severe cellulitis on the left anterior thigh has resolved and he is completed his  Keflex. ooThere is no open wound on the left leg however there is a superficial excoriation at the base of the third toe dorsally. Skin on the bottom of his left foot is macerated looking. ooThe left the wounds on the lateral right leg actually looks some better although he did require debridement of the top half of this wound area with an open curet 06/09/18 on evaluation today patient appears to be doing poorly in regard to his right lower extremity in particular this appears to likely be infected he has very thick purulent discharge along with a bright green tent to the discharge. This makes me concerned about the possibility of pseudomonas. He's also having increased discomfort at this point on evaluation. Fortunately there does not appear to be any evidence of infection spreading to the other location at this time. 06/16/18 on evaluation today patient appears to actually be doing fairly well. His ulcer has actually diminished in size quite significantly at this point which is good news. Nonetheless he still does have some evidence of infection he did see infectious disease this morning before coming here for his appointment. I did review the results of their evaluation and their note today. They did actually have him discontinue the Cipro and initiate treatment with linezolid at this time. He is doing this for the next seven days and they recommended a follow-up in four months with them. He is the keep a log of the need for intermittent antibiotic therapy between now and when he falls back up with infectious disease. This will help them gaze what exactly they need to do to try and help them out. 06/23/18; the patient arrives today with no open wounds on the left leg and left third toe healed. He is been using his compression pumps twice a day. On the right lateral leg he still has a sizable wound but this is a lot better than last time I saw this. In my absence he apparently cultured MRSA coming  from this wound and is completed a course of linezolid as has been directed by infectious disease. Has been using silver alginate under 4 layer compression 06/30/18; the only open wound he has is on the right lateral leg and this looks healthy. No debridement is required. We have been using silver alginate. He does not have an open wound on the left leg. There is apparently some drainage from the dorsal proximal third toe on the left although I see no open wound here. 07/03/18 on evaluation today patient was actually here  just for a nurse visit rapid change. However when he was here on Wednesday for his rat change due to having been healed on the left and then developing blisters we initiated the wrap again knowing that he would be back today for [place] to reevaluate and see were at. Unfortunately he has developed some cellulitis into the proximal portion of his right lower extremity even into the region of his thigh. 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 He was placed on one as what at that point. Nonetheless he is done with that and has been tolerating it well otherwise. Doxycycline which in the past really did not seem to be effective for him. Nonetheless I think the best option may be for [place] to definitely reinitiate the antibiotics for a longer period of time. 07/07/18; since I last saw this patient a week ago he has had a difficult time. At that point he did not have an open wound on his left leg. We transitioned him into juxta light stockings. He was apparently in the clinic the next day with blisters on the left lateral and left medial lower calf. He also had weeping edema fluid. He was put back into a compression wrap. He was also in the clinic on Friday with intense erythema in his right thigh. Per the patient he was started on Bactrim however that didn't work at all in terms of relieving his pain and swelling. He has taken 3 doxycycline that he had left  over from last time and that seems to of helped. He has blistering on the right thigh as well. 07/14/18; the erythema on his right thigh has gotten better with doxycycline that he is finishing. 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 Presumably this cellulitis in the thigh was not related to that although I think this is a bit concerning going forward. He still has an area on the right lateral calf the blister on the right medial calf just below the knee that was discussed above. On the left 2 small open areas left medial and left lateral. Edema control is adequate. He is using his compression pumps twice a day 07/20/18; continued improvement in the condition of both legs especially the edema in his bilateral thighs. He tells me he is been losing weight through a combination of diet and exercise. He is using his compression pumps twice a day. So overall she made to the remaining wounds 07/27/2018; continued improvement in condition of both legs. His edema is well controlled. The area on the right lateral leg is just about closed he had one blisters show up on the medial left upper calf. We have him in 4 layer compression. He is going on a 10-day trip to [place], [place] and [place]. He will be driving. He wants to wear Unna boots because of the lessening amount of constriction. He will not use compression pumps while he is away 08/05/18 on evaluation today patient actually appears to be doing decently well all things considered in regard to his bilateral lower extremities. The worst ulcer is actually only posterior aspect of his left lower extremity with a four layer compression wrap cut into his leg a couple weeks back. He did have a trip and actually had [employer] for the trip that he is worn since he was last here. Nonetheless he feels like  the [employer] wraps actually do better for him his swelling is up a  little bit but he also with his trip was not taking his Lasix on a regular set schedule like he was supposed to be. He states that obviously the reason being that he cannot drive and keep going without having to urinate too frequently which makes it difficult. He did not have his pumps with him while he was away either which I think also maybe playing a role here too. 08/13/2018; the patient only has a small open wound on the right lateral calf which is a big improvement in the last month or 2. He also has the area posteriorly just below the posterior fossa on the left which I think was a wrap injury from several weeks ago. He has no current evidence of cellulitis. He tells me he is back into his compression pumps twice a day. He also tells me that while he was at the laundromat somebody stole a section of his extremitease stockings 08/20/2018; back in the clinic with a much improved state. He only has small areas on the right lateral mid calf which is just about healed. This was is more substantial area for quite a prolonged period of time. He has a small open area on the left anterior tibia. The area on the posterior calf just below the popliteal fossa is closed today. He is using his compression pumps twice a day 08/28/2018; patient has no open wound on the right leg. He has a smattering of open areas on the calf with some weeping lymphedema. More problematically than that it looks as though his wraps of slipped down in his usual he has very angry upper area of edema just below the right medial knee and on the right lateral calf. He has no open area on his feet. The patient is traveling to [facility] next week. I will send him in an antibiotic. We will continue to wrap the right leg. We ordered extremitease stockings for him last week and I plan to transition the right leg to a stocking when he gets home which will be in 10 days time. As usual he is very reluctant to take his pumps with him when  he travels 09/07/2018; patient returns from [facility]. He shows me a picture of his left leg in the mid part of his trip last week with intense fire engine erythema. The picture look bad enough I would have considered sending him to the hospital. Instead he went to the wound care center in [facility]. They did not prescribe him antibiotics but he did take some doxycycline he had leftover from a previous visit. I had given him trimethoprim sulfamethoxazole before he left this did not work according to the patient. This is resulted in some improvement fortunately. He comes back with a large wound on the left posterior calf. Smaller area on the left anterior tibia. Denuded blisters on the dorsal left foot over his toes. Does not have much in the way of wounds on the right leg although he does have a very tender area on the right posterior area just below the popliteal fossa also suggestive of infection. He promises me he is back on his pumps twice a day 09/15/2018; the intense cellulitis in his left lower calf is a lot better. The wound area on the posterior left calf is also so better. However he has reasonably extensive wounds on the dorsal aspect of his second and third  toes and the proximal foot just at the base of the toes. There is nothing open on the right leg 09/22/2018; the patient has excellent edema control in his legs bilaterally. He is using his external compression pumps twice a day. He has no open area on the right leg and only the areas in the left foot dorsally second and third toe area on the left side. He does not have any signs of active cellulitis. 10/06/2018; the patient has good edema control bilaterally. He has no open wound on the right leg. There is a blister in the posterior aspect of his left calf that we had to deal with today. He is using his compression pumps twice a day. There is no signs of active cellulitis. We have been using silver alginate to the wound  areas. He still has vulnerable areas on the base of his left first second toes dorsally He has a his extremities stockings and we are going to transition him today into the stocking on the right leg. He is cautioned that he will need to continue to use the compression pumps twice a day. If he notices uncontrolled edema in the right leg he may need to go to 3 times a day. 10/13/2018; the patient came in for a nurse check on Friday he has a large flaccid blister on the right medial calf just below the knee. We unroofed this. He has this and a new area underneath the posterior mid calf which was undoubtedly a blister as well. He also has several small areas on the right which is the area we put his extremities stocking on. 10/19/2018; the patient went to see infectious disease this morning I am not sure if that was a routine follow-up in any case the doxycycline I had given him was discontinued and started on linezolid. He has not started this. It is easy to look at his left calf and the inflammation and think this is cellulitis however he is very tender in the tissue just below the popliteal fossa and I have no doubt that there is infection going on here. He states the problem he is having is that with the compression pumps the edema goes down and then starts walking the wrap falls down. We will see if we can adhere this. He has 1 or 2 minuscule open areas on the right still areas that are weeping on the posterior left calf, the base of his left second and third toes 10/26/18; back today in clinic with quite of skin breakdown in his left anterior leg. This may have been infection the area below the popliteal fossa seems a lot better however tremendous epithelial loss on the left anterior mid tibia area over quite inexpensive tissue. He has 2 blisters on the right side but no other open wound here. 10/29/2018; came in urgently to see [place] today and we worked him in for review. He states that the 4 layer  compression on the right leg caused pain he had to cut it down to roughly his mid calf this caused swelling above the wrap and he has blisters and skin breakdown today. As a result of the pain he has not been using his pumps. Both legs are a lot more edematous and there is a lot of weeping fluid. 11/02/18; arrives in clinic with continued difficulties in the right leg> left. Leg is swollen and painful. multiple skin blisters and new open areas especially laterally. He has not been using his pumps on the  right leg. 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 He is not systemically unwell. 11/09/2018; the patient claims he is being compliant with his pumps. He is finished the doxycycline I gave him last week. Culture I did of the wound on the right lateral leg showed a few very resistant methicillin staph aureus. This was resistant to doxycycline. Nevertheless he states the pain in the leg is a lot better which makes me wonder if the cultured organism was not really what was causing the problem nevertheless this is a very dangerous organism to be culturing out of any wound. His right leg is still a lot larger than the left. He is using an Radio broadcast assistant on this area, he blames a 4-layer compression for causing the original skin breakdown which I doubt is true however I cannot talk him out of it. We have been using silver alginate to all of these areas which were initially blisters 11/16/2018; patient is being compliant with his external compression pumps at twice a day. Miraculously he arrives in clinic today with absolutely no open wounds. He has better edema control on the left where he has been using 4 layer compression versus wound of wounds on the right and I pointed this out to him. There is no inflammation in the skin in his lower legs which is also somewhat unusual for him. There is no open wounds on the dorsal left foot. He has extremitease stockings at home  and I have asked him to bring these in next week. 11/25/18 patient's lower extremity on examination today on the left appears for the most part to be wound free. He does have an open wound on the lateral aspect of the right lower extremity but this is minimal compared to what I've seen in past. He does request that we go ahead and wrap the left leg as well even though there's nothing open just so hopefully it will not reopen in short order. 1/28; patient has superficial open wounds on the right lateral calf left anterior calf and left posterior calf. His edema control is adequate. 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 We have been using silver alginate as the primary dressing. He claims compliance with his compression pumps 2/4; patient has superficial open wounds on numerous areas of his left calf and again one on the left dorsal foot. The areas on the right lateral calf have healed. The cellulitis that I gave him doxycycline for last week is also resolved this was mostly on the left anterior calf just below the tibial tuberosity. His edema looks fairly well-controlled. He tells me he went to see his primary doctor today and had blood work ordered 2/11; once again he has several open areas on the left calf left tibial area. Most of these are small and appear to have healthy granulation. He does not have anything open on the right. The edema and control in his thighs is pretty good which is usually a good indication he has been using his pumps as requested. 2/18; he continues to have several small areas on the left calf and left tibial area. Most of these are small healthy granulation. We put him in his stocking on the right leg last week and he arrives with a superficial open area over the right upper tibia and a fairly large area on the right lateral tibia in similar  condition. His edema control actually does  not look too bad, he claims to be using his compression pumps twice a day 2/25. Continued small areas on the left calf and left tibial area. New areas especially on the right are identified just below the tibial tuberosity and on the right upper tibia itself. There are also areas of weeping edema fluid even without an obvious wound. He does not have a considerable degree of lymphedema but clearly there is more edema here than his skin can handle. He states he is using the pumps twice a day. We have an Unna boot on the right and 4 layer compression on the left. 3/3; he continues to have an area on the right lateral calf and right posterior calf just below the popliteal fossa. There is a fair amount of tenderness around the wound on the popliteal fossa but I did not see any evidence of cellulitis, could just be that the wrap came down and rubbed in this area. ooHe does not have an open area on the left leg however there is an area on the left dorsal foot at the base of the third toe ooWe have been using silver alginate to all wound areas 3/10; he did not have an open area on his left leg last time he was here a week ago. T oday he arrives with a horizontal wound just below the tibial tuberosity and an area on the left lateral calf. He has intense erythema and tenderness in this area. The area is on the right lateral calf and right posterior calf better than last week. We have been using silver alginate as usual 3/18 - Patient returns with 3 small open areas on left calf, and 1 small open area on right calf, the skin looks ok with no significant erythema, he continues the UNA boot on right and 4 layer compression on left. The right lateral calf wound is closed , the right posterior is small area. we will continue silver alginate to the areas. 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 He is been tolerating the dressing changes without complication. Fortunately she has made excellent improvement in regard to the overall status of his wounds. Unfortunately every time we cease wrapping him he ends up reopening in causing more significant issues at that point. Again I'm unsure of the best direction to take although I think the lymphedema clinic may be appropriate for him. 02/03/19 on evaluation today patient appears to be doing well in regard to the wounds that we saw him for last week unfortunately he has a new area on the proximal portion of his right medial/posterior lower extremity where the wrap somewhat slowed down and caused swelling and a blister to rub and open. Unfortunately this is the only opening that he has on either leg at this point. 02/17/19 on evaluation today patient's bilateral lower extremities appear to be doing well. He still completely healed in regard to the left lower extremity. In regard to the right lower extremity the area where the wrap and slid down and caused the blister still seems to be slightly open although this is dramatically better than during the last evaluation two weeks ago. I'm very pleased with the way this stands overall. 03/03/19 on evaluation today patient appears to be doing well in regard  to his right lower extremity in general although he did have a new blister open this does not appear to be showing any evidence of active infection at this time. Fortunately there's No fevers, chills, nausea, or vomiting noted at this time. Overall I feel like he is making good progress it does feel like that the right leg will we perform the [employer] seems to do with a bit better than three layer wrap on the left which slid down on him. We may switch to doing bilateral in the book wraps. 5/4; I have not seen Mr. Macmurray in quite some time. According to our case manager he  did not have an open wound on his left leg last week. He had 1 remaining wound on the right posterior medial calf. He arrives today with multiple openings on the left leg probably were blisters and/or wrap injuries from Unna boots. I do not think the Unna boot's will provide adequate compression on the left. I am also not clear about the frequency he is using the compression pumps. 03/17/19 on evaluation today patient appears to be doing excellent in regard to his lower extremities compared to last week's evaluation apparently. He had gotten significantly worse last week which is unfortunate. The [employer] wrap on the left did not seem to do very well for him at all and in fact it didn't control his swelling significantly enough he had an additional outbreak. Subsequently we go back to the four layer compression wrap on the left. This is good news. At least in that he is doing better and the wound seem to be killing him. He still has not heard anything from the lymphedema clinic. 03/24/19 on evaluation today patient actually appears to be doing much better in regard to his bilateral lower Trinity as compared to last week when I saw him. Fortunately there's no signs of active infection at this time. He has been tolerating the dressing changes without complication. Overall I'm extremely pleased with the progress and appearance in general. 04/07/19 on evaluation today patient appears to be doing well in regard to his bilateral lower extremities. His swelling is significantly down from where it was previous. With that being said he does have a couple blisters still open at this point but fortunately nothing that seems to be too severe and again the majority of the larger openings has healed at this time. 04/14/19 on evaluation today patient actually appears to be doing quite well in regard to his bilateral lower extremities in fact I'm not even sure there's anything significantly open at this time at any site.  Nonetheless he did have some trouble with these wraps where they are somewhat irritating him secondary to the fact that he has noted that the graph wasn't too close down to the end of this foot in a little bit short as well up to his knee. Otherwise things seem to be doing quite well. 04/21/19 upon evaluation today patient's wound bed actually showed evidence of being completely healed in regard to both lower extremities which is excellent news. There does not appear to be any signs of active infection which is also good news. I'm very pleased in this regard. No fevers, chills, nausea, or vomiting noted at this time. 04/28/19 on evaluation today patient appears to be doing a little bit worse in regard to both lower extremities on the left mainly due to the fact that when he went infection disease the wrap was not wrapped quite high enough  he developed a blister above this. On the right he is a small open area of nothing too significant but again this is continuing to give him some trouble he has been were in the Velcro compression that he has at home. 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 He's been tolerating the dressing changes without complication. Fortunately there's no signs of active infection at this time. No fevers, chills, nausea, or vomiting noted at this time. We have been trying to get an appointment with her lymphedema clinic in [facility] but unfortunately nobody can get them on phone with not been able to even fax information over the patient likewise is not been able to get in touch with them. Overall I'm not sure exactly what's going on here with to reach out again today. 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 Still having a lot of drainage unfortunately. He tells me especially in the left but even on the right.  There's no signs of active infection which is good news we've been using so ratcheted up to this point. 05/19/19 on evaluation today patient actually appears to be doing quite well with regard to his left lower extremity which is great news. Fortunately in regard to the right lower extremity has an issues with his wrap and he subsequently did remove this from what I'm understanding. Nonetheless long story short is what he had rewrapped once he removed it subsequently had maggots underneath this wrap whenever he came in for evaluation today. With that being said they were obviously completely cleaned away by the nursing staff. The visit today which is excellent news. However he does appear to potentially have some infection around the right ankle region where the maggots were located as well. He will likely require anabiotic therapy today. 05/26/19 on evaluation today patient actually appears to be doing much better in regard to his bilateral lower extremities. I feel like the infection is under much better control. With that being said there were maggots noted when the wrap was removed yet again today. Again this could have potentially been left over from previous although at this time there does not appear to be any signs of significant drainage there was obviously on the wrap some drainage as well this contracted gnats or otherwise. Either way I do not see anything that appears to be doing worse in my pinion and in fact I think his drainage has slowed down quite significantly likely mainly due to the fact to his infection being under better control. 06/02/2019 on evaluation today patient actually appears to be doing well with regard to his bilateral lower extremities there is no signs of active infection at this time which is great news. With that being said he does have several open areas more so on the right than the left but nonetheless these are all significantly better than previously  noted. 06/09/2019 on evaluation today patient actually appears to be doing well. His wrap stayed up and he did not cause any problems he had more drainage on the right compared to the left but overall I do not see any major issues at this time which is great news. 06/16/2019 on evaluation today patient appears to be doing excellent with regard to his lower extremities the only area that is open is a new blister that can have opened as of today on  the medial ankle on the left. Other than this he really seems to be doing great I see no major issues at this point. 06/23/2019 on evaluation today patient appears to be doing quite well with regard to his bilateral lower extremities. In fact he actually appears to be almost completely healed there is a small area of weeping noted of the right lower extremity just above the ankle. Nonetheless fortunately there is no signs of active infection at this time which is good news. No fevers, chills, nausea, vomiting, or diarrhea. 8/24; the patient arrived for a nurse visit today but complained of very significant pain in the left leg and therefore I was asked to look at this. Noted that he did not have an open area on the left leg last week nevertheless this was wrapped. The patient states that he is not been able to put his compression pumps on the left leg because of the discomfort. He has not been systemically unwell 06/30/2019 on evaluation today patient unfortunately despite being excellent last week is doing much worse with regard to his left lower extremity today. In fact he had to come in for a nurse on Monday where his left leg had to be rewrapped due to excessive weeping Dr. Leanord Hawking placed him on doxycycline at that point. Fortunately there is no signs of active infection Systemically at this time which is good news. 07/07/2019 in regard to the patient's wounds today he actually seems to be doing well with his right lower extremity there really is nothing open or  draining at this point this is great news. Unfortunately the left lower extremity is given him additional trouble at this time. There does not appear to be any signs of active infection nonetheless he does have a lot of edema and swelling noted at this point as well as blistering all of which has led to a much more poor appearing leg at this time compared to where it was 2 weeks ago when it was almost completely healed. Obviously this is a little discouraging for the patient. He is try to contact the lymphedema clinic in [place] he has not been able to get through to them. 07/14/2019 on evaluation today patient actually appears to be doing slightly better with regard to his left lower extremity ulcers. Overall I do feel like at least at the top of the wrap that we have been placing this area has healed quite nicely and looks much better. The remainder of the leg is showing signs of improvement. Unfortunately in the thigh area he still has an open region on the left and again on the right he has been utilizing just a Band-Aid on an area that also opened on the thigh. Again this is an area that were not able to wrap although we did do an Ace wrap to provide some compression that something that obviously is a little less effective than the compression wraps we have been using on the lower portion of the leg. He does have an appointment with the lymphedema clinic in [facility] on Friday. 07/21/2019 on evaluation today patient appears to be doing better with regard to his lower extremity ulcers. He has been tolerating the dressing changes without complication. Fortunately there is no signs of active infection at this time. No fevers, chills, nausea, vomiting, or diarrhea. I did receive the paperwork from the physical therapist at the lymphedema clinic in [place]. Subsequently I signed off on that this morning and sent that back to him for  further progression with the treatment plan. 07/28/2019 on  evaluation today patient appears to be doing very well with regard to his right lower extremity where I do not see any open wounds at this point. Fortunately he is feeling great as far as that is concerned as well. In regard to the left lower extremity he has been having issues with still several areas of weeping and edema although the upper leg is doing better his lower leg still I think is going require the compression wrap at this time. No fevers, chills, nausea, vomiting, or diarrhea. 08/04/2019 on evaluation today patient unfortunately is having new wounds on the right lower extremity. Again we have been using Unna boot wrap on that side. We switched him to using his juxta lite wrap at home. With that being said he tells me he has been using it although his legs extremely swollen and to be honest really does not appear that he has been. I cannot know that for sure however. Nonetheless he has multiple new wounds on the right lower extremity at this time. Obviously we will have to see about getting this rewrapped for him today. 08/11/2019 on evaluation today patient appears to be doing fairly well with regard to his wounds. He has been tolerating the dressing changes including the compression wraps without complication. He still has a lot of edema in his upper thigh regions bilaterally he is supposed to be seeing the lymphedema clinic on the 15th of this month once his wraps arrive for the upper part of his legs. 08/18/2019 on evaluation today patient appears to be doing well with regard to his bilateral lower extremities at this point. He has been tolerating the dressing changes without complication. Fortunately there is no signs of active infection which is also good news. He does have a couple weeping areas on the first and second toe of the right foot he also has just a small area on the left foot upper leg and a small area on the left lower leg but overall he is doing quite well in my opinion. He  is supposed to be getting his wraps shortly in fact tomorrow and then subsequently is seeing the lymphedema clinic next Wednesday on the 21st. Of note he is also leaving on the 25th to go on vacation for a week to the beach. For that reason and since there is some uncertainty about what there can be doing at lymphedema clinic next Wednesday I am get a make an appointment for next Friday here for [place] to see what we need to do for him prior to him leaving for vacation. 10/23; patient arrives in considerable pain predominantly in the upper posterior calf just distal to the popliteal fossa also in the wound anteriorly above the major wound. This is probably cellulitis and he has had this recurrently in the past. He has no open wound on the right side and he has had an Radio broadcast assistant in that area. Finally I note that he has an area on the left posterior calf which by enlarge is mostly epithelialized. This protrudes beyond the borders of the surrounding skin in the setting of dry scaly skin and lymphedema. The patient is leaving for [facility] on Sunday. Per his longstanding pattern, he will not take his compression pumps with him predominantly out of fear that they will be stolen. He therefore asked that we put a Unna boot back on the right leg. He will also contact the wound care center in  [facility] to see if they can change his dressing in the mid week. 11/3; patient returned from his vacation to [facility]. He was seen on 1 occasion at their wound care center. They did a 2 layer compression system as they did not have our 4-layer wrap. I am not completely certain what they put on the wounds. They did not change the Unna boot on the right. The patient is also seeing a lymphedema specialist physical therapist in [place]. It appears that he has some compression sleeve for his thighs which indeed look quite a bit better than I am used to seeing. He pumps over these with his external compression  pumps. 11/10; the patient has a new wound on the right medial thigh otherwise there is no open areas on the right. He has an area on the left leg posteriorly anteriorly and medially and an area over the left second toe. We have been using silver alginate. He thinks the injury on his thigh is secondary to friction from the compression sleeve he has. 11/17; the patient has a new wound on the right medial thigh last week. He thinks this is because he did not have a underlying stocking for his thigh juxta lite apparatus. He now has this. The area is fairly large and somewhat angry but I do not think he has underlying cellulitis. ooHe has a intact blister on the right anterior tibial area. ooSmall wound on the right great toe dorsally ooSmall area on the medial left calf. 11/30; the patient does not have any open areas on his right leg and we did not take his juxta lite stocking off. However he states that on Friday his compression wrap fell down lodging around his upper mid calf area. As usual this creates a lot of problems for him. He called urgently today to be seen for a nurse visit however the nurse visit turned into a provider visit because of extreme erythema and pain in the left anterior tibia extending laterally and posteriorly. The area that is problematic is extensive 10/06/2019 upon evaluation today patient actually appears to be doing poorly in regard to his left lower extremity. He Dr. Leanord Hawking did place him on doxycycline this past Monday apparently due to the fact that he was doing much worse in regard to this left leg. Fortunately the doxycycline does seem to be helping. Unfortunately we are still having a very difficult time getting his edema under any type of control in order to anticipate discharge at some point. The only way were really able to control his lymphedema really is with compression wraps and that has only even seemingly temporary. He has been seeing a lymphedema clinic  they are trying to help in this regard but still this has been somewhat frustrating in general for the patient. 10/13/19 on evaluation today patient appears to be doing excellent with regard to his right lower extremity as far as the wounds are concerned. His swelling is still quite extensive unfortunately. He is still having a lot of drainage from the thigh areas bilaterally which is unfortunate. He's been going to lymphedema clinic but again he still really does not have this edema under control as far as his lower extremities are concern. With regard to his left lower extremity this seems to be improving and I do believe the doxycycline has been of benefit for him. He is about to complete the doxycycline. 10/20/2019 on evaluation today patient appears to be doing poorly in regard to his bilateral  lower extremities. More in the right thigh he has a lot of irritation at this site unfortunately. In regard to the left lower extremity the wrap was not quite as high it appears and does seem to have caused him some trouble as well. Fortunately there is no evidence of systemic infection though he does have some blue-green drainage which has me concerned for the possibility of Pseudomonas. He tells me he is previously taking Cipro without complications and he really does not care for Levaquin however due to some of the side effects he has. He is not allergic to any medications specifically antibiotics that were aware of. 10/27/2019 on evaluation today patient actually does appear to be for the most part doing better when compared to last week's evaluation. With that being said he still has multiple open wounds over the bilateral lower extremities. He actually forgot to start taking the Cipro and states that he still has the whole bottle. He does have several new blisters on left lower extremity today I think I would recommend he go ahead and take the Cipro based on what I am seeing at  this point. 12/30-Patient comes at 1 week visit, 4 layer compression wraps on the left and Unna boot on the right, primary dressing Xtrasorb and silver alginate. Patient is taking his Cipro and has a few more days left probably 5-6, and the legs are doing better. He states he is using his compressions devices which I believe he has 11/10/2019 on evaluation today patient actually appears to be much better than last time I saw him 2 weeks ago. His wounds are significantly improved and overall I am very pleased in this regard. Fortunately there is no signs of active infection at this time. He is just a couple of days away from completing Cipro. Overall his edema is much better he has been using his lymphedema pumps which I think is also helping at this point. 11/17/2019 on evaluation today patient appears to be doing excellent in regard to his wounds in general. His legs are swollen but not nearly as much as they have been in the past. Fortunately he is tolerating the compression wraps without complication. No fevers, chills, nausea, vomiting, or diarrhea. He does have some erythema however in the distal portion of his right lower extremity specifically around the forefoot and toes there is a little bit of warmth here as well. 11/24/2019 on evaluation today patient appears to be doing well with regard to his right lower extremity I really do not see any open wounds at this point. His left lower extremity does have several open areas and his right medial thigh also is open. Other than this however overall the patient seems to be making good progress and I am very pleased at this point. 12/01/2019 on evaluation today patient appears to be doing poorly at this point in regard to his left lower extremity has several new blisters despite the fact that we have him in compression wraps. In fact he had a 4-layer compression wrap, his upper thigh wrapped from lymphedema clinic, and a juxta light over top of the 4  layer compression wrap the lymphedema clinic applied and despite all this he still develop blisters underneath. Obviously this does have me concerned about the fact that unfortunately despite what we are doing to try to get wounds healed he continues to have new areas arise I do not think he is ever good to be at the point where he can realistically just  use wraps at home to keep things under control. Typically when we heal him it takes about 1-2 days before he is back in the clinic with severe breakdown and blistering of his lower extremities bilaterally. This is happened numerous times in the past. Unfortunately I think that we may need some help as far as overall fluid overload to kind of limit what we are seeing and get things under better control. 12/08/2019 on evaluation today patient presents for follow-up concerning his ongoing bilateral lower extremity edema. Unfortunately he is still having quite a bit of swelling the compression wraps are controlling this to some degree but he did see Dr. [name] his cardiologist I do have that available for review today as far as the appointment was concerned that was on 12/06/2019. Obviously that she has been 2 days ago. The patient states that he is only been taking the Lasix 80 mg 1 time a day he had told me previously he was taking this twice a day. Nonetheless Dr. [name] recommended this be up to 80 mg 2 times a day for the patient as he did appear to be fluid overloaded. With that being said the patient states he did this yesterday and he was unable to go anywhere or do anything due to the fact that he was constantly having to urinate. Nonetheless I think that this is still good to be something that is important for him as far as trying to get his edema under control at all things that he is going to be able to just expect his wounds to get under control and things to be better without going through at least a period of time where he is trying to stabilize his  fluid management in general and I think increasing the Lasix is likely the first step here. It was also mentioned the possibility that the patient may require metolazone. With that being said he wanted to have the patient take Lasix twice a day first and then reevaluating 2 months to see where things stand. 12/15/2019 upon evaluation today patient appears to be doing regard to his legs although his toes are showing some signs of weeping especially on the left at this point to some degree on the right. There does not appear to be any signs of active infection and overall I do feel like the compression wraps are doing well for him but he has not been able to take the Lasix at home and the increased dose that Dr. [name] recommended. He tells me that just not go to be feasible for him. Nonetheless I think in this case he should probably send a message to Dr. [name] in order to discuss options from the standpoint of possible admission to get the fluid off or otherwise going forward. 12/22/2019 upon evaluation today patient appears to be doing fairly well with regard to his lower extremities at this point. In fact he would be doing excellent if it was not for the fact that his right anterior thigh apparently had an allergic reaction to adhesive tape that he used. The wound itself that we have been monitoring actually appears to be healed. There is a lot of irritation at this point. 12/29/2019 upon evaluation today patient appears to be doing well in regard to his lower extremities. His left medial thigh is open and somewhat draining today but this is the only region that is open the right has done much better with the treatment utilizing the steroid cream that I prescribed for  him last week. Overall I am pleased in that regard. Fortunately there is no signs of active infection at this time. No fevers, chills, nausea, vomiting, or diarrhea. 01/05/2020 upon evaluation today patient appears to be doing more poorly in  regard to his right lower extremity at this point upon evaluation today. Unfortunately he continues to have issues in this regard and I think the biggest issue is controlling his edema. This obviously is not very well controlled at this point is been recommended that he use the Lasix twice a day but he has not been able to do that. Unfortunately I think this is leading to an issue where honestly he is not really able to effectively control his edema and therefore the wounds really are not doing significantly better. I do not think that he is going to be able to keep things under good control unless he is able to control his edema much better. I discussed this again in great detail with him today. 01/12/2020 good news is patient actually appears to be doing quite well today at this point. He does have an appointment with lymphedema clinic tomorrow. His legs appear healed and the toe on the left is almost completely healed. In general I am very pleased with how things stand at this point. 01/19/2020 upon evaluation today patient appears to actually be doing well in regard to his lower extremities there is nothing open at this point. Fortunately he has done extremely well more recently. Has been seeing lymphedema clinic as well. With that being said he has Velcro wraps for his lower legs as well as his upper legs. The only wound really is on his toe which is the right great toe and this is barely anything even there. With all that being said I think it is good to be appropriate today to go ahead and switch him over to the Velcro compression wraps. 01/26/2020 upon evaluation today patient appears to be doing worse with regard to his lower extremities after last week switch him to Velcro compression wraps. Unfortunately he lasted less than 24 hours he did not have the sock portion of his Velcro wrap on the left leg and subsequently developed a blister underneath the Velcro portion. Obviously this is not good  and not what we were looking for at this point. He states the lymphedema clinic did tell him to wear the wrap for 23 hours and take him off for 1 I am okay with that plan but again right now we got a get things back under control again he may have some cellulitis noted as well. 02/02/2020 upon evaluation today patient unfortunately appears to have several areas of blistering on his bilateral lower extremities today mainly on the feet. His legs do seem to be doing somewhat better which is good news. Fortunately there is no evidence of active infection at this time. No fevers, chills, nausea, vomiting, or diarrhea. 02/16/2020 upon evaluation today patient appears to be doing well at this time with regard to his legs. He has a couple weeping areas on his toes but for the most part everything is doing better and does appear to be sealed up on his legs which is excellent news. We can continue with wrapping him at this point as he had every time we discontinue the wraps he just breaks out with new wounds. There is really no point in is going forward with this at this point. 03/08/2020 upon evaluation today patient actually appears to be doing quite  well with regard to his lower extremity ulcers. He has just a very superficial and really almost nonexistent blister on the left lower extremity he has in general done very well with the compression wraps. With that being said I do not see any signs of infection at this time which is good news. 03/29/2020 upon evaluation today patient appears to be doing well with regard to his wounds currently except for where he had several new areas that opened up due to some of the wrap slipping and causing him trouble. He states he did not realize they had slipped. Nonetheless he has a 1 area on the right and 3 new areas on the left. Fortunately there is no signs of active infection at this time which is great news. 04/05/2020 upon evaluation today patient actually appears to be  doing quite well in general in regard to his legs currently. Fortunately there is no signs of active infection at this time. No fevers, chills, nausea, vomiting, or diarrhea. He tells me next week that he will actually be seen in the lymphedema clinic on Thursday at 10 AM I see him on Wednesday next week. 04/12/2020 upon evaluation today patient appears to be doing very well with regard to his lower extremities bilaterally. In fact he does not appear to have any open wounds at this point which is good news. Fortunately there is no signs of active infection at this time. No fevers, chills, nausea, vomiting, or diarrhea. 04/19/2020 upon evaluation today patient appears to be doing well with regard to his wounds currently on the bilateral lower extremities. There does not appear to be any signs of active infection at this time. Fortunately there is no evidence of systemic infection and overall very pleased at this point. Nonetheless after I held him out last week he literally had blisters the next morning already which swelled up with him being right back here in the clinic. Overall I think that he is just not can be able to be discharged with his legs the way they are he is much to volume overloaded as far as fluid is concerned and that was discussed with him today of also discussed this but should try the clinic nurse manager as well as Dr. Leanord Hawking. 04/26/2020 upon evaluation today patient appears to be doing better with regard to his wounds currently. He is making some progress and overall swelling is under good control with the compression wraps. Fortunately there is no evidence of active infection at this time. Objective Constitutional Obese and well-hydrated in no acute distress. Vitals Time Taken: 10:52 AM, Height: 70 in, Weight: 380.2 lbs, BMI: 54.5, Temperature: 97.6 F, Pulse: 71 bpm, Respiratory Rate: 22 breaths/min, Blood Pressure: 166/66 mmHg. Respiratory normal breathing without  difficulty. Psychiatric this patient is able to make decisions and demonstrates good insight into disease process. Alert and Oriented x 3. pleasant and cooperative. General Notes: Patient's wound bed currently showed signs of good granulation at all wound locations there is no evidence of obvious active infection today we did perform a culture on Monday but that is not back as of yet. Nonetheless he looks much better from a wound standpoint today compared to Monday according to the intake nurse. Integumentary (Hair, Skin) Wound #172 status is Open. Original cause of wound was Blister. The wound is located on the Right T Great. The wound measures 0.7cm length x 0.6cm oe width x 0.1cm depth; 0.33cm^2 area and 0.033cm^3 volume. There is no tunneling or undermining noted. There is  a small amount of serosanguineous drainage noted. The wound margin is distinct with the outline attached to the wound base. There is large (67-100%) red granulation within the wound bed. There is no necrotic tissue within the wound bed. Wound #173 status is Open. Original cause of wound was Blister. The wound is located on the Left,Medial Lower Leg. The wound measures 0cm length x 0cm width x 0cm depth; 0cm^2 area and 0cm^3 volume. Wound #174 status is Open. Original cause of wound was Blister. The wound is located on the Left,Dorsal Foot. The wound measures 1.9cm length x 4.1cm width x 0.1cm depth; 6.118cm^2 area and 0.612cm^3 volume. There is Fat Layer (Subcutaneous Tissue) Exposed exposed. There is no tunneling or undermining noted. There is a large amount of serosanguineous drainage noted. The wound margin is flat and intact. There is medium (34-66%) red, pink granulation within the wound bed. There is a medium (34-66%) amount of necrotic tissue within the wound bed including Adherent Slough. Wound #175 status is Open. Original cause of wound was Gradually Appeared. The wound is located on the Right,Posterior Lower Leg.  The wound measures 2cm length x 1.2cm width x 0.1cm depth; 1.885cm^2 area and 0.188cm^3 volume. There is Fat Layer (Subcutaneous Tissue) Exposed exposed. There is no tunneling or undermining noted. There is a medium amount of serosanguineous drainage noted. The wound margin is distinct with the outline attached to the wound base. There is large (67-100%) pink, pale granulation within the wound bed. There is no necrotic tissue within the wound bed. 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 There was a Four Layer Compression Therapy Procedure by Yevonne Pax, RN. Post procedure Diagnosis Wound #174: Same as Pre-Procedure Wound #175 Pre-procedure diagnosis of Wound #175 is a Lymphedema located on the Right,Posterior Lower Leg . There was a Radio broadcast assistant Compression Therapy Procedure by Yevonne Pax, RN. Post procedure Diagnosis Wound #175: Same as Pre-Procedure Plan Follow-up Appointments: Return Appointment in 1 week. Dressing Change Frequency: Do not change entire dressing for one week. - both legs Skin Barriers/Peri-Wound Care: Moisturizing lotion - both legs Wound Cleansing: May shower with protection. Primary Wound Dressing: Wound #172 Right T Great: oe Calcium Alginate with Silver Wound #174 Left,Dorsal Foot: Calcium Alginate with Silver Wound #175 Right,Posterior Lower Leg: Calcium Alginate with Silver Secondary Dressing: Wound #172 Right T Great: oe Dry Gauze ABD  pad - as needed Wound #175 Right,Posterior Lower Leg: Dry Gauze ABD pad - as needed Wound #174 Left,Dorsal Foot: Dry Gauze ABD pad - as needed Edema Control: 4 layer compression: Left lower extremity Unna Boot to Right Lower Extremity Avoid standing for long periods of time Elevate legs to the level of the heart or above for 30 minutes daily and/or when sitting, a frequency of: - throughout the day Exercise regularly 1. I would recommend currently that we continue with the compression wraps. We are using a 4-layer compression wrap on the left lower extremity and Unna boot on the right lower extremity. 2. I will also recommend we continue with silver alginate dressing to any open wound locations.  3. I would also suggest the patient needs to elevate his legs much as possible. He should also be tried exercise regularly at this point and taking his fluid pills. We will see patient back for reevaluation in 1 week here in the clinic. If anything worsens or changes patient will contact our office for additional recommendations. Electronic Signature(s) Signed: 04/26/2020 11:55:00 AM By: Lenda Kelp PA-C Entered By: Lenda Kelp on 04/26/2020 11:55:00 -------------------------------------------------------------------------------- SuperBill Details Patient Name: Date of Service: Kevin [name], Keltin J. 04/26/2020 Medical Record Number: [id number] Patient Account Number: [id number] Date of Birth/Sex: Treating RN: 1951-11-01 (69 y.o. [name] Primary Care Provider: Marney Setting, [name] Other Clinician: Referring Provider: Treating Provider/Extender: Suzy Bouchard, [name] in Treatment: 222 Diagnosis Coding ICD-10 Codes Code Description [phone] Non-pressure chronic ulcer of right calf limited to breakdown of skin L97.221 Non-pressure chronic ulcer of left calf limited to breakdown of skin 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 of  bilateral lower extremity I89.0 Lymphedema, not elsewhere classified E11.622 Type 2 diabetes mellitus with other skin ulcer 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, unspecified L03.116 Cellulitis of left lower limb Facility Procedures CPT4 Code: [id number] (Fa Description: (Facility Use Only) [phone] - APPLY UNNA BOOT RT cility Use Only) [id number] - APPLY MULTLAY COMPRS LWR LT LEG 59 1 Modifier: Quantity: 1 Electronic Signature(s) Signed: 04/26/2020 4:25:30 PM By: Zenaida Deed RN, BSN Signed: 04/28/2020 3:47:20 PM By: Lenda Kelp PA-C Previous Signature: 04/26/2020 11:55:21 AM Version By: Lenda Kelp PA-C Entered By: Zenaida Deed on 04/26/2020 11:55:51

## 2020-04-27 LAB — AEROBIC CULTURE W GRAM STAIN (SUPERFICIAL SPECIMEN)

## 2020-04-28 DIAGNOSIS — I482 Chronic atrial fibrillation, unspecified: Secondary | ICD-10-CM | POA: Diagnosis not present

## 2020-04-28 DIAGNOSIS — I10 Essential (primary) hypertension: Secondary | ICD-10-CM | POA: Diagnosis not present

## 2020-04-28 DIAGNOSIS — Z7901 Long term (current) use of anticoagulants: Secondary | ICD-10-CM | POA: Diagnosis not present
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
## 2020-05-01 ENCOUNTER — Other Ambulatory Visit: Payer: Self-pay

## 2020-05-01 ENCOUNTER — Telehealth (INDEPENDENT_AMBULATORY_CARE_PROVIDER_SITE_OTHER): Payer: Medicare Other | Admitting: Infectious Diseases

## 2020-05-01 DIAGNOSIS — L03119 Cellulitis of unspecified part of limb: Secondary | ICD-10-CM | POA: Diagnosis not present

## 2020-05-01 NOTE — Patient Instructions (Signed)
Please continue to work on weight loss with your clinic - this would be helpful for your health overall, in particular your ability to avoid recurrent cellulitis.   I would like for you to continue working with your wound care team and continue your monthly injections to prevent flares.

## 2020-05-01 NOTE — Progress Notes (Signed)
Name: [name]  DOB: [birth year]  MRN: [id number]  PCP: Tammi Sou, MD  Referring Provider: Dr. Dellia Nims   Virtual Visit via [place] [facility]  I connected with [name] on 05/01/20 at 10:15 AM EDT by [place] and verified that I am speaking with the correct person using two identifiers.   I discussed the limitations, risks, security and privacy concerns of performing an evaluation and management via virtual service and the availability of in person appointments. I also discussed with the patient that there may be a patient responsible charge related to this service. The patient expressed understanding and agreed to proceed.  Patient Location: [facility] in [place], [place] Provider Location: [facility]  Patient Active Problem List   Diagnosis Date Noted  . Uncontrolled type 2 diabetes mellitus with hyperglycemia ([place]) 03/04/2018  . Chronic diastolic heart failure ([place]) 12/24/2016  . Lymphedema 02/22/2016  . Diabetes with neurologic complications ([place]) 03/47/4259  . Chronic headaches 05/10/2015  . Equivocal stress test 10/24/2014  . Atrial fibrillation ([place]) 08/15/2014  . Healthcare maintenance 08/15/2014  . Type 2 diabetes mellitus with diabetic neuropathy ([place]) 08/15/2014  . 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 Recurrent cellulitis of lower extremity 08/02/2013  . Lymphedema of both lower extremities 11/04/2008  . Morbid obesity due to excess calories ([place]) 12/14/2007  . Venous (peripheral) insufficiency 07/22/2007  . Diabetic renal disease ([place]) 04/20/2007  . DM2 (diabetes mellitus, type 2) ([place]) 08/11/2006  . Hypertension, essential 08/11/2006  . ALLERGIC RHINITIS 08/11/2006  . ASTHMA 08/11/2006  . Asthma 08/11/2006   Subjective:   CC:  Follow up on recurrent lower extremity cellulitis infections on prophylactic bicillin injections.    HPI: Just started back with the Bariatric center at [facility] center and happy about that. In  speaking with wound care team he will likely need to stay in the medicated wraps  He continues to follow with Lymphedema clinic and has been using new straps and wraps. He blistered up shortly after these were removed however. He has been compliant with the Bicillin injections every 4 weeks and has not had an infection in some time. He is pleased with the results.   He is working on snacking and controlling this for weight control.    Review of Systems  Constitutional: Negative for chills, fever, malaise/fatigue and weight loss.  HENT: Negative for sore throat.   Respiratory: Negative for cough and sputum production.   Cardiovascular: Positive for leg swelling. Negative for chest pain, palpitations and claudication.  Gastrointestinal: Negative for abdominal pain, diarrhea and vomiting.  Genitourinary: Negative for dysuria and flank pain.  Musculoskeletal: Negative for joint pain.  Skin: Negative for rash.  Neurological: Negative for dizziness and headaches.    Past Medical History:  Diagnosis Date  . ALLERGIC RHINITIS 08/11/2006  . ASTHMA 08/11/2006  . Chronic atrial fibrillation ([place]) 08/2014  . Chronic combined systolic and diastolic CHF (congestive heart failure) [facility])    Cardiology f/u 12/2017: pt volume overloaded (R heart dysf suspected), BNP very high, lasix increased.  Repeat echo 12/2017: normal LV EF, mild DD, +RV syst dysfxn, mod pulm HTN, biatrial enlgmt.  . Chronic constipation   . Chronic renal insufficiency, stage 2 (mild)    Borderline stage III (GFR 60s).  . DIABETES MELLITUS, TYPE II 08/11/2006  . DM W/RENAL MANIFESTATIONS, TYPE II 04/20/2007  . HYPERTENSION 08/11/2006  . Impaired mobility and endurance   . MRSA infection 06/2018   LL venous stasis ulcer infected  .  Normocytic anemia 2016-2019   03/2018 B12 normal, iron ok (ferritin borderline low).  Plan repeat CBC at f/u summer 2019 and if decreased from baseline will check hemoccults and iron labs again.  . OBESITY,  MORBID 12/14/2007  . OSA (obstructive sleep apnea)    to get sleep study with [facility]) as of 12/03/2018 consult.  . 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  [id number] ID started him on amoxil prophylaxis---ineffective.  End 2018/Jan 2019 penicillin G injections prophyl helpful but pt declined to continue this as of 01/2018 ID f/u.  ID talked him into resuming monthly penicillin G as of 02/2018 f/u.    Marland Kitchen Restless leg syndrome    Rx'd clonazepam 09/2017 and pt refused to take it after reading the medication's potential side effects.  . Venous stasis ulcers of both lower extremities (HCC)    Severe lymphedema.  wound clinic care ongoing as of 01/2018   Outpatient Medications Prior to Visit  Medication Sig Dispense Refill  . arginine 500 MG tablet Take by mouth.    Marland Kitchen b complex vitamins capsule Take 1 capsule by mouth every morning.    . butalbital-acetaminophen-caffeine (FIORICET WITH CODEINE) 50-325-40-30 MG capsule TAKE 1 TO 2 CAPSULES BY  MOUTH EVERY 6 HOURS AS  NEEDED FOR HEADACHE(S)  MANUFACTURER RECOMMENDS NOT EXCEEDING 6 CAPSULES/DAY 60 capsule 0  . cetirizine (ZYRTEC) 10 MG tablet Take 10 mg by mouth daily.    . Coenzyme Q10 (CO Q 10) 10 MG CAPS Take by mouth. Reported on 02/19/2016    . diltiazem (TIAZAC) 240 MG 24 hr capsule TAKE 1 CAPSULE BY MOUTH  DAILY 90 capsule 0  . furosemide (LASIX) 40 MG tablet TAKE 2 TABLETS BY MOUTH TWO TIMES DAILY (Patient taking differently: Take 80 mg in the mornings and 40 mg in the evenings) 360 tablet 3  . Glutamine 500 MG CAPS Take by mouth.    Marland Kitchen HYDROcodone-acetaminophen (NORCO/VICODIN) 5-325 MG tablet 1-2 tabs po bid prn pain 60 tablet 0  . Insulin Lispro Prot & Lispro (HUMALOG MIX 75/25 KWIKPEN) (75-25) 100 UNIT/ML Kwikpen INJECT SUBCUTANEOUSLY 27 UNITS EVERY MORNING AND 22 UNITS EVERY EVENING AT  SUPPER. 45 mL 3  . Insulin Pen Needle (B-D ULTRAFINE III SHORT PEN) 31G X 8  MM MISC USE 1 PEN NEEDLE TWICE DAILY 100 each 0  . lisinopril (ZESTRIL) 40 MG tablet TAKE 1 TABLET BY MOUTH  DAILY 90 tablet 1  . metFORMIN (GLUCOPHAGE) 1000 MG tablet TAKE 1 TABLET BY MOUTH  TWICE DAILY WITH MEALS 180 tablet 1  . metoprolol tartrate (LOPRESSOR) 50 MG tablet TAKE 1 AND 1/2 TABLETS BY  MOUTH TWICE DAILY 270 tablet 3  . Multiple Vitamin (MULTIVITAMIN) tablet Take 1 tablet by mouth daily.    [name] ULTRA test strip CHECK BLOOD SUGAR TWICE  DAILY 200 strip 3  . rivaroxaban (XARELTO) 20 MG TABS tablet TAKE 1 TABLET BY MOUTH ONCE DAILY WITH SUPPER 90 tablet 3  . saw palmetto 500 MG capsule Take 500 mg by mouth daily.    Marland Kitchen terazosin (HYTRIN) 10 MG capsule TAKE 1 CAPSULE BY MOUTH  ONCE DAILY AT BEDTIME 90 capsule 3  . vitamin E 400 UNIT capsule Take 400 Units by mouth every morning. Selenium 50mg     . Insulin Pen Needle (BD ULTRA-FINE PEN NEEDLES) 29G X 12.7MM MISC 1 each by Other route 2 (two) times daily. 200 each 2  . Insulin Pen Needle (  BD ULTRA-FINE PEN NEEDLES) 29G X 12.7MM MISC USE TWICE DAILY 200 each 3  . triamcinolone ointment (KENALOG) 0.1 % APPLY OINTMENT WITH EACH DRESSING CHANGE TO LEGS AS DIRECTED (Patient not taking: Reported on 05/01/2020)     Facility-Administered Medications Prior to Visit  Medication Dose Route Frequency Provider Last Rate Last Admin  . penicillin g benzathine (BICILLIN LA) 1200000 UNIT/2ML injection 600,000 Units  600,000 Units Intramuscular Q30 days [name], MD   600,000 Units at 04/12/20 0933  . penicillin g benzathine (BICILLIN LA) 1200000 UNIT/2ML injection 600,000 Units  600,000 Units Intramuscular Q30 days [name], MD   600,000 Units at 04/12/20 0936  . penicillin g benzathine (BICILLIN LA) 1200000 UNIT/2ML injection 600,000 Units  600,000 Units Intramuscular Q30 days [name], MD   600,000 Units at 03/08/20 0934  . penicillin g benzathine (BICILLIN LA) 1200000 UNIT/2ML injection 600,000 Units  600,000 Units  Intramuscular Q30 days [name], MD   600,000 Units at 03/08/20 8921   Allergies  Allergen Reactions  . Other Other (See Comments)    Sneezing, coughing    Physical Exam and Objective Findings: There were no vitals filed for this visit. There is no height or weight on file to calculate BMI.  Physical Exam HENT:     Head: Normocephalic.  Eyes:     General: No scleral icterus.    Pupils: Pupils are equal, round, and reactive to light.  Pulmonary:     Effort: Pulmonary effort is normal.  Musculoskeletal:     Cervical back: Normal range of motion.  Neurological:     Mental Status: He is alert and oriented to person, place, and time.  Psychiatric:        Mood and Affect: Mood and affect normal.        Judgment: Judgment normal.      ASSESSMENT & PLAN:  Problem List Items Addressed This Visit      Unprioritized   Morbid obesity due to excess calories (HCC)    Encouraged to continue working with bariatric clinic for weigh tloss - this should improve his lymphedema and lower risk for recurrent cellulitis.       Recurrent cellulitis of lower extremity    He has had no flares of cellulitis requiring treatment based on his report and in reading over the notes from wound care team. He has been compliant with injections and is pleased with the results.  Unfortunately he continues to struggle with recurrent blisters and will continue to require mediated wraps.  Will continue injections monthly as is - will ask his PCP if they can facilitate ongoing treatments. He is very appreciative of their help give our office access is challenging for him.         Follow Up Instructions: Continue Bicillin 1.2 million units IM Q4 weeks FU again in 8 months    I discussed the assessment and treatment plan with the patient. The patient was provided an opportunity to ask questions and all were answered. The patient agreed with the plan and demonstrated an understanding of the  instructions.   The patient was advised to call back or seek an in-person evaluation if the symptoms worsen or if the condition fails to improve as anticipated.   Rexene Alberts, MSN, NP-C [facility]  [email or web address] Pager: [phone] Office: [phone] [facility] Main Line: [phone]   05/01/2020

## 2020-05-01 NOTE — Assessment & Plan Note (Signed)
He has had no flares of cellulitis requiring treatment based on his report and in reading over the notes from wound care team. He has been compliant with injections and is pleased with the results.  Unfortunately he continues to struggle with recurrent blisters and will continue to require mediated wraps.  Will continue injections monthly as is - will ask his PCP if they can facilitate ongoing treatments. He is very appreciative of their help give our office access is challenging for him.

## 2020-05-01 NOTE — Assessment & Plan Note (Signed)
Encouraged to continue working with bariatric clinic for weigh tloss - this should improve his lymphedema and lower risk for recurrent cellulitis.

## 2020-05-03 ENCOUNTER — Other Ambulatory Visit: Payer: Self-pay

## 2020-05-03 ENCOUNTER — Encounter (HOSPITAL_BASED_OUTPATIENT_CLINIC_OR_DEPARTMENT_OTHER): Payer: Medicare Other | Admitting: Physician Assistant

## 2020-05-03 DIAGNOSIS — E1151 Type 2 diabetes mellitus with diabetic peripheral angiopathy without gangrene: Secondary | ICD-10-CM | POA: Diagnosis not present

## 2020-05-03 DIAGNOSIS — E1121 Type 2 diabetes mellitus with diabetic nephropathy: Secondary | ICD-10-CM | POA: Diagnosis not present

## 2020-05-03 DIAGNOSIS — Z79899 Other long term (current) drug therapy: Secondary | ICD-10-CM | POA: Diagnosis not present

## 2020-05-03 DIAGNOSIS — E11622 Type 2 diabetes mellitus with other skin ulcer: Secondary | ICD-10-CM | POA: Diagnosis not present

## 2020-05-03 DIAGNOSIS — Z794 Long term (current) use of insulin: Secondary | ICD-10-CM | POA: Diagnosis not present

## 2020-05-03 DIAGNOSIS — I890 Lymphedema, not elsewhere classified: Secondary | ICD-10-CM | POA: Diagnosis not present

## 2020-05-03 DIAGNOSIS — I87333 Chronic venous hypertension (idiopathic) with ulcer and inflammation of bilateral lower extremity: Secondary | ICD-10-CM | POA: Diagnosis not present

## 2020-05-03 DIAGNOSIS — E1140 Type 2 diabetes mellitus with diabetic neuropathy, unspecified: Secondary | ICD-10-CM | POA: Diagnosis not present

## 2020-05-03 DIAGNOSIS — L97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 right calf limited to breakdown of skin: Secondary | ICD-10-CM | POA: Diagnosis not present

## 2020-05-03 DIAGNOSIS — L97221 Non-pressure chronic ulcer of left calf limited to breakdown of skin: Secondary | ICD-10-CM | POA: Diagnosis not present

## 2020-05-03 DIAGNOSIS — L03116 Cellulitis of left lower limb: Secondary | ICD-10-CM | POA: Diagnosis not present

## 2020-05-03 NOTE — Progress Notes (Addendum)
HAWK, MONES ([id number]) Visit Report for 05/03/2020 SuperBill Details Patient Name: Date of Service: [place] [name], [name] 05/03/2020 Medical Record Number: [id number] Patient Account Number: [id number] Date of Birth/Sex: Treating RN: 06/10/1951 (69 y.o. [name] Primary Care Provider: Marney Setting, Kevin Other Clinician: Referring Provider: Treating Provider/Extender: Lenda Kelp [facility] [name], [name]: [phone] Diagnosis Coding ICD-10 Codes Code Description [phone] Non-pressure chronic ulcer of right calf limited to breakdown of skin L97.221 Non-pressure chronic ulcer of left calf limited to breakdown of skin 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 of bilateral lower extremity I89.0 Lymphedema, not elsewhere classified E11.622 Type 2 diabetes mellitus with other skin ulcer 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, unspecified L03.116 Cellulitis of left lower limb Facility Procedures CPT4 Code Description Modifier Quantity [id number] (Facility Use Only) [phone] - APPLY MULTLAY COMPRS LWR LT LEG 1 [id number] (Facility Use Only) [id number] - APPLY UNNA BOOT RT 59 1 Electronic Signature(s) Signed: 05/03/2020 5:49:00 PM By: [name] RN, BSN Signed: 05/03/2020 5:59:30 PM By: Lenda Kelp PA-C Entered By: [name] on 05/03/2020 14:44:13
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
## 2020-05-05 DIAGNOSIS — Z713 Dietary counseling and surveillance: Secondary | ICD-10-CM | POA: Diagnosis not present

## 2020-05-10 ENCOUNTER — Ambulatory Visit: Payer: Medicare Other

## 2020-05-10 ENCOUNTER — Encounter (HOSPITAL_BASED_OUTPATIENT_CLINIC_OR_DEPARTMENT_OTHER): Payer: Medicare Other | Attending: Physician Assistant | Admitting: Physician Assistant

## 2020-05-10 DIAGNOSIS — I872 Venous insufficiency (chronic) (peripheral): Secondary | ICD-10-CM | POA: Insufficient documentation

## 2020-05-10 DIAGNOSIS — E669 Obesity, unspecified: Secondary | ICD-10-CM | POA: Insufficient documentation

## 2020-05-10 DIAGNOSIS — L03116 Cellulitis of left lower limb: Secondary | ICD-10-CM | POA: Diagnosis not present

## 2020-05-10 DIAGNOSIS — I482 Chronic atrial fibrillation, unspecified: Secondary | ICD-10-CM | POA: Insufficient documentation

## 2020-05-10 DIAGNOSIS — E11622 Type 2 diabetes mellitus with other skin ulcer: Secondary | ICD-10-CM | POA: Insufficient documentation

## 2020-05-10 DIAGNOSIS — I890 Lymphedema, not elsewhere classified: Secondary | ICD-10-CM | POA: Insufficient documentation

## 2020-05-10 DIAGNOSIS — Z6841 Body Mass Index (BMI) 40.0 and over, adult: Secondary | ICD-10-CM | POA: Diagnosis not present

## 2020-05-10 DIAGNOSIS — I87333 Chronic venous hypertension (idiopathic) with ulcer and inflammation of bilateral lower extremity: Secondary | ICD-10-CM | POA: Insufficient documentation

## 2020-05-10 DIAGNOSIS — I110 Hypertensive heart disease with heart failure: Secondary | ICD-10-CM | POA: Diagnosis not present

## 2020-05-10 DIAGNOSIS — L97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 right calf limited to breakdown of skin: Secondary | ICD-10-CM | POA: Insufficient documentation

## 2020-05-10 DIAGNOSIS — L97221 Non-pressure chronic ulcer of left calf limited to breakdown of skin: Secondary | ICD-10-CM | POA: Diagnosis not present

## 2020-05-10 DIAGNOSIS — E1151 Type 2 diabetes mellitus with diabetic peripheral angiopathy without gangrene: Secondary | ICD-10-CM | POA: Insufficient documentation

## 2020-05-10 DIAGNOSIS — I272 Pulmonary hypertension, unspecified: Secondary | ICD-10-CM | POA: Insufficient documentation

## 2020-05-10 DIAGNOSIS — I509 Heart failure, unspecified: Secondary | ICD-10-CM | POA: Diagnosis not present

## 2020-05-10 DIAGNOSIS — E1140 Type 2 diabetes mellitus with diabetic neuropathy, unspecified: Secondary | ICD-10-CM | POA: Insufficient documentation

## 2020-05-10 DIAGNOSIS — Z794 Long term (current) use of insulin: Secondary | ICD-10-CM | POA: Insufficient documentation

## 2020-05-10 NOTE — Progress Notes (Addendum)
ABDIEL, BLACKERBY ([id number]) Visit Report for 05/10/2020 Chief Complaint Document Details Patient Name: Date of Service: CO LINDON, KIEL 05/10/2020 10:30 A M Medical Record Number: [id number] Patient Account Number: [id number] Date of Birth/Sex: Treating RN: 1951-09-04 (69 y.o. [name] Primary Care Provider: Marney Setting, [name] Other Clinician: Referring Provider: Treating Provider/Extender: Lenda Kelp [facility] [name], [name] in Treatment: 224 Information Obtained from: Patient Chief Complaint patient is here for evaluation venous/lymphedema weeping Electronic Signature(s) Signed: 05/10/2020 10:43:29 AM By: Lenda Kelp PA-C Entered By: Lenda Kelp on 05/10/2020 10:43:28 -------------------------------------------------------------------------------- HPI Details Patient Name: Date of Service: [name], Niccolo J. 05/10/2020 10:30 A M Medical Record Number: [id number] Patient Account Number: [id number] Date of Birth/Sex: Treating RN: 03-07-1951 (69 y.o. [name] Primary Care Provider: Marney Setting, [name] Other Clinician: Referring Provider: Treating Provider/Extender: Lenda Kelp [facility] [name], [name] in Treatment: 224 History of Present Illness HPI Description: Referred by PCP for consultation. Patient has long standing history of BLE venous stasis, no prior ulcerations. At beginning of month, developed cellulitis and weeping. Received IM Rocephin followed by Keflex and resolved. Wears compression stocking, appr 6 months old. Not sure strength. No present drainage. 01/22/16 this is a patient who is a type II diabetic on insulin. 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 He tells me he religiously wears pressure stockings of uncertain strength. He was here with weeping edema about 8 months ago but did not have an open wound. Roughly a month ago he had a reopening on his bilateral legs. He is been using bandages and Neosporin. He  does not complain of pain. 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 Last BUN/creatinine I have is from 11/20/15 at 13 and 1.0 respectively 01/29/16; patient arrives today having tolerated the Profore wrap. He brought in his stockings and these are 18 mmHg stockings he bought from [place]. The compression here is likely inadequate. He does not complain of pain or excessive drainage she has no systemic symptoms. The wound on the right looks improved as does the one on the left although one on the left is more substantial with still tissue at risk below the actual wound area on the bilateral posterior calf 02/05/16; patient arrives with poor edema control. He states that we did put a 4 layer compression on it last week. No weight appear 5 this. 02/12/16; the area on the posterior right Has healed. The left Has a substantial wound that has necrotic surface eschar that requires a debridement with a curette. 02/16/16;the patient called or a Nurse visit secondary to increased swelling. He had been in earlier in the week with his right leg healed. He was transitioned to is on pressure stocking on the right leg with the only open wound on the left, a substantial area on the left posterior calf. Note he has a history of severe lower extremity edema, he has a history of chronic atrial fibrillation but not heart failure per my notes but I'll need to research this. He is not complaining of chest pain shortness of breath or orthopnea. The intake nurse noted blisters on the previously closed right leg 02/19/16; this is the patient's regular visit day. I see him on Friday with escalating edema new wounds on the right leg and clear signs of at least right ventricular heart failure. I increased his Lasix to  40 twice a day. He is returning currently in follow-up. States he is noticed a decrease in that the  edema 02/26/16 patient's legs have much less edema. There is nothing really open on the right leg. The left leg has improved condition of the large superficial wound on the posterior left leg 03/04/16; edema control is very much better. The patient's right leg wounds have healed. On the left leg he continues to have severe venous inflammation on the posterior aspect of the left leg. There is no tenderness and I don't think any of this is cellulitis. 03/11/16; patient's right leg is married healed and he is in his own stocking. The patient's left leg has deteriorated somewhat. There is a lot of erythema around the wound on the posterior left leg. There is also a significant rim of erythema posteriorly just above where the wrap would've ended there is a new wound in this location and a lot of tenderness. Can't rule out cellulitis in this area. 03/15/16; patient's right leg remains healed and he is in his own stocking. The patient's left leg is much better than last review. His major wound on the posterior aspect of his left Is almost fully epithelialized. He has 3 small injuries from the wraps. Really. Erythema seems a lot better on antibiotics 03/18/16; right leg remains healed and he is in his own stocking. The patient's left leg is much better. The area on the posterior aspect of the left calf is fully epithelialized. His 3 small injuries which were wrap injuries on the left are improved only one seems still open his erythema has resolved 03/25/16; patient's right leg remains healed and he is in his own stocking. There is no open area today on the left leg posterior leg is completely closed up. His wrap injuries at the superior aspect of his leg are also resolved. He looks as though he has some irritation on the dorsal ankle but this is fully epithelialized without evidence of infection. 03/28/16; we discharged this patient on Monday. Transitioned him into his own stocking. There were problems almost  immediately with uncontrolled swelling weeping edema multiple some of which have opened. He does not feel systemically unwell in particular no chest pain no shortness of breath and he does not feel 04/08/16; the edema is under better control with the Profore light wrap but he still has pitting edema. There is one large wound anteriorly 2 on the medial aspect of his left leg and 3 small areas on the superior posterior calf. Drainage is not excessive he is tolerating a Profore light well 04/15/16; put a Profore wrap on him last week. This is controlled is edema however he had a lot of pain on his left anterior foot most of his wounds are healed 04/22/16 once again the patient has denuded areas on the left anterior foot which he states are because his wrap slips up word. He saw his primary physician today is on Lasix 40 twice a day and states that he his weight is down 20 pounds over the last 3 months. 04/29/16: Much improved. left anterior foot much improved. He is now on Lasix 80 mg per day. Much improved edema control 05/06/16; I was hoping to be able to discharge him today however once again he has blisters at a low level of where the compression was placed last week mostly on his left lateral but also his left medial leg and a small area on the anterior part of the left foot. 05/09/16;  apparently the patient went home after his appointment on 7/4 later in the evening developing pain in his upper medial thigh together with subjective fever and chills although his temperature was not taken. The pain was so intense he felt he would probably have to call 911. However he then remembered that he had leftover doxycycline from a previous round of antibiotics and took these. By the next morning he felt a lot better. He called and spoke to one of our nurses and I approved doxycycline over the phone thinking that this was in relation to the wounds we had previously seen although they were definitely were not. The  patient feels a lot better old fever no chills he is still working. Blood sugars are reasonably controlled 05/13/16; patient is back in for review of his cellulitis on his anterior medial upper thigh. He is taking doxycycline this is a lot better. Culture I did of the nodular area on the dorsal aspect of his foot grew MRSA this also looks a lot better. 05/20/16; the patient is cellulitis on the medial upper thigh has resolved. All of his wound areas including the left anterior foot, areas on the medial aspect of the left calf and the lateral aspect of the calf at all resolved. He has a new blister on the left dorsal foot at the level of the fourth toe this was excised. No evidence of infection 05/27/16; patient continues to complain weeping edema. He has new blisterlike wounds on the left anterior lateral and posterior lateral calf at the top of his wrap levels. The area on his left anterior foot appears better. He is not complaining of fever, pain or pruritus in his feet. 05/30/16; the patient's blisters on his left anterior leg posterior calf all look improved. He did not increase the Lasix 100 mg as I suggested because he was going to run out of his 40 mg tablets. He is still having weeping edema of his toes 06/03/16; I renewed his Lasix at 80 mg once a day as he was about to run out when I last saw him. He is on 80 mg of Lasix now. I have asked him to cut down on the excessive amount of water he was drinking and asked him to drink according to his thirst mechanisms 06/12/2016 -- was seen 2 days ago and was supposed to wear his compression stockings at home but he is developed lymphedema and superficial blisters on the left lower extremity and hence came in for a review 06/24/16; the remaining wound is on his left anterior leg. He still has edema coming from between his toes. There is lymphedema here however his edema is generally better than when I last saw this. He has a history of atrial fibrillation  but does not have a known history of congestive heart failure nevertheless I think he probably has this at least on a diastolic basis. 07/01/16 I reviewed his echocardiogram from January 2017. This was essentially normal. He did not have LVH, EF of 55-60%. His right ventricular function was normal although he did have trivial tricuspid and pulmonic regurgitation. This is not audible on exam however. I increased his Lasix to do massive edema in his legs well above his knees I think in early July. He was also drinking an excessive amount of water at the time. 07/15/16; missed his appointment last week because of the Labor Day holiday on Monday. He could not get another appointment later in the week. Started to feel the wrap  digging in superiorly so we remove the top half and the bottom half of his wrap. He has extensive erythema and blistering superiorly in the left leg. Very tender. Very swollen. Edema in his foot with leaking edema fluid. He has not been systemically unwell 07/22/16; the area on the left leg laterally required some debridement. The medial wounds look more stable. His wrap injury wounds appear to have healed. Edema and his foot is better, weeping edema is also better. He tells me he is meeting with the supplier of the external compression pumps at work 08/05/16; the patient was on vacation last week in [facility]. His wrap is been on for an extended period of time. Also over the weekend he developed an extensive area of tender erythema across his anterior medial thigh. He took to doxycycline yesterday that he had leftover from a previous prescription. The patient complains of weeping edema coming out of his toes 08/08/16; I saw this patient on 10/2. He was tender across his anterior thigh. I put him on doxycycline. He returns today in follow-up. He does not have any open wounds on his lower leg, he still has edema weeping into his toes. 08/12/16; patient was seen back urgently today to  follow-up for his extensive left thigh cellulitis/erysipelas. He comes back with a lot less swelling and erythema pain is much better. I believe I gave him Augmentin and Cipro. His wrap was cut down as he stated a roll down his legs. He developed blistering above the level of the wrap that remained. He has 2 open blisters and 1 intact. 08/19/16; patient is been doing his primary doctor who is increased his Lasix from 40-80 once a day or 80 already has less edema. Cellulitis has remained improved in the left thigh. 2 open areas on the posterior left calf 08/26/16; he returns today having new open blisters on the anterior part of his left leg. He has his compression pumps but is not yet been shown how to use some vital representative from the supplier. 09/02/16 patient returns today with no open wounds on the left leg. Some maceration in his plantar toes 09/10/2016 -- Dr. Leanord Hawking had recently discharged him on 09/02/2016 and he has come right back with redness swelling and some open ulcers on his left lower extremity. He says this was caused by trying to apply his compression stockings and he's been unable to use this and has not been able to use his lymphedema pumps. He had some doxycycline leftover and he has started on this a few days ago. 09/16/16; there are no open wounds on his leg on the left and no evidence of cellulitis. 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 He does not complain of symptoms here. I am not clear use using his external compression pumps. 09/23/16; I have not seen this patient in 2 weeks. He canceled his appointment 10 days ago as he was going on vacation. He tells me that on Monday he noticed a large area on his posterior left leg which is been draining copiously and is reopened into a large wound. He is been using ABDs and the external part of his juxtalite, according to our nurse this was not on properly. 10/07/16;  Still a substantial area on the posterior left leg. Using silver alginate 10/14/16; in general better although there is still open area which looks healthy. Still using silver alginate. He reminds me that this happen before he  left for [facility]. T oday while he was showering in the morning. He had been using his juxtalite's 10/21/16; the area on his posterior left leg is fully epithelialized. However he arrives today with a large area of tender erythema in his medial and posterior left thigh just above the knee. I have marked the area. Once again he is reluctant to consider hospitalization. I treated him with oral antibiotics in the past for a similar situation with resolution I think with doxycycline however this area it seems more extensive to me. He is not complaining of fever but does have chills and says states he is thirsty. His blood sugar today was in the 140s at home 10/25/16 the area on his posterior left leg is fully epithelialized although there is still some weeping edema. The large area of tenderness and erythema in his medial and posterior left thigh is a lot less tender although there is still a lot of swelling in this thigh. He states he feels a lot better. He is on doxycycline and Augmentin that I started last week. This will continued until Tuesday, December 26. I have ordered a duplex ultrasound of the left thigh rule out DVT whether there is an abscess something that would need to be drained I would also like to know. 11/01/16; he still has weeping edema from a not fully epithelialized area on his left posterior calf. Most of the rest of this looks a lot better. He has completed his antibiotics. His thigh is a lot better. Duplex ultrasound did not show a DVT in the thigh 11/08/16; he comes in today with more Denuded surface epithelium from the posterior aspect of his calf. There is no real evidence of cellulitis. The superior aspect of his wrap appears to have put quite an  indentation in his leg just below the knee and this may have contributed. He does not complain of pain or fever. We have been using silver alginate as the primary dressing. The area of cellulitis in the right thigh has totally resolved. He has been using his compression stockings once a week 11/15/16; the patient arrives today with more loss of epithelium from the posterior aspect of his left calf. He now has a fairly substantial wound in this area. The reason behind this deterioration isn't exactly clear although his edema is not well controlled. He states he feels he is generally more swollen systemically. He is not complaining of chest pain shortness of breath fever. T me he has an appointment with his primary physician in early February. He is on 80 mg of oral ells Lasix a day. He claims compliance with the external compression pumps. He is not having any pain in his legs similar to what he has with his recurrent cellulitis 11/22/16; the patient arrives a follow-up of his large area on his left lateral calf. This looks somewhat better today. He came in earlier in the week for a dressing change since I saw him a week ago. He is not complaining of any pain no shortness of breath no chest pain 11/28/16; the patient arrives for follow-up of his large area on the left lateral calf this does not look better. In fact it is larger weeping edema. The surface of the wound does not look too bad. We have been using silver alginate although I'm not certain that this is a dressing issue. 12/05/16; again the patient follows up for a large wound on the left lateral and left posterior calf this does not  look better. There continues to be weeping edema necrotic surface tissue. More worrisome than this once again there is erythema below the wound involving the distal Achilles and heel suggestive of cellulitis. He is on his feet working most of the day of this is not going well. We are changing his dressing twice a week to  facilitate the drainage. 12/12/16; not much change in the overall dimensions of the large area on the left posterior calf. This is very inflamed looking. I gave him an. Doxycycline last week does not really seem to have helped. He found the wrap very painful indeed it seems to of dog into his legs superiorly and perhaps around the heel. He came in early today because the drainage had soaked through his dressings. 12/19/16- patient arrives for follow-up evaluation of his left lower extremity ulcers. He states that he is using his lymphedema pumps once daily when there is "no drainage". He admits to not using his lipedema pumps while under current treatment. His blood sugars have been consistently between 150-200. 12/26/16; the patient is not using his compression pumps at home because of the wetness on his feet. I've advised him that I think it's important for him to use this daily. He finds his feet too wet, he can put a plastic bag over his legs while he is in the pumps. Otherwise I think will be in a vicious circle. We are using silver alginate to the major area on his left posterior calf 01/02/17; the patient's posterior left leg has further of all into 3 open wounds. All of them covered with a necrotic surface. He claims to be using his compression pumps once a day. His edema control is marginal. Continue with silver alginate 01/10/17; the patient's left posterior leg actually looks somewhat better. There is less edema, less erythema. Still has 3 open areas covered with a necrotic surface requiring debridement. He claims to be using his compression pumps once a day his edema control is better 01/17/17; the patient's left posterior calf look better last week when I saw him and his wrap was changed 2 days ago. He has noted increasing pain in the left heel and arrives today with much larger wounds extensive erythema extending down into the entire heel area especially tender medially. He is not  systemically unwell CBGs have been controlled no fever. Our intake nurse showed me limegreen drainage on his AVD pads. 01/24/17; his usual this patient responds nicely to antibiotics last week giving him Levaquin for presumed Pseudomonas. The whole entire posterior part of his leg is much better much less inflamed and in the case of his Achilles heel area much less tender. He has also had some epithelialization posteriorly there are still open areas here and still draining but overall considerably better 01/31/17- He has continue to tolerate the compression wraps. he states that he continues to use the lymphedema pumps daily, and can increase to twice daily on the weekends. He is voicing no complaints or concerns regarding his LLE ulcers 02/07/17-he is here for follow-up evaluation. He states that he noted some erythema to the left medial and anterior thigh, which he states is new as of yesterday. He is concerned about recurrent cellulitis. He states his blood sugars have been slightly elevated, this morning in the 180s 02/14/17; he is here for follow-up evaluation. When he was last here there was erythema superiorly from his posterior wound in his anterior thigh. He was prescribed Levaquin however a culture of the wound surface  grew MRSA over the phone I changed him to doxycycline on Monday and things seem to be a lot better. 02/24/17; patient missed his appointment on Friday therefore we changed his nurse visit into a physician visit today. Still using silver alginate on the large area of the posterior left thigh. He isn't new area on the dorsal left second toe 03/03/17; actually better today although he admits he has not used his external compression pumps in the last 2 days or so because of work responsibilities over the weekend. 03/10/17; continued improvement. External compression pumps once a day almost all of his wounds have closed on the posterior left calf. Better edema control 03/17/17; in general  improved. He still has 3 small open areas on the lateral aspect of his left leg however most of the area on the posterior part of his leg is epithelialized. He has better edema control. He has an ABD pad under his stocking on the right anterior lower leg although he did not let us look at that today. 03/24/17; patient arrives back in clinic today with no open areas however there are areas on the posterior left calf and anterior left calf that are less than 100% epithelialized. His edema is well controlled in the left lower leg. There is some pitting edema probably lymphedema in the left upper thigh. He uses compression pumps at home once per day. I tried to get him to do this twice a day although he is very reticent. 04/01/2017 -- for the last 2 days he's had significant redness, tenderness and weeping and came in for an urgent visit today. 04/07/17; patient still has 6 more days of doxycycline. He was seen by Dr. Meyer Russel last Wednesday for cellulitis involving the posterior aspect, lateral aspect of his Involving his heel. For the most part he is better there is less erythema and less weeping. He has been on his feet for 12 hours 2 over the weekend. Using his compression pumps once a day 04/14/17 arrives today with continued improvement. Only one area on the posterior left calf that is not fully epithelialized. He has intense bilateral venous inflammation associated with his chronic venous insufficiency disease and secondary lymphedema. We have been using silver alginate to the left posterior calf wound In passing he tells [place] today that the right leg but we have not seen in quite some time has an open area on it but he doesn't want [place] to look at this today states he will show this to [place] next week. 04/21/17; there is no open area on his left leg although he still reports some weeping edema. He showed [place] his right leg today which is the first time we've seen this leg in a long time. He has a large area of  open wound on the right leg anteriorly healthy granulation. Quite a bit of swelling in the right leg and some degree of venous inflammation. He told us about the right leg in passing last week but states that deterioration in the right leg really only happened over the weekend 04/28/17; there is no open area on the left leg although there is an irritated part on the posterior which is like a wrap injury. The wound on the right leg which was new from last week at least to [place] is a lot better. 05/05/17; still no open area on the left leg. Patient is using his new compression stocking which seems to be doing a good job of controlling the edema. He states he  is using his compression pumps once per day. The right leg still has an open wound although it is better in terms of surface area. Required debridement. A lot of pain in the posterior right Achilles marked tenderness. Usually this type of presentation this patient gives concern for an active cellulitis 05/12/17; patient arrives today with his major wound from last week on the right lateral leg somewhat better. Still requiring debridement. He was using his compression stocking on the left leg however that is reopened with superficial wounds anteriorly he did not have an open wound on this leg previously. He is still using his juxta light's once daily at night. He cannot find the time to do this in the morning as he has to be at work by 7 AM 05/19/17; right lateral leg wound looks improved. No debridement required. The concerning area is on the left posterior leg which appears to almost have a subcutaneous hemorrhagic component to it. We've been using silver alginate to all the wounds 05/26/17; the right lateral leg wound continues to look improved. However the area on the left posterior calf is a tightly adherent surface. [place] using silver alginate. Because of the weeping edema in his legs there is very little good alternatives. 06/02/17; the patient left  here last week looking quite good. Major wound on the left posterior calf and a small one on the right lateral calf. Both of these look satisfactory. He tells me that by Wednesday he had noted increased pain in the left leg and drainage. He called on Thursday and Friday to get an appointment here but we were blocked. He did not go to urgent care or his primary physician. He thinks he had a fever on Thursday but did not actually take his temperature. He has not been using his compression pumps on the left leg because of pain. I advised him to go to the emergency room today for IV antibiotics for stents of left leg cellulitis but he has refused I have asked him to take 2 days off work to keep his leg elevated and he has refused this as well. In view of this I'm going to call him and Augmentin and doxycycline. 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 However after a detailed discussion regarding the risk benefits and alternatives he refuses to get admitted to the hospital. With no other recourse I will continue him on oral antibiotics as before and hopefully he'll have his infectious disease consultation this week. 06/16/2017 -- the patient was seen today by the nurse practitioner at infectious disease Ms. Dixon. 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 He has also been advise Lotrimin cream locally. she also had wise IV antibiotics for his condition if it flares up 06/23/17; patient arrives today with drainage bilaterally although the remaining wound on the left posterior calf after  cleaning up today "highlighter yellow drainage" did not look too bad. Unfortunately he has had breakdown on the right anterior leg [previously this leg had not been open and he is using a black stocking] he went  to see infectious disease and is been put on clindamycin 150 mg daily, I did not verify the dose although I'm not familiar with using clindamycin in this dosing range, perhaps for prophylaxisoo 06/27/17; I brought this patient back today to follow-up on the wound deterioration on the right lower leg together with surrounding cellulitis. I started him on doxycycline 4 days ago. This area looks better however he comes in today with intense cellulitis on the medial part of his left thigh. This is not have a wound in this area. Extremely tender. We've been using silver alginate to the wounds on the right lower leg left lower leg with bilateral 4 layer compression he is using his external compression pumps once a day 07/04/17; patient's left medial thigh cellulitis looks better. He has not been using his compression pumps as his insert said it was contraindicated with cellulitis. His right leg continues to make improvements all the wounds are still open. We only have one remaining wound on the left posterior calf. Using silver alginate to all open areas. He is on doxycycline which I started a week ago and should be finishing I gave him Augmentin after Thursday's visit for the severe cellulitis on the left medial thigh which fortunately looks better 07/14/17; the patient's left medial thigh cellulitis has resolved. The cellulitis in his right lower calf on the right also looks better. All of his wounds are stable to improved we've been using silver alginate he has completed the antibiotics I have given him. He has clindamycin 150 mg once a day prescribed by infectious disease for prophylaxis, I've advised him to start this now. We have been using bilateral Unna boots over silver alginate to the wound  areas 07/21/17; the patient is been to see infectious disease who noted his recurrent problems with cellulitis. He was not able to tolerate prophylactic clindamycin therefore he is on amoxicillin 500 twice a day. He also had a second daily dose of Lasix added By Dr. Oneta Rack but he is not taking this. Nor is he being completely compliant with his compression pumps a especially not this week. He has 2 remaining wounds one on the right posterior lateral lower leg and one on the left posterior medial lower leg. 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 The patient has Unna boots bilaterally. 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 He has bilateral Unna boots for compression and silver alginate to his wounds. Arrives today with his legs looking as good as I have seen him in quite some time. Not surprisingly his wounds look better as well with improvement on the right lateral leg venous insufficiency wound and also the left medial leg. He is still using the compression pumps once a day 08/11/17; both legs appear to be doing better wounds on the right lateral and left medial legs look better. Skin on the right leg quite good. He is been using silver alginate as the primary dressing. I'm going to use Anasept gel calcium alginate and maintain all the secondary dressings 08/18/17; the patient continues to actually do quite well. The area on his right lateral leg is just about closed the left medial also looks better although it is still moist in this area. His edema is well controlled we have been using Anasept gel  with calcium alginate and the usual secondary dressings, 4 layer compression and once daily use of his compression pumps "always been able to manage 09/01/17; the patient  continues to do reasonably well in spite of his trip to [place]. The area on the right lateral leg is epithelialized. Left is much better but still open. He has more edema and more chronic erythema on the left leg [venous inflammation] 09/08/17; he arrives today with no open wound on the right lateral leg and decently controlled edema. Unfortunately his left leg is not nearly as in his good situation as last week.he apparently had increasing edema starting on Saturday. He edema soaked through into his foot so used a plastic bag to walk around his home. The area on the medial right leg which was his open area is about the same however he has lost surface epithelium on the left lateral which is new and he has significant pain in the Achilles area of the left foot. He is already on amoxicillin chronically for prophylaxis of cellulitis in the left leg 09/15/17; he is completed a week of doxycycline and the cellulitis in the left posterior leg and Achilles area is as usual improved. He still has a lot of edema and fluid soaking through his dressings. There is no open wound on the right leg. He saw infectious disease NP today 09/22/17;As usual 1 we transition him from our compression wraps to his stockings things did not go well. He has several small open areas on the right leg. He states this was caused by the compression wrap on his skin although he did not wear this with the stockings over them. He has several superficial areas on the left leg medially laterally posteriorly. He does not have any evidence of active cellulitis especially involving the left Achilles The patient is traveling from [facility] Saturday going to [facility]. He states he isn't attempting to get an appointment with a heel objects wound center there to change his dressings. I am not completely certain whether this will work 10/06/17; the patient came in on Friday for a nurse visit and the nurse reported that his legs actually look  quite good. He arrives in clinic today for his regular follow-up visit. He has a new wound on his left third toe over the PIP probably caused by friction with his footwear. He has small areas on the left leg and a very superficial but epithelialized area on the right anterior lateral lower leg. Other than that his legs look as good as I've seen him in quite some time. We have been using silver alginate Review of systems; no chest pain no shortness of breath other than this a 10 point review of systems negative 10/20/17; seen by Dr. Meyer Russel last week. He had taken some antibiotics [doxycycline] that he had left over. Dr. Meyer Russel thought he had candida infection and declined to give him further antibiotics. He has a small wound remaining on the right lateral leg several areas on the left leg including a larger area on the left posterior several left medial and anterior and a small wound on the left lateral. The area on the left dorsal third toe looks a lot better. ROS; Gen.; no fever, respiratory no cough no sputum Cardiac no chest pain other than this 10 point review of system is negative 10/30/17; patient arrives today having fallen in the bathtub 3 days ago. It took him a while to get up. He has pain and maceration in the  wounds on his left leg which have deteriorated. He has not been using his pumps he also has some maceration on the right lateral leg. 11/03/17; patient continues to have weeping edema especially in the left leg. This saturates his dressings which were just put on on 12/27. As usual the doxycycline seems to take care of the cellulitis on his lower leg. He is not complaining of fever, chills, or other systemic symptoms. He states his leg feels a lot better on the doxycycline I gave him empirically. He also apparently gets injections at his primary doctor's officeo Rocephin for cellulitis prophylaxis. I didn't ask him about his compression pump compliance today I think that's probably  marginal. Arrives in the clinic with all of his dressings primary and secondary macerated full of fluid and he has bilateral edema 11/10/17; the patient's right leg looks some better although there is still a cluster of wounds on the right lateral. The left leg is inflamed with almost circumferential skin loss medially to laterally although we are still maintaining anteriorly. He does not have overt cellulitis there is a lot of drainage. He is not using compression pumps. We have been using silver alginate to the wound areas, there are not a lot of options here 11/17/17; the patient's right leg continues to be stable although there is still open wounds, better than last week. The inflammation in the left leg is better. Still loss of surface layer epithelium especially posteriorly. There is no overt cellulitis in the amount of edema and his left leg is really quite good, tells me he is using his compression pumps once a day. 11/24/17; patient's right leg has a small superficial wound laterally this continues to improve. The inflammation in the left leg is still improving however we have continuous surface layer epithelial loss posteriorly. There is no overt cellulitis in the amount of edema in both legs is really quite good. He states he is using his compression pumps on the left leg once a day for 5 out of 7 days 12/01/17; very small superficial areas on the right lateral leg continue to improve. Edema control in both legs is better today. He has continued loss of surface epithelialization and left posterior calf although I think this is better. We have been using silver alginate with large number of absorptive secondary dressings 4 layer on the left Unna boot on the right at his request. He tells me he is using his compression pumps once a day 12/08/17; he has no open area on the right leg is edema control is good here. On the left leg however he has marked erythema and tenderness breakdown of skin. He has  what appears to be a wrap injury just distal to the popliteal fossa. 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 There was swelling erythema and breakdown in the right leg. He has much worse skin breakdown in the left leg as well multiple open areas medially and posteriorly as well as laterally. He tells me he has been using his compression pumps but tells me he feels that the drainage out of his leg is worse when he uses a compression pumps. T be fair to him he is been saying this o for a  while however I don't know that I have really been listening to this. I wonder if the compression pumps are working properly 12/22/17;. Once again he arrives with severe erythema, weeping edema from the left greater than right leg. Noncompliance with compression pumps. New this visit he is complaining of pain on the lateral aspect of the right leg and the medial aspect of his right thigh. He apparently saw his cardiologist Dr. [name] who was ordered an echocardiogram area and I think this is a step in the right direction 12/25/17; started his doxycycline Monday night. There is still intense erythema of the right leg especially in the anterior thigh although there is less tenderness. The erythema around the wound on the right lateral calf also is less tender. He still complaining of pain in the left heel. His wounds are about the same right lateral left medial left lateral. Superficial but certainly not close to closure. He denies being systemically unwell no fever chills no abdominal pain no diarrhea 12/29/17; back in follow-up of his extensive right calf and right thigh cellulitis. I added amoxicillin to cover possible doxycycline resistant  strep. This seems to of done the trick he is in much less pain there is much less erythema and swelling. He has his echocardiogram at 11:00 this morning. X-ray of the left heel was also negative. 01/05/18; the patient arrived with his edema under much better control. Now that he is retired he is able to use his compression pumps daily and sometimes twice a day per the patient. He has a wound on the right leg the lateral wound looks better. Area on the left leg also looks a lot better. He has no evidence of cellulitis in his bilateral thighs I had a quick peak at his echocardiogram. He is in normal ejection fraction and normal left ventricular function. He has moderate pulmonary hypertension moderately reduced right ventricular function. One would have to wonder about chronic sleep apnea although he says he doesn't snore. He'll review the echocardiogram with his cardiologist. 01/12/18; the patient arrives with the edema in both legs under exemplary control. He is using his compression pumps daily and sometimes twice daily. His wound on the right lateral leg is just about closed. He still has some weeping areas on the posterior left calf and lateral left calf although everything is just about closed here as well. I have spoken with Aldean Baker who is the patient's nurse practitioner and infectious disease. She was concerned that the patient had not understood that the parenteral penicillin injections he was receiving for cellulitis prophylaxis was actually benefiting him. I don't think the patient actually saw that I would tend to agree we were certainly dealing with less infections although he had a serious one last month. 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 He is healed. He'll be placed in juxtalite compression wraps and increase his lymphedema pumps to twice daily. We will follow up again next week to ensure there are no issues with the new  regiment. 01/20/18-he is here for evaluation of bilateral lower extremity weeping edema. Yesterday he was placed in compression wrap to the right lower extremity and compression stocking to left lower shrubbery. He states he uses lymphedema pumps last night and again this morning and noted a blister to the left lower extremity. On exam he was noted to have drainage to the right lower extremity. He will be placed in Unna boots bilaterally and follow-up next week 01/26/18; patient was  actually discharged a week ago to his own juxta light stockings only to return the next day with bilateral lower extremity weeping edema.he was placed in bilateral Unna boots. He arrives today with pain in the back of his left leg. There is no open area on the right leg however there is a linear/wrap injury on the left leg and weeping edema on the left leg posteriorly. I spoke with infectious disease about 10 days ago. They were disappointed that the patient elected to discontinue prophylactic intramuscular penicillin shots as they felt it was particularly beneficial in reducing the frequency of his cellulitis. I discussed this with the patient today. He does not share this view. He'll definitely need antibiotics today. Finally he is traveling to [place] and trauma leaving this Saturday and returning a week later and he does not travel with his pumps. He is going by car 01/30/18; patient was seen 4 days ago and brought back in today for review of cellulitis in the left leg posteriorly. I put him on amoxicillin this really hasn't helped as much as I might like. He is also worried because he is traveling to [facility] trauma by car. Finally we will be rewrapping him. There is no open area on the right leg over his left leg has multiple weeping areas as usual 02/09/18; The same wrap on for 10 days. He did not pick up the last doxycycline I prescribed for him. He apparently took 4 days worth he already had. There is nothing open on  his right leg and the edema control is really quite good. He's had damage in the left leg medially and laterally especially probably related to the prolonged use of Unna boots 02/12/18; the patient arrived in clinic today for a nurse visit/wrap change. He complained of a lot of pain in the left posterior calf. He is taking doxycycline that I previously prescribed for him. Unfortunately even though he used his stockings and apparently used to compression pumps twice a day he has weeping edema coming out of the lateral part of his right leg. This is coming from the lower anterior lateral skin area. 02/16/18; the patient has finished his doxycycline and will finish the amoxicillin 2 days. The area of cellulitis in the left calf posteriorly has resolved. He is no longer having any pain. He tells me he is using his compression pumps at least once a day sometimes twice. 02/23/18; the patient finished his doxycycline and Amoxil last week. On Friday he noticed a small erythematous circle about the size of a quarter on the left lower leg just above his ankle. This rapidly expanded and he now has erythema on the lateral and posterior part of the thigh. This is bright red. Also has an area on the dorsal foot just above his toes and a tender area just below the left popliteal fossa. He came off his prophylactic penicillin injections at his own insistence one or 2 months ago. This is obviously deteriorated since then 03/02/18; patient is on doxycycline and Amoxil. Culture I did last week of the weeping area on the back of his left calf grew group B strep. I have therefore renewed the amoxicillin 500 3 times a day for a further week. He has not been systemically unwell. Still complaining of an area of discomfort right under his left popliteal fossa. There is no open wound on the right leg. He tells me that he is using his pumps twice a day on most days 03/09/18; patient arrives in  clinic today completing his amoxicillin  today. The cellulitis on his left leg is better. Furthermore he tells me that he had intramuscular penicillin shots that his primary care office today. However he also states that the wrap on his right leg fell down shortly after leaving clinic last week. He developed a large blister that was present when he came in for a nurse visit later in the week and then he developed intense discomfort around this area.He tells me he is using his compression pumps 03/16/18; the patient has completed his doxycycline. The infectious part of this/cellulitis in the left heel area left popliteal area is a lot better. He has 2 open areas on the right calf. Still areas on the left calf but this is a lot better as well. 03/24/18; the patient arrives complaining of pain in the left popliteal area again. He thinks some of this is wrap injury. He has no open area on the right leg and really no open area on the left calf either except for the popliteal area. He claims to be compliant with the compression pumps 03/31/18; I gave him doxycycline last week because of cellulitis in the left popliteal area. This is a lot better although the surface epithelium is denuded off and response to this. He arrives today with uncontrolled edema in the right calf area as well as a fingernail injury in the right lateral calf. There is only a few open areas on the left 04/06/18; I gave him amoxicillin doxycycline over the last 2 weeks that the amoxicillin should be completing currently. He is not complaining of any pain or systemic symptoms. The only open areas see has is on the right lateral lower leg paradoxically I cannot see anything on the left lower leg. He tells me he is using his compression pumps twice a day on most days. Silver alginate to the wounds that are open under 4 layer compression 04/13/18; he completed antibiotics and has no new complaints. Using his compression pumps. Silver alginate that anything that's opened 04/20/18; he is  using his compression pumps religiously. Silver alginate 4 layer compression anything that's opened. He comes in today with no open wounds on the left leg but 3 on the right including a new one posteriorly. He has 2 on the right lateral and one on the right posterior. He likes Unna boots on the right leg for reasons that aren't really clear we had the usual 4 layer compression on the left. It may be necessary to move to the 4 layer compression on the right however for now I left them in the Unna boots 04/27/18; he is using his compression pumps at least once a day. He has still the wounds on the right lateral calf. The area right posteriorly has closed. He does not have an open wound on the left under 4 layer compression however on the dorsal left foot just proximal to the toes and the left third toe 2 small open areas were identified 05/11/18; he has not uses compression pumps. The areas on the right lateral calf have coalesced into one large wound necrotic surface. On the left side he has one small wound anteriorly however the edema is now weeping out of a large part of his left leg. He says he wasn't using his pumps because of the weeping fluid. I explained to him that this is the time he needs to pump more 05/18/18; patient states he is using his compression pumps twice a day. The area on  the right lateral large wound albeit superficial. On the left side he has innumerable number of small new wounds on the left calf particularly laterally but several anteriorly and medially. All these appear to have healthy granulated base these look like the remnants of blisters however they occurred under compression. The patient arrives in clinic today with his legs somewhat better. There is certainly less edema, less multiple open areas on the left calf and the right anterior leg looks somewhat better as well superficial and a little smaller. However he relates pain and erythema over the last 3-4 days in the thigh  and I looked at this today. He has not been systemically unwell no fever no chills no change in blood sugar values 05/25/18; comes in today in a better state. The severe cellulitis on his left leg seems better with the Keflex. Not as tender. He has not been systemically unwell Hard to find an open wound on the left lower leg using his compression pumps twice a day The confluent wounds on his right lateral calf somewhat better looking. These will ultimately need debridement I didn't do this today. 06/01/18; the severe cellulitis on the left anterior thigh has resolved and he is completed his Keflex. There is no open wound on the left leg however there is a superficial excoriation at the base of the third toe dorsally. Skin on the bottom of his left foot is macerated looking. The left the wounds on the lateral right leg actually looks some better although he did require debridement of the top half of this wound area with an open curet 06/09/18 on evaluation today patient appears to be doing poorly in regard to his right lower extremity in particular this appears to likely be infected he has very thick purulent discharge along with a bright green tent to the discharge. This makes me concerned about the possibility of pseudomonas. He's also having increased discomfort at this point on evaluation. Fortunately there does not appear to be any evidence of infection spreading to the other location at this time. 06/16/18 on evaluation today patient appears to actually be doing fairly well. His ulcer has actually diminished in size quite significantly at this point which is good news. Nonetheless he still does have some evidence of infection he did see infectious disease this morning before coming here for his appointment. I did review the results of their evaluation and their note today. They did actually have him discontinue the Cipro and initiate treatment with linezolid at this time. He is doing this for the  next seven days and they recommended a follow-up in four months with them. He is the keep a log of the need for intermittent antibiotic therapy between now and when he falls back up with infectious disease. This will help them gaze what exactly they need to do to try and help them out. 06/23/18; the patient arrives today with no open wounds on the left leg and left third toe healed. He is been using his compression pumps twice a day. On the right lateral leg he still has a sizable wound but this is a lot better than last time I saw this. 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 Has been using silver alginate under 4 layer compression 06/30/18; the only open wound he has is on the right lateral leg and this looks healthy. No debridement is required. We have  been using silver alginate. He does not have an open wound on the left leg. There is apparently some drainage from the dorsal proximal third toe on the left although I see no open wound here. 07/03/18 on evaluation today patient was actually here just for a nurse visit rapid change. However when he was here on Wednesday for his rat change due to having been healed on the left and then developing blisters we initiated the wrap again knowing that he would be back today for [place] to reevaluate and see were at. Unfortunately he has developed some cellulitis into the proximal portion of his right lower extremity even into the region of his thigh. 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 He was placed on one as what at that point. Nonetheless he is done with that and has been tolerating it well otherwise. Doxycycline which in the past really did not seem to be effective for him. Nonetheless I think the best option may be for [place] to definitely reinitiate the antibiotics for a longer period of time. 07/07/18; since I  last saw this patient a week ago he has had a difficult time. At that point he did not have an open wound on his left leg. We transitioned him into juxta light stockings. He was apparently in the clinic the next day with blisters on the left lateral and left medial lower calf. He also had weeping edema fluid. He was put back into a compression wrap. He was also in the clinic on Friday with intense erythema in his right thigh. Per the patient he was started on Bactrim however that didn't work at all in terms of relieving his pain and swelling. He has taken 3 doxycycline that he had left over from last time and that seems to of helped. He has blistering on the right thigh as well. 07/14/18; the erythema on his right thigh has gotten better with doxycycline that he is finishing. 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 Presumably this cellulitis in the thigh was not related to that although I think this is a bit concerning going forward. He still has an area on the right lateral calf the blister on the right medial calf just below the knee that was discussed above. On the left 2 small open areas left medial and left lateral. Edema control is adequate. He is using his compression pumps twice a day 07/20/18; continued improvement in the condition of both legs especially the edema in his bilateral thighs. He tells me he is been losing weight through a combination of diet and exercise. He is using his compression pumps twice a day. So overall she made to the remaining wounds 07/27/2018; continued improvement in condition of both legs. His edema is well controlled. The area on the right lateral leg is just about closed he had one blisters show up on the medial left upper calf. We have him in 4 layer compression. He is going on a 10-day trip to [place], [place] and [place]. He will be driving. He wants to wear Unna boots because of  the lessening amount of constriction. He will not use compression pumps while he is away 08/05/18 on evaluation today patient actually appears to be doing decently well all things considered in regard to his bilateral lower extremities. The worst ulcer is actually only posterior aspect of  his left lower extremity with a four layer compression wrap cut into his leg a couple weeks back. He did have a trip and actually had [employer] for the trip that he is worn since he was last here. Nonetheless he feels like the [employer] actually do better for him his swelling is up a little bit but he also with his trip was not taking his Lasix on a regular set schedule like he was supposed to be. He states that obviously the reason being that he cannot drive and keep going without having to urinate too frequently which makes it difficult. He did not have his pumps with him while he was away either which I think also maybe playing a role here too. 08/13/2018; the patient only has a small open wound on the right lateral calf which is a big improvement in the last month or 2. He also has the area posteriorly just below the posterior fossa on the left which I think was a wrap injury from several weeks ago. He has no current evidence of cellulitis. He tells me he is back into his compression pumps twice a day. He also tells me that while he was at the laundromat somebody stole a section of his extremitease stockings 08/20/2018; back in the clinic with a much improved state. He only has small areas on the right lateral mid calf which is just about healed. This was is more substantial area for quite a prolonged period of time. He has a small open area on the left anterior tibia. The area on the posterior calf just below the popliteal fossa is closed today. He is using his compression pumps twice a day 08/28/2018; patient has no open wound on the right leg. He has a smattering of open areas on the calf with some  weeping lymphedema. More problematically than that it looks as though his wraps of slipped down in his usual he has very angry upper area of edema just below the right medial knee and on the right lateral calf. He has no open area on his feet. The patient is traveling to [facility] next week. I will send him in an antibiotic. We will continue to wrap the right leg. We ordered extremitease stockings for him last week and I plan to transition the right leg to a stocking when he gets home which will be in 10 days time. As usual he is very reluctant to take his pumps with him when he travels 09/07/2018; patient returns from [facility]. He shows me a picture of his left leg in the mid part of his trip last week with intense fire engine erythema. The picture look bad enough I would have considered sending him to the hospital. Instead he went to the wound care center in [facility]. They did not prescribe him antibiotics but he did take some doxycycline he had leftover from a previous visit. I had given him trimethoprim sulfamethoxazole before he left this did not work according to the patient. This is resulted in some improvement fortunately. He comes back with a large wound on the left posterior calf. Smaller area on the left anterior tibia. Denuded blisters on the dorsal left foot over his toes. Does not have much in the way of wounds on the right leg although he does have a very tender area on the right posterior area just below the popliteal fossa also suggestive of infection. He promises me he is back on his  pumps twice a day 09/15/2018; the intense cellulitis in his left lower calf is a lot better. The wound area on the posterior left calf is also so better. However he has reasonably extensive wounds on the dorsal aspect of his second and third toes and the proximal foot just at the base of the toes. There is nothing open on the right leg 09/22/2018; the patient has excellent edema  control in his legs bilaterally. He is using his external compression pumps twice a day. He has no open area on the right leg and only the areas in the left foot dorsally second and third toe area on the left side. He does not have any signs of active cellulitis. 10/06/2018; the patient has good edema control bilaterally. He has no open wound on the right leg. There is a blister in the posterior aspect of his left calf that we had to deal with today. He is using his compression pumps twice a day. There is no signs of active cellulitis. We have been using silver alginate to the wound areas. He still has vulnerable areas on the base of his left first second toes dorsally He has a his extremities stockings and we are going to transition him today into the stocking on the right leg. He is cautioned that he will need to continue to use the compression pumps twice a day. If he notices uncontrolled edema in the right leg he may need to go to 3 times a day. 10/13/2018; the patient came in for a nurse check on Friday he has a large flaccid blister on the right medial calf just below the knee. We unroofed this. He has this and a new area underneath the posterior mid calf which was undoubtedly a blister as well. He also has several small areas on the right which is the area we put his extremities stocking on. 10/19/2018; the patient went to see infectious disease this morning I am not sure if that was a routine follow-up in any case the doxycycline I had given him was discontinued and started on linezolid. He has not started this. It is easy to look at his left calf and the inflammation and think this is cellulitis however he is very tender in the tissue just below the popliteal fossa and I have no doubt that there is infection going on here. He states the problem he is having is that with the compression pumps the edema goes down and then starts walking the wrap falls down. We will see if we can adhere this. He  has 1 or 2 minuscule open areas on the right still areas that are weeping on the posterior left calf, the base of his left second and third toes 10/26/18; back today in clinic with quite of skin breakdown in his left anterior leg. This may have been infection the area below the popliteal fossa seems a lot better however tremendous epithelial loss on the left anterior mid tibia area over quite inexpensive tissue. He has 2 blisters on the right side but no other open wound here. 10/29/2018; came in urgently to see [place] today and we worked him in for review. He states that the 4 layer compression on the right leg caused pain he had to cut it down to roughly his mid calf this caused swelling above the wrap and he has blisters and skin breakdown today. As a result of the pain he has not been using his pumps. Both legs are a lot more  edematous and there is a lot of weeping fluid. 11/02/18; arrives in clinic with continued difficulties in the right leg> left. Leg is swollen and painful. multiple skin blisters and new open areas especially laterally. He has not been using his pumps on the right leg. 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 He is not systemically unwell. 11/09/2018; the patient claims he is being compliant with his pumps. He is finished the doxycycline I gave him last week. Culture I did of the wound on the right lateral leg showed a few very resistant methicillin staph aureus. This was resistant to doxycycline. Nevertheless he states the pain in the leg is a lot better which makes me wonder if the cultured organism was not really what was causing the problem nevertheless this is a very dangerous organism to be culturing out of any wound. His right leg is still a lot larger than the left. He is using an Radio broadcast assistant on this area, he blames a 4-layer compression for causing the original skin breakdown which I doubt is true however I cannot talk him out  of it. We have been using silver alginate to all of these areas which were initially blisters 11/16/2018; patient is being compliant with his external compression pumps at twice a day. Miraculously he arrives in clinic today with absolutely no open wounds. He has better edema control on the left where he has been using 4 layer compression versus wound of wounds on the right and I pointed this out to him. There is no inflammation in the skin in his lower legs which is also somewhat unusual for him. There is no open wounds on the dorsal left foot. He has extremitease stockings at home and I have asked him to bring these in next week. 11/25/18 patient's lower extremity on examination today on the left appears for the most part to be wound free. He does have an open wound on the lateral aspect of the right lower extremity but this is minimal compared to what I've seen in past. He does request that we go ahead and wrap the left leg as well even though there's nothing open just so hopefully it will not reopen in short order. 1/28; patient has superficial open wounds on the right lateral calf left anterior calf and left posterior calf. His edema control is adequate. 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 We have been using silver alginate as the primary dressing. He claims compliance with his compression pumps 2/4; patient has superficial open wounds on numerous areas of his left calf and again one on the left dorsal foot. The areas on the right lateral calf have healed. The cellulitis that I gave him doxycycline for last week is also resolved this was mostly on the left anterior calf just below the tibial tuberosity. His edema looks fairly well-controlled. He tells me he went to see his primary doctor today and had blood work ordered 2/11; once again he has several open areas on the left calf left tibial area. Most of  these are small and appear to have healthy granulation. He does not have anything open on the right. The edema and control in his thighs is pretty good which is usually a good indication he has been using his pumps as requested. 2/18; he continues to have several small areas on the left calf and left tibial  area. Most of these are small healthy granulation. We put him in his stocking on the right leg last week and he arrives with a superficial open area over the right upper tibia and a fairly large area on the right lateral tibia in similar condition. His edema control actually does not look too bad, he claims to be using his compression pumps twice a day 2/25. Continued small areas on the left calf and left tibial area. New areas especially on the right are identified just below the tibial tuberosity and on the right upper tibia itself. There are also areas of weeping edema fluid even without an obvious wound. He does not have a considerable degree of lymphedema but clearly there is more edema here than his skin can handle. He states he is using the pumps twice a day. We have an Unna boot on the right and 4 layer compression on the left. 3/3; he continues to have an area on the right lateral calf and right posterior calf just below the popliteal fossa. There is a fair amount of tenderness around the wound on the popliteal fossa but I did not see any evidence of cellulitis, could just be that the wrap came down and rubbed in this area. He does not have an open area on the left leg however there is an area on the left dorsal foot at the base of the third toe We have been using silver alginate to all wound areas 3/10; he did not have an open area on his left leg last time he was here a week ago. T oday he arrives with a horizontal wound just below the tibial tuberosity and an area on the left lateral calf. He has intense erythema and tenderness in this area. The area is on the right lateral calf and  right posterior calf better than last week. We have been using silver alginate as usual 3/18 - Patient returns with 3 small open areas on left calf, and 1 small open area on right calf, the skin looks ok with no significant erythema, he continues the UNA boot on right and 4 layer compression on left. The right lateral calf wound is closed , the right posterior is small area. we will continue silver alginate to the areas. 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 He is been tolerating the dressing changes without complication. Fortunately she has made excellent improvement in regard to the overall status of his wounds. Unfortunately every time we cease wrapping him he ends up reopening in causing more significant issues at that point. Again I'm unsure of the best direction to take although I think the lymphedema clinic may be appropriate for him. 02/03/19 on evaluation today patient appears to be doing well in regard to the wounds that we saw him for last week unfortunately he has a new area on the proximal portion of his right medial/posterior lower extremity where the wrap somewhat slowed down and caused swelling and a blister to rub and open. Unfortunately this is the only opening that he has on either leg at this point. 02/17/19 on evaluation today patient's bilateral lower extremities appear to be doing well. He still completely healed in regard to the left lower extremity. In regard to the right lower extremity the area where the wrap and slid down  and caused the blister still seems to be slightly open although this is dramatically better than during the last evaluation two weeks ago. I'm very pleased with the way this stands overall. 03/03/19 on evaluation today patient appears to be doing well in regard  to his right lower extremity in general although he did have a new blister open this does not appear to be showing any evidence of active infection at this time. Fortunately there's No fevers, chills, nausea, or vomiting noted at this time. Overall I feel like he is making good progress it does feel like that the right leg will we perform the [employer] seems to do with a bit better than three layer wrap on the left which slid down on him. We may switch to doing bilateral in the book wraps. 5/4; I have not seen Mr. Goodenow in quite some time. According to our case manager he did not have an open wound on his left leg last week. He had 1 remaining wound on the right posterior medial calf. He arrives today with multiple openings on the left leg probably were blisters and/or wrap injuries from Unna boots. I do not think the Unna boot's will provide adequate compression on the left. I am also not clear about the frequency he is using the compression pumps. 03/17/19 on evaluation today patient appears to be doing excellent in regard to his lower extremities compared to last week's evaluation apparently. He had gotten significantly worse last week which is unfortunate. The [employer] wrap on the left did not seem to do very well for him at all and in fact it didn't control his swelling significantly enough he had an additional outbreak. Subsequently we go back to the four layer compression wrap on the left. This is good news. At least in that he is doing better and the wound seem to be killing him. He still has not heard anything from the lymphedema clinic. 03/24/19 on evaluation today patient actually appears to be doing much better in regard to his bilateral lower Trinity as compared to last week when I saw him. Fortunately there's no signs of active infection at this time. He has been tolerating the dressing changes without complication. Overall I'm extremely pleased with the progress and appearance in  general. 04/07/19 on evaluation today patient appears to be doing well in regard to his bilateral lower extremities. His swelling is significantly down from where it was previous. With that being said he does have a couple blisters still open at this point but fortunately nothing that seems to be too severe and again the majority of the larger openings has healed at this time. 04/14/19 on evaluation today patient actually appears to be doing quite well in regard to his bilateral lower extremities in fact I'm not even sure there's anything significantly open at this time at any site. Nonetheless he did have some trouble with these wraps where they are somewhat irritating him secondary to the fact that he has noted that the graph wasn't too close down to the end of this foot in a little bit short as well up to his knee. Otherwise things seem to be doing quite well. 04/21/19 upon evaluation today patient's wound bed actually showed evidence of being completely healed in regard to both lower extremities which is excellent news. There does not appear to be any signs of active infection which is also good news. I'm very pleased in this regard. No fevers, chills, nausea, or vomiting noted  at this time. 04/28/19 on evaluation today patient appears to be doing a little bit worse in regard to both lower extremities on the left mainly due to the fact that when he went infection disease the wrap was not wrapped quite high enough he developed a blister above this. On the right he is a small open area of nothing too significant but again this is continuing to give him some trouble he has been were in the Velcro compression that he has at home. 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 He's been tolerating the dressing changes without complication. Fortunately there's no signs of active infection at this time. No fevers, chills, nausea, or vomiting noted at  this time. We have been trying to get an appointment with her lymphedema clinic in [facility] but unfortunately nobody can get them on phone with not been able to even fax information over the patient likewise is not been able to get in touch with them. Overall I'm not sure exactly what's going on here with to reach out again today. 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 Still having a lot of drainage unfortunately. He tells me especially in the left but even on the right. There's no signs of active infection which is good news we've been using so ratcheted up to this point. 05/19/19 on evaluation today patient actually appears to be doing quite well with regard to his left lower extremity which is great news. Fortunately in regard to the right lower extremity has an issues with his wrap and he subsequently did remove this from what I'm understanding. Nonetheless long story short is what he had rewrapped once he removed it subsequently had maggots underneath this wrap whenever he came in for evaluation today. With that being said they were obviously completely cleaned away by the nursing staff. The visit today which is excellent news. However he does appear to potentially have some infection around the right ankle region where the maggots were located as well. He will likely require anabiotic therapy today. 05/26/19 on evaluation today patient actually appears to be doing much better in regard to his bilateral lower extremities. I feel like the infection is under much better control. With that being said there were maggots noted when the wrap was removed yet again today. Again this could have potentially been left over from previous although at this time there does not appear to be any signs of significant drainage there was obviously on the wrap some drainage as well this contracted gnats or  otherwise. Either way I do not see anything that appears to be doing worse in my pinion and in fact I think his drainage has slowed down quite significantly likely mainly due to the fact to his infection being under better control. 06/02/2019 on evaluation today patient actually appears to be doing well with regard to his bilateral lower extremities there is no signs of active infection at this time which is great news. With that being said he does have several open areas more so on the right than the left but nonetheless these are all significantly better than previously noted. 06/09/2019 on evaluation today patient actually appears to be doing well. His wrap stayed up and he did not cause any problems he had more drainage on the right compared to the left but overall I do not see  any major issues at this time which is great news. 06/16/2019 on evaluation today patient appears to be doing excellent with regard to his lower extremities the only area that is open is a new blister that can have opened as of today on the medial ankle on the left. Other than this he really seems to be doing great I see no major issues at this point. 06/23/2019 on evaluation today patient appears to be doing quite well with regard to his bilateral lower extremities. In fact he actually appears to be almost completely healed there is a small area of weeping noted of the right lower extremity just above the ankle. Nonetheless fortunately there is no signs of active infection at this time which is good news. No fevers, chills, nausea, vomiting, or diarrhea. 8/24; the patient arrived for a nurse visit today but complained of very significant pain in the left leg and therefore I was asked to look at this. Noted that he did not have an open area on the left leg last week nevertheless this was wrapped. The patient states that he is not been able to put his compression pumps on the left leg because of the discomfort. He has not been  systemically unwell 06/30/2019 on evaluation today patient unfortunately despite being excellent last week is doing much worse with regard to his left lower extremity today. In fact he had to come in for a nurse on Monday where his left leg had to be rewrapped due to excessive weeping Dr. Leanord Hawking placed him on doxycycline at that point. Fortunately there is no signs of active infection Systemically at this time which is good news. 07/07/2019 in regard to the patient's wounds today he actually seems to be doing well with his right lower extremity there really is nothing open or draining at this point this is great news. Unfortunately the left lower extremity is given him additional trouble at this time. There does not appear to be any signs of active infection nonetheless he does have a lot of edema and swelling noted at this point as well as blistering all of which has led to a much more poor appearing leg at this time compared to where it was 2 weeks ago when it was almost completely healed. Obviously this is a little discouraging for the patient. He is try to contact the lymphedema clinic in [place] he has not been able to get through to them. 07/14/2019 on evaluation today patient actually appears to be doing slightly better with regard to his left lower extremity ulcers. Overall I do feel like at least at the top of the wrap that we have been placing this area has healed quite nicely and looks much better. The remainder of the leg is showing signs of improvement. Unfortunately in the thigh area he still has an open region on the left and again on the right he has been utilizing just a Band-Aid on an area that also opened on the thigh. Again this is an area that were not able to wrap although we did do an Ace wrap to provide some compression that something that obviously is a little less effective than the compression wraps we have been using on the lower portion of the leg. He does have an  appointment with the lymphedema clinic in [facility] on Friday. 07/21/2019 on evaluation today patient appears to be doing better with regard to his lower extremity ulcers. He has been tolerating the dressing changes without complication. Fortunately there is  no signs of active infection at this time. No fevers, chills, nausea, vomiting, or diarrhea. I did receive the paperwork from the physical therapist at the lymphedema clinic in [place]. Subsequently I signed off on that this morning and sent that back to him for further progression with the treatment plan. 07/28/2019 on evaluation today patient appears to be doing very well with regard to his right lower extremity where I do not see any open wounds at this point. Fortunately he is feeling great as far as that is concerned as well. In regard to the left lower extremity he has been having issues with still several areas of weeping and edema although the upper leg is doing better his lower leg still I think is going require the compression wrap at this time. No fevers, chills, nausea, vomiting, or diarrhea. 08/04/2019 on evaluation today patient unfortunately is having new wounds on the right lower extremity. Again we have been using Unna boot wrap on that side. We switched him to using his juxta lite wrap at home. With that being said he tells me he has been using it although his legs extremely swollen and to be honest really does not appear that he has been. I cannot know that for sure however. Nonetheless he has multiple new wounds on the right lower extremity at this time. Obviously we will have to see about getting this rewrapped for him today. 08/11/2019 on evaluation today patient appears to be doing fairly well with regard to his wounds. He has been tolerating the dressing changes including the compression wraps without complication. He still has a lot of edema in his upper thigh regions bilaterally he is supposed to be seeing the  lymphedema clinic on the 15th of this month once his wraps arrive for the upper part of his legs. 08/18/2019 on evaluation today patient appears to be doing well with regard to his bilateral lower extremities at this point. He has been tolerating the dressing changes without complication. Fortunately there is no signs of active infection which is also good news. He does have a couple weeping areas on the first and second toe of the right foot he also has just a small area on the left foot upper leg and a small area on the left lower leg but overall he is doing quite well in my opinion. He is supposed to be getting his wraps shortly in fact tomorrow and then subsequently is seeing the lymphedema clinic next Wednesday on the 21st. Of note he is also leaving on the 25th to go on vacation for a week to the beach. For that reason and since there is some uncertainty about what there can be doing at lymphedema clinic next Wednesday I am get a make an appointment for next Friday here for [place] to see what we need to do for him prior to him leaving for vacation. 10/23; patient arrives in considerable pain predominantly in the upper posterior calf just distal to the popliteal fossa also in the wound anteriorly above the major wound. This is probably cellulitis and he has had this recurrently in the past. He has no open wound on the right side and he has had an Radio broadcast assistant in that area. Finally I note that he has an area on the left posterior calf which by enlarge is mostly epithelialized. This protrudes beyond the borders of the surrounding skin in the setting of dry scaly skin and lymphedema. The patient is leaving for [place] on Sunday. Per  his longstanding pattern, he will not take his compression pumps with him predominantly out of fear that they will be stolen. He therefore asked that we put a Unna boot back on the right leg. He will also contact the wound care center in [facility] to see if they can  change his dressing in the mid week. 11/3; patient returned from his vacation to [facility]. He was seen on 1 occasion at their wound care center. They did a 2 layer compression system as they did not have our 4-layer wrap. I am not completely certain what they put on the wounds. They did not change the Unna boot on the right. The patient is also seeing a lymphedema specialist physical therapist in [place]. It appears that he has some compression sleeve for his thighs which indeed look quite a bit better than I am used to seeing. He pumps over these with his external compression pumps. 11/10; the patient has a new wound on the right medial thigh otherwise there is no open areas on the right. He has an area on the left leg posteriorly anteriorly and medially and an area over the left second toe. We have been using silver alginate. He thinks the injury on his thigh is secondary to friction from the compression sleeve he has. 11/17; the patient has a new wound on the right medial thigh last week. He thinks this is because he did not have a underlying stocking for his thigh juxta lite apparatus. He now has this. The area is fairly large and somewhat angry but I do not think he has underlying cellulitis. He has a intact blister on the right anterior tibial area. Small wound on the right great toe dorsally Small area on the medial left calf. 11/30; the patient does not have any open areas on his right leg and we did not take his juxta lite stocking off. However he states that on Friday his compression wrap fell down lodging around his upper mid calf area. As usual this creates a lot of problems for him. He called urgently today to be seen for a nurse visit however the nurse visit turned into a provider visit because of extreme erythema and pain in the left anterior tibia extending laterally and posteriorly. The area that is problematic is extensive 10/06/2019 upon evaluation today patient actually  appears to be doing poorly in regard to his left lower extremity. He Dr. Leanord Hawking did place him on doxycycline this past Monday apparently due to the fact that he was doing much worse in regard to this left leg. Fortunately the doxycycline does seem to be helping. Unfortunately we are still having a very difficult time getting his edema under any type of control in order to anticipate discharge at some point. The only way were really able to control his lymphedema really is with compression wraps and that has only even seemingly temporary. He has been seeing a lymphedema clinic they are trying to help in this regard but still this has been somewhat frustrating in general for the patient. 10/13/19 on evaluation today patient appears to be doing excellent with regard to his right lower extremity as far as the wounds are concerned. His swelling is still quite extensive unfortunately. He is still having a lot of drainage from the thigh areas bilaterally which is unfortunate. He's been going to lymphedema clinic but again he still really does not have this edema under control as far as his lower extremities are concern. With regard  to his left lower extremity this seems to be improving and I do believe the doxycycline has been of benefit for him. He is about to complete the doxycycline. 10/20/2019 on evaluation today patient appears to be doing poorly in regard to his bilateral lower extremities. More in the right thigh he has a lot of irritation at this site unfortunately. In regard to the left lower extremity the wrap was not quite as high it appears and does seem to have caused him some trouble as well. Fortunately there is no evidence of systemic infection though he does have some blue-green drainage which has me concerned for the possibility of Pseudomonas. He tells me he is previously taking Cipro without complications and he really does not care for Levaquin however due to some of the side effects he  has. He is not allergic to any medications specifically antibiotics that were aware of. 10/27/2019 on evaluation today patient actually does appear to be for the most part doing better when compared to last week's evaluation. With that being said he still has multiple open wounds over the bilateral lower extremities. He actually forgot to start taking the Cipro and states that he still has the whole bottle. He does have several new blisters on left lower extremity today I think I would recommend he go ahead and take the Cipro based on what I am seeing at this point. 12/30-Patient comes at 1 week visit, 4 layer compression wraps on the left and Unna boot on the right, primary dressing Xtrasorb and silver alginate. Patient is taking his Cipro and has a few more days left probably 5-6, and the legs are doing better. He states he is using his compressions devices which I believe he has 11/10/2019 on evaluation today patient actually appears to be much better than last time I saw him 2 weeks ago. His wounds are significantly improved and overall I am very pleased in this regard. Fortunately there is no signs of active infection at this time. He is just a couple of days away from completing Cipro. Overall his edema is much better he has been using his lymphedema pumps which I think is also helping at this point. 11/17/2019 on evaluation today patient appears to be doing excellent in regard to his wounds in general. His legs are swollen but not nearly as much as they have been in the past. Fortunately he is tolerating the compression wraps without complication. No fevers, chills, nausea, vomiting, or diarrhea. He does have some erythema however in the distal portion of his right lower extremity specifically around the forefoot and toes there is a little bit of warmth here as well. 11/24/2019 on evaluation today patient appears to be doing well with regard to his right lower extremity I really do not see any open  wounds at this point. His left lower extremity does have several open areas and his right medial thigh also is open. Other than this however overall the patient seems to be making good progress and I am very pleased at this point. 12/01/2019 on evaluation today patient appears to be doing poorly at this point in regard to his left lower extremity has several new blisters despite the fact that we have him in compression wraps. In fact he had a 4-layer compression wrap, his upper thigh wrapped from lymphedema clinic, and a juxta light over top of the 4 layer compression wrap the lymphedema clinic applied and despite all this he still develop blisters underneath. Obviously this does  have me concerned about the fact that unfortunately despite what we are doing to try to get wounds healed he continues to have new areas arise I do not think he is ever good to be at the point where he can realistically just use wraps at home to keep things under control. Typically when we heal him it takes about 1-2 days before he is back in the clinic with severe breakdown and blistering of his lower extremities bilaterally. This is happened numerous times in the past. Unfortunately I think that we may need some help as far as overall fluid overload to kind of limit what we are seeing and get things under better control. 12/08/2019 on evaluation today patient presents for follow-up concerning his ongoing bilateral lower extremity edema. Unfortunately he is still having quite a bit of swelling the compression wraps are controlling this to some degree but he did see Dr. Debara Pickett his cardiologist I do have that available for review today as far as the appointment was concerned that was on 12/06/2019. Obviously that she has been 2 days ago. The patient states that he is only been taking the Lasix 80 mg 1 time a day he had told me previously he was taking this twice a day. Nonetheless Dr. Debara Pickett recommended this be up to 80 mg 2 times a  day for the patient as he did appear to be fluid overloaded. With that being said the patient states he did this yesterday and he was unable to go anywhere or do anything due to the fact that he was constantly having to urinate. Nonetheless I think that this is still good to be something that is important for him as far as trying to get his edema under control at all things that he is going to be able to just expect his wounds to get under control and things to be better without going through at least a period of time where he is trying to stabilize his fluid management in general and I think increasing the Lasix is likely the first step here. It was also mentioned the possibility that the patient may require metolazone. With that being said he wanted to have the patient take Lasix twice a day first and then reevaluating 2 months to see where things stand. 12/15/2019 upon evaluation today patient appears to be doing regard to his legs although his toes are showing some signs of weeping especially on the left at this point to some degree on the right. There does not appear to be any signs of active infection and overall I do feel like the compression wraps are doing well for him but he has not been able to take the Lasix at home and the increased dose that Dr. Debara Pickett recommended. He tells me that just not go to be feasible for him. Nonetheless I think in this case he should probably send a message to Dr. Debara Pickett in order to discuss options from the standpoint of possible admission to get the fluid off or otherwise going forward. 12/22/2019 upon evaluation today patient appears to be doing fairly well with regard to his lower extremities at this point. In fact he would be doing excellent if it was not for the fact that his right anterior thigh apparently had an allergic reaction to adhesive tape that he used. The wound itself that we have been monitoring actually appears to be healed. There is a lot of  irritation at this point. 12/29/2019 upon evaluation today patient appears to  be doing well in regard to his lower extremities. His left medial thigh is open and somewhat draining today but this is the only region that is open the right has done much better with the treatment utilizing the steroid cream that I prescribed for him last week. Overall I am pleased in that regard. Fortunately there is no signs of active infection at this time. No fevers, chills, nausea, vomiting, or diarrhea. 01/05/2020 upon evaluation today patient appears to be doing more poorly in regard to his right lower extremity at this point upon evaluation today. Unfortunately he continues to have issues in this regard and I think the biggest issue is controlling his edema. This obviously is not very well controlled at this point is been recommended that he use the Lasix twice a day but he has not been able to do that. Unfortunately I think this is leading to an issue where honestly he is not really able to effectively control his edema and therefore the wounds really are not doing significantly better. I do not think that he is going to be able to keep things under good control unless he is able to control his edema much better. I discussed this again in great detail with him today. 01/12/2020 good news is patient actually appears to be doing quite well today at this point. He does have an appointment with lymphedema clinic tomorrow. His legs appear healed and the toe on the left is almost completely healed. In general I am very pleased with how things stand at this point. 01/19/2020 upon evaluation today patient appears to actually be doing well in regard to his lower extremities there is nothing open at this point. Fortunately he has done extremely well more recently. Has been seeing lymphedema clinic as well. With that being said he has Velcro wraps for his lower legs as well as his upper legs. The only wound really is on his toe  which is the right great toe and this is barely anything even there. With all that being said I think it is good to be appropriate today to go ahead and switch him over to the Velcro compression wraps. 01/26/2020 upon evaluation today patient appears to be doing worse with regard to his lower extremities after last week switch him to Velcro compression wraps. Unfortunately he lasted less than 24 hours he did not have the sock portion of his Velcro wrap on the left leg and subsequently developed a blister underneath the Velcro portion. Obviously this is not good and not what we were looking for at this point. He states the lymphedema clinic did tell him to wear the wrap for 23 hours and take him off for 1 I am okay with that plan but again right now we got a get things back under control again he may have some cellulitis noted as well. 02/02/2020 upon evaluation today patient unfortunately appears to have several areas of blistering on his bilateral lower extremities today mainly on the feet. His legs do seem to be doing somewhat better which is good news. Fortunately there is no evidence of active infection at this time. No fevers, chills, nausea, vomiting, or diarrhea. 02/16/2020 upon evaluation today patient appears to be doing well at this time with regard to his legs. He has a couple weeping areas on his toes but for the most part everything is doing better and does appear to be sealed up on his legs which is excellent news. We can continue with wrapping him  at this point as he had every time we discontinue the wraps he just breaks out with new wounds. There is really no point in is going forward with this at this point. 03/08/2020 upon evaluation today patient actually appears to be doing quite well with regard to his lower extremity ulcers. He has just a very superficial and really almost nonexistent blister on the left lower extremity he has in general done very well with the compression wraps. With  that being said I do not see any signs of infection at this time which is good news. 03/29/2020 upon evaluation today patient appears to be doing well with regard to his wounds currently except for where he had several new areas that opened up due to some of the wrap slipping and causing him trouble. He states he did not realize they had slipped. Nonetheless he has a 1 area on the right and 3 new areas on the left. Fortunately there is no signs of active infection at this time which is great news. 04/05/2020 upon evaluation today patient actually appears to be doing quite well in general in regard to his legs currently. Fortunately there is no signs of active infection at this time. No fevers, chills, nausea, vomiting, or diarrhea. He tells me next week that he will actually be seen in the lymphedema clinic on Thursday at 10 AM I see him on Wednesday next week. 04/12/2020 upon evaluation today patient appears to be doing very well with regard to his lower extremities bilaterally. In fact he does not appear to have any open wounds at this point which is good news. Fortunately there is no signs of active infection at this time. No fevers, chills, nausea, vomiting, or diarrhea. 04/19/2020 upon evaluation today patient appears to be doing well with regard to his wounds currently on the bilateral lower extremities. There does not appear to be any signs of active infection at this time. Fortunately there is no evidence of systemic infection and overall very pleased at this point. Nonetheless after I held him out last week he literally had blisters the next morning already which swelled up with him being right back here in the clinic. Overall I think that he is just not can be able to be discharged with his legs the way they are he is much to volume overloaded as far as fluid is concerned and that was discussed with him today of also discussed this but should try the clinic nurse manager as well as Dr.  Leanord Hawking. 04/26/2020 upon evaluation today patient appears to be doing better with regard to his wounds currently. He is making some progress and overall swelling is under good control with the compression wraps. Fortunately there is no evidence of active infection at this time. 05/10/2020 on evaluation today patient appears to be doing overall well in regard to his lower extremities bilaterally. He is Tolerating the compression wraps without complication and with what we are seeing currently I feel like that he is making excellent progress. There is no signs of active infection at this time. Electronic Signature(s) Signed: 05/10/2020 11:20:50 AM By: Lenda Kelp PA-C Entered By: Lenda Kelp on 05/10/2020 11:20:49 -------------------------------------------------------------------------------- Physical Exam Details Patient Name: Date of Service: [name], Bobbie J. 05/10/2020 10:30 A M Medical Record Number: [id number] Patient Account Number: [id number] Date of Birth/Sex: Treating RN: 24-Jul-1951 (69 y.o. [name] Primary Care Provider: Marney Setting, [name] Other Clinician: Referring Provider: Treating Provider/Extender: Ara Kussmaul [name], [name] Tania Ade  in Treatment: 224 Constitutional Obese and well-hydrated in no acute distress. Respiratory normal breathing without difficulty. Psychiatric this patient is able to make decisions and demonstrates good insight into disease process. Alert and Oriented x 3. pleasant and cooperative. Notes Upon inspection patient's wound bed actually showed signs of good epithelial tissue over the majority of the wounds and some areas appear to be healed overall I am extremely pleased with where things stand and he has very little weeping overall that is good. He tells me he has taken the Lasix and is trying elevate as much as he can he is not exercising regularly however we discussed that as well. Electronic Signature(s) Signed: 05/10/2020 11:21:16 AM  By: Lenda Kelp PA-C Entered By: Lenda Kelp on 05/10/2020 11:21:15 -------------------------------------------------------------------------------- Physician Orders Details Patient Name: Date of Service: [name], Leshon J. 05/10/2020 10:30 A M Medical Record Number: [id number] Patient Account Number: [id number] Date of Birth/Sex: Treating RN: June 23, 1951 (69 y.o. 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 - with Leonard Schwartz Return A Nurse Visit: - 1 week Dressing Change Frequency Do not change entire dressing for one week. - both legs Skin Barriers/Peri-Wound Care Moisturizing lotion - both legs Wound Cleansing May shower with protection. Primary Wound Dressing Wound #174 Left,Dorsal Foot Calcium Alginate with Silver Wound #175 Right,Posterior Lower Leg Calcium Alginate with Silver Secondary Dressing Wound #174 Left,Dorsal Foot Dry Gauze ABD pad - as needed Wound #175 Right,Posterior Lower Leg Dry Gauze ABD pad - as needed Edema Control 4 layer compression: Left lower extremity Unna Boot to Right Lower Extremity Avoid standing for long periods of time Elevate legs to the level of the heart or above for 30 minutes daily and/or when sitting, a frequency of: -  throughout the day Exercise regularly Electronic Signature(s) Signed: 05/10/2020 5:11:14 PM By: Lenda Kelp PA-C Signed: 05/10/2020 5:28:41 PM By: Zenaida Deed RN, BSN Entered By: Zenaida Deed on 05/10/2020 10:50:35 -------------------------------------------------------------------------------- Problem List Details Patient Name: Date of Service: [name], Ronav J. 05/10/2020 10:30 A M Medical Record Number: [id number] Patient Account Number: [id number] Date of Birth/Sex: Treating RN: 09/20/1951 (69 y.o. [name] Primary Care Provider: Marney Setting, [name] Other Clinician: Referring Provider: Treating Provider/Extender: Suzy Bouchard, [name] in Treatment: 224 Active Problems ICD-10 Encounter Code Description Active Date MDM Diagnosis 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 skin 06/30/2018 No Yes L97.221 Non-pressure chronic ulcer of left calf limited to breakdown of skin 09/30/2016 No Yes 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 of 01/22/2016 No Yes bilateral lower extremity I89.0 Lymphedema, not elsewhere classified 01/22/2016 No Yes E11.622 Type 2 diabetes mellitus with other skin ulcer 01/22/2016 No Yes 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, unspecified 01/22/2016 No Yes L03.116 Cellulitis of left lower limb 04/01/2017 No Yes Inactive Problems ICD-10 Code Description Active Date Inactive Date L97.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  right calf limited to breakdown of skin 06/30/2017 06/30/2017 L97.521 Non-pressure chronic ulcer of other 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 of skin 04/27/2018 04/27/2018 L03.115 Cellulitis of right lower limb 12/22/2017 12/22/2017 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 06/30/2018 06/30/2018 L97.511 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 of skin 06/30/2018  06/30/2018 Resolved Problems Electronic Signature(s) Signed: 05/10/2020 10:43:20 AM By: Lenda Kelp PA-C Signed: 05/10/2020 10:43:20 AM By: Lenda Kelp PA-C Entered By: Lenda Kelp on 05/10/2020 10:43:17 -------------------------------------------------------------------------------- Progress Note Details Patient Name: Date of Service: [name], Daniyal J. 05/10/2020 10:30 A M Medical Record Number: [id number] Patient Account Number: [id number] Date of Birth/Sex: Treating RN: 03-07-1951 (69 y.o. [name] Primary Care Provider: Marney Setting, [name] Other Clinician: Referring Provider: Treating Provider/Extender: Lenda Kelp MCGO [name], [name] in Treatment: 224 Subjective Chief Complaint Information obtained from Patient patient is here for evaluation venous/lymphedema weeping History of Present Illness (HPI) Referred by PCP for consultation. Patient has long standing history of BLE venous stasis, no prior ulcerations. At beginning of month, developed cellulitis and weeping. Received IM Rocephin followed by Keflex and resolved. Wears compression stocking, appr 6 months old. Not sure strength. No present drainage. 01/22/16 this is a patient who is a type II diabetic on insulin. 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 He tells me he religiously wears pressure stockings of uncertain strength. He was here with weeping edema about 8 months ago but did not have an open wound. Roughly a month ago he had a reopening on his bilateral legs. He is been using bandages and Neosporin. He does not complain of pain. 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 Last BUN/creatinine I have is from 11/20/15 at 13 and 1.0 respectively 01/29/16; patient arrives today having tolerated the Profore wrap. He brought in his stockings and these are 18 mmHg stockings  he bought from [employer]. The compression here is likely inadequate. He does not complain of pain or excessive drainage she has no systemic symptoms. The wound on the right looks improved as does the one on the left although one on the left is more substantial with still tissue at risk below the actual wound area on the bilateral posterior calf 02/05/16; patient arrives with poor edema control. He states that we did put a 4 layer compression on it last week. No weight appear 5 this. 02/12/16; the area on the posterior right Has healed. The left Has a substantial wound that has necrotic surface eschar that requires a debridement with a curette. 02/16/16;the patient called or a Nurse visit secondary to increased swelling. He had been in earlier in the week with his right leg healed. He was transitioned to is on pressure stocking on the right leg with the only open wound on the left, a substantial area on the left posterior calf. Note he has a history of severe lower extremity edema, he has a history of chronic atrial fibrillation but not heart failure per my notes but I'll need to research this. He is not complaining of chest pain shortness of breath or orthopnea. The intake nurse noted blisters on the previously closed right leg 02/19/16; this is the patient's regular visit day. I see him on Friday with escalating edema new wounds on the right leg and clear signs of  at least right ventricular heart failure. I increased his Lasix to 40 twice a day. He is returning currently in follow-up. States he is noticed a decrease in that the edema 02/26/16 patient's legs have much less edema. There is nothing really open on the right leg. The left leg has improved condition of the large superficial wound on the posterior left leg 03/04/16; edema control is very much better. The patient's right leg wounds have healed. On the left leg he continues to have severe venous inflammation on the posterior aspect of the left leg.  There is no tenderness and I don't think any of this is cellulitis. 03/11/16; patient's right leg is married healed and he is in his own stocking. The patient's left leg has deteriorated somewhat. There is a lot of erythema around the wound on the posterior left leg. There is also a significant rim of erythema posteriorly just above where the wrap would've ended there is a new wound in this location and a lot of tenderness. Can't rule out cellulitis in this area. 03/15/16; patient's right leg remains healed and he is in his own stocking. The patient's left leg is much better than last review. His major wound on the posterior aspect of his left Is almost fully epithelialized. He has 3 small injuries from the wraps. Really. Erythema seems a lot better on antibiotics 03/18/16; right leg remains healed and he is in his own stocking. The patient's left leg is much better. The area on the posterior aspect of the left calf is fully epithelialized. His 3 small injuries which were wrap injuries on the left are improved only one seems still open his erythema has resolved 03/25/16; patient's right leg remains healed and he is in his own stocking. There is no open area today on the left leg posterior leg is completely closed up. His wrap injuries at the superior aspect of his leg are also resolved. He looks as though he has some irritation on the dorsal ankle but this is fully epithelialized without evidence of infection. 03/28/16; we discharged this patient on Monday. Transitioned him into his own stocking. There were problems almost immediately with uncontrolled swelling weeping edema multiple some of which have opened. He does not feel systemically unwell in particular no chest pain no shortness of breath and he does not feel 04/08/16; the edema is under better control with the Profore light wrap but he still has pitting edema. There is one large wound anteriorly 2 on the medial aspect of his left leg and 3 small  areas on the superior posterior calf. Drainage is not excessive he is tolerating a Profore light well 04/15/16; put a Profore wrap on him last week. This is controlled is edema however he had a lot of pain on his left anterior foot most of his wounds are healed 04/22/16 once again the patient has denuded areas on the left anterior foot which he states are because his wrap slips up word. He saw his primary physician today is on Lasix 40 twice a day and states that he his weight is down 20 pounds over the last 3 months. 04/29/16: Much improved. left anterior foot much improved. He is now on Lasix 80 mg per day. Much improved edema control 05/06/16; I was hoping to be able to discharge him today however once again he has blisters at a low level of where the compression was placed last week mostly on his left lateral but also his left medial leg and a  small area on the anterior part of the left foot. 05/09/16; apparently the patient went home after his appointment on 7/4 later in the evening developing pain in his upper medial thigh together with subjective fever and chills although his temperature was not taken. The pain was so intense he felt he would probably have to call 911. However he then remembered that he had leftover doxycycline from a previous round of antibiotics and took these. By the next morning he felt a lot better. He called and spoke to one of our nurses and I approved doxycycline over the phone thinking that this was in relation to the wounds we had previously seen although they were definitely were not. The patient feels a lot better old fever no chills he is still working. Blood sugars are reasonably controlled 05/13/16; patient is back in for review of his cellulitis on his anterior medial upper thigh. He is taking doxycycline this is a lot better. Culture I did of the nodular area on the dorsal aspect of his foot grew MRSA this also looks a lot better. 05/20/16; the patient is cellulitis on  the medial upper thigh has resolved. All of his wound areas including the left anterior foot, areas on the medial aspect of the left calf and the lateral aspect of the calf at all resolved. He has a new blister on the left dorsal foot at the level of the fourth toe this was excised. No evidence of infection 05/27/16; patient continues to complain weeping edema. He has new blisterlike wounds on the left anterior lateral and posterior lateral calf at the top of his wrap levels. The area on his left anterior foot appears better. He is not complaining of fever, pain or pruritus in his feet. 05/30/16; the patient's blisters on his left anterior leg posterior calf all look improved. He did not increase the Lasix 100 mg as I suggested because he was going to run out of his 40 mg tablets. He is still having weeping edema of his toes 06/03/16; I renewed his Lasix at 80 mg once a day as he was about to run out when I last saw him. He is on 80 mg of Lasix now. I have asked him to cut down on the excessive amount of water he was drinking and asked him to drink according to his thirst mechanisms 06/12/2016 -- was seen 2 days ago and was supposed to wear his compression stockings at home but he is developed lymphedema and superficial blisters on the left lower extremity and hence came in for a review 06/24/16; the remaining wound is on his left anterior leg. He still has edema coming from between his toes. There is lymphedema here however his edema is generally better than when I last saw this. He has a history of atrial fibrillation but does not have a known history of congestive heart failure nevertheless I think he probably has this at least on a diastolic basis. 07/01/16 I reviewed his echocardiogram from January 2017. This was essentially normal. He did not have LVH, EF of 55-60%. His right ventricular function was normal although he did have trivial tricuspid and pulmonic regurgitation. This is not audible on exam  however. I increased his Lasix to do massive edema in his legs well above his knees I think in early July. He was also drinking an excessive amount of water at the time. 07/15/16; missed his appointment last week because of the Labor Day holiday on Monday. He could not get  another appointment later in the week. Started to feel the wrap digging in superiorly so we remove the top half and the bottom half of his wrap. He has extensive erythema and blistering superiorly in the left leg. Very tender. Very swollen. Edema in his foot with leaking edema fluid. He has not been systemically unwell 07/22/16; the area on the left leg laterally required some debridement. The medial wounds look more stable. His wrap injury wounds appear to have healed. Edema and his foot is better, weeping edema is also better. He tells me he is meeting with the supplier of the external compression pumps at work 08/05/16; the patient was on vacation last week in [facility]. His wrap is been on for an extended period of time. Also over the weekend he developed an extensive area of tender erythema across his anterior medial thigh. He took to doxycycline yesterday that he had leftover from a previous prescription. The patient complains of weeping edema coming out of his toes 08/08/16; I saw this patient on 10/2. He was tender across his anterior thigh. I put him on doxycycline. He returns today in follow-up. He does not have any open wounds on his lower leg, he still has edema weeping into his toes. 08/12/16; patient was seen back urgently today to follow-up for his extensive left thigh cellulitis/erysipelas. He comes back with a lot less swelling and erythema pain is much better. I believe I gave him Augmentin and Cipro. His wrap was cut down as he stated a roll down his legs. He developed blistering above the level of the wrap that remained. He has 2 open blisters and 1 intact. 08/19/16; patient is been doing his primary doctor who is  increased his Lasix from 40-80 once a day or 80 already has less edema. Cellulitis has remained improved in the left thigh. 2 open areas on the posterior left calf 08/26/16; he returns today having new open blisters on the anterior part of his left leg. He has his compression pumps but is not yet been shown how to use some vital representative from the supplier. 09/02/16 patient returns today with no open wounds on the left leg. Some maceration in his plantar toes 09/10/2016 -- Dr. Leanord Hawking had recently discharged him on 09/02/2016 and he has come right back with redness swelling and some open ulcers on his left lower extremity. He says this was caused by trying to apply his compression stockings and he's been unable to use this and has not been able to use his lymphedema pumps. He had some doxycycline leftover and he has started on this a few days ago. 09/16/16; there are no open wounds on his leg on the left and no evidence of cellulitis. 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 He does not complain of symptoms here. I am not clear use using his external compression pumps. 09/23/16; I have not seen this patient in 2 weeks. He canceled his appointment 10 days ago as he was going on vacation. He tells me that on Monday he noticed a large area on his posterior left leg which is been draining copiously and is reopened into a large wound. He is been using ABDs and the external part of his juxtalite, according to our nurse this was not on properly. 10/07/16; Still a substantial area on the posterior left leg. Using silver alginate 10/14/16; in general better although there is still open area which looks healthy. Still using  silver alginate. He reminds me that this happen before he left for [facility]. T oday while he was showering in the morning. He had been using his juxtalite's 10/21/16; the area on his posterior left leg is fully  epithelialized. However he arrives today with a large area of tender erythema in his medial and posterior left thigh just above the knee. I have marked the area. Once again he is reluctant to consider hospitalization. I treated him with oral antibiotics in the past for a similar situation with resolution I think with doxycycline however this area it seems more extensive to me. He is not complaining of fever but does have chills and says states he is thirsty. His blood sugar today was in the 140s at home 10/25/16 the area on his posterior left leg is fully epithelialized although there is still some weeping edema. The large area of tenderness and erythema in his medial and posterior left thigh is a lot less tender although there is still a lot of swelling in this thigh. He states he feels a lot better. He is on doxycycline and Augmentin that I started last week. This will continued until Tuesday, December 26. I have ordered a duplex ultrasound of the left thigh rule out DVT whether there is an abscess something that would need to be drained I would also like to know. 11/01/16; he still has weeping edema from a not fully epithelialized area on his left posterior calf. Most of the rest of this looks a lot better. He has completed his antibiotics. His thigh is a lot better. Duplex ultrasound did not show a DVT in the thigh 11/08/16; he comes in today with more Denuded surface epithelium from the posterior aspect of his calf. There is no real evidence of cellulitis. The superior aspect of his wrap appears to have put quite an indentation in his leg just below the knee and this may have contributed. He does not complain of pain or fever. We have been using silver alginate as the primary dressing. The area of cellulitis in the right thigh has totally resolved. He has been using his compression stockings once a week 11/15/16; the patient arrives today with more loss of epithelium from the posterior aspect of his  left calf. He now has a fairly substantial wound in this area. The reason behind this deterioration isn't exactly clear although his edema is not well controlled. He states he feels he is generally more swollen systemically. He is not complaining of chest pain shortness of breath fever. T me he has an appointment with his primary physician in early February. He is on 80 mg of oral ells Lasix a day. He claims compliance with the external compression pumps. He is not having any pain in his legs similar to what he has with his recurrent cellulitis 11/22/16; the patient arrives a follow-up of his large area on his left lateral calf. This looks somewhat better today. He came in earlier in the week for a dressing change since I saw him a week ago. He is not complaining of any pain no shortness of breath no chest pain 11/28/16; the patient arrives for follow-up of his large area on the left lateral calf this does not look better. In fact it is larger weeping edema. The surface of the wound does not look too bad. We have been using silver alginate although I'm not certain that this is a dressing issue. 12/05/16; again the patient follows up for a large wound  on the left lateral and left posterior calf this does not look better. There continues to be weeping edema necrotic surface tissue. More worrisome than this once again there is erythema below the wound involving the distal Achilles and heel suggestive of cellulitis. He is on his feet working most of the day of this is not going well. We are changing his dressing twice a week to facilitate the drainage. 12/12/16; not much change in the overall dimensions of the large area on the left posterior calf. This is very inflamed looking. I gave him an. Doxycycline last week does not really seem to have helped. He found the wrap very painful indeed it seems to of dog into his legs superiorly and perhaps around the heel. He came in early today because the drainage had  soaked through his dressings. 12/19/16- patient arrives for follow-up evaluation of his left lower extremity ulcers. He states that he is using his lymphedema pumps once daily when there is "no drainage". He admits to not using his lipedema pumps while under current treatment. His blood sugars have been consistently between 150-200. 12/26/16; the patient is not using his compression pumps at home because of the wetness on his feet. I've advised him that I think it's important for him to use this daily. He finds his feet too wet, he can put a plastic bag over his legs while he is in the pumps. Otherwise I think will be in a vicious circle. We are using silver alginate to the major area on his left posterior calf 01/02/17; the patient's posterior left leg has further of all into 3 open wounds. All of them covered with a necrotic surface. He claims to be using his compression pumps once a day. His edema control is marginal. Continue with silver alginate 01/10/17; the patient's left posterior leg actually looks somewhat better. There is less edema, less erythema. Still has 3 open areas covered with a necrotic surface requiring debridement. He claims to be using his compression pumps once a day his edema control is better 01/17/17; the patient's left posterior calf look better last week when I saw him and his wrap was changed 2 days ago. He has noted increasing pain in the left heel and arrives today with much larger wounds extensive erythema extending down into the entire heel area especially tender medially. He is not systemically unwell CBGs have been controlled no fever. Our intake nurse showed me limegreen drainage on his AVD pads. 01/24/17; his usual this patient responds nicely to antibiotics last week giving him Levaquin for presumed Pseudomonas. The whole entire posterior part of his leg is much better much less inflamed and in the case of his Achilles heel area much less tender. He has also had some  epithelialization posteriorly there are still open areas here and still draining but overall considerably better 01/31/17- He has continue to tolerate the compression wraps. he states that he continues to use the lymphedema pumps daily, and can increase to twice daily on the weekends. He is voicing no complaints or concerns regarding his LLE ulcers 02/07/17-he is here for follow-up evaluation. He states that he noted some erythema to the left medial and anterior thigh, which he states is new as of yesterday. He is concerned about recurrent cellulitis. He states his blood sugars have been slightly elevated, this morning in the 180s 02/14/17; he is here for follow-up evaluation. When he was last here there was erythema superiorly from his posterior wound in his anterior thigh.  He was prescribed Levaquin however a culture of the wound surface grew MRSA over the phone I changed him to doxycycline on Monday and things seem to be a lot better. 02/24/17; patient missed his appointment on Friday therefore we changed his nurse visit into a physician visit today. Still using silver alginate on the large area of the posterior left thigh. He isn't new area on the dorsal left second toe 03/03/17; actually better today although he admits he has not used his external compression pumps in the last 2 days or so because of work responsibilities over the weekend. 03/10/17; continued improvement. External compression pumps once a day almost all of his wounds have closed on the posterior left calf. Better edema control 03/17/17; in general improved. He still has 3 small open areas on the lateral aspect of his left leg however most of the area on the posterior part of his leg is epithelialized. He has better edema control. He has an ABD pad under his stocking on the right anterior lower leg although he did not let us look at that today. 03/24/17; patient arrives back in clinic today with no open areas however there are areas on the  posterior left calf and anterior left calf that are less than 100% epithelialized. His edema is well controlled in the left lower leg. There is some pitting edema probably lymphedema in the left upper thigh. He uses compression pumps at home once per day. I tried to get him to do this twice a day although he is very reticent. 04/01/2017 -- for the last 2 days he's had significant redness, tenderness and weeping and came in for an urgent visit today. 04/07/17; patient still has 6 more days of doxycycline. He was seen by Dr. Meyer Russel last Wednesday for cellulitis involving the posterior aspect, lateral aspect of his Involving his heel. For the most part he is better there is less erythema and less weeping. He has been on his feet for 12 hours o2 over the weekend. Using his compression pumps once a day 04/14/17 arrives today with continued improvement. Only one area on the posterior left calf that is not fully epithelialized. He has intense bilateral venous inflammation associated with his chronic venous insufficiency disease and secondary lymphedema. We have been using silver alginate to the left posterior calf wound In passing he tells [place] today that the right leg but we have not seen in quite some time has an open area on it but he doesn't want [place] to look at this today states he will show this to [place] next week. 04/21/17; there is no open area on his left leg although he still reports some weeping edema. He showed [place] his right leg today which is the first time we've seen this leg in a long time. He has a large area of open wound on the right leg anteriorly healthy granulation. Quite a bit of swelling in the right leg and some degree of venous inflammation. He told us about the right leg in passing last week but states that deterioration in the right leg really only happened over the weekend 04/28/17; there is no open area on the left leg although there is an irritated part on the posterior which is like a wrap  injury. The wound on the right leg which was new from last week at least to [place] is a lot better. 05/05/17; still no open area on the left leg. Patient is using his new compression stocking which seems to be  doing a good job of controlling the edema. He states he is using his compression pumps once per day. The right leg still has an open wound although it is better in terms of surface area. Required debridement. A lot of pain in the posterior right Achilles marked tenderness. Usually this type of presentation this patient gives concern for an active cellulitis 05/12/17; patient arrives today with his major wound from last week on the right lateral leg somewhat better. Still requiring debridement. He was using his compression stocking on the left leg however that is reopened with superficial wounds anteriorly he did not have an open wound on this leg previously. He is still using his juxta light's once daily at night. He cannot find the time to do this in the morning as he has to be at work by 7 AM 05/19/17; right lateral leg wound looks improved. No debridement required. The concerning area is on the left posterior leg which appears to almost have a subcutaneous hemorrhagic component to it. We've been using silver alginate to all the wounds 05/26/17; the right lateral leg wound continues to look improved. However the area on the left posterior calf is a tightly adherent surface. [place] using silver alginate. Because of the weeping edema in his legs there is very little good alternatives. 06/02/17; the patient left here last week looking quite good. Major wound on the left posterior calf and a small one on the right lateral calf. Both of these look satisfactory. He tells me that by Wednesday he had noted increased pain in the left leg and drainage. He called on Thursday and Friday to get an appointment here but we were blocked. He did not go to urgent care or his primary physician. He thinks he had a fever on  Thursday but did not actually take his temperature. He has not been using his compression pumps on the left leg because of pain. I advised him to go to the emergency room today for IV antibiotics for stents of left leg cellulitis but he has refused I have asked him to take 2 days off work to keep his leg elevated and he has refused this as well. In view of this I'm going to call him and Augmentin and doxycycline. 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 However after a detailed discussion regarding the risk benefits and alternatives he refuses to get admitted to the hospital. With no other recourse I will continue him on oral antibiotics as before and hopefully he'll have his infectious disease consultation this week. 06/16/2017 -- the patient was seen today by the nurse practitioner at infectious disease Ms. Dixon. 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 He has also been advise Lotrimin cream locally. she also had wise IV antibiotics for his condition if it flares up 06/23/17; patient arrives today with drainage bilaterally although the remaining wound on the left posterior calf after cleaning up today "highlighter yellow drainage" did not look too bad. Unfortunately he has had breakdown on the right anterior leg [previously this leg had not been  open and he is using a black stocking] he went to see infectious disease and is been put on clindamycin 150 mg daily, I did not verify the dose although I'm not familiar with using clindamycin in this dosing range, perhaps for  prophylaxisoo 06/27/17; I brought this patient back today to follow-up on the wound deterioration on the right lower leg together with surrounding cellulitis. I started him on doxycycline 4 days ago. This area looks better however he comes in today with intense cellulitis on the medial part of his left thigh. This is not have a wound in this area. Extremely tender. We've been using silver alginate to the wounds on the right lower leg left lower leg with bilateral 4 layer compression he is using his external compression pumps once a day 07/04/17; patient's left medial thigh cellulitis looks better. He has not been using his compression pumps as his insert said it was contraindicated with cellulitis. His right leg continues to make improvements all the wounds are still open. We only have one remaining wound on the left posterior calf. Using silver alginate to all open areas. He is on doxycycline which I started a week ago and should be finishing I gave him Augmentin after Thursday's visit for the severe cellulitis on the left medial thigh which fortunately looks better 07/14/17; the patient's left medial thigh cellulitis has resolved. The cellulitis in his right lower calf on the right also looks better. All of his wounds are stable to improved we've been using silver alginate he has completed the antibiotics I have given him. He has clindamycin 150 mg once a day prescribed by infectious disease for prophylaxis, I've advised him to start this now. We have been using bilateral Unna boots over silver alginate to the wound areas 07/21/17; the patient is been to see infectious disease who noted his recurrent problems with cellulitis. He was not able to tolerate prophylactic clindamycin therefore he is on amoxicillin 500 twice a day. He also had a second daily dose of Lasix added By Dr. Oneta Rack but he is not taking this. Nor is he being completely compliant with his compression pumps a especially not this week.  He has 2 remaining wounds one on the right posterior lateral lower leg and one on the left posterior medial lower leg. 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 The patient has Unna boots bilaterally. 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 He has bilateral Unna boots for compression and silver alginate to his wounds. Arrives today with his legs looking as good as I have seen him in quite some time. Not surprisingly his wounds look better as well with improvement on the right lateral leg venous insufficiency wound and also the left medial leg. He is still using the compression pumps once a day 08/11/17; both legs appear to be doing better wounds on the right lateral and left medial legs look better. Skin on the right leg quite good. He is been using silver alginate as the primary dressing. I'm going to use Anasept gel calcium alginate and maintain all the secondary dressings 08/18/17; the patient continues to actually do quite well. The area on his right lateral leg is just about closed the left medial also looks better although it is still moist in this area.  His edema is well controlled we have been using Anasept gel with calcium alginate and the usual secondary dressings, 4 layer compression and once daily use of his compression pumps "always been able to manage 09/01/17; the patient continues to do reasonably well in spite of his trip to [place]. The area on the right lateral leg is epithelialized. Left is much better but still open. He has more edema and more chronic erythema on the left leg [venous inflammation] 09/08/17; he arrives today with no open wound on the right lateral leg and decently controlled edema. Unfortunately his left leg is not nearly as in his  good situation as last week.he apparently had increasing edema starting on Saturday. He edema soaked through into his foot so used a plastic bag to walk around his home. The area on the medial right leg which was his open area is about the same however he has lost surface epithelium on the left lateral which is new and he has significant pain in the Achilles area of the left foot. He is already on amoxicillin chronically for prophylaxis of cellulitis in the left leg 09/15/17; he is completed a week of doxycycline and the cellulitis in the left posterior leg and Achilles area is as usual improved. He still has a lot of edema and fluid soaking through his dressings. There is no open wound on the right leg. He saw infectious disease NP today 09/22/17;As usual 1 we transition him from our compression wraps to his stockings things did not go well. He has several small open areas on the right leg. He states this was caused by the compression wrap on his skin although he did not wear this with the stockings over them. He has several superficial areas on the left leg medially laterally posteriorly. He does not have any evidence of active cellulitis especially involving the left Achilles The patient is traveling from [facility] Saturday going to [facility]. He states he isn't attempting to get an appointment with a heel objects wound center there to change his dressings. I am not completely certain whether this will work 10/06/17; the patient came in on Friday for a nurse visit and the nurse reported that his legs actually look quite good. He arrives in clinic today for his regular follow-up visit. He has a new wound on his left third toe over the PIP probably caused by friction with his footwear. He has small areas on the left leg and a very superficial but epithelialized area on the right anterior lateral lower leg. Other than that his legs look as good as I've seen him in quite some time. We have been using  silver alginate Review of systems; no chest pain no shortness of breath other than this a 10 point review of systems negative 10/20/17; seen by Dr. Meyer Russel last week. He had taken some antibiotics [doxycycline] that he had left over. Dr. Meyer Russel thought he had candida infection and declined to give him further antibiotics. He has a small wound remaining on the right lateral leg several areas on the left leg including a larger area on the left posterior several left medial and anterior and a small wound on the left lateral. The area on the left dorsal third toe looks a lot better. ROS; Gen.; no fever, respiratory no cough no sputum Cardiac no chest pain other than this 10 point review of system is negative 10/30/17; patient arrives today having fallen in the bathtub 3 days ago. It took him a  while to get up. He has pain and maceration in the wounds on his left leg which have deteriorated. He has not been using his pumps he also has some maceration on the right lateral leg. 11/03/17; patient continues to have weeping edema especially in the left leg. This saturates his dressings which were just put on on 12/27. As usual the doxycycline seems to take care of the cellulitis on his lower leg. He is not complaining of fever, chills, or other systemic symptoms. He states his leg feels a lot better on the doxycycline I gave him empirically. He also apparently gets injections at his primary doctor's officeo Rocephin for cellulitis prophylaxis. I didn't ask him about his compression pump compliance today I think that's probably marginal. Arrives in the clinic with all of his dressings primary and secondary macerated full of fluid and he has bilateral edema 11/10/17; the patient's right leg looks some better although there is still a cluster of wounds on the right lateral. The left leg is inflamed with almost circumferential skin loss medially to laterally although we are still maintaining anteriorly. He does not  have overt cellulitis there is a lot of drainage. He is not using compression pumps. We have been using silver alginate to the wound areas, there are not a lot of options here 11/17/17; the patient's right leg continues to be stable although there is still open wounds, better than last week. The inflammation in the left leg is better. Still loss of surface layer epithelium especially posteriorly. There is no overt cellulitis in the amount of edema and his left leg is really quite good, tells me he is using his compression pumps once a day. 11/24/17; patient's right leg has a small superficial wound laterally this continues to improve. The inflammation in the left leg is still improving however we have continuous surface layer epithelial loss posteriorly. There is no overt cellulitis in the amount of edema in both legs is really quite good. He states he is using his compression pumps on the left leg once a day for 5 out of 7 days 12/01/17; very small superficial areas on the right lateral leg continue to improve. Edema control in both legs is better today. He has continued loss of surface epithelialization and left posterior calf although I think this is better. We have been using silver alginate with large number of absorptive secondary dressings 4 layer on the left Unna boot on the right at his request. He tells me he is using his compression pumps once a day 12/08/17; he has no open area on the right leg is edema control is good here. ooOn the left leg however he has marked erythema and tenderness breakdown of skin. He has what appears to be a wrap injury just distal to the popliteal fossa. 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 There was swelling erythema and breakdown in the right leg. He has much worse skin breakdown in the left leg as well multiple open areas medially and posteriorly as well as laterally. He tells me he has been using his compression pumps but tells me he feels that the drainage out of his leg is worse when he uses a compression pumps. T be  fair to him he is been saying this o for a while however I don't know that I have really been listening to this. I wonder if the compression pumps are working properly 12/22/17;. Once again he arrives with severe erythema, weeping edema from the left greater than right leg. Noncompliance with compression pumps. New this visit he is complaining of pain on the lateral aspect of the right leg and the medial aspect of his right thigh. He apparently saw his cardiologist Dr. [name] who was ordered an echocardiogram area and I think this is a step in the right direction 12/25/17; started his doxycycline Monday night. There is still intense erythema of the right leg especially in the anterior thigh although there is less tenderness. The erythema around the wound on the right lateral calf also is less tender. He still complaining of pain in the left heel. His wounds are about the same right lateral left medial left lateral. Superficial but certainly not close to closure. He denies being systemically unwell no fever chills no abdominal pain no diarrhea 12/29/17; back in follow-up of his extensive right calf and right thigh cellulitis. I added amoxicillin to cover possible doxycycline resistant strep. This seems to of done the trick he is in much less pain there is much less erythema and swelling. He has his echocardiogram at 11:00 this morning. X-ray of the left heel was also negative. 01/05/18; the patient arrived with his edema under much better control. Now that he is retired he is able to use his compression pumps daily and sometimes twice a day per the patient. He has a  wound on the right leg the lateral wound looks better. Area on the left leg also looks a lot better. He has no evidence of cellulitis in his bilateral thighs I had a quick peak at his echocardiogram. He is in normal ejection fraction and normal left ventricular function. He has moderate pulmonary hypertension moderately reduced right ventricular function. One would have to wonder about chronic sleep apnea although he says he doesn't snore. He'll review the echocardiogram with his cardiologist. 01/12/18; the patient arrives with the edema in both legs under exemplary control. He is using his compression pumps daily and sometimes twice daily. His wound on the right lateral leg is just about closed. He still has some weeping areas on the posterior left calf and lateral left calf although everything is just about closed here as well. I have spoken with Aldean Baker who is the patient's nurse practitioner and infectious disease. She was concerned that the patient had not understood that the parenteral penicillin injections he was receiving for cellulitis prophylaxis was actually benefiting him. I don't think the patient actually saw that I would tend to agree we were certainly dealing with less infections although he had a serious one last month. 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 He is healed. He'll be placed in juxtalite compression wraps and increase his lymphedema pumps to twice daily. We will follow up again next week to ensure there are no issues with the new regiment. 01/20/18-he is here for evaluation of bilateral lower extremity weeping edema. Yesterday he was placed in compression wrap to the right lower extremity and compression stocking to left lower shrubbery. He states he uses lymphedema pumps last night and again this morning and noted a blister to the left lower extremity. On exam he was noted to have drainage to the right lower extremity. He  will be placed  in Unna boots bilaterally and follow-up next week 01/26/18; patient was actually discharged a week ago to his own juxta light stockings only to return the next day with bilateral lower extremity weeping edema.he was placed in bilateral Unna boots. He arrives today with pain in the back of his left leg. There is no open area on the right leg however there is a linear/wrap injury on the left leg and weeping edema on the left leg posteriorly. I spoke with infectious disease about 10 days ago. They were disappointed that the patient elected to discontinue prophylactic intramuscular penicillin shots as they felt it was particularly beneficial in reducing the frequency of his cellulitis. I discussed this with the patient today. He does not share this view. He'll definitely need antibiotics today. Finally he is traveling to [place] and trauma leaving this Saturday and returning a week later and he does not travel with his pumps. He is going by car 01/30/18; patient was seen 4 days ago and brought back in today for review of cellulitis in the left leg posteriorly. I put him on amoxicillin this really hasn't helped as much as I might like. He is also worried because he is traveling to [facility] trauma by car. Finally we will be rewrapping him. There is no open area on the right leg over his left leg has multiple weeping areas as usual 02/09/18; The same wrap on for 10 days. He did not pick up the last doxycycline I prescribed for him. He apparently took 4 days worth he already had. There is nothing open on his right leg and the edema control is really quite good. He's had damage in the left leg medially and laterally especially probably related to the prolonged use of Unna boots 02/12/18; the patient arrived in clinic today for a nurse visit/wrap change. He complained of a lot of pain in the left posterior calf. He is taking doxycycline that I previously prescribed for him. Unfortunately even though  he used his stockings and apparently used to compression pumps twice a day he has weeping edema coming out of the lateral part of his right leg. This is coming from the lower anterior lateral skin area. 02/16/18; the patient has finished his doxycycline and will finish the amoxicillin 2 days. The area of cellulitis in the left calf posteriorly has resolved. He is no longer having any pain. He tells me he is using his compression pumps at least once a day sometimes twice. 02/23/18; the patient finished his doxycycline and Amoxil last week. On Friday he noticed a small erythematous circle about the size of a quarter on the left lower leg just above his ankle. This rapidly expanded and he now has erythema on the lateral and posterior part of the thigh. This is bright red. Also has an area on the dorsal foot just above his toes and a tender area just below the left popliteal fossa. He came off his prophylactic penicillin injections at his own insistence one or 2 months ago. This is obviously deteriorated since then 03/02/18; patient is on doxycycline and Amoxil. Culture I did last week of the weeping area on the back of his left calf grew group B strep. I have therefore renewed the amoxicillin 500 3 times a day for a further week. He has not been systemically unwell. Still complaining of an area of discomfort right under his left popliteal fossa. There is no open wound on the right leg. He tells me that he is using his  pumps twice a day on most days 03/09/18; patient arrives in clinic today completing his amoxicillin today. The cellulitis on his left leg is better. Furthermore he tells me that he had intramuscular penicillin shots that his primary care office today. However he also states that the wrap on his right leg fell down shortly after leaving clinic last week. He developed a large blister that was present when he came in for a nurse visit later in the week and then he developed intense discomfort around  this area.He tells me he is using his compression pumps 03/16/18; the patient has completed his doxycycline. The infectious part of this/cellulitis in the left heel area left popliteal area is a lot better. He has 2 open areas on the right calf. Still areas on the left calf but this is a lot better as well. 03/24/18; the patient arrives complaining of pain in the left popliteal area again. He thinks some of this is wrap injury. He has no open area on the right leg and really no open area on the left calf either except for the popliteal area. He claims to be compliant with the compression pumps 03/31/18; I gave him doxycycline last week because of cellulitis in the left popliteal area. This is a lot better although the surface epithelium is denuded off and response to this. He arrives today with uncontrolled edema in the right calf area as well as a fingernail injury in the right lateral calf. There is only a few open areas on the left 04/06/18; I gave him amoxicillin doxycycline over the last 2 weeks that the amoxicillin should be completing currently. He is not complaining of any pain or systemic symptoms. The only open areas see has is on the right lateral lower leg paradoxically I cannot see anything on the left lower leg. He tells me he is using his compression pumps twice a day on most days. Silver alginate to the wounds that are open under 4 layer compression 04/13/18; he completed antibiotics and has no new complaints. Using his compression pumps. Silver alginate that anything that's opened 04/20/18; he is using his compression pumps religiously. Silver alginate 4 layer compression anything that's opened. He comes in today with no open wounds on the left leg but 3 on the right including a new one posteriorly. He has 2 on the right lateral and one on the right posterior. He likes Unna boots on the right leg for reasons that aren't really clear we had the usual 4 layer compression on the left. It may be  necessary to move to the 4 layer compression on the right however for now I left them in the Unna boots 04/27/18; he is using his compression pumps at least once a day. He has still the wounds on the right lateral calf. The area right posteriorly has closed. He does not have an open wound on the left under 4 layer compression however on the dorsal left foot just proximal to the toes and the left third toe 2 small open areas were identified 05/11/18; he has not uses compression pumps. The areas on the right lateral calf have coalesced into one large wound necrotic surface. On the left side he has one small wound anteriorly however the edema is now weeping out of a large part of his left leg. He says he wasn't using his pumps because of the weeping fluid. I explained to him that this is the time he needs to pump more 05/18/18; patient states he  is using his compression pumps twice a day. The area on the right lateral large wound albeit superficial. On the left side he has innumerable number of small new wounds on the left calf particularly laterally but several anteriorly and medially. All these appear to have healthy granulated base these look like the remnants of blisters however they occurred under compression. The patient arrives in clinic today with his legs somewhat better. There is certainly less edema, less multiple open areas on the left calf and the right anterior leg looks somewhat better as well superficial and a little smaller. However he relates pain and erythema over the last 3-4 days in the thigh and I looked at this today. He has not been systemically unwell no fever no chills no change in blood sugar values 05/25/18; comes in today in a better state. The severe cellulitis on his left leg seems better with the Keflex. Not as tender. He has not been systemically unwell ooHard to find an open wound on the left lower leg using his compression pumps twice a day ooThe confluent wounds on his  right lateral calf somewhat better looking. These will ultimately need debridement I didn't do this today. 06/01/18; the severe cellulitis on the left anterior thigh has resolved and he is completed his Keflex. ooThere is no open wound on the left leg however there is a superficial excoriation at the base of the third toe dorsally. Skin on the bottom of his left foot is macerated looking. ooThe left the wounds on the lateral right leg actually looks some better although he did require debridement of the top half of this wound area with an open curet 06/09/18 on evaluation today patient appears to be doing poorly in regard to his right lower extremity in particular this appears to likely be infected he has very thick purulent discharge along with a bright green tent to the discharge. This makes me concerned about the possibility of pseudomonas. He's also having increased discomfort at this point on evaluation. Fortunately there does not appear to be any evidence of infection spreading to the other location at this time. 06/16/18 on evaluation today patient appears to actually be doing fairly well. His ulcer has actually diminished in size quite significantly at this point which is good news. Nonetheless he still does have some evidence of infection he did see infectious disease this morning before coming here for his appointment. I did review the results of their evaluation and their note today. They did actually have him discontinue the Cipro and initiate treatment with linezolid at this time. He is doing this for the next seven days and they recommended a follow-up in four months with them. He is the keep a log of the need for intermittent antibiotic therapy between now and when he falls back up with infectious disease. This will help them gaze what exactly they need to do to try and help them out. 06/23/18; the patient arrives today with no open wounds on the left leg and left third toe healed. He is  been using his compression pumps twice a day. On the right lateral leg he still has a sizable wound but this is a lot better than last time I saw this. 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 Has been using silver alginate under 4 layer compression 06/30/18; the only open wound he has is on the right lateral  leg and this looks healthy. No debridement is required. We have been using silver alginate. He does not have an open wound on the left leg. There is apparently some drainage from the dorsal proximal third toe on the left although I see no open wound here. 07/03/18 on evaluation today patient was actually here just for a nurse visit rapid change. However when he was here on Wednesday for his rat change due to having been healed on the left and then developing blisters we initiated the wrap again knowing that he would be back today for [place] to reevaluate and see were at. Unfortunately he has developed some cellulitis into the proximal portion of his right lower extremity even into the region of his thigh. 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 He was placed on one as what at that point. Nonetheless he is done with that and has been tolerating it well otherwise. Doxycycline which in the past really did not seem to be effective for him. Nonetheless I think the best option may be for [place] to definitely reinitiate the antibiotics for a longer period of time. 07/07/18; since I last saw this patient a week ago he has had a difficult time. At that point he did not have an open wound on his left leg. We transitioned him into juxta light stockings. He was apparently in the clinic the next day with blisters on the left lateral and left medial lower calf. He also had weeping edema fluid. He was put back into a compression wrap. He was also in the clinic on Friday with  intense erythema in his right thigh. Per the patient he was started on Bactrim however that didn't work at all in terms of relieving his pain and swelling. He has taken 3 doxycycline that he had left over from last time and that seems to of helped. He has blistering on the right thigh as well. 07/14/18; the erythema on his right thigh has gotten better with doxycycline that he is finishing. 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 Presumably this cellulitis in the thigh was not related to that although I think this is a bit concerning going forward. He still has an area on the right lateral calf the blister on the right medial calf just below the knee that was discussed above. On the left 2 small open areas left medial and left lateral. Edema control is adequate. He is using his compression pumps twice a day 07/20/18; continued improvement in the condition of both legs especially the edema in his bilateral thighs. He tells me he is been losing weight through a combination of diet and exercise. He is using his compression pumps twice a day. So overall she made to the remaining wounds 07/27/2018; continued improvement in condition of both legs. His edema is well controlled. The area on the right lateral leg is just about closed he had one blisters show up on the medial left upper calf. We have him in 4 layer compression. He is going on a 10-day trip to [place]. He will be driving. He wants to wear Unna boots because of the lessening amount of constriction. He will not use compression pumps while he is away 08/05/18 on evaluation today patient actually appears to be doing decently well all things considered in regard to his bilateral lower  extremities. The worst ulcer is actually only posterior aspect of his left lower extremity with a four layer compression wrap cut into his leg a couple weeks back. He  did have a trip and actually had [employer] for the trip that he is worn since he was last here. Nonetheless he feels like the [employer] actually do better for him his swelling is up a little bit but he also with his trip was not taking his Lasix on a regular set schedule like he was supposed to be. He states that obviously the reason being that he cannot drive and keep going without having to urinate too frequently which makes it difficult. He did not have his pumps with him while he was away either which I think also maybe playing a role here too. 08/13/2018; the patient only has a small open wound on the right lateral calf which is a big improvement in the last month or 2. He also has the area posteriorly just below the posterior fossa on the left which I think was a wrap injury from several weeks ago. He has no current evidence of cellulitis. He tells me he is back into his compression pumps twice a day. He also tells me that while he was at the laundromat somebody stole a section of his extremitease stockings 08/20/2018; back in the clinic with a much improved state. He only has small areas on the right lateral mid calf which is just about healed. This was is more substantial area for quite a prolonged period of time. He has a small open area on the left anterior tibia. The area on the posterior calf just below the popliteal fossa is closed today. He is using his compression pumps twice a day 08/28/2018; patient has no open wound on the right leg. He has a smattering of open areas on the calf with some weeping lymphedema. More problematically than that it looks as though his wraps of slipped down in his usual he has very angry upper area of edema just below the right medial knee and on the right lateral calf. He has no open area on his feet. The patient is traveling to [facility] next week. I will send him in an antibiotic. We will continue to wrap the right leg. We ordered  extremitease stockings for him last week and I plan to transition the right leg to a stocking when he gets home which will be in 10 days time. As usual he is very reluctant to take his pumps with him when he travels 09/07/2018; patient returns from [facility]. He shows me a picture of his left leg in the mid part of his trip last week with intense fire engine erythema. The picture look bad enough I would have considered sending him to the hospital. Instead he went to the wound care center in [facility]. They did not prescribe him antibiotics but he did take some doxycycline he had leftover from a previous visit. I had given him trimethoprim sulfamethoxazole before he left this did not work according to the patient. This is resulted in some improvement fortunately. He comes back with a large wound on the left posterior calf. Smaller area on the left anterior tibia. Denuded blisters on the dorsal left foot over his toes. Does not have much in the way of wounds on the right leg although he does have a very tender area on the right posterior area just below the popliteal fossa also  suggestive of infection. He promises me he is back on his pumps twice a day 09/15/2018; the intense cellulitis in his left lower calf is a lot better. The wound area on the posterior left calf is also so better. However he has reasonably extensive wounds on the dorsal aspect of his second and third toes and the proximal foot just at the base of the toes. There is nothing open on the right leg 09/22/2018; the patient has excellent edema control in his legs bilaterally. He is using his external compression pumps twice a day. He has no open area on the right leg and only the areas in the left foot dorsally second and third toe area on the left side. He does not have any signs of active cellulitis. 10/06/2018; the patient has good edema control bilaterally. He has no open wound on the right leg. There is a blister in the  posterior aspect of his left calf that we had to deal with today. He is using his compression pumps twice a day. There is no signs of active cellulitis. We have been using silver alginate to the wound areas. He still has vulnerable areas on the base of his left first second toes dorsally He has a his extremities stockings and we are going to transition him today into the stocking on the right leg. He is cautioned that he will need to continue to use the compression pumps twice a day. If he notices uncontrolled edema in the right leg he may need to go to 3 times a day. 10/13/2018; the patient came in for a nurse check on Friday he has a large flaccid blister on the right medial calf just below the knee. We unroofed this. He has this and a new area underneath the posterior mid calf which was undoubtedly a blister as well. He also has several small areas on the right which is the area we put his extremities stocking on. 10/19/2018; the patient went to see infectious disease this morning I am not sure if that was a routine follow-up in any case the doxycycline I had given him was discontinued and started on linezolid. He has not started this. It is easy to look at his left calf and the inflammation and think this is cellulitis however he is very tender in the tissue just below the popliteal fossa and I have no doubt that there is infection going on here. He states the problem he is having is that with the compression pumps the edema goes down and then starts walking the wrap falls down. We will see if we can adhere this. He has 1 or 2 minuscule open areas on the right still areas that are weeping on the posterior left calf, the base of his left second and third toes 10/26/18; back today in clinic with quite of skin breakdown in his left anterior leg. This may have been infection the area below the popliteal fossa seems a lot better however tremendous epithelial loss on the left anterior mid tibia area over  quite inexpensive tissue. He has 2 blisters on the right side but no other open wound here. 10/29/2018; came in urgently to see [place] today and we worked him in for review. He states that the 4 layer compression on the right leg caused pain he had to cut it down to roughly his mid calf this caused swelling above the wrap and he has blisters and skin breakdown today. As a result of the pain he has  not been using his pumps. Both legs are a lot more edematous and there is a lot of weeping fluid. 11/02/18; arrives in clinic with continued difficulties in the right leg> left. Leg is swollen and painful. multiple skin blisters and new open areas especially laterally. He has not been using his pumps on the right leg. 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 He is not systemically unwell. 11/09/2018; the patient claims he is being compliant with his pumps. He is finished the doxycycline I gave him last week. Culture I did of the wound on the right lateral leg showed a few very resistant methicillin staph aureus. This was resistant to doxycycline. Nevertheless he states the pain in the leg is a lot better which makes me wonder if the cultured organism was not really what was causing the problem nevertheless this is a very dangerous organism to be culturing out of any wound. His right leg is still a lot larger than the left. He is using an Radio broadcast assistant on this area, he blames a 4-layer compression for causing the original skin breakdown which I doubt is true however I cannot talk him out of it. We have been using silver alginate to all of these areas which were initially blisters 11/16/2018; patient is being compliant with his external compression pumps at twice a day. Miraculously he arrives in clinic today with absolutely no open wounds. He has better edema control on the left where he has been using 4 layer compression versus wound of wounds on the right and I pointed  this out to him. There is no inflammation in the skin in his lower legs which is also somewhat unusual for him. There is no open wounds on the dorsal left foot. He has extremitease stockings at home and I have asked him to bring these in next week. 11/25/18 patient's lower extremity on examination today on the left appears for the most part to be wound free. He does have an open wound on the lateral aspect of the right lower extremity but this is minimal compared to what I've seen in past. He does request that we go ahead and wrap the left leg as well even though there's nothing open just so hopefully it will not reopen in short order. 1/28; patient has superficial open wounds on the right lateral calf left anterior calf and left posterior calf. His edema control is adequate. 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 We have been using silver alginate as the primary dressing. He claims compliance with his compression pumps 2/4; patient has superficial open wounds on numerous areas of his left calf and again one on the left dorsal foot. The areas on the right lateral calf have healed. The cellulitis that I gave him doxycycline for last week is also resolved this was mostly on the left anterior calf just below the tibial tuberosity. His edema looks fairly well-controlled. He tells me he went to see his primary doctor today and had blood work ordered 2/11; once again he has several open areas on the left calf left tibial area. Most of these are small and appear to have healthy granulation. He does not have anything open on the right. The edema and control in his thighs is pretty good which is usually a good indication he has been using his pumps as requested. 2/18; he continues to have  several small areas on the left calf and left tibial area. Most of these are small healthy granulation. We put him in his stocking  on the right leg last week and he arrives with a superficial open area over the right upper tibia and a fairly large area on the right lateral tibia in similar condition. His edema control actually does not look too bad, he claims to be using his compression pumps twice a day 2/25. Continued small areas on the left calf and left tibial area. New areas especially on the right are identified just below the tibial tuberosity and on the right upper tibia itself. There are also areas of weeping edema fluid even without an obvious wound. He does not have a considerable degree of lymphedema but clearly there is more edema here than his skin can handle. He states he is using the pumps twice a day. We have an Unna boot on the right and 4 layer compression on the left. 3/3; he continues to have an area on the right lateral calf and right posterior calf just below the popliteal fossa. There is a fair amount of tenderness around the wound on the popliteal fossa but I did not see any evidence of cellulitis, could just be that the wrap came down and rubbed in this area. ooHe does not have an open area on the left leg however there is an area on the left dorsal foot at the base of the third toe ooWe have been using silver alginate to all wound areas 3/10; he did not have an open area on his left leg last time he was here a week ago. T oday he arrives with a horizontal wound just below the tibial tuberosity and an area on the left lateral calf. He has intense erythema and tenderness in this area. The area is on the right lateral calf and right posterior calf better than last week. We have been using silver alginate as usual 3/18 - Patient returns with 3 small open areas on left calf, and 1 small open area on right calf, the skin looks ok with no significant erythema, he continues the UNA boot on right and 4 layer compression on left. The right lateral calf wound is closed , the right posterior is small area. we  will continue silver alginate to the areas. 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 He is been tolerating the dressing changes without complication. Fortunately she has made excellent improvement in regard to the overall status of his wounds. Unfortunately every time we cease wrapping him he ends up reopening in causing more significant issues at that point. Again I'm unsure of the best direction to take although I think the lymphedema clinic may be appropriate for him. 02/03/19 on evaluation today patient appears to be doing well in regard to the wounds that we saw him for last week unfortunately he has a new area on the proximal portion of his right medial/posterior lower extremity where the wrap somewhat slowed down and caused swelling and a blister to rub and open. Unfortunately this is the only opening that he has on either leg at this point. 02/17/19 on evaluation today patient's bilateral lower extremities appear to be doing well. He still completely healed in regard to the left lower extremity. In regard to the  right lower extremity the area where the wrap and slid down and caused the blister still seems to be slightly open although this is dramatically better than during the last evaluation two weeks ago. I'm very pleased with the way this stands overall. 03/03/19 on evaluation today patient appears to be doing well in regard to his right lower extremity in general although he did have a new blister open this does not appear to be showing any evidence of active infection at this time. Fortunately there's No fevers, chills, nausea, or vomiting noted at this time. Overall I feel like he is making good progress it does feel like that the right leg will we perform the [employer] seems to do with a bit  better than three layer wrap on the left which slid down on him. We may switch to doing bilateral in the book wraps. 5/4; I have not seen Mr. Lindon in quite some time. According to our case manager he did not have an open wound on his left leg last week. He had 1 remaining wound on the right posterior medial calf. He arrives today with multiple openings on the left leg probably were blisters and/or wrap injuries from Unna boots. I do not think the Unna boot's will provide adequate compression on the left. I am also not clear about the frequency he is using the compression pumps. 03/17/19 on evaluation today patient appears to be doing excellent in regard to his lower extremities compared to last week's evaluation apparently. He had gotten significantly worse last week which is unfortunate. The [employer] wrap on the left did not seem to do very well for him at all and in fact it didn't control his swelling significantly enough he had an additional outbreak. Subsequently we go back to the four layer compression wrap on the left. This is good news. At least in that he is doing better and the wound seem to be killing him. He still has not heard anything from the lymphedema clinic. 03/24/19 on evaluation today patient actually appears to be doing much better in regard to his bilateral lower Trinity as compared to last week when I saw him. Fortunately there's no signs of active infection at this time. He has been tolerating the dressing changes without complication. Overall I'm extremely pleased with the progress and appearance in general. 04/07/19 on evaluation today patient appears to be doing well in regard to his bilateral lower extremities. His swelling is significantly down from where it was previous. With that being said he does have a couple blisters still open at this point but fortunately nothing that seems to be too severe and again the majority of the larger openings has healed at this  time. 04/14/19 on evaluation today patient actually appears to be doing quite well in regard to his bilateral lower extremities in fact I'm not even sure there's anything significantly open at this time at any site. Nonetheless he did have some trouble with these wraps where they are somewhat irritating him secondary to the fact that he has noted that the graph wasn't too close down to the end of this foot in a little bit short as well up to his knee. Otherwise things seem to be doing quite well. 04/21/19 upon evaluation today patient's wound bed actually showed evidence of being completely healed in regard to both lower extremities which is excellent news. There does not appear to be any signs of active infection which is also good news. I'm very  pleased in this regard. No fevers, chills, nausea, or vomiting noted at this time. 04/28/19 on evaluation today patient appears to be doing a little bit worse in regard to both lower extremities on the left mainly due to the fact that when he went infection disease the wrap was not wrapped quite high enough he developed a blister above this. On the right he is a small open area of nothing too significant but again this is continuing to give him some trouble he has been were in the Velcro compression that he has at home. 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 He's been tolerating the dressing changes without complication. Fortunately there's no signs of active infection at this time. No fevers, chills, nausea, or vomiting noted at this time. We have been trying to get an appointment with her lymphedema clinic in [facility] but unfortunately nobody can get them on phone with not been able to even fax information over the patient likewise is not been able to get in touch with them. Overall I'm not sure exactly what's going on here with to reach out again today. 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 Still having a lot of drainage unfortunately. He tells me especially in the left but even on the right. There's no signs of active infection which is good news we've been using so ratcheted up to this point. 05/19/19 on evaluation today patient actually appears to be doing quite well with regard to his left lower extremity which is great news. Fortunately in regard to the right lower extremity has an issues with his wrap and he subsequently did remove this from what I'm understanding. Nonetheless long story short is what he had rewrapped once he removed it subsequently had maggots underneath this wrap whenever he came in for evaluation today. With that being said they were obviously completely cleaned away by the nursing staff. The visit today which is excellent news. However he does appear to potentially have some infection around the right ankle region where the maggots were located as well. He will likely require anabiotic therapy today. 05/26/19 on evaluation today patient actually appears to be doing much better in regard to his bilateral lower extremities. I feel like the infection is under much better control. With that being said there were maggots noted when the wrap was removed yet again today. Again this could have potentially been left over from previous although at this time there does not appear to be any signs of significant drainage there was obviously on the wrap some drainage as well this contracted gnats or otherwise. Either way I do not see anything that appears to be doing worse in my pinion and in fact I think his drainage has slowed down quite significantly likely mainly due to the fact to his infection being under better control. 06/02/2019 on evaluation today patient actually appears to be doing well with regard to his bilateral lower extremities there is no signs of active  infection at this time which is great news. With that being said he does have several open areas more so on the right than the left but nonetheless these are all significantly better than previously noted. 06/09/2019 on evaluation today patient actually appears to be doing well. His wrap stayed up and he did not cause any problems he had more drainage on the  right compared to the left but overall I do not see any major issues at this time which is great news. 06/16/2019 on evaluation today patient appears to be doing excellent with regard to his lower extremities the only area that is open is a new blister that can have opened as of today on the medial ankle on the left. Other than this he really seems to be doing great I see no major issues at this point. 06/23/2019 on evaluation today patient appears to be doing quite well with regard to his bilateral lower extremities. In fact he actually appears to be almost completely healed there is a small area of weeping noted of the right lower extremity just above the ankle. Nonetheless fortunately there is no signs of active infection at this time which is good news. No fevers, chills, nausea, vomiting, or diarrhea. 8/24; the patient arrived for a nurse visit today but complained of very significant pain in the left leg and therefore I was asked to look at this. Noted that he did not have an open area on the left leg last week nevertheless this was wrapped. The patient states that he is not been able to put his compression pumps on the left leg because of the discomfort. He has not been systemically unwell 06/30/2019 on evaluation today patient unfortunately despite being excellent last week is doing much worse with regard to his left lower extremity today. In fact he had to come in for a nurse on Monday where his left leg had to be rewrapped due to excessive weeping Dr. Leanord Hawking placed him on doxycycline at that point. Fortunately there is no signs of active  infection Systemically at this time which is good news. 07/07/2019 in regard to the patient's wounds today he actually seems to be doing well with his right lower extremity there really is nothing open or draining at this point this is great news. Unfortunately the left lower extremity is given him additional trouble at this time. There does not appear to be any signs of active infection nonetheless he does have a lot of edema and swelling noted at this point as well as blistering all of which has led to a much more poor appearing leg at this time compared to where it was 2 weeks ago when it was almost completely healed. Obviously this is a little discouraging for the patient. He is try to contact the lymphedema clinic in [place] he has not been able to get through to them. 07/14/2019 on evaluation today patient actually appears to be doing slightly better with regard to his left lower extremity ulcers. Overall I do feel like at least at the top of the wrap that we have been placing this area has healed quite nicely and looks much better. The remainder of the leg is showing signs of improvement. Unfortunately in the thigh area he still has an open region on the left and again on the right he has been utilizing just a Band-Aid on an area that also opened on the thigh. Again this is an area that were not able to wrap although we did do an Ace wrap to provide some compression that something that obviously is a little less effective than the compression wraps we have been using on the lower portion of the leg. He does have an appointment with the lymphedema clinic in [facility] on Friday. 07/21/2019 on evaluation today patient appears to be doing better with regard to his lower extremity ulcers. He has  been tolerating the dressing changes without complication. Fortunately there is no signs of active infection at this time. No fevers, chills, nausea, vomiting, or diarrhea. I did receive the paperwork  from the physical therapist at the lymphedema clinic in [place]. Subsequently I signed off on that this morning and sent that back to him for further progression with the treatment plan. 07/28/2019 on evaluation today patient appears to be doing very well with regard to his right lower extremity where I do not see any open wounds at this point. Fortunately he is feeling great as far as that is concerned as well. In regard to the left lower extremity he has been having issues with still several areas of weeping and edema although the upper leg is doing better his lower leg still I think is going require the compression wrap at this time. No fevers, chills, nausea, vomiting, or diarrhea. 08/04/2019 on evaluation today patient unfortunately is having new wounds on the right lower extremity. Again we have been using Unna boot wrap on that side. We switched him to using his juxta lite wrap at home. With that being said he tells me he has been using it although his legs extremely swollen and to be honest really does not appear that he has been. I cannot know that for sure however. Nonetheless he has multiple new wounds on the right lower extremity at this time. Obviously we will have to see about getting this rewrapped for him today. 08/11/2019 on evaluation today patient appears to be doing fairly well with regard to his wounds. He has been tolerating the dressing changes including the compression wraps without complication. He still has a lot of edema in his upper thigh regions bilaterally he is supposed to be seeing the lymphedema clinic on the 15th of this month once his wraps arrive for the upper part of his legs. 08/18/2019 on evaluation today patient appears to be doing well with regard to his bilateral lower extremities at this point. He has been tolerating the dressing changes without complication. Fortunately there is no signs of active infection which is also good news. He does have a couple  weeping areas on the first and second toe of the right foot he also has just a small area on the left foot upper leg and a small area on the left lower leg but overall he is doing quite well in my opinion. He is supposed to be getting his wraps shortly in fact tomorrow and then subsequently is seeing the lymphedema clinic next Wednesday on the 21st. Of note he is also leaving on the 25th to go on vacation for a week to the beach. For that reason and since there is some uncertainty about what there can be doing at lymphedema clinic next Wednesday I am get a make an appointment for next Friday here for [place] to see what we need to do for him prior to him leaving for vacation. 10/23; patient arrives in considerable pain predominantly in the upper posterior calf just distal to the popliteal fossa also in the wound anteriorly above the major wound. This is probably cellulitis and he has had this recurrently in the past. He has no open wound on the right side and he has had an Radio broadcast assistant in that area. Finally I note that he has an area on the left posterior calf which by enlarge is mostly epithelialized. This protrudes beyond the borders of the surrounding skin in the setting of dry scaly skin and  lymphedema. The patient is leaving for [facility] on Sunday. Per his longstanding pattern, he will not take his compression pumps with him predominantly out of fear that they will be stolen. He therefore asked that we put a Unna boot back on the right leg. He will also contact the wound care center in [facility] to see if they can change his dressing in the mid week. 11/3; patient returned from his vacation to [facility]. He was seen on 1 occasion at their wound care center. They did a 2 layer compression system as they did not have our 4-layer wrap. I am not completely certain what they put on the wounds. They did not change the Unna boot on the right. The patient is also seeing a lymphedema specialist physical  therapist in [place]. It appears that he has some compression sleeve for his thighs which indeed look quite a bit better than I am used to seeing. He pumps over these with his external compression pumps. 11/10; the patient has a new wound on the right medial thigh otherwise there is no open areas on the right. He has an area on the left leg posteriorly anteriorly and medially and an area over the left second toe. We have been using silver alginate. He thinks the injury on his thigh is secondary to friction from the compression sleeve he has. 11/17; the patient has a new wound on the right medial thigh last week. He thinks this is because he did not have a underlying stocking for his thigh juxta lite apparatus. He now has this. The area is fairly large and somewhat angry but I do not think he has underlying cellulitis. ooHe has a intact blister on the right anterior tibial area. ooSmall wound on the right great toe dorsally ooSmall area on the medial left calf. 11/30; the patient does not have any open areas on his right leg and we did not take his juxta lite stocking off. However he states that on Friday his compression wrap fell down lodging around his upper mid calf area. As usual this creates a lot of problems for him. He called urgently today to be seen for a nurse visit however the nurse visit turned into a provider visit because of extreme erythema and pain in the left anterior tibia extending laterally and posteriorly. The area that is problematic is extensive 10/06/2019 upon evaluation today patient actually appears to be doing poorly in regard to his left lower extremity. He Dr. Leanord Hawking did place him on doxycycline this past Monday apparently due to the fact that he was doing much worse in regard to this left leg. Fortunately the doxycycline does seem to be helping. Unfortunately we are still having a very difficult time getting his edema under any type of control in order to  anticipate discharge at some point. The only way were really able to control his lymphedema really is with compression wraps and that has only even seemingly temporary. He has been seeing a lymphedema clinic they are trying to help in this regard but still this has been somewhat frustrating in general for the patient. 10/13/19 on evaluation today patient appears to be doing excellent with regard to his right lower extremity as far as the wounds are concerned. His swelling is still quite extensive unfortunately. He is still having a lot of drainage from the thigh areas bilaterally which is unfortunate. He's been going to lymphedema clinic but again he still really does not have this edema under  control as far as his lower extremities are concern. With regard to his left lower extremity this seems to be improving and I do believe the doxycycline has been of benefit for him. He is about to complete the doxycycline. 10/20/2019 on evaluation today patient appears to be doing poorly in regard to his bilateral lower extremities. More in the right thigh he has a lot of irritation at this site unfortunately. In regard to the left lower extremity the wrap was not quite as high it appears and does seem to have caused him some trouble as well. Fortunately there is no evidence of systemic infection though he does have some blue-green drainage which has me concerned for the possibility of Pseudomonas. He tells me he is previously taking Cipro without complications and he really does not care for Levaquin however due to some of the side effects he has. He is not allergic to any medications specifically antibiotics that were aware of. 10/27/2019 on evaluation today patient actually does appear to be for the most part doing better when compared to last week's evaluation. With that being said he still has multiple open wounds over the bilateral lower extremities. He actually forgot to start taking the Cipro and states  that he still has the whole bottle. He does have several new blisters on left lower extremity today I think I would recommend he go ahead and take the Cipro based on what I am seeing at this point. 12/30-Patient comes at 1 week visit, 4 layer compression wraps on the left and Unna boot on the right, primary dressing Xtrasorb and silver alginate. Patient is taking his Cipro and has a few more days left probably 5-6, and the legs are doing better. He states he is using his compressions devices which I believe he has 11/10/2019 on evaluation today patient actually appears to be much better than last time I saw him 2 weeks ago. His wounds are significantly improved and overall I am very pleased in this regard. Fortunately there is no signs of active infection at this time. He is just a couple of days away from completing Cipro. Overall his edema is much better he has been using his lymphedema pumps which I think is also helping at this point. 11/17/2019 on evaluation today patient appears to be doing excellent in regard to his wounds in general. His legs are swollen but not nearly as much as they have been in the past. Fortunately he is tolerating the compression wraps without complication. No fevers, chills, nausea, vomiting, or diarrhea. He does have some erythema however in the distal portion of his right lower extremity specifically around the forefoot and toes there is a little bit of warmth here as well. 11/24/2019 on evaluation today patient appears to be doing well with regard to his right lower extremity I really do not see any open wounds at this point. His left lower extremity does have several open areas and his right medial thigh also is open. Other than this however overall the patient seems to be making good progress and I am very pleased at this point. 12/01/2019 on evaluation today patient appears to be doing poorly at this point in regard to his left lower extremity has several new blisters  despite the fact that we have him in compression wraps. In fact he had a 4-layer compression wrap, his upper thigh wrapped from lymphedema clinic, and a juxta light over top of the 4 layer compression wrap the lymphedema clinic applied and  despite all this he still develop blisters underneath. Obviously this does have me concerned about the fact that unfortunately despite what we are doing to try to get wounds healed he continues to have new areas arise I do not think he is ever good to be at the point where he can realistically just use wraps at home to keep things under control. Typically when we heal him it takes about 1-2 days before he is back in the clinic with severe breakdown and blistering of his lower extremities bilaterally. This is happened numerous times in the past. Unfortunately I think that we may need some help as far as overall fluid overload to kind of limit what we are seeing and get things under better control. 12/08/2019 on evaluation today patient presents for follow-up concerning his ongoing bilateral lower extremity edema. Unfortunately he is still having quite a bit of swelling the compression wraps are controlling this to some degree but he did see Dr. Rennis GoldenHilty his cardiologist I do have that available for review today as far as the appointment was concerned that was on 12/06/2019. Obviously that she has been 2 days ago. The patient states that he is only been taking the Lasix 80 mg 1 time a day he had told me previously he was taking this twice a day. Nonetheless Dr. Rennis GoldenHilty recommended this be up to 80 mg 2 times a day for the patient as he did appear to be fluid overloaded. With that being said the patient states he did this yesterday and he was unable to go anywhere or do anything due to the fact that he was constantly having to urinate. Nonetheless I think that this is still good to be something that is important for him as far as trying to get his edema under control at all  things that he is going to be able to just expect his wounds to get under control and things to be better without going through at least a period of time where he is trying to stabilize his fluid management in general and I think increasing the Lasix is likely the first step here. It was also mentioned the possibility that the patient may require metolazone. With that being said he wanted to have the patient take Lasix twice a day first and then reevaluating 2 months to see where things stand. 12/15/2019 upon evaluation today patient appears to be doing regard to his legs although his toes are showing some signs of weeping especially on the left at this point to some degree on the right. There does not appear to be any signs of active infection and overall I do feel like the compression wraps are doing well for him but he has not been able to take the Lasix at home and the increased dose that Dr. Rennis GoldenHilty recommended. He tells me that just not go to be feasible for him. Nonetheless I think in this case he should probably send a message to Dr. Rennis GoldenHilty in order to discuss options from the standpoint of possible admission to get the fluid off or otherwise going forward. 12/22/2019 upon evaluation today patient appears to be doing fairly well with regard to his lower extremities at this point. In fact he would be doing excellent if it was not for the fact that his right anterior thigh apparently had an allergic reaction to adhesive tape that he used. The wound itself that we have been monitoring actually appears to be healed. There is a lot of irritation  at this point. 12/29/2019 upon evaluation today patient appears to be doing well in regard to his lower extremities. His left medial thigh is open and somewhat draining today but this is the only region that is open the right has done much better with the treatment utilizing the steroid cream that I prescribed for him last week. Overall I am pleased in that  regard. Fortunately there is no signs of active infection at this time. No fevers, chills, nausea, vomiting, or diarrhea. 01/05/2020 upon evaluation today patient appears to be doing more poorly in regard to his right lower extremity at this point upon evaluation today. Unfortunately he continues to have issues in this regard and I think the biggest issue is controlling his edema. This obviously is not very well controlled at this point is been recommended that he use the Lasix twice a day but he has not been able to do that. Unfortunately I think this is leading to an issue where honestly he is not really able to effectively control his edema and therefore the wounds really are not doing significantly better. I do not think that he is going to be able to keep things under good control unless he is able to control his edema much better. I discussed this again in great detail with him today. 01/12/2020 good news is patient actually appears to be doing quite well today at this point. He does have an appointment with lymphedema clinic tomorrow. His legs appear healed and the toe on the left is almost completely healed. In general I am very pleased with how things stand at this point. 01/19/2020 upon evaluation today patient appears to actually be doing well in regard to his lower extremities there is nothing open at this point. Fortunately he has done extremely well more recently. Has been seeing lymphedema clinic as well. With that being said he has Velcro wraps for his lower legs as well as his upper legs. The only wound really is on his toe which is the right great toe and this is barely anything even there. With all that being said I think it is good to be appropriate today to go ahead and switch him over to the Velcro compression wraps. 01/26/2020 upon evaluation today patient appears to be doing worse with regard to his lower extremities after last week switch him to Velcro compression  wraps. Unfortunately he lasted less than 24 hours he did not have the sock portion of his Velcro wrap on the left leg and subsequently developed a blister underneath the Velcro portion. Obviously this is not good and not what we were looking for at this point. He states the lymphedema clinic did tell him to wear the wrap for 23 hours and take him off for 1 I am okay with that plan but again right now we got a get things back under control again he may have some cellulitis noted as well. 02/02/2020 upon evaluation today patient unfortunately appears to have several areas of blistering on his bilateral lower extremities today mainly on the feet. His legs do seem to be doing somewhat better which is good news. Fortunately there is no evidence of active infection at this time. No fevers, chills, nausea, vomiting, or diarrhea. 02/16/2020 upon evaluation today patient appears to be doing well at this time with regard to his legs. He has a couple weeping areas on his toes but for the most part everything is doing better and does appear to be sealed up on his  legs which is excellent news. We can continue with wrapping him at this point as he had every time we discontinue the wraps he just breaks out with new wounds. There is really no point in is going forward with this at this point. 03/08/2020 upon evaluation today patient actually appears to be doing quite well with regard to his lower extremity ulcers. He has just a very superficial and really almost nonexistent blister on the left lower extremity he has in general done very well with the compression wraps. With that being said I do not see any signs of infection at this time which is good news. 03/29/2020 upon evaluation today patient appears to be doing well with regard to his wounds currently except for where he had several new areas that opened up due to some of the wrap slipping and causing him trouble. He states he did not realize they had slipped.  Nonetheless he has a 1 area on the right and 3 new areas on the left. Fortunately there is no signs of active infection at this time which is great news. 04/05/2020 upon evaluation today patient actually appears to be doing quite well in general in regard to his legs currently. Fortunately there is no signs of active infection at this time. No fevers, chills, nausea, vomiting, or diarrhea. He tells me next week that he will actually be seen in the lymphedema clinic on Thursday at 10 AM I see him on Wednesday next week. 04/12/2020 upon evaluation today patient appears to be doing very well with regard to his lower extremities bilaterally. In fact he does not appear to have any open wounds at this point which is good news. Fortunately there is no signs of active infection at this time. No fevers, chills, nausea, vomiting, or diarrhea. 04/19/2020 upon evaluation today patient appears to be doing well with regard to his wounds currently on the bilateral lower extremities. There does not appear to be any signs of active infection at this time. Fortunately there is no evidence of systemic infection and overall very pleased at this point. Nonetheless after I held him out last week he literally had blisters the next morning already which swelled up with him being right back here in the clinic. Overall I think that he is just not can be able to be discharged with his legs the way they are he is much to volume overloaded as far as fluid is concerned and that was discussed with him today of also discussed this but should try the clinic nurse manager as well as Dr. Leanord Hawking. 04/26/2020 upon evaluation today patient appears to be doing better with regard to his wounds currently. He is making some progress and overall swelling is under good control with the compression wraps. Fortunately there is no evidence of active infection at this time. 05/10/2020 on evaluation today patient appears to be doing overall well in regard to  his lower extremities bilaterally. He is Tolerating the compression wraps without complication and with what we are seeing currently I feel like that he is making excellent progress. There is no signs of active infection at this time. Objective Constitutional Obese and well-hydrated in no acute distress. Vitals Time Taken: 10:24 AM, Height: 70 in, Weight: 380.2 lbs, BMI: 54.5, Temperature: 98.0 F, Pulse: 64 bpm, Respiratory Rate: 22 breaths/min, Blood Pressure: 179/64 mmHg. Respiratory normal breathing without difficulty. Psychiatric this patient is able to make decisions and demonstrates good insight into disease process. Alert and Oriented x 3. pleasant and  cooperative. General Notes: Upon inspection patient's wound bed actually showed signs of good epithelial tissue over the majority of the wounds and some areas appear to be healed overall I am extremely pleased with where things stand and he has very little weeping overall that is good. He tells me he has taken the Lasix and is trying elevate as much as he can he is not exercising regularly however we discussed that as well. Integumentary (Hair, Skin) Wound #172 status is Healed - Epithelialized. Original cause of wound was Blister. The wound is located on the Right T Great. The wound measures 0cm oe length x 0cm width x 0cm depth; 0cm^2 area and 0cm^3 volume. There is no tunneling or undermining noted. There is a none present amount of drainage noted. The wound margin is distinct with the outline attached to the wound base. There is no granulation within the wound bed. There is no necrotic tissue within the wound bed. Wound #174 status is Open. Original cause of wound was Blister. The wound is located on the Left,Dorsal Foot. The wound measures 0.3cm length x 0.3cm width x 0.1cm depth; 0.071cm^2 area and 0.007cm^3 volume. There is Fat Layer (Subcutaneous Tissue) Exposed exposed. There is no tunneling or undermining noted. There is a  medium amount of serosanguineous drainage noted. The wound margin is flat and intact. There is medium (34-66%) red, pink granulation within the wound bed. There is a medium (34-66%) amount of necrotic tissue within the wound bed including Adherent Slough. Wound #175 status is Open. Original cause of wound was Gradually Appeared. The wound is located on the Right,Posterior Lower Leg. The wound measures 0.7cm length x 0.6cm width x 0.1cm depth; 0.33cm^2 area and 0.033cm^3 volume. There is Fat Layer (Subcutaneous Tissue) Exposed exposed. There is no tunneling or undermining noted. There is a medium amount of serosanguineous drainage noted. The wound margin is distinct with the outline attached to the wound base. There is large (67-100%) pink, pale granulation within the wound bed. There is no necrotic tissue within the wound bed. Wound #176 status is Open. Original cause of wound was Gradually Appeared. The wound is located on the Right,Lateral Lower Leg. The wound measures 0.1cm length x 0.1cm width x 0.1cm depth; 0.008cm^2 area and 0.001cm^3 volume. There is Fat Layer (Subcutaneous Tissue) Exposed exposed. There is no tunneling or undermining noted. There is a medium amount of serous drainage noted. There is large (67-100%) pink granulation within the wound bed. There is no necrotic tissue within the wound bed. 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 There was a Four Layer Compression  Therapy Procedure by Yevonne Pax, RN. Post procedure Diagnosis Wound #174: Same as Pre-Procedure Wound #176 Pre-procedure diagnosis of Wound #176 is a Lymphedema located on the Right,Lateral Lower Leg . There was a Radio broadcast assistant Compression Therapy Procedure by Yevonne Pax, RN. Post procedure Diagnosis Wound #176: Same as Pre-Procedure Plan Follow-up Appointments: Return Appointment in 2 weeks. - with Leonard Schwartz Nurse Visit: - 1 week Dressing Change Frequency: Do not change entire dressing for one week. - both legs Skin  Barriers/Peri-Wound Care: Moisturizing lotion - both legs Wound Cleansing: May shower with protection. Primary Wound Dressing: Wound #174 Left,Dorsal Foot: Calcium Alginate with Silver Wound #175 Right,Posterior Lower Leg: Calcium Alginate with Silver Secondary Dressing: Wound #174 Left,Dorsal Foot: Dry Gauze ABD pad - as needed Wound #175 Right,Posterior Lower Leg: Dry Gauze ABD pad - as needed Edema Control: 4 layer compression: Left lower extremity Unna Boot to Right Lower Extremity Avoid standing for long periods of time Elevate legs to the level of the heart or above for 30 minutes daily and/or when sitting, a frequency of: - throughout the day Exercise regularly 1. I would recommend currently that we go ahead and initiate a continuation of treatment with a silver alginate dressing to the open wound locations. 2. I am also can recommend that we continue with a 4-layer compression to the left lower extremity and Unna boot to the right lower extremity which seems to be well for him. 3. I would also recommend he continue taking his Lasix and using his other lymphedema wraps as previously noted. We will see patient back for reevaluation in 2 weeks here in the clinic. If anything worsens or changes patient will contact our office for additional recommendations. We will perform a nurse visit between now and when I see him again. Electronic Signature(s) Signed:  05/10/2020 11:22:14 AM By: Lenda Kelp PA-C Entered By: Lenda Kelp on 05/10/2020 11:22:14 -------------------------------------------------------------------------------- SuperBill Details Patient Name: Date of Service: [name], Coltin J. 05/10/2020 Medical Record Number: [id number] Patient Account Number: [id number] Date of Birth/Sex: Treating RN: September 21, 1951 (69 y.o. [name] Primary Care Provider: Marney Setting, [name] Other Clinician: Referring Provider: Treating Provider/Extender: Lenda Kelp [facility] [name], [name] in Treatment: 224 Diagnosis Coding ICD-10 Codes Code Description [phone] Non-pressure chronic ulcer of right calf limited to breakdown of skin L97.221 Non-pressure chronic ulcer of left calf limited to breakdown of skin 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 of bilateral lower extremity I89.0 Lymphedema, not elsewhere classified E11.622 Type 2 diabetes mellitus with other skin ulcer 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, unspecified L03.116 Cellulitis of left lower limb Facility Procedures CPT4 Code: [id number] (Fa Description: cility Use Only) 29581LT - APPLY MULTLAY COMPRS LWR LT LEG 59 Modifier: 1 Quantity: CPT4 Code: [id number] (Fa Description: cility Use Only) [id number] - APPLY UNNA BOOT RT Modifier: 1 Quantity: Physician Procedures : CPT4 Code Description Modifier [id number] - WC PHYS LEVEL 3 - EST PT ICD-10 Diagnosis Description 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 skin L97.221 Non-pressure chronic ulcer of left calf limited to breakdown of  skin 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

## 2020-05-16 DIAGNOSIS — E1121 Type 2 diabetes mellitus with diabetic nephropathy: Secondary | ICD-10-CM | POA: Diagnosis not present

## 2020-05-16 DIAGNOSIS — Z794 Long term (current) use of insulin: Secondary | ICD-10-CM | POA: Diagnosis not present

## 2020-05-17 ENCOUNTER — Encounter (HOSPITAL_BASED_OUTPATIENT_CLINIC_OR_DEPARTMENT_OTHER): Payer: Medicare Other | Admitting: Physician Assistant

## 2020-05-17 ENCOUNTER — Other Ambulatory Visit: Payer: Self-pay

## 2020-05-17 ENCOUNTER — Ambulatory Visit (INDEPENDENT_AMBULATORY_CARE_PROVIDER_SITE_OTHER): Payer: Medicare Other | Admitting: Family Medicine

## 2020-05-17 DIAGNOSIS — L97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 right calf limited to breakdown of skin: Secondary | ICD-10-CM | POA: Diagnosis not present

## 2020-05-17 DIAGNOSIS — I272 Pulmonary hypertension, unspecified: Secondary | ICD-10-CM | POA: Diagnosis not present

## 2020-05-17 DIAGNOSIS — Z794 Long term (current) use of insulin: Secondary | ICD-10-CM | POA: Diagnosis not present

## 2020-05-17 DIAGNOSIS — L97221 Non-pressure chronic ulcer of left calf limited to breakdown of skin: Secondary | ICD-10-CM | POA: Diagnosis not present

## 2020-05-17 DIAGNOSIS — E11622 Type 2 diabetes mellitus with other skin ulcer: Secondary | ICD-10-CM | POA: Diagnosis not present

## 2020-05-17 DIAGNOSIS — I87333 Chronic venous hypertension (idiopathic) with ulcer and inflammation of bilateral lower extremity: Secondary | ICD-10-CM | POA: Diagnosis not present

## 2020-05-17 DIAGNOSIS — E1140 Type 2 diabetes mellitus with diabetic neuropathy, unspecified: Secondary | ICD-10-CM | POA: Diagnosis not present

## 2020-05-17 DIAGNOSIS — E1151 Type 2 diabetes mellitus with diabetic peripheral angiopathy without gangrene: Secondary | ICD-10-CM | POA: Diagnosis not present

## 2020-05-17 DIAGNOSIS — I872 Venous insufficiency (chronic) (peripheral): Secondary | ICD-10-CM | POA: Diagnosis not present

## 2020-05-17 DIAGNOSIS — I509 Heart failure, unspecified: Secondary | ICD-10-CM | POA: Diagnosis not present

## 2020-05-17 DIAGNOSIS — I110 Hypertensive heart disease with heart failure: Secondary | ICD-10-CM | POA: Diagnosis not present

## 2020-05-17 DIAGNOSIS — L03119 Cellulitis of unspecified part of limb: Secondary | ICD-10-CM | POA: Diagnosis not present

## 2020-05-17 DIAGNOSIS — I890 Lymphedema, not elsewhere classified: Secondary | ICD-10-CM | POA: Diagnosis not present

## 2020-05-17 DIAGNOSIS — L03116 Cellulitis of left lower limb: Secondary | ICD-10-CM | POA: Diagnosis not present

## 2020-05-17 DIAGNOSIS — I482 Chronic atrial fibrillation, unspecified: Secondary | ICD-10-CM | POA: Diagnosis not present
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
## 2020-05-17 NOTE — Progress Notes (Signed)
Kevin Betker Cowperis a 68 y.o.malepresents to the office today for bicillininjections, per physician's orders.  Bicillin 600,000 Unitswas administeredbilaterally in Right and Left upper outter quadtoday. Patient tolerated injection.  Patient due for follow up labs/provider appt:Yes. Date due:07/12/20, appt madeYes Next injection due: 06/07/20, appt made: yes- with provider  Misty Stanley, CMA

## 2020-05-19 NOTE — Progress Notes (Signed)
[name], [name] ([id number]) Visit Report for 05/17/2020 Arrival Information Details Patient Name: Date of Service: [name], [name] 05/17/2020 11:00 A M Medical Record Number: [id number] Patient Account Number: [id number] Date of Birth/Sex: Treating RN: 1950/12/04 (69 y.o. [name]) Yevonne Pax Primary Care Zakariye Nee: Marney Setting, [name] Other Clinician: Referring Fedor Kazmierski: Treating Dot Splinter/Extender: Lenda Kelp [name], [name] in Treatment: 225 Visit Information History Since Last Visit All ordered tests and consults were completed: No Patient Arrived: [name] Added or deleted any medications: No Arrival Time: 11:01 Any new allergies or adverse reactions: No Accompanied By: self Had a fall or experienced change in No Transfer Assistance: None activities of daily living that may affect Patient Identification Verified: Yes risk of falls: Secondary Verification Process Completed: Yes Signs or symptoms of abuse/neglect since last visito No Patient Requires Transmission-Based Precautions: No Hospitalized since last visit: No Patient Has Alerts: Yes Implantable device outside of the clinic excluding No cellular tissue based products placed in the center since last visit: Has Dressing in Place as Prescribed: Yes Has Compression in Place as Prescribed: Yes Pain Present Now: No Electronic Signature(s) Signed: 05/19/2020 3:56:02 PM By: Yevonne Pax RN Entered By: Yevonne Pax on 05/17/2020 11:01:59 -------------------------------------------------------------------------------- Compression Therapy Details Patient Name: Date of Service: [name] 05/17/2020 11:00 A M Medical Record Number: [id number] Patient Account Number: [id number] Date of Birth/Sex: Treating RN: 1951-10-07 (69 y.o. [name]) Yevonne Pax Primary Care Masie Bermingham: Marney Setting, [name] Other Clinician: Referring Kristopher Attwood: Treating Truman Aceituno/Extender: Lenda Kelp [name], [name] in Treatment: 225 Compression  Therapy Performed for Wound Assessment: Wound #174 Left,Dorsal Foot Performed By: Little Ishikawa, RN Compression Type: Four Layer Electronic Signature(s) Signed: 05/19/2020 3:56:02 PM By: Yevonne Pax RN Entered By: Yevonne Pax on 05/17/2020 12:03:42 -------------------------------------------------------------------------------- Compression Therapy Details Patient Name: Date of Service: [name], [name] 05/17/2020 11:00 A M Medical Record Number: [id number] Patient Account Number: [id number] Date of Birth/Sex: Treating RN: 1951/06/11 (69 y.o. [name]) Yevonne Pax Primary Care Aurthur Wingerter: Marney Setting, [name] Other Clinician: Referring Graciela Plato: Treating Allia Wiltsey/Extender: Lenda Kelp [facility] [name], [name] in Treatment: 225 Compression Therapy Performed for Wound Assessment: Wound #176 Right,Lateral Lower Leg Performed By: Little Ishikawa, RN Compression Type: Henriette Combs Electronic Signature(s) Signed: 05/19/2020 3:56:02 PM By: Yevonne Pax RN Entered By: Yevonne Pax on 05/17/2020 12:04:10 -------------------------------------------------------------------------------- Compression Therapy Details Patient Name: Date of Service: [name], [name] 05/17/2020 11:00 A M Medical Record Number: [id number] Patient Account Number: [id number] Date of Birth/Sex: Treating RN: Apr 29, 1951 (69 y.o. [name]) Yevonne Pax Primary Care Katherine Tout: Marney Setting, [name] Other Clinician: Referring Diella Gillingham: Treating Samanvi Cuccia/Extender: Lenda Kelp [facility] [name], [name] in Treatment: 225 Compression Therapy Performed for Wound Assessment: Wound #175 Right,Posterior Lower Leg Performed By: Little Ishikawa, RN Compression Type: Henriette Combs Electronic Signature(s) Signed: 05/19/2020 3:56:02 PM By: Yevonne Pax RN Entered By: Yevonne Pax on 05/17/2020 12:04:10 -------------------------------------------------------------------------------- Encounter Discharge Information Details Patient  Name: Date of Service: [name], [name] 05/17/2020 11:00 A M Medical Record Number: [id number] Patient Account Number: [id number] Date of Birth/Sex: Treating RN: [birth year] (69 y.o. [name]) Yevonne Pax Primary Care Annarae Macnair: Marney Setting, [name] Other Clinician: Referring Loy Little: Treating Oscar Hank/Extender: Lenda Kelp [name], [name] in Treatment: 225 Encounter Discharge Information Items Discharge Condition: Stable Ambulatory Status: Walker Discharge Destination: Home Transportation: Private Auto Accompanied By: self Schedule Follow-up Appointment: Yes Clinical Summary of Care: Patient Declined Electronic Signature(s) Signed: 05/19/2020 3:56:02 PM By: Yevonne Pax RN Entered By: Yevonne Pax on  05/17/2020 12:06:30 -------------------------------------------------------------------------------- Patient/Caregiver Education Details Patient Name: Date of Service: [name], [name] 7/14/2021andnbsp11:00 A M Medical Record Number: [id number] Patient Account Number: [id number] Date of Birth/Gender: Treating RN: 05-07-1951 (69 y.o. [name]) Yevonne Pax Primary Care Physician: Marney Setting, [name] Other Clinician: Referring Physician: Treating Physician/Extender: Suzy Bouchard, [name] in Treatment: 225 Education Assessment Education Provided To: Patient Education Topics Provided Venous: Methods: Explain/Verbal Responses: State content correctly Electronic Signature(s) Signed: 05/19/2020 3:56:02 PM By: Yevonne Pax RN Entered By: Yevonne Pax on 05/17/2020 12:06:10 -------------------------------------------------------------------------------- Wound Assessment Details Patient Name: Date of Service: [name], [name] 05/17/2020 11:00 A M Medical Record Number: [id number] Patient Account Number: [id number] Date of Birth/Sex: Treating RN: 02-09-1951 (69 y.o. [name]) Yevonne Pax Primary Care Selmer [name]: Marney Setting, [name] Other Clinician: Referring Sutton Plake: Treating  Birdie Fetty/Extender: Lenda Kelp [name], [name] in Treatment: 225 Wound Status Wound Number: [id number] Primary Etiology: Lymphedema Wound Location: Left, Dorsal Foot Wound Status: Open Wounding Event: Blister Date Acquired: 04/19/2020 Weeks Of Treatment: 4 Clustered Wound: No Wound Measurements Length: (cm) 0.3 Width: (cm) 0.3 Depth: (cm) 0.1 Area: (cm) 0.071 Volume: (cm) 0.007 % Reduction in Area: 98.9% % Reduction in Volume: 98.9% Wound Description Classification: Full Thickness Without Exposed Support Structur es Treatment Notes Wound #174 (Left, Dorsal Foot) 1. Cleanse With Wound Cleanser Soap and water 3. Primary Dressing Applied Calcium Alginate Ag 4. Secondary Dressing Dry Gauze 6. Support Layer Applied 4 layer compression wrap Notes Scientist, research (physical sciences)) Signed: 05/19/2020 3:56:02 PM By: Yevonne Pax RN Entered By: Yevonne Pax on 05/17/2020 11:02:56 -------------------------------------------------------------------------------- Wound Assessment Details Patient Name: Date of Service: [name], [name] 05/17/2020 11:00 A M Medical Record Number: [id number] Patient Account Number: [id number] Date of Birth/Sex: Treating RN: [birth year] (69 y.o. [name]) Yevonne Pax Primary Care Korine Winton: Marney Setting, [name] Other Clinician: Referring Jennetta Flood: Treating Kada Friesen/Extender: Lenda Kelp [name], [name] in Treatment: 225 Wound Status Wound Number: 175 Primary Etiology: Lymphedema Wound Location: Right, Posterior Lower Leg Wound Status: Open Wounding Event: Gradually Appeared Date Acquired: 04/26/2020 Weeks Of Treatment: 3 Clustered Wound: No Wound Measurements Length: (cm) 0.7 Width: (cm) 0.6 Depth: (cm) 0.1 Area: (cm) 0.33 Volume: (cm) 0.033 % Reduction in Area: 82.5% % Reduction in Volume: 82.4% Wound Description Classification: Full Thickness Without Exposed Support Structur es Treatment Notes Wound #175 (Right, Posterior  Lower Leg) 1. Cleanse With Wound Cleanser Soap and water 3. Primary Dressing Applied Calcium Alginate Ag 4. Secondary Dressing Dry Gauze 6. Support Layer Applied Kerlix/Coban Theatre manager) Signed: 05/19/2020 3:56:02 PM By: Yevonne Pax RN Entered By: Yevonne Pax on 05/17/2020 11:02:56 -------------------------------------------------------------------------------- Wound Assessment Details Patient Name: Date of Service: [name], [name] 05/17/2020 11:00 A M Medical Record Number: [id number] Patient Account Number: [id number] Date of Birth/Sex: Treating RN: 1951/01/18 (69 y.o. [name]) Yevonne Pax Primary Care Deshea Pooley: Marney Setting, [name] Other Clinician: Referring Tanay Massiah: Treating Anastazja Isaac/Extender: Lenda Kelp [name], [name] in Treatment: 225 Wound Status Wound Number: 176 Primary Etiology: Lymphedema Wound Location: Right, Lateral Lower Leg Wound Status: Open Wounding Event: Gradually Appeared Date Acquired: 05/03/2020 Weeks Of Treatment: 2 Clustered Wound: No Wound Measurements Length: (cm) 0.1 Width: (cm) 0.1 Depth: (cm) 0.1 Area: (cm) 0.008 Volume: (cm) 0.001 % Reduction in Area: 97.2% % Reduction in Volume: 96.4% Wound Description Classification: Full Thickness Without Exposed Support Structur es Treatment Notes Wound #176 (Right, Lateral Lower Leg) 1. Cleanse With Wound Cleanser Soap and water 3. Primary Dressing Applied Calcium Alginate Ag 4.  Secondary Dressing Dry Gauze 6. Support Layer Applied Kerlix/Coban Theatre manager) Signed: 05/19/2020 3:56:02 PM By: Yevonne Pax RN Entered By: Yevonne Pax on 05/17/2020 11:02:56 -------------------------------------------------------------------------------- Vitals Details Patient Name: Date of Service: [name], [name] 05/17/2020 11:00 A M Medical Record Number: [id number] Patient Account Number: [id number] Date of Birth/Sex: Treating RN: Oct 17, 1951 (69 y.o.  [name]) Yevonne Pax Primary Care Ron Junco: Marney Setting, [name] Other Clinician: Referring Carmelia Tiner: Treating Kellan Raffield/Extender: Lenda Kelp [name], [name] in Treatment: 225 Vital Signs Time Taken: 11:02 Temperature (F): 98.4 Height (in): 70 Pulse (bpm): 71 Weight (lbs): 380.2 Respiratory Rate (breaths/min): 20 Body Mass Index (BMI): 54.5 Blood Pressure (mmHg): 169/72 Reference Range: 80 - 120 mg / dl Electronic Signature(s) Signed: 05/19/2020 3:56:02 PM By: Yevonne Pax RN Entered By: Yevonne Pax on 05/17/2020 11:02:22

## 2020-05-22 ENCOUNTER — Encounter: Payer: Self-pay | Admitting: Family Medicine

## 2020-05-22 ENCOUNTER — Other Ambulatory Visit: Payer: Self-pay

## 2020-05-22 MED ORDER — BD PEN NEEDLE SHORT U/F 31G X 8 MM MISC: 3 refills | Status: DC

## 2020-05-24 ENCOUNTER — Other Ambulatory Visit: Payer: Self-pay

## 2020-05-24 ENCOUNTER — Encounter (HOSPITAL_BASED_OUTPATIENT_CLINIC_OR_DEPARTMENT_OTHER): Payer: Medicare Other | Admitting: Physician Assistant

## 2020-05-24 DIAGNOSIS — L97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 right calf limited to breakdown of skin: Secondary | ICD-10-CM | POA: Diagnosis not present

## 2020-05-24 DIAGNOSIS — I87333 Chronic venous hypertension (idiopathic) with ulcer and inflammation of bilateral lower extremity: Secondary | ICD-10-CM | POA: Diagnosis not present

## 2020-05-24 DIAGNOSIS — I272 Pulmonary hypertension, unspecified: Secondary | ICD-10-CM | POA: Diagnosis not present

## 2020-05-24 DIAGNOSIS — I509 Heart failure, unspecified: Secondary | ICD-10-CM | POA: Diagnosis not present

## 2020-05-24 DIAGNOSIS — L03116 Cellulitis of left lower limb: Secondary | ICD-10-CM | POA: Diagnosis not present

## 2020-05-24 DIAGNOSIS — Z794 Long term (current) use of insulin: Secondary | ICD-10-CM | POA: Diagnosis not present

## 2020-05-24 DIAGNOSIS — I872 Venous insufficiency (chronic) (peripheral): Secondary | ICD-10-CM | POA: Diagnosis not present

## 2020-05-24 DIAGNOSIS — E1140 Type 2 diabetes mellitus with diabetic neuropathy, unspecified: Secondary | ICD-10-CM | POA: Diagnosis not present

## 2020-05-24 DIAGNOSIS — I110 Hypertensive heart disease with heart failure: Secondary | ICD-10-CM | POA: Diagnosis not present

## 2020-05-24 DIAGNOSIS — I890 Lymphedema, not elsewhere classified: Secondary | ICD-10-CM | POA: Diagnosis not present

## 2020-05-24 DIAGNOSIS — E11622 Type 2 diabetes mellitus with other skin ulcer: Secondary | ICD-10-CM | POA: Diagnosis not present

## 2020-05-24 DIAGNOSIS — E1151 Type 2 diabetes mellitus with diabetic peripheral angiopathy without gangrene: Secondary | ICD-10-CM | POA: Diagnosis not present

## 2020-05-24 DIAGNOSIS — L97221 Non-pressure chronic ulcer of left calf limited to breakdown of skin: Secondary | ICD-10-CM | POA: Diagnosis not present

## 2020-05-24 DIAGNOSIS — I482 Chronic atrial fibrillation, unspecified: Secondary | ICD-10-CM | POA: Diagnosis not present

## 2020-05-24 NOTE — Progress Notes (Addendum)
BIRNEY, BELSHE ([id number]) Visit Report for 05/24/2020 Chief Complaint Document Details Patient Name: Date of Service: CO KEYLEN, ECKENRODE 05/24/2020 10:30 A M Medical Record Number: [id number] Patient Account Number: [id number] Date of Birth/Sex: Treating RN: 1950/12/25 (69 y.o. [name] Primary Care Provider: Marney Setting, [name] Other Clinician: Referring Provider: Treating Provider/Extender: Lenda Kelp [facility] [name], [name] in Treatment: 226 Information Obtained from: Patient Chief Complaint patient is here for evaluation venous/lymphedema weeping Electronic Signature(s) Signed: 05/24/2020 11:08:42 AM By: Lenda Kelp PA-C Entered By: Lenda Kelp on 05/24/2020 11:08:42 -------------------------------------------------------------------------------- HPI Details Patient Name: Date of Service: [name], Traivon J. 05/24/2020 10:30 A M Medical Record Number: [id number] Patient Account Number: [id number] Date of Birth/Sex: Treating RN: 05-May-1951 (69 y.o. [name] Primary Care Provider: Marney Setting, [name] Other Clinician: Referring Provider: Treating Provider/Extender: Lenda Kelp [facility] [name], [name] in Treatment: 226 History of Present Illness HPI Description: Referred by PCP for consultation. Patient has long standing history of BLE venous stasis, no prior ulcerations. At beginning of month, developed cellulitis and weeping. Received IM Rocephin followed by Keflex and resolved. Wears compression stocking, appr 6 months old. Not sure strength. No present drainage. 01/22/16 this is a patient who is a type II diabetic on insulin. 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 He tells me he religiously wears pressure stockings of uncertain strength. He was here with weeping edema about 8 months ago but did not have an open wound. Roughly a month ago he had a reopening on his bilateral legs. He is been using bandages and Neosporin. He  does not complain of pain. 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 Last BUN/creatinine I have is from 11/20/15 at 13 and 1.0 respectively 01/29/16; patient arrives today having tolerated the Profore wrap. He brought in his stockings and these are 18 mmHg stockings he bought from [place]. The compression here is likely inadequate. He does not complain of pain or excessive drainage she has no systemic symptoms. The wound on the right looks improved as does the one on the left although one on the left is more substantial with still tissue at risk below the actual wound area on the bilateral posterior calf 02/05/16; patient arrives with poor edema control. He states that we did put a 4 layer compression on it last week. No weight appear 5 this. 02/12/16; the area on the posterior right Has healed. The left Has a substantial wound that has necrotic surface eschar that requires a debridement with a curette. 02/16/16;the patient called or a Nurse visit secondary to increased swelling. He had been in earlier in the week with his right leg healed. He was transitioned to is on pressure stocking on the right leg with the only open wound on the left, a substantial area on the left posterior calf. Note he has a history of severe lower extremity edema, he has a history of chronic atrial fibrillation but not heart failure per my notes but I'll need to research this. He is not complaining of chest pain shortness of breath or orthopnea. The intake nurse noted blisters on the previously closed right leg 02/19/16; this is the patient's regular visit day. I see him on Friday with escalating edema new wounds on the right leg and clear signs of at least right ventricular heart failure. I increased his Lasix to  40 twice a day. He is returning currently in follow-up. States he is noticed a decrease in that the  edema 02/26/16 patient's legs have much less edema. There is nothing really open on the right leg. The left leg has improved condition of the large superficial wound on the posterior left leg 03/04/16; edema control is very much better. The patient's right leg wounds have healed. On the left leg he continues to have severe venous inflammation on the posterior aspect of the left leg. There is no tenderness and I don't think any of this is cellulitis. 03/11/16; patient's right leg is married healed and he is in his own stocking. The patient's left leg has deteriorated somewhat. There is a lot of erythema around the wound on the posterior left leg. There is also a significant rim of erythema posteriorly just above where the wrap would've ended there is a new wound in this location and a lot of tenderness. Can't rule out cellulitis in this area. 03/15/16; patient's right leg remains healed and he is in his own stocking. The patient's left leg is much better than last review. His major wound on the posterior aspect of his left Is almost fully epithelialized. He has 3 small injuries from the wraps. Really. Erythema seems a lot better on antibiotics 03/18/16; right leg remains healed and he is in his own stocking. The patient's left leg is much better. The area on the posterior aspect of the left calf is fully epithelialized. His 3 small injuries which were wrap injuries on the left are improved only one seems still open his erythema has resolved 03/25/16; patient's right leg remains healed and he is in his own stocking. There is no open area today on the left leg posterior leg is completely closed up. His wrap injuries at the superior aspect of his leg are also resolved. He looks as though he has some irritation on the dorsal ankle but this is fully epithelialized without evidence of infection. 03/28/16; we discharged this patient on Monday. Transitioned him into his own stocking. There were problems almost  immediately with uncontrolled swelling weeping edema multiple some of which have opened. He does not feel systemically unwell in particular no chest pain no shortness of breath and he does not feel 04/08/16; the edema is under better control with the Profore light wrap but he still has pitting edema. There is one large wound anteriorly 2 on the medial aspect of his left leg and 3 small areas on the superior posterior calf. Drainage is not excessive he is tolerating a Profore light well 04/15/16; put a Profore wrap on him last week. This is controlled is edema however he had a lot of pain on his left anterior foot most of his wounds are healed 04/22/16 once again the patient has denuded areas on the left anterior foot which he states are because his wrap slips up word. He saw his primary physician today is on Lasix 40 twice a day and states that he his weight is down 20 pounds over the last 3 months. 04/29/16: Much improved. left anterior foot much improved. He is now on Lasix 80 mg per day. Much improved edema control 05/06/16; I was hoping to be able to discharge him today however once again he has blisters at a low level of where the compression was placed last week mostly on his left lateral but also his left medial leg and a small area on the anterior part of the left foot. 05/09/16;  apparently the patient went home after his appointment on 7/4 later in the evening developing pain in his upper medial thigh together with subjective fever and chills although his temperature was not taken. The pain was so intense he felt he would probably have to call 911. However he then remembered that he had leftover doxycycline from a previous round of antibiotics and took these. By the next morning he felt a lot better. He called and spoke to one of our nurses and I approved doxycycline over the phone thinking that this was in relation to the wounds we had previously seen although they were definitely were not. The  patient feels a lot better old fever no chills he is still working. Blood sugars are reasonably controlled 05/13/16; patient is back in for review of his cellulitis on his anterior medial upper thigh. He is taking doxycycline this is a lot better. Culture I did of the nodular area on the dorsal aspect of his foot grew MRSA this also looks a lot better. 05/20/16; the patient is cellulitis on the medial upper thigh has resolved. All of his wound areas including the left anterior foot, areas on the medial aspect of the left calf and the lateral aspect of the calf at all resolved. He has a new blister on the left dorsal foot at the level of the fourth toe this was excised. No evidence of infection 05/27/16; patient continues to complain weeping edema. He has new blisterlike wounds on the left anterior lateral and posterior lateral calf at the top of his wrap levels. The area on his left anterior foot appears better. He is not complaining of fever, pain or pruritus in his feet. 05/30/16; the patient's blisters on his left anterior leg posterior calf all look improved. He did not increase the Lasix 100 mg as I suggested because he was going to run out of his 40 mg tablets. He is still having weeping edema of his toes 06/03/16; I renewed his Lasix at 80 mg once a day as he was about to run out when I last saw him. He is on 80 mg of Lasix now. I have asked him to cut down on the excessive amount of water he was drinking and asked him to drink according to his thirst mechanisms 06/12/2016 -- was seen 2 days ago and was supposed to wear his compression stockings at home but he is developed lymphedema and superficial blisters on the left lower extremity and hence came in for a review 06/24/16; the remaining wound is on his left anterior leg. He still has edema coming from between his toes. There is lymphedema here however his edema is generally better than when I last saw this. He has a history of atrial fibrillation  but does not have a known history of congestive heart failure nevertheless I think he probably has this at least on a diastolic basis. 07/01/16 I reviewed his echocardiogram from January 2017. This was essentially normal. He did not have LVH, EF of 55-60%. His right ventricular function was normal although he did have trivial tricuspid and pulmonic regurgitation. This is not audible on exam however. I increased his Lasix to do massive edema in his legs well above his knees I think in early July. He was also drinking an excessive amount of water at the time. 07/15/16; missed his appointment last week because of the Labor Day holiday on Monday. He could not get another appointment later in the week. Started to feel the wrap  digging in superiorly so we remove the top half and the bottom half of his wrap. He has extensive erythema and blistering superiorly in the left leg. Very tender. Very swollen. Edema in his foot with leaking edema fluid. He has not been systemically unwell 07/22/16; the area on the left leg laterally required some debridement. The medial wounds look more stable. His wrap injury wounds appear to have healed. Edema and his foot is better, weeping edema is also better. He tells me he is meeting with the supplier of the external compression pumps at work 08/05/16; the patient was on vacation last week in [facility]. His wrap is been on for an extended period of time. Also over the weekend he developed an extensive area of tender erythema across his anterior medial thigh. He took to doxycycline yesterday that he had leftover from a previous prescription. The patient complains of weeping edema coming out of his toes 08/08/16; I saw this patient on 10/2. He was tender across his anterior thigh. I put him on doxycycline. He returns today in follow-up. He does not have any open wounds on his lower leg, he still has edema weeping into his toes. 08/12/16; patient was seen back urgently today to  follow-up for his extensive left thigh cellulitis/erysipelas. He comes back with a lot less swelling and erythema pain is much better. I believe I gave him Augmentin and Cipro. His wrap was cut down as he stated a roll down his legs. He developed blistering above the level of the wrap that remained. He has 2 open blisters and 1 intact. 08/19/16; patient is been doing his primary doctor who is increased his Lasix from 40-80 once a day or 80 already has less edema. Cellulitis has remained improved in the left thigh. 2 open areas on the posterior left calf 08/26/16; he returns today having new open blisters on the anterior part of his left leg. He has his compression pumps but is not yet been shown how to use some vital representative from the supplier. 09/02/16 patient returns today with no open wounds on the left leg. Some maceration in his plantar toes 09/10/2016 -- Dr. Leanord Hawking had recently discharged him on 09/02/2016 and he has come right back with redness swelling and some open ulcers on his left lower extremity. He says this was caused by trying to apply his compression stockings and he's been unable to use this and has not been able to use his lymphedema pumps. He had some doxycycline leftover and he has started on this a few days ago. 09/16/16; there are no open wounds on his leg on the left and no evidence of cellulitis. 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 He does not complain of symptoms here. I am not clear use using his external compression pumps. 09/23/16; I have not seen this patient in 2 weeks. He canceled his appointment 10 days ago as he was going on vacation. He tells me that on Monday he noticed a large area on his posterior left leg which is been draining copiously and is reopened into a large wound. He is been using ABDs and the external part of his juxtalite, according to our nurse this was not on properly. 10/07/16;  Still a substantial area on the posterior left leg. Using silver alginate 10/14/16; in general better although there is still open area which looks healthy. Still using silver alginate. He reminds me that this happen before he  left for [facility]. T oday while he was showering in the morning. He had been using his juxtalite's 10/21/16; the area on his posterior left leg is fully epithelialized. However he arrives today with a large area of tender erythema in his medial and posterior left thigh just above the knee. I have marked the area. Once again he is reluctant to consider hospitalization. I treated him with oral antibiotics in the past for a similar situation with resolution I think with doxycycline however this area it seems more extensive to me. He is not complaining of fever but does have chills and says states he is thirsty. His blood sugar today was in the 140s at home 10/25/16 the area on his posterior left leg is fully epithelialized although there is still some weeping edema. The large area of tenderness and erythema in his medial and posterior left thigh is a lot less tender although there is still a lot of swelling in this thigh. He states he feels a lot better. He is on doxycycline and Augmentin that I started last week. This will continued until Tuesday, December 26. I have ordered a duplex ultrasound of the left thigh rule out DVT whether there is an abscess something that would need to be drained I would also like to know. 11/01/16; he still has weeping edema from a not fully epithelialized area on his left posterior calf. Most of the rest of this looks a lot better. He has completed his antibiotics. His thigh is a lot better. Duplex ultrasound did not show a DVT in the thigh 11/08/16; he comes in today with more Denuded surface epithelium from the posterior aspect of his calf. There is no real evidence of cellulitis. The superior aspect of his wrap appears to have put quite an  indentation in his leg just below the knee and this may have contributed. He does not complain of pain or fever. We have been using silver alginate as the primary dressing. The area of cellulitis in the right thigh has totally resolved. He has been using his compression stockings once a week 11/15/16; the patient arrives today with more loss of epithelium from the posterior aspect of his left calf. He now has a fairly substantial wound in this area. The reason behind this deterioration isn't exactly clear although his edema is not well controlled. He states he feels he is generally more swollen systemically. He is not complaining of chest pain shortness of breath fever. T me he has an appointment with his primary physician in early February. He is on 80 mg of oral ells Lasix a day. He claims compliance with the external compression pumps. He is not having any pain in his legs similar to what he has with his recurrent cellulitis 11/22/16; the patient arrives a follow-up of his large area on his left lateral calf. This looks somewhat better today. He came in earlier in the week for a dressing change since I saw him a week ago. He is not complaining of any pain no shortness of breath no chest pain 11/28/16; the patient arrives for follow-up of his large area on the left lateral calf this does not look better. In fact it is larger weeping edema. The surface of the wound does not look too bad. We have been using silver alginate although I'm not certain that this is a dressing issue. 12/05/16; again the patient follows up for a large wound on the left lateral and left posterior calf this does not  look better. There continues to be weeping edema necrotic surface tissue. More worrisome than this once again there is erythema below the wound involving the distal Achilles and heel suggestive of cellulitis. He is on his feet working most of the day of this is not going well. We are changing his dressing twice a week to  facilitate the drainage. 12/12/16; not much change in the overall dimensions of the large area on the left posterior calf. This is very inflamed looking. I gave him an. Doxycycline last week does not really seem to have helped. He found the wrap very painful indeed it seems to of dog into his legs superiorly and perhaps around the heel. He came in early today because the drainage had soaked through his dressings. 12/19/16- patient arrives for follow-up evaluation of his left lower extremity ulcers. He states that he is using his lymphedema pumps once daily when there is "no drainage". He admits to not using his lipedema pumps while under current treatment. His blood sugars have been consistently between 150-200. 12/26/16; the patient is not using his compression pumps at home because of the wetness on his feet. I've advised him that I think it's important for him to use this daily. He finds his feet too wet, he can put a plastic bag over his legs while he is in the pumps. Otherwise I think will be in a vicious circle. We are using silver alginate to the major area on his left posterior calf 01/02/17; the patient's posterior left leg has further of all into 3 open wounds. All of them covered with a necrotic surface. He claims to be using his compression pumps once a day. His edema control is marginal. Continue with silver alginate 01/10/17; the patient's left posterior leg actually looks somewhat better. There is less edema, less erythema. Still has 3 open areas covered with a necrotic surface requiring debridement. He claims to be using his compression pumps once a day his edema control is better 01/17/17; the patient's left posterior calf look better last week when I saw him and his wrap was changed 2 days ago. He has noted increasing pain in the left heel and arrives today with much larger wounds extensive erythema extending down into the entire heel area especially tender medially. He is not  systemically unwell CBGs have been controlled no fever. Our intake nurse showed me limegreen drainage on his AVD pads. 01/24/17; his usual this patient responds nicely to antibiotics last week giving him Levaquin for presumed Pseudomonas. The whole entire posterior part of his leg is much better much less inflamed and in the case of his Achilles heel area much less tender. He has also had some epithelialization posteriorly there are still open areas here and still draining but overall considerably better 01/31/17- He has continue to tolerate the compression wraps. he states that he continues to use the lymphedema pumps daily, and can increase to twice daily on the weekends. He is voicing no complaints or concerns regarding his LLE ulcers 02/07/17-he is here for follow-up evaluation. He states that he noted some erythema to the left medial and anterior thigh, which he states is new as of yesterday. He is concerned about recurrent cellulitis. He states his blood sugars have been slightly elevated, this morning in the 180s 02/14/17; he is here for follow-up evaluation. When he was last here there was erythema superiorly from his posterior wound in his anterior thigh. He was prescribed Levaquin however a culture of the wound surface  grew MRSA over the phone I changed him to doxycycline on Monday and things seem to be a lot better. 02/24/17; patient missed his appointment on Friday therefore we changed his nurse visit into a physician visit today. Still using silver alginate on the large area of the posterior left thigh. He isn't new area on the dorsal left second toe 03/03/17; actually better today although he admits he has not used his external compression pumps in the last 2 days or so because of work responsibilities over the weekend. 03/10/17; continued improvement. External compression pumps once a day almost all of his wounds have closed on the posterior left calf. Better edema control 03/17/17; in general  improved. He still has 3 small open areas on the lateral aspect of his left leg however most of the area on the posterior part of his leg is epithelialized. He has better edema control. He has an ABD pad under his stocking on the right anterior lower leg although he did not let us look at that today. 03/24/17; patient arrives back in clinic today with no open areas however there are areas on the posterior left calf and anterior left calf that are less than 100% epithelialized. His edema is well controlled in the left lower leg. There is some pitting edema probably lymphedema in the left upper thigh. He uses compression pumps at home once per day. I tried to get him to do this twice a day although he is very reticent. 04/01/2017 -- for the last 2 days he's had significant redness, tenderness and weeping and came in for an urgent visit today. 04/07/17; patient still has 6 more days of doxycycline. He was seen by Dr. Meyer Russel last Wednesday for cellulitis involving the posterior aspect, lateral aspect of his Involving his heel. For the most part he is better there is less erythema and less weeping. He has been on his feet for 12 hours 2 over the weekend. Using his compression pumps once a day 04/14/17 arrives today with continued improvement. Only one area on the posterior left calf that is not fully epithelialized. He has intense bilateral venous inflammation associated with his chronic venous insufficiency disease and secondary lymphedema. We have been using silver alginate to the left posterior calf wound In passing he tells [place] today that the right leg but we have not seen in quite some time has an open area on it but he doesn't want [place] to look at this today states he will show this to [place] next week. 04/21/17; there is no open area on his left leg although he still reports some weeping edema. He showed [place] his right leg today which is the first time we've seen this leg in a long time. He has a large area of  open wound on the right leg anteriorly healthy granulation. Quite a bit of swelling in the right leg and some degree of venous inflammation. He told us about the right leg in passing last week but states that deterioration in the right leg really only happened over the weekend 04/28/17; there is no open area on the left leg although there is an irritated part on the posterior which is like a wrap injury. The wound on the right leg which was new from last week at least to [place] is a lot better. 05/05/17; still no open area on the left leg. Patient is using his new compression stocking which seems to be doing a good job of controlling the edema. He states he  is using his compression pumps once per day. The right leg still has an open wound although it is better in terms of surface area. Required debridement. A lot of pain in the posterior right Achilles marked tenderness. Usually this type of presentation this patient gives concern for an active cellulitis 05/12/17; patient arrives today with his major wound from last week on the right lateral leg somewhat better. Still requiring debridement. He was using his compression stocking on the left leg however that is reopened with superficial wounds anteriorly he did not have an open wound on this leg previously. He is still using his juxta light's once daily at night. He cannot find the time to do this in the morning as he has to be at work by 7 AM 05/19/17; right lateral leg wound looks improved. No debridement required. The concerning area is on the left posterior leg which appears to almost have a subcutaneous hemorrhagic component to it. We've been using silver alginate to all the wounds 05/26/17; the right lateral leg wound continues to look improved. However the area on the left posterior calf is a tightly adherent surface. [place] using silver alginate. Because of the weeping edema in his legs there is very little good alternatives. 06/02/17; the patient left  here last week looking quite good. Major wound on the left posterior calf and a small one on the right lateral calf. Both of these look satisfactory. He tells me that by Wednesday he had noted increased pain in the left leg and drainage. He called on Thursday and Friday to get an appointment here but we were blocked. He did not go to urgent care or his primary physician. He thinks he had a fever on Thursday but did not actually take his temperature. He has not been using his compression pumps on the left leg because of pain. I advised him to go to the emergency room today for IV antibiotics for stents of left leg cellulitis but he has refused I have asked him to take 2 days off work to keep his leg elevated and he has refused this as well. In view of this I'm going to call him and Augmentin and doxycycline. 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 However after a detailed discussion regarding the risk benefits and alternatives he refuses to get admitted to the hospital. With no other recourse I will continue him on oral antibiotics as before and hopefully he'll have his infectious disease consultation this week. 06/16/2017 -- the patient was seen today by the nurse practitioner at infectious disease Ms. Dixon. 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 He has also been advise Lotrimin cream locally. she also had wise IV antibiotics for his condition if it flares up 06/23/17; patient arrives today with drainage bilaterally although the remaining wound on the left posterior calf after  cleaning up today "highlighter yellow drainage" did not look too bad. Unfortunately he has had breakdown on the right anterior leg [previously this leg had not been open and he is using a black stocking] he went  to see infectious disease and is been put on clindamycin 150 mg daily, I did not verify the dose although I'm not familiar with using clindamycin in this dosing range, perhaps for prophylaxisoo 06/27/17; I brought this patient back today to follow-up on the wound deterioration on the right lower leg together with surrounding cellulitis. I started him on doxycycline 4 days ago. This area looks better however he comes in today with intense cellulitis on the medial part of his left thigh. This is not have a wound in this area. Extremely tender. We've been using silver alginate to the wounds on the right lower leg left lower leg with bilateral 4 layer compression he is using his external compression pumps once a day 07/04/17; patient's left medial thigh cellulitis looks better. He has not been using his compression pumps as his insert said it was contraindicated with cellulitis. His right leg continues to make improvements all the wounds are still open. We only have one remaining wound on the left posterior calf. Using silver alginate to all open areas. He is on doxycycline which I started a week ago and should be finishing I gave him Augmentin after Thursday's visit for the severe cellulitis on the left medial thigh which fortunately looks better 07/14/17; the patient's left medial thigh cellulitis has resolved. The cellulitis in his right lower calf on the right also looks better. All of his wounds are stable to improved we've been using silver alginate he has completed the antibiotics I have given him. He has clindamycin 150 mg once a day prescribed by infectious disease for prophylaxis, I've advised him to start this now. We have been using bilateral Unna boots over silver alginate to the wound  areas 07/21/17; the patient is been to see infectious disease who noted his recurrent problems with cellulitis. He was not able to tolerate prophylactic clindamycin therefore he is on amoxicillin 500 twice a day. He also had a second daily dose of Lasix added By Dr. Oneta Rack but he is not taking this. Nor is he being completely compliant with his compression pumps a especially not this week. He has 2 remaining wounds one on the right posterior lateral lower leg and one on the left posterior medial lower leg. 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 The patient has Unna boots bilaterally. 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 He has bilateral Unna boots for compression and silver alginate to his wounds. Arrives today with his legs looking as good as I have seen him in quite some time. Not surprisingly his wounds look better as well with improvement on the right lateral leg venous insufficiency wound and also the left medial leg. He is still using the compression pumps once a day 08/11/17; both legs appear to be doing better wounds on the right lateral and left medial legs look better. Skin on the right leg quite good. He is been using silver alginate as the primary dressing. I'm going to use Anasept gel calcium alginate and maintain all the secondary dressings 08/18/17; the patient continues to actually do quite well. The area on his right lateral leg is just about closed the left medial also looks better although it is still moist in this area. His edema is well controlled we have been using Anasept gel  with calcium alginate and the usual secondary dressings, 4 layer compression and once daily use of his compression pumps "always been able to manage 09/01/17; the patient  continues to do reasonably well in spite of his trip to [place]. The area on the right lateral leg is epithelialized. Left is much better but still open. He has more edema and more chronic erythema on the left leg [venous inflammation] 09/08/17; he arrives today with no open wound on the right lateral leg and decently controlled edema. Unfortunately his left leg is not nearly as in his good situation as last week.he apparently had increasing edema starting on Saturday. He edema soaked through into his foot so used a plastic bag to walk around his home. The area on the medial right leg which was his open area is about the same however he has lost surface epithelium on the left lateral which is new and he has significant pain in the Achilles area of the left foot. He is already on amoxicillin chronically for prophylaxis of cellulitis in the left leg 09/15/17; he is completed a week of doxycycline and the cellulitis in the left posterior leg and Achilles area is as usual improved. He still has a lot of edema and fluid soaking through his dressings. There is no open wound on the right leg. He saw infectious disease NP today 09/22/17;As usual 1 we transition him from our compression wraps to his stockings things did not go well. He has several small open areas on the right leg. He states this was caused by the compression wrap on his skin although he did not wear this with the stockings over them. He has several superficial areas on the left leg medially laterally posteriorly. He does not have any evidence of active cellulitis especially involving the left Achilles The patient is traveling from [facility] Saturday going to [facility]. He states he isn't attempting to get an appointment with a heel objects wound center there to change his dressings. I am not completely certain whether this will work 10/06/17; the patient came in on Friday for a nurse visit and the nurse reported that his legs actually look  quite good. He arrives in clinic today for his regular follow-up visit. He has a new wound on his left third toe over the PIP probably caused by friction with his footwear. He has small areas on the left leg and a very superficial but epithelialized area on the right anterior lateral lower leg. Other than that his legs look as good as I've seen him in quite some time. We have been using silver alginate Review of systems; no chest pain no shortness of breath other than this a 10 point review of systems negative 10/20/17; seen by Dr. Meyer Russel last week. He had taken some antibiotics [doxycycline] that he had left over. Dr. Meyer Russel thought he had candida infection and declined to give him further antibiotics. He has a small wound remaining on the right lateral leg several areas on the left leg including a larger area on the left posterior several left medial and anterior and a small wound on the left lateral. The area on the left dorsal third toe looks a lot better. ROS; Gen.; no fever, respiratory no cough no sputum Cardiac no chest pain other than this 10 point review of system is negative 10/30/17; patient arrives today having fallen in the bathtub 3 days ago. It took him a while to get up. He has pain and maceration in the  wounds on his left leg which have deteriorated. He has not been using his pumps he also has some maceration on the right lateral leg. 11/03/17; patient continues to have weeping edema especially in the left leg. This saturates his dressings which were just put on on 12/27. As usual the doxycycline seems to take care of the cellulitis on his lower leg. He is not complaining of fever, chills, or other systemic symptoms. He states his leg feels a lot better on the doxycycline I gave him empirically. He also apparently gets injections at his primary doctor's officeo Rocephin for cellulitis prophylaxis. I didn't ask him about his compression pump compliance today I think that's probably  marginal. Arrives in the clinic with all of his dressings primary and secondary macerated full of fluid and he has bilateral edema 11/10/17; the patient's right leg looks some better although there is still a cluster of wounds on the right lateral. The left leg is inflamed with almost circumferential skin loss medially to laterally although we are still maintaining anteriorly. He does not have overt cellulitis there is a lot of drainage. He is not using compression pumps. We have been using silver alginate to the wound areas, there are not a lot of options here 11/17/17; the patient's right leg continues to be stable although there is still open wounds, better than last week. The inflammation in the left leg is better. Still loss of surface layer epithelium especially posteriorly. There is no overt cellulitis in the amount of edema and his left leg is really quite good, tells me he is using his compression pumps once a day. 11/24/17; patient's right leg has a small superficial wound laterally this continues to improve. The inflammation in the left leg is still improving however we have continuous surface layer epithelial loss posteriorly. There is no overt cellulitis in the amount of edema in both legs is really quite good. He states he is using his compression pumps on the left leg once a day for 5 out of 7 days 12/01/17; very small superficial areas on the right lateral leg continue to improve. Edema control in both legs is better today. He has continued loss of surface epithelialization and left posterior calf although I think this is better. We have been using silver alginate with large number of absorptive secondary dressings 4 layer on the left Unna boot on the right at his request. He tells me he is using his compression pumps once a day 12/08/17; he has no open area on the right leg is edema control is good here. On the left leg however he has marked erythema and tenderness breakdown of skin. He has  what appears to be a wrap injury just distal to the popliteal fossa. 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 There was swelling erythema and breakdown in the right leg. He has much worse skin breakdown in the left leg as well multiple open areas medially and posteriorly as well as laterally. He tells me he has been using his compression pumps but tells me he feels that the drainage out of his leg is worse when he uses a compression pumps. T be fair to him he is been saying this o for a  while however I don't know that I have really been listening to this. I wonder if the compression pumps are working properly 12/22/17;. Once again he arrives with severe erythema, weeping edema from the left greater than right leg. Noncompliance with compression pumps. New this visit he is complaining of pain on the lateral aspect of the right leg and the medial aspect of his right thigh. He apparently saw his cardiologist Dr. [name] who was ordered an echocardiogram area and I think this is a step in the right direction 12/25/17; started his doxycycline Monday night. There is still intense erythema of the right leg especially in the anterior thigh although there is less tenderness. The erythema around the wound on the right lateral calf also is less tender. He still complaining of pain in the left heel. His wounds are about the same right lateral left medial left lateral. Superficial but certainly not close to closure. He denies being systemically unwell no fever chills no abdominal pain no diarrhea 12/29/17; back in follow-up of his extensive right calf and right thigh cellulitis. I added amoxicillin to cover possible doxycycline resistant  strep. This seems to of done the trick he is in much less pain there is much less erythema and swelling. He has his echocardiogram at 11:00 this morning. X-ray of the left heel was also negative. 01/05/18; the patient arrived with his edema under much better control. Now that he is retired he is able to use his compression pumps daily and sometimes twice a day per the patient. He has a wound on the right leg the lateral wound looks better. Area on the left leg also looks a lot better. He has no evidence of cellulitis in his bilateral thighs I had a quick peak at his echocardiogram. He is in normal ejection fraction and normal left ventricular function. He has moderate pulmonary hypertension moderately reduced right ventricular function. One would have to wonder about chronic sleep apnea although he says he doesn't snore. He'll review the echocardiogram with his cardiologist. 01/12/18; the patient arrives with the edema in both legs under exemplary control. He is using his compression pumps daily and sometimes twice daily. His wound on the right lateral leg is just about closed. He still has some weeping areas on the posterior left calf and lateral left calf although everything is just about closed here as well. I have spoken with Aldean Baker who is the patient's nurse practitioner and infectious disease. She was concerned that the patient had not understood that the parenteral penicillin injections he was receiving for cellulitis prophylaxis was actually benefiting him. I don't think the patient actually saw that I would tend to agree we were certainly dealing with less infections although he had a serious one last month. 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 He is healed. He'll be placed in juxtalite compression wraps and increase his lymphedema pumps to twice daily. We will follow up again next week to ensure there are no issues with the new  regiment. 01/20/18-he is here for evaluation of bilateral lower extremity weeping edema. Yesterday he was placed in compression wrap to the right lower extremity and compression stocking to left lower shrubbery. He states he uses lymphedema pumps last night and again this morning and noted a blister to the left lower extremity. On exam he was noted to have drainage to the right lower extremity. He will be placed in Unna boots bilaterally and follow-up next week 01/26/18; patient was  actually discharged a week ago to his own juxta light stockings only to return the next day with bilateral lower extremity weeping edema.he was placed in bilateral Unna boots. He arrives today with pain in the back of his left leg. There is no open area on the right leg however there is a linear/wrap injury on the left leg and weeping edema on the left leg posteriorly. I spoke with infectious disease about 10 days ago. They were disappointed that the patient elected to discontinue prophylactic intramuscular penicillin shots as they felt it was particularly beneficial in reducing the frequency of his cellulitis. I discussed this with the patient today. He does not share this view. He'll definitely need antibiotics today. Finally he is traveling to [place] and trauma leaving this Saturday and returning a week later and he does not travel with his pumps. He is going by car 01/30/18; patient was seen 4 days ago and brought back in today for review of cellulitis in the left leg posteriorly. I put him on amoxicillin this really hasn't helped as much as I might like. He is also worried because he is traveling to [facility] trauma by car. Finally we will be rewrapping him. There is no open area on the right leg over his left leg has multiple weeping areas as usual 02/09/18; The same wrap on for 10 days. He did not pick up the last doxycycline I prescribed for him. He apparently took 4 days worth he already had. There is nothing open on  his right leg and the edema control is really quite good. He's had damage in the left leg medially and laterally especially probably related to the prolonged use of Unna boots 02/12/18; the patient arrived in clinic today for a nurse visit/wrap change. He complained of a lot of pain in the left posterior calf. He is taking doxycycline that I previously prescribed for him. Unfortunately even though he used his stockings and apparently used to compression pumps twice a day he has weeping edema coming out of the lateral part of his right leg. This is coming from the lower anterior lateral skin area. 02/16/18; the patient has finished his doxycycline and will finish the amoxicillin 2 days. The area of cellulitis in the left calf posteriorly has resolved. He is no longer having any pain. He tells me he is using his compression pumps at least once a day sometimes twice. 02/23/18; the patient finished his doxycycline and Amoxil last week. On Friday he noticed a small erythematous circle about the size of a quarter on the left lower leg just above his ankle. This rapidly expanded and he now has erythema on the lateral and posterior part of the thigh. This is bright red. Also has an area on the dorsal foot just above his toes and a tender area just below the left popliteal fossa. He came off his prophylactic penicillin injections at his own insistence one or 2 months ago. This is obviously deteriorated since then 03/02/18; patient is on doxycycline and Amoxil. Culture I did last week of the weeping area on the back of his left calf grew group B strep. I have therefore renewed the amoxicillin 500 3 times a day for a further week. He has not been systemically unwell. Still complaining of an area of discomfort right under his left popliteal fossa. There is no open wound on the right leg. He tells me that he is using his pumps twice a day on most days 03/09/18; patient arrives in  clinic today completing his amoxicillin  today. The cellulitis on his left leg is better. Furthermore he tells me that he had intramuscular penicillin shots that his primary care office today. However he also states that the wrap on his right leg fell down shortly after leaving clinic last week. He developed a large blister that was present when he came in for a nurse visit later in the week and then he developed intense discomfort around this area.He tells me he is using his compression pumps 03/16/18; the patient has completed his doxycycline. The infectious part of this/cellulitis in the left heel area left popliteal area is a lot better. He has 2 open areas on the right calf. Still areas on the left calf but this is a lot better as well. 03/24/18; the patient arrives complaining of pain in the left popliteal area again. He thinks some of this is wrap injury. He has no open area on the right leg and really no open area on the left calf either except for the popliteal area. He claims to be compliant with the compression pumps 03/31/18; I gave him doxycycline last week because of cellulitis in the left popliteal area. This is a lot better although the surface epithelium is denuded off and response to this. He arrives today with uncontrolled edema in the right calf area as well as a fingernail injury in the right lateral calf. There is only a few open areas on the left 04/06/18; I gave him amoxicillin doxycycline over the last 2 weeks that the amoxicillin should be completing currently. He is not complaining of any pain or systemic symptoms. The only open areas see has is on the right lateral lower leg paradoxically I cannot see anything on the left lower leg. He tells me he is using his compression pumps twice a day on most days. Silver alginate to the wounds that are open under 4 layer compression 04/13/18; he completed antibiotics and has no new complaints. Using his compression pumps. Silver alginate that anything that's opened 04/20/18; he is  using his compression pumps religiously. Silver alginate 4 layer compression anything that's opened. He comes in today with no open wounds on the left leg but 3 on the right including a new one posteriorly. He has 2 on the right lateral and one on the right posterior. He likes Unna boots on the right leg for reasons that aren't really clear we had the usual 4 layer compression on the left. It may be necessary to move to the 4 layer compression on the right however for now I left them in the Unna boots 04/27/18; he is using his compression pumps at least once a day. He has still the wounds on the right lateral calf. The area right posteriorly has closed. He does not have an open wound on the left under 4 layer compression however on the dorsal left foot just proximal to the toes and the left third toe 2 small open areas were identified 05/11/18; he has not uses compression pumps. The areas on the right lateral calf have coalesced into one large wound necrotic surface. On the left side he has one small wound anteriorly however the edema is now weeping out of a large part of his left leg. He says he wasn't using his pumps because of the weeping fluid. I explained to him that this is the time he needs to pump more 05/18/18; patient states he is using his compression pumps twice a day. The area on  the right lateral large wound albeit superficial. On the left side he has innumerable number of small new wounds on the left calf particularly laterally but several anteriorly and medially. All these appear to have healthy granulated base these look like the remnants of blisters however they occurred under compression. The patient arrives in clinic today with his legs somewhat better. There is certainly less edema, less multiple open areas on the left calf and the right anterior leg looks somewhat better as well superficial and a little smaller. However he relates pain and erythema over the last 3-4 days in the thigh  and I looked at this today. He has not been systemically unwell no fever no chills no change in blood sugar values 05/25/18; comes in today in a better state. The severe cellulitis on his left leg seems better with the Keflex. Not as tender. He has not been systemically unwell Hard to find an open wound on the left lower leg using his compression pumps twice a day The confluent wounds on his right lateral calf somewhat better looking. These will ultimately need debridement I didn't do this today. 06/01/18; the severe cellulitis on the left anterior thigh has resolved and he is completed his Keflex. There is no open wound on the left leg however there is a superficial excoriation at the base of the third toe dorsally. Skin on the bottom of his left foot is macerated looking. The left the wounds on the lateral right leg actually looks some better although he did require debridement of the top half of this wound area with an open curet 06/09/18 on evaluation today patient appears to be doing poorly in regard to his right lower extremity in particular this appears to likely be infected he has very thick purulent discharge along with a bright green tent to the discharge. This makes me concerned about the possibility of pseudomonas. He's also having increased discomfort at this point on evaluation. Fortunately there does not appear to be any evidence of infection spreading to the other location at this time. 06/16/18 on evaluation today patient appears to actually be doing fairly well. His ulcer has actually diminished in size quite significantly at this point which is good news. Nonetheless he still does have some evidence of infection he did see infectious disease this morning before coming here for his appointment. I did review the results of their evaluation and their note today. They did actually have him discontinue the Cipro and initiate treatment with linezolid at this time. He is doing this for the  next seven days and they recommended a follow-up in four months with them. He is the keep a log of the need for intermittent antibiotic therapy between now and when he falls back up with infectious disease. This will help them gaze what exactly they need to do to try and help them out. 06/23/18; the patient arrives today with no open wounds on the left leg and left third toe healed. He is been using his compression pumps twice a day. On the right lateral leg he still has a sizable wound but this is a lot better than last time I saw this. 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 Has been using silver alginate under 4 layer compression 06/30/18; the only open wound he has is on the right lateral leg and this looks healthy. No debridement is required. We have  been using silver alginate. He does not have an open wound on the left leg. There is apparently some drainage from the dorsal proximal third toe on the left although I see no open wound here. 07/03/18 on evaluation today patient was actually here just for a nurse visit rapid change. However when he was here on Wednesday for his rat change due to having been healed on the left and then developing blisters we initiated the wrap again knowing that he would be back today for [place] to reevaluate and see were at. Unfortunately he has developed some cellulitis into the proximal portion of his right lower extremity even into the region of his thigh. 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 He was placed on one as what at that point. Nonetheless he is done with that and has been tolerating it well otherwise. Doxycycline which in the past really did not seem to be effective for him. Nonetheless I think the best option may be for [place] to definitely reinitiate the antibiotics for a longer period of time. 07/07/18; since I  last saw this patient a week ago he has had a difficult time. At that point he did not have an open wound on his left leg. We transitioned him into juxta light stockings. He was apparently in the clinic the next day with blisters on the left lateral and left medial lower calf. He also had weeping edema fluid. He was put back into a compression wrap. He was also in the clinic on Friday with intense erythema in his right thigh. Per the patient he was started on Bactrim however that didn't work at all in terms of relieving his pain and swelling. He has taken 3 doxycycline that he had left over from last time and that seems to of helped. He has blistering on the right thigh as well. 07/14/18; the erythema on his right thigh has gotten better with doxycycline that he is finishing. 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 Presumably this cellulitis in the thigh was not related to that although I think this is a bit concerning going forward. He still has an area on the right lateral calf the blister on the right medial calf just below the knee that was discussed above. On the left 2 small open areas left medial and left lateral. Edema control is adequate. He is using his compression pumps twice a day 07/20/18; continued improvement in the condition of both legs especially the edema in his bilateral thighs. He tells me he is been losing weight through a combination of diet and exercise. He is using his compression pumps twice a day. So overall she made to the remaining wounds 07/27/2018; continued improvement in condition of both legs. His edema is well controlled. The area on the right lateral leg is just about closed he had one blisters show up on the medial left upper calf. We have him in 4 layer compression. He is going on a 10-day trip to [place], [place] and [place]. He will be driving. He wants to wear Unna boots because of  the lessening amount of constriction. He will not use compression pumps while he is away 08/05/18 on evaluation today patient actually appears to be doing decently well all things considered in regard to his bilateral lower extremities. The worst ulcer is actually only posterior aspect of  his left lower extremity with a four layer compression wrap cut into his leg a couple weeks back. He did have a trip and actually had [employer] for the trip that he is worn since he was last here. Nonetheless he feels like the [employer] actually do better for him his swelling is up a little bit but he also with his trip was not taking his Lasix on a regular set schedule like he was supposed to be. He states that obviously the reason being that he cannot drive and keep going without having to urinate too frequently which makes it difficult. He did not have his pumps with him while he was away either which I think also maybe playing a role here too. 08/13/2018; the patient only has a small open wound on the right lateral calf which is a big improvement in the last month or 2. He also has the area posteriorly just below the posterior fossa on the left which I think was a wrap injury from several weeks ago. He has no current evidence of cellulitis. He tells me he is back into his compression pumps twice a day. He also tells me that while he was at the laundromat somebody stole a section of his extremitease stockings 08/20/2018; back in the clinic with a much improved state. He only has small areas on the right lateral mid calf which is just about healed. This was is more substantial area for quite a prolonged period of time. He has a small open area on the left anterior tibia. The area on the posterior calf just below the popliteal fossa is closed today. He is using his compression pumps twice a day 08/28/2018; patient has no open wound on the right leg. He has a smattering of open areas on the calf with some  weeping lymphedema. More problematically than that it looks as though his wraps of slipped down in his usual he has very angry upper area of edema just below the right medial knee and on the right lateral calf. He has no open area on his feet. The patient is traveling to [facility] next week. I will send him in an antibiotic. We will continue to wrap the right leg. We ordered extremitease stockings for him last week and I plan to transition the right leg to a stocking when he gets home which will be in 10 days time. As usual he is very reluctant to take his pumps with him when he travels 09/07/2018; patient returns from [facility]. He shows me a picture of his left leg in the mid part of his trip last week with intense fire engine erythema. The picture look bad enough I would have considered sending him to the hospital. Instead he went to the wound care center in [facility]. They did not prescribe him antibiotics but he did take some doxycycline he had leftover from a previous visit. I had given him trimethoprim sulfamethoxazole before he left this did not work according to the patient. This is resulted in some improvement fortunately. He comes back with a large wound on the left posterior calf. Smaller area on the left anterior tibia. Denuded blisters on the dorsal left foot over his toes. Does not have much in the way of wounds on the right leg although he does have a very tender area on the right posterior area just below the popliteal fossa also suggestive of infection. He promises me he is back on his  pumps twice a day 09/15/2018; the intense cellulitis in his left lower calf is a lot better. The wound area on the posterior left calf is also so better. However he has reasonably extensive wounds on the dorsal aspect of his second and third toes and the proximal foot just at the base of the toes. There is nothing open on the right leg 09/22/2018; the patient has excellent edema  control in his legs bilaterally. He is using his external compression pumps twice a day. He has no open area on the right leg and only the areas in the left foot dorsally second and third toe area on the left side. He does not have any signs of active cellulitis. 10/06/2018; the patient has good edema control bilaterally. He has no open wound on the right leg. There is a blister in the posterior aspect of his left calf that we had to deal with today. He is using his compression pumps twice a day. There is no signs of active cellulitis. We have been using silver alginate to the wound areas. He still has vulnerable areas on the base of his left first second toes dorsally He has a his extremities stockings and we are going to transition him today into the stocking on the right leg. He is cautioned that he will need to continue to use the compression pumps twice a day. If he notices uncontrolled edema in the right leg he may need to go to 3 times a day. 10/13/2018; the patient came in for a nurse check on Friday he has a large flaccid blister on the right medial calf just below the knee. We unroofed this. He has this and a new area underneath the posterior mid calf which was undoubtedly a blister as well. He also has several small areas on the right which is the area we put his extremities stocking on. 10/19/2018; the patient went to see infectious disease this morning I am not sure if that was a routine follow-up in any case the doxycycline I had given him was discontinued and started on linezolid. He has not started this. It is easy to look at his left calf and the inflammation and think this is cellulitis however he is very tender in the tissue just below the popliteal fossa and I have no doubt that there is infection going on here. He states the problem he is having is that with the compression pumps the edema goes down and then starts walking the wrap falls down. We will see if we can adhere this. He  has 1 or 2 minuscule open areas on the right still areas that are weeping on the posterior left calf, the base of his left second and third toes 10/26/18; back today in clinic with quite of skin breakdown in his left anterior leg. This may have been infection the area below the popliteal fossa seems a lot better however tremendous epithelial loss on the left anterior mid tibia area over quite inexpensive tissue. He has 2 blisters on the right side but no other open wound here. 10/29/2018; came in urgently to see [place] today and we worked him in for review. He states that the 4 layer compression on the right leg caused pain he had to cut it down to roughly his mid calf this caused swelling above the wrap and he has blisters and skin breakdown today. As a result of the pain he has not been using his pumps. Both legs are a lot more  edematous and there is a lot of weeping fluid. 11/02/18; arrives in clinic with continued difficulties in the right leg> left. Leg is swollen and painful. multiple skin blisters and new open areas especially laterally. He has not been using his pumps on the right leg. 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 He is not systemically unwell. 11/09/2018; the patient claims he is being compliant with his pumps. He is finished the doxycycline I gave him last week. Culture I did of the wound on the right lateral leg showed a few very resistant methicillin staph aureus. This was resistant to doxycycline. Nevertheless he states the pain in the leg is a lot better which makes me wonder if the cultured organism was not really what was causing the problem nevertheless this is a very dangerous organism to be culturing out of any wound. His right leg is still a lot larger than the left. He is using an Radio broadcast assistant on this area, he blames a 4-layer compression for causing the original skin breakdown which I doubt is true however I cannot talk him out  of it. We have been using silver alginate to all of these areas which were initially blisters 11/16/2018; patient is being compliant with his external compression pumps at twice a day. Miraculously he arrives in clinic today with absolutely no open wounds. He has better edema control on the left where he has been using 4 layer compression versus wound of wounds on the right and I pointed this out to him. There is no inflammation in the skin in his lower legs which is also somewhat unusual for him. There is no open wounds on the dorsal left foot. He has extremitease stockings at home and I have asked him to bring these in next week. 11/25/18 patient's lower extremity on examination today on the left appears for the most part to be wound free. He does have an open wound on the lateral aspect of the right lower extremity but this is minimal compared to what I've seen in past. He does request that we go ahead and wrap the left leg as well even though there's nothing open just so hopefully it will not reopen in short order. 1/28; patient has superficial open wounds on the right lateral calf left anterior calf and left posterior calf. His edema control is adequate. 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 We have been using silver alginate as the primary dressing. He claims compliance with his compression pumps 2/4; patient has superficial open wounds on numerous areas of his left calf and again one on the left dorsal foot. The areas on the right lateral calf have healed. The cellulitis that I gave him doxycycline for last week is also resolved this was mostly on the left anterior calf just below the tibial tuberosity. His edema looks fairly well-controlled. He tells me he went to see his primary doctor today and had blood work ordered 2/11; once again he has several open areas on the left calf left tibial area. Most of  these are small and appear to have healthy granulation. He does not have anything open on the right. The edema and control in his thighs is pretty good which is usually a good indication he has been using his pumps as requested. 2/18; he continues to have several small areas on the left calf and left tibial  area. Most of these are small healthy granulation. We put him in his stocking on the right leg last week and he arrives with a superficial open area over the right upper tibia and a fairly large area on the right lateral tibia in similar condition. His edema control actually does not look too bad, he claims to be using his compression pumps twice a day 2/25. Continued small areas on the left calf and left tibial area. New areas especially on the right are identified just below the tibial tuberosity and on the right upper tibia itself. There are also areas of weeping edema fluid even without an obvious wound. He does not have a considerable degree of lymphedema but clearly there is more edema here than his skin can handle. He states he is using the pumps twice a day. We have an Unna boot on the right and 4 layer compression on the left. 3/3; he continues to have an area on the right lateral calf and right posterior calf just below the popliteal fossa. There is a fair amount of tenderness around the wound on the popliteal fossa but I did not see any evidence of cellulitis, could just be that the wrap came down and rubbed in this area. He does not have an open area on the left leg however there is an area on the left dorsal foot at the base of the third toe We have been using silver alginate to all wound areas 3/10; he did not have an open area on his left leg last time he was here a week ago. T oday he arrives with a horizontal wound just below the tibial tuberosity and an area on the left lateral calf. He has intense erythema and tenderness in this area. The area is on the right lateral calf and  right posterior calf better than last week. We have been using silver alginate as usual 3/18 - Patient returns with 3 small open areas on left calf, and 1 small open area on right calf, the skin looks ok with no significant erythema, he continues the UNA boot on right and 4 layer compression on left. The right lateral calf wound is closed , the right posterior is small area. we will continue silver alginate to the areas. 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 He is been tolerating the dressing changes without complication. Fortunately she has made excellent improvement in regard to the overall status of his wounds. Unfortunately every time we cease wrapping him he ends up reopening in causing more significant issues at that point. Again I'm unsure of the best direction to take although I think the lymphedema clinic may be appropriate for him. 02/03/19 on evaluation today patient appears to be doing well in regard to the wounds that we saw him for last week unfortunately he has a new area on the proximal portion of his right medial/posterior lower extremity where the wrap somewhat slowed down and caused swelling and a blister to rub and open. Unfortunately this is the only opening that he has on either leg at this point. 02/17/19 on evaluation today patient's bilateral lower extremities appear to be doing well. He still completely healed in regard to the left lower extremity. In regard to the right lower extremity the area where the wrap and slid down  and caused the blister still seems to be slightly open although this is dramatically better than during the last evaluation two weeks ago. I'm very pleased with the way this stands overall. 03/03/19 on evaluation today patient appears to be doing well in regard  to his right lower extremity in general although he did have a new blister open this does not appear to be showing any evidence of active infection at this time. Fortunately there's No fevers, chills, nausea, or vomiting noted at this time. Overall I feel like he is making good progress it does feel like that the right leg will we perform the [employer] seems to do with a bit better than three layer wrap on the left which slid down on him. We may switch to doing bilateral in the book wraps. 5/4; I have not seen Mr. Goodenow in quite some time. According to our case manager he did not have an open wound on his left leg last week. He had 1 remaining wound on the right posterior medial calf. He arrives today with multiple openings on the left leg probably were blisters and/or wrap injuries from Unna boots. I do not think the Unna boot's will provide adequate compression on the left. I am also not clear about the frequency he is using the compression pumps. 03/17/19 on evaluation today patient appears to be doing excellent in regard to his lower extremities compared to last week's evaluation apparently. He had gotten significantly worse last week which is unfortunate. The [employer] wrap on the left did not seem to do very well for him at all and in fact it didn't control his swelling significantly enough he had an additional outbreak. Subsequently we go back to the four layer compression wrap on the left. This is good news. At least in that he is doing better and the wound seem to be killing him. He still has not heard anything from the lymphedema clinic. 03/24/19 on evaluation today patient actually appears to be doing much better in regard to his bilateral lower Trinity as compared to last week when I saw him. Fortunately there's no signs of active infection at this time. He has been tolerating the dressing changes without complication. Overall I'm extremely pleased with the progress and appearance in  general. 04/07/19 on evaluation today patient appears to be doing well in regard to his bilateral lower extremities. His swelling is significantly down from where it was previous. With that being said he does have a couple blisters still open at this point but fortunately nothing that seems to be too severe and again the majority of the larger openings has healed at this time. 04/14/19 on evaluation today patient actually appears to be doing quite well in regard to his bilateral lower extremities in fact I'm not even sure there's anything significantly open at this time at any site. Nonetheless he did have some trouble with these wraps where they are somewhat irritating him secondary to the fact that he has noted that the graph wasn't too close down to the end of this foot in a little bit short as well up to his knee. Otherwise things seem to be doing quite well. 04/21/19 upon evaluation today patient's wound bed actually showed evidence of being completely healed in regard to both lower extremities which is excellent news. There does not appear to be any signs of active infection which is also good news. I'm very pleased in this regard. No fevers, chills, nausea, or vomiting noted  at this time. 04/28/19 on evaluation today patient appears to be doing a little bit worse in regard to both lower extremities on the left mainly due to the fact that when he went infection disease the wrap was not wrapped quite high enough he developed a blister above this. On the right he is a small open area of nothing too significant but again this is continuing to give him some trouble he has been were in the Velcro compression that he has at home. 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 He's been tolerating the dressing changes without complication. Fortunately there's no signs of active infection at this time. No fevers, chills, nausea, or vomiting noted at  this time. We have been trying to get an appointment with her lymphedema clinic in [facility] but unfortunately nobody can get them on phone with not been able to even fax information over the patient likewise is not been able to get in touch with them. Overall I'm not sure exactly what's going on here with to reach out again today. 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 Still having a lot of drainage unfortunately. He tells me especially in the left but even on the right. There's no signs of active infection which is good news we've been using so ratcheted up to this point. 05/19/19 on evaluation today patient actually appears to be doing quite well with regard to his left lower extremity which is great news. Fortunately in regard to the right lower extremity has an issues with his wrap and he subsequently did remove this from what I'm understanding. Nonetheless long story short is what he had rewrapped once he removed it subsequently had maggots underneath this wrap whenever he came in for evaluation today. With that being said they were obviously completely cleaned away by the nursing staff. The visit today which is excellent news. However he does appear to potentially have some infection around the right ankle region where the maggots were located as well. He will likely require anabiotic therapy today. 05/26/19 on evaluation today patient actually appears to be doing much better in regard to his bilateral lower extremities. I feel like the infection is under much better control. With that being said there were maggots noted when the wrap was removed yet again today. Again this could have potentially been left over from previous although at this time there does not appear to be any signs of significant drainage there was obviously on the wrap some drainage as well this contracted gnats or  otherwise. Either way I do not see anything that appears to be doing worse in my pinion and in fact I think his drainage has slowed down quite significantly likely mainly due to the fact to his infection being under better control. 06/02/2019 on evaluation today patient actually appears to be doing well with regard to his bilateral lower extremities there is no signs of active infection at this time which is great news. With that being said he does have several open areas more so on the right than the left but nonetheless these are all significantly better than previously noted. 06/09/2019 on evaluation today patient actually appears to be doing well. His wrap stayed up and he did not cause any problems he had more drainage on the right compared to the left but overall I do not see  any major issues at this time which is great news. 06/16/2019 on evaluation today patient appears to be doing excellent with regard to his lower extremities the only area that is open is a new blister that can have opened as of today on the medial ankle on the left. Other than this he really seems to be doing great I see no major issues at this point. 06/23/2019 on evaluation today patient appears to be doing quite well with regard to his bilateral lower extremities. In fact he actually appears to be almost completely healed there is a small area of weeping noted of the right lower extremity just above the ankle. Nonetheless fortunately there is no signs of active infection at this time which is good news. No fevers, chills, nausea, vomiting, or diarrhea. 8/24; the patient arrived for a nurse visit today but complained of very significant pain in the left leg and therefore I was asked to look at this. Noted that he did not have an open area on the left leg last week nevertheless this was wrapped. The patient states that he is not been able to put his compression pumps on the left leg because of the discomfort. He has not been  systemically unwell 06/30/2019 on evaluation today patient unfortunately despite being excellent last week is doing much worse with regard to his left lower extremity today. In fact he had to come in for a nurse on Monday where his left leg had to be rewrapped due to excessive weeping Dr. Leanord Hawking placed him on doxycycline at that point. Fortunately there is no signs of active infection Systemically at this time which is good news. 07/07/2019 in regard to the patient's wounds today he actually seems to be doing well with his right lower extremity there really is nothing open or draining at this point this is great news. Unfortunately the left lower extremity is given him additional trouble at this time. There does not appear to be any signs of active infection nonetheless he does have a lot of edema and swelling noted at this point as well as blistering all of which has led to a much more poor appearing leg at this time compared to where it was 2 weeks ago when it was almost completely healed. Obviously this is a little discouraging for the patient. He is try to contact the lymphedema clinic in [place] he has not been able to get through to them. 07/14/2019 on evaluation today patient actually appears to be doing slightly better with regard to his left lower extremity ulcers. Overall I do feel like at least at the top of the wrap that we have been placing this area has healed quite nicely and looks much better. The remainder of the leg is showing signs of improvement. Unfortunately in the thigh area he still has an open region on the left and again on the right he has been utilizing just a Band-Aid on an area that also opened on the thigh. Again this is an area that were not able to wrap although we did do an Ace wrap to provide some compression that something that obviously is a little less effective than the compression wraps we have been using on the lower portion of the leg. He does have an  appointment with the lymphedema clinic in [facility] on Friday. 07/21/2019 on evaluation today patient appears to be doing better with regard to his lower extremity ulcers. He has been tolerating the dressing changes without complication. Fortunately there is  no signs of active infection at this time. No fevers, chills, nausea, vomiting, or diarrhea. I did receive the paperwork from the physical therapist at the lymphedema clinic in [place]. Subsequently I signed off on that this morning and sent that back to him for further progression with the treatment plan. 07/28/2019 on evaluation today patient appears to be doing very well with regard to his right lower extremity where I do not see any open wounds at this point. Fortunately he is feeling great as far as that is concerned as well. In regard to the left lower extremity he has been having issues with still several areas of weeping and edema although the upper leg is doing better his lower leg still I think is going require the compression wrap at this time. No fevers, chills, nausea, vomiting, or diarrhea. 08/04/2019 on evaluation today patient unfortunately is having new wounds on the right lower extremity. Again we have been using Unna boot wrap on that side. We switched him to using his juxta lite wrap at home. With that being said he tells me he has been using it although his legs extremely swollen and to be honest really does not appear that he has been. I cannot know that for sure however. Nonetheless he has multiple new wounds on the right lower extremity at this time. Obviously we will have to see about getting this rewrapped for him today. 08/11/2019 on evaluation today patient appears to be doing fairly well with regard to his wounds. He has been tolerating the dressing changes including the compression wraps without complication. He still has a lot of edema in his upper thigh regions bilaterally he is supposed to be seeing the  lymphedema clinic on the 15th of this month once his wraps arrive for the upper part of his legs. 08/18/2019 on evaluation today patient appears to be doing well with regard to his bilateral lower extremities at this point. He has been tolerating the dressing changes without complication. Fortunately there is no signs of active infection which is also good news. He does have a couple weeping areas on the first and second toe of the right foot he also has just a small area on the left foot upper leg and a small area on the left lower leg but overall he is doing quite well in my opinion. He is supposed to be getting his wraps shortly in fact tomorrow and then subsequently is seeing the lymphedema clinic next Wednesday on the 21st. Of note he is also leaving on the 25th to go on vacation for a week to the beach. For that reason and since there is some uncertainty about what there can be doing at lymphedema clinic next Wednesday I am get a make an appointment for next Friday here for [place] to see what we need to do for him prior to him leaving for vacation. 10/23; patient arrives in considerable pain predominantly in the upper posterior calf just distal to the popliteal fossa also in the wound anteriorly above the major wound. This is probably cellulitis and he has had this recurrently in the past. He has no open wound on the right side and he has had an Radio broadcast assistant in that area. Finally I note that he has an area on the left posterior calf which by enlarge is mostly epithelialized. This protrudes beyond the borders of the surrounding skin in the setting of dry scaly skin and lymphedema. The patient is leaving for [place] on Sunday. Per  his longstanding pattern, he will not take his compression pumps with him predominantly out of fear that they will be stolen. He therefore asked that we put a Unna boot back on the right leg. He will also contact the wound care center in [facility] to see if they can  change his dressing in the mid week. 11/3; patient returned from his vacation to [facility]. He was seen on 1 occasion at their wound care center. They did a 2 layer compression system as they did not have our 4-layer wrap. I am not completely certain what they put on the wounds. They did not change the Unna boot on the right. The patient is also seeing a lymphedema specialist physical therapist in [place]. It appears that he has some compression sleeve for his thighs which indeed look quite a bit better than I am used to seeing. He pumps over these with his external compression pumps. 11/10; the patient has a new wound on the right medial thigh otherwise there is no open areas on the right. He has an area on the left leg posteriorly anteriorly and medially and an area over the left second toe. We have been using silver alginate. He thinks the injury on his thigh is secondary to friction from the compression sleeve he has. 11/17; the patient has a new wound on the right medial thigh last week. He thinks this is because he did not have a underlying stocking for his thigh juxta lite apparatus. He now has this. The area is fairly large and somewhat angry but I do not think he has underlying cellulitis. He has a intact blister on the right anterior tibial area. Small wound on the right great toe dorsally Small area on the medial left calf. 11/30; the patient does not have any open areas on his right leg and we did not take his juxta lite stocking off. However he states that on Friday his compression wrap fell down lodging around his upper mid calf area. As usual this creates a lot of problems for him. He called urgently today to be seen for a nurse visit however the nurse visit turned into a provider visit because of extreme erythema and pain in the left anterior tibia extending laterally and posteriorly. The area that is problematic is extensive 10/06/2019 upon evaluation today patient actually  appears to be doing poorly in regard to his left lower extremity. He Dr. Leanord Hawking did place him on doxycycline this past Monday apparently due to the fact that he was doing much worse in regard to this left leg. Fortunately the doxycycline does seem to be helping. Unfortunately we are still having a very difficult time getting his edema under any type of control in order to anticipate discharge at some point. The only way were really able to control his lymphedema really is with compression wraps and that has only even seemingly temporary. He has been seeing a lymphedema clinic they are trying to help in this regard but still this has been somewhat frustrating in general for the patient. 10/13/19 on evaluation today patient appears to be doing excellent with regard to his right lower extremity as far as the wounds are concerned. His swelling is still quite extensive unfortunately. He is still having a lot of drainage from the thigh areas bilaterally which is unfortunate. He's been going to lymphedema clinic but again he still really does not have this edema under control as far as his lower extremities are concern. With regard  to his left lower extremity this seems to be improving and I do believe the doxycycline has been of benefit for him. He is about to complete the doxycycline. 10/20/2019 on evaluation today patient appears to be doing poorly in regard to his bilateral lower extremities. More in the right thigh he has a lot of irritation at this site unfortunately. In regard to the left lower extremity the wrap was not quite as high it appears and does seem to have caused him some trouble as well. Fortunately there is no evidence of systemic infection though he does have some blue-green drainage which has me concerned for the possibility of Pseudomonas. He tells me he is previously taking Cipro without complications and he really does not care for Levaquin however due to some of the side effects he  has. He is not allergic to any medications specifically antibiotics that were aware of. 10/27/2019 on evaluation today patient actually does appear to be for the most part doing better when compared to last week's evaluation. With that being said he still has multiple open wounds over the bilateral lower extremities. He actually forgot to start taking the Cipro and states that he still has the whole bottle. He does have several new blisters on left lower extremity today I think I would recommend he go ahead and take the Cipro based on what I am seeing at this point. 12/30-Patient comes at 1 week visit, 4 layer compression wraps on the left and Unna boot on the right, primary dressing Xtrasorb and silver alginate. Patient is taking his Cipro and has a few more days left probably 5-6, and the legs are doing better. He states he is using his compressions devices which I believe he has 11/10/2019 on evaluation today patient actually appears to be much better than last time I saw him 2 weeks ago. His wounds are significantly improved and overall I am very pleased in this regard. Fortunately there is no signs of active infection at this time. He is just a couple of days away from completing Cipro. Overall his edema is much better he has been using his lymphedema pumps which I think is also helping at this point. 11/17/2019 on evaluation today patient appears to be doing excellent in regard to his wounds in general. His legs are swollen but not nearly as much as they have been in the past. Fortunately he is tolerating the compression wraps without complication. No fevers, chills, nausea, vomiting, or diarrhea. He does have some erythema however in the distal portion of his right lower extremity specifically around the forefoot and toes there is a little bit of warmth here as well. 11/24/2019 on evaluation today patient appears to be doing well with regard to his right lower extremity I really do not see any open  wounds at this point. His left lower extremity does have several open areas and his right medial thigh also is open. Other than this however overall the patient seems to be making good progress and I am very pleased at this point. 12/01/2019 on evaluation today patient appears to be doing poorly at this point in regard to his left lower extremity has several new blisters despite the fact that we have him in compression wraps. In fact he had a 4-layer compression wrap, his upper thigh wrapped from lymphedema clinic, and a juxta light over top of the 4 layer compression wrap the lymphedema clinic applied and despite all this he still develop blisters underneath. Obviously this does  have me concerned about the fact that unfortunately despite what we are doing to try to get wounds healed he continues to have new areas arise I do not think he is ever good to be at the point where he can realistically just use wraps at home to keep things under control. Typically when we heal him it takes about 1-2 days before he is back in the clinic with severe breakdown and blistering of his lower extremities bilaterally. This is happened numerous times in the past. Unfortunately I think that we may need some help as far as overall fluid overload to kind of limit what we are seeing and get things under better control. 12/08/2019 on evaluation today patient presents for follow-up concerning his ongoing bilateral lower extremity edema. Unfortunately he is still having quite a bit of swelling the compression wraps are controlling this to some degree but he did see Dr. Debara Pickett his cardiologist I do have that available for review today as far as the appointment was concerned that was on 12/06/2019. Obviously that she has been 2 days ago. The patient states that he is only been taking the Lasix 80 mg 1 time a day he had told me previously he was taking this twice a day. Nonetheless Dr. Debara Pickett recommended this be up to 80 mg 2 times a  day for the patient as he did appear to be fluid overloaded. With that being said the patient states he did this yesterday and he was unable to go anywhere or do anything due to the fact that he was constantly having to urinate. Nonetheless I think that this is still good to be something that is important for him as far as trying to get his edema under control at all things that he is going to be able to just expect his wounds to get under control and things to be better without going through at least a period of time where he is trying to stabilize his fluid management in general and I think increasing the Lasix is likely the first step here. It was also mentioned the possibility that the patient may require metolazone. With that being said he wanted to have the patient take Lasix twice a day first and then reevaluating 2 months to see where things stand. 12/15/2019 upon evaluation today patient appears to be doing regard to his legs although his toes are showing some signs of weeping especially on the left at this point to some degree on the right. There does not appear to be any signs of active infection and overall I do feel like the compression wraps are doing well for him but he has not been able to take the Lasix at home and the increased dose that Dr. Debara Pickett recommended. He tells me that just not go to be feasible for him. Nonetheless I think in this case he should probably send a message to Dr. Debara Pickett in order to discuss options from the standpoint of possible admission to get the fluid off or otherwise going forward. 12/22/2019 upon evaluation today patient appears to be doing fairly well with regard to his lower extremities at this point. In fact he would be doing excellent if it was not for the fact that his right anterior thigh apparently had an allergic reaction to adhesive tape that he used. The wound itself that we have been monitoring actually appears to be healed. There is a lot of  irritation at this point. 12/29/2019 upon evaluation today patient appears to  be doing well in regard to his lower extremities. His left medial thigh is open and somewhat draining today but this is the only region that is open the right has done much better with the treatment utilizing the steroid cream that I prescribed for him last week. Overall I am pleased in that regard. Fortunately there is no signs of active infection at this time. No fevers, chills, nausea, vomiting, or diarrhea. 01/05/2020 upon evaluation today patient appears to be doing more poorly in regard to his right lower extremity at this point upon evaluation today. Unfortunately he continues to have issues in this regard and I think the biggest issue is controlling his edema. This obviously is not very well controlled at this point is been recommended that he use the Lasix twice a day but he has not been able to do that. Unfortunately I think this is leading to an issue where honestly he is not really able to effectively control his edema and therefore the wounds really are not doing significantly better. I do not think that he is going to be able to keep things under good control unless he is able to control his edema much better. I discussed this again in great detail with him today. 01/12/2020 good news is patient actually appears to be doing quite well today at this point. He does have an appointment with lymphedema clinic tomorrow. His legs appear healed and the toe on the left is almost completely healed. In general I am very pleased with how things stand at this point. 01/19/2020 upon evaluation today patient appears to actually be doing well in regard to his lower extremities there is nothing open at this point. Fortunately he has done extremely well more recently. Has been seeing lymphedema clinic as well. With that being said he has Velcro wraps for his lower legs as well as his upper legs. The only wound really is on his toe  which is the right great toe and this is barely anything even there. With all that being said I think it is good to be appropriate today to go ahead and switch him over to the Velcro compression wraps. 01/26/2020 upon evaluation today patient appears to be doing worse with regard to his lower extremities after last week switch him to Velcro compression wraps. Unfortunately he lasted less than 24 hours he did not have the sock portion of his Velcro wrap on the left leg and subsequently developed a blister underneath the Velcro portion. Obviously this is not good and not what we were looking for at this point. He states the lymphedema clinic did tell him to wear the wrap for 23 hours and take him off for 1 I am okay with that plan but again right now we got a get things back under control again he may have some cellulitis noted as well. 02/02/2020 upon evaluation today patient unfortunately appears to have several areas of blistering on his bilateral lower extremities today mainly on the feet. His legs do seem to be doing somewhat better which is good news. Fortunately there is no evidence of active infection at this time. No fevers, chills, nausea, vomiting, or diarrhea. 02/16/2020 upon evaluation today patient appears to be doing well at this time with regard to his legs. He has a couple weeping areas on his toes but for the most part everything is doing better and does appear to be sealed up on his legs which is excellent news. We can continue with wrapping him  at this point as he had every time we discontinue the wraps he just breaks out with new wounds. There is really no point in is going forward with this at this point. 03/08/2020 upon evaluation today patient actually appears to be doing quite well with regard to his lower extremity ulcers. He has just a very superficial and really almost nonexistent blister on the left lower extremity he has in general done very well with the compression wraps. With  that being said I do not see any signs of infection at this time which is good news. 03/29/2020 upon evaluation today patient appears to be doing well with regard to his wounds currently except for where he had several new areas that opened up due to some of the wrap slipping and causing him trouble. He states he did not realize they had slipped. Nonetheless he has a 1 area on the right and 3 new areas on the left. Fortunately there is no signs of active infection at this time which is great news. 04/05/2020 upon evaluation today patient actually appears to be doing quite well in general in regard to his legs currently. Fortunately there is no signs of active infection at this time. No fevers, chills, nausea, vomiting, or diarrhea. He tells me next week that he will actually be seen in the lymphedema clinic on Thursday at 10 AM I see him on Wednesday next week. 04/12/2020 upon evaluation today patient appears to be doing very well with regard to his lower extremities bilaterally. In fact he does not appear to have any open wounds at this point which is good news. Fortunately there is no signs of active infection at this time. No fevers, chills, nausea, vomiting, or diarrhea. 04/19/2020 upon evaluation today patient appears to be doing well with regard to his wounds currently on the bilateral lower extremities. There does not appear to be any signs of active infection at this time. Fortunately there is no evidence of systemic infection and overall very pleased at this point. Nonetheless after I held him out last week he literally had blisters the next morning already which swelled up with him being right back here in the clinic. Overall I think that he is just not can be able to be discharged with his legs the way they are he is much to volume overloaded as far as fluid is concerned and that was discussed with him today of also discussed this but should try the clinic nurse manager as well as Dr.  Leanord Hawking. 04/26/2020 upon evaluation today patient appears to be doing better with regard to his wounds currently. He is making some progress and overall swelling is under good control with the compression wraps. Fortunately there is no evidence of active infection at this time. 05/10/2020 on evaluation today patient appears to be doing overall well in regard to his lower extremities bilaterally. He is Tolerating the compression wraps without complication and with what we are seeing currently I feel like that he is making excellent progress. There is no signs of active infection at this time. 05/24/2020 upon evaluation today patient appears to be doing well in regard to his legs. The swelling is actually quite a bit down compared to where it has been in the past. Fortunately there is no sign of active infection at this time which is also good news. With that being said he does have several wounds on his toes that have opened up at this point. Electronic Signature(s) Signed: 05/24/2020 6:34:32 PM By:  Linwood Dibbles, Taje Tondreau PA-C Entered By: Lenda Kelp on 05/24/2020 18:34:32 -------------------------------------------------------------------------------- Physical Exam Details Patient Name: Date of Service: TYTON, ABDALLAH 05/24/2020 10:30 A M Medical Record Number: [id number] Patient Account Number: [id number] Date of Birth/Sex: Treating RN: June 05, 1951 (69 y.o. [name] Primary Care Provider: Marney Setting, [name] Other Clinician: Referring Provider: Treating Provider/Extender: Lenda Kelp [facility] [name], [name] in Treatment: 226 Constitutional Obese and well-hydrated in no acute distress. Respiratory normal breathing without difficulty. Psychiatric this patient is able to make decisions and demonstrates good insight into disease process. Alert and Oriented x 3. pleasant and cooperative. Notes Patient's wound bed currently showed signs in regard to the toes of being fairly superficial at all  locations which is good news. There does not appear to be any signs of active infection also good news. In general and very pleased with where things stand today. The patient likewise is happy that things are doing better with regard to his swelling though of course he is not as pleased with the fact that his toes are give him trouble at this point. Electronic Signature(s) Signed: 05/24/2020 6:35:25 PM By: Lenda Kelp PA-C Entered By: Lenda Kelp on 05/24/2020 18:35:25 -------------------------------------------------------------------------------- Physician Orders Details Patient Name: Date of Service: [name], Hasnain J. 05/24/2020 10:30 A M Medical Record Number: [id number] Patient Account Number: [id number] Date of Birth/Sex: Treating RN: August 15, 1951 (69 y.o. 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 Dressing Change Frequency Do not change entire dressing for one week. - both legs Skin Barriers/Peri-Wound Care Moisturizing lotion - both legs Wound Cleansing May shower with protection. Primary Wound Dressing Wound #174 Left,Dorsal Foot Calcium Alginate with Silver Wound #177 Right T Great oe Calcium Alginate with Silver Wound #178 Right T  Second oe Calcium Alginate with Silver Wound #179 Left T Fourth oe Calcium Alginate with Silver Secondary Dressing Wound #174 Left,Dorsal Foot Dry Gauze Wound #177 Right T Great oe Dry Gauze Wound #178 Right T Second oe Dry Gauze Wound #179 Left T Fourth oe Dry Gauze Edema Control 4 layer compression: Left lower extremity Unna Boot to Right Lower Extremity Avoid standing for long periods of time Elevate legs to the level of the heart or above for 30 minutes daily and/or when sitting, a frequency of: - throughout the day Exercise regularly Electronic Signature(s) Signed: 05/24/2020 6:54:26 PM By: Lenda Kelp PA-C Signed: 05/25/2020 4:56:58 PM By: Zenaida Deed RN, BSN Signed: 05/25/2020 4:56:58 PM By: Zenaida Deed RN, BSN Entered By: Zenaida Deed on 05/24/2020 11:18:41 -------------------------------------------------------------------------------- Problem List Details Patient Name: Date of Service: [facility], Keshaun J. 05/24/2020 10:30 A M Medical Record Number: [id number] Patient Account Number: [id number] Date of Birth/Sex: Treating RN: 1951-05-15 (69 y.o. [name] Primary Care Provider: Marney Setting, [name] Other Clinician: Referring Provider: Treating Provider/Extender: Lenda Kelp [facility] [name], [name] in Treatment: 226 Active Problems ICD-10 Encounter Code Description  Active Date MDM Diagnosis 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 skin 06/30/2018 No Yes L97.221 Non-pressure chronic ulcer of left calf limited to breakdown of skin 09/30/2016 No Yes 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 of 01/22/2016 No Yes bilateral lower extremity I89.0 Lymphedema, not elsewhere classified 01/22/2016 No Yes E11.622 Type 2 diabetes mellitus with other skin ulcer 01/22/2016 No Yes 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, unspecified 01/22/2016 No Yes L03.116 Cellulitis of left  lower limb 04/01/2017 No Yes Inactive Problems ICD-10 Code Description Active Date Inactive Date 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 skin 06/30/2017 06/30/2017 L97.521 Non-pressure chronic ulcer of other 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 of skin 04/27/2018 04/27/2018 L03.115 Cellulitis of right lower limb 12/22/2017 12/22/2017 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 06/30/2018 06/30/2018 L97.511 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 of skin 06/30/2018 06/30/2018 Resolved Problems Electronic Signature(s) Signed: 05/24/2020 11:08:32 AM By: Lenda Kelp PA-C Entered By: Lenda Kelp on 05/24/2020 11:08:31 -------------------------------------------------------------------------------- Progress Note Details Patient Name: Date of Service: [name], Edger J. 05/24/2020 10:30 A M Medical Record Number: [id number] Patient Account Number: [id number] Date of Birth/Sex: Treating RN: 03/01/1951 (69 y.o. [name] Primary Care Provider: Marney Setting, [name] Other Clinician: Referring Provider: Treating Provider/Extender: Lenda Kelp [facility] [name], [name] in Treatment: 226 Subjective Chief Complaint Information obtained from Patient patient is here for evaluation venous/lymphedema weeping History of Present Illness (HPI) Referred by PCP for consultation. Patient has long standing history of BLE venous stasis, no prior ulcerations. At beginning of month, developed cellulitis and weeping. Received IM Rocephin followed by Keflex and resolved. Wears compression stocking, appr 6 months old. Not sure strength. No present drainage. 01/22/16 this is a patient who is a type II diabetic on insulin. 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 He tells me he religiously wears pressure stockings of uncertain strength. He was here with weeping edema about 8 months  ago but did not have an open wound. Roughly a month ago he had a reopening on his bilateral legs. He is been using bandages and Neosporin. He does not complain of pain. 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 Last BUN/creatinine I have is from 11/20/15 at 13 and 1.0 respectively 01/29/16; patient arrives today having tolerated the Profore wrap. He brought in his stockings and these are 18 mmHg stockings he bought from [employer]. The compression here is likely inadequate. He does not complain of pain or excessive drainage she has no systemic symptoms. The wound on the right looks improved as does the one on the left although one on the left is more substantial with still tissue at risk below the actual wound area on the bilateral posterior calf 02/05/16; patient arrives with poor edema control. He states that we did put a 4 layer compression on it last week. No weight appear 5 this. 02/12/16; the area on the posterior right Has healed. The left Has a substantial wound that has necrotic surface eschar that requires a debridement with a curette. 02/16/16;the patient called or a Nurse visit secondary to increased swelling. He had been in earlier in the week with his right leg healed. He was transitioned to is on pressure stocking on the right leg  with the only open wound on the left, a substantial area on the left posterior calf. Note he has a history of severe lower extremity edema, he has a history of chronic atrial fibrillation but not heart failure per my notes but I'll need to research this. He is not complaining of chest pain shortness of breath or orthopnea. The intake nurse noted blisters on the previously closed right leg 02/19/16; this is the patient's regular visit day. I see him on Friday with escalating edema new wounds on the right leg and clear signs of at least right ventricular heart  failure. I increased his Lasix to 40 twice a day. He is returning currently in follow-up. States he is noticed a decrease in that the edema 02/26/16 patient's legs have much less edema. There is nothing really open on the right leg. The left leg has improved condition of the large superficial wound on the posterior left leg 03/04/16; edema control is very much better. The patient's right leg wounds have healed. On the left leg he continues to have severe venous inflammation on the posterior aspect of the left leg. There is no tenderness and I don't think any of this is cellulitis. 03/11/16; patient's right leg is married healed and he is in his own stocking. The patient's left leg has deteriorated somewhat. There is a lot of erythema around the wound on the posterior left leg. There is also a significant rim of erythema posteriorly just above where the wrap would've ended there is a new wound in this location and a lot of tenderness. Can't rule out cellulitis in this area. 03/15/16; patient's right leg remains healed and he is in his own stocking. The patient's left leg is much better than last review. His major wound on the posterior aspect of his left Is almost fully epithelialized. He has 3 small injuries from the wraps. Really. Erythema seems a lot better on antibiotics 03/18/16; right leg remains healed and he is in his own stocking. The patient's left leg is much better. The area on the posterior aspect of the left calf is fully epithelialized. His 3 small injuries which were wrap injuries on the left are improved only one seems still open his erythema has resolved 03/25/16; patient's right leg remains healed and he is in his own stocking. There is no open area today on the left leg posterior leg is completely closed up. His wrap injuries at the superior aspect of his leg are also resolved. He looks as though he has some irritation on the dorsal ankle but this is fully epithelialized without evidence of  infection. 03/28/16; we discharged this patient on Monday. Transitioned him into his own stocking. There were problems almost immediately with uncontrolled swelling weeping edema multiple some of which have opened. He does not feel systemically unwell in particular no chest pain no shortness of breath and he does not feel 04/08/16; the edema is under better control with the Profore light wrap but he still has pitting edema. There is one large wound anteriorly 2 on the medial aspect of his left leg and 3 small areas on the superior posterior calf. Drainage is not excessive he is tolerating a Profore light well 04/15/16; put a Profore wrap on him last week. This is controlled is edema however he had a lot of pain on his left anterior foot most of his wounds are healed 04/22/16 once again the patient has denuded areas on the left anterior foot which he states are because  his wrap slips up word. He saw his primary physician today is on Lasix 40 twice a day and states that he his weight is down 20 pounds over the last 3 months. 04/29/16: Much improved. left anterior foot much improved. He is now on Lasix 80 mg per day. Much improved edema control 05/06/16; I was hoping to be able to discharge him today however once again he has blisters at a low level of where the compression was placed last week mostly on his left lateral but also his left medial leg and a small area on the anterior part of the left foot. 05/09/16; apparently the patient went home after his appointment on 7/4 later in the evening developing pain in his upper medial thigh together with subjective fever and chills although his temperature was not taken. The pain was so intense he felt he would probably have to call 911. However he then remembered that he had leftover doxycycline from a previous round of antibiotics and took these. By the next morning he felt a lot better. He called and spoke to one of our nurses and I approved doxycycline over the  phone thinking that this was in relation to the wounds we had previously seen although they were definitely were not. The patient feels a lot better old fever no chills he is still working. Blood sugars are reasonably controlled 05/13/16; patient is back in for review of his cellulitis on his anterior medial upper thigh. He is taking doxycycline this is a lot better. Culture I did of the nodular area on the dorsal aspect of his foot grew MRSA this also looks a lot better. 05/20/16; the patient is cellulitis on the medial upper thigh has resolved. All of his wound areas including the left anterior foot, areas on the medial aspect of the left calf and the lateral aspect of the calf at all resolved. He has a new blister on the left dorsal foot at the level of the fourth toe this was excised. No evidence of infection 05/27/16; patient continues to complain weeping edema. He has new blisterlike wounds on the left anterior lateral and posterior lateral calf at the top of his wrap levels. The area on his left anterior foot appears better. He is not complaining of fever, pain or pruritus in his feet. 05/30/16; the patient's blisters on his left anterior leg posterior calf all look improved. He did not increase the Lasix 100 mg as I suggested because he was going to run out of his 40 mg tablets. He is still having weeping edema of his toes 06/03/16; I renewed his Lasix at 80 mg once a day as he was about to run out when I last saw him. He is on 80 mg of Lasix now. I have asked him to cut down on the excessive amount of water he was drinking and asked him to drink according to his thirst mechanisms 06/12/2016 -- was seen 2 days ago and was supposed to wear his compression stockings at home but he is developed lymphedema and superficial blisters on the left lower extremity and hence came in for a review 06/24/16; the remaining wound is on his left anterior leg. He still has edema coming from between his toes. There is  lymphedema here however his edema is generally better than when I last saw this. He has a history of atrial fibrillation but does not have a known history of congestive heart failure nevertheless I think he probably has this at least  on a diastolic basis. 07/01/16 I reviewed his echocardiogram from January 2017. This was essentially normal. He did not have LVH, EF of 55-60%. His right ventricular function was normal although he did have trivial tricuspid and pulmonic regurgitation. This is not audible on exam however. I increased his Lasix to do massive edema in his legs well above his knees I think in early July. He was also drinking an excessive amount of water at the time. 07/15/16; missed his appointment last week because of the Labor Day holiday on Monday. He could not get another appointment later in the week. Started to feel the wrap digging in superiorly so we remove the top half and the bottom half of his wrap. He has extensive erythema and blistering superiorly in the left leg. Very tender. Very swollen. Edema in his foot with leaking edema fluid. He has not been systemically unwell 07/22/16; the area on the left leg laterally required some debridement. The medial wounds look more stable. His wrap injury wounds appear to have healed. Edema and his foot is better, weeping edema is also better. He tells me he is meeting with the supplier of the external compression pumps at work 08/05/16; the patient was on vacation last week in [facility]. His wrap is been on for an extended period of time. Also over the weekend he developed an extensive area of tender erythema across his anterior medial thigh. He took to doxycycline yesterday that he had leftover from a previous prescription. The patient complains of weeping edema coming out of his toes 08/08/16; I saw this patient on 10/2. He was tender across his anterior thigh. I put him on doxycycline. He returns today in follow-up. He does not have  any open wounds on his lower leg, he still has edema weeping into his toes. 08/12/16; patient was seen back urgently today to follow-up for his extensive left thigh cellulitis/erysipelas. He comes back with a lot less swelling and erythema pain is much better. I believe I gave him Augmentin and Cipro. His wrap was cut down as he stated a roll down his legs. He developed blistering above the level of the wrap that remained. He has 2 open blisters and 1 intact. 08/19/16; patient is been doing his primary doctor who is increased his Lasix from 40-80 once a day or 80 already has less edema. Cellulitis has remained improved in the left thigh. 2 open areas on the posterior left calf 08/26/16; he returns today having new open blisters on the anterior part of his left leg. He has his compression pumps but is not yet been shown how to use some vital representative from the supplier. 09/02/16 patient returns today with no open wounds on the left leg. Some maceration in his plantar toes 09/10/2016 -- Dr. Leanord Hawking had recently discharged him on 09/02/2016 and he has come right back with redness swelling and some open ulcers on his left lower extremity. He says this was caused by trying to apply his compression stockings and he's been unable to use this and has not been able to use his lymphedema pumps. He had some doxycycline leftover and he has started on this a few days ago. 09/16/16; there are no open wounds on his leg on the left and no evidence of cellulitis. 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 He does not complain of symptoms here. I am not clear use using his external compression pumps. 09/23/16; I have not  seen this patient in 2 weeks. He canceled his appointment 10 days ago as he was going on vacation. He tells me that on Monday he noticed a large area on his posterior left leg which is been draining copiously and is reopened into a large  wound. He is been using ABDs and the external part of his juxtalite, according to our nurse this was not on properly. 10/07/16; Still a substantial area on the posterior left leg. Using silver alginate 10/14/16; in general better although there is still open area which looks healthy. Still using silver alginate. He reminds me that this happen before he left for [facility]. T oday while he was showering in the morning. He had been using his juxtalite's 10/21/16; the area on his posterior left leg is fully epithelialized. However he arrives today with a large area of tender erythema in his medial and posterior left thigh just above the knee. I have marked the area. Once again he is reluctant to consider hospitalization. I treated him with oral antibiotics in the past for a similar situation with resolution I think with doxycycline however this area it seems more extensive to me. He is not complaining of fever but does have chills and says states he is thirsty. His blood sugar today was in the 140s at home 10/25/16 the area on his posterior left leg is fully epithelialized although there is still some weeping edema. The large area of tenderness and erythema in his medial and posterior left thigh is a lot less tender although there is still a lot of swelling in this thigh. He states he feels a lot better. He is on doxycycline and Augmentin that I started last week. This will continued until Tuesday, December 26. I have ordered a duplex ultrasound of the left thigh rule out DVT whether there is an abscess something that would need to be drained I would also like to know. 11/01/16; he still has weeping edema from a not fully epithelialized area on his left posterior calf. Most of the rest of this looks a lot better. He has completed his antibiotics. His thigh is a lot better. Duplex ultrasound did not show a DVT in the thigh 11/08/16; he comes in today with more Denuded surface epithelium from the posterior  aspect of his calf. There is no real evidence of cellulitis. The superior aspect of his wrap appears to have put quite an indentation in his leg just below the knee and this may have contributed. He does not complain of pain or fever. We have been using silver alginate as the primary dressing. The area of cellulitis in the right thigh has totally resolved. He has been using his compression stockings once a week 11/15/16; the patient arrives today with more loss of epithelium from the posterior aspect of his left calf. He now has a fairly substantial wound in this area. The reason behind this deterioration isn't exactly clear although his edema is not well controlled. He states he feels he is generally more swollen systemically. He is not complaining of chest pain shortness of breath fever. T me he has an appointment with his primary physician in early February. He is on 80 mg of oral ells Lasix a day. He claims compliance with the external compression pumps. He is not having any pain in his legs similar to what he has with his recurrent cellulitis 11/22/16; the patient arrives a follow-up of his large area on his left lateral calf. This looks somewhat better  today. He came in earlier in the week for a dressing change since I saw him a week ago. He is not complaining of any pain no shortness of breath no chest pain 11/28/16; the patient arrives for follow-up of his large area on the left lateral calf this does not look better. In fact it is larger weeping edema. The surface of the wound does not look too bad. We have been using silver alginate although I'm not certain that this is a dressing issue. 12/05/16; again the patient follows up for a large wound on the left lateral and left posterior calf this does not look better. There continues to be weeping edema necrotic surface tissue. More worrisome than this once again there is erythema below the wound involving the distal Achilles and heel suggestive of  cellulitis. He is on his feet working most of the day of this is not going well. We are changing his dressing twice a week to facilitate the drainage. 12/12/16; not much change in the overall dimensions of the large area on the left posterior calf. This is very inflamed looking. I gave him an. Doxycycline last week does not really seem to have helped. He found the wrap very painful indeed it seems to of dog into his legs superiorly and perhaps around the heel. He came in early today because the drainage had soaked through his dressings. 12/19/16- patient arrives for follow-up evaluation of his left lower extremity ulcers. He states that he is using his lymphedema pumps once daily when there is "no drainage". He admits to not using his lipedema pumps while under current treatment. His blood sugars have been consistently between 150-200. 12/26/16; the patient is not using his compression pumps at home because of the wetness on his feet. I've advised him that I think it's important for him to use this daily. He finds his feet too wet, he can put a plastic bag over his legs while he is in the pumps. Otherwise I think will be in a vicious circle. We are using silver alginate to the major area on his left posterior calf 01/02/17; the patient's posterior left leg has further of all into 3 open wounds. All of them covered with a necrotic surface. He claims to be using his compression pumps once a day. His edema control is marginal. Continue with silver alginate 01/10/17; the patient's left posterior leg actually looks somewhat better. There is less edema, less erythema. Still has 3 open areas covered with a necrotic surface requiring debridement. He claims to be using his compression pumps once a day his edema control is better 01/17/17; the patient's left posterior calf look better last week when I saw him and his wrap was changed 2 days ago. He has noted increasing pain in the left heel and arrives today with much  larger wounds extensive erythema extending down into the entire heel area especially tender medially. He is not systemically unwell CBGs have been controlled no fever. Our intake nurse showed me limegreen drainage on his AVD pads. 01/24/17; his usual this patient responds nicely to antibiotics last week giving him Levaquin for presumed Pseudomonas. The whole entire posterior part of his leg is much better much less inflamed and in the case of his Achilles heel area much less tender. He has also had some epithelialization posteriorly there are still open areas here and still draining but overall considerably better 01/31/17- He has continue to tolerate the compression wraps. he states that he continues to use  the lymphedema pumps daily, and can increase to twice daily on the weekends. He is voicing no complaints or concerns regarding his LLE ulcers 02/07/17-he is here for follow-up evaluation. He states that he noted some erythema to the left medial and anterior thigh, which he states is new as of yesterday. He is concerned about recurrent cellulitis. He states his blood sugars have been slightly elevated, this morning in the 180s 02/14/17; he is here for follow-up evaluation. When he was last here there was erythema superiorly from his posterior wound in his anterior thigh. He was prescribed Levaquin however a culture of the wound surface grew MRSA over the phone I changed him to doxycycline on Monday and things seem to be a lot better. 02/24/17; patient missed his appointment on Friday therefore we changed his nurse visit into a physician visit today. Still using silver alginate on the large area of the posterior left thigh. He isn't new area on the dorsal left second toe 03/03/17; actually better today although he admits he has not used his external compression pumps in the last 2 days or so because of work responsibilities over the weekend. 03/10/17; continued improvement. External compression pumps once a  day almost all of his wounds have closed on the posterior left calf. Better edema control 03/17/17; in general improved. He still has 3 small open areas on the lateral aspect of his left leg however most of the area on the posterior part of his leg is epithelialized. He has better edema control. He has an ABD pad under his stocking on the right anterior lower leg although he did not let us look at that today. 03/24/17; patient arrives back in clinic today with no open areas however there are areas on the posterior left calf and anterior left calf that are less than 100% epithelialized. His edema is well controlled in the left lower leg. There is some pitting edema probably lymphedema in the left upper thigh. He uses compression pumps at home once per day. I tried to get him to do this twice a day although he is very reticent. 04/01/2017 -- for the last 2 days he's had significant redness, tenderness and weeping and came in for an urgent visit today. 04/07/17; patient still has 6 more days of doxycycline. He was seen by Dr. Meyer Russel last Wednesday for cellulitis involving the posterior aspect, lateral aspect of his Involving his heel. For the most part he is better there is less erythema and less weeping. He has been on his feet for 12 hours o2 over the weekend. Using his compression pumps once a day 04/14/17 arrives today with continued improvement. Only one area on the posterior left calf that is not fully epithelialized. He has intense bilateral venous inflammation associated with his chronic venous insufficiency disease and secondary lymphedema. We have been using silver alginate to the left posterior calf wound In passing he tells [place] today that the right leg but we have not seen in quite some time has an open area on it but he doesn't want [place] to look at this today states he will show this to [place] next week. 04/21/17; there is no open area on his left leg although he still reports some weeping edema. He  showed [place] his right leg today which is the first time we've seen this leg in a long time. He has a large area of open wound on the right leg anteriorly healthy granulation. Quite a bit of swelling in the right leg  and some degree of venous inflammation. He told us about the right leg in passing last week but states that deterioration in the right leg really only happened over the weekend 04/28/17; there is no open area on the left leg although there is an irritated part on the posterior which is like a wrap injury. The wound on the right leg which was new from last week at least to [place] is a lot better. 05/05/17; still no open area on the left leg. Patient is using his new compression stocking which seems to be doing a good job of controlling the edema. He states he is using his compression pumps once per day. The right leg still has an open wound although it is better in terms of surface area. Required debridement. A lot of pain in the posterior right Achilles marked tenderness. Usually this type of presentation this patient gives concern for an active cellulitis 05/12/17; patient arrives today with his major wound from last week on the right lateral leg somewhat better. Still requiring debridement. He was using his compression stocking on the left leg however that is reopened with superficial wounds anteriorly he did not have an open wound on this leg previously. He is still using his juxta light's once daily at night. He cannot find the time to do this in the morning as he has to be at work by 7 AM 05/19/17; right lateral leg wound looks improved. No debridement required. The concerning area is on the left posterior leg which appears to almost have a subcutaneous hemorrhagic component to it. We've been using silver alginate to all the wounds 05/26/17; the right lateral leg wound continues to look improved. However the area on the left posterior calf is a tightly adherent surface. [place] using  silver alginate. Because of the weeping edema in his legs there is very little good alternatives. 06/02/17; the patient left here last week looking quite good. Major wound on the left posterior calf and a small one on the right lateral calf. Both of these look satisfactory. He tells me that by Wednesday he had noted increased pain in the left leg and drainage. He called on Thursday and Friday to get an appointment here but we were blocked. He did not go to urgent care or his primary physician. He thinks he had a fever on Thursday but did not actually take his temperature. He has not been using his compression pumps on the left leg because of pain. I advised him to go to the emergency room today for IV antibiotics for stents of left leg cellulitis but he has refused I have asked him to take 2 days off work to keep his leg elevated and he has refused this as well. In view of this I'm going to call him and Augmentin and doxycycline. 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 However after a detailed discussion regarding the risk benefits and alternatives he refuses to get admitted to the hospital. With no other recourse I will continue him on oral antibiotics as before and hopefully he'll have his infectious disease consultation this week. 06/16/2017 -- the patient was seen today by the nurse practitioner at infectious disease Ms. Dixon. Her review noted recurrent cellulitis of the lower extremity  with tinea pedis of the left foot and she has recommended clindamycin 150 mg daily for now and she may increase it to 300 mg daily to cover staph and Streptococcus. He has also been advise Lotrimin cream locally. she also had wise IV antibiotics for his condition if it  flares up 06/23/17; patient arrives today with drainage bilaterally although the remaining wound on the left posterior calf after cleaning up today "highlighter yellow drainage" did not look too bad. Unfortunately he has had breakdown on the right anterior leg [previously this leg had not been open and he is using a black stocking] he went to see infectious disease and is been put on clindamycin 150 mg daily, I did not verify the dose although I'm not familiar with using clindamycin in this dosing range, perhaps for prophylaxisoo 06/27/17; I brought this patient back today to follow-up on the wound deterioration on the right lower leg together with surrounding cellulitis. I started him on doxycycline 4 days ago. This area looks better however he comes in today with intense cellulitis on the medial part of his left thigh. This is not have a wound in this area. Extremely tender. We've been using silver alginate to the wounds on the right lower leg left lower leg with bilateral 4 layer compression he is using his external compression pumps once a day 07/04/17; patient's left medial thigh cellulitis looks better. He has not been using his compression pumps as his insert said it was contraindicated with cellulitis. His right leg continues to make improvements all the wounds are still open. We only have one remaining wound on the left posterior calf. Using silver alginate to all open areas. He is on doxycycline which I started a week ago and should be finishing I gave him Augmentin after Thursday's visit for the severe cellulitis on the left medial thigh which fortunately looks better 07/14/17; the patient's left medial thigh cellulitis has resolved. The cellulitis in his right lower calf on the right also looks better. All of his wounds are stable to improved we've been using silver alginate he has completed the antibiotics I have given him. He has clindamycin 150 mg once a day prescribed by infectious disease  for prophylaxis, I've advised him to start this now. We have been using bilateral Unna boots over silver alginate to the wound areas 07/21/17; the patient is been to see infectious disease who noted his recurrent problems with cellulitis. He was not able to tolerate prophylactic clindamycin therefore he is on amoxicillin 500 twice a day. He also had a second daily dose of Lasix added By Dr. Oneta Rack but he is not taking this. Nor is he being completely compliant with his compression pumps a especially not this week. He has 2 remaining wounds one on the right posterior lateral lower leg and one on the left posterior medial lower leg. 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 The patient has Unna boots bilaterally. 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 He has bilateral Unna boots for compression and silver alginate to his wounds. Arrives today with his legs looking as good as I have seen him in quite some time. Not surprisingly his wounds look better as well with improvement on the right lateral leg venous insufficiency wound and also the left  medial leg. He is still using the compression pumps once a day 08/11/17; both legs appear to be doing better wounds on the right lateral and left medial legs look better. Skin on the right leg quite good. He is been using silver alginate as the primary dressing. I'm going to use Anasept gel calcium alginate and maintain all the secondary dressings 08/18/17; the patient continues to actually do quite well. The area on his right lateral leg is just about closed the left medial also looks better although it is still moist in this area. His edema is well controlled we have been using Anasept gel with calcium alginate and the usual secondary  dressings, 4 layer compression and once daily use of his compression pumps "always been able to manage 09/01/17; the patient continues to do reasonably well in spite of his trip to [place]. The area on the right lateral leg is epithelialized. Left is much better but still open. He has more edema and more chronic erythema on the left leg [venous inflammation] 09/08/17; he arrives today with no open wound on the right lateral leg and decently controlled edema. Unfortunately his left leg is not nearly as in his good situation as last week.he apparently had increasing edema starting on Saturday. He edema soaked through into his foot so used a plastic bag to walk around his home. The area on the medial right leg which was his open area is about the same however he has lost surface epithelium on the left lateral which is new and he has significant pain in the Achilles area of the left foot. He is already on amoxicillin chronically for prophylaxis of cellulitis in the left leg 09/15/17; he is completed a week of doxycycline and the cellulitis in the left posterior leg and Achilles area is as usual improved. He still has a lot of edema and fluid soaking through his dressings. There is no open wound on the right leg. He saw infectious disease NP today 09/22/17;As usual 1 we transition him from our compression wraps to his stockings things did not go well. He has several small open areas on the right leg. He states this was caused by the compression wrap on his skin although he did not wear this with the stockings over them. He has several superficial areas on the left leg medially laterally posteriorly. He does not have any evidence of active cellulitis especially involving the left Achilles The patient is traveling from [facility] Saturday going to [facility]. He states he isn't attempting to get an appointment with a heel objects wound center there to change his dressings. I am not completely certain  whether this will work 10/06/17; the patient came in on Friday for a nurse visit and the nurse reported that his legs actually look quite good. He arrives in clinic today for his regular follow-up visit. He has a new wound on his left third toe over the PIP probably caused by friction with his footwear. He has small areas on the left leg and a very superficial but epithelialized area on the right anterior lateral lower leg. Other than that his legs look as good as I've seen him in quite some time. We have been using silver alginate Review of systems; no chest pain no shortness of breath other than this a 10 point review of systems negative 10/20/17; seen by Dr. Meyer Russel last week. He had taken some antibiotics [doxycycline] that he had left over. Dr. Meyer Russel thought he had candida infection  and declined to give him further antibiotics. He has a small wound remaining on the right lateral leg several areas on the left leg including a larger area on the left posterior several left medial and anterior and a small wound on the left lateral. The area on the left dorsal third toe looks a lot better. ROS; Gen.; no fever, respiratory no cough no sputum Cardiac no chest pain other than this 10 point review of system is negative 10/30/17; patient arrives today having fallen in the bathtub 3 days ago. It took him a while to get up. He has pain and maceration in the wounds on his left leg which have deteriorated. He has not been using his pumps he also has some maceration on the right lateral leg. 11/03/17; patient continues to have weeping edema especially in the left leg. This saturates his dressings which were just put on on 12/27. As usual the doxycycline seems to take care of the cellulitis on his lower leg. He is not complaining of fever, chills, or other systemic symptoms. He states his leg feels a lot better on the doxycycline I gave him empirically. He also apparently gets injections at his primary doctor's  officeo Rocephin for cellulitis prophylaxis. I didn't ask him about his compression pump compliance today I think that's probably marginal. Arrives in the clinic with all of his dressings primary and secondary macerated full of fluid and he has bilateral edema 11/10/17; the patient's right leg looks some better although there is still a cluster of wounds on the right lateral. The left leg is inflamed with almost circumferential skin loss medially to laterally although we are still maintaining anteriorly. He does not have overt cellulitis there is a lot of drainage. He is not using compression pumps. We have been using silver alginate to the wound areas, there are not a lot of options here 11/17/17; the patient's right leg continues to be stable although there is still open wounds, better than last week. The inflammation in the left leg is better. Still loss of surface layer epithelium especially posteriorly. There is no overt cellulitis in the amount of edema and his left leg is really quite good, tells me he is using his compression pumps once a day. 11/24/17; patient's right leg has a small superficial wound laterally this continues to improve. The inflammation in the left leg is still improving however we have continuous surface layer epithelial loss posteriorly. There is no overt cellulitis in the amount of edema in both legs is really quite good. He states he is using his compression pumps on the left leg once a day for 5 out of 7 days 12/01/17; very small superficial areas on the right lateral leg continue to improve. Edema control in both legs is better today. He has continued loss of surface epithelialization and left posterior calf although I think this is better. We have been using silver alginate with large number of absorptive secondary dressings 4 layer on the left Unna boot on the right at his request. He tells me he is using his compression pumps once a day 12/08/17; he has no open area on the  right leg is edema control is good here. ooOn the left leg however he has marked erythema and tenderness breakdown of skin. He has what appears to be a wrap injury just distal to the popliteal fossa. This is the pattern of his recurrent cellulitis area and he apparently received penicillin at his primary physician's office really worked in  my view but usually response to doxycycline given it to him several times in the past 12/15/17; the patient had already deteriorated last Friday when he came in for his nurse check. There was swelling erythema and breakdown in the right leg. He has much worse skin breakdown in the left leg as well multiple open areas medially and posteriorly as well as laterally. He tells me he has been using his compression pumps but tells me he feels that the drainage out of his leg is worse when he uses a compression pumps. T be fair to him he is been saying this o for a while however I don't know that I have really been listening to this. I wonder if the compression pumps are working properly 12/22/17;. Once again he arrives with severe erythema, weeping edema from the left greater than right leg. Noncompliance with compression pumps. New this visit he is complaining of pain on the lateral aspect of the right leg and the medial aspect of his right thigh. He apparently saw his cardiologist Dr. [name] who was ordered an echocardiogram area and I think this is a step in the right direction 12/25/17; started his doxycycline Monday night. There is still intense erythema of the right leg especially in the anterior thigh although there is less tenderness. The erythema around the wound on the right lateral calf also is less tender. He still complaining of pain in the left heel. His wounds are about the same right lateral left medial left lateral. Superficial but certainly not close to closure. He denies being systemically unwell no fever chills no abdominal pain no diarrhea 12/29/17; back  in follow-up of his extensive right calf and right thigh cellulitis. I added amoxicillin to cover possible doxycycline resistant strep. This seems to of done the trick he is in much less pain there is much less erythema and swelling. He has his echocardiogram at 11:00 this morning. X-ray of the left heel was also negative. 01/05/18; the patient arrived with his edema under much better control. Now that he is retired he is able to use his compression pumps daily and sometimes twice a day per the patient. He has a wound on the right leg the lateral wound looks better. Area on the left leg also looks a lot better. He has no evidence of cellulitis in his bilateral thighs I had a quick peak at his echocardiogram. He is in normal ejection fraction and normal left ventricular function. He has moderate pulmonary hypertension moderately reduced right ventricular function. One would have to wonder about chronic sleep apnea although he says he doesn't snore. He'll review the echocardiogram with his cardiologist. 01/12/18; the patient arrives with the edema in both legs under exemplary control. He is using his compression pumps daily and sometimes twice daily. His wound on the right lateral leg is just about closed. He still has some weeping areas on the posterior left calf and lateral left calf although everything is just about closed here as well. I have spoken with Aldean Baker who is the patient's nurse practitioner and infectious disease. She was concerned that the patient had not understood that the parenteral penicillin injections he was receiving for cellulitis prophylaxis was actually benefiting him. I don't think the patient actually saw that I would tend to agree we were certainly dealing with less infections although he had a serious one last month. 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 He is healed. He'll be placed in juxtalite  compression wraps and increase  his lymphedema pumps to twice daily. We will follow up again next week to ensure there are no issues with the new regiment. 01/20/18-he is here for evaluation of bilateral lower extremity weeping edema. Yesterday he was placed in compression wrap to the right lower extremity and compression stocking to left lower shrubbery. He states he uses lymphedema pumps last night and again this morning and noted a blister to the left lower extremity. On exam he was noted to have drainage to the right lower extremity. He will be placed in Unna boots bilaterally and follow-up next week 01/26/18; patient was actually discharged a week ago to his own juxta light stockings only to return the next day with bilateral lower extremity weeping edema.he was placed in bilateral Unna boots. He arrives today with pain in the back of his left leg. There is no open area on the right leg however there is a linear/wrap injury on the left leg and weeping edema on the left leg posteriorly. I spoke with infectious disease about 10 days ago. They were disappointed that the patient elected to discontinue prophylactic intramuscular penicillin shots as they felt it was particularly beneficial in reducing the frequency of his cellulitis. I discussed this with the patient today. He does not share this view. He'll definitely need antibiotics today. Finally he is traveling to [place] and trauma leaving this Saturday and returning a week later and he does not travel with his pumps. He is going by car 01/30/18; patient was seen 4 days ago and brought back in today for review of cellulitis in the left leg posteriorly. I put him on amoxicillin this really hasn't helped as much as I might like. He is also worried because he is traveling to [facility] by car. Finally we will be rewrapping him. There is no open area on the right leg over his left leg has multiple weeping areas as usual 02/09/18; The same wrap on for 10 days. He did not pick up  the last doxycycline I prescribed for him. He apparently took 4 days worth he already had. There is nothing open on his right leg and the edema control is really quite good. He's had damage in the left leg medially and laterally especially probably related to the prolonged use of Unna boots 02/12/18; the patient arrived in clinic today for a nurse visit/wrap change. He complained of a lot of pain in the left posterior calf. He is taking doxycycline that I previously prescribed for him. Unfortunately even though he used his stockings and apparently used to compression pumps twice a day he has weeping edema coming out of the lateral part of his right leg. This is coming from the lower anterior lateral skin area. 02/16/18; the patient has finished his doxycycline and will finish the amoxicillin 2 days. The area of cellulitis in the left calf posteriorly has resolved. He is no longer having any pain. He tells me he is using his compression pumps at least once a day sometimes twice. 02/23/18; the patient finished his doxycycline and Amoxil last week. On Friday he noticed a small erythematous circle about the size of a quarter on the left lower leg just above his ankle. This rapidly expanded and he now has erythema on the lateral and posterior part of the thigh. This is bright red. Also has an area on the dorsal foot just above his toes and a tender area just below the left popliteal fossa. He came off his  prophylactic penicillin injections at his own insistence one or 2 months ago. This is obviously deteriorated since then 03/02/18; patient is on doxycycline and Amoxil. Culture I did last week of the weeping area on the back of his left calf grew group B strep. I have therefore renewed the amoxicillin 500 3 times a day for a further week. He has not been systemically unwell. Still complaining of an area of discomfort right under his left popliteal fossa. There is no open wound on the right leg. He tells me that  he is using his pumps twice a day on most days 03/09/18; patient arrives in clinic today completing his amoxicillin today. The cellulitis on his left leg is better. Furthermore he tells me that he had intramuscular penicillin shots that his primary care office today. However he also states that the wrap on his right leg fell down shortly after leaving clinic last week. He developed a large blister that was present when he came in for a nurse visit later in the week and then he developed intense discomfort around this area.He tells me he is using his compression pumps 03/16/18; the patient has completed his doxycycline. The infectious part of this/cellulitis in the left heel area left popliteal area is a lot better. He has 2 open areas on the right calf. Still areas on the left calf but this is a lot better as well. 03/24/18; the patient arrives complaining of pain in the left popliteal area again. He thinks some of this is wrap injury. He has no open area on the right leg and really no open area on the left calf either except for the popliteal area. He claims to be compliant with the compression pumps 03/31/18; I gave him doxycycline last week because of cellulitis in the left popliteal area. This is a lot better although the surface epithelium is denuded off and response to this. He arrives today with uncontrolled edema in the right calf area as well as a fingernail injury in the right lateral calf. There is only a few open areas on the left 04/06/18; I gave him amoxicillin doxycycline over the last 2 weeks that the amoxicillin should be completing currently. He is not complaining of any pain or systemic symptoms. The only open areas see has is on the right lateral lower leg paradoxically I cannot see anything on the left lower leg. He tells me he is using his compression pumps twice a day on most days. Silver alginate to the wounds that are open under 4 layer compression 04/13/18; he completed antibiotics  and has no new complaints. Using his compression pumps. Silver alginate that anything that's opened 04/20/18; he is using his compression pumps religiously. Silver alginate 4 layer compression anything that's opened. He comes in today with no open wounds on the left leg but 3 on the right including a new one posteriorly. He has 2 on the right lateral and one on the right posterior. He likes Unna boots on the right leg for reasons that aren't really clear we had the usual 4 layer compression on the left. It may be necessary to move to the 4 layer compression on the right however for now I left them in the Unna boots 04/27/18; he is using his compression pumps at least once a day. He has still the wounds on the right lateral calf. The area right posteriorly has closed. He does not have an open wound on the left under 4 layer compression however on the  dorsal left foot just proximal to the toes and the left third toe 2 small open areas were identified 05/11/18; he has not uses compression pumps. The areas on the right lateral calf have coalesced into one large wound necrotic surface. On the left side he has one small wound anteriorly however the edema is now weeping out of a large part of his left leg. He says he wasn't using his pumps because of the weeping fluid. I explained to him that this is the time he needs to pump more 05/18/18; patient states he is using his compression pumps twice a day. The area on the right lateral large wound albeit superficial. On the left side he has innumerable number of small new wounds on the left calf particularly laterally but several anteriorly and medially. All these appear to have healthy granulated base these look like the remnants of blisters however they occurred under compression. The patient arrives in clinic today with his legs somewhat better. There is certainly less edema, less multiple open areas on the left calf and the right anterior leg looks somewhat better  as well superficial and a little smaller. However he relates pain and erythema over the last 3-4 days in the thigh and I looked at this today. He has not been systemically unwell no fever no chills no change in blood sugar values 05/25/18; comes in today in a better state. The severe cellulitis on his left leg seems better with the Keflex. Not as tender. He has not been systemically unwell ooHard to find an open wound on the left lower leg using his compression pumps twice a day ooThe confluent wounds on his right lateral calf somewhat better looking. These will ultimately need debridement I didn't do this today. 06/01/18; the severe cellulitis on the left anterior thigh has resolved and he is completed his Keflex. ooThere is no open wound on the left leg however there is a superficial excoriation at the base of the third toe dorsally. Skin on the bottom of his left foot is macerated looking. ooThe left the wounds on the lateral right leg actually looks some better although he did require debridement of the top half of this wound area with an open curet 06/09/18 on evaluation today patient appears to be doing poorly in regard to his right lower extremity in particular this appears to likely be infected he has very thick purulent discharge along with a bright green tent to the discharge. This makes me concerned about the possibility of pseudomonas. He's also having increased discomfort at this point on evaluation. Fortunately there does not appear to be any evidence of infection spreading to the other location at this time. 06/16/18 on evaluation today patient appears to actually be doing fairly well. His ulcer has actually diminished in size quite significantly at this point which is good news. Nonetheless he still does have some evidence of infection he did see infectious disease this morning before coming here for his appointment. I did review the results of their evaluation and their note today. They  did actually have him discontinue the Cipro and initiate treatment with linezolid at this time. He is doing this for the next seven days and they recommended a follow-up in four months with them. He is the keep a log of the need for intermittent antibiotic therapy between now and when he falls back up with infectious disease. This will help them gaze what exactly they need to do to try and help them out. 06/23/18;  the patient arrives today with no open wounds on the left leg and left third toe healed. He is been using his compression pumps twice a day. On the right lateral leg he still has a sizable wound but this is a lot better than last time I saw this. 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 Has been using silver alginate under 4 layer compression 06/30/18; the only open wound he has is on the right lateral leg and this looks healthy. No debridement is required. We have been using silver alginate. He does not have an open wound on the left leg. There is apparently some drainage from the dorsal proximal third toe on the left although I see no open wound here. 07/03/18 on evaluation today patient was actually here just for a nurse visit rapid change. However when he was here on Wednesday for his rat change due to having been healed on the left and then developing blisters we initiated the wrap again knowing that he would be back today for [place] to reevaluate and see were at. Unfortunately he has developed some cellulitis into the proximal portion of his right lower extremity even into the region of his thigh. 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 He was placed on one as what at that point. Nonetheless he is done with that and has been tolerating it well otherwise. Doxycycline which in the past really did not seem to be effective for him. Nonetheless I  think the best option may be for [place] to definitely reinitiate the antibiotics for a longer period of time. 07/07/18; since I last saw this patient a week ago he has had a difficult time. At that point he did not have an open wound on his left leg. We transitioned him into juxta light stockings. He was apparently in the clinic the next day with blisters on the left lateral and left medial lower calf. He also had weeping edema fluid. He was put back into a compression wrap. He was also in the clinic on Friday with intense erythema in his right thigh. Per the patient he was started on Bactrim however that didn't work at all in terms of relieving his pain and swelling. He has taken 3 doxycycline that he had left over from last time and that seems to of helped. He has blistering on the right thigh as well. 07/14/18; the erythema on his right thigh has gotten better with doxycycline that he is finishing. 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 Presumably this cellulitis in the thigh was not related to that although I think this is a bit concerning going forward. He still has an area on the right lateral calf the blister on the right medial calf just below the knee that was discussed above. On the left 2 small open areas left medial and left lateral. Edema control is adequate. He is using his compression pumps twice a day 07/20/18; continued improvement in the condition of both legs especially the edema in his bilateral thighs. He tells me he is been losing weight through a combination of diet and exercise. He is using his compression pumps twice a day. So overall she made to the remaining wounds 07/27/2018; continued improvement in condition of both legs. His  edema is well controlled. The area on the right lateral leg is just about closed he had one blisters show up on the medial left upper calf. We have him in 4 layer compression. He is  going on a 10-[place] trip to [place]. He will be driving. He wants to wear Unna boots because of the lessening amount of constriction. He will not use compression pumps while he is away 08/05/18 on evaluation today patient actually appears to be doing decently well all things considered in regard to his bilateral lower extremities. The worst ulcer is actually only posterior aspect of his left lower extremity with a four layer compression wrap cut into his leg a couple weeks back. He did have a trip and actually had [employer] wraps for the trip that he is worn since he was last here. Nonetheless he feels like the [employer] wraps actually do better for him his swelling is up a little bit but he also with his trip was not taking his Lasix on a regular set schedule like he was supposed to be. He states that obviously the reason being that he cannot drive and keep going without having to urinate too frequently which makes it difficult. He did not have his pumps with him while he was away either which I think also maybe playing a role here too. 08/13/2018; the patient only has a small open wound on the right lateral calf which is a big improvement in the last month or 2. He also has the area posteriorly just below the posterior fossa on the left which I think was a wrap injury from several weeks ago. He has no current evidence of cellulitis. He tells me he is back into his compression pumps twice a day. He also tells me that while he was at the laundromat somebody stole a section of his extremitease stockings 08/20/2018; back in the clinic with a much improved state. He only has small areas on the right lateral mid calf which is just about healed. This was is more substantial area for quite a prolonged period of time. He has a small open area on the left anterior tibia. The area on the posterior calf just below the popliteal fossa is closed today. He is using his compression pumps  twice a day 08/28/2018; patient has no open wound on the right leg. He has a smattering of open areas on the calf with some weeping lymphedema. More problematically than that it looks as though his wraps of slipped down in his usual he has very angry upper area of edema just below the right medial knee and on the right lateral calf. He has no open area on his feet. [place] I will send him in an antibiotic. We will continue to wrap the right leg. We ordered extremitease stockings for him last week and I plan to transition the right leg to a stocking when he gets home which will be in 10 days time. As usual he is very reluctant to take his pumps with him when he travels 09/07/2018; [place] [facility] [place] He shows me a picture of his left leg in the mid part of his trip last week with intense fire engine erythema. The picture look bad enough I would have considered sending him to the hospital. Instead [facility]. They did not prescribe him antibiotics but he  did take some doxycycline he had leftover from a previous visit. I had given him trimethoprim sulfamethoxazole before he left this did not work according to the patient. This is resulted in some improvement fortunately. He comes back with a large wound on the left posterior calf. Smaller area on the left anterior tibia. Denuded blisters on the dorsal left foot over his toes. Does not have much in the way of wounds on the right leg although he does have a very tender area on the right posterior area just below the popliteal fossa also suggestive of infection. He promises me he is back on his pumps twice a day 09/15/2018; the intense cellulitis in his left lower calf is a lot better. The wound area on the posterior left calf is also so better. However he has reasonably extensive wounds on the dorsal aspect of his second and third toes and the proximal  foot just at the base of the toes. There is nothing open on the right leg 09/22/2018; the patient has excellent edema control in his legs bilaterally. He is using his external compression pumps twice a day. He has no open area on the right leg and only the areas in the left foot dorsally second and third toe area on the left side. He does not have any signs of active cellulitis. 10/06/2018; the patient has good edema control bilaterally. He has no open wound on the right leg. There is a blister in the posterior aspect of his left calf that we had to deal with today. He is using his compression pumps twice a day. There is no signs of active cellulitis. We have been using silver alginate to the wound areas. He still has vulnerable areas on the base of his left first second toes dorsally He has a his extremities stockings and we are going to transition him today into the stocking on the right leg. He is cautioned that he will need to continue to use the compression pumps twice a day. If he notices uncontrolled edema in the right leg he may need to go to 3 times a day. 10/13/2018; the patient came in for a nurse check on Friday he has a large flaccid blister on the right medial calf just below the knee. We unroofed this. He has this and a new area underneath the posterior mid calf which was undoubtedly a blister as well. He also has several small areas on the right which is the area we put his extremities stocking on. 10/19/2018; the patient went to see infectious disease this morning I am not sure if that was a routine follow-up in any case the doxycycline I had given him was discontinued and started on linezolid. He has not started this. It is easy to look at his left calf and the inflammation and think this is cellulitis however he is very tender in the tissue just below the popliteal fossa and I have no doubt that there is infection going on here. He states the problem he is having is that with the  compression pumps the edema goes down and then starts walking the wrap falls down. We will see if we can adhere this. He has 1 or 2 minuscule open areas on the right still areas that are weeping on the posterior left calf, the base of his left second and third toes 10/26/18; back today in clinic with quite of skin breakdown in his left anterior leg. This may have been infection the area below the  popliteal fossa seems a lot better however tremendous epithelial loss on the left anterior mid tibia area over quite inexpensive tissue. He has 2 blisters on the right side but no other open wound here. 10/29/2018; came in urgently to see [place] today and we worked him in for review. He states that the 4 layer compression on the right leg caused pain he had to cut it down to roughly his mid calf this caused swelling above the wrap and he has blisters and skin breakdown today. As a result of the pain he has not been using his pumps. Both legs are a lot more edematous and there is a lot of weeping fluid. 11/02/18; arrives in clinic with continued difficulties in the right leg> left. Leg is swollen and painful. multiple skin blisters and new open areas especially laterally. He has not been using his pumps on the right leg. 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 He is not systemically unwell. 11/09/2018; the patient claims he is being compliant with his pumps. He is finished the doxycycline I gave him last week. Culture I did of the wound on the right lateral leg showed a few very resistant methicillin staph aureus. This was resistant to doxycycline. Nevertheless he states the pain in the leg is a lot better which makes me wonder if the cultured organism was not really what was causing the problem nevertheless this is a very dangerous organism to be culturing out of any wound. His right leg is still a lot larger than the left. He is using an Radio broadcast assistant on this area, he  blames a 4-layer compression for causing the original skin breakdown which I doubt is true however I cannot talk him out of it. We have been using silver alginate to all of these areas which were initially blisters 11/16/2018; patient is being compliant with his external compression pumps at twice a day. Miraculously he arrives in clinic today with absolutely no open wounds. He has better edema control on the left where he has been using 4 layer compression versus wound of wounds on the right and I pointed this out to him. There is no inflammation in the skin in his lower legs which is also somewhat unusual for him. There is no open wounds on the dorsal left foot. He has extremitease stockings at home and I have asked him to bring these in next week. 11/25/18 patient's lower extremity on examination today on the left appears for the most part to be wound free. He does have an open wound on the lateral aspect of the right lower extremity but this is minimal compared to what I've seen in past. He does request that we go ahead and wrap the left leg as well even though there's nothing open just so hopefully it will not reopen in short order. 1/28; patient has superficial open wounds on the right lateral calf left anterior calf and left posterior calf. His edema control is adequate. 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 We have been using silver alginate as the primary dressing. He claims compliance with his compression pumps 2/4; patient has superficial open wounds on numerous areas of his left calf and again one on the left dorsal foot. The areas on the right lateral calf have healed. The cellulitis that I gave him doxycycline for last week is also resolved this was  mostly on the left anterior calf just below the tibial tuberosity. His edema looks fairly well-controlled. He tells me he went to see his primary doctor  today and had blood work ordered 2/11; once again he has several open areas on the left calf left tibial area. Most of these are small and appear to have healthy granulation. He does not have anything open on the right. The edema and control in his thighs is pretty good which is usually a good indication he has been using his pumps as requested. 2/18; he continues to have several small areas on the left calf and left tibial area. Most of these are small healthy granulation. We put him in his stocking on the right leg last week and he arrives with a superficial open area over the right upper tibia and a fairly large area on the right lateral tibia in similar condition. His edema control actually does not look too bad, he claims to be using his compression pumps twice a day 2/25. Continued small areas on the left calf and left tibial area. New areas especially on the right are identified just below the tibial tuberosity and on the right upper tibia itself. There are also areas of weeping edema fluid even without an obvious wound. He does not have a considerable degree of lymphedema but clearly there is more edema here than his skin can handle. He states he is using the pumps twice a day. We have an Unna boot on the right and 4 layer compression on the left. 3/3; he continues to have an area on the right lateral calf and right posterior calf just below the popliteal fossa. There is a fair amount of tenderness around the wound on the popliteal fossa but I did not see any evidence of cellulitis, could just be that the wrap came down and rubbed in this area. ooHe does not have an open area on the left leg however there is an area on the left dorsal foot at the base of the third toe ooWe have been using silver alginate to all wound areas 3/10; he did not have an open area on his left leg last time he was here a week ago. T oday he arrives with a horizontal wound just below the tibial tuberosity and an area  on the left lateral calf. He has intense erythema and tenderness in this area. The area is on the right lateral calf and right posterior calf better than last week. We have been using silver alginate as usual 3/18 - Patient returns with 3 small open areas on left calf, and 1 small open area on right calf, the skin looks ok with no significant erythema, he continues the UNA boot on right and 4 layer compression on left. The right lateral calf wound is closed , the right posterior is small area. we will continue silver alginate to the areas. 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 He is been tolerating the dressing changes without complication. Fortunately she has made excellent improvement in regard to the overall status of his wounds. Unfortunately every time we cease wrapping him he ends up reopening in causing more significant issues at that point. Again I'm unsure of the best direction to take although I think the lymphedema clinic may be appropriate for him. 02/03/19  on evaluation today patient appears to be doing well in regard to the wounds that we saw him for last week unfortunately he has a new area on the proximal portion of his right medial/posterior lower extremity where the wrap somewhat slowed down and caused swelling and a blister to rub and open. Unfortunately this is the only opening that he has on either leg at this point. 02/17/19 on evaluation today patient's bilateral lower extremities appear to be doing well. He still completely healed in regard to the left lower extremity. In regard to the right lower extremity the area where the wrap and slid down and caused the blister still seems to be slightly open although this is dramatically better than during the last evaluation two weeks ago.  I'm very pleased with the way this stands overall. 03/03/19 on evaluation today patient appears to be doing well in regard to his right lower extremity in general although he did have a new blister open this does not appear to be showing any evidence of active infection at this time. Fortunately there's No fevers, chills, nausea, or vomiting noted at this time. Overall I feel like he is making good progress it does feel like that the right leg will we perform the [employer] seems to do with a bit better than three layer wrap on the left which slid down on him. We may switch to doing bilateral in the book wraps. 5/4; I have not seen Mr. Murley in quite some time. According to our case manager he did not have an open wound on his left leg last week. He had 1 remaining wound on the right posterior medial calf. He arrives today with multiple openings on the left leg probably were blisters and/or wrap injuries from Unna boots. I do not think the Unna boot's will provide adequate compression on the left. I am also not clear about the frequency he is using the compression pumps. 03/17/19 on evaluation today patient appears to be doing excellent in regard to his lower extremities compared to last week's evaluation apparently. He had gotten significantly worse last week which is unfortunate. The [employer] wrap on the left did not seem to do very well for him at all and in fact it didn't control his swelling significantly enough he had an additional outbreak. Subsequently we go back to the four layer compression wrap on the left. This is good news. At least in that he is doing better and the wound seem to be killing him. He still has not heard anything from the lymphedema clinic. 03/24/19 on evaluation today patient actually appears to be doing much better in regard to his bilateral lower Trinity as compared to last week when I saw him. Fortunately there's no signs of active infection at this time. He has been  tolerating the dressing changes without complication. Overall I'm extremely pleased with the progress and appearance in general. 04/07/19 on evaluation today patient appears to be doing well in regard to his bilateral lower extremities. His swelling is significantly down from where it was previous. With that being said he does have a couple blisters still open at this point but fortunately nothing that seems to be too severe and again the majority of the larger openings has healed at this time. 04/14/19 on evaluation today patient actually appears to be doing quite well in regard to his bilateral lower extremities in fact I'm not even sure there's anything significantly open at this time at any site. Nonetheless  he did have some trouble with these wraps where they are somewhat irritating him secondary to the fact that he has noted that the graph wasn't too close down to the end of this foot in a little bit short as well up to his knee. Otherwise things seem to be doing quite well. 04/21/19 upon evaluation today patient's wound bed actually showed evidence of being completely healed in regard to both lower extremities which is excellent news. There does not appear to be any signs of active infection which is also good news. I'm very pleased in this regard. No fevers, chills, nausea, or vomiting noted at this time. 04/28/19 on evaluation today patient appears to be doing a little bit worse in regard to both lower extremities on the left mainly due to the fact that when he went infection disease the wrap was not wrapped quite high enough he developed a blister above this. On the right he is a small open area of nothing too significant but again this is continuing to give him some trouble he has been were in the Velcro compression that he has at home. 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 He's been tolerating the dressing changes  without complication. Fortunately there's no signs of active infection at this time. No fevers, chills, nausea, or vomiting noted at this time. We have been trying to get an appointment with her lymphedema clinic in [facility] but unfortunately nobody can get them on phone with not been able to even fax information over the patient likewise is not been able to get in touch with them. Overall I'm not sure exactly what's going on here with to reach out again today. 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 Still having a lot of drainage unfortunately. He tells me especially in the left but even on the right. There's no signs of active infection which is good news we've been using so ratcheted up to this point. 05/19/19 on evaluation today patient actually appears to be doing quite well with regard to his left lower extremity which is great news. Fortunately in regard to the right lower extremity has an issues with his wrap and he subsequently did remove this from what I'm understanding. Nonetheless long story short is what he had rewrapped once he removed it subsequently had maggots underneath this wrap whenever he came in for evaluation today. With that being said they were obviously completely cleaned away by the nursing staff. The visit today which is excellent news. However he does appear to potentially have some infection around the right ankle region where the maggots were located as well. He will likely require anabiotic therapy today. 05/26/19 on evaluation today patient actually appears to be doing much better in regard to his bilateral lower extremities. I feel like the infection is under much better control. With that being said there were maggots noted when the wrap was removed yet again today. Again this could have potentially been left over from previous although at this time there does not  appear to be any signs of significant drainage there was obviously on the wrap some drainage as well this contracted gnats or otherwise. Either way I do not see anything that appears to be doing worse in my pinion and in fact I think his drainage has slowed down quite significantly likely mainly due to the  fact to his infection being under better control. 06/02/2019 on evaluation today patient actually appears to be doing well with regard to his bilateral lower extremities there is no signs of active infection at this time which is great news. With that being said he does have several open areas more so on the right than the left but nonetheless these are all significantly better than previously noted. 06/09/2019 on evaluation today patient actually appears to be doing well. His wrap stayed up and he did not cause any problems he had more drainage on the right compared to the left but overall I do not see any major issues at this time which is great news. 06/16/2019 on evaluation today patient appears to be doing excellent with regard to his lower extremities the only area that is open is a new blister that can have opened as of today on the medial ankle on the left. Other than this he really seems to be doing great I see no major issues at this point. 06/23/2019 on evaluation today patient appears to be doing quite well with regard to his bilateral lower extremities. In fact he actually appears to be almost completely healed there is a small area of weeping noted of the right lower extremity just above the ankle. Nonetheless fortunately there is no signs of active infection at this time which is good news. No fevers, chills, nausea, vomiting, or diarrhea. 8/24; the patient arrived for a nurse visit today but complained of very significant pain in the left leg and therefore I was asked to look at this. Noted that he did not have an open area on the left leg last week nevertheless this was wrapped. The  patient states that he is not been able to put his compression pumps on the left leg because of the discomfort. He has not been systemically unwell 06/30/2019 on evaluation today patient unfortunately despite being excellent last week is doing much worse with regard to his left lower extremity today. In fact he had to come in for a nurse on Monday where his left leg had to be rewrapped due to excessive weeping Dr. Leanord Hawking placed him on doxycycline at that point. Fortunately there is no signs of active infection Systemically at this time which is good news. 07/07/2019 in regard to the patient's wounds today he actually seems to be doing well with his right lower extremity there really is nothing open or draining at this point this is great news. Unfortunately the left lower extremity is given him additional trouble at this time. There does not appear to be any signs of active infection nonetheless he does have a lot of edema and swelling noted at this point as well as blistering all of which has led to a much more poor appearing leg at this time compared to where it was 2 weeks ago when it was almost completely healed. Obviously this is a little discouraging for the patient. He is try to contact the lymphedema clinic in [place] he has not been able to get through to them. 07/14/2019 on evaluation today patient actually appears to be doing slightly better with regard to his left lower extremity ulcers. Overall I do feel like at least at the top of the wrap that we have been placing this area has healed quite nicely and looks much better. The remainder of the leg is showing signs of improvement. Unfortunately in the thigh area he still has an open region on the left and again on  the right he has been utilizing just a Band-Aid on an area that also opened on the thigh. Again this is an area that were not able to wrap although we did do an Ace wrap to provide some compression that something that obviously is  a little less effective than the compression wraps we have been using on the lower portion of the leg. He does have an appointment with the lymphedema clinic in [facility] on Friday. 07/21/2019 on evaluation today patient appears to be doing better with regard to his lower extremity ulcers. He has been tolerating the dressing changes without complication. Fortunately there is no signs of active infection at this time. No fevers, chills, nausea, vomiting, or diarrhea. I did receive the paperwork from the physical therapist at the lymphedema clinic in [place]. Subsequently I signed off on that this morning and sent that back to him for further progression with the treatment plan. 07/28/2019 on evaluation today patient appears to be doing very well with regard to his right lower extremity where I do not see any open wounds at this point. Fortunately he is feeling great as far as that is concerned as well. In regard to the left lower extremity he has been having issues with still several areas of weeping and edema although the upper leg is doing better his lower leg still I think is going require the compression wrap at this time. No fevers, chills, nausea, vomiting, or diarrhea. 08/04/2019 on evaluation today patient unfortunately is having new wounds on the right lower extremity. Again we have been using Unna boot wrap on that side. We switched him to using his juxta lite wrap at home. With that being said he tells me he has been using it although his legs extremely swollen and to be honest really does not appear that he has been. I cannot know that for sure however. Nonetheless he has multiple new wounds on the right lower extremity at this time. Obviously we will have to see about getting this rewrapped for him today. 08/11/2019 on evaluation today patient appears to be doing fairly well with regard to his wounds. He has been tolerating the dressing changes including the compression wraps without  complication. He still has a lot of edema in his upper thigh regions bilaterally he is supposed to be seeing the lymphedema clinic on the 15th of this month once his wraps arrive for the upper part of his legs. 08/18/2019 on evaluation today patient appears to be doing well with regard to his bilateral lower extremities at this point. He has been tolerating the dressing changes without complication. Fortunately there is no signs of active infection which is also good news. He does have a couple weeping areas on the first and second toe of the right foot he also has just a small area on the left foot upper leg and a small area on the left lower leg but overall he is doing quite well in my opinion. He is supposed to be getting his wraps shortly in fact tomorrow and then subsequently is seeing the lymphedema clinic next Wednesday on the 21st. Of note he is also leaving on the 25th to go on vacation for a week to the beach. For that reason and since there is some uncertainty about what there can be doing at lymphedema clinic next Wednesday I am get a make an appointment for next Friday here for [place] to see what we need to do for him prior to him leaving for  vacation. 10/23; patient arrives in considerable pain predominantly in the upper posterior calf just distal to the popliteal fossa also in the wound anteriorly above the major wound. This is probably cellulitis and he has had this recurrently in the past. He has no open wound on the right side and he has had an Radio broadcast assistant in that area. Finally I note that he has an area on the left posterior calf which by enlarge is mostly epithelialized. This protrudes beyond the borders of the surrounding skin in the setting of dry scaly skin and lymphedema. The patient is leaving for [facility] on Sunday. Per his longstanding pattern, he will not take his compression pumps with him predominantly out of fear that they will be stolen. He therefore asked that we put a  Unna boot back on the right leg. He will also contact the wound care center in [facility] to see if they can change his dressing in the mid week. 11/3; patient returned from his vacation to [facility]. He was seen on 1 occasion at their wound care center. They did a 2 layer compression system as they did not have our 4-layer wrap. I am not completely certain what they put on the wounds. They did not change the Unna boot on the right. The patient is also seeing a lymphedema specialist physical therapist in [place]. It appears that he has some compression sleeve for his thighs which indeed look quite a bit better than I am used to seeing. He pumps over these with his external compression pumps. 11/10; the patient has a new wound on the right medial thigh otherwise there is no open areas on the right. He has an area on the left leg posteriorly anteriorly and medially and an area over the left second toe. We have been using silver alginate. He thinks the injury on his thigh is secondary to friction from the compression sleeve he has. 11/17; the patient has a new wound on the right medial thigh last week. He thinks this is because he did not have a underlying stocking for his thigh juxta lite apparatus. He now has this. The area is fairly large and somewhat angry but I do not think he has underlying cellulitis. ooHe has a intact blister on the right anterior tibial area. ooSmall wound on the right great toe dorsally ooSmall area on the medial left calf. 11/30; the patient does not have any open areas on his right leg and we did not take his juxta lite stocking off. However he states that on Friday his compression wrap fell down lodging around his upper mid calf area. As usual this creates a lot of problems for him. He called urgently today to be seen for a nurse visit however the nurse visit turned into a provider visit because of extreme erythema and pain in the left anterior tibia extending  laterally and posteriorly. The area that is problematic is extensive 10/06/2019 upon evaluation today patient actually appears to be doing poorly in regard to his left lower extremity. He Dr. Leanord Hawking did place him on doxycycline this past Monday apparently due to the fact that he was doing much worse in regard to this left leg. Fortunately the doxycycline does seem to be helping. Unfortunately we are still having a very difficult time getting his edema under any type of control in order to anticipate discharge at some point. The only way were really able to control his lymphedema really is with compression wraps and that  has only even seemingly temporary. He has been seeing a lymphedema clinic they are trying to help in this regard but still this has been somewhat frustrating in general for the patient. 10/13/19 on evaluation today patient appears to be doing excellent with regard to his right lower extremity as far as the wounds are concerned. His swelling is still quite extensive unfortunately. He is still having a lot of drainage from the thigh areas bilaterally which is unfortunate. He's been going to lymphedema clinic but again he still really does not have this edema under control as far as his lower extremities are concern. With regard to his left lower extremity this seems to be improving and I do believe the doxycycline has been of benefit for him. He is about to complete the doxycycline. 10/20/2019 on evaluation today patient appears to be doing poorly in regard to his bilateral lower extremities. More in the right thigh he has a lot of irritation at this site unfortunately. In regard to the left lower extremity the wrap was not quite as high it appears and does seem to have caused him some trouble as well. Fortunately there is no evidence of systemic infection though he does have some blue-green drainage which has me concerned for the possibility of Pseudomonas. He tells me he is previously  taking Cipro without complications and he really does not care for Levaquin however due to some of the side effects he has. He is not allergic to any medications specifically antibiotics that were aware of. 10/27/2019 on evaluation today patient actually does appear to be for the most part doing better when compared to last week's evaluation. With that being said he still has multiple open wounds over the bilateral lower extremities. He actually forgot to start taking the Cipro and states that he still has the whole bottle. He does have several new blisters on left lower extremity today I think I would recommend he go ahead and take the Cipro based on what I am seeing at this point. 12/30-Patient comes at 1 week visit, 4 layer compression wraps on the left and Unna boot on the right, primary dressing Xtrasorb and silver alginate. Patient is taking his Cipro and has a few more days left probably 5-6, and the legs are doing better. He states he is using his compressions devices which I believe he has 11/10/2019 on evaluation today patient actually appears to be much better than last time I saw him 2 weeks ago. His wounds are significantly improved and overall I am very pleased in this regard. Fortunately there is no signs of active infection at this time. He is just a couple of days away from completing Cipro. Overall his edema is much better he has been using his lymphedema pumps which I think is also helping at this point. 11/17/2019 on evaluation today patient appears to be doing excellent in regard to his wounds in general. His legs are swollen but not nearly as much as they have been in the past. Fortunately he is tolerating the compression wraps without complication. No fevers, chills, nausea, vomiting, or diarrhea. He does have some erythema however in the distal portion of his right lower extremity specifically around the forefoot and toes there is a little bit of warmth here as well. 11/24/2019 on  evaluation today patient appears to be doing well with regard to his right lower extremity I really do not see any open wounds at this point. His left lower extremity does have several open  areas and his right medial thigh also is open. Other than this however overall the patient seems to be making good progress and I am very pleased at this point. 12/01/2019 on evaluation today patient appears to be doing poorly at this point in regard to his left lower extremity has several new blisters despite the fact that we have him in compression wraps. In fact he had a 4-layer compression wrap, his upper thigh wrapped from lymphedema clinic, and a juxta light over top of the 4 layer compression wrap the lymphedema clinic applied and despite all this he still develop blisters underneath. Obviously this does have me concerned about the fact that unfortunately despite what we are doing to try to get wounds healed he continues to have new areas arise I do not think he is ever good to be at the point where he can realistically just use wraps at home to keep things under control. Typically when we heal him it takes about 1-2 days before he is back in the clinic with severe breakdown and blistering of his lower extremities bilaterally. This is happened numerous times in the past. Unfortunately I think that we may need some help as far as overall fluid overload to kind of limit what we are seeing and get things under better control. 12/08/2019 on evaluation today patient presents for follow-up concerning his ongoing bilateral lower extremity edema. Unfortunately he is still having quite a bit of swelling the compression wraps are controlling this to some degree but he did see Dr. [name] his cardiologist I do have that available for review today as far as the appointment was concerned that was on 12/06/2019. Obviously that she has been 2 days ago. The patient states that he is only been taking the Lasix 80 mg 1 time a day he  had told me previously he was taking this twice a day. Nonetheless Dr. [name] recommended this be up to 80 mg 2 times a day for the patient as he did appear to be fluid overloaded. With that being said the patient states he did this yesterday and he was unable to go anywhere or do anything due to the fact that he was constantly having to urinate. Nonetheless I think that this is still good to be something that is important for him as far as trying to get his edema under control at all things that he is going to be able to just expect his wounds to get under control and things to be better without going through at least a period of time where he is trying to stabilize his fluid management in general and I think increasing the Lasix is likely the first step here. It was also mentioned the possibility that the patient may require metolazone. With that being said he wanted to have the patient take Lasix twice a day first and then reevaluating 2 months to see where things stand. 12/15/2019 upon evaluation today patient appears to be doing regard to his legs although his toes are showing some signs of weeping especially on the left at this point to some degree on the right. There does not appear to be any signs of active infection and overall I do feel like the compression wraps are doing well for him but he has not been able to take the Lasix at home and the increased dose that Dr. [name] recommended. He tells me that just not go to be feasible for him. Nonetheless I think in this case he should  probably send a message to Dr. [name] in order to discuss options from the standpoint of possible admission to get the fluid off or otherwise going forward. 12/22/2019 upon evaluation today patient appears to be doing fairly well with regard to his lower extremities at this point. In fact he would be doing excellent if it was not for the fact that his right anterior thigh apparently had an allergic reaction to adhesive  tape that he used. The wound itself that we have been monitoring actually appears to be healed. There is a lot of irritation at this point. 12/29/2019 upon evaluation today patient appears to be doing well in regard to his lower extremities. His left medial thigh is open and somewhat draining today but this is the only region that is open the right has done much better with the treatment utilizing the steroid cream that I prescribed for him last week. Overall I am pleased in that regard. Fortunately there is no signs of active infection at this time. No fevers, chills, nausea, vomiting, or diarrhea. 01/05/2020 upon evaluation today patient appears to be doing more poorly in regard to his right lower extremity at this point upon evaluation today. Unfortunately he continues to have issues in this regard and I think the biggest issue is controlling his edema. This obviously is not very well controlled at this point is been recommended that he use the Lasix twice a day but he has not been able to do that. Unfortunately I think this is leading to an issue where honestly he is not really able to effectively control his edema and therefore the wounds really are not doing significantly better. I do not think that he is going to be able to keep things under good control unless he is able to control his edema much better. I discussed this again in great detail with him today. 01/12/2020 good news is patient actually appears to be doing quite well today at this point. He does have an appointment with lymphedema clinic tomorrow. His legs appear healed and the toe on the left is almost completely healed. In general I am very pleased with how things stand at this point. 01/19/2020 upon evaluation today patient appears to actually be doing well in regard to his lower extremities there is nothing open at this point. Fortunately he has done extremely well more recently. Has been seeing lymphedema clinic as well. With that  being said he has Velcro wraps for his lower legs as well as his upper legs. The only wound really is on his toe which is the right great toe and this is barely anything even there. With all that being said I think it is good to be appropriate today to go ahead and switch him over to the Velcro compression wraps. 01/26/2020 upon evaluation today patient appears to be doing worse with regard to his lower extremities after last week switch him to Velcro compression wraps. Unfortunately he lasted less than 24 hours he did not have the sock portion of his Velcro wrap on the left leg and subsequently developed a blister underneath the Velcro portion. Obviously this is not good and not what we were looking for at this point. He states the lymphedema clinic did tell him to wear the wrap for 23 hours and take him off for 1 I am okay with that plan but again right now we got a get things back under control again he may have some cellulitis noted as well. 02/02/2020 upon evaluation  today patient unfortunately appears to have several areas of blistering on his bilateral lower extremities today mainly on the feet. His legs do seem to be doing somewhat better which is good news. Fortunately there is no evidence of active infection at this time. No fevers, chills, nausea, vomiting, or diarrhea. 02/16/2020 upon evaluation today patient appears to be doing well at this time with regard to his legs. He has a couple weeping areas on his toes but for the most part everything is doing better and does appear to be sealed up on his legs which is excellent news. We can continue with wrapping him at this point as he had every time we discontinue the wraps he just breaks out with new wounds. There is really no point in is going forward with this at this point. 03/08/2020 upon evaluation today patient actually appears to be doing quite well with regard to his lower extremity ulcers. He has just a very superficial and really almost  nonexistent blister on the left lower extremity he has in general done very well with the compression wraps. With that being said I do not see any signs of infection at this time which is good news. 03/29/2020 upon evaluation today patient appears to be doing well with regard to his wounds currently except for where he had several new areas that opened up due to some of the wrap slipping and causing him trouble. He states he did not realize they had slipped. Nonetheless he has a 1 area on the right and 3 new areas on the left. Fortunately there is no signs of active infection at this time which is great news. 04/05/2020 upon evaluation today patient actually appears to be doing quite well in general in regard to his legs currently. Fortunately there is no signs of active infection at this time. No fevers, chills, nausea, vomiting, or diarrhea. He tells me next week that he will actually be seen in the lymphedema clinic on Thursday at 10 AM I see him on Wednesday next week. 04/12/2020 upon evaluation today patient appears to be doing very well with regard to his lower extremities bilaterally. In fact he does not appear to have any open wounds at this point which is good news. Fortunately there is no signs of active infection at this time. No fevers, chills, nausea, vomiting, or diarrhea. 04/19/2020 upon evaluation today patient appears to be doing well with regard to his wounds currently on the bilateral lower extremities. There does not appear to be any signs of active infection at this time. Fortunately there is no evidence of systemic infection and overall very pleased at this point. Nonetheless after I held him out last week he literally had blisters the next morning already which swelled up with him being right back here in the clinic. Overall I think that he is just not can be able to be discharged with his legs the way they are he is much to volume overloaded as far as fluid is concerned and that was  discussed with him today of also discussed this but should try the clinic nurse manager as well as Dr. Leanord Hawking. 04/26/2020 upon evaluation today patient appears to be doing better with regard to his wounds currently. He is making some progress and overall swelling is under good control with the compression wraps. Fortunately there is no evidence of active infection at this time. 05/10/2020 on evaluation today patient appears to be doing overall well in regard to his lower extremities bilaterally. He  is Tolerating the compression wraps without complication and with what we are seeing currently I feel like that he is making excellent progress. There is no signs of active infection at this time. 05/24/2020 upon evaluation today patient appears to be doing well in regard to his legs. The swelling is actually quite a bit down compared to where it has been in the past. Fortunately there is no sign of active infection at this time which is also good news. With that being said he does have several wounds on his toes that have opened up at this point. Objective Constitutional Obese and well-hydrated in no acute distress. Vitals Time Taken: 10:31 AM, Height: 70 in, Weight: 380.2 lbs, BMI: 54.5, Temperature: 98.1 F, Pulse: 64 bpm, Respiratory Rate: 20 breaths/min, Blood Pressure: 182/67 mmHg. Respiratory normal breathing without difficulty. Psychiatric this patient is able to make decisions and demonstrates good insight into disease process. Alert and Oriented x 3. pleasant and cooperative. General Notes: Patient's wound bed currently showed signs in regard to the toes of being fairly superficial at all locations which is good news. There does not appear to be any signs of active infection also good news. In general and very pleased with where things stand today. The patient likewise is happy that things are doing better with regard to his swelling though of course he is not as pleased with the fact that his  toes are give him trouble at this point. Integumentary (Hair, Skin) Wound #174 status is Open. Original cause of wound was Blister. The wound is located on the Left,Dorsal Foot. The wound measures 1.5cm length x 2.2cm width x 0.1cm depth; 2.592cm^2 area and 0.259cm^3 volume. The wound is limited to skin breakdown. There is no tunneling or undermining noted. There is a small amount of serous drainage noted. The wound margin is indistinct and nonvisible. There is small (1-33%) pink granulation within the wound bed. There is no necrotic tissue within the wound bed. Wound #175 status is Healed - Epithelialized. Original cause of wound was Gradually Appeared. The wound is located on the Right,Posterior Lower Leg. The wound measures 0cm length x 0cm width x 0cm depth; 0cm^2 area and 0cm^3 volume. There is no tunneling or undermining noted. There is a none present amount of drainage noted. The wound margin is indistinct and nonvisible. There is no granulation within the wound bed. There is no necrotic tissue within the wound bed. Wound #176 status is Healed - Epithelialized. Original cause of wound was Gradually Appeared. The wound is located on the Right,Lateral Lower Leg. The wound measures 0cm length x 0cm width x 0cm depth; 0cm^2 area and 0cm^3 volume. There is no tunneling or undermining noted. There is a none present amount of drainage noted. The wound margin is indistinct and nonvisible. There is no granulation within the wound bed. There is no necrotic tissue within the wound bed. Wound #177 status is Open. Original cause of wound was Blister. The wound is located on the Right T Great. The wound measures 0.5cm length x 0.4cm oe width x 0.1cm depth; 0.157cm^2 area and 0.016cm^3 volume. There is Fat Layer (Subcutaneous Tissue) Exposed exposed. There is no tunneling or undermining noted. There is a small amount of serosanguineous drainage noted. The wound margin is flat and intact. There is large  (67-100%) red granulation within the wound bed. There is no necrotic tissue within the wound bed. Wound #178 status is Open. Original cause of wound was Gradually Appeared. The wound is located on the  Right T Second. The wound measures 0.4cm oe length x 0.5cm width x 0.1cm depth; 0.157cm^2 area and 0.016cm^3 volume. There is Fat Layer (Subcutaneous Tissue) Exposed exposed. There is no tunneling or undermining noted. There is a medium amount of serous drainage noted. The wound margin is flat and intact. There is large (67-100%) red granulation within the wound bed. There is no necrotic tissue within the wound bed. Wound #179 status is Open. Original cause of wound was Blister. The wound is located on the Left T Fourth. The wound measures 1cm length x 1.1cm width oe x 0.1cm depth; 0.864cm^2 area and 0.086cm^3 volume. There is Fat Layer (Subcutaneous Tissue) Exposed exposed. There is no tunneling or undermining noted. There is a medium amount of serous drainage noted. The wound margin is flat and intact. There is large (67-100%) red granulation within the wound bed. There is no necrotic tissue within the wound bed. 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 Post procedure Diagnosis Wound #: Same as Pre-Procedure There was a Four Layer Compression Therapy Procedure by Yevonne Pax, RN. Post procedure Diagnosis Wound #: Same as Pre-Procedure Plan Follow-up Appointments: Return Appointment in 1 week. Dressing  Change Frequency: Do not change entire dressing for one week. - both legs Skin Barriers/Peri-Wound Care: Moisturizing lotion - both legs Wound Cleansing: May shower with protection. Primary Wound Dressing: Wound #174 Left,Dorsal Foot: Calcium Alginate with Silver Wound #177 Right T Great: oe Calcium Alginate with Silver Wound #178 Right T Second: oe Calcium Alginate with Silver Wound #179 Left T Fourth: oe Calcium Alginate with Silver Secondary Dressing: Wound #174 Left,Dorsal Foot: Dry Gauze Wound #177 Right T Great: oe Dry Gauze Wound #178 Right T Second: oe Dry Gauze Wound #179 Left T Fourth: oe Dry Gauze Edema Control: 4 layer compression: Left lower extremity Unna Boot to Right Lower Extremity Avoid standing for long periods of time Elevate legs to the level of the heart or above for 30 minutes daily and/or when sitting, a frequency of: - throughout the day Exercise regularly 1. I am going to suggest that we go ahead and continue with the current wound care measures including the 4 layer compression wrap and Unna boot wraps currently. His legs seem to be doing well. 2. We will continue with silver alginate to the open wound locations right now this is on the foot and toe area and that we can be utilizing this. 3. I am also can suggest he needs to exercise regularly, elevate his legs, and should be trying to keep as much of the fluid down in his legs as possible that does seem to be somewhat better at this point. We will see patient back for reevaluation in 1 week here in the clinic. If anything worsens or changes patient will contact our office for additional recommendations. Electronic Signature(s) Signed: 05/24/2020 6:36:55 PM By: Lenda Kelp PA-C Entered By: Lenda Kelp on 05/24/2020 18:36:54 -------------------------------------------------------------------------------- SuperBill Details Patient Name: Date of Service: [name], Deuce J.  05/24/2020 Medical Record Number: [id number] Patient Account Number: [id number] Date of Birth/Sex: Treating RN: 1951-08-31 (69 y.o.  [name] Primary Care Provider: Marney Setting, [name] Other Clinician: Referring Provider: Treating Provider/Extender: Lenda Kelp [facility] [name], [name] in Treatment: 226 Diagnosis Coding ICD-10 Codes Code Description [phone] Non-pressure chronic ulcer of right calf limited to breakdown of skin L97.221 Non-pressure chronic ulcer of left calf limited to breakdown of skin 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 of bilateral lower extremity I89.0 Lymphedema, not elsewhere classified E11.622 Type 2 diabetes mellitus with other skin ulcer 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, unspecified L03.116 Cellulitis of left lower limb Facility Procedures CPT4 Code: [id number] Description: (Facility Use Only) [phone] - APPLY UNNA BOOT RT Modifier: Quantity: 1 CPT4 Code: [id number] Description: (Facility Use Only) [id number] - APPLY MULTLAY COMPRS LWR LT LEG Modifier: 59 Quantity: 1 Physician Procedures : CPT4 Code Description Modifier [id number] - WC PHYS LEVEL 3 - EST PT ICD-10 Diagnosis Description 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 skin L97.221 Non-pressure chronic ulcer of left calf limited to breakdown of  skin 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 of bilateral lower extremity I89.0 Lymphedema, not elsewhere classified Quantity: 1 Electronic Signature(s) Signed: 05/24/2020 6:37:27 PM By: Lenda Kelp PA-C Entered By: Lenda Kelp on 05/24/2020 18:37:12

## 2020-05-26 DIAGNOSIS — I890 Lymphedema, not elsewhere classified: Secondary | ICD-10-CM | POA: Diagnosis not present

## 2020-05-26 DIAGNOSIS — R29898 Other symptoms and signs involving the musculoskeletal system: Secondary | ICD-10-CM | POA: Diagnosis not present

## 2020-05-29 NOTE — Progress Notes (Signed)
Pt gets monthly bicillin injections as per ID recommendations due to having recurrent LE cellulitis. I agree with penicillin injections today. Signed:  Santiago Bumpers, MD           05/29/2020

## 2020-05-31 ENCOUNTER — Encounter (HOSPITAL_BASED_OUTPATIENT_CLINIC_OR_DEPARTMENT_OTHER): Payer: Medicare Other | Admitting: Physician Assistant

## 2020-05-31 DIAGNOSIS — L97221 Non-pressure chronic ulcer of left calf limited to breakdown of skin: Secondary | ICD-10-CM | POA: Diagnosis not present

## 2020-05-31 DIAGNOSIS — L03116 Cellulitis of left lower limb: Secondary | ICD-10-CM | POA: Diagnosis not present

## 2020-05-31 DIAGNOSIS — I482 Chronic atrial fibrillation, unspecified: Secondary | ICD-10-CM | POA: Diagnosis not present

## 2020-05-31 DIAGNOSIS — Z794 Long term (current) use of insulin: Secondary | ICD-10-CM | POA: Diagnosis not present

## 2020-05-31 DIAGNOSIS — E1151 Type 2 diabetes mellitus with diabetic peripheral angiopathy without gangrene: Secondary | ICD-10-CM | POA: Diagnosis not present

## 2020-05-31 DIAGNOSIS — I110 Hypertensive heart disease with heart failure: Secondary | ICD-10-CM | POA: Diagnosis not present

## 2020-05-31 DIAGNOSIS — I872 Venous insufficiency (chronic) (peripheral): Secondary | ICD-10-CM | POA: Diagnosis not present

## 2020-05-31 DIAGNOSIS — I890 Lymphedema, not elsewhere classified: Secondary | ICD-10-CM | POA: Diagnosis not present

## 2020-05-31 DIAGNOSIS — E11622 Type 2 diabetes mellitus with other skin ulcer: Secondary | ICD-10-CM | POA: Diagnosis not present

## 2020-05-31 DIAGNOSIS — I272 Pulmonary hypertension, unspecified: Secondary | ICD-10-CM | POA: Diagnosis not present

## 2020-05-31 DIAGNOSIS — L97211 Non-pressure chronic ulcer of right calf limited to breakdown of skin: Secondary | ICD-10-CM | POA: Diagnosis not present

## 2020-05-31 DIAGNOSIS — I87333 Chronic venous hypertension (idiopathic) with ulcer and inflammation of bilateral lower extremity: Secondary | ICD-10-CM | POA: Diagnosis not present

## 2020-05-31 DIAGNOSIS — I509 Heart failure, unspecified: Secondary | ICD-10-CM | POA: Diagnosis not present

## 2020-05-31 DIAGNOSIS — E1140 Type 2 diabetes mellitus with diabetic neuropathy, unspecified: Secondary | ICD-10-CM | POA: Diagnosis not present
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
## 2020-06-01 NOTE — Progress Notes (Addendum)
CLELL, TRAHAN ([id number]) Visit Report for 05/31/2020 Chief Complaint Document Details Patient Name: Date of Service: [name] 05/31/2020 1:15 PM Medical Record Number: [id number] Patient Account Number: [id number] Date of Birth/Sex: Treating RN: 1950-11-07 (69 y.o. [name] Primary Care Provider: Marney Setting, [name] Other Clinician: Referring Provider: Treating Provider/Extender: Lenda Kelp [facility] [name], [name] in Treatment: 227 Information Obtained from: Patient Chief Complaint patient is here for evaluation venous/lymphedema weeping Electronic Signature(s) Signed: 05/31/2020 1:22:25 PM By: Lenda Kelp PA-C Entered By: Lenda Kelp on 05/31/2020 13:22:25 -------------------------------------------------------------------------------- HPI Details Patient Name: Date of Service: [name]. 05/31/2020 1:15 PM Medical Record Number: [id number] Patient Account Number: [id number] Date of Birth/Sex: Treating RN: 05/17/1951 (69 y.o. [name] Primary Care Provider: Marney Setting, [name] Other Clinician: Referring Provider: Treating Provider/Extender: Lenda Kelp [facility] [name], [name] in Treatment: 227 History of Present Illness HPI Description: Referred by PCP for consultation. Patient has long standing history of BLE venous stasis, no prior ulcerations. At beginning of month, developed cellulitis and weeping. Received IM Rocephin followed by Keflex and resolved. Wears compression stocking, appr 6 months old. Not sure strength. No present drainage. 01/22/16 this is a patient who is a type II diabetic on insulin. 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 He tells me he religiously wears pressure stockings of uncertain strength. He was here with weeping edema about 8 months ago but did not have an open wound. Roughly a month ago he had a reopening on his bilateral legs. He is been using bandages and Neosporin. He does  not complain of pain. 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 Last BUN/creatinine I have is from 11/20/15 at 13 and 1.0 respectively 01/29/16; patient arrives today having tolerated the Profore wrap. He brought in his stockings and these are 18 mmHg stockings he bought from [place]. The compression here is likely inadequate. He does not complain of pain or excessive drainage she has no systemic symptoms. The wound on the right looks improved as does the one on the left although one on the left is more substantial with still tissue at risk below the actual wound area on the bilateral posterior calf 02/05/16; patient arrives with poor edema control. He states that we did put a 4 layer compression on it last week. No weight appear 5 this. 02/12/16; the area on the posterior right Has healed. The left Has a substantial wound that has necrotic surface eschar that requires a debridement with a curette. 02/16/16;the patient called or a Nurse visit secondary to increased swelling. He had been in earlier in the week with his right leg healed. He was transitioned to is on pressure stocking on the right leg with the only open wound on the left, a substantial area on the left posterior calf. Note he has a history of severe lower extremity edema, he has a history of chronic atrial fibrillation but not heart failure per my notes but I'll need to research this. He is not complaining of chest pain shortness of breath or orthopnea. The intake nurse noted blisters on the previously closed right leg 02/19/16; this is the patient's regular visit day. I see him on Friday with escalating edema new wounds on the right leg and clear signs of at least right ventricular heart failure. I increased his Lasix to 40 twice  a day. He is returning currently in follow-up. States he is noticed a decrease in that the edema 02/26/16  patient's legs have much less edema. There is nothing really open on the right leg. The left leg has improved condition of the large superficial wound on the posterior left leg 03/04/16; edema control is very much better. The patient's right leg wounds have healed. On the left leg he continues to have severe venous inflammation on the posterior aspect of the left leg. There is no tenderness and I don't think any of this is cellulitis. 03/11/16; patient's right leg is married healed and he is in his own stocking. The patient's left leg has deteriorated somewhat. There is a lot of erythema around the wound on the posterior left leg. There is also a significant rim of erythema posteriorly just above where the wrap would've ended there is a new wound in this location and a lot of tenderness. Can't rule out cellulitis in this area. 03/15/16; patient's right leg remains healed and he is in his own stocking. The patient's left leg is much better than last review. His major wound on the posterior aspect of his left Is almost fully epithelialized. He has 3 small injuries from the wraps. Really. Erythema seems a lot better on antibiotics 03/18/16; right leg remains healed and he is in his own stocking. The patient's left leg is much better. The area on the posterior aspect of the left calf is fully epithelialized. His 3 small injuries which were wrap injuries on the left are improved only one seems still open his erythema has resolved 03/25/16; patient's right leg remains healed and he is in his own stocking. There is no open area today on the left leg posterior leg is completely closed up. His wrap injuries at the superior aspect of his leg are also resolved. He looks as though he has some irritation on the dorsal ankle but this is fully epithelialized without evidence of infection. 03/28/16; we discharged this patient on Monday. Transitioned him into his own stocking. There were problems almost immediately with  uncontrolled swelling weeping edema multiple some of which have opened. He does not feel systemically unwell in particular no chest pain no shortness of breath and he does not feel 04/08/16; the edema is under better control with the Profore light wrap but he still has pitting edema. There is one large wound anteriorly 2 on the medial aspect of his left leg and 3 small areas on the superior posterior calf. Drainage is not excessive he is tolerating a Profore light well 04/15/16; put a Profore wrap on him last week. This is controlled is edema however he had a lot of pain on his left anterior foot most of his wounds are healed 04/22/16 once again the patient has denuded areas on the left anterior foot which he states are because his wrap slips up word. He saw his primary physician today is on Lasix 40 twice a day and states that he his weight is down 20 pounds over the last 3 months. 04/29/16: Much improved. left anterior foot much improved. He is now on Lasix 80 mg per day. Much improved edema control 05/06/16; I was hoping to be able to discharge him today however once again he has blisters at a low level of where the compression was placed last week mostly on his left lateral but also his left medial leg and a small area on the anterior part of the left foot. 05/09/16; apparently the  patient went home after his appointment on 7/4 later in the evening developing pain in his upper medial thigh together with subjective fever and chills although his temperature was not taken. The pain was so intense he felt he would probably have to call 911. However he then remembered that he had leftover doxycycline from a previous round of antibiotics and took these. By the next morning he felt a lot better. He called and spoke to one of our nurses and I approved doxycycline over the phone thinking that this was in relation to the wounds we had previously seen although they were definitely were not. The patient feels a lot  better old fever no chills he is still working. Blood sugars are reasonably controlled 05/13/16; patient is back in for review of his cellulitis on his anterior medial upper thigh. He is taking doxycycline this is a lot better. Culture I did of the nodular area on the dorsal aspect of his foot grew MRSA this also looks a lot better. 05/20/16; the patient is cellulitis on the medial upper thigh has resolved. All of his wound areas including the left anterior foot, areas on the medial aspect of the left calf and the lateral aspect of the calf at all resolved. He has a new blister on the left dorsal foot at the level of the fourth toe this was excised. No evidence of infection 05/27/16; patient continues to complain weeping edema. He has new blisterlike wounds on the left anterior lateral and posterior lateral calf at the top of his wrap levels. The area on his left anterior foot appears better. He is not complaining of fever, pain or pruritus in his feet. 05/30/16; the patient's blisters on his left anterior leg posterior calf all look improved. He did not increase the Lasix 100 mg as I suggested because he was going to run out of his 40 mg tablets. He is still having weeping edema of his toes 06/03/16; I renewed his Lasix at 80 mg once a day as he was about to run out when I last saw him. He is on 80 mg of Lasix now. I have asked him to cut down on the excessive amount of water he was drinking and asked him to drink according to his thirst mechanisms 06/12/2016 -- was seen 2 days ago and was supposed to wear his compression stockings at home but he is developed lymphedema and superficial blisters on the left lower extremity and hence came in for a review 06/24/16; the remaining wound is on his left anterior leg. He still has edema coming from between his toes. There is lymphedema here however his edema is generally better than when I last saw this. He has a history of atrial fibrillation but does not have a  known history of congestive heart failure nevertheless I think he probably has this at least on a diastolic basis. 07/01/16 I reviewed his echocardiogram from January 2017. This was essentially normal. He did not have LVH, EF of 55-60%. His right ventricular function was normal although he did have trivial tricuspid and pulmonic regurgitation. This is not audible on exam however. I increased his Lasix to do massive edema in his legs well above his knees I think in early July. He was also drinking an excessive amount of water at the time. 07/15/16; missed his appointment last week because of the Labor Day holiday on Monday. He could not get another appointment later in the week. Started to feel the wrap digging in  superiorly so we remove the top half and the bottom half of his wrap. He has extensive erythema and blistering superiorly in the left leg. Very tender. Very swollen. Edema in his foot with leaking edema fluid. He has not been systemically unwell 07/22/16; the area on the left leg laterally required some debridement. The medial wounds look more stable. His wrap injury wounds appear to have healed. Edema and his foot is better, weeping edema is also better. He tells me he is meeting with the supplier of the external compression pumps at work 08/05/16; the patient was on vacation last week in [name]. His wrap is been on for an extended period of time. Also over the weekend he developed an extensive area of tender erythema across his anterior medial thigh. He took to doxycycline yesterday that he had leftover from a previous prescription. The patient complains of weeping edema coming out of his toes 08/08/16; I saw this patient on 10/2. He was tender across his anterior thigh. I put him on doxycycline. He returns today in follow-up. He does not have any open wounds on his lower leg, he still has edema weeping into his toes. 08/12/16; patient was seen back urgently today to follow-up for his  extensive left thigh cellulitis/erysipelas. He comes back with a lot less swelling and erythema pain is much better. I believe I gave him Augmentin and Cipro. His wrap was cut down as he stated a roll down his legs. He developed blistering above the level of the wrap that remained. He has 2 open blisters and 1 intact. 08/19/16; patient is been doing his primary doctor who is increased his Lasix from 40-80 once a day or 80 already has less edema. Cellulitis has remained improved in the left thigh. 2 open areas on the posterior left calf 08/26/16; he returns today having new open blisters on the anterior part of his left leg. He has his compression pumps but is not yet been shown how to use some vital representative from the supplier. 09/02/16 patient returns today with no open wounds on the left leg. Some maceration in his plantar toes 09/10/2016 -- Dr. Leanord Hawking had recently discharged him on 09/02/2016 and he has come right back with redness swelling and some open ulcers on his left lower extremity. He says this was caused by trying to apply his compression stockings and he's been unable to use this and has not been able to use his lymphedema pumps. He had some doxycycline leftover and he has started on this a few days ago. 09/16/16; there are no open wounds on his leg on the left and no evidence of cellulitis. 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 He does not complain of symptoms here. I am not clear use using his external compression pumps. 09/23/16; I have not seen this patient in 2 weeks. He canceled his appointment 10 days ago as he was going on vacation. He tells me that on Monday he noticed a large area on his posterior left leg which is been draining copiously and is reopened into a large wound. He is been using ABDs and the external part of his juxtalite, according to our nurse this was not on properly. 10/07/16; Still a substantial  area on the posterior left leg. Using silver alginate 10/14/16; in general better although there is still open area which looks healthy. Still using silver alginate. He reminds me that this happen before he left for  [place]. T oday while he was showering in the morning. He had been using his juxtalite's 10/21/16; the area on his posterior left leg is fully epithelialized. However he arrives today with a large area of tender erythema in his medial and posterior left thigh just above the knee. I have marked the area. Once again he is reluctant to consider hospitalization. I treated him with oral antibiotics in the past for a similar situation with resolution I think with doxycycline however this area it seems more extensive to me. He is not complaining of fever but does have chills and says states he is thirsty. His blood sugar today was in the 140s at home 10/25/16 the area on his posterior left leg is fully epithelialized although there is still some weeping edema. The large area of tenderness and erythema in his medial and posterior left thigh is a lot less tender although there is still a lot of swelling in this thigh. He states he feels a lot better. He is on doxycycline and Augmentin that I started last week. This will continued until Tuesday, December 26. I have ordered a duplex ultrasound of the left thigh rule out DVT whether there is an abscess something that would need to be drained I would also like to know. 11/01/16; he still has weeping edema from a not fully epithelialized area on his left posterior calf. Most of the rest of this looks a lot better. He has completed his antibiotics. His thigh is a lot better. Duplex ultrasound did not show a DVT in the thigh 11/08/16; he comes in today with more Denuded surface epithelium from the posterior aspect of his calf. There is no real evidence of cellulitis. The superior aspect of his wrap appears to have put quite an indentation in his leg  just below the knee and this may have contributed. He does not complain of pain or fever. We have been using silver alginate as the primary dressing. The area of cellulitis in the right thigh has totally resolved. He has been using his compression stockings once a week 11/15/16; the patient arrives today with more loss of epithelium from the posterior aspect of his left calf. He now has a fairly substantial wound in this area. The reason behind this deterioration isn't exactly clear although his edema is not well controlled. He states he feels he is generally more swollen systemically. He is not complaining of chest pain shortness of breath fever. T me he has an appointment with his primary physician in early February. He is on 80 mg of oral ells Lasix a day. He claims compliance with the external compression pumps. He is not having any pain in his legs similar to what he has with his recurrent cellulitis 11/22/16; the patient arrives a follow-up of his large area on his left lateral calf. This looks somewhat better today. He came in earlier in the week for a dressing change since I saw him a week ago. He is not complaining of any pain no shortness of breath no chest pain 11/28/16; the patient arrives for follow-up of his large area on the left lateral calf this does not look better. In fact it is larger weeping edema. The surface of the wound does not look too bad. We have been using silver alginate although I'm not certain that this is a dressing issue. 12/05/16; again the patient follows up for a large wound on the left lateral and left posterior calf this does not look better.  There continues to be weeping edema necrotic surface tissue. More worrisome than this once again there is erythema below the wound involving the distal Achilles and heel suggestive of cellulitis. He is on his feet working most of the day of this is not going well. We are changing his dressing twice a week to facilitate the  drainage. 12/12/16; not much change in the overall dimensions of the large area on the left posterior calf. This is very inflamed looking. I gave him an. Doxycycline last week does not really seem to have helped. He found the wrap very painful indeed it seems to of dog into his legs superiorly and perhaps around the heel. He came in early today because the drainage had soaked through his dressings. 12/19/16- patient arrives for follow-up evaluation of his left lower extremity ulcers. He states that he is using his lymphedema pumps once daily when there is "no drainage". He admits to not using his lipedema pumps while under current treatment. His blood sugars have been consistently between 150-200. 12/26/16; the patient is not using his compression pumps at home because of the wetness on his feet. I've advised him that I think it's important for him to use this daily. He finds his feet too wet, he can put a plastic bag over his legs while he is in the pumps. Otherwise I think will be in a vicious circle. We are using silver alginate to the major area on his left posterior calf 01/02/17; the patient's posterior left leg has further of all into 3 open wounds. All of them covered with a necrotic surface. He claims to be using his compression pumps once a day. His edema control is marginal. Continue with silver alginate 01/10/17; the patient's left posterior leg actually looks somewhat better. There is less edema, less erythema. Still has 3 open areas covered with a necrotic surface requiring debridement. He claims to be using his compression pumps once a day his edema control is better 01/17/17; the patient's left posterior calf look better last week when I saw him and his wrap was changed 2 days ago. He has noted increasing pain in the left heel and arrives today with much larger wounds extensive erythema extending down into the entire heel area especially tender medially. He is not systemically unwell CBGs have  been controlled no fever. Our intake nurse showed me limegreen drainage on his AVD pads. 01/24/17; his usual this patient responds nicely to antibiotics last week giving him Levaquin for presumed Pseudomonas. The whole entire posterior part of his leg is much better much less inflamed and in the case of his Achilles heel area much less tender. He has also had some epithelialization posteriorly there are still open areas here and still draining but overall considerably better 01/31/17- He has continue to tolerate the compression wraps. he states that he continues to use the lymphedema pumps daily, and can increase to twice daily on the weekends. He is voicing no complaints or concerns regarding his LLE ulcers 02/07/17-he is here for follow-up evaluation. He states that he noted some erythema to the left medial and anterior thigh, which he states is new as of yesterday. He is concerned about recurrent cellulitis. He states his blood sugars have been slightly elevated, this morning in the 180s 02/14/17; he is here for follow-up evaluation. When he was last here there was erythema superiorly from his posterior wound in his anterior thigh. He was prescribed Levaquin however a culture of the wound surface grew MRSA  over the phone I changed him to doxycycline on Monday and things seem to be a lot better. 02/24/17; patient missed his appointment on Friday therefore we changed his nurse visit into a physician visit today. Still using silver alginate on the large area of the posterior left thigh. He isn't new area on the dorsal left second toe 03/03/17; actually better today although he admits he has not used his external compression pumps in the last 2 days or so because of work responsibilities over the weekend. 03/10/17; continued improvement. External compression pumps once a day almost all of his wounds have closed on the posterior left calf. Better edema control 03/17/17; in general improved. He still has 3 small  open areas on the lateral aspect of his left leg however most of the area on the posterior part of his leg is epithelialized. He has better edema control. He has an ABD pad under his stocking on the right anterior lower leg although he did not let us look at that today. 03/24/17; patient arrives back in clinic today with no open areas however there are areas on the posterior left calf and anterior left calf that are less than 100% epithelialized. His edema is well controlled in the left lower leg. There is some pitting edema probably lymphedema in the left upper thigh. He uses compression pumps at home once per day. I tried to get him to do this twice a day although he is very reticent. 04/01/2017 -- for the last 2 days he's had significant redness, tenderness and weeping and came in for an urgent visit today. 04/07/17; patient still has 6 more days of doxycycline. He was seen by Dr. Meyer Russel last Wednesday for cellulitis involving the posterior aspect, lateral aspect of his Involving his heel. For the most part he is better there is less erythema and less weeping. He has been on his feet for 12 hours 2 over the weekend. Using his compression pumps once a day 04/14/17 arrives today with continued improvement. Only one area on the posterior left calf that is not fully epithelialized. He has intense bilateral venous inflammation associated with his chronic venous insufficiency disease and secondary lymphedema. We have been using silver alginate to the left posterior calf wound In passing he tells [place] today that the right leg but we have not seen in quite some time has an open area on it but he doesn't want [place] to look at this today states he will show this to [place] next week. 04/21/17; there is no open area on his left leg although he still reports some weeping edema. He showed [place] his right leg today which is the first time we've seen this leg in a long time. He has a large area of open wound on the right leg  anteriorly healthy granulation. Quite a bit of swelling in the right leg and some degree of venous inflammation. He told us about the right leg in passing last week but states that deterioration in the right leg really only happened over the weekend 04/28/17; there is no open area on the left leg although there is an irritated part on the posterior which is like a wrap injury. The wound on the right leg which was new from last week at least to [place] is a lot better. 05/05/17; still no open area on the left leg. Patient is using his new compression stocking which seems to be doing a good job of controlling the edema. He states he is using  his compression pumps once per day. The right leg still has an open wound although it is better in terms of surface area. Required debridement. A lot of pain in the posterior right Achilles marked tenderness. Usually this type of presentation this patient gives concern for an active cellulitis 05/12/17; patient arrives today with his major wound from last week on the right lateral leg somewhat better. Still requiring debridement. He was using his compression stocking on the left leg however that is reopened with superficial wounds anteriorly he did not have an open wound on this leg previously. He is still using his juxta light's once daily at night. He cannot find the time to do this in the morning as he has to be at work by 7 AM 05/19/17; right lateral leg wound looks improved. No debridement required. The concerning area is on the left posterior leg which appears to almost have a subcutaneous hemorrhagic component to it. We've been using silver alginate to all the wounds 05/26/17; the right lateral leg wound continues to look improved. However the area on the left posterior calf is a tightly adherent surface. [place] using silver alginate. Because of the weeping edema in his legs there is very little good alternatives. 06/02/17; the patient left here last week looking quite  good. Major wound on the left posterior calf and a small one on the right lateral calf. Both of these look satisfactory. He tells me that by Wednesday he had noted increased pain in the left leg and drainage. He called on Thursday and Friday to get an appointment here but we were blocked. He did not go to urgent care or his primary physician. He thinks he had a fever on Thursday but did not actually take his temperature. He has not been using his compression pumps on the left leg because of pain. I advised him to go to the emergency room today for IV antibiotics for stents of left leg cellulitis but he has refused I have asked him to take 2 days off work to keep his leg elevated and he has refused this as well. In view of this I'm going to call him and Augmentin and doxycycline. 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 However after a detailed discussion regarding the risk benefits and alternatives he refuses to get admitted to the hospital. With no other recourse I will continue him on oral antibiotics as before and hopefully he'll have his infectious disease consultation this week. 06/16/2017 -- the patient was seen today by the nurse practitioner at infectious disease Ms. Dixon. 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 He has also been advise Lotrimin cream locally. she also had wise IV antibiotics for his condition if it flares up 06/23/17; patient arrives today with drainage bilaterally although the remaining wound on the left posterior calf after cleaning up today "highlighter  yellow drainage" did not look too bad. Unfortunately he has had breakdown on the right anterior leg [previously this leg had not been open and he is using a black stocking] he went to see  infectious disease and is been put on clindamycin 150 mg daily, I did not verify the dose although I'm not familiar with using clindamycin in this dosing range, perhaps for prophylaxisoo 06/27/17; I brought this patient back today to follow-up on the wound deterioration on the right lower leg together with surrounding cellulitis. I started him on doxycycline 4 days ago. This area looks better however he comes in today with intense cellulitis on the medial part of his left thigh. This is not have a wound in this area. Extremely tender. We've been using silver alginate to the wounds on the right lower leg left lower leg with bilateral 4 layer compression he is using his external compression pumps once a day 07/04/17; patient's left medial thigh cellulitis looks better. He has not been using his compression pumps as his insert said it was contraindicated with cellulitis. His right leg continues to make improvements all the wounds are still open. We only have one remaining wound on the left posterior calf. Using silver alginate to all open areas. He is on doxycycline which I started a week ago and should be finishing I gave him Augmentin after Thursday's visit for the severe cellulitis on the left medial thigh which fortunately looks better 07/14/17; the patient's left medial thigh cellulitis has resolved. The cellulitis in his right lower calf on the right also looks better. All of his wounds are stable to improved we've been using silver alginate he has completed the antibiotics I have given him. He has clindamycin 150 mg once a day prescribed by infectious disease for prophylaxis, I've advised him to start this now. We have been using bilateral Unna boots over silver alginate to the wound areas 07/21/17; the patient is  been to see infectious disease who noted his recurrent problems with cellulitis. He was not able to tolerate prophylactic clindamycin therefore he is on amoxicillin 500 twice a day. He also had a second daily dose of Lasix added By Dr. Oneta Rack but he is not taking this. Nor is he being completely compliant with his compression pumps a especially not this week. He has 2 remaining wounds one on the right posterior lateral lower leg and one on the left posterior medial lower leg. 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 The patient has Unna boots bilaterally. 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 He has bilateral Unna boots for compression and silver alginate to his wounds. Arrives today with his legs looking as good as I have seen him in quite some time. Not surprisingly his wounds look better as well with improvement on the right lateral leg venous insufficiency wound and also the left medial leg. He is still using the compression pumps once a day 08/11/17; both legs appear to be doing better wounds on the right lateral and left medial legs look better. Skin on the right leg quite good. He is been using silver alginate as the primary dressing. I'm going to use Anasept gel calcium alginate and maintain all the secondary dressings 08/18/17; the patient continues to actually do quite well. The area on his right lateral leg is just about closed the left medial also looks better although it is still moist in this area. His edema is well controlled we have been using Anasept gel with calcium  alginate and the usual secondary dressings, 4 layer compression and once daily use of his compression pumps "always been able to manage 09/01/17; the patient continues to do reasonably well in  spite of his trip to [place]. The area on the right lateral leg is epithelialized. Left is much better but still open. He has more edema and more chronic erythema on the left leg [venous inflammation] 09/08/17; he arrives today with no open wound on the right lateral leg and decently controlled edema. Unfortunately his left leg is not nearly as in his good situation as last week.he apparently had increasing edema starting on Saturday. He edema soaked through into his foot so used a plastic bag to walk around his home. The area on the medial right leg which was his open area is about the same however he has lost surface epithelium on the left lateral which is new and he has significant pain in the Achilles area of the left foot. He is already on amoxicillin chronically for prophylaxis of cellulitis in the left leg 09/15/17; he is completed a week of doxycycline and the cellulitis in the left posterior leg and Achilles area is as usual improved. He still has a lot of edema and fluid soaking through his dressings. There is no open wound on the right leg. He saw infectious disease NP today 09/22/17;As usual 1 we transition him from our compression wraps to his stockings things did not go well. He has several small open areas on the right leg. He states this was caused by the compression wrap on his skin although he did not wear this with the stockings over them. He has several superficial areas on the left leg medially laterally posteriorly. He does not have any evidence of active cellulitis especially involving the left Achilles The patient is traveling from [facility] Saturday going to [facility]. He states he isn't attempting to get an appointment with a heel objects wound center there to change his dressings. I am not completely certain whether this will work 10/06/17; the patient came in on Friday for a nurse visit and the nurse reported that his legs actually look quite good. He arrives in clinic  today for his regular follow-up visit. He has a new wound on his left third toe over the PIP probably caused by friction with his footwear. He has small areas on the left leg and a very superficial but epithelialized area on the right anterior lateral lower leg. Other than that his legs look as good as I've seen him in quite some time. We have been using silver alginate Review of systems; no chest pain no shortness of breath other than this a 10 point review of systems negative 10/20/17; seen by Dr. Meyer Russel last week. He had taken some antibiotics [doxycycline] that he had left over. Dr. Meyer Russel thought he had candida infection and declined to give him further antibiotics. He has a small wound remaining on the right lateral leg several areas on the left leg including a larger area on the left posterior several left medial and anterior and a small wound on the left lateral. The area on the left dorsal third toe looks a lot better. ROS; Gen.; no fever, respiratory no cough no sputum Cardiac no chest pain other than this 10 point review of system is negative 10/30/17; patient arrives today having fallen in the bathtub 3 days ago. It took him a while to get up. He has pain and maceration in the wounds on  his left leg which have deteriorated. He has not been using his pumps he also has some maceration on the right lateral leg. 11/03/17; patient continues to have weeping edema especially in the left leg. This saturates his dressings which were just put on on 12/27. As usual the doxycycline seems to take care of the cellulitis on his lower leg. He is not complaining of fever, chills, or other systemic symptoms. He states his leg feels a lot better on the doxycycline I gave him empirically. He also apparently gets injections at his primary doctor's officeo Rocephin for cellulitis prophylaxis. I didn't ask him about his compression pump compliance today I think that's probably marginal. Arrives in the clinic  with all of his dressings primary and secondary macerated full of fluid and he has bilateral edema 11/10/17; the patient's right leg looks some better although there is still a cluster of wounds on the right lateral. The left leg is inflamed with almost circumferential skin loss medially to laterally although we are still maintaining anteriorly. He does not have overt cellulitis there is a lot of drainage. He is not using compression pumps. We have been using silver alginate to the wound areas, there are not a lot of options here 11/17/17; the patient's right leg continues to be stable although there is still open wounds, better than last week. The inflammation in the left leg is better. Still loss of surface layer epithelium especially posteriorly. There is no overt cellulitis in the amount of edema and his left leg is really quite good, tells me he is using his compression pumps once a day. 11/24/17; patient's right leg has a small superficial wound laterally this continues to improve. The inflammation in the left leg is still improving however we have continuous surface layer epithelial loss posteriorly. There is no overt cellulitis in the amount of edema in both legs is really quite good. He states he is using his compression pumps on the left leg once a day for 5 out of 7 days 12/01/17; very small superficial areas on the right lateral leg continue to improve. Edema control in both legs is better today. He has continued loss of surface epithelialization and left posterior calf although I think this is better. We have been using silver alginate with large number of absorptive secondary dressings 4 layer on the left Unna boot on the right at his request. He tells me he is using his compression pumps once a day 12/08/17; he has no open area on the right leg is edema control is good here. On the left leg however he has marked erythema and tenderness breakdown of skin. He has what appears to be a wrap injury  just distal to the popliteal fossa. 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 There was swelling erythema and breakdown in the right leg. He has much worse skin breakdown in the left leg as well multiple open areas medially and posteriorly as well as laterally. He tells me he has been using his compression pumps but tells me he feels that the drainage out of his leg is worse when he uses a compression pumps. T be fair to him he is been saying this o for a while however  I don't know that I have really been listening to this. I wonder if the compression pumps are working properly 12/22/17;. Once again he arrives with severe erythema, weeping edema from the left greater than right leg. Noncompliance with compression pumps. New this visit he is complaining of pain on the lateral aspect of the right leg and the medial aspect of his right thigh. He apparently saw his cardiologist Dr. [name] who was ordered an echocardiogram area and I think this is a step in the right direction 12/25/17; started his doxycycline Monday night. There is still intense erythema of the right leg especially in the anterior thigh although there is less tenderness. The erythema around the wound on the right lateral calf also is less tender. He still complaining of pain in the left heel. His wounds are about the same right lateral left medial left lateral. Superficial but certainly not close to closure. He denies being systemically unwell no fever chills no abdominal pain no diarrhea 12/29/17; back in follow-up of his extensive right calf and right thigh cellulitis. I added amoxicillin to cover possible doxycycline resistant strep. This seems to of done the  trick he is in much less pain there is much less erythema and swelling. He has his echocardiogram at 11:00 this morning. X-ray of the left heel was also negative. 01/05/18; the patient arrived with his edema under much better control. Now that he is retired he is able to use his compression pumps daily and sometimes twice a day per the patient. He has a wound on the right leg the lateral wound looks better. Area on the left leg also looks a lot better. He has no evidence of cellulitis in his bilateral thighs I had a quick peak at his echocardiogram. He is in normal ejection fraction and normal left ventricular function. He has moderate pulmonary hypertension moderately reduced right ventricular function. One would have to wonder about chronic sleep apnea although he says he doesn't snore. He'll review the echocardiogram with his cardiologist. 01/12/18; the patient arrives with the edema in both legs under exemplary control. He is using his compression pumps daily and sometimes twice daily. His wound on the right lateral leg is just about closed. He still has some weeping areas on the posterior left calf and lateral left calf although everything is just about closed here as well. I have spoken with Aldean Baker who is the patient's nurse practitioner and infectious disease. She was concerned that the patient had not understood that the parenteral penicillin injections he was receiving for cellulitis prophylaxis was actually benefiting him. I don't think the patient actually saw that I would tend to agree we were certainly dealing with less infections although he had a serious one last month. 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 He is healed. He'll be placed in juxtalite compression wraps and increase his lymphedema pumps to twice daily. We will follow up again next week to ensure there are no issues with the new regiment. 01/20/18-he is here for evaluation of  bilateral lower extremity weeping edema. Yesterday he was placed in compression wrap to the right lower extremity and compression stocking to left lower shrubbery. He states he uses lymphedema pumps last night and again this morning and noted a blister to the left lower extremity. On exam he was noted to have drainage to the right lower extremity. He will be placed in Unna boots bilaterally and follow-up next week 01/26/18; patient was actually discharged  a week ago to his own juxta light stockings only to return the next day with bilateral lower extremity weeping edema.he was placed in bilateral Unna boots. He arrives today with pain in the back of his left leg. There is no open area on the right leg however there is a linear/wrap injury on the left leg and weeping edema on the left leg posteriorly. I spoke with infectious disease about 10 days ago. They were disappointed that the patient elected to discontinue prophylactic intramuscular penicillin shots as they felt it was particularly beneficial in reducing the frequency of his cellulitis. I discussed this with the patient today. He does not share this view. He'll definitely need antibiotics today. Finally he is traveling to [place] and trauma leaving this Saturday and returning a week later and he does not travel with his pumps. He is going by car 01/30/18; patient was seen 4 days ago and brought back in today for review of cellulitis in the left leg posteriorly. I put him on amoxicillin this really hasn't helped as much as I might like. He is also worried because he is traveling to [facility] trauma by car. Finally we will be rewrapping him. There is no open area on the right leg over his left leg has multiple weeping areas as usual 02/09/18; The same wrap on for 10 days. He did not pick up the last doxycycline I prescribed for him. He apparently took 4 days worth he already had. There is nothing open on his right leg and the edema control is really  quite good. He's had damage in the left leg medially and laterally especially probably related to the prolonged use of Unna boots 02/12/18; the patient arrived in clinic today for a nurse visit/wrap change. He complained of a lot of pain in the left posterior calf. He is taking doxycycline that I previously prescribed for him. Unfortunately even though he used his stockings and apparently used to compression pumps twice a day he has weeping edema coming out of the lateral part of his right leg. This is coming from the lower anterior lateral skin area. 02/16/18; the patient has finished his doxycycline and will finish the amoxicillin 2 days. The area of cellulitis in the left calf posteriorly has resolved. He is no longer having any pain. He tells me he is using his compression pumps at least once a day sometimes twice. 02/23/18; the patient finished his doxycycline and Amoxil last week. On Friday he noticed a small erythematous circle about the size of a quarter on the left lower leg just above his ankle. This rapidly expanded and he now has erythema on the lateral and posterior part of the thigh. This is bright red. Also has an area on the dorsal foot just above his toes and a tender area just below the left popliteal fossa. He came off his prophylactic penicillin injections at his own insistence one or 2 months ago. This is obviously deteriorated since then 03/02/18; patient is on doxycycline and Amoxil. Culture I did last week of the weeping area on the back of his left calf grew group B strep. I have therefore renewed the amoxicillin 500 3 times a day for a further week. He has not been systemically unwell. Still complaining of an area of discomfort right under his left popliteal fossa. There is no open wound on the right leg. He tells me that he is using his pumps twice a day on most days 03/09/18; patient arrives in clinic today  completing his amoxicillin today. The cellulitis on his left leg is  better. Furthermore he tells me that he had intramuscular penicillin shots that his primary care office today. However he also states that the wrap on his right leg fell down shortly after leaving clinic last week. He developed a large blister that was present when he came in for a nurse visit later in the week and then he developed intense discomfort around this area.He tells me he is using his compression pumps 03/16/18; the patient has completed his doxycycline. The infectious part of this/cellulitis in the left heel area left popliteal area is a lot better. He has 2 open areas on the right calf. Still areas on the left calf but this is a lot better as well. 03/24/18; the patient arrives complaining of pain in the left popliteal area again. He thinks some of this is wrap injury. He has no open area on the right leg and really no open area on the left calf either except for the popliteal area. He claims to be compliant with the compression pumps 03/31/18; I gave him doxycycline last week because of cellulitis in the left popliteal area. This is a lot better although the surface epithelium is denuded off and response to this. He arrives today with uncontrolled edema in the right calf area as well as a fingernail injury in the right lateral calf. There is only a few open areas on the left 04/06/18; I gave him amoxicillin doxycycline over the last 2 weeks that the amoxicillin should be completing currently. He is not complaining of any pain or systemic symptoms. The only open areas see has is on the right lateral lower leg paradoxically I cannot see anything on the left lower leg. He tells me he is using his compression pumps twice a day on most days. Silver alginate to the wounds that are open under 4 layer compression 04/13/18; he completed antibiotics and has no new complaints. Using his compression pumps. Silver alginate that anything that's opened 04/20/18; he is using his compression pumps religiously.  Silver alginate 4 layer compression anything that's opened. He comes in today with no open wounds on the left leg but 3 on the right including a new one posteriorly. He has 2 on the right lateral and one on the right posterior. He likes Unna boots on the right leg for reasons that aren't really clear we had the usual 4 layer compression on the left. It may be necessary to move to the 4 layer compression on the right however for now I left them in the Unna boots 04/27/18; he is using his compression pumps at least once a day. He has still the wounds on the right lateral calf. The area right posteriorly has closed. He does not have an open wound on the left under 4 layer compression however on the dorsal left foot just proximal to the toes and the left third toe 2 small open areas were identified 05/11/18; he has not uses compression pumps. The areas on the right lateral calf have coalesced into one large wound necrotic surface. On the left side he has one small wound anteriorly however the edema is now weeping out of a large part of his left leg. He says he wasn't using his pumps because of the weeping fluid. I explained to him that this is the time he needs to pump more 05/18/18; patient states he is using his compression pumps twice a day. The area on the right  lateral large wound albeit superficial. On the left side he has innumerable number of small new wounds on the left calf particularly laterally but several anteriorly and medially. All these appear to have healthy granulated base these look like the remnants of blisters however they occurred under compression. The patient arrives in clinic today with his legs somewhat better. There is certainly less edema, less multiple open areas on the left calf and the right anterior leg looks somewhat better as well superficial and a little smaller. However he relates pain and erythema over the last 3-4 days in the thigh and I looked at this today. He has not  been systemically unwell no fever no chills no change in blood sugar values 05/25/18; comes in today in a better state. The severe cellulitis on his left leg seems better with the Keflex. Not as tender. He has not been systemically unwell Hard to find an open wound on the left lower leg using his compression pumps twice a day The confluent wounds on his right lateral calf somewhat better looking. These will ultimately need debridement I didn't do this today. 06/01/18; the severe cellulitis on the left anterior thigh has resolved and he is completed his Keflex. There is no open wound on the left leg however there is a superficial excoriation at the base of the third toe dorsally. Skin on the bottom of his left foot is macerated looking. The left the wounds on the lateral right leg actually looks some better although he did require debridement of the top half of this wound area with an open curet 06/09/18 on evaluation today patient appears to be doing poorly in regard to his right lower extremity in particular this appears to likely be infected he has very thick purulent discharge along with a bright green tent to the discharge. This makes me concerned about the possibility of pseudomonas. He's also having increased discomfort at this point on evaluation. Fortunately there does not appear to be any evidence of infection spreading to the other location at this time. 06/16/18 on evaluation today patient appears to actually be doing fairly well. His ulcer has actually diminished in size quite significantly at this point which is good news. Nonetheless he still does have some evidence of infection he did see infectious disease this morning before coming here for his appointment. I did review the results of their evaluation and their note today. They did actually have him discontinue the Cipro and initiate treatment with linezolid at this time. He is doing this for the next seven days and they recommended a  follow-up in four months with them. He is the keep a log of the need for intermittent antibiotic therapy between now and when he falls back up with infectious disease. This will help them gaze what exactly they need to do to try and help them out. 06/23/18; the patient arrives today with no open wounds on the left leg and left third toe healed. He is been using his compression pumps twice a day. On the right lateral leg he still has a sizable wound but this is a lot better than last time I saw this. 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 Has been using silver alginate under 4 layer compression 06/30/18; the only open wound he has is on the right lateral leg and this looks healthy. No debridement is required. We have been using  silver alginate. He does not have an open wound on the left leg. There is apparently some drainage from the dorsal proximal third toe on the left although I see no open wound here. 07/03/18 on evaluation today patient was actually here just for a nurse visit rapid change. However when he was here on Wednesday for his rat change due to having been healed on the left and then developing blisters we initiated the wrap again knowing that he would be back today for [place] to reevaluate and see were at. Unfortunately he has developed some cellulitis into the proximal portion of his right lower extremity even into the region of his thigh. 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 He was placed on one as what at that point. Nonetheless he is done with that and has been tolerating it well otherwise. Doxycycline which in the past really did not seem to be effective for him. Nonetheless I think the best option may be for [place] to definitely reinitiate the antibiotics for a longer period of time. 07/07/18; since I last saw this patient a week ago he has  had a difficult time. At that point he did not have an open wound on his left leg. We transitioned him into juxta light stockings. He was apparently in the clinic the next day with blisters on the left lateral and left medial lower calf. He also had weeping edema fluid. He was put back into a compression wrap. He was also in the clinic on Friday with intense erythema in his right thigh. Per the patient he was started on Bactrim however that didn't work at all in terms of relieving his pain and swelling. He has taken 3 doxycycline that he had left over from last time and that seems to of helped. He has blistering on the right thigh as well. 07/14/18; the erythema on his right thigh has gotten better with doxycycline that he is finishing. 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 Presumably this cellulitis in the thigh was not related to that although I think this is a bit concerning going forward. He still has an area on the right lateral calf the blister on the right medial calf just below the knee that was discussed above. On the left 2 small open areas left medial and left lateral. Edema control is adequate. He is using his compression pumps twice a day 07/20/18; continued improvement in the condition of both legs especially the edema in his bilateral thighs. He tells me he is been losing weight through a combination of diet and exercise. He is using his compression pumps twice a day. So overall she made to the remaining wounds 07/27/2018; continued improvement in condition of both legs. His edema is well controlled. The area on the right lateral leg is just about closed he had one blisters show up on the medial left upper calf. We have him in 4 layer compression. He is going on a 10-day trip to [place], [place]. He will be driving. He wants to wear Unna boots because of the lessening amount of constriction.  He will not use compression pumps while he is away 08/05/18 on evaluation today patient actually appears to be doing decently well all things considered in regard to his bilateral lower extremities. The worst ulcer is actually only posterior aspect of his left  lower extremity with a four layer compression wrap cut into his leg a couple weeks back. He did have a trip and actually had [employer] for the trip that he is worn since he was last here. Nonetheless he feels like the [employer] actually do better for him his swelling is up a little bit but he also with his trip was not taking his Lasix on a regular set schedule like he was supposed to be. He states that obviously the reason being that he cannot drive and keep going without having to urinate too frequently which makes it difficult. He did not have his pumps with him while he was away either which I think also maybe playing a role here too. 08/13/2018; the patient only has a small open wound on the right lateral calf which is a big improvement in the last month or 2. He also has the area posteriorly just below the posterior fossa on the left which I think was a wrap injury from several weeks ago. He has no current evidence of cellulitis. He tells me he is back into his compression pumps twice a day. He also tells me that while he was at the laundromat somebody stole a section of his extremitease stockings 08/20/2018; back in the clinic with a much improved state. He only has small areas on the right lateral mid calf which is just about healed. This was is more substantial area for quite a prolonged period of time. He has a small open area on the left anterior tibia. The area on the posterior calf just below the popliteal fossa is closed today. He is using his compression pumps twice a day 08/28/2018; patient has no open wound on the right leg. He has a smattering of open areas on the calf with some weeping lymphedema. More  problematically than that it looks as though his wraps of slipped down in his usual he has very angry upper area of edema just below the right medial knee and on the right lateral calf. He has no open area on his feet. The patient is traveling to [facility] next week. I will send him in an antibiotic. We will continue to wrap the right leg. We ordered extremitease stockings for him last week and I plan to transition the right leg to a stocking when he gets home which will be in 10 days time. As usual he is very reluctant to take his pumps with him when he travels 09/07/2018; patient returns from [facility]. He shows me a picture of his left leg in the mid part of his trip last week with intense fire engine erythema. The picture look bad enough I would have considered sending him to the hospital. Instead he went to the wound care center in [facility]. They did not prescribe him antibiotics but he did take some doxycycline he had leftover from a previous visit. I had given him trimethoprim sulfamethoxazole before he left this did not work according to the patient. This is resulted in some improvement fortunately. He comes back with a large wound on the left posterior calf. Smaller area on the left anterior tibia. Denuded blisters on the dorsal left foot over his toes. Does not have much in the way of wounds on the right leg although he does have a very tender area on the right posterior area just below the popliteal fossa also suggestive of infection. He promises me he is back on his pumps twice  a day 09/15/2018; the intense cellulitis in his left lower calf is a lot better. The wound area on the posterior left calf is also so better. However he has reasonably extensive wounds on the dorsal aspect of his second and third toes and the proximal foot just at the base of the toes. There is nothing open on the right leg 09/22/2018; the patient has excellent edema control in his legs  bilaterally. He is using his external compression pumps twice a day. He has no open area on the right leg and only the areas in the left foot dorsally second and third toe area on the left side. He does not have any signs of active cellulitis. 10/06/2018; the patient has good edema control bilaterally. He has no open wound on the right leg. There is a blister in the posterior aspect of his left calf that we had to deal with today. He is using his compression pumps twice a day. There is no signs of active cellulitis. We have been using silver alginate to the wound areas. He still has vulnerable areas on the base of his left first second toes dorsally He has a his extremities stockings and we are going to transition him today into the stocking on the right leg. He is cautioned that he will need to continue to use the compression pumps twice a day. If he notices uncontrolled edema in the right leg he may need to go to 3 times a day. 10/13/2018; the patient came in for a nurse check on Friday he has a large flaccid blister on the right medial calf just below the knee. We unroofed this. He has this and a new area underneath the posterior mid calf which was undoubtedly a blister as well. He also has several small areas on the right which is the area we put his extremities stocking on. 10/19/2018; the patient went to see infectious disease this morning I am not sure if that was a routine follow-up in any case the doxycycline I had given him was discontinued and started on linezolid. He has not started this. It is easy to look at his left calf and the inflammation and think this is cellulitis however he is very tender in the tissue just below the popliteal fossa and I have no doubt that there is infection going on here. He states the problem he is having is that with the compression pumps the edema goes down and then starts walking the wrap falls down. We will see if we can adhere this. He has 1 or 2 minuscule  open areas on the right still areas that are weeping on the posterior left calf, the base of his left second and third toes 10/26/18; back today in clinic with quite of skin breakdown in his left anterior leg. This may have been infection the area below the popliteal fossa seems a lot better however tremendous epithelial loss on the left anterior mid tibia area over quite inexpensive tissue. He has 2 blisters on the right side but no other open wound here. 10/29/2018; came in urgently to see [place] today and we worked him in for review. He states that the 4 layer compression on the right leg caused pain he had to cut it down to roughly his mid calf this caused swelling above the wrap and he has blisters and skin breakdown today. As a result of the pain he has not been using his pumps. Both legs are a lot more edematous and  there is a lot of weeping fluid. 11/02/18; arrives in clinic with continued difficulties in the right leg> left. Leg is swollen and painful. multiple skin blisters and new open areas especially laterally. He has not been using his pumps on the right leg. 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 He is not systemically unwell. 11/09/2018; the patient claims he is being compliant with his pumps. He is finished the doxycycline I gave him last week. Culture I did of the wound on the right lateral leg showed a few very resistant methicillin staph aureus. This was resistant to doxycycline. Nevertheless he states the pain in the leg is a lot better which makes me wonder if the cultured organism was not really what was causing the problem nevertheless this is a very dangerous organism to be culturing out of any wound. His right leg is still a lot larger than the left. He is using an Radio broadcast assistant on this area, he blames a 4-layer compression for causing the original skin breakdown which I doubt is true however I cannot talk him out of it. We have been  using silver alginate to all of these areas which were initially blisters 11/16/2018; patient is being compliant with his external compression pumps at twice a day. Miraculously he arrives in clinic today with absolutely no open wounds. He has better edema control on the left where he has been using 4 layer compression versus wound of wounds on the right and I pointed this out to him. There is no inflammation in the skin in his lower legs which is also somewhat unusual for him. There is no open wounds on the dorsal left foot. He has extremitease stockings at home and I have asked him to bring these in next week. 11/25/18 patient's lower extremity on examination today on the left appears for the most part to be wound free. He does have an open wound on the lateral aspect of the right lower extremity but this is minimal compared to what I've seen in past. He does request that we go ahead and wrap the left leg as well even though there's nothing open just so hopefully it will not reopen in short order. 1/28; patient has superficial open wounds on the right lateral calf left anterior calf and left posterior calf. His edema control is adequate. 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 We have been using silver alginate as the primary dressing. He claims compliance with his compression pumps 2/4; patient has superficial open wounds on numerous areas of his left calf and again one on the left dorsal foot. The areas on the right lateral calf have healed. The cellulitis that I gave him doxycycline for last week is also resolved this was mostly on the left anterior calf just below the tibial tuberosity. His edema looks fairly well-controlled. He tells me he went to see his primary doctor today and had blood work ordered 2/11; once again he has several open areas on the left calf left tibial area. Most of these are small and  appear to have healthy granulation. He does not have anything open on the right. The edema and control in his thighs is pretty good which is usually a good indication he has been using his pumps as requested. 2/18; he continues to have several small areas on the left calf and left tibial area. Most  of these are small healthy granulation. We put him in his stocking on the right leg last week and he arrives with a superficial open area over the right upper tibia and a fairly large area on the right lateral tibia in similar condition. His edema control actually does not look too bad, he claims to be using his compression pumps twice a day 2/25. Continued small areas on the left calf and left tibial area. New areas especially on the right are identified just below the tibial tuberosity and on the right upper tibia itself. There are also areas of weeping edema fluid even without an obvious wound. He does not have a considerable degree of lymphedema but clearly there is more edema here than his skin can handle. He states he is using the pumps twice a day. We have an Unna boot on the right and 4 layer compression on the left. 3/3; he continues to have an area on the right lateral calf and right posterior calf just below the popliteal fossa. There is a fair amount of tenderness around the wound on the popliteal fossa but I did not see any evidence of cellulitis, could just be that the wrap came down and rubbed in this area. He does not have an open area on the left leg however there is an area on the left dorsal foot at the base of the third toe We have been using silver alginate to all wound areas 3/10; he did not have an open area on his left leg last time he was here a week ago. T oday he arrives with a horizontal wound just below the tibial tuberosity and an area on the left lateral calf. He has intense erythema and tenderness in this area. The area is on the right lateral calf and right posterior calf  better than last week. We have been using silver alginate as usual 3/18 - Patient returns with 3 small open areas on left calf, and 1 small open area on right calf, the skin looks ok with no significant erythema, he continues the UNA boot on right and 4 layer compression on left. The right lateral calf wound is closed , the right posterior is small area. we will continue silver alginate to the areas. 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 He is been tolerating the dressing changes without complication. Fortunately she has made excellent improvement in regard to the overall status of his wounds. Unfortunately every time we cease wrapping him he ends up reopening in causing more significant issues at that point. Again I'm unsure of the best direction to take although I think the lymphedema clinic may be appropriate for him. 02/03/19 on evaluation today patient appears to be doing well in regard to the wounds that we saw him for last week unfortunately he has a new area on the proximal portion of his right medial/posterior lower extremity where the wrap somewhat slowed down and caused swelling and a blister to rub and open. Unfortunately this is the only opening that he has on either leg at this point. 02/17/19 on evaluation today patient's bilateral lower extremities appear to be doing well. He still completely healed in regard to the left lower extremity. In regard to the right lower extremity the area where the wrap and slid down and caused  the blister still seems to be slightly open although this is dramatically better than during the last evaluation two weeks ago. I'm very pleased with the way this stands overall. 03/03/19 on evaluation today patient appears to be doing well in regard to his right lower  extremity in general although he did have a new blister open this does not appear to be showing any evidence of active infection at this time. Fortunately there's No fevers, chills, nausea, or vomiting noted at this time. Overall I feel like he is making good progress it does feel like that the right leg will we perform the [employer] seems to do with a bit better than three layer wrap on the left which slid down on him. We may switch to doing bilateral in the book wraps. 5/4; I have not seen Mr. Julian in quite some time. According to our case manager he did not have an open wound on his left leg last week. He had 1 remaining wound on the right posterior medial calf. He arrives today with multiple openings on the left leg probably were blisters and/or wrap injuries from Unna boots. I do not think the Unna boot's will provide adequate compression on the left. I am also not clear about the frequency he is using the compression pumps. 03/17/19 on evaluation today patient appears to be doing excellent in regard to his lower extremities compared to last week's evaluation apparently. He had gotten significantly worse last week which is unfortunate. The [employer] wrap on the left did not seem to do very well for him at all and in fact it didn't control his swelling significantly enough he had an additional outbreak. Subsequently we go back to the four layer compression wrap on the left. This is good news. At least in that he is doing better and the wound seem to be killing him. He still has not heard anything from the lymphedema clinic. 03/24/19 on evaluation today patient actually appears to be doing much better in regard to his bilateral lower Trinity as compared to last week when I saw him. Fortunately there's no signs of active infection at this time. He has been tolerating the dressing changes without complication. Overall I'm extremely pleased with the progress and appearance in general. 04/07/19 on  evaluation today patient appears to be doing well in regard to his bilateral lower extremities. His swelling is significantly down from where it was previous. With that being said he does have a couple blisters still open at this point but fortunately nothing that seems to be too severe and again the majority of the larger openings has healed at this time. 04/14/19 on evaluation today patient actually appears to be doing quite well in regard to his bilateral lower extremities in fact I'm not even sure there's anything significantly open at this time at any site. Nonetheless he did have some trouble with these wraps where they are somewhat irritating him secondary to the fact that he has noted that the graph wasn't too close down to the end of this foot in a little bit short as well up to his knee. Otherwise things seem to be doing quite well. 04/21/19 upon evaluation today patient's wound bed actually showed evidence of being completely healed in regard to both lower extremities which is excellent news. There does not appear to be any signs of active infection which is also good news. I'm very pleased in this regard. No fevers, chills, nausea, or vomiting noted at this  time. 04/28/19 on evaluation today patient appears to be doing a little bit worse in regard to both lower extremities on the left mainly due to the fact that when he went infection disease the wrap was not wrapped quite high enough he developed a blister above this. On the right he is a small open area of nothing too significant but again this is continuing to give him some trouble he has been were in the Velcro compression that he has at home. 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 He's been tolerating the dressing changes without complication. Fortunately there's no signs of active infection at this time. No fevers, chills, nausea, or vomiting noted at this time. We have  been trying to get an appointment with her lymphedema clinic in [facility] but unfortunately nobody can get them on phone with not been able to even fax information over the patient likewise is not been able to get in touch with them. Overall I'm not sure exactly what's going on here with to reach out again today. 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 Still having a lot of drainage unfortunately. He tells me especially in the left but even on the right. There's no signs of active infection which is good news we've been using so ratcheted up to this point. 05/19/19 on evaluation today patient actually appears to be doing quite well with regard to his left lower extremity which is great news. Fortunately in regard to the right lower extremity has an issues with his wrap and he subsequently did remove this from what I'm understanding. Nonetheless long story short is what he had rewrapped once he removed it subsequently had maggots underneath this wrap whenever he came in for evaluation today. With that being said they were obviously completely cleaned away by the nursing staff. The visit today which is excellent news. However he does appear to potentially have some infection around the right ankle region where the maggots were located as well. He will likely require anabiotic therapy today. 05/26/19 on evaluation today patient actually appears to be doing much better in regard to his bilateral lower extremities. I feel like the infection is under much better control. With that being said there were maggots noted when the wrap was removed yet again today. Again this could have potentially been left over from previous although at this time there does not appear to be any signs of significant drainage there was obviously on the wrap some drainage as well this contracted gnats or otherwise. Either way I do  not see anything that appears to be doing worse in my pinion and in fact I think his drainage has slowed down quite significantly likely mainly due to the fact to his infection being under better control. 06/02/2019 on evaluation today patient actually appears to be doing well with regard to his bilateral lower extremities there is no signs of active infection at this time which is great news. With that being said he does have several open areas more so on the right than the left but nonetheless these are all significantly better than previously noted. 06/09/2019 on evaluation today patient actually appears to be doing well. His wrap stayed up and he did not cause any problems he had more drainage on the right compared to the left but overall I do not see any major  issues at this time which is great news. 06/16/2019 on evaluation today patient appears to be doing excellent with regard to his lower extremities the only area that is open is a new blister that can have opened as of today on the medial ankle on the left. Other than this he really seems to be doing great I see no major issues at this point. 06/23/2019 on evaluation today patient appears to be doing quite well with regard to his bilateral lower extremities. In fact he actually appears to be almost completely healed there is a small area of weeping noted of the right lower extremity just above the ankle. Nonetheless fortunately there is no signs of active infection at this time which is good news. No fevers, chills, nausea, vomiting, or diarrhea. 8/24; the patient arrived for a nurse visit today but complained of very significant pain in the left leg and therefore I was asked to look at this. Noted that he did not have an open area on the left leg last week nevertheless this was wrapped. The patient states that he is not been able to put his compression pumps on the left leg because of the discomfort. He has not been systemically unwell 06/30/2019  on evaluation today patient unfortunately despite being excellent last week is doing much worse with regard to his left lower extremity today. In fact he had to come in for a nurse on Monday where his left leg had to be rewrapped due to excessive weeping Dr. Leanord Hawking placed him on doxycycline at that point. Fortunately there is no signs of active infection Systemically at this time which is good news. 07/07/2019 in regard to the patient's wounds today he actually seems to be doing well with his right lower extremity there really is nothing open or draining at this point this is great news. Unfortunately the left lower extremity is given him additional trouble at this time. There does not appear to be any signs of active infection nonetheless he does have a lot of edema and swelling noted at this point as well as blistering all of which has led to a much more poor appearing leg at this time compared to where it was 2 weeks ago when it was almost completely healed. Obviously this is a little discouraging for the patient. He is try to contact the lymphedema clinic in [place] he has not been able to get through to them. 07/14/2019 on evaluation today patient actually appears to be doing slightly better with regard to his left lower extremity ulcers. Overall I do feel like at least at the top of the wrap that we have been placing this area has healed quite nicely and looks much better. The remainder of the leg is showing signs of improvement. Unfortunately in the thigh area he still has an open region on the left and again on the right he has been utilizing just a Band-Aid on an area that also opened on the thigh. Again this is an area that were not able to wrap although we did do an Ace wrap to provide some compression that something that obviously is a little less effective than the compression wraps we have been using on the lower portion of the leg. He does have an appointment with the lymphedema clinic  in [facility] on Friday. 07/21/2019 on evaluation today patient appears to be doing better with regard to his lower extremity ulcers. He has been tolerating the dressing changes without complication. Fortunately there is no signs  of active infection at this time. No fevers, chills, nausea, vomiting, or diarrhea. I did receive the paperwork from the physical therapist at the lymphedema clinic in [place]. Subsequently I signed off on that this morning and sent that back to him for further progression with the treatment plan. 07/28/2019 on evaluation today patient appears to be doing very well with regard to his right lower extremity where I do not see any open wounds at this point. Fortunately he is feeling great as far as that is concerned as well. In regard to the left lower extremity he has been having issues with still several areas of weeping and edema although the upper leg is doing better his lower leg still I think is going require the compression wrap at this time. No fevers, chills, nausea, vomiting, or diarrhea. 08/04/2019 on evaluation today patient unfortunately is having new wounds on the right lower extremity. Again we have been using Unna boot wrap on that side. We switched him to using his juxta lite wrap at home. With that being said he tells me he has been using it although his legs extremely swollen and to be honest really does not appear that he has been. I cannot know that for sure however. Nonetheless he has multiple new wounds on the right lower extremity at this time. Obviously we will have to see about getting this rewrapped for him today. 08/11/2019 on evaluation today patient appears to be doing fairly well with regard to his wounds. He has been tolerating the dressing changes including the compression wraps without complication. He still has a lot of edema in his upper thigh regions bilaterally he is supposed to be seeing the lymphedema clinic on the 15th of this month  once his wraps arrive for the upper part of his legs. 08/18/2019 on evaluation today patient appears to be doing well with regard to his bilateral lower extremities at this point. He has been tolerating the dressing changes without complication. Fortunately there is no signs of active infection which is also good news. He does have a couple weeping areas on the first and second toe of the right foot he also has just a small area on the left foot upper leg and a small area on the left lower leg but overall he is doing quite well in my opinion. He is supposed to be getting his wraps shortly in fact tomorrow and then subsequently is seeing the lymphedema clinic next Wednesday on the 21st. Of note he is also leaving on the 25th to go on vacation for a week to the beach. For that reason and since there is some uncertainty about what there can be doing at lymphedema clinic next Wednesday I am get a make an appointment for next Friday here for [place] to see what we need to do for him prior to him leaving for vacation. 10/23; patient arrives in considerable pain predominantly in the upper posterior calf just distal to the popliteal fossa also in the wound anteriorly above the major wound. This is probably cellulitis and he has had this recurrently in the past. He has no open wound on the right side and he has had an Radio broadcast assistant in that area. Finally I note that he has an area on the left posterior calf which by enlarge is mostly epithelialized. This protrudes beyond the borders of the surrounding skin in the setting of dry scaly skin and lymphedema. The patient is leaving for [facility] on Sunday. Per his longstanding  pattern, he will not take his compression pumps with him predominantly out of fear that they will be stolen. He therefore asked that we put a Unna boot back on the right leg. He will also contact the wound care center in [facility] to see if they can change his dressing in the mid week. 11/3;  patient returned from his vacation to [facility]. He was seen on 1 occasion at their wound care center. They did a 2 layer compression system as they did not have our 4-layer wrap. I am not completely certain what they put on the wounds. They did not change the Unna boot on the right. The patient is also seeing a lymphedema specialist physical therapist in [place]. It appears that he has some compression sleeve for his thighs which indeed look quite a bit better than I am used to seeing. He pumps over these with his external compression pumps. 11/10; the patient has a new wound on the right medial thigh otherwise there is no open areas on the right. He has an area on the left leg posteriorly anteriorly and medially and an area over the left second toe. We have been using silver alginate. He thinks the injury on his thigh is secondary to friction from the compression sleeve he has. 11/17; the patient has a new wound on the right medial thigh last week. He thinks this is because he did not have a underlying stocking for his thigh juxta lite apparatus. He now has this. The area is fairly large and somewhat angry but I do not think he has underlying cellulitis. He has a intact blister on the right anterior tibial area. Small wound on the right great toe dorsally Small area on the medial left calf. 11/30; the patient does not have any open areas on his right leg and we did not take his juxta lite stocking off. However he states that on Friday his compression wrap fell down lodging around his upper mid calf area. As usual this creates a lot of problems for him. He called urgently today to be seen for a nurse visit however the nurse visit turned into a provider visit because of extreme erythema and pain in the left anterior tibia extending laterally and posteriorly. The area that is problematic is extensive 10/06/2019 upon evaluation today patient actually appears to be doing poorly in regard to his  left lower extremity. He Dr. Leanord Hawking did place him on doxycycline this past Monday apparently due to the fact that he was doing much worse in regard to this left leg. Fortunately the doxycycline does seem to be helping. Unfortunately we are still having a very difficult time getting his edema under any type of control in order to anticipate discharge at some point. The only way were really able to control his lymphedema really is with compression wraps and that has only even seemingly temporary. He has been seeing a lymphedema clinic they are trying to help in this regard but still this has been somewhat frustrating in general for the patient. 10/13/19 on evaluation today patient appears to be doing excellent with regard to his right lower extremity as far as the wounds are concerned. His swelling is still quite extensive unfortunately. He is still having a lot of drainage from the thigh areas bilaterally which is unfortunate. He's been going to lymphedema clinic but again he still really does not have this edema under control as far as his lower extremities are concern. With regard to his  left lower extremity this seems to be improving and I do believe the doxycycline has been of benefit for him. He is about to complete the doxycycline. 10/20/2019 on evaluation today patient appears to be doing poorly in regard to his bilateral lower extremities. More in the right thigh he has a lot of irritation at this site unfortunately. In regard to the left lower extremity the wrap was not quite as high it appears and does seem to have caused him some trouble as well. Fortunately there is no evidence of systemic infection though he does have some blue-green drainage which has me concerned for the possibility of Pseudomonas. He tells me he is previously taking Cipro without complications and he really does not care for Levaquin however due to some of the side effects he has. He is not allergic to any medications  specifically antibiotics that were aware of. 10/27/2019 on evaluation today patient actually does appear to be for the most part doing better when compared to last week's evaluation. With that being said he still has multiple open wounds over the bilateral lower extremities. He actually forgot to start taking the Cipro and states that he still has the whole bottle. He does have several new blisters on left lower extremity today I think I would recommend he go ahead and take the Cipro based on what I am seeing at this point. 12/30-Patient comes at 1 week visit, 4 layer compression wraps on the left and Unna boot on the right, primary dressing Xtrasorb and silver alginate. Patient is taking his Cipro and has a few more days left probably 5-6, and the legs are doing better. He states he is using his compressions devices which I believe he has 11/10/2019 on evaluation today patient actually appears to be much better than last time I saw him 2 weeks ago. His wounds are significantly improved and overall I am very pleased in this regard. Fortunately there is no signs of active infection at this time. He is just a couple of days away from completing Cipro. Overall his edema is much better he has been using his lymphedema pumps which I think is also helping at this point. 11/17/2019 on evaluation today patient appears to be doing excellent in regard to his wounds in general. His legs are swollen but not nearly as much as they have been in the past. Fortunately he is tolerating the compression wraps without complication. No fevers, chills, nausea, vomiting, or diarrhea. He does have some erythema however in the distal portion of his right lower extremity specifically around the forefoot and toes there is a little bit of warmth here as well. 11/24/2019 on evaluation today patient appears to be doing well with regard to his right lower extremity I really do not see any open wounds at this point. His left lower  extremity does have several open areas and his right medial thigh also is open. Other than this however overall the patient seems to be making good progress and I am very pleased at this point. 12/01/2019 on evaluation today patient appears to be doing poorly at this point in regard to his left lower extremity has several new blisters despite the fact that we have him in compression wraps. In fact he had a 4-layer compression wrap, his upper thigh wrapped from lymphedema clinic, and a juxta light over top of the 4 layer compression wrap the lymphedema clinic applied and despite all this he still develop blisters underneath. Obviously this does have me  concerned about the fact that unfortunately despite what we are doing to try to get wounds healed he continues to have new areas arise I do not think he is ever good to be at the point where he can realistically just use wraps at home to keep things under control. Typically when we heal him it takes about 1-2 days before he is back in the clinic with severe breakdown and blistering of his lower extremities bilaterally. This is happened numerous times in the past. Unfortunately I think that we may need some help as far as overall fluid overload to kind of limit what we are seeing and get things under better control. 12/08/2019 on evaluation today patient presents for follow-up concerning his ongoing bilateral lower extremity edema. Unfortunately he is still having quite a bit of swelling the compression wraps are controlling this to some degree but he did see Dr. [name] his cardiologist I do have that available for review today as far as the appointment was concerned that was on 12/06/2019. Obviously that she has been 2 days ago. The patient states that he is only been taking the Lasix 80 mg 1 time a day he had told me previously he was taking this twice a day. Nonetheless Dr. [name] recommended this be up to 80 mg 2 times a day for the patient as he did appear to  be fluid overloaded. With that being said the patient states he did this yesterday and he was unable to go anywhere or do anything due to the fact that he was constantly having to urinate. Nonetheless I think that this is still good to be something that is important for him as far as trying to get his edema under control at all things that he is going to be able to just expect his wounds to get under control and things to be better without going through at least a period of time where he is trying to stabilize his fluid management in general and I think increasing the Lasix is likely the first step here. It was also mentioned the possibility that the patient may require metolazone. With that being said he wanted to have the patient take Lasix twice a day first and then reevaluating 2 months to see where things stand. 12/15/2019 upon evaluation today patient appears to be doing regard to his legs although his toes are showing some signs of weeping especially on the left at this point to some degree on the right. There does not appear to be any signs of active infection and overall I do feel like the compression wraps are doing well for him but he has not been able to take the Lasix at home and the increased dose that Dr. [name] recommended. He tells me that just not go to be feasible for him. Nonetheless I think in this case he should probably send a message to Dr. [name] in order to discuss options from the standpoint of possible admission to get the fluid off or otherwise going forward. 12/22/2019 upon evaluation today patient appears to be doing fairly well with regard to his lower extremities at this point. In fact he would be doing excellent if it was not for the fact that his right anterior thigh apparently had an allergic reaction to adhesive tape that he used. The wound itself that we have been monitoring actually appears to be healed. There is a lot of irritation at this point. 12/29/2019 upon  evaluation today patient appears to be doing  well in regard to his lower extremities. His left medial thigh is open and somewhat draining today but this is the only region that is open the right has done much better with the treatment utilizing the steroid cream that I prescribed for him last week. Overall I am pleased in that regard. Fortunately there is no signs of active infection at this time. No fevers, chills, nausea, vomiting, or diarrhea. 01/05/2020 upon evaluation today patient appears to be doing more poorly in regard to his right lower extremity at this point upon evaluation today. Unfortunately he continues to have issues in this regard and I think the biggest issue is controlling his edema. This obviously is not very well controlled at this point is been recommended that he use the Lasix twice a day but he has not been able to do that. Unfortunately I think this is leading to an issue where honestly he is not really able to effectively control his edema and therefore the wounds really are not doing significantly better. I do not think that he is going to be able to keep things under good control unless he is able to control his edema much better. I discussed this again in great detail with him today. 01/12/2020 good news is patient actually appears to be doing quite well today at this point. He does have an appointment with lymphedema clinic tomorrow. His legs appear healed and the toe on the left is almost completely healed. In general I am very pleased with how things stand at this point. 01/19/2020 upon evaluation today patient appears to actually be doing well in regard to his lower extremities there is nothing open at this point. Fortunately he has done extremely well more recently. Has been seeing lymphedema clinic as well. With that being said he has Velcro wraps for his lower legs as well as his upper legs. The only wound really is on his toe which is the right great toe and this is  barely anything even there. With all that being said I think it is good to be appropriate today to go ahead and switch him over to the Velcro compression wraps. 01/26/2020 upon evaluation today patient appears to be doing worse with regard to his lower extremities after last week switch him to Velcro compression wraps. Unfortunately he lasted less than 24 hours he did not have the sock portion of his Velcro wrap on the left leg and subsequently developed a blister underneath the Velcro portion. Obviously this is not good and not what we were looking for at this point. He states the lymphedema clinic did tell him to wear the wrap for 23 hours and take him off for 1 I am okay with that plan but again right now we got a get things back under control again he may have some cellulitis noted as well. 02/02/2020 upon evaluation today patient unfortunately appears to have several areas of blistering on his bilateral lower extremities today mainly on the feet. His legs do seem to be doing somewhat better which is good news. Fortunately there is no evidence of active infection at this time. No fevers, chills, nausea, vomiting, or diarrhea. 02/16/2020 upon evaluation today patient appears to be doing well at this time with regard to his legs. He has a couple weeping areas on his toes but for the most part everything is doing better and does appear to be sealed up on his legs which is excellent news. We can continue with wrapping him at this  point as he had every time we discontinue the wraps he just breaks out with new wounds. There is really no point in is going forward with this at this point. 03/08/2020 upon evaluation today patient actually appears to be doing quite well with regard to his lower extremity ulcers. He has just a very superficial and really almost nonexistent blister on the left lower extremity he has in general done very well with the compression wraps. With that being said I do not see any signs  of infection at this time which is good news. 03/29/2020 upon evaluation today patient appears to be doing well with regard to his wounds currently except for where he had several new areas that opened up due to some of the wrap slipping and causing him trouble. He states he did not realize they had slipped. Nonetheless he has a 1 area on the right and 3 new areas on the left. Fortunately there is no signs of active infection at this time which is great news. 04/05/2020 upon evaluation today patient actually appears to be doing quite well in general in regard to his legs currently. Fortunately there is no signs of active infection at this time. No fevers, chills, nausea, vomiting, or diarrhea. He tells me next week that he will actually be seen in the lymphedema clinic on Thursday at 10 AM I see him on Wednesday next week. 04/12/2020 upon evaluation today patient appears to be doing very well with regard to his lower extremities bilaterally. In fact he does not appear to have any open wounds at this point which is good news. Fortunately there is no signs of active infection at this time. No fevers, chills, nausea, vomiting, or diarrhea. 04/19/2020 upon evaluation today patient appears to be doing well with regard to his wounds currently on the bilateral lower extremities. There does not appear to be any signs of active infection at this time. Fortunately there is no evidence of systemic infection and overall very pleased at this point. Nonetheless after I held him out last week he literally had blisters the next morning already which swelled up with him being right back here in the clinic. Overall I think that he is just not can be able to be discharged with his legs the way they are he is much to volume overloaded as far as fluid is concerned and that was discussed with him today of also discussed this but should try the clinic nurse manager as well as Dr. Leanord Hawking. 04/26/2020 upon evaluation today patient  appears to be doing better with regard to his wounds currently. He is making some progress and overall swelling is under good control with the compression wraps. Fortunately there is no evidence of active infection at this time. 05/10/2020 on evaluation today patient appears to be doing overall well in regard to his lower extremities bilaterally. He is Tolerating the compression wraps without complication and with what we are seeing currently I feel like that he is making excellent progress. There is no signs of active infection at this time. 05/24/2020 upon evaluation today patient appears to be doing well in regard to his legs. The swelling is actually quite a bit down compared to where it has been in the past. Fortunately there is no sign of active infection at this time which is also good news. With that being said he does have several wounds on his toes that have opened up at this point. 05/31/2020 upon evaluation today patient appears to be doing  well with regard to his legs bilaterally where he really has no significant fluid buildup at this point overall he seems to be doing quite well. Very pleased in this regard. With regard to his toes these also seem to be drying up which is excellent. We have continue to wrap him as every time we tried as a transition to the juxta light wraps things just do not seem to get any better. Electronic Signature(s) Signed: 05/31/2020 2:15:09 PM By: Lenda Kelp PA-C Entered By: Lenda Kelp on 05/31/2020 14:15:09 -------------------------------------------------------------------------------- Physical Exam Details Patient Name: Date of Service: CO VERMON, GRAYS. 05/31/2020 1:15 PM Medical Record Number: [id number] Patient Account Number: [id number] Date of Birth/Sex: Treating RN: June 17, 1951 (69 y.o. [name] Primary Care Provider: Marney Setting, [name] Other Clinician: Referring Provider: Treating Provider/Extender: Lenda Kelp MCGO [name],  [name] in Treatment: 227 Constitutional Well-nourished and well-hydrated in no acute distress. Respiratory normal breathing without difficulty. Psychiatric this patient is able to make decisions and demonstrates good insight into disease process. Alert and Oriented x 3. pleasant and cooperative. Notes Upon inspection patient's wounds actually showed signs of improvement there is really nothing on the legs at all in regard to his toes he has just a couple very small open areas at this point. Overall very pleased with where things stand there is no signs of active infection at this time. Electronic Signature(s) Signed: 05/31/2020 2:15:27 PM By: Lenda Kelp PA-C Entered By: Lenda Kelp on 05/31/2020 14:15:27 -------------------------------------------------------------------------------- Physician Orders Details Patient Name: Date of Service: [name], Artavius [name]. 05/31/2020 1:15 PM Medical Record Number: [id number] Patient Account Number: [id number] Date of Birth/Sex: Treating RN: 1951/09/12 (69 y.o. 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 - Wed with Leonard Schwartz Return A Nurse Visit: - 1 week for rewrap Dressing Change Frequency Do not change entire dressing for  one week. - both legs Skin Barriers/Peri-Wound Care Moisturizing lotion - both legs Wound Cleansing May shower with protection. Primary Wound Dressing Wound #174 Left,Dorsal Foot Calcium Alginate with Silver Wound #177 Right T Great oe Calcium Alginate with Silver Wound #178 Right T Second oe Calcium Alginate with Silver Wound #179 Left T Fourth oe Calcium Alginate with Silver Secondary Dressing Wound #174 Left,Dorsal Foot Dry Gauze Wound #177 Right T Great oe Dry Gauze Wound #178 Right T Second oe Dry Gauze Wound #179 Left T Fourth oe Dry Gauze Edema Control 4 layer compression: Left lower extremity Unna Boot to Right Lower Extremity Avoid standing for long periods of time Elevate legs to the level of the heart or above for 30 minutes daily and/or when sitting, a frequency of: - throughout the day Exercise regularly Electronic Signature(s) Signed: 05/31/2020 6:38:59 PM By: Zenaida Deed RN, BSN Signed: 05/31/2020 6:41:43 PM By: Lenda Kelp PA-C Entered By: Zenaida Deed on 05/31/2020 14:14:44 -------------------------------------------------------------------------------- Problem List Details Patient Name: Date of Service: [name], Coyt  [name]. 05/31/2020 1:15 PM Medical Record Number: [id number] Patient Account Number: [id number] Date of Birth/Sex: Treating RN: 1951-03-04 (69 y.o. [name] Primary Care Provider: Marney Setting, [name] Other Clinician: Referring Provider: Treating Provider/Extender: Suzy Bouchard, [name] in Treatment: 227 Active Problems ICD-10 Encounter Code Description Active Date MDM Diagnosis 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 skin 06/30/2018 No Yes L97.221 Non-pressure chronic ulcer of left calf limited to breakdown of skin 09/30/2016 No Yes 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 of 01/22/2016 No Yes bilateral lower extremity I89.0  Lymphedema, not elsewhere classified 01/22/2016 No Yes E11.622 Type 2 diabetes mellitus with other skin ulcer 01/22/2016 No Yes 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, unspecified 01/22/2016 No Yes L03.116 Cellulitis of left lower limb 04/01/2017 No Yes Inactive Problems ICD-10 Code Description Active Date Inactive Date 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 skin 06/30/2017 06/30/2017 L97.521 Non-pressure chronic ulcer of other 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 of skin 04/27/2018 04/27/2018 L03.115 Cellulitis of right lower limb 12/22/2017 12/22/2017 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 06/30/2018 06/30/2018 L97.511 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 of skin 06/30/2018 06/30/2018 Resolved Problems Electronic Signature(s) Signed: 05/31/2020 1:22:05 PM By: Lenda Kelp PA-C Entered By: Lenda Kelp on 05/31/2020 13:22:03 -------------------------------------------------------------------------------- Progress Note Details Patient Name: Date of Service: [name], Sherlock [name]. 05/31/2020 1:15 PM Medical Record Number: [id number] Patient Account Number: [id number] Date of Birth/Sex: Treating RN: 1951-02-07 (69 y.o. [name] Primary Care Provider: Marney Setting, [name] Other Clinician: Referring Provider: Treating Provider/Extender: Lenda Kelp MCGO [name], [name] in Treatment: 227 Subjective Chief Complaint Information obtained from Patient patient is here for evaluation venous/lymphedema weeping History of Present Illness (HPI) Referred by PCP for consultation. Patient has long standing history of BLE venous stasis, no prior ulcerations. At beginning of month, developed cellulitis and weeping. Received IM Rocephin followed by Keflex and resolved. Wears compression stocking, appr 6 months old. Not sure strength. No present drainage. 01/22/16 this is a  patient who is a type II diabetic on insulin. 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 He tells me he religiously wears pressure stockings of uncertain strength. He was here with weeping edema about 8 months ago but did not have an open wound. Roughly a month ago he had a reopening on his bilateral legs. He is been using bandages and Neosporin. He does not complain of pain. 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 Last BUN/creatinine I have is from 11/20/15 at 13 and 1.0 respectively 01/29/16; patient arrives today having tolerated the Profore wrap. He brought in his stockings and these are 18 mmHg stockings he bought from [place]. The compression here is likely inadequate. He does not complain of pain or excessive drainage she has no systemic symptoms. The wound on the right looks improved as does the one on the left although one on the left is more substantial with still tissue at risk below the actual wound area on the bilateral posterior calf 02/05/16; patient arrives with poor edema control. He states that we did put a 4 layer compression on it last week. No weight appear 5 this. 02/12/16; the area on the posterior right Has healed. The left Has a  substantial wound that has necrotic surface eschar that requires a debridement with a curette. 02/16/16;the patient called or a Nurse visit secondary to increased swelling. He had been in earlier in the week with his right leg healed. He was transitioned to is on pressure stocking on the right leg with the only open wound on the left, a substantial area on the left posterior calf. Note he has a history of severe lower extremity edema, he has a history of chronic atrial fibrillation but not heart failure per my notes but I'll need to research this. He is not complaining of chest pain shortness of breath or orthopnea.  The intake nurse noted blisters on the previously closed right leg 02/19/16; this is the patient's regular visit day. I see him on Friday with escalating edema new wounds on the right leg and clear signs of at least right ventricular heart failure. I increased his Lasix to 40 twice a day. He is returning currently in follow-up. States he is noticed a decrease in that the edema 02/26/16 patient's legs have much less edema. There is nothing really open on the right leg. The left leg has improved condition of the large superficial wound on the posterior left leg 03/04/16; edema control is very much better. The patient's right leg wounds have healed. On the left leg he continues to have severe venous inflammation on the posterior aspect of the left leg. There is no tenderness and I don't think any of this is cellulitis. 03/11/16; patient's right leg is married healed and he is in his own stocking. The patient's left leg has deteriorated somewhat. There is a lot of erythema around the wound on the posterior left leg. There is also a significant rim of erythema posteriorly just above where the wrap would've ended there is a new wound in this location and a lot of tenderness. Can't rule out cellulitis in this area. 03/15/16; patient's right leg remains healed and he is in his own stocking. The patient's left leg is much better than last review. His major wound on the posterior aspect of his left Is almost fully epithelialized. He has 3 small injuries from the wraps. Really. Erythema seems a lot better on antibiotics 03/18/16; right leg remains healed and he is in his own stocking. The patient's left leg is much better. The area on the posterior aspect of the left calf is fully epithelialized. His 3 small injuries which were wrap injuries on the left are improved only one seems still open his erythema has resolved 03/25/16; patient's right leg remains healed and he is in his own stocking. There is no open area today on  the left leg posterior leg is completely closed up. His wrap injuries at the superior aspect of his leg are also resolved. He looks as though he has some irritation on the dorsal ankle but this is fully epithelialized without evidence of infection. 03/28/16; we discharged this patient on Monday. Transitioned him into his own stocking. There were problems almost immediately with uncontrolled swelling weeping edema multiple some of which have opened. He does not feel systemically unwell in particular no chest pain no shortness of breath and he does not feel 04/08/16; the edema is under better control with the Profore light wrap but he still has pitting edema. There is one large wound anteriorly 2 on the medial aspect of his left leg and 3 small areas on the superior posterior calf. Drainage is not excessive he is tolerating a Profore light well  04/15/16; put a Profore wrap on him last week. This is controlled is edema however he had a lot of pain on his left anterior foot most of his wounds are healed 04/22/16 once again the patient has denuded areas on the left anterior foot which he states are because his wrap slips up word. He saw his primary physician today is on Lasix 40 twice a day and states that he his weight is down 20 pounds over the last 3 months. 04/29/16: Much improved. left anterior foot much improved. He is now on Lasix 80 mg per day. Much improved edema control 05/06/16; I was hoping to be able to discharge him today however once again he has blisters at a low level of where the compression was placed last week mostly on his left lateral but also his left medial leg and a small area on the anterior part of the left foot. 05/09/16; apparently the patient went home after his appointment on 7/4 later in the evening developing pain in his upper medial thigh together with subjective fever and chills although his temperature was not taken. The pain was so intense he felt he would probably have to call  911. However he then remembered that he had leftover doxycycline from a previous round of antibiotics and took these. By the next morning he felt a lot better. He called and spoke to one of our nurses and I approved doxycycline over the phone thinking that this was in relation to the wounds we had previously seen although they were definitely were not. The patient feels a lot better old fever no chills he is still working. Blood sugars are reasonably controlled 05/13/16; patient is back in for review of his cellulitis on his anterior medial upper thigh. He is taking doxycycline this is a lot better. Culture I did of the nodular area on the dorsal aspect of his foot grew MRSA this also looks a lot better. 05/20/16; the patient is cellulitis on the medial upper thigh has resolved. All of his wound areas including the left anterior foot, areas on the medial aspect of the left calf and the lateral aspect of the calf at all resolved. He has a new blister on the left dorsal foot at the level of the fourth toe this was excised. No evidence of infection 05/27/16; patient continues to complain weeping edema. He has new blisterlike wounds on the left anterior lateral and posterior lateral calf at the top of his wrap levels. The area on his left anterior foot appears better. He is not complaining of fever, pain or pruritus in his feet. 05/30/16; the patient's blisters on his left anterior leg posterior calf all look improved. He did not increase the Lasix 100 mg as I suggested because he was going to run out of his 40 mg tablets. He is still having weeping edema of his toes 06/03/16; I renewed his Lasix at 80 mg once a day as he was about to run out when I last saw him. He is on 80 mg of Lasix now. I have asked him to cut down on the excessive amount of water he was drinking and asked him to drink according to his thirst mechanisms 06/12/2016 -- was seen 2 days ago and was supposed to wear his compression stockings  at home but he is developed lymphedema and superficial blisters on the left lower extremity and hence came in for a review 06/24/16; the remaining wound is on his left anterior leg. He still  has edema coming from between his toes. There is lymphedema here however his edema is generally better than when I last saw this. He has a history of atrial fibrillation but does not have a known history of congestive heart failure nevertheless I think he probably has this at least on a diastolic basis. 07/01/16 I reviewed his echocardiogram from January 2017. This was essentially normal. He did not have LVH, EF of 55-60%. His right ventricular function was normal although he did have trivial tricuspid and pulmonic regurgitation. This is not audible on exam however. I increased his Lasix to do massive edema in his legs well above his knees I think in early July. He was also drinking an excessive amount of water at the time. 07/15/16; missed his appointment last week because of the Labor Day holiday on Monday. He could not get another appointment later in the week. Started to feel the wrap digging in superiorly so we remove the top half and the bottom half of his wrap. He has extensive erythema and blistering superiorly in the left leg. Very tender. Very swollen. Edema in his foot with leaking edema fluid. He has not been systemically unwell 07/22/16; the area on the left leg laterally required some debridement. The medial wounds look more stable. His wrap injury wounds appear to have healed. Edema and his foot is better, weeping edema is also better. He tells me he is meeting with the supplier of the external compression pumps at work 08/05/16; the patient was on vacation last week in [facility]. His wrap is been on for an extended period of time. Also over the weekend he developed an extensive area of tender erythema across his anterior medial thigh. He took to doxycycline yesterday that he had leftover from a  previous prescription. The patient complains of weeping edema coming out of his toes 08/08/16; I saw this patient on 10/2. He was tender across his anterior thigh. I put him on doxycycline. He returns today in follow-up. He does not have any open wounds on his lower leg, he still has edema weeping into his toes. 08/12/16; patient was seen back urgently today to follow-up for his extensive left thigh cellulitis/erysipelas. He comes back with a lot less swelling and erythema pain is much better. I believe I gave him Augmentin and Cipro. His wrap was cut down as he stated a roll down his legs. He developed blistering above the level of the wrap that remained. He has 2 open blisters and 1 intact. 08/19/16; patient is been doing his primary doctor who is increased his Lasix from 40-80 once a day or 80 already has less edema. Cellulitis has remained improved in the left thigh. 2 open areas on the posterior left calf 08/26/16; he returns today having new open blisters on the anterior part of his left leg. He has his compression pumps but is not yet been shown how to use some vital representative from the supplier. 09/02/16 patient returns today with no open wounds on the left leg. Some maceration in his plantar toes 09/10/2016 -- Dr. Leanord Hawking had recently discharged him on 09/02/2016 and he has come right back with redness swelling and some open ulcers on his left lower extremity. He says this was caused by trying to apply his compression stockings and he's been unable to use this and has not been able to use his lymphedema pumps. He had some doxycycline leftover and he has started on this a few days ago. 09/16/16; there are no  open wounds on his leg on the left and no evidence of cellulitis. 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 He does not complain of symptoms here. I am not clear use using his external compression pumps. 09/23/16; I have not seen  this patient in 2 weeks. He canceled his appointment 10 days ago as he was going on vacation. He tells me that on Monday he noticed a large area on his posterior left leg which is been draining copiously and is reopened into a large wound. He is been using ABDs and the external part of his juxtalite, according to our nurse this was not on properly. 10/07/16; Still a substantial area on the posterior left leg. Using silver alginate 10/14/16; in general better although there is still open area which looks healthy. Still using silver alginate. He reminds me that this happen before he left for [facility]. T oday while he was showering in the morning. He had been using his juxtalite's 10/21/16; the area on his posterior left leg is fully epithelialized. However he arrives today with a large area of tender erythema in his medial and posterior left thigh just above the knee. I have marked the area. Once again he is reluctant to consider hospitalization. I treated him with oral antibiotics in the past for a similar situation with resolution I think with doxycycline however this area it seems more extensive to me. He is not complaining of fever but does have chills and says states he is thirsty. His blood sugar today was in the 140s at home 10/25/16 the area on his posterior left leg is fully epithelialized although there is still some weeping edema. The large area of tenderness and erythema in his medial and posterior left thigh is a lot less tender although there is still a lot of swelling in this thigh. He states he feels a lot better. He is on doxycycline and Augmentin that I started last week. This will continued until Tuesday, December 26. I have ordered a duplex ultrasound of the left thigh rule out DVT whether there is an abscess something that would need to be drained I would also like to know. 11/01/16; he still has weeping edema from a not fully epithelialized area on his left posterior calf. Most  of the rest of this looks a lot better. He has completed his antibiotics. His thigh is a lot better. Duplex ultrasound did not show a DVT in the thigh 11/08/16; he comes in today with more Denuded surface epithelium from the posterior aspect of his calf. There is no real evidence of cellulitis. The superior aspect of his wrap appears to have put quite an indentation in his leg just below the knee and this may have contributed. He does not complain of pain or fever. We have been using silver alginate as the primary dressing. The area of cellulitis in the right thigh has totally resolved. He has been using his compression stockings once a week 11/15/16; the patient arrives today with more loss of epithelium from the posterior aspect of his left calf. He now has a fairly substantial wound in this area. The reason behind this deterioration isn't exactly clear although his edema is not well controlled. He states he feels he is generally more swollen systemically. He is not complaining of chest pain shortness of breath fever. T me he has an appointment with his primary physician in early February. He is on 80 mg of  oral ells Lasix a day. He claims compliance with the external compression pumps. He is not having any pain in his legs similar to what he has with his recurrent cellulitis 11/22/16; the patient arrives a follow-up of his large area on his left lateral calf. This looks somewhat better today. He came in earlier in the week for a dressing change since I saw him a week ago. He is not complaining of any pain no shortness of breath no chest pain 11/28/16; the patient arrives for follow-up of his large area on the left lateral calf this does not look better. In fact it is larger weeping edema. The surface of the wound does not look too bad. We have been using silver alginate although I'm not certain that this is a dressing issue. 12/05/16; again the patient follows up for a large wound on the left lateral and  left posterior calf this does not look better. There continues to be weeping edema necrotic surface tissue. More worrisome than this once again there is erythema below the wound involving the distal Achilles and heel suggestive of cellulitis. He is on his feet working most of the day of this is not going well. We are changing his dressing twice a week to facilitate the drainage. 12/12/16; not much change in the overall dimensions of the large area on the left posterior calf. This is very inflamed looking. I gave him an. Doxycycline last week does not really seem to have helped. He found the wrap very painful indeed it seems to of dog into his legs superiorly and perhaps around the heel. He came in early today because the drainage had soaked through his dressings. 12/19/16- patient arrives for follow-up evaluation of his left lower extremity ulcers. He states that he is using his lymphedema pumps once daily when there is "no drainage". He admits to not using his lipedema pumps while under current treatment. His blood sugars have been consistently between 150-200. 12/26/16; the patient is not using his compression pumps at home because of the wetness on his feet. I've advised him that I think it's important for him to use this daily. He finds his feet too wet, he can put a plastic bag over his legs while he is in the pumps. Otherwise I think will be in a vicious circle. We are using silver alginate to the major area on his left posterior calf 01/02/17; the patient's posterior left leg has further of all into 3 open wounds. All of them covered with a necrotic surface. He claims to be using his compression pumps once a day. His edema control is marginal. Continue with silver alginate 01/10/17; the patient's left posterior leg actually looks somewhat better. There is less edema, less erythema. Still has 3 open areas covered with a necrotic surface requiring debridement. He claims to be using his compression pumps  once a day his edema control is better 01/17/17; the patient's left posterior calf look better last week when I saw him and his wrap was changed 2 days ago. He has noted increasing pain in the left heel and arrives today with much larger wounds extensive erythema extending down into the entire heel area especially tender medially. He is not systemically unwell CBGs have been controlled no fever. Our intake nurse showed me limegreen drainage on his AVD pads. 01/24/17; his usual this patient responds nicely to antibiotics last week giving him Levaquin for presumed Pseudomonas. The whole entire posterior part of his leg is much better much  less inflamed and in the case of his Achilles heel area much less tender. He has also had some epithelialization posteriorly there are still open areas here and still draining but overall considerably better 01/31/17- He has continue to tolerate the compression wraps. he states that he continues to use the lymphedema pumps daily, and can increase to twice daily on the weekends. He is voicing no complaints or concerns regarding his LLE ulcers 02/07/17-he is here for follow-up evaluation. He states that he noted some erythema to the left medial and anterior thigh, which he states is new as of yesterday. He is concerned about recurrent cellulitis. He states his blood sugars have been slightly elevated, this morning in the 180s 02/14/17; he is here for follow-up evaluation. When he was last here there was erythema superiorly from his posterior wound in his anterior thigh. He was prescribed Levaquin however a culture of the wound surface grew MRSA over the phone I changed him to doxycycline on Monday and things seem to be a lot better. 02/24/17; patient missed his appointment on Friday therefore we changed his nurse visit into a physician visit today. Still using silver alginate on the large area of the posterior left thigh. He isn't new area on the dorsal left second  toe 03/03/17; actually better today although he admits he has not used his external compression pumps in the last 2 days or so because of work responsibilities over the weekend. 03/10/17; continued improvement. External compression pumps once a day almost all of his wounds have closed on the posterior left calf. Better edema control 03/17/17; in general improved. He still has 3 small open areas on the lateral aspect of his left leg however most of the area on the posterior part of his leg is epithelialized. He has better edema control. He has an ABD pad under his stocking on the right anterior lower leg although he did not let us look at that today. 03/24/17; patient arrives back in clinic today with no open areas however there are areas on the posterior left calf and anterior left calf that are less than 100% epithelialized. His edema is well controlled in the left lower leg. There is some pitting edema probably lymphedema in the left upper thigh. He uses compression pumps at home once per day. I tried to get him to do this twice a day although he is very reticent. 04/01/2017 -- for the last 2 days he's had significant redness, tenderness and weeping and came in for an urgent visit today. 04/07/17; patient still has 6 more days of doxycycline. He was seen by Dr. Meyer Russel last Wednesday for cellulitis involving the posterior aspect, lateral aspect of his Involving his heel. For the most part he is better there is less erythema and less weeping. He has been on his feet for 12 hours o2 over the weekend. Using his compression pumps once a day 04/14/17 arrives today with continued improvement. Only one area on the posterior left calf that is not fully epithelialized. He has intense bilateral venous inflammation associated with his chronic venous insufficiency disease and secondary lymphedema. We have been using silver alginate to the left posterior calf wound In passing he tells [place] today that the right leg but we  have not seen in quite some time has an open area on it but he doesn't want [place] to look at this today states he will show this to [place] next week. 04/21/17; there is no open area on his left leg although  he still reports some weeping edema. He showed [place] his right leg today which is the first time we've seen this leg in a long time. He has a large area of open wound on the right leg anteriorly healthy granulation. Quite a bit of swelling in the right leg and some degree of venous inflammation. He told us about the right leg in passing last week but states that deterioration in the right leg really only happened over the weekend 04/28/17; there is no open area on the left leg although there is an irritated part on the posterior which is like a wrap injury. The wound on the right leg which was new from last week at least to [place] is a lot better. 05/05/17; still no open area on the left leg. Patient is using his new compression stocking which seems to be doing a good job of controlling the edema. He states he is using his compression pumps once per day. The right leg still has an open wound although it is better in terms of surface area. Required debridement. A lot of pain in the posterior right Achilles marked tenderness. Usually this type of presentation this patient gives concern for an active cellulitis 05/12/17; patient arrives today with his major wound from last week on the right lateral leg somewhat better. Still requiring debridement. He was using his compression stocking on the left leg however that is reopened with superficial wounds anteriorly he did not have an open wound on this leg previously. He is still using his juxta light's once daily at night. He cannot find the time to do this in the morning as he has to be at work by 7 AM 05/19/17; right lateral leg wound looks improved. No debridement required. The concerning area is on the left posterior leg which appears to almost have a subcutaneous  hemorrhagic component to it. We've been using silver alginate to all the wounds 05/26/17; the right lateral leg wound continues to look improved. However the area on the left posterior calf is a tightly adherent surface. [place] using silver alginate. Because of the weeping edema in his legs there is very little good alternatives. 06/02/17; the patient left here last week looking quite good. Major wound on the left posterior calf and a small one on the right lateral calf. Both of these look satisfactory. He tells me that by Wednesday he had noted increased pain in the left leg and drainage. He called on Thursday and Friday to get an appointment here but we were blocked. He did not go to urgent care or his primary physician. He thinks he had a fever on Thursday but did not actually take his temperature. He has not been using his compression pumps on the left leg because of pain. I advised him to go to the emergency room today for IV antibiotics for stents of left leg cellulitis but he has refused I have asked him to take 2 days off work to keep his leg elevated and he has refused this as well. In view of this I'm going to call him and Augmentin and doxycycline. 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 However after a detailed discussion regarding the risk benefits and alternatives he refuses to get admitted to  the hospital. With no other recourse I will continue him on oral antibiotics as before and hopefully he'll have his infectious disease consultation this week. 06/16/2017 -- the patient was seen today by the nurse practitioner at infectious disease Ms. Dixon. Her review noted recurrent cellulitis of the lower extremity with tinea pedis of the left foot and she  has recommended clindamycin 150 mg daily for now and she may increase it to 300 mg daily to cover staph and Streptococcus. He has also been advise Lotrimin cream locally. she also had wise IV antibiotics for his condition if it flares up 06/23/17; patient arrives today with drainage bilaterally although the remaining wound on the left posterior calf after cleaning up today "highlighter yellow drainage" did not look too bad. Unfortunately he has had breakdown on the right anterior leg [previously this leg had not been open and he is using a black stocking] he went to see infectious disease and is been put on clindamycin 150 mg daily, I did not verify the dose although I'm not familiar with using clindamycin in this dosing range, perhaps for prophylaxisoo 06/27/17; I brought this patient back today to follow-up on the wound deterioration on the right lower leg together with surrounding cellulitis. I started him on doxycycline 4 days ago. This area looks better however he comes in today with intense cellulitis on the medial part of his left thigh. This is not have a wound in this area. Extremely tender. We've been using silver alginate to the wounds on the right lower leg left lower leg with bilateral 4 layer compression he is using his external compression pumps once a day 07/04/17; patient's left medial thigh cellulitis looks better. He has not been using his compression pumps as his insert said it was contraindicated with cellulitis. His right leg continues to make improvements all the wounds are still open. We only have one remaining wound on the left posterior calf. Using silver alginate to all open areas. He is on doxycycline which I started a week ago and should be finishing I gave him Augmentin after Thursday's visit for the severe cellulitis on the left medial thigh which fortunately looks better 07/14/17; the patient's left medial thigh cellulitis has resolved. The cellulitis in his right lower calf  on the right also looks better. All of his wounds are stable to improved we've been using silver alginate he has completed the antibiotics I have given him. He has clindamycin 150 mg once a day prescribed by infectious disease for prophylaxis, I've advised him to start this now. We have been using bilateral Unna boots over silver alginate to the wound areas 07/21/17; the patient is been to see infectious disease who noted his recurrent problems with cellulitis. He was not able to tolerate prophylactic clindamycin therefore he is on amoxicillin 500 twice a day. He also had a second daily dose of Lasix added By Dr. Oneta Rack but he is not taking this. Nor is he being completely compliant with his compression pumps a especially not this week. He has 2 remaining wounds one on the right posterior lateral lower leg and one on the left posterior medial lower leg. 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 The patient has Unna boots bilaterally. 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 He has  bilateral Unna boots for compression and silver alginate to his wounds. Arrives today with his legs looking as good as I have seen him in quite some time. Not surprisingly his wounds look better as well with improvement on the right lateral leg venous insufficiency wound and also the left medial leg. He is still using the compression pumps once a day 08/11/17; both legs appear to be doing better wounds on the right lateral and left medial legs look better. Skin on the right leg quite good. He is been using silver alginate as the primary dressing. I'm going to use Anasept gel calcium alginate and maintain all the secondary dressings 08/18/17; the patient continues to actually do quite well. The area on  his right lateral leg is just about closed the left medial also looks better although it is still moist in this area. His edema is well controlled we have been using Anasept gel with calcium alginate and the usual secondary dressings, 4 layer compression and once daily use of his compression pumps "always been able to manage 09/01/17; the patient continues to do reasonably well in spite of his trip to [place]. The area on the right lateral leg is epithelialized. Left is much better but still open. He has more edema and more chronic erythema on the left leg [venous inflammation] 09/08/17; he arrives today with no open wound on the right lateral leg and decently controlled edema. Unfortunately his left leg is not nearly as in his good situation as last week.he apparently had increasing edema starting on Saturday. He edema soaked through into his foot so used a plastic bag to walk around his home. The area on the medial right leg which was his open area is about the same however he has lost surface epithelium on the left lateral which is new and he has significant pain in the Achilles area of the left foot. He is already on amoxicillin chronically for prophylaxis of cellulitis in the left leg 09/15/17; he is completed a week of doxycycline and the cellulitis in the left posterior leg and Achilles area is as usual improved. He still has a lot of edema and fluid soaking through his dressings. There is no open wound on the right leg. He saw infectious disease NP today 09/22/17;As usual 1 we transition him from our compression wraps to his stockings things did not go well. He has several small open areas on the right leg. He states this was caused by the compression wrap on his skin although he did not wear this with the stockings over them. He has several superficial areas on the left leg medially laterally posteriorly. He does not have any evidence of active cellulitis especially involving the left  Achilles The patient is traveling from [facility] Saturday going to [facility]. He states he isn't attempting to get an appointment with a heel objects wound center there to change his dressings. I am not completely certain whether this will work 10/06/17; the patient came in on Friday for a nurse visit and the nurse reported that his legs actually look quite good. He arrives in clinic today for his regular follow-up visit. He has a new wound on his left third toe over the PIP probably caused by friction with his footwear. He has small areas on the left leg and a very superficial but epithelialized area on the right anterior lateral lower leg. Other than that his legs look as good as I've seen him in quite some time. We have  been using silver alginate Review of systems; no chest pain no shortness of breath other than this a 10 point review of systems negative 10/20/17; seen by Dr. Meyer Russel last week. He had taken some antibiotics [doxycycline] that he had left over. Dr. Meyer Russel thought he had candida infection and declined to give him further antibiotics. He has a small wound remaining on the right lateral leg several areas on the left leg including a larger area on the left posterior several left medial and anterior and a small wound on the left lateral. The area on the left dorsal third toe looks a lot better. ROS; Gen.; no fever, respiratory no cough no sputum Cardiac no chest pain other than this 10 point review of system is negative 10/30/17; patient arrives today having fallen in the bathtub 3 days ago. It took him a while to get up. He has pain and maceration in the wounds on his left leg which have deteriorated. He has not been using his pumps he also has some maceration on the right lateral leg. 11/03/17; patient continues to have weeping edema especially in the left leg. This saturates his dressings which were just put on on 12/27. As usual the doxycycline seems to take care of the cellulitis  on his lower leg. He is not complaining of fever, chills, or other systemic symptoms. He states his leg feels a lot better on the doxycycline I gave him empirically. He also apparently gets injections at his primary doctor's officeo Rocephin for cellulitis prophylaxis. I didn't ask him about his compression pump compliance today I think that's probably marginal. Arrives in the clinic with all of his dressings primary and secondary macerated full of fluid and he has bilateral edema 11/10/17; the patient's right leg looks some better although there is still a cluster of wounds on the right lateral. The left leg is inflamed with almost circumferential skin loss medially to laterally although we are still maintaining anteriorly. He does not have overt cellulitis there is a lot of drainage. He is not using compression pumps. We have been using silver alginate to the wound areas, there are not a lot of options here 11/17/17; the patient's right leg continues to be stable although there is still open wounds, better than last week. The inflammation in the left leg is better. Still loss of surface layer epithelium especially posteriorly. There is no overt cellulitis in the amount of edema and his left leg is really quite good, tells me he is using his compression pumps once a day. 11/24/17; patient's right leg has a small superficial wound laterally this continues to improve. The inflammation in the left leg is still improving however we have continuous surface layer epithelial loss posteriorly. There is no overt cellulitis in the amount of edema in both legs is really quite good. He states he is using his compression pumps on the left leg once a day for 5 out of 7 days 12/01/17; very small superficial areas on the right lateral leg continue to improve. Edema control in both legs is better today. He has continued loss of surface epithelialization and left posterior calf although I think this is better. We have been  using silver alginate with large number of absorptive secondary dressings 4 layer on the left Unna boot on the right at his request. He tells me he is using his compression pumps once a day 12/08/17; he has no open area on the right leg is edema control is good here. ooOn the  left leg however he has marked erythema and tenderness breakdown of skin. He has what appears to be a wrap injury just distal to the popliteal fossa. 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 There was swelling erythema and breakdown in the right leg. He has much worse skin breakdown in the left leg as well multiple open areas medially and posteriorly as well as laterally. He tells me he has been using his compression pumps but tells me he feels that the drainage out of his leg is worse when he uses a compression pumps. T be fair to him he is been saying this o for a while however I don't know that I have really been listening to this. I wonder if the compression pumps are working properly 12/22/17;. Once again he arrives with severe erythema, weeping edema from the left greater than right leg. Noncompliance with compression pumps. New this visit he is complaining of pain on the lateral aspect of the right leg and the medial aspect of his right thigh. He apparently saw his cardiologist Dr. [name] who was ordered an echocardiogram area and I think this is a step in the right direction 12/25/17; started his doxycycline Monday night. There is still intense erythema of the right leg especially in the anterior thigh although there is less tenderness. The erythema around the wound on the right lateral calf also is less tender. He still complaining of pain in the  left heel. His wounds are about the same right lateral left medial left lateral. Superficial but certainly not close to closure. He denies being systemically unwell no fever chills no abdominal pain no diarrhea 12/29/17; back in follow-up of his extensive right calf and right thigh cellulitis. I added amoxicillin to cover possible doxycycline resistant strep. This seems to of done the trick he is in much less pain there is much less erythema and swelling. He has his echocardiogram at 11:00 this morning. X-ray of the left heel was also negative. 01/05/18; the patient arrived with his edema under much better control. Now that he is retired he is able to use his compression pumps daily and sometimes twice a day per the patient. He has a wound on the right leg the lateral wound looks better. Area on the left leg also looks a lot better. He has no evidence of cellulitis in his bilateral thighs I had a quick peak at his echocardiogram. He is in normal ejection fraction and normal left ventricular function. He has moderate pulmonary hypertension moderately reduced right ventricular function. One would have to wonder about chronic sleep apnea although he says he doesn't snore. He'll review the echocardiogram with his cardiologist. 01/12/18; the patient arrives with the edema in both legs under exemplary control. He is using his compression pumps daily and sometimes twice daily. His wound on the right lateral leg is just about closed. He still has some weeping areas on the posterior left calf and lateral left calf although everything is just about closed here as well. I have spoken with Aldean Baker who is the patient's nurse practitioner and infectious disease. She was concerned that the patient had not understood that the parenteral penicillin injections he was receiving for cellulitis prophylaxis was actually benefiting  him. I don't think the patient actually saw that I would tend to agree we were  certainly dealing with less infections although he had a serious one last month. 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 He is healed. He'll be placed in juxtalite compression wraps and increase his lymphedema pumps to twice daily. We will follow up again next week to ensure there are no issues with the new regiment. 01/20/18-he is here for evaluation of bilateral lower extremity weeping edema. Yesterday he was placed in compression wrap to the right lower extremity and compression stocking to left lower shrubbery. He states he uses lymphedema pumps last night and again this morning and noted a blister to the left lower extremity. On exam he was noted to have drainage to the right lower extremity. He will be placed in Unna boots bilaterally and follow-up next week 01/26/18; patient was actually discharged a week ago to his own juxta light stockings only to return the next day with bilateral lower extremity weeping edema.he was placed in bilateral Unna boots. He arrives today with pain in the back of his left leg. There is no open area on the right leg however there is a linear/wrap injury on the left leg and weeping edema on the left leg posteriorly. I spoke with infectious disease about 10 days ago. They were disappointed that the patient elected to discontinue prophylactic intramuscular penicillin shots as they felt it was particularly beneficial in reducing the frequency of his cellulitis. I discussed this with the patient today. He does not share this view. He'll definitely need antibiotics today. Finally he is traveling to [place] and trauma leaving this Saturday and returning a week later and he does not travel with his pumps. He is going by car 01/30/18; patient was seen 4 days ago and brought back in today for review of cellulitis in the left leg posteriorly. I put him on amoxicillin this really hasn't helped as much as I might like. He is also worried because  he is traveling to [facility] trauma by car. Finally we will be rewrapping him. There is no open area on the right leg over his left leg has multiple weeping areas as usual 02/09/18; The same wrap on for 10 days. He did not pick up the last doxycycline I prescribed for him. He apparently took 4 days worth he already had. There is nothing open on his right leg and the edema control is really quite good. He's had damage in the left leg medially and laterally especially probably related to the prolonged use of Unna boots 02/12/18; the patient arrived in clinic today for a nurse visit/wrap change. He complained of a lot of pain in the left posterior calf. He is taking doxycycline that I previously prescribed for him. Unfortunately even though he used his stockings and apparently used to compression pumps twice a day he has weeping edema coming out of the lateral part of his right leg. This is coming from the lower anterior lateral skin area. 02/16/18; the patient has finished his doxycycline and will finish the amoxicillin 2 days. The area of cellulitis in the left calf posteriorly has resolved. He is no longer having any pain. He tells me he is using his compression pumps at least once a day sometimes twice. 02/23/18; the patient finished his doxycycline and Amoxil last week. On Friday he noticed a small erythematous circle about the size of a quarter on the left lower leg just above  his ankle. This rapidly expanded and he now has erythema on the lateral and posterior part of the thigh. This is bright red. Also has an area on the dorsal foot just above his toes and a tender area just below the left popliteal fossa. He came off his prophylactic penicillin injections at his own insistence one or 2 months ago. This is obviously deteriorated since then 03/02/18; patient is on doxycycline and Amoxil. Culture I did last week of the weeping area on the back of his left calf grew group B strep. I have therefore renewed  the amoxicillin 500 3 times a day for a further week. He has not been systemically unwell. Still complaining of an area of discomfort right under his left popliteal fossa. There is no open wound on the right leg. He tells me that he is using his pumps twice a day on most days 03/09/18; patient arrives in clinic today completing his amoxicillin today. The cellulitis on his left leg is better. Furthermore he tells me that he had intramuscular penicillin shots that his primary care office today. However he also states that the wrap on his right leg fell down shortly after leaving clinic last week. He developed a large blister that was present when he came in for a nurse visit later in the week and then he developed intense discomfort around this area.He tells me he is using his compression pumps 03/16/18; the patient has completed his doxycycline. The infectious part of this/cellulitis in the left heel area left popliteal area is a lot better. He has 2 open areas on the right calf. Still areas on the left calf but this is a lot better as well. 03/24/18; the patient arrives complaining of pain in the left popliteal area again. He thinks some of this is wrap injury. He has no open area on the right leg and really no open area on the left calf either except for the popliteal area. He claims to be compliant with the compression pumps 03/31/18; I gave him doxycycline last week because of cellulitis in the left popliteal area. This is a lot better although the surface epithelium is denuded off and response to this. He arrives today with uncontrolled edema in the right calf area as well as a fingernail injury in the right lateral calf. There is only a few open areas on the left 04/06/18; I gave him amoxicillin doxycycline over the last 2 weeks that the amoxicillin should be completing currently. He is not complaining of any pain or systemic symptoms. The only open areas see has is on the right lateral lower leg  paradoxically I cannot see anything on the left lower leg. He tells me he is using his compression pumps twice a day on most days. Silver alginate to the wounds that are open under 4 layer compression 04/13/18; he completed antibiotics and has no new complaints. Using his compression pumps. Silver alginate that anything that's opened 04/20/18; he is using his compression pumps religiously. Silver alginate 4 layer compression anything that's opened. He comes in today with no open wounds on the left leg but 3 on the right including a new one posteriorly. He has 2 on the right lateral and one on the right posterior. He likes Unna boots on the right leg for reasons that aren't really clear we had the usual 4 layer compression on the left. It may be necessary to move to the 4 layer compression on the right however for now I left them  in the Unna boots 04/27/18; he is using his compression pumps at least once a day. He has still the wounds on the right lateral calf. The area right posteriorly has closed. He does not have an open wound on the left under 4 layer compression however on the dorsal left foot just proximal to the toes and the left third toe 2 small open areas were identified 05/11/18; he has not uses compression pumps. The areas on the right lateral calf have coalesced into one large wound necrotic surface. On the left side he has one small wound anteriorly however the edema is now weeping out of a large part of his left leg. He says he wasn't using his pumps because of the weeping fluid. I explained to him that this is the time he needs to pump more 05/18/18; patient states he is using his compression pumps twice a day. The area on the right lateral large wound albeit superficial. On the left side he has innumerable number of small new wounds on the left calf particularly laterally but several anteriorly and medially. All these appear to have healthy granulated base these look like the remnants of  blisters however they occurred under compression. The patient arrives in clinic today with his legs somewhat better. There is certainly less edema, less multiple open areas on the left calf and the right anterior leg looks somewhat better as well superficial and a little smaller. However he relates pain and erythema over the last 3-4 days in the thigh and I looked at this today. He has not been systemically unwell no fever no chills no change in blood sugar values 05/25/18; comes in today in a better state. The severe cellulitis on his left leg seems better with the Keflex. Not as tender. He has not been systemically unwell ooHard to find an open wound on the left lower leg using his compression pumps twice a day ooThe confluent wounds on his right lateral calf somewhat better looking. These will ultimately need debridement I didn't do this today. 06/01/18; the severe cellulitis on the left anterior thigh has resolved and he is completed his Keflex. ooThere is no open wound on the left leg however there is a superficial excoriation at the base of the third toe dorsally. Skin on the bottom of his left foot is macerated looking. ooThe left the wounds on the lateral right leg actually looks some better although he did require debridement of the top half of this wound area with an open curet 06/09/18 on evaluation today patient appears to be doing poorly in regard to his right lower extremity in particular this appears to likely be infected he has very thick purulent discharge along with a bright green tent to the discharge. This makes me concerned about the possibility of pseudomonas. He's also having increased discomfort at this point on evaluation. Fortunately there does not appear to be any evidence of infection spreading to the other location at this time. 06/16/18 on evaluation today patient appears to actually be doing fairly well. His ulcer has actually diminished in size quite significantly at this  point which is good news. Nonetheless he still does have some evidence of infection he did see infectious disease this morning before coming here for his appointment. I did review the results of their evaluation and their note today. They did actually have him discontinue the Cipro and initiate treatment with linezolid at this time. He is doing this for the next seven days and they recommended  a follow-up in four months with them. He is the keep a log of the need for intermittent antibiotic therapy between now and when he falls back up with infectious disease. This will help them gaze what exactly they need to do to try and help them out. 06/23/18; the patient arrives today with no open wounds on the left leg and left third toe healed. He is been using his compression pumps twice a day. On the right lateral leg he still has a sizable wound but this is a lot better than last time I saw this. 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 Has been using silver alginate under 4 layer compression 06/30/18; the only open wound he has is on the right lateral leg and this looks healthy. No debridement is required. We have been using silver alginate. He does not have an open wound on the left leg. There is apparently some drainage from the dorsal proximal third toe on the left although I see no open wound here. 07/03/18 on evaluation today patient was actually here just for a nurse visit rapid change. However when he was here on Wednesday for his rat change due to having been healed on the left and then developing blisters we initiated the wrap again knowing that he would be back today for [place] to reevaluate and see were at. Unfortunately he has developed some cellulitis into the proximal portion of his right lower extremity even into the region of his thigh. He did test positive for MRSA on the last culture which  was reported back on 06/23/18. He was placed on one as what at that point. Nonetheless he is done with that and has been tolerating it well otherwise. Doxycycline which in the past really did not seem to be effective for him. Nonetheless I think the best option may be for [place] to definitely reinitiate the antibiotics for a longer period of time. 07/07/18; since I last saw this patient a week ago he has had a difficult time. At that point he did not have an open wound on his left leg. We transitioned him into juxta light stockings. He was apparently in the clinic the next day with blisters on the left lateral and left medial lower calf. He also had weeping edema fluid. He was put back into a compression wrap. He was also in the clinic on Friday with intense erythema in his right thigh. Per the patient he was started on Bactrim however that didn't work at all in terms of relieving his pain and swelling. He has taken 3 doxycycline that he had left over from last time and that seems to of helped. He has blistering on the right thigh as well. 07/14/18; the erythema on his right thigh has gotten better with doxycycline that he is finishing. 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 Presumably this cellulitis in the thigh was not related to that although I think this is a bit concerning going forward. He still has an area on the right lateral calf the blister on the right medial calf just below the knee that was discussed above. On the left 2 small open areas left medial and left lateral. Edema control is adequate. He is using his compression pumps twice a day 07/20/18; continued improvement in the condition of both  legs especially the edema in his bilateral thighs. He tells me he is been losing weight through a combination of diet and exercise. He is using his compression pumps twice a day. So overall she made to the remaining  wounds 07/27/2018; continued improvement in condition of both legs. His edema is well controlled. The area on the right lateral leg is just about closed he had one blisters show up on the medial left upper calf. We have him in 4 layer compression. He is going on a 10-day trip to [place], [place]. He will be driving. He wants to wear Unna boots because of the lessening amount of constriction. He will not use compression pumps while he is away 08/05/18 on evaluation today patient actually appears to be doing decently well all things considered in regard to his bilateral lower extremities. The worst ulcer is actually only posterior aspect of his left lower extremity with a four layer compression wrap cut into his leg a couple weeks back. He did have a trip and actually had [employer] for the trip that he is worn since he was last here. Nonetheless he feels like the [employer] actually do better for him his swelling is up a little bit but he also with his trip was not taking his Lasix on a regular set schedule like he was supposed to be. He states that obviously the reason being that he cannot drive and keep going without having to urinate too frequently which makes it difficult. He did not have his pumps with him while he was away either which I think also maybe playing a role here too. 08/13/2018; the patient only has a small open wound on the right lateral calf which is a big improvement in the last month or 2. He also has the area posteriorly just below the posterior fossa on the left which I think was a wrap injury from several weeks ago. He has no current evidence of cellulitis. He tells me he is back into his compression pumps twice a day. He also tells me that while he was at the laundromat somebody stole a section of his extremitease stockings 08/20/2018; back in the clinic with a much improved state. He only has small areas on the right lateral mid calf which is just  about healed. This was is more substantial area for quite a prolonged period of time. He has a small open area on the left anterior tibia. The area on the posterior calf just below the popliteal fossa is closed today. He is using his compression pumps twice a day 08/28/2018; patient has no open wound on the right leg. He has a smattering of open areas on the calf with some weeping lymphedema. More problematically than that it looks as though his wraps of slipped down in his usual he has very angry upper area of edema just below the right medial knee and on the right lateral calf. He has no open area on his feet. The patient is traveling to [facility] next week. I will send him in an antibiotic. We will continue to wrap the right leg. We ordered extremitease stockings for him last week and I plan to transition the right leg to a stocking when he gets home which will be in 10 days time. As usual he is very reluctant to take his pumps with him when he travels 09/07/2018; patient returns from [facility]. He shows me a picture of his left  leg in the mid part of his trip last week with intense fire engine erythema. The picture look bad enough I would have considered sending him to the hospital. Instead he went to the wound care center in [facility]. They did not prescribe him antibiotics but he did take some doxycycline he had leftover from a previous visit. I had given him trimethoprim sulfamethoxazole before he left this did not work according to the patient. This is resulted in some improvement fortunately. He comes back with a large wound on the left posterior calf. Smaller area on the left anterior tibia. Denuded blisters on the dorsal left foot over his toes. Does not have much in the way of wounds on the right leg although he does have a very tender area on the right posterior area just below the popliteal fossa also suggestive of infection. He promises me he is back on his pumps twice  a day 09/15/2018; the intense cellulitis in his left lower calf is a lot better. The wound area on the posterior left calf is also so better. However he has reasonably extensive wounds on the dorsal aspect of his second and third toes and the proximal foot just at the base of the toes. There is nothing open on the right leg 09/22/2018; the patient has excellent edema control in his legs bilaterally. He is using his external compression pumps twice a day. He has no open area on the right leg and only the areas in the left foot dorsally second and third toe area on the left side. He does not have any signs of active cellulitis. 10/06/2018; the patient has good edema control bilaterally. He has no open wound on the right leg. There is a blister in the posterior aspect of his left calf that we had to deal with today. He is using his compression pumps twice a day. There is no signs of active cellulitis. We have been using silver alginate to the wound areas. He still has vulnerable areas on the base of his left first second toes dorsally He has a his extremities stockings and we are going to transition him today into the stocking on the right leg. He is cautioned that he will need to continue to use the compression pumps twice a day. If he notices uncontrolled edema in the right leg he may need to go to 3 times a day. 10/13/2018; the patient came in for a nurse check on Friday he has a large flaccid blister on the right medial calf just below the knee. We unroofed this. He has this and a new area underneath the posterior mid calf which was undoubtedly a blister as well. He also has several small areas on the right which is the area we put his extremities stocking on. 10/19/2018; the patient went to see infectious disease this morning I am not sure if that was a routine follow-up in any case the doxycycline I had given him was discontinued and started on linezolid. He has not started this. It is easy to look  at his left calf and the inflammation and think this is cellulitis however he is very tender in the tissue just below the popliteal fossa and I have no doubt that there is infection going on here. He states the problem he is having is that with the compression pumps the edema goes down and then starts walking the wrap falls down. We will see if we can adhere this. He has 1 or 2  minuscule open areas on the right still areas that are weeping on the posterior left calf, the base of his left second and third toes 10/26/18; back today in clinic with quite of skin breakdown in his left anterior leg. This may have been infection the area below the popliteal fossa seems a lot better however tremendous epithelial loss on the left anterior mid tibia area over quite inexpensive tissue. He has 2 blisters on the right side but no other open wound here. 10/29/2018; came in urgently to see [place] today and we worked him in for review. He states that the 4 layer compression on the right leg caused pain he had to cut it down to roughly his mid calf this caused swelling above the wrap and he has blisters and skin breakdown today. As a result of the pain he has not been using his pumps. Both legs are a lot more edematous and there is a lot of weeping fluid. 11/02/18; arrives in clinic with continued difficulties in the right leg> left. Leg is swollen and painful. multiple skin blisters and new open areas especially laterally. He has not been using his pumps on the right leg. 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 He is not systemically unwell. 11/09/2018; the patient claims he is being compliant with his pumps. He is finished the doxycycline I gave him last week. Culture I did of the wound on the right lateral leg showed a few very resistant methicillin staph aureus. This was resistant to doxycycline. Nevertheless he states the pain in the leg is a lot better which makes me  wonder if the cultured organism was not really what was causing the problem nevertheless this is a very dangerous organism to be culturing out of any wound. His right leg is still a lot larger than the left. He is using an Radio broadcast assistant on this area, he blames a 4-layer compression for causing the original skin breakdown which I doubt is true however I cannot talk him out of it. We have been using silver alginate to all of these areas which were initially blisters 11/16/2018; patient is being compliant with his external compression pumps at twice a day. Miraculously he arrives in clinic today with absolutely no open wounds. He has better edema control on the left where he has been using 4 layer compression versus wound of wounds on the right and I pointed this out to him. There is no inflammation in the skin in his lower legs which is also somewhat unusual for him. There is no open wounds on the dorsal left foot. He has extremitease stockings at home and I have asked him to bring these in next week. 11/25/18 patient's lower extremity on examination today on the left appears for the most part to be wound free. He does have an open wound on the lateral aspect of the right lower extremity but this is minimal compared to what I've seen in past. He does request that we go ahead and wrap the left leg as well even though there's nothing open just so hopefully it will not reopen in short order. 1/28; patient has superficial open wounds on the right lateral calf left anterior calf and left posterior calf. His edema control is adequate. 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 We have been using silver alginate as the primary dressing. He claims compliance  with his compression pumps 2/4; patient has superficial open wounds on numerous areas of his left calf and again one on the left dorsal foot. The areas on the right lateral calf  have healed. The cellulitis that I gave him doxycycline for last week is also resolved this was mostly on the left anterior calf just below the tibial tuberosity. His edema looks fairly well-controlled. He tells me he went to see his primary doctor today and had blood work ordered 2/11; once again he has several open areas on the left calf left tibial area. Most of these are small and appear to have healthy granulation. He does not have anything open on the right. The edema and control in his thighs is pretty good which is usually a good indication he has been using his pumps as requested. 2/18; he continues to have several small areas on the left calf and left tibial area. Most of these are small healthy granulation. We put him in his stocking on the right leg last week and he arrives with a superficial open area over the right upper tibia and a fairly large area on the right lateral tibia in similar condition. His edema control actually does not look too bad, he claims to be using his compression pumps twice a day 2/25. Continued small areas on the left calf and left tibial area. New areas especially on the right are identified just below the tibial tuberosity and on the right upper tibia itself. There are also areas of weeping edema fluid even without an obvious wound. He does not have a considerable degree of lymphedema but clearly there is more edema here than his skin can handle. He states he is using the pumps twice a day. We have an Unna boot on the right and 4 layer compression on the left. 3/3; he continues to have an area on the right lateral calf and right posterior calf just below the popliteal fossa. There is a fair amount of tenderness around the wound on the popliteal fossa but I did not see any evidence of cellulitis, could just be that the wrap came down and rubbed in this area. ooHe does not have an open area on the left leg however there is an area on the left dorsal foot at the  base of the third toe ooWe have been using silver alginate to all wound areas 3/10; he did not have an open area on his left leg last time he was here a week ago. T oday he arrives with a horizontal wound just below the tibial tuberosity and an area on the left lateral calf. He has intense erythema and tenderness in this area. The area is on the right lateral calf and right posterior calf better than last week. We have been using silver alginate as usual 3/18 - Patient returns with 3 small open areas on left calf, and 1 small open area on right calf, the skin looks ok with no significant erythema, he continues the UNA boot on right and 4 layer compression on left. The right lateral calf wound is closed , the right posterior is small area. we will continue silver alginate to the areas. 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 He is been tolerating the dressing changes without complication. Fortunately she has made excellent improvement  in regard to the overall status of his wounds. Unfortunately every time we cease wrapping him he ends up reopening in causing more significant issues at that point. Again I'm unsure of the best direction to take although I think the lymphedema clinic may be appropriate for him. 02/03/19 on evaluation today patient appears to be doing well in regard to the wounds that we saw him for last week unfortunately he has a new area on the proximal portion of his right medial/posterior lower extremity where the wrap somewhat slowed down and caused swelling and a blister to rub and open. Unfortunately this is the only opening that he has on either leg at this point. 02/17/19 on evaluation today patient's bilateral lower extremities appear to be doing well. He still completely healed in  regard to the left lower extremity. In regard to the right lower extremity the area where the wrap and slid down and caused the blister still seems to be slightly open although this is dramatically better than during the last evaluation two weeks ago. I'm very pleased with the way this stands overall. 03/03/19 on evaluation today patient appears to be doing well in regard to his right lower extremity in general although he did have a new blister open this does not appear to be showing any evidence of active infection at this time. Fortunately there's No fevers, chills, nausea, or vomiting noted at this time. Overall I feel like he is making good progress it does feel like that the right leg will we perform the [employer] seems to do with a bit better than three layer wrap on the left which slid down on him. We may switch to doing bilateral in the book wraps. 5/4; I have not seen Mr. Cu in quite some time. According to our case manager he did not have an open wound on his left leg last week. He had 1 remaining wound on the right posterior medial calf. He arrives today with multiple openings on the left leg probably were blisters and/or wrap injuries from Unna boots. I do not think the Unna boot's will provide adequate compression on the left. I am also not clear about the frequency he is using the compression pumps. 03/17/19 on evaluation today patient appears to be doing excellent in regard to his lower extremities compared to last week's evaluation apparently. He had gotten significantly worse last week which is unfortunate. The [employer] wrap on the left did not seem to do very well for him at all and in fact it didn't control his swelling significantly enough he had an additional outbreak. Subsequently we go back to the four layer compression wrap on the left. This is good news. At least in that he is doing better and the wound seem to be killing him. He still has not heard anything from the  lymphedema clinic. 03/24/19 on evaluation today patient actually appears to be doing much better in regard to his bilateral lower Trinity as compared to last week when I saw him. Fortunately there's no signs of active infection at this time. He has been tolerating the dressing changes without complication. Overall I'm extremely pleased with the progress and appearance in general. 04/07/19 on evaluation today patient appears to be doing well in regard to his bilateral lower extremities. His swelling is significantly down from where it was previous. With that being said he does have a couple blisters still open at this point but fortunately nothing that seems to be too severe  and again the majority of the larger openings has healed at this time. 04/14/19 on evaluation today patient actually appears to be doing quite well in regard to his bilateral lower extremities in fact I'm not even sure there's anything significantly open at this time at any site. Nonetheless he did have some trouble with these wraps where they are somewhat irritating him secondary to the fact that he has noted that the graph wasn't too close down to the end of this foot in a little bit short as well up to his knee. Otherwise things seem to be doing quite well. 04/21/19 upon evaluation today patient's wound bed actually showed evidence of being completely healed in regard to both lower extremities which is excellent news. There does not appear to be any signs of active infection which is also good news. I'm very pleased in this regard. No fevers, chills, nausea, or vomiting noted at this time. 04/28/19 on evaluation today patient appears to be doing a little bit worse in regard to both lower extremities on the left mainly due to the fact that when he went infection disease the wrap was not wrapped quite high enough he developed a blister above this. On the right he is a small open area of nothing too significant but again this is  continuing to give him some trouble he has been were in the Velcro compression that he has at home. 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 He's been tolerating the dressing changes without complication. Fortunately there's no signs of active infection at this time. No fevers, chills, nausea, or vomiting noted at this time. We have been trying to get an appointment with her lymphedema clinic in [facility] but unfortunately nobody can get them on phone with not been able to even fax information over the patient likewise is not been able to get in touch with them. Overall I'm not sure exactly what's going on here with to reach out again today. 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 Still having a lot of drainage unfortunately. He tells me especially in the left but even on the right. There's no signs of active infection which is good news we've been using so ratcheted up to this point. 05/19/19 on evaluation today patient actually appears to be doing quite well with regard to his left lower extremity which is great news. Fortunately in regard to the right lower extremity has an issues with his wrap and he subsequently did remove this from what I'm understanding. Nonetheless long story short is what he had rewrapped once he removed it subsequently had maggots underneath this wrap whenever he came in for evaluation today. With that being said they were obviously completely cleaned away by the nursing staff. The visit today which is excellent news. However he does appear to potentially have some infection around the right ankle region where the maggots were located as well. He will likely require anabiotic therapy today. 05/26/19 on evaluation today patient actually appears to be doing much better in regard to his bilateral lower extremities.  I feel like the infection is under much better control. With that being said there were maggots noted when the wrap was removed yet again today. Again this could have potentially been left over from previous although at this time there does not appear to be any signs of significant  drainage there was obviously on the wrap some drainage as well this contracted gnats or otherwise. Either way I do not see anything that appears to be doing worse in my pinion and in fact I think his drainage has slowed down quite significantly likely mainly due to the fact to his infection being under better control. 06/02/2019 on evaluation today patient actually appears to be doing well with regard to his bilateral lower extremities there is no signs of active infection at this time which is great news. With that being said he does have several open areas more so on the right than the left but nonetheless these are all significantly better than previously noted. 06/09/2019 on evaluation today patient actually appears to be doing well. His wrap stayed up and he did not cause any problems he had more drainage on the right compared to the left but overall I do not see any major issues at this time which is great news. 06/16/2019 on evaluation today patient appears to be doing excellent with regard to his lower extremities the only area that is open is a new blister that can have opened as of today on the medial ankle on the left. Other than this he really seems to be doing great I see no major issues at this point. 06/23/2019 on evaluation today patient appears to be doing quite well with regard to his bilateral lower extremities. In fact he actually appears to be almost completely healed there is a small area of weeping noted of the right lower extremity just above the ankle. Nonetheless fortunately there is no signs of active infection at this time which is good news. No fevers, chills, nausea, vomiting, or diarrhea. 8/24; the  patient arrived for a nurse visit today but complained of very significant pain in the left leg and therefore I was asked to look at this. Noted that he did not have an open area on the left leg last week nevertheless this was wrapped. The patient states that he is not been able to put his compression pumps on the left leg because of the discomfort. He has not been systemically unwell 06/30/2019 on evaluation today patient unfortunately despite being excellent last week is doing much worse with regard to his left lower extremity today. In fact he had to come in for a nurse on Monday where his left leg had to be rewrapped due to excessive weeping Dr. Leanord Hawking placed him on doxycycline at that point. Fortunately there is no signs of active infection Systemically at this time which is good news. 07/07/2019 in regard to the patient's wounds today he actually seems to be doing well with his right lower extremity there really is nothing open or draining at this point this is great news. Unfortunately the left lower extremity is given him additional trouble at this time. There does not appear to be any signs of active infection nonetheless he does have a lot of edema and swelling noted at this point as well as blistering all of which has led to a much more poor appearing leg at this time compared to where it was 2 weeks ago when it was almost completely healed. Obviously this is a little discouraging for the patient. He is try to contact the lymphedema clinic in [place] he has not been able to get through to them. 07/14/2019 on evaluation today patient actually appears to be doing slightly better with regard to his left lower extremity ulcers. Overall I do feel like at  least at the top of the wrap that we have been placing this area has healed quite nicely and looks much better. The remainder of the leg is showing signs of improvement. Unfortunately in the thigh area he still has an open region on the left and  again on the right he has been utilizing just a Band-Aid on an area that also opened on the thigh. Again this is an area that were not able to wrap although we did do an Ace wrap to provide some compression that something that obviously is a little less effective than the compression wraps we have been using on the lower portion of the leg. He does have an appointment with the lymphedema clinic in [facility] on Friday. 07/21/2019 on evaluation today patient appears to be doing better with regard to his lower extremity ulcers. He has been tolerating the dressing changes without complication. Fortunately there is no signs of active infection at this time. No fevers, chills, nausea, vomiting, or diarrhea. I did receive the paperwork from the physical therapist at the lymphedema clinic in [place]. Subsequently I signed off on that this morning and sent that back to him for further progression with the treatment plan. 07/28/2019 on evaluation today patient appears to be doing very well with regard to his right lower extremity where I do not see any open wounds at this point. Fortunately he is feeling great as far as that is concerned as well. In regard to the left lower extremity he has been having issues with still several areas of weeping and edema although the upper leg is doing better his lower leg still I think is going require the compression wrap at this time. No fevers, chills, nausea, vomiting, or diarrhea. 08/04/2019 on evaluation today patient unfortunately is having new wounds on the right lower extremity. Again we have been using Unna boot wrap on that side. We switched him to using his juxta lite wrap at home. With that being said he tells me he has been using it although his legs extremely swollen and to be honest really does not appear that he has been. I cannot know that for sure however. Nonetheless he has multiple new wounds on the right lower extremity at this time. Obviously we  will have to see about getting this rewrapped for him today. 08/11/2019 on evaluation today patient appears to be doing fairly well with regard to his wounds. He has been tolerating the dressing changes including the compression wraps without complication. He still has a lot of edema in his upper thigh regions bilaterally he is supposed to be seeing the lymphedema clinic on the 15th of this month once his wraps arrive for the upper part of his legs. 08/18/2019 on evaluation today patient appears to be doing well with regard to his bilateral lower extremities at this point. He has been tolerating the dressing changes without complication. Fortunately there is no signs of active infection which is also good news. He does have a couple weeping areas on the first and second toe of the right foot he also has just a small area on the left foot upper leg and a small area on the left lower leg but overall he is doing quite well in my opinion. He is supposed to be getting his wraps shortly in fact tomorrow and then subsequently is seeing the lymphedema clinic next Wednesday on the 21st. Of note he is also leaving on the 25th to go on vacation for a [place] to  the [place]. For that reason and since there is some uncertainty about what there can be doing at lymphedema clinic next Wednesday I am get a make an appointment for next Friday here for [place] to see what we need to do for him prior to him leaving for vacation. 10/23; patient arrives in considerable pain predominantly in the upper posterior calf just distal to the popliteal fossa also in the wound anteriorly above the major wound. This is probably cellulitis and he has had this recurrently in the past. He has no open wound on the right side and he has had an Radio broadcast assistant in that area. Finally I note that he has an area on the left posterior calf which by enlarge is mostly epithelialized. This protrudes beyond the borders of the surrounding skin in the setting of dry  scaly skin and lymphedema. The patient is leaving for [facility] on Sunday. Per his longstanding pattern, he will not take his compression pumps with him predominantly out of fear that they will be stolen. He therefore asked that we put a Unna boot back on the right leg. He will also contact the wound care center in [facility] to see if they can change his dressing in the mid week. 11/3; patient returned from his vacation to [facility]. He was seen on 1 occasion at their wound care center. They did a 2 layer compression system as they did not have our 4-layer wrap. I am not completely certain what they put on the wounds. They did not change the Unna boot on the right. The patient is also seeing a lymphedema specialist physical therapist in [place]. It appears that he has some compression sleeve for his thighs which indeed look quite a bit better than I am used to seeing. He pumps over these with his external compression pumps. 11/10; the patient has a new wound on the right medial thigh otherwise there is no open areas on the right. He has an area on the left leg posteriorly anteriorly and medially and an area over the left second toe. We have been using silver alginate. He thinks the injury on his thigh is secondary to friction from the compression sleeve he has. 11/17; the patient has a new wound on the right medial thigh last week. He thinks this is because he did not have a underlying stocking for his thigh juxta lite apparatus. He now has this. The area is fairly large and somewhat angry but I do not think he has underlying cellulitis. ooHe has a intact blister on the right anterior tibial area. ooSmall wound on the right great toe dorsally ooSmall area on the medial left calf. 11/30; the patient does not have any open areas on his right leg and we did not take his juxta lite stocking off. However he states that on Friday his compression wrap fell down lodging around his upper  mid calf area. As usual this creates a lot of problems for him. He called urgently today to be seen for a nurse visit however the nurse visit turned into a provider visit because of extreme erythema and pain in the left anterior tibia extending laterally and posteriorly. The area that is problematic is extensive 10/06/2019 upon evaluation today patient actually appears to be doing poorly in regard to his left lower extremity. He Dr. Leanord Hawking did place him on doxycycline this past Monday apparently due to the fact that he was doing much worse in regard to this left leg. Fortunately  the doxycycline does seem to be helping. Unfortunately we are still having a very difficult time getting his edema under any type of control in order to anticipate discharge at some point. The only way were really able to control his lymphedema really is with compression wraps and that has only even seemingly temporary. He has been seeing a lymphedema clinic they are trying to help in this regard but still this has been somewhat frustrating in general for the patient. 10/13/19 on evaluation today patient appears to be doing excellent with regard to his right lower extremity as far as the wounds are concerned. His swelling is still quite extensive unfortunately. He is still having a lot of drainage from the thigh areas bilaterally which is unfortunate. He's been going to lymphedema clinic but again he still really does not have this edema under control as far as his lower extremities are concern. With regard to his left lower extremity this seems to be improving and I do believe the doxycycline has been of benefit for him. He is about to complete the doxycycline. 10/20/2019 on evaluation today patient appears to be doing poorly in regard to his bilateral lower extremities. More in the right thigh he has a lot of irritation at this site unfortunately. In regard to the left lower extremity the wrap was not quite as high it appears  and does seem to have caused him some trouble as well. Fortunately there is no evidence of systemic infection though he does have some blue-green drainage which has me concerned for the possibility of Pseudomonas. He tells me he is previously taking Cipro without complications and he really does not care for Levaquin however due to some of the side effects he has. He is not allergic to any medications specifically antibiotics that were aware of. 10/27/2019 on evaluation today patient actually does appear to be for the most part doing better when compared to last week's evaluation. With that being said he still has multiple open wounds over the bilateral lower extremities. He actually forgot to start taking the Cipro and states that he still has the whole bottle. He does have several new blisters on left lower extremity today I think I would recommend he go ahead and take the Cipro based on what I am seeing at this point. 12/30-Patient comes at 1 week visit, 4 layer compression wraps on the left and Unna boot on the right, primary dressing Xtrasorb and silver alginate. Patient is taking his Cipro and has a few more days left probably 5-6, and the legs are doing better. He states he is using his compressions devices which I believe he has 11/10/2019 on evaluation today patient actually appears to be much better than last time I saw him 2 weeks ago. His wounds are significantly improved and overall I am very pleased in this regard. Fortunately there is no signs of active infection at this time. He is just a couple of days away from completing Cipro. Overall his edema is much better he has been using his lymphedema pumps which I think is also helping at this point. 11/17/2019 on evaluation today patient appears to be doing excellent in regard to his wounds in general. His legs are swollen but not nearly as much as they have been in the past. Fortunately he is tolerating the compression wraps without  complication. No fevers, chills, nausea, vomiting, or diarrhea. He does have some erythema however in the distal portion of his right lower extremity specifically around the  forefoot and toes there is a little bit of warmth here as well. 11/24/2019 on evaluation today patient appears to be doing well with regard to his right lower extremity I really do not see any open wounds at this point. His left lower extremity does have several open areas and his right medial thigh also is open. Other than this however overall the patient seems to be making good progress and I am very pleased at this point. 12/01/2019 on evaluation today patient appears to be doing poorly at this point in regard to his left lower extremity has several new blisters despite the fact that we have him in compression wraps. In fact he had a 4-layer compression wrap, his upper thigh wrapped from lymphedema clinic, and a juxta light over top of the 4 layer compression wrap the lymphedema clinic applied and despite all this he still develop blisters underneath. Obviously this does have me concerned about the fact that unfortunately despite what we are doing to try to get wounds healed he continues to have new areas arise I do not think he is ever good to be at the point where he can realistically just use wraps at home to keep things under control. Typically when we heal him it takes about 1-2 days before he is back in the clinic with severe breakdown and blistering of his lower extremities bilaterally. This is happened numerous times in the past. Unfortunately I think that we may need some help as far as overall fluid overload to kind of limit what we are seeing and get things under better control. 12/08/2019 on evaluation today patient presents for follow-up concerning his ongoing bilateral lower extremity edema. Unfortunately he is still having quite a bit of swelling the compression wraps are controlling this to some degree but he did see  Dr. [name] his cardiologist I do have that available for review today as far as the appointment was concerned that was on 12/06/2019. Obviously that she has been 2 days ago. The patient states that he is only been taking the Lasix 80 mg 1 time a day he had told me previously he was taking this twice a day. Nonetheless Dr. [name] recommended this be up to 80 mg 2 times a day for the patient as he did appear to be fluid overloaded. With that being said the patient states he did this yesterday and he was unable to go anywhere or do anything due to the fact that he was constantly having to urinate. Nonetheless I think that this is still good to be something that is important for him as far as trying to get his edema under control at all things that he is going to be able to just expect his wounds to get under control and things to be better without going through at least a period of time where he is trying to stabilize his fluid management in general and I think increasing the Lasix is likely the first step here. It was also mentioned the possibility that the patient may require metolazone. With that being said he wanted to have the patient take Lasix twice a day first and then reevaluating 2 months to see where things stand. 12/15/2019 upon evaluation today patient appears to be doing regard to his legs although his toes are showing some signs of weeping especially on the left at this point to some degree on the right. There does not appear to be any signs of active infection and overall I do feel  like the compression wraps are doing well for him but he has not been able to take the Lasix at home and the increased dose that Dr. [name] recommended. He tells me that just not go to be feasible for him. Nonetheless I think in this case he should probably send a message to Dr. [name] in order to discuss options from the standpoint of possible admission to get the fluid off or otherwise going forward. 12/22/2019 upon  evaluation today patient appears to be doing fairly well with regard to his lower extremities at this point. In fact he would be doing excellent if it was not for the fact that his right anterior thigh apparently had an allergic reaction to adhesive tape that he used. The wound itself that we have been monitoring actually appears to be healed. There is a lot of irritation at this point. 12/29/2019 upon evaluation today patient appears to be doing well in regard to his lower extremities. His left medial thigh is open and somewhat draining today but this is the only region that is open the right has done much better with the treatment utilizing the steroid cream that I prescribed for him last week. Overall I am pleased in that regard. Fortunately there is no signs of active infection at this time. No fevers, chills, nausea, vomiting, or diarrhea. 01/05/2020 upon evaluation today patient appears to be doing more poorly in regard to his right lower extremity at this point upon evaluation today. Unfortunately he continues to have issues in this regard and I think the biggest issue is controlling his edema. This obviously is not very well controlled at this point is been recommended that he use the Lasix twice a day but he has not been able to do that. Unfortunately I think this is leading to an issue where honestly he is not really able to effectively control his edema and therefore the wounds really are not doing significantly better. I do not think that he is going to be able to keep things under good control unless he is able to control his edema much better. I discussed this again in great detail with him today. 01/12/2020 good news is patient actually appears to be doing quite well today at this point. He does have an appointment with lymphedema clinic tomorrow. His legs appear healed and the toe on the left is almost completely healed. In general I am very pleased with how things stand at this  point. 01/19/2020 upon evaluation today patient appears to actually be doing well in regard to his lower extremities there is nothing open at this point. Fortunately he has done extremely well more recently. Has been seeing lymphedema clinic as well. With that being said he has Velcro wraps for his lower legs as well as his upper legs. The only wound really is on his toe which is the right great toe and this is barely anything even there. With all that being said I think it is good to be appropriate today to go ahead and switch him over to the Velcro compression wraps. 01/26/2020 upon evaluation today patient appears to be doing worse with regard to his lower extremities after last week switch him to Velcro compression wraps. Unfortunately he lasted less than 24 hours he did not have the sock portion of his Velcro wrap on the left leg and subsequently developed a blister underneath the Velcro portion. Obviously this is not good and not what we were looking for at this point. He states  the lymphedema clinic did tell him to wear the wrap for 23 hours and take him off for 1 I am okay with that plan but again right now we got a get things back under control again he may have some cellulitis noted as well. 02/02/2020 upon evaluation today patient unfortunately appears to have several areas of blistering on his bilateral lower extremities today mainly on the feet. His legs do seem to be doing somewhat better which is good news. Fortunately there is no evidence of active infection at this time. No fevers, chills, nausea, vomiting, or diarrhea. 02/16/2020 upon evaluation today patient appears to be doing well at this time with regard to his legs. He has a couple weeping areas on his toes but for the most part everything is doing better and does appear to be sealed up on his legs which is excellent news. We can continue with wrapping him at this point as he had every time we discontinue the wraps he just breaks  out with new wounds. There is really no point in is going forward with this at this point. 03/08/2020 upon evaluation today patient actually appears to be doing quite well with regard to his lower extremity ulcers. He has just a very superficial and really almost nonexistent blister on the left lower extremity he has in general done very well with the compression wraps. With that being said I do not see any signs of infection at this time which is good news. 03/29/2020 upon evaluation today patient appears to be doing well with regard to his wounds currently except for where he had several new areas that opened up due to some of the wrap slipping and causing him trouble. He states he did not realize they had slipped. Nonetheless he has a 1 area on the right and 3 new areas on the left. Fortunately there is no signs of active infection at this time which is great news. 04/05/2020 upon evaluation today patient actually appears to be doing quite well in general in regard to his legs currently. Fortunately there is no signs of active infection at this time. No fevers, chills, nausea, vomiting, or diarrhea. He tells me next week that he will actually be seen in the lymphedema clinic on Thursday at 10 AM I see him on Wednesday next week. 04/12/2020 upon evaluation today patient appears to be doing very well with regard to his lower extremities bilaterally. In fact he does not appear to have any open wounds at this point which is good news. Fortunately there is no signs of active infection at this time. No fevers, chills, nausea, vomiting, or diarrhea. 04/19/2020 upon evaluation today patient appears to be doing well with regard to his wounds currently on the bilateral lower extremities. There does not appear to be any signs of active infection at this time. Fortunately there is no evidence of systemic infection and overall very pleased at this point. Nonetheless after I held him out last week he literally had  blisters the next morning already which swelled up with him being right back here in the clinic. Overall I think that he is just not can be able to be discharged with his legs the way they are he is much to volume overloaded as far as fluid is concerned and that was discussed with him today of also discussed this but should try the clinic nurse manager as well as Dr. Leanord Hawking. 04/26/2020 upon evaluation today patient appears to be doing better with regard to  his wounds currently. He is making some progress and overall swelling is under good control with the compression wraps. Fortunately there is no evidence of active infection at this time. 05/10/2020 on evaluation today patient appears to be doing overall well in regard to his lower extremities bilaterally. He is Tolerating the compression wraps without complication and with what we are seeing currently I feel like that he is making excellent progress. There is no signs of active infection at this time. 05/24/2020 upon evaluation today patient appears to be doing well in regard to his legs. The swelling is actually quite a bit down compared to where it has been in the past. Fortunately there is no sign of active infection at this time which is also good news. With that being said he does have several wounds on his toes that have opened up at this point. 05/31/2020 upon evaluation today patient appears to be doing well with regard to his legs bilaterally where he really has no significant fluid buildup at this point overall he seems to be doing quite well. Very pleased in this regard. With regard to his toes these also seem to be drying up which is excellent. We have continue to wrap him as every time we tried as a transition to the juxta light wraps things just do not seem to get any better. Objective Constitutional Well-nourished and well-hydrated in no acute distress. Vitals Time Taken: 1:38 PM, Height: 70 in, Weight: 380.2 lbs, BMI: 54.5, Temperature:  98.2 F, Pulse: 65 bpm, Respiratory Rate: 18 breaths/min, Blood Pressure: 121/67 mmHg. Respiratory normal breathing without difficulty. Psychiatric this patient is able to make decisions and demonstrates good insight into disease process. Alert and Oriented x 3. pleasant and cooperative. General Notes: Upon inspection patient's wounds actually showed signs of improvement there is really nothing on the legs at all in regard to his toes he has just a couple very small open areas at this point. Overall very pleased with where things stand there is no signs of active infection at this time. Integumentary (Hair, Skin) Wound #174 status is Open. Original cause of wound was Blister. The wound is located on the Left,Dorsal Foot. The wound measures 0.3cm length x 0.7cm width x 0.1cm depth; 0.165cm^2 area and 0.016cm^3 volume. The wound is limited to skin breakdown. There is no tunneling or undermining noted. There is a small amount of serous drainage noted. The wound margin is indistinct and nonvisible. There is small (1-33%) pink granulation within the wound bed. There is no necrotic tissue within the wound bed. Wound #177 status is Open. Original cause of wound was Blister. The wound is located on the Right T Great. The wound measures 2cm length x 2cm width x oe 0.1cm depth; 3.142cm^2 area and 0.314cm^3 volume. There is Fat Layer (Subcutaneous Tissue) Exposed exposed. There is no tunneling or undermining noted. There is a small amount of serosanguineous drainage noted. The wound margin is flat and intact. There is large (67-100%) red granulation within the wound bed. There is no necrotic tissue within the wound bed. Wound #178 status is Open. Original cause of wound was Gradually Appeared. The wound is located on the Right T Second. The wound measures 1cm length oe x 1.1cm width x 0.1cm depth; 0.864cm^2 area and 0.086cm^3 volume. There is Fat Layer (Subcutaneous Tissue) Exposed exposed. There is no  tunneling or undermining noted. There is a medium amount of serous drainage noted. The wound margin is flat and intact. There is large (67-100%) red  granulation within the wound bed. There is no necrotic tissue within the wound bed. Wound #179 status is Open. Original cause of wound was Blister. The wound is located on the Left T Fourth. The wound measures 0.4cm length x 0.2cm oe width x 0.1cm depth; 0.063cm^2 area and 0.006cm^3 volume. There is Fat Layer (Subcutaneous Tissue) Exposed exposed. There is no tunneling or undermining noted. There is a medium amount of serous drainage noted. The wound margin is flat and intact. There is large (67-100%) red granulation within the wound bed. There is no necrotic tissue within the wound bed. 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 Post procedure Diagnosis Wound #: Same as Pre-Procedure There was a Four Layer Compression Therapy Procedure by Yevonne Pax, RN. Post procedure Diagnosis Wound #: Same as Pre-Procedure Plan Follow-up Appointments: Return Appointment in 2 weeks. - Wed with Leonard Schwartz Nurse Visit: - 1 week for rewrap Dressing Change Frequency: Do not change entire dressing for one week. - both legs Skin Barriers/Peri-Wound Care: Moisturizing lotion - both legs Wound Cleansing: May shower with protection. Primary Wound Dressing: Wound #174 Left,Dorsal Foot: Calcium Alginate with Silver Wound #177 Right T Great: oe Calcium Alginate with Silver Wound  #178 Right T Second: oe Calcium Alginate with Silver Wound #179 Left T Fourth: oe Calcium Alginate with Silver Secondary Dressing: Wound #174 Left,Dorsal Foot: Dry Gauze Wound #177 Right T Great: oe Dry Gauze Wound #178 Right T Second: oe Dry Gauze Wound #179 Left T Fourth: oe Dry Gauze Edema Control: 4 layer compression: Left lower extremity Unna Boot to Right Lower Extremity Avoid standing for long periods of time Elevate legs to the level of the heart or above for 30 minutes daily and/or when sitting, a frequency of: - throughout the day Exercise regularly 1. I am going to recommend currently that we go ahead and initiate a continuation of treatment with the wound care measures as before utilizing silver alginate to any open areas on the toes there is really nothing that we need to provide silver alginate for in regard to the legs were just performing the compression wraps. 2. I am also going to recommend at this time that we continue with a 4-layer compression wrap to left lower extremity and Unna boot to the right lower extremity that seems to do well for the patient. 3. I also recommend he does continue to elevate his legs and try as much as possible to exercise regularly and take his diuretics as prescribed. We will see patient back for reevaluation in 2 weeks here in the clinic. If anything worsens or changes patient will contact our office for additional recommendations. We will see him in 1 week for nurse visit wrap change. Electronic Signature(s) Signed: 05/31/2020 2:16:19 PM By: Lenda Kelp PA-C Entered By: Lenda Kelp on 05/31/2020 14:16:19 -------------------------------------------------------------------------------- SuperBill Details Patient Name: Date of Service: [name], Arin [name]. 05/31/2020 Medical Record Number: [id number] Patient Account Number: [id number] Date of Birth/Sex: Treating RN: [birth year] (69 y.o. [name] Primary Care Provider:  Marney Setting, [name] Other Clinician: Referring Provider: Treating Provider/Extender: Lenda Kelp  [facility] [name], [name] in Treatment: 227 Diagnosis Coding ICD-10 Codes Code Description 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 skin L97.221 Non-pressure chronic ulcer of left calf limited to breakdown of skin 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 of bilateral lower extremity I89.0 Lymphedema, not elsewhere classified E11.622 Type 2 diabetes mellitus with other skin ulcer 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, unspecified L03.116 Cellulitis of left lower limb Facility Procedures CPT4 Code: 40981191 Description: (Facility Use Only) [phone] - APPLY UNNA BOOT RT Modifier: Quantity: 1 CPT4 Code: 21308657 Description: (Facility Use Only) [id number] - APPLY MULTLAY COMPRS LWR LT LEG Modifier: 59 Quantity: 1 Physician Procedures : CPT4 Code Description Modifier [id number] - WC PHYS LEVEL 3 - EST PT ICD-10 Diagnosis Description 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 skin L97.221 Non-pressure chronic ulcer of left calf limited to breakdown of  skin 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 of bilateral lower extremity I89.0 Lymphedema, not elsewhere classified Quantity: 1 Electronic Signature(s) Signed: 05/31/2020 2:16:35 PM By: Lenda Kelp PA-C Entered By: Lenda Kelp on 05/31/2020 14:16:33

## 2020-06-07 ENCOUNTER — Other Ambulatory Visit: Payer: Self-pay

## 2020-06-07 ENCOUNTER — Encounter (HOSPITAL_BASED_OUTPATIENT_CLINIC_OR_DEPARTMENT_OTHER): Payer: Medicare Other | Attending: Physician Assistant | Admitting: Physician Assistant

## 2020-06-07 ENCOUNTER — Ambulatory Visit (INDEPENDENT_AMBULATORY_CARE_PROVIDER_SITE_OTHER): Payer: Medicare Other | Admitting: Family Medicine

## 2020-06-07 DIAGNOSIS — E11622 Type 2 diabetes mellitus with other skin ulcer: Secondary | ICD-10-CM | POA: Insufficient documentation

## 2020-06-07 DIAGNOSIS — L97522 Non-pressure chronic ulcer of other part of left foot with fat layer exposed: Secondary | ICD-10-CM | POA: Diagnosis not present

## 2020-06-07 DIAGNOSIS — I482 Chronic atrial fibrillation, unspecified: Secondary | ICD-10-CM | POA: Diagnosis not present

## 2020-06-07 DIAGNOSIS — L03119 Cellulitis of unspecified part of limb: Secondary | ICD-10-CM

## 2020-06-07 DIAGNOSIS — E669 Obesity, unspecified: Secondary | ICD-10-CM | POA: Insufficient documentation

## 2020-06-07 DIAGNOSIS — Z6841 Body Mass Index (BMI) 40.0 and over, adult: Secondary | ICD-10-CM | POA: Insufficient documentation

## 2020-06-07 DIAGNOSIS — E1140 Type 2 diabetes mellitus with diabetic neuropathy, unspecified: Secondary | ICD-10-CM | POA: Diagnosis not present

## 2020-06-07 DIAGNOSIS — I872 Venous insufficiency (chronic) (peripheral): Secondary | ICD-10-CM

## 2020-06-07 DIAGNOSIS — Z9119 Patient's noncompliance with other medical treatment and regimen: Secondary | ICD-10-CM | POA: Insufficient documentation

## 2020-06-07 DIAGNOSIS — L97221 Non-pressure chronic ulcer of left calf limited to breakdown of skin: Secondary | ICD-10-CM | POA: Diagnosis not present

## 2020-06-07 DIAGNOSIS — I272 Pulmonary hypertension, unspecified: Secondary | ICD-10-CM | POA: Diagnosis not present

## 2020-06-07 DIAGNOSIS — L03116 Cellulitis of left lower limb: Secondary | ICD-10-CM | POA: Diagnosis not present

## 2020-06-07 DIAGNOSIS — I87333 Chronic venous hypertension (idiopathic) with ulcer and inflammation of bilateral lower extremity: Secondary | ICD-10-CM | POA: Diagnosis not present

## 2020-06-07 DIAGNOSIS — I890 Lymphedema, not elsewhere classified: Secondary | ICD-10-CM | POA: Insufficient documentation

## 2020-06-07 DIAGNOSIS — L97211 Non-pressure chronic ulcer of right calf limited to breakdown of skin: Secondary | ICD-10-CM | POA: Insufficient documentation

## 2020-06-07 DIAGNOSIS — E11621 Type 2 diabetes mellitus with foot ulcer: Secondary | ICD-10-CM | POA: Diagnosis not present

## 2020-06-07 NOTE — Progress Notes (Signed)
[name], [name] ([id number]) Visit Report for 06/07/2020 Arrival Information Details Patient Name: Date of Service: [name], [name] 06/07/2020 10:30 A M Medical Record Number: [id number] Patient Account Number: [id number] Date of Birth/Sex: Treating RN: March 04, 1951 (69 y.o. [name], [name] Primary Care Elohim Brune: [name], [name] Other Clinician: Referring [name]: Treating [name]/Extender: Worthy Keeler [name], [name] in Treatment: 5 Visit Information History Since Last Visit Added or deleted any medications: No Patient Arrived: Walker Any new allergies or adverse reactions: No Arrival Time: 10:38 Had a fall or experienced change in No Accompanied By: self activities of daily living that may affect Transfer Assistance: None risk of falls: Patient Identification Verified: Yes Signs or symptoms of abuse/neglect since last visito No Secondary Verification Process Completed: Yes Hospitalized since last visit: No Patient Requires Transmission-Based Precautions: No Implantable device outside of the clinic excluding No Patient Has Alerts: Yes cellular tissue based products placed in the center since last visit: Has Dressing in Place as Prescribed: Yes Pain Present Now: No Electronic Signature(s) Signed: 06/07/2020 11:12:17 AM By: Sandre Kitty Entered By: Sandre Kitty on 06/07/2020 10:39:13 -------------------------------------------------------------------------------- Compression Therapy Details Patient Name: Date of Service: [name], [name]. 06/07/2020 10:30 A M Medical Record Number: [id number] Patient Account Number: [id number] Date of Birth/Sex: Treating RN: 27-Apr-1951 (69 y.o. [name] Primary Care Amal Saiki: [name], [name] Other Clinician: Referring Ananth Fiallos: Treating [name]/Extender: Worthy Keeler [name], [name] in Treatment: 228 Compression Therapy Performed for Wound Assessment: NonWound Condition Lymphedema - Right  Leg Performed By: Clinician [name], RN Compression Type: [name] Post Procedure Diagnosis Same as Pre-procedure Electronic Signature(s) Signed: 06/07/2020 4:54:27 PM By: Baruch Gouty RN, BSN Entered By: Baruch Gouty on 06/07/2020 11:23:50 -------------------------------------------------------------------------------- Compression Therapy Details Patient Name: Date of Service: [name], [name]. 06/07/2020 10:30 A M Medical Record Number: [id number] Patient Account Number: [id number] Date of Birth/Sex: Treating RN: [birth year] (69 y.o. [name] Primary Care Dashawn Golda: [name], [name] Other Clinician: Referring Shevelle Smither: Treating [name]/Extender: Worthy Keeler [name], [name] in Treatment: 228 Compression Therapy Performed for Wound Assessment: Wound #180 Left,Proximal,Dorsal Foot Performed By: Clinician [name], RN Compression Type: Four Layer Post Procedure Diagnosis Same as Pre-procedure Electronic Signature(s) Signed: 06/07/2020 4:54:27 PM By: Baruch Gouty RN, BSN Entered By: Baruch Gouty on 06/07/2020 11:24:18 -------------------------------------------------------------------------------- Encounter Discharge Information Details Patient Name: Date of Service: [name], [name]. 06/07/2020 10:30 A M Medical Record Number: [id number] Patient Account Number: [id number] Date of Birth/Sex: Treating RN: 15-Aug-1951 (69 y.o. [name]) [name] Primary Care Parminder Trapani: [name], [name] Other Clinician: Referring Baine Decesare: Treating Laverna Dossett/Extender: Worthy Keeler [name], [name] in Treatment: 228 Encounter Discharge Information Items Discharge Condition: Stable Ambulatory Status: Walker Discharge Destination: Home Transportation: Private Auto Accompanied By: self Schedule Follow-up Appointment: Yes Clinical Summary of Care: Patient Declined Electronic Signature(s) Signed: 06/07/2020 4:35:51 PM By: [name] RN Entered By: [name] on 06/07/2020 12:09:50 -------------------------------------------------------------------------------- Lower Extremity Assessment Details Patient Name: Date of Service: [name], [name] 06/07/2020 10:30 A M Medical Record Number: [id number] Patient Account Number: [id number] Date of Birth/Sex: Treating RN: 1951-05-11 (69 y.o. [name]) [name] Primary Care Jakobee Brackins: [name], [name] Other Clinician: Referring Bernell Sigal: Treating Kaye Luoma/Extender: Worthy Keeler [name], [name] in Treatment: 228 Edema Assessment Assessed: [Left: No] [Right: No] Edema: [Left: Yes] [Right: Yes] Calf Left: Right: Point of Measurement: 25 cm From Medial Instep 40 cm 43 cm Ankle Left: Right: Point of Measurement: 9 cm From  Medial Instep 27.6 cm 28 cm Electronic Signature(s) Signed: 06/07/2020 4:35:51 PM By: [name] RN Entered By: [name] on 06/07/2020 11:10:40 -------------------------------------------------------------------------------- Sims Details Patient Name: Date of Service: [name], [name]. 06/07/2020 10:30 A M Medical Record Number: [id number] Patient Account Number: [id number] Date of Birth/Sex: Treating RN: October 03, 1951 (69 y.o. [name] Primary Care Taryne Kiger: [name], [name] Other Clinician: Referring Reianna Batdorf: Treating Zari Cly/Extender: Worthy Keeler [name], [name] in Treatment: 228 Active Inactive Venous Leg Ulcer Nursing Diagnoses: Actual venous Insuffiency (use after diagnosis is confirmed) Goals: Patient will maintain optimal edema control Date Initiated: 09/10/2016 Target Resolution Date: 06/28/2020 Goal Status: Active Verify adequate tissue perfusion prior to therapeutic compression application Date Initiated: 09/10/2016 Date Inactivated: 11/28/2016 Goal Status: Met Interventions: Assess peripheral edema status every visit. Compression as ordered Provide education on venous insufficiency Notes: edema not  contolled above wraps, pt not using lymoh pumps regularly Electronic Signature(s) Signed: 06/07/2020 4:54:27 PM By: Baruch Gouty RN, BSN Entered By: Baruch Gouty on 06/07/2020 11:22:36 -------------------------------------------------------------------------------- Pain Assessment Details Patient Name: Date of Service: [name], [name]. 06/07/2020 10:30 A M Medical Record Number: [id number] Patient Account Number: [id number] Date of Birth/Sex: Treating RN: 07/01/1951 (69 y.o. [name] Primary Care Girtrude Enslin: [name], [name] Other Clinician: Referring Amond Speranza: Treating Aiman Noe/Extender: Worthy Keeler [name], [name] in Treatment: 228 Active Problems Location of Pain Severity and Description of Pain Patient Has Paino No Site Locations Pain Management and Medication Current Pain Management: Electronic Signature(s) Signed: 06/07/2020 11:12:17 AM By: Sandre Kitty Signed: 06/07/2020 4:54:27 PM By: Baruch Gouty RN, BSN Entered By: Sandre Kitty on 06/07/2020 10:39:38 -------------------------------------------------------------------------------- Patient/Caregiver Education Details Patient Name: Date of Service: [name], [name] 8/4/2021andnbsp10:30 A M Medical Record Number: [id number] Patient Account Number: [id number] Date of Birth/Gender: Treating RN: 1951/09/29 (69 y.o. [name] Primary Care Physician: [name], [name] Other Clinician: Referring Physician: Treating Physician/Extender: [name], [place] in Treatment: 228 Education Assessment Education Provided To: Patient Education Topics Provided Venous: Methods: Explain/Verbal Responses: Reinforcements needed, State content correctly Electronic Signature(s) Signed: 06/07/2020 4:54:27 PM By: Baruch Gouty RN, BSN Entered By: Baruch Gouty on 06/07/2020 11:23:00 -------------------------------------------------------------------------------- Wound Assessment  Details Patient Name: Date of Service: [name], [name]. 06/07/2020 10:30 A M Medical Record Number: [id number] Patient Account Number: [id number] Date of Birth/Sex: Treating RN: October 14, 1951 (69 y.o. [name] Primary Care Reign Dziuba: [name], [name] Other Clinician: Referring Stanly Si: Treating Hriday Stai/Extender: Worthy Keeler [name], [name] in Treatment: 228 Wound Status Wound Number: [id number] Primary Lymphedema Etiology: Wound Location: Left, Dorsal Foot Wound Healed - Epithelialized Wounding Event: Blister Status: Date Acquired: 04/19/2020 Comorbid Chronic sinus problems/congestion, Arrhythmia, Hypertension, Weeks Of Treatment: 7 History: Peripheral Arterial Disease, Type II Diabetes, History of Burn, Clustered Wound: No Gout, Confinement Anxiety Wound Measurements Length: (cm) Width: (cm) Depth: (cm) Area: (cm) Volume: (cm) 0 % Reduction in Area: 100% 0 % Reduction in Volume: 100% 0 Epithelialization: Large (67-100%) 0 Tunneling: No 0 Undermining: No Wound Description Classification: Full Thickness Without Exposed Support Structures Exudate Amount: None Present Foul Odor After Cleansing: No Slough/Fibrino No Wound Bed Granulation Amount: None Present (0%) Exposed Structure Necrotic Amount: None Present (0%) Fascia Exposed: No Fat Layer (Subcutaneous Tissue) Exposed: No Tendon Exposed: No Muscle Exposed: No Joint Exposed: No Bone Exposed: No Electronic Signature(s) Signed: 06/07/2020 4:35:51 PM By: [name] RN Signed: 06/07/2020 4:54:27 PM By: Baruch Gouty RN, BSN Entered By: [name] on 06/07/2020  11:11:39 -------------------------------------------------------------------------------- Wound Assessment Details Patient Name: Date of Service: [name], [name] 06/07/2020 10:30 A M Medical Record Number: [id number] Patient Account Number: [id number] Date of Birth/Sex: Treating RN: 08/23/1951 (69 y.o. [name] Primary Care Avel Ogawa:  [name], [name] Other Clinician: Referring Mariana Goytia: Treating Marselino Slayton/Extender: Worthy Keeler [name], [name] in Treatment: 228 Wound Status Wound Number: [id number] Primary Etiology: Lymphedema Wound Location: Right T Great oe Wound Status: Open Wounding Event: Blister Date Acquired: 05/24/2020 Weeks Of Treatment: 2 Clustered Wound: No Wound Measurements Length: (cm) 1.5 Width: (cm) 2.2 Depth: (cm) 0.1 Area: (cm) 2.592 Volume: (cm) 0.259 % Reduction in Area: -1551% % Reduction in Volume: -1518.7% Wound Description Classification: Full Thickness Without Exposed Support Structu res Treatment Notes Wound #177 (Right Toe Great) 1. Cleanse With Wound Cleanser Soap and water 3. Primary Dressing Applied Calcium Alginate Ag 4. Secondary Dressing ABD Pad Dry Gauze 6. Support Layer Applied 4 layer compression wrap Unna Boot Notes unna boot to the right, 4 layer to the left Electronic Signature(s) Signed: 06/07/2020 11:12:17 AM By: Sandre Kitty Signed: 06/07/2020 4:54:27 PM By: Baruch Gouty RN, BSN Entered By: Sandre Kitty on 06/07/2020 11:04:27 -------------------------------------------------------------------------------- Wound Assessment Details Patient Name: Date of Service: [name], [name]. 06/07/2020 10:30 A M Medical Record Number: [id number] Patient Account Number: [id number] Date of Birth/Sex: Treating RN: [birth year] (69 y.o. [name] Primary Care Marzell Allemand: [name], [name] Other Clinician: Referring Levy Cedano: Treating Meilyn Heindl/Extender: Worthy Keeler [facility] [name], [name] in Treatment: 228 Wound Status Wound Number: [id number] Primary Lymphedema Etiology: Wound Location: Right T Second oe Wound Open Wounding Event: Gradually Appeared Status: Date Acquired: 05/24/2020 Comorbid Chronic sinus problems/congestion, Arrhythmia, Hypertension, Weeks Of Treatment: 2 History: Peripheral Arterial Disease, Type II Diabetes, History of  Burn, Clustered Wound: No Gout, Confinement Anxiety Wound Measurements Length: (cm) 1 Width: (cm) 1 Depth: (cm) 0.1 Area: (cm) 0.785 Volume: (cm) 0.079 % Reduction in Area: -400% % Reduction in Volume: -393.7% Epithelialization: Small (1-33%) Tunneling: No Undermining: No Wound Description Classification: Full Thickness Without Exposed Support Structures Wound Margin: Flat and Intact Exudate Amount: None Present Foul Odor After Cleansing: No Slough/Fibrino No Wound Bed Granulation Amount: None Present (0%) Exposed Structure Necrotic Amount: None Present (0%) Fascia Exposed: No Fat Layer (Subcutaneous Tissue) Exposed: No Tendon Exposed: No Muscle Exposed: No Joint Exposed: No Bone Exposed: No Treatment Notes Wound #178 (Right Toe Second) 1. Cleanse With Wound Cleanser Soap and water 3. Primary Dressing Applied Calcium Alginate Ag 4. Secondary Dressing ABD Pad Dry Gauze 6. Support Layer Applied 4 layer compression wrap Unna Boot Notes unna boot to the right, 4 layer to the left Electronic Signature(s) Signed: 06/07/2020 4:35:51 PM By: [name] RN Signed: 06/07/2020 4:54:27 PM By: Baruch Gouty RN, BSN Previous Signature: 06/07/2020 11:12:17 AM Version By: Sandre Kitty Entered By: [name] on 06/07/2020 11:12:16 -------------------------------------------------------------------------------- Wound Assessment Details Patient Name: Date of Service: [facility], [place] [name]. 06/07/2020 10:30 A M Medical Record Number: [id number] Patient Account Number: [id number] Date of Birth/Sex: Treating RN: [birth year] (69 y.o. [name] Primary Care Marceil Welp: [name], [name] Other Clinician: Referring Hartlyn Reigel: Treating Talitha Dicarlo/Extender: Worthy Keeler [facility] [name], [name] in Treatment: 228 Wound Status Wound Number: [id number] Primary Lymphedema Etiology: Wound Location: Left T Fourth oe Wound Healed - Epithelialized Wounding Event: Blister Status: Date Acquired:  05/24/2020 Comorbid Chronic sinus problems/congestion, Arrhythmia, Hypertension, Weeks Of Treatment: 2 History: Peripheral Arterial Disease, Type II Diabetes, History of Burn, Clustered Wound: No Gout, Confinement Anxiety  Wound Measurements Length: (cm) Width: (cm) Depth: (cm) Area: (cm) Volume: (cm) 0 % Reduction in Area: 100% 0 % Reduction in Volume: 100% 0 Epithelialization: Large (67-100%) 0 Tunneling: No 0 Undermining: No Wound Description Classification: Full Thickness Without Exposed Support Structures Exudate Amount: None Present Foul Odor After Cleansing: No Slough/Fibrino No Wound Bed Granulation Amount: None Present (0%) Exposed Structure Necrotic Amount: None Present (0%) Fascia Exposed: No Fat Layer (Subcutaneous Tissue) Exposed: No Tendon Exposed: No Muscle Exposed: No Joint Exposed: No Bone Exposed: No Electronic Signature(s) Signed: 06/07/2020 4:35:51 PM By: [name] RN Signed: 06/07/2020 4:54:27 PM By: Baruch Gouty RN, BSN Previous Signature: 06/07/2020 11:12:17 AM Version By: Sandre Kitty Entered By: [name] on 06/07/2020 11:13:09 -------------------------------------------------------------------------------- Wound Assessment Details Patient Name: Date of Service: [name], Jarrett [name]. 06/07/2020 10:30 A M Medical Record Number: [id number] Patient Account Number: [id number] Date of Birth/Sex: Treating RN: 1951-07-11 (69 y.o. [name] Primary Care Wafaa Deemer: [name], [name] Other Clinician: Referring Steffon Gladu: Treating Ella Guillotte/Extender: Worthy Keeler [facility] [name], [name] in Treatment: 228 Wound Status Wound Number: [id number] Primary Diabetic Wound/Ulcer of the Lower Extremity Etiology: Wound Location: Left, Proximal, Dorsal Foot Wound Open Wounding Event: Blister Status: Date Acquired: 06/07/2020 Comorbid Chronic sinus problems/congestion, Arrhythmia, Hypertension, Weeks Of Treatment: 0 History: Peripheral Arterial Disease, Type II  Diabetes, History of Burn, Clustered Wound: No Gout, Confinement Anxiety Wound Measurements Length: (cm) 1 Width: (cm) 1.2 Depth: (cm) 0.1 Area: (cm) 0.942 Volume: (cm) 0.094 % Reduction in Area: 0% % Reduction in Volume: 0% Epithelialization: None Tunneling: No Undermining: No Wound Description Classification: Grade 2 Exudate Amount: Medium Exudate Type: Serosanguineous Exudate Color: red, brown Foul Odor After Cleansing: No Slough/Fibrino No Wound Bed Granulation Amount: Large (67-100%) Exposed Structure Granulation Quality: Red Fascia Exposed: No Necrotic Amount: None Present (0%) Fat Layer (Subcutaneous Tissue) Exposed: No Tendon Exposed: No Muscle Exposed: No Joint Exposed: No Bone Exposed: No Treatment Notes Wound #180 (Left, Proximal, Dorsal Foot) 1. Cleanse With Wound Cleanser Soap and water 3. Primary Dressing Applied Calcium Alginate Ag 4. Secondary Dressing ABD Pad Dry Gauze 6. Support Layer Applied 4 layer compression wrap Unna Boot Notes unna boot to the right, 4 layer to the left Electronic Signature(s) Signed: 06/07/2020 4:35:51 PM By: [name] RN Signed: 06/07/2020 4:54:27 PM By: Baruch Gouty RN, BSN Previous Signature: 06/07/2020 11:12:17 AM Version By: Sandre Kitty Entered By: [name] on 06/07/2020 11:14:49 -------------------------------------------------------------------------------- Wound Assessment Details Patient Name: Date of Service: [name], Eragon [name]. 06/07/2020 10:30 A M Medical Record Number: [id number] Patient Account Number: [id number] Date of Birth/Sex: Treating RN: 1951-05-24 (69 y.o. [name] Primary Care Dasia Guerrier: [name], [name] Other Clinician: Referring Phillips Goulette: Treating Donnalee Cellucci/Extender: Worthy Keeler [facility] [name], [name] in Treatment: 228 Wound Status Wound Number: [id number] Primary Etiology: Diabetic Wound/Ulcer of the Lower Extremity Wound Location: Left T Second oe Wound Status:  Open Wounding Event: Blister Date Acquired: 06/07/2020 Weeks Of Treatment: 0 Clustered Wound: No Wound Measurements Length: (cm) Width: (cm) Depth: (cm) Area: (cm) Volume: (cm) 1.6 % Reduction in Area: 1 % Reduction in Volume: 0.1 1.257 0.126 Treatment Notes Wound #181 (Left Toe Second) 1. Cleanse With Wound Cleanser Soap and water 3. Primary Dressing Applied Calcium Alginate Ag 4. Secondary Dressing ABD Pad Dry Gauze 6. Support Layer Applied 4 layer compression wrap Unna Boot Notes unna boot to the right, 4 layer to the left Electronic Signature(s) Signed: 06/07/2020 11:12:17 AM By: Sandre Kitty Signed: 06/07/2020 4:54:27 PM By: Baruch Gouty  RN, BSN Entered By: Sandre Kitty on 06/07/2020 11:04:28 -------------------------------------------------------------------------------- Vitals Details Patient Name: Date of Service: [name], Jaikob [name]. 06/07/2020 10:30 A M Medical Record Number: [id number] Patient Account Number: [id number] Date of Birth/Sex: Treating RN: [birth year] (70 y.o. [name] Primary Care Allice Garro: Other Clinician: [name], [name] Referring Maysen Sudol: Treating Nyra Anspaugh/Extender: Worthy Keeler [facility] [name], [name] in Treatment: 228 Vital Signs Time Taken: 10:40 Temperature (F): 97.8 Height (in): 70 Pulse (bpm): 73 Weight (lbs): 380.2 Respiratory Rate (breaths/min): 18 Body Mass Index (BMI): 54.5 Blood Pressure (mmHg): 172/66 Reference Range: 80 - 120 mg / dl Electronic Signature(s) Signed: 06/07/2020 11:12:17 AM By: Sandre Kitty Entered By: Sandre Kitty on 06/07/2020 10:39:31

## 2020-06-07 NOTE — Progress Notes (Signed)
[name] a 68 y.o.malepresents to the office today for bicillininjections, per physician's orders.  Bicillin 600,000 Unitswas administeredbilaterally in Right and Left upper outter quadtoday. Patient tolerated injection.  Patient due for follow up labs/provider appt:Yes. Date due:07/12/20, appt madeYes Next injection due:07/12/20, appt made:yes- with provider  Misty Stanley, CMA

## 2020-06-07 NOTE — Progress Notes (Addendum)
MAYCOL, HOYING ([id number]) Visit Report for 06/07/2020 Chief Complaint Document Details Patient Name: Date of Service: CO MUHAMAD, SERANO 06/07/2020 10:30 A M Medical Record Number: [id number] Patient Account Number: [id number] Date of Birth/Sex: Treating RN: Apr 11, 1951 (69 y.o. [name] Primary Care Provider: Marney Setting, [name] Other Clinician: Referring Provider: Treating Provider/Extender: Lenda Kelp [facility] [name], [name] in Treatment: 228 Information Obtained from: Patient Chief Complaint patient is here for evaluation venous/lymphedema weeping Electronic Signature(s) Signed: 06/07/2020 10:43:30 AM By: Lenda Kelp PA-C Entered By: Lenda Kelp on 06/07/2020 10:43:29 -------------------------------------------------------------------------------- HPI Details Patient Name: Date of Service: [name], Javarian J. 06/07/2020 10:30 A M Medical Record Number: [id number] Patient Account Number: [id number] Date of Birth/Sex: Treating RN: 05-31-1951 (69 y.o. [name] Primary Care Provider: Marney Setting, [name] Other Clinician: Referring Provider: Treating Provider/Extender: Lenda Kelp [facility] [name], [name] in Treatment: 228 History of Present Illness HPI Description: Referred by PCP for consultation. Patient has long standing history of BLE venous stasis, no prior ulcerations. At beginning of month, developed cellulitis and weeping. Received IM Rocephin followed by Keflex and resolved. Wears compression stocking, appr 6 months old. Not sure strength. No present drainage. 01/22/16 this is a patient who is a type II diabetic on insulin. 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 He tells me he religiously wears pressure stockings of uncertain strength. He was here with weeping edema about 8 months ago but did not have an open wound. Roughly a month ago he had a reopening on his bilateral legs. He is been using bandages and Neosporin. He  does not complain of pain. 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 Last BUN/creatinine I have is from 11/20/15 at 13 and 1.0 respectively 01/29/16; patient arrives today having tolerated the Profore wrap. He brought in his stockings and these are 18 mmHg stockings he bought from [place]. The compression here is likely inadequate. He does not complain of pain or excessive drainage she has no systemic symptoms. The wound on the right looks improved as does the one on the left although one on the left is more substantial with still tissue at risk below the actual wound area on the bilateral posterior calf 02/05/16; patient arrives with poor edema control. He states that we did put a 4 layer compression on it last week. No weight appear 5 this. 02/12/16; the area on the posterior right Has healed. The left Has a substantial wound that has necrotic surface eschar that requires a debridement with a curette. 02/16/16;the patient called or a Nurse visit secondary to increased swelling. He had been in earlier in the week with his right leg healed. He was transitioned to is on pressure stocking on the right leg with the only open wound on the left, a substantial area on the left posterior calf. Note he has a history of severe lower extremity edema, he has a history of chronic atrial fibrillation but not heart failure per my notes but I'll need to research this. He is not complaining of chest pain shortness of breath or orthopnea. The intake nurse noted blisters on the previously closed right leg 02/19/16; this is the patient's regular visit day. I see him on Friday with escalating edema new wounds on the right leg and clear signs of at least right ventricular heart failure. I increased his Lasix to  40 twice a day. He is returning currently in follow-up. States he is noticed a decrease in that the  edema 02/26/16 patient's legs have much less edema. There is nothing really open on the right leg. The left leg has improved condition of the large superficial wound on the posterior left leg 03/04/16; edema control is very much better. The patient's right leg wounds have healed. On the left leg he continues to have severe venous inflammation on the posterior aspect of the left leg. There is no tenderness and I don't think any of this is cellulitis. 03/11/16; patient's right leg is married healed and he is in his own stocking. The patient's left leg has deteriorated somewhat. There is a lot of erythema around the wound on the posterior left leg. There is also a significant rim of erythema posteriorly just above where the wrap would've ended there is a new wound in this location and a lot of tenderness. Can't rule out cellulitis in this area. 03/15/16; patient's right leg remains healed and he is in his own stocking. The patient's left leg is much better than last review. His major wound on the posterior aspect of his left Is almost fully epithelialized. He has 3 small injuries from the wraps. Really. Erythema seems a lot better on antibiotics 03/18/16; right leg remains healed and he is in his own stocking. The patient's left leg is much better. The area on the posterior aspect of the left calf is fully epithelialized. His 3 small injuries which were wrap injuries on the left are improved only one seems still open his erythema has resolved 03/25/16; patient's right leg remains healed and he is in his own stocking. There is no open area today on the left leg posterior leg is completely closed up. His wrap injuries at the superior aspect of his leg are also resolved. He looks as though he has some irritation on the dorsal ankle but this is fully epithelialized without evidence of infection. 03/28/16; we discharged this patient on Monday. Transitioned him into his own stocking. There were problems almost  immediately with uncontrolled swelling weeping edema multiple some of which have opened. He does not feel systemically unwell in particular no chest pain no shortness of breath and he does not feel 04/08/16; the edema is under better control with the Profore light wrap but he still has pitting edema. There is one large wound anteriorly 2 on the medial aspect of his left leg and 3 small areas on the superior posterior calf. Drainage is not excessive he is tolerating a Profore light well 04/15/16; put a Profore wrap on him last week. This is controlled is edema however he had a lot of pain on his left anterior foot most of his wounds are healed 04/22/16 once again the patient has denuded areas on the left anterior foot which he states are because his wrap slips up word. He saw his primary physician today is on Lasix 40 twice a day and states that he his weight is down 20 pounds over the last 3 months. 04/29/16: Much improved. left anterior foot much improved. He is now on Lasix 80 mg per day. Much improved edema control 05/06/16; I was hoping to be able to discharge him today however once again he has blisters at a low level of where the compression was placed last week mostly on his left lateral but also his left medial leg and a small area on the anterior part of the left foot. 05/09/16;  apparently the patient went home after his appointment on 7/4 later in the evening developing pain in his upper medial thigh together with subjective fever and chills although his temperature was not taken. The pain was so intense he felt he would probably have to call 911. However he then remembered that he had leftover doxycycline from a previous round of antibiotics and took these. By the next morning he felt a lot better. He called and spoke to one of our nurses and I approved doxycycline over the phone thinking that this was in relation to the wounds we had previously seen although they were definitely were not. The  patient feels a lot better old fever no chills he is still working. Blood sugars are reasonably controlled 05/13/16; patient is back in for review of his cellulitis on his anterior medial upper thigh. He is taking doxycycline this is a lot better. Culture I did of the nodular area on the dorsal aspect of his foot grew MRSA this also looks a lot better. 05/20/16; the patient is cellulitis on the medial upper thigh has resolved. All of his wound areas including the left anterior foot, areas on the medial aspect of the left calf and the lateral aspect of the calf at all resolved. He has a new blister on the left dorsal foot at the level of the fourth toe this was excised. No evidence of infection 05/27/16; patient continues to complain weeping edema. He has new blisterlike wounds on the left anterior lateral and posterior lateral calf at the top of his wrap levels. The area on his left anterior foot appears better. He is not complaining of fever, pain or pruritus in his feet. 05/30/16; the patient's blisters on his left anterior leg posterior calf all look improved. He did not increase the Lasix 100 mg as I suggested because he was going to run out of his 40 mg tablets. He is still having weeping edema of his toes 06/03/16; I renewed his Lasix at 80 mg once a day as he was about to run out when I last saw him. He is on 80 mg of Lasix now. I have asked him to cut down on the excessive amount of water he was drinking and asked him to drink according to his thirst mechanisms 06/12/2016 -- was seen 2 days ago and was supposed to wear his compression stockings at home but he is developed lymphedema and superficial blisters on the left lower extremity and hence came in for a review 06/24/16; the remaining wound is on his left anterior leg. He still has edema coming from between his toes. There is lymphedema here however his edema is generally better than when I last saw this. He has a history of atrial fibrillation  but does not have a known history of congestive heart failure nevertheless I think he probably has this at least on a diastolic basis. 07/01/16 I reviewed his echocardiogram from January 2017. This was essentially normal. He did not have LVH, EF of 55-60%. His right ventricular function was normal although he did have trivial tricuspid and pulmonic regurgitation. This is not audible on exam however. I increased his Lasix to do massive edema in his legs well above his knees I think in early July. He was also drinking an excessive amount of water at the time. 07/15/16; missed his appointment last week because of the Labor Day holiday on Monday. He could not get another appointment later in the week. Started to feel the wrap  digging in superiorly so we remove the top half and the bottom half of his wrap. He has extensive erythema and blistering superiorly in the left leg. Very tender. Very swollen. Edema in his foot with leaking edema fluid. He has not been systemically unwell 07/22/16; the area on the left leg laterally required some debridement. The medial wounds look more stable. His wrap injury wounds appear to have healed. Edema and his foot is better, weeping edema is also better. He tells me he is meeting with the supplier of the external compression pumps at work 08/05/16; the patient was on vacation last week in [facility]. His wrap is been on for an extended period of time. Also over the weekend he developed an extensive area of tender erythema across his anterior medial thigh. He took to doxycycline yesterday that he had leftover from a previous prescription. The patient complains of weeping edema coming out of his toes 08/08/16; I saw this patient on 10/2. He was tender across his anterior thigh. I put him on doxycycline. He returns today in follow-up. He does not have any open wounds on his lower leg, he still has edema weeping into his toes. 08/12/16; patient was seen back urgently today to  follow-up for his extensive left thigh cellulitis/erysipelas. He comes back with a lot less swelling and erythema pain is much better. I believe I gave him Augmentin and Cipro. His wrap was cut down as he stated a roll down his legs. He developed blistering above the level of the wrap that remained. He has 2 open blisters and 1 intact. 08/19/16; patient is been doing his primary doctor who is increased his Lasix from 40-80 once a day or 80 already has less edema. Cellulitis has remained improved in the left thigh. 2 open areas on the posterior left calf 08/26/16; he returns today having new open blisters on the anterior part of his left leg. He has his compression pumps but is not yet been shown how to use some vital representative from the supplier. 09/02/16 patient returns today with no open wounds on the left leg. Some maceration in his plantar toes 09/10/2016 -- Dr. Leanord Hawking had recently discharged him on 09/02/2016 and he has come right back with redness swelling and some open ulcers on his left lower extremity. He says this was caused by trying to apply his compression stockings and he's been unable to use this and has not been able to use his lymphedema pumps. He had some doxycycline leftover and he has started on this a few days ago. 09/16/16; there are no open wounds on his leg on the left and no evidence of cellulitis. 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 He does not complain of symptoms here. I am not clear use using his external compression pumps. 09/23/16; I have not seen this patient in 2 weeks. He canceled his appointment 10 days ago as he was going on vacation. He tells me that on Monday he noticed a large area on his posterior left leg which is been draining copiously and is reopened into a large wound. He is been using ABDs and the external part of his juxtalite, according to our nurse this was not on properly. 10/07/16;  Still a substantial area on the posterior left leg. Using silver alginate 10/14/16; in general better although there is still open area which looks healthy. Still using silver alginate. He reminds me that this happen before he  left for [facility]. T oday while he was showering in the morning. He had been using his juxtalite's 10/21/16; the area on his posterior left leg is fully epithelialized. However he arrives today with a large area of tender erythema in his medial and posterior left thigh just above the knee. I have marked the area. Once again he is reluctant to consider hospitalization. I treated him with oral antibiotics in the past for a similar situation with resolution I think with doxycycline however this area it seems more extensive to me. He is not complaining of fever but does have chills and says states he is thirsty. His blood sugar today was in the 140s at home 10/25/16 the area on his posterior left leg is fully epithelialized although there is still some weeping edema. The large area of tenderness and erythema in his medial and posterior left thigh is a lot less tender although there is still a lot of swelling in this thigh. He states he feels a lot better. He is on doxycycline and Augmentin that I started last week. This will continued until Tuesday, December 26. I have ordered a duplex ultrasound of the left thigh rule out DVT whether there is an abscess something that would need to be drained I would also like to know. 11/01/16; he still has weeping edema from a not fully epithelialized area on his left posterior calf. Most of the rest of this looks a lot better. He has completed his antibiotics. His thigh is a lot better. Duplex ultrasound did not show a DVT in the thigh 11/08/16; he comes in today with more Denuded surface epithelium from the posterior aspect of his calf. There is no real evidence of cellulitis. The superior aspect of his wrap appears to have put quite an  indentation in his leg just below the knee and this may have contributed. He does not complain of pain or fever. We have been using silver alginate as the primary dressing. The area of cellulitis in the right thigh has totally resolved. He has been using his compression stockings once a week 11/15/16; the patient arrives today with more loss of epithelium from the posterior aspect of his left calf. He now has a fairly substantial wound in this area. The reason behind this deterioration isn't exactly clear although his edema is not well controlled. He states he feels he is generally more swollen systemically. He is not complaining of chest pain shortness of breath fever. T me he has an appointment with his primary physician in early February. He is on 80 mg of oral ells Lasix a day. He claims compliance with the external compression pumps. He is not having any pain in his legs similar to what he has with his recurrent cellulitis 11/22/16; the patient arrives a follow-up of his large area on his left lateral calf. This looks somewhat better today. He came in earlier in the week for a dressing change since I saw him a week ago. He is not complaining of any pain no shortness of breath no chest pain 11/28/16; the patient arrives for follow-up of his large area on the left lateral calf this does not look better. In fact it is larger weeping edema. The surface of the wound does not look too bad. We have been using silver alginate although I'm not certain that this is a dressing issue. 12/05/16; again the patient follows up for a large wound on the left lateral and left posterior calf this does not  look better. There continues to be weeping edema necrotic surface tissue. More worrisome than this once again there is erythema below the wound involving the distal Achilles and heel suggestive of cellulitis. He is on his feet working most of the day of this is not going well. We are changing his dressing twice a week to  facilitate the drainage. 12/12/16; not much change in the overall dimensions of the large area on the left posterior calf. This is very inflamed looking. I gave him an. Doxycycline last week does not really seem to have helped. He found the wrap very painful indeed it seems to of dog into his legs superiorly and perhaps around the heel. He came in early today because the drainage had soaked through his dressings. 12/19/16- patient arrives for follow-up evaluation of his left lower extremity ulcers. He states that he is using his lymphedema pumps once daily when there is "no drainage". He admits to not using his lipedema pumps while under current treatment. His blood sugars have been consistently between 150-200. 12/26/16; the patient is not using his compression pumps at home because of the wetness on his feet. I've advised him that I think it's important for him to use this daily. He finds his feet too wet, he can put a plastic bag over his legs while he is in the pumps. Otherwise I think will be in a vicious circle. We are using silver alginate to the major area on his left posterior calf 01/02/17; the patient's posterior left leg has further of all into 3 open wounds. All of them covered with a necrotic surface. He claims to be using his compression pumps once a day. His edema control is marginal. Continue with silver alginate 01/10/17; the patient's left posterior leg actually looks somewhat better. There is less edema, less erythema. Still has 3 open areas covered with a necrotic surface requiring debridement. He claims to be using his compression pumps once a day his edema control is better 01/17/17; the patient's left posterior calf look better last week when I saw him and his wrap was changed 2 days ago. He has noted increasing pain in the left heel and arrives today with much larger wounds extensive erythema extending down into the entire heel area especially tender medially. He is not  systemically unwell CBGs have been controlled no fever. Our intake nurse showed me limegreen drainage on his AVD pads. 01/24/17; his usual this patient responds nicely to antibiotics last week giving him Levaquin for presumed Pseudomonas. The whole entire posterior part of his leg is much better much less inflamed and in the case of his Achilles heel area much less tender. He has also had some epithelialization posteriorly there are still open areas here and still draining but overall considerably better 01/31/17- He has continue to tolerate the compression wraps. he states that he continues to use the lymphedema pumps daily, and can increase to twice daily on the weekends. He is voicing no complaints or concerns regarding his LLE ulcers 02/07/17-he is here for follow-up evaluation. He states that he noted some erythema to the left medial and anterior thigh, which he states is new as of yesterday. He is concerned about recurrent cellulitis. He states his blood sugars have been slightly elevated, this morning in the 180s 02/14/17; he is here for follow-up evaluation. When he was last here there was erythema superiorly from his posterior wound in his anterior thigh. He was prescribed Levaquin however a culture of the wound surface  grew MRSA over the phone I changed him to doxycycline on Monday and things seem to be a lot better. 02/24/17; patient missed his appointment on Friday therefore we changed his nurse visit into a physician visit today. Still using silver alginate on the large area of the posterior left thigh. He isn't new area on the dorsal left second toe 03/03/17; actually better today although he admits he has not used his external compression pumps in the last 2 days or so because of work responsibilities over the weekend. 03/10/17; continued improvement. External compression pumps once a day almost all of his wounds have closed on the posterior left calf. Better edema control 03/17/17; in general  improved. He still has 3 small open areas on the lateral aspect of his left leg however most of the area on the posterior part of his leg is epithelialized. He has better edema control. He has an ABD pad under his stocking on the right anterior lower leg although he did not let us look at that today. 03/24/17; patient arrives back in clinic today with no open areas however there are areas on the posterior left calf and anterior left calf that are less than 100% epithelialized. His edema is well controlled in the left lower leg. There is some pitting edema probably lymphedema in the left upper thigh. He uses compression pumps at home once per day. I tried to get him to do this twice a day although he is very reticent. 04/01/2017 -- for the last 2 days he's had significant redness, tenderness and weeping and came in for an urgent visit today. 04/07/17; patient still has 6 more days of doxycycline. He was seen by Dr. Meyer Russel last Wednesday for cellulitis involving the posterior aspect, lateral aspect of his Involving his heel. For the most part he is better there is less erythema and less weeping. He has been on his feet for 12 hours 2 over the weekend. Using his compression pumps once a day 04/14/17 arrives today with continued improvement. Only one area on the posterior left calf that is not fully epithelialized. He has intense bilateral venous inflammation associated with his chronic venous insufficiency disease and secondary lymphedema. We have been using silver alginate to the left posterior calf wound In passing he tells [place] today that the right leg but we have not seen in quite some time has an open area on it but he doesn't want [place] to look at this today states he will show this to [place] next week. 04/21/17; there is no open area on his left leg although he still reports some weeping edema. He showed [place] his right leg today which is the first time we've seen this leg in a long time. He has a large area of  open wound on the right leg anteriorly healthy granulation. Quite a bit of swelling in the right leg and some degree of venous inflammation. He told us about the right leg in passing last week but states that deterioration in the right leg really only happened over the weekend 04/28/17; there is no open area on the left leg although there is an irritated part on the posterior which is like a wrap injury. The wound on the right leg which was new from last week at least to [place] is a lot better. 05/05/17; still no open area on the left leg. Patient is using his new compression stocking which seems to be doing a good job of controlling the edema. He states he  is using his compression pumps once per day. The right leg still has an open wound although it is better in terms of surface area. Required debridement. A lot of pain in the posterior right Achilles marked tenderness. Usually this type of presentation this patient gives concern for an active cellulitis 05/12/17; patient arrives today with his major wound from last week on the right lateral leg somewhat better. Still requiring debridement. He was using his compression stocking on the left leg however that is reopened with superficial wounds anteriorly he did not have an open wound on this leg previously. He is still using his juxta light's once daily at night. He cannot find the time to do this in the morning as he has to be at work by 7 AM 05/19/17; right lateral leg wound looks improved. No debridement required. The concerning area is on the left posterior leg which appears to almost have a subcutaneous hemorrhagic component to it. We've been using silver alginate to all the wounds 05/26/17; the right lateral leg wound continues to look improved. However the area on the left posterior calf is a tightly adherent surface. [place] using silver alginate. Because of the weeping edema in his legs there is very little good alternatives. 06/02/17; the patient left  here last week looking quite good. Major wound on the left posterior calf and a small one on the right lateral calf. Both of these look satisfactory. He tells me that by Wednesday he had noted increased pain in the left leg and drainage. He called on Thursday and Friday to get an appointment here but we were blocked. He did not go to urgent care or his primary physician. He thinks he had a fever on Thursday but did not actually take his temperature. He has not been using his compression pumps on the left leg because of pain. I advised him to go to the emergency room today for IV antibiotics for stents of left leg cellulitis but he has refused I have asked him to take 2 days off work to keep his leg elevated and he has refused this as well. In view of this I'm going to call him and Augmentin and doxycycline. 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 However after a detailed discussion regarding the risk benefits and alternatives he refuses to get admitted to the hospital. With no other recourse I will continue him on oral antibiotics as before and hopefully he'll have his infectious disease consultation this week. 06/16/2017 -- the patient was seen today by the nurse practitioner at infectious disease Ms. Dixon. 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 He has also been advise Lotrimin cream locally. she also had wise IV antibiotics for his condition if it flares up 06/23/17; patient arrives today with drainage bilaterally although the remaining wound on the left posterior calf after  cleaning up today "highlighter yellow drainage" did not look too bad. Unfortunately he has had breakdown on the right anterior leg [previously this leg had not been open and he is using a black stocking] he went  to see infectious disease and is been put on clindamycin 150 mg daily, I did not verify the dose although I'm not familiar with using clindamycin in this dosing range, perhaps for prophylaxisoo 06/27/17; I brought this patient back today to follow-up on the wound deterioration on the right lower leg together with surrounding cellulitis. I started him on doxycycline 4 days ago. This area looks better however he comes in today with intense cellulitis on the medial part of his left thigh. This is not have a wound in this area. Extremely tender. We've been using silver alginate to the wounds on the right lower leg left lower leg with bilateral 4 layer compression he is using his external compression pumps once a day 07/04/17; patient's left medial thigh cellulitis looks better. He has not been using his compression pumps as his insert said it was contraindicated with cellulitis. His right leg continues to make improvements all the wounds are still open. We only have one remaining wound on the left posterior calf. Using silver alginate to all open areas. He is on doxycycline which I started a week ago and should be finishing I gave him Augmentin after Thursday's visit for the severe cellulitis on the left medial thigh which fortunately looks better 07/14/17; the patient's left medial thigh cellulitis has resolved. The cellulitis in his right lower calf on the right also looks better. All of his wounds are stable to improved we've been using silver alginate he has completed the antibiotics I have given him. He has clindamycin 150 mg once a day prescribed by infectious disease for prophylaxis, I've advised him to start this now. We have been using bilateral Unna boots over silver alginate to the wound  areas 07/21/17; the patient is been to see infectious disease who noted his recurrent problems with cellulitis. He was not able to tolerate prophylactic clindamycin therefore he is on amoxicillin 500 twice a day. He also had a second daily dose of Lasix added By Dr. Oneta Rack but he is not taking this. Nor is he being completely compliant with his compression pumps a especially not this week. He has 2 remaining wounds one on the right posterior lateral lower leg and one on the left posterior medial lower leg. 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 The patient has Unna boots bilaterally. 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 He has bilateral Unna boots for compression and silver alginate to his wounds. Arrives today with his legs looking as good as I have seen him in quite some time. Not surprisingly his wounds look better as well with improvement on the right lateral leg venous insufficiency wound and also the left medial leg. He is still using the compression pumps once a day 08/11/17; both legs appear to be doing better wounds on the right lateral and left medial legs look better. Skin on the right leg quite good. He is been using silver alginate as the primary dressing. I'm going to use Anasept gel calcium alginate and maintain all the secondary dressings 08/18/17; the patient continues to actually do quite well. The area on his right lateral leg is just about closed the left medial also looks better although it is still moist in this area. His edema is well controlled we have been using Anasept gel  with calcium alginate and the usual secondary dressings, 4 layer compression and once daily use of his compression pumps "always been able to manage 09/01/17; the patient  continues to do reasonably well in spite of his trip to [place]. The area on the right lateral leg is epithelialized. Left is much better but still open. He has more edema and more chronic erythema on the left leg [venous inflammation] 09/08/17; he arrives today with no open wound on the right lateral leg and decently controlled edema. Unfortunately his left leg is not nearly as in his good situation as last week.he apparently had increasing edema starting on Saturday. He edema soaked through into his foot so used a plastic bag to walk around his home. The area on the medial right leg which was his open area is about the same however he has lost surface epithelium on the left lateral which is new and he has significant pain in the Achilles area of the left foot. He is already on amoxicillin chronically for prophylaxis of cellulitis in the left leg 09/15/17; he is completed a week of doxycycline and the cellulitis in the left posterior leg and Achilles area is as usual improved. He still has a lot of edema and fluid soaking through his dressings. There is no open wound on the right leg. He saw infectious disease NP today 09/22/17;As usual 1 we transition him from our compression wraps to his stockings things did not go well. He has several small open areas on the right leg. He states this was caused by the compression wrap on his skin although he did not wear this with the stockings over them. He has several superficial areas on the left leg medially laterally posteriorly. He does not have any evidence of active cellulitis especially involving the left Achilles The patient is traveling from [facility] Saturday going to [facility]. He states he isn't attempting to get an appointment with a heel objects wound center there to change his dressings. I am not completely certain whether this will work 10/06/17; the patient came in on Friday for a nurse visit and the nurse reported that his legs actually look  quite good. He arrives in clinic today for his regular follow-up visit. He has a new wound on his left third toe over the PIP probably caused by friction with his footwear. He has small areas on the left leg and a very superficial but epithelialized area on the right anterior lateral lower leg. Other than that his legs look as good as I've seen him in quite some time. We have been using silver alginate Review of systems; no chest pain no shortness of breath other than this a 10 point review of systems negative 10/20/17; seen by Dr. Meyer Russel last week. He had taken some antibiotics [doxycycline] that he had left over. Dr. Meyer Russel thought he had candida infection and declined to give him further antibiotics. He has a small wound remaining on the right lateral leg several areas on the left leg including a larger area on the left posterior several left medial and anterior and a small wound on the left lateral. The area on the left dorsal third toe looks a lot better. ROS; Gen.; no fever, respiratory no cough no sputum Cardiac no chest pain other than this 10 point review of system is negative 10/30/17; patient arrives today having fallen in the bathtub 3 days ago. It took him a while to get up. He has pain and maceration in the  wounds on his left leg which have deteriorated. He has not been using his pumps he also has some maceration on the right lateral leg. 11/03/17; patient continues to have weeping edema especially in the left leg. This saturates his dressings which were just put on on 12/27. As usual the doxycycline seems to take care of the cellulitis on his lower leg. He is not complaining of fever, chills, or other systemic symptoms. He states his leg feels a lot better on the doxycycline I gave him empirically. He also apparently gets injections at his primary doctor's officeo Rocephin for cellulitis prophylaxis. I didn't ask him about his compression pump compliance today I think that's probably  marginal. Arrives in the clinic with all of his dressings primary and secondary macerated full of fluid and he has bilateral edema 11/10/17; the patient's right leg looks some better although there is still a cluster of wounds on the right lateral. The left leg is inflamed with almost circumferential skin loss medially to laterally although we are still maintaining anteriorly. He does not have overt cellulitis there is a lot of drainage. He is not using compression pumps. We have been using silver alginate to the wound areas, there are not a lot of options here 11/17/17; the patient's right leg continues to be stable although there is still open wounds, better than last week. The inflammation in the left leg is better. Still loss of surface layer epithelium especially posteriorly. There is no overt cellulitis in the amount of edema and his left leg is really quite good, tells me he is using his compression pumps once a day. 11/24/17; patient's right leg has a small superficial wound laterally this continues to improve. The inflammation in the left leg is still improving however we have continuous surface layer epithelial loss posteriorly. There is no overt cellulitis in the amount of edema in both legs is really quite good. He states he is using his compression pumps on the left leg once a day for 5 out of 7 days 12/01/17; very small superficial areas on the right lateral leg continue to improve. Edema control in both legs is better today. He has continued loss of surface epithelialization and left posterior calf although I think this is better. We have been using silver alginate with large number of absorptive secondary dressings 4 layer on the left Unna boot on the right at his request. He tells me he is using his compression pumps once a day 12/08/17; he has no open area on the right leg is edema control is good here. On the left leg however he has marked erythema and tenderness breakdown of skin. He has  what appears to be a wrap injury just distal to the popliteal fossa. 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 There was swelling erythema and breakdown in the right leg. He has much worse skin breakdown in the left leg as well multiple open areas medially and posteriorly as well as laterally. He tells me he has been using his compression pumps but tells me he feels that the drainage out of his leg is worse when he uses a compression pumps. T be fair to him he is been saying this o for a  while however I don't know that I have really been listening to this. I wonder if the compression pumps are working properly 12/22/17;. Once again he arrives with severe erythema, weeping edema from the left greater than right leg. Noncompliance with compression pumps. New this visit he is complaining of pain on the lateral aspect of the right leg and the medial aspect of his right thigh. He apparently saw his cardiologist Dr. [name] who was ordered an echocardiogram area and I think this is a step in the right direction 12/25/17; started his doxycycline Monday night. There is still intense erythema of the right leg especially in the anterior thigh although there is less tenderness. The erythema around the wound on the right lateral calf also is less tender. He still complaining of pain in the left heel. His wounds are about the same right lateral left medial left lateral. Superficial but certainly not close to closure. He denies being systemically unwell no fever chills no abdominal pain no diarrhea 12/29/17; back in follow-up of his extensive right calf and right thigh cellulitis. I added amoxicillin to cover possible doxycycline resistant  strep. This seems to of done the trick he is in much less pain there is much less erythema and swelling. He has his echocardiogram at 11:00 this morning. X-ray of the left heel was also negative. 01/05/18; the patient arrived with his edema under much better control. Now that he is retired he is able to use his compression pumps daily and sometimes twice a day per the patient. He has a wound on the right leg the lateral wound looks better. Area on the left leg also looks a lot better. He has no evidence of cellulitis in his bilateral thighs I had a quick peak at his echocardiogram. He is in normal ejection fraction and normal left ventricular function. He has moderate pulmonary hypertension moderately reduced right ventricular function. One would have to wonder about chronic sleep apnea although he says he doesn't snore. He'll review the echocardiogram with his cardiologist. 01/12/18; the patient arrives with the edema in both legs under exemplary control. He is using his compression pumps daily and sometimes twice daily. His wound on the right lateral leg is just about closed. He still has some weeping areas on the posterior left calf and lateral left calf although everything is just about closed here as well. I have spoken with Aldean Baker who is the patient's nurse practitioner and infectious disease. She was concerned that the patient had not understood that the parenteral penicillin injections he was receiving for cellulitis prophylaxis was actually benefiting him. I don't think the patient actually saw that I would tend to agree we were certainly dealing with less infections although he had a serious one last month. 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 He is healed. He'll be placed in juxtalite compression wraps and increase his lymphedema pumps to twice daily. We will follow up again next week to ensure there are no issues with the new  regiment. 01/20/18-he is here for evaluation of bilateral lower extremity weeping edema. Yesterday he was placed in compression wrap to the right lower extremity and compression stocking to left lower shrubbery. He states he uses lymphedema pumps last night and again this morning and noted a blister to the left lower extremity. On exam he was noted to have drainage to the right lower extremity. He will be placed in Unna boots bilaterally and follow-up next week 01/26/18; patient was  actually discharged a week ago to his own juxta light stockings only to return the next day with bilateral lower extremity weeping edema.he was placed in bilateral Unna boots. He arrives today with pain in the back of his left leg. There is no open area on the right leg however there is a linear/wrap injury on the left leg and weeping edema on the left leg posteriorly. I spoke with infectious disease about 10 days ago. They were disappointed that the patient elected to discontinue prophylactic intramuscular penicillin shots as they felt it was particularly beneficial in reducing the frequency of his cellulitis. I discussed this with the patient today. He does not share this view. He'll definitely need antibiotics today. Finally he is traveling to [place] and trauma leaving this Saturday and returning a week later and he does not travel with his pumps. He is going by car 01/30/18; patient was seen 4 days ago and brought back in today for review of cellulitis in the left leg posteriorly. I put him on amoxicillin this really hasn't helped as much as I might like. He is also worried because he is traveling to [facility] trauma by car. Finally we will be rewrapping him. There is no open area on the right leg over his left leg has multiple weeping areas as usual 02/09/18; The same wrap on for 10 days. He did not pick up the last doxycycline I prescribed for him. He apparently took 4 days worth he already had. There is nothing open on  his right leg and the edema control is really quite good. He's had damage in the left leg medially and laterally especially probably related to the prolonged use of Unna boots 02/12/18; the patient arrived in clinic today for a nurse visit/wrap change. He complained of a lot of pain in the left posterior calf. He is taking doxycycline that I previously prescribed for him. Unfortunately even though he used his stockings and apparently used to compression pumps twice a day he has weeping edema coming out of the lateral part of his right leg. This is coming from the lower anterior lateral skin area. 02/16/18; the patient has finished his doxycycline and will finish the amoxicillin 2 days. The area of cellulitis in the left calf posteriorly has resolved. He is no longer having any pain. He tells me he is using his compression pumps at least once a day sometimes twice. 02/23/18; the patient finished his doxycycline and Amoxil last week. On Friday he noticed a small erythematous circle about the size of a quarter on the left lower leg just above his ankle. This rapidly expanded and he now has erythema on the lateral and posterior part of the thigh. This is bright red. Also has an area on the dorsal foot just above his toes and a tender area just below the left popliteal fossa. He came off his prophylactic penicillin injections at his own insistence one or 2 months ago. This is obviously deteriorated since then 03/02/18; patient is on doxycycline and Amoxil. Culture I did last week of the weeping area on the back of his left calf grew group B strep. I have therefore renewed the amoxicillin 500 3 times a day for a further week. He has not been systemically unwell. Still complaining of an area of discomfort right under his left popliteal fossa. There is no open wound on the right leg. He tells me that he is using his pumps twice a day on most days 03/09/18; patient arrives in  clinic today completing his amoxicillin  today. The cellulitis on his left leg is better. Furthermore he tells me that he had intramuscular penicillin shots that his primary care office today. However he also states that the wrap on his right leg fell down shortly after leaving clinic last week. He developed a large blister that was present when he came in for a nurse visit later in the week and then he developed intense discomfort around this area.He tells me he is using his compression pumps 03/16/18; the patient has completed his doxycycline. The infectious part of this/cellulitis in the left heel area left popliteal area is a lot better. He has 2 open areas on the right calf. Still areas on the left calf but this is a lot better as well. 03/24/18; the patient arrives complaining of pain in the left popliteal area again. He thinks some of this is wrap injury. He has no open area on the right leg and really no open area on the left calf either except for the popliteal area. He claims to be compliant with the compression pumps 03/31/18; I gave him doxycycline last week because of cellulitis in the left popliteal area. This is a lot better although the surface epithelium is denuded off and response to this. He arrives today with uncontrolled edema in the right calf area as well as a fingernail injury in the right lateral calf. There is only a few open areas on the left 04/06/18; I gave him amoxicillin doxycycline over the last 2 weeks that the amoxicillin should be completing currently. He is not complaining of any pain or systemic symptoms. The only open areas see has is on the right lateral lower leg paradoxically I cannot see anything on the left lower leg. He tells me he is using his compression pumps twice a day on most days. Silver alginate to the wounds that are open under 4 layer compression 04/13/18; he completed antibiotics and has no new complaints. Using his compression pumps. Silver alginate that anything that's opened 04/20/18; he is  using his compression pumps religiously. Silver alginate 4 layer compression anything that's opened. He comes in today with no open wounds on the left leg but 3 on the right including a new one posteriorly. He has 2 on the right lateral and one on the right posterior. He likes Unna boots on the right leg for reasons that aren't really clear we had the usual 4 layer compression on the left. It may be necessary to move to the 4 layer compression on the right however for now I left them in the Unna boots 04/27/18; he is using his compression pumps at least once a day. He has still the wounds on the right lateral calf. The area right posteriorly has closed. He does not have an open wound on the left under 4 layer compression however on the dorsal left foot just proximal to the toes and the left third toe 2 small open areas were identified 05/11/18; he has not uses compression pumps. The areas on the right lateral calf have coalesced into one large wound necrotic surface. On the left side he has one small wound anteriorly however the edema is now weeping out of a large part of his left leg. He says he wasn't using his pumps because of the weeping fluid. I explained to him that this is the time he needs to pump more 05/18/18; patient states he is using his compression pumps twice a day. The area on  the right lateral large wound albeit superficial. On the left side he has innumerable number of small new wounds on the left calf particularly laterally but several anteriorly and medially. All these appear to have healthy granulated base these look like the remnants of blisters however they occurred under compression. The patient arrives in clinic today with his legs somewhat better. There is certainly less edema, less multiple open areas on the left calf and the right anterior leg looks somewhat better as well superficial and a little smaller. However he relates pain and erythema over the last 3-4 days in the thigh  and I looked at this today. He has not been systemically unwell no fever no chills no change in blood sugar values 05/25/18; comes in today in a better state. The severe cellulitis on his left leg seems better with the Keflex. Not as tender. He has not been systemically unwell Hard to find an open wound on the left lower leg using his compression pumps twice a day The confluent wounds on his right lateral calf somewhat better looking. These will ultimately need debridement I didn't do this today. 06/01/18; the severe cellulitis on the left anterior thigh has resolved and he is completed his Keflex. There is no open wound on the left leg however there is a superficial excoriation at the base of the third toe dorsally. Skin on the bottom of his left foot is macerated looking. The left the wounds on the lateral right leg actually looks some better although he did require debridement of the top half of this wound area with an open curet 06/09/18 on evaluation today patient appears to be doing poorly in regard to his right lower extremity in particular this appears to likely be infected he has very thick purulent discharge along with a bright green tent to the discharge. This makes me concerned about the possibility of pseudomonas. He's also having increased discomfort at this point on evaluation. Fortunately there does not appear to be any evidence of infection spreading to the other location at this time. 06/16/18 on evaluation today patient appears to actually be doing fairly well. His ulcer has actually diminished in size quite significantly at this point which is good news. Nonetheless he still does have some evidence of infection he did see infectious disease this morning before coming here for his appointment. I did review the results of their evaluation and their note today. They did actually have him discontinue the Cipro and initiate treatment with linezolid at this time. He is doing this for the  next seven days and they recommended a follow-up in four months with them. He is the keep a log of the need for intermittent antibiotic therapy between now and when he falls back up with infectious disease. This will help them gaze what exactly they need to do to try and help them out. 06/23/18; the patient arrives today with no open wounds on the left leg and left third toe healed. He is been using his compression pumps twice a day. On the right lateral leg he still has a sizable wound but this is a lot better than last time I saw this. 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 Has been using silver alginate under 4 layer compression 06/30/18; the only open wound he has is on the right lateral leg and this looks healthy. No debridement is required. We have  been using silver alginate. He does not have an open wound on the left leg. There is apparently some drainage from the dorsal proximal third toe on the left although I see no open wound here. 07/03/18 on evaluation today patient was actually here just for a nurse visit rapid change. However when he was here on Wednesday for his rat change due to having been healed on the left and then developing blisters we initiated the wrap again knowing that he would be back today for [place] to reevaluate and see were at. Unfortunately he has developed some cellulitis into the proximal portion of his right lower extremity even into the region of his thigh. 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 He was placed on one as what at that point. Nonetheless he is done with that and has been tolerating it well otherwise. Doxycycline which in the past really did not seem to be effective for him. Nonetheless I think the best option may be for [place] to definitely reinitiate the antibiotics for a longer period of time. 07/07/18; since I  last saw this patient a week ago he has had a difficult time. At that point he did not have an open wound on his left leg. We transitioned him into juxta light stockings. He was apparently in the clinic the next day with blisters on the left lateral and left medial lower calf. He also had weeping edema fluid. He was put back into a compression wrap. He was also in the clinic on Friday with intense erythema in his right thigh. Per the patient he was started on Bactrim however that didn't work at all in terms of relieving his pain and swelling. He has taken 3 doxycycline that he had left over from last time and that seems to of helped. He has blistering on the right thigh as well. 07/14/18; the erythema on his right thigh has gotten better with doxycycline that he is finishing. 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 Presumably this cellulitis in the thigh was not related to that although I think this is a bit concerning going forward. He still has an area on the right lateral calf the blister on the right medial calf just below the knee that was discussed above. On the left 2 small open areas left medial and left lateral. Edema control is adequate. He is using his compression pumps twice a day 07/20/18; continued improvement in the condition of both legs especially the edema in his bilateral thighs. He tells me he is been losing weight through a combination of diet and exercise. He is using his compression pumps twice a day. So overall she made to the remaining wounds 07/27/2018; continued improvement in condition of both legs. His edema is well controlled. The area on the right lateral leg is just about closed he had one blisters show up on the medial left upper calf. We have him in 4 layer compression. He is going on a 10-day trip to [place], [place] and [place]. He will be driving. He wants to wear Unna boots because of  the lessening amount of constriction. He will not use compression pumps while he is away 08/05/18 on evaluation today patient actually appears to be doing decently well all things considered in regard to his bilateral lower extremities. The worst ulcer is actually only posterior aspect of  his left lower extremity with a four layer compression wrap cut into his leg a couple weeks back. He did have a trip and actually had [employer] for the trip that he is worn since he was last here. Nonetheless he feels like the [employer] actually do better for him his swelling is up a little bit but he also with his trip was not taking his Lasix on a regular set schedule like he was supposed to be. He states that obviously the reason being that he cannot drive and keep going without having to urinate too frequently which makes it difficult. He did not have his pumps with him while he was away either which I think also maybe playing a role here too. 08/13/2018; the patient only has a small open wound on the right lateral calf which is a big improvement in the last month or 2. He also has the area posteriorly just below the posterior fossa on the left which I think was a wrap injury from several weeks ago. He has no current evidence of cellulitis. He tells me he is back into his compression pumps twice a day. He also tells me that while he was at the laundromat somebody stole a section of his extremitease stockings 08/20/2018; back in the clinic with a much improved state. He only has small areas on the right lateral mid calf which is just about healed. This was is more substantial area for quite a prolonged period of time. He has a small open area on the left anterior tibia. The area on the posterior calf just below the popliteal fossa is closed today. He is using his compression pumps twice a day 08/28/2018; patient has no open wound on the right leg. He has a smattering of open areas on the calf with some  weeping lymphedema. More problematically than that it looks as though his wraps of slipped down in his usual he has very angry upper area of edema just below the right medial knee and on the right lateral calf. He has no open area on his feet. The patient is traveling to [facility] next week. I will send him in an antibiotic. We will continue to wrap the right leg. We ordered extremitease stockings for him last week and I plan to transition the right leg to a stocking when he gets home which will be in 10 days time. As usual he is very reluctant to take his pumps with him when he travels 09/07/2018; patient returns from [facility]. He shows me a picture of his left leg in the mid part of his trip last week with intense fire engine erythema. The picture look bad enough I would have considered sending him to the hospital. Instead he went to the wound care center in [facility]. They did not prescribe him antibiotics but he did take some doxycycline he had leftover from a previous visit. I had given him trimethoprim sulfamethoxazole before he left this did not work according to the patient. This is resulted in some improvement fortunately. He comes back with a large wound on the left posterior calf. Smaller area on the left anterior tibia. Denuded blisters on the dorsal left foot over his toes. Does not have much in the way of wounds on the right leg although he does have a very tender area on the right posterior area just below the popliteal fossa also suggestive of infection. He promises me he is back on his  pumps twice a day 09/15/2018; the intense cellulitis in his left lower calf is a lot better. The wound area on the posterior left calf is also so better. However he has reasonably extensive wounds on the dorsal aspect of his second and third toes and the proximal foot just at the base of the toes. There is nothing open on the right leg 09/22/2018; the patient has excellent edema  control in his legs bilaterally. He is using his external compression pumps twice a day. He has no open area on the right leg and only the areas in the left foot dorsally second and third toe area on the left side. He does not have any signs of active cellulitis. 10/06/2018; the patient has good edema control bilaterally. He has no open wound on the right leg. There is a blister in the posterior aspect of his left calf that we had to deal with today. He is using his compression pumps twice a day. There is no signs of active cellulitis. We have been using silver alginate to the wound areas. He still has vulnerable areas on the base of his left first second toes dorsally He has a his extremities stockings and we are going to transition him today into the stocking on the right leg. He is cautioned that he will need to continue to use the compression pumps twice a day. If he notices uncontrolled edema in the right leg he may need to go to 3 times a day. 10/13/2018; the patient came in for a nurse check on Friday he has a large flaccid blister on the right medial calf just below the knee. We unroofed this. He has this and a new area underneath the posterior mid calf which was undoubtedly a blister as well. He also has several small areas on the right which is the area we put his extremities stocking on. 10/19/2018; the patient went to see infectious disease this morning I am not sure if that was a routine follow-up in any case the doxycycline I had given him was discontinued and started on linezolid. He has not started this. It is easy to look at his left calf and the inflammation and think this is cellulitis however he is very tender in the tissue just below the popliteal fossa and I have no doubt that there is infection going on here. He states the problem he is having is that with the compression pumps the edema goes down and then starts walking the wrap falls down. We will see if we can adhere this. He  has 1 or 2 minuscule open areas on the right still areas that are weeping on the posterior left calf, the base of his left second and third toes 10/26/18; back today in clinic with quite of skin breakdown in his left anterior leg. This may have been infection the area below the popliteal fossa seems a lot better however tremendous epithelial loss on the left anterior mid tibia area over quite inexpensive tissue. He has 2 blisters on the right side but no other open wound here. 10/29/2018; came in urgently to see [place] today and we worked him in for review. He states that the 4 layer compression on the right leg caused pain he had to cut it down to roughly his mid calf this caused swelling above the wrap and he has blisters and skin breakdown today. As a result of the pain he has not been using his pumps. Both legs are a lot more  edematous and there is a lot of weeping fluid. 11/02/18; arrives in clinic with continued difficulties in the right leg> left. Leg is swollen and painful. multiple skin blisters and new open areas especially laterally. He has not been using his pumps on the right leg. 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 He is not systemically unwell. 11/09/2018; the patient claims he is being compliant with his pumps. He is finished the doxycycline I gave him last week. Culture I did of the wound on the right lateral leg showed a few very resistant methicillin staph aureus. This was resistant to doxycycline. Nevertheless he states the pain in the leg is a lot better which makes me wonder if the cultured organism was not really what was causing the problem nevertheless this is a very dangerous organism to be culturing out of any wound. His right leg is still a lot larger than the left. He is using an Radio broadcast assistant on this area, he blames a 4-layer compression for causing the original skin breakdown which I doubt is true however I cannot talk him out  of it. We have been using silver alginate to all of these areas which were initially blisters 11/16/2018; patient is being compliant with his external compression pumps at twice a day. Miraculously he arrives in clinic today with absolutely no open wounds. He has better edema control on the left where he has been using 4 layer compression versus wound of wounds on the right and I pointed this out to him. There is no inflammation in the skin in his lower legs which is also somewhat unusual for him. There is no open wounds on the dorsal left foot. He has extremitease stockings at home and I have asked him to bring these in next week. 11/25/18 patient's lower extremity on examination today on the left appears for the most part to be wound free. He does have an open wound on the lateral aspect of the right lower extremity but this is minimal compared to what I've seen in past. He does request that we go ahead and wrap the left leg as well even though there's nothing open just so hopefully it will not reopen in short order. 1/28; patient has superficial open wounds on the right lateral calf left anterior calf and left posterior calf. His edema control is adequate. 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 We have been using silver alginate as the primary dressing. He claims compliance with his compression pumps 2/4; patient has superficial open wounds on numerous areas of his left calf and again one on the left dorsal foot. The areas on the right lateral calf have healed. The cellulitis that I gave him doxycycline for last week is also resolved this was mostly on the left anterior calf just below the tibial tuberosity. His edema looks fairly well-controlled. He tells me he went to see his primary doctor today and had blood work ordered 2/11; once again he has several open areas on the left calf left tibial area. Most of  these are small and appear to have healthy granulation. He does not have anything open on the right. The edema and control in his thighs is pretty good which is usually a good indication he has been using his pumps as requested. 2/18; he continues to have several small areas on the left calf and left tibial  area. Most of these are small healthy granulation. We put him in his stocking on the right leg last week and he arrives with a superficial open area over the right upper tibia and a fairly large area on the right lateral tibia in similar condition. His edema control actually does not look too bad, he claims to be using his compression pumps twice a day 2/25. Continued small areas on the left calf and left tibial area. New areas especially on the right are identified just below the tibial tuberosity and on the right upper tibia itself. There are also areas of weeping edema fluid even without an obvious wound. He does not have a considerable degree of lymphedema but clearly there is more edema here than his skin can handle. He states he is using the pumps twice a day. We have an Unna boot on the right and 4 layer compression on the left. 3/3; he continues to have an area on the right lateral calf and right posterior calf just below the popliteal fossa. There is a fair amount of tenderness around the wound on the popliteal fossa but I did not see any evidence of cellulitis, could just be that the wrap came down and rubbed in this area. He does not have an open area on the left leg however there is an area on the left dorsal foot at the base of the third toe We have been using silver alginate to all wound areas 3/10; he did not have an open area on his left leg last time he was here a week ago. T oday he arrives with a horizontal wound just below the tibial tuberosity and an area on the left lateral calf. He has intense erythema and tenderness in this area. The area is on the right lateral calf and  right posterior calf better than last week. We have been using silver alginate as usual 3/18 - Patient returns with 3 small open areas on left calf, and 1 small open area on right calf, the skin looks ok with no significant erythema, he continues the UNA boot on right and 4 layer compression on left. The right lateral calf wound is closed , the right posterior is small area. we will continue silver alginate to the areas. 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 He is been tolerating the dressing changes without complication. Fortunately she has made excellent improvement in regard to the overall status of his wounds. Unfortunately every time we cease wrapping him he ends up reopening in causing more significant issues at that point. Again I'm unsure of the best direction to take although I think the lymphedema clinic may be appropriate for him. 02/03/19 on evaluation today patient appears to be doing well in regard to the wounds that we saw him for last week unfortunately he has a new area on the proximal portion of his right medial/posterior lower extremity where the wrap somewhat slowed down and caused swelling and a blister to rub and open. Unfortunately this is the only opening that he has on either leg at this point. 02/17/19 on evaluation today patient's bilateral lower extremities appear to be doing well. He still completely healed in regard to the left lower extremity. In regard to the right lower extremity the area where the wrap and slid down  and caused the blister still seems to be slightly open although this is dramatically better than during the last evaluation two weeks ago. I'm very pleased with the way this stands overall. 03/03/19 on evaluation today patient appears to be doing well in regard  to his right lower extremity in general although he did have a new blister open this does not appear to be showing any evidence of active infection at this time. Fortunately there's No fevers, chills, nausea, or vomiting noted at this time. Overall I feel like he is making good progress it does feel like that the right leg will we perform the [employer] seems to do with a bit better than three layer wrap on the left which slid down on him. We may switch to doing bilateral in the book wraps. 5/4; I have not seen Mr. Goodenow in quite some time. According to our case manager he did not have an open wound on his left leg last week. He had 1 remaining wound on the right posterior medial calf. He arrives today with multiple openings on the left leg probably were blisters and/or wrap injuries from Unna boots. I do not think the Unna boot's will provide adequate compression on the left. I am also not clear about the frequency he is using the compression pumps. 03/17/19 on evaluation today patient appears to be doing excellent in regard to his lower extremities compared to last week's evaluation apparently. He had gotten significantly worse last week which is unfortunate. The [employer] wrap on the left did not seem to do very well for him at all and in fact it didn't control his swelling significantly enough he had an additional outbreak. Subsequently we go back to the four layer compression wrap on the left. This is good news. At least in that he is doing better and the wound seem to be killing him. He still has not heard anything from the lymphedema clinic. 03/24/19 on evaluation today patient actually appears to be doing much better in regard to his bilateral lower Trinity as compared to last week when I saw him. Fortunately there's no signs of active infection at this time. He has been tolerating the dressing changes without complication. Overall I'm extremely pleased with the progress and appearance in  general. 04/07/19 on evaluation today patient appears to be doing well in regard to his bilateral lower extremities. His swelling is significantly down from where it was previous. With that being said he does have a couple blisters still open at this point but fortunately nothing that seems to be too severe and again the majority of the larger openings has healed at this time. 04/14/19 on evaluation today patient actually appears to be doing quite well in regard to his bilateral lower extremities in fact I'm not even sure there's anything significantly open at this time at any site. Nonetheless he did have some trouble with these wraps where they are somewhat irritating him secondary to the fact that he has noted that the graph wasn't too close down to the end of this foot in a little bit short as well up to his knee. Otherwise things seem to be doing quite well. 04/21/19 upon evaluation today patient's wound bed actually showed evidence of being completely healed in regard to both lower extremities which is excellent news. There does not appear to be any signs of active infection which is also good news. I'm very pleased in this regard. No fevers, chills, nausea, or vomiting noted  at this time. 04/28/19 on evaluation today patient appears to be doing a little bit worse in regard to both lower extremities on the left mainly due to the fact that when he went infection disease the wrap was not wrapped quite high enough he developed a blister above this. On the right he is a small open area of nothing too significant but again this is continuing to give him some trouble he has been were in the Velcro compression that he has at home. 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 He's been tolerating the dressing changes without complication. Fortunately there's no signs of active infection at this time. No fevers, chills, nausea, or vomiting noted at  this time. We have been trying to get an appointment with her lymphedema clinic in [facility] but unfortunately nobody can get them on phone with not been able to even fax information over the patient likewise is not been able to get in touch with them. Overall I'm not sure exactly what's going on here with to reach out again today. 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 Still having a lot of drainage unfortunately. He tells me especially in the left but even on the right. There's no signs of active infection which is good news we've been using so ratcheted up to this point. 05/19/19 on evaluation today patient actually appears to be doing quite well with regard to his left lower extremity which is great news. Fortunately in regard to the right lower extremity has an issues with his wrap and he subsequently did remove this from what I'm understanding. Nonetheless long story short is what he had rewrapped once he removed it subsequently had maggots underneath this wrap whenever he came in for evaluation today. With that being said they were obviously completely cleaned away by the nursing staff. The visit today which is excellent news. However he does appear to potentially have some infection around the right ankle region where the maggots were located as well. He will likely require anabiotic therapy today. 05/26/19 on evaluation today patient actually appears to be doing much better in regard to his bilateral lower extremities. I feel like the infection is under much better control. With that being said there were maggots noted when the wrap was removed yet again today. Again this could have potentially been left over from previous although at this time there does not appear to be any signs of significant drainage there was obviously on the wrap some drainage as well this contracted gnats or  otherwise. Either way I do not see anything that appears to be doing worse in my pinion and in fact I think his drainage has slowed down quite significantly likely mainly due to the fact to his infection being under better control. 06/02/2019 on evaluation today patient actually appears to be doing well with regard to his bilateral lower extremities there is no signs of active infection at this time which is great news. With that being said he does have several open areas more so on the right than the left but nonetheless these are all significantly better than previously noted. 06/09/2019 on evaluation today patient actually appears to be doing well. His wrap stayed up and he did not cause any problems he had more drainage on the right compared to the left but overall I do not see  any major issues at this time which is great news. 06/16/2019 on evaluation today patient appears to be doing excellent with regard to his lower extremities the only area that is open is a new blister that can have opened as of today on the medial ankle on the left. Other than this he really seems to be doing great I see no major issues at this point. 06/23/2019 on evaluation today patient appears to be doing quite well with regard to his bilateral lower extremities. In fact he actually appears to be almost completely healed there is a small area of weeping noted of the right lower extremity just above the ankle. Nonetheless fortunately there is no signs of active infection at this time which is good news. No fevers, chills, nausea, vomiting, or diarrhea. 8/24; the patient arrived for a nurse visit today but complained of very significant pain in the left leg and therefore I was asked to look at this. Noted that he did not have an open area on the left leg last week nevertheless this was wrapped. The patient states that he is not been able to put his compression pumps on the left leg because of the discomfort. He has not been  systemically unwell 06/30/2019 on evaluation today patient unfortunately despite being excellent last week is doing much worse with regard to his left lower extremity today. In fact he had to come in for a nurse on Monday where his left leg had to be rewrapped due to excessive weeping Dr. Leanord Hawking placed him on doxycycline at that point. Fortunately there is no signs of active infection Systemically at this time which is good news. 07/07/2019 in regard to the patient's wounds today he actually seems to be doing well with his right lower extremity there really is nothing open or draining at this point this is great news. Unfortunately the left lower extremity is given him additional trouble at this time. There does not appear to be any signs of active infection nonetheless he does have a lot of edema and swelling noted at this point as well as blistering all of which has led to a much more poor appearing leg at this time compared to where it was 2 weeks ago when it was almost completely healed. Obviously this is a little discouraging for the patient. He is try to contact the lymphedema clinic in [place] he has not been able to get through to them. 07/14/2019 on evaluation today patient actually appears to be doing slightly better with regard to his left lower extremity ulcers. Overall I do feel like at least at the top of the wrap that we have been placing this area has healed quite nicely and looks much better. The remainder of the leg is showing signs of improvement. Unfortunately in the thigh area he still has an open region on the left and again on the right he has been utilizing just a Band-Aid on an area that also opened on the thigh. Again this is an area that were not able to wrap although we did do an Ace wrap to provide some compression that something that obviously is a little less effective than the compression wraps we have been using on the lower portion of the leg. He does have an  appointment with the lymphedema clinic in [facility] on Friday. 07/21/2019 on evaluation today patient appears to be doing better with regard to his lower extremity ulcers. He has been tolerating the dressing changes without complication. Fortunately there is  no signs of active infection at this time. No fevers, chills, nausea, vomiting, or diarrhea. I did receive the paperwork from the physical therapist at the lymphedema clinic in [place]. Subsequently I signed off on that this morning and sent that back to him for further progression with the treatment plan. 07/28/2019 on evaluation today patient appears to be doing very well with regard to his right lower extremity where I do not see any open wounds at this point. Fortunately he is feeling great as far as that is concerned as well. In regard to the left lower extremity he has been having issues with still several areas of weeping and edema although the upper leg is doing better his lower leg still I think is going require the compression wrap at this time. No fevers, chills, nausea, vomiting, or diarrhea. 08/04/2019 on evaluation today patient unfortunately is having new wounds on the right lower extremity. Again we have been using Unna boot wrap on that side. We switched him to using his juxta lite wrap at home. With that being said he tells me he has been using it although his legs extremely swollen and to be honest really does not appear that he has been. I cannot know that for sure however. Nonetheless he has multiple new wounds on the right lower extremity at this time. Obviously we will have to see about getting this rewrapped for him today. 08/11/2019 on evaluation today patient appears to be doing fairly well with regard to his wounds. He has been tolerating the dressing changes including the compression wraps without complication. He still has a lot of edema in his upper thigh regions bilaterally he is supposed to be seeing the  lymphedema clinic on the 15th of this month once his wraps arrive for the upper part of his legs. 08/18/2019 on evaluation today patient appears to be doing well with regard to his bilateral lower extremities at this point. He has been tolerating the dressing changes without complication. Fortunately there is no signs of active infection which is also good news. He does have a couple weeping areas on the first and second toe of the right foot he also has just a small area on the left foot upper leg and a small area on the left lower leg but overall he is doing quite well in my opinion. He is supposed to be getting his wraps shortly in fact tomorrow and then subsequently is seeing the lymphedema clinic next Wednesday on the 21st. Of note he is also leaving on the 25th to go on vacation for a [place] to the beach. For that reason and since there is some uncertainty about what there can be doing at lymphedema clinic next Wednesday I am get a make an appointment for next Friday here for [place] to see what we need to do for him prior to him leaving for vacation. 10/23; patient arrives in considerable pain predominantly in the upper posterior calf just distal to the popliteal fossa also in the wound anteriorly above the major wound. This is probably cellulitis and he has had this recurrently in the past. He has no open wound on the right side and he has had an Radio broadcast assistant in that area. Finally I note that he has an area on the left posterior calf which by enlarge is mostly epithelialized. This protrudes beyond the borders of the surrounding skin in the setting of dry scaly skin and lymphedema. The patient is leaving for [place] on Sunday. Per  his longstanding pattern, he will not take his compression pumps with him predominantly out of fear that they will be stolen. He therefore asked that we put a Unna boot back on the right leg. He will also contact the wound care center in [facility] to see if they can  change his dressing in the mid week. 11/3; patient returned from his vacation to [facility]. He was seen on 1 occasion at their wound care center. They did a 2 layer compression system as they did not have our 4-layer wrap. I am not completely certain what they put on the wounds. They did not change the Unna boot on the right. The patient is also seeing a lymphedema specialist physical therapist in [place]. It appears that he has some compression sleeve for his thighs which indeed look quite a bit better than I am used to seeing. He pumps over these with his external compression pumps. 11/10; the patient has a new wound on the right medial thigh otherwise there is no open areas on the right. He has an area on the left leg posteriorly anteriorly and medially and an area over the left second toe. We have been using silver alginate. He thinks the injury on his thigh is secondary to friction from the compression sleeve he has. 11/17; the patient has a new wound on the right medial thigh last week. He thinks this is because he did not have a underlying stocking for his thigh juxta lite apparatus. He now has this. The area is fairly large and somewhat angry but I do not think he has underlying cellulitis. He has a intact blister on the right anterior tibial area. Small wound on the right great toe dorsally Small area on the medial left calf. 11/30; the patient does not have any open areas on his right leg and we did not take his juxta lite stocking off. However he states that on Friday his compression wrap fell down lodging around his upper mid calf area. As usual this creates a lot of problems for him. He called urgently today to be seen for a nurse visit however the nurse visit turned into a provider visit because of extreme erythema and pain in the left anterior tibia extending laterally and posteriorly. The area that is problematic is extensive 10/06/2019 upon evaluation today patient actually  appears to be doing poorly in regard to his left lower extremity. He Dr. Leanord Hawking did place him on doxycycline this past Monday apparently due to the fact that he was doing much worse in regard to this left leg. Fortunately the doxycycline does seem to be helping. Unfortunately we are still having a very difficult time getting his edema under any type of control in order to anticipate discharge at some point. The only way were really able to control his lymphedema really is with compression wraps and that has only even seemingly temporary. He has been seeing a lymphedema clinic they are trying to help in this regard but still this has been somewhat frustrating in general for the patient. 10/13/19 on evaluation today patient appears to be doing excellent with regard to his right lower extremity as far as the wounds are concerned. His swelling is still quite extensive unfortunately. He is still having a lot of drainage from the thigh areas bilaterally which is unfortunate. He's been going to lymphedema clinic but again he still really does not have this edema under control as far as his lower extremities are concern. With regard  to his left lower extremity this seems to be improving and I do believe the doxycycline has been of benefit for him. He is about to complete the doxycycline. 10/20/2019 on evaluation today patient appears to be doing poorly in regard to his bilateral lower extremities. More in the right thigh he has a lot of irritation at this site unfortunately. In regard to the left lower extremity the wrap was not quite as high it appears and does seem to have caused him some trouble as well. Fortunately there is no evidence of systemic infection though he does have some blue-green drainage which has me concerned for the possibility of Pseudomonas. He tells me he is previously taking Cipro without complications and he really does not care for Levaquin however due to some of the side effects he  has. He is not allergic to any medications specifically antibiotics that were aware of. 10/27/2019 on evaluation today patient actually does appear to be for the most part doing better when compared to last week's evaluation. With that being said he still has multiple open wounds over the bilateral lower extremities. He actually forgot to start taking the Cipro and states that he still has the whole bottle. He does have several new blisters on left lower extremity today I think I would recommend he go ahead and take the Cipro based on what I am seeing at this point. 12/30-Patient comes at 1 week visit, 4 layer compression wraps on the left and Unna boot on the right, primary dressing Xtrasorb and silver alginate. Patient is taking his Cipro and has a few more days left probably 5-6, and the legs are doing better. He states he is using his compressions devices which I believe he has 11/10/2019 on evaluation today patient actually appears to be much better than last time I saw him 2 weeks ago. His wounds are significantly improved and overall I am very pleased in this regard. Fortunately there is no signs of active infection at this time. He is just a couple of days away from completing Cipro. Overall his edema is much better he has been using his lymphedema pumps which I think is also helping at this point. 11/17/2019 on evaluation today patient appears to be doing excellent in regard to his wounds in general. His legs are swollen but not nearly as much as they have been in the past. Fortunately he is tolerating the compression wraps without complication. No fevers, chills, nausea, vomiting, or diarrhea. He does have some erythema however in the distal portion of his right lower extremity specifically around the forefoot and toes there is a little bit of warmth here as well. 11/24/2019 on evaluation today patient appears to be doing well with regard to his right lower extremity I really do not see any open  wounds at this point. His left lower extremity does have several open areas and his right medial thigh also is open. Other than this however overall the patient seems to be making good progress and I am very pleased at this point. 12/01/2019 on evaluation today patient appears to be doing poorly at this point in regard to his left lower extremity has several new blisters despite the fact that we have him in compression wraps. In fact he had a 4-layer compression wrap, his upper thigh wrapped from lymphedema clinic, and a juxta light over top of the 4 layer compression wrap the lymphedema clinic applied and despite all this he still develop blisters underneath. Obviously this does  have me concerned about the fact that unfortunately despite what we are doing to try to get wounds healed he continues to have new areas arise I do not think he is ever good to be at the point where he can realistically just use wraps at home to keep things under control. Typically when we heal him it takes about 1-2 days before he is back in the clinic with severe breakdown and blistering of his lower extremities bilaterally. This is happened numerous times in the past. Unfortunately I think that we may need some help as far as overall fluid overload to kind of limit what we are seeing and get things under better control. 12/08/2019 on evaluation today patient presents for follow-up concerning his ongoing bilateral lower extremity edema. Unfortunately he is still having quite a bit of swelling the compression wraps are controlling this to some degree but he did see Dr. Debara Pickett his cardiologist I do have that available for review today as far as the appointment was concerned that was on 12/06/2019. Obviously that she has been 2 days ago. The patient states that he is only been taking the Lasix 80 mg 1 time a day he had told me previously he was taking this twice a day. Nonetheless Dr. Debara Pickett recommended this be up to 80 mg 2 times a  day for the patient as he did appear to be fluid overloaded. With that being said the patient states he did this yesterday and he was unable to go anywhere or do anything due to the fact that he was constantly having to urinate. Nonetheless I think that this is still good to be something that is important for him as far as trying to get his edema under control at all things that he is going to be able to just expect his wounds to get under control and things to be better without going through at least a period of time where he is trying to stabilize his fluid management in general and I think increasing the Lasix is likely the first step here. It was also mentioned the possibility that the patient may require metolazone. With that being said he wanted to have the patient take Lasix twice a day first and then reevaluating 2 months to see where things stand. 12/15/2019 upon evaluation today patient appears to be doing regard to his legs although his toes are showing some signs of weeping especially on the left at this point to some degree on the right. There does not appear to be any signs of active infection and overall I do feel like the compression wraps are doing well for him but he has not been able to take the Lasix at home and the increased dose that Dr. Debara Pickett recommended. He tells me that just not go to be feasible for him. Nonetheless I think in this case he should probably send a message to Dr. Debara Pickett in order to discuss options from the standpoint of possible admission to get the fluid off or otherwise going forward. 12/22/2019 upon evaluation today patient appears to be doing fairly well with regard to his lower extremities at this point. In fact he would be doing excellent if it was not for the fact that his right anterior thigh apparently had an allergic reaction to adhesive tape that he used. The wound itself that we have been monitoring actually appears to be healed. There is a lot of  irritation at this point. 12/29/2019 upon evaluation today patient appears to  be doing well in regard to his lower extremities. His left medial thigh is open and somewhat draining today but this is the only region that is open the right has done much better with the treatment utilizing the steroid cream that I prescribed for him last week. Overall I am pleased in that regard. Fortunately there is no signs of active infection at this time. No fevers, chills, nausea, vomiting, or diarrhea. 01/05/2020 upon evaluation today patient appears to be doing more poorly in regard to his right lower extremity at this point upon evaluation today. Unfortunately he continues to have issues in this regard and I think the biggest issue is controlling his edema. This obviously is not very well controlled at this point is been recommended that he use the Lasix twice a day but he has not been able to do that. Unfortunately I think this is leading to an issue where honestly he is not really able to effectively control his edema and therefore the wounds really are not doing significantly better. I do not think that he is going to be able to keep things under good control unless he is able to control his edema much better. I discussed this again in great detail with him today. 01/12/2020 good news is patient actually appears to be doing quite well today at this point. He does have an appointment with lymphedema clinic tomorrow. His legs appear healed and the toe on the left is almost completely healed. In general I am very pleased with how things stand at this point. 01/19/2020 upon evaluation today patient appears to actually be doing well in regard to his lower extremities there is nothing open at this point. Fortunately he has done extremely well more recently. Has been seeing lymphedema clinic as well. With that being said he has Velcro wraps for his lower legs as well as his upper legs. The only wound really is on his toe  which is the right great toe and this is barely anything even there. With all that being said I think it is good to be appropriate today to go ahead and switch him over to the Velcro compression wraps. 01/26/2020 upon evaluation today patient appears to be doing worse with regard to his lower extremities after last week switch him to Velcro compression wraps. Unfortunately he lasted less than 24 hours he did not have the sock portion of his Velcro wrap on the left leg and subsequently developed a blister underneath the Velcro portion. Obviously this is not good and not what we were looking for at this point. He states the lymphedema clinic did tell him to wear the wrap for 23 hours and take him off for 1 I am okay with that plan but again right now we got a get things back under control again he may have some cellulitis noted as well. 02/02/2020 upon evaluation today patient unfortunately appears to have several areas of blistering on his bilateral lower extremities today mainly on the feet. His legs do seem to be doing somewhat better which is good news. Fortunately there is no evidence of active infection at this time. No fevers, chills, nausea, vomiting, or diarrhea. 02/16/2020 upon evaluation today patient appears to be doing well at this time with regard to his legs. He has a couple weeping areas on his toes but for the most part everything is doing better and does appear to be sealed up on his legs which is excellent news. We can continue with wrapping him  at this point as he had every time we discontinue the wraps he just breaks out with new wounds. There is really no point in is going forward with this at this point. 03/08/2020 upon evaluation today patient actually appears to be doing quite well with regard to his lower extremity ulcers. He has just a very superficial and really almost nonexistent blister on the left lower extremity he has in general done very well with the compression wraps. With  that being said I do not see any signs of infection at this time which is good news. 03/29/2020 upon evaluation today patient appears to be doing well with regard to his wounds currently except for where he had several new areas that opened up due to some of the wrap slipping and causing him trouble. He states he did not realize they had slipped. Nonetheless he has a 1 area on the right and 3 new areas on the left. Fortunately there is no signs of active infection at this time which is great news. 04/05/2020 upon evaluation today patient actually appears to be doing quite well in general in regard to his legs currently. Fortunately there is no signs of active infection at this time. No fevers, chills, nausea, vomiting, or diarrhea. He tells me next week that he will actually be seen in the lymphedema clinic on Thursday at 10 AM I see him on Wednesday next week. 04/12/2020 upon evaluation today patient appears to be doing very well with regard to his lower extremities bilaterally. In fact he does not appear to have any open wounds at this point which is good news. Fortunately there is no signs of active infection at this time. No fevers, chills, nausea, vomiting, or diarrhea. 04/19/2020 upon evaluation today patient appears to be doing well with regard to his wounds currently on the bilateral lower extremities. There does not appear to be any signs of active infection at this time. Fortunately there is no evidence of systemic infection and overall very pleased at this point. Nonetheless after I held him out last week he literally had blisters the next morning already which swelled up with him being right back here in the clinic. Overall I think that he is just not can be able to be discharged with his legs the way they are he is much to volume overloaded as far as fluid is concerned and that was discussed with him today of also discussed this but should try the clinic nurse manager as well as Dr.  Leanord Hawking. 04/26/2020 upon evaluation today patient appears to be doing better with regard to his wounds currently. He is making some progress and overall swelling is under good control with the compression wraps. Fortunately there is no evidence of active infection at this time. 05/10/2020 on evaluation today patient appears to be doing overall well in regard to his lower extremities bilaterally. He is Tolerating the compression wraps without complication and with what we are seeing currently I feel like that he is making excellent progress. There is no signs of active infection at this time. 05/24/2020 upon evaluation today patient appears to be doing well in regard to his legs. The swelling is actually quite a bit down compared to where it has been in the past. Fortunately there is no sign of active infection at this time which is also good news. With that being said he does have several wounds on his toes that have opened up at this point. 05/31/2020 upon evaluation today patient appears to  be doing well with regard to his legs bilaterally where he really has no significant fluid buildup at this point overall he seems to be doing quite well. Very pleased in this regard. With regard to his toes these also seem to be drying up which is excellent. We have continue to wrap him as every time we tried as a transition to the juxta light wraps things just do not seem to get any better. 06/07/2020 upon evaluation today patient appears to be doing well with regard to his right leg at this point. Unfortunately left leg has a lot of blistering he tells me the wrap started to slide down on him when he tried to put his other Velcro wrap over top of it to help keep things in order but nonetheless still had some issues. Electronic Signature(s) Signed: 06/07/2020 11:25:15 AM By: Lenda Kelp PA-C Entered By: Lenda Kelp on 06/07/2020  11:25:15 -------------------------------------------------------------------------------- Physical Exam Details Patient Name: Date of Service: [name], Detravion J. 06/07/2020 10:30 A M Medical Record Number: [id number] Patient Account Number: [id number] Date of Birth/Sex: Treating RN: April 07, 1951 (69 y.o. [name] Primary Care Provider: Marney Setting, [name] Other Clinician: Referring Provider: Treating Provider/Extender: Lenda Kelp MCGO [name], [name] in Treatment: 228 Constitutional Well-nourished and well-hydrated in no acute distress. Respiratory normal breathing without difficulty. Psychiatric this patient is able to make decisions and demonstrates good insight into disease process. Alert and Oriented x 3. pleasant and cooperative. Notes Upon inspection patient's wound bed actually showed signs of good granulation and a lot of the areas where the blistering was there does not appear to be any evidence of infection again this is in regard to the left leg. In regard to the right leg he seems to be doing fairly well at this time in fact there is no blistering everything looks excellent. Electronic Signature(s) Signed: 06/07/2020 11:25:38 AM By: Lenda Kelp PA-C Entered By: Lenda Kelp on 06/07/2020 11:25:37 -------------------------------------------------------------------------------- Physician Orders Details Patient Name: Date of Service: [name], Messiyah J. 06/07/2020 10:30 A M Medical Record Number: [id number] Patient Account Number: [id number] Date of Birth/Sex: Treating RN: 1951-05-06 (69 y.o. 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 Dressing Change Frequency Do not change entire dressing for one week. - both legs Skin Barriers/Peri-Wound Care Moisturizing lotion - both legs and feet and toes Wound Cleansing May shower with protection. Primary Wound Dressing Wound #177 Right T Great oe lginate with Silver - and any blistered areas on legs Calcium A Wound #178 Right T Second oe lginate with Silver - and any blistered areas on legs Calcium A Calcium Alginate with Silver Wound #180 Left,Proximal,Dorsal Foot lginate with Silver - and any blistered areas on legs Calcium A Wound #181 Left T Second oe lginate with Silver - and any blistered areas on legs Calcium A Secondary Dressing Wound #180 Left,Proximal,Dorsal Foot Dry Gauze Wound #177  Right T Great oe Kerlix/Rolled Gauze Dry Gauze Wound #178 Right T Second oe Kerlix/Rolled Gauze Dry Gauze Wound #181 Left T Second oe Kerlix/Rolled Gauze Dry Gauze Edema Control 4 layer compression: Left lower extremity Unna Boot to Right Lower Extremity Avoid standing for long periods of time Elevate legs to the level of the heart or above for 30 minutes daily and/or when sitting, a frequency of: - throughout the day Exercise regularly Electronic Signature(s) Signed: 06/07/2020 4:54:27 PM By: Zenaida Deed RN, BSN Signed: 06/07/2020 5:10:43 PM By: Lenda Kelp PA-C Entered By: Zenaida Deed on 06/07/2020 11:27:08 -------------------------------------------------------------------------------- Problem List Details Patient Name: Date of Service: [name], Capers  J. 06/07/2020 10:30 A M Medical Record Number: [id number] Patient Account Number: [id number] Date of Birth/Sex: Treating RN: January 30, 1951 (69 y.o. [name] Primary Care Provider: Marney Setting, [name] Other Clinician: Referring Provider: Treating Provider/Extender: Suzy Bouchard, [name] in Treatment: 228 Active Problems ICD-10 Encounter Code Description Active Date MDM Diagnosis 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 skin 06/30/2018 No Yes L97.221 Non-pressure chronic ulcer of left calf limited to breakdown of skin 09/30/2016 No Yes 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 of 01/22/2016 No Yes bilateral lower extremity I89.0 Lymphedema, not elsewhere classified 01/22/2016 No Yes E11.622 Type 2 diabetes mellitus with other skin ulcer 01/22/2016 No Yes 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, unspecified 01/22/2016 No Yes L03.116 Cellulitis of left lower limb 04/01/2017 No Yes Inactive Problems ICD-10 Code Description Active Date Inactive Date 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 skin 06/30/2017 06/30/2017 L97.521 Non-pressure chronic ulcer of other 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 of skin 04/27/2018 04/27/2018 L03.115 Cellulitis of right lower limb 12/22/2017 12/22/2017 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 06/30/2018 06/30/2018 L97.511 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 of skin 06/30/2018 06/30/2018 Resolved Problems Electronic Signature(s) Signed: 06/07/2020 10:43:16 AM By: Lenda Kelp PA-C Entered By: Lenda Kelp on 06/07/2020 10:43:14 -------------------------------------------------------------------------------- Progress Note Details Patient Name: Date of Service: [name], Osinachi J. 06/07/2020 10:30 A M Medical Record Number: [id number] Patient Account Number:  [id number] Date of Birth/Sex: Treating RN: [birth year] (69 y.o. [name] Primary Care Provider: Marney Setting, [name] Other Clinician: Referring Provider: Treating Provider/Extender: Lenda Kelp MCGO [name], [name] in Treatment: 228 Subjective Chief Complaint Information obtained from Patient patient is here for evaluation venous/lymphedema weeping History of Present Illness (HPI) Referred by PCP for consultation. Patient has long standing history of BLE venous stasis, no prior ulcerations. At beginning of month, developed cellulitis and weeping. Received IM Rocephin followed by Keflex and resolved. Wears compression stocking, appr 6 months old. Not sure strength. No present drainage. 01/22/16 this is a patient who is a type II diabetic on insulin. 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 He tells me he religiously wears pressure stockings of uncertain strength. He was here with weeping edema about 8 months ago but did not have an open wound. Roughly a month ago he had a reopening on his bilateral legs. He is been using bandages and Neosporin. He does not complain of pain. 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 Last BUN/creatinine I have is from 11/20/15 at 13 and 1.0 respectively 01/29/16; patient arrives today  having tolerated the Profore wrap. He brought in his stockings and these are 18 mmHg stockings he bought from [place]. The compression here is likely inadequate. He does not complain of pain or excessive drainage she has no systemic symptoms. The wound on the right looks improved as does the one on the left although one on the left is more substantial with still tissue at risk below the actual wound area on the bilateral posterior calf 02/05/16; patient arrives with poor edema control. He states that we did put a 4 layer compression on  it last week. No weight appear 5 this. 02/12/16; the area on the posterior right Has healed. The left Has a substantial wound that has necrotic surface eschar that requires a debridement with a curette. 02/16/16;the patient called or a Nurse visit secondary to increased swelling. He had been in earlier in the week with his right leg healed. He was transitioned to is on pressure stocking on the right leg with the only open wound on the left, a substantial area on the left posterior calf. Note he has a history of severe lower extremity edema, he has a history of chronic atrial fibrillation but not heart failure per my notes but I'll need to research this. He is not complaining of chest pain shortness of breath or orthopnea. The intake nurse noted blisters on the previously closed right leg 02/19/16; this is the patient's regular visit day. I see him on Friday with escalating edema new wounds on the right leg and clear signs of at least right ventricular heart failure. I increased his Lasix to 40 twice a day. He is returning currently in follow-up. States he is noticed a decrease in that the edema 02/26/16 patient's legs have much less edema. There is nothing really open on the right leg. The left leg has improved condition of the large superficial wound on the posterior left leg 03/04/16; edema control is very much better. The patient's right leg wounds have healed. On the left leg he continues to have severe venous inflammation on the posterior aspect of the left leg. There is no tenderness and I don't think any of this is cellulitis. 03/11/16; patient's right leg is married healed and he is in his own stocking. The patient's left leg has deteriorated somewhat. There is a lot of erythema around the wound on the posterior left leg. There is also a significant rim of erythema posteriorly just above where the wrap would've ended there is a new wound in this location and a lot of tenderness. Can't rule out  cellulitis in this area. 03/15/16; patient's right leg remains healed and he is in his own stocking. The patient's left leg is much better than last review. His major wound on the posterior aspect of his left Is almost fully epithelialized. He has 3 small injuries from the wraps. Really. Erythema seems a lot better on antibiotics 03/18/16; right leg remains healed and he is in his own stocking. The patient's left leg is much better. The area on the posterior aspect of the left calf is fully epithelialized. His 3 small injuries which were wrap injuries on the left are improved only one seems still open his erythema has resolved 03/25/16; patient's right leg remains healed and he is in his own stocking. There is no open area today on the left leg posterior leg is completely closed up. His wrap injuries at the superior aspect of his leg are also resolved. He looks as though he has some  irritation on the dorsal ankle but this is fully epithelialized without evidence of infection. 03/28/16; we discharged this patient on Monday. Transitioned him into his own stocking. There were problems almost immediately with uncontrolled swelling weeping edema multiple some of which have opened. He does not feel systemically unwell in particular no chest pain no shortness of breath and he does not feel 04/08/16; the edema is under better control with the Profore light wrap but he still has pitting edema. There is one large wound anteriorly 2 on the medial aspect of his left leg and 3 small areas on the superior posterior calf. Drainage is not excessive he is tolerating a Profore light well 04/15/16; put a Profore wrap on him last week. This is controlled is edema however he had a lot of pain on his left anterior foot most of his wounds are healed 04/22/16 once again the patient has denuded areas on the left anterior foot which he states are because his wrap slips up word. He saw his primary physician today is on Lasix 40 twice a  day and states that he his weight is down 20 pounds over the last 3 months. 04/29/16: Much improved. left anterior foot much improved. He is now on Lasix 80 mg per day. Much improved edema control 05/06/16; I was hoping to be able to discharge him today however once again he has blisters at a low level of where the compression was placed last week mostly on his left lateral but also his left medial leg and a small area on the anterior part of the left foot. 05/09/16; apparently the patient went home after his appointment on 7/4 later in the evening developing pain in his upper medial thigh together with subjective fever and chills although his temperature was not taken. The pain was so intense he felt he would probably have to call 911. However he then remembered that he had leftover doxycycline from a previous round of antibiotics and took these. By the next morning he felt a lot better. He called and spoke to one of our nurses and I approved doxycycline over the phone thinking that this was in relation to the wounds we had previously seen although they were definitely were not. The patient feels a lot better old fever no chills he is still working. Blood sugars are reasonably controlled 05/13/16; patient is back in for review of his cellulitis on his anterior medial upper thigh. He is taking doxycycline this is a lot better. Culture I did of the nodular area on the dorsal aspect of his foot grew MRSA this also looks a lot better. 05/20/16; the patient is cellulitis on the medial upper thigh has resolved. All of his wound areas including the left anterior foot, areas on the medial aspect of the left calf and the lateral aspect of the calf at all resolved. He has a new blister on the left dorsal foot at the level of the fourth toe this was excised. No evidence of infection 05/27/16; patient continues to complain weeping edema. He has new blisterlike wounds on the left anterior lateral and posterior lateral  calf at the top of his wrap levels. The area on his left anterior foot appears better. He is not complaining of fever, pain or pruritus in his feet. 05/30/16; the patient's blisters on his left anterior leg posterior calf all look improved. He did not increase the Lasix 100 mg as I suggested because he was going to run out of his 40 mg  tablets. He is still having weeping edema of his toes 06/03/16; I renewed his Lasix at 80 mg once a day as he was about to run out when I last saw him. He is on 80 mg of Lasix now. I have asked him to cut down on the excessive amount of water he was drinking and asked him to drink according to his thirst mechanisms 06/12/2016 -- was seen 2 days ago and was supposed to wear his compression stockings at home but he is developed lymphedema and superficial blisters on the left lower extremity and hence came in for a review 06/24/16; the remaining wound is on his left anterior leg. He still has edema coming from between his toes. There is lymphedema here however his edema is generally better than when I last saw this. He has a history of atrial fibrillation but does not have a known history of congestive heart failure nevertheless I think he probably has this at least on a diastolic basis. 07/01/16 I reviewed his echocardiogram from January 2017. This was essentially normal. He did not have LVH, EF of 55-60%. His right ventricular function was normal although he did have trivial tricuspid and pulmonic regurgitation. This is not audible on exam however. I increased his Lasix to do massive edema in his legs well above his knees I think in early July. He was also drinking an excessive amount of water at the time. 07/15/16; missed his appointment last week because of the Labor Day holiday on Monday. He could not get another appointment later in the week. Started to feel the wrap digging in superiorly so we remove the top half and the bottom half of his wrap. He has extensive erythema  and blistering superiorly in the left leg. Very tender. Very swollen. Edema in his foot with leaking edema fluid. He has not been systemically unwell 07/22/16; the area on the left leg laterally required some debridement. The medial wounds look more stable. His wrap injury wounds appear to have healed. Edema and his foot is better, weeping edema is also better. He tells me he is meeting with the supplier of the external compression pumps at work 08/05/16; the patient was on vacation last week in [facility]. His wrap is been on for an extended period of time. Also over the weekend he developed an extensive area of tender erythema across his anterior medial thigh. He took to doxycycline yesterday that he had leftover from a previous prescription. The patient complains of weeping edema coming out of his toes 08/08/16; I saw this patient on 10/2. He was tender across his anterior thigh. I put him on doxycycline. He returns today in follow-up. He does not have any open wounds on his lower leg, he still has edema weeping into his toes. 08/12/16; patient was seen back urgently today to follow-up for his extensive left thigh cellulitis/erysipelas. He comes back with a lot less swelling and erythema pain is much better. I believe I gave him Augmentin and Cipro. His wrap was cut down as he stated a roll down his legs. He developed blistering above the level of the wrap that remained. He has 2 open blisters and 1 intact. 08/19/16; patient is been doing his primary doctor who is increased his Lasix from 40-80 once a day or 80 already has less edema. Cellulitis has remained improved in the left thigh. 2 open areas on the posterior left calf 08/26/16; he returns today having new open blisters on the anterior part of his  left leg. He has his compression pumps but is not yet been shown how to use some vital representative from the supplier. 09/02/16 patient returns today with no open wounds on the left leg. Some  maceration in his plantar toes 09/10/2016 -- Dr. Leanord Hawking had recently discharged him on 09/02/2016 and he has come right back with redness swelling and some open ulcers on his left lower extremity. He says this was caused by trying to apply his compression stockings and he's been unable to use this and has not been able to use his lymphedema pumps. He had some doxycycline leftover and he has started on this a few days ago. 09/16/16; there are no open wounds on his leg on the left and no evidence of cellulitis. 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 He does not complain of symptoms here. I am not clear use using his external compression pumps. 09/23/16; I have not seen this patient in 2 weeks. He canceled his appointment 10 days ago as he was going on vacation. He tells me that on Monday he noticed a large area on his posterior left leg which is been draining copiously and is reopened into a large wound. He is been using ABDs and the external part of his juxtalite, according to our nurse this was not on properly. 10/07/16; Still a substantial area on the posterior left leg. Using silver alginate 10/14/16; in general better although there is still open area which looks healthy. Still using silver alginate. He reminds me that this happen before he left for [facility]. T oday while he was showering in the morning. He had been using his juxtalite's 10/21/16; the area on his posterior left leg is fully epithelialized. However he arrives today with a large area of tender erythema in his medial and posterior left thigh just above the knee. I have marked the area. Once again he is reluctant to consider hospitalization. I treated him with oral antibiotics in the past for a similar situation with resolution I think with doxycycline however this area it seems more extensive to me. He is not complaining of fever but does have chills and says states he is  thirsty. His blood sugar today was in the 140s at home 10/25/16 the area on his posterior left leg is fully epithelialized although there is still some weeping edema. The large area of tenderness and erythema in his medial and posterior left thigh is a lot less tender although there is still a lot of swelling in this thigh. He states he feels a lot better. He is on doxycycline and Augmentin that I started last week. This will continued until Tuesday, December 26. I have ordered a duplex ultrasound of the left thigh rule out DVT whether there is an abscess something that would need to be drained I would also like to know. 11/01/16; he still has weeping edema from a not fully epithelialized area on his left posterior calf. Most of the rest of this looks a lot better. He has completed his antibiotics. His thigh is a lot better. Duplex ultrasound did not show a DVT in the thigh 11/08/16; he comes in today with more Denuded surface epithelium from the posterior aspect of his calf. There is no real evidence of cellulitis. The superior aspect of his wrap appears to have put quite an indentation in his leg just below the knee and this may have contributed. He does not complain of pain  or fever. We have been using silver alginate as the primary dressing. The area of cellulitis in the right thigh has totally resolved. He has been using his compression stockings once a week 11/15/16; the patient arrives today with more loss of epithelium from the posterior aspect of his left calf. He now has a fairly substantial wound in this area. The reason behind this deterioration isn't exactly clear although his edema is not well controlled. He states he feels he is generally more swollen systemically. He is not complaining of chest pain shortness of breath fever. T me he has an appointment with his primary physician in early February. He is on 80 mg of oral ells Lasix a day. He claims compliance with the external compression  pumps. He is not having any pain in his legs similar to what he has with his recurrent cellulitis 11/22/16; the patient arrives a follow-up of his large area on his left lateral calf. This looks somewhat better today. He came in earlier in the week for a dressing change since I saw him a week ago. He is not complaining of any pain no shortness of breath no chest pain 11/28/16; the patient arrives for follow-up of his large area on the left lateral calf this does not look better. In fact it is larger weeping edema. The surface of the wound does not look too bad. We have been using silver alginate although I'm not certain that this is a dressing issue. 12/05/16; again the patient follows up for a large wound on the left lateral and left posterior calf this does not look better. There continues to be weeping edema necrotic surface tissue. More worrisome than this once again there is erythema below the wound involving the distal Achilles and heel suggestive of cellulitis. He is on his feet working most of the day of this is not going well. We are changing his dressing twice a week to facilitate the drainage. 12/12/16; not much change in the overall dimensions of the large area on the left posterior calf. This is very inflamed looking. I gave him an. Doxycycline last week does not really seem to have helped. He found the wrap very painful indeed it seems to of dog into his legs superiorly and perhaps around the heel. He came in early today because the drainage had soaked through his dressings. 12/19/16- patient arrives for follow-up evaluation of his left lower extremity ulcers. He states that he is using his lymphedema pumps once daily when there is "no drainage". He admits to not using his lipedema pumps while under current treatment. His blood sugars have been consistently between 150-200. 12/26/16; the patient is not using his compression pumps at home because of the wetness on his feet. I've advised him that I  think it's important for him to use this daily. He finds his feet too wet, he can put a plastic bag over his legs while he is in the pumps. Otherwise I think will be in a vicious circle. We are using silver alginate to the major area on his left posterior calf 01/02/17; the patient's posterior left leg has further of all into 3 open wounds. All of them covered with a necrotic surface. He claims to be using his compression pumps once a day. His edema control is marginal. Continue with silver alginate 01/10/17; the patient's left posterior leg actually looks somewhat better. There is less edema, less erythema. Still has 3 open areas covered with a necrotic surface requiring debridement. He  claims to be using his compression pumps once a day his edema control is better 01/17/17; the patient's left posterior calf look better last week when I saw him and his wrap was changed 2 days ago. He has noted increasing pain in the left heel and arrives today with much larger wounds extensive erythema extending down into the entire heel area especially tender medially. He is not systemically unwell CBGs have been controlled no fever. Our intake nurse showed me limegreen drainage on his AVD pads. 01/24/17; his usual this patient responds nicely to antibiotics last week giving him Levaquin for presumed Pseudomonas. The whole entire posterior part of his leg is much better much less inflamed and in the case of his Achilles heel area much less tender. He has also had some epithelialization posteriorly there are still open areas here and still draining but overall considerably better 01/31/17- He has continue to tolerate the compression wraps. he states that he continues to use the lymphedema pumps daily, and can increase to twice daily on the weekends. He is voicing no complaints or concerns regarding his LLE ulcers 02/07/17-he is here for follow-up evaluation. He states that he noted some erythema to the left medial and  anterior thigh, which he states is new as of yesterday. He is concerned about recurrent cellulitis. He states his blood sugars have been slightly elevated, this morning in the 180s 02/14/17; he is here for follow-up evaluation. When he was last here there was erythema superiorly from his posterior wound in his anterior thigh. He was prescribed Levaquin however a culture of the wound surface grew MRSA over the phone I changed him to doxycycline on Monday and things seem to be a lot better. 02/24/17; patient missed his appointment on Friday therefore we changed his nurse visit into a physician visit today. Still using silver alginate on the large area of the posterior left thigh. He isn't new area on the dorsal left second toe 03/03/17; actually better today although he admits he has not used his external compression pumps in the last 2 days or so because of work responsibilities over the weekend. 03/10/17; continued improvement. External compression pumps once a day almost all of his wounds have closed on the posterior left calf. Better edema control 03/17/17; in general improved. He still has 3 small open areas on the lateral aspect of his left leg however most of the area on the posterior part of his leg is epithelialized. He has better edema control. He has an ABD pad under his stocking on the right anterior lower leg although he did not let us look at that today. 03/24/17; patient arrives back in clinic today with no open areas however there are areas on the posterior left calf and anterior left calf that are less than 100% epithelialized. His edema is well controlled in the left lower leg. There is some pitting edema probably lymphedema in the left upper thigh. He uses compression pumps at home once per day. I tried to get him to do this twice a day although he is very reticent. 04/01/2017 -- for the last 2 days he's had significant redness, tenderness and weeping and came in for an urgent visit  today. 04/07/17; patient still has 6 more days of doxycycline. He was seen by Dr. Meyer Russel last Wednesday for cellulitis involving the posterior aspect, lateral aspect of his Involving his heel. For the most part he is better there is less erythema and less weeping. He has been on his feet  for 12 hours o2 over the weekend. Using his compression pumps once a day 04/14/17 arrives today with continued improvement. Only one area on the posterior left calf that is not fully epithelialized. He has intense bilateral venous inflammation associated with his chronic venous insufficiency disease and secondary lymphedema. We have been using silver alginate to the left posterior calf wound In passing he tells [place] today that the right leg but we have not seen in quite some time has an open area on it but he doesn't want [place] to look at this today states he will show this to [place] next week. 04/21/17; there is no open area on his left leg although he still reports some weeping edema. He showed [place] his right leg today which is the first time we've seen this leg in a long time. He has a large area of open wound on the right leg anteriorly healthy granulation. Quite a bit of swelling in the right leg and some degree of venous inflammation. He told us about the right leg in passing last week but states that deterioration in the right leg really only happened over the weekend 04/28/17; there is no open area on the left leg although there is an irritated part on the posterior which is like a wrap injury. The wound on the right leg which was new from last week at least to [place] is a lot better. 05/05/17; still no open area on the left leg. Patient is using his new compression stocking which seems to be doing a good job of controlling the edema. He states he is using his compression pumps once per day. The right leg still has an open wound although it is better in terms of surface area. Required debridement. A lot of pain in the posterior  right Achilles marked tenderness. Usually this type of presentation this patient gives concern for an active cellulitis 05/12/17; patient arrives today with his major wound from last week on the right lateral leg somewhat better. Still requiring debridement. He was using his compression stocking on the left leg however that is reopened with superficial wounds anteriorly he did not have an open wound on this leg previously. He is still using his juxta light's once daily at night. He cannot find the time to do this in the morning as he has to be at work by 7 AM 05/19/17; right lateral leg wound looks improved. No debridement required. The concerning area is on the left posterior leg which appears to almost have a subcutaneous hemorrhagic component to it. We've been using silver alginate to all the wounds 05/26/17; the right lateral leg wound continues to look improved. However the area on the left posterior calf is a tightly adherent surface. [place] using silver alginate. Because of the weeping edema in his legs there is very little good alternatives. 06/02/17; the patient left here last week looking quite good. Major wound on the left posterior calf and a small one on the right lateral calf. Both of these look satisfactory. He tells me that by Wednesday he had noted increased pain in the left leg and drainage. He called on Thursday and Friday to get an appointment here but we were blocked. He did not go to urgent care or his primary physician. He thinks he had a fever on Thursday but did not actually take his temperature. He has not been using his compression pumps on the left leg because of pain. I advised him to go to the emergency room  today for IV antibiotics for stents of left leg cellulitis but he has refused I have asked him to take 2 days off work to keep his leg elevated and he has refused this as well. In view of this I'm going to call him and Augmentin and doxycycline. He tells me he took  some leftover doxycycline starting on Friday previous cultures of the left leg have grown MRSA 06/09/2017 -- the patient has florid cellulitis of his left lower extremity with copious amount of drainage and there is no doubt in my mind that he needs inpatient care. However after a detailed discussion regarding the risk benefits and alternatives he refuses to get admitted to the hospital. With no other recourse I will continue him on oral antibiotics as before and hopefully he'll have his infectious disease consultation this week. 06/16/2017 -- the patient was seen today by the nurse practitioner at infectious disease Ms. Dixon. 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 He has also been advise Lotrimin cream locally. she also had wise IV antibiotics for his condition if it flares up 06/23/17; patient arrives today with drainage bilaterally although the remaining wound on the left posterior calf after cleaning up today "highlighter yellow drainage" did not look too bad. Unfortunately he has had breakdown on the right anterior leg [previously this leg had not been open and he is using a black stocking] he went to see infectious disease and is been put on clindamycin 150 mg daily, I did not verify the dose although I'm not familiar with using clindamycin in this dosing range, perhaps for prophylaxisoo 06/27/17; I brought this patient back today to follow-up on the wound deterioration on the right lower leg together with surrounding cellulitis. I started him on doxycycline 4 days ago. This area looks better however he comes in today with intense cellulitis on the medial part of his left thigh. This is not have a wound in this area. Extremely tender. We've been using silver alginate to the wounds on the right lower leg left lower  leg with bilateral 4 layer compression he is using his external compression pumps once a day 07/04/17; patient's left medial thigh cellulitis looks better. He has not been using his compression pumps as his insert said it was contraindicated with cellulitis. His right leg continues to make improvements all the wounds are still open. We only have one remaining wound on the left posterior calf. Using silver alginate to all open areas. He is on doxycycline which I started a week ago and should be finishing I gave him Augmentin after Thursday's visit for the severe cellulitis on the left medial thigh which fortunately looks better 07/14/17; the patient's left medial thigh cellulitis has resolved. The cellulitis in his right lower calf on the right also looks better. All of his wounds are stable to improved we've been using silver alginate he has completed the antibiotics I have given him. He has clindamycin 150 mg once a day prescribed by infectious disease for prophylaxis, I've advised him to start this now. We have been using bilateral Unna boots over silver alginate to the wound areas 07/21/17; the patient is been to see infectious disease who noted his recurrent problems with cellulitis. He was not able to tolerate prophylactic clindamycin therefore he is on amoxicillin 500 twice a  day. He also had a second daily dose of Lasix added By Dr. Oneta Rack but he is not taking this. Nor is he being completely compliant with his compression pumps a especially not this week. He has 2 remaining wounds one on the right posterior lateral lower leg and one on the left posterior medial lower leg. 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 The patient has Unna boots bilaterally. Still wounds on his right lateral, left medial, and a new open area on the left anterior lateral lower leg 08/04/17; he remains on amoxicillin twice a day for prophylaxis  of recurrent cellulitis. He has bilateral Unna boots for compression and silver alginate to his wounds. Arrives today with his legs looking as good as I have seen him in quite some time. Not surprisingly his wounds look better as well with improvement on the right lateral leg venous insufficiency wound and also the left medial leg. He is still using the compression pumps once a day 08/11/17; both legs appear to be doing better wounds on the right lateral and left medial legs look better. Skin on the right leg quite good. He is been using silver alginate as the primary dressing. I'm going to use Anasept gel calcium alginate and maintain all the secondary dressings 08/18/17; the patient continues to actually do quite well. The area on his right lateral leg is just about closed the left medial also looks better although it is still moist in this area. His edema is well controlled we have been using Anasept gel with calcium alginate and the usual secondary dressings, 4 layer compression and once daily use of his compression pumps "always been able to manage 09/01/17; the patient continues to do reasonably well in spite of his trip to [place]. The area on the right lateral leg is epithelialized. Left is much better but still open. He has more edema and more chronic erythema on the left leg [venous inflammation] 09/08/17; he arrives today with no open wound on the right lateral leg and decently controlled edema. Unfortunately his left leg is not nearly as in his good situation as last week.he apparently had increasing edema starting on Saturday. He edema soaked through into his foot so used a plastic bag to walk around his home. The area on the medial right leg which was his open area is about the same however he has lost surface epithelium on the left lateral which is new and he has significant pain in the Achilles area of the left foot. He is already on amoxicillin chronically for prophylaxis of cellulitis  in the left leg 09/15/17; he is completed a week of doxycycline and the cellulitis in the left posterior leg and Achilles area is as usual improved. He still has a lot of edema and fluid soaking through his dressings. There is no open wound on the right leg. He saw infectious disease NP today 09/22/17;As usual 1 we transition him from our compression wraps to his stockings things did not go well. He has several small open areas on the right leg. He states this was caused by the compression wrap on his skin although he did not wear this with the stockings over them. He has several superficial areas on the left leg medially laterally posteriorly. He does not have any evidence of active cellulitis especially involving the left Achilles The patient is traveling from [facility] Saturday going to [facility]. He states he isn't attempting  to get an appointment with a heel objects wound center there to change his dressings. I am not completely certain whether this will work 10/06/17; the patient came in on Friday for a nurse visit and the nurse reported that his legs actually look quite good. He arrives in clinic today for his regular follow-up visit. He has a new wound on his left third toe over the PIP probably caused by friction with his footwear. He has small areas on the left leg and a very superficial but epithelialized area on the right anterior lateral lower leg. Other than that his legs look as good as I've seen him in quite some time. We have been using silver alginate Review of systems; no chest pain no shortness of breath other than this a 10 point review of systems negative 10/20/17; seen by Dr. Meyer RusselBritto last week. He had taken some antibiotics [doxycycline] that he had left over. Dr. Meyer RusselBritto thought he had candida infection and declined to give him further antibiotics. He has a small wound remaining on the right lateral leg several areas on the left leg including a larger area on the  left posterior several left medial and anterior and a small wound on the left lateral. The area on the left dorsal third toe looks a lot better. ROS; Gen.; no fever, respiratory no cough no sputum Cardiac no chest pain other than this 10 point review of system is negative 10/30/17; patient arrives today having fallen in the bathtub 3 days ago. It took him a while to get up. He has pain and maceration in the wounds on his left leg which have deteriorated. He has not been using his pumps he also has some maceration on the right lateral leg. 11/03/17; patient continues to have weeping edema especially in the left leg. This saturates his dressings which were just put on on 12/27. As usual the doxycycline seems to take care of the cellulitis on his lower leg. He is not complaining of fever, chills, or other systemic symptoms. He states his leg feels a lot better on the doxycycline I gave him empirically. He also apparently gets injections at his primary doctor's officeo Rocephin for cellulitis prophylaxis. I didn't ask him about his compression pump compliance today I think that's probably marginal. Arrives in the clinic with all of his dressings primary and secondary macerated full of fluid and he has bilateral edema 11/10/17; the patient's right leg looks some better although there is still a cluster of wounds on the right lateral. The left leg is inflamed with almost circumferential skin loss medially to laterally although we are still maintaining anteriorly. He does not have overt cellulitis there is a lot of drainage. He is not using compression pumps. We have been using silver alginate to the wound areas, there are not a lot of options here 11/17/17; the patient's right leg continues to be stable although there is still open wounds, better than last week. The inflammation in the left leg is better. Still loss of surface layer epithelium especially posteriorly. There is no overt cellulitis in the amount  of edema and his left leg is really quite good, tells me he is using his compression pumps once a day. 11/24/17; patient's right leg has a small superficial wound laterally this continues to improve. The inflammation in the left leg is still improving however we have continuous surface layer epithelial loss posteriorly. There is no overt cellulitis in the amount of edema in both legs is really quite  good. He states he is using his compression pumps on the left leg once a day for 5 out of 7 days 12/01/17; very small superficial areas on the right lateral leg continue to improve. Edema control in both legs is better today. He has continued loss of surface epithelialization and left posterior calf although I think this is better. We have been using silver alginate with large number of absorptive secondary dressings 4 layer on the left Unna boot on the right at his request. He tells me he is using his compression pumps once a day 12/08/17; he has no open area on the right leg is edema control is good here. ooOn the left leg however he has marked erythema and tenderness breakdown of skin. He has what appears to be a wrap injury just distal to the popliteal fossa. 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 There was swelling erythema and breakdown in the right leg. He has much worse skin breakdown in the left leg as well multiple open areas medially and posteriorly as well as laterally. He tells me he has been using his compression pumps but tells me he feels that the drainage out of his leg is worse when he uses a compression pumps. T be fair to him he is been saying this o for a while however I don't know that I have really been  listening to this. I wonder if the compression pumps are working properly 12/22/17;. Once again he arrives with severe erythema, weeping edema from the left greater than right leg. Noncompliance with compression pumps. New this visit he is complaining of pain on the lateral aspect of the right leg and the medial aspect of his right thigh. He apparently saw his cardiologist Dr. [name] who was ordered an echocardiogram area and I think this is a step in the right direction 12/25/17; started his doxycycline Monday night. There is still intense erythema of the right leg especially in the anterior thigh although there is less tenderness. The erythema around the wound on the right lateral calf also is less tender. He still complaining of pain in the left heel. His wounds are about the same right lateral left medial left lateral. Superficial but certainly not close to closure. He denies being systemically unwell no fever chills no abdominal pain no diarrhea 12/29/17; back in follow-up of his extensive right calf and right thigh cellulitis. I added amoxicillin to cover possible doxycycline resistant strep. This seems to of done the trick he is in much less pain there is much less erythema and swelling. He has his echocardiogram at 11:00 this morning. X-ray of the left heel was also negative. 01/05/18; the patient arrived with his edema under much better control. Now that he is retired he is able to use his compression pumps daily and sometimes twice a day per the patient. He has a wound on the right leg the lateral wound looks better. Area on the left leg also looks a lot better. He has no evidence of cellulitis in his bilateral thighs I had a quick peak at his echocardiogram. He is in normal ejection fraction and normal left ventricular function. He has moderate pulmonary hypertension moderately reduced right ventricular function. One would have  to wonder about chronic sleep apnea although he says he doesn't snore.  He'll review the echocardiogram with his cardiologist. 01/12/18; the patient arrives with the edema in both legs under exemplary control. He is using his compression pumps daily and sometimes twice daily. His wound on the right lateral leg is just about closed. He still has some weeping areas on the posterior left calf and lateral left calf although everything is just about closed here as well. I have spoken with Aldean Baker who is the patient's nurse practitioner and infectious disease. She was concerned that the patient had not understood that the parenteral penicillin injections he was receiving for cellulitis prophylaxis was actually benefiting him. I don't think the patient actually saw that I would tend to agree we were certainly dealing with less infections although he had a serious one last month. 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 He is healed. He'll be placed in juxtalite compression wraps and increase his lymphedema pumps to twice daily. We will follow up again next week to ensure there are no issues with the new regiment. 01/20/18-he is here for evaluation of bilateral lower extremity weeping edema. Yesterday he was placed in compression wrap to the right lower extremity and compression stocking to left lower shrubbery. He states he uses lymphedema pumps last night and again this morning and noted a blister to the left lower extremity. On exam he was noted to have drainage to the right lower extremity. He will be placed in Unna boots bilaterally and follow-up next week 01/26/18; patient was actually discharged a week ago to his own juxta light stockings only to return the next day with bilateral lower extremity weeping edema.he was placed in bilateral Unna boots. He arrives today with pain in the back of his left leg. There is no open area on the right leg however there is a linear/wrap injury on the left leg and weeping edema on the left leg  posteriorly. I spoke with infectious disease about 10 days ago. They were disappointed that the patient elected to discontinue prophylactic intramuscular penicillin shots as they felt it was particularly beneficial in reducing the frequency of his cellulitis. I discussed this with the patient today. He does not share this view. He'll definitely need antibiotics today. Finally he is traveling to [place] and trauma leaving this Saturday and returning a week later and he does not travel with his pumps. He is going by car 01/30/18; patient was seen 4 days ago and brought back in today for review of cellulitis in the left leg posteriorly. I put him on amoxicillin this really hasn't helped as much as I might like. He is also worried because he is traveling to [facility] trauma by car. Finally we will be rewrapping him. There is no open area on the right leg over his left leg has multiple weeping areas as usual 02/09/18; The same wrap on for 10 days. He did not pick up the last doxycycline I prescribed for him. He apparently took 4 days worth he already had. There is nothing open on his right leg and the edema control is really quite good. He's had damage in the left leg medially and laterally especially probably related to the prolonged use of Unna boots 02/12/18; the patient arrived in clinic today for a nurse visit/wrap change. He complained of a lot of pain in the left posterior calf. He is taking doxycycline that I previously prescribed for him. Unfortunately even though  he used his stockings and apparently used to compression pumps twice a day he has weeping edema coming out of the lateral part of his right leg. This is coming from the lower anterior lateral skin area. 02/16/18; the patient has finished his doxycycline and will finish the amoxicillin 2 days. The area of cellulitis in the left calf posteriorly has resolved. He is no longer having any pain. He tells me he is using his compression pumps at  least once a day sometimes twice. 02/23/18; the patient finished his doxycycline and Amoxil last week. On Friday he noticed a small erythematous circle about the size of a quarter on the left lower leg just above his ankle. This rapidly expanded and he now has erythema on the lateral and posterior part of the thigh. This is bright red. Also has an area on the dorsal foot just above his toes and a tender area just below the left popliteal fossa. He came off his prophylactic penicillin injections at his own insistence one or 2 months ago. This is obviously deteriorated since then 03/02/18; patient is on doxycycline and Amoxil. Culture I did last week of the weeping area on the back of his left calf grew group B strep. I have therefore renewed the amoxicillin 500 3 times a day for a further week. He has not been systemically unwell. Still complaining of an area of discomfort right under his left popliteal fossa. There is no open wound on the right leg. He tells me that he is using his pumps twice a day on most days 03/09/18; patient arrives in clinic today completing his amoxicillin today. The cellulitis on his left leg is better. Furthermore he tells me that he had intramuscular penicillin shots that his primary care office today. However he also states that the wrap on his right leg fell down shortly after leaving clinic last week. He developed a large blister that was present when he came in for a nurse visit later in the week and then he developed intense discomfort around this area.He tells me he is using his compression pumps 03/16/18; the patient has completed his doxycycline. The infectious part of this/cellulitis in the left heel area left popliteal area is a lot better. He has 2 open areas on the right calf. Still areas on the left calf but this is a lot better as well. 03/24/18; the patient arrives complaining of pain in the left popliteal area again. He thinks some of this is wrap injury. He has no  open area on the right leg and really no open area on the left calf either except for the popliteal area. He claims to be compliant with the compression pumps 03/31/18; I gave him doxycycline last week because of cellulitis in the left popliteal area. This is a lot better although the surface epithelium is denuded off and response to this. He arrives today with uncontrolled edema in the right calf area as well as a fingernail injury in the right lateral calf. There is only a few open areas on the left 04/06/18; I gave him amoxicillin doxycycline over the last 2 weeks that the amoxicillin should be completing currently. He is not complaining of any pain or systemic symptoms. The only open areas see has is on the right lateral lower leg paradoxically I cannot see anything on the left lower leg. He tells me he is using his compression pumps twice a day on most days. Silver alginate to the wounds that are open under 4  layer compression 04/13/18; he completed antibiotics and has no new complaints. Using his compression pumps. Silver alginate that anything that's opened 04/20/18; he is using his compression pumps religiously. Silver alginate 4 layer compression anything that's opened. He comes in today with no open wounds on the left leg but 3 on the right including a new one posteriorly. He has 2 on the right lateral and one on the right posterior. He likes Unna boots on the right leg for reasons that aren't really clear we had the usual 4 layer compression on the left. It may be necessary to move to the 4 layer compression on the right however for now I left them in the Unna boots 04/27/18; he is using his compression pumps at least once a day. He has still the wounds on the right lateral calf. The area right posteriorly has closed. He does not have an open wound on the left under 4 layer compression however on the dorsal left foot just proximal to the toes and the left third toe 2 small open areas were  identified 05/11/18; he has not uses compression pumps. The areas on the right lateral calf have coalesced into one large wound necrotic surface. On the left side he has one small wound anteriorly however the edema is now weeping out of a large part of his left leg. He says he wasn't using his pumps because of the weeping fluid. I explained to him that this is the time he needs to pump more 05/18/18; patient states he is using his compression pumps twice a day. The area on the right lateral large wound albeit superficial. On the left side he has innumerable number of small new wounds on the left calf particularly laterally but several anteriorly and medially. All these appear to have healthy granulated base these look like the remnants of blisters however they occurred under compression. The patient arrives in clinic today with his legs somewhat better. There is certainly less edema, less multiple open areas on the left calf and the right anterior leg looks somewhat better as well superficial and a little smaller. However he relates pain and erythema over the last 3-4 days in the thigh and I looked at this today. He has not been systemically unwell no fever no chills no change in blood sugar values 05/25/18; comes in today in a better state. The severe cellulitis on his left leg seems better with the Keflex. Not as tender. He has not been systemically unwell ooHard to find an open wound on the left lower leg using his compression pumps twice a day ooThe confluent wounds on his right lateral calf somewhat better looking. These will ultimately need debridement I didn't do this today. 06/01/18; the severe cellulitis on the left anterior thigh has resolved and he is completed his Keflex. ooThere is no open wound on the left leg however there is a superficial excoriation at the base of the third toe dorsally. Skin on the bottom of his left foot is macerated looking. ooThe left the wounds on the lateral  right leg actually looks some better although he did require debridement of the top half of this wound area with an open curet 06/09/18 on evaluation today patient appears to be doing poorly in regard to his right lower extremity in particular this appears to likely be infected he has very thick purulent discharge along with a bright green tent to the discharge. This makes me concerned about the possibility of pseudomonas. He's also having  increased discomfort at this point on evaluation. Fortunately there does not appear to be any evidence of infection spreading to the other location at this time. 06/16/18 on evaluation today patient appears to actually be doing fairly well. His ulcer has actually diminished in size quite significantly at this point which is good news. Nonetheless he still does have some evidence of infection he did see infectious disease this morning before coming here for his appointment. I did review the results of their evaluation and their note today. They did actually have him discontinue the Cipro and initiate treatment with linezolid at this time. He is doing this for the next seven days and they recommended a follow-up in four months with them. He is the keep a log of the need for intermittent antibiotic therapy between now and when he falls back up with infectious disease. This will help them gaze what exactly they need to do to try and help them out. 06/23/18; the patient arrives today with no open wounds on the left leg and left third toe healed. He is been using his compression pumps twice a day. On the right lateral leg he still has a sizable wound but this is a lot better than last time I saw this. 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 Has been using silver alginate under 4 layer compression 06/30/18; the only open wound he has is on the right lateral leg and this  looks healthy. No debridement is required. We have been using silver alginate. He does not have an open wound on the left leg. There is apparently some drainage from the dorsal proximal third toe on the left although I see no open wound here. 07/03/18 on evaluation today patient was actually here just for a nurse visit rapid change. However when he was here on Wednesday for his rat change due to having been healed on the left and then developing blisters we initiated the wrap again knowing that he would be back today for [place] to reevaluate and see were at. Unfortunately he has developed some cellulitis into the proximal portion of his right lower extremity even into the region of his thigh. 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 He was placed on one as what at that point. Nonetheless he is done with that and has been tolerating it well otherwise. Doxycycline which in the past really did not seem to be effective for him. Nonetheless I think the best option may be for [place] to definitely reinitiate the antibiotics for a longer period of time. 07/07/18; since I last saw this patient a week ago he has had a difficult time. At that point he did not have an open wound on his left leg. We transitioned him into juxta light stockings. He was apparently in the clinic the next day with blisters on the left lateral and left medial lower calf. He also had weeping edema fluid. He was put back into a compression wrap. He was also in the clinic on Friday with intense erythema in his right thigh. Per the patient he was started on Bactrim however that didn't work at all in terms of relieving his pain and swelling. He has taken 3 doxycycline that he had left over from last time and that seems to of helped. He has blistering on the right thigh as well. 07/14/18;  the erythema on his right thigh has gotten better with doxycycline that he is finishing. The culture that I did of a blister  on the right lateral calf just below his knee grew MRSA resistant to doxycycline. Presumably this cellulitis in the thigh was not related to that although I think this is a bit concerning going forward. He still has an area on the right lateral calf the blister on the right medial calf just below the knee that was discussed above. On the left 2 small open areas left medial and left lateral. Edema control is adequate. He is using his compression pumps twice a day 07/20/18; continued improvement in the condition of both legs especially the edema in his bilateral thighs. He tells me he is been losing weight through a combination of diet and exercise. He is using his compression pumps twice a day. So overall she made to the remaining wounds 07/27/2018; continued improvement in condition of both legs. His edema is well controlled. The area on the right lateral leg is just about closed he had one blisters show up on the medial left upper calf. We have him in 4 layer compression. He is going on a 10-day trip to [place], [place] and [place]. He will be driving. He wants to wear Unna boots because of the lessening amount of constriction. He will not use compression pumps while he is away 08/05/18 on evaluation today patient actually appears to be doing decently well all things considered in regard to his bilateral lower extremities. The worst ulcer is actually only posterior aspect of his left lower extremity with a four layer compression wrap cut into his leg a couple weeks back. He did have a trip and actually had [employer] for the trip that he is worn since he was last here. Nonetheless he feels like the [employer] actually do better for him his swelling is up a little bit but he also with his trip was not taking his Lasix on a regular set schedule like he was supposed to be. He states that obviously the reason being that he cannot drive and keep going without having to urinate too frequently  which makes it difficult. He did not have his pumps with him while he was away either which I think also maybe playing a role here too. 08/13/2018; the patient only has a small open wound on the right lateral calf which is a big improvement in the last month or 2. He also has the area posteriorly just below the posterior fossa on the left which I think was a wrap injury from several weeks ago. He has no current evidence of cellulitis. He tells me he is back into his compression pumps twice a day. He also tells me that while he was at the laundromat somebody stole a section of his extremitease stockings 08/20/2018; back in the clinic with a much improved state. He only has small areas on the right lateral mid calf which is just about healed. This was is more substantial area for quite a prolonged period of time. He has a small open area on the left anterior tibia. The area on the posterior calf just below the popliteal fossa is closed today. He is using his compression pumps twice a day 08/28/2018; patient has no open wound on the right leg. He has a smattering of open areas on the calf with some weeping lymphedema. More problematically than that it looks as though his wraps of  slipped down in his usual he has very angry upper area of edema just below the right medial knee and on the right lateral calf. He has no open area on his feet. The patient is traveling to [facility] next week. I will send him in an antibiotic. We will continue to wrap the right leg. We ordered extremitease stockings for him last week and I plan to transition the right leg to a stocking when he gets home which will be in 10 days time. As usual he is very reluctant to take his pumps with him when he travels 09/07/2018; patient returns from [facility]. He shows me a picture of his left leg in the mid part of his trip last week with intense fire engine erythema. The picture look bad enough I would have considered sending him to  the hospital. Instead he went to the wound care center in [facility]. They did not prescribe him antibiotics but he did take some doxycycline he had leftover from a previous visit. I had given him trimethoprim sulfamethoxazole before he left this did not work according to the patient. This is resulted in some improvement fortunately. He comes back with a large wound on the left posterior calf. Smaller area on the left anterior tibia. Denuded blisters on the dorsal left foot over his toes. Does not have much in the way of wounds on the right leg although he does have a very tender area on the right posterior area just below the popliteal fossa also suggestive of infection. He promises me he is back on his pumps twice a day 09/15/2018; the intense cellulitis in his left lower calf is a lot better. The wound area on the posterior left calf is also so better. However he has reasonably extensive wounds on the dorsal aspect of his second and third toes and the proximal foot just at the base of the toes. There is nothing open on the right leg 09/22/2018; the patient has excellent edema control in his legs bilaterally. He is using his external compression pumps twice a day. He has no open area on the right leg and only the areas in the left foot dorsally second and third toe area on the left side. He does not have any signs of active cellulitis. 10/06/2018; the patient has good edema control bilaterally. He has no open wound on the right leg. There is a blister in the posterior aspect of his left calf that we had to deal with today. He is using his compression pumps twice a day. There is no signs of active cellulitis. We have been using silver alginate to the wound areas. He still has vulnerable areas on the base of his left first second toes dorsally He has a his extremities stockings and we are going to transition him today into the stocking on the right leg. He is cautioned that he will need to  continue to use the compression pumps twice a day. If he notices uncontrolled edema in the right leg he may need to go to 3 times a day. 10/13/2018; the patient came in for a nurse check on Friday he has a large flaccid blister on the right medial calf just below the knee. We unroofed this. He has this and a new area underneath the posterior mid calf which was undoubtedly a blister as well. He also has several small areas on the right which is the area we put his extremities stocking on. 10/19/2018; the patient went  to see infectious disease this morning I am not sure if that was a routine follow-up in any case the doxycycline I had given him was discontinued and started on linezolid. He has not started this. It is easy to look at his left calf and the inflammation and think this is cellulitis however he is very tender in the tissue just below the popliteal fossa and I have no doubt that there is infection going on here. He states the problem he is having is that with the compression pumps the edema goes down and then starts walking the wrap falls down. We will see if we can adhere this. He has 1 or 2 minuscule open areas on the right still areas that are weeping on the posterior left calf, the base of his left second and third toes 10/26/18; back today in clinic with quite of skin breakdown in his left anterior leg. This may have been infection the area below the popliteal fossa seems a lot better however tremendous epithelial loss on the left anterior mid tibia area over quite inexpensive tissue. He has 2 blisters on the right side but no other open wound here. 10/29/2018; came in urgently to see [place] today and we worked him in for review. He states that the 4 layer compression on the right leg caused pain he had to cut it down to roughly his mid calf this caused swelling above the wrap and he has blisters and skin breakdown today. As a result of the pain he has not been using his pumps. Both legs are  a lot more edematous and there is a lot of weeping fluid. 11/02/18; arrives in clinic with continued difficulties in the right leg> left. Leg is swollen and painful. multiple skin blisters and new open areas especially laterally. He has not been using his pumps on the right leg. 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 He is not systemically unwell. 11/09/2018; the patient claims he is being compliant with his pumps. He is finished the doxycycline I gave him last week. Culture I did of the wound on the right lateral leg showed a few very resistant methicillin staph aureus. This was resistant to doxycycline. Nevertheless he states the pain in the leg is a lot better which makes me wonder if the cultured organism was not really what was causing the problem nevertheless this is a very dangerous organism to be culturing out of any wound. His right leg is still a lot larger than the left. He is using an Radio broadcast assistant on this area, he blames a 4-layer compression for causing the original skin breakdown which I doubt is true however I cannot talk him out of it. We have been using silver alginate to all of these areas which were initially blisters 11/16/2018; patient is being compliant with his external compression pumps at twice a day. Miraculously he arrives in clinic today with absolutely no open wounds. He has better edema control on the left where he has been using 4 layer compression versus wound of wounds on the right and I pointed this out to him. There is no inflammation in the skin in his lower legs which is also somewhat unusual for him. There is no open wounds on the dorsal left foot. He has extremitease stockings at home and I have asked him to bring these in next week. 11/25/18 patient's lower extremity on examination today on the left appears for the most part to  be wound free. He does have an open wound on the lateral aspect of the right lower extremity  but this is minimal compared to what I've seen in past. He does request that we go ahead and wrap the left leg as well even though there's nothing open just so hopefully it will not reopen in short order. 1/28; patient has superficial open wounds on the right lateral calf left anterior calf and left posterior calf. His edema control is adequate. 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 We have been using silver alginate as the primary dressing. He claims compliance with his compression pumps 2/4; patient has superficial open wounds on numerous areas of his left calf and again one on the left dorsal foot. The areas on the right lateral calf have healed. The cellulitis that I gave him doxycycline for last week is also resolved this was mostly on the left anterior calf just below the tibial tuberosity. His edema looks fairly well-controlled. He tells me he went to see his primary doctor today and had blood work ordered 2/11; once again he has several open areas on the left calf left tibial area. Most of these are small and appear to have healthy granulation. He does not have anything open on the right. The edema and control in his thighs is pretty good which is usually a good indication he has been using his pumps as requested. 2/18; he continues to have several small areas on the left calf and left tibial area. Most of these are small healthy granulation. We put him in his stocking on the right leg last week and he arrives with a superficial open area over the right upper tibia and a fairly large area on the right lateral tibia in similar condition. His edema control actually does not look too bad, he claims to be using his compression pumps twice a day 2/25. Continued small areas on the left calf and left tibial area. New areas especially on the right are identified just below the tibial tuberosity and on the  right upper tibia itself. There are also areas of weeping edema fluid even without an obvious wound. He does not have a considerable degree of lymphedema but clearly there is more edema here than his skin can handle. He states he is using the pumps twice a day. We have an Unna boot on the right and 4 layer compression on the left. 3/3; he continues to have an area on the right lateral calf and right posterior calf just below the popliteal fossa. There is a fair amount of tenderness around the wound on the popliteal fossa but I did not see any evidence of cellulitis, could just be that the wrap came down and rubbed in this area. ooHe does not have an open area on the left leg however there is an area on the left dorsal foot at the base of the third toe ooWe have been using silver alginate to all wound areas 3/10; he did not have an open area on his left leg last time he was here a week ago. T oday he arrives with a horizontal wound just below the tibial tuberosity and an area on the left lateral calf. He has intense erythema and tenderness in this area. The area is on the right lateral calf and right posterior calf better than last week. We have been using silver alginate as usual 3/18 -  Patient returns with 3 small open areas on left calf, and 1 small open area on right calf, the skin looks ok with no significant erythema, he continues the UNA boot on right and 4 layer compression on left. The right lateral calf wound is closed , the right posterior is small area. we will continue silver alginate to the areas. 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 He is been tolerating the dressing changes without complication. Fortunately she has made excellent improvement in regard to the overall status of his  wounds. Unfortunately every time we cease wrapping him he ends up reopening in causing more significant issues at that point. Again I'm unsure of the best direction to take although I think the lymphedema clinic may be appropriate for him. 02/03/19 on evaluation today patient appears to be doing well in regard to the wounds that we saw him for last week unfortunately he has a new area on the proximal portion of his right medial/posterior lower extremity where the wrap somewhat slowed down and caused swelling and a blister to rub and open. Unfortunately this is the only opening that he has on either leg at this point. 02/17/19 on evaluation today patient's bilateral lower extremities appear to be doing well. He still completely healed in regard to the left lower extremity. In regard to the right lower extremity the area where the wrap and slid down and caused the blister still seems to be slightly open although this is dramatically better than during the last evaluation two weeks ago. I'm very pleased with the way this stands overall. 03/03/19 on evaluation today patient appears to be doing well in regard to his right lower extremity in general although he did have a new blister open this does not appear to be showing any evidence of active infection at this time. Fortunately there's No fevers, chills, nausea, or vomiting noted at this time. Overall I feel like he is making good progress it does feel like that the right leg will we perform the [employer] seems to do with a bit better than three layer wrap on the left which slid down on him. We may switch to doing bilateral in the book wraps. 5/4; I have not seen Mr. Vasconez in quite some time. According to our case manager he did not have an open wound on his left leg last week. He had 1 remaining wound on the right posterior medial calf. He arrives today with multiple openings on the left leg probably were blisters and/or wrap injuries from Unna boots. I do  not think the Unna boot's will provide adequate compression on the left. I am also not clear about the frequency he is using the compression pumps. 03/17/19 on evaluation today patient appears to be doing excellent in regard to his lower extremities compared to last week's evaluation apparently. He had gotten significantly worse last week which is unfortunate. The [employer] wrap on the left did not seem to do very well for him at all and in fact it didn't control his swelling significantly enough he had an additional outbreak. Subsequently we go back to the four layer compression wrap on the left. This is good news. At least in that he is doing better and the wound seem to be killing him. He still has not  heard anything from the lymphedema clinic. 03/24/19 on evaluation today patient actually appears to be doing much better in regard to his bilateral lower Trinity as compared to last week when I saw him. Fortunately there's no signs of active infection at this time. He has been tolerating the dressing changes without complication. Overall I'm extremely pleased with the progress and appearance in general. 04/07/19 on evaluation today patient appears to be doing well in regard to his bilateral lower extremities. His swelling is significantly down from where it was previous. With that being said he does have a couple blisters still open at this point but fortunately nothing that seems to be too severe and again the majority of the larger openings has healed at this time. 04/14/19 on evaluation today patient actually appears to be doing quite well in regard to his bilateral lower extremities in fact I'm not even sure there's anything significantly open at this time at any site. Nonetheless he did have some trouble with these wraps where they are somewhat irritating him secondary to the fact that he has noted that the graph wasn't too close down to the end of this foot in a little bit short as well up to his  knee. Otherwise things seem to be doing quite well. 04/21/19 upon evaluation today patient's wound bed actually showed evidence of being completely healed in regard to both lower extremities which is excellent news. There does not appear to be any signs of active infection which is also good news. I'm very pleased in this regard. No fevers, chills, nausea, or vomiting noted at this time. 04/28/19 on evaluation today patient appears to be doing a little bit worse in regard to both lower extremities on the left mainly due to the fact that when he went infection disease the wrap was not wrapped quite high enough he developed a blister above this. On the right he is a small open area of nothing too significant but again this is continuing to give him some trouble he has been were in the Velcro compression that he has at home. 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 He's been tolerating the dressing changes without complication. Fortunately there's no signs of active infection at this time. No fevers, chills, nausea, or vomiting noted at this time. We have been trying to get an appointment with her lymphedema clinic in [facility] but unfortunately nobody can get them on phone with not been able to even fax information over the patient likewise is not been able to get in touch with them. Overall I'm not sure exactly what's going on here with to reach out again today. 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 Still having a lot of drainage unfortunately. He tells me especially in the left but even on the right. There's no signs of active infection which is good news we've been using so ratcheted up to this point. 05/19/19 on evaluation today patient actually appears to be doing quite well with regard to his left lower extremity which is great  news. Fortunately in regard to the right lower extremity has an issues with his wrap and he subsequently did remove this from what I'm understanding. Nonetheless long story short is what he had rewrapped once he removed it subsequently had maggots underneath this wrap whenever he came in for evaluation today. With that being  said they were obviously completely cleaned away by the nursing staff. The visit today which is excellent news. However he does appear to potentially have some infection around the right ankle region where the maggots were located as well. He will likely require anabiotic therapy today. 05/26/19 on evaluation today patient actually appears to be doing much better in regard to his bilateral lower extremities. I feel like the infection is under much better control. With that being said there were maggots noted when the wrap was removed yet again today. Again this could have potentially been left over from previous although at this time there does not appear to be any signs of significant drainage there was obviously on the wrap some drainage as well this contracted gnats or otherwise. Either way I do not see anything that appears to be doing worse in my pinion and in fact I think his drainage has slowed down quite significantly likely mainly due to the fact to his infection being under better control. 06/02/2019 on evaluation today patient actually appears to be doing well with regard to his bilateral lower extremities there is no signs of active infection at this time which is great news. With that being said he does have several open areas more so on the right than the left but nonetheless these are all significantly better than previously noted. 06/09/2019 on evaluation today patient actually appears to be doing well. His wrap stayed up and he did not cause any problems he had more drainage on the right compared to the left but overall I do not see any major issues at this time which  is great news. 06/16/2019 on evaluation today patient appears to be doing excellent with regard to his lower extremities the only area that is open is a new blister that can have opened as of today on the medial ankle on the left. Other than this he really seems to be doing great I see no major issues at this point. 06/23/2019 on evaluation today patient appears to be doing quite well with regard to his bilateral lower extremities. In fact he actually appears to be almost completely healed there is a small area of weeping noted of the right lower extremity just above the ankle. Nonetheless fortunately there is no signs of active infection at this time which is good news. No fevers, chills, nausea, vomiting, or diarrhea. 8/24; the patient arrived for a nurse visit today but complained of very significant pain in the left leg and therefore I was asked to look at this. Noted that he did not have an open area on the left leg last week nevertheless this was wrapped. The patient states that he is not been able to put his compression pumps on the left leg because of the discomfort. He has not been systemically unwell 06/30/2019 on evaluation today patient unfortunately despite being excellent last week is doing much worse with regard to his left lower extremity today. In fact he had to come in for a nurse on Monday where his left leg had to be rewrapped due to excessive weeping Dr. Leanord Hawking placed him on doxycycline at that point. Fortunately there is no signs of active infection Systemically at this time which is good news. 07/07/2019 in regard to the patient's wounds today he actually seems to be doing well with his right lower extremity there really is nothing open or draining at this point this is great news. Unfortunately the left lower extremity is given him additional trouble at  this time. There does not appear to be any signs of active infection nonetheless he does have a lot of edema and swelling noted at  this point as well as blistering all of which has led to a much more poor appearing leg at this time compared to where it was 2 weeks ago when it was almost completely healed. Obviously this is a little discouraging for the patient. He is try to contact the lymphedema clinic in [place] he has not been able to get through to them. 07/14/2019 on evaluation today patient actually appears to be doing slightly better with regard to his left lower extremity ulcers. Overall I do feel like at least at the top of the wrap that we have been placing this area has healed quite nicely and looks much better. The remainder of the leg is showing signs of improvement. Unfortunately in the thigh area he still has an open region on the left and again on the right he has been utilizing just a Band-Aid on an area that also opened on the thigh. Again this is an area that were not able to wrap although we did do an Ace wrap to provide some compression that something that obviously is a little less effective than the compression wraps we have been using on the lower portion of the leg. He does have an appointment with the lymphedema clinic in [facility] on Friday. 07/21/2019 on evaluation today patient appears to be doing better with regard to his lower extremity ulcers. He has been tolerating the dressing changes without complication. Fortunately there is no signs of active infection at this time. No fevers, chills, nausea, vomiting, or diarrhea. I did receive the paperwork from the physical therapist at the lymphedema clinic in [place]. Subsequently I signed off on that this morning and sent that back to him for further progression with the treatment plan. 07/28/2019 on evaluation today patient appears to be doing very well with regard to his right lower extremity where I do not see any open wounds at this point. Fortunately he is feeling great as far as that is concerned as well. In regard to the left lower  extremity he has been having issues with still several areas of weeping and edema although the upper leg is doing better his lower leg still I think is going require the compression wrap at this time. No fevers, chills, nausea, vomiting, or diarrhea. 08/04/2019 on evaluation today patient unfortunately is having new wounds on the right lower extremity. Again we have been using Unna boot wrap on that side. We switched him to using his juxta lite wrap at home. With that being said he tells me he has been using it although his legs extremely swollen and to be honest really does not appear that he has been. I cannot know that for sure however. Nonetheless he has multiple new wounds on the right lower extremity at this time. Obviously we will have to see about getting this rewrapped for him today. 08/11/2019 on evaluation today patient appears to be doing fairly well with regard to his wounds. He has been tolerating the dressing changes including the compression wraps without complication. He still has a lot of edema in his upper thigh regions bilaterally he is supposed to be seeing the lymphedema clinic on the 15th of this month once his wraps arrive for the upper part of his legs. 08/18/2019 on evaluation today patient appears to be doing well with regard to his bilateral lower extremities at  this point. He has been tolerating the dressing changes without complication. Fortunately there is no signs of active infection which is also good news. He does have a couple weeping areas on the first and second toe of the right foot he also has just a small area on the left foot upper leg and a small area on the left lower leg but overall he is doing quite well in my opinion. He is supposed to be getting his wraps shortly in fact tomorrow and then subsequently is seeing the lymphedema clinic next Wednesday on the 21st. Of note he is also leaving on the 25th to go on vacation for a week to the beach. For that reason  and since there is some uncertainty about what there can be doing at lymphedema clinic next Wednesday I am get a make an appointment for next Friday here for [place] to see what we need to do for him prior to him leaving for vacation. 10/23; patient arrives in considerable pain predominantly in the upper posterior calf just distal to the popliteal fossa also in the wound anteriorly above the major wound. This is probably cellulitis and he has had this recurrently in the past. He has no open wound on the right side and he has had an Radio broadcast assistant in that area. Finally I note that he has an area on the left posterior calf which by enlarge is mostly epithelialized. This protrudes beyond the borders of the surrounding skin in the setting of dry scaly skin and lymphedema. The patient is leaving for [facility] on Sunday. Per his longstanding pattern, he will not take his compression pumps with him predominantly out of fear that they will be stolen. He therefore asked that we put a Unna boot back on the right leg. He will also contact the wound care center in [facility] to see if they can change his dressing in the mid week. 11/3; patient returned from his vacation to [facility]. He was seen on 1 occasion at their wound care center. They did a 2 layer compression system as they did not have our 4-layer wrap. I am not completely certain what they put on the wounds. They did not change the Unna boot on the right. The patient is also seeing a lymphedema specialist physical therapist in [place]. It appears that he has some compression sleeve for his thighs which indeed look quite a bit better than I am used to seeing. He pumps over these with his external compression pumps. 11/10; the patient has a new wound on the right medial thigh otherwise there is no open areas on the right. He has an area on the left leg posteriorly anteriorly and medially and an area over the left second toe. We have been using silver  alginate. He thinks the injury on his thigh is secondary to friction from the compression sleeve he has. 11/17; the patient has a new wound on the right medial thigh last week. He thinks this is because he did not have a underlying stocking for his thigh juxta lite apparatus. He now has this. The area is fairly large and somewhat angry but I do not think he has underlying cellulitis. ooHe has a intact blister on the right anterior tibial area. ooSmall wound on the right great toe dorsally ooSmall area on the medial left calf. 11/30; the patient does not have any open areas on his right leg and we did not take his juxta lite stocking off. However he  states that on Friday his compression wrap fell down lodging around his upper mid calf area. As usual this creates a lot of problems for him. He called urgently today to be seen for a nurse visit however the nurse visit turned into a provider visit because of extreme erythema and pain in the left anterior tibia extending laterally and posteriorly. The area that is problematic is extensive 10/06/2019 upon evaluation today patient actually appears to be doing poorly in regard to his left lower extremity. He Dr. Leanord Hawking did place him on doxycycline this past Monday apparently due to the fact that he was doing much worse in regard to this left leg. Fortunately the doxycycline does seem to be helping. Unfortunately we are still having a very difficult time getting his edema under any type of control in order to anticipate discharge at some point. The only way were really able to control his lymphedema really is with compression wraps and that has only even seemingly temporary. He has been seeing a lymphedema clinic they are trying to help in this regard but still this has been somewhat frustrating in general for the patient. 10/13/19 on evaluation today patient appears to be doing excellent with regard to his right lower extremity as far as the wounds are  concerned. His swelling is still quite extensive unfortunately. He is still having a lot of drainage from the thigh areas bilaterally which is unfortunate. He's been going to lymphedema clinic but again he still really does not have this edema under control as far as his lower extremities are concern. With regard to his left lower extremity this seems to be improving and I do believe the doxycycline has been of benefit for him. He is about to complete the doxycycline. 10/20/2019 on evaluation today patient appears to be doing poorly in regard to his bilateral lower extremities. More in the right thigh he has a lot of irritation at this site unfortunately. In regard to the left lower extremity the wrap was not quite as high it appears and does seem to have caused him some trouble as well. Fortunately there is no evidence of systemic infection though he does have some blue-green drainage which has me concerned for the possibility of Pseudomonas. He tells me he is previously taking Cipro without complications and he really does not care for Levaquin however due to some of the side effects he has. He is not allergic to any medications specifically antibiotics that were aware of. 10/27/2019 on evaluation today patient actually does appear to be for the most part doing better when compared to last week's evaluation. With that being said he still has multiple open wounds over the bilateral lower extremities. He actually forgot to start taking the Cipro and states that he still has the whole bottle. He does have several new blisters on left lower extremity today I think I would recommend he go ahead and take the Cipro based on what I am seeing at this point. 12/30-Patient comes at 1 week visit, 4 layer compression wraps on the left and Unna boot on the right, primary dressing Xtrasorb and silver alginate. Patient is taking his Cipro and has a few more days left probably 5-6, and the legs are doing better. He  states he is using his compressions devices which I believe he has 11/10/2019 on evaluation today patient actually appears to be much better than last time I saw him 2 weeks ago. His wounds are significantly improved and overall I am very  pleased in this regard. Fortunately there is no signs of active infection at this time. He is just a couple of days away from completing Cipro. Overall his edema is much better he has been using his lymphedema pumps which I think is also helping at this point. 11/17/2019 on evaluation today patient appears to be doing excellent in regard to his wounds in general. His legs are swollen but not nearly as much as they have been in the past. Fortunately he is tolerating the compression wraps without complication. No fevers, chills, nausea, vomiting, or diarrhea. He does have some erythema however in the distal portion of his right lower extremity specifically around the forefoot and toes there is a little bit of warmth here as well. 11/24/2019 on evaluation today patient appears to be doing well with regard to his right lower extremity I really do not see any open wounds at this point. His left lower extremity does have several open areas and his right medial thigh also is open. Other than this however overall the patient seems to be making good progress and I am very pleased at this point. 12/01/2019 on evaluation today patient appears to be doing poorly at this point in regard to his left lower extremity has several new blisters despite the fact that we have him in compression wraps. In fact he had a 4-layer compression wrap, his upper thigh wrapped from lymphedema clinic, and a juxta light over top of the 4 layer compression wrap the lymphedema clinic applied and despite all this he still develop blisters underneath. Obviously this does have me concerned about the fact that unfortunately despite what we are doing to try to get wounds healed he continues to have new areas  arise I do not think he is ever good to be at the point where he can realistically just use wraps at home to keep things under control. Typically when we heal him it takes about 1-2 days before he is back in the clinic with severe breakdown and blistering of his lower extremities bilaterally. This is happened numerous times in the past. Unfortunately I think that we may need some help as far as overall fluid overload to kind of limit what we are seeing and get things under better control. 12/08/2019 on evaluation today patient presents for follow-up concerning his ongoing bilateral lower extremity edema. Unfortunately he is still having quite a bit of swelling the compression wraps are controlling this to some degree but he did see Dr. [name] his cardiologist I do have that available for review today as far as the appointment was concerned that was on 12/06/2019. Obviously that she has been 2 days ago. The patient states that he is only been taking the Lasix 80 mg 1 time a day he had told me previously he was taking this twice a day. Nonetheless Dr. [name] recommended this be up to 80 mg 2 times a day for the patient as he did appear to be fluid overloaded. With that being said the patient states he did this yesterday and he was unable to go anywhere or do anything due to the fact that he was constantly having to urinate. Nonetheless I think that this is still good to be something that is important for him as far as trying to get his edema under control at all things that he is going to be able to just expect his wounds to get under control and things to be better without going through at least a  period of time where he is trying to stabilize his fluid management in general and I think increasing the Lasix is likely the first step here. It was also mentioned the possibility that the patient may require metolazone. With that being said he wanted to have the patient take Lasix twice a day first and  then reevaluating 2 months to see where things stand. 12/15/2019 upon evaluation today patient appears to be doing regard to his legs although his toes are showing some signs of weeping especially on the left at this point to some degree on the right. There does not appear to be any signs of active infection and overall I do feel like the compression wraps are doing well for him but he has not been able to take the Lasix at home and the increased dose that Dr. [name] recommended. He tells me that just not go to be feasible for him. Nonetheless I think in this case he should probably send a message to Dr. [name] in order to discuss options from the standpoint of possible admission to get the fluid off or otherwise going forward. 12/22/2019 upon evaluation today patient appears to be doing fairly well with regard to his lower extremities at this point. In fact he would be doing excellent if it was not for the fact that his right anterior thigh apparently had an allergic reaction to adhesive tape that he used. The wound itself that we have been monitoring actually appears to be healed. There is a lot of irritation at this point. 12/29/2019 upon evaluation today patient appears to be doing well in regard to his lower extremities. His left medial thigh is open and somewhat draining today but this is the only region that is open the right has done much better with the treatment utilizing the steroid cream that I prescribed for him last week. Overall I am pleased in that regard. Fortunately there is no signs of active infection at this time. No fevers, chills, nausea, vomiting, or diarrhea. 01/05/2020 upon evaluation today patient appears to be doing more poorly in regard to his right lower extremity at this point upon evaluation today. Unfortunately he continues to have issues in this regard and I think the biggest issue is controlling his edema. This obviously is not very well controlled at this point is  been recommended that he use the Lasix twice a day but he has not been able to do that. Unfortunately I think this is leading to an issue where honestly he is not really able to effectively control his edema and therefore the wounds really are not doing significantly better. I do not think that he is going to be able to keep things under good control unless he is able to control his edema much better. I discussed this again in great detail with him today. 01/12/2020 good news is patient actually appears to be doing quite well today at this point. He does have an appointment with lymphedema clinic tomorrow. His legs appear healed and the toe on the left is almost completely healed. In general I am very pleased with how things stand at this point. 01/19/2020 upon evaluation today patient appears to actually be doing well in regard to his lower extremities there is nothing open at this point. Fortunately he has done extremely well more recently. Has been seeing lymphedema clinic as well. With that being said he has Velcro wraps for his lower legs as well as his upper legs. The only wound really is  on his toe which is the right great toe and this is barely anything even there. With all that being said I think it is good to be appropriate today to go ahead and switch him over to the Velcro compression wraps. 01/26/2020 upon evaluation today patient appears to be doing worse with regard to his lower extremities after last week switch him to Velcro compression wraps. Unfortunately he lasted less than 24 hours he did not have the sock portion of his Velcro wrap on the left leg and subsequently developed a blister underneath the Velcro portion. Obviously this is not good and not what we were looking for at this point. He states the lymphedema clinic did tell him to wear the wrap for 23 hours and take him off for 1 I am okay with that plan but again right now we got a get things back under control again he may have  some cellulitis noted as well. 02/02/2020 upon evaluation today patient unfortunately appears to have several areas of blistering on his bilateral lower extremities today mainly on the feet. His legs do seem to be doing somewhat better which is good news. Fortunately there is no evidence of active infection at this time. No fevers, chills, nausea, vomiting, or diarrhea. 02/16/2020 upon evaluation today patient appears to be doing well at this time with regard to his legs. He has a couple weeping areas on his toes but for the most part everything is doing better and does appear to be sealed up on his legs which is excellent news. We can continue with wrapping him at this point as he had every time we discontinue the wraps he just breaks out with new wounds. There is really no point in is going forward with this at this point. 03/08/2020 upon evaluation today patient actually appears to be doing quite well with regard to his lower extremity ulcers. He has just a very superficial and really almost nonexistent blister on the left lower extremity he has in general done very well with the compression wraps. With that being said I do not see any signs of infection at this time which is good news. 03/29/2020 upon evaluation today patient appears to be doing well with regard to his wounds currently except for where he had several new areas that opened up due to some of the wrap slipping and causing him trouble. He states he did not realize they had slipped. Nonetheless he has a 1 area on the right and 3 new areas on the left. Fortunately there is no signs of active infection at this time which is great news. 04/05/2020 upon evaluation today patient actually appears to be doing quite well in general in regard to his legs currently. Fortunately there is no signs of active infection at this time. No fevers, chills, nausea, vomiting, or diarrhea. He tells me next week that he will actually be seen in the lymphedema  clinic on Thursday at 10 AM I see him on Wednesday next week. 04/12/2020 upon evaluation today patient appears to be doing very well with regard to his lower extremities bilaterally. In fact he does not appear to have any open wounds at this point which is good news. Fortunately there is no signs of active infection at this time. No fevers, chills, nausea, vomiting, or diarrhea. 04/19/2020 upon evaluation today patient appears to be doing well with regard to his wounds currently on the bilateral lower extremities. There does not appear to be any signs of active infection  at this time. Fortunately there is no evidence of systemic infection and overall very pleased at this point. Nonetheless after I held him out last week he literally had blisters the next morning already which swelled up with him being right back here in the clinic. Overall I think that he is just not can be able to be discharged with his legs the way they are he is much to volume overloaded as far as fluid is concerned and that was discussed with him today of also discussed this but should try the clinic nurse manager as well as Dr. Leanord Hawking. 04/26/2020 upon evaluation today patient appears to be doing better with regard to his wounds currently. He is making some progress and overall swelling is under good control with the compression wraps. Fortunately there is no evidence of active infection at this time. 05/10/2020 on evaluation today patient appears to be doing overall well in regard to his lower extremities bilaterally. He is Tolerating the compression wraps without complication and with what we are seeing currently I feel like that he is making excellent progress. There is no signs of active infection at this time. 05/24/2020 upon evaluation today patient appears to be doing well in regard to his legs. The swelling is actually quite a bit down compared to where it has been in the past. Fortunately there is no sign of active infection at  this time which is also good news. With that being said he does have several wounds on his toes that have opened up at this point. 05/31/2020 upon evaluation today patient appears to be doing well with regard to his legs bilaterally where he really has no significant fluid buildup at this point overall he seems to be doing quite well. Very pleased in this regard. With regard to his toes these also seem to be drying up which is excellent. We have continue to wrap him as every time we tried as a transition to the juxta light wraps things just do not seem to get any better. 06/07/2020 upon evaluation today patient appears to be doing well with regard to his right leg at this point. Unfortunately left leg has a lot of blistering he tells me the wrap started to slide down on him when he tried to put his other Velcro wrap over top of it to help keep things in order but nonetheless still had some issues. Objective Constitutional Well-nourished and well-hydrated in no acute distress. Vitals Time Taken: 10:40 AM, Height: 70 in, Weight: 380.2 lbs, BMI: 54.5, Temperature: 97.8 F, Pulse: 73 bpm, Respiratory Rate: 18 breaths/min, Blood Pressure: 172/66 mmHg. Respiratory normal breathing without difficulty. Psychiatric this patient is able to make decisions and demonstrates good insight into disease process. Alert and Oriented x 3. pleasant and cooperative. General Notes: Upon inspection patient's wound bed actually showed signs of good granulation and a lot of the areas where the blistering was there does not appear to be any evidence of infection again this is in regard to the left leg. In regard to the right leg he seems to be doing fairly well at this time in fact there is no blistering everything looks excellent. Integumentary (Hair, Skin) Wound #174 status is Healed - Epithelialized. Original cause of wound was Blister. The wound is located on the Left,Dorsal Foot. The wound measures 0cm length x 0cm  width x 0cm depth; 0cm^2 area and 0cm^3 volume. There is no tunneling or undermining noted. There is a none present amount of drainage noted. There  is no granulation within the wound bed. There is no necrotic tissue within the wound bed. Wound #177 status is Open. Original cause of wound was Blister. The wound is located on the Right T Great. The wound measures 1.5cm length x 2.2cm oe width x 0.1cm depth; 2.592cm^2 area and 0.259cm^3 volume. Wound #178 status is Open. Original cause of wound was Gradually Appeared. The wound is located on the Right T Second. The wound measures 1cm length oe x 1cm width x 0.1cm depth; 0.785cm^2 area and 0.079cm^3 volume. There is no tunneling or undermining noted. There is a none present amount of drainage noted. The wound margin is flat and intact. There is no granulation within the wound bed. There is no necrotic tissue within the wound bed. Wound #179 status is Healed - Epithelialized. Original cause of wound was Blister. The wound is located on the Left T Fourth. The wound measures 0cm oe length x 0cm width x 0cm depth; 0cm^2 area and 0cm^3 volume. There is no tunneling or undermining noted. There is a none present amount of drainage noted. There is no granulation within the wound bed. There is no necrotic tissue within the wound bed. Wound #180 status is Open. Original cause of wound was Blister. The wound is located on the Left,Proximal,Dorsal Foot. The wound measures 1cm length x 1.2cm width x 0.1cm depth; 0.942cm^2 area and 0.094cm^3 volume. There is no tunneling or undermining noted. There is a medium amount of serosanguineous drainage noted. There is large (67-100%) red granulation within the wound bed. There is no necrotic tissue within the wound bed. Wound #181 status is Open. Original cause of wound was Blister. The wound is located on the Left T Second. The wound measures 1.6cm length x 1cm width oe x 0.1cm depth; 1.257cm^2 area and 0.126cm^3  volume. 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 There was a Four Layer Compression Therapy Procedure by Yevonne Pax, RN. Post procedure Diagnosis Wound #180: Same as Pre-Procedure There was a Radio broadcast assistant Compression Therapy Procedure by Yevonne Pax, RN. Post procedure Diagnosis Wound #: Same as Pre-Procedure Plan Follow-up Appointments: Return Appointment in 1 week. Dressing Change Frequency: Do not change entire dressing for one week. - both legs Skin Barriers/Peri-Wound Care: Moisturizing lotion - both legs and feet and toes Wound Cleansing: May shower with protection. Primary Wound Dressing: Wound #177 Right T Great: oe Calcium Alginate with Silver - and any blistered areas on legs Wound #178 Right T Second: oe Calcium Alginate with Silver - and any blistered areas on legs Calcium Alginate with Silver Wound #180 Left,Proximal,Dorsal Foot: Calcium Alginate with Silver - and any blistered areas on legs Wound #181 Left T Second: oe Calcium Alginate with Silver - and any blistered areas on legs Secondary Dressing: Wound #180 Left,Proximal,Dorsal Foot: Dry Gauze Wound #177 Right T Great: oe Kerlix/Rolled Gauze Dry Gauze Wound #178 Right T Second: oe Kerlix/Rolled Gauze Dry Gauze Wound #181 Left T Second: oe Kerlix/Rolled Gauze Dry Gauze Edema Control: 4 layer compression: Left lower extremity Unna Boot to Right  Lower Extremity Avoid standing for long periods of time Elevate legs to the level of the heart or above for 30 minutes daily and/or when sitting, a frequency of: - throughout the day Exercise regularly 1. I would recommend currently that we go ahead and initiate treatment with a continuation of the compression wraps bilaterally. I still think that the best thing to do I do not believe there is any signs of infection some not can initiate anything treatment wise for infection at this point although we will keep an eye on this. 2. I am also can recommend that we continue to have the patient elevate his legs is much as possible as well as continue with his Lasix obviously I think this is of utmost importance to keep under control. 3. I would also suggest that he continue to ambulate as much as possible to try to help with clearing fluid out of his legs. We will see patient back for reevaluation in 1 week here in the clinic. If anything worsens or changes patient will contact our office for additional recommendations. Electronic Signature(s) Signed: 06/07/2020 4:54:27 PM By: Zenaida Deed RN, BSN Signed: 06/07/2020 5:10:43 PM By: Lenda Kelp PA-C Previous Signature: 06/07/2020 11:26:22 AM Version By: Lenda Kelp PA-C Entered By: Zenaida Deed on 06/07/2020 11:27:33 -------------------------------------------------------------------------------- SuperBill Details Patient Name: Date of Service: [facility], Pedram J. 06/07/2020 Medical Record Number: [id number] Patient Account Number: [id number] Date of Birth/Sex: Treating RN: 11/24/1950 (69 y.o. [name] Primary Care Provider: Marney Setting, [name] Other Clinician: Referring Provider: Treating Provider/Extender: Lenda Kelp [facility] [name], [name] in Treatment: 228 Diagnosis Coding ICD-10 Codes Code Description [phone] Non-pressure chronic ulcer of right calf limited to breakdown of skin L97.221 Non-pressure chronic ulcer of left  calf limited to breakdown of skin 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 of bilateral lower extremity I89.0 Lymphedema, not elsewhere classified E11.622 Type 2 diabetes mellitus with other skin ulcer 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, unspecified L03.116 Cellulitis of left lower limb Facility Procedures CPT4 Code: [id number] Description: (Facility Use Only) [phone] - APPLY UNNA BOOT RT Modifier: Quantity: 1 CPT4 Code: [id number] Description: (Facility Use Only) [id number] - APPLY MULTLAY COMPRS LWR LT LEG Modifier: 59 Quantity: 1 Physician Procedures : CPT4 Code Description Modifier [id number] - WC PHYS LEVEL 3 - EST PT ICD-10 Diagnosis Description 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 skin L97.221 Non-pressure chronic ulcer of left calf limited to breakdown of  skin 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

## 2020-06-12 ENCOUNTER — Other Ambulatory Visit: Payer: Self-pay | Admitting: Family Medicine

## 2020-06-12 MED ORDER — HYDROCODONE-ACETAMINOPHEN 5-325 MG PO TABS: ORAL_TABLET | ORAL | 0 refills | Status: DC

## 2020-06-12 NOTE — Telephone Encounter (Signed)
Patient notified by pharmacy regarding Rx.

## 2020-06-12 NOTE — Telephone Encounter (Signed)
  LAST APPOINTMENT DATE: 04/12/2020   NEXT APPOINTMENT DATE:@9 /06/2020  MEDICATION: HYDROcodone-acetaminophen (NORCO/VICODIN) 5-325 MG tablet  PHARMACY: [facility] - [place], [place] - [address]    Please advise

## 2020-06-12 NOTE — Telephone Encounter (Signed)
Requesting: Hydrocodone Contract:04/12/20 UDS:n/a Last Visit:04/12/20 Next Visit:07/12/20 Last Refill:6/9/2(60,0)  Please Advise. Medication pending

## 2020-06-14 ENCOUNTER — Encounter (HOSPITAL_BASED_OUTPATIENT_CLINIC_OR_DEPARTMENT_OTHER): Payer: Medicare Other | Admitting: Physician Assistant

## 2020-06-14 DIAGNOSIS — I272 Pulmonary hypertension, unspecified: Secondary | ICD-10-CM | POA: Diagnosis not present

## 2020-06-14 DIAGNOSIS — L03116 Cellulitis of left lower limb: Secondary | ICD-10-CM | POA: Diagnosis not present

## 2020-06-14 DIAGNOSIS — I482 Chronic atrial fibrillation, unspecified: Secondary | ICD-10-CM | POA: Diagnosis not present

## 2020-06-14 DIAGNOSIS — L97221 Non-pressure chronic ulcer of left calf limited to breakdown of skin: Secondary | ICD-10-CM | POA: Diagnosis not present

## 2020-06-14 DIAGNOSIS — E1140 Type 2 diabetes mellitus with diabetic neuropathy, unspecified: Secondary | ICD-10-CM | POA: Diagnosis not present

## 2020-06-14 DIAGNOSIS — I890 Lymphedema, not elsewhere classified: Secondary | ICD-10-CM | POA: Diagnosis not present

## 2020-06-14 DIAGNOSIS — L97211 Non-pressure chronic ulcer of right calf limited to breakdown of skin: Secondary | ICD-10-CM | POA: Diagnosis not present

## 2020-06-14 DIAGNOSIS — I87333 Chronic venous hypertension (idiopathic) with ulcer and inflammation of bilateral lower extremity: Secondary | ICD-10-CM | POA: Diagnosis not present

## 2020-06-14 DIAGNOSIS — Z9119 Patient's noncompliance with other medical treatment and regimen: Secondary | ICD-10-CM | POA: Diagnosis not present

## 2020-06-14 DIAGNOSIS — I872 Venous insufficiency (chronic) (peripheral): Secondary | ICD-10-CM | POA: Diagnosis not present

## 2020-06-14 DIAGNOSIS — E11622 Type 2 diabetes mellitus with other skin ulcer: Secondary | ICD-10-CM | POA: Diagnosis not present

## 2020-06-14 NOTE — Progress Notes (Addendum)
[name], [name] ([id number]) Visit Report for 06/14/2020 Chief Complaint Document Details Patient Name: Date of Service: [name], [name] 06/14/2020 10:15 A M Medical Record Number: [id number] Patient Account Number: [id number] Date of Birth/Sex: Treating RN: 24-Feb-1951 (69 y.o. [name] Primary Care Provider: Marney Setting, [name] Other Clinician: Referring Provider: Treating Provider/Extender: Lenda Kelp [name], [name] in Treatment: 229 Information Obtained from: Patient Chief Complaint patient is here for evaluation venous/lymphedema weeping Electronic Signature(s) Signed: 06/14/2020 10:17:36 AM By: Lenda Kelp PA-C Entered By: Lenda Kelp on 06/14/2020 10:17:36 -------------------------------------------------------------------------------- HPI Details Patient Name: Date of Service: [name], [name] 06/14/2020 10:15 A M Medical Record Number: [id number] Patient Account Number: [id number] Date of Birth/Sex: Treating RN: [birth year] (69 y.o. [name] Primary Care Provider: Marney Setting, [name] Other Clinician: Referring Provider: Treating Provider/Extender: Lenda Kelp [name], [name] in Treatment: 229 History of Present Illness HPI Description: Referred by PCP for consultation. Patient has long standing history of BLE venous stasis, no prior ulcerations. At beginning of month, developed cellulitis and weeping. Received IM Rocephin followed by Keflex and resolved. Wears compression stocking, appr 6 months old. Not sure strength. No present drainage. 01/22/16 this is a patient who is a type II diabetic on insulin. 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 He tells me he religiously wears pressure stockings of uncertain strength. He was here with weeping edema about 8 months ago but did not have an open wound. Roughly a month ago he had a reopening on his bilateral legs. He is been using bandages and Neosporin. He  does not complain of pain. 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 Last BUN/creatinine I have is from 11/20/15 at 13 and 1.0 respectively 01/29/16; patient arrives today having tolerated the Profore wrap. He brought in his stockings and these are 18 mmHg stockings he bought from [place]. The compression here is likely inadequate. He does not complain of pain or excessive drainage she has no systemic symptoms. The wound on the right looks improved as does the one on the left although one on the left is more substantial with still tissue at risk below the actual wound area on the bilateral posterior calf 02/05/16; patient arrives with poor edema control. He states that we did put a 4 layer compression on it last week. No weight appear 5 this. 02/12/16; the area on the posterior right Has healed. The left Has a substantial wound that has necrotic surface eschar that requires a debridement with a curette. 02/16/16;the patient called or a Nurse visit secondary to increased swelling. He had been in earlier in the week with his right leg healed. He was transitioned to is on pressure stocking on the right leg with the only open wound on the left, a substantial area on the left posterior calf. Note he has a history of severe lower extremity edema, he has a history of chronic atrial fibrillation but not heart failure per my notes but I'll need to research this. He is not complaining of chest pain shortness of breath or orthopnea. The intake nurse noted blisters on the previously closed right leg 02/19/16; this is the patient's regular visit day. I see him on Friday with escalating edema new wounds on the right leg and clear signs of at least right ventricular heart failure. I increased his Lasix to  40 twice a day. He is returning currently in follow-up. States he is noticed a decrease in that the  edema 02/26/16 patient's legs have much less edema. There is nothing really open on the right leg. The left leg has improved condition of the large superficial wound on the posterior left leg 03/04/16; edema control is very much better. The patient's right leg wounds have healed. On the left leg he continues to have severe venous inflammation on the posterior aspect of the left leg. There is no tenderness and I don't think any of this is cellulitis. 03/11/16; patient's right leg is married healed and he is in his own stocking. The patient's left leg has deteriorated somewhat. There is a lot of erythema around the wound on the posterior left leg. There is also a significant rim of erythema posteriorly just above where the wrap would've ended there is a new wound in this location and a lot of tenderness. Can't rule out cellulitis in this area. 03/15/16; patient's right leg remains healed and he is in his own stocking. The patient's left leg is much better than last review. His major wound on the posterior aspect of his left Is almost fully epithelialized. He has 3 small injuries from the wraps. Really. Erythema seems a lot better on antibiotics 03/18/16; right leg remains healed and he is in his own stocking. The patient's left leg is much better. The area on the posterior aspect of the left calf is fully epithelialized. His 3 small injuries which were wrap injuries on the left are improved only one seems still open his erythema has resolved 03/25/16; patient's right leg remains healed and he is in his own stocking. There is no open area today on the left leg posterior leg is completely closed up. His wrap injuries at the superior aspect of his leg are also resolved. He looks as though he has some irritation on the dorsal ankle but this is fully epithelialized without evidence of infection. 03/28/16; we discharged this patient on Monday. Transitioned him into his own stocking. There were problems almost  immediately with uncontrolled swelling weeping edema multiple some of which have opened. He does not feel systemically unwell in particular no chest pain no shortness of breath and he does not feel 04/08/16; the edema is under better control with the Profore light wrap but he still has pitting edema. There is one large wound anteriorly 2 on the medial aspect of his left leg and 3 small areas on the superior posterior calf. Drainage is not excessive he is tolerating a Profore light well 04/15/16; put a Profore wrap on him last week. This is controlled is edema however he had a lot of pain on his left anterior foot most of his wounds are healed 04/22/16 once again the patient has denuded areas on the left anterior foot which he states are because his wrap slips up word. He saw his primary physician today is on Lasix 40 twice a day and states that he his weight is down 20 pounds over the last 3 months. 04/29/16: Much improved. left anterior foot much improved. He is now on Lasix 80 mg per day. Much improved edema control 05/06/16; I was hoping to be able to discharge him today however once again he has blisters at a low level of where the compression was placed last week mostly on his left lateral but also his left medial leg and a small area on the anterior part of the left foot. 05/09/16;  apparently the patient went home after his appointment on 7/4 later in the evening developing pain in his upper medial thigh together with subjective fever and chills although his temperature was not taken. The pain was so intense he felt he would probably have to call 911. However he then remembered that he had leftover doxycycline from a previous round of antibiotics and took these. By the next morning he felt a lot better. He called and spoke to one of our nurses and I approved doxycycline over the phone thinking that this was in relation to the wounds we had previously seen although they were definitely were not. The  patient feels a lot better old fever no chills he is still working. Blood sugars are reasonably controlled 05/13/16; patient is back in for review of his cellulitis on his anterior medial upper thigh. He is taking doxycycline this is a lot better. Culture I did of the nodular area on the dorsal aspect of his foot grew MRSA this also looks a lot better. 05/20/16; the patient is cellulitis on the medial upper thigh has resolved. All of his wound areas including the left anterior foot, areas on the medial aspect of the left calf and the lateral aspect of the calf at all resolved. He has a new blister on the left dorsal foot at the level of the fourth toe this was excised. No evidence of infection 05/27/16; patient continues to complain weeping edema. He has new blisterlike wounds on the left anterior lateral and posterior lateral calf at the top of his wrap levels. The area on his left anterior foot appears better. He is not complaining of fever, pain or pruritus in his feet. 05/30/16; the patient's blisters on his left anterior leg posterior calf all look improved. He did not increase the Lasix 100 mg as I suggested because he was going to run out of his 40 mg tablets. He is still having weeping edema of his toes 06/03/16; I renewed his Lasix at 80 mg once a day as he was about to run out when I last saw him. He is on 80 mg of Lasix now. I have asked him to cut down on the excessive amount of water he was drinking and asked him to drink according to his thirst mechanisms 06/12/2016 -- was seen 2 days ago and was supposed to wear his compression stockings at home but he is developed lymphedema and superficial blisters on the left lower extremity and hence came in for a review 06/24/16; the remaining wound is on his left anterior leg. He still has edema coming from between his toes. There is lymphedema here however his edema is generally better than when I last saw this. He has a history of atrial fibrillation  but does not have a known history of congestive heart failure nevertheless I think he probably has this at least on a diastolic basis. 07/01/16 I reviewed his echocardiogram from January 2017. This was essentially normal. He did not have LVH, EF of 55-60%. His right ventricular function was normal although he did have trivial tricuspid and pulmonic regurgitation. This is not audible on exam however. I increased his Lasix to do massive edema in his legs well above his knees I think in early July. He was also drinking an excessive amount of water at the time. 07/15/16; missed his appointment last week because of the Labor Day holiday on Monday. He could not get another appointment later in the week. Started to feel the wrap  digging in superiorly so we remove the top half and the bottom half of his wrap. He has extensive erythema and blistering superiorly in the left leg. Very tender. Very swollen. Edema in his foot with leaking edema fluid. He has not been systemically unwell 07/22/16; the area on the left leg laterally required some debridement. The medial wounds look more stable. His wrap injury wounds appear to have healed. Edema and his foot is better, weeping edema is also better. He tells me he is meeting with the supplier of the external compression pumps at work 08/05/16; the patient was on vacation last week in [facility]. His wrap is been on for an extended period of time. Also over the weekend he developed an extensive area of tender erythema across his anterior medial thigh. He took to doxycycline yesterday that he had leftover from a previous prescription. The patient complains of weeping edema coming out of his toes 08/08/16; I saw this patient on 10/2. He was tender across his anterior thigh. I put him on doxycycline. He returns today in follow-up. He does not have any open wounds on his lower leg, he still has edema weeping into his toes. 08/12/16; patient was seen back urgently today to  follow-up for his extensive left thigh cellulitis/erysipelas. He comes back with a lot less swelling and erythema pain is much better. I believe I gave him Augmentin and Cipro. His wrap was cut down as he stated a roll down his legs. He developed blistering above the level of the wrap that remained. He has 2 open blisters and 1 intact. 08/19/16; patient is been doing his primary doctor who is increased his Lasix from 40-80 once a day or 80 already has less edema. Cellulitis has remained improved in the left thigh. 2 open areas on the posterior left calf 08/26/16; he returns today having new open blisters on the anterior part of his left leg. He has his compression pumps but is not yet been shown how to use some vital representative from the supplier. 09/02/16 patient returns today with no open wounds on the left leg. Some maceration in his plantar toes 09/10/2016 -- Dr. Leanord Hawking had recently discharged him on 09/02/2016 and he has come right back with redness swelling and some open ulcers on his left lower extremity. He says this was caused by trying to apply his compression stockings and he's been unable to use this and has not been able to use his lymphedema pumps. He had some doxycycline leftover and he has started on this a few days ago. 09/16/16; there are no open wounds on his leg on the left and no evidence of cellulitis. 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 He does not complain of symptoms here. I am not clear use using his external compression pumps. 09/23/16; I have not seen this patient in 2 weeks. He canceled his appointment 10 days ago as he was going on vacation. He tells me that on Monday he noticed a large area on his posterior left leg which is been draining copiously and is reopened into a large wound. He is been using ABDs and the external part of his juxtalite, according to our nurse this was not on properly. 10/07/16;  Still a substantial area on the posterior left leg. Using silver alginate 10/14/16; in general better although there is still open area which looks healthy. Still using silver alginate. He reminds me that this happen before he  left for [facility]. T oday while he was showering in the morning. He had been using his juxtalite's 10/21/16; the area on his posterior left leg is fully epithelialized. However he arrives today with a large area of tender erythema in his medial and posterior left thigh just above the knee. I have marked the area. Once again he is reluctant to consider hospitalization. I treated him with oral antibiotics in the past for a similar situation with resolution I think with doxycycline however this area it seems more extensive to me. He is not complaining of fever but does have chills and says states he is thirsty. His blood sugar today was in the 140s at home 10/25/16 the area on his posterior left leg is fully epithelialized although there is still some weeping edema. The large area of tenderness and erythema in his medial and posterior left thigh is a lot less tender although there is still a lot of swelling in this thigh. He states he feels a lot better. He is on doxycycline and Augmentin that I started last week. This will continued until Tuesday, December 26. I have ordered a duplex ultrasound of the left thigh rule out DVT whether there is an abscess something that would need to be drained I would also like to know. 11/01/16; he still has weeping edema from a not fully epithelialized area on his left posterior calf. Most of the rest of this looks a lot better. He has completed his antibiotics. His thigh is a lot better. Duplex ultrasound did not show a DVT in the thigh 11/08/16; he comes in today with more Denuded surface epithelium from the posterior aspect of his calf. There is no real evidence of cellulitis. The superior aspect of his wrap appears to have put quite an  indentation in his leg just below the knee and this may have contributed. He does not complain of pain or fever. We have been using silver alginate as the primary dressing. The area of cellulitis in the right thigh has totally resolved. He has been using his compression stockings once a week 11/15/16; the patient arrives today with more loss of epithelium from the posterior aspect of his left calf. He now has a fairly substantial wound in this area. The reason behind this deterioration isn't exactly clear although his edema is not well controlled. He states he feels he is generally more swollen systemically. He is not complaining of chest pain shortness of breath fever. T me he has an appointment with his primary physician in early February. He is on 80 mg of oral ells Lasix a day. He claims compliance with the external compression pumps. He is not having any pain in his legs similar to what he has with his recurrent cellulitis 11/22/16; the patient arrives a follow-up of his large area on his left lateral calf. This looks somewhat better today. He came in earlier in the week for a dressing change since I saw him a week ago. He is not complaining of any pain no shortness of breath no chest pain 11/28/16; the patient arrives for follow-up of his large area on the left lateral calf this does not look better. In fact it is larger weeping edema. The surface of the wound does not look too bad. We have been using silver alginate although I'm not certain that this is a dressing issue. 12/05/16; again the patient follows up for a large wound on the left lateral and left posterior calf this does not  look better. There continues to be weeping edema necrotic surface tissue. More worrisome than this once again there is erythema below the wound involving the distal Achilles and heel suggestive of cellulitis. He is on his feet working most of the day of this is not going well. We are changing his dressing twice a week to  facilitate the drainage. 12/12/16; not much change in the overall dimensions of the large area on the left posterior calf. This is very inflamed looking. I gave him an. Doxycycline last week does not really seem to have helped. He found the wrap very painful indeed it seems to of dog into his legs superiorly and perhaps around the heel. He came in early today because the drainage had soaked through his dressings. 12/19/16- patient arrives for follow-up evaluation of his left lower extremity ulcers. He states that he is using his lymphedema pumps once daily when there is "no drainage". He admits to not using his lipedema pumps while under current treatment. His blood sugars have been consistently between 150-200. 12/26/16; the patient is not using his compression pumps at home because of the wetness on his feet. I've advised him that I think it's important for him to use this daily. He finds his feet too wet, he can put a plastic bag over his legs while he is in the pumps. Otherwise I think will be in a vicious circle. We are using silver alginate to the major area on his left posterior calf 01/02/17; the patient's posterior left leg has further of all into 3 open wounds. All of them covered with a necrotic surface. He claims to be using his compression pumps once a day. His edema control is marginal. Continue with silver alginate 01/10/17; the patient's left posterior leg actually looks somewhat better. There is less edema, less erythema. Still has 3 open areas covered with a necrotic surface requiring debridement. He claims to be using his compression pumps once a day his edema control is better 01/17/17; the patient's left posterior calf look better last week when I saw him and his wrap was changed 2 days ago. He has noted increasing pain in the left heel and arrives today with much larger wounds extensive erythema extending down into the entire heel area especially tender medially. He is not  systemically unwell CBGs have been controlled no fever. Our intake nurse showed me limegreen drainage on his AVD pads. 01/24/17; his usual this patient responds nicely to antibiotics last week giving him Levaquin for presumed Pseudomonas. The whole entire posterior part of his leg is much better much less inflamed and in the case of his Achilles heel area much less tender. He has also had some epithelialization posteriorly there are still open areas here and still draining but overall considerably better 01/31/17- He has continue to tolerate the compression wraps. he states that he continues to use the lymphedema pumps daily, and can increase to twice daily on the weekends. He is voicing no complaints or concerns regarding his LLE ulcers 02/07/17-he is here for follow-up evaluation. He states that he noted some erythema to the left medial and anterior thigh, which he states is new as of yesterday. He is concerned about recurrent cellulitis. He states his blood sugars have been slightly elevated, this morning in the 180s 02/14/17; he is here for follow-up evaluation. When he was last here there was erythema superiorly from his posterior wound in his anterior thigh. He was prescribed Levaquin however a culture of the wound surface  grew MRSA over the phone I changed him to doxycycline on Monday and things seem to be a lot better. 02/24/17; patient missed his appointment on Friday therefore we changed his nurse visit into a physician visit today. Still using silver alginate on the large area of the posterior left thigh. He isn't new area on the dorsal left second toe 03/03/17; actually better today although he admits he has not used his external compression pumps in the last 2 days or so because of work responsibilities over the weekend. 03/10/17; continued improvement. External compression pumps once a day almost all of his wounds have closed on the posterior left calf. Better edema control 03/17/17; in general  improved. He still has 3 small open areas on the lateral aspect of his left leg however most of the area on the posterior part of his leg is epithelialized. He has better edema control. He has an ABD pad under his stocking on the right anterior lower leg although he did not let us look at that today. 03/24/17; patient arrives back in clinic today with no open areas however there are areas on the posterior left calf and anterior left calf that are less than 100% epithelialized. His edema is well controlled in the left lower leg. There is some pitting edema probably lymphedema in the left upper thigh. He uses compression pumps at home once per day. I tried to get him to do this twice a day although he is very reticent. 04/01/2017 -- for the last 2 days he's had significant redness, tenderness and weeping and came in for an urgent visit today. 04/07/17; patient still has 6 more days of doxycycline. He was seen by Dr. Meyer Russel last Wednesday for cellulitis involving the posterior aspect, lateral aspect of his Involving his heel. For the most part he is better there is less erythema and less weeping. He has been on his feet for 12 hours 2 over the weekend. Using his compression pumps once a day 04/14/17 arrives today with continued improvement. Only one area on the posterior left calf that is not fully epithelialized. He has intense bilateral venous inflammation associated with his chronic venous insufficiency disease and secondary lymphedema. We have been using silver alginate to the left posterior calf wound In passing he tells [place] today that the right leg but we have not seen in quite some time has an open area on it but he doesn't want [place] to look at this today states he will show this to [place] next week. 04/21/17; there is no open area on his left leg although he still reports some weeping edema. He showed [place] his right leg today which is the first time we've seen this leg in a long time. He has a large area of  open wound on the right leg anteriorly healthy granulation. Quite a bit of swelling in the right leg and some degree of venous inflammation. He told us about the right leg in passing last week but states that deterioration in the right leg really only happened over the weekend 04/28/17; there is no open area on the left leg although there is an irritated part on the posterior which is like a wrap injury. The wound on the right leg which was new from last week at least to [place] is a lot better. 05/05/17; still no open area on the left leg. Patient is using his new compression stocking which seems to be doing a good job of controlling the edema. He states he  is using his compression pumps once per day. The right leg still has an open wound although it is better in terms of surface area. Required debridement. A lot of pain in the posterior right Achilles marked tenderness. Usually this type of presentation this patient gives concern for an active cellulitis 05/12/17; patient arrives today with his major wound from last week on the right lateral leg somewhat better. Still requiring debridement. He was using his compression stocking on the left leg however that is reopened with superficial wounds anteriorly he did not have an open wound on this leg previously. He is still using his juxta light's once daily at night. He cannot find the time to do this in the morning as he has to be at work by 7 AM 05/19/17; right lateral leg wound looks improved. No debridement required. The concerning area is on the left posterior leg which appears to almost have a subcutaneous hemorrhagic component to it. We've been using silver alginate to all the wounds 05/26/17; the right lateral leg wound continues to look improved. However the area on the left posterior calf is a tightly adherent surface. [place] using silver alginate. Because of the weeping edema in his legs there is very little good alternatives. 06/02/17; the patient left  here last week looking quite good. Major wound on the left posterior calf and a small one on the right lateral calf. Both of these look satisfactory. He tells me that by Wednesday he had noted increased pain in the left leg and drainage. He called on Thursday and Friday to get an appointment here but we were blocked. He did not go to urgent care or his primary physician. He thinks he had a fever on Thursday but did not actually take his temperature. He has not been using his compression pumps on the left leg because of pain. I advised him to go to the emergency room today for IV antibiotics for stents of left leg cellulitis but he has refused I have asked him to take 2 days off work to keep his leg elevated and he has refused this as well. In view of this I'm going to call him and Augmentin and doxycycline. 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 However after a detailed discussion regarding the risk benefits and alternatives he refuses to get admitted to the hospital. With no other recourse I will continue him on oral antibiotics as before and hopefully he'll have his infectious disease consultation this week. 06/16/2017 -- the patient was seen today by the nurse practitioner at infectious disease Ms. Dixon. 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 He has also been advise Lotrimin cream locally. she also had wise IV antibiotics for his condition if it flares up 06/23/17; patient arrives today with drainage bilaterally although the remaining wound on the left posterior calf after  cleaning up today "highlighter yellow drainage" did not look too bad. Unfortunately he has had breakdown on the right anterior leg [previously this leg had not been open and he is using a black stocking] he went  to see infectious disease and is been put on clindamycin 150 mg daily, I did not verify the dose although I'm not familiar with using clindamycin in this dosing range, perhaps for prophylaxisoo 06/27/17; I brought this patient back today to follow-up on the wound deterioration on the right lower leg together with surrounding cellulitis. I started him on doxycycline 4 days ago. This area looks better however he comes in today with intense cellulitis on the medial part of his left thigh. This is not have a wound in this area. Extremely tender. We've been using silver alginate to the wounds on the right lower leg left lower leg with bilateral 4 layer compression he is using his external compression pumps once a day 07/04/17; patient's left medial thigh cellulitis looks better. He has not been using his compression pumps as his insert said it was contraindicated with cellulitis. His right leg continues to make improvements all the wounds are still open. We only have one remaining wound on the left posterior calf. Using silver alginate to all open areas. He is on doxycycline which I started a week ago and should be finishing I gave him Augmentin after Thursday's visit for the severe cellulitis on the left medial thigh which fortunately looks better 07/14/17; the patient's left medial thigh cellulitis has resolved. The cellulitis in his right lower calf on the right also looks better. All of his wounds are stable to improved we've been using silver alginate he has completed the antibiotics I have given him. He has clindamycin 150 mg once a day prescribed by infectious disease for prophylaxis, I've advised him to start this now. We have been using bilateral Unna boots over silver alginate to the wound  areas 07/21/17; the patient is been to see infectious disease who noted his recurrent problems with cellulitis. He was not able to tolerate prophylactic clindamycin therefore he is on amoxicillin 500 twice a day. He also had a second daily dose of Lasix added By Dr. Oneta Rack but he is not taking this. Nor is he being completely compliant with his compression pumps a especially not this week. He has 2 remaining wounds one on the right posterior lateral lower leg and one on the left posterior medial lower leg. 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 The patient has Unna boots bilaterally. 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 He has bilateral Unna boots for compression and silver alginate to his wounds. Arrives today with his legs looking as good as I have seen him in quite some time. Not surprisingly his wounds look better as well with improvement on the right lateral leg venous insufficiency wound and also the left medial leg. He is still using the compression pumps once a day 08/11/17; both legs appear to be doing better wounds on the right lateral and left medial legs look better. Skin on the right leg quite good. He is been using silver alginate as the primary dressing. I'm going to use Anasept gel calcium alginate and maintain all the secondary dressings 08/18/17; the patient continues to actually do quite well. The area on his right lateral leg is just about closed the left medial also looks better although it is still moist in this area. His edema is well controlled we have been using Anasept gel  with calcium alginate and the usual secondary dressings, 4 layer compression and once daily use of his compression pumps "always been able to manage 09/01/17; the patient  continues to do reasonably well in spite of his trip to [place]. The area on the right lateral leg is epithelialized. Left is much better but still open. He has more edema and more chronic erythema on the left leg [venous inflammation] 09/08/17; he arrives today with no open wound on the right lateral leg and decently controlled edema. Unfortunately his left leg is not nearly as in his good situation as last week.he apparently had increasing edema starting on Saturday. He edema soaked through into his foot so used a plastic bag to walk around his home. The area on the medial right leg which was his open area is about the same however he has lost surface epithelium on the left lateral which is new and he has significant pain in the Achilles area of the left foot. He is already on amoxicillin chronically for prophylaxis of cellulitis in the left leg 09/15/17; he is completed a week of doxycycline and the cellulitis in the left posterior leg and Achilles area is as usual improved. He still has a lot of edema and fluid soaking through his dressings. There is no open wound on the right leg. He saw infectious disease NP today 09/22/17;As usual 1 we transition him from our compression wraps to his stockings things did not go well. He has several small open areas on the right leg. He states this was caused by the compression wrap on his skin although he did not wear this with the stockings over them. He has several superficial areas on the left leg medially laterally posteriorly. He does not have any evidence of active cellulitis especially involving the left Achilles The patient is traveling from [facility] Saturday going to [facility]. He states he isn't attempting to get an appointment with a heel objects wound center there to change his dressings. I am not completely certain whether this will work 10/06/17; the patient came in on Friday for a nurse visit and the nurse reported that his legs actually look  quite good. He arrives in clinic today for his regular follow-up visit. He has a new wound on his left third toe over the PIP probably caused by friction with his footwear. He has small areas on the left leg and a very superficial but epithelialized area on the right anterior lateral lower leg. Other than that his legs look as good as I've seen him in quite some time. We have been using silver alginate Review of systems; no chest pain no shortness of breath other than this a 10 point review of systems negative 10/20/17; seen by Dr. Meyer Russel last week. He had taken some antibiotics [doxycycline] that he had left over. Dr. Meyer Russel thought he had candida infection and declined to give him further antibiotics. He has a small wound remaining on the right lateral leg several areas on the left leg including a larger area on the left posterior several left medial and anterior and a small wound on the left lateral. The area on the left dorsal third toe looks a lot better. ROS; Gen.; no fever, respiratory no cough no sputum Cardiac no chest pain other than this 10 point review of system is negative 10/30/17; patient arrives today having fallen in the bathtub 3 days ago. It took him a while to get up. He has pain and maceration in the  wounds on his left leg which have deteriorated. He has not been using his pumps he also has some maceration on the right lateral leg. 11/03/17; patient continues to have weeping edema especially in the left leg. This saturates his dressings which were just put on on 12/27. As usual the doxycycline seems to take care of the cellulitis on his lower leg. He is not complaining of fever, chills, or other systemic symptoms. He states his leg feels a lot better on the doxycycline I gave him empirically. He also apparently gets injections at his primary doctor's officeo Rocephin for cellulitis prophylaxis. I didn't ask him about his compression pump compliance today I think that's probably  marginal. Arrives in the clinic with all of his dressings primary and secondary macerated full of fluid and he has bilateral edema 11/10/17; the patient's right leg looks some better although there is still a cluster of wounds on the right lateral. The left leg is inflamed with almost circumferential skin loss medially to laterally although we are still maintaining anteriorly. He does not have overt cellulitis there is a lot of drainage. He is not using compression pumps. We have been using silver alginate to the wound areas, there are not a lot of options here 11/17/17; the patient's right leg continues to be stable although there is still open wounds, better than last week. The inflammation in the left leg is better. Still loss of surface layer epithelium especially posteriorly. There is no overt cellulitis in the amount of edema and his left leg is really quite good, tells me he is using his compression pumps once a day. 11/24/17; patient's right leg has a small superficial wound laterally this continues to improve. The inflammation in the left leg is still improving however we have continuous surface layer epithelial loss posteriorly. There is no overt cellulitis in the amount of edema in both legs is really quite good. He states he is using his compression pumps on the left leg once a day for 5 out of 7 days 12/01/17; very small superficial areas on the right lateral leg continue to improve. Edema control in both legs is better today. He has continued loss of surface epithelialization and left posterior calf although I think this is better. We have been using silver alginate with large number of absorptive secondary dressings 4 layer on the left Unna boot on the right at his request. He tells me he is using his compression pumps once a day 12/08/17; he has no open area on the right leg is edema control is good here. On the left leg however he has marked erythema and tenderness breakdown of skin. He has  what appears to be a wrap injury just distal to the popliteal fossa. 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 There was swelling erythema and breakdown in the right leg. He has much worse skin breakdown in the left leg as well multiple open areas medially and posteriorly as well as laterally. He tells me he has been using his compression pumps but tells me he feels that the drainage out of his leg is worse when he uses a compression pumps. T be fair to him he is been saying this o for a  while however I don't know that I have really been listening to this. I wonder if the compression pumps are working properly 12/22/17;. Once again he arrives with severe erythema, weeping edema from the left greater than right leg. Noncompliance with compression pumps. New this visit he is complaining of pain on the lateral aspect of the right leg and the medial aspect of his right thigh. He apparently saw his cardiologist Dr. [name] who was ordered an echocardiogram area and I think this is a step in the right direction 12/25/17; started his doxycycline Monday night. There is still intense erythema of the right leg especially in the anterior thigh although there is less tenderness. The erythema around the wound on the right lateral calf also is less tender. He still complaining of pain in the left heel. His wounds are about the same right lateral left medial left lateral. Superficial but certainly not close to closure. He denies being systemically unwell no fever chills no abdominal pain no diarrhea 12/29/17; back in follow-up of his extensive right calf and right thigh cellulitis. I added amoxicillin to cover possible doxycycline resistant  strep. This seems to of done the trick he is in much less pain there is much less erythema and swelling. He has his echocardiogram at 11:00 this morning. X-ray of the left heel was also negative. 01/05/18; the patient arrived with his edema under much better control. Now that he is retired he is able to use his compression pumps daily and sometimes twice a day per the patient. He has a wound on the right leg the lateral wound looks better. Area on the left leg also looks a lot better. He has no evidence of cellulitis in his bilateral thighs I had a quick peak at his echocardiogram. He is in normal ejection fraction and normal left ventricular function. He has moderate pulmonary hypertension moderately reduced right ventricular function. One would have to wonder about chronic sleep apnea although he says he doesn't snore. He'll review the echocardiogram with his cardiologist. 01/12/18; the patient arrives with the edema in both legs under exemplary control. He is using his compression pumps daily and sometimes twice daily. His wound on the right lateral leg is just about closed. He still has some weeping areas on the posterior left calf and lateral left calf although everything is just about closed here as well. I have spoken with Aldean Baker who is the patient's nurse practitioner and infectious disease. She was concerned that the patient had not understood that the parenteral penicillin injections he was receiving for cellulitis prophylaxis was actually benefiting him. I don't think the patient actually saw that I would tend to agree we were certainly dealing with less infections although he had a serious one last month. 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 He is healed. He'll be placed in juxtalite compression wraps and increase his lymphedema pumps to twice daily. We will follow up again next week to ensure there are no issues with the new  regiment. 01/20/18-he is here for evaluation of bilateral lower extremity weeping edema. Yesterday he was placed in compression wrap to the right lower extremity and compression stocking to left lower shrubbery. He states he uses lymphedema pumps last night and again this morning and noted a blister to the left lower extremity. On exam he was noted to have drainage to the right lower extremity. He will be placed in Unna boots bilaterally and follow-up next week 01/26/18; patient was  actually discharged a week ago to his own juxta light stockings only to return the next day with bilateral lower extremity weeping edema.he was placed in bilateral Unna boots. He arrives today with pain in the back of his left leg. There is no open area on the right leg however there is a linear/wrap injury on the left leg and weeping edema on the left leg posteriorly. I spoke with infectious disease about 10 days ago. They were disappointed that the patient elected to discontinue prophylactic intramuscular penicillin shots as they felt it was particularly beneficial in reducing the frequency of his cellulitis. I discussed this with the patient today. He does not share this view. He'll definitely need antibiotics today. Finally he is traveling to [place] and trauma leaving this Saturday and returning a week later and he does not travel with his pumps. He is going by car 01/30/18; patient was seen 4 days ago and brought back in today for review of cellulitis in the left leg posteriorly. I put him on amoxicillin this really hasn't helped as much as I might like. He is also worried because he is traveling to [facility] trauma by car. Finally we will be rewrapping him. There is no open area on the right leg over his left leg has multiple weeping areas as usual 02/09/18; The same wrap on for 10 days. He did not pick up the last doxycycline I prescribed for him. He apparently took 4 days worth he already had. There is nothing open on  his right leg and the edema control is really quite good. He's had damage in the left leg medially and laterally especially probably related to the prolonged use of Unna boots 02/12/18; the patient arrived in clinic today for a nurse visit/wrap change. He complained of a lot of pain in the left posterior calf. He is taking doxycycline that I previously prescribed for him. Unfortunately even though he used his stockings and apparently used to compression pumps twice a day he has weeping edema coming out of the lateral part of his right leg. This is coming from the lower anterior lateral skin area. 02/16/18; the patient has finished his doxycycline and will finish the amoxicillin 2 days. The area of cellulitis in the left calf posteriorly has resolved. He is no longer having any pain. He tells me he is using his compression pumps at least once a day sometimes twice. 02/23/18; the patient finished his doxycycline and Amoxil last week. On Friday he noticed a small erythematous circle about the size of a quarter on the left lower leg just above his ankle. This rapidly expanded and he now has erythema on the lateral and posterior part of the thigh. This is bright red. Also has an area on the dorsal foot just above his toes and a tender area just below the left popliteal fossa. He came off his prophylactic penicillin injections at his own insistence one or 2 months ago. This is obviously deteriorated since then 03/02/18; patient is on doxycycline and Amoxil. Culture I did last week of the weeping area on the back of his left calf grew group B strep. I have therefore renewed the amoxicillin 500 3 times a day for a further week. He has not been systemically unwell. Still complaining of an area of discomfort right under his left popliteal fossa. There is no open wound on the right leg. He tells me that he is using his pumps twice a day on most days 03/09/18; patient arrives in  clinic today completing his amoxicillin  today. The cellulitis on his left leg is better. Furthermore he tells me that he had intramuscular penicillin shots that his primary care office today. However he also states that the wrap on his right leg fell down shortly after leaving clinic last week. He developed a large blister that was present when he came in for a nurse visit later in the week and then he developed intense discomfort around this area.He tells me he is using his compression pumps 03/16/18; the patient has completed his doxycycline. The infectious part of this/cellulitis in the left heel area left popliteal area is a lot better. He has 2 open areas on the right calf. Still areas on the left calf but this is a lot better as well. 03/24/18; the patient arrives complaining of pain in the left popliteal area again. He thinks some of this is wrap injury. He has no open area on the right leg and really no open area on the left calf either except for the popliteal area. He claims to be compliant with the compression pumps 03/31/18; I gave him doxycycline last week because of cellulitis in the left popliteal area. This is a lot better although the surface epithelium is denuded off and response to this. He arrives today with uncontrolled edema in the right calf area as well as a fingernail injury in the right lateral calf. There is only a few open areas on the left 04/06/18; I gave him amoxicillin doxycycline over the last 2 weeks that the amoxicillin should be completing currently. He is not complaining of any pain or systemic symptoms. The only open areas see has is on the right lateral lower leg paradoxically I cannot see anything on the left lower leg. He tells me he is using his compression pumps twice a day on most days. Silver alginate to the wounds that are open under 4 layer compression 04/13/18; he completed antibiotics and has no new complaints. Using his compression pumps. Silver alginate that anything that's opened 04/20/18; he is  using his compression pumps religiously. Silver alginate 4 layer compression anything that's opened. He comes in today with no open wounds on the left leg but 3 on the right including a new one posteriorly. He has 2 on the right lateral and one on the right posterior. He likes Unna boots on the right leg for reasons that aren't really clear we had the usual 4 layer compression on the left. It may be necessary to move to the 4 layer compression on the right however for now I left them in the Unna boots 04/27/18; he is using his compression pumps at least once a day. He has still the wounds on the right lateral calf. The area right posteriorly has closed. He does not have an open wound on the left under 4 layer compression however on the dorsal left foot just proximal to the toes and the left third toe 2 small open areas were identified 05/11/18; he has not uses compression pumps. The areas on the right lateral calf have coalesced into one large wound necrotic surface. On the left side he has one small wound anteriorly however the edema is now weeping out of a large part of his left leg. He says he wasn't using his pumps because of the weeping fluid. I explained to him that this is the time he needs to pump more 05/18/18; patient states he is using his compression pumps twice a day. The area on  the right lateral large wound albeit superficial. On the left side he has innumerable number of small new wounds on the left calf particularly laterally but several anteriorly and medially. All these appear to have healthy granulated base these look like the remnants of blisters however they occurred under compression. The patient arrives in clinic today with his legs somewhat better. There is certainly less edema, less multiple open areas on the left calf and the right anterior leg looks somewhat better as well superficial and a little smaller. However he relates pain and erythema over the last 3-4 days in the thigh  and I looked at this today. He has not been systemically unwell no fever no chills no change in blood sugar values 05/25/18; comes in today in a better state. The severe cellulitis on his left leg seems better with the Keflex. Not as tender. He has not been systemically unwell Hard to find an open wound on the left lower leg using his compression pumps twice a day The confluent wounds on his right lateral calf somewhat better looking. These will ultimately need debridement I didn't do this today. 06/01/18; the severe cellulitis on the left anterior thigh has resolved and he is completed his Keflex. There is no open wound on the left leg however there is a superficial excoriation at the base of the third toe dorsally. Skin on the bottom of his left foot is macerated looking. The left the wounds on the lateral right leg actually looks some better although he did require debridement of the top half of this wound area with an open curet 06/09/18 on evaluation today patient appears to be doing poorly in regard to his right lower extremity in particular this appears to likely be infected he has very thick purulent discharge along with a bright green tent to the discharge. This makes me concerned about the possibility of pseudomonas. He's also having increased discomfort at this point on evaluation. Fortunately there does not appear to be any evidence of infection spreading to the other location at this time. 06/16/18 on evaluation today patient appears to actually be doing fairly well. His ulcer has actually diminished in size quite significantly at this point which is good news. Nonetheless he still does have some evidence of infection he did see infectious disease this morning before coming here for his appointment. I did review the results of their evaluation and their note today. They did actually have him discontinue the Cipro and initiate treatment with linezolid at this time. He is doing this for the  next seven days and they recommended a follow-up in four months with them. He is the keep a log of the need for intermittent antibiotic therapy between now and when he falls back up with infectious disease. This will help them gaze what exactly they need to do to try and help them out. 06/23/18; the patient arrives today with no open wounds on the left leg and left third toe healed. He is been using his compression pumps twice a day. On the right lateral leg he still has a sizable wound but this is a lot better than last time I saw this. 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 Has been using silver alginate under 4 layer compression 06/30/18; the only open wound he has is on the right lateral leg and this looks healthy. No debridement is required. We have  been using silver alginate. He does not have an open wound on the left leg. There is apparently some drainage from the dorsal proximal third toe on the left although I see no open wound here. 07/03/18 on evaluation today patient was actually here just for a nurse visit rapid change. However when he was here on Wednesday for his rat change due to having been healed on the left and then developing blisters we initiated the wrap again knowing that he would be back today for [place] to reevaluate and see were at. Unfortunately he has developed some cellulitis into the proximal portion of his right lower extremity even into the region of his thigh. 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 He was placed on one as what at that point. Nonetheless he is done with that and has been tolerating it well otherwise. Doxycycline which in the past really did not seem to be effective for him. Nonetheless I think the best option may be for [place] to definitely reinitiate the antibiotics for a longer period of time. 07/07/18; since I  last saw this patient a week ago he has had a difficult time. At that point he did not have an open wound on his left leg. We transitioned him into juxta light stockings. He was apparently in the clinic the next day with blisters on the left lateral and left medial lower calf. He also had weeping edema fluid. He was put back into a compression wrap. He was also in the clinic on Friday with intense erythema in his right thigh. Per the patient he was started on Bactrim however that didn't work at all in terms of relieving his pain and swelling. He has taken 3 doxycycline that he had left over from last time and that seems to of helped. He has blistering on the right thigh as well. 07/14/18; the erythema on his right thigh has gotten better with doxycycline that he is finishing. 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 Presumably this cellulitis in the thigh was not related to that although I think this is a bit concerning going forward. He still has an area on the right lateral calf the blister on the right medial calf just below the knee that was discussed above. On the left 2 small open areas left medial and left lateral. Edema control is adequate. He is using his compression pumps twice a day 07/20/18; continued improvement in the condition of both legs especially the edema in his bilateral thighs. He tells me he is been losing weight through a combination of diet and exercise. He is using his compression pumps twice a day. So overall she made to the remaining wounds 07/27/2018; continued improvement in condition of both legs. His edema is well controlled. The area on the right lateral leg is just about closed he had one blisters show up on the medial left upper calf. We have him in 4 layer compression. He is going on a 10-day trip to [place], [place] and [place]. He will be driving. He wants to wear Unna boots because of  the lessening amount of constriction. He will not use compression pumps while he is away 08/05/18 on evaluation today patient actually appears to be doing decently well all things considered in regard to his bilateral lower extremities. The worst ulcer is actually only posterior aspect of  his left lower extremity with a four layer compression wrap cut into his leg a couple weeks back. He did have a trip and actually had [employer] for the trip that he is worn since he was last here. Nonetheless he feels like the [employer] actually do better for him his swelling is up a little bit but he also with his trip was not taking his Lasix on a regular set schedule like he was supposed to be. He states that obviously the reason being that he cannot drive and keep going without having to urinate too frequently which makes it difficult. He did not have his pumps with him while he was away either which I think also maybe playing a role here too. 08/13/2018; the patient only has a small open wound on the right lateral calf which is a big improvement in the last month or 2. He also has the area posteriorly just below the posterior fossa on the left which I think was a wrap injury from several weeks ago. He has no current evidence of cellulitis. He tells me he is back into his compression pumps twice a day. He also tells me that while he was at the laundromat somebody stole a section of his extremitease stockings 08/20/2018; back in the clinic with a much improved state. He only has small areas on the right lateral mid calf which is just about healed. This was is more substantial area for quite a prolonged period of time. He has a small open area on the left anterior tibia. The area on the posterior calf just below the popliteal fossa is closed today. He is using his compression pumps twice a day 08/28/2018; patient has no open wound on the right leg. He has a smattering of open areas on the calf with some  weeping lymphedema. More problematically than that it looks as though his wraps of slipped down in his usual he has very angry upper area of edema just below the right medial knee and on the right lateral calf. He has no open area on his feet. The patient is traveling to [facility] next week. I will send him in an antibiotic. We will continue to wrap the right leg. We ordered extremitease stockings for him last week and I plan to transition the right leg to a stocking when he gets home which will be in 10 days time. As usual he is very reluctant to take his pumps with him when he travels 09/07/2018; patient returns from [facility]. He shows me a picture of his left leg in the mid part of his trip last week with intense fire engine erythema. The picture look bad enough I would have considered sending him to the hospital. Instead he went to the wound care center in [facility]. They did not prescribe him antibiotics but he did take some doxycycline he had leftover from a previous visit. I had given him trimethoprim sulfamethoxazole before he left this did not work according to the patient. This is resulted in some improvement fortunately. He comes back with a large wound on the left posterior calf. Smaller area on the left anterior tibia. Denuded blisters on the dorsal left foot over his toes. Does not have much in the way of wounds on the right leg although he does have a very tender area on the right posterior area just below the popliteal fossa also suggestive of infection. He promises me he is back on his  pumps twice a day 09/15/2018; the intense cellulitis in his left lower calf is a lot better. The wound area on the posterior left calf is also so better. However he has reasonably extensive wounds on the dorsal aspect of his second and third toes and the proximal foot just at the base of the toes. There is nothing open on the right leg 09/22/2018; the patient has excellent edema  control in his legs bilaterally. He is using his external compression pumps twice a day. He has no open area on the right leg and only the areas in the left foot dorsally second and third toe area on the left side. He does not have any signs of active cellulitis. 10/06/2018; the patient has good edema control bilaterally. He has no open wound on the right leg. There is a blister in the posterior aspect of his left calf that we had to deal with today. He is using his compression pumps twice a day. There is no signs of active cellulitis. We have been using silver alginate to the wound areas. He still has vulnerable areas on the base of his left first second toes dorsally He has a his extremities stockings and we are going to transition him today into the stocking on the right leg. He is cautioned that he will need to continue to use the compression pumps twice a day. If he notices uncontrolled edema in the right leg he may need to go to 3 times a day. 10/13/2018; the patient came in for a nurse check on Friday he has a large flaccid blister on the right medial calf just below the knee. We unroofed this. He has this and a new area underneath the posterior mid calf which was undoubtedly a blister as well. He also has several small areas on the right which is the area we put his extremities stocking on. 10/19/2018; the patient went to see infectious disease this morning I am not sure if that was a routine follow-up in any case the doxycycline I had given him was discontinued and started on linezolid. He has not started this. It is easy to look at his left calf and the inflammation and think this is cellulitis however he is very tender in the tissue just below the popliteal fossa and I have no doubt that there is infection going on here. He states the problem he is having is that with the compression pumps the edema goes down and then starts walking the wrap falls down. We will see if we can adhere this. He  has 1 or 2 minuscule open areas on the right still areas that are weeping on the posterior left calf, the base of his left second and third toes 10/26/18; back today in clinic with quite of skin breakdown in his left anterior leg. This may have been infection the area below the popliteal fossa seems a lot better however tremendous epithelial loss on the left anterior mid tibia area over quite inexpensive tissue. He has 2 blisters on the right side but no other open wound here. 10/29/2018; came in urgently to see [place] today and we worked him in for review. He states that the 4 layer compression on the right leg caused pain he had to cut it down to roughly his mid calf this caused swelling above the wrap and he has blisters and skin breakdown today. As a result of the pain he has not been using his pumps. Both legs are a lot more  edematous and there is a lot of weeping fluid. 11/02/18; arrives in clinic with continued difficulties in the right leg> left. Leg is swollen and painful. multiple skin blisters and new open areas especially laterally. He has not been using his pumps on the right leg. 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 He is not systemically unwell. 11/09/2018; the patient claims he is being compliant with his pumps. He is finished the doxycycline I gave him last week. Culture I did of the wound on the right lateral leg showed a few very resistant methicillin staph aureus. This was resistant to doxycycline. Nevertheless he states the pain in the leg is a lot better which makes me wonder if the cultured organism was not really what was causing the problem nevertheless this is a very dangerous organism to be culturing out of any wound. His right leg is still a lot larger than the left. He is using an Radio broadcast assistant on this area, he blames a 4-layer compression for causing the original skin breakdown which I doubt is true however I cannot talk him out  of it. We have been using silver alginate to all of these areas which were initially blisters 11/16/2018; patient is being compliant with his external compression pumps at twice a day. Miraculously he arrives in clinic today with absolutely no open wounds. He has better edema control on the left where he has been using 4 layer compression versus wound of wounds on the right and I pointed this out to him. There is no inflammation in the skin in his lower legs which is also somewhat unusual for him. There is no open wounds on the dorsal left foot. He has extremitease stockings at home and I have asked him to bring these in next week. 11/25/18 patient's lower extremity on examination today on the left appears for the most part to be wound free. He does have an open wound on the lateral aspect of the right lower extremity but this is minimal compared to what I've seen in past. He does request that we go ahead and wrap the left leg as well even though there's nothing open just so hopefully it will not reopen in short order. 1/28; patient has superficial open wounds on the right lateral calf left anterior calf and left posterior calf. His edema control is adequate. 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 We have been using silver alginate as the primary dressing. He claims compliance with his compression pumps 2/4; patient has superficial open wounds on numerous areas of his left calf and again one on the left dorsal foot. The areas on the right lateral calf have healed. The cellulitis that I gave him doxycycline for last week is also resolved this was mostly on the left anterior calf just below the tibial tuberosity. His edema looks fairly well-controlled. He tells me he went to see his primary doctor today and had blood work ordered 2/11; once again he has several open areas on the left calf left tibial area. Most of  these are small and appear to have healthy granulation. He does not have anything open on the right. The edema and control in his thighs is pretty good which is usually a good indication he has been using his pumps as requested. 2/18; he continues to have several small areas on the left calf and left tibial  area. Most of these are small healthy granulation. We put him in his stocking on the right leg last week and he arrives with a superficial open area over the right upper tibia and a fairly large area on the right lateral tibia in similar condition. His edema control actually does not look too bad, he claims to be using his compression pumps twice a day 2/25. Continued small areas on the left calf and left tibial area. New areas especially on the right are identified just below the tibial tuberosity and on the right upper tibia itself. There are also areas of weeping edema fluid even without an obvious wound. He does not have a considerable degree of lymphedema but clearly there is more edema here than his skin can handle. He states he is using the pumps twice a day. We have an Unna boot on the right and 4 layer compression on the left. 3/3; he continues to have an area on the right lateral calf and right posterior calf just below the popliteal fossa. There is a fair amount of tenderness around the wound on the popliteal fossa but I did not see any evidence of cellulitis, could just be that the wrap came down and rubbed in this area. He does not have an open area on the left leg however there is an area on the left dorsal foot at the base of the third toe We have been using silver alginate to all wound areas 3/10; he did not have an open area on his left leg last time he was here a week ago. T oday he arrives with a horizontal wound just below the tibial tuberosity and an area on the left lateral calf. He has intense erythema and tenderness in this area. The area is on the right lateral calf and  right posterior calf better than last week. We have been using silver alginate as usual 3/18 - Patient returns with 3 small open areas on left calf, and 1 small open area on right calf, the skin looks ok with no significant erythema, he continues the UNA boot on right and 4 layer compression on left. The right lateral calf wound is closed , the right posterior is small area. we will continue silver alginate to the areas. 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 He is been tolerating the dressing changes without complication. Fortunately she has made excellent improvement in regard to the overall status of his wounds. Unfortunately every time we cease wrapping him he ends up reopening in causing more significant issues at that point. Again I'm unsure of the best direction to take although I think the lymphedema clinic may be appropriate for him. 02/03/19 on evaluation today patient appears to be doing well in regard to the wounds that we saw him for last week unfortunately he has a new area on the proximal portion of his right medial/posterior lower extremity where the wrap somewhat slowed down and caused swelling and a blister to rub and open. Unfortunately this is the only opening that he has on either leg at this point. 02/17/19 on evaluation today patient's bilateral lower extremities appear to be doing well. He still completely healed in regard to the left lower extremity. In regard to the right lower extremity the area where the wrap and slid down  and caused the blister still seems to be slightly open although this is dramatically better than during the last evaluation two weeks ago. I'm very pleased with the way this stands overall. 03/03/19 on evaluation today patient appears to be doing well in regard  to his right lower extremity in general although he did have a new blister open this does not appear to be showing any evidence of active infection at this time. Fortunately there's No fevers, chills, nausea, or vomiting noted at this time. Overall I feel like he is making good progress it does feel like that the right leg will we perform the [employer] seems to do with a bit better than three layer wrap on the left which slid down on him. We may switch to doing bilateral in the book wraps. 5/4; I have not seen Mr. Goodenow in quite some time. According to our case manager he did not have an open wound on his left leg last week. He had 1 remaining wound on the right posterior medial calf. He arrives today with multiple openings on the left leg probably were blisters and/or wrap injuries from Unna boots. I do not think the Unna boot's will provide adequate compression on the left. I am also not clear about the frequency he is using the compression pumps. 03/17/19 on evaluation today patient appears to be doing excellent in regard to his lower extremities compared to last week's evaluation apparently. He had gotten significantly worse last week which is unfortunate. The [employer] wrap on the left did not seem to do very well for him at all and in fact it didn't control his swelling significantly enough he had an additional outbreak. Subsequently we go back to the four layer compression wrap on the left. This is good news. At least in that he is doing better and the wound seem to be killing him. He still has not heard anything from the lymphedema clinic. 03/24/19 on evaluation today patient actually appears to be doing much better in regard to his bilateral lower Trinity as compared to last week when I saw him. Fortunately there's no signs of active infection at this time. He has been tolerating the dressing changes without complication. Overall I'm extremely pleased with the progress and appearance in  general. 04/07/19 on evaluation today patient appears to be doing well in regard to his bilateral lower extremities. His swelling is significantly down from where it was previous. With that being said he does have a couple blisters still open at this point but fortunately nothing that seems to be too severe and again the majority of the larger openings has healed at this time. 04/14/19 on evaluation today patient actually appears to be doing quite well in regard to his bilateral lower extremities in fact I'm not even sure there's anything significantly open at this time at any site. Nonetheless he did have some trouble with these wraps where they are somewhat irritating him secondary to the fact that he has noted that the graph wasn't too close down to the end of this foot in a little bit short as well up to his knee. Otherwise things seem to be doing quite well. 04/21/19 upon evaluation today patient's wound bed actually showed evidence of being completely healed in regard to both lower extremities which is excellent news. There does not appear to be any signs of active infection which is also good news. I'm very pleased in this regard. No fevers, chills, nausea, or vomiting noted  at this time. 04/28/19 on evaluation today patient appears to be doing a little bit worse in regard to both lower extremities on the left mainly due to the fact that when he went infection disease the wrap was not wrapped quite high enough he developed a blister above this. On the right he is a small open area of nothing too significant but again this is continuing to give him some trouble he has been were in the Velcro compression that he has at home. 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 He's been tolerating the dressing changes without complication. Fortunately there's no signs of active infection at this time. No fevers, chills, nausea, or vomiting noted at  this time. We have been trying to get an appointment with her lymphedema clinic in [facility] but unfortunately nobody can get them on phone with not been able to even fax information over the patient likewise is not been able to get in touch with them. Overall I'm not sure exactly what's going on here with to reach out again today. 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 Still having a lot of drainage unfortunately. He tells me especially in the left but even on the right. There's no signs of active infection which is good news we've been using so ratcheted up to this point. 05/19/19 on evaluation today patient actually appears to be doing quite well with regard to his left lower extremity which is great news. Fortunately in regard to the right lower extremity has an issues with his wrap and he subsequently did remove this from what I'm understanding. Nonetheless long story short is what he had rewrapped once he removed it subsequently had maggots underneath this wrap whenever he came in for evaluation today. With that being said they were obviously completely cleaned away by the nursing staff. The visit today which is excellent news. However he does appear to potentially have some infection around the right ankle region where the maggots were located as well. He will likely require anabiotic therapy today. 05/26/19 on evaluation today patient actually appears to be doing much better in regard to his bilateral lower extremities. I feel like the infection is under much better control. With that being said there were maggots noted when the wrap was removed yet again today. Again this could have potentially been left over from previous although at this time there does not appear to be any signs of significant drainage there was obviously on the wrap some drainage as well this contracted gnats or  otherwise. Either way I do not see anything that appears to be doing worse in my pinion and in fact I think his drainage has slowed down quite significantly likely mainly due to the fact to his infection being under better control. 06/02/2019 on evaluation today patient actually appears to be doing well with regard to his bilateral lower extremities there is no signs of active infection at this time which is great news. With that being said he does have several open areas more so on the right than the left but nonetheless these are all significantly better than previously noted. 06/09/2019 on evaluation today patient actually appears to be doing well. His wrap stayed up and he did not cause any problems he had more drainage on the right compared to the left but overall I do not see  any major issues at this time which is great news. 06/16/2019 on evaluation today patient appears to be doing excellent with regard to his lower extremities the only area that is open is a new blister that can have opened as of today on the medial ankle on the left. Other than this he really seems to be doing great I see no major issues at this point. 06/23/2019 on evaluation today patient appears to be doing quite well with regard to his bilateral lower extremities. In fact he actually appears to be almost completely healed there is a small area of weeping noted of the right lower extremity just above the ankle. Nonetheless fortunately there is no signs of active infection at this time which is good news. No fevers, chills, nausea, vomiting, or diarrhea. 8/24; the patient arrived for a nurse visit today but complained of very significant pain in the left leg and therefore I was asked to look at this. Noted that he did not have an open area on the left leg last week nevertheless this was wrapped. The patient states that he is not been able to put his compression pumps on the left leg because of the discomfort. He has not been  systemically unwell 06/30/2019 on evaluation today patient unfortunately despite being excellent last week is doing much worse with regard to his left lower extremity today. In fact he had to come in for a nurse on Monday where his left leg had to be rewrapped due to excessive weeping Dr. Leanord Hawking placed him on doxycycline at that point. Fortunately there is no signs of active infection Systemically at this time which is good news. 07/07/2019 in regard to the patient's wounds today he actually seems to be doing well with his right lower extremity there really is nothing open or draining at this point this is great news. Unfortunately the left lower extremity is given him additional trouble at this time. There does not appear to be any signs of active infection nonetheless he does have a lot of edema and swelling noted at this point as well as blistering all of which has led to a much more poor appearing leg at this time compared to where it was 2 weeks ago when it was almost completely healed. Obviously this is a little discouraging for the patient. He is try to contact the lymphedema clinic in [place] he has not been able to get through to them. 07/14/2019 on evaluation today patient actually appears to be doing slightly better with regard to his left lower extremity ulcers. Overall I do feel like at least at the top of the wrap that we have been placing this area has healed quite nicely and looks much better. The remainder of the leg is showing signs of improvement. Unfortunately in the thigh area he still has an open region on the left and again on the right he has been utilizing just a Band-Aid on an area that also opened on the thigh. Again this is an area that were not able to wrap although we did do an Ace wrap to provide some compression that something that obviously is a little less effective than the compression wraps we have been using on the lower portion of the leg. He does have an  appointment with the lymphedema clinic in [facility] on Friday. 07/21/2019 on evaluation today patient appears to be doing better with regard to his lower extremity ulcers. He has been tolerating the dressing changes without complication. Fortunately there is  no signs of active infection at this time. No fevers, chills, nausea, vomiting, or diarrhea. I did receive the paperwork from the physical therapist at the lymphedema clinic in [place]. Subsequently I signed off on that this morning and sent that back to him for further progression with the treatment plan. 07/28/2019 on evaluation today patient appears to be doing very well with regard to his right lower extremity where I do not see any open wounds at this point. Fortunately he is feeling great as far as that is concerned as well. In regard to the left lower extremity he has been having issues with still several areas of weeping and edema although the upper leg is doing better his lower leg still I think is going require the compression wrap at this time. No fevers, chills, nausea, vomiting, or diarrhea. 08/04/2019 on evaluation today patient unfortunately is having new wounds on the right lower extremity. Again we have been using Unna boot wrap on that side. We switched him to using his juxta lite wrap at home. With that being said he tells me he has been using it although his legs extremely swollen and to be honest really does not appear that he has been. I cannot know that for sure however. Nonetheless he has multiple new wounds on the right lower extremity at this time. Obviously we will have to see about getting this rewrapped for him today. 08/11/2019 on evaluation today patient appears to be doing fairly well with regard to his wounds. He has been tolerating the dressing changes including the compression wraps without complication. He still has a lot of edema in his upper thigh regions bilaterally he is supposed to be seeing the  lymphedema clinic on the 15th of this month once his wraps arrive for the upper part of his legs. 08/18/2019 on evaluation today patient appears to be doing well with regard to his bilateral lower extremities at this point. He has been tolerating the dressing changes without complication. Fortunately there is no signs of active infection which is also good news. He does have a couple weeping areas on the first and second toe of the right foot he also has just a small area on the left foot upper leg and a small area on the left lower leg but overall he is doing quite well in my opinion. He is supposed to be getting his wraps shortly in fact tomorrow and then subsequently is seeing the lymphedema clinic next Wednesday on the 21st. Of note he is also leaving on the 25th to go on vacation for a week to the beach. For that reason and since there is some uncertainty about what there can be doing at lymphedema clinic next Wednesday I am get a make an appointment for next Friday here for [place] to see what we need to do for him prior to him leaving for vacation. 10/23; patient arrives in considerable pain predominantly in the upper posterior calf just distal to the popliteal fossa also in the wound anteriorly above the major wound. This is probably cellulitis and he has had this recurrently in the past. He has no open wound on the right side and he has had an Radio broadcast assistant in that area. Finally I note that he has an area on the left posterior calf which by enlarge is mostly epithelialized. This protrudes beyond the borders of the surrounding skin in the setting of dry scaly skin and lymphedema. The patient is leaving for [place] on Sunday. Per  his longstanding pattern, he will not take his compression pumps with him predominantly out of fear that they will be stolen. He therefore asked that we put a Unna boot back on the right leg. He will also contact the wound care center in [facility] to see if they can  change his dressing in the mid week. 11/3; patient returned from his vacation to [facility]. He was seen on 1 occasion at their wound care center. They did a 2 layer compression system as they did not have our 4-layer wrap. I am not completely certain what they put on the wounds. They did not change the Unna boot on the right. The patient is also seeing a lymphedema specialist physical therapist in [place]. It appears that he has some compression sleeve for his thighs which indeed look quite a bit better than I am used to seeing. He pumps over these with his external compression pumps. 11/10; the patient has a new wound on the right medial thigh otherwise there is no open areas on the right. He has an area on the left leg posteriorly anteriorly and medially and an area over the left second toe. We have been using silver alginate. He thinks the injury on his thigh is secondary to friction from the compression sleeve he has. 11/17; the patient has a new wound on the right medial thigh last week. He thinks this is because he did not have a underlying stocking for his thigh juxta lite apparatus. He now has this. The area is fairly large and somewhat angry but I do not think he has underlying cellulitis. He has a intact blister on the right anterior tibial area. Small wound on the right great toe dorsally Small area on the medial left calf. 11/30; the patient does not have any open areas on his right leg and we did not take his juxta lite stocking off. However he states that on Friday his compression wrap fell down lodging around his upper mid calf area. As usual this creates a lot of problems for him. He called urgently today to be seen for a nurse visit however the nurse visit turned into a provider visit because of extreme erythema and pain in the left anterior tibia extending laterally and posteriorly. The area that is problematic is extensive 10/06/2019 upon evaluation today patient actually  appears to be doing poorly in regard to his left lower extremity. He Dr. Leanord Hawking did place him on doxycycline this past Monday apparently due to the fact that he was doing much worse in regard to this left leg. Fortunately the doxycycline does seem to be helping. Unfortunately we are still having a very difficult time getting his edema under any type of control in order to anticipate discharge at some point. The only way were really able to control his lymphedema really is with compression wraps and that has only even seemingly temporary. He has been seeing a lymphedema clinic they are trying to help in this regard but still this has been somewhat frustrating in general for the patient. 10/13/19 on evaluation today patient appears to be doing excellent with regard to his right lower extremity as far as the wounds are concerned. His swelling is still quite extensive unfortunately. He is still having a lot of drainage from the thigh areas bilaterally which is unfortunate. He's been going to lymphedema clinic but again he still really does not have this edema under control as far as his lower extremities are concern. With regard  to his left lower extremity this seems to be improving and I do believe the doxycycline has been of benefit for him. He is about to complete the doxycycline. 10/20/2019 on evaluation today patient appears to be doing poorly in regard to his bilateral lower extremities. More in the right thigh he has a lot of irritation at this site unfortunately. In regard to the left lower extremity the wrap was not quite as high it appears and does seem to have caused him some trouble as well. Fortunately there is no evidence of systemic infection though he does have some blue-green drainage which has me concerned for the possibility of Pseudomonas. He tells me he is previously taking Cipro without complications and he really does not care for Levaquin however due to some of the side effects he  has. He is not allergic to any medications specifically antibiotics that were aware of. 10/27/2019 on evaluation today patient actually does appear to be for the most part doing better when compared to last week's evaluation. With that being said he still has multiple open wounds over the bilateral lower extremities. He actually forgot to start taking the Cipro and states that he still has the whole bottle. He does have several new blisters on left lower extremity today I think I would recommend he go ahead and take the Cipro based on what I am seeing at this point. 12/30-Patient comes at 1 week visit, 4 layer compression wraps on the left and Unna boot on the right, primary dressing Xtrasorb and silver alginate. Patient is taking his Cipro and has a few more days left probably 5-6, and the legs are doing better. He states he is using his compressions devices which I believe he has 11/10/2019 on evaluation today patient actually appears to be much better than last time I saw him 2 weeks ago. His wounds are significantly improved and overall I am very pleased in this regard. Fortunately there is no signs of active infection at this time. He is just a couple of days away from completing Cipro. Overall his edema is much better he has been using his lymphedema pumps which I think is also helping at this point. 11/17/2019 on evaluation today patient appears to be doing excellent in regard to his wounds in general. His legs are swollen but not nearly as much as they have been in the past. Fortunately he is tolerating the compression wraps without complication. No fevers, chills, nausea, vomiting, or diarrhea. He does have some erythema however in the distal portion of his right lower extremity specifically around the forefoot and toes there is a little bit of warmth here as well. 11/24/2019 on evaluation today patient appears to be doing well with regard to his right lower extremity I really do not see any open  wounds at this point. His left lower extremity does have several open areas and his right medial thigh also is open. Other than this however overall the patient seems to be making good progress and I am very pleased at this point. 12/01/2019 on evaluation today patient appears to be doing poorly at this point in regard to his left lower extremity has several new blisters despite the fact that we have him in compression wraps. In fact he had a 4-layer compression wrap, his upper thigh wrapped from lymphedema clinic, and a juxta light over top of the 4 layer compression wrap the lymphedema clinic applied and despite all this he still develop blisters underneath. Obviously this does  have me concerned about the fact that unfortunately despite what we are doing to try to get wounds healed he continues to have new areas arise I do not think he is ever good to be at the point where he can realistically just use wraps at home to keep things under control. Typically when we heal him it takes about 1-2 days before he is back in the clinic with severe breakdown and blistering of his lower extremities bilaterally. This is happened numerous times in the past. Unfortunately I think that we may need some help as far as overall fluid overload to kind of limit what we are seeing and get things under better control. 12/08/2019 on evaluation today patient presents for follow-up concerning his ongoing bilateral lower extremity edema. Unfortunately he is still having quite a bit of swelling the compression wraps are controlling this to some degree but he did see Dr. Debara Pickett his cardiologist I do have that available for review today as far as the appointment was concerned that was on 12/06/2019. Obviously that she has been 2 days ago. The patient states that he is only been taking the Lasix 80 mg 1 time a day he had told me previously he was taking this twice a day. Nonetheless Dr. Debara Pickett recommended this be up to 80 mg 2 times a  day for the patient as he did appear to be fluid overloaded. With that being said the patient states he did this yesterday and he was unable to go anywhere or do anything due to the fact that he was constantly having to urinate. Nonetheless I think that this is still good to be something that is important for him as far as trying to get his edema under control at all things that he is going to be able to just expect his wounds to get under control and things to be better without going through at least a period of time where he is trying to stabilize his fluid management in general and I think increasing the Lasix is likely the first step here. It was also mentioned the possibility that the patient may require metolazone. With that being said he wanted to have the patient take Lasix twice a day first and then reevaluating 2 months to see where things stand. 12/15/2019 upon evaluation today patient appears to be doing regard to his legs although his toes are showing some signs of weeping especially on the left at this point to some degree on the right. There does not appear to be any signs of active infection and overall I do feel like the compression wraps are doing well for him but he has not been able to take the Lasix at home and the increased dose that Dr. Debara Pickett recommended. He tells me that just not go to be feasible for him. Nonetheless I think in this case he should probably send a message to Dr. Debara Pickett in order to discuss options from the standpoint of possible admission to get the fluid off or otherwise going forward. 12/22/2019 upon evaluation today patient appears to be doing fairly well with regard to his lower extremities at this point. In fact he would be doing excellent if it was not for the fact that his right anterior thigh apparently had an allergic reaction to adhesive tape that he used. The wound itself that we have been monitoring actually appears to be healed. There is a lot of  irritation at this point. 12/29/2019 upon evaluation today patient appears to  be doing well in regard to his lower extremities. His left medial thigh is open and somewhat draining today but this is the only region that is open the right has done much better with the treatment utilizing the steroid cream that I prescribed for him last week. Overall I am pleased in that regard. Fortunately there is no signs of active infection at this time. No fevers, chills, nausea, vomiting, or diarrhea. 01/05/2020 upon evaluation today patient appears to be doing more poorly in regard to his right lower extremity at this point upon evaluation today. Unfortunately he continues to have issues in this regard and I think the biggest issue is controlling his edema. This obviously is not very well controlled at this point is been recommended that he use the Lasix twice a day but he has not been able to do that. Unfortunately I think this is leading to an issue where honestly he is not really able to effectively control his edema and therefore the wounds really are not doing significantly better. I do not think that he is going to be able to keep things under good control unless he is able to control his edema much better. I discussed this again in great detail with him today. 01/12/2020 good news is patient actually appears to be doing quite well today at this point. He does have an appointment with lymphedema clinic tomorrow. His legs appear healed and the toe on the left is almost completely healed. In general I am very pleased with how things stand at this point. 01/19/2020 upon evaluation today patient appears to actually be doing well in regard to his lower extremities there is nothing open at this point. Fortunately he has done extremely well more recently. Has been seeing lymphedema clinic as well. With that being said he has Velcro wraps for his lower legs as well as his upper legs. The only wound really is on his toe  which is the right great toe and this is barely anything even there. With all that being said I think it is good to be appropriate today to go ahead and switch him over to the Velcro compression wraps. 01/26/2020 upon evaluation today patient appears to be doing worse with regard to his lower extremities after last week switch him to Velcro compression wraps. Unfortunately he lasted less than 24 hours he did not have the sock portion of his Velcro wrap on the left leg and subsequently developed a blister underneath the Velcro portion. Obviously this is not good and not what we were looking for at this point. He states the lymphedema clinic did tell him to wear the wrap for 23 hours and take him off for 1 I am okay with that plan but again right now we got a get things back under control again he may have some cellulitis noted as well. 02/02/2020 upon evaluation today patient unfortunately appears to have several areas of blistering on his bilateral lower extremities today mainly on the feet. His legs do seem to be doing somewhat better which is good news. Fortunately there is no evidence of active infection at this time. No fevers, chills, nausea, vomiting, or diarrhea. 02/16/2020 upon evaluation today patient appears to be doing well at this time with regard to his legs. He has a couple weeping areas on his toes but for the most part everything is doing better and does appear to be sealed up on his legs which is excellent news. We can continue with wrapping him  at this point as he had every time we discontinue the wraps he just breaks out with new wounds. There is really no point in is going forward with this at this point. 03/08/2020 upon evaluation today patient actually appears to be doing quite well with regard to his lower extremity ulcers. He has just a very superficial and really almost nonexistent blister on the left lower extremity he has in general done very well with the compression wraps. With  that being said I do not see any signs of infection at this time which is good news. 03/29/2020 upon evaluation today patient appears to be doing well with regard to his wounds currently except for where he had several new areas that opened up due to some of the wrap slipping and causing him trouble. He states he did not realize they had slipped. Nonetheless he has a 1 area on the right and 3 new areas on the left. Fortunately there is no signs of active infection at this time which is great news. 04/05/2020 upon evaluation today patient actually appears to be doing quite well in general in regard to his legs currently. Fortunately there is no signs of active infection at this time. No fevers, chills, nausea, vomiting, or diarrhea. He tells me next week that he will actually be seen in the lymphedema clinic on Thursday at 10 AM I see him on Wednesday next week. 04/12/2020 upon evaluation today patient appears to be doing very well with regard to his lower extremities bilaterally. In fact he does not appear to have any open wounds at this point which is good news. Fortunately there is no signs of active infection at this time. No fevers, chills, nausea, vomiting, or diarrhea. 04/19/2020 upon evaluation today patient appears to be doing well with regard to his wounds currently on the bilateral lower extremities. There does not appear to be any signs of active infection at this time. Fortunately there is no evidence of systemic infection and overall very pleased at this point. Nonetheless after I held him out last week he literally had blisters the next morning already which swelled up with him being right back here in the clinic. Overall I think that he is just not can be able to be discharged with his legs the way they are he is much to volume overloaded as far as fluid is concerned and that was discussed with him today of also discussed this but should try the clinic nurse manager as well as Dr.  Leanord Hawking. 04/26/2020 upon evaluation today patient appears to be doing better with regard to his wounds currently. He is making some progress and overall swelling is under good control with the compression wraps. Fortunately there is no evidence of active infection at this time. 05/10/2020 on evaluation today patient appears to be doing overall well in regard to his lower extremities bilaterally. He is Tolerating the compression wraps without complication and with what we are seeing currently I feel like that he is making excellent progress. There is no signs of active infection at this time. 05/24/2020 upon evaluation today patient appears to be doing well in regard to his legs. The swelling is actually quite a bit down compared to where it has been in the past. Fortunately there is no sign of active infection at this time which is also good news. With that being said he does have several wounds on his toes that have opened up at this point. 05/31/2020 upon evaluation today patient appears to  be doing well with regard to his legs bilaterally where he really has no significant fluid buildup at this point overall he seems to be doing quite well. Very pleased in this regard. With regard to his toes these also seem to be drying up which is excellent. We have continue to wrap him as every time we tried as a transition to the juxta light wraps things just do not seem to get any better. 06/07/2020 upon evaluation today patient appears to be doing well with regard to his right leg at this point. Unfortunately left leg has a lot of blistering he tells me the wrap started to slide down on him when he tried to put his other Velcro wrap over top of it to help keep things in order but nonetheless still had some issues. 06/14/2020 on evaluation today patient appears to be doing well with regard to his lower extremity ulcers and foot ulcers at this point. I feel like everything is actually showing signs of improvement which  is great news overall there is no signs of active infection at this time. No fevers, chills, nausea, vomiting, or diarrhea. Electronic Signature(s) Signed: 06/14/2020 11:00:35 AM By: Lenda Kelp PA-C Entered By: Lenda Kelp on 06/14/2020 11:00:34 -------------------------------------------------------------------------------- Physical Exam Details Patient Name: Date of Service: [name], [name] 06/14/2020 10:15 A M Medical Record Number: [id number] Patient Account Number: [id number] Date of Birth/Sex: Treating RN: [birth year] (69 y.o. [name] Primary Care Provider: Marney Setting, [name] Other Clinician: Referring Provider: Treating Provider/Extender: Lenda Kelp [facility] [name], [name] in Treatment: 229 Constitutional Obese and well-hydrated in no acute distress. Respiratory normal breathing without difficulty. Psychiatric this patient is able to make decisions and demonstrates good insight into disease process. Alert and Oriented x 3. pleasant and cooperative. Notes Patient's wound bed again showed signs of improvement at all locations on the left lower extremity and foot overall I feel like he is making good progress back to getting everything under good control. There is no signs of infection currently. Electronic Signature(s) Signed: 06/14/2020 11:00:55 AM By: Lenda Kelp PA-C Entered By: Lenda Kelp on 06/14/2020 11:00:55 -------------------------------------------------------------------------------- Physician Orders Details Patient Name: Date of Service: [name], [name] 06/14/2020 10:15 A M Medical Record Number: [id number] Patient Account Number: [id number] Date of Birth/Sex: Treating RN: Aug 15, 1951 (69 y.o. 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 Dressing Change Frequency Do not change entire dressing for one week. - both legs Skin Barriers/Peri-Wound Care Moisturizing lotion - both legs and feet and toes Wound Cleansing May shower with protection. Primary Wound Dressing Wound #177 Right T [place] oe Calcium Alginate with Silver Wound #182 Left,Lateral Lower Leg Calcium Alginate with Silver Wound #178 Right T Second oe Calcium Alginate with Silver Wound #180 Left,Proximal,Dorsal Foot Calcium Alginate with Silver Wound #181 Left T Second oe Calcium Alginate with Silver Secondary Dressing Wound #177 Right T  [place] Kerlix/Rolled Gauze Dry Gauze Wound #178 Right T Second oe Kerlix/Rolled Gauze Dry Gauze Wound #180 Left,Proximal,Dorsal Foot Dry Gauze Wound #182 Left,Lateral Lower Leg Dry Gauze Wound #181 Left T Second oe Kerlix/Rolled Gauze Dry Gauze Edema Control 4 layer compression: Left lower extremity Unna Boot to Right Lower Extremity Avoid standing for long periods of time Elevate legs to the level of the heart or above for 30 minutes daily and/or when sitting, a frequency of: - throughout the day Exercise regularly Electronic Signature(s) Signed: 06/14/2020 6:14:05 PM By: Zenaida Deed RN, BSN Signed: 06/15/2020 10:06:45 AM By: Lenda Kelp PA-C Entered By: Zenaida Deed on 06/14/2020  11:02:20 -------------------------------------------------------------------------------- Problem List Details Patient Name: Date of Service: [facility], Ahmon J. 06/14/2020 10:15 A M Medical Record Number: [id number] Patient Account Number: [id number] Date of Birth/Sex: Treating RN: 01-Jan-1951 (70 y.o. [name] Primary Care Provider: Marney Setting, [name] Other Clinician: Referring Provider: Treating Provider/Extender: Suzy Bouchard, [name] in Treatment: 229 Active Problems ICD-10 Encounter Code Description Active Date MDM Diagnosis 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 skin 06/30/2018 No Yes L97.221 Non-pressure chronic ulcer of left calf limited to breakdown of skin 09/30/2016 No Yes 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 of 01/22/2016 No Yes bilateral lower extremity I89.0 Lymphedema, not elsewhere classified 01/22/2016 No Yes E11.622 Type 2 diabetes mellitus with other skin ulcer 01/22/2016 No Yes 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, unspecified 01/22/2016 No Yes L03.116 Cellulitis of left lower limb 04/01/2017 No Yes Inactive Problems ICD-10 Code Description Active Date Inactive Date 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 skin 06/30/2017 06/30/2017 L97.521 Non-pressure chronic ulcer of other 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 of skin 04/27/2018 04/27/2018 L03.115 Cellulitis of right lower limb 12/22/2017 12/22/2017 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 06/30/2018 06/30/2018 L97.511 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 of skin 06/30/2018 06/30/2018 Resolved Problems Electronic Signature(s) Signed: 06/14/2020 10:17:13 AM By: Lenda Kelp PA-C Entered By: Lenda Kelp on 06/14/2020  10:17:11 -------------------------------------------------------------------------------- Progress Note Details Patient Name: Date of Service: [name], [place] J. 06/14/2020 10:15 A M Medical Record Number: [id number] Patient Account Number: [id number] Date of Birth/Sex: Treating RN: 07-Nov-1950 (69 y.o. [name] Primary Care Provider: Marney Setting, [name] Other Clinician: Referring Provider: Treating Provider/Extender: Lenda Kelp MCGO [name], [name] in Treatment: 229 Subjective Chief Complaint Information obtained from Patient patient is here for evaluation venous/lymphedema weeping History of Present Illness (HPI) Referred by PCP for consultation. Patient has long standing history of BLE venous stasis, no prior ulcerations. At beginning of month, developed cellulitis and weeping. Received IM Rocephin followed by Keflex and resolved. Wears compression stocking, appr 6 months old. Not sure strength. No present drainage. 01/22/16 this is a patient who is a type II diabetic on insulin. 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 He tells me he religiously wears pressure stockings of uncertain strength. He was here with weeping edema about 8 months ago but did not have an open wound. Roughly a month ago he had a reopening on his bilateral legs. He is been using bandages and Neosporin. He does not complain of pain. 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 Last BUN/creatinine I have is  from 11/20/15 at 13 and 1.0 respectively 01/29/16; patient arrives today having tolerated the Profore wrap. He brought in his stockings and these are 18 mmHg stockings he bought from [place]. The compression here is likely inadequate. He does not complain of pain or excessive drainage she has no systemic symptoms. The wound on the right looks improved as does the one  on the left although one on the left is more substantial with still tissue at risk below the actual wound area on the bilateral posterior calf 02/05/16; patient arrives with poor edema control. He states that we did put a 4 layer compression on it last week. No weight appear 5 this. 02/12/16; the area on the posterior right Has healed. The left Has a substantial wound that has necrotic surface eschar that requires a debridement with a curette. 02/16/16;the patient called or a Nurse visit secondary to increased swelling. He had been in earlier in the week with his right leg healed. He was transitioned to is on pressure stocking on the right leg with the only open wound on the left, a substantial area on the left posterior calf. Note he has a history of severe lower extremity edema, he has a history of chronic atrial fibrillation but not heart failure per my notes but I'll need to research this. He is not complaining of chest pain shortness of breath or orthopnea. The intake nurse noted blisters on the previously closed right leg 02/19/16; this is the patient's regular visit day. I see him on Friday with escalating edema new wounds on the right leg and clear signs of at least right ventricular heart failure. I increased his Lasix to 40 twice a day. He is returning currently in follow-up. States he is noticed a decrease in that the edema 02/26/16 patient's legs have much less edema. There is nothing really open on the right leg. The left leg has improved condition of the large superficial wound on the posterior left leg 03/04/16; edema control is very much better. The patient's right leg wounds have healed. On the left leg he continues to have severe venous inflammation on the posterior aspect of the left leg. There is no tenderness and I don't think any of this is cellulitis. 03/11/16; patient's right leg is married healed and he is in his own stocking. The patient's left leg has deteriorated somewhat. There is a  lot of erythema around the wound on the posterior left leg. There is also a significant rim of erythema posteriorly just above where the wrap would've ended there is a new wound in this location and a lot of tenderness. Can't rule out cellulitis in this area. 03/15/16; patient's right leg remains healed and he is in his own stocking. The patient's left leg is much better than last review. His major wound on the posterior aspect of his left Is almost fully epithelialized. He has 3 small injuries from the wraps. Really. Erythema seems a lot better on antibiotics 03/18/16; right leg remains healed and he is in his own stocking. The patient's left leg is much better. The area on the posterior aspect of the left calf is fully epithelialized. His 3 small injuries which were wrap injuries on the left are improved only one seems still open his erythema has resolved 03/25/16; patient's right leg remains healed and he is in his own stocking. There is no open area today on the left leg posterior leg is completely closed up. His wrap injuries at the superior aspect of his  leg are also resolved. He looks as though he has some irritation on the dorsal ankle but this is fully epithelialized without evidence of infection. 03/28/16; we discharged this patient on Monday. Transitioned him into his own stocking. There were problems almost immediately with uncontrolled swelling weeping edema multiple some of which have opened. He does not feel systemically unwell in particular no chest pain no shortness of breath and he does not feel 04/08/16; the edema is under better control with the Profore light wrap but he still has pitting edema. There is one large wound anteriorly 2 on the medial aspect of his left leg and 3 small areas on the superior posterior calf. Drainage is not excessive he is tolerating a Profore light well 04/15/16; put a Profore wrap on him last week. This is controlled is edema however he had a lot of pain on  his left anterior foot most of his wounds are healed 04/22/16 once again the patient has denuded areas on the left anterior foot which he states are because his wrap slips up word. He saw his primary physician today is on Lasix 40 twice a day and states that he his weight is down 20 pounds over the last 3 months. 04/29/16: Much improved. left anterior foot much improved. He is now on Lasix 80 mg per day. Much improved edema control 05/06/16; I was hoping to be able to discharge him today however once again he has blisters at a low level of where the compression was placed last week mostly on his left lateral but also his left medial leg and a small area on the anterior part of the left foot. 05/09/16; apparently the patient went home after his appointment on 7/4 later in the evening developing pain in his upper medial thigh together with subjective fever and chills although his temperature was not taken. The pain was so intense he felt he would probably have to call 911. However he then remembered that he had leftover doxycycline from a previous round of antibiotics and took these. By the next morning he felt a lot better. He called and spoke to one of our nurses and I approved doxycycline over the phone thinking that this was in relation to the wounds we had previously seen although they were definitely were not. The patient feels a lot better old fever no chills he is still working. Blood sugars are reasonably controlled 05/13/16; patient is back in for review of his cellulitis on his anterior medial upper thigh. He is taking doxycycline this is a lot better. Culture I did of the nodular area on the dorsal aspect of his foot grew MRSA this also looks a lot better. 05/20/16; the patient is cellulitis on the medial upper thigh has resolved. All of his wound areas including the left anterior foot, areas on the medial aspect of the left calf and the lateral aspect of the calf at all resolved. He has a new  blister on the left dorsal foot at the level of the fourth toe this was excised. No evidence of infection 05/27/16; patient continues to complain weeping edema. He has new blisterlike wounds on the left anterior lateral and posterior lateral calf at the top of his wrap levels. The area on his left anterior foot appears better. He is not complaining of fever, pain or pruritus in his feet. 05/30/16; the patient's blisters on his left anterior leg posterior calf all look improved. He did not increase the Lasix 100 mg as I suggested  because he was going to run out of his 40 mg tablets. He is still having weeping edema of his toes 06/03/16; I renewed his Lasix at 80 mg once a day as he was about to run out when I last saw him. He is on 80 mg of Lasix now. I have asked him to cut down on the excessive amount of water he was drinking and asked him to drink according to his thirst mechanisms 06/12/2016 -- was seen 2 days ago and was supposed to wear his compression stockings at home but he is developed lymphedema and superficial blisters on the left lower extremity and hence came in for a review 06/24/16; the remaining wound is on his left anterior leg. He still has edema coming from between his toes. There is lymphedema here however his edema is generally better than when I last saw this. He has a history of atrial fibrillation but does not have a known history of congestive heart failure nevertheless I think he probably has this at least on a diastolic basis. 07/01/16 I reviewed his echocardiogram from January 2017. This was essentially normal. He did not have LVH, EF of 55-60%. His right ventricular function was normal although he did have trivial tricuspid and pulmonic regurgitation. This is not audible on exam however. I increased his Lasix to do massive edema in his legs well above his knees I think in early July. He was also drinking an excessive amount of water at the time. 07/15/16; missed his appointment  last week because of the Labor Day holiday on Monday. He could not get another appointment later in the week. Started to feel the wrap digging in superiorly so we remove the top half and the bottom half of his wrap. He has extensive erythema and blistering superiorly in the left leg. Very tender. Very swollen. Edema in his foot with leaking edema fluid. He has not been systemically unwell 07/22/16; the area on the left leg laterally required some debridement. The medial wounds look more stable. His wrap injury wounds appear to have healed. Edema and his foot is better, weeping edema is also better. He tells me he is meeting with the supplier of the external compression pumps at work 08/05/16; the patient was on vacation last week in [facility]. His wrap is been on for an extended period of time. Also over the weekend he developed an extensive area of tender erythema across his anterior medial thigh. He took to doxycycline yesterday that he had leftover from a previous prescription. The patient complains of weeping edema coming out of his toes 08/08/16; I saw this patient on 10/2. He was tender across his anterior thigh. I put him on doxycycline. He returns today in follow-up. He does not have any open wounds on his lower leg, he still has edema weeping into his toes. 08/12/16; patient was seen back urgently today to follow-up for his extensive left thigh cellulitis/erysipelas. He comes back with a lot less swelling and erythema pain is much better. I believe I gave him Augmentin and Cipro. His wrap was cut down as he stated a roll down his legs. He developed blistering above the level of the wrap that remained. He has 2 open blisters and 1 intact. 08/19/16; patient is been doing his primary doctor who is increased his Lasix from 40-80 once a day or 80 already has less edema. Cellulitis has remained improved in the left thigh. 2 open areas on the posterior left calf 08/26/16; he returns  today having new  open blisters on the anterior part of his left leg. He has his compression pumps but is not yet been shown how to use some vital representative from the supplier. 09/02/16 patient returns today with no open wounds on the left leg. Some maceration in his plantar toes 09/10/2016 -- Dr. Leanord Hawking had recently discharged him on 09/02/2016 and he has come right back with redness swelling and some open ulcers on his left lower extremity. He says this was caused by trying to apply his compression stockings and he's been unable to use this and has not been able to use his lymphedema pumps. He had some doxycycline leftover and he has started on this a few days ago. 09/16/16; there are no open wounds on his leg on the left and no evidence of cellulitis. 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 He does not complain of symptoms here. I am not clear use using his external compression pumps. 09/23/16; I have not seen this patient in 2 weeks. He canceled his appointment 10 days ago as he was going on vacation. He tells me that on Monday he noticed a large area on his posterior left leg which is been draining copiously and is reopened into a large wound. He is been using ABDs and the external part of his juxtalite, according to our nurse this was not on properly. 10/07/16; Still a substantial area on the posterior left leg. Using silver alginate 10/14/16; in general better although there is still open area which looks healthy. Still using silver alginate. He reminds me that this happen before he left for [facility] T oday while he was showering in the morning. He had been using his juxtalite's 10/21/16; the area on his posterior left leg is fully epithelialized. However he arrives today with a large area of tender erythema in his medial and posterior left thigh just above the knee. I have marked the area. Once again he is reluctant to consider hospitalization.  I treated him with oral antibiotics in the past for a similar situation with resolution I think with doxycycline however this area it seems more extensive to me. He is not complaining of fever but does have chills and says states he is thirsty. His blood sugar today was in the 140s at home 10/25/16 the area on his posterior left leg is fully epithelialized although there is still some weeping edema. The large area of tenderness and erythema in his medial and posterior left thigh is a lot less tender although there is still a lot of swelling in this thigh. He states he feels a lot better. He is on doxycycline and Augmentin that I started last week. This will continued until Tuesday, December 26. I have ordered a duplex ultrasound of the left thigh rule out DVT whether there is an abscess something that would need to be drained I would also like to know. 11/01/16; he still has weeping edema from a not fully epithelialized area on his left posterior calf. Most of the rest of this looks a lot better. He has completed his antibiotics. His thigh is a lot better. Duplex ultrasound did not show a DVT in the thigh 11/08/16; he comes in today with more Denuded surface epithelium from the posterior aspect of his calf. There is no real evidence of cellulitis. The superior aspect of his wrap appears to have put quite an indentation in his leg just below the knee  and this may have contributed. He does not complain of pain or fever. We have been using silver alginate as the primary dressing. The area of cellulitis in the right thigh has totally resolved. He has been using his compression stockings once a week 11/15/16; the patient arrives today with more loss of epithelium from the posterior aspect of his left calf. He now has a fairly substantial wound in this area. The reason behind this deterioration isn't exactly clear although his edema is not well controlled. He states he feels he is generally more swollen  systemically. He is not complaining of chest pain shortness of breath fever. T me he has an appointment with his primary physician in early February. He is on 80 mg of oral ells Lasix a day. He claims compliance with the external compression pumps. He is not having any pain in his legs similar to what he has with his recurrent cellulitis 11/22/16; the patient arrives a follow-up of his large area on his left lateral calf. This looks somewhat better today. He came in earlier in the week for a dressing change since I saw him a week ago. He is not complaining of any pain no shortness of breath no chest pain 11/28/16; the patient arrives for follow-up of his large area on the left lateral calf this does not look better. In fact it is larger weeping edema. The surface of the wound does not look too bad. We have been using silver alginate although I'm not certain that this is a dressing issue. 12/05/16; again the patient follows up for a large wound on the left lateral and left posterior calf this does not look better. There continues to be weeping edema necrotic surface tissue. More worrisome than this once again there is erythema below the wound involving the distal Achilles and heel suggestive of cellulitis. He is on his feet working most of the day of this is not going well. We are changing his dressing twice a week to facilitate the drainage. 12/12/16; not much change in the overall dimensions of the large area on the left posterior calf. This is very inflamed looking. I gave him an. Doxycycline last week does not really seem to have helped. He found the wrap very painful indeed it seems to of dog into his legs superiorly and perhaps around the heel. He came in early today because the drainage had soaked through his dressings. 12/19/16- patient arrives for follow-up evaluation of his left lower extremity ulcers. He states that he is using his lymphedema pumps once daily when there is "no drainage". He admits  to not using his lipedema pumps while under current treatment. His blood sugars have been consistently between 150-200. 12/26/16; the patient is not using his compression pumps at home because of the wetness on his feet. I've advised him that I think it's important for him to use this daily. He finds his feet too wet, he can put a plastic bag over his legs while he is in the pumps. Otherwise I think will be in a vicious circle. We are using silver alginate to the major area on his left posterior calf 01/02/17; the patient's posterior left leg has further of all into 3 open wounds. All of them covered with a necrotic surface. He claims to be using his compression pumps once a day. His edema control is marginal. Continue with silver alginate 01/10/17; the patient's left posterior leg actually looks somewhat better. There is less edema, less erythema. Still has  3 open areas covered with a necrotic surface requiring debridement. He claims to be using his compression pumps once a day his edema control is better 01/17/17; the patient's left posterior calf look better last week when I saw him and his wrap was changed 2 days ago. He has noted increasing pain in the left heel and arrives today with much larger wounds extensive erythema extending down into the entire heel area especially tender medially. He is not systemically unwell CBGs have been controlled no fever. Our intake nurse showed me limegreen drainage on his AVD pads. 01/24/17; his usual this patient responds nicely to antibiotics last week giving him Levaquin for presumed Pseudomonas. The whole entire posterior part of his leg is much better much less inflamed and in the case of his Achilles heel area much less tender. He has also had some epithelialization posteriorly there are still open areas here and still draining but overall considerably better 01/31/17- He has continue to tolerate the compression wraps. he states that he continues to use the  lymphedema pumps daily, and can increase to twice daily on the weekends. He is voicing no complaints or concerns regarding his LLE ulcers 02/07/17-he is here for follow-up evaluation. He states that he noted some erythema to the left medial and anterior thigh, which he states is new as of yesterday. He is concerned about recurrent cellulitis. He states his blood sugars have been slightly elevated, this morning in the 180s 02/14/17; he is here for follow-up evaluation. When he was last here there was erythema superiorly from his posterior wound in his anterior thigh. He was prescribed Levaquin however a culture of the wound surface grew MRSA over the phone I changed him to doxycycline on Monday and things seem to be a lot better. 02/24/17; patient missed his appointment on Friday therefore we changed his nurse visit into a physician visit today. Still using silver alginate on the large area of the posterior left thigh. He isn't new area on the dorsal left second toe 03/03/17; actually better today although he admits he has not used his external compression pumps in the last 2 days or so because of work responsibilities over the weekend. 03/10/17; continued improvement. External compression pumps once a day almost all of his wounds have closed on the posterior left calf. Better edema control 03/17/17; in general improved. He still has 3 small open areas on the lateral aspect of his left leg however most of the area on the posterior part of his leg is epithelialized. He has better edema control. He has an ABD pad under his stocking on the right anterior lower leg although he did not let us look at that today. 03/24/17; patient arrives back in clinic today with no open areas however there are areas on the posterior left calf and anterior left calf that are less than 100% epithelialized. His edema is well controlled in the left lower leg. There is some pitting edema probably lymphedema in the left upper thigh. He  uses compression pumps at home once per day. I tried to get him to do this twice a day although he is very reticent. 04/01/2017 -- for the last 2 days he's had significant redness, tenderness and weeping and came in for an urgent visit today. 04/07/17; patient still has 6 more days of doxycycline. He was seen by Dr. Meyer RusselBritto last Wednesday for cellulitis involving the posterior aspect, lateral aspect of his Involving his heel. For the most part he is better there is  less erythema and less weeping. He has been on his feet for 12 hours o2 over the weekend. Using his compression pumps once a day 04/14/17 arrives today with continued improvement. Only one area on the posterior left calf that is not fully epithelialized. He has intense bilateral venous inflammation associated with his chronic venous insufficiency disease and secondary lymphedema. We have been using silver alginate to the left posterior calf wound In passing he tells [place] today that the right leg but we have not seen in quite some time has an open area on it but he doesn't want [place] to look at this today states he will show this to [place] next week. 04/21/17; there is no open area on his left leg although he still reports some weeping edema. He showed [place] his right leg today which is the first time we've seen this leg in a long time. He has a large area of open wound on the right leg anteriorly healthy granulation. Quite a bit of swelling in the right leg and some degree of venous inflammation. He told us about the right leg in passing last week but states that deterioration in the right leg really only happened over the weekend 04/28/17; there is no open area on the left leg although there is an irritated part on the posterior which is like a wrap injury. The wound on the right leg which was new from last week at least to [place] is a lot better. 05/05/17; still no open area on the left leg. Patient is using his new compression stocking which seems to be doing  a good job of controlling the edema. He states he is using his compression pumps once per day. The right leg still has an open wound although it is better in terms of surface area. Required debridement. A lot of pain in the posterior right Achilles marked tenderness. Usually this type of presentation this patient gives concern for an active cellulitis 05/12/17; patient arrives today with his major wound from last week on the right lateral leg somewhat better. Still requiring debridement. He was using his compression stocking on the left leg however that is reopened with superficial wounds anteriorly he did not have an open wound on this leg previously. He is still using his juxta light's once daily at night. He cannot find the time to do this in the morning as he has to be at work by 7 AM 05/19/17; right lateral leg wound looks improved. No debridement required. The concerning area is on the left posterior leg which appears to almost have a subcutaneous hemorrhagic component to it. We've been using silver alginate to all the wounds 05/26/17; the right lateral leg wound continues to look improved. However the area on the left posterior calf is a tightly adherent surface. [place] using silver alginate. Because of the weeping edema in his legs there is very little good alternatives. 06/02/17; the patient left here last week looking quite good. Major wound on the left posterior calf and a small one on the right lateral calf. Both of these look satisfactory. He tells me that by Wednesday he had noted increased pain in the left leg and drainage. He called on Thursday and Friday to get an appointment here but we were blocked. He did not go to urgent care or his primary physician. He thinks he had a fever on Thursday but did not actually take his temperature. He has not been using his compression pumps on the left leg because  of pain. I advised him to go to the emergency room today for IV antibiotics for stents of  left leg cellulitis but he has refused I have asked him to take 2 days off work to keep his leg elevated and he has refused this as well. In view of this I'm going to call him and Augmentin and doxycycline. 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 However after a detailed discussion regarding the risk benefits and alternatives he refuses to get admitted to the hospital. With no other recourse I will continue him on oral antibiotics as before and hopefully he'll have his infectious disease consultation this week. 06/16/2017 -- the patient was seen today by the nurse practitioner at infectious disease Ms. Dixon. 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 He has also been advise Lotrimin cream locally. she also had wise IV antibiotics for his condition if it flares up 06/23/17; patient arrives today with drainage bilaterally although the remaining wound on the left posterior calf after cleaning up today "highlighter yellow drainage" did not look too bad. Unfortunately he has had breakdown on the right anterior leg [previously this leg had not been open and he is using a black stocking] he went to see infectious disease and is been put on clindamycin 150 mg daily, I did not verify the dose although I'm not familiar with using clindamycin in this dosing range, perhaps for prophylaxisoo 06/27/17; I brought this patient back today to follow-up on the wound deterioration on the right lower leg together with surrounding cellulitis. I started him on doxycycline 4 days ago. This area looks  better however he comes in today with intense cellulitis on the medial part of his left thigh. This is not have a wound in this area. Extremely tender. We've been using silver alginate to the wounds on the right lower leg left lower leg with bilateral 4 layer compression he is using his external compression pumps once a day 07/04/17; patient's left medial thigh cellulitis looks better. He has not been using his compression pumps as his insert said it was contraindicated with cellulitis. His right leg continues to make improvements all the wounds are still open. We only have one remaining wound on the left posterior calf. Using silver alginate to all open areas. He is on doxycycline which I started a week ago and should be finishing I gave him Augmentin after Thursday's visit for the severe cellulitis on the left medial thigh which fortunately looks better 07/14/17; the patient's left medial thigh cellulitis has resolved. The cellulitis in his right lower calf on the right also looks better. All of his wounds are stable to improved we've been using silver alginate he has completed the antibiotics I have given him. He has clindamycin 150 mg once a day prescribed by infectious disease for prophylaxis, I've advised him to start this now. We have been using bilateral Unna boots over silver alginate to the wound areas 07/21/17; the patient is been to see infectious disease who noted his recurrent problems with cellulitis. He was not able to  tolerate prophylactic clindamycin therefore he is on amoxicillin 500 twice a day. He also had a second daily dose of Lasix added By Dr. Oneta Rack but he is not taking this. Nor is he being completely compliant with his compression pumps a especially not this week. He has 2 remaining wounds one on the right posterior lateral lower leg and one on the left posterior medial lower leg. 07/28/17; maintain on Amoxil 500 twice a day as prophylaxis for recurrent cellulitis as ordered by  infectious disease. The patient has Unna boots bilaterally. 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 He has bilateral Unna boots for compression and silver alginate to his wounds. Arrives today with his legs looking as good as I have seen him in quite some time. Not surprisingly his wounds look better as well with improvement on the right lateral leg venous insufficiency wound and also the left medial leg. He is still using the compression pumps once a day 08/11/17; both legs appear to be doing better wounds on the right lateral and left medial legs look better. Skin on the right leg quite good. He is been using silver alginate as the primary dressing. I'm going to use Anasept gel calcium alginate and maintain all the secondary dressings 08/18/17; the patient continues to actually do quite well. The area on his right lateral leg is just about closed the left medial also looks better although it is still moist in this area. His edema is well controlled we have been using Anasept gel with calcium alginate and the usual secondary dressings, 4 layer compression and once daily use of his compression pumps "always been able to manage 09/01/17; the patient continues to do reasonably well in spite of his trip to [place]. The area on the right lateral leg is epithelialized. Left is much better but still open. He has more edema and more chronic erythema on the left leg [venous inflammation] 09/08/17; he arrives today with no open wound on the right lateral leg and decently controlled edema. Unfortunately his left leg is not nearly as in his good situation as last week.he apparently had increasing edema starting on Saturday. He edema soaked through into his foot so used a plastic bag to walk around his home. The area on the medial right leg which was his open  area is about the same however he has lost surface epithelium on the left lateral which is new and he has significant pain in the Achilles area of the left foot. He is already on amoxicillin chronically for prophylaxis of cellulitis in the left leg 09/15/17; he is completed a week of doxycycline and the cellulitis in the left posterior leg and Achilles area is as usual improved. He still has a lot of edema and fluid soaking through his dressings. There is no open wound on the right leg. He saw infectious disease NP today 09/22/17;As usual 1 we transition him from our compression wraps to his stockings things did not go well. He has several small open areas on the right leg. He states this was caused by the compression wrap on his skin although he did not wear this with the stockings over them. He has several superficial areas on the left leg medially laterally posteriorly. He does not have any evidence of active cellulitis especially involving the left Achilles The patient is traveling from  Saturday's Saturday going to [facility]. He states he isn't attempting to get an appointment with a heel objects wound center there to change his dressings. I am not completely certain whether this will work 10/06/17; the patient came in on Friday for a nurse visit and the nurse reported that his legs actually look quite good. He arrives in clinic today for his regular follow-up visit. He has a new wound on his left third toe over the PIP probably caused by friction with his footwear. He has small areas on the left leg and a very superficial but epithelialized area on the right anterior lateral lower leg. Other than that his legs look as good as I've seen him in quite some time. We have been using silver alginate Review of systems; no chest pain no shortness of breath other than this a 10 point review of systems negative 10/20/17; seen by Dr. Meyer Russel last week. He had taken some antibiotics [doxycycline] that he had  left over. Dr. Meyer Russel thought he had candida infection and declined to give him further antibiotics. He has a small wound remaining on the right lateral leg several areas on the left leg including a larger area on the left posterior several left medial and anterior and a small wound on the left lateral. The area on the left dorsal third toe looks a lot better. ROS; Gen.; no fever, respiratory no cough no sputum Cardiac no chest pain other than this 10 point review of system is negative 10/30/17; patient arrives today having fallen in the bathtub 3 days ago. It took him a while to get up. He has pain and maceration in the wounds on his left leg which have deteriorated. He has not been using his pumps he also has some maceration on the right lateral leg. 11/03/17; patient continues to have weeping edema especially in the left leg. This saturates his dressings which were just put on on 12/27. As usual the doxycycline seems to take care of the cellulitis on his lower leg. He is not complaining of fever, chills, or other systemic symptoms. He states his leg feels a lot better on the doxycycline I gave him empirically. He also apparently gets injections at his primary doctor's officeo Rocephin for cellulitis prophylaxis. I didn't ask him about his compression pump compliance today I think that's probably marginal. Arrives in the clinic with all of his dressings primary and secondary macerated full of fluid and he has bilateral edema 11/10/17; the patient's right leg looks some better although there is still a cluster of wounds on the right lateral. The left leg is inflamed with almost circumferential skin loss medially to laterally although we are still maintaining anteriorly. He does not have overt cellulitis there is a lot of drainage. He is not using compression pumps. We have been using silver alginate to the wound areas, there are not a lot of options here 11/17/17; the patient's right leg continues to be  stable although there is still open wounds, better than last week. The inflammation in the left leg is better. Still loss of surface layer epithelium especially posteriorly. There is no overt cellulitis in the amount of edema and his left leg is really quite good, tells me he is using his compression pumps once a day. 11/24/17; patient's right leg has a small superficial wound laterally this continues to improve. The inflammation in the left leg is still improving however we have continuous surface layer epithelial loss posteriorly. There is no overt cellulitis  in the amount of edema in both legs is really quite good. He states he is using his compression pumps on the left leg once a day for 5 out of 7 days 12/01/17; very small superficial areas on the right lateral leg continue to improve. Edema control in both legs is better today. He has continued loss of surface epithelialization and left posterior calf although I think this is better. We have been using silver alginate with large number of absorptive secondary dressings 4 layer on the left Unna boot on the right at his request. He tells me he is using his compression pumps once a day 12/08/17; he has no open area on the right leg is edema control is good here. ooOn the left leg however he has marked erythema and tenderness breakdown of skin. He has what appears to be a wrap injury just distal to the popliteal fossa. 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 There was swelling erythema and breakdown in the right leg. He has much worse skin breakdown in the left leg as well multiple open areas medially and posteriorly as well as laterally. He tells me he has been using  his compression pumps but tells me he feels that the drainage out of his leg is worse when he uses a compression pumps. T be fair to him he is been saying this o for a while however I don't know that I have really been listening to this. I wonder if the compression pumps are working properly 12/22/17;. Once again he arrives with severe erythema, weeping edema from the left greater than right leg. Noncompliance with compression pumps. New this visit he is complaining of pain on the lateral aspect of the right leg and the medial aspect of his right thigh. He apparently saw his cardiologist Dr. [name] who was ordered an echocardiogram area and I think this is a step in the right direction 12/25/17; started his doxycycline Monday night. There is still intense erythema of the right leg especially in the anterior thigh although there is less tenderness. The erythema around the wound on the right lateral calf also is less tender. He still complaining of pain in the left heel. His wounds are about the same right lateral left medial left lateral. Superficial but certainly not close to closure. He denies being systemically unwell no fever chills no abdominal pain no diarrhea 12/29/17; back in follow-up of his extensive right calf and right thigh cellulitis. I added amoxicillin to cover possible doxycycline resistant strep. This seems to of done the trick he is in much less pain there is much less erythema and swelling. He has his echocardiogram at 11:00 this morning. X-ray of the left heel was also negative. 01/05/18; the patient arrived with his edema under much better control. Now that he is retired he is able to use his compression pumps daily and sometimes twice a day per the patient. He has a wound on the right leg the lateral wound looks better. Area on the left leg also looks a lot better. He has no evidence of cellulitis in his bilateral thighs I had a quick peak at his echocardiogram. He is in normal  ejection fraction and normal left ventricular function. He  has moderate pulmonary hypertension moderately reduced right ventricular function. One would have to wonder about chronic sleep apnea although he says he doesn't snore. He'll review the echocardiogram with his cardiologist. 01/12/18; the patient arrives with the edema in both legs under exemplary control. He is using his compression pumps daily and sometimes twice daily. His wound on the right lateral leg is just about closed. He still has some weeping areas on the posterior left calf and lateral left calf although everything is just about closed here as well. I have spoken with Aldean Baker who is the patient's nurse practitioner and infectious disease. She was concerned that the patient had not understood that the parenteral penicillin injections he was receiving for cellulitis prophylaxis was actually benefiting him. I don't think the patient actually saw that I would tend to agree we were certainly dealing with less infections although he had a serious one last month. 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 He is healed. He'll be placed in juxtalite compression wraps and increase his lymphedema pumps to twice daily. We will follow up again next week to ensure there are no issues with the new regiment. 01/20/18-he is here for evaluation of bilateral lower extremity weeping edema. Yesterday he was placed in compression wrap to the right lower extremity and compression stocking to left lower shrubbery. He states he uses lymphedema pumps last night and again this morning and noted a blister to the left lower extremity. On exam he was noted to have drainage to the right lower extremity. He will be placed in Unna boots bilaterally and follow-up next week 01/26/18; patient was actually discharged a week ago to his own juxta light stockings only to return the next day with bilateral lower extremity weeping  edema.he was placed in bilateral Unna boots. He arrives today with pain in the back of his left leg. There is no open area on the right leg however there is a linear/wrap injury on the left leg and weeping edema on the left leg posteriorly. I spoke with infectious disease about 10 days ago. They were disappointed that the patient elected to discontinue prophylactic intramuscular penicillin shots as they felt it was particularly beneficial in reducing the frequency of his cellulitis. I discussed this with the patient today. He does not share this view. He'll definitely need antibiotics today. Finally he is traveling to [place] and trauma leaving this Saturday and returning a week later and he does not travel with his pumps. He is going by car 01/30/18; patient was seen 4 days ago and brought back in today for review of cellulitis in the left leg posteriorly. I put him on amoxicillin this really hasn't helped as much as I might like. He is also worried because he is traveling to [facility] by car. Finally we will be rewrapping him. There is no open area on the right leg over his left leg has multiple weeping areas as usual 02/09/18; The same wrap on for 10 days. He did not pick up the last doxycycline I prescribed for him. He apparently took 4 days worth he already had. There is nothing open on his right leg and the edema control is really quite good. He's had damage in the left leg medially and laterally especially probably related to the prolonged use of Unna boots 02/12/18; the patient arrived in clinic today for a nurse visit/wrap change. He complained of a lot of pain in the left posterior calf. He is  taking doxycycline that I previously prescribed for him. Unfortunately even though he used his stockings and apparently used to compression pumps twice a day he has weeping edema coming out of the lateral part of his right leg. This is coming from the lower anterior lateral skin area. 02/16/18; the  patient has finished his doxycycline and will finish the amoxicillin 2 days. The area of cellulitis in the left calf posteriorly has resolved. He is no longer having any pain. He tells me he is using his compression pumps at least once a day sometimes twice. 02/23/18; the patient finished his doxycycline and Amoxil last week. On Friday he noticed a small erythematous circle about the size of a quarter on the left lower leg just above his ankle. This rapidly expanded and he now has erythema on the lateral and posterior part of the thigh. This is bright red. Also has an area on the dorsal foot just above his toes and a tender area just below the left popliteal fossa. He came off his prophylactic penicillin injections at his own insistence one or 2 months ago. This is obviously deteriorated since then 03/02/18; patient is on doxycycline and Amoxil. Culture I did last week of the weeping area on the back of his left calf grew group B strep. I have therefore renewed the amoxicillin 500 3 times a day for a further week. He has not been systemically unwell. Still complaining of an area of discomfort right under his left popliteal fossa. There is no open wound on the right leg. He tells me that he is using his pumps twice a day on most days 03/09/18; patient arrives in clinic today completing his amoxicillin today. The cellulitis on his left leg is better. Furthermore he tells me that he had intramuscular penicillin shots that his primary care office today. However he also states that the wrap on his right leg fell down shortly after leaving clinic last week. He developed a large blister that was present when he came in for a nurse visit later in the week and then he developed intense discomfort around this area.He tells me he is using his compression pumps 03/16/18; the patient has completed his doxycycline. The infectious part of this/cellulitis in the left heel area left popliteal area is a lot better. He has 2  open areas on the right calf. Still areas on the left calf but this is a lot better as well. 03/24/18; the patient arrives complaining of pain in the left popliteal area again. He thinks some of this is wrap injury. He has no open area on the right leg and really no open area on the left calf either except for the popliteal area. He claims to be compliant with the compression pumps 03/31/18; I gave him doxycycline last week because of cellulitis in the left popliteal area. This is a lot better although the surface epithelium is denuded off and response to this. He arrives today with uncontrolled edema in the right calf area as well as a fingernail injury in the right lateral calf. There is only a few open areas on the left 04/06/18; I gave him amoxicillin doxycycline over the last 2 weeks that the amoxicillin should be completing currently. He is not complaining of any pain or systemic symptoms. The only open areas see has is on the right lateral lower leg paradoxically I cannot see anything on the left lower leg. He tells me he is using his compression pumps twice a day on most  days. Silver alginate to the wounds that are open under 4 layer compression 04/13/18; he completed antibiotics and has no new complaints. Using his compression pumps. Silver alginate that anything that's opened 04/20/18; he is using his compression pumps religiously. Silver alginate 4 layer compression anything that's opened. He comes in today with no open wounds on the left leg but 3 on the right including a new one posteriorly. He has 2 on the right lateral and one on the right posterior. He likes Unna boots on the right leg for reasons that aren't really clear we had the usual 4 layer compression on the left. It may be necessary to move to the 4 layer compression on the right however for now I left them in the Unna boots 04/27/18; he is using his compression pumps at least once a day. He has still the wounds on the right lateral  calf. The area right posteriorly has closed. He does not have an open wound on the left under 4 layer compression however on the dorsal left foot just proximal to the toes and the left third toe 2 small open areas were identified 05/11/18; he has not uses compression pumps. The areas on the right lateral calf have coalesced into one large wound necrotic surface. On the left side he has one small wound anteriorly however the edema is now weeping out of a large part of his left leg. He says he wasn't using his pumps because of the weeping fluid. I explained to him that this is the time he needs to pump more 05/18/18; patient states he is using his compression pumps twice a day. The area on the right lateral large wound albeit superficial. On the left side he has innumerable number of small new wounds on the left calf particularly laterally but several anteriorly and medially. All these appear to have healthy granulated base these look like the remnants of blisters however they occurred under compression. The patient arrives in clinic today with his legs somewhat better. There is certainly less edema, less multiple open areas on the left calf and the right anterior leg looks somewhat better as well superficial and a little smaller. However he relates pain and erythema over the last 3-4 days in the thigh and I looked at this today. He has not been systemically unwell no fever no chills no change in blood sugar values 05/25/18; comes in today in a better state. The severe cellulitis on his left leg seems better with the Keflex. Not as tender. He has not been systemically unwell ooHard to find an open wound on the left lower leg using his compression pumps twice a day ooThe confluent wounds on his right lateral calf somewhat better looking. These will ultimately need debridement I didn't do this today. 06/01/18; the severe cellulitis on the left anterior thigh has resolved and he is completed his  Keflex. ooThere is no open wound on the left leg however there is a superficial excoriation at the base of the third toe dorsally. Skin on the bottom of his left foot is macerated looking. ooThe left the wounds on the lateral right leg actually looks some better although he did require debridement of the top half of this wound area with an open curet 06/09/18 on evaluation today patient appears to be doing poorly in regard to his right lower extremity in particular this appears to likely be infected he has very thick purulent discharge along with a bright green tent to the discharge. This  makes me concerned about the possibility of pseudomonas. He's also having increased discomfort at this point on evaluation. Fortunately there does not appear to be any evidence of infection spreading to the other location at this time. 06/16/18 on evaluation today patient appears to actually be doing fairly well. His ulcer has actually diminished in size quite significantly at this point which is good news. Nonetheless he still does have some evidence of infection he did see infectious disease this morning before coming here for his appointment. I did review the results of their evaluation and their note today. They did actually have him discontinue the Cipro and initiate treatment with linezolid at this time. He is doing this for the next seven days and they recommended a follow-up in four months with them. He is the keep a log of the need for intermittent antibiotic therapy between now and when he falls back up with infectious disease. This will help them gaze what exactly they need to do to try and help them out. 06/23/18; the patient arrives today with no open wounds on the left leg and left third toe healed. He is been using his compression pumps twice a day. On the right lateral leg he still has a sizable wound but this is a lot better than last time I saw this. In my absence he apparently cultured MRSA coming  from this wound and is completed a course of linezolid as has been directed by infectious disease. Has been using silver alginate under 4 layer compression 06/30/18; the only open wound he has is on the right lateral leg and this looks healthy. No debridement is required. We have been using silver alginate. He does not have an open wound on the left leg. There is apparently some drainage from the dorsal proximal third toe on the left although I see no open wound here. 07/03/18 on evaluation today patient was actually here just for a nurse visit rapid change. However when he was here on Wednesday for his rat change due to having been healed on the left and then developing blisters we initiated the wrap again knowing that he would be back today for [place] to reevaluate and see were at. Unfortunately he has developed some cellulitis into the proximal portion of his right lower extremity even into the region of his thigh. 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 He was placed on one as what at that point. Nonetheless he is done with that and has been tolerating it well otherwise. Doxycycline which in the past really did not seem to be effective for him. Nonetheless I think the best option may be for [place] to definitely reinitiate the antibiotics for a longer period of time. 07/07/18; since I last saw this patient a week ago he has had a difficult time. At that point he did not have an open wound on his left leg. We transitioned him into juxta light stockings. He was apparently in the clinic the next day with blisters on the left lateral and left medial lower calf. He also had weeping edema fluid. He was put back into a compression wrap. He was also in the clinic on Friday with intense erythema in his right thigh. Per the patient he was started on Bactrim however that didn't work at all in terms of relieving his pain and swelling. He has taken 3 doxycycline that he had left  over from last time and that seems to  of helped. He has blistering on the right thigh as well. 07/14/18; the erythema on his right thigh has gotten better with doxycycline that he is finishing. 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 Presumably this cellulitis in the thigh was not related to that although I think this is a bit concerning going forward. He still has an area on the right lateral calf the blister on the right medial calf just below the knee that was discussed above. On the left 2 small open areas left medial and left lateral. Edema control is adequate. He is using his compression pumps twice a day 07/20/18; continued improvement in the condition of both legs especially the edema in his bilateral thighs. He tells me he is been losing weight through a combination of diet and exercise. He is using his compression pumps twice a day. So overall she made to the remaining wounds 07/27/2018; continued improvement in condition of both legs. His edema is well controlled. The area on the right lateral leg is just about closed he had one blisters show up on the medial left upper calf. We have him in 4 layer compression. He is going on a 10-day trip to [place]. He will be driving. He wants to wear Unna boots because of the lessening amount of constriction. He will not use compression pumps while he is away 08/05/18 on evaluation today patient actually appears to be doing decently well all things considered in regard to his bilateral lower extremities. The worst ulcer is actually only posterior aspect of his left lower extremity with a four layer compression wrap cut into his leg a couple weeks back. He did have a trip and actually had [employer] for the trip that he is worn since he was last here. Nonetheless he feels like the [employer] actually do better for him his swelling is up a  little bit but he also with his trip was not taking his Lasix on a regular set schedule like he was supposed to be. He states that obviously the reason being that he cannot drive and keep going without having to urinate too frequently which makes it difficult. He did not have his pumps with him while he was away either which I think also maybe playing a role here too. 08/13/2018; the patient only has a small open wound on the right lateral calf which is a big improvement in the last month or 2. He also has the area posteriorly just below the posterior fossa on the left which I think was a wrap injury from several weeks ago. He has no current evidence of cellulitis. He tells me he is back into his compression pumps twice a day. He also tells me that while he was at the laundromat somebody stole a section of his extremitease stockings 08/20/2018; back in the clinic with a much improved state. He only has small areas on the right lateral mid calf which is just about healed. This was is more substantial area for quite a prolonged period of time. He has a small open area on the left anterior tibia. The area on the posterior calf just below the popliteal fossa is closed today. He is using his compression pumps twice a day 08/28/2018; patient has no open wound on the right leg. He has a smattering of open areas on the calf with some weeping lymphedema.  More problematically than that it looks as though his wraps of slipped down in his usual he has very angry upper area of edema just below the right medial knee and on the right lateral calf. He has no open area on his feet. The patient is traveling to [facility] next week. I will send him in an antibiotic. We will continue to wrap the right leg. We ordered extremitease stockings for him last week and I plan to transition the right leg to a stocking when he gets home which will be in 10 days time. As usual he is very reluctant to take his pumps with him when  he travels 09/07/2018; patient returns from [facility] He shows me a picture of his left leg in the mid part of his trip last week with intense fire engine erythema. The picture look bad enough I would have considered sending him to the hospital. Instead he went to the wound care center in [facility]. They did not prescribe him antibiotics but he did take some doxycycline he had leftover from a previous visit. I had given him trimethoprim sulfamethoxazole before he left this did not work according to the patient. This is resulted in some improvement fortunately. He comes back with a large wound on the left posterior calf. Smaller area on the left anterior tibia. Denuded blisters on the dorsal left foot over his toes. Does not have much in the way of wounds on the right leg although he does have a very tender area on the right posterior area just below the popliteal fossa also suggestive of infection. He promises me he is back on his pumps twice a day 09/15/2018; the intense cellulitis in his left lower calf is a lot better. The wound area on the posterior left calf is also so better. However he has reasonably extensive wounds on the dorsal aspect of his second and third toes and the proximal foot just at the base of the toes. There is nothing open on the right leg 09/22/2018; the patient has excellent edema control in his legs bilaterally. He is using his external compression pumps twice a day. He has no open area on the right leg and only the areas in the left foot dorsally second and third toe area on the left side. He does not have any signs of active cellulitis. 10/06/2018; the patient has good edema control bilaterally. He has no open wound on the right leg. There is a blister in the posterior aspect of his left calf that we had to deal with today. He is using his compression pumps twice a day. There is no signs of active cellulitis. We have been using silver alginate to the wound  areas. He still has vulnerable areas on the base of his left first second toes dorsally He has a his extremities stockings and we are going to transition him today into the stocking on the right leg. He is cautioned that he will need to continue to use the compression pumps twice a day. If he notices uncontrolled edema in the right leg he may need to go to 3 times a day. 10/13/2018; the patient came in for a nurse check on Friday he has a large flaccid blister on the right medial calf just below the knee. We unroofed this. He has this and a new area underneath the posterior mid calf which was undoubtedly a blister as well. He also has several small areas on the right which is the  area we put his extremities stocking on. 10/19/2018; the patient went to see infectious disease this morning I am not sure if that was a routine follow-up in any case the doxycycline I had given him was discontinued and started on linezolid. He has not started this. It is easy to look at his left calf and the inflammation and think this is cellulitis however he is very tender in the tissue just below the popliteal fossa and I have no doubt that there is infection going on here. He states the problem he is having is that with the compression pumps the edema goes down and then starts walking the wrap falls down. We will see if we can adhere this. He has 1 or 2 minuscule open areas on the right still areas that are weeping on the posterior left calf, the base of his left second and third toes 10/26/18; back today in clinic with quite of skin breakdown in his left anterior leg. This may have been infection the area below the popliteal fossa seems a lot better however tremendous epithelial loss on the left anterior mid tibia area over quite inexpensive tissue. He has 2 blisters on the right side but no other open wound here. 10/29/2018; came in urgently to see [place] today and we worked him in for review. He states that the 4 layer  compression on the right leg caused pain he had to cut it down to roughly his mid calf this caused swelling above the wrap and he has blisters and skin breakdown today. As a result of the pain he has not been using his pumps. Both legs are a lot more edematous and there is a lot of weeping fluid. 11/02/18; arrives in clinic with continued difficulties in the right leg> left. Leg is swollen and painful. multiple skin blisters and new open areas especially laterally. He has not been using his pumps on the right leg. 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 He is not systemically unwell. 11/09/2018; the patient claims he is being compliant with his pumps. He is finished the doxycycline I gave him last week. Culture I did of the wound on the right lateral leg showed a few very resistant methicillin staph aureus. This was resistant to doxycycline. Nevertheless he states the pain in the leg is a lot better which makes me wonder if the cultured organism was not really what was causing the problem nevertheless this is a very dangerous organism to be culturing out of any wound. His right leg is still a lot larger than the left. He is using an Radio broadcast assistant on this area, he blames a 4-layer compression for causing the original skin breakdown which I doubt is true however I cannot talk him out of it. We have been using silver alginate to all of these areas which were initially blisters 11/16/2018; patient is being compliant with his external compression pumps at twice a day. Miraculously he arrives in clinic today with absolutely no open wounds. He has better edema control on the left where he has been using 4 layer compression versus wound of wounds on the right and I pointed this out to him. There is no inflammation in the skin in his lower legs which is also somewhat unusual for him. There is no open wounds on the dorsal left foot. He has extremitease stockings at home  and I have asked him to bring these in next week. 11/25/18 patient's lower extremity  on examination today on the left appears for the most part to be wound free. He does have an open wound on the lateral aspect of the right lower extremity but this is minimal compared to what I've seen in past. He does request that we go ahead and wrap the left leg as well even though there's nothing open just so hopefully it will not reopen in short order. 1/28; patient has superficial open wounds on the right lateral calf left anterior calf and left posterior calf. His edema control is adequate. 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 We have been using silver alginate as the primary dressing. He claims compliance with his compression pumps 2/4; patient has superficial open wounds on numerous areas of his left calf and again one on the left dorsal foot. The areas on the right lateral calf have healed. The cellulitis that I gave him doxycycline for last week is also resolved this was mostly on the left anterior calf just below the tibial tuberosity. His edema looks fairly well-controlled. He tells me he went to see his primary doctor today and had blood work ordered 2/11; once again he has several open areas on the left calf left tibial area. Most of these are small and appear to have healthy granulation. He does not have anything open on the right. The edema and control in his thighs is pretty good which is usually a good indication he has been using his pumps as requested. 2/18; he continues to have several small areas on the left calf and left tibial area. Most of these are small healthy granulation. We put him in his stocking on the right leg last week and he arrives with a superficial open area over the right upper tibia and a fairly large area on the right lateral tibia in similar condition. His edema control actually does  not look too bad, he claims to be using his compression pumps twice a day 2/25. Continued small areas on the left calf and left tibial area. New areas especially on the right are identified just below the tibial tuberosity and on the right upper tibia itself. There are also areas of weeping edema fluid even without an obvious wound. He does not have a considerable degree of lymphedema but clearly there is more edema here than his skin can handle. He states he is using the pumps twice a day. We have an Unna boot on the right and 4 layer compression on the left. 3/3; he continues to have an area on the right lateral calf and right posterior calf just below the popliteal fossa. There is a fair amount of tenderness around the wound on the popliteal fossa but I did not see any evidence of cellulitis, could just be that the wrap came down and rubbed in this area. ooHe does not have an open area on the left leg however there is an area on the left dorsal foot at the base of the third toe ooWe have been using silver alginate to all wound areas 3/10; he did not have an open area on his left leg last time he was here a week ago. T oday he arrives with a horizontal wound just below the tibial tuberosity and an area on the left lateral calf. He has intense erythema and tenderness in this area. The area is on the right lateral calf and right posterior calf better than  last week. We have been using silver alginate as usual 3/18 - Patient returns with 3 small open areas on left calf, and 1 small open area on right calf, the skin looks ok with no significant erythema, he continues the UNA boot on right and 4 layer compression on left. The right lateral calf wound is closed , the right posterior is small area. we will continue silver alginate to the areas. 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 He is been tolerating the dressing changes without complication. Fortunately she has made excellent improvement in regard to the overall status of his wounds. Unfortunately every time we cease wrapping him he ends up reopening in causing more significant issues at that point. Again I'm unsure of the best direction to take although I think the lymphedema clinic may be appropriate for him. 02/03/19 on evaluation today patient appears to be doing well in regard to the wounds that we saw him for last week unfortunately he has a new area on the proximal portion of his right medial/posterior lower extremity where the wrap somewhat slowed down and caused swelling and a blister to rub and open. Unfortunately this is the only opening that he has on either leg at this point. 02/17/19 on evaluation today patient's bilateral lower extremities appear to be doing well. He still completely healed in regard to the left lower extremity. In regard to the right lower extremity the area where the wrap and slid down and caused the blister still seems to be slightly open although this is dramatically better than during the last evaluation two weeks ago. I'm very pleased with the way this stands overall. 03/03/19 on evaluation today patient appears to be doing well in regard to his right lower extremity in general although he did have a new blister open this does not appear to be showing any evidence of active infection at this time. Fortunately there's No fevers, chills, nausea, or vomiting noted at this time. Overall I feel like he is making good progress it does feel like that the right leg will we perform the [employer] seems to do with a bit better than three layer wrap on the left which slid down on him. We may switch to doing bilateral in the book wraps. 5/4; I have not seen Mr. Sando in quite some time. According to our case manager he  did not have an open wound on his left leg last week. He had 1 remaining wound on the right posterior medial calf. He arrives today with multiple openings on the left leg probably were blisters and/or wrap injuries from Unna boots. I do not think the Unna boot's will provide adequate compression on the left. I am also not clear about the frequency he is using the compression pumps. 03/17/19 on evaluation today patient appears to be doing excellent in regard to his lower extremities compared to last week's evaluation apparently. He had gotten significantly worse last week which is unfortunate. The [employer] wrap on the left did not seem to do very well for him at all and in fact it didn't control his swelling significantly enough he had an additional outbreak. Subsequently we go back to the four layer compression wrap on the left. This is good news. At least in that he is doing better  and the wound seem to be killing him. He still has not heard anything from the lymphedema clinic. 03/24/19 on evaluation today patient actually appears to be doing much better in regard to his bilateral lower Trinity as compared to last week when I saw him. Fortunately there's no signs of active infection at this time. He has been tolerating the dressing changes without complication. Overall I'm extremely pleased with the progress and appearance in general. 04/07/19 on evaluation today patient appears to be doing well in regard to his bilateral lower extremities. His swelling is significantly down from where it was previous. With that being said he does have a couple blisters still open at this point but fortunately nothing that seems to be too severe and again the majority of the larger openings has healed at this time. 04/14/19 on evaluation today patient actually appears to be doing quite well in regard to his bilateral lower extremities in fact I'm not even sure there's anything significantly open at this time at any site.  Nonetheless he did have some trouble with these wraps where they are somewhat irritating him secondary to the fact that he has noted that the graph wasn't too close down to the end of this foot in a little bit short as well up to his knee. Otherwise things seem to be doing quite well. 04/21/19 upon evaluation today patient's wound bed actually showed evidence of being completely healed in regard to both lower extremities which is excellent news. There does not appear to be any signs of active infection which is also good news. I'm very pleased in this regard. No fevers, chills, nausea, or vomiting noted at this time. 04/28/19 on evaluation today patient appears to be doing a little bit worse in regard to both lower extremities on the left mainly due to the fact that when he went infection disease the wrap was not wrapped quite high enough he developed a blister above this. On the right he is a small open area of nothing too significant but again this is continuing to give him some trouble he has been were in the Velcro compression that he has at home. 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 He's been tolerating the dressing changes without complication. Fortunately there's no signs of active infection at this time. No fevers, chills, nausea, or vomiting noted at this time. We have been trying to get an appointment with her lymphedema clinic in [facility] but unfortunately nobody can get them on phone with not been able to even fax information over the patient likewise is not been able to get in touch with them. Overall I'm not sure exactly what's going on here with to reach out again today. 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 Still having a lot of drainage unfortunately. He tells me especially in the left but even on the right.  There's no signs of active infection which is good news we've been using so ratcheted up to this point. 05/19/19 on evaluation today patient actually appears to be doing quite well with regard to his left lower extremity which is great news. Fortunately in regard to the right lower extremity has an issues with his wrap and he subsequently did remove this from what I'm understanding. Nonetheless long story short is what he had rewrapped once he removed it subsequently had maggots underneath  this wrap whenever he came in for evaluation today. With that being said they were obviously completely cleaned away by the nursing staff. The visit today which is excellent news. However he does appear to potentially have some infection around the right ankle region where the maggots were located as well. He will likely require anabiotic therapy today. 05/26/19 on evaluation today patient actually appears to be doing much better in regard to his bilateral lower extremities. I feel like the infection is under much better control. With that being said there were maggots noted when the wrap was removed yet again today. Again this could have potentially been left over from previous although at this time there does not appear to be any signs of significant drainage there was obviously on the wrap some drainage as well this contracted gnats or otherwise. Either way I do not see anything that appears to be doing worse in my pinion and in fact I think his drainage has slowed down quite significantly likely mainly due to the fact to his infection being under better control. 06/02/2019 on evaluation today patient actually appears to be doing well with regard to his bilateral lower extremities there is no signs of active infection at this time which is great news. With that being said he does have several open areas more so on the right than the left but nonetheless these are all significantly better than previously  noted. 06/09/2019 on evaluation today patient actually appears to be doing well. His wrap stayed up and he did not cause any problems he had more drainage on the right compared to the left but overall I do not see any major issues at this time which is great news. 06/16/2019 on evaluation today patient appears to be doing excellent with regard to his lower extremities the only area that is open is a new blister that can have opened as of today on the medial ankle on the left. Other than this he really seems to be doing great I see no major issues at this point. 06/23/2019 on evaluation today patient appears to be doing quite well with regard to his bilateral lower extremities. In fact he actually appears to be almost completely healed there is a small area of weeping noted of the right lower extremity just above the ankle. Nonetheless fortunately there is no signs of active infection at this time which is good news. No fevers, chills, nausea, vomiting, or diarrhea. 8/24; the patient arrived for a nurse visit today but complained of very significant pain in the left leg and therefore I was asked to look at this. Noted that he did not have an open area on the left leg last week nevertheless this was wrapped. The patient states that he is not been able to put his compression pumps on the left leg because of the discomfort. He has not been systemically unwell 06/30/2019 on evaluation today patient unfortunately despite being excellent last week is doing much worse with regard to his left lower extremity today. In fact he had to come in for a nurse on Monday where his left leg had to be rewrapped due to excessive weeping Dr. Leanord Hawking placed him on doxycycline at that point. Fortunately there is no signs of active infection Systemically at this time which is good news. 07/07/2019 in regard to the patient's wounds today he actually seems to be doing well with his right lower extremity there really is nothing open or  draining at this point this is  great news. Unfortunately the left lower extremity is given him additional trouble at this time. There does not appear to be any signs of active infection nonetheless he does have a lot of edema and swelling noted at this point as well as blistering all of which has led to a much more poor appearing leg at this time compared to where it was 2 weeks ago when it was almost completely healed. Obviously this is a little discouraging for the patient. He is try to contact the lymphedema clinic in [place] he has not been able to get through to them. 07/14/2019 on evaluation today patient actually appears to be doing slightly better with regard to his left lower extremity ulcers. Overall I do feel like at least at the top of the wrap that we have been placing this area has healed quite nicely and looks much better. The remainder of the leg is showing signs of improvement. Unfortunately in the thigh area he still has an open region on the left and again on the right he has been utilizing just a Band-Aid on an area that also opened on the thigh. Again this is an area that were not able to wrap although we did do an Ace wrap to provide some compression that something that obviously is a little less effective than the compression wraps we have been using on the lower portion of the leg. He does have an appointment with the lymphedema clinic in [facility] on Friday. 07/21/2019 on evaluation today patient appears to be doing better with regard to his lower extremity ulcers. He has been tolerating the dressing changes without complication. Fortunately there is no signs of active infection at this time. No fevers, chills, nausea, vomiting, or diarrhea. I did receive the paperwork from the physical therapist at the lymphedema clinic in [place]. Subsequently I signed off on that this morning and sent that back to him for further progression with the treatment plan. 07/28/2019 on  evaluation today patient appears to be doing very well with regard to his right lower extremity where I do not see any open wounds at this point. Fortunately he is feeling great as far as that is concerned as well. In regard to the left lower extremity he has been having issues with still several areas of weeping and edema although the upper leg is doing better his lower leg still I think is going require the compression wrap at this time. No fevers, chills, nausea, vomiting, or diarrhea. 08/04/2019 on evaluation today patient unfortunately is having new wounds on the right lower extremity. Again we have been using Unna boot wrap on that side. We switched him to using his juxta lite wrap at home. With that being said he tells me he has been using it although his legs extremely swollen and to be honest really does not appear that he has been. I cannot know that for sure however. Nonetheless he has multiple new wounds on the right lower extremity at this time. Obviously we will have to see about getting this rewrapped for him today. 08/11/2019 on evaluation today patient appears to be doing fairly well with regard to his wounds. He has been tolerating the dressing changes including the compression wraps without complication. He still has a lot of edema in his upper thigh regions bilaterally he is supposed to be seeing the lymphedema clinic on the 15th of this month once his wraps arrive for the upper part of his legs. 08/18/2019 on evaluation today patient appears  to be doing well with regard to his bilateral lower extremities at this point. He has been tolerating the dressing changes without complication. Fortunately there is no signs of active infection which is also good news. He does have a couple weeping areas on the first and second toe of the right foot he also has just a small area on the left foot upper leg and a small area on the left lower leg but overall he is doing quite well in my opinion. He  is supposed to be getting his wraps shortly in fact tomorrow and then subsequently is seeing the lymphedema clinic next Wednesday on the 21st. Of note he is also leaving on the 25th to go on vacation for a week to the beach. For that reason and since there is some uncertainty about what there can be doing at lymphedema clinic next Wednesday I am get a make an appointment for next Friday here for [place] to see what we need to do for him prior to him leaving for vacation. 10/23; patient arrives in considerable pain predominantly in the upper posterior calf just distal to the popliteal fossa also in the wound anteriorly above the major wound. This is probably cellulitis and he has had this recurrently in the past. He has no open wound on the right side and he has had an Radio broadcast assistant in that area. Finally I note that he has an area on the left posterior calf which by enlarge is mostly epithelialized. This protrudes beyond the borders of the surrounding skin in the setting of dry scaly skin and lymphedema. The patient is leaving for [facility] on Sunday. Per his longstanding pattern, he will not take his compression pumps with him predominantly out of fear that they will be stolen. He therefore asked that we put a Unna boot back on the right leg. He will also contact the wound care center in [facility] to see if they can change his dressing in the mid week. 11/3; patient returned from his vacation to [facility]. He was seen on 1 occasion at their wound care center. They did a 2 layer compression system as they did not have our 4-layer wrap. I am not completely certain what they put on the wounds. They did not change the Unna boot on the right. The patient is also seeing a lymphedema specialist physical therapist in [place]. It appears that he has some compression sleeve for his thighs which indeed look quite a bit better than I am used to seeing. He pumps over these with his external compression  pumps. 11/10; the patient has a new wound on the right medial thigh otherwise there is no open areas on the right. He has an area on the left leg posteriorly anteriorly and medially and an area over the left second toe. We have been using silver alginate. He thinks the injury on his thigh is secondary to friction from the compression sleeve he has. 11/17; the patient has a new wound on the right medial thigh last week. He thinks this is because he did not have a underlying stocking for his thigh juxta lite apparatus. He now has this. The area is fairly large and somewhat angry but I do not think he has underlying cellulitis. ooHe has a intact blister on the right anterior tibial area. ooSmall wound on the right great toe dorsally ooSmall area on the medial left calf. 11/30; the patient does not have any open areas on his right leg  and we did not take his juxta lite stocking off. However he states that on Friday his compression wrap fell down lodging around his upper mid calf area. As usual this creates a lot of problems for him. He called urgently today to be seen for a nurse visit however the nurse visit turned into a provider visit because of extreme erythema and pain in the left anterior tibia extending laterally and posteriorly. The area that is problematic is extensive 10/06/2019 upon evaluation today patient actually appears to be doing poorly in regard to his left lower extremity. He Dr. Leanord Hawking did place him on doxycycline this past Monday apparently due to the fact that he was doing much worse in regard to this left leg. Fortunately the doxycycline does seem to be helping. Unfortunately we are still having a very difficult time getting his edema under any type of control in order to anticipate discharge at some point. The only way were really able to control his lymphedema really is with compression wraps and that has only even seemingly temporary. He has been seeing a lymphedema clinic  they are trying to help in this regard but still this has been somewhat frustrating in general for the patient. 10/13/19 on evaluation today patient appears to be doing excellent with regard to his right lower extremity as far as the wounds are concerned. His swelling is still quite extensive unfortunately. He is still having a lot of drainage from the thigh areas bilaterally which is unfortunate. He's been going to lymphedema clinic but again he still really does not have this edema under control as far as his lower extremities are concern. With regard to his left lower extremity this seems to be improving and I do believe the doxycycline has been of benefit for him. He is about to complete the doxycycline. 10/20/2019 on evaluation today patient appears to be doing poorly in regard to his bilateral lower extremities. More in the right thigh he has a lot of irritation at this site unfortunately. In regard to the left lower extremity the wrap was not quite as high it appears and does seem to have caused him some trouble as well. Fortunately there is no evidence of systemic infection though he does have some blue-green drainage which has me concerned for the possibility of Pseudomonas. He tells me he is previously taking Cipro without complications and he really does not care for Levaquin however due to some of the side effects he has. He is not allergic to any medications specifically antibiotics that were aware of. 10/27/2019 on evaluation today patient actually does appear to be for the most part doing better when compared to last week's evaluation. With that being said he still has multiple open wounds over the bilateral lower extremities. He actually forgot to start taking the Cipro and states that he still has the whole bottle. He does have several new blisters on left lower extremity today I think I would recommend he go ahead and take the Cipro based on what I am seeing at  this point. 12/30-Patient comes at 1 week visit, 4 layer compression wraps on the left and Unna boot on the right, primary dressing Xtrasorb and silver alginate. Patient is taking his Cipro and has a few more days left probably 5-6, and the legs are doing better. He states he is using his compressions devices which I believe he has 11/10/2019 on evaluation today patient actually appears to be much better than last time I saw him 2  weeks ago. His wounds are significantly improved and overall I am very pleased in this regard. Fortunately there is no signs of active infection at this time. He is just a couple of days away from completing Cipro. Overall his edema is much better he has been using his lymphedema pumps which I think is also helping at this point. 11/17/2019 on evaluation today patient appears to be doing excellent in regard to his wounds in general. His legs are swollen but not nearly as much as they have been in the past. Fortunately he is tolerating the compression wraps without complication. No fevers, chills, nausea, vomiting, or diarrhea. He does have some erythema however in the distal portion of his right lower extremity specifically around the forefoot and toes there is a little bit of warmth here as well. 11/24/2019 on evaluation today patient appears to be doing well with regard to his right lower extremity I really do not see any open wounds at this point. His left lower extremity does have several open areas and his right medial thigh also is open. Other than this however overall the patient seems to be making good progress and I am very pleased at this point. 12/01/2019 on evaluation today patient appears to be doing poorly at this point in regard to his left lower extremity has several new blisters despite the fact that we have him in compression wraps. In fact he had a 4-layer compression wrap, his upper thigh wrapped from lymphedema clinic, and a juxta light over top of the 4  layer compression wrap the lymphedema clinic applied and despite all this he still develop blisters underneath. Obviously this does have me concerned about the fact that unfortunately despite what we are doing to try to get wounds healed he continues to have new areas arise I do not think he is ever good to be at the point where he can realistically just use wraps at home to keep things under control. Typically when we heal him it takes about 1-2 days before he is back in the clinic with severe breakdown and blistering of his lower extremities bilaterally. This is happened numerous times in the past. Unfortunately I think that we may need some help as far as overall fluid overload to kind of limit what we are seeing and get things under better control. 12/08/2019 on evaluation today patient presents for follow-up concerning his ongoing bilateral lower extremity edema. Unfortunately he is still having quite a bit of swelling the compression wraps are controlling this to some degree but he did see Dr. [name] his cardiologist I do have that available for review today as far as the appointment was concerned that was on 12/06/2019. Obviously that she has been 2 days ago. The patient states that he is only been taking the Lasix 80 mg 1 time a day he had told me previously he was taking this twice a day. Nonetheless Dr. [name] recommended this be up to 80 mg 2 times a day for the patient as he did appear to be fluid overloaded. With that being said the patient states he did this yesterday and he was unable to go anywhere or do anything due to the fact that he was constantly having to urinate. Nonetheless I think that this is still good to be something that is important for him as far as trying to get his edema under control at all things that he is going to be able to just expect his wounds to get under  control and things to be better without going through at least a period of time where he is trying to stabilize his  fluid management in general and I think increasing the Lasix is likely the first step here. It was also mentioned the possibility that the patient may require metolazone. With that being said he wanted to have the patient take Lasix twice a day first and then reevaluating 2 months to see where things stand. 12/15/2019 upon evaluation today patient appears to be doing regard to his legs although his toes are showing some signs of weeping especially on the left at this point to some degree on the right. There does not appear to be any signs of active infection and overall I do feel like the compression wraps are doing well for him but he has not been able to take the Lasix at home and the increased dose that Dr. [name] recommended. He tells me that just not go to be feasible for him. Nonetheless I think in this case he should probably send a message to Dr. [name] in order to discuss options from the standpoint of possible admission to get the fluid off or otherwise going forward. 12/22/2019 upon evaluation today patient appears to be doing fairly well with regard to his lower extremities at this point. In fact he would be doing excellent if it was not for the fact that his right anterior thigh apparently had an allergic reaction to adhesive tape that he used. The wound itself that we have been monitoring actually appears to be healed. There is a lot of irritation at this point. 12/29/2019 upon evaluation today patient appears to be doing well in regard to his lower extremities. His left medial thigh is open and somewhat draining today but this is the only region that is open the right has done much better with the treatment utilizing the steroid cream that I prescribed for him last week. Overall I am pleased in that regard. Fortunately there is no signs of active infection at this time. No fevers, chills, nausea, vomiting, or diarrhea. 01/05/2020 upon evaluation today patient appears to be doing more poorly in  regard to his right lower extremity at this point upon evaluation today. Unfortunately he continues to have issues in this regard and I think the biggest issue is controlling his edema. This obviously is not very well controlled at this point is been recommended that he use the Lasix twice a day but he has not been able to do that. Unfortunately I think this is leading to an issue where honestly he is not really able to effectively control his edema and therefore the wounds really are not doing significantly better. I do not think that he is going to be able to keep things under good control unless he is able to control his edema much better. I discussed this again in great detail with him today. 01/12/2020 good news is patient actually appears to be doing quite well today at this point. He does have an appointment with lymphedema clinic tomorrow. His legs appear healed and the toe on the left is almost completely healed. In general I am very pleased with how things stand at this point. 01/19/2020 upon evaluation today patient appears to actually be doing well in regard to his lower extremities there is nothing open at this point. Fortunately he has done extremely well more recently. Has been seeing lymphedema clinic as well. With that being said he has Velcro wraps for his lower  legs as well as his upper legs. The only wound really is on his toe which is the right great toe and this is barely anything even there. With all that being said I think it is good to be appropriate today to go ahead and switch him over to the Velcro compression wraps. 01/26/2020 upon evaluation today patient appears to be doing worse with regard to his lower extremities after last week switch him to Velcro compression wraps. Unfortunately he lasted less than 24 hours he did not have the sock portion of his Velcro wrap on the left leg and subsequently developed a blister underneath the Velcro portion. Obviously this is not good  and not what we were looking for at this point. He states the lymphedema clinic did tell him to wear the wrap for 23 hours and take him off for 1 I am okay with that plan but again right now we got a get things back under control again he may have some cellulitis noted as well. 02/02/2020 upon evaluation today patient unfortunately appears to have several areas of blistering on his bilateral lower extremities today mainly on the feet. His legs do seem to be doing somewhat better which is good news. Fortunately there is no evidence of active infection at this time. No fevers, chills, nausea, vomiting, or diarrhea. 02/16/2020 upon evaluation today patient appears to be doing well at this time with regard to his legs. He has a couple weeping areas on his toes but for the most part everything is doing better and does appear to be sealed up on his legs which is excellent news. We can continue with wrapping him at this point as he had every time we discontinue the wraps he just breaks out with new wounds. There is really no point in is going forward with this at this point. 03/08/2020 upon evaluation today patient actually appears to be doing quite well with regard to his lower extremity ulcers. He has just a very superficial and really almost nonexistent blister on the left lower extremity he has in general done very well with the compression wraps. With that being said I do not see any signs of infection at this time which is good news. 03/29/2020 upon evaluation today patient appears to be doing well with regard to his wounds currently except for where he had several new areas that opened up due to some of the wrap slipping and causing him trouble. He states he did not realize they had slipped. Nonetheless he has a 1 area on the right and 3 new areas on the left. Fortunately there is no signs of active infection at this time which is great news. 04/05/2020 upon evaluation today patient actually appears to be  doing quite well in general in regard to his legs currently. Fortunately there is no signs of active infection at this time. No fevers, chills, nausea, vomiting, or diarrhea. He tells me next week that he will actually be seen in the lymphedema clinic on Thursday at 10 AM I see him on Wednesday next week. 04/12/2020 upon evaluation today patient appears to be doing very well with regard to his lower extremities bilaterally. In fact he does not appear to have any open wounds at this point which is good news. Fortunately there is no signs of active infection at this time. No fevers, chills, nausea, vomiting, or diarrhea. 04/19/2020 upon evaluation today patient appears to be doing well with regard to his wounds currently on the bilateral lower  extremities. There does not appear to be any signs of active infection at this time. Fortunately there is no evidence of systemic infection and overall very pleased at this point. Nonetheless after I held him out last week he literally had blisters the next morning already which swelled up with him being right back here in the clinic. Overall I think that he is just not can be able to be discharged with his legs the way they are he is much to volume overloaded as far as fluid is concerned and that was discussed with him today of also discussed this but should try the clinic nurse manager as well as Dr. Leanord Hawking. 04/26/2020 upon evaluation today patient appears to be doing better with regard to his wounds currently. He is making some progress and overall swelling is under good control with the compression wraps. Fortunately there is no evidence of active infection at this time. 05/10/2020 on evaluation today patient appears to be doing overall well in regard to his lower extremities bilaterally. He is Tolerating the compression wraps without complication and with what we are seeing currently I feel like that he is making excellent progress. There is no signs of active  infection at this time. 05/24/2020 upon evaluation today patient appears to be doing well in regard to his legs. The swelling is actually quite a bit down compared to where it has been in the past. Fortunately there is no sign of active infection at this time which is also good news. With that being said he does have several wounds on his toes that have opened up at this point. 05/31/2020 upon evaluation today patient appears to be doing well with regard to his legs bilaterally where he really has no significant fluid buildup at this point overall he seems to be doing quite well. Very pleased in this regard. With regard to his toes these also seem to be drying up which is excellent. We have continue to wrap him as every time we tried as a transition to the juxta light wraps things just do not seem to get any better. 06/07/2020 upon evaluation today patient appears to be doing well with regard to his right leg at this point. Unfortunately left leg has a lot of blistering he tells me the wrap started to slide down on him when he tried to put his other Velcro wrap over top of it to help keep things in order but nonetheless still had some issues. 06/14/2020 on evaluation today patient appears to be doing well with regard to his lower extremity ulcers and foot ulcers at this point. I feel like everything is actually showing signs of improvement which is great news overall there is no signs of active infection at this time. No fevers, chills, nausea, vomiting, or diarrhea. Objective Constitutional Obese and well-hydrated in no acute distress. Vitals Time Taken: 10:13 AM, Height: 70 in, Weight: 380.2 lbs, BMI: 54.5, Temperature: 98.1 F, Pulse: 89 bpm, Respiratory Rate: 20 breaths/min, Blood Pressure: 172/49 mmHg, Capillary Blood Glucose: 184 mg/dl. General Notes: glucose per pt report Respiratory normal breathing without difficulty. Psychiatric this patient is able to make decisions and demonstrates good  insight into disease process. Alert and Oriented x 3. pleasant and cooperative. General Notes: Patient's wound bed again showed signs of improvement at all locations on the left lower extremity and foot overall I feel like he is making good progress back to getting everything under good control. There is no signs of infection currently. Integumentary (Hair, Skin)  Wound #177 status is Open. Original cause of wound was Blister. The wound is located on the Right T Great. The wound measures 2cm length x 1.8cm width oe x 0.1cm depth; 2.827cm^2 area and 0.283cm^3 volume. There is Fat Layer (Subcutaneous Tissue) Exposed exposed. There is no tunneling or undermining noted. There is a medium amount of serous drainage noted. The wound margin is flat and intact. There is large (67-100%) pink granulation within the wound bed. There is no necrotic tissue within the wound bed. Wound #178 status is Open. Original cause of wound was Gradually Appeared. The wound is located on the Right T Second. The wound measures 1.2cm oe length x 0.8cm width x 0.1cm depth; 0.754cm^2 area and 0.075cm^3 volume. There is Fat Layer (Subcutaneous Tissue) Exposed exposed. There is no tunneling or undermining noted. There is a small amount of serous drainage noted. The wound margin is flat and intact. There is large (67-100%) pink granulation within the wound bed. There is no necrotic tissue within the wound bed. Wound #180 status is Open. Original cause of wound was Blister. The wound is located on the Left,Proximal,Dorsal Foot. The wound measures 3cm length x 1.5cm width x 0.1cm depth; 3.534cm^2 area and 0.353cm^3 volume. There is no tunneling or undermining noted. There is a medium amount of serous drainage noted. The wound margin is indistinct and nonvisible. There is large (67-100%) pink, pale granulation within the wound bed. There is no necrotic tissue within the wound bed. Wound #181 status is Open. Original cause of wound was  Blister. The wound is located on the Left T Second. The wound measures 1.1cm length x 0.7cm oe width x 0.1cm depth; 0.605cm^2 area and 0.06cm^3 volume. There is no tunneling or undermining noted. There is a small amount of serous drainage noted. The wound margin is flat and intact. There is large (67-100%) pink, pale granulation within the wound bed. There is no necrotic tissue within the wound bed. Wound #182 status is Open. Original cause of wound was Gradually Appeared. The wound is located on the Left,Lateral Lower Leg. The wound measures 1.5cm length x 1.2cm width x 0.1cm depth; 1.414cm^2 area and 0.141cm^3 volume. There is no tunneling or undermining noted. There is a small amount of serous drainage noted. The wound margin is flat and intact. There is large (67-100%) pink, pale granulation within the wound bed. There is no necrotic tissue within the wound bed. 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 There was a Four Layer Compression Therapy Procedure by Yevonne Pax, RN. Post procedure Diagnosis Wound #182: Same as Pre-Procedure There was a Radio broadcast assistant Compression Therapy Procedure by Yevonne Pax, RN. Post procedure Diagnosis Wound #: Same as Pre-Procedure Plan Follow-up Appointments: Return Appointment in 1 week. Dressing Change Frequency: Do not change entire dressing for one week. - both legs Skin Barriers/Peri-Wound Care: Moisturizing lotion - both legs  and feet and toes Wound Cleansing: May shower with protection. Primary Wound Dressing: Wound #177 Right T Great: oe Calcium Alginate with Silver Wound #182 Left,Lateral Lower Leg: Calcium Alginate with Silver  Wound #178 Right T Second: oe Calcium Alginate with Silver Wound #180 Left,Proximal,Dorsal Foot: Calcium Alginate with Silver Wound #181 Left T Second: oe Calcium Alginate with Silver Secondary Dressing: Wound #177 Right T Great: oe Kerlix/Rolled Gauze Dry Gauze Wound #178 Right T Second: oe Kerlix/Rolled Gauze Dry Gauze Wound #180 Left,Proximal,Dorsal Foot: Dry Gauze Wound #182 Left,Lateral Lower Leg: Dry Gauze Wound #181 Left T Second: oe Kerlix/Rolled Gauze Dry Gauze Edema Control: 4 layer compression: Left lower extremity Unna Boot to Right Lower Extremity Avoid standing for long periods of time Elevate legs to the level of the heart or above for 30 minutes daily and/or when sitting, a frequency of: - throughout the day Exercise regularly 1. I would recommend at this point that we go ahead and continue with the wound care measures as before with regard to the silver alginate to the open/draining areas. 2. We will also continue with the compression wraps as we have before. The patient is utilizing a 4-layer on the left lower extremity and a Unna boot wrap on the right lower extremity. 3. Also can recommend the patient continue to elevate his legs much as possible that is of utmost importance as well. We will see patient back for reevaluation in 1 week here in the clinic. If anything worsens or changes patient will contact our office for additional recommendations. Electronic Signature(s) Signed: 06/14/2020 6:14:05 PM By: Zenaida Deed RN, BSN Signed: 06/15/2020 10:06:45 AM By: Lenda Kelp PA-C Previous Signature: 06/14/2020 11:01:35 AM Version By: Lenda Kelp PA-C Entered By: Zenaida Deed on 06/14/2020  11:02:54 -------------------------------------------------------------------------------- SuperBill Details Patient Name: Date of Service: [facility], [place]. 06/14/2020 Medical Record Number: [id number] Patient Account Number: [id number] Date of Birth/Sex: Treating RN: 1951/01/12 (69 y.o. [name] Primary Care Provider: Marney Setting, [name] Other Clinician: Referring Provider: Treating Provider/Extender: Lenda Kelp [facility] [name], [name] in Treatment: 229 Diagnosis Coding ICD-10 Codes Code Description [phone] Non-pressure chronic ulcer of right calf limited to breakdown of skin L97.221 Non-pressure chronic ulcer of left calf limited to breakdown of skin 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 of bilateral lower extremity I89.0 Lymphedema, not elsewhere classified E11.622 Type 2 diabetes mellitus with other skin ulcer 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, unspecified L03.116 Cellulitis of left lower limb Facility Procedures CPT4 Code: 42595638 Description: (Facility Use Only) 29581LT - APPLY MULTLAY COMPRS LWR LT LEG Modifier: 59 Quantity: 1 CPT4 Code: 75643329 Description: (Facility Use Only) 51884ZY - APPLY UNNA BOOT RT Modifier: Quantity: 1 Physician Procedures : CPT4 Code Description Modifier [id number] - WC PHYS LEVEL 3 - EST PT ICD-10 Diagnosis Description 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 skin L97.221 Non-pressure chronic ulcer of left calf limited to breakdown of  skin 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 of bilateral lower extremity Quantity: 1 Electronic Signature(s) Signed: 06/14/2020 6:14:05 PM By: Zenaida Deed RN, BSN Signed: 06/15/2020 10:06:45 AM By: Lenda Kelp PA-C Previous Signature: 06/14/2020 11:01:53 AM Version By: Lenda Kelp PA-C Entered By: Zenaida Deed on 06/14/2020 11:03:18

## 2020-06-16 NOTE — Progress Notes (Signed)
AQEEL, NORGAARD ([id number]) Visit Report for 06/14/2020 Arrival Information Details Patient Name: Date of Service: CO ZACHORY, MANGUAL 06/14/2020 10:15 A M Medical Record Number: [id number] Patient Account Number: [id number] Date of Birth/Sex: Treating RN: 15-Jul-1951 (69 y.o. [name] Primary Care Ceasia Elwell: [name], [name] Other Clinician: Referring Giavonni Fonder: Treating Tracee Mccreery/Extender: Worthy Keeler [facility] [name], [name] in Treatment: 36 Visit Information History Since Last Visit Added or deleted any medications: No Patient Arrived: Walker Any new allergies or adverse reactions: No Arrival Time: 10:13 Had a fall or experienced change in No Accompanied By: alone activities of daily living that may affect Transfer Assistance: None risk of falls: Patient Identification Verified: Yes Signs or symptoms of abuse/neglect since last visito No Secondary Verification Process Completed: Yes Hospitalized since last visit: No Patient Requires Transmission-Based Precautions: No Implantable device outside of the clinic excluding No Patient Has Alerts: Yes cellular tissue based products placed in the center since last visit: Has Dressing in Place as Prescribed: Yes Has Compression in Place as Prescribed: Yes Pain Present Now: No Electronic Signature(s) Signed: 06/16/2020 5:19:54 PM By: Levan Hurst RN, BSN Entered By: Levan Hurst on 06/14/2020 10:13:49 -------------------------------------------------------------------------------- Compression Therapy Details Patient Name: Date of Service: [name], Alim J. 06/14/2020 10:15 A M Medical Record Number: [id number] Patient Account Number: [id number] Date of Birth/Sex: Treating RN: 05/10/1951 (69 y.o. [name] Primary Care Melrose Kearse: [name], [name] Other Clinician: Referring Alonnie Bieker: Treating Rayvin Abid/Extender: Worthy Keeler [facility] [name], [name] in Treatment: 229 Compression Therapy Performed for Wound  Assessment: NonWound Condition Lymphedema - Right Leg Performed By: Clinician [name], RN Compression Type: [name] Post Procedure Diagnosis Same as Pre-procedure Electronic Signature(s) Signed: 06/14/2020 6:14:05 PM By: Baruch Gouty RN, BSN Entered By: Baruch Gouty on 06/14/2020 10:59:52 -------------------------------------------------------------------------------- Compression Therapy Details Patient Name: Date of Service: [name], Dane J. 06/14/2020 10:15 A M Medical Record Number: [id number] Patient Account Number: [id number] Date of Birth/Sex: Treating RN: [birth year] (69 y.o. [name] Primary Care Pierrette Scheu: [name], [name] Other Clinician: Referring Devin Foskey: Treating Clifton Safley/Extender: Worthy Keeler [facility] [name], [name] in Treatment: 229 Compression Therapy Performed for Wound Assessment: Wound #182 Left,Lateral Lower Leg Performed By: Clinician [name], RN Compression Type: Four Layer Post Procedure Diagnosis Same as Pre-procedure Electronic Signature(s) Signed: 06/14/2020 6:14:05 PM By: Baruch Gouty RN, BSN Entered By: Baruch Gouty on 06/14/2020 11:00:20 -------------------------------------------------------------------------------- Encounter Discharge Information Details Patient Name: Date of Service: [facility], Amari J. 06/14/2020 10:15 A M Medical Record Number: [id number] Patient Account Number: [id number] Date of Birth/Sex: Treating RN: 10-Dec-1950 (69 y.o. Oval Linsey Primary Care Gissela Bloch: [name], [name] Other Clinician: Referring Axiel Fjeld: Treating Noa Constante/Extender: Worthy Keeler [facility] [name], [name] in Treatment: 229 Encounter Discharge Information Items Discharge Condition: Stable Ambulatory Status: Walker Discharge Destination: Home Transportation: Private Auto Accompanied By: self Schedule Follow-up Appointment: Yes Clinical Summary of Care: Patient Declined Electronic Signature(s) Signed: 06/15/2020  5:07:35 PM By: [name] RN Entered By: [name] on 06/14/2020 13:13:40 -------------------------------------------------------------------------------- Lower Extremity Assessment Details Patient Name: Date of Service: CO JHONNY, CALIXTO 06/14/2020 10:15 A M Medical Record Number: [id number] Patient Account Number: [id number] Date of Birth/Sex: Treating RN: 01-29-1951 (69 y.o. [name] Primary Care Khelani Kops: [name], [name] Other Clinician: Referring Andalyn Heckstall: Treating Zyonna Vardaman/Extender: Worthy Keeler [facility] [name], [name] in Treatment: 229 Edema Assessment Assessed: [Left: No] [Right: No] Edema: [Left: Yes] [Right: Yes] Calf Left: Right: Point of Measurement: 25 cm From Medial Instep 38 cm 42.5  cm Ankle Left: Right: Point of Measurement: 9 cm From Medial Instep 25.8 cm 28 cm Vascular Assessment Pulses: Dorsalis Pedis Palpable: [Left:Yes] [Right:Yes] Electronic Signature(s) Signed: 06/16/2020 5:19:54 PM By: Levan Hurst RN, BSN Entered By: Levan Hurst on 06/14/2020 10:25:14 -------------------------------------------------------------------------------- Wing Details Patient Name: Date of Service: [facility], Odies J. 06/14/2020 10:15 A M Medical Record Number: [id number] Patient Account Number: [id number] Date of Birth/Sex: Treating RN: 12-31-1950 (69 y.o. [name] Primary Care Myer Bohlman: [name], [name] Other Clinician: Referring Lashaunda Schild: Treating Ciela Mahajan/Extender: Worthy Keeler [facility] [name], [name] in Treatment: 229 Active Inactive Venous Leg Ulcer Nursing Diagnoses: Actual venous Insuffiency (use after diagnosis is confirmed) Goals: Patient will maintain optimal edema control Date Initiated: 09/10/2016 Target Resolution Date: 06/28/2020 Goal Status: Active Verify adequate tissue perfusion prior to therapeutic compression application Date Initiated: 09/10/2016 Date Inactivated: 11/28/2016 Goal Status:  Met Interventions: Assess peripheral edema status every visit. Compression as ordered Provide education on venous insufficiency Notes: edema not contolled above wraps, pt not using lymoh pumps regularly Electronic Signature(s) Signed: 06/14/2020 6:14:05 PM By: Baruch Gouty RN, BSN Entered By: Baruch Gouty on 06/14/2020 10:58:38 -------------------------------------------------------------------------------- Pain Assessment Details Patient Name: Date of Service: [name], Kenaz J. 06/14/2020 10:15 A M Medical Record Number: [id number] Patient Account Number: [id number] Date of Birth/Sex: Treating RN: [birth year] (69 y.o. [name] Primary Care Samadhi Mahurin: [name], [name] Other Clinician: Referring Rossana Molchan: Treating Johnchristopher Sarvis/Extender: Worthy Keeler [facility] [name], [name] in Treatment: 229 Active Problems Location of Pain Severity and Description of Pain Patient Has Paino No Site Locations Pain Management and Medication Current Pain Management: Electronic Signature(s) Signed: 06/16/2020 5:19:54 PM By: Levan Hurst RN, BSN Entered By: Levan Hurst on 06/14/2020 10:14:41 -------------------------------------------------------------------------------- Patient/Caregiver Education Details Patient Name: Date of Service: [name], [name] 8/11/2021andnbsp10:15 A M Medical Record Number: [id number] Patient Account Number: [id number] Date of Birth/Gender: Treating RN: 20-Jul-1951 (69 y.o. [name] Primary Care Physician: [name], [name] Other Clinician: Referring Physician: Treating Physician/Extender: Criss Rosales, [name] in Treatment: 229 Education Assessment Education Provided To: Patient Education Topics Provided Venous: Methods: Explain/Verbal Responses: Reinforcements needed, State content correctly Electronic Signature(s) Signed: 06/14/2020 6:14:05 PM By: Baruch Gouty RN, BSN Entered By: Baruch Gouty on 06/14/2020  10:59:03 -------------------------------------------------------------------------------- Wound Assessment Details Patient Name: Date of Service: [name], Jarel J. 06/14/2020 10:15 A M Medical Record Number: [id number] Patient Account Number: [id number] Date of Birth/Sex: Treating RN: [birth year] (69 y.o. [name] Primary Care Kensley Valladares: [name], [name] Other Clinician: Referring Kimesha Claxton: Treating Yashira Offenberger/Extender: Worthy Keeler [facility] [name], [name] in Treatment: 229 Wound Status Wound Number: [id number] Primary Lymphedema Etiology: Wound Location: Right T Great oe Wound Open Wounding Event: Blister Status: Date Acquired: 05/24/2020 Comorbid Chronic sinus problems/congestion, Arrhythmia, Hypertension, Weeks Of Treatment: 3 History: Peripheral Arterial Disease, Type II Diabetes, History of Burn, Clustered Wound: No Gout, Confinement Anxiety Photos Photo Uploaded By: Mikeal Hawthorne on 06/15/2020 10:52:40 Wound Measurements Length: (cm) 2 Width: (cm) 1.8 Depth: (cm) 0.1 Area: (cm) 2.827 Volume: (cm) 0.283 % Reduction in Area: -1700.6% % Reduction in Volume: -1668.7% Epithelialization: Small (1-33%) Tunneling: No Undermining: No Wound Description Classification: Full Thickness Without Exposed Support Structures Wound Margin: Flat and Intact Exudate Amount: Medium Exudate Type: Serous Exudate Color: amber Foul Odor After Cleansing: No Slough/Fibrino No Wound Bed Granulation Amount: Large (67-100%) Exposed Structure Granulation Quality: Pink Fascia Exposed: No Necrotic Amount: None Present (0%) Fat Layer (Subcutaneous Tissue) Exposed: Yes Tendon Exposed: No Muscle Exposed:  No Joint Exposed: No Bone Exposed: No Treatment Notes Wound #177 (Right Toe Great) 1. Cleanse With Wound Cleanser Soap and water 3. Primary Dressing Applied Calcium Alginate Ag 4. Secondary Dressing Dry Gauze 6. Support Layer Applied Kerlix/Coban Retail banker) Signed: 06/16/2020 5:19:54 PM By: Levan Hurst RN, BSN Entered By: Levan Hurst on 06/14/2020 10:34:37 -------------------------------------------------------------------------------- Wound Assessment Details Patient Name: Date of Service: [name], Schneur J. 06/14/2020 10:15 A M Medical Record Number: [id number] Patient Account Number: [id number] Date of Birth/Sex: Treating RN: Feb 05, 1951 (69 y.o. [name] Primary Care Siniya Lichty: [name], [name] Other Clinician: Referring Caellum Mancil: Treating Kaheem Halleck/Extender: Worthy Keeler MCG[facility] [name], [name] in Treatment: 229 Wound Status Wound Number: [id number] Primary Lymphedema Etiology: Wound Location: Right T Second oe Wound Open Wounding Event: Gradually Appeared Status: Date Acquired: 05/24/2020 Comorbid Chronic sinus problems/congestion, Arrhythmia, Hypertension, Weeks Of Treatment: 3 History: Peripheral Arterial Disease, Type II Diabetes, History of Burn, Clustered Wound: No Gout, Confinement Anxiety Photos Photo Uploaded By: Mikeal Hawthorne on 06/15/2020 10:52:41 Wound Measurements Length: (cm) 1.2 Width: (cm) 0.8 Depth: (cm) 0.1 Area: (cm) 0.754 Volume: (cm) 0.075 % Reduction in Area: -380.3% % Reduction in Volume: -368.7% Epithelialization: Medium (34-66%) Tunneling: No Undermining: No Wound Description Classification: Full Thickness Without Exposed Support Structures Wound Margin: Flat and Intact Exudate Amount: Small Exudate Type: Serous Exudate Color: amber Foul Odor After Cleansing: No Slough/Fibrino No Wound Bed Granulation Amount: Large (67-100%) Exposed Structure Granulation Quality: Pink Fascia Exposed: No Necrotic Amount: None Present (0%) Fat Layer (Subcutaneous Tissue) Exposed: Yes Tendon Exposed: No Muscle Exposed: No Joint Exposed: No Bone Exposed: No Treatment Notes Wound #178 (Right Toe Second) 1. Cleanse With Wound Cleanser Soap and water 3. Primary Dressing  Applied Calcium Alginate Ag 4. Secondary Dressing Dry Gauze 6. Support Layer Applied Kerlix/Coban Product manager) Signed: 06/16/2020 5:19:54 PM By: Levan Hurst RN, BSN Entered By: Levan Hurst on 06/14/2020 10:35:07 -------------------------------------------------------------------------------- Wound Assessment Details Patient Name: Date of Service: [name], Ewel J. 06/14/2020 10:15 A M Medical Record Number: [id number] Patient Account Number: [id number] Date of Birth/Sex: Treating RN: [birth year] (69 y.o. [name] Primary Care Kerrington Greenhalgh: [name], [name] Other Clinician: Referring Abron Neddo: Treating Deitrick Ferreri/Extender: Worthy Keeler MCGO [name], [name] in Treatment: 229 Wound Status Wound Number: [id number] Primary Diabetic Wound/Ulcer of the Lower Extremity Etiology: Wound Location: Left, Proximal, Dorsal Foot Wound Open Wounding Event: Blister Status: Date Acquired: 06/07/2020 Comorbid Chronic sinus problems/congestion, Arrhythmia, Hypertension, Weeks Of Treatment: 1 History: Peripheral Arterial Disease, Type II Diabetes, History of Burn, Clustered Wound: No Gout, Confinement Anxiety Photos Photo Uploaded By: Mikeal Hawthorne on 06/15/2020 11:05:06 Wound Measurements Length: (cm) 3 Width: (cm) 1.5 Depth: (cm) 0.1 Area: (cm) 3.534 Volume: (cm) 0.353 % Reduction in Area: -275.2% % Reduction in Volume: -275.5% Epithelialization: None Tunneling: No Undermining: No Wound Description Classification: Grade 2 Wound Margin: Indistinct, nonvisible Exudate Amount: Medium Exudate Type: Serous Exudate Color: amber Foul Odor After Cleansing: No Slough/Fibrino No Wound Bed Granulation Amount: Large (67-100%) Exposed Structure Granulation Quality: Pink, Pale Fascia Exposed: No Necrotic Amount: None Present (0%) Fat Layer (Subcutaneous Tissue) Exposed: No Tendon Exposed: No Muscle Exposed: No Joint Exposed: No Bone Exposed: No Treatment  Notes Wound #180 (Left, Proximal, Dorsal Foot) 1. Cleanse With Wound Cleanser Soap and water 3. Primary Dressing Applied Calcium Alginate Ag 4. Secondary Dressing Dry Gauze 6. Support Layer Applied 4 layer compression wrap Notes Biomedical scientist) Signed: 06/16/2020 5:19:54 PM By: Levan Hurst RN, BSN Entered By:  Levan Hurst on 06/14/2020 10:35:41 -------------------------------------------------------------------------------- Wound Assessment Details Patient Name: Date of Service: CO ROSA, GAMBALE 06/14/2020 10:15 A M Medical Record Number: [id number] Patient Account Number: [id number] Date of Birth/Sex: Treating RN: 01-Apr-1951 (69 y.o. [name] Primary Care Estefana Taylor: [name], [name] Other Clinician: Referring Jissell Trafton: Treating Keiarra Charon/Extender: Worthy Keeler MCGO [name], [name] in Treatment: 229 Wound Status Wound Number: [id number] Primary Diabetic Wound/Ulcer of the Lower Extremity Etiology: Wound Location: Left T Second oe Wound Open Wounding Event: Blister Status: Date Acquired: 06/07/2020 Comorbid Chronic sinus problems/congestion, Arrhythmia, Hypertension, Weeks Of Treatment: 1 History: Peripheral Arterial Disease, Type II Diabetes, History of Burn, Clustered Wound: No Gout, Confinement Anxiety Photos Photo Uploaded By: Mikeal Hawthorne on 06/15/2020 11:05:06 Wound Measurements Length: (cm) 1.1 Width: (cm) 0.7 Depth: (cm) 0.1 Area: (cm) 0.605 Volume: (cm) 0.06 % Reduction in Area: 51.9% % Reduction in Volume: 52.4% Epithelialization: Medium (34-66%) Tunneling: No Undermining: No Wound Description Classification: Grade 1 Wound Margin: Flat and Intact Exudate Amount: Small Exudate Type: Serous Exudate Color: amber Foul Odor After Cleansing: No Slough/Fibrino No Wound Bed Granulation Amount: Large (67-100%) Exposed Structure Granulation Quality: Pink, Pale Fascia Exposed: No Necrotic Amount: None Present (0%) Fat  Layer (Subcutaneous Tissue) Exposed: No Tendon Exposed: No Muscle Exposed: No Joint Exposed: No Bone Exposed: No Treatment Notes Wound #181 (Left Toe Second) 1. Cleanse With Wound Cleanser Soap and water 3. Primary Dressing Applied Calcium Alginate Ag 4. Secondary Dressing Dry Gauze 6. Support Layer Applied 4 layer compression wrap Notes stocknette Electronic Signature(s) Signed: 06/16/2020 5:19:54 PM By: Levan Hurst RN, BSN Entered By: Levan Hurst on 06/14/2020 10:36:33 -------------------------------------------------------------------------------- Wound Assessment Details Patient Name: Date of Service: [name], Fidel J. 06/14/2020 10:15 A M Medical Record Number: [id number] Patient Account Number: [id number] Date of Birth/Sex: Treating RN: 1950/11/05 (69 y.o. [name] Primary Care Shyne Lehrke: [name], [name] Other Clinician: Referring Mattalyn Anderegg: Treating Lennyn Bellanca/Extender: Worthy Keeler MCGO [name], [name] in Treatment: 229 Wound Status Wound Number: [id number] Primary Venous Leg Ulcer Etiology: Wound Location: Left, Lateral Lower Leg Wound Open Wounding Event: Gradually Appeared Status: Date Acquired: 06/14/2020 Comorbid Chronic sinus problems/congestion, Arrhythmia, Hypertension, Weeks Of Treatment: 0 History: Peripheral Arterial Disease, Type II Diabetes, History of Burn, Clustered Wound: No Gout, Confinement Anxiety Photos Photo Uploaded By: Mikeal Hawthorne on 06/15/2020 11:05:35 Wound Measurements Length: (cm) 1.5 Width: (cm) 1.2 Depth: (cm) 0.1 Area: (cm) 1.414 Volume: (cm) 0.141 % Reduction in Area: % Reduction in Volume: Epithelialization: None Tunneling: No Undermining: No Wound Description Classification: Partial Thickness Wound Margin: Flat and Intact Exudate Amount: Small Exudate Type: Serous Exudate Color: amber Foul Odor After Cleansing: No Slough/Fibrino No Wound Bed Granulation Amount: Large (67-100%) Exposed  Structure Granulation Quality: Pink, Pale Fascia Exposed: No Necrotic Amount: None Present (0%) Fat Layer (Subcutaneous Tissue) Exposed: No Tendon Exposed: No Muscle Exposed: No Joint Exposed: No Bone Exposed: No Treatment Notes Wound #182 (Left, Lateral Lower Leg) 1. Cleanse With Wound Cleanser Soap and water 3. Primary Dressing Applied Calcium Alginate Ag 4. Secondary Dressing Dry Gauze 6. Support Layer Applied 4 layer compression wrap Notes Biomedical scientist) Signed: 06/16/2020 5:19:54 PM By: Levan Hurst RN, BSN Entered By: Levan Hurst on 06/14/2020 10:33:24 -------------------------------------------------------------------------------- Vitals Details Patient Name: Date of Service: [name], Darcell J. 06/14/2020 10:15 A M Medical Record Number: [id number] Patient Account Number: [id number] Date of Birth/Sex: Treating RN: [birth year] (69 y.o. [name] Primary Care Chani Ghanem: [name], [name] Other Clinician: Referring Brea Coleson: Treating Sabree Nuon/Extender: Melburn Hake,  Hoyt MCGO [name], [name] in Treatment: 229 Vital Signs Time Taken: 10:13 Temperature (F): 98.1 Height (in): 70 Pulse (bpm): 89 Weight (lbs): 380.2 Respiratory Rate (breaths/min): 20 Body Mass Index (BMI): 54.5 Blood Pressure (mmHg): 172/49 Capillary Blood Glucose (mg/dl): 184 Reference Range: 80 - 120 mg / dl Notes glucose per pt report Electronic Signature(s) Signed: 06/16/2020 5:19:54 PM By: Levan Hurst RN, BSN Entered By: Levan Hurst on 06/14/2020 10:14:34

## 2020-06-21 ENCOUNTER — Encounter (HOSPITAL_BASED_OUTPATIENT_CLINIC_OR_DEPARTMENT_OTHER): Payer: Medicare Other | Admitting: Physician Assistant

## 2020-06-21 DIAGNOSIS — L97211 Non-pressure chronic ulcer of right calf limited to breakdown of skin: Secondary | ICD-10-CM | POA: Diagnosis not present

## 2020-06-21 DIAGNOSIS — I890 Lymphedema, not elsewhere classified: Secondary | ICD-10-CM | POA: Diagnosis not present

## 2020-06-21 DIAGNOSIS — E1140 Type 2 diabetes mellitus with diabetic neuropathy, unspecified: Secondary | ICD-10-CM | POA: Diagnosis not present

## 2020-06-21 DIAGNOSIS — I87333 Chronic venous hypertension (idiopathic) with ulcer and inflammation of bilateral lower extremity: Secondary | ICD-10-CM | POA: Diagnosis not present

## 2020-06-21 DIAGNOSIS — L97221 Non-pressure chronic ulcer of left calf limited to breakdown of skin: Secondary | ICD-10-CM | POA: Diagnosis not present

## 2020-06-21 DIAGNOSIS — Z9119 Patient's noncompliance with other medical treatment and regimen: Secondary | ICD-10-CM | POA: Diagnosis not present

## 2020-06-21 DIAGNOSIS — I272 Pulmonary hypertension, unspecified: Secondary | ICD-10-CM | POA: Diagnosis not present

## 2020-06-21 DIAGNOSIS — L03116 Cellulitis of left lower limb: Secondary | ICD-10-CM | POA: Diagnosis not present

## 2020-06-21 DIAGNOSIS — I482 Chronic atrial fibrillation, unspecified: Secondary | ICD-10-CM | POA: Diagnosis not present

## 2020-06-21 DIAGNOSIS — E11622 Type 2 diabetes mellitus with other skin ulcer: Secondary | ICD-10-CM | POA: Diagnosis not present

## 2020-06-21 NOTE — Progress Notes (Addendum)
[name], [name] ([id number]) Visit Report for 06/21/2020 Chief Complaint Document Details Patient Name: Date of Service: [name], [name] 06/21/2020 10:15 A M Medical Record Number: [id number] Patient Account Number: [id number] Date of Birth/Sex: Treating RN: [birth year] (69 y.o. [name] Primary Care Provider: Marney Setting, [name] Other Clinician: Referring Provider: Treating Provider/Extender: Lenda Kelp [facility] [name], [name] in Treatment: 230 Information Obtained from: Patient Chief Complaint patient is here for evaluation venous/lymphedema weeping Electronic Signature(s) Signed: 06/21/2020 10:15:25 AM By: Lenda Kelp PA-C Entered By: Lenda Kelp on 06/21/2020 10:15:24 -------------------------------------------------------------------------------- HPI Details Patient Name: Date of Service: [name], [name] 06/21/2020 10:15 A M Medical Record Number: [id number] Patient Account Number: [id number] Date of Birth/Sex: Treating RN: [birth year] (69 y.o. [name] Primary Care Provider: Marney Setting, [name] Other Clinician: Referring Provider: Treating Provider/Extender: Lenda Kelp [facility] [name], [name] in Treatment: 230 History of Present Illness HPI Description: Referred by PCP for consultation. Patient has long standing history of BLE venous stasis, no prior ulcerations. At beginning of month, developed cellulitis and weeping. Received IM Rocephin followed by Keflex and resolved. Wears compression stocking, appr 6 months old. Not sure strength. No present drainage. 01/22/16 this is a patient who is a type II diabetic on insulin. 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 He tells me he religiously wears pressure stockings of uncertain strength. He was here with weeping edema about 8 months ago but did not have an open wound. Roughly a month ago he had a reopening on his bilateral legs. He is been using bandages and Neosporin. He  does not complain of pain. 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 Last BUN/creatinine I have is from 11/20/15 at 13 and 1.0 respectively 01/29/16; patient arrives today having tolerated the Profore wrap. He brought in his stockings and these are 18 mmHg stockings he bought from [place]. The compression here is likely inadequate. He does not complain of pain or excessive drainage she has no systemic symptoms. The wound on the right looks improved as does the one on the left although one on the left is more substantial with still tissue at risk below the actual wound area on the bilateral posterior calf 02/05/16; patient arrives with poor edema control. He states that we did put a 4 layer compression on it last week. No weight appear 5 this. 02/12/16; the area on the posterior right Has healed. The left Has a substantial wound that has necrotic surface eschar that requires a debridement with a curette. 02/16/16;the patient called or a Nurse visit secondary to increased swelling. He had been in earlier in the week with his right leg healed. He was transitioned to is on pressure stocking on the right leg with the only open wound on the left, a substantial area on the left posterior calf. Note he has a history of severe lower extremity edema, he has a history of chronic atrial fibrillation but not heart failure per my notes but I'll need to research this. He is not complaining of chest pain shortness of breath or orthopnea. The intake nurse noted blisters on the previously closed right leg 02/19/16; this is the patient's regular visit day. I see him on Friday with escalating edema new wounds on the right leg and clear signs of at least right ventricular heart failure. I increased his Lasix to  40 twice a day. He is returning currently in follow-up. States he is noticed a decrease in that the  edema 02/26/16 patient's legs have much less edema. There is nothing really open on the right leg. The left leg has improved condition of the large superficial wound on the posterior left leg 03/04/16; edema control is very much better. The patient's right leg wounds have healed. On the left leg he continues to have severe venous inflammation on the posterior aspect of the left leg. There is no tenderness and I don't think any of this is cellulitis. 03/11/16; patient's right leg is married healed and he is in his own stocking. The patient's left leg has deteriorated somewhat. There is a lot of erythema around the wound on the posterior left leg. There is also a significant rim of erythema posteriorly just above where the wrap would've ended there is a new wound in this location and a lot of tenderness. Can't rule out cellulitis in this area. 03/15/16; patient's right leg remains healed and he is in his own stocking. The patient's left leg is much better than last review. His major wound on the posterior aspect of his left Is almost fully epithelialized. He has 3 small injuries from the wraps. Really. Erythema seems a lot better on antibiotics 03/18/16; right leg remains healed and he is in his own stocking. The patient's left leg is much better. The area on the posterior aspect of the left calf is fully epithelialized. His 3 small injuries which were wrap injuries on the left are improved only one seems still open his erythema has resolved 03/25/16; patient's right leg remains healed and he is in his own stocking. There is no open area today on the left leg posterior leg is completely closed up. His wrap injuries at the superior aspect of his leg are also resolved. He looks as though he has some irritation on the dorsal ankle but this is fully epithelialized without evidence of infection. 03/28/16; we discharged this patient on Monday. Transitioned him into his own stocking. There were problems almost  immediately with uncontrolled swelling weeping edema multiple some of which have opened. He does not feel systemically unwell in particular no chest pain no shortness of breath and he does not feel 04/08/16; the edema is under better control with the Profore light wrap but he still has pitting edema. There is one large wound anteriorly 2 on the medial aspect of his left leg and 3 small areas on the superior posterior calf. Drainage is not excessive he is tolerating a Profore light well 04/15/16; put a Profore wrap on him last week. This is controlled is edema however he had a lot of pain on his left anterior foot most of his wounds are healed 04/22/16 once again the patient has denuded areas on the left anterior foot which he states are because his wrap slips up word. He saw his primary physician today is on Lasix 40 twice a day and states that he his weight is down 20 pounds over the last 3 months. 04/29/16: Much improved. left anterior foot much improved. He is now on Lasix 80 mg per day. Much improved edema control 05/06/16; I was hoping to be able to discharge him today however once again he has blisters at a low level of where the compression was placed last week mostly on his left lateral but also his left medial leg and a small area on the anterior part of the left foot. 05/09/16;  apparently the patient went home after his appointment on 7/4 later in the evening developing pain in his upper medial thigh together with subjective fever and chills although his temperature was not taken. The pain was so intense he felt he would probably have to call 911. However he then remembered that he had leftover doxycycline from a previous round of antibiotics and took these. By the next morning he felt a lot better. He called and spoke to one of our nurses and I approved doxycycline over the phone thinking that this was in relation to the wounds we had previously seen although they were definitely were not. The  patient feels a lot better old fever no chills he is still working. Blood sugars are reasonably controlled 05/13/16; patient is back in for review of his cellulitis on his anterior medial upper thigh. He is taking doxycycline this is a lot better. Culture I did of the nodular area on the dorsal aspect of his foot grew MRSA this also looks a lot better. 05/20/16; the patient is cellulitis on the medial upper thigh has resolved. All of his wound areas including the left anterior foot, areas on the medial aspect of the left calf and the lateral aspect of the calf at all resolved. He has a new blister on the left dorsal foot at the level of the fourth toe this was excised. No evidence of infection 05/27/16; patient continues to complain weeping edema. He has new blisterlike wounds on the left anterior lateral and posterior lateral calf at the top of his wrap levels. The area on his left anterior foot appears better. He is not complaining of fever, pain or pruritus in his feet. 05/30/16; the patient's blisters on his left anterior leg posterior calf all look improved. He did not increase the Lasix 100 mg as I suggested because he was going to run out of his 40 mg tablets. He is still having weeping edema of his toes 06/03/16; I renewed his Lasix at 80 mg once a day as he was about to run out when I last saw him. He is on 80 mg of Lasix now. I have asked him to cut down on the excessive amount of water he was drinking and asked him to drink according to his thirst mechanisms 06/12/2016 -- was seen 2 days ago and was supposed to wear his compression stockings at home but he is developed lymphedema and superficial blisters on the left lower extremity and hence came in for a review 06/24/16; the remaining wound is on his left anterior leg. He still has edema coming from between his toes. There is lymphedema here however his edema is generally better than when I last saw this. He has a history of atrial fibrillation  but does not have a known history of congestive heart failure nevertheless I think he probably has this at least on a diastolic basis. 07/01/16 I reviewed his echocardiogram from January 2017. This was essentially normal. He did not have LVH, EF of 55-60%. His right ventricular function was normal although he did have trivial tricuspid and pulmonic regurgitation. This is not audible on exam however. I increased his Lasix to do massive edema in his legs well above his knees I think in early July. He was also drinking an excessive amount of water at the time. 07/15/16; missed his appointment last week because of the Labor Day holiday on Monday. He could not get another appointment later in the week. Started to feel the wrap  digging in superiorly so we remove the top half and the bottom half of his wrap. He has extensive erythema and blistering superiorly in the left leg. Very tender. Very swollen. Edema in his foot with leaking edema fluid. He has not been systemically unwell 07/22/16; the area on the left leg laterally required some debridement. The medial wounds look more stable. His wrap injury wounds appear to have healed. Edema and his foot is better, weeping edema is also better. He tells me he is meeting with the supplier of the external compression pumps at work 08/05/16; the patient was on vacation last week in [facility]. His wrap is been on for an extended period of time. Also over the weekend he developed an extensive area of tender erythema across his anterior medial thigh. He took to doxycycline yesterday that he had leftover from a previous prescription. The patient complains of weeping edema coming out of his toes 08/08/16; I saw this patient on 10/2. He was tender across his anterior thigh. I put him on doxycycline. He returns today in follow-up. He does not have any open wounds on his lower leg, he still has edema weeping into his toes. 08/12/16; patient was seen back urgently today to  follow-up for his extensive left thigh cellulitis/erysipelas. He comes back with a lot less swelling and erythema pain is much better. I believe I gave him Augmentin and Cipro. His wrap was cut down as he stated a roll down his legs. He developed blistering above the level of the wrap that remained. He has 2 open blisters and 1 intact. 08/19/16; patient is been doing his primary doctor who is increased his Lasix from 40-80 once a day or 80 already has less edema. Cellulitis has remained improved in the left thigh. 2 open areas on the posterior left calf 08/26/16; he returns today having new open blisters on the anterior part of his left leg. He has his compression pumps but is not yet been shown how to use some vital representative from the supplier. 09/02/16 patient returns today with no open wounds on the left leg. Some maceration in his plantar toes 09/10/2016 -- Dr. Leanord Hawking had recently discharged him on 09/02/2016 and he has come right back with redness swelling and some open ulcers on his left lower extremity. He says this was caused by trying to apply his compression stockings and he's been unable to use this and has not been able to use his lymphedema pumps. He had some doxycycline leftover and he has started on this a few days ago. 09/16/16; there are no open wounds on his leg on the left and no evidence of cellulitis. 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 He does not complain of symptoms here. I am not clear use using his external compression pumps. 09/23/16; I have not seen this patient in 2 weeks. He canceled his appointment 10 days ago as he was going on vacation. He tells me that on Monday he noticed a large area on his posterior left leg which is been draining copiously and is reopened into a large wound. He is been using ABDs and the external part of his juxtalite, according to our nurse this was not on properly. 10/07/16;  Still a substantial area on the posterior left leg. Using silver alginate 10/14/16; in general better although there is still open area which looks healthy. Still using silver alginate. He reminds me that this happen before he  left for [facility]. T oday while he was showering in the morning. He had been using his juxtalite's 10/21/16; the area on his posterior left leg is fully epithelialized. However he arrives today with a large area of tender erythema in his medial and posterior left thigh just above the knee. I have marked the area. Once again he is reluctant to consider hospitalization. I treated him with oral antibiotics in the past for a similar situation with resolution I think with doxycycline however this area it seems more extensive to me. He is not complaining of fever but does have chills and says states he is thirsty. His blood sugar today was in the 140s at home 10/25/16 the area on his posterior left leg is fully epithelialized although there is still some weeping edema. The large area of tenderness and erythema in his medial and posterior left thigh is a lot less tender although there is still a lot of swelling in this thigh. He states he feels a lot better. He is on doxycycline and Augmentin that I started last week. This will continued until Tuesday, December 26. I have ordered a duplex ultrasound of the left thigh rule out DVT whether there is an abscess something that would need to be drained I would also like to know. 11/01/16; he still has weeping edema from a not fully epithelialized area on his left posterior calf. Most of the rest of this looks a lot better. He has completed his antibiotics. His thigh is a lot better. Duplex ultrasound did not show a DVT in the thigh 11/08/16; he comes in today with more Denuded surface epithelium from the posterior aspect of his calf. There is no real evidence of cellulitis. The superior aspect of his wrap appears to have put quite an  indentation in his leg just below the knee and this may have contributed. He does not complain of pain or fever. We have been using silver alginate as the primary dressing. The area of cellulitis in the right thigh has totally resolved. He has been using his compression stockings once a week 11/15/16; the patient arrives today with more loss of epithelium from the posterior aspect of his left calf. He now has a fairly substantial wound in this area. The reason behind this deterioration isn't exactly clear although his edema is not well controlled. He states he feels he is generally more swollen systemically. He is not complaining of chest pain shortness of breath fever. T me he has an appointment with his primary physician in early February. He is on 80 mg of oral ells Lasix a day. He claims compliance with the external compression pumps. He is not having any pain in his legs similar to what he has with his recurrent cellulitis 11/22/16; the patient arrives a follow-up of his large area on his left lateral calf. This looks somewhat better today. He came in earlier in the week for a dressing change since I saw him a week ago. He is not complaining of any pain no shortness of breath no chest pain 11/28/16; the patient arrives for follow-up of his large area on the left lateral calf this does not look better. In fact it is larger weeping edema. The surface of the wound does not look too bad. We have been using silver alginate although I'm not certain that this is a dressing issue. 12/05/16; again the patient follows up for a large wound on the left lateral and left posterior calf this does not  look better. There continues to be weeping edema necrotic surface tissue. More worrisome than this once again there is erythema below the wound involving the distal Achilles and heel suggestive of cellulitis. He is on his feet working most of the day of this is not going well. We are changing his dressing twice a week to  facilitate the drainage. 12/12/16; not much change in the overall dimensions of the large area on the left posterior calf. This is very inflamed looking. I gave him an. Doxycycline last week does not really seem to have helped. He found the wrap very painful indeed it seems to of dog into his legs superiorly and perhaps around the heel. He came in early today because the drainage had soaked through his dressings. 12/19/16- patient arrives for follow-up evaluation of his left lower extremity ulcers. He states that he is using his lymphedema pumps once daily when there is "no drainage". He admits to not using his lipedema pumps while under current treatment. His blood sugars have been consistently between 150-200. 12/26/16; the patient is not using his compression pumps at home because of the wetness on his feet. I've advised him that I think it's important for him to use this daily. He finds his feet too wet, he can put a plastic bag over his legs while he is in the pumps. Otherwise I think will be in a vicious circle. We are using silver alginate to the major area on his left posterior calf 01/02/17; the patient's posterior left leg has further of all into 3 open wounds. All of them covered with a necrotic surface. He claims to be using his compression pumps once a day. His edema control is marginal. Continue with silver alginate 01/10/17; the patient's left posterior leg actually looks somewhat better. There is less edema, less erythema. Still has 3 open areas covered with a necrotic surface requiring debridement. He claims to be using his compression pumps once a day his edema control is better 01/17/17; the patient's left posterior calf look better last week when I saw him and his wrap was changed 2 days ago. He has noted increasing pain in the left heel and arrives today with much larger wounds extensive erythema extending down into the entire heel area especially tender medially. He is not  systemically unwell CBGs have been controlled no fever. Our intake nurse showed me limegreen drainage on his AVD pads. 01/24/17; his usual this patient responds nicely to antibiotics last week giving him Levaquin for presumed Pseudomonas. The whole entire posterior part of his leg is much better much less inflamed and in the case of his Achilles heel area much less tender. He has also had some epithelialization posteriorly there are still open areas here and still draining but overall considerably better 01/31/17- He has continue to tolerate the compression wraps. he states that he continues to use the lymphedema pumps daily, and can increase to twice daily on the weekends. He is voicing no complaints or concerns regarding his LLE ulcers 02/07/17-he is here for follow-up evaluation. He states that he noted some erythema to the left medial and anterior thigh, which he states is new as of yesterday. He is concerned about recurrent cellulitis. He states his blood sugars have been slightly elevated, this morning in the 180s 02/14/17; he is here for follow-up evaluation. When he was last here there was erythema superiorly from his posterior wound in his anterior thigh. He was prescribed Levaquin however a culture of the wound surface  grew MRSA over the phone I changed him to doxycycline on Monday and things seem to be a lot better. 02/24/17; patient missed his appointment on Friday therefore we changed his nurse visit into a physician visit today. Still using silver alginate on the large area of the posterior left thigh. He isn't new area on the dorsal left second toe 03/03/17; actually better today although he admits he has not used his external compression pumps in the last 2 days or so because of work responsibilities over the weekend. 03/10/17; continued improvement. External compression pumps once a day almost all of his wounds have closed on the posterior left calf. Better edema control 03/17/17; in general  improved. He still has 3 small open areas on the lateral aspect of his left leg however most of the area on the posterior part of his leg is epithelialized. He has better edema control. He has an ABD pad under his stocking on the right anterior lower leg although he did not let us look at that today. 03/24/17; patient arrives back in clinic today with no open areas however there are areas on the posterior left calf and anterior left calf that are less than 100% epithelialized. His edema is well controlled in the left lower leg. There is some pitting edema probably lymphedema in the left upper thigh. He uses compression pumps at home once per day. I tried to get him to do this twice a day although he is very reticent. 04/01/2017 -- for the last 2 days he's had significant redness, tenderness and weeping and came in for an urgent visit today. 04/07/17; patient still has 6 more days of doxycycline. He was seen by Dr. Meyer Russel last Wednesday for cellulitis involving the posterior aspect, lateral aspect of his Involving his heel. For the most part he is better there is less erythema and less weeping. He has been on his feet for 12 hours 2 over the weekend. Using his compression pumps once a day 04/14/17 arrives today with continued improvement. Only one area on the posterior left calf that is not fully epithelialized. He has intense bilateral venous inflammation associated with his chronic venous insufficiency disease and secondary lymphedema. We have been using silver alginate to the left posterior calf wound In passing he tells [place] today that the right leg but we have not seen in quite some time has an open area on it but he doesn't want [place] to look at this today states he will show this to [place] next week. 04/21/17; there is no open area on his left leg although he still reports some weeping edema. He showed [place] his right leg today which is the first time we've seen this leg in a long time. He has a large area of  open wound on the right leg anteriorly healthy granulation. Quite a bit of swelling in the right leg and some degree of venous inflammation. He told us about the right leg in passing last week but states that deterioration in the right leg really only happened over the weekend 04/28/17; there is no open area on the left leg although there is an irritated part on the posterior which is like a wrap injury. The wound on the right leg which was new from last week at least to [place] is a lot better. 05/05/17; still no open area on the left leg. Patient is using his new compression stocking which seems to be doing a good job of controlling the edema. He states he  is using his compression pumps once per day. The right leg still has an open wound although it is better in terms of surface area. Required debridement. A lot of pain in the posterior right Achilles marked tenderness. Usually this type of presentation this patient gives concern for an active cellulitis 05/12/17; patient arrives today with his major wound from last week on the right lateral leg somewhat better. Still requiring debridement. He was using his compression stocking on the left leg however that is reopened with superficial wounds anteriorly he did not have an open wound on this leg previously. He is still using his juxta light's once daily at night. He cannot find the time to do this in the morning as he has to be at work by 7 AM 05/19/17; right lateral leg wound looks improved. No debridement required. The concerning area is on the left posterior leg which appears to almost have a subcutaneous hemorrhagic component to it. We've been using silver alginate to all the wounds 05/26/17; the right lateral leg wound continues to look improved. However the area on the left posterior calf is a tightly adherent surface. [place] using silver alginate. Because of the weeping edema in his legs there is very little good alternatives. 06/02/17; the patient left  here last week looking quite good. Major wound on the left posterior calf and a small one on the right lateral calf. Both of these look satisfactory. He tells me that by Wednesday he had noted increased pain in the left leg and drainage. He called on Thursday and Friday to get an appointment here but we were blocked. He did not go to urgent care or his primary physician. He thinks he had a fever on Thursday but did not actually take his temperature. He has not been using his compression pumps on the left leg because of pain. I advised him to go to the emergency room today for IV antibiotics for stents of left leg cellulitis but he has refused I have asked him to take 2 days off work to keep his leg elevated and he has refused this as well. In view of this I'm going to call him and Augmentin and doxycycline. 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 However after a detailed discussion regarding the risk benefits and alternatives he refuses to get admitted to the hospital. With no other recourse I will continue him on oral antibiotics as before and hopefully he'll have his infectious disease consultation this week. 06/16/2017 -- the patient was seen today by the nurse practitioner at infectious disease Ms. Dixon. 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 He has also been advise Lotrimin cream locally. she also had wise IV antibiotics for his condition if it flares up 06/23/17; patient arrives today with drainage bilaterally although the remaining wound on the left posterior calf after  cleaning up today "highlighter yellow drainage" did not look too bad. Unfortunately he has had breakdown on the right anterior leg [previously this leg had not been open and he is using a black stocking] he went  to see infectious disease and is been put on clindamycin 150 mg daily, I did not verify the dose although I'm not familiar with using clindamycin in this dosing range, perhaps for prophylaxisoo 06/27/17; I brought this patient back today to follow-up on the wound deterioration on the right lower leg together with surrounding cellulitis. I started him on doxycycline 4 days ago. This area looks better however he comes in today with intense cellulitis on the medial part of his left thigh. This is not have a wound in this area. Extremely tender. We've been using silver alginate to the wounds on the right lower leg left lower leg with bilateral 4 layer compression he is using his external compression pumps once a day 07/04/17; patient's left medial thigh cellulitis looks better. He has not been using his compression pumps as his insert said it was contraindicated with cellulitis. His right leg continues to make improvements all the wounds are still open. We only have one remaining wound on the left posterior calf. Using silver alginate to all open areas. He is on doxycycline which I started a week ago and should be finishing I gave him Augmentin after Thursday's visit for the severe cellulitis on the left medial thigh which fortunately looks better 07/14/17; the patient's left medial thigh cellulitis has resolved. The cellulitis in his right lower calf on the right also looks better. All of his wounds are stable to improved we've been using silver alginate he has completed the antibiotics I have given him. He has clindamycin 150 mg once a day prescribed by infectious disease for prophylaxis, I've advised him to start this now. We have been using bilateral Unna boots over silver alginate to the wound  areas 07/21/17; the patient is been to see infectious disease who noted his recurrent problems with cellulitis. He was not able to tolerate prophylactic clindamycin therefore he is on amoxicillin 500 twice a day. He also had a second daily dose of Lasix added By Dr. Oneta Rack but he is not taking this. Nor is he being completely compliant with his compression pumps a especially not this week. He has 2 remaining wounds one on the right posterior lateral lower leg and one on the left posterior medial lower leg. 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 The patient has Unna boots bilaterally. 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 He has bilateral Unna boots for compression and silver alginate to his wounds. Arrives today with his legs looking as good as I have seen him in quite some time. Not surprisingly his wounds look better as well with improvement on the right lateral leg venous insufficiency wound and also the left medial leg. He is still using the compression pumps once a day 08/11/17; both legs appear to be doing better wounds on the right lateral and left medial legs look better. Skin on the right leg quite good. He is been using silver alginate as the primary dressing. I'm going to use Anasept gel calcium alginate and maintain all the secondary dressings 08/18/17; the patient continues to actually do quite well. The area on his right lateral leg is just about closed the left medial also looks better although it is still moist in this area. His edema is well controlled we have been using Anasept gel  with calcium alginate and the usual secondary dressings, 4 layer compression and once daily use of his compression pumps "always been able to manage 09/01/17; the patient  continues to do reasonably well in spite of his trip to [place]. The area on the right lateral leg is epithelialized. Left is much better but still open. He has more edema and more chronic erythema on the left leg [venous inflammation] 09/08/17; he arrives today with no open wound on the right lateral leg and decently controlled edema. Unfortunately his left leg is not nearly as in his good situation as last week.he apparently had increasing edema starting on Saturday. He edema soaked through into his foot so used a plastic bag to walk around his home. The area on the medial right leg which was his open area is about the same however he has lost surface epithelium on the left lateral which is new and he has significant pain in the Achilles area of the left foot. He is already on amoxicillin chronically for prophylaxis of cellulitis in the left leg 09/15/17; he is completed a week of doxycycline and the cellulitis in the left posterior leg and Achilles area is as usual improved. He still has a lot of edema and fluid soaking through his dressings. There is no open wound on the right leg. He saw infectious disease NP today 09/22/17;As usual 1 we transition him from our compression wraps to his stockings things did not go well. He has several small open areas on the right leg. He states this was caused by the compression wrap on his skin although he did not wear this with the stockings over them. He has several superficial areas on the left leg medially laterally posteriorly. He does not have any evidence of active cellulitis especially involving the left Achilles The patient is traveling from [facility] Saturday going to [facility]. He states he isn't attempting to get an appointment with a heel objects wound center there to change his dressings. I am not completely certain whether this will work 10/06/17; the patient came in on Friday for a nurse visit and the nurse reported that his legs actually look  quite good. He arrives in clinic today for his regular follow-up visit. He has a new wound on his left third toe over the PIP probably caused by friction with his footwear. He has small areas on the left leg and a very superficial but epithelialized area on the right anterior lateral lower leg. Other than that his legs look as good as I've seen him in quite some time. We have been using silver alginate Review of systems; no chest pain no shortness of breath other than this a 10 point review of systems negative 10/20/17; seen by Dr. Meyer Russel last week. He had taken some antibiotics [doxycycline] that he had left over. Dr. Meyer Russel thought he had candida infection and declined to give him further antibiotics. He has a small wound remaining on the right lateral leg several areas on the left leg including a larger area on the left posterior several left medial and anterior and a small wound on the left lateral. The area on the left dorsal third toe looks a lot better. ROS; Gen.; no fever, respiratory no cough no sputum Cardiac no chest pain other than this 10 point review of system is negative 10/30/17; patient arrives today having fallen in the bathtub 3 days ago. It took him a while to get up. He has pain and maceration in the  wounds on his left leg which have deteriorated. He has not been using his pumps he also has some maceration on the right lateral leg. 11/03/17; patient continues to have weeping edema especially in the left leg. This saturates his dressings which were just put on on 12/27. As usual the doxycycline seems to take care of the cellulitis on his lower leg. He is not complaining of fever, chills, or other systemic symptoms. He states his leg feels a lot better on the doxycycline I gave him empirically. He also apparently gets injections at his primary doctor's officeo Rocephin for cellulitis prophylaxis. I didn't ask him about his compression pump compliance today I think that's probably  marginal. Arrives in the clinic with all of his dressings primary and secondary macerated full of fluid and he has bilateral edema 11/10/17; the patient's right leg looks some better although there is still a cluster of wounds on the right lateral. The left leg is inflamed with almost circumferential skin loss medially to laterally although we are still maintaining anteriorly. He does not have overt cellulitis there is a lot of drainage. He is not using compression pumps. We have been using silver alginate to the wound areas, there are not a lot of options here 11/17/17; the patient's right leg continues to be stable although there is still open wounds, better than last week. The inflammation in the left leg is better. Still loss of surface layer epithelium especially posteriorly. There is no overt cellulitis in the amount of edema and his left leg is really quite good, tells me he is using his compression pumps once a day. 11/24/17; patient's right leg has a small superficial wound laterally this continues to improve. The inflammation in the left leg is still improving however we have continuous surface layer epithelial loss posteriorly. There is no overt cellulitis in the amount of edema in both legs is really quite good. He states he is using his compression pumps on the left leg once a day for 5 out of 7 days 12/01/17; very small superficial areas on the right lateral leg continue to improve. Edema control in both legs is better today. He has continued loss of surface epithelialization and left posterior calf although I think this is better. We have been using silver alginate with large number of absorptive secondary dressings 4 layer on the left Unna boot on the right at his request. He tells me he is using his compression pumps once a day 12/08/17; he has no open area on the right leg is edema control is good here. On the left leg however he has marked erythema and tenderness breakdown of skin. He has  what appears to be a wrap injury just distal to the popliteal fossa. 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 There was swelling erythema and breakdown in the right leg. He has much worse skin breakdown in the left leg as well multiple open areas medially and posteriorly as well as laterally. He tells me he has been using his compression pumps but tells me he feels that the drainage out of his leg is worse when he uses a compression pumps. T be fair to him he is been saying this o for a  while however I don't know that I have really been listening to this. I wonder if the compression pumps are working properly 12/22/17;. Once again he arrives with severe erythema, weeping edema from the left greater than right leg. Noncompliance with compression pumps. New this visit he is complaining of pain on the lateral aspect of the right leg and the medial aspect of his right thigh. He apparently saw his cardiologist Dr. [name] who was ordered an echocardiogram area and I think this is a step in the right direction 12/25/17; started his doxycycline Monday night. There is still intense erythema of the right leg especially in the anterior thigh although there is less tenderness. The erythema around the wound on the right lateral calf also is less tender. He still complaining of pain in the left heel. His wounds are about the same right lateral left medial left lateral. Superficial but certainly not close to closure. He denies being systemically unwell no fever chills no abdominal pain no diarrhea 12/29/17; back in follow-up of his extensive right calf and right thigh cellulitis. I added amoxicillin to cover possible doxycycline resistant  strep. This seems to of done the trick he is in much less pain there is much less erythema and swelling. He has his echocardiogram at 11:00 this morning. X-ray of the left heel was also negative. 01/05/18; the patient arrived with his edema under much better control. Now that he is retired he is able to use his compression pumps daily and sometimes twice a day per the patient. He has a wound on the right leg the lateral wound looks better. Area on the left leg also looks a lot better. He has no evidence of cellulitis in his bilateral thighs I had a quick peak at his echocardiogram. He is in normal ejection fraction and normal left ventricular function. He has moderate pulmonary hypertension moderately reduced right ventricular function. One would have to wonder about chronic sleep apnea although he says he doesn't snore. He'll review the echocardiogram with his cardiologist. 01/12/18; the patient arrives with the edema in both legs under exemplary control. He is using his compression pumps daily and sometimes twice daily. His wound on the right lateral leg is just about closed. He still has some weeping areas on the posterior left calf and lateral left calf although everything is just about closed here as well. I have spoken with Aldean Baker who is the patient's nurse practitioner and infectious disease. She was concerned that the patient had not understood that the parenteral penicillin injections he was receiving for cellulitis prophylaxis was actually benefiting him. I don't think the patient actually saw that I would tend to agree we were certainly dealing with less infections although he had a serious one last month. 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 He is healed. He'll be placed in juxtalite compression wraps and increase his lymphedema pumps to twice daily. We will follow up again next week to ensure there are no issues with the new  regiment. 01/20/18-he is here for evaluation of bilateral lower extremity weeping edema. Yesterday he was placed in compression wrap to the right lower extremity and compression stocking to left lower shrubbery. He states he uses lymphedema pumps last night and again this morning and noted a blister to the left lower extremity. On exam he was noted to have drainage to the right lower extremity. He will be placed in Unna boots bilaterally and follow-up next week 01/26/18; patient was  actually discharged a week ago to his own juxta light stockings only to return the next day with bilateral lower extremity weeping edema.he was placed in bilateral Unna boots. He arrives today with pain in the back of his left leg. There is no open area on the right leg however there is a linear/wrap injury on the left leg and weeping edema on the left leg posteriorly. I spoke with infectious disease about 10 days ago. They were disappointed that the patient elected to discontinue prophylactic intramuscular penicillin shots as they felt it was particularly beneficial in reducing the frequency of his cellulitis. I discussed this with the patient today. He does not share this view. He'll definitely need antibiotics today. Finally he is traveling to [place] and trauma leaving this Saturday and returning a week later and he does not travel with his pumps. He is going by car 01/30/18; patient was seen 4 days ago and brought back in today for review of cellulitis in the left leg posteriorly. I put him on amoxicillin this really hasn't helped as much as I might like. He is also worried because he is traveling to [facility] trauma by car. Finally we will be rewrapping him. There is no open area on the right leg over his left leg has multiple weeping areas as usual 02/09/18; The same wrap on for 10 days. He did not pick up the last doxycycline I prescribed for him. He apparently took 4 days worth he already had. There is nothing open on  his right leg and the edema control is really quite good. He's had damage in the left leg medially and laterally especially probably related to the prolonged use of Unna boots 02/12/18; the patient arrived in clinic today for a nurse visit/wrap change. He complained of a lot of pain in the left posterior calf. He is taking doxycycline that I previously prescribed for him. Unfortunately even though he used his stockings and apparently used to compression pumps twice a day he has weeping edema coming out of the lateral part of his right leg. This is coming from the lower anterior lateral skin area. 02/16/18; the patient has finished his doxycycline and will finish the amoxicillin 2 days. The area of cellulitis in the left calf posteriorly has resolved. He is no longer having any pain. He tells me he is using his compression pumps at least once a day sometimes twice. 02/23/18; the patient finished his doxycycline and Amoxil last week. On Friday he noticed a small erythematous circle about the size of a quarter on the left lower leg just above his ankle. This rapidly expanded and he now has erythema on the lateral and posterior part of the thigh. This is bright red. Also has an area on the dorsal foot just above his toes and a tender area just below the left popliteal fossa. He came off his prophylactic penicillin injections at his own insistence one or 2 months ago. This is obviously deteriorated since then 03/02/18; patient is on doxycycline and Amoxil. Culture I did last week of the weeping area on the back of his left calf grew group B strep. I have therefore renewed the amoxicillin 500 3 times a day for a further week. He has not been systemically unwell. Still complaining of an area of discomfort right under his left popliteal fossa. There is no open wound on the right leg. He tells me that he is using his pumps twice a day on most days 03/09/18; patient arrives in  clinic today completing his amoxicillin  today. The cellulitis on his left leg is better. Furthermore he tells me that he had intramuscular penicillin shots that his primary care office today. However he also states that the wrap on his right leg fell down shortly after leaving clinic last week. He developed a large blister that was present when he came in for a nurse visit later in the week and then he developed intense discomfort around this area.He tells me he is using his compression pumps 03/16/18; the patient has completed his doxycycline. The infectious part of this/cellulitis in the left heel area left popliteal area is a lot better. He has 2 open areas on the right calf. Still areas on the left calf but this is a lot better as well. 03/24/18; the patient arrives complaining of pain in the left popliteal area again. He thinks some of this is wrap injury. He has no open area on the right leg and really no open area on the left calf either except for the popliteal area. He claims to be compliant with the compression pumps 03/31/18; I gave him doxycycline last week because of cellulitis in the left popliteal area. This is a lot better although the surface epithelium is denuded off and response to this. He arrives today with uncontrolled edema in the right calf area as well as a fingernail injury in the right lateral calf. There is only a few open areas on the left 04/06/18; I gave him amoxicillin doxycycline over the last 2 weeks that the amoxicillin should be completing currently. He is not complaining of any pain or systemic symptoms. The only open areas see has is on the right lateral lower leg paradoxically I cannot see anything on the left lower leg. He tells me he is using his compression pumps twice a day on most days. Silver alginate to the wounds that are open under 4 layer compression 04/13/18; he completed antibiotics and has no new complaints. Using his compression pumps. Silver alginate that anything that's opened 04/20/18; he is  using his compression pumps religiously. Silver alginate 4 layer compression anything that's opened. He comes in today with no open wounds on the left leg but 3 on the right including a new one posteriorly. He has 2 on the right lateral and one on the right posterior. He likes Unna boots on the right leg for reasons that aren't really clear we had the usual 4 layer compression on the left. It may be necessary to move to the 4 layer compression on the right however for now I left them in the Unna boots 04/27/18; he is using his compression pumps at least once a day. He has still the wounds on the right lateral calf. The area right posteriorly has closed. He does not have an open wound on the left under 4 layer compression however on the dorsal left foot just proximal to the toes and the left third toe 2 small open areas were identified 05/11/18; he has not uses compression pumps. The areas on the right lateral calf have coalesced into one large wound necrotic surface. On the left side he has one small wound anteriorly however the edema is now weeping out of a large part of his left leg. He says he wasn't using his pumps because of the weeping fluid. I explained to him that this is the time he needs to pump more 05/18/18; patient states he is using his compression pumps twice a day. The area on  the right lateral large wound albeit superficial. On the left side he has innumerable number of small new wounds on the left calf particularly laterally but several anteriorly and medially. All these appear to have healthy granulated base these look like the remnants of blisters however they occurred under compression. The patient arrives in clinic today with his legs somewhat better. There is certainly less edema, less multiple open areas on the left calf and the right anterior leg looks somewhat better as well superficial and a little smaller. However he relates pain and erythema over the last 3-4 days in the thigh  and I looked at this today. He has not been systemically unwell no fever no chills no change in blood sugar values 05/25/18; comes in today in a better state. The severe cellulitis on his left leg seems better with the Keflex. Not as tender. He has not been systemically unwell Hard to find an open wound on the left lower leg using his compression pumps twice a day The confluent wounds on his right lateral calf somewhat better looking. These will ultimately need debridement I didn't do this today. 06/01/18; the severe cellulitis on the left anterior thigh has resolved and he is completed his Keflex. There is no open wound on the left leg however there is a superficial excoriation at the base of the third toe dorsally. Skin on the bottom of his left foot is macerated looking. The left the wounds on the lateral right leg actually looks some better although he did require debridement of the top half of this wound area with an open curet 06/09/18 on evaluation today patient appears to be doing poorly in regard to his right lower extremity in particular this appears to likely be infected he has very thick purulent discharge along with a bright green tent to the discharge. This makes me concerned about the possibility of pseudomonas. He's also having increased discomfort at this point on evaluation. Fortunately there does not appear to be any evidence of infection spreading to the other location at this time. 06/16/18 on evaluation today patient appears to actually be doing fairly well. His ulcer has actually diminished in size quite significantly at this point which is good news. Nonetheless he still does have some evidence of infection he did see infectious disease this morning before coming here for his appointment. I did review the results of their evaluation and their note today. They did actually have him discontinue the Cipro and initiate treatment with linezolid at this time. He is doing this for the  next seven days and they recommended a follow-up in four months with them. He is the keep a log of the need for intermittent antibiotic therapy between now and when he falls back up with infectious disease. This will help them gaze what exactly they need to do to try and help them out. 06/23/18; the patient arrives today with no open wounds on the left leg and left third toe healed. He is been using his compression pumps twice a day. On the right lateral leg he still has a sizable wound but this is a lot better than last time I saw this. 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 Has been using silver alginate under 4 layer compression 06/30/18; the only open wound he has is on the right lateral leg and this looks healthy. No debridement is required. We have  been using silver alginate. He does not have an open wound on the left leg. There is apparently some drainage from the dorsal proximal third toe on the left although I see no open wound here. 07/03/18 on evaluation today patient was actually here just for a nurse visit rapid change. However when he was here on Wednesday for his rat change due to having been healed on the left and then developing blisters we initiated the wrap again knowing that he would be back today for [place] to reevaluate and see were at. Unfortunately he has developed some cellulitis into the proximal portion of his right lower extremity even into the region of his thigh. 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 He was placed on one as what at that point. Nonetheless he is done with that and has been tolerating it well otherwise. Doxycycline which in the past really did not seem to be effective for him. Nonetheless I think the best option may be for [place] to definitely reinitiate the antibiotics for a longer period of time. 07/07/18; since I  last saw this patient a week ago he has had a difficult time. At that point he did not have an open wound on his left leg. We transitioned him into juxta light stockings. He was apparently in the clinic the next day with blisters on the left lateral and left medial lower calf. He also had weeping edema fluid. He was put back into a compression wrap. He was also in the clinic on Friday with intense erythema in his right thigh. Per the patient he was started on Bactrim however that didn't work at all in terms of relieving his pain and swelling. He has taken 3 doxycycline that he had left over from last time and that seems to of helped. He has blistering on the right thigh as well. 07/14/18; the erythema on his right thigh has gotten better with doxycycline that he is finishing. 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 Presumably this cellulitis in the thigh was not related to that although I think this is a bit concerning going forward. He still has an area on the right lateral calf the blister on the right medial calf just below the knee that was discussed above. On the left 2 small open areas left medial and left lateral. Edema control is adequate. He is using his compression pumps twice a day 07/20/18; continued improvement in the condition of both legs especially the edema in his bilateral thighs. He tells me he is been losing weight through a combination of diet and exercise. He is using his compression pumps twice a day. So overall she made to the remaining wounds 07/27/2018; continued improvement in condition of both legs. His edema is well controlled. The area on the right lateral leg is just about closed he had one blisters show up on the medial left upper calf. We have him in 4 layer compression. He is going on a 10-day trip to [place], [place] and [place]. He will be driving. He wants to wear Unna boots because of  the lessening amount of constriction. He will not use compression pumps while he is away 08/05/18 on evaluation today patient actually appears to be doing decently well all things considered in regard to his bilateral lower extremities. The worst ulcer is actually only posterior aspect of  his left lower extremity with a four layer compression wrap cut into his leg a couple weeks back. He did have a trip and actually had [employer] for the trip that he is worn since he was last here. Nonetheless he feels like the [employer] actually do better for him his swelling is up a little bit but he also with his trip was not taking his Lasix on a regular set schedule like he was supposed to be. He states that obviously the reason being that he cannot drive and keep going without having to urinate too frequently which makes it difficult. He did not have his pumps with him while he was away either which I think also maybe playing a role here too. 08/13/2018; the patient only has a small open wound on the right lateral calf which is a big improvement in the last month or 2. He also has the area posteriorly just below the posterior fossa on the left which I think was a wrap injury from several weeks ago. He has no current evidence of cellulitis. He tells me he is back into his compression pumps twice a day. He also tells me that while he was at the laundromat somebody stole a section of his extremitease stockings 08/20/2018; back in the clinic with a much improved state. He only has small areas on the right lateral mid calf which is just about healed. This was is more substantial area for quite a prolonged period of time. He has a small open area on the left anterior tibia. The area on the posterior calf just below the popliteal fossa is closed today. He is using his compression pumps twice a day 08/28/2018; patient has no open wound on the right leg. He has a smattering of open areas on the calf with some  weeping lymphedema. More problematically than that it looks as though his wraps of slipped down in his usual he has very angry upper area of edema just below the right medial knee and on the right lateral calf. He has no open area on his feet. The patient is traveling to [facility] next week. I will send him in an antibiotic. We will continue to wrap the right leg. We ordered extremitease stockings for him last week and I plan to transition the right leg to a stocking when he gets home which will be in 10 days time. As usual he is very reluctant to take his pumps with him when he travels 09/07/2018; patient returns from [facility]. He shows me a picture of his left leg in the mid part of his trip last week with intense fire engine erythema. The picture look bad enough I would have considered sending him to the hospital. Instead he went to the wound care center in [facility]. They did not prescribe him antibiotics but he did take some doxycycline he had leftover from a previous visit. I had given him trimethoprim sulfamethoxazole before he left this did not work according to the patient. This is resulted in some improvement fortunately. He comes back with a large wound on the left posterior calf. Smaller area on the left anterior tibia. Denuded blisters on the dorsal left foot over his toes. Does not have much in the way of wounds on the right leg although he does have a very tender area on the right posterior area just below the popliteal fossa also suggestive of infection. He promises me he is back on his  pumps twice a day 09/15/2018; the intense cellulitis in his left lower calf is a lot better. The wound area on the posterior left calf is also so better. However he has reasonably extensive wounds on the dorsal aspect of his second and third toes and the proximal foot just at the base of the toes. There is nothing open on the right leg 09/22/2018; the patient has excellent edema  control in his legs bilaterally. He is using his external compression pumps twice a day. He has no open area on the right leg and only the areas in the left foot dorsally second and third toe area on the left side. He does not have any signs of active cellulitis. 10/06/2018; the patient has good edema control bilaterally. He has no open wound on the right leg. There is a blister in the posterior aspect of his left calf that we had to deal with today. He is using his compression pumps twice a day. There is no signs of active cellulitis. We have been using silver alginate to the wound areas. He still has vulnerable areas on the base of his left first second toes dorsally He has a his extremities stockings and we are going to transition him today into the stocking on the right leg. He is cautioned that he will need to continue to use the compression pumps twice a day. If he notices uncontrolled edema in the right leg he may need to go to 3 times a day. 10/13/2018; the patient came in for a nurse check on Friday he has a large flaccid blister on the right medial calf just below the knee. We unroofed this. He has this and a new area underneath the posterior mid calf which was undoubtedly a blister as well. He also has several small areas on the right which is the area we put his extremities stocking on. 10/19/2018; the patient went to see infectious disease this morning I am not sure if that was a routine follow-up in any case the doxycycline I had given him was discontinued and started on linezolid. He has not started this. It is easy to look at his left calf and the inflammation and think this is cellulitis however he is very tender in the tissue just below the popliteal fossa and I have no doubt that there is infection going on here. He states the problem he is having is that with the compression pumps the edema goes down and then starts walking the wrap falls down. We will see if we can adhere this. He  has 1 or 2 minuscule open areas on the right still areas that are weeping on the posterior left calf, the base of his left second and third toes 10/26/18; back today in clinic with quite of skin breakdown in his left anterior leg. This may have been infection the area below the popliteal fossa seems a lot better however tremendous epithelial loss on the left anterior mid tibia area over quite inexpensive tissue. He has 2 blisters on the right side but no other open wound here. 10/29/2018; came in urgently to see [place] today and we worked him in for review. He states that the 4 layer compression on the right leg caused pain he had to cut it down to roughly his mid calf this caused swelling above the wrap and he has blisters and skin breakdown today. As a result of the pain he has not been using his pumps. Both legs are a lot more  edematous and there is a lot of weeping fluid. 11/02/18; arrives in clinic with continued difficulties in the right leg> left. Leg is swollen and painful. multiple skin blisters and new open areas especially laterally. He has not been using his pumps on the right leg. 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 He is not systemically unwell. 11/09/2018; the patient claims he is being compliant with his pumps. He is finished the doxycycline I gave him last week. Culture I did of the wound on the right lateral leg showed a few very resistant methicillin staph aureus. This was resistant to doxycycline. Nevertheless he states the pain in the leg is a lot better which makes me wonder if the cultured organism was not really what was causing the problem nevertheless this is a very dangerous organism to be culturing out of any wound. His right leg is still a lot larger than the left. He is using an Radio broadcast assistant on this area, he blames a 4-layer compression for causing the original skin breakdown which I doubt is true however I cannot talk him out  of it. We have been using silver alginate to all of these areas which were initially blisters 11/16/2018; patient is being compliant with his external compression pumps at twice a day. Miraculously he arrives in clinic today with absolutely no open wounds. He has better edema control on the left where he has been using 4 layer compression versus wound of wounds on the right and I pointed this out to him. There is no inflammation in the skin in his lower legs which is also somewhat unusual for him. There is no open wounds on the dorsal left foot. He has extremitease stockings at home and I have asked him to bring these in next week. 11/25/18 patient's lower extremity on examination today on the left appears for the most part to be wound free. He does have an open wound on the lateral aspect of the right lower extremity but this is minimal compared to what I've seen in past. He does request that we go ahead and wrap the left leg as well even though there's nothing open just so hopefully it will not reopen in short order. 1/28; patient has superficial open wounds on the right lateral calf left anterior calf and left posterior calf. His edema control is adequate. 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 We have been using silver alginate as the primary dressing. He claims compliance with his compression pumps 2/4; patient has superficial open wounds on numerous areas of his left calf and again one on the left dorsal foot. The areas on the right lateral calf have healed. The cellulitis that I gave him doxycycline for last week is also resolved this was mostly on the left anterior calf just below the tibial tuberosity. His edema looks fairly well-controlled. He tells me he went to see his primary doctor today and had blood work ordered 2/11; once again he has several open areas on the left calf left tibial area. Most of  these are small and appear to have healthy granulation. He does not have anything open on the right. The edema and control in his thighs is pretty good which is usually a good indication he has been using his pumps as requested. 2/18; he continues to have several small areas on the left calf and left tibial  area. Most of these are small healthy granulation. We put him in his stocking on the right leg last week and he arrives with a superficial open area over the right upper tibia and a fairly large area on the right lateral tibia in similar condition. His edema control actually does not look too bad, he claims to be using his compression pumps twice a day 2/25. Continued small areas on the left calf and left tibial area. New areas especially on the right are identified just below the tibial tuberosity and on the right upper tibia itself. There are also areas of weeping edema fluid even without an obvious wound. He does not have a considerable degree of lymphedema but clearly there is more edema here than his skin can handle. He states he is using the pumps twice a day. We have an Unna boot on the right and 4 layer compression on the left. 3/3; he continues to have an area on the right lateral calf and right posterior calf just below the popliteal fossa. There is a fair amount of tenderness around the wound on the popliteal fossa but I did not see any evidence of cellulitis, could just be that the wrap came down and rubbed in this area. He does not have an open area on the left leg however there is an area on the left dorsal foot at the base of the third toe We have been using silver alginate to all wound areas 3/10; he did not have an open area on his left leg last time he was here a week ago. T oday he arrives with a horizontal wound just below the tibial tuberosity and an area on the left lateral calf. He has intense erythema and tenderness in this area. The area is on the right lateral calf and  right posterior calf better than last week. We have been using silver alginate as usual 3/18 - Patient returns with 3 small open areas on left calf, and 1 small open area on right calf, the skin looks ok with no significant erythema, he continues the UNA boot on right and 4 layer compression on left. The right lateral calf wound is closed , the right posterior is small area. we will continue silver alginate to the areas. 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 He is been tolerating the dressing changes without complication. Fortunately she has made excellent improvement in regard to the overall status of his wounds. Unfortunately every time we cease wrapping him he ends up reopening in causing more significant issues at that point. Again I'm unsure of the best direction to take although I think the lymphedema clinic may be appropriate for him. 02/03/19 on evaluation today patient appears to be doing well in regard to the wounds that we saw him for last week unfortunately he has a new area on the proximal portion of his right medial/posterior lower extremity where the wrap somewhat slowed down and caused swelling and a blister to rub and open. Unfortunately this is the only opening that he has on either leg at this point. 02/17/19 on evaluation today patient's bilateral lower extremities appear to be doing well. He still completely healed in regard to the left lower extremity. In regard to the right lower extremity the area where the wrap and slid down  and caused the blister still seems to be slightly open although this is dramatically better than during the last evaluation two weeks ago. I'm very pleased with the way this stands overall. 03/03/19 on evaluation today patient appears to be doing well in regard  to his right lower extremity in general although he did have a new blister open this does not appear to be showing any evidence of active infection at this time. Fortunately there's No fevers, chills, nausea, or vomiting noted at this time. Overall I feel like he is making good progress it does feel like that the right leg will we perform the [employer] seems to do with a bit better than three layer wrap on the left which slid down on him. We may switch to doing bilateral in the book wraps. 5/4; I have not seen [name] in quite some time. According to our case manager he did not have an open wound on his left leg last week. He had 1 remaining wound on the right posterior medial calf. He arrives today with multiple openings on the left leg probably were blisters and/or wrap injuries from Unna boots. I do not think the Unna boot's will provide adequate compression on the left. I am also not clear about the frequency he is using the compression pumps. 03/17/19 on evaluation today patient appears to be doing excellent in regard to his lower extremities compared to last week's evaluation apparently. He had gotten significantly worse last week which is unfortunate. The [employer] wrap on the left did not seem to do very well for him at all and in fact it didn't control his swelling significantly enough he had an additional outbreak. Subsequently we go back to the four layer compression wrap on the left. This is good news. At least in that he is doing better and the wound seem to be killing him. He still has not heard anything from the lymphedema clinic. 03/24/19 on evaluation today patient actually appears to be doing much better in regard to his bilateral lower Trinity as compared to last week when I saw him. Fortunately there's no signs of active infection at this time. He has been tolerating the dressing changes without complication. Overall I'm extremely pleased with the progress and appearance in  general. 04/07/19 on evaluation today patient appears to be doing well in regard to his bilateral lower extremities. His swelling is significantly down from where it was previous. With that being said he does have a couple blisters still open at this point but fortunately nothing that seems to be too severe and again the majority of the larger openings has healed at this time. 04/14/19 on evaluation today patient actually appears to be doing quite well in regard to his bilateral lower extremities in fact I'm not even sure there's anything significantly open at this time at any site. Nonetheless he did have some trouble with these wraps where they are somewhat irritating him secondary to the fact that he has noted that the graph wasn't too close down to the end of this foot in a little bit short as well up to his knee. Otherwise things seem to be doing quite well. 04/21/19 upon evaluation today patient's wound bed actually showed evidence of being completely healed in regard to both lower extremities which is excellent news. There does not appear to be any signs of active infection which is also good news. I'm very pleased in this regard. No fevers, chills, nausea, or vomiting noted  at this time. 04/28/19 on evaluation today patient appears to be doing a little bit worse in regard to both lower extremities on the left mainly due to the fact that when he went infection disease the wrap was not wrapped quite high enough he developed a blister above this. On the right he is a small open area of nothing too significant but again this is continuing to give him some trouble he has been were in the Velcro compression that he has at home. 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 He's been tolerating the dressing changes without complication. Fortunately there's no signs of active infection at this time. No fevers, chills, nausea, or vomiting noted at  this time. We have been trying to get an appointment with her lymphedema clinic in [facility] but unfortunately nobody can get them on phone with not been able to even fax information over the patient likewise is not been able to get in touch with them. Overall I'm not sure exactly what's going on here with to reach out again today. 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 Still having a lot of drainage unfortunately. He tells me especially in the left but even on the right. There's no signs of active infection which is good news we've been using so ratcheted up to this point. 05/19/19 on evaluation today patient actually appears to be doing quite well with regard to his left lower extremity which is great news. Fortunately in regard to the right lower extremity has an issues with his wrap and he subsequently did remove this from what I'm understanding. Nonetheless long story short is what he had rewrapped once he removed it subsequently had maggots underneath this wrap whenever he came in for evaluation today. With that being said they were obviously completely cleaned away by the nursing staff. The visit today which is excellent news. However he does appear to potentially have some infection around the right ankle region where the maggots were located as well. He will likely require anabiotic therapy today. 05/26/19 on evaluation today patient actually appears to be doing much better in regard to his bilateral lower extremities. I feel like the infection is under much better control. With that being said there were maggots noted when the wrap was removed yet again today. Again this could have potentially been left over from previous although at this time there does not appear to be any signs of significant drainage there was obviously on the wrap some drainage as well this contracted gnats or  otherwise. Either way I do not see anything that appears to be doing worse in my pinion and in fact I think his drainage has slowed down quite significantly likely mainly due to the fact to his infection being under better control. 06/02/2019 on evaluation today patient actually appears to be doing well with regard to his bilateral lower extremities there is no signs of active infection at this time which is great news. With that being said he does have several open areas more so on the right than the left but nonetheless these are all significantly better than previously noted. 06/09/2019 on evaluation today patient actually appears to be doing well. His wrap stayed up and he did not cause any problems he had more drainage on the right compared to the left but overall I do not see  any major issues at this time which is great news. 06/16/2019 on evaluation today patient appears to be doing excellent with regard to his lower extremities the only area that is open is a new blister that can have opened as of today on the medial ankle on the left. Other than this he really seems to be doing great I see no major issues at this point. 06/23/2019 on evaluation today patient appears to be doing quite well with regard to his bilateral lower extremities. In fact he actually appears to be almost completely healed there is a small area of weeping noted of the right lower extremity just above the ankle. Nonetheless fortunately there is no signs of active infection at this time which is good news. No fevers, chills, nausea, vomiting, or diarrhea. 8/24; the patient arrived for a nurse visit today but complained of very significant pain in the left leg and therefore I was asked to look at this. Noted that he did not have an open area on the left leg last week nevertheless this was wrapped. The patient states that he is not been able to put his compression pumps on the left leg because of the discomfort. He has not been  systemically unwell 06/30/2019 on evaluation today patient unfortunately despite being excellent last week is doing much worse with regard to his left lower extremity today. In fact he had to come in for a nurse on Monday where his left leg had to be rewrapped due to excessive weeping Dr. Leanord Hawking placed him on doxycycline at that point. Fortunately there is no signs of active infection Systemically at this time which is good news. 07/07/2019 in regard to the patient's wounds today he actually seems to be doing well with his right lower extremity there really is nothing open or draining at this point this is great news. Unfortunately the left lower extremity is given him additional trouble at this time. There does not appear to be any signs of active infection nonetheless he does have a lot of edema and swelling noted at this point as well as blistering all of which has led to a much more poor appearing leg at this time compared to where it was 2 weeks ago when it was almost completely healed. Obviously this is a little discouraging for the patient. He is try to contact the lymphedema clinic in [place] he has not been able to get through to them. 07/14/2019 on evaluation today patient actually appears to be doing slightly better with regard to his left lower extremity ulcers. Overall I do feel like at least at the top of the wrap that we have been placing this area has healed quite nicely and looks much better. The remainder of the leg is showing signs of improvement. Unfortunately in the thigh area he still has an open region on the left and again on the right he has been utilizing just a Band-Aid on an area that also opened on the thigh. Again this is an area that were not able to wrap although we did do an Ace wrap to provide some compression that something that obviously is a little less effective than the compression wraps we have been using on the lower portion of the leg. He does have an  appointment with the lymphedema clinic in [facility] on Friday. 07/21/2019 on evaluation today patient appears to be doing better with regard to his lower extremity ulcers. He has been tolerating the dressing changes without complication. Fortunately there is  no signs of active infection at this time. No fevers, chills, nausea, vomiting, or diarrhea. I did receive the paperwork from the physical therapist at the lymphedema clinic in [place]. Subsequently I signed off on that this morning and sent that back to him for further progression with the treatment plan. 07/28/2019 on evaluation today patient appears to be doing very well with regard to his right lower extremity where I do not see any open wounds at this point. Fortunately he is feeling great as far as that is concerned as well. In regard to the left lower extremity he has been having issues with still several areas of weeping and edema although the upper leg is doing better his lower leg still I think is going require the compression wrap at this time. No fevers, chills, nausea, vomiting, or diarrhea. 08/04/2019 on evaluation today patient unfortunately is having new wounds on the right lower extremity. Again we have been using Unna boot wrap on that side. We switched him to using his juxta lite wrap at home. With that being said he tells me he has been using it although his legs extremely swollen and to be honest really does not appear that he has been. I cannot know that for sure however. Nonetheless he has multiple new wounds on the right lower extremity at this time. Obviously we will have to see about getting this rewrapped for him today. 08/11/2019 on evaluation today patient appears to be doing fairly well with regard to his wounds. He has been tolerating the dressing changes including the compression wraps without complication. He still has a lot of edema in his upper thigh regions bilaterally he is supposed to be seeing the  lymphedema clinic on the 15th of this month once his wraps arrive for the upper part of his legs. 08/18/2019 on evaluation today patient appears to be doing well with regard to his bilateral lower extremities at this point. He has been tolerating the dressing changes without complication. Fortunately there is no signs of active infection which is also good news. He does have a couple weeping areas on the first and second toe of the right foot he also has just a small area on the left foot upper leg and a small area on the left lower leg but overall he is doing quite well in my opinion. He is supposed to be getting his wraps shortly in fact tomorrow and then subsequently is seeing the lymphedema clinic next Wednesday on the 21st. Of note he is also leaving on the 25th to go on vacation for a [place] to the beach. For that reason and since there is some uncertainty about what there can be doing at lymphedema clinic next Wednesday I am get a make an appointment for next Friday here for [place] to see what we need to do for him prior to him leaving for vacation. 10/23; patient arrives in considerable pain predominantly in the upper posterior calf just distal to the popliteal fossa also in the wound anteriorly above the major wound. This is probably cellulitis and he has had this recurrently in the past. He has no open wound on the right side and he has had an Radio broadcast assistant in that area. Finally I note that he has an area on the left posterior calf which by enlarge is mostly epithelialized. This protrudes beyond the borders of the surrounding skin in the setting of dry scaly skin and lymphedema. The patient is leaving for [place] on Sunday. Per  his longstanding pattern, he will not take his compression pumps with him predominantly out of fear that they will be stolen. He therefore asked that we put a Unna boot back on the right leg. He will also contact the wound care center in [facility] to see if they can  change his dressing in the mid week. 11/3; patient returned from his vacation to [facility]. He was seen on 1 occasion at their wound care center. They did a 2 layer compression system as they did not have our 4-layer wrap. I am not completely certain what they put on the wounds. They did not change the Unna boot on the right. The patient is also seeing a lymphedema specialist physical therapist in [place]. It appears that he has some compression sleeve for his thighs which indeed look quite a bit better than I am used to seeing. He pumps over these with his external compression pumps. 11/10; the patient has a new wound on the right medial thigh otherwise there is no open areas on the right. He has an area on the left leg posteriorly anteriorly and medially and an area over the left second toe. We have been using silver alginate. He thinks the injury on his thigh is secondary to friction from the compression sleeve he has. 11/17; the patient has a new wound on the right medial thigh last week. He thinks this is because he did not have a underlying stocking for his thigh juxta lite apparatus. He now has this. The area is fairly large and somewhat angry but I do not think he has underlying cellulitis. He has a intact blister on the right anterior tibial area. Small wound on the right great toe dorsally Small area on the medial left calf. 11/30; the patient does not have any open areas on his right leg and we did not take his juxta lite stocking off. However he states that on Friday his compression wrap fell down lodging around his upper mid calf area. As usual this creates a lot of problems for him. He called urgently today to be seen for a nurse visit however the nurse visit turned into a provider visit because of extreme erythema and pain in the left anterior tibia extending laterally and posteriorly. The area that is problematic is extensive 10/06/2019 upon evaluation today patient actually  appears to be doing poorly in regard to his left lower extremity. He Dr. Leanord Hawking did place him on doxycycline this past Monday apparently due to the fact that he was doing much worse in regard to this left leg. Fortunately the doxycycline does seem to be helping. Unfortunately we are still having a very difficult time getting his edema under any type of control in order to anticipate discharge at some point. The only way were really able to control his lymphedema really is with compression wraps and that has only even seemingly temporary. He has been seeing a lymphedema clinic they are trying to help in this regard but still this has been somewhat frustrating in general for the patient. 10/13/19 on evaluation today patient appears to be doing excellent with regard to his right lower extremity as far as the wounds are concerned. His swelling is still quite extensive unfortunately. He is still having a lot of drainage from the thigh areas bilaterally which is unfortunate. He's been going to lymphedema clinic but again he still really does not have this edema under control as far as his lower extremities are concern. With regard  to his left lower extremity this seems to be improving and I do believe the doxycycline has been of benefit for him. He is about to complete the doxycycline. 10/20/2019 on evaluation today patient appears to be doing poorly in regard to his bilateral lower extremities. More in the right thigh he has a lot of irritation at this site unfortunately. In regard to the left lower extremity the wrap was not quite as high it appears and does seem to have caused him some trouble as well. Fortunately there is no evidence of systemic infection though he does have some blue-green drainage which has me concerned for the possibility of Pseudomonas. He tells me he is previously taking Cipro without complications and he really does not care for Levaquin however due to some of the side effects he  has. He is not allergic to any medications specifically antibiotics that were aware of. 10/27/2019 on evaluation today patient actually does appear to be for the most part doing better when compared to last week's evaluation. With that being said he still has multiple open wounds over the bilateral lower extremities. He actually forgot to start taking the Cipro and states that he still has the whole bottle. He does have several new blisters on left lower extremity today I think I would recommend he go ahead and take the Cipro based on what I am seeing at this point. 12/30-Patient comes at 1 week visit, 4 layer compression wraps on the left and Unna boot on the right, primary dressing Xtrasorb and silver alginate. Patient is taking his Cipro and has a few more days left probably 5-6, and the legs are doing better. He states he is using his compressions devices which I believe he has 11/10/2019 on evaluation today patient actually appears to be much better than last time I saw him 2 weeks ago. His wounds are significantly improved and overall I am very pleased in this regard. Fortunately there is no signs of active infection at this time. He is just a couple of days away from completing Cipro. Overall his edema is much better he has been using his lymphedema pumps which I think is also helping at this point. 11/17/2019 on evaluation today patient appears to be doing excellent in regard to his wounds in general. His legs are swollen but not nearly as much as they have been in the past. Fortunately he is tolerating the compression wraps without complication. No fevers, chills, nausea, vomiting, or diarrhea. He does have some erythema however in the distal portion of his right lower extremity specifically around the forefoot and toes there is a little bit of warmth here as well. 11/24/2019 on evaluation today patient appears to be doing well with regard to his right lower extremity I really do not see any open  wounds at this point. His left lower extremity does have several open areas and his right medial thigh also is open. Other than this however overall the patient seems to be making good progress and I am very pleased at this point. 12/01/2019 on evaluation today patient appears to be doing poorly at this point in regard to his left lower extremity has several new blisters despite the fact that we have him in compression wraps. In fact he had a 4-layer compression wrap, his upper thigh wrapped from lymphedema clinic, and a juxta light over top of the 4 layer compression wrap the lymphedema clinic applied and despite all this he still develop blisters underneath. Obviously this does  have me concerned about the fact that unfortunately despite what we are doing to try to get wounds healed he continues to have new areas arise I do not think he is ever good to be at the point where he can realistically just use wraps at home to keep things under control. Typically when we heal him it takes about 1-2 days before he is back in the clinic with severe breakdown and blistering of his lower extremities bilaterally. This is happened numerous times in the past. Unfortunately I think that we may need some help as far as overall fluid overload to kind of limit what we are seeing and get things under better control. 12/08/2019 on evaluation today patient presents for follow-up concerning his ongoing bilateral lower extremity edema. Unfortunately he is still having quite a bit of swelling the compression wraps are controlling this to some degree but he did see Dr. Debara Pickett his cardiologist I do have that available for review today as far as the appointment was concerned that was on 12/06/2019. Obviously that she has been 2 days ago. The patient states that he is only been taking the Lasix 80 mg 1 time a day he had told me previously he was taking this twice a day. Nonetheless Dr. Debara Pickett recommended this be up to 80 mg 2 times a  day for the patient as he did appear to be fluid overloaded. With that being said the patient states he did this yesterday and he was unable to go anywhere or do anything due to the fact that he was constantly having to urinate. Nonetheless I think that this is still good to be something that is important for him as far as trying to get his edema under control at all things that he is going to be able to just expect his wounds to get under control and things to be better without going through at least a period of time where he is trying to stabilize his fluid management in general and I think increasing the Lasix is likely the first step here. It was also mentioned the possibility that the patient may require metolazone. With that being said he wanted to have the patient take Lasix twice a day first and then reevaluating 2 months to see where things stand. 12/15/2019 upon evaluation today patient appears to be doing regard to his legs although his toes are showing some signs of weeping especially on the left at this point to some degree on the right. There does not appear to be any signs of active infection and overall I do feel like the compression wraps are doing well for him but he has not been able to take the Lasix at home and the increased dose that Dr. Debara Pickett recommended. He tells me that just not go to be feasible for him. Nonetheless I think in this case he should probably send a message to Dr. Debara Pickett in order to discuss options from the standpoint of possible admission to get the fluid off or otherwise going forward. 12/22/2019 upon evaluation today patient appears to be doing fairly well with regard to his lower extremities at this point. In fact he would be doing excellent if it was not for the fact that his right anterior thigh apparently had an allergic reaction to adhesive tape that he used. The wound itself that we have been monitoring actually appears to be healed. There is a lot of  irritation at this point. 12/29/2019 upon evaluation today patient appears to  be doing well in regard to his lower extremities. His left medial thigh is open and somewhat draining today but this is the only region that is open the right has done much better with the treatment utilizing the steroid cream that I prescribed for him last week. Overall I am pleased in that regard. Fortunately there is no signs of active infection at this time. No fevers, chills, nausea, vomiting, or diarrhea. 01/05/2020 upon evaluation today patient appears to be doing more poorly in regard to his right lower extremity at this point upon evaluation today. Unfortunately he continues to have issues in this regard and I think the biggest issue is controlling his edema. This obviously is not very well controlled at this point is been recommended that he use the Lasix twice a day but he has not been able to do that. Unfortunately I think this is leading to an issue where honestly he is not really able to effectively control his edema and therefore the wounds really are not doing significantly better. I do not think that he is going to be able to keep things under good control unless he is able to control his edema much better. I discussed this again in great detail with him today. 01/12/2020 good news is patient actually appears to be doing quite well today at this point. He does have an appointment with lymphedema clinic tomorrow. His legs appear healed and the toe on the left is almost completely healed. In general I am very pleased with how things stand at this point. 01/19/2020 upon evaluation today patient appears to actually be doing well in regard to his lower extremities there is nothing open at this point. Fortunately he has done extremely well more recently. Has been seeing lymphedema clinic as well. With that being said he has Velcro wraps for his lower legs as well as his upper legs. The only wound really is on his toe  which is the right great toe and this is barely anything even there. With all that being said I think it is good to be appropriate today to go ahead and switch him over to the Velcro compression wraps. 01/26/2020 upon evaluation today patient appears to be doing worse with regard to his lower extremities after last week switch him to Velcro compression wraps. Unfortunately he lasted less than 24 hours he did not have the sock portion of his Velcro wrap on the left leg and subsequently developed a blister underneath the Velcro portion. Obviously this is not good and not what we were looking for at this point. He states the lymphedema clinic did tell him to wear the wrap for 23 hours and take him off for 1 I am okay with that plan but again right now we got a get things back under control again he may have some cellulitis noted as well. 02/02/2020 upon evaluation today patient unfortunately appears to have several areas of blistering on his bilateral lower extremities today mainly on the feet. His legs do seem to be doing somewhat better which is good news. Fortunately there is no evidence of active infection at this time. No fevers, chills, nausea, vomiting, or diarrhea. 02/16/2020 upon evaluation today patient appears to be doing well at this time with regard to his legs. He has a couple weeping areas on his toes but for the most part everything is doing better and does appear to be sealed up on his legs which is excellent news. We can continue with wrapping him  at this point as he had every time we discontinue the wraps he just breaks out with new wounds. There is really no point in is going forward with this at this point. 03/08/2020 upon evaluation today patient actually appears to be doing quite well with regard to his lower extremity ulcers. He has just a very superficial and really almost nonexistent blister on the left lower extremity he has in general done very well with the compression wraps. With  that being said I do not see any signs of infection at this time which is good news. 03/29/2020 upon evaluation today patient appears to be doing well with regard to his wounds currently except for where he had several new areas that opened up due to some of the wrap slipping and causing him trouble. He states he did not realize they had slipped. Nonetheless he has a 1 area on the right and 3 new areas on the left. Fortunately there is no signs of active infection at this time which is great news. 04/05/2020 upon evaluation today patient actually appears to be doing quite well in general in regard to his legs currently. Fortunately there is no signs of active infection at this time. No fevers, chills, nausea, vomiting, or diarrhea. He tells me next week that he will actually be seen in the lymphedema clinic on Thursday at 10 AM I see him on Wednesday next week. 04/12/2020 upon evaluation today patient appears to be doing very well with regard to his lower extremities bilaterally. In fact he does not appear to have any open wounds at this point which is good news. Fortunately there is no signs of active infection at this time. No fevers, chills, nausea, vomiting, or diarrhea. 04/19/2020 upon evaluation today patient appears to be doing well with regard to his wounds currently on the bilateral lower extremities. There does not appear to be any signs of active infection at this time. Fortunately there is no evidence of systemic infection and overall very pleased at this point. Nonetheless after I held him out last week he literally had blisters the next morning already which swelled up with him being right back here in the clinic. Overall I think that he is just not can be able to be discharged with his legs the way they are he is much to volume overloaded as far as fluid is concerned and that was discussed with him today of also discussed this but should try the clinic nurse manager as well as Dr.  Leanord Hawking. 04/26/2020 upon evaluation today patient appears to be doing better with regard to his wounds currently. He is making some progress and overall swelling is under good control with the compression wraps. Fortunately there is no evidence of active infection at this time. 05/10/2020 on evaluation today patient appears to be doing overall well in regard to his lower extremities bilaterally. He is Tolerating the compression wraps without complication and with what we are seeing currently I feel like that he is making excellent progress. There is no signs of active infection at this time. 05/24/2020 upon evaluation today patient appears to be doing well in regard to his legs. The swelling is actually quite a bit down compared to where it has been in the past. Fortunately there is no sign of active infection at this time which is also good news. With that being said he does have several wounds on his toes that have opened up at this point. 05/31/2020 upon evaluation today patient appears to  be doing well with regard to his legs bilaterally where he really has no significant fluid buildup at this point overall he seems to be doing quite well. Very pleased in this regard. With regard to his toes these also seem to be drying up which is excellent. We have continue to wrap him as every time we tried as a transition to the juxta light wraps things just do not seem to get any better. 06/07/2020 upon evaluation today patient appears to be doing well with regard to his right leg at this point. Unfortunately left leg has a lot of blistering he tells me the wrap started to slide down on him when he tried to put his other Velcro wrap over top of it to help keep things in order but nonetheless still had some issues. 06/14/2020 on evaluation today patient appears to be doing well with regard to his lower extremity ulcers and foot ulcers at this point. I feel like everything is actually showing signs of improvement which  is great news overall there is no signs of active infection at this time. No fevers, chills, nausea, vomiting, or diarrhea. 06/21/2020 on evaluation today patient actually appears to be doing okay in regard to his wounds in general. With that being said the biggest issue I see is on his right foot in particular the first and second toe seem to be doing a little worse due to the fact this is staying very wet. I think he is probably getting need to change out his dressings a couple times in between each week when we see him in regard to his toes in order to keep this drier based on the location and how this is proceeding. Electronic Signature(s) Signed: 06/21/2020 11:09:46 AM By: Lenda Kelp PA-C Entered By: Lenda Kelp on 06/21/2020 11:09:46 -------------------------------------------------------------------------------- Physical Exam Details Patient Name: Date of Service: [facility], [name] 06/21/2020 10:15 A M Medical Record Number: [id number] Patient Account Number: [id number] Date of Birth/Sex: Treating RN: November 02, 1951 (69 y.o. [name] Primary Care Provider: Marney Setting, [name] Other Clinician: Referring Provider: Treating Provider/Extender: Lenda Kelp [facility] [name], [name] in Treatment: 230 Constitutional Obese and well-hydrated in no acute distress. Respiratory normal breathing without difficulty. Psychiatric this patient is able to make decisions and demonstrates good insight into disease process. Alert and Oriented x 3. pleasant and cooperative. Notes Patient's wounds again in general seem to be doing quite well which is great news. With that being said I did discuss with the patient currently that with regard to his toes I think he is getting need to change his dressings more frequently on at least probably 2 occasions in between when we see him in order to keep the moisture down and prevent this from worsening. Electronic Signature(s) Signed: 06/21/2020 11:11:05  AM By: Lenda Kelp PA-C Entered By: Lenda Kelp on 06/21/2020 11:11:04 -------------------------------------------------------------------------------- Physician Orders Details Patient Name: Date of Service: [name], [name] 06/21/2020 10:15 A M Medical Record Number: [id number] Patient Account Number: [id number] Date of Birth/Sex: Treating RN: 08/19/1951 (69 y.o. 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 - with Leonard Schwartz Return A Nurse Visit: - 1 week Dressing Change Frequency Do not change entire dressing for one week. - both legs Wound #177 Right T Great oe Change Dressing every other day. Wound #178 Right T Second oe Change Dressing every other day. Skin Barriers/Peri-Wound Care Moisturizing lotion - both legs and feet and toes Wound Cleansing May shower with protection. Primary Wound Dressing Wound #177 Right T Great oe Calcium Alginate with Silver Wound #178 Right T Second oe Calcium Alginate with Silver Wound #180 Left,Proximal,Dorsal Foot Calcium Alginate with Silver Secondary Dressing Wound #177 Right T Great oe Kerlix/Rolled Gauze Dry Gauze Wound #178 Right T Second oe Kerlix/Rolled Gauze Dry Gauze Wound #180 Left,Proximal,Dorsal  Foot Dry Gauze Edema Control 4 layer compression: Left lower extremity Unna Boot to Right Lower Extremity Avoid standing for long periods of time Elevate legs to the level of the heart or above for 30 minutes daily and/or when sitting, a frequency of: - throughout the day Exercise regularly Electronic Signature(s) Signed: 06/21/2020 5:00:27 PM By: Lenda Kelp PA-C Entered By: Lenda Kelp on 06/21/2020 11:13:30 -------------------------------------------------------------------------------- Problem List Details Patient Name: Date of Service: [name], [name] 06/21/2020 10:15 A M Medical Record Number: [id number] Patient Account Number: [id number] Date of Birth/Sex: Treating RN: 02-Mar-1951 (69 y.o. [name] Primary Care Provider: Marney Setting, [name] Other Clinician: Referring Provider: Treating Provider/Extender: Suzy Bouchard, [name] in Treatment: 230 Active Problems ICD-10 Encounter Code Description Active Date MDM Diagnosis 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 skin 06/30/2018 No Yes L97.221 Non-pressure chronic ulcer of left calf limited to breakdown of skin 09/30/2016 No Yes 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 of 01/22/2016 No Yes bilateral lower extremity I89.0 Lymphedema, not elsewhere classified 01/22/2016 No Yes E11.622 Type 2 diabetes mellitus with other skin ulcer 01/22/2016 No Yes 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, unspecified 01/22/2016 No Yes L03.116 Cellulitis of left lower limb 04/01/2017 No Yes Inactive Problems ICD-10 Code Description Active Date Inactive Date 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 skin 06/30/2017 06/30/2017 L97.521 Non-pressure chronic ulcer of other 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 of skin 04/27/2018 04/27/2018 L03.115 Cellulitis of right lower limb 12/22/2017 12/22/2017 L97.228  Non-pressure chronic ulcer of left calf with other specified severity 06/30/2018 06/30/2018 L97.511 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 of skin 06/30/2018 06/30/2018 Resolved Problems Electronic Signature(s) Signed: 06/21/2020 10:15:07 AM By: Lenda Kelp PA-C Entered By: Lenda Kelp on 06/21/2020 10:15:05 -------------------------------------------------------------------------------- Progress Note Details Patient Name: Date of Service: [name], [name] 06/21/2020 10:15 A M Medical Record Number: [id number] Patient Account Number: [id number] Date of Birth/Sex: Treating RN: [birth year] (69 y.o. [name] Primary Care Provider: Marney Setting, [name] Other Clinician: Referring Provider: Treating Provider/Extender: Lenda Kelp MCGO [name], [name] in Treatment: 230 Subjective Chief Complaint Information obtained from Patient patient is here for evaluation venous/lymphedema weeping History of Present Illness (HPI) Referred by PCP for consultation. Patient has long standing history of BLE venous stasis, no prior ulcerations. At beginning of month, developed cellulitis and weeping. Received IM Rocephin followed by Keflex and resolved. Wears compression stocking, appr 6 months old. Not sure  strength. No present drainage. 01/22/16 this is a patient who is a type II diabetic on insulin. 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 He tells me he religiously wears pressure stockings of uncertain strength. He was here with weeping edema about 8 months ago but did not have an open wound. Roughly a month ago he had a reopening on his bilateral legs. He is been using bandages and Neosporin. He does not complain of pain. 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 Last BUN/creatinine I have is from 11/20/15 at 13 and 1.0  respectively 01/29/16; patient arrives today having tolerated the Profore wrap. He brought in his stockings and these are 18 mmHg stockings he bought from [place]. The compression here is likely inadequate. He does not complain of pain or excessive drainage she has no systemic symptoms. The wound on the right looks improved as does the one on the left although one on the left is more substantial with still tissue at risk below the actual wound area on the bilateral posterior calf 02/05/16; patient arrives with poor edema control. He states that we did put a 4 layer compression on it last week. No weight appear 5 this. 02/12/16; the area on the posterior right Has healed. The left Has a substantial wound that has necrotic surface eschar that requires a debridement with a curette. 02/16/16;the patient called or a Nurse visit secondary to increased swelling. He had been in earlier in the week with his right leg healed. He was transitioned to is on pressure stocking on the right leg with the only open wound on the left, a substantial area on the left posterior calf. Note he has a history of severe lower extremity edema, he has a history of chronic atrial fibrillation but not heart failure per my notes but I'll need to research this. He is not complaining of chest pain shortness of breath or orthopnea. The intake nurse noted blisters on the previously closed right leg 02/19/16; this is the patient's regular visit day. I see him on Friday with escalating edema new wounds on the right leg and clear signs of at least right ventricular heart failure. I increased his Lasix to 40 twice a day. He is returning currently in follow-up. States he is noticed a decrease in that the edema 02/26/16 patient's legs have much less edema. There is nothing really open on the right leg. The left leg has improved condition of the large superficial wound on the posterior left leg 03/04/16; edema control is very much better. The  patient's right leg wounds have healed. On the left leg he continues to have severe venous inflammation on the posterior aspect of the left leg. There is no tenderness and I don't think any of this is cellulitis. 03/11/16; patient's right leg is married healed and he is in his own stocking. The patient's left leg has deteriorated somewhat. There is a lot of erythema around the wound on the posterior left leg. There is also a significant rim of erythema posteriorly just above where the wrap would've ended there is a new wound in this location and a lot of tenderness. Can't rule out cellulitis in this area. 03/15/16; patient's right leg remains healed and he is in his own stocking. The patient's left leg is much better than last review. His major wound on the posterior aspect of his left  Is almost fully epithelialized. He has 3 small injuries from the wraps. Really. Erythema seems a lot better on antibiotics 03/18/16; right leg remains healed and he is in his own stocking. The patient's left leg is much better. The area on the posterior aspect of the left calf is fully epithelialized. His 3 small injuries which were wrap injuries on the left are improved only one seems still open his erythema has resolved 03/25/16; patient's right leg remains healed and he is in his own stocking. There is no open area today on the left leg posterior leg is completely closed up. His wrap injuries at the superior aspect of his leg are also resolved. He looks as though he has some irritation on the dorsal ankle but this is fully epithelialized without evidence of infection. 03/28/16; we discharged this patient on Monday. Transitioned him into his own stocking. There were problems almost immediately with uncontrolled swelling weeping edema multiple some of which have opened. He does not feel systemically unwell in particular no chest pain no shortness of breath and he does not feel 04/08/16; the edema is under better control with  the Profore light wrap but he still has pitting edema. There is one large wound anteriorly 2 on the medial aspect of his left leg and 3 small areas on the superior posterior calf. Drainage is not excessive he is tolerating a Profore light well 04/15/16; put a Profore wrap on him last week. This is controlled is edema however he had a lot of pain on his left anterior foot most of his wounds are healed 04/22/16 once again the patient has denuded areas on the left anterior foot which he states are because his wrap slips up word. He saw his primary physician today is on Lasix 40 twice a day and states that he his weight is down 20 pounds over the last 3 months. 04/29/16: Much improved. left anterior foot much improved. He is now on Lasix 80 mg per day. Much improved edema control 05/06/16; I was hoping to be able to discharge him today however once again he has blisters at a low level of where the compression was placed last week mostly on his left lateral but also his left medial leg and a small area on the anterior part of the left foot. 05/09/16; apparently the patient went home after his appointment on 7/4 later in the evening developing pain in his upper medial thigh together with subjective fever and chills although his temperature was not taken. The pain was so intense he felt he would probably have to call 911. However he then remembered that he had leftover doxycycline from a previous round of antibiotics and took these. By the next morning he felt a lot better. He called and spoke to one of our nurses and I approved doxycycline over the phone thinking that this was in relation to the wounds we had previously seen although they were definitely were not. The patient feels a lot better old fever no chills he is still working. Blood sugars are reasonably controlled 05/13/16; patient is back in for review of his cellulitis on his anterior medial upper thigh. He is taking doxycycline this is a lot better.  Culture I did of the nodular area on the dorsal aspect of his foot grew MRSA this also looks a lot better. 05/20/16; the patient is cellulitis on the medial upper thigh has resolved. All of his wound areas including the left anterior foot, areas on the medial aspect  of the left calf and the lateral aspect of the calf at all resolved. He has a new blister on the left dorsal foot at the level of the fourth toe this was excised. No evidence of infection 05/27/16; patient continues to complain weeping edema. He has new blisterlike wounds on the left anterior lateral and posterior lateral calf at the top of his wrap levels. The area on his left anterior foot appears better. He is not complaining of fever, pain or pruritus in his feet. 05/30/16; the patient's blisters on his left anterior leg posterior calf all look improved. He did not increase the Lasix 100 mg as I suggested because he was going to run out of his 40 mg tablets. He is still having weeping edema of his toes 06/03/16; I renewed his Lasix at 80 mg once a day as he was about to run out when I last saw him. He is on 80 mg of Lasix now. I have asked him to cut down on the excessive amount of water he was drinking and asked him to drink according to his thirst mechanisms 06/12/2016 -- was seen 2 days ago and was supposed to wear his compression stockings at home but he is developed lymphedema and superficial blisters on the left lower extremity and hence came in for a review 06/24/16; the remaining wound is on his left anterior leg. He still has edema coming from between his toes. There is lymphedema here however his edema is generally better than when I last saw this. He has a history of atrial fibrillation but does not have a known history of congestive heart failure nevertheless I think he probably has this at least on a diastolic basis. 07/01/16 I reviewed his echocardiogram from January 2017. This was essentially normal. He did not have LVH, EF  of 55-60%. His right ventricular function was normal although he did have trivial tricuspid and pulmonic regurgitation. This is not audible on exam however. I increased his Lasix to do massive edema in his legs well above his knees I think in early July. He was also drinking an excessive amount of water at the time. 07/15/16; missed his appointment last week because of the Labor Day holiday on Monday. He could not get another appointment later in the week. Started to feel the wrap digging in superiorly so we remove the top half and the bottom half of his wrap. He has extensive erythema and blistering superiorly in the left leg. Very tender. Very swollen. Edema in his foot with leaking edema fluid. He has not been systemically unwell 07/22/16; the area on the left leg laterally required some debridement. The medial wounds look more stable. His wrap injury wounds appear to have healed. Edema and his foot is better, weeping edema is also better. He tells me he is meeting with the supplier of the external compression pumps at work 08/05/16; the patient was on vacation last week in [facility]. His wrap is been on for an extended period of time. Also over the weekend he developed an extensive area of tender erythema across his anterior medial thigh. He took to doxycycline yesterday that he had leftover from a previous prescription. The patient complains of weeping edema coming out of his toes 08/08/16; I saw this patient on 10/2. He was tender across his anterior thigh. I put him on doxycycline. He returns today in follow-up. He does not have any open wounds on his lower leg, he still has edema weeping into his toes.  08/12/16; patient was seen back urgently today to follow-up for his extensive left thigh cellulitis/erysipelas. He comes back with a lot less swelling and erythema pain is much better. I believe I gave him Augmentin and Cipro. His wrap was cut down as he stated a roll down his legs. He developed  blistering above the level of the wrap that remained. He has 2 open blisters and 1 intact. 08/19/16; patient is been doing his primary doctor who is increased his Lasix from 40-80 once a day or 80 already has less edema. Cellulitis has remained improved in the left thigh. 2 open areas on the posterior left calf 08/26/16; he returns today having new open blisters on the anterior part of his left leg. He has his compression pumps but is not yet been shown how to use some vital representative from the supplier. 09/02/16 patient returns today with no open wounds on the left leg. Some maceration in his plantar toes 09/10/2016 -- Dr. Leanord Hawking had recently discharged him on 09/02/2016 and he has come right back with redness swelling and some open ulcers on his left lower extremity. He says this was caused by trying to apply his compression stockings and he's been unable to use this and has not been able to use his lymphedema pumps. He had some doxycycline leftover and he has started on this a few days ago. 09/16/16; there are no open wounds on his leg on the left and no evidence of cellulitis. 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 He does not complain of symptoms here. I am not clear use using his external compression pumps. 09/23/16; I have not seen this patient in 2 weeks. He canceled his appointment 10 days ago as he was going on vacation. He tells me that on Monday he noticed a large area on his posterior left leg which is been draining copiously and is reopened into a large wound. He is been using ABDs and the external part of his juxtalite, according to our nurse this was not on properly. 10/07/16; Still a substantial area on the posterior left leg. Using silver alginate 10/14/16; in general better although there is still open area which looks healthy. Still using silver alginate. He reminds me that this happen before he left for [facility] T oday while he was showering in the morning. He had been using his juxtalite's 10/21/16; the area on his posterior left leg is fully epithelialized. However he arrives today with a large area of tender erythema in his medial and posterior left thigh just above the knee. I have marked the area. Once again he is reluctant to consider hospitalization. I treated him with oral antibiotics in the past for a similar situation with resolution I think with doxycycline however this area it seems more extensive to me. He is not complaining of fever but does have chills and says states he is thirsty. His blood sugar today was in the 140s at home 10/25/16 the area on his posterior left leg is fully epithelialized although there is still some weeping edema. The large area of tenderness and erythema in his medial and posterior left thigh is a lot less tender although there is still a lot of swelling in this thigh. He states he feels a lot better. He is on doxycycline and Augmentin that I started last week. This will continued until Tuesday, December 26. I have ordered a duplex ultrasound of the left thigh rule  out DVT whether there is an abscess something that would need to be drained I would also like to know. 11/01/16; he still has weeping edema from a not fully epithelialized area on his left posterior calf. Most of the rest of this looks a lot better. He has completed his antibiotics. His thigh is a lot better. Duplex ultrasound did not show a DVT in the thigh 11/08/16; he comes in today with more Denuded surface epithelium from the posterior aspect of his calf. There is no real evidence of cellulitis. The superior aspect of his wrap appears to have put quite an indentation in his leg just below the knee and this may have contributed. He does not complain of pain or fever. We have been using silver alginate as the primary dressing. The area of cellulitis in the right thigh has totally resolved. He has been  using his compression stockings once a week 11/15/16; the patient arrives today with more loss of epithelium from the posterior aspect of his left calf. He now has a fairly substantial wound in this area. The reason behind this deterioration isn't exactly clear although his edema is not well controlled. He states he feels he is generally more swollen systemically. He is not complaining of chest pain shortness of breath fever. T me he has an appointment with his primary physician in early February. He is on 80 mg of oral ells Lasix a day. He claims compliance with the external compression pumps. He is not having any pain in his legs similar to what he has with his recurrent cellulitis 11/22/16; the patient arrives a follow-up of his large area on his left lateral calf. This looks somewhat better today. He came in earlier in the week for a dressing change since I saw him a week ago. He is not complaining of any pain no shortness of breath no chest pain 11/28/16; the patient arrives for follow-up of his large area on the left lateral calf this does not look better. In fact it is larger weeping edema. The surface of the wound does not look too bad. We have been using silver alginate although I'm not certain that this is a dressing issue. 12/05/16; again the patient follows up for a large wound on the left lateral and left posterior calf this does not look better. There continues to be weeping edema necrotic surface tissue. More worrisome than this once again there is erythema below the wound involving the distal Achilles and heel suggestive of cellulitis. He is on his feet working most of the day of this is not going well. We are changing his dressing twice a week to facilitate the drainage. 12/12/16; not much change in the overall dimensions of the large area on the left posterior calf. This is very inflamed looking. I gave him an. Doxycycline last week does not really seem to have helped. He found the wrap  very painful indeed it seems to of dog into his legs superiorly and perhaps around the heel. He came in early today because the drainage had soaked through his dressings. 12/19/16- patient arrives for follow-up evaluation of his left lower extremity ulcers. He states that he is using his lymphedema pumps once daily when there is "no drainage". He admits to not using his lipedema pumps while under current treatment. His blood sugars have been consistently between 150-200. 12/26/16; the patient is not using his compression pumps at home because of the wetness on his feet. I've advised him that I think  it's important for him to use this daily. He finds his feet too wet, he can put a plastic bag over his legs while he is in the pumps. Otherwise I think will be in a vicious circle. We are using silver alginate to the major area on his left posterior calf 01/02/17; the patient's posterior left leg has further of all into 3 open wounds. All of them covered with a necrotic surface. He claims to be using his compression pumps once a day. His edema control is marginal. Continue with silver alginate 01/10/17; the patient's left posterior leg actually looks somewhat better. There is less edema, less erythema. Still has 3 open areas covered with a necrotic surface requiring debridement. He claims to be using his compression pumps once a day his edema control is better 01/17/17; the patient's left posterior calf look better last week when I saw him and his wrap was changed 2 days ago. He has noted increasing pain in the left heel and arrives today with much larger wounds extensive erythema extending down into the entire heel area especially tender medially. He is not systemically unwell CBGs have been controlled no fever. Our intake nurse showed me limegreen drainage on his AVD pads. 01/24/17; his usual this patient responds nicely to antibiotics last week giving him Levaquin for presumed Pseudomonas. The whole entire  posterior part of his leg is much better much less inflamed and in the case of his Achilles heel area much less tender. He has also had some epithelialization posteriorly there are still open areas here and still draining but overall considerably better 01/31/17- He has continue to tolerate the compression wraps. he states that he continues to use the lymphedema pumps daily, and can increase to twice daily on the weekends. He is voicing no complaints or concerns regarding his LLE ulcers 02/07/17-he is here for follow-up evaluation. He states that he noted some erythema to the left medial and anterior thigh, which he states is new as of yesterday. He is concerned about recurrent cellulitis. He states his blood sugars have been slightly elevated, this morning in the 180s 02/14/17; he is here for follow-up evaluation. When he was last here there was erythema superiorly from his posterior wound in his anterior thigh. He was prescribed Levaquin however a culture of the wound surface grew MRSA over the phone I changed him to doxycycline on Monday and things seem to be a lot better. 02/24/17; patient missed his appointment on Friday therefore we changed his nurse visit into a physician visit today. Still using silver alginate on the large area of the posterior left thigh. He isn't new area on the dorsal left second toe 03/03/17; actually better today although he admits he has not used his external compression pumps in the last 2 days or so because of work responsibilities over the weekend. 03/10/17; continued improvement. External compression pumps once a day almost all of his wounds have closed on the posterior left calf. Better edema control 03/17/17; in general improved. He still has 3 small open areas on the lateral aspect of his left leg however most of the area on the posterior part of his leg is epithelialized. He has better edema control. He has an ABD pad under his stocking on the right anterior lower leg  although he did not let us look at that today. 03/24/17; patient arrives back in clinic today with no open areas however there are areas on the posterior left calf and anterior left calf that are  less than 100% epithelialized. His edema is well controlled in the left lower leg. There is some pitting edema probably lymphedema in the left upper thigh. He uses compression pumps at home once per day. I tried to get him to do this twice a day although he is very reticent. 04/01/2017 -- for the last 2 days he's had significant redness, tenderness and weeping and came in for an urgent visit today. 04/07/17; patient still has 6 more days of doxycycline. He was seen by Dr. Meyer Russel last Wednesday for cellulitis involving the posterior aspect, lateral aspect of his Involving his heel. For the most part he is better there is less erythema and less weeping. He has been on his feet for 12 hours o2 over the weekend. Using his compression pumps once a day 04/14/17 arrives today with continued improvement. Only one area on the posterior left calf that is not fully epithelialized. He has intense bilateral venous inflammation associated with his chronic venous insufficiency disease and secondary lymphedema. We have been using silver alginate to the left posterior calf wound In passing he tells [place] today that the right leg but we have not seen in quite some time has an open area on it but he doesn't want [place] to look at this today states he will show this to [place] next week. 04/21/17; there is no open area on his left leg although he still reports some weeping edema. He showed [place] his right leg today which is the first time we've seen this leg in a long time. He has a large area of open wound on the right leg anteriorly healthy granulation. Quite a bit of swelling in the right leg and some degree of venous inflammation. He told us about the right leg in passing last week but states that deterioration in the right leg really only  happened over the weekend 04/28/17; there is no open area on the left leg although there is an irritated part on the posterior which is like a wrap injury. The wound on the right leg which was new from last week at least to [place] is a lot better. 05/05/17; still no open area on the left leg. Patient is using his new compression stocking which seems to be doing a good job of controlling the edema. He states he is using his compression pumps once per day. The right leg still has an open wound although it is better in terms of surface area. Required debridement. A lot of pain in the posterior right Achilles marked tenderness. Usually this type of presentation this patient gives concern for an active cellulitis 05/12/17; patient arrives today with his major wound from last week on the right lateral leg somewhat better. Still requiring debridement. He was using his compression stocking on the left leg however that is reopened with superficial wounds anteriorly he did not have an open wound on this leg previously. He is still using his juxta light's once daily at night. He cannot find the time to do this in the morning as he has to be at work by 7 AM 05/19/17; right lateral leg wound looks improved. No debridement required. The concerning area is on the left posterior leg which appears to almost have a subcutaneous hemorrhagic component to it. We've been using silver alginate to all the wounds 05/26/17; the right lateral leg wound continues to look improved. However the area on the left posterior calf is a tightly adherent surface. [place] using silver alginate. Because of the weeping  edema in his legs there is very little good alternatives. 06/02/17; the patient left here last week looking quite good. Major wound on the left posterior calf and a small one on the right lateral calf. Both of these look satisfactory. He tells me that by Wednesday he had noted increased pain in the left leg and drainage. He called on  Thursday and Friday to get an appointment here but we were blocked. He did not go to urgent care or his primary physician. He thinks he had a fever on Thursday but did not actually take his temperature. He has not been using his compression pumps on the left leg because of pain. I advised him to go to the emergency room today for IV antibiotics for stents of left leg cellulitis but he has refused I have asked him to take 2 days off work to keep his leg elevated and he has refused this as well. In view of this I'm going to call him and Augmentin and doxycycline. 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 However after a detailed discussion regarding the risk benefits and alternatives he refuses to get admitted to the hospital. With no other recourse I will continue him on oral antibiotics as before and hopefully he'll have his infectious disease consultation this week. 06/16/2017 -- the patient was seen today by the nurse practitioner at infectious disease Ms. Dixon. 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 He has also been advise Lotrimin cream locally. she also had wise IV antibiotics for his condition if it flares up 06/23/17; patient arrives today with drainage bilaterally although the remaining wound on the left posterior calf after cleaning up today "highlighter yellow drainage" did not look too bad. Unfortunately he has had breakdown on the right anterior leg [previously this leg had not been open and he is using a black stocking] he went to see infectious disease and is been  put on clindamycin 150 mg daily, I did not verify the dose although I'm not familiar with using clindamycin in this dosing range, perhaps for prophylaxisoo 06/27/17; I brought this patient back today to follow-up on the wound deterioration on the right lower leg together with surrounding cellulitis. I started him on doxycycline 4 days ago. This area looks better however he comes in today with intense cellulitis on the medial part of his left thigh. This is not have a wound in this area. Extremely tender. We've been using silver alginate to the wounds on the right lower leg left lower leg with bilateral 4 layer compression he is using his external compression pumps once a day 07/04/17; patient's left medial thigh cellulitis looks better. He has not been using his compression pumps as his insert said it was contraindicated with cellulitis. His right leg continues to make improvements all the wounds are still open. We only have one remaining wound on the left posterior calf. Using silver alginate to all open areas. He is on doxycycline which I started a week ago and should be finishing I gave him Augmentin after Thursday's visit for  the severe cellulitis on the left medial thigh which fortunately looks better 07/14/17; the patient's left medial thigh cellulitis has resolved. The cellulitis in his right lower calf on the right also looks better. All of his wounds are stable to improved we've been using silver alginate he has completed the antibiotics I have given him. He has clindamycin 150 mg once a day prescribed by infectious disease for prophylaxis, I've advised him to start this now. We have been using bilateral Unna boots over silver alginate to the wound areas 07/21/17; the patient is been to see infectious disease who noted his recurrent problems with cellulitis. He was not able to tolerate prophylactic clindamycin therefore he is on amoxicillin 500 twice a day. He also had a second daily dose of Lasix  added By Dr. Oneta Rack but he is not taking this. Nor is he being completely compliant with his compression pumps a especially not this week. He has 2 remaining wounds one on the right posterior lateral lower leg and one on the left posterior medial lower leg. 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 The patient has Unna boots bilaterally. 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 He has bilateral Unna boots for compression and silver alginate to his wounds. Arrives today with his legs looking as good as I have seen him in quite some time. Not surprisingly his wounds look better as well with improvement on the right lateral leg venous insufficiency wound and also the left medial leg. He is still using the compression pumps once a day 08/11/17; both legs appear to be doing better wounds on the right lateral and left medial legs look better. Skin on the right leg quite good. He is been using silver alginate as the primary dressing. I'm going to use Anasept gel calcium alginate and maintain all the secondary dressings 08/18/17; the patient continues to actually do quite well. The area on his right lateral leg is just about closed the left medial also looks better although it is still moist in this area. His edema is well controlled we have been using Anasept gel with calcium alginate and the usual secondary dressings, 4 layer compression and once daily use of his compression pumps "always been able to manage 09/01/17; the patient continues to do reasonably well in spite of his trip to [place]. The area on the right lateral leg is epithelialized. Left is much better but still open. He has more edema and more chronic erythema on the left leg [venous inflammation] 09/08/17; he  arrives today with no open wound on the right lateral leg and decently controlled edema. Unfortunately his left leg is not nearly as in his good situation as last week.he apparently had increasing edema starting on Saturday. He edema soaked through into his foot so used a plastic bag to walk around his home. The area on the medial right leg which was his open area is about the same however he has lost surface epithelium on the left lateral which is new and he has significant pain in the Achilles area of the left foot. He is already on amoxicillin chronically for prophylaxis of cellulitis in the left leg 09/15/17; he is completed a week of doxycycline and the cellulitis in the left posterior leg and Achilles area is as  usual improved. He still has a lot of edema and fluid soaking through his dressings. There is no open wound on the right leg. He saw infectious disease NP today 09/22/17;As usual 1 we transition him from our compression wraps to his stockings things did not go well. He has several small open areas on the right leg. He states this was caused by the compression wrap on his skin although he did not wear this with the stockings over them. He has several superficial areas on the left leg medially laterally posteriorly. He does not have any evidence of active cellulitis especially involving the left Achilles [facility] [place] [facility] [place] [facility] [place] [facility] [place] He states he isn't attempting to get an appointment with a heel objects wound center there to change his dressings. I am not completely certain whether this will work 10/06/17; the patient came in on Friday for a nurse visit and the nurse reported that his legs actually look quite good. He arrives in clinic today for his regular follow-up visit. He has a new wound on his left third toe over the PIP probably caused by friction with his footwear. He has small areas on the left leg and a very superficial but epithelialized  area on the right anterior lateral lower leg. Other than that his legs look as good as I've seen him in quite some time. We have been using silver alginate Review of systems; no chest pain no shortness of breath other than this a 10 point review of systems negative 10/20/17; seen by Dr. Meyer RusselBritto last week. He had taken some antibiotics [doxycycline] that he had left over. Dr. Meyer RusselBritto thought he had candida infection and declined to give him further antibiotics. He has a small wound remaining on the right lateral leg several areas on the left leg including a larger area on the left posterior several left medial and anterior and a small wound on the left lateral. The area on the left dorsal third toe looks a lot better. ROS; Gen.; no fever, respiratory no cough no sputum Cardiac no chest pain other than this 10 point review of system is negative 10/30/17; patient arrives today having fallen in the bathtub 3 days ago. It took him a while to get up. He has pain and maceration in the wounds on his left leg which have deteriorated. He has not been using his pumps he also has some maceration on the right lateral leg. 11/03/17; patient continues to have weeping edema especially in the left leg. This saturates his dressings which were just put on on 12/27. As usual the doxycycline seems to take care of the cellulitis on his lower leg. He is not complaining of fever, chills, or other systemic symptoms. He states his leg feels a lot better on the doxycycline I gave him empirically. He also apparently gets injections at his primary doctor's officeo Rocephin for cellulitis prophylaxis. I didn't ask him about his compression pump compliance today I think that's probably marginal. Arrives in the clinic with all of his dressings primary and secondary macerated full of fluid and he has bilateral edema 11/10/17; the patient's right leg looks some better although there is still a cluster of wounds on the right lateral. The  left leg is inflamed with almost circumferential skin loss medially to laterally although we are still maintaining anteriorly. He does not have overt cellulitis there is a lot of drainage. He is not using compression pumps. We have been using silver alginate to  the wound areas, there are not a lot of options here 11/17/17; the patient's right leg continues to be stable although there is still open wounds, better than last week. The inflammation in the left leg is better. Still loss of surface layer epithelium especially posteriorly. There is no overt cellulitis in the amount of edema and his left leg is really quite good, tells me he is using his compression pumps once a day. 11/24/17; patient's right leg has a small superficial wound laterally this continues to improve. The inflammation in the left leg is still improving however we have continuous surface layer epithelial loss posteriorly. There is no overt cellulitis in the amount of edema in both legs is really quite good. He states he is using his compression pumps on the left leg once a day for 5 out of 7 days 12/01/17; very small superficial areas on the right lateral leg continue to improve. Edema control in both legs is better today. He has continued loss of surface epithelialization and left posterior calf although I think this is better. We have been using silver alginate with large number of absorptive secondary dressings 4 layer on the left Unna boot on the right at his request. He tells me he is using his compression pumps once a day 12/08/17; he has no open area on the right leg is edema control is good here. ooOn the left leg however he has marked erythema and tenderness breakdown of skin. He has what appears to be a wrap injury just distal to the popliteal fossa. This is the pattern of his recurrent cellulitis area and he apparently received penicillin at his primary physician's office really worked in my view but usually response to  doxycycline given it to him several times in the past 12/15/17; the patient had already deteriorated last Friday when he came in for his nurse check. There was swelling erythema and breakdown in the right leg. He has much worse skin breakdown in the left leg as well multiple open areas medially and posteriorly as well as laterally. He tells me he has been using his compression pumps but tells me he feels that the drainage out of his leg is worse when he uses a compression pumps. T be fair to him he is been saying this o for a while however I don't know that I have really been listening to this. I wonder if the compression pumps are working properly 12/22/17;. Once again he arrives with severe erythema, weeping edema from the left greater than right leg. Noncompliance with compression pumps. New this visit he is complaining of pain on the lateral aspect of the right leg and the medial aspect of his right thigh. He apparently saw his cardiologist Dr. [name] who was ordered an echocardiogram area and I think this is a step in the right direction 12/25/17; started his doxycycline Monday night. There is still intense erythema of the right leg especially in the anterior thigh although there is less tenderness. The erythema around the wound on the right lateral calf also is less tender. He still complaining of pain in the left heel. His wounds are about the same right lateral left medial left lateral. Superficial but certainly not close to closure. He denies being systemically unwell no fever chills no abdominal pain no diarrhea 12/29/17; back in follow-up of his extensive right calf and right thigh cellulitis. I added amoxicillin to cover possible doxycycline resistant strep. This seems to of done the trick he is in much  less pain there is much less erythema and swelling. He has his echocardiogram at 11:00 this morning. X-ray of the left heel was also negative. 01/05/18; the patient arrived with his edema under  much better control. Now that he is retired he is able to use his compression pumps daily and sometimes twice a day per the patient. He has a wound on the right leg the lateral wound looks better. Area on the left leg also looks a lot better. He has no evidence of cellulitis in his bilateral thighs I had a quick peak at his echocardiogram. He is in normal ejection fraction and normal left ventricular function. He has moderate pulmonary hypertension moderately reduced right ventricular function. One would have to wonder about chronic sleep apnea although he says he doesn't snore. He'll review the echocardiogram with his cardiologist. 01/12/18; the patient arrives with the edema in both legs under exemplary control. He is using his compression pumps daily and sometimes twice daily. His wound on the right lateral leg is just about closed. He still has some weeping areas on the posterior left calf and lateral left calf although everything is just about closed here as well. I have spoken with Aldean Baker who is the patient's nurse practitioner and infectious disease. She was concerned that the patient had not understood that the parenteral penicillin injections he was receiving for cellulitis prophylaxis was actually benefiting him. I don't think the patient actually saw that I would tend to agree we were certainly dealing with less infections although he had a serious one last month. 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 He is healed. He'll be placed in juxtalite compression wraps and increase his lymphedema pumps to twice daily. We will follow up again next week to ensure there are no issues with the new regiment. 01/20/18-he is here for evaluation of bilateral lower extremity weeping edema. Yesterday he was placed in compression wrap to the right lower extremity and compression stocking to left lower shrubbery. He states he uses lymphedema pumps last night and  again this morning and noted a blister to the left lower extremity. On exam he was noted to have drainage to the right lower extremity. He will be placed in Unna boots bilaterally and follow-up next week 01/26/18; patient was actually discharged a week ago to his own juxta light stockings only to return the next day with bilateral lower extremity weeping edema.he was placed in bilateral Unna boots. He arrives today with pain in the back of his left leg. There is no open area on the right leg however there is a linear/wrap injury on the left leg and weeping edema on the left leg posteriorly. I spoke with infectious disease about 10 days ago. They were disappointed that the patient elected to discontinue prophylactic intramuscular penicillin shots as they felt it was particularly beneficial in reducing the frequency of his cellulitis. I discussed this with the patient today. He does not share this view. He'll definitely need antibiotics today. Finally he is traveling to [place] and trauma leaving this Saturday and returning a week later and he does not travel with his pumps. He is going by car 01/30/18; patient was seen 4 days ago and brought back in today for review of cellulitis in the left leg posteriorly. I put him on amoxicillin this really hasn't helped as much as I might like. He is also worried because he is traveling to [facility] trauma by car. Finally we will be  rewrapping him. There is no open area on the right leg over his left leg has multiple weeping areas as usual 02/09/18; The same wrap on for 10 days. He did not pick up the last doxycycline I prescribed for him. He apparently took 4 days worth he already had. There is nothing open on his right leg and the edema control is really quite good. He's had damage in the left leg medially and laterally especially probably related to the prolonged use of Unna boots 02/12/18; the patient arrived in clinic today for a nurse visit/wrap change. He  complained of a lot of pain in the left posterior calf. He is taking doxycycline that I previously prescribed for him. Unfortunately even though he used his stockings and apparently used to compression pumps twice a day he has weeping edema coming out of the lateral part of his right leg. This is coming from the lower anterior lateral skin area. 02/16/18; the patient has finished his doxycycline and will finish the amoxicillin 2 days. The area of cellulitis in the left calf posteriorly has resolved. He is no longer having any pain. He tells me he is using his compression pumps at least once a day sometimes twice. 02/23/18; the patient finished his doxycycline and Amoxil last week. On Friday he noticed a small erythematous circle about the size of a quarter on the left lower leg just above his ankle. This rapidly expanded and he now has erythema on the lateral and posterior part of the thigh. This is bright red. Also has an area on the dorsal foot just above his toes and a tender area just below the left popliteal fossa. He came off his prophylactic penicillin injections at his own insistence one or 2 months ago. This is obviously deteriorated since then 03/02/18; patient is on doxycycline and Amoxil. Culture I did last week of the weeping area on the back of his left calf grew group B strep. I have therefore renewed the amoxicillin 500 3 times a day for a further week. He has not been systemically unwell. Still complaining of an area of discomfort right under his left popliteal fossa. There is no open wound on the right leg. He tells me that he is using his pumps twice a day on most days 03/09/18; patient arrives in clinic today completing his amoxicillin today. The cellulitis on his left leg is better. Furthermore he tells me that he had intramuscular penicillin shots that his primary care office today. However he also states that the wrap on his right leg fell down shortly after leaving clinic last week.  He developed a large blister that was present when he came in for a nurse visit later in the week and then he developed intense discomfort around this area.He tells me he is using his compression pumps 03/16/18; the patient has completed his doxycycline. The infectious part of this/cellulitis in the left heel area left popliteal area is a lot better. He has 2 open areas on the right calf. Still areas on the left calf but this is a lot better as well. 03/24/18; the patient arrives complaining of pain in the left popliteal area again. He thinks some of this is wrap injury. He has no open area on the right leg and really no open area on the left calf either except for the popliteal area. He claims to be compliant with the compression pumps 03/31/18; I gave him doxycycline last week because of cellulitis in the left popliteal area. This  is a lot better although the surface epithelium is denuded off and response to this. He arrives today with uncontrolled edema in the right calf area as well as a fingernail injury in the right lateral calf. There is only a few open areas on the left 04/06/18; I gave him amoxicillin doxycycline over the last 2 weeks that the amoxicillin should be completing currently. He is not complaining of any pain or systemic symptoms. The only open areas see has is on the right lateral lower leg paradoxically I cannot see anything on the left lower leg. He tells me he is using his compression pumps twice a day on most days. Silver alginate to the wounds that are open under 4 layer compression 04/13/18; he completed antibiotics and has no new complaints. Using his compression pumps. Silver alginate that anything that's opened 04/20/18; he is using his compression pumps religiously. Silver alginate 4 layer compression anything that's opened. He comes in today with no open wounds on the left leg but 3 on the right including a new one posteriorly. He has 2 on the right lateral and one on the  right posterior. He likes Unna boots on the right leg for reasons that aren't really clear we had the usual 4 layer compression on the left. It may be necessary to move to the 4 layer compression on the right however for now I left them in the Unna boots 04/27/18; he is using his compression pumps at least once a day. He has still the wounds on the right lateral calf. The area right posteriorly has closed. He does not have an open wound on the left under 4 layer compression however on the dorsal left foot just proximal to the toes and the left third toe 2 small open areas were identified 05/11/18; he has not uses compression pumps. The areas on the right lateral calf have coalesced into one large wound necrotic surface. On the left side he has one small wound anteriorly however the edema is now weeping out of a large part of his left leg. He says he wasn't using his pumps because of the weeping fluid. I explained to him that this is the time he needs to pump more 05/18/18; patient states he is using his compression pumps twice a day. The area on the right lateral large wound albeit superficial. On the left side he has innumerable number of small new wounds on the left calf particularly laterally but several anteriorly and medially. All these appear to have healthy granulated base these look like the remnants of blisters however they occurred under compression. The patient arrives in clinic today with his legs somewhat better. There is certainly less edema, less multiple open areas on the left calf and the right anterior leg looks somewhat better as well superficial and a little smaller. However he relates pain and erythema over the last 3-4 days in the thigh and I looked at this today. He has not been systemically unwell no fever no chills no change in blood sugar values 05/25/18; comes in today in a better state. The severe cellulitis on his left leg seems better with the Keflex. Not as tender. He has not  been systemically unwell ooHard to find an open wound on the left lower leg using his compression pumps twice a day ooThe confluent wounds on his right lateral calf somewhat better looking. These will ultimately need debridement I didn't do this today. 06/01/18; the severe cellulitis on the left anterior thigh has  resolved and he is completed his Keflex. ooThere is no open wound on the left leg however there is a superficial excoriation at the base of the third toe dorsally. Skin on the bottom of his left foot is macerated looking. ooThe left the wounds on the lateral right leg actually looks some better although he did require debridement of the top half of this wound area with an open curet 06/09/18 on evaluation today patient appears to be doing poorly in regard to his right lower extremity in particular this appears to likely be infected he has very thick purulent discharge along with a bright green tent to the discharge. This makes me concerned about the possibility of pseudomonas. He's also having increased discomfort at this point on evaluation. Fortunately there does not appear to be any evidence of infection spreading to the other location at this time. 06/16/18 on evaluation today patient appears to actually be doing fairly well. His ulcer has actually diminished in size quite significantly at this point which is good news. Nonetheless he still does have some evidence of infection he did see infectious disease this morning before coming here for his appointment. I did review the results of their evaluation and their note today. They did actually have him discontinue the Cipro and initiate treatment with linezolid at this time. He is doing this for the next seven days and they recommended a follow-up in four months with them. He is the keep a log of the need for intermittent antibiotic therapy between now and when he falls back up with infectious disease. This will help them gaze what exactly  they need to do to try and help them out. 06/23/18; the patient arrives today with no open wounds on the left leg and left third toe healed. He is been using his compression pumps twice a day. On the right lateral leg he still has a sizable wound but this is a lot better than last time I saw this. 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 Has been using silver alginate under 4 layer compression 06/30/18; the only open wound he has is on the right lateral leg and this looks healthy. No debridement is required. We have been using silver alginate. He does not have an open wound on the left leg. There is apparently some drainage from the dorsal proximal third toe on the left although I see no open wound here. 07/03/18 on evaluation today patient was actually here just for a nurse visit rapid change. However when he was here on Wednesday for his rat change due to having been healed on the left and then developing blisters we initiated the wrap again knowing that he would be back today for [place] to reevaluate and see were at. Unfortunately he has developed some cellulitis into the proximal portion of his right lower extremity even into the region of his thigh. 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 He was placed on one as what at that point. Nonetheless he is done with that and has been tolerating it well otherwise. Doxycycline which in the past really did not seem to be effective for him. Nonetheless I think the best option may be for [place] to definitely reinitiate the antibiotics for a longer period of time. 07/07/18; since I last saw this patient a week ago he has had a  difficult time. At that point he did not have an open wound on his left leg. We transitioned him into juxta light stockings. He was apparently in the clinic the next day with blisters on the left  lateral and left medial lower calf. He also had weeping edema fluid. He was put back into a compression wrap. He was also in the clinic on Friday with intense erythema in his right thigh. Per the patient he was started on Bactrim however that didn't work at all in terms of relieving his pain and swelling. He has taken 3 doxycycline that he had left over from last time and that seems to of helped. He has blistering on the right thigh as well. 07/14/18; the erythema on his right thigh has gotten better with doxycycline that he is finishing. 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 Presumably this cellulitis in the thigh was not related to that although I think this is a bit concerning going forward. He still has an area on the right lateral calf the blister on the right medial calf just below the knee that was discussed above. On the left 2 small open areas left medial and left lateral. Edema control is adequate. He is using his compression pumps twice a day 07/20/18; continued improvement in the condition of both legs especially the edema in his bilateral thighs. He tells me he is been losing weight through a combination of diet and exercise. He is using his compression pumps twice a day. So overall she made to the remaining wounds 07/27/2018; continued improvement in condition of both legs. His edema is well controlled. The area on the right lateral leg is just about closed he had one blisters show up on the medial left upper calf. We have him in 4 layer compression. He is going on a 10-day trip to [place], [place] and [place]. He will be driving. He wants to wear Unna boots because of the lessening amount of constriction. He will not use compression pumps while he is away 08/05/18 on evaluation today patient actually appears to be doing decently well all things considered in regard to his bilateral lower extremities. The  worst ulcer is actually only posterior aspect of his left lower extremity with a four layer compression wrap cut into his leg a couple weeks back. He did have a trip and actually had [employer] for the trip that he is worn since he was last here. Nonetheless he feels like the [employer] actually do better for him his swelling is up a little bit but he also with his trip was not taking his Lasix on a regular set schedule like he was supposed to be. He states that obviously the reason being that he cannot drive and keep going without having to urinate too frequently which makes it difficult. He did not have his pumps with him while he was away either which I think also maybe playing a role here too. 08/13/2018; the patient only has a small open wound on the right lateral calf which is a big improvement in the last month or 2. He also has the area posteriorly just below the posterior fossa on the left which I think was a wrap injury from several weeks ago. He has no current evidence of cellulitis. He tells me he is back into his compression pumps twice a day. He also tells me  that while he was at the laundromat somebody stole a section of his extremitease stockings 08/20/2018; back in the clinic with a much improved state. He only has small areas on the right lateral mid calf which is just about healed. This was is more substantial area for quite a prolonged period of time. He has a small open area on the left anterior tibia. The area on the posterior calf just below the popliteal fossa is closed today. He is using his compression pumps twice a day 08/28/2018; patient has no open wound on the right leg. He has a smattering of open areas on the calf with some weeping lymphedema. More problematically than that it looks as though his wraps of slipped down in his usual he has very angry upper area of edema just below the right medial knee and on the right lateral calf. He has no open area on his  feet. The patient is traveling to [facility] next week. I will send him in an antibiotic. We will continue to wrap the right leg. We ordered extremitease stockings for him last week and I plan to transition the right leg to a stocking when he gets home which will be in 10 days time. As usual he is very reluctant to take his pumps with him when he travels 09/07/2018; patient returns from [facility]. He shows me a picture of his left leg in the mid part of his trip last week with intense fire engine erythema. The picture look bad enough I would have considered sending him to the hospital. Instead he went to the wound care center in [facility]. They did not prescribe him antibiotics but he did take some doxycycline he had leftover from a previous visit. I had given him trimethoprim sulfamethoxazole before he left this did not work according to the patient. This is resulted in some improvement fortunately. He comes back with a large wound on the left posterior calf. Smaller area on the left anterior tibia. Denuded blisters on the dorsal left foot over his toes. Does not have much in the way of wounds on the right leg although he does have a very tender area on the right posterior area just below the popliteal fossa also suggestive of infection. He promises me he is back on his pumps twice a day 09/15/2018; the intense cellulitis in his left lower calf is a lot better. The wound area on the posterior left calf is also so better. However he has reasonably extensive wounds on the dorsal aspect of his second and third toes and the proximal foot just at the base of the toes. There is nothing open on the right leg 09/22/2018; the patient has excellent edema control in his legs bilaterally. He is using his external compression pumps twice a day. He has no open area on the right leg and only the areas in the left foot dorsally second and third toe area on the left side. He does not have any signs of  active cellulitis. 10/06/2018; the patient has good edema control bilaterally. He has no open wound on the right leg. There is a blister in the posterior aspect of his left calf that we had to deal with today. He is using his compression pumps twice a day. There is no signs of active cellulitis. We have been using silver alginate to the wound areas. He still has vulnerable areas on the base of his left first second toes dorsally He has a his extremities  stockings and we are going to transition him today into the stocking on the right leg. He is cautioned that he will need to continue to use the compression pumps twice a day. If he notices uncontrolled edema in the right leg he may need to go to 3 times a day. 10/13/2018; the patient came in for a nurse check on Friday he has a large flaccid blister on the right medial calf just below the knee. We unroofed this. He has this and a new area underneath the posterior mid calf which was undoubtedly a blister as well. He also has several small areas on the right which is the area we put his extremities stocking on. 10/19/2018; the patient went to see infectious disease this morning I am not sure if that was a routine follow-up in any case the doxycycline I had given him was discontinued and started on linezolid. He has not started this. It is easy to look at his left calf and the inflammation and think this is cellulitis however he is very tender in the tissue just below the popliteal fossa and I have no doubt that there is infection going on here. He states the problem he is having is that with the compression pumps the edema goes down and then starts walking the wrap falls down. We will see if we can adhere this. He has 1 or 2 minuscule open areas on the right still areas that are weeping on the posterior left calf, the base of his left second and third toes 10/26/18; back today in clinic with quite of skin breakdown in his left anterior leg. This may have  been infection the area below the popliteal fossa seems a lot better however tremendous epithelial loss on the left anterior mid tibia area over quite inexpensive tissue. He has 2 blisters on the right side but no other open wound here. 10/29/2018; came in urgently to see [place] today and we worked him in for review. He states that the 4 layer compression on the right leg caused pain he had to cut it down to roughly his mid calf this caused swelling above the wrap and he has blisters and skin breakdown today. As a result of the pain he has not been using his pumps. Both legs are a lot more edematous and there is a lot of weeping fluid. 11/02/18; arrives in clinic with continued difficulties in the right leg> left. Leg is swollen and painful. multiple skin blisters and new open areas especially laterally. He has not been using his pumps on the right leg. 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 He is not systemically unwell. 11/09/2018; the patient claims he is being compliant with his pumps. He is finished the doxycycline I gave him last week. Culture I did of the wound on the right lateral leg showed a few very resistant methicillin staph aureus. This was resistant to doxycycline. Nevertheless he states the pain in the leg is a lot better which makes me wonder if the cultured organism was not really what was causing the problem nevertheless this is a very dangerous organism to be culturing out of any wound. His right leg is still a lot larger than the left. He is using an Radio broadcast assistant on this area, he blames a 4-layer compression for causing the original skin breakdown which I doubt is true however I cannot talk him out of it. We have been using silver alginate to all  of these areas which were initially blisters 11/16/2018; patient is being compliant with his external compression pumps at twice a day. Miraculously he arrives in clinic today with absolutely no  open wounds. He has better edema control on the left where he has been using 4 layer compression versus wound of wounds on the right and I pointed this out to him. There is no inflammation in the skin in his lower legs which is also somewhat unusual for him. There is no open wounds on the dorsal left foot. He has extremitease stockings at home and I have asked him to bring these in next week. 11/25/18 patient's lower extremity on examination today on the left appears for the most part to be wound free. He does have an open wound on the lateral aspect of the right lower extremity but this is minimal compared to what I've seen in past. He does request that we go ahead and wrap the left leg as well even though there's nothing open just so hopefully it will not reopen in short order. 1/28; patient has superficial open wounds on the right lateral calf left anterior calf and left posterior calf. His edema control is adequate. 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 We have been using silver alginate as the primary dressing. He claims compliance with his compression pumps 2/4; patient has superficial open wounds on numerous areas of his left calf and again one on the left dorsal foot. The areas on the right lateral calf have healed. The cellulitis that I gave him doxycycline for last week is also resolved this was mostly on the left anterior calf just below the tibial tuberosity. His edema looks fairly well-controlled. He tells me he went to see his primary doctor today and had blood work ordered 2/11; once again he has several open areas on the left calf left tibial area. Most of these are small and appear to have healthy granulation. He does not have anything open on the right. The edema and control in his thighs is pretty good which is usually a good indication he has been using his pumps as requested. 2/18; he  continues to have several small areas on the left calf and left tibial area. Most of these are small healthy granulation. We put him in his stocking on the right leg last week and he arrives with a superficial open area over the right upper tibia and a fairly large area on the right lateral tibia in similar condition. His edema control actually does not look too bad, he claims to be using his compression pumps twice a day 2/25. Continued small areas on the left calf and left tibial area. New areas especially on the right are identified just below the tibial tuberosity and on the right upper tibia itself. There are also areas of weeping edema fluid even without an obvious wound. He does not have a considerable degree of lymphedema but clearly there is more edema here than his skin can handle. He states he is using the pumps twice a day. We have an Unna boot on the right and 4 layer compression on the left. 3/3; he continues to have an area on the right lateral calf and right posterior calf just below the popliteal fossa. There is a fair amount of tenderness around the wound on the popliteal fossa but I did not see any evidence of cellulitis, could just be  that the wrap came down and rubbed in this area. ooHe does not have an open area on the left leg however there is an area on the left dorsal foot at the base of the third toe ooWe have been using silver alginate to all wound areas 3/10; he did not have an open area on his left leg last time he was here a week ago. T oday he arrives with a horizontal wound just below the tibial tuberosity and an area on the left lateral calf. He has intense erythema and tenderness in this area. The area is on the right lateral calf and right posterior calf better than last week. We have been using silver alginate as usual 3/18 - Patient returns with 3 small open areas on left calf, and 1 small open area on right calf, the skin looks ok with no significant erythema, he  continues the UNA boot on right and 4 layer compression on left. The right lateral calf wound is closed , the right posterior is small area. we will continue silver alginate to the areas. 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 He is been tolerating the dressing changes without complication. Fortunately she has made excellent improvement in regard to the overall status of his wounds. Unfortunately every time we cease wrapping him he ends up reopening in causing more significant issues at that point. Again I'm unsure of the best direction to take although I think the lymphedema clinic may be appropriate for him. 02/03/19 on evaluation today patient appears to be doing well in regard to the wounds that we saw him for last week unfortunately he has a new area on the proximal portion of his right medial/posterior lower extremity where the wrap somewhat slowed down and caused swelling and a blister to rub and open. Unfortunately this is the only opening that he has on either leg at this point. 02/17/19 on evaluation today patient's bilateral lower extremities appear to be doing well. He still completely healed in regard to the left lower extremity. In regard to the right lower extremity the area where the wrap and slid down and caused the blister still seems to be slightly open although this is dramatically better than during the last evaluation two weeks ago. I'm very pleased with the way this stands overall. 03/03/19 on evaluation today patient appears to be doing well in regard to his right lower extremity in general although he did have a new blister open this does not appear to be showing any evidence of active infection at this time. Fortunately there's No fevers, chills, nausea, or vomiting noted at this  time. Overall I feel like he is making good progress it does feel like that the right leg will we perform the [employer] seems to do with a bit better than three layer wrap on the left which slid down on him. We may switch to doing bilateral in the book wraps. 5/4; I have not seen Mr. Ohman in quite some time. According to our case manager he did not have an open wound on his left leg last week. He had 1 remaining wound on the right posterior medial calf. He arrives today with multiple openings on the left leg probably were blisters and/or wrap injuries from Unna boots. I do not think the [employer]  boot's will provide adequate compression on the left. I am also not clear about the frequency he is using the compression pumps. 03/17/19 on evaluation today patient appears to be doing excellent in regard to his lower extremities compared to last week's evaluation apparently. He had gotten significantly worse last week which is unfortunate. The [employer] wrap on the left did not seem to do very well for him at all and in fact it didn't control his swelling significantly enough he had an additional outbreak. Subsequently we go back to the four layer compression wrap on the left. This is good news. At least in that he is doing better and the wound seem to be killing him. He still has not heard anything from the lymphedema clinic. 03/24/19 on evaluation today patient actually appears to be doing much better in regard to his bilateral lower Trinity as compared to last week when I saw him. Fortunately there's no signs of active infection at this time. He has been tolerating the dressing changes without complication. Overall I'm extremely pleased with the progress and appearance in general. 04/07/19 on evaluation today patient appears to be doing well in regard to his bilateral lower extremities. His swelling is significantly down from where it was previous. With that being said he does have a couple blisters still open  at this point but fortunately nothing that seems to be too severe and again the majority of the larger openings has healed at this time. 04/14/19 on evaluation today patient actually appears to be doing quite well in regard to his bilateral lower extremities in fact I'm not even sure there's anything significantly open at this time at any site. Nonetheless he did have some trouble with these wraps where they are somewhat irritating him secondary to the fact that he has noted that the graph wasn't too close down to the end of this foot in a little bit short as well up to his knee. Otherwise things seem to be doing quite well. 04/21/19 upon evaluation today patient's wound bed actually showed evidence of being completely healed in regard to both lower extremities which is excellent news. There does not appear to be any signs of active infection which is also good news. I'm very pleased in this regard. No fevers, chills, nausea, or vomiting noted at this time. 04/28/19 on evaluation today patient appears to be doing a little bit worse in regard to both lower extremities on the left mainly due to the fact that when he went infection disease the wrap was not wrapped quite high enough he developed a blister above this. On the right he is a small open area of nothing too significant but again this is continuing to give him some trouble he has been were in the Velcro compression that he has at home. 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 He's been tolerating the dressing changes without complication. Fortunately there's no signs of active infection at this time. No fevers, chills, nausea, or vomiting noted at this time. We have been trying to get an appointment with her lymphedema clinic in [facility] but unfortunately nobody can get them on phone with not been able to even fax information over the patient likewise is not been  able to get in touch with them. Overall I'm not sure exactly what's going on here with to reach out again today. 05/12/19 on evaluation today patient actually appears to be doing about  the same in regard to his bilateral lower Trinity ulcers. Still having a lot of drainage unfortunately. He tells me especially in the left but even on the right. There's no signs of active infection which is good news we've been using so ratcheted up to this point. 05/19/19 on evaluation today patient actually appears to be doing quite well with regard to his left lower extremity which is great news. Fortunately in regard to the right lower extremity has an issues with his wrap and he subsequently did remove this from what I'm understanding. Nonetheless long story short is what he had rewrapped once he removed it subsequently had maggots underneath this wrap whenever he came in for evaluation today. With that being said they were obviously completely cleaned away by the nursing staff. The visit today which is excellent news. However he does appear to potentially have some infection around the right ankle region where the maggots were located as well. He will likely require anabiotic therapy today. 05/26/19 on evaluation today patient actually appears to be doing much better in regard to his bilateral lower extremities. I feel like the infection is under much better control. With that being said there were maggots noted when the wrap was removed yet again today. Again this could have potentially been left over from previous although at this time there does not appear to be any signs of significant drainage there was obviously on the wrap some drainage as well this contracted gnats or otherwise. Either way I do not see anything that appears to be doing worse in my pinion and in fact I think his drainage has slowed down quite significantly likely mainly due to the fact to his infection being under better control. 06/02/2019 on  evaluation today patient actually appears to be doing well with regard to his bilateral lower extremities there is no signs of active infection at this time which is great news. With that being said he does have several open areas more so on the right than the left but nonetheless these are all significantly better than previously noted. 06/09/2019 on evaluation today patient actually appears to be doing well. His wrap stayed up and he did not cause any problems he had more drainage on the right compared to the left but overall I do not see any major issues at this time which is great news. 06/16/2019 on evaluation today patient appears to be doing excellent with regard to his lower extremities the only area that is open is a new blister that can have opened as of today on the medial ankle on the left. Other than this he really seems to be doing great I see no major issues at this point. 06/23/2019 on evaluation today patient appears to be doing quite well with regard to his bilateral lower extremities. In fact he actually appears to be almost completely healed there is a small area of weeping noted of the right lower extremity just above the ankle. Nonetheless fortunately there is no signs of active infection at this time which is good news. No fevers, chills, nausea, vomiting, or diarrhea. 8/24; the patient arrived for a nurse visit today but complained of very significant pain in the left leg and therefore I was asked to look at this. Noted that he did not have an open area on the left leg last week nevertheless this was wrapped. The patient states that he is not been able to put his compression pumps on the left leg because of the  discomfort. He has not been systemically unwell 06/30/2019 on evaluation today patient unfortunately despite being excellent last week is doing much worse with regard to his left lower extremity today. In fact he had to come in for a nurse on Monday where his left leg had to  be rewrapped due to excessive weeping Dr. Leanord Hawking placed him on doxycycline at that point. Fortunately there is no signs of active infection Systemically at this time which is good news. 07/07/2019 in regard to the patient's wounds today he actually seems to be doing well with his right lower extremity there really is nothing open or draining at this point this is great news. Unfortunately the left lower extremity is given him additional trouble at this time. There does not appear to be any signs of active infection nonetheless he does have a lot of edema and swelling noted at this point as well as blistering all of which has led to a much more poor appearing leg at this time compared to where it was 2 weeks ago when it was almost completely healed. Obviously this is a little discouraging for the patient. He is try to contact the lymphedema clinic in [place] he has not been able to get through to them. 07/14/2019 on evaluation today patient actually appears to be doing slightly better with regard to his left lower extremity ulcers. Overall I do feel like at least at the top of the wrap that we have been placing this area has healed quite nicely and looks much better. The remainder of the leg is showing signs of improvement. Unfortunately in the thigh area he still has an open region on the left and again on the right he has been utilizing just a Band-Aid on an area that also opened on the thigh. Again this is an area that were not able to wrap although we did do an Ace wrap to provide some compression that something that obviously is a little less effective than the compression wraps we have been using on the lower portion of the leg. He does have an appointment with the lymphedema clinic in [facility] on Friday. 07/21/2019 on evaluation today patient appears to be doing better with regard to his lower extremity ulcers. He has been tolerating the dressing changes without complication. Fortunately  there is no signs of active infection at this time. No fevers, chills, nausea, vomiting, or diarrhea. I did receive the paperwork from the physical therapist at the lymphedema clinic in [place]. Subsequently I signed off on that this morning and sent that back to him for further progression with the treatment plan. 07/28/2019 on evaluation today patient appears to be doing very well with regard to his right lower extremity where I do not see any open wounds at this point. Fortunately he is feeling great as far as that is concerned as well. In regard to the left lower extremity he has been having issues with still several areas of weeping and edema although the upper leg is doing better his lower leg still I think is going require the compression wrap at this time. No fevers, chills, nausea, vomiting, or diarrhea. 08/04/2019 on evaluation today patient unfortunately is having new wounds on the right lower extremity. Again we have been using Unna boot wrap on that side. We switched him to using his juxta lite wrap at home. With that being said he tells me he has been using it although his legs extremely swollen and to be honest really does not appear  that he has been. I cannot know that for sure however. Nonetheless he has multiple new wounds on the right lower extremity at this time. Obviously we will have to see about getting this rewrapped for him today. 08/11/2019 on evaluation today patient appears to be doing fairly well with regard to his wounds. He has been tolerating the dressing changes including the compression wraps without complication. He still has a lot of edema in his upper thigh regions bilaterally he is supposed to be seeing the lymphedema clinic on the 15th of this month once his wraps arrive for the upper part of his legs. 08/18/2019 on evaluation today patient appears to be doing well with regard to his bilateral lower extremities at this point. He has been tolerating the  dressing changes without complication. Fortunately there is no signs of active infection which is also good news. He does have a couple weeping areas on the first and second toe of the right foot he also has just a small area on the left foot upper leg and a small area on the left lower leg but overall he is doing quite well in my opinion. He is supposed to be getting his wraps shortly in fact tomorrow and then subsequently is seeing the lymphedema clinic next Wednesday on the 21st. Of note he is also leaving on the 25th to go on vacation for a week to the beach. For that reason and since there is some uncertainty about what there can be doing at lymphedema clinic next Wednesday I am get a make an appointment for next Friday here for [place] to see what we need to do for him prior to him leaving for vacation. 10/23; patient arrives in considerable pain predominantly in the upper posterior calf just distal to the popliteal fossa also in the wound anteriorly above the major wound. This is probably cellulitis and he has had this recurrently in the past. He has no open wound on the right side and he has had an Radio broadcast assistant in that area. Finally I note that he has an area on the left posterior calf which by enlarge is mostly epithelialized. This protrudes beyond the borders of the surrounding skin in the setting of dry scaly skin and lymphedema. The patient is leaving for [facility] on Sunday. Per his longstanding pattern, he will not take his compression pumps with him predominantly out of fear that they will be stolen. He therefore asked that we put a Unna boot back on the right leg. He will also contact the wound care center in [facility] to see if they can change his dressing in the mid week. 11/3; patient returned from his vacation to [facility]. He was seen on 1 occasion at their wound care center. They did a 2 layer compression system as they did not have our 4-layer wrap. I am not completely certain  what they put on the wounds. They did not change the Unna boot on the right. The patient is also seeing a lymphedema specialist physical therapist in [place]. It appears that he has some compression sleeve for his thighs which indeed look quite a bit better than I am used to seeing. He pumps over these with his external compression pumps. 11/10; the patient has a new wound on the right medial thigh otherwise there is no open areas on the right. He has an area on the left leg posteriorly anteriorly and medially and an area over the left second toe. We have been  using silver alginate. He thinks the injury on his thigh is secondary to friction from the compression sleeve he has. 11/17; the patient has a new wound on the right medial thigh last week. He thinks this is because he did not have a underlying stocking for his thigh juxta lite apparatus. He now has this. The area is fairly large and somewhat angry but I do not think he has underlying cellulitis. ooHe has a intact blister on the right anterior tibial area. ooSmall wound on the right great toe dorsally ooSmall area on the medial left calf. 11/30; the patient does not have any open areas on his right leg and we did not take his juxta lite stocking off. However he states that on Friday his compression wrap fell down lodging around his upper mid calf area. As usual this creates a lot of problems for him. He called urgently today to be seen for a nurse visit however the nurse visit turned into a provider visit because of extreme erythema and pain in the left anterior tibia extending laterally and posteriorly. The area that is problematic is extensive 10/06/2019 upon evaluation today patient actually appears to be doing poorly in regard to his left lower extremity. He Dr. Leanord Hawking did place him on doxycycline this past Monday apparently due to the fact that he was doing much worse in regard to this left leg. Fortunately the doxycycline does  seem to be helping. Unfortunately we are still having a very difficult time getting his edema under any type of control in order to anticipate discharge at some point. The only way were really able to control his lymphedema really is with compression wraps and that has only even seemingly temporary. He has been seeing a lymphedema clinic they are trying to help in this regard but still this has been somewhat frustrating in general for the patient. 10/13/19 on evaluation today patient appears to be doing excellent with regard to his right lower extremity as far as the wounds are concerned. His swelling is still quite extensive unfortunately. He is still having a lot of drainage from the thigh areas bilaterally which is unfortunate. He's been going to lymphedema clinic but again he still really does not have this edema under control as far as his lower extremities are concern. With regard to his left lower extremity this seems to be improving and I do believe the doxycycline has been of benefit for him. He is about to complete the doxycycline. 10/20/2019 on evaluation today patient appears to be doing poorly in regard to his bilateral lower extremities. More in the right thigh he has a lot of irritation at this site unfortunately. In regard to the left lower extremity the wrap was not quite as high it appears and does seem to have caused him some trouble as well. Fortunately there is no evidence of systemic infection though he does have some blue-green drainage which has me concerned for the possibility of Pseudomonas. He tells me he is previously taking Cipro without complications and he really does not care for Levaquin however due to some of the side effects he has. He is not allergic to any medications specifically antibiotics that were aware of. 10/27/2019 on evaluation today patient actually does appear to be for the most part doing better when compared to last week's evaluation. With that being  said he still has multiple open wounds over the bilateral lower extremities. He actually forgot to start taking the Cipro and states that he still  has the whole bottle. He does have several new blisters on left lower extremity today I think I would recommend he go ahead and take the Cipro based on what I am seeing at this point. 12/30-Patient comes at 1 week visit, 4 layer compression wraps on the left and Unna boot on the right, primary dressing Xtrasorb and silver alginate. Patient is taking his Cipro and has a few more days left probably 5-6, and the legs are doing better. He states he is using his compressions devices which I believe he has 11/10/2019 on evaluation today patient actually appears to be much better than last time I saw him 2 weeks ago. His wounds are significantly improved and overall I am very pleased in this regard. Fortunately there is no signs of active infection at this time. He is just a couple of days away from completing Cipro. Overall his edema is much better he has been using his lymphedema pumps which I think is also helping at this point. 11/17/2019 on evaluation today patient appears to be doing excellent in regard to his wounds in general. His legs are swollen but not nearly as much as they have been in the past. Fortunately he is tolerating the compression wraps without complication. No fevers, chills, nausea, vomiting, or diarrhea. He does have some erythema however in the distal portion of his right lower extremity specifically around the forefoot and toes there is a little bit of warmth here as well. 11/24/2019 on evaluation today patient appears to be doing well with regard to his right lower extremity I really do not see any open wounds at this point. His left lower extremity does have several open areas and his right medial thigh also is open. Other than this however overall the patient seems to be making good progress and I am very pleased at this point. 12/01/2019  on evaluation today patient appears to be doing poorly at this point in regard to his left lower extremity has several new blisters despite the fact that we have him in compression wraps. In fact he had a 4-layer compression wrap, his upper thigh wrapped from lymphedema clinic, and a juxta light over top of the 4 layer compression wrap the lymphedema clinic applied and despite all this he still develop blisters underneath. Obviously this does have me concerned about the fact that unfortunately despite what we are doing to try to get wounds healed he continues to have new areas arise I do not think he is ever good to be at the point where he can realistically just use wraps at home to keep things under control. Typically when we heal him it takes about 1-2 days before he is back in the clinic with severe breakdown and blistering of his lower extremities bilaterally. This is happened numerous times in the past. Unfortunately I think that we may need some help as far as overall fluid overload to kind of limit what we are seeing and get things under better control. 12/08/2019 on evaluation today patient presents for follow-up concerning his ongoing bilateral lower extremity edema. Unfortunately he is still having quite a bit of swelling the compression wraps are controlling this to some degree but he did see Dr. [name] his cardiologist I do have that available for review today as far as the appointment was concerned that was on 12/06/2019. Obviously that she has been 2 days ago. The patient states that he is only been taking the Lasix 80 mg 1 time a day he  had told me previously he was taking this twice a day. Nonetheless Dr. [name] recommended this be up to 80 mg 2 times a day for the patient as he did appear to be fluid overloaded. With that being said the patient states he did this yesterday and he was unable to go anywhere or do anything due to the fact that he was constantly having to urinate. Nonetheless I  think that this is still good to be something that is important for him as far as trying to get his edema under control at all things that he is going to be able to just expect his wounds to get under control and things to be better without going through at least a period of time where he is trying to stabilize his fluid management in general and I think increasing the Lasix is likely the first step here. It was also mentioned the possibility that the patient may require metolazone. With that being said he wanted to have the patient take Lasix twice a day first and then reevaluating 2 months to see where things stand. 12/15/2019 upon evaluation today patient appears to be doing regard to his legs although his toes are showing some signs of weeping especially on the left at this point to some degree on the right. There does not appear to be any signs of active infection and overall I do feel like the compression wraps are doing well for him but he has not been able to take the Lasix at home and the increased dose that Dr. [name] recommended. He tells me that just not go to be feasible for him. Nonetheless I think in this case he should probably send a message to Dr. [name] in order to discuss options from the standpoint of possible admission to get the fluid off or otherwise going forward. 12/22/2019 upon evaluation today patient appears to be doing fairly well with regard to his lower extremities at this point. In fact he would be doing excellent if it was not for the fact that his right anterior thigh apparently had an allergic reaction to adhesive tape that he used. The wound itself that we have been monitoring actually appears to be healed. There is a lot of irritation at this point. 12/29/2019 upon evaluation today patient appears to be doing well in regard to his lower extremities. His left medial thigh is open and somewhat draining today but this is the only region that is open the right has done much  better with the treatment utilizing the steroid cream that I prescribed for him last week. Overall I am pleased in that regard. Fortunately there is no signs of active infection at this time. No fevers, chills, nausea, vomiting, or diarrhea. 01/05/2020 upon evaluation today patient appears to be doing more poorly in regard to his right lower extremity at this point upon evaluation today. Unfortunately he continues to have issues in this regard and I think the biggest issue is controlling his edema. This obviously is not very well controlled at this point is been recommended that he use the Lasix twice a day but he has not been able to do that. Unfortunately I think this is leading to an issue where honestly he is not really able to effectively control his edema and therefore the wounds really are not doing significantly better. I do not think that he is going to be able to keep things under good control unless he is able to control his edema much better.  I discussed this again in great detail with him today. 01/12/2020 good news is patient actually appears to be doing quite well today at this point. He does have an appointment with lymphedema clinic tomorrow. His legs appear healed and the toe on the left is almost completely healed. In general I am very pleased with how things stand at this point. 01/19/2020 upon evaluation today patient appears to actually be doing well in regard to his lower extremities there is nothing open at this point. Fortunately he has done extremely well more recently. Has been seeing lymphedema clinic as well. With that being said he has Velcro wraps for his lower legs as well as his upper legs. The only wound really is on his toe which is the right great toe and this is barely anything even there. With all that being said I think it is good to be appropriate today to go ahead and switch him over to the Velcro compression wraps. 01/26/2020 upon evaluation today patient appears to  be doing worse with regard to his lower extremities after last week switch him to Velcro compression wraps. Unfortunately he lasted less than 24 hours he did not have the sock portion of his Velcro wrap on the left leg and subsequently developed a blister underneath the Velcro portion. Obviously this is not good and not what we were looking for at this point. He states the lymphedema clinic did tell him to wear the wrap for 23 hours and take him off for 1 I am okay with that plan but again right now we got a get things back under control again he may have some cellulitis noted as well. 02/02/2020 upon evaluation today patient unfortunately appears to have several areas of blistering on his bilateral lower extremities today mainly on the feet. His legs do seem to be doing somewhat better which is good news. Fortunately there is no evidence of active infection at this time. No fevers, chills, nausea, vomiting, or diarrhea. 02/16/2020 upon evaluation today patient appears to be doing well at this time with regard to his legs. He has a couple weeping areas on his toes but for the most part everything is doing better and does appear to be sealed up on his legs which is excellent news. We can continue with wrapping him at this point as he had every time we discontinue the wraps he just breaks out with new wounds. There is really no point in is going forward with this at this point. 03/08/2020 upon evaluation today patient actually appears to be doing quite well with regard to his lower extremity ulcers. He has just a very superficial and really almost nonexistent blister on the left lower extremity he has in general done very well with the compression wraps. With that being said I do not see any signs of infection at this time which is good news. 03/29/2020 upon evaluation today patient appears to be doing well with regard to his wounds currently except for where he had several new areas that opened up due to  some of the wrap slipping and causing him trouble. He states he did not realize they had slipped. Nonetheless he has a 1 area on the right and 3 new areas on the left. Fortunately there is no signs of active infection at this time which is great news. 04/05/2020 upon evaluation today patient actually appears to be doing quite well in general in regard to his legs currently. Fortunately there is no signs of active  infection at this time. No fevers, chills, nausea, vomiting, or diarrhea. He tells me next week that he will actually be seen in the lymphedema clinic on Thursday at 10 AM I see him on Wednesday next week. 04/12/2020 upon evaluation today patient appears to be doing very well with regard to his lower extremities bilaterally. In fact he does not appear to have any open wounds at this point which is good news. Fortunately there is no signs of active infection at this time. No fevers, chills, nausea, vomiting, or diarrhea. 04/19/2020 upon evaluation today patient appears to be doing well with regard to his wounds currently on the bilateral lower extremities. There does not appear to be any signs of active infection at this time. Fortunately there is no evidence of systemic infection and overall very pleased at this point. Nonetheless after I held him out last week he literally had blisters the next morning already which swelled up with him being right back here in the clinic. Overall I think that he is just not can be able to be discharged with his legs the way they are he is much to volume overloaded as far as fluid is concerned and that was discussed with him today of also discussed this but should try the clinic nurse manager as well as Dr. Leanord Hawking. 04/26/2020 upon evaluation today patient appears to be doing better with regard to his wounds currently. He is making some progress and overall swelling is under good control with the compression wraps. Fortunately there is no evidence of active infection  at this time. 05/10/2020 on evaluation today patient appears to be doing overall well in regard to his lower extremities bilaterally. He is Tolerating the compression wraps without complication and with what we are seeing currently I feel like that he is making excellent progress. There is no signs of active infection at this time. 05/24/2020 upon evaluation today patient appears to be doing well in regard to his legs. The swelling is actually quite a bit down compared to where it has been in the past. Fortunately there is no sign of active infection at this time which is also good news. With that being said he does have several wounds on his toes that have opened up at this point. 05/31/2020 upon evaluation today patient appears to be doing well with regard to his legs bilaterally where he really has no significant fluid buildup at this point overall he seems to be doing quite well. Very pleased in this regard. With regard to his toes these also seem to be drying up which is excellent. We have continue to wrap him as every time we tried as a transition to the juxta light wraps things just do not seem to get any better. 06/07/2020 upon evaluation today patient appears to be doing well with regard to his right leg at this point. Unfortunately left leg has a lot of blistering he tells me the wrap started to slide down on him when he tried to put his other Velcro wrap over top of it to help keep things in order but nonetheless still had some issues. 06/14/2020 on evaluation today patient appears to be doing well with regard to his lower extremity ulcers and foot ulcers at this point. I feel like everything is actually showing signs of improvement which is great news overall there is no signs of active infection at this time. No fevers, chills, nausea, vomiting, or diarrhea. 06/21/2020 on evaluation today patient actually appears to be doing  okay in regard to his wounds in general. With that being said the biggest  issue I see is on his right foot in particular the first and second toe seem to be doing a little worse due to the fact this is staying very wet. I think he is probably getting need to change out his dressings a couple times in between each week when we see him in regard to his toes in order to keep this drier based on the location and how this is proceeding. Objective Constitutional Obese and well-hydrated in no acute distress. Vitals Time Taken: 10:17 AM, Height: 70 in, Weight: 380.2 lbs, BMI: 54.5, Temperature: 97.8 F, Pulse: 75 bpm, Respiratory Rate: 20 breaths/min, Blood Pressure: 161/73 mmHg, Capillary Blood Glucose: 171 mg/dl. Respiratory normal breathing without difficulty. Psychiatric this patient is able to make decisions and demonstrates good insight into disease process. Alert and Oriented x 3. pleasant and cooperative. General Notes: Patient's wounds again in general seem to be doing quite well which is great news. With that being said I did discuss with the patient currently that with regard to his toes I think he is getting need to change his dressings more frequently on at least probably 2 occasions in between when we see him in order to keep the moisture down and prevent this from worsening. Integumentary (Hair, Skin) Wound #177 status is Open. Original cause of wound was Blister. The wound is located on the Right T Great. The wound measures 2.1cm length x 1.8cm oe width x 0.1cm depth; 2.969cm^2 area and 0.297cm^3 volume. There is Fat Layer (Subcutaneous Tissue) Exposed exposed. There is no tunneling or undermining noted. There is a medium amount of serous drainage noted. The wound margin is flat and intact. There is large (67-100%) pink granulation within the wound bed. There is no necrotic tissue within the wound bed. Wound #178 status is Open. Original cause of wound was Gradually Appeared. The wound is located on the Right T Second. The wound measures 0.8cm oe length x  1.5cm width x 0.1cm depth; 0.942cm^2 area and 0.094cm^3 volume. There is Fat Layer (Subcutaneous Tissue) Exposed exposed. There is no tunneling or undermining noted. There is a medium amount of serous drainage noted. The wound margin is flat and intact. There is large (67-100%) pink granulation within the wound bed. There is no necrotic tissue within the wound bed. Wound #180 status is Open. Original cause of wound was Blister. The wound is located on the Left,Proximal,Dorsal Foot. The wound measures 0.5cm length x 0.6cm width x 0.1cm depth; 0.236cm^2 area and 0.024cm^3 volume. There is Fat Layer (Subcutaneous Tissue) Exposed exposed. There is no tunneling or undermining noted. There is a medium amount of serous drainage noted. The wound margin is flat and intact. There is large (67-100%) pink granulation within the wound bed. There is no necrotic tissue within the wound bed. Wound #181 status is Open. Original cause of wound was Blister. The wound is located on the Left T Second. The wound measures 0cm length x 0cm width x oe 0cm depth; 0cm^2 area and 0cm^3 volume. There is no tunneling or undermining noted. There is a small amount of serous drainage noted. The wound margin is flat and intact. There is no granulation within the wound bed. There is no necrotic tissue within the wound bed. Wound #182 status is Healed - Epithelialized. Original cause of wound was Gradually Appeared. The wound is located on the Left,Lateral Lower Leg. The wound measures 0cm length x 0cm width  x 0cm depth; 0cm^2 area and 0cm^3 volume. There is no tunneling or undermining noted. There is a none present amount of drainage noted. The wound margin is flat and intact. There is no granulation within the wound bed. There is no necrotic tissue within the wound bed. 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 Post procedure Diagnosis Wound #: Same as Pre-Procedure There was a Four Layer Compression Therapy Procedure by Yevonne Pax, RN. Post procedure Diagnosis Wound #: Same as Pre-Procedure Plan Follow-up Appointments: Return Appointment in 2 weeks. - with Leonard Schwartz Nurse Visit: - 1 week Dressing Change Frequency: Do not change entire dressing for one week. - both legs Wound #177 Right T Great: oe Change Dressing every other day. Wound #178 Right T Second: oe Change Dressing every other day. Skin Barriers/Peri-Wound Care: Moisturizing lotion - both legs and feet and toes Wound Cleansing: May shower with protection. Primary Wound Dressing: Wound #177 Right T Great: oe Calcium Alginate with Silver Wound #178 Right T Second: oe Calcium Alginate with Silver Wound #180 Left,Proximal,Dorsal Foot: Calcium Alginate with Silver Secondary Dressing: Wound #177 Right T Great: oe Kerlix/Rolled Gauze Dry Gauze Wound #178 Right T Second: oe Kerlix/Rolled Gauze Dry Gauze Wound #180 Left,Proximal,Dorsal Foot: Dry Gauze Edema Control: 4 layer compression: Left lower extremity Unna Boot to Right Lower Extremity Avoid standing for long periods of time Elevate legs to the level of the heart or above for 30 minutes daily and/or when sitting, a frequency of: - throughout the day Exercise regularly 1. I am going to suggest at this point that we go ahead and continue with the wound care measures as before specifically with regard to the compression wraps. Were using the Unna boot wrap on the right leg and on the left leg were using a 3  layer compression wrap. 2. I am also can recommend that we continue with the silver alginate to the open wound locations. 3. With regard to his toes the patient is going to change the silver alginate about couple times in between when he comes back here in the clinic for evaluation. We will see patient back for reevaluation in 1 week here in the clinic. If anything worsens or changes patient will contact our office for additional recommendations. Electronic Signature(s) Signed: 06/21/2020 11:13:53 AM By: Lenda Kelp PA-C Previous Signature: 06/21/2020 11:11:49 AM Version By: Lenda Kelp PA-C Entered By: Lenda Kelp on 06/21/2020 11:13:52 -------------------------------------------------------------------------------- SuperBill Details Patient Name: Date of Service: [name], Jerrin J. 06/21/2020 Medical Record Number: [id number] Patient Account Number: [id number] Date of Birth/Sex: Treating RN: [birth year] (69 y.o. [name] Primary Care Provider: Marney Setting, [name] Other Clinician: Referring Provider: Treating Provider/Extender: Lenda Kelp [facility] [name], [name] in Treatment: 230 Diagnosis Coding ICD-10 Codes Code Description [phone] Non-pressure chronic ulcer of right calf limited to breakdown of skin L97.221 Non-pressure chronic ulcer of left calf limited to breakdown of skin 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 of bilateral lower extremity I89.0 Lymphedema, not elsewhere classified E11.622 Type 2 diabetes mellitus with other skin ulcer E11.40 Type 2  diabetes mellitus with diabetic neuropathy, unspecified L03.116 Cellulitis of left lower limb Facility Procedures CPT4 Code: 40981191 Description: (Facility Use Only) [id number] - APPLY Roland Rack BOOT RT Modifier: Quantity: 1 CPT4 Code: 21308657 Description: (Facility Use Only) [id number] - APPLY MULTLAY COMPRS LWR LT LEG Modifier: 59 Quantity: 1 Physician Procedures : CPT4 Code  Description Modifier [id number] - WC PHYS LEVEL 3 - EST PT ICD-10 Diagnosis Description 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 skin I87.333 Chronic venous hypertension (idiopathic) with ulcer and  inflammation of bilateral lower extremity L97.221 Non-pressure chronic ulcer of left calf limited to breakdown of skin I89.0 Lymphedema, not elsewhere classified Quantity: 1 Electronic Signature(s) Signed: 06/21/2020 11:21:35 AM By: Lenda Kelp PA-C Entered By: Lenda Kelp on 06/21/2020 11:21:32

## 2020-06-23 DIAGNOSIS — R29898 Other symptoms and signs involving the musculoskeletal system: Secondary | ICD-10-CM | POA: Diagnosis not present

## 2020-06-23 DIAGNOSIS — I890 Lymphedema, not elsewhere classified: Secondary | ICD-10-CM | POA: Diagnosis not present

## 2020-06-27 DIAGNOSIS — B351 Tinea unguium: Secondary | ICD-10-CM | POA: Diagnosis not present

## 2020-06-27 DIAGNOSIS — E1142 Type 2 diabetes mellitus with diabetic polyneuropathy: Secondary | ICD-10-CM | POA: Diagnosis not present

## 2020-06-27 DIAGNOSIS — Z794 Long term (current) use of insulin: Secondary | ICD-10-CM | POA: Diagnosis not present

## 2020-06-28 ENCOUNTER — Encounter (HOSPITAL_BASED_OUTPATIENT_CLINIC_OR_DEPARTMENT_OTHER): Payer: Medicare Other | Admitting: Physician Assistant

## 2020-06-28 DIAGNOSIS — L97512 Non-pressure chronic ulcer of other part of right foot with fat layer exposed: Secondary | ICD-10-CM | POA: Diagnosis not present

## 2020-06-28 DIAGNOSIS — L97211 Non-pressure chronic ulcer of right calf limited to breakdown of skin: Secondary | ICD-10-CM | POA: Diagnosis not present

## 2020-06-28 DIAGNOSIS — Z9119 Patient's noncompliance with other medical treatment and regimen: Secondary | ICD-10-CM | POA: Diagnosis not present

## 2020-06-28 DIAGNOSIS — L03116 Cellulitis of left lower limb: Secondary | ICD-10-CM | POA: Diagnosis not present

## 2020-06-28 DIAGNOSIS — I87333 Chronic venous hypertension (idiopathic) with ulcer and inflammation of bilateral lower extremity: Secondary | ICD-10-CM | POA: Diagnosis not present

## 2020-06-28 DIAGNOSIS — L97221 Non-pressure chronic ulcer of left calf limited to breakdown of skin: Secondary | ICD-10-CM | POA: Diagnosis not present

## 2020-06-28 DIAGNOSIS — L97522 Non-pressure chronic ulcer of other part of left foot with fat layer exposed: Secondary | ICD-10-CM | POA: Diagnosis not present

## 2020-06-28 DIAGNOSIS — I272 Pulmonary hypertension, unspecified: Secondary | ICD-10-CM | POA: Diagnosis not present

## 2020-06-28 DIAGNOSIS — I482 Chronic atrial fibrillation, unspecified: Secondary | ICD-10-CM | POA: Diagnosis not present

## 2020-06-28 DIAGNOSIS — E1140 Type 2 diabetes mellitus with diabetic neuropathy, unspecified: Secondary | ICD-10-CM | POA: Diagnosis not present

## 2020-06-28 DIAGNOSIS — E11622 Type 2 diabetes mellitus with other skin ulcer: Secondary | ICD-10-CM | POA: Diagnosis not present

## 2020-06-28 DIAGNOSIS — I890 Lymphedema, not elsewhere classified: Secondary | ICD-10-CM | POA: Diagnosis not present

## 2020-06-29 NOTE — Progress Notes (Signed)
MARTELL, MCFADYEN ([id number]) Visit Report for 06/28/2020 Chief Complaint Document Details Patient Name: Date of Service: [name], [name] 06/28/2020 10:15 A M Medical Record Number: [id number] Patient Account Number: [id number] Date of Birth/Sex: Treating RN: Jun 16, 1951 (69 y.o. [name] Primary Care Provider: Marney Setting, [name] Other Clinician: Referring Provider: Treating Provider/Extender: Lenda Kelp [facility] [name], [name] in Treatment: 231 Information Obtained from: Patient Chief Complaint patient is here for evaluation venous/lymphedema weeping Electronic Signature(s) Signed: 06/28/2020 10:40:50 AM By: Lenda Kelp PA-C Entered By: Lenda Kelp on 06/28/2020 10:40:50 -------------------------------------------------------------------------------- HPI Details Patient Name: Date of Service: Kevin [name], [name] 06/28/2020 10:15 A M Medical Record Number: [id number] Patient Account Number: [id number] Date of Birth/Sex: Treating RN: 1951/06/03 (69 y.o. [name] Primary Care Provider: Marney Setting, [name] Other Clinician: Referring Provider: Treating Provider/Extender: Lenda Kelp [facility] [name], [name] in Treatment: 231 History of Present Illness HPI Description: Referred by PCP for consultation. Patient has long standing history of BLE venous stasis, no prior ulcerations. At beginning of month, developed cellulitis and weeping. Received IM Rocephin followed by Keflex and resolved. Wears compression stocking, appr 6 months old. Not sure strength. No present drainage. 01/22/16 this is a patient who is a type II diabetic on insulin. 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 He tells me he religiously wears pressure stockings of uncertain strength. He was here with weeping edema about 8 months ago but did not have an open wound. Roughly a month ago he had a reopening on his bilateral legs. He is been using bandages and Neosporin. He  does not complain of pain. 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 Last BUN/creatinine I have is from 11/20/15 at 13 and 1.0 respectively 01/29/16; patient arrives today having tolerated the Profore wrap. He brought in his stockings and these are 18 mmHg stockings he bought from [employer]. The compression here is likely inadequate. He does not complain of pain or excessive drainage she has no systemic symptoms. The wound on the right looks improved as does the one on the left although one on the left is more substantial with still tissue at risk below the actual wound area on the bilateral posterior calf 02/05/16; patient arrives with poor edema control. He states that we did put a 4 layer compression on it last week. No weight appear 5 this. 02/12/16; the area on the posterior right Has healed. The left Has a substantial wound that has necrotic surface eschar that requires a debridement with a curette. 02/16/16;the patient called or a Nurse visit secondary to increased swelling. He had been in earlier in the week with his right leg healed. He was transitioned to is on pressure stocking on the right leg with the only open wound on the left, a substantial area on the left posterior calf. Note he has a history of severe lower extremity edema, he has a history of chronic atrial fibrillation but not heart failure per my notes but I'll need to research this. He is not complaining of chest pain shortness of breath or orthopnea. The intake nurse noted blisters on the previously closed right leg 02/19/16; this is the patient's regular visit day. I see him on Friday with escalating edema new wounds on the right leg and clear signs of at least right ventricular heart failure. I increased his Lasix to  40 twice a day. He is returning currently in follow-up. States he is noticed a decrease in that the  edema 02/26/16 patient's legs have much less edema. There is nothing really open on the right leg. The left leg has improved condition of the large superficial wound on the posterior left leg 03/04/16; edema control is very much better. The patient's right leg wounds have healed. On the left leg he continues to have severe venous inflammation on the posterior aspect of the left leg. There is no tenderness and I don't think any of this is cellulitis. 03/11/16; patient's right leg is married healed and he is in his own stocking. The patient's left leg has deteriorated somewhat. There is a lot of erythema around the wound on the posterior left leg. There is also a significant rim of erythema posteriorly just above where the wrap would've ended there is a new wound in this location and a lot of tenderness. Can't rule out cellulitis in this area. 03/15/16; patient's right leg remains healed and he is in his own stocking. The patient's left leg is much better than last review. His major wound on the posterior aspect of his left Is almost fully epithelialized. He has 3 small injuries from the wraps. Really. Erythema seems a lot better on antibiotics 03/18/16; right leg remains healed and he is in his own stocking. The patient's left leg is much better. The area on the posterior aspect of the left calf is fully epithelialized. His 3 small injuries which were wrap injuries on the left are improved only one seems still open his erythema has resolved 03/25/16; patient's right leg remains healed and he is in his own stocking. There is no open area today on the left leg posterior leg is completely closed up. His wrap injuries at the superior aspect of his leg are also resolved. He looks as though he has some irritation on the dorsal ankle but this is fully epithelialized without evidence of infection. 03/28/16; we discharged this patient on Monday. Transitioned him into his own stocking. There were problems almost  immediately with uncontrolled swelling weeping edema multiple some of which have opened. He does not feel systemically unwell in particular no chest pain no shortness of breath and he does not feel 04/08/16; the edema is under better control with the Profore light wrap but he still has pitting edema. There is one large wound anteriorly 2 on the medial aspect of his left leg and 3 small areas on the superior posterior calf. Drainage is not excessive he is tolerating a Profore light well 04/15/16; put a Profore wrap on him last week. This is controlled is edema however he had a lot of pain on his left anterior foot most of his wounds are healed 04/22/16 once again the patient has denuded areas on the left anterior foot which he states are because his wrap slips up word. He saw his primary physician today is on Lasix 40 twice a day and states that he his weight is down 20 pounds over the last 3 months. 04/29/16: Much improved. left anterior foot much improved. He is now on Lasix 80 mg per day. Much improved edema control 05/06/16; I was hoping to be able to discharge him today however once again he has blisters at a low level of where the compression was placed last week mostly on his left lateral but also his left medial leg and a small area on the anterior part of the left foot. 05/09/16;  apparently the patient went home after his appointment on 7/4 later in the evening developing pain in his upper medial thigh together with subjective fever and chills although his temperature was not taken. The pain was so intense he felt he would probably have to call 911. However he then remembered that he had leftover doxycycline from a previous round of antibiotics and took these. By the next morning he felt a lot better. He called and spoke to one of our nurses and I approved doxycycline over the phone thinking that this was in relation to the wounds we had previously seen although they were definitely were not. The  patient feels a lot better old fever no chills he is still working. Blood sugars are reasonably controlled 05/13/16; patient is back in for review of his cellulitis on his anterior medial upper thigh. He is taking doxycycline this is a lot better. Culture I did of the nodular area on the dorsal aspect of his foot grew MRSA this also looks a lot better. 05/20/16; the patient is cellulitis on the medial upper thigh has resolved. All of his wound areas including the left anterior foot, areas on the medial aspect of the left calf and the lateral aspect of the calf at all resolved. He has a new blister on the left dorsal foot at the level of the fourth toe this was excised. No evidence of infection 05/27/16; patient continues to complain weeping edema. He has new blisterlike wounds on the left anterior lateral and posterior lateral calf at the top of his wrap levels. The area on his left anterior foot appears better. He is not complaining of fever, pain or pruritus in his feet. 05/30/16; the patient's blisters on his left anterior leg posterior calf all look improved. He did not increase the Lasix 100 mg as I suggested because he was going to run out of his 40 mg tablets. He is still having weeping edema of his toes 06/03/16; I renewed his Lasix at 80 mg once a day as he was about to run out when I last saw him. He is on 80 mg of Lasix now. I have asked him to cut down on the excessive amount of water he was drinking and asked him to drink according to his thirst mechanisms 06/12/2016 -- was seen 2 days ago and was supposed to wear his compression stockings at home but he is developed lymphedema and superficial blisters on the left lower extremity and hence came in for a review 06/24/16; the remaining wound is on his left anterior leg. He still has edema coming from between his toes. There is lymphedema here however his edema is generally better than when I last saw this. He has a history of atrial fibrillation  but does not have a known history of congestive heart failure nevertheless I think he probably has this at least on a diastolic basis. 07/01/16 I reviewed his echocardiogram from January 2017. This was essentially normal. He did not have LVH, EF of 55-60%. His right ventricular function was normal although he did have trivial tricuspid and pulmonic regurgitation. This is not audible on exam however. I increased his Lasix to do massive edema in his legs well above his knees I think in early July. He was also drinking an excessive amount of water at the time. 07/15/16; missed his appointment last week because of the Labor Day holiday on Monday. He could not get another appointment later in the week. Started to feel the wrap  digging in superiorly so we remove the top half and the bottom half of his wrap. He has extensive erythema and blistering superiorly in the left leg. Very tender. Very swollen. Edema in his foot with leaking edema fluid. He has not been systemically unwell 07/22/16; the area on the left leg laterally required some debridement. The medial wounds look more stable. His wrap injury wounds appear to have healed. Edema and his foot is better, weeping edema is also better. He tells me he is meeting with the supplier of the external compression pumps at work 08/05/16; the patient was on vacation last week in [facility]. His wrap is been on for an extended period of time. Also over the weekend he developed an extensive area of tender erythema across his anterior medial thigh. He took to doxycycline yesterday that he had leftover from a previous prescription. The patient complains of weeping edema coming out of his toes 08/08/16; I saw this patient on 10/2. He was tender across his anterior thigh. I put him on doxycycline. He returns today in follow-up. He does not have any open wounds on his lower leg, he still has edema weeping into his toes. 08/12/16; patient was seen back urgently today to  follow-up for his extensive left thigh cellulitis/erysipelas. He comes back with a lot less swelling and erythema pain is much better. I believe I gave him Augmentin and Cipro. His wrap was cut down as he stated a roll down his legs. He developed blistering above the level of the wrap that remained. He has 2 open blisters and 1 intact. 08/19/16; patient is been doing his primary doctor who is increased his Lasix from 40-80 once a day or 80 already has less edema. Cellulitis has remained improved in the left thigh. 2 open areas on the posterior left calf 08/26/16; he returns today having new open blisters on the anterior part of his left leg. He has his compression pumps but is not yet been shown how to use some vital representative from the supplier. 09/02/16 patient returns today with no open wounds on the left leg. Some maceration in his plantar toes 09/10/2016 -- Dr. Leanord Hawking had recently discharged him on 09/02/2016 and he has come right back with redness swelling and some open ulcers on his left lower extremity. He says this was caused by trying to apply his compression stockings and he's been unable to use this and has not been able to use his lymphedema pumps. He had some doxycycline leftover and he has started on this a few days ago. 09/16/16; there are no open wounds on his leg on the left and no evidence of cellulitis. 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 He does not complain of symptoms here. I am not clear use using his external compression pumps. 09/23/16; I have not seen this patient in 2 weeks. He canceled his appointment 10 days ago as he was going on vacation. He tells me that on Monday he noticed a large area on his posterior left leg which is been draining copiously and is reopened into a large wound. He is been using ABDs and the external part of his juxtalite, according to our nurse this was not on properly. 10/07/16;  Still a substantial area on the posterior left leg. Using silver alginate 10/14/16; in general better although there is still open area which looks healthy. Still using silver alginate. He reminds me that this happen before he  left for [facility]. T oday while he was showering in the morning. He had been using his juxtalite's 10/21/16; the area on his posterior left leg is fully epithelialized. However he arrives today with a large area of tender erythema in his medial and posterior left thigh just above the knee. I have marked the area. Once again he is reluctant to consider hospitalization. I treated him with oral antibiotics in the past for a similar situation with resolution I think with doxycycline however this area it seems more extensive to me. He is not complaining of fever but does have chills and says states he is thirsty. His blood sugar today was in the 140s at home 10/25/16 the area on his posterior left leg is fully epithelialized although there is still some weeping edema. The large area of tenderness and erythema in his medial and posterior left thigh is a lot less tender although there is still a lot of swelling in this thigh. He states he feels a lot better. He is on doxycycline and Augmentin that I started last week. This will continued until Tuesday, December 26. I have ordered a duplex ultrasound of the left thigh rule out DVT whether there is an abscess something that would need to be drained I would also like to know. 11/01/16; he still has weeping edema from a not fully epithelialized area on his left posterior calf. Most of the rest of this looks a lot better. He has completed his antibiotics. His thigh is a lot better. Duplex ultrasound did not show a DVT in the thigh 11/08/16; he comes in today with more Denuded surface epithelium from the posterior aspect of his calf. There is no real evidence of cellulitis. The superior aspect of his wrap appears to have put quite an  indentation in his leg just below the knee and this may have contributed. He does not complain of pain or fever. We have been using silver alginate as the primary dressing. The area of cellulitis in the right thigh has totally resolved. He has been using his compression stockings once a week 11/15/16; the patient arrives today with more loss of epithelium from the posterior aspect of his left calf. He now has a fairly substantial wound in this area. The reason behind this deterioration isn't exactly clear although his edema is not well controlled. He states he feels he is generally more swollen systemically. He is not complaining of chest pain shortness of breath fever. T me he has an appointment with his primary physician in early February. He is on 80 mg of oral ells Lasix a day. He claims compliance with the external compression pumps. He is not having any pain in his legs similar to what he has with his recurrent cellulitis 11/22/16; the patient arrives a follow-up of his large area on his left lateral calf. This looks somewhat better today. He came in earlier in the week for a dressing change since I saw him a week ago. He is not complaining of any pain no shortness of breath no chest pain 11/28/16; the patient arrives for follow-up of his large area on the left lateral calf this does not look better. In fact it is larger weeping edema. The surface of the wound does not look too bad. We have been using silver alginate although I'm not certain that this is a dressing issue. 12/05/16; again the patient follows up for a large wound on the left lateral and left posterior calf this does not  look better. There continues to be weeping edema necrotic surface tissue. More worrisome than this once again there is erythema below the wound involving the distal Achilles and heel suggestive of cellulitis. He is on his feet working most of the day of this is not going well. We are changing his dressing twice a week to  facilitate the drainage. 12/12/16; not much change in the overall dimensions of the large area on the left posterior calf. This is very inflamed looking. I gave him an. Doxycycline last week does not really seem to have helped. He found the wrap very painful indeed it seems to of dog into his legs superiorly and perhaps around the heel. He came in early today because the drainage had soaked through his dressings. 12/19/16- patient arrives for follow-up evaluation of his left lower extremity ulcers. He states that he is using his lymphedema pumps once daily when there is "no drainage". He admits to not using his lipedema pumps while under current treatment. His blood sugars have been consistently between 150-200. 12/26/16; the patient is not using his compression pumps at home because of the wetness on his feet. I've advised him that I think it's important for him to use this daily. He finds his feet too wet, he can put a plastic bag over his legs while he is in the pumps. Otherwise I think will be in a vicious circle. We are using silver alginate to the major area on his left posterior calf 01/02/17; the patient's posterior left leg has further of all into 3 open wounds. All of them covered with a necrotic surface. He claims to be using his compression pumps once a day. His edema control is marginal. Continue with silver alginate 01/10/17; the patient's left posterior leg actually looks somewhat better. There is less edema, less erythema. Still has 3 open areas covered with a necrotic surface requiring debridement. He claims to be using his compression pumps once a day his edema control is better 01/17/17; the patient's left posterior calf look better last week when I saw him and his wrap was changed 2 days ago. He has noted increasing pain in the left heel and arrives today with much larger wounds extensive erythema extending down into the entire heel area especially tender medially. He is not  systemically unwell CBGs have been controlled no fever. Our intake nurse showed me limegreen drainage on his AVD pads. 01/24/17; his usual this patient responds nicely to antibiotics last week giving him Levaquin for presumed Pseudomonas. The whole entire posterior part of his leg is much better much less inflamed and in the case of his Achilles heel area much less tender. He has also had some epithelialization posteriorly there are still open areas here and still draining but overall considerably better 01/31/17- He has continue to tolerate the compression wraps. he states that he continues to use the lymphedema pumps daily, and can increase to twice daily on the weekends. He is voicing no complaints or concerns regarding his LLE ulcers 02/07/17-he is here for follow-up evaluation. He states that he noted some erythema to the left medial and anterior thigh, which he states is new as of yesterday. He is concerned about recurrent cellulitis. He states his blood sugars have been slightly elevated, this morning in the 180s 02/14/17; he is here for follow-up evaluation. When he was last here there was erythema superiorly from his posterior wound in his anterior thigh. He was prescribed Levaquin however a culture of the wound surface  grew MRSA over the phone I changed him to doxycycline on Monday and things seem to be a lot better. 02/24/17; patient missed his appointment on Friday therefore we changed his nurse visit into a physician visit today. Still using silver alginate on the large area of the posterior left thigh. He isn't new area on the dorsal left second toe 03/03/17; actually better today although he admits he has not used his external compression pumps in the last 2 days or so because of work responsibilities over the weekend. 03/10/17; continued improvement. External compression pumps once a day almost all of his wounds have closed on the posterior left calf. Better edema control 03/17/17; in general  improved. He still has 3 small open areas on the lateral aspect of his left leg however most of the area on the posterior part of his leg is epithelialized. He has better edema control. He has an ABD pad under his stocking on the right anterior lower leg although he did not let us look at that today. 03/24/17; patient arrives back in clinic today with no open areas however there are areas on the posterior left calf and anterior left calf that are less than 100% epithelialized. His edema is well controlled in the left lower leg. There is some pitting edema probably lymphedema in the left upper thigh. He uses compression pumps at home once per day. I tried to get him to do this twice a day although he is very reticent. 04/01/2017 -- for the last 2 days he's had significant redness, tenderness and weeping and came in for an urgent visit today. 04/07/17; patient still has 6 more days of doxycycline. He was seen by Dr. Meyer Russel last Wednesday for cellulitis involving the posterior aspect, lateral aspect of his Involving his heel. For the most part he is better there is less erythema and less weeping. He has been on his feet for 12 hours 2 over the weekend. Using his compression pumps once a day 04/14/17 arrives today with continued improvement. Only one area on the posterior left calf that is not fully epithelialized. He has intense bilateral venous inflammation associated with his chronic venous insufficiency disease and secondary lymphedema. We have been using silver alginate to the left posterior calf wound In passing he tells [place] today that the right leg but we have not seen in quite some time has an open area on it but he doesn't want [place] to look at this today states he will show this to [place] next week. 04/21/17; there is no open area on his left leg although he still reports some weeping edema. He showed [place] his right leg today which is the first time we've seen this leg in a long time. He has a large area of  open wound on the right leg anteriorly healthy granulation. Quite a bit of swelling in the right leg and some degree of venous inflammation. He told us about the right leg in passing last week but states that deterioration in the right leg really only happened over the weekend 04/28/17; there is no open area on the left leg although there is an irritated part on the posterior which is like a wrap injury. The wound on the right leg which was new from last week at least to [place] is a lot better. 05/05/17; still no open area on the left leg. Patient is using his new compression stocking which seems to be doing a good job of controlling the edema. He states he  is using his compression pumps once per day. The right leg still has an open wound although it is better in terms of surface area. Required debridement. A lot of pain in the posterior right Achilles marked tenderness. Usually this type of presentation this patient gives concern for an active cellulitis 05/12/17; patient arrives today with his major wound from last week on the right lateral leg somewhat better. Still requiring debridement. He was using his compression stocking on the left leg however that is reopened with superficial wounds anteriorly he did not have an open wound on this leg previously. He is still using his juxta light's once daily at night. He cannot find the time to do this in the morning as he has to be at work by 7 AM 05/19/17; right lateral leg wound looks improved. No debridement required. The concerning area is on the left posterior leg which appears to almost have a subcutaneous hemorrhagic component to it. We've been using silver alginate to all the wounds 05/26/17; the right lateral leg wound continues to look improved. However the area on the left posterior calf is a tightly adherent surface. [place] using silver alginate. Because of the weeping edema in his legs there is very little good alternatives. 06/02/17; the patient left  here last week looking quite good. Major wound on the left posterior calf and a small one on the right lateral calf. Both of these look satisfactory. He tells me that by Wednesday he had noted increased pain in the left leg and drainage. He called on Thursday and Friday to get an appointment here but we were blocked. He did not go to urgent care or his primary physician. He thinks he had a fever on Thursday but did not actually take his temperature. He has not been using his compression pumps on the left leg because of pain. I advised him to go to the emergency room today for IV antibiotics for stents of left leg cellulitis but he has refused I have asked him to take 2 days off work to keep his leg elevated and he has refused this as well. In view of this I'm going to call him and Augmentin and doxycycline. 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 However after a detailed discussion regarding the risk benefits and alternatives he refuses to get admitted to the hospital. With no other recourse I will continue him on oral antibiotics as before and hopefully he'll have his infectious disease consultation this week. 06/16/2017 -- the patient was seen today by the nurse practitioner at infectious disease Ms. Dixon. 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 He has also been advise Lotrimin cream locally. she also had wise IV antibiotics for his condition if it flares up 06/23/17; patient arrives today with drainage bilaterally although the remaining wound on the left posterior calf after  cleaning up today "highlighter yellow drainage" did not look too bad. Unfortunately he has had breakdown on the right anterior leg [previously this leg had not been open and he is using a black stocking] he went  to see infectious disease and is been put on clindamycin 150 mg daily, I did not verify the dose although I'm not familiar with using clindamycin in this dosing range, perhaps for prophylaxisoo 06/27/17; I brought this patient back today to follow-up on the wound deterioration on the right lower leg together with surrounding cellulitis. I started him on doxycycline 4 days ago. This area looks better however he comes in today with intense cellulitis on the medial part of his left thigh. This is not have a wound in this area. Extremely tender. We've been using silver alginate to the wounds on the right lower leg left lower leg with bilateral 4 layer compression he is using his external compression pumps once a day 07/04/17; patient's left medial thigh cellulitis looks better. He has not been using his compression pumps as his insert said it was contraindicated with cellulitis. His right leg continues to make improvements all the wounds are still open. We only have one remaining wound on the left posterior calf. Using silver alginate to all open areas. He is on doxycycline which I started a week ago and should be finishing I gave him Augmentin after Thursday's visit for the severe cellulitis on the left medial thigh which fortunately looks better 07/14/17; the patient's left medial thigh cellulitis has resolved. The cellulitis in his right lower calf on the right also looks better. All of his wounds are stable to improved we've been using silver alginate he has completed the antibiotics I have given him. He has clindamycin 150 mg once a day prescribed by infectious disease for prophylaxis, I've advised him to start this now. We have been using bilateral Unna boots over silver alginate to the wound  areas 07/21/17; the patient is been to see infectious disease who noted his recurrent problems with cellulitis. He was not able to tolerate prophylactic clindamycin therefore he is on amoxicillin 500 twice a day. He also had a second daily dose of Lasix added By Dr. Oneta Rack but he is not taking this. Nor is he being completely compliant with his compression pumps a especially not this week. He has 2 remaining wounds one on the right posterior lateral lower leg and one on the left posterior medial lower leg. 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 The patient has Unna boots bilaterally. 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 He has bilateral Unna boots for compression and silver alginate to his wounds. Arrives today with his legs looking as good as I have seen him in quite some time. Not surprisingly his wounds look better as well with improvement on the right lateral leg venous insufficiency wound and also the left medial leg. He is still using the compression pumps once a day 08/11/17; both legs appear to be doing better wounds on the right lateral and left medial legs look better. Skin on the right leg quite good. He is been using silver alginate as the primary dressing. I'm going to use Anasept gel calcium alginate and maintain all the secondary dressings 08/18/17; the patient continues to actually do quite well. The area on his right lateral leg is just about closed the left medial also looks better although it is still moist in this area. His edema is well controlled we have been using Anasept gel  with calcium alginate and the usual secondary dressings, 4 layer compression and once daily use of his compression pumps "always been able to manage 09/01/17; the patient  continues to do reasonably well in spite of his trip to [place]. The area on the right lateral leg is epithelialized. Left is much better but still open. He has more edema and more chronic erythema on the left leg [venous inflammation] 09/08/17; he arrives today with no open wound on the right lateral leg and decently controlled edema. Unfortunately his left leg is not nearly as in his good situation as last week.he apparently had increasing edema starting on Saturday. He edema soaked through into his foot so used a plastic bag to walk around his home. The area on the medial right leg which was his open area is about the same however he has lost surface epithelium on the left lateral which is new and he has significant pain in the Achilles area of the left foot. He is already on amoxicillin chronically for prophylaxis of cellulitis in the left leg 09/15/17; he is completed a week of doxycycline and the cellulitis in the left posterior leg and Achilles area is as usual improved. He still has a lot of edema and fluid soaking through his dressings. There is no open wound on the right leg. He saw infectious disease NP today 09/22/17;As usual 1 we transition him from our compression wraps to his stockings things did not go well. He has several small open areas on the right leg. He states this was caused by the compression wrap on his skin although he did not wear this with the stockings over them. He has several superficial areas on the left leg medially laterally posteriorly. He does not have any evidence of active cellulitis especially involving the left Achilles The patient is traveling from [facility] Saturday going to [facility]. He states he isn't attempting to get an appointment with a heel objects wound center there to change his dressings. I am not completely certain whether this will work 10/06/17; the patient came in on Friday for a nurse visit and the nurse reported that his legs actually look  quite good. He arrives in clinic today for his regular follow-up visit. He has a new wound on his left third toe over the PIP probably caused by friction with his footwear. He has small areas on the left leg and a very superficial but epithelialized area on the right anterior lateral lower leg. Other than that his legs look as good as I've seen him in quite some time. We have been using silver alginate Review of systems; no chest pain no shortness of breath other than this a 10 point review of systems negative 10/20/17; seen by Dr. Meyer Russel last week. He had taken some antibiotics [doxycycline] that he had left over. Dr. Meyer Russel thought he had candida infection and declined to give him further antibiotics. He has a small wound remaining on the right lateral leg several areas on the left leg including a larger area on the left posterior several left medial and anterior and a small wound on the left lateral. The area on the left dorsal third toe looks a lot better. ROS; Gen.; no fever, respiratory no cough no sputum Cardiac no chest pain other than this 10 point review of system is negative 10/30/17; patient arrives today having fallen in the bathtub 3 days ago. It took him a while to get up. He has pain and maceration in the  wounds on his left leg which have deteriorated. He has not been using his pumps he also has some maceration on the right lateral leg. 11/03/17; patient continues to have weeping edema especially in the left leg. This saturates his dressings which were just put on on 12/27. As usual the doxycycline seems to take care of the cellulitis on his lower leg. He is not complaining of fever, chills, or other systemic symptoms. He states his leg feels a lot better on the doxycycline I gave him empirically. He also apparently gets injections at his primary doctor's officeo Rocephin for cellulitis prophylaxis. I didn't ask him about his compression pump compliance today I think that's probably  marginal. Arrives in the clinic with all of his dressings primary and secondary macerated full of fluid and he has bilateral edema 11/10/17; the patient's right leg looks some better although there is still a cluster of wounds on the right lateral. The left leg is inflamed with almost circumferential skin loss medially to laterally although we are still maintaining anteriorly. He does not have overt cellulitis there is a lot of drainage. He is not using compression pumps. We have been using silver alginate to the wound areas, there are not a lot of options here 11/17/17; the patient's right leg continues to be stable although there is still open wounds, better than last week. The inflammation in the left leg is better. Still loss of surface layer epithelium especially posteriorly. There is no overt cellulitis in the amount of edema and his left leg is really quite good, tells me he is using his compression pumps once a day. 11/24/17; patient's right leg has a small superficial wound laterally this continues to improve. The inflammation in the left leg is still improving however we have continuous surface layer epithelial loss posteriorly. There is no overt cellulitis in the amount of edema in both legs is really quite good. He states he is using his compression pumps on the left leg once a day for 5 out of 7 days 12/01/17; very small superficial areas on the right lateral leg continue to improve. Edema control in both legs is better today. He has continued loss of surface epithelialization and left posterior calf although I think this is better. We have been using silver alginate with large number of absorptive secondary dressings 4 layer on the left Unna boot on the right at his request. He tells me he is using his compression pumps once a day 12/08/17; he has no open area on the right leg is edema control is good here. On the left leg however he has marked erythema and tenderness breakdown of skin. He has  what appears to be a wrap injury just distal to the popliteal fossa. 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 There was swelling erythema and breakdown in the right leg. He has much worse skin breakdown in the left leg as well multiple open areas medially and posteriorly as well as laterally. He tells me he has been using his compression pumps but tells me he feels that the drainage out of his leg is worse when he uses a compression pumps. T be fair to him he is been saying this o for a  while however I don't know that I have really been listening to this. I wonder if the compression pumps are working properly 12/22/17;. Once again he arrives with severe erythema, weeping edema from the left greater than right leg. Noncompliance with compression pumps. New this visit he is complaining of pain on the lateral aspect of the right leg and the medial aspect of his right thigh. He apparently saw his cardiologist Dr. [name] who was ordered an echocardiogram area and I think this is a step in the right direction 12/25/17; started his doxycycline Monday night. There is still intense erythema of the right leg especially in the anterior thigh although there is less tenderness. The erythema around the wound on the right lateral calf also is less tender. He still complaining of pain in the left heel. His wounds are about the same right lateral left medial left lateral. Superficial but certainly not close to closure. He denies being systemically unwell no fever chills no abdominal pain no diarrhea 12/29/17; back in follow-up of his extensive right calf and right thigh cellulitis. I added amoxicillin to cover possible doxycycline resistant  strep. This seems to of done the trick he is in much less pain there is much less erythema and swelling. He has his echocardiogram at 11:00 this morning. X-ray of the left heel was also negative. 01/05/18; the patient arrived with his edema under much better control. Now that he is retired he is able to use his compression pumps daily and sometimes twice a day per the patient. He has a wound on the right leg the lateral wound looks better. Area on the left leg also looks a lot better. He has no evidence of cellulitis in his bilateral thighs I had a quick peak at his echocardiogram. He is in normal ejection fraction and normal left ventricular function. He has moderate pulmonary hypertension moderately reduced right ventricular function. One would have to wonder about chronic sleep apnea although he says he doesn't snore. He'll review the echocardiogram with his cardiologist. 01/12/18; the patient arrives with the edema in both legs under exemplary control. He is using his compression pumps daily and sometimes twice daily. His wound on the right lateral leg is just about closed. He still has some weeping areas on the posterior left calf and lateral left calf although everything is just about closed here as well. I have spoken with Aldean Baker who is the patient's nurse practitioner and infectious disease. She was concerned that the patient had not understood that the parenteral penicillin injections he was receiving for cellulitis prophylaxis was actually benefiting him. I don't think the patient actually saw that I would tend to agree we were certainly dealing with less infections although he had a serious one last month. 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 He is healed. He'll be placed in juxtalite compression wraps and increase his lymphedema pumps to twice daily. We will follow up again next week to ensure there are no issues with the new  regiment. 01/20/18-he is here for evaluation of bilateral lower extremity weeping edema. Yesterday he was placed in compression wrap to the right lower extremity and compression stocking to left lower shrubbery. He states he uses lymphedema pumps last night and again this morning and noted a blister to the left lower extremity. On exam he was noted to have drainage to the right lower extremity. He will be placed in Unna boots bilaterally and follow-up next week 01/26/18; patient was  actually discharged a week ago to his own juxta light stockings only to return the next day with bilateral lower extremity weeping edema.he was placed in bilateral Unna boots. He arrives today with pain in the back of his left leg. There is no open area on the right leg however there is a linear/wrap injury on the left leg and weeping edema on the left leg posteriorly. I spoke with infectious disease about 10 days ago. They were disappointed that the patient elected to discontinue prophylactic intramuscular penicillin shots as they felt it was particularly beneficial in reducing the frequency of his cellulitis. I discussed this with the patient today. He does not share this view. He'll definitely need antibiotics today. Finally he is traveling to [place] and trauma leaving this Saturday and returning a week later and he does not travel with his pumps. He is going by car 01/30/18; patient was seen 4 days ago and brought back in today for review of cellulitis in the left leg posteriorly. I put him on amoxicillin this really hasn't helped as much as I might like. He is also worried because he is traveling to [facility] trauma by car. Finally we will be rewrapping him. There is no open area on the right leg over his left leg has multiple weeping areas as usual 02/09/18; The same wrap on for 10 days. He did not pick up the last doxycycline I prescribed for him. He apparently took 4 days worth he already had. There is nothing open on  his right leg and the edema control is really quite good. He's had damage in the left leg medially and laterally especially probably related to the prolonged use of Unna boots 02/12/18; the patient arrived in clinic today for a nurse visit/wrap change. He complained of a lot of pain in the left posterior calf. He is taking doxycycline that I previously prescribed for him. Unfortunately even though he used his stockings and apparently used to compression pumps twice a day he has weeping edema coming out of the lateral part of his right leg. This is coming from the lower anterior lateral skin area. 02/16/18; the patient has finished his doxycycline and will finish the amoxicillin 2 days. The area of cellulitis in the left calf posteriorly has resolved. He is no longer having any pain. He tells me he is using his compression pumps at least once a day sometimes twice. 02/23/18; the patient finished his doxycycline and Amoxil last week. On Friday he noticed a small erythematous circle about the size of a quarter on the left lower leg just above his ankle. This rapidly expanded and he now has erythema on the lateral and posterior part of the thigh. This is bright red. Also has an area on the dorsal foot just above his toes and a tender area just below the left popliteal fossa. He came off his prophylactic penicillin injections at his own insistence one or 2 months ago. This is obviously deteriorated since then 03/02/18; patient is on doxycycline and Amoxil. Culture I did last week of the weeping area on the back of his left calf grew group B strep. I have therefore renewed the amoxicillin 500 3 times a day for a further week. He has not been systemically unwell. Still complaining of an area of discomfort right under his left popliteal fossa. There is no open wound on the right leg. He tells me that he is using his pumps twice a day on most days 03/09/18; patient arrives in  clinic today completing his amoxicillin  today. The cellulitis on his left leg is better. Furthermore he tells me that he had intramuscular penicillin shots that his primary care office today. However he also states that the wrap on his right leg fell down shortly after leaving clinic last week. He developed a large blister that was present when he came in for a nurse visit later in the week and then he developed intense discomfort around this area.He tells me he is using his compression pumps 03/16/18; the patient has completed his doxycycline. The infectious part of this/cellulitis in the left heel area left popliteal area is a lot better. He has 2 open areas on the right calf. Still areas on the left calf but this is a lot better as well. 03/24/18; the patient arrives complaining of pain in the left popliteal area again. He thinks some of this is wrap injury. He has no open area on the right leg and really no open area on the left calf either except for the popliteal area. He claims to be compliant with the compression pumps 03/31/18; I gave him doxycycline last week because of cellulitis in the left popliteal area. This is a lot better although the surface epithelium is denuded off and response to this. He arrives today with uncontrolled edema in the right calf area as well as a fingernail injury in the right lateral calf. There is only a few open areas on the left 04/06/18; I gave him amoxicillin doxycycline over the last 2 weeks that the amoxicillin should be completing currently. He is not complaining of any pain or systemic symptoms. The only open areas see has is on the right lateral lower leg paradoxically I cannot see anything on the left lower leg. He tells me he is using his compression pumps twice a day on most days. Silver alginate to the wounds that are open under 4 layer compression 04/13/18; he completed antibiotics and has no new complaints. Using his compression pumps. Silver alginate that anything that's opened 04/20/18; he is  using his compression pumps religiously. Silver alginate 4 layer compression anything that's opened. He comes in today with no open wounds on the left leg but 3 on the right including a new one posteriorly. He has 2 on the right lateral and one on the right posterior. He likes Unna boots on the right leg for reasons that aren't really clear we had the usual 4 layer compression on the left. It may be necessary to move to the 4 layer compression on the right however for now I left them in the Unna boots 04/27/18; he is using his compression pumps at least once a day. He has still the wounds on the right lateral calf. The area right posteriorly has closed. He does not have an open wound on the left under 4 layer compression however on the dorsal left foot just proximal to the toes and the left third toe 2 small open areas were identified 05/11/18; he has not uses compression pumps. The areas on the right lateral calf have coalesced into one large wound necrotic surface. On the left side he has one small wound anteriorly however the edema is now weeping out of a large part of his left leg. He says he wasn't using his pumps because of the weeping fluid. I explained to him that this is the time he needs to pump more 05/18/18; patient states he is using his compression pumps twice a day. The area on  the right lateral large wound albeit superficial. On the left side he has innumerable number of small new wounds on the left calf particularly laterally but several anteriorly and medially. All these appear to have healthy granulated base these look like the remnants of blisters however they occurred under compression. The patient arrives in clinic today with his legs somewhat better. There is certainly less edema, less multiple open areas on the left calf and the right anterior leg looks somewhat better as well superficial and a little smaller. However he relates pain and erythema over the last 3-4 days in the thigh  and I looked at this today. He has not been systemically unwell no fever no chills no change in blood sugar values 05/25/18; comes in today in a better state. The severe cellulitis on his left leg seems better with the Keflex. Not as tender. He has not been systemically unwell Hard to find an open wound on the left lower leg using his compression pumps twice a day The confluent wounds on his right lateral calf somewhat better looking. These will ultimately need debridement I didn't do this today. 06/01/18; the severe cellulitis on the left anterior thigh has resolved and he is completed his Keflex. There is no open wound on the left leg however there is a superficial excoriation at the base of the third toe dorsally. Skin on the bottom of his left foot is macerated looking. The left the wounds on the lateral right leg actually looks some better although he did require debridement of the top half of this wound area with an open curet 06/09/18 on evaluation today patient appears to be doing poorly in regard to his right lower extremity in particular this appears to likely be infected he has very thick purulent discharge along with a bright green tent to the discharge. This makes me concerned about the possibility of pseudomonas. He's also having increased discomfort at this point on evaluation. Fortunately there does not appear to be any evidence of infection spreading to the other location at this time. 06/16/18 on evaluation today patient appears to actually be doing fairly well. His ulcer has actually diminished in size quite significantly at this point which is good news. Nonetheless he still does have some evidence of infection he did see infectious disease this morning before coming here for his appointment. I did review the results of their evaluation and their note today. They did actually have him discontinue the Cipro and initiate treatment with linezolid at this time. He is doing this for the  next seven days and they recommended a follow-up in four months with them. He is the keep a log of the need for intermittent antibiotic therapy between now and when he falls back up with infectious disease. This will help them gaze what exactly they need to do to try and help them out. 06/23/18; the patient arrives today with no open wounds on the left leg and left third toe healed. He is been using his compression pumps twice a day. On the right lateral leg he still has a sizable wound but this is a lot better than last time I saw this. 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 Has been using silver alginate under 4 layer compression 06/30/18; the only open wound he has is on the right lateral leg and this looks healthy. No debridement is required. We have  been using silver alginate. He does not have an open wound on the left leg. There is apparently some drainage from the dorsal proximal third toe on the left although I see no open wound here. 07/03/18 on evaluation today patient was actually here just for a nurse visit rapid change. However when he was here on Wednesday for his rat change due to having been healed on the left and then developing blisters we initiated the wrap again knowing that he would be back today for [place] to reevaluate and see were at. Unfortunately he has developed some cellulitis into the proximal portion of his right lower extremity even into the region of his thigh. 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 He was placed on one as what at that point. Nonetheless he is done with that and has been tolerating it well otherwise. Doxycycline which in the past really did not seem to be effective for him. Nonetheless I think the best option may be for [place] to definitely reinitiate the antibiotics for a longer period of time. 07/07/18; since I  last saw this patient a week ago he has had a difficult time. At that point he did not have an open wound on his left leg. We transitioned him into juxta light stockings. He was apparently in the clinic the next day with blisters on the left lateral and left medial lower calf. He also had weeping edema fluid. He was put back into a compression wrap. He was also in the clinic on Friday with intense erythema in his right thigh. Per the patient he was started on Bactrim however that didn't work at all in terms of relieving his pain and swelling. He has taken 3 doxycycline that he had left over from last time and that seems to of helped. He has blistering on the right thigh as well. 07/14/18; the erythema on his right thigh has gotten better with doxycycline that he is finishing. 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 Presumably this cellulitis in the thigh was not related to that although I think this is a bit concerning going forward. He still has an area on the right lateral calf the blister on the right medial calf just below the knee that was discussed above. On the left 2 small open areas left medial and left lateral. Edema control is adequate. He is using his compression pumps twice a day 07/20/18; continued improvement in the condition of both legs especially the edema in his bilateral thighs. He tells me he is been losing weight through a combination of diet and exercise. He is using his compression pumps twice a day. So overall she made to the remaining wounds 07/27/2018; continued improvement in condition of both legs. His edema is well controlled. The area on the right lateral leg is just about closed he had one blisters show up on the medial left upper calf. We have him in 4 layer compression. He is going on a 10-day trip to [place], [place] and [place]. He will be driving. He wants to wear Unna boots because of  the lessening amount of constriction. He will not use compression pumps while he is away 08/05/18 on evaluation today patient actually appears to be doing decently well all things considered in regard to his bilateral lower extremities. The worst ulcer is actually only posterior aspect of  his left lower extremity with a four layer compression wrap cut into his leg a couple weeks back. He did have a trip and actually had [employer] for the trip that he is worn since he was last here. Nonetheless he feels like the [employer] actually do better for him his swelling is up a little bit but he also with his trip was not taking his Lasix on a regular set schedule like he was supposed to be. He states that obviously the reason being that he cannot drive and keep going without having to urinate too frequently which makes it difficult. He did not have his pumps with him while he was away either which I think also maybe playing a role here too. 08/13/2018; the patient only has a small open wound on the right lateral calf which is a big improvement in the last month or 2. He also has the area posteriorly just below the posterior fossa on the left which I think was a wrap injury from several weeks ago. He has no current evidence of cellulitis. He tells me he is back into his compression pumps twice a day. He also tells me that while he was at the laundromat somebody stole a section of his extremitease stockings 08/20/2018; back in the clinic with a much improved state. He only has small areas on the right lateral mid calf which is just about healed. This was is more substantial area for quite a prolonged period of time. He has a small open area on the left anterior tibia. The area on the posterior calf just below the popliteal fossa is closed today. He is using his compression pumps twice a day 08/28/2018; patient has no open wound on the right leg. He has a smattering of open areas on the calf with some  weeping lymphedema. More problematically than that it looks as though his wraps of slipped down in his usual he has very angry upper area of edema just below the right medial knee and on the right lateral calf. He has no open area on his feet. The patient is traveling to [facility] next week. I will send him in an antibiotic. We will continue to wrap the right leg. We ordered extremitease stockings for him last week and I plan to transition the right leg to a stocking when he gets home which will be in 10 days time. As usual he is very reluctant to take his pumps with him when he travels 09/07/2018; patient returns from [facility]. He shows me a picture of his left leg in the mid part of his trip last week with intense fire engine erythema. The picture look bad enough I would have considered sending him to the hospital. Instead he went to the wound care center in [facility]. They did not prescribe him antibiotics but he did take some doxycycline he had leftover from a previous visit. I had given him trimethoprim sulfamethoxazole before he left this did not work according to the patient. This is resulted in some improvement fortunately. He comes back with a large wound on the left posterior calf. Smaller area on the left anterior tibia. Denuded blisters on the dorsal left foot over his toes. Does not have much in the way of wounds on the right leg although he does have a very tender area on the right posterior area just below the popliteal fossa also suggestive of infection. He promises me he is back on his  pumps twice a day 09/15/2018; the intense cellulitis in his left lower calf is a lot better. The wound area on the posterior left calf is also so better. However he has reasonably extensive wounds on the dorsal aspect of his second and third toes and the proximal foot just at the base of the toes. There is nothing open on the right leg 09/22/2018; the patient has excellent edema  control in his legs bilaterally. He is using his external compression pumps twice a day. He has no open area on the right leg and only the areas in the left foot dorsally second and third toe area on the left side. He does not have any signs of active cellulitis. 10/06/2018; the patient has good edema control bilaterally. He has no open wound on the right leg. There is a blister in the posterior aspect of his left calf that we had to deal with today. He is using his compression pumps twice a day. There is no signs of active cellulitis. We have been using silver alginate to the wound areas. He still has vulnerable areas on the base of his left first second toes dorsally He has a his extremities stockings and we are going to transition him today into the stocking on the right leg. He is cautioned that he will need to continue to use the compression pumps twice a day. If he notices uncontrolled edema in the right leg he may need to go to 3 times a day. 10/13/2018; the patient came in for a nurse check on Friday he has a large flaccid blister on the right medial calf just below the knee. We unroofed this. He has this and a new area underneath the posterior mid calf which was undoubtedly a blister as well. He also has several small areas on the right which is the area we put his extremities stocking on. 10/19/2018; the patient went to see infectious disease this morning I am not sure if that was a routine follow-up in any case the doxycycline I had given him was discontinued and started on linezolid. He has not started this. It is easy to look at his left calf and the inflammation and think this is cellulitis however he is very tender in the tissue just below the popliteal fossa and I have no doubt that there is infection going on here. He states the problem he is having is that with the compression pumps the edema goes down and then starts walking the wrap falls down. We will see if we can adhere this. He  has 1 or 2 minuscule open areas on the right still areas that are weeping on the posterior left calf, the base of his left second and third toes 10/26/18; back today in clinic with quite of skin breakdown in his left anterior leg. This may have been infection the area below the popliteal fossa seems a lot better however tremendous epithelial loss on the left anterior mid tibia area over quite inexpensive tissue. He has 2 blisters on the right side but no other open wound here. 10/29/2018; came in urgently to see [place] today and we worked him in for review. He states that the 4 layer compression on the right leg caused pain he had to cut it down to roughly his mid calf this caused swelling above the wrap and he has blisters and skin breakdown today. As a result of the pain he has not been using his pumps. Both legs are a lot more  edematous and there is a lot of weeping fluid. 11/02/18; arrives in clinic with continued difficulties in the right leg> left. Leg is swollen and painful. multiple skin blisters and new open areas especially laterally. He has not been using his pumps on the right leg. 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 He is not systemically unwell. 11/09/2018; the patient claims he is being compliant with his pumps. He is finished the doxycycline I gave him last week. Culture I did of the wound on the right lateral leg showed a few very resistant methicillin staph aureus. This was resistant to doxycycline. Nevertheless he states the pain in the leg is a lot better which makes me wonder if the cultured organism was not really what was causing the problem nevertheless this is a very dangerous organism to be culturing out of any wound. His right leg is still a lot larger than the left. He is using an Radio broadcast assistant on this area, he blames a 4-layer compression for causing the original skin breakdown which I doubt is true however I cannot talk him out  of it. We have been using silver alginate to all of these areas which were initially blisters 11/16/2018; patient is being compliant with his external compression pumps at twice a day. Miraculously he arrives in clinic today with absolutely no open wounds. He has better edema control on the left where he has been using 4 layer compression versus wound of wounds on the right and I pointed this out to him. There is no inflammation in the skin in his lower legs which is also somewhat unusual for him. There is no open wounds on the dorsal left foot. He has extremitease stockings at home and I have asked him to bring these in next week. 11/25/18 patient's lower extremity on examination today on the left appears for the most part to be wound free. He does have an open wound on the lateral aspect of the right lower extremity but this is minimal compared to what I've seen in past. He does request that we go ahead and wrap the left leg as well even though there's nothing open just so hopefully it will not reopen in short order. 1/28; patient has superficial open wounds on the right lateral calf left anterior calf and left posterior calf. His edema control is adequate. 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 We have been using silver alginate as the primary dressing. He claims compliance with his compression pumps 2/4; patient has superficial open wounds on numerous areas of his left calf and again one on the left dorsal foot. The areas on the right lateral calf have healed. The cellulitis that I gave him doxycycline for last week is also resolved this was mostly on the left anterior calf just below the tibial tuberosity. His edema looks fairly well-controlled. He tells me he went to see his primary doctor today and had blood work ordered 2/11; once again he has several open areas on the left calf left tibial area. Most of  these are small and appear to have healthy granulation. He does not have anything open on the right. The edema and control in his thighs is pretty good which is usually a good indication he has been using his pumps as requested. 2/18; he continues to have several small areas on the left calf and left tibial  area. Most of these are small healthy granulation. We put him in his stocking on the right leg last week and he arrives with a superficial open area over the right upper tibia and a fairly large area on the right lateral tibia in similar condition. His edema control actually does not look too bad, he claims to be using his compression pumps twice a day 2/25. Continued small areas on the left calf and left tibial area. New areas especially on the right are identified just below the tibial tuberosity and on the right upper tibia itself. There are also areas of weeping edema fluid even without an obvious wound. He does not have a considerable degree of lymphedema but clearly there is more edema here than his skin can handle. He states he is using the pumps twice a day. We have an Unna boot on the right and 4 layer compression on the left. 3/3; he continues to have an area on the right lateral calf and right posterior calf just below the popliteal fossa. There is a fair amount of tenderness around the wound on the popliteal fossa but I did not see any evidence of cellulitis, could just be that the wrap came down and rubbed in this area. He does not have an open area on the left leg however there is an area on the left dorsal foot at the base of the third toe We have been using silver alginate to all wound areas 3/10; he did not have an open area on his left leg last time he was here a week ago. T oday he arrives with a horizontal wound just below the tibial tuberosity and an area on the left lateral calf. He has intense erythema and tenderness in this area. The area is on the right lateral calf and  right posterior calf better than last week. We have been using silver alginate as usual 3/18 - Patient returns with 3 small open areas on left calf, and 1 small open area on right calf, the skin looks ok with no significant erythema, he continues the UNA boot on right and 4 layer compression on left. The right lateral calf wound is closed , the right posterior is small area. we will continue silver alginate to the areas. 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 He is been tolerating the dressing changes without complication. Fortunately she has made excellent improvement in regard to the overall status of his wounds. Unfortunately every time we cease wrapping him he ends up reopening in causing more significant issues at that point. Again I'm unsure of the best direction to take although I think the lymphedema clinic may be appropriate for him. 02/03/19 on evaluation today patient appears to be doing well in regard to the wounds that we saw him for last week unfortunately he has a new area on the proximal portion of his right medial/posterior lower extremity where the wrap somewhat slowed down and caused swelling and a blister to rub and open. Unfortunately this is the only opening that he has on either leg at this point. 02/17/19 on evaluation today patient's bilateral lower extremities appear to be doing well. He still completely healed in regard to the left lower extremity. In regard to the right lower extremity the area where the wrap and slid down  and caused the blister still seems to be slightly open although this is dramatically better than during the last evaluation two weeks ago. I'm very pleased with the way this stands overall. 03/03/19 on evaluation today patient appears to be doing well in regard  to his right lower extremity in general although he did have a new blister open this does not appear to be showing any evidence of active infection at this time. Fortunately there's No fevers, chills, nausea, or vomiting noted at this time. Overall I feel like he is making good progress it does feel like that the right leg will we perform the [employer] seems to do with a bit better than three layer wrap on the left which slid down on him. We may switch to doing bilateral in the book wraps. 5/4; I have not seen Mr. Zelada in quite some time. According to our case manager he did not have an open wound on his left leg last week. He had 1 remaining wound on the right posterior medial calf. He arrives today with multiple openings on the left leg probably were blisters and/or wrap injuries from Unna boots. I do not think the Unna boot's will provide adequate compression on the left. I am also not clear about the frequency he is using the compression pumps. 03/17/19 on evaluation today patient appears to be doing excellent in regard to his lower extremities compared to last week's evaluation apparently. He had gotten significantly worse last week which is unfortunate. The [employer] wrap on the left did not seem to do very well for him at all and in fact it didn't control his swelling significantly enough he had an additional outbreak. Subsequently we go back to the four layer compression wrap on the left. This is good news. At least in that he is doing better and the wound seem to be killing him. He still has not heard anything from the lymphedema clinic. 03/24/19 on evaluation today patient actually appears to be doing much better in regard to his bilateral lower Trinity as compared to last week when I saw him. Fortunately there's no signs of active infection at this time. He has been tolerating the dressing changes without complication. Overall I'm extremely pleased with the progress and appearance in  general. 04/07/19 on evaluation today patient appears to be doing well in regard to his bilateral lower extremities. His swelling is significantly down from where it was previous. With that being said he does have a couple blisters still open at this point but fortunately nothing that seems to be too severe and again the majority of the larger openings has healed at this time. 04/14/19 on evaluation today patient actually appears to be doing quite well in regard to his bilateral lower extremities in fact I'm not even sure there's anything significantly open at this time at any site. Nonetheless he did have some trouble with these wraps where they are somewhat irritating him secondary to the fact that he has noted that the graph wasn't too close down to the end of this foot in a little bit short as well up to his knee. Otherwise things seem to be doing quite well. 04/21/19 upon evaluation today patient's wound bed actually showed evidence of being completely healed in regard to both lower extremities which is excellent news. There does not appear to be any signs of active infection which is also good news. I'm very pleased in this regard. No fevers, chills, nausea, or vomiting noted  at this time. 04/28/19 on evaluation today patient appears to be doing a little bit worse in regard to both lower extremities on the left mainly due to the fact that when he went infection disease the wrap was not wrapped quite high enough he developed a blister above this. On the right he is a small open area of nothing too significant but again this is continuing to give him some trouble he has been were in the Velcro compression that he has at home. 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 He's been tolerating the dressing changes without complication. Fortunately there's no signs of active infection at this time. No fevers, chills, nausea, or vomiting noted at  this time. We have been trying to get an appointment with her lymphedema clinic in [facility] but unfortunately nobody can get them on phone with not been able to even fax information over the patient likewise is not been able to get in touch with them. Overall I'm not sure exactly what's going on here with to reach out again today. 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 Still having a lot of drainage unfortunately. He tells me especially in the left but even on the right. There's no signs of active infection which is good news we've been using so ratcheted up to this point. 05/19/19 on evaluation today patient actually appears to be doing quite well with regard to his left lower extremity which is great news. Fortunately in regard to the right lower extremity has an issues with his wrap and he subsequently did remove this from what I'm understanding. Nonetheless long story short is what he had rewrapped once he removed it subsequently had maggots underneath this wrap whenever he came in for evaluation today. With that being said they were obviously completely cleaned away by the nursing staff. The visit today which is excellent news. However he does appear to potentially have some infection around the right ankle region where the maggots were located as well. He will likely require anabiotic therapy today. 05/26/19 on evaluation today patient actually appears to be doing much better in regard to his bilateral lower extremities. I feel like the infection is under much better control. With that being said there were maggots noted when the wrap was removed yet again today. Again this could have potentially been left over from previous although at this time there does not appear to be any signs of significant drainage there was obviously on the wrap some drainage as well this contracted gnats or  otherwise. Either way I do not see anything that appears to be doing worse in my pinion and in fact I think his drainage has slowed down quite significantly likely mainly due to the fact to his infection being under better control. 06/02/2019 on evaluation today patient actually appears to be doing well with regard to his bilateral lower extremities there is no signs of active infection at this time which is great news. With that being said he does have several open areas more so on the right than the left but nonetheless these are all significantly better than previously noted. 06/09/2019 on evaluation today patient actually appears to be doing well. His wrap stayed up and he did not cause any problems he had more drainage on the right compared to the left but overall I do not see  any major issues at this time which is great news. 06/16/2019 on evaluation today patient appears to be doing excellent with regard to his lower extremities the only area that is open is a new blister that can have opened as of today on the medial ankle on the left. Other than this he really seems to be doing great I see no major issues at this point. 06/23/2019 on evaluation today patient appears to be doing quite well with regard to his bilateral lower extremities. In fact he actually appears to be almost completely healed there is a small area of weeping noted of the right lower extremity just above the ankle. Nonetheless fortunately there is no signs of active infection at this time which is good news. No fevers, chills, nausea, vomiting, or diarrhea. 8/24; the patient arrived for a nurse visit today but complained of very significant pain in the left leg and therefore I was asked to look at this. Noted that he did not have an open area on the left leg last week nevertheless this was wrapped. The patient states that he is not been able to put his compression pumps on the left leg because of the discomfort. He has not been  systemically unwell 06/30/2019 on evaluation today patient unfortunately despite being excellent last week is doing much worse with regard to his left lower extremity today. In fact he had to come in for a nurse on Monday where his left leg had to be rewrapped due to excessive weeping Dr. Leanord Hawking placed him on doxycycline at that point. Fortunately there is no signs of active infection Systemically at this time which is good news. 07/07/2019 in regard to the patient's wounds today he actually seems to be doing well with his right lower extremity there really is nothing open or draining at this point this is great news. Unfortunately the left lower extremity is given him additional trouble at this time. There does not appear to be any signs of active infection nonetheless he does have a lot of edema and swelling noted at this point as well as blistering all of which has led to a much more poor appearing leg at this time compared to where it was 2 weeks ago when it was almost completely healed. Obviously this is a little discouraging for the patient. He is try to contact the lymphedema clinic in [place] he has not been able to get through to them. 07/14/2019 on evaluation today patient actually appears to be doing slightly better with regard to his left lower extremity ulcers. Overall I do feel like at least at the top of the wrap that we have been placing this area has healed quite nicely and looks much better. The remainder of the leg is showing signs of improvement. Unfortunately in the thigh area he still has an open region on the left and again on the right he has been utilizing just a Band-Aid on an area that also opened on the thigh. Again this is an area that were not able to wrap although we did do an Ace wrap to provide some compression that something that obviously is a little less effective than the compression wraps we have been using on the lower portion of the leg. He does have an  appointment with the lymphedema clinic in [facility] on Friday. 07/21/2019 on evaluation today patient appears to be doing better with regard to his lower extremity ulcers. He has been tolerating the dressing changes without complication. Fortunately there is  no signs of active infection at this time. No fevers, chills, nausea, vomiting, or diarrhea. I did receive the paperwork from the physical therapist at the lymphedema clinic in [place]. Subsequently I signed off on that this morning and sent that back to him for further progression with the treatment plan. 07/28/2019 on evaluation today patient appears to be doing very well with regard to his right lower extremity where I do not see any open wounds at this point. Fortunately he is feeling great as far as that is concerned as well. In regard to the left lower extremity he has been having issues with still several areas of weeping and edema although the upper leg is doing better his lower leg still I think is going require the compression wrap at this time. No fevers, chills, nausea, vomiting, or diarrhea. 08/04/2019 on evaluation today patient unfortunately is having new wounds on the right lower extremity. Again we have been using Unna boot wrap on that side. We switched him to using his juxta lite wrap at home. With that being said he tells me he has been using it although his legs extremely swollen and to be honest really does not appear that he has been. I cannot know that for sure however. Nonetheless he has multiple new wounds on the right lower extremity at this time. Obviously we will have to see about getting this rewrapped for him today. 08/11/2019 on evaluation today patient appears to be doing fairly well with regard to his wounds. He has been tolerating the dressing changes including the compression wraps without complication. He still has a lot of edema in his upper thigh regions bilaterally he is supposed to be seeing the  lymphedema clinic on the 15th of this month once his wraps arrive for the upper part of his legs. 08/18/2019 on evaluation today patient appears to be doing well with regard to his bilateral lower extremities at this point. He has been tolerating the dressing changes without complication. Fortunately there is no signs of active infection which is also good news. He does have a couple weeping areas on the first and second toe of the right foot he also has just a small area on the left foot upper leg and a small area on the left lower leg but overall he is doing quite well in my opinion. He is supposed to be getting his wraps shortly in fact tomorrow and then subsequently is seeing the lymphedema clinic next Wednesday on the 21st. Of note he is also leaving on the 25th to go on vacation for a [place] to the beach. For that reason and since there is some uncertainty about what there can be doing at lymphedema clinic next Wednesday I am get a make an appointment for next Friday here for [place] to see what we need to do for him prior to him leaving for vacation. 10/23; patient arrives in considerable pain predominantly in the upper posterior calf just distal to the popliteal fossa also in the wound anteriorly above the major wound. This is probably cellulitis and he has had this recurrently in the past. He has no open wound on the right side and he has had an Radio broadcast assistant in that area. Finally I note that he has an area on the left posterior calf which by enlarge is mostly epithelialized. This protrudes beyond the borders of the surrounding skin in the setting of dry scaly skin and lymphedema. The patient is leaving for [facility] on Sunday. Per  his longstanding pattern, he will not take his compression pumps with him predominantly out of fear that they will be stolen. He therefore asked that we put a Unna boot back on the right leg. He will also contact the wound care center in [facility] to see if they can  change his dressing in the mid week. 11/3; patient returned from his vacation to [facility]. He was seen on 1 occasion at their wound care center. They did a 2 layer compression system as they did not have our 4-layer wrap. I am not completely certain what they put on the wounds. They did not change the Unna boot on the right. The patient is also seeing a lymphedema specialist physical therapist in [place]. It appears that he has some compression sleeve for his thighs which indeed look quite a bit better than I am used to seeing. He pumps over these with his external compression pumps. 11/10; the patient has a new wound on the right medial thigh otherwise there is no open areas on the right. He has an area on the left leg posteriorly anteriorly and medially and an area over the left second toe. We have been using silver alginate. He thinks the injury on his thigh is secondary to friction from the compression sleeve he has. 11/17; the patient has a new wound on the right medial thigh last week. He thinks this is because he did not have a underlying stocking for his thigh juxta lite apparatus. He now has this. The area is fairly large and somewhat angry but I do not think he has underlying cellulitis. He has a intact blister on the right anterior tibial area. Small wound on the right great toe dorsally Small area on the medial left calf. 11/30; the patient does not have any open areas on his right leg and we did not take his juxta lite stocking off. However he states that on Friday his compression wrap fell down lodging around his upper mid calf area. As usual this creates a lot of problems for him. He called urgently today to be seen for a nurse visit however the nurse visit turned into a provider visit because of extreme erythema and pain in the left anterior tibia extending laterally and posteriorly. The area that is problematic is extensive 10/06/2019 upon evaluation today patient actually  appears to be doing poorly in regard to his left lower extremity. He Dr. Leanord Hawking did place him on doxycycline this past Monday apparently due to the fact that he was doing much worse in regard to this left leg. Fortunately the doxycycline does seem to be helping. Unfortunately we are still having a very difficult time getting his edema under any type of control in order to anticipate discharge at some point. The only way were really able to control his lymphedema really is with compression wraps and that has only even seemingly temporary. He has been seeing a lymphedema clinic they are trying to help in this regard but still this has been somewhat frustrating in general for the patient. 10/13/19 on evaluation today patient appears to be doing excellent with regard to his right lower extremity as far as the wounds are concerned. His swelling is still quite extensive unfortunately. He is still having a lot of drainage from the thigh areas bilaterally which is unfortunate. He's been going to lymphedema clinic but again he still really does not have this edema under control as far as his lower extremities are concern. With regard  to his left lower extremity this seems to be improving and I do believe the doxycycline has been of benefit for him. He is about to complete the doxycycline. 10/20/2019 on evaluation today patient appears to be doing poorly in regard to his bilateral lower extremities. More in the right thigh he has a lot of irritation at this site unfortunately. In regard to the left lower extremity the wrap was not quite as high it appears and does seem to have caused him some trouble as well. Fortunately there is no evidence of systemic infection though he does have some blue-green drainage which has me concerned for the possibility of Pseudomonas. He tells me he is previously taking Cipro without complications and he really does not care for Levaquin however due to some of the side effects he  has. He is not allergic to any medications specifically antibiotics that were aware of. 10/27/2019 on evaluation today patient actually does appear to be for the most part doing better when compared to last week's evaluation. With that being said he still has multiple open wounds over the bilateral lower extremities. He actually forgot to start taking the Cipro and states that he still has the whole bottle. He does have several new blisters on left lower extremity today I think I would recommend he go ahead and take the Cipro based on what I am seeing at this point. 12/30-Patient comes at 1 week visit, 4 layer compression wraps on the left and Unna boot on the right, primary dressing Xtrasorb and silver alginate. Patient is taking his Cipro and has a few more days left probably 5-6, and the legs are doing better. He states he is using his compressions devices which I believe he has 11/10/2019 on evaluation today patient actually appears to be much better than last time I saw him 2 weeks ago. His wounds are significantly improved and overall I am very pleased in this regard. Fortunately there is no signs of active infection at this time. He is just a couple of days away from completing Cipro. Overall his edema is much better he has been using his lymphedema pumps which I think is also helping at this point. 11/17/2019 on evaluation today patient appears to be doing excellent in regard to his wounds in general. His legs are swollen but not nearly as much as they have been in the past. Fortunately he is tolerating the compression wraps without complication. No fevers, chills, nausea, vomiting, or diarrhea. He does have some erythema however in the distal portion of his right lower extremity specifically around the forefoot and toes there is a little bit of warmth here as well. 11/24/2019 on evaluation today patient appears to be doing well with regard to his right lower extremity I really do not see any open  wounds at this point. His left lower extremity does have several open areas and his right medial thigh also is open. Other than this however overall the patient seems to be making good progress and I am very pleased at this point. 12/01/2019 on evaluation today patient appears to be doing poorly at this point in regard to his left lower extremity has several new blisters despite the fact that we have him in compression wraps. In fact he had a 4-layer compression wrap, his upper thigh wrapped from lymphedema clinic, and a juxta light over top of the 4 layer compression wrap the lymphedema clinic applied and despite all this he still develop blisters underneath. Obviously this does  have me concerned about the fact that unfortunately despite what we are doing to try to get wounds healed he continues to have new areas arise I do not think he is ever good to be at the point where he can realistically just use wraps at home to keep things under control. Typically when we heal him it takes about 1-2 days before he is back in the clinic with severe breakdown and blistering of his lower extremities bilaterally. This is happened numerous times in the past. Unfortunately I think that we may need some help as far as overall fluid overload to kind of limit what we are seeing and get things under better control. 12/08/2019 on evaluation today patient presents for follow-up concerning his ongoing bilateral lower extremity edema. Unfortunately he is still having quite a bit of swelling the compression wraps are controlling this to some degree but he did see Dr. [name] his cardiologist I do have that available for review today as far as the appointment was concerned that was on 12/06/2019. Obviously that she has been 2 days ago. The patient states that he is only been taking the Lasix 80 mg 1 time a day he had told me previously he was taking this twice a day. Nonetheless Dr. [name] recommended this be up to 80 mg 2 times a  day for the patient as he did appear to be fluid overloaded. With that being said the patient states he did this yesterday and he was unable to go anywhere or do anything due to the fact that he was constantly having to urinate. Nonetheless I think that this is still good to be something that is important for him as far as trying to get his edema under control at all things that he is going to be able to just expect his wounds to get under control and things to be better without going through at least a period of time where he is trying to stabilize his fluid management in general and I think increasing the Lasix is likely the first step here. It was also mentioned the possibility that the patient may require metolazone. With that being said he wanted to have the patient take Lasix twice a day first and then reevaluating 2 months to see where things stand. 12/15/2019 upon evaluation today patient appears to be doing regard to his legs although his toes are showing some signs of weeping especially on the left at this point to some degree on the right. There does not appear to be any signs of active infection and overall I do feel like the compression wraps are doing well for him but he has not been able to take the Lasix at home and the increased dose that Dr. [name] recommended. He tells me that just not go to be feasible for him. Nonetheless I think in this case he should probably send a message to Dr. [name] in order to discuss options from the standpoint of possible admission to get the fluid off or otherwise going forward. 12/22/2019 upon evaluation today patient appears to be doing fairly well with regard to his lower extremities at this point. In fact he would be doing excellent if it was not for the fact that his right anterior thigh apparently had an allergic reaction to adhesive tape that he used. The wound itself that we have been monitoring actually appears to be healed. There is a lot of  irritation at this point. 12/29/2019 upon evaluation today patient appears to  be doing well in regard to his lower extremities. His left medial thigh is open and somewhat draining today but this is the only region that is open the right has done much better with the treatment utilizing the steroid cream that I prescribed for him last week. Overall I am pleased in that regard. Fortunately there is no signs of active infection at this time. No fevers, chills, nausea, vomiting, or diarrhea. 01/05/2020 upon evaluation today patient appears to be doing more poorly in regard to his right lower extremity at this point upon evaluation today. Unfortunately he continues to have issues in this regard and I think the biggest issue is controlling his edema. This obviously is not very well controlled at this point is been recommended that he use the Lasix twice a day but he has not been able to do that. Unfortunately I think this is leading to an issue where honestly he is not really able to effectively control his edema and therefore the wounds really are not doing significantly better. I do not think that he is going to be able to keep things under good control unless he is able to control his edema much better. I discussed this again in great detail with him today. 01/12/2020 good news is patient actually appears to be doing quite well today at this point. He does have an appointment with lymphedema clinic tomorrow. His legs appear healed and the toe on the left is almost completely healed. In general I am very pleased with how things stand at this point. 01/19/2020 upon evaluation today patient appears to actually be doing well in regard to his lower extremities there is nothing open at this point. Fortunately he has done extremely well more recently. Has been seeing lymphedema clinic as well. With that being said he has Velcro wraps for his lower legs as well as his upper legs. The only wound really is on his toe  which is the right great toe and this is barely anything even there. With all that being said I think it is good to be appropriate today to go ahead and switch him over to the Velcro compression wraps. 01/26/2020 upon evaluation today patient appears to be doing worse with regard to his lower extremities after last week switch him to Velcro compression wraps. Unfortunately he lasted less than 24 hours he did not have the sock portion of his Velcro wrap on the left leg and subsequently developed a blister underneath the Velcro portion. Obviously this is not good and not what we were looking for at this point. He states the lymphedema clinic did tell him to wear the wrap for 23 hours and take him off for 1 I am okay with that plan but again right now we got a get things back under control again he may have some cellulitis noted as well. 02/02/2020 upon evaluation today patient unfortunately appears to have several areas of blistering on his bilateral lower extremities today mainly on the feet. His legs do seem to be doing somewhat better which is good news. Fortunately there is no evidence of active infection at this time. No fevers, chills, nausea, vomiting, or diarrhea. 02/16/2020 upon evaluation today patient appears to be doing well at this time with regard to his legs. He has a couple weeping areas on his toes but for the most part everything is doing better and does appear to be sealed up on his legs which is excellent news. We can continue with wrapping him  at this point as he had every time we discontinue the wraps he just breaks out with new wounds. There is really no point in is going forward with this at this point. 03/08/2020 upon evaluation today patient actually appears to be doing quite well with regard to his lower extremity ulcers. He has just a very superficial and really almost nonexistent blister on the left lower extremity he has in general done very well with the compression wraps. With  that being said I do not see any signs of infection at this time which is good news. 03/29/2020 upon evaluation today patient appears to be doing well with regard to his wounds currently except for where he had several new areas that opened up due to some of the wrap slipping and causing him trouble. He states he did not realize they had slipped. Nonetheless he has a 1 area on the right and 3 new areas on the left. Fortunately there is no signs of active infection at this time which is great news. 04/05/2020 upon evaluation today patient actually appears to be doing quite well in general in regard to his legs currently. Fortunately there is no signs of active infection at this time. No fevers, chills, nausea, vomiting, or diarrhea. He tells me next week that he will actually be seen in the lymphedema clinic on Thursday at 10 AM I see him on Wednesday next week. 04/12/2020 upon evaluation today patient appears to be doing very well with regard to his lower extremities bilaterally. In fact he does not appear to have any open wounds at this point which is good news. Fortunately there is no signs of active infection at this time. No fevers, chills, nausea, vomiting, or diarrhea. 04/19/2020 upon evaluation today patient appears to be doing well with regard to his wounds currently on the bilateral lower extremities. There does not appear to be any signs of active infection at this time. Fortunately there is no evidence of systemic infection and overall very pleased at this point. Nonetheless after I held him out last week he literally had blisters the next morning already which swelled up with him being right back here in the clinic. Overall I think that he is just not can be able to be discharged with his legs the way they are he is much to volume overloaded as far as fluid is concerned and that was discussed with him today of also discussed this but should try the clinic nurse manager as well as Dr.  Leanord Hawking. 04/26/2020 upon evaluation today patient appears to be doing better with regard to his wounds currently. He is making some progress and overall swelling is under good control with the compression wraps. Fortunately there is no evidence of active infection at this time. 05/10/2020 on evaluation today patient appears to be doing overall well in regard to his lower extremities bilaterally. He is Tolerating the compression wraps without complication and with what we are seeing currently I feel like that he is making excellent progress. There is no signs of active infection at this time. 05/24/2020 upon evaluation today patient appears to be doing well in regard to his legs. The swelling is actually quite a bit down compared to where it has been in the past. Fortunately there is no sign of active infection at this time which is also good news. With that being said he does have several wounds on his toes that have opened up at this point. 05/31/2020 upon evaluation today patient appears to  be doing well with regard to his legs bilaterally where he really has no significant fluid buildup at this point overall he seems to be doing quite well. Very pleased in this regard. With regard to his toes these also seem to be drying up which is excellent. We have continue to wrap him as every time we tried as a transition to the juxta light wraps things just do not seem to get any better. 06/07/2020 upon evaluation today patient appears to be doing well with regard to his right leg at this point. Unfortunately left leg has a lot of blistering he tells me the wrap started to slide down on him when he tried to put his other Velcro wrap over top of it to help keep things in order but nonetheless still had some issues. 06/14/2020 on evaluation today patient appears to be doing well with regard to his lower extremity ulcers and foot ulcers at this point. I feel like everything is actually showing signs of improvement which  is great news overall there is no signs of active infection at this time. No fevers, chills, nausea, vomiting, or diarrhea. 06/21/2020 on evaluation today patient actually appears to be doing okay in regard to his wounds in general. With that being said the biggest issue I see is on his right foot in particular the first and second toe seem to be doing a little worse due to the fact this is staying very wet. I think he is probably getting need to change out his dressings a couple times in between each week when we see him in regard to his toes in order to keep this drier based on the location and how this is proceeding. 06/28/2020 on evaluation today patient appears to be doing a little bit more poorly overall in regard to the appearance of the skin I am actually somewhat concerned about the possibility of him having a little bit of an infection here. We discussed the course of potentially giving him a doxycycline prescription which he is taken previously with good result. With that being said I do believe that this is potentially mild and at this point easily fixed. I just do not want anything to get any worse. Electronic Signature(s) Signed: 06/28/2020 10:49:53 AM By: Lenda Kelp PA-C Entered By: Lenda Kelp on 06/28/2020 10:49:53 -------------------------------------------------------------------------------- Physical Exam Details Patient Name: Date of Service: [facility], Garey J. 06/28/2020 10:15 A M Medical Record Number: [id number] Patient Account Number: [id number] Date of Birth/Sex: Treating RN: 15-Dec-1950 (69 y.o. [name] Primary Care Provider: Marney Setting, [name] Other Clinician: Referring Provider: Treating Provider/Extender: Lenda Kelp MCGO [name], [name] in Treatment: 231 Constitutional Well-nourished and well-hydrated in no acute distress. Respiratory normal breathing without difficulty. Psychiatric this patient is able to make decisions and demonstrates  good insight into disease process. Alert and Oriented x 3. pleasant and cooperative. Notes Upon inspection patient's wound bed actually showed signs of good granulation at this time. There does not appear to be any signs of active systemic infection currently. No fevers, chills, nausea, vomiting, or diarrhea. With that being said I do feel like he has some local infection as far as her legs are concerned he has some small pustules that does have me worried in this regard. He has previously responded extremely well to doxycycline I would recommend that currently. Electronic Signature(s) Signed: 06/28/2020 10:51:25 AM By: Lenda Kelp PA-C Entered By: Lenda Kelp on 06/28/2020 10:51:25 -------------------------------------------------------------------------------- Physician Orders Details Patient  Name: Date of Service: Kevin MILTON, [place] 06/28/2020 10:15 A M Medical Record Number: [id number] Patient Account Number: [id number] Date of Birth/Sex: Treating RN: 1951/05/09 (69 y.o. 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 Dressing Change Frequency Wound #178 Right T Second oe Change Dressing every other day. Wound #180 Left,Proximal,Dorsal Foot Do not change  entire dressing for one week. - both legs Skin Barriers/Peri-Wound Care Moisturizing lotion - both legs and feet and toes Wound Cleansing May shower with protection. Primary Wound Dressing Wound #178 Right T Second oe Calcium Alginate with Silver Wound #180 Left,Proximal,Dorsal Foot Calcium Alginate with Silver Secondary Dressing Wound #178 Right T Second oe Kerlix/Rolled Gauze Dry Gauze Wound #180 Left,Proximal,Dorsal Foot Dry Gauze Edema Control 4 layer compression: Left lower extremity Unna Boot to Right Lower Extremity Avoid standing for long periods of time Elevate legs to the level of the heart or above for 30 minutes daily and/or when sitting, a frequency of: - throughout the day Exercise regularly Patient Medications llergies: No Known Allergies A Notifications Medication Indication Start End 06/28/2020 doxycycline hyclate DOSE 1 - oral 100 mg capsule - 1 capsule oral taken 2 times per day for 14 days Electronic Signature(s) Signed: 06/28/2020 10:59:10 AM By: Lenda Kelp PA-C Entered By: Lenda Kelp on 06/28/2020 10:59:03 -------------------------------------------------------------------------------- Problem List Details Patient Name: Date of Service: Kevin [name], Sehaj J. 06/28/2020 10:15 A M Medical Record Number: [id number] Patient Account Number: [id number] Date of Birth/Sex: Treating RN: 1951/02/27 (69 y.o. [name] Primary Care Provider: Marney Setting, [name] Other Clinician: Referring Provider: Treating Provider/Extender: Suzy Bouchard, [name] in Treatment: 231 Active Problems ICD-10 Encounter Code Description Active Date MDM Diagnosis 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 skin 06/30/2018 No Yes L97.221 Non-pressure chronic ulcer of left calf limited to breakdown of skin 09/30/2016 No Yes 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 of 01/22/2016 No  Yes bilateral lower extremity I89.0 Lymphedema, not elsewhere classified 01/22/2016 No Yes E11.622 Type 2 diabetes mellitus with other skin ulcer 01/22/2016 No Yes 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, unspecified 01/22/2016 No Yes L03.116 Cellulitis of left lower limb 04/01/2017 No Yes Inactive Problems ICD-10 Code Description Active Date Inactive Date 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 skin 06/30/2017 06/30/2017 L97.521 Non-pressure chronic ulcer of other 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 of skin 04/27/2018 04/27/2018 L03.115 Cellulitis of right lower limb 12/22/2017 12/22/2017 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 06/30/2018 06/30/2018 L97.511 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 of skin 06/30/2018 06/30/2018 Resolved Problems Electronic Signature(s) Signed: 06/28/2020 10:40:41 AM By: Lenda Kelp PA-C Entered By: Lenda Kelp on 06/28/2020 10:40:39 -------------------------------------------------------------------------------- Progress Note Details Patient Name: Date of Service: Kevin [name], Leontae J. 06/28/2020 10:15 A M Medical Record Number: [id number]  Patient Account Number: [id number] Date of Birth/Sex: Treating RN: 1951/07/09 (69 y.o. [name] Primary Care Provider: Marney Setting, [name] Other Clinician: Referring Provider: Treating Provider/Extender: Lenda Kelp MCGO [name], [name] in Treatment: 231 Subjective Chief Complaint Information obtained from Patient patient is here for evaluation venous/lymphedema weeping History of Present Illness (HPI) Referred by PCP for consultation. Patient has long standing history of BLE venous stasis, no prior ulcerations. At beginning of month, developed cellulitis and weeping. Received IM Rocephin followed by Keflex and resolved. Wears compression stocking, appr 6 months old. Not sure strength. No  present drainage. 01/22/16 this is a patient who is a type II diabetic on insulin. 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 He tells me he religiously wears pressure stockings of uncertain strength. He was here with weeping edema about 8 months ago but did not have an open wound. Roughly a month ago he had a reopening on his bilateral legs. He is been using bandages and Neosporin. He does not complain of pain. 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 Last BUN/creatinine I have is from 11/20/15 at 13 and 1.0 respectively 01/29/16; patient arrives today having tolerated the Profore wrap. He brought in his stockings and these are 18 mmHg stockings he bought from [place]. The compression here is likely inadequate. He does not complain of pain or excessive drainage she has no systemic symptoms. The wound on the right looks improved as does the one on the left although one on the left is more substantial with still tissue at risk below the actual wound area on the bilateral posterior calf 02/05/16; patient arrives with poor edema control. He states that we did put a 4 layer compression on it last week. No weight appear 5 this. 02/12/16; the area on the posterior right Has healed. The left Has a substantial wound that has necrotic surface eschar that requires a debridement with a curette. 02/16/16;the patient called or a Nurse visit secondary to increased swelling. He had been in earlier in the week with his right leg healed. He was transitioned to is on pressure stocking on the right leg with the only open wound on the left, a substantial area on the left posterior calf. Note he has a history of severe lower extremity edema, he has a history of chronic atrial fibrillation but not heart failure per my notes but I'll need to research this. He is not complaining of  chest pain shortness of breath or orthopnea. The intake nurse noted blisters on the previously closed right leg 02/19/16; this is the patient's regular visit day. I see him on Friday with escalating edema new wounds on the right leg and clear signs of at least right ventricular heart failure. I increased his Lasix to 40 twice a day. He is returning currently in follow-up. States he is noticed a decrease in that the edema 02/26/16 patient's legs have much less edema. There is nothing really open on the right leg. The left leg has improved condition of the large superficial wound on the posterior left leg 03/04/16; edema control is very much better. The patient's right leg wounds have healed. On the left leg he continues to have severe venous inflammation on the posterior aspect of the left leg. There is no tenderness and I don't think any of this is cellulitis. 03/11/16; patient's right leg is married  healed and he is in his own stocking. The patient's left leg has deteriorated somewhat. There is a lot of erythema around the wound on the posterior left leg. There is also a significant rim of erythema posteriorly just above where the wrap would've ended there is a new wound in this location and a lot of tenderness. Can't rule out cellulitis in this area. 03/15/16; patient's right leg remains healed and he is in his own stocking. The patient's left leg is much better than last review. His major wound on the posterior aspect of his left Is almost fully epithelialized. He has 3 small injuries from the wraps. Really. Erythema seems a lot better on antibiotics 03/18/16; right leg remains healed and he is in his own stocking. The patient's left leg is much better. The area on the posterior aspect of the left calf is fully epithelialized. His 3 small injuries which were wrap injuries on the left are improved only one seems still open his erythema has resolved 03/25/16; patient's right leg remains healed and he is in  his own stocking. There is no open area today on the left leg posterior leg is completely closed up. His wrap injuries at the superior aspect of his leg are also resolved. He looks as though he has some irritation on the dorsal ankle but this is fully epithelialized without evidence of infection. 03/28/16; we discharged this patient on Monday. Transitioned him into his own stocking. There were problems almost immediately with uncontrolled swelling weeping edema multiple some of which have opened. He does not feel systemically unwell in particular no chest pain no shortness of breath and he does not feel 04/08/16; the edema is under better control with the Profore light wrap but he still has pitting edema. There is one large wound anteriorly 2 on the medial aspect of his left leg and 3 small areas on the superior posterior calf. Drainage is not excessive he is tolerating a Profore light well 04/15/16; put a Profore wrap on him last week. This is controlled is edema however he had a lot of pain on his left anterior foot most of his wounds are healed 04/22/16 once again the patient has denuded areas on the left anterior foot which he states are because his wrap slips up word. He saw his primary physician today is on Lasix 40 twice a day and states that he his weight is down 20 pounds over the last 3 months. 04/29/16: Much improved. left anterior foot much improved. He is now on Lasix 80 mg per day. Much improved edema control 05/06/16; I was hoping to be able to discharge him today however once again he has blisters at a low level of where the compression was placed last week mostly on his left lateral but also his left medial leg and a small area on the anterior part of the left foot. 05/09/16; apparently the patient went home after his appointment on 7/4 later in the evening developing pain in his upper medial thigh together with subjective fever and chills although his temperature was not taken. The pain was so  intense he felt he would probably have to call 911. However he then remembered that he had leftover doxycycline from a previous round of antibiotics and took these. By the next morning he felt a lot better. He called and spoke to one of our nurses and I approved doxycycline over the phone thinking that this was in relation to the wounds we had previously seen although  they were definitely were not. The patient feels a lot better old fever no chills he is still working. Blood sugars are reasonably controlled 05/13/16; patient is back in for review of his cellulitis on his anterior medial upper thigh. He is taking doxycycline this is a lot better. Culture I did of the nodular area on the dorsal aspect of his foot grew MRSA this also looks a lot better. 05/20/16; the patient is cellulitis on the medial upper thigh has resolved. All of his wound areas including the left anterior foot, areas on the medial aspect of the left calf and the lateral aspect of the calf at all resolved. He has a new blister on the left dorsal foot at the level of the fourth toe this was excised. No evidence of infection 05/27/16; patient continues to complain weeping edema. He has new blisterlike wounds on the left anterior lateral and posterior lateral calf at the top of his wrap levels. The area on his left anterior foot appears better. He is not complaining of fever, pain or pruritus in his feet. 05/30/16; the patient's blisters on his left anterior leg posterior calf all look improved. He did not increase the Lasix 100 mg as I suggested because he was going to run out of his 40 mg tablets. He is still having weeping edema of his toes 06/03/16; I renewed his Lasix at 80 mg once a day as he was about to run out when I last saw him. He is on 80 mg of Lasix now. I have asked him to cut down on the excessive amount of water he was drinking and asked him to drink according to his thirst mechanisms 06/12/2016 -- was seen 2 days ago and  was supposed to wear his compression stockings at home but he is developed lymphedema and superficial blisters on the left lower extremity and hence came in for a review 06/24/16; the remaining wound is on his left anterior leg. He still has edema coming from between his toes. There is lymphedema here however his edema is generally better than when I last saw this. He has a history of atrial fibrillation but does not have a known history of congestive heart failure nevertheless I think he probably has this at least on a diastolic basis. 07/01/16 I reviewed his echocardiogram from January 2017. This was essentially normal. He did not have LVH, EF of 55-60%. His right ventricular function was normal although he did have trivial tricuspid and pulmonic regurgitation. This is not audible on exam however. I increased his Lasix to do massive edema in his legs well above his knees I think in early July. He was also drinking an excessive amount of water at the time. 07/15/16; missed his appointment last week because of the Labor Day holiday on Monday. He could not get another appointment later in the week. Started to feel the wrap digging in superiorly so we remove the top half and the bottom half of his wrap. He has extensive erythema and blistering superiorly in the left leg. Very tender. Very swollen. Edema in his foot with leaking edema fluid. He has not been systemically unwell 07/22/16; the area on the left leg laterally required some debridement. The medial wounds look more stable. His wrap injury wounds appear to have healed. Edema and his foot is better, weeping edema is also better. He tells me he is meeting with the supplier of the external compression pumps at work 08/05/16; the patient was on vacation last week  in [place]. His wrap is been on for an extended period of time. Also over the weekend he developed an extensive area of tender erythema across his anterior medial thigh. He took to  doxycycline yesterday that he had leftover from a previous prescription. The patient complains of weeping edema coming out of his toes 08/08/16; I saw this patient on 10/2. He was tender across his anterior thigh. I put him on doxycycline. He returns today in follow-up. He does not have any open wounds on his lower leg, he still has edema weeping into his toes. 08/12/16; patient was seen back urgently today to follow-up for his extensive left thigh cellulitis/erysipelas. He comes back with a lot less swelling and erythema pain is much better. I believe I gave him Augmentin and Cipro. His wrap was cut down as he stated a roll down his legs. He developed blistering above the level of the wrap that remained. He has 2 open blisters and 1 intact. 08/19/16; patient is been doing his primary doctor who is increased his Lasix from 40-80 once a day or 80 already has less edema. Cellulitis has remained improved in the left thigh. 2 open areas on the posterior left calf 08/26/16; he returns today having new open blisters on the anterior part of his left leg. He has his compression pumps but is not yet been shown how to use some vital representative from the supplier. 09/02/16 patient returns today with no open wounds on the left leg. Some maceration in his plantar toes 09/10/2016 -- Dr. Leanord Hawking had recently discharged him on 09/02/2016 and he has come right back with redness swelling and some open ulcers on his left lower extremity. He says this was caused by trying to apply his compression stockings and he's been unable to use this and has not been able to use his lymphedema pumps. He had some doxycycline leftover and he has started on this a few days ago. 09/16/16; there are no open wounds on his leg on the left and no evidence of cellulitis. 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 He does not complain of symptoms here. I am not clear use using his  external compression pumps. 09/23/16; I have not seen this patient in 2 weeks. He canceled his appointment 10 days ago as he was going on vacation. He tells me that on Monday he noticed a large area on his posterior left leg which is been draining copiously and is reopened into a large wound. He is been using ABDs and the external part of his juxtalite, according to our nurse this was not on properly. 10/07/16; Still a substantial area on the posterior left leg. Using silver alginate 10/14/16; in general better although there is still open area which looks healthy. Still using silver alginate. He reminds me that this happen before he left for [facility] T oday while he was showering in the morning. He had been using his juxtalite's 10/21/16; the area on his posterior left leg is fully epithelialized. However he arrives today with a large area of tender erythema in his medial and posterior left thigh just above the knee. I have marked the area. Once again he is reluctant to consider hospitalization. I treated him with oral antibiotics in the past for a similar situation with resolution I think with doxycycline however this area it seems more extensive to me. He is not complaining of fever but does have chills and says  states he is thirsty. His blood sugar today was in the 140s at home 10/25/16 the area on his posterior left leg is fully epithelialized although there is still some weeping edema. The large area of tenderness and erythema in his medial and posterior left thigh is a lot less tender although there is still a lot of swelling in this thigh. He states he feels a lot better. He is on doxycycline and Augmentin that I started last week. This will continued until Tuesday, December 26. I have ordered a duplex ultrasound of the left thigh rule out DVT whether there is an abscess something that would need to be drained I would also like to know. 11/01/16; he still has weeping edema from a not fully  epithelialized area on his left posterior calf. Most of the rest of this looks a lot better. He has completed his antibiotics. His thigh is a lot better. Duplex ultrasound did not show a DVT in the thigh 11/08/16; he comes in today with more Denuded surface epithelium from the posterior aspect of his calf. There is no real evidence of cellulitis. The superior aspect of his wrap appears to have put quite an indentation in his leg just below the knee and this may have contributed. He does not complain of pain or fever. We have been using silver alginate as the primary dressing. The area of cellulitis in the right thigh has totally resolved. He has been using his compression stockings once a week 11/15/16; the patient arrives today with more loss of epithelium from the posterior aspect of his left calf. He now has a fairly substantial wound in this area. The reason behind this deterioration isn't exactly clear although his edema is not well controlled. He states he feels he is generally more swollen systemically. He is not complaining of chest pain shortness of breath fever. T me he has an appointment with his primary physician in early February. He is on 80 mg of oral ells Lasix a day. He claims compliance with the external compression pumps. He is not having any pain in his legs similar to what he has with his recurrent cellulitis 11/22/16; the patient arrives a follow-up of his large area on his left lateral calf. This looks somewhat better today. He came in earlier in the week for a dressing change since I saw him a week ago. He is not complaining of any pain no shortness of breath no chest pain 11/28/16; the patient arrives for follow-up of his large area on the left lateral calf this does not look better. In fact it is larger weeping edema. The surface of the wound does not look too bad. We have been using silver alginate although I'm not certain that this is a dressing issue. 12/05/16; again the patient  follows up for a large wound on the left lateral and left posterior calf this does not look better. There continues to be weeping edema necrotic surface tissue. More worrisome than this once again there is erythema below the wound involving the distal Achilles and heel suggestive of cellulitis. He is on his feet working most of the day of this is not going well. We are changing his dressing twice a week to facilitate the drainage. 12/12/16; not much change in the overall dimensions of the large area on the left posterior calf. This is very inflamed looking. I gave him an. Doxycycline last week does not really seem to have helped. He found the wrap very painful indeed it  seems to of dog into his legs superiorly and perhaps around the heel. He came in early today because the drainage had soaked through his dressings. 12/19/16- patient arrives for follow-up evaluation of his left lower extremity ulcers. He states that he is using his lymphedema pumps once daily when there is "no drainage". He admits to not using his lipedema pumps while under current treatment. His blood sugars have been consistently between 150-200. 12/26/16; the patient is not using his compression pumps at home because of the wetness on his feet. I've advised him that I think it's important for him to use this daily. He finds his feet too wet, he can put a plastic bag over his legs while he is in the pumps. Otherwise I think will be in a vicious circle. We are using silver alginate to the major area on his left posterior calf 01/02/17; the patient's posterior left leg has further of all into 3 open wounds. All of them covered with a necrotic surface. He claims to be using his compression pumps once a day. His edema control is marginal. Continue with silver alginate 01/10/17; the patient's left posterior leg actually looks somewhat better. There is less edema, less erythema. Still has 3 open areas covered with a necrotic surface requiring  debridement. He claims to be using his compression pumps once a day his edema control is better 01/17/17; the patient's left posterior calf look better last week when I saw him and his wrap was changed 2 days ago. He has noted increasing pain in the left heel and arrives today with much larger wounds extensive erythema extending down into the entire heel area especially tender medially. He is not systemically unwell CBGs have been controlled no fever. Our intake nurse showed me limegreen drainage on his AVD pads. 01/24/17; his usual this patient responds nicely to antibiotics last week giving him Levaquin for presumed Pseudomonas. The whole entire posterior part of his leg is much better much less inflamed and in the case of his Achilles heel area much less tender. He has also had some epithelialization posteriorly there are still open areas here and still draining but overall considerably better 01/31/17- He has continue to tolerate the compression wraps. he states that he continues to use the lymphedema pumps daily, and can increase to twice daily on the weekends. He is voicing no complaints or concerns regarding his LLE ulcers 02/07/17-he is here for follow-up evaluation. He states that he noted some erythema to the left medial and anterior thigh, which he states is new as of yesterday. He is concerned about recurrent cellulitis. He states his blood sugars have been slightly elevated, this morning in the 180s 02/14/17; he is here for follow-up evaluation. When he was last here there was erythema superiorly from his posterior wound in his anterior thigh. He was prescribed Levaquin however a culture of the wound surface grew MRSA over the phone I changed him to doxycycline on Monday and things seem to be a lot better. 02/24/17; patient missed his appointment on Friday therefore we changed his nurse visit into a physician visit today. Still using silver alginate on the large area of the posterior left thigh.  He isn't new area on the dorsal left second toe 03/03/17; actually better today although he admits he has not used his external compression pumps in the last 2 days or so because of work responsibilities over the weekend. 03/10/17; continued improvement. External compression pumps once a day almost all of his wounds  have closed on the posterior left calf. Better edema control 03/17/17; in general improved. He still has 3 small open areas on the lateral aspect of his left leg however most of the area on the posterior part of his leg is epithelialized. He has better edema control. He has an ABD pad under his stocking on the right anterior lower leg although he did not let us look at that today. 03/24/17; patient arrives back in clinic today with no open areas however there are areas on the posterior left calf and anterior left calf that are less than 100% epithelialized. His edema is well controlled in the left lower leg. There is some pitting edema probably lymphedema in the left upper thigh. He uses compression pumps at home once per day. I tried to get him to do this twice a day although he is very reticent. 04/01/2017 -- for the last 2 days he's had significant redness, tenderness and weeping and came in for an urgent visit today. 04/07/17; patient still has 6 more days of doxycycline. He was seen by Dr. Meyer Russel last Wednesday for cellulitis involving the posterior aspect, lateral aspect of his Involving his heel. For the most part he is better there is less erythema and less weeping. He has been on his feet for 12 hours o2 over the weekend. Using his compression pumps once a day 04/14/17 arrives today with continued improvement. Only one area on the posterior left calf that is not fully epithelialized. He has intense bilateral venous inflammation associated with his chronic venous insufficiency disease and secondary lymphedema. We have been using silver alginate to the left posterior calf wound In passing  he tells [place] today that the right leg but we have not seen in quite some time has an open area on it but he doesn't want [place] to look at this today states he will show this to [place] next week. 04/21/17; there is no open area on his left leg although he still reports some weeping edema. He showed [place] his right leg today which is the first time we've seen this leg in a long time. He has a large area of open wound on the right leg anteriorly healthy granulation. Quite a bit of swelling in the right leg and some degree of venous inflammation. He told us about the right leg in passing last week but states that deterioration in the right leg really only happened over the weekend 04/28/17; there is no open area on the left leg although there is an irritated part on the posterior which is like a wrap injury. The wound on the right leg which was new from last week at least to [place] is a lot better. 05/05/17; still no open area on the left leg. Patient is using his new compression stocking which seems to be doing a good job of controlling the edema. He states he is using his compression pumps once per day. The right leg still has an open wound although it is better in terms of surface area. Required debridement. A lot of pain in the posterior right Achilles marked tenderness. Usually this type of presentation this patient gives concern for an active cellulitis 05/12/17; patient arrives today with his major wound from last week on the right lateral leg somewhat better. Still requiring debridement. He was using his compression stocking on the left leg however that is reopened with superficial wounds anteriorly he did not have an open wound on this leg previously. He is still using  his juxta light's once daily at night. He cannot find the time to do this in the morning as he has to be at work by 7 AM 05/19/17; right lateral leg wound looks improved. No debridement required. The concerning area is on the left posterior leg which  appears to almost have a subcutaneous hemorrhagic component to it. We've been using silver alginate to all the wounds 05/26/17; the right lateral leg wound continues to look improved. However the area on the left posterior calf is a tightly adherent surface. [place] using silver alginate. Because of the weeping edema in his legs there is very little good alternatives. 06/02/17; the patient left here last week looking quite good. Major wound on the left posterior calf and a small one on the right lateral calf. Both of these look satisfactory. He tells me that by Wednesday he had noted increased pain in the left leg and drainage. He called on Thursday and Friday to get an appointment here but we were blocked. He did not go to urgent care or his primary physician. He thinks he had a fever on Thursday but did not actually take his temperature. He has not been using his compression pumps on the left leg because of pain. I advised him to go to the emergency room today for IV antibiotics for stents of left leg cellulitis but he has refused I have asked him to take 2 days off work to keep his leg elevated and he has refused this as well. In view of this I'm going to call him and Augmentin and doxycycline. 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 However after a detailed discussion regarding the risk benefits and alternatives he refuses to get admitted to the hospital. With no other recourse I will continue him on oral antibiotics as before and hopefully he'll have his infectious disease consultation this week. 06/16/2017 -- the patient was seen today by the nurse practitioner at infectious disease Ms. Dixon. Her review noted recurrent cellulitis of the lower  extremity with tinea pedis of the left foot and she has recommended clindamycin 150 mg daily for now and she may increase it to 300 mg daily to cover staph and Streptococcus. He has also been advise Lotrimin cream locally. she also had wise IV antibiotics for his condition if it flares up 06/23/17; patient arrives today with drainage bilaterally although the remaining wound on the left posterior calf after cleaning up today "highlighter yellow drainage" did not look too bad. Unfortunately he has had breakdown on the right anterior leg [previously this leg had not been open and he is using a black stocking] he went to see infectious disease and is been put on clindamycin 150 mg daily, I did not verify the dose although I'm not familiar with using clindamycin in this dosing range, perhaps for prophylaxisoo 06/27/17; I brought this patient back today to follow-up on the wound deterioration on the right lower leg together with surrounding cellulitis. I started him on doxycycline 4 days ago. This area looks better however he comes in today with intense cellulitis on the medial part of his left thigh. This is not have a wound in this area. Extremely tender. We've been using silver alginate to the wounds on the right  lower leg left lower leg with bilateral 4 layer compression he is using his external compression pumps once a day 07/04/17; patient's left medial thigh cellulitis looks better. He has not been using his compression pumps as his insert said it was contraindicated with cellulitis. His right leg continues to make improvements all the wounds are still open. We only have one remaining wound on the left posterior calf. Using silver alginate to all open areas. He is on doxycycline which I started a week ago and should be finishing I gave him Augmentin after Thursday's visit for the severe cellulitis on the left medial thigh which fortunately looks better 07/14/17; the patient's left medial thigh cellulitis  has resolved. The cellulitis in his right lower calf on the right also looks better. All of his wounds are stable to improved we've been using silver alginate he has completed the antibiotics I have given him. He has clindamycin 150 mg once a day prescribed by infectious disease for prophylaxis, I've advised him to start this now. We have been using bilateral Unna boots over silver alginate to the wound areas 07/21/17; the patient is been to see infectious disease who noted his recurrent problems with cellulitis. He was not able to tolerate prophylactic clindamycin therefore he is on amoxicillin 500 twice a day. He also had a second daily dose of Lasix added By Dr. Oneta Rack but he is not taking this. Nor is he being completely compliant with his compression pumps a especially not this week. He has 2 remaining wounds one on the right posterior lateral lower leg and one on the left posterior medial lower leg. 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 The patient has Unna boots bilaterally. 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 He has bilateral Unna boots for compression and silver alginate to his wounds. Arrives today with his legs looking as good as I have seen him in quite some time. Not surprisingly his wounds look better as well with improvement on the right lateral leg venous insufficiency wound and also the left medial leg. He is still using the compression pumps once a day 08/11/17; both legs appear to be doing better wounds on the right lateral and left medial legs look better. Skin on the right leg quite good. He is been using silver alginate as the primary dressing. I'm going to use Anasept gel calcium alginate and maintain all the secondary dressings 08/18/17; the  patient continues to actually do quite well. The area on his right lateral leg is just about closed the left medial also looks better although it is still moist in this area. His edema is well controlled we have been using Anasept gel with calcium alginate and the usual secondary dressings, 4 layer compression and once daily use of his compression pumps "always been able to manage 09/01/17; the patient continues to do reasonably well in spite of his trip to [place]. The area on the right lateral leg is epithelialized. Left is much better but still open. He has more edema and more chronic erythema on the left leg [venous inflammation] 09/08/17; he arrives today with no open wound on the right lateral leg and decently controlled edema. Unfortunately his left leg is not nearly as in his good situation as last week.he apparently had  increasing edema starting on Saturday. He edema soaked through into his foot so used a plastic bag to walk around his home. The area on the medial right leg which was his open area is about the same however he has lost surface epithelium on the left lateral which is new and he has significant pain in the Achilles area of the left foot. He is already on amoxicillin chronically for prophylaxis of cellulitis in the left leg 09/15/17; he is completed a week of doxycycline and the cellulitis in the left posterior leg and Achilles area is as usual improved. He still has a lot of edema and fluid soaking through his dressings. There is no open wound on the right leg. He saw infectious disease NP today 09/22/17;As usual 1 we transition him from our compression wraps to his stockings things did not go well. He has several small open areas on the right leg. He states this was caused by the compression wrap on his skin although he did not wear this with the stockings over them. He has several superficial areas on the left leg medially laterally posteriorly. He does not have any evidence  of active cellulitis especially involving the left Achilles The patient is traveling from [facility] Saturday going to [facility]. He states he isn't attempting to get an appointment with a [facility] objects wound center there to change his dressings. I am not completely certain whether this will work 10/06/17; the patient came in on Friday for a nurse visit and the nurse reported that his legs actually look quite good. He arrives in clinic today for his regular follow-up visit. He has a new wound on his left third toe over the PIP probably caused by friction with his footwear. He has small areas on the left leg and a very superficial but epithelialized area on the right anterior lateral lower leg. Other than that his legs look as good as I've seen him in quite some time. We have been using silver alginate Review of systems; no chest pain no shortness of breath other than this a 10 point review of systems negative 10/20/17; seen by Dr. Meyer Russel last week. He had taken some antibiotics [doxycycline] that he had left over. Dr. Meyer Russel thought he had candida infection and declined to give him further antibiotics. He has a small wound remaining on the right lateral leg several areas on the left leg including a larger area on the left posterior several left medial and anterior and a small wound on the left lateral. The area on the left dorsal third toe looks a lot better. ROS; Gen.; no fever, respiratory no cough no sputum Cardiac no chest pain other than this 10 point review of system is negative 10/30/17; patient arrives today having fallen in the bathtub 3 days ago. It took him a while to get up. He has pain and maceration in the wounds on his left leg which have deteriorated. He has not been using his pumps he also has some maceration on the right lateral leg. 11/03/17; patient continues to have weeping edema especially in the left leg. This saturates his dressings which were just put on on 12/27. As usual  the doxycycline seems to take care of the cellulitis on his lower leg. He is not complaining of fever, chills, or other systemic symptoms. He states his leg feels a lot better on the doxycycline I gave him empirically. He also apparently gets injections at his primary doctor's officeo Rocephin for cellulitis prophylaxis. I  didn't ask him about his compression pump compliance today I think that's probably marginal. Arrives in the clinic with all of his dressings primary and secondary macerated full of fluid and he has bilateral edema 11/10/17; the patient's right leg looks some better although there is still a cluster of wounds on the right lateral. The left leg is inflamed with almost circumferential skin loss medially to laterally although we are still maintaining anteriorly. He does not have overt cellulitis there is a lot of drainage. He is not using compression pumps. We have been using silver alginate to the wound areas, there are not a lot of options here 11/17/17; the patient's right leg continues to be stable although there is still open wounds, better than last week. The inflammation in the left leg is better. Still loss of surface layer epithelium especially posteriorly. There is no overt cellulitis in the amount of edema and his left leg is really quite good, tells me he is using his compression pumps once a day. 11/24/17; patient's right leg has a small superficial wound laterally this continues to improve. The inflammation in the left leg is still improving however we have continuous surface layer epithelial loss posteriorly. There is no overt cellulitis in the amount of edema in both legs is really quite good. He states he is using his compression pumps on the left leg once a day for 5 out of 7 days 12/01/17; very small superficial areas on the right lateral leg continue to improve. Edema control in both legs is better today. He has continued loss of surface epithelialization and left  posterior calf although I think this is better. We have been using silver alginate with large number of absorptive secondary dressings 4 layer on the left Unna boot on the right at his request. He tells me he is using his compression pumps once a day 12/08/17; he has no open area on the right leg is edema control is good here. ooOn the left leg however he has marked erythema and tenderness breakdown of skin. He has what appears to be a wrap injury just distal to the popliteal fossa. 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 There was swelling erythema and breakdown in the right leg. He has much worse skin breakdown in the left leg as well multiple open areas medially and posteriorly as well as laterally. He tells me he has been using his compression pumps but tells me he feels that the drainage out of his leg is worse when he uses a compression pumps. T be fair to him he is been saying this o for a while however I don't know that I have really been listening to this. I wonder if the compression pumps are working properly 12/22/17;. Once again he arrives with severe erythema, weeping edema from the left greater than right leg. Noncompliance with compression pumps. New this visit he is complaining of pain on the lateral aspect of the right leg and the medial aspect of his right thigh. He apparently saw his cardiologist Dr. [name] who was ordered an echocardiogram area and I think this is a step in the right direction 12/25/17; started his doxycycline Monday night. There is still intense erythema of the right  leg especially in the anterior thigh although there is less tenderness. The erythema around the wound on the right lateral  calf also is less tender. He still complaining of pain in the left heel. His wounds are about the same right lateral left medial left lateral. Superficial but certainly not close to closure. He denies being systemically unwell no fever chills no abdominal pain no diarrhea 12/29/17; back in follow-up of his extensive right calf and right thigh cellulitis. I added amoxicillin to cover possible doxycycline resistant strep. This seems to of done the trick he is in much less pain there is much less erythema and swelling. He has his echocardiogram at 11:00 this morning. X-ray of the left heel was also negative. 01/05/18; the patient arrived with his edema under much better control. Now that he is retired he is able to use his compression pumps daily and sometimes twice a day per the patient. He has a wound on the right leg the lateral wound looks better. Area on the left leg also looks a lot better. He has no evidence of cellulitis in his bilateral thighs I had a quick peak at his echocardiogram. He is in normal ejection fraction and normal left ventricular function. He has moderate pulmonary hypertension moderately reduced right ventricular function. One would have to wonder about chronic sleep apnea although he says he doesn't snore. He'll review the echocardiogram with his cardiologist. 01/12/18; the patient arrives with the edema in both legs under exemplary control. He is using his compression pumps daily and sometimes twice daily. His wound on the right lateral leg is just about closed. He still has some weeping areas on the posterior left calf and lateral left calf although everything is just about closed here as well. I have spoken with Aldean Baker who is the patient's nurse practitioner and infectious disease. She was concerned that the patient had not understood that the parenteral penicillin injections he was receiving for cellulitis prophylaxis was actually benefiting him. I don't think the  patient actually saw that I would tend to agree we were certainly dealing with less infections although he had a serious one last month. 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 He is healed. He'll be placed in juxtalite compression wraps and increase his lymphedema pumps to twice daily. We will follow up again next week to ensure there are no issues with the new regiment. 01/20/18-he is here for evaluation of bilateral lower extremity weeping edema. Yesterday he was placed in compression wrap to the right lower extremity and compression stocking to left lower shrubbery. He states he uses lymphedema pumps last night and again this morning and noted a blister to the left lower extremity. On exam he was noted to have drainage to the right lower extremity. He will be placed in Unna boots bilaterally and follow-up next week 01/26/18; patient was actually discharged a week ago to his own juxta light stockings only to return the next day with bilateral lower extremity weeping edema.he was placed in bilateral Unna boots. He arrives today with pain in the back of his left leg. There is no open area on the right leg however there is a linear/wrap injury on the left leg and weeping edema on the left leg posteriorly. I spoke with infectious disease about 10 days ago. They were disappointed that the patient elected to discontinue prophylactic intramuscular penicillin shots as they felt it was particularly beneficial in reducing the frequency of his cellulitis. I  discussed this with the patient today. He does not share this view. He'll definitely need antibiotics today. Finally he is traveling to [place] leaving this Saturday and returning a week later and he does not travel with his pumps. He is going by car 01/30/18; patient was seen 4 days ago and brought back in today for review of cellulitis in the left leg posteriorly. I put him on amoxicillin this really  hasn't helped as much as I might like. He is also worried because [facility] [place] [facility] [place]. Finally we will be rewrapping him. There is no open area on the right leg over his left leg has multiple weeping areas as usual 02/09/18; The same wrap on for 10 days. He did not pick up the last doxycycline I prescribed for him. He apparently took 4 days worth he already had. There is nothing open on his right leg and the edema control is really quite good. He's had damage in the left leg medially and laterally especially probably related to the prolonged use of Unna boots 02/12/18; the patient arrived in clinic today for a nurse visit/wrap change. He complained of a lot of pain in the left posterior calf. He is taking doxycycline that I previously prescribed for him. Unfortunately even though he used his stockings and apparently used to compression pumps twice a day he has weeping edema coming out of the lateral part of his right leg. This is coming from the lower anterior lateral skin area. 02/16/18; the patient has finished his doxycycline and will finish the amoxicillin 2 days. The area of cellulitis in the left calf posteriorly has resolved. He is no longer having any pain. He tells me he is using his compression pumps at least once a day sometimes twice. 02/23/18; the patient finished his doxycycline and Amoxil last week. On Friday he noticed a small erythematous circle about the size of a quarter on the left lower leg just above his ankle. This rapidly expanded and he now has erythema on the lateral and posterior part of the thigh. This is bright red. Also has an area on the dorsal foot just above his toes and a tender area just below the left popliteal fossa. He came off his prophylactic penicillin injections at his own insistence one or 2 months ago. This is obviously deteriorated since then 03/02/18; patient is on doxycycline and Amoxil. Culture I did last week of the weeping area on the  back of his left calf grew group B strep. I have therefore renewed the amoxicillin 500 3 times a day for a further week. He has not been systemically unwell. Still complaining of an area of discomfort right under his left popliteal fossa. There is no open wound on the right leg. He tells me that he is using his pumps twice a day on most days 03/09/18; patient arrives in clinic today completing his amoxicillin today. The cellulitis on his left leg is better. Furthermore he tells me that he had intramuscular penicillin shots that his primary care office today. However he also states that the wrap on his right leg fell down shortly after leaving clinic last week. He developed a large blister that was present when he came in for a nurse visit later in the week and then he developed intense discomfort around this area.He tells me he is using his compression pumps 03/16/18; the patient has completed his doxycycline. The infectious part of this/cellulitis in the left heel area left  popliteal area is a lot better. He has 2 open areas on the right calf. Still areas on the left calf but this is a lot better as well. 03/24/18; the patient arrives complaining of pain in the left popliteal area again. He thinks some of this is wrap injury. He has no open area on the right leg and really no open area on the left calf either except for the popliteal area. He claims to be compliant with the compression pumps 03/31/18; I gave him doxycycline last week because of cellulitis in the left popliteal area. This is a lot better although the surface epithelium is denuded off and response to this. He arrives today with uncontrolled edema in the right calf area as well as a fingernail injury in the right lateral calf. There is only a few open areas on the left 04/06/18; I gave him amoxicillin doxycycline over the last 2 weeks that the amoxicillin should be completing currently. He is not complaining of any pain or systemic symptoms.  The only open areas see has is on the right lateral lower leg paradoxically I cannot see anything on the left lower leg. He tells me he is using his compression pumps twice a day on most days. Silver alginate to the wounds that are open under 4 layer compression 04/13/18; he completed antibiotics and has no new complaints. Using his compression pumps. Silver alginate that anything that's opened 04/20/18; he is using his compression pumps religiously. Silver alginate 4 layer compression anything that's opened. He comes in today with no open wounds on the left leg but 3 on the right including a new one posteriorly. He has 2 on the right lateral and one on the right posterior. He likes Unna boots on the right leg for reasons that aren't really clear we had the usual 4 layer compression on the left. It may be necessary to move to the 4 layer compression on the right however for now I left them in the Unna boots 04/27/18; he is using his compression pumps at least once a day. He has still the wounds on the right lateral calf. The area right posteriorly has closed. He does not have an open wound on the left under 4 layer compression however on the dorsal left foot just proximal to the toes and the left third toe 2 small open areas were identified 05/11/18; he has not uses compression pumps. The areas on the right lateral calf have coalesced into one large wound necrotic surface. On the left side he has one small wound anteriorly however the edema is now weeping out of a large part of his left leg. He says he wasn't using his pumps because of the weeping fluid. I explained to him that this is the time he needs to pump more 05/18/18; patient states he is using his compression pumps twice a day. The area on the right lateral large wound albeit superficial. On the left side he has innumerable number of small new wounds on the left calf particularly laterally but several anteriorly and medially. All these appear  to have healthy granulated base these look like the remnants of blisters however they occurred under compression. The patient arrives in clinic today with his legs somewhat better. There is certainly less edema, less multiple open areas on the left calf and the right anterior leg looks somewhat better as well superficial and a little smaller. However he relates pain and erythema over the last 3-4 days in the thigh and  I looked at this today. He has not been systemically unwell no fever no chills no change in blood sugar values 05/25/18; comes in today in a better state. The severe cellulitis on his left leg seems better with the Keflex. Not as tender. He has not been systemically unwell ooHard to find an open wound on the left lower leg using his compression pumps twice a day ooThe confluent wounds on his right lateral calf somewhat better looking. These will ultimately need debridement I didn't do this today. 06/01/18; the severe cellulitis on the left anterior thigh has resolved and he is completed his Keflex. ooThere is no open wound on the left leg however there is a superficial excoriation at the base of the third toe dorsally. Skin on the bottom of his left foot is macerated looking. ooThe left the wounds on the lateral right leg actually looks some better although he did require debridement of the top half of this wound area with an open curet 06/09/18 on evaluation today patient appears to be doing poorly in regard to his right lower extremity in particular this appears to likely be infected he has very thick purulent discharge along with a bright green tent to the discharge. This makes me concerned about the possibility of pseudomonas. He's also having increased discomfort at this point on evaluation. Fortunately there does not appear to be any evidence of infection spreading to the other location at this time. 06/16/18 on evaluation today patient appears to actually be doing fairly well. His  ulcer has actually diminished in size quite significantly at this point which is good news. Nonetheless he still does have some evidence of infection he did see infectious disease this morning before coming here for his appointment. I did review the results of their evaluation and their note today. They did actually have him discontinue the Cipro and initiate treatment with linezolid at this time. He is doing this for the next seven days and they recommended a follow-up in four months with them. He is the keep a log of the need for intermittent antibiotic therapy between now and when he falls back up with infectious disease. This will help them gaze what exactly they need to do to try and help them out. 06/23/18; the patient arrives today with no open wounds on the left leg and left third toe healed. He is been using his compression pumps twice a day. On the right lateral leg he still has a sizable wound but this is a lot better than last time I saw this. 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 Has been using silver alginate under 4 layer compression 06/30/18; the only open wound he has is on the right lateral leg and this looks healthy. No debridement is required. We have been using silver alginate. He does not have an open wound on the left leg. There is apparently some drainage from the dorsal proximal third toe on the left although I see no open wound here. 07/03/18 on evaluation today patient was actually here just for a nurse visit rapid change. However when he was here on Wednesday for his rat change due to having been healed on the left and then developing blisters we initiated the wrap again knowing that he would be back today for [place] to reevaluate and see were at. Unfortunately he has developed some cellulitis into the proximal portion of  his right lower extremity even into the region of his  thigh. 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 He was placed on one as what at that point. Nonetheless he is done with that and has been tolerating it well otherwise. Doxycycline which in the past really did not seem to be effective for him. Nonetheless I think the best option may be for [place] to definitely reinitiate the antibiotics for a longer period of time. 07/07/18; since I last saw this patient a week ago he has had a difficult time. At that point he did not have an open wound on his left leg. We transitioned him into juxta light stockings. He was apparently in the clinic the next day with blisters on the left lateral and left medial lower calf. He also had weeping edema fluid. He was put back into a compression wrap. He was also in the clinic on Friday with intense erythema in his right thigh. Per the patient he was started on Bactrim however that didn't work at all in terms of relieving his pain and swelling. He has taken 3 doxycycline that he had left over from last time and that seems to of helped. He has blistering on the right thigh as well. 07/14/18; the erythema on his right thigh has gotten better with doxycycline that he is finishing. 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 Presumably this cellulitis in the thigh was not related to that although I think this is a bit concerning going forward. He still has an area on the right lateral calf the blister on the right medial calf just below the knee that was discussed above. On the left 2 small open areas left medial and left lateral. Edema control is adequate. He is using his compression pumps twice a day 07/20/18; continued improvement in the condition of both legs especially the edema in his bilateral thighs. He tells me he is been losing weight through a combination of diet and exercise. He is using his compression pumps  twice a day. So overall she made to the remaining wounds 07/27/2018; continued improvement in condition of both legs. His edema is well controlled. The area on the right lateral leg is just about closed he had one blisters show up on the medial left upper calf. We have him in 4 layer compression. He is going on a 10-day trip to [place]. He will be driving. He wants to wear Unna boots because of the lessening amount of constriction. He will not use compression pumps while he is away 08/05/18 on evaluation today patient actually appears to be doing decently well all things considered in regard to his bilateral lower extremities. The worst ulcer is actually only posterior aspect of his left lower extremity with a four layer compression wrap cut into his leg a couple weeks back. He did have a trip and actually had [employer] for the trip that he is worn since he was last here. Nonetheless he feels like the [employer] actually do better for him his swelling is up a little bit but he also with his trip was not taking his Lasix on a regular set schedule like he was supposed to be. He states that obviously the reason being that he cannot drive and keep going without having to urinate too frequently which makes  it difficult. He did not have his pumps with him while he was away either which I think also maybe playing a role here too. 08/13/2018; the patient only has a small open wound on the right lateral calf which is a big improvement in the last month or 2. He also has the area posteriorly just below the posterior fossa on the left which I think was a wrap injury from several weeks ago. He has no current evidence of cellulitis. He tells me he is back into his compression pumps twice a day. He also tells me that while he was at the laundromat somebody stole a section of his extremitease stockings 08/20/2018; back in the clinic with a much improved state. He only has small  areas on the right lateral mid calf which is just about healed. This was is more substantial area for quite a prolonged period of time. He has a small open area on the left anterior tibia. The area on the posterior calf just below the popliteal fossa is closed today. He is using his compression pumps twice a day 08/28/2018; patient has no open wound on the right leg. He has a smattering of open areas on the calf with some weeping lymphedema. More problematically than that it looks as though his wraps of slipped down in his usual he has very angry upper area of edema just below the right medial knee and on the right lateral calf. He has no open area on his feet. The patient is traveling to [facility] next week. I will send him in an antibiotic. We will continue to wrap the right leg. We ordered extremitease stockings for him last week and I plan to transition the right leg to a stocking when he gets home which will be in 10 days time. As usual he is very reluctant to take his pumps with him when he travels 09/07/2018; patient returns from [facility]. He shows me a picture of his left leg in the mid part of his trip last week with intense fire engine erythema. The picture look bad enough I would have considered sending him to the hospital. Instead he went to the wound care center in [facility]. They did not prescribe him antibiotics but he did take some doxycycline he had leftover from a previous visit. I had given him trimethoprim sulfamethoxazole before he left this did not work according to the patient. This is resulted in some improvement fortunately. He comes back with a large wound on the left posterior calf. Smaller area on the left anterior tibia. Denuded blisters on the dorsal left foot over his toes. Does not have much in the way of wounds on the right leg although he does have a very tender area on the right posterior area just below the popliteal fossa also suggestive of  infection. He promises me he is back on his pumps twice a day 09/15/2018; the intense cellulitis in his left lower calf is a lot better. The wound area on the posterior left calf is also so better. However he has reasonably extensive wounds on the dorsal aspect of his second and third toes and the proximal foot just at the base of the toes. There is nothing open on the right leg 09/22/2018; the patient has excellent edema control in his legs bilaterally. He is using his external compression pumps twice a day. He has no open area on the right leg and only the areas in the left foot dorsally second  and third toe area on the left side. He does not have any signs of active cellulitis. 10/06/2018; the patient has good edema control bilaterally. He has no open wound on the right leg. There is a blister in the posterior aspect of his left calf that we had to deal with today. He is using his compression pumps twice a day. There is no signs of active cellulitis. We have been using silver alginate to the wound areas. He still has vulnerable areas on the base of his left first second toes dorsally He has a his extremities stockings and we are going to transition him today into the stocking on the right leg. He is cautioned that he will need to continue to use the compression pumps twice a day. If he notices uncontrolled edema in the right leg he may need to go to 3 times a day. 10/13/2018; the patient came in for a nurse check on Friday he has a large flaccid blister on the right medial calf just below the knee. We unroofed this. He has this and a new area underneath the posterior mid calf which was undoubtedly a blister as well. He also has several small areas on the right which is the area we put his extremities stocking on. 10/19/2018; the patient went to see infectious disease this morning I am not sure if that was a routine follow-up in any case the doxycycline I had given him was discontinued and started on  linezolid. He has not started this. It is easy to look at his left calf and the inflammation and think this is cellulitis however he is very tender in the tissue just below the popliteal fossa and I have no doubt that there is infection going on here. He states the problem he is having is that with the compression pumps the edema goes down and then starts walking the wrap falls down. We will see if we can adhere this. He has 1 or 2 minuscule open areas on the right still areas that are weeping on the posterior left calf, the base of his left second and third toes 10/26/18; back today in clinic with quite of skin breakdown in his left anterior leg. This may have been infection the area below the popliteal fossa seems a lot better however tremendous epithelial loss on the left anterior mid tibia area over quite inexpensive tissue. He has 2 blisters on the right side but no other open wound here. 10/29/2018; came in urgently to see [place] today and we worked him in for review. He states that the 4 layer compression on the right leg caused pain he had to cut it down to roughly his mid calf this caused swelling above the wrap and he has blisters and skin breakdown today. As a result of the pain he has not been using his pumps. Both legs are a lot more edematous and there is a lot of weeping fluid. 11/02/18; arrives in clinic with continued difficulties in the right leg> left. Leg is swollen and painful. multiple skin blisters and new open areas especially laterally. He has not been using his pumps on the right leg. 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 He is not systemically unwell. 11/09/2018; the patient claims he is being compliant with his pumps. He is finished the doxycycline I gave him last week. Culture I did of the wound on the right lateral leg showed a few very resistant methicillin staph aureus.  This was resistant to doxycycline. Nevertheless he states  the pain in the leg is a lot better which makes me wonder if the cultured organism was not really what was causing the problem nevertheless this is a very dangerous organism to be culturing out of any wound. His right leg is still a lot larger than the left. He is using an Radio broadcast assistant on this area, he blames a 4-layer compression for causing the original skin breakdown which I doubt is true however I cannot talk him out of it. We have been using silver alginate to all of these areas which were initially blisters 11/16/2018; patient is being compliant with his external compression pumps at twice a day. Miraculously he arrives in clinic today with absolutely no open wounds. He has better edema control on the left where he has been using 4 layer compression versus wound of wounds on the right and I pointed this out to him. There is no inflammation in the skin in his lower legs which is also somewhat unusual for him. There is no open wounds on the dorsal left foot. He has extremitease stockings at home and I have asked him to bring these in next week. 11/25/18 patient's lower extremity on examination today on the left appears for the most part to be wound free. He does have an open wound on the lateral aspect of the right lower extremity but this is minimal compared to what I've seen in past. He does request that we go ahead and wrap the left leg as well even though there's nothing open just so hopefully it will not reopen in short order. 1/28; patient has superficial open wounds on the right lateral calf left anterior calf and left posterior calf. His edema control is adequate. 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 We have been using silver alginate as the primary dressing. He claims compliance with his compression pumps 2/4; patient has superficial open wounds on numerous areas of his left calf and again one on the  left dorsal foot. The areas on the right lateral calf have healed. The cellulitis that I gave him doxycycline for last week is also resolved this was mostly on the left anterior calf just below the tibial tuberosity. His edema looks fairly well-controlled. He tells me he went to see his primary doctor today and had blood work ordered 2/11; once again he has several open areas on the left calf left tibial area. Most of these are small and appear to have healthy granulation. He does not have anything open on the right. The edema and control in his thighs is pretty good which is usually a good indication he has been using his pumps as requested. 2/18; he continues to have several small areas on the left calf and left tibial area. Most of these are small healthy granulation. We put him in his stocking on the right leg last week and he arrives with a superficial open area over the right upper tibia and a fairly large area on the right lateral tibia in similar condition. His edema control actually does not look too bad, he claims to be using his compression pumps twice a day 2/25. Continued small areas on the left calf and left tibial area. New areas especially on the right are identified just below the tibial tuberosity and on the right upper tibia itself. There are also areas of weeping edema  fluid even without an obvious wound. He does not have a considerable degree of lymphedema but clearly there is more edema here than his skin can handle. He states he is using the pumps twice a day. We have an Unna boot on the right and 4 layer compression on the left. 3/3; he continues to have an area on the right lateral calf and right posterior calf just below the popliteal fossa. There is a fair amount of tenderness around the wound on the popliteal fossa but I did not see any evidence of cellulitis, could just be that the wrap came down and rubbed in this area. ooHe does not have an open area on the left leg  however there is an area on the left dorsal foot at the base of the third toe ooWe have been using silver alginate to all wound areas 3/10; he did not have an open area on his left leg last time he was here a week ago. T oday he arrives with a horizontal wound just below the tibial tuberosity and an area on the left lateral calf. He has intense erythema and tenderness in this area. The area is on the right lateral calf and right posterior calf better than last week. We have been using silver alginate as usual 3/18 - Patient returns with 3 small open areas on left calf, and 1 small open area on right calf, the skin looks ok with no significant erythema, he continues the UNA boot on right and 4 layer compression on left. The right lateral calf wound is closed , the right posterior is small area. we will continue silver alginate to the areas. 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 He is been tolerating the dressing changes without complication. Fortunately she has made excellent improvement in regard to the overall status of his wounds. Unfortunately every time we cease wrapping him he ends up reopening in causing more significant issues at that point. Again I'm unsure of the best direction to take although I think the lymphedema clinic may be appropriate for him. 02/03/19 on evaluation today patient appears to be doing well in regard to the wounds that we saw him for last week unfortunately he has a new area on the proximal portion of his right medial/posterior lower extremity where the wrap somewhat slowed down and caused swelling and a blister to rub and open. Unfortunately this is the only opening that he has on either leg at this point. 02/17/19 on evaluation today patient's bilateral lower extremities  appear to be doing well. He still completely healed in regard to the left lower extremity. In regard to the right lower extremity the area where the wrap and slid down and caused the blister still seems to be slightly open although this is dramatically better than during the last evaluation two weeks ago. I'm very pleased with the way this stands overall. 03/03/19 on evaluation today patient appears to be doing well in regard to his right lower extremity in general although he did have a new blister open this does not appear to be showing any evidence of active infection at this time. Fortunately there's No fevers, chills, nausea, or vomiting noted at this time. Overall I feel like he is making good progress it does feel like that the right leg will  we perform the [employer] seems to do with a bit better than three layer wrap on the left which slid down on him. We may switch to doing bilateral in the book wraps. 5/4; I have not seen Mr. Guess in quite some time. According to our case manager he did not have an open wound on his left leg last week. He had 1 remaining wound on the right posterior medial calf. He arrives today with multiple openings on the left leg probably were blisters and/or wrap injuries from Unna boots. I do not think the Unna boot's will provide adequate compression on the left. I am also not clear about the frequency he is using the compression pumps. 03/17/19 on evaluation today patient appears to be doing excellent in regard to his lower extremities compared to last week's evaluation apparently. He had gotten significantly worse last week which is unfortunate. The [employer] wrap on the left did not seem to do very well for him at all and in fact it didn't control his swelling significantly enough he had an additional outbreak. Subsequently we go back to the four layer compression wrap on the left. This is good news. At least in that he is doing better and the wound seem to be  killing him. He still has not heard anything from the lymphedema clinic. 03/24/19 on evaluation today patient actually appears to be doing much better in regard to his bilateral lower Trinity as compared to last week when I saw him. Fortunately there's no signs of active infection at this time. He has been tolerating the dressing changes without complication. Overall I'm extremely pleased with the progress and appearance in general. 04/07/19 on evaluation today patient appears to be doing well in regard to his bilateral lower extremities. His swelling is significantly down from where it was previous. With that being said he does have a couple blisters still open at this point but fortunately nothing that seems to be too severe and again the majority of the larger openings has healed at this time. 04/14/19 on evaluation today patient actually appears to be doing quite well in regard to his bilateral lower extremities in fact I'm not even sure there's anything significantly open at this time at any site. Nonetheless he did have some trouble with these wraps where they are somewhat irritating him secondary to the fact that he has noted that the graph wasn't too close down to the end of this foot in a little bit short as well up to his knee. Otherwise things seem to be doing quite well. 04/21/19 upon evaluation today patient's wound bed actually showed evidence of being completely healed in regard to both lower extremities which is excellent news. There does not appear to be any signs of active infection which is also good news. I'm very pleased in this regard. No fevers, chills, nausea, or vomiting noted at this time. 04/28/19 on evaluation today patient appears to be doing a little bit worse in regard to both lower extremities on the left mainly due to the fact that when he went infection disease the wrap was not wrapped quite high enough he developed a blister above this. On the right he is a small open area  of nothing too significant but again this is continuing to give him some trouble he has been were in the Velcro compression that he has at home. 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 He's  been tolerating the dressing changes without complication. Fortunately there's no signs of active infection at this time. No fevers, chills, nausea, or vomiting noted at this time. We have been trying to get an appointment with her lymphedema clinic in [facility] but unfortunately nobody can get them on phone with not been able to even fax information over the patient likewise is not been able to get in touch with them. Overall I'm not sure exactly what's going on here with to reach out again today. 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 Still having a lot of drainage unfortunately. He tells me especially in the left but even on the right. There's no signs of active infection which is good news we've been using so ratcheted up to this point. 05/19/19 on evaluation today patient actually appears to be doing quite well with regard to his left lower extremity which is great news. Fortunately in regard to the right lower extremity has an issues with his wrap and he subsequently did remove this from what I'm understanding. Nonetheless long story short is what he had rewrapped once he removed it subsequently had maggots underneath this wrap whenever he came in for evaluation today. With that being said they were obviously completely cleaned away by the nursing staff. The visit today which is excellent news. However he does appear to potentially have some infection around the right ankle region where the maggots were located as well. He will likely require anabiotic therapy today. 05/26/19 on evaluation today patient actually appears to be doing much better  in regard to his bilateral lower extremities. I feel like the infection is under much better control. With that being said there were maggots noted when the wrap was removed yet again today. Again this could have potentially been left over from previous although at this time there does not appear to be any signs of significant drainage there was obviously on the wrap some drainage as well this contracted gnats or otherwise. Either way I do not see anything that appears to be doing worse in my pinion and in fact I think his drainage has slowed down quite significantly likely mainly due to the fact to his infection being under better control. 06/02/2019 on evaluation today patient actually appears to be doing well with regard to his bilateral lower extremities there is no signs of active infection at this time which is great news. With that being said he does have several open areas more so on the right than the left but nonetheless these are all significantly better than previously noted. 06/09/2019 on evaluation today patient actually appears to be doing well. His wrap stayed up and he did not cause any problems he had more drainage on the right compared to the left but overall I do not see any major issues at this time which is great news. 06/16/2019 on evaluation today patient appears to be doing excellent with regard to his lower extremities the only area that is open is a new blister that can have opened as of today on the medial ankle on the left. Other than this he really seems to be doing great I see no major issues at this point. 06/23/2019 on evaluation today patient appears to be doing quite well with regard to his bilateral lower extremities. In fact he actually appears to be almost completely healed there is a small area of weeping noted of  the right lower extremity just above the ankle. Nonetheless fortunately there is no signs of active infection at this time which is good news. No fevers,  chills, nausea, vomiting, or diarrhea. 8/24; the patient arrived for a nurse visit today but complained of very significant pain in the left leg and therefore I was asked to look at this. Noted that he did not have an open area on the left leg last week nevertheless this was wrapped. The patient states that he is not been able to put his compression pumps on the left leg because of the discomfort. He has not been systemically unwell 06/30/2019 on evaluation today patient unfortunately despite being excellent last week is doing much worse with regard to his left lower extremity today. In fact he had to come in for a nurse on Monday where his left leg had to be rewrapped due to excessive weeping Dr. Leanord Hawking placed him on doxycycline at that point. Fortunately there is no signs of active infection Systemically at this time which is good news. 07/07/2019 in regard to the patient's wounds today he actually seems to be doing well with his right lower extremity there really is nothing open or draining at this point this is great news. Unfortunately the left lower extremity is given him additional trouble at this time. There does not appear to be any signs of active infection nonetheless he does have a lot of edema and swelling noted at this point as well as blistering all of which has led to a much more poor appearing leg at this time compared to where it was 2 weeks ago when it was almost completely healed. Obviously this is a little discouraging for the patient. He is try to contact the lymphedema clinic in [place] he has not been able to get through to them. 07/14/2019 on evaluation today patient actually appears to be doing slightly better with regard to his left lower extremity ulcers. Overall I do feel like at least at the top of the wrap that we have been placing this area has healed quite nicely and looks much better. The remainder of the leg is showing signs of improvement. Unfortunately in the thigh  area he still has an open region on the left and again on the right he has been utilizing just a Band-Aid on an area that also opened on the thigh. Again this is an area that were not able to wrap although we did do an Ace wrap to provide some compression that something that obviously is a little less effective than the compression wraps we have been using on the lower portion of the leg. He does have an appointment with the lymphedema clinic in [facility] on Friday. 07/21/2019 on evaluation today patient appears to be doing better with regard to his lower extremity ulcers. He has been tolerating the dressing changes without complication. Fortunately there is no signs of active infection at this time. No fevers, chills, nausea, vomiting, or diarrhea. I did receive the paperwork from the physical therapist at the lymphedema clinic in [place]. Subsequently I signed off on that this morning and sent that back to him for further progression with the treatment plan. 07/28/2019 on evaluation today patient appears to be doing very well with regard to his right lower extremity where I do not see any open wounds at this point. Fortunately he is feeling great as far as that is concerned as well. In regard to the left lower extremity he has been having issues  with still several areas of weeping and edema although the upper leg is doing better his lower leg still I think is going require the compression wrap at this time. No fevers, chills, nausea, vomiting, or diarrhea. 08/04/2019 on evaluation today patient unfortunately is having new wounds on the right lower extremity. Again we have been using Unna boot wrap on that side. We switched him to using his juxta lite wrap at home. With that being said he tells me he has been using it although his legs extremely swollen and to be honest really does not appear that he has been. I cannot know that for sure however. Nonetheless he has multiple new wounds on the  right lower extremity at this time. Obviously we will have to see about getting this rewrapped for him today. 08/11/2019 on evaluation today patient appears to be doing fairly well with regard to his wounds. He has been tolerating the dressing changes including the compression wraps without complication. He still has a lot of edema in his upper thigh regions bilaterally he is supposed to be seeing the lymphedema clinic on the 15th of this month once his wraps arrive for the upper part of his legs. 08/18/2019 on evaluation today patient appears to be doing well with regard to his bilateral lower extremities at this point. He has been tolerating the dressing changes without complication. Fortunately there is no signs of active infection which is also good news. He does have a couple weeping areas on the first and second toe of the right foot he also has just a small area on the left foot upper leg and a small area on the left lower leg but overall he is doing quite well in my opinion. He is supposed to be getting his wraps shortly in fact tomorrow and then subsequently is seeing the lymphedema clinic next Wednesday on the 21st. Of note he is also leaving on the 25th to go on vacation for a week to the beach. For that reason and since there is some uncertainty about what there can be doing at lymphedema clinic next Wednesday I am get a make an appointment for next Friday here for [place] to see what we need to do for him prior to him leaving for vacation. 10/23; patient arrives in considerable pain predominantly in the upper posterior calf just distal to the popliteal fossa also in the wound anteriorly above the major wound. This is probably cellulitis and he has had this recurrently in the past. He has no open wound on the right side and he has had an Radio broadcast assistant in that area. Finally I note that he has an area on the left posterior calf which by enlarge is mostly epithelialized. This protrudes beyond the  borders of the surrounding skin in the setting of dry scaly skin and lymphedema. The patient is leaving for [facility] on Sunday. Per his longstanding pattern, he will not take his compression pumps with him predominantly out of fear that they will be stolen. He therefore asked that we put a Unna boot back on the right leg. He will also contact the wound care center in [facility] to see if they can change his dressing in the mid week. 11/3; patient returned from his vacation to [facility]. He was seen on 1 occasion at their wound care center. They did a 2 layer compression system as they did not have our 4-layer wrap. I am not completely certain what they put on the wounds.  They did not change the Unna boot on the right. The patient is also seeing a lymphedema specialist physical therapist in [place]. It appears that he has some compression sleeve for his thighs which indeed look quite a bit better than I am used to seeing. He pumps over these with his external compression pumps. 11/10; the patient has a new wound on the right medial thigh otherwise there is no open areas on the right. He has an area on the left leg posteriorly anteriorly and medially and an area over the left second toe. We have been using silver alginate. He thinks the injury on his thigh is secondary to friction from the compression sleeve he has. 11/17; the patient has a new wound on the right medial thigh last week. He thinks this is because he did not have a underlying stocking for his thigh juxta lite apparatus. He now has this. The area is fairly large and somewhat angry but I do not think he has underlying cellulitis. ooHe has a intact blister on the right anterior tibial area. ooSmall wound on the right great toe dorsally ooSmall area on the medial left calf. 11/30; the patient does not have any open areas on his right leg and we did not take his juxta lite stocking off. However he states that on Friday  his compression wrap fell down lodging around his upper mid calf area. As usual this creates a lot of problems for him. He called urgently today to be seen for a nurse visit however the nurse visit turned into a provider visit because of extreme erythema and pain in the left anterior tibia extending laterally and posteriorly. The area that is problematic is extensive 10/06/2019 upon evaluation today patient actually appears to be doing poorly in regard to his left lower extremity. He Dr. Leanord Hawking did place him on doxycycline this past Monday apparently due to the fact that he was doing much worse in regard to this left leg. Fortunately the doxycycline does seem to be helping. Unfortunately we are still having a very difficult time getting his edema under any type of control in order to anticipate discharge at some point. The only way were really able to control his lymphedema really is with compression wraps and that has only even seemingly temporary. He has been seeing a lymphedema clinic they are trying to help in this regard but still this has been somewhat frustrating in general for the patient. 10/13/19 on evaluation today patient appears to be doing excellent with regard to his right lower extremity as far as the wounds are concerned. His swelling is still quite extensive unfortunately. He is still having a lot of drainage from the thigh areas bilaterally which is unfortunate. He's been going to lymphedema clinic but again he still really does not have this edema under control as far as his lower extremities are concern. With regard to his left lower extremity this seems to be improving and I do believe the doxycycline has been of benefit for him. He is about to complete the doxycycline. 10/20/2019 on evaluation today patient appears to be doing poorly in regard to his bilateral lower extremities. More in the right thigh he has a lot of irritation at this site unfortunately. In regard to the left  lower extremity the wrap was not quite as high it appears and does seem to have caused him some trouble as well. Fortunately there is no evidence of systemic infection though he does have some blue-green drainage which  has me concerned for the possibility of Pseudomonas. He tells me he is previously taking Cipro without complications and he really does not care for Levaquin however due to some of the side effects he has. He is not allergic to any medications specifically antibiotics that were aware of. 10/27/2019 on evaluation today patient actually does appear to be for the most part doing better when compared to last week's evaluation. With that being said he still has multiple open wounds over the bilateral lower extremities. He actually forgot to start taking the Cipro and states that he still has the whole bottle. He does have several new blisters on left lower extremity today I think I would recommend he go ahead and take the Cipro based on what I am seeing at this point. 12/30-Patient comes at 1 week visit, 4 layer compression wraps on the left and Unna boot on the right, primary dressing Xtrasorb and silver alginate. Patient is taking his Cipro and has a few more days left probably 5-6, and the legs are doing better. He states he is using his compressions devices which I believe he has 11/10/2019 on evaluation today patient actually appears to be much better than last time I saw him 2 weeks ago. His wounds are significantly improved and overall I am very pleased in this regard. Fortunately there is no signs of active infection at this time. He is just a couple of days away from completing Cipro. Overall his edema is much better he has been using his lymphedema pumps which I think is also helping at this point. 11/17/2019 on evaluation today patient appears to be doing excellent in regard to his wounds in general. His legs are swollen but not nearly as much as they have been in the past.  Fortunately he is tolerating the compression wraps without complication. No fevers, chills, nausea, vomiting, or diarrhea. He does have some erythema however in the distal portion of his right lower extremity specifically around the forefoot and toes there is a little bit of warmth here as well. 11/24/2019 on evaluation today patient appears to be doing well with regard to his right lower extremity I really do not see any open wounds at this point. His left lower extremity does have several open areas and his right medial thigh also is open. Other than this however overall the patient seems to be making good progress and I am very pleased at this point. 12/01/2019 on evaluation today patient appears to be doing poorly at this point in regard to his left lower extremity has several new blisters despite the fact that we have him in compression wraps. In fact he had a 4-layer compression wrap, his upper thigh wrapped from lymphedema clinic, and a juxta light over top of the 4 layer compression wrap the lymphedema clinic applied and despite all this he still develop blisters underneath. Obviously this does have me concerned about the fact that unfortunately despite what we are doing to try to get wounds healed he continues to have new areas arise I do not think he is ever good to be at the point where he can realistically just use wraps at home to keep things under control. Typically when we heal him it takes about 1-2 days before he is back in the clinic with severe breakdown and blistering of his lower extremities bilaterally. This is happened numerous times in the past. Unfortunately I think that we may need some help as far as overall fluid overload to kind  of limit what we are seeing and get things under better control. 12/08/2019 on evaluation today patient presents for follow-up concerning his ongoing bilateral lower extremity edema. Unfortunately he is still having quite a bit of swelling the  compression wraps are controlling this to some degree but he did see Dr. [name] his cardiologist I do have that available for review today as far as the appointment was concerned that was on 12/06/2019. Obviously that she has been 2 days ago. The patient states that he is only been taking the Lasix 80 mg 1 time a day he had told me previously he was taking this twice a day. Nonetheless Dr. [name] recommended this be up to 80 mg 2 times a day for the patient as he did appear to be fluid overloaded. With that being said the patient states he did this yesterday and he was unable to go anywhere or do anything due to the fact that he was constantly having to urinate. Nonetheless I think that this is still good to be something that is important for him as far as trying to get his edema under control at all things that he is going to be able to just expect his wounds to get under control and things to be better without going through at least a period of time where he is trying to stabilize his fluid management in general and I think increasing the Lasix is likely the first step here. It was also mentioned the possibility that the patient may require metolazone. With that being said he wanted to have the patient take Lasix twice a day first and then reevaluating 2 months to see where things stand. 12/15/2019 upon evaluation today patient appears to be doing regard to his legs although his toes are showing some signs of weeping especially on the left at this point to some degree on the right. There does not appear to be any signs of active infection and overall I do feel like the compression wraps are doing well for him but he has not been able to take the Lasix at home and the increased dose that Dr. [name] recommended. He tells me that just not go to be feasible for him. Nonetheless I think in this case he should probably send a message to Dr. [name] in order to discuss options from the standpoint of possible  admission to get the fluid off or otherwise going forward. 12/22/2019 upon evaluation today patient appears to be doing fairly well with regard to his lower extremities at this point. In fact he would be doing excellent if it was not for the fact that his right anterior thigh apparently had an allergic reaction to adhesive tape that he used. The wound itself that we have been monitoring actually appears to be healed. There is a lot of irritation at this point. 12/29/2019 upon evaluation today patient appears to be doing well in regard to his lower extremities. His left medial thigh is open and somewhat draining today but this is the only region that is open the right has done much better with the treatment utilizing the steroid cream that I prescribed for him last week. Overall I am pleased in that regard. Fortunately there is no signs of active infection at this time. No fevers, chills, nausea, vomiting, or diarrhea. 01/05/2020 upon evaluation today patient appears to be doing more poorly in regard to his right lower extremity at this point upon evaluation today. Unfortunately he continues to have issues in this  regard and I think the biggest issue is controlling his edema. This obviously is not very well controlled at this point is been recommended that he use the Lasix twice a day but he has not been able to do that. Unfortunately I think this is leading to an issue where honestly he is not really able to effectively control his edema and therefore the wounds really are not doing significantly better. I do not think that he is going to be able to keep things under good control unless he is able to control his edema much better. I discussed this again in great detail with him today. 01/12/2020 good news is patient actually appears to be doing quite well today at this point. He does have an appointment with lymphedema clinic tomorrow. His legs appear healed and the toe on the left is almost completely  healed. In general I am very pleased with how things stand at this point. 01/19/2020 upon evaluation today patient appears to actually be doing well in regard to his lower extremities there is nothing open at this point. Fortunately he has done extremely well more recently. Has been seeing lymphedema clinic as well. With that being said he has Velcro wraps for his lower legs as well as his upper legs. The only wound really is on his toe which is the right great toe and this is barely anything even there. With all that being said I think it is good to be appropriate today to go ahead and switch him over to the Velcro compression wraps. 01/26/2020 upon evaluation today patient appears to be doing worse with regard to his lower extremities after last week switch him to Velcro compression wraps. Unfortunately he lasted less than 24 hours he did not have the sock portion of his Velcro wrap on the left leg and subsequently developed a blister underneath the Velcro portion. Obviously this is not good and not what we were looking for at this point. He states the lymphedema clinic did tell him to wear the wrap for 23 hours and take him off for 1 I am okay with that plan but again right now we got a get things back under control again he may have some cellulitis noted as well. 02/02/2020 upon evaluation today patient unfortunately appears to have several areas of blistering on his bilateral lower extremities today mainly on the feet. His legs do seem to be doing somewhat better which is good news. Fortunately there is no evidence of active infection at this time. No fevers, chills, nausea, vomiting, or diarrhea. 02/16/2020 upon evaluation today patient appears to be doing well at this time with regard to his legs. He has a couple weeping areas on his toes but for the most part everything is doing better and does appear to be sealed up on his legs which is excellent news. We can continue with wrapping him at this  point as he had every time we discontinue the wraps he just breaks out with new wounds. There is really no point in is going forward with this at this point. 03/08/2020 upon evaluation today patient actually appears to be doing quite well with regard to his lower extremity ulcers. He has just a very superficial and really almost nonexistent blister on the left lower extremity he has in general done very well with the compression wraps. With that being said I do not see any signs of infection at this time which is good news. 03/29/2020 upon evaluation today patient appears  to be doing well with regard to his wounds currently except for where he had several new areas that opened up due to some of the wrap slipping and causing him trouble. He states he did not realize they had slipped. Nonetheless he has a 1 area on the right and 3 new areas on the left. Fortunately there is no signs of active infection at this time which is great news. 04/05/2020 upon evaluation today patient actually appears to be doing quite well in general in regard to his legs currently. Fortunately there is no signs of active infection at this time. No fevers, chills, nausea, vomiting, or diarrhea. He tells me next week that he will actually be seen in the lymphedema clinic on Thursday at 10 AM I see him on Wednesday next week. 04/12/2020 upon evaluation today patient appears to be doing very well with regard to his lower extremities bilaterally. In fact he does not appear to have any open wounds at this point which is good news. Fortunately there is no signs of active infection at this time. No fevers, chills, nausea, vomiting, or diarrhea. 04/19/2020 upon evaluation today patient appears to be doing well with regard to his wounds currently on the bilateral lower extremities. There does not appear to be any signs of active infection at this time. Fortunately there is no evidence of systemic infection and overall very pleased at this point.  Nonetheless after I held him out last week he literally had blisters the next morning already which swelled up with him being right back here in the clinic. Overall I think that he is just not can be able to be discharged with his legs the way they are he is much to volume overloaded as far as fluid is concerned and that was discussed with him today of also discussed this but should try the clinic nurse manager as well as Dr. Leanord Hawking. 04/26/2020 upon evaluation today patient appears to be doing better with regard to his wounds currently. He is making some progress and overall swelling is under good control with the compression wraps. Fortunately there is no evidence of active infection at this time. 05/10/2020 on evaluation today patient appears to be doing overall well in regard to his lower extremities bilaterally. He is Tolerating the compression wraps without complication and with what we are seeing currently I feel like that he is making excellent progress. There is no signs of active infection at this time. 05/24/2020 upon evaluation today patient appears to be doing well in regard to his legs. The swelling is actually quite a bit down compared to where it has been in the past. Fortunately there is no sign of active infection at this time which is also good news. With that being said he does have several wounds on his toes that have opened up at this point. 05/31/2020 upon evaluation today patient appears to be doing well with regard to his legs bilaterally where he really has no significant fluid buildup at this point overall he seems to be doing quite well. Very pleased in this regard. With regard to his toes these also seem to be drying up which is excellent. We have continue to wrap him as every time we tried as a transition to the juxta light wraps things just do not seem to get any better. 06/07/2020 upon evaluation today patient appears to be doing well with regard to his right leg at this point.  Unfortunately left leg has a lot of blistering he  tells me the wrap started to slide down on him when he tried to put his other Velcro wrap over top of it to help keep things in order but nonetheless still had some issues. 06/14/2020 on evaluation today patient appears to be doing well with regard to his lower extremity ulcers and foot ulcers at this point. I feel like everything is actually showing signs of improvement which is great news overall there is no signs of active infection at this time. No fevers, chills, nausea, vomiting, or diarrhea. 06/21/2020 on evaluation today patient actually appears to be doing okay in regard to his wounds in general. With that being said the biggest issue I see is on his right foot in particular the first and second toe seem to be doing a little worse due to the fact this is staying very wet. I think he is probably getting need to change out his dressings a couple times in between each week when we see him in regard to his toes in order to keep this drier based on the location and how this is proceeding. 06/28/2020 on evaluation today patient appears to be doing a little bit more poorly overall in regard to the appearance of the skin I am actually somewhat concerned about the possibility of him having a little bit of an infection here. We discussed the course of potentially giving him a doxycycline prescription which he is taken previously with good result. With that being said I do believe that this is potentially mild and at this point easily fixed. I just do not want anything to get any worse. Objective Constitutional Well-nourished and well-hydrated in no acute distress. Vitals Time Taken: 10:21 AM, Height: 70 in, Weight: 380.2 lbs, BMI: 54.5, Temperature: 98.0 F, Pulse: 54 bpm, Respiratory Rate: 20 breaths/min, Blood Pressure: 160/70 mmHg, Capillary Blood Glucose: 146 mg/dl. General Notes: glucose per pt report Respiratory normal breathing without  difficulty. Psychiatric this patient is able to make decisions and demonstrates good insight into disease process. Alert and Oriented x 3. pleasant and cooperative. General Notes: Upon inspection patient's wound bed actually showed signs of good granulation at this time. There does not appear to be any signs of active systemic infection currently. No fevers, chills, nausea, vomiting, or diarrhea. With that being said I do feel like he has some local infection as far as her legs are concerned he has some small pustules that does have me worried in this regard. He has previously responded extremely well to doxycycline I would recommend that currently. Integumentary (Hair, Skin) Wound #177 status is Open. Original cause of wound was Blister. The wound is located on the Right T Great. The wound measures 0cm length x 0cm width x oe 0cm depth; 0cm^2 area and 0cm^3 volume. There is no tunneling or undermining noted. There is a none present amount of drainage noted. The wound margin is flat and intact. There is no granulation within the wound bed. There is no necrotic tissue within the wound bed. Wound #178 status is Open. Original cause of wound was Gradually Appeared. The wound is located on the Right T Second. The wound measures 0.2cm oe length x 0.2cm width x 0.1cm depth; 0.031cm^2 area and 0.003cm^3 volume. There is Fat Layer (Subcutaneous Tissue) exposed. There is no tunneling or undermining noted. There is a small amount of serous drainage noted. The wound margin is flat and intact. There is large (67-100%) pink granulation within the wound bed. There is no necrotic tissue within the wound  bed. Wound #180 status is Open. Original cause of wound was Blister. The wound is located on the Left,Proximal,Dorsal Foot. The wound measures 1cm length x 1.6cm width x 0.1cm depth; 1.257cm^2 area and 0.126cm^3 volume. There is Fat Layer (Subcutaneous Tissue) exposed. There is no tunneling or undermining noted.  There is a medium amount of serous drainage noted. The wound margin is flat and intact. There is large (67-100%) pink granulation within the wound bed. There is no necrotic tissue within the wound bed. 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 Post procedure Diagnosis Wound #: Same as Pre-Procedure There was a Four Layer Compression Therapy Procedure by Yevonne Pax, RN. Post procedure Diagnosis Wound #: Same as Pre-Procedure Plan Follow-up Appointments: Return Appointment in 1 week. Dressing Change Frequency: Wound #178 Right T Second: oe Change Dressing every other day. Wound #180 Left,Proximal,Dorsal Foot: Do not change entire dressing for one week. - both legs Skin Barriers/Peri-Wound Care: Moisturizing lotion - both legs and feet and toes Wound Cleansing: May shower with protection. Primary Wound Dressing: Wound #178 Right T Second: oe Calcium Alginate with Silver Wound #180 Left,Proximal,Dorsal Foot: Calcium Alginate with Silver Secondary Dressing: Wound #178 Right T Second: oe Kerlix/Rolled Gauze Dry Gauze Wound #180 Left,Proximal,Dorsal Foot: Dry Gauze Edema Control: 4 layer compression: Left lower extremity Unna Boot to Right Lower Extremity Avoid standing for long periods of time Elevate legs to the level of the heart or above for 30 minutes daily and/or when sitting, a frequency of: - throughout the day Exercise regularly The following medication(s) was prescribed: doxycycline  hyclate oral 100 mg capsule 1 1 capsule oral taken 2 times per day for 14 days starting 06/28/2020 1. I would recommend at this point that we go ahead and initiate treatment with a continuation of the wound care measures as before using silver alginate to any open wound areas. 2. I am also can recommend that we go ahead and give the patient a prescription for doxycycline. I do believe that that would be beneficial for him. 3. I am going to recommend that we continue with the compression wraps as before he seems to be doing well in that regard. We will see patient back for reevaluation in 1 week here in the clinic. If anything worsens or changes patient will contact our office for additional recommendations. Electronic Signature(s) Signed: 06/28/2020 11:18:00 AM By: Lenda Kelp PA-C Entered By: Lenda Kelp on 06/28/2020 11:18:00 -------------------------------------------------------------------------------- SuperBill Details Patient Name: Date of Service: Kevin [name], Lorne J. 06/28/2020 Medical Record Number: [id number] Patient Account Number: [id number] Date of Birth/Sex: Treating RN: 1951/05/20 (68 y.o. [name] Primary Care Provider: Marney Setting, [name] Other Clinician: Referring Provider: Treating Provider/Extender: Suzy Bouchard, [name] in Treatment: 231 Diagnosis Coding ICD-10 Codes Code Description [phone] Non-pressure chronic ulcer of right calf limited to breakdown of skin L97.221 Non-pressure chronic ulcer of left calf limited to breakdown of skin 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 of bilateral lower extremity I89.0 Lymphedema, not elsewhere classified E11.622 Type 2 diabetes mellitus with other skin ulcer E11.40 Type  2 diabetes mellitus with diabetic neuropathy, unspecified L03.116 Cellulitis of left lower limb Facility Procedures CPT4 Code: [id number] Description: (Facility Use Only) [phone] - APPLY  MULTLAY COMPRS LWR LT LEG Modifier: 59 Quantity: 1 CPT4 Code: [id number] Description: (Facility Use Only) [id number] - APPLY UNNA BOOT RT Modifier: Quantity: 1 Physician Procedures : CPT4 Code Description Modifier [id number] - WC PHYS LEVEL 4 - EST PT ICD-10 Diagnosis Description 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 skin L97.221 Non-pressure chronic ulcer of left calf limited to breakdown of  skin 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 of bilateral lower extremity I89.0 Lymphedema, not elsewhere classified Quantity: 1 Electronic Signature(s) Signed: 06/28/2020 11:21:12 AM By: Lenda Kelp PA-C Entered By: Lenda Kelp on 06/28/2020 11:21:07

## 2020-07-01 NOTE — Progress Notes (Signed)
DEMONI, GERGEN ([id number]) Visit Report for 06/21/2020 Arrival Information Details Patient Name: Date of Service: CO ANGELINA, NEECE 06/21/2020 10:15 A M Medical Record Number: [id number] Patient Account Number: [id number] Date of Birth/Sex: Treating RN: [birth year] (69 y.o. [name], [name] Primary Care Kariya Lavergne: [name], [name] Other Clinician: Referring Renae Mottley: Treating Coe Angelos/Extender: Worthy Keeler [facility] [name], [name] in Treatment: 71 Visit Information History Since Last Visit Added or deleted any medications: No Patient Arrived: Walker Any new allergies or adverse reactions: No Arrival Time: 10:15 Had a fall or experienced change in No Accompanied By: self activities of daily living that may affect Transfer Assistance: None risk of falls: Patient Identification Verified: Yes Signs or symptoms of abuse/neglect since last visito No Secondary Verification Process Completed: Yes Hospitalized since last visit: No Patient Requires Transmission-Based Precautions: No Implantable device outside of the clinic excluding No Patient Has Alerts: Yes cellular tissue based products placed in the center since last visit: Has Dressing in Place as Prescribed: Yes Pain Present Now: No Electronic Signature(s) Signed: 06/22/2020 1:30:39 PM By: Sandre Kitty Entered By: Sandre Kitty on 06/21/2020 10:17:30 -------------------------------------------------------------------------------- Compression Therapy Details Patient Name: Date of Service: [name], [name] 06/21/2020 10:15 A M Medical Record Number: [id number] Patient Account Number: [id number] Date of Birth/Sex: Treating RN: [birth year] (69 y.o. [name] Primary Care Terran Klinke: [name], [name] Other Clinician: Referring Piccola Arico: Treating Kamarie Palma/Extender: Worthy Keeler [facility] [name], [name] in Treatment: 230 Compression Therapy Performed for Wound Assessment: NonWound Condition Lymphedema - Right  Leg Performed By: Clinician [name], RN Compression Type: [name] Post Procedure Diagnosis Same as Pre-procedure Electronic Signature(s) Signed: 06/21/2020 5:37:29 PM By: Baruch Gouty RN, BSN Entered By: Baruch Gouty on 06/21/2020 11:09:34 -------------------------------------------------------------------------------- Compression Therapy Details Patient Name: Date of Service: [name], Arne J. 06/21/2020 10:15 A M Medical Record Number: [id number] Patient Account Number: [id number] Date of Birth/Sex: Treating RN: 09/10/1951 (69 y.o. [name] Primary Care Ayssa Bentivegna: [name], [name] Other Clinician: Referring Aariz Maish: Treating Lelan Cush/Extender: Worthy Keeler [facility] [name], [name] in Treatment: 230 Compression Therapy Performed for Wound Assessment: NonWound Condition Lymphedema - Left Leg Performed By: Clinician [name], RN Compression Type: Four Layer Post Procedure Diagnosis Same as Pre-procedure Electronic Signature(s) Signed: 06/21/2020 5:37:29 PM By: Baruch Gouty RN, BSN Entered By: Baruch Gouty on 06/21/2020 11:10:00 -------------------------------------------------------------------------------- Encounter Discharge Information Details Patient Name: Date of Service: [facility], Rigdon J. 06/21/2020 10:15 A M Medical Record Number: [id number] Patient Account Number: [id number] Date of Birth/Sex: Treating RN: 02/06/1951 (69 y.o. Oval Linsey Primary Care Jonessa Triplett: [name], [name] Other Clinician: Referring Jacci Ruberg: Treating Zaira Iacovelli/Extender: Worthy Keeler [facility] [name], [name] in Treatment: 230 Encounter Discharge Information Items Discharge Condition: Stable Ambulatory Status: Walker Discharge Destination: Home Transportation: Private Auto Accompanied By: self Schedule Follow-up Appointment: Yes Clinical Summary of Care: Patient Declined Electronic Signature(s) Signed: 06/30/2020 5:50:16 PM By: [name] RN Entered By:  [name] on 06/21/2020 11:48:08 -------------------------------------------------------------------------------- Lower Extremity Assessment Details Patient Name: Date of Service: JULY, NICKSON 06/21/2020 10:15 A M Medical Record Number: [id number] Patient Account Number: [id number] Date of Birth/Sex: Treating RN: 1950/11/21 (69 y.o. [name] Primary Care Lanaiya Lantry: [name], [name] Other Clinician: Referring Tempestt Silba: Treating Luetta Piazza/Extender: Worthy Keeler [facility] [name], [name] in Treatment: 230 Edema Assessment Assessed: [Left: No] [Right: No] Edema: [Left: Yes] [Right: Yes] Calf Left: Right: Point of Measurement: 25 cm From Medial Instep 38 cm 39 cm Ankle Left: Right: Point of Measurement: 9  cm From Medial Instep 26 cm 28 cm Vascular Assessment Pulses: Dorsalis Pedis Palpable: [Left:Yes] [Right:Yes] Electronic Signature(s) Signed: 06/21/2020 5:37:29 PM By: Baruch Gouty RN, BSN Entered By: Baruch Gouty on 06/21/2020 11:01:38 -------------------------------------------------------------------------------- Multi-Disciplinary Care Plan Details Patient Name: Date of Service: [facility], Hashim J. 06/21/2020 10:15 A M Medical Record Number: [id number] Patient Account Number: [id number] Date of Birth/Sex: Treating RN: 1951-02-23 (69 y.o. [name] Primary Care Alveria Mcglaughlin: [name], [name] Other Clinician: Referring Kalia Vahey: Treating Marven Veley/Extender: Worthy Keeler MCGO [name], [name] in Treatment: [phone] Active Inactive Venous Leg Ulcer Nursing Diagnoses: Actual venous Insuffiency (use after diagnosis is confirmed) Goals: Patient will maintain optimal edema control Date Initiated: 09/10/2016 Target Resolution Date: 06/28/2020 Goal Status: Active Verify adequate tissue perfusion prior to therapeutic compression application Date Initiated: 09/10/2016 Date Inactivated: 11/28/2016 Goal Status: Met Interventions: Assess peripheral edema status  every visit. Compression as ordered Provide education on venous insufficiency Notes: edema not contolled above wraps, pt not using lymoh pumps regularly Electronic Signature(s) Signed: 06/21/2020 5:37:29 PM By: Baruch Gouty RN, BSN Entered By: Baruch Gouty on 06/21/2020 11:07:33 -------------------------------------------------------------------------------- Pain Assessment Details Patient Name: Date of Service: [name], Dam J. 06/21/2020 10:15 A M Medical Record Number: [id number] Patient Account Number: [id number] Date of Birth/Sex: Treating RN: 23-Nov-1950 (69 y.o. [name] Primary Care Shayann Garbutt: [name], [name] Other Clinician: Referring Meldrick Buttery: Treating Mariame Rybolt/Extender: Worthy Keeler [facility] [name], [name] in Treatment: 230 Active Problems Location of Pain Severity and Description of Pain Patient Has Paino No Site Locations Pain Management and Medication Current Pain Management: Electronic Signature(s) Signed: 06/21/2020 5:37:29 PM By: Baruch Gouty RN, BSN Signed: 06/22/2020 1:30:39 PM By: Sandre Kitty Entered By: Sandre Kitty on 06/21/2020 10:18:10 -------------------------------------------------------------------------------- Patient/Caregiver Education Details Patient Name: Date of Service: [name], [name] 8/18/2021andnbsp10:15 A M Medical Record Number: [id number] Patient Account Number: [id number] Date of Birth/Gender: Treating RN: 07-19-1951 (69 y.o. [name] Primary Care Physician: [name], [name] Other Clinician: Referring Physician: Treating Physician/Extender: Criss Rosales, [name] in Treatment: 82 Education Assessment Education Provided To: Patient Education Topics Provided Venous: Methods: Explain/Verbal Responses: Reinforcements needed, State content correctly Electronic Signature(s) Signed: 06/21/2020 5:37:29 PM By: Baruch Gouty RN, BSN Entered By: Baruch Gouty on 06/21/2020  11:08:05 -------------------------------------------------------------------------------- Wound Assessment Details Patient Name: Date of Service: [name], Avyay J. 06/21/2020 10:15 A M Medical Record Number: [id number] Patient Account Number: [id number] Date of Birth/Sex: Treating RN: 09/10/1951 (69 y.o. [name] Primary Care Shelli Portilla: [name], [name] Other Clinician: Referring Ulisses Vondrak: Treating Ryane Konieczny/Extender: Worthy Keeler [facility] [name], [name] in Treatment: 230 Wound Status Wound Number: [id number] Primary Lymphedema Etiology: Wound Location: Right T Great oe Wound Open Wounding Event: Blister Status: Date Acquired: 05/24/2020 Comorbid Chronic sinus problems/congestion, Arrhythmia, Hypertension, Weeks Of Treatment: 4 History: Peripheral Arterial Disease, Type II Diabetes, History of Burn, Clustered Wound: No Gout, Confinement Anxiety Photos Photo Uploaded By: Mikeal Hawthorne on 06/22/2020 10:15:35 Wound Measurements Length: (cm) 2.1 Width: (cm) 1.8 Depth: (cm) 0.1 Area: (cm) 2.969 Volume: (cm) 0.297 % Reduction in Area: -1791.1% % Reduction in Volume: -1756.2% Epithelialization: Small (1-33%) Tunneling: No Undermining: No Wound Description Classification: Full Thickness Without Exposed Support Structures Wound Margin: Flat and Intact Exudate Amount: Medium Exudate Type: Serous Exudate Color: amber Foul Odor After Cleansing: No Slough/Fibrino No Wound Bed Granulation Amount: Large (67-100%) Exposed Structure Granulation Quality: Pink Fascia Exposed: No Necrotic Amount: None Present (0%) Fat Layer (Subcutaneous Tissue) Exposed: Yes Tendon Exposed: No Muscle Exposed: No  Joint Exposed: No Bone Exposed: No Electronic Signature(s) Signed: 06/21/2020 5:37:29 PM By: Baruch Gouty RN, BSN Entered By: Baruch Gouty on 06/21/2020 11:02:18 -------------------------------------------------------------------------------- Wound Assessment Details Patient  Name: Date of Service: [name], Sterlin J. 06/21/2020 10:15 A M Medical Record Number: [id number] Patient Account Number: [id number] Date of Birth/Sex: Treating RN: 10/20/1951 (69 y.o. [name] Primary Care Ramie Erman: [name], [name] Other Clinician: Referring Yuri Fana: Treating Amiylah Anastos/Extender: Worthy Keeler MCG[facility] [name], [name] in Treatment: 230 Wound Status Wound Number: [id number] Primary Lymphedema Etiology: Wound Location: Right T Second oe Wound Open Wounding Event: Gradually Appeared Status: Date Acquired: 05/24/2020 Comorbid Chronic sinus problems/congestion, Arrhythmia, Hypertension, Weeks Of Treatment: 4 History: Peripheral Arterial Disease, Type II Diabetes, History of Burn, Clustered Wound: No Gout, Confinement Anxiety Photos Photo Uploaded By: Mikeal Hawthorne on 06/22/2020 10:15:35 Wound Measurements Length: (cm) 0.8 Width: (cm) 1.5 Depth: (cm) 0.1 Area: (cm) 0.942 Volume: (cm) 0.094 % Reduction in Area: -500% % Reduction in Volume: -487.5% Epithelialization: Medium (34-66%) Tunneling: No Undermining: No Wound Description Classification: Full Thickness Without Exposed Support Structures Wound Margin: Flat and Intact Exudate Amount: Medium Exudate Type: Serous Exudate Color: amber Foul Odor After Cleansing: No Slough/Fibrino No Wound Bed Granulation Amount: Large (67-100%) Exposed Structure Granulation Quality: Pink Fascia Exposed: No Necrotic Amount: None Present (0%) Fat Layer (Subcutaneous Tissue) Exposed: Yes Tendon Exposed: No Muscle Exposed: No Joint Exposed: No Bone Exposed: No Electronic Signature(s) Signed: 06/21/2020 5:37:29 PM By: Baruch Gouty RN, BSN Entered By: Baruch Gouty on 06/21/2020 11:02:54 -------------------------------------------------------------------------------- Wound Assessment Details Patient Name: Date of Service: [name], Jaaron J. 06/21/2020 10:15 A M Medical Record Number: [id number] Patient Account  Number: [id number] Date of Birth/Sex: Treating RN: 1951-02-13 (70 y.o. [name] Primary Care Kajsa Butrum: [name], [name] Other Clinician: Referring Arlander Gillen: Treating Eryn Marandola/Extender: Worthy Keeler MCGO [name], [name] in Treatment: 230 Wound Status Wound Number: [id number] Primary Diabetic Wound/Ulcer of the Lower Extremity Etiology: Wound Location: Left, Proximal, Dorsal Foot Wound Open Wounding Event: Blister Status: Date Acquired: 06/07/2020 Comorbid Chronic sinus problems/congestion, Arrhythmia, Hypertension, Weeks Of Treatment: 2 History: Peripheral Arterial Disease, Type II Diabetes, History of Burn, Clustered Wound: No Gout, Confinement Anxiety Photos Photo Uploaded By: Mikeal Hawthorne on 06/22/2020 10:56:39 Wound Measurements Length: (cm) 0.5 Width: (cm) 0.6 Depth: (cm) 0.1 Area: (cm) 0.236 Volume: (cm) 0.024 % Reduction in Area: 74.9% % Reduction in Volume: 74.5% Epithelialization: Medium (34-66%) Tunneling: No Undermining: No Wound Description Classification: Grade 2 Wound Margin: Flat and Intact Exudate Amount: Medium Exudate Type: Serous Exudate Color: amber Foul Odor After Cleansing: No Slough/Fibrino No Wound Bed Granulation Amount: Large (67-100%) Exposed Structure Granulation Quality: Pink Fascia Exposed: No Necrotic Amount: None Present (0%) Fat Layer (Subcutaneous Tissue) Exposed: Yes Tendon Exposed: No Muscle Exposed: No Joint Exposed: No Bone Exposed: No Electronic Signature(s) Signed: 06/21/2020 5:37:29 PM By: Baruch Gouty RN, BSN Entered By: Baruch Gouty on 06/21/2020 11:03:59 -------------------------------------------------------------------------------- Wound Assessment Details Patient Name: Date of Service: [name], Noriel J. 06/21/2020 10:15 A M Medical Record Number: [id number] Patient Account Number: [id number] Date of Birth/Sex: Treating RN: 07-06-1951 (69 y.o. [name] Primary Care Deforest Maiden: [name],  [name] Other Clinician: Referring Azarian Starace: Treating Anilah Huck/Extender: Worthy Keeler MCGO [name], [name] in Treatment: 230 Wound Status Wound Number: [id number] Primary Diabetic Wound/Ulcer of the Lower Extremity Etiology: Wound Location: Left T Second oe Wound Open Wounding Event: Blister Status: Date Acquired: 06/07/2020 Comorbid Chronic sinus problems/congestion, Arrhythmia, Hypertension, Weeks Of Treatment: 2 History: Peripheral Arterial Disease, Type  II Diabetes, History of Burn, Clustered Wound: No Gout, Confinement Anxiety Photos Photo Uploaded By: Mikeal Hawthorne on 06/22/2020 10:56:20 Wound Measurements Length: (cm) Width: (cm) Depth: (cm) Area: (cm) Volume: (cm) 0 % Reduction in Area: 100% 0 % Reduction in Volume: 100% 0 Epithelialization: Large (67-100%) 0 Tunneling: No 0 Undermining: No Wound Description Classification: Grade 1 Wound Margin: Flat and Intact Exudate Amount: Small Exudate Type: Serous Exudate Color: amber Foul Odor After Cleansing: No Slough/Fibrino No Wound Bed Granulation Amount: None Present (0%) Exposed Structure Necrotic Amount: None Present (0%) Fascia Exposed: No Fat Layer (Subcutaneous Tissue) Exposed: No Tendon Exposed: No Muscle Exposed: No Joint Exposed: No Bone Exposed: No Electronic Signature(s) Signed: 06/21/2020 5:37:29 PM By: Baruch Gouty RN, BSN Entered By: Baruch Gouty on 06/21/2020 11:05:10 -------------------------------------------------------------------------------- Wound Assessment Details Patient Name: Date of Service: [name], Samul J. 06/21/2020 10:15 A M Medical Record Number: [id number] Patient Account Number: [id number] Date of Birth/Sex: Treating RN: 1951/03/30 (69 y.o. [name] Primary Care Shiva Karis: [name], [name] Other Clinician: Referring Soley Harriss: Treating Maeley Matton/Extender: Worthy Keeler MCGO [name], [name] in Treatment: 230 Wound Status Wound Number: [id number] Primary Venous  Leg Ulcer Etiology: Wound Location: Left, Lateral Lower Leg Wound Healed - Epithelialized Wounding Event: Gradually Appeared Status: Date Acquired: 06/14/2020 Comorbid Chronic sinus problems/congestion, Arrhythmia, Hypertension, Weeks Of Treatment: 1 History: Peripheral Arterial Disease, Type II Diabetes, History of Burn, Clustered Wound: No Gout, Confinement Anxiety Photos Photo Uploaded By: Mikeal Hawthorne on 06/22/2020 10:56:21 Wound Measurements Length: (cm) Width: (cm) Depth: (cm) Area: (cm) Volume: (cm) 0 % Reduction in Area: 100% 0 % Reduction in Volume: 100% 0 Epithelialization: Large (67-100%) 0 Tunneling: No 0 Undermining: No Wound Description Classification: Partial Thickness Wound Margin: Flat and Intact Exudate Amount: None Present Foul Odor After Cleansing: No Slough/Fibrino No Wound Bed Granulation Amount: None Present (0%) Exposed Structure Necrotic Amount: None Present (0%) Fascia Exposed: No Fat Layer (Subcutaneous Tissue) Exposed: No Tendon Exposed: No Muscle Exposed: No Joint Exposed: No Bone Exposed: No Electronic Signature(s) Signed: 06/21/2020 5:37:29 PM By: Baruch Gouty RN, BSN Entered By: Baruch Gouty on 06/21/2020 11:06:31 -------------------------------------------------------------------------------- [place] Details Patient Name: Date of Service: [name], Drayce J. 06/21/2020 10:15 A M Medical Record Number: [id number] Patient Account Number: [id number] Date of Birth/Sex: Treating RN: 1951/06/24 (69 y.o. [name] Primary Care Kieth Hartis: [name], [name] Other Clinician: Referring Jaiveer Panas: Treating Ahmaad Neidhardt/Extender: Worthy Keeler MCGO [name], [name] in Treatment: 230 Vital Signs Time Taken: 10:17 Temperature (F): 97.8 Height (in): 70 Pulse (bpm): 75 Weight (lbs): 380.2 Respiratory Rate (breaths/min): 20 Body Mass Index (BMI): 54.5 Blood Pressure (mmHg): 161/73 Capillary Blood Glucose (mg/dl): 171 Reference  Range: 80 - 120 mg / dl Electronic Signature(s) Signed: 06/22/2020 1:30:39 PM By: Sandre Kitty Entered By: Sandre Kitty on 06/21/2020 10:18:02

## 2020-07-05 ENCOUNTER — Encounter (HOSPITAL_BASED_OUTPATIENT_CLINIC_OR_DEPARTMENT_OTHER): Payer: Medicare Other | Attending: Physician Assistant | Admitting: Physician Assistant

## 2020-07-05 ENCOUNTER — Other Ambulatory Visit: Payer: Self-pay

## 2020-07-05 DIAGNOSIS — I890 Lymphedema, not elsewhere classified: Secondary | ICD-10-CM | POA: Insufficient documentation

## 2020-07-05 DIAGNOSIS — R29898 Other symptoms and signs involving the musculoskeletal system: Secondary | ICD-10-CM | POA: Diagnosis not present

## 2020-07-05 DIAGNOSIS — E11622 Type 2 diabetes mellitus with other skin ulcer: Secondary | ICD-10-CM | POA: Diagnosis not present

## 2020-07-05 DIAGNOSIS — I87333 Chronic venous hypertension (idiopathic) with ulcer and inflammation of bilateral lower extremity: Secondary | ICD-10-CM | POA: Insufficient documentation

## 2020-07-05 DIAGNOSIS — L97221 Non-pressure chronic ulcer of left calf limited to breakdown of skin: Secondary | ICD-10-CM | POA: Insufficient documentation

## 2020-07-05 DIAGNOSIS — E1140 Type 2 diabetes mellitus with diabetic neuropathy, unspecified: Secondary | ICD-10-CM | POA: Diagnosis not present

## 2020-07-05 DIAGNOSIS — L03116 Cellulitis of left lower limb: Secondary | ICD-10-CM | POA: Diagnosis not present

## 2020-07-05 DIAGNOSIS — L97211 Non-pressure chronic ulcer of right calf limited to breakdown of skin: Secondary | ICD-10-CM | POA: Diagnosis not present
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
## 2020-07-07 ENCOUNTER — Other Ambulatory Visit: Payer: Self-pay

## 2020-07-08 NOTE — Progress Notes (Signed)
MELFORD, TULLIER ([id number]) Visit Report for 07/05/2020 SuperBill Details Patient Name: Date of Service: [name], [name] 07/05/2020 Medical Record Number: [id number] Patient Account Number: [id number] Date of Birth/Sex: Treating RN: 09/18/1951 (69 y.o. 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

## 2020-07-12 ENCOUNTER — Encounter: Payer: Self-pay | Admitting: Family Medicine

## 2020-07-12 ENCOUNTER — Ambulatory Visit (INDEPENDENT_AMBULATORY_CARE_PROVIDER_SITE_OTHER): Payer: Medicare Other | Admitting: Family Medicine

## 2020-07-12 ENCOUNTER — Encounter (HOSPITAL_BASED_OUTPATIENT_CLINIC_OR_DEPARTMENT_OTHER): Payer: Medicare Other | Admitting: Physician Assistant

## 2020-07-12 ENCOUNTER — Other Ambulatory Visit: Payer: Self-pay

## 2020-07-12 VITALS — BP 158/63 | HR 68 | Temp 98.2°F | Resp 16 | Wt 380.6 lb

## 2020-07-12 DIAGNOSIS — L03119 Cellulitis of unspecified part of limb: Secondary | ICD-10-CM | POA: Diagnosis not present

## 2020-07-12 DIAGNOSIS — E7800 Pure hypercholesterolemia, unspecified: Secondary | ICD-10-CM

## 2020-07-12 DIAGNOSIS — L97522 Non-pressure chronic ulcer of other part of left foot with fat layer exposed: Secondary | ICD-10-CM | POA: Diagnosis not present

## 2020-07-12 DIAGNOSIS — L97211 Non-pressure chronic ulcer of right calf limited to breakdown of skin: Secondary | ICD-10-CM | POA: Diagnosis not present

## 2020-07-12 DIAGNOSIS — L97221 Non-pressure chronic ulcer of left calf limited to breakdown of skin: Secondary | ICD-10-CM | POA: Diagnosis not present

## 2020-07-12 DIAGNOSIS — L97518 Non-pressure chronic ulcer of other part of right foot with other specified severity: Secondary | ICD-10-CM | POA: Diagnosis not present

## 2020-07-12 DIAGNOSIS — L03116 Cellulitis of left lower limb: Secondary | ICD-10-CM | POA: Diagnosis not present

## 2020-07-12 DIAGNOSIS — E1140 Type 2 diabetes mellitus with diabetic neuropathy, unspecified: Secondary | ICD-10-CM | POA: Diagnosis not present

## 2020-07-12 DIAGNOSIS — E11622 Type 2 diabetes mellitus with other skin ulcer: Secondary | ICD-10-CM | POA: Diagnosis not present

## 2020-07-12 DIAGNOSIS — Z7901 Long term (current) use of anticoagulants: Secondary | ICD-10-CM | POA: Diagnosis not present

## 2020-07-12 DIAGNOSIS — Z125 Encounter for screening for malignant neoplasm of prostate: Secondary | ICD-10-CM

## 2020-07-12 DIAGNOSIS — I87333 Chronic venous hypertension (idiopathic) with ulcer and inflammation of bilateral lower extremity: Secondary | ICD-10-CM | POA: Diagnosis not present

## 2020-07-12 DIAGNOSIS — Z23 Encounter for immunization: Secondary | ICD-10-CM

## 2020-07-12 DIAGNOSIS — E118 Type 2 diabetes mellitus with unspecified complications: Secondary | ICD-10-CM

## 2020-07-12 DIAGNOSIS — N2889 Other specified disorders of kidney and ureter: Secondary | ICD-10-CM

## 2020-07-12 DIAGNOSIS — I890 Lymphedema, not elsewhere classified: Secondary | ICD-10-CM | POA: Diagnosis not present

## 2020-07-12 DIAGNOSIS — I10 Essential (primary) hypertension: Secondary | ICD-10-CM

## 2020-07-12 DIAGNOSIS — N183 Chronic kidney disease, stage 3 unspecified: Secondary | ICD-10-CM | POA: Diagnosis not present

## 2020-07-12 LAB — COMPREHENSIVE METABOLIC PANEL
ALT: 10 U/L (ref 0–53)
AST: 14 U/L (ref 0–37)
Albumin: 3.7 g/dL (ref 3.5–5.2)
Alkaline Phosphatase: 70 U/L (ref 39–117)
BUN: 17 mg/dL (ref 6–23)
CO2: 29 mEq/L (ref 19–32)
Calcium: 8.6 mg/dL (ref 8.4–10.5)
Chloride: 100 mEq/L (ref 96–112)
Creatinine, Ser: 1.27 mg/dL (ref 0.40–1.50)
GFR: 56.19 mL/min — ABNORMAL LOW (ref >60.00)
Glucose, Bld: 115 mg/dL — ABNORMAL HIGH (ref 70–99)
Potassium: 4.3 mEq/L (ref 3.5–5.1)
Sodium: 138 mEq/L (ref 135–145)
Total Bilirubin: 1.1 mg/dL (ref 0.2–1.2)
Total Protein: 6.6 g/dL (ref 6.0–8.3)

## 2020-07-12 LAB — LIPID PANEL
Cholesterol: 97 mg/dL (ref 0–200)
HDL: 36.5 mg/dL — ABNORMAL LOW (ref >39.00)
LDL Cholesterol: 49 mg/dL (ref 0–99)
NonHDL: 60.83
Total CHOL/HDL Ratio: 3
Triglycerides: 61 mg/dL (ref 0.0–149.0)
VLDL: 12.2 mg/dL (ref 0.0–40.0)

## 2020-07-12 LAB — CBC
HCT: 38.8 % — ABNORMAL LOW (ref 39.0–52.0)
Hemoglobin: 12.7 g/dL — ABNORMAL LOW (ref 13.0–17.0)
MCHC: 32.7 g/dL (ref 30.0–36.0)
MCV: 88.9 fl (ref 78.0–100.0)
Platelets: 208 10*3/uL (ref 150.0–400.0)
RBC: 4.37 Mil/uL (ref 4.22–5.81)
RDW: 15.8 % — ABNORMAL HIGH (ref 11.5–15.5)
WBC: 7.7 10*3/uL (ref 4.0–10.5)

## 2020-07-12 LAB — PSA: PSA: 0.35 ng/mL (ref 0.10–4.00)

## 2020-07-12 LAB — HEMOGLOBIN A1C: Hgb A1c MFr Bld: 7.5 % — ABNORMAL HIGH (ref 4.6–6.5)

## 2020-07-12 NOTE — Progress Notes (Addendum)
[name], [name] ([id number]) Visit Report for 07/12/2020 Chief Complaint Document Details Patient Name: Date of Service: Kevin ELUZER, [name] 07/12/2020 11:00 A M Medical Record Number: [id number] Patient Account Number: [id number] Date of Birth/Sex: Treating RN: [birth year] (69 y.o. [name] Primary Care Provider: Marney Setting, [name] Other Clinician: Referring Provider: Treating Provider/Extender: Lenda Kelp [facility] [name], [name] in Treatment: [phone] Information Obtained from: Patient Chief Complaint patient is here for evaluation venous/lymphedema weeping Electronic Signature(s) Signed: 07/12/2020 11:13:02 AM By: Lenda Kelp PA-C Entered By: Lenda Kelp on 07/12/2020 11:13:01 -------------------------------------------------------------------------------- HPI Details Patient Name: Date of Service: [name], [name] 07/12/2020 11:00 A M Medical Record Number: [id number] Patient Account Number: [id number] Date of Birth/Sex: Treating RN: [birth year] (69 y.o. [name] Primary Care Provider: Marney Setting, [name] Other Clinician: Referring Provider: Treating Provider/Extender: Lenda Kelp [facility] [name], [name] in Treatment: 233 History of Present Illness HPI Description: Referred by PCP for consultation. Patient has long standing history of BLE venous stasis, no prior ulcerations. At beginning of month, developed cellulitis and weeping. Received IM Rocephin followed by Keflex and resolved. Wears compression stocking, appr 6 months old. Not sure strength. No present drainage. 01/22/16 this is a patient who is a type II diabetic on insulin. 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 He tells me he religiously wears pressure stockings of uncertain strength. He was here with weeping edema about 8 months ago but did not have an open wound. Roughly a month ago he had a reopening on his bilateral legs. He is been using bandages and Neosporin. He  does not complain of pain. 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 Last BUN/creatinine I have is from 11/20/15 at 13 and 1.0 respectively 01/29/16; patient arrives today having tolerated the Profore wrap. He brought in his stockings and these are 18 mmHg stockings he bought from [employer]. The compression here is likely inadequate. He does not complain of pain or excessive drainage she has no systemic symptoms. The wound on the right looks improved as does the one on the left although one on the left is more substantial with still tissue at risk below the actual wound area on the bilateral posterior calf 02/05/16; patient arrives with poor edema control. He states that we did put a 4 layer compression on it last week. No weight appear 5 this. 02/12/16; the area on the posterior right Has healed. The left Has a substantial wound that has necrotic surface eschar that requires a debridement with a curette. 02/16/16;the patient called or a Nurse visit secondary to increased swelling. He had been in earlier in the week with his right leg healed. He was transitioned to is on pressure stocking on the right leg with the only open wound on the left, a substantial area on the left posterior calf. Note he has a history of severe lower extremity edema, he has a history of chronic atrial fibrillation but not heart failure per my notes but I'll need to research this. He is not complaining of chest pain shortness of breath or orthopnea. The intake nurse noted blisters on the previously closed right leg 02/19/16; this is the patient's regular visit day. I see him on Friday with escalating edema new wounds on the right leg and clear signs of at least right ventricular heart failure. I increased his Lasix to  40 twice a day. He is returning currently in follow-up. States he is noticed a decrease in that the  edema 02/26/16 patient's legs have much less edema. There is nothing really open on the right leg. The left leg has improved condition of the large superficial wound on the posterior left leg 03/04/16; edema control is very much better. The patient's right leg wounds have healed. On the left leg he continues to have severe venous inflammation on the posterior aspect of the left leg. There is no tenderness and I don't think any of this is cellulitis. 03/11/16; patient's right leg is married healed and he is in his own stocking. The patient's left leg has deteriorated somewhat. There is a lot of erythema around the wound on the posterior left leg. There is also a significant rim of erythema posteriorly just above where the wrap would've ended there is a new wound in this location and a lot of tenderness. Can't rule out cellulitis in this area. 03/15/16; patient's right leg remains healed and he is in his own stocking. The patient's left leg is much better than last review. His major wound on the posterior aspect of his left Is almost fully epithelialized. He has 3 small injuries from the wraps. Really. Erythema seems a lot better on antibiotics 03/18/16; right leg remains healed and he is in his own stocking. The patient's left leg is much better. The area on the posterior aspect of the left calf is fully epithelialized. His 3 small injuries which were wrap injuries on the left are improved only one seems still open his erythema has resolved 03/25/16; patient's right leg remains healed and he is in his own stocking. There is no open area today on the left leg posterior leg is completely closed up. His wrap injuries at the superior aspect of his leg are also resolved. He looks as though he has some irritation on the dorsal ankle but this is fully epithelialized without evidence of infection. 03/28/16; we discharged this patient on Monday. Transitioned him into his own stocking. There were problems almost  immediately with uncontrolled swelling weeping edema multiple some of which have opened. He does not feel systemically unwell in particular no chest pain no shortness of breath and he does not feel 04/08/16; the edema is under better control with the Profore light wrap but he still has pitting edema. There is one large wound anteriorly 2 on the medial aspect of his left leg and 3 small areas on the superior posterior calf. Drainage is not excessive he is tolerating a Profore light well 04/15/16; put a Profore wrap on him last week. This is controlled is edema however he had a lot of pain on his left anterior foot most of his wounds are healed 04/22/16 once again the patient has denuded areas on the left anterior foot which he states are because his wrap slips up word. He saw his primary physician today is on Lasix 40 twice a day and states that he his weight is down 20 pounds over the last 3 months. 04/29/16: Much improved. left anterior foot much improved. He is now on Lasix 80 mg per day. Much improved edema control 05/06/16; I was hoping to be able to discharge him today however once again he has blisters at a low level of where the compression was placed last week mostly on his left lateral but also his left medial leg and a small area on the anterior part of the left foot. 05/09/16;  apparently the patient went home after his appointment on 7/4 later in the evening developing pain in his upper medial thigh together with subjective fever and chills although his temperature was not taken. The pain was so intense he felt he would probably have to call 911. However he then remembered that he had leftover doxycycline from a previous round of antibiotics and took these. By the next morning he felt a lot better. He called and spoke to one of our nurses and I approved doxycycline over the phone thinking that this was in relation to the wounds we had previously seen although they were definitely were not. The  patient feels a lot better old fever no chills he is still working. Blood sugars are reasonably controlled 05/13/16; patient is back in for review of his cellulitis on his anterior medial upper thigh. He is taking doxycycline this is a lot better. Culture I did of the nodular area on the dorsal aspect of his foot grew MRSA this also looks a lot better. 05/20/16; the patient is cellulitis on the medial upper thigh has resolved. All of his wound areas including the left anterior foot, areas on the medial aspect of the left calf and the lateral aspect of the calf at all resolved. He has a new blister on the left dorsal foot at the level of the fourth toe this was excised. No evidence of infection 05/27/16; patient continues to complain weeping edema. He has new blisterlike wounds on the left anterior lateral and posterior lateral calf at the top of his wrap levels. The area on his left anterior foot appears better. He is not complaining of fever, pain or pruritus in his feet. 05/30/16; the patient's blisters on his left anterior leg posterior calf all look improved. He did not increase the Lasix 100 mg as I suggested because he was going to run out of his 40 mg tablets. He is still having weeping edema of his toes 06/03/16; I renewed his Lasix at 80 mg once a day as he was about to run out when I last saw him. He is on 80 mg of Lasix now. I have asked him to cut down on the excessive amount of water he was drinking and asked him to drink according to his thirst mechanisms 06/12/2016 -- was seen 2 days ago and was supposed to wear his compression stockings at home but he is developed lymphedema and superficial blisters on the left lower extremity and hence came in for a review 06/24/16; the remaining wound is on his left anterior leg. He still has edema coming from between his toes. There is lymphedema here however his edema is generally better than when I last saw this. He has a history of atrial fibrillation  but does not have a known history of congestive heart failure nevertheless I think he probably has this at least on a diastolic basis. 07/01/16 I reviewed his echocardiogram from January 2017. This was essentially normal. He did not have LVH, EF of 55-60%. His right ventricular function was normal although he did have trivial tricuspid and pulmonic regurgitation. This is not audible on exam however. I increased his Lasix to do massive edema in his legs well above his knees I think in early July. He was also drinking an excessive amount of water at the time. 07/15/16; missed his appointment last week because of the Labor Day holiday on Monday. He could not get another appointment later in the week. Started to feel the wrap  digging in superiorly so we remove the top half and the bottom half of his wrap. He has extensive erythema and blistering superiorly in the left leg. Very tender. Very swollen. Edema in his foot with leaking edema fluid. He has not been systemically unwell 07/22/16; the area on the left leg laterally required some debridement. The medial wounds look more stable. His wrap injury wounds appear to have healed. Edema and his foot is better, weeping edema is also better. He tells me he is meeting with the supplier of the external compression pumps at work 08/05/16; the patient was on vacation last week in [facility]. His wrap is been on for an extended period of time. Also over the weekend he developed an extensive area of tender erythema across his anterior medial thigh. He took to doxycycline yesterday that he had leftover from a previous prescription. The patient complains of weeping edema coming out of his toes 08/08/16; I saw this patient on 10/2. He was tender across his anterior thigh. I put him on doxycycline. He returns today in follow-up. He does not have any open wounds on his lower leg, he still has edema weeping into his toes. 08/12/16; patient was seen back urgently today to  follow-up for his extensive left thigh cellulitis/erysipelas. He comes back with a lot less swelling and erythema pain is much better. I believe I gave him Augmentin and Cipro. His wrap was cut down as he stated a roll down his legs. He developed blistering above the level of the wrap that remained. He has 2 open blisters and 1 intact. 08/19/16; patient is been doing his primary doctor who is increased his Lasix from 40-80 once a day or 80 already has less edema. Cellulitis has remained improved in the left thigh. 2 open areas on the posterior left calf 08/26/16; he returns today having new open blisters on the anterior part of his left leg. He has his compression pumps but is not yet been shown how to use some vital representative from the supplier. 09/02/16 patient returns today with no open wounds on the left leg. Some maceration in his plantar toes 09/10/2016 -- Dr. Leanord Hawking had recently discharged him on 09/02/2016 and he has come right back with redness swelling and some open ulcers on his left lower extremity. He says this was caused by trying to apply his compression stockings and he's been unable to use this and has not been able to use his lymphedema pumps. He had some doxycycline leftover and he has started on this a few days ago. 09/16/16; there are no open wounds on his leg on the left and no evidence of cellulitis. 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 He does not complain of symptoms here. I am not clear use using his external compression pumps. 09/23/16; I have not seen this patient in 2 weeks. He canceled his appointment 10 days ago as he was going on vacation. He tells me that on Monday he noticed a large area on his posterior left leg which is been draining copiously and is reopened into a large wound. He is been using ABDs and the external part of his juxtalite, according to our nurse this was not on properly. 10/07/16;  Still a substantial area on the posterior left leg. Using silver alginate 10/14/16; in general better although there is still open area which looks healthy. Still using silver alginate. He reminds me that this happen before he  left for [facility]. T oday while he was showering in the morning. He had been using his juxtalite's 10/21/16; the area on his posterior left leg is fully epithelialized. However he arrives today with a large area of tender erythema in his medial and posterior left thigh just above the knee. I have marked the area. Once again he is reluctant to consider hospitalization. I treated him with oral antibiotics in the past for a similar situation with resolution I think with doxycycline however this area it seems more extensive to me. He is not complaining of fever but does have chills and says states he is thirsty. His blood sugar today was in the 140s at home 10/25/16 the area on his posterior left leg is fully epithelialized although there is still some weeping edema. The large area of tenderness and erythema in his medial and posterior left thigh is a lot less tender although there is still a lot of swelling in this thigh. He states he feels a lot better. He is on doxycycline and Augmentin that I started last week. This will continued until Tuesday, December 26. I have ordered a duplex ultrasound of the left thigh rule out DVT whether there is an abscess something that would need to be drained I would also like to know. 11/01/16; he still has weeping edema from a not fully epithelialized area on his left posterior calf. Most of the rest of this looks a lot better. He has completed his antibiotics. His thigh is a lot better. Duplex ultrasound did not show a DVT in the thigh 11/08/16; he comes in today with more Denuded surface epithelium from the posterior aspect of his calf. There is no real evidence of cellulitis. The superior aspect of his wrap appears to have put quite an  indentation in his leg just below the knee and this may have contributed. He does not complain of pain or fever. We have been using silver alginate as the primary dressing. The area of cellulitis in the right thigh has totally resolved. He has been using his compression stockings once a week 11/15/16; the patient arrives today with more loss of epithelium from the posterior aspect of his left calf. He now has a fairly substantial wound in this area. The reason behind this deterioration isn't exactly clear although his edema is not well controlled. He states he feels he is generally more swollen systemically. He is not complaining of chest pain shortness of breath fever. T me he has an appointment with his primary physician in early February. He is on 80 mg of oral ells Lasix a day. He claims compliance with the external compression pumps. He is not having any pain in his legs similar to what he has with his recurrent cellulitis 11/22/16; the patient arrives a follow-up of his large area on his left lateral calf. This looks somewhat better today. He came in earlier in the week for a dressing change since I saw him a week ago. He is not complaining of any pain no shortness of breath no chest pain 11/28/16; the patient arrives for follow-up of his large area on the left lateral calf this does not look better. In fact it is larger weeping edema. The surface of the wound does not look too bad. We have been using silver alginate although I'm not certain that this is a dressing issue. 12/05/16; again the patient follows up for a large wound on the left lateral and left posterior calf this does not  look better. There continues to be weeping edema necrotic surface tissue. More worrisome than this once again there is erythema below the wound involving the distal Achilles and heel suggestive of cellulitis. He is on his feet working most of the day of this is not going well. We are changing his dressing twice a week to  facilitate the drainage. 12/12/16; not much change in the overall dimensions of the large area on the left posterior calf. This is very inflamed looking. I gave him an. Doxycycline last week does not really seem to have helped. He found the wrap very painful indeed it seems to of dog into his legs superiorly and perhaps around the heel. He came in early today because the drainage had soaked through his dressings. 12/19/16- patient arrives for follow-up evaluation of his left lower extremity ulcers. He states that he is using his lymphedema pumps once daily when there is "no drainage". He admits to not using his lipedema pumps while under current treatment. His blood sugars have been consistently between 150-200. 12/26/16; the patient is not using his compression pumps at home because of the wetness on his feet. I've advised him that I think it's important for him to use this daily. He finds his feet too wet, he can put a plastic bag over his legs while he is in the pumps. Otherwise I think will be in a vicious circle. We are using silver alginate to the major area on his left posterior calf 01/02/17; the patient's posterior left leg has further of all into 3 open wounds. All of them covered with a necrotic surface. He claims to be using his compression pumps once a day. His edema control is marginal. Continue with silver alginate 01/10/17; the patient's left posterior leg actually looks somewhat better. There is less edema, less erythema. Still has 3 open areas covered with a necrotic surface requiring debridement. He claims to be using his compression pumps once a day his edema control is better 01/17/17; the patient's left posterior calf look better last week when I saw him and his wrap was changed 2 days ago. He has noted increasing pain in the left heel and arrives today with much larger wounds extensive erythema extending down into the entire heel area especially tender medially. He is not  systemically unwell CBGs have been controlled no fever. Our intake nurse showed me limegreen drainage on his AVD pads. 01/24/17; his usual this patient responds nicely to antibiotics last week giving him Levaquin for presumed Pseudomonas. The whole entire posterior part of his leg is much better much less inflamed and in the case of his Achilles heel area much less tender. He has also had some epithelialization posteriorly there are still open areas here and still draining but overall considerably better 01/31/17- He has continue to tolerate the compression wraps. he states that he continues to use the lymphedema pumps daily, and can increase to twice daily on the weekends. He is voicing no complaints or concerns regarding his LLE ulcers 02/07/17-he is here for follow-up evaluation. He states that he noted some erythema to the left medial and anterior thigh, which he states is new as of yesterday. He is concerned about recurrent cellulitis. He states his blood sugars have been slightly elevated, this morning in the 180s 02/14/17; he is here for follow-up evaluation. When he was last here there was erythema superiorly from his posterior wound in his anterior thigh. He was prescribed Levaquin however a culture of the wound surface  grew MRSA over the phone I changed him to doxycycline on Monday and things seem to be a lot better. 02/24/17; patient missed his appointment on Friday therefore we changed his nurse visit into a physician visit today. Still using silver alginate on the large area of the posterior left thigh. He isn't new area on the dorsal left second toe 03/03/17; actually better today although he admits he has not used his external compression pumps in the last 2 days or so because of work responsibilities over the weekend. 03/10/17; continued improvement. External compression pumps once a day almost all of his wounds have closed on the posterior left calf. Better edema control 03/17/17; in general  improved. He still has 3 small open areas on the lateral aspect of his left leg however most of the area on the posterior part of his leg is epithelialized. He has better edema control. He has an ABD pad under his stocking on the right anterior lower leg although he did not let us look at that today. 03/24/17; patient arrives back in clinic today with no open areas however there are areas on the posterior left calf and anterior left calf that are less than 100% epithelialized. His edema is well controlled in the left lower leg. There is some pitting edema probably lymphedema in the left upper thigh. He uses compression pumps at home once per day. I tried to get him to do this twice a day although he is very reticent. 04/01/2017 -- for the last 2 days he's had significant redness, tenderness and weeping and came in for an urgent visit today. 04/07/17; patient still has 6 more days of doxycycline. He was seen by Dr. Meyer Russel last Wednesday for cellulitis involving the posterior aspect, lateral aspect of his Involving his heel. For the most part he is better there is less erythema and less weeping. He has been on his feet for 12 hours 2 over the weekend. Using his compression pumps once a day 04/14/17 arrives today with continued improvement. Only one area on the posterior left calf that is not fully epithelialized. He has intense bilateral venous inflammation associated with his chronic venous insufficiency disease and secondary lymphedema. We have been using silver alginate to the left posterior calf wound In passing he tells [place] today that the right leg but we have not seen in quite some time has an open area on it but he doesn't want [place] to look at this today states he will show this to [place] next week. 04/21/17; there is no open area on his left leg although he still reports some weeping edema. He showed [place] his right leg today which is the first time we've seen this leg in a long time. He has a large area of  open wound on the right leg anteriorly healthy granulation. Quite a bit of swelling in the right leg and some degree of venous inflammation. He told us about the right leg in passing last week but states that deterioration in the right leg really only happened over the weekend 04/28/17; there is no open area on the left leg although there is an irritated part on the posterior which is like a wrap injury. The wound on the right leg which was new from last week at least to [place] is a lot better. 05/05/17; still no open area on the left leg. Patient is using his new compression stocking which seems to be doing a good job of controlling the edema. He states he  is using his compression pumps once per day. The right leg still has an open wound although it is better in terms of surface area. Required debridement. A lot of pain in the posterior right Achilles marked tenderness. Usually this type of presentation this patient gives concern for an active cellulitis 05/12/17; patient arrives today with his major wound from last week on the right lateral leg somewhat better. Still requiring debridement. He was using his compression stocking on the left leg however that is reopened with superficial wounds anteriorly he did not have an open wound on this leg previously. He is still using his juxta light's once daily at night. He cannot find the time to do this in the morning as he has to be at work by 7 AM 05/19/17; right lateral leg wound looks improved. No debridement required. The concerning area is on the left posterior leg which appears to almost have a subcutaneous hemorrhagic component to it. We've been using silver alginate to all the wounds 05/26/17; the right lateral leg wound continues to look improved. However the area on the left posterior calf is a tightly adherent surface. [place] using silver alginate. Because of the weeping edema in his legs there is very little good alternatives. 06/02/17; the patient left  here last week looking quite good. Major wound on the left posterior calf and a small one on the right lateral calf. Both of these look satisfactory. He tells me that by Wednesday he had noted increased pain in the left leg and drainage. He called on Thursday and Friday to get an appointment here but we were blocked. He did not go to urgent care or his primary physician. He thinks he had a fever on Thursday but did not actually take his temperature. He has not been using his compression pumps on the left leg because of pain. I advised him to go to the emergency room today for IV antibiotics for stents of left leg cellulitis but he has refused I have asked him to take 2 days off work to keep his leg elevated and he has refused this as well. In view of this I'm going to call him and Augmentin and doxycycline. 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 However after a detailed discussion regarding the risk benefits and alternatives he refuses to get admitted to the hospital. With no other recourse I will continue him on oral antibiotics as before and hopefully he'll have his infectious disease consultation this week. 06/16/2017 -- the patient was seen today by the nurse practitioner at infectious disease Ms. Dixon. 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 He has also been advise Lotrimin cream locally. she also had wise IV antibiotics for his condition if it flares up 06/23/17; patient arrives today with drainage bilaterally although the remaining wound on the left posterior calf after  cleaning up today "highlighter yellow drainage" did not look too bad. Unfortunately he has had breakdown on the right anterior leg [previously this leg had not been open and he is using a black stocking] he went  to see infectious disease and is been put on clindamycin 150 mg daily, I did not verify the dose although I'm not familiar with using clindamycin in this dosing range, perhaps for prophylaxisoo 06/27/17; I brought this patient back today to follow-up on the wound deterioration on the right lower leg together with surrounding cellulitis. I started him on doxycycline 4 days ago. This area looks better however he comes in today with intense cellulitis on the medial part of his left thigh. This is not have a wound in this area. Extremely tender. We've been using silver alginate to the wounds on the right lower leg left lower leg with bilateral 4 layer compression he is using his external compression pumps once a day 07/04/17; patient's left medial thigh cellulitis looks better. He has not been using his compression pumps as his insert said it was contraindicated with cellulitis. His right leg continues to make improvements all the wounds are still open. We only have one remaining wound on the left posterior calf. Using silver alginate to all open areas. He is on doxycycline which I started a week ago and should be finishing I gave him Augmentin after Thursday's visit for the severe cellulitis on the left medial thigh which fortunately looks better 07/14/17; the patient's left medial thigh cellulitis has resolved. The cellulitis in his right lower calf on the right also looks better. All of his wounds are stable to improved we've been using silver alginate he has completed the antibiotics I have given him. He has clindamycin 150 mg once a day prescribed by infectious disease for prophylaxis, I've advised him to start this now. We have been using bilateral Unna boots over silver alginate to the wound  areas 07/21/17; the patient is been to see infectious disease who noted his recurrent problems with cellulitis. He was not able to tolerate prophylactic clindamycin therefore he is on amoxicillin 500 twice a day. He also had a second daily dose of Lasix added By Dr. Oneta Rack but he is not taking this. Nor is he being completely compliant with his compression pumps a especially not this week. He has 2 remaining wounds one on the right posterior lateral lower leg and one on the left posterior medial lower leg. 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 The patient has Unna boots bilaterally. 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 He has bilateral Unna boots for compression and silver alginate to his wounds. Arrives today with his legs looking as good as I have seen him in quite some time. Not surprisingly his wounds look better as well with improvement on the right lateral leg venous insufficiency wound and also the left medial leg. He is still using the compression pumps once a day 08/11/17; both legs appear to be doing better wounds on the right lateral and left medial legs look better. Skin on the right leg quite good. He is been using silver alginate as the primary dressing. I'm going to use Anasept gel calcium alginate and maintain all the secondary dressings 08/18/17; the patient continues to actually do quite well. The area on his right lateral leg is just about closed the left medial also looks better although it is still moist in this area. His edema is well controlled we have been using Anasept gel  with calcium alginate and the usual secondary dressings, 4 layer compression and once daily use of his compression pumps "always been able to manage 09/01/17; the patient  continues to do reasonably well in spite of his trip to [place]. The area on the right lateral leg is epithelialized. Left is much better but still open. He has more edema and more chronic erythema on the left leg [venous inflammation] 09/08/17; he arrives today with no open wound on the right lateral leg and decently controlled edema. Unfortunately his left leg is not nearly as in his good situation as last week.he apparently had increasing edema starting on Saturday. He edema soaked through into his foot so used a plastic bag to walk around his home. The area on the medial right leg which was his open area is about the same however he has lost surface epithelium on the left lateral which is new and he has significant pain in the Achilles area of the left foot. He is already on amoxicillin chronically for prophylaxis of cellulitis in the left leg 09/15/17; he is completed a week of doxycycline and the cellulitis in the left posterior leg and Achilles area is as usual improved. He still has a lot of edema and fluid soaking through his dressings. There is no open wound on the right leg. He saw infectious disease NP today 09/22/17;As usual 1 we transition him from our compression wraps to his stockings things did not go well. He has several small open areas on the right leg. He states this was caused by the compression wrap on his skin although he did not wear this with the stockings over them. He has several superficial areas on the left leg medially laterally posteriorly. He does not have any evidence of active cellulitis especially involving the left Achilles The patient is traveling from [facility] Saturday going to [facility]. He states he isn't attempting to get an appointment with a heel objects wound center there to change his dressings. I am not completely certain whether this will work 10/06/17; the patient came in on Friday for a nurse visit and the nurse reported that his legs actually look  quite good. He arrives in clinic today for his regular follow-up visit. He has a new wound on his left third toe over the PIP probably caused by friction with his footwear. He has small areas on the left leg and a very superficial but epithelialized area on the right anterior lateral lower leg. Other than that his legs look as good as I've seen him in quite some time. We have been using silver alginate Review of systems; no chest pain no shortness of breath other than this a 10 point review of systems negative 10/20/17; seen by Dr. Meyer Russel last week. He had taken some antibiotics [doxycycline] that he had left over. Dr. Meyer Russel thought he had candida infection and declined to give him further antibiotics. He has a small wound remaining on the right lateral leg several areas on the left leg including a larger area on the left posterior several left medial and anterior and a small wound on the left lateral. The area on the left dorsal third toe looks a lot better. ROS; Gen.; no fever, respiratory no cough no sputum Cardiac no chest pain other than this 10 point review of system is negative 10/30/17; patient arrives today having fallen in the bathtub 3 days ago. It took him a while to get up. He has pain and maceration in the  wounds on his left leg which have deteriorated. He has not been using his pumps he also has some maceration on the right lateral leg. 11/03/17; patient continues to have weeping edema especially in the left leg. This saturates his dressings which were just put on on 12/27. As usual the doxycycline seems to take care of the cellulitis on his lower leg. He is not complaining of fever, chills, or other systemic symptoms. He states his leg feels a lot better on the doxycycline I gave him empirically. He also apparently gets injections at his primary doctor's officeo Rocephin for cellulitis prophylaxis. I didn't ask him about his compression pump compliance today I think that's probably  marginal. Arrives in the clinic with all of his dressings primary and secondary macerated full of fluid and he has bilateral edema 11/10/17; the patient's right leg looks some better although there is still a cluster of wounds on the right lateral. The left leg is inflamed with almost circumferential skin loss medially to laterally although we are still maintaining anteriorly. He does not have overt cellulitis there is a lot of drainage. He is not using compression pumps. We have been using silver alginate to the wound areas, there are not a lot of options here 11/17/17; the patient's right leg continues to be stable although there is still open wounds, better than last week. The inflammation in the left leg is better. Still loss of surface layer epithelium especially posteriorly. There is no overt cellulitis in the amount of edema and his left leg is really quite good, tells me he is using his compression pumps once a day. 11/24/17; patient's right leg has a small superficial wound laterally this continues to improve. The inflammation in the left leg is still improving however we have continuous surface layer epithelial loss posteriorly. There is no overt cellulitis in the amount of edema in both legs is really quite good. He states he is using his compression pumps on the left leg once a day for 5 out of 7 days 12/01/17; very small superficial areas on the right lateral leg continue to improve. Edema control in both legs is better today. He has continued loss of surface epithelialization and left posterior calf although I think this is better. We have been using silver alginate with large number of absorptive secondary dressings 4 layer on the left Unna boot on the right at his request. He tells me he is using his compression pumps once a day 12/08/17; he has no open area on the right leg is edema control is good here. On the left leg however he has marked erythema and tenderness breakdown of skin. He has  what appears to be a wrap injury just distal to the popliteal fossa. 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 There was swelling erythema and breakdown in the right leg. He has much worse skin breakdown in the left leg as well multiple open areas medially and posteriorly as well as laterally. He tells me he has been using his compression pumps but tells me he feels that the drainage out of his leg is worse when he uses a compression pumps. T be fair to him he is been saying this o for a  while however I don't know that I have really been listening to this. I wonder if the compression pumps are working properly 12/22/17;. Once again he arrives with severe erythema, weeping edema from the left greater than right leg. Noncompliance with compression pumps. New this visit he is complaining of pain on the lateral aspect of the right leg and the medial aspect of his right thigh. He apparently saw his cardiologist Dr. [name] who was ordered an echocardiogram area and I think this is a step in the right direction 12/25/17; started his doxycycline Monday night. There is still intense erythema of the right leg especially in the anterior thigh although there is less tenderness. The erythema around the wound on the right lateral calf also is less tender. He still complaining of pain in the left heel. His wounds are about the same right lateral left medial left lateral. Superficial but certainly not close to closure. He denies being systemically unwell no fever chills no abdominal pain no diarrhea 12/29/17; back in follow-up of his extensive right calf and right thigh cellulitis. I added amoxicillin to cover possible doxycycline resistant  strep. This seems to of done the trick he is in much less pain there is much less erythema and swelling. He has his echocardiogram at 11:00 this morning. X-ray of the left heel was also negative. 01/05/18; the patient arrived with his edema under much better control. Now that he is retired he is able to use his compression pumps daily and sometimes twice a day per the patient. He has a wound on the right leg the lateral wound looks better. Area on the left leg also looks a lot better. He has no evidence of cellulitis in his bilateral thighs I had a quick peak at his echocardiogram. He is in normal ejection fraction and normal left ventricular function. He has moderate pulmonary hypertension moderately reduced right ventricular function. One would have to wonder about chronic sleep apnea although he says he doesn't snore. He'll review the echocardiogram with his cardiologist. 01/12/18; the patient arrives with the edema in both legs under exemplary control. He is using his compression pumps daily and sometimes twice daily. His wound on the right lateral leg is just about closed. He still has some weeping areas on the posterior left calf and lateral left calf although everything is just about closed here as well. I have spoken with Aldean Baker who is the patient's nurse practitioner and infectious disease. She was concerned that the patient had not understood that the parenteral penicillin injections he was receiving for cellulitis prophylaxis was actually benefiting him. I don't think the patient actually saw that I would tend to agree we were certainly dealing with less infections although he had a serious one last month. 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 He is healed. He'll be placed in juxtalite compression wraps and increase his lymphedema pumps to twice daily. We will follow up again next week to ensure there are no issues with the new  regiment. 01/20/18-he is here for evaluation of bilateral lower extremity weeping edema. Yesterday he was placed in compression wrap to the right lower extremity and compression stocking to left lower shrubbery. He states he uses lymphedema pumps last night and again this morning and noted a blister to the left lower extremity. On exam he was noted to have drainage to the right lower extremity. He will be placed in Unna boots bilaterally and follow-up next week 01/26/18; patient was  actually discharged a week ago to his own juxta light stockings only to return the next day with bilateral lower extremity weeping edema.he was placed in bilateral Unna boots. He arrives today with pain in the back of his left leg. There is no open area on the right leg however there is a linear/wrap injury on the left leg and weeping edema on the left leg posteriorly. I spoke with infectious disease about 10 days ago. They were disappointed that the patient elected to discontinue prophylactic intramuscular penicillin shots as they felt it was particularly beneficial in reducing the frequency of his cellulitis. I discussed this with the patient today. He does not share this view. He'll definitely need antibiotics today. Finally he is traveling to [place] and trauma leaving this Saturday and returning a week later and he does not travel with his pumps. He is going by car 01/30/18; patient was seen 4 days ago and brought back in today for review of cellulitis in the left leg posteriorly. I put him on amoxicillin this really hasn't helped as much as I might like. He is also worried because he is traveling to [facility] by car. Finally we will be rewrapping him. There is no open area on the right leg over his left leg has multiple weeping areas as usual 02/09/18; The same wrap on for 10 days. He did not pick up the last doxycycline I prescribed for him. He apparently took 4 days worth he already had. There is nothing open on  his right leg and the edema control is really quite good. He's had damage in the left leg medially and laterally especially probably related to the prolonged use of Unna boots 02/12/18; the patient arrived in clinic today for a nurse visit/wrap change. He complained of a lot of pain in the left posterior calf. He is taking doxycycline that I previously prescribed for him. Unfortunately even though he used his stockings and apparently used to compression pumps twice a day he has weeping edema coming out of the lateral part of his right leg. This is coming from the lower anterior lateral skin area. 02/16/18; the patient has finished his doxycycline and will finish the amoxicillin 2 days. The area of cellulitis in the left calf posteriorly has resolved. He is no longer having any pain. He tells me he is using his compression pumps at least once a day sometimes twice. 02/23/18; the patient finished his doxycycline and Amoxil last week. On Friday he noticed a small erythematous circle about the size of a quarter on the left lower leg just above his ankle. This rapidly expanded and he now has erythema on the lateral and posterior part of the thigh. This is bright red. Also has an area on the dorsal foot just above his toes and a tender area just below the left popliteal fossa. He came off his prophylactic penicillin injections at his own insistence one or 2 months ago. This is obviously deteriorated since then 03/02/18; patient is on doxycycline and Amoxil. Culture I did last week of the weeping area on the back of his left calf grew group B strep. I have therefore renewed the amoxicillin 500 3 times a day for a further week. He has not been systemically unwell. Still complaining of an area of discomfort right under his left popliteal fossa. There is no open wound on the right leg. He tells me that he is using his pumps twice a day on most days 03/09/18; patient arrives in  clinic today completing his amoxicillin  today. The cellulitis on his left leg is better. Furthermore he tells me that he had intramuscular penicillin shots that his primary care office today. However he also states that the wrap on his right leg fell down shortly after leaving clinic last week. He developed a large blister that was present when he came in for a nurse visit later in the week and then he developed intense discomfort around this area.He tells me he is using his compression pumps 03/16/18; the patient has completed his doxycycline. The infectious part of this/cellulitis in the left heel area left popliteal area is a lot better. He has 2 open areas on the right calf. Still areas on the left calf but this is a lot better as well. 03/24/18; the patient arrives complaining of pain in the left popliteal area again. He thinks some of this is wrap injury. He has no open area on the right leg and really no open area on the left calf either except for the popliteal area. He claims to be compliant with the compression pumps 03/31/18; I gave him doxycycline last week because of cellulitis in the left popliteal area. This is a lot better although the surface epithelium is denuded off and response to this. He arrives today with uncontrolled edema in the right calf area as well as a fingernail injury in the right lateral calf. There is only a few open areas on the left 04/06/18; I gave him amoxicillin doxycycline over the last 2 weeks that the amoxicillin should be completing currently. He is not complaining of any pain or systemic symptoms. The only open areas see has is on the right lateral lower leg paradoxically I cannot see anything on the left lower leg. He tells me he is using his compression pumps twice a day on most days. Silver alginate to the wounds that are open under 4 layer compression 04/13/18; he completed antibiotics and has no new complaints. Using his compression pumps. Silver alginate that anything that's opened 04/20/18; he is  using his compression pumps religiously. Silver alginate 4 layer compression anything that's opened. He comes in today with no open wounds on the left leg but 3 on the right including a new one posteriorly. He has 2 on the right lateral and one on the right posterior. He likes Unna boots on the right leg for reasons that aren't really clear we had the usual 4 layer compression on the left. It may be necessary to move to the 4 layer compression on the right however for now I left them in the Unna boots 04/27/18; he is using his compression pumps at least once a day. He has still the wounds on the right lateral calf. The area right posteriorly has closed. He does not have an open wound on the left under 4 layer compression however on the dorsal left foot just proximal to the toes and the left third toe 2 small open areas were identified 05/11/18; he has not uses compression pumps. The areas on the right lateral calf have coalesced into one large wound necrotic surface. On the left side he has one small wound anteriorly however the edema is now weeping out of a large part of his left leg. He says he wasn't using his pumps because of the weeping fluid. I explained to him that this is the time he needs to pump more 05/18/18; patient states he is using his compression pumps twice a day. The area on  the right lateral large wound albeit superficial. On the left side he has innumerable number of small new wounds on the left calf particularly laterally but several anteriorly and medially. All these appear to have healthy granulated base these look like the remnants of blisters however they occurred under compression. The patient arrives in clinic today with his legs somewhat better. There is certainly less edema, less multiple open areas on the left calf and the right anterior leg looks somewhat better as well superficial and a little smaller. However he relates pain and erythema over the last 3-4 days in the thigh  and I looked at this today. He has not been systemically unwell no fever no chills no change in blood sugar values 05/25/18; comes in today in a better state. The severe cellulitis on his left leg seems better with the Keflex. Not as tender. He has not been systemically unwell Hard to find an open wound on the left lower leg using his compression pumps twice a day The confluent wounds on his right lateral calf somewhat better looking. These will ultimately need debridement I didn't do this today. 06/01/18; the severe cellulitis on the left anterior thigh has resolved and he is completed his Keflex. There is no open wound on the left leg however there is a superficial excoriation at the base of the third toe dorsally. Skin on the bottom of his left foot is macerated looking. The left the wounds on the lateral right leg actually looks some better although he did require debridement of the top half of this wound area with an open curet 06/09/18 on evaluation today patient appears to be doing poorly in regard to his right lower extremity in particular this appears to likely be infected he has very thick purulent discharge along with a bright green tent to the discharge. This makes me concerned about the possibility of pseudomonas. He's also having increased discomfort at this point on evaluation. Fortunately there does not appear to be any evidence of infection spreading to the other location at this time. 06/16/18 on evaluation today patient appears to actually be doing fairly well. His ulcer has actually diminished in size quite significantly at this point which is good news. Nonetheless he still does have some evidence of infection he did see infectious disease this morning before coming here for his appointment. I did review the results of their evaluation and their note today. They did actually have him discontinue the Cipro and initiate treatment with linezolid at this time. He is doing this for the  next seven days and they recommended a follow-up in four months with them. He is the keep a log of the need for intermittent antibiotic therapy between now and when he falls back up with infectious disease. This will help them gaze what exactly they need to do to try and help them out. 06/23/18; the patient arrives today with no open wounds on the left leg and left third toe healed. He is been using his compression pumps twice a day. On the right lateral leg he still has a sizable wound but this is a lot better than last time I saw this. 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 Has been using silver alginate under 4 layer compression 06/30/18; the only open wound he has is on the right lateral leg and this looks healthy. No debridement is required. We have  been using silver alginate. He does not have an open wound on the left leg. There is apparently some drainage from the dorsal proximal third toe on the left although I see no open wound here. 07/03/18 on evaluation today patient was actually here just for a nurse visit rapid change. However when he was here on Wednesday for his rat change due to having been healed on the left and then developing blisters we initiated the wrap again knowing that he would be back today for [place] to reevaluate and see were at. Unfortunately he has developed some cellulitis into the proximal portion of his right lower extremity even into the region of his thigh. 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 He was placed on one as what at that point. Nonetheless he is done with that and has been tolerating it well otherwise. Doxycycline which in the past really did not seem to be effective for him. Nonetheless I think the best option may be for [place] to definitely reinitiate the antibiotics for a longer period of time. 07/07/18; since I  last saw this patient a week ago he has had a difficult time. At that point he did not have an open wound on his left leg. We transitioned him into juxta light stockings. He was apparently in the clinic the next day with blisters on the left lateral and left medial lower calf. He also had weeping edema fluid. He was put back into a compression wrap. He was also in the clinic on Friday with intense erythema in his right thigh. Per the patient he was started on Bactrim however that didn't work at all in terms of relieving his pain and swelling. He has taken 3 doxycycline that he had left over from last time and that seems to of helped. He has blistering on the right thigh as well. 07/14/18; the erythema on his right thigh has gotten better with doxycycline that he is finishing. 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 Presumably this cellulitis in the thigh was not related to that although I think this is a bit concerning going forward. He still has an area on the right lateral calf the blister on the right medial calf just below the knee that was discussed above. On the left 2 small open areas left medial and left lateral. Edema control is adequate. He is using his compression pumps twice a day 07/20/18; continued improvement in the condition of both legs especially the edema in his bilateral thighs. He tells me he is been losing weight through a combination of diet and exercise. He is using his compression pumps twice a day. So overall she made to the remaining wounds 07/27/2018; continued improvement in condition of both legs. His edema is well controlled. The area on the right lateral leg is just about closed he had one blisters show up on the medial left upper calf. We have him in 4 layer compression. He is going on a 10-day trip to [place], [place] and [place]. He will be driving. He wants to wear Unna boots because of  the lessening amount of constriction. He will not use compression pumps while he is away 08/05/18 on evaluation today patient actually appears to be doing decently well all things considered in regard to his bilateral lower extremities. The worst ulcer is actually only posterior aspect of  his left lower extremity with a four layer compression wrap cut into his leg a couple weeks back. He did have a trip and actually had [employer] for the trip that he is worn since he was last here. Nonetheless he feels like the [employer] actually do better for him his swelling is up a little bit but he also with his trip was not taking his Lasix on a regular set schedule like he was supposed to be. He states that obviously the reason being that he cannot drive and keep going without having to urinate too frequently which makes it difficult. He did not have his pumps with him while he was away either which I think also maybe playing a role here too. 08/13/2018; the patient only has a small open wound on the right lateral calf which is a big improvement in the last month or 2. He also has the area posteriorly just below the posterior fossa on the left which I think was a wrap injury from several weeks ago. He has no current evidence of cellulitis. He tells me he is back into his compression pumps twice a day. He also tells me that while he was at the laundromat somebody stole a section of his extremitease stockings 08/20/2018; back in the clinic with a much improved state. He only has small areas on the right lateral mid calf which is just about healed. This was is more substantial area for quite a prolonged period of time. He has a small open area on the left anterior tibia. The area on the posterior calf just below the popliteal fossa is closed today. He is using his compression pumps twice a day 08/28/2018; patient has no open wound on the right leg. He has a smattering of open areas on the calf with some  weeping lymphedema. More problematically than that it looks as though his wraps of slipped down in his usual he has very angry upper area of edema just below the right medial knee and on the right lateral calf. He has no open area on his feet. The patient is traveling to [facility] next week. I will send him in an antibiotic. We will continue to wrap the right leg. We ordered extremitease stockings for him last week and I plan to transition the right leg to a stocking when he gets home which will be in 10 days time. As usual he is very reluctant to take his pumps with him when he travels 09/07/2018; patient returns from [facility]. He shows me a picture of his left leg in the mid part of his trip last week with intense fire engine erythema. The picture look bad enough I would have considered sending him to the hospital. Instead he went to the wound care center in [facility]. They did not prescribe him antibiotics but he did take some doxycycline he had leftover from a previous visit. I had given him trimethoprim sulfamethoxazole before he left this did not work according to the patient. This is resulted in some improvement fortunately. He comes back with a large wound on the left posterior calf. Smaller area on the left anterior tibia. Denuded blisters on the dorsal left foot over his toes. Does not have much in the way of wounds on the right leg although he does have a very tender area on the right posterior area just below the popliteal fossa also suggestive of infection. He promises me he is back on his  pumps twice a day 09/15/2018; the intense cellulitis in his left lower calf is a lot better. The wound area on the posterior left calf is also so better. However he has reasonably extensive wounds on the dorsal aspect of his second and third toes and the proximal foot just at the base of the toes. There is nothing open on the right leg 09/22/2018; the patient has excellent edema  control in his legs bilaterally. He is using his external compression pumps twice a day. He has no open area on the right leg and only the areas in the left foot dorsally second and third toe area on the left side. He does not have any signs of active cellulitis. 10/06/2018; the patient has good edema control bilaterally. He has no open wound on the right leg. There is a blister in the posterior aspect of his left calf that we had to deal with today. He is using his compression pumps twice a day. There is no signs of active cellulitis. We have been using silver alginate to the wound areas. He still has vulnerable areas on the base of his left first second toes dorsally He has a his extremities stockings and we are going to transition him today into the stocking on the right leg. He is cautioned that he will need to continue to use the compression pumps twice a day. If he notices uncontrolled edema in the right leg he may need to go to 3 times a day. 10/13/2018; the patient came in for a nurse check on Friday he has a large flaccid blister on the right medial calf just below the knee. We unroofed this. He has this and a new area underneath the posterior mid calf which was undoubtedly a blister as well. He also has several small areas on the right which is the area we put his extremities stocking on. 10/19/2018; the patient went to see infectious disease this morning I am not sure if that was a routine follow-up in any case the doxycycline I had given him was discontinued and started on linezolid. He has not started this. It is easy to look at his left calf and the inflammation and think this is cellulitis however he is very tender in the tissue just below the popliteal fossa and I have no doubt that there is infection going on here. He states the problem he is having is that with the compression pumps the edema goes down and then starts walking the wrap falls down. We will see if we can adhere this. He  has 1 or 2 minuscule open areas on the right still areas that are weeping on the posterior left calf, the base of his left second and third toes 10/26/18; back today in clinic with quite of skin breakdown in his left anterior leg. This may have been infection the area below the popliteal fossa seems a lot better however tremendous epithelial loss on the left anterior mid tibia area over quite inexpensive tissue. He has 2 blisters on the right side but no other open wound here. 10/29/2018; came in urgently to see [place] today and we worked him in for review. He states that the 4 layer compression on the right leg caused pain he had to cut it down to roughly his mid calf this caused swelling above the wrap and he has blisters and skin breakdown today. As a result of the pain he has not been using his pumps. Both legs are a lot more  edematous and there is a lot of weeping fluid. 11/02/18; arrives in clinic with continued difficulties in the right leg> left. Leg is swollen and painful. multiple skin blisters and new open areas especially laterally. He has not been using his pumps on the right leg. 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 He is not systemically unwell. 11/09/2018; the patient claims he is being compliant with his pumps. He is finished the doxycycline I gave him last week. Culture I did of the wound on the right lateral leg showed a few very resistant methicillin staph aureus. This was resistant to doxycycline. Nevertheless he states the pain in the leg is a lot better which makes me wonder if the cultured organism was not really what was causing the problem nevertheless this is a very dangerous organism to be culturing out of any wound. His right leg is still a lot larger than the left. He is using an Radio broadcast assistant on this area, he blames a 4-layer compression for causing the original skin breakdown which I doubt is true however I cannot talk him out  of it. We have been using silver alginate to all of these areas which were initially blisters 11/16/2018; patient is being compliant with his external compression pumps at twice a day. Miraculously he arrives in clinic today with absolutely no open wounds. He has better edema control on the left where he has been using 4 layer compression versus wound of wounds on the right and I pointed this out to him. There is no inflammation in the skin in his lower legs which is also somewhat unusual for him. There is no open wounds on the dorsal left foot. He has extremitease stockings at home and I have asked him to bring these in next week. 11/25/18 patient's lower extremity on examination today on the left appears for the most part to be wound free. He does have an open wound on the lateral aspect of the right lower extremity but this is minimal compared to what I've seen in past. He does request that we go ahead and wrap the left leg as well even though there's nothing open just so hopefully it will not reopen in short order. 1/28; patient has superficial open wounds on the right lateral calf left anterior calf and left posterior calf. His edema control is adequate. 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 We have been using silver alginate as the primary dressing. He claims compliance with his compression pumps 2/4; patient has superficial open wounds on numerous areas of his left calf and again one on the left dorsal foot. The areas on the right lateral calf have healed. The cellulitis that I gave him doxycycline for last week is also resolved this was mostly on the left anterior calf just below the tibial tuberosity. His edema looks fairly well-controlled. He tells me he went to see his primary doctor today and had blood work ordered 2/11; once again he has several open areas on the left calf left tibial area. Most of  these are small and appear to have healthy granulation. He does not have anything open on the right. The edema and control in his thighs is pretty good which is usually a good indication he has been using his pumps as requested. 2/18; he continues to have several small areas on the left calf and left tibial  area. Most of these are small healthy granulation. We put him in his stocking on the right leg last week and he arrives with a superficial open area over the right upper tibia and a fairly large area on the right lateral tibia in similar condition. His edema control actually does not look too bad, he claims to be using his compression pumps twice a day 2/25. Continued small areas on the left calf and left tibial area. New areas especially on the right are identified just below the tibial tuberosity and on the right upper tibia itself. There are also areas of weeping edema fluid even without an obvious wound. He does not have a considerable degree of lymphedema but clearly there is more edema here than his skin can handle. He states he is using the pumps twice a day. We have an Unna boot on the right and 4 layer compression on the left. 3/3; he continues to have an area on the right lateral calf and right posterior calf just below the popliteal fossa. There is a fair amount of tenderness around the wound on the popliteal fossa but I did not see any evidence of cellulitis, could just be that the wrap came down and rubbed in this area. He does not have an open area on the left leg however there is an area on the left dorsal foot at the base of the third toe We have been using silver alginate to all wound areas 3/10; he did not have an open area on his left leg last time he was here a week ago. T oday he arrives with a horizontal wound just below the tibial tuberosity and an area on the left lateral calf. He has intense erythema and tenderness in this area. The area is on the right lateral calf and  right posterior calf better than last week. We have been using silver alginate as usual 3/18 - Patient returns with 3 small open areas on left calf, and 1 small open area on right calf, the skin looks ok with no significant erythema, he continues the UNA boot on right and 4 layer compression on left. The right lateral calf wound is closed , the right posterior is small area. we will continue silver alginate to the areas. 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 He is been tolerating the dressing changes without complication. Fortunately she has made excellent improvement in regard to the overall status of his wounds. Unfortunately every time we cease wrapping him he ends up reopening in causing more significant issues at that point. Again I'm unsure of the best direction to take although I think the lymphedema clinic may be appropriate for him. 02/03/19 on evaluation today patient appears to be doing well in regard to the wounds that we saw him for last week unfortunately he has a new area on the proximal portion of his right medial/posterior lower extremity where the wrap somewhat slowed down and caused swelling and a blister to rub and open. Unfortunately this is the only opening that he has on either leg at this point. 02/17/19 on evaluation today patient's bilateral lower extremities appear to be doing well. He still completely healed in regard to the left lower extremity. In regard to the right lower extremity the area where the wrap and slid down  and caused the blister still seems to be slightly open although this is dramatically better than during the last evaluation two weeks ago. I'm very pleased with the way this stands overall. 03/03/19 on evaluation today patient appears to be doing well in regard  to his right lower extremity in general although he did have a new blister open this does not appear to be showing any evidence of active infection at this time. Fortunately there's No fevers, chills, nausea, or vomiting noted at this time. Overall I feel like he is making good progress it does feel like that the right leg will we perform the [employer] seems to do with a bit better than three layer wrap on the left which slid down on him. We may switch to doing bilateral in the book wraps. 5/4; I have not seen Mr. Zelada in quite some time. According to our case manager he did not have an open wound on his left leg last week. He had 1 remaining wound on the right posterior medial calf. He arrives today with multiple openings on the left leg probably were blisters and/or wrap injuries from Unna boots. I do not think the Unna boot's will provide adequate compression on the left. I am also not clear about the frequency he is using the compression pumps. 03/17/19 on evaluation today patient appears to be doing excellent in regard to his lower extremities compared to last week's evaluation apparently. He had gotten significantly worse last week which is unfortunate. The [employer] wrap on the left did not seem to do very well for him at all and in fact it didn't control his swelling significantly enough he had an additional outbreak. Subsequently we go back to the four layer compression wrap on the left. This is good news. At least in that he is doing better and the wound seem to be killing him. He still has not heard anything from the lymphedema clinic. 03/24/19 on evaluation today patient actually appears to be doing much better in regard to his bilateral lower Trinity as compared to last week when I saw him. Fortunately there's no signs of active infection at this time. He has been tolerating the dressing changes without complication. Overall I'm extremely pleased with the progress and appearance in  general. 04/07/19 on evaluation today patient appears to be doing well in regard to his bilateral lower extremities. His swelling is significantly down from where it was previous. With that being said he does have a couple blisters still open at this point but fortunately nothing that seems to be too severe and again the majority of the larger openings has healed at this time. 04/14/19 on evaluation today patient actually appears to be doing quite well in regard to his bilateral lower extremities in fact I'm not even sure there's anything significantly open at this time at any site. Nonetheless he did have some trouble with these wraps where they are somewhat irritating him secondary to the fact that he has noted that the graph wasn't too close down to the end of this foot in a little bit short as well up to his knee. Otherwise things seem to be doing quite well. 04/21/19 upon evaluation today patient's wound bed actually showed evidence of being completely healed in regard to both lower extremities which is excellent news. There does not appear to be any signs of active infection which is also good news. I'm very pleased in this regard. No fevers, chills, nausea, or vomiting noted  at this time. 04/28/19 on evaluation today patient appears to be doing a little bit worse in regard to both lower extremities on the left mainly due to the fact that when he went infection disease the wrap was not wrapped quite high enough he developed a blister above this. On the right he is a small open area of nothing too significant but again this is continuing to give him some trouble he has been were in the Velcro compression that he has at home. 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 He's been tolerating the dressing changes without complication. Fortunately there's no signs of active infection at this time. No fevers, chills, nausea, or vomiting noted at  this time. We have been trying to get an appointment with her lymphedema clinic in [facility] but unfortunately nobody can get them on phone with not been able to even fax information over the patient likewise is not been able to get in touch with them. Overall I'm not sure exactly what's going on here with to reach out again today. 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 Still having a lot of drainage unfortunately. He tells me especially in the left but even on the right. There's no signs of active infection which is good news we've been using so ratcheted up to this point. 05/19/19 on evaluation today patient actually appears to be doing quite well with regard to his left lower extremity which is great news. Fortunately in regard to the right lower extremity has an issues with his wrap and he subsequently did remove this from what I'm understanding. Nonetheless long story short is what he had rewrapped once he removed it subsequently had maggots underneath this wrap whenever he came in for evaluation today. With that being said they were obviously completely cleaned away by the nursing staff. The visit today which is excellent news. However he does appear to potentially have some infection around the right ankle region where the maggots were located as well. He will likely require anabiotic therapy today. 05/26/19 on evaluation today patient actually appears to be doing much better in regard to his bilateral lower extremities. I feel like the infection is under much better control. With that being said there were maggots noted when the wrap was removed yet again today. Again this could have potentially been left over from previous although at this time there does not appear to be any signs of significant drainage there was obviously on the wrap some drainage as well this contracted gnats or  otherwise. Either way I do not see anything that appears to be doing worse in my pinion and in fact I think his drainage has slowed down quite significantly likely mainly due to the fact to his infection being under better control. 06/02/2019 on evaluation today patient actually appears to be doing well with regard to his bilateral lower extremities there is no signs of active infection at this time which is great news. With that being said he does have several open areas more so on the right than the left but nonetheless these are all significantly better than previously noted. 06/09/2019 on evaluation today patient actually appears to be doing well. His wrap stayed up and he did not cause any problems he had more drainage on the right compared to the left but overall I do not see  any major issues at this time which is great news. 06/16/2019 on evaluation today patient appears to be doing excellent with regard to his lower extremities the only area that is open is a new blister that can have opened as of today on the medial ankle on the left. Other than this he really seems to be doing great I see no major issues at this point. 06/23/2019 on evaluation today patient appears to be doing quite well with regard to his bilateral lower extremities. In fact he actually appears to be almost completely healed there is a small area of weeping noted of the right lower extremity just above the ankle. Nonetheless fortunately there is no signs of active infection at this time which is good news. No fevers, chills, nausea, vomiting, or diarrhea. 8/24; the patient arrived for a nurse visit today but complained of very significant pain in the left leg and therefore I was asked to look at this. Noted that he did not have an open area on the left leg last week nevertheless this was wrapped. The patient states that he is not been able to put his compression pumps on the left leg because of the discomfort. He has not been  systemically unwell 06/30/2019 on evaluation today patient unfortunately despite being excellent last week is doing much worse with regard to his left lower extremity today. In fact he had to come in for a nurse on Monday where his left leg had to be rewrapped due to excessive weeping Dr. Leanord Hawking placed him on doxycycline at that point. Fortunately there is no signs of active infection Systemically at this time which is good news. 07/07/2019 in regard to the patient's wounds today he actually seems to be doing well with his right lower extremity there really is nothing open or draining at this point this is great news. Unfortunately the left lower extremity is given him additional trouble at this time. There does not appear to be any signs of active infection nonetheless he does have a lot of edema and swelling noted at this point as well as blistering all of which has led to a much more poor appearing leg at this time compared to where it was 2 weeks ago when it was almost completely healed. Obviously this is a little discouraging for the patient. He is try to contact the lymphedema clinic in [place] he has not been able to get through to them. 07/14/2019 on evaluation today patient actually appears to be doing slightly better with regard to his left lower extremity ulcers. Overall I do feel like at least at the top of the wrap that we have been placing this area has healed quite nicely and looks much better. The remainder of the leg is showing signs of improvement. Unfortunately in the thigh area he still has an open region on the left and again on the right he has been utilizing just a Band-Aid on an area that also opened on the thigh. Again this is an area that were not able to wrap although we did do an Ace wrap to provide some compression that something that obviously is a little less effective than the compression wraps we have been using on the lower portion of the leg. He does have an  appointment with the lymphedema clinic in [facility] on Friday. 07/21/2019 on evaluation today patient appears to be doing better with regard to his lower extremity ulcers. He has been tolerating the dressing changes without complication. Fortunately there is  no signs of active infection at this time. No fevers, chills, nausea, vomiting, or diarrhea. I did receive the paperwork from the physical therapist at the lymphedema clinic in [place]. Subsequently I signed off on that this morning and sent that back to him for further progression with the treatment plan. 07/28/2019 on evaluation today patient appears to be doing very well with regard to his right lower extremity where I do not see any open wounds at this point. Fortunately he is feeling great as far as that is concerned as well. In regard to the left lower extremity he has been having issues with still several areas of weeping and edema although the upper leg is doing better his lower leg still I think is going require the compression wrap at this time. No fevers, chills, nausea, vomiting, or diarrhea. 08/04/2019 on evaluation today patient unfortunately is having new wounds on the right lower extremity. Again we have been using Unna boot wrap on that side. We switched him to using his juxta lite wrap at home. With that being said he tells me he has been using it although his legs extremely swollen and to be honest really does not appear that he has been. I cannot know that for sure however. Nonetheless he has multiple new wounds on the right lower extremity at this time. Obviously we will have to see about getting this rewrapped for him today. 08/11/2019 on evaluation today patient appears to be doing fairly well with regard to his wounds. He has been tolerating the dressing changes including the compression wraps without complication. He still has a lot of edema in his upper thigh regions bilaterally he is supposed to be seeing the  lymphedema clinic on the 15th of this month once his wraps arrive for the upper part of his legs. 08/18/2019 on evaluation today patient appears to be doing well with regard to his bilateral lower extremities at this point. He has been tolerating the dressing changes without complication. Fortunately there is no signs of active infection which is also good news. He does have a couple weeping areas on the first and second toe of the right foot he also has just a small area on the left foot upper leg and a small area on the left lower leg but overall he is doing quite well in my opinion. He is supposed to be getting his wraps shortly in fact tomorrow and then subsequently is seeing the lymphedema clinic next Wednesday on the 21st. Of note he is also leaving on the 25th to go on vacation for a [place] to the beach. For that reason and since there is some uncertainty about what there can be doing at lymphedema clinic next Wednesday I am get a make an appointment for next Friday here for [place] to see what we need to do for him prior to him leaving for vacation. 10/23; patient arrives in considerable pain predominantly in the upper posterior calf just distal to the popliteal fossa also in the wound anteriorly above the major wound. This is probably cellulitis and he has had this recurrently in the past. He has no open wound on the right side and he has had an Radio broadcast assistant in that area. Finally I note that he has an area on the left posterior calf which by enlarge is mostly epithelialized. This protrudes beyond the borders of the surrounding skin in the setting of dry scaly skin and lymphedema. The patient is leaving for [facility] on Sunday. Per  his longstanding pattern, he will not take his compression pumps with him predominantly out of fear that they will be stolen. He therefore asked that we put a Unna boot back on the right leg. He will also contact the wound care center in [facility] to see if they can  change his dressing in the mid week. 11/3; patient returned from his vacation to [facility]. He was seen on 1 occasion at their wound care center. They did a 2 layer compression system as they did not have our 4-layer wrap. I am not completely certain what they put on the wounds. They did not change the Unna boot on the right. The patient is also seeing a lymphedema specialist physical therapist in [place]. It appears that he has some compression sleeve for his thighs which indeed look quite a bit better than I am used to seeing. He pumps over these with his external compression pumps. 11/10; the patient has a new wound on the right medial thigh otherwise there is no open areas on the right. He has an area on the left leg posteriorly anteriorly and medially and an area over the left second toe. We have been using silver alginate. He thinks the injury on his thigh is secondary to friction from the compression sleeve he has. 11/17; the patient has a new wound on the right medial thigh last week. He thinks this is because he did not have a underlying stocking for his thigh juxta lite apparatus. He now has this. The area is fairly large and somewhat angry but I do not think he has underlying cellulitis. He has a intact blister on the right anterior tibial area. Small wound on the right great toe dorsally Small area on the medial left calf. 11/30; the patient does not have any open areas on his right leg and we did not take his juxta lite stocking off. However he states that on Friday his compression wrap fell down lodging around his upper mid calf area. As usual this creates a lot of problems for him. He called urgently today to be seen for a nurse visit however the nurse visit turned into a provider visit because of extreme erythema and pain in the left anterior tibia extending laterally and posteriorly. The area that is problematic is extensive 10/06/2019 upon evaluation today patient actually  appears to be doing poorly in regard to his left lower extremity. He Dr. Leanord Hawking did place him on doxycycline this past Monday apparently due to the fact that he was doing much worse in regard to this left leg. Fortunately the doxycycline does seem to be helping. Unfortunately we are still having a very difficult time getting his edema under any type of control in order to anticipate discharge at some point. The only way were really able to control his lymphedema really is with compression wraps and that has only even seemingly temporary. He has been seeing a lymphedema clinic they are trying to help in this regard but still this has been somewhat frustrating in general for the patient. 10/13/19 on evaluation today patient appears to be doing excellent with regard to his right lower extremity as far as the wounds are concerned. His swelling is still quite extensive unfortunately. He is still having a lot of drainage from the thigh areas bilaterally which is unfortunate. He's been going to lymphedema clinic but again he still really does not have this edema under control as far as his lower extremities are concern. With regard  to his left lower extremity this seems to be improving and I do believe the doxycycline has been of benefit for him. He is about to complete the doxycycline. 10/20/2019 on evaluation today patient appears to be doing poorly in regard to his bilateral lower extremities. More in the right thigh he has a lot of irritation at this site unfortunately. In regard to the left lower extremity the wrap was not quite as high it appears and does seem to have caused him some trouble as well. Fortunately there is no evidence of systemic infection though he does have some blue-green drainage which has me concerned for the possibility of Pseudomonas. He tells me he is previously taking Cipro without complications and he really does not care for Levaquin however due to some of the side effects he  has. He is not allergic to any medications specifically antibiotics that were aware of. 10/27/2019 on evaluation today patient actually does appear to be for the most part doing better when compared to last week's evaluation. With that being said he still has multiple open wounds over the bilateral lower extremities. He actually forgot to start taking the Cipro and states that he still has the whole bottle. He does have several new blisters on left lower extremity today I think I would recommend he go ahead and take the Cipro based on what I am seeing at this point. 12/30-Patient comes at 1 week visit, 4 layer compression wraps on the left and Unna boot on the right, primary dressing Xtrasorb and silver alginate. Patient is taking his Cipro and has a few more days left probably 5-6, and the legs are doing better. He states he is using his compressions devices which I believe he has 11/10/2019 on evaluation today patient actually appears to be much better than last time I saw him 2 weeks ago. His wounds are significantly improved and overall I am very pleased in this regard. Fortunately there is no signs of active infection at this time. He is just a couple of days away from completing Cipro. Overall his edema is much better he has been using his lymphedema pumps which I think is also helping at this point. 11/17/2019 on evaluation today patient appears to be doing excellent in regard to his wounds in general. His legs are swollen but not nearly as much as they have been in the past. Fortunately he is tolerating the compression wraps without complication. No fevers, chills, nausea, vomiting, or diarrhea. He does have some erythema however in the distal portion of his right lower extremity specifically around the forefoot and toes there is a little bit of warmth here as well. 11/24/2019 on evaluation today patient appears to be doing well with regard to his right lower extremity I really do not see any open  wounds at this point. His left lower extremity does have several open areas and his right medial thigh also is open. Other than this however overall the patient seems to be making good progress and I am very pleased at this point. 12/01/2019 on evaluation today patient appears to be doing poorly at this point in regard to his left lower extremity has several new blisters despite the fact that we have him in compression wraps. In fact he had a 4-layer compression wrap, his upper thigh wrapped from lymphedema clinic, and a juxta light over top of the 4 layer compression wrap the lymphedema clinic applied and despite all this he still develop blisters underneath. Obviously this does  have me concerned about the fact that unfortunately despite what we are doing to try to get wounds healed he continues to have new areas arise I do not think he is ever good to be at the point where he can realistically just use wraps at home to keep things under control. Typically when we heal him it takes about 1-2 days before he is back in the clinic with severe breakdown and blistering of his lower extremities bilaterally. This is happened numerous times in the past. Unfortunately I think that we may need some help as far as overall fluid overload to kind of limit what we are seeing and get things under better control. 12/08/2019 on evaluation today patient presents for follow-up concerning his ongoing bilateral lower extremity edema. Unfortunately he is still having quite a bit of swelling the compression wraps are controlling this to some degree but he did see Dr. [name] his cardiologist I do have that available for review today as far as the appointment was concerned that was on 12/06/2019. Obviously that she has been 2 days ago. The patient states that he is only been taking the Lasix 80 mg 1 time a day he had told me previously he was taking this twice a day. Nonetheless Dr. [name] recommended this be up to 80 mg 2 times a  day for the patient as he did appear to be fluid overloaded. With that being said the patient states he did this yesterday and he was unable to go anywhere or do anything due to the fact that he was constantly having to urinate. Nonetheless I think that this is still good to be something that is important for him as far as trying to get his edema under control at all things that he is going to be able to just expect his wounds to get under control and things to be better without going through at least a period of time where he is trying to stabilize his fluid management in general and I think increasing the Lasix is likely the first step here. It was also mentioned the possibility that the patient may require metolazone. With that being said he wanted to have the patient take Lasix twice a day first and then reevaluating 2 months to see where things stand. 12/15/2019 upon evaluation today patient appears to be doing regard to his legs although his toes are showing some signs of weeping especially on the left at this point to some degree on the right. There does not appear to be any signs of active infection and overall I do feel like the compression wraps are doing well for him but he has not been able to take the Lasix at home and the increased dose that Dr. [name] recommended. He tells me that just not go to be feasible for him. Nonetheless I think in this case he should probably send a message to Dr. [name] in order to discuss options from the standpoint of possible admission to get the fluid off or otherwise going forward. 12/22/2019 upon evaluation today patient appears to be doing fairly well with regard to his lower extremities at this point. In fact he would be doing excellent if it was not for the fact that his right anterior thigh apparently had an allergic reaction to adhesive tape that he used. The wound itself that we have been monitoring actually appears to be healed. There is a lot of  irritation at this point. 12/29/2019 upon evaluation today patient appears to  be doing well in regard to his lower extremities. His left medial thigh is open and somewhat draining today but this is the only region that is open the right has done much better with the treatment utilizing the steroid cream that I prescribed for him last week. Overall I am pleased in that regard. Fortunately there is no signs of active infection at this time. No fevers, chills, nausea, vomiting, or diarrhea. 01/05/2020 upon evaluation today patient appears to be doing more poorly in regard to his right lower extremity at this point upon evaluation today. Unfortunately he continues to have issues in this regard and I think the biggest issue is controlling his edema. This obviously is not very well controlled at this point is been recommended that he use the Lasix twice a day but he has not been able to do that. Unfortunately I think this is leading to an issue where honestly he is not really able to effectively control his edema and therefore the wounds really are not doing significantly better. I do not think that he is going to be able to keep things under good control unless he is able to control his edema much better. I discussed this again in great detail with him today. 01/12/2020 good news is patient actually appears to be doing quite well today at this point. He does have an appointment with lymphedema clinic tomorrow. His legs appear healed and the toe on the left is almost completely healed. In general I am very pleased with how things stand at this point. 01/19/2020 upon evaluation today patient appears to actually be doing well in regard to his lower extremities there is nothing open at this point. Fortunately he has done extremely well more recently. Has been seeing lymphedema clinic as well. With that being said he has Velcro wraps for his lower legs as well as his upper legs. The only wound really is on his toe  which is the right great toe and this is barely anything even there. With all that being said I think it is good to be appropriate today to go ahead and switch him over to the Velcro compression wraps. 01/26/2020 upon evaluation today patient appears to be doing worse with regard to his lower extremities after last week switch him to Velcro compression wraps. Unfortunately he lasted less than 24 hours he did not have the sock portion of his Velcro wrap on the left leg and subsequently developed a blister underneath the Velcro portion. Obviously this is not good and not what we were looking for at this point. He states the lymphedema clinic did tell him to wear the wrap for 23 hours and take him off for 1 I am okay with that plan but again right now we got a get things back under control again he may have some cellulitis noted as well. 02/02/2020 upon evaluation today patient unfortunately appears to have several areas of blistering on his bilateral lower extremities today mainly on the feet. His legs do seem to be doing somewhat better which is good news. Fortunately there is no evidence of active infection at this time. No fevers, chills, nausea, vomiting, or diarrhea. 02/16/2020 upon evaluation today patient appears to be doing well at this time with regard to his legs. He has a couple weeping areas on his toes but for the most part everything is doing better and does appear to be sealed up on his legs which is excellent news. We can continue with wrapping him  at this point as he had every time we discontinue the wraps he just breaks out with new wounds. There is really no point in is going forward with this at this point. 03/08/2020 upon evaluation today patient actually appears to be doing quite well with regard to his lower extremity ulcers. He has just a very superficial and really almost nonexistent blister on the left lower extremity he has in general done very well with the compression wraps. With  that being said I do not see any signs of infection at this time which is good news. 03/29/2020 upon evaluation today patient appears to be doing well with regard to his wounds currently except for where he had several new areas that opened up due to some of the wrap slipping and causing him trouble. He states he did not realize they had slipped. Nonetheless he has a 1 area on the right and 3 new areas on the left. Fortunately there is no signs of active infection at this time which is great news. 04/05/2020 upon evaluation today patient actually appears to be doing quite well in general in regard to his legs currently. Fortunately there is no signs of active infection at this time. No fevers, chills, nausea, vomiting, or diarrhea. He tells me next week that he will actually be seen in the lymphedema clinic on Thursday at 10 AM I see him on Wednesday next week. 04/12/2020 upon evaluation today patient appears to be doing very well with regard to his lower extremities bilaterally. In fact he does not appear to have any open wounds at this point which is good news. Fortunately there is no signs of active infection at this time. No fevers, chills, nausea, vomiting, or diarrhea. 04/19/2020 upon evaluation today patient appears to be doing well with regard to his wounds currently on the bilateral lower extremities. There does not appear to be any signs of active infection at this time. Fortunately there is no evidence of systemic infection and overall very pleased at this point. Nonetheless after I held him out last week he literally had blisters the next morning already which swelled up with him being right back here in the clinic. Overall I think that he is just not can be able to be discharged with his legs the way they are he is much to volume overloaded as far as fluid is concerned and that was discussed with him today of also discussed this but should try the clinic nurse manager as well as Dr.  Leanord Hawking. 04/26/2020 upon evaluation today patient appears to be doing better with regard to his wounds currently. He is making some progress and overall swelling is under good control with the compression wraps. Fortunately there is no evidence of active infection at this time. 05/10/2020 on evaluation today patient appears to be doing overall well in regard to his lower extremities bilaterally. He is Tolerating the compression wraps without complication and with what we are seeing currently I feel like that he is making excellent progress. There is no signs of active infection at this time. 05/24/2020 upon evaluation today patient appears to be doing well in regard to his legs. The swelling is actually quite a bit down compared to where it has been in the past. Fortunately there is no sign of active infection at this time which is also good news. With that being said he does have several wounds on his toes that have opened up at this point. 05/31/2020 upon evaluation today patient appears to  be doing well with regard to his legs bilaterally where he really has no significant fluid buildup at this point overall he seems to be doing quite well. Very pleased in this regard. With regard to his toes these also seem to be drying up which is excellent. We have continue to wrap him as every time we tried as a transition to the juxta light wraps things just do not seem to get any better. 06/07/2020 upon evaluation today patient appears to be doing well with regard to his right leg at this point. Unfortunately left leg has a lot of blistering he tells me the wrap started to slide down on him when he tried to put his other Velcro wrap over top of it to help keep things in order but nonetheless still had some issues. 06/14/2020 on evaluation today patient appears to be doing well with regard to his lower extremity ulcers and foot ulcers at this point. I feel like everything is actually showing signs of improvement which  is great news overall there is no signs of active infection at this time. No fevers, chills, nausea, vomiting, or diarrhea. 06/21/2020 on evaluation today patient actually appears to be doing okay in regard to his wounds in general. With that being said the biggest issue I see is on his right foot in particular the first and second toe seem to be doing a little worse due to the fact this is staying very wet. I think he is probably getting need to change out his dressings a couple times in between each week when we see him in regard to his toes in order to keep this drier based on the location and how this is proceeding. 06/28/2020 on evaluation today patient appears to be doing a little bit more poorly overall in regard to the appearance of the skin I am actually somewhat concerned about the possibility of him having a little bit of an infection here. We discussed the course of potentially giving him a doxycycline prescription which he is taken previously with good result. With that being said I do believe that this is potentially mild and at this point easily fixed. I just do not want anything to get any worse. 07/12/2020 upon evaluation today patient actually appears to be making some progress with regard to his legs which is great news there does not appear to be any evidence of active infection. Overall very pleased with where things stand. Electronic Signature(s) Signed: 07/12/2020 5:40:42 PM By: Lenda Kelp PA-C Entered By: Lenda Kelp on 07/12/2020 17:40:42 -------------------------------------------------------------------------------- Physical Exam Details Patient Name: Date of Service: [name], [name] 07/12/2020 11:00 A M Medical Record Number: [id number] Patient Account Number: [id number] Date of Birth/Sex: Treating RN: Dec 17, 1950 (69 y.o. [name] Primary Care Provider: Marney Setting, [name] Other Clinician: Referring Provider: Treating Provider/Extender: Lenda Kelp MCGO  [name], [name] in Treatment: 233 Constitutional Well-nourished and well-hydrated in no acute distress. Respiratory normal breathing without difficulty. Psychiatric this patient is able to make decisions and demonstrates good insight into disease process. Alert and Oriented x 3. pleasant and cooperative. Notes Patient's wounds currently are showing signs of good improvement I see no evidence of active infection overall he is progressing quite nicely. Electronic Signature(s) Signed: 07/12/2020 5:41:11 PM By: Lenda Kelp PA-C Entered By: Lenda Kelp on 07/12/2020 17:41:11 -------------------------------------------------------------------------------- Physician Orders Details Patient Name: Date of Service: [name], [name] 07/12/2020 11:00 A M Medical Record Number: [id number] Patient Account Number:  [id number] Date of Birth/Sex: Treating RN: 1951-10-24 (69 y.o. 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 - with Leonard Schwartz Return A Nurse Visit: - 1 week Dressing Change Frequency Wound #180 Left,Proximal,Dorsal Foot Do not change entire dressing for one week. - both legs Skin Barriers/Peri-Wound Care Moisturizing lotion - both legs and feet and toes Wound  Cleansing May shower with protection. Primary Wound Dressing lginate with Silver - to any blisters or weeping areas Calcium A Wound #180 Left,Proximal,Dorsal Foot Calcium Alginate with Silver Secondary Dressing Wound #180 Left,Proximal,Dorsal Foot Dry Gauze Edema Control 4 layer compression: Left lower extremity Unna Boot to Right Lower Extremity Avoid standing for long periods of time Elevate legs to the level of the heart or above for 30 minutes daily and/or when sitting, a frequency of: - throughout the day Exercise regularly Electronic Signature(s) Signed: 07/12/2020 5:28:47 PM By: Zenaida Deed RN, BSN Signed: 07/12/2020 5:46:37 PM By: Lenda Kelp PA-C Entered By: Zenaida Deed on 07/12/2020 12:05:48 -------------------------------------------------------------------------------- Problem List Details Patient Name: Date of Service: [name], [name] 07/12/2020 11:00 A M Medical Record Number: [id number] Patient Account Number: [id number] Date of Birth/Sex: Treating RN: [birth year] (69 y.o. [name] Primary Care Provider: Marney Setting, [name] Other Clinician: Referring Provider: Treating Provider/Extender: Suzy Bouchard, [name] in Treatment: 233 Active Problems ICD-10 Encounter Code Description Active Date MDM Diagnosis 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 skin 06/30/2018 No Yes L97.221 Non-pressure chronic ulcer of left calf limited to breakdown of skin 09/30/2016 No Yes 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 of 01/22/2016 No Yes bilateral lower extremity I89.0 Lymphedema, not elsewhere classified 01/22/2016 No Yes E11.622 Type 2 diabetes mellitus with other skin ulcer 01/22/2016 No Yes 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, unspecified 01/22/2016 No Yes L03.116 Cellulitis of left lower limb 04/01/2017 No Yes Inactive Problems ICD-10 Code Description Active  Date Inactive Date 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 skin 06/30/2017 06/30/2017 L97.521 Non-pressure chronic ulcer of other 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 of skin 04/27/2018 04/27/2018 L03.115 Cellulitis of right lower limb 12/22/2017 12/22/2017 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 06/30/2018 06/30/2018 L97.511 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 of skin 06/30/2018 06/30/2018 Resolved Problems Electronic Signature(s) Signed: 07/12/2020 11:12:38 AM By: Lenda Kelp PA-C Entered By: Lenda Kelp on 07/12/2020 11:12:36 -------------------------------------------------------------------------------- Progress Note Details Patient Name: Date of Service: [name], [name] 07/12/2020 11:00 A M Medical Record Number: [id number] Patient Account Number: [id number] Date of Birth/Sex: Treating RN: 04-Sep-1951 (69 y.o. [name] Primary Care Provider: Marney Setting, [name] Other Clinician: Referring Provider: Treating Provider/Extender: Lenda Kelp MCGO [name], [name] in Treatment: 15 Subjective Chief Complaint Information obtained from Patient patient is here for evaluation venous/lymphedema weeping History of Present Illness (  HPI) Referred by PCP for consultation. Patient has long standing history of BLE venous stasis, no prior ulcerations. At beginning of month, developed cellulitis and weeping. Received IM Rocephin followed by Keflex and resolved. Wears compression stocking, appr 6 months old. Not sure strength. No present drainage. 01/22/16 this is a patient who is a type II diabetic on insulin. 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 He tells me he religiously wears pressure stockings of uncertain strength. He was here with weeping edema about 8 months ago but did not have an open wound. Roughly a month ago he had a reopening on his  bilateral legs. He is been using bandages and Neosporin. He does not complain of pain. 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 Last BUN/creatinine I have is from 11/20/15 at 13 and 1.0 respectively 01/29/16; patient arrives today having tolerated the Profore wrap. He brought in his stockings and these are 18 mmHg stockings he bought from [employer]. The compression here is likely inadequate. He does not complain of pain or excessive drainage she has no systemic symptoms. The wound on the right looks improved as does the one on the left although one on the left is more substantial with still tissue at risk below the actual wound area on the bilateral posterior calf 02/05/16; patient arrives with poor edema control. He states that we did put a 4 layer compression on it last week. No weight appear 5 this. 02/12/16; the area on the posterior right Has healed. The left Has a substantial wound that has necrotic surface eschar that requires a debridement with a curette. 02/16/16;the patient called or a Nurse visit secondary to increased swelling. He had been in earlier in the week with his right leg healed. He was transitioned to is on pressure stocking on the right leg with the only open wound on the left, a substantial area on the left posterior calf. Note he has a history of severe lower extremity edema, he has a history of chronic atrial fibrillation but not heart failure per my notes but I'll need to research this. He is not complaining of chest pain shortness of breath or orthopnea. The intake nurse noted blisters on the previously closed right leg 02/19/16; this is the patient's regular visit day. I see him on Friday with escalating edema new wounds on the right leg and clear signs of at least right ventricular heart failure. I increased his Lasix to 40 twice a day. He is returning currently in  follow-up. States he is noticed a decrease in that the edema 02/26/16 patient's legs have much less edema. There is nothing really open on the right leg. The left leg has improved condition of the large superficial wound on the posterior left leg 03/04/16; edema control is very much better. The patient's right leg wounds have healed. On the left leg he continues to have severe venous inflammation on the posterior aspect of the left leg. There is no tenderness and I don't think any of this is cellulitis. 03/11/16; patient's right leg is married healed and he is in his own stocking. The patient's left leg has deteriorated somewhat. There is a lot of erythema around the wound on the posterior left leg. There is also a significant rim of erythema posteriorly just above where the wrap would've ended there is a new wound in this location and a  lot of tenderness. Can't rule out cellulitis in this area. 03/15/16; patient's right leg remains healed and he is in his own stocking. The patient's left leg is much better than last review. His major wound on the posterior aspect of his left Is almost fully epithelialized. He has 3 small injuries from the wraps. Really. Erythema seems a lot better on antibiotics 03/18/16; right leg remains healed and he is in his own stocking. The patient's left leg is much better. The area on the posterior aspect of the left calf is fully epithelialized. His 3 small injuries which were wrap injuries on the left are improved only one seems still open his erythema has resolved 03/25/16; patient's right leg remains healed and he is in his own stocking. There is no open area today on the left leg posterior leg is completely closed up. His wrap injuries at the superior aspect of his leg are also resolved. He looks as though he has some irritation on the dorsal ankle but this is fully epithelialized without evidence of infection. 03/28/16; we discharged this patient on Monday. Transitioned him  into his own stocking. There were problems almost immediately with uncontrolled swelling weeping edema multiple some of which have opened. He does not feel systemically unwell in particular no chest pain no shortness of breath and he does not feel 04/08/16; the edema is under better control with the Profore light wrap but he still has pitting edema. There is one large wound anteriorly 2 on the medial aspect of his left leg and 3 small areas on the superior posterior calf. Drainage is not excessive he is tolerating a Profore light well 04/15/16; put a Profore wrap on him last week. This is controlled is edema however he had a lot of pain on his left anterior foot most of his wounds are healed 04/22/16 once again the patient has denuded areas on the left anterior foot which he states are because his wrap slips up word. He saw his primary physician today is on Lasix 40 twice a day and states that he his weight is down 20 pounds over the last 3 months. 04/29/16: Much improved. left anterior foot much improved. He is now on Lasix 80 mg per day. Much improved edema control 05/06/16; I was hoping to be able to discharge him today however once again he has blisters at a low level of where the compression was placed last week mostly on his left lateral but also his left medial leg and a small area on the anterior part of the left foot. 05/09/16; apparently the patient went home after his appointment on 7/4 later in the evening developing pain in his upper medial thigh together with subjective fever and chills although his temperature was not taken. The pain was so intense he felt he would probably have to call 911. However he then remembered that he had leftover doxycycline from a previous round of antibiotics and took these. By the next morning he felt a lot better. He called and spoke to one of our nurses and I approved doxycycline over the phone thinking that this was in relation to the wounds we had previously seen  although they were definitely were not. The patient feels a lot better old fever no chills he is still working. Blood sugars are reasonably controlled 05/13/16; patient is back in for review of his cellulitis on his anterior medial upper thigh. He is taking doxycycline this is a lot better. Culture I did of the nodular  area on the dorsal aspect of his foot grew MRSA this also looks a lot better. 05/20/16; the patient is cellulitis on the medial upper thigh has resolved. All of his wound areas including the left anterior foot, areas on the medial aspect of the left calf and the lateral aspect of the calf at all resolved. He has a new blister on the left dorsal foot at the level of the fourth toe this was excised. No evidence of infection 05/27/16; patient continues to complain weeping edema. He has new blisterlike wounds on the left anterior lateral and posterior lateral calf at the top of his wrap levels. The area on his left anterior foot appears better. He is not complaining of fever, pain or pruritus in his feet. 05/30/16; the patient's blisters on his left anterior leg posterior calf all look improved. He did not increase the Lasix 100 mg as I suggested because he was going to run out of his 40 mg tablets. He is still having weeping edema of his toes 06/03/16; I renewed his Lasix at 80 mg once a day as he was about to run out when I last saw him. He is on 80 mg of Lasix now. I have asked him to cut down on the excessive amount of water he was drinking and asked him to drink according to his thirst mechanisms 06/12/2016 -- was seen 2 days ago and was supposed to wear his compression stockings at home but he is developed lymphedema and superficial blisters on the left lower extremity and hence came in for a review 06/24/16; the remaining wound is on his left anterior leg. He still has edema coming from between his toes. There is lymphedema here however his edema is generally better than when I last saw  this. He has a history of atrial fibrillation but does not have a known history of congestive heart failure nevertheless I think he probably has this at least on a diastolic basis. 07/01/16 I reviewed his echocardiogram from January 2017. This was essentially normal. He did not have LVH, EF of 55-60%. His right ventricular function was normal although he did have trivial tricuspid and pulmonic regurgitation. This is not audible on exam however. I increased his Lasix to do massive edema in his legs well above his knees I think in early July. He was also drinking an excessive amount of water at the time. 07/15/16; missed his appointment last week because of the Labor Day holiday on Monday. He could not get another appointment later in the week. Started to feel the wrap digging in superiorly so we remove the top half and the bottom half of his wrap. He has extensive erythema and blistering superiorly in the left leg. Very tender. Very swollen. Edema in his foot with leaking edema fluid. He has not been systemically unwell 07/22/16; the area on the left leg laterally required some debridement. The medial wounds look more stable. His wrap injury wounds appear to have healed. Edema and his foot is better, weeping edema is also better. He tells me he is meeting with the supplier of the external compression pumps at work 08/05/16; the patient was on vacation last week in [facility]. His wrap is been on for an extended period of time. Also over the weekend he developed an extensive area of tender erythema across his anterior medial thigh. He took to doxycycline yesterday that he had leftover from a previous prescription. The patient complains of weeping edema coming out of his toes  08/08/16; I saw this patient on 10/2. He was tender across his anterior thigh. I put him on doxycycline. He returns today in follow-up. He does not have any open wounds on his lower leg, he still has edema weeping into his  toes. 08/12/16; patient was seen back urgently today to follow-up for his extensive left thigh cellulitis/erysipelas. He comes back with a lot less swelling and erythema pain is much better. I believe I gave him Augmentin and Cipro. His wrap was cut down as he stated a roll down his legs. He developed blistering above the level of the wrap that remained. He has 2 open blisters and 1 intact. 08/19/16; patient is been doing his primary doctor who is increased his Lasix from 40-80 once a day or 80 already has less edema. Cellulitis has remained improved in the left thigh. 2 open areas on the posterior left calf 08/26/16; he returns today having new open blisters on the anterior part of his left leg. He has his compression pumps but is not yet been shown how to use some vital representative from the supplier. 09/02/16 patient returns today with no open wounds on the left leg. Some maceration in his plantar toes 09/10/2016 -- Dr. Leanord Hawking had recently discharged him on 09/02/2016 and he has come right back with redness swelling and some open ulcers on his left lower extremity. He says this was caused by trying to apply his compression stockings and he's been unable to use this and has not been able to use his lymphedema pumps. He had some doxycycline leftover and he has started on this a few days ago. 09/16/16; there are no open wounds on his leg on the left and no evidence of cellulitis. 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 He does not complain of symptoms here. I am not clear use using his external compression pumps. 09/23/16; I have not seen this patient in 2 weeks. He canceled his appointment 10 days ago as he was going on vacation. He tells me that on Monday he noticed a large area on his posterior left leg which is been draining copiously and is reopened into a large wound. He is been using ABDs and the external part of his juxtalite,  according to our nurse this was not on properly. 10/07/16; Still a substantial area on the posterior left leg. Using silver alginate 10/14/16; in general better although there is still open area which looks healthy. Still using silver alginate. He reminds me that this happen before he left for [facility]. T oday while he was showering in the morning. He had been using his juxtalite's 10/21/16; the area on his posterior left leg is fully epithelialized. However he arrives today with a large area of tender erythema in his medial and posterior left thigh just above the knee. I have marked the area. Once again he is reluctant to consider hospitalization. I treated him with oral antibiotics in the past for a similar situation with resolution I think with doxycycline however this area it seems more extensive to me. He is not complaining of fever but does have chills and says states he is thirsty. His blood sugar today was in the 140s at home 10/25/16 the area on his posterior left leg is fully epithelialized although there is still some weeping edema. The large area of tenderness and erythema in his medial and posterior left thigh is a lot less tender although there is still  a lot of swelling in this thigh. He states he feels a lot better. He is on doxycycline and Augmentin that I started last week. This will continued until Tuesday, December 26. I have ordered a duplex ultrasound of the left thigh rule out DVT whether there is an abscess something that would need to be drained I would also like to know. 11/01/16; he still has weeping edema from a not fully epithelialized area on his left posterior calf. Most of the rest of this looks a lot better. He has completed his antibiotics. His thigh is a lot better. Duplex ultrasound did not show a DVT in the thigh 11/08/16; he comes in today with more Denuded surface epithelium from the posterior aspect of his calf. There is no real evidence of cellulitis. The  superior aspect of his wrap appears to have put quite an indentation in his leg just below the knee and this may have contributed. He does not complain of pain or fever. We have been using silver alginate as the primary dressing. The area of cellulitis in the right thigh has totally resolved. He has been using his compression stockings once a week 11/15/16; the patient arrives today with more loss of epithelium from the posterior aspect of his left calf. He now has a fairly substantial wound in this area. The reason behind this deterioration isn't exactly clear although his edema is not well controlled. He states he feels he is generally more swollen systemically. He is not complaining of chest pain shortness of breath fever. T me he has an appointment with his primary physician in early February. He is on 80 mg of oral ells Lasix a day. He claims compliance with the external compression pumps. He is not having any pain in his legs similar to what he has with his recurrent cellulitis 11/22/16; the patient arrives a follow-up of his large area on his left lateral calf. This looks somewhat better today. He came in earlier in the week for a dressing change since I saw him a week ago. He is not complaining of any pain no shortness of breath no chest pain 11/28/16; the patient arrives for follow-up of his large area on the left lateral calf this does not look better. In fact it is larger weeping edema. The surface of the wound does not look too bad. We have been using silver alginate although I'm not certain that this is a dressing issue. 12/05/16; again the patient follows up for a large wound on the left lateral and left posterior calf this does not look better. There continues to be weeping edema necrotic surface tissue. More worrisome than this once again there is erythema below the wound involving the distal Achilles and heel suggestive of cellulitis. He is on his feet working most of the day of this is  not going well. We are changing his dressing twice a week to facilitate the drainage. 12/12/16; not much change in the overall dimensions of the large area on the left posterior calf. This is very inflamed looking. I gave him an. Doxycycline last week does not really seem to have helped. He found the wrap very painful indeed it seems to of dog into his legs superiorly and perhaps around the heel. He came in early today because the drainage had soaked through his dressings. 12/19/16- patient arrives for follow-up evaluation of his left lower extremity ulcers. He states that he is using his lymphedema pumps once daily when there is "no drainage". He  admits to not using his lipedema pumps while under current treatment. His blood sugars have been consistently between 150-200. 12/26/16; the patient is not using his compression pumps at home because of the wetness on his feet. I've advised him that I think it's important for him to use this daily. He finds his feet too wet, he can put a plastic bag over his legs while he is in the pumps. Otherwise I think will be in a vicious circle. We are using silver alginate to the major area on his left posterior calf 01/02/17; the patient's posterior left leg has further of all into 3 open wounds. All of them covered with a necrotic surface. He claims to be using his compression pumps once a day. His edema control is marginal. Continue with silver alginate 01/10/17; the patient's left posterior leg actually looks somewhat better. There is less edema, less erythema. Still has 3 open areas covered with a necrotic surface requiring debridement. He claims to be using his compression pumps once a day his edema control is better 01/17/17; the patient's left posterior calf look better last week when I saw him and his wrap was changed 2 days ago. He has noted increasing pain in the left heel and arrives today with much larger wounds extensive erythema extending down into the entire  heel area especially tender medially. He is not systemically unwell CBGs have been controlled no fever. Our intake nurse showed me limegreen drainage on his AVD pads. 01/24/17; his usual this patient responds nicely to antibiotics last week giving him Levaquin for presumed Pseudomonas. The whole entire posterior part of his leg is much better much less inflamed and in the case of his Achilles heel area much less tender. He has also had some epithelialization posteriorly there are still open areas here and still draining but overall considerably better 01/31/17- He has continue to tolerate the compression wraps. he states that he continues to use the lymphedema pumps daily, and can increase to twice daily on the weekends. He is voicing no complaints or concerns regarding his LLE ulcers 02/07/17-he is here for follow-up evaluation. He states that he noted some erythema to the left medial and anterior thigh, which he states is new as of yesterday. He is concerned about recurrent cellulitis. He states his blood sugars have been slightly elevated, this morning in the 180s 02/14/17; he is here for follow-up evaluation. When he was last here there was erythema superiorly from his posterior wound in his anterior thigh. He was prescribed Levaquin however a culture of the wound surface grew MRSA over the phone I changed him to doxycycline on Monday and things seem to be a lot better. 02/24/17; patient missed his appointment on Friday therefore we changed his nurse visit into a physician visit today. Still using silver alginate on the large area of the posterior left thigh. He isn't new area on the dorsal left second toe 03/03/17; actually better today although he admits he has not used his external compression pumps in the last 2 days or so because of work responsibilities over the weekend. 03/10/17; continued improvement. External compression pumps once a day almost all of his wounds have closed on the posterior left  calf. Better edema control 03/17/17; in general improved. He still has 3 small open areas on the lateral aspect of his left leg however most of the area on the posterior part of his leg is epithelialized. He has better edema control. He has an ABD pad under his  stocking on the right anterior lower leg although he did not let us look at that today. 03/24/17; patient arrives back in clinic today with no open areas however there are areas on the posterior left calf and anterior left calf that are less than 100% epithelialized. His edema is well controlled in the left lower leg. There is some pitting edema probably lymphedema in the left upper thigh. He uses compression pumps at home once per day. I tried to get him to do this twice a day although he is very reticent. 04/01/2017 -- for the last 2 days he's had significant redness, tenderness and weeping and came in for an urgent visit today. 04/07/17; patient still has 6 more days of doxycycline. He was seen by Dr. Meyer Russel last Wednesday for cellulitis involving the posterior aspect, lateral aspect of his Involving his heel. For the most part he is better there is less erythema and less weeping. He has been on his feet for 12 hours o2 over the weekend. Using his compression pumps once a day 04/14/17 arrives today with continued improvement. Only one area on the posterior left calf that is not fully epithelialized. He has intense bilateral venous inflammation associated with his chronic venous insufficiency disease and secondary lymphedema. We have been using silver alginate to the left posterior calf wound In passing he tells [place] today that the right leg but we have not seen in quite some time has an open area on it but he doesn't want [place] to look at this today states he will show this to [place] next week. 04/21/17; there is no open area on his left leg although he still reports some weeping edema. He showed [place] his right leg today which is the first time we've  seen this leg in a long time. He has a large area of open wound on the right leg anteriorly healthy granulation. Quite a bit of swelling in the right leg and some degree of venous inflammation. He told us about the right leg in passing last week but states that deterioration in the right leg really only happened over the weekend 04/28/17; there is no open area on the left leg although there is an irritated part on the posterior which is like a wrap injury. The wound on the right leg which was new from last week at least to [place] is a lot better. 05/05/17; still no open area on the left leg. Patient is using his new compression stocking which seems to be doing a good job of controlling the edema. He states he is using his compression pumps once per day. The right leg still has an open wound although it is better in terms of surface area. Required debridement. A lot of pain in the posterior right Achilles marked tenderness. Usually this type of presentation this patient gives concern for an active cellulitis 05/12/17; patient arrives today with his major wound from last week on the right lateral leg somewhat better. Still requiring debridement. He was using his compression stocking on the left leg however that is reopened with superficial wounds anteriorly he did not have an open wound on this leg previously. He is still using his juxta light's once daily at night. He cannot find the time to do this in the morning as he has to be at work by 7 AM 05/19/17; right lateral leg wound looks improved. No debridement required. The concerning area is on the left posterior leg which appears to almost have a subcutaneous hemorrhagic  component to it. We've been using silver alginate to all the wounds 05/26/17; the right lateral leg wound continues to look improved. However the area on the left posterior calf is a tightly adherent surface. [place] using silver alginate. Because of the weeping edema in his legs there is very  little good alternatives. 06/02/17; the patient left here last week looking quite good. Major wound on the left posterior calf and a small one on the right lateral calf. Both of these look satisfactory. He tells me that by Wednesday he had noted increased pain in the left leg and drainage. He called on Thursday and Friday to get an appointment here but we were blocked. He did not go to urgent care or his primary physician. He thinks he had a fever on Thursday but did not actually take his temperature. He has not been using his compression pumps on the left leg because of pain. I advised him to go to the emergency room today for IV antibiotics for stents of left leg cellulitis but he has refused I have asked him to take 2 days off work to keep his leg elevated and he has refused this as well. In view of this I'm going to call him and Augmentin and doxycycline. 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 However after a detailed discussion regarding the risk benefits and alternatives he refuses to get admitted to the hospital. With no other recourse I will continue him on oral antibiotics as before and hopefully he'll have his infectious disease consultation this week. 06/16/2017 -- the patient was seen today by the nurse practitioner at infectious disease Ms. Dixon. 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 He has also been advise Lotrimin cream locally. she also had wise IV antibiotics for his condition if it flares up 06/23/17; patient arrives today with drainage bilaterally although  the remaining wound on the left posterior calf after cleaning up today "highlighter yellow drainage" did not look too bad. Unfortunately he has had breakdown on the right anterior leg [previously this leg had not been open and he is using a black stocking] he went to see infectious disease and is been put on clindamycin 150 mg daily, I did not verify the dose although I'm not familiar with using clindamycin in this dosing range, perhaps for prophylaxisoo 06/27/17; I brought this patient back today to follow-up on the wound deterioration on the right lower leg together with surrounding cellulitis. I started him on doxycycline 4 days ago. This area looks better however he comes in today with intense cellulitis on the medial part of his left thigh. This is not have a wound in this area. Extremely tender. We've been using silver alginate to the wounds on the right lower leg left lower leg with bilateral 4 layer compression he is using his external compression pumps once a day 07/04/17; patient's left medial thigh cellulitis looks better. He has not been using his compression pumps as his insert said it was contraindicated with cellulitis. His right leg continues to make improvements all the wounds  are still open. We only have one remaining wound on the left posterior calf. Using silver alginate to all open areas. He is on doxycycline which I started a week ago and should be finishing I gave him Augmentin after Thursday's visit for the severe cellulitis on the left medial thigh which fortunately looks better 07/14/17; the patient's left medial thigh cellulitis has resolved. The cellulitis in his right lower calf on the right also looks better. All of his wounds are stable to improved we've been using silver alginate he has completed the antibiotics I have given him. He has clindamycin 150 mg once a day prescribed by infectious disease for prophylaxis, I've advised him to start this now. We have been using  bilateral Unna boots over silver alginate to the wound areas 07/21/17; the patient is been to see infectious disease who noted his recurrent problems with cellulitis. He was not able to tolerate prophylactic clindamycin therefore he is on amoxicillin 500 twice a day. He also had a second daily dose of Lasix added By Dr. Oneta Rack but he is not taking this. Nor is he being completely compliant with his compression pumps a especially not this week. He has 2 remaining wounds one on the right posterior lateral lower leg and one on the left posterior medial lower leg. 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 The patient has Unna boots bilaterally. 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 He has bilateral Unna boots for compression and silver alginate to his wounds. Arrives today with his legs looking as good as I have seen him in quite some time. Not surprisingly his wounds look better as well with improvement on the right lateral leg venous insufficiency wound and also the left medial leg. He is still using the compression pumps once a day 08/11/17; both legs appear to be doing better wounds on the right lateral and left medial legs look better. Skin on the right leg quite good. He is been using silver alginate as the primary dressing. I'm going to use Anasept gel calcium alginate and maintain all the secondary dressings 08/18/17; the patient continues to actually do quite well. The area on his right lateral leg is just about closed the left medial also looks better although it is still moist in this area. His edema is well controlled we have been using Anasept gel with calcium alginate and the usual secondary dressings, 4 layer compression and once daily use of his compression pumps  "always been able to manage 09/01/17; the patient continues to do reasonably well in spite of his trip to [place]. The area on the right lateral leg is epithelialized. Left is much better but still open. He has more edema and more chronic erythema on the left leg [venous inflammation] 09/08/17; he arrives today with no open wound on the right lateral leg and decently controlled edema. Unfortunately his left leg is not nearly as in his good situation as last week.he apparently had increasing edema starting on Saturday. He edema soaked through into his foot so used a plastic bag to walk around his home. The area on the medial right leg which was his open area is about the same however he has lost surface epithelium on the left lateral which is new and he has significant pain  in the Achilles area of the left foot. He is already on amoxicillin chronically for prophylaxis of cellulitis in the left leg 09/15/17; he is completed a week of doxycycline and the cellulitis in the left posterior leg and Achilles area is as usual improved. He still has a lot of edema and fluid soaking through his dressings. There is no open wound on the right leg. He saw infectious disease NP today 09/22/17;As usual 1 we transition him from our compression wraps to his stockings things did not go well. He has several small open areas on the right leg. He states this was caused by the compression wrap on his skin although he did not wear this with the stockings over them. He has several superficial areas on the left leg medially laterally posteriorly. He does not have any evidence of active cellulitis especially involving the left Achilles The patient is traveling from [facility] Saturday going to [facility] He states he isn't attempting to get an appointment with a heel objects wound center there to change his dressings. I am not completely certain whether this will work 10/06/17; the patient came in on Friday for a nurse visit  and the nurse reported that his legs actually look quite good. He arrives in clinic today for his regular follow-up visit. He has a new wound on his left third toe over the PIP probably caused by friction with his footwear. He has small areas on the left leg and a very superficial but epithelialized area on the right anterior lateral lower leg. Other than that his legs look as good as I've seen him in quite some time. We have been using silver alginate Review of systems; no chest pain no shortness of breath other than this a 10 point review of systems negative 10/20/17; seen by Dr. Meyer Russel last week. He had taken some antibiotics [doxycycline] that he had left over. Dr. Meyer Russel thought he had candida infection and declined to give him further antibiotics. He has a small wound remaining on the right lateral leg several areas on the left leg including a larger area on the left posterior several left medial and anterior and a small wound on the left lateral. The area on the left dorsal third toe looks a lot better. ROS; Gen.; no fever, respiratory no cough no sputum Cardiac no chest pain other than this 10 point review of system is negative 10/30/17; patient arrives today having fallen in the bathtub 3 days ago. It took him a while to get up. He has pain and maceration in the wounds on his left leg which have deteriorated. He has not been using his pumps he also has some maceration on the right lateral leg. 11/03/17; patient continues to have weeping edema especially in the left leg. This saturates his dressings which were just put on on 12/27. As usual the doxycycline seems to take care of the cellulitis on his lower leg. He is not complaining of fever, chills, or other systemic symptoms. He states his leg feels a lot better on the doxycycline I gave him empirically. He also apparently gets injections at his primary doctor's officeo Rocephin for cellulitis prophylaxis. I didn't ask him about his  compression pump compliance today I think that's probably marginal. Arrives in the clinic with all of his dressings primary and secondary macerated full of fluid and he has bilateral edema 11/10/17; the patient's right leg looks some better although there is still a cluster of wounds on the right lateral. The  left leg is inflamed with almost circumferential skin loss medially to laterally although we are still maintaining anteriorly. He does not have overt cellulitis there is a lot of drainage. He is not using compression pumps. We have been using silver alginate to the wound areas, there are not a lot of options here 11/17/17; the patient's right leg continues to be stable although there is still open wounds, better than last week. The inflammation in the left leg is better. Still loss of surface layer epithelium especially posteriorly. There is no overt cellulitis in the amount of edema and his left leg is really quite good, tells me he is using his compression pumps once a day. 11/24/17; patient's right leg has a small superficial wound laterally this continues to improve. The inflammation in the left leg is still improving however we have continuous surface layer epithelial loss posteriorly. There is no overt cellulitis in the amount of edema in both legs is really quite good. He states he is using his compression pumps on the left leg once a day for 5 out of 7 days 12/01/17; very small superficial areas on the right lateral leg continue to improve. Edema control in both legs is better today. He has continued loss of surface epithelialization and left posterior calf although I think this is better. We have been using silver alginate with large number of absorptive secondary dressings 4 layer on the left Unna boot on the right at his request. He tells me he is using his compression pumps once a day 12/08/17; he has no open area on the right leg is edema control is good here. ooOn the left leg however he  has marked erythema and tenderness breakdown of skin. He has what appears to be a wrap injury just distal to the popliteal fossa. 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 There was swelling erythema and breakdown in the right leg. He has much worse skin breakdown in the left leg as well multiple open areas medially and posteriorly as well as laterally. He tells me he has been using his compression pumps but tells me he feels that the drainage out of his leg is worse when he uses a compression pumps. T be fair to him he is been saying this o for a while however I don't know that I have really been listening to this. I wonder if the compression pumps are working properly 12/22/17;. Once again he arrives with severe erythema, weeping edema from the left greater than right leg. Noncompliance with compression pumps. New this visit he is complaining of pain on the lateral aspect of the right leg and the medial aspect of his right thigh. He apparently saw his cardiologist Dr. [name] who was ordered an echocardiogram area and I think this is a step in the right direction 12/25/17; started his doxycycline Monday night. There is still intense erythema of the right leg especially in the anterior thigh although there is less tenderness. The erythema around the wound on the right lateral calf also is less tender. He still complaining of pain in the left heel. His wounds are about the same right lateral left medial left lateral. Superficial but certainly not close to closure. He denies being  systemically unwell no fever chills no abdominal pain no diarrhea 12/29/17; back in follow-up of his extensive right calf and right thigh cellulitis. I  added amoxicillin to cover possible doxycycline resistant strep. This seems to of done the trick he is in much less pain there is much less erythema and swelling. He has his echocardiogram at 11:00 this morning. X-ray of the left heel was also negative. 01/05/18; the patient arrived with his edema under much better control. Now that he is retired he is able to use his compression pumps daily and sometimes twice a day per the patient. He has a wound on the right leg the lateral wound looks better. Area on the left leg also looks a lot better. He has no evidence of cellulitis in his bilateral thighs I had a quick peak at his echocardiogram. He is in normal ejection fraction and normal left ventricular function. He has moderate pulmonary hypertension moderately reduced right ventricular function. One would have to wonder about chronic sleep apnea although he says he doesn't snore. He'll review the echocardiogram with his cardiologist. 01/12/18; the patient arrives with the edema in both legs under exemplary control. He is using his compression pumps daily and sometimes twice daily. His wound on the right lateral leg is just about closed. He still has some weeping areas on the posterior left calf and lateral left calf although everything is just about closed here as well. I have spoken with Aldean Baker who is the patient's nurse practitioner and infectious disease. She was concerned that the patient had not understood that the parenteral penicillin injections he was receiving for cellulitis prophylaxis was actually benefiting him. I don't think the patient actually saw that I would tend to agree we were certainly dealing with less infections although he had a serious one last month. 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 He is healed. He'll be placed in juxtalite compression wraps and increase his lymphedema pumps to twice daily. We will follow up again next week  to ensure there are no issues with the new regiment. 01/20/18-he is here for evaluation of bilateral lower extremity weeping edema. Yesterday he was placed in compression wrap to the right lower extremity and compression stocking to left lower shrubbery. He states he uses lymphedema pumps last night and again this morning and noted a blister to the left lower extremity. On exam he was noted to have drainage to the right lower extremity. He will be placed in Unna boots bilaterally and follow-up next week 01/26/18; patient was actually discharged a week ago to his own juxta light stockings only to return the next day with bilateral lower extremity weeping edema.he was placed in bilateral Unna boots. He arrives today with pain in the back of his left leg. There is no open area on the right leg however there is a linear/wrap injury on the left leg and weeping edema on the left leg posteriorly. I spoke with infectious disease about 10 days ago. They were disappointed that the patient elected to discontinue prophylactic intramuscular penicillin shots as they felt it was particularly beneficial in reducing the frequency of his cellulitis. I discussed this with the patient today. He does not share this view. He'll definitely need antibiotics today. Finally he is traveling to [place] and trauma leaving this Saturday and returning a week later and he does not travel with his pumps. He is going by car 01/30/18; patient was seen 4 days ago and brought back  in today for review of cellulitis in the left leg posteriorly. I put him on amoxicillin this really hasn't helped as much as I might like. He is also worried because he is traveling to [facility] trauma by car. Finally we will be rewrapping him. There is no open area on the right leg over his left leg has multiple weeping areas as usual 02/09/18; The same wrap on for 10 days. He did not pick up the last doxycycline I prescribed for him. He apparently took 4 days  worth he already had. There is nothing open on his right leg and the edema control is really quite good. He's had damage in the left leg medially and laterally especially probably related to the prolonged use of Unna boots 02/12/18; the patient arrived in clinic today for a nurse visit/wrap change. He complained of a lot of pain in the left posterior calf. He is taking doxycycline that I previously prescribed for him. Unfortunately even though he used his stockings and apparently used to compression pumps twice a day he has weeping edema coming out of the lateral part of his right leg. This is coming from the lower anterior lateral skin area. 02/16/18; the patient has finished his doxycycline and will finish the amoxicillin 2 days. The area of cellulitis in the left calf posteriorly has resolved. He is no longer having any pain. He tells me he is using his compression pumps at least once a day sometimes twice. 02/23/18; the patient finished his doxycycline and Amoxil last week. On Friday he noticed a small erythematous circle about the size of a quarter on the left lower leg just above his ankle. This rapidly expanded and he now has erythema on the lateral and posterior part of the thigh. This is bright red. Also has an area on the dorsal foot just above his toes and a tender area just below the left popliteal fossa. He came off his prophylactic penicillin injections at his own insistence one or 2 months ago. This is obviously deteriorated since then 03/02/18; patient is on doxycycline and Amoxil. Culture I did last week of the weeping area on the back of his left calf grew group B strep. I have therefore renewed the amoxicillin 500 3 times a day for a further week. He has not been systemically unwell. Still complaining of an area of discomfort right under his left popliteal fossa. There is no open wound on the right leg. He tells me that he is using his pumps twice a day on most days 03/09/18; patient  arrives in clinic today completing his amoxicillin today. The cellulitis on his left leg is better. Furthermore he tells me that he had intramuscular penicillin shots that his primary care office today. However he also states that the wrap on his right leg fell down shortly after leaving clinic last week. He developed a large blister that was present when he came in for a nurse visit later in the week and then he developed intense discomfort around this area.He tells me he is using his compression pumps 03/16/18; the patient has completed his doxycycline. The infectious part of this/cellulitis in the left heel area left popliteal area is a lot better. He has 2 open areas on the right calf. Still areas on the left calf but this is a lot better as well. 03/24/18; the patient arrives complaining of pain in the left popliteal area again. He thinks some of this is wrap injury. He has no open area on  the right leg and really no open area on the left calf either except for the popliteal area. He claims to be compliant with the compression pumps 03/31/18; I gave him doxycycline last week because of cellulitis in the left popliteal area. This is a lot better although the surface epithelium is denuded off and response to this. He arrives today with uncontrolled edema in the right calf area as well as a fingernail injury in the right lateral calf. There is only a few open areas on the left 04/06/18; I gave him amoxicillin doxycycline over the last 2 weeks that the amoxicillin should be completing currently. He is not complaining of any pain or systemic symptoms. The only open areas see has is on the right lateral lower leg paradoxically I cannot see anything on the left lower leg. He tells me he is using his compression pumps twice a day on most days. Silver alginate to the wounds that are open under 4 layer compression 04/13/18; he completed antibiotics and has no new complaints. Using his compression pumps. Silver  alginate that anything that's opened 04/20/18; he is using his compression pumps religiously. Silver alginate 4 layer compression anything that's opened. He comes in today with no open wounds on the left leg but 3 on the right including a new one posteriorly. He has 2 on the right lateral and one on the right posterior. He likes Unna boots on the right leg for reasons that aren't really clear we had the usual 4 layer compression on the left. It may be necessary to move to the 4 layer compression on the right however for now I left them in the Unna boots 04/27/18; he is using his compression pumps at least once a day. He has still the wounds on the right lateral calf. The area right posteriorly has closed. He does not have an open wound on the left under 4 layer compression however on the dorsal left foot just proximal to the toes and the left third toe 2 small open areas were identified 05/11/18; he has not uses compression pumps. The areas on the right lateral calf have coalesced into one large wound necrotic surface. On the left side he has one small wound anteriorly however the edema is now weeping out of a large part of his left leg. He says he wasn't using his pumps because of the weeping fluid. I explained to him that this is the time he needs to pump more 05/18/18; patient states he is using his compression pumps twice a day. The area on the right lateral large wound albeit superficial. On the left side he has innumerable number of small new wounds on the left calf particularly laterally but several anteriorly and medially. All these appear to have healthy granulated base these look like the remnants of blisters however they occurred under compression. The patient arrives in clinic today with his legs somewhat better. There is certainly less edema, less multiple open areas on the left calf and the right anterior leg looks somewhat better as well superficial and a little smaller. However he relates  pain and erythema over the last 3-4 days in the thigh and I looked at this today. He has not been systemically unwell no fever no chills no change in blood sugar values 05/25/18; comes in today in a better state. The severe cellulitis on his left leg seems better with the Keflex. Not as tender. He has not been systemically unwell ooHard to find an open wound  on the left lower leg using his compression pumps twice a day ooThe confluent wounds on his right lateral calf somewhat better looking. These will ultimately need debridement I didn't do this today. 06/01/18; the severe cellulitis on the left anterior thigh has resolved and he is completed his Keflex. ooThere is no open wound on the left leg however there is a superficial excoriation at the base of the third toe dorsally. Skin on the bottom of his left foot is macerated looking. ooThe left the wounds on the lateral right leg actually looks some better although he did require debridement of the top half of this wound area with an open curet 06/09/18 on evaluation today patient appears to be doing poorly in regard to his right lower extremity in particular this appears to likely be infected he has very thick purulent discharge along with a bright green tent to the discharge. This makes me concerned about the possibility of pseudomonas. He's also having increased discomfort at this point on evaluation. Fortunately there does not appear to be any evidence of infection spreading to the other location at this time. 06/16/18 on evaluation today patient appears to actually be doing fairly well. His ulcer has actually diminished in size quite significantly at this point which is good news. Nonetheless he still does have some evidence of infection he did see infectious disease this morning before coming here for his appointment. I did review the results of their evaluation and their note today. They did actually have him discontinue the Cipro and initiate  treatment with linezolid at this time. He is doing this for the next seven days and they recommended a follow-up in four months with them. He is the keep a log of the need for intermittent antibiotic therapy between now and when he falls back up with infectious disease. This will help them gaze what exactly they need to do to try and help them out. 06/23/18; the patient arrives today with no open wounds on the left leg and left third toe healed. He is been using his compression pumps twice a day. On the right lateral leg he still has a sizable wound but this is a lot better than last time I saw this. 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 Has been using silver alginate under 4 layer compression 06/30/18; the only open wound he has is on the right lateral leg and this looks healthy. No debridement is required. We have been using silver alginate. He does not have an open wound on the left leg. There is apparently some drainage from the dorsal proximal third toe on the left although I see no open wound here. 07/03/18 on evaluation today patient was actually here just for a nurse visit rapid change. However when he was here on Wednesday for his rat change due to having been healed on the left and then developing blisters we initiated the wrap again knowing that he would be back today for [place] to reevaluate and see were at. Unfortunately he has developed some cellulitis into the proximal portion of his right lower extremity even into the region of his thigh. 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 He was placed on one as what at that point. Nonetheless he is done with that and has been tolerating it well otherwise. Doxycycline which in the past really  did not seem to be effective for him. Nonetheless I think the best option may be for [place] to definitely  reinitiate the antibiotics for a longer period of time. 07/07/18; since I last saw this patient a week ago he has had a difficult time. At that point he did not have an open wound on his left leg. We transitioned him into juxta light stockings. He was apparently in the clinic the next day with blisters on the left lateral and left medial lower calf. He also had weeping edema fluid. He was put back into a compression wrap. He was also in the clinic on Friday with intense erythema in his right thigh. Per the patient he was started on Bactrim however that didn't work at all in terms of relieving his pain and swelling. He has taken 3 doxycycline that he had left over from last time and that seems to of helped. He has blistering on the right thigh as well. 07/14/18; the erythema on his right thigh has gotten better with doxycycline that he is finishing. 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 Presumably this cellulitis in the thigh was not related to that although I think this is a bit concerning going forward. He still has an area on the right lateral calf the blister on the right medial calf just below the knee that was discussed above. On the left 2 small open areas left medial and left lateral. Edema control is adequate. He is using his compression pumps twice a day 07/20/18; continued improvement in the condition of both legs especially the edema in his bilateral thighs. He tells me he is been losing weight through a combination of diet and exercise. He is using his compression pumps twice a day. So overall she made to the remaining wounds 07/27/2018; continued improvement in condition of both legs. His edema is well controlled. The area on the right lateral leg is just about closed he had one blisters show up on the medial left upper calf. We have him in 4 layer compression. He is going on a 10-day trip to [place], [place]  and [place]. He will be driving. He wants to wear Unna boots because of the lessening amount of constriction. He will not use compression pumps while he is away 08/05/18 on evaluation today patient actually appears to be doing decently well all things considered in regard to his bilateral lower extremities. The worst ulcer is actually only posterior aspect of his left lower extremity with a four layer compression wrap cut into his leg a couple weeks back. He did have a trip and actually had [employer] for the trip that he is worn since he was last here. Nonetheless he feels like the [employer] actually do better for him his swelling is up a little bit but he also with his trip was not taking his Lasix on a regular set schedule like he was supposed to be. He states that obviously the reason being that he cannot drive and keep going without having to urinate too frequently which makes it difficult. He did not have his pumps with him while he was away either which I think also maybe playing a role here too. 08/13/2018; the patient only has a small open wound on the right lateral calf which is a big improvement in the last month or 2. He also has the area posteriorly  just below the posterior fossa on the left which I think was a wrap injury from several weeks ago. He has no current evidence of cellulitis. He tells me he is back into his compression pumps twice a day. He also tells me that while he was at the laundromat somebody stole a section of his extremitease stockings 08/20/2018; back in the clinic with a much improved state. He only has small areas on the right lateral mid calf which is just about healed. This was is more substantial area for quite a prolonged period of time. He has a small open area on the left anterior tibia. The area on the posterior calf just below the popliteal fossa is closed today. He is using his compression pumps twice a day 08/28/2018; patient has no open wound  on the right leg. He has a smattering of open areas on the calf with some weeping lymphedema. More problematically than that it looks as though his wraps of slipped down in his usual he has very angry upper area of edema just below the right medial knee and on the right lateral calf. He has no open area on his feet. The patient is traveling to [facility] next week. I will send him in an antibiotic. We will continue to wrap the right leg. We ordered extremitease stockings for him last week and I plan to transition the right leg to a stocking when he gets home which will be in 10 days time. As usual he is very reluctant to take his pumps with him when he travels 09/07/2018; patient returns from [facility]. He shows me a picture of his left leg in the mid part of his trip last week with intense fire engine erythema. The picture look bad enough I would have considered sending him to the hospital. Instead he went to the wound care center in [facility]. They did not prescribe him antibiotics but he did take some doxycycline he had leftover from a previous visit. I had given him trimethoprim sulfamethoxazole before he left this did not work according to the patient. This is resulted in some improvement fortunately. He comes back with a large wound on the left posterior calf. Smaller area on the left anterior tibia. Denuded blisters on the dorsal left foot over his toes. Does not have much in the way of wounds on the right leg although he does have a very tender area on the right posterior area just below the popliteal fossa also suggestive of infection. He promises me he is back on his pumps twice a day 09/15/2018; the intense cellulitis in his left lower calf is a lot better. The wound area on the posterior left calf is also so better. However he has reasonably extensive wounds on the dorsal aspect of his second and third toes and the proximal foot just at the base of the toes. There is  nothing open on the right leg 09/22/2018; the patient has excellent edema control in his legs bilaterally. He is using his external compression pumps twice a day. He has no open area on the right leg and only the areas in the left foot dorsally second and third toe area on the left side. He does not have any signs of active cellulitis. 10/06/2018; the patient has good edema control bilaterally. He has no open wound on the right leg. There is a blister in the posterior aspect of his left calf that we had to deal with today. He is using  his compression pumps twice a day. There is no signs of active cellulitis. We have been using silver alginate to the wound areas. He still has vulnerable areas on the base of his left first second toes dorsally He has a his extremities stockings and we are going to transition him today into the stocking on the right leg. He is cautioned that he will need to continue to use the compression pumps twice a day. If he notices uncontrolled edema in the right leg he may need to go to 3 times a day. 10/13/2018; the patient came in for a nurse check on Friday he has a large flaccid blister on the right medial calf just below the knee. We unroofed this. He has this and a new area underneath the posterior mid calf which was undoubtedly a blister as well. He also has several small areas on the right which is the area we put his extremities stocking on. 10/19/2018; the patient went to see infectious disease this morning I am not sure if that was a routine follow-up in any case the doxycycline I had given him was discontinued and started on linezolid. He has not started this. It is easy to look at his left calf and the inflammation and think this is cellulitis however he is very tender in the tissue just below the popliteal fossa and I have no doubt that there is infection going on here. He states the problem he is having is that with the compression pumps the edema goes down and then  starts walking the wrap falls down. We will see if we can adhere this. He has 1 or 2 minuscule open areas on the right still areas that are weeping on the posterior left calf, the base of his left second and third toes 10/26/18; back today in clinic with quite of skin breakdown in his left anterior leg. This may have been infection the area below the popliteal fossa seems a lot better however tremendous epithelial loss on the left anterior mid tibia area over quite inexpensive tissue. He has 2 blisters on the right side but no other open wound here. 10/29/2018; came in urgently to see [place] today and we worked him in for review. He states that the 4 layer compression on the right leg caused pain he had to cut it down to roughly his mid calf this caused swelling above the wrap and he has blisters and skin breakdown today. As a result of the pain he has not been using his pumps. Both legs are a lot more edematous and there is a lot of weeping fluid. 11/02/18; arrives in clinic with continued difficulties in the right leg> left. Leg is swollen and painful. multiple skin blisters and new open areas especially laterally. He has not been using his pumps on the right leg. 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 He is not systemically unwell. 11/09/2018; the patient claims he is being compliant with his pumps. He is finished the doxycycline I gave him last week. Culture I did of the wound on the right lateral leg showed a few very resistant methicillin staph aureus. This was resistant to doxycycline. Nevertheless he states the pain in the leg is a lot better which makes me wonder if the cultured organism was not really what was causing the problem nevertheless this is a very dangerous organism to be culturing out of any wound. His right leg is still a lot larger than  the left. He is using an Radio broadcast assistant on this area, he blames a 4-layer compression for causing the  original skin breakdown which I doubt is true however I cannot talk him out of it. We have been using silver alginate to all of these areas which were initially blisters 11/16/2018; patient is being compliant with his external compression pumps at twice a day. Miraculously he arrives in clinic today with absolutely no open wounds. He has better edema control on the left where he has been using 4 layer compression versus wound of wounds on the right and I pointed this out to him. There is no inflammation in the skin in his lower legs which is also somewhat unusual for him. There is no open wounds on the dorsal left foot. He has extremitease stockings at home and I have asked him to bring these in next week. 11/25/18 patient's lower extremity on examination today on the left appears for the most part to be wound free. He does have an open wound on the lateral aspect of the right lower extremity but this is minimal compared to what I've seen in past. He does request that we go ahead and wrap the left leg as well even though there's nothing open just so hopefully it will not reopen in short order. 1/28; patient has superficial open wounds on the right lateral calf left anterior calf and left posterior calf. His edema control is adequate. 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 We have been using silver alginate as the primary dressing. He claims compliance with his compression pumps 2/4; patient has superficial open wounds on numerous areas of his left calf and again one on the left dorsal foot. The areas on the right lateral calf have healed. The cellulitis that I gave him doxycycline for last week is also resolved this was mostly on the left anterior calf just below the tibial tuberosity. His edema looks fairly well-controlled. He tells me he went to see his primary doctor today and had blood work ordered 2/11; once  again he has several open areas on the left calf left tibial area. Most of these are small and appear to have healthy granulation. He does not have anything open on the right. The edema and control in his thighs is pretty good which is usually a good indication he has been using his pumps as requested. 2/18; he continues to have several small areas on the left calf and left tibial area. Most of these are small healthy granulation. We put him in his stocking on the right leg last week and he arrives with a superficial open area over the right upper tibia and a fairly large area on the right lateral tibia in similar condition. His edema control actually does not look too bad, he claims to be using his compression pumps twice a day 2/25. Continued small areas on the left calf and left tibial area. New areas especially on the right are identified just below the tibial tuberosity and on the right upper tibia itself. There are also areas of weeping edema fluid even without an obvious wound. He does not have a considerable degree of lymphedema but clearly there is more edema here than his skin can handle. He states he is using the pumps twice a day. We have an Unna boot on the right and 4 layer compression on the left. 3/3; he continues to  have an area on the right lateral calf and right posterior calf just below the popliteal fossa. There is a fair amount of tenderness around the wound on the popliteal fossa but I did not see any evidence of cellulitis, could just be that the wrap came down and rubbed in this area. ooHe does not have an open area on the left leg however there is an area on the left dorsal foot at the base of the third toe ooWe have been using silver alginate to all wound areas 3/10; he did not have an open area on his left leg last time he was here a week ago. T oday he arrives with a horizontal wound just below the tibial tuberosity and an area on the left lateral calf. He has intense  erythema and tenderness in this area. The area is on the right lateral calf and right posterior calf better than last week. We have been using silver alginate as usual 3/18 - Patient returns with 3 small open areas on left calf, and 1 small open area on right calf, the skin looks ok with no significant erythema, he continues the UNA boot on right and 4 layer compression on left. The right lateral calf wound is closed , the right posterior is small area. we will continue silver alginate to the areas. 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 He is been tolerating the dressing changes without complication. Fortunately she has made excellent improvement in regard to the overall status of his wounds. Unfortunately every time we cease wrapping him he ends up reopening in causing more significant issues at that point. Again I'm unsure of the best direction to take although I think the lymphedema clinic may be appropriate for him. 02/03/19 on evaluation today patient appears to be doing well in regard to the wounds that we saw him for last week unfortunately he has a new area on the proximal portion of his right medial/posterior lower extremity where the wrap somewhat slowed down and caused swelling and a blister to rub and open. Unfortunately this is the only opening that he has on either leg at this point. 02/17/19 on evaluation today patient's bilateral lower extremities appear to be doing well. He still completely healed in regard to the left lower extremity. In regard to the right lower extremity the area where the wrap and slid down and caused the blister still seems to be slightly open although this is dramatically better than during the last evaluation two weeks ago. I'm very pleased with the way this stands  overall. 03/03/19 on evaluation today patient appears to be doing well in regard to his right lower extremity in general although he did have a new blister open this does not appear to be showing any evidence of active infection at this time. Fortunately there's No fevers, chills, nausea, or vomiting noted at this time. Overall I feel like he is making good progress it does feel like that the right leg will we perform the [employer] seems to do with a bit better than three layer wrap on the left which slid down on him. We may switch to doing bilateral in the book wraps. 5/4; I have not seen Mr. Nibert in quite some time. According to our case manager he did not have an open  wound on his left leg last week. He had 1 remaining wound on the right posterior medial calf. He arrives today with multiple openings on the left leg probably were blisters and/or wrap injuries from Unna boots. I do not think the Unna boot's will provide adequate compression on the left. I am also not clear about the frequency he is using the compression pumps. 03/17/19 on evaluation today patient appears to be doing excellent in regard to his lower extremities compared to last week's evaluation apparently. He had gotten significantly worse last week which is unfortunate. The [employer] wrap on the left did not seem to do very well for him at all and in fact it didn't control his swelling significantly enough he had an additional outbreak. Subsequently we go back to the four layer compression wrap on the left. This is good news. At least in that he is doing better and the wound seem to be killing him. He still has not heard anything from the lymphedema clinic. 03/24/19 on evaluation today patient actually appears to be doing much better in regard to his bilateral lower Trinity as compared to last week when I saw him. Fortunately there's no signs of active infection at this time. He has been tolerating the dressing changes without  complication. Overall I'm extremely pleased with the progress and appearance in general. 04/07/19 on evaluation today patient appears to be doing well in regard to his bilateral lower extremities. His swelling is significantly down from where it was previous. With that being said he does have a couple blisters still open at this point but fortunately nothing that seems to be too severe and again the majority of the larger openings has healed at this time. 04/14/19 on evaluation today patient actually appears to be doing quite well in regard to his bilateral lower extremities in fact I'm not even sure there's anything significantly open at this time at any site. Nonetheless he did have some trouble with these wraps where they are somewhat irritating him secondary to the fact that he has noted that the graph wasn't too close down to the end of this foot in a little bit short as well up to his knee. Otherwise things seem to be doing quite well. 04/21/19 upon evaluation today patient's wound bed actually showed evidence of being completely healed in regard to both lower extremities which is excellent news. There does not appear to be any signs of active infection which is also good news. I'm very pleased in this regard. No fevers, chills, nausea, or vomiting noted at this time. 04/28/19 on evaluation today patient appears to be doing a little bit worse in regard to both lower extremities on the left mainly due to the fact that when he went infection disease the wrap was not wrapped quite high enough he developed a blister above this. On the right he is a small open area of nothing too significant but again this is continuing to give him some trouble he has been were in the Velcro compression that he has at home. 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 He's been tolerating the dressing changes without complication. Fortunately there's no signs of  active infection at this time. No fevers, chills, nausea, or vomiting noted at this time. We have been trying to get an appointment with her lymphedema clinic in [facility] but unfortunately nobody can get them on phone with not been able to  even fax information over the patient likewise is not been able to get in touch with them. Overall I'm not sure exactly what's going on here with to reach out again today. 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 Still having a lot of drainage unfortunately. He tells me especially in the left but even on the right. There's no signs of active infection which is good news we've been using so ratcheted up to this point. 05/19/19 on evaluation today patient actually appears to be doing quite well with regard to his left lower extremity which is great news. Fortunately in regard to the right lower extremity has an issues with his wrap and he subsequently did remove this from what I'm understanding. Nonetheless long story short is what he had rewrapped once he removed it subsequently had maggots underneath this wrap whenever he came in for evaluation today. With that being said they were obviously completely cleaned away by the nursing staff. The visit today which is excellent news. However he does appear to potentially have some infection around the right ankle region where the maggots were located as well. He will likely require anabiotic therapy today. 05/26/19 on evaluation today patient actually appears to be doing much better in regard to his bilateral lower extremities. I feel like the infection is under much better control. With that being said there were maggots noted when the wrap was removed yet again today. Again this could have potentially been left over from previous although at this time there does not appear to be any signs of significant drainage there  was obviously on the wrap some drainage as well this contracted gnats or otherwise. Either way I do not see anything that appears to be doing worse in my pinion and in fact I think his drainage has slowed down quite significantly likely mainly due to the fact to his infection being under better control. 06/02/2019 on evaluation today patient actually appears to be doing well with regard to his bilateral lower extremities there is no signs of active infection at this time which is great news. With that being said he does have several open areas more so on the right than the left but nonetheless these are all significantly better than previously noted. 06/09/2019 on evaluation today patient actually appears to be doing well. His wrap stayed up and he did not cause any problems he had more drainage on the right compared to the left but overall I do not see any major issues at this time which is great news. 06/16/2019 on evaluation today patient appears to be doing excellent with regard to his lower extremities the only area that is open is a new blister that can have opened as of today on the medial ankle on the left. Other than this he really seems to be doing great I see no major issues at this point. 06/23/2019 on evaluation today patient appears to be doing quite well with regard to his bilateral lower extremities. In fact he actually appears to be almost completely healed there is a small area of weeping noted of the right lower extremity just above the ankle. Nonetheless fortunately there is no signs of active infection at this time which is good news. No fevers, chills, nausea, vomiting, or diarrhea. 8/24; the patient arrived for a nurse visit today but complained of very significant pain in the left leg and therefore I was asked to  look at this. Noted that he did not have an open area on the left leg last week nevertheless this was wrapped. The patient states that he is not been able to put his  compression pumps on the left leg because of the discomfort. He has not been systemically unwell 06/30/2019 on evaluation today patient unfortunately despite being excellent last week is doing much worse with regard to his left lower extremity today. In fact he had to come in for a nurse on Monday where his left leg had to be rewrapped due to excessive weeping Dr. Leanord Hawking placed him on doxycycline at that point. Fortunately there is no signs of active infection Systemically at this time which is good news. 07/07/2019 in regard to the patient's wounds today he actually seems to be doing well with his right lower extremity there really is nothing open or draining at this point this is great news. Unfortunately the left lower extremity is given him additional trouble at this time. There does not appear to be any signs of active infection nonetheless he does have a lot of edema and swelling noted at this point as well as blistering all of which has led to a much more poor appearing leg at this time compared to where it was 2 weeks ago when it was almost completely healed. Obviously this is a little discouraging for the patient. He is try to contact the lymphedema clinic in [place] he has not been able to get through to them. 07/14/2019 on evaluation today patient actually appears to be doing slightly better with regard to his left lower extremity ulcers. Overall I do feel like at least at the top of the wrap that we have been placing this area has healed quite nicely and looks much better. The remainder of the leg is showing signs of improvement. Unfortunately in the thigh area he still has an open region on the left and again on the right he has been utilizing just a Band-Aid on an area that also opened on the thigh. Again this is an area that were not able to wrap although we did do an Ace wrap to provide some compression that something that obviously is a little less effective than the compression wraps  we have been using on the lower portion of the leg. He does have an appointment with the lymphedema clinic in [facility] on Friday. 07/21/2019 on evaluation today patient appears to be doing better with regard to his lower extremity ulcers. He has been tolerating the dressing changes without complication. Fortunately there is no signs of active infection at this time. No fevers, chills, nausea, vomiting, or diarrhea. I did receive the paperwork from the physical therapist at the lymphedema clinic in [place]. Subsequently I signed off on that this morning and sent that back to him for further progression with the treatment plan. 07/28/2019 on evaluation today patient appears to be doing very well with regard to his right lower extremity where I do not see any open wounds at this point. Fortunately he is feeling great as far as that is concerned as well. In regard to the left lower extremity he has been having issues with still several areas of weeping and edema although the upper leg is doing better his lower leg still I think is going require the compression wrap at this time. No fevers, chills, nausea, vomiting, or diarrhea. 08/04/2019 on evaluation today patient unfortunately is having new wounds on the right lower extremity. Again we have been  using Unna boot wrap on that side. We switched him to using his juxta lite wrap at home. With that being said he tells me he has been using it although his legs extremely swollen and to be honest really does not appear that he has been. I cannot know that for sure however. Nonetheless he has multiple new wounds on the right lower extremity at this time. Obviously we will have to see about getting this rewrapped for him today. 08/11/2019 on evaluation today patient appears to be doing fairly well with regard to his wounds. He has been tolerating the dressing changes including the compression wraps without complication. He still has a lot of edema in his  upper thigh regions bilaterally he is supposed to be seeing the lymphedema clinic on the 15th of this month once his wraps arrive for the upper part of his legs. 08/18/2019 on evaluation today patient appears to be doing well with regard to his bilateral lower extremities at this point. He has been tolerating the dressing changes without complication. Fortunately there is no signs of active infection which is also good news. He does have a couple weeping areas on the first and second toe of the right foot he also has just a small area on the left foot upper leg and a small area on the left lower leg but overall he is doing quite well in my opinion. He is supposed to be getting his wraps shortly in fact tomorrow and then subsequently is seeing the lymphedema clinic next Wednesday on the 21st. Of note he is also leaving on the 25th to go on vacation for a week to the beach. For that reason and since there is some uncertainty about what there can be doing at lymphedema clinic next Wednesday I am get a make an appointment for next Friday here for [place] to see what we need to do for him prior to him leaving for vacation. 10/23; patient arrives in considerable pain predominantly in the upper posterior calf just distal to the popliteal fossa also in the wound anteriorly above the major wound. This is probably cellulitis and he has had this recurrently in the past. He has no open wound on the right side and he has had an Radio broadcast assistant in that area. Finally I note that he has an area on the left posterior calf which by enlarge is mostly epithelialized. This protrudes beyond the borders of the surrounding skin in the setting of dry scaly skin and lymphedema. The patient is leaving for [facility] on Sunday. Per his longstanding pattern, he will not take his compression pumps with him predominantly out of fear that they will be stolen. He therefore asked that we put a Unna boot back on the right leg. He will also  contact the wound care center in [facility] to see if they can change his dressing in the mid week. 11/3; patient returned from his vacation to [facility]. He was seen on 1 occasion at their wound care center. They did a 2 layer compression system as they did not have our 4-layer wrap. I am not completely certain what they put on the wounds. They did not change the Unna boot on the right. The patient is also seeing a lymphedema specialist physical therapist in [place]. It appears that he has some compression sleeve for his thighs which indeed look quite a bit better than I am used to seeing. He pumps over these with his external compression pumps. 11/10;  the patient has a new wound on the right medial thigh otherwise there is no open areas on the right. He has an area on the left leg posteriorly anteriorly and medially and an area over the left second toe. We have been using silver alginate. He thinks the injury on his thigh is secondary to friction from the compression sleeve he has. 11/17; the patient has a new wound on the right medial thigh last week. He thinks this is because he did not have a underlying stocking for his thigh juxta lite apparatus. He now has this. The area is fairly large and somewhat angry but I do not think he has underlying cellulitis. ooHe has a intact blister on the right anterior tibial area. ooSmall wound on the right great toe dorsally ooSmall area on the medial left calf. 11/30; the patient does not have any open areas on his right leg and we did not take his juxta lite stocking off. However he states that on Friday his compression wrap fell down lodging around his upper mid calf area. As usual this creates a lot of problems for him. He called urgently today to be seen for a nurse visit however the nurse visit turned into a provider visit because of extreme erythema and pain in the left anterior tibia extending laterally and posteriorly. The area that is  problematic is extensive 10/06/2019 upon evaluation today patient actually appears to be doing poorly in regard to his left lower extremity. He Dr. Leanord Hawking did place him on doxycycline this past Monday apparently due to the fact that he was doing much worse in regard to this left leg. Fortunately the doxycycline does seem to be helping. Unfortunately we are still having a very difficult time getting his edema under any type of control in order to anticipate discharge at some point. The only way were really able to control his lymphedema really is with compression wraps and that has only even seemingly temporary. He has been seeing a lymphedema clinic they are trying to help in this regard but still this has been somewhat frustrating in general for the patient. 10/13/19 on evaluation today patient appears to be doing excellent with regard to his right lower extremity as far as the wounds are concerned. His swelling is still quite extensive unfortunately. He is still having a lot of drainage from the thigh areas bilaterally which is unfortunate. He's been going to lymphedema clinic but again he still really does not have this edema under control as far as his lower extremities are concern. With regard to his left lower extremity this seems to be improving and I do believe the doxycycline has been of benefit for him. He is about to complete the doxycycline. 10/20/2019 on evaluation today patient appears to be doing poorly in regard to his bilateral lower extremities. More in the right thigh he has a lot of irritation at this site unfortunately. In regard to the left lower extremity the wrap was not quite as high it appears and does seem to have caused him some trouble as well. Fortunately there is no evidence of systemic infection though he does have some blue-green drainage which has me concerned for the possibility of Pseudomonas. He tells me he is previously taking Cipro without complications and he really  does not care for Levaquin however due to some of the side effects he has. He is not allergic to any medications specifically antibiotics that were aware of. 10/27/2019 on evaluation today patient actually does  appear to be for the most part doing better when compared to last week's evaluation. With that being said he still has multiple open wounds over the bilateral lower extremities. He actually forgot to start taking the Cipro and states that he still has the whole bottle. He does have several new blisters on left lower extremity today I think I would recommend he go ahead and take the Cipro based on what I am seeing at this point. 12/30-Patient comes at 1 week visit, 4 layer compression wraps on the left and Unna boot on the right, primary dressing Xtrasorb and silver alginate. Patient is taking his Cipro and has a few more days left probably 5-6, and the legs are doing better. He states he is using his compressions devices which I believe he has 11/10/2019 on evaluation today patient actually appears to be much better than last time I saw him 2 weeks ago. His wounds are significantly improved and overall I am very pleased in this regard. Fortunately there is no signs of active infection at this time. He is just a couple of days away from completing Cipro. Overall his edema is much better he has been using his lymphedema pumps which I think is also helping at this point. 11/17/2019 on evaluation today patient appears to be doing excellent in regard to his wounds in general. His legs are swollen but not nearly as much as they have been in the past. Fortunately he is tolerating the compression wraps without complication. No fevers, chills, nausea, vomiting, or diarrhea. He does have some erythema however in the distal portion of his right lower extremity specifically around the forefoot and toes there is a little bit of warmth here as well. 11/24/2019 on evaluation today patient appears to be doing well  with regard to his right lower extremity I really do not see any open wounds at this point. His left lower extremity does have several open areas and his right medial thigh also is open. Other than this however overall the patient seems to be making good progress and I am very pleased at this point. 12/01/2019 on evaluation today patient appears to be doing poorly at this point in regard to his left lower extremity has several new blisters despite the fact that we have him in compression wraps. In fact he had a 4-layer compression wrap, his upper thigh wrapped from lymphedema clinic, and a juxta light over top of the 4 layer compression wrap the lymphedema clinic applied and despite all this he still develop blisters underneath. Obviously this does have me concerned about the fact that unfortunately despite what we are doing to try to get wounds healed he continues to have new areas arise I do not think he is ever good to be at the point where he can realistically just use wraps at home to keep things under control. Typically when we heal him it takes about 1-2 days before he is back in the clinic with severe breakdown and blistering of his lower extremities bilaterally. This is happened numerous times in the past. Unfortunately I think that we may need some help as far as overall fluid overload to kind of limit what we are seeing and get things under better control. 12/08/2019 on evaluation today patient presents for follow-up concerning his ongoing bilateral lower extremity edema. Unfortunately he is still having quite a bit of swelling the compression wraps are controlling this to some degree but he did see Dr. [name] his cardiologist I do  have that available for review today as far as the appointment was concerned that was on 12/06/2019. Obviously that she has been 2 days ago. The patient states that he is only been taking the Lasix 80 mg 1 time a day he had told me previously he was taking this twice a  day. Nonetheless Dr. [name] recommended this be up to 80 mg 2 times a day for the patient as he did appear to be fluid overloaded. With that being said the patient states he did this yesterday and he was unable to go anywhere or do anything due to the fact that he was constantly having to urinate. Nonetheless I think that this is still good to be something that is important for him as far as trying to get his edema under control at all things that he is going to be able to just expect his wounds to get under control and things to be better without going through at least a period of time where he is trying to stabilize his fluid management in general and I think increasing the Lasix is likely the first step here. It was also mentioned the possibility that the patient may require metolazone. With that being said he wanted to have the patient take Lasix twice a day first and then reevaluating 2 months to see where things stand. 12/15/2019 upon evaluation today patient appears to be doing regard to his legs although his toes are showing some signs of weeping especially on the left at this point to some degree on the right. There does not appear to be any signs of active infection and overall I do feel like the compression wraps are doing well for him but he has not been able to take the Lasix at home and the increased dose that Dr. [name] recommended. He tells me that just not go to be feasible for him. Nonetheless I think in this case he should probably send a message to Dr. [name] in order to discuss options from the standpoint of possible admission to get the fluid off or otherwise going forward. 12/22/2019 upon evaluation today patient appears to be doing fairly well with regard to his lower extremities at this point. In fact he would be doing excellent if it was not for the fact that his right anterior thigh apparently had an allergic reaction to adhesive tape that he used. The wound itself that we have  been monitoring actually appears to be healed. There is a lot of irritation at this point. 12/29/2019 upon evaluation today patient appears to be doing well in regard to his lower extremities. His left medial thigh is open and somewhat draining today but this is the only region that is open the right has done much better with the treatment utilizing the steroid cream that I prescribed for him last week. Overall I am pleased in that regard. Fortunately there is no signs of active infection at this time. No fevers, chills, nausea, vomiting, or diarrhea. 01/05/2020 upon evaluation today patient appears to be doing more poorly in regard to his right lower extremity at this point upon evaluation today. Unfortunately he continues to have issues in this regard and I think the biggest issue is controlling his edema. This obviously is not very well controlled at this point is been recommended that he use the Lasix twice a day but he has not been able to do that. Unfortunately I think this is leading to an issue where honestly he is not really  able to effectively control his edema and therefore the wounds really are not doing significantly better. I do not think that he is going to be able to keep things under good control unless he is able to control his edema much better. I discussed this again in great detail with him today. 01/12/2020 good news is patient actually appears to be doing quite well today at this point. He does have an appointment with lymphedema clinic tomorrow. His legs appear healed and the toe on the left is almost completely healed. In general I am very pleased with how things stand at this point. 01/19/2020 upon evaluation today patient appears to actually be doing well in regard to his lower extremities there is nothing open at this point. Fortunately he has done extremely well more recently. Has been seeing lymphedema clinic as well. With that being said he has Velcro wraps for his lower legs  as well as his upper legs. The only wound really is on his toe which is the right great toe and this is barely anything even there. With all that being said I think it is good to be appropriate today to go ahead and switch him over to the Velcro compression wraps. 01/26/2020 upon evaluation today patient appears to be doing worse with regard to his lower extremities after last week switch him to Velcro compression wraps. Unfortunately he lasted less than 24 hours he did not have the sock portion of his Velcro wrap on the left leg and subsequently developed a blister underneath the Velcro portion. Obviously this is not good and not what we were looking for at this point. He states the lymphedema clinic did tell him to wear the wrap for 23 hours and take him off for 1 I am okay with that plan but again right now we got a get things back under control again he may have some cellulitis noted as well. 02/02/2020 upon evaluation today patient unfortunately appears to have several areas of blistering on his bilateral lower extremities today mainly on the feet. His legs do seem to be doing somewhat better which is good news. Fortunately there is no evidence of active infection at this time. No fevers, chills, nausea, vomiting, or diarrhea. 02/16/2020 upon evaluation today patient appears to be doing well at this time with regard to his legs. He has a couple weeping areas on his toes but for the most part everything is doing better and does appear to be sealed up on his legs which is excellent news. We can continue with wrapping him at this point as he had every time we discontinue the wraps he just breaks out with new wounds. There is really no point in is going forward with this at this point. 03/08/2020 upon evaluation today patient actually appears to be doing quite well with regard to his lower extremity ulcers. He has just a very superficial and really almost nonexistent blister on the left lower extremity he  has in general done very well with the compression wraps. With that being said I do not see any signs of infection at this time which is good news. 03/29/2020 upon evaluation today patient appears to be doing well with regard to his wounds currently except for where he had several new areas that opened up due to some of the wrap slipping and causing him trouble. He states he did not realize they had slipped. Nonetheless he has a 1 area on the right and 3 new areas on the  left. Fortunately there is no signs of active infection at this time which is great news. 04/05/2020 upon evaluation today patient actually appears to be doing quite well in general in regard to his legs currently. Fortunately there is no signs of active infection at this time. No fevers, chills, nausea, vomiting, or diarrhea. He tells me next week that he will actually be seen in the lymphedema clinic on Thursday at 10 AM I see him on Wednesday next week. 04/12/2020 upon evaluation today patient appears to be doing very well with regard to his lower extremities bilaterally. In fact he does not appear to have any open wounds at this point which is good news. Fortunately there is no signs of active infection at this time. No fevers, chills, nausea, vomiting, or diarrhea. 04/19/2020 upon evaluation today patient appears to be doing well with regard to his wounds currently on the bilateral lower extremities. There does not appear to be any signs of active infection at this time. Fortunately there is no evidence of systemic infection and overall very pleased at this point. Nonetheless after I held him out last week he literally had blisters the next morning already which swelled up with him being right back here in the clinic. Overall I think that he is just not can be able to be discharged with his legs the way they are he is much to volume overloaded as far as fluid is concerned and that was discussed with him today of also discussed this but  should try the clinic nurse manager as well as Dr. Leanord Hawking. 04/26/2020 upon evaluation today patient appears to be doing better with regard to his wounds currently. He is making some progress and overall swelling is under good control with the compression wraps. Fortunately there is no evidence of active infection at this time. 05/10/2020 on evaluation today patient appears to be doing overall well in regard to his lower extremities bilaterally. He is Tolerating the compression wraps without complication and with what we are seeing currently I feel like that he is making excellent progress. There is no signs of active infection at this time. 05/24/2020 upon evaluation today patient appears to be doing well in regard to his legs. The swelling is actually quite a bit down compared to where it has been in the past. Fortunately there is no sign of active infection at this time which is also good news. With that being said he does have several wounds on his toes that have opened up at this point. 05/31/2020 upon evaluation today patient appears to be doing well with regard to his legs bilaterally where he really has no significant fluid buildup at this point overall he seems to be doing quite well. Very pleased in this regard. With regard to his toes these also seem to be drying up which is excellent. We have continue to wrap him as every time we tried as a transition to the juxta light wraps things just do not seem to get any better. 06/07/2020 upon evaluation today patient appears to be doing well with regard to his right leg at this point. Unfortunately left leg has a lot of blistering he tells me the wrap started to slide down on him when he tried to put his other Velcro wrap over top of it to help keep things in order but nonetheless still had some issues. 06/14/2020 on evaluation today patient appears to be doing well with regard to his lower extremity ulcers and foot ulcers at this  point. I feel like  everything is actually showing signs of improvement which is great news overall there is no signs of active infection at this time. No fevers, chills, nausea, vomiting, or diarrhea. 06/21/2020 on evaluation today patient actually appears to be doing okay in regard to his wounds in general. With that being said the biggest issue I see is on his right foot in particular the first and second toe seem to be doing a little worse due to the fact this is staying very wet. I think he is probably getting need to change out his dressings a couple times in between each week when we see him in regard to his toes in order to keep this drier based on the location and how this is proceeding. 06/28/2020 on evaluation today patient appears to be doing a little bit more poorly overall in regard to the appearance of the skin I am actually somewhat concerned about the possibility of him having a little bit of an infection here. We discussed the course of potentially giving him a doxycycline prescription which he is taken previously with good result. With that being said I do believe that this is potentially mild and at this point easily fixed. I just do not want anything to get any worse. 07/12/2020 upon evaluation today patient actually appears to be making some progress with regard to his legs which is great news there does not appear to be any evidence of active infection. Overall very pleased with where things stand. Objective Constitutional Well-nourished and well-hydrated in no acute distress. Vitals Time Taken: 11:37 AM, Height: 70 in, Weight: 380.2 lbs, BMI: 54.5, Temperature: 98.4 F, Pulse: 79 bpm, Respiratory Rate: 20 breaths/min, Blood Pressure: 153/54 mmHg, Capillary Blood Glucose: 186 mg/dl. Respiratory normal breathing without difficulty. Psychiatric this patient is able to make decisions and demonstrates good insight into disease process. Alert and Oriented x 3. pleasant and cooperative. General  Notes: Patient's wounds currently are showing signs of good improvement I see no evidence of active infection overall he is progressing quite nicely. Integumentary (Hair, Skin) Wound #178 status is Healed - Epithelialized. Original cause of wound was Gradually Appeared. The wound is located on the Right T Second. The wound oe measures 0cm length x 0cm width x 0cm depth; 0cm^2 area and 0cm^3 volume. There is no tunneling or undermining noted. There is a none present amount of drainage noted. The wound margin is flat and intact. There is no granulation within the wound bed. There is no necrotic tissue within the wound bed. Wound #180 status is Open. Original cause of wound was Blister. The wound is located on the Left,Proximal,Dorsal Foot. The wound measures 1cm length x 1.3cm width x 0.1cm depth; 1.021cm^2 area and 0.102cm^3 volume. There is Fat Layer (Subcutaneous Tissue) exposed. There is no tunneling or undermining noted. There is a medium amount of serous drainage noted. The wound margin is flat and intact. There is large (67-100%) pink granulation within the wound bed. There is no necrotic tissue within the wound bed. 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 Post procedure Diagnosis Wound #:  Same as Pre-Procedure There was a Four Layer Compression Therapy Procedure by Yevonne Pax, RN. Post procedure Diagnosis Wound #: Same as Pre-Procedure Plan Follow-up Appointments: Return Appointment in 2 weeks. -  with Leonard Schwartz Nurse Visit: - 1 week Dressing Change Frequency: Wound #180 Left,Proximal,Dorsal Foot: Do not change entire dressing for one week. - both legs Skin Barriers/Peri-Wound Care: Moisturizing lotion - both legs and feet and toes Wound Cleansing: May shower with protection. Primary Wound Dressing: Calcium Alginate with Silver - to any blisters or weeping areas Wound #180 Left,Proximal,Dorsal Foot: Calcium Alginate with Silver Secondary Dressing: Wound #180 Left,Proximal,Dorsal Foot: Dry Gauze Edema Control: 4 layer compression: Left lower extremity Unna Boot to Right Lower Extremity Avoid standing for long periods of time Elevate legs to the level of the heart or above for 30 minutes daily and/or when sitting, a frequency of: - throughout the day Exercise regularly 1. I would recommend that we go ahead and initiate treatment with a 4-layer compression wrap to left lower extremity. 2. I am also can recommend that we use an Unna boot wrap to the right leg as we have been doing in the past. 3. I am also can recommend that we go ahead and continue with silver alginate to the open wound areas which she has done very well with up to this point. We will see patient back for reevaluation in 2 weeks here in the clinic. If anything worsens or changes patient will contact our office for additional recommendations. He will be seen next week for nurse visit. Electronic Signature(s) Signed: 07/12/2020 5:42:16 PM By: Lenda Kelp PA-C Entered By: Lenda Kelp on 07/12/2020 17:42:15 -------------------------------------------------------------------------------- SuperBill Details Patient Name: Date of Service: [name], [name] 07/12/2020 Medical Record Number: [id number] Patient Account Number: [id number] Date of Birth/Sex: Treating RN: [birth year] (69 y.o. [name] Primary Care Provider: Marney Setting, [name] Other Clinician: Referring Provider: Treating Provider/Extender: Suzy Bouchard, [name] in Treatment: 233 Diagnosis Coding ICD-10 Codes Code Description [phone] Non-pressure chronic ulcer of right calf limited to breakdown of skin L97.221 Non-pressure chronic ulcer of left calf limited to breakdown of skin 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 of bilateral lower extremity I89.0 Lymphedema, not elsewhere classified E11.622 Type 2 diabetes mellitus with other skin ulcer 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, unspecified L03.116 Cellulitis of left lower limb Facility Procedures CPT4 Code: [id number] Description: (Facility Use Only) [phone] - APPLY UNNA BOOT RT Modifier: Quantity: 1 CPT4 Code: 57846962 Description: (Facility Use Only) [id number] - APPLY MULTLAY COMPRS LWR LT LEG Modifier: 59 Quantity: 1 Physician Procedures : CPT4 Code Description Modifier [id number] - WC PHYS LEVEL 3 - EST PT ICD-10 Diagnosis Description 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 skin L97.221 Non-pressure chronic ulcer of left calf limited to breakdown of  skin 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 of bilateral lower extremity I89.0 Lymphedema, not elsewhere classified Quantity: 1 Electronic Signature(s) Signed: 07/12/2020 5:43:17 PM By: Lenda Kelp PA-C Previous Signature: 07/12/2020 5:28:47 PM Version By: Zenaida Deed RN, BSN Entered By: Lenda Kelp on 07/12/2020 17:42:58

## 2020-07-12 NOTE — Progress Notes (Signed)
OFFICE VISIT  07/12/2020  CC:  Chief Complaint  Patient presents with  . Diabetes    [name]    HPI:    Patient is a 69 y.o. Caucasian male who presents for 3 mo f/u DM 2, HTN, CRI III. Gluc 186 this morning and he took 20 U 75/25.  He adjusts dose himself based on gluc's.  Occ does not give more than 1 insulin inj/day. Has had a gluc of 72 once since wt loss center put him on saxenda.  Says not so much imp in glucoses since getting on that med.  HTN: bp up today but no meds yet, home measurements consistently < 130/80.  Trying to stay hydrated. Avoids NSAIDs.  He is on CCB, BB, and DOAC for chronic atrial fibrillation.  ROS: no fevers, no CP, no SOB, no wheezing, no cough, no dizziness, no HAs, no rashes, no melena/hematochezia.  No polyuria or polydipsia.  No myalgias or arthralgias.  No focal weakness, paresthesias, or tremors.  No acute vision or hearing abnormalities. No n/v/d or abd pain.  No palpitations.     Past Medical History:  Diagnosis Date  . ALLERGIC RHINITIS 08/11/2006  . ASTHMA 08/11/2006  . Chronic atrial fibrillation (HCC) 08/2014  . Chronic combined systolic and diastolic CHF (congestive heart failure) [facility])    Cardiology f/u 12/2017: pt volume overloaded (R heart dysf suspected), BNP very high, lasix increased.  Repeat echo 12/2017: normal LV EF, mild DD, +RV syst dysfxn, mod pulm HTN, biatrial enlgmt.  . Chronic constipation   . Chronic renal insufficiency, stage 2 (mild)    Borderline stage III (GFR 60s).  . DIABETES MELLITUS, TYPE II 08/11/2006  . DM W/RENAL MANIFESTATIONS, TYPE II 04/20/2007  . HYPERTENSION 08/11/2006  . Impaired mobility and endurance   . MRSA infection 06/2018   LL venous stasis ulcer infected  . Normocytic anemia 2016-2019   03/2018 B12 normal, iron ok (ferritin borderline low).  Plan repeat CBC at f/u summer 2019 and if decreased from baseline will check hemoccults and iron labs again.  . OBESITY, MORBID 12/14/2007   saxenda started 05/2020  by [facility] wt mgmt center  . OSA (obstructive sleep apnea)    to get sleep study with [facility]) as of 12/03/2018 consult.  . 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  [id number] ID started him on amoxil prophylaxis---ineffective.  End 2018/Jan 2019 penicillin G injections prophyl helpful but pt declined to continue this as of 01/2018 ID f/u.  ID talked him into resuming monthly penicillin G as of 02/2018 f/u.    Marland Kitchen Restless leg syndrome    Rx'd clonazepam 09/2017 and pt refused to take it after reading the medication's potential side effects.  . Venous stasis ulcers of both lower extremities (HCC)    Severe lymphedema.  wound clinic care ongoing as of 01/2018    Past Surgical History:  Procedure Laterality Date  . Carotid dopplers  07/23/2018   Left NORMAL.  Right 1-39% ICA stenosis, with <50% distal CCA stenosis (not hemodynamically significant)  . TRANSTHORACIC ECHOCARDIOGRAM  11/2007; 09/2014; 11/2015;12/2017   LV fxn normal, EF normal, mild dilation of left atrium.  2015 grade II diast dysfxn.  2017 EF 55-60%. 2019: LVEF 60-65%, mild RV syst dysf,biatrial enlgmt, mod pulm htn.  Marland Kitchen URETERAL STENT PLACEMENT    . virtual colonoscopy  01/2011   Normal    Outpatient Medications Prior to Visit  Medication Sig Dispense  Refill  . arginine 500 MG tablet Take by mouth.    Marland Kitchen b complex vitamins capsule Take 1 capsule by mouth every morning.    . butalbital-acetaminophen-caffeine (FIORICET WITH CODEINE) 50-325-40-30 MG capsule TAKE 1 TO 2 CAPSULES BY  MOUTH EVERY 6 HOURS AS  NEEDED FOR HEADACHE(S)  MANUFACTURER RECOMMENDS NOT EXCEEDING 6 CAPSULES/DAY 60 capsule 0  . cetirizine (ZYRTEC) 10 MG tablet Take 10 mg by mouth daily.    . Coenzyme Q10 (CO Q 10) 10 MG CAPS Take by mouth. Reported on 02/19/2016    . diltiazem (TIAZAC) 240 MG 24 hr capsule TAKE 1 CAPSULE BY MOUTH  DAILY 90 capsule 0  . doxycycline (VIBRAMYCIN) 100 MG  capsule Take 100 mg by mouth 2 (two) times daily.    . furosemide (LASIX) 40 MG tablet TAKE 2 TABLETS BY MOUTH TWO TIMES DAILY (Patient taking differently: Take 80 mg in the mornings and 40 mg in the evenings) 360 tablet 3  . Glutamine 500 MG CAPS Take by mouth.    Marland Kitchen HYDROcodone-acetaminophen (NORCO/VICODIN) 5-325 MG tablet 1-2 tabs po bid prn pain 60 tablet 0  . Insulin Lispro Prot & Lispro (HUMALOG MIX 75/25 KWIKPEN) (75-25) 100 UNIT/ML Kwikpen INJECT SUBCUTANEOUSLY 27 UNITS EVERY MORNING AND 22 UNITS EVERY EVENING AT  SUPPER. 45 mL 3  . Insulin Pen Needle (B-D ULTRAFINE III SHORT PEN) 31G X 8 MM MISC USE 1 PEN NEEDLE TWICE DAILY 100 each 3  . Insulin Pen Needle (BD ULTRA-FINE PEN NEEDLES) 29G X 12.7MM MISC 1 each by Other route 2 (two) times daily. 200 each 2  . Insulin Pen Needle (BD ULTRA-FINE PEN NEEDLES) 29G X 12.7MM MISC USE TWICE DAILY 200 each 3  . Liraglutide -Weight Management (SAXENDA Selz) Inject 1.2 mg into the skin.    Marland Kitchen lisinopril (ZESTRIL) 40 MG tablet TAKE 1 TABLET BY MOUTH  DAILY 90 tablet 1  . metFORMIN (GLUCOPHAGE) 1000 MG tablet TAKE 1 TABLET BY MOUTH  TWICE DAILY WITH MEALS 180 tablet 1  . metoprolol tartrate (LOPRESSOR) 50 MG tablet TAKE 1 AND 1/2 TABLETS BY  MOUTH TWICE DAILY 270 tablet 3  . Multiple Vitamin (MULTIVITAMIN) tablet Take 1 tablet by mouth daily.    [name] ULTRA test strip CHECK BLOOD SUGAR TWICE  DAILY 200 strip 3  . rivaroxaban (XARELTO) 20 MG TABS tablet TAKE 1 TABLET BY MOUTH ONCE DAILY WITH SUPPER 90 tablet 3  . saw palmetto 500 MG capsule Take 500 mg by mouth daily.    Marland Kitchen terazosin (HYTRIN) 10 MG capsule TAKE 1 CAPSULE BY MOUTH  ONCE DAILY AT BEDTIME 90 capsule 3  . vitamin E 400 UNIT capsule Take 400 Units by mouth every morning. Selenium 50mg     . triamcinolone ointment (KENALOG) 0.1 % APPLY OINTMENT WITH EACH DRESSING CHANGE TO LEGS AS DIRECTED (Patient not taking: Reported on 07/12/2020)     Facility-Administered Medications Prior to Visit   Medication Dose Route Frequency Provider Last Rate Last Admin  . penicillin g benzathine (BICILLIN LA) 1200000 UNIT/2ML injection 600,000 Units  600,000 Units Intramuscular Q30 days 09/11/2020, MD   600,000 Units at 04/12/20 0933  . penicillin g benzathine (BICILLIN LA) 1200000 UNIT/2ML injection 600,000 Units  600,000 Units Intramuscular Q30 days 06/12/20, MD   600,000 Units at 04/12/20 0936  . penicillin g benzathine (BICILLIN LA) 1200000 UNIT/2ML injection 600,000 Units  600,000 Units Intramuscular Q30 days 06/12/20, MD   600,000 Units at 07/12/20 0951  .  penicillin g benzathine (BICILLIN LA) 1200000 UNIT/2ML injection 600,000 Units  600,000 Units Intramuscular Q30 days [name], MD   600,000 Units at 07/12/20 0949    Allergies  Allergen Reactions  . Other Other (See Comments)    Sneezing, coughing    ROS As per HPI  PE: Vitals with BMI 07/12/2020 04/12/2020 02/09/2020  Height - 5\' 9"  5\' 9"   Weight 380 lbs 10 oz 380 lbs 10 oz 382 lbs 10 oz  BMI - 56.18 56.47  Systolic 158 135  Diastolic 63 66 70  Pulse 68 52 53     Gen: Alert, well appearing.  Patient is oriented to person, place, time, and situation. AFFECT: pleasant, lucid thought and speech. CV: distant s1 and s2, irreg irreg, no audible m/r/g Chest is clear, no wheezing or rales. Normal symmetric air entry throughout both lung fields. No chest wall deformities or tenderness. Both LL's in Unna boots No cyanosis  LABS:  Lab Results  Component Value Date   TSH 4.40 06/08/2018   Lab Results  Component Value Date   WBC 6.1 01/05/2020   HGB 12.6 (L) 01/05/2020   HCT 38.4 (L) 01/05/2020   MCV 85.6 01/05/2020   PLT 187.0 01/05/2020   Lab Results  Component Value Date   IRON 45 03/10/2018   FERRITIN 26.4 03/10/2018   Lab Results  Component Value Date   VITAMINB12 592 03/10/2018    Lab Results  Component Value Date   CREATININE 1.33 (H) 02/09/2020   BUN 20 02/09/2020   NA 141  02/09/2020   K 4.6 02/09/2020   CL 100 02/09/2020   CO2 24 02/09/2020   Lab Results  Component Value Date   ALT 10 07/06/2019   AST 17 07/06/2019   ALKPHOS 76 07/06/2019   BILITOT 1.0 07/06/2019   Lab Results  Component Value Date   CHOL 106 07/06/2019   Lab Results  Component Value Date   HDL 40.20 07/06/2019   Lab Results  Component Value Date   LDLCALC 55 07/06/2019   Lab Results  Component Value Date   TRIG 55.0 07/06/2019   Lab Results  Component Value Date   CHOLHDL 3 07/06/2019   Lab Results  Component Value Date   PSA 0.71 07/06/2019   PSA 0.26 03/09/2018   PSA 0.30 12/09/2016   Lab Results  Component Value Date   HGBA1C 7.5 (H) 01/05/2020    IMPRESSION AND PLAN:  1) DM 2 Cost issues with saxenda so he won't be on this long. He does well with metformin and with adjusting his humalog 75/25 mix. HbA1c and lytes/cr today. FLP and hepatic panel today.  2) HTN: The current medical regimen is effective;  continue present plan and medications. Lytes/cr today.    3) CRI III: avoids NSAIDs, decent hydration habits. Lytes/cr today.  4) Chronic a-fib: doing well. Continue  CCB, BB, DOAC. CBC today.  5) Preventative health: PSA today. Flu vaccine today.  An After Visit Summary was printed and given to the patient.  FOLLOW UP: 3 mo [name]  Signed:  02/06/2017, MD           07/12/2020

## 2020-07-13 NOTE — Progress Notes (Signed)
[name], [name] ([id number]) Visit Report for 07/12/2020 Arrival Information Details Patient Name: Date of Service: [name], [name] 07/12/2020 11:00 A M Medical Record Number: [id number] Patient Account Number: [id number] Date of Birth/Sex: Treating RN: 1951/05/03 (69 y.o. [name] Primary Care Brendon Christoffel: [name], [name] Other Clinician: Referring Eulah Walkup: Treating Cleopatra Sardo/Extender: Worthy Keeler [facility] [name], [name] in Treatment: 15 Visit Information History Since Last Visit Added or deleted any medications: No Patient Arrived: Walker Any new allergies or adverse reactions: No Arrival Time: 11:35 Had a fall or experienced change in No Accompanied By: self activities of daily living that may affect Transfer Assistance: None risk of falls: Patient Identification Verified: Yes Signs or symptoms of abuse/neglect since last visito No Secondary Verification Process Completed: Yes Hospitalized since last visit: No Patient Requires Transmission-Based Precautions: No Implantable device outside of the clinic excluding No Patient Has Alerts: Yes cellular tissue based products placed in the [name] since last visit: Pain Present Now: No Electronic Signature(s) Signed: 07/12/2020 1:03:45 PM By: Sandre Kitty Entered By: Sandre Kitty on 07/12/2020 11:37:36 -------------------------------------------------------------------------------- Compression Therapy Details Patient Name: Date of Service: [name], [name] 07/12/2020 11:00 A M Medical Record Number: [id number] Patient Account Number: [id number] Date of Birth/Sex: Treating RN: 1951/07/14 (69 y.o. [name] Primary Care Nickola Lenig: [name], [name] Other Clinician: Referring Ama Mcmaster: Treating Gizelle Whetsel/Extender: Worthy Keeler [employer] [name][facility] [name], [name] in Treatment: 233 Compression Therapy Performed for Wound Assessment: NonWound Condition Lymphedema - Right Leg Performed By: Clinician [name],  RN Compression Type: [name] Post Procedure Diagnosis Same as Pre-procedure Electronic Signature(s) Signed: 07/12/2020 5:28:47 PM By: Baruch Gouty RN, BSN Entered By: Baruch Gouty on 07/12/2020 12:03:41 -------------------------------------------------------------------------------- Compression Therapy Details Patient Name: Date of Service: [name], [name] 07/12/2020 11:00 A M Medical Record Number: [id number] Patient Account Number: [id number] Date of Birth/Sex: Treating RN: 06/26/1951 (69 y.o. [name] Primary Care Areon Cocuzza: [name], [name] Other Clinician: Referring Kavon Valenza: Treating Francessca Friis/Extender: Worthy Keeler [facility] [name], [name] in Treatment: 233 Compression Therapy Performed for Wound Assessment: NonWound Condition Lymphedema - Left Leg Performed By: Clinician [name], RN Compression Type: Four Layer Post Procedure Diagnosis Same as Pre-procedure Electronic Signature(s) Signed: 07/12/2020 5:28:47 PM By: Baruch Gouty RN, BSN Entered By: Baruch Gouty on 07/12/2020 12:04:10 -------------------------------------------------------------------------------- Encounter Discharge Information Details Patient Name: Date of Service: [name], [name] 07/12/2020 11:00 A M Medical Record Number: [id number] Patient Account Number: [id number] Date of Birth/Sex: Treating RN: 08/22/1951 (69 y.o. [name] Primary Care Padme Arriaga: [name], [name] Other Clinician: Referring Amos Micheals: Treating Emry Barbato/Extender: Worthy Keeler [facility] [name], [name] in Treatment: [phone] Encounter Discharge Information Items Discharge Condition: Stable Ambulatory Status: Walker Discharge Destination: Home Transportation: Private Auto Accompanied By: self Schedule Follow-up Appointment: Yes Clinical Summary of Care: Electronic Signature(s) Signed: 07/12/2020 5:39:04 PM By: Deon Pilling Entered By: Deon Pilling on 07/12/2020  12:31:21 -------------------------------------------------------------------------------- Lower Extremity Assessment Details Patient Name: Date of Service: [name], [name] 07/12/2020 11:00 A M Medical Record Number: [id number] Patient Account Number: [id number] Date of Birth/Sex: Treating RN: Oct 24, 1951 (69 y.o. [name] Primary Care Jaylyn Booher: [name], [name] Other Clinician: Referring Mairi Stagliano: Treating Latanga Nedrow/Extender: Worthy Keeler [facility] [name], [name] in Treatment: 233 Edema Assessment Assessed: [Left: No] [Right: No] Edema: [Left: Yes] [Right: Yes] Calf Left: Right: Point of Measurement: 25 cm From Medial Instep 40 cm 41.2 cm Ankle Left: Right: Point of Measurement: 9 cm From Medial Instep 26 cm 28 cm Vascular Assessment  Pulses: Dorsalis Pedis Palpable: [Left:Yes] [Right:Yes] Electronic Signature(s) Signed: 07/13/2020 5:37:15 PM By: Levan Hurst RN, BSN Entered By: Levan Hurst on 07/12/2020 11:56:57 -------------------------------------------------------------------------------- Multi-Disciplinary Care Plan Details Patient Name: Date of Service: [name], [name] 07/12/2020 11:00 A M Medical Record Number: [id number] Patient Account Number: [id number] Date of Birth/Sex: Treating RN: 1950/11/22 (69 y.o. [name] Primary Care Melesa Lecy: [name], [name] Other Clinician: Referring Felesha Moncrieffe: Treating Shervon Kerwin/Extender: Worthy Keeler [facility] [name], [name] in Treatment: 233 Active Inactive Venous Leg Ulcer Nursing Diagnoses: Actual venous Insuffiency (use after diagnosis is confirmed) Goals: Patient will maintain optimal edema control Date Initiated: 09/10/2016 Target Resolution Date: 07/26/2020 Goal Status: Active Verify adequate tissue perfusion prior to therapeutic compression application Date Initiated: 09/10/2016 Date Inactivated: 11/28/2016 Goal Status: Met Interventions: Assess peripheral edema status every visit. Compression as  ordered Provide education on venous insufficiency Notes: Electronic Signature(s) Signed: 07/12/2020 5:28:47 PM By: Baruch Gouty RN, BSN Entered By: Baruch Gouty on 07/12/2020 12:02:15 -------------------------------------------------------------------------------- Pain Assessment Details Patient Name: Date of Service: [name], [name] 07/12/2020 11:00 A M Medical Record Number: [id number] Patient Account Number: [id number] Date of Birth/Sex: Treating RN: [birth year] (69 y.o. [name] Primary Care Brodee Mauritz: Other Clinician: [name], [name] Referring Shakyia Bosso: Treating Mills Mitton/Extender: Worthy Keeler MCG[facility] [name], [name] in Treatment: 233 Active Problems Location of Pain Severity and Description of Pain Patient Has Paino No Site Locations Pain Management and Medication Current Pain Management: Electronic Signature(s) Signed: 07/12/2020 1:03:45 PM By: Sandre Kitty Signed: 07/12/2020 5:28:47 PM By: Baruch Gouty RN, BSN Entered By: Sandre Kitty on 07/12/2020 11:38:12 -------------------------------------------------------------------------------- Patient/Caregiver Education Details Patient Name: Date of Service: [name], [name] 9/8/2021andnbsp11:00 A M Medical Record Number: [id number] Patient Account Number: [id number] Date of Birth/Gender: Treating RN: 03-12-1951 (69 y.o. [name] Primary Care Physician: [name], [name] Other Clinician: Referring Physician: Treating Physician/Extender: Criss Rosales, [name] in Treatment: 19 Education Assessment Education Provided To: Patient Education Topics Provided Venous: Methods: Explain/Verbal Responses: Reinforcements needed, State content correctly Electronic Signature(s) Signed: 07/12/2020 5:28:47 PM By: Baruch Gouty RN, BSN Entered By: Baruch Gouty on 07/12/2020 12:02:45 -------------------------------------------------------------------------------- Wound Assessment  Details Patient Name: Date of Service: [name], [name] 07/12/2020 11:00 A M Medical Record Number: [id number] Patient Account Number: [id number] Date of Birth/Sex: Treating RN: 1951/02/27 (69 y.o. [name] Primary Care Amare Bail: [name], [name] Other Clinician: Referring Kierria Feigenbaum: Treating Barbarajean Kinzler/Extender: Worthy Keeler M[facility] [name], [name] in Treatment: 233 Wound Status Wound Number: [id number] Primary Lymphedema Etiology: Wound Location: Right T Second oe Wound Healed - Epithelialized Wounding Event: Gradually Appeared Status: Date Acquired: 05/24/2020 Comorbid Chronic sinus problems/congestion, Arrhythmia, Hypertension, Weeks Of Treatment: 7 History: Peripheral Arterial Disease, Type II Diabetes, History of Burn, Clustered Wound: No Gout, Confinement Anxiety Photos Photo Uploaded By: Mikeal Hawthorne on 07/13/2020 14:05:39 Wound Measurements Length: (cm) Width: (cm) Depth: (cm) Area: (cm) Volume: (cm) 0 % Reduction in Area: 100% 0 % Reduction in Volume: 100% 0 Epithelialization: Large (67-100%) 0 Tunneling: No 0 Undermining: No Wound Description Classification: Full Thickness Without Exposed Support Structures Wound Margin: Flat and Intact Exudate Amount: None Present Foul Odor After Cleansing: No Slough/Fibrino No Wound Bed Granulation Amount: None Present (0%) Exposed Structure Necrotic Amount: None Present (0%) Fascia Exposed: No Fat Layer (Subcutaneous Tissue) Exposed: No Tendon Exposed: No Muscle Exposed: No Joint Exposed: No Bone Exposed: No Electronic Signature(s) Signed: 07/12/2020 5:28:47 PM By: Baruch Gouty RN, BSN Signed: 07/13/2020 5:37:15 PM By: Levan Hurst RN,  BSN Entered By: Levan Hurst on 07/12/2020 11:57:38 -------------------------------------------------------------------------------- Wound Assessment Details Patient Name: Date of Service: [name], [name] 07/12/2020 11:00 A M Medical Record Number: [id number] Patient  Account Number: [id number] Date of Birth/Sex: Treating RN: 1950-11-25 (69 y.o. [name] Primary Care Shoichi Mielke: [name], [name] Other Clinician: Referring Jules Baty: Treating Samyukta Cura/Extender: Worthy Keeler MCG[facility] [name], [name] in Treatment: 233 Wound Status Wound Number: [id number] Primary Diabetic Wound/Ulcer of the Lower Extremity Etiology: Wound Location: Left, Proximal, Dorsal Foot Wound Open Wounding Event: Blister Status: Date Acquired: 06/07/2020 Comorbid Chronic sinus problems/congestion, Arrhythmia, Hypertension, Weeks Of Treatment: 5 History: Peripheral Arterial Disease, Type II Diabetes, History of Burn, Clustered Wound: No Gout, Confinement Anxiety Photos Photo Uploaded By: Mikeal Hawthorne on 07/13/2020 14:05:39 Wound Measurements Length: (cm) 1 Width: (cm) 1.3 Depth: (cm) 0.1 Area: (cm) 1.021 Volume: (cm) 0.102 % Reduction in Area: -8.4% % Reduction in Volume: -8.5% Epithelialization: Medium (34-66%) Tunneling: No Undermining: No Wound Description Classification: Grade 2 Wound Margin: Flat and Intact Exudate Amount: Medium Exudate Type: Serous Exudate Color: amber Foul Odor After Cleansing: No Slough/Fibrino No Wound Bed Granulation Amount: Large (67-100%) Exposed Structure Granulation Quality: Pink Fascia Exposed: No Necrotic Amount: None Present (0%) Fat Layer (Subcutaneous Tissue) Exposed: Yes Tendon Exposed: No Muscle Exposed: No Joint Exposed: No Bone Exposed: No Treatment Notes Wound #180 (Left, Proximal, Dorsal Foot) 1. Cleanse With Wound Cleanser Soap and water 2. Periwound Care Moisturizing lotion 3. Primary Dressing Applied Calcium Alginate Ag 4. Secondary Dressing ABD Pad 6. Support Layer Applied 4 layer compression wrap Notes stocknette. first layer of unna boot applied to upper portion of lower leg. Electronic Signature(s) Signed: 07/12/2020 5:28:47 PM By: Baruch Gouty RN, BSN Signed: 07/13/2020 5:37:15 PM By: Levan Hurst RN, BSN Entered By: Levan Hurst on 07/12/2020 11:58:20 -------------------------------------------------------------------------------- [place] Details Patient Name: Date of Service: [name], Nalu J. 07/12/2020 11:00 A M Medical Record Number: [id number] Patient Account Number: [id number] Date of Birth/Sex: Treating RN: 08/19/1951 (69 y.o. [name] Primary Care Izreal Kock: [name], [name] Other Clinician: Referring Stacy Sailer: Treating Reagan Behlke/Extender: Worthy Keeler MCGO [name], [name] in Treatment: 233 Vital Signs Time Taken: 11:37 Temperature (F): 98.4 Height (in): 70 Pulse (bpm): 79 Weight (lbs): 380.2 Respiratory Rate (breaths/min): 20 Body Mass Index (BMI): 54.5 Blood Pressure (mmHg): 153/54 Capillary Blood Glucose (mg/dl): 186 Reference Range: 80 - 120 mg / dl Electronic Signature(s) Signed: 07/12/2020 1:03:45 PM By: Sandre Kitty Entered By: Sandre Kitty on 07/12/2020 11:38:04

## 2020-07-19 ENCOUNTER — Encounter (HOSPITAL_BASED_OUTPATIENT_CLINIC_OR_DEPARTMENT_OTHER): Payer: Medicare Other | Admitting: Physician Assistant

## 2020-07-19 ENCOUNTER — Other Ambulatory Visit: Payer: Self-pay

## 2020-07-19 DIAGNOSIS — L03116 Cellulitis of left lower limb: Secondary | ICD-10-CM | POA: Diagnosis not present

## 2020-07-19 DIAGNOSIS — E11622 Type 2 diabetes mellitus with other skin ulcer: Secondary | ICD-10-CM | POA: Diagnosis not present

## 2020-07-19 DIAGNOSIS — L97221 Non-pressure chronic ulcer of left calf limited to breakdown of skin: Secondary | ICD-10-CM | POA: Diagnosis not present

## 2020-07-19 DIAGNOSIS — E1140 Type 2 diabetes mellitus with diabetic neuropathy, unspecified: Secondary | ICD-10-CM | POA: Diagnosis not present

## 2020-07-19 DIAGNOSIS — I87333 Chronic venous hypertension (idiopathic) with ulcer and inflammation of bilateral lower extremity: Secondary | ICD-10-CM | POA: Diagnosis not present

## 2020-07-19 DIAGNOSIS — I890 Lymphedema, not elsewhere classified: Secondary | ICD-10-CM | POA: Diagnosis not present

## 2020-07-19 DIAGNOSIS — L97211 Non-pressure chronic ulcer of right calf limited to breakdown of skin: Secondary | ICD-10-CM | POA: Diagnosis not present

## 2020-07-19 NOTE — Progress Notes (Signed)
TYKEE, HEIDEMAN ([id number]) Visit Report for 07/19/2020 SuperBill Details Patient Name: Date of Service: CO DRAYCEN, LEICHTER 07/19/2020 Medical Record Number: [id number] Patient Account Number: [id number] Date of Birth/Sex: Treating RN: 1951/05/23 (69 y.o. [name], [name] Primary Care Provider: Marney Setting, [name] Other Clinician: Referring Provider: Treating Provider/Extender: Suzy Bouchard, [name] in Treatment: 234 Diagnosis Coding ICD-10 Codes Code Description [phone] Non-pressure chronic ulcer of right calf limited to breakdown of skin L97.221 Non-pressure chronic ulcer of left calf limited to breakdown of skin 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 of bilateral lower extremity I89.0 Lymphedema, not elsewhere classified E11.622 Type 2 diabetes mellitus with other skin ulcer 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, unspecified L03.116 Cellulitis of left lower limb Facility Procedures CPT4 Code Description Modifier Quantity [phone] (Facility Use Only) [phone] - APPLY MULTLAY COMPRS LWR LT LEG 59 1 [id number] (Facility Use Only) 65681EX - APPLY UNNA BOOT RT 1 Electronic Signature(s) Signed: 07/19/2020 5:18:19 PM By: Shawn Stall Signed: 07/19/2020 6:26:36 PM By: Lenda Kelp PA-C Entered By: Shawn Stall on 07/19/2020 12:15:31
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
## 2020-07-21 ENCOUNTER — Other Ambulatory Visit: Payer: Self-pay | Admitting: Family Medicine

## 2020-07-21 DIAGNOSIS — R29898 Other symptoms and signs involving the musculoskeletal system: Secondary | ICD-10-CM | POA: Diagnosis not present

## 2020-07-21 DIAGNOSIS — I890 Lymphedema, not elsewhere classified: Secondary | ICD-10-CM | POA: Diagnosis not present
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
## 2020-07-26 ENCOUNTER — Encounter (HOSPITAL_BASED_OUTPATIENT_CLINIC_OR_DEPARTMENT_OTHER): Payer: Medicare Other | Admitting: Physician Assistant

## 2020-07-26 ENCOUNTER — Other Ambulatory Visit: Payer: Self-pay

## 2020-07-26 DIAGNOSIS — E11622 Type 2 diabetes mellitus with other skin ulcer: Secondary | ICD-10-CM | POA: Diagnosis not present

## 2020-07-26 DIAGNOSIS — L97822 Non-pressure chronic ulcer of other part of left lower leg with fat layer exposed: Secondary | ICD-10-CM | POA: Diagnosis not present

## 2020-07-26 DIAGNOSIS — L97221 Non-pressure chronic ulcer of left calf limited to breakdown of skin: Secondary | ICD-10-CM | POA: Diagnosis not present

## 2020-07-26 DIAGNOSIS — L97522 Non-pressure chronic ulcer of other part of left foot with fat layer exposed: Secondary | ICD-10-CM | POA: Diagnosis not present

## 2020-07-26 DIAGNOSIS — I890 Lymphedema, not elsewhere classified: Secondary | ICD-10-CM | POA: Diagnosis not present

## 2020-07-26 DIAGNOSIS — I87333 Chronic venous hypertension (idiopathic) with ulcer and inflammation of bilateral lower extremity: Secondary | ICD-10-CM | POA: Diagnosis not present

## 2020-07-26 DIAGNOSIS — I87332 Chronic venous hypertension (idiopathic) with ulcer and inflammation of left lower extremity: Secondary | ICD-10-CM | POA: Diagnosis not present

## 2020-07-26 DIAGNOSIS — L97211 Non-pressure chronic ulcer of right calf limited to breakdown of skin: Secondary | ICD-10-CM | POA: Diagnosis not present

## 2020-07-26 DIAGNOSIS — E1140 Type 2 diabetes mellitus with diabetic neuropathy, unspecified: Secondary | ICD-10-CM | POA: Diagnosis not present

## 2020-07-26 DIAGNOSIS — L03116 Cellulitis of left lower limb: Secondary | ICD-10-CM | POA: Diagnosis not present
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
## 2020-07-27 NOTE — Progress Notes (Addendum)
LAKYN, MANTIONE ([id number]) Visit Report for 07/26/2020 Chief Complaint Document Details Patient Name: Date of Service: [name], [name] 07/26/2020 10:15 A M Medical Record Number: [id number] Patient Account Number: [id number] Date of Birth/Sex: Treating RN: 03/31/1951 (69 y.o. [name] Primary Care Provider: Marney Setting, [name] Other Clinician: Referring Provider: Treating Provider/Extender: Lenda Kelp [facility] [name], [name] in Treatment: [phone] Information Obtained from: Patient Chief Complaint patient is here for evaluation venous/lymphedema weeping Electronic Signature(s) Signed: 07/26/2020 10:11:20 AM By: Lenda Kelp PA-C Entered By: Lenda Kelp on 07/26/2020 10:11:20 -------------------------------------------------------------------------------- HPI Details Patient Name: Date of Service: [name], [name] 07/26/2020 10:15 A M Medical Record Number: [id number] Patient Account Number: [id number] Date of Birth/Sex: Treating RN: Mar 20, 1951 (69 y.o. [name] Primary Care Provider: Marney Setting, [name] Other Clinician: Referring Provider: Treating Provider/Extender: Lenda Kelp [name] [name], [name] in Treatment: 235 History of Present Illness HPI Description: Referred by PCP for consultation. Patient has long standing history of BLE venous stasis, no prior ulcerations. At beginning of month, developed cellulitis and weeping. Received IM Rocephin followed by Keflex and resolved. Wears compression stocking, appr 6 months old. Not sure strength. No present drainage. 01/22/16 this is a patient who is a type II diabetic on insulin. 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 He tells me he religiously wears pressure stockings of uncertain strength. He was here with weeping edema about 8 months ago but did not have an open wound. Roughly a month ago he had a reopening on his bilateral legs. He is been using bandages and Neosporin. He  does not complain of pain. 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 Last BUN/creatinine I have is from 11/20/15 at 13 and 1.0 respectively 01/29/16; patient arrives today having tolerated the Profore wrap. He brought in his stockings and these are 18 mmHg stockings he bought from [place]. The compression here is likely inadequate. He does not complain of pain or excessive drainage she has no systemic symptoms. The wound on the right looks improved as does the one on the left although one on the left is more substantial with still tissue at risk below the actual wound area on the bilateral posterior calf 02/05/16; patient arrives with poor edema control. He states that we did put a 4 layer compression on it last week. No weight appear 5 this. 02/12/16; the area on the posterior right Has healed. The left Has a substantial wound that has necrotic surface eschar that requires a debridement with a curette. 02/16/16;the patient called or a Nurse visit secondary to increased swelling. He had been in earlier in the week with his right leg healed. He was transitioned to is on pressure stocking on the right leg with the only open wound on the left, a substantial area on the left posterior calf. Note he has a history of severe lower extremity edema, he has a history of chronic atrial fibrillation but not heart failure per my notes but I'll need to research this. He is not complaining of chest pain shortness of breath or orthopnea. The intake nurse noted blisters on the previously closed right leg 02/19/16; this is the patient's regular visit day. I see him on Friday with escalating edema new wounds on the right leg and clear signs of at least right ventricular heart failure. I increased his Lasix to  40 twice a day. He is returning currently in follow-up. States he is noticed a decrease in that the  edema 02/26/16 patient's legs have much less edema. There is nothing really open on the right leg. The left leg has improved condition of the large superficial wound on the posterior left leg 03/04/16; edema control is very much better. The patient's right leg wounds have healed. On the left leg he continues to have severe venous inflammation on the posterior aspect of the left leg. There is no tenderness and I don't think any of this is cellulitis. 03/11/16; patient's right leg is married healed and he is in his own stocking. The patient's left leg has deteriorated somewhat. There is a lot of erythema around the wound on the posterior left leg. There is also a significant rim of erythema posteriorly just above where the wrap would've ended there is a new wound in this location and a lot of tenderness. Can't rule out cellulitis in this area. 03/15/16; patient's right leg remains healed and he is in his own stocking. The patient's left leg is much better than last review. His major wound on the posterior aspect of his left Is almost fully epithelialized. He has 3 small injuries from the wraps. Really. Erythema seems a lot better on antibiotics 03/18/16; right leg remains healed and he is in his own stocking. The patient's left leg is much better. The area on the posterior aspect of the left calf is fully epithelialized. His 3 small injuries which were wrap injuries on the left are improved only one seems still open his erythema has resolved 03/25/16; patient's right leg remains healed and he is in his own stocking. There is no open area today on the left leg posterior leg is completely closed up. His wrap injuries at the superior aspect of his leg are also resolved. He looks as though he has some irritation on the dorsal ankle but this is fully epithelialized without evidence of infection. 03/28/16; we discharged this patient on Monday. Transitioned him into his own stocking. There were problems almost  immediately with uncontrolled swelling weeping edema multiple some of which have opened. He does not feel systemically unwell in particular no chest pain no shortness of breath and he does not feel 04/08/16; the edema is under better control with the Profore light wrap but he still has pitting edema. There is one large wound anteriorly 2 on the medial aspect of his left leg and 3 small areas on the superior posterior calf. Drainage is not excessive he is tolerating a Profore light well 04/15/16; put a Profore wrap on him last week. This is controlled is edema however he had a lot of pain on his left anterior foot most of his wounds are healed 04/22/16 once again the patient has denuded areas on the left anterior foot which he states are because his wrap slips up word. He saw his primary physician today is on Lasix 40 twice a day and states that he his weight is down 20 pounds over the last 3 months. 04/29/16: Much improved. left anterior foot much improved. He is now on Lasix 80 mg per day. Much improved edema control 05/06/16; I was hoping to be able to discharge him today however once again he has blisters at a low level of where the compression was placed last week mostly on his left lateral but also his left medial leg and a small area on the anterior part of the left foot. 05/09/16;  apparently the patient went home after his appointment on 7/4 later in the evening developing pain in his upper medial thigh together with subjective fever and chills although his temperature was not taken. The pain was so intense he felt he would probably have to call 911. However he then remembered that he had leftover doxycycline from a previous round of antibiotics and took these. By the next morning he felt a lot better. He called and spoke to one of our nurses and I approved doxycycline over the phone thinking that this was in relation to the wounds we had previously seen although they were definitely were not. The  patient feels a lot better old fever no chills he is still working. Blood sugars are reasonably controlled 05/13/16; patient is back in for review of his cellulitis on his anterior medial upper thigh. He is taking doxycycline this is a lot better. Culture I did of the nodular area on the dorsal aspect of his foot grew MRSA this also looks a lot better. 05/20/16; the patient is cellulitis on the medial upper thigh has resolved. All of his wound areas including the left anterior foot, areas on the medial aspect of the left calf and the lateral aspect of the calf at all resolved. He has a new blister on the left dorsal foot at the level of the fourth toe this was excised. No evidence of infection 05/27/16; patient continues to complain weeping edema. He has new blisterlike wounds on the left anterior lateral and posterior lateral calf at the top of his wrap levels. The area on his left anterior foot appears better. He is not complaining of fever, pain or pruritus in his feet. 05/30/16; the patient's blisters on his left anterior leg posterior calf all look improved. He did not increase the Lasix 100 mg as I suggested because he was going to run out of his 40 mg tablets. He is still having weeping edema of his toes 06/03/16; I renewed his Lasix at 80 mg once a day as he was about to run out when I last saw him. He is on 80 mg of Lasix now. I have asked him to cut down on the excessive amount of water he was drinking and asked him to drink according to his thirst mechanisms 06/12/2016 -- was seen 2 days ago and was supposed to wear his compression stockings at home but he is developed lymphedema and superficial blisters on the left lower extremity and hence came in for a review 06/24/16; the remaining wound is on his left anterior leg. He still has edema coming from between his toes. There is lymphedema here however his edema is generally better than when I last saw this. He has a history of atrial fibrillation  but does not have a known history of congestive heart failure nevertheless I think he probably has this at least on a diastolic basis. 07/01/16 I reviewed his echocardiogram from January 2017. This was essentially normal. He did not have LVH, EF of 55-60%. His right ventricular function was normal although he did have trivial tricuspid and pulmonic regurgitation. This is not audible on exam however. I increased his Lasix to do massive edema in his legs well above his knees I think in early July. He was also drinking an excessive amount of water at the time. 07/15/16; missed his appointment last week because of the Labor Day holiday on Monday. He could not get another appointment later in the week. Started to feel the wrap  digging in superiorly so we remove the top half and the bottom half of his wrap. He has extensive erythema and blistering superiorly in the left leg. Very tender. Very swollen. Edema in his foot with leaking edema fluid. He has not been systemically unwell 07/22/16; the area on the left leg laterally required some debridement. The medial wounds look more stable. His wrap injury wounds appear to have healed. Edema and his foot is better, weeping edema is also better. He tells me he is meeting with the supplier of the external compression pumps at work 08/05/16; the patient was on vacation last week in [facility]. His wrap is been on for an extended period of time. Also over the weekend he developed an extensive area of tender erythema across his anterior medial thigh. He took to doxycycline yesterday that he had leftover from a previous prescription. The patient complains of weeping edema coming out of his toes 08/08/16; I saw this patient on 10/2. He was tender across his anterior thigh. I put him on doxycycline. He returns today in follow-up. He does not have any open wounds on his lower leg, he still has edema weeping into his toes. 08/12/16; patient was seen back urgently today to  follow-up for his extensive left thigh cellulitis/erysipelas. He comes back with a lot less swelling and erythema pain is much better. I believe I gave him Augmentin and Cipro. His wrap was cut down as he stated a roll down his legs. He developed blistering above the level of the wrap that remained. He has 2 open blisters and 1 intact. 08/19/16; patient is been doing his primary doctor who is increased his Lasix from 40-80 once a day or 80 already has less edema. Cellulitis has remained improved in the left thigh. 2 open areas on the posterior left calf 08/26/16; he returns today having new open blisters on the anterior part of his left leg. He has his compression pumps but is not yet been shown how to use some vital representative from the supplier. 09/02/16 patient returns today with no open wounds on the left leg. Some maceration in his plantar toes 09/10/2016 -- Dr. Leanord Hawking had recently discharged him on 09/02/2016 and he has come right back with redness swelling and some open ulcers on his left lower extremity. He says this was caused by trying to apply his compression stockings and he's been unable to use this and has not been able to use his lymphedema pumps. He had some doxycycline leftover and he has started on this a few days ago. 09/16/16; there are no open wounds on his leg on the left and no evidence of cellulitis. 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 He does not complain of symptoms here. I am not clear use using his external compression pumps. 09/23/16; I have not seen this patient in 2 weeks. He canceled his appointment 10 days ago as he was going on vacation. He tells me that on Monday he noticed a large area on his posterior left leg which is been draining copiously and is reopened into a large wound. He is been using ABDs and the external part of his juxtalite, according to our nurse this was not on properly. 10/07/16;  Still a substantial area on the posterior left leg. Using silver alginate 10/14/16; in general better although there is still open area which looks healthy. Still using silver alginate. He reminds me that this happen before he  left for [facility]. T oday while he was showering in the morning. He had been using his juxtalite's 10/21/16; the area on his posterior left leg is fully epithelialized. However he arrives today with a large area of tender erythema in his medial and posterior left thigh just above the knee. I have marked the area. Once again he is reluctant to consider hospitalization. I treated him with oral antibiotics in the past for a similar situation with resolution I think with doxycycline however this area it seems more extensive to me. He is not complaining of fever but does have chills and says states he is thirsty. His blood sugar today was in the 140s at home 10/25/16 the area on his posterior left leg is fully epithelialized although there is still some weeping edema. The large area of tenderness and erythema in his medial and posterior left thigh is a lot less tender although there is still a lot of swelling in this thigh. He states he feels a lot better. He is on doxycycline and Augmentin that I started last week. This will continued until Tuesday, December 26. I have ordered a duplex ultrasound of the left thigh rule out DVT whether there is an abscess something that would need to be drained I would also like to know. 11/01/16; he still has weeping edema from a not fully epithelialized area on his left posterior calf. Most of the rest of this looks a lot better. He has completed his antibiotics. His thigh is a lot better. Duplex ultrasound did not show a DVT in the thigh 11/08/16; he comes in today with more Denuded surface epithelium from the posterior aspect of his calf. There is no real evidence of cellulitis. The superior aspect of his wrap appears to have put quite an  indentation in his leg just below the knee and this may have contributed. He does not complain of pain or fever. We have been using silver alginate as the primary dressing. The area of cellulitis in the right thigh has totally resolved. He has been using his compression stockings once a week 11/15/16; the patient arrives today with more loss of epithelium from the posterior aspect of his left calf. He now has a fairly substantial wound in this area. The reason behind this deterioration isn't exactly clear although his edema is not well controlled. He states he feels he is generally more swollen systemically. He is not complaining of chest pain shortness of breath fever. T me he has an appointment with his primary physician in early February. He is on 80 mg of oral ells Lasix a day. He claims compliance with the external compression pumps. He is not having any pain in his legs similar to what he has with his recurrent cellulitis 11/22/16; the patient arrives a follow-up of his large area on his left lateral calf. This looks somewhat better today. He came in earlier in the week for a dressing change since I saw him a week ago. He is not complaining of any pain no shortness of breath no chest pain 11/28/16; the patient arrives for follow-up of his large area on the left lateral calf this does not look better. In fact it is larger weeping edema. The surface of the wound does not look too bad. We have been using silver alginate although I'm not certain that this is a dressing issue. 12/05/16; again the patient follows up for a large wound on the left lateral and left posterior calf this does not  look better. There continues to be weeping edema necrotic surface tissue. More worrisome than this once again there is erythema below the wound involving the distal Achilles and heel suggestive of cellulitis. He is on his feet working most of the day of this is not going well. We are changing his dressing twice a week to  facilitate the drainage. 12/12/16; not much change in the overall dimensions of the large area on the left posterior calf. This is very inflamed looking. I gave him an. Doxycycline last week does not really seem to have helped. He found the wrap very painful indeed it seems to of dog into his legs superiorly and perhaps around the heel. He came in early today because the drainage had soaked through his dressings. 12/19/16- patient arrives for follow-up evaluation of his left lower extremity ulcers. He states that he is using his lymphedema pumps once daily when there is "no drainage". He admits to not using his lipedema pumps while under current treatment. His blood sugars have been consistently between 150-200. 12/26/16; the patient is not using his compression pumps at home because of the wetness on his feet. I've advised him that I think it's important for him to use this daily. He finds his feet too wet, he can put a plastic bag over his legs while he is in the pumps. Otherwise I think will be in a vicious circle. We are using silver alginate to the major area on his left posterior calf 01/02/17; the patient's posterior left leg has further of all into 3 open wounds. All of them covered with a necrotic surface. He claims to be using his compression pumps once a day. His edema control is marginal. Continue with silver alginate 01/10/17; the patient's left posterior leg actually looks somewhat better. There is less edema, less erythema. Still has 3 open areas covered with a necrotic surface requiring debridement. He claims to be using his compression pumps once a day his edema control is better 01/17/17; the patient's left posterior calf look better last week when I saw him and his wrap was changed 2 days ago. He has noted increasing pain in the left heel and arrives today with much larger wounds extensive erythema extending down into the entire heel area especially tender medially. He is not  systemically unwell CBGs have been controlled no fever. Our intake nurse showed me limegreen drainage on his AVD pads. 01/24/17; his usual this patient responds nicely to antibiotics last week giving him Levaquin for presumed Pseudomonas. The whole entire posterior part of his leg is much better much less inflamed and in the case of his Achilles heel area much less tender. He has also had some epithelialization posteriorly there are still open areas here and still draining but overall considerably better 01/31/17- He has continue to tolerate the compression wraps. he states that he continues to use the lymphedema pumps daily, and can increase to twice daily on the weekends. He is voicing no complaints or concerns regarding his LLE ulcers 02/07/17-he is here for follow-up evaluation. He states that he noted some erythema to the left medial and anterior thigh, which he states is new as of yesterday. He is concerned about recurrent cellulitis. He states his blood sugars have been slightly elevated, this morning in the 180s 02/14/17; he is here for follow-up evaluation. When he was last here there was erythema superiorly from his posterior wound in his anterior thigh. He was prescribed Levaquin however a culture of the wound surface  grew MRSA over the phone I changed him to doxycycline on Monday and things seem to be a lot better. 02/24/17; patient missed his appointment on Friday therefore we changed his nurse visit into a physician visit today. Still using silver alginate on the large area of the posterior left thigh. He isn't new area on the dorsal left second toe 03/03/17; actually better today although he admits he has not used his external compression pumps in the last 2 days or so because of work responsibilities over the weekend. 03/10/17; continued improvement. External compression pumps once a day almost all of his wounds have closed on the posterior left calf. Better edema control 03/17/17; in general  improved. He still has 3 small open areas on the lateral aspect of his left leg however most of the area on the posterior part of his leg is epithelialized. He has better edema control. He has an ABD pad under his stocking on the right anterior lower leg although he did not let us look at that today. 03/24/17; patient arrives back in clinic today with no open areas however there are areas on the posterior left calf and anterior left calf that are less than 100% epithelialized. His edema is well controlled in the left lower leg. There is some pitting edema probably lymphedema in the left upper thigh. He uses compression pumps at home once per day. I tried to get him to do this twice a day although he is very reticent. 04/01/2017 -- for the last 2 days he's had significant redness, tenderness and weeping and came in for an urgent visit today. 04/07/17; patient still has 6 more days of doxycycline. He was seen by Dr. Meyer Russel last Wednesday for cellulitis involving the posterior aspect, lateral aspect of his Involving his heel. For the most part he is better there is less erythema and less weeping. He has been on his feet for 12 hours 2 over the weekend. Using his compression pumps once a day 04/14/17 arrives today with continued improvement. Only one area on the posterior left calf that is not fully epithelialized. He has intense bilateral venous inflammation associated with his chronic venous insufficiency disease and secondary lymphedema. We have been using silver alginate to the left posterior calf wound In passing he tells [place] today that the right leg but we have not seen in quite some time has an open area on it but he doesn't want [place] to look at this today states he will show this to [place] next week. 04/21/17; there is no open area on his left leg although he still reports some weeping edema. He showed [place] his right leg today which is the first time we've seen this leg in a long time. He has a large area of  open wound on the right leg anteriorly healthy granulation. Quite a bit of swelling in the right leg and some degree of venous inflammation. He told us about the right leg in passing last week but states that deterioration in the right leg really only happened over the weekend 04/28/17; there is no open area on the left leg although there is an irritated part on the posterior which is like a wrap injury. The wound on the right leg which was new from last week at least to [place] is a lot better. 05/05/17; still no open area on the left leg. Patient is using his new compression stocking which seems to be doing a good job of controlling the edema. He states he  is using his compression pumps once per day. The right leg still has an open wound although it is better in terms of surface area. Required debridement. A lot of pain in the posterior right Achilles marked tenderness. Usually this type of presentation this patient gives concern for an active cellulitis 05/12/17; patient arrives today with his major wound from last week on the right lateral leg somewhat better. Still requiring debridement. He was using his compression stocking on the left leg however that is reopened with superficial wounds anteriorly he did not have an open wound on this leg previously. He is still using his juxta light's once daily at night. He cannot find the time to do this in the morning as he has to be at work by 7 AM 05/19/17; right lateral leg wound looks improved. No debridement required. The concerning area is on the left posterior leg which appears to almost have a subcutaneous hemorrhagic component to it. We've been using silver alginate to all the wounds 05/26/17; the right lateral leg wound continues to look improved. However the area on the left posterior calf is a tightly adherent surface. [place] using silver alginate. Because of the weeping edema in his legs there is very little good alternatives. 06/02/17; the patient left  here last week looking quite good. Major wound on the left posterior calf and a small one on the right lateral calf. Both of these look satisfactory. He tells me that by Wednesday he had noted increased pain in the left leg and drainage. He called on Thursday and Friday to get an appointment here but we were blocked. He did not go to urgent care or his primary physician. He thinks he had a fever on Thursday but did not actually take his temperature. He has not been using his compression pumps on the left leg because of pain. I advised him to go to the emergency room today for IV antibiotics for stents of left leg cellulitis but he has refused I have asked him to take 2 days off work to keep his leg elevated and he has refused this as well. In view of this I'm going to call him and Augmentin and doxycycline. 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 However after a detailed discussion regarding the risk benefits and alternatives he refuses to get admitted to the [name]. With no other recourse I will continue him on oral antibiotics as before and hopefully he'll have his infectious disease consultation this week. 06/16/2017 -- the patient was seen today by the nurse practitioner at infectious disease Ms. Dixon. 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 He has also been advise Lotrimin cream locally. she also had wise IV antibiotics for his condition if it flares up 06/23/17; patient arrives today with drainage bilaterally although the remaining wound on the left posterior calf after  cleaning up today "highlighter yellow drainage" did not look too bad. Unfortunately he has had breakdown on the right anterior leg [previously this leg had not been open and he is using a black stocking] he went  to see infectious disease and is been put on clindamycin 150 mg daily, I did not verify the dose although I'm not familiar with using clindamycin in this dosing range, perhaps for prophylaxisoo 06/27/17; I brought this patient back today to follow-up on the wound deterioration on the right lower leg together with surrounding cellulitis. I started him on doxycycline 4 days ago. This area looks better however he comes in today with intense cellulitis on the medial part of his left thigh. This is not have a wound in this area. Extremely tender. We've been using silver alginate to the wounds on the right lower leg left lower leg with bilateral 4 layer compression he is using his external compression pumps once a day 07/04/17; patient's left medial thigh cellulitis looks better. He has not been using his compression pumps as his insert said it was contraindicated with cellulitis. His right leg continues to make improvements all the wounds are still open. We only have one remaining wound on the left posterior calf. Using silver alginate to all open areas. He is on doxycycline which I started a week ago and should be finishing I gave him Augmentin after Thursday's visit for the severe cellulitis on the left medial thigh which fortunately looks better 07/14/17; the patient's left medial thigh cellulitis has resolved. The cellulitis in his right lower calf on the right also looks better. All of his wounds are stable to improved we've been using silver alginate he has completed the antibiotics I have given him. He has clindamycin 150 mg once a day prescribed by infectious disease for prophylaxis, I've advised him to start this now. We have been using bilateral Unna boots over silver alginate to the wound  areas 07/21/17; the patient is been to see infectious disease who noted his recurrent problems with cellulitis. He was not able to tolerate prophylactic clindamycin therefore he is on amoxicillin 500 twice a day. He also had a second daily dose of Lasix added By Dr. Oneta Rack but he is not taking this. Nor is he being completely compliant with his compression pumps a especially not this week. He has 2 remaining wounds one on the right posterior lateral lower leg and one on the left posterior medial lower leg. 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 The patient has Unna boots bilaterally. 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 He has bilateral Unna boots for compression and silver alginate to his wounds. Arrives today with his legs looking as good as I have seen him in quite some time. Not surprisingly his wounds look better as well with improvement on the right lateral leg venous insufficiency wound and also the left medial leg. He is still using the compression pumps once a day 08/11/17; both legs appear to be doing better wounds on the right lateral and left medial legs look better. Skin on the right leg quite good. He is been using silver alginate as the primary dressing. I'm going to use Anasept gel calcium alginate and maintain all the secondary dressings 08/18/17; the patient continues to actually do quite well. The area on his right lateral leg is just about closed the left medial also looks better although it is still moist in this area. His edema is well controlled we have been using Anasept gel  with calcium alginate and the usual secondary dressings, 4 layer compression and once daily use of his compression pumps "always been able to manage 09/01/17; the patient  continues to do reasonably well in spite of his trip to [place]. The area on the right lateral leg is epithelialized. Left is much better but still open. He has more edema and more chronic erythema on the left leg [venous inflammation] 09/08/17; he arrives today with no open wound on the right lateral leg and decently controlled edema. Unfortunately his left leg is not nearly as in his good situation as last week.he apparently had increasing edema starting on Saturday. He edema soaked through into his foot so used a plastic bag to walk around his home. The area on the medial right leg which was his open area is about the same however he has lost surface epithelium on the left lateral which is new and he has significant pain in the Achilles area of the left foot. He is already on amoxicillin chronically for prophylaxis of cellulitis in the left leg 09/15/17; he is completed a week of doxycycline and the cellulitis in the left posterior leg and Achilles area is as usual improved. He still has a lot of edema and fluid soaking through his dressings. There is no open wound on the right leg. He saw infectious disease NP today 09/22/17;As usual 1 we transition him from our compression wraps to his stockings things did not go well. He has several small open areas on the right leg. He states this was caused by the compression wrap on his skin although he did not wear this with the stockings over them. He has several superficial areas on the left leg medially laterally posteriorly. He does not have any evidence of active cellulitis especially involving the left Achilles The patient is traveling from [facility] Saturday going to [facility]. He states he isn't attempting to get an appointment with a heel objects wound center there to change his dressings. I am not completely certain whether this will work 10/06/17; the patient came in on Friday for a nurse visit and the nurse reported that his legs actually look  quite good. He arrives in clinic today for his regular follow-up visit. He has a new wound on his left third toe over the PIP probably caused by friction with his footwear. He has small areas on the left leg and a very superficial but epithelialized area on the right anterior lateral lower leg. Other than that his legs look as good as I've seen him in quite some time. We have been using silver alginate Review of systems; no chest pain no shortness of breath other than this a 10 point review of systems negative 10/20/17; seen by Dr. Meyer Russel last week. He had taken some antibiotics [doxycycline] that he had left over. Dr. Meyer Russel thought he had candida infection and declined to give him further antibiotics. He has a small wound remaining on the right lateral leg several areas on the left leg including a larger area on the left posterior several left medial and anterior and a small wound on the left lateral. The area on the left dorsal third toe looks a lot better. ROS; Gen.; no fever, respiratory no cough no sputum Cardiac no chest pain other than this 10 point review of system is negative 10/30/17; patient arrives today having fallen in the bathtub 3 days ago. It took him a while to get up. He has pain and maceration in the  wounds on his left leg which have deteriorated. He has not been using his pumps he also has some maceration on the right lateral leg. 11/03/17; patient continues to have weeping edema especially in the left leg. This saturates his dressings which were just put on on 12/27. As usual the doxycycline seems to take care of the cellulitis on his lower leg. He is not complaining of fever, chills, or other systemic symptoms. He states his leg feels a lot better on the doxycycline I gave him empirically. He also apparently gets injections at his primary doctor's officeo Rocephin for cellulitis prophylaxis. I didn't ask him about his compression pump compliance today I think that's probably  marginal. Arrives in the clinic with all of his dressings primary and secondary macerated full of fluid and he has bilateral edema 11/10/17; the patient's right leg looks some better although there is still a cluster of wounds on the right lateral. The left leg is inflamed with almost circumferential skin loss medially to laterally although we are still maintaining anteriorly. He does not have overt cellulitis there is a lot of drainage. He is not using compression pumps. We have been using silver alginate to the wound areas, there are not a lot of options here 11/17/17; the patient's right leg continues to be stable although there is still open wounds, better than last week. The inflammation in the left leg is better. Still loss of surface layer epithelium especially posteriorly. There is no overt cellulitis in the amount of edema and his left leg is really quite good, tells me he is using his compression pumps once a day. 11/24/17; patient's right leg has a small superficial wound laterally this continues to improve. The inflammation in the left leg is still improving however we have continuous surface layer epithelial loss posteriorly. There is no overt cellulitis in the amount of edema in both legs is really quite good. He states he is using his compression pumps on the left leg once a day for 5 out of 7 days 12/01/17; very small superficial areas on the right lateral leg continue to improve. Edema control in both legs is better today. He has continued loss of surface epithelialization and left posterior calf although I think this is better. We have been using silver alginate with large number of absorptive secondary dressings 4 layer on the left Unna boot on the right at his request. He tells me he is using his compression pumps once a day 12/08/17; he has no open area on the right leg is edema control is good here. On the left leg however he has marked erythema and tenderness breakdown of skin. He has  what appears to be a wrap injury just distal to the popliteal fossa. 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 There was swelling erythema and breakdown in the right leg. He has much worse skin breakdown in the left leg as well multiple open areas medially and posteriorly as well as laterally. He tells me he has been using his compression pumps but tells me he feels that the drainage out of his leg is worse when he uses a compression pumps. T be fair to him he is been saying this o for a  while however I don't know that I have really been listening to this. I wonder if the compression pumps are working properly 12/22/17;. Once again he arrives with severe erythema, weeping edema from the left greater than right leg. Noncompliance with compression pumps. New this visit he is complaining of pain on the lateral aspect of the right leg and the medial aspect of his right thigh. He apparently saw his cardiologist Dr. [name] who was ordered an echocardiogram area and I think this is a step in the right direction 12/25/17; started his doxycycline Monday night. There is still intense erythema of the right leg especially in the anterior thigh although there is less tenderness. The erythema around the wound on the right lateral calf also is less tender. He still complaining of pain in the left heel. His wounds are about the same right lateral left medial left lateral. Superficial but certainly not close to closure. He denies being systemically unwell no fever chills no abdominal pain no diarrhea 12/29/17; back in follow-up of his extensive right calf and right thigh cellulitis. I added amoxicillin to cover possible doxycycline resistant  strep. This seems to of done the trick he is in much less pain there is much less erythema and swelling. He has his echocardiogram at 11:00 this morning. X-ray of the left heel was also negative. 01/05/18; the patient arrived with his edema under much better control. Now that he is retired he is able to use his compression pumps daily and sometimes twice a day per the patient. He has a wound on the right leg the lateral wound looks better. Area on the left leg also looks a lot better. He has no evidence of cellulitis in his bilateral thighs I had a quick peak at his echocardiogram. He is in normal ejection fraction and normal left ventricular function. He has moderate pulmonary hypertension moderately reduced right ventricular function. One would have to wonder about chronic sleep apnea although he says he doesn't snore. He'll review the echocardiogram with his cardiologist. 01/12/18; the patient arrives with the edema in both legs under exemplary control. He is using his compression pumps daily and sometimes twice daily. His wound on the right lateral leg is just about closed. He still has some weeping areas on the posterior left calf and lateral left calf although everything is just about closed here as well. I have spoken with Aldean Baker who is the patient's nurse practitioner and infectious disease. She was concerned that the patient had not understood that the parenteral penicillin injections he was receiving for cellulitis prophylaxis was actually benefiting him. I don't think the patient actually saw that I would tend to agree we were certainly dealing with less infections although he had a serious one last month. 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 He is healed. He'll be placed in juxtalite compression wraps and increase his lymphedema pumps to twice daily. We will follow up again next week to ensure there are no issues with the new  regiment. 01/20/18-he is here for evaluation of bilateral lower extremity weeping edema. Yesterday he was placed in compression wrap to the right lower extremity and compression stocking to left lower shrubbery. He states he uses lymphedema pumps last night and again this morning and noted a blister to the left lower extremity. On exam he was noted to have drainage to the right lower extremity. He will be placed in Unna boots bilaterally and follow-up next week 01/26/18; patient was  actually discharged a week ago to his own juxta light stockings only to return the next day with bilateral lower extremity weeping edema.he was placed in bilateral Unna boots. He arrives today with pain in the back of his left leg. There is no open area on the right leg however there is a linear/wrap injury on the left leg and weeping edema on the left leg posteriorly. I spoke with infectious disease about 10 days ago. They were disappointed that the patient elected to discontinue prophylactic intramuscular penicillin shots as they felt it was particularly beneficial in reducing the frequency of his cellulitis. I discussed this with the patient today. He does not share this view. He'll definitely need antibiotics today. Finally he is traveling to [place] and trauma leaving this Saturday and returning a week later and he does not travel with his pumps. He is going by car 01/30/18; patient was seen 4 days ago and brought back in today for review of cellulitis in the left leg posteriorly. I put him on amoxicillin this really hasn't helped as much as I might like. He is also worried because he is traveling to [facility] trauma by car. Finally we will be rewrapping him. There is no open area on the right leg over his left leg has multiple weeping areas as usual 02/09/18; The same wrap on for 10 days. He did not pick up the last doxycycline I prescribed for him. He apparently took 4 days worth he already had. There is nothing open on  his right leg and the edema control is really quite good. He's had damage in the left leg medially and laterally especially probably related to the prolonged use of Unna boots 02/12/18; the patient arrived in clinic today for a nurse visit/wrap change. He complained of a lot of pain in the left posterior calf. He is taking doxycycline that I previously prescribed for him. Unfortunately even though he used his stockings and apparently used to compression pumps twice a day he has weeping edema coming out of the lateral part of his right leg. This is coming from the lower anterior lateral skin area. 02/16/18; the patient has finished his doxycycline and will finish the amoxicillin 2 days. The area of cellulitis in the left calf posteriorly has resolved. He is no longer having any pain. He tells me he is using his compression pumps at least once a day sometimes twice. 02/23/18; the patient finished his doxycycline and Amoxil last week. On Friday he noticed a small erythematous circle about the size of a quarter on the left lower leg just above his ankle. This rapidly expanded and he now has erythema on the lateral and posterior part of the thigh. This is bright red. Also has an area on the dorsal foot just above his toes and a tender area just below the left popliteal fossa. He came off his prophylactic penicillin injections at his own insistence one or 2 months ago. This is obviously deteriorated since then 03/02/18; patient is on doxycycline and Amoxil. Culture I did last week of the weeping area on the back of his left calf grew group B strep. I have therefore renewed the amoxicillin 500 3 times a day for a further week. He has not been systemically unwell. Still complaining of an area of discomfort right under his left popliteal fossa. There is no open wound on the right leg. He tells me that he is using his pumps twice a day on most days 03/09/18; patient arrives in  clinic today completing his amoxicillin  today. The cellulitis on his left leg is better. Furthermore he tells me that he had intramuscular penicillin shots that his primary care office today. However he also states that the wrap on his right leg fell down shortly after leaving clinic last week. He developed a large blister that was present when he came in for a nurse visit later in the week and then he developed intense discomfort around this area.He tells me he is using his compression pumps 03/16/18; the patient has completed his doxycycline. The infectious part of this/cellulitis in the left heel area left popliteal area is a lot better. He has 2 open areas on the right calf. Still areas on the left calf but this is a lot better as well. 03/24/18; the patient arrives complaining of pain in the left popliteal area again. He thinks some of this is wrap injury. He has no open area on the right leg and really no open area on the left calf either except for the popliteal area. He claims to be compliant with the compression pumps 03/31/18; I gave him doxycycline last week because of cellulitis in the left popliteal area. This is a lot better although the surface epithelium is denuded off and response to this. He arrives today with uncontrolled edema in the right calf area as well as a fingernail injury in the right lateral calf. There is only a few open areas on the left 04/06/18; I gave him amoxicillin doxycycline over the last 2 weeks that the amoxicillin should be completing currently. He is not complaining of any pain or systemic symptoms. The only open areas see has is on the right lateral lower leg paradoxically I cannot see anything on the left lower leg. He tells me he is using his compression pumps twice a day on most days. Silver alginate to the wounds that are open under 4 layer compression 04/13/18; he completed antibiotics and has no new complaints. Using his compression pumps. Silver alginate that anything that's opened 04/20/18; he is  using his compression pumps religiously. Silver alginate 4 layer compression anything that's opened. He comes in today with no open wounds on the left leg but 3 on the right including a new one posteriorly. He has 2 on the right lateral and one on the right posterior. He likes Unna boots on the right leg for reasons that aren't really clear we had the usual 4 layer compression on the left. It may be necessary to move to the 4 layer compression on the right however for now I left them in the Unna boots 04/27/18; he is using his compression pumps at least once a day. He has still the wounds on the right lateral calf. The area right posteriorly has closed. He does not have an open wound on the left under 4 layer compression however on the dorsal left foot just proximal to the toes and the left third toe 2 small open areas were identified 05/11/18; he has not uses compression pumps. The areas on the right lateral calf have coalesced into one large wound necrotic surface. On the left side he has one small wound anteriorly however the edema is now weeping out of a large part of his left leg. He says he wasn't using his pumps because of the weeping fluid. I explained to him that this is the time he needs to pump more 05/18/18; patient states he is using his compression pumps twice a day. The area on  the right lateral large wound albeit superficial. On the left side he has innumerable number of small new wounds on the left calf particularly laterally but several anteriorly and medially. All these appear to have healthy granulated base these look like the remnants of blisters however they occurred under compression. The patient arrives in clinic today with his legs somewhat better. There is certainly less edema, less multiple open areas on the left calf and the right anterior leg looks somewhat better as well superficial and a little smaller. However he relates pain and erythema over the last 3-4 days in the thigh  and I looked at this today. He has not been systemically unwell no fever no chills no change in blood sugar values 05/25/18; comes in today in a better state. The severe cellulitis on his left leg seems better with the Keflex. Not as tender. He has not been systemically unwell Hard to find an open wound on the left lower leg using his compression pumps twice a day The confluent wounds on his right lateral calf somewhat better looking. These will ultimately need debridement I didn't do this today. 06/01/18; the severe cellulitis on the left anterior thigh has resolved and he is completed his Keflex. There is no open wound on the left leg however there is a superficial excoriation at the base of the third toe dorsally. Skin on the bottom of his left foot is macerated looking. The left the wounds on the lateral right leg actually looks some better although he did require debridement of the top half of this wound area with an open curet 06/09/18 on evaluation today patient appears to be doing poorly in regard to his right lower extremity in particular this appears to likely be infected he has very thick purulent discharge along with a bright green tent to the discharge. This makes me concerned about the possibility of pseudomonas. He's also having increased discomfort at this point on evaluation. Fortunately there does not appear to be any evidence of infection spreading to the other location at this time. 06/16/18 on evaluation today patient appears to actually be doing fairly well. His ulcer has actually diminished in size quite significantly at this point which is good news. Nonetheless he still does have some evidence of infection he did see infectious disease this morning before coming here for his appointment. I did review the results of their evaluation and their note today. They did actually have him discontinue the Cipro and initiate treatment with linezolid at this time. He is doing this for the  next seven days and they recommended a follow-up in four months with them. He is the keep a log of the need for intermittent antibiotic therapy between now and when he falls back up with infectious disease. This will help them gaze what exactly they need to do to try and help them out. 06/23/18; the patient arrives today with no open wounds on the left leg and left third toe healed. He is been using his compression pumps twice a day. On the right lateral leg he still has a sizable wound but this is a lot better than last time I saw this. 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 Has been using silver alginate under 4 layer compression 06/30/18; the only open wound he has is on the right lateral leg and this looks healthy. No debridement is required. We have  been using silver alginate. He does not have an open wound on the left leg. There is apparently some drainage from the dorsal proximal third toe on the left although I see no open wound here. 07/03/18 on evaluation today patient was actually here just for a nurse visit rapid change. However when he was here on Wednesday for his rat change due to having been healed on the left and then developing blisters we initiated the wrap again knowing that he would be back today for [place] to reevaluate and see were at. Unfortunately he has developed some cellulitis into the proximal portion of his right lower extremity even into the region of his thigh. 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 He was placed on one as what at that point. Nonetheless he is done with that and has been tolerating it well otherwise. Doxycycline which in the past really did not seem to be effective for him. Nonetheless I think the best option may be for [place] to definitely reinitiate the antibiotics for a longer period of time. 07/07/18; since I  last saw this patient a week ago he has had a difficult time. At that point he did not have an open wound on his left leg. We transitioned him into juxta light stockings. He was apparently in the clinic the next day with blisters on the left lateral and left medial lower calf. He also had weeping edema fluid. He was put back into a compression wrap. He was also in the clinic on Friday with intense erythema in his right thigh. Per the patient he was started on Bactrim however that didn't work at all in terms of relieving his pain and swelling. He has taken 3 doxycycline that he had left over from last time and that seems to of helped. He has blistering on the right thigh as well. 07/14/18; the erythema on his right thigh has gotten better with doxycycline that he is finishing. 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 Presumably this cellulitis in the thigh was not related to that although I think this is a bit concerning going forward. He still has an area on the right lateral calf the blister on the right medial calf just below the knee that was discussed above. On the left 2 small open areas left medial and left lateral. Edema control is adequate. He is using his compression pumps twice a day 07/20/18; continued improvement in the condition of both legs especially the edema in his bilateral thighs. He tells me he is been losing weight through a combination of diet and exercise. He is using his compression pumps twice a day. So overall she made to the remaining wounds 07/27/2018; continued improvement in condition of both legs. His edema is well controlled. The area on the right lateral leg is just about closed he had one blisters show up on the medial left upper calf. We have him in 4 layer compression. He is going on a 10-day trip to [place], [place] and [place]. He will be driving. He wants to wear Unna boots because of  the lessening amount of constriction. He will not use compression pumps while he is away 08/05/18 on evaluation today patient actually appears to be doing decently well all things considered in regard to his bilateral lower extremities. The worst ulcer is actually only posterior aspect of  his left lower extremity with a four layer compression wrap cut into his leg a couple weeks back. He did have a trip and actually had [employer] for the trip that he is worn since he was last here. Nonetheless he feels like the [employer] actually do better for him his swelling is up a little bit but he also with his trip was not taking his Lasix on a regular set schedule like he was supposed to be. He states that obviously the reason being that he cannot drive and keep going without having to urinate too frequently which makes it difficult. He did not have his pumps with him while he was away either which I think also maybe playing a role here too. 08/13/2018; the patient only has a small open wound on the right lateral calf which is a big improvement in the last month or 2. He also has the area posteriorly just below the posterior fossa on the left which I think was a wrap injury from several weeks ago. He has no current evidence of cellulitis. He tells me he is back into his compression pumps twice a day. He also tells me that while he was at the laundromat somebody stole a section of his extremitease stockings 08/20/2018; back in the clinic with a much improved state. He only has small areas on the right lateral mid calf which is just about healed. This was is more substantial area for quite a prolonged period of time. He has a small open area on the left anterior tibia. The area on the posterior calf just below the popliteal fossa is closed today. He is using his compression pumps twice a day 08/28/2018; patient has no open wound on the right leg. He has a smattering of open areas on the calf with some  weeping lymphedema. More problematically than that it looks as though his wraps of slipped down in his usual he has very angry upper area of edema just below the right medial knee and on the right lateral calf. He has no open area on his feet. The patient is traveling to [name] next week. I will send him in an antibiotic. We will continue to wrap the right leg. We ordered extremitease stockings for him last week and I plan to transition the right leg to a stocking when he gets home which will be in 10 days time. As usual he is very reluctant to take his pumps with him when he travels 09/07/2018; patient returns from [name]. He shows me a picture of his left leg in the mid part of his trip last week with intense fire engine erythema. The picture look bad enough I would have considered sending him to the [name]. Instead he went to the wound care center in [facility]. They did not prescribe him antibiotics but he did take some doxycycline he had leftover from a previous visit. I had given him trimethoprim sulfamethoxazole before he left this did not work according to the patient. This is resulted in some improvement fortunately. He comes back with a large wound on the left posterior calf. Smaller area on the left anterior tibia. Denuded blisters on the dorsal left foot over his toes. Does not have much in the way of wounds on the right leg although he does have a very tender area on the right posterior area just below the popliteal fossa also suggestive of infection. He promises me he is back on his  pumps twice a day 09/15/2018; the intense cellulitis in his left lower calf is a lot better. The wound area on the posterior left calf is also so better. However he has reasonably extensive wounds on the dorsal aspect of his second and third toes and the proximal foot just at the base of the toes. There is nothing open on the right leg 09/22/2018; the patient has excellent edema  control in his legs bilaterally. He is using his external compression pumps twice a day. He has no open area on the right leg and only the areas in the left foot dorsally second and third toe area on the left side. He does not have any signs of active cellulitis. 10/06/2018; the patient has good edema control bilaterally. He has no open wound on the right leg. There is a blister in the posterior aspect of his left calf that we had to deal with today. He is using his compression pumps twice a day. There is no signs of active cellulitis. We have been using silver alginate to the wound areas. He still has vulnerable areas on the base of his left first second toes dorsally He has a his extremities stockings and we are going to transition him today into the stocking on the right leg. He is cautioned that he will need to continue to use the compression pumps twice a day. If he notices uncontrolled edema in the right leg he may need to go to 3 times a day. 10/13/2018; the patient came in for a nurse check on Friday he has a large flaccid blister on the right medial calf just below the knee. We unroofed this. He has this and a new area underneath the posterior mid calf which was undoubtedly a blister as well. He also has several small areas on the right which is the area we put his extremities stocking on. 10/19/2018; the patient went to see infectious disease this morning I am not sure if that was a routine follow-up in any case the doxycycline I had given him was discontinued and started on linezolid. He has not started this. It is easy to look at his left calf and the inflammation and think this is cellulitis however he is very tender in the tissue just below the popliteal fossa and I have no doubt that there is infection going on here. He states the problem he is having is that with the compression pumps the edema goes down and then starts walking the wrap falls down. We will see if we can adhere this. He  has 1 or 2 minuscule open areas on the right still areas that are weeping on the posterior left calf, the base of his left second and third toes 10/26/18; back today in clinic with quite of skin breakdown in his left anterior leg. This may have been infection the area below the popliteal fossa seems a lot better however tremendous epithelial loss on the left anterior mid tibia area over quite inexpensive tissue. He has 2 blisters on the right side but no other open wound here. 10/29/2018; came in urgently to see [place] today and we worked him in for review. He states that the 4 layer compression on the right leg caused pain he had to cut it down to roughly his mid calf this caused swelling above the wrap and he has blisters and skin breakdown today. As a result of the pain he has not been using his pumps. Both legs are a lot more  edematous and there is a lot of weeping fluid. 11/02/18; arrives in clinic with continued difficulties in the right leg> left. Leg is swollen and painful. multiple skin blisters and new open areas especially laterally. He has not been using his pumps on the right leg. 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 He is not systemically unwell. 11/09/2018; the patient claims he is being compliant with his pumps. He is finished the doxycycline I gave him last week. Culture I did of the wound on the right lateral leg showed a few very resistant methicillin staph aureus. This was resistant to doxycycline. Nevertheless he states the pain in the leg is a lot better which makes me wonder if the cultured organism was not really what was causing the problem nevertheless this is a very dangerous organism to be culturing out of any wound. His right leg is still a lot larger than the left. He is using an Radio broadcast assistant on this area, he blames a 4-layer compression for causing the original skin breakdown which I doubt is true however I cannot talk him out  of it. We have been using silver alginate to all of these areas which were initially blisters 11/16/2018; patient is being compliant with his external compression pumps at twice a day. Miraculously he arrives in clinic today with absolutely no open wounds. He has better edema control on the left where he has been using 4 layer compression versus wound of wounds on the right and I pointed this out to him. There is no inflammation in the skin in his lower legs which is also somewhat unusual for him. There is no open wounds on the dorsal left foot. He has extremitease stockings at home and I have asked him to bring these in next week. 11/25/18 patient's lower extremity on examination today on the left appears for the most part to be wound free. He does have an open wound on the lateral aspect of the right lower extremity but this is minimal compared to what I've seen in past. He does request that we go ahead and wrap the left leg as well even though there's nothing open just so hopefully it will not reopen in short order. 1/28; patient has superficial open wounds on the right lateral calf left anterior calf and left posterior calf. His edema control is adequate. 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 We have been using silver alginate as the primary dressing. He claims compliance with his compression pumps 2/4; patient has superficial open wounds on numerous areas of his left calf and again one on the left dorsal foot. The areas on the right lateral calf have healed. The cellulitis that I gave him doxycycline for last week is also resolved this was mostly on the left anterior calf just below the tibial tuberosity. His edema looks fairly well-controlled. He tells me he went to see his primary doctor today and had blood work ordered 2/11; once again he has several open areas on the left calf left tibial area. Most of  these are small and appear to have healthy granulation. He does not have anything open on the right. The edema and control in his thighs is pretty good which is usually a good indication he has been using his pumps as requested. 2/18; he continues to have several small areas on the left calf and left tibial  area. Most of these are small healthy granulation. We put him in his stocking on the right leg last week and he arrives with a superficial open area over the right upper tibia and a fairly large area on the right lateral tibia in similar condition. His edema control actually does not look too bad, he claims to be using his compression pumps twice a day 2/25. Continued small areas on the left calf and left tibial area. New areas especially on the right are identified just below the tibial tuberosity and on the right upper tibia itself. There are also areas of weeping edema fluid even without an obvious wound. He does not have a considerable degree of lymphedema but clearly there is more edema here than his skin can handle. He states he is using the pumps twice a day. We have an Unna boot on the right and 4 layer compression on the left. 3/3; he continues to have an area on the right lateral calf and right posterior calf just below the popliteal fossa. There is a fair amount of tenderness around the wound on the popliteal fossa but I did not see any evidence of cellulitis, could just be that the wrap came down and rubbed in this area. He does not have an open area on the left leg however there is an area on the left dorsal foot at the base of the third toe We have been using silver alginate to all wound areas 3/10; he did not have an open area on his left leg last time he was here a week ago. T oday he arrives with a horizontal wound just below the tibial tuberosity and an area on the left lateral calf. He has intense erythema and tenderness in this area. The area is on the right lateral calf and  right posterior calf better than last week. We have been using silver alginate as usual 3/18 - Patient returns with 3 small open areas on left calf, and 1 small open area on right calf, the skin looks ok with no significant erythema, he continues the UNA boot on right and 4 layer compression on left. The right lateral calf wound is closed , the right posterior is small area. we will continue silver alginate to the areas. 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 He is been tolerating the dressing changes without complication. Fortunately she has made excellent improvement in regard to the overall status of his wounds. Unfortunately every time we cease wrapping him he ends up reopening in causing more significant issues at that point. Again I'm unsure of the best direction to take although I think the lymphedema clinic may be appropriate for him. 02/03/19 on evaluation today patient appears to be doing well in regard to the wounds that we saw him for last week unfortunately he has a new area on the proximal portion of his right medial/posterior lower extremity where the wrap somewhat slowed down and caused swelling and a blister to rub and open. Unfortunately this is the only opening that he has on either leg at this point. 02/17/19 on evaluation today patient's bilateral lower extremities appear to be doing well. He still completely healed in regard to the left lower extremity. In regard to the right lower extremity the area where the wrap and slid down  and caused the blister still seems to be slightly open although this is dramatically better than during the last evaluation two weeks ago. I'm very pleased with the way this stands overall. 03/03/19 on evaluation today patient appears to be doing well in regard  to his right lower extremity in general although he did have a new blister open this does not appear to be showing any evidence of active infection at this time. Fortunately there's No fevers, chills, nausea, or vomiting noted at this time. Overall I feel like he is making good progress it does feel like that the right leg will we perform the [employer] seems to do with a bit better than three layer wrap on the left which slid down on him. We may switch to doing bilateral in the book wraps. 5/4; I have not seen Mr. Goodenow in quite some time. According to our case manager he did not have an open wound on his left leg last week. He had 1 remaining wound on the right posterior medial calf. He arrives today with multiple openings on the left leg probably were blisters and/or wrap injuries from Unna boots. I do not think the Unna boot's will provide adequate compression on the left. I am also not clear about the frequency he is using the compression pumps. 03/17/19 on evaluation today patient appears to be doing excellent in regard to his lower extremities compared to last week's evaluation apparently. He had gotten significantly worse last week which is unfortunate. The [employer] wrap on the left did not seem to do very well for him at all and in fact it didn't control his swelling significantly enough he had an additional outbreak. Subsequently we go back to the four layer compression wrap on the left. This is good news. At least in that he is doing better and the wound seem to be killing him. He still has not heard anything from the lymphedema clinic. 03/24/19 on evaluation today patient actually appears to be doing much better in regard to his bilateral lower Trinity as compared to last week when I saw him. Fortunately there's no signs of active infection at this time. He has been tolerating the dressing changes without complication. Overall I'm extremely pleased with the progress and appearance in  general. 04/07/19 on evaluation today patient appears to be doing well in regard to his bilateral lower extremities. His swelling is significantly down from where it was previous. With that being said he does have a couple blisters still open at this point but fortunately nothing that seems to be too severe and again the majority of the larger openings has healed at this time. 04/14/19 on evaluation today patient actually appears to be doing quite well in regard to his bilateral lower extremities in fact I'm not even sure there's anything significantly open at this time at any site. Nonetheless he did have some trouble with these wraps where they are somewhat irritating him secondary to the fact that he has noted that the graph wasn't too close down to the end of this foot in a little bit short as well up to his knee. Otherwise things seem to be doing quite well. 04/21/19 upon evaluation today patient's wound bed actually showed evidence of being completely healed in regard to both lower extremities which is excellent news. There does not appear to be any signs of active infection which is also good news. I'm very pleased in this regard. No fevers, chills, nausea, or vomiting noted  at this time. 04/28/19 on evaluation today patient appears to be doing a little bit worse in regard to both lower extremities on the left mainly due to the fact that when he went infection disease the wrap was not wrapped quite high enough he developed a blister above this. On the right he is a small open area of nothing too significant but again this is continuing to give him some trouble he has been were in the Velcro compression that he has at home. 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 He's been tolerating the dressing changes without complication. Fortunately there's no signs of active infection at this time. No fevers, chills, nausea, or vomiting noted at  this time. We have been trying to get an appointment with her lymphedema clinic in [name] but unfortunately nobody can get them on phone with not been able to even fax information over the patient likewise is not been able to get in touch with them. Overall I'm not sure exactly what's going on here with to reach out again today. 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 Still having a lot of drainage unfortunately. He tells me especially in the left but even on the right. There's no signs of active infection which is good news we've been using so ratcheted up to this point. 05/19/19 on evaluation today patient actually appears to be doing quite well with regard to his left lower extremity which is great news. Fortunately in regard to the right lower extremity has an issues with his wrap and he subsequently did remove this from what I'm understanding. Nonetheless long story short is what he had rewrapped once he removed it subsequently had maggots underneath this wrap whenever he came in for evaluation today. With that being said they were obviously completely cleaned away by the nursing staff. The visit today which is excellent news. However he does appear to potentially have some infection around the right ankle region where the maggots were located as well. He will likely require anabiotic therapy today. 05/26/19 on evaluation today patient actually appears to be doing much better in regard to his bilateral lower extremities. I feel like the infection is under much better control. With that being said there were maggots noted when the wrap was removed yet again today. Again this could have potentially been left over from previous although at this time there does not appear to be any signs of significant drainage there was obviously on the wrap some drainage as well this contracted gnats or  otherwise. Either way I do not see anything that appears to be doing worse in my pinion and in fact I think his drainage has slowed down quite significantly likely mainly due to the fact to his infection being under better control. 06/02/2019 on evaluation today patient actually appears to be doing well with regard to his bilateral lower extremities there is no signs of active infection at this time which is great news. With that being said he does have several open areas more so on the right than the left but nonetheless these are all significantly better than previously noted. 06/09/2019 on evaluation today patient actually appears to be doing well. His wrap stayed up and he did not cause any problems he had more drainage on the right compared to the left but overall I do not see  any major issues at this time which is great news. 06/16/2019 on evaluation today patient appears to be doing excellent with regard to his lower extremities the only area that is open is a new blister that can have opened as of today on the medial ankle on the left. Other than this he really seems to be doing great I see no major issues at this point. 06/23/2019 on evaluation today patient appears to be doing quite well with regard to his bilateral lower extremities. In fact he actually appears to be almost completely healed there is a small area of weeping noted of the right lower extremity just above the ankle. Nonetheless fortunately there is no signs of active infection at this time which is good news. No fevers, chills, nausea, vomiting, or diarrhea. 8/24; the patient arrived for a nurse visit today but complained of very significant pain in the left leg and therefore I was asked to look at this. Noted that he did not have an open area on the left leg last week nevertheless this was wrapped. The patient states that he is not been able to put his compression pumps on the left leg because of the discomfort. He has not been  systemically unwell 06/30/2019 on evaluation today patient unfortunately despite being excellent last week is doing much worse with regard to his left lower extremity today. In fact he had to come in for a nurse on Monday where his left leg had to be rewrapped due to excessive weeping Dr. Leanord Hawking placed him on doxycycline at that point. Fortunately there is no signs of active infection Systemically at this time which is good news. 07/07/2019 in regard to the patient's wounds today he actually seems to be doing well with his right lower extremity there really is nothing open or draining at this point this is great news. Unfortunately the left lower extremity is given him additional trouble at this time. There does not appear to be any signs of active infection nonetheless he does have a lot of edema and swelling noted at this point as well as blistering all of which has led to a much more poor appearing leg at this time compared to where it was 2 weeks ago when it was almost completely healed. Obviously this is a little discouraging for the patient. He is try to contact the lymphedema clinic in [place] he has not been able to get through to them. 07/14/2019 on evaluation today patient actually appears to be doing slightly better with regard to his left lower extremity ulcers. Overall I do feel like at least at the top of the wrap that we have been placing this area has healed quite nicely and looks much better. The remainder of the leg is showing signs of improvement. Unfortunately in the thigh area he still has an open region on the left and again on the right he has been utilizing just a Band-Aid on an area that also opened on the thigh. Again this is an area that were not able to wrap although we did do an Ace wrap to provide some compression that something that obviously is a little less effective than the compression wraps we have been using on the lower portion of the leg. He does have an  appointment with the lymphedema clinic in [facility] on Friday. 07/21/2019 on evaluation today patient appears to be doing better with regard to his lower extremity ulcers. He has been tolerating the dressing changes without complication. Fortunately there is  no signs of active infection at this time. No fevers, chills, nausea, vomiting, or diarrhea. I did receive the paperwork from the physical therapist at the lymphedema clinic in [place]. Subsequently I signed off on that this morning and sent that back to him for further progression with the treatment plan. 07/28/2019 on evaluation today patient appears to be doing very well with regard to his right lower extremity where I do not see any open wounds at this point. Fortunately he is feeling great as far as that is concerned as well. In regard to the left lower extremity he has been having issues with still several areas of weeping and edema although the upper leg is doing better his lower leg still I think is going require the compression wrap at this time. No fevers, chills, nausea, vomiting, or diarrhea. 08/04/2019 on evaluation today patient unfortunately is having new wounds on the right lower extremity. Again we have been using Unna boot wrap on that side. We switched him to using his juxta lite wrap at home. With that being said he tells me he has been using it although his legs extremely swollen and to be honest really does not appear that he has been. I cannot know that for sure however. Nonetheless he has multiple new wounds on the right lower extremity at this time. Obviously we will have to see about getting this rewrapped for him today. 08/11/2019 on evaluation today patient appears to be doing fairly well with regard to his wounds. He has been tolerating the dressing changes including the compression wraps without complication. He still has a lot of edema in his upper thigh regions bilaterally he is supposed to be seeing the  lymphedema clinic on the 15th of this month once his wraps arrive for the upper part of his legs. 08/18/2019 on evaluation today patient appears to be doing well with regard to his bilateral lower extremities at this point. He has been tolerating the dressing changes without complication. Fortunately there is no signs of active infection which is also good news. He does have a couple weeping areas on the first and second toe of the right foot he also has just a small area on the left foot upper leg and a small area on the left lower leg but overall he is doing quite well in my opinion. He is supposed to be getting his wraps shortly in fact tomorrow and then subsequently is seeing the lymphedema clinic next Wednesday on the 21st. Of note he is also leaving on the 25th to go on vacation for a week to the beach. For that reason and since there is some uncertainty about what there can be doing at lymphedema clinic next Wednesday I am get a make an appointment for next Friday here for [place] to see what we need to do for him prior to him leaving for vacation. 10/23; patient arrives in considerable pain predominantly in the upper posterior calf just distal to the popliteal fossa also in the wound anteriorly above the major wound. This is probably cellulitis and he has had this recurrently in the past. He has no open wound on the right side and he has had an Radio broadcast assistant in that area. Finally I note that he has an area on the left posterior calf which by enlarge is mostly epithelialized. This protrudes beyond the borders of the surrounding skin in the setting of dry scaly skin and lymphedema. The patient is leaving for [place] on Sunday. Per  his longstanding pattern, he will not take his compression pumps with him predominantly out of fear that they will be stolen. He therefore asked that we put a Unna boot back on the right leg. He will also contact the wound care center in [facility] to see if they can  change his dressing in the mid week. 11/3; patient returned from his vacation to [facility]. He was seen on 1 occasion at their wound care center. They did a 2 layer compression system as they did not have our 4-layer wrap. I am not completely certain what they put on the wounds. They did not change the Unna boot on the right. The patient is also seeing a lymphedema specialist physical therapist in [place]. It appears that he has some compression sleeve for his thighs which indeed look quite a bit better than I am used to seeing. He pumps over these with his external compression pumps. 11/10; the patient has a new wound on the right medial thigh otherwise there is no open areas on the right. He has an area on the left leg posteriorly anteriorly and medially and an area over the left second toe. We have been using silver alginate. He thinks the injury on his thigh is secondary to friction from the compression sleeve he has. 11/17; the patient has a new wound on the right medial thigh last week. He thinks this is because he did not have a underlying stocking for his thigh juxta lite apparatus. He now has this. The area is fairly large and somewhat angry but I do not think he has underlying cellulitis. He has a intact blister on the right anterior tibial area. Small wound on the right great toe dorsally Small area on the medial left calf. 11/30; the patient does not have any open areas on his right leg and we did not take his juxta lite stocking off. However he states that on Friday his compression wrap fell down lodging around his upper mid calf area. As usual this creates a lot of problems for him. He called urgently today to be seen for a nurse visit however the nurse visit turned into a provider visit because of extreme erythema and pain in the left anterior tibia extending laterally and posteriorly. The area that is problematic is extensive 10/06/2019 upon evaluation today patient actually  appears to be doing poorly in regard to his left lower extremity. He Dr. Leanord Hawking did place him on doxycycline this past Monday apparently due to the fact that he was doing much worse in regard to this left leg. Fortunately the doxycycline does seem to be helping. Unfortunately we are still having a very difficult time getting his edema under any type of control in order to anticipate discharge at some point. The only way were really able to control his lymphedema really is with compression wraps and that has only even seemingly temporary. He has been seeing a lymphedema clinic they are trying to help in this regard but still this has been somewhat frustrating in general for the patient. 10/13/19 on evaluation today patient appears to be doing excellent with regard to his right lower extremity as far as the wounds are concerned. His swelling is still quite extensive unfortunately. He is still having a lot of drainage from the thigh areas bilaterally which is unfortunate. He's been going to lymphedema clinic but again he still really does not have this edema under control as far as his lower extremities are concern. With regard  to his left lower extremity this seems to be improving and I do believe the doxycycline has been of benefit for him. He is about to complete the doxycycline. 10/20/2019 on evaluation today patient appears to be doing poorly in regard to his bilateral lower extremities. More in the right thigh he has a lot of irritation at this site unfortunately. In regard to the left lower extremity the wrap was not quite as high it appears and does seem to have caused him some trouble as well. Fortunately there is no evidence of systemic infection though he does have some blue-green drainage which has me concerned for the possibility of Pseudomonas. He tells me he is previously taking Cipro without complications and he really does not care for Levaquin however due to some of the side effects he  has. He is not allergic to any medications specifically antibiotics that were aware of. 10/27/2019 on evaluation today patient actually does appear to be for the most part doing better when compared to last week's evaluation. With that being said he still has multiple open wounds over the bilateral lower extremities. He actually forgot to start taking the Cipro and states that he still has the whole bottle. He does have several new blisters on left lower extremity today I think I would recommend he go ahead and take the Cipro based on what I am seeing at this point. 12/30-Patient comes at 1 week visit, 4 layer compression wraps on the left and Unna boot on the right, primary dressing Xtrasorb and silver alginate. Patient is taking his Cipro and has a few more days left probably 5-6, and the legs are doing better. He states he is using his compressions devices which I believe he has 11/10/2019 on evaluation today patient actually appears to be much better than last time I saw him 2 weeks ago. His wounds are significantly improved and overall I am very pleased in this regard. Fortunately there is no signs of active infection at this time. He is just a couple of days away from completing Cipro. Overall his edema is much better he has been using his lymphedema pumps which I think is also helping at this point. 11/17/2019 on evaluation today patient appears to be doing excellent in regard to his wounds in general. His legs are swollen but not nearly as much as they have been in the past. Fortunately he is tolerating the compression wraps without complication. No fevers, chills, nausea, vomiting, or diarrhea. He does have some erythema however in the distal portion of his right lower extremity specifically around the forefoot and toes there is a little bit of warmth here as well. 11/24/2019 on evaluation today patient appears to be doing well with regard to his right lower extremity I really do not see any open  wounds at this point. His left lower extremity does have several open areas and his right medial thigh also is open. Other than this however overall the patient seems to be making good progress and I am very pleased at this point. 12/01/2019 on evaluation today patient appears to be doing poorly at this point in regard to his left lower extremity has several new blisters despite the fact that we have him in compression wraps. In fact he had a 4-layer compression wrap, his upper thigh wrapped from lymphedema clinic, and a juxta light over top of the 4 layer compression wrap the lymphedema clinic applied and despite all this he still develop blisters underneath. Obviously this does  have me concerned about the fact that unfortunately despite what we are doing to try to get wounds healed he continues to have new areas arise I do not think he is ever good to be at the point where he can realistically just use wraps at home to keep things under control. Typically when we heal him it takes about 1-2 days before he is back in the clinic with severe breakdown and blistering of his lower extremities bilaterally. This is happened numerous times in the past. Unfortunately I think that we may need some help as far as overall fluid overload to kind of limit what we are seeing and get things under better control. 12/08/2019 on evaluation today patient presents for follow-up concerning his ongoing bilateral lower extremity edema. Unfortunately he is still having quite a bit of swelling the compression wraps are controlling this to some degree but he did see Dr. Debara Pickett his cardiologist I do have that available for review today as far as the appointment was concerned that was on 12/06/2019. Obviously that she has been 2 days ago. The patient states that he is only been taking the Lasix 80 mg 1 time a day he had told me previously he was taking this twice a day. Nonetheless Dr. Debara Pickett recommended this be up to 80 mg 2 times a  day for the patient as he did appear to be fluid overloaded. With that being said the patient states he did this yesterday and he was unable to go anywhere or do anything due to the fact that he was constantly having to urinate. Nonetheless I think that this is still good to be something that is important for him as far as trying to get his edema under control at all things that he is going to be able to just expect his wounds to get under control and things to be better without going through at least a period of time where he is trying to stabilize his fluid management in general and I think increasing the Lasix is likely the first step here. It was also mentioned the possibility that the patient may require metolazone. With that being said he wanted to have the patient take Lasix twice a day first and then reevaluating 2 months to see where things stand. 12/15/2019 upon evaluation today patient appears to be doing regard to his legs although his toes are showing some signs of weeping especially on the left at this point to some degree on the right. There does not appear to be any signs of active infection and overall I do feel like the compression wraps are doing well for him but he has not been able to take the Lasix at home and the increased dose that Dr. Debara Pickett recommended. He tells me that just not go to be feasible for him. Nonetheless I think in this case he should probably send a message to Dr. Debara Pickett in order to discuss options from the standpoint of possible admission to get the fluid off or otherwise going forward. 12/22/2019 upon evaluation today patient appears to be doing fairly well with regard to his lower extremities at this point. In fact he would be doing excellent if it was not for the fact that his right anterior thigh apparently had an allergic reaction to adhesive tape that he used. The wound itself that we have been monitoring actually appears to be healed. There is a lot of  irritation at this point. 12/29/2019 upon evaluation today patient appears to  be doing well in regard to his lower extremities. His left medial thigh is open and somewhat draining today but this is the only region that is open the right has done much better with the treatment utilizing the steroid cream that I prescribed for him last week. Overall I am pleased in that regard. Fortunately there is no signs of active infection at this time. No fevers, chills, nausea, vomiting, or diarrhea. 01/05/2020 upon evaluation today patient appears to be doing more poorly in regard to his right lower extremity at this point upon evaluation today. Unfortunately he continues to have issues in this regard and I think the biggest issue is controlling his edema. This obviously is not very well controlled at this point is been recommended that he use the Lasix twice a day but he has not been able to do that. Unfortunately I think this is leading to an issue where honestly he is not really able to effectively control his edema and therefore the wounds really are not doing significantly better. I do not think that he is going to be able to keep things under good control unless he is able to control his edema much better. I discussed this again in great detail with him today. 01/12/2020 good news is patient actually appears to be doing quite well today at this point. He does have an appointment with lymphedema clinic tomorrow. His legs appear healed and the toe on the left is almost completely healed. In general I am very pleased with how things stand at this point. 01/19/2020 upon evaluation today patient appears to actually be doing well in regard to his lower extremities there is nothing open at this point. Fortunately he has done extremely well more recently. Has been seeing lymphedema clinic as well. With that being said he has Velcro wraps for his lower legs as well as his upper legs. The only wound really is on his toe  which is the right great toe and this is barely anything even there. With all that being said I think it is good to be appropriate today to go ahead and switch him over to the Velcro compression wraps. 01/26/2020 upon evaluation today patient appears to be doing worse with regard to his lower extremities after last week switch him to Velcro compression wraps. Unfortunately he lasted less than 24 hours he did not have the sock portion of his Velcro wrap on the left leg and subsequently developed a blister underneath the Velcro portion. Obviously this is not good and not what we were looking for at this point. He states the lymphedema clinic did tell him to wear the wrap for 23 hours and take him off for 1 I am okay with that plan but again right now we got a get things back under control again he may have some cellulitis noted as well. 02/02/2020 upon evaluation today patient unfortunately appears to have several areas of blistering on his bilateral lower extremities today mainly on the feet. His legs do seem to be doing somewhat better which is good news. Fortunately there is no evidence of active infection at this time. No fevers, chills, nausea, vomiting, or diarrhea. 02/16/2020 upon evaluation today patient appears to be doing well at this time with regard to his legs. He has a couple weeping areas on his toes but for the most part everything is doing better and does appear to be sealed up on his legs which is excellent news. We can continue with wrapping him  at this point as he had every time we discontinue the wraps he just breaks out with new wounds. There is really no point in is going forward with this at this point. 03/08/2020 upon evaluation today patient actually appears to be doing quite well with regard to his lower extremity ulcers. He has just a very superficial and really almost nonexistent blister on the left lower extremity he has in general done very well with the compression wraps. With  that being said I do not see any signs of infection at this time which is good news. 03/29/2020 upon evaluation today patient appears to be doing well with regard to his wounds currently except for where he had several new areas that opened up due to some of the wrap slipping and causing him trouble. He states he did not realize they had slipped. Nonetheless he has a 1 area on the right and 3 new areas on the left. Fortunately there is no signs of active infection at this time which is great news. 04/05/2020 upon evaluation today patient actually appears to be doing quite well in general in regard to his legs currently. Fortunately there is no signs of active infection at this time. No fevers, chills, nausea, vomiting, or diarrhea. He tells me next week that he will actually be seen in the lymphedema clinic on Thursday at 10 AM I see him on Wednesday next week. 04/12/2020 upon evaluation today patient appears to be doing very well with regard to his lower extremities bilaterally. In fact he does not appear to have any open wounds at this point which is good news. Fortunately there is no signs of active infection at this time. No fevers, chills, nausea, vomiting, or diarrhea. 04/19/2020 upon evaluation today patient appears to be doing well with regard to his wounds currently on the bilateral lower extremities. There does not appear to be any signs of active infection at this time. Fortunately there is no evidence of systemic infection and overall very pleased at this point. Nonetheless after I held him out last week he literally had blisters the next morning already which swelled up with him being right back here in the clinic. Overall I think that he is just not can be able to be discharged with his legs the way they are he is much to volume overloaded as far as fluid is concerned and that was discussed with him today of also discussed this but should try the clinic nurse manager as well as Dr.  Leanord Hawking. 04/26/2020 upon evaluation today patient appears to be doing better with regard to his wounds currently. He is making some progress and overall swelling is under good control with the compression wraps. Fortunately there is no evidence of active infection at this time. 05/10/2020 on evaluation today patient appears to be doing overall well in regard to his lower extremities bilaterally. He is Tolerating the compression wraps without complication and with what we are seeing currently I feel like that he is making excellent progress. There is no signs of active infection at this time. 05/24/2020 upon evaluation today patient appears to be doing well in regard to his legs. The swelling is actually quite a bit down compared to where it has been in the past. Fortunately there is no sign of active infection at this time which is also good news. With that being said he does have several wounds on his toes that have opened up at this point. 05/31/2020 upon evaluation today patient appears to  be doing well with regard to his legs bilaterally where he really has no significant fluid buildup at this point overall he seems to be doing quite well. Very pleased in this regard. With regard to his toes these also seem to be drying up which is excellent. We have continue to wrap him as every time we tried as a transition to the juxta light wraps things just do not seem to get any better. 06/07/2020 upon evaluation today patient appears to be doing well with regard to his right leg at this point. Unfortunately left leg has a lot of blistering he tells me the wrap started to slide down on him when he tried to put his other Velcro wrap over top of it to help keep things in order but nonetheless still had some issues. 06/14/2020 on evaluation today patient appears to be doing well with regard to his lower extremity ulcers and foot ulcers at this point. I feel like everything is actually showing signs of improvement which  is great news overall there is no signs of active infection at this time. No fevers, chills, nausea, vomiting, or diarrhea. 06/21/2020 on evaluation today patient actually appears to be doing okay in regard to his wounds in general. With that being said the biggest issue I see is on his right foot in particular the first and second toe seem to be doing a little worse due to the fact this is staying very wet. I think he is probably getting need to change out his dressings a couple times in between each week when we see him in regard to his toes in order to keep this drier based on the location and how this is proceeding. 06/28/2020 on evaluation today patient appears to be doing a little bit more poorly overall in regard to the appearance of the skin I am actually somewhat concerned about the possibility of him having a little bit of an infection here. We discussed the course of potentially giving him a doxycycline prescription which he is taken previously with good result. With that being said I do believe that this is potentially mild and at this point easily fixed. I just do not want anything to get any worse. 07/12/2020 upon evaluation today patient actually appears to be making some progress with regard to his legs which is great news there does not appear to be any evidence of active infection. Overall very pleased with where things stand. 07/26/2020 upon evaluation today patient appears to be doing well with regard to his leg ulcers and toe ulcers at this point. He has been tolerating the compression wraps without complication overall very pleased in this regard. Electronic Signature(s) Signed: 07/26/2020 11:23:07 AM By: Lenda Kelp PA-C Entered By: Lenda Kelp on 07/26/2020 11:23:06 -------------------------------------------------------------------------------- Physical Exam Details Patient Name: Date of Service: [name], [name] 07/26/2020 10:15 A M Medical Record Number:  [id number] Patient Account Number: [id number] Date of Birth/Sex: Treating RN: 19-May-1951 (69 y.o. [name] Primary Care Provider: Marney Setting, [name] Other Clinician: Referring Provider: Treating Provider/Extender: Lenda Kelp [facility] [name], [name] in Treatment: 235 Constitutional Obese and well-hydrated in no acute distress. Respiratory normal breathing without difficulty. Psychiatric this patient is able to make decisions and demonstrates good insight into disease process. Alert and Oriented x 3. pleasant and cooperative. Notes His wound bed currently showed signs of good granulation and epithelization I feel like he is actually making progress things are closing up and I do not see  much in the way of anything open at this time which is great news. Overall I think that he is headed in the right direction and the wraps are keeping things under control. Electronic Signature(s) Signed: 07/26/2020 11:23:32 AM By: Lenda Kelp PA-C Entered By: Lenda Kelp on 07/26/2020 11:23:31 -------------------------------------------------------------------------------- Physician Orders Details Patient Name: Date of Service: [name], [name] 07/26/2020 10:15 A M Medical Record Number: [id number] Patient Account Number: [id number] Date of Birth/Sex: Treating RN: [birth year] (69 y.o. 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 - with Leonard Schwartz Return A Nurse Visit: - 1 week Skin Barriers/Peri-Wound Care Moisturizing lotion - both legs and feet and toes Wound Cleansing May shower with protection. Primary Wound Dressing Wound #183 Left T Second oe lginate with Silver - to any blisters or weeping areas Calcium A Wound #184 Left,Anterior Lower Leg lginate with Silver - to any blisters or weeping areas Calcium A Secondary Dressing Wound #183 Left T Second oe Kerlix/Rolled Gauze - secure with coban Dry Gauze Wound #184 Left,Anterior Lower Leg Dry Gauze Edema Control 4 layer compression: Left lower extremity Unna Boot to Right Lower Extremity Avoid standing for long periods of time Elevate legs to the level of the heart or above for 30 minutes daily and/or when sitting, a frequency of: - throughout the day Exercise regularly Electronic Signature(s) Signed: 07/26/2020 5:11:27 PM By: Zenaida Deed RN, BSN Signed: 07/26/2020 5:16:27 PM By: Lenda Kelp PA-C Entered By: Zenaida Deed on 07/26/2020 11:21:50 -------------------------------------------------------------------------------- Problem List Details Patient Name: Date of Service: [name], [name] 07/26/2020 10:15 A M Medical Record Number: [id number] Patient Account Number: [id number] Date of Birth/Sex: Treating RN: 02/22/1951 (69 y.o. [name] Primary Care Provider: Marney Setting, [name] Other Clinician: Referring Provider: Treating Provider/Extender: Suzy Bouchard, [name] in Treatment: 235 Active Problems ICD-10 Encounter Code Description Active Date MDM Diagnosis 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 skin 06/30/2018 No Yes L97.221 Non-pressure chronic ulcer of left calf limited to breakdown of skin 09/30/2016 No Yes I87.333  Chronic venous hypertension (idiopathic) with ulcer and inflammation of 01/22/2016 No Yes bilateral lower extremity I89.0 Lymphedema, not elsewhere classified 01/22/2016 No Yes E11.622 Type 2 diabetes mellitus with other skin ulcer 01/22/2016 No Yes 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, unspecified 01/22/2016 No Yes L03.116 Cellulitis of left lower limb 04/01/2017 No Yes Inactive Problems ICD-10 Code Description Active Date Inactive Date 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 skin 06/30/2017 06/30/2017 L97.521 Non-pressure chronic ulcer of other 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 of skin 04/27/2018 04/27/2018 L03.115 Cellulitis of right lower limb 12/22/2017 12/22/2017 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 06/30/2018 06/30/2018 L97.511 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 of skin 06/30/2018 06/30/2018 Resolved Problems Electronic Signature(s) Signed: 07/26/2020 10:10:51 AM By:  Linwood Dibbles, Bradi Arbuthnot PA-C Entered By: Lenda Kelp on 07/26/2020 10:10:48 -------------------------------------------------------------------------------- Progress Note Details Patient Name: Date of Service: [name], [name] 07/26/2020 10:15 A M Medical Record Number: [id number] Patient Account Number: [id number] Date of Birth/Sex: Treating RN: 05-20-1951 (69 y.o. [name] Primary Care Provider: Marney Setting, [name] Other Clinician: Referring Provider: Treating Provider/Extender: Lenda Kelp [facility] [name], [name] in Treatment: [phone] Subjective Chief Complaint Information obtained from Patient patient is here for evaluation venous/lymphedema weeping History of Present Illness (HPI) Referred by PCP for consultation. Patient has long standing history of BLE venous stasis, no prior ulcerations. At beginning of month, developed cellulitis and weeping. Received IM Rocephin followed by Keflex  and resolved. Wears compression stocking, appr 6 months old. Not sure strength. No present drainage. 01/22/16 this is a patient who is a type II diabetic on insulin. 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 He tells me he religiously wears pressure stockings of uncertain strength. He was here with weeping edema about 8 months ago but did not have an open wound. Roughly a month ago he had a reopening on his bilateral legs. He is been using bandages and Neosporin. He does not complain of pain. 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 Last BUN/creatinine I have is from 11/20/15 at 13 and 1.0 respectively 01/29/16; patient arrives today having tolerated the Profore wrap. He brought in his stockings and these are 18 mmHg stockings he bought from [employer]. The compression here is likely inadequate. He does not complain of pain or excessive drainage she has no systemic symptoms. The wound on the right looks improved as does the one on the left although one on the left is more substantial with still tissue at risk below the actual wound area on the bilateral posterior calf 02/05/16; patient arrives with poor edema control. He states that we did put a 4 layer compression on it last week. No weight appear 5 this. 02/12/16; the area on the posterior right Has healed. The left Has a substantial wound that has necrotic surface eschar that requires a debridement with a curette. 02/16/16;the patient called or a Nurse visit secondary to increased swelling. He had been in earlier in the week with his right leg healed. He was transitioned to is on pressure stocking on the right leg with the only open wound on the left, a substantial area on the left posterior calf. Note he has a history of severe lower extremity edema, he has a history of chronic atrial fibrillation but not heart  failure per my notes but I'll need to research this. He is not complaining of chest pain shortness of breath or orthopnea. The intake nurse noted blisters on the previously closed right leg 02/19/16; this is the patient's regular visit day. I see him on Friday with escalating edema new wounds on the right leg and clear signs of at least right ventricular heart failure. I increased his Lasix to 40 twice a day. He is returning currently in follow-up. States he is noticed a decrease in that the edema 02/26/16 patient's legs have much less edema. There is nothing really open on the right leg. The left leg has improved condition of the large superficial wound on the posterior left leg 03/04/16; edema control is very much better. The patient's right leg wounds have healed. On the left leg he  continues to have severe venous inflammation on the posterior aspect of the left leg. There is no tenderness and I don't think any of this is cellulitis. 03/11/16; patient's right leg is married healed and he is in his own stocking. The patient's left leg has deteriorated somewhat. There is a lot of erythema around the wound on the posterior left leg. There is also a significant rim of erythema posteriorly just above where the wrap would've ended there is a new wound in this location and a lot of tenderness. Can't rule out cellulitis in this area. 03/15/16; patient's right leg remains healed and he is in his own stocking. The patient's left leg is much better than last review. His major wound on the posterior aspect of his left Is almost fully epithelialized. He has 3 small injuries from the wraps. Really. Erythema seems a lot better on antibiotics 03/18/16; right leg remains healed and he is in his own stocking. The patient's left leg is much better. The area on the posterior aspect of the left calf is fully epithelialized. His 3 small injuries which were wrap injuries on the left are improved only one seems still open his  erythema has resolved 03/25/16; patient's right leg remains healed and he is in his own stocking. There is no open area today on the left leg posterior leg is completely closed up. His wrap injuries at the superior aspect of his leg are also resolved. He looks as though he has some irritation on the dorsal ankle but this is fully epithelialized without evidence of infection. 03/28/16; we discharged this patient on Monday. Transitioned him into his own stocking. There were problems almost immediately with uncontrolled swelling weeping edema multiple some of which have opened. He does not feel systemically unwell in particular no chest pain no shortness of breath and he does not feel 04/08/16; the edema is under better control with the Profore light wrap but he still has pitting edema. There is one large wound anteriorly 2 on the medial aspect of his left leg and 3 small areas on the superior posterior calf. Drainage is not excessive he is tolerating a Profore light well 04/15/16; put a Profore wrap on him last week. This is controlled is edema however he had a lot of pain on his left anterior foot most of his wounds are healed 04/22/16 once again the patient has denuded areas on the left anterior foot which he states are because his wrap slips up word. He saw his primary physician today is on Lasix 40 twice a day and states that he his weight is down 20 pounds over the last 3 months. 04/29/16: Much improved. left anterior foot much improved. He is now on Lasix 80 mg per day. Much improved edema control 05/06/16; I was hoping to be able to discharge him today however once again he has blisters at a low level of where the compression was placed last week mostly on his left lateral but also his left medial leg and a small area on the anterior part of the left foot. 05/09/16; apparently the patient went home after his appointment on 7/4 later in the evening developing pain in his upper medial thigh together with  subjective fever and chills although his temperature was not taken. The pain was so intense he felt he would probably have to call 911. However he then remembered that he had leftover doxycycline from a previous round of antibiotics and took these. By the next morning he felt  a lot better. He called and spoke to one of our nurses and I approved doxycycline over the phone thinking that this was in relation to the wounds we had previously seen although they were definitely were not. The patient feels a lot better old fever no chills he is still working. Blood sugars are reasonably controlled 05/13/16; patient is back in for review of his cellulitis on his anterior medial upper thigh. He is taking doxycycline this is a lot better. Culture I did of the nodular area on the dorsal aspect of his foot grew MRSA this also looks a lot better. 05/20/16; the patient is cellulitis on the medial upper thigh has resolved. All of his wound areas including the left anterior foot, areas on the medial aspect of the left calf and the lateral aspect of the calf at all resolved. He has a new blister on the left dorsal foot at the level of the fourth toe this was excised. No evidence of infection 05/27/16; patient continues to complain weeping edema. He has new blisterlike wounds on the left anterior lateral and posterior lateral calf at the top of his wrap levels. The area on his left anterior foot appears better. He is not complaining of fever, pain or pruritus in his feet. 05/30/16; the patient's blisters on his left anterior leg posterior calf all look improved. He did not increase the Lasix 100 mg as I suggested because he was going to run out of his 40 mg tablets. He is still having weeping edema of his toes 06/03/16; I renewed his Lasix at 80 mg once a day as he was about to run out when I last saw him. He is on 80 mg of Lasix now. I have asked him to cut down on the excessive amount of water he was drinking and asked him  to drink according to his thirst mechanisms 06/12/2016 -- was seen 2 days ago and was supposed to wear his compression stockings at home but he is developed lymphedema and superficial blisters on the left lower extremity and hence came in for a review 06/24/16; the remaining wound is on his left anterior leg. He still has edema coming from between his toes. There is lymphedema here however his edema is generally better than when I last saw this. He has a history of atrial fibrillation but does not have a known history of congestive heart failure nevertheless I think he probably has this at least on a diastolic basis. 07/01/16 I reviewed his echocardiogram from January 2017. This was essentially normal. He did not have LVH, EF of 55-60%. His right ventricular function was normal although he did have trivial tricuspid and pulmonic regurgitation. This is not audible on exam however. I increased his Lasix to do massive edema in his legs well above his knees I think in early July. He was also drinking an excessive amount of water at the time. 07/15/16; missed his appointment last week because of the Labor Day holiday on Monday. He could not get another appointment later in the week. Started to feel the wrap digging in superiorly so we remove the top half and the bottom half of his wrap. He has extensive erythema and blistering superiorly in the left leg. Very tender. Very swollen. Edema in his foot with leaking edema fluid. He has not been systemically unwell 07/22/16; the area on the left leg laterally required some debridement. The medial wounds look more stable. His wrap injury wounds appear to have healed. Edema and  his foot is better, weeping edema is also better. He tells me he is meeting with the supplier of the external compression pumps at work 08/05/16; the patient was on vacation last week in [facility]. His wrap is been on for an extended period of time. Also over the weekend he developed  an extensive area of tender erythema across his anterior medial thigh. He took to doxycycline yesterday that he had leftover from a previous prescription. The patient complains of weeping edema coming out of his toes 08/08/16; I saw this patient on 10/2. He was tender across his anterior thigh. I put him on doxycycline. He returns today in follow-up. He does not have any open wounds on his lower leg, he still has edema weeping into his toes. 08/12/16; patient was seen back urgently today to follow-up for his extensive left thigh cellulitis/erysipelas. He comes back with a lot less swelling and erythema pain is much better. I believe I gave him Augmentin and Cipro. His wrap was cut down as he stated a roll down his legs. He developed blistering above the level of the wrap that remained. He has 2 open blisters and 1 intact. 08/19/16; patient is been doing his primary doctor who is increased his Lasix from 40-80 once a day or 80 already has less edema. Cellulitis has remained improved in the left thigh. 2 open areas on the posterior left calf 08/26/16; he returns today having new open blisters on the anterior part of his left leg. He has his compression pumps but is not yet been shown how to use some vital representative from the supplier. 09/02/16 patient returns today with no open wounds on the left leg. Some maceration in his plantar toes 09/10/2016 -- Dr. Leanord Hawking had recently discharged him on 09/02/2016 and he has come right back with redness swelling and some open ulcers on his left lower extremity. He says this was caused by trying to apply his compression stockings and he's been unable to use this and has not been able to use his lymphedema pumps. He had some doxycycline leftover and he has started on this a few days ago. 09/16/16; there are no open wounds on his leg on the left and no evidence of cellulitis. He does continue to have probable lymphedema of his toes, drainage and maceration between  his toes. He does not complain of symptoms here. I am not clear use using his external compression pumps. 09/23/16; I have not seen this patient in 2 weeks. He canceled his appointment 10 days ago as he was going on vacation. He tells me that on Monday he noticed a large area on his posterior left leg which is been draining copiously and is reopened into a large wound. He is been using ABDs and the external part of his juxtalite, according to our nurse this was not on properly. 10/07/16; Still a substantial area on the posterior left leg. Using silver alginate 10/14/16; in general better although there is still open area which looks healthy. Still using silver alginate. He reminds me that this happen before he left for [name]. T oday while he was showering in the morning. He had been using his juxtalite's 10/21/16; the area on his posterior left leg is fully epithelialized. However he arrives today with a large area of tender erythema in his medial and posterior left thigh just above the knee. I have marked the area. Once again he is reluctant to consider hospitalization. I treated him with oral antibiotics in  the past for a similar situation with resolution I think with doxycycline however this area it seems more extensive to me. He is not complaining of fever but does have chills and says states he is thirsty. His blood sugar today was in the 140s at home 10/25/16 the area on his posterior left leg is fully epithelialized although there is still some weeping edema. The large area of tenderness and erythema in his medial and posterior left thigh is a lot less tender although there is still a lot of swelling in this thigh. He states he feels a lot better. He is on doxycycline and Augmentin that I started last week. This will continued until Tuesday, December 26. I have ordered a duplex ultrasound of the left thigh rule out DVT whether there is an abscess something that would need to be drained I  would also like to know. 11/01/16; he still has weeping edema from a not fully epithelialized area on his left posterior calf. Most of the rest of this looks a lot better. He has completed his antibiotics. His thigh is a lot better. Duplex ultrasound did not show a DVT in the thigh 11/08/16; he comes in today with more Denuded surface epithelium from the posterior aspect of his calf. There is no real evidence of cellulitis. The superior aspect of his wrap appears to have put quite an indentation in his leg just below the knee and this may have contributed. He does not complain of pain or fever. We have been using silver alginate as the primary dressing. The area of cellulitis in the right thigh has totally resolved. He has been using his compression stockings once a week 11/15/16; the patient arrives today with more loss of epithelium from the posterior aspect of his left calf. He now has a fairly substantial wound in this area. The reason behind this deterioration isn't exactly clear although his edema is not well controlled. He states he feels he is generally more swollen systemically. He is not complaining of chest pain shortness of breath fever. T me he has an appointment with his primary physician in early February. He is on 80 mg of oral ells Lasix a day. He claims compliance with the external compression pumps. He is not having any pain in his legs similar to what he has with his recurrent cellulitis 11/22/16; the patient arrives a follow-up of his large area on his left lateral calf. This looks somewhat better today. He came in earlier in the week for a dressing change since I saw him a week ago. He is not complaining of any pain no shortness of breath no chest pain 11/28/16; the patient arrives for follow-up of his large area on the left lateral calf this does not look better. In fact it is larger weeping edema. The surface of the wound does not look too bad. We have been using silver alginate  although I'm not certain that this is a dressing issue. 12/05/16; again the patient follows up for a large wound on the left lateral and left posterior calf this does not look better. There continues to be weeping edema necrotic surface tissue. More worrisome than this once again there is erythema below the wound involving the distal Achilles and heel suggestive of cellulitis. He is on his feet working most of the day of this is not going well. We are changing his dressing twice a week to facilitate the drainage. 12/12/16; not much change in the overall dimensions of the large  area on the left posterior calf. This is very inflamed looking. I gave him an. Doxycycline last week does not really seem to have helped. He found the wrap very painful indeed it seems to of dog into his legs superiorly and perhaps around the heel. He came in early today because the drainage had soaked through his dressings. 12/19/16- patient arrives for follow-up evaluation of his left lower extremity ulcers. He states that he is using his lymphedema pumps once daily when there is "no drainage". He admits to not using his lipedema pumps while under current treatment. His blood sugars have been consistently between 150-200. 12/26/16; the patient is not using his compression pumps at home because of the wetness on his feet. I've advised him that I think it's important for him to use this daily. He finds his feet too wet, he can put a plastic bag over his legs while he is in the pumps. Otherwise I think will be in a vicious circle. We are using silver alginate to the major area on his left posterior calf 01/02/17; the patient's posterior left leg has further of all into 3 open wounds. All of them covered with a necrotic surface. He claims to be using his compression pumps once a day. His edema control is marginal. Continue with silver alginate 01/10/17; the patient's left posterior leg actually looks somewhat better. There is less edema,  less erythema. Still has 3 open areas covered with a necrotic surface requiring debridement. He claims to be using his compression pumps once a day his edema control is better 01/17/17; the patient's left posterior calf look better last week when I saw him and his wrap was changed 2 days ago. He has noted increasing pain in the left heel and arrives today with much larger wounds extensive erythema extending down into the entire heel area especially tender medially. He is not systemically unwell CBGs have been controlled no fever. Our intake nurse showed me limegreen drainage on his AVD pads. 01/24/17; his usual this patient responds nicely to antibiotics last week giving him Levaquin for presumed Pseudomonas. The whole entire posterior part of his leg is much better much less inflamed and in the case of his Achilles heel area much less tender. He has also had some epithelialization posteriorly there are still open areas here and still draining but overall considerably better 01/31/17- He has continue to tolerate the compression wraps. he states that he continues to use the lymphedema pumps daily, and can increase to twice daily on the weekends. He is voicing no complaints or concerns regarding his LLE ulcers 02/07/17-he is here for follow-up evaluation. He states that he noted some erythema to the left medial and anterior thigh, which he states is new as of yesterday. He is concerned about recurrent cellulitis. He states his blood sugars have been slightly elevated, this morning in the 180s 02/14/17; he is here for follow-up evaluation. When he was last here there was erythema superiorly from his posterior wound in his anterior thigh. He was prescribed Levaquin however a culture of the wound surface grew MRSA over the phone I changed him to doxycycline on Monday and things seem to be a lot better. 02/24/17; patient missed his appointment on Friday therefore we changed his nurse visit into a physician visit  today. Still using silver alginate on the large area of the posterior left thigh. He isn't new area on the dorsal left second toe 03/03/17; actually better today although he admits he has not  used his external compression pumps in the last 2 days or so because of work responsibilities over the weekend. 03/10/17; continued improvement. External compression pumps once a day almost all of his wounds have closed on the posterior left calf. Better edema control 03/17/17; in general improved. He still has 3 small open areas on the lateral aspect of his left leg however most of the area on the posterior part of his leg is epithelialized. He has better edema control. He has an ABD pad under his stocking on the right anterior lower leg although he did not let us look at that today. 03/24/17; patient arrives back in clinic today with no open areas however there are areas on the posterior left calf and anterior left calf that are less than 100% epithelialized. His edema is well controlled in the left lower leg. There is some pitting edema probably lymphedema in the left upper thigh. He uses compression pumps at home once per day. I tried to get him to do this twice a day although he is very reticent. 04/01/2017 -- for the last 2 days he's had significant redness, tenderness and weeping and came in for an urgent visit today. 04/07/17; patient still has 6 more days of doxycycline. He was seen by Dr. Meyer Russel last Wednesday for cellulitis involving the posterior aspect, lateral aspect of his Involving his heel. For the most part he is better there is less erythema and less weeping. He has been on his feet for 12 hours o2 over the weekend. Using his compression pumps once a day 04/14/17 arrives today with continued improvement. Only one area on the posterior left calf that is not fully epithelialized. He has intense bilateral venous inflammation associated with his chronic venous insufficiency disease and secondary  lymphedema. We have been using silver alginate to the left posterior calf wound In passing he tells [place] today that the right leg but we have not seen in quite some time has an open area on it but he doesn't want [place] to look at this today states he will show this to [place] next week. 04/21/17; there is no open area on his left leg although he still reports some weeping edema. He showed [place] his right leg today which is the first time we've seen this leg in a long time. He has a large area of open wound on the right leg anteriorly healthy granulation. Quite a bit of swelling in the right leg and some degree of venous inflammation. He told us about the right leg in passing last week but states that deterioration in the right leg really only happened over the weekend 04/28/17; there is no open area on the left leg although there is an irritated part on the posterior which is like a wrap injury. The wound on the right leg which was new from last week at least to [place] is a lot better. 05/05/17; still no open area on the left leg. Patient is using his new compression stocking which seems to be doing a good job of controlling the edema. He states he is using his compression pumps once per day. The right leg still has an open wound although it is better in terms of surface area. Required debridement. A lot of pain in the posterior right Achilles marked tenderness. Usually this type of presentation this patient gives concern for an active cellulitis 05/12/17; patient arrives today with his major wound from last week on the right lateral leg somewhat better. Still requiring debridement.  He was using his compression stocking on the left leg however that is reopened with superficial wounds anteriorly he did not have an open wound on this leg previously. He is still using his juxta light's once daily at night. He cannot find the time to do this in the morning as he has to be at work by 7 AM 05/19/17; right lateral leg wound looks  improved. No debridement required. The concerning area is on the left posterior leg which appears to almost have a subcutaneous hemorrhagic component to it. We've been using silver alginate to all the wounds 05/26/17; the right lateral leg wound continues to look improved. However the area on the left posterior calf is a tightly adherent surface. [place] using silver alginate. Because of the weeping edema in his legs there is very little good alternatives. 06/02/17; the patient left here last week looking quite good. Major wound on the left posterior calf and a small one on the right lateral calf. Both of these look satisfactory. He tells me that by Wednesday he had noted increased pain in the left leg and drainage. He called on Thursday and Friday to get an appointment here but we were blocked. He did not go to urgent care or his primary physician. He thinks he had a fever on Thursday but did not actually take his temperature. He has not been using his compression pumps on the left leg because of pain. I advised him to go to the emergency room today for IV antibiotics for stents of left leg cellulitis but he has refused I have asked him to take 2 days off work to keep his leg elevated and he has refused this as well. In view of this I'm going to call him and Augmentin and doxycycline. 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 However after a detailed discussion regarding the risk benefits and alternatives he refuses to get admitted to the [name]. With no other recourse I will continue him on oral antibiotics as before and hopefully he'll have his infectious disease consultation this week. 06/16/2017 -- the patient was seen today by the nurse practitioner at  infectious disease Ms. Dixon. 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 He has also been advise Lotrimin cream locally. she also had wise IV antibiotics for his condition if it flares up 06/23/17; patient arrives today with drainage bilaterally although the remaining wound on the left posterior calf after cleaning up today "highlighter yellow drainage" did not look too bad. Unfortunately he has had breakdown on the right anterior leg [previously this leg had not been open and he is using a black stocking] he went to see infectious disease and is been put on clindamycin 150 mg daily, I did not verify the dose although I'm not familiar with using clindamycin in this dosing range, perhaps for prophylaxisoo 06/27/17; I brought this patient back today to follow-up on the wound deterioration on the right lower leg together with surrounding cellulitis. I started him on doxycycline 4 days ago. This area looks better however he comes in today  with intense cellulitis on the medial part of his left thigh. This is not have a wound in this area. Extremely tender. We've been using silver alginate to the wounds on the right lower leg left lower leg with bilateral 4 layer compression he is using his external compression pumps once a day 07/04/17; patient's left medial thigh cellulitis looks better. He has not been using his compression pumps as his insert said it was contraindicated with cellulitis. His right leg continues to make improvements all the wounds are still open. We only have one remaining wound on the left posterior calf. Using silver alginate to all open areas. He is on doxycycline which I started a week ago and should be finishing I gave him Augmentin after Thursday's visit for the severe cellulitis on the left medial thigh  which fortunately looks better 07/14/17; the patient's left medial thigh cellulitis has resolved. The cellulitis in his right lower calf on the right also looks better. All of his wounds are stable to improved we've been using silver alginate he has completed the antibiotics I have given him. He has clindamycin 150 mg once a day prescribed by infectious disease for prophylaxis, I've advised him to start this now. We have been using bilateral Unna boots over silver alginate to the wound areas 07/21/17; the patient is been to see infectious disease who noted his recurrent problems with cellulitis. He was not able to tolerate prophylactic clindamycin therefore he is on amoxicillin 500 twice a day. He also had a second daily dose of Lasix added By Dr. Oneta Rack but he is not taking this. Nor is he being completely compliant with his compression pumps a especially not this week. He has 2 remaining wounds one on the right posterior lateral lower leg and one on the left posterior medial lower leg. 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 The patient has Unna boots bilaterally. 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 He has bilateral Unna boots for compression and silver alginate to his wounds. Arrives today with his legs looking as good as I have seen him in quite some time. Not surprisingly his wounds look better as well with improvement on the right lateral leg venous insufficiency wound and also the left medial leg. He is still using the compression pumps once a day 08/11/17; both legs appear to be doing better wounds on the right lateral and left medial legs look better. Skin on the right leg quite good. He is been using silver alginate as the primary dressing. I'm going to use  Anasept gel calcium alginate and maintain all the secondary dressings 08/18/17; the patient continues to actually do quite well. The area on his right lateral leg is just about closed the left medial also looks better although it is still moist in this area. His edema is well controlled we have been using Anasept gel with calcium alginate and the usual secondary dressings, 4 layer compression and once daily use of his compression pumps "always been able to manage 09/01/17; the patient continues to do reasonably well in spite of his trip to [place]. The area on the right lateral leg is epithelialized. Left is much better but still open. He has more edema and more chronic erythema on the left leg [venous inflammation] 09/08/17; he  arrives today with no open wound on the right lateral leg and decently controlled edema. Unfortunately his left leg is not nearly as in his good situation as last week.he apparently had increasing edema starting on Saturday. He edema soaked through into his foot so used a plastic bag to walk around his home. The area on the medial right leg which was his open area is about the same however he has lost surface epithelium on the left lateral which is new and he has significant pain in the Achilles area of the left foot. He is already on amoxicillin chronically for prophylaxis of cellulitis in the left leg 09/15/17; he is completed a week of doxycycline and the cellulitis in the left posterior leg and Achilles area is as usual improved. He still has a lot of edema and fluid soaking through his dressings. There is no open wound on the right leg. He saw infectious disease NP today 09/22/17;As usual 1 we transition him from our compression wraps to his stockings things did not go well. He has several small open areas on the right leg. He states this was caused by the compression wrap on his skin although he did not wear this with the stockings over them. He has several superficial  areas on the left leg medially laterally posteriorly. He does not have any evidence of active cellulitis especially involving the left Achilles The patient is traveling from [facility] Saturday going to [facility]. He states he isn't attempting to get an appointment with a heel objects wound center there to change his dressings. I am not completely certain whether this will work 10/06/17; the patient came in on Friday for a nurse visit and the nurse reported that his legs actually look quite good. He arrives in clinic today for his regular follow-up visit. He has a new wound on his left third toe over the PIP probably caused by friction with his footwear. He has small areas on the left leg and a very superficial but epithelialized area on the right anterior lateral lower leg. Other than that his legs look as good as I've seen him in quite some time. We have been using silver alginate Review of systems; no chest pain no shortness of breath other than this a 10 point review of systems negative 10/20/17; seen by Dr. Meyer Russel last week. He had taken some antibiotics [doxycycline] that he had left over. Dr. Meyer Russel thought he had candida infection and declined to give him further antibiotics. He has a small wound remaining on the right lateral leg several areas on the left leg including a larger area on the left posterior several left medial and anterior and a small wound on the left lateral. The area on the left dorsal third toe looks a lot better. ROS; Gen.; no fever, respiratory no cough no sputum Cardiac no chest pain other than this 10 point review of system is negative 10/30/17; patient arrives today having fallen in the bathtub 3 days ago. It took him a while to get up. He has pain and maceration in the wounds on his left leg which have deteriorated. He has not been using his pumps he also has some maceration on the right lateral leg. 11/03/17; patient continues to have weeping edema especially in  the left leg. This saturates his dressings which were just put on on 12/27. As usual the doxycycline seems to take care of the cellulitis on his lower leg. He is not complaining of fever, chills, or other  systemic symptoms. He states his leg feels a lot better on the doxycycline I gave him empirically. He also apparently gets injections at his primary doctor's officeo Rocephin for cellulitis prophylaxis. I didn't ask him about his compression pump compliance today I think that's probably marginal. Arrives in the clinic with all of his dressings primary and secondary macerated full of fluid and he has bilateral edema 11/10/17; the patient's right leg looks some better although there is still a cluster of wounds on the right lateral. The left leg is inflamed with almost circumferential skin loss medially to laterally although we are still maintaining anteriorly. He does not have overt cellulitis there is a lot of drainage. He is not using compression pumps. We have been using silver alginate to the wound areas, there are not a lot of options here 11/17/17; the patient's right leg continues to be stable although there is still open wounds, better than last week. The inflammation in the left leg is better. Still loss of surface layer epithelium especially posteriorly. There is no overt cellulitis in the amount of edema and his left leg is really quite good, tells me he is using his compression pumps once a day. 11/24/17; patient's right leg has a small superficial wound laterally this continues to improve. The inflammation in the left leg is still improving however we have continuous surface layer epithelial loss posteriorly. There is no overt cellulitis in the amount of edema in both legs is really quite good. He states he is using his compression pumps on the left leg once a day for 5 out of 7 days 12/01/17; very small superficial areas on the right lateral leg continue to improve. Edema control in both legs  is better today. He has continued loss of surface epithelialization and left posterior calf although I think this is better. We have been using silver alginate with large number of absorptive secondary dressings 4 layer on the left Unna boot on the right at his request. He tells me he is using his compression pumps once a day 12/08/17; he has no open area on the right leg is edema control is good here. ooOn the left leg however he has marked erythema and tenderness breakdown of skin. He has what appears to be a wrap injury just distal to the popliteal fossa. 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 There was swelling erythema and breakdown in the right leg. He has much worse skin breakdown in the left leg as well multiple open areas medially and posteriorly as well as laterally. He tells me he has been using his compression pumps but tells me he feels that the drainage out of his leg is worse when he uses a compression pumps. T be fair to him he is been saying this o for a while however I don't know that I have really been listening to this. I wonder if the compression pumps are working properly 12/22/17;. Once again he arrives with severe erythema, weeping edema from the left greater than right leg. Noncompliance with compression pumps. New this visit he is complaining of pain on the lateral aspect of the right leg and the medial aspect of his right thigh. He apparently saw his cardiologist Dr.  Hilty who was ordered an echocardiogram area and I think this is a step in the right direction 12/25/17; started his doxycycline Monday night. There is still intense erythema of the right leg especially in the anterior thigh although  there is less tenderness. The erythema around the wound on the right lateral calf also is less tender. He still complaining of pain in the left heel. His wounds are about the same right lateral left medial left lateral. Superficial but certainly not close to closure. He denies being systemically unwell no fever chills no abdominal pain no diarrhea 12/29/17; back in follow-up of his extensive right calf and right thigh cellulitis. I added amoxicillin to cover possible doxycycline resistant strep. This seems to of done the trick he is in much less pain there is much less erythema and swelling. He has his echocardiogram at 11:00 this morning. X-ray of the left heel was also negative. 01/05/18; the patient arrived with his edema under much better control. Now that he is retired he is able to use his compression pumps daily and sometimes twice a day per the patient. He has a wound on the right leg the lateral wound looks better. Area on the left leg also looks a lot better. He has no evidence of cellulitis in his bilateral thighs I had a quick peak at his echocardiogram. He is in normal ejection fraction and normal left ventricular function. He has moderate pulmonary hypertension moderately reduced right ventricular function. One would have to wonder about chronic sleep apnea although he says he doesn't snore. He'll review the echocardiogram with his cardiologist. 01/12/18; the patient arrives with the edema in both legs under exemplary control. He is using his compression pumps daily and sometimes twice daily. His wound on the right lateral leg is just about closed. He still has some weeping areas on the posterior left calf and lateral left calf although everything is just about closed here as well. I have spoken with Aldean Baker who is the patient's nurse practitioner and infectious disease. She was concerned that the patient had not understood that the parenteral penicillin injections he was  receiving for cellulitis prophylaxis was actually benefiting him. I don't think the patient actually saw that I would tend to agree we were certainly dealing with less infections although he had a serious one last month. 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 He is healed. He'll be placed in juxtalite compression wraps and increase his lymphedema pumps to twice daily. We will follow up again next week to ensure there are no issues with the new regiment. 01/20/18-he is here for evaluation of bilateral lower extremity weeping edema. Yesterday he was placed in compression wrap to the right lower extremity and compression stocking to left lower shrubbery. He states he uses lymphedema pumps last night and again this morning and noted a blister to the left lower extremity. On exam he was noted to have drainage to the right lower extremity. He will be placed in Unna boots bilaterally and follow-up next week 01/26/18; patient was actually discharged a week ago to his own juxta light stockings only to return the next day with bilateral lower extremity weeping edema.he was placed in bilateral Unna boots. He arrives today with pain in the back of his left leg. There is no open area on the right leg however there is a linear/wrap injury on the left leg and weeping edema on the left leg posteriorly. I spoke with infectious disease  about 10 days ago. They were disappointed that the patient elected to discontinue prophylactic intramuscular penicillin shots as they felt it was particularly beneficial in reducing the frequency of his cellulitis. I discussed this with the patient today. He does not share this view. He'll definitely need antibiotics today. Finally he is traveling to [place] and trauma leaving this Saturday and returning a week later and he does not travel with his pumps. He is going by car 01/30/18; patient was seen 4 days ago and brought back in today for review of  cellulitis in the left leg posteriorly. I put him on amoxicillin this really hasn't helped as much as I might like. He is also worried because he is traveling to [name] by car. Finally we will be rewrapping him. There is no open area on the right leg over his left leg has multiple weeping areas as usual 02/09/18; The same wrap on for 10 days. He did not pick up the last doxycycline I prescribed for him. He apparently took 4 days worth he already had. There is nothing open on his right leg and the edema control is really quite good. He's had damage in the left leg medially and laterally especially probably related to the prolonged use of Unna boots 02/12/18; the patient arrived in clinic today for a nurse visit/wrap change. He complained of a lot of pain in the left posterior calf. He is taking doxycycline that I previously prescribed for him. Unfortunately even though he used his stockings and apparently used to compression pumps twice a day he has weeping edema coming out of the lateral part of his right leg. This is coming from the lower anterior lateral skin area. 02/16/18; the patient has finished his doxycycline and will finish the amoxicillin 2 days. The area of cellulitis in the left calf posteriorly has resolved. He is no longer having any pain. He tells me he is using his compression pumps at least once a day sometimes twice. 02/23/18; the patient finished his doxycycline and Amoxil last week. On Friday he noticed a small erythematous circle about the size of a quarter on the left lower leg just above his ankle. This rapidly expanded and he now has erythema on the lateral and posterior part of the thigh. This is bright red. Also has an area on the dorsal foot just above his toes and a tender area just below the left popliteal fossa. He came off his prophylactic penicillin injections at his own insistence one or 2 months ago. This is obviously deteriorated since then 03/02/18; patient is  on doxycycline and Amoxil. Culture I did last week of the weeping area on the back of his left calf grew group B strep. I have therefore renewed the amoxicillin 500 3 times a day for a further week. He has not been systemically unwell. Still complaining of an area of discomfort right under his left popliteal fossa. There is no open wound on the right leg. He tells me that he is using his pumps twice a day on most days 03/09/18; patient arrives in clinic today completing his amoxicillin today. The cellulitis on his left leg is better. Furthermore he tells me that he had intramuscular penicillin shots that his primary care office today. However he also states that the wrap on his right leg fell down shortly after leaving clinic last week. He developed a large blister that was present when he came in for a nurse visit later in the week and then he  developed intense discomfort around this area.He tells me he is using his compression pumps 03/16/18; the patient has completed his doxycycline. The infectious part of this/cellulitis in the left heel area left popliteal area is a lot better. He has 2 open areas on the right calf. Still areas on the left calf but this is a lot better as well. 03/24/18; the patient arrives complaining of pain in the left popliteal area again. He thinks some of this is wrap injury. He has no open area on the right leg and really no open area on the left calf either except for the popliteal area. He claims to be compliant with the compression pumps 03/31/18; I gave him doxycycline last week because of cellulitis in the left popliteal area. This is a lot better although the surface epithelium is denuded off and response to this. He arrives today with uncontrolled edema in the right calf area as well as a fingernail injury in the right lateral calf. There is only a few open areas on the left 04/06/18; I gave him amoxicillin doxycycline over the last 2 weeks that the amoxicillin should be  completing currently. He is not complaining of any pain or systemic symptoms. The only open areas see has is on the right lateral lower leg paradoxically I cannot see anything on the left lower leg. He tells me he is using his compression pumps twice a day on most days. Silver alginate to the wounds that are open under 4 layer compression 04/13/18; he completed antibiotics and has no new complaints. Using his compression pumps. Silver alginate that anything that's opened 04/20/18; he is using his compression pumps religiously. Silver alginate 4 layer compression anything that's opened. He comes in today with no open wounds on the left leg but 3 on the right including a new one posteriorly. He has 2 on the right lateral and one on the right posterior. He likes Unna boots on the right leg for reasons that aren't really clear we had the usual 4 layer compression on the left. It may be necessary to move to the 4 layer compression on the right however for now I left them in the Unna boots 04/27/18; he is using his compression pumps at least once a day. He has still the wounds on the right lateral calf. The area right posteriorly has closed. He does not have an open wound on the left under 4 layer compression however on the dorsal left foot just proximal to the toes and the left third toe 2 small open areas were identified 05/11/18; he has not uses compression pumps. The areas on the right lateral calf have coalesced into one large wound necrotic surface. On the left side he has one small wound anteriorly however the edema is now weeping out of a large part of his left leg. He says he wasn't using his pumps because of the weeping fluid. I explained to him that this is the time he needs to pump more 05/18/18; patient states he is using his compression pumps twice a day. The area on the right lateral large wound albeit superficial. On the left side he has innumerable number of small new wounds on the left calf  particularly laterally but several anteriorly and medially. All these appear to have healthy granulated base these look like the remnants of blisters however they occurred under compression. The patient arrives in clinic today with his legs somewhat better. There is certainly less edema, less multiple open areas on the  left calf and the right anterior leg looks somewhat better as well superficial and a little smaller. However he relates pain and erythema over the last 3-4 days in the thigh and I looked at this today. He has not been systemically unwell no fever no chills no change in blood sugar values 05/25/18; comes in today in a better state. The severe cellulitis on his left leg seems better with the Keflex. Not as tender. He has not been systemically unwell ooHard to find an open wound on the left lower leg using his compression pumps twice a day ooThe confluent wounds on his right lateral calf somewhat better looking. These will ultimately need debridement I didn't do this today. 06/01/18; the severe cellulitis on the left anterior thigh has resolved and he is completed his Keflex. ooThere is no open wound on the left leg however there is a superficial excoriation at the base of the third toe dorsally. Skin on the bottom of his left foot is macerated looking. ooThe left the wounds on the lateral right leg actually looks some better although he did require debridement of the top half of this wound area with an open curet 06/09/18 on evaluation today patient appears to be doing poorly in regard to his right lower extremity in particular this appears to likely be infected he has very thick purulent discharge along with a bright green tent to the discharge. This makes me concerned about the possibility of pseudomonas. He's also having increased discomfort at this point on evaluation. Fortunately there does not appear to be any evidence of infection spreading to the other location at this  time. 06/16/18 on evaluation today patient appears to actually be doing fairly well. His ulcer has actually diminished in size quite significantly at this point which is good news. Nonetheless he still does have some evidence of infection he did see infectious disease this morning before coming here for his appointment. I did review the results of their evaluation and their note today. They did actually have him discontinue the Cipro and initiate treatment with linezolid at this time. He is doing this for the next seven days and they recommended a follow-up in four months with them. He is the keep a log of the need for intermittent antibiotic therapy between now and when he falls back up with infectious disease. This will help them gaze what exactly they need to do to try and help them out. 06/23/18; the patient arrives today with no open wounds on the left leg and left third toe healed. He is been using his compression pumps twice a day. On the right lateral leg he still has a sizable wound but this is a lot better than last time I saw this. 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 Has been using silver alginate under 4 layer compression 06/30/18; the only open wound he has is on the right lateral leg and this looks healthy. No debridement is required. We have been using silver alginate. He does not have an open wound on the left leg. There is apparently some drainage from the dorsal proximal third toe on the left although I see no open wound here. 07/03/18 on evaluation today patient was actually here just for a nurse visit rapid change. However when he was here on Wednesday for his rat change due to having been healed on the left and then developing  blisters we initiated the wrap again knowing that he would be back today for [place] to reevaluate and see were at. Unfortunately he has developed some  cellulitis into the proximal portion of his right lower extremity even into the region of his thigh. 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 He was placed on one as what at that point. Nonetheless he is done with that and has been tolerating it well otherwise. Doxycycline which in the past really did not seem to be effective for him. Nonetheless I think the best option may be for [place] to definitely reinitiate the antibiotics for a longer period of time. 07/07/18; since I last saw this patient a week ago he has had a difficult time. At that point he did not have an open wound on his left leg. We transitioned him into juxta light stockings. He was apparently in the clinic the next day with blisters on the left lateral and left medial lower calf. He also had weeping edema fluid. He was put back into a compression wrap. He was also in the clinic on Friday with intense erythema in his right thigh. Per the patient he was started on Bactrim however that didn't work at all in terms of relieving his pain and swelling. He has taken 3 doxycycline that he had left over from last time and that seems to of helped. He has blistering on the right thigh as well. 07/14/18; the erythema on his right thigh has gotten better with doxycycline that he is finishing. 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 Presumably this cellulitis in the thigh was not related to that although I think this is a bit concerning going forward. He still has an area on the right lateral calf the blister on the right medial calf just below the knee that was discussed above. On the left 2 small open areas left medial and left lateral. Edema control is adequate. He is using his compression pumps twice a day 07/20/18; continued improvement in the condition of both legs especially the edema in his bilateral thighs. He tells me he is been  losing weight through a combination of diet and exercise. He is using his compression pumps twice a day. So overall she made to the remaining wounds 07/27/2018; continued improvement in condition of both legs. His edema is well controlled. The area on the right lateral leg is just about closed he had one blisters show up on the medial left upper calf. We have him in 4 layer compression. He is going on a 10-day trip to [place]. He will be driving. He wants to wear Unna boots because of the lessening amount of constriction. He will not use compression pumps while he is away 08/05/18 on evaluation today patient actually appears to be doing decently well all things considered in regard to his bilateral lower extremities. The worst ulcer is actually only posterior aspect of his left lower extremity with a four layer compression wrap cut into his leg a couple weeks back. He did have a trip and actually had [employer] for the trip that he is worn since he was last here. Nonetheless he feels like the [employer] actually do better for him his swelling is up a little bit but he also with his trip was not taking his Lasix on  a regular set schedule like he was supposed to be. He states that obviously the reason being that he cannot drive and keep going without having to urinate too frequently which makes it difficult. He did not have his pumps with him while he was away either which I think also maybe playing a role here too. 08/13/2018; the patient only has a small open wound on the right lateral calf which is a big improvement in the last month or 2. He also has the area posteriorly just below the posterior fossa on the left which I think was a wrap injury from several weeks ago. He has no current evidence of cellulitis. He tells me he is back into his compression pumps twice a day. He also tells me that while he was at the laundromat somebody stole a section of his  extremitease stockings 08/20/2018; back in the clinic with a much improved state. He only has small areas on the right lateral mid calf which is just about healed. This was is more substantial area for quite a prolonged period of time. He has a small open area on the left anterior tibia. The area on the posterior calf just below the popliteal fossa is closed today. He is using his compression pumps twice a day 08/28/2018; patient has no open wound on the right leg. He has a smattering of open areas on the calf with some weeping lymphedema. More problematically than that it looks as though his wraps of slipped down in his usual he has very angry upper area of edema just below the right medial knee and on the right lateral calf. He has no open area on his feet. The patient is traveling to [facility] next week. I will send him in an antibiotic. We will continue to wrap the right leg. We ordered extremitease stockings for him last week and I plan to transition the right leg to a stocking when he gets home which will be in 10 days time. As usual he is very reluctant to take his pumps with him when he travels 09/07/2018; patient returns from [facility]. He shows me a picture of his left leg in the mid part of his trip last week with intense fire engine erythema. The picture look bad enough I would have considered sending him to the [name]. Instead he went to the wound care center in [facility]. They did not prescribe him antibiotics but he did take some doxycycline he had leftover from a previous visit. I had given him trimethoprim sulfamethoxazole before he left this did not work according to the patient. This is resulted in some improvement fortunately. He comes back with a large wound on the left posterior calf. Smaller area on the left anterior tibia. Denuded blisters on the dorsal left foot over his toes. Does not have much in the way of wounds on the right leg although he does have a  very tender area on the right posterior area just below the popliteal fossa also suggestive of infection. He promises me he is back on his pumps twice a day 09/15/2018; the intense cellulitis in his left lower calf is a lot better. The wound area on the posterior left calf is also so better. However he has reasonably extensive wounds on the dorsal aspect of his second and third toes and the proximal foot just at the base of the toes. There is nothing open on the right leg 09/22/2018; the patient has excellent edema control in  his legs bilaterally. He is using his external compression pumps twice a day. He has no open area on the right leg and only the areas in the left foot dorsally second and third toe area on the left side. He does not have any signs of active cellulitis. 10/06/2018; the patient has good edema control bilaterally. He has no open wound on the right leg. There is a blister in the posterior aspect of his left calf that we had to deal with today. He is using his compression pumps twice a day. There is no signs of active cellulitis. We have been using silver alginate to the wound areas. He still has vulnerable areas on the base of his left first second toes dorsally He has a his extremities stockings and we are going to transition him today into the stocking on the right leg. He is cautioned that he will need to continue to use the compression pumps twice a day. If he notices uncontrolled edema in the right leg he may need to go to 3 times a day. 10/13/2018; the patient came in for a nurse check on Friday he has a large flaccid blister on the right medial calf just below the knee. We unroofed this. He has this and a new area underneath the posterior mid calf which was undoubtedly a blister as well. He also has several small areas on the right which is the area we put his extremities stocking on. 10/19/2018; the patient went to see infectious disease this morning I am not sure if that was  a routine follow-up in any case the doxycycline I had given him was discontinued and started on linezolid. He has not started this. It is easy to look at his left calf and the inflammation and think this is cellulitis however he is very tender in the tissue just below the popliteal fossa and I have no doubt that there is infection going on here. He states the problem he is having is that with the compression pumps the edema goes down and then starts walking the wrap falls down. We will see if we can adhere this. He has 1 or 2 minuscule open areas on the right still areas that are weeping on the posterior left calf, the base of his left second and third toes 10/26/18; back today in clinic with quite of skin breakdown in his left anterior leg. This may have been infection the area below the popliteal fossa seems a lot better however tremendous epithelial loss on the left anterior mid tibia area over quite inexpensive tissue. He has 2 blisters on the right side but no other open wound here. 10/29/2018; came in urgently to see [place] today and we worked him in for review. He states that the 4 layer compression on the right leg caused pain he had to cut it down to roughly his mid calf this caused swelling above the wrap and he has blisters and skin breakdown today. As a result of the pain he has not been using his pumps. Both legs are a lot more edematous and there is a lot of weeping fluid. 11/02/18; arrives in clinic with continued difficulties in the right leg> left. Leg is swollen and painful. multiple skin blisters and new open areas especially laterally. He has not been using his pumps on the right leg. 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 He is not systemically unwell. 11/09/2018; the patient claims he is being compliant  with his pumps. He is finished the doxycycline I gave him last week. Culture I did of the wound on the right lateral leg showed a few  very resistant methicillin staph aureus. This was resistant to doxycycline. Nevertheless he states the pain in the leg is a lot better which makes me wonder if the cultured organism was not really what was causing the problem nevertheless this is a very dangerous organism to be culturing out of any wound. His right leg is still a lot larger than the left. He is using an Radio broadcast assistant on this area, he blames a 4-layer compression for causing the original skin breakdown which I doubt is true however I cannot talk him out of it. We have been using silver alginate to all of these areas which were initially blisters 11/16/2018; patient is being compliant with his external compression pumps at twice a day. Miraculously he arrives in clinic today with absolutely no open wounds. He has better edema control on the left where he has been using 4 layer compression versus wound of wounds on the right and I pointed this out to him. There is no inflammation in the skin in his lower legs which is also somewhat unusual for him. There is no open wounds on the dorsal left foot. He has extremitease stockings at home and I have asked him to bring these in next week. 11/25/18 patient's lower extremity on examination today on the left appears for the most part to be wound free. He does have an open wound on the lateral aspect of the right lower extremity but this is minimal compared to what I've seen in past. He does request that we go ahead and wrap the left leg as well even though there's nothing open just so hopefully it will not reopen in short order. 1/28; patient has superficial open wounds on the right lateral calf left anterior calf and left posterior calf. His edema control is adequate. 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 We have been using silver alginate as the primary dressing. He claims compliance with his compression  pumps 2/4; patient has superficial open wounds on numerous areas of his left calf and again one on the left dorsal foot. The areas on the right lateral calf have healed. The cellulitis that I gave him doxycycline for last week is also resolved this was mostly on the left anterior calf just below the tibial tuberosity. His edema looks fairly well-controlled. He tells me he went to see his primary doctor today and had blood work ordered 2/11; once again he has several open areas on the left calf left tibial area. Most of these are small and appear to have healthy granulation. He does not have anything open on the right. The edema and control in his thighs is pretty good which is usually a good indication he has been using his pumps as requested. 2/18; he continues to have several small areas on the left calf and left tibial area. Most of these are small healthy granulation. We put him in his stocking on the right leg last week and he arrives with a superficial open area over the right upper tibia and a fairly large area on the right lateral tibia in similar condition. His edema control actually does not look too bad, he claims to be using his compression pumps twice a day 2/25. Continued small areas on the left  calf and left tibial area. New areas especially on the right are identified just below the tibial tuberosity and on the right upper tibia itself. There are also areas of weeping edema fluid even without an obvious wound. He does not have a considerable degree of lymphedema but clearly there is more edema here than his skin can handle. He states he is using the pumps twice a day. We have an Unna boot on the right and 4 layer compression on the left. 3/3; he continues to have an area on the right lateral calf and right posterior calf just below the popliteal fossa. There is a fair amount of tenderness around the wound on the popliteal fossa but I did not see any evidence of cellulitis, could just be  that the wrap came down and rubbed in this area. ooHe does not have an open area on the left leg however there is an area on the left dorsal foot at the base of the third toe ooWe have been using silver alginate to all wound areas 3/10; he did not have an open area on his left leg last time he was here a week ago. T oday he arrives with a horizontal wound just below the tibial tuberosity and an area on the left lateral calf. He has intense erythema and tenderness in this area. The area is on the right lateral calf and right posterior calf better than last week. We have been using silver alginate as usual 3/18 - Patient returns with 3 small open areas on left calf, and 1 small open area on right calf, the skin looks ok with no significant erythema, he continues the UNA boot on right and 4 layer compression on left. The right lateral calf wound is closed , the right posterior is small area. we will continue silver alginate to the areas. 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 He is been tolerating the dressing changes without complication. Fortunately she has made excellent improvement in regard to the overall status of his wounds. Unfortunately every time we cease wrapping him he ends up reopening in causing more significant issues at that point. Again I'm unsure of the best direction to take although I think the lymphedema clinic may be appropriate for him. 02/03/19 on evaluation today patient appears to be doing well in regard to the wounds that we saw him for last week unfortunately he has a new area on the proximal portion of his right medial/posterior lower extremity where the wrap somewhat slowed down and caused swelling and a blister to rub and open. Unfortunately this is the only opening that he  has on either leg at this point. 02/17/19 on evaluation today patient's bilateral lower extremities appear to be doing well. He still completely healed in regard to the left lower extremity. In regard to the right lower extremity the area where the wrap and slid down and caused the blister still seems to be slightly open although this is dramatically better than during the last evaluation two weeks ago. I'm very pleased with the way this stands overall. 03/03/19 on evaluation today patient appears to be doing well in regard to his right lower extremity in general although he did have a new blister open this does not appear to be showing any evidence of active infection at  this time. Fortunately there's No fevers, chills, nausea, or vomiting noted at this time. Overall I feel like he is making good progress it does feel like that the right leg will we perform the [employer] seems to do with a bit better than three layer wrap on the left which slid down on him. We may switch to doing bilateral in the book wraps. 5/4; I have not seen Mr. Babiarz in quite some time. According to our case manager he did not have an open wound on his left leg last week. He had 1 remaining wound on the right posterior medial calf. He arrives today with multiple openings on the left leg probably were blisters and/or wrap injuries from Unna boots. I do not think the Unna boot's will provide adequate compression on the left. I am also not clear about the frequency he is using the compression pumps. 03/17/19 on evaluation today patient appears to be doing excellent in regard to his lower extremities compared to last week's evaluation apparently. He had gotten significantly worse last week which is unfortunate. The [employer] wrap on the left did not seem to do very well for him at all and in fact it didn't control his swelling significantly enough he had an additional outbreak. Subsequently we go back to the four layer compression wrap  on the left. This is good news. At least in that he is doing better and the wound seem to be killing him. He still has not heard anything from the lymphedema clinic. 03/24/19 on evaluation today patient actually appears to be doing much better in regard to his bilateral lower Trinity as compared to last week when I saw him. Fortunately there's no signs of active infection at this time. He has been tolerating the dressing changes without complication. Overall I'm extremely pleased with the progress and appearance in general. 04/07/19 on evaluation today patient appears to be doing well in regard to his bilateral lower extremities. His swelling is significantly down from where it was previous. With that being said he does have a couple blisters still open at this point but fortunately nothing that seems to be too severe and again the majority of the larger openings has healed at this time. 04/14/19 on evaluation today patient actually appears to be doing quite well in regard to his bilateral lower extremities in fact I'm not even sure there's anything significantly open at this time at any site. Nonetheless he did have some trouble with these wraps where they are somewhat irritating him secondary to the fact that he has noted that the graph wasn't too close down to the end of this foot in a little bit short as well up to his knee. Otherwise things seem to be doing quite well. 04/21/19 upon evaluation today patient's wound bed actually showed evidence of being completely healed in regard to both lower extremities which is excellent news. There does not appear to be any signs of active infection which is also good news. I'm very pleased in this regard. No fevers, chills, nausea, or vomiting noted at this time. 04/28/19 on evaluation today patient appears to be doing a little bit worse in regard to both lower extremities on the left mainly due to the fact that when he went infection disease the wrap was not  wrapped quite high enough he developed a blister above this. On the right he is a small open area of nothing too significant but again this is continuing to give him some  trouble he has been were in the Velcro compression that he has at home. 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 He's been tolerating the dressing changes without complication. Fortunately there's no signs of active infection at this time. No fevers, chills, nausea, or vomiting noted at this time. We have been trying to get an appointment with her lymphedema clinic in [facility] but unfortunately nobody can get them on phone with not been able to even fax information over the patient likewise is not been able to get in touch with them. Overall I'm not sure exactly what's going on here with to reach out again today. 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 Still having a lot of drainage unfortunately. He tells me especially in the left but even on the right. There's no signs of active infection which is good news we've been using so ratcheted up to this point. 05/19/19 on evaluation today patient actually appears to be doing quite well with regard to his left lower extremity which is great news. Fortunately in regard to the right lower extremity has an issues with his wrap and he subsequently did remove this from what I'm understanding. Nonetheless long story short is what he had rewrapped once he removed it subsequently had maggots underneath this wrap whenever he came in for evaluation today. With that being said they were obviously completely cleaned away by the nursing staff. The visit today which is excellent news. However he does appear to potentially have some infection around the right ankle region where the maggots were located as well. He will likely require  anabiotic therapy today. 05/26/19 on evaluation today patient actually appears to be doing much better in regard to his bilateral lower extremities. I feel like the infection is under much better control. With that being said there were maggots noted when the wrap was removed yet again today. Again this could have potentially been left over from previous although at this time there does not appear to be any signs of significant drainage there was obviously on the wrap some drainage as well this contracted gnats or otherwise. Either way I do not see anything that appears to be doing worse in my pinion and in fact I think his drainage has slowed down quite significantly likely mainly due to the fact to his infection being under better control. 06/02/2019 on evaluation today patient actually appears to be doing well with regard to his bilateral lower extremities there is no signs of active infection at this time which is great news. With that being said he does have several open areas more so on the right than the left but nonetheless these are all significantly better than previously noted. 06/09/2019 on evaluation today patient actually appears to be doing well. His wrap stayed up and he did not cause any problems he had more drainage on the right compared to the left but overall I do not see any major issues at this time which is great news. 06/16/2019 on evaluation today patient appears to be doing excellent with regard to his lower extremities the only area that is open is a new blister that can have opened as of today on the medial ankle on the left. Other than this he really seems to be doing great I see no major issues at this point. 06/23/2019 on evaluation today patient  appears to be doing quite well with regard to his bilateral lower extremities. In fact he actually appears to be almost completely healed there is a small area of weeping noted of the right lower extremity just above the ankle.  Nonetheless fortunately there is no signs of active infection at this time which is good news. No fevers, chills, nausea, vomiting, or diarrhea. 8/24; the patient arrived for a nurse visit today but complained of very significant pain in the left leg and therefore I was asked to look at this. Noted that he did not have an open area on the left leg last week nevertheless this was wrapped. The patient states that he is not been able to put his compression pumps on the left leg because of the discomfort. He has not been systemically unwell 06/30/2019 on evaluation today patient unfortunately despite being excellent last week is doing much worse with regard to his left lower extremity today. In fact he had to come in for a nurse on Monday where his left leg had to be rewrapped due to excessive weeping Dr. Leanord Hawking placed him on doxycycline at that point. Fortunately there is no signs of active infection Systemically at this time which is good news. 07/07/2019 in regard to the patient's wounds today he actually seems to be doing well with his right lower extremity there really is nothing open or draining at this point this is great news. Unfortunately the left lower extremity is given him additional trouble at this time. There does not appear to be any signs of active infection nonetheless he does have a lot of edema and swelling noted at this point as well as blistering all of which has led to a much more poor appearing leg at this time compared to where it was 2 weeks ago when it was almost completely healed. Obviously this is a little discouraging for the patient. He is try to contact the lymphedema clinic in [place] he has not been able to get through to them. 07/14/2019 on evaluation today patient actually appears to be doing slightly better with regard to his left lower extremity ulcers. Overall I do feel like at least at the top of the wrap that we have been placing this area has healed quite nicely  and looks much better. The remainder of the leg is showing signs of improvement. Unfortunately in the thigh area he still has an open region on the left and again on the right he has been utilizing just a Band-Aid on an area that also opened on the thigh. Again this is an area that were not able to wrap although we did do an Ace wrap to provide some compression that something that obviously is a little less effective than the compression wraps we have been using on the lower portion of the leg. He does have an appointment with the lymphedema clinic in [name] on Friday. 07/21/2019 on evaluation today patient appears to be doing better with regard to his lower extremity ulcers. He has been tolerating the dressing changes without complication. Fortunately there is no signs of active infection at this time. No fevers, chills, nausea, vomiting, or diarrhea. I did receive the paperwork from the physical therapist at the lymphedema clinic in [place]. Subsequently I signed off on that this morning and sent that back to him for further progression with the treatment plan. 07/28/2019 on evaluation today patient appears to be doing very well with regard to his right lower extremity where I do not  see any open wounds at this point. Fortunately he is feeling great as far as that is concerned as well. In regard to the left lower extremity he has been having issues with still several areas of weeping and edema although the upper leg is doing better his lower leg still I think is going require the compression wrap at this time. No fevers, chills, nausea, vomiting, or diarrhea. 08/04/2019 on evaluation today patient unfortunately is having new wounds on the right lower extremity. Again we have been using Unna boot wrap on that side. We switched him to using his juxta lite wrap at home. With that being said he tells me he has been using it although his legs extremely swollen and to be honest really does not  appear that he has been. I cannot know that for sure however. Nonetheless he has multiple new wounds on the right lower extremity at this time. Obviously we will have to see about getting this rewrapped for him today. 08/11/2019 on evaluation today patient appears to be doing fairly well with regard to his wounds. He has been tolerating the dressing changes including the compression wraps without complication. He still has a lot of edema in his upper thigh regions bilaterally he is supposed to be seeing the lymphedema clinic on the 15th of this month once his wraps arrive for the upper part of his legs. 08/18/2019 on evaluation today patient appears to be doing well with regard to his bilateral lower extremities at this point. He has been tolerating the dressing changes without complication. Fortunately there is no signs of active infection which is also good news. He does have a couple weeping areas on the first and second toe of the right foot he also has just a small area on the left foot upper leg and a small area on the left lower leg but overall he is doing quite well in my opinion. He is supposed to be getting his wraps shortly in fact tomorrow and then subsequently is seeing the lymphedema clinic next Wednesday on the 21st. Of note he is also leaving on the 25th to go on vacation for a week to the beach. For that reason and since there is some uncertainty about what there can be doing at lymphedema clinic next Wednesday I am get a make an appointment for next Friday here for [place] to see what we need to do for him prior to him leaving for vacation. 10/23; patient arrives in considerable pain predominantly in the upper posterior calf just distal to the popliteal fossa also in the wound anteriorly above the major wound. This is probably cellulitis and he has had this recurrently in the past. He has no open wound on the right side and he has had an Radio broadcast assistant in that area. Finally I note that he has an  area on the left posterior calf which by enlarge is mostly epithelialized. This protrudes beyond the borders of the surrounding skin in the setting of dry scaly skin and lymphedema. The patient is leaving for [facility] on Sunday. Per his longstanding pattern, he will not take his compression pumps with him predominantly out of fear that they will be stolen. He therefore asked that we put a Unna boot back on the right leg. He will also contact the wound care center in [facility] to see if they can change his dressing in the mid week. 11/3; patient returned from his vacation to [facility]. He was seen on 1  occasion at their wound care center. They did a 2 layer compression system as they did not have our 4-layer wrap. I am not completely certain what they put on the wounds. They did not change the Unna boot on the right. The patient is also seeing a lymphedema specialist physical therapist in [place]. It appears that he has some compression sleeve for his thighs which indeed look quite a bit better than I am used to seeing. He pumps over these with his external compression pumps. 11/10; the patient has a new wound on the right medial thigh otherwise there is no open areas on the right. He has an area on the left leg posteriorly anteriorly and medially and an area over the left second toe. We have been using silver alginate. He thinks the injury on his thigh is secondary to friction from the compression sleeve he has. 11/17; the patient has a new wound on the right medial thigh last week. He thinks this is because he did not have a underlying stocking for his thigh juxta lite apparatus. He now has this. The area is fairly large and somewhat angry but I do not think he has underlying cellulitis. ooHe has a intact blister on the right anterior tibial area. ooSmall wound on the right great toe dorsally ooSmall area on the medial left calf. 11/30; the patient does not have any open areas on  his right leg and we did not take his juxta lite stocking off. However he states that on Friday his compression wrap fell down lodging around his upper mid calf area. As usual this creates a lot of problems for him. He called urgently today to be seen for a nurse visit however the nurse visit turned into a provider visit because of extreme erythema and pain in the left anterior tibia extending laterally and posteriorly. The area that is problematic is extensive 10/06/2019 upon evaluation today patient actually appears to be doing poorly in regard to his left lower extremity. He Dr. Leanord Hawking did place him on doxycycline this past Monday apparently due to the fact that he was doing much worse in regard to this left leg. Fortunately the doxycycline does seem to be helping. Unfortunately we are still having a very difficult time getting his edema under any type of control in order to anticipate discharge at some point. The only way were really able to control his lymphedema really is with compression wraps and that has only even seemingly temporary. He has been seeing a lymphedema clinic they are trying to help in this regard but still this has been somewhat frustrating in general for the patient. 10/13/19 on evaluation today patient appears to be doing excellent with regard to his right lower extremity as far as the wounds are concerned. His swelling is still quite extensive unfortunately. He is still having a lot of drainage from the thigh areas bilaterally which is unfortunate. He's been going to lymphedema clinic but again he still really does not have this edema under control as far as his lower extremities are concern. With regard to his left lower extremity this seems to be improving and I do believe the doxycycline has been of benefit for him. He is about to complete the doxycycline. 10/20/2019 on evaluation today patient appears to be doing poorly in regard to his bilateral lower extremities. More in  the right thigh he has a lot of irritation at this site unfortunately. In regard to the left lower extremity the wrap was not  quite as high it appears and does seem to have caused him some trouble as well. Fortunately there is no evidence of systemic infection though he does have some blue-green drainage which has me concerned for the possibility of Pseudomonas. He tells me he is previously taking Cipro without complications and he really does not care for Levaquin however due to some of the side effects he has. He is not allergic to any medications specifically antibiotics that were aware of. 10/27/2019 on evaluation today patient actually does appear to be for the most part doing better when compared to last week's evaluation. With that being said he still has multiple open wounds over the bilateral lower extremities. He actually forgot to start taking the Cipro and states that he still has the whole bottle. He does have several new blisters on left lower extremity today I think I would recommend he go ahead and take the Cipro based on what I am seeing at this point. 12/30-Patient comes at 1 week visit, 4 layer compression wraps on the left and Unna boot on the right, primary dressing Xtrasorb and silver alginate. Patient is taking his Cipro and has a few more days left probably 5-6, and the legs are doing better. He states he is using his compressions devices which I believe he has 11/10/2019 on evaluation today patient actually appears to be much better than last time I saw him 2 weeks ago. His wounds are significantly improved and overall I am very pleased in this regard. Fortunately there is no signs of active infection at this time. He is just a couple of days away from completing Cipro. Overall his edema is much better he has been using his lymphedema pumps which I think is also helping at this point. 11/17/2019 on evaluation today patient appears to be doing excellent in regard to his wounds in  general. His legs are swollen but not nearly as much as they have been in the past. Fortunately he is tolerating the compression wraps without complication. No fevers, chills, nausea, vomiting, or diarrhea. He does have some erythema however in the distal portion of his right lower extremity specifically around the forefoot and toes there is a little bit of warmth here as well. 11/24/2019 on evaluation today patient appears to be doing well with regard to his right lower extremity I really do not see any open wounds at this point. His left lower extremity does have several open areas and his right medial thigh also is open. Other than this however overall the patient seems to be making good progress and I am very pleased at this point. 12/01/2019 on evaluation today patient appears to be doing poorly at this point in regard to his left lower extremity has several new blisters despite the fact that we have him in compression wraps. In fact he had a 4-layer compression wrap, his upper thigh wrapped from lymphedema clinic, and a juxta light over top of the 4 layer compression wrap the lymphedema clinic applied and despite all this he still develop blisters underneath. Obviously this does have me concerned about the fact that unfortunately despite what we are doing to try to get wounds healed he continues to have new areas arise I do not think he is ever good to be at the point where he can realistically just use wraps at home to keep things under control. Typically when we heal him it takes about 1-2 days before he is back in the clinic with severe breakdown  and blistering of his lower extremities bilaterally. This is happened numerous times in the past. Unfortunately I think that we may need some help as far as overall fluid overload to kind of limit what we are seeing and get things under better control. 12/08/2019 on evaluation today patient presents for follow-up concerning his ongoing bilateral lower  extremity edema. Unfortunately he is still having quite a bit of swelling the compression wraps are controlling this to some degree but he did see Dr. [name] his cardiologist I do have that available for review today as far as the appointment was concerned that was on 12/06/2019. Obviously that she has been 2 days ago. The patient states that he is only been taking the Lasix 80 mg 1 time a day he had told me previously he was taking this twice a day. Nonetheless Dr. [name] recommended this be up to 80 mg 2 times a day for the patient as he did appear to be fluid overloaded. With that being said the patient states he did this yesterday and he was unable to go anywhere or do anything due to the fact that he was constantly having to urinate. Nonetheless I think that this is still good to be something that is important for him as far as trying to get his edema under control at all things that he is going to be able to just expect his wounds to get under control and things to be better without going through at least a period of time where he is trying to stabilize his fluid management in general and I think increasing the Lasix is likely the first step here. It was also mentioned the possibility that the patient may require metolazone. With that being said he wanted to have the patient take Lasix twice a day first and then reevaluating 2 months to see where things stand. 12/15/2019 upon evaluation today patient appears to be doing regard to his legs although his toes are showing some signs of weeping especially on the left at this point to some degree on the right. There does not appear to be any signs of active infection and overall I do feel like the compression wraps are doing well for him but he has not been able to take the Lasix at home and the increased dose that Dr. [name] recommended. He tells me that just not go to be feasible for him. Nonetheless I think in this case he should probably send a message to  Dr. [name] in order to discuss options from the standpoint of possible admission to get the fluid off or otherwise going forward. 12/22/2019 upon evaluation today patient appears to be doing fairly well with regard to his lower extremities at this point. In fact he would be doing excellent if it was not for the fact that his right anterior thigh apparently had an allergic reaction to adhesive tape that he used. The wound itself that we have been monitoring actually appears to be healed. There is a lot of irritation at this point. 12/29/2019 upon evaluation today patient appears to be doing well in regard to his lower extremities. His left medial thigh is open and somewhat draining today but this is the only region that is open the right has done much better with the treatment utilizing the steroid cream that I prescribed for him last week. Overall I am pleased in that regard. Fortunately there is no signs of active infection at this time. No fevers, chills, nausea, vomiting, or diarrhea.  01/05/2020 upon evaluation today patient appears to be doing more poorly in regard to his right lower extremity at this point upon evaluation today. Unfortunately he continues to have issues in this regard and I think the biggest issue is controlling his edema. This obviously is not very well controlled at this point is been recommended that he use the Lasix twice a day but he has not been able to do that. Unfortunately I think this is leading to an issue where honestly he is not really able to effectively control his edema and therefore the wounds really are not doing significantly better. I do not think that he is going to be able to keep things under good control unless he is able to control his edema much better. I discussed this again in great detail with him today. 01/12/2020 good news is patient actually appears to be doing quite well today at this point. He does have an appointment with lymphedema clinic tomorrow.  His legs appear healed and the toe on the left is almost completely healed. In general I am very pleased with how things stand at this point. 01/19/2020 upon evaluation today patient appears to actually be doing well in regard to his lower extremities there is nothing open at this point. Fortunately he has done extremely well more recently. Has been seeing lymphedema clinic as well. With that being said he has Velcro wraps for his lower legs as well as his upper legs. The only wound really is on his toe which is the right great toe and this is barely anything even there. With all that being said I think it is good to be appropriate today to go ahead and switch him over to the Velcro compression wraps. 01/26/2020 upon evaluation today patient appears to be doing worse with regard to his lower extremities after last week switch him to Velcro compression wraps. Unfortunately he lasted less than 24 hours he did not have the sock portion of his Velcro wrap on the left leg and subsequently developed a blister underneath the Velcro portion. Obviously this is not good and not what we were looking for at this point. He states the lymphedema clinic did tell him to wear the wrap for 23 hours and take him off for 1 I am okay with that plan but again right now we got a get things back under control again he may have some cellulitis noted as well. 02/02/2020 upon evaluation today patient unfortunately appears to have several areas of blistering on his bilateral lower extremities today mainly on the feet. His legs do seem to be doing somewhat better which is good news. Fortunately there is no evidence of active infection at this time. No fevers, chills, nausea, vomiting, or diarrhea. 02/16/2020 upon evaluation today patient appears to be doing well at this time with regard to his legs. He has a couple weeping areas on his toes but for the most part everything is doing better and does appear to be sealed up on his legs  which is excellent news. We can continue with wrapping him at this point as he had every time we discontinue the wraps he just breaks out with new wounds. There is really no point in is going forward with this at this point. 03/08/2020 upon evaluation today patient actually appears to be doing quite well with regard to his lower extremity ulcers. He has just a very superficial and really almost nonexistent blister on the left lower extremity he has in general  done very well with the compression wraps. With that being said I do not see any signs of infection at this time which is good news. 03/29/2020 upon evaluation today patient appears to be doing well with regard to his wounds currently except for where he had several new areas that opened up due to some of the wrap slipping and causing him trouble. He states he did not realize they had slipped. Nonetheless he has a 1 area on the right and 3 new areas on the left. Fortunately there is no signs of active infection at this time which is great news. 04/05/2020 upon evaluation today patient actually appears to be doing quite well in general in regard to his legs currently. Fortunately there is no signs of active infection at this time. No fevers, chills, nausea, vomiting, or diarrhea. He tells me next week that he will actually be seen in the lymphedema clinic on Thursday at 10 AM I see him on Wednesday next week. 04/12/2020 upon evaluation today patient appears to be doing very well with regard to his lower extremities bilaterally. In fact he does not appear to have any open wounds at this point which is good news. Fortunately there is no signs of active infection at this time. No fevers, chills, nausea, vomiting, or diarrhea. 04/19/2020 upon evaluation today patient appears to be doing well with regard to his wounds currently on the bilateral lower extremities. There does not appear to be any signs of active infection at this time. Fortunately there is no  evidence of systemic infection and overall very pleased at this point. Nonetheless after I held him out last week he literally had blisters the next morning already which swelled up with him being right back here in the clinic. Overall I think that he is just not can be able to be discharged with his legs the way they are he is much to volume overloaded as far as fluid is concerned and that was discussed with him today of also discussed this but should try the clinic nurse manager as well as Dr. Leanord Hawking. 04/26/2020 upon evaluation today patient appears to be doing better with regard to his wounds currently. He is making some progress and overall swelling is under good control with the compression wraps. Fortunately there is no evidence of active infection at this time. 05/10/2020 on evaluation today patient appears to be doing overall well in regard to his lower extremities bilaterally. He is Tolerating the compression wraps without complication and with what we are seeing currently I feel like that he is making excellent progress. There is no signs of active infection at this time. 05/24/2020 upon evaluation today patient appears to be doing well in regard to his legs. The swelling is actually quite a bit down compared to where it has been in the past. Fortunately there is no sign of active infection at this time which is also good news. With that being said he does have several wounds on his toes that have opened up at this point. 05/31/2020 upon evaluation today patient appears to be doing well with regard to his legs bilaterally where he really has no significant fluid buildup at this point overall he seems to be doing quite well. Very pleased in this regard. With regard to his toes these also seem to be drying up which is excellent. We have continue to wrap him as every time we tried as a transition to the juxta light wraps things just do not seem to  get any better. 06/07/2020 upon evaluation today  patient appears to be doing well with regard to his right leg at this point. Unfortunately left leg has a lot of blistering he tells me the wrap started to slide down on him when he tried to put his other Velcro wrap over top of it to help keep things in order but nonetheless still had some issues. 06/14/2020 on evaluation today patient appears to be doing well with regard to his lower extremity ulcers and foot ulcers at this point. I feel like everything is actually showing signs of improvement which is great news overall there is no signs of active infection at this time. No fevers, chills, nausea, vomiting, or diarrhea. 06/21/2020 on evaluation today patient actually appears to be doing okay in regard to his wounds in general. With that being said the biggest issue I see is on his right foot in particular the first and second toe seem to be doing a little worse due to the fact this is staying very wet. I think he is probably getting need to change out his dressings a couple times in between each week when we see him in regard to his toes in order to keep this drier based on the location and how this is proceeding. 06/28/2020 on evaluation today patient appears to be doing a little bit more poorly overall in regard to the appearance of the skin I am actually somewhat concerned about the possibility of him having a little bit of an infection here. We discussed the course of potentially giving him a doxycycline prescription which he is taken previously with good result. With that being said I do believe that this is potentially mild and at this point easily fixed. I just do not want anything to get any worse. 07/12/2020 upon evaluation today patient actually appears to be making some progress with regard to his legs which is great news there does not appear to be any evidence of active infection. Overall very pleased with where things stand. 07/26/2020 upon evaluation today patient appears to be doing well  with regard to his leg ulcers and toe ulcers at this point. He has been tolerating the compression wraps without complication overall very pleased in this regard. Objective Constitutional Obese and well-hydrated in no acute distress. Vitals Time Taken: 10:37 AM, Height: 70 in, Weight: 380.2 lbs, BMI: 54.5, Temperature: 98.2 F, Pulse: 75 bpm, Respiratory Rate: 18 breaths/min, Blood Pressure: 157/70 mmHg, Capillary Blood Glucose: 215 mg/dl. General Notes: glucose per pt report Respiratory normal breathing without difficulty. Psychiatric this patient is able to make decisions and demonstrates good insight into disease process. Alert and Oriented x 3. pleasant and cooperative. General Notes: His wound bed currently showed signs of good granulation and epithelization I feel like he is actually making progress things are closing up and I do not see much in the way of anything open at this time which is great news. Overall I think that he is headed in the right direction and the wraps are keeping things under control. Integumentary (Hair, Skin) Wound #180 status is Open. Original cause of wound was Blister. The wound is located on the Left,Proximal,Dorsal Foot. The wound measures 0cm length x 0cm width x 0cm depth; 0cm^2 area and 0cm^3 volume. There is no tunneling or undermining noted. There is a none present amount of drainage noted. The wound margin is flat and intact. There is no granulation within the wound bed. There is no necrotic tissue within the wound  bed. Wound #183 status is Open. Original cause of wound was Blister. The wound is located on the Left T Second. The wound measures 1cm length x 0.8cm width oe x 0.1cm depth; 0.628cm^2 area and 0.063cm^3 volume. There is Fat Layer (Subcutaneous Tissue) exposed. There is no tunneling or undermining noted. There is a small amount of serous drainage noted. The wound margin is flat and intact. There is large (67-100%) pink, pale granulation within  the wound bed. There is no necrotic tissue within the wound bed. Wound #184 status is Open. Original cause of wound was Gradually Appeared. The wound is located on the Left,Anterior Lower Leg. The wound measures 3.5cm length x 2cm width x 0.1cm depth; 5.498cm^2 area and 0.55cm^3 volume. There is Fat Layer (Subcutaneous Tissue) exposed. There is no tunneling or undermining noted. There is a medium amount of serosanguineous drainage noted. The wound margin is flat and intact. There is large (67-100%) pink granulation within the wound bed. There is no necrotic tissue within the wound bed. 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 There was a Four Layer Compression Therapy Procedure by Yevonne Pax, RN. Post procedure Diagnosis Wound #184: Same as Pre-Procedure There was a Radio broadcast assistant Compression Therapy Procedure by Yevonne Pax, RN. Post procedure Diagnosis Wound #: Same as Pre-Procedure Plan Follow-up Appointments: Return Appointment in 2 weeks. - with Leonard Schwartz Nurse Visit: - 1 week Skin Barriers/Peri-Wound Care: Moisturizing lotion - both legs and feet and toes Wound Cleansing: May shower with protection. Primary Wound Dressing: Wound #183 Left T Second: oe Calcium Alginate with Silver - to any blisters or weeping areas Wound #184 Left,Anterior Lower Leg: Calcium Alginate with Silver - to any blisters or weeping areas Secondary Dressing: Wound #183 Left T  Second: oe Kerlix/Rolled Gauze - secure with coban Dry Gauze Wound #184 Left,Anterior Lower Leg: Dry Gauze Edema Control: 4 layer compression: Left lower extremity Unna Boot to Right Lower Extremity Avoid standing for long periods of time Elevate legs to the level of the heart or above for 30 minutes daily and/or when sitting, a frequency of: - throughout the day Exercise regularly 1. I would recommend that we continue with the compression wraps to the lower extremities bilaterally again this is an ongoing thing that we have agreed to just continue doing and keeping things under control here. He still using lymphedema wraps for his upper legs as prescribed and recommended from the lymphedema clinic. 2. Also can recommend he needs to continue to elevate his legs much possible try to keep edema under control. 3. We will use calcium alginate with silver to any open wound areas. We will see patient back for reevaluation in 2 weeks here in the clinic. If anything worsens or changes patient will contact our office for additional recommendations. We will see him in 1 week for nurse visit. Electronic Signature(s) Signed: 07/26/2020 11:24:02 AM By: Lenda Kelp PA-C Entered By: Lenda Kelp on 07/26/2020 11:24:01 -------------------------------------------------------------------------------- SuperBill Details Patient Name: Date of Service: [name], [name] 07/26/2020 Medical Record Number: [id number] Patient Account  Number: [id number] Date of Birth/Sex: Treating RN: [birth year] (69 y.o. [name] Primary Care Provider: Marney Setting, [name] Other Clinician: Referring Provider: Treating Provider/Extender: Lenda Kelp [facility] [name], [name] in Treatment: 235 Diagnosis Coding ICD-10 Codes Code Description [phone] Non-pressure chronic ulcer of right calf limited to breakdown of skin L97.221 Non-pressure chronic ulcer of left calf limited to breakdown of skin I87.333 Chronic  venous hypertension (idiopathic) with ulcer and inflammation of bilateral lower extremity I89.0 Lymphedema, not elsewhere classified E11.622 Type 2 diabetes mellitus with other skin ulcer 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, unspecified L03.116 Cellulitis of left lower limb Facility Procedures CPT4 Code: 21308657 Description: (Facility Use Only) [phone] - APPLY UNNA BOOT RT Modifier: Quantity: 1 CPT4 Code: 52841324 Description: (Facility Use Only) 29581LT - APPLY MULTLAY COMPRS LWR LT LEG Modifier: 59 Quantity: 1 Physician Procedures : CPT4 Code Description Modifier [id number] - WC PHYS LEVEL 3 - EST PT ICD-10 Diagnosis Description 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 skin L97.221 Non-pressure chronic ulcer of left calf limited to breakdown of  skin 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 of bilateral lower extremity I89.0 Lymphedema, not elsewhere classified Quantity: 1 Electronic Signature(s) Signed: 07/26/2020 11:24:22 AM By: Lenda Kelp PA-C Entered By: Lenda Kelp on 07/26/2020 11:24:18

## 2020-08-02 ENCOUNTER — Other Ambulatory Visit: Payer: Self-pay

## 2020-08-02 ENCOUNTER — Encounter (HOSPITAL_BASED_OUTPATIENT_CLINIC_OR_DEPARTMENT_OTHER): Payer: Medicare Other | Admitting: Physician Assistant

## 2020-08-02 DIAGNOSIS — E11622 Type 2 diabetes mellitus with other skin ulcer: Secondary | ICD-10-CM | POA: Diagnosis not present

## 2020-08-02 DIAGNOSIS — L97211 Non-pressure chronic ulcer of right calf limited to breakdown of skin: Secondary | ICD-10-CM | POA: Diagnosis not present

## 2020-08-02 DIAGNOSIS — I890 Lymphedema, not elsewhere classified: Secondary | ICD-10-CM | POA: Diagnosis not present

## 2020-08-02 DIAGNOSIS — I87333 Chronic venous hypertension (idiopathic) with ulcer and inflammation of bilateral lower extremity: Secondary | ICD-10-CM | POA: Diagnosis not present

## 2020-08-02 DIAGNOSIS — E1140 Type 2 diabetes mellitus with diabetic neuropathy, unspecified: Secondary | ICD-10-CM | POA: Diagnosis not present

## 2020-08-02 DIAGNOSIS — L03116 Cellulitis of left lower limb: Secondary | ICD-10-CM | POA: Diagnosis not present

## 2020-08-02 DIAGNOSIS — L97221 Non-pressure chronic ulcer of left calf limited to breakdown of skin: Secondary | ICD-10-CM | POA: Diagnosis not present

## 2020-08-02 NOTE — Progress Notes (Signed)
[name], BAUMBACH ([id number]) Visit Report for 08/02/2020 SuperBill Details Patient Name: Date of Service: [name], [name] 08/02/2020 Medical Record Number: [id number] Patient Account Number: [id number] Date of Birth/Sex: Treating RN: 1951-02-10 (69 y.o. [name], [name] Primary Care Provider: Marney Setting, [name] Other Clinician: Referring Provider: Treating Provider/Extender: Suzy Bouchard, [name] in Treatment: 236 Diagnosis Coding ICD-10 Codes Code Description [phone] Non-pressure chronic ulcer of right calf limited to breakdown of skin L97.221 Non-pressure chronic ulcer of left calf limited to breakdown of skin 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 of bilateral lower extremity I89.0 Lymphedema, not elsewhere classified E11.622 Type 2 diabetes mellitus with other skin ulcer 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, unspecified L03.116 Cellulitis of left lower limb Facility Procedures CPT4 Code Description Modifier Quantity [id number] (Facility Use Only) [phone] - APPLY MULTLAY COMPRS LWR LT LEG 1 [id number] (Facility Use Only) 37628BT - APPLY UNNA BOOT RT 59 1 Electronic Signature(s) Signed: 08/02/2020 5:13:43 PM By: Shawn Stall Signed: 08/02/2020 5:47:16 PM By: Lenda Kelp PA-C Entered By: Shawn Stall on 08/02/2020 12:06:23

## 2020-08-02 NOTE — Progress Notes (Signed)
ZYDEN, SUMAN ([id number]) Visit Report for 08/02/2020 Arrival Information Details Patient Name: Date of Service: CO CARLIN, ATTRIDGE 08/02/2020 10:30 A M Medical Record Number: [id number] Patient Account Number: [id number] Date of Birth/Sex: Treating RN: 09-03-1951 (69 y.o. Bayard Hugger, Bonita Quin Primary Care Cray Monnin: Marney Setting, [name] Other Clinician: Referring Vela Render: Treating Nettie Wyffels/Extender: Lenda Kelp MCGO [name], [name] in Treatment: 236 Visit Information History Since Last Visit Added or deleted any medications: No Patient Arrived: Walker Any new allergies or adverse reactions: No Arrival Time: 11:04 Had a fall or experienced change in No Accompanied By: self activities of daily living that may affect Transfer Assistance: None risk of falls: Patient Identification Verified: Yes Signs or symptoms of abuse/neglect since last visito No Secondary Verification Process Completed: Yes Hospitalized since last visit: No Patient Requires Transmission-Based Precautions: No Implantable device outside of the clinic excluding No Patient Has Alerts: Yes cellular tissue based products placed in the center since last visit: Has Dressing in Place as Prescribed: Yes Pain Present Now: No Electronic Signature(s) Signed: 08/02/2020 1:07:48 PM By: Karl Ito Entered By: Karl Ito on 08/02/2020 11:20:32 -------------------------------------------------------------------------------- Compression Therapy Details Patient Name: Date of Service: [name], Raysean J. 08/02/2020 10:30 A M Medical Record Number: [id number] Patient Account Number: [id number] Date of Birth/Sex: Treating RN: 06-23-1951 (69 y.o. [name] Primary Care Heaven Meeker: Marney Setting, [name] Other Clinician: Referring Kamareon Sciandra: Treating Theodosia Bahena/Extender: Lenda Kelp MCGO [name], [name] in Treatment: 236 Compression Therapy Performed for Wound Assessment: Wound #184 Left,Anterior Lower Leg Performed By:  Clinician Shawn Stall, RN Compression Type: Four Layer Electronic Signature(s) Signed: 08/02/2020 5:13:43 PM By: Shawn Stall Entered By: Shawn Stall on 08/02/2020 12:02:45 -------------------------------------------------------------------------------- Compression Therapy Details Patient Name: Date of Service: [name], Halvor J. 08/02/2020 10:30 A M Medical Record Number: [id number] Patient Account Number: [id number] Date of Birth/Sex: Treating RN: [birth year] (69 y.o. [name] Primary Care Ramiro Pangilinan: Other Clinician: Marney Setting, [name] Referring Yarah Fuente: Treating Jesusa Stenerson/Extender: Lenda Kelp MCGO [name], [name] in Treatment: 236 Compression Therapy Performed for Wound Assessment: NonWound Condition Lymphedema - Right Leg Performed By: Clinician Shawn Stall, RN Compression Type: Henriette Combs Electronic Signature(s) Signed: 08/02/2020 5:13:43 PM By: Shawn Stall Entered By: Shawn Stall on 08/02/2020 12:03:16 -------------------------------------------------------------------------------- Encounter Discharge Information Details Patient Name: Date of Service: [facility], Cayle J. 08/02/2020 10:30 A M Medical Record Number: [id number] Patient Account Number: [id number] Date of Birth/Sex: Treating RN: 1951/01/10 (69 y.o. [name] Primary Care Lynkin Saini: Marney Setting, [name] Other Clinician: Referring Butch Otterson: Treating Shephanie Romas/Extender: Lenda Kelp MCGO [name], [name] in Treatment: 82 Encounter Discharge Information Items Discharge Condition: Stable Ambulatory Status: Walker Discharge Destination: Home Transportation: Private Auto Accompanied By: self Schedule Follow-up Appointment: Yes Clinical Summary of Care: Electronic Signature(s) Signed: 08/02/2020 5:13:43 PM By: Shawn Stall Entered By: Shawn Stall on 08/02/2020 12:06:04 -------------------------------------------------------------------------------- Patient/Caregiver Education  Details Patient Name: Date of Service: [name] 9/29/2021andnbsp10:30 A M Medical Record Number: [id number] Patient Account Number: [id number] Date of Birth/Gender: Treating RN: [birth year] (69 y.o. [name] Primary Care Physician: Marney Setting, [name] Other Clinician: Referring Physician: Treating Physician/Extender: Suzy Bouchard, [name] in Treatment: 236 Education Assessment Education Provided To: Patient Education Topics Provided Venous: Handouts: Managing Venous Disease and Related Ulcers Methods: Explain/Verbal Responses: Reinforcements needed Electronic Signature(s) Signed: 08/02/2020 5:13:43 PM By: Shawn Stall Entered By: Shawn Stall on 08/02/2020 12:05:45 -------------------------------------------------------------------------------- Wound Assessment Details Patient Name: Date of Service: [name], Lamin J. 08/02/2020 10:30 A M Medical  Record Number: [id number] Patient Account Number: [id number] Date of Birth/Sex: Treating RN: 26-Dec-1950 (69 y.o. [name] Primary Care Lethia Donlon: Marney Setting, [name] Other Clinician: Referring Jameire Kouba: Treating Yaziel Brandon/Extender: Lenda Kelp MCGO [name], [name] in Treatment: 236 Wound Status Wound Number: [id number] Primary Etiology: Diabetic Wound/Ulcer of the Lower Extremity Wound Location: Left T Second oe Wound Status: Open Wounding Event: Blister Date Acquired: 07/26/2020 Weeks Of Treatment: 1 Clustered Wound: No Wound Measurements Length: (cm) 1 Width: (cm) 0.8 Depth: (cm) 0.1 Area: (cm) 0.628 Volume: (cm) 0.063 % Reduction in Area: 0% % Reduction in Volume: 0% Wound Description Classification: Grade 2 Treatment Notes Wound #183 (Left Toe Second) 1. Cleanse With Wound Cleanser Soap and water 3. Primary Dressing Applied Calcium Alginate Ag 4. Secondary Dressing Dry Gauze Roll Gauze 5. Secured With Medipore tape 7. Footwear/Offloading device applied Surgical  shoe Electronic Signature(s) Signed: 08/02/2020 1:07:48 PM By: Karl Ito Signed: 08/02/2020 5:36:27 PM By: Zenaida Deed RN, BSN Entered By: Karl Ito on 08/02/2020 11:21:15 -------------------------------------------------------------------------------- Wound Assessment Details Patient Name: Date of Service: [name], Boykin J. 08/02/2020 10:30 A M Medical Record Number: [id number] Patient Account Number: [id number] Date of Birth/Sex: Treating RN: Feb 12, 1951 (68 y.o. [name] Primary Care Letesha Klecker: Other Clinician: Marney Setting, [name] Referring Glendon Dunwoody: Treating Maddax Palinkas/Extender: Lenda Kelp MCGO [name], [name] in Treatment: 236 Wound Status Wound Number: 184 Primary Etiology: Venous Leg Ulcer Wound Location: Left, Anterior Lower Leg Wound Status: Open Wounding Event: Gradually Appeared Date Acquired: 07/26/2020 Weeks Of Treatment: 1 Clustered Wound: Yes Wound Measurements Length: (cm) 3.5 Width: (cm) 2 Depth: (cm) 0.1 Area: (cm) 5.498 Volume: (cm) 0.55 % Reduction in Area: 0% % Reduction in Volume: 0% Wound Description Classification: Full Thickness Without Exposed Support Structu res Treatment Notes Wound #184 (Left, Anterior Lower Leg) 1. Cleanse With Wound Cleanser Soap and water 2. Periwound Care Moisturizing lotion 3. Primary Dressing Applied Calcium Alginate Ag 4. Secondary Dressing ABD Pad 6. Support Layer Applied 4 layer compression wrap 7. Footwear/Offloading device applied Surgical shoe Notes unna boot first layer applied to upper portion of lower leg. stockinette. Electronic Signature(s) Signed: 08/02/2020 1:07:48 PM By: Karl Ito Signed: 08/02/2020 5:36:27 PM By: Zenaida Deed RN, BSN Entered By: Karl Ito on 08/02/2020 11:21:15 -------------------------------------------------------------------------------- Vitals Details Patient Name: Date of Service: [name], Reymundo J. 08/02/2020 10:30 A M Medical  Record Number: [id number] Patient Account Number: [id number] Date of Birth/Sex: Treating RN: 1951/08/02 (69 y.o. [name] Primary Care Jusiah Aguayo: Marney Setting, [name] Other Clinician: Referring Kailee Essman: Treating Kasiyah Platter/Extender: Lenda Kelp MCGO [name], [name] in Treatment: 236 Vital Signs Time Taken: 11:20 Temperature (F): 97.8 Height (in): 70 Pulse (bpm): 69 Weight (lbs): 380.2 Respiratory Rate (breaths/min): 18 Body Mass Index (BMI): 54.5 Blood Pressure (mmHg): 153/67 Reference Range: 80 - 120 mg / dl Electronic Signature(s) Signed: 08/02/2020 1:07:48 PM By: Karl Ito Entered By: Karl Ito on 08/02/2020 11:20:52

## 2020-08-04 DIAGNOSIS — R29898 Other symptoms and signs involving the musculoskeletal system: Secondary | ICD-10-CM | POA: Diagnosis not present

## 2020-08-04 DIAGNOSIS — R531 Weakness: Secondary | ICD-10-CM | POA: Diagnosis not present

## 2020-08-04 DIAGNOSIS — I890 Lymphedema, not elsewhere classified: Secondary | ICD-10-CM | POA: Diagnosis not present

## 2020-08-09 ENCOUNTER — Encounter (HOSPITAL_BASED_OUTPATIENT_CLINIC_OR_DEPARTMENT_OTHER): Payer: Medicare Other | Attending: Physician Assistant | Admitting: Physician Assistant

## 2020-08-09 ENCOUNTER — Ambulatory Visit (INDEPENDENT_AMBULATORY_CARE_PROVIDER_SITE_OTHER): Payer: Medicare Other

## 2020-08-09 ENCOUNTER — Other Ambulatory Visit: Payer: Self-pay

## 2020-08-09 DIAGNOSIS — I890 Lymphedema, not elsewhere classified: Secondary | ICD-10-CM | POA: Insufficient documentation

## 2020-08-09 DIAGNOSIS — L97522 Non-pressure chronic ulcer of other part of left foot with fat layer exposed: Secondary | ICD-10-CM | POA: Diagnosis not present

## 2020-08-09 DIAGNOSIS — L97222 Non-pressure chronic ulcer of left calf with fat layer exposed: Secondary | ICD-10-CM | POA: Insufficient documentation

## 2020-08-09 DIAGNOSIS — L03119 Cellulitis of unspecified part of limb: Secondary | ICD-10-CM | POA: Diagnosis not present

## 2020-08-09 DIAGNOSIS — I872 Venous insufficiency (chronic) (peripheral): Secondary | ICD-10-CM

## 2020-08-09 DIAGNOSIS — E11622 Type 2 diabetes mellitus with other skin ulcer: Secondary | ICD-10-CM | POA: Diagnosis not present

## 2020-08-09 DIAGNOSIS — L03116 Cellulitis of left lower limb: Secondary | ICD-10-CM | POA: Diagnosis not present

## 2020-08-09 DIAGNOSIS — L97211 Non-pressure chronic ulcer of right calf limited to breakdown of skin: Secondary | ICD-10-CM | POA: Insufficient documentation

## 2020-08-09 DIAGNOSIS — E1140 Type 2 diabetes mellitus with diabetic neuropathy, unspecified: Secondary | ICD-10-CM | POA: Diagnosis not present

## 2020-08-09 DIAGNOSIS — I87333 Chronic venous hypertension (idiopathic) with ulcer and inflammation of bilateral lower extremity: Secondary | ICD-10-CM | POA: Insufficient documentation

## 2020-08-09 NOTE — Progress Notes (Signed)
[name] a 68 y.o.malepresents to the office today for bicillininjections, per physician's orders.  Bicillin 600,000 Unitswas administeredbilaterally in Right and Left upper outter quadtoday. Patient tolerated injection.  Patient due for follow up labs/provider appt:Yes. Date due:10/11/20, appt madeYes Next injection due:09/13/20, appt made:yes

## 2020-08-09 NOTE — Progress Notes (Addendum)
[name], [name] ([id number]) Visit Report for 08/09/2020 Chief Complaint Document Details Patient Name: Date of Service: [name], [name] 08/09/2020 10:30 A M Medical Record Number: [id number] Patient Account Number: [id number] Date of Birth/Sex: Treating RN: 06-11-1951 (69 y.o. [name] Primary Care Provider: Nicoletta Ba Other Clinician: Referring Provider: Treating Provider/Extender: Adele Dan in Treatment: [id number] Information Obtained from: Patient Chief Complaint patient is here for evaluation venous/lymphedema weeping Electronic Signature(s) Signed: 08/09/2020 11:18:35 AM By: Lenda Kelp PA-C Entered By: Lenda Kelp on 08/09/2020 11:18:34 -------------------------------------------------------------------------------- HPI Details Patient Name: Date of Service: [name], [name] 08/09/2020 10:30 A M Medical Record Number: [id number] Patient Account Number: [id number] Date of Birth/Sex: Treating RN: 1951/04/15 (69 y.o. [name] Primary Care Provider: Nicoletta Ba Other Clinician: Referring Provider: Treating Provider/Extender: Adele Dan in Treatment: 237 History of Present Illness HPI Description: Referred by PCP for consultation. Patient has long standing history of BLE venous stasis, no prior ulcerations. At beginning of month, developed cellulitis and weeping. Received IM Rocephin followed by Keflex and resolved. Wears compression stocking, appr 6 months old. Not sure strength. No present drainage. 01/22/16 this is a patient who is a type II diabetic on insulin. 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 He tells me he religiously wears pressure stockings of uncertain strength. He was here with weeping edema about 8 months ago but did not have an open wound. Roughly a month ago he had a reopening on his bilateral legs. He is been using bandages and Neosporin. He  does not complain of pain. 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 Last BUN/creatinine I have is from 11/20/15 at 13 and 1.0 respectively 01/29/16; patient arrives today having tolerated the Profore wrap. He brought in his stockings and these are 18 mmHg stockings he bought from [place]. The compression here is likely inadequate. He does not complain of pain or excessive drainage she has no systemic symptoms. The wound on the right looks improved as does the one on the left although one on the left is more substantial with still tissue at risk below the actual wound area on the bilateral posterior calf 02/05/16; patient arrives with poor edema control. He states that we did put a 4 layer compression on it last week. No weight appear 5 this. 02/12/16; the area on the posterior right Has healed. The left Has a substantial wound that has necrotic surface eschar that requires a debridement with a curette. 02/16/16;the patient called or a Nurse visit secondary to increased swelling. He had been in earlier in the week with his right leg healed. He was transitioned to is on pressure stocking on the right leg with the only open wound on the left, a substantial area on the left posterior calf. Note he has a history of severe lower extremity edema, he has a history of chronic atrial fibrillation but not heart failure per my notes but I'll need to research this. He is not complaining of chest pain shortness of breath or orthopnea. The intake nurse noted blisters on the previously closed right leg 02/19/16; this is the patient's regular visit day. I see him on Friday with escalating edema new wounds on the right leg and clear signs of at least right ventricular heart failure. I increased his Lasix to 40 twice a day.  He is returning currently in follow-up. States he is noticed a decrease in that the  edema 02/26/16 patient's legs have much less edema. There is nothing really open on the right leg. The left leg has improved condition of the large superficial wound on the posterior left leg 03/04/16; edema control is very much better. The patient's right leg wounds have healed. On the left leg he continues to have severe venous inflammation on the posterior aspect of the left leg. There is no tenderness and I don't think any of this is cellulitis. 03/11/16; patient's right leg is married healed and he is in his own stocking. The patient's left leg has deteriorated somewhat. There is a lot of erythema around the wound on the posterior left leg. There is also a significant rim of erythema posteriorly just above where the wrap would've ended there is a new wound in this location and a lot of tenderness. Can't rule out cellulitis in this area. 03/15/16; patient's right leg remains healed and he is in his own stocking. The patient's left leg is much better than last review. His major wound on the posterior aspect of his left Is almost fully epithelialized. He has 3 small injuries from the wraps. Really. Erythema seems a lot better on antibiotics 03/18/16; right leg remains healed and he is in his own stocking. The patient's left leg is much better. The area on the posterior aspect of the left calf is fully epithelialized. His 3 small injuries which were wrap injuries on the left are improved only one seems still open his erythema has resolved 03/25/16; patient's right leg remains healed and he is in his own stocking. There is no open area today on the left leg posterior leg is completely closed up. His wrap injuries at the superior aspect of his leg are also resolved. He looks as though he has some irritation on the dorsal ankle but this is fully epithelialized without evidence of infection. 03/28/16; we discharged this patient on Monday. Transitioned him into his own stocking. There were problems almost  immediately with uncontrolled swelling weeping edema multiple some of which have opened. He does not feel systemically unwell in particular no chest pain no shortness of breath and he does not feel 04/08/16; the edema is under better control with the Profore light wrap but he still has pitting edema. There is one large wound anteriorly 2 on the medial aspect of his left leg and 3 small areas on the superior posterior calf. Drainage is not excessive he is tolerating a Profore light well 04/15/16; put a Profore wrap on him last week. This is controlled is edema however he had a lot of pain on his left anterior foot most of his wounds are healed 04/22/16 once again the patient has denuded areas on the left anterior foot which he states are because his wrap slips up word. He saw his primary physician today is on Lasix 40 twice a day and states that he his weight is down 20 pounds over the last 3 months. 04/29/16: Much improved. left anterior foot much improved. He is now on Lasix 80 mg per day. Much improved edema control 05/06/16; I was hoping to be able to discharge him today however once again he has blisters at a low level of where the compression was placed last week mostly on his left lateral but also his left medial leg and a small area on the anterior part of the left foot. 05/09/16; apparently the patient went  home after his appointment on 7/4 later in the evening developing pain in his upper medial thigh together with subjective fever and chills although his temperature was not taken. The pain was so intense he felt he would probably have to call 911. However he then remembered that he had leftover doxycycline from a previous round of antibiotics and took these. By the next morning he felt a lot better. He called and spoke to one of our nurses and I approved doxycycline over the phone thinking that this was in relation to the wounds we had previously seen although they were definitely were not. The  patient feels a lot better old fever no chills he is still working. Blood sugars are reasonably controlled 05/13/16; patient is back in for review of his cellulitis on his anterior medial upper thigh. He is taking doxycycline this is a lot better. Culture I did of the nodular area on the dorsal aspect of his foot grew MRSA this also looks a lot better. 05/20/16; the patient is cellulitis on the medial upper thigh has resolved. All of his wound areas including the left anterior foot, areas on the medial aspect of the left calf and the lateral aspect of the calf at all resolved. He has a new blister on the left dorsal foot at the level of the fourth toe this was excised. No evidence of infection 05/27/16; patient continues to complain weeping edema. He has new blisterlike wounds on the left anterior lateral and posterior lateral calf at the top of his wrap levels. The area on his left anterior foot appears better. He is not complaining of fever, pain or pruritus in his feet. 05/30/16; the patient's blisters on his left anterior leg posterior calf all look improved. He did not increase the Lasix 100 mg as I suggested because he was going to run out of his 40 mg tablets. He is still having weeping edema of his toes 06/03/16; I renewed his Lasix at 80 mg once a day as he was about to run out when I last saw him. He is on 80 mg of Lasix now. I have asked him to cut down on the excessive amount of water he was drinking and asked him to drink according to his thirst mechanisms 06/12/2016 -- was seen 2 days ago and was supposed to wear his compression stockings at home but he is developed lymphedema and superficial blisters on the left lower extremity and hence came in for a review 06/24/16; the remaining wound is on his left anterior leg. He still has edema coming from between his toes. There is lymphedema here however his edema is generally better than when I last saw this. He has a history of atrial fibrillation  but does not have a known history of congestive heart failure nevertheless I think he probably has this at least on a diastolic basis. 07/01/16 I reviewed his echocardiogram from January 2017. This was essentially normal. He did not have LVH, EF of 55-60%. His right ventricular function was normal although he did have trivial tricuspid and pulmonic regurgitation. This is not audible on exam however. I increased his Lasix to do massive edema in his legs well above his knees I think in early July. He was also drinking an excessive amount of water at the time. 07/15/16; missed his appointment last week because of the Labor Day holiday on Monday. He could not get another appointment later in the week. Started to feel the wrap digging in superiorly so  we remove the top half and the bottom half of his wrap. He has extensive erythema and blistering superiorly in the left leg. Very tender. Very swollen. Edema in his foot with leaking edema fluid. He has not been systemically unwell 07/22/16; the area on the left leg laterally required some debridement. The medial wounds look more stable. His wrap injury wounds appear to have healed. Edema and his foot is better, weeping edema is also better. He tells me he is meeting with the supplier of the external compression pumps at work 08/05/16; the patient was on vacation last week in [facility]. His wrap is been on for an extended period of time. Also over the weekend he developed an extensive area of tender erythema across his anterior medial thigh. He took to doxycycline yesterday that he had leftover from a previous prescription. The patient complains of weeping edema coming out of his toes 08/08/16; I saw this patient on 10/2. He was tender across his anterior thigh. I put him on doxycycline. He returns today in follow-up. He does not have any open wounds on his lower leg, he still has edema weeping into his toes. 08/12/16; patient was seen back urgently today to  follow-up for his extensive left thigh cellulitis/erysipelas. He comes back with a lot less swelling and erythema pain is much better. I believe I gave him Augmentin and Cipro. His wrap was cut down as he stated a roll down his legs. He developed blistering above the level of the wrap that remained. He has 2 open blisters and 1 intact. 08/19/16; patient is been doing his primary doctor who is increased his Lasix from 40-80 once a day or 80 already has less edema. Cellulitis has remained improved in the left thigh. 2 open areas on the posterior left calf 08/26/16; he returns today having new open blisters on the anterior part of his left leg. He has his compression pumps but is not yet been shown how to use some vital representative from the supplier. 09/02/16 patient returns today with no open wounds on the left leg. Some maceration in his plantar toes 09/10/2016 -- Dr. Leanord Hawking had recently discharged him on 09/02/2016 and he has come right back with redness swelling and some open ulcers on his left lower extremity. He says this was caused by trying to apply his compression stockings and he's been unable to use this and has not been able to use his lymphedema pumps. He had some doxycycline leftover and he has started on this a few days ago. 09/16/16; there are no open wounds on his leg on the left and no evidence of cellulitis. 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 He does not complain of symptoms here. I am not clear use using his external compression pumps. 09/23/16; I have not seen this patient in 2 weeks. He canceled his appointment 10 days ago as he was going on vacation. He tells me that on Monday he noticed a large area on his posterior left leg which is been draining copiously and is reopened into a large wound. He is been using ABDs and the external part of his juxtalite, according to our nurse this was not on properly. 10/07/16;  Still a substantial area on the posterior left leg. Using silver alginate 10/14/16; in general better although there is still open area which looks healthy. Still using silver alginate. He reminds me that this happen before he left for [facility].  T oday while he was showering in the morning. He had been using his juxtalite's 10/21/16; the area on his posterior left leg is fully epithelialized. However he arrives today with a large area of tender erythema in his medial and posterior left thigh just above the knee. I have marked the area. Once again he is reluctant to consider hospitalization. I treated him with oral antibiotics in the past for a similar situation with resolution I think with doxycycline however this area it seems more extensive to me. He is not complaining of fever but does have chills and says states he is thirsty. His blood sugar today was in the 140s at home 10/25/16 the area on his posterior left leg is fully epithelialized although there is still some weeping edema. The large area of tenderness and erythema in his medial and posterior left thigh is a lot less tender although there is still a lot of swelling in this thigh. He states he feels a lot better. He is on doxycycline and Augmentin that I started last week. This will continued until Tuesday, December 26. I have ordered a duplex ultrasound of the left thigh rule out DVT whether there is an abscess something that would need to be drained I would also like to know. 11/01/16; he still has weeping edema from a not fully epithelialized area on his left posterior calf. Most of the rest of this looks a lot better. He has completed his antibiotics. His thigh is a lot better. Duplex ultrasound did not show a DVT in the thigh 11/08/16; he comes in today with more Denuded surface epithelium from the posterior aspect of his calf. There is no real evidence of cellulitis. The superior aspect of his wrap appears to have put quite an  indentation in his leg just below the knee and this may have contributed. He does not complain of pain or fever. We have been using silver alginate as the primary dressing. The area of cellulitis in the right thigh has totally resolved. He has been using his compression stockings once a week 11/15/16; the patient arrives today with more loss of epithelium from the posterior aspect of his left calf. He now has a fairly substantial wound in this area. The reason behind this deterioration isn't exactly clear although his edema is not well controlled. He states he feels he is generally more swollen systemically. He is not complaining of chest pain shortness of breath fever. T me he has an appointment with his primary physician in early February. He is on 80 mg of oral ells Lasix a day. He claims compliance with the external compression pumps. He is not having any pain in his legs similar to what he has with his recurrent cellulitis 11/22/16; the patient arrives a follow-up of his large area on his left lateral calf. This looks somewhat better today. He came in earlier in the week for a dressing change since I saw him a week ago. He is not complaining of any pain no shortness of breath no chest pain 11/28/16; the patient arrives for follow-up of his large area on the left lateral calf this does not look better. In fact it is larger weeping edema. The surface of the wound does not look too bad. We have been using silver alginate although I'm not certain that this is a dressing issue. 12/05/16; again the patient follows up for a large wound on the left lateral and left posterior calf this does not look better. There continues  to be weeping edema necrotic surface tissue. More worrisome than this once again there is erythema below the wound involving the distal Achilles and heel suggestive of cellulitis. He is on his feet working most of the day of this is not going well. We are changing his dressing twice a week to  facilitate the drainage. 12/12/16; not much change in the overall dimensions of the large area on the left posterior calf. This is very inflamed looking. I gave him an. Doxycycline last week does not really seem to have helped. He found the wrap very painful indeed it seems to of dog into his legs superiorly and perhaps around the heel. He came in early today because the drainage had soaked through his dressings. 12/19/16- patient arrives for follow-up evaluation of his left lower extremity ulcers. He states that he is using his lymphedema pumps once daily when there is "no drainage". He admits to not using his lipedema pumps while under current treatment. His blood sugars have been consistently between 150-200. 12/26/16; the patient is not using his compression pumps at home because of the wetness on his feet. I've advised him that I think it's important for him to use this daily. He finds his feet too wet, he can put a plastic bag over his legs while he is in the pumps. Otherwise I think will be in a vicious circle. We are using silver alginate to the major area on his left posterior calf 01/02/17; the patient's posterior left leg has further of all into 3 open wounds. All of them covered with a necrotic surface. He claims to be using his compression pumps once a day. His edema control is marginal. Continue with silver alginate 01/10/17; the patient's left posterior leg actually looks somewhat better. There is less edema, less erythema. Still has 3 open areas covered with a necrotic surface requiring debridement. He claims to be using his compression pumps once a day his edema control is better 01/17/17; the patient's left posterior calf look better last week when I saw him and his wrap was changed 2 days ago. He has noted increasing pain in the left heel and arrives today with much larger wounds extensive erythema extending down into the entire heel area especially tender medially. He is not  systemically unwell CBGs have been controlled no fever. Our intake nurse showed me limegreen drainage on his AVD pads. 01/24/17; his usual this patient responds nicely to antibiotics last week giving him Levaquin for presumed Pseudomonas. The whole entire posterior part of his leg is much better much less inflamed and in the case of his Achilles heel area much less tender. He has also had some epithelialization posteriorly there are still open areas here and still draining but overall considerably better 01/31/17- He has continue to tolerate the compression wraps. he states that he continues to use the lymphedema pumps daily, and can increase to twice daily on the weekends. He is voicing no complaints or concerns regarding his LLE ulcers 02/07/17-he is here for follow-up evaluation. He states that he noted some erythema to the left medial and anterior thigh, which he states is new as of yesterday. He is concerned about recurrent cellulitis. He states his blood sugars have been slightly elevated, this morning in the 180s 02/14/17; he is here for follow-up evaluation. When he was last here there was erythema superiorly from his posterior wound in his anterior thigh. He was prescribed Levaquin however a culture of the wound surface grew MRSA over the  phone I changed him to doxycycline on Monday and things seem to be a lot better. 02/24/17; patient missed his appointment on Friday therefore we changed his nurse visit into a physician visit today. Still using silver alginate on the large area of the posterior left thigh. He isn't new area on the dorsal left second toe 03/03/17; actually better today although he admits he has not used his external compression pumps in the last 2 days or so because of work responsibilities over the weekend. 03/10/17; continued improvement. External compression pumps once a day almost all of his wounds have closed on the posterior left calf. Better edema control 03/17/17; in general  improved. He still has 3 small open areas on the lateral aspect of his left leg however most of the area on the posterior part of his leg is epithelialized. He has better edema control. He has an ABD pad under his stocking on the right anterior lower leg although he did not let us look at that today. 03/24/17; patient arrives back in clinic today with no open areas however there are areas on the posterior left calf and anterior left calf that are less than 100% epithelialized. His edema is well controlled in the left lower leg. There is some pitting edema probably lymphedema in the left upper thigh. He uses compression pumps at home once per day. I tried to get him to do this twice a day although he is very reticent. 04/01/2017 -- for the last 2 days he's had significant redness, tenderness and weeping and came in for an urgent visit today. 04/07/17; patient still has 6 more days of doxycycline. He was seen by Dr. Meyer Russel last Wednesday for cellulitis involving the posterior aspect, lateral aspect of his Involving his heel. For the most part he is better there is less erythema and less weeping. He has been on his feet for 12 hours 2 over the weekend. Using his compression pumps once a day 04/14/17 arrives today with continued improvement. Only one area on the posterior left calf that is not fully epithelialized. He has intense bilateral venous inflammation associated with his chronic venous insufficiency disease and secondary lymphedema. We have been using silver alginate to the left posterior calf wound In passing he tells [place] today that the right leg but we have not seen in quite some time has an open area on it but he doesn't want [place] to look at this today states he will show this to [place] next week. 04/21/17; there is no open area on his left leg although he still reports some weeping edema. He showed [place] his right leg today which is the first time we've seen this leg in a long time. He has a large area of  open wound on the right leg anteriorly healthy granulation. Quite a bit of swelling in the right leg and some degree of venous inflammation. He told us about the right leg in passing last week but states that deterioration in the right leg really only happened over the weekend 04/28/17; there is no open area on the left leg although there is an irritated part on the posterior which is like a wrap injury. The wound on the right leg which was new from last week at least to [place] is a lot better. 05/05/17; still no open area on the left leg. Patient is using his new compression stocking which seems to be doing a good job of controlling the edema. He states he is using his compression  pumps once per day. The right leg still has an open wound although it is better in terms of surface area. Required debridement. A lot of pain in the posterior right Achilles marked tenderness. Usually this type of presentation this patient gives concern for an active cellulitis 05/12/17; patient arrives today with his major wound from last week on the right lateral leg somewhat better. Still requiring debridement. He was using his compression stocking on the left leg however that is reopened with superficial wounds anteriorly he did not have an open wound on this leg previously. He is still using his juxta light's once daily at night. He cannot find the time to do this in the morning as he has to be at work by 7 AM 05/19/17; right lateral leg wound looks improved. No debridement required. The concerning area is on the left posterior leg which appears to almost have a subcutaneous hemorrhagic component to it. We've been using silver alginate to all the wounds 05/26/17; the right lateral leg wound continues to look improved. However the area on the left posterior calf is a tightly adherent surface. [place] using silver alginate. Because of the weeping edema in his legs there is very little good alternatives. 06/02/17; the patient left  here last week looking quite good. Major wound on the left posterior calf and a small one on the right lateral calf. Both of these look satisfactory. He tells me that by Wednesday he had noted increased pain in the left leg and drainage. He called on Thursday and Friday to get an appointment here but we were blocked. He did not go to urgent care or his primary physician. He thinks he had a fever on Thursday but did not actually take his temperature. He has not been using his compression pumps on the left leg because of pain. I advised him to go to the emergency room today for IV antibiotics for stents of left leg cellulitis but he has refused I have asked him to take 2 days off work to keep his leg elevated and he has refused this as well. In view of this I'm going to call him and Augmentin and doxycycline. 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 However after a detailed discussion regarding the risk benefits and alternatives he refuses to get admitted to the hospital. With no other recourse I will continue him on oral antibiotics as before and hopefully he'll have his infectious disease consultation this week. 06/16/2017 -- the patient was seen today by the nurse practitioner at infectious disease Ms. Dixon. 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 He has also been advise Lotrimin cream locally. she also had wise IV antibiotics for his condition if it flares up 06/23/17; patient arrives today with drainage bilaterally although the remaining wound on the left posterior calf after  cleaning up today "highlighter yellow drainage" did not look too bad. Unfortunately he has had breakdown on the right anterior leg [previously this leg had not been open and he is using a black stocking] he went to see infectious disease  and is been put on clindamycin 150 mg daily, I did not verify the dose although I'm not familiar with using clindamycin in this dosing range, perhaps for prophylaxisoo 06/27/17; I brought this patient back today to follow-up on the wound deterioration on the right lower leg together with surrounding cellulitis. I started him on doxycycline 4 days ago. This area looks better however he comes in today with intense cellulitis on the medial part of his left thigh. This is not have a wound in this area. Extremely tender. We've been using silver alginate to the wounds on the right lower leg left lower leg with bilateral 4 layer compression he is using his external compression pumps once a day 07/04/17; patient's left medial thigh cellulitis looks better. He has not been using his compression pumps as his insert said it was contraindicated with cellulitis. His right leg continues to make improvements all the wounds are still open. We only have one remaining wound on the left posterior calf. Using silver alginate to all open areas. He is on doxycycline which I started a week ago and should be finishing I gave him Augmentin after Thursday's visit for the severe cellulitis on the left medial thigh which fortunately looks better 07/14/17; the patient's left medial thigh cellulitis has resolved. The cellulitis in his right lower calf on the right also looks better. All of his wounds are stable to improved we've been using silver alginate he has completed the antibiotics I have given him. He has clindamycin 150 mg once a day prescribed by infectious disease for prophylaxis, I've advised him to start this now. We have been using bilateral Unna boots over silver alginate to the wound  areas 07/21/17; the patient is been to see infectious disease who noted his recurrent problems with cellulitis. He was not able to tolerate prophylactic clindamycin therefore he is on amoxicillin 500 twice a day. He also had a second daily dose of Lasix added By Dr. Oneta Rack but he is not taking this. Nor is he being completely compliant with his compression pumps a especially not this week. He has 2 remaining wounds one on the right posterior lateral lower leg and one on the left posterior medial lower leg. 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 The patient has Unna boots bilaterally. 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 He has bilateral Unna boots for compression and silver alginate to his wounds. Arrives today with his legs looking as good as I have seen him in quite some time. Not surprisingly his wounds look better as well with improvement on the right lateral leg venous insufficiency wound and also the left medial leg. He is still using the compression pumps once a day 08/11/17; both legs appear to be doing better wounds on the right lateral and left medial legs look better. Skin on the right leg quite good. He is been using silver alginate as the primary dressing. I'm going to use Anasept gel calcium alginate and maintain all the secondary dressings 08/18/17; the patient continues to actually do quite well. The area on his right lateral leg is just about closed the left medial also looks better although it is still moist in this area. His edema is well controlled we have been using Anasept gel with calcium alginate and  the usual secondary dressings, 4 layer compression and once daily use of his compression pumps "always been able to manage 09/01/17; the patient  continues to do reasonably well in spite of his trip to [place]. The area on the right lateral leg is epithelialized. Left is much better but still open. He has more edema and more chronic erythema on the left leg [venous inflammation] 09/08/17; he arrives today with no open wound on the right lateral leg and decently controlled edema. Unfortunately his left leg is not nearly as in his good situation as last week.he apparently had increasing edema starting on Saturday. He edema soaked through into his foot so used a plastic bag to walk around his home. The area on the medial right leg which was his open area is about the same however he has lost surface epithelium on the left lateral which is new and he has significant pain in the Achilles area of the left foot. He is already on amoxicillin chronically for prophylaxis of cellulitis in the left leg 09/15/17; he is completed a week of doxycycline and the cellulitis in the left posterior leg and Achilles area is as usual improved. He still has a lot of edema and fluid soaking through his dressings. There is no open wound on the right leg. He saw infectious disease NP today 09/22/17;As usual 1 we transition him from our compression wraps to his stockings things did not go well. He has several small open areas on the right leg. He states this was caused by the compression wrap on his skin although he did not wear this with the stockings over them. He has several superficial areas on the left leg medially laterally posteriorly. He does not have any evidence of active cellulitis especially involving the left Achilles The patient is traveling from [facility] Saturday going to [facility]. He states he isn't attempting to get an appointment with a heel objects wound center there to change his dressings. I am not completely certain whether this will work 10/06/17; the patient came in on Friday for a nurse visit and the nurse reported that his legs actually look  quite good. He arrives in clinic today for his regular follow-up visit. He has a new wound on his left third toe over the PIP probably caused by friction with his footwear. He has small areas on the left leg and a very superficial but epithelialized area on the right anterior lateral lower leg. Other than that his legs look as good as I've seen him in quite some time. We have been using silver alginate Review of systems; no chest pain no shortness of breath other than this a 10 point review of systems negative 10/20/17; seen by Dr. Meyer Russel last week. He had taken some antibiotics [doxycycline] that he had left over. Dr. Meyer Russel thought he had candida infection and declined to give him further antibiotics. He has a small wound remaining on the right lateral leg several areas on the left leg including a larger area on the left posterior several left medial and anterior and a small wound on the left lateral. The area on the left dorsal third toe looks a lot better. ROS; Gen.; no fever, respiratory no cough no sputum Cardiac no chest pain other than this 10 point review of system is negative 10/30/17; patient arrives today having fallen in the bathtub 3 days ago. It took him a while to get up. He has pain and maceration in the wounds on his left  leg which have deteriorated. He has not been using his pumps he also has some maceration on the right lateral leg. 11/03/17; patient continues to have weeping edema especially in the left leg. This saturates his dressings which were just put on on 12/27. As usual the doxycycline seems to take care of the cellulitis on his lower leg. He is not complaining of fever, chills, or other systemic symptoms. He states his leg feels a lot better on the doxycycline I gave him empirically. He also apparently gets injections at his primary doctor's officeo Rocephin for cellulitis prophylaxis. I didn't ask him about his compression pump compliance today I think that's probably  marginal. Arrives in the clinic with all of his dressings primary and secondary macerated full of fluid and he has bilateral edema 11/10/17; the patient's right leg looks some better although there is still a cluster of wounds on the right lateral. The left leg is inflamed with almost circumferential skin loss medially to laterally although we are still maintaining anteriorly. He does not have overt cellulitis there is a lot of drainage. He is not using compression pumps. We have been using silver alginate to the wound areas, there are not a lot of options here 11/17/17; the patient's right leg continues to be stable although there is still open wounds, better than last week. The inflammation in the left leg is better. Still loss of surface layer epithelium especially posteriorly. There is no overt cellulitis in the amount of edema and his left leg is really quite good, tells me he is using his compression pumps once a day. 11/24/17; patient's right leg has a small superficial wound laterally this continues to improve. The inflammation in the left leg is still improving however we have continuous surface layer epithelial loss posteriorly. There is no overt cellulitis in the amount of edema in both legs is really quite good. He states he is using his compression pumps on the left leg once a day for 5 out of 7 days 12/01/17; very small superficial areas on the right lateral leg continue to improve. Edema control in both legs is better today. He has continued loss of surface epithelialization and left posterior calf although I think this is better. We have been using silver alginate with large number of absorptive secondary dressings 4 layer on the left Unna boot on the right at his request. He tells me he is using his compression pumps once a day 12/08/17; he has no open area on the right leg is edema control is good here. On the left leg however he has marked erythema and tenderness breakdown of skin. He has  what appears to be a wrap injury just distal to the popliteal fossa. 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 There was swelling erythema and breakdown in the right leg. He has much worse skin breakdown in the left leg as well multiple open areas medially and posteriorly as well as laterally. He tells me he has been using his compression pumps but tells me he feels that the drainage out of his leg is worse when he uses a compression pumps. T be fair to him he is been saying this o for a while however I don't  know that I have really been listening to this. I wonder if the compression pumps are working properly 12/22/17;. Once again he arrives with severe erythema, weeping edema from the left greater than right leg. Noncompliance with compression pumps. New this visit he is complaining of pain on the lateral aspect of the right leg and the medial aspect of his right thigh. He apparently saw his cardiologist Dr. [name] who was ordered an echocardiogram area and I think this is a step in the right direction 12/25/17; started his doxycycline Monday night. There is still intense erythema of the right leg especially in the anterior thigh although there is less tenderness. The erythema around the wound on the right lateral calf also is less tender. He still complaining of pain in the left heel. His wounds are about the same right lateral left medial left lateral. Superficial but certainly not close to closure. He denies being systemically unwell no fever chills no abdominal pain no diarrhea 12/29/17; back in follow-up of his extensive right calf and right thigh cellulitis. I added amoxicillin to cover possible doxycycline resistant  strep. This seems to of done the trick he is in much less pain there is much less erythema and swelling. He has his echocardiogram at 11:00 this morning. X-ray of the left heel was also negative. 01/05/18; the patient arrived with his edema under much better control. Now that he is retired he is able to use his compression pumps daily and sometimes twice a day per the patient. He has a wound on the right leg the lateral wound looks better. Area on the left leg also looks a lot better. He has no evidence of cellulitis in his bilateral thighs I had a quick peak at his echocardiogram. He is in normal ejection fraction and normal left ventricular function. He has moderate pulmonary hypertension moderately reduced right ventricular function. One would have to wonder about chronic sleep apnea although he says he doesn't snore. He'll review the echocardiogram with his cardiologist. 01/12/18; the patient arrives with the edema in both legs under exemplary control. He is using his compression pumps daily and sometimes twice daily. His wound on the right lateral leg is just about closed. He still has some weeping areas on the posterior left calf and lateral left calf although everything is just about closed here as well. I have spoken with Aldean Baker who is the patient's nurse practitioner and infectious disease. She was concerned that the patient had not understood that the parenteral penicillin injections he was receiving for cellulitis prophylaxis was actually benefiting him. I don't think the patient actually saw that I would tend to agree we were certainly dealing with less infections although he had a serious one last month. 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 He is healed. He'll be placed in juxtalite compression wraps and increase his lymphedema pumps to twice daily. We will follow up again next week to ensure there are no issues with the new  regiment. 01/20/18-he is here for evaluation of bilateral lower extremity weeping edema. Yesterday he was placed in compression wrap to the right lower extremity and compression stocking to left lower shrubbery. He states he uses lymphedema pumps last night and again this morning and noted a blister to the left lower extremity. On exam he was noted to have drainage to the right lower extremity. He will be placed in Unna boots bilaterally and follow-up next week 01/26/18; patient was actually discharged a week  ago to his own juxta light stockings only to return the next day with bilateral lower extremity weeping edema.he was placed in bilateral Unna boots. He arrives today with pain in the back of his left leg. There is no open area on the right leg however there is a linear/wrap injury on the left leg and weeping edema on the left leg posteriorly. I spoke with infectious disease about 10 days ago. They were disappointed that the patient elected to discontinue prophylactic intramuscular penicillin shots as they felt it was particularly beneficial in reducing the frequency of his cellulitis. I discussed this with the patient today. He does not share this view. He'll definitely need antibiotics today. Finally [place] and returning a week later and he does not travel with his pumps. He is going by car 01/30/18; patient was seen 4 days ago and brought back in today for review of cellulitis in the left leg posteriorly. I put him on amoxicillin this really hasn't helped as much as I might like. He is also worried because [place]. Finally we will be rewrapping him. There is no open area on the right leg over his left leg has multiple weeping areas as usual 02/09/18; The same wrap on for 10 days. He did not pick up the last doxycycline I prescribed for him. He apparently took 4 days worth he already had. There is nothing open on  his right leg and the edema control is really quite good. He's had damage in the left leg medially and laterally especially probably related to the prolonged use of Unna boots 02/12/18; the patient arrived in clinic today for a nurse visit/wrap change. He complained of a lot of pain in the left posterior calf. He is taking doxycycline that I previously prescribed for him. Unfortunately even though he used his stockings and apparently used to compression pumps twice a day he has weeping edema coming out of the lateral part of his right leg. This is coming from the lower anterior lateral skin area. 02/16/18; the patient has finished his doxycycline and will finish the amoxicillin 2 days. The area of cellulitis in the left calf posteriorly has resolved. He is no longer having any pain. He tells me he is using his compression pumps at least once a day sometimes twice. 02/23/18; the patient finished his doxycycline and Amoxil last week. On Friday he noticed a small erythematous circle about the size of a quarter on the left lower leg just above his ankle. This rapidly expanded and he now has erythema on the lateral and posterior part of the thigh. This is bright red. Also has an area on the dorsal foot just above his toes and a tender area just below the left popliteal fossa. He came off his prophylactic penicillin injections at his own insistence one or 2 months ago. This is obviously deteriorated since then 03/02/18; patient is on doxycycline and Amoxil. Culture I did last week of the weeping area on the back of his left calf grew group B strep. I have therefore renewed the amoxicillin 500 3 times a day for a further week. He has not been systemically unwell. Still complaining of an area of discomfort right under his left popliteal fossa. There is no open wound on the right leg. He tells me that he is using his pumps twice a day on most days 03/09/18; patient arrives in clinic today completing his  amoxicillin  today. The cellulitis on his left leg is better. Furthermore he tells me that he had intramuscular penicillin shots that his primary care office today. However he also states that the wrap on his right leg fell down shortly after leaving clinic last week. He developed a large blister that was present when he came in for a nurse visit later in the week and then he developed intense discomfort around this area.He tells me he is using his compression pumps 03/16/18; the patient has completed his doxycycline. The infectious part of this/cellulitis in the left heel area left popliteal area is a lot better. He has 2 open areas on the right calf. Still areas on the left calf but this is a lot better as well. 03/24/18; the patient arrives complaining of pain in the left popliteal area again. He thinks some of this is wrap injury. He has no open area on the right leg and really no open area on the left calf either except for the popliteal area. He claims to be compliant with the compression pumps 03/31/18; I gave him doxycycline last week because of cellulitis in the left popliteal area. This is a lot better although the surface epithelium is denuded off and response to this. He arrives today with uncontrolled edema in the right calf area as well as a fingernail injury in the right lateral calf. There is only a few open areas on the left 04/06/18; I gave him amoxicillin doxycycline over the last 2 weeks that the amoxicillin should be completing currently. He is not complaining of any pain or systemic symptoms. The only open areas see has is on the right lateral lower leg paradoxically I cannot see anything on the left lower leg. He tells me he is using his compression pumps twice a day on most days. Silver alginate to the wounds that are open under 4 layer compression 04/13/18; he completed antibiotics and has no new complaints. Using his compression pumps. Silver alginate that anything that's opened 04/20/18; he is  using his compression pumps religiously. Silver alginate 4 layer compression anything that's opened. He comes in today with no open wounds on the left leg but 3 on the right including a new one posteriorly. He has 2 on the right lateral and one on the right posterior. He likes Unna boots on the right leg for reasons that aren't really clear we had the usual 4 layer compression on the left. It may be necessary to move to the 4 layer compression on the right however for now I left them in the Unna boots 04/27/18; he is using his compression pumps at least once a day. He has still the wounds on the right lateral calf. The area right posteriorly has closed. He does not have an open wound on the left under 4 layer compression however on the dorsal left foot just proximal to the toes and the left third toe 2 small open areas were identified 05/11/18; he has not uses compression pumps. The areas on the right lateral calf have coalesced into one large wound necrotic surface. On the left side he has one small wound anteriorly however the edema is now weeping out of a large part of his left leg. He says he wasn't using his pumps because of the weeping fluid. I explained to him that this is the time he needs to pump more 05/18/18; patient states he is using his compression pumps twice a day. The area on the right lateral large  wound albeit superficial. On the left side he has innumerable number of small new wounds on the left calf particularly laterally but several anteriorly and medially. All these appear to have healthy granulated base these look like the remnants of blisters however they occurred under compression. The patient arrives in clinic today with his legs somewhat better. There is certainly less edema, less multiple open areas on the left calf and the right anterior leg looks somewhat better as well superficial and a little smaller. However he relates pain and erythema over the last 3-4 days in the thigh  and I looked at this today. He has not been systemically unwell no fever no chills no change in blood sugar values 05/25/18; comes in today in a better state. The severe cellulitis on his left leg seems better with the Keflex. Not as tender. He has not been systemically unwell Hard to find an open wound on the left lower leg using his compression pumps twice a day The confluent wounds on his right lateral calf somewhat better looking. These will ultimately need debridement I didn't do this today. 06/01/18; the severe cellulitis on the left anterior thigh has resolved and he is completed his Keflex. There is no open wound on the left leg however there is a superficial excoriation at the base of the third toe dorsally. Skin on the bottom of his left foot is macerated looking. The left the wounds on the lateral right leg actually looks some better although he did require debridement of the top half of this wound area with an open curet 06/09/18 on evaluation today patient appears to be doing poorly in regard to his right lower extremity in particular this appears to likely be infected he has very thick purulent discharge along with a bright green tent to the discharge. This makes me concerned about the possibility of pseudomonas. He's also having increased discomfort at this point on evaluation. Fortunately there does not appear to be any evidence of infection spreading to the other location at this time. 06/16/18 on evaluation today patient appears to actually be doing fairly well. His ulcer has actually diminished in size quite significantly at this point which is good news. Nonetheless he still does have some evidence of infection he did see infectious disease this morning before coming here for his appointment. I did review the results of their evaluation and their note today. They did actually have him discontinue the Cipro and initiate treatment with linezolid at this time. He is doing this for the  next seven days and they recommended a follow-up in four months with them. He is the keep a log of the need for intermittent antibiotic therapy between now and when he falls back up with infectious disease. This will help them gaze what exactly they need to do to try and help them out. 06/23/18; the patient arrives today with no open wounds on the left leg and left third toe healed. He is been using his compression pumps twice a day. On the right lateral leg he still has a sizable wound but this is a lot better than last time I saw this. 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 Has been using silver alginate under 4 layer compression 06/30/18; the only open wound he has is on the right lateral leg and this looks healthy. No debridement is required. We have been using silver alginate.  He does not have an open wound on the left leg. There is apparently some drainage from the dorsal proximal third toe on the left although I see no open wound here. 07/03/18 on evaluation today patient was actually here just for a nurse visit rapid change. However when he was here on Wednesday for his rat change due to having been healed on the left and then developing blisters we initiated the wrap again knowing that he would be back today for [place] to reevaluate and see were at. Unfortunately he has developed some cellulitis into the proximal portion of his right lower extremity even into the region of his thigh. 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 He was placed on one as what at that point. Nonetheless he is done with that and has been tolerating it well otherwise. Doxycycline which in the past really did not seem to be effective for him. Nonetheless I think the best option may be for [place] to definitely reinitiate the antibiotics for a longer period of time. 07/07/18; since I  last saw this patient a week ago he has had a difficult time. At that point he did not have an open wound on his left leg. We transitioned him into juxta light stockings. He was apparently in the clinic the next day with blisters on the left lateral and left medial lower calf. He also had weeping edema fluid. He was put back into a compression wrap. He was also in the clinic on Friday with intense erythema in his right thigh. Per the patient he was started on Bactrim however that didn't work at all in terms of relieving his pain and swelling. He has taken 3 doxycycline that he had left over from last time and that seems to of helped. He has blistering on the right thigh as well. 07/14/18; the erythema on his right thigh has gotten better with doxycycline that he is finishing. 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 Presumably this cellulitis in the thigh was not related to that although I think this is a bit concerning going forward. He still has an area on the right lateral calf the blister on the right medial calf just below the knee that was discussed above. On the left 2 small open areas left medial and left lateral. Edema control is adequate. He is using his compression pumps twice a day 07/20/18; continued improvement in the condition of both legs especially the edema in his bilateral thighs. He tells me he is been losing weight through a combination of diet and exercise. He is using his compression pumps twice a day. So overall she made to the remaining wounds 07/27/2018; continued improvement in condition of both legs. His edema is well controlled. The area on the right lateral leg is just about closed he had one blisters show up on the medial left upper calf. We have him in 4 layer compression. He is going on a 10-day trip to [place]. He will be driving. He wants to wear Unna boots because of  the lessening amount of constriction. He will not use compression pumps while he is away 08/05/18 on evaluation today patient actually appears to be doing decently well all things considered in regard to his bilateral lower extremities. The worst ulcer is actually only posterior aspect of his left lower extremity  with a four layer compression wrap cut into his leg a couple weeks back. He did have a trip and actually had [employer] for the trip that he is worn since he was last here. Nonetheless he feels like the [employer] actually do better for him his swelling is up a little bit but he also with his trip was not taking his Lasix on a regular set schedule like he was supposed to be. He states that obviously the reason being that he cannot drive and keep going without having to urinate too frequently which makes it difficult. He did not have his pumps with him while he was away either which I think also maybe playing a role here too. 08/13/2018; the patient only has a small open wound on the right lateral calf which is a big improvement in the last month or 2. He also has the area posteriorly just below the posterior fossa on the left which I think was a wrap injury from several weeks ago. He has no current evidence of cellulitis. He tells me he is back into his compression pumps twice a day. He also tells me that while he was at the laundromat somebody stole a section of his extremitease stockings 08/20/2018; back in the clinic with a much improved state. He only has small areas on the right lateral mid calf which is just about healed. This was is more substantial area for quite a prolonged period of time. He has a small open area on the left anterior tibia. The area on the posterior calf just below the popliteal fossa is closed today. He is using his compression pumps twice a day 08/28/2018; patient has no open wound on the right leg. He has a smattering of open areas on the calf with some  weeping lymphedema. More problematically than that it looks as though his wraps of slipped down in his usual he has very angry upper area of edema just below the right medial knee and on the right lateral calf. He has no open area on his feet. The patient is traveling to [facility] next week. I will send him in an antibiotic. We will continue to wrap the right leg. We ordered extremitease stockings for him last week and I plan to transition the right leg to a stocking when he gets home which will be in 10 days time. As usual he is very reluctant to take his pumps with him when he travels 09/07/2018; patient returns from [facility]. He shows me a picture of his left leg in the mid part of his trip last week with intense fire engine erythema. The picture look bad enough I would have considered sending him to the hospital. Instead he went to the wound care center in [facility]. They did not prescribe him antibiotics but he did take some doxycycline he had leftover from a previous visit. I had given him trimethoprim sulfamethoxazole before he left this did not work according to the patient. This is resulted in some improvement fortunately. He comes back with a large wound on the left posterior calf. Smaller area on the left anterior tibia. Denuded blisters on the dorsal left foot over his toes. Does not have much in the way of wounds on the right leg although he does have a very tender area on the right posterior area just below the popliteal fossa also suggestive of infection. He promises me he is back on his pumps twice a day  09/15/2018; the intense cellulitis in his left lower calf is a lot better. The wound area on the posterior left calf is also so better. However he has reasonably extensive wounds on the dorsal aspect of his second and third toes and the proximal foot just at the base of the toes. There is nothing open on the right leg 09/22/2018; the patient has excellent edema  control in his legs bilaterally. He is using his external compression pumps twice a day. He has no open area on the right leg and only the areas in the left foot dorsally second and third toe area on the left side. He does not have any signs of active cellulitis. 10/06/2018; the patient has good edema control bilaterally. He has no open wound on the right leg. There is a blister in the posterior aspect of his left calf that we had to deal with today. He is using his compression pumps twice a day. There is no signs of active cellulitis. We have been using silver alginate to the wound areas. He still has vulnerable areas on the base of his left first second toes dorsally He has a his extremities stockings and we are going to transition him today into the stocking on the right leg. He is cautioned that he will need to continue to use the compression pumps twice a day. If he notices uncontrolled edema in the right leg he may need to go to 3 times a day. 10/13/2018; the patient came in for a nurse check on Friday he has a large flaccid blister on the right medial calf just below the knee. We unroofed this. He has this and a new area underneath the posterior mid calf which was undoubtedly a blister as well. He also has several small areas on the right which is the area we put his extremities stocking on. 10/19/2018; the patient went to see infectious disease this morning I am not sure if that was a routine follow-up in any case the doxycycline I had given him was discontinued and started on linezolid. He has not started this. It is easy to look at his left calf and the inflammation and think this is cellulitis however he is very tender in the tissue just below the popliteal fossa and I have no doubt that there is infection going on here. He states the problem he is having is that with the compression pumps the edema goes down and then starts walking the wrap falls down. We will see if we can adhere this. He  has 1 or 2 minuscule open areas on the right still areas that are weeping on the posterior left calf, the base of his left second and third toes 10/26/18; back today in clinic with quite of skin breakdown in his left anterior leg. This may have been infection the area below the popliteal fossa seems a lot better however tremendous epithelial loss on the left anterior mid tibia area over quite inexpensive tissue. He has 2 blisters on the right side but no other open wound here. 10/29/2018; came in urgently to see [place] today and we worked him in for review. He states that the 4 layer compression on the right leg caused pain he had to cut it down to roughly his mid calf this caused swelling above the wrap and he has blisters and skin breakdown today. As a result of the pain he has not been using his pumps. Both legs are a lot more edematous and there is  a lot of weeping fluid. 11/02/18; arrives in clinic with continued difficulties in the right leg> left. Leg is swollen and painful. multiple skin blisters and new open areas especially laterally. He has not been using his pumps on the right leg. 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 He is not systemically unwell. 11/09/2018; the patient claims he is being compliant with his pumps. He is finished the doxycycline I gave him last week. Culture I did of the wound on the right lateral leg showed a few very resistant methicillin staph aureus. This was resistant to doxycycline. Nevertheless he states the pain in the leg is a lot better which makes me wonder if the cultured organism was not really what was causing the problem nevertheless this is a very dangerous organism to be culturing out of any wound. His right leg is still a lot larger than the left. He is using an Radio broadcast assistant on this area, he blames a 4-layer compression for causing the original skin breakdown which I doubt is true however I cannot talk him out  of it. We have been using silver alginate to all of these areas which were initially blisters 11/16/2018; patient is being compliant with his external compression pumps at twice a day. Miraculously he arrives in clinic today with absolutely no open wounds. He has better edema control on the left where he has been using 4 layer compression versus wound of wounds on the right and I pointed this out to him. There is no inflammation in the skin in his lower legs which is also somewhat unusual for him. There is no open wounds on the dorsal left foot. He has extremitease stockings at home and I have asked him to bring these in next week. 11/25/18 patient's lower extremity on examination today on the left appears for the most part to be wound free. He does have an open wound on the lateral aspect of the right lower extremity but this is minimal compared to what I've seen in past. He does request that we go ahead and wrap the left leg as well even though there's nothing open just so hopefully it will not reopen in short order. 1/28; patient has superficial open wounds on the right lateral calf left anterior calf and left posterior calf. His edema control is adequate. 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 We have been using silver alginate as the primary dressing. He claims compliance with his compression pumps 2/4; patient has superficial open wounds on numerous areas of his left calf and again one on the left dorsal foot. The areas on the right lateral calf have healed. The cellulitis that I gave him doxycycline for last week is also resolved this was mostly on the left anterior calf just below the tibial tuberosity. His edema looks fairly well-controlled. He tells me he went to see his primary doctor today and had blood work ordered 2/11; once again he has several open areas on the left calf left tibial area. Most of  these are small and appear to have healthy granulation. He does not have anything open on the right. The edema and control in his thighs is pretty good which is usually a good indication he has been using his pumps as requested. 2/18; he continues to have several small areas on the left calf and left tibial area. Most of these  are small healthy granulation. We put him in his stocking on the right leg last week and he arrives with a superficial open area over the right upper tibia and a fairly large area on the right lateral tibia in similar condition. His edema control actually does not look too bad, he claims to be using his compression pumps twice a day 2/25. Continued small areas on the left calf and left tibial area. New areas especially on the right are identified just below the tibial tuberosity and on the right upper tibia itself. There are also areas of weeping edema fluid even without an obvious wound. He does not have a considerable degree of lymphedema but clearly there is more edema here than his skin can handle. He states he is using the pumps twice a day. We have an Unna boot on the right and 4 layer compression on the left. 3/3; he continues to have an area on the right lateral calf and right posterior calf just below the popliteal fossa. There is a fair amount of tenderness around the wound on the popliteal fossa but I did not see any evidence of cellulitis, could just be that the wrap came down and rubbed in this area. He does not have an open area on the left leg however there is an area on the left dorsal foot at the base of the third toe We have been using silver alginate to all wound areas 3/10; he did not have an open area on his left leg last time he was here a week ago. T oday he arrives with a horizontal wound just below the tibial tuberosity and an area on the left lateral calf. He has intense erythema and tenderness in this area. The area is on the right lateral calf and  right posterior calf better than last week. We have been using silver alginate as usual 3/18 - Patient returns with 3 small open areas on left calf, and 1 small open area on right calf, the skin looks ok with no significant erythema, he continues the UNA boot on right and 4 layer compression on left. The right lateral calf wound is closed , the right posterior is small area. we will continue silver alginate to the areas. 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 He is been tolerating the dressing changes without complication. Fortunately she has made excellent improvement in regard to the overall status of his wounds. Unfortunately every time we cease wrapping him he ends up reopening in causing more significant issues at that point. Again I'm unsure of the best direction to take although I think the lymphedema clinic may be appropriate for him. 02/03/19 on evaluation today patient appears to be doing well in regard to the wounds that we saw him for last week unfortunately he has a new area on the proximal portion of his right medial/posterior lower extremity where the wrap somewhat slowed down and caused swelling and a blister to rub and open. Unfortunately this is the only opening that he has on either leg at this point. 02/17/19 on evaluation today patient's bilateral lower extremities appear to be doing well. He still completely healed in regard to the left lower extremity. In regard to the right lower extremity the area where the wrap and slid down and caused the blister  still seems to be slightly open although this is dramatically better than during the last evaluation two weeks ago. I'm very pleased with the way this stands overall. 03/03/19 on evaluation today patient appears to be doing well in regard  to his right lower extremity in general although he did have a new blister open this does not appear to be showing any evidence of active infection at this time. Fortunately there's No fevers, chills, nausea, or vomiting noted at this time. Overall I feel like he is making good progress it does feel like that the right leg will we perform the [employer] seems to do with a bit better than three layer wrap on the left which slid down on him. We may switch to doing bilateral in the book wraps. 5/4; I have not seen Mr. Pareja in quite some time. According to our case manager he did not have an open wound on his left leg last week. He had 1 remaining wound on the right posterior medial calf. He arrives today with multiple openings on the left leg probably were blisters and/or wrap injuries from Unna boots. I do not think the Unna boot's will provide adequate compression on the left. I am also not clear about the frequency he is using the compression pumps. 03/17/19 on evaluation today patient appears to be doing excellent in regard to his lower extremities compared to last week's evaluation apparently. He had gotten significantly worse last week which is unfortunate. The [employer] wrap on the left did not seem to do very well for him at all and in fact it didn't control his swelling significantly enough he had an additional outbreak. Subsequently we go back to the four layer compression wrap on the left. This is good news. At least in that he is doing better and the wound seem to be killing him. He still has not heard anything from the lymphedema clinic. 03/24/19 on evaluation today patient actually appears to be doing much better in regard to his bilateral lower Trinity as compared to last week when I saw him. Fortunately there's no signs of active infection at this time. He has been tolerating the dressing changes without complication. Overall I'm extremely pleased with the progress and appearance in  general. 04/07/19 on evaluation today patient appears to be doing well in regard to his bilateral lower extremities. His swelling is significantly down from where it was previous. With that being said he does have a couple blisters still open at this point but fortunately nothing that seems to be too severe and again the majority of the larger openings has healed at this time. 04/14/19 on evaluation today patient actually appears to be doing quite well in regard to his bilateral lower extremities in fact I'm not even sure there's anything significantly open at this time at any site. Nonetheless he did have some trouble with these wraps where they are somewhat irritating him secondary to the fact that he has noted that the graph wasn't too close down to the end of this foot in a little bit short as well up to his knee. Otherwise things seem to be doing quite well. 04/21/19 upon evaluation today patient's wound bed actually showed evidence of being completely healed in regard to both lower extremities which is excellent news. There does not appear to be any signs of active infection which is also good news. I'm very pleased in this regard. No fevers, chills, nausea, or vomiting noted at this time. 04/28/19  on evaluation today patient appears to be doing a little bit worse in regard to both lower extremities on the left mainly due to the fact that when he went infection disease the wrap was not wrapped quite high enough he developed a blister above this. On the right he is a small open area of nothing too significant but again this is continuing to give him some trouble he has been were in the Velcro compression that he has at home. 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 He's been tolerating the dressing changes without complication. Fortunately there's no signs of active infection at this time. No fevers, chills, nausea, or vomiting noted at  this time. We have been trying to get an appointment with her lymphedema clinic in [facility] but unfortunately nobody can get them on phone with not been able to even fax information over the patient likewise is not been able to get in touch with them. Overall I'm not sure exactly what's going on here with to reach out again today. 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 Still having a lot of drainage unfortunately. He tells me especially in the left but even on the right. There's no signs of active infection which is good news we've been using so ratcheted up to this point. 05/19/19 on evaluation today patient actually appears to be doing quite well with regard to his left lower extremity which is great news. Fortunately in regard to the right lower extremity has an issues with his wrap and he subsequently did remove this from what I'm understanding. Nonetheless long story short is what he had rewrapped once he removed it subsequently had maggots underneath this wrap whenever he came in for evaluation today. With that being said they were obviously completely cleaned away by the nursing staff. The visit today which is excellent news. However he does appear to potentially have some infection around the right ankle region where the maggots were located as well. He will likely require anabiotic therapy today. 05/26/19 on evaluation today patient actually appears to be doing much better in regard to his bilateral lower extremities. I feel like the infection is under much better control. With that being said there were maggots noted when the wrap was removed yet again today. Again this could have potentially been left over from previous although at this time there does not appear to be any signs of significant drainage there was obviously on the wrap some drainage as well this contracted gnats or  otherwise. Either way I do not see anything that appears to be doing worse in my pinion and in fact I think his drainage has slowed down quite significantly likely mainly due to the fact to his infection being under better control. 06/02/2019 on evaluation today patient actually appears to be doing well with regard to his bilateral lower extremities there is no signs of active infection at this time which is great news. With that being said he does have several open areas more so on the right than the left but nonetheless these are all significantly better than previously noted. 06/09/2019 on evaluation today patient actually appears to be doing well. His wrap stayed up and he did not cause any problems he had more drainage on the right compared to the left but overall I do not see any major issues at  this time which is great news. 06/16/2019 on evaluation today patient appears to be doing excellent with regard to his lower extremities the only area that is open is a new blister that can have opened as of today on the medial ankle on the left. Other than this he really seems to be doing great I see no major issues at this point. 06/23/2019 on evaluation today patient appears to be doing quite well with regard to his bilateral lower extremities. In fact he actually appears to be almost completely healed there is a small area of weeping noted of the right lower extremity just above the ankle. Nonetheless fortunately there is no signs of active infection at this time which is good news. No fevers, chills, nausea, vomiting, or diarrhea. 8/24; the patient arrived for a nurse visit today but complained of very significant pain in the left leg and therefore I was asked to look at this. Noted that he did not have an open area on the left leg last week nevertheless this was wrapped. The patient states that he is not been able to put his compression pumps on the left leg because of the discomfort. He has not been  systemically unwell 06/30/2019 on evaluation today patient unfortunately despite being excellent last week is doing much worse with regard to his left lower extremity today. In fact he had to come in for a nurse on Monday where his left leg had to be rewrapped due to excessive weeping Dr. Leanord Hawking placed him on doxycycline at that point. Fortunately there is no signs of active infection Systemically at this time which is good news. 07/07/2019 in regard to the patient's wounds today he actually seems to be doing well with his right lower extremity there really is nothing open or draining at this point this is great news. Unfortunately the left lower extremity is given him additional trouble at this time. There does not appear to be any signs of active infection nonetheless he does have a lot of edema and swelling noted at this point as well as blistering all of which has led to a much more poor appearing leg at this time compared to where it was 2 weeks ago when it was almost completely healed. Obviously this is a little discouraging for the patient. He is try to contact the lymphedema clinic in [place] he has not been able to get through to them. 07/14/2019 on evaluation today patient actually appears to be doing slightly better with regard to his left lower extremity ulcers. Overall I do feel like at least at the top of the wrap that we have been placing this area has healed quite nicely and looks much better. The remainder of the leg is showing signs of improvement. Unfortunately in the thigh area he still has an open region on the left and again on the right he has been utilizing just a Band-Aid on an area that also opened on the thigh. Again this is an area that were not able to wrap although we did do an Ace wrap to provide some compression that something that obviously is a little less effective than the compression wraps we have been using on the lower portion of the leg. He does have an  appointment with the lymphedema clinic in [facility] on Friday. 07/21/2019 on evaluation today patient appears to be doing better with regard to his lower extremity ulcers. He has been tolerating the dressing changes without complication. Fortunately there is no signs of active  infection at this time. No fevers, chills, nausea, vomiting, or diarrhea. I did receive the paperwork from the physical therapist at the lymphedema clinic in [place]. Subsequently I signed off on that this morning and sent that back to him for further progression with the treatment plan. 07/28/2019 on evaluation today patient appears to be doing very well with regard to his right lower extremity where I do not see any open wounds at this point. Fortunately he is feeling great as far as that is concerned as well. In regard to the left lower extremity he has been having issues with still several areas of weeping and edema although the upper leg is doing better his lower leg still I think is going require the compression wrap at this time. No fevers, chills, nausea, vomiting, or diarrhea. 08/04/2019 on evaluation today patient unfortunately is having new wounds on the right lower extremity. Again we have been using Unna boot wrap on that side. We switched him to using his juxta lite wrap at home. With that being said he tells me he has been using it although his legs extremely swollen and to be honest really does not appear that he has been. I cannot know that for sure however. Nonetheless he has multiple new wounds on the right lower extremity at this time. Obviously we will have to see about getting this rewrapped for him today. 08/11/2019 on evaluation today patient appears to be doing fairly well with regard to his wounds. He has been tolerating the dressing changes including the compression wraps without complication. He still has a lot of edema in his upper thigh regions bilaterally he is supposed to be seeing the  lymphedema clinic on the 15th of this month once his wraps arrive for the upper part of his legs. 08/18/2019 on evaluation today patient appears to be doing well with regard to his bilateral lower extremities at this point. He has been tolerating the dressing changes without complication. Fortunately there is no signs of active infection which is also good news. He does have a couple weeping areas on the first and second toe of the right foot he also has just a small area on the left foot upper leg and a small area on the left lower leg but overall he is doing quite well in my opinion. He is supposed to be getting his wraps shortly in fact tomorrow and then subsequently is seeing the lymphedema clinic next Wednesday on the 21st. Of note he is also leaving on the 25th to go on vacation for a week to the beach. For that reason and since there is some uncertainty about what there can be doing at lymphedema clinic next Wednesday I am get a make an appointment for next Friday here for [place] to see what we need to do for him prior to him leaving for vacation. 10/23; patient arrives in considerable pain predominantly in the upper posterior calf just distal to the popliteal fossa also in the wound anteriorly above the major wound. This is probably cellulitis and he has had this recurrently in the past. He has no open wound on the right side and he has had an Radio broadcast assistant in that area. Finally I note that he has an area on the left posterior calf which by enlarge is mostly epithelialized. This protrudes beyond the borders of the surrounding skin in the setting of dry scaly skin and lymphedema. The patient is leaving for [facility] on Sunday. Per his longstanding pattern, he  will not take his compression pumps with him predominantly out of fear that they will be stolen. He therefore asked that we put a Unna boot back on the right leg. He will also contact the wound care center in [facility] to see if they can  change his dressing in the mid week. 11/3; patient returned from his vacation to [facility]. He was seen on 1 occasion at their wound care center. They did a 2 layer compression system as they did not have our 4-layer wrap. I am not completely certain what they put on the wounds. They did not change the Unna boot on the right. The patient is also seeing a lymphedema specialist physical therapist in [place]. It appears that he has some compression sleeve for his thighs which indeed look quite a bit better than I am used to seeing. He pumps over these with his external compression pumps. 11/10; the patient has a new wound on the right medial thigh otherwise there is no open areas on the right. He has an area on the left leg posteriorly anteriorly and medially and an area over the left second toe. We have been using silver alginate. He thinks the injury on his thigh is secondary to friction from the compression sleeve he has. 11/17; the patient has a new wound on the right medial thigh last week. He thinks this is because he did not have a underlying stocking for his thigh juxta lite apparatus. He now has this. The area is fairly large and somewhat angry but I do not think he has underlying cellulitis. He has a intact blister on the right anterior tibial area. Small wound on the right great toe dorsally Small area on the medial left calf. 11/30; the patient does not have any open areas on his right leg and we did not take his juxta lite stocking off. However he states that on Friday his compression wrap fell down lodging around his upper mid calf area. As usual this creates a lot of problems for him. He called urgently today to be seen for a nurse visit however the nurse visit turned into a provider visit because of extreme erythema and pain in the left anterior tibia extending laterally and posteriorly. The area that is problematic is extensive 10/06/2019 upon evaluation today patient actually  appears to be doing poorly in regard to his left lower extremity. He Dr. Leanord Hawking did place him on doxycycline this past Monday apparently due to the fact that he was doing much worse in regard to this left leg. Fortunately the doxycycline does seem to be helping. Unfortunately we are still having a very difficult time getting his edema under any type of control in order to anticipate discharge at some point. The only way were really able to control his lymphedema really is with compression wraps and that has only even seemingly temporary. He has been seeing a lymphedema clinic they are trying to help in this regard but still this has been somewhat frustrating in general for the patient. 10/13/19 on evaluation today patient appears to be doing excellent with regard to his right lower extremity as far as the wounds are concerned. His swelling is still quite extensive unfortunately. He is still having a lot of drainage from the thigh areas bilaterally which is unfortunate. He's been going to lymphedema clinic but again he still really does not have this edema under control as far as his lower extremities are concern. With regard to his left lower  extremity this seems to be improving and I do believe the doxycycline has been of benefit for him. He is about to complete the doxycycline. 10/20/2019 on evaluation today patient appears to be doing poorly in regard to his bilateral lower extremities. More in the right thigh he has a lot of irritation at this site unfortunately. In regard to the left lower extremity the wrap was not quite as high it appears and does seem to have caused him some trouble as well. Fortunately there is no evidence of systemic infection though he does have some blue-green drainage which has me concerned for the possibility of Pseudomonas. He tells me he is previously taking Cipro without complications and he really does not care for Levaquin however due to some of the side effects he  has. He is not allergic to any medications specifically antibiotics that were aware of. 10/27/2019 on evaluation today patient actually does appear to be for the most part doing better when compared to last week's evaluation. With that being said he still has multiple open wounds over the bilateral lower extremities. He actually forgot to start taking the Cipro and states that he still has the whole bottle. He does have several new blisters on left lower extremity today I think I would recommend he go ahead and take the Cipro based on what I am seeing at this point. 12/30-Patient comes at 1 week visit, 4 layer compression wraps on the left and Unna boot on the right, primary dressing Xtrasorb and silver alginate. Patient is taking his Cipro and has a few more days left probably 5-6, and the legs are doing better. He states he is using his compressions devices which I believe he has 11/10/2019 on evaluation today patient actually appears to be much better than last time I saw him 2 weeks ago. His wounds are significantly improved and overall I am very pleased in this regard. Fortunately there is no signs of active infection at this time. He is just a couple of days away from completing Cipro. Overall his edema is much better he has been using his lymphedema pumps which I think is also helping at this point. 11/17/2019 on evaluation today patient appears to be doing excellent in regard to his wounds in general. His legs are swollen but not nearly as much as they have been in the past. Fortunately he is tolerating the compression wraps without complication. No fevers, chills, nausea, vomiting, or diarrhea. He does have some erythema however in the distal portion of his right lower extremity specifically around the forefoot and toes there is a little bit of warmth here as well. 11/24/2019 on evaluation today patient appears to be doing well with regard to his right lower extremity I really do not see any open  wounds at this point. His left lower extremity does have several open areas and his right medial thigh also is open. Other than this however overall the patient seems to be making good progress and I am very pleased at this point. 12/01/2019 on evaluation today patient appears to be doing poorly at this point in regard to his left lower extremity has several new blisters despite the fact that we have him in compression wraps. In fact he had a 4-layer compression wrap, his upper thigh wrapped from lymphedema clinic, and a juxta light over top of the 4 layer compression wrap the lymphedema clinic applied and despite all this he still develop blisters underneath. Obviously this does have me concerned about  the fact that unfortunately despite what we are doing to try to get wounds healed he continues to have new areas arise I do not think he is ever good to be at the point where he can realistically just use wraps at home to keep things under control. Typically when we heal him it takes about 1-2 days before he is back in the clinic with severe breakdown and blistering of his lower extremities bilaterally. This is happened numerous times in the past. Unfortunately I think that we may need some help as far as overall fluid overload to kind of limit what we are seeing and get things under better control. 12/08/2019 on evaluation today patient presents for follow-up concerning his ongoing bilateral lower extremity edema. Unfortunately he is still having quite a bit of swelling the compression wraps are controlling this to some degree but he did see Dr. [name] his cardiologist I do have that available for review today as far as the appointment was concerned that was on 12/06/2019. Obviously that she has been 2 days ago. The patient states that he is only been taking the Lasix 80 mg 1 time a day he had told me previously he was taking this twice a day. Nonetheless Dr. [name] recommended this be up to 80 mg 2 times a  day for the patient as he did appear to be fluid overloaded. With that being said the patient states he did this yesterday and he was unable to go anywhere or do anything due to the fact that he was constantly having to urinate. Nonetheless I think that this is still good to be something that is important for him as far as trying to get his edema under control at all things that he is going to be able to just expect his wounds to get under control and things to be better without going through at least a period of time where he is trying to stabilize his fluid management in general and I think increasing the Lasix is likely the first step here. It was also mentioned the possibility that the patient may require metolazone. With that being said he wanted to have the patient take Lasix twice a day first and then reevaluating 2 months to see where things stand. 12/15/2019 upon evaluation today patient appears to be doing regard to his legs although his toes are showing some signs of weeping especially on the left at this point to some degree on the right. There does not appear to be any signs of active infection and overall I do feel like the compression wraps are doing well for him but he has not been able to take the Lasix at home and the increased dose that Dr. [name] recommended. He tells me that just not go to be feasible for him. Nonetheless I think in this case he should probably send a message to Dr. [name] in order to discuss options from the standpoint of possible admission to get the fluid off or otherwise going forward. 12/22/2019 upon evaluation today patient appears to be doing fairly well with regard to his lower extremities at this point. In fact he would be doing excellent if it was not for the fact that his right anterior thigh apparently had an allergic reaction to adhesive tape that he used. The wound itself that we have been monitoring actually appears to be healed. There is a lot of  irritation at this point. 12/29/2019 upon evaluation today patient appears to be doing well in  regard to his lower extremities. His left medial thigh is open and somewhat draining today but this is the only region that is open the right has done much better with the treatment utilizing the steroid cream that I prescribed for him last week. Overall I am pleased in that regard. Fortunately there is no signs of active infection at this time. No fevers, chills, nausea, vomiting, or diarrhea. 01/05/2020 upon evaluation today patient appears to be doing more poorly in regard to his right lower extremity at this point upon evaluation today. Unfortunately he continues to have issues in this regard and I think the biggest issue is controlling his edema. This obviously is not very well controlled at this point is been recommended that he use the Lasix twice a day but he has not been able to do that. Unfortunately I think this is leading to an issue where honestly he is not really able to effectively control his edema and therefore the wounds really are not doing significantly better. I do not think that he is going to be able to keep things under good control unless he is able to control his edema much better. I discussed this again in great detail with him today. 01/12/2020 good news is patient actually appears to be doing quite well today at this point. He does have an appointment with lymphedema clinic tomorrow. His legs appear healed and the toe on the left is almost completely healed. In general I am very pleased with how things stand at this point. 01/19/2020 upon evaluation today patient appears to actually be doing well in regard to his lower extremities there is nothing open at this point. Fortunately he has done extremely well more recently. Has been seeing lymphedema clinic as well. With that being said he has Velcro wraps for his lower legs as well as his upper legs. The only wound really is on his toe  which is the right great toe and this is barely anything even there. With all that being said I think it is good to be appropriate today to go ahead and switch him over to the Velcro compression wraps. 01/26/2020 upon evaluation today patient appears to be doing worse with regard to his lower extremities after last week switch him to Velcro compression wraps. Unfortunately he lasted less than 24 hours he did not have the sock portion of his Velcro wrap on the left leg and subsequently developed a blister underneath the Velcro portion. Obviously this is not good and not what we were looking for at this point. He states the lymphedema clinic did tell him to wear the wrap for 23 hours and take him off for 1 I am okay with that plan but again right now we got a get things back under control again he may have some cellulitis noted as well. 02/02/2020 upon evaluation today patient unfortunately appears to have several areas of blistering on his bilateral lower extremities today mainly on the feet. His legs do seem to be doing somewhat better which is good news. Fortunately there is no evidence of active infection at this time. No fevers, chills, nausea, vomiting, or diarrhea. 02/16/2020 upon evaluation today patient appears to be doing well at this time with regard to his legs. He has a couple weeping areas on his toes but for the most part everything is doing better and does appear to be sealed up on his legs which is excellent news. We can continue with wrapping him at this point as  he had every time we discontinue the wraps he just breaks out with new wounds. There is really no point in is going forward with this at this point. 03/08/2020 upon evaluation today patient actually appears to be doing quite well with regard to his lower extremity ulcers. He has just a very superficial and really almost nonexistent blister on the left lower extremity he has in general done very well with the compression wraps. With  that being said I do not see any signs of infection at this time which is good news. 03/29/2020 upon evaluation today patient appears to be doing well with regard to his wounds currently except for where he had several new areas that opened up due to some of the wrap slipping and causing him trouble. He states he did not realize they had slipped. Nonetheless he has a 1 area on the right and 3 new areas on the left. Fortunately there is no signs of active infection at this time which is great news. 04/05/2020 upon evaluation today patient actually appears to be doing quite well in general in regard to his legs currently. Fortunately there is no signs of active infection at this time. No fevers, chills, nausea, vomiting, or diarrhea. He tells me next week that he will actually be seen in the lymphedema clinic on Thursday at 10 AM I see him on Wednesday next week. 04/12/2020 upon evaluation today patient appears to be doing very well with regard to his lower extremities bilaterally. In fact he does not appear to have any open wounds at this point which is good news. Fortunately there is no signs of active infection at this time. No fevers, chills, nausea, vomiting, or diarrhea. 04/19/2020 upon evaluation today patient appears to be doing well with regard to his wounds currently on the bilateral lower extremities. There does not appear to be any signs of active infection at this time. Fortunately there is no evidence of systemic infection and overall very pleased at this point. Nonetheless after I held him out last week he literally had blisters the next morning already which swelled up with him being right back here in the clinic. Overall I think that he is just not can be able to be discharged with his legs the way they are he is much to volume overloaded as far as fluid is concerned and that was discussed with him today of also discussed this but should try the clinic nurse manager as well as Dr.  Leanord Hawking. 04/26/2020 upon evaluation today patient appears to be doing better with regard to his wounds currently. He is making some progress and overall swelling is under good control with the compression wraps. Fortunately there is no evidence of active infection at this time. 05/10/2020 on evaluation today patient appears to be doing overall well in regard to his lower extremities bilaterally. He is Tolerating the compression wraps without complication and with what we are seeing currently I feel like that he is making excellent progress. There is no signs of active infection at this time. 05/24/2020 upon evaluation today patient appears to be doing well in regard to his legs. The swelling is actually quite a bit down compared to where it has been in the past. Fortunately there is no sign of active infection at this time which is also good news. With that being said he does have several wounds on his toes that have opened up at this point. 05/31/2020 upon evaluation today patient appears to be doing well with  regard to his legs bilaterally where he really has no significant fluid buildup at this point overall he seems to be doing quite well. Very pleased in this regard. With regard to his toes these also seem to be drying up which is excellent. We have continue to wrap him as every time we tried as a transition to the juxta light wraps things just do not seem to get any better. 06/07/2020 upon evaluation today patient appears to be doing well with regard to his right leg at this point. Unfortunately left leg has a lot of blistering he tells me the wrap started to slide down on him when he tried to put his other Velcro wrap over top of it to help keep things in order but nonetheless still had some issues. 06/14/2020 on evaluation today patient appears to be doing well with regard to his lower extremity ulcers and foot ulcers at this point. I feel like everything is actually showing signs of improvement which  is great news overall there is no signs of active infection at this time. No fevers, chills, nausea, vomiting, or diarrhea. 06/21/2020 on evaluation today patient actually appears to be doing okay in regard to his wounds in general. With that being said the biggest issue I see is on his right foot in particular the first and second toe seem to be doing a little worse due to the fact this is staying very wet. I think he is probably getting need to change out his dressings a couple times in between each week when we see him in regard to his toes in order to keep this drier based on the location and how this is proceeding. 06/28/2020 on evaluation today patient appears to be doing a little bit more poorly overall in regard to the appearance of the skin I am actually somewhat concerned about the possibility of him having a little bit of an infection here. We discussed the course of potentially giving him a doxycycline prescription which he is taken previously with good result. With that being said I do believe that this is potentially mild and at this point easily fixed. I just do not want anything to get any worse. 07/12/2020 upon evaluation today patient actually appears to be making some progress with regard to his legs which is great news there does not appear to be any evidence of active infection. Overall very pleased with where things stand. 07/26/2020 upon evaluation today patient appears to be doing well with regard to his leg ulcers and toe ulcers at this point. He has been tolerating the compression wraps without complication overall very pleased in this regard. 08/09/2020 upon evaluation today patient appears to be doing well with regard to his lower extremities bilaterally. Fortunately there is no signs of active infection overall I am pleased with where things stand. Electronic Signature(s) Signed: 08/09/2020 11:59:57 AM By: Lenda Kelp PA-C Entered By: Lenda Kelp on 08/09/2020  11:59:56 -------------------------------------------------------------------------------- Physical Exam Details Patient Name: Date of Service: [name], Ambrosio J. 08/09/2020 10:30 A M Medical Record Number: [id number] Patient Account Number: [id number] Date of Birth/Sex: Treating RN: Feb 13, 1951 (69 y.o. [name] Primary Care Provider: Nicoletta Ba Other Clinician: Referring Provider: Treating Provider/Extender: Adele Dan in Treatment: 237 Constitutional Obese and well-hydrated in no acute distress. Respiratory normal breathing without difficulty. Psychiatric this patient is able to make decisions and demonstrates good insight into disease process. Alert and Oriented x 3. pleasant and cooperative. Notes His  wound bed showed signs of excellent granulation and epithelization overall I feel like his legs are doing well with lymphedema is under good control with wraps. He did have some bleeding at the great toe on the left but again this looks like it might have been a trauma type situation I do not see any signs right now of active infection in general but there is evidence that it was bleeding along the edge of the toe. Electronic Signature(s) Signed: 08/09/2020 12:00:20 PM By: Lenda Kelp PA-C Entered By: Lenda Kelp on 08/09/2020 12:00:20 -------------------------------------------------------------------------------- Physician Orders Details Patient Name: Date of Service: [name], Jefry J. 08/09/2020 10:30 A M Medical Record Number: [id number] Patient Account Number: [id number] Date of Birth/Sex: Treating RN: 09-Sep-1951 (69 y.o. 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 - with Leonard Schwartz Return A Nurse Visit: - 1 week Dressing Change Frequency Wound #183 Left T Second oe Do not change entire dressing for one week. Wound #184 Left,Anterior Lower Leg Do not change entire dressing for one week. Skin Barriers/Peri-Wound Care Moisturizing lotion - both legs and feet and toes Wound Cleansing May shower with protection. Primary Wound Dressing Wound #183 Left T Second oe lginate with Silver - to any blisters or weeping areas Calcium A Wound #184 Left,Anterior Lower Leg lginate with Silver - to any blisters or weeping areas Calcium A Secondary Dressing Wound #183 Left T Second oe Kerlix/Rolled Gauze - secure with coban Dry Gauze Wound #184 Left,Anterior Lower Leg Dry Gauze Edema Control 4 layer compression: Left lower extremity Unna Boot to Right Lower Extremity Avoid standing for long periods of time Elevate legs to the level of the heart or above for 30 minutes daily and/or when sitting, a frequency of: - throughout the day Exercise regularly Electronic Signature(s) Signed: 08/09/2020 6:14:29 PM By: Zenaida Deed RN, BSN Signed: 08/09/2020 6:38:49 PM By: Lenda Kelp PA-C Entered By: Zenaida Deed on 08/09/2020 12:00:06 -------------------------------------------------------------------------------- Problem List Details Patient Name: Date of Service: [name], Jarome J. 08/09/2020 10:30 A M Medical Record Number: [id number] Patient Account Number: [id number] Date of Birth/Sex: Treating  RN: 12-10-1950 (69 y.o. [name] Primary Care Provider: Nicoletta Ba Other Clinician: Referring Provider: Treating Provider/Extender: Adele Dan in Treatment: [phone] Active Problems ICD-10 Encounter Code Description Active Date MDM Diagnosis 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 skin 06/30/2018 No Yes L97.221 Non-pressure chronic ulcer of left calf limited to breakdown of skin 09/30/2016 No Yes 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 of 01/22/2016 No Yes bilateral lower extremity I89.0 Lymphedema, not elsewhere classified 01/22/2016 No Yes E11.622 Type 2 diabetes mellitus with other skin ulcer 01/22/2016 No Yes 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, unspecified 01/22/2016 No Yes L03.116 Cellulitis of left lower limb 04/01/2017 No Yes Inactive Problems ICD-10 Code Description Active Date Inactive Date L97.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 right calf  limited to breakdown of skin 06/30/2017 06/30/2017 L97.521 Non-pressure chronic ulcer of other 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 of skin 04/27/2018 04/27/2018 L03.115 Cellulitis of right lower limb 12/22/2017 12/22/2017 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 06/30/2018 06/30/2018 L97.511 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 of skin 06/30/2018 06/30/2018 Resolved Problems Electronic Signature(s) Signed: 08/09/2020 11:18:24 AM By: Lenda Kelp PA-C Entered By: Lenda Kelp on 08/09/2020 11:18:23 -------------------------------------------------------------------------------- Progress Note Details Patient Name: Date of Service: [name], Breslin J. 08/09/2020 10:30 A M Medical Record Number: [id number] Patient Account Number: [id number] Date of Birth/Sex: Treating RN: 02-25-1951 (69 y.o. [name] Primary Care Provider: Nicoletta Ba  Other Clinician: Referring Provider: Treating Provider/Extender: Adele Dan in Treatment: 237 Subjective Chief Complaint Information obtained from Patient patient is here for evaluation venous/lymphedema weeping History of Present Illness (HPI) Referred by PCP for consultation. Patient has long standing history of BLE venous stasis, no prior ulcerations. At beginning of month, developed cellulitis and weeping. Received IM Rocephin followed by Keflex and resolved. Wears compression stocking, appr 6 months old. Not sure strength. No present drainage. 01/22/16 this is a patient who is a type II diabetic on insulin. 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 He tells me he religiously wears pressure stockings of uncertain strength. He was here with weeping edema about 8 months ago but did not have an open wound. Roughly a month ago he had a reopening on his bilateral legs. He is been using bandages and Neosporin. He does not complain of pain. 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 Last BUN/creatinine I have is from 11/20/15 at 13 and 1.0 respectively 01/29/16; patient arrives today having tolerated the Profore wrap. He brought in his stockings and these are 18 mmHg stockings he bought from [employer]. The compression here is likely inadequate. He does not complain of pain or excessive drainage she has no systemic symptoms. The wound on the right looks improved as does the one on the left although one on the left is more substantial with still tissue at risk below the actual wound area on the bilateral posterior calf 02/05/16; patient arrives with poor edema control. He states that we did put a 4 layer compression on it last week. No weight appear 5 this. 02/12/16; the area on the posterior right Has healed. The left Has a substantial  wound that has necrotic surface eschar that requires a debridement with a curette. 02/16/16;the patient called or a Nurse visit secondary to increased swelling. He had been in earlier in the week with his right leg healed. He was transitioned to is on pressure stocking on the right leg with the only open wound on the left, a substantial area on the left posterior calf. Note he has a history of severe lower extremity edema, he has a history of chronic atrial fibrillation but not heart failure per my notes but I'll need to research this. He is not complaining of chest pain shortness of breath or orthopnea. The intake nurse noted blisters on the previously closed right leg 02/19/16; this is the patient's regular visit day. I see him on Friday with escalating edema new wounds on the right leg and clear signs of at least right ventricular heart failure. I increased his Lasix to 40 twice  a day. He is returning currently in follow-up. States he is noticed a decrease in that the edema 02/26/16 patient's legs have much less edema. There is nothing really open on the right leg. The left leg has improved condition of the large superficial wound on the posterior left leg 03/04/16; edema control is very much better. The patient's right leg wounds have healed. On the left leg he continues to have severe venous inflammation on the posterior aspect of the left leg. There is no tenderness and I don't think any of this is cellulitis. 03/11/16; patient's right leg is married healed and he is in his own stocking. The patient's left leg has deteriorated somewhat. There is a lot of erythema around the wound on the posterior left leg. There is also a significant rim of erythema posteriorly just above where the wrap would've ended there is a new wound in this location and a lot of tenderness. Can't rule out cellulitis in this area. 03/15/16; patient's right leg remains healed and he is in his own stocking. The patient's left leg is  much better than last review. His major wound on the posterior aspect of his left Is almost fully epithelialized. He has 3 small injuries from the wraps. Really. Erythema seems a lot better on antibiotics 03/18/16; right leg remains healed and he is in his own stocking. The patient's left leg is much better. The area on the posterior aspect of the left calf is fully epithelialized. His 3 small injuries which were wrap injuries on the left are improved only one seems still open his erythema has resolved 03/25/16; patient's right leg remains healed and he is in his own stocking. There is no open area today on the left leg posterior leg is completely closed up. His wrap injuries at the superior aspect of his leg are also resolved. He looks as though he has some irritation on the dorsal ankle but this is fully epithelialized without evidence of infection. 03/28/16; we discharged this patient on Monday. Transitioned him into his own stocking. There were problems almost immediately with uncontrolled swelling weeping edema multiple some of which have opened. He does not feel systemically unwell in particular no chest pain no shortness of breath and he does not feel 04/08/16; the edema is under better control with the Profore light wrap but he still has pitting edema. There is one large wound anteriorly 2 on the medial aspect of his left leg and 3 small areas on the superior posterior calf. Drainage is not excessive he is tolerating a Profore light well 04/15/16; put a Profore wrap on him last week. This is controlled is edema however he had a lot of pain on his left anterior foot most of his wounds are healed 04/22/16 once again the patient has denuded areas on the left anterior foot which he states are because his wrap slips up word. He saw his primary physician today is on Lasix 40 twice a day and states that he his weight is down 20 pounds over the last 3 months. 04/29/16: Much improved. left anterior foot much  improved. He is now on Lasix 80 mg per day. Much improved edema control 05/06/16; I was hoping to be able to discharge him today however once again he has blisters at a low level of where the compression was placed last week mostly on his left lateral but also his left medial leg and a small area on the anterior part of the left foot. 05/09/16; apparently the  patient went home after his appointment on 7/4 later in the evening developing pain in his upper medial thigh together with subjective fever and chills although his temperature was not taken. The pain was so intense he felt he would probably have to call 911. However he then remembered that he had leftover doxycycline from a previous round of antibiotics and took these. By the next morning he felt a lot better. He called and spoke to one of our nurses and I approved doxycycline over the phone thinking that this was in relation to the wounds we had previously seen although they were definitely were not. The patient feels a lot better old fever no chills he is still working. Blood sugars are reasonably controlled 05/13/16; patient is back in for review of his cellulitis on his anterior medial upper thigh. He is taking doxycycline this is a lot better. Culture I did of the nodular area on the dorsal aspect of his foot grew MRSA this also looks a lot better. 05/20/16; the patient is cellulitis on the medial upper thigh has resolved. All of his wound areas including the left anterior foot, areas on the medial aspect of the left calf and the lateral aspect of the calf at all resolved. He has a new blister on the left dorsal foot at the level of the fourth toe this was excised. No evidence of infection 05/27/16; patient continues to complain weeping edema. He has new blisterlike wounds on the left anterior lateral and posterior lateral calf at the top of his wrap levels. The area on his left anterior foot appears better. He is not complaining of fever, pain or  pruritus in his feet. 05/30/16; the patient's blisters on his left anterior leg posterior calf all look improved. He did not increase the Lasix 100 mg as I suggested because he was going to run out of his 40 mg tablets. He is still having weeping edema of his toes 06/03/16; I renewed his Lasix at 80 mg once a day as he was about to run out when I last saw him. He is on 80 mg of Lasix now. I have asked him to cut down on the excessive amount of water he was drinking and asked him to drink according to his thirst mechanisms 06/12/2016 -- was seen 2 days ago and was supposed to wear his compression stockings at home but he is developed lymphedema and superficial blisters on the left lower extremity and hence came in for a review 06/24/16; the remaining wound is on his left anterior leg. He still has edema coming from between his toes. There is lymphedema here however his edema is generally better than when I last saw this. He has a history of atrial fibrillation but does not have a known history of congestive heart failure nevertheless I think he probably has this at least on a diastolic basis. 07/01/16 I reviewed his echocardiogram from January 2017. This was essentially normal. He did not have LVH, EF of 55-60%. His right ventricular function was normal although he did have trivial tricuspid and pulmonic regurgitation. This is not audible on exam however. I increased his Lasix to do massive edema in his legs well above his knees I think in early July. He was also drinking an excessive amount of water at the time. 07/15/16; missed his appointment last week because of the Labor Day holiday on Monday. He could not get another appointment later in the week. Started to feel the wrap digging in superiorly  so we remove the top half and the bottom half of his wrap. He has extensive erythema and blistering superiorly in the left leg. Very tender. Very swollen. Edema in his foot with leaking edema fluid. He has not  been systemically unwell 07/22/16; the area on the left leg laterally required some debridement. The medial wounds look more stable. His wrap injury wounds appear to have healed. Edema and his foot is better, weeping edema is also better. He tells me he is meeting with the supplier of the external compression pumps at work 08/05/16; the patient was on vacation last week in [facility]. His wrap is been on for an extended period of time. Also over the weekend he developed an extensive area of tender erythema across his anterior medial thigh. He took to doxycycline yesterday that he had leftover from a previous prescription. The patient complains of weeping edema coming out of his toes 08/08/16; I saw this patient on 10/2. He was tender across his anterior thigh. I put him on doxycycline. He returns today in follow-up. He does not have any open wounds on his lower leg, he still has edema weeping into his toes. 08/12/16; patient was seen back urgently today to follow-up for his extensive left thigh cellulitis/erysipelas. He comes back with a lot less swelling and erythema pain is much better. I believe I gave him Augmentin and Cipro. His wrap was cut down as he stated a roll down his legs. He developed blistering above the level of the wrap that remained. He has 2 open blisters and 1 intact. 08/19/16; patient is been doing his primary doctor who is increased his Lasix from 40-80 once a day or 80 already has less edema. Cellulitis has remained improved in the left thigh. 2 open areas on the posterior left calf 08/26/16; he returns today having new open blisters on the anterior part of his left leg. He has his compression pumps but is not yet been shown how to use some vital representative from the supplier. 09/02/16 patient returns today with no open wounds on the left leg. Some maceration in his plantar toes 09/10/2016 -- Dr. Leanord Hawking had recently discharged him on 09/02/2016 and he has come right back with  redness swelling and some open ulcers on his left lower extremity. He says this was caused by trying to apply his compression stockings and he's been unable to use this and has not been able to use his lymphedema pumps. He had some doxycycline leftover and he has started on this a few days ago. 09/16/16; there are no open wounds on his leg on the left and no evidence of cellulitis. 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 He does not complain of symptoms here. I am not clear use using his external compression pumps. 09/23/16; I have not seen this patient in 2 weeks. He canceled his appointment 10 days ago as he was going on vacation. He tells me that on Monday he noticed a large area on his posterior left leg which is been draining copiously and is reopened into a large wound. He is been using ABDs and the external part of his juxtalite, according to our nurse this was not on properly. 10/07/16; Still a substantial area on the posterior left leg. Using silver alginate 10/14/16; in general better although there is still open area which looks healthy. Still using silver alginate. He reminds me that this happen before he left for [facility]  [place]. T oday while he was showering in the morning. He had been using his juxtalite's 10/21/16; the area on his posterior left leg is fully epithelialized. However he arrives today with a large area of tender erythema in his medial and posterior left thigh just above the knee. I have marked the area. Once again he is reluctant to consider hospitalization. I treated him with oral antibiotics in the past for a similar situation with resolution I think with doxycycline however this area it seems more extensive to me. He is not complaining of fever but does have chills and says states he is thirsty. His blood sugar today was in the 140s at home 10/25/16 the area on his posterior left leg is fully epithelialized  although there is still some weeping edema. The large area of tenderness and erythema in his medial and posterior left thigh is a lot less tender although there is still a lot of swelling in this thigh. He states he feels a lot better. He is on doxycycline and Augmentin that I started last week. This will continued until Tuesday, December 26. I have ordered a duplex ultrasound of the left thigh rule out DVT whether there is an abscess something that would need to be drained I would also like to know. 11/01/16; he still has weeping edema from a not fully epithelialized area on his left posterior calf. Most of the rest of this looks a lot better. He has completed his antibiotics. His thigh is a lot better. Duplex ultrasound did not show a DVT in the thigh 11/08/16; he comes in today with more Denuded surface epithelium from the posterior aspect of his calf. There is no real evidence of cellulitis. The superior aspect of his wrap appears to have put quite an indentation in his leg just below the knee and this may have contributed. He does not complain of pain or fever. We have been using silver alginate as the primary dressing. The area of cellulitis in the right thigh has totally resolved. He has been using his compression stockings once a week 11/15/16; the patient arrives today with more loss of epithelium from the posterior aspect of his left calf. He now has a fairly substantial wound in this area. The reason behind this deterioration isn't exactly clear although his edema is not well controlled. He states he feels he is generally more swollen systemically. He is not complaining of chest pain shortness of breath fever. T me he has an appointment with his primary physician in early February. He is on 80 mg of oral ells Lasix a day. He claims compliance with the external compression pumps. He is not having any pain in his legs similar to what he has with his recurrent cellulitis 11/22/16; the patient  arrives a follow-up of his large area on his left lateral calf. This looks somewhat better today. He came in earlier in the week for a dressing change since I saw him a week ago. He is not complaining of any pain no shortness of breath no chest pain 11/28/16; the patient arrives for follow-up of his large area on the left lateral calf this does not look better. In fact it is larger weeping edema. The surface of the wound does not look too bad. We have been using silver alginate although I'm not certain that this is a dressing issue. 12/05/16; again the patient follows up for a large wound on the left lateral and left posterior calf this does not look better.  There continues to be weeping edema necrotic surface tissue. More worrisome than this once again there is erythema below the wound involving the distal Achilles and heel suggestive of cellulitis. He is on his feet working most of the day of this is not going well. We are changing his dressing twice a week to facilitate the drainage. 12/12/16; not much change in the overall dimensions of the large area on the left posterior calf. This is very inflamed looking. I gave him an. Doxycycline last week does not really seem to have helped. He found the wrap very painful indeed it seems to of dog into his legs superiorly and perhaps around the heel. He came in early today because the drainage had soaked through his dressings. 12/19/16- patient arrives for follow-up evaluation of his left lower extremity ulcers. He states that he is using his lymphedema pumps once daily when there is "no drainage". He admits to not using his lipedema pumps while under current treatment. His blood sugars have been consistently between 150-200. 12/26/16; the patient is not using his compression pumps at home because of the wetness on his feet. I've advised him that I think it's important for him to use this daily. He finds his feet too wet, he can put a plastic bag over his legs while  he is in the pumps. Otherwise I think will be in a vicious circle. We are using silver alginate to the major area on his left posterior calf 01/02/17; the patient's posterior left leg has further of all into 3 open wounds. All of them covered with a necrotic surface. He claims to be using his compression pumps once a day. His edema control is marginal. Continue with silver alginate 01/10/17; the patient's left posterior leg actually looks somewhat better. There is less edema, less erythema. Still has 3 open areas covered with a necrotic surface requiring debridement. He claims to be using his compression pumps once a day his edema control is better 01/17/17; the patient's left posterior calf look better last week when I saw him and his wrap was changed 2 days ago. He has noted increasing pain in the left heel and arrives today with much larger wounds extensive erythema extending down into the entire heel area especially tender medially. He is not systemically unwell CBGs have been controlled no fever. Our intake nurse showed me limegreen drainage on his AVD pads. 01/24/17; his usual this patient responds nicely to antibiotics last week giving him Levaquin for presumed Pseudomonas. The whole entire posterior part of his leg is much better much less inflamed and in the case of his Achilles heel area much less tender. He has also had some epithelialization posteriorly there are still open areas here and still draining but overall considerably better 01/31/17- He has continue to tolerate the compression wraps. he states that he continues to use the lymphedema pumps daily, and can increase to twice daily on the weekends. He is voicing no complaints or concerns regarding his LLE ulcers 02/07/17-he is here for follow-up evaluation. He states that he noted some erythema to the left medial and anterior thigh, which he states is new as of yesterday. He is concerned about recurrent cellulitis. He states his blood sugars  have been slightly elevated, this morning in the 180s 02/14/17; he is here for follow-up evaluation. When he was last here there was erythema superiorly from his posterior wound in his anterior thigh. He was prescribed Levaquin however a culture of the wound surface grew MRSA  over the phone I changed him to doxycycline on Monday and things seem to be a lot better. 02/24/17; patient missed his appointment on Friday therefore we changed his nurse visit into a physician visit today. Still using silver alginate on the large area of the posterior left thigh. He isn't new area on the dorsal left second toe 03/03/17; actually better today although he admits he has not used his external compression pumps in the last 2 days or so because of work responsibilities over the weekend. 03/10/17; continued improvement. External compression pumps once a day almost all of his wounds have closed on the posterior left calf. Better edema control 03/17/17; in general improved. He still has 3 small open areas on the lateral aspect of his left leg however most of the area on the posterior part of his leg is epithelialized. He has better edema control. He has an ABD pad under his stocking on the right anterior lower leg although he did not let us look at that today. 03/24/17; patient arrives back in clinic today with no open areas however there are areas on the posterior left calf and anterior left calf that are less than 100% epithelialized. His edema is well controlled in the left lower leg. There is some pitting edema probably lymphedema in the left upper thigh. He uses compression pumps at home once per day. I tried to get him to do this twice a day although he is very reticent. 04/01/2017 -- for the last 2 days he's had significant redness, tenderness and weeping and came in for an urgent visit today. 04/07/17; patient still has 6 more days of doxycycline. He was seen by Dr. Meyer Russel last Wednesday for cellulitis involving the  posterior aspect, lateral aspect of his Involving his heel. For the most part he is better there is less erythema and less weeping. He has been on his feet for 12 hours o2 over the weekend. Using his compression pumps once a day 04/14/17 arrives today with continued improvement. Only one area on the posterior left calf that is not fully epithelialized. He has intense bilateral venous inflammation associated with his chronic venous insufficiency disease and secondary lymphedema. We have been using silver alginate to the left posterior calf wound In passing he tells [place] today that the right leg but we have not seen in quite some time has an open area on it but he doesn't want [place] to look at this today states he will show this to [place] next week. 04/21/17; there is no open area on his left leg although he still reports some weeping edema. He showed [place] his right leg today which is the first time we've seen this leg in a long time. He has a large area of open wound on the right leg anteriorly healthy granulation. Quite a bit of swelling in the right leg and some degree of venous inflammation. He told us about the right leg in passing last week but states that deterioration in the right leg really only happened over the weekend 04/28/17; there is no open area on the left leg although there is an irritated part on the posterior which is like a wrap injury. The wound on the right leg which was new from last week at least to [place] is a lot better. 05/05/17; still no open area on the left leg. Patient is using his new compression stocking which seems to be doing a good job of controlling the edema. He states he is using his  compression pumps once per day. The right leg still has an open wound although it is better in terms of surface area. Required debridement. A lot of pain in the posterior right Achilles marked tenderness. Usually this type of presentation this patient gives concern for an active cellulitis 05/12/17;  patient arrives today with his major wound from last week on the right lateral leg somewhat better. Still requiring debridement. He was using his compression stocking on the left leg however that is reopened with superficial wounds anteriorly he did not have an open wound on this leg previously. He is still using his juxta light's once daily at night. He cannot find the time to do this in the morning as he has to be at work by 7 AM 05/19/17; right lateral leg wound looks improved. No debridement required. The concerning area is on the left posterior leg which appears to almost have a subcutaneous hemorrhagic component to it. We've been using silver alginate to all the wounds 05/26/17; the right lateral leg wound continues to look improved. However the area on the left posterior calf is a tightly adherent surface. [place] using silver alginate. Because of the weeping edema in his legs there is very little good alternatives. 06/02/17; the patient left here last week looking quite good. Major wound on the left posterior calf and a small one on the right lateral calf. Both of these look satisfactory. He tells me that by Wednesday he had noted increased pain in the left leg and drainage. He called on Thursday and Friday to get an appointment here but we were blocked. He did not go to urgent care or his primary physician. He thinks he had a fever on Thursday but did not actually take his temperature. He has not been using his compression pumps on the left leg because of pain. I advised him to go to the emergency room today for IV antibiotics for stents of left leg cellulitis but he has refused I have asked him to take 2 days off work to keep his leg elevated and he has refused this as well. In view of this I'm going to call him and Augmentin and doxycycline. 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 However after a detailed discussion regarding the risk benefits and alternatives he refuses to get admitted to the hospital. With no other recourse I will continue him on oral antibiotics as before and hopefully he'll have his infectious disease consultation this week. 06/16/2017 -- the patient was seen today by the nurse practitioner at infectious disease Ms. Dixon. 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 He has also been advise Lotrimin cream locally. she also had wise IV antibiotics for his condition if it flares up 06/23/17; patient arrives today with drainage bilaterally although the remaining wound on the left posterior calf after cleaning up today "highlighter yellow drainage" did not look too bad. Unfortunately he has had breakdown on the right anterior leg [previously this leg had not been open and he is using a black stocking] he went to see infectious  disease and is been put on clindamycin 150 mg daily, I did not verify the dose although I'm not familiar with using clindamycin in this dosing range, perhaps for prophylaxisoo 06/27/17; I brought this patient back today to follow-up on the wound deterioration on the right lower leg together with surrounding cellulitis. I started him on doxycycline 4 days ago. This area looks better however he comes in today with intense cellulitis on the medial part of his left thigh. This is not have a wound in this area. Extremely tender. We've been using silver alginate to the wounds on the right lower leg left lower leg with bilateral 4 layer compression he is using his external compression pumps once a day 07/04/17; patient's left medial thigh  cellulitis looks better. He has not been using his compression pumps as his insert said it was contraindicated with cellulitis. His right leg continues to make improvements all the wounds are still open. We only have one remaining wound on the left posterior calf. Using silver alginate to all open areas. He is on doxycycline which I started a week ago and should be finishing I gave him Augmentin after Thursday's visit for the severe cellulitis on the left medial thigh which fortunately looks better 07/14/17; the patient's left medial thigh cellulitis has resolved. The cellulitis in his right lower calf on the right also looks better. All of his wounds are stable to improved we've been using silver alginate he has completed the antibiotics I have given him. He has clindamycin 150 mg once a day prescribed by infectious disease for prophylaxis, I've advised him to start this now. We have been using bilateral Unna boots over silver alginate to the wound areas 07/21/17; the patient is been to see infectious disease who noted his recurrent problems with cellulitis. He was not able to tolerate prophylactic clindamycin therefore he is on amoxicillin 500 twice a day. He also had a second daily dose of Lasix added By Dr. Oneta Rack but he is not taking this. Nor is he being completely compliant with his compression pumps a especially not this week. He has 2 remaining wounds one on the right posterior lateral lower leg and one on the left posterior medial lower leg. 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 The patient has Unna boots bilaterally. 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 He has bilateral Unna boots for compression and silver alginate to his wounds. Arrives today with his legs  looking as good as I have seen him in quite some time. Not surprisingly his wounds look better as well with improvement on the right lateral leg venous insufficiency wound and also the left medial leg. He is still using the compression pumps once a day 08/11/17; both legs appear to be doing better wounds on the right lateral and left medial legs look better. Skin on the right leg quite good. He is been using silver alginate as the primary dressing. I'm going to use Anasept gel calcium alginate and maintain all the secondary dressings 08/18/17; the patient continues to actually do quite well. The area on his right lateral leg is just about closed the left medial also looks better although it is still moist in this area. His edema is well controlled we have been using Anasept gel with calcium  alginate and the usual secondary dressings, 4 layer compression and once daily use of his compression pumps "always been able to manage 09/01/17; the patient continues to do reasonably well in spite of his trip to [place]. The area on the right lateral leg is epithelialized. Left is much better but still open. He has more edema and more chronic erythema on the left leg [venous inflammation] 09/08/17; he arrives today with no open wound on the right lateral leg and decently controlled edema. Unfortunately his left leg is not nearly as in his good situation as last week.he apparently had increasing edema starting on Saturday. He edema soaked through into his foot so used a plastic bag to walk around his home. The area on the medial right leg which was his open area is about the same however he has lost surface epithelium on the left lateral which is new and he has significant pain in the Achilles area of the left foot. He is already on amoxicillin chronically for prophylaxis of cellulitis in the left leg 09/15/17; he is completed a week of doxycycline and the cellulitis in the left posterior leg and Achilles area is  as usual improved. He still has a lot of edema and fluid soaking through his dressings. There is no open wound on the right leg. He saw infectious disease NP today 09/22/17;As usual 1 we transition him from our compression wraps to his stockings things did not go well. He has several small open areas on the right leg. He states this was caused by the compression wrap on his skin although he did not wear this with the stockings over them. He has several superficial areas on the left leg medially laterally posteriorly. He does not have any evidence of active cellulitis especially involving the left Achilles The patient is traveling from [facility] Saturday going to [facility] He states he isn't attempting to get an appointment with a heel objects wound center there to change his dressings. I am not completely certain whether this will work 10/06/17; the patient came in on Friday for a nurse visit and the nurse reported that his legs actually look quite good. He arrives in clinic today for his regular follow-up visit. He has a new wound on his left third toe over the PIP probably caused by friction with his footwear. He has small areas on the left leg and a very superficial but epithelialized area on the right anterior lateral lower leg. Other than that his legs look as good as I've seen him in quite some time. We have been using silver alginate Review of systems; no chest pain no shortness of breath other than this a 10 point review of systems negative 10/20/17; seen by Dr. Meyer Russel last week. He had taken some antibiotics [doxycycline] that he had left over. Dr. Meyer Russel thought he had candida infection and declined to give him further antibiotics. He has a small wound remaining on the right lateral leg several areas on the left leg including a larger area on the left posterior several left medial and anterior and a small wound on the left lateral. The area on the left dorsal third toe looks a lot  better. ROS; Gen.; no fever, respiratory no cough no sputum Cardiac no chest pain other than this 10 point review of system is negative 10/30/17; patient arrives today having fallen in the bathtub 3 days ago. It took him a while to get up. He has pain and maceration in the wounds on  his left leg which have deteriorated. He has not been using his pumps he also has some maceration on the right lateral leg. 11/03/17; patient continues to have weeping edema especially in the left leg. This saturates his dressings which were just put on on 12/27. As usual the doxycycline seems to take care of the cellulitis on his lower leg. He is not complaining of fever, chills, or other systemic symptoms. He states his leg feels a lot better on the doxycycline I gave him empirically. He also apparently gets injections at his primary doctor's officeo Rocephin for cellulitis prophylaxis. I didn't ask him about his compression pump compliance today I think that's probably marginal. Arrives in the clinic with all of his dressings primary and secondary macerated full of fluid and he has bilateral edema 11/10/17; the patient's right leg looks some better although there is still a cluster of wounds on the right lateral. The left leg is inflamed with almost circumferential skin loss medially to laterally although we are still maintaining anteriorly. He does not have overt cellulitis there is a lot of drainage. He is not using compression pumps. We have been using silver alginate to the wound areas, there are not a lot of options here 11/17/17; the patient's right leg continues to be stable although there is still open wounds, better than last week. The inflammation in the left leg is better. Still loss of surface layer epithelium especially posteriorly. There is no overt cellulitis in the amount of edema and his left leg is really quite good, tells me he is using his compression pumps once a day. 11/24/17; patient's right leg has  a small superficial wound laterally this continues to improve. The inflammation in the left leg is still improving however we have continuous surface layer epithelial loss posteriorly. There is no overt cellulitis in the amount of edema in both legs is really quite good. He states he is using his compression pumps on the left leg once a day for 5 out of 7 days 12/01/17; very small superficial areas on the right lateral leg continue to improve. Edema control in both legs is better today. He has continued loss of surface epithelialization and left posterior calf although I think this is better. We have been using silver alginate with large number of absorptive secondary dressings 4 layer on the left Unna boot on the right at his request. He tells me he is using his compression pumps once a day 12/08/17; he has no open area on the right leg is edema control is good here. ooOn the left leg however he has marked erythema and tenderness breakdown of skin. He has what appears to be a wrap injury just distal to the popliteal fossa. 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 There was swelling erythema and breakdown in the right leg. He has much worse skin breakdown in the left leg as well multiple open areas medially and posteriorly as well as laterally. He tells me he has been using his compression pumps but tells me he feels that the drainage out of his leg is worse when he uses a compression pumps. T be fair to him he is been saying this o for a while however I  don't know that I have really been listening to this. I wonder if the compression pumps are working properly 12/22/17;. Once again he arrives with severe erythema, weeping  edema from the left greater than right leg. Noncompliance with compression pumps. New this visit he is complaining of pain on the lateral aspect of the right leg and the medial aspect of his right thigh. He apparently saw his cardiologist Dr. [name] who was ordered an echocardiogram area and I think this is a step in the right direction 12/25/17; started his doxycycline Monday night. There is still intense erythema of the right leg especially in the anterior thigh although there is less tenderness. The erythema around the wound on the right lateral calf also is less tender. He still complaining of pain in the left heel. His wounds are about the same right lateral left medial left lateral. Superficial but certainly not close to closure. He denies being systemically unwell no fever chills no abdominal pain no diarrhea 12/29/17; back in follow-up of his extensive right calf and right thigh cellulitis. I added amoxicillin to cover possible doxycycline resistant strep. This seems to of done the trick he is in much less pain there is much less erythema and swelling. He has his echocardiogram at 11:00 this morning. X-ray of the left heel was also negative. 01/05/18; the patient arrived with his edema under much better control. Now that he is retired he is able to use his compression pumps daily and sometimes twice a day per the patient. He has a wound on the right leg the lateral wound looks better. Area on the left leg also looks a lot better. He has no evidence of cellulitis in his bilateral thighs I had a quick peak at his echocardiogram. He is in normal ejection fraction and normal left ventricular function. He has moderate pulmonary hypertension moderately reduced right ventricular function. One would have to wonder about chronic sleep apnea although he says he doesn't snore. He'll review the echocardiogram with his cardiologist. 01/12/18; the patient arrives with the edema in both legs under exemplary  control. He is using his compression pumps daily and sometimes twice daily. His wound on the right lateral leg is just about closed. He still has some weeping areas on the posterior left calf and lateral left calf although everything is just about closed here as well. I have spoken with Aldean Baker who is the patient's nurse practitioner and infectious disease. She was concerned that the patient had not understood that the parenteral penicillin injections he was receiving for cellulitis prophylaxis was actually benefiting him. I don't think the patient actually saw that I would tend to agree we were certainly dealing with less infections although he had a serious one last month. 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 He is healed. He'll be placed in juxtalite compression wraps and increase his lymphedema pumps to twice daily. We will follow up again next week to ensure there are no issues with the new regiment. 01/20/18-he is here for evaluation of bilateral lower extremity weeping edema. Yesterday he was placed in compression wrap to the right lower extremity and compression stocking to left lower shrubbery. He states he uses lymphedema pumps last night and again this morning and noted a blister to the left lower extremity. On exam he was noted to have drainage to the right lower extremity. He will be placed in Unna boots bilaterally and follow-up next week 01/26/18; patient was actually discharged a  week ago to his own juxta light stockings only to return the next day with bilateral lower extremity weeping edema.he was placed in bilateral Unna boots. He arrives today with pain in the back of his left leg. There is no open area on the right leg however there is a linear/wrap injury on the left leg and weeping edema on the left leg posteriorly. I spoke with infectious disease about 10 days ago. They were disappointed that the patient elected to discontinue  prophylactic intramuscular penicillin shots as they felt it was particularly beneficial in reducing the frequency of his cellulitis. I discussed this with the patient today. He does not share this view. He'll definitely need antibiotics today. Finally he is traveling to [place] and trauma leaving this Saturday and returning a week later and he does not travel with his pumps. He is going by car 01/30/18; patient was seen 4 days ago and brought back in today for review of cellulitis in the left leg posteriorly. I put him on amoxicillin this really hasn't helped as much as I might like. He is also worried because he is traveling to [facility] trauma by car. Finally we will be rewrapping him. There is no open area on the right leg over his left leg has multiple weeping areas as usual 02/09/18; The same wrap on for 10 days. He did not pick up the last doxycycline I prescribed for him. He apparently took 4 days worth he already had. There is nothing open on his right leg and the edema control is really quite good. He's had damage in the left leg medially and laterally especially probably related to the prolonged use of Unna boots 02/12/18; the patient arrived in clinic today for a nurse visit/wrap change. He complained of a lot of pain in the left posterior calf. He is taking doxycycline that I previously prescribed for him. Unfortunately even though he used his stockings and apparently used to compression pumps twice a day he has weeping edema coming out of the lateral part of his right leg. This is coming from the lower anterior lateral skin area. 02/16/18; the patient has finished his doxycycline and will finish the amoxicillin 2 days. The area of cellulitis in the left calf posteriorly has resolved. He is no longer having any pain. He tells me he is using his compression pumps at least once a day sometimes twice. 02/23/18; the patient finished his doxycycline and Amoxil last week. On Friday he noticed a small  erythematous circle about the size of a quarter on the left lower leg just above his ankle. This rapidly expanded and he now has erythema on the lateral and posterior part of the thigh. This is bright red. Also has an area on the dorsal foot just above his toes and a tender area just below the left popliteal fossa. He came off his prophylactic penicillin injections at his own insistence one or 2 months ago. This is obviously deteriorated since then 03/02/18; patient is on doxycycline and Amoxil. Culture I did last week of the weeping area on the back of his left calf grew group B strep. I have therefore renewed the amoxicillin 500 3 times a day for a further week. He has not been systemically unwell. Still complaining of an area of discomfort right under his left popliteal fossa. There is no open wound on the right leg. He tells me that he is using his pumps twice a day on most days 03/09/18; patient arrives in clinic today  completing his amoxicillin today. The cellulitis on his left leg is better. Furthermore he tells me that he had intramuscular penicillin shots that his primary care office today. However he also states that the wrap on his right leg fell down shortly after leaving clinic last week. He developed a large blister that was present when he came in for a nurse visit later in the week and then he developed intense discomfort around this area.He tells me he is using his compression pumps 03/16/18; the patient has completed his doxycycline. The infectious part of this/cellulitis in the left heel area left popliteal area is a lot better. He has 2 open areas on the right calf. Still areas on the left calf but this is a lot better as well. 03/24/18; the patient arrives complaining of pain in the left popliteal area again. He thinks some of this is wrap injury. He has no open area on the right leg and really no open area on the left calf either except for the popliteal area. He claims to be compliant  with the compression pumps 03/31/18; I gave him doxycycline last week because of cellulitis in the left popliteal area. This is a lot better although the surface epithelium is denuded off and response to this. He arrives today with uncontrolled edema in the right calf area as well as a fingernail injury in the right lateral calf. There is only a few open areas on the left 04/06/18; I gave him amoxicillin doxycycline over the last 2 weeks that the amoxicillin should be completing currently. He is not complaining of any pain or systemic symptoms. The only open areas see has is on the right lateral lower leg paradoxically I cannot see anything on the left lower leg. He tells me he is using his compression pumps twice a day on most days. Silver alginate to the wounds that are open under 4 layer compression 04/13/18; he completed antibiotics and has no new complaints. Using his compression pumps. Silver alginate that anything that's opened 04/20/18; he is using his compression pumps religiously. Silver alginate 4 layer compression anything that's opened. He comes in today with no open wounds on the left leg but 3 on the right including a new one posteriorly. He has 2 on the right lateral and one on the right posterior. He likes Unna boots on the right leg for reasons that aren't really clear we had the usual 4 layer compression on the left. It may be necessary to move to the 4 layer compression on the right however for now I left them in the Unna boots 04/27/18; he is using his compression pumps at least once a day. He has still the wounds on the right lateral calf. The area right posteriorly has closed. He does not have an open wound on the left under 4 layer compression however on the dorsal left foot just proximal to the toes and the left third toe 2 small open areas were identified 05/11/18; he has not uses compression pumps. The areas on the right lateral calf have coalesced into one large wound necrotic  surface. On the left side he has one small wound anteriorly however the edema is now weeping out of a large part of his left leg. He says he wasn't using his pumps because of the weeping fluid. I explained to him that this is the time he needs to pump more 05/18/18; patient states he is using his compression pumps twice a day. The area on the right  lateral large wound albeit superficial. On the left side he has innumerable number of small new wounds on the left calf particularly laterally but several anteriorly and medially. All these appear to have healthy granulated base these look like the remnants of blisters however they occurred under compression. The patient arrives in clinic today with his legs somewhat better. There is certainly less edema, less multiple open areas on the left calf and the right anterior leg looks somewhat better as well superficial and a little smaller. However he relates pain and erythema over the last 3-4 days in the thigh and I looked at this today. He has not been systemically unwell no fever no chills no change in blood sugar values 05/25/18; comes in today in a better state. The severe cellulitis on his left leg seems better with the Keflex. Not as tender. He has not been systemically unwell ooHard to find an open wound on the left lower leg using his compression pumps twice a day ooThe confluent wounds on his right lateral calf somewhat better looking. These will ultimately need debridement I didn't do this today. 06/01/18; the severe cellulitis on the left anterior thigh has resolved and he is completed his Keflex. ooThere is no open wound on the left leg however there is a superficial excoriation at the base of the third toe dorsally. Skin on the bottom of his left foot is macerated looking. ooThe left the wounds on the lateral right leg actually looks some better although he did require debridement of the top half of this wound area with an open curet 06/09/18 on  evaluation today patient appears to be doing poorly in regard to his right lower extremity in particular this appears to likely be infected he has very thick purulent discharge along with a bright green tent to the discharge. This makes me concerned about the possibility of pseudomonas. He's also having increased discomfort at this point on evaluation. Fortunately there does not appear to be any evidence of infection spreading to the other location at this time. 06/16/18 on evaluation today patient appears to actually be doing fairly well. His ulcer has actually diminished in size quite significantly at this point which is good news. Nonetheless he still does have some evidence of infection he did see infectious disease this morning before coming here for his appointment. I did review the results of their evaluation and their note today. They did actually have him discontinue the Cipro and initiate treatment with linezolid at this time. He is doing this for the next seven days and they recommended a follow-up in four months with them. He is the keep a log of the need for intermittent antibiotic therapy between now and when he falls back up with infectious disease. This will help them gaze what exactly they need to do to try and help them out. 06/23/18; the patient arrives today with no open wounds on the left leg and left third toe healed. He is been using his compression pumps twice a day. On the right lateral leg he still has a sizable wound but this is a lot better than last time I saw this. 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 Has been using silver alginate under 4 layer compression 06/30/18; the only open wound he has is on the right lateral leg and this looks healthy. No debridement is required. We have been using silver  alginate. He does not have an open wound on the left leg. There is  apparently some drainage from the dorsal proximal third toe on the left although I see no open wound here. 07/03/18 on evaluation today patient was actually here just for a nurse visit rapid change. However when he was here on Wednesday for his rat change due to having been healed on the left and then developing blisters we initiated the wrap again knowing that he would be back today for [place] to reevaluate and see were at. Unfortunately he has developed some cellulitis into the proximal portion of his right lower extremity even into the region of his thigh. 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 He was placed on one as what at that point. Nonetheless he is done with that and has been tolerating it well otherwise. Doxycycline which in the past really did not seem to be effective for him. Nonetheless I think the best option may be for [place] to definitely reinitiate the antibiotics for a longer period of time. 07/07/18; since I last saw this patient a week ago he has had a difficult time. At that point he did not have an open wound on his left leg. We transitioned him into juxta light stockings. He was apparently in the clinic the next day with blisters on the left lateral and left medial lower calf. He also had weeping edema fluid. He was put back into a compression wrap. He was also in the clinic on Friday with intense erythema in his right thigh. Per the patient he was started on Bactrim however that didn't work at all in terms of relieving his pain and swelling. He has taken 3 doxycycline that he had left over from last time and that seems to of helped. He has blistering on the right thigh as well. 07/14/18; the erythema on his right thigh has gotten better with doxycycline that he is finishing. 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 Presumably this cellulitis in the thigh was not  related to that although I think this is a bit concerning going forward. He still has an area on the right lateral calf the blister on the right medial calf just below the knee that was discussed above. On the left 2 small open areas left medial and left lateral. Edema control is adequate. He is using his compression pumps twice a day 07/20/18; continued improvement in the condition of both legs especially the edema in his bilateral thighs. He tells me he is been losing weight through a combination of diet and exercise. He is using his compression pumps twice a day. So overall she made to the remaining wounds 07/27/2018; continued improvement in condition of both legs. His edema is well controlled. The area on the right lateral leg is just about closed he had one blisters show up on the medial left upper calf. We have him in 4 layer compression. He is going on a 10-day trip to [place], [place] and [place]. He will be driving. He wants to wear Unna boots because of the lessening amount of constriction. He will not use compression pumps while he is away 08/05/18 on evaluation today patient actually appears to be doing decently well all things considered in regard to his bilateral lower extremities. The worst ulcer is actually only posterior aspect of his left lower  extremity with a four layer compression wrap cut into his leg a couple weeks back. He did have a trip and actually had [employer] for the trip that he is worn since he was last here. Nonetheless he feels like the [employer] actually do better for him his swelling is up a little bit but he also with his trip was not taking his Lasix on a regular set schedule like he was supposed to be. He states that obviously the reason being that he cannot drive and keep going without having to urinate too frequently which makes it difficult. He did not have his pumps with him while he was away either which I think also maybe playing a role here  too. 08/13/2018; the patient only has a small open wound on the right lateral calf which is a big improvement in the last month or 2. He also has the area posteriorly just below the posterior fossa on the left which I think was a wrap injury from several weeks ago. He has no current evidence of cellulitis. He tells me he is back into his compression pumps twice a day. He also tells me that while he was at the laundromat somebody stole a section of his extremitease stockings 08/20/2018; back in the clinic with a much improved state. He only has small areas on the right lateral mid calf which is just about healed. This was is more substantial area for quite a prolonged period of time. He has a small open area on the left anterior tibia. The area on the posterior calf just below the popliteal fossa is closed today. He is using his compression pumps twice a day 08/28/2018; patient has no open wound on the right leg. He has a smattering of open areas on the calf with some weeping lymphedema. More problematically than that it looks as though his wraps of slipped down in his usual he has very angry upper area of edema just below the right medial knee and on the right lateral calf. He has no open area on his feet. The patient is traveling to [facility] next week. I will send him in an antibiotic. We will continue to wrap the right leg. We ordered extremitease stockings for him last week and I plan to transition the right leg to a stocking when he gets home which will be in 10 days time. As usual he is very reluctant to take his pumps with him when he travels 09/07/2018; patient returns from [facility]. He shows me a picture of his left leg in the mid part of his trip last week with intense fire engine erythema. The picture look bad enough I would have considered sending him to the hospital. Instead he went to the wound care center in [facility]. They did not prescribe him antibiotics but he did  take some doxycycline he had leftover from a previous visit. I had given him trimethoprim sulfamethoxazole before he left this did not work according to the patient. This is resulted in some improvement fortunately. He comes back with a large wound on the left posterior calf. Smaller area on the left anterior tibia. Denuded blisters on the dorsal left foot over his toes. Does not have much in the way of wounds on the right leg although he does have a very tender area on the right posterior area just below the popliteal fossa also suggestive of infection. He promises me he is back on his pumps twice  a day 09/15/2018; the intense cellulitis in his left lower calf is a lot better. The wound area on the posterior left calf is also so better. However he has reasonably extensive wounds on the dorsal aspect of his second and third toes and the proximal foot just at the base of the toes. There is nothing open on the right leg 09/22/2018; the patient has excellent edema control in his legs bilaterally. He is using his external compression pumps twice a day. He has no open area on the right leg and only the areas in the left foot dorsally second and third toe area on the left side. He does not have any signs of active cellulitis. 10/06/2018; the patient has good edema control bilaterally. He has no open wound on the right leg. There is a blister in the posterior aspect of his left calf that we had to deal with today. He is using his compression pumps twice a day. There is no signs of active cellulitis. We have been using silver alginate to the wound areas. He still has vulnerable areas on the base of his left first second toes dorsally He has a his extremities stockings and we are going to transition him today into the stocking on the right leg. He is cautioned that he will need to continue to use the compression pumps twice a day. If he notices uncontrolled edema in the right leg he may need to go to 3 times a  day. 10/13/2018; the patient came in for a nurse check on Friday he has a large flaccid blister on the right medial calf just below the knee. We unroofed this. He has this and a new area underneath the posterior mid calf which was undoubtedly a blister as well. He also has several small areas on the right which is the area we put his extremities stocking on. 10/19/2018; the patient went to see infectious disease this morning I am not sure if that was a routine follow-up in any case the doxycycline I had given him was discontinued and started on linezolid. He has not started this. It is easy to look at his left calf and the inflammation and think this is cellulitis however he is very tender in the tissue just below the popliteal fossa and I have no doubt that there is infection going on here. He states the problem he is having is that with the compression pumps the edema goes down and then starts walking the wrap falls down. We will see if we can adhere this. He has 1 or 2 minuscule open areas on the right still areas that are weeping on the posterior left calf, the base of his left second and third toes 10/26/18; back today in clinic with quite of skin breakdown in his left anterior leg. This may have been infection the area below the popliteal fossa seems a lot better however tremendous epithelial loss on the left anterior mid tibia area over quite inexpensive tissue. He has 2 blisters on the right side but no other open wound here. 10/29/2018; came in urgently to see [place] today and we worked him in for review. He states that the 4 layer compression on the right leg caused pain he had to cut it down to roughly his mid calf this caused swelling above the wrap and he has blisters and skin breakdown today. As a result of the pain he has not been using his pumps. Both legs are a lot more edematous and there  is a lot of weeping fluid. 11/02/18; arrives in clinic with continued difficulties in the right leg>  left. Leg is swollen and painful. multiple skin blisters and new open areas especially laterally. He has not been using his pumps on the right leg. 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 He is not systemically unwell. 11/09/2018; the patient claims he is being compliant with his pumps. He is finished the doxycycline I gave him last week. Culture I did of the wound on the right lateral leg showed a few very resistant methicillin staph aureus. This was resistant to doxycycline. Nevertheless he states the pain in the leg is a lot better which makes me wonder if the cultured organism was not really what was causing the problem nevertheless this is a very dangerous organism to be culturing out of any wound. His right leg is still a lot larger than the left. He is using an Radio broadcast assistant on this area, he blames a 4-layer compression for causing the original skin breakdown which I doubt is true however I cannot talk him out of it. We have been using silver alginate to all of these areas which were initially blisters 11/16/2018; patient is being compliant with his external compression pumps at twice a day. Miraculously he arrives in clinic today with absolutely no open wounds. He has better edema control on the left where he has been using 4 layer compression versus wound of wounds on the right and I pointed this out to him. There is no inflammation in the skin in his lower legs which is also somewhat unusual for him. There is no open wounds on the dorsal left foot. He has extremitease stockings at home and I have asked him to bring these in next week. 11/25/18 patient's lower extremity on examination today on the left appears for the most part to be wound free. He does have an open wound on the lateral aspect of the right lower extremity but this is minimal compared to what I've seen in past. He does request that we go ahead and wrap the left leg as well even though  there's nothing open just so hopefully it will not reopen in short order. 1/28; patient has superficial open wounds on the right lateral calf left anterior calf and left posterior calf. His edema control is adequate. 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 We have been using silver alginate as the primary dressing. He claims compliance with his compression pumps 2/4; patient has superficial open wounds on numerous areas of his left calf and again one on the left dorsal foot. The areas on the right lateral calf have healed. The cellulitis that I gave him doxycycline for last week is also resolved this was mostly on the left anterior calf just below the tibial tuberosity. His edema looks fairly well-controlled. He tells me he went to see his primary doctor today and had blood work ordered 2/11; once again he has several open areas on the left calf left tibial area. Most of these are small and appear to have healthy granulation. He does not have anything open on the right. The edema and control in his thighs is pretty good which is usually a good indication he has been using his pumps as requested. 2/18; he continues to have several small areas on the left calf and left tibial area. Most of  these are small healthy granulation. We put him in his stocking on the right leg last week and he arrives with a superficial open area over the right upper tibia and a fairly large area on the right lateral tibia in similar condition. His edema control actually does not look too bad, he claims to be using his compression pumps twice a day 2/25. Continued small areas on the left calf and left tibial area. New areas especially on the right are identified just below the tibial tuberosity and on the right upper tibia itself. There are also areas of weeping edema fluid even without an obvious wound. He does not have a considerable degree  of lymphedema but clearly there is more edema here than his skin can handle. He states he is using the pumps twice a day. We have an Unna boot on the right and 4 layer compression on the left. 3/3; he continues to have an area on the right lateral calf and right posterior calf just below the popliteal fossa. There is a fair amount of tenderness around the wound on the popliteal fossa but I did not see any evidence of cellulitis, could just be that the wrap came down and rubbed in this area. ooHe does not have an open area on the left leg however there is an area on the left dorsal foot at the base of the third toe ooWe have been using silver alginate to all wound areas 3/10; he did not have an open area on his left leg last time he was here a week ago. T oday he arrives with a horizontal wound just below the tibial tuberosity and an area on the left lateral calf. He has intense erythema and tenderness in this area. The area is on the right lateral calf and right posterior calf better than last week. We have been using silver alginate as usual 3/18 - Patient returns with 3 small open areas on left calf, and 1 small open area on right calf, the skin looks ok with no significant erythema, he continues the UNA boot on right and 4 layer compression on left. The right lateral calf wound is closed , the right posterior is small area. we will continue silver alginate to the areas. 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 He is been tolerating the dressing changes without complication. Fortunately she has made excellent improvement in regard to the overall status of his wounds. Unfortunately every time we cease wrapping him he ends up reopening in causing more significant issues at that point. Again I'm unsure  of the best direction to take although I think the lymphedema clinic may be appropriate for him. 02/03/19 on evaluation today patient appears to be doing well in regard to the wounds that we saw him for last week unfortunately he has a new area on the proximal portion of his right medial/posterior lower extremity where the wrap somewhat slowed down and caused swelling and a blister to rub and open. Unfortunately this is the only opening that he has on either leg at this point. 02/17/19 on evaluation today patient's bilateral lower extremities appear to be doing well. He still completely healed in regard to the left lower extremity. In regard to the right lower extremity the area where the wrap and slid down and caused  the blister still seems to be slightly open although this is dramatically better than during the last evaluation two weeks ago. I'm very pleased with the way this stands overall. 03/03/19 on evaluation today patient appears to be doing well in regard to his right lower extremity in general although he did have a new blister open this does not appear to be showing any evidence of active infection at this time. Fortunately there's No fevers, chills, nausea, or vomiting noted at this time. Overall I feel like he is making good progress it does feel like that the right leg will we perform the [employer] seems to do with a bit better than three layer wrap on the left which slid down on him. We may switch to doing bilateral in the book wraps. 5/4; I have not seen Mr. Marasco in quite some time. According to our case manager he did not have an open wound on his left leg last week. He had 1 remaining wound on the right posterior medial calf. He arrives today with multiple openings on the left leg probably were blisters and/or wrap injuries from Unna boots. I do not think the Unna boot's will provide adequate compression on the left. I am also not clear about the frequency he is using the  compression pumps. 03/17/19 on evaluation today patient appears to be doing excellent in regard to his lower extremities compared to last week's evaluation apparently. He had gotten significantly worse last week which is unfortunate. The [employer] wrap on the left did not seem to do very well for him at all and in fact it didn't control his swelling significantly enough he had an additional outbreak. Subsequently we go back to the four layer compression wrap on the left. This is good news. At least in that he is doing better and the wound seem to be killing him. He still has not heard anything from the lymphedema clinic. 03/24/19 on evaluation today patient actually appears to be doing much better in regard to his bilateral lower Trinity as compared to last week when I saw him. Fortunately there's no signs of active infection at this time. He has been tolerating the dressing changes without complication. Overall I'm extremely pleased with the progress and appearance in general. 04/07/19 on evaluation today patient appears to be doing well in regard to his bilateral lower extremities. His swelling is significantly down from where it was previous. With that being said he does have a couple blisters still open at this point but fortunately nothing that seems to be too severe and again the majority of the larger openings has healed at this time. 04/14/19 on evaluation today patient actually appears to be doing quite well in regard to his bilateral lower extremities in fact I'm not even sure there's anything significantly open at this time at any site. Nonetheless he did have some trouble with these wraps where they are somewhat irritating him secondary to the fact that he has noted that the graph wasn't too close down to the end of this foot in a little bit short as well up to his knee. Otherwise things seem to be doing quite well. 04/21/19 upon evaluation today patient's wound bed actually showed evidence of  being completely healed in regard to both lower extremities which is excellent news. There does not appear to be any signs of active infection which is also good news. I'm very pleased in this regard. No fevers, chills, nausea, or vomiting noted at this  time. 04/28/19 on evaluation today patient appears to be doing a little bit worse in regard to both lower extremities on the left mainly due to the fact that when he went infection disease the wrap was not wrapped quite high enough he developed a blister above this. On the right he is a small open area of nothing too significant but again this is continuing to give him some trouble he has been were in the Velcro compression that he has at home. 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 He's been tolerating the dressing changes without complication. Fortunately there's no signs of active infection at this time. No fevers, chills, nausea, or vomiting noted at this time. We have been trying to get an appointment with her lymphedema clinic in [facility] but unfortunately nobody can get them on phone with not been able to even fax information over the patient likewise is not been able to get in touch with them. Overall I'm not sure exactly what's going on here with to reach out again today. 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 Still having a lot of drainage unfortunately. He tells me especially in the left but even on the right. There's no signs of active infection which is good news we've been using so ratcheted up to this point. 05/19/19 on evaluation today patient actually appears to be doing quite well with regard to his left lower extremity which is great news. Fortunately in regard to the right lower extremity has an issues with his wrap and he subsequently did remove this from what I'm  understanding. Nonetheless long story short is what he had rewrapped once he removed it subsequently had maggots underneath this wrap whenever he came in for evaluation today. With that being said they were obviously completely cleaned away by the nursing staff. The visit today which is excellent news. However he does appear to potentially have some infection around the right ankle region where the maggots were located as well. He will likely require anabiotic therapy today. 05/26/19 on evaluation today patient actually appears to be doing much better in regard to his bilateral lower extremities. I feel like the infection is under much better control. With that being said there were maggots noted when the wrap was removed yet again today. Again this could have potentially been left over from previous although at this time there does not appear to be any signs of significant drainage there was obviously on the wrap some drainage as well this contracted gnats or otherwise. Either way I do not see anything that appears to be doing worse in my pinion and in fact I think his drainage has slowed down quite significantly likely mainly due to the fact to his infection being under better control. 06/02/2019 on evaluation today patient actually appears to be doing well with regard to his bilateral lower extremities there is no signs of active infection at this time which is great news. With that being said he does have several open areas more so on the right than the left but nonetheless these are all significantly better than previously noted. 06/09/2019 on evaluation today patient actually appears to be doing well. His wrap stayed up and he did not cause any problems he had more drainage on the right compared to the left but overall I do not see any major issues  at this time which is great news. 06/16/2019 on evaluation today patient appears to be doing excellent with regard to his lower extremities the only area  that is open is a new blister that can have opened as of today on the medial ankle on the left. Other than this he really seems to be doing great I see no major issues at this point. 06/23/2019 on evaluation today patient appears to be doing quite well with regard to his bilateral lower extremities. In fact he actually appears to be almost completely healed there is a small area of weeping noted of the right lower extremity just above the ankle. Nonetheless fortunately there is no signs of active infection at this time which is good news. No fevers, chills, nausea, vomiting, or diarrhea. 8/24; the patient arrived for a nurse visit today but complained of very significant pain in the left leg and therefore I was asked to look at this. Noted that he did not have an open area on the left leg last week nevertheless this was wrapped. The patient states that he is not been able to put his compression pumps on the left leg because of the discomfort. He has not been systemically unwell 06/30/2019 on evaluation today patient unfortunately despite being excellent last week is doing much worse with regard to his left lower extremity today. In fact he had to come in for a nurse on Monday where his left leg had to be rewrapped due to excessive weeping Dr. Leanord Hawking placed him on doxycycline at that point. Fortunately there is no signs of active infection Systemically at this time which is good news. 07/07/2019 in regard to the patient's wounds today he actually seems to be doing well with his right lower extremity there really is nothing open or draining at this point this is great news. Unfortunately the left lower extremity is given him additional trouble at this time. There does not appear to be any signs of active infection nonetheless he does have a lot of edema and swelling noted at this point as well as blistering all of which has led to a much more poor appearing leg at this time compared to where it was 2 weeks  ago when it was almost completely healed. Obviously this is a little discouraging for the patient. He is try to contact the lymphedema clinic in [place] he has not been able to get through to them. 07/14/2019 on evaluation today patient actually appears to be doing slightly better with regard to his left lower extremity ulcers. Overall I do feel like at least at the top of the wrap that we have been placing this area has healed quite nicely and looks much better. The remainder of the leg is showing signs of improvement. Unfortunately in the thigh area he still has an open region on the left and again on the right he has been utilizing just a Band-Aid on an area that also opened on the thigh. Again this is an area that were not able to wrap although we did do an Ace wrap to provide some compression that something that obviously is a little less effective than the compression wraps we have been using on the lower portion of the leg. He does have an appointment with the lymphedema clinic in [facility] on Friday. 07/21/2019 on evaluation today patient appears to be doing better with regard to his lower extremity ulcers. He has been tolerating the dressing changes without complication. Fortunately there is no signs of  active infection at this time. No fevers, chills, nausea, vomiting, or diarrhea. I did receive the paperwork from the physical therapist at the lymphedema clinic in [place]. Subsequently I signed off on that this morning and sent that back to him for further progression with the treatment plan. 07/28/2019 on evaluation today patient appears to be doing very well with regard to his right lower extremity where I do not see any open wounds at this point. Fortunately he is feeling great as far as that is concerned as well. In regard to the left lower extremity he has been having issues with still several areas of weeping and edema although the upper leg is doing better his lower leg  still I think is going require the compression wrap at this time. No fevers, chills, nausea, vomiting, or diarrhea. 08/04/2019 on evaluation today patient unfortunately is having new wounds on the right lower extremity. Again we have been using Unna boot wrap on that side. We switched him to using his juxta lite wrap at home. With that being said he tells me he has been using it although his legs extremely swollen and to be honest really does not appear that he has been. I cannot know that for sure however. Nonetheless he has multiple new wounds on the right lower extremity at this time. Obviously we will have to see about getting this rewrapped for him today. 08/11/2019 on evaluation today patient appears to be doing fairly well with regard to his wounds. He has been tolerating the dressing changes including the compression wraps without complication. He still has a lot of edema in his upper thigh regions bilaterally he is supposed to be seeing the lymphedema clinic on the 15th of this month once his wraps arrive for the upper part of his legs. 08/18/2019 on evaluation today patient appears to be doing well with regard to his bilateral lower extremities at this point. He has been tolerating the dressing changes without complication. Fortunately there is no signs of active infection which is also good news. He does have a couple weeping areas on the first and second toe of the right foot he also has just a small area on the left foot upper leg and a small area on the left lower leg but overall he is doing quite well in my opinion. He is supposed to be getting his wraps shortly in fact tomorrow and then subsequently is seeing the lymphedema clinic next Wednesday on the 21st. Of note he is also leaving on the 25th to go on vacation for a week to the beach. For that reason and since there is some uncertainty about what there can be doing at lymphedema clinic next Wednesday I am get a make an appointment for  next Friday here for [place] to see what we need to do for him prior to him leaving for vacation. 10/23; patient arrives in considerable pain predominantly in the upper posterior calf just distal to the popliteal fossa also in the wound anteriorly above the major wound. This is probably cellulitis and he has had this recurrently in the past. He has no open wound on the right side and he has had an Radio broadcast assistant in that area. Finally I note that he has an area on the left posterior calf which by enlarge is mostly epithelialized. This protrudes beyond the borders of the surrounding skin in the setting of dry scaly skin and lymphedema. The patient is leaving for [facility] on Sunday. Per his longstanding  pattern, he will not take his compression pumps with him predominantly out of fear that they will be stolen. He therefore asked that we put a Unna boot back on the right leg. He will also contact the wound care center in [facility] to see if they can change his dressing in the mid week. 11/3; patient returned from his vacation to [facility]. He was seen on 1 occasion at their wound care center. They did a 2 layer compression system as they did not have our 4-layer wrap. I am not completely certain what they put on the wounds. They did not change the Unna boot on the right. The patient is also seeing a lymphedema specialist physical therapist in [place]. It appears that he has some compression sleeve for his thighs which indeed look quite a bit better than I am used to seeing. He pumps over these with his external compression pumps. 11/10; the patient has a new wound on the right medial thigh otherwise there is no open areas on the right. He has an area on the left leg posteriorly anteriorly and medially and an area over the left second toe. We have been using silver alginate. He thinks the injury on his thigh is secondary to friction from the compression sleeve he has. 11/17; the patient has a new  wound on the right medial thigh last week. He thinks this is because he did not have a underlying stocking for his thigh juxta lite apparatus. He now has this. The area is fairly large and somewhat angry but I do not think he has underlying cellulitis. ooHe has a intact blister on the right anterior tibial area. ooSmall wound on the right great toe dorsally ooSmall area on the medial left calf. 11/30; the patient does not have any open areas on his right leg and we did not take his juxta lite stocking off. However he states that on Friday his compression wrap fell down lodging around his upper mid calf area. As usual this creates a lot of problems for him. He called urgently today to be seen for a nurse visit however the nurse visit turned into a provider visit because of extreme erythema and pain in the left anterior tibia extending laterally and posteriorly. The area that is problematic is extensive 10/06/2019 upon evaluation today patient actually appears to be doing poorly in regard to his left lower extremity. He Dr. Leanord Hawking did place him on doxycycline this past Monday apparently due to the fact that he was doing much worse in regard to this left leg. Fortunately the doxycycline does seem to be helping. Unfortunately we are still having a very difficult time getting his edema under any type of control in order to anticipate discharge at some point. The only way were really able to control his lymphedema really is with compression wraps and that has only even seemingly temporary. He has been seeing a lymphedema clinic they are trying to help in this regard but still this has been somewhat frustrating in general for the patient. 10/13/19 on evaluation today patient appears to be doing excellent with regard to his right lower extremity as far as the wounds are concerned. His swelling is still quite extensive unfortunately. He is still having a lot of drainage from the thigh areas bilaterally which  is unfortunate. He's been going to lymphedema clinic but again he still really does not have this edema under control as far as his lower extremities are concern. With regard to his  left lower extremity this seems to be improving and I do believe the doxycycline has been of benefit for him. He is about to complete the doxycycline. 10/20/2019 on evaluation today patient appears to be doing poorly in regard to his bilateral lower extremities. More in the right thigh he has a lot of irritation at this site unfortunately. In regard to the left lower extremity the wrap was not quite as high it appears and does seem to have caused him some trouble as well. Fortunately there is no evidence of systemic infection though he does have some blue-green drainage which has me concerned for the possibility of Pseudomonas. He tells me he is previously taking Cipro without complications and he really does not care for Levaquin however due to some of the side effects he has. He is not allergic to any medications specifically antibiotics that were aware of. 10/27/2019 on evaluation today patient actually does appear to be for the most part doing better when compared to last week's evaluation. With that being said he still has multiple open wounds over the bilateral lower extremities. He actually forgot to start taking the Cipro and states that he still has the whole bottle. He does have several new blisters on left lower extremity today I think I would recommend he go ahead and take the Cipro based on what I am seeing at this point. 12/30-Patient comes at 1 week visit, 4 layer compression wraps on the left and Unna boot on the right, primary dressing Xtrasorb and silver alginate. Patient is taking his Cipro and has a few more days left probably 5-6, and the legs are doing better. He states he is using his compressions devices which I believe he has 11/10/2019 on evaluation today patient actually appears to be much better  than last time I saw him 2 weeks ago. His wounds are significantly improved and overall I am very pleased in this regard. Fortunately there is no signs of active infection at this time. He is just a couple of days away from completing Cipro. Overall his edema is much better he has been using his lymphedema pumps which I think is also helping at this point. 11/17/2019 on evaluation today patient appears to be doing excellent in regard to his wounds in general. His legs are swollen but not nearly as much as they have been in the past. Fortunately he is tolerating the compression wraps without complication. No fevers, chills, nausea, vomiting, or diarrhea. He does have some erythema however in the distal portion of his right lower extremity specifically around the forefoot and toes there is a little bit of warmth here as well. 11/24/2019 on evaluation today patient appears to be doing well with regard to his right lower extremity I really do not see any open wounds at this point. His left lower extremity does have several open areas and his right medial thigh also is open. Other than this however overall the patient seems to be making good progress and I am very pleased at this point. 12/01/2019 on evaluation today patient appears to be doing poorly at this point in regard to his left lower extremity has several new blisters despite the fact that we have him in compression wraps. In fact he had a 4-layer compression wrap, his upper thigh wrapped from lymphedema clinic, and a juxta light over top of the 4 layer compression wrap the lymphedema clinic applied and despite all this he still develop blisters underneath. Obviously this does have me concerned  about the fact that unfortunately despite what we are doing to try to get wounds healed he continues to have new areas arise I do not think he is ever good to be at the point where he can realistically just use wraps at home to keep things under control.  Typically when we heal him it takes about 1-2 days before he is back in the clinic with severe breakdown and blistering of his lower extremities bilaterally. This is happened numerous times in the past. Unfortunately I think that we may need some help as far as overall fluid overload to kind of limit what we are seeing and get things under better control. 12/08/2019 on evaluation today patient presents for follow-up concerning his ongoing bilateral lower extremity edema. Unfortunately he is still having quite a bit of swelling the compression wraps are controlling this to some degree but he did see Dr. [name] his cardiologist I do have that available for review today as far as the appointment was concerned that was on 12/06/2019. Obviously that she has been 2 days ago. The patient states that he is only been taking the Lasix 80 mg 1 time a day he had told me previously he was taking this twice a day. Nonetheless Dr. [name] recommended this be up to 80 mg 2 times a day for the patient as he did appear to be fluid overloaded. With that being said the patient states he did this yesterday and he was unable to go anywhere or do anything due to the fact that he was constantly having to urinate. Nonetheless I think that this is still good to be something that is important for him as far as trying to get his edema under control at all things that he is going to be able to just expect his wounds to get under control and things to be better without going through at least a period of time where he is trying to stabilize his fluid management in general and I think increasing the Lasix is likely the first step here. It was also mentioned the possibility that the patient may require metolazone. With that being said he wanted to have the patient take Lasix twice a day first and then reevaluating 2 months to see where things stand. 12/15/2019 upon evaluation today patient appears to be doing regard to his legs although his  toes are showing some signs of weeping especially on the left at this point to some degree on the right. There does not appear to be any signs of active infection and overall I do feel like the compression wraps are doing well for him but he has not been able to take the Lasix at home and the increased dose that Dr. [name] recommended. He tells me that just not go to be feasible for him. Nonetheless I think in this case he should probably send a message to Dr. [name] in order to discuss options from the standpoint of possible admission to get the fluid off or otherwise going forward. 12/22/2019 upon evaluation today patient appears to be doing fairly well with regard to his lower extremities at this point. In fact he would be doing excellent if it was not for the fact that his right anterior thigh apparently had an allergic reaction to adhesive tape that he used. The wound itself that we have been monitoring actually appears to be healed. There is a lot of irritation at this point. 12/29/2019 upon evaluation today patient appears to be doing well  in regard to his lower extremities. His left medial thigh is open and somewhat draining today but this is the only region that is open the right has done much better with the treatment utilizing the steroid cream that I prescribed for him last week. Overall I am pleased in that regard. Fortunately there is no signs of active infection at this time. No fevers, chills, nausea, vomiting, or diarrhea. 01/05/2020 upon evaluation today patient appears to be doing more poorly in regard to his right lower extremity at this point upon evaluation today. Unfortunately he continues to have issues in this regard and I think the biggest issue is controlling his edema. This obviously is not very well controlled at this point is been recommended that he use the Lasix twice a day but he has not been able to do that. Unfortunately I think this is leading to an issue where honestly he  is not really able to effectively control his edema and therefore the wounds really are not doing significantly better. I do not think that he is going to be able to keep things under good control unless he is able to control his edema much better. I discussed this again in great detail with him today. 01/12/2020 good news is patient actually appears to be doing quite well today at this point. He does have an appointment with lymphedema clinic tomorrow. His legs appear healed and the toe on the left is almost completely healed. In general I am very pleased with how things stand at this point. 01/19/2020 upon evaluation today patient appears to actually be doing well in regard to his lower extremities there is nothing open at this point. Fortunately he has done extremely well more recently. Has been seeing lymphedema clinic as well. With that being said he has Velcro wraps for his lower legs as well as his upper legs. The only wound really is on his toe which is the right great toe and this is barely anything even there. With all that being said I think it is good to be appropriate today to go ahead and switch him over to the Velcro compression wraps. 01/26/2020 upon evaluation today patient appears to be doing worse with regard to his lower extremities after last week switch him to Velcro compression wraps. Unfortunately he lasted less than 24 hours he did not have the sock portion of his Velcro wrap on the left leg and subsequently developed a blister underneath the Velcro portion. Obviously this is not good and not what we were looking for at this point. He states the lymphedema clinic did tell him to wear the wrap for 23 hours and take him off for 1 I am okay with that plan but again right now we got a get things back under control again he may have some cellulitis noted as well. 02/02/2020 upon evaluation today patient unfortunately appears to have several areas of blistering on his bilateral lower  extremities today mainly on the feet. His legs do seem to be doing somewhat better which is good news. Fortunately there is no evidence of active infection at this time. No fevers, chills, nausea, vomiting, or diarrhea. 02/16/2020 upon evaluation today patient appears to be doing well at this time with regard to his legs. He has a couple weeping areas on his toes but for the most part everything is doing better and does appear to be sealed up on his legs which is excellent news. We can continue with wrapping him at this  point as he had every time we discontinue the wraps he just breaks out with new wounds. There is really no point in is going forward with this at this point. 03/08/2020 upon evaluation today patient actually appears to be doing quite well with regard to his lower extremity ulcers. He has just a very superficial and really almost nonexistent blister on the left lower extremity he has in general done very well with the compression wraps. With that being said I do not see any signs of infection at this time which is good news. 03/29/2020 upon evaluation today patient appears to be doing well with regard to his wounds currently except for where he had several new areas that opened up due to some of the wrap slipping and causing him trouble. He states he did not realize they had slipped. Nonetheless he has a 1 area on the right and 3 new areas on the left. Fortunately there is no signs of active infection at this time which is great news. 04/05/2020 upon evaluation today patient actually appears to be doing quite well in general in regard to his legs currently. Fortunately there is no signs of active infection at this time. No fevers, chills, nausea, vomiting, or diarrhea. He tells me next week that he will actually be seen in the lymphedema clinic on Thursday at 10 AM I see him on Wednesday next week. 04/12/2020 upon evaluation today patient appears to be doing very well with regard to his lower  extremities bilaterally. In fact he does not appear to have any open wounds at this point which is good news. Fortunately there is no signs of active infection at this time. No fevers, chills, nausea, vomiting, or diarrhea. 04/19/2020 upon evaluation today patient appears to be doing well with regard to his wounds currently on the bilateral lower extremities. There does not appear to be any signs of active infection at this time. Fortunately there is no evidence of systemic infection and overall very pleased at this point. Nonetheless after I held him out last week he literally had blisters the next morning already which swelled up with him being right back here in the clinic. Overall I think that he is just not can be able to be discharged with his legs the way they are he is much to volume overloaded as far as fluid is concerned and that was discussed with him today of also discussed this but should try the clinic nurse manager as well as Dr. Leanord Hawking. 04/26/2020 upon evaluation today patient appears to be doing better with regard to his wounds currently. He is making some progress and overall swelling is under good control with the compression wraps. Fortunately there is no evidence of active infection at this time. 05/10/2020 on evaluation today patient appears to be doing overall well in regard to his lower extremities bilaterally. He is Tolerating the compression wraps without complication and with what we are seeing currently I feel like that he is making excellent progress. There is no signs of active infection at this time. 05/24/2020 upon evaluation today patient appears to be doing well in regard to his legs. The swelling is actually quite a bit down compared to where it has been in the past. Fortunately there is no sign of active infection at this time which is also good news. With that being said he does have several wounds on his toes that have opened up at this point. 05/31/2020 upon evaluation  today patient appears to be doing  well with regard to his legs bilaterally where he really has no significant fluid buildup at this point overall he seems to be doing quite well. Very pleased in this regard. With regard to his toes these also seem to be drying up which is excellent. We have continue to wrap him as every time we tried as a transition to the juxta light wraps things just do not seem to get any better. 06/07/2020 upon evaluation today patient appears to be doing well with regard to his right leg at this point. Unfortunately left leg has a lot of blistering he tells me the wrap started to slide down on him when he tried to put his other Velcro wrap over top of it to help keep things in order but nonetheless still had some issues. 06/14/2020 on evaluation today patient appears to be doing well with regard to his lower extremity ulcers and foot ulcers at this point. I feel like everything is actually showing signs of improvement which is great news overall there is no signs of active infection at this time. No fevers, chills, nausea, vomiting, or diarrhea. 06/21/2020 on evaluation today patient actually appears to be doing okay in regard to his wounds in general. With that being said the biggest issue I see is on his right foot in particular the first and second toe seem to be doing a little worse due to the fact this is staying very wet. I think he is probably getting need to change out his dressings a couple times in between each week when we see him in regard to his toes in order to keep this drier based on the location and how this is proceeding. 06/28/2020 on evaluation today patient appears to be doing a little bit more poorly overall in regard to the appearance of the skin I am actually somewhat concerned about the possibility of him having a little bit of an infection here. We discussed the course of potentially giving him a doxycycline prescription which he is taken previously with good  result. With that being said I do believe that this is potentially mild and at this point easily fixed. I just do not want anything to get any worse. 07/12/2020 upon evaluation today patient actually appears to be making some progress with regard to his legs which is great news there does not appear to be any evidence of active infection. Overall very pleased with where things stand. 07/26/2020 upon evaluation today patient appears to be doing well with regard to his leg ulcers and toe ulcers at this point. He has been tolerating the compression wraps without complication overall very pleased in this regard. 08/09/2020 upon evaluation today patient appears to be doing well with regard to his lower extremities bilaterally. Fortunately there is no signs of active infection overall I am pleased with where things stand. Objective Constitutional Obese and well-hydrated in no acute distress. Vitals Time Taken: 11:00 AM, Height: 70 in, Weight: 380.2 lbs, BMI: 54.5, Temperature: 98.2 F, Pulse: 70 bpm, Respiratory Rate: 18 breaths/min, Blood Pressure: 159/68 mmHg. Respiratory normal breathing without difficulty. Psychiatric this patient is able to make decisions and demonstrates good insight into disease process. Alert and Oriented x 3. pleasant and cooperative. General Notes: His wound bed showed signs of excellent granulation and epithelization overall I feel like his legs are doing well with lymphedema is under good control with wraps. He did have some bleeding at the great toe on the left but again this looks like it might  have been a trauma type situation I do not see any signs right now of active infection in general but there is evidence that it was bleeding along the edge of the toe. Integumentary (Hair, Skin) Wound #183 status is Open. Original cause of wound was Blister. The wound is located on the Left T Second. The wound measures 0.6cm length x 0.7cm oe width x 0.1cm depth; 0.33cm^2 area and  0.033cm^3 volume. There is Fat Layer (Subcutaneous Tissue) exposed. There is no tunneling or undermining noted. There is a small amount of serosanguineous drainage noted. The wound margin is distinct with the outline attached to the wound base. There is small (1-33%) pink, pale granulation within the wound bed. There is a large (67-100%) amount of necrotic tissue within the wound bed including Adherent Slough. Wound #184 status is Open. Original cause of wound was Gradually Appeared. The wound is located on the Left,Anterior Lower Leg. The wound measures 0.4cm length x 0.2cm width x 0.1cm depth; 0.063cm^2 area and 0.006cm^3 volume. There is Fat Layer (Subcutaneous Tissue) exposed. There is no tunneling or undermining noted. There is a small amount of serosanguineous drainage noted. The wound margin is distinct with the outline attached to the wound base. There is large (67-100%) granulation within the wound bed. There is no necrotic tissue within the wound bed. 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 There was a Four Layer Compression Therapy Procedure by Yevonne Pax, RN. Post procedure Diagnosis Wound #184: Same as Pre-Procedure There was a Radio broadcast assistant Compression Therapy Procedure by Yevonne Pax, RN. Post procedure Diagnosis Wound #: Same as Pre-Procedure Plan Follow-up Appointments: Return Appointment in 2 weeks. - with Leonard Schwartz Nurse Visit: - 1 week Dressing Change  Frequency: Wound #183 Left T Second: oe Do not change entire dressing for one week. Wound #184 Left,Anterior Lower Leg: Do not change entire dressing for one week. Skin Barriers/Peri-Wound Care: Moisturizing lotion - both legs and feet and toes Wound Cleansing: May shower with protection. Primary Wound Dressing: Wound #183 Left T Second: oe Calcium Alginate with Silver - to any blisters or weeping areas Wound #184 Left,Anterior Lower Leg: Calcium Alginate with Silver - to any blisters or weeping areas Secondary Dressing: Wound #183 Left T Second: oe Kerlix/Rolled Gauze - secure with coban Dry Gauze Wound #184 Left,Anterior Lower Leg: Dry Gauze Edema Control: 4 layer compression: Left lower extremity Unna Boot to Right Lower Extremity Avoid standing for long periods of time Elevate legs to the level of the heart or above for 30 minutes daily and/or when sitting, a frequency of: - throughout the day Exercise regularly 1. I would suggest currently that we continue with the wound care measures as before specifically with regard to the alginate to any open areas. I think that has done a great job. 2. We will also continue with the compression wraps. Right now he is in a 4-layer compression of the left and an Unna boot on the right. 3. Also can I suggest the patient continue to elevate his legs much as possible try to keep  edema under good control exercise regularly is also recommended he is going to [employer] We will see patient back for reevaluation in 2 weeks here in the clinic. If anything worsens or changes patient will contact our office for additional recommendations. We will see him for nurse visit 1 week. Electronic Signature(s) Signed: 08/09/2020 12:00:57 PM By: Lenda Kelp PA-C Entered By: Lenda Kelp on 08/09/2020 12:00:57 -------------------------------------------------------------------------------- SuperBill Details Patient Name: Date of  Service: [name], Ithan J. 08/09/2020 Medical Record Number: [id number] Patient Account Number: [id number] Date of Birth/Sex: Treating RN: 04-29-1951 (69 y.o. [name] Primary Care Provider: Nicoletta Ba Other Clinician: Referring Provider: Treating Provider/Extender: Adele Dan in Treatment: 237 Diagnosis Coding ICD-10 Codes Code Description [id number] Non-pressure chronic ulcer of right calf limited to breakdown of skin L97.221 Non-pressure chronic ulcer of left calf limited to breakdown of skin 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 of bilateral lower extremity I89.0 Lymphedema, not elsewhere classified E11.622 Type 2 diabetes mellitus with other skin ulcer 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, unspecified L03.116 Cellulitis of left lower limb Facility Procedures CPT4 Code: 34196222 Description: (Facility Use Only) [phone] - APPLY MULTLAY COMPRS LWR LT LEG Modifier: Quantity: 1 CPT4 Code: [id number] Description: (Facility Use Only) [id number] - APPLY UNNA BOOT RT Modifier: Quantity: 1 Physician Procedures : CPT4 Code Description Modifier [id number] - WC PHYS LEVEL 3 - EST PT ICD-10 Diagnosis Description 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 skin L97.221 Non-pressure chronic ulcer of left calf limited to breakdown of  skin 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 of bilateral lower extremity I89.0 Lymphedema, not elsewhere classified Quantity: 1 Electronic Signature(s) Signed: 08/09/2020 12:01:21 PM By: Lenda Kelp PA-C Entered By: Lenda Kelp on 08/09/2020 12:01:17
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
## 2020-08-15 ENCOUNTER — Encounter: Payer: Self-pay | Admitting: Family Medicine

## 2020-08-16 ENCOUNTER — Encounter (HOSPITAL_BASED_OUTPATIENT_CLINIC_OR_DEPARTMENT_OTHER): Payer: Medicare Other | Admitting: Physician Assistant

## 2020-08-16 ENCOUNTER — Other Ambulatory Visit: Payer: Self-pay

## 2020-08-16 DIAGNOSIS — L97222 Non-pressure chronic ulcer of left calf with fat layer exposed: Secondary | ICD-10-CM | POA: Diagnosis not present

## 2020-08-16 DIAGNOSIS — I87333 Chronic venous hypertension (idiopathic) with ulcer and inflammation of bilateral lower extremity: Secondary | ICD-10-CM | POA: Diagnosis not present

## 2020-08-16 DIAGNOSIS — E1140 Type 2 diabetes mellitus with diabetic neuropathy, unspecified: Secondary | ICD-10-CM | POA: Diagnosis not present

## 2020-08-16 DIAGNOSIS — E11622 Type 2 diabetes mellitus with other skin ulcer: Secondary | ICD-10-CM | POA: Diagnosis not present

## 2020-08-16 DIAGNOSIS — L97211 Non-pressure chronic ulcer of right calf limited to breakdown of skin: Secondary | ICD-10-CM | POA: Diagnosis not present

## 2020-08-16 DIAGNOSIS — L03116 Cellulitis of left lower limb: Secondary | ICD-10-CM | POA: Diagnosis not present

## 2020-08-16 DIAGNOSIS — I890 Lymphedema, not elsewhere classified: Secondary | ICD-10-CM | POA: Diagnosis not present

## 2020-08-16 MED ORDER — HYDROCODONE-ACETAMINOPHEN 5-325 MG PO TABS
ORAL_TABLET | ORAL | 0 refills | Status: DC
Start: 2020-08-16 — End: 2020-10-02

## 2020-08-16 NOTE — Telephone Encounter (Addendum)
Requesting: HYDROcodone-acetaminophen (NORCO/VICODIN) 5-325 MG tablet Contract:04/12/20 UDS:n/a Last Visit:07/12/20 Next Visit:10/11/20 Last Refill:06/12/20 (60,0)  Please Advise, pt states he is leaving to go out of town today at noon and would like to pick up medication before leaving.

## 2020-08-16 NOTE — Progress Notes (Signed)
QUANTE, PETTRY ([id number]) Visit Report for 08/16/2020 Arrival Information Details Patient Name: Date of Service: [name], [name] 08/16/2020 10:15 A M Medical Record Number: [id number] Patient Account Number: [id number] Date of Birth/Sex: Treating RN: 1951-06-15 (69 y.o. [name] Primary Care Alvis Edgell: Nicoletta Ba Other Clinician: Referring Shyne Lehrke: Treating Kelcey Korus/Extender: Adele Dan in Treatment: 238 Visit Information History Since Last Visit Added or deleted any medications: No Patient Arrived: [name] Any new allergies or adverse reactions: No Arrival Time: 10:10 Had a fall or experienced change in No Accompanied By: self activities of daily living that may affect Transfer Assistance: Stretcher risk of falls: Patient Identification Verified: Yes Signs or symptoms of abuse/neglect since last visito No Secondary Verification Process Completed: Yes Hospitalized since last visit: No Patient Requires Transmission-Based Precautions: No Implantable device outside of the clinic excluding No Patient Has Alerts: Yes cellular tissue based products placed in the center since last visit: Has Dressing in Place as Prescribed: Yes Has Compression in Place as Prescribed: Yes Pain Present Now: No Electronic Signature(s) Signed: 08/16/2020 12:31:04 PM By: Shawn Stall Entered By: Shawn Stall on 08/16/2020 10:14:04 -------------------------------------------------------------------------------- Compression Therapy Details Patient Name: Date of Service: Kevin [name], [name] 08/16/2020 10:15 A M Medical Record Number: [id number] Patient Account Number: [id number] Date of Birth/Sex: Treating RN: 1951/03/26 (69 y.o. [name] Primary Care Marquette Piontek: Nicoletta Ba Other Clinician: Referring Lavina Resor: Treating Josey Forcier/Extender: Adele Dan in Treatment: 238 Compression Therapy Performed for Wound Assessment: Wound  #184 Left,Anterior Lower Leg Performed By: Clinician [name], RN Compression Type: Four Layer Electronic Signature(s) Signed: 08/16/2020 12:31:04 PM By: Shawn Stall Entered By: Shawn Stall on 08/16/2020 10:27:21 -------------------------------------------------------------------------------- Compression Therapy Details Patient Name: Date of Service: [name], [name] 08/16/2020 10:15 A M Medical Record Number: [id number] Patient Account Number: [id number] Date of Birth/Sex: Treating RN: [birth year] (69 y.o. [name] Primary Care Raiden Haydu: Nicoletta Ba Other Clinician: Referring Zeferino Mounts: Treating Jaquelyn Sakamoto/Extender: Adele Dan in Treatment: 238 Compression Therapy Performed for Wound Assessment: NonWound Condition Lymphedema - Right Leg Performed By: Clinician Shawn Stall, RN Compression Type: Henriette Combs Electronic Signature(s) Signed: 08/16/2020 12:31:04 PM By: Shawn Stall Entered By: Shawn Stall on 08/16/2020 10:27:39 -------------------------------------------------------------------------------- Encounter Discharge Information Details Patient Name: Date of Service: [name], [name] 08/16/2020 10:15 A M Medical Record Number: [id number] Patient Account Number: [id number] Date of Birth/Sex: Treating RN: [birth year] (69 y.o. [name] Primary Care Joycelyn Liska: Nicoletta Ba Other Clinician: Referring Trayshawn Durkin: Treating Yochanan Eddleman/Extender: Adele Dan in Treatment: [phone] Encounter Discharge Information Items Discharge Condition: Stable Ambulatory Status: Walker Discharge Destination: Home Transportation: Private Auto Accompanied By: self Schedule Follow-up Appointment: Yes Clinical Summary of Care: Electronic Signature(s) Signed: 08/16/2020 12:31:04 PM By: Shawn Stall Entered By: Shawn Stall on 08/16/2020  10:29:38 -------------------------------------------------------------------------------- Patient/Caregiver Education Details Patient Name: Date of Service: [name], [name] 10/13/2021andnbsp10:15 A M Medical Record Number: [id number] Patient Account Number: [id number] Date of Birth/Gender: Treating RN: 1950/11/30 (69 y.o. [name] Primary Care Physician: Nicoletta Ba Other Clinician: Referring Physician: Treating Physician/Extender: Adele Dan in Treatment: [phone] Education Assessment Education Provided To: Patient Education Topics Provided Venous: Handouts: Managing Venous Disease and Related Ulcers Methods: Explain/Verbal Responses: Reinforcements needed Electronic Signature(s) Signed: 08/16/2020 12:31:04 PM By: Shawn Stall Entered By: Shawn Stall on 08/16/2020 10:29:20 -------------------------------------------------------------------------------- Wound Assessment Details Patient Name: Date of Service: [name], [name] 08/16/2020 10:15 A M Medical Record Number: [id number]  Patient Account Number: [id number] Date of Birth/Sex: Treating RN: 1950/11/06 (69 y.o. [name] Primary Care Pedro Whiters: Nicoletta Ba Other Clinician: Referring Crosby Bevan: Treating Kristalyn Bergstresser/Extender: Adele Dan in Treatment: 238 Wound Status Wound Number: [id number] Primary Etiology: Diabetic Wound/Ulcer of the Lower Extremity Wound Location: Left T Second oe Wound Status: Open Wounding Event: Blister Date Acquired: 07/26/2020 Weeks Of Treatment: 3 Clustered Wound: No Wound Measurements Length: (cm) 0.6 Width: (cm) 0.7 Depth: (cm) 0.1 Area: (cm) 0.33 Volume: (cm) 0.033 % Reduction in Area: 47.5% % Reduction in Volume: 47.6% Wound Description Classification: Grade 2 Treatment Notes Wound #183 (Left Toe Second) 1. Cleanse With Wound Cleanser Soap and water 3. Primary Dressing Applied Calcium Alginate Ag 4.  Secondary Dressing Other secondary dressing (specify in notes) Notes coban lightly wrapped to secure dressing in place. Electronic Signature(s) Signed: 08/16/2020 12:31:04 PM By: Shawn Stall Entered By: Shawn Stall on 08/16/2020 10:26:24 -------------------------------------------------------------------------------- Wound Assessment Details Patient Name: Date of Service: [name], [name] 08/16/2020 10:15 A M Medical Record Number: [id number] Patient Account Number: [id number] Date of Birth/Sex: Treating RN: 24-Feb-1951 (69 y.o. [name] Primary Care Yida Hyams: Nicoletta Ba Other Clinician: Referring Dorita Rowlands: Treating Franceen Erisman/Extender: Adele Dan in Treatment: 238 Wound Status Wound Number: [id number] Primary Etiology: Venous Leg Ulcer Wound Location: Left, Anterior Lower Leg Wound Status: Open Wounding Event: Gradually Appeared Date Acquired: 07/26/2020 Weeks Of Treatment: 3 Clustered Wound: Yes Wound Measurements Length: (cm) 0.4 Width: (cm) 0.2 Depth: (cm) 0.1 Area: (cm) 0.063 Volume: (cm) 0.006 % Reduction in Area: 98.9% % Reduction in Volume: 98.9% Wound Description Classification: Full Thickness Without Exposed Support Structu res Treatment Notes Wound #184 (Left, Anterior Lower Leg) 1. Cleanse With Wound Cleanser Soap and water 2. Periwound Care Moisturizing lotion 3. Primary Dressing Applied Calcium Alginate Ag 4. Secondary Dressing Dry Gauze 6. Support Layer Applied 4 layer compression wrap Notes unna boot first layer applied to upper portion of lower leg. stockinette. Electronic Signature(s) Signed: 08/16/2020 12:31:04 PM By: Shawn Stall Entered By: Shawn Stall on 08/16/2020 10:26:25 -------------------------------------------------------------------------------- Vitals Details Patient Name: Date of Service: [name], [name] 08/16/2020 10:15 A M Medical Record Number: [id number] Patient Account Number:  [id number] Date of Birth/Sex: Treating RN: May 12, 1951 (69 y.o. [name] Primary Care Lylianna Fraiser: Nicoletta Ba Other Clinician: Referring Cleota Pellerito: Treating Jazlin Tapscott/Extender: Adele Dan in Treatment: 238 Vital Signs Time Taken: 10:10 Temperature (F): 98.2 Height (in): 70 Pulse (bpm): 80 Weight (lbs): 380.2 Respiratory Rate (breaths/min): 22 Body Mass Index (BMI): 54.5 Blood Pressure (mmHg): 156/70 Reference Range: 80 - 120 mg / dl Electronic Signature(s) Signed: 08/16/2020 12:31:04 PM By: Shawn Stall Entered By: Shawn Stall on 08/16/2020 10:14:55

## 2020-08-23 ENCOUNTER — Encounter (HOSPITAL_BASED_OUTPATIENT_CLINIC_OR_DEPARTMENT_OTHER): Payer: Medicare Other | Admitting: Physician Assistant

## 2020-08-23 ENCOUNTER — Other Ambulatory Visit: Payer: Self-pay

## 2020-08-23 DIAGNOSIS — E1140 Type 2 diabetes mellitus with diabetic neuropathy, unspecified: Secondary | ICD-10-CM | POA: Diagnosis not present

## 2020-08-23 DIAGNOSIS — L97222 Non-pressure chronic ulcer of left calf with fat layer exposed: Secondary | ICD-10-CM | POA: Diagnosis not present

## 2020-08-23 DIAGNOSIS — I890 Lymphedema, not elsewhere classified: Secondary | ICD-10-CM | POA: Diagnosis not present

## 2020-08-23 DIAGNOSIS — L97522 Non-pressure chronic ulcer of other part of left foot with fat layer exposed: Secondary | ICD-10-CM | POA: Diagnosis not present

## 2020-08-23 DIAGNOSIS — E11622 Type 2 diabetes mellitus with other skin ulcer: Secondary | ICD-10-CM | POA: Diagnosis not present

## 2020-08-23 DIAGNOSIS — I872 Venous insufficiency (chronic) (peripheral): Secondary | ICD-10-CM | POA: Diagnosis not present

## 2020-08-23 DIAGNOSIS — L03116 Cellulitis of left lower limb: Secondary | ICD-10-CM | POA: Diagnosis not present

## 2020-08-23 DIAGNOSIS — L97211 Non-pressure chronic ulcer of right calf limited to breakdown of skin: Secondary | ICD-10-CM | POA: Diagnosis not present

## 2020-08-23 DIAGNOSIS — I87333 Chronic venous hypertension (idiopathic) with ulcer and inflammation of bilateral lower extremity: Secondary | ICD-10-CM | POA: Diagnosis not present

## 2020-08-23 NOTE — Progress Notes (Addendum)
DAINE, GUNTHER ([id number]) Visit Report for 08/23/2020 Chief Complaint Document Details Patient Name: Date of Service: CO JAIS, DEMIR 08/23/2020 10:30 A M Medical Record Number: [id number] Patient Account Number: [id number] Date of Birth/Sex: Treating RN: December 05, 1950 (69 y.o. [name] Primary Care Provider: Nicoletta Ba Other Clinician: Referring Provider: Treating Provider/Extender: Adele Dan in Treatment: 239 Information Obtained from: Patient Chief Complaint patient is here for evaluation venous/lymphedema weeping Electronic Signature(s) Signed: 08/23/2020 10:29:57 AM By: Lenda Kelp PA-C Entered By: Lenda Kelp on 08/23/2020 10:29:56 -------------------------------------------------------------------------------- HPI Details Patient Name: Date of Service: [name], Trenten J. 08/23/2020 10:30 A M Medical Record Number: [id number] Patient Account Number: [id number] Date of Birth/Sex: Treating RN: [birth year] (69 y.o. [name] Primary Care Provider: Nicoletta Ba Other Clinician: Referring Provider: Treating Provider/Extender: Adele Dan in Treatment: 239 History of Present Illness HPI Description: Referred by PCP for consultation. Patient has long standing history of BLE venous stasis, no prior ulcerations. At beginning of month, developed cellulitis and weeping. Received IM Rocephin followed by Keflex and resolved. Wears compression stocking, appr 6 months old. Not sure strength. No present drainage. 01/22/16 this is a patient who is a type II diabetic on insulin. 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 He tells me he religiously wears pressure stockings of uncertain strength. He was here with weeping edema about 8 months ago but did not have an open wound. Roughly a month ago he had a reopening on his bilateral legs. He is been using bandages and Neosporin. He  does not complain of pain. 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 Last BUN/creatinine I have is from 11/20/15 at 13 and 1.0 respectively 01/29/16; patient arrives today having tolerated the Profore wrap. He brought in his stockings and these are 18 mmHg stockings he bought from [employer]. The compression here is likely inadequate. He does not complain of pain or excessive drainage she has no systemic symptoms. The wound on the right looks improved as does the one on the left although one on the left is more substantial with still tissue at risk below the actual wound area on the bilateral posterior calf 02/05/16; patient arrives with poor edema control. He states that we did put a 4 layer compression on it last week. No weight appear 5 this. 02/12/16; the area on the posterior right Has healed. The left Has a substantial wound that has necrotic surface eschar that requires a debridement with a curette. 02/16/16;the patient called or a Nurse visit secondary to increased swelling. He had been in earlier in the week with his right leg healed. He was transitioned to is on pressure stocking on the right leg with the only open wound on the left, a substantial area on the left posterior calf. Note he has a history of severe lower extremity edema, he has a history of chronic atrial fibrillation but not heart failure per my notes but I'll need to research this. He is not complaining of chest pain shortness of breath or orthopnea. The intake nurse noted blisters on the previously closed right leg 02/19/16; this is the patient's regular visit day. I see him on Friday with escalating edema new wounds on the right leg and clear signs of at least right ventricular heart failure. I increased his Lasix to 40 twice a day.  He is returning currently in follow-up. States he is noticed a decrease in that the  edema 02/26/16 patient's legs have much less edema. There is nothing really open on the right leg. The left leg has improved condition of the large superficial wound on the posterior left leg 03/04/16; edema control is very much better. The patient's right leg wounds have healed. On the left leg he continues to have severe venous inflammation on the posterior aspect of the left leg. There is no tenderness and I don't think any of this is cellulitis. 03/11/16; patient's right leg is married healed and he is in his own stocking. The patient's left leg has deteriorated somewhat. There is a lot of erythema around the wound on the posterior left leg. There is also a significant rim of erythema posteriorly just above where the wrap would've ended there is a new wound in this location and a lot of tenderness. Can't rule out cellulitis in this area. 03/15/16; patient's right leg remains healed and he is in his own stocking. The patient's left leg is much better than last review. His major wound on the posterior aspect of his left Is almost fully epithelialized. He has 3 small injuries from the wraps. Really. Erythema seems a lot better on antibiotics 03/18/16; right leg remains healed and he is in his own stocking. The patient's left leg is much better. The area on the posterior aspect of the left calf is fully epithelialized. His 3 small injuries which were wrap injuries on the left are improved only one seems still open his erythema has resolved 03/25/16; patient's right leg remains healed and he is in his own stocking. There is no open area today on the left leg posterior leg is completely closed up. His wrap injuries at the superior aspect of his leg are also resolved. He looks as though he has some irritation on the dorsal ankle but this is fully epithelialized without evidence of infection. 03/28/16; we discharged this patient on Monday. Transitioned him into his own stocking. There were problems almost  immediately with uncontrolled swelling weeping edema multiple some of which have opened. He does not feel systemically unwell in particular no chest pain no shortness of breath and he does not feel 04/08/16; the edema is under better control with the Profore light wrap but he still has pitting edema. There is one large wound anteriorly 2 on the medial aspect of his left leg and 3 small areas on the superior posterior calf. Drainage is not excessive he is tolerating a Profore light well 04/15/16; put a Profore wrap on him last week. This is controlled is edema however he had a lot of pain on his left anterior foot most of his wounds are healed 04/22/16 once again the patient has denuded areas on the left anterior foot which he states are because his wrap slips up word. He saw his primary physician today is on Lasix 40 twice a day and states that he his weight is down 20 pounds over the last 3 months. 04/29/16: Much improved. left anterior foot much improved. He is now on Lasix 80 mg per day. Much improved edema control 05/06/16; I was hoping to be able to discharge him today however once again he has blisters at a low level of where the compression was placed last week mostly on his left lateral but also his left medial leg and a small area on the anterior part of the left foot. 05/09/16; apparently the patient went  home after his appointment on 7/4 later in the evening developing pain in his upper medial thigh together with subjective fever and chills although his temperature was not taken. The pain was so intense he felt he would probably have to call 911. However he then remembered that he had leftover doxycycline from a previous round of antibiotics and took these. By the next morning he felt a lot better. He called and spoke to one of our nurses and I approved doxycycline over the phone thinking that this was in relation to the wounds we had previously seen although they were definitely were not. The  patient feels a lot better old fever no chills he is still working. Blood sugars are reasonably controlled 05/13/16; patient is back in for review of his cellulitis on his anterior medial upper thigh. He is taking doxycycline this is a lot better. Culture I did of the nodular area on the dorsal aspect of his foot grew MRSA this also looks a lot better. 05/20/16; the patient is cellulitis on the medial upper thigh has resolved. All of his wound areas including the left anterior foot, areas on the medial aspect of the left calf and the lateral aspect of the calf at all resolved. He has a new blister on the left dorsal foot at the level of the fourth toe this was excised. No evidence of infection 05/27/16; patient continues to complain weeping edema. He has new blisterlike wounds on the left anterior lateral and posterior lateral calf at the top of his wrap levels. The area on his left anterior foot appears better. He is not complaining of fever, pain or pruritus in his feet. 05/30/16; the patient's blisters on his left anterior leg posterior calf all look improved. He did not increase the Lasix 100 mg as I suggested because he was going to run out of his 40 mg tablets. He is still having weeping edema of his toes 06/03/16; I renewed his Lasix at 80 mg once a day as he was about to run out when I last saw him. He is on 80 mg of Lasix now. I have asked him to cut down on the excessive amount of water he was drinking and asked him to drink according to his thirst mechanisms 06/12/2016 -- was seen 2 days ago and was supposed to wear his compression stockings at home but he is developed lymphedema and superficial blisters on the left lower extremity and hence came in for a review 06/24/16; the remaining wound is on his left anterior leg. He still has edema coming from between his toes. There is lymphedema here however his edema is generally better than when I last saw this. He has a history of atrial fibrillation  but does not have a known history of congestive heart failure nevertheless I think he probably has this at least on a diastolic basis. 07/01/16 I reviewed his echocardiogram from January 2017. This was essentially normal. He did not have LVH, EF of 55-60%. His right ventricular function was normal although he did have trivial tricuspid and pulmonic regurgitation. This is not audible on exam however. I increased his Lasix to do massive edema in his legs well above his knees I think in early July. He was also drinking an excessive amount of water at the time. 07/15/16; missed his appointment last week because of the Labor Day holiday on Monday. He could not get another appointment later in the week. Started to feel the wrap digging in superiorly so  we remove the top half and the bottom half of his wrap. He has extensive erythema and blistering superiorly in the left leg. Very tender. Very swollen. Edema in his foot with leaking edema fluid. He has not been systemically unwell 07/22/16; the area on the left leg laterally required some debridement. The medial wounds look more stable. His wrap injury wounds appear to have healed. Edema and his foot is better, weeping edema is also better. He tells me he is meeting with the supplier of the external compression pumps at work 08/05/16; the patient was on vacation last week in [facility]. His wrap is been on for an extended period of time. Also over the weekend he developed an extensive area of tender erythema across his anterior medial thigh. He took to doxycycline yesterday that he had leftover from a previous prescription. The patient complains of weeping edema coming out of his toes 08/08/16; I saw this patient on 10/2. He was tender across his anterior thigh. I put him on doxycycline. He returns today in follow-up. He does not have any open wounds on his lower leg, he still has edema weeping into his toes. 08/12/16; patient was seen back urgently today to  follow-up for his extensive left thigh cellulitis/erysipelas. He comes back with a lot less swelling and erythema pain is much better. I believe I gave him Augmentin and Cipro. His wrap was cut down as he stated a roll down his legs. He developed blistering above the level of the wrap that remained. He has 2 open blisters and 1 intact. 08/19/16; patient is been doing his primary doctor who is increased his Lasix from 40-80 once a day or 80 already has less edema. Cellulitis has remained improved in the left thigh. 2 open areas on the posterior left calf 08/26/16; he returns today having new open blisters on the anterior part of his left leg. He has his compression pumps but is not yet been shown how to use some vital representative from the supplier. 09/02/16 patient returns today with no open wounds on the left leg. Some maceration in his plantar toes 09/10/2016 -- Dr. Leanord Hawking had recently discharged him on 09/02/2016 and he has come right back with redness swelling and some open ulcers on his left lower extremity. He says this was caused by trying to apply his compression stockings and he's been unable to use this and has not been able to use his lymphedema pumps. He had some doxycycline leftover and he has started on this a few days ago. 09/16/16; there are no open wounds on his leg on the left and no evidence of cellulitis. 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 He does not complain of symptoms here. I am not clear use using his external compression pumps. 09/23/16; I have not seen this patient in 2 weeks. He canceled his appointment 10 days ago as he was going on vacation. He tells me that on Monday he noticed a large area on his posterior left leg which is been draining copiously and is reopened into a large wound. He is been using ABDs and the external part of his juxtalite, according to our nurse this was not on properly. 10/07/16;  Still a substantial area on the posterior left leg. Using silver alginate 10/14/16; in general better although there is still open area which looks healthy. Still using silver alginate. He reminds me that this happen before he left for [facility].  T oday while he was showering in the morning. He had been using his juxtalite's 10/21/16; the area on his posterior left leg is fully epithelialized. However he arrives today with a large area of tender erythema in his medial and posterior left thigh just above the knee. I have marked the area. Once again he is reluctant to consider hospitalization. I treated him with oral antibiotics in the past for a similar situation with resolution I think with doxycycline however this area it seems more extensive to me. He is not complaining of fever but does have chills and says states he is thirsty. His blood sugar today was in the 140s at home 10/25/16 the area on his posterior left leg is fully epithelialized although there is still some weeping edema. The large area of tenderness and erythema in his medial and posterior left thigh is a lot less tender although there is still a lot of swelling in this thigh. He states he feels a lot better. He is on doxycycline and Augmentin that I started last week. This will continued until Tuesday, December 26. I have ordered a duplex ultrasound of the left thigh rule out DVT whether there is an abscess something that would need to be drained I would also like to know. 11/01/16; he still has weeping edema from a not fully epithelialized area on his left posterior calf. Most of the rest of this looks a lot better. He has completed his antibiotics. His thigh is a lot better. Duplex ultrasound did not show a DVT in the thigh 11/08/16; he comes in today with more Denuded surface epithelium from the posterior aspect of his calf. There is no real evidence of cellulitis. The superior aspect of his wrap appears to have put quite an  indentation in his leg just below the knee and this may have contributed. He does not complain of pain or fever. We have been using silver alginate as the primary dressing. The area of cellulitis in the right thigh has totally resolved. He has been using his compression stockings once a week 11/15/16; the patient arrives today with more loss of epithelium from the posterior aspect of his left calf. He now has a fairly substantial wound in this area. The reason behind this deterioration isn't exactly clear although his edema is not well controlled. He states he feels he is generally more swollen systemically. He is not complaining of chest pain shortness of breath fever. T me he has an appointment with his primary physician in early February. He is on 80 mg of oral ells Lasix a day. He claims compliance with the external compression pumps. He is not having any pain in his legs similar to what he has with his recurrent cellulitis 11/22/16; the patient arrives a follow-up of his large area on his left lateral calf. This looks somewhat better today. He came in earlier in the week for a dressing change since I saw him a week ago. He is not complaining of any pain no shortness of breath no chest pain 11/28/16; the patient arrives for follow-up of his large area on the left lateral calf this does not look better. In fact it is larger weeping edema. The surface of the wound does not look too bad. We have been using silver alginate although I'm not certain that this is a dressing issue. 12/05/16; again the patient follows up for a large wound on the left lateral and left posterior calf this does not look better. There continues  to be weeping edema necrotic surface tissue. More worrisome than this once again there is erythema below the wound involving the distal Achilles and heel suggestive of cellulitis. He is on his feet working most of the day of this is not going well. We are changing his dressing twice a week to  facilitate the drainage. 12/12/16; not much change in the overall dimensions of the large area on the left posterior calf. This is very inflamed looking. I gave him an. Doxycycline last week does not really seem to have helped. He found the wrap very painful indeed it seems to of dog into his legs superiorly and perhaps around the heel. He came in early today because the drainage had soaked through his dressings. 12/19/16- patient arrives for follow-up evaluation of his left lower extremity ulcers. He states that he is using his lymphedema pumps once daily when there is "no drainage". He admits to not using his lipedema pumps while under current treatment. His blood sugars have been consistently between 150-200. 12/26/16; the patient is not using his compression pumps at home because of the wetness on his feet. I've advised him that I think it's important for him to use this daily. He finds his feet too wet, he can put a plastic bag over his legs while he is in the pumps. Otherwise I think will be in a vicious circle. We are using silver alginate to the major area on his left posterior calf 01/02/17; the patient's posterior left leg has further of all into 3 open wounds. All of them covered with a necrotic surface. He claims to be using his compression pumps once a day. His edema control is marginal. Continue with silver alginate 01/10/17; the patient's left posterior leg actually looks somewhat better. There is less edema, less erythema. Still has 3 open areas covered with a necrotic surface requiring debridement. He claims to be using his compression pumps once a day his edema control is better 01/17/17; the patient's left posterior calf look better last week when I saw him and his wrap was changed 2 days ago. He has noted increasing pain in the left heel and arrives today with much larger wounds extensive erythema extending down into the entire heel area especially tender medially. He is not  systemically unwell CBGs have been controlled no fever. Our intake nurse showed me limegreen drainage on his AVD pads. 01/24/17; his usual this patient responds nicely to antibiotics last week giving him Levaquin for presumed Pseudomonas. The whole entire posterior part of his leg is much better much less inflamed and in the case of his Achilles heel area much less tender. He has also had some epithelialization posteriorly there are still open areas here and still draining but overall considerably better 01/31/17- He has continue to tolerate the compression wraps. he states that he continues to use the lymphedema pumps daily, and can increase to twice daily on the weekends. He is voicing no complaints or concerns regarding his LLE ulcers 02/07/17-he is here for follow-up evaluation. He states that he noted some erythema to the left medial and anterior thigh, which he states is new as of yesterday. He is concerned about recurrent cellulitis. He states his blood sugars have been slightly elevated, this morning in the 180s 02/14/17; he is here for follow-up evaluation. When he was last here there was erythema superiorly from his posterior wound in his anterior thigh. He was prescribed Levaquin however a culture of the wound surface grew MRSA over the  phone I changed him to doxycycline on Monday and things seem to be a lot better. 02/24/17; patient missed his appointment on Friday therefore we changed his nurse visit into a physician visit today. Still using silver alginate on the large area of the posterior left thigh. He isn't new area on the dorsal left second toe 03/03/17; actually better today although he admits he has not used his external compression pumps in the last 2 days or so because of work responsibilities over the weekend. 03/10/17; continued improvement. External compression pumps once a day almost all of his wounds have closed on the posterior left calf. Better edema control 03/17/17; in general  improved. He still has 3 small open areas on the lateral aspect of his left leg however most of the area on the posterior part of his leg is epithelialized. He has better edema control. He has an ABD pad under his stocking on the right anterior lower leg although he did not let us look at that today. 03/24/17; patient arrives back in clinic today with no open areas however there are areas on the posterior left calf and anterior left calf that are less than 100% epithelialized. His edema is well controlled in the left lower leg. There is some pitting edema probably lymphedema in the left upper thigh. He uses compression pumps at home once per day. I tried to get him to do this twice a day although he is very reticent. 04/01/2017 -- for the last 2 days he's had significant redness, tenderness and weeping and came in for an urgent visit today. 04/07/17; patient still has 6 more days of doxycycline. He was seen by Dr. Meyer Russel last Wednesday for cellulitis involving the posterior aspect, lateral aspect of his Involving his heel. For the most part he is better there is less erythema and less weeping. He has been on his feet for 12 hours 2 over the weekend. Using his compression pumps once a day 04/14/17 arrives today with continued improvement. Only one area on the posterior left calf that is not fully epithelialized. He has intense bilateral venous inflammation associated with his chronic venous insufficiency disease and secondary lymphedema. We have been using silver alginate to the left posterior calf wound In passing he tells [place] today that the right leg but we have not seen in quite some time has an open area on it but he doesn't want [place] to look at this today states he will show this to [place] next week. 04/21/17; there is no open area on his left leg although he still reports some weeping edema. He showed [place] his right leg today which is the first time we've seen this leg in a long time. He has a large area of  open wound on the right leg anteriorly healthy granulation. Quite a bit of swelling in the right leg and some degree of venous inflammation. He told us about the right leg in passing last week but states that deterioration in the right leg really only happened over the weekend 04/28/17; there is no open area on the left leg although there is an irritated part on the posterior which is like a wrap injury. The wound on the right leg which was new from last week at least to [place] is a lot better. 05/05/17; still no open area on the left leg. Patient is using his new compression stocking which seems to be doing a good job of controlling the edema. He states he is using his compression  pumps once per day. The right leg still has an open wound although it is better in terms of surface area. Required debridement. A lot of pain in the posterior right Achilles marked tenderness. Usually this type of presentation this patient gives concern for an active cellulitis 05/12/17; patient arrives today with his major wound from last week on the right lateral leg somewhat better. Still requiring debridement. He was using his compression stocking on the left leg however that is reopened with superficial wounds anteriorly he did not have an open wound on this leg previously. He is still using his juxta light's once daily at night. He cannot find the time to do this in the morning as he has to be at work by 7 AM 05/19/17; right lateral leg wound looks improved. No debridement required. The concerning area is on the left posterior leg which appears to almost have a subcutaneous hemorrhagic component to it. We've been using silver alginate to all the wounds 05/26/17; the right lateral leg wound continues to look improved. However the area on the left posterior calf is a tightly adherent surface. [place] using silver alginate. Because of the weeping edema in his legs there is very little good alternatives. 06/02/17; the patient left  here last week looking quite good. Major wound on the left posterior calf and a small one on the right lateral calf. Both of these look satisfactory. He tells me that by Wednesday he had noted increased pain in the left leg and drainage. He called on Thursday and Friday to get an appointment here but we were blocked. He did not go to urgent care or his primary physician. He thinks he had a fever on Thursday but did not actually take his temperature. He has not been using his compression pumps on the left leg because of pain. I advised him to go to the emergency room today for IV antibiotics for stents of left leg cellulitis but he has refused I have asked him to take 2 days off work to keep his leg elevated and he has refused this as well. In view of this I'm going to call him and Augmentin and doxycycline. 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 However after a detailed discussion regarding the risk benefits and alternatives he refuses to get admitted to the hospital. With no other recourse I will continue him on oral antibiotics as before and hopefully he'll have his infectious disease consultation this week. 06/16/2017 -- the patient was seen today by the nurse practitioner at infectious disease Ms. Dixon. 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 He has also been advise Lotrimin cream locally. she also had wise IV antibiotics for his condition if it flares up 06/23/17; patient arrives today with drainage bilaterally although the remaining wound on the left posterior calf after  cleaning up today "highlighter yellow drainage" did not look too bad. Unfortunately he has had breakdown on the right anterior leg [previously this leg had not been open and he is using a black stocking] he went to see infectious disease  and is been put on clindamycin 150 mg daily, I did not verify the dose although I'm not familiar with using clindamycin in this dosing range, perhaps for prophylaxisoo 06/27/17; I brought this patient back today to follow-up on the wound deterioration on the right lower leg together with surrounding cellulitis. I started him on doxycycline 4 days ago. This area looks better however he comes in today with intense cellulitis on the medial part of his left thigh. This is not have a wound in this area. Extremely tender. We've been using silver alginate to the wounds on the right lower leg left lower leg with bilateral 4 layer compression he is using his external compression pumps once a day 07/04/17; patient's left medial thigh cellulitis looks better. He has not been using his compression pumps as his insert said it was contraindicated with cellulitis. His right leg continues to make improvements all the wounds are still open. We only have one remaining wound on the left posterior calf. Using silver alginate to all open areas. He is on doxycycline which I started a week ago and should be finishing I gave him Augmentin after Thursday's visit for the severe cellulitis on the left medial thigh which fortunately looks better 07/14/17; the patient's left medial thigh cellulitis has resolved. The cellulitis in his right lower calf on the right also looks better. All of his wounds are stable to improved we've been using silver alginate he has completed the antibiotics I have given him. He has clindamycin 150 mg once a day prescribed by infectious disease for prophylaxis, I've advised him to start this now. We have been using bilateral Unna boots over silver alginate to the wound  areas 07/21/17; the patient is been to see infectious disease who noted his recurrent problems with cellulitis. He was not able to tolerate prophylactic clindamycin therefore he is on amoxicillin 500 twice a day. He also had a second daily dose of Lasix added By Dr. Oneta Rack but he is not taking this. Nor is he being completely compliant with his compression pumps a especially not this week. He has 2 remaining wounds one on the right posterior lateral lower leg and one on the left posterior medial lower leg. 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 The patient has Unna boots bilaterally. 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 He has bilateral Unna boots for compression and silver alginate to his wounds. Arrives today with his legs looking as good as I have seen him in quite some time. Not surprisingly his wounds look better as well with improvement on the right lateral leg venous insufficiency wound and also the left medial leg. He is still using the compression pumps once a day 08/11/17; both legs appear to be doing better wounds on the right lateral and left medial legs look better. Skin on the right leg quite good. He is been using silver alginate as the primary dressing. I'm going to use Anasept gel calcium alginate and maintain all the secondary dressings 08/18/17; the patient continues to actually do quite well. The area on his right lateral leg is just about closed the left medial also looks better although it is still moist in this area. His edema is well controlled we have been using Anasept gel with calcium alginate and  the usual secondary dressings, 4 layer compression and once daily use of his compression pumps "always been able to manage 09/01/17; the patient  continues to do reasonably well in spite of his trip to [place]. The area on the right lateral leg is epithelialized. Left is much better but still open. He has more edema and more chronic erythema on the left leg [venous inflammation] 09/08/17; he arrives today with no open wound on the right lateral leg and decently controlled edema. Unfortunately his left leg is not nearly as in his good situation as last week.he apparently had increasing edema starting on Saturday. He edema soaked through into his foot so used a plastic bag to walk around his home. The area on the medial right leg which was his open area is about the same however he has lost surface epithelium on the left lateral which is new and he has significant pain in the Achilles area of the left foot. He is already on amoxicillin chronically for prophylaxis of cellulitis in the left leg 09/15/17; he is completed a week of doxycycline and the cellulitis in the left posterior leg and Achilles area is as usual improved. He still has a lot of edema and fluid soaking through his dressings. There is no open wound on the right leg. He saw infectious disease NP today 09/22/17;As usual 1 we transition him from our compression wraps to his stockings things did not go well. He has several small open areas on the right leg. He states this was caused by the compression wrap on his skin although he did not wear this with the stockings over them. He has several superficial areas on the left leg medially laterally posteriorly. He does not have any evidence of active cellulitis especially involving the left Achilles The patient is traveling from [facility] Saturday going to [facility]. He states he isn't attempting to get an appointment with a heel objects wound center there to change his dressings. I am not completely certain whether this will work 10/06/17; the patient came in on Friday for a nurse visit and the nurse reported that his legs actually look  quite good. He arrives in clinic today for his regular follow-up visit. He has a new wound on his left third toe over the PIP probably caused by friction with his footwear. He has small areas on the left leg and a very superficial but epithelialized area on the right anterior lateral lower leg. Other than that his legs look as good as I've seen him in quite some time. We have been using silver alginate Review of systems; no chest pain no shortness of breath other than this a 10 point review of systems negative 10/20/17; seen by Dr. Meyer Russel last week. He had taken some antibiotics [doxycycline] that he had left over. Dr. Meyer Russel thought he had candida infection and declined to give him further antibiotics. He has a small wound remaining on the right lateral leg several areas on the left leg including a larger area on the left posterior several left medial and anterior and a small wound on the left lateral. The area on the left dorsal third toe looks a lot better. ROS; Gen.; no fever, respiratory no cough no sputum Cardiac no chest pain other than this 10 point review of system is negative 10/30/17; patient arrives today having fallen in the bathtub 3 days ago. It took him a while to get up. He has pain and maceration in the wounds on his left  leg which have deteriorated. He has not been using his pumps he also has some maceration on the right lateral leg. 11/03/17; patient continues to have weeping edema especially in the left leg. This saturates his dressings which were just put on on 12/27. As usual the doxycycline seems to take care of the cellulitis on his lower leg. He is not complaining of fever, chills, or other systemic symptoms. He states his leg feels a lot better on the doxycycline I gave him empirically. He also apparently gets injections at his primary doctor's officeo Rocephin for cellulitis prophylaxis. I didn't ask him about his compression pump compliance today I think that's probably  marginal. Arrives in the clinic with all of his dressings primary and secondary macerated full of fluid and he has bilateral edema 11/10/17; the patient's right leg looks some better although there is still a cluster of wounds on the right lateral. The left leg is inflamed with almost circumferential skin loss medially to laterally although we are still maintaining anteriorly. He does not have overt cellulitis there is a lot of drainage. He is not using compression pumps. We have been using silver alginate to the wound areas, there are not a lot of options here 11/17/17; the patient's right leg continues to be stable although there is still open wounds, better than last week. The inflammation in the left leg is better. Still loss of surface layer epithelium especially posteriorly. There is no overt cellulitis in the amount of edema and his left leg is really quite good, tells me he is using his compression pumps once a day. 11/24/17; patient's right leg has a small superficial wound laterally this continues to improve. The inflammation in the left leg is still improving however we have continuous surface layer epithelial loss posteriorly. There is no overt cellulitis in the amount of edema in both legs is really quite good. He states he is using his compression pumps on the left leg once a day for 5 out of 7 days 12/01/17; very small superficial areas on the right lateral leg continue to improve. Edema control in both legs is better today. He has continued loss of surface epithelialization and left posterior calf although I think this is better. We have been using silver alginate with large number of absorptive secondary dressings 4 layer on the left Unna boot on the right at his request. He tells me he is using his compression pumps once a day 12/08/17; he has no open area on the right leg is edema control is good here. On the left leg however he has marked erythema and tenderness breakdown of skin. He has  what appears to be a wrap injury just distal to the popliteal fossa. 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 There was swelling erythema and breakdown in the right leg. He has much worse skin breakdown in the left leg as well multiple open areas medially and posteriorly as well as laterally. He tells me he has been using his compression pumps but tells me he feels that the drainage out of his leg is worse when he uses a compression pumps. T be fair to him he is been saying this o for a while however I don't  know that I have really been listening to this. I wonder if the compression pumps are working properly 12/22/17;. Once again he arrives with severe erythema, weeping edema from the left greater than right leg. Noncompliance with compression pumps. New this visit he is complaining of pain on the lateral aspect of the right leg and the medial aspect of his right thigh. He apparently saw his cardiologist Dr. [name] who was ordered an echocardiogram area and I think this is a step in the right direction 12/25/17; started his doxycycline Monday night. There is still intense erythema of the right leg especially in the anterior thigh although there is less tenderness. The erythema around the wound on the right lateral calf also is less tender. He still complaining of pain in the left heel. His wounds are about the same right lateral left medial left lateral. Superficial but certainly not close to closure. He denies being systemically unwell no fever chills no abdominal pain no diarrhea 12/29/17; back in follow-up of his extensive right calf and right thigh cellulitis. I added amoxicillin to cover possible doxycycline resistant  strep. This seems to of done the trick he is in much less pain there is much less erythema and swelling. He has his echocardiogram at 11:00 this morning. X-ray of the left heel was also negative. 01/05/18; the patient arrived with his edema under much better control. Now that he is retired he is able to use his compression pumps daily and sometimes twice a day per the patient. He has a wound on the right leg the lateral wound looks better. Area on the left leg also looks a lot better. He has no evidence of cellulitis in his bilateral thighs I had a quick peak at his echocardiogram. He is in normal ejection fraction and normal left ventricular function. He has moderate pulmonary hypertension moderately reduced right ventricular function. One would have to wonder about chronic sleep apnea although he says he doesn't snore. He'll review the echocardiogram with his cardiologist. 01/12/18; the patient arrives with the edema in both legs under exemplary control. He is using his compression pumps daily and sometimes twice daily. His wound on the right lateral leg is just about closed. He still has some weeping areas on the posterior left calf and lateral left calf although everything is just about closed here as well. I have spoken with Aldean Baker who is the patient's nurse practitioner and infectious disease. She was concerned that the patient had not understood that the parenteral penicillin injections he was receiving for cellulitis prophylaxis was actually benefiting him. I don't think the patient actually saw that I would tend to agree we were certainly dealing with less infections although he had a serious one last month. 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 He is healed. He'll be placed in juxtalite compression wraps and increase his lymphedema pumps to twice daily. We will follow up again next week to ensure there are no issues with the new  regiment. 01/20/18-he is here for evaluation of bilateral lower extremity weeping edema. Yesterday he was placed in compression wrap to the right lower extremity and compression stocking to left lower shrubbery. He states he uses lymphedema pumps last night and again this morning and noted a blister to the left lower extremity. On exam he was noted to have drainage to the right lower extremity. He will be placed in Unna boots bilaterally and follow-up next week 01/26/18; patient was actually discharged a week  ago to his own juxta light stockings only to return the next day with bilateral lower extremity weeping edema.he was placed in bilateral Unna boots. He arrives today with pain in the back of his left leg. There is no open area on the right leg however there is a linear/wrap injury on the left leg and weeping edema on the left leg posteriorly. I spoke with infectious disease about 10 days ago. They were disappointed that the patient elected to discontinue prophylactic intramuscular penicillin shots as they felt it was particularly beneficial in reducing the frequency of his cellulitis. I discussed this with the patient today. He does not share this view. He'll definitely need antibiotics today. Finally [place] and returning a week later and he does not travel with his pumps. He is going by car 01/30/18; patient was seen 4 days ago and brought back in today for review of cellulitis in the left leg posteriorly. I put him on amoxicillin this really hasn't helped as much as I might like. He is also worried because [place]. Finally we will be rewrapping him. There is no open area on the right leg over his left leg has multiple weeping areas as usual 02/09/18; The same wrap on for 10 days. He did not pick up the last doxycycline I prescribed for him. He apparently took 4 days worth he already had. There is nothing open on  his right leg and the edema control is really quite good. He's had damage in the left leg medially and laterally especially probably related to the prolonged use of Unna boots 02/12/18; the patient arrived in clinic today for a nurse visit/wrap change. He complained of a lot of pain in the left posterior calf. He is taking doxycycline that I previously prescribed for him. Unfortunately even though he used his stockings and apparently used to compression pumps twice a day he has weeping edema coming out of the lateral part of his right leg. This is coming from the lower anterior lateral skin area. 02/16/18; the patient has finished his doxycycline and will finish the amoxicillin 2 days. The area of cellulitis in the left calf posteriorly has resolved. He is no longer having any pain. He tells me he is using his compression pumps at least once a day sometimes twice. 02/23/18; the patient finished his doxycycline and Amoxil last week. On Friday he noticed a small erythematous circle about the size of a quarter on the left lower leg just above his ankle. This rapidly expanded and he now has erythema on the lateral and posterior part of the thigh. This is bright red. Also has an area on the dorsal foot just above his toes and a tender area just below the left popliteal fossa. He came off his prophylactic penicillin injections at his own insistence one or 2 months ago. This is obviously deteriorated since then 03/02/18; patient is on doxycycline and Amoxil. Culture I did last week of the weeping area on the back of his left calf grew group B strep. I have therefore renewed the amoxicillin 500 3 times a day for a further week. He has not been systemically unwell. Still complaining of an area of discomfort right under his left popliteal fossa. There is no open wound on the right leg. He tells me that he is using his pumps twice a day on most days 03/09/18; patient arrives in clinic today completing his  amoxicillin  today. The cellulitis on his left leg is better. Furthermore he tells me that he had intramuscular penicillin shots that his primary care office today. However he also states that the wrap on his right leg fell down shortly after leaving clinic last week. He developed a large blister that was present when he came in for a nurse visit later in the week and then he developed intense discomfort around this area.He tells me he is using his compression pumps 03/16/18; the patient has completed his doxycycline. The infectious part of this/cellulitis in the left heel area left popliteal area is a lot better. He has 2 open areas on the right calf. Still areas on the left calf but this is a lot better as well. 03/24/18; the patient arrives complaining of pain in the left popliteal area again. He thinks some of this is wrap injury. He has no open area on the right leg and really no open area on the left calf either except for the popliteal area. He claims to be compliant with the compression pumps 03/31/18; I gave him doxycycline last week because of cellulitis in the left popliteal area. This is a lot better although the surface epithelium is denuded off and response to this. He arrives today with uncontrolled edema in the right calf area as well as a fingernail injury in the right lateral calf. There is only a few open areas on the left 04/06/18; I gave him amoxicillin doxycycline over the last 2 weeks that the amoxicillin should be completing currently. He is not complaining of any pain or systemic symptoms. The only open areas see has is on the right lateral lower leg paradoxically I cannot see anything on the left lower leg. He tells me he is using his compression pumps twice a day on most days. Silver alginate to the wounds that are open under 4 layer compression 04/13/18; he completed antibiotics and has no new complaints. Using his compression pumps. Silver alginate that anything that's opened 04/20/18; he is  using his compression pumps religiously. Silver alginate 4 layer compression anything that's opened. He comes in today with no open wounds on the left leg but 3 on the right including a new one posteriorly. He has 2 on the right lateral and one on the right posterior. He likes Unna boots on the right leg for reasons that aren't really clear we had the usual 4 layer compression on the left. It may be necessary to move to the 4 layer compression on the right however for now I left them in the Unna boots 04/27/18; he is using his compression pumps at least once a day. He has still the wounds on the right lateral calf. The area right posteriorly has closed. He does not have an open wound on the left under 4 layer compression however on the dorsal left foot just proximal to the toes and the left third toe 2 small open areas were identified 05/11/18; he has not uses compression pumps. The areas on the right lateral calf have coalesced into one large wound necrotic surface. On the left side he has one small wound anteriorly however the edema is now weeping out of a large part of his left leg. He says he wasn't using his pumps because of the weeping fluid. I explained to him that this is the time he needs to pump more 05/18/18; patient states he is using his compression pumps twice a day. The area on the right lateral large  wound albeit superficial. On the left side he has innumerable number of small new wounds on the left calf particularly laterally but several anteriorly and medially. All these appear to have healthy granulated base these look like the remnants of blisters however they occurred under compression. The patient arrives in clinic today with his legs somewhat better. There is certainly less edema, less multiple open areas on the left calf and the right anterior leg looks somewhat better as well superficial and a little smaller. However he relates pain and erythema over the last 3-4 days in the thigh  and I looked at this today. He has not been systemically unwell no fever no chills no change in blood sugar values 05/25/18; comes in today in a better state. The severe cellulitis on his left leg seems better with the Keflex. Not as tender. He has not been systemically unwell Hard to find an open wound on the left lower leg using his compression pumps twice a day The confluent wounds on his right lateral calf somewhat better looking. These will ultimately need debridement I didn't do this today. 06/01/18; the severe cellulitis on the left anterior thigh has resolved and he is completed his Keflex. There is no open wound on the left leg however there is a superficial excoriation at the base of the third toe dorsally. Skin on the bottom of his left foot is macerated looking. The left the wounds on the lateral right leg actually looks some better although he did require debridement of the top half of this wound area with an open curet 06/09/18 on evaluation today patient appears to be doing poorly in regard to his right lower extremity in particular this appears to likely be infected he has very thick purulent discharge along with a bright green tent to the discharge. This makes me concerned about the possibility of pseudomonas. He's also having increased discomfort at this point on evaluation. Fortunately there does not appear to be any evidence of infection spreading to the other location at this time. 06/16/18 on evaluation today patient appears to actually be doing fairly well. His ulcer has actually diminished in size quite significantly at this point which is good news. Nonetheless he still does have some evidence of infection he did see infectious disease this morning before coming here for his appointment. I did review the results of their evaluation and their note today. They did actually have him discontinue the Cipro and initiate treatment with linezolid at this time. He is doing this for the  next seven days and they recommended a follow-up in four months with them. He is the keep a log of the need for intermittent antibiotic therapy between now and when he falls back up with infectious disease. This will help them gaze what exactly they need to do to try and help them out. 06/23/18; the patient arrives today with no open wounds on the left leg and left third toe healed. He is been using his compression pumps twice a day. On the right lateral leg he still has a sizable wound but this is a lot better than last time I saw this. 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 Has been using silver alginate under 4 layer compression 06/30/18; the only open wound he has is on the right lateral leg and this looks healthy. No debridement is required. We have been using silver alginate.  He does not have an open wound on the left leg. There is apparently some drainage from the dorsal proximal third toe on the left although I see no open wound here. 07/03/18 on evaluation today patient was actually here just for a nurse visit rapid change. However when he was here on Wednesday for his rat change due to having been healed on the left and then developing blisters we initiated the wrap again knowing that he would be back today for [place] to reevaluate and see were at. Unfortunately he has developed some cellulitis into the proximal portion of his right lower extremity even into the region of his thigh. 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 He was placed on one as what at that point. Nonetheless he is done with that and has been tolerating it well otherwise. Doxycycline which in the past really did not seem to be effective for him. Nonetheless I think the best option may be for [place] to definitely reinitiate the antibiotics for a longer period of time. 07/07/18; since I  last saw this patient a week ago he has had a difficult time. At that point he did not have an open wound on his left leg. We transitioned him into juxta light stockings. He was apparently in the clinic the next day with blisters on the left lateral and left medial lower calf. He also had weeping edema fluid. He was put back into a compression wrap. He was also in the clinic on Friday with intense erythema in his right thigh. Per the patient he was started on Bactrim however that didn't work at all in terms of relieving his pain and swelling. He has taken 3 doxycycline that he had left over from last time and that seems to of helped. He has blistering on the right thigh as well. 07/14/18; the erythema on his right thigh has gotten better with doxycycline that he is finishing. 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 Presumably this cellulitis in the thigh was not related to that although I think this is a bit concerning going forward. He still has an area on the right lateral calf the blister on the right medial calf just below the knee that was discussed above. On the left 2 small open areas left medial and left lateral. Edema control is adequate. He is using his compression pumps twice a day 07/20/18; continued improvement in the condition of both legs especially the edema in his bilateral thighs. He tells me he is been losing weight through a combination of diet and exercise. He is using his compression pumps twice a day. So overall she made to the remaining wounds 07/27/2018; continued improvement in condition of both legs. His edema is well controlled. The area on the right lateral leg is just about closed he had one blisters show up on the medial left upper calf. We have him in 4 layer compression. He is going on a 10-day trip to [place]. He will be driving. He wants to wear Unna boots because of  the lessening amount of constriction. He will not use compression pumps while he is away 08/05/18 on evaluation today patient actually appears to be doing decently well all things considered in regard to his bilateral lower extremities. The worst ulcer is actually only posterior aspect of his left lower extremity  with a four layer compression wrap cut into his leg a couple weeks back. He did have a trip and actually had [employer] for the trip that he is worn since he was last here. Nonetheless he feels like the [employer] actually do better for him his swelling is up a little bit but he also with his trip was not taking his Lasix on a regular set schedule like he was supposed to be. He states that obviously the reason being that he cannot drive and keep going without having to urinate too frequently which makes it difficult. He did not have his pumps with him while he was away either which I think also maybe playing a role here too. 08/13/2018; the patient only has a small open wound on the right lateral calf which is a big improvement in the last month or 2. He also has the area posteriorly just below the posterior fossa on the left which I think was a wrap injury from several weeks ago. He has no current evidence of cellulitis. He tells me he is back into his compression pumps twice a day. He also tells me that while he was at the laundromat somebody stole a section of his extremitease stockings 08/20/2018; back in the clinic with a much improved state. He only has small areas on the right lateral mid calf which is just about healed. This was is more substantial area for quite a prolonged period of time. He has a small open area on the left anterior tibia. The area on the posterior calf just below the popliteal fossa is closed today. He is using his compression pumps twice a day 08/28/2018; patient has no open wound on the right leg. He has a smattering of open areas on the calf with some  weeping lymphedema. More problematically than that it looks as though his wraps of slipped down in his usual he has very angry upper area of edema just below the right medial knee and on the right lateral calf. He has no open area on his feet. The patient is traveling to [facility] next week. I will send him in an antibiotic. We will continue to wrap the right leg. We ordered extremitease stockings for him last week and I plan to transition the right leg to a stocking when he gets home which will be in 10 days time. As usual he is very reluctant to take his pumps with him when he travels 09/07/2018; patient returns from [facility]. He shows me a picture of his left leg in the mid part of his trip last week with intense fire engine erythema. The picture look bad enough I would have considered sending him to the hospital. Instead he went to the wound care center in [facility]. They did not prescribe him antibiotics but he did take some doxycycline he had leftover from a previous visit. I had given him trimethoprim sulfamethoxazole before he left this did not work according to the patient. This is resulted in some improvement fortunately. He comes back with a large wound on the left posterior calf. Smaller area on the left anterior tibia. Denuded blisters on the dorsal left foot over his toes. Does not have much in the way of wounds on the right leg although he does have a very tender area on the right posterior area just below the popliteal fossa also suggestive of infection. He promises me he is back on his pumps twice a day  09/15/2018; the intense cellulitis in his left lower calf is a lot better. The wound area on the posterior left calf is also so better. However he has reasonably extensive wounds on the dorsal aspect of his second and third toes and the proximal foot just at the base of the toes. There is nothing open on the right leg 09/22/2018; the patient has excellent edema  control in his legs bilaterally. He is using his external compression pumps twice a day. He has no open area on the right leg and only the areas in the left foot dorsally second and third toe area on the left side. He does not have any signs of active cellulitis. 10/06/2018; the patient has good edema control bilaterally. He has no open wound on the right leg. There is a blister in the posterior aspect of his left calf that we had to deal with today. He is using his compression pumps twice a day. There is no signs of active cellulitis. We have been using silver alginate to the wound areas. He still has vulnerable areas on the base of his left first second toes dorsally He has a his extremities stockings and we are going to transition him today into the stocking on the right leg. He is cautioned that he will need to continue to use the compression pumps twice a day. If he notices uncontrolled edema in the right leg he may need to go to 3 times a day. 10/13/2018; the patient came in for a nurse check on Friday he has a large flaccid blister on the right medial calf just below the knee. We unroofed this. He has this and a new area underneath the posterior mid calf which was undoubtedly a blister as well. He also has several small areas on the right which is the area we put his extremities stocking on. 10/19/2018; the patient went to see infectious disease this morning I am not sure if that was a routine follow-up in any case the doxycycline I had given him was discontinued and started on linezolid. He has not started this. It is easy to look at his left calf and the inflammation and think this is cellulitis however he is very tender in the tissue just below the popliteal fossa and I have no doubt that there is infection going on here. He states the problem he is having is that with the compression pumps the edema goes down and then starts walking the wrap falls down. We will see if we can adhere this. He  has 1 or 2 minuscule open areas on the right still areas that are weeping on the posterior left calf, the base of his left second and third toes 10/26/18; back today in clinic with quite of skin breakdown in his left anterior leg. This may have been infection the area below the popliteal fossa seems a lot better however tremendous epithelial loss on the left anterior mid tibia area over quite inexpensive tissue. He has 2 blisters on the right side but no other open wound here. 10/29/2018; came in urgently to see [place] today and we worked him in for review. He states that the 4 layer compression on the right leg caused pain he had to cut it down to roughly his mid calf this caused swelling above the wrap and he has blisters and skin breakdown today. As a result of the pain he has not been using his pumps. Both legs are a lot more edematous and there is  a lot of weeping fluid. 11/02/18; arrives in clinic with continued difficulties in the right leg> left. Leg is swollen and painful. multiple skin blisters and new open areas especially laterally. He has not been using his pumps on the right leg. 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 He is not systemically unwell. 11/09/2018; the patient claims he is being compliant with his pumps. He is finished the doxycycline I gave him last week. Culture I did of the wound on the right lateral leg showed a few very resistant methicillin staph aureus. This was resistant to doxycycline. Nevertheless he states the pain in the leg is a lot better which makes me wonder if the cultured organism was not really what was causing the problem nevertheless this is a very dangerous organism to be culturing out of any wound. His right leg is still a lot larger than the left. He is using an Radio broadcast assistant on this area, he blames a 4-layer compression for causing the original skin breakdown which I doubt is true however I cannot talk him out  of it. We have been using silver alginate to all of these areas which were initially blisters 11/16/2018; patient is being compliant with his external compression pumps at twice a day. Miraculously he arrives in clinic today with absolutely no open wounds. He has better edema control on the left where he has been using 4 layer compression versus wound of wounds on the right and I pointed this out to him. There is no inflammation in the skin in his lower legs which is also somewhat unusual for him. There is no open wounds on the dorsal left foot. He has extremitease stockings at home and I have asked him to bring these in next week. 11/25/18 patient's lower extremity on examination today on the left appears for the most part to be wound free. He does have an open wound on the lateral aspect of the right lower extremity but this is minimal compared to what I've seen in past. He does request that we go ahead and wrap the left leg as well even though there's nothing open just so hopefully it will not reopen in short order. 1/28; patient has superficial open wounds on the right lateral calf left anterior calf and left posterior calf. His edema control is adequate. 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 We have been using silver alginate as the primary dressing. He claims compliance with his compression pumps 2/4; patient has superficial open wounds on numerous areas of his left calf and again one on the left dorsal foot. The areas on the right lateral calf have healed. The cellulitis that I gave him doxycycline for last week is also resolved this was mostly on the left anterior calf just below the tibial tuberosity. His edema looks fairly well-controlled. He tells me he went to see his primary doctor today and had blood work ordered 2/11; once again he has several open areas on the left calf left tibial area. Most of  these are small and appear to have healthy granulation. He does not have anything open on the right. The edema and control in his thighs is pretty good which is usually a good indication he has been using his pumps as requested. 2/18; he continues to have several small areas on the left calf and left tibial area. Most of these  are small healthy granulation. We put him in his stocking on the right leg last week and he arrives with a superficial open area over the right upper tibia and a fairly large area on the right lateral tibia in similar condition. His edema control actually does not look too bad, he claims to be using his compression pumps twice a day 2/25. Continued small areas on the left calf and left tibial area. New areas especially on the right are identified just below the tibial tuberosity and on the right upper tibia itself. There are also areas of weeping edema fluid even without an obvious wound. He does not have a considerable degree of lymphedema but clearly there is more edema here than his skin can handle. He states he is using the pumps twice a day. We have an Unna boot on the right and 4 layer compression on the left. 3/3; he continues to have an area on the right lateral calf and right posterior calf just below the popliteal fossa. There is a fair amount of tenderness around the wound on the popliteal fossa but I did not see any evidence of cellulitis, could just be that the wrap came down and rubbed in this area. He does not have an open area on the left leg however there is an area on the left dorsal foot at the base of the third toe We have been using silver alginate to all wound areas 3/10; he did not have an open area on his left leg last time he was here a week ago. T oday he arrives with a horizontal wound just below the tibial tuberosity and an area on the left lateral calf. He has intense erythema and tenderness in this area. The area is on the right lateral calf and  right posterior calf better than last week. We have been using silver alginate as usual 3/18 - Patient returns with 3 small open areas on left calf, and 1 small open area on right calf, the skin looks ok with no significant erythema, he continues the UNA boot on right and 4 layer compression on left. The right lateral calf wound is closed , the right posterior is small area. we will continue silver alginate to the areas. 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 He is been tolerating the dressing changes without complication. Fortunately she has made excellent improvement in regard to the overall status of his wounds. Unfortunately every time we cease wrapping him he ends up reopening in causing more significant issues at that point. Again I'm unsure of the best direction to take although I think the lymphedema clinic may be appropriate for him. 02/03/19 on evaluation today patient appears to be doing well in regard to the wounds that we saw him for last week unfortunately he has a new area on the proximal portion of his right medial/posterior lower extremity where the wrap somewhat slowed down and caused swelling and a blister to rub and open. Unfortunately this is the only opening that he has on either leg at this point. 02/17/19 on evaluation today patient's bilateral lower extremities appear to be doing well. He still completely healed in regard to the left lower extremity. In regard to the right lower extremity the area where the wrap and slid down and caused the blister  still seems to be slightly open although this is dramatically better than during the last evaluation two weeks ago. I'm very pleased with the way this stands overall. 03/03/19 on evaluation today patient appears to be doing well in regard  to his right lower extremity in general although he did have a new blister open this does not appear to be showing any evidence of active infection at this time. Fortunately there's No fevers, chills, nausea, or vomiting noted at this time. Overall I feel like he is making good progress it does feel like that the right leg will we perform the [employer] seems to do with a bit better than three layer wrap on the left which slid down on him. We may switch to doing bilateral in the book wraps. 5/4; I have not seen Mr. Pareja in quite some time. According to our case manager he did not have an open wound on his left leg last week. He had 1 remaining wound on the right posterior medial calf. He arrives today with multiple openings on the left leg probably were blisters and/or wrap injuries from Unna boots. I do not think the Unna boot's will provide adequate compression on the left. I am also not clear about the frequency he is using the compression pumps. 03/17/19 on evaluation today patient appears to be doing excellent in regard to his lower extremities compared to last week's evaluation apparently. He had gotten significantly worse last week which is unfortunate. The [employer] wrap on the left did not seem to do very well for him at all and in fact it didn't control his swelling significantly enough he had an additional outbreak. Subsequently we go back to the four layer compression wrap on the left. This is good news. At least in that he is doing better and the wound seem to be killing him. He still has not heard anything from the lymphedema clinic. 03/24/19 on evaluation today patient actually appears to be doing much better in regard to his bilateral lower Trinity as compared to last week when I saw him. Fortunately there's no signs of active infection at this time. He has been tolerating the dressing changes without complication. Overall I'm extremely pleased with the progress and appearance in  general. 04/07/19 on evaluation today patient appears to be doing well in regard to his bilateral lower extremities. His swelling is significantly down from where it was previous. With that being said he does have a couple blisters still open at this point but fortunately nothing that seems to be too severe and again the majority of the larger openings has healed at this time. 04/14/19 on evaluation today patient actually appears to be doing quite well in regard to his bilateral lower extremities in fact I'm not even sure there's anything significantly open at this time at any site. Nonetheless he did have some trouble with these wraps where they are somewhat irritating him secondary to the fact that he has noted that the graph wasn't too close down to the end of this foot in a little bit short as well up to his knee. Otherwise things seem to be doing quite well. 04/21/19 upon evaluation today patient's wound bed actually showed evidence of being completely healed in regard to both lower extremities which is excellent news. There does not appear to be any signs of active infection which is also good news. I'm very pleased in this regard. No fevers, chills, nausea, or vomiting noted at this time. 04/28/19  on evaluation today patient appears to be doing a little bit worse in regard to both lower extremities on the left mainly due to the fact that when he went infection disease the wrap was not wrapped quite high enough he developed a blister above this. On the right he is a small open area of nothing too significant but again this is continuing to give him some trouble he has been were in the Velcro compression that he has at home. 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 He's been tolerating the dressing changes without complication. Fortunately there's no signs of active infection at this time. No fevers, chills, nausea, or vomiting noted at  this time. We have been trying to get an appointment with her lymphedema clinic in [facility] but unfortunately nobody can get them on phone with not been able to even fax information over the patient likewise is not been able to get in touch with them. Overall I'm not sure exactly what's going on here with to reach out again today. 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 Still having a lot of drainage unfortunately. He tells me especially in the left but even on the right. There's no signs of active infection which is good news we've been using so ratcheted up to this point. 05/19/19 on evaluation today patient actually appears to be doing quite well with regard to his left lower extremity which is great news. Fortunately in regard to the right lower extremity has an issues with his wrap and he subsequently did remove this from what I'm understanding. Nonetheless long story short is what he had rewrapped once he removed it subsequently had maggots underneath this wrap whenever he came in for evaluation today. With that being said they were obviously completely cleaned away by the nursing staff. The visit today which is excellent news. However he does appear to potentially have some infection around the right ankle region where the maggots were located as well. He will likely require anabiotic therapy today. 05/26/19 on evaluation today patient actually appears to be doing much better in regard to his bilateral lower extremities. I feel like the infection is under much better control. With that being said there were maggots noted when the wrap was removed yet again today. Again this could have potentially been left over from previous although at this time there does not appear to be any signs of significant drainage there was obviously on the wrap some drainage as well this contracted gnats or  otherwise. Either way I do not see anything that appears to be doing worse in my pinion and in fact I think his drainage has slowed down quite significantly likely mainly due to the fact to his infection being under better control. 06/02/2019 on evaluation today patient actually appears to be doing well with regard to his bilateral lower extremities there is no signs of active infection at this time which is great news. With that being said he does have several open areas more so on the right than the left but nonetheless these are all significantly better than previously noted. 06/09/2019 on evaluation today patient actually appears to be doing well. His wrap stayed up and he did not cause any problems he had more drainage on the right compared to the left but overall I do not see any major issues at  this time which is great news. 06/16/2019 on evaluation today patient appears to be doing excellent with regard to his lower extremities the only area that is open is a new blister that can have opened as of today on the medial ankle on the left. Other than this he really seems to be doing great I see no major issues at this point. 06/23/2019 on evaluation today patient appears to be doing quite well with regard to his bilateral lower extremities. In fact he actually appears to be almost completely healed there is a small area of weeping noted of the right lower extremity just above the ankle. Nonetheless fortunately there is no signs of active infection at this time which is good news. No fevers, chills, nausea, vomiting, or diarrhea. 8/24; the patient arrived for a nurse visit today but complained of very significant pain in the left leg and therefore I was asked to look at this. Noted that he did not have an open area on the left leg last week nevertheless this was wrapped. The patient states that he is not been able to put his compression pumps on the left leg because of the discomfort. He has not been  systemically unwell 06/30/2019 on evaluation today patient unfortunately despite being excellent last week is doing much worse with regard to his left lower extremity today. In fact he had to come in for a nurse on Monday where his left leg had to be rewrapped due to excessive weeping Dr. Leanord Hawking placed him on doxycycline at that point. Fortunately there is no signs of active infection Systemically at this time which is good news. 07/07/2019 in regard to the patient's wounds today he actually seems to be doing well with his right lower extremity there really is nothing open or draining at this point this is great news. Unfortunately the left lower extremity is given him additional trouble at this time. There does not appear to be any signs of active infection nonetheless he does have a lot of edema and swelling noted at this point as well as blistering all of which has led to a much more poor appearing leg at this time compared to where it was 2 weeks ago when it was almost completely healed. Obviously this is a little discouraging for the patient. He is try to contact the lymphedema clinic in [place] he has not been able to get through to them. 07/14/2019 on evaluation today patient actually appears to be doing slightly better with regard to his left lower extremity ulcers. Overall I do feel like at least at the top of the wrap that we have been placing this area has healed quite nicely and looks much better. The remainder of the leg is showing signs of improvement. Unfortunately in the thigh area he still has an open region on the left and again on the right he has been utilizing just a Band-Aid on an area that also opened on the thigh. Again this is an area that were not able to wrap although we did do an Ace wrap to provide some compression that something that obviously is a little less effective than the compression wraps we have been using on the lower portion of the leg. He does have an  appointment with the lymphedema clinic in [facility] on Friday. 07/21/2019 on evaluation today patient appears to be doing better with regard to his lower extremity ulcers. He has been tolerating the dressing changes without complication. Fortunately there is no signs of active  infection at this time. No fevers, chills, nausea, vomiting, or diarrhea. I did receive the paperwork from the physical therapist at the lymphedema clinic in [place]. Subsequently I signed off on that this morning and sent that back to him for further progression with the treatment plan. 07/28/2019 on evaluation today patient appears to be doing very well with regard to his right lower extremity where I do not see any open wounds at this point. Fortunately he is feeling great as far as that is concerned as well. In regard to the left lower extremity he has been having issues with still several areas of weeping and edema although the upper leg is doing better his lower leg still I think is going require the compression wrap at this time. No fevers, chills, nausea, vomiting, or diarrhea. 08/04/2019 on evaluation today patient unfortunately is having new wounds on the right lower extremity. Again we have been using Unna boot wrap on that side. We switched him to using his juxta lite wrap at home. With that being said he tells me he has been using it although his legs extremely swollen and to be honest really does not appear that he has been. I cannot know that for sure however. Nonetheless he has multiple new wounds on the right lower extremity at this time. Obviously we will have to see about getting this rewrapped for him today. 08/11/2019 on evaluation today patient appears to be doing fairly well with regard to his wounds. He has been tolerating the dressing changes including the compression wraps without complication. He still has a lot of edema in his upper thigh regions bilaterally he is supposed to be seeing the  lymphedema clinic on the 15th of this month once his wraps arrive for the upper part of his legs. 08/18/2019 on evaluation today patient appears to be doing well with regard to his bilateral lower extremities at this point. He has been tolerating the dressing changes without complication. Fortunately there is no signs of active infection which is also good news. He does have a couple weeping areas on the first and second toe of the right foot he also has just a small area on the left foot upper leg and a small area on the left lower leg but overall he is doing quite well in my opinion. He is supposed to be getting his wraps shortly in fact tomorrow and then subsequently is seeing the lymphedema clinic next Wednesday on the 21st. Of note he is also leaving on the 25th to go on vacation for a week to the beach. For that reason and since there is some uncertainty about what there can be doing at lymphedema clinic next Wednesday I am get a make an appointment for next Friday here for [place] to see what we need to do for him prior to him leaving for vacation. 10/23; patient arrives in considerable pain predominantly in the upper posterior calf just distal to the popliteal fossa also in the wound anteriorly above the major wound. This is probably cellulitis and he has had this recurrently in the past. He has no open wound on the right side and he has had an Radio broadcast assistant in that area. Finally I note that he has an area on the left posterior calf which by enlarge is mostly epithelialized. This protrudes beyond the borders of the surrounding skin in the setting of dry scaly skin and lymphedema. The patient is leaving for [facility] on Sunday. Per his longstanding pattern, he  will not take his compression pumps with him predominantly out of fear that they will be stolen. He therefore asked that we put a Unna boot back on the right leg. He [place] also [place] if [place] can  change his dressing in the mid week. 11/3; [place] He was seen on 1 occasion at their wound care center. They did a 2 layer compression system as they did not have our 4-layer wrap. I am not completely certain what they put on the wounds. They did not change the Unna boot on the right. The patient is also seeing a [place]ymphedema specialist [place]. It appears that he has some compression sleeve for his thighs which indeed look quite a bit better than I am used to seeing. He pumps over these with his external compression pumps. 11/10; the patient has a new wound on the right medial thigh otherwise there is no open areas on the right. He has an area on the left leg posteriorly anteriorly and medially and an area over the left second toe. We have been using silver alginate. He thinks the injury on his thigh is secondary to friction from the compression sleeve he has. 11/17; the patient has a new wound on the right medial thigh last week. He thinks this is because he did not have a underlying stocking for his thigh juxta lite apparatus. He now has this. The area is fairly large and somewhat angry but I do not think he has underlying cellulitis. He has a intact blister on the right anterior tibial area. Small wound on the right great toe dorsally Small area on the medial left calf. 11/30; the patient does not have any open areas on his right leg and we did not take his juxta lite stocking off. However he states that on Friday his compression wrap fell down lodging around his upper mid calf area. As usual this creates a lot of problems for him. He called urgently today to be seen for a nurse visit however the nurse visit turned into a provider visit because of extreme erythema and pain in the left anterior tibia extending laterally and posteriorly. The area that is problematic is extensive 10/06/2019 upon evaluation today patient actually  appears to be doing poorly in regard to his left lower extremity. He Dr. Leanord Hawkingobson did place him on doxycycline this past Monday apparently due to the fact that he was doing much worse in regard to this left leg. Fortunately the doxycycline does seem to be helping. Unfortunately we are still having a very difficult time getting his edema under any type of control in order to anticipate discharge at some point. The only way were really able to control his lymphedema really is with compression wraps and that has only even seemingly temporary. He has been seeing a lymphedema clinic they are trying to help in this regard but still this has been somewhat frustrating in general for the patient. 10/13/19 on evaluation today patient appears to be doing excellent with regard to his right lower extremity as far as the wounds are concerned. His swelling is still quite extensive unfortunately. He is still having a lot of drainage from the thigh areas bilaterally which is unfortunate. He's been going to lymphedema clinic but again he still really does not have this edema under control as far as his lower extremities are concern. With regard to his left lower  extremity this seems to be improving and I do believe the doxycycline has been of benefit for him. He is about to complete the doxycycline. 10/20/2019 on evaluation today patient appears to be doing poorly in regard to his bilateral lower extremities. More in the right thigh he has a lot of irritation at this site unfortunately. In regard to the left lower extremity the wrap was not quite as high it appears and does seem to have caused him some trouble as well. Fortunately there is no evidence of systemic infection though he does have some blue-green drainage which has me concerned for the possibility of Pseudomonas. He tells me he is previously taking Cipro without complications and he really does not care for Levaquin however due to some of the side effects he  has. He is not allergic to any medications specifically antibiotics that were aware of. 10/27/2019 on evaluation today patient actually does appear to be for the most part doing better when compared to last week's evaluation. With that being said he still has multiple open wounds over the bilateral lower extremities. He actually forgot to start taking the Cipro and states that he still has the whole bottle. He does have several new blisters on left lower extremity today I think I would recommend he go ahead and take the Cipro based on what I am seeing at this point. 12/30-Patient comes at 1 week visit, 4 layer compression wraps on the left and Unna boot on the right, primary dressing Xtrasorb and silver alginate. Patient is taking his Cipro and has a few more days left probably 5-6, and the legs are doing better. He states he is using his compressions devices which I believe he has 11/10/2019 on evaluation today patient actually appears to be much better than last time I saw him 2 weeks ago. His wounds are significantly improved and overall I am very pleased in this regard. Fortunately there is no signs of active infection at this time. He is just a couple of days away from completing Cipro. Overall his edema is much better he has been using his lymphedema pumps which I think is also helping at this point. 11/17/2019 on evaluation today patient appears to be doing excellent in regard to his wounds in general. His legs are swollen but not nearly as much as they have been in the past. Fortunately he is tolerating the compression wraps without complication. No fevers, chills, nausea, vomiting, or diarrhea. He does have some erythema however in the distal portion of his right lower extremity specifically around the forefoot and toes there is a little bit of warmth here as well. 11/24/2019 on evaluation today patient appears to be doing well with regard to his right lower extremity I really do not see any open  wounds at this point. His left lower extremity does have several open areas and his right medial thigh also is open. Other than this however overall the patient seems to be making good progress and I am very pleased at this point. 12/01/2019 on evaluation today patient appears to be doing poorly at this point in regard to his left lower extremity has several new blisters despite the fact that we have him in compression wraps. In fact he had a 4-layer compression wrap, his upper thigh wrapped from lymphedema clinic, and a juxta light over top of the 4 layer compression wrap the lymphedema clinic applied and despite all this he still develop blisters underneath. Obviously this does have me concerned about  the fact that unfortunately despite what we are doing to try to get wounds healed he continues to have new areas arise I do not think he is ever good to be at the point where he can realistically just use wraps at home to keep things under control. Typically when we heal him it takes about 1-2 days before he is back in the clinic with severe breakdown and blistering of his lower extremities bilaterally. This is happened numerous times in the past. Unfortunately I think that we may need some help as far as overall fluid overload to kind of limit what we are seeing and get things under better control. 12/08/2019 on evaluation today patient presents for follow-up concerning his ongoing bilateral lower extremity edema. Unfortunately he is still having quite a bit of swelling the compression wraps are controlling this to some degree but he did see Dr. [name] his cardiologist I do have that available for review today as far as the appointment was concerned that was on 12/06/2019. Obviously that she has been 2 days ago. The patient states that he is only been taking the Lasix 80 mg 1 time a day he had told me previously he was taking this twice a day. Nonetheless Dr. [name] recommended this be up to 80 mg 2 times a  day for the patient as he did appear to be fluid overloaded. With that being said the patient states he did this yesterday and he was unable to go anywhere or do anything due to the fact that he was constantly having to urinate. Nonetheless I think that this is still good to be something that is important for him as far as trying to get his edema under control at all things that he is going to be able to just expect his wounds to get under control and things to be better without going through at least a period of time where he is trying to stabilize his fluid management in general and I think increasing the Lasix is likely the first step here. It was also mentioned the possibility that the patient may require metolazone. With that being said he wanted to have the patient take Lasix twice a day first and then reevaluating 2 months to see where things stand. 12/15/2019 upon evaluation today patient appears to be doing regard to his legs although his toes are showing some signs of weeping especially on the left at this point to some degree on the right. There does not appear to be any signs of active infection and overall I do feel like the compression wraps are doing well for him but he has not been able to take the Lasix at home and the increased dose that Dr. [name] recommended. He tells me that just not go to be feasible for him. Nonetheless I think in this case he should probably send a message to Dr. [name] in order to discuss options from the standpoint of possible admission to get the fluid off or otherwise going forward. 12/22/2019 upon evaluation today patient appears to be doing fairly well with regard to his lower extremities at this point. In fact he would be doing excellent if it was not for the fact that his right anterior thigh apparently had an allergic reaction to adhesive tape that he used. The wound itself that we have been monitoring actually appears to be healed. There is a lot of  irritation at this point. 12/29/2019 upon evaluation today patient appears to be doing well in  regard to his lower extremities. His left medial thigh is open and somewhat draining today but this is the only region that is open the right has done much better with the treatment utilizing the steroid cream that I prescribed for him last week. Overall I am pleased in that regard. Fortunately there is no signs of active infection at this time. No fevers, chills, nausea, vomiting, or diarrhea. 01/05/2020 upon evaluation today patient appears to be doing more poorly in regard to his right lower extremity at this point upon evaluation today. Unfortunately he continues to have issues in this regard and I think the biggest issue is controlling his edema. This obviously is not very well controlled at this point is been recommended that he use the Lasix twice a day but he has not been able to do that. Unfortunately I think this is leading to an issue where honestly he is not really able to effectively control his edema and therefore the wounds really are not doing significantly better. I do not think that he is going to be able to keep things under good control unless he is able to control his edema much better. I discussed this again in great detail with him today. 01/12/2020 good news is patient actually appears to be doing quite well today at this point. He does have an appointment with lymphedema clinic tomorrow. His legs appear healed and the toe on the left is almost completely healed. In general I am very pleased with how things stand at this point. 01/19/2020 upon evaluation today patient appears to actually be doing well in regard to his lower extremities there is nothing open at this point. Fortunately he has done extremely well more recently. Has been seeing lymphedema clinic as well. With that being said he has Velcro wraps for his lower legs as well as his upper legs. The only wound really is on his toe  which is the right great toe and this is barely anything even there. With all that being said I think it is good to be appropriate today to go ahead and switch him over to the Velcro compression wraps. 01/26/2020 upon evaluation today patient appears to be doing worse with regard to his lower extremities after last week switch him to Velcro compression wraps. Unfortunately he lasted less than 24 hours he did not have the sock portion of his Velcro wrap on the left leg and subsequently developed a blister underneath the Velcro portion. Obviously this is not good and not what we were looking for at this point. He states the lymphedema clinic did tell him to wear the wrap for 23 hours and take him off for 1 I am okay with that plan but again right now we got a get things back under control again he may have some cellulitis noted as well. 02/02/2020 upon evaluation today patient unfortunately appears to have several areas of blistering on his bilateral lower extremities today mainly on the feet. His legs do seem to be doing somewhat better which is good news. Fortunately there is no evidence of active infection at this time. No fevers, chills, nausea, vomiting, or diarrhea. 02/16/2020 upon evaluation today patient appears to be doing well at this time with regard to his legs. He has a couple weeping areas on his toes but for the most part everything is doing better and does appear to be sealed up on his legs which is excellent news. We can continue with wrapping him at this point as  he had every time we discontinue the wraps he just breaks out with new wounds. There is really no point in is going forward with this at this point. 03/08/2020 upon evaluation today patient actually appears to be doing quite well with regard to his lower extremity ulcers. He has just a very superficial and really almost nonexistent blister on the left lower extremity he has in general done very well with the compression wraps. With  that being said I do not see any signs of infection at this time which is good news. 03/29/2020 upon evaluation today patient appears to be doing well with regard to his wounds currently except for where he had several new areas that opened up due to some of the wrap slipping and causing him trouble. He states he did not realize they had slipped. Nonetheless he has a 1 area on the right and 3 new areas on the left. Fortunately there is no signs of active infection at this time which is great news. 04/05/2020 upon evaluation today patient actually appears to be doing quite well in general in regard to his legs currently. Fortunately there is no signs of active infection at this time. No fevers, chills, nausea, vomiting, or diarrhea. He tells me next week that he will actually be seen in the lymphedema clinic on Thursday at 10 AM I see him on Wednesday next week. 04/12/2020 upon evaluation today patient appears to be doing very well with regard to his lower extremities bilaterally. In fact he does not appear to have any open wounds at this point which is good news. Fortunately there is no signs of active infection at this time. No fevers, chills, nausea, vomiting, or diarrhea. 04/19/2020 upon evaluation today patient appears to be doing well with regard to his wounds currently on the bilateral lower extremities. There does not appear to be any signs of active infection at this time. Fortunately there is no evidence of systemic infection and overall very pleased at this point. Nonetheless after I held him out last week he literally had blisters the next morning already which swelled up with him being right back here in the clinic. Overall I think that he is just not can be able to be discharged with his legs the way they are he is much to volume overloaded as far as fluid is concerned and that was discussed with him today of also discussed this but should try the clinic nurse manager as well as Dr.  Leanord Hawking. 04/26/2020 upon evaluation today patient appears to be doing better with regard to his wounds currently. He is making some progress and overall swelling is under good control with the compression wraps. Fortunately there is no evidence of active infection at this time. 05/10/2020 on evaluation today patient appears to be doing overall well in regard to his lower extremities bilaterally. He is Tolerating the compression wraps without complication and with what we are seeing currently I feel like that he is making excellent progress. There is no signs of active infection at this time. 05/24/2020 upon evaluation today patient appears to be doing well in regard to his legs. The swelling is actually quite a bit down compared to where it has been in the past. Fortunately there is no sign of active infection at this time which is also good news. With that being said he does have several wounds on his toes that have opened up at this point. 05/31/2020 upon evaluation today patient appears to be doing well with  regard to his legs bilaterally where he really has no significant fluid buildup at this point overall he seems to be doing quite well. Very pleased in this regard. With regard to his toes these also seem to be drying up which is excellent. We have continue to wrap him as every time we tried as a transition to the juxta light wraps things just do not seem to get any better. 06/07/2020 upon evaluation today patient appears to be doing well with regard to his right leg at this point. Unfortunately left leg has a lot of blistering he tells me the wrap started to slide down on him when he tried to put his other Velcro wrap over top of it to help keep things in order but nonetheless still had some issues. 06/14/2020 on evaluation today patient appears to be doing well with regard to his lower extremity ulcers and foot ulcers at this point. I feel like everything is actually showing signs of improvement which  is great news overall there is no signs of active infection at this time. No fevers, chills, nausea, vomiting, or diarrhea. 06/21/2020 on evaluation today patient actually appears to be doing okay in regard to his wounds in general. With that being said the biggest issue I see is on his right foot in particular the first and second toe seem to be doing a little worse due to the fact this is staying very wet. I think he is probably getting need to change out his dressings a couple times in between each week when we see him in regard to his toes in order to keep this drier based on the location and how this is proceeding. 06/28/2020 on evaluation today patient appears to be doing a little bit more poorly overall in regard to the appearance of the skin I am actually somewhat concerned about the possibility of him having a little bit of an infection here. We discussed the course of potentially giving him a doxycycline prescription which he is taken previously with good result. With that being said I do believe that this is potentially mild and at this point easily fixed. I just do not want anything to get any worse. 07/12/2020 upon evaluation today patient actually appears to be making some progress with regard to his legs which is great news there does not appear to be any evidence of active infection. Overall very pleased with where things stand. 07/26/2020 upon evaluation today patient appears to be doing well with regard to his leg ulcers and toe ulcers at this point. He has been tolerating the compression wraps without complication overall very pleased in this regard. 08/09/2020 upon evaluation today patient appears to be doing well with regard to his lower extremities bilaterally. Fortunately there is no signs of active infection overall I am pleased with where things stand. 08/23/2020 on evaluation today patient appears to be doing well with regard to his wound. He has been tolerating the dressing  changes without complication. Fortunately there is no signs of active infection at this time. Overall his legs seem to be doing quite well which is great news and I am very pleased in that regard. No fevers, chills, nausea, vomiting, or diarrhea. Electronic Signature(s) Signed: 08/23/2020 11:32:25 AM By: Lenda Kelp PA-C Entered By: Lenda Kelp on 08/23/2020 11:32:25 -------------------------------------------------------------------------------- Physical Exam Details Patient Name: Date of Service: [name], Ibraham J. 08/23/2020 10:30 A M Medical Record Number: [id number] Patient Account Number: [id number] Date of Birth/Sex: Treating RN: February 05, 1951 (  69 y.o. [name] Primary Care Provider: Nicoletta Ba Other Clinician: Referring Provider: Treating Provider/Extender: Adele Dan in Treatment: 239 Constitutional Obese and well-hydrated in no acute distress. Respiratory normal breathing without difficulty. Psychiatric this patient is able to make decisions and demonstrates good insight into disease process. Alert and Oriented x 3. pleasant and cooperative. Notes Patient's wounds currently again are showing signs of being for the most part close there is no significant openings he does have some areas of leaking here and there but nothing too dramatic. Overall I am very pleased with where things stand at this time. The dry skin on the top of his foot could potentially be benefited by the use of 40% urea cream. Electronic Signature(s) Signed: 08/23/2020 11:33:04 AM By: Lenda Kelp PA-C Entered By: Lenda Kelp on 08/23/2020 11:33:04 -------------------------------------------------------------------------------- Physician Orders Details Patient Name: Date of Service: [name], Trevyon J. 08/23/2020 10:30 A M Medical Record Number: [id number] Patient Account Number: [id number] Date of Birth/Sex: Treating RN: 10-15-1951 (69 y.o. 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 - with Leonard Schwartz Nurse Visit: - 1 week Dressing Change Frequency Wound #183 Left T Second oe Do not change entire dressing for one week. Skin Barriers/Peri-Wound Care Moisturizing lotion - both legs and feet and toes Other: - patient to get urea cream 40% to be used on feet and toes Wound Cleansing May shower with protection. Primary Wound Dressing Wound #183 Left T Second oe lginate with Silver - to any blisters or weeping areas Calcium A Secondary Dressing Wound #183 Left T Second oe Kerlix/Rolled Gauze - secure with coban Dry Gauze Edema Control 4 layer compression: Left lower extremity Unna Boot to Right Lower Extremity Avoid standing for long periods of time Elevate legs to the level of the heart or above for 30 minutes daily and/or when sitting, a frequency of: - throughout the day Exercise regularly Electronic Signature(s) Signed: 08/23/2020 4:35:17 PM By: Lenda Kelp PA-C Signed: 08/23/2020 4:55:29 PM By: Zenaida Deed RN, BSN Entered By: Zenaida Deed on 08/23/2020 11:28:32 -------------------------------------------------------------------------------- Problem List Details Patient Name: Date of  Service: [name], Malekai J. 08/23/2020 10:30 A M Medical Record Number: [id number] Patient Account Number: [id number] Date of Birth/Sex: Treating RN: Mar 01, 1951 (69 y.o. [name] Primary Care Provider: Nicoletta Ba Other Clinician: Referring Provider: Treating Provider/Extender: Adele Dan in Treatment: [phone] Active Problems ICD-10 Encounter Code Description Active Date MDM Diagnosis 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 skin 06/30/2018 No Yes L97.221 Non-pressure chronic ulcer of left calf limited to breakdown of skin 09/30/2016 No Yes 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 of 01/22/2016 No Yes bilateral lower extremity I89.0 Lymphedema, not elsewhere classified 01/22/2016 No Yes E11.622 Type 2 diabetes mellitus with other skin ulcer 01/22/2016 No Yes 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, unspecified 01/22/2016 No  Yes L03.116 Cellulitis of left lower limb 04/01/2017 No Yes Inactive Problems ICD-10 Code Description Active Date Inactive Date 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 skin 06/30/2017 06/30/2017 L97.521 Non-pressure chronic ulcer of other 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 of skin 04/27/2018 04/27/2018 L03.115 Cellulitis of right lower limb 12/22/2017 12/22/2017 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 06/30/2018 06/30/2018 L97.511 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 of skin 06/30/2018 06/30/2018 Resolved Problems Electronic Signature(s) Signed: 08/23/2020 10:29:22 AM By: Lenda Kelp PA-C Entered By: Lenda Kelp on 08/23/2020 10:29:20 -------------------------------------------------------------------------------- Progress Note Details Patient Name: Date of Service: [name], Devian J. 08/23/2020 10:30 A M Medical Record Number:  [id number] Patient Account Number: [id number] Date of Birth/Sex: Treating RN: 05/30/1951 (69 y.o. [name] Primary Care Provider: Nicoletta Ba Other Clinician: Referring Provider: Treating Provider/Extender: Adele Dan in Treatment: 239 Subjective Chief Complaint Information obtained from Patient patient is here for evaluation venous/lymphedema weeping History of Present Illness (HPI) Referred by PCP for consultation. Patient has long standing history of BLE venous stasis, no prior ulcerations. At beginning of month, developed cellulitis and weeping. Received IM Rocephin followed by Keflex and resolved. Wears compression stocking, appr 6 months old. Not sure strength. No present drainage. 01/22/16 this is a patient who is a type II diabetic on insulin. 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 He tells me he religiously wears pressure stockings of uncertain strength. He was here with weeping edema about 8 months ago but did not have an open wound. Roughly a month ago he had a reopening on his bilateral legs. He is been using bandages and Neosporin. He does not complain of pain. 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 Last BUN/creatinine I have is from 11/20/15 at 13 and 1.0 respectively 01/29/16; patient arrives today having tolerated the Profore wrap. He brought in his stockings and these are 18 mmHg stockings he bought from [place]. The compression here is likely inadequate. He does not complain of pain or excessive drainage she has no systemic symptoms. The wound on the right looks improved as does the one on the left although one on the left is more substantial with still tissue at risk below the actual wound area on the bilateral posterior calf 02/05/16; patient arrives with poor edema control. He states that we  did put a 4 layer compression on it last week. No weight appear 5 this. 02/12/16; the area on the posterior right Has healed. The left Has a substantial wound that has necrotic surface eschar that requires a debridement with a curette. 02/16/16;the patient called or a Nurse visit secondary to increased swelling. He had been in earlier in the week with his right leg healed. He was transitioned to is on pressure stocking on the right leg with the only open wound on the left, a substantial area on the left posterior calf. Note he has a history of severe lower extremity edema, he has a history of chronic atrial fibrillation but not heart failure per my notes but I'll need to research this. He is not complaining of chest pain shortness of breath or orthopnea. The intake nurse noted blisters on the previously closed right leg 02/19/16; this is the patient's regular visit day. I see him on Friday  with escalating edema new wounds on the right leg and clear signs of at least right ventricular heart failure. I increased his Lasix to 40 twice a day. He is returning currently in follow-up. States he is noticed a decrease in that the edema 02/26/16 patient's legs have much less edema. There is nothing really open on the right leg. The left leg has improved condition of the large superficial wound on the posterior left leg 03/04/16; edema control is very much better. The patient's right leg wounds have healed. On the left leg he continues to have severe venous inflammation on the posterior aspect of the left leg. There is no tenderness and I don't think any of this is cellulitis. 03/11/16; patient's right leg is married healed and he is in his own stocking. The patient's left leg has deteriorated somewhat. There is a lot of erythema around the wound on the posterior left leg. There is also a significant rim of erythema posteriorly just above where the wrap would've ended there is a new wound in this location and a lot of  tenderness. Can't rule out cellulitis in this area. 03/15/16; patient's right leg remains healed and he is in his own stocking. The patient's left leg is much better than last review. His major wound on the posterior aspect of his left Is almost fully epithelialized. He has 3 small injuries from the wraps. Really. Erythema seems a lot better on antibiotics 03/18/16; right leg remains healed and he is in his own stocking. The patient's left leg is much better. The area on the posterior aspect of the left calf is fully epithelialized. His 3 small injuries which were wrap injuries on the left are improved only one seems still open his erythema has resolved 03/25/16; patient's right leg remains healed and he is in his own stocking. There is no open area today on the left leg posterior leg is completely closed up. His wrap injuries at the superior aspect of his leg are also resolved. He looks as though he has some irritation on the dorsal ankle but this is fully epithelialized without evidence of infection. 03/28/16; we discharged this patient on Monday. Transitioned him into his own stocking. There were problems almost immediately with uncontrolled swelling weeping edema multiple some of which have opened. He does not feel systemically unwell in particular no chest pain no shortness of breath and he does not feel 04/08/16; the edema is under better control with the Profore light wrap but he still has pitting edema. There is one large wound anteriorly 2 on the medial aspect of his left leg and 3 small areas on the superior posterior calf. Drainage is not excessive he is tolerating a Profore light well 04/15/16; put a Profore wrap on him last week. This is controlled is edema however he had a lot of pain on his left anterior foot most of his wounds are healed 04/22/16 once again the patient has denuded areas on the left anterior foot which he states are because his wrap slips up word. He saw his primary  physician today is on Lasix 40 twice a day and states that he his weight is down 20 pounds over the last 3 months. 04/29/16: Much improved. left anterior foot much improved. He is now on Lasix 80 mg per day. Much improved edema control 05/06/16; I was hoping to be able to discharge him today however once again he has blisters at a low level of where the compression was placed last week  mostly on his left lateral but also his left medial leg and a small area on the anterior part of the left foot. 05/09/16; apparently the patient went home after his appointment on 7/4 later in the evening developing pain in his upper medial thigh together with subjective fever and chills although his temperature was not taken. The pain was so intense he felt he would probably have to call 911. However he then remembered that he had leftover doxycycline from a previous round of antibiotics and took these. By the next morning he felt a lot better. He called and spoke to one of our nurses and I approved doxycycline over the phone thinking that this was in relation to the wounds we had previously seen although they were definitely were not. The patient feels a lot better old fever no chills he is still working. Blood sugars are reasonably controlled 05/13/16; patient is back in for review of his cellulitis on his anterior medial upper thigh. He is taking doxycycline this is a lot better. Culture I did of the nodular area on the dorsal aspect of his foot grew MRSA this also looks a lot better. 05/20/16; the patient is cellulitis on the medial upper thigh has resolved. All of his wound areas including the left anterior foot, areas on the medial aspect of the left calf and the lateral aspect of the calf at all resolved. He has a new blister on the left dorsal foot at the level of the fourth toe this was excised. No evidence of infection 05/27/16; patient continues to complain weeping edema. He has new blisterlike wounds on the left  anterior lateral and posterior lateral calf at the top of his wrap levels. The area on his left anterior foot appears better. He is not complaining of fever, pain or pruritus in his feet. 05/30/16; the patient's blisters on his left anterior leg posterior calf all look improved. He did not increase the Lasix 100 mg as I suggested because he was going to run out of his 40 mg tablets. He is still having weeping edema of his toes 06/03/16; I renewed his Lasix at 80 mg once a day as he was about to run out when I last saw him. He is on 80 mg of Lasix now. I have asked him to cut down on the excessive amount of water he was drinking and asked him to drink according to his thirst mechanisms 06/12/2016 -- was seen 2 days ago and was supposed to wear his compression stockings at home but he is developed lymphedema and superficial blisters on the left lower extremity and hence came in for a review 06/24/16; the remaining wound is on his left anterior leg. He still has edema coming from between his toes. There is lymphedema here however his edema is generally better than when I last saw this. He has a history of atrial fibrillation but does not have a known history of congestive heart failure nevertheless I think he probably has this at least on a diastolic basis. 07/01/16 I reviewed his echocardiogram from January 2017. This was essentially normal. He did not have LVH, EF of 55-60%. His right ventricular function was normal although he did have trivial tricuspid and pulmonic regurgitation. This is not audible on exam however. I increased his Lasix to do massive edema in his legs well above his knees I think in early July. He was also drinking an excessive amount of water at the time. 07/15/16; missed his appointment last week  because of the Labor Day holiday on Monday. He could not get another appointment later in the week. Started to feel the wrap digging in superiorly so we remove the top half and the bottom half  of his wrap. He has extensive erythema and blistering superiorly in the left leg. Very tender. Very swollen. Edema in his foot with leaking edema fluid. He has not been systemically unwell 07/22/16; the area on the left leg laterally required some debridement. The medial wounds look more stable. His wrap injury wounds appear to have healed. Edema and his foot is better, weeping edema is also better. He tells me he is meeting with the supplier of the external compression pumps at work 08/05/16; the patient was on vacation last week in [facility]. His wrap is been on for an extended period of time. Also over the weekend he developed an extensive area of tender erythema across his anterior medial thigh. He took to doxycycline yesterday that he had leftover from a previous prescription. The patient complains of weeping edema coming out of his toes 08/08/16; I saw this patient on 10/2. He was tender across his anterior thigh. I put him on doxycycline. He returns today in follow-up. He does not have any open wounds on his lower leg, he still has edema weeping into his toes. 08/12/16; patient was seen back urgently today to follow-up for his extensive left thigh cellulitis/erysipelas. He comes back with a lot less swelling and erythema pain is much better. I believe I gave him Augmentin and Cipro. His wrap was cut down as he stated a roll down his legs. He developed blistering above the level of the wrap that remained. He has 2 open blisters and 1 intact. 08/19/16; patient is been doing his primary doctor who is increased his Lasix from 40-80 once a day or 80 already has less edema. Cellulitis has remained improved in the left thigh. 2 open areas on the posterior left calf 08/26/16; he returns today having new open blisters on the anterior part of his left leg. He has his compression pumps but is not yet been shown how to use some vital representative from the supplier. 09/02/16 patient returns today with no  open wounds on the left leg. Some maceration in his plantar toes 09/10/2016 -- Dr. Leanord Hawking had recently discharged him on 09/02/2016 and he has come right back with redness swelling and some open ulcers on his left lower extremity. He says this was caused by trying to apply his compression stockings and he's been unable to use this and has not been able to use his lymphedema pumps. He had some doxycycline leftover and he has started on this a few days ago. 09/16/16; there are no open wounds on his leg on the left and no evidence of cellulitis. 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 He does not complain of symptoms here. I am not clear use using his external compression pumps. 09/23/16; I have not seen this patient in 2 weeks. He canceled his appointment 10 days ago as he was going on vacation. He tells me that on Monday he noticed a large area on his posterior left leg which is been draining copiously and is reopened into a large wound. He is been using ABDs and the external part of his juxtalite, according to our nurse this was not on properly. 10/07/16; Still a substantial area on the posterior left leg. Using silver alginate 10/14/16; in  general better although there is still open area which looks healthy. Still using silver alginate. He reminds me that this happen before he left for [facility]. T oday while he was showering in the morning. He had been using his juxtalite's 10/21/16; the area on his posterior left leg is fully epithelialized. However he arrives today with a large area of tender erythema in his medial and posterior left thigh just above the knee. I have marked the area. Once again he is reluctant to consider hospitalization. I treated him with oral antibiotics in the past for a similar situation with resolution I think with doxycycline however this area it seems more extensive to me. He is not complaining of fever but does  have chills and says states he is thirsty. His blood sugar today was in the 140s at home 10/25/16 the area on his posterior left leg is fully epithelialized although there is still some weeping edema. The large area of tenderness and erythema in his medial and posterior left thigh is a lot less tender although there is still a lot of swelling in this thigh. He states he feels a lot better. He is on doxycycline and Augmentin that I started last week. This will continued until Tuesday, December 26. I have ordered a duplex ultrasound of the left thigh rule out DVT whether there is an abscess something that would need to be drained I would also like to know. 11/01/16; he still has weeping edema from a not fully epithelialized area on his left posterior calf. Most of the rest of this looks a lot better. He has completed his antibiotics. His thigh is a lot better. Duplex ultrasound did not show a DVT in the thigh 11/08/16; he comes in today with more Denuded surface epithelium from the posterior aspect of his calf. There is no real evidence of cellulitis. The superior aspect of his wrap appears to have put quite an indentation in his leg just below the knee and this may have contributed. He does not complain of pain or fever. We have been using silver alginate as the primary dressing. The area of cellulitis in the right thigh has totally resolved. He has been using his compression stockings once a week 11/15/16; the patient arrives today with more loss of epithelium from the posterior aspect of his left calf. He now has a fairly substantial wound in this area. The reason behind this deterioration isn't exactly clear although his edema is not well controlled. He states he feels he is generally more swollen systemically. He is not complaining of chest pain shortness of breath fever. T me he has an appointment with his primary physician in early February. He is on 80 mg of oral ells Lasix a day. He claims  compliance with the external compression pumps. He is not having any pain in his legs similar to what he has with his recurrent cellulitis 11/22/16; the patient arrives a follow-up of his large area on his left lateral calf. This looks somewhat better today. He came in earlier in the week for a dressing change since I saw him a week ago. He is not complaining of any pain no shortness of breath no chest pain 11/28/16; the patient arrives for follow-up of his large area on the left lateral calf this does not look better. In fact it is larger weeping edema. The surface of the wound does not look too bad. We have been using silver alginate although I'm not certain that this is  a dressing issue. 12/05/16; again the patient follows up for a large wound on the left lateral and left posterior calf this does not look better. There continues to be weeping edema necrotic surface tissue. More worrisome than this once again there is erythema below the wound involving the distal Achilles and heel suggestive of cellulitis. He is on his feet working most of the day of this is not going well. We are changing his dressing twice a week to facilitate the drainage. 12/12/16; not much change in the overall dimensions of the large area on the left posterior calf. This is very inflamed looking. I gave him an. Doxycycline last week does not really seem to have helped. He found the wrap very painful indeed it seems to of dog into his legs superiorly and perhaps around the heel. He came in early today because the drainage had soaked through his dressings. 12/19/16- patient arrives for follow-up evaluation of his left lower extremity ulcers. He states that he is using his lymphedema pumps once daily when there is "no drainage". He admits to not using his lipedema pumps while under current treatment. His blood sugars have been consistently between 150-200. 12/26/16; the patient is not using his compression pumps at home because of the  wetness on his feet. I've advised him that I think it's important for him to use this daily. He finds his feet too wet, he can put a plastic bag over his legs while he is in the pumps. Otherwise I think will be in a vicious circle. We are using silver alginate to the major area on his left posterior calf 01/02/17; the patient's posterior left leg has further of all into 3 open wounds. All of them covered with a necrotic surface. He claims to be using his compression pumps once a day. His edema control is marginal. Continue with silver alginate 01/10/17; the patient's left posterior leg actually looks somewhat better. There is less edema, less erythema. Still has 3 open areas covered with a necrotic surface requiring debridement. He claims to be using his compression pumps once a day his edema control is better 01/17/17; the patient's left posterior calf look better last week when I saw him and his wrap was changed 2 days ago. He has noted increasing pain in the left heel and arrives today with much larger wounds extensive erythema extending down into the entire heel area especially tender medially. He is not systemically unwell CBGs have been controlled no fever. Our intake nurse showed me limegreen drainage on his AVD pads. 01/24/17; his usual this patient responds nicely to antibiotics last week giving him Levaquin for presumed Pseudomonas. The whole entire posterior part of his leg is much better much less inflamed and in the case of his Achilles heel area much less tender. He has also had some epithelialization posteriorly there are still open areas here and still draining but overall considerably better 01/31/17- He has continue to tolerate the compression wraps. he states that he continues to use the lymphedema pumps daily, and can increase to twice daily on the weekends. He is voicing no complaints or concerns regarding his LLE ulcers 02/07/17-he is here for follow-up evaluation. He states that he noted  some erythema to the left medial and anterior thigh, which he states is new as of yesterday. He is concerned about recurrent cellulitis. He states his blood sugars have been slightly elevated, this morning in the 180s 02/14/17; he is here for follow-up evaluation. When he was last  here there was erythema superiorly from his posterior wound in his anterior thigh. He was prescribed Levaquin however a culture of the wound surface grew MRSA over the phone I changed him to doxycycline on Monday and things seem to be a lot better. 02/24/17; patient missed his appointment on Friday therefore we changed his nurse visit into a physician visit today. Still using silver alginate on the large area of the posterior left thigh. He isn't new area on the dorsal left second toe 03/03/17; actually better today although he admits he has not used his external compression pumps in the last 2 days or so because of work responsibilities over the weekend. 03/10/17; continued improvement. External compression pumps once a day almost all of his wounds have closed on the posterior left calf. Better edema control 03/17/17; in general improved. He still has 3 small open areas on the lateral aspect of his left leg however most of the area on the posterior part of his leg is epithelialized. He has better edema control. He has an ABD pad under his stocking on the right anterior lower leg although he did not let us look at that today. 03/24/17; patient arrives back in clinic today with no open areas however there are areas on the posterior left calf and anterior left calf that are less than 100% epithelialized. His edema is well controlled in the left lower leg. There is some pitting edema probably lymphedema in the left upper thigh. He uses compression pumps at home once per day. I tried to get him to do this twice a day although he is very reticent. 04/01/2017 -- for the last 2 days he's had significant redness, tenderness and weeping and  came in for an urgent visit today. 04/07/17; patient still has 6 more days of doxycycline. He was seen by Dr. Meyer Russel last Wednesday for cellulitis involving the posterior aspect, lateral aspect of his Involving his heel. For the most part he is better there is less erythema and less weeping. He has been on his feet for 12 hours o2 over the weekend. Using his compression pumps once a day 04/14/17 arrives today with continued improvement. Only one area on the posterior left calf that is not fully epithelialized. He has intense bilateral venous inflammation associated with his chronic venous insufficiency disease and secondary lymphedema. We have been using silver alginate to the left posterior calf wound In passing he tells [place] today that the right leg but we have not seen in quite some time has an open area on it but he doesn't want [place] to look at this today states he will show this to [place] next week. 04/21/17; there is no open area on his left leg although he still reports some weeping edema. He showed [place] his right leg today which is the first time we've seen this leg in a long time. He has a large area of open wound on the right leg anteriorly healthy granulation. Quite a bit of swelling in the right leg and some degree of venous inflammation. He told us about the right leg in passing last week but states that deterioration in the right leg really only happened over the weekend 04/28/17; there is no open area on the left leg although there is an irritated part on the posterior which is like a wrap injury. The wound on the right leg which was new from last week at least to [place] is a lot better. 05/05/17; still no open area on the left  leg. Patient is using his new compression stocking which seems to be doing a good job of controlling the edema. He states he is using his compression pumps once per day. The right leg still has an open wound although it is better in terms of surface area. Required debridement. A  lot of pain in the posterior right Achilles marked tenderness. Usually this type of presentation this patient gives concern for an active cellulitis 05/12/17; patient arrives today with his major wound from last week on the right lateral leg somewhat better. Still requiring debridement. He was using his compression stocking on the left leg however that is reopened with superficial wounds anteriorly he did not have an open wound on this leg previously. He is still using his juxta light's once daily at night. He cannot find the time to do this in the morning as he has to be at work by 7 AM 05/19/17; right lateral leg wound looks improved. No debridement required. The concerning area is on the left posterior leg which appears to almost have a subcutaneous hemorrhagic component to it. We've been using silver alginate to all the wounds 05/26/17; the right lateral leg wound continues to look improved. However the area on the left posterior calf is a tightly adherent surface. [place] using silver alginate. Because of the weeping edema in his legs there is very little good alternatives. 06/02/17; the patient left here last week looking quite good. Major wound on the left posterior calf and a small one on the right lateral calf. Both of these look satisfactory. He tells me that by Wednesday he had noted increased pain in the left leg and drainage. He called on Thursday and Friday to get an appointment here but we were blocked. He did not go to urgent care or his primary physician. He thinks he had a fever on Thursday but did not actually take his temperature. He has not been using his compression pumps on the left leg because of pain. I advised him to go to the emergency room today for IV antibiotics for stents of left leg cellulitis but he has refused I have asked him to take 2 days off work to keep his leg elevated and he has refused this as well. In view of this I'm going to call him and Augmentin and  doxycycline. 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 However after a detailed discussion regarding the risk benefits and alternatives he refuses to get admitted to the hospital. With no other recourse I will continue him on oral antibiotics as before and hopefully he'll have his infectious disease consultation this week. 06/16/2017 -- the patient was seen today by the nurse practitioner at infectious disease Ms. Dixon. 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 He has also been advise Lotrimin cream locally. she also had wise IV antibiotics for his condition if it flares up 06/23/17; patient arrives today with drainage bilaterally although the remaining wound on the left posterior calf after cleaning up today "highlighter yellow drainage" did not look too bad. Unfortunately he has  had breakdown on the right anterior leg [previously this leg had not been open and he is using a black stocking] he went to see infectious disease and is been put on clindamycin 150 mg daily, I did not verify the dose although I'm not familiar with using clindamycin in this dosing range, perhaps for prophylaxisoo 06/27/17; I brought this patient back today to follow-up on the wound deterioration on the right lower leg together with surrounding cellulitis. I started him on doxycycline 4 days ago. This area looks better however he comes in today with intense cellulitis on the medial part of his left thigh. This is not have a wound in this area. Extremely tender. We've been using silver alginate to the wounds  on the right lower leg left lower leg with bilateral 4 layer compression he is using his external compression pumps once a day 07/04/17; patient's left medial thigh cellulitis looks better. He has not been using his compression pumps as his insert said it was contraindicated with cellulitis. His right leg continues to make improvements all the wounds are still open. We only have one remaining wound on the left posterior calf. Using silver alginate to all open areas. He is on doxycycline which I started a week ago and should be finishing I gave him Augmentin after Thursday's visit for the severe cellulitis on the left medial thigh which fortunately looks better 07/14/17; the patient's left medial thigh cellulitis has resolved. The cellulitis in his right lower calf on the right also looks better. All of his wounds are stable to improved we've been using silver alginate he has completed the antibiotics I have given him. He has clindamycin 150 mg once a day prescribed by infectious disease for prophylaxis, I've advised him to start this now. We have been using bilateral Unna boots over silver alginate to the wound areas 07/21/17; the patient is been to see infectious disease who noted his recurrent problems with cellulitis. He was not able to tolerate prophylactic clindamycin therefore he is on amoxicillin 500 twice a day. He also had a second daily dose of Lasix added By Dr. Oneta Rack but he is not taking this. Nor is he being completely compliant with his compression pumps a especially not this week. He has 2 remaining wounds one on the right posterior lateral lower leg and one on the left posterior medial lower leg. 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 The patient has Unna boots bilaterally. Still wounds on his right lateral, left medial, and a new open area on the left anterior lateral lower leg 08/04/17; he remains on  amoxicillin twice a day for prophylaxis of recurrent cellulitis. He has bilateral Unna boots for compression and silver alginate to his wounds. Arrives today with his legs looking as good as I have seen him in quite some time. Not surprisingly his wounds look better as well with improvement on the right lateral leg venous insufficiency wound and also the left medial leg. He is still using the compression pumps once a day 08/11/17; both legs appear to be doing better wounds on the right lateral and left medial legs look better. Skin on the right leg quite good. He is been using silver alginate as the primary dressing. I'm going to use Anasept gel calcium alginate and maintain all the secondary dressings 08/18/17; the patient continues to actually do quite well. The area on his right lateral leg is just about closed the  left medial also looks better although it is still moist in this area. His edema is well controlled we have been using Anasept gel with calcium alginate and the usual secondary dressings, 4 layer compression and once daily use of his compression pumps "always been able to manage 09/01/17; the patient continues to do reasonably well in spite of his trip to [place]. The area on the right lateral leg is epithelialized. Left is much better but still open. He has more edema and more chronic erythema on the left leg [venous inflammation] 09/08/17; he arrives today with no open wound on the right lateral leg and decently controlled edema. Unfortunately his left leg is not nearly as in his good situation as last week.he apparently had increasing edema starting on Saturday. He edema soaked through into his foot so used a plastic bag to walk around his home. The area on the medial right leg which was his open area is about the same however he has lost surface epithelium on the left lateral which is new and he has significant pain in the Achilles area of the left foot. He is already on amoxicillin  chronically for prophylaxis of cellulitis in the left leg 09/15/17; he is completed a week of doxycycline and the cellulitis in the left posterior leg and Achilles area is as usual improved. He still has a lot of edema and fluid soaking through his dressings. There is no open wound on the right leg. He saw infectious disease NP today 09/22/17;As usual 1 we transition him from our compression wraps to his stockings things did not go well. He has several small open areas on the right leg. He states this was caused by the compression wrap on his skin although he did not wear this with the stockings over them. He has several superficial areas on the left leg medially laterally posteriorly. He does not have any evidence of active cellulitis especially involving the left Achilles The patient is traveling from [facility] Saturday going to [facility]. He states he isn't attempting to get an appointment with a heel objects wound center there to change his dressings. I am not completely certain whether this will work 10/06/17; the patient came in on Friday for a nurse visit and the nurse reported that his legs actually look quite good. He arrives in clinic today for his regular follow-up visit. He has a new wound on his left third toe over the PIP probably caused by friction with his footwear. He has small areas on the left leg and a very superficial but epithelialized area on the right anterior lateral lower leg. Other than that his legs look as good as I've seen him in quite some time. We have been using silver alginate Review of systems; no chest pain no shortness of breath other than this a 10 point review of systems negative 10/20/17; seen by Dr. Meyer Russel last week. He had taken some antibiotics [doxycycline] that he had left over. Dr. Meyer Russel thought he had candida infection and declined to give him further antibiotics. He has a small wound remaining on the right lateral leg several areas on the left leg  including a larger area on the left posterior several left medial and anterior and a small wound on the left lateral. The area on the left dorsal third toe looks a lot better. ROS; Gen.; no fever, respiratory no cough no sputum Cardiac no chest pain other than this 10 point review of system is negative 10/30/17; patient arrives  today having fallen in the bathtub 3 days ago. It took him a while to get up. He has pain and maceration in the wounds on his left leg which have deteriorated. He has not been using his pumps he also has some maceration on the right lateral leg. 11/03/17; patient continues to have weeping edema especially in the left leg. This saturates his dressings which were just put on on 12/27. As usual the doxycycline seems to take care of the cellulitis on his lower leg. He is not complaining of fever, chills, or other systemic symptoms. He states his leg feels a lot better on the doxycycline I gave him empirically. He also apparently gets injections at his primary doctor's officeo Rocephin for cellulitis prophylaxis. I didn't ask him about his compression pump compliance today I think that's probably marginal. Arrives in the clinic with all of his dressings primary and secondary macerated full of fluid and he has bilateral edema 11/10/17; the patient's right leg looks some better although there is still a cluster of wounds on the right lateral. The left leg is inflamed with almost circumferential skin loss medially to laterally although we are still maintaining anteriorly. He does not have overt cellulitis there is a lot of drainage. He is not using compression pumps. We have been using silver alginate to the wound areas, there are not a lot of options here 11/17/17; the patient's right leg continues to be stable although there is still open wounds, better than last week. The inflammation in the left leg is better. Still loss of surface layer epithelium especially posteriorly. There is no  overt cellulitis in the amount of edema and his left leg is really quite good, tells me he is using his compression pumps once a day. 11/24/17; patient's right leg has a small superficial wound laterally this continues to improve. The inflammation in the left leg is still improving however we have continuous surface layer epithelial loss posteriorly. There is no overt cellulitis in the amount of edema in both legs is really quite good. He states he is using his compression pumps on the left leg once a day for 5 out of 7 days 12/01/17; very small superficial areas on the right lateral leg continue to improve. Edema control in both legs is better today. He has continued loss of surface epithelialization and left posterior calf although I think this is better. We have been using silver alginate with large number of absorptive secondary dressings 4 layer on the left Unna boot on the right at his request. He tells me he is using his compression pumps once a day 12/08/17; he has no open area on the right leg is edema control is good here. ooOn the left leg however he has marked erythema and tenderness breakdown of skin. He has what appears to be a wrap injury just distal to the popliteal fossa. 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 There was swelling erythema and breakdown in the right leg. He has much worse skin breakdown in the left leg as well multiple open areas medially and posteriorly as well as laterally. He tells me he has been using his compression pumps but tells me he feels that the drainage out of  his leg is worse when he uses a compression pumps. T be fair to him he is been saying this o for a while however I don't  know that I have really been listening to this. I wonder if the compression pumps are working properly 12/22/17;. Once again he arrives with severe erythema, weeping edema from the left greater than right leg. Noncompliance with compression pumps. New this visit he is complaining of pain on the lateral aspect of the right leg and the medial aspect of his right thigh. He apparently saw his cardiologist Dr. [name] who was ordered an echocardiogram area and I think this is a step in the right direction 12/25/17; started his doxycycline Monday night. There is still intense erythema of the right leg especially in the anterior thigh although there is less tenderness. The erythema around the wound on the right lateral calf also is less tender. He still complaining of pain in the left heel. His wounds are about the same right lateral left medial left lateral. Superficial but certainly not close to closure. He denies being systemically unwell no fever chills no abdominal pain no diarrhea 12/29/17; back in follow-up of his extensive right calf and right thigh cellulitis. I added amoxicillin to cover possible doxycycline resistant strep. This seems to of done the trick he is in much less pain there is much less erythema and swelling. He has his echocardiogram at 11:00 this morning. X-ray of the left heel was also negative. 01/05/18; the patient arrived with his edema under much better control. Now that he is retired he is able to use his compression pumps daily and sometimes twice a day per the patient. He has a wound on the right leg the lateral wound looks better. Area on the left leg also looks a lot better. He has no evidence of cellulitis in his bilateral thighs I had a quick peak at his echocardiogram. He is in normal ejection fraction and normal left ventricular function. He has moderate pulmonary hypertension moderately reduced right ventricular function. One would have to wonder about chronic sleep apnea  although he says he doesn't snore. He'll review the echocardiogram with his cardiologist. 01/12/18; the patient arrives with the edema in both legs under exemplary control. He is using his compression pumps daily and sometimes twice daily. His wound on the right lateral leg is just about closed. He still has some weeping areas on the posterior left calf and lateral left calf although everything is just about closed here as well. I have spoken with Aldean Baker who is the patient's nurse practitioner and infectious disease. She was concerned that the patient had not understood that the parenteral penicillin injections he was receiving for cellulitis prophylaxis was actually benefiting him. I don't think the patient actually saw that I would tend to agree we were certainly dealing with less infections although he had a serious one last month. 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 He is healed. He'll be placed in juxtalite compression wraps and increase his lymphedema pumps to twice daily. We will follow up again next week to ensure there are no issues with the new regiment. 01/20/18-he is here for evaluation of bilateral lower extremity weeping edema. Yesterday he was placed in compression wrap to the right lower extremity and compression stocking to left lower shrubbery. He states he uses lymphedema pumps last night and again this morning and noted a blister to the left lower extremity. On exam he was noted  to have drainage to the right lower extremity. He will be placed in Unna boots bilaterally and follow-up next week 01/26/18; patient was actually discharged a week ago to his own juxta light stockings only to return the next day with bilateral lower extremity weeping edema.he was placed in bilateral Unna boots. He arrives today with pain in the back of his left leg. There is no open area on the right leg however there is a linear/wrap injury on the left leg  and weeping edema on the left leg posteriorly. I spoke with infectious disease about 10 days ago. They were disappointed that the patient elected to discontinue prophylactic intramuscular penicillin shots as they felt it was particularly beneficial in reducing the frequency of his cellulitis. I discussed this with the patient today. He does not share this view. He'll definitely need antibiotics today. Finally he is traveling to [place] and trauma leaving this Saturday and returning a week later and he does not travel with his pumps. He is going by car 01/30/18; patient was seen 4 days ago and brought back in today for review of cellulitis in the left leg posteriorly. I put him on amoxicillin this really hasn't helped as much as I might like. He is also worried because he is traveling to [facility] trauma by car. Finally we will be rewrapping him. There is no open area on the right leg over his left leg has multiple weeping areas as usual 02/09/18; The same wrap on for 10 days. He did not pick up the last doxycycline I prescribed for him. He apparently took 4 days worth he already had. There is nothing open on his right leg and the edema control is really quite good. He's had damage in the left leg medially and laterally especially probably related to the prolonged use of Unna boots 02/12/18; the patient arrived in clinic today for a nurse visit/wrap change. He complained of a lot of pain in the left posterior calf. He is taking doxycycline that I previously prescribed for him. Unfortunately even though he used his stockings and apparently used to compression pumps twice a day he has weeping edema coming out of the lateral part of his right leg. This is coming from the lower anterior lateral skin area. 02/16/18; the patient has finished his doxycycline and will finish the amoxicillin 2 days. The area of cellulitis in the left calf posteriorly has resolved. He is no longer having any pain. He tells me he is  using his compression pumps at least once a day sometimes twice. 02/23/18; the patient finished his doxycycline and Amoxil last week. On Friday he noticed a small erythematous circle about the size of a quarter on the left lower leg just above his ankle. This rapidly expanded and he now has erythema on the lateral and posterior part of the thigh. This is bright red. Also has an area on the dorsal foot just above his toes and a tender area just below the left popliteal fossa. He came off his prophylactic penicillin injections at his own insistence one or 2 months ago. This is obviously deteriorated since then 03/02/18; patient is on doxycycline and Amoxil. Culture I did last week of the weeping area on the back of his left calf grew group B strep. I have therefore renewed the amoxicillin 500 3 times a day for a further week. He has not been systemically unwell. Still complaining of an area of discomfort right under his left popliteal fossa. There is no open  wound on the right leg. He tells me that he is using his pumps twice a day on most days 03/09/18; patient arrives in clinic today completing his amoxicillin today. The cellulitis on his left leg is better. Furthermore he tells me that he had intramuscular penicillin shots that his primary care office today. However he also states that the wrap on his right leg fell down shortly after leaving clinic last week. He developed a large blister that was present when he came in for a nurse visit later in the week and then he developed intense discomfort around this area.He tells me he is using his compression pumps 03/16/18; the patient has completed his doxycycline. The infectious part of this/cellulitis in the left heel area left popliteal area is a lot better. He has 2 open areas on the right calf. Still areas on the left calf but this is a lot better as well. 03/24/18; the patient arrives complaining of pain in the left popliteal area again. He thinks some of  this is wrap injury. He has no open area on the right leg and really no open area on the left calf either except for the popliteal area. He claims to be compliant with the compression pumps 03/31/18; I gave him doxycycline last week because of cellulitis in the left popliteal area. This is a lot better although the surface epithelium is denuded off and response to this. He arrives today with uncontrolled edema in the right calf area as well as a fingernail injury in the right lateral calf. There is only a few open areas on the left 04/06/18; I gave him amoxicillin doxycycline over the last 2 weeks that the amoxicillin should be completing currently. He is not complaining of any pain or systemic symptoms. The only open areas see has is on the right lateral lower leg paradoxically I cannot see anything on the left lower leg. He tells me he is using his compression pumps twice a day on most days. Silver alginate to the wounds that are open under 4 layer compression 04/13/18; he completed antibiotics and has no new complaints. Using his compression pumps. Silver alginate that anything that's opened 04/20/18; he is using his compression pumps religiously. Silver alginate 4 layer compression anything that's opened. He comes in today with no open wounds on the left leg but 3 on the right including a new one posteriorly. He has 2 on the right lateral and one on the right posterior. He likes Unna boots on the right leg for reasons that aren't really clear we had the usual 4 layer compression on the left. It may be necessary to move to the 4 layer compression on the right however for now I left them in the Unna boots 04/27/18; he is using his compression pumps at least once a day. He has still the wounds on the right lateral calf. The area right posteriorly has closed. He does not have an open wound on the left under 4 layer compression however on the dorsal left foot just proximal to the toes and the left third toe 2  small open areas were identified 05/11/18; he has not uses compression pumps. The areas on the right lateral calf have coalesced into one large wound necrotic surface. On the left side he has one small wound anteriorly however the edema is now weeping out of a large part of his left leg. He says he wasn't using his pumps because of the weeping fluid. I explained to him that  this is the time he needs to pump more 05/18/18; patient states he is using his compression pumps twice a day. The area on the right lateral large wound albeit superficial. On the left side he has innumerable number of small new wounds on the left calf particularly laterally but several anteriorly and medially. All these appear to have healthy granulated base these look like the remnants of blisters however they occurred under compression. The patient arrives in clinic today with his legs somewhat better. There is certainly less edema, less multiple open areas on the left calf and the right anterior leg looks somewhat better as well superficial and a little smaller. However he relates pain and erythema over the last 3-4 days in the thigh and I looked at this today. He has not been systemically unwell no fever no chills no change in blood sugar values 05/25/18; comes in today in a better state. The severe cellulitis on his left leg seems better with the Keflex. Not as tender. He has not been systemically unwell ooHard to find an open wound on the left lower leg using his compression pumps twice a day ooThe confluent wounds on his right lateral calf somewhat better looking. These will ultimately need debridement I didn't do this today. 06/01/18; the severe cellulitis on the left anterior thigh has resolved and he is completed his Keflex. ooThere is no open wound on the left leg however there is a superficial excoriation at the base of the third toe dorsally. Skin on the bottom of his left foot is macerated looking. ooThe left the  wounds on the lateral right leg actually looks some better although he did require debridement of the top half of this wound area with an open curet 06/09/18 on evaluation today patient appears to be doing poorly in regard to his right lower extremity in particular this appears to likely be infected he has very thick purulent discharge along with a bright green tent to the discharge. This makes me concerned about the possibility of pseudomonas. He's also having increased discomfort at this point on evaluation. Fortunately there does not appear to be any evidence of infection spreading to the other location at this time. 06/16/18 on evaluation today patient appears to actually be doing fairly well. His ulcer has actually diminished in size quite significantly at this point which is good news. Nonetheless he still does have some evidence of infection he did see infectious disease this morning before coming here for his appointment. I did review the results of their evaluation and their note today. They did actually have him discontinue the Cipro and initiate treatment with linezolid at this time. He is doing this for the next seven days and they recommended a follow-up in four months with them. He is the keep a log of the need for intermittent antibiotic therapy between now and when he falls back up with infectious disease. This will help them gaze what exactly they need to do to try and help them out. 06/23/18; the patient arrives today with no open wounds on the left leg and left third toe healed. He is been using his compression pumps twice a day. On the right lateral leg he still has a sizable wound but this is a lot better than last time I saw this. 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 Has been using silver alginate under 4 layer compression  06/30/18; the only open wound he has is on the right  lateral leg and this looks healthy. No debridement is required. We have been using silver alginate. He does not have an open wound on the left leg. There is apparently some drainage from the dorsal proximal third toe on the left although I see no open wound here. 07/03/18 on evaluation today patient was actually here just for a nurse visit rapid change. However when he was here on Wednesday for his rat change due to having been healed on the left and then developing blisters we initiated the wrap again knowing that he would be back today for [place] to reevaluate and see were at. Unfortunately he has developed some cellulitis into the proximal portion of his right lower extremity even into the region of his thigh. 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 He was placed on one as what at that point. Nonetheless he is done with that and has been tolerating it well otherwise. Doxycycline which in the past really did not seem to be effective for him. Nonetheless I think the best option may be for [place] to definitely reinitiate the antibiotics for a longer period of time. 07/07/18; since I last saw this patient a week ago he has had a difficult time. At that point he did not have an open wound on his left leg. We transitioned him into juxta light stockings. He was apparently in the clinic the next day with blisters on the left lateral and left medial lower calf. He also had weeping edema fluid. He was put back into a compression wrap. He was also in the clinic on Friday with intense erythema in his right thigh. Per the patient he was started on Bactrim however that didn't work at all in terms of relieving his pain and swelling. He has taken 3 doxycycline that he had left over from last time and that seems to of helped. He has blistering on the right thigh as well. 07/14/18; the erythema on his right thigh has gotten better with doxycycline that he is finishing. The culture  that I did of a blister on the right lateral calf just below his knee grew MRSA resistant to doxycycline. Presumably this cellulitis in the thigh was not related to that although I think this is a bit concerning going forward. He still has an area on the right lateral calf the blister on the right medial calf just below the knee that was discussed above. On the left 2 small open areas left medial and left lateral. Edema control is adequate. He is using his compression pumps twice a day 07/20/18; continued improvement in the condition of both legs especially the edema in his bilateral thighs. He tells me he is been losing weight through a combination of diet and exercise. He is using his compression pumps twice a day. So overall she made to the remaining wounds 07/27/2018; continued improvement in condition of both legs. His edema is well controlled. The area on the right lateral leg is just about closed he had one blisters show up on the medial left upper calf. We have him in 4 layer compression. He is going on a 10-day trip to [place], [place] and [place]. He will be driving. He wants to wear Unna boots because of the lessening amount of constriction. He will not use compression pumps while he is away 08/05/18 on evaluation today patient actually appears to  be doing decently well all things considered in regard to his bilateral lower extremities. The worst ulcer is actually only posterior aspect of his left lower extremity with a four layer compression wrap cut into his leg a couple weeks back. He did have a trip and actually had [employer] for the trip that he is worn since he was last here. Nonetheless he feels like the [employer] actually do better for him his swelling is up a little bit but he also with his trip was not taking his Lasix on a regular set schedule like he was supposed to be. He states that obviously the reason being that he cannot drive and keep going without having to  urinate too frequently which makes it difficult. He did not have his pumps with him while he was away either which I think also maybe playing a role here too. 08/13/2018; the patient only has a small open wound on the right lateral calf which is a big improvement in the last month or 2. He also has the area posteriorly just below the posterior fossa on the left which I think was a wrap injury from several weeks ago. He has no current evidence of cellulitis. He tells me he is back into his compression pumps twice a day. He also tells me that while he was at the laundromat somebody stole a section of his extremitease stockings 08/20/2018; back in the clinic with a much improved state. He only has small areas on the right lateral mid calf which is just about healed. This was is more substantial area for quite a prolonged period of time. He has a small open area on the left anterior tibia. The area on the posterior calf just below the popliteal fossa is closed today. He is using his compression pumps twice a day 08/28/2018; patient has no open wound on the right leg. He has a smattering of open areas on the calf with some weeping lymphedema. More problematically than that it looks as though his wraps of slipped down in his usual he has very angry upper area of edema just below the right medial knee and on the right lateral calf. He has no open area on his feet. The patient is traveling to [facility] next week. I will send him in an antibiotic. We will continue to wrap the right leg. We ordered extremitease stockings for him last week and I plan to transition the right leg to a stocking when he gets home which will be in 10 days time. As usual he is very reluctant to take his pumps with him when he travels 09/07/2018; patient returns from [facility]. He shows me a picture of his left leg in the mid part of his trip last week with intense fire engine erythema. The picture look bad enough I would have  considered sending him to the hospital. Instead he went to the wound care center in [facility]. They did not prescribe him antibiotics but he did take some doxycycline he had leftover from a previous visit. I had given him trimethoprim sulfamethoxazole before he left this did not work according to the patient. This is resulted in some improvement fortunately. He comes back with a large wound on the left posterior calf. Smaller area on the left anterior tibia. Denuded blisters on the dorsal left foot over his toes. Does not have much in the way of wounds on the right leg although he does have a very  tender area on the right posterior area just below the popliteal fossa also suggestive of infection. He promises me he is back on his pumps twice a day 09/15/2018; the intense cellulitis in his left lower calf is a lot better. The wound area on the posterior left calf is also so better. However he has reasonably extensive wounds on the dorsal aspect of his second and third toes and the proximal foot just at the base of the toes. There is nothing open on the right leg 09/22/2018; the patient has excellent edema control in his legs bilaterally. He is using his external compression pumps twice a day. He has no open area on the right leg and only the areas in the left foot dorsally second and third toe area on the left side. He does not have any signs of active cellulitis. 10/06/2018; the patient has good edema control bilaterally. He has no open wound on the right leg. There is a blister in the posterior aspect of his left calf that we had to deal with today. He is using his compression pumps twice a day. There is no signs of active cellulitis. We have been using silver alginate to the wound areas. He still has vulnerable areas on the base of his left first second toes dorsally He has a his extremities stockings and we are going to transition him today into the stocking on the right leg. He is  cautioned that he will need to continue to use the compression pumps twice a day. If he notices uncontrolled edema in the right leg he may need to go to 3 times a day. 10/13/2018; the patient came in for a nurse check on Friday he has a large flaccid blister on the right medial calf just below the knee. We unroofed this. He has this and a new area underneath the posterior mid calf which was undoubtedly a blister as well. He also has several small areas on the right which is the area we put his extremities stocking on. 10/19/2018; the patient went to see infectious disease this morning I am not sure if that was a routine follow-up in any case the doxycycline I had given him was discontinued and started on linezolid. He has not started this. It is easy to look at his left calf and the inflammation and think this is cellulitis however he is very tender in the tissue just below the popliteal fossa and I have no doubt that there is infection going on here. He states the problem he is having is that with the compression pumps the edema goes down and then starts walking the wrap falls down. We will see if we can adhere this. He has 1 or 2 minuscule open areas on the right still areas that are weeping on the posterior left calf, the base of his left second and third toes 10/26/18; back today in clinic with quite of skin breakdown in his left anterior leg. This may have been infection the area below the popliteal fossa seems a lot better however tremendous epithelial loss on the left anterior mid tibia area over quite inexpensive tissue. He has 2 blisters on the right side but no other open wound here. 10/29/2018; came in urgently to see [place] today and we worked him in for review. He states that the 4 layer compression on the right leg caused pain he had to cut it down to roughly his mid calf this caused swelling above the wrap and he has blisters  and skin breakdown today. As a result of the pain he has not  been using his pumps. Both legs are a lot more edematous and there is a lot of weeping fluid. 11/02/18; arrives in clinic with continued difficulties in the right leg> left. Leg is swollen and painful. multiple skin blisters and new open areas especially laterally. He has not been using his pumps on the right leg. 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 He is not systemically unwell. 11/09/2018; the patient claims he is being compliant with his pumps. He is finished the doxycycline I gave him last week. Culture I did of the wound on the right lateral leg showed a few very resistant methicillin staph aureus. This was resistant to doxycycline. Nevertheless he states the pain in the leg is a lot better which makes me wonder if the cultured organism was not really what was causing the problem nevertheless this is a very dangerous organism to be culturing out of any wound. His right leg is still a lot larger than the left. He is using an Radio broadcast assistant on this area, he blames a 4-layer compression for causing the original skin breakdown which I doubt is true however I cannot talk him out of it. We have been using silver alginate to all of these areas which were initially blisters 11/16/2018; patient is being compliant with his external compression pumps at twice a day. Miraculously he arrives in clinic today with absolutely no open wounds. He has better edema control on the left where he has been using 4 layer compression versus wound of wounds on the right and I pointed this out to him. There is no inflammation in the skin in his lower legs which is also somewhat unusual for him. There is no open wounds on the dorsal left foot. He has extremitease stockings at home and I have asked him to bring these in next week. 11/25/18 patient's lower extremity on examination today on the left appears for the most part to be wound free. He does have an open wound on the  lateral aspect of the right lower extremity but this is minimal compared to what I've seen in past. He does request that we go ahead and wrap the left leg as well even though there's nothing open just so hopefully it will not reopen in short order. 1/28; patient has superficial open wounds on the right lateral calf left anterior calf and left posterior calf. His edema control is adequate. 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 We have been using silver alginate as the primary dressing. He claims compliance with his compression pumps 2/4; patient has superficial open wounds on numerous areas of his left calf and again one on the left dorsal foot. The areas on the right lateral calf have healed. The cellulitis that I gave him doxycycline for last week is also resolved this was mostly on the left anterior calf just below the tibial tuberosity. His edema looks fairly well-controlled. He tells me he went to see his primary doctor today and had blood work ordered 2/11; once again he has several open areas on the left calf left tibial area. Most of these are small and appear to have healthy granulation. He does not have anything open on the right. The edema and control in his thighs is pretty good which is usually a good indication  he has been using his pumps as requested. 2/18; he continues to have several small areas on the left calf and left tibial area. Most of these are small healthy granulation. We put him in his stocking on the right leg last week and he arrives with a superficial open area over the right upper tibia and a fairly large area on the right lateral tibia in similar condition. His edema control actually does not look too bad, he claims to be using his compression pumps twice a day 2/25. Continued small areas on the left calf and left tibial area. New areas especially on the right are identified just  below the tibial tuberosity and on the right upper tibia itself. There are also areas of weeping edema fluid even without an obvious wound. He does not have a considerable degree of lymphedema but clearly there is more edema here than his skin can handle. He states he is using the pumps twice a day. We have an Unna boot on the right and 4 layer compression on the left. 3/3; he continues to have an area on the right lateral calf and right posterior calf just below the popliteal fossa. There is a fair amount of tenderness around the wound on the popliteal fossa but I did not see any evidence of cellulitis, could just be that the wrap came down and rubbed in this area. ooHe does not have an open area on the left leg however there is an area on the left dorsal foot at the base of the third toe ooWe have been using silver alginate to all wound areas 3/10; he did not have an open area on his left leg last time he was here a week ago. T oday he arrives with a horizontal wound just below the tibial tuberosity and an area on the left lateral calf. He has intense erythema and tenderness in this area. The area is on the right lateral calf and right posterior calf better than last week. We have been using silver alginate as usual 3/18 - Patient returns with 3 small open areas on left calf, and 1 small open area on right calf, the skin looks ok with no significant erythema, he continues the UNA boot on right and 4 layer compression on left. The right lateral calf wound is closed , the right posterior is small area. we will continue silver alginate to the areas. 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 He is been tolerating the dressing changes without complication. Fortunately she has made excellent improvement in  regard to the overall status of his wounds. Unfortunately every time we cease wrapping him he ends up reopening in causing more significant issues at that point. Again I'm unsure of the best direction to take although I think the lymphedema clinic may be appropriate for him. 02/03/19 on evaluation today patient appears to be doing well in regard to the wounds that we saw him for last week unfortunately he has a new area on the proximal portion of his right medial/posterior lower extremity where the wrap somewhat slowed down and caused swelling and a blister to rub and open. Unfortunately this is the only opening that he has on either leg at this point. 02/17/19 on evaluation today patient's bilateral lower extremities appear to be doing well. He still  completely healed in regard to the left lower extremity. In regard to the right lower extremity the area where the wrap and slid down and caused the blister still seems to be slightly open although this is dramatically better than during the last evaluation two weeks ago. I'm very pleased with the way this stands overall. 03/03/19 on evaluation today patient appears to be doing well in regard to his right lower extremity in general although he did have a new blister open this does not appear to be showing any evidence of active infection at this time. Fortunately there's No fevers, chills, nausea, or vomiting noted at this time. Overall I feel like he is making good progress it does feel like that the right leg will we perform the [employer] seems to do with a bit better than three layer wrap on the left which slid down on him. We may switch to doing bilateral in the book wraps. 5/4; I have not seen Mr. Shober in quite some time. According to our case manager he did not have an open wound on his left leg last week. He had 1 remaining wound on the right posterior medial calf. He arrives today with multiple openings on the left leg probably were blisters and/or  wrap injuries from Unna boots. I do not think the Unna boot's will provide adequate compression on the left. I am also not clear about the frequency he is using the compression pumps. 03/17/19 on evaluation today patient appears to be doing excellent in regard to his lower extremities compared to last week's evaluation apparently. He had gotten significantly worse last week which is unfortunate. The [employer] wrap on the left did not seem to do very well for him at all and in fact it didn't control his swelling significantly enough he had an additional outbreak. Subsequently we go back to the four layer compression wrap on the left. This is good news. At least in that he is doing better and the wound seem to be killing him. He still has not heard anything from the lymphedema clinic. 03/24/19 on evaluation today patient actually appears to be doing much better in regard to his bilateral lower Trinity as compared to last week when I saw him. Fortunately there's no signs of active infection at this time. He has been tolerating the dressing changes without complication. Overall I'm extremely pleased with the progress and appearance in general. 04/07/19 on evaluation today patient appears to be doing well in regard to his bilateral lower extremities. His swelling is significantly down from where it was previous. With that being said he does have a couple blisters still open at this point but fortunately nothing that seems to be too severe and again the majority of the larger openings has healed at this time. 04/14/19 on evaluation today patient actually appears to be doing quite well in regard to his bilateral lower extremities in fact I'm not even sure there's anything significantly open at this time at any site. Nonetheless he did have some trouble with these wraps where they are somewhat irritating him secondary to the fact that he has noted that the graph wasn't too close down to the end of this foot in a  little bit short as well up to his knee. Otherwise things seem to be doing quite well. 04/21/19 upon evaluation today patient's wound bed actually showed evidence of being completely healed in regard to both lower extremities which is excellent news. There does not appear to  be any signs of active infection which is also good news. I'm very pleased in this regard. No fevers, chills, nausea, or vomiting noted at this time. 04/28/19 on evaluation today patient appears to be doing a little bit worse in regard to both lower extremities on the left mainly due to the fact that when he went infection disease the wrap was not wrapped quite high enough he developed a blister above this. On the right he is a small open area of nothing too significant but again this is continuing to give him some trouble he has been were in the Velcro compression that he has at home. 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 He's been tolerating the dressing changes without complication. Fortunately there's no signs of active infection at this time. No fevers, chills, nausea, or vomiting noted at this time. We have been trying to get an appointment with her lymphedema clinic in [facility] but unfortunately nobody can get them on phone with not been able to even fax information over the patient likewise is not been able to get in touch with them. Overall I'm not sure exactly what's going on here with to reach out again today. 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 Still having a lot of drainage unfortunately. He tells me especially in the left but even on the right. There's no signs of active infection which is good news we've been using so ratcheted up to this point. 05/19/19 on evaluation today patient actually appears to be doing quite well with regard to his left  lower extremity which is great news. Fortunately in regard to the right lower extremity has an issues with his wrap and he subsequently did remove this from what I'm understanding. Nonetheless long story short is what he had rewrapped once he removed it subsequently had maggots underneath this wrap whenever he came in for evaluation today. With that being said they were obviously completely cleaned away by the nursing staff. The visit today which is excellent news. However he does appear to potentially have some infection around the right ankle region where the maggots were located as well. He will likely require anabiotic therapy today. 05/26/19 on evaluation today patient actually appears to be doing much better in regard to his bilateral lower extremities. I feel like the infection is under much better control. With that being said there were maggots noted when the wrap was removed yet again today. Again this could have potentially been left over from previous although at this time there does not appear to be any signs of significant drainage there was obviously on the wrap some drainage as well this contracted gnats or otherwise. Either way I do not see anything that appears to be doing worse in my pinion and in fact I think his drainage has slowed down quite significantly likely mainly due to the fact to his infection being under better control. 06/02/2019 on evaluation today patient actually appears to be doing well with regard to his bilateral lower extremities there is no signs of active infection at this time which is great news. With that being said he does have several open areas more so on the right than the left but nonetheless these are all significantly better than previously noted. 06/09/2019 on evaluation today patient actually appears to be doing well. His wrap stayed up and  he did not cause any problems he had more drainage on the right compared to the left but overall I do not see any  major issues at this time which is great news. 06/16/2019 on evaluation today patient appears to be doing excellent with regard to his lower extremities the only area that is open is a new blister that can have opened as of today on the medial ankle on the left. Other than this he really seems to be doing great I see no major issues at this point. 06/23/2019 on evaluation today patient appears to be doing quite well with regard to his bilateral lower extremities. In fact he actually appears to be almost completely healed there is a small area of weeping noted of the right lower extremity just above the ankle. Nonetheless fortunately there is no signs of active infection at this time which is good news. No fevers, chills, nausea, vomiting, or diarrhea. 8/24; the patient arrived for a nurse visit today but complained of very significant pain in the left leg and therefore I was asked to look at this. Noted that he did not have an open area on the left leg last week nevertheless this was wrapped. The patient states that he is not been able to put his compression pumps on the left leg because of the discomfort. He has not been systemically unwell 06/30/2019 on evaluation today patient unfortunately despite being excellent last week is doing much worse with regard to his left lower extremity today. In fact he had to come in for a nurse on Monday where his left leg had to be rewrapped due to excessive weeping Dr. Leanord Hawking placed him on doxycycline at that point. Fortunately there is no signs of active infection Systemically at this time which is good news. 07/07/2019 in regard to the patient's wounds today he actually seems to be doing well with his right lower extremity there really is nothing open or draining at this point this is great news. Unfortunately the left lower extremity is given him additional trouble at this time. There does not appear to be any signs of active infection nonetheless he does have a lot  of edema and swelling noted at this point as well as blistering all of which has led to a much more poor appearing leg at this time compared to where it was 2 weeks ago when it was almost completely healed. Obviously this is a little discouraging for the patient. He is try to contact the lymphedema clinic in [place] he has not been able to get through to them. 07/14/2019 on evaluation today patient actually appears to be doing slightly better with regard to his left lower extremity ulcers. Overall I do feel like at least at the top of the wrap that we have been placing this area has healed quite nicely and looks much better. The remainder of the leg is showing signs of improvement. Unfortunately in the thigh area he still has an open region on the left and again on the right he has been utilizing just a Band-Aid on an area that also opened on the thigh. Again this is an area that were not able to wrap although we did do an Ace wrap to provide some compression that something that obviously is a little less effective than the compression wraps we have been using on the lower portion of the leg. He does have an appointment with the lymphedema clinic in [facility] on Friday. 07/21/2019 on evaluation today patient appears  to be doing better with regard to his lower extremity ulcers. He has been tolerating the dressing changes without complication. Fortunately there is no signs of active infection at this time. No fevers, chills, nausea, vomiting, or diarrhea. I did receive the paperwork from the physical therapist at the lymphedema clinic in [place]. Subsequently I signed off on that this morning and sent that back to him for further progression with the treatment plan. 07/28/2019 on evaluation today patient appears to be doing very well with regard to his right lower extremity where I do not see any open wounds at this point. Fortunately he is feeling great as far as that is concerned as well. In  regard to the left lower extremity he has been having issues with still several areas of weeping and edema although the upper leg is doing better his lower leg still I think is going require the compression wrap at this time. No fevers, chills, nausea, vomiting, or diarrhea. 08/04/2019 on evaluation today patient unfortunately is having new wounds on the right lower extremity. Again we have been using Unna boot wrap on that side. We switched him to using his juxta lite wrap at home. With that being said he tells me he has been using it although his legs extremely swollen and to be honest really does not appear that he has been. I cannot know that for sure however. Nonetheless he has multiple new wounds on the right lower extremity at this time. Obviously we will have to see about getting this rewrapped for him today. 08/11/2019 on evaluation today patient appears to be doing fairly well with regard to his wounds. He has been tolerating the dressing changes including the compression wraps without complication. He still has a lot of edema in his upper thigh regions bilaterally he is supposed to be seeing the lymphedema clinic on the 15th of this month once his wraps arrive for the upper part of his legs. 08/18/2019 on evaluation today patient appears to be doing well with regard to his bilateral lower extremities at this point. He has been tolerating the dressing changes without complication. Fortunately there is no signs of active infection which is also good news. He does have a couple weeping areas on the first and second toe of the right foot he also has just a small area on the left foot upper leg and a small area on the left lower leg but overall he is doing quite well in my opinion. He is supposed to be getting his wraps shortly in fact tomorrow and then subsequently is seeing the lymphedema clinic next Wednesday on the 21st. Of note he is also leaving on the 25th to [place]. For that reason and since there is some uncertainty about what there can be doing at lymphedema clinic next Wednesday I am get a make an appointment for next Friday here for [place] to see what we need to do for him prior to him leaving for vacation. 10/23; patient arrives in considerable pain predominantly in the upper posterior calf just distal to the popliteal fossa also in the wound anteriorly above the major wound. This is probably cellulitis and he has had this recurrently in the past. He has no open wound on the right side and he has had an Radio broadcast assistant in that area. Finally I note that he has an area on the left posterior calf which by enlarge is mostly epithelialized. This protrudes beyond the  borders of the surrounding skin in the setting of dry scaly skin and lymphedema. The patient is leaving for [facility] on Sunday. Per his longstanding pattern, he will not take his compression pumps with him predominantly out of fear that they will be stolen. He therefore asked that we put a Unna boot back on the right leg. He will also contact the wound care center in [facility] to see if they can change his dressing in the mid week. 11/3; patient returned from his vacation to [facility]. He was seen on 1 occasion at their wound care center. They did a 2 layer compression system as they did not have our 4-layer wrap. I am not completely certain what they put on the wounds. They did not change the Unna boot on the right. The patient is also seeing a lymphedema specialist physical therapist in [place]. It appears that he has some compression sleeve for his thighs which indeed look quite a bit better than I am used to seeing. He pumps over these with his external compression pumps. 11/10; the patient has a new wound on the right medial thigh otherwise there is no open areas on the right. He has an area on the left leg posteriorly anteriorly and medially and an area over the left second toe. We  have been using silver alginate. He thinks the injury on his thigh is secondary to friction from the compression sleeve he has. 11/17; the patient has a new wound on the right medial thigh last week. He thinks this is because he did not have a underlying stocking for his thigh juxta lite apparatus. He now has this. The area is fairly large and somewhat angry but I do not think he has underlying cellulitis. ooHe has a intact blister on the right anterior tibial area. ooSmall wound on the right great toe dorsally ooSmall area on the medial left calf. 11/30; the patient does not have any open areas on his right leg and we did not take his juxta lite stocking off. However he states that on Friday his compression wrap fell down lodging around his upper mid calf area. As usual this creates a lot of problems for him. He called urgently today to be seen for a nurse visit however the nurse visit turned into a provider visit because of extreme erythema and pain in the left anterior tibia extending laterally and posteriorly. The area that is problematic is extensive 10/06/2019 upon evaluation today patient actually appears to be doing poorly in regard to his left lower extremity. He Dr. Leanord Hawking did place him on doxycycline this past Monday apparently due to the fact that he was doing much worse in regard to this left leg. Fortunately the doxycycline does seem to be helping. Unfortunately we are still having a very difficult time getting his edema under any type of control in order to anticipate discharge at some point. The only way were really able to control his lymphedema really is with compression wraps and that has only even seemingly temporary. He has been seeing a lymphedema clinic they are trying to help in this regard but still this has been somewhat frustrating in general for the patient. 10/13/19 on evaluation today patient appears to be doing excellent with regard to his right lower extremity as far  as the wounds are concerned. His swelling is still quite extensive unfortunately. He is still having a lot of drainage from the thigh areas bilaterally which is unfortunate. He's been going to  lymphedema clinic but again he still really does not have this edema under control as far as his lower extremities are concern. With regard to his left lower extremity this seems to be improving and I do believe the doxycycline has been of benefit for him. He is about to complete the doxycycline. 10/20/2019 on evaluation today patient appears to be doing poorly in regard to his bilateral lower extremities. More in the right thigh he has a lot of irritation at this site unfortunately. In regard to the left lower extremity the wrap was not quite as high it appears and does seem to have caused him some trouble as well. Fortunately there is no evidence of systemic infection though he does have some blue-green drainage which has me concerned for the possibility of Pseudomonas. He tells me he is previously taking Cipro without complications and he really does not care for Levaquin however due to some of the side effects he has. He is not allergic to any medications specifically antibiotics that were aware of. 10/27/2019 on evaluation today patient actually does appear to be for the most part doing better when compared to last week's evaluation. With that being said he still has multiple open wounds over the bilateral lower extremities. He actually forgot to start taking the Cipro and states that he still has the whole bottle. He does have several new blisters on left lower extremity today I think I would recommend he go ahead and take the Cipro based on what I am seeing at this point. 12/30-Patient comes at 1 week visit, 4 layer compression wraps on the left and Unna boot on the right, primary dressing Xtrasorb and silver alginate. Patient is taking his Cipro and has a few more days left probably 5-6, and the legs are  doing better. He states he is using his compressions devices which I believe he has 11/10/2019 on evaluation today patient actually appears to be much better than last time I saw him 2 weeks ago. His wounds are significantly improved and overall I am very pleased in this regard. Fortunately there is no signs of active infection at this time. He is just a couple of days away from completing Cipro. Overall his edema is much better he has been using his lymphedema pumps which I think is also helping at this point. 11/17/2019 on evaluation today patient appears to be doing excellent in regard to his wounds in general. His legs are swollen but not nearly as much as they have been in the past. Fortunately he is tolerating the compression wraps without complication. No fevers, chills, nausea, vomiting, or diarrhea. He does have some erythema however in the distal portion of his right lower extremity specifically around the forefoot and toes there is a little bit of warmth here as well. 11/24/2019 on evaluation today patient appears to be doing well with regard to his right lower extremity I really do not see any open wounds at this point. His left lower extremity does have several open areas and his right medial thigh also is open. Other than this however overall the patient seems to be making good progress and I am very pleased at this point. 12/01/2019 on evaluation today patient appears to be doing poorly at this point in regard to his left lower extremity has several new blisters despite the fact that we have him in compression wraps. In fact he had a 4-layer compression wrap, his upper thigh wrapped from lymphedema clinic, and a juxta light over  top of the 4 layer compression wrap the lymphedema clinic applied and despite all this he still develop blisters underneath. Obviously this does have me concerned about the fact that unfortunately despite what we are doing to try to get wounds healed he continues to  have new areas arise I do not think he is ever good to be at the point where he can realistically just use wraps at home to keep things under control. Typically when we heal him it takes about 1-2 days before he is back in the clinic with severe breakdown and blistering of his lower extremities bilaterally. This is happened numerous times in the past. Unfortunately I think that we may need some help as far as overall fluid overload to kind of limit what we are seeing and get things under better control. 12/08/2019 on evaluation today patient presents for follow-up concerning his ongoing bilateral lower extremity edema. Unfortunately he is still having quite a bit of swelling the compression wraps are controlling this to some degree but he did see Dr. [name] his cardiologist I do have that available for review today as far as the appointment was concerned that was on 12/06/2019. Obviously that she has been 2 days ago. The patient states that he is only been taking the Lasix 80 mg 1 time a day he had told me previously he was taking this twice a day. Nonetheless Dr. [name] recommended this be up to 80 mg 2 times a day for the patient as he did appear to be fluid overloaded. With that being said the patient states he did this yesterday and he was unable to go anywhere or do anything due to the fact that he was constantly having to urinate. Nonetheless I think that this is still good to be something that is important for him as far as trying to get his edema under control at all things that he is going to be able to just expect his wounds to get under control and things to be better without going through at least a period of time where he is trying to stabilize his fluid management in general and I think increasing the Lasix is likely the first step here. It was also mentioned the possibility that the patient may require metolazone. With that being said he wanted to have the patient take Lasix twice a day first  and then reevaluating 2 months to see where things stand. 12/15/2019 upon evaluation today patient appears to be doing regard to his legs although his toes are showing some signs of weeping especially on the left at this point to some degree on the right. There does not appear to be any signs of active infection and overall I do feel like the compression wraps are doing well for him but he has not been able to take the Lasix at home and the increased dose that Dr. [name] recommended. He tells me that just not go to be feasible for him. Nonetheless I think in this case he should probably send a message to Dr. [name] in order to discuss options from the standpoint of possible admission to get the fluid off or otherwise going forward. 12/22/2019 upon evaluation today patient appears to be doing fairly well with regard to his lower extremities at this point. In fact he would be doing excellent if it was not for the fact that his right anterior thigh apparently had an allergic reaction to adhesive tape that he used. The wound itself that we have  been monitoring actually appears to be healed. There is a lot of irritation at this point. 12/29/2019 upon evaluation today patient appears to be doing well in regard to his lower extremities. His left medial thigh is open and somewhat draining today but this is the only region that is open the right has done much better with the treatment utilizing the steroid cream that I prescribed for him last week. Overall I am pleased in that regard. Fortunately there is no signs of active infection at this time. No fevers, chills, nausea, vomiting, or diarrhea. 01/05/2020 upon evaluation today patient appears to be doing more poorly in regard to his right lower extremity at this point upon evaluation today. Unfortunately he continues to have issues in this regard and I think the biggest issue is controlling his edema. This obviously is not very well controlled at this point is  been recommended that he use the Lasix twice a day but he has not been able to do that. Unfortunately I think this is leading to an issue where honestly he is not really able to effectively control his edema and therefore the wounds really are not doing significantly better. I do not think that he is going to be able to keep things under good control unless he is able to control his edema much better. I discussed this again in great detail with him today. 01/12/2020 good news is patient actually appears to be doing quite well today at this point. He does have an appointment with lymphedema clinic tomorrow. His legs appear healed and the toe on the left is almost completely healed. In general I am very pleased with how things stand at this point. 01/19/2020 upon evaluation today patient appears to actually be doing well in regard to his lower extremities there is nothing open at this point. Fortunately he has done extremely well more recently. Has been seeing lymphedema clinic as well. With that being said he has Velcro wraps for his lower legs as well as his upper legs. The only wound really is on his toe which is the right great toe and this is barely anything even there. With all that being said I think it is good to be appropriate today to go ahead and switch him over to the Velcro compression wraps. 01/26/2020 upon evaluation today patient appears to be doing worse with regard to his lower extremities after last week switch him to Velcro compression wraps. Unfortunately he lasted less than 24 hours he did not have the sock portion of his Velcro wrap on the left leg and subsequently developed a blister underneath the Velcro portion. Obviously this is not good and not what we were looking for at this point. He states the lymphedema clinic did tell him to wear the wrap for 23 hours and take him off for 1 I am okay with that plan but again right now we got a get things back under control again he may have  some cellulitis noted as well. 02/02/2020 upon evaluation today patient unfortunately appears to have several areas of blistering on his bilateral lower extremities today mainly on the feet. His legs do seem to be doing somewhat better which is good news. Fortunately there is no evidence of active infection at this time. No fevers, chills, nausea, vomiting, or diarrhea. 02/16/2020 upon evaluation today patient appears to be doing well at this time with regard to his legs. He has a couple weeping areas on his toes but for the most part  everything is doing better and does appear to be sealed up on his legs which is excellent news. We can continue with wrapping him at this point as he had every time we discontinue the wraps he just breaks out with new wounds. There is really no point in is going forward with this at this point. 03/08/2020 upon evaluation today patient actually appears to be doing quite well with regard to his lower extremity ulcers. He has just a very superficial and really almost nonexistent blister on the left lower extremity he has in general done very well with the compression wraps. With that being said I do not see any signs of infection at this time which is good news. 03/29/2020 upon evaluation today patient appears to be doing well with regard to his wounds currently except for where he had several new areas that opened up due to some of the wrap slipping and causing him trouble. He states he did not realize they had slipped. Nonetheless he has a 1 area on the right and 3 new areas on the left. Fortunately there is no signs of active infection at this time which is great news. 04/05/2020 upon evaluation today patient actually appears to be doing quite well in general in regard to his legs currently. Fortunately there is no signs of active infection at this time. No fevers, chills, nausea, vomiting, or diarrhea. He tells me next week that he will actually be seen in the lymphedema  clinic on Thursday at 10 AM I see him on Wednesday next week. 04/12/2020 upon evaluation today patient appears to be doing very well with regard to his lower extremities bilaterally. In fact he does not appear to have any open wounds at this point which is good news. Fortunately there is no signs of active infection at this time. No fevers, chills, nausea, vomiting, or diarrhea. 04/19/2020 upon evaluation today patient appears to be doing well with regard to his wounds currently on the bilateral lower extremities. There does not appear to be any signs of active infection at this time. Fortunately there is no evidence of systemic infection and overall very pleased at this point. Nonetheless after I held him out last week he literally had blisters the next morning already which swelled up with him being right back here in the clinic. Overall I think that he is just not can be able to be discharged with his legs the way they are he is much to volume overloaded as far as fluid is concerned and that was discussed with him today of also discussed this but should try the clinic nurse manager as well as Dr. Leanord Hawking. 04/26/2020 upon evaluation today patient appears to be doing better with regard to his wounds currently. He is making some progress and overall swelling is under good control with the compression wraps. Fortunately there is no evidence of active infection at this time. 05/10/2020 on evaluation today patient appears to be doing overall well in regard to his lower extremities bilaterally. He is Tolerating the compression wraps without complication and with what we are seeing currently I feel like that he is making excellent progress. There is no signs of active infection at this time. 05/24/2020 upon evaluation today patient appears to be doing well in regard to his legs. The swelling is actually quite a bit down compared to where it has been in the past. Fortunately there is no sign of active infection at  this time which is also good news. With that  being said he does have several wounds on his toes that have opened up at this point. 05/31/2020 upon evaluation today patient appears to be doing well with regard to his legs bilaterally where he really has no significant fluid buildup at this point overall he seems to be doing quite well. Very pleased in this regard. With regard to his toes these also seem to be drying up which is excellent. We have continue to wrap him as every time we tried as a transition to the juxta light wraps things just do not seem to get any better. 06/07/2020 upon evaluation today patient appears to be doing well with regard to his right leg at this point. Unfortunately left leg has a lot of blistering he tells me the wrap started to slide down on him when he tried to put his other Velcro wrap over top of it to help keep things in order but nonetheless still had some issues. 06/14/2020 on evaluation today patient appears to be doing well with regard to his lower extremity ulcers and foot ulcers at this point. I feel like everything is actually showing signs of improvement which is great news overall there is no signs of active infection at this time. No fevers, chills, nausea, vomiting, or diarrhea. 06/21/2020 on evaluation today patient actually appears to be doing okay in regard to his wounds in general. With that being said the biggest issue I see is on his right foot in particular the first and second toe seem to be doing a little worse due to the fact this is staying very wet. I think he is probably getting need to change out his dressings a couple times in between each week when we see him in regard to his toes in order to keep this drier based on the location and how this is proceeding. 06/28/2020 on evaluation today patient appears to be doing a little bit more poorly overall in regard to the appearance of the skin I am actually somewhat concerned about the possibility of him  having a little bit of an infection here. We discussed the course of potentially giving him a doxycycline prescription which he is taken previously with good result. With that being said I do believe that this is potentially mild and at this point easily fixed. I just do not want anything to get any worse. 07/12/2020 upon evaluation today patient actually appears to be making some progress with regard to his legs which is great news there does not appear to be any evidence of active infection. Overall very pleased with where things stand. 07/26/2020 upon evaluation today patient appears to be doing well with regard to his leg ulcers and toe ulcers at this point. He has been tolerating the compression wraps without complication overall very pleased in this regard. 08/09/2020 upon evaluation today patient appears to be doing well with regard to his lower extremities bilaterally. Fortunately there is no signs of active infection overall I am pleased with where things stand. 08/23/2020 on evaluation today patient appears to be doing well with regard to his wound. He has been tolerating the dressing changes without complication. Fortunately there is no signs of active infection at this time. Overall his legs seem to be doing quite well which is great news and I am very pleased in that regard. No fevers, chills, nausea, vomiting, or diarrhea. Objective Constitutional Obese and well-hydrated in no acute distress. Vitals Time Taken: 10:20 AM, Height: 70 in, Weight: 380.2 lbs, BMI: 54.5, Temperature:  97.8 F, Pulse: 73 bpm, Respiratory Rate: 20 breaths/min, Blood Pressure: 150/72 mmHg, Capillary Blood Glucose: 154 mg/dl. General Notes: glucose per pt report Respiratory normal breathing without difficulty. Psychiatric this patient is able to make decisions and demonstrates good insight into disease process. Alert and Oriented x 3. pleasant and cooperative. General Notes: Patient's wounds currently again are  showing signs of being for the most part close there is no significant openings he does have some areas of leaking here and there but nothing too dramatic. Overall I am very pleased with where things stand at this time. The dry skin on the top of his foot could potentially be benefited by the use of 40% urea cream. Integumentary (Hair, Skin) Wound #183 status is Open. Original cause of wound was Blister. The wound is located on the Left T Second. The wound measures 0.4cm length x 0.4cm oe width x 0.1cm depth; 0.126cm^2 area and 0.013cm^3 volume. There is Fat Layer (Subcutaneous Tissue) exposed. There is no tunneling or undermining noted. There is a medium amount of serous drainage noted. The wound margin is flat and intact. There is large (67-100%) pink, pale granulation within the wound bed. There is no necrotic tissue within the wound bed. Wound #184 status is Healed - Epithelialized. Original cause of wound was Gradually Appeared. The wound is located on the Left,Anterior Lower Leg. The wound measures 0cm length x 0cm width x 0cm depth; 0cm^2 area and 0cm^3 volume. There is no tunneling or undermining noted. There is a none present amount of drainage noted. There is no granulation within the wound bed. There is no necrotic tissue within the wound bed. 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 Post procedure Diagnosis Wound #: Same as Pre-Procedure There was a Four Layer Compression Therapy Procedure by  Cherylin Mylar, RN. Post procedure Diagnosis Wound #: Same as Pre-Procedure Plan Follow-up Appointments: Return appointment in 3 weeks. - with Leonard Schwartz Nurse Visit: - 1 week Dressing Change Frequency: Wound #183 Left T Second: oe Do not change entire dressing for one week. Skin Barriers/Peri-Wound Care: Moisturizing lotion - both legs and feet and toes Other: - patient to get urea cream 40% to be used on feet and toes Wound Cleansing: May shower with protection. Primary Wound Dressing: Wound #183 Left T Second: oe Calcium Alginate with Silver - to any blisters or weeping areas Secondary Dressing: Wound #183 Left T Second: oe Kerlix/Rolled Gauze - secure with coban Dry Gauze Edema Control: 4 layer compression: Left lower extremity Unna Boot to Right Lower Extremity Avoid standing for long periods of time Elevate legs to the level of the heart or above for 30 minutes daily and/or when sitting, a frequency of: - throughout the day Exercise regularly 1. Would recommend currently that we continue with the wound care measures as before specifically with regard to the compression. We are using a 4-layer compression wrap on the left and Unna boot on the right. 2. I am also can recommend that the patient continue with any alginate to any open areas the right now he seems to be doing quite well. 3.  I did recommend he purchase a 40% urea cream we can use this on the dry skin areas try to loosen some of this up for him when he comes in for his dressing changes. We will see patient back for reevaluation in 2 weeks here in the clinic. If anything worsens or changes patient will contact our office for additional recommendations. Electronic Signature(s) Signed: 08/23/2020 11:33:44 AM By: Lenda Kelp PA-C Entered By: Lenda Kelp on 08/23/2020 11:33:44 -------------------------------------------------------------------------------- SuperBill Details Patient Name: Date of Service: CO  [name], Khali J. 08/23/2020 Medical Record Number: [id number] Patient Account Number: [id number] Date of Birth/Sex: Treating RN: 09/17/1951 (69 y.o. [name] Primary Care Provider: Nicoletta Ba Other Clinician: Referring Provider: Treating Provider/Extender: Adele Dan in Treatment: 239 Diagnosis Coding ICD-10 Codes Code Description [phone] Non-pressure chronic ulcer of right calf limited to breakdown of skin L97.221 Non-pressure chronic ulcer of left calf limited to breakdown of skin 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 of bilateral lower extremity I89.0 Lymphedema, not elsewhere classified E11.622 Type 2 diabetes mellitus with other skin ulcer 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, unspecified L03.116 Cellulitis of left lower limb Facility Procedures CPT4 Code: 16073710 Description: (Facility Use Only) [phone] - APPLY UNNA BOOT RT Modifier: Quantity: 1 CPT4 Code: 46270350 Description: (Facility Use Only) 29581LT - APPLY MULTLAY COMPRS LWR LT LEG Modifier: 59 Quantity: 1 Physician Procedures : CPT4 Code Description Modifier [id number] - WC PHYS LEVEL 3 - EST PT ICD-10 Diagnosis Description 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 skin L97.221 Non-pressure chronic ulcer of left calf limited to breakdown of  skin 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 of bilateral lower extremity I89.0 Lymphedema, not elsewhere classified Quantity: 1 Electronic Signature(s) Signed: 08/23/2020 11:34:06 AM By: Lenda Kelp PA-C Entered By: Lenda Kelp on 08/23/2020 11:34:03
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
## 2020-08-30 ENCOUNTER — Encounter (HOSPITAL_BASED_OUTPATIENT_CLINIC_OR_DEPARTMENT_OTHER): Payer: Medicare Other | Admitting: Physician Assistant

## 2020-08-30 ENCOUNTER — Other Ambulatory Visit: Payer: Self-pay

## 2020-08-30 DIAGNOSIS — I890 Lymphedema, not elsewhere classified: Secondary | ICD-10-CM | POA: Diagnosis not present

## 2020-08-30 DIAGNOSIS — E11622 Type 2 diabetes mellitus with other skin ulcer: Secondary | ICD-10-CM | POA: Diagnosis not present

## 2020-08-30 DIAGNOSIS — L97222 Non-pressure chronic ulcer of left calf with fat layer exposed: Secondary | ICD-10-CM | POA: Diagnosis not present

## 2020-08-30 DIAGNOSIS — E1140 Type 2 diabetes mellitus with diabetic neuropathy, unspecified: Secondary | ICD-10-CM | POA: Diagnosis not present

## 2020-08-30 DIAGNOSIS — L03116 Cellulitis of left lower limb: Secondary | ICD-10-CM | POA: Diagnosis not present

## 2020-08-30 DIAGNOSIS — I87333 Chronic venous hypertension (idiopathic) with ulcer and inflammation of bilateral lower extremity: Secondary | ICD-10-CM | POA: Diagnosis not present

## 2020-08-30 DIAGNOSIS — L97211 Non-pressure chronic ulcer of right calf limited to breakdown of skin: Secondary | ICD-10-CM | POA: Diagnosis not present

## 2020-08-30 NOTE — Progress Notes (Addendum)
RITIK, STAVOLA ([id number]) Visit Report for 08/30/2020 Chief Complaint Document Details Patient Name: Date of Service: [name], [name] 08/30/2020 10:30 A M Medical Record Number: [id number] Patient Account Number: [id number] Date of Birth/Sex: Treating RN: [birth year] (69 y.o. [name] Primary Care Provider: Nicoletta Ba Other Clinician: Referring Provider: Treating Provider/Extender: Adele Dan in Treatment: 240 Information Obtained from: Patient Chief Complaint patient is here for evaluation venous/lymphedema weeping Electronic Signature(s) Signed: 08/30/2020 10:54:52 AM By: Lenda Kelp PA-C Entered By: Lenda Kelp on 08/30/2020 10:54:52 -------------------------------------------------------------------------------- Problem List Details Patient Name: Date of Service: [name], [name] 08/30/2020 10:30 A M Medical Record Number: [id number] Patient Account Number: [id number] Date of Birth/Sex: Treating RN: 1951-03-08 (69 y.o. 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 01/22/2016 No Yes bilateral lower extremity I89.0 Lymphedema, not elsewhere classified 01/22/2016 No Yes E11.622 Type 2 diabetes mellitus with other skin ulcer 01/22/2016 No Yes 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, unspecified 01/22/2016 No Yes L03.116 Cellulitis of left lower limb 04/01/2017 No Yes Inactive Problems ICD-10 Code Description Active Date Inactive  Date 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 skin 06/30/2017 06/30/2017 L97.521 Non-pressure chronic ulcer of other 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 of skin 04/27/2018 04/27/2018 L03.115 Cellulitis of right lower limb 12/22/2017 12/22/2017 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 06/30/2018 06/30/2018 L97.511 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 of skin 06/30/2018 06/30/2018 Resolved Problems Electronic Signature(s) Signed: 08/30/2020 10:54:33 AM By: Lenda Kelp PA-C Entered By: Lenda Kelp on 08/30/2020 10:54:32 -------------------------------------------------------------------------------- [place] Details Patient Name: Date of Service: [name], [name] 08/30/2020 Medical Record Number: [id number] Patient Account Number: [id number] Date of Birth/Sex: Treating RN: 1951/08/18 (69 y.o. 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 of bilateral lower extremity I89.0 Lymphedema, not elsewhere classified E11.622 Type 2 diabetes mellitus with other skin ulcer 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, unspecified L03.116 Cellulitis of left lower limb Facility Procedures CPT4 Code: [id number] Description: (Facility Use Only) [phone] - APPLY MULTLAY COMPRS LWR LT LEG Modifier: Quantity: 1 CPT4 Code: [id number] Description: (Facility Use Only) [id number] - Joetta Manners BOOT RT Modifier: 59 Quantity: 1 Electronic Signature(s) Signed: 08/30/2020 4:47:33 PM By:  Lenda Kelp PA-C Signed: 08/31/2020 5:24:43 PM By: Shawn Stall Entered By: Shawn Stall on 08/30/2020 11:59:37
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
## 2020-09-06 ENCOUNTER — Encounter (HOSPITAL_BASED_OUTPATIENT_CLINIC_OR_DEPARTMENT_OTHER): Payer: Medicare Other | Admitting: Physician Assistant

## 2020-09-13 ENCOUNTER — Other Ambulatory Visit: Payer: Self-pay | Admitting: Family Medicine

## 2020-09-13 ENCOUNTER — Encounter (HOSPITAL_BASED_OUTPATIENT_CLINIC_OR_DEPARTMENT_OTHER): Payer: Medicare Other | Attending: Physician Assistant | Admitting: Physician Assistant

## 2020-09-13 ENCOUNTER — Ambulatory Visit (INDEPENDENT_AMBULATORY_CARE_PROVIDER_SITE_OTHER): Payer: Medicare Other

## 2020-09-13 ENCOUNTER — Other Ambulatory Visit: Payer: Self-pay

## 2020-09-13 DIAGNOSIS — L97211 Non-pressure chronic ulcer of right calf limited to breakdown of skin: Secondary | ICD-10-CM | POA: Insufficient documentation

## 2020-09-13 DIAGNOSIS — L03116 Cellulitis of left lower limb: Secondary | ICD-10-CM | POA: Insufficient documentation

## 2020-09-13 DIAGNOSIS — L03119 Cellulitis of unspecified part of limb: Secondary | ICD-10-CM

## 2020-09-13 DIAGNOSIS — I890 Lymphedema, not elsewhere classified: Secondary | ICD-10-CM | POA: Insufficient documentation

## 2020-09-13 DIAGNOSIS — E1140 Type 2 diabetes mellitus with diabetic neuropathy, unspecified: Secondary | ICD-10-CM | POA: Insufficient documentation

## 2020-09-13 DIAGNOSIS — E11622 Type 2 diabetes mellitus with other skin ulcer: Secondary | ICD-10-CM | POA: Diagnosis not present

## 2020-09-13 DIAGNOSIS — L97221 Non-pressure chronic ulcer of left calf limited to breakdown of skin: Secondary | ICD-10-CM | POA: Insufficient documentation

## 2020-09-13 DIAGNOSIS — I872 Venous insufficiency (chronic) (peripheral): Secondary | ICD-10-CM

## 2020-09-13 DIAGNOSIS — I87333 Chronic venous hypertension (idiopathic) with ulcer and inflammation of bilateral lower extremity: Secondary | ICD-10-CM | POA: Insufficient documentation

## 2020-09-13 NOTE — Progress Notes (Signed)
[name] Cowperis a 68 y.o.malepresents to the office today for bicillininjections, per physician's orders.  Bicillin 600,000 Unitswas administeredbilaterally in Right and Left upper outter quadtoday. Patient tolerated injection.  Patient due for follow up labs/provider appt:Yes. Date due:10/11/20, appt madeYes Next injection due:10/11/20, appt made:yes

## 2020-09-13 NOTE — Progress Notes (Signed)
[name], [name] ([id number]) Visit Report for 09/13/2020 Arrival Information Details Patient Name: Date of Service: [name], VANPATTEN 09/13/2020 10:30 A M Medical Record Number: [id number] Patient Account Number: [id number] Date of Birth/Sex: Treating RN: 29-Apr-1951 (69 y.o. [name] Primary Care Bergen Melle: [name] Other Clinician: Referring Adeja Sarratt: Treating Dorianna Mckiver/Extender: [name] in Treatment: 77 Visit Information History Since Last Visit Added or deleted any medications: No Patient Arrived: Walker Any new allergies or adverse reactions: No Arrival Time: 11:05 Had a fall or experienced change in No Accompanied By: alone activities of daily living that may affect Transfer Assistance: None risk of falls: Patient Identification Verified: Yes Signs or symptoms of abuse/neglect since No Secondary Verification Process Completed: Yes last visito Patient Requires Transmission-Based Precautions: No Hospitalized since last visit: No Patient Has Alerts: Yes Implantable device outside of the clinic No excluding cellular tissue based products placed in the center since last visit: Has Dressing in Place as Prescribed: Yes Has Compression in Place as Prescribed: Yes Has Footwear/Offloading in Place as Yes Prescribed: Left: Surgical Shoe with Pressure Relief Insole Right: Surgical Shoe with Pressure Relief Insole Pain Present Now: No Electronic Signature(s) Signed: 09/13/2020 5:59:08 PM By: Deon Pilling Entered By: Deon Pilling on 09/13/2020 11:06:24 -------------------------------------------------------------------------------- Compression Therapy Details Patient Name: Date of Service: [name], [name] 09/13/2020 10:30 A M Medical Record Number: [id number] Patient Account Number: [id number] Date of Birth/Sex: Treating RN: 1950-12-17 (69 y.o. [name] Primary Care Tearia Gibbs: [name] Other Clinician: Referring  Ebrahim Deremer: Treating Noah Pelaez/Extender: [name] in Treatment: 242 Compression Therapy Performed for Wound Assessment: NonWound Condition Lymphedema - Right Leg Performed By: Clinician [name], RN Compression Type: [name] Post Procedure Diagnosis Same as Pre-procedure Electronic Signature(s) Signed: 09/13/2020 5:57:36 PM By: Baruch Gouty RN, BSN Entered By: Baruch Gouty on 09/13/2020 11:36:37 -------------------------------------------------------------------------------- Compression Therapy Details Patient Name: Date of Service: [name], [name] 09/13/2020 10:30 A M Medical Record Number: [id number] Patient Account Number: [id number] Date of Birth/Sex: Treating RN: 02-03-1951 (69 y.o. [name] Primary Care Thomasina Housley: [name] Other Clinician: Referring Lilyahna Sirmon: Treating Makel Mcmann/Extender: [name] in Treatment: 242 Compression Therapy Performed for Wound Assessment: NonWound Condition Lymphedema - Left Leg Performed By: Clinician [name], RN Compression Type: Four Layer Post Procedure Diagnosis Same as Pre-procedure Electronic Signature(s) Signed: 09/13/2020 5:57:36 PM By: Baruch Gouty RN, BSN Entered By: Baruch Gouty on 09/13/2020 11:37:01 -------------------------------------------------------------------------------- Encounter Discharge Information Details Patient Name: Date of Service: [name], Demontrez J. 09/13/2020 10:30 A M Medical Record Number: [id number] Patient Account Number: [id number] Date of Birth/Sex: Treating RN: Jun 13, 1951 (69 y.o. [name] Primary Care Joshawa Dubin: [name] Other Clinician: Referring Deavin Forst: Treating Hevin Jeffcoat/Extender: [name] in Treatment: [id number] Encounter Discharge Information Items Discharge Condition: Stable Ambulatory Status: Walker Discharge Destination: Home Transportation: Private  Auto Accompanied By: self Schedule Follow-up Appointment: Yes Clinical Summary of Care: Electronic Signature(s) Signed: 09/13/2020 5:59:08 PM By: Deon Pilling Entered By: Deon Pilling on 09/13/2020 12:08:22 -------------------------------------------------------------------------------- Lower Extremity Assessment Details Patient Name: Date of Service: [name], [name] 09/13/2020 10:30 A M Medical Record Number: [id number] Patient Account Number: [id number] Date of Birth/Sex: Treating RN: 15-Jun-1951 (69 y.o. [name] Primary Care Liisa Picone: [name] Other Clinician: Referring [name]: Treating [name]/Extender: [name] in Treatment: 242 Edema Assessment Assessed: [Left: No] [Right: No] Edema: [Left: Yes] [Right: Yes] Calf Left: Right: Point of Measurement: 25  cm From Medial Instep 40 cm 41 cm Ankle Left: Right: Point of Measurement: 9 cm From Medial Instep 28 cm 305 cm Electronic Signature(s) Signed: 09/13/2020 5:59:08 PM By: Deon Pilling Entered By: Deon Pilling on 09/13/2020 11:16:43 -------------------------------------------------------------------------------- [place] Details Patient Name: Date of Service: [name], [name] 09/13/2020 10:30 A M Medical Record Number: [id number] Patient Account Number: [id number] Date of Birth/Sex: Treating RN: 27-Feb-1951 (69 y.o. [name] Primary Care Jeremiah Tarpley: [name] Other Clinician: Referring [name]: Treating Helene Bernstein/Extender: [name] in Treatment: [phone] Active Inactive Venous Leg Ulcer Nursing Diagnoses: Actual venous Insuffiency (use after diagnosis is confirmed) Goals: Patient will maintain optimal edema control Date Initiated: 09/10/2016 Target Resolution Date: 09/20/2020 Goal Status: Active Verify adequate tissue perfusion prior to therapeutic compression application Date Initiated: 09/10/2016 Date  Inactivated: 11/28/2016 Goal Status: Met Interventions: Assess peripheral edema status every visit. Compression as ordered Provide education on venous insufficiency Notes: Electronic Signature(s) Signed: 09/13/2020 5:57:36 PM By: Baruch Gouty RN, BSN Entered By: Baruch Gouty on 09/13/2020 11:34:52 -------------------------------------------------------------------------------- Non-Wound Condition Assessment Details Patient Name: Date of Service: [name], [name] 09/13/2020 10:30 A M Medical Record Number: [id number] Patient Account Number: [id number] Date of Birth/Sex: Treating RN: May 23, 1951 (69 y.o. [name] Primary Care Lamine Laton: [name] Other Clinician: Referring Pradyun Ishman: Treating Jonae Renshaw/Extender: [name] in Treatment: 242 Non-Wound Condition: Condition: Lymphedema Location: Leg Side: Right Notes cobblestone appearance. large blister noted to anterior lower leg. Electronic Signature(s) Signed: 09/13/2020 5:59:08 PM By: Deon Pilling Entered By: Deon Pilling on 09/13/2020 11:17:28 -------------------------------------------------------------------------------- Non-Wound Condition Assessment Details Patient Name: Date of Service: IVEN, [name] 09/13/2020 10:30 A M Medical Record Number: [id number] Patient Account Number: [id number] Date of Birth/Sex: Treating RN: [birth year] (69 y.o. [name] Primary Care Jahnavi Muratore: [name] Other Clinician: Referring Lavante Toso: Treating Kennth Vanbenschoten/Extender: [name] in Treatment: 242 Non-Wound Condition: Condition: Lymphedema Location: Leg Side: Left Notes cobblestone appearance. Electronic Signature(s) Signed: 09/13/2020 5:59:08 PM By: Deon Pilling Entered By: Deon Pilling on 09/13/2020 11:17:49 -------------------------------------------------------------------------------- Pain Assessment Details Patient Name: Date of  Service: JAKWAN, SALLY. 09/13/2020 10:30 A M Medical Record Number: [id number] Patient Account Number: [id number] Date of Birth/Sex: Treating RN: 1951-04-25 (69 y.o. [name] Primary Care Daylan Juhnke: [name] Other Clinician: Referring Caidence Higashi: Treating Coila Wardell/Extender: [name] in Treatment: [phone] Active Problems Location of Pain Severity and Description of Pain Patient Has Paino No Site Locations Rate the pain. Rate the pain. Current Pain Level: 0 Pain Management and Medication Current Pain Management: Medication: No Cold Application: No Rest: No Massage: No Activity: No T.E.N.S.: No Heat Application: No Leg drop or elevation: No Is the Current Pain Management Adequate: Adequate How does your wound impact your activities of daily livingo Sleep: No Bathing: No Appetite: No Relationship With Others: No Bladder Continence: No Emotions: No Bowel Continence: No Work: No Toileting: No Drive: No Dressing: No Hobbies: No Electronic Signature(s) Signed: 09/13/2020 5:59:08 PM By: Deon Pilling Entered By: Deon Pilling on 09/13/2020 11:08:16 -------------------------------------------------------------------------------- Patient/Caregiver Education Details Patient Name: Date of Service: [name], [name] 11/10/2021andnbsp10:30 A M Medical Record Number: [id number] Patient Account Number: [id number] Date of Birth/Gender: Treating RN: [birth year] (69 y.o. [name] Primary Care Physician: [name] Other Clinician: Referring Physician: Treating Physician/Extender: [name] in Treatment: [phone] Education Assessment Education Provided To: Patient Education Topics Provided Venous: Methods: Explain/Verbal Responses: Reinforcements needed, State content  correctly Wound/Skin Impairment: Methods: Explain/Verbal Responses: Reinforcements needed, State content correctly Electronic  Signature(s) Signed: 09/13/2020 5:57:36 PM By: Baruch Gouty RN, BSN Signed: 09/13/2020 5:57:36 PM By: Baruch Gouty RN, BSN Entered By: Baruch Gouty on 09/13/2020 11:35:44 -------------------------------------------------------------------------------- Wound Assessment Details Patient Name: Date of Service: [name], Capri J. 09/13/2020 10:30 A M Medical Record Number: [id number] Patient Account Number: [id number] Date of Birth/Sex: Treating RN: 1951/01/31 (69 y.o. [name] Primary Care Jamaar Howes: [name] Other Clinician: Referring Charleigh Correnti: Treating Anshika Pethtel/Extender: [name] in Treatment: 242 Wound Status Wound Number: [id number] Primary Etiology: Diabetic Wound/Ulcer of the Lower Extremity Wound Location: Left T Second oe Wound Status: Healed - Epithelialized Wounding Event: Blister Date Acquired: 07/26/2020 Weeks Of Treatment: 7 Clustered Wound: No Wound Measurements Length: (cm) 0 Width: (cm) 0 Depth: (cm) 0 Area: (cm) 0 Volume: (cm) 0 % Reduction in Area: 100% % Reduction in Volume: 100% Wound Description Classification: Grade 2 Electronic Signature(s) Signed: 09/13/2020 5:59:08 PM By: Deon Pilling Entered By: Deon Pilling on 09/13/2020 11:16:54 -------------------------------------------------------------------------------- Vitals Details Patient Name: Date of Service: [name], Jhalil J. 09/13/2020 10:30 A M Medical Record Number: [id number] Patient Account Number: [id number] Date of Birth/Sex: Treating RN: 1951/01/04 (69 y.o. [name] Primary Care Jadene Stemmer: [name] Other Clinician: Referring Angelika Jerrett: Treating Dainel Arcidiacono/Extender: [name] in Treatment: 242 Vital Signs Time Taken: 11:07 Temperature (F): 98.2 Height (in): 70 Pulse (bpm): 80 Weight (lbs): 380.2 Respiratory Rate (breaths/min): 22 Body Mass Index (BMI): 54.5 Blood Pressure (mmHg): 133/72 Capillary  Blood Glucose (mg/dl): 167 Reference Range: 80 - 120 mg / dl Electronic Signature(s) Signed: 09/13/2020 5:59:08 PM By: Deon Pilling Entered By: Deon Pilling on 09/13/2020 11:08:04

## 2020-09-13 NOTE — Progress Notes (Addendum)
[name], Kevin ([id number]) Visit Report for 09/13/2020 Chief Complaint Document Details Patient Name: Date of Service: [name], [name] 09/13/2020 10:30 A M Medical Record Number: [id number] Patient Account Number: [id number] Date of Birth/Sex: Treating RN: 09/10/1951 (69 y.o. [name] Primary Care Provider: Nicoletta Ba Other Clinician: Referring Provider: Treating Provider/Extender: Adele Dan in Treatment: [id number] Information Obtained from: Patient Chief Complaint patient is here for evaluation venous/lymphedema weeping Electronic Signature(s) Signed: 09/13/2020 11:12:31 AM By: Lenda Kelp PA-C Entered By: Lenda Kelp on 09/13/2020 11:12:31 -------------------------------------------------------------------------------- HPI Details Patient Name: Date of Service: [name], [name] 09/13/2020 10:30 A M Medical Record Number: [id number] Patient Account Number: [id number] Date of Birth/Sex: Treating RN: Aug 12, 1951 (69 y.o. [name] Primary Care Provider: Nicoletta Ba Other Clinician: Referring Provider: Treating Provider/Extender: Adele Dan in Treatment: 242 History of Present Illness HPI Description: Referred by PCP for consultation. Patient has long standing history of BLE venous stasis, no prior ulcerations. At beginning of month, developed cellulitis and weeping. Received IM Rocephin followed by Keflex and resolved. Wears compression stocking, appr 6 months old. Not sure strength. No present drainage. 01/22/16 this is a patient who is a type II diabetic on insulin. 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 He tells me he religiously wears pressure stockings of uncertain strength. He was here with weeping edema about 8 months ago but did not have an open wound. Roughly a month ago he had a reopening on his bilateral legs. He is been using bandages and Neosporin. He  does not complain of pain. 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 Last BUN/creatinine I have is from 11/20/15 at 13 and 1.0 respectively 01/29/16; patient arrives today having tolerated the Profore wrap. He brought in his stockings and these are 18 mmHg stockings he bought from [employer]. The compression here is likely inadequate. He does not complain of pain or excessive drainage she has no systemic symptoms. The wound on the right looks improved as does the one on the left although one on the left is more substantial with still tissue at risk below the actual wound area on the bilateral posterior calf 02/05/16; patient arrives with poor edema control. He states that we did put a 4 layer compression on it last week. No weight appear 5 this. 02/12/16; the area on the posterior right Has healed. The left Has a substantial wound that has necrotic surface eschar that requires a debridement with a curette. 02/16/16;the patient called or a Nurse visit secondary to increased swelling. He had been in earlier in the week with his right leg healed. He was transitioned to is on pressure stocking on the right leg with the only open wound on the left, a substantial area on the left posterior calf. Note he has a history of severe lower extremity edema, he has a history of chronic atrial fibrillation but not heart failure per my notes but I'll need to research this. He is not complaining of chest pain shortness of breath or orthopnea. The intake nurse noted blisters on the previously closed right leg 02/19/16; this is the patient's regular visit day. I see him on Friday with escalating edema new wounds on the right leg and clear signs of at least right ventricular heart failure. I increased his Lasix to 40 twice a day.  He is returning currently in follow-up. States he is noticed a decrease in that the  edema 02/26/16 patient's legs have much less edema. There is nothing really open on the right leg. The left leg has improved condition of the large superficial wound on the posterior left leg 03/04/16; edema control is very much better. The patient's right leg wounds have healed. On the left leg he continues to have severe venous inflammation on the posterior aspect of the left leg. There is no tenderness and I don't think any of this is cellulitis. 03/11/16; patient's right leg is married healed and he is in his own stocking. The patient's left leg has deteriorated somewhat. There is a lot of erythema around the wound on the posterior left leg. There is also a significant rim of erythema posteriorly just above where the wrap would've ended there is a new wound in this location and a lot of tenderness. Can't rule out cellulitis in this area. 03/15/16; patient's right leg remains healed and he is in his own stocking. The patient's left leg is much better than last review. His major wound on the posterior aspect of his left Is almost fully epithelialized. He has 3 small injuries from the wraps. Really. Erythema seems a lot better on antibiotics 03/18/16; right leg remains healed and he is in his own stocking. The patient's left leg is much better. The area on the posterior aspect of the left calf is fully epithelialized. His 3 small injuries which were wrap injuries on the left are improved only one seems still open his erythema has resolved 03/25/16; patient's right leg remains healed and he is in his own stocking. There is no open area today on the left leg posterior leg is completely closed up. His wrap injuries at the superior aspect of his leg are also resolved. He looks as though he has some irritation on the dorsal ankle but this is fully epithelialized without evidence of infection. 03/28/16; we discharged this patient on Monday. Transitioned him into his own stocking. There were problems almost  immediately with uncontrolled swelling weeping edema multiple some of which have opened. He does not feel systemically unwell in particular no chest pain no shortness of breath and he does not feel 04/08/16; the edema is under better control with the Profore light wrap but he still has pitting edema. There is one large wound anteriorly 2 on the medial aspect of his left leg and 3 small areas on the superior posterior calf. Drainage is not excessive he is tolerating a Profore light well 04/15/16; put a Profore wrap on him last week. This is controlled is edema however he had a lot of pain on his left anterior foot most of his wounds are healed 04/22/16 once again the patient has denuded areas on the left anterior foot which he states are because his wrap slips up word. He saw his primary physician today is on Lasix 40 twice a day and states that he his weight is down 20 pounds over the last 3 months. 04/29/16: Much improved. left anterior foot much improved. He is now on Lasix 80 mg per day. Much improved edema control 05/06/16; I was hoping to be able to discharge him today however once again he has blisters at a low level of where the compression was placed last week mostly on his left lateral but also his left medial leg and a small area on the anterior part of the left foot. 05/09/16; apparently the patient went  home after his appointment on 7/4 later in the evening developing pain in his upper medial thigh together with subjective fever and chills although his temperature was not taken. The pain was so intense he felt he would probably have to call 911. However he then remembered that he had leftover doxycycline from a previous round of antibiotics and took these. By the next morning he felt a lot better. He called and spoke to one of our nurses and I approved doxycycline over the phone thinking that this was in relation to the wounds we had previously seen although they were definitely were not. The  patient feels a lot better old fever no chills he is still working. Blood sugars are reasonably controlled 05/13/16; patient is back in for review of his cellulitis on his anterior medial upper thigh. He is taking doxycycline this is a lot better. Culture I did of the nodular area on the dorsal aspect of his foot grew MRSA this also looks a lot better. 05/20/16; the patient is cellulitis on the medial upper thigh has resolved. All of his wound areas including the left anterior foot, areas on the medial aspect of the left calf and the lateral aspect of the calf at all resolved. He has a new blister on the left dorsal foot at the level of the fourth toe this was excised. No evidence of infection 05/27/16; patient continues to complain weeping edema. He has new blisterlike wounds on the left anterior lateral and posterior lateral calf at the top of his wrap levels. The area on his left anterior foot appears better. He is not complaining of fever, pain or pruritus in his feet. 05/30/16; the patient's blisters on his left anterior leg posterior calf all look improved. He did not increase the Lasix 100 mg as I suggested because he was going to run out of his 40 mg tablets. He is still having weeping edema of his toes 06/03/16; I renewed his Lasix at 80 mg once a day as he was about to run out when I last saw him. He is on 80 mg of Lasix now. I have asked him to cut down on the excessive amount of water he was drinking and asked him to drink according to his thirst mechanisms 06/12/2016 -- was seen 2 days ago and was supposed to wear his compression stockings at home but he is developed lymphedema and superficial blisters on the left lower extremity and hence came in for a review 06/24/16; the remaining wound is on his left anterior leg. He still has edema coming from between his toes. There is lymphedema here however his edema is generally better than when I last saw this. He has a history of atrial fibrillation  but does not have a known history of congestive heart failure nevertheless I think he probably has this at least on a diastolic basis. 07/01/16 I reviewed his echocardiogram from January 2017. This was essentially normal. He did not have LVH, EF of 55-60%. His right ventricular function was normal although he did have trivial tricuspid and pulmonic regurgitation. This is not audible on exam however. I increased his Lasix to do massive edema in his legs well above his knees I think in early July. He was also drinking an excessive amount of water at the time. 07/15/16; missed his appointment last week because of the Labor Day holiday on Monday. He could not get another appointment later in the week. Started to feel the wrap digging in superiorly so  we remove the top half and the bottom half of his wrap. He has extensive erythema and blistering superiorly in the left leg. Very tender. Very swollen. Edema in his foot with leaking edema fluid. He has not been systemically unwell 07/22/16; the area on the left leg laterally required some debridement. The medial wounds look more stable. His wrap injury wounds appear to have healed. Edema and his foot is better, weeping edema is also better. He tells me he is meeting with the supplier of the external compression pumps at work 08/05/16; the patient was on vacation last week in [facility]. His wrap is been on for an extended period of time. Also over the weekend he developed an extensive area of tender erythema across his anterior medial thigh. He took to doxycycline yesterday that he had leftover from a previous prescription. The patient complains of weeping edema coming out of his toes 08/08/16; I saw this patient on 10/2. He was tender across his anterior thigh. I put him on doxycycline. He returns today in follow-up. He does not have any open wounds on his lower leg, he still has edema weeping into his toes. 08/12/16; patient was seen back urgently today to  follow-up for his extensive left thigh cellulitis/erysipelas. He comes back with a lot less swelling and erythema pain is much better. I believe I gave him Augmentin and Cipro. His wrap was cut down as he stated a roll down his legs. He developed blistering above the level of the wrap that remained. He has 2 open blisters and 1 intact. 08/19/16; patient is been doing his primary doctor who is increased his Lasix from 40-80 once a day or 80 already has less edema. Cellulitis has remained improved in the left thigh. 2 open areas on the posterior left calf 08/26/16; he returns today having new open blisters on the anterior part of his left leg. He has his compression pumps but is not yet been shown how to use some vital representative from the supplier. 09/02/16 patient returns today with no open wounds on the left leg. Some maceration in his plantar toes 09/10/2016 -- Dr. Leanord Hawking had recently discharged him on 09/02/2016 and he has come right back with redness swelling and some open ulcers on his left lower extremity. He says this was caused by trying to apply his compression stockings and he's been unable to use this and has not been able to use his lymphedema pumps. He had some doxycycline leftover and he has started on this a few days ago. 09/16/16; there are no open wounds on his leg on the left and no evidence of cellulitis. 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 He does not complain of symptoms here. I am not clear use using his external compression pumps. 09/23/16; I have not seen this patient in 2 weeks. He canceled his appointment 10 days ago as he was going on vacation. He tells me that on Monday he noticed a large area on his posterior left leg which is been draining copiously and is reopened into a large wound. He is been using ABDs and the external part of his juxtalite, according to our nurse this was not on properly. 10/07/16;  Still a substantial area on the posterior left leg. Using silver alginate 10/14/16; in general better although there is still open area which looks healthy. Still using silver alginate. He reminds me that this happen before he left for [facility].  T oday while he was showering in the morning. He had been using his juxtalite's 10/21/16; the area on his posterior left leg is fully epithelialized. However he arrives today with a large area of tender erythema in his medial and posterior left thigh just above the knee. I have marked the area. Once again he is reluctant to consider hospitalization. I treated him with oral antibiotics in the past for a similar situation with resolution I think with doxycycline however this area it seems more extensive to me. He is not complaining of fever but does have chills and says states he is thirsty. His blood sugar today was in the 140s at home 10/25/16 the area on his posterior left leg is fully epithelialized although there is still some weeping edema. The large area of tenderness and erythema in his medial and posterior left thigh is a lot less tender although there is still a lot of swelling in this thigh. He states he feels a lot better. He is on doxycycline and Augmentin that I started last week. This will continued until Tuesday, December 26. I have ordered a duplex ultrasound of the left thigh rule out DVT whether there is an abscess something that would need to be drained I would also like to know. 11/01/16; he still has weeping edema from a not fully epithelialized area on his left posterior calf. Most of the rest of this looks a lot better. He has completed his antibiotics. His thigh is a lot better. Duplex ultrasound did not show a DVT in the thigh 11/08/16; he comes in today with more Denuded surface epithelium from the posterior aspect of his calf. There is no real evidence of cellulitis. The superior aspect of his wrap appears to have put quite an  indentation in his leg just below the knee and this may have contributed. He does not complain of pain or fever. We have been using silver alginate as the primary dressing. The area of cellulitis in the right thigh has totally resolved. He has been using his compression stockings once a week 11/15/16; the patient arrives today with more loss of epithelium from the posterior aspect of his left calf. He now has a fairly substantial wound in this area. The reason behind this deterioration isn't exactly clear although his edema is not well controlled. He states he feels he is generally more swollen systemically. He is not complaining of chest pain shortness of breath fever. T me he has an appointment with his primary physician in early February. He is on 80 mg of oral ells Lasix a day. He claims compliance with the external compression pumps. He is not having any pain in his legs similar to what he has with his recurrent cellulitis 11/22/16; the patient arrives a follow-up of his large area on his left lateral calf. This looks somewhat better today. He came in earlier in the week for a dressing change since I saw him a week ago. He is not complaining of any pain no shortness of breath no chest pain 11/28/16; the patient arrives for follow-up of his large area on the left lateral calf this does not look better. In fact it is larger weeping edema. The surface of the wound does not look too bad. We have been using silver alginate although I'm not certain that this is a dressing issue. 12/05/16; again the patient follows up for a large wound on the left lateral and left posterior calf this does not look better. There continues  to be weeping edema necrotic surface tissue. More worrisome than this once again there is erythema below the wound involving the distal Achilles and heel suggestive of cellulitis. He is on his feet working most of the day of this is not going well. We are changing his dressing twice a week to  facilitate the drainage. 12/12/16; not much change in the overall dimensions of the large area on the left posterior calf. This is very inflamed looking. I gave him an. Doxycycline last week does not really seem to have helped. He found the wrap very painful indeed it seems to of dog into his legs superiorly and perhaps around the heel. He came in early today because the drainage had soaked through his dressings. 12/19/16- patient arrives for follow-up evaluation of his left lower extremity ulcers. He states that he is using his lymphedema pumps once daily when there is "no drainage". He admits to not using his lipedema pumps while under current treatment. His blood sugars have been consistently between 150-200. 12/26/16; the patient is not using his compression pumps at home because of the wetness on his feet. I've advised him that I think it's important for him to use this daily. He finds his feet too wet, he can put a plastic bag over his legs while he is in the pumps. Otherwise I think will be in a vicious circle. We are using silver alginate to the major area on his left posterior calf 01/02/17; the patient's posterior left leg has further of all into 3 open wounds. All of them covered with a necrotic surface. He claims to be using his compression pumps once a day. His edema control is marginal. Continue with silver alginate 01/10/17; the patient's left posterior leg actually looks somewhat better. There is less edema, less erythema. Still has 3 open areas covered with a necrotic surface requiring debridement. He claims to be using his compression pumps once a day his edema control is better 01/17/17; the patient's left posterior calf look better last week when I saw him and his wrap was changed 2 days ago. He has noted increasing pain in the left heel and arrives today with much larger wounds extensive erythema extending down into the entire heel area especially tender medially. He is not  systemically unwell CBGs have been controlled no fever. Our intake nurse showed me limegreen drainage on his AVD pads. 01/24/17; his usual this patient responds nicely to antibiotics last week giving him Levaquin for presumed Pseudomonas. The whole entire posterior part of his leg is much better much less inflamed and in the case of his Achilles heel area much less tender. He has also had some epithelialization posteriorly there are still open areas here and still draining but overall considerably better 01/31/17- He has continue to tolerate the compression wraps. he states that he continues to use the lymphedema pumps daily, and can increase to twice daily on the weekends. He is voicing no complaints or concerns regarding his LLE ulcers 02/07/17-he is here for follow-up evaluation. He states that he noted some erythema to the left medial and anterior thigh, which he states is new as of yesterday. He is concerned about recurrent cellulitis. He states his blood sugars have been slightly elevated, this morning in the 180s 02/14/17; he is here for follow-up evaluation. When he was last here there was erythema superiorly from his posterior wound in his anterior thigh. He was prescribed Levaquin however a culture of the wound surface grew MRSA over the  phone I changed him to doxycycline on Monday and things seem to be a lot better. 02/24/17; patient missed his appointment on Friday therefore we changed his nurse visit into a physician visit today. Still using silver alginate on the large area of the posterior left thigh. He isn't new area on the dorsal left second toe 03/03/17; actually better today although he admits he has not used his external compression pumps in the last 2 days or so because of work responsibilities over the weekend. 03/10/17; continued improvement. External compression pumps once a day almost all of his wounds have closed on the posterior left calf. Better edema control 03/17/17; in general  improved. He still has 3 small open areas on the lateral aspect of his left leg however most of the area on the posterior part of his leg is epithelialized. He has better edema control. He has an ABD pad under his stocking on the right anterior lower leg although he did not let us look at that today. 03/24/17; patient arrives back in clinic today with no open areas however there are areas on the posterior left calf and anterior left calf that are less than 100% epithelialized. His edema is well controlled in the left lower leg. There is some pitting edema probably lymphedema in the left upper thigh. He uses compression pumps at home once per day. I tried to get him to do this twice a day although he is very reticent. 04/01/2017 -- for the last 2 days he's had significant redness, tenderness and weeping and came in for an urgent visit today. 04/07/17; patient still has 6 more days of doxycycline. He was seen by Dr. Meyer Russel last Wednesday for cellulitis involving the posterior aspect, lateral aspect of his Involving his heel. For the most part he is better there is less erythema and less weeping. He has been on his feet for 12 hours 2 over the weekend. Using his compression pumps once a day 04/14/17 arrives today with continued improvement. Only one area on the posterior left calf that is not fully epithelialized. He has intense bilateral venous inflammation associated with his chronic venous insufficiency disease and secondary lymphedema. We have been using silver alginate to the left posterior calf wound In passing he tells [place] today that the right leg but we have not seen in quite some time has an open area on it but he doesn't want [place] to look at this today states he will show this to [place] next week. 04/21/17; there is no open area on his left leg although he still reports some weeping edema. He showed [place] his right leg today which is the first time we've seen this leg in a long time. He has a large area of  open wound on the right leg anteriorly healthy granulation. Quite a bit of swelling in the right leg and some degree of venous inflammation. He told us about the right leg in passing last week but states that deterioration in the right leg really only happened over the weekend 04/28/17; there is no open area on the left leg although there is an irritated part on the posterior which is like a wrap injury. The wound on the right leg which was new from last week at least to [place] is a lot better. 05/05/17; still no open area on the left leg. Patient is using his new compression stocking which seems to be doing a good job of controlling the edema. He states he is using his compression  pumps once per day. The right leg still has an open wound although it is better in terms of surface area. Required debridement. A lot of pain in the posterior right Achilles marked tenderness. Usually this type of presentation this patient gives concern for an active cellulitis 05/12/17; patient arrives today with his major wound from last week on the right lateral leg somewhat better. Still requiring debridement. He was using his compression stocking on the left leg however that is reopened with superficial wounds anteriorly he did not have an open wound on this leg previously. He is still using his juxta light's once daily at night. He cannot find the time to do this in the morning as he has to be at work by 7 AM 05/19/17; right lateral leg wound looks improved. No debridement required. The concerning area is on the left posterior leg which appears to almost have a subcutaneous hemorrhagic component to it. We've been using silver alginate to all the wounds 05/26/17; the right lateral leg wound continues to look improved. However the area on the left posterior calf is a tightly adherent surface. [place] using silver alginate. Because of the weeping edema in his legs there is very little good alternatives. 06/02/17; the patient left  here last week looking quite good. Major wound on the left posterior calf and a small one on the right lateral calf. Both of these look satisfactory. He tells me that by Wednesday he had noted increased pain in the left leg and drainage. He called on Thursday and Friday to get an appointment here but we were blocked. He did not go to urgent care or his primary physician. He thinks he had a fever on Thursday but did not actually take his temperature. He has not been using his compression pumps on the left leg because of pain. I advised him to go to the emergency room today for IV antibiotics for stents of left leg cellulitis but he has refused I have asked him to take 2 days off work to keep his leg elevated and he has refused this as well. In view of this I'm going to call him and Augmentin and doxycycline. 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 However after a detailed discussion regarding the risk benefits and alternatives he refuses to get admitted to the hospital. With no other recourse I will continue him on oral antibiotics as before and hopefully he'll have his infectious disease consultation this week. 06/16/2017 -- the patient was seen today by the nurse practitioner at infectious disease Ms. Dixon. 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 He has also been advise Lotrimin cream locally. she also had wise IV antibiotics for his condition if it flares up 06/23/17; patient arrives today with drainage bilaterally although the remaining wound on the left posterior calf after  cleaning up today "highlighter yellow drainage" did not look too bad. Unfortunately he has had breakdown on the right anterior leg [previously this leg had not been open and he is using a black stocking] he went to see infectious disease  and is been put on clindamycin 150 mg daily, I did not verify the dose although I'm not familiar with using clindamycin in this dosing range, perhaps for prophylaxisoo 06/27/17; I brought this patient back today to follow-up on the wound deterioration on the right lower leg together with surrounding cellulitis. I started him on doxycycline 4 days ago. This area looks better however he comes in today with intense cellulitis on the medial part of his left thigh. This is not have a wound in this area. Extremely tender. We've been using silver alginate to the wounds on the right lower leg left lower leg with bilateral 4 layer compression he is using his external compression pumps once a day 07/04/17; patient's left medial thigh cellulitis looks better. He has not been using his compression pumps as his insert said it was contraindicated with cellulitis. His right leg continues to make improvements all the wounds are still open. We only have one remaining wound on the left posterior calf. Using silver alginate to all open areas. He is on doxycycline which I started a week ago and should be finishing I gave him Augmentin after Thursday's visit for the severe cellulitis on the left medial thigh which fortunately looks better 07/14/17; the patient's left medial thigh cellulitis has resolved. The cellulitis in his right lower calf on the right also looks better. All of his wounds are stable to improved we've been using silver alginate he has completed the antibiotics I have given him. He has clindamycin 150 mg once a day prescribed by infectious disease for prophylaxis, I've advised him to start this now. We have been using bilateral Unna boots over silver alginate to the wound  areas 07/21/17; the patient is been to see infectious disease who noted his recurrent problems with cellulitis. He was not able to tolerate prophylactic clindamycin therefore he is on amoxicillin 500 twice a day. He also had a second daily dose of Lasix added By Dr. Oneta Rack but he is not taking this. Nor is he being completely compliant with his compression pumps a especially not this week. He has 2 remaining wounds one on the right posterior lateral lower leg and one on the left posterior medial lower leg. 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 The patient has Unna boots bilaterally. 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 He has bilateral Unna boots for compression and silver alginate to his wounds. Arrives today with his legs looking as good as I have seen him in quite some time. Not surprisingly his wounds look better as well with improvement on the right lateral leg venous insufficiency wound and also the left medial leg. He is still using the compression pumps once a day 08/11/17; both legs appear to be doing better wounds on the right lateral and left medial legs look better. Skin on the right leg quite good. He is been using silver alginate as the primary dressing. I'm going to use Anasept gel calcium alginate and maintain all the secondary dressings 08/18/17; the patient continues to actually do quite well. The area on his right lateral leg is just about closed the left medial also looks better although it is still moist in this area. His edema is well controlled we have been using Anasept gel with calcium alginate and  the usual secondary dressings, 4 layer compression and once daily use of his compression pumps "always been able to manage 09/01/17; the patient  continues to do reasonably well in spite of his trip to [place]. The area on the right lateral leg is epithelialized. Left is much better but still open. He has more edema and more chronic erythema on the left leg [venous inflammation] 09/08/17; he arrives today with no open wound on the right lateral leg and decently controlled edema. Unfortunately his left leg is not nearly as in his good situation as last week.he apparently had increasing edema starting on Saturday. He edema soaked through into his foot so used a plastic bag to walk around his home. The area on the medial right leg which was his open area is about the same however he has lost surface epithelium on the left lateral which is new and he has significant pain in the Achilles area of the left foot. He is already on amoxicillin chronically for prophylaxis of cellulitis in the left leg 09/15/17; he is completed a week of doxycycline and the cellulitis in the left posterior leg and Achilles area is as usual improved. He still has a lot of edema and fluid soaking through his dressings. There is no open wound on the right leg. He saw infectious disease NP today 09/22/17;As usual 1 we transition him from our compression wraps to his stockings things did not go well. He has several small open areas on the right leg. He states this was caused by the compression wrap on his skin although he did not wear this with the stockings over them. He has several superficial areas on the left leg medially laterally posteriorly. He does not have any evidence of active cellulitis especially involving the left Achilles The patient is traveling from [facility] Saturday going to [facility]. He states he isn't attempting to get an appointment with a heel objects wound center there to change his dressings. I am not completely certain whether this will work 10/06/17; the patient came in on Friday for a nurse visit and the nurse reported that his legs actually look  quite good. He arrives in clinic today for his regular follow-up visit. He has a new wound on his left third toe over the PIP probably caused by friction with his footwear. He has small areas on the left leg and a very superficial but epithelialized area on the right anterior lateral lower leg. Other than that his legs look as good as I've seen him in quite some time. We have been using silver alginate Review of systems; no chest pain no shortness of breath other than this a 10 point review of systems negative 10/20/17; seen by Dr. Meyer Russel last week. He had taken some antibiotics [doxycycline] that he had left over. Dr. Meyer Russel thought he had candida infection and declined to give him further antibiotics. He has a small wound remaining on the right lateral leg several areas on the left leg including a larger area on the left posterior several left medial and anterior and a small wound on the left lateral. The area on the left dorsal third toe looks a lot better. ROS; Gen.; no fever, respiratory no cough no sputum Cardiac no chest pain other than this 10 point review of system is negative 10/30/17; patient arrives today having fallen in the bathtub 3 days ago. It took him a while to get up. He has pain and maceration in the wounds on his left  leg which have deteriorated. He has not been using his pumps he also has some maceration on the right lateral leg. 11/03/17; patient continues to have weeping edema especially in the left leg. This saturates his dressings which were just put on on 12/27. As usual the doxycycline seems to take care of the cellulitis on his lower leg. He is not complaining of fever, chills, or other systemic symptoms. He states his leg feels a lot better on the doxycycline I gave him empirically. He also apparently gets injections at his primary doctor's officeo Rocephin for cellulitis prophylaxis. I didn't ask him about his compression pump compliance today I think that's probably  marginal. Arrives in the clinic with all of his dressings primary and secondary macerated full of fluid and he has bilateral edema 11/10/17; the patient's right leg looks some better although there is still a cluster of wounds on the right lateral. The left leg is inflamed with almost circumferential skin loss medially to laterally although we are still maintaining anteriorly. He does not have overt cellulitis there is a lot of drainage. He is not using compression pumps. We have been using silver alginate to the wound areas, there are not a lot of options here 11/17/17; the patient's right leg continues to be stable although there is still open wounds, better than last week. The inflammation in the left leg is better. Still loss of surface layer epithelium especially posteriorly. There is no overt cellulitis in the amount of edema and his left leg is really quite good, tells me he is using his compression pumps once a day. 11/24/17; patient's right leg has a small superficial wound laterally this continues to improve. The inflammation in the left leg is still improving however we have continuous surface layer epithelial loss posteriorly. There is no overt cellulitis in the amount of edema in both legs is really quite good. He states he is using his compression pumps on the left leg once a day for 5 out of 7 days 12/01/17; very small superficial areas on the right lateral leg continue to improve. Edema control in both legs is better today. He has continued loss of surface epithelialization and left posterior calf although I think this is better. We have been using silver alginate with large number of absorptive secondary dressings 4 layer on the left Unna boot on the right at his request. He tells me he is using his compression pumps once a day 12/08/17; he has no open area on the right leg is edema control is good here. On the left leg however he has marked erythema and tenderness breakdown of skin. He has  what appears to be a wrap injury just distal to the popliteal fossa. 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 There was swelling erythema and breakdown in the right leg. He has much worse skin breakdown in the left leg as well multiple open areas medially and posteriorly as well as laterally. He tells me he has been using his compression pumps but tells me he feels that the drainage out of his leg is worse when he uses a compression pumps. T be fair to him he is been saying this o for a while however I don't  know that I have really been listening to this. I wonder if the compression pumps are working properly 12/22/17;. Once again he arrives with severe erythema, weeping edema from the left greater than right leg. Noncompliance with compression pumps. New this visit he is complaining of pain on the lateral aspect of the right leg and the medial aspect of his right thigh. He apparently saw his cardiologist Dr. [name] who was ordered an echocardiogram area and I think this is a step in the right direction 12/25/17; started his doxycycline Monday night. There is still intense erythema of the right leg especially in the anterior thigh although there is less tenderness. The erythema around the wound on the right lateral calf also is less tender. He still complaining of pain in the left heel. His wounds are about the same right lateral left medial left lateral. Superficial but certainly not close to closure. He denies being systemically unwell no fever chills no abdominal pain no diarrhea 12/29/17; back in follow-up of his extensive right calf and right thigh cellulitis. I added amoxicillin to cover possible doxycycline resistant  strep. This seems to of done the trick he is in much less pain there is much less erythema and swelling. He has his echocardiogram at 11:00 this morning. X-ray of the left heel was also negative. 01/05/18; the patient arrived with his edema under much better control. Now that he is retired he is able to use his compression pumps daily and sometimes twice a day per the patient. He has a wound on the right leg the lateral wound looks better. Area on the left leg also looks a lot better. He has no evidence of cellulitis in his bilateral thighs I had a quick peak at his echocardiogram. He is in normal ejection fraction and normal left ventricular function. He has moderate pulmonary hypertension moderately reduced right ventricular function. One would have to wonder about chronic sleep apnea although he says he doesn't snore. He'll review the echocardiogram with his cardiologist. 01/12/18; the patient arrives with the edema in both legs under exemplary control. He is using his compression pumps daily and sometimes twice daily. His wound on the right lateral leg is just about closed. He still has some weeping areas on the posterior left calf and lateral left calf although everything is just about closed here as well. I have spoken with Aldean Baker who is the patient's nurse practitioner and infectious disease. She was concerned that the patient had not understood that the parenteral penicillin injections he was receiving for cellulitis prophylaxis was actually benefiting him. I don't think the patient actually saw that I would tend to agree we were certainly dealing with less infections although he had a serious one last month. 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 He is healed. He'll be placed in juxtalite compression wraps and increase his lymphedema pumps to twice daily. We will follow up again next week to ensure there are no issues with the new  regiment. 01/20/18-he is here for evaluation of bilateral lower extremity weeping edema. Yesterday he was placed in compression wrap to the right lower extremity and compression stocking to left lower shrubbery. He states he uses lymphedema pumps last night and again this morning and noted a blister to the left lower extremity. On exam he was noted to have drainage to the right lower extremity. He will be placed in Unna boots bilaterally and follow-up next week 01/26/18; patient was actually discharged a week  ago to his own juxta light stockings only to return the next day with bilateral lower extremity weeping edema.he was placed in bilateral Unna boots. He arrives today with pain in the back of his left leg. There is no open area on the right leg however there is a linear/wrap injury on the left leg and weeping edema on the left leg posteriorly. I spoke with infectious disease about 10 days ago. They were disappointed that the patient elected to discontinue prophylactic intramuscular penicillin shots as they felt it was particularly beneficial in reducing the frequency of his cellulitis. I discussed this with the patient today. He does not share this view. He'll definitely need antibiotics today. Finally [place] and returning a week later and he does not travel with his pumps. He is going by car 01/30/18; patient was seen 4 days ago and brought back in today for review of cellulitis in the left leg posteriorly. I put him on amoxicillin this really hasn't helped as much as I might like. He is also worried because [place]. Finally we will be rewrapping him. There is no open area on the right leg over his left leg has multiple weeping areas as usual 02/09/18; The same wrap on for 10 days. He did not pick up the last doxycycline I prescribed for him. He apparently took 4 days worth he already had. There is nothing open on  his right leg and the edema control is really quite good. He's had damage in the left leg medially and laterally especially probably related to the prolonged use of Unna boots 02/12/18; the patient arrived in clinic today for a nurse visit/wrap change. He complained of a lot of pain in the left posterior calf. He is taking doxycycline that I previously prescribed for him. Unfortunately even though he used his stockings and apparently used to compression pumps twice a day he has weeping edema coming out of the lateral part of his right leg. This is coming from the lower anterior lateral skin area. 02/16/18; the patient has finished his doxycycline and will finish the amoxicillin 2 days. The area of cellulitis in the left calf posteriorly has resolved. He is no longer having any pain. He tells me he is using his compression pumps at least once a day sometimes twice. 02/23/18; the patient finished his doxycycline and Amoxil last week. On Friday he noticed a small erythematous circle about the size of a quarter on the left lower leg just above his ankle. This rapidly expanded and he now has erythema on the lateral and posterior part of the thigh. This is bright red. Also has an area on the dorsal foot just above his toes and a tender area just below the left popliteal fossa. He came off his prophylactic penicillin injections at his own insistence one or 2 months ago. This is obviously deteriorated since then 03/02/18; patient is on doxycycline and Amoxil. Culture I did last week of the weeping area on the back of his left calf grew group B strep. I have therefore renewed the amoxicillin 500 3 times a day for a further week. He has not been systemically unwell. Still complaining of an area of discomfort right under his left popliteal fossa. There is no open wound on the right leg. He tells me that he is using his pumps twice a day on most days 03/09/18; patient arrives in clinic today completing his  amoxicillin  today. The cellulitis on his left leg is better. Furthermore he tells me that he had intramuscular penicillin shots that his primary care office today. However he also states that the wrap on his right leg fell down shortly after leaving clinic last week. He developed a large blister that was present when he came in for a nurse visit later in the week and then he developed intense discomfort around this area.He tells me he is using his compression pumps 03/16/18; the patient has completed his doxycycline. The infectious part of this/cellulitis in the left heel area left popliteal area is a lot better. He has 2 open areas on the right calf. Still areas on the left calf but this is a lot better as well. 03/24/18; the patient arrives complaining of pain in the left popliteal area again. He thinks some of this is wrap injury. He has no open area on the right leg and really no open area on the left calf either except for the popliteal area. He claims to be compliant with the compression pumps 03/31/18; I gave him doxycycline last week because of cellulitis in the left popliteal area. This is a lot better although the surface epithelium is denuded off and response to this. He arrives today with uncontrolled edema in the right calf area as well as a fingernail injury in the right lateral calf. There is only a few open areas on the left 04/06/18; I gave him amoxicillin doxycycline over the last 2 weeks that the amoxicillin should be completing currently. He is not complaining of any pain or systemic symptoms. The only open areas see has is on the right lateral lower leg paradoxically I cannot see anything on the left lower leg. He tells me he is using his compression pumps twice a day on most days. Silver alginate to the wounds that are open under 4 layer compression 04/13/18; he completed antibiotics and has no new complaints. Using his compression pumps. Silver alginate that anything that's opened 04/20/18; he is  using his compression pumps religiously. Silver alginate 4 layer compression anything that's opened. He comes in today with no open wounds on the left leg but 3 on the right including a new one posteriorly. He has 2 on the right lateral and one on the right posterior. He likes Unna boots on the right leg for reasons that aren't really clear we had the usual 4 layer compression on the left. It may be necessary to move to the 4 layer compression on the right however for now I left them in the Unna boots 04/27/18; he is using his compression pumps at least once a day. He has still the wounds on the right lateral calf. The area right posteriorly has closed. He does not have an open wound on the left under 4 layer compression however on the dorsal left foot just proximal to the toes and the left third toe 2 small open areas were identified 05/11/18; he has not uses compression pumps. The areas on the right lateral calf have coalesced into one large wound necrotic surface. On the left side he has one small wound anteriorly however the edema is now weeping out of a large part of his left leg. He says he wasn't using his pumps because of the weeping fluid. I explained to him that this is the time he needs to pump more 05/18/18; patient states he is using his compression pumps twice a day. The area on the right lateral large  wound albeit superficial. On the left side he has innumerable number of small new wounds on the left calf particularly laterally but several anteriorly and medially. All these appear to have healthy granulated base these look like the remnants of blisters however they occurred under compression. The patient arrives in clinic today with his legs somewhat better. There is certainly less edema, less multiple open areas on the left calf and the right anterior leg looks somewhat better as well superficial and a little smaller. However he relates pain and erythema over the last 3-4 days in the thigh  and I looked at this today. He has not been systemically unwell no fever no chills no change in blood sugar values 05/25/18; comes in today in a better state. The severe cellulitis on his left leg seems better with the Keflex. Not as tender. He has not been systemically unwell Hard to find an open wound on the left lower leg using his compression pumps twice a day The confluent wounds on his right lateral calf somewhat better looking. These will ultimately need debridement I didn't do this today. 06/01/18; the severe cellulitis on the left anterior thigh has resolved and he is completed his Keflex. There is no open wound on the left leg however there is a superficial excoriation at the base of the third toe dorsally. Skin on the bottom of his left foot is macerated looking. The left the wounds on the lateral right leg actually looks some better although he did require debridement of the top half of this wound area with an open curet 06/09/18 on evaluation today patient appears to be doing poorly in regard to his right lower extremity in particular this appears to likely be infected he has very thick purulent discharge along with a bright green tent to the discharge. This makes me concerned about the possibility of pseudomonas. He's also having increased discomfort at this point on evaluation. Fortunately there does not appear to be any evidence of infection spreading to the other location at this time. 06/16/18 on evaluation today patient appears to actually be doing fairly well. His ulcer has actually diminished in size quite significantly at this point which is good news. Nonetheless he still does have some evidence of infection he did see infectious disease this morning before coming here for his appointment. I did review the results of their evaluation and their note today. They did actually have him discontinue the Cipro and initiate treatment with linezolid at this time. He is doing this for the  next seven days and they recommended a follow-up in four months with them. He is the keep a log of the need for intermittent antibiotic therapy between now and when he falls back up with infectious disease. This will help them gaze what exactly they need to do to try and help them out. 06/23/18; the patient arrives today with no open wounds on the left leg and left third toe healed. He is been using his compression pumps twice a day. On the right lateral leg he still has a sizable wound but this is a lot better than last time I saw this. 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 Has been using silver alginate under 4 layer compression 06/30/18; the only open wound he has is on the right lateral leg and this looks healthy. No debridement is required. We have been using silver alginate.  He does not have an open wound on the left leg. There is apparently some drainage from the dorsal proximal third toe on the left although I see no open wound here. 07/03/18 on evaluation today patient was actually here just for a nurse visit rapid change. However when he was here on Wednesday for his rat change due to having been healed on the left and then developing blisters we initiated the wrap again knowing that he would be back today for [place] to reevaluate and see were at. Unfortunately he has developed some cellulitis into the proximal portion of his right lower extremity even into the region of his thigh. 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 He was placed on one as what at that point. Nonetheless he is done with that and has been tolerating it well otherwise. Doxycycline which in the past really did not seem to be effective for him. Nonetheless I think the best option may be for [place] to definitely reinitiate the antibiotics for a longer period of time. 07/07/18; since I  last saw this patient a week ago he has had a difficult time. At that point he did not have an open wound on his left leg. We transitioned him into juxta light stockings. He was apparently in the clinic the next day with blisters on the left lateral and left medial lower calf. He also had weeping edema fluid. He was put back into a compression wrap. He was also in the clinic on Friday with intense erythema in his right thigh. Per the patient he was started on Bactrim however that didn't work at all in terms of relieving his pain and swelling. He has taken 3 doxycycline that he had left over from last time and that seems to of helped. He has blistering on the right thigh as well. 07/14/18; the erythema on his right thigh has gotten better with doxycycline that he is finishing. 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 Presumably this cellulitis in the thigh was not related to that although I think this is a bit concerning going forward. He still has an area on the right lateral calf the blister on the right medial calf just below the knee that was discussed above. On the left 2 small open areas left medial and left lateral. Edema control is adequate. He is using his compression pumps twice a day 07/20/18; continued improvement in the condition of both legs especially the edema in his bilateral thighs. He tells me he is been losing weight through a combination of diet and exercise. He is using his compression pumps twice a day. So overall she made to the remaining wounds 07/27/2018; continued improvement in condition of both legs. His edema is well controlled. The area on the right lateral leg is just about closed he had one blisters show up on the medial left upper calf. We have him in 4 layer compression. He is going on a 10-day trip to [place]. He will be driving. He wants to wear Unna boots because of  the lessening amount of constriction. He will not use compression pumps while he is away 08/05/18 on evaluation today patient actually appears to be doing decently well all things considered in regard to his bilateral lower extremities. The worst ulcer is actually only posterior aspect of his left lower extremity  with a four layer compression wrap cut into his leg a couple weeks back. He did have a trip and actually had [employer] for the trip that he is worn since he was last here. Nonetheless he feels like the [employer] actually do better for him his swelling is up a little bit but he also with his trip was not taking his Lasix on a regular set schedule like he was supposed to be. He states that obviously the reason being that he cannot drive and keep going without having to urinate too frequently which makes it difficult. He did not have his pumps with him while he was away either which I think also maybe playing a role here too. 08/13/2018; the patient only has a small open wound on the right lateral calf which is a big improvement in the last month or 2. He also has the area posteriorly just below the posterior fossa on the left which I think was a wrap injury from several weeks ago. He has no current evidence of cellulitis. He tells me he is back into his compression pumps twice a day. He also tells me that while he was at the laundromat somebody stole a section of his extremitease stockings 08/20/2018; back in the clinic with a much improved state. He only has small areas on the right lateral mid calf which is just about healed. This was is more substantial area for quite a prolonged period of time. He has a small open area on the left anterior tibia. The area on the posterior calf just below the popliteal fossa is closed today. He is using his compression pumps twice a day 08/28/2018; patient has no open wound on the right leg. He has a smattering of open areas on the calf with some  weeping lymphedema. More problematically than that it looks as though his wraps of slipped down in his usual he has very angry upper area of edema just below the right medial knee and on the right lateral calf. He has no open area on his feet. The patient is traveling to [facility] next week. I will send him in an antibiotic. We will continue to wrap the right leg. We ordered extremitease stockings for him last week and I plan to transition the right leg to a stocking when he gets home which will be in 10 days time. As usual he is very reluctant to take his pumps with him when he travels 09/07/2018; patient returns from [facility]. He shows me a picture of his left leg in the mid part of his trip last week with intense fire engine erythema. The picture look bad enough I would have considered sending him to the hospital. Instead he went to the wound care center in [facility]. They did not prescribe him antibiotics but he did take some doxycycline he had leftover from a previous visit. I had given him trimethoprim sulfamethoxazole before he left this did not work according to the patient. This is resulted in some improvement fortunately. He comes back with a large wound on the left posterior calf. Smaller area on the left anterior tibia. Denuded blisters on the dorsal left foot over his toes. Does not have much in the way of wounds on the right leg although he does have a very tender area on the right posterior area just below the popliteal fossa also suggestive of infection. He promises me he is back on his pumps twice a day  09/15/2018; the intense cellulitis in his left lower calf is a lot better. The wound area on the posterior left calf is also so better. However he has reasonably extensive wounds on the dorsal aspect of his second and third toes and the proximal foot just at the base of the toes. There is nothing open on the right leg 09/22/2018; the patient has excellent edema  control in his legs bilaterally. He is using his external compression pumps twice a day. He has no open area on the right leg and only the areas in the left foot dorsally second and third toe area on the left side. He does not have any signs of active cellulitis. 10/06/2018; the patient has good edema control bilaterally. He has no open wound on the right leg. There is a blister in the posterior aspect of his left calf that we had to deal with today. He is using his compression pumps twice a day. There is no signs of active cellulitis. We have been using silver alginate to the wound areas. He still has vulnerable areas on the base of his left first second toes dorsally He has a his extremities stockings and we are going to transition him today into the stocking on the right leg. He is cautioned that he will need to continue to use the compression pumps twice a day. If he notices uncontrolled edema in the right leg he may need to go to 3 times a day. 10/13/2018; the patient came in for a nurse check on Friday he has a large flaccid blister on the right medial calf just below the knee. We unroofed this. He has this and a new area underneath the posterior mid calf which was undoubtedly a blister as well. He also has several small areas on the right which is the area we put his extremities stocking on. 10/19/2018; the patient went to see infectious disease this morning I am not sure if that was a routine follow-up in any case the doxycycline I had given him was discontinued and started on linezolid. He has not started this. It is easy to look at his left calf and the inflammation and think this is cellulitis however he is very tender in the tissue just below the popliteal fossa and I have no doubt that there is infection going on here. He states the problem he is having is that with the compression pumps the edema goes down and then starts walking the wrap falls down. We will see if we can adhere this. He  has 1 or 2 minuscule open areas on the right still areas that are weeping on the posterior left calf, the base of his left second and third toes 10/26/18; back today in clinic with quite of skin breakdown in his left anterior leg. This may have been infection the area below the popliteal fossa seems a lot better however tremendous epithelial loss on the left anterior mid tibia area over quite inexpensive tissue. He has 2 blisters on the right side but no other open wound here. 10/29/2018; came in urgently to see [place] today and we worked him in for review. He states that the 4 layer compression on the right leg caused pain he had to cut it down to roughly his mid calf this caused swelling above the wrap and he has blisters and skin breakdown today. As a result of the pain he has not been using his pumps. Both legs are a lot more edematous and there is  a lot of weeping fluid. 11/02/18; arrives in clinic with continued difficulties in the right leg> left. Leg is swollen and painful. multiple skin blisters and new open areas especially laterally. He has not been using his pumps on the right leg. 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 He is not systemically unwell. 11/09/2018; the patient claims he is being compliant with his pumps. He is finished the doxycycline I gave him last week. Culture I did of the wound on the right lateral leg showed a few very resistant methicillin staph aureus. This was resistant to doxycycline. Nevertheless he states the pain in the leg is a lot better which makes me wonder if the cultured organism was not really what was causing the problem nevertheless this is a very dangerous organism to be culturing out of any wound. His right leg is still a lot larger than the left. He is using an Radio broadcast assistant on this area, he blames a 4-layer compression for causing the original skin breakdown which I doubt is true however I cannot talk him out  of it. We have been using silver alginate to all of these areas which were initially blisters 11/16/2018; patient is being compliant with his external compression pumps at twice a day. Miraculously he arrives in clinic today with absolutely no open wounds. He has better edema control on the left where he has been using 4 layer compression versus wound of wounds on the right and I pointed this out to him. There is no inflammation in the skin in his lower legs which is also somewhat unusual for him. There is no open wounds on the dorsal left foot. He has extremitease stockings at home and I have asked him to bring these in next week. 11/25/18 patient's lower extremity on examination today on the left appears for the most part to be wound free. He does have an open wound on the lateral aspect of the right lower extremity but this is minimal compared to what I've seen in past. He does request that we go ahead and wrap the left leg as well even though there's nothing open just so hopefully it will not reopen in short order. 1/28; patient has superficial open wounds on the right lateral calf left anterior calf and left posterior calf. His edema control is adequate. 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 We have been using silver alginate as the primary dressing. He claims compliance with his compression pumps 2/4; patient has superficial open wounds on numerous areas of his left calf and again one on the left dorsal foot. The areas on the right lateral calf have healed. The cellulitis that I gave him doxycycline for last week is also resolved this was mostly on the left anterior calf just below the tibial tuberosity. His edema looks fairly well-controlled. He tells me he went to see his primary doctor today and had blood work ordered 2/11; once again he has several open areas on the left calf left tibial area. Most of  these are small and appear to have healthy granulation. He does not have anything open on the right. The edema and control in his thighs is pretty good which is usually a good indication he has been using his pumps as requested. 2/18; he continues to have several small areas on the left calf and left tibial area. Most of these  are small healthy granulation. We put him in his stocking on the right leg last week and he arrives with a superficial open area over the right upper tibia and a fairly large area on the right lateral tibia in similar condition. His edema control actually does not look too bad, he claims to be using his compression pumps twice a day 2/25. Continued small areas on the left calf and left tibial area. New areas especially on the right are identified just below the tibial tuberosity and on the right upper tibia itself. There are also areas of weeping edema fluid even without an obvious wound. He does not have a considerable degree of lymphedema but clearly there is more edema here than his skin can handle. He states he is using the pumps twice a day. We have an Unna boot on the right and 4 layer compression on the left. 3/3; he continues to have an area on the right lateral calf and right posterior calf just below the popliteal fossa. There is a fair amount of tenderness around the wound on the popliteal fossa but I did not see any evidence of cellulitis, could just be that the wrap came down and rubbed in this area. He does not have an open area on the left leg however there is an area on the left dorsal foot at the base of the third toe We have been using silver alginate to all wound areas 3/10; he did not have an open area on his left leg last time he was here a week ago. T oday he arrives with a horizontal wound just below the tibial tuberosity and an area on the left lateral calf. He has intense erythema and tenderness in this area. The area is on the right lateral calf and  right posterior calf better than last week. We have been using silver alginate as usual 3/18 - Patient returns with 3 small open areas on left calf, and 1 small open area on right calf, the skin looks ok with no significant erythema, he continues the UNA boot on right and 4 layer compression on left. The right lateral calf wound is closed , the right posterior is small area. we will continue silver alginate to the areas. 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 He is been tolerating the dressing changes without complication. Fortunately she has made excellent improvement in regard to the overall status of his wounds. Unfortunately every time we cease wrapping him he ends up reopening in causing more significant issues at that point. Again I'm unsure of the best direction to take although I think the lymphedema clinic may be appropriate for him. 02/03/19 on evaluation today patient appears to be doing well in regard to the wounds that we saw him for last week unfortunately he has a new area on the proximal portion of his right medial/posterior lower extremity where the wrap somewhat slowed down and caused swelling and a blister to rub and open. Unfortunately this is the only opening that he has on either leg at this point. 02/17/19 on evaluation today patient's bilateral lower extremities appear to be doing well. He still completely healed in regard to the left lower extremity. In regard to the right lower extremity the area where the wrap and slid down and caused the blister  still seems to be slightly open although this is dramatically better than during the last evaluation two weeks ago. I'm very pleased with the way this stands overall. 03/03/19 on evaluation today patient appears to be doing well in regard  to his right lower extremity in general although he did have a new blister open this does not appear to be showing any evidence of active infection at this time. Fortunately there's No fevers, chills, nausea, or vomiting noted at this time. Overall I feel like he is making good progress it does feel like that the right leg will we perform the [employer] seems to do with a bit better than three layer wrap on the left which slid down on him. We may switch to doing bilateral in the book wraps. 5/4; I have not seen Mr. Pareja in quite some time. According to our case manager he did not have an open wound on his left leg last week. He had 1 remaining wound on the right posterior medial calf. He arrives today with multiple openings on the left leg probably were blisters and/or wrap injuries from Unna boots. I do not think the Unna boot's will provide adequate compression on the left. I am also not clear about the frequency he is using the compression pumps. 03/17/19 on evaluation today patient appears to be doing excellent in regard to his lower extremities compared to last week's evaluation apparently. He had gotten significantly worse last week which is unfortunate. The [employer] wrap on the left did not seem to do very well for him at all and in fact it didn't control his swelling significantly enough he had an additional outbreak. Subsequently we go back to the four layer compression wrap on the left. This is good news. At least in that he is doing better and the wound seem to be killing him. He still has not heard anything from the lymphedema clinic. 03/24/19 on evaluation today patient actually appears to be doing much better in regard to his bilateral lower Trinity as compared to last week when I saw him. Fortunately there's no signs of active infection at this time. He has been tolerating the dressing changes without complication. Overall I'm extremely pleased with the progress and appearance in  general. 04/07/19 on evaluation today patient appears to be doing well in regard to his bilateral lower extremities. His swelling is significantly down from where it was previous. With that being said he does have a couple blisters still open at this point but fortunately nothing that seems to be too severe and again the majority of the larger openings has healed at this time. 04/14/19 on evaluation today patient actually appears to be doing quite well in regard to his bilateral lower extremities in fact I'm not even sure there's anything significantly open at this time at any site. Nonetheless he did have some trouble with these wraps where they are somewhat irritating him secondary to the fact that he has noted that the graph wasn't too close down to the end of this foot in a little bit short as well up to his knee. Otherwise things seem to be doing quite well. 04/21/19 upon evaluation today patient's wound bed actually showed evidence of being completely healed in regard to both lower extremities which is excellent news. There does not appear to be any signs of active infection which is also good news. I'm very pleased in this regard. No fevers, chills, nausea, or vomiting noted at this time. 04/28/19  on evaluation today patient appears to be doing a little bit worse in regard to both lower extremities on the left mainly due to the fact that when he went infection disease the wrap was not wrapped quite high enough he developed a blister above this. On the right he is a small open area of nothing too significant but again this is continuing to give him some trouble he has been were in the Velcro compression that he has at home. 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 He's been tolerating the dressing changes without complication. Fortunately there's no signs of active infection at this time. No fevers, chills, nausea, or vomiting noted at  this time. We have been trying to get an appointment with her lymphedema clinic in [facility] but unfortunately nobody can get them on phone with not been able to even fax information over the patient likewise is not been able to get in touch with them. Overall I'm not sure exactly what's going on here with to reach out again today. 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 Still having a lot of drainage unfortunately. He tells me especially in the left but even on the right. There's no signs of active infection which is good news we've been using so ratcheted up to this point. 05/19/19 on evaluation today patient actually appears to be doing quite well with regard to his left lower extremity which is great news. Fortunately in regard to the right lower extremity has an issues with his wrap and he subsequently did remove this from what I'm understanding. Nonetheless long story short is what he had rewrapped once he removed it subsequently had maggots underneath this wrap whenever he came in for evaluation today. With that being said they were obviously completely cleaned away by the nursing staff. The visit today which is excellent news. However he does appear to potentially have some infection around the right ankle region where the maggots were located as well. He will likely require anabiotic therapy today. 05/26/19 on evaluation today patient actually appears to be doing much better in regard to his bilateral lower extremities. I feel like the infection is under much better control. With that being said there were maggots noted when the wrap was removed yet again today. Again this could have potentially been left over from previous although at this time there does not appear to be any signs of significant drainage there was obviously on the wrap some drainage as well this contracted gnats or  otherwise. Either way I do not see anything that appears to be doing worse in my pinion and in fact I think his drainage has slowed down quite significantly likely mainly due to the fact to his infection being under better control. 06/02/2019 on evaluation today patient actually appears to be doing well with regard to his bilateral lower extremities there is no signs of active infection at this time which is great news. With that being said he does have several open areas more so on the right than the left but nonetheless these are all significantly better than previously noted. 06/09/2019 on evaluation today patient actually appears to be doing well. His wrap stayed up and he did not cause any problems he had more drainage on the right compared to the left but overall I do not see any major issues at  this time which is great news. 06/16/2019 on evaluation today patient appears to be doing excellent with regard to his lower extremities the only area that is open is a new blister that can have opened as of today on the medial ankle on the left. Other than this he really seems to be doing great I see no major issues at this point. 06/23/2019 on evaluation today patient appears to be doing quite well with regard to his bilateral lower extremities. In fact he actually appears to be almost completely healed there is a small area of weeping noted of the right lower extremity just above the ankle. Nonetheless fortunately there is no signs of active infection at this time which is good news. No fevers, chills, nausea, vomiting, or diarrhea. 8/24; the patient arrived for a nurse visit today but complained of very significant pain in the left leg and therefore I was asked to look at this. Noted that he did not have an open area on the left leg last week nevertheless this was wrapped. The patient states that he is not been able to put his compression pumps on the left leg because of the discomfort. He has not been  systemically unwell 06/30/2019 on evaluation today patient unfortunately despite being excellent last week is doing much worse with regard to his left lower extremity today. In fact he had to come in for a nurse on Monday where his left leg had to be rewrapped due to excessive weeping Dr. Leanord Hawking placed him on doxycycline at that point. Fortunately there is no signs of active infection Systemically at this time which is good news. 07/07/2019 in regard to the patient's wounds today he actually seems to be doing well with his right lower extremity there really is nothing open or draining at this point this is great news. Unfortunately the left lower extremity is given him additional trouble at this time. There does not appear to be any signs of active infection nonetheless he does have a lot of edema and swelling noted at this point as well as blistering all of which has led to a much more poor appearing leg at this time compared to where it was 2 weeks ago when it was almost completely healed. Obviously this is a little discouraging for the patient. He is try to contact the lymphedema clinic in [place] he has not been able to get through to them. 07/14/2019 on evaluation today patient actually appears to be doing slightly better with regard to his left lower extremity ulcers. Overall I do feel like at least at the top of the wrap that we have been placing this area has healed quite nicely and looks much better. The remainder of the leg is showing signs of improvement. Unfortunately in the thigh area he still has an open region on the left and again on the right he has been utilizing just a Band-Aid on an area that also opened on the thigh. Again this is an area that were not able to wrap although we did do an Ace wrap to provide some compression that something that obviously is a little less effective than the compression wraps we have been using on the lower portion of the leg. He does have an  appointment with the lymphedema clinic in [facility] on Friday. 07/21/2019 on evaluation today patient appears to be doing better with regard to his lower extremity ulcers. He has been tolerating the dressing changes without complication. Fortunately there is no signs of active  infection at this time. No fevers, chills, nausea, vomiting, or diarrhea. I did receive the paperwork from the physical therapist at the lymphedema clinic in [place]. Subsequently I signed off on that this morning and sent that back to him for further progression with the treatment plan. 07/28/2019 on evaluation today patient appears to be doing very well with regard to his right lower extremity where I do not see any open wounds at this point. Fortunately he is feeling great as far as that is concerned as well. In regard to the left lower extremity he has been having issues with still several areas of weeping and edema although the upper leg is doing better his lower leg still I think is going require the compression wrap at this time. No fevers, chills, nausea, vomiting, or diarrhea. 08/04/2019 on evaluation today patient unfortunately is having new wounds on the right lower extremity. Again we have been using Unna boot wrap on that side. We switched him to using his juxta lite wrap at home. With that being said he tells me he has been using it although his legs extremely swollen and to be honest really does not appear that he has been. I cannot know that for sure however. Nonetheless he has multiple new wounds on the right lower extremity at this time. Obviously we will have to see about getting this rewrapped for him today. 08/11/2019 on evaluation today patient appears to be doing fairly well with regard to his wounds. He has been tolerating the dressing changes including the compression wraps without complication. He still has a lot of edema in his upper thigh regions bilaterally he is supposed to be seeing the  lymphedema clinic on the 15th of this month once his wraps arrive for the upper part of his legs. 08/18/2019 on evaluation today patient appears to be doing well with regard to his bilateral lower extremities at this point. He has been tolerating the dressing changes without complication. Fortunately there is no signs of active infection which is also good news. He does have a couple weeping areas on the first and second toe of the right foot he also has just a small area on the left foot upper leg and a small area on the left lower leg but overall he is doing quite well in my opinion. He is supposed to be getting his wraps shortly in fact tomorrow and then subsequently is seeing the lymphedema clinic next Wednesday on the 21st. Of note he is also leaving on the 25th to go on vacation for a week to the beach. For that reason and since there is some uncertainty about what there can be doing at lymphedema clinic next Wednesday I am get a make an appointment for next Friday here for [place] to see what we need to do for him prior to him leaving for vacation. 10/23; patient arrives in considerable pain predominantly in the upper posterior calf just distal to the popliteal fossa also in the wound anteriorly above the major wound. This is probably cellulitis and he has had this recurrently in the past. He has no open wound on the right side and he has had an Radio broadcast assistant in that area. Finally I note that he has an area on the left posterior calf which by enlarge is mostly epithelialized. This protrudes beyond the borders of the surrounding skin in the setting of dry scaly skin and lymphedema. The patient is leaving for [facility] on Sunday. Per his longstanding pattern, he  will not take his compression pumps with him predominantly out of fear that they will be stolen. He therefore asked that we put a Unna boot back on the right leg. He [place] also [place] if [place] can  change his dressing in the mid week. 11/3; [place] He was seen on 1 occasion at their wound care center. They did a 2 layer compression system as they did not have our 4-layer wrap. I am not completely certain what they put on the wounds. They did not change the Unna boot on the right. The patient is also seeing a lymphedema specialist [place]. It appears that he has some compression sleeve for his thighs which indeed look quite a bit better than I am used to seeing. He pumps over these with his external compression pumps. 11/10; the patient has a new wound on the right medial thigh otherwise there is no open areas on the right. He has an area on the left leg posteriorly anteriorly and medially and an area over the left second toe. We have been using silver alginate. He thinks the injury on his thigh is secondary to friction from the compression sleeve he has. 11/17; the patient has a new wound on the right medial thigh last week. He thinks this is because he did not have a underlying stocking for his thigh juxta lite apparatus. He now has this. The area is fairly large and somewhat angry but I do not think he has underlying cellulitis. He has a intact blister on the right anterior tibial area. Small wound on the right great toe dorsally Small area on the medial left calf. 11/30; the patient does not have any open areas on his right leg and we did not take his juxta lite stocking off. However he states that on Friday his compression wrap fell down lodging around his upper mid calf area. As usual this creates a lot of problems for him. He called urgently today to be seen for a nurse visit however the nurse visit turned into a provider visit because of extreme erythema and pain in the left anterior tibia extending laterally and posteriorly. The area that is problematic is extensive 10/06/2019 upon evaluation today patient actually  appears to be doing poorly in regard to his left lower extremity. He Dr. Leanord Hawkingobson did place him on doxycycline this past Monday apparently due to the fact that he was doing much worse in regard to this left leg. Fortunately the doxycycline does seem to be helping. Unfortunately we are still having a very difficult time getting his edema under any type of control in order to anticipate discharge at some point. The only way were really able to control his lymphedema really is with compression wraps and that has only even seemingly temporary. He has been seeing a lymphedema clinic they are trying to help in this regard but still this has been somewhat frustrating in general for the patient. 10/13/19 on evaluation today patient appears to be doing excellent with regard to his right lower extremity as far as the wounds are concerned. His swelling is still quite extensive unfortunately. He is still having a lot of drainage from the thigh areas bilaterally which is unfortunate. He's been going to lymphedema clinic but again he still really does not have this edema under control as far as his lower extremities are concern. With regard to his left lower  extremity this seems to be improving and I do believe the doxycycline has been of benefit for him. He is about to complete the doxycycline. 10/20/2019 on evaluation today patient appears to be doing poorly in regard to his bilateral lower extremities. More in the right thigh he has a lot of irritation at this site unfortunately. In regard to the left lower extremity the wrap was not quite as high it appears and does seem to have caused him some trouble as well. Fortunately there is no evidence of systemic infection though he does have some blue-green drainage which has me concerned for the possibility of Pseudomonas. He tells me he is previously taking Cipro without complications and he really does not care for Levaquin however due to some of the side effects he  has. He is not allergic to any medications specifically antibiotics that were aware of. 10/27/2019 on evaluation today patient actually does appear to be for the most part doing better when compared to last week's evaluation. With that being said he still has multiple open wounds over the bilateral lower extremities. He actually forgot to start taking the Cipro and states that he still has the whole bottle. He does have several new blisters on left lower extremity today I think I would recommend he go ahead and take the Cipro based on what I am seeing at this point. 12/30-Patient comes at 1 week visit, 4 layer compression wraps on the left and Unna boot on the right, primary dressing Xtrasorb and silver alginate. Patient is taking his Cipro and has a few more days left probably 5-6, and the legs are doing better. He states he is using his compressions devices which I believe he has 11/10/2019 on evaluation today patient actually appears to be much better than last time I saw him 2 weeks ago. His wounds are significantly improved and overall I am very pleased in this regard. Fortunately there is no signs of active infection at this time. He is just a couple of days away from completing Cipro. Overall his edema is much better he has been using his lymphedema pumps which I think is also helping at this point. 11/17/2019 on evaluation today patient appears to be doing excellent in regard to his wounds in general. His legs are swollen but not nearly as much as they have been in the past. Fortunately he is tolerating the compression wraps without complication. No fevers, chills, nausea, vomiting, or diarrhea. He does have some erythema however in the distal portion of his right lower extremity specifically around the forefoot and toes there is a little bit of warmth here as well. 11/24/2019 on evaluation today patient appears to be doing well with regard to his right lower extremity I really do not see any open  wounds at this point. His left lower extremity does have several open areas and his right medial thigh also is open. Other than this however overall the patient seems to be making good progress and I am very pleased at this point. 12/01/2019 on evaluation today patient appears to be doing poorly at this point in regard to his left lower extremity has several new blisters despite the fact that we have him in compression wraps. In fact he had a 4-layer compression wrap, his upper thigh wrapped from lymphedema clinic, and a juxta light over top of the 4 layer compression wrap the lymphedema clinic applied and despite all this he still develop blisters underneath. Obviously this does have me concerned about  the fact that unfortunately despite what we are doing to try to get wounds healed he continues to have new areas arise I do not think he is ever good to be at the point where he can realistically just use wraps at home to keep things under control. Typically when we heal him it takes about 1-2 days before he is back in the clinic with severe breakdown and blistering of his lower extremities bilaterally. This is happened numerous times in the past. Unfortunately I think that we may need some help as far as overall fluid overload to kind of limit what we are seeing and get things under better control. 12/08/2019 on evaluation today patient presents for follow-up concerning his ongoing bilateral lower extremity edema. Unfortunately he is still having quite a bit of swelling the compression wraps are controlling this to some degree but he did see Dr. [name] his cardiologist I do have that available for review today as far as the appointment was concerned that was on 12/06/2019. Obviously that she has been 2 days ago. The patient states that he is only been taking the Lasix 80 mg 1 time a day he had told me previously he was taking this twice a day. Nonetheless Dr. [name] recommended this be up to 80 mg 2 times a  day for the patient as he did appear to be fluid overloaded. With that being said the patient states he did this yesterday and he was unable to go anywhere or do anything due to the fact that he was constantly having to urinate. Nonetheless I think that this is still good to be something that is important for him as far as trying to get his edema under control at all things that he is going to be able to just expect his wounds to get under control and things to be better without going through at least a period of time where he is trying to stabilize his fluid management in general and I think increasing the Lasix is likely the first step here. It was also mentioned the possibility that the patient may require metolazone. With that being said he wanted to have the patient take Lasix twice a day first and then reevaluating 2 months to see where things stand. 12/15/2019 upon evaluation today patient appears to be doing regard to his legs although his toes are showing some signs of weeping especially on the left at this point to some degree on the right. There does not appear to be any signs of active infection and overall I do feel like the compression wraps are doing well for him but he has not been able to take the Lasix at home and the increased dose that Dr. [name] recommended. He tells me that just not go to be feasible for him. Nonetheless I think in this case he should probably send a message to Dr. [name] in order to discuss options from the standpoint of possible admission to get the fluid off or otherwise going forward. 12/22/2019 upon evaluation today patient appears to be doing fairly well with regard to his lower extremities at this point. In fact he would be doing excellent if it was not for the fact that his right anterior thigh apparently had an allergic reaction to adhesive tape that he used. The wound itself that we have been monitoring actually appears to be healed. There is a lot of  irritation at this point. 12/29/2019 upon evaluation today patient appears to be doing well in  regard to his lower extremities. His left medial thigh is open and somewhat draining today but this is the only region that is open the right has done much better with the treatment utilizing the steroid cream that I prescribed for him last week. Overall I am pleased in that regard. Fortunately there is no signs of active infection at this time. No fevers, chills, nausea, vomiting, or diarrhea. 01/05/2020 upon evaluation today patient appears to be doing more poorly in regard to his right lower extremity at this point upon evaluation today. Unfortunately he continues to have issues in this regard and I think the biggest issue is controlling his edema. This obviously is not very well controlled at this point is been recommended that he use the Lasix twice a day but he has not been able to do that. Unfortunately I think this is leading to an issue where honestly he is not really able to effectively control his edema and therefore the wounds really are not doing significantly better. I do not think that he is going to be able to keep things under good control unless he is able to control his edema much better. I discussed this again in great detail with him today. 01/12/2020 good news is patient actually appears to be doing quite well today at this point. He does have an appointment with lymphedema clinic tomorrow. His legs appear healed and the toe on the left is almost completely healed. In general I am very pleased with how things stand at this point. 01/19/2020 upon evaluation today patient appears to actually be doing well in regard to his lower extremities there is nothing open at this point. Fortunately he has done extremely well more recently. Has been seeing lymphedema clinic as well. With that being said he has Velcro wraps for his lower legs as well as his upper legs. The only wound really is on his toe  which is the right great toe and this is barely anything even there. With all that being said I think it is good to be appropriate today to go ahead and switch him over to the Velcro compression wraps. 01/26/2020 upon evaluation today patient appears to be doing worse with regard to his lower extremities after last week switch him to Velcro compression wraps. Unfortunately he lasted less than 24 hours he did not have the sock portion of his Velcro wrap on the left leg and subsequently developed a blister underneath the Velcro portion. Obviously this is not good and not what we were looking for at this point. He states the lymphedema clinic did tell him to wear the wrap for 23 hours and take him off for 1 I am okay with that plan but again right now we got a get things back under control again he may have some cellulitis noted as well. 02/02/2020 upon evaluation today patient unfortunately appears to have several areas of blistering on his bilateral lower extremities today mainly on the feet. His legs do seem to be doing somewhat better which is good news. Fortunately there is no evidence of active infection at this time. No fevers, chills, nausea, vomiting, or diarrhea. 02/16/2020 upon evaluation today patient appears to be doing well at this time with regard to his legs. He has a couple weeping areas on his toes but for the most part everything is doing better and does appear to be sealed up on his legs which is excellent news. We can continue with wrapping him at this point as  he had every time we discontinue the wraps he just breaks out with new wounds. There is really no point in is going forward with this at this point. 03/08/2020 upon evaluation today patient actually appears to be doing quite well with regard to his lower extremity ulcers. He has just a very superficial and really almost nonexistent blister on the left lower extremity he has in general done very well with the compression wraps. With  that being said I do not see any signs of infection at this time which is good news. 03/29/2020 upon evaluation today patient appears to be doing well with regard to his wounds currently except for where he had several new areas that opened up due to some of the wrap slipping and causing him trouble. He states he did not realize they had slipped. Nonetheless he has a 1 area on the right and 3 new areas on the left. Fortunately there is no signs of active infection at this time which is great news. 04/05/2020 upon evaluation today patient actually appears to be doing quite well in general in regard to his legs currently. Fortunately there is no signs of active infection at this time. No fevers, chills, nausea, vomiting, or diarrhea. He tells me next week that he will actually be seen in the lymphedema clinic on Thursday at 10 AM I see him on Wednesday next week. 04/12/2020 upon evaluation today patient appears to be doing very well with regard to his lower extremities bilaterally. In fact he does not appear to have any open wounds at this point which is good news. Fortunately there is no signs of active infection at this time. No fevers, chills, nausea, vomiting, or diarrhea. 04/19/2020 upon evaluation today patient appears to be doing well with regard to his wounds currently on the bilateral lower extremities. There does not appear to be any signs of active infection at this time. Fortunately there is no evidence of systemic infection and overall very pleased at this point. Nonetheless after I held him out last week he literally had blisters the next morning already which swelled up with him being right back here in the clinic. Overall I think that he is just not can be able to be discharged with his legs the way they are he is much to volume overloaded as far as fluid is concerned and that was discussed with him today of also discussed this but should try the clinic nurse manager as well as Dr.  Leanord Hawking. 04/26/2020 upon evaluation today patient appears to be doing better with regard to his wounds currently. He is making some progress and overall swelling is under good control with the compression wraps. Fortunately there is no evidence of active infection at this time. 05/10/2020 on evaluation today patient appears to be doing overall well in regard to his lower extremities bilaterally. He is Tolerating the compression wraps without complication and with what we are seeing currently I feel like that he is making excellent progress. There is no signs of active infection at this time. 05/24/2020 upon evaluation today patient appears to be doing well in regard to his legs. The swelling is actually quite a bit down compared to where it has been in the past. Fortunately there is no sign of active infection at this time which is also good news. With that being said he does have several wounds on his toes that have opened up at this point. 05/31/2020 upon evaluation today patient appears to be doing well with  regard to his legs bilaterally where he really has no significant fluid buildup at this point overall he seems to be doing quite well. Very pleased in this regard. With regard to his toes these also seem to be drying up which is excellent. We have continue to wrap him as every time we tried as a transition to the juxta light wraps things just do not seem to get any better. 06/07/2020 upon evaluation today patient appears to be doing well with regard to his right leg at this point. Unfortunately left leg has a lot of blistering he tells me the wrap started to slide down on him when he tried to put his other Velcro wrap over top of it to help keep things in order but nonetheless still had some issues. 06/14/2020 on evaluation today patient appears to be doing well with regard to his lower extremity ulcers and foot ulcers at this point. I feel like everything is actually showing signs of improvement which  is great news overall there is no signs of active infection at this time. No fevers, chills, nausea, vomiting, or diarrhea. 06/21/2020 on evaluation today patient actually appears to be doing okay in regard to his wounds in general. With that being said the biggest issue I see is on his right foot in particular the first and second toe seem to be doing a little worse due to the fact this is staying very wet. I think he is probably getting need to change out his dressings a couple times in between each week when we see him in regard to his toes in order to keep this drier based on the location and how this is proceeding. 06/28/2020 on evaluation today patient appears to be doing a little bit more poorly overall in regard to the appearance of the skin I am actually somewhat concerned about the possibility of him having a little bit of an infection here. We discussed the course of potentially giving him a doxycycline prescription which he is taken previously with good result. With that being said I do believe that this is potentially mild and at this point easily fixed. I just do not want anything to get any worse. 07/12/2020 upon evaluation today patient actually appears to be making some progress with regard to his legs which is great news there does not appear to be any evidence of active infection. Overall very pleased with where things stand. 07/26/2020 upon evaluation today patient appears to be doing well with regard to his leg ulcers and toe ulcers at this point. He has been tolerating the compression wraps without complication overall very pleased in this regard. 08/09/2020 upon evaluation today patient appears to be doing well with regard to his lower extremities bilaterally. Fortunately there is no signs of active infection overall I am pleased with where things stand. 08/23/2020 on evaluation today patient appears to be doing well with regard to his wound. He has been tolerating the dressing  changes without complication. Fortunately there is no signs of active infection at this time. Overall his legs seem to be doing quite well which is great news and I am very pleased in that regard. No fevers, chills, nausea, vomiting, or diarrhea. 09/13/2020 upon evaluation today patient appears to be doing okay in regard to his lower extremities. He does have a fairly large blister on the right leg which I did remove the blister tissue from today so we can get this to dry out other than that however he seems  to be doing quite well. There is no signs of active infection at this time. Electronic Signature(s) Signed: 09/13/2020 12:09:09 PM By: Lenda Kelp PA-C Entered By: Lenda Kelp on 09/13/2020 12:09:09 -------------------------------------------------------------------------------- Physical Exam Details Patient Name: Date of Service: [name], Detrell J. 09/13/2020 10:30 A M Medical Record Number: [id number] Patient Account Number: [id number] Date of Birth/Sex: Treating RN: 1951/06/19 (68 y.o. [name] Primary Care Provider: Nicoletta Ba Other Clinician: Referring Provider: Treating Provider/Extender: Adele Dan in Treatment: 242 Constitutional Obese and well-hydrated in no acute distress. Respiratory normal breathing without difficulty. Psychiatric this patient is able to make decisions and demonstrates good insight into disease process. Alert and Oriented x 3. pleasant and cooperative. Notes His wound bed actually showed signs of good granulation at this time there does not appear to be any evidence of active infection which is great news and overall I am extremely pleased with where things stand today. The patient in general tells me that he is feeling pretty good these days overall although again his legs still do swell significantly. Electronic Signature(s) Signed: 09/13/2020 12:09:26 PM By: Lenda Kelp PA-C Entered By: Lenda Kelp on 09/13/2020 12:09:25 -------------------------------------------------------------------------------- Physician Orders Details Patient Name: Date of Service: [name], Foy J. 09/13/2020 10:30 A M Medical Record Number: [id number] Patient Account Number: [id number] Date of Birth/Sex: Treating RN: 1951/01/11 (69 y.o. 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 - Wed with Leonard Schwartz Return A Nurse Visit: - 1 week Dressing Change Frequency Do not change entire dressing for one week. Skin Barriers/Peri-Wound Care Moisturizing lotion - both legs and feet and toes Other: - patient to get urea cream 40% to be used on feet and toes Wound Cleansing May shower with protection. Primary Wound Dressing lginate with Silver - to blister right lateral lower leg and any weeping areas Calcium A Secondary Dressing ABD pad - right lower leg Edema Control 4 layer compression: Left lower extremity Unna Boot to Right Lower Extremity Avoid standing for long periods of time Elevate legs to the level of the heart or above for 30 minutes daily and/or when sitting, a frequency of: - throughout the day Exercise regularly Electronic Signature(s) Signed: 09/13/2020 5:47:40 PM By: Lenda Kelp PA-C Signed: 09/13/2020 5:57:36 PM By: Zenaida Deed RN, BSN Entered By: Zenaida Deed on 09/13/2020 11:39:19 -------------------------------------------------------------------------------- Problem List Details Patient Name: Date of Service: [name], Jahaad J. 09/13/2020 10:30 A M Medical Record Number: [id number] Patient Account Number: [id number] Date of Birth/Sex: Treating RN: 01/26/1951 (69 y.o. [name] Primary Care Provider: Nicoletta Ba Other Clinician: Referring Provider: Treating Provider/Extender: Adele Dan in Treatment: 284 Active Problems ICD-10 Encounter Code Description Active Date MDM Diagnosis 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 skin 06/30/2018 No Yes L97.221 Non-pressure chronic ulcer of left calf limited to breakdown of skin 09/30/2016 No Yes  Z61.096 Chronic venous hypertension (idiopathic) with ulcer and inflammation of 01/22/2016 No Yes bilateral lower extremity I89.0 Lymphedema, not elsewhere classified 01/22/2016 No Yes E11.622 Type 2 diabetes mellitus with other skin ulcer 01/22/2016 No Yes 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, unspecified 01/22/2016 No Yes L03.116 Cellulitis of left lower limb 04/01/2017 No Yes Inactive Problems ICD-10 Code Description Active Date Inactive Date 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 skin 06/30/2017 06/30/2017 L97.521 Non-pressure chronic ulcer of other 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 of skin 04/27/2018 04/27/2018 L03.115 Cellulitis of right lower limb 12/22/2017 12/22/2017 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 06/30/2018 06/30/2018 L97.511 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 of skin 06/30/2018 06/30/2018 Resolved Problems Electronic Signature(s) Signed: 09/13/2020 11:12:17 AM By: Lenda Kelp PA-C Entered By:  Lenda Kelp on 09/13/2020 11:12:16 -------------------------------------------------------------------------------- Progress Note Details Patient Name: Date of Service: [name], Boomer J. 09/13/2020 10:30 A M Medical Record Number: [id number] Patient Account Number: [id number] Date of Birth/Sex: Treating RN: Aug 29, 1951 (69 y.o. [name] Primary Care Provider: Nicoletta Ba Other Clinician: Referring Provider: Treating Provider/Extender: Adele Dan in Treatment: [phone] Subjective Chief Complaint Information obtained from Patient patient is here for evaluation venous/lymphedema weeping History of Present Illness (HPI) Referred by PCP for consultation. Patient has long standing history of BLE venous stasis, no prior ulcerations. At beginning of month, developed cellulitis and weeping. Received IM Rocephin followed by Keflex and resolved. Wears compression stocking, appr 6 months old. Not sure strength. No present drainage. 01/22/16 this is a patient who is a type II diabetic on insulin. 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 He tells me he religiously wears pressure stockings of uncertain strength. He was here with weeping edema about 8 months ago but did not have an open wound. Roughly a month ago he had a reopening on his bilateral legs. He is been using bandages and Neosporin. He does not complain of pain. 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 Last BUN/creatinine I have is from 11/20/15 at 13 and 1.0 respectively 01/29/16; patient arrives today having tolerated the Profore wrap. He brought in his stockings and these are 18 mmHg stockings he bought from [facility]. The compression here is likely inadequate. He does not complain of pain or excessive drainage she has no systemic symptoms. The wound on the right  looks improved as does the one on the left although one on the left is more substantial with still tissue at risk below the actual wound area on the bilateral posterior calf 02/05/16; patient arrives with poor edema control. He states that we did put a 4 layer compression on it last week. No weight appear 5 this. 02/12/16; the area on the posterior right Has healed. The left Has a substantial wound that has necrotic surface eschar that requires a debridement with a curette. 02/16/16;the patient called or a Nurse visit secondary to increased swelling. He had been in earlier in the week with his right leg healed. He was transitioned to is on pressure stocking on the right leg with the only open wound on the left, a substantial area on the left posterior calf. Note he has a history of severe lower extremity edema, he has a history of chronic atrial fibrillation but not  heart failure per my notes but I'll need to research this. He is not complaining of chest pain shortness of breath or orthopnea. The intake nurse noted blisters on the previously closed right leg 02/19/16; this is the patient's regular visit day. I see him on Friday with escalating edema new wounds on the right leg and clear signs of at least right ventricular heart failure. I increased his Lasix to 40 twice a day. He is returning currently in follow-up. States he is noticed a decrease in that the edema 02/26/16 patient's legs have much less edema. There is nothing really open on the right leg. The left leg has improved condition of the large superficial wound on the posterior left leg 03/04/16; edema control is very much better. The patient's right leg wounds have healed. On the left leg he continues to have severe venous inflammation on the posterior aspect of the left leg. There is no tenderness and I don't think any of this is cellulitis. 03/11/16; patient's right leg is married healed and he is in his own stocking. The patient's left leg has  deteriorated somewhat. There is a lot of erythema around the wound on the posterior left leg. There is also a significant rim of erythema posteriorly just above where the wrap would've ended there is a new wound in this location and a lot of tenderness. Can't rule out cellulitis in this area. 03/15/16; patient's right leg remains healed and he is in his own stocking. The patient's left leg is much better than last review. His major wound on the posterior aspect of his left Is almost fully epithelialized. He has 3 small injuries from the wraps. Really. Erythema seems a lot better on antibiotics 03/18/16; right leg remains healed and he is in his own stocking. The patient's left leg is much better. The area on the posterior aspect of the left calf is fully epithelialized. His 3 small injuries which were wrap injuries on the left are improved only one seems still open his erythema has resolved 03/25/16; patient's right leg remains healed and he is in his own stocking. There is no open area today on the left leg posterior leg is completely closed up. His wrap injuries at the superior aspect of his leg are also resolved. He looks as though he has some irritation on the dorsal ankle but this is fully epithelialized without evidence of infection. 03/28/16; we discharged this patient on Monday. Transitioned him into his own stocking. There were problems almost immediately with uncontrolled swelling weeping edema multiple some of which have opened. He does not feel systemically unwell in particular no chest pain no shortness of breath and he does not feel 04/08/16; the edema is under better control with the Profore light wrap but he still has pitting edema. There is one large wound anteriorly 2 on the medial aspect of his left leg and 3 small areas on the superior posterior calf. Drainage is not excessive he is tolerating a Profore light well 04/15/16; put a Profore wrap on him last week. This is controlled is edema  however he had a lot of pain on his left anterior foot most of his wounds are healed 04/22/16 once again the patient has denuded areas on the left anterior foot which he states are because his wrap slips up word. He saw his primary physician today is on Lasix 40 twice a day and states that he his weight is down 20 pounds over the last 3 months. 04/29/16: Much improved.  left anterior foot much improved. He is now on Lasix 80 mg per day. Much improved edema control 05/06/16; I was hoping to be able to discharge him today however once again he has blisters at a low level of where the compression was placed last week mostly on his left lateral but also his left medial leg and a small area on the anterior part of the left foot. 05/09/16; apparently the patient went home after his appointment on 7/4 later in the evening developing pain in his upper medial thigh together with subjective fever and chills although his temperature was not taken. The pain was so intense he felt he would probably have to call 911. However he then remembered that he had leftover doxycycline from a previous round of antibiotics and took these. By the next morning he felt a lot better. He called and spoke to one of our nurses and I approved doxycycline over the phone thinking that this was in relation to the wounds we had previously seen although they were definitely were not. The patient feels a lot better old fever no chills he is still working. Blood sugars are reasonably controlled 05/13/16; patient is back in for review of his cellulitis on his anterior medial upper thigh. He is taking doxycycline this is a lot better. Culture I did of the nodular area on the dorsal aspect of his foot grew MRSA this also looks a lot better. 05/20/16; the patient is cellulitis on the medial upper thigh has resolved. All of his wound areas including the left anterior foot, areas on the medial aspect of the left calf and the lateral aspect of the calf at  all resolved. He has a new blister on the left dorsal foot at the level of the fourth toe this was excised. No evidence of infection 05/27/16; patient continues to complain weeping edema. He has new blisterlike wounds on the left anterior lateral and posterior lateral calf at the top of his wrap levels. The area on his left anterior foot appears better. He is not complaining of fever, pain or pruritus in his feet. 05/30/16; the patient's blisters on his left anterior leg posterior calf all look improved. He did not increase the Lasix 100 mg as I suggested because he was going to run out of his 40 mg tablets. He is still having weeping edema of his toes 06/03/16; I renewed his Lasix at 80 mg once a day as he was about to run out when I last saw him. He is on 80 mg of Lasix now. I have asked him to cut down on the excessive amount of water he was drinking and asked him to drink according to his thirst mechanisms 06/12/2016 -- was seen 2 days ago and was supposed to wear his compression stockings at home but he is developed lymphedema and superficial blisters on the left lower extremity and hence came in for a review 06/24/16; the remaining wound is on his left anterior leg. He still has edema coming from between his toes. There is lymphedema here however his edema is generally better than when I last saw this. He has a history of atrial fibrillation but does not have a known history of congestive heart failure nevertheless I think he probably has this at least on a diastolic basis. 07/01/16 I reviewed his echocardiogram from January 2017. This was essentially normal. He did not have LVH, EF of 55-60%. His right ventricular function was normal although he did have trivial tricuspid and  pulmonic regurgitation. This is not audible on exam however. I increased his Lasix to do massive edema in his legs well above his knees I think in early July. He was also drinking an excessive amount of water at the  time. 07/15/16; missed his appointment last week because of the Labor Day holiday on Monday. He could not get another appointment later in the week. Started to feel the wrap digging in superiorly so we remove the top half and the bottom half of his wrap. He has extensive erythema and blistering superiorly in the left leg. Very tender. Very swollen. Edema in his foot with leaking edema fluid. He has not been systemically unwell 07/22/16; the area on the left leg laterally required some debridement. The medial wounds look more stable. His wrap injury wounds appear to have healed. Edema and his foot is better, weeping edema is also better. He tells me he is meeting with the supplier of the external compression pumps at work 08/05/16; the patient was on vacation last week in [facility]. His wrap is been on for an extended period of time. Also over the weekend he developed an extensive area of tender erythema across his anterior medial thigh. He took to doxycycline yesterday that he had leftover from a previous prescription. The patient complains of weeping edema coming out of his toes 08/08/16; I saw this patient on 10/2. He was tender across his anterior thigh. I put him on doxycycline. He returns today in follow-up. He does not have any open wounds on his lower leg, he still has edema weeping into his toes. 08/12/16; patient was seen back urgently today to follow-up for his extensive left thigh cellulitis/erysipelas. He comes back with a lot less swelling and erythema pain is much better. I believe I gave him Augmentin and Cipro. His wrap was cut down as he stated a roll down his legs. He developed blistering above the level of the wrap that remained. He has 2 open blisters and 1 intact. 08/19/16; patient is been doing his primary doctor who is increased his Lasix from 40-80 once a day or 80 already has less edema. Cellulitis has remained improved in the left thigh. 2 open areas on the posterior left  calf 08/26/16; he returns today having new open blisters on the anterior part of his left leg. He has his compression pumps but is not yet been shown how to use some vital representative from the supplier. 09/02/16 patient returns today with no open wounds on the left leg. Some maceration in his plantar toes 09/10/2016 -- Dr. Leanord Hawking had recently discharged him on 09/02/2016 and he has come right back with redness swelling and some open ulcers on his left lower extremity. He says this was caused by trying to apply his compression stockings and he's been unable to use this and has not been able to use his lymphedema pumps. He had some doxycycline leftover and he has started on this a few days ago. 09/16/16; there are no open wounds on his leg on the left and no evidence of cellulitis. 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 He does not complain of symptoms here. I am not clear use using his external compression pumps. 09/23/16; I have not seen this patient in 2 weeks. He canceled his appointment 10 days ago as he was going on vacation. He tells me that on Monday he noticed a large area on his posterior left leg which  is been draining copiously and is reopened into a large wound. He is been using ABDs and the external part of his juxtalite, according to our nurse this was not on properly. 10/07/16; Still a substantial area on the posterior left leg. Using silver alginate 10/14/16; in general better although there is still open area which looks healthy. Still using silver alginate. He reminds me that this happen before he left for [facility] T oday while he was showering in the morning. He had been using his juxtalite's 10/21/16; the area on his posterior left leg is fully epithelialized. However he arrives today with a large area of tender erythema in his medial and posterior left thigh just above the knee. I have marked the area. Once again  he is reluctant to consider hospitalization. I treated him with oral antibiotics in the past for a similar situation with resolution I think with doxycycline however this area it seems more extensive to me. He is not complaining of fever but does have chills and says states he is thirsty. His blood sugar today was in the 140s at home 10/25/16 the area on his posterior left leg is fully epithelialized although there is still some weeping edema. The large area of tenderness and erythema in his medial and posterior left thigh is a lot less tender although there is still a lot of swelling in this thigh. He states he feels a lot better. He is on doxycycline and Augmentin that I started last week. This will continued until Tuesday, December 26. I have ordered a duplex ultrasound of the left thigh rule out DVT whether there is an abscess something that would need to be drained I would also like to know. 11/01/16; he still has weeping edema from a not fully epithelialized area on his left posterior calf. Most of the rest of this looks a lot better. He has completed his antibiotics. His thigh is a lot better. Duplex ultrasound did not show a DVT in the thigh 11/08/16; he comes in today with more Denuded surface epithelium from the posterior aspect of his calf. There is no real evidence of cellulitis. The superior aspect of his wrap appears to have put quite an indentation in his leg just below the knee and this may have contributed. He does not complain of pain or fever. We have been using silver alginate as the primary dressing. The area of cellulitis in the right thigh has totally resolved. He has been using his compression stockings once a week 11/15/16; the patient arrives today with more loss of epithelium from the posterior aspect of his left calf. He now has a fairly substantial wound in this area. The reason behind this deterioration isn't exactly clear although his edema is not well controlled. He states  he feels he is generally more swollen systemically. He is not complaining of chest pain shortness of breath fever. T me he has an appointment with his primary physician in early February. He is on 80 mg of oral ells Lasix a day. He claims compliance with the external compression pumps. He is not having any pain in his legs similar to what he has with his recurrent cellulitis 11/22/16; the patient arrives a follow-up of his large area on his left lateral calf. This looks somewhat better today. He came in earlier in the week for a dressing change since I saw him a week ago. He is not complaining of any pain no shortness of breath no chest pain 11/28/16; the patient  arrives for follow-up of his large area on the left lateral calf this does not look better. In fact it is larger weeping edema. The surface of the wound does not look too bad. We have been using silver alginate although I'm not certain that this is a dressing issue. 12/05/16; again the patient follows up for a large wound on the left lateral and left posterior calf this does not look better. There continues to be weeping edema necrotic surface tissue. More worrisome than this once again there is erythema below the wound involving the distal Achilles and heel suggestive of cellulitis. He is on his feet working most of the day of this is not going well. We are changing his dressing twice a week to facilitate the drainage. 12/12/16; not much change in the overall dimensions of the large area on the left posterior calf. This is very inflamed looking. I gave him an. Doxycycline last week does not really seem to have helped. He found the wrap very painful indeed it seems to of dog into his legs superiorly and perhaps around the heel. He came in early today because the drainage had soaked through his dressings. 12/19/16- patient arrives for follow-up evaluation of his left lower extremity ulcers. He states that he is using his lymphedema pumps once daily  when there is "no drainage". He admits to not using his lipedema pumps while under current treatment. His blood sugars have been consistently between 150-200. 12/26/16; the patient is not using his compression pumps at home because of the wetness on his feet. I've advised him that I think it's important for him to use this daily. He finds his feet too wet, he can put a plastic bag over his legs while he is in the pumps. Otherwise I think will be in a vicious circle. We are using silver alginate to the major area on his left posterior calf 01/02/17; the patient's posterior left leg has further of all into 3 open wounds. All of them covered with a necrotic surface. He claims to be using his compression pumps once a day. His edema control is marginal. Continue with silver alginate 01/10/17; the patient's left posterior leg actually looks somewhat better. There is less edema, less erythema. Still has 3 open areas covered with a necrotic surface requiring debridement. He claims to be using his compression pumps once a day his edema control is better 01/17/17; the patient's left posterior calf look better last week when I saw him and his wrap was changed 2 days ago. He has noted increasing pain in the left heel and arrives today with much larger wounds extensive erythema extending down into the entire heel area especially tender medially. He is not systemically unwell CBGs have been controlled no fever. Our intake nurse showed me limegreen drainage on his AVD pads. 01/24/17; his usual this patient responds nicely to antibiotics last week giving him Levaquin for presumed Pseudomonas. The whole entire posterior part of his leg is much better much less inflamed and in the case of his Achilles heel area much less tender. He has also had some epithelialization posteriorly there are still open areas here and still draining but overall considerably better 01/31/17- He has continue to tolerate the compression wraps. he  states that he continues to use the lymphedema pumps daily, and can increase to twice daily on the weekends. He is voicing no complaints or concerns regarding his LLE ulcers 02/07/17-he is here for follow-up evaluation. He states that he noted some  erythema to the left medial and anterior thigh, which he states is new as of yesterday. He is concerned about recurrent cellulitis. He states his blood sugars have been slightly elevated, this morning in the 180s 02/14/17; he is here for follow-up evaluation. When he was last here there was erythema superiorly from his posterior wound in his anterior thigh. He was prescribed Levaquin however a culture of the wound surface grew MRSA over the phone I changed him to doxycycline on Monday and things seem to be a lot better. 02/24/17; patient missed his appointment on Friday therefore we changed his nurse visit into a physician visit today. Still using silver alginate on the large area of the posterior left thigh. He isn't new area on the dorsal left second toe 03/03/17; actually better today although he admits he has not used his external compression pumps in the last 2 days or so because of work responsibilities over the weekend. 03/10/17; continued improvement. External compression pumps once a day almost all of his wounds have closed on the posterior left calf. Better edema control 03/17/17; in general improved. He still has 3 small open areas on the lateral aspect of his left leg however most of the area on the posterior part of his leg is epithelialized. He has better edema control. He has an ABD pad under his stocking on the right anterior lower leg although he did not let us look at that today. 03/24/17; patient arrives back in clinic today with no open areas however there are areas on the posterior left calf and anterior left calf that are less than 100% epithelialized. His edema is well controlled in the left lower leg. There is some pitting edema probably  lymphedema in the left upper thigh. He uses compression pumps at home once per day. I tried to get him to do this twice a day although he is very reticent. 04/01/2017 -- for the last 2 days he's had significant redness, tenderness and weeping and came in for an urgent visit today. 04/07/17; patient still has 6 more days of doxycycline. He was seen by Dr. Meyer Russel last Wednesday for cellulitis involving the posterior aspect, lateral aspect of his Involving his heel. For the most part he is better there is less erythema and less weeping. He has been on his feet for 12 hours o2 over the weekend. Using his compression pumps once a day 04/14/17 arrives today with continued improvement. Only one area on the posterior left calf that is not fully epithelialized. He has intense bilateral venous inflammation associated with his chronic venous insufficiency disease and secondary lymphedema. We have been using silver alginate to the left posterior calf wound In passing he tells [place] today that the right leg but we have not seen in quite some time has an open area on it but he doesn't want [place] to look at this today states he will show this to [place] next week. 04/21/17; there is no open area on his left leg although he still reports some weeping edema. He showed [place] his right leg today which is the first time we've seen this leg in a long time. He has a large area of open wound on the right leg anteriorly healthy granulation. Quite a bit of swelling in the right leg and some degree of venous inflammation. He told us about the right leg in passing last week but states that deterioration in the right leg really only happened over the weekend 04/28/17; there is no open area  on the left leg although there is an irritated part on the posterior which is like a wrap injury. The wound on the right leg which was new from last week at least to [place] is a lot better. 05/05/17; still no open area on the left leg. Patient is using his new  compression stocking which seems to be doing a good job of controlling the edema. He states he is using his compression pumps once per day. The right leg still has an open wound although it is better in terms of surface area. Required debridement. A lot of pain in the posterior right Achilles marked tenderness. Usually this type of presentation this patient gives concern for an active cellulitis 05/12/17; patient arrives today with his major wound from last week on the right lateral leg somewhat better. Still requiring debridement. He was using his compression stocking on the left leg however that is reopened with superficial wounds anteriorly he did not have an open wound on this leg previously. He is still using his juxta light's once daily at night. He cannot find the time to do this in the morning as he has to be at work by 7 AM 05/19/17; right lateral leg wound looks improved. No debridement required. The concerning area is on the left posterior leg which appears to almost have a subcutaneous hemorrhagic component to it. We've been using silver alginate to all the wounds 05/26/17; the right lateral leg wound continues to look improved. However the area on the left posterior calf is a tightly adherent surface. [place] using silver alginate. Because of the weeping edema in his legs there is very little good alternatives. 06/02/17; the patient left here last week looking quite good. Major wound on the left posterior calf and a small one on the right lateral calf. Both of these look satisfactory. He tells me that by Wednesday he had noted increased pain in the left leg and drainage. He called on Thursday and Friday to get an appointment here but we were blocked. He did not go to urgent care or his primary physician. He thinks he had a fever on Thursday but did not actually take his temperature. He has not been using his compression pumps on the left leg because of pain. I advised him to go to the  emergency room today for IV antibiotics for stents of left leg cellulitis but he has refused I have asked him to take 2 days off work to keep his leg elevated and he has refused this as well. In view of this I'm going to call him and Augmentin and doxycycline. 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 However after a detailed discussion regarding the risk benefits and alternatives he refuses to get admitted to the hospital. With no other recourse I will continue him on oral antibiotics as before and hopefully he'll have his infectious disease consultation this week. 06/16/2017 -- the patient was seen today by the nurse practitioner at infectious disease Ms. Dixon. 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 He has also been advise  Lotrimin cream locally. she also had wise IV antibiotics for his condition if it flares up 06/23/17; patient arrives today with drainage bilaterally although the remaining wound on the left posterior calf after cleaning up today "highlighter yellow drainage" did not look too bad. Unfortunately he has had breakdown on the right anterior leg [previously this leg had not been open and he is using a black stocking] he went to see infectious disease and is been put on clindamycin 150 mg daily, I did not verify the dose although I'm not familiar with using clindamycin in this dosing range, perhaps for prophylaxisoo 06/27/17; I brought this patient back today to follow-up on the wound deterioration on the right lower leg together with surrounding cellulitis. I started  him on doxycycline 4 days ago. This area looks better however he comes in today with intense cellulitis on the medial part of his left thigh. This is not have a wound in this area. Extremely tender. We've been using silver alginate to the wounds on the right lower leg left lower leg with bilateral 4 layer compression he is using his external compression pumps once a day 07/04/17; patient's left medial thigh cellulitis looks better. He has not been using his compression pumps as his insert said it was contraindicated with cellulitis. His right leg continues to make improvements all the wounds are still open. We only have one remaining wound on the left posterior calf. Using silver alginate to all open areas. He is on doxycycline which I started a week ago and should be finishing I gave him Augmentin after Thursday's visit for the severe cellulitis on the left medial thigh which fortunately looks better 07/14/17; the patient's left medial thigh cellulitis has resolved. The cellulitis in his right lower calf on the right also looks better. All of his wounds are stable to improved we've been using silver alginate he has completed the antibiotics I have given him. He has clindamycin 150 mg once a day prescribed by infectious disease for prophylaxis, I've advised him to start this now. We have been using bilateral Unna boots over silver alginate to the wound areas 07/21/17; the patient is been to see infectious disease who noted his recurrent problems with cellulitis. He was not able to tolerate prophylactic clindamycin therefore he is on amoxicillin 500 twice a day. He also had a second daily dose of Lasix added By Dr. Oneta Rack but he is not taking this. Nor is he being completely compliant with his compression pumps a especially not this week. He has 2 remaining wounds one on the right posterior lateral lower leg and one on the left posterior medial lower leg. 07/28/17; maintain on Amoxil 500 twice a day as  prophylaxis for recurrent cellulitis as ordered by infectious disease. The patient has Unna boots bilaterally. 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 He has bilateral Unna boots for compression and silver alginate to his wounds. Arrives today with his legs looking as good as I have seen him in quite some time. Not surprisingly his wounds look better as well with improvement on the right lateral leg venous insufficiency wound and also the left medial leg. He is still using the compression pumps once a day 08/11/17; both legs appear to be doing better wounds on the right lateral and left medial legs look better. Skin on the right leg  quite good. He is been using silver alginate as the primary dressing. I'm going to use Anasept gel calcium alginate and maintain all the secondary dressings 08/18/17; the patient continues to actually do quite well. The area on his right lateral leg is just about closed the left medial also looks better although it is still moist in this area. His edema is well controlled we have been using Anasept gel with calcium alginate and the usual secondary dressings, 4 layer compression and once daily use of his compression pumps "always been able to manage 09/01/17; the patient continues to do reasonably well in spite of his trip to [place]. The area on the right lateral leg is epithelialized. Left is much better but still open. He has more edema and more chronic erythema on the left leg [venous inflammation] 09/08/17; he arrives today with no open wound on the right lateral leg and decently controlled edema. Unfortunately his left leg is not nearly as in his good situation as last week.he apparently had increasing edema starting on Saturday. He edema soaked through into his foot so used a plastic bag to walk around his home. The  area on the medial right leg which was his open area is about the same however he has lost surface epithelium on the left lateral which is new and he has significant pain in the Achilles area of the left foot. He is already on amoxicillin chronically for prophylaxis of cellulitis in the left leg 09/15/17; he is completed a week of doxycycline and the cellulitis in the left posterior leg and Achilles area is as usual improved. He still has a lot of edema and fluid soaking through his dressings. There is no open wound on the right leg. He saw infectious disease NP today 09/22/17;As usual 1 we transition him from our compression wraps to his stockings things did not go well. He has several small open areas on the right leg. He states this was caused by the compression wrap on his skin although he did not wear this with the stockings over them. He has several superficial areas on the left leg medially laterally posteriorly. He does not have any evidence of active cellulitis especially involving the left Achilles The patient is traveling from [facility] Saturday going to [facility]. He states he isn't attempting to get an appointment with a heel objects wound center there to change his dressings. I am not completely certain whether this will work 10/06/17; the patient came in on Friday for a nurse visit and the nurse reported that his legs actually look quite good. He arrives in clinic today for his regular follow-up visit. He has a new wound on his left third toe over the PIP probably caused by friction with his footwear. He has small areas on the left leg and a very superficial but epithelialized area on the right anterior lateral lower leg. Other than that his legs look as good as I've seen him in quite some time. We have been using silver alginate Review of systems; no chest pain no shortness of breath other than this a 10 point review of systems negative 10/20/17; seen by Dr. Meyer Russel last week. He had  taken some antibiotics [doxycycline] that he had left over. Dr. Meyer Russel thought he had candida infection and declined to give him further antibiotics. He has a small wound remaining on the right lateral leg several areas on the left leg including a larger area on the left posterior several left medial and anterior  and a small wound on the left lateral. The area on the left dorsal third toe looks a lot better. ROS; Gen.; no fever, respiratory no cough no sputum Cardiac no chest pain other than this 10 point review of system is negative 10/30/17; patient arrives today having fallen in the bathtub 3 days ago. It took him a while to get up. He has pain and maceration in the wounds on his left leg which have deteriorated. He has not been using his pumps he also has some maceration on the right lateral leg. 11/03/17; patient continues to have weeping edema especially in the left leg. This saturates his dressings which were just put on on 12/27. As usual the doxycycline seems to take care of the cellulitis on his lower leg. He is not complaining of fever, chills, or other systemic symptoms. He states his leg feels a lot better on the doxycycline I gave him empirically. He also apparently gets injections at his primary doctor's officeo Rocephin for cellulitis prophylaxis. I didn't ask him about his compression pump compliance today I think that's probably marginal. Arrives in the clinic with all of his dressings primary and secondary macerated full of fluid and he has bilateral edema 11/10/17; the patient's right leg looks some better although there is still a cluster of wounds on the right lateral. The left leg is inflamed with almost circumferential skin loss medially to laterally although we are still maintaining anteriorly. He does not have overt cellulitis there is a lot of drainage. He is not using compression pumps. We have been using silver alginate to the wound areas, there are not a lot of options  here 11/17/17; the patient's right leg continues to be stable although there is still open wounds, better than last week. The inflammation in the left leg is better. Still loss of surface layer epithelium especially posteriorly. There is no overt cellulitis in the amount of edema and his left leg is really quite good, tells me he is using his compression pumps once a day. 11/24/17; patient's right leg has a small superficial wound laterally this continues to improve. The inflammation in the left leg is still improving however we have continuous surface layer epithelial loss posteriorly. There is no overt cellulitis in the amount of edema in both legs is really quite good. He states he is using his compression pumps on the left leg once a day for 5 out of 7 days 12/01/17; very small superficial areas on the right lateral leg continue to improve. Edema control in both legs is better today. He has continued loss of surface epithelialization and left posterior calf although I think this is better. We have been using silver alginate with large number of absorptive secondary dressings 4 layer on the left Unna boot on the right at his request. He tells me he is using his compression pumps once a day 12/08/17; he has no open area on the right leg is edema control is good here. ooOn the left leg however he has marked erythema and tenderness breakdown of skin. He has what appears to be a wrap injury just distal to the popliteal fossa. 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 There was swelling erythema and breakdown  in the right leg. He has much worse skin breakdown in the left leg as well multiple open areas medially and posteriorly as  well as laterally. He tells me he has been using his compression pumps but tells me he feels that the drainage out of his leg is worse when he uses a compression pumps. T be fair to him he is been saying this o for a while however I don't know that I have really been listening to this. I wonder if the compression pumps are working properly 12/22/17;. Once again he arrives with severe erythema, weeping edema from the left greater than right leg. Noncompliance with compression pumps. New this visit he is complaining of pain on the lateral aspect of the right leg and the medial aspect of his right thigh. He apparently saw his cardiologist Dr. [name] who was ordered an echocardiogram area and I think this is a step in the right direction 12/25/17; started his doxycycline Monday night. There is still intense erythema of the right leg especially in the anterior thigh although there is less tenderness. The erythema around the wound on the right lateral calf also is less tender. He still complaining of pain in the left heel. His wounds are about the same right lateral left medial left lateral. Superficial but certainly not close to closure. He denies being systemically unwell no fever chills no abdominal pain no diarrhea 12/29/17; back in follow-up of his extensive right calf and right thigh cellulitis. I added amoxicillin to cover possible doxycycline resistant strep. This seems to of done the trick he is in much less pain there is much less erythema and swelling. He has his echocardiogram at 11:00 this morning. X-ray of the left heel was also negative. 01/05/18; the patient arrived with his edema under much better control. Now that he is retired he is able to use his compression pumps daily and sometimes twice a day per the patient. He has a wound on the right leg the lateral wound looks better. Area on the left leg also looks a lot better. He has no evidence of cellulitis in his bilateral thighs I had a quick  peak at his echocardiogram. He is in normal ejection fraction and normal left ventricular function. He has moderate pulmonary hypertension moderately reduced right ventricular function. One would have to wonder about chronic sleep apnea although he says he doesn't snore. He'll review the echocardiogram with his cardiologist. 01/12/18; the patient arrives with the edema in both legs under exemplary control. He is using his compression pumps daily and sometimes twice daily. His wound on the right lateral leg is just about closed. He still has some weeping areas on the posterior left calf and lateral left calf although everything is just about closed here as well. I have spoken with Aldean Baker who is the patient's nurse practitioner and infectious disease. She was concerned that the patient had not understood that the parenteral penicillin injections he was receiving for cellulitis prophylaxis was actually benefiting him. I don't think the patient actually saw that I would tend to agree we were certainly dealing with less infections although he had a serious one last month. 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 He is healed. He'll be placed in juxtalite compression wraps and increase his lymphedema pumps to twice daily. We will follow up again next week to ensure there are no issues with the new regiment. 01/20/18-he is here for evaluation of bilateral lower extremity weeping  edema. Yesterday he was placed in compression wrap to the right lower extremity and compression stocking to left lower shrubbery. He states he uses lymphedema pumps last night and again this morning and noted a blister to the left lower extremity. On exam he was noted to have drainage to the right lower extremity. He will be placed in Unna boots bilaterally and follow-up next week 01/26/18; patient was actually discharged a week ago to his own juxta light stockings only to return the next  day with bilateral lower extremity weeping edema.he was placed in bilateral Unna boots. He arrives today with pain in the back of his left leg. There is no open area on the right leg however there is a linear/wrap injury on the left leg and weeping edema on the left leg posteriorly. I spoke with infectious disease about 10 days ago. They were disappointed that the patient elected to discontinue prophylactic intramuscular penicillin shots as they felt it was particularly beneficial in reducing the frequency of his cellulitis. I discussed this with the patient today. He does not share this view. He'll definitely need antibiotics today. Finally he is traveling to [place] and trauma leaving this Saturday and returning a week later and he does not travel with his pumps. He is going by car 01/30/18; patient was seen 4 days ago and brought back in today for review of cellulitis in the left leg posteriorly. I put him on amoxicillin this really hasn't helped as much as I might like. He is also worried because he is traveling to [facility] trauma by car. Finally we will be rewrapping him. There is no open area on the right leg over his left leg has multiple weeping areas as usual 02/09/18; The same wrap on for 10 days. He did not pick up the last doxycycline I prescribed for him. He apparently took 4 days worth he already had. There is nothing open on his right leg and the edema control is really quite good. He's had damage in the left leg medially and laterally especially probably related to the prolonged use of Unna boots 02/12/18; the patient arrived in clinic today for a nurse visit/wrap change. He complained of a lot of pain in the left posterior calf. He is taking doxycycline that I previously prescribed for him. Unfortunately even though he used his stockings and apparently used to compression pumps twice a day he has weeping edema coming out of the lateral part of his right leg. This is coming from the  lower anterior lateral skin area. 02/16/18; the patient has finished his doxycycline and will finish the amoxicillin 2 days. The area of cellulitis in the left calf posteriorly has resolved. He is no longer having any pain. He tells me he is using his compression pumps at least once a day sometimes twice. 02/23/18; the patient finished his doxycycline and Amoxil last week. On Friday he noticed a small erythematous circle about the size of a quarter on the left lower leg just above his ankle. This rapidly expanded and he now has erythema on the lateral and posterior part of the thigh. This is bright red. Also has an area on the dorsal foot just above his toes and a tender area just below the left popliteal fossa. He came off his prophylactic penicillin injections at his own insistence one or 2 months ago. This is obviously deteriorated since then 03/02/18; patient is on doxycycline and Amoxil. Culture I did last week of the weeping area on the back  of his left calf grew group B strep. I have therefore renewed the amoxicillin 500 3 times a day for a further week. He has not been systemically unwell. Still complaining of an area of discomfort right under his left popliteal fossa. There is no open wound on the right leg. He tells me that he is using his pumps twice a day on most days 03/09/18; patient arrives in clinic today completing his amoxicillin today. The cellulitis on his left leg is better. Furthermore he tells me that he had intramuscular penicillin shots that his primary care office today. However he also states that the wrap on his right leg fell down shortly after leaving clinic last week. He developed a large blister that was present when he came in for a nurse visit later in the week and then he developed intense discomfort around this area.He tells me he is using his compression pumps 03/16/18; the patient has completed his doxycycline. The infectious part of this/cellulitis in the left heel area  left popliteal area is a lot better. He has 2 open areas on the right calf. Still areas on the left calf but this is a lot better as well. 03/24/18; the patient arrives complaining of pain in the left popliteal area again. He thinks some of this is wrap injury. He has no open area on the right leg and really no open area on the left calf either except for the popliteal area. He claims to be compliant with the compression pumps 03/31/18; I gave him doxycycline last week because of cellulitis in the left popliteal area. This is a lot better although the surface epithelium is denuded off and response to this. He arrives today with uncontrolled edema in the right calf area as well as a fingernail injury in the right lateral calf. There is only a few open areas on the left 04/06/18; I gave him amoxicillin doxycycline over the last 2 weeks that the amoxicillin should be completing currently. He is not complaining of any pain or systemic symptoms. The only open areas see has is on the right lateral lower leg paradoxically I cannot see anything on the left lower leg. He tells me he is using his compression pumps twice a day on most days. Silver alginate to the wounds that are open under 4 layer compression 04/13/18; he completed antibiotics and has no new complaints. Using his compression pumps. Silver alginate that anything that's opened 04/20/18; he is using his compression pumps religiously. Silver alginate 4 layer compression anything that's opened. He comes in today with no open wounds on the left leg but 3 on the right including a new one posteriorly. He has 2 on the right lateral and one on the right posterior. He likes Unna boots on the right leg for reasons that aren't really clear we had the usual 4 layer compression on the left. It may be necessary to move to the 4 layer compression on the right however for now I left them in the Unna boots 04/27/18; he is using his compression pumps at least once a day.  He has still the wounds on the right lateral calf. The area right posteriorly has closed. He does not have an open wound on the left under 4 layer compression however on the dorsal left foot just proximal to the toes and the left third toe 2 small open areas were identified 05/11/18; he has not uses compression pumps. The areas on the right lateral calf have coalesced into one  large wound necrotic surface. On the left side he has one small wound anteriorly however the edema is now weeping out of a large part of his left leg. He says he wasn't using his pumps because of the weeping fluid. I explained to him that this is the time he needs to pump more 05/18/18; patient states he is using his compression pumps twice a day. The area on the right lateral large wound albeit superficial. On the left side he has innumerable number of small new wounds on the left calf particularly laterally but several anteriorly and medially. All these appear to have healthy granulated base these look like the remnants of blisters however they occurred under compression. The patient arrives in clinic today with his legs somewhat better. There is certainly less edema, less multiple open areas on the left calf and the right anterior leg looks somewhat better as well superficial and a little smaller. However he relates pain and erythema over the last 3-4 days in the thigh and I looked at this today. He has not been systemically unwell no fever no chills no change in blood sugar values 05/25/18; comes in today in a better state. The severe cellulitis on his left leg seems better with the Keflex. Not as tender. He has not been systemically unwell ooHard to find an open wound on the left lower leg using his compression pumps twice a day ooThe confluent wounds on his right lateral calf somewhat better looking. These will ultimately need debridement I didn't do this today. 06/01/18; the severe cellulitis on the left anterior thigh has  resolved and he is completed his Keflex. ooThere is no open wound on the left leg however there is a superficial excoriation at the base of the third toe dorsally. Skin on the bottom of his left foot is macerated looking. ooThe left the wounds on the lateral right leg actually looks some better although he did require debridement of the top half of this wound area with an open curet 06/09/18 on evaluation today patient appears to be doing poorly in regard to his right lower extremity in particular this appears to likely be infected he has very thick purulent discharge along with a bright green tent to the discharge. This makes me concerned about the possibility of pseudomonas. He's also having increased discomfort at this point on evaluation. Fortunately there does not appear to be any evidence of infection spreading to the other location at this time. 06/16/18 on evaluation today patient appears to actually be doing fairly well. His ulcer has actually diminished in size quite significantly at this point which is good news. Nonetheless he still does have some evidence of infection he did see infectious disease this morning before coming here for his appointment. I did review the results of their evaluation and their note today. They did actually have him discontinue the Cipro and initiate treatment with linezolid at this time. He is doing this for the next seven days and they recommended a follow-up in four months with them. He is the keep a log of the need for intermittent antibiotic therapy between now and when he falls back up with infectious disease. This will help them gaze what exactly they need to do to try and help them out. 06/23/18; the patient arrives today with no open wounds on the left leg and left third toe healed. He is been using his compression pumps twice a day. On the right lateral leg he still has a sizable wound  but this is a lot better than last time I saw this. In my absence he  apparently cultured MRSA coming from this wound and is completed a course of linezolid as has been directed by infectious disease. Has been using silver alginate under 4 layer compression 06/30/18; the only open wound he has is on the right lateral leg and this looks healthy. No debridement is required. We have been using silver alginate. He does not have an open wound on the left leg. There is apparently some drainage from the dorsal proximal third toe on the left although I see no open wound here. 07/03/18 on evaluation today patient was actually here just for a nurse visit rapid change. However when he was here on Wednesday for his rat change due to having been healed on the left and then developing blisters we initiated the wrap again knowing that he would be back today for [place] to reevaluate and see were at. Unfortunately he has developed some cellulitis into the proximal portion of his right lower extremity even into the region of his thigh. 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 He was placed on one as what at that point. Nonetheless he is done with that and has been tolerating it well otherwise. Doxycycline which in the past really did not seem to be effective for him. Nonetheless I think the best option may be for [place] to definitely reinitiate the antibiotics for a longer period of time. 07/07/18; since I last saw this patient a week ago he has had a difficult time. At that point he did not have an open wound on his left leg. We transitioned him into juxta light stockings. He was apparently in the clinic the next day with blisters on the left lateral and left medial lower calf. He also had weeping edema fluid. He was put back into a compression wrap. He was also in the clinic on Friday with intense erythema in his right thigh. Per the patient he was started on Bactrim however that didn't work at all in terms of relieving his pain and swelling. He has taken  3 doxycycline that he had left over from last time and that seems to of helped. He has blistering on the right thigh as well. 07/14/18; the erythema on his right thigh has gotten better with doxycycline that he is finishing. 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 Presumably this cellulitis in the thigh was not related to that although I think this is a bit concerning going forward. He still has an area on the right lateral calf the blister on the right medial calf just below the knee that was discussed above. On the left 2 small open areas left medial and left lateral. Edema control is adequate. He is using his compression pumps twice a day 07/20/18; continued improvement in the condition of both legs especially the edema in his bilateral thighs. He tells me he is been losing weight through a combination of diet and exercise. He is using his compression pumps twice a day. So overall she made to the remaining wounds 07/27/2018; continued improvement in condition of both legs. His edema is well controlled. The area on the right lateral leg is just about closed he had one blisters show up on the medial left upper calf. We have him in 4 layer compression. He is going  on a 10-day trip to [place], [place] and [place]. He will be driving. He wants to wear Unna boots because of the lessening amount of constriction. He will not use compression pumps while he is away 08/05/18 on evaluation today patient actually appears to be doing decently well all things considered in regard to his bilateral lower extremities. The worst ulcer is actually only posterior aspect of his left lower extremity with a four layer compression wrap cut into his leg a couple weeks back. He did have a trip and actually had [employer] for the trip that he is worn since he was last here. Nonetheless he feels like the [employer] actually do better for  him his swelling is up a little bit but he also with his trip was not taking his Lasix on a regular set schedule like he was supposed to be. He states that obviously the reason being that he cannot drive and keep going without having to urinate too frequently which makes it difficult. He did not have his pumps with him while he was away either which I think also maybe playing a role here too. 08/13/2018; the patient only has a small open wound on the right lateral calf which is a big improvement in the last month or 2. He also has the area posteriorly just below the posterior fossa on the left which I think was a wrap injury from several weeks ago. He has no current evidence of cellulitis. He tells me he is back into his compression pumps twice a day. He also tells me that while he was at the laundromat somebody stole a section of his extremitease stockings 08/20/2018; back in the clinic with a much improved state. He only has small areas on the right lateral mid calf which is just about healed. This was is more substantial area for quite a prolonged period of time. He has a small open area on the left anterior tibia. The area on the posterior calf just below the popliteal fossa is closed today. He is using his compression pumps twice a day 08/28/2018; patient has no open wound on the right leg. He has a smattering of open areas on the calf with some weeping lymphedema. More problematically than that it looks as though his wraps of slipped down in his usual he has very angry upper area of edema just below the right medial knee and on the right lateral calf. He has no open area on his feet. The patient is traveling to [facility] next week. I will send him in an antibiotic. We will continue to wrap the right leg. We ordered extremitease stockings for him last week and I plan to transition the right leg to a stocking when he gets home which will be in 10 days time. As usual he is very reluctant to take  his pumps with him when he travels 09/07/2018; patient returns from [facility]. He shows me a picture of his left leg in the mid part of his trip last week with intense fire engine erythema. The picture look bad enough I would have considered sending him to the hospital. Instead he went to the wound care center in [facility]. They did not prescribe him antibiotics but he did take some doxycycline he had leftover from a previous visit. I had given him trimethoprim sulfamethoxazole before he left this did not work according to the patient. This is resulted in some improvement fortunately. He comes  back with a large wound on the left posterior calf. Smaller area on the left anterior tibia. Denuded blisters on the dorsal left foot over his toes. Does not have much in the way of wounds on the right leg although he does have a very tender area on the right posterior area just below the popliteal fossa also suggestive of infection. He promises me he is back on his pumps twice a day 09/15/2018; the intense cellulitis in his left lower calf is a lot better. The wound area on the posterior left calf is also so better. However he has reasonably extensive wounds on the dorsal aspect of his second and third toes and the proximal foot just at the base of the toes. There is nothing open on the right leg 09/22/2018; the patient has excellent edema control in his legs bilaterally. He is using his external compression pumps twice a day. He has no open area on the right leg and only the areas in the left foot dorsally second and third toe area on the left side. He does not have any signs of active cellulitis. 10/06/2018; the patient has good edema control bilaterally. He has no open wound on the right leg. There is a blister in the posterior aspect of his left calf that we had to deal with today. He is using his compression pumps twice a day. There is no signs of active cellulitis. We have been using silver  alginate to the wound areas. He still has vulnerable areas on the base of his left first second toes dorsally He has a his extremities stockings and we are going to transition him today into the stocking on the right leg. He is cautioned that he will need to continue to use the compression pumps twice a day. If he notices uncontrolled edema in the right leg he may need to go to 3 times a day. 10/13/2018; the patient came in for a nurse check on Friday he has a large flaccid blister on the right medial calf just below the knee. We unroofed this. He has this and a new area underneath the posterior mid calf which was undoubtedly a blister as well. He also has several small areas on the right which is the area we put his extremities stocking on. 10/19/2018; the patient went to see infectious disease this morning I am not sure if that was a routine follow-up in any case the doxycycline I had given him was discontinued and started on linezolid. He has not started this. It is easy to look at his left calf and the inflammation and think this is cellulitis however he is very tender in the tissue just below the popliteal fossa and I have no doubt that there is infection going on here. He states the problem he is having is that with the compression pumps the edema goes down and then starts walking the wrap falls down. We will see if we can adhere this. He has 1 or 2 minuscule open areas on the right still areas that are weeping on the posterior left calf, the base of his left second and third toes 10/26/18; back today in clinic with quite of skin breakdown in his left anterior leg. This may have been infection the area below the popliteal fossa seems a lot better however tremendous epithelial loss on the left anterior mid tibia area over quite inexpensive tissue. He has 2 blisters on the right side but no other open wound here. 10/29/2018; came in  urgently to see [place] today and we worked him in for review. He  states that the 4 layer compression on the right leg caused pain he had to cut it down to roughly his mid calf this caused swelling above the wrap and he has blisters and skin breakdown today. As a result of the pain he has not been using his pumps. Both legs are a lot more edematous and there is a lot of weeping fluid. 11/02/18; arrives in clinic with continued difficulties in the right leg> left. Leg is swollen and painful. multiple skin blisters and new open areas especially laterally. He has not been using his pumps on the right leg. 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 He is not systemically unwell. 11/09/2018; the patient claims he is being compliant with his pumps. He is finished the doxycycline I gave him last week. Culture I did of the wound on the right lateral leg showed a few very resistant methicillin staph aureus. This was resistant to doxycycline. Nevertheless he states the pain in the leg is a lot better which makes me wonder if the cultured organism was not really what was causing the problem nevertheless this is a very dangerous organism to be culturing out of any wound. His right leg is still a lot larger than the left. He is using an Radio broadcast assistant on this area, he blames a 4-layer compression for causing the original skin breakdown which I doubt is true however I cannot talk him out of it. We have been using silver alginate to all of these areas which were initially blisters 11/16/2018; patient is being compliant with his external compression pumps at twice a day. Miraculously he arrives in clinic today with absolutely no open wounds. He has better edema control on the left where he has been using 4 layer compression versus wound of wounds on the right and I pointed this out to him. There is no inflammation in the skin in his lower legs which is also somewhat unusual for him. There is no open wounds on the dorsal left foot. He  has extremitease stockings at home and I have asked him to bring these in next week. 11/25/18 patient's lower extremity on examination today on the left appears for the most part to be wound free. He does have an open wound on the lateral aspect of the right lower extremity but this is minimal compared to what I've seen in past. He does request that we go ahead and wrap the left leg as well even though there's nothing open just so hopefully it will not reopen in short order. 1/28; patient has superficial open wounds on the right lateral calf left anterior calf and left posterior calf. His edema control is adequate. 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 We have been using silver alginate as the primary dressing. He claims compliance with his compression pumps 2/4; patient has superficial open wounds on numerous areas of his left calf and again one on the left dorsal foot. The areas on the right lateral calf have healed. The cellulitis that I gave him doxycycline for last week is also resolved this was mostly on the left anterior calf just below the tibial tuberosity. His edema looks fairly well-controlled. He tells me he went to see his primary doctor today and had blood work ordered 2/11; once again he has  several open areas on the left calf left tibial area. Most of these are small and appear to have healthy granulation. He does not have anything open on the right. The edema and control in his thighs is pretty good which is usually a good indication he has been using his pumps as requested. 2/18; he continues to have several small areas on the left calf and left tibial area. Most of these are small healthy granulation. We put him in his stocking on the right leg last week and he arrives with a superficial open area over the right upper tibia and a fairly large area on the right lateral tibia in similar  condition. His edema control actually does not look too bad, he claims to be using his compression pumps twice a day 2/25. Continued small areas on the left calf and left tibial area. New areas especially on the right are identified just below the tibial tuberosity and on the right upper tibia itself. There are also areas of weeping edema fluid even without an obvious wound. He does not have a considerable degree of lymphedema but clearly there is more edema here than his skin can handle. He states he is using the pumps twice a day. We have an Unna boot on the right and 4 layer compression on the left. 3/3; he continues to have an area on the right lateral calf and right posterior calf just below the popliteal fossa. There is a fair amount of tenderness around the wound on the popliteal fossa but I did not see any evidence of cellulitis, could just be that the wrap came down and rubbed in this area. ooHe does not have an open area on the left leg however there is an area on the left dorsal foot at the base of the third toe ooWe have been using silver alginate to all wound areas 3/10; he did not have an open area on his left leg last time he was here a week ago. T oday he arrives with a horizontal wound just below the tibial tuberosity and an area on the left lateral calf. He has intense erythema and tenderness in this area. The area is on the right lateral calf and right posterior calf better than last week. We have been using silver alginate as usual 3/18 - Patient returns with 3 small open areas on left calf, and 1 small open area on right calf, the skin looks ok with no significant erythema, he continues the UNA boot on right and 4 layer compression on left. The right lateral calf wound is closed , the right posterior is small area. we will continue silver alginate to the areas. 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 He is been tolerating the dressing changes without complication. Fortunately she has made excellent improvement in regard to the overall status of his wounds. Unfortunately every time we cease wrapping him he ends up reopening in causing more significant issues at that point. Again I'm unsure of the best direction to take although I think the lymphedema clinic may be appropriate for him. 02/03/19 on evaluation today patient appears to be doing well in regard to the wounds that we saw him for last week unfortunately he has a new area on the proximal portion of his right medial/posterior lower extremity  where the wrap somewhat slowed down and caused swelling and a blister to rub and open. Unfortunately this is the only opening that he has on either leg at this point. 02/17/19 on evaluation today patient's bilateral lower extremities appear to be doing well. He still completely healed in regard to the left lower extremity. In regard to the right lower extremity the area where the wrap and slid down and caused the blister still seems to be slightly open although this is dramatically better than during the last evaluation two weeks ago. I'm very pleased with the way this stands overall. 03/03/19 on evaluation today patient appears to be doing well in regard to his right lower extremity in general although he did have a new blister open this does not appear to be showing any evidence of active infection at this time. Fortunately there's No fevers, chills, nausea, or vomiting noted at this time. Overall I feel like he is making good progress it does feel like that the right leg will we perform the [employer] seems to do with a bit better than three layer wrap on the left which slid down on him. We may switch to doing bilateral in the book wraps. 5/4; I have not seen Mr. Meckes in quite  some time. According to our case manager he did not have an open wound on his left leg last week. He had 1 remaining wound on the right posterior medial calf. He arrives today with multiple openings on the left leg probably were blisters and/or wrap injuries from Unna boots. I do not think the Unna boot's will provide adequate compression on the left. I am also not clear about the frequency he is using the compression pumps. 03/17/19 on evaluation today patient appears to be doing excellent in regard to his lower extremities compared to last week's evaluation apparently. He had gotten significantly worse last week which is unfortunate. The [employer] wrap on the left did not seem to do very well for him at all and in fact it didn't control his swelling significantly enough he had an additional outbreak. Subsequently we go back to the four layer compression wrap on the left. This is good news. At least in that he is doing better and the wound seem to be killing him. He still has not heard anything from the lymphedema clinic. 03/24/19 on evaluation today patient actually appears to be doing much better in regard to his bilateral lower Trinity as compared to last week when I saw him. Fortunately there's no signs of active infection at this time. He has been tolerating the dressing changes without complication. Overall I'm extremely pleased with the progress and appearance in general. 04/07/19 on evaluation today patient appears to be doing well in regard to his bilateral lower extremities. His swelling is significantly down from where it was previous. With that being said he does have a couple blisters still open at this point but fortunately nothing that seems to be too severe and again the majority of the larger openings has healed at this time. 04/14/19 on evaluation today patient actually appears to be doing quite well in regard to his bilateral lower extremities in fact I'm not even sure there's  anything significantly open at this time at any site. Nonetheless he did have some trouble with these wraps where they are somewhat irritating him secondary to the fact that he has noted that the graph wasn't too close down to the end of this foot in a  little bit short as well up to his knee. Otherwise things seem to be doing quite well. 04/21/19 upon evaluation today patient's wound bed actually showed evidence of being completely healed in regard to both lower extremities which is excellent news. There does not appear to be any signs of active infection which is also good news. I'm very pleased in this regard. No fevers, chills, nausea, or vomiting noted at this time. 04/28/19 on evaluation today patient appears to be doing a little bit worse in regard to both lower extremities on the left mainly due to the fact that when he went infection disease the wrap was not wrapped quite high enough he developed a blister above this. On the right he is a small open area of nothing too significant but again this is continuing to give him some trouble he has been were in the Velcro compression that he has at home. 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 He's been tolerating the dressing changes without complication. Fortunately there's no signs of active infection at this time. No fevers, chills, nausea, or vomiting noted at this time. We have been trying to get an appointment with her lymphedema clinic in [facility] but unfortunately nobody can get them on phone with not been able to even fax information over the patient likewise is not been able to get in touch with them. Overall I'm not sure exactly what's going on here with to reach out again today. 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 Still having a lot of drainage unfortunately. He tells  me especially in the left but even on the right. There's no signs of active infection which is good news we've been using so ratcheted up to this point. 05/19/19 on evaluation today patient actually appears to be doing quite well with regard to his left lower extremity which is great news. Fortunately in regard to the right lower extremity has an issues with his wrap and he subsequently did remove this from what I'm understanding. Nonetheless long story short is what he had rewrapped once he removed it subsequently had maggots underneath this wrap whenever he came in for evaluation today. With that being said they were obviously completely cleaned away by the nursing staff. The visit today which is excellent news. However he does appear to potentially have some infection around the right ankle region where the maggots were located as well. He will likely require anabiotic therapy today. 05/26/19 on evaluation today patient actually appears to be doing much better in regard to his bilateral lower extremities. I feel like the infection is under much better control. With that being said there were maggots noted when the wrap was removed yet again today. Again this could have potentially been left over from previous although at this time there does not appear to be any signs of significant drainage there was obviously on the wrap some drainage as well this contracted gnats or otherwise. Either way I do not see anything that appears to be doing worse in my pinion and in fact I think his drainage has slowed down quite significantly likely mainly due to the fact to his infection being under better control. 06/02/2019 on evaluation today patient actually appears to be doing well with regard to his bilateral lower extremities there is no signs of active infection at this time which is  great news. With that being said he does have several open areas more so on the right than the left but nonetheless these are all  significantly better than previously noted. 06/09/2019 on evaluation today patient actually appears to be doing well. His wrap stayed up and he did not cause any problems he had more drainage on the right compared to the left but overall I do not see any major issues at this time which is great news. 06/16/2019 on evaluation today patient appears to be doing excellent with regard to his lower extremities the only area that is open is a new blister that can have opened as of today on the medial ankle on the left. Other than this he really seems to be doing great I see no major issues at this point. 06/23/2019 on evaluation today patient appears to be doing quite well with regard to his bilateral lower extremities. In fact he actually appears to be almost completely healed there is a small area of weeping noted of the right lower extremity just above the ankle. Nonetheless fortunately there is no signs of active infection at this time which is good news. No fevers, chills, nausea, vomiting, or diarrhea. 8/24; the patient arrived for a nurse visit today but complained of very significant pain in the left leg and therefore I was asked to look at this. Noted that he did not have an open area on the left leg last week nevertheless this was wrapped. The patient states that he is not been able to put his compression pumps on the left leg because of the discomfort. He has not been systemically unwell 06/30/2019 on evaluation today patient unfortunately despite being excellent last week is doing much worse with regard to his left lower extremity today. In fact he had to come in for a nurse on Monday where his left leg had to be rewrapped due to excessive weeping Dr. Leanord Hawking placed him on doxycycline at that point. Fortunately there is no signs of active infection Systemically at this time which is good news. 07/07/2019 in regard to the patient's wounds today he actually seems to be doing well with his right lower  extremity there really is nothing open or draining at this point this is great news. Unfortunately the left lower extremity is given him additional trouble at this time. There does not appear to be any signs of active infection nonetheless he does have a lot of edema and swelling noted at this point as well as blistering all of which has led to a much more poor appearing leg at this time compared to where it was 2 weeks ago when it was almost completely healed. Obviously this is a little discouraging for the patient. He is try to contact the lymphedema clinic in [place] he has not been able to get through to them. 07/14/2019 on evaluation today patient actually appears to be doing slightly better with regard to his left lower extremity ulcers. Overall I do feel like at least at the top of the wrap that we have been placing this area has healed quite nicely and looks much better. The remainder of the leg is showing signs of improvement. Unfortunately in the thigh area he still has an open region on the left and again on the right he has been utilizing just a Band-Aid on an area that also opened on the thigh. Again this is an area that were not able to wrap although we did do an Ace wrap to  provide some compression that something that obviously is a little less effective than the compression wraps we have been using on the lower portion of the leg. He does have an appointment with the lymphedema clinic in [facility] on Friday. 07/21/2019 on evaluation today patient appears to be doing better with regard to his lower extremity ulcers. He has been tolerating the dressing changes without complication. Fortunately there is no signs of active infection at this time. No fevers, chills, nausea, vomiting, or diarrhea. I did receive the paperwork from the physical therapist at the lymphedema clinic in [place]. Subsequently I signed off on that this morning and sent that back to him for  further progression with the treatment plan. 07/28/2019 on evaluation today patient appears to be doing very well with regard to his right lower extremity where I do not see any open wounds at this point. Fortunately he is feeling great as far as that is concerned as well. In regard to the left lower extremity he has been having issues with still several areas of weeping and edema although the upper leg is doing better his lower leg still I think is going require the compression wrap at this time. No fevers, chills, nausea, vomiting, or diarrhea. 08/04/2019 on evaluation today patient unfortunately is having new wounds on the right lower extremity. Again we have been using Unna boot wrap on that side. We switched him to using his juxta lite wrap at home. With that being said he tells me he has been using it although his legs extremely swollen and to be honest really does not appear that he has been. I cannot know that for sure however. Nonetheless he has multiple new wounds on the right lower extremity at this time. Obviously we will have to see about getting this rewrapped for him today. 08/11/2019 on evaluation today patient appears to be doing fairly well with regard to his wounds. He has been tolerating the dressing changes including the compression wraps without complication. He still has a lot of edema in his upper thigh regions bilaterally he is supposed to be seeing the lymphedema clinic on the 15th of this month once his wraps arrive for the upper part of his legs. 08/18/2019 on evaluation today patient appears to be doing well with regard to his bilateral lower extremities at this point. He has been tolerating the dressing changes without complication. Fortunately there is no signs of active infection which is also good news. He does have a couple weeping areas on the first and second toe of the right foot he also has just a small area on the left foot upper leg and a small area on the left  lower leg but overall he is doing quite well in my opinion. He is supposed to be getting his wraps shortly in fact tomorrow and then subsequently is seeing the lymphedema clinic next Wednesday on the 21st. Of note he is also leaving on the 25th to go on vacation for a week to the beach. For that reason and since there is some uncertainty about what there can be doing at lymphedema clinic next Wednesday I am get a make an appointment for next Friday here for [place] to see what we need to do for him prior to him leaving for vacation. 10/23; patient arrives in considerable pain predominantly in the upper posterior calf just distal to the popliteal fossa also in the wound anteriorly above the major wound. This is probably cellulitis and he has had this  recurrently in the past. He has no open wound on the right side and he has had an Radio broadcast assistant in that area. Finally I note that he has an area on the left posterior calf which by enlarge is mostly epithelialized. This protrudes beyond the borders of the surrounding skin in the setting of dry scaly skin and lymphedema. The patient is leaving for [facility] on Sunday. Per his longstanding pattern, he will not take his compression pumps with him predominantly out of fear that they will be stolen. He therefore asked that we put a Unna boot back on the right leg. He will also contact the wound care center in [facility] to see if they can change his dressing in the mid week. 11/3; patient returned from his vacation to [facility]. He was seen on 1 occasion at their wound care center. They did a 2 layer compression system as they did not have our 4-layer wrap. I am not completely certain what they put on the wounds. They did not change the Unna boot on the right. The patient is also seeing a lymphedema specialist physical therapist in [place]. It appears that he has some compression sleeve for his thighs which indeed look quite a bit better than I am used to  seeing. He pumps over these with his external compression pumps. 11/10; the patient has a new wound on the right medial thigh otherwise there is no open areas on the right. He has an area on the left leg posteriorly anteriorly and medially and an area over the left second toe. We have been using silver alginate. He thinks the injury on his thigh is secondary to friction from the compression sleeve he has. 11/17; the patient has a new wound on the right medial thigh last week. He thinks this is because he did not have a underlying stocking for his thigh juxta lite apparatus. He now has this. The area is fairly large and somewhat angry but I do not think he has underlying cellulitis. ooHe has a intact blister on the right anterior tibial area. ooSmall wound on the right great toe dorsally ooSmall area on the medial left calf. 11/30; the patient does not have any open areas on his right leg and we did not take his juxta lite stocking off. However he states that on Friday his compression wrap fell down lodging around his upper mid calf area. As usual this creates a lot of problems for him. He called urgently today to be seen for a nurse visit however the nurse visit turned into a provider visit because of extreme erythema and pain in the left anterior tibia extending laterally and posteriorly. The area that is problematic is extensive 10/06/2019 upon evaluation today patient actually appears to be doing poorly in regard to his left lower extremity. He Dr. Leanord Hawking did place him on doxycycline this past Monday apparently due to the fact that he was doing much worse in regard to this left leg. Fortunately the doxycycline does seem to be helping. Unfortunately we are still having a very difficult time getting his edema under any type of control in order to anticipate discharge at some point. The only way were really able to control his lymphedema really is with compression wraps and that has only even  seemingly temporary. He has been seeing a lymphedema clinic they are trying to help in this regard but still this has been somewhat frustrating in general for the patient. 10/13/19 on evaluation today patient  appears to be doing excellent with regard to his right lower extremity as far as the wounds are concerned. His swelling is still quite extensive unfortunately. He is still having a lot of drainage from the thigh areas bilaterally which is unfortunate. He's been going to lymphedema clinic but again he still really does not have this edema under control as far as his lower extremities are concern. With regard to his left lower extremity this seems to be improving and I do believe the doxycycline has been of benefit for him. He is about to complete the doxycycline. 10/20/2019 on evaluation today patient appears to be doing poorly in regard to his bilateral lower extremities. More in the right thigh he has a lot of irritation at this site unfortunately. In regard to the left lower extremity the wrap was not quite as high it appears and does seem to have caused him some trouble as well. Fortunately there is no evidence of systemic infection though he does have some blue-green drainage which has me concerned for the possibility of Pseudomonas. He tells me he is previously taking Cipro without complications and he really does not care for Levaquin however due to some of the side effects he has. He is not allergic to any medications specifically antibiotics that were aware of. 10/27/2019 on evaluation today patient actually does appear to be for the most part doing better when compared to last week's evaluation. With that being said he still has multiple open wounds over the bilateral lower extremities. He actually forgot to start taking the Cipro and states that he still has the whole bottle. He does have several new blisters on left lower extremity today I think I would recommend he go ahead and take the  Cipro based on what I am seeing at this point. 12/30-Patient comes at 1 week visit, 4 layer compression wraps on the left and Unna boot on the right, primary dressing Xtrasorb and silver alginate. Patient is taking his Cipro and has a few more days left probably 5-6, and the legs are doing better. He states he is using his compressions devices which I believe he has 11/10/2019 on evaluation today patient actually appears to be much better than last time I saw him 2 weeks ago. His wounds are significantly improved and overall I am very pleased in this regard. Fortunately there is no signs of active infection at this time. He is just a couple of days away from completing Cipro. Overall his edema is much better he has been using his lymphedema pumps which I think is also helping at this point. 11/17/2019 on evaluation today patient appears to be doing excellent in regard to his wounds in general. His legs are swollen but not nearly as much as they have been in the past. Fortunately he is tolerating the compression wraps without complication. No fevers, chills, nausea, vomiting, or diarrhea. He does have some erythema however in the distal portion of his right lower extremity specifically around the forefoot and toes there is a little bit of warmth here as well. 11/24/2019 on evaluation today patient appears to be doing well with regard to his right lower extremity I really do not see any open wounds at this point. His left lower extremity does have several open areas and his right medial thigh also is open. Other than this however overall the patient seems to be making good progress and I am very pleased at this point. 12/01/2019 on evaluation today patient appears to be  doing poorly at this point in regard to his left lower extremity has several new blisters despite the fact that we have him in compression wraps. In fact he had a 4-layer compression wrap, his upper thigh wrapped from lymphedema clinic, and a  juxta light over top of the 4 layer compression wrap the lymphedema clinic applied and despite all this he still develop blisters underneath. Obviously this does have me concerned about the fact that unfortunately despite what we are doing to try to get wounds healed he continues to have new areas arise I do not think he is ever good to be at the point where he can realistically just use wraps at home to keep things under control. Typically when we heal him it takes about 1-2 days before he is back in the clinic with severe breakdown and blistering of his lower extremities bilaterally. This is happened numerous times in the past. Unfortunately I think that we may need some help as far as overall fluid overload to kind of limit what we are seeing and get things under better control. 12/08/2019 on evaluation today patient presents for follow-up concerning his ongoing bilateral lower extremity edema. Unfortunately he is still having quite a bit of swelling the compression wraps are controlling this to some degree but he did see Dr. [name] his cardiologist I do have that available for review today as far as the appointment was concerned that was on 12/06/2019. Obviously that she has been 2 days ago. The patient states that he is only been taking the Lasix 80 mg 1 time a day he had told me previously he was taking this twice a day. Nonetheless Dr. [name] recommended this be up to 80 mg 2 times a day for the patient as he did appear to be fluid overloaded. With that being said the patient states he did this yesterday and he was unable to go anywhere or do anything due to the fact that he was constantly having to urinate. Nonetheless I think that this is still good to be something that is important for him as far as trying to get his edema under control at all things that he is going to be able to just expect his wounds to get under control and things to be better without going through at least a period of time  where he is trying to stabilize his fluid management in general and I think increasing the Lasix is likely the first step here. It was also mentioned the possibility that the patient may require metolazone. With that being said he wanted to have the patient take Lasix twice a day first and then reevaluating 2 months to see where things stand. 12/15/2019 upon evaluation today patient appears to be doing regard to his legs although his toes are showing some signs of weeping especially on the left at this point to some degree on the right. There does not appear to be any signs of active infection and overall I do feel like the compression wraps are doing well for him but he has not been able to take the Lasix at home and the increased dose that Dr. [name] recommended. He tells me that just not go to be feasible for him. Nonetheless I think in this case he should probably send a message to Dr. [name] in order to discuss options from the standpoint of possible admission to get the fluid off or otherwise going forward. 12/22/2019 upon evaluation today patient appears to be doing fairly  well with regard to his lower extremities at this point. In fact he would be doing excellent if it was not for the fact that his right anterior thigh apparently had an allergic reaction to adhesive tape that he used. The wound itself that we have been monitoring actually appears to be healed. There is a lot of irritation at this point. 12/29/2019 upon evaluation today patient appears to be doing well in regard to his lower extremities. His left medial thigh is open and somewhat draining today but this is the only region that is open the right has done much better with the treatment utilizing the steroid cream that I prescribed for him last week. Overall I am pleased in that regard. Fortunately there is no signs of active infection at this time. No fevers, chills, nausea, vomiting, or diarrhea. 01/05/2020 upon evaluation today patient  appears to be doing more poorly in regard to his right lower extremity at this point upon evaluation today. Unfortunately he continues to have issues in this regard and I think the biggest issue is controlling his edema. This obviously is not very well controlled at this point is been recommended that he use the Lasix twice a day but he has not been able to do that. Unfortunately I think this is leading to an issue where honestly he is not really able to effectively control his edema and therefore the wounds really are not doing significantly better. I do not think that he is going to be able to keep things under good control unless he is able to control his edema much better. I discussed this again in great detail with him today. 01/12/2020 good news is patient actually appears to be doing quite well today at this point. He does have an appointment with lymphedema clinic tomorrow. His legs appear healed and the toe on the left is almost completely healed. In general I am very pleased with how things stand at this point. 01/19/2020 upon evaluation today patient appears to actually be doing well in regard to his lower extremities there is nothing open at this point. Fortunately he has done extremely well more recently. Has been seeing lymphedema clinic as well. With that being said he has Velcro wraps for his lower legs as well as his upper legs. The only wound really is on his toe which is the right great toe and this is barely anything even there. With all that being said I think it is good to be appropriate today to go ahead and switch him over to the Velcro compression wraps. 01/26/2020 upon evaluation today patient appears to be doing worse with regard to his lower extremities after last week switch him to Velcro compression wraps. Unfortunately he lasted less than 24 hours he did not have the sock portion of his Velcro wrap on the left leg and subsequently developed a blister underneath the Velcro  portion. Obviously this is not good and not what we were looking for at this point. He states the lymphedema clinic did tell him to wear the wrap for 23 hours and take him off for 1 I am okay with that plan but again right now we got a get things back under control again he may have some cellulitis noted as well. 02/02/2020 upon evaluation today patient unfortunately appears to have several areas of blistering on his bilateral lower extremities today mainly on the feet. His legs do seem to be doing somewhat better which is good news. Fortunately there is no  evidence of active infection at this time. No fevers, chills, nausea, vomiting, or diarrhea. 02/16/2020 upon evaluation today patient appears to be doing well at this time with regard to his legs. He has a couple weeping areas on his toes but for the most part everything is doing better and does appear to be sealed up on his legs which is excellent news. We can continue with wrapping him at this point as he had every time we discontinue the wraps he just breaks out with new wounds. There is really no point in is going forward with this at this point. 03/08/2020 upon evaluation today patient actually appears to be doing quite well with regard to his lower extremity ulcers. He has just a very superficial and really almost nonexistent blister on the left lower extremity he has in general done very well with the compression wraps. With that being said I do not see any signs of infection at this time which is good news. 03/29/2020 upon evaluation today patient appears to be doing well with regard to his wounds currently except for where he had several new areas that opened up due to some of the wrap slipping and causing him trouble. He states he did not realize they had slipped. Nonetheless he has a 1 area on the right and 3 new areas on the left. Fortunately there is no signs of active infection at this time which is great news. 04/05/2020 upon evaluation  today patient actually appears to be doing quite well in general in regard to his legs currently. Fortunately there is no signs of active infection at this time. No fevers, chills, nausea, vomiting, or diarrhea. He tells me next week that he will actually be seen in the lymphedema clinic on Thursday at 10 AM I see him on Wednesday next week. 04/12/2020 upon evaluation today patient appears to be doing very well with regard to his lower extremities bilaterally. In fact he does not appear to have any open wounds at this point which is good news. Fortunately there is no signs of active infection at this time. No fevers, chills, nausea, vomiting, or diarrhea. 04/19/2020 upon evaluation today patient appears to be doing well with regard to his wounds currently on the bilateral lower extremities. There does not appear to be any signs of active infection at this time. Fortunately there is no evidence of systemic infection and overall very pleased at this point. Nonetheless after I held him out last week he literally had blisters the next morning already which swelled up with him being right back here in the clinic. Overall I think that he is just not can be able to be discharged with his legs the way they are he is much to volume overloaded as far as fluid is concerned and that was discussed with him today of also discussed this but should try the clinic nurse manager as well as Dr. Leanord Hawking. 04/26/2020 upon evaluation today patient appears to be doing better with regard to his wounds currently. He is making some progress and overall swelling is under good control with the compression wraps. Fortunately there is no evidence of active infection at this time. 05/10/2020 on evaluation today patient appears to be doing overall well in regard to his lower extremities bilaterally. He is Tolerating the compression wraps without complication and with what we are seeing currently I feel like that he is making excellent  progress. There is no signs of active infection at this time. 05/24/2020 upon evaluation today  patient appears to be doing well in regard to his legs. The swelling is actually quite a bit down compared to where it has been in the past. Fortunately there is no sign of active infection at this time which is also good news. With that being said he does have several wounds on his toes that have opened up at this point. 05/31/2020 upon evaluation today patient appears to be doing well with regard to his legs bilaterally where he really has no significant fluid buildup at this point overall he seems to be doing quite well. Very pleased in this regard. With regard to his toes these also seem to be drying up which is excellent. We have continue to wrap him as every time we tried as a transition to the juxta light wraps things just do not seem to get any better. 06/07/2020 upon evaluation today patient appears to be doing well with regard to his right leg at this point. Unfortunately left leg has a lot of blistering he tells me the wrap started to slide down on him when he tried to put his other Velcro wrap over top of it to help keep things in order but nonetheless still had some issues. 06/14/2020 on evaluation today patient appears to be doing well with regard to his lower extremity ulcers and foot ulcers at this point. I feel like everything is actually showing signs of improvement which is great news overall there is no signs of active infection at this time. No fevers, chills, nausea, vomiting, or diarrhea. 06/21/2020 on evaluation today patient actually appears to be doing okay in regard to his wounds in general. With that being said the biggest issue I see is on his right foot in particular the first and second toe seem to be doing a little worse due to the fact this is staying very wet. I think he is probably getting need to change out his dressings a couple times in between each week when we see him in  regard to his toes in order to keep this drier based on the location and how this is proceeding. 06/28/2020 on evaluation today patient appears to be doing a little bit more poorly overall in regard to the appearance of the skin I am actually somewhat concerned about the possibility of him having a little bit of an infection here. We discussed the course of potentially giving him a doxycycline prescription which he is taken previously with good result. With that being said I do believe that this is potentially mild and at this point easily fixed. I just do not want anything to get any worse. 07/12/2020 upon evaluation today patient actually appears to be making some progress with regard to his legs which is great news there does not appear to be any evidence of active infection. Overall very pleased with where things stand. 07/26/2020 upon evaluation today patient appears to be doing well with regard to his leg ulcers and toe ulcers at this point. He has been tolerating the compression wraps without complication overall very pleased in this regard. 08/09/2020 upon evaluation today patient appears to be doing well with regard to his lower extremities bilaterally. Fortunately there is no signs of active infection overall I am pleased with where things stand. 08/23/2020 on evaluation today patient appears to be doing well with regard to his wound. He has been tolerating the dressing changes without complication. Fortunately there is no signs of active infection at this time. Overall his legs seem to  be doing quite well which is great news and I am very pleased in that regard. No fevers, chills, nausea, vomiting, or diarrhea. 09/13/2020 upon evaluation today patient appears to be doing okay in regard to his lower extremities. He does have a fairly large blister on the right leg which I did remove the blister tissue from today so we can get this to dry out other than that however he seems to be doing quite  well. There is no signs of active infection at this time. Objective Constitutional Obese and well-hydrated in no acute distress. Vitals Time Taken: 11:07 AM, Height: 70 in, Weight: 380.2 lbs, BMI: 54.5, Temperature: 98.2 F, Pulse: 80 bpm, Respiratory Rate: 22 breaths/min, Blood Pressure: 133/72 mmHg, Capillary Blood Glucose: 167 mg/dl. Respiratory normal breathing without difficulty. Psychiatric this patient is able to make decisions and demonstrates good insight into disease process. Alert and Oriented x 3. pleasant and cooperative. General Notes: His wound bed actually showed signs of good granulation at this time there does not appear to be any evidence of active infection which is great news and overall I am extremely pleased with where things stand today. The patient in general tells me that he is feeling pretty good these days overall although again his legs still do swell significantly. Integumentary (Hair, Skin) Wound #183 status is Healed - Epithelialized. Original cause of wound was Blister. The wound is located on the Left T Second. The wound measures 0cm oe length x 0cm width x 0cm depth; 0cm^2 area and 0cm^3 volume. Other Condition(s) Patient presents with Lymphedema located on the Right Leg. General Notes: cobblestone appearance. large blister noted to anterior lower leg. Patient presents with Lymphedema located on the Left Leg. General Notes: cobblestone appearance. 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 Post procedure Diagnosis Wound #: Same as Pre-Procedure There was a Four Layer Compression Therapy Procedure by Yevonne Pax, RN. Post procedure Diagnosis Wound #: Same as Pre-Procedure Plan Follow-up Appointments: Return Appointment in 2 weeks. - Wed with Leonard Schwartz Nurse Visit: - 1 week Dressing Change Frequency: Do not change entire dressing for one week. Skin Barriers/Peri-Wound Care: Moisturizing lotion - both legs and feet and toes Other: - patient to get urea cream 40% to be used on feet and toes Wound Cleansing: May shower with protection. Primary Wound Dressing: Calcium Alginate with Silver - to blister right lateral lower leg and any weeping areas Secondary Dressing: ABD pad - right lower leg Edema Control: 4 layer compression: Left lower extremity Unna Boot to Right Lower Extremity Avoid standing for long periods of time Elevate legs to the level of the heart or above for 30 minutes daily and/or when sitting, a frequency of: - throughout the day Exercise regularly 1. Would recommend currently that we continue with silver alginate dressing to the open wound location. 2. Also can recommend at this time that we have the patient continue to monitor for any signs of active infection. Obviously the more that he elevates his legs the better. With that being said have the wrap on for 2 weeks probably did not help the  situation either but again there really was not another option as he was out of town last week. We will see patient back for reevaluation in 1 week here in the clinic. If anything worsens or changes patient will contact our office for additional recommendations. Electronic Signature(s) Signed: 09/13/2020 12:12:27 PM By: Lenda Kelp PA-C Entered By: Lenda Kelp on 09/13/2020 12:12:27 -------------------------------------------------------------------------------- SuperBill Details Patient Name: Date of Service: [name], Garhett J.  09/13/2020 Medical Record Number: [id number] Patient Account Number: [id number] Date of Birth/Sex: Treating RN: 10/16/1951 (69 y.o. 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 I89.0 Lymphedema, not elsewhere classified E11.622 Type 2 diabetes mellitus with other skin ulcer 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, unspecified L03.116 Cellulitis of left lower limb Facility Procedures CPT4 Code: [id number] Description: (Facility Use Only) [id number] - APPLY UNNA BOOT RT Modifier: Quantity: 1 CPT4 Code: 52841324 Description: (Facility Use Only) [id number] - APPLY MULTLAY COMPRS LWR LT LEG Modifier: 59 Quantity: 1 Physician Procedures : CPT4 Code Description Modifier [id number] - WC PHYS LEVEL 3 - EST PT ICD-10 Diagnosis Description 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 skin L97.221 Non-pressure chronic ulcer of left calf limited to breakdown of  skin 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

## 2020-09-14 ENCOUNTER — Encounter (HOSPITAL_BASED_OUTPATIENT_CLINIC_OR_DEPARTMENT_OTHER): Payer: Medicare Other | Admitting: Internal Medicine

## 2020-09-14 DIAGNOSIS — L97211 Non-pressure chronic ulcer of right calf limited to breakdown of skin: Secondary | ICD-10-CM | POA: Diagnosis not present

## 2020-09-14 DIAGNOSIS — I87333 Chronic venous hypertension (idiopathic) with ulcer and inflammation of bilateral lower extremity: Secondary | ICD-10-CM | POA: Diagnosis not present

## 2020-09-14 DIAGNOSIS — E1140 Type 2 diabetes mellitus with diabetic neuropathy, unspecified: Secondary | ICD-10-CM | POA: Diagnosis not present

## 2020-09-14 DIAGNOSIS — L03116 Cellulitis of left lower limb: Secondary | ICD-10-CM | POA: Diagnosis not present

## 2020-09-14 DIAGNOSIS — E11622 Type 2 diabetes mellitus with other skin ulcer: Secondary | ICD-10-CM | POA: Diagnosis not present

## 2020-09-14 DIAGNOSIS — I890 Lymphedema, not elsewhere classified: Secondary | ICD-10-CM | POA: Diagnosis not present

## 2020-09-14 DIAGNOSIS — L97221 Non-pressure chronic ulcer of left calf limited to breakdown of skin: Secondary | ICD-10-CM | POA: Diagnosis not present

## 2020-09-14 NOTE — Progress Notes (Signed)
LAMAR, METER ([id number]) Visit Report for 09/14/2020 Arrival Information Details Patient Name: Date of Service: CO PHILIPP, CALLEGARI 09/14/2020 8:00 A M Medical Record Number: [id number] Patient Account Number: [id number] Date of Birth/Sex: Treating RN: [birth year] (69 y.o. [name] Primary Care Orlan Aversa: Nicoletta Ba Other Clinician: Referring Zianne Schubring: Treating Jericca Russett/Extender: Suzzette Righter in Treatment: 242 Visit Information History Since Last Visit Added or deleted any medications: No Patient Arrived: [name] Any new allergies or adverse reactions: No Arrival Time: 08:21 Had a fall or experienced change in No Accompanied By: self activities of daily living that may affect Transfer Assistance: None risk of falls: Patient Identification Verified: Yes Signs or symptoms of abuse/neglect since last visito No Secondary Verification Process Completed: Yes Hospitalized since last visit: No Patient Requires Transmission-Based Precautions: No Implantable device outside of the clinic excluding No Patient Has Alerts: Yes cellular tissue based products placed in the center since last visit: Has Dressing in Place as Prescribed: Yes Has Compression in Place as Prescribed: Yes Pain Present Now: No Electronic Signature(s) Signed: 09/14/2020 5:21:57 PM By: Zenaida Deed RN, BSN Entered By: Zenaida Deed on 09/14/2020 08:21:49 -------------------------------------------------------------------------------- Compression Therapy Details Patient Name: Date of Service: [name], Seanpaul J. 09/14/2020 8:00 A M Medical Record Number: [id number] Patient Account Number: [id number] Date of Birth/Sex: Treating RN: 1951-03-08 (69 y.o. [name] Primary Care Jaycelynn Knickerbocker: Nicoletta Ba Other Clinician: Referring Ezinne Yogi: Treating Mireille Lacombe/Extender: Suzzette Righter in Treatment: 242 Compression Therapy Performed for Wound  Assessment: NonWound Condition Lymphedema - Right Leg Performed By: Clinician Zenaida Deed, RN Compression Type: Henriette Combs Electronic Signature(s) Signed: 09/14/2020 5:21:57 PM By: Zenaida Deed RN, BSN Entered By: Zenaida Deed on 09/14/2020 08:24:14 -------------------------------------------------------------------------------- Compression Therapy Details Patient Name: Date of Service: [name], Cevin J. 09/14/2020 8:00 A M Medical Record Number: [id number] Patient Account Number: [id number] Date of Birth/Sex: Treating RN: [birth year] (69 y.o. [name] Primary Care Fariha Goto: Nicoletta Ba Other Clinician: Referring Tonisha Silvey: Treating Carr Shartzer/Extender: Suzzette Righter in Treatment: 242 Compression Therapy Performed for Wound Assessment: NonWound Condition Lymphedema - Left Leg Performed By: Clinician Zenaida Deed, RN Compression Type: Four Layer Electronic Signature(s) Signed: 09/14/2020 5:21:57 PM By: Zenaida Deed RN, BSN Entered By: Zenaida Deed on 09/14/2020 08:24:34 -------------------------------------------------------------------------------- Encounter Discharge Information Details Patient Name: Date of Service: [name], [place] J. 09/14/2020 8:00 A M Medical Record Number: [id number] Patient Account Number: [id number] Date of Birth/Sex: Treating RN: [birth year] (69 y.o. [name] Primary Care Analy Bassford: Nicoletta Ba Other Clinician: Referring Adham Johnson: Treating Reilynn Lauro/Extender: Suzzette Righter in Treatment: [phone] Encounter Discharge Information Items Discharge Condition: Stable Ambulatory Status: Walker Discharge Destination: Home Transportation: Private Auto Accompanied By: self Schedule Follow-up Appointment: Yes Clinical Summary of Care: Patient Declined Electronic Signature(s) Signed: 09/14/2020 5:21:57 PM By: Zenaida Deed RN, BSN Entered By: Zenaida Deed on 09/14/2020  08:27:12 -------------------------------------------------------------------------------- Patient/Caregiver Education Details Patient Name: Date of Service: [name], [name] 11/11/2021andnbsp8:00 A M Medical Record Number: [id number] Patient Account Number: [id number] Date of Birth/Gender: Treating RN: Jul 12, 1951 (69 y.o. [name] Primary Care Physician: Nicoletta Ba Other Clinician: Referring Physician: Treating Physician/Extender: Suzzette Righter in Treatment: 242 Education Assessment Education Provided To: Patient Education Topics Provided Venous: Methods: Explain/Verbal Responses: Reinforcements needed, State content correctly Electronic Signature(s) Signed: 09/14/2020 5:21:57 PM By: Zenaida Deed RN, BSN Entered By: Zenaida Deed on 09/14/2020 08:26:51 -------------------------------------------------------------------------------- Vitals Details Patient Name: Date of Service: [name], Neilan J. 09/14/2020 8:00 A M Medical  Record Number: [id number] Patient Account Number: [id number] Date of Birth/Sex: Treating RN: May 05, 1951 (69 y.o. [name] Primary Care Alquan Morrish: Nicoletta Ba Other Clinician: Referring Arya Boxley: Treating Janice Bodine/Extender: Suzzette Righter in Treatment: 242 Vital Signs Time Taken: 08:21 Temperature (F): 98 Height (in): 70 Pulse (bpm): 78 Source: Stated Respiratory Rate (breaths/min): 20 Weight (lbs): 380.2 Blood Pressure (mmHg): 149/77 Source: Stated Reference Range: 80 - 120 mg / dl Body Mass Index (BMI): 54.5 Electronic Signature(s) Signed: 09/14/2020 5:21:57 PM By: Zenaida Deed RN, BSN Entered By: Zenaida Deed on 09/14/2020 08:22:54

## 2020-09-14 NOTE — Progress Notes (Signed)
CORBYN, WILDEY ([id number]) Visit Report for 09/14/2020 SuperBill Details Patient Name: Date of Service: CO CALEY, VOLKERT 09/14/2020 Medical Record Number: [id number] Patient Account Number: [id number] Date of Birth/Sex: Treating RN: 1951/08/16 (69 y.o. 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

## 2020-09-15 ENCOUNTER — Telehealth: Payer: Self-pay | Admitting: Family Medicine

## 2020-09-15 NOTE — Telephone Encounter (Signed)
Left detailed message advising ok to get booster. Okay per [facility]

## 2020-09-15 NOTE — Telephone Encounter (Signed)
Please advise, thanks.

## 2020-09-15 NOTE — Telephone Encounter (Signed)
Patient is scheduled to get his Cov[facility] booster at [name] today. He states he received penicillin shots on 09/13/20 and would like to know if that will interfere with his booster today. Please call patient to advise.

## 2020-09-15 NOTE — Telephone Encounter (Signed)
No prob. Ok to get booster now.

## 2020-09-20 ENCOUNTER — Other Ambulatory Visit: Payer: Self-pay

## 2020-09-20 ENCOUNTER — Encounter (HOSPITAL_BASED_OUTPATIENT_CLINIC_OR_DEPARTMENT_OTHER): Payer: Medicare Other | Admitting: Physician Assistant

## 2020-09-20 ENCOUNTER — Other Ambulatory Visit (HOSPITAL_COMMUNITY)
Admission: RE | Admit: 2020-09-20 | Discharge: 2020-09-20 | Disposition: A | Discharge status: Home / Self Care | Payer: Medicare Other | Source: Other Acute Inpatient Hospital | Attending: Physician Assistant | Admitting: Physician Assistant

## 2020-09-20 DIAGNOSIS — L03116 Cellulitis of left lower limb: Secondary | ICD-10-CM | POA: Diagnosis not present

## 2020-09-20 DIAGNOSIS — E1140 Type 2 diabetes mellitus with diabetic neuropathy, unspecified: Secondary | ICD-10-CM | POA: Diagnosis not present

## 2020-09-20 DIAGNOSIS — I890 Lymphedema, not elsewhere classified: Secondary | ICD-10-CM | POA: Diagnosis not present

## 2020-09-20 DIAGNOSIS — L97211 Non-pressure chronic ulcer of right calf limited to breakdown of skin: Secondary | ICD-10-CM | POA: Diagnosis not present

## 2020-09-20 DIAGNOSIS — L97221 Non-pressure chronic ulcer of left calf limited to breakdown of skin: Secondary | ICD-10-CM | POA: Diagnosis not present

## 2020-09-20 DIAGNOSIS — E11622 Type 2 diabetes mellitus with other skin ulcer: Secondary | ICD-10-CM | POA: Diagnosis not present

## 2020-09-20 DIAGNOSIS — I87333 Chronic venous hypertension (idiopathic) with ulcer and inflammation of bilateral lower extremity: Secondary | ICD-10-CM | POA: Diagnosis not present

## 2020-09-20 NOTE — Progress Notes (Signed)
JAMEAL, RAZZANO ([id number]) Visit Report for 09/20/2020 Arrival Information Details Patient Name: Date of Service: CO AVRY, ROEDL 09/20/2020 9:30 A M Medical Record Number: [id number] Patient Account Number: [id number] Date of Birth/Sex: Treating RN: [birth year] (69 y.o. [name], [name] Primary Care Corri Delapaz: Nicoletta Ba Other Clinician: Referring Katianne Barre: Treating Ludean Duhart/Extender: Adele Dan in Treatment: 243 Visit Information History Since Last Visit Added or deleted any medications: No Patient Arrived: [name] Any new allergies or adverse reactions: No Arrival Time: 10:10 Had a fall or experienced change in No Accompanied By: self activities of daily living that may affect Transfer Assistance: None risk of falls: Patient Identification Verified: Yes Signs or symptoms of abuse/neglect since last visito No Secondary Verification Process Completed: Yes Hospitalized since last visit: No Patient Requires Transmission-Based Precautions: No Implantable device outside of the clinic excluding No Patient Has Alerts: Yes cellular tissue based products placed in the center since last visit: Has Dressing in Place as Prescribed: Yes Has Compression in Place as Prescribed: Yes Pain Present Now: No Electronic Signature(s) Signed: 09/20/2020 5:29:55 PM By: Shawn Stall Entered By: Shawn Stall on 09/20/2020 10:17:55 -------------------------------------------------------------------------------- Clinic Level of Care Assessment Details Patient Name: Date of Service: CO KYZER, BLOWE 09/20/2020 9:30 A M Medical Record Number: [id number] Patient Account Number: [id number] Date of Birth/Sex: Treating RN: [birth year] (69 y.o. [name] Primary Care Wilder Kurowski: Nicoletta Ba Other Clinician: Referring Devaeh Amadi: Treating Fenix Rorke/Extender: Adele Dan in Treatment: 243 Clinic Level of Care Assessment Items TOOL 3 Quantity  Score X- 1 0 Use when EandM and Procedure is performed on FOLLOW-UP visit ASSESSMENTS - Nursing Assessment / Reassessment X- 1 10 Reassessment of Co-morbidities (includes updates in patient status) X- 1 5 Reassessment of Adherence to Treatment Plan ASSESSMENTS - Wound and Skin Assessment / Reassessment []  - Points for Wound Assessment can only be taken for a new wound of unknown or different etiology and a procedure is 0 NOT performed to that wound X- 1 5 Simple Wound Assessment / Reassessment - one wound []  - 0 Complex Wound Assessment / Reassessment - multiple wounds X- 1 10 Dermatologic / Skin Assessment (not related to wound area) ASSESSMENTS - Focused Assessment []  - 0 Circumferential Edema Measurements - multi extremities X- 1 10 Nutritional Assessment / Counseling / Intervention []  - 0 Lower Extremity Assessment (monofilament, tuning fork, pulses) []  - 0 Peripheral Arterial Disease Assessment (using hand held doppler) ASSESSMENTS - Ostomy and/or Continence Assessment and Care []  - 0 Incontinence Assessment and Management []  - 0 Ostomy Care Assessment and Management (repouching, etc.) PROCESS - Coordination of Care []  - Points for Discharge Coordination can only be taken for a new wound of unknown or different etiology and a 0 procedure is NOT performed to that wound X- 1 15 Simple Patient / Family Education for ongoing care []  - 0 Complex (extensive) Patient / Family Education for ongoing care X- 1 10 Staff obtains , Records, T Results / Process Orders est []  - 0 Staff telephones HHA, Nursing Homes / Clarify orders / etc []  - 0 Routine Transfer to another Facility (non-emergent condition) []  - 0 Routine Hospital Admission (non-emergent condition) []  - 0 New Admissions / / Ordering NPWT Apligraf, etc. , []  - 0 Emergency Hospital Admission (emergent condition) X- 1 10 Simple Discharge Coordination []  - 0 Complex  (extensive) Discharge Coordination PROCESS - Special Needs []  - 0 Pediatric / Minor Patient Management []  - 0 Isolation Patient Management []  -  0 Hearing / Language / Visual special needs []  - 0 Assessment of Community assistance (transportation, D/C planning, etc.) []  - 0 Additional assistance / Altered mentation []  - 0 Support Surface(s) Assessment (bed, cushion, seat, etc.) INTERVENTIONS - Wound Cleansing / Measurement []  - Points for Wound Cleaning / Measurement, Wound Dressing, Specimen Collection and Specimen taken to lab can 0 only be taken for a new wound of unknown or different etiology and a procedure is NOT performed to that wound X- 1 5 Simple Wound Cleansing - one wound []  - 0 Complex Wound Cleansing - multiple wounds X- 1 5 Wound Imaging (photographs - any number of wounds) []  - 0 Wound Tracing (instead of photographs) X- 1 5 Simple Wound Measurement - one wound []  - 0 Complex Wound Measurement - multiple wounds INTERVENTIONS - Wound Dressings []  - 0 Small Wound Dressing one or multiple wounds []  - 0 Medium Wound Dressing one or multiple wounds X- 1 20 Large Wound Dressing one or multiple wounds INTERVENTIONS - Miscellaneous []  - 0 External ear exam []  - 0 Specimen Collection (cultures, biopsies, blood, body fluids, etc.) X- 1 5 Specimen(s) / Culture(s) sent or taken to Lab for analysis []  - 0 Patient Transfer (multiple staff / Nurse, adultHoyer Lift / Similar devices) []  - 0 Simple Staple / Suture removal (25 or less) []  - 0 Complex Staple / Suture removal (26 or more) []  - 0 Hypo / Hyperglycemic Management (close monitor of Blood Glucose) []  - 0 Ankle / Brachial Index (ABI) - do not check if billed separately []  - 0 Vital Signs Has the patient been seen at the hospital within the last three years: Yes Total Score: 115 Level Of Care: New/Established - Level 3 Electronic Signature(s) Signed: 09/20/2020 5:29:55 PM By: Shawn Stalleaton, [name] Entered By: Shawn Stalleaton,  [name] on 09/20/2020 10:54:08 -------------------------------------------------------------------------------- Compression Therapy Details Patient Name: Date of Service: Candiss NorseCO [name], Trask J. 09/20/2020 9:30 A M Medical Record Number: [id number] Patient Account Number: [id number] Date of Birth/Sex: Treating RN: 06/26/1951 (69 y.o. Tammy SoursM) Deaton, [name] Primary Care Akane Tessier: Nicoletta BaMcGowen, [name] Other Clinician: Referring Kamarah Bilotta: Treating Genevia Bouldin/Extender: Adele DanStone III, Hoyt McGowen, [name] in Treatment: 243 Compression Therapy Performed for Wound Assessment: Wound #185 Right,Lateral Lower Leg Performed By: Clinician Shawn Stalleaton, [name], RN Compression Type: Henriette CombsUnna Boot Electronic Signature(s) Signed: 09/20/2020 5:29:55 PM By: Shawn Stalleaton, [name] Entered By: Shawn Stalleaton, [name] on 09/20/2020 10:45:59 -------------------------------------------------------------------------------- Compression Therapy Details Patient Name: Date of Service: Candiss NorseCO [name], Moosa J. 09/20/2020 9:30 A M Medical Record Number: [id number] Patient Account Number: [id number] Date of Birth/Sex: Treating RN: 10/30/1951 (69 y.o. Tammy SoursM) Deaton, [name] Primary Care Paxton Kanaan: Nicoletta BaMcGowen, [name] Other Clinician: Referring Jaylinn Hellenbrand: Treating Everett Ehrler/Extender: Adele DanStone III, Hoyt McGowen, [name] in Treatment: 243 Compression Therapy Performed for Wound Assessment: Wound #186 Right,Posterior Lower Leg Performed By: Clinician Shawn Stalleaton, [name], RN Compression Type: Henriette CombsUnna Boot Electronic Signature(s) Signed: 09/20/2020 5:29:55 PM By: Shawn Stalleaton, [name] Entered By: Shawn Stalleaton, [name] on 09/20/2020 10:46:00 -------------------------------------------------------------------------------- Compression Therapy Details Patient Name: Date of Service: Candiss NorseCO [name], Gamble J. 09/20/2020 9:30 A M Medical Record Number: [id number] Patient Account Number: [id number] Date of Birth/Sex: Treating RN: 08/24/1951 (69 y.o. Tammy SoursM) Deaton, [name] Primary Care Denicia Pagliarulo: Nicoletta BaMcGowen, [name] Other  Clinician: Referring Kaelyn Innocent: Treating Mardi Cannady/Extender: Adele DanStone III, Hoyt McGowen, [name] in Treatment: 243 Compression Therapy Performed for Wound Assessment: NonWound Condition Lymphedema - Left Leg Performed By: Clinician Shawn Stalleaton, [name], RN Compression Type: Four Layer Electronic Signature(s) Signed: 09/20/2020 5:29:55 PM By: Shawn Stalleaton, [name] Entered By: Shawn Stalleaton, [name] on 09/20/2020 10:46:22 -------------------------------------------------------------------------------- Encounter Discharge Information Details Patient  Name: Date of Service: CO JAMAHL, LEMMONS 09/20/2020 9:30 A M Medical Record Number: [id number] Patient Account Number: [id number] Date of Birth/Sex: Treating RN: [birth year] (69 y.o. [name] Primary Care Madia Carvell: Nicoletta Ba Other Clinician: Referring Hassie Mandt: Treating Suman Trivedi/Extender: Adele Dan in Treatment: [phone] Encounter Discharge Information Items Discharge Condition: Stable Ambulatory Status: Walker Discharge Destination: Home Transportation: Private Auto Accompanied By: self Schedule Follow-up Appointment: Yes Clinical Summary of Care: Electronic Signature(s) Signed: 09/20/2020 5:29:55 PM By: Shawn Stall Entered By: Shawn Stall on 09/20/2020 10:52:14 -------------------------------------------------------------------------------- Patient/Caregiver Education Details Patient Name: Date of Service: [name], [name] 11/17/2021andnbsp9:30 A M Medical Record Number: [id number] Patient Account Number: [id number] Date of Birth/Gender: Treating RN: 14-Aug-1951 (69 y.o. [name] Primary Care Physician: Nicoletta Ba Other Clinician: Referring Physician: Treating Physician/Extender: Adele Dan in Treatment: [phone] Education Assessment Education Provided To: Patient Education Topics Provided Infection: Handouts: CDC antimicrobial patient education_English, Infection  Prevention and Management Methods: Explain/Verbal Responses: Reinforcements needed Electronic Signature(s) Signed: 09/20/2020 5:29:55 PM By: Shawn Stall Entered By: Shawn Stall on 09/20/2020 10:51:35 -------------------------------------------------------------------------------- Wound Assessment Details Patient Name: Date of Service: CO KHAIDYN, STAEBELL 09/20/2020 9:30 A M Medical Record Number: [id number] Patient Account Number: [id number] Date of Birth/Sex: Treating RN: [birth year] (69 y.o. [name] Primary Care Rebel Laughridge: Nicoletta Ba Other Clinician: Referring Geryl Dohn: Treating Shoji Pertuit/Extender: Adele Dan in Treatment: 243 Wound Status Wound Number: 185 Primary Venous Leg Ulcer Etiology: Wound Location: Right, Lateral Lower Leg Secondary Lymphedema Wounding Event: Blister Etiology: Date Acquired: 09/13/2020 Wound Open Weeks Of Treatment: 0 Status: Clustered Wound: No Comorbid Chronic sinus problems/congestion, Arrhythmia, Hypertension, History: Peripheral Arterial Disease, Type II Diabetes, History of Burn, Gout, Confinement Anxiety Wound Measurements Length: (cm) 9.6 Width: (cm) 12.4 Depth: (cm) 0.1 Area: (cm) 93.494 Volume: (cm) 9.349 % Reduction in Area: % Reduction in Volume: Epithelialization: None Tunneling: No Undermining: No Wound Description Classification: Full Thickness Without Exposed Support Structures Wound Margin: Distinct, outline attached Exudate Amount: Large Exudate Type: Purulent Exudate Color: yellow, brown, green Foul Odor After Cleansing: No Slough/Fibrino Yes Wound Bed Granulation Amount: Large (67-100%) Exposed Structure Granulation Quality: Red Fascia Exposed: No Necrotic Amount: Small (1-33%) Fat Layer (Subcutaneous Tissue) Exposed: Yes Necrotic Quality: Adherent Slough Tendon Exposed: No Muscle Exposed: No Joint Exposed: No Bone Exposed: No Treatment Notes Wound #185 (Right,  Lateral Lower Leg) 1. Cleanse With Wound Cleanser Soap and water 2. Periwound Care Barrier cream Moisturizing lotion 3. Primary Dressing Applied Calcium Alginate Ag 4. Secondary Dressing ABD Pad Dry Gauze Kerramax/Xtrasorb 6. Support Layer Careers information officer. Electronic Signature(s) Signed: 09/20/2020 5:29:55 PM By: Shawn Stall Entered By: Shawn Stall on 09/20/2020 10:27:18 -------------------------------------------------------------------------------- Wound Assessment Details Patient Name: Date of Service: CO JAMONT, MELLIN 09/20/2020 9:30 A M Medical Record Number: [id number] Patient Account Number: [id number] Date of Birth/Sex: Treating RN: 03-18-1951 (69 y.o. [name] Primary Care Wendi Lastra: Nicoletta Ba Other Clinician: Referring Hong Timm: Treating Charmeka Freeburg/Extender: Adele Dan in Treatment: 243 Wound Status Wound Number: 186 Primary Venous Leg Ulcer Etiology: Wound Location: Right, Posterior Lower Leg Secondary Lymphedema Wounding Event: Blister Etiology: Date Acquired: 09/20/2020 Wound Open Weeks Of Treatment: 0 Status: Clustered Wound: No Comorbid Chronic sinus problems/congestion, Arrhythmia, Hypertension, History: Peripheral Arterial Disease, Type II Diabetes, History of Burn, Gout, Confinement Anxiety Wound Measurements Length: (cm) 5.5 Width: (cm) 4 Depth: (cm) 0.1 Area: (cm) 17.279 Volume: (cm) 1.728 % Reduction in Area: % Reduction in  Volume: Epithelialization: None Tunneling: No Undermining: No Wound Description Classification: Full Thickness Without Exposed Support Structures Wound Margin: Distinct, outline attached Exudate Amount: Medium Exudate Type: Serosanguineous Exudate Color: red, brown Foul Odor After Cleansing: No Slough/Fibrino No Wound Bed Granulation Amount: Large (67-100%) Exposed Structure Granulation Quality: Red, Pink Fascia Exposed: No Necrotic Amount: None  Present (0%) Fat Layer (Subcutaneous Tissue) Exposed: Yes Tendon Exposed: No Muscle Exposed: No Joint Exposed: No Bone Exposed: No Treatment Notes Wound #186 (Right, Posterior Lower Leg) 1. Cleanse With Wound Cleanser Soap and water 2. Periwound Care Barrier cream Moisturizing lotion 3. Primary Dressing Applied Calcium Alginate Ag 4. Secondary Dressing ABD Pad Dry Gauze Kerramax/Xtrasorb 6. Support Layer Careers information officer. Electronic Signature(s) Signed: 09/20/2020 5:29:55 PM By: Shawn Stall Entered By: Shawn Stall on 09/20/2020 10:32:46 -------------------------------------------------------------------------------- Vitals Details Patient Name: Date of Service: [name], Candler J. 09/20/2020 9:30 A M Medical Record Number: [id number] Patient Account Number: [id number] Date of Birth/Sex: Treating RN: 10/16/1951 (69 y.o. [name] Primary Care Vola Beneke: Nicoletta Ba Other Clinician: Referring Shalina Norfolk: Treating Jimel Myler/Extender: Adele Dan in Treatment: 243 Vital Signs Time Taken: 10:15 Temperature (F): 98.2 Height (in): 70 Pulse (bpm): 71 Weight (lbs): 380.2 Respiratory Rate (breaths/min): 20 Body Mass Index (BMI): 54.5 Blood Pressure (mmHg): 136/75 Reference Range: 80 - 120 mg / dl Electronic Signature(s) Signed: 09/20/2020 5:29:55 PM By: Shawn Stall Entered By: Shawn Stall on 09/20/2020 10:19:10

## 2020-09-20 NOTE — Progress Notes (Signed)
[name], [name] ([id number]) Visit Report for 09/20/2020 SuperBill Details Patient Name: Date of Service: [name], [name] 09/20/2020 Medical Record Number: [id number] Patient Account Number: [id number] Date of Birth/Sex: Treating RN: 12/13/1950 (69 y.o. 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 I89.0 Lymphedema, not elsewhere classified E11.622 Type 2 diabetes mellitus with other skin ulcer 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, unspecified L03.116 Cellulitis of left lower limb Facility Procedures CPT4 Code Description Modifier Quantity [id number] 99213 - WOUND CARE VISIT-LEV 3 EST PT 1 [id number] (Facility Use Only) 29581LT - APPLY MULTLAY COMPRS LWR LT LEG 1 [id number] (Facility Use Only) 48270BE - APPLY UNNA BOOT RT 59 1 Electronic Signature(s) Signed: 09/20/2020 5:04:51 PM By: Lenda Kelp PA-C Signed: 09/20/2020 5:29:55 PM By: Shawn Stall Entered By: Shawn Stall on 09/20/2020 10:54:46

## 2020-09-24 LAB — AEROBIC CULTURE W GRAM STAIN (SUPERFICIAL SPECIMEN)

## 2020-09-27 ENCOUNTER — Encounter (HOSPITAL_BASED_OUTPATIENT_CLINIC_OR_DEPARTMENT_OTHER): Payer: Medicare Other | Admitting: Physician Assistant

## 2020-09-27 ENCOUNTER — Other Ambulatory Visit: Payer: Self-pay

## 2020-09-27 DIAGNOSIS — L97221 Non-pressure chronic ulcer of left calf limited to breakdown of skin: Secondary | ICD-10-CM | POA: Diagnosis not present

## 2020-09-27 DIAGNOSIS — L03116 Cellulitis of left lower limb: Secondary | ICD-10-CM | POA: Diagnosis not present

## 2020-09-27 DIAGNOSIS — I87333 Chronic venous hypertension (idiopathic) with ulcer and inflammation of bilateral lower extremity: Secondary | ICD-10-CM | POA: Diagnosis not present

## 2020-09-27 DIAGNOSIS — I890 Lymphedema, not elsewhere classified: Secondary | ICD-10-CM | POA: Diagnosis not present

## 2020-09-27 DIAGNOSIS — E1140 Type 2 diabetes mellitus with diabetic neuropathy, unspecified: Secondary | ICD-10-CM | POA: Diagnosis not present

## 2020-09-27 DIAGNOSIS — E11622 Type 2 diabetes mellitus with other skin ulcer: Secondary | ICD-10-CM | POA: Diagnosis not present

## 2020-09-27 DIAGNOSIS — L97211 Non-pressure chronic ulcer of right calf limited to breakdown of skin: Secondary | ICD-10-CM | POA: Diagnosis not present

## 2020-09-27 NOTE — Progress Notes (Addendum)
DMONI, FORTSON ([id number]) Visit Report for 09/27/2020 Chief Complaint Document Details Patient Name: Date of Service: [name] 09/27/2020 10:30 A M Medical Record Number: [id number] Patient Account Number: [id number] Date of Birth/Sex: Treating RN: 19-Mar-1951 (69 y.o. [name] Primary Care Provider: Nicoletta Ba Other Clinician: Referring Provider: Treating Provider/Extender: Adele Dan in Treatment: [phone] Information Obtained from: Patient Chief Complaint patient is here for evaluation venous/lymphedema weeping Electronic Signature(s) Signed: 09/27/2020 11:10:20 AM By: Lenda Kelp PA-C Entered By: Lenda Kelp on 09/27/2020 11:10:19 -------------------------------------------------------------------------------- HPI Details Patient Name: Date of Service: [name], [name] 09/27/2020 10:30 A M Medical Record Number: [id number] Patient Account Number: [id number] Date of Birth/Sex: Treating RN: 02/01/1951 (69 y.o. [name] Primary Care Provider: Nicoletta Ba Other Clinician: Referring Provider: Treating Provider/Extender: Adele Dan in Treatment: 244 History of Present Illness HPI Description: Referred by PCP for consultation. Patient has long standing history of BLE venous stasis, no prior ulcerations. At beginning of month, developed cellulitis and weeping. Received IM Rocephin followed by Keflex and resolved. Wears compression stocking, appr 6 months old. Not sure strength. No present drainage. 01/22/16 this is a patient who is a type II diabetic on insulin. 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 He tells me he religiously wears pressure stockings of uncertain strength. He was here with weeping edema about 8 months ago but did not have an open wound. Roughly a month ago he had a reopening on his bilateral legs. He is been using bandages and Neosporin. He  does not complain of pain. 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 Last BUN/creatinine I have is from 11/20/15 at 13 and 1.0 respectively 01/29/16; patient arrives today having tolerated the Profore wrap. He brought in his stockings and these are 18 mmHg stockings he bought from [place]. The compression here is likely inadequate. He does not complain of pain or excessive drainage she has no systemic symptoms. The wound on the right looks improved as does the one on the left although one on the left is more substantial with still tissue at risk below the actual wound area on the bilateral posterior calf 02/05/16; patient arrives with poor edema control. He states that we did put a 4 layer compression on it last week. No weight appear 5 this. 02/12/16; the area on the posterior right Has healed. The left Has a substantial wound that has necrotic surface eschar that requires a debridement with a curette. 02/16/16;the patient called or a Nurse visit secondary to increased swelling. He had been in earlier in the week with his right leg healed. He was transitioned to is on pressure stocking on the right leg with the only open wound on the left, a substantial area on the left posterior calf. Note he has a history of severe lower extremity edema, he has a history of chronic atrial fibrillation but not heart failure per my notes but I'll need to research this. He is not complaining of chest pain shortness of breath or orthopnea. The intake nurse noted blisters on the previously closed right leg 02/19/16; this is the patient's regular visit day. I see him on Friday with escalating edema new wounds on the right leg and clear signs of at least right ventricular heart failure. I increased his Lasix to 40 twice a day.  He is returning currently in follow-up. States he is noticed a decrease in that the  edema 02/26/16 patient's legs have much less edema. There is nothing really open on the right leg. The left leg has improved condition of the large superficial wound on the posterior left leg 03/04/16; edema control is very much better. The patient's right leg wounds have healed. On the left leg he continues to have severe venous inflammation on the posterior aspect of the left leg. There is no tenderness and I don't think any of this is cellulitis. 03/11/16; patient's right leg is married healed and he is in his own stocking. The patient's left leg has deteriorated somewhat. There is a lot of erythema around the wound on the posterior left leg. There is also a significant rim of erythema posteriorly just above where the wrap would've ended there is a new wound in this location and a lot of tenderness. Can't rule out cellulitis in this area. 03/15/16; patient's right leg remains healed and he is in his own stocking. The patient's left leg is much better than last review. His major wound on the posterior aspect of his left Is almost fully epithelialized. He has 3 small injuries from the wraps. Really. Erythema seems a lot better on antibiotics 03/18/16; right leg remains healed and he is in his own stocking. The patient's left leg is much better. The area on the posterior aspect of the left calf is fully epithelialized. His 3 small injuries which were wrap injuries on the left are improved only one seems still open his erythema has resolved 03/25/16; patient's right leg remains healed and he is in his own stocking. There is no open area today on the left leg posterior leg is completely closed up. His wrap injuries at the superior aspect of his leg are also resolved. He looks as though he has some irritation on the dorsal ankle but this is fully epithelialized without evidence of infection. 03/28/16; we discharged this patient on Monday. Transitioned him into his own stocking. There were problems almost  immediately with uncontrolled swelling weeping edema multiple some of which have opened. He does not feel systemically unwell in particular no chest pain no shortness of breath and he does not feel 04/08/16; the edema is under better control with the Profore light wrap but he still has pitting edema. There is one large wound anteriorly 2 on the medial aspect of his left leg and 3 small areas on the superior posterior calf. Drainage is not excessive he is tolerating a Profore light well 04/15/16; put a Profore wrap on him last week. This is controlled is edema however he had a lot of pain on his left anterior foot most of his wounds are healed 04/22/16 once again the patient has denuded areas on the left anterior foot which he states are because his wrap slips up word. He saw his primary physician today is on Lasix 40 twice a day and states that he his weight is down 20 pounds over the last 3 months. 04/29/16: Much improved. left anterior foot much improved. He is now on Lasix 80 mg per day. Much improved edema control 05/06/16; I was hoping to be able to discharge him today however once again he has blisters at a low level of where the compression was placed last week mostly on his left lateral but also his left medial leg and a small area on the anterior part of the left foot. 05/09/16; apparently the patient went  home after his appointment on 7/4 later in the evening developing pain in his upper medial thigh together with subjective fever and chills although his temperature was not taken. The pain was so intense he felt he would probably have to call 911. However he then remembered that he had leftover doxycycline from a previous round of antibiotics and took these. By the next morning he felt a lot better. He called and spoke to one of our nurses and I approved doxycycline over the phone thinking that this was in relation to the wounds we had previously seen although they were definitely were not. The  patient feels a lot better old fever no chills he is still working. Blood sugars are reasonably controlled 05/13/16; patient is back in for review of his cellulitis on his anterior medial upper thigh. He is taking doxycycline this is a lot better. Culture I did of the nodular area on the dorsal aspect of his foot grew MRSA this also looks a lot better. 05/20/16; the patient is cellulitis on the medial upper thigh has resolved. All of his wound areas including the left anterior foot, areas on the medial aspect of the left calf and the lateral aspect of the calf at all resolved. He has a new blister on the left dorsal foot at the level of the fourth toe this was excised. No evidence of infection 05/27/16; patient continues to complain weeping edema. He has new blisterlike wounds on the left anterior lateral and posterior lateral calf at the top of his wrap levels. The area on his left anterior foot appears better. He is not complaining of fever, pain or pruritus in his feet. 05/30/16; the patient's blisters on his left anterior leg posterior calf all look improved. He did not increase the Lasix 100 mg as I suggested because he was going to run out of his 40 mg tablets. He is still having weeping edema of his toes 06/03/16; I renewed his Lasix at 80 mg once a day as he was about to run out when I last saw him. He is on 80 mg of Lasix now. I have asked him to cut down on the excessive amount of water he was drinking and asked him to drink according to his thirst mechanisms 06/12/2016 -- was seen 2 days ago and was supposed to wear his compression stockings at home but he is developed lymphedema and superficial blisters on the left lower extremity and hence came in for a review 06/24/16; the remaining wound is on his left anterior leg. He still has edema coming from between his toes. There is lymphedema here however his edema is generally better than when I last saw this. He has a history of atrial fibrillation  but does not have a known history of congestive heart failure nevertheless I think he probably has this at least on a diastolic basis. 07/01/16 I reviewed his echocardiogram from January 2017. This was essentially normal. He did not have LVH, EF of 55-60%. His right ventricular function was normal although he did have trivial tricuspid and pulmonic regurgitation. This is not audible on exam however. I increased his Lasix to do massive edema in his legs well above his knees I think in early July. He was also drinking an excessive amount of water at the time. 07/15/16; missed his appointment last week because of the Labor Day holiday on Monday. He could not get another appointment later in the week. Started to feel the wrap digging in superiorly so  we remove the top half and the bottom half of his wrap. He has extensive erythema and blistering superiorly in the left leg. Very tender. Very swollen. Edema in his foot with leaking edema fluid. He has not been systemically unwell 07/22/16; the area on the left leg laterally required some debridement. The medial wounds look more stable. His wrap injury wounds appear to have healed. Edema and his foot is better, weeping edema is also better. He tells me he is meeting with the supplier of the external compression pumps at work 08/05/16; the patient was on vacation last week in [facility]. His wrap is been on for an extended period of time. Also over the weekend he developed an extensive area of tender erythema across his anterior medial thigh. He took to doxycycline yesterday that he had leftover from a previous prescription. The patient complains of weeping edema coming out of his toes 08/08/16; I saw this patient on 10/2. He was tender across his anterior thigh. I put him on doxycycline. He returns today in follow-up. He does not have any open wounds on his lower leg, he still has edema weeping into his toes. 08/12/16; patient was seen back urgently today to  follow-up for his extensive left thigh cellulitis/erysipelas. He comes back with a lot less swelling and erythema pain is much better. I believe I gave him Augmentin and Cipro. His wrap was cut down as he stated a roll down his legs. He developed blistering above the level of the wrap that remained. He has 2 open blisters and 1 intact. 08/19/16; patient is been doing his primary doctor who is increased his Lasix from 40-80 once a day or 80 already has less edema. Cellulitis has remained improved in the left thigh. 2 open areas on the posterior left calf 08/26/16; he returns today having new open blisters on the anterior part of his left leg. He has his compression pumps but is not yet been shown how to use some vital representative from the supplier. 09/02/16 patient returns today with no open wounds on the left leg. Some maceration in his plantar toes 09/10/2016 -- Dr. Leanord Hawking had recently discharged him on 09/02/2016 and he has come right back with redness swelling and some open ulcers on his left lower extremity. He says this was caused by trying to apply his compression stockings and he's been unable to use this and has not been able to use his lymphedema pumps. He had some doxycycline leftover and he has started on this a few days ago. 09/16/16; there are no open wounds on his leg on the left and no evidence of cellulitis. 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 He does not complain of symptoms here. I am not clear use using his external compression pumps. 09/23/16; I have not seen this patient in 2 weeks. He canceled his appointment 10 days ago as he was going on vacation. He tells me that on Monday he noticed a large area on his posterior left leg which is been draining copiously and is reopened into a large wound. He is been using ABDs and the external part of his juxtalite, according to our nurse this was not on properly. 10/07/16;  Still a substantial area on the posterior left leg. Using silver alginate 10/14/16; in general better although there is still open area which looks healthy. Still using silver alginate. He reminds me that this happen before he left for [facility].  T oday while he was showering in the morning. He had been using his juxtalite's 10/21/16; the area on his posterior left leg is fully epithelialized. However he arrives today with a large area of tender erythema in his medial and posterior left thigh just above the knee. I have marked the area. Once again he is reluctant to consider hospitalization. I treated him with oral antibiotics in the past for a similar situation with resolution I think with doxycycline however this area it seems more extensive to me. He is not complaining of fever but does have chills and says states he is thirsty. His blood sugar today was in the 140s at home 10/25/16 the area on his posterior left leg is fully epithelialized although there is still some weeping edema. The large area of tenderness and erythema in his medial and posterior left thigh is a lot less tender although there is still a lot of swelling in this thigh. He states he feels a lot better. He is on doxycycline and Augmentin that I started last week. This will continued until Tuesday, December 26. I have ordered a duplex ultrasound of the left thigh rule out DVT whether there is an abscess something that would need to be drained I would also like to know. 11/01/16; he still has weeping edema from a not fully epithelialized area on his left posterior calf. Most of the rest of this looks a lot better. He has completed his antibiotics. His thigh is a lot better. Duplex ultrasound did not show a DVT in the thigh 11/08/16; he comes in today with more Denuded surface epithelium from the posterior aspect of his calf. There is no real evidence of cellulitis. The superior aspect of his wrap appears to have put quite an  indentation in his leg just below the knee and this may have contributed. He does not complain of pain or fever. We have been using silver alginate as the primary dressing. The area of cellulitis in the right thigh has totally resolved. He has been using his compression stockings once a week 11/15/16; the patient arrives today with more loss of epithelium from the posterior aspect of his left calf. He now has a fairly substantial wound in this area. The reason behind this deterioration isn't exactly clear although his edema is not well controlled. He states he feels he is generally more swollen systemically. He is not complaining of chest pain shortness of breath fever. T me he has an appointment with his primary physician in early February. He is on 80 mg of oral ells Lasix a day. He claims compliance with the external compression pumps. He is not having any pain in his legs similar to what he has with his recurrent cellulitis 11/22/16; the patient arrives a follow-up of his large area on his left lateral calf. This looks somewhat better today. He came in earlier in the week for a dressing change since I saw him a week ago. He is not complaining of any pain no shortness of breath no chest pain 11/28/16; the patient arrives for follow-up of his large area on the left lateral calf this does not look better. In fact it is larger weeping edema. The surface of the wound does not look too bad. We have been using silver alginate although I'm not certain that this is a dressing issue. 12/05/16; again the patient follows up for a large wound on the left lateral and left posterior calf this does not look better. There continues  to be weeping edema necrotic surface tissue. More worrisome than this once again there is erythema below the wound involving the distal Achilles and heel suggestive of cellulitis. He is on his feet working most of the day of this is not going well. We are changing his dressing twice a week to  facilitate the drainage. 12/12/16; not much change in the overall dimensions of the large area on the left posterior calf. This is very inflamed looking. I gave him an. Doxycycline last week does not really seem to have helped. He found the wrap very painful indeed it seems to of dog into his legs superiorly and perhaps around the heel. He came in early today because the drainage had soaked through his dressings. 12/19/16- patient arrives for follow-up evaluation of his left lower extremity ulcers. He states that he is using his lymphedema pumps once daily when there is "no drainage". He admits to not using his lipedema pumps while under current treatment. His blood sugars have been consistently between 150-200. 12/26/16; the patient is not using his compression pumps at home because of the wetness on his feet. I've advised him that I think it's important for him to use this daily. He finds his feet too wet, he can put a plastic bag over his legs while he is in the pumps. Otherwise I think will be in a vicious circle. We are using silver alginate to the major area on his left posterior calf 01/02/17; the patient's posterior left leg has further of all into 3 open wounds. All of them covered with a necrotic surface. He claims to be using his compression pumps once a day. His edema control is marginal. Continue with silver alginate 01/10/17; the patient's left posterior leg actually looks somewhat better. There is less edema, less erythema. Still has 3 open areas covered with a necrotic surface requiring debridement. He claims to be using his compression pumps once a day his edema control is better 01/17/17; the patient's left posterior calf look better last week when I saw him and his wrap was changed 2 days ago. He has noted increasing pain in the left heel and arrives today with much larger wounds extensive erythema extending down into the entire heel area especially tender medially. He is not  systemically unwell CBGs have been controlled no fever. Our intake nurse showed me limegreen drainage on his AVD pads. 01/24/17; his usual this patient responds nicely to antibiotics last week giving him Levaquin for presumed Pseudomonas. The whole entire posterior part of his leg is much better much less inflamed and in the case of his Achilles heel area much less tender. He has also had some epithelialization posteriorly there are still open areas here and still draining but overall considerably better 01/31/17- He has continue to tolerate the compression wraps. he states that he continues to use the lymphedema pumps daily, and can increase to twice daily on the weekends. He is voicing no complaints or concerns regarding his LLE ulcers 02/07/17-he is here for follow-up evaluation. He states that he noted some erythema to the left medial and anterior thigh, which he states is new as of yesterday. He is concerned about recurrent cellulitis. He states his blood sugars have been slightly elevated, this morning in the 180s 02/14/17; he is here for follow-up evaluation. When he was last here there was erythema superiorly from his posterior wound in his anterior thigh. He was prescribed Levaquin however a culture of the wound surface grew MRSA over the  phone I changed him to doxycycline on Monday and things seem to be a lot better. 02/24/17; patient missed his appointment on Friday therefore we changed his nurse visit into a physician visit today. Still using silver alginate on the large area of the posterior left thigh. He isn't new area on the dorsal left second toe 03/03/17; actually better today although he admits he has not used his external compression pumps in the last 2 days or so because of work responsibilities over the weekend. 03/10/17; continued improvement. External compression pumps once a day almost all of his wounds have closed on the posterior left calf. Better edema control 03/17/17; in general  improved. He still has 3 small open areas on the lateral aspect of his left leg however most of the area on the posterior part of his leg is epithelialized. He has better edema control. He has an ABD pad under his stocking on the right anterior lower leg although he did not let us look at that today. 03/24/17; patient arrives back in clinic today with no open areas however there are areas on the posterior left calf and anterior left calf that are less than 100% epithelialized. His edema is well controlled in the left lower leg. There is some pitting edema probably lymphedema in the left upper thigh. He uses compression pumps at home once per day. I tried to get him to do this twice a day although he is very reticent. 04/01/2017 -- for the last 2 days he's had significant redness, tenderness and weeping and came in for an urgent visit today. 04/07/17; patient still has 6 more days of doxycycline. He was seen by Dr. Meyer Russel last Wednesday for cellulitis involving the posterior aspect, lateral aspect of his Involving his heel. For the most part he is better there is less erythema and less weeping. He has been on his feet for 12 hours 2 over the weekend. Using his compression pumps once a day 04/14/17 arrives today with continued improvement. Only one area on the posterior left calf that is not fully epithelialized. He has intense bilateral venous inflammation associated with his chronic venous insufficiency disease and secondary lymphedema. We have been using silver alginate to the left posterior calf wound In passing he tells [place] today that the right leg but we have not seen in quite some time has an open area on it but he doesn't want [place] to look at this today states he will show this to [place] next week. 04/21/17; there is no open area on his left leg although he still reports some weeping edema. He showed [place] his right leg today which is the first time we've seen this leg in a long time. He has a large area of  open wound on the right leg anteriorly healthy granulation. Quite a bit of swelling in the right leg and some degree of venous inflammation. He told us about the right leg in passing last week but states that deterioration in the right leg really only happened over the weekend 04/28/17; there is no open area on the left leg although there is an irritated part on the posterior which is like a wrap injury. The wound on the right leg which was new from last week at least to [place] is a lot better. 05/05/17; still no open area on the left leg. Patient is using his new compression stocking which seems to be doing a good job of controlling the edema. He states he is using his compression  pumps once per day. The right leg still has an open wound although it is better in terms of surface area. Required debridement. A lot of pain in the posterior right Achilles marked tenderness. Usually this type of presentation this patient gives concern for an active cellulitis 05/12/17; patient arrives today with his major wound from last week on the right lateral leg somewhat better. Still requiring debridement. He was using his compression stocking on the left leg however that is reopened with superficial wounds anteriorly he did not have an open wound on this leg previously. He is still using his juxta light's once daily at night. He cannot find the time to do this in the morning as he has to be at work by 7 AM 05/19/17; right lateral leg wound looks improved. No debridement required. The concerning area is on the left posterior leg which appears to almost have a subcutaneous hemorrhagic component to it. We've been using silver alginate to all the wounds 05/26/17; the right lateral leg wound continues to look improved. However the area on the left posterior calf is a tightly adherent surface. [place] using silver alginate. Because of the weeping edema in his legs there is very little good alternatives. 06/02/17; the patient left  here last week looking quite good. Major wound on the left posterior calf and a small one on the right lateral calf. Both of these look satisfactory. He tells me that by Wednesday he had noted increased pain in the left leg and drainage. He called on Thursday and Friday to get an appointment here but we were blocked. He did not go to urgent care or his primary physician. He thinks he had a fever on Thursday but did not actually take his temperature. He has not been using his compression pumps on the left leg because of pain. I advised him to go to the emergency room today for IV antibiotics for stents of left leg cellulitis but he has refused I have asked him to take 2 days off work to keep his leg elevated and he has refused this as well. In view of this I'm going to call him and Augmentin and doxycycline. 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 However after a detailed discussion regarding the risk benefits and alternatives he refuses to get admitted to the hospital. With no other recourse I will continue him on oral antibiotics as before and hopefully he'll have his infectious disease consultation this week. 06/16/2017 -- the patient was seen today by the nurse practitioner at infectious disease Ms. Dixon. 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 He has also been advise Lotrimin cream locally. she also had wise IV antibiotics for his condition if it flares up 06/23/17; patient arrives today with drainage bilaterally although the remaining wound on the left posterior calf after  cleaning up today "highlighter yellow drainage" did not look too bad. Unfortunately he has had breakdown on the right anterior leg [previously this leg had not been open and he is using a black stocking] he went to see infectious disease  and is been put on clindamycin 150 mg daily, I did not verify the dose although I'm not familiar with using clindamycin in this dosing range, perhaps for prophylaxisoo 06/27/17; I brought this patient back today to follow-up on the wound deterioration on the right lower leg together with surrounding cellulitis. I started him on doxycycline 4 days ago. This area looks better however he comes in today with intense cellulitis on the medial part of his left thigh. This is not have a wound in this area. Extremely tender. We've been using silver alginate to the wounds on the right lower leg left lower leg with bilateral 4 layer compression he is using his external compression pumps once a day 07/04/17; patient's left medial thigh cellulitis looks better. He has not been using his compression pumps as his insert said it was contraindicated with cellulitis. His right leg continues to make improvements all the wounds are still open. We only have one remaining wound on the left posterior calf. Using silver alginate to all open areas. He is on doxycycline which I started a week ago and should be finishing I gave him Augmentin after Thursday's visit for the severe cellulitis on the left medial thigh which fortunately looks better 07/14/17; the patient's left medial thigh cellulitis has resolved. The cellulitis in his right lower calf on the right also looks better. All of his wounds are stable to improved we've been using silver alginate he has completed the antibiotics I have given him. He has clindamycin 150 mg once a day prescribed by infectious disease for prophylaxis, I've advised him to start this now. We have been using bilateral Unna boots over silver alginate to the wound  areas 07/21/17; the patient is been to see infectious disease who noted his recurrent problems with cellulitis. He was not able to tolerate prophylactic clindamycin therefore he is on amoxicillin 500 twice a day. He also had a second daily dose of Lasix added By Dr. Oneta Rack but he is not taking this. Nor is he being completely compliant with his compression pumps a especially not this week. He has 2 remaining wounds one on the right posterior lateral lower leg and one on the left posterior medial lower leg. 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 The patient has Unna boots bilaterally. 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 He has bilateral Unna boots for compression and silver alginate to his wounds. Arrives today with his legs looking as good as I have seen him in quite some time. Not surprisingly his wounds look better as well with improvement on the right lateral leg venous insufficiency wound and also the left medial leg. He is still using the compression pumps once a day 08/11/17; both legs appear to be doing better wounds on the right lateral and left medial legs look better. Skin on the right leg quite good. He is been using silver alginate as the primary dressing. I'm going to use Anasept gel calcium alginate and maintain all the secondary dressings 08/18/17; the patient continues to actually do quite well. The area on his right lateral leg is just about closed the left medial also looks better although it is still moist in this area. His edema is well controlled we have been using Anasept gel with calcium alginate and  the usual secondary dressings, 4 layer compression and once daily use of his compression pumps "always been able to manage 09/01/17; the patient  continues to do reasonably well in spite of his trip to [place]. The area on the right lateral leg is epithelialized. Left is much better but still open. He has more edema and more chronic erythema on the left leg [venous inflammation] 09/08/17; he arrives today with no open wound on the right lateral leg and decently controlled edema. Unfortunately his left leg is not nearly as in his good situation as last week.he apparently had increasing edema starting on Saturday. He edema soaked through into his foot so used a plastic bag to walk around his home. The area on the medial right leg which was his open area is about the same however he has lost surface epithelium on the left lateral which is new and he has significant pain in the Achilles area of the left foot. He is already on amoxicillin chronically for prophylaxis of cellulitis in the left leg 09/15/17; he is completed a week of doxycycline and the cellulitis in the left posterior leg and Achilles area is as usual improved. He still has a lot of edema and fluid soaking through his dressings. There is no open wound on the right leg. He saw infectious disease NP today 09/22/17;As usual 1 we transition him from our compression wraps to his stockings things did not go well. He has several small open areas on the right leg. He states this was caused by the compression wrap on his skin although he did not wear this with the stockings over them. He has several superficial areas on the left leg medially laterally posteriorly. He does not have any evidence of active cellulitis especially involving the left Achilles The patient is traveling from [facility] Saturday going to [facility]. He states he isn't attempting to get an appointment with a heel objects wound center there to change his dressings. I am not completely certain whether this will work 10/06/17; the patient came in on Friday for a nurse visit and the nurse reported that his legs actually look  quite good. He arrives in clinic today for his regular follow-up visit. He has a new wound on his left third toe over the PIP probably caused by friction with his footwear. He has small areas on the left leg and a very superficial but epithelialized area on the right anterior lateral lower leg. Other than that his legs look as good as I've seen him in quite some time. We have been using silver alginate Review of systems; no chest pain no shortness of breath other than this a 10 point review of systems negative 10/20/17; seen by Dr. Meyer Russel last week. He had taken some antibiotics [doxycycline] that he had left over. Dr. Meyer Russel thought he had candida infection and declined to give him further antibiotics. He has a small wound remaining on the right lateral leg several areas on the left leg including a larger area on the left posterior several left medial and anterior and a small wound on the left lateral. The area on the left dorsal third toe looks a lot better. ROS; Gen.; no fever, respiratory no cough no sputum Cardiac no chest pain other than this 10 point review of system is negative 10/30/17; patient arrives today having fallen in the bathtub 3 days ago. It took him a while to get up. He has pain and maceration in the wounds on his left  leg which have deteriorated. He has not been using his pumps he also has some maceration on the right lateral leg. 11/03/17; patient continues to have weeping edema especially in the left leg. This saturates his dressings which were just put on on 12/27. As usual the doxycycline seems to take care of the cellulitis on his lower leg. He is not complaining of fever, chills, or other systemic symptoms. He states his leg feels a lot better on the doxycycline I gave him empirically. He also apparently gets injections at his primary doctor's officeo Rocephin for cellulitis prophylaxis. I didn't ask him about his compression pump compliance today I think that's probably  marginal. Arrives in the clinic with all of his dressings primary and secondary macerated full of fluid and he has bilateral edema 11/10/17; the patient's right leg looks some better although there is still a cluster of wounds on the right lateral. The left leg is inflamed with almost circumferential skin loss medially to laterally although we are still maintaining anteriorly. He does not have overt cellulitis there is a lot of drainage. He is not using compression pumps. We have been using silver alginate to the wound areas, there are not a lot of options here 11/17/17; the patient's right leg continues to be stable although there is still open wounds, better than last week. The inflammation in the left leg is better. Still loss of surface layer epithelium especially posteriorly. There is no overt cellulitis in the amount of edema and his left leg is really quite good, tells me he is using his compression pumps once a day. 11/24/17; patient's right leg has a small superficial wound laterally this continues to improve. The inflammation in the left leg is still improving however we have continuous surface layer epithelial loss posteriorly. There is no overt cellulitis in the amount of edema in both legs is really quite good. He states he is using his compression pumps on the left leg once a day for 5 out of 7 days 12/01/17; very small superficial areas on the right lateral leg continue to improve. Edema control in both legs is better today. He has continued loss of surface epithelialization and left posterior calf although I think this is better. We have been using silver alginate with large number of absorptive secondary dressings 4 layer on the left Unna boot on the right at his request. He tells me he is using his compression pumps once a day 12/08/17; he has no open area on the right leg is edema control is good here. On the left leg however he has marked erythema and tenderness breakdown of skin. He has  what appears to be a wrap injury just distal to the popliteal fossa. 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 There was swelling erythema and breakdown in the right leg. He has much worse skin breakdown in the left leg as well multiple open areas medially and posteriorly as well as laterally. He tells me he has been using his compression pumps but tells me he feels that the drainage out of his leg is worse when he uses a compression pumps. T be fair to him he is been saying this o for a while however I don't  know that I have really been listening to this. I wonder if the compression pumps are working properly 12/22/17;. Once again he arrives with severe erythema, weeping edema from the left greater than right leg. Noncompliance with compression pumps. New this visit he is complaining of pain on the lateral aspect of the right leg and the medial aspect of his right thigh. He apparently saw his cardiologist Dr. [name] who was ordered an echocardiogram area and I think this is a step in the right direction 12/25/17; started his doxycycline Monday night. There is still intense erythema of the right leg especially in the anterior thigh although there is less tenderness. The erythema around the wound on the right lateral calf also is less tender. He still complaining of pain in the left heel. His wounds are about the same right lateral left medial left lateral. Superficial but certainly not close to closure. He denies being systemically unwell no fever chills no abdominal pain no diarrhea 12/29/17; back in follow-up of his extensive right calf and right thigh cellulitis. I added amoxicillin to cover possible doxycycline resistant  strep. This seems to of done the trick he is in much less pain there is much less erythema and swelling. He has his echocardiogram at 11:00 this morning. X-ray of the left heel was also negative. 01/05/18; the patient arrived with his edema under much better control. Now that he is retired he is able to use his compression pumps daily and sometimes twice a day per the patient. He has a wound on the right leg the lateral wound looks better. Area on the left leg also looks a lot better. He has no evidence of cellulitis in his bilateral thighs I had a quick peak at his echocardiogram. He is in normal ejection fraction and normal left ventricular function. He has moderate pulmonary hypertension moderately reduced right ventricular function. One would have to wonder about chronic sleep apnea although he says he doesn't snore. He'll review the echocardiogram with his cardiologist. 01/12/18; the patient arrives with the edema in both legs under exemplary control. He is using his compression pumps daily and sometimes twice daily. His wound on the right lateral leg is just about closed. He still has some weeping areas on the posterior left calf and lateral left calf although everything is just about closed here as well. I have spoken with Aldean Baker who is the patient's nurse practitioner and infectious disease. She was concerned that the patient had not understood that the parenteral penicillin injections he was receiving for cellulitis prophylaxis was actually benefiting him. I don't think the patient actually saw that I would tend to agree we were certainly dealing with less infections although he had a serious one last month. 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 He is healed. He'll be placed in juxtalite compression wraps and increase his lymphedema pumps to twice daily. We will follow up again next week to ensure there are no issues with the new  regiment. 01/20/18-he is here for evaluation of bilateral lower extremity weeping edema. Yesterday he was placed in compression wrap to the right lower extremity and compression stocking to left lower shrubbery. He states he uses lymphedema pumps last night and again this morning and noted a blister to the left lower extremity. On exam he was noted to have drainage to the right lower extremity. He will be placed in Unna boots bilaterally and follow-up next week 01/26/18; patient was actually discharged a week  ago to his own juxta light stockings only to return the next day with bilateral lower extremity weeping edema.he was placed in bilateral Unna boots. He arrives today with pain in the back of his left leg. There is no open area on the right leg however there is a linear/wrap injury on the left leg and weeping edema on the left leg posteriorly. I spoke with infectious disease about 10 days ago. They were disappointed that the patient elected to discontinue prophylactic intramuscular penicillin shots as they felt it was particularly beneficial in reducing the frequency of his cellulitis. I discussed this with the patient today. He does not share this view. He'll definitely need antibiotics today. Finally [place] and returning a week later and he does not travel with his pumps. He is going by car 01/30/18; patient was seen 4 days ago and brought back in today for review of cellulitis in the left leg posteriorly. I put him on amoxicillin this really hasn't helped as much as I might like. He is also worried because [place]. Finally we will be rewrapping him. There is no open area on the right leg over his left leg has multiple weeping areas as usual 02/09/18; The same wrap on for 10 days. He did not pick up the last doxycycline I prescribed for him. He apparently took 4 days worth he already had. There is nothing open on  his right leg and the edema control is really quite good. He's had damage in the left leg medially and laterally especially probably related to the prolonged use of Unna boots 02/12/18; the patient arrived in clinic today for a nurse visit/wrap change. He complained of a lot of pain in the left posterior calf. He is taking doxycycline that I previously prescribed for him. Unfortunately even though he used his stockings and apparently used to compression pumps twice a day he has weeping edema coming out of the lateral part of his right leg. This is coming from the lower anterior lateral skin area. 02/16/18; the patient has finished his doxycycline and will finish the amoxicillin 2 days. The area of cellulitis in the left calf posteriorly has resolved. He is no longer having any pain. He tells me he is using his compression pumps at least once a day sometimes twice. 02/23/18; the patient finished his doxycycline and Amoxil last week. On Friday he noticed a small erythematous circle about the size of a quarter on the left lower leg just above his ankle. This rapidly expanded and he now has erythema on the lateral and posterior part of the thigh. This is bright red. Also has an area on the dorsal foot just above his toes and a tender area just below the left popliteal fossa. He came off his prophylactic penicillin injections at his own insistence one or 2 months ago. This is obviously deteriorated since then 03/02/18; patient is on doxycycline and Amoxil. Culture I did last week of the weeping area on the back of his left calf grew group B strep. I have therefore renewed the amoxicillin 500 3 times a day for a further week. He has not been systemically unwell. Still complaining of an area of discomfort right under his left popliteal fossa. There is no open wound on the right leg. He tells me that he is using his pumps twice a day on most days 03/09/18; patient arrives in clinic today completing his  amoxicillin  today. The cellulitis on his left leg is better. Furthermore he tells me that he had intramuscular penicillin shots that his primary care office today. However he also states that the wrap on his right leg fell down shortly after leaving clinic last week. He developed a large blister that was present when he came in for a nurse visit later in the week and then he developed intense discomfort around this area.He tells me he is using his compression pumps 03/16/18; the patient has completed his doxycycline. The infectious part of this/cellulitis in the left heel area left popliteal area is a lot better. He has 2 open areas on the right calf. Still areas on the left calf but this is a lot better as well. 03/24/18; the patient arrives complaining of pain in the left popliteal area again. He thinks some of this is wrap injury. He has no open area on the right leg and really no open area on the left calf either except for the popliteal area. He claims to be compliant with the compression pumps 03/31/18; I gave him doxycycline last week because of cellulitis in the left popliteal area. This is a lot better although the surface epithelium is denuded off and response to this. He arrives today with uncontrolled edema in the right calf area as well as a fingernail injury in the right lateral calf. There is only a few open areas on the left 04/06/18; I gave him amoxicillin doxycycline over the last 2 weeks that the amoxicillin should be completing currently. He is not complaining of any pain or systemic symptoms. The only open areas see has is on the right lateral lower leg paradoxically I cannot see anything on the left lower leg. He tells me he is using his compression pumps twice a day on most days. Silver alginate to the wounds that are open under 4 layer compression 04/13/18; he completed antibiotics and has no new complaints. Using his compression pumps. Silver alginate that anything that's opened 04/20/18; he is  using his compression pumps religiously. Silver alginate 4 layer compression anything that's opened. He comes in today with no open wounds on the left leg but 3 on the right including a new one posteriorly. He has 2 on the right lateral and one on the right posterior. He likes Unna boots on the right leg for reasons that aren't really clear we had the usual 4 layer compression on the left. It may be necessary to move to the 4 layer compression on the right however for now I left them in the Unna boots 04/27/18; he is using his compression pumps at least once a day. He has still the wounds on the right lateral calf. The area right posteriorly has closed. He does not have an open wound on the left under 4 layer compression however on the dorsal left foot just proximal to the toes and the left third toe 2 small open areas were identified 05/11/18; he has not uses compression pumps. The areas on the right lateral calf have coalesced into one large wound necrotic surface. On the left side he has one small wound anteriorly however the edema is now weeping out of a large part of his left leg. He says he wasn't using his pumps because of the weeping fluid. I explained to him that this is the time he needs to pump more 05/18/18; patient states he is using his compression pumps twice a day. The area on the right lateral large  wound albeit superficial. On the left side he has innumerable number of small new wounds on the left calf particularly laterally but several anteriorly and medially. All these appear to have healthy granulated base these look like the remnants of blisters however they occurred under compression. The patient arrives in clinic today with his legs somewhat better. There is certainly less edema, less multiple open areas on the left calf and the right anterior leg looks somewhat better as well superficial and a little smaller. However he relates pain and erythema over the last 3-4 days in the thigh  and I looked at this today. He has not been systemically unwell no fever no chills no change in blood sugar values 05/25/18; comes in today in a better state. The severe cellulitis on his left leg seems better with the Keflex. Not as tender. He has not been systemically unwell Hard to find an open wound on the left lower leg using his compression pumps twice a day The confluent wounds on his right lateral calf somewhat better looking. These will ultimately need debridement I didn't do this today. 06/01/18; the severe cellulitis on the left anterior thigh has resolved and he is completed his Keflex. There is no open wound on the left leg however there is a superficial excoriation at the base of the third toe dorsally. Skin on the bottom of his left foot is macerated looking. The left the wounds on the lateral right leg actually looks some better although he did require debridement of the top half of this wound area with an open curet 06/09/18 on evaluation today patient appears to be doing poorly in regard to his right lower extremity in particular this appears to likely be infected he has very thick purulent discharge along with a bright green tent to the discharge. This makes me concerned about the possibility of pseudomonas. He's also having increased discomfort at this point on evaluation. Fortunately there does not appear to be any evidence of infection spreading to the other location at this time. 06/16/18 on evaluation today patient appears to actually be doing fairly well. His ulcer has actually diminished in size quite significantly at this point which is good news. Nonetheless he still does have some evidence of infection he did see infectious disease this morning before coming here for his appointment. I did review the results of their evaluation and their note today. They did actually have him discontinue the Cipro and initiate treatment with linezolid at this time. He is doing this for the  next seven days and they recommended a follow-up in four months with them. He is the keep a log of the need for intermittent antibiotic therapy between now and when he falls back up with infectious disease. This will help them gaze what exactly they need to do to try and help them out. 06/23/18; the patient arrives today with no open wounds on the left leg and left third toe healed. He is been using his compression pumps twice a day. On the right lateral leg he still has a sizable wound but this is a lot better than last time I saw this. 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 Has been using silver alginate under 4 layer compression 06/30/18; the only open wound he has is on the right lateral leg and this looks healthy. No debridement is required. We have been using silver alginate.  He does not have an open wound on the left leg. There is apparently some drainage from the dorsal proximal third toe on the left although I see no open wound here. 07/03/18 on evaluation today patient was actually here just for a nurse visit rapid change. However when he was here on Wednesday for his rat change due to having been healed on the left and then developing blisters we initiated the wrap again knowing that he would be back today for [place] to reevaluate and see were at. Unfortunately he has developed some cellulitis into the proximal portion of his right lower extremity even into the region of his thigh. 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 He was placed on one as what at that point. Nonetheless he is done with that and has been tolerating it well otherwise. Doxycycline which in the past really did not seem to be effective for him. Nonetheless I think the best option may be for [place] to definitely reinitiate the antibiotics for a longer period of time. 07/07/18; since I  last saw this patient a week ago he has had a difficult time. At that point he did not have an open wound on his left leg. We transitioned him into juxta light stockings. He was apparently in the clinic the next day with blisters on the left lateral and left medial lower calf. He also had weeping edema fluid. He was put back into a compression wrap. He was also in the clinic on Friday with intense erythema in his right thigh. Per the patient he was started on Bactrim however that didn't work at all in terms of relieving his pain and swelling. He has taken 3 doxycycline that he had left over from last time and that seems to of helped. He has blistering on the right thigh as well. 07/14/18; the erythema on his right thigh has gotten better with doxycycline that he is finishing. 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 Presumably this cellulitis in the thigh was not related to that although I think this is a bit concerning going forward. He still has an area on the right lateral calf the blister on the right medial calf just below the knee that was discussed above. On the left 2 small open areas left medial and left lateral. Edema control is adequate. He is using his compression pumps twice a day 07/20/18; continued improvement in the condition of both legs especially the edema in his bilateral thighs. He tells me he is been losing weight through a combination of diet and exercise. He is using his compression pumps twice a day. So overall she made to the remaining wounds 07/27/2018; continued improvement in condition of both legs. His edema is well controlled. The area on the right lateral leg is just about closed he had one blisters show up on the medial left upper calf. We have him in 4 layer compression. He is going on a 10-day trip to [place]. He will be driving. He wants to wear Unna boots because of  the lessening amount of constriction. He will not use compression pumps while he is away 08/05/18 on evaluation today patient actually appears to be doing decently well all things considered in regard to his bilateral lower extremities. The worst ulcer is actually only posterior aspect of his left lower extremity  with a four layer compression wrap cut into his leg a couple weeks back. He did have a trip and actually had [employer] for the trip that he is worn since he was last here. Nonetheless he feels like the [employer] actually do better for him his swelling is up a little bit but he also with his trip was not taking his Lasix on a regular set schedule like he was supposed to be. He states that obviously the reason being that he cannot drive and keep going without having to urinate too frequently which makes it difficult. He did not have his pumps with him while he was away either which I think also maybe playing a role here too. 08/13/2018; the patient only has a small open wound on the right lateral calf which is a big improvement in the last month or 2. He also has the area posteriorly just below the posterior fossa on the left which I think was a wrap injury from several weeks ago. He has no current evidence of cellulitis. He tells me he is back into his compression pumps twice a day. He also tells me that while he was at the laundromat somebody stole a section of his extremitease stockings 08/20/2018; back in the clinic with a much improved state. He only has small areas on the right lateral mid calf which is just about healed. This was is more substantial area for quite a prolonged period of time. He has a small open area on the left anterior tibia. The area on the posterior calf just below the popliteal fossa is closed today. He is using his compression pumps twice a day 08/28/2018; patient has no open wound on the right leg. He has a smattering of open areas on the calf with some  weeping lymphedema. More problematically than that it looks as though his wraps of slipped down in his usual he has very angry upper area of edema just below the right medial knee and on the right lateral calf. He has no open area on his feet. The patient is traveling to [facility] next week. I will send him in an antibiotic. We will continue to wrap the right leg. We ordered extremitease stockings for him last week and I plan to transition the right leg to a stocking when he gets home which will be in 10 days time. As usual he is very reluctant to take his pumps with him when he travels 09/07/2018; patient returns from [facility]. He shows me a picture of his left leg in the mid part of his trip last week with intense fire engine erythema. The picture look bad enough I would have considered sending him to the hospital. Instead he went to the wound care center in [facility]. They did not prescribe him antibiotics but he did take some doxycycline he had leftover from a previous visit. I had given him trimethoprim sulfamethoxazole before he left this did not work according to the patient. This is resulted in some improvement fortunately. He comes back with a large wound on the left posterior calf. Smaller area on the left anterior tibia. Denuded blisters on the dorsal left foot over his toes. Does not have much in the way of wounds on the right leg although he does have a very tender area on the right posterior area just below the popliteal fossa also suggestive of infection. He promises me he is back on his pumps twice a day  09/15/2018; the intense cellulitis in his left lower calf is a lot better. The wound area on the posterior left calf is also so better. However he has reasonably extensive wounds on the dorsal aspect of his second and third toes and the proximal foot just at the base of the toes. There is nothing open on the right leg 09/22/2018; the patient has excellent edema  control in his legs bilaterally. He is using his external compression pumps twice a day. He has no open area on the right leg and only the areas in the left foot dorsally second and third toe area on the left side. He does not have any signs of active cellulitis. 10/06/2018; the patient has good edema control bilaterally. He has no open wound on the right leg. There is a blister in the posterior aspect of his left calf that we had to deal with today. He is using his compression pumps twice a day. There is no signs of active cellulitis. We have been using silver alginate to the wound areas. He still has vulnerable areas on the base of his left first second toes dorsally He has a his extremities stockings and we are going to transition him today into the stocking on the right leg. He is cautioned that he will need to continue to use the compression pumps twice a day. If he notices uncontrolled edema in the right leg he may need to go to 3 times a day. 10/13/2018; the patient came in for a nurse check on Friday he has a large flaccid blister on the right medial calf just below the knee. We unroofed this. He has this and a new area underneath the posterior mid calf which was undoubtedly a blister as well. He also has several small areas on the right which is the area we put his extremities stocking on. 10/19/2018; the patient went to see infectious disease this morning I am not sure if that was a routine follow-up in any case the doxycycline I had given him was discontinued and started on linezolid. He has not started this. It is easy to look at his left calf and the inflammation and think this is cellulitis however he is very tender in the tissue just below the popliteal fossa and I have no doubt that there is infection going on here. He states the problem he is having is that with the compression pumps the edema goes down and then starts walking the wrap falls down. We will see if we can adhere this. He  has 1 or 2 minuscule open areas on the right still areas that are weeping on the posterior left calf, the base of his left second and third toes 10/26/18; back today in clinic with quite of skin breakdown in his left anterior leg. This may have been infection the area below the popliteal fossa seems a lot better however tremendous epithelial loss on the left anterior mid tibia area over quite inexpensive tissue. He has 2 blisters on the right side but no other open wound here. 10/29/2018; came in urgently to see [place] today and we worked him in for review. He states that the 4 layer compression on the right leg caused pain he had to cut it down to roughly his mid calf this caused swelling above the wrap and he has blisters and skin breakdown today. As a result of the pain he has not been using his pumps. Both legs are a lot more edematous and there is  a lot of weeping fluid. 11/02/18; arrives in clinic with continued difficulties in the right leg> left. Leg is swollen and painful. multiple skin blisters and new open areas especially laterally. He has not been using his pumps on the right leg. 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 He is not systemically unwell. 11/09/2018; the patient claims he is being compliant with his pumps. He is finished the doxycycline I gave him last week. Culture I did of the wound on the right lateral leg showed a few very resistant methicillin staph aureus. This was resistant to doxycycline. Nevertheless he states the pain in the leg is a lot better which makes me wonder if the cultured organism was not really what was causing the problem nevertheless this is a very dangerous organism to be culturing out of any wound. His right leg is still a lot larger than the left. He is using an Radio broadcast assistant on this area, he blames a 4-layer compression for causing the original skin breakdown which I doubt is true however I cannot talk him out  of it. We have been using silver alginate to all of these areas which were initially blisters 11/16/2018; patient is being compliant with his external compression pumps at twice a day. Miraculously he arrives in clinic today with absolutely no open wounds. He has better edema control on the left where he has been using 4 layer compression versus wound of wounds on the right and I pointed this out to him. There is no inflammation in the skin in his lower legs which is also somewhat unusual for him. There is no open wounds on the dorsal left foot. He has extremitease stockings at home and I have asked him to bring these in next week. 11/25/18 patient's lower extremity on examination today on the left appears for the most part to be wound free. He does have an open wound on the lateral aspect of the right lower extremity but this is minimal compared to what I've seen in past. He does request that we go ahead and wrap the left leg as well even though there's nothing open just so hopefully it will not reopen in short order. 1/28; patient has superficial open wounds on the right lateral calf left anterior calf and left posterior calf. His edema control is adequate. 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 We have been using silver alginate as the primary dressing. He claims compliance with his compression pumps 2/4; patient has superficial open wounds on numerous areas of his left calf and again one on the left dorsal foot. The areas on the right lateral calf have healed. The cellulitis that I gave him doxycycline for last week is also resolved this was mostly on the left anterior calf just below the tibial tuberosity. His edema looks fairly well-controlled. He tells me he went to see his primary doctor today and had blood work ordered 2/11; once again he has several open areas on the left calf left tibial area. Most of  these are small and appear to have healthy granulation. He does not have anything open on the right. The edema and control in his thighs is pretty good which is usually a good indication he has been using his pumps as requested. 2/18; he continues to have several small areas on the left calf and left tibial area. Most of these  are small healthy granulation. We put him in his stocking on the right leg last week and he arrives with a superficial open area over the right upper tibia and a fairly large area on the right lateral tibia in similar condition. His edema control actually does not look too bad, he claims to be using his compression pumps twice a day 2/25. Continued small areas on the left calf and left tibial area. New areas especially on the right are identified just below the tibial tuberosity and on the right upper tibia itself. There are also areas of weeping edema fluid even without an obvious wound. He does not have a considerable degree of lymphedema but clearly there is more edema here than his skin can handle. He states he is using the pumps twice a day. We have an Unna boot on the right and 4 layer compression on the left. 3/3; he continues to have an area on the right lateral calf and right posterior calf just below the popliteal fossa. There is a fair amount of tenderness around the wound on the popliteal fossa but I did not see any evidence of cellulitis, could just be that the wrap came down and rubbed in this area. He does not have an open area on the left leg however there is an area on the left dorsal foot at the base of the third toe We have been using silver alginate to all wound areas 3/10; he did not have an open area on his left leg last time he was here a week ago. T oday he arrives with a horizontal wound just below the tibial tuberosity and an area on the left lateral calf. He has intense erythema and tenderness in this area. The area is on the right lateral calf and  right posterior calf better than last week. We have been using silver alginate as usual 3/18 - Patient returns with 3 small open areas on left calf, and 1 small open area on right calf, the skin looks ok with no significant erythema, he continues the UNA boot on right and 4 layer compression on left. The right lateral calf wound is closed , the right posterior is small area. we will continue silver alginate to the areas. 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 He is been tolerating the dressing changes without complication. Fortunately she has made excellent improvement in regard to the overall status of his wounds. Unfortunately every time we cease wrapping him he ends up reopening in causing more significant issues at that point. Again I'm unsure of the best direction to take although I think the lymphedema clinic may be appropriate for him. 02/03/19 on evaluation today patient appears to be doing well in regard to the wounds that we saw him for last week unfortunately he has a new area on the proximal portion of his right medial/posterior lower extremity where the wrap somewhat slowed down and caused swelling and a blister to rub and open. Unfortunately this is the only opening that he has on either leg at this point. 02/17/19 on evaluation today patient's bilateral lower extremities appear to be doing well. He still completely healed in regard to the left lower extremity. In regard to the right lower extremity the area where the wrap and slid down and caused the blister  still seems to be slightly open although this is dramatically better than during the last evaluation two weeks ago. I'm very pleased with the way this stands overall. 03/03/19 on evaluation today patient appears to be doing well in regard  to his right lower extremity in general although he did have a new blister open this does not appear to be showing any evidence of active infection at this time. Fortunately there's No fevers, chills, nausea, or vomiting noted at this time. Overall I feel like he is making good progress it does feel like that the right leg will we perform the [employer] seems to do with a bit better than three layer wrap on the left which slid down on him. We may switch to doing bilateral in the book wraps. 5/4; I have not seen Mr. Pareja in quite some time. According to our case manager he did not have an open wound on his left leg last week. He had 1 remaining wound on the right posterior medial calf. He arrives today with multiple openings on the left leg probably were blisters and/or wrap injuries from Unna boots. I do not think the Unna boot's will provide adequate compression on the left. I am also not clear about the frequency he is using the compression pumps. 03/17/19 on evaluation today patient appears to be doing excellent in regard to his lower extremities compared to last week's evaluation apparently. He had gotten significantly worse last week which is unfortunate. The [employer] wrap on the left did not seem to do very well for him at all and in fact it didn't control his swelling significantly enough he had an additional outbreak. Subsequently we go back to the four layer compression wrap on the left. This is good news. At least in that he is doing better and the wound seem to be killing him. He still has not heard anything from the lymphedema clinic. 03/24/19 on evaluation today patient actually appears to be doing much better in regard to his bilateral lower Trinity as compared to last week when I saw him. Fortunately there's no signs of active infection at this time. He has been tolerating the dressing changes without complication. Overall I'm extremely pleased with the progress and appearance in  general. 04/07/19 on evaluation today patient appears to be doing well in regard to his bilateral lower extremities. His swelling is significantly down from where it was previous. With that being said he does have a couple blisters still open at this point but fortunately nothing that seems to be too severe and again the majority of the larger openings has healed at this time. 04/14/19 on evaluation today patient actually appears to be doing quite well in regard to his bilateral lower extremities in fact I'm not even sure there's anything significantly open at this time at any site. Nonetheless he did have some trouble with these wraps where they are somewhat irritating him secondary to the fact that he has noted that the graph wasn't too close down to the end of this foot in a little bit short as well up to his knee. Otherwise things seem to be doing quite well. 04/21/19 upon evaluation today patient's wound bed actually showed evidence of being completely healed in regard to both lower extremities which is excellent news. There does not appear to be any signs of active infection which is also good news. I'm very pleased in this regard. No fevers, chills, nausea, or vomiting noted at this time. 04/28/19  on evaluation today patient appears to be doing a little bit worse in regard to both lower extremities on the left mainly due to the fact that when he went infection disease the wrap was not wrapped quite high enough he developed a blister above this. On the right he is a small open area of nothing too significant but again this is continuing to give him some trouble he has been were in the Velcro compression that he has at home. 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 He's been tolerating the dressing changes without complication. Fortunately there's no signs of active infection at this time. No fevers, chills, nausea, or vomiting noted at  this time. We have been trying to get an appointment with her lymphedema clinic in [facility] but unfortunately nobody can get them on phone with not been able to even fax information over the patient likewise is not been able to get in touch with them. Overall I'm not sure exactly what's going on here with to reach out again today. 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 Still having a lot of drainage unfortunately. He tells me especially in the left but even on the right. There's no signs of active infection which is good news we've been using so ratcheted up to this point. 05/19/19 on evaluation today patient actually appears to be doing quite well with regard to his left lower extremity which is great news. Fortunately in regard to the right lower extremity has an issues with his wrap and he subsequently did remove this from what I'm understanding. Nonetheless long story short is what he had rewrapped once he removed it subsequently had maggots underneath this wrap whenever he came in for evaluation today. With that being said they were obviously completely cleaned away by the nursing staff. The visit today which is excellent news. However he does appear to potentially have some infection around the right ankle region where the maggots were located as well. He will likely require anabiotic therapy today. 05/26/19 on evaluation today patient actually appears to be doing much better in regard to his bilateral lower extremities. I feel like the infection is under much better control. With that being said there were maggots noted when the wrap was removed yet again today. Again this could have potentially been left over from previous although at this time there does not appear to be any signs of significant drainage there was obviously on the wrap some drainage as well this contracted gnats or  otherwise. Either way I do not see anything that appears to be doing worse in my pinion and in fact I think his drainage has slowed down quite significantly likely mainly due to the fact to his infection being under better control. 06/02/2019 on evaluation today patient actually appears to be doing well with regard to his bilateral lower extremities there is no signs of active infection at this time which is great news. With that being said he does have several open areas more so on the right than the left but nonetheless these are all significantly better than previously noted. 06/09/2019 on evaluation today patient actually appears to be doing well. His wrap stayed up and he did not cause any problems he had more drainage on the right compared to the left but overall I do not see any major issues at  this time which is great news. 06/16/2019 on evaluation today patient appears to be doing excellent with regard to his lower extremities the only area that is open is a new blister that can have opened as of today on the medial ankle on the left. Other than this he really seems to be doing great I see no major issues at this point. 06/23/2019 on evaluation today patient appears to be doing quite well with regard to his bilateral lower extremities. In fact he actually appears to be almost completely healed there is a small area of weeping noted of the right lower extremity just above the ankle. Nonetheless fortunately there is no signs of active infection at this time which is good news. No fevers, chills, nausea, vomiting, or diarrhea. 8/24; the patient arrived for a nurse visit today but complained of very significant pain in the left leg and therefore I was asked to look at this. Noted that he did not have an open area on the left leg last week nevertheless this was wrapped. The patient states that he is not been able to put his compression pumps on the left leg because of the discomfort. He has not been  systemically unwell 06/30/2019 on evaluation today patient unfortunately despite being excellent last week is doing much worse with regard to his left lower extremity today. In fact he had to come in for a nurse on Monday where his left leg had to be rewrapped due to excessive weeping Dr. Leanord Hawking placed him on doxycycline at that point. Fortunately there is no signs of active infection Systemically at this time which is good news. 07/07/2019 in regard to the patient's wounds today he actually seems to be doing well with his right lower extremity there really is nothing open or draining at this point this is great news. Unfortunately the left lower extremity is given him additional trouble at this time. There does not appear to be any signs of active infection nonetheless he does have a lot of edema and swelling noted at this point as well as blistering all of which has led to a much more poor appearing leg at this time compared to where it was 2 weeks ago when it was almost completely healed. Obviously this is a little discouraging for the patient. He is try to contact the lymphedema clinic in [place] he has not been able to get through to them. 07/14/2019 on evaluation today patient actually appears to be doing slightly better with regard to his left lower extremity ulcers. Overall I do feel like at least at the top of the wrap that we have been placing this area has healed quite nicely and looks much better. The remainder of the leg is showing signs of improvement. Unfortunately in the thigh area he still has an open region on the left and again on the right he has been utilizing just a Band-Aid on an area that also opened on the thigh. Again this is an area that were not able to wrap although we did do an Ace wrap to provide some compression that something that obviously is a little less effective than the compression wraps we have been using on the lower portion of the leg. He does have an  appointment with the lymphedema clinic in [facility] on Friday. 07/21/2019 on evaluation today patient appears to be doing better with regard to his lower extremity ulcers. He has been tolerating the dressing changes without complication. Fortunately there is no signs of active  infection at this time. No fevers, chills, nausea, vomiting, or diarrhea. I did receive the paperwork from the physical therapist at the lymphedema clinic in [place]. Subsequently I signed off on that this morning and sent that back to him for further progression with the treatment plan. 07/28/2019 on evaluation today patient appears to be doing very well with regard to his right lower extremity where I do not see any open wounds at this point. Fortunately he is feeling great as far as that is concerned as well. In regard to the left lower extremity he has been having issues with still several areas of weeping and edema although the upper leg is doing better his lower leg still I think is going require the compression wrap at this time. No fevers, chills, nausea, vomiting, or diarrhea. 08/04/2019 on evaluation today patient unfortunately is having new wounds on the right lower extremity. Again we have been using Unna boot wrap on that side. We switched him to using his juxta lite wrap at home. With that being said he tells me he has been using it although his legs extremely swollen and to be honest really does not appear that he has been. I cannot know that for sure however. Nonetheless he has multiple new wounds on the right lower extremity at this time. Obviously we will have to see about getting this rewrapped for him today. 08/11/2019 on evaluation today patient appears to be doing fairly well with regard to his wounds. He has been tolerating the dressing changes including the compression wraps without complication. He still has a lot of edema in his upper thigh regions bilaterally he is supposed to be seeing the  lymphedema clinic on the 15th of this month once his wraps arrive for the upper part of his legs. 08/18/2019 on evaluation today patient appears to be doing well with regard to his bilateral lower extremities at this point. He has been tolerating the dressing changes without complication. Fortunately there is no signs of active infection which is also good news. He does have a couple weeping areas on the first and second toe of the right foot he also has just a small area on the left foot upper leg and a small area on the left lower leg but overall he is doing quite well in my opinion. He is supposed to be getting his wraps shortly in fact tomorrow and then subsequently is seeing the lymphedema clinic next Wednesday on the 21st. Of note he is also leaving on the 25th to go on vacation for a [place] to the beach. For that reason and since there is some uncertainty about what there can be doing at lymphedema clinic next Wednesday I am get a make an appointment for next Friday here for [place] to see what we need to do for him prior to him leaving for vacation. 10/23; patient arrives in considerable pain predominantly in the upper posterior calf just distal to the popliteal fossa also in the wound anteriorly above the major wound. This is probably cellulitis and he has had this recurrently in the past. He has no open wound on the right side and he has had an Radio broadcast assistant in that area. Finally I note that he has an area on the left posterior calf which by enlarge is mostly epithelialized. This protrudes beyond the borders of the surrounding skin in the setting of dry scaly skin and lymphedema. The patient is leaving for [facility] on Sunday. Per his longstanding pattern, he  will not take his compression pumps with him predominantly out of fear that they will be stolen. He therefore asked that we put a Unna boot back on the right leg. He [place] also [place] if [place] can  change his dressing in the mid week. 11/3; [place] He was seen on 1 occasion at their wound care center. They did a 2 layer compression system as they did not have our 4-layer wrap. I am not completely certain what they put on the wounds. They did not change the Unna boot on the right. The patient is also seeing a lymphedema specialist [place]. It appears that he has some compression sleeve for his thighs which indeed look quite a bit better than I am used to seeing. He pumps over these with his external compression pumps. 11/10; the patient has a new wound on the right medial thigh otherwise there is no open areas on the right. He has an area on the left leg posteriorly anteriorly and medially and an area over the left second toe. We have been using silver alginate. He thinks the injury on his thigh is secondary to friction from the compression sleeve he has. 11/17; the patient has a new wound on the right medial thigh last week. He thinks this is because he did not have a underlying stocking for his thigh juxta lite apparatus. He now has this. The area is fairly large and somewhat angry but I do not think he has underlying cellulitis. He has a intact blister on the right anterior tibial area. Small wound on the right great toe dorsally Small area on the medial left calf. 11/30; the patient does not have any open areas on his right leg and we did not take his juxta lite stocking off. However he states that on Friday his compression wrap fell down lodging around his upper mid calf area. As usual this creates a lot of problems for him. He called urgently today to be seen for a nurse visit however the nurse visit turned into a provider visit because of extreme erythema and pain in the left anterior tibia extending laterally and posteriorly. The area that is problematic is extensive 10/06/2019 upon evaluation today patient actually  appears to be doing poorly in regard to his left lower extremity. He Dr. Leanord Hawkingobson did place him on doxycycline this past Monday apparently due to the fact that he was doing much worse in regard to this left leg. Fortunately the doxycycline does seem to be helping. Unfortunately we are still having a very difficult time getting his edema under any type of control in order to anticipate discharge at some point. The only way were really able to control his lymphedema really is with compression wraps and that has only even seemingly temporary. He has been seeing a lymphedema clinic they are trying to help in this regard but still this has been somewhat frustrating in general for the patient. 10/13/19 on evaluation today patient appears to be doing excellent with regard to his right lower extremity as far as the wounds are concerned. His swelling is still quite extensive unfortunately. He is still having a lot of drainage from the thigh areas bilaterally which is unfortunate. He's been going to lymphedema clinic but again he still really does not have this edema under control as far as his lower extremities are concern. With regard to his left lower  extremity this seems to be improving and I do believe the doxycycline has been of benefit for him. He is about to complete the doxycycline. 10/20/2019 on evaluation today patient appears to be doing poorly in regard to his bilateral lower extremities. More in the right thigh he has a lot of irritation at this site unfortunately. In regard to the left lower extremity the wrap was not quite as high it appears and does seem to have caused him some trouble as well. Fortunately there is no evidence of systemic infection though he does have some blue-green drainage which has me concerned for the possibility of Pseudomonas. He tells me he is previously taking Cipro without complications and he really does not care for Levaquin however due to some of the side effects he  has. He is not allergic to any medications specifically antibiotics that were aware of. 10/27/2019 on evaluation today patient actually does appear to be for the most part doing better when compared to last week's evaluation. With that being said he still has multiple open wounds over the bilateral lower extremities. He actually forgot to start taking the Cipro and states that he still has the whole bottle. He does have several new blisters on left lower extremity today I think I would recommend he go ahead and take the Cipro based on what I am seeing at this point. 12/30-Patient comes at 1 week visit, 4 layer compression wraps on the left and Unna boot on the right, primary dressing Xtrasorb and silver alginate. Patient is taking his Cipro and has a few more days left probably 5-6, and the legs are doing better. He states he is using his compressions devices which I believe he has 11/10/2019 on evaluation today patient actually appears to be much better than last time I saw him 2 weeks ago. His wounds are significantly improved and overall I am very pleased in this regard. Fortunately there is no signs of active infection at this time. He is just a couple of days away from completing Cipro. Overall his edema is much better he has been using his lymphedema pumps which I think is also helping at this point. 11/17/2019 on evaluation today patient appears to be doing excellent in regard to his wounds in general. His legs are swollen but not nearly as much as they have been in the past. Fortunately he is tolerating the compression wraps without complication. No fevers, chills, nausea, vomiting, or diarrhea. He does have some erythema however in the distal portion of his right lower extremity specifically around the forefoot and toes there is a little bit of warmth here as well. 11/24/2019 on evaluation today patient appears to be doing well with regard to his right lower extremity I really do not see any open  wounds at this point. His left lower extremity does have several open areas and his right medial thigh also is open. Other than this however overall the patient seems to be making good progress and I am very pleased at this point. 12/01/2019 on evaluation today patient appears to be doing poorly at this point in regard to his left lower extremity has several new blisters despite the fact that we have him in compression wraps. In fact he had a 4-layer compression wrap, his upper thigh wrapped from lymphedema clinic, and a juxta light over top of the 4 layer compression wrap the lymphedema clinic applied and despite all this he still develop blisters underneath. Obviously this does have me concerned about  the fact that unfortunately despite what we are doing to try to get wounds healed he continues to have new areas arise I do not think he is ever good to be at the point where he can realistically just use wraps at home to keep things under control. Typically when we heal him it takes about 1-2 days before he is back in the clinic with severe breakdown and blistering of his lower extremities bilaterally. This is happened numerous times in the past. Unfortunately I think that we may need some help as far as overall fluid overload to kind of limit what we are seeing and get things under better control. 12/08/2019 on evaluation today patient presents for follow-up concerning his ongoing bilateral lower extremity edema. Unfortunately he is still having quite a bit of swelling the compression wraps are controlling this to some degree but he did see Dr. [name] his cardiologist I do have that available for review today as far as the appointment was concerned that was on 12/06/2019. Obviously that she has been 2 days ago. The patient states that he is only been taking the Lasix 80 mg 1 time a day he had told me previously he was taking this twice a day. Nonetheless Dr. [name] recommended this be up to 80 mg 2 times a  day for the patient as he did appear to be fluid overloaded. With that being said the patient states he did this yesterday and he was unable to go anywhere or do anything due to the fact that he was constantly having to urinate. Nonetheless I think that this is still good to be something that is important for him as far as trying to get his edema under control at all things that he is going to be able to just expect his wounds to get under control and things to be better without going through at least a period of time where he is trying to stabilize his fluid management in general and I think increasing the Lasix is likely the first step here. It was also mentioned the possibility that the patient may require metolazone. With that being said he wanted to have the patient take Lasix twice a day first and then reevaluating 2 months to see where things stand. 12/15/2019 upon evaluation today patient appears to be doing regard to his legs although his toes are showing some signs of weeping especially on the left at this point to some degree on the right. There does not appear to be any signs of active infection and overall I do feel like the compression wraps are doing well for him but he has not been able to take the Lasix at home and the increased dose that Dr. [name] recommended. He tells me that just not go to be feasible for him. Nonetheless I think in this case he should probably send a message to Dr. [name] in order to discuss options from the standpoint of possible admission to get the fluid off or otherwise going forward. 12/22/2019 upon evaluation today patient appears to be doing fairly well with regard to his lower extremities at this point. In fact he would be doing excellent if it was not for the fact that his right anterior thigh apparently had an allergic reaction to adhesive tape that he used. The wound itself that we have been monitoring actually appears to be healed. There is a lot of  irritation at this point. 12/29/2019 upon evaluation today patient appears to be doing well in  regard to his lower extremities. His left medial thigh is open and somewhat draining today but this is the only region that is open the right has done much better with the treatment utilizing the steroid cream that I prescribed for him last week. Overall I am pleased in that regard. Fortunately there is no signs of active infection at this time. No fevers, chills, nausea, vomiting, or diarrhea. 01/05/2020 upon evaluation today patient appears to be doing more poorly in regard to his right lower extremity at this point upon evaluation today. Unfortunately he continues to have issues in this regard and I think the biggest issue is controlling his edema. This obviously is not very well controlled at this point is been recommended that he use the Lasix twice a day but he has not been able to do that. Unfortunately I think this is leading to an issue where honestly he is not really able to effectively control his edema and therefore the wounds really are not doing significantly better. I do not think that he is going to be able to keep things under good control unless he is able to control his edema much better. I discussed this again in great detail with him today. 01/12/2020 good news is patient actually appears to be doing quite well today at this point. He does have an appointment with lymphedema clinic tomorrow. His legs appear healed and the toe on the left is almost completely healed. In general I am very pleased with how things stand at this point. 01/19/2020 upon evaluation today patient appears to actually be doing well in regard to his lower extremities there is nothing open at this point. Fortunately he has done extremely well more recently. Has been seeing lymphedema clinic as well. With that being said he has Velcro wraps for his lower legs as well as his upper legs. The only wound really is on his toe  which is the right great toe and this is barely anything even there. With all that being said I think it is good to be appropriate today to go ahead and switch him over to the Velcro compression wraps. 01/26/2020 upon evaluation today patient appears to be doing worse with regard to his lower extremities after last week switch him to Velcro compression wraps. Unfortunately he lasted less than 24 hours he did not have the sock portion of his Velcro wrap on the left leg and subsequently developed a blister underneath the Velcro portion. Obviously this is not good and not what we were looking for at this point. He states the lymphedema clinic did tell him to wear the wrap for 23 hours and take him off for 1 I am okay with that plan but again right now we got a get things back under control again he may have some cellulitis noted as well. 02/02/2020 upon evaluation today patient unfortunately appears to have several areas of blistering on his bilateral lower extremities today mainly on the feet. His legs do seem to be doing somewhat better which is good news. Fortunately there is no evidence of active infection at this time. No fevers, chills, nausea, vomiting, or diarrhea. 02/16/2020 upon evaluation today patient appears to be doing well at this time with regard to his legs. He has a couple weeping areas on his toes but for the most part everything is doing better and does appear to be sealed up on his legs which is excellent news. We can continue with wrapping him at this point as  he had every time we discontinue the wraps he just breaks out with new wounds. There is really no point in is going forward with this at this point. 03/08/2020 upon evaluation today patient actually appears to be doing quite well with regard to his lower extremity ulcers. He has just a very superficial and really almost nonexistent blister on the left lower extremity he has in general done very well with the compression wraps. With  that being said I do not see any signs of infection at this time which is good news. 03/29/2020 upon evaluation today patient appears to be doing well with regard to his wounds currently except for where he had several new areas that opened up due to some of the wrap slipping and causing him trouble. He states he did not realize they had slipped. Nonetheless he has a 1 area on the right and 3 new areas on the left. Fortunately there is no signs of active infection at this time which is great news. 04/05/2020 upon evaluation today patient actually appears to be doing quite well in general in regard to his legs currently. Fortunately there is no signs of active infection at this time. No fevers, chills, nausea, vomiting, or diarrhea. He tells me next week that he will actually be seen in the lymphedema clinic on Thursday at 10 AM I see him on Wednesday next week. 04/12/2020 upon evaluation today patient appears to be doing very well with regard to his lower extremities bilaterally. In fact he does not appear to have any open wounds at this point which is good news. Fortunately there is no signs of active infection at this time. No fevers, chills, nausea, vomiting, or diarrhea. 04/19/2020 upon evaluation today patient appears to be doing well with regard to his wounds currently on the bilateral lower extremities. There does not appear to be any signs of active infection at this time. Fortunately there is no evidence of systemic infection and overall very pleased at this point. Nonetheless after I held him out last week he literally had blisters the next morning already which swelled up with him being right back here in the clinic. Overall I think that he is just not can be able to be discharged with his legs the way they are he is much to volume overloaded as far as fluid is concerned and that was discussed with him today of also discussed this but should try the clinic nurse manager as well as Dr.  Leanord Hawking. 04/26/2020 upon evaluation today patient appears to be doing better with regard to his wounds currently. He is making some progress and overall swelling is under good control with the compression wraps. Fortunately there is no evidence of active infection at this time. 05/10/2020 on evaluation today patient appears to be doing overall well in regard to his lower extremities bilaterally. He is Tolerating the compression wraps without complication and with what we are seeing currently I feel like that he is making excellent progress. There is no signs of active infection at this time. 05/24/2020 upon evaluation today patient appears to be doing well in regard to his legs. The swelling is actually quite a bit down compared to where it has been in the past. Fortunately there is no sign of active infection at this time which is also good news. With that being said he does have several wounds on his toes that have opened up at this point. 05/31/2020 upon evaluation today patient appears to be doing well with  regard to his legs bilaterally where he really has no significant fluid buildup at this point overall he seems to be doing quite well. Very pleased in this regard. With regard to his toes these also seem to be drying up which is excellent. We have continue to wrap him as every time we tried as a transition to the juxta light wraps things just do not seem to get any better. 06/07/2020 upon evaluation today patient appears to be doing well with regard to his right leg at this point. Unfortunately left leg has a lot of blistering he tells me the wrap started to slide down on him when he tried to put his other Velcro wrap over top of it to help keep things in order but nonetheless still had some issues. 06/14/2020 on evaluation today patient appears to be doing well with regard to his lower extremity ulcers and foot ulcers at this point. I feel like everything is actually showing signs of improvement which  is great news overall there is no signs of active infection at this time. No fevers, chills, nausea, vomiting, or diarrhea. 06/21/2020 on evaluation today patient actually appears to be doing okay in regard to his wounds in general. With that being said the biggest issue I see is on his right foot in particular the first and second toe seem to be doing a little worse due to the fact this is staying very wet. I think he is probably getting need to change out his dressings a couple times in between each week when we see him in regard to his toes in order to keep this drier based on the location and how this is proceeding. 06/28/2020 on evaluation today patient appears to be doing a little bit more poorly overall in regard to the appearance of the skin I am actually somewhat concerned about the possibility of him having a little bit of an infection here. We discussed the course of potentially giving him a doxycycline prescription which he is taken previously with good result. With that being said I do believe that this is potentially mild and at this point easily fixed. I just do not want anything to get any worse. 07/12/2020 upon evaluation today patient actually appears to be making some progress with regard to his legs which is great news there does not appear to be any evidence of active infection. Overall very pleased with where things stand. 07/26/2020 upon evaluation today patient appears to be doing well with regard to his leg ulcers and toe ulcers at this point. He has been tolerating the compression wraps without complication overall very pleased in this regard. 08/09/2020 upon evaluation today patient appears to be doing well with regard to his lower extremities bilaterally. Fortunately there is no signs of active infection overall I am pleased with where things stand. 08/23/2020 on evaluation today patient appears to be doing well with regard to his wound. He has been tolerating the dressing  changes without complication. Fortunately there is no signs of active infection at this time. Overall his legs seem to be doing quite well which is great news and I am very pleased in that regard. No fevers, chills, nausea, vomiting, or diarrhea. 09/13/2020 upon evaluation today patient appears to be doing okay in regard to his lower extremities. He does have a fairly large blister on the right leg which I did remove the blister tissue from today so we can get this to dry out other than that however he seems  to be doing quite well. There is no signs of active infection at this time. 09/27/2020 upon evaluation today patient appears to actually be doing some better in regard to his right leg. Fortunately signs of active infection at this time which is great news. No fevers, chills, nausea, vomiting, or diarrhea. Electronic Signature(s) Signed: 09/27/2020 12:10:46 PM By: Lenda Kelp PA-C Entered By: Lenda Kelp on 09/27/2020 12:10:46 -------------------------------------------------------------------------------- Physical Exam Details Patient Name: Date of Service: [facility], Anthon J. 09/27/2020 10:30 A M Medical Record Number: [id number] Patient Account Number: [id number] Date of Birth/Sex: Treating RN: 07-21-1951 (69 y.o. [name] Primary Care Provider: Nicoletta Ba Other Clinician: Referring Provider: Treating Provider/Extender: Adele Dan in Treatment: 244 Constitutional Obese and well-hydrated in no acute distress. Respiratory normal breathing without difficulty. Psychiatric this patient is able to make decisions and demonstrates good insight into disease process. Alert and Oriented x 3. pleasant and cooperative. Notes Upon inspection patient's wound bed actually showed signs of good granulation at this time and a lot of areas with good epithelial growth. He does still have some erythema noted and I do believe he needs to be on the  antibiotic as soon as possible. Again have already sent this in for him that is Bactrim he should pick that up today. Electronic Signature(s) Signed: 09/27/2020 4:35:26 PM By: Lenda Kelp PA-C Entered By: Lenda Kelp on 09/27/2020 16:35:25 -------------------------------------------------------------------------------- Physician Orders Details Patient Name: Date of Service: [name], Tiger J. 09/27/2020 10:30 A M Medical Record Number: [id number] Patient Account Number: [id number] Date of Birth/Sex: Treating RN: 18-Mar-1951 (69 y.o. 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 Dressing Change Frequency Do not change entire dressing for one week. Skin Barriers/Peri-Wound Care Moisturizing lotion - both legs and feet and toes Other: - patient to get urea cream 40% to be used on feet and toes Wound Cleansing May shower with protection. Primary Wound Dressing Wound #185 Right,Lateral Lower Leg Calcium Alginate with Silver Wound #187 Left,Medial Lower Leg Calcium Alginate with Silver Wound #188 Left,Anterior Lower Leg Calcium Alginate with Silver Secondary Dressing Wound #185 Right,Lateral Lower Leg Dry Gauze ABD pad - as needed Wound #187 Left,Medial Lower  Leg Dry Gauze ABD pad - as needed Wound #188 Left,Anterior Lower Leg Dry Gauze ABD pad - as needed Edema Control 4 layer compression: Left lower extremity Unna Boot to Right Lower Extremity Avoid standing for long periods of time Elevate legs to the level of the heart or above for 30 minutes daily and/or when sitting, a frequency of: - throughout the day Exercise regularly Electronic Signature(s) Signed: 09/27/2020 4:58:27 PM By: Zenaida Deed RN, BSN Signed: 09/27/2020 5:04:05 PM By: Lenda Kelp PA-C Entered By: Zenaida Deed on 09/27/2020 12:11:05 -------------------------------------------------------------------------------- Problem List Details Patient Name: Date of Service: [facility], Travas J. 09/27/2020 10:30 A M Medical Record Number: [id number] Patient Account Number: [id number] Date of  Birth/Sex: Treating RN: 1951-09-19 (69 y.o. [name] Primary Care Provider: Nicoletta Ba Other Clinician: Referring Provider: Treating Provider/Extender: Adele Dan in Treatment: [phone] Active Problems ICD-10 Encounter Code Description Active Date MDM Diagnosis 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 skin 06/30/2018 No Yes L97.221 Non-pressure chronic ulcer of left calf limited to breakdown of skin 09/30/2016 No Yes 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 of 01/22/2016 No Yes bilateral lower extremity I89.0 Lymphedema, not elsewhere classified 01/22/2016 No Yes E11.622 Type 2 diabetes mellitus with other skin ulcer 01/22/2016 No Yes 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, unspecified 01/22/2016 No Yes L03.116 Cellulitis of left lower limb 04/01/2017 No Yes Inactive Problems ICD-10 Code Description Active Date Inactive Date 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 skin 06/30/2017 06/30/2017 L97.521 Non-pressure chronic ulcer of  other 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 of skin 04/27/2018 04/27/2018 L03.115 Cellulitis of right lower limb 12/22/2017 12/22/2017 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 06/30/2018 06/30/2018 L97.511 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 of skin 06/30/2018 06/30/2018 Resolved Problems Electronic Signature(s) Signed: 09/27/2020 11:10:10 AM By: Lenda Kelp PA-C Entered By: Lenda Kelp on 09/27/2020 11:10:08 -------------------------------------------------------------------------------- Progress Note Details Patient Name: Date of Service: [name], Taten J. 09/27/2020 10:30 A M Medical Record Number: [id number] Patient Account Number: [id number] Date of Birth/Sex: Treating RN: [birth year] (69 y.o. [name] Primary Care Provider: Nicoletta Ba Other Clinician: Referring Provider: Treating Provider/Extender: Adele Dan in Treatment: [phone] Subjective Chief Complaint Information obtained from Patient patient is here for evaluation venous/lymphedema weeping History of Present Illness (HPI) Referred by PCP for consultation. Patient has long standing history of BLE venous stasis, no prior ulcerations. At beginning of month, developed cellulitis and weeping. Received IM Rocephin followed by Keflex and resolved. Wears compression stocking, appr 6 months old. Not sure strength. No present drainage. 01/22/16 this is a patient who is a type II diabetic on insulin. 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 He tells me he religiously wears pressure stockings of uncertain strength. He was here with weeping edema about 8 months ago but did not have an open wound. Roughly a month ago he had a reopening on his bilateral legs. He is been using bandages and Neosporin. He does not complain of pain. 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 Last BUN/creatinine I have is from 11/20/15 at 13 and 1.0 respectively 01/29/16; patient arrives today having tolerated the Profore wrap. He brought in his stockings and these are 18 mmHg stockings he bought from [place]. The compression here is likely inadequate. He does not complain of pain or excessive drainage she has no systemic symptoms. The wound on the right looks improved as does the one on the left although one on the left is more substantial with still tissue at risk below the actual wound area on the bilateral posterior calf 02/05/16; patient arrives with poor edema control. He states that we did put a 4 layer compression on it last week. No weight appear 5 this. 02/12/16; the area on the posterior right Has healed. The left Has a substantial wound that has necrotic surface eschar that requires a debridement with a curette. 02/16/16;the patient called  or a Nurse visit secondary to increased swelling. He had been in earlier in the week with his right leg healed. He was transitioned to is on pressure stocking on the right leg with the only open wound on the left, a substantial area on the left posterior calf. Note he has a history of severe lower extremity edema, he has a history of chronic atrial fibrillation but not heart failure per my notes but I'll need to research this. He is not complaining of chest pain shortness of breath or orthopnea. The intake nurse noted blisters on the previously closed right leg 02/19/16; this is the patient's regular visit day. I see him on Friday with escalating edema new wounds on the right leg and clear signs of at least right ventricular heart failure. I increased his Lasix to 40 twice a day. He is returning currently in follow-up. States he is noticed a decrease in that the edema 02/26/16 patient's legs have much less edema. There is nothing really open on the right leg. The  left leg has improved condition of the large superficial wound on the posterior left leg 03/04/16; edema control is very much better. The patient's right leg wounds have healed. On the left leg he continues to have severe venous inflammation on the posterior aspect of the left leg. There is no tenderness and I don't think any of this is cellulitis. 03/11/16; patient's right leg is married healed and he is in his own stocking. The patient's left leg has deteriorated somewhat. There is a lot of erythema around the wound on the posterior left leg. There is also a significant rim of erythema posteriorly just above where the wrap would've ended there is a new wound in this location and a lot of tenderness. Can't rule out cellulitis in this area. 03/15/16; patient's right leg remains healed and he is in his own stocking. The patient's left leg is much better than last review. His major wound on the posterior aspect of his left Is almost fully epithelialized. He has 3 small injuries from the wraps. Really. Erythema seems a lot better on antibiotics 03/18/16; right leg remains healed and he is in his own stocking. The patient's left leg is much better. The area on the posterior aspect of the left calf is fully epithelialized. His 3 small injuries which were wrap injuries on the left are improved only one seems still open his erythema has resolved 03/25/16; patient's right leg remains healed and he is in his own stocking. There is no open area today on the left leg posterior leg is completely closed up. His wrap injuries at the superior aspect of his leg are also resolved. He looks as though he has some irritation on the dorsal ankle but this is fully epithelialized without evidence of infection. 03/28/16; we discharged this patient on Monday. Transitioned him into his own stocking. There were problems almost immediately with uncontrolled swelling weeping edema multiple some of which have opened. He does not feel  systemically unwell in particular no chest pain no shortness of breath and he does not feel 04/08/16; the edema is under better control with the Profore light wrap but he still has pitting edema. There is one large wound anteriorly 2 on the medial aspect of his left leg and 3 small areas on the superior posterior calf. Drainage is not excessive he is tolerating a Profore light well 04/15/16; put a Profore wrap on him last week. This is controlled is edema however he had  a lot of pain on his left anterior foot most of his wounds are healed 04/22/16 once again the patient has denuded areas on the left anterior foot which he states are because his wrap slips up word. He saw his primary physician today is on Lasix 40 twice a day and states that he his weight is down 20 pounds over the last 3 months. 04/29/16: Much improved. left anterior foot much improved. He is now on Lasix 80 mg per day. Much improved edema control 05/06/16; I was hoping to be able to discharge him today however once again he has blisters at a low level of where the compression was placed last week mostly on his left lateral but also his left medial leg and a small area on the anterior part of the left foot. 05/09/16; apparently the patient went home after his appointment on 7/4 later in the evening developing pain in his upper medial thigh together with subjective fever and chills although his temperature was not taken. The pain was so intense he felt he would probably have to call 911. However he then remembered that he had leftover doxycycline from a previous round of antibiotics and took these. By the next morning he felt a lot better. He called and spoke to one of our nurses and I approved doxycycline over the phone thinking that this was in relation to the wounds we had previously seen although they were definitely were not. The patient feels a lot better old fever no chills he is still working. Blood sugars are reasonably  controlled 05/13/16; patient is back in for review of his cellulitis on his anterior medial upper thigh. He is taking doxycycline this is a lot better. Culture I did of the nodular area on the dorsal aspect of his foot grew MRSA this also looks a lot better. 05/20/16; the patient is cellulitis on the medial upper thigh has resolved. All of his wound areas including the left anterior foot, areas on the medial aspect of the left calf and the lateral aspect of the calf at all resolved. He has a new blister on the left dorsal foot at the level of the fourth toe this was excised. No evidence of infection 05/27/16; patient continues to complain weeping edema. He has new blisterlike wounds on the left anterior lateral and posterior lateral calf at the top of his wrap levels. The area on his left anterior foot appears better. He is not complaining of fever, pain or pruritus in his feet. 05/30/16; the patient's blisters on his left anterior leg posterior calf all look improved. He did not increase the Lasix 100 mg as I suggested because he was going to run out of his 40 mg tablets. He is still having weeping edema of his toes 06/03/16; I renewed his Lasix at 80 mg once a day as he was about to run out when I last saw him. He is on 80 mg of Lasix now. I have asked him to cut down on the excessive amount of water he was drinking and asked him to drink according to his thirst mechanisms 06/12/2016 -- was seen 2 days ago and was supposed to wear his compression stockings at home but he is developed lymphedema and superficial blisters on the left lower extremity and hence came in for a review 06/24/16; the remaining wound is on his left anterior leg. He still has edema coming from between his toes. There is lymphedema here however his edema is generally better than  when I last saw this. He has a history of atrial fibrillation but does not have a known history of congestive heart failure nevertheless I think he probably  has this at least on a diastolic basis. 07/01/16 I reviewed his echocardiogram from January 2017. This was essentially normal. He did not have LVH, EF of 55-60%. His right ventricular function was normal although he did have trivial tricuspid and pulmonic regurgitation. This is not audible on exam however. I increased his Lasix to do massive edema in his legs well above his knees I think in early July. He was also drinking an excessive amount of water at the time. 07/15/16; missed his appointment last week because of the Labor Day holiday on Monday. He could not get another appointment later in the week. Started to feel the wrap digging in superiorly so we remove the top half and the bottom half of his wrap. He has extensive erythema and blistering superiorly in the left leg. Very tender. Very swollen. Edema in his foot with leaking edema fluid. He has not been systemically unwell 07/22/16; the area on the left leg laterally required some debridement. The medial wounds look more stable. His wrap injury wounds appear to have healed. Edema and his foot is better, weeping edema is also better. He tells me he is meeting with the supplier of the external compression pumps at work 08/05/16; the patient was on vacation last week in [facility]. His wrap is been on for an extended period of time. Also over the weekend he developed an extensive area of tender erythema across his anterior medial thigh. He took to doxycycline yesterday that he had leftover from a previous prescription. The patient complains of weeping edema coming out of his toes 08/08/16; I saw this patient on 10/2. He was tender across his anterior thigh. I put him on doxycycline. He returns today in follow-up. He does not have any open wounds on his lower leg, he still has edema weeping into his toes. 08/12/16; patient was seen back urgently today to follow-up for his extensive left thigh cellulitis/erysipelas. He comes back with a lot less  swelling and erythema pain is much better. I believe I gave him Augmentin and Cipro. His wrap was cut down as he stated a roll down his legs. He developed blistering above the level of the wrap that remained. He has 2 open blisters and 1 intact. 08/19/16; patient is been doing his primary doctor who is increased his Lasix from 40-80 once a day or 80 already has less edema. Cellulitis has remained improved in the left thigh. 2 open areas on the posterior left calf 08/26/16; he returns today having new open blisters on the anterior part of his left leg. He has his compression pumps but is not yet been shown how to use some vital representative from the supplier. 09/02/16 patient returns today with no open wounds on the left leg. Some maceration in his plantar toes 09/10/2016 -- Dr. Leanord Hawking had recently discharged him on 09/02/2016 and he has come right back with redness swelling and some open ulcers on his left lower extremity. He says this was caused by trying to apply his compression stockings and he's been unable to use this and has not been able to use his lymphedema pumps. He had some doxycycline leftover and he has started on this a few days ago. 09/16/16; there are no open wounds on his leg on the left and no evidence of cellulitis. He does continue to  have probable lymphedema of his toes, drainage and maceration between his toes. He does not complain of symptoms here. I am not clear use using his external compression pumps. 09/23/16; I have not seen this patient in 2 weeks. He canceled his appointment 10 days ago as he was going on vacation. He tells me that on Monday he noticed a large area on his posterior left leg which is been draining copiously and is reopened into a large wound. He is been using ABDs and the external part of his juxtalite, according to our nurse this was not on properly. 10/07/16; Still a substantial area on the posterior left leg. Using silver alginate 10/14/16; in  general better although there is still open area which looks healthy. Still using silver alginate. He reminds me that this happen before he left for [facility]. T oday while he was showering in the morning. He had been using his juxtalite's 10/21/16; the area on his posterior left leg is fully epithelialized. However he arrives today with a large area of tender erythema in his medial and posterior left thigh just above the knee. I have marked the area. Once again he is reluctant to consider hospitalization. I treated him with oral antibiotics in the past for a similar situation with resolution I think with doxycycline however this area it seems more extensive to me. He is not complaining of fever but does have chills and says states he is thirsty. His blood sugar today was in the 140s at home 10/25/16 the area on his posterior left leg is fully epithelialized although there is still some weeping edema. The large area of tenderness and erythema in his medial and posterior left thigh is a lot less tender although there is still a lot of swelling in this thigh. He states he feels a lot better. He is on doxycycline and Augmentin that I started last week. This will continued until Tuesday, December 26. I have ordered a duplex ultrasound of the left thigh rule out DVT whether there is an abscess something that would need to be drained I would also like to know. 11/01/16; he still has weeping edema from a not fully epithelialized area on his left posterior calf. Most of the rest of this looks a lot better. He has completed his antibiotics. His thigh is a lot better. Duplex ultrasound did not show a DVT in the thigh 11/08/16; he comes in today with more Denuded surface epithelium from the posterior aspect of his calf. There is no real evidence of cellulitis. The superior aspect of his wrap appears to have put quite an indentation in his leg just below the knee and this may have contributed. He does not complain  of pain or fever. We have been using silver alginate as the primary dressing. The area of cellulitis in the right thigh has totally resolved. He has been using his compression stockings once a week 11/15/16; the patient arrives today with more loss of epithelium from the posterior aspect of his left calf. He now has a fairly substantial wound in this area. The reason behind this deterioration isn't exactly clear although his edema is not well controlled. He states he feels he is generally more swollen systemically. He is not complaining of chest pain shortness of breath fever. T me he has an appointment with his primary physician in early February. He is on 80 mg of oral ells Lasix a day. He claims compliance with the external compression pumps. He is not having  any pain in his legs similar to what he has with his recurrent cellulitis 11/22/16; the patient arrives a follow-up of his large area on his left lateral calf. This looks somewhat better today. He came in earlier in the week for a dressing change since I saw him a week ago. He is not complaining of any pain no shortness of breath no chest pain 11/28/16; the patient arrives for follow-up of his large area on the left lateral calf this does not look better. In fact it is larger weeping edema. The surface of the wound does not look too bad. We have been using silver alginate although I'm not certain that this is a dressing issue. 12/05/16; again the patient follows up for a large wound on the left lateral and left posterior calf this does not look better. There continues to be weeping edema necrotic surface tissue. More worrisome than this once again there is erythema below the wound involving the distal Achilles and heel suggestive of cellulitis. He is on his feet working most of the day of this is not going well. We are changing his dressing twice a week to facilitate the drainage. 12/12/16; not much change in the overall dimensions of the large area  on the left posterior calf. This is very inflamed looking. I gave him an. Doxycycline last week does not really seem to have helped. He found the wrap very painful indeed it seems to of dog into his legs superiorly and perhaps around the heel. He came in early today because the drainage had soaked through his dressings. 12/19/16- patient arrives for follow-up evaluation of his left lower extremity ulcers. He states that he is using his lymphedema pumps once daily when there is "no drainage". He admits to not using his lipedema pumps while under current treatment. His blood sugars have been consistently between 150-200. 12/26/16; the patient is not using his compression pumps at home because of the wetness on his feet. I've advised him that I think it's important for him to use this daily. He finds his feet too wet, he can put a plastic bag over his legs while he is in the pumps. Otherwise I think will be in a vicious circle. We are using silver alginate to the major area on his left posterior calf 01/02/17; the patient's posterior left leg has further of all into 3 open wounds. All of them covered with a necrotic surface. He claims to be using his compression pumps once a day. His edema control is marginal. Continue with silver alginate 01/10/17; the patient's left posterior leg actually looks somewhat better. There is less edema, less erythema. Still has 3 open areas covered with a necrotic surface requiring debridement. He claims to be using his compression pumps once a day his edema control is better 01/17/17; the patient's left posterior calf look better last week when I saw him and his wrap was changed 2 days ago. He has noted increasing pain in the left heel and arrives today with much larger wounds extensive erythema extending down into the entire heel area especially tender medially. He is not systemically unwell CBGs have been controlled no fever. Our intake nurse showed me limegreen drainage on his  AVD pads. 01/24/17; his usual this patient responds nicely to antibiotics last week giving him Levaquin for presumed Pseudomonas. The whole entire posterior part of his leg is much better much less inflamed and in the case of his Achilles heel area much less tender. He has also  had some epithelialization posteriorly there are still open areas here and still draining but overall considerably better 01/31/17- He has continue to tolerate the compression wraps. he states that he continues to use the lymphedema pumps daily, and can increase to twice daily on the weekends. He is voicing no complaints or concerns regarding his LLE ulcers 02/07/17-he is here for follow-up evaluation. He states that he noted some erythema to the left medial and anterior thigh, which he states is new as of yesterday. He is concerned about recurrent cellulitis. He states his blood sugars have been slightly elevated, this morning in the 180s 02/14/17; he is here for follow-up evaluation. When he was last here there was erythema superiorly from his posterior wound in his anterior thigh. He was prescribed Levaquin however a culture of the wound surface grew MRSA over the phone I changed him to doxycycline on Monday and things seem to be a lot better. 02/24/17; patient missed his appointment on Friday therefore we changed his nurse visit into a physician visit today. Still using silver alginate on the large area of the posterior left thigh. He isn't new area on the dorsal left second toe 03/03/17; actually better today although he admits he has not used his external compression pumps in the last 2 days or so because of work responsibilities over the weekend. 03/10/17; continued improvement. External compression pumps once a day almost all of his wounds have closed on the posterior left calf. Better edema control 03/17/17; in general improved. He still has 3 small open areas on the lateral aspect of his left leg however most of the area on  the posterior part of his leg is epithelialized. He has better edema control. He has an ABD pad under his stocking on the right anterior lower leg although he did not let us look at that today. 03/24/17; patient arrives back in clinic today with no open areas however there are areas on the posterior left calf and anterior left calf that are less than 100% epithelialized. His edema is well controlled in the left lower leg. There is some pitting edema probably lymphedema in the left upper thigh. He uses compression pumps at home once per day. I tried to get him to do this twice a day although he is very reticent. 04/01/2017 -- for the last 2 days he's had significant redness, tenderness and weeping and came in for an urgent visit today. 04/07/17; patient still has 6 more days of doxycycline. He was seen by Dr. Meyer Russel last Wednesday for cellulitis involving the posterior aspect, lateral aspect of his Involving his heel. For the most part he is better there is less erythema and less weeping. He has been on his feet for 12 hours o2 over the weekend. Using his compression pumps once a day 04/14/17 arrives today with continued improvement. Only one area on the posterior left calf that is not fully epithelialized. He has intense bilateral venous inflammation associated with his chronic venous insufficiency disease and secondary lymphedema. We have been using silver alginate to the left posterior calf wound In passing he tells [place] today that the right leg but we have not seen in quite some time has an open area on it but he doesn't want [place] to look at this today states he will show this to [place] next week. 04/21/17; there is no open area on his left leg although he still reports some weeping edema. He showed [place] his right leg today which is the first time  we've seen this leg in a long time. He has a large area of open wound on the right leg anteriorly healthy granulation. Quite a bit of swelling in the right leg and  some degree of venous inflammation. He told us about the right leg in passing last week but states that deterioration in the right leg really only happened over the weekend 04/28/17; there is no open area on the left leg although there is an irritated part on the posterior which is like a wrap injury. The wound on the right leg which was new from last week at least to [place] is a lot better. 05/05/17; still no open area on the left leg. Patient is using his new compression stocking which seems to be doing a good job of controlling the edema. He states he is using his compression pumps once per day. The right leg still has an open wound although it is better in terms of surface area. Required debridement. A lot of pain in the posterior right Achilles marked tenderness. Usually this type of presentation this patient gives concern for an active cellulitis 05/12/17; patient arrives today with his major wound from last week on the right lateral leg somewhat better. Still requiring debridement. He was using his compression stocking on the left leg however that is reopened with superficial wounds anteriorly he did not have an open wound on this leg previously. He is still using his juxta light's once daily at night. He cannot find the time to do this in the morning as he has to be at work by 7 AM 05/19/17; right lateral leg wound looks improved. No debridement required. The concerning area is on the left posterior leg which appears to almost have a subcutaneous hemorrhagic component to it. We've been using silver alginate to all the wounds 05/26/17; the right lateral leg wound continues to look improved. However the area on the left posterior calf is a tightly adherent surface. [place] using silver alginate. Because of the weeping edema in his legs there is very little good alternatives. 06/02/17; the patient left here last week looking quite good. Major wound on the left posterior calf and a small one on the right  lateral calf. Both of these look satisfactory. He tells me that by Wednesday he had noted increased pain in the left leg and drainage. He called on Thursday and Friday to get an appointment here but we were blocked. He did not go to urgent care or his primary physician. He thinks he had a fever on Thursday but did not actually take his temperature. He has not been using his compression pumps on the left leg because of pain. I advised him to go to the emergency room today for IV antibiotics for stents of left leg cellulitis but he has refused I have asked him to take 2 days off work to keep his leg elevated and he has refused this as well. In view of this I'm going to call him and Augmentin and doxycycline. 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 However after a detailed discussion regarding the risk benefits and alternatives he refuses to get admitted to the hospital. With no other recourse I will continue him on oral antibiotics as before and hopefully  he'll have his infectious disease consultation this week. 06/16/2017 -- the patient was seen today by the nurse practitioner at infectious disease Ms. Dixon. 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 He has also been advise Lotrimin cream locally. she also had wise IV antibiotics for his condition if it flares up 06/23/17; patient arrives today with drainage bilaterally although the remaining wound on the left posterior calf after cleaning up today "highlighter yellow drainage" did not look too bad. Unfortunately he has had  breakdown on the right anterior leg [previously this leg had not been open and he is using a black stocking] he went to see infectious disease and is been put on clindamycin 150 mg daily, I did not verify the dose although I'm not familiar with using clindamycin in this dosing range, perhaps for prophylaxisoo 06/27/17; I brought this patient back today to follow-up on the wound deterioration on the right lower leg together with surrounding cellulitis. I started him on doxycycline 4 days ago. This area looks better however he comes in today with intense cellulitis on the medial part of his left thigh. This is not have a wound in this area. Extremely tender. We've been using silver alginate to the wounds on the right lower leg left lower leg with bilateral 4 layer compression he is using his external compression pumps once a day 07/04/17; patient's left medial thigh cellulitis looks better. He has not been using his compression pumps as his insert said it was contraindicated with cellulitis. His right leg continues to make improvements all the wounds are still open. We only have one remaining wound on the left posterior calf. Using silver alginate to all open areas. He is on doxycycline which I started a week ago and should be finishing I gave him Augmentin after Thursday's visit for the severe cellulitis on the left medial thigh which fortunately looks better 07/14/17; the patient's left medial thigh cellulitis has resolved. The cellulitis in his right lower calf on the right also looks better. All of his wounds are stable to improved we've been using silver alginate he has completed the antibiotics I have given him. He has clindamycin 150 mg once a day prescribed by infectious disease for prophylaxis, I've advised him to start this now. We have been using bilateral Unna boots over silver alginate to the wound areas 07/21/17; the patient is been to see infectious disease who noted his recurrent problems  with cellulitis. He was not able to tolerate prophylactic clindamycin therefore he is on amoxicillin 500 twice a day. He also had a second daily dose of Lasix added By Dr. Oneta RackMcKeown but he is not taking this. Nor is he being completely compliant with his compression pumps a especially not this week. He has 2 remaining wounds one on the right posterior lateral lower leg and one on the left posterior medial lower leg. 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 The patient has Unna boots bilaterally. 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 He has bilateral Unna boots for compression and silver alginate to his wounds. Arrives today with his legs looking  as good as I have seen him in quite some time. Not surprisingly his wounds look better as well with improvement on the right lateral leg venous insufficiency wound and also the left medial leg. He is still using the compression pumps once a day 08/11/17; both legs appear to be doing better wounds on the right lateral and left medial legs look better. Skin on the right leg quite good. He is been using silver alginate as the primary dressing. I'm going to use Anasept gel calcium alginate and maintain all the secondary dressings 08/18/17; the patient continues to actually do quite well. The area on his right lateral leg is just about closed the left medial also looks better although it is still moist in this area. His edema is well controlled we have been using Anasept gel with calcium alginate and the usual secondary dressings, 4 layer compression and once daily use of his compression pumps "always been able to manage 09/01/17; the patient continues to do reasonably well in spite of his trip to [place]. The area on the right lateral  leg is epithelialized. Left is much better but still open. He has more edema and more chronic erythema on the left leg [venous inflammation] 09/08/17; he arrives today with no open wound on the right lateral leg and decently controlled edema. Unfortunately his left leg is not nearly as in his good situation as last week.he apparently had increasing edema starting on Saturday. He edema soaked through into his foot so used a plastic bag to walk around his home. The area on the medial right leg which was his open area is about the same however he has lost surface epithelium on the left lateral which is new and he has significant pain in the Achilles area of the left foot. He is already on amoxicillin chronically for prophylaxis of cellulitis in the left leg 09/15/17; he is completed a week of doxycycline and the cellulitis in the left posterior leg and Achilles area is as usual improved. He still has a lot of edema and fluid soaking through his dressings. There is no open wound on the right leg. He saw infectious disease NP today 09/22/17;As usual 1 we transition him from our compression wraps to his stockings things did not go well. He has several small open areas on the right leg. He states this was caused by the compression wrap on his skin although he did not wear this with the stockings over them. He has several superficial areas on the left leg medially laterally posteriorly. He does not have any evidence of active cellulitis especially involving the left Achilles The patient is traveling from [facility] Saturday going to [facility]. He states he isn't attempting to get an appointment with a heel objects wound center there to change his dressings. I am not completely certain whether this will work 10/06/17; the patient came in on Friday for a nurse visit and the nurse reported that his legs actually look quite good. He arrives in clinic today for his regular follow-up visit. He has a new wound on  his left third toe over the PIP probably caused by friction with his footwear. He has small areas on the left leg and a very superficial but epithelialized area on the right anterior lateral lower leg. Other than that his legs look as good as I've seen him in quite some time. We have been using silver alginate Review of systems; no chest pain no shortness of breath other than  this a 10 point review of systems negative 10/20/17; seen by Dr. Meyer Russel last week. He had taken some antibiotics [doxycycline] that he had left over. Dr. Meyer Russel thought he had candida infection and declined to give him further antibiotics. He has a small wound remaining on the right lateral leg several areas on the left leg including a larger area on the left posterior several left medial and anterior and a small wound on the left lateral. The area on the left dorsal third toe looks a lot better. ROS; Gen.; no fever, respiratory no cough no sputum Cardiac no chest pain other than this 10 point review of system is negative 10/30/17; patient arrives today having fallen in the bathtub 3 days ago. It took him a while to get up. He has pain and maceration in the wounds on his left leg which have deteriorated. He has not been using his pumps he also has some maceration on the right lateral leg. 11/03/17; patient continues to have weeping edema especially in the left leg. This saturates his dressings which were just put on on 12/27. As usual the doxycycline seems to take care of the cellulitis on his lower leg. He is not complaining of fever, chills, or other systemic symptoms. He states his leg feels a lot better on the doxycycline I gave him empirically. He also apparently gets injections at his primary doctor's officeo Rocephin for cellulitis prophylaxis. I didn't ask him about his compression pump compliance today I think that's probably marginal. Arrives in the clinic with all of his dressings primary and secondary macerated full  of fluid and he has bilateral edema 11/10/17; the patient's right leg looks some better although there is still a cluster of wounds on the right lateral. The left leg is inflamed with almost circumferential skin loss medially to laterally although we are still maintaining anteriorly. He does not have overt cellulitis there is a lot of drainage. He is not using compression pumps. We have been using silver alginate to the wound areas, there are not a lot of options here 11/17/17; the patient's right leg continues to be stable although there is still open wounds, better than last week. The inflammation in the left leg is better. Still loss of surface layer epithelium especially posteriorly. There is no overt cellulitis in the amount of edema and his left leg is really quite good, tells me he is using his compression pumps once a day. 11/24/17; patient's right leg has a small superficial wound laterally this continues to improve. The inflammation in the left leg is still improving however we have continuous surface layer epithelial loss posteriorly. There is no overt cellulitis in the amount of edema in both legs is really quite good. He states he is using his compression pumps on the left leg once a day for 5 out of 7 days 12/01/17; very small superficial areas on the right lateral leg continue to improve. Edema control in both legs is better today. He has continued loss of surface epithelialization and left posterior calf although I think this is better. We have been using silver alginate with large number of absorptive secondary dressings 4 layer on the left Unna boot on the right at his request. He tells me he is using his compression pumps once a day 12/08/17; he has no open area on the right leg is edema control is good here. ooOn the left leg however he has marked erythema and tenderness breakdown of skin. He has what appears to  be a wrap injury just distal to the popliteal fossa. This is the pattern of  his recurrent cellulitis area and he apparently received penicillin at his primary physician's office really worked in my view but usually response to doxycycline given it to him several times in the past 12/15/17; the patient had already deteriorated last Friday when he came in for his nurse check. There was swelling erythema and breakdown in the right leg. He has much worse skin breakdown in the left leg as well multiple open areas medially and posteriorly as well as laterally. He tells me he has been using his compression pumps but tells me he feels that the drainage out of his leg is worse when he uses a compression pumps. T be fair to him he is been saying this o for a while however I don't know that I have really been listening to this. I wonder if the compression pumps are working properly 12/22/17;. Once again he arrives with severe erythema, weeping edema from the left greater than right leg. Noncompliance with compression pumps. New this visit he is complaining of pain on the lateral aspect of the right leg and the medial aspect of his right thigh. He apparently saw his cardiologist Dr. [name] who was ordered an echocardiogram area and I think this is a step in the right direction 12/25/17; started his doxycycline Monday night. There is still intense erythema of the right leg especially in the anterior thigh although there is less tenderness. The erythema around the wound on the right lateral calf also is less tender. He still complaining of pain in the left heel. His wounds are about the same right lateral left medial left lateral. Superficial but certainly not close to closure. He denies being systemically unwell no fever chills no abdominal pain no diarrhea 12/29/17; back in follow-up of his extensive right calf and right thigh cellulitis. I added amoxicillin to cover possible doxycycline resistant strep. This seems to of done the trick he is in much less pain there is much less erythema and  swelling. He has his echocardiogram at 11:00 this morning. X-ray of the left heel was also negative. 01/05/18; the patient arrived with his edema under much better control. Now that he is retired he is able to use his compression pumps daily and sometimes twice a day per the patient. He has a wound on the right leg the lateral wound looks better. Area on the left leg also looks a lot better. He has no evidence of cellulitis in his bilateral thighs I had a quick peak at his echocardiogram. He is in normal ejection fraction and normal left ventricular function. He has moderate pulmonary hypertension moderately reduced right ventricular function. One would have to wonder about chronic sleep apnea although he says he doesn't snore. He'll review the echocardiogram with his cardiologist. 01/12/18; the patient arrives with the edema in both legs under exemplary control. He is using his compression pumps daily and sometimes twice daily. His wound on the right lateral leg is just about closed. He still has some weeping areas on the posterior left calf and lateral left calf although everything is just about closed here as well. I have spoken with Aldean Baker who is the patient's nurse practitioner and infectious disease. She was concerned that the patient had not understood that the parenteral penicillin injections he was receiving for cellulitis prophylaxis was actually benefiting him. I don't think the patient actually saw that I would tend to agree we were  certainly dealing with less infections although he had a serious one last month. 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 He is healed. He'll be placed in juxtalite compression wraps and increase his lymphedema pumps to twice daily. We will follow up again next week to ensure there are no issues with the new regiment. 01/20/18-he is here for evaluation of bilateral lower extremity weeping edema. Yesterday he was  placed in compression wrap to the right lower extremity and compression stocking to left lower shrubbery. He states he uses lymphedema pumps last night and again this morning and noted a blister to the left lower extremity. On exam he was noted to have drainage to the right lower extremity. He will be placed in Unna boots bilaterally and follow-up next week 01/26/18; patient was actually discharged a week ago to his own juxta light stockings only to return the next day with bilateral lower extremity weeping edema.he was placed in bilateral Unna boots. He arrives today with pain in the back of his left leg. There is no open area on the right leg however there is a linear/wrap injury on the left leg and weeping edema on the left leg posteriorly. I spoke with infectious disease about 10 days ago. They were disappointed that the patient elected to discontinue prophylactic intramuscular penicillin shots as they felt it was particularly beneficial in reducing the frequency of his cellulitis. I discussed this with the patient today. He does not share this view. He'll definitely need antibiotics today. Finally he is traveling to [place] and trauma leaving this Saturday and returning a week later and he does not travel with his pumps. He is going by car 01/30/18; patient was seen 4 days ago and brought back in today for review of cellulitis in the left leg posteriorly. I put him on amoxicillin this really hasn't helped as much as I might like. He is also worried because he is traveling to [facility] trauma by car. Finally we will be rewrapping him. There is no open area on the right leg over his left leg has multiple weeping areas as usual 02/09/18; The same wrap on for 10 days. He did not pick up the last doxycycline I prescribed for him. He apparently took 4 days worth he already had. There is nothing open on his right leg and the edema control is really quite good. He's had damage in the left leg medially and  laterally especially probably related to the prolonged use of Unna boots 02/12/18; the patient arrived in clinic today for a nurse visit/wrap change. He complained of a lot of pain in the left posterior calf. He is taking doxycycline that I previously prescribed for him. Unfortunately even though he used his stockings and apparently used to compression pumps twice a day he has weeping edema coming out of the lateral part of his right leg. This is coming from the lower anterior lateral skin area. 02/16/18; the patient has finished his doxycycline and will finish the amoxicillin 2 days. The area of cellulitis in the left calf posteriorly has resolved. He is no longer having any pain. He tells me he is using his compression pumps at least once a day sometimes twice. 02/23/18; the patient finished his doxycycline and Amoxil last week. On Friday he noticed a small erythematous circle about the size of a quarter on the left lower leg just above his ankle. This rapidly expanded and he now has erythema on the lateral and posterior part  of the thigh. This is bright red. Also has an area on the dorsal foot just above his toes and a tender area just below the left popliteal fossa. He came off his prophylactic penicillin injections at his own insistence one or 2 months ago. This is obviously deteriorated since then 03/02/18; patient is on doxycycline and Amoxil. Culture I did last week of the weeping area on the back of his left calf grew group B strep. I have therefore renewed the amoxicillin 500 3 times a day for a further week. He has not been systemically unwell. Still complaining of an area of discomfort right under his left popliteal fossa. There is no open wound on the right leg. He tells me that he is using his pumps twice a day on most days 03/09/18; patient arrives in clinic today completing his amoxicillin today. The cellulitis on his left leg is better. Furthermore he tells me that he had  intramuscular penicillin shots that his primary care office today. However he also states that the wrap on his right leg fell down shortly after leaving clinic last week. He developed a large blister that was present when he came in for a nurse visit later in the week and then he developed intense discomfort around this area.He tells me he is using his compression pumps 03/16/18; the patient has completed his doxycycline. The infectious part of this/cellulitis in the left heel area left popliteal area is a lot better. He has 2 open areas on the right calf. Still areas on the left calf but this is a lot better as well. 03/24/18; the patient arrives complaining of pain in the left popliteal area again. He thinks some of this is wrap injury. He has no open area on the right leg and really no open area on the left calf either except for the popliteal area. He claims to be compliant with the compression pumps 03/31/18; I gave him doxycycline last week because of cellulitis in the left popliteal area. This is a lot better although the surface epithelium is denuded off and response to this. He arrives today with uncontrolled edema in the right calf area as well as a fingernail injury in the right lateral calf. There is only a few open areas on the left 04/06/18; I gave him amoxicillin doxycycline over the last 2 weeks that the amoxicillin should be completing currently. He is not complaining of any pain or systemic symptoms. The only open areas see has is on the right lateral lower leg paradoxically I cannot see anything on the left lower leg. He tells me he is using his compression pumps twice a day on most days. Silver alginate to the wounds that are open under 4 layer compression 04/13/18; he completed antibiotics and has no new complaints. Using his compression pumps. Silver alginate that anything that's opened 04/20/18; he is using his compression pumps religiously. Silver alginate 4 layer compression anything  that's opened. He comes in today with no open wounds on the left leg but 3 on the right including a new one posteriorly. He has 2 on the right lateral and one on the right posterior. He likes Unna boots on the right leg for reasons that aren't really clear we had the usual 4 layer compression on the left. It may be necessary to move to the 4 layer compression on the right however for now I left them in the Unna boots 04/27/18; he is using his compression pumps at least once a day.  He has still the wounds on the right lateral calf. The area right posteriorly has closed. He does not have an open wound on the left under 4 layer compression however on the dorsal left foot just proximal to the toes and the left third toe 2 small open areas were identified 05/11/18; he has not uses compression pumps. The areas on the right lateral calf have coalesced into one large wound necrotic surface. On the left side he has one small wound anteriorly however the edema is now weeping out of a large part of his left leg. He says he wasn't using his pumps because of the weeping fluid. I explained to him that this is the time he needs to pump more 05/18/18; patient states he is using his compression pumps twice a day. The area on the right lateral large wound albeit superficial. On the left side he has innumerable number of small new wounds on the left calf particularly laterally but several anteriorly and medially. All these appear to have healthy granulated base these look like the remnants of blisters however they occurred under compression. The patient arrives in clinic today with his legs somewhat better. There is certainly less edema, less multiple open areas on the left calf and the right anterior leg looks somewhat better as well superficial and a little smaller. However he relates pain and erythema over the last 3-4 days in the thigh and I looked at this today. He has not been systemically unwell no fever no chills no  change in blood sugar values 05/25/18; comes in today in a better state. The severe cellulitis on his left leg seems better with the Keflex. Not as tender. He has not been systemically unwell ooHard to find an open wound on the left lower leg using his compression pumps twice a day ooThe confluent wounds on his right lateral calf somewhat better looking. These will ultimately need debridement I didn't do this today. 06/01/18; the severe cellulitis on the left anterior thigh has resolved and he is completed his Keflex. ooThere is no open wound on the left leg however there is a superficial excoriation at the base of the third toe dorsally. Skin on the bottom of his left foot is macerated looking. ooThe left the wounds on the lateral right leg actually looks some better although he did require debridement of the top half of this wound area with an open curet 06/09/18 on evaluation today patient appears to be doing poorly in regard to his right lower extremity in particular this appears to likely be infected he has very thick purulent discharge along with a bright green tent to the discharge. This makes me concerned about the possibility of pseudomonas. He's also having increased discomfort at this point on evaluation. Fortunately there does not appear to be any evidence of infection spreading to the other location at this time. 06/16/18 on evaluation today patient appears to actually be doing fairly well. His ulcer has actually diminished in size quite significantly at this point which is good news. Nonetheless he still does have some evidence of infection he did see infectious disease this morning before coming here for his appointment. I did review the results of their evaluation and their note today. They did actually have him discontinue the Cipro and initiate treatment with linezolid at this time. He is doing this for the next seven days and they recommended a follow-up in four months with them. He  is the keep a log of the need  for intermittent antibiotic therapy between now and when he falls back up with infectious disease. This will help them gaze what exactly they need to do to try and help them out. 06/23/18; the patient arrives today with no open wounds on the left leg and left third toe healed. He is been using his compression pumps twice a day. On the right lateral leg he still has a sizable wound but this is a lot better than last time I saw this. 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 Has been using silver alginate under 4 layer compression 06/30/18; the only open wound he has is on the right lateral leg and this looks healthy. No debridement is required. We have been using silver alginate. He does not have an open wound on the left leg. There is apparently some drainage from the dorsal proximal third toe on the left although I see no open wound here. 07/03/18 on evaluation today patient was actually here just for a nurse visit rapid change. However when he was here on Wednesday for his rat change due to having been healed on the left and then developing blisters we initiated the wrap again knowing that he would be back today for [place] to reevaluate and see were at. Unfortunately he has developed some cellulitis into the proximal portion of his right lower extremity even into the region of his thigh. 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 He was placed on one as what at that point. Nonetheless he is done with that and has been tolerating it well otherwise. Doxycycline which in the past really did not seem to be effective for him. Nonetheless I think the best option may be for [place] to definitely reinitiate the antibiotics for a longer period of time. 07/07/18; since I last saw this patient a week ago he has had a difficult time. At that point he  did not have an open wound on his left leg. We transitioned him into juxta light stockings. He was apparently in the clinic the next day with blisters on the left lateral and left medial lower calf. He also had weeping edema fluid. He was put back into a compression wrap. He was also in the clinic on Friday with intense erythema in his right thigh. Per the patient he was started on Bactrim however that didn't work at all in terms of relieving his pain and swelling. He has taken 3 doxycycline that he had left over from last time and that seems to of helped. He has blistering on the right thigh as well. 07/14/18; the erythema on his right thigh has gotten better with doxycycline that he is finishing. 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 Presumably this cellulitis in the thigh was not related to that although I think this is a bit concerning going forward. He still has an area on the right lateral calf the blister on the right medial calf just below the knee that was discussed above. On the left 2 small open areas left medial and left lateral. Edema control is adequate. He is using his compression pumps twice a day 07/20/18; continued improvement in the condition of both legs especially the edema in his bilateral thighs. He tells me he is been losing weight  through a combination of diet and exercise. He is using his compression pumps twice a day. So overall she made to the remaining wounds 07/27/2018; continued improvement in condition of both legs. His edema is well controlled. The area on the right lateral leg is just about closed he had one blisters show up on the medial left upper calf. We have him in 4 layer compression. He is going on a 10-day trip to [place], [place] and [place]. He will be driving. He wants to wear Unna boots because of the lessening amount of constriction. He will not use compression pumps while  he is away 08/05/18 on evaluation today patient actually appears to be doing decently well all things considered in regard to his bilateral lower extremities. The worst ulcer is actually only posterior aspect of his left lower extremity with a four layer compression wrap cut into his leg a couple weeks back. He did have a trip and actually had [employer] for the trip that he is worn since he was last here. Nonetheless he feels like the [employer] actually do better for him his swelling is up a little bit but he also with his trip was not taking his Lasix on a regular set schedule like he was supposed to be. He states that obviously the reason being that he cannot drive and keep going without having to urinate too frequently which makes it difficult. He did not have his pumps with him while he was away either which I think also maybe playing a role here too. 08/13/2018; the patient only has a small open wound on the right lateral calf which is a big improvement in the last month or 2. He also has the area posteriorly just below the posterior fossa on the left which I think was a wrap injury from several weeks ago. He has no current evidence of cellulitis. He tells me he is back into his compression pumps twice a day. He also tells me that while he was at the laundromat somebody stole a section of his extremitease stockings 08/20/2018; back in the clinic with a much improved state. He only has small areas on the right lateral mid calf which is just about healed. This was is more substantial area for quite a prolonged period of time. He has a small open area on the left anterior tibia. The area on the posterior calf just below the popliteal fossa is closed today. He is using his compression pumps twice a day 08/28/2018; patient has no open wound on the right leg. He has a smattering of open areas on the calf with some weeping lymphedema. More problematically than that it looks as though his wraps  of slipped down in his usual he has very angry upper area of edema just below the right medial knee and on the right lateral calf. He has no open area on his feet. The patient is traveling to [facility] next week. I will send him in an antibiotic. We will continue to wrap the right leg. We ordered extremitease stockings for him last week and I plan to transition the right leg to a stocking when he gets home which will be in 10 days time. As usual he is very reluctant to take his pumps with him when he travels 09/07/2018; patient returns from [facility]. He shows me a picture of his left leg in the mid part of his trip last week with intense fire engine erythema. The  picture look bad enough I would have considered sending him to the hospital. Instead he went to the wound care center in [facility]. They did not prescribe him antibiotics but he did take some doxycycline he had leftover from a previous visit. I had given him trimethoprim sulfamethoxazole before he left this did not work according to the patient. This is resulted in some improvement fortunately. He comes back with a large wound on the left posterior calf. Smaller area on the left anterior tibia. Denuded blisters on the dorsal left foot over his toes. Does not have much in the way of wounds on the right leg although he does have a very tender area on the right posterior area just below the popliteal fossa also suggestive of infection. He promises me he is back on his pumps twice a day 09/15/2018; the intense cellulitis in his left lower calf is a lot better. The wound area on the posterior left calf is also so better. However he has reasonably extensive wounds on the dorsal aspect of his second and third toes and the proximal foot just at the base of the toes. There is nothing open on the right leg 09/22/2018; the patient has excellent edema control in his legs bilaterally. He is using his external compression pumps twice a  day. He has no open area on the right leg and only the areas in the left foot dorsally second and third toe area on the left side. He does not have any signs of active cellulitis. 10/06/2018; the patient has good edema control bilaterally. He has no open wound on the right leg. There is a blister in the posterior aspect of his left calf that we had to deal with today. He is using his compression pumps twice a day. There is no signs of active cellulitis. We have been using silver alginate to the wound areas. He still has vulnerable areas on the base of his left first second toes dorsally He has a his extremities stockings and we are going to transition him today into the stocking on the right leg. He is cautioned that he will need to continue to use the compression pumps twice a day. If he notices uncontrolled edema in the right leg he may need to go to 3 times a day. 10/13/2018; the patient came in for a nurse check on Friday he has a large flaccid blister on the right medial calf just below the knee. We unroofed this. He has this and a new area underneath the posterior mid calf which was undoubtedly a blister as well. He also has several small areas on the right which is the area we put his extremities stocking on. 10/19/2018; the patient went to see infectious disease this morning I am not sure if that was a routine follow-up in any case the doxycycline I had given him was discontinued and started on linezolid. He has not started this. It is easy to look at his left calf and the inflammation and think this is cellulitis however he is very tender in the tissue just below the popliteal fossa and I have no doubt that there is infection going on here. He states the problem he is having is that with the compression pumps the edema goes down and then starts walking the wrap falls down. We will see if we can adhere this. He has 1 or 2 minuscule open areas on the right still areas that are weeping on the  posterior left calf,  the base of his left second and third toes 10/26/18; back today in clinic with quite of skin breakdown in his left anterior leg. This may have been infection the area below the popliteal fossa seems a lot better however tremendous epithelial loss on the left anterior mid tibia area over quite inexpensive tissue. He has 2 blisters on the right side but no other open wound here. 10/29/2018; came in urgently to see [place] today and we worked him in for review. He states that the 4 layer compression on the right leg caused pain he had to cut it down to roughly his mid calf this caused swelling above the wrap and he has blisters and skin breakdown today. As a result of the pain he has not been using his pumps. Both legs are a lot more edematous and there is a lot of weeping fluid. 11/02/18; arrives in clinic with continued difficulties in the right leg> left. Leg is swollen and painful. multiple skin blisters and new open areas especially laterally. He has not been using his pumps on the right leg. 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 He is not systemically unwell. 11/09/2018; the patient claims he is being compliant with his pumps. He is finished the doxycycline I gave him last week. Culture I did of the wound on the right lateral leg showed a few very resistant methicillin staph aureus. This was resistant to doxycycline. Nevertheless he states the pain in the leg is a lot better which makes me wonder if the cultured organism was not really what was causing the problem nevertheless this is a very dangerous organism to be culturing out of any wound. His right leg is still a lot larger than the left. He is using an Radio broadcast assistant on this area, he blames a 4-layer compression for causing the original skin breakdown which I doubt is true however I cannot talk him out of it. We have been using silver alginate to all of these areas which were  initially blisters 11/16/2018; patient is being compliant with his external compression pumps at twice a day. Miraculously he arrives in clinic today with absolutely no open wounds. He has better edema control on the left where he has been using 4 layer compression versus wound of wounds on the right and I pointed this out to him. There is no inflammation in the skin in his lower legs which is also somewhat unusual for him. There is no open wounds on the dorsal left foot. He has extremitease stockings at home and I have asked him to bring these in next week. 11/25/18 patient's lower extremity on examination today on the left appears for the most part to be wound free. He does have an open wound on the lateral aspect of the right lower extremity but this is minimal compared to what I've seen in past. He does request that we go ahead and wrap the left leg as well even though there's nothing open just so hopefully it will not reopen in short order. 1/28; patient has superficial open wounds on the right lateral calf left anterior calf and left posterior calf. His edema control is adequate. 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 We have been using silver alginate as the primary dressing. He claims compliance with his compression pumps 2/4; patient has superficial open wounds on numerous areas of his left  calf and again one on the left dorsal foot. The areas on the right lateral calf have healed. The cellulitis that I gave him doxycycline for last week is also resolved this was mostly on the left anterior calf just below the tibial tuberosity. His edema looks fairly well-controlled. He tells me he went to see his primary doctor today and had blood work ordered 2/11; once again he has several open areas on the left calf left tibial area. Most of these are small and appear to have healthy granulation. He does not  have anything open on the right. The edema and control in his thighs is pretty good which is usually a good indication he has been using his pumps as requested. 2/18; he continues to have several small areas on the left calf and left tibial area. Most of these are small healthy granulation. We put him in his stocking on the right leg last week and he arrives with a superficial open area over the right upper tibia and a fairly large area on the right lateral tibia in similar condition. His edema control actually does not look too bad, he claims to be using his compression pumps twice a day 2/25. Continued small areas on the left calf and left tibial area. New areas especially on the right are identified just below the tibial tuberosity and on the right upper tibia itself. There are also areas of weeping edema fluid even without an obvious wound. He does not have a considerable degree of lymphedema but clearly there is more edema here than his skin can handle. He states he is using the pumps twice a day. We have an Unna boot on the right and 4 layer compression on the left. 3/3; he continues to have an area on the right lateral calf and right posterior calf just below the popliteal fossa. There is a fair amount of tenderness around the wound on the popliteal fossa but I did not see any evidence of cellulitis, could just be that the wrap came down and rubbed in this area. ooHe does not have an open area on the left leg however there is an area on the left dorsal foot at the base of the third toe ooWe have been using silver alginate to all wound areas 3/10; he did not have an open area on his left leg last time he was here a week ago. T oday he arrives with a horizontal wound just below the tibial tuberosity and an area on the left lateral calf. He has intense erythema and tenderness in this area. The area is on the right lateral calf and right posterior calf better than last week. We have been using  silver alginate as usual 3/18 - Patient returns with 3 small open areas on left calf, and 1 small open area on right calf, the skin looks ok with no significant erythema, he continues the UNA boot on right and 4 layer compression on left. The right lateral calf wound is closed , the right posterior is small area. we will continue silver alginate to the areas. 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 He is been tolerating the dressing changes without complication. Fortunately she has made excellent improvement in regard to the overall status of his wounds. Unfortunately every time we cease wrapping him  he ends up reopening in causing more significant issues at that point. Again I'm unsure of the best direction to take although I think the lymphedema clinic may be appropriate for him. 02/03/19 on evaluation today patient appears to be doing well in regard to the wounds that we saw him for last week unfortunately he has a new area on the proximal portion of his right medial/posterior lower extremity where the wrap somewhat slowed down and caused swelling and a blister to rub and open. Unfortunately this is the only opening that he has on either leg at this point. 02/17/19 on evaluation today patient's bilateral lower extremities appear to be doing well. He still completely healed in regard to the left lower extremity. In regard to the right lower extremity the area where the wrap and slid down and caused the blister still seems to be slightly open although this is dramatically better than during the last evaluation two weeks ago. I'm very pleased with the way this stands overall. 03/03/19 on evaluation today patient appears to be doing well in regard to his right lower extremity in general although he did have a  new blister open this does not appear to be showing any evidence of active infection at this time. Fortunately there's No fevers, chills, nausea, or vomiting noted at this time. Overall I feel like he is making good progress it does feel like that the right leg will we perform the [employer] seems to do with a bit better than three layer wrap on the left which slid down on him. We may switch to doing bilateral in the book wraps. 5/4; I have not seen Mr. Naas in quite some time. According to our case manager he did not have an open wound on his left leg last week. He had 1 remaining wound on the right posterior medial calf. He arrives today with multiple openings on the left leg probably were blisters and/or wrap injuries from Unna boots. I do not think the Unna boot's will provide adequate compression on the left. I am also not clear about the frequency he is using the compression pumps. 03/17/19 on evaluation today patient appears to be doing excellent in regard to his lower extremities compared to last week's evaluation apparently. He had gotten significantly worse last week which is unfortunate. The [employer] wrap on the left did not seem to do very well for him at all and in fact it didn't control his swelling significantly enough he had an additional outbreak. Subsequently we go back to the four layer compression wrap on the left. This is good news. At least in that he is doing better and the wound seem to be killing him. He still has not heard anything from the lymphedema clinic. 03/24/19 on evaluation today patient actually appears to be doing much better in regard to his bilateral lower Trinity as compared to last week when I saw him. Fortunately there's no signs of active infection at this time. He has been tolerating the dressing changes without complication. Overall I'm extremely pleased with the progress and appearance in general. 04/07/19 on evaluation today patient appears to be doing well  in regard to his bilateral lower extremities. His swelling is significantly down from where it was previous. With that being said he does have a couple blisters still open at this point but fortunately nothing that seems to be too severe and again the majority of the larger openings has healed at this time. 04/14/19 on evaluation  today patient actually appears to be doing quite well in regard to his bilateral lower extremities in fact I'm not even sure there's anything significantly open at this time at any site. Nonetheless he did have some trouble with these wraps where they are somewhat irritating him secondary to the fact that he has noted that the graph wasn't too close down to the end of this foot in a little bit short as well up to his knee. Otherwise things seem to be doing quite well. 04/21/19 upon evaluation today patient's wound bed actually showed evidence of being completely healed in regard to both lower extremities which is excellent news. There does not appear to be any signs of active infection which is also good news. I'm very pleased in this regard. No fevers, chills, nausea, or vomiting noted at this time. 04/28/19 on evaluation today patient appears to be doing a little bit worse in regard to both lower extremities on the left mainly due to the fact that when he went infection disease the wrap was not wrapped quite high enough he developed a blister above this. On the right he is a small open area of nothing too significant but again this is continuing to give him some trouble he has been were in the Velcro compression that he has at home. 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 He's been tolerating the dressing changes without complication. Fortunately there's no signs of active infection at this time. No fevers, chills, nausea, or vomiting noted at this time. We have been trying to get an appointment with her  lymphedema clinic in [facility] but unfortunately nobody can get them on phone with not been able to even fax information over the patient likewise is not been able to get in touch with them. Overall I'm not sure exactly what's going on here with to reach out again today. 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 Still having a lot of drainage unfortunately. He tells me especially in the left but even on the right. There's no signs of active infection which is good news we've been using so ratcheted up to this point. 05/19/19 on evaluation today patient actually appears to be doing quite well with regard to his left lower extremity which is great news. Fortunately in regard to the right lower extremity has an issues with his wrap and he subsequently did remove this from what I'm understanding. Nonetheless long story short is what he had rewrapped once he removed it subsequently had maggots underneath this wrap whenever he came in for evaluation today. With that being said they were obviously completely cleaned away by the nursing staff. The visit today which is excellent news. However he does appear to potentially have some infection around the right ankle region where the maggots were located as well. He will likely require anabiotic therapy today. 05/26/19 on evaluation today patient actually appears to be doing much better in regard to his bilateral lower extremities. I feel like the infection is under much better control. With that being said there were maggots noted when the wrap was removed yet again today. Again this could have potentially been left over from previous although at this time there does not appear to be any signs of significant drainage there was obviously on the wrap some drainage as well this contracted gnats or otherwise. Either  way I do not see anything that appears to be doing  worse in my pinion and in fact I think his drainage has slowed down quite significantly likely mainly due to the fact to his infection being under better control. 06/02/2019 on evaluation today patient actually appears to be doing well with regard to his bilateral lower extremities there is no signs of active infection at this time which is great news. With that being said he does have several open areas more so on the right than the left but nonetheless these are all significantly better than previously noted. 06/09/2019 on evaluation today patient actually appears to be doing well. His wrap stayed up and he did not cause any problems he had more drainage on the right compared to the left but overall I do not see any major issues at this time which is great news. 06/16/2019 on evaluation today patient appears to be doing excellent with regard to his lower extremities the only area that is open is a new blister that can have opened as of today on the medial ankle on the left. Other than this he really seems to be doing great I see no major issues at this point. 06/23/2019 on evaluation today patient appears to be doing quite well with regard to his bilateral lower extremities. In fact he actually appears to be almost completely healed there is a small area of weeping noted of the right lower extremity just above the ankle. Nonetheless fortunately there is no signs of active infection at this time which is good news. No fevers, chills, nausea, vomiting, or diarrhea. 8/24; the patient arrived for a nurse visit today but complained of very significant pain in the left leg and therefore I was asked to look at this. Noted that he did not have an open area on the left leg last week nevertheless this was wrapped. The patient states that he is not been able to put his compression pumps on the left leg because of the discomfort. He has not been systemically unwell 06/30/2019 on evaluation today patient unfortunately  despite being excellent last week is doing much worse with regard to his left lower extremity today. In fact he had to come in for a nurse on Monday where his left leg had to be rewrapped due to excessive weeping Dr. Leanord Hawking placed him on doxycycline at that point. Fortunately there is no signs of active infection Systemically at this time which is good news. 07/07/2019 in regard to the patient's wounds today he actually seems to be doing well with his right lower extremity there really is nothing open or draining at this point this is great news. Unfortunately the left lower extremity is given him additional trouble at this time. There does not appear to be any signs of active infection nonetheless he does have a lot of edema and swelling noted at this point as well as blistering all of which has led to a much more poor appearing leg at this time compared to where it was 2 weeks ago when it was almost completely healed. Obviously this is a little discouraging for the patient. He is try to contact the lymphedema clinic in [place] he has not been able to get through to them. 07/14/2019 on evaluation today patient actually appears to be doing slightly better with regard to his left lower extremity ulcers. Overall I do feel like at least at the top of the wrap that we have been placing this area has healed  quite nicely and looks much better. The remainder of the leg is showing signs of improvement. Unfortunately in the thigh area he still has an open region on the left and again on the right he has been utilizing just a Band-Aid on an area that also opened on the thigh. Again this is an area that were not able to wrap although we did do an Ace wrap to provide some compression that something that obviously is a little less effective than the compression wraps we have been using on the lower portion of the leg. He does have an appointment with the lymphedema clinic in [facility] on Friday. 07/21/2019 on  evaluation today patient appears to be doing better with regard to his lower extremity ulcers. He has been tolerating the dressing changes without complication. Fortunately there is no signs of active infection at this time. No fevers, chills, nausea, vomiting, or diarrhea. I did receive the paperwork from the physical therapist at the lymphedema clinic in [place]. Subsequently I signed off on that this morning and sent that back to him for further progression with the treatment plan. 07/28/2019 on evaluation today patient appears to be doing very well with regard to his right lower extremity where I do not see any open wounds at this point. Fortunately he is feeling great as far as that is concerned as well. In regard to the left lower extremity he has been having issues with still several areas of weeping and edema although the upper leg is doing better his lower leg still I think is going require the compression wrap at this time. No fevers, chills, nausea, vomiting, or diarrhea. 08/04/2019 on evaluation today patient unfortunately is having new wounds on the right lower extremity. Again we have been using Unna boot wrap on that side. We switched him to using his juxta lite wrap at home. With that being said he tells me he has been using it although his legs extremely swollen and to be honest really does not appear that he has been. I cannot know that for sure however. Nonetheless he has multiple new wounds on the right lower extremity at this time. Obviously we will have to see about getting this rewrapped for him today. 08/11/2019 on evaluation today patient appears to be doing fairly well with regard to his wounds. He has been tolerating the dressing changes including the compression wraps without complication. He still has a lot of edema in his upper thigh regions bilaterally he is supposed to be seeing the lymphedema clinic on the 15th of this month once his wraps arrive for the upper part  of his legs. 08/18/2019 on evaluation today patient appears to be doing well with regard to his bilateral lower extremities at this point. He has been tolerating the dressing changes without complication. Fortunately there is no signs of active infection which is also good news. He does have a couple weeping areas on the first and second toe of the right foot he also has just a small area on the left foot upper leg and a small area on the left lower leg but overall he is doing quite well in my opinion. He is supposed to be getting his wraps shortly in fact tomorrow and then subsequently is seeing the lymphedema clinic next Wednesday on the 21st. Of note he is also leaving on the 25th to go on vacation for a week to the beach. For that reason and since there is some uncertainty about what there can be  doing at lymphedema clinic next Wednesday I am get a make an appointment for next Friday here for [place] to see what we need to do for him prior to him leaving for vacation. 10/23; patient arrives in considerable pain predominantly in the upper posterior calf just distal to the popliteal fossa also in the wound anteriorly above the major wound. This is probably cellulitis and he has had this recurrently in the past. He has no open wound on the right side and he has had an Radio broadcast assistant in that area. Finally I note that he has an area on the left posterior calf which by enlarge is mostly epithelialized. This protrudes beyond the borders of the surrounding skin in the setting of dry scaly skin and lymphedema. The patient is leaving for [facility] on Sunday. Per his longstanding pattern, he will not take his compression pumps with him predominantly out of fear that they will be stolen. He therefore asked that we put a Unna boot back on the right leg. He will also contact the wound care center in [facility] to see if they can change his dressing in the mid week. 11/3; patient returned from his vacation to [facility]. He was seen on 1 occasion at their wound care center. They did a 2 layer compression system as they did not have our 4-layer wrap. I am not completely certain what they put on the wounds. They did not change the Unna boot on the right. The patient is also seeing a lymphedema specialist physical therapist in [place]. It appears that he has some compression sleeve for his thighs which indeed look quite a bit better than I am used to seeing. He pumps over these with his external compression pumps. 11/10; the patient has a new wound on the right medial thigh otherwise there is no open areas on the right. He has an area on the left leg posteriorly anteriorly and medially and an area over the left second toe. We have been using silver alginate. He thinks the injury on his thigh is secondary to friction from the compression sleeve he has. 11/17; the patient has a new wound on the right medial thigh last week. He thinks this is because he did not have a underlying stocking for his thigh juxta lite apparatus. He now has this. The area is fairly large and somewhat angry but I do not think he has underlying cellulitis. ooHe has a intact blister on the right anterior tibial area. ooSmall wound on the right great toe dorsally ooSmall area on the medial left calf. 11/30; the patient does not have any open areas on his right leg and we did not take his juxta lite stocking off. However he states that on Friday his compression wrap fell down lodging around his upper mid calf area. As usual this creates a lot of problems for him. He called urgently today to be seen for a nurse visit however the nurse visit turned into a provider visit because of extreme erythema and pain in the left anterior tibia extending laterally and posteriorly. The area that is problematic is extensive 10/06/2019 upon evaluation today patient actually appears to be doing poorly in regard to his left lower extremity. He Dr. Leanord Hawking  did place him on doxycycline this past Monday apparently due to the fact that he was doing much worse in regard to this left leg. Fortunately the doxycycline does seem to be helping. Unfortunately we are still having a very difficult time  getting his edema under any type of control in order to anticipate discharge at some point. The only way were really able to control his lymphedema really is with compression wraps and that has only even seemingly temporary. He has been seeing a lymphedema clinic they are trying to help in this regard but still this has been somewhat frustrating in general for the patient. 10/13/19 on evaluation today patient appears to be doing excellent with regard to his right lower extremity as far as the wounds are concerned. His swelling is still quite extensive unfortunately. He is still having a lot of drainage from the thigh areas bilaterally which is unfortunate. He's been going to lymphedema clinic but again he still really does not have this edema under control as far as his lower extremities are concern. With regard to his left lower extremity this seems to be improving and I do believe the doxycycline has been of benefit for him. He is about to complete the doxycycline. 10/20/2019 on evaluation today patient appears to be doing poorly in regard to his bilateral lower extremities. More in the right thigh he has a lot of irritation at this site unfortunately. In regard to the left lower extremity the wrap was not quite as high it appears and does seem to have caused him some trouble as well. Fortunately there is no evidence of systemic infection though he does have some blue-green drainage which has me concerned for the possibility of Pseudomonas. He tells me he is previously taking Cipro without complications and he really does not care for Levaquin however due to some of the side effects he has. He is not allergic to any medications specifically antibiotics that were aware  of. 10/27/2019 on evaluation today patient actually does appear to be for the most part doing better when compared to last week's evaluation. With that being said he still has multiple open wounds over the bilateral lower extremities. He actually forgot to start taking the Cipro and states that he still has the whole bottle. He does have several new blisters on left lower extremity today I think I would recommend he go ahead and take the Cipro based on what I am seeing at this point. 12/30-Patient comes at 1 week visit, 4 layer compression wraps on the left and Unna boot on the right, primary dressing Xtrasorb and silver alginate. Patient is taking his Cipro and has a few more days left probably 5-6, and the legs are doing better. He states he is using his compressions devices which I believe he has 11/10/2019 on evaluation today patient actually appears to be much better than last time I saw him 2 weeks ago. His wounds are significantly improved and overall I am very pleased in this regard. Fortunately there is no signs of active infection at this time. He is just a couple of days away from completing Cipro. Overall his edema is much better he has been using his lymphedema pumps which I think is also helping at this point. 11/17/2019 on evaluation today patient appears to be doing excellent in regard to his wounds in general. His legs are swollen but not nearly as much as they have been in the past. Fortunately he is tolerating the compression wraps without complication. No fevers, chills, nausea, vomiting, or diarrhea. He does have some erythema however in the distal portion of his right lower extremity specifically around the forefoot and toes there is a little bit of warmth here as well. 11/24/2019 on evaluation today  patient appears to be doing well with regard to his right lower extremity I really do not see any open wounds at this point. His left lower extremity does have several open areas and his  right medial thigh also is open. Other than this however overall the patient seems to be making good progress and I am very pleased at this point. 12/01/2019 on evaluation today patient appears to be doing poorly at this point in regard to his left lower extremity has several new blisters despite the fact that we have him in compression wraps. In fact he had a 4-layer compression wrap, his upper thigh wrapped from lymphedema clinic, and a juxta light over top of the 4 layer compression wrap the lymphedema clinic applied and despite all this he still develop blisters underneath. Obviously this does have me concerned about the fact that unfortunately despite what we are doing to try to get wounds healed he continues to have new areas arise I do not think he is ever good to be at the point where he can realistically just use wraps at home to keep things under control. Typically when we heal him it takes about 1-2 days before he is back in the clinic with severe breakdown and blistering of his lower extremities bilaterally. This is happened numerous times in the past. Unfortunately I think that we may need some help as far as overall fluid overload to kind of limit what we are seeing and get things under better control. 12/08/2019 on evaluation today patient presents for follow-up concerning his ongoing bilateral lower extremity edema. Unfortunately he is still having quite a bit of swelling the compression wraps are controlling this to some degree but he did see Dr. [name] his cardiologist I do have that available for review today as far as the appointment was concerned that was on 12/06/2019. Obviously that she has been 2 days ago. The patient states that he is only been taking the Lasix 80 mg 1 time a day he had told me previously he was taking this twice a day. Nonetheless Dr. [name] recommended this be up to 80 mg 2 times a day for the patient as he did appear to be fluid overloaded. With that being said the  patient states he did this yesterday and he was unable to go anywhere or do anything due to the fact that he was constantly having to urinate. Nonetheless I think that this is still good to be something that is important for him as far as trying to get his edema under control at all things that he is going to be able to just expect his wounds to get under control and things to be better without going through at least a period of time where he is trying to stabilize his fluid management in general and I think increasing the Lasix is likely the first step here. It was also mentioned the possibility that the patient may require metolazone. With that being said he wanted to have the patient take Lasix twice a day first and then reevaluating 2 months to see where things stand. 12/15/2019 upon evaluation today patient appears to be doing regard to his legs although his toes are showing some signs of weeping especially on the left at this point to some degree on the right. There does not appear to be any signs of active infection and overall I do feel like the compression wraps are doing well for him but he has not been able to  take the Lasix at home and the increased dose that Dr. [name] recommended. He tells me that just not go to be feasible for him. Nonetheless I think in this case he should probably send a message to Dr. [name] in order to discuss options from the standpoint of possible admission to get the fluid off or otherwise going forward. 12/22/2019 upon evaluation today patient appears to be doing fairly well with regard to his lower extremities at this point. In fact he would be doing excellent if it was not for the fact that his right anterior thigh apparently had an allergic reaction to adhesive tape that he used. The wound itself that we have been monitoring actually appears to be healed. There is a lot of irritation at this point. 12/29/2019 upon evaluation today patient appears to be doing well  in regard to his lower extremities. His left medial thigh is open and somewhat draining today but this is the only region that is open the right has done much better with the treatment utilizing the steroid cream that I prescribed for him last week. Overall I am pleased in that regard. Fortunately there is no signs of active infection at this time. No fevers, chills, nausea, vomiting, or diarrhea. 01/05/2020 upon evaluation today patient appears to be doing more poorly in regard to his right lower extremity at this point upon evaluation today. Unfortunately he continues to have issues in this regard and I think the biggest issue is controlling his edema. This obviously is not very well controlled at this point is been recommended that he use the Lasix twice a day but he has not been able to do that. Unfortunately I think this is leading to an issue where honestly he is not really able to effectively control his edema and therefore the wounds really are not doing significantly better. I do not think that he is going to be able to keep things under good control unless he is able to control his edema much better. I discussed this again in great detail with him today. 01/12/2020 good news is patient actually appears to be doing quite well today at this point. He does have an appointment with lymphedema clinic tomorrow. His legs appear healed and the toe on the left is almost completely healed. In general I am very pleased with how things stand at this point. 01/19/2020 upon evaluation today patient appears to actually be doing well in regard to his lower extremities there is nothing open at this point. Fortunately he has done extremely well more recently. Has been seeing lymphedema clinic as well. With that being said he has Velcro wraps for his lower legs as well as his upper legs. The only wound really is on his toe which is the right great toe and this is barely anything even there. With all that being said I  think it is good to be appropriate today to go ahead and switch him over to the Velcro compression wraps. 01/26/2020 upon evaluation today patient appears to be doing worse with regard to his lower extremities after last week switch him to Velcro compression wraps. Unfortunately he lasted less than 24 hours he did not have the sock portion of his Velcro wrap on the left leg and subsequently developed a blister underneath the Velcro portion. Obviously this is not good and not what we were looking for at this point. He states the lymphedema clinic did tell him to wear the wrap for 23 hours and take him  off for 1 I am okay with that plan but again right now we got a get things back under control again he may have some cellulitis noted as well. 02/02/2020 upon evaluation today patient unfortunately appears to have several areas of blistering on his bilateral lower extremities today mainly on the feet. His legs do seem to be doing somewhat better which is good news. Fortunately there is no evidence of active infection at this time. No fevers, chills, nausea, vomiting, or diarrhea. 02/16/2020 upon evaluation today patient appears to be doing well at this time with regard to his legs. He has a couple weeping areas on his toes but for the most part everything is doing better and does appear to be sealed up on his legs which is excellent news. We can continue with wrapping him at this point as he had every time we discontinue the wraps he just breaks out with new wounds. There is really no point in is going forward with this at this point. 03/08/2020 upon evaluation today patient actually appears to be doing quite well with regard to his lower extremity ulcers. He has just a very superficial and really almost nonexistent blister on the left lower extremity he has in general done very well with the compression wraps. With that being said I do not see any signs of infection at this time which is good news. 03/29/2020  upon evaluation today patient appears to be doing well with regard to his wounds currently except for where he had several new areas that opened up due to some of the wrap slipping and causing him trouble. He states he did not realize they had slipped. Nonetheless he has a 1 area on the right and 3 new areas on the left. Fortunately there is no signs of active infection at this time which is great news. 04/05/2020 upon evaluation today patient actually appears to be doing quite well in general in regard to his legs currently. Fortunately there is no signs of active infection at this time. No fevers, chills, nausea, vomiting, or diarrhea. He tells me next week that he will actually be seen in the lymphedema clinic on Thursday at 10 AM I see him on Wednesday next week. 04/12/2020 upon evaluation today patient appears to be doing very well with regard to his lower extremities bilaterally. In fact he does not appear to have any open wounds at this point which is good news. Fortunately there is no signs of active infection at this time. No fevers, chills, nausea, vomiting, or diarrhea. 04/19/2020 upon evaluation today patient appears to be doing well with regard to his wounds currently on the bilateral lower extremities. There does not appear to be any signs of active infection at this time. Fortunately there is no evidence of systemic infection and overall very pleased at this point. Nonetheless after I held him out last week he literally had blisters the next morning already which swelled up with him being right back here in the clinic. Overall I think that he is just not can be able to be discharged with his legs the way they are he is much to volume overloaded as far as fluid is concerned and that was discussed with him today of also discussed this but should try the clinic nurse manager as well as Dr. Leanord Hawking. 04/26/2020 upon evaluation today patient appears to be doing better with regard to his wounds  currently. He is making some progress and overall swelling is under good control with  the compression wraps. Fortunately there is no evidence of active infection at this time. 05/10/2020 on evaluation today patient appears to be doing overall well in regard to his lower extremities bilaterally. He is Tolerating the compression wraps without complication and with what we are seeing currently I feel like that he is making excellent progress. There is no signs of active infection at this time. 05/24/2020 upon evaluation today patient appears to be doing well in regard to his legs. The swelling is actually quite a bit down compared to where it has been in the past. Fortunately there is no sign of active infection at this time which is also good news. With that being said he does have several wounds on his toes that have opened up at this point. 05/31/2020 upon evaluation today patient appears to be doing well with regard to his legs bilaterally where he really has no significant fluid buildup at this point overall he seems to be doing quite well. Very pleased in this regard. With regard to his toes these also seem to be drying up which is excellent. We have continue to wrap him as every time we tried as a transition to the juxta light wraps things just do not seem to get any better. 06/07/2020 upon evaluation today patient appears to be doing well with regard to his right leg at this point. Unfortunately left leg has a lot of blistering he tells me the wrap started to slide down on him when he tried to put his other Velcro wrap over top of it to help keep things in order but nonetheless still had some issues. 06/14/2020 on evaluation today patient appears to be doing well with regard to his lower extremity ulcers and foot ulcers at this point. I feel like everything is actually showing signs of improvement which is great news overall there is no signs of active infection at this time. No fevers, chills, nausea,  vomiting, or diarrhea. 06/21/2020 on evaluation today patient actually appears to be doing okay in regard to his wounds in general. With that being said the biggest issue I see is on his right foot in particular the first and second toe seem to be doing a little worse due to the fact this is staying very wet. I think he is probably getting need to change out his dressings a couple times in between each week when we see him in regard to his toes in order to keep this drier based on the location and how this is proceeding. 06/28/2020 on evaluation today patient appears to be doing a little bit more poorly overall in regard to the appearance of the skin I am actually somewhat concerned about the possibility of him having a little bit of an infection here. We discussed the course of potentially giving him a doxycycline prescription which he is taken previously with good result. With that being said I do believe that this is potentially mild and at this point easily fixed. I just do not want anything to get any worse. 07/12/2020 upon evaluation today patient actually appears to be making some progress with regard to his legs which is great news there does not appear to be any evidence of active infection. Overall very pleased with where things stand. 07/26/2020 upon evaluation today patient appears to be doing well with regard to his leg ulcers and toe ulcers at this point. He has been tolerating the compression wraps without complication overall very pleased in this regard. 08/09/2020 upon evaluation  today patient appears to be doing well with regard to his lower extremities bilaterally. Fortunately there is no signs of active infection overall I am pleased with where things stand. 08/23/2020 on evaluation today patient appears to be doing well with regard to his wound. He has been tolerating the dressing changes without complication. Fortunately there is no signs of active infection at this time. Overall his  legs seem to be doing quite well which is great news and I am very pleased in that regard. No fevers, chills, nausea, vomiting, or diarrhea. 09/13/2020 upon evaluation today patient appears to be doing okay in regard to his lower extremities. He does have a fairly large blister on the right leg which I did remove the blister tissue from today so we can get this to dry out other than that however he seems to be doing quite well. There is no signs of active infection at this time. 09/27/2020 upon evaluation today patient appears to actually be doing some better in regard to his right leg. Fortunately signs of active infection at this time which is great news. No fevers, chills, nausea, vomiting, or diarrhea. Objective Constitutional Obese and well-hydrated in no acute distress. Vitals Time Taken: 10:48 AM, Height: 70 in, Weight: 380.2 lbs, BMI: 54.5, Temperature: 97.8 F, Pulse: 78 bpm, Respiratory Rate: 18 breaths/min, Blood Pressure: 155/66 mmHg. Respiratory normal breathing without difficulty. Psychiatric this patient is able to make decisions and demonstrates good insight into disease process. Alert and Oriented x 3. pleasant and cooperative. General Notes: Upon inspection patient's wound bed actually showed signs of good granulation at this time and a lot of areas with good epithelial growth. He does still have some erythema noted and I do believe he needs to be on the antibiotic as soon as possible. Again have already sent this in for him that is Bactrim he should pick that up today. Integumentary (Hair, Skin) Wound #185 status is Open. Original cause of wound was Blister. The wound is located on the Right,Lateral Lower Leg. The wound measures 14cm length x 19cm width x 0.1cm depth; 208.916cm^2 area and 20.892cm^3 volume. There is Fat Layer (Subcutaneous Tissue) exposed. There is no tunneling or undermining noted. There is a large amount of purulent drainage noted. Foul odor after  cleansing was noted. The wound margin is distinct with the outline attached to the wound base. There is small (1-33%) red granulation within the wound bed. There is a large (67-100%) amount of necrotic tissue within the wound bed including Adherent Slough. Wound #186 status is Healed - Epithelialized. Original cause of wound was Blister. The wound is located on the Right,Posterior Lower Leg. The wound measures 0cm length x 0cm width x 0cm depth; 0cm^2 area and 0cm^3 volume. Wound #187 status is Open. Original cause of wound was Gradually Appeared. The wound is located on the Left,Medial Lower Leg. The wound measures 1cm length x 1.5cm width x 0.1cm depth; 1.178cm^2 area and 0.118cm^3 volume. There is Fat Layer (Subcutaneous Tissue) exposed. There is no tunneling or undermining noted. There is a medium amount of serosanguineous drainage noted. The wound margin is distinct with the outline attached to the wound base. There is large (67-100%) pink, pale granulation within the wound bed. There is no necrotic tissue within the wound bed. Wound #188 status is Open. Original cause of wound was Gradually Appeared. The wound is located on the Left,Anterior Lower Leg. The wound measures 0.8cm length x 2cm width x 0.1cm depth; 1.257cm^2 area and 0.126cm^3 volume.  There is Fat Layer (Subcutaneous Tissue) exposed. There is no tunneling or undermining noted. There is a medium amount of serosanguineous drainage noted. The wound margin is distinct with the outline attached to the wound base. There is large (67-100%) pink, pale granulation within the wound bed. There is no necrotic tissue within the wound bed. 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 There was a Radio broadcast assistant Compression Therapy Procedure by Yevonne Pax, RN. Post procedure Diagnosis Wound #185: Same as Pre-Procedure Wound #187 Pre-procedure diagnosis of Wound #187 is a Lymphedema located on the Left,Medial Lower Leg . There was a Four Layer Compression Therapy Procedure by Yevonne Pax, RN. Post procedure Diagnosis Wound #187: Same as Pre-Procedure Plan Follow-up Appointments: Return Appointment in 1 week. Dressing Change Frequency: Do not change entire dressing for one week. Skin Barriers/Peri-Wound Care: Moisturizing lotion - both legs and feet and toes Other: - patient to get urea cream 40% to be used on feet and toes Wound Cleansing: May shower with protection. Primary Wound Dressing: Wound #185 Right,Lateral Lower Leg: Calcium Alginate with Silver Wound #187 Left,Medial Lower Leg: Calcium Alginate with Silver Wound #188 Left,Anterior Lower Leg: Calcium Alginate with Silver Secondary Dressing: Wound #185 Right,Lateral Lower Leg: Dry Gauze ABD pad - as needed Wound #187 Left,Medial Lower Leg: Dry Gauze ABD pad - as needed Wound #188 Left,Anterior Lower Leg: Dry Gauze ABD pad - as needed Edema Control: 4 layer compression: Left lower extremity Unna Boot to Right Lower Extremity Avoid standing for long periods of time Elevate legs to the level of the heart or above for 30 minutes daily and/or when sitting, a frequency of: - throughout the day Exercise regularly 1. I would recommend at this time that we have the patient continue to monitor for any signs of worsening infection such as increased pain. Right now I do not see anything of that sort. 2. I am also can recommend at this  time that the patient continue with a 4-layer compression wrap on the left leg and Unna boot to the right lower extremity he seems to do well with this combination. 3. I would recommend he continue to elevate his legs much as possible. We will see patient back for reevaluation in 1 week here in the clinic. If anything worsens or changes patient will contact our office for additional recommendations. Electronic Signature(s) Signed: 09/27/2020 4:36:08 PM By: Lenda Kelp PA-C Entered By: Lenda Kelp on 09/27/2020 16:36:08 -------------------------------------------------------------------------------- SuperBill Details Patient Name: Date of Service: [name], Guss J. 09/27/2020 Medical Record Number: [id number] Patient Account Number: [id number] Date of Birth/Sex: Treating RN: January 01, 1951 (69 y.o. [name] Primary Care Provider: Nicoletta Ba Other Clinician: Referring Provider: Treating Provider/Extender: Adele Dan in Treatment: 244 Diagnosis Coding ICD-10 Codes Code Description [phone] Non-pressure chronic ulcer of right calf limited to breakdown of skin L97.221 Non-pressure chronic ulcer of left calf limited to breakdown of skin 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 of bilateral lower extremity I89.0 Lymphedema, not elsewhere classified E11.622 Type 2  diabetes mellitus with other skin ulcer 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, unspecified L03.116 Cellulitis of left lower limb Facility Procedures CPT4 Code: 16109604 Description: (Facility Use Only) [phone] - APPLY UNNA BOOT RT Modifier: Quantity: 1 CPT4 Code: [id number] Description: (Facility Use Only) 62130QM - APPLY MULTLAY COMPRS LWR LT LEG Modifier: 59 Quantity: 1 Physician Procedures : CPT4 Code Description Modifier [id number] - WC PHYS LEVEL 4 - EST PT ICD-10 Diagnosis Description L97.211 Non-pressure chronic ulcer of  right calf limited to breakdown of skin I87.333 Chronic venous hypertension (idiopathic) with ulcer and  inflammation of bilateral lower extremity L97.221 Non-pressure chronic ulcer of left calf limited to breakdown of skin I89.0 Lymphedema, not elsewhere classified Quantity: 1 Electronic Signature(s) Signed: 09/27/2020 4:36:28 PM By: Lenda Kelp PA-C Entered By: Lenda Kelp on 09/27/2020 16:36:26
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
## 2020-10-02 ENCOUNTER — Other Ambulatory Visit: Payer: Self-pay | Admitting: Family Medicine

## 2020-10-02 ENCOUNTER — Encounter: Payer: Self-pay | Admitting: Family Medicine

## 2020-10-02 MED ORDER — HYDROCODONE-ACETAMINOPHEN 5-325 MG PO TABS
ORAL_TABLET | ORAL | 0 refills | Status: DC
Start: 2020-10-02 — End: 2020-11-16

## 2020-10-02 NOTE — Telephone Encounter (Signed)
Requesting: hydrocodone Contract:04/05/19 UDS: n/a Last Visit:07/12/20 Next Visit:10/11/20 Last Refill: 08/16/20 (60,0)  Please Advise, medication pending

## 2020-10-02 NOTE — Telephone Encounter (Signed)
Patient called to request refill of hydrocodone. Please send to same [facility] in [place].

## 2020-10-04 ENCOUNTER — Other Ambulatory Visit: Payer: Self-pay

## 2020-10-04 ENCOUNTER — Encounter (HOSPITAL_BASED_OUTPATIENT_CLINIC_OR_DEPARTMENT_OTHER): Payer: Medicare Other | Attending: Physician Assistant | Admitting: Physician Assistant

## 2020-10-04 DIAGNOSIS — I87333 Chronic venous hypertension (idiopathic) with ulcer and inflammation of bilateral lower extremity: Secondary | ICD-10-CM | POA: Diagnosis not present

## 2020-10-04 DIAGNOSIS — E1140 Type 2 diabetes mellitus with diabetic neuropathy, unspecified: Secondary | ICD-10-CM | POA: Diagnosis not present

## 2020-10-04 DIAGNOSIS — L97821 Non-pressure chronic ulcer of other part of left lower leg limited to breakdown of skin: Secondary | ICD-10-CM | POA: Diagnosis not present

## 2020-10-04 DIAGNOSIS — I890 Lymphedema, not elsewhere classified: Secondary | ICD-10-CM | POA: Diagnosis not present

## 2020-10-04 DIAGNOSIS — E11622 Type 2 diabetes mellitus with other skin ulcer: Secondary | ICD-10-CM | POA: Diagnosis not present

## 2020-10-04 DIAGNOSIS — L97221 Non-pressure chronic ulcer of left calf limited to breakdown of skin: Secondary | ICD-10-CM | POA: Diagnosis not present

## 2020-10-04 DIAGNOSIS — L03116 Cellulitis of left lower limb: Secondary | ICD-10-CM | POA: Insufficient documentation

## 2020-10-04 DIAGNOSIS — L97211 Non-pressure chronic ulcer of right calf limited to breakdown of skin: Secondary | ICD-10-CM | POA: Insufficient documentation

## 2020-10-04 DIAGNOSIS — I872 Venous insufficiency (chronic) (peripheral): Secondary | ICD-10-CM | POA: Diagnosis not present

## 2020-10-04 DIAGNOSIS — L97812 Non-pressure chronic ulcer of other part of right lower leg with fat layer exposed: Secondary | ICD-10-CM | POA: Diagnosis not present

## 2020-10-04 NOTE — Progress Notes (Addendum)
[name], [name] ([id number]) Visit Report for 10/04/2020 Chief Complaint Document Details Patient Name: Date of Service: [name], Kevin 10/04/2020 10:30 A M Medical Record Number: [id number] Patient Account Number: [id number] Date of Birth/Sex: Treating [name]: 03/21/1951 (69 y.o. [name] Primary Care Provider: Nicoletta Ba Other Clinician: Referring Provider: Treating Provider/Extender: Adele Dan in Treatment: [phone] Information Obtained from: Patient Chief Complaint patient is here for evaluation venous/lymphedema weeping Electronic Signature(s) Signed: 10/04/2020 10:26:45 AM By: Lenda Kelp PA-C Entered By: Lenda Kelp on 10/04/2020 10:26:44 -------------------------------------------------------------------------------- HPI Details Patient Name: Date of Service: [name], [name] 10/04/2020 10:30 A M Medical Record Number: [id number] Patient Account Number: [id number] Date of Birth/Sex: Treating [name]: Apr 01, 1951 (69 y.o. [name] Primary Care Provider: Nicoletta Ba Other Clinician: Referring Provider: Treating Provider/Extender: Adele Dan in Treatment: 245 History of Present Illness HPI Description: Referred by PCP for consultation. Patient has long standing history of BLE venous stasis, no prior ulcerations. At beginning of month, developed cellulitis and weeping. Received IM Rocephin followed by Keflex and resolved. Wears compression stocking, appr 6 months old. Not sure strength. No present drainage. 01/22/16 this is a patient who is a type II diabetic on insulin. 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 He tells me he religiously wears pressure stockings of uncertain strength. He was here with weeping edema about 8 months ago but did not have an open wound. Roughly a month ago he had a reopening on his bilateral legs. He is been using bandages and Neosporin. He  does not complain of pain. 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 Last BUN/creatinine I have is from 11/20/15 at 13 and 1.0 respectively 01/29/16; patient arrives today having tolerated the Profore wrap. He brought in his stockings and these are 18 mmHg stockings he bought from [place]. The compression here is likely inadequate. He does not complain of pain or excessive drainage she has no systemic symptoms. The wound on the right looks improved as does the one on the left although one on the left is more substantial with still tissue at risk below the actual wound area on the bilateral posterior calf 02/05/16; patient arrives with poor edema control. He states that we did put a 4 layer compression on it last week. No weight appear 5 this. 02/12/16; the area on the posterior right Has healed. The left Has a substantial wound that has necrotic surface eschar that requires a debridement with a curette. 02/16/16;the patient called or a Nurse visit secondary to increased swelling. He had been in earlier in the week with his right leg healed. He was transitioned to is on pressure stocking on the right leg with the only open wound on the left, a substantial area on the left posterior calf. Note he has a history of severe lower extremity edema, he has a history of chronic atrial fibrillation but not heart failure per my notes but I'll need to research this. He is not complaining of chest pain shortness of breath or orthopnea. The intake nurse noted blisters on the previously closed right leg 02/19/16; this is the patient's regular visit day. I see him on Friday with escalating edema new wounds on the right leg and clear signs of at least right ventricular heart failure. I increased his Lasix to 40 twice a day.  He is returning currently in follow-up. States he is noticed a decrease in that the  edema 02/26/16 patient's legs have much less edema. There is nothing really open on the right leg. The left leg has improved condition of the large superficial wound on the posterior left leg 03/04/16; edema control is very much better. The patient's right leg wounds have healed. On the left leg he continues to have severe venous inflammation on the posterior aspect of the left leg. There is no tenderness and I don't think any of this is cellulitis. 03/11/16; patient's right leg is married healed and he is in his own stocking. The patient's left leg has deteriorated somewhat. There is a lot of erythema around the wound on the posterior left leg. There is also a significant rim of erythema posteriorly just above where the wrap would've ended there is a new wound in this location and a lot of tenderness. Can't rule out cellulitis in this area. 03/15/16; patient's right leg remains healed and he is in his own stocking. The patient's left leg is much better than last review. His major wound on the posterior aspect of his left Is almost fully epithelialized. He has 3 small injuries from the wraps. Really. Erythema seems a lot better on antibiotics 03/18/16; right leg remains healed and he is in his own stocking. The patient's left leg is much better. The area on the posterior aspect of the left calf is fully epithelialized. His 3 small injuries which were wrap injuries on the left are improved only one seems still open his erythema has resolved 03/25/16; patient's right leg remains healed and he is in his own stocking. There is no open area today on the left leg posterior leg is completely closed up. His wrap injuries at the superior aspect of his leg are also resolved. He looks as though he has some irritation on the dorsal ankle but this is fully epithelialized without evidence of infection. 03/28/16; we discharged this patient on Monday. Transitioned him into his own stocking. There were problems almost  immediately with uncontrolled swelling weeping edema multiple some of which have opened. He does not feel systemically unwell in particular no chest pain no shortness of breath and he does not feel 04/08/16; the edema is under better control with the Profore light wrap but he still has pitting edema. There is one large wound anteriorly 2 on the medial aspect of his left leg and 3 small areas on the superior posterior calf. Drainage is not excessive he is tolerating a Profore light well 04/15/16; put a Profore wrap on him last week. This is controlled is edema however he had a lot of pain on his left anterior foot most of his wounds are healed 04/22/16 once again the patient has denuded areas on the left anterior foot which he states are because his wrap slips up word. He saw his primary physician today is on Lasix 40 twice a day and states that he his weight is down 20 pounds over the last 3 months. 04/29/16: Much improved. left anterior foot much improved. He is now on Lasix 80 mg per day. Much improved edema control 05/06/16; I was hoping to be able to discharge him today however once again he has blisters at a low level of where the compression was placed last week mostly on his left lateral but also his left medial leg and a small area on the anterior part of the left foot. 05/09/16; apparently the patient went  home after his appointment on 7/4 later in the evening developing pain in his upper medial thigh together with subjective fever and chills although his temperature was not taken. The pain was so intense he felt he would probably have to call 911. However he then remembered that he had leftover doxycycline from a previous round of antibiotics and took these. By the next morning he felt a lot better. He called and spoke to one of our nurses and I approved doxycycline over the phone thinking that this was in relation to the wounds we had previously seen although they were definitely were not. The  patient feels a lot better old fever no chills he is still working. Blood sugars are reasonably controlled 05/13/16; patient is back in for review of his cellulitis on his anterior medial upper thigh. He is taking doxycycline this is a lot better. Culture I did of the nodular area on the dorsal aspect of his foot grew MRSA this also looks a lot better. 05/20/16; the patient is cellulitis on the medial upper thigh has resolved. All of his wound areas including the left anterior foot, areas on the medial aspect of the left calf and the lateral aspect of the calf at all resolved. He has a new blister on the left dorsal foot at the level of the fourth toe this was excised. No evidence of infection 05/27/16; patient continues to complain weeping edema. He has new blisterlike wounds on the left anterior lateral and posterior lateral calf at the top of his wrap levels. The area on his left anterior foot appears better. He is not complaining of fever, pain or pruritus in his feet. 05/30/16; the patient's blisters on his left anterior leg posterior calf all look improved. He did not increase the Lasix 100 mg as I suggested because he was going to run out of his 40 mg tablets. He is still having weeping edema of his toes 06/03/16; I renewed his Lasix at 80 mg once a day as he was about to run out when I last saw him. He is on 80 mg of Lasix now. I have asked him to cut down on the excessive amount of water he was drinking and asked him to drink according to his thirst mechanisms 06/12/2016 -- was seen 2 days ago and was supposed to wear his compression stockings at home but he is developed lymphedema and superficial blisters on the left lower extremity and hence came in for a review 06/24/16; the remaining wound is on his left anterior leg. He still has edema coming from between his toes. There is lymphedema here however his edema is generally better than when I last saw this. He has a history of atrial fibrillation  but does not have a known history of congestive heart failure nevertheless I think he probably has this at least on a diastolic basis. 07/01/16 I reviewed his echocardiogram from January 2017. This was essentially normal. He did not have LVH, EF of 55-60%. His right ventricular function was normal although he did have trivial tricuspid and pulmonic regurgitation. This is not audible on exam however. I increased his Lasix to do massive edema in his legs well above his knees I think in early July. He was also drinking an excessive amount of water at the time. 07/15/16; missed his appointment last week because of the Labor Day holiday on Monday. He could not get another appointment later in the week. Started to feel the wrap digging in superiorly so  we remove the top half and the bottom half of his wrap. He has extensive erythema and blistering superiorly in the left leg. Very tender. Very swollen. Edema in his foot with leaking edema fluid. He has not been systemically unwell 07/22/16; the area on the left leg laterally required some debridement. The medial wounds look more stable. His wrap injury wounds appear to have healed. Edema and his foot is better, weeping edema is also better. He tells me he is meeting with the supplier of the external compression pumps at work 08/05/16; the patient was on vacation last week in [facility]. His wrap is been on for an extended period of time. Also over the weekend he developed an extensive area of tender erythema across his anterior medial thigh. He took to doxycycline yesterday that he had leftover from a previous prescription. The patient complains of weeping edema coming out of his toes 08/08/16; I saw this patient on 10/2. He was tender across his anterior thigh. I put him on doxycycline. He returns today in follow-up. He does not have any open wounds on his lower leg, he still has edema weeping into his toes. 08/12/16; patient was seen back urgently today to  follow-up for his extensive left thigh cellulitis/erysipelas. He comes back with a lot less swelling and erythema pain is much better. I believe I gave him Augmentin and Cipro. His wrap was cut down as he stated a roll down his legs. He developed blistering above the level of the wrap that remained. He has 2 open blisters and 1 intact. 08/19/16; patient is been doing his primary doctor who is increased his Lasix from 40-80 once a day or 80 already has less edema. Cellulitis has remained improved in the left thigh. 2 open areas on the posterior left calf 08/26/16; he returns today having new open blisters on the anterior part of his left leg. He has his compression pumps but is not yet been shown how to use some vital representative from the supplier. 09/02/16 patient returns today with no open wounds on the left leg. Some maceration in his plantar toes 09/10/2016 -- Dr. Leanord Hawking had recently discharged him on 09/02/2016 and he has come right back with redness swelling and some open ulcers on his left lower extremity. He says this was caused by trying to apply his compression stockings and he's been unable to use this and has not been able to use his lymphedema pumps. He had some doxycycline leftover and he has started on this a few days ago. 09/16/16; there are no open wounds on his leg on the left and no evidence of cellulitis. 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 He does not complain of symptoms here. I am not clear use using his external compression pumps. 09/23/16; I have not seen this patient in 2 weeks. He canceled his appointment 10 days ago as he was going on vacation. He tells me that on Monday he noticed a large area on his posterior left leg which is been draining copiously and is reopened into a large wound. He is been using ABDs and the external part of his juxtalite, according to our nurse this was not on properly. 10/07/16;  Still a substantial area on the posterior left leg. Using silver alginate 10/14/16; in general better although there is still open area which looks healthy. Still using silver alginate. He reminds me that this happen before he left for [facility].  T oday while he was showering in the morning. He had been using his juxtalite's 10/21/16; the area on his posterior left leg is fully epithelialized. However he arrives today with a large area of tender erythema in his medial and posterior left thigh just above the knee. I have marked the area. Once again he is reluctant to consider hospitalization. I treated him with oral antibiotics in the past for a similar situation with resolution I think with doxycycline however this area it seems more extensive to me. He is not complaining of fever but does have chills and says states he is thirsty. His blood sugar today was in the 140s at home 10/25/16 the area on his posterior left leg is fully epithelialized although there is still some weeping edema. The large area of tenderness and erythema in his medial and posterior left thigh is a lot less tender although there is still a lot of swelling in this thigh. He states he feels a lot better. He is on doxycycline and Augmentin that I started last week. This will continued until Tuesday, December 26. I have ordered a duplex ultrasound of the left thigh rule out DVT whether there is an abscess something that would need to be drained I would also like to know. 11/01/16; he still has weeping edema from a not fully epithelialized area on his left posterior calf. Most of the rest of this looks a lot better. He has completed his antibiotics. His thigh is a lot better. Duplex ultrasound did not show a DVT in the thigh 11/08/16; he comes in today with more Denuded surface epithelium from the posterior aspect of his calf. There is no real evidence of cellulitis. The superior aspect of his wrap appears to have put quite an  indentation in his leg just below the knee and this may have contributed. He does not complain of pain or fever. We have been using silver alginate as the primary dressing. The area of cellulitis in the right thigh has totally resolved. He has been using his compression stockings once a week 11/15/16; the patient arrives today with more loss of epithelium from the posterior aspect of his left calf. He now has a fairly substantial wound in this area. The reason behind this deterioration isn't exactly clear although his edema is not well controlled. He states he feels he is generally more swollen systemically. He is not complaining of chest pain shortness of breath fever. T me he has an appointment with his primary physician in early February. He is on 80 mg of oral ells Lasix a day. He claims compliance with the external compression pumps. He is not having any pain in his legs similar to what he has with his recurrent cellulitis 11/22/16; the patient arrives a follow-up of his large area on his left lateral calf. This looks somewhat better today. He came in earlier in the week for a dressing change since I saw him a week ago. He is not complaining of any pain no shortness of breath no chest pain 11/28/16; the patient arrives for follow-up of his large area on the left lateral calf this does not look better. In fact it is larger weeping edema. The surface of the wound does not look too bad. We have been using silver alginate although I'm not certain that this is a dressing issue. 12/05/16; again the patient follows up for a large wound on the left lateral and left posterior calf this does not look better. There continues  to be weeping edema necrotic surface tissue. More worrisome than this once again there is erythema below the wound involving the distal Achilles and heel suggestive of cellulitis. He is on his feet working most of the day of this is not going well. We are changing his dressing twice a week to  facilitate the drainage. 12/12/16; not much change in the overall dimensions of the large area on the left posterior calf. This is very inflamed looking. I gave him an. Doxycycline last week does not really seem to have helped. He found the wrap very painful indeed it seems to of dog into his legs superiorly and perhaps around the heel. He came in early today because the drainage had soaked through his dressings. 12/19/16- patient arrives for follow-up evaluation of his left lower extremity ulcers. He states that he is using his lymphedema pumps once daily when there is "no drainage". He admits to not using his lipedema pumps while under current treatment. His blood sugars have been consistently between 150-200. 12/26/16; the patient is not using his compression pumps at home because of the wetness on his feet. I've advised him that I think it's important for him to use this daily. He finds his feet too wet, he can put a plastic bag over his legs while he is in the pumps. Otherwise I think will be in a vicious circle. We are using silver alginate to the major area on his left posterior calf 01/02/17; the patient's posterior left leg has further of all into 3 open wounds. All of them covered with a necrotic surface. He claims to be using his compression pumps once a day. His edema control is marginal. Continue with silver alginate 01/10/17; the patient's left posterior leg actually looks somewhat better. There is less edema, less erythema. Still has 3 open areas covered with a necrotic surface requiring debridement. He claims to be using his compression pumps once a day his edema control is better 01/17/17; the patient's left posterior calf look better last week when I saw him and his wrap was changed 2 days ago. He has noted increasing pain in the left heel and arrives today with much larger wounds extensive erythema extending down into the entire heel area especially tender medially. He is not  systemically unwell CBGs have been controlled no fever. Our intake nurse showed me limegreen drainage on his AVD pads. 01/24/17; his usual this patient responds nicely to antibiotics last week giving him Levaquin for presumed Pseudomonas. The whole entire posterior part of his leg is much better much less inflamed and in the case of his Achilles heel area much less tender. He has also had some epithelialization posteriorly there are still open areas here and still draining but overall considerably better 01/31/17- He has continue to tolerate the compression wraps. he states that he continues to use the lymphedema pumps daily, and can increase to twice daily on the weekends. He is voicing no complaints or concerns regarding his LLE ulcers 02/07/17-he is here for follow-up evaluation. He states that he noted some erythema to the left medial and anterior thigh, which he states is new as of yesterday. He is concerned about recurrent cellulitis. He states his blood sugars have been slightly elevated, this morning in the 180s 02/14/17; he is here for follow-up evaluation. When he was last here there was erythema superiorly from his posterior wound in his anterior thigh. He was prescribed Levaquin however a culture of the wound surface grew MRSA over the  phone I changed him to doxycycline on Monday and things seem to be a lot better. 02/24/17; patient missed his appointment on Friday therefore we changed his nurse visit into a physician visit today. Still using silver alginate on the large area of the posterior left thigh. He isn't new area on the dorsal left second toe 03/03/17; actually better today although he admits he has not used his external compression pumps in the last 2 days or so because of work responsibilities over the weekend. 03/10/17; continued improvement. External compression pumps once a day almost all of his wounds have closed on the posterior left calf. Better edema control 03/17/17; in general  improved. He still has 3 small open areas on the lateral aspect of his left leg however most of the area on the posterior part of his leg is epithelialized. He has better edema control. He has an ABD pad under his stocking on the right anterior lower leg although he did not let us look at that today. 03/24/17; patient arrives back in clinic today with no open areas however there are areas on the posterior left calf and anterior left calf that are less than 100% epithelialized. His edema is well controlled in the left lower leg. There is some pitting edema probably lymphedema in the left upper thigh. He uses compression pumps at home once per day. I tried to get him to do this twice a day although he is very reticent. 04/01/2017 -- for the last 2 days he's had significant redness, tenderness and weeping and came in for an urgent visit today. 04/07/17; patient still has 6 more days of doxycycline. He was seen by Dr. Meyer Russel last Wednesday for cellulitis involving the posterior aspect, lateral aspect of his Involving his heel. For the most part he is better there is less erythema and less weeping. He has been on his feet for 12 hours 2 over the weekend. Using his compression pumps once a day 04/14/17 arrives today with continued improvement. Only one area on the posterior left calf that is not fully epithelialized. He has intense bilateral venous inflammation associated with his chronic venous insufficiency disease and secondary lymphedema. We have been using silver alginate to the left posterior calf wound In passing he tells [place] today that the right leg but we have not seen in quite some time has an open area on it but he doesn't want [place] to look at this today states he will show this to [place] next week. 04/21/17; there is no open area on his left leg although he still reports some weeping edema. He showed [place] his right leg today which is the first time we've seen this leg in a long time. He has a large area of  open wound on the right leg anteriorly healthy granulation. Quite a bit of swelling in the right leg and some degree of venous inflammation. He told us about the right leg in passing last week but states that deterioration in the right leg really only happened over the weekend 04/28/17; there is no open area on the left leg although there is an irritated part on the posterior which is like a wrap injury. The wound on the right leg which was new from last week at least to [place] is a lot better. 05/05/17; still no open area on the left leg. Patient is using his new compression stocking which seems to be doing a good job of controlling the edema. He states he is using his compression  pumps once per day. The right leg still has an open wound although it is better in terms of surface area. Required debridement. A lot of pain in the posterior right Achilles marked tenderness. Usually this type of presentation this patient gives concern for an active cellulitis 05/12/17; patient arrives today with his major wound from last week on the right lateral leg somewhat better. Still requiring debridement. He was using his compression stocking on the left leg however that is reopened with superficial wounds anteriorly he did not have an open wound on this leg previously. He is still using his juxta light's once daily at night. He cannot find the time to do this in the morning as he has to be at work by 7 AM 05/19/17; right lateral leg wound looks improved. No debridement required. The concerning area is on the left posterior leg which appears to almost have a subcutaneous hemorrhagic component to it. We've been using silver alginate to all the wounds 05/26/17; the right lateral leg wound continues to look improved. However the area on the left posterior calf is a tightly adherent surface. [place] using silver alginate. Because of the weeping edema in his legs there is very little good alternatives. 06/02/17; the patient left  here last week looking quite good. Major wound on the left posterior calf and a small one on the right lateral calf. Both of these look satisfactory. He tells me that by Wednesday he had noted increased pain in the left leg and drainage. He called on Thursday and Friday to get an appointment here but we were blocked. He did not go to urgent care or his primary physician. He thinks he had a fever on Thursday but did not actually take his temperature. He has not been using his compression pumps on the left leg because of pain. I advised him to go to the emergency room today for IV antibiotics for stents of left leg cellulitis but he has refused I have asked him to take 2 days off work to keep his leg elevated and he has refused this as well. In view of this I'm going to call him and Augmentin and doxycycline. 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 However after a detailed discussion regarding the risk benefits and alternatives he refuses to get admitted to the hospital. With no other recourse I will continue him on oral antibiotics as before and hopefully he'll have his infectious disease consultation this week. 06/16/2017 -- the patient was seen today by the nurse practitioner at infectious disease Ms. Dixon. 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 He has also been advise Lotrimin cream locally. she also had wise IV antibiotics for his condition if it flares up 06/23/17; patient arrives today with drainage bilaterally although the remaining wound on the left posterior calf after  cleaning up today "highlighter yellow drainage" did not look too bad. Unfortunately he has had breakdown on the right anterior leg [previously this leg had not been open and he is using a black stocking] he went to see infectious disease  and is been put on clindamycin 150 mg daily, I did not verify the dose although I'm not familiar with using clindamycin in this dosing range, perhaps for prophylaxisoo 06/27/17; I brought this patient back today to follow-up on the wound deterioration on the right lower leg together with surrounding cellulitis. I started him on doxycycline 4 days ago. This area looks better however he comes in today with intense cellulitis on the medial part of his left thigh. This is not have a wound in this area. Extremely tender. We've been using silver alginate to the wounds on the right lower leg left lower leg with bilateral 4 layer compression he is using his external compression pumps once a day 07/04/17; patient's left medial thigh cellulitis looks better. He has not been using his compression pumps as his insert said it was contraindicated with cellulitis. His right leg continues to make improvements all the wounds are still open. We only have one remaining wound on the left posterior calf. Using silver alginate to all open areas. He is on doxycycline which I started a week ago and should be finishing I gave him Augmentin after Thursday's visit for the severe cellulitis on the left medial thigh which fortunately looks better 07/14/17; the patient's left medial thigh cellulitis has resolved. The cellulitis in his right lower calf on the right also looks better. All of his wounds are stable to improved we've been using silver alginate he has completed the antibiotics I have given him. He has clindamycin 150 mg once a day prescribed by infectious disease for prophylaxis, I've advised him to start this now. We have been using bilateral Unna boots over silver alginate to the wound  areas 07/21/17; the patient is been to see infectious disease who noted his recurrent problems with cellulitis. He was not able to tolerate prophylactic clindamycin therefore he is on amoxicillin 500 twice a day. He also had a second daily dose of Lasix added By Dr. Oneta Rack but he is not taking this. Nor is he being completely compliant with his compression pumps a especially not this week. He has 2 remaining wounds one on the right posterior lateral lower leg and one on the left posterior medial lower leg. 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 The patient has Unna boots bilaterally. 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 He has bilateral Unna boots for compression and silver alginate to his wounds. Arrives today with his legs looking as good as I have seen him in quite some time. Not surprisingly his wounds look better as well with improvement on the right lateral leg venous insufficiency wound and also the left medial leg. He is still using the compression pumps once a day 08/11/17; both legs appear to be doing better wounds on the right lateral and left medial legs look better. Skin on the right leg quite good. He is been using silver alginate as the primary dressing. I'm going to use Anasept gel calcium alginate and maintain all the secondary dressings 08/18/17; the patient continues to actually do quite well. The area on his right lateral leg is just about closed the left medial also looks better although it is still moist in this area. His edema is well controlled we have been using Anasept gel with calcium alginate and  the usual secondary dressings, 4 layer compression and once daily use of his compression pumps "always been able to manage 09/01/17; the patient  continues to do reasonably well in spite of his trip to [place]. The area on the right lateral leg is epithelialized. Left is much better but still open. He has more edema and more chronic erythema on the left leg [venous inflammation] 09/08/17; he arrives today with no open wound on the right lateral leg and decently controlled edema. Unfortunately his left leg is not nearly as in his good situation as last week.he apparently had increasing edema starting on Saturday. He edema soaked through into his foot so used a plastic bag to walk around his home. The area on the medial right leg which was his open area is about the same however he has lost surface epithelium on the left lateral which is new and he has significant pain in the Achilles area of the left foot. He is already on amoxicillin chronically for prophylaxis of cellulitis in the left leg 09/15/17; he is completed a week of doxycycline and the cellulitis in the left posterior leg and Achilles area is as usual improved. He still has a lot of edema and fluid soaking through his dressings. There is no open wound on the right leg. He saw infectious disease NP today 09/22/17;As usual 1 we transition him from our compression wraps to his stockings things did not go well. He has several small open areas on the right leg. He states this was caused by the compression wrap on his skin although he did not wear this with the stockings over them. He has several superficial areas on the left leg medially laterally posteriorly. He does not have any evidence of active cellulitis especially involving the left Achilles The patient is traveling from [facility] Saturday going to [facility]. He states he isn't attempting to get an appointment with a heel objects wound center there to change his dressings. I am not completely certain whether this will work 10/06/17; the patient came in on Friday for a nurse visit and the nurse reported that his legs actually look  quite good. He arrives in clinic today for his regular follow-up visit. He has a new wound on his left third toe over the PIP probably caused by friction with his footwear. He has small areas on the left leg and a very superficial but epithelialized area on the right anterior lateral lower leg. Other than that his legs look as good as I've seen him in quite some time. We have been using silver alginate Review of systems; no chest pain no shortness of breath other than this a 10 point review of systems negative 10/20/17; seen by Dr. Meyer Russel last week. He had taken some antibiotics [doxycycline] that he had left over. Dr. Meyer Russel thought he had candida infection and declined to give him further antibiotics. He has a small wound remaining on the right lateral leg several areas on the left leg including a larger area on the left posterior several left medial and anterior and a small wound on the left lateral. The area on the left dorsal third toe looks a lot better. ROS; Gen.; no fever, respiratory no cough no sputum Cardiac no chest pain other than this 10 point review of system is negative 10/30/17; patient arrives today having fallen in the bathtub 3 days ago. It took him a while to get up. He has pain and maceration in the wounds on his left  leg which have deteriorated. He has not been using his pumps he also has some maceration on the right lateral leg. 11/03/17; patient continues to have weeping edema especially in the left leg. This saturates his dressings which were just put on on 12/27. As usual the doxycycline seems to take care of the cellulitis on his lower leg. He is not complaining of fever, chills, or other systemic symptoms. He states his leg feels a lot better on the doxycycline I gave him empirically. He also apparently gets injections at his primary doctor's officeo Rocephin for cellulitis prophylaxis. I didn't ask him about his compression pump compliance today I think that's probably  marginal. Arrives in the clinic with all of his dressings primary and secondary macerated full of fluid and he has bilateral edema 11/10/17; the patient's right leg looks some better although there is still a cluster of wounds on the right lateral. The left leg is inflamed with almost circumferential skin loss medially to laterally although we are still maintaining anteriorly. He does not have overt cellulitis there is a lot of drainage. He is not using compression pumps. We have been using silver alginate to the wound areas, there are not a lot of options here 11/17/17; the patient's right leg continues to be stable although there is still open wounds, better than last week. The inflammation in the left leg is better. Still loss of surface layer epithelium especially posteriorly. There is no overt cellulitis in the amount of edema and his left leg is really quite good, tells me he is using his compression pumps once a day. 11/24/17; patient's right leg has a small superficial wound laterally this continues to improve. The inflammation in the left leg is still improving however we have continuous surface layer epithelial loss posteriorly. There is no overt cellulitis in the amount of edema in both legs is really quite good. He states he is using his compression pumps on the left leg once a day for 5 out of 7 days 12/01/17; very small superficial areas on the right lateral leg continue to improve. Edema control in both legs is better today. He has continued loss of surface epithelialization and left posterior calf although I think this is better. We have been using silver alginate with large number of absorptive secondary dressings 4 layer on the left Unna boot on the right at his request. He tells me he is using his compression pumps once a day 12/08/17; he has no open area on the right leg is edema control is good here. On the left leg however he has marked erythema and tenderness breakdown of skin. He has  what appears to be a wrap injury just distal to the popliteal fossa. 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 There was swelling erythema and breakdown in the right leg. He has much worse skin breakdown in the left leg as well multiple open areas medially and posteriorly as well as laterally. He tells me he has been using his compression pumps but tells me he feels that the drainage out of his leg is worse when he uses a compression pumps. T be fair to him he is been saying this o for a while however I don't  know that I have really been listening to this. I wonder if the compression pumps are working properly 12/22/17;. Once again he arrives with severe erythema, weeping edema from the left greater than right leg. Noncompliance with compression pumps. New this visit he is complaining of pain on the lateral aspect of the right leg and the medial aspect of his right thigh. He apparently saw his cardiologist Dr. [name] who was ordered an echocardiogram area and I think this is a step in the right direction 12/25/17; started his doxycycline Monday night. There is still intense erythema of the right leg especially in the anterior thigh although there is less tenderness. The erythema around the wound on the right lateral calf also is less tender. He still complaining of pain in the left heel. His wounds are about the same right lateral left medial left lateral. Superficial but certainly not close to closure. He denies being systemically unwell no fever chills no abdominal pain no diarrhea 12/29/17; back in follow-up of his extensive right calf and right thigh cellulitis. I added amoxicillin to cover possible doxycycline resistant  strep. This seems to of done the trick he is in much less pain there is much less erythema and swelling. He has his echocardiogram at 11:00 this morning. X-ray of the left heel was also negative. 01/05/18; the patient arrived with his edema under much better control. Now that he is retired he is able to use his compression pumps daily and sometimes twice a day per the patient. He has a wound on the right leg the lateral wound looks better. Area on the left leg also looks a lot better. He has no evidence of cellulitis in his bilateral thighs I had a quick peak at his echocardiogram. He is in normal ejection fraction and normal left ventricular function. He has moderate pulmonary hypertension moderately reduced right ventricular function. One would have to wonder about chronic sleep apnea although he says he doesn't snore. He'll review the echocardiogram with his cardiologist. 01/12/18; the patient arrives with the edema in both legs under exemplary control. He is using his compression pumps daily and sometimes twice daily. His wound on the right lateral leg is just about closed. He still has some weeping areas on the posterior left calf and lateral left calf although everything is just about closed here as well. I have spoken with Aldean Baker who is the patient's nurse practitioner and infectious disease. She was concerned that the patient had not understood that the parenteral penicillin injections he was receiving for cellulitis prophylaxis was actually benefiting him. I don't think the patient actually saw that I would tend to agree we were certainly dealing with less infections although he had a serious one last month. 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 He is healed. He'll be placed in juxtalite compression wraps and increase his lymphedema pumps to twice daily. We will follow up again next week to ensure there are no issues with the new  regiment. 01/20/18-he is here for evaluation of bilateral lower extremity weeping edema. Yesterday he was placed in compression wrap to the right lower extremity and compression stocking to left lower shrubbery. He states he uses lymphedema pumps last night and again this morning and noted a blister to the left lower extremity. On exam he was noted to have drainage to the right lower extremity. He will be placed in Unna boots bilaterally and follow-up next week 01/26/18; patient was actually discharged a week  ago to his own juxta light stockings only to return the next day with bilateral lower extremity weeping edema.he was placed in bilateral Unna boots. He arrives today with pain in the back of his left leg. There is no open area on the right leg however there is a linear/wrap injury on the left leg and weeping edema on the left leg posteriorly. I spoke with infectious disease about 10 days ago. They were disappointed that the patient elected to discontinue prophylactic intramuscular penicillin shots as they felt it was particularly beneficial in reducing the frequency of his cellulitis. I discussed this with the patient today. He does not share this view. He'll definitely need antibiotics today. Finally he is traveling to [place] and trauma leaving this Saturday and returning a week later and he does not travel with his pumps. He is going by car 01/30/18; patient was seen 4 days ago and brought back in today for review of cellulitis in the left leg posteriorly. I put him on amoxicillin this really hasn't helped as much as I might like. He is also worried because he is traveling to [facility] trauma by car. Finally we will be rewrapping him. There is no open area on the right leg over his left leg has multiple weeping areas as usual 02/09/18; The same wrap on for 10 days. He did not pick up the last doxycycline I prescribed for him. He apparently took 4 days worth he already had. There is nothing open on  his right leg and the edema control is really quite good. He's had damage in the left leg medially and laterally especially probably related to the prolonged use of Unna boots 02/12/18; the patient arrived in clinic today for a nurse visit/wrap change. He complained of a lot of pain in the left posterior calf. He is taking doxycycline that I previously prescribed for him. Unfortunately even though he used his stockings and apparently used to compression pumps twice a day he has weeping edema coming out of the lateral part of his right leg. This is coming from the lower anterior lateral skin area. 02/16/18; the patient has finished his doxycycline and will finish the amoxicillin 2 days. The area of cellulitis in the left calf posteriorly has resolved. He is no longer having any pain. He tells me he is using his compression pumps at least once a day sometimes twice. 02/23/18; the patient finished his doxycycline and Amoxil last week. On Friday he noticed a small erythematous circle about the size of a quarter on the left lower leg just above his ankle. This rapidly expanded and he now has erythema on the lateral and posterior part of the thigh. This is bright red. Also has an area on the dorsal foot just above his toes and a tender area just below the left popliteal fossa. He came off his prophylactic penicillin injections at his own insistence one or 2 months ago. This is obviously deteriorated since then 03/02/18; patient is on doxycycline and Amoxil. Culture I did last week of the weeping area on the back of his left calf grew group B strep. I have therefore renewed the amoxicillin 500 3 times a day for a further week. He has not been systemically unwell. Still complaining of an area of discomfort right under his left popliteal fossa. There is no open wound on the right leg. He tells me that he is using his pumps twice a day on most days 03/09/18; patient arrives in clinic today completing his  amoxicillin  today. The cellulitis on his left leg is better. Furthermore he tells me that he had intramuscular penicillin shots that his primary care office today. However he also states that the wrap on his right leg fell down shortly after leaving clinic last week. He developed a large blister that was present when he came in for a nurse visit later in the week and then he developed intense discomfort around this area.He tells me he is using his compression pumps 03/16/18; the patient has completed his doxycycline. The infectious part of this/cellulitis in the left heel area left popliteal area is a lot better. He has 2 open areas on the right calf. Still areas on the left calf but this is a lot better as well. 03/24/18; the patient arrives complaining of pain in the left popliteal area again. He thinks some of this is wrap injury. He has no open area on the right leg and really no open area on the left calf either except for the popliteal area. He claims to be compliant with the compression pumps 03/31/18; I gave him doxycycline last week because of cellulitis in the left popliteal area. This is a lot better although the surface epithelium is denuded off and response to this. He arrives today with uncontrolled edema in the right calf area as well as a fingernail injury in the right lateral calf. There is only a few open areas on the left 04/06/18; I gave him amoxicillin doxycycline over the last 2 weeks that the amoxicillin should be completing currently. He is not complaining of any pain or systemic symptoms. The only open areas see has is on the right lateral lower leg paradoxically I cannot see anything on the left lower leg. He tells me he is using his compression pumps twice a day on most days. Silver alginate to the wounds that are open under 4 layer compression 04/13/18; he completed antibiotics and has no new complaints. Using his compression pumps. Silver alginate that anything that's opened 04/20/18; he is  using his compression pumps religiously. Silver alginate 4 layer compression anything that's opened. He comes in today with no open wounds on the left leg but 3 on the right including a new one posteriorly. He has 2 on the right lateral and one on the right posterior. He likes Unna boots on the right leg for reasons that aren't really clear we had the usual 4 layer compression on the left. It may be necessary to move to the 4 layer compression on the right however for now I left them in the Unna boots 04/27/18; he is using his compression pumps at least once a day. He has still the wounds on the right lateral calf. The area right posteriorly has closed. He does not have an open wound on the left under 4 layer compression however on the dorsal left foot just proximal to the toes and the left third toe 2 small open areas were identified 05/11/18; he has not uses compression pumps. The areas on the right lateral calf have coalesced into one large wound necrotic surface. On the left side he has one small wound anteriorly however the edema is now weeping out of a large part of his left leg. He says he wasn't using his pumps because of the weeping fluid. I explained to him that this is the time he needs to pump more 05/18/18; patient states he is using his compression pumps twice a day. The area on the right lateral large  wound albeit superficial. On the left side he has innumerable number of small new wounds on the left calf particularly laterally but several anteriorly and medially. All these appear to have healthy granulated base these look like the remnants of blisters however they occurred under compression. The patient arrives in clinic today with his legs somewhat better. There is certainly less edema, less multiple open areas on the left calf and the right anterior leg looks somewhat better as well superficial and a little smaller. However he relates pain and erythema over the last 3-4 days in the thigh  and I looked at this today. He has not been systemically unwell no fever no chills no change in blood sugar values 05/25/18; comes in today in a better state. The severe cellulitis on his left leg seems better with the Keflex. Not as tender. He has not been systemically unwell Hard to find an open wound on the left lower leg using his compression pumps twice a day The confluent wounds on his right lateral calf somewhat better looking. These will ultimately need debridement I didn't do this today. 06/01/18; the severe cellulitis on the left anterior thigh has resolved and he is completed his Keflex. There is no open wound on the left leg however there is a superficial excoriation at the base of the third toe dorsally. Skin on the bottom of his left foot is macerated looking. The left the wounds on the lateral right leg actually looks some better although he did require debridement of the top half of this wound area with an open curet 06/09/18 on evaluation today patient appears to be doing poorly in regard to his right lower extremity in particular this appears to likely be infected he has very thick purulent discharge along with a bright green tent to the discharge. This makes me concerned about the possibility of pseudomonas. He's also having increased discomfort at this point on evaluation. Fortunately there does not appear to be any evidence of infection spreading to the other location at this time. 06/16/18 on evaluation today patient appears to actually be doing fairly well. His ulcer has actually diminished in size quite significantly at this point which is good news. Nonetheless he still does have some evidence of infection he did see infectious disease this morning before coming here for his appointment. I did review the results of their evaluation and their note today. They did actually have him discontinue the Cipro and initiate treatment with linezolid at this time. He is doing this for the  next seven days and they recommended a follow-up in four months with them. He is the keep a log of the need for intermittent antibiotic therapy between now and when he falls back up with infectious disease. This will help them gaze what exactly they need to do to try and help them out. 06/23/18; the patient arrives today with no open wounds on the left leg and left third toe healed. He is been using his compression pumps twice a day. On the right lateral leg he still has a sizable wound but this is a lot better than last time I saw this. 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 Has been using silver alginate under 4 layer compression 06/30/18; the only open wound he has is on the right lateral leg and this looks healthy. No debridement is required. We have been using silver alginate.  He does not have an open wound on the left leg. There is apparently some drainage from the dorsal proximal third toe on the left although I see no open wound here. 07/03/18 on evaluation today patient was actually here just for a nurse visit rapid change. However when he was here on Wednesday for his rat change due to having been healed on the left and then developing blisters we initiated the wrap again knowing that he would be back today for [place] to reevaluate and see were at. Unfortunately he has developed some cellulitis into the proximal portion of his right lower extremity even into the region of his thigh. 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 He was placed on one as what at that point. Nonetheless he is done with that and has been tolerating it well otherwise. Doxycycline which in the past really did not seem to be effective for him. Nonetheless I think the best option may be for [place] to definitely reinitiate the antibiotics for a longer period of time. 07/07/18; since I  last saw this patient a week ago he has had a difficult time. At that point he did not have an open wound on his left leg. We transitioned him into juxta light stockings. He was apparently in the clinic the next day with blisters on the left lateral and left medial lower calf. He also had weeping edema fluid. He was put back into a compression wrap. He was also in the clinic on Friday with intense erythema in his right thigh. Per the patient he was started on Bactrim however that didn't work at all in terms of relieving his pain and swelling. He has taken 3 doxycycline that he had left over from last time and that seems to of helped. He has blistering on the right thigh as well. 07/14/18; the erythema on his right thigh has gotten better with doxycycline that he is finishing. 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 Presumably this cellulitis in the thigh was not related to that although I think this is a bit concerning going forward. He still has an area on the right lateral calf the blister on the right medial calf just below the knee that was discussed above. On the left 2 small open areas left medial and left lateral. Edema control is adequate. He is using his compression pumps twice a day 07/20/18; continued improvement in the condition of both legs especially the edema in his bilateral thighs. He tells me he is been losing weight through a combination of diet and exercise. He is using his compression pumps twice a day. So overall she made to the remaining wounds 07/27/2018; continued improvement in condition of both legs. His edema is well controlled. The area on the right lateral leg is just about closed he had one blisters show up on the medial left upper calf. We have him in 4 layer compression. He is going on a 10-day trip to [place]. He will be driving. He wants to wear Unna boots because of  the lessening amount of constriction. He will not use compression pumps while he is away 08/05/18 on evaluation today patient actually appears to be doing decently well all things considered in regard to his bilateral lower extremities. The worst ulcer is actually only posterior aspect of his left lower extremity  with a four layer compression wrap cut into his leg a couple weeks back. He did have a trip and actually had [employer] for the trip that he is worn since he was last here. Nonetheless he feels like the [employer] actually do better for him his swelling is up a little bit but he also with his trip was not taking his Lasix on a regular set schedule like he was supposed to be. He states that obviously the reason being that he cannot drive and keep going without having to urinate too frequently which makes it difficult. He did not have his pumps with him while he was away either which I think also maybe playing a role here too. 08/13/2018; the patient only has a small open wound on the right lateral calf which is a big improvement in the last month or 2. He also has the area posteriorly just below the posterior fossa on the left which I think was a wrap injury from several weeks ago. He has no current evidence of cellulitis. He tells me he is back into his compression pumps twice a day. He also tells me that while he was at the laundromat somebody stole a section of his extremitease stockings 08/20/2018; back in the clinic with a much improved state. He only has small areas on the right lateral mid calf which is just about healed. This was is more substantial area for quite a prolonged period of time. He has a small open area on the left anterior tibia. The area on the posterior calf just below the popliteal fossa is closed today. He is using his compression pumps twice a day 08/28/2018; patient has no open wound on the right leg. He has a smattering of open areas on the calf with some  weeping lymphedema. More problematically than that it looks as though his wraps of slipped down in his usual he has very angry upper area of edema just below the right medial knee and on the right lateral calf. He has no open area on his feet. The patient is traveling to [facility] next week. I will send him in an antibiotic. We will continue to wrap the right leg. We ordered extremitease stockings for him last week and I plan to transition the right leg to a stocking when he gets home which will be in 10 days time. As usual he is very reluctant to take his pumps with him when he travels 09/07/2018; patient returns from [facility]. He shows me a picture of his left leg in the mid part of his trip last week with intense fire engine erythema. The picture look bad enough I would have considered sending him to the hospital. Instead he went to the wound care center in [facility]. They did not prescribe him antibiotics but he did take some doxycycline he had leftover from a previous visit. I had given him trimethoprim sulfamethoxazole before he left this did not work according to the patient. This is resulted in some improvement fortunately. He comes back with a large wound on the left posterior calf. Smaller area on the left anterior tibia. Denuded blisters on the dorsal left foot over his toes. Does not have much in the way of wounds on the right leg although he does have a very tender area on the right posterior area just below the popliteal fossa also suggestive of infection. He promises me he is back on his pumps twice a day  09/15/2018; the intense cellulitis in his left lower calf is a lot better. The wound area on the posterior left calf is also so better. However he has reasonably extensive wounds on the dorsal aspect of his second and third toes and the proximal foot just at the base of the toes. There is nothing open on the right leg 09/22/2018; the patient has excellent edema  control in his legs bilaterally. He is using his external compression pumps twice a day. He has no open area on the right leg and only the areas in the left foot dorsally second and third toe area on the left side. He does not have any signs of active cellulitis. 10/06/2018; the patient has good edema control bilaterally. He has no open wound on the right leg. There is a blister in the posterior aspect of his left calf that we had to deal with today. He is using his compression pumps twice a day. There is no signs of active cellulitis. We have been using silver alginate to the wound areas. He still has vulnerable areas on the base of his left first second toes dorsally He has a his extremities stockings and we are going to transition him today into the stocking on the right leg. He is cautioned that he will need to continue to use the compression pumps twice a day. If he notices uncontrolled edema in the right leg he may need to go to 3 times a day. 10/13/2018; the patient came in for a nurse check on Friday he has a large flaccid blister on the right medial calf just below the knee. We unroofed this. He has this and a new area underneath the posterior mid calf which was undoubtedly a blister as well. He also has several small areas on the right which is the area we put his extremities stocking on. 10/19/2018; the patient went to see infectious disease this morning I am not sure if that was a routine follow-up in any case the doxycycline I had given him was discontinued and started on linezolid. He has not started this. It is easy to look at his left calf and the inflammation and think this is cellulitis however he is very tender in the tissue just below the popliteal fossa and I have no doubt that there is infection going on here. He states the problem he is having is that with the compression pumps the edema goes down and then starts walking the wrap falls down. We will see if we can adhere this. He  has 1 or 2 minuscule open areas on the right still areas that are weeping on the posterior left calf, the base of his left second and third toes 10/26/18; back today in clinic with quite of skin breakdown in his left anterior leg. This may have been infection the area below the popliteal fossa seems a lot better however tremendous epithelial loss on the left anterior mid tibia area over quite inexpensive tissue. He has 2 blisters on the right side but no other open wound here. 10/29/2018; came in urgently to see [place] today and we worked him in for review. He states that the 4 layer compression on the right leg caused pain he had to cut it down to roughly his mid calf this caused swelling above the wrap and he has blisters and skin breakdown today. As a result of the pain he has not been using his pumps. Both legs are a lot more edematous and there is  a lot of weeping fluid. 11/02/18; arrives in clinic with continued difficulties in the right leg> left. Leg is swollen and painful. multiple skin blisters and new open areas especially laterally. He has not been using his pumps on the right leg. 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 He is not systemically unwell. 11/09/2018; the patient claims he is being compliant with his pumps. He is finished the doxycycline I gave him last week. Culture I did of the wound on the right lateral leg showed a few very resistant methicillin staph aureus. This was resistant to doxycycline. Nevertheless he states the pain in the leg is a lot better which makes me wonder if the cultured organism was not really what was causing the problem nevertheless this is a very dangerous organism to be culturing out of any wound. His right leg is still a lot larger than the left. He is using an Radio broadcast assistant on this area, he blames a 4-layer compression for causing the original skin breakdown which I doubt is true however I cannot talk him out  of it. We have been using silver alginate to all of these areas which were initially blisters 11/16/2018; patient is being compliant with his external compression pumps at twice a day. Miraculously he arrives in clinic today with absolutely no open wounds. He has better edema control on the left where he has been using 4 layer compression versus wound of wounds on the right and I pointed this out to him. There is no inflammation in the skin in his lower legs which is also somewhat unusual for him. There is no open wounds on the dorsal left foot. He has extremitease stockings at home and I have asked him to bring these in next week. 11/25/18 patient's lower extremity on examination today on the left appears for the most part to be wound free. He does have an open wound on the lateral aspect of the right lower extremity but this is minimal compared to what I've seen in past. He does request that we go ahead and wrap the left leg as well even though there's nothing open just so hopefully it will not reopen in short order. 1/28; patient has superficial open wounds on the right lateral calf left anterior calf and left posterior calf. His edema control is adequate. 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 We have been using silver alginate as the primary dressing. He claims compliance with his compression pumps 2/4; patient has superficial open wounds on numerous areas of his left calf and again one on the left dorsal foot. The areas on the right lateral calf have healed. The cellulitis that I gave him doxycycline for last week is also resolved this was mostly on the left anterior calf just below the tibial tuberosity. His edema looks fairly well-controlled. He tells me he went to see his primary doctor today and had blood work ordered 2/11; once again he has several open areas on the left calf left tibial area. Most of  these are small and appear to have healthy granulation. He does not have anything open on the right. The edema and control in his thighs is pretty good which is usually a good indication he has been using his pumps as requested. 2/18; he continues to have several small areas on the left calf and left tibial area. Most of these  are small healthy granulation. We put him in his stocking on the right leg last week and he arrives with a superficial open area over the right upper tibia and a fairly large area on the right lateral tibia in similar condition. His edema control actually does not look too bad, he claims to be using his compression pumps twice a day 2/25. Continued small areas on the left calf and left tibial area. New areas especially on the right are identified just below the tibial tuberosity and on the right upper tibia itself. There are also areas of weeping edema fluid even without an obvious wound. He does not have a considerable degree of lymphedema but clearly there is more edema here than his skin can handle. He states he is using the pumps twice a day. We have an Unna boot on the right and 4 layer compression on the left. 3/3; he continues to have an area on the right lateral calf and right posterior calf just below the popliteal fossa. There is a fair amount of tenderness around the wound on the popliteal fossa but I did not see any evidence of cellulitis, could just be that the wrap came down and rubbed in this area. He does not have an open area on the left leg however there is an area on the left dorsal foot at the base of the third toe We have been using silver alginate to all wound areas 3/10; he did not have an open area on his left leg last time he was here a week ago. T oday he arrives with a horizontal wound just below the tibial tuberosity and an area on the left lateral calf. He has intense erythema and tenderness in this area. The area is on the right lateral calf and  right posterior calf better than last week. We have been using silver alginate as usual 3/18 - Patient returns with 3 small open areas on left calf, and 1 small open area on right calf, the skin looks ok with no significant erythema, he continues the UNA boot on right and 4 layer compression on left. The right lateral calf wound is closed , the right posterior is small area. we will continue silver alginate to the areas. 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 He is been tolerating the dressing changes without complication. Fortunately she has made excellent improvement in regard to the overall status of his wounds. Unfortunately every time we cease wrapping him he ends up reopening in causing more significant issues at that point. Again I'm unsure of the best direction to take although I think the lymphedema clinic may be appropriate for him. 02/03/19 on evaluation today patient appears to be doing well in regard to the wounds that we saw him for last week unfortunately he has a new area on the proximal portion of his right medial/posterior lower extremity where the wrap somewhat slowed down and caused swelling and a blister to rub and open. Unfortunately this is the only opening that he has on either leg at this point. 02/17/19 on evaluation today patient's bilateral lower extremities appear to be doing well. He still completely healed in regard to the left lower extremity. In regard to the right lower extremity the area where the wrap and slid down and caused the blister  still seems to be slightly open although this is dramatically better than during the last evaluation two weeks ago. I'm very pleased with the way this stands overall. 03/03/19 on evaluation today patient appears to be doing well in regard  to his right lower extremity in general although he did have a new blister open this does not appear to be showing any evidence of active infection at this time. Fortunately there's No fevers, chills, nausea, or vomiting noted at this time. Overall I feel like he is making good progress it does feel like that the right leg will we perform the [employer] seems to do with a bit better than three layer wrap on the left which slid down on him. We may switch to doing bilateral in the book wraps. 5/4; I have not seen Mr. Harp in quite some time. According to our case manager he did not have an open wound on his left leg last week. He had 1 remaining wound on the right posterior medial calf. He arrives today with multiple openings on the left leg probably were blisters and/or wrap injuries from Unna boots. I do not think the Unna boot's will provide adequate compression on the left. I am also not clear about the frequency he is using the compression pumps. 03/17/19 on evaluation today patient appears to be doing excellent in regard to his lower extremities compared to last week's evaluation apparently. He had gotten significantly worse last week which is unfortunate. The [employer] wrap on the left did not seem to do very well for him at all and in fact it didn't control his swelling significantly enough he had an additional outbreak. Subsequently we go back to the four layer compression wrap on the left. This is good news. At least in that he is doing better and the wound seem to be killing him. He still has not heard anything from the lymphedema clinic. 03/24/19 on evaluation today patient actually appears to be doing much better in regard to his bilateral lower Trinity as compared to last week when I saw him. Fortunately there's no signs of active infection at this time. He has been tolerating the dressing changes without complication. Overall I'm extremely pleased with the progress and appearance in  general. 04/07/19 on evaluation today patient appears to be doing well in regard to his bilateral lower extremities. His swelling is significantly down from where it was previous. With that being said he does have a couple blisters still open at this point but fortunately nothing that seems to be too severe and again the majority of the larger openings has healed at this time. 04/14/19 on evaluation today patient actually appears to be doing quite well in regard to his bilateral lower extremities in fact I'm not even sure there's anything significantly open at this time at any site. Nonetheless he did have some trouble with these wraps where they are somewhat irritating him secondary to the fact that he has noted that the graph wasn't too close down to the end of this foot in a little bit short as well up to his knee. Otherwise things seem to be doing quite well. 04/21/19 upon evaluation today patient's wound bed actually showed evidence of being completely healed in regard to both lower extremities which is excellent news. There does not appear to be any signs of active infection which is also good news. I'm very pleased in this regard. No fevers, chills, nausea, or vomiting noted at this time. 04/28/19  on evaluation today patient appears to be doing a little bit worse in regard to both lower extremities on the left mainly due to the fact that when he went infection disease the wrap was not wrapped quite high enough he developed a blister above this. On the right he is a small open area of nothing too significant but again this is continuing to give him some trouble he has been were in the Velcro compression that he has at home. 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 He's been tolerating the dressing changes without complication. Fortunately there's no signs of active infection at this time. No fevers, chills, nausea, or vomiting noted at  this time. We have been trying to get an appointment with her lymphedema clinic in [facility] but unfortunately nobody can get them on phone with not been able to even fax information over the patient likewise is not been able to get in touch with them. Overall I'm not sure exactly what's going on here with to reach out again today. 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 Still having a lot of drainage unfortunately. He tells me especially in the left but even on the right. There's no signs of active infection which is good news we've been using so ratcheted up to this point. 05/19/19 on evaluation today patient actually appears to be doing quite well with regard to his left lower extremity which is great news. Fortunately in regard to the right lower extremity has an issues with his wrap and he subsequently did remove this from what I'm understanding. Nonetheless long story short is what he had rewrapped once he removed it subsequently had maggots underneath this wrap whenever he came in for evaluation today. With that being said they were obviously completely cleaned away by the nursing staff. The visit today which is excellent news. However he does appear to potentially have some infection around the right ankle region where the maggots were located as well. He will likely require anabiotic therapy today. 05/26/19 on evaluation today patient actually appears to be doing much better in regard to his bilateral lower extremities. I feel like the infection is under much better control. With that being said there were maggots noted when the wrap was removed yet again today. Again this could have potentially been left over from previous although at this time there does not appear to be any signs of significant drainage there was obviously on the wrap some drainage as well this contracted gnats or  otherwise. Either way I do not see anything that appears to be doing worse in my pinion and in fact I think his drainage has slowed down quite significantly likely mainly due to the fact to his infection being under better control. 06/02/2019 on evaluation today patient actually appears to be doing well with regard to his bilateral lower extremities there is no signs of active infection at this time which is great news. With that being said he does have several open areas more so on the right than the left but nonetheless these are all significantly better than previously noted. 06/09/2019 on evaluation today patient actually appears to be doing well. His wrap stayed up and he did not cause any problems he had more drainage on the right compared to the left but overall I do not see any major issues at  this time which is great news. 06/16/2019 on evaluation today patient appears to be doing excellent with regard to his lower extremities the only area that is open is a new blister that can have opened as of today on the medial ankle on the left. Other than this he really seems to be doing great I see no major issues at this point. 06/23/2019 on evaluation today patient appears to be doing quite well with regard to his bilateral lower extremities. In fact he actually appears to be almost completely healed there is a small area of weeping noted of the right lower extremity just above the ankle. Nonetheless fortunately there is no signs of active infection at this time which is good news. No fevers, chills, nausea, vomiting, or diarrhea. 8/24; the patient arrived for a nurse visit today but complained of very significant pain in the left leg and therefore I was asked to look at this. Noted that he did not have an open area on the left leg last week nevertheless this was wrapped. The patient states that he is not been able to put his compression pumps on the left leg because of the discomfort. He has not been  systemically unwell 06/30/2019 on evaluation today patient unfortunately despite being excellent last week is doing much worse with regard to his left lower extremity today. In fact he had to come in for a nurse on Monday where his left leg had to be rewrapped due to excessive weeping Dr. Leanord Hawking placed him on doxycycline at that point. Fortunately there is no signs of active infection Systemically at this time which is good news. 07/07/2019 in regard to the patient's wounds today he actually seems to be doing well with his right lower extremity there really is nothing open or draining at this point this is great news. Unfortunately the left lower extremity is given him additional trouble at this time. There does not appear to be any signs of active infection nonetheless he does have a lot of edema and swelling noted at this point as well as blistering all of which has led to a much more poor appearing leg at this time compared to where it was 2 weeks ago when it was almost completely healed. Obviously this is a little discouraging for the patient. He is try to contact the lymphedema clinic in [place] he has not been able to get through to them. 07/14/2019 on evaluation today patient actually appears to be doing slightly better with regard to his left lower extremity ulcers. Overall I do feel like at least at the top of the wrap that we have been placing this area has healed quite nicely and looks much better. The remainder of the leg is showing signs of improvement. Unfortunately in the thigh area he still has an open region on the left and again on the right he has been utilizing just a Band-Aid on an area that also opened on the thigh. Again this is an area that were not able to wrap although we did do an Ace wrap to provide some compression that something that obviously is a little less effective than the compression wraps we have been using on the lower portion of the leg. He does have an  appointment with the lymphedema clinic in [facility] on Friday. 07/21/2019 on evaluation today patient appears to be doing better with regard to his lower extremity ulcers. He has been tolerating the dressing changes without complication. Fortunately there is no signs of active  infection at this time. No fevers, chills, nausea, vomiting, or diarrhea. I did receive the paperwork from the physical therapist at the lymphedema clinic in [place]. Subsequently I signed off on that this morning and sent that back to him for further progression with the treatment plan. 07/28/2019 on evaluation today patient appears to be doing very well with regard to his right lower extremity where I do not see any open wounds at this point. Fortunately he is feeling great as far as that is concerned as well. In regard to the left lower extremity he has been having issues with still several areas of weeping and edema although the upper leg is doing better his lower leg still I think is going require the compression wrap at this time. No fevers, chills, nausea, vomiting, or diarrhea. 08/04/2019 on evaluation today patient unfortunately is having new wounds on the right lower extremity. Again we have been using Unna boot wrap on that side. We switched him to using his juxta lite wrap at home. With that being said he tells me he has been using it although his legs extremely swollen and to be honest really does not appear that he has been. I cannot know that for sure however. Nonetheless he has multiple new wounds on the right lower extremity at this time. Obviously we will have to see about getting this rewrapped for him today. 08/11/2019 on evaluation today patient appears to be doing fairly well with regard to his wounds. He has been tolerating the dressing changes including the compression wraps without complication. He still has a lot of edema in his upper thigh regions bilaterally he is supposed to be seeing the  lymphedema clinic on the 15th of this month once his wraps arrive for the upper part of his legs. 08/18/2019 on evaluation today patient appears to be doing well with regard to his bilateral lower extremities at this point. He has been tolerating the dressing changes without complication. Fortunately there is no signs of active infection which is also good news. He does have a couple weeping areas on the first and second toe of the right foot he also has just a small area on the left foot upper leg and a small area on the left lower leg but overall he is doing quite well in my opinion. He is supposed to be getting his wraps shortly in fact tomorrow and then subsequently is seeing the lymphedema clinic next Wednesday on the 21st. Of note he is also leaving on the 25th to go on vacation for a week to the beach. For that reason and since there is some uncertainty about what there can be doing at lymphedema clinic next Wednesday I am get a make an appointment for next Friday here for [place] to see what we need to do for him prior to him leaving for vacation. 10/23; patient arrives in considerable pain predominantly in the upper posterior calf just distal to the popliteal fossa also in the wound anteriorly above the major wound. This is probably cellulitis and he has had this recurrently in the past. He has no open wound on the right side and he has had an Radio broadcast assistant in that area. Finally I note that he has an area on the left posterior calf which by enlarge is mostly epithelialized. This protrudes beyond the borders of the surrounding skin in the setting of dry scaly skin and lymphedema. The patient is leaving for [facility] on Sunday. Per his longstanding pattern, he  will not take his compression pumps with him predominantly out of fear that they will be stolen. He therefore asked that we put a Unna boot back on the right leg. He will also contact the wound care center in [facility] to see if they can  change his dressing in the mid week. 11/3; patient returned from his vacation to [facility]. He was seen on 1 occasion at their wound care center. They did a 2 layer compression system as they did not have our 4-layer wrap. I am not completely certain what they put on the wounds. They did not change the Unna boot on the right. The patient is also seeing a lymphedema specialist physical therapist in [place]. It appears that he has some compression sleeve for his thighs which indeed look quite a bit better than I am used to seeing. He pumps over these with his external compression pumps. 11/10; the patient has a new wound on the right medial thigh otherwise there is no open areas on the right. He has an area on the left leg posteriorly anteriorly and medially and an area over the left second toe. We have been using silver alginate. He thinks the injury on his thigh is secondary to friction from the compression sleeve he has. 11/17; the patient has a new wound on the right medial thigh last week. He thinks this is because he did not have a underlying stocking for his thigh juxta lite apparatus. He now has this. The area is fairly large and somewhat angry but I do not think he has underlying cellulitis. He has a intact blister on the right anterior tibial area. Small wound on the right great toe dorsally Small area on the medial left calf. 11/30; the patient does not have any open areas on his right leg and we did not take his juxta lite stocking off. However he states that on Friday his compression wrap fell down lodging around his upper mid calf area. As usual this creates a lot of problems for him. He called urgently today to be seen for a nurse visit however the nurse visit turned into a provider visit because of extreme erythema and pain in the left anterior tibia extending laterally and posteriorly. The area that is problematic is extensive 10/06/2019 upon evaluation today patient actually  appears to be doing poorly in regard to his left lower extremity. He Dr. Leanord Hawking did place him on doxycycline this past Monday apparently due to the fact that he was doing much worse in regard to this left leg. Fortunately the doxycycline does seem to be helping. Unfortunately we are still having a very difficult time getting his edema under any type of control in order to anticipate discharge at some point. The only way were really able to control his lymphedema really is with compression wraps and that has only even seemingly temporary. He has been seeing a lymphedema clinic they are trying to help in this regard but still this has been somewhat frustrating in general for the patient. 10/13/19 on evaluation today patient appears to be doing excellent with regard to his right lower extremity as far as the wounds are concerned. His swelling is still quite extensive unfortunately. He is still having a lot of drainage from the thigh areas bilaterally which is unfortunate. He's been going to lymphedema clinic but again he still really does not have this edema under control as far as his lower extremities are concern. With regard to his left lower  extremity this seems to be improving and I do believe the doxycycline has been of benefit for him. He is about to complete the doxycycline. 10/20/2019 on evaluation today patient appears to be doing poorly in regard to his bilateral lower extremities. More in the right thigh he has a lot of irritation at this site unfortunately. In regard to the left lower extremity the wrap was not quite as high it appears and does seem to have caused him some trouble as well. Fortunately there is no evidence of systemic infection though he does have some blue-green drainage which has me concerned for the possibility of Pseudomonas. He tells me he is previously taking Cipro without complications and he really does not care for Levaquin however due to some of the side effects he  has. He is not allergic to any medications specifically antibiotics that were aware of. 10/27/2019 on evaluation today patient actually does appear to be for the most part doing better when compared to last week's evaluation. With that being said he still has multiple open wounds over the bilateral lower extremities. He actually forgot to start taking the Cipro and states that he still has the whole bottle. He does have several new blisters on left lower extremity today I think I would recommend he go ahead and take the Cipro based on what I am seeing at this point. 12/30-Patient comes at 1 week visit, 4 layer compression wraps on the left and Unna boot on the right, primary dressing Xtrasorb and silver alginate. Patient is taking his Cipro and has a few more days left probably 5-6, and the legs are doing better. He states he is using his compressions devices which I believe he has 11/10/2019 on evaluation today patient actually appears to be much better than last time I saw him 2 weeks ago. His wounds are significantly improved and overall I am very pleased in this regard. Fortunately there is no signs of active infection at this time. He is just a couple of days away from completing Cipro. Overall his edema is much better he has been using his lymphedema pumps which I think is also helping at this point. 11/17/2019 on evaluation today patient appears to be doing excellent in regard to his wounds in general. His legs are swollen but not nearly as much as they have been in the past. Fortunately he is tolerating the compression wraps without complication. No fevers, chills, nausea, vomiting, or diarrhea. He does have some erythema however in the distal portion of his right lower extremity specifically around the forefoot and toes there is a little bit of warmth here as well. 11/24/2019 on evaluation today patient appears to be doing well with regard to his right lower extremity I really do not see any open  wounds at this point. His left lower extremity does have several open areas and his right medial thigh also is open. Other than this however overall the patient seems to be making good progress and I am very pleased at this point. 12/01/2019 on evaluation today patient appears to be doing poorly at this point in regard to his left lower extremity has several new blisters despite the fact that we have him in compression wraps. In fact he had a 4-layer compression wrap, his upper thigh wrapped from lymphedema clinic, and a juxta light over top of the 4 layer compression wrap the lymphedema clinic applied and despite all this he still develop blisters underneath. Obviously this does have me concerned about  the fact that unfortunately despite what we are doing to try to get wounds healed he continues to have new areas arise I do not think he is ever good to be at the point where he can realistically just use wraps at home to keep things under control. Typically when we heal him it takes about 1-2 days before he is back in the clinic with severe breakdown and blistering of his lower extremities bilaterally. This is happened numerous times in the past. Unfortunately I think that we may need some help as far as overall fluid overload to kind of limit what we are seeing and get things under better control. 12/08/2019 on evaluation today patient presents for follow-up concerning his ongoing bilateral lower extremity edema. Unfortunately he is still having quite a bit of swelling the compression wraps are controlling this to some degree but he did see Dr. [name] his cardiologist I do have that available for review today as far as the appointment was concerned that was on 12/06/2019. Obviously that she has been 2 days ago. The patient states that he is only been taking the Lasix 80 mg 1 time a day he had told me previously he was taking this twice a day. Nonetheless Dr. [name] recommended this be up to 80 mg 2 times a  day for the patient as he did appear to be fluid overloaded. With that being said the patient states he did this yesterday and he was unable to go anywhere or do anything due to the fact that he was constantly having to urinate. Nonetheless I think that this is still good to be something that is important for him as far as trying to get his edema under control at all things that he is going to be able to just expect his wounds to get under control and things to be better without going through at least a period of time where he is trying to stabilize his fluid management in general and I think increasing the Lasix is likely the first step here. It was also mentioned the possibility that the patient may require metolazone. With that being said he wanted to have the patient take Lasix twice a day first and then reevaluating 2 months to see where things stand. 12/15/2019 upon evaluation today patient appears to be doing regard to his legs although his toes are showing some signs of weeping especially on the left at this point to some degree on the right. There does not appear to be any signs of active infection and overall I do feel like the compression wraps are doing well for him but he has not been able to take the Lasix at home and the increased dose that Dr. [name] recommended. He tells me that just not go to be feasible for him. Nonetheless I think in this case he should probably send a message to Dr. [name] in order to discuss options from the standpoint of possible admission to get the fluid off or otherwise going forward. 12/22/2019 upon evaluation today patient appears to be doing fairly well with regard to his lower extremities at this point. In fact he would be doing excellent if it was not for the fact that his right anterior thigh apparently had an allergic reaction to adhesive tape that he used. The wound itself that we have been monitoring actually appears to be healed. There is a lot of  irritation at this point. 12/29/2019 upon evaluation today patient appears to be doing well in  regard to his lower extremities. His left medial thigh is open and somewhat draining today but this is the only region that is open the right has done much better with the treatment utilizing the steroid cream that I prescribed for him last week. Overall I am pleased in that regard. Fortunately there is no signs of active infection at this time. No fevers, chills, nausea, vomiting, or diarrhea. 01/05/2020 upon evaluation today patient appears to be doing more poorly in regard to his right lower extremity at this point upon evaluation today. Unfortunately he continues to have issues in this regard and I think the biggest issue is controlling his edema. This obviously is not very well controlled at this point is been recommended that he use the Lasix twice a day but he has not been able to do that. Unfortunately I think this is leading to an issue where honestly he is not really able to effectively control his edema and therefore the wounds really are not doing significantly better. I do not think that he is going to be able to keep things under good control unless he is able to control his edema much better. I discussed this again in great detail with him today. 01/12/2020 good news is patient actually appears to be doing quite well today at this point. He does have an appointment with lymphedema clinic tomorrow. His legs appear healed and the toe on the left is almost completely healed. In general I am very pleased with how things stand at this point. 01/19/2020 upon evaluation today patient appears to actually be doing well in regard to his lower extremities there is nothing open at this point. Fortunately he has done extremely well more recently. Has been seeing lymphedema clinic as well. With that being said he has Velcro wraps for his lower legs as well as his upper legs. The only wound really is on his toe  which is the right great toe and this is barely anything even there. With all that being said I think it is good to be appropriate today to go ahead and switch him over to the Velcro compression wraps. 01/26/2020 upon evaluation today patient appears to be doing worse with regard to his lower extremities after last week switch him to Velcro compression wraps. Unfortunately he lasted less than 24 hours he did not have the sock portion of his Velcro wrap on the left leg and subsequently developed a blister underneath the Velcro portion. Obviously this is not good and not what we were looking for at this point. He states the lymphedema clinic did tell him to wear the wrap for 23 hours and take him off for 1 I am okay with that plan but again right now we got a get things back under control again he may have some cellulitis noted as well. 02/02/2020 upon evaluation today patient unfortunately appears to have several areas of blistering on his bilateral lower extremities today mainly on the feet. His legs do seem to be doing somewhat better which is good news. Fortunately there is no evidence of active infection at this time. No fevers, chills, nausea, vomiting, or diarrhea. 02/16/2020 upon evaluation today patient appears to be doing well at this time with regard to his legs. He has a couple weeping areas on his toes but for the most part everything is doing better and does appear to be sealed up on his legs which is excellent news. We can continue with wrapping him at this point as  he had every time we discontinue the wraps he just breaks out with new wounds. There is really no point in is going forward with this at this point. 03/08/2020 upon evaluation today patient actually appears to be doing quite well with regard to his lower extremity ulcers. He has just a very superficial and really almost nonexistent blister on the left lower extremity he has in general done very well with the compression wraps. With  that being said I do not see any signs of infection at this time which is good news. 03/29/2020 upon evaluation today patient appears to be doing well with regard to his wounds currently except for where he had several new areas that opened up due to some of the wrap slipping and causing him trouble. He states he did not realize they had slipped. Nonetheless he has a 1 area on the right and 3 new areas on the left. Fortunately there is no signs of active infection at this time which is great news. 04/05/2020 upon evaluation today patient actually appears to be doing quite well in general in regard to his legs currently. Fortunately there is no signs of active infection at this time. No fevers, chills, nausea, vomiting, or diarrhea. He tells me next week that he will actually be seen in the lymphedema clinic on Thursday at 10 AM I see him on Wednesday next week. 04/12/2020 upon evaluation today patient appears to be doing very well with regard to his lower extremities bilaterally. In fact he does not appear to have any open wounds at this point which is good news. Fortunately there is no signs of active infection at this time. No fevers, chills, nausea, vomiting, or diarrhea. 04/19/2020 upon evaluation today patient appears to be doing well with regard to his wounds currently on the bilateral lower extremities. There does not appear to be any signs of active infection at this time. Fortunately there is no evidence of systemic infection and overall very pleased at this point. Nonetheless after I held him out last week he literally had blisters the next morning already which swelled up with him being right back here in the clinic. Overall I think that he is just not can be able to be discharged with his legs the way they are he is much to volume overloaded as far as fluid is concerned and that was discussed with him today of also discussed this but should try the clinic nurse manager as well as Dr.  Leanord Hawking. 04/26/2020 upon evaluation today patient appears to be doing better with regard to his wounds currently. He is making some progress and overall swelling is under good control with the compression wraps. Fortunately there is no evidence of active infection at this time. 05/10/2020 on evaluation today patient appears to be doing overall well in regard to his lower extremities bilaterally. He is Tolerating the compression wraps without complication and with what we are seeing currently I feel like that he is making excellent progress. There is no signs of active infection at this time. 05/24/2020 upon evaluation today patient appears to be doing well in regard to his legs. The swelling is actually quite a bit down compared to where it has been in the past. Fortunately there is no sign of active infection at this time which is also good news. With that being said he does have several wounds on his toes that have opened up at this point. 05/31/2020 upon evaluation today patient appears to be doing well with  regard to his legs bilaterally where he really has no significant fluid buildup at this point overall he seems to be doing quite well. Very pleased in this regard. With regard to his toes these also seem to be drying up which is excellent. We have continue to wrap him as every time we tried as a transition to the juxta light wraps things just do not seem to get any better. 06/07/2020 upon evaluation today patient appears to be doing well with regard to his right leg at this point. Unfortunately left leg has a lot of blistering he tells me the wrap started to slide down on him when he tried to put his other Velcro wrap over top of it to help keep things in order but nonetheless still had some issues. 06/14/2020 on evaluation today patient appears to be doing well with regard to his lower extremity ulcers and foot ulcers at this point. I feel like everything is actually showing signs of improvement which  is great news overall there is no signs of active infection at this time. No fevers, chills, nausea, vomiting, or diarrhea. 06/21/2020 on evaluation today patient actually appears to be doing okay in regard to his wounds in general. With that being said the biggest issue I see is on his right foot in particular the first and second toe seem to be doing a little worse due to the fact this is staying very wet. I think he is probably getting need to change out his dressings a couple times in between each week when we see him in regard to his toes in order to keep this drier based on the location and how this is proceeding. 06/28/2020 on evaluation today patient appears to be doing a little bit more poorly overall in regard to the appearance of the skin I am actually somewhat concerned about the possibility of him having a little bit of an infection here. We discussed the course of potentially giving him a doxycycline prescription which he is taken previously with good result. With that being said I do believe that this is potentially mild and at this point easily fixed. I just do not want anything to get any worse. 07/12/2020 upon evaluation today patient actually appears to be making some progress with regard to his legs which is great news there does not appear to be any evidence of active infection. Overall very pleased with where things stand. 07/26/2020 upon evaluation today patient appears to be doing well with regard to his leg ulcers and toe ulcers at this point. He has been tolerating the compression wraps without complication overall very pleased in this regard. 08/09/2020 upon evaluation today patient appears to be doing well with regard to his lower extremities bilaterally. Fortunately there is no signs of active infection overall I am pleased with where things stand. 08/23/2020 on evaluation today patient appears to be doing well with regard to his wound. He has been tolerating the dressing  changes without complication. Fortunately there is no signs of active infection at this time. Overall his legs seem to be doing quite well which is great news and I am very pleased in that regard. No fevers, chills, nausea, vomiting, or diarrhea. 09/13/2020 upon evaluation today patient appears to be doing okay in regard to his lower extremities. He does have a fairly large blister on the right leg which I did remove the blister tissue from today so we can get this to dry out other than that however he seems  to be doing quite well. There is no signs of active infection at this time. 09/27/2020 upon evaluation today patient appears to actually be doing some better in regard to his right leg. Fortunately signs of active infection at this time which is great news. No fevers, chills, nausea, vomiting, or diarrhea. 10/04/2020 upon evaluation today patient actually appears to be showing signs of improvement which is great news with regard to his leg ulcers. Fortunately there is no signs of active infection which is great news he is still taking the antibiotics currently. No fevers, chills, nausea, vomiting, or diarrhea. Electronic Signature(s) Signed: 10/04/2020 1:21:35 PM By: Lenda Kelp PA-C Entered By: Lenda Kelp on 10/04/2020 13:21:34 -------------------------------------------------------------------------------- Physical Exam Details Patient Name: Date of Service: [name], [name] 10/04/2020 10:30 A M Medical Record Number: [id number] Patient Account Number: [id number] Date of Birth/Sex: Treating [name]: 07-18-1951 (69 y.o. [name] Primary Care Provider: Nicoletta Ba Other Clinician: Referring Provider: Treating Provider/Extender: Adele Dan in Treatment: 245 Constitutional Well-nourished and well-hydrated in no acute distress. Respiratory normal breathing without difficulty. Psychiatric this patient is able to make decisions and demonstrates  good insight into disease process. Alert and Oriented x 3. pleasant and cooperative. Notes Patient's wound bed actually showed signs of good granulation currently fortunately there does not appear to be any evidence of active infection which is great news and overall I am very pleased with where things stand. No fevers, chills, nausea, vomiting, or diarrhea. Electronic Signature(s) Signed: 10/04/2020 1:21:52 PM By: Lenda Kelp PA-C Entered By: Lenda Kelp on 10/04/2020 13:21:51 -------------------------------------------------------------------------------- Physician Orders Details Patient Name: Date of Service: [name], [name] 10/04/2020 10:30 A M Medical Record Number: [id number] Patient Account Number: [id number] Date of Birth/Sex: Treating [name]: [birth year] (68 y.o. 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 Dressing Change Frequency Do not change entire dressing for one week. Skin Barriers/Peri-Wound Care Moisturizing lotion - both legs and feet and toes Other: - patient to get urea cream 40% to be used on feet and toes Wound Cleansing May shower with protection. Primary Wound Dressing Wound #185 Right,Lateral Lower Leg Calcium Alginate with  Silver Secondary Dressing Wound #185 Right,Lateral Lower Leg Dry Gauze ABD pad - as needed Edema Control 4 layer compression: Left lower extremity Unna Boot to Right Lower Extremity Avoid standing for long periods of time Elevate legs to the level of the heart or above for 30 minutes daily and/or when sitting, a frequency of: - throughout the day Exercise regularly Electronic Signature(s) Signed: 10/04/2020 4:34:22 PM By: Lenda Kelp PA-C Signed: 10/05/2020 5:27:06 PM By: Kevin Mu [name] Entered By: [name] on 10/04/2020 13:01:17 -------------------------------------------------------------------------------- Problem List Details Patient Name: Date of Service: [name], [name] 10/04/2020 10:30 A M Medical Record Number: [id number] Patient Account Number: [id number] Date of Birth/Sex: Treating [name]: 11-04-1951 (69 y.o. [name] Primary Care Provider: Milinda Cave,  Loistine Chance Other Clinician: Referring Provider: Treating Provider/Extender: Adele Dan in Treatment: [phone] Active Problems ICD-10 Encounter Code Description Active Date MDM Diagnosis 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 skin 06/30/2018 No Yes L97.221 Non-pressure chronic ulcer of left calf limited to breakdown of skin 09/30/2016 No Yes 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 of 01/22/2016 No Yes bilateral lower extremity I89.0 Lymphedema, not elsewhere classified 01/22/2016 No Yes E11.622 Type 2 diabetes mellitus with other skin ulcer 01/22/2016 No Yes 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, unspecified 01/22/2016 No Yes L03.116 Cellulitis of left lower limb 04/01/2017 No Yes Inactive Problems ICD-10 Code Description Active Date Inactive Date 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 skin 06/30/2017 06/30/2017 L97.521 Non-pressure chronic ulcer of other part of  left foot limited to breakdown of skin 04/27/2018 04/27/2018 L03.115 Cellulitis of right lower limb 12/22/2017 12/22/2017 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 06/30/2018 06/30/2018 L97.511 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 of skin 06/30/2018 06/30/2018 Resolved Problems Electronic Signature(s) Signed: 10/04/2020 10:26:32 AM By: Lenda Kelp PA-C Entered By: Lenda Kelp on 10/04/2020 10:26:30 -------------------------------------------------------------------------------- Progress Note Details Patient Name: Date of Service: [name], [name] 10/04/2020 10:30 A M Medical Record Number: [id number] Patient Account Number: [id number] Date of Birth/Sex: Treating [name]: [birth year] (69 y.o. [name] Primary Care Provider: Nicoletta Ba Other Clinician: Referring Provider: Treating Provider/Extender: Adele Dan in Treatment: [phone] Subjective Chief Complaint Information obtained from Patient patient is here for evaluation venous/lymphedema weeping History of Present Illness (HPI) Referred by PCP for consultation. Patient has long standing history of BLE venous stasis, no prior ulcerations. At beginning of month, developed cellulitis and weeping. Received IM Rocephin followed by Keflex and resolved. Wears compression stocking, appr 6 months old. Not sure strength. No present drainage. 01/22/16 this is a patient who is a type II diabetic on insulin. 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 He tells me he religiously wears pressure stockings of uncertain strength. He was here with weeping edema about 8 months ago but did not have an open wound. Roughly a month ago he had a reopening on his bilateral legs. He is been using bandages and Neosporin. He does not complain of pain. 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 Last BUN/creatinine I have is from 11/20/15 at 13 and 1.0 respectively 01/29/16; patient arrives today having tolerated the Profore wrap. He brought in his stockings and these are 18 mmHg stockings he bought from [employer]. The compression here is likely inadequate. He does not complain of pain or excessive drainage she has no systemic symptoms. The wound on the right looks improved as does the one on the left although one on the left is more substantial with still tissue at risk below the actual wound area on the bilateral posterior calf 02/05/16; patient arrives with poor edema control. He states that we did put a 4 layer compression on it last week. No weight appear 5 this. 02/12/16; the area on the posterior right Has healed. The left Has a substantial wound that has necrotic surface eschar that requires a debridement with a curette. 02/16/16;the patient called or a Nurse visit secondary to increased swelling. He had been in earlier  in the week with his right leg healed. He was transitioned to is on pressure stocking on the right leg with the only open wound on the left, a substantial area on the left posterior calf. Note he has a history of severe lower extremity edema, he has a history of chronic atrial fibrillation but not heart failure per my notes but I'll need to research this. He is not complaining of chest pain shortness of breath or orthopnea. The intake nurse noted blisters on the previously closed right leg 02/19/16; this is the patient's regular visit day. I see him on Friday with escalating edema new wounds on the right leg and clear signs of at least right ventricular heart failure. I increased his Lasix to 40 twice a day. He is returning currently in follow-up. States he is noticed a decrease in that the edema 02/26/16 patient's legs have much less edema. There is nothing really open on the right leg. The left leg has  improved condition of the large superficial wound on the posterior left leg 03/04/16; edema control is very much better. The patient's right leg wounds have healed. On the left leg he continues to have severe venous inflammation on the posterior aspect of the left leg. There is no tenderness and I don't think any of this is cellulitis. 03/11/16; patient's right leg is married healed and he is in his own stocking. The patient's left leg has deteriorated somewhat. There is a lot of erythema around the wound on the posterior left leg. There is also a significant rim of erythema posteriorly just above where the wrap would've ended there is a new wound in this location and a lot of tenderness. Can't rule out cellulitis in this area. 03/15/16; patient's right leg remains healed and he is in his own stocking. The patient's left leg is much better than last review. His major wound on the posterior aspect of his left Is almost fully epithelialized. He has 3 small injuries from the wraps. Really. Erythema seems a lot better on antibiotics 03/18/16; right leg remains healed and he is in his own stocking. The patient's left leg is much better. The area on the posterior aspect of the left calf is fully epithelialized. His 3 small injuries which were wrap injuries on the left are improved only one seems still open his erythema has resolved 03/25/16; patient's right leg remains healed and he is in his own stocking. There is no open area today on the left leg posterior leg is completely closed up. His wrap injuries at the superior aspect of his leg are also resolved. He looks as though he has some irritation on the dorsal ankle but this is fully epithelialized without evidence of infection. 03/28/16; we discharged this patient on Monday. Transitioned him into his own stocking. There were problems almost immediately with uncontrolled swelling weeping edema multiple some of which have opened. He does not feel systemically  unwell in particular no chest pain no shortness of breath and he does not feel 04/08/16; the edema is under better control with the Profore light wrap but he still has pitting edema. There is one large wound anteriorly 2 on the medial aspect of his left leg and 3 small areas on the superior posterior calf. Drainage is not excessive he is tolerating a Profore light well 04/15/16; put a Profore wrap on him last week. This is controlled is edema however he had a lot of pain on his left anterior foot most of his wounds  are healed 04/22/16 once again the patient has denuded areas on the left anterior foot which he states are because his wrap slips up word. He saw his primary physician today is on Lasix 40 twice a day and states that he his weight is down 20 pounds over the last 3 months. 04/29/16: Much improved. left anterior foot much improved. He is now on Lasix 80 mg per day. Much improved edema control 05/06/16; I was hoping to be able to discharge him today however once again he has blisters at a low level of where the compression was placed last week mostly on his left lateral but also his left medial leg and a small area on the anterior part of the left foot. 05/09/16; apparently the patient went home after his appointment on 7/4 later in the evening developing pain in his upper medial thigh together with subjective fever and chills although his temperature was not taken. The pain was so intense he felt he would probably have to call 911. However he then remembered that he had leftover doxycycline from a previous round of antibiotics and took these. By the next morning he felt a lot better. He called and spoke to one of our nurses and I approved doxycycline over the phone thinking that this was in relation to the wounds we had previously seen although they were definitely were not. The patient feels a lot better old fever no chills he is still working. Blood sugars are reasonably controlled 05/13/16; patient  is back in for review of his cellulitis on his anterior medial upper thigh. He is taking doxycycline this is a lot better. Culture I did of the nodular area on the dorsal aspect of his foot grew MRSA this also looks a lot better. 05/20/16; the patient is cellulitis on the medial upper thigh has resolved. All of his wound areas including the left anterior foot, areas on the medial aspect of the left calf and the lateral aspect of the calf at all resolved. He has a new blister on the left dorsal foot at the level of the fourth toe this was excised. No evidence of infection 05/27/16; patient continues to complain weeping edema. He has new blisterlike wounds on the left anterior lateral and posterior lateral calf at the top of his wrap levels. The area on his left anterior foot appears better. He is not complaining of fever, pain or pruritus in his feet. 05/30/16; the patient's blisters on his left anterior leg posterior calf all look improved. He did not increase the Lasix 100 mg as I suggested because he was going to run out of his 40 mg tablets. He is still having weeping edema of his toes 06/03/16; I renewed his Lasix at 80 mg once a day as he was about to run out when I last saw him. He is on 80 mg of Lasix now. I have asked him to cut down on the excessive amount of water he was drinking and asked him to drink according to his thirst mechanisms 06/12/2016 -- was seen 2 days ago and was supposed to wear his compression stockings at home but he is developed lymphedema and superficial blisters on the left lower extremity and hence came in for a review 06/24/16; the remaining wound is on his left anterior leg. He still has edema coming from between his toes. There is lymphedema here however his edema is generally better than when I last saw this. He has a history of atrial fibrillation but  does not have a known history of congestive heart failure nevertheless I think he probably has this at least on a  diastolic basis. 07/01/16 I reviewed his echocardiogram from January 2017. This was essentially normal. He did not have LVH, EF of 55-60%. His right ventricular function was normal although he did have trivial tricuspid and pulmonic regurgitation. This is not audible on exam however. I increased his Lasix to do massive edema in his legs well above his knees I think in early July. He was also drinking an excessive amount of water at the time. 07/15/16; missed his appointment last week because of the Labor Day holiday on Monday. He could not get another appointment later in the week. Started to feel the wrap digging in superiorly so we remove the top half and the bottom half of his wrap. He has extensive erythema and blistering superiorly in the left leg. Very tender. Very swollen. Edema in his foot with leaking edema fluid. He has not been systemically unwell 07/22/16; the area on the left leg laterally required some debridement. The medial wounds look more stable. His wrap injury wounds appear to have healed. Edema and his foot is better, weeping edema is also better. He tells me he is meeting with the supplier of the external compression pumps at work 08/05/16; the patient was on vacation last week in [facility]. His wrap is been on for an extended period of time. Also over the weekend he developed an extensive area of tender erythema across his anterior medial thigh. He took to doxycycline yesterday that he had leftover from a previous prescription. The patient complains of weeping edema coming out of his toes 08/08/16; I saw this patient on 10/2. He was tender across his anterior thigh. I put him on doxycycline. He returns today in follow-up. He does not have any open wounds on his lower leg, he still has edema weeping into his toes. 08/12/16; patient was seen back urgently today to follow-up for his extensive left thigh cellulitis/erysipelas. He comes back with a lot less swelling and erythema pain is  much better. I believe I gave him Augmentin and Cipro. His wrap was cut down as he stated a roll down his legs. He developed blistering above the level of the wrap that remained. He has 2 open blisters and 1 intact. 08/19/16; patient is been doing his primary doctor who is increased his Lasix from 40-80 once a day or 80 already has less edema. Cellulitis has remained improved in the left thigh. 2 open areas on the posterior left calf 08/26/16; he returns today having new open blisters on the anterior part of his left leg. He has his compression pumps but is not yet been shown how to use some vital representative from the supplier. 09/02/16 patient returns today with no open wounds on the left leg. Some maceration in his plantar toes 09/10/2016 -- Dr. Leanord Hawking had recently discharged him on 09/02/2016 and he has come right back with redness swelling and some open ulcers on his left lower extremity. He says this was caused by trying to apply his compression stockings and he's been unable to use this and has not been able to use his lymphedema pumps. He had some doxycycline leftover and he has started on this a few days ago. 09/16/16; there are no open wounds on his leg on the left and no evidence of cellulitis. 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 He  does not complain of symptoms here. I am not clear use using his external compression pumps. 09/23/16; I have not seen this patient in 2 weeks. He canceled his appointment 10 days ago as he was going on vacation. He tells me that on Monday he noticed a large area on his posterior left leg which is been draining copiously and is reopened into a large wound. He is been using ABDs and the external part of his juxtalite, according to our nurse this was not on properly. 10/07/16; Still a substantial area on the posterior left leg. Using silver alginate 10/14/16; in general better although there is  still open area which looks healthy. Still using silver alginate. He reminds me that this happen before he left for [facility]. T oday while he was showering in the morning. He had been using his juxtalite's 10/21/16; the area on his posterior left leg is fully epithelialized. However he arrives today with a large area of tender erythema in his medial and posterior left thigh just above the knee. I have marked the area. Once again he is reluctant to consider hospitalization. I treated him with oral antibiotics in the past for a similar situation with resolution I think with doxycycline however this area it seems more extensive to me. He is not complaining of fever but does have chills and says states he is thirsty. His blood sugar today was in the 140s at home 10/25/16 the area on his posterior left leg is fully epithelialized although there is still some weeping edema. The large area of tenderness and erythema in his medial and posterior left thigh is a lot less tender although there is still a lot of swelling in this thigh. He states he feels a lot better. He is on doxycycline and Augmentin that I started last week. This will continued until Tuesday, December 26. I have ordered a duplex ultrasound of the left thigh rule out DVT whether there is an abscess something that would need to be drained I would also like to know. 11/01/16; he still has weeping edema from a not fully epithelialized area on his left posterior calf. Most of the rest of this looks a lot better. He has completed his antibiotics. His thigh is a lot better. Duplex ultrasound did not show a DVT in the thigh 11/08/16; he comes in today with more Denuded surface epithelium from the posterior aspect of his calf. There is no real evidence of cellulitis. The superior aspect of his wrap appears to have put quite an indentation in his leg just below the knee and this may have contributed. He does not complain of pain or fever. We have been  using silver alginate as the primary dressing. The area of cellulitis in the right thigh has totally resolved. He has been using his compression stockings once a week 11/15/16; the patient arrives today with more loss of epithelium from the posterior aspect of his left calf. He now has a fairly substantial wound in this area. The reason behind this deterioration isn't exactly clear although his edema is not well controlled. He states he feels he is generally more swollen systemically. He is not complaining of chest pain shortness of breath fever. T me he has an appointment with his primary physician in early February. He is on 80 mg of oral ells Lasix a day. He claims compliance with the external compression pumps. He is not having any pain in his legs similar to what he has with his recurrent  cellulitis 11/22/16; the patient arrives a follow-up of his large area on his left lateral calf. This looks somewhat better today. He came in earlier in the week for a dressing change since I saw him a week ago. He is not complaining of any pain no shortness of breath no chest pain 11/28/16; the patient arrives for follow-up of his large area on the left lateral calf this does not look better. In fact it is larger weeping edema. The surface of the wound does not look too bad. We have been using silver alginate although I'm not certain that this is a dressing issue. 12/05/16; again the patient follows up for a large wound on the left lateral and left posterior calf this does not look better. There continues to be weeping edema necrotic surface tissue. More worrisome than this once again there is erythema below the wound involving the distal Achilles and heel suggestive of cellulitis. He is on his feet working most of the day of this is not going well. We are changing his dressing twice a week to facilitate the drainage. 12/12/16; not much change in the overall dimensions of the large area on the left posterior calf. This  is very inflamed looking. I gave him an. Doxycycline last week does not really seem to have helped. He found the wrap very painful indeed it seems to of dog into his legs superiorly and perhaps around the heel. He came in early today because the drainage had soaked through his dressings. 12/19/16- patient arrives for follow-up evaluation of his left lower extremity ulcers. He states that he is using his lymphedema pumps once daily when there is "no drainage". He admits to not using his lipedema pumps while under current treatment. His blood sugars have been consistently between 150-200. 12/26/16; the patient is not using his compression pumps at home because of the wetness on his feet. I've advised him that I think it's important for him to use this daily. He finds his feet too wet, he can put a plastic bag over his legs while he is in the pumps. Otherwise I think will be in a vicious circle. We are using silver alginate to the major area on his left posterior calf 01/02/17; the patient's posterior left leg has further of all into 3 open wounds. All of them covered with a necrotic surface. He claims to be using his compression pumps once a day. His edema control is marginal. Continue with silver alginate 01/10/17; the patient's left posterior leg actually looks somewhat better. There is less edema, less erythema. Still has 3 open areas covered with a necrotic surface requiring debridement. He claims to be using his compression pumps once a day his edema control is better 01/17/17; the patient's left posterior calf look better last week when I saw him and his wrap was changed 2 days ago. He has noted increasing pain in the left heel and arrives today with much larger wounds extensive erythema extending down into the entire heel area especially tender medially. He is not systemically unwell CBGs have been controlled no fever. Our intake nurse showed me limegreen drainage on his AVD pads. 01/24/17; his usual  this patient responds nicely to antibiotics last week giving him Levaquin for presumed Pseudomonas. The whole entire posterior part of his leg is much better much less inflamed and in the case of his Achilles heel area much less tender. He has also had some epithelialization posteriorly there are still open areas here and still draining  but overall considerably better 01/31/17- He has continue to tolerate the compression wraps. he states that he continues to use the lymphedema pumps daily, and can increase to twice daily on the weekends. He is voicing no complaints or concerns regarding his LLE ulcers 02/07/17-he is here for follow-up evaluation. He states that he noted some erythema to the left medial and anterior thigh, which he states is new as of yesterday. He is concerned about recurrent cellulitis. He states his blood sugars have been slightly elevated, this morning in the 180s 02/14/17; he is here for follow-up evaluation. When he was last here there was erythema superiorly from his posterior wound in his anterior thigh. He was prescribed Levaquin however a culture of the wound surface grew MRSA over the phone I changed him to doxycycline on Monday and things seem to be a lot better. 02/24/17; patient missed his appointment on Friday therefore we changed his nurse visit into a physician visit today. Still using silver alginate on the large area of the posterior left thigh. He isn't new area on the dorsal left second toe 03/03/17; actually better today although he admits he has not used his external compression pumps in the last 2 days or so because of work responsibilities over the weekend. 03/10/17; continued improvement. External compression pumps once a day almost all of his wounds have closed on the posterior left calf. Better edema control 03/17/17; in general improved. He still has 3 small open areas on the lateral aspect of his left leg however most of the area on the posterior part of his leg  is epithelialized. He has better edema control. He has an ABD pad under his stocking on the right anterior lower leg although he did not let us look at that today. 03/24/17; patient arrives back in clinic today with no open areas however there are areas on the posterior left calf and anterior left calf that are less than 100% epithelialized. His edema is well controlled in the left lower leg. There is some pitting edema probably lymphedema in the left upper thigh. He uses compression pumps at home once per day. I tried to get him to do this twice a day although he is very reticent. 04/01/2017 -- for the last 2 days he's had significant redness, tenderness and weeping and came in for an urgent visit today. 04/07/17; patient still has 6 more days of doxycycline. He was seen by Dr. Meyer Russel last Wednesday for cellulitis involving the posterior aspect, lateral aspect of his Involving his heel. For the most part he is better there is less erythema and less weeping. He has been on his feet for 12 hours o2 over the weekend. Using his compression pumps once a day 04/14/17 arrives today with continued improvement. Only one area on the posterior left calf that is not fully epithelialized. He has intense bilateral venous inflammation associated with his chronic venous insufficiency disease and secondary lymphedema. We have been using silver alginate to the left posterior calf wound In passing he tells [place] today that the right leg but we have not seen in quite some time has an open area on it but he doesn't want [place] to look at this today states he will show this to [place] next week. 04/21/17; there is no open area on his left leg although he still reports some weeping edema. He showed [place] his right leg today which is the first time we've seen this leg in a long time. He has a large area  of open wound on the right leg anteriorly healthy granulation. Quite a bit of swelling in the right leg and some degree of venous  inflammation. He told us about the right leg in passing last week but states that deterioration in the right leg really only happened over the weekend 04/28/17; there is no open area on the left leg although there is an irritated part on the posterior which is like a wrap injury. The wound on the right leg which was new from last week at least to [place] is a lot better. 05/05/17; still no open area on the left leg. Patient is using his new compression stocking which seems to be doing a good job of controlling the edema. He states he is using his compression pumps once per day. The right leg still has an open wound although it is better in terms of surface area. Required debridement. A lot of pain in the posterior right Achilles marked tenderness. Usually this type of presentation this patient gives concern for an active cellulitis 05/12/17; patient arrives today with his major wound from last week on the right lateral leg somewhat better. Still requiring debridement. He was using his compression stocking on the left leg however that is reopened with superficial wounds anteriorly he did not have an open wound on this leg previously. He is still using his juxta light's once daily at night. He cannot find the time to do this in the morning as he has to be at work by 7 AM 05/19/17; right lateral leg wound looks improved. No debridement required. The concerning area is on the left posterior leg which appears to almost have a subcutaneous hemorrhagic component to it. We've been using silver alginate to all the wounds 05/26/17; the right lateral leg wound continues to look improved. However the area on the left posterior calf is a tightly adherent surface. [place] using silver alginate. Because of the weeping edema in his legs there is very little good alternatives. 06/02/17; the patient left here last week looking quite good. Major wound on the left posterior calf and a small one on the right lateral calf. Both of these  look satisfactory. He tells me that by Wednesday he had noted increased pain in the left leg and drainage. He called on Thursday and Friday to get an appointment here but we were blocked. He did not go to urgent care or his primary physician. He thinks he had a fever on Thursday but did not actually take his temperature. He has not been using his compression pumps on the left leg because of pain. I advised him to go to the emergency room today for IV antibiotics for stents of left leg cellulitis but he has refused I have asked him to take 2 days off work to keep his leg elevated and he has refused this as well. In view of this I'm going to call him and Augmentin and doxycycline. 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 However after a detailed discussion regarding the risk benefits and alternatives he refuses to get admitted to the hospital. With no other recourse I will continue him on oral antibiotics as before and hopefully he'll have his infectious disease consultation this week. 06/16/2017 -- the patient was  seen today by the nurse practitioner at infectious disease Ms. Dixon. 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 He has also been advise Lotrimin cream locally. she also had wise IV antibiotics for his condition if it flares up 06/23/17; patient arrives today with drainage bilaterally although the remaining wound on the left posterior calf after cleaning up today "highlighter yellow drainage" did not look too bad. Unfortunately he has had breakdown on the right anterior leg  [previously this leg had not been open and he is using a black stocking] he went to see infectious disease and is been put on clindamycin 150 mg daily, I did not verify the dose although I'm not familiar with using clindamycin in this dosing range, perhaps for prophylaxisoo 06/27/17; I brought this patient back today to follow-up on the wound deterioration on the right lower leg together with surrounding cellulitis. I started him on doxycycline 4 days ago. This area looks better however he comes in today with intense cellulitis on the medial part of his left thigh. This is not have a wound in this area. Extremely tender. We've been using silver alginate to the wounds on the right lower leg left lower leg with bilateral 4 layer compression he is using his external compression pumps once a day 07/04/17; patient's left medial thigh cellulitis looks better. He has not been using his compression pumps as his insert said it was contraindicated with cellulitis. His right leg continues to make improvements all the wounds are still open. We only have one remaining wound on the left posterior calf. Using silver alginate to all open areas. He is on doxycycline which I started a week ago and should be finishing I gave him Augmentin after Thursday's visit for the severe cellulitis on the left medial thigh which fortunately looks better 07/14/17; the patient's left medial thigh cellulitis has resolved. The cellulitis in his right lower calf on the right also looks better. All of his wounds are stable to improved we've been using silver alginate he has completed the antibiotics I have given him. He has clindamycin 150 mg once a day prescribed by infectious disease for prophylaxis, I've advised him to start this now. We have been using bilateral Unna boots over silver alginate to the wound areas 07/21/17; the patient is been to see infectious disease who noted his recurrent problems with cellulitis. He was not able to  tolerate prophylactic clindamycin therefore he is on amoxicillin 500 twice a day. He also had a second daily dose of Lasix added By Dr. Oneta Rack but he is not taking this. Nor is he being completely compliant with his compression pumps a especially not this week. He has 2 remaining wounds one on the right posterior lateral lower leg and one on the left posterior medial lower leg. 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 The patient has Unna boots bilaterally. 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 He has bilateral Unna boots for compression and silver alginate to his wounds. Arrives today with his legs looking as good as I have seen him in quite some time. Not surprisingly  his wounds look better as well with improvement on the right lateral leg venous insufficiency wound and also the left medial leg. He is still using the compression pumps once a day 08/11/17; both legs appear to be doing better wounds on the right lateral and left medial legs look better. Skin on the right leg quite good. He is been using silver alginate as the primary dressing. I'm going to use Anasept gel calcium alginate and maintain all the secondary dressings 08/18/17; the patient continues to actually do quite well. The area on his right lateral leg is just about closed the left medial also looks better although it is still moist in this area. His edema is well controlled we have been using Anasept gel with calcium alginate and the usual secondary dressings, 4 layer compression and once daily use of his compression pumps "always been able to manage 09/01/17; the patient continues to do reasonably well in spite of his trip to [place]. The area on the right lateral leg is epithelialized. Left is much  better but still open. He has more edema and more chronic erythema on the left leg [venous inflammation] 09/08/17; he arrives today with no open wound on the right lateral leg and decently controlled edema. Unfortunately his left leg is not nearly as in his good situation as last week.he apparently had increasing edema starting on Saturday. He edema soaked through into his foot so used a plastic bag to walk around his home. The area on the medial right leg which was his open area is about the same however he has lost surface epithelium on the left lateral which is new and he has significant pain in the Achilles area of the left foot. He is already on amoxicillin chronically for prophylaxis of cellulitis in the left leg 09/15/17; he is completed a week of doxycycline and the cellulitis in the left posterior leg and Achilles area is as usual improved. He still has a lot of edema and fluid soaking through his dressings. There is no open wound on the right leg. He saw infectious disease NP today 09/22/17;As usual 1 we transition him from our compression wraps to his stockings things did not go well. He has several small open areas on the right leg. He states this was caused by the compression wrap on his skin although he did not wear this with the stockings over them. He has several superficial areas on the left leg medially laterally posteriorly. He does not have any evidence of active cellulitis especially involving the left Achilles The patient is traveling from [facility] Saturday going to [facility]. He states he isn't attempting to get an appointment with a heel objects wound center there to change his dressings. I am not completely certain whether this will work 10/06/17; the patient came in on Friday for a nurse visit and the nurse reported that his legs actually look quite good. He arrives in clinic today for his regular follow-up visit. He has a new wound on his left third toe over the PIP  probably caused by friction with his footwear. He has small areas on the left leg and a very superficial but epithelialized area on the right anterior lateral lower leg. Other than that his legs look as good as I've seen him in quite some time. We have been using silver alginate Review of systems; no chest pain no shortness of breath other than this a 10 point review of systems negative 10/20/17; seen by Dr. Meyer Russel  last week. He had taken some antibiotics [doxycycline] that he had left over. Dr. Meyer Russel thought he had candida infection and declined to give him further antibiotics. He has a small wound remaining on the right lateral leg several areas on the left leg including a larger area on the left posterior several left medial and anterior and a small wound on the left lateral. The area on the left dorsal third toe looks a lot better. ROS; Gen.; no fever, respiratory no cough no sputum Cardiac no chest pain other than this 10 point review of system is negative 10/30/17; patient arrives today having fallen in the bathtub 3 days ago. It took him a while to get up. He has pain and maceration in the wounds on his left leg which have deteriorated. He has not been using his pumps he also has some maceration on the right lateral leg. 11/03/17; patient continues to have weeping edema especially in the left leg. This saturates his dressings which were just put on on 12/27. As usual the doxycycline seems to take care of the cellulitis on his lower leg. He is not complaining of fever, chills, or other systemic symptoms. He states his leg feels a lot better on the doxycycline I gave him empirically. He also apparently gets injections at his primary doctor's officeo Rocephin for cellulitis prophylaxis. I didn't ask him about his compression pump compliance today I think that's probably marginal. Arrives in the clinic with all of his dressings primary and secondary macerated full of fluid and he has bilateral  edema 11/10/17; the patient's right leg looks some better although there is still a cluster of wounds on the right lateral. The left leg is inflamed with almost circumferential skin loss medially to laterally although we are still maintaining anteriorly. He does not have overt cellulitis there is a lot of drainage. He is not using compression pumps. We have been using silver alginate to the wound areas, there are not a lot of options here 11/17/17; the patient's right leg continues to be stable although there is still open wounds, better than last week. The inflammation in the left leg is better. Still loss of surface layer epithelium especially posteriorly. There is no overt cellulitis in the amount of edema and his left leg is really quite good, tells me he is using his compression pumps once a day. 11/24/17; patient's right leg has a small superficial wound laterally this continues to improve. The inflammation in the left leg is still improving however we have continuous surface layer epithelial loss posteriorly. There is no overt cellulitis in the amount of edema in both legs is really quite good. He states he is using his compression pumps on the left leg once a day for 5 out of 7 days 12/01/17; very small superficial areas on the right lateral leg continue to improve. Edema control in both legs is better today. He has continued loss of surface epithelialization and left posterior calf although I think this is better. We have been using silver alginate with large number of absorptive secondary dressings 4 layer on the left Unna boot on the right at his request. He tells me he is using his compression pumps once a day 12/08/17; he has no open area on the right leg is edema control is good here. ooOn the left leg however he has marked erythema and tenderness breakdown of skin. He has what appears to be a wrap injury just distal to the popliteal fossa. This is the  pattern of his recurrent cellulitis area  and he apparently received penicillin at his primary physician's office really worked in my view but usually response to doxycycline given it to him several times in the past 12/15/17; the patient had already deteriorated last Friday when he came in for his nurse check. There was swelling erythema and breakdown in the right leg. He has much worse skin breakdown in the left leg as well multiple open areas medially and posteriorly as well as laterally. He tells me he has been using his compression pumps but tells me he feels that the drainage out of his leg is worse when he uses a compression pumps. T be fair to him he is been saying this o for a while however I don't know that I have really been listening to this. I wonder if the compression pumps are working properly 12/22/17;. Once again he arrives with severe erythema, weeping edema from the left greater than right leg. Noncompliance with compression pumps. New this visit he is complaining of pain on the lateral aspect of the right leg and the medial aspect of his right thigh. He apparently saw his cardiologist Dr. [name] who was ordered an echocardiogram area and I think this is a step in the right direction 12/25/17; started his doxycycline Monday night. There is still intense erythema of the right leg especially in the anterior thigh although there is less tenderness. The erythema around the wound on the right lateral calf also is less tender. He still complaining of pain in the left heel. His wounds are about the same right lateral left medial left lateral. Superficial but certainly not close to closure. He denies being systemically unwell no fever chills no abdominal pain no diarrhea 12/29/17; back in follow-up of his extensive right calf and right thigh cellulitis. I added amoxicillin to cover possible doxycycline resistant strep. This seems to of done the trick he is in much less pain there is much less erythema and swelling. He has his  echocardiogram at 11:00 this morning. X-ray of the left heel was also negative. 01/05/18; the patient arrived with his edema under much better control. Now that he is retired he is able to use his compression pumps daily and sometimes twice a day per the patient. He has a wound on the right leg the lateral wound looks better. Area on the left leg also looks a lot better. He has no evidence of cellulitis in his bilateral thighs I had a quick peak at his echocardiogram. He is in normal ejection fraction and normal left ventricular function. He has moderate pulmonary hypertension moderately reduced right ventricular function. One would have to wonder about chronic sleep apnea although he says he doesn't snore. He'll review the echocardiogram with his cardiologist. 01/12/18; the patient arrives with the edema in both legs under exemplary control. He is using his compression pumps daily and sometimes twice daily. His wound on the right lateral leg is just about closed. He still has some weeping areas on the posterior left calf and lateral left calf although everything is just about closed here as well. I have spoken with Aldean Baker who is the patient's nurse practitioner and infectious disease. She was concerned that the patient had not understood that the parenteral penicillin injections he was receiving for cellulitis prophylaxis was actually benefiting him. I don't think the patient actually saw that I would tend to agree we were certainly dealing with less infections although he had a serious one last month.  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 He is healed. He'll be placed in juxtalite compression wraps and increase his lymphedema pumps to twice daily. We will follow up again next week to ensure there are no issues with the new regiment. 01/20/18-he is here for evaluation of bilateral lower extremity weeping edema. Yesterday he was placed in compression wrap  to the right lower extremity and compression stocking to left lower shrubbery. He states he uses lymphedema pumps last night and again this morning and noted a blister to the left lower extremity. On exam he was noted to have drainage to the right lower extremity. He will be placed in Unna boots bilaterally and follow-up next week 01/26/18; patient was actually discharged a week ago to his own juxta light stockings only to return the next day with bilateral lower extremity weeping edema.he was placed in bilateral Unna boots. He arrives today with pain in the back of his left leg. There is no open area on the right leg however there is a linear/wrap injury on the left leg and weeping edema on the left leg posteriorly. I spoke with infectious disease about 10 days ago. They were disappointed that the patient elected to discontinue prophylactic intramuscular penicillin shots as they felt it was particularly beneficial in reducing the frequency of his cellulitis. I discussed this with the patient today. He does not share this view. He'll definitely need antibiotics today. Finally he is traveling to [place] and trauma leaving this Saturday and returning a week later and he does not travel with his pumps. He is going by car 01/30/18; patient was seen 4 days ago and brought back in today for review of cellulitis in the left leg posteriorly. I put him on amoxicillin this really hasn't helped as much as I might like. He is also worried because he is traveling to [facility] trauma by car. Finally we will be rewrapping him. There is no open area on the right leg over his left leg has multiple weeping areas as usual 02/09/18; The same wrap on for 10 days. He did not pick up the last doxycycline I prescribed for him. He apparently took 4 days worth he already had. There is nothing open on his right leg and the edema control is really quite good. He's had damage in the left leg medially and laterally especially probably  related to the prolonged use of Unna boots 02/12/18; the patient arrived in clinic today for a nurse visit/wrap change. He complained of a lot of pain in the left posterior calf. He is taking doxycycline that I previously prescribed for him. Unfortunately even though he used his stockings and apparently used to compression pumps twice a day he has weeping edema coming out of the lateral part of his right leg. This is coming from the lower anterior lateral skin area. 02/16/18; the patient has finished his doxycycline and will finish the amoxicillin 2 days. The area of cellulitis in the left calf posteriorly has resolved. He is no longer having any pain. He tells me he is using his compression pumps at least once a day sometimes twice. 02/23/18; the patient finished his doxycycline and Amoxil last week. On Friday he noticed a small erythematous circle about the size of a quarter on the left lower leg just above his ankle. This rapidly expanded and he now has erythema on the lateral and posterior part of the thigh. This is bright red. Also has an area on the  dorsal foot just above his toes and a tender area just below the left popliteal fossa. He came off his prophylactic penicillin injections at his own insistence one or 2 months ago. This is obviously deteriorated since then 03/02/18; patient is on doxycycline and Amoxil. Culture I did last week of the weeping area on the back of his left calf grew group B strep. I have therefore renewed the amoxicillin 500 3 times a day for a further week. He has not been systemically unwell. Still complaining of an area of discomfort right under his left popliteal fossa. There is no open wound on the right leg. He tells me that he is using his pumps twice a day on most days 03/09/18; patient arrives in clinic today completing his amoxicillin today. The cellulitis on his left leg is better. Furthermore he tells me that he had intramuscular penicillin shots that his primary  care office today. However he also states that the wrap on his right leg fell down shortly after leaving clinic last week. He developed a large blister that was present when he came in for a nurse visit later in the week and then he developed intense discomfort around this area.He tells me he is using his compression pumps 03/16/18; the patient has completed his doxycycline. The infectious part of this/cellulitis in the left heel area left popliteal area is a lot better. He has 2 open areas on the right calf. Still areas on the left calf but this is a lot better as well. 03/24/18; the patient arrives complaining of pain in the left popliteal area again. He thinks some of this is wrap injury. He has no open area on the right leg and really no open area on the left calf either except for the popliteal area. He claims to be compliant with the compression pumps 03/31/18; I gave him doxycycline last week because of cellulitis in the left popliteal area. This is a lot better although the surface epithelium is denuded off and response to this. He arrives today with uncontrolled edema in the right calf area as well as a fingernail injury in the right lateral calf. There is only a few open areas on the left 04/06/18; I gave him amoxicillin doxycycline over the last 2 weeks that the amoxicillin should be completing currently. He is not complaining of any pain or systemic symptoms. The only open areas see has is on the right lateral lower leg paradoxically I cannot see anything on the left lower leg. He tells me he is using his compression pumps twice a day on most days. Silver alginate to the wounds that are open under 4 layer compression 04/13/18; he completed antibiotics and has no new complaints. Using his compression pumps. Silver alginate that anything that's opened 04/20/18; he is using his compression pumps religiously. Silver alginate 4 layer compression anything that's opened. He comes in today with no open  wounds on the left leg but 3 on the right including a new one posteriorly. He has 2 on the right lateral and one on the right posterior. He likes Unna boots on the right leg for reasons that aren't really clear we had the usual 4 layer compression on the left. It may be necessary to move to the 4 layer compression on the right however for now I left them in the Unna boots 04/27/18; he is using his compression pumps at least once a day. He has still the wounds on the right lateral calf. The area right  posteriorly has closed. He does not have an open wound on the left under 4 layer compression however on the dorsal left foot just proximal to the toes and the left third toe 2 small open areas were identified 05/11/18; he has not uses compression pumps. The areas on the right lateral calf have coalesced into one large wound necrotic surface. On the left side he has one small wound anteriorly however the edema is now weeping out of a large part of his left leg. He says he wasn't using his pumps because of the weeping fluid. I explained to him that this is the time he needs to pump more 05/18/18; patient states he is using his compression pumps twice a day. The area on the right lateral large wound albeit superficial. On the left side he has innumerable number of small new wounds on the left calf particularly laterally but several anteriorly and medially. All these appear to have healthy granulated base these look like the remnants of blisters however they occurred under compression. The patient arrives in clinic today with his legs somewhat better. There is certainly less edema, less multiple open areas on the left calf and the right anterior leg looks somewhat better as well superficial and a little smaller. However he relates pain and erythema over the last 3-4 days in the thigh and I looked at this today. He has not been systemically unwell no fever no chills no change in blood sugar values 05/25/18; comes  in today in a better state. The severe cellulitis on his left leg seems better with the Keflex. Not as tender. He has not been systemically unwell ooHard to find an open wound on the left lower leg using his compression pumps twice a day ooThe confluent wounds on his right lateral calf somewhat better looking. These will ultimately need debridement I didn't do this today. 06/01/18; the severe cellulitis on the left anterior thigh has resolved and he is completed his Keflex. ooThere is no open wound on the left leg however there is a superficial excoriation at the base of the third toe dorsally. Skin on the bottom of his left foot is macerated looking. ooThe left the wounds on the lateral right leg actually looks some better although he did require debridement of the top half of this wound area with an open curet 06/09/18 on evaluation today patient appears to be doing poorly in regard to his right lower extremity in particular this appears to likely be infected he has very thick purulent discharge along with a bright green tent to the discharge. This makes me concerned about the possibility of pseudomonas. He's also having increased discomfort at this point on evaluation. Fortunately there does not appear to be any evidence of infection spreading to the other location at this time. 06/16/18 on evaluation today patient appears to actually be doing fairly well. His ulcer has actually diminished in size quite significantly at this point which is good news. Nonetheless he still does have some evidence of infection he did see infectious disease this morning before coming here for his appointment. I did review the results of their evaluation and their note today. They did actually have him discontinue the Cipro and initiate treatment with linezolid at this time. He is doing this for the next seven days and they recommended a follow-up in four months with them. He is the keep a log of the need for intermittent  antibiotic therapy between now and when he falls back up with  infectious disease. This will help them gaze what exactly they need to do to try and help them out. 06/23/18; the patient arrives today with no open wounds on the left leg and left third toe healed. He is been using his compression pumps twice a day. On the right lateral leg he still has a sizable wound but this is a lot better than last time I saw this. 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 Has been using silver alginate under 4 layer compression 06/30/18; the only open wound he has is on the right lateral leg and this looks healthy. No debridement is required. We have been using silver alginate. He does not have an open wound on the left leg. There is apparently some drainage from the dorsal proximal third toe on the left although I see no open wound here. 07/03/18 on evaluation today patient was actually here just for a nurse visit rapid change. However when he was here on Wednesday for his rat change due to having been healed on the left and then developing blisters we initiated the wrap again knowing that he would be back today for [place] to reevaluate and see were at. Unfortunately he has developed some cellulitis into the proximal portion of his right lower extremity even into the region of his thigh. 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 He was placed on one as what at that point. Nonetheless he is done with that and has been tolerating it well otherwise. Doxycycline which in the past really did not seem to be effective for him. Nonetheless I think the best option may be for [place] to definitely reinitiate the antibiotics for a longer period of time. 07/07/18; since I last saw this patient a week ago he has had a difficult time. At that point he did not have an open wound on his left leg. We  transitioned him into juxta light stockings. He was apparently in the clinic the next day with blisters on the left lateral and left medial lower calf. He also had weeping edema fluid. He was put back into a compression wrap. He was also in the clinic on Friday with intense erythema in his right thigh. Per the patient he was started on Bactrim however that didn't work at all in terms of relieving his pain and swelling. He has taken 3 doxycycline that he had left over from last time and that seems to of helped. He has blistering on the right thigh as well. 07/14/18; the erythema on his right thigh has gotten better with doxycycline that he is finishing. 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 Presumably this cellulitis in the thigh was not related to that although I think this is a bit concerning going forward. He still has an area on the right lateral calf the blister on the right medial calf just below the knee that was discussed above. On the left 2 small open areas left medial and left lateral. Edema control is adequate. He is using his compression pumps twice a day 07/20/18; continued improvement in the condition of both legs especially the edema in his bilateral thighs. He tells me he is been losing weight through a combination of diet and exercise. He is using his compression pumps  twice a day. So overall she made to the remaining wounds 07/27/2018; continued improvement in condition of both legs. His edema is well controlled. The area on the right lateral leg is just about closed he had one blisters show up on the medial left upper calf. We have him in 4 layer compression. He is going on a 10-day trip to [place], [place] and [place]. He will be driving. He wants to wear Unna boots because of the lessening amount of constriction. He will not use compression pumps while he is away 08/05/18 on evaluation today patient  actually appears to be doing decently well all things considered in regard to his bilateral lower extremities. The worst ulcer is actually only posterior aspect of his left lower extremity with a four layer compression wrap cut into his leg a couple weeks back. He did have a trip and actually had [employer] for the trip that he is worn since he was last here. Nonetheless he feels like the [employer] actually do better for him his swelling is up a little bit but he also with his trip was not taking his Lasix on a regular set schedule like he was supposed to be. He states that obviously the reason being that he cannot drive and keep going without having to urinate too frequently which makes it difficult. He did not have his pumps with him while he was away either which I think also maybe playing a role here too. 08/13/2018; the patient only has a small open wound on the right lateral calf which is a big improvement in the last month or 2. He also has the area posteriorly just below the posterior fossa on the left which I think was a wrap injury from several weeks ago. He has no current evidence of cellulitis. He tells me he is back into his compression pumps twice a day. He also tells me that while he was at the laundromat somebody stole a section of his extremitease stockings 08/20/2018; back in the clinic with a much improved state. He only has small areas on the right lateral mid calf which is just about healed. This was is more substantial area for quite a prolonged period of time. He has a small open area on the left anterior tibia. The area on the posterior calf just below the popliteal fossa is closed today. He is using his compression pumps twice a day 08/28/2018; patient has no open wound on the right leg. He has a smattering of open areas on the calf with some weeping lymphedema. More problematically than that it looks as though his wraps of slipped down in his usual he has very angry  upper area of edema just below the right medial knee and on the right lateral calf. He has no open area on his feet. The patient is traveling to [facility] next week. I will send him in an antibiotic. We will continue to wrap the right leg. We ordered extremitease stockings for him last week and I plan to transition the right leg to a stocking when he gets home which will be in 10 days time. As usual he is very reluctant to take his pumps with him when he travels 09/07/2018; patient returns from [facility]. He shows me a picture of his left leg in the mid part of his trip last week with intense fire engine erythema. The picture look bad enough I would have considered sending him to the hospital.  Instead he went to the wound care center in [facility]. They did not prescribe him antibiotics but he did take some doxycycline he had leftover from a previous visit. I had given him trimethoprim sulfamethoxazole before he left this did not work according to the patient. This is resulted in some improvement fortunately. He comes back with a large wound on the left posterior calf. Smaller area on the left anterior tibia. Denuded blisters on the dorsal left foot over his toes. Does not have much in the way of wounds on the right leg although he does have a very tender area on the right posterior area just below the popliteal fossa also suggestive of infection. He promises me he is back on his pumps twice a day 09/15/2018; the intense cellulitis in his left lower calf is a lot better. The wound area on the posterior left calf is also so better. However he has reasonably extensive wounds on the dorsal aspect of his second and third toes and the proximal foot just at the base of the toes. There is nothing open on the right leg 09/22/2018; the patient has excellent edema control in his legs bilaterally. He is using his external compression pumps twice a day. He has no open area on the right leg and  only the areas in the left foot dorsally second and third toe area on the left side. He does not have any signs of active cellulitis. 10/06/2018; the patient has good edema control bilaterally. He has no open wound on the right leg. There is a blister in the posterior aspect of his left calf that we had to deal with today. He is using his compression pumps twice a day. There is no signs of active cellulitis. We have been using silver alginate to the wound areas. He still has vulnerable areas on the base of his left first second toes dorsally He has a his extremities stockings and we are going to transition him today into the stocking on the right leg. He is cautioned that he will need to continue to use the compression pumps twice a day. If he notices uncontrolled edema in the right leg he may need to go to 3 times a day. 10/13/2018; the patient came in for a nurse check on Friday he has a large flaccid blister on the right medial calf just below the knee. We unroofed this. He has this and a new area underneath the posterior mid calf which was undoubtedly a blister as well. He also has several small areas on the right which is the area we put his extremities stocking on. 10/19/2018; the patient went to see infectious disease this morning I am not sure if that was a routine follow-up in any case the doxycycline I had given him was discontinued and started on linezolid. He has not started this. It is easy to look at his left calf and the inflammation and think this is cellulitis however he is very tender in the tissue just below the popliteal fossa and I have no doubt that there is infection going on here. He states the problem he is having is that with the compression pumps the edema goes down and then starts walking the wrap falls down. We will see if we can adhere this. He has 1 or 2 minuscule open areas on the right still areas that are weeping on the posterior left calf, the base of his left second  and third toes 10/26/18; back today in  clinic with quite of skin breakdown in his left anterior leg. This may have been infection the area below the popliteal fossa seems a lot better however tremendous epithelial loss on the left anterior mid tibia area over quite inexpensive tissue. He has 2 blisters on the right side but no other open wound here. 10/29/2018; came in urgently to see [place] today and we worked him in for review. He states that the 4 layer compression on the right leg caused pain he had to cut it down to roughly his mid calf this caused swelling above the wrap and he has blisters and skin breakdown today. As a result of the pain he has not been using his pumps. Both legs are a lot more edematous and there is a lot of weeping fluid. 11/02/18; arrives in clinic with continued difficulties in the right leg> left. Leg is swollen and painful. multiple skin blisters and new open areas especially laterally. He has not been using his pumps on the right leg. 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 He is not systemically unwell. 11/09/2018; the patient claims he is being compliant with his pumps. He is finished the doxycycline I gave him last week. Culture I did of the wound on the right lateral leg showed a few very resistant methicillin staph aureus. This was resistant to doxycycline. Nevertheless he states the pain in the leg is a lot better which makes me wonder if the cultured organism was not really what was causing the problem nevertheless this is a very dangerous organism to be culturing out of any wound. His right leg is still a lot larger than the left. He is using an Radio broadcast assistant on this area, he blames a 4-layer compression for causing the original skin breakdown which I doubt is true however I cannot talk him out of it. We have been using silver alginate to all of these areas which were initially blisters 11/16/2018; patient is being  compliant with his external compression pumps at twice a day. Miraculously he arrives in clinic today with absolutely no open wounds. He has better edema control on the left where he has been using 4 layer compression versus wound of wounds on the right and I pointed this out to him. There is no inflammation in the skin in his lower legs which is also somewhat unusual for him. There is no open wounds on the dorsal left foot. He has extremitease stockings at home and I have asked him to bring these in next week. 11/25/18 patient's lower extremity on examination today on the left appears for the most part to be wound free. He does have an open wound on the lateral aspect of the right lower extremity but this is minimal compared to what I've seen in past. He does request that we go ahead and wrap the left leg as well even though there's nothing open just so hopefully it will not reopen in short order. 1/28; patient has superficial open wounds on the right lateral calf left anterior calf and left posterior calf. His edema control is adequate. 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 We have been using silver alginate as the primary dressing. He claims compliance with his compression pumps 2/4; patient has superficial open wounds on numerous areas of his left calf and again one on the left dorsal foot. The areas on the  right lateral calf have healed. The cellulitis that I gave him doxycycline for last week is also resolved this was mostly on the left anterior calf just below the tibial tuberosity. His edema looks fairly well-controlled. He tells me he went to see his primary doctor today and had blood work ordered 2/11; once again he has several open areas on the left calf left tibial area. Most of these are small and appear to have healthy granulation. He does not have anything open on the right. The edema and control in  his thighs is pretty good which is usually a good indication he has been using his pumps as requested. 2/18; he continues to have several small areas on the left calf and left tibial area. Most of these are small healthy granulation. We put him in his stocking on the right leg last week and he arrives with a superficial open area over the right upper tibia and a fairly large area on the right lateral tibia in similar condition. His edema control actually does not look too bad, he claims to be using his compression pumps twice a day 2/25. Continued small areas on the left calf and left tibial area. New areas especially on the right are identified just below the tibial tuberosity and on the right upper tibia itself. There are also areas of weeping edema fluid even without an obvious wound. He does not have a considerable degree of lymphedema but clearly there is more edema here than his skin can handle. He states he is using the pumps twice a day. We have an Unna boot on the right and 4 layer compression on the left. 3/3; he continues to have an area on the right lateral calf and right posterior calf just below the popliteal fossa. There is a fair amount of tenderness around the wound on the popliteal fossa but I did not see any evidence of cellulitis, could just be that the wrap came down and rubbed in this area. ooHe does not have an open area on the left leg however there is an area on the left dorsal foot at the base of the third toe ooWe have been using silver alginate to all wound areas 3/10; he did not have an open area on his left leg last time he was here a week ago. T oday he arrives with a horizontal wound just below the tibial tuberosity and an area on the left lateral calf. He has intense erythema and tenderness in this area. The area is on the right lateral calf and right posterior calf better than last week. We have been using silver alginate as usual 3/18 - Patient returns with 3 small  open areas on left calf, and 1 small open area on right calf, the skin looks ok with no significant erythema, he continues the UNA boot on right and 4 layer compression on left. The right lateral calf wound is closed , the right posterior is small area. we will continue silver alginate to the areas. 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 He is been tolerating the dressing changes without complication. Fortunately she has made excellent improvement in regard to the overall status of his wounds. Unfortunately every time we cease wrapping him he ends up reopening in causing more significant issues at that point. Again  I'm unsure of the best direction to take although I think the lymphedema clinic may be appropriate for him. 02/03/19 on evaluation today patient appears to be doing well in regard to the wounds that we saw him for last week unfortunately he has a new area on the proximal portion of his right medial/posterior lower extremity where the wrap somewhat slowed down and caused swelling and a blister to rub and open. Unfortunately this is the only opening that he has on either leg at this point. 02/17/19 on evaluation today patient's bilateral lower extremities appear to be doing well. He still completely healed in regard to the left lower extremity. In regard to the right lower extremity the area where the wrap and slid down and caused the blister still seems to be slightly open although this is dramatically better than during the last evaluation two weeks ago. I'm very pleased with the way this stands overall. 03/03/19 on evaluation today patient appears to be doing well in regard to his right lower extremity in general although he did have a new blister open this does not appear to be showing any  evidence of active infection at this time. Fortunately there's No fevers, chills, nausea, or vomiting noted at this time. Overall I feel like he is making good progress it does feel like that the right leg will we perform the [employer] seems to do with a bit better than three layer wrap on the left which slid down on him. We may switch to doing bilateral in the book wraps. 5/4; I have not seen Mr. Ribeiro in quite some time. According to our case manager he did not have an open wound on his left leg last week. He had 1 remaining wound on the right posterior medial calf. He arrives today with multiple openings on the left leg probably were blisters and/or wrap injuries from Unna boots. I do not think the Unna boot's will provide adequate compression on the left. I am also not clear about the frequency he is using the compression pumps. 03/17/19 on evaluation today patient appears to be doing excellent in regard to his lower extremities compared to last week's evaluation apparently. He had gotten significantly worse last week which is unfortunate. The [employer] wrap on the left did not seem to do very well for him at all and in fact it didn't control his swelling significantly enough he had an additional outbreak. Subsequently we go back to the four layer compression wrap on the left. This is good news. At least in that he is doing better and the wound seem to be killing him. He still has not heard anything from the lymphedema clinic. 03/24/19 on evaluation today patient actually appears to be doing much better in regard to his bilateral lower Trinity as compared to last week when I saw him. Fortunately there's no signs of active infection at this time. He has been tolerating the dressing changes without complication. Overall I'm extremely pleased with the progress and appearance in general. 04/07/19 on evaluation today patient appears to be doing well in regard to his bilateral lower extremities. His  swelling is significantly down from where it was previous. With that being said he does have a couple blisters still open at this point but fortunately nothing that seems to be too severe and again the majority of the larger openings has healed at this time. 04/14/19 on evaluation today patient actually appears to be doing quite well in regard to his  bilateral lower extremities in fact I'm not even sure there's anything significantly open at this time at any site. Nonetheless he did have some trouble with these wraps where they are somewhat irritating him secondary to the fact that he has noted that the graph wasn't too close down to the end of this foot in a little bit short as well up to his knee. Otherwise things seem to be doing quite well. 04/21/19 upon evaluation today patient's wound bed actually showed evidence of being completely healed in regard to both lower extremities which is excellent news. There does not appear to be any signs of active infection which is also good news. I'm very pleased in this regard. No fevers, chills, nausea, or vomiting noted at this time. 04/28/19 on evaluation today patient appears to be doing a little bit worse in regard to both lower extremities on the left mainly due to the fact that when he went infection disease the wrap was not wrapped quite high enough he developed a blister above this. On the right he is a small open area of nothing too significant but again this is continuing to give him some trouble he has been were in the Velcro compression that he has at home. 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 He's been tolerating the dressing changes without complication. Fortunately there's no signs of active infection at this time. No fevers, chills, nausea, or vomiting noted at this time. We have been trying to get an appointment with her lymphedema clinic in [facility] but  unfortunately nobody can get them on phone with not been able to even fax information over the patient likewise is not been able to get in touch with them. Overall I'm not sure exactly what's going on here with to reach out again today. 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 Still having a lot of drainage unfortunately. He tells me especially in the left but even on the right. There's no signs of active infection which is good news we've been using so ratcheted up to this point. 05/19/19 on evaluation today patient actually appears to be doing quite well with regard to his left lower extremity which is great news. Fortunately in regard to the right lower extremity has an issues with his wrap and he subsequently did remove this from what I'm understanding. Nonetheless long story short is what he had rewrapped once he removed it subsequently had maggots underneath this wrap whenever he came in for evaluation today. With that being said they were obviously completely cleaned away by the nursing staff. The visit today which is excellent news. However he does appear to potentially have some infection around the right ankle region where the maggots were located as well. He will likely require anabiotic therapy today. 05/26/19 on evaluation today patient actually appears to be doing much better in regard to his bilateral lower extremities. I feel like the infection is under much better control. With that being said there were maggots noted when the wrap was removed yet again today. Again this could have potentially been left over from previous although at this time there does not appear to be any signs of significant drainage there was obviously on the wrap some drainage as well this contracted gnats or otherwise. Either way I do not see anything that appears to be doing worse in  my pinion and in fact I think his drainage  has slowed down quite significantly likely mainly due to the fact to his infection being under better control. 06/02/2019 on evaluation today patient actually appears to be doing well with regard to his bilateral lower extremities there is no signs of active infection at this time which is great news. With that being said he does have several open areas more so on the right than the left but nonetheless these are all significantly better than previously noted. 06/09/2019 on evaluation today patient actually appears to be doing well. His wrap stayed up and he did not cause any problems he had more drainage on the right compared to the left but overall I do not see any major issues at this time which is great news. 06/16/2019 on evaluation today patient appears to be doing excellent with regard to his lower extremities the only area that is open is a new blister that can have opened as of today on the medial ankle on the left. Other than this he really seems to be doing great I see no major issues at this point. 06/23/2019 on evaluation today patient appears to be doing quite well with regard to his bilateral lower extremities. In fact he actually appears to be almost completely healed there is a small area of weeping noted of the right lower extremity just above the ankle. Nonetheless fortunately there is no signs of active infection at this time which is good news. No fevers, chills, nausea, vomiting, or diarrhea. 8/24; the patient arrived for a nurse visit today but complained of very significant pain in the left leg and therefore I was asked to look at this. Noted that he did not have an open area on the left leg last week nevertheless this was wrapped. The patient states that he is not been able to put his compression pumps on the left leg because of the discomfort. He has not been systemically unwell 06/30/2019 on evaluation today patient unfortunately despite being excellent last week is doing much  worse with regard to his left lower extremity today. In fact he had to come in for a nurse on Monday where his left leg had to be rewrapped due to excessive weeping Dr. Leanord Hawking placed him on doxycycline at that point. Fortunately there is no signs of active infection Systemically at this time which is good news. 07/07/2019 in regard to the patient's wounds today he actually seems to be doing well with his right lower extremity there really is nothing open or draining at this point this is great news. Unfortunately the left lower extremity is given him additional trouble at this time. There does not appear to be any signs of active infection nonetheless he does have a lot of edema and swelling noted at this point as well as blistering all of which has led to a much more poor appearing leg at this time compared to where it was 2 weeks ago when it was almost completely healed. Obviously this is a little discouraging for the patient. He is try to contact the lymphedema clinic in [place] he has not been able to get through to them. 07/14/2019 on evaluation today patient actually appears to be doing slightly better with regard to his left lower extremity ulcers. Overall I do feel like at least at the top of the wrap that we have been placing this area has healed quite nicely and looks much better. The remainder of the leg is showing  signs of improvement. Unfortunately in the thigh area he still has an open region on the left and again on the right he has been utilizing just a Band-Aid on an area that also opened on the thigh. Again this is an area that were not able to wrap although we did do an Ace wrap to provide some compression that something that obviously is a little less effective than the compression wraps we have been using on the lower portion of the leg. He does have an appointment with the lymphedema clinic in [facility] on Friday. 07/21/2019 on evaluation today patient appears to be doing  better with regard to his lower extremity ulcers. He has been tolerating the dressing changes without complication. Fortunately there is no signs of active infection at this time. No fevers, chills, nausea, vomiting, or diarrhea. I did receive the paperwork from the physical therapist at the lymphedema clinic in [place]. Subsequently I signed off on that this morning and sent that back to him for further progression with the treatment plan. 07/28/2019 on evaluation today patient appears to be doing very well with regard to his right lower extremity where I do not see any open wounds at this point. Fortunately he is feeling great as far as that is concerned as well. In regard to the left lower extremity he has been having issues with still several areas of weeping and edema although the upper leg is doing better his lower leg still I think is going require the compression wrap at this time. No fevers, chills, nausea, vomiting, or diarrhea. 08/04/2019 on evaluation today patient unfortunately is having new wounds on the right lower extremity. Again we have been using Unna boot wrap on that side. We switched him to using his juxta lite wrap at home. With that being said he tells me he has been using it although his legs extremely swollen and to be honest really does not appear that he has been. I cannot know that for sure however. Nonetheless he has multiple new wounds on the right lower extremity at this time. Obviously we will have to see about getting this rewrapped for him today. 08/11/2019 on evaluation today patient appears to be doing fairly well with regard to his wounds. He has been tolerating the dressing changes including the compression wraps without complication. He still has a lot of edema in his upper thigh regions bilaterally he is supposed to be seeing the lymphedema clinic on the 15th of this month once his wraps arrive for the upper part of his legs. 08/18/2019 on evaluation today  patient appears to be doing well with regard to his bilateral lower extremities at this point. He has been tolerating the dressing changes without complication. Fortunately there is no signs of active infection which is also good news. He does have a couple weeping areas on the first and second toe of the right foot he also has just a small area on the left foot upper leg and a small area on the left lower leg but overall he is doing quite well in my opinion. He is supposed to be getting his wraps shortly in fact tomorrow and then subsequently is seeing the lymphedema clinic next Wednesday on the 21st. Of note he is also leaving on the 25th to go on vacation for a week to the beach. For that reason and since there is some uncertainty about what there can be doing at lymphedema clinic next Wednesday I am get a make an appointment  for next Friday here for [place] to see what we need to do for him prior to him leaving for vacation. 10/23; patient arrives in considerable pain predominantly in the upper posterior calf just distal to the popliteal fossa also in the wound anteriorly above the major wound. This is probably cellulitis and he has had this recurrently in the past. He has no open wound on the right side and he has had an Radio broadcast assistant in that area. Finally I note that he has an area on the left posterior calf which by enlarge is mostly epithelialized. This protrudes beyond the borders of the surrounding skin in the setting of dry scaly skin and lymphedema. The patient is leaving for [facility] on Sunday. Per his longstanding pattern, he will not take his compression pumps with him predominantly out of fear that they will be stolen. He therefore asked that we put a Unna boot back on the right leg. He will also contact the wound care center in [facility] to see if they can change his dressing in the mid week. 11/3; patient returned from his vacation to [facility]. He was seen on 1 occasion at their  wound care center. They did a 2 layer compression system as they did not have our 4-layer wrap. I am not completely certain what they put on the wounds. They did not change the Unna boot on the right. The patient is also seeing a lymphedema specialist physical therapist in [place]. It appears that he has some compression sleeve for his thighs which indeed look quite a bit better than I am used to seeing. He pumps over these with his external compression pumps. 11/10; the patient has a new wound on the right medial thigh otherwise there is no open areas on the right. He has an area on the left leg posteriorly anteriorly and medially and an area over the left second toe. We have been using silver alginate. He thinks the injury on his thigh is secondary to friction from the compression sleeve he has. 11/17; the patient has a new wound on the right medial thigh last week. He thinks this is because he did not have a underlying stocking for his thigh juxta lite apparatus. He now has this. The area is fairly large and somewhat angry but I do not think he has underlying cellulitis. ooHe has a intact blister on the right anterior tibial area. ooSmall wound on the right great toe dorsally ooSmall area on the medial left calf. 11/30; the patient does not have any open areas on his right leg and we did not take his juxta lite stocking off. However he states that on Friday his compression wrap fell down lodging around his upper mid calf area. As usual this creates a lot of problems for him. He called urgently today to be seen for a nurse visit however the nurse visit turned into a provider visit because of extreme erythema and pain in the left anterior tibia extending laterally and posteriorly. The area that is problematic is extensive 10/06/2019 upon evaluation today patient actually appears to be doing poorly in regard to his left lower extremity. He Dr. Leanord Hawking did place him on doxycycline this past  Monday apparently due to the fact that he was doing much worse in regard to this left leg. Fortunately the doxycycline does seem to be helping. Unfortunately we are still having a very difficult time getting his edema under any type of control in order to anticipate discharge  at some point. The only way were really able to control his lymphedema really is with compression wraps and that has only even seemingly temporary. He has been seeing a lymphedema clinic they are trying to help in this regard but still this has been somewhat frustrating in general for the patient. 10/13/19 on evaluation today patient appears to be doing excellent with regard to his right lower extremity as far as the wounds are concerned. His swelling is still quite extensive unfortunately. He is still having a lot of drainage from the thigh areas bilaterally which is unfortunate. He's been going to lymphedema clinic but again he still really does not have this edema under control as far as his lower extremities are concern. With regard to his left lower extremity this seems to be improving and I do believe the doxycycline has been of benefit for him. He is about to complete the doxycycline. 10/20/2019 on evaluation today patient appears to be doing poorly in regard to his bilateral lower extremities. More in the right thigh he has a lot of irritation at this site unfortunately. In regard to the left lower extremity the wrap was not quite as high it appears and does seem to have caused him some trouble as well. Fortunately there is no evidence of systemic infection though he does have some blue-green drainage which has me concerned for the possibility of Pseudomonas. He tells me he is previously taking Cipro without complications and he really does not care for Levaquin however due to some of the side effects he has. He is not allergic to any medications specifically antibiotics that were aware of. 10/27/2019 on evaluation today  patient actually does appear to be for the most part doing better when compared to last week's evaluation. With that being said he still has multiple open wounds over the bilateral lower extremities. He actually forgot to start taking the Cipro and states that he still has the whole bottle. He does have several new blisters on left lower extremity today I think I would recommend he go ahead and take the Cipro based on what I am seeing at this point. 12/30-Patient comes at 1 week visit, 4 layer compression wraps on the left and Unna boot on the right, primary dressing Xtrasorb and silver alginate. Patient is taking his Cipro and has a few more days left probably 5-6, and the legs are doing better. He states he is using his compressions devices which I believe he has 11/10/2019 on evaluation today patient actually appears to be much better than last time I saw him 2 weeks ago. His wounds are significantly improved and overall I am very pleased in this regard. Fortunately there is no signs of active infection at this time. He is just a couple of days away from completing Cipro. Overall his edema is much better he has been using his lymphedema pumps which I think is also helping at this point. 11/17/2019 on evaluation today patient appears to be doing excellent in regard to his wounds in general. His legs are swollen but not nearly as much as they have been in the past. Fortunately he is tolerating the compression wraps without complication. No fevers, chills, nausea, vomiting, or diarrhea. He does have some erythema however in the distal portion of his right lower extremity specifically around the forefoot and toes there is a little bit of warmth here as well. 11/24/2019 on evaluation today patient appears to be doing well with regard to his right lower extremity  I really do not see any open wounds at this point. His left lower extremity does have several open areas and his right medial thigh also is open.  Other than this however overall the patient seems to be making good progress and I am very pleased at this point. 12/01/2019 on evaluation today patient appears to be doing poorly at this point in regard to his left lower extremity has several new blisters despite the fact that we have him in compression wraps. In fact he had a 4-layer compression wrap, his upper thigh wrapped from lymphedema clinic, and a juxta light over top of the 4 layer compression wrap the lymphedema clinic applied and despite all this he still develop blisters underneath. Obviously this does have me concerned about the fact that unfortunately despite what we are doing to try to get wounds healed he continues to have new areas arise I do not think he is ever good to be at the point where he can realistically just use wraps at home to keep things under control. Typically when we heal him it takes about 1-2 days before he is back in the clinic with severe breakdown and blistering of his lower extremities bilaterally. This is happened numerous times in the past. Unfortunately I think that we may need some help as far as overall fluid overload to kind of limit what we are seeing and get things under better control. 12/08/2019 on evaluation today patient presents for follow-up concerning his ongoing bilateral lower extremity edema. Unfortunately he is still having quite a bit of swelling the compression wraps are controlling this to some degree but he did see Dr. [name] his cardiologist I do have that available for review today as far as the appointment was concerned that was on 12/06/2019. Obviously that she has been 2 days ago. The patient states that he is only been taking the Lasix 80 mg 1 time a day he had told me previously he was taking this twice a day. Nonetheless Dr. [name] recommended this be up to 80 mg 2 times a day for the patient as he did appear to be fluid overloaded. With that being said the patient states he did this  yesterday and he was unable to go anywhere or do anything due to the fact that he was constantly having to urinate. Nonetheless I think that this is still good to be something that is important for him as far as trying to get his edema under control at all things that he is going to be able to just expect his wounds to get under control and things to be better without going through at least a period of time where he is trying to stabilize his fluid management in general and I think increasing the Lasix is likely the first step here. It was also mentioned the possibility that the patient may require metolazone. With that being said he wanted to have the patient take Lasix twice a day first and then reevaluating 2 months to see where things stand. 12/15/2019 upon evaluation today patient appears to be doing regard to his legs although his toes are showing some signs of weeping especially on the left at this point to some degree on the right. There does not appear to be any signs of active infection and overall I do feel like the compression wraps are doing well for him but he has not been able to take the Lasix at home and the increased dose that Dr. [name] recommended.  He tells me that just not go to be feasible for him. Nonetheless I think in this case he should probably send a message to Dr. [name] in order to discuss options from the standpoint of possible admission to get the fluid off or otherwise going forward. 12/22/2019 upon evaluation today patient appears to be doing fairly well with regard to his lower extremities at this point. In fact he would be doing excellent if it was not for the fact that his right anterior thigh apparently had an allergic reaction to adhesive tape that he used. The wound itself that we have been monitoring actually appears to be healed. There is a lot of irritation at this point. 12/29/2019 upon evaluation today patient appears to be doing well in regard to his lower  extremities. His left medial thigh is open and somewhat draining today but this is the only region that is open the right has done much better with the treatment utilizing the steroid cream that I prescribed for him last week. Overall I am pleased in that regard. Fortunately there is no signs of active infection at this time. No fevers, chills, nausea, vomiting, or diarrhea. 01/05/2020 upon evaluation today patient appears to be doing more poorly in regard to his right lower extremity at this point upon evaluation today. Unfortunately he continues to have issues in this regard and I think the biggest issue is controlling his edema. This obviously is not very well controlled at this point is been recommended that he use the Lasix twice a day but he has not been able to do that. Unfortunately I think this is leading to an issue where honestly he is not really able to effectively control his edema and therefore the wounds really are not doing significantly better. I do not think that he is going to be able to keep things under good control unless he is able to control his edema much better. I discussed this again in great detail with him today. 01/12/2020 good news is patient actually appears to be doing quite well today at this point. He does have an appointment with lymphedema clinic tomorrow. His legs appear healed and the toe on the left is almost completely healed. In general I am very pleased with how things stand at this point. 01/19/2020 upon evaluation today patient appears to actually be doing well in regard to his lower extremities there is nothing open at this point. Fortunately he has done extremely well more recently. Has been seeing lymphedema clinic as well. With that being said he has Velcro wraps for his lower legs as well as his upper legs. The only wound really is on his toe which is the right great toe and this is barely anything even there. With all that being said I think it is good to  be appropriate today to go ahead and switch him over to the Velcro compression wraps. 01/26/2020 upon evaluation today patient appears to be doing worse with regard to his lower extremities after last week switch him to Velcro compression wraps. Unfortunately he lasted less than 24 hours he did not have the sock portion of his Velcro wrap on the left leg and subsequently developed a blister underneath the Velcro portion. Obviously this is not good and not what we were looking for at this point. He states the lymphedema clinic did tell him to wear the wrap for 23 hours and take him off for 1 I am okay with that plan but again right now  we got a get things back under control again he may have some cellulitis noted as well. 02/02/2020 upon evaluation today patient unfortunately appears to have several areas of blistering on his bilateral lower extremities today mainly on the feet. His legs do seem to be doing somewhat better which is good news. Fortunately there is no evidence of active infection at this time. No fevers, chills, nausea, vomiting, or diarrhea. 02/16/2020 upon evaluation today patient appears to be doing well at this time with regard to his legs. He has a couple weeping areas on his toes but for the most part everything is doing better and does appear to be sealed up on his legs which is excellent news. We can continue with wrapping him at this point as he had every time we discontinue the wraps he just breaks out with new wounds. There is really no point in is going forward with this at this point. 03/08/2020 upon evaluation today patient actually appears to be doing quite well with regard to his lower extremity ulcers. He has just a very superficial and really almost nonexistent blister on the left lower extremity he has in general done very well with the compression wraps. With that being said I do not see any signs of infection at this time which is good news. 03/29/2020 upon evaluation  today patient appears to be doing well with regard to his wounds currently except for where he had several new areas that opened up due to some of the wrap slipping and causing him trouble. He states he did not realize they had slipped. Nonetheless he has a 1 area on the right and 3 new areas on the left. Fortunately there is no signs of active infection at this time which is great news. 04/05/2020 upon evaluation today patient actually appears to be doing quite well in general in regard to his legs currently. Fortunately there is no signs of active infection at this time. No fevers, chills, nausea, vomiting, or diarrhea. He tells me next week that he will actually be seen in the lymphedema clinic on Thursday at 10 AM I see him on Wednesday next week. 04/12/2020 upon evaluation today patient appears to be doing very well with regard to his lower extremities bilaterally. In fact he does not appear to have any open wounds at this point which is good news. Fortunately there is no signs of active infection at this time. No fevers, chills, nausea, vomiting, or diarrhea. 04/19/2020 upon evaluation today patient appears to be doing well with regard to his wounds currently on the bilateral lower extremities. There does not appear to be any signs of active infection at this time. Fortunately there is no evidence of systemic infection and overall very pleased at this point. Nonetheless after I held him out last week he literally had blisters the next morning already which swelled up with him being right back here in the clinic. Overall I think that he is just not can be able to be discharged with his legs the way they are he is much to volume overloaded as far as fluid is concerned and that was discussed with him today of also discussed this but should try the clinic nurse manager as well as Dr. Leanord Hawking. 04/26/2020 upon evaluation today patient appears to be doing better with regard to his wounds currently. He is making  some progress and overall swelling is under good control with the compression wraps. Fortunately there is no evidence of active infection at this  time. 05/10/2020 on evaluation today patient appears to be doing overall well in regard to his lower extremities bilaterally. He is Tolerating the compression wraps without complication and with what we are seeing currently I feel like that he is making excellent progress. There is no signs of active infection at this time. 05/24/2020 upon evaluation today patient appears to be doing well in regard to his legs. The swelling is actually quite a bit down compared to where it has been in the past. Fortunately there is no sign of active infection at this time which is also good news. With that being said he does have several wounds on his toes that have opened up at this point. 05/31/2020 upon evaluation today patient appears to be doing well with regard to his legs bilaterally where he really has no significant fluid buildup at this point overall he seems to be doing quite well. Very pleased in this regard. With regard to his toes these also seem to be drying up which is excellent. We have continue to wrap him as every time we tried as a transition to the juxta light wraps things just do not seem to get any better. 06/07/2020 upon evaluation today patient appears to be doing well with regard to his right leg at this point. Unfortunately left leg has a lot of blistering he tells me the wrap started to slide down on him when he tried to put his other Velcro wrap over top of it to help keep things in order but nonetheless still had some issues. 06/14/2020 on evaluation today patient appears to be doing well with regard to his lower extremity ulcers and foot ulcers at this point. I feel like everything is actually showing signs of improvement which is great news overall there is no signs of active infection at this time. No fevers, chills, nausea, vomiting,  or diarrhea. 06/21/2020 on evaluation today patient actually appears to be doing okay in regard to his wounds in general. With that being said the biggest issue I see is on his right foot in particular the first and second toe seem to be doing a little worse due to the fact this is staying very wet. I think he is probably getting need to change out his dressings a couple times in between each week when we see him in regard to his toes in order to keep this drier based on the location and how this is proceeding. 06/28/2020 on evaluation today patient appears to be doing a little bit more poorly overall in regard to the appearance of the skin I am actually somewhat concerned about the possibility of him having a little bit of an infection here. We discussed the course of potentially giving him a doxycycline prescription which he is taken previously with good result. With that being said I do believe that this is potentially mild and at this point easily fixed. I just do not want anything to get any worse. 07/12/2020 upon evaluation today patient actually appears to be making some progress with regard to his legs which is great news there does not appear to be any evidence of active infection. Overall very pleased with where things stand. 07/26/2020 upon evaluation today patient appears to be doing well with regard to his leg ulcers and toe ulcers at this point. He has been tolerating the compression wraps without complication overall very pleased in this regard. 08/09/2020 upon evaluation today patient appears to be doing well with regard to his lower extremities  bilaterally. Fortunately there is no signs of active infection overall I am pleased with where things stand. 08/23/2020 on evaluation today patient appears to be doing well with regard to his wound. He has been tolerating the dressing changes without complication. Fortunately there is no signs of active infection at this time. Overall his legs seem  to be doing quite well which is great news and I am very pleased in that regard. No fevers, chills, nausea, vomiting, or diarrhea. 09/13/2020 upon evaluation today patient appears to be doing okay in regard to his lower extremities. He does have a fairly large blister on the right leg which I did remove the blister tissue from today so we can get this to dry out other than that however he seems to be doing quite well. There is no signs of active infection at this time. 09/27/2020 upon evaluation today patient appears to actually be doing some better in regard to his right leg. Fortunately signs of active infection at this time which is great news. No fevers, chills, nausea, vomiting, or diarrhea. 10/04/2020 upon evaluation today patient actually appears to be showing signs of improvement which is great news with regard to his leg ulcers. Fortunately there is no signs of active infection which is great news he is still taking the antibiotics currently. No fevers, chills, nausea, vomiting, or diarrhea. Objective Constitutional Well-nourished and well-hydrated in no acute distress. Vitals Time Taken: 11:52 AM, Height: 70 in, Weight: 380.2 lbs, BMI: 54.5, Temperature: 97.6 F, Pulse: 62 bpm, Respiratory Rate: 18 breaths/min, Blood Pressure: 125/62 mmHg. Respiratory normal breathing without difficulty. Psychiatric this patient is able to make decisions and demonstrates good insight into disease process. Alert and Oriented x 3. pleasant and cooperative. General Notes: Patient's wound bed actually showed signs of good granulation currently fortunately there does not appear to be any evidence of active infection which is great news and overall I am very pleased with where things stand. No fevers, chills, nausea, vomiting, or diarrhea. Integumentary (Hair, Skin) Wound #185 status is Open. Original cause of wound was Blister. The wound is located on the Right,Lateral Lower Leg. The wound measures 14cm  length x 15.5cm width x 0.1cm depth; 170.431cm^2 area and 17.043cm^3 volume. There is Fat Layer (Subcutaneous Tissue) exposed. There is a medium amount of serosanguineous drainage noted. Foul odor after cleansing was noted. The wound margin is distinct with the outline attached to the wound base. There is small (1-33%) red granulation within the wound bed. There is a large (67-100%) amount of necrotic tissue within the wound bed including Adherent Slough. Wound #187 status is Open. Original cause of wound was Gradually Appeared. The wound is located on the Left,Medial Lower Leg. The wound measures 0cm length x 0cm width x 0cm depth; 0cm^2 area and 0cm^3 volume. There is no tunneling or undermining noted. There is a none present amount of drainage noted. The wound margin is distinct with the outline attached to the wound base. There is large (67-100%) pink, pale granulation within the wound bed. There is no necrotic tissue within the wound bed. Wound #188 status is Open. Original cause of wound was Gradually Appeared. The wound is located on the Left,Anterior Lower Leg. The wound measures 0cm length x 0cm width x 0cm depth; 0cm^2 area and 0cm^3 volume. There is no tunneling or undermining noted. There is a none present amount of drainage noted. The wound margin is distinct with the outline attached to the wound base. There is no granulation within the  wound bed. There is no necrotic tissue within the wound bed. 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 There was a Radio broadcast assistant Compression Therapy Procedure by Kevin Mu, [name]. Post procedure Diagnosis Wound #185: Same as Pre-Procedure There was a Four Layer Compression Therapy Procedure by Kevin Mu, [name]. Post procedure Diagnosis Wound #: Same as Pre-Procedure Plan Follow-up Appointments: Return Appointment in 1 week. Dressing Change Frequency: Do not change entire dressing for one week. Skin Barriers/Peri-Wound Care: Moisturizing lotion - both legs and feet and toes Other: - patient to get urea cream 40% to be used on feet and toes Wound Cleansing: May shower with protection. Primary Wound Dressing: Wound #185 Right,Lateral Lower Leg: Calcium Alginate with Silver Secondary Dressing: Wound #185 Right,Lateral Lower Leg: Dry Gauze ABD pad - as needed Edema Control: 4 layer compression: Left lower extremity Unna Boot to Right Lower Extremity Avoid standing for long periods of time Elevate legs to the level of the heart or above for 30 minutes daily and/or when sitting, a frequency of: - throughout the day Exercise regularly 1. I would recommend at this point we continue with the wound care measures as before using the Unna boot on the right leg and the 4-layer compression wrap on the left leg. 2. We will continue with silver alginate to the open wound areas. 3 I also recommend patient continue to elevate his legs much as possible he should also be using his exercise routine to try to keep edema down as well the more he can move around better off he will be. We will see patient back for reevaluation in 1 week here in the clinic. If anything worsens or changes patient will contact our office for additional recommendations. Electronic Signature(s) Signed: 10/04/2020 1:22:21 PM By: Lenda Kelp PA-C Entered By: Lenda Kelp on 10/04/2020  13:22:21 -------------------------------------------------------------------------------- SuperBill Details Patient Name: Date of Service: [name], [name] 10/04/2020 Medical Record Number: [id number] Patient Account Number: [id number] Date of Birth/Sex: Treating [name]: [birth year] (69 y.o. [name], [name] Primary Care Provider: Nicoletta Ba Other Clinician: Referring Provider: Treating Provider/Extender: Adele Dan in Treatment: 245 Diagnosis Coding ICD-10 Codes Code Description [phone] Non-pressure chronic ulcer of right calf limited to breakdown of skin L97.221 Non-pressure chronic ulcer of left calf limited to breakdown of skin 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 of bilateral lower extremity I89.0 Lymphedema, not elsewhere classified E11.622 Type 2 diabetes mellitus with other skin ulcer 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, unspecified L03.116 Cellulitis of left lower limb Facility Procedures CPT4 Code: 46962952 Description: (Facility Use Only) [phone] - APPLY UNNA BOOT RT Modifier: Quantity: 1 CPT4 Code: [id number] Description: (Facility Use Only) [id number] - APPLY MULTLAY COMPRS LWR LT LEG Modifier: 59 Quantity: 1 Physician Procedures : CPT4 Code Description Modifier [id number] - WC PHYS LEVEL 3 - EST PT ICD-10 Diagnosis Description 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 skin L97.221 Non-pressure chronic ulcer of left calf limited to breakdown of  skin I87.333 Chronic  venous hypertension (idiopathic) with ulcer and inflammation of bilateral lower extremity I89.0 Lymphedema, not elsewhere classified Quantity: 1 Electronic Signature(s) Signed: 10/04/2020 1:22:43 PM By: Lenda Kelp PA-C Entered By: Lenda Kelp on 10/04/2020 13:22:39

## 2020-10-05 NOTE — Progress Notes (Signed)
BAILEY, KOLBE ([id number]) Visit Report for 10/04/2020 Arrival Information Details Patient Name: Date of Service: CO COTE, MAYABB 10/04/2020 10:30 A M Medical Record Number: [id number] Patient Account Number: [id number] Date of Birth/Sex: Treating RN: 03/01/1951 (69 y.o. [name] Primary Care Ashaunte Standley: [name] Other Clinician: Referring Courtland Coppa: Treating Aadam Zhen/Extender: [name] in Treatment: 245 Visit Information History Since Last Visit Added or deleted any medications: No Patient Arrived: [name] new allergies or adverse reactions: No Arrival Time: 11:52 Had a fall or experienced change in No Accompanied By: self activities of daily living that may affect Transfer Assistance: None risk of falls: Patient Identification Verified: Yes Signs or symptoms of abuse/neglect since last visito No Secondary Verification Process Completed: Yes Hospitalized since last visit: No Patient Requires Transmission-Based Precautions: No Implantable device outside of the clinic excluding No Patient Has Alerts: Yes cellular tissue based products placed in the center since last visit: Has Dressing in Place as Prescribed: Yes Pain Present Now: No Electronic Signature(s) Signed: 10/04/2020 3:05:31 PM By: Sandre Kitty Entered By: Sandre Kitty on 10/04/2020 11:52:47 -------------------------------------------------------------------------------- Compression Therapy Details Patient Name: Date of Service: [name], Kaedan J. 10/04/2020 10:30 A M Medical Record Number: [id number] Patient Account Number: [id number] Date of Birth/Sex: Treating RN: Feb 14, 1951 (69 y.o. Erie Noe Primary Care Kylar Speelman: [name] Other Clinician: Referring Thao Bauza: Treating Leylany Nored/Extender: [name] in Treatment: 245 Compression Therapy Performed for Wound Assessment: Wound #185 Right,Lateral Lower Leg Performed By:  Clinician Rhae Hammock, RN Compression Type: [name] Post Procedure Diagnosis Same as Pre-procedure Electronic Signature(s) Signed: 10/05/2020 5:27:06 PM By: Rhae Hammock RN Entered By: Rhae Hammock on 10/04/2020 12:56:56 -------------------------------------------------------------------------------- Compression Therapy Details Patient Name: Date of Service: [name], Raylee J. 10/04/2020 10:30 A M Medical Record Number: [id number] Patient Account Number: [id number] Date of Birth/Sex: Treating RN: [birth year] (69 y.o. Erie Noe Primary Care Maliik Karner: [name] Other Clinician: Referring Rashied Corallo: Treating Lonn Im/Extender: [name] in Treatment: 245 Compression Therapy Performed for Wound Assessment: NonWound Condition Lymphedema - Left Leg Performed By: Clinician Rhae Hammock, RN Compression Type: Four Layer Post Procedure Diagnosis Same as Pre-procedure Electronic Signature(s) Signed: 10/05/2020 5:27:06 PM By: Rhae Hammock RN Entered By: Rhae Hammock on 10/04/2020 13:04:50 -------------------------------------------------------------------------------- Encounter Discharge Information Details Patient Name: Date of Service: [facility], Selig J. 10/04/2020 10:30 A M Medical Record Number: [id number] Patient Account Number: [id number] Date of Birth/Sex: Treating RN: [birth year] (69 y.o. [name], [name] Primary Care Delante Karapetyan: [name] Other Clinician: Referring Hadyn Azer: Treating Leianne Callins/Extender: [name] in Treatment: [phone] Encounter Discharge Information Items Discharge Condition: Stable Ambulatory Status: Cane Discharge Destination: Home Transportation: Private Auto Accompanied By: self Schedule Follow-up Appointment: Yes Clinical Summary of Care: Patient Declined Electronic Signature(s) Signed: 10/05/2020 5:27:06 PM By: Rhae Hammock RN Entered By:  Rhae Hammock on 10/04/2020 13:03:46 -------------------------------------------------------------------------------- Lower Extremity Assessment Details Patient Name: Date of Service: [name], Rivers J. 10/04/2020 10:30 A M Medical Record Number: [id number] Patient Account Number: [id number] Date of Birth/Sex: Treating RN: Jul 13, 1951 (69 y.o. Oval Linsey Primary Care Antoinett Dorman: [name] Other Clinician: Referring Capria Cartaya: Treating [name]/Extender: [name] in Treatment: 245 Edema Assessment Assessed: [Left: No] [Right: No] Edema: [Left: Yes] [Right: Yes] Calf Left: Right: Point of Measurement: 25 cm From Medial Instep 40 cm 43 cm Ankle Left: Right: Point of Measurement: 9 cm From Medial Instep 27.5 cm 28 cm Electronic Signature(s) Signed: 10/04/2020 4:46:53  PM By: [name] RN Entered By: [name] on 10/04/2020 12:02:38 -------------------------------------------------------------------------------- [place] Details Patient Name: Date of Service: [facility], Shiheem J. 10/04/2020 10:30 A M Medical Record Number: [id number] Patient Account Number: [id number] Date of Birth/Sex: Treating RN: 1951/10/09 (69 y.o. [name], [name] Primary Care Azavion Bouillon: [name] Other Clinician: Referring Mollyann Halbert: Treating Teren Zurcher/Extender: [name] in Treatment: [phone] Active Inactive Venous Leg Ulcer Nursing Diagnoses: Actual venous Insuffiency (use after diagnosis is confirmed) Goals: Patient will maintain optimal edema control Date Initiated: 09/10/2016 Target Resolution Date: 10/25/2020 Goal Status: Active Verify adequate tissue perfusion prior to therapeutic compression application Date Initiated: 09/10/2016 Date Inactivated: 11/28/2016 Goal Status: Met Interventions: Assess peripheral edema status every visit. Compression as ordered Provide education on venous  insufficiency Notes: Electronic Signature(s) Signed: 10/05/2020 5:27:06 PM By: Rhae Hammock RN Entered By: Rhae Hammock on 10/04/2020 12:55:38 -------------------------------------------------------------------------------- Pain Assessment Details Patient Name: Date of Service: [name], Jahlani J. 10/04/2020 10:30 A M Medical Record Number: [id number] Patient Account Number: [id number] Date of Birth/Sex: Treating RN: [birth year] (69 y.o. [name] Primary Care [place] Kreitzer: [name] Other Clinician: Referring Jaysean Manville: Treating Brand Siever/Extender: [name] in Treatment: [phone] Active Problems Location of Pain Severity and Description of Pain Patient Has Paino No Site Locations Pain Management and Medication Current Pain Management: Electronic Signature(s) Signed: 10/04/2020 3:05:31 PM By: Sandre Kitty Signed: 10/04/2020 5:21:24 PM By: Baruch Gouty RN, BSN Entered By: Sandre Kitty on 10/04/2020 11:53:12 -------------------------------------------------------------------------------- Patient/Caregiver Education Details Patient Name: Date of Service: [name], [name] 12/1/2021andnbsp10:30 A M Medical Record Number: [id number] Patient Account Number: [id number] Date of Birth/Gender: Treating RN: 08/14/1951 (68 y.o. [name], [place] Primary Care Physician: [name] Other Clinician: Referring Physician: Treating Physician/Extender: [name] in Treatment: [phone] Education Assessment Education Provided To: Patient Education Topics Provided Venous: Methods: Explain/Verbal Responses: State content correctly Electronic Signature(s) Signed: 10/05/2020 5:27:06 PM By: Rhae Hammock RN Entered By: Rhae Hammock on 10/04/2020 12:56:05 -------------------------------------------------------------------------------- Wound Assessment Details Patient Name: Date of Service: [name], Aloysuis J.  10/04/2020 10:30 A M Medical Record Number: [id number] Patient Account Number: [id number] Date of Birth/Sex: Treating RN: 1951-10-28 (69 y.o. [name]) [name] Primary Care Danaye Sobh: [name] Other Clinician: Referring Keyera Hattabaugh: Treating Coreyon Nicotra/Extender: [name] in Treatment: 245 Wound Status Wound Number: [id number] Primary Venous Leg Ulcer Etiology: Wound Location: Right, Lateral Lower Leg Secondary Lymphedema Wounding Event: Blister Etiology: Date Acquired: 09/13/2020 Wound Open Weeks Of Treatment: 2 Status: Clustered Wound: Yes Comorbid Chronic sinus problems/congestion, Arrhythmia, Hypertension, History: Peripheral Arterial Disease, Type II Diabetes, History of Burn, Gout, Confinement Anxiety Wound Measurements Length: (cm) 14 Width: (cm) 15.5 Depth: (cm) 0.1 Clustered Quantity: 2 Area: (cm) 170.431 Volume: (cm) 17.043 % Reduction in Area: -82.3% % Reduction in Volume: -82.3% Epithelialization: None Wound Description Classification: Full Thickness Without Exposed Support Structures Wound Margin: Distinct, outline attached Exudate Amount: Medium Exudate Type: Serosanguineous Exudate Color: red, brown Foul Odor After Cleansing: Yes Due to Product Use: No Slough/Fibrino Yes Wound Bed Granulation Amount: Small (1-33%) Exposed Structure Granulation Quality: Red Fascia Exposed: No Necrotic Amount: Large (67-100%) Fat Layer (Subcutaneous Tissue) Exposed: Yes Necrotic Quality: Adherent Slough Tendon Exposed: No Muscle Exposed: No Joint Exposed: No Bone Exposed: No Treatment Notes Wound #185 (Right, Lateral Lower Leg) 1. Cleanse With Saline Soap and water 2. Periwound Care Moisturizing lotion 3. Primary Dressing Applied Calcium Alginate Ag 4. Secondary Dressing Dry Gauze 6. Support Layer [employer]  [name] Notes 4 layer left , unna boot right Electronic Signature(s) Signed: 10/04/2020 4:46:53 PM By: [name] RN Entered  By: [name] on 10/04/2020 12:00:03 -------------------------------------------------------------------------------- Wound Assessment Details Patient Name: Date of Service: CO HERMES, WAFER 10/04/2020 10:30 A M Medical Record Number: [id number] Patient Account Number: [id number] Date of Birth/Sex: Treating RN: 08/04/1951 (69 y.o. [name]) [name] Primary Care Jhordan Kinter: [name] Other Clinician: Referring Shaquil Aldana: Treating Mescal Flinchbaugh/Extender: [name] in Treatment: 245 Wound Status Wound Number: [id number] Primary Lymphedema Etiology: Wound Location: Left, Medial Lower Leg Wound Open Wounding Event: Gradually Appeared Status: Date Acquired: 09/27/2020 Comorbid Chronic sinus problems/congestion, Arrhythmia, Hypertension, Weeks Of Treatment: 1 History: Peripheral Arterial Disease, Type II Diabetes, History of Burn, Clustered Wound: No Gout, Confinement Anxiety Wound Measurements Length: (cm) Width: (cm) Depth: (cm) Area: (cm) Volume: (cm) 0 % Reduction in Area: 100% 0 % Reduction in Volume: 100% 0 Epithelialization: Large (67-100%) 0 Tunneling: No 0 Undermining: No Wound Description Classification: Full Thickness Without Exposed Support Structures Wound Margin: Distinct, outline attached Exudate Amount: None Present Foul Odor After Cleansing: No Slough/Fibrino No Wound Bed Granulation Amount: Large (67-100%) Exposed Structure Granulation Quality: Pink, Pale Fascia Exposed: No Necrotic Amount: None Present (0%) Fat Layer (Subcutaneous Tissue) Exposed: No Tendon Exposed: No Muscle Exposed: No Joint Exposed: No Bone Exposed: No Electronic Signature(s) Signed: 10/04/2020 4:46:53 PM By: [name] RN Entered By: [name] on 10/04/2020 12:01:13 -------------------------------------------------------------------------------- Wound Assessment Details Patient Name: Date of Service: CO KITO, [name] 10/04/2020 10:30 A M Medical  Record Number: [id number] Patient Account Number: [id number] Date of Birth/Sex: Treating RN: June 13, 1951 (69 y.o. [name]) [name] Primary Care Auri Jahnke: [name] Other Clinician: Referring Reah Justo: Treating Selvin Yun/Extender: [name] in Treatment: 245 Wound Status Wound Number: [id number] Primary Lymphedema Etiology: Wound Location: Left, Anterior Lower Leg Wound Open Wounding Event: Gradually Appeared Status: Date Acquired: 09/27/2020 Comorbid Chronic sinus problems/congestion, Arrhythmia, Hypertension, Weeks Of Treatment: 1 History: Peripheral Arterial Disease, Type II Diabetes, History of Burn, Clustered Wound: No Gout, Confinement Anxiety Wound Measurements Length: (cm) Width: (cm) Depth: (cm) Area: (cm) Volume: (cm) 0 % Reduction in Area: 100% 0 % Reduction in Volume: 100% 0 Epithelialization: Large (67-100%) 0 Tunneling: No 0 Undermining: No Wound Description Classification: Full Thickness Without Exposed Support Structures Wound Margin: Distinct, outline attached Exudate Amount: None Present Foul Odor After Cleansing: No Slough/Fibrino No Wound Bed Granulation Amount: None Present (0%) Exposed Structure Necrotic Amount: None Present (0%) Fascia Exposed: No Fat Layer (Subcutaneous Tissue) Exposed: No Tendon Exposed: No Muscle Exposed: No Joint Exposed: No Bone Exposed: No Electronic Signature(s) Signed: 10/04/2020 4:46:53 PM By: [name] RN Entered By: [name] on 10/04/2020 12:01:46 -------------------------------------------------------------------------------- [place] Details Patient Name: Date of Service: [name], Shain J. 10/04/2020 10:30 A M Medical Record Number: [id number] Patient Account Number: [id number] Date of Birth/Sex: Treating RN: [birth year] (69 y.o. [name] Primary Care Ari Bernabei: [name] Other Clinician: Referring Chan Rosasco: Treating Khanh Cordner/Extender: [name] in Treatment: 245 Vital Signs Time Taken: 11:52 Temperature (F): 97.6 Height (in): 70 Pulse (bpm): 62 Weight (lbs): 380.2 Respiratory Rate (breaths/min): 18 Body Mass Index (BMI): 54.5 Blood Pressure (mmHg): 125/62 Reference Range: 80 - 120 mg / dl Electronic Signature(s) Signed: 10/04/2020 3:05:31 PM By: Sandre Kitty Entered By: Sandre Kitty on 10/04/2020 11:53:03

## 2020-10-06 ENCOUNTER — Other Ambulatory Visit: Payer: Self-pay

## 2020-10-11 ENCOUNTER — Encounter: Payer: Self-pay | Admitting: Family Medicine

## 2020-10-11 ENCOUNTER — Ambulatory Visit (INDEPENDENT_AMBULATORY_CARE_PROVIDER_SITE_OTHER): Payer: Medicare Other | Admitting: Family Medicine

## 2020-10-11 ENCOUNTER — Encounter (HOSPITAL_BASED_OUTPATIENT_CLINIC_OR_DEPARTMENT_OTHER): Payer: Medicare Other | Admitting: Physician Assistant

## 2020-10-11 ENCOUNTER — Other Ambulatory Visit: Payer: Self-pay

## 2020-10-11 VITALS — BP 127/62 | HR 61 | Temp 97.8°F | Resp 16 | Ht 69.0 in | Wt 379.8 lb

## 2020-10-11 DIAGNOSIS — L03116 Cellulitis of left lower limb: Secondary | ICD-10-CM | POA: Diagnosis not present

## 2020-10-11 DIAGNOSIS — L03119 Cellulitis of unspecified part of limb: Secondary | ICD-10-CM | POA: Diagnosis not present

## 2020-10-11 DIAGNOSIS — I890 Lymphedema, not elsewhere classified: Secondary | ICD-10-CM | POA: Diagnosis not present

## 2020-10-11 DIAGNOSIS — E11622 Type 2 diabetes mellitus with other skin ulcer: Secondary | ICD-10-CM | POA: Diagnosis not present

## 2020-10-11 DIAGNOSIS — L97221 Non-pressure chronic ulcer of left calf limited to breakdown of skin: Secondary | ICD-10-CM | POA: Diagnosis not present

## 2020-10-11 DIAGNOSIS — L97211 Non-pressure chronic ulcer of right calf limited to breakdown of skin: Secondary | ICD-10-CM | POA: Diagnosis not present

## 2020-10-11 DIAGNOSIS — I482 Chronic atrial fibrillation, unspecified: Secondary | ICD-10-CM

## 2020-10-11 DIAGNOSIS — E118 Type 2 diabetes mellitus with unspecified complications: Secondary | ICD-10-CM | POA: Diagnosis not present

## 2020-10-11 DIAGNOSIS — E1140 Type 2 diabetes mellitus with diabetic neuropathy, unspecified: Secondary | ICD-10-CM | POA: Diagnosis not present

## 2020-10-11 DIAGNOSIS — I10 Essential (primary) hypertension: Secondary | ICD-10-CM

## 2020-10-11 DIAGNOSIS — I87333 Chronic venous hypertension (idiopathic) with ulcer and inflammation of bilateral lower extremity: Secondary | ICD-10-CM | POA: Diagnosis not present

## 2020-10-11 DIAGNOSIS — N183 Chronic kidney disease, stage 3 unspecified: Secondary | ICD-10-CM

## 2020-10-11 DIAGNOSIS — R7989 Other specified abnormal findings of blood chemistry: Secondary | ICD-10-CM

## 2020-10-11 LAB — BASIC METABOLIC PANEL
BUN: 25 mg/dL — ABNORMAL HIGH (ref 6–23)
CO2: 28 mEq/L (ref 19–32)
Calcium: 8.7 mg/dL (ref 8.4–10.5)
Chloride: 101 mEq/L (ref 96–112)
Creatinine, Ser: 2.11 mg/dL — ABNORMAL HIGH (ref 0.40–1.50)
GFR: 31.34 mL/min — ABNORMAL LOW (ref >60.00)
Glucose, Bld: 126 mg/dL — ABNORMAL HIGH (ref 70–99)
Potassium: 5.3 mEq/L — ABNORMAL HIGH (ref 3.5–5.1)
Sodium: 137 mEq/L (ref 135–145)

## 2020-10-11 LAB — HEMOGLOBIN A1C: Hgb A1c MFr Bld: 7.6 % — ABNORMAL HIGH (ref 4.6–6.5)

## 2020-10-11 NOTE — Progress Notes (Signed)
[name], [name] ([id number]) Visit Report for 10/11/2020 SuperBill Details Patient Name: Date of Service: [name], [name] 10/11/2020 Medical Record Number: [id number] Patient Account Number: [id number] Date of Birth/Sex: Treating RN: June 02, 1951 (69 y.o. 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 of bilateral lower extremity I89.0 Lymphedema, not elsewhere classified E11.622 Type 2 diabetes mellitus with other skin ulcer 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, unspecified L03.116 Cellulitis of left lower limb Facility Procedures CPT4 Code Description Modifier Quantity [id number] (Facility Use Only) [phone] - APPLY UNNA BOOT RT 1 [id number] (Facility Use Only) [phone] - APPLY MULTLAY COMPRS LWR LT LEG 1 Electronic Signature(s) Signed: 10/11/2020 12:03:28 PM By: Fonnie Mu RN Signed: 10/11/2020 4:01:55 PM By: Lenda Kelp PA-C Entered By: Fonnie Mu on 10/11/2020 12:03:27

## 2020-10-11 NOTE — Progress Notes (Signed)
OFFICE VISIT  10/11/2020  CC:  Chief Complaint  Patient presents with  . Follow-up    [name], pt is fasting    HPI:    Patient is a 69 y.o. Caucasian male who presents for 3 mo f/u DM, HTN, CRI III, and chronic a-fib. A/P as of last visit: "1) DM 2 Cost issues with saxenda so he won't be on this long. He does well with metformin and with adjusting his humalog 75/25 mix. HbA1c and lytes/cr today. FLP and hepatic panel today.  2) HTN: The current medical regimen is effective;  continue present plan and medications. Lytes/cr today.    3) CRI III: avoids NSAIDs, decent hydration habits. Lytes/cr today.  4) Chronic a-fib: doing well. Continue  CCB, BB, DOAC. CBC today.  5) Preventative health: PSA today. Flu vaccine today."  INTERIM HX: Feeling well, no acute complaints. All labs were stable at last f/u visit with me. HbA1c was 7.5% and I recommended hepush the insulin dose by 1-2 units at each dosing and get more strict on diabetic diet if possible.  Gluc this morning 140, says this is similar to other recents, giving 75/25 insulin 20-25 U bid.   Not on saxenda by bariatric clinic anymore.  No hypoglycemia. Denies burning, tingling, or numbness.  No ulcerations or skin abnormalities on feet. Gets periodic sensory exam testing at wound center.  Recent R LL ulcer infection, finishing up bactrim per wound clinic.  Home bp's normal.  No palpitations or heart racing. No home HR data available.  Daily wt's at home show he's gradually losing 1/2 -1 lb per week. Taking xarelto daily, no melena or BRBPR or other signs of bleeding.  ROS: no fevers, no CP, no SOB, no wheezing, no cough, no dizziness, no HAs, no rashes, no melena/hematochezia.  No polyuria or polydipsia.  No myalgias or arthralgias.  No focal weakness, paresthesias, or tremors.  No acute vision or hearing abnormalities. No n/v/d or abd pain.  No palpitations.    Past Medical History:  Diagnosis Date  .  ALLERGIC RHINITIS 08/11/2006  . ASTHMA 08/11/2006  . Chronic atrial fibrillation (HCC) 08/2014  . Chronic combined systolic and diastolic CHF (congestive heart failure) [facility](HCC)    Cardiology f/u 12/2017: pt volume overloaded (R heart dysf suspected), BNP very high, lasix increased.  Repeat echo 12/2017: normal LV EF, mild DD, +RV syst dysfxn, mod pulm HTN, biatrial enlgmt.  . Chronic constipation   . Chronic renal insufficiency, stage 2 (mild)    Borderline stage III (GFR 60s).  . DIABETES MELLITUS, TYPE II 08/11/2006  . DM W/RENAL MANIFESTATIONS, TYPE II 04/20/2007  . HYPERTENSION 08/11/2006  . Impaired mobility and endurance   . MRSA infection 06/2018   LL venous stasis ulcer infected  . Normocytic anemia 2016-2019   03/2018 B12 normal, iron ok (ferritin borderline low).  Plan repeat CBC at f/u summer 2019 and if decreased from baseline will check hemoccults and iron labs again.  . OBESITY, MORBID 12/14/2007   saxenda started 05/2020 by [facility] wt mgmt center  . OSA (obstructive sleep apnea)    to get sleep study with [facility]) as of 12/03/2018 consult.  . 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  [id number] ID started him on amoxil prophylaxis---ineffective.  End 2018/Jan 2019 penicillin G injections prophyl helpful but pt declined to continue this as of 01/2018 ID f/u.  ID talked him into resuming monthly penicillin G as  of 02/2018 f/u.    Marland Kitchen Restless leg syndrome    Rx'd clonazepam 09/2017 and pt refused to take it after reading the medication's potential side effects.  . Venous stasis ulcers of both lower extremities (HCC)    Severe lymphedema.  wound clinic care ongoing as of 01/2018    Past Surgical History:  Procedure Laterality Date  . Carotid dopplers  07/23/2018   Left NORMAL.  Right 1-39% ICA stenosis, with <50% distal CCA stenosis (not hemodynamically significant)  . TRANSTHORACIC ECHOCARDIOGRAM  11/2007;  09/2014; 11/2015;12/2017   LV fxn normal, EF normal, mild dilation of left atrium.  2015 grade II diast dysfxn.  2017 EF 55-60%. 2019: LVEF 60-65%, mild RV syst dysf,biatrial enlgmt, mod pulm htn.  Marland Kitchen URETERAL STENT PLACEMENT    . virtual colonoscopy  01/2011   Normal    Outpatient Medications Prior to Visit  Medication Sig Dispense Refill  . arginine 500 MG tablet Take by mouth.    Marland Kitchen b complex vitamins capsule Take 1 capsule by mouth every morning.    . butalbital-acetaminophen-caffeine (FIORICET WITH CODEINE) 50-325-40-30 MG capsule TAKE 1 TO 2 CAPSULES BY  MOUTH EVERY 6 HOURS AS  NEEDED FOR HEADACHE(S)  MANUFACTURER RECOMMENDS NOT EXCEEDING 6 CAPSULES/DAY 60 capsule 0  . cetirizine (ZYRTEC) 10 MG tablet Take 10 mg by mouth daily.    . Coenzyme Q10 (CO Q 10) 10 MG CAPS Take by mouth. Reported on 02/19/2016    . diltiazem (TIAZAC) 240 MG 24 hr capsule TAKE 1 CAPSULE BY MOUTH  DAILY 90 capsule 1  . furosemide (LASIX) 40 MG tablet TAKE 2 TABLETS BY MOUTH TWO TIMES DAILY (Patient taking differently: Take 80 mg in the mornings and 40 mg in the evenings) 360 tablet 3  . Glutamine 500 MG CAPS Take by mouth.    Marland Kitchen HYDROcodone-acetaminophen (NORCO/VICODIN) 5-325 MG tablet 1-2 tabs po bid prn pain 60 tablet 0  . Insulin Lispro Prot & Lispro (HUMALOG MIX 75/25 KWIKPEN) (75-25) 100 UNIT/ML Kwikpen INJECT SUBCUTANEOUSLY 25  UNITS EVERY MORNING AND 20  UNITS EVERY EVENING AT  SUPPER 45 mL 3  . Insulin Pen Needle (BD ULTRA-FINE PEN NEEDLES) 29G X 12.7MM MISC USE TWICE DAILY 200 each 3  . linezolid (ZYVOX) 600 MG tablet Take 600 mg by mouth 2 (two) times daily.    . Liraglutide -Weight Management (SAXENDA [place]) Inject 1.2 mg into the skin.    Marland Kitchen lisinopril (ZESTRIL) 40 MG tablet TAKE 1 TABLET BY MOUTH  DAILY 90 tablet 1  . metFORMIN (GLUCOPHAGE) 1000 MG tablet TAKE 1 TABLET BY MOUTH  TWICE DAILY WITH MEALS 180 tablet 1  . metoprolol tartrate (LOPRESSOR) 50 MG tablet TAKE 1 AND 1/2 TABLETS BY  MOUTH TWICE DAILY 270  tablet 3  . Multiple Vitamin (MULTIVITAMIN) tablet Take 1 tablet by mouth daily.    [name] ULTRA test strip CHECK BLOOD SUGAR TWICE  DAILY 200 strip 3  . rivaroxaban (XARELTO) 20 MG TABS tablet TAKE 1 TABLET BY MOUTH ONCE DAILY WITH SUPPER 90 tablet 3  . saw palmetto 500 MG capsule Take 500 mg by mouth daily.    Marland Kitchen sulfamethoxazole-trimethoprim (BACTRIM DS) 800-160 MG tablet Take 1 tablet by mouth 2 (two) times daily.    Marland Kitchen terazosin (HYTRIN) 10 MG capsule TAKE 1 CAPSULE BY MOUTH  ONCE DAILY AT BEDTIME 90 capsule 3  . vitamin E 400 UNIT capsule Take 400 Units by mouth every morning. Selenium 50mg     . Insulin Pen Needle (  BD ULTRA-FINE PEN NEEDLES) 29G X 12.7MM MISC 1 each by Other route 2 (two) times daily. 200 each 2  . doxycycline (VIBRAMYCIN) 100 MG capsule Take 100 mg by mouth 2 (two) times daily. (Patient not taking: Reported on 10/11/2020)    . Insulin Pen Needle (B-D ULTRAFINE III SHORT PEN) 31G X 8 MM MISC USE 1 PEN NEEDLE TWICE DAILY (Patient not taking: Reported on 10/11/2020) 100 each 3   Facility-Administered Medications Prior to Visit  Medication Dose Route Frequency Provider Last Rate Last Admin  . penicillin g benzathine (BICILLIN LA) 1200000 UNIT/2ML injection 600,000 Units  600,000 Units Intramuscular Q30 days [name], MD   600,000 Units at 04/12/20 0933  . penicillin g benzathine (BICILLIN LA) 1200000 UNIT/2ML injection 600,000 Units  600,000 Units Intramuscular Q30 days [name], MD   600,000 Units at 04/12/20 0936  . penicillin g benzathine (BICILLIN LA) 1200000 UNIT/2ML injection 600,000 Units  600,000 Units Intramuscular Q30 days [name], MD   600,000 Units at 09/13/20 [phone]  . penicillin g benzathine (BICILLIN LA) 1200000 UNIT/2ML injection 600,000 Units  600,000 Units Intramuscular Q30 days [name], MD   600,000 Units at 09/13/20 3817    Allergies  Allergen Reactions  . Other Other (See Comments)    Sneezing, coughing    ROS As  per HPI  PE: Vitals with BMI 10/11/2020 07/12/2020 04/12/2020  Height 5\' 9"  - 5\' 9"   Weight 379 lbs 13 oz 380 lbs 10 oz 380 lbs 10 oz  BMI 56.06 - 56.18  Systolic 127 158  Diastolic 62 63 66  Pulse 61 68 52     Gen: Alert, well appearing.  Patient is oriented to person, place, time, and situation. AFFECT: pleasant, lucid thought and speech. CV: RRR, no m/r/g.   LUNGS: CTA bilat, nonlabored resps, good aeration in all lung fields. EXT: he has both LL's and feet wrapped today as usual.  LABS:  Lab Results  Component Value Date   TSH 4.40 06/08/2018   Lab Results  Component Value Date   WBC 7.7 07/12/2020   HGB 12.7 (L) 07/12/2020   HCT 38.8 (L) 07/12/2020   MCV 88.9 07/12/2020   PLT 208.0 07/12/2020   Lab Results  Component Value Date   IRON 45 03/10/2018   FERRITIN 26.4 03/10/2018   Lab Results  Component Value Date   VITAMINB12 592 03/10/2018    Lab Results  Component Value Date   CREATININE 1.27 07/12/2020   BUN 17 07/12/2020   NA 138 07/12/2020   K 4.3 07/12/2020   CL 100 07/12/2020   CO2 29 07/12/2020   Lab Results  Component Value Date   ALT 10 07/12/2020   AST 14 07/12/2020   ALKPHOS 70 07/12/2020   BILITOT 1.1 07/12/2020   Lab Results  Component Value Date   CHOL 97 07/12/2020   Lab Results  Component Value Date   HDL 36.50 (L) 07/12/2020   Lab Results  Component Value Date   LDLCALC 49 07/12/2020   Lab Results  Component Value Date   TRIG 61.0 07/12/2020   Lab Results  Component Value Date   CHOLHDL 3 07/12/2020   Lab Results  Component Value Date   PSA 0.35 07/12/2020   PSA 0.71 07/06/2019   PSA 0.26 03/09/2018   Lab Results  Component Value Date   HGBA1C 7.5 (H) 07/12/2020   IMPRESSION AND PLAN:  1) DM 2, control pretty good, adjusting 75/25 insulin per  SS.  Metformin 1000 bid. Periodic feet exams at wound clinic are normal. A1c and BMET today.  He can't give urine specimen today so I'll do microalb/cr next f/u visit  in 3 mo. No med changes at this time.  2) HTN: stable.  Cont lisin, dilt, metop, lasix. BMET today.  3) Chronic a-fib, rate controlled and anticoagulated. No signs of overt/occult bleeding. No med changes. BMET today.  4) Prev healthcare: Tdap booster next o/v. Next PSA due 07/2021. Colon ca scr: virtual colonoscopy NEG 2021.  Plan iFOB or cologuard 2022.  5) Chronic lymphedema, with recurrent cellulitis and venous stasis ulcers of lower legs--continue monthly pen G inj as per Inf Dz MD rec's-->given today. Cont lasix 80 qAM and 40 qPM.  6) CRI III: avoids NSAIDs.  Hydrates fine. Lytes/cr today.  An After Visit Summary was printed and given to the patient.  FOLLOW UP: No follow-ups on file.  Signed:  Santiago Bumpers, MD           10/11/2020

## 2020-10-12 ENCOUNTER — Encounter: Payer: Self-pay | Admitting: Family Medicine

## 2020-10-12 NOTE — Progress Notes (Addendum)
[name], [name] ([id number]) Visit Report for 10/11/2020 Arrival Information Details Patient Name: Date of Service: [name], [name] 10/11/2020 10:30 A M Medical Record Number: [id number] Patient Account Number: [id number] Date of Birth/Sex: Treating RN: Mar 08, 1951 (69 y.o. Charlean Merl, [name] Primary Care Jazlin Tapscott: Nicoletta Ba Other Clinician: Referring Shalese Strahan: Treating Staisha Winiarski/Extender: Adele Dan in Treatment: 246 Visit Information History Since Last Visit Added or deleted any medications: No Patient Arrived: Gilmer Mor Any new allergies or adverse reactions: No Arrival Time: 11:55 Had a fall or experienced change in No Accompanied By: self activities of daily living that may affect Transfer Assistance: None risk of falls: Patient Identification Verified: Yes Signs or symptoms of abuse/neglect since last visito No Secondary Verification Process Completed: Yes Hospitalized since last visit: No Patient Requires Transmission-Based Precautions: No Implantable device outside of the clinic excluding No Patient Has Alerts: Yes cellular tissue based products placed in the center since last visit: Has Dressing in Place as Prescribed: Yes Pain Present Now: No Electronic Signature(s) Signed: 10/11/2020 11:56:02 AM By: Fonnie Mu RN Entered By: Fonnie Mu on 10/11/2020 11:56:01 -------------------------------------------------------------------------------- Compression Therapy Details Patient Name: Date of Service: [name], [name] 10/11/2020 10:30 A M Medical Record Number: [id number] Patient Account Number: [id number] Date of Birth/Sex: Treating RN: 26-Aug-1951 (69 y.o. Lucious Groves Primary Care Verlia Kaney: Nicoletta Ba Other Clinician: Referring Britten Parady: Treating Weslynn Ke/Extender: Adele Dan in Treatment: 246 Compression Therapy Performed for Wound Assessment: Wound #185 Right,Lateral Lower  Leg Performed By: Clinician Fonnie Mu, RN Compression Type: Henriette Combs Electronic Signature(s) Signed: 10/11/2020 11:58:01 AM By: Fonnie Mu RN Entered By: Fonnie Mu on 10/11/2020 11:58:00 -------------------------------------------------------------------------------- Compression Therapy Details Patient Name: Date of Service: [name], [name] 10/11/2020 10:30 A M Medical Record Number: [id number] Patient Account Number: [id number] Date of Birth/Sex: Treating RN: 1950/12/19 (69 y.o. Lucious Groves Primary Care Aime Carreras: Other Clinician: Nicoletta Ba Referring Rashan Patient: Treating Brittne Kawasaki/Extender: Adele Dan in Treatment: 246 Compression Therapy Performed for Wound Assessment: NonWound Condition Lymphedema - Left Leg Performed By: Clinician Fonnie Mu, RN Compression Type: Four Layer Electronic Signature(s) Signed: 10/11/2020 11:59:24 AM By: Fonnie Mu RN Entered By: Fonnie Mu on 10/11/2020 11:59:23 -------------------------------------------------------------------------------- Encounter Discharge Information Details Patient Name: Date of Service: [name], [name] 10/11/2020 10:30 A M Medical Record Number: [id number] Patient Account Number: [id number] Date of Birth/Sex: Treating RN: [birth year] (69 y.o. Charlean Merl, [name] Primary Care Thelia Tanksley: Nicoletta Ba Other Clinician: Referring Nance Mccombs: Treating Mase Dhondt/Extender: Adele Dan in Treatment: [phone] Encounter Discharge Information Items Discharge Condition: Stable Ambulatory Status: Cane Discharge Destination: Home Transportation: Private Auto Accompanied By: self Schedule Follow-up Appointment: Yes Clinical Summary of Care: Patient Declined Electronic Signature(s) Signed: 10/11/2020 12:02:16 PM By: Fonnie Mu RN Entered By: Fonnie Mu on 10/11/2020  12:02:16 -------------------------------------------------------------------------------- Patient/Caregiver Education Details Patient Name: Date of Service: [name], [name] 12/8/2021andnbsp10:30 A M Medical Record Number: [id number] Patient Account Number: [id number] Date of Birth/Gender: Treating RN: 1951-08-14 (69 y.o. Lucious Groves Primary Care Physician: Nicoletta Ba Other Clinician: Referring Physician: Treating Physician/Extender: Adele Dan in Treatment: [phone] Education Assessment Education Provided To: Patient Education Topics Provided Venous: Methods: Explain/Verbal Responses: Reinforcements needed Electronic Signature(s) Signed: 10/12/2020 3:27:39 PM By: Fonnie Mu RN Entered By: Fonnie Mu on 10/11/2020 12:02:02 -------------------------------------------------------------------------------- Wound Assessment Details Patient Name: Date of Service: [name], [name] 10/11/2020 10:30 A M Medical Record Number: [id number] Patient Account Number: [id number] Date of Birth/Sex:  Treating RN: 1951/09/12 (69 y.o. Charlean Merl, [name] Primary Care Aja Bolander: Nicoletta Ba Other Clinician: Referring Lorely Bubb: Treating Sarabeth Benton/Extender: Adele Dan in Treatment: 246 Wound Status Wound Number: [id number] Primary Etiology: Venous Leg Ulcer Wound Location: Right, Lateral Lower Leg Secondary Etiology: Lymphedema Wounding Event: Blister Wound Status: Open Date Acquired: 09/13/2020 Weeks Of Treatment: 3 Clustered Wound: Yes Wound Measurements Length: (cm) 14 Width: (cm) 15.5 Depth: (cm) 0.1 Area: (cm) 170.431 Volume: (cm) 17.043 % Reduction in Area: -82.3% % Reduction in Volume: -82.3% Wound Description Classification: Full Thickness Without Exposed Support Structur es Treatment Notes Wound #185 (Lower Leg) Wound Laterality: Right, Lateral Cleanser Soap and Water Discharge Instruction: May  shower and wash wound with dial antibacterial soap and water prior to dressing change. Peri-Wound Care Topical Primary Dressing KerraCel Ag Gelling Fiber Dressing, 4x5 in (silver alginate) Discharge Instruction: Apply silver alginate to wound bed as instructed Secondary Dressing Woven Gauze Sponge, Non-Sterile 4x4 in Discharge Instruction: Apply over primary dressing as directed. ABD Pad, 5x9 Discharge Instruction: Apply over primary dressing as directed. Secured With Compression Wrap Unnaboot w/Calamine, 4x10 (in/yd) Discharge Instruction: Apply Unnaboot as directed. Compression Stockings Add-Ons Electronic Signature(s) Signed: 10/12/2020 3:27:39 PM By: Fonnie Mu RN Entered By: Fonnie Mu on 10/11/2020 11:56:36 -------------------------------------------------------------------------------- Vitals Details Patient Name: Date of Service: [name], [name] 10/11/2020 10:30 A M Medical Record Number: [id number] Patient Account Number: [id number] Date of Birth/Sex: Treating RN: 10/01/1951 (69 y.o. Charlean Merl, [name] Primary Care Dang Mathison: Nicoletta Ba Other Clinician: Referring Darcelle Herrada: Treating Lisaann Atha/Extender: Adele Dan in Treatment: 246 Vital Signs Time Taken: 11:00 Temperature (F): 98 Height (in): 70 Pulse (bpm): 65 Weight (lbs): 380.2 Respiratory Rate (breaths/min): 22 Body Mass Index (BMI): 54.5 Blood Pressure (mmHg): 105/61 Reference Range: 80 - 120 mg / dl Electronic Signature(s) Signed: 10/11/2020 11:56:23 AM By: Fonnie Mu RN Entered By: Fonnie Mu on 10/11/2020 11:56:23

## 2020-10-13 NOTE — Addendum Note (Signed)
Addended by: Paschal Dopp on: 10/13/2020 01:15 PM   Modules accepted: Orders

## 2020-10-18 ENCOUNTER — Encounter (HOSPITAL_BASED_OUTPATIENT_CLINIC_OR_DEPARTMENT_OTHER): Payer: Medicare Other | Admitting: Physician Assistant

## 2020-10-18 ENCOUNTER — Other Ambulatory Visit: Payer: Self-pay

## 2020-10-18 ENCOUNTER — Ambulatory Visit (INDEPENDENT_AMBULATORY_CARE_PROVIDER_SITE_OTHER): Payer: Medicare Other

## 2020-10-18 DIAGNOSIS — I87333 Chronic venous hypertension (idiopathic) with ulcer and inflammation of bilateral lower extremity: Secondary | ICD-10-CM | POA: Diagnosis not present

## 2020-10-18 DIAGNOSIS — R7989 Other specified abnormal findings of blood chemistry: Secondary | ICD-10-CM

## 2020-10-18 DIAGNOSIS — I890 Lymphedema, not elsewhere classified: Secondary | ICD-10-CM | POA: Diagnosis not present

## 2020-10-18 DIAGNOSIS — E11622 Type 2 diabetes mellitus with other skin ulcer: Secondary | ICD-10-CM | POA: Diagnosis not present

## 2020-10-18 DIAGNOSIS — E1140 Type 2 diabetes mellitus with diabetic neuropathy, unspecified: Secondary | ICD-10-CM | POA: Diagnosis not present

## 2020-10-18 DIAGNOSIS — L97211 Non-pressure chronic ulcer of right calf limited to breakdown of skin: Secondary | ICD-10-CM | POA: Diagnosis not present

## 2020-10-18 DIAGNOSIS — L03116 Cellulitis of left lower limb: Secondary | ICD-10-CM | POA: Diagnosis not present

## 2020-10-18 DIAGNOSIS — L97221 Non-pressure chronic ulcer of left calf limited to breakdown of skin: Secondary | ICD-10-CM | POA: Diagnosis not present

## 2020-10-18 LAB — URINALYSIS, ROUTINE W REFLEX MICROSCOPIC
Bilirubin Urine: NEGATIVE
Hgb urine dipstick: NEGATIVE
Nitrite: NEGATIVE
Specific Gravity, Urine: 1.025 (ref 1.000–1.030)
Total Protein, Urine: 30 — AB
Urine Glucose: NEGATIVE
Urobilinogen, UA: 1 (ref 0.0–1.0)
pH: 6.5 (ref 5.0–8.0)

## 2020-10-18 LAB — BASIC METABOLIC PANEL
BUN: 16 mg/dL (ref 6–23)
CO2: 29 mEq/L (ref 19–32)
Calcium: 8.5 mg/dL (ref 8.4–10.5)
Chloride: 103 mEq/L (ref 96–112)
Creatinine, Ser: 1.35 mg/dL (ref 0.40–1.50)
GFR: 53.56 mL/min — ABNORMAL LOW (ref >60.00)
Glucose, Bld: 117 mg/dL — ABNORMAL HIGH (ref 70–99)
Potassium: 4.7 mEq/L (ref 3.5–5.1)
Sodium: 139 mEq/L (ref 135–145)

## 2020-10-18 NOTE — Progress Notes (Signed)
DONYELL, DING ([id number]) Visit Report for 10/18/2020 Chief Complaint Document Details Patient Name: Date of Service: CO GREELY, ATIYEH 10/18/2020 10:30 A M Medical Record Number: [id number] Patient Account Number: [id number] Date of Birth/Sex: Treating RN: 1951-10-06 (69 y.o. [name] Primary Care Provider: Nicoletta Ba Other Clinician: Referring Provider: Treating Provider/Extender: Adele Dan in Treatment: [phone] Information Obtained from: Patient Chief Complaint patient is here for evaluation venous/lymphedema weeping Electronic Signature(s) Signed: 10/18/2020 12:22:57 PM By: Lenda Kelp PA-C Previous Signature: 10/18/2020 11:51:58 AM Version By: Lenda Kelp PA-C Entered By: Lenda Kelp on 10/18/2020 12:22:57 -------------------------------------------------------------------------------- HPI Details Patient Name: Date of Service: [name], Saahas J. 10/18/2020 10:30 A M Medical Record Number: [id number] Patient Account Number: [id number] Date of Birth/Sex: Treating RN: 1951/04/15 (69 y.o. [name] Primary Care Provider: Nicoletta Ba Other Clinician: Referring Provider: Treating Provider/Extender: Adele Dan in Treatment: 247 History of Present Illness HPI Description: Referred by PCP for consultation. Patient has long standing history of BLE venous stasis, no prior ulcerations. At beginning of month, developed cellulitis and weeping. Received IM Rocephin followed by Keflex and resolved. Wears compression stocking, appr 6 months old. Not sure strength. No present drainage. 01/22/16 this is a patient who is a type II diabetic on insulin. 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 He tells me he religiously wears pressure stockings of uncertain strength. He was here with weeping edema about 8 months ago but did not have an open wound. Roughly a month ago he had a  reopening on his bilateral legs. He is been using bandages and Neosporin. He does not complain of pain. 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 Last BUN/creatinine I have is from 11/20/15 at 13 and 1.0 respectively 01/29/16; patient arrives today having tolerated the Profore wrap. He brought in his stockings and these are 18 mmHg stockings he bought from [employer]. The compression here is likely inadequate. He does not complain of pain or excessive drainage she has no systemic symptoms. The wound on the right looks improved as does the one on the left although one on the left is more substantial with still tissue at risk below the actual wound area on the bilateral posterior calf 02/05/16; patient arrives with poor edema control. He states that we did put a 4 layer compression on it last week. No weight appear 5 this. 02/12/16; the area on the posterior right Has healed. The left Has a substantial wound that has necrotic surface eschar that requires a debridement with a curette. 02/16/16;the patient called or a Nurse visit secondary to increased swelling. He had been in earlier in the week with his right leg healed. He was transitioned to is on pressure stocking on the right leg with the only open wound on the left, a substantial area on the left posterior calf. Note he has a history of severe lower extremity edema, he has a history of chronic atrial fibrillation but not heart failure per my notes but I'll need to research this. He is not complaining of chest pain shortness of breath or orthopnea. The intake nurse noted blisters on the previously closed right leg 02/19/16; this is the patient's regular visit day. I see him on Friday with escalating edema new wounds on the right leg and clear signs of at least right ventricular  heart failure. I increased his Lasix to 40 twice a day. He is returning  currently in follow-up. States he is noticed a decrease in that the edema 02/26/16 patient's legs have much less edema. There is nothing really open on the right leg. The left leg has improved condition of the large superficial wound on the posterior left leg 03/04/16; edema control is very much better. The patient's right leg wounds have healed. On the left leg he continues to have severe venous inflammation on the posterior aspect of the left leg. There is no tenderness and I don't think any of this is cellulitis. 03/11/16; patient's right leg is married healed and he is in his own stocking. The patient's left leg has deteriorated somewhat. There is a lot of erythema around the wound on the posterior left leg. There is also a significant rim of erythema posteriorly just above where the wrap would've ended there is a new wound in this location and a lot of tenderness. Can't rule out cellulitis in this area. 03/15/16; patient's right leg remains healed and he is in his own stocking. The patient's left leg is much better than last review. His major wound on the posterior aspect of his left Is almost fully epithelialized. He has 3 small injuries from the wraps. Really. Erythema seems a lot better on antibiotics 03/18/16; right leg remains healed and he is in his own stocking. The patient's left leg is much better. The area on the posterior aspect of the left calf is fully epithelialized. His 3 small injuries which were wrap injuries on the left are improved only one seems still open his erythema has resolved 03/25/16; patient's right leg remains healed and he is in his own stocking. There is no open area today on the left leg posterior leg is completely closed up. His wrap injuries at the superior aspect of his leg are also resolved. He looks as though he has some irritation on the dorsal ankle but this is fully epithelialized without evidence of infection. 03/28/16; we discharged this patient on Monday.  Transitioned him into his own stocking. There were problems almost immediately with uncontrolled swelling weeping edema multiple some of which have opened. He does not feel systemically unwell in particular no chest pain no shortness of breath and he does not feel 04/08/16; the edema is under better control with the Profore light wrap but he still has pitting edema. There is one large wound anteriorly 2 on the medial aspect of his left leg and 3 small areas on the superior posterior calf. Drainage is not excessive he is tolerating a Profore light well 04/15/16; put a Profore wrap on him last week. This is controlled is edema however he had a lot of pain on his left anterior foot most of his wounds are healed 04/22/16 once again the patient has denuded areas on the left anterior foot which he states are because his wrap slips up word. He saw his primary physician today is on Lasix 40 twice a day and states that he his weight is down 20 pounds over the last 3 months. 04/29/16: Much improved. left anterior foot much improved. He is now on Lasix 80 mg per day. Much improved edema control 05/06/16; I was hoping to be able to discharge him today however once again he has blisters at a low level of where the compression was placed last week mostly on his left lateral but also his left medial leg and a small area on the  anterior part of the left foot. 05/09/16; apparently the patient went home after his appointment on 7/4 later in the evening developing pain in his upper medial thigh together with subjective fever and chills although his temperature was not taken. The pain was so intense he felt he would probably have to call 911. However he then remembered that he had leftover doxycycline from a previous round of antibiotics and took these. By the next morning he felt a lot better. He called and spoke to one of our nurses and I approved doxycycline over the phone thinking that this was in relation to the wounds we had  previously seen although they were definitely were not. The patient feels a lot better old fever no chills he is still working. Blood sugars are reasonably controlled 05/13/16; patient is back in for review of his cellulitis on his anterior medial upper thigh. He is taking doxycycline this is a lot better. Culture I did of the nodular area on the dorsal aspect of his foot grew MRSA this also looks a lot better. 05/20/16; the patient is cellulitis on the medial upper thigh has resolved. All of his wound areas including the left anterior foot, areas on the medial aspect of the left calf and the lateral aspect of the calf at all resolved. He has a new blister on the left dorsal foot at the level of the fourth toe this was excised. No evidence of infection 05/27/16; patient continues to complain weeping edema. He has new blisterlike wounds on the left anterior lateral and posterior lateral calf at the top of his wrap levels. The area on his left anterior foot appears better. He is not complaining of fever, pain or pruritus in his feet. 05/30/16; the patient's blisters on his left anterior leg posterior calf all look improved. He did not increase the Lasix 100 mg as I suggested because he was going to run out of his 40 mg tablets. He is still having weeping edema of his toes 06/03/16; I renewed his Lasix at 80 mg once a day as he was about to run out when I last saw him. He is on 80 mg of Lasix now. I have asked him to cut down on the excessive amount of water he was drinking and asked him to drink according to his thirst mechanisms 06/12/2016 -- was seen 2 days ago and was supposed to wear his compression stockings at home but he is developed lymphedema and superficial blisters on the left lower extremity and hence came in for a review 06/24/16; the remaining wound is on his left anterior leg. He still has edema coming from between his toes. There is lymphedema here however his edema is generally better than  when I last saw this. He has a history of atrial fibrillation but does not have a known history of congestive heart failure nevertheless I think he probably has this at least on a diastolic basis. 07/01/16 I reviewed his echocardiogram from January 2017. This was essentially normal. He did not have LVH, EF of 55-60%. His right ventricular function was normal although he did have trivial tricuspid and pulmonic regurgitation. This is not audible on exam however. I increased his Lasix to do massive edema in his legs well above his knees I think in early July. He was also drinking an excessive amount of water at the time. 07/15/16; missed his appointment last week because of the Labor Day holiday on Monday. He could not get another appointment later in  the week. Started to feel the wrap digging in superiorly so we remove the top half and the bottom half of his wrap. He has extensive erythema and blistering superiorly in the left leg. Very tender. Very swollen. Edema in his foot with leaking edema fluid. He has not been systemically unwell 07/22/16; the area on the left leg laterally required some debridement. The medial wounds look more stable. His wrap injury wounds appear to have healed. Edema and his foot is better, weeping edema is also better. He tells me he is meeting with the supplier of the external compression pumps at work 08/05/16; the patient was on vacation last week in [facility]. His wrap is been on for an extended period of time. Also over the weekend he developed an extensive area of tender erythema across his anterior medial thigh. He took to doxycycline yesterday that he had leftover from a previous prescription. The patient complains of weeping edema coming out of his toes 08/08/16; I saw this patient on 10/2. He was tender across his anterior thigh. I put him on doxycycline. He returns today in follow-up. He does not have any open wounds on his lower leg, he still has edema weeping into  his toes. 08/12/16; patient was seen back urgently today to follow-up for his extensive left thigh cellulitis/erysipelas. He comes back with a lot less swelling and erythema pain is much better. I believe I gave him Augmentin and Cipro. His wrap was cut down as he stated a roll down his legs. He developed blistering above the level of the wrap that remained. He has 2 open blisters and 1 intact. 08/19/16; patient is been doing his primary doctor who is increased his Lasix from 40-80 once a day or 80 already has less edema. Cellulitis has remained improved in the left thigh. 2 open areas on the posterior left calf 08/26/16; he returns today having new open blisters on the anterior part of his left leg. He has his compression pumps but is not yet been shown how to use some vital representative from the supplier. 09/02/16 patient returns today with no open wounds on the left leg. Some maceration in his plantar toes 09/10/2016 -- Dr. Leanord Hawking had recently discharged him on 09/02/2016 and he has come right back with redness swelling and some open ulcers on his left lower extremity. He says this was caused by trying to apply his compression stockings and he's been unable to use this and has not been able to use his lymphedema pumps. He had some doxycycline leftover and he has started on this a few days ago. 09/16/16; there are no open wounds on his leg on the left and no evidence of cellulitis. 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 He does not complain of symptoms here. I am not clear use using his external compression pumps. 09/23/16; I have not seen this patient in 2 weeks. He canceled his appointment 10 days ago as he was going on vacation. He tells me that on Monday he noticed a large area on his posterior left leg which is been draining copiously and is reopened into a large wound. He is been using ABDs and the external part of his juxtalite,  according to our nurse this was not on properly. 10/07/16; Still a substantial area on the posterior left leg. Using silver alginate 10/14/16; in general better although there is still open area which looks healthy. Still using silver alginate. He  reminds me that this happen before he left for [facility] T oday while he was showering in the morning. He had been using his juxtalite's 10/21/16; the area on his posterior left leg is fully epithelialized. However he arrives today with a large area of tender erythema in his medial and posterior left thigh just above the knee. I have marked the area. Once again he is reluctant to consider hospitalization. I treated him with oral antibiotics in the past for a similar situation with resolution I think with doxycycline however this area it seems more extensive to me. He is not complaining of fever but does have chills and says states he is thirsty. His blood sugar today was in the 140s at home 10/25/16 the area on his posterior left leg is fully epithelialized although there is still some weeping edema. The large area of tenderness and erythema in his medial and posterior left thigh is a lot less tender although there is still a lot of swelling in this thigh. He states he feels a lot better. He is on doxycycline and Augmentin that I started last week. This will continued until Tuesday, December 26. I have ordered a duplex ultrasound of the left thigh rule out DVT whether there is an abscess something that would need to be drained I would also like to know. 11/01/16; he still has weeping edema from a not fully epithelialized area on his left posterior calf. Most of the rest of this looks a lot better. He has completed his antibiotics. His thigh is a lot better. Duplex ultrasound did not show a DVT in the thigh 11/08/16; he comes in today with more Denuded surface epithelium from the posterior aspect of his calf. There is no real evidence of cellulitis. The  superior aspect of his wrap appears to have put quite an indentation in his leg just below the knee and this may have contributed. He does not complain of pain or fever. We have been using silver alginate as the primary dressing. The area of cellulitis in the right thigh has totally resolved. He has been using his compression stockings once a week 11/15/16; the patient arrives today with more loss of epithelium from the posterior aspect of his left calf. He now has a fairly substantial wound in this area. The reason behind this deterioration isn't exactly clear although his edema is not well controlled. He states he feels he is generally more swollen systemically. He is not complaining of chest pain shortness of breath fever. T me he has an appointment with his primary physician in early February. He is on 80 mg of oral ells Lasix a day. He claims compliance with the external compression pumps. He is not having any pain in his legs similar to what he has with his recurrent cellulitis 11/22/16; the patient arrives a follow-up of his large area on his left lateral calf. This looks somewhat better today. He came in earlier in the week for a dressing change since I saw him a week ago. He is not complaining of any pain no shortness of breath no chest pain 11/28/16; the patient arrives for follow-up of his large area on the left lateral calf this does not look better. In fact it is larger weeping edema. The surface of the wound does not look too bad. We have been using silver alginate although I'm not certain that this is a dressing issue. 12/05/16; again the patient follows up for a large wound on the left lateral  and left posterior calf this does not look better. There continues to be weeping edema necrotic surface tissue. More worrisome than this once again there is erythema below the wound involving the distal Achilles and heel suggestive of cellulitis. He is on his feet working most of the day of this is  not going well. We are changing his dressing twice a week to facilitate the drainage. 12/12/16; not much change in the overall dimensions of the large area on the left posterior calf. This is very inflamed looking. I gave him an. Doxycycline last week does not really seem to have helped. He found the wrap very painful indeed it seems to of dog into his legs superiorly and perhaps around the heel. He came in early today because the drainage had soaked through his dressings. 12/19/16- patient arrives for follow-up evaluation of his left lower extremity ulcers. He states that he is using his lymphedema pumps once daily when there is "no drainage". He admits to not using his lipedema pumps while under current treatment. His blood sugars have been consistently between 150-200. 12/26/16; the patient is not using his compression pumps at home because of the wetness on his feet. I've advised him that I think it's important for him to use this daily. He finds his feet too wet, he can put a plastic bag over his legs while he is in the pumps. Otherwise I think will be in a vicious circle. We are using silver alginate to the major area on his left posterior calf 01/02/17; the patient's posterior left leg has further of all into 3 open wounds. All of them covered with a necrotic surface. He claims to be using his compression pumps once a day. His edema control is marginal. Continue with silver alginate 01/10/17; the patient's left posterior leg actually looks somewhat better. There is less edema, less erythema. Still has 3 open areas covered with a necrotic surface requiring debridement. He claims to be using his compression pumps once a day his edema control is better 01/17/17; the patient's left posterior calf look better last week when I saw him and his wrap was changed 2 days ago. He has noted increasing pain in the left heel and arrives today with much larger wounds extensive erythema extending down into the entire  heel area especially tender medially. He is not systemically unwell CBGs have been controlled no fever. Our intake nurse showed me limegreen drainage on his AVD pads. 01/24/17; his usual this patient responds nicely to antibiotics last week giving him Levaquin for presumed Pseudomonas. The whole entire posterior part of his leg is much better much less inflamed and in the case of his Achilles heel area much less tender. He has also had some epithelialization posteriorly there are still open areas here and still draining but overall considerably better 01/31/17- He has continue to tolerate the compression wraps. he states that he continues to use the lymphedema pumps daily, and can increase to twice daily on the weekends. He is voicing no complaints or concerns regarding his LLE ulcers 02/07/17-he is here for follow-up evaluation. He states that he noted some erythema to the left medial and anterior thigh, which he states is new as of yesterday. He is concerned about recurrent cellulitis. He states his blood sugars have been slightly elevated, this morning in the 180s 02/14/17; he is here for follow-up evaluation. When he was last here there was erythema superiorly from his posterior wound in his anterior thigh. He was prescribed Levaquin  however a culture of the wound surface grew MRSA over the phone I changed him to doxycycline on Monday and things seem to be a lot better. 02/24/17; patient missed his appointment on Friday therefore we changed his nurse visit into a physician visit today. Still using silver alginate on the large area of the posterior left thigh. He isn't new area on the dorsal left second toe 03/03/17; actually better today although he admits he has not used his external compression pumps in the last 2 days or so because of work responsibilities over the weekend. 03/10/17; continued improvement. External compression pumps once a day almost all of his wounds have closed on the posterior left  calf. Better edema control 03/17/17; in general improved. He still has 3 small open areas on the lateral aspect of his left leg however most of the area on the posterior part of his leg is epithelialized. He has better edema control. He has an ABD pad under his stocking on the right anterior lower leg although he did not let us look at that today. 03/24/17; patient arrives back in clinic today with no open areas however there are areas on the posterior left calf and anterior left calf that are less than 100% epithelialized. His edema is well controlled in the left lower leg. There is some pitting edema probably lymphedema in the left upper thigh. He uses compression pumps at home once per day. I tried to get him to do this twice a day although he is very reticent. 04/01/2017 -- for the last 2 days he's had significant redness, tenderness and weeping and came in for an urgent visit today. 04/07/17; patient still has 6 more days of doxycycline. He was seen by Dr. Meyer Russel last Wednesday for cellulitis involving the posterior aspect, lateral aspect of his Involving his heel. For the most part he is better there is less erythema and less weeping. He has been on his feet for 12 hours 2 over the weekend. Using his compression pumps once a day 04/14/17 arrives today with continued improvement. Only one area on the posterior left calf that is not fully epithelialized. He has intense bilateral venous inflammation associated with his chronic venous insufficiency disease and secondary lymphedema. We have been using silver alginate to the left posterior calf wound In passing he tells [place] today that the right leg but we have not seen in quite some time has an open area on it but he doesn't want [place] to look at this today states he will show this to [place] next week. 04/21/17; there is no open area on his left leg although he still reports some weeping edema. He showed [place] his right leg today which is the first time we've  seen this leg in a long time. He has a large area of open wound on the right leg anteriorly healthy granulation. Quite a bit of swelling in the right leg and some degree of venous inflammation. He told us about the right leg in passing last week but states that deterioration in the right leg really only happened over the weekend 04/28/17; there is no open area on the left leg although there is an irritated part on the posterior which is like a wrap injury. The wound on the right leg which was new from last week at least to [place] is a lot better. 05/05/17; still no open area on the left leg. Patient is using his new compression stocking which seems to be doing a good job  of controlling the edema. He states he is using his compression pumps once per day. The right leg still has an open wound although it is better in terms of surface area. Required debridement. A lot of pain in the posterior right Achilles marked tenderness. Usually this type of presentation this patient gives concern for an active cellulitis 05/12/17; patient arrives today with his major wound from last week on the right lateral leg somewhat better. Still requiring debridement. He was using his compression stocking on the left leg however that is reopened with superficial wounds anteriorly he did not have an open wound on this leg previously. He is still using his juxta light's once daily at night. He cannot find the time to do this in the morning as he has to be at work by 7 AM 05/19/17; right lateral leg wound looks improved. No debridement required. The concerning area is on the left posterior leg which appears to almost have a subcutaneous hemorrhagic component to it. We've been using silver alginate to all the wounds 05/26/17; the right lateral leg wound continues to look improved. However the area on the left posterior calf is a tightly adherent surface. [place] using silver alginate. Because of the weeping edema in his legs there is very  little good alternatives. 06/02/17; the patient left here last week looking quite good. Major wound on the left posterior calf and a small one on the right lateral calf. Both of these look satisfactory. He tells me that by Wednesday he had noted increased pain in the left leg and drainage. He called on Thursday and Friday to get an appointment here but we were blocked. He did not go to urgent care or his primary physician. He thinks he had a fever on Thursday but did not actually take his temperature. He has not been using his compression pumps on the left leg because of pain. I advised him to go to the emergency room today for IV antibiotics for stents of left leg cellulitis but he has refused I have asked him to take 2 days off work to keep his leg elevated and he has refused this as well. In view of this I'm going to call him and Augmentin and doxycycline. 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 However after a detailed discussion regarding the risk benefits and alternatives he refuses to get admitted to the hospital. With no other recourse I will continue him on oral antibiotics as before and hopefully he'll have his infectious disease consultation this week. 06/16/2017 -- the patient was seen today by the nurse practitioner at infectious disease Ms. Dixon. 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 He has also been advise Lotrimin cream locally. she also had wise IV antibiotics for his condition if it flares up 06/23/17; patient arrives today with drainage bilaterally although  the remaining wound on the left posterior calf after cleaning up today "highlighter yellow drainage" did not look too bad. Unfortunately he has had breakdown on the right anterior leg [previously this leg had not been open and he  is using a black stocking] he went to see infectious disease and is been put on clindamycin 150 mg daily, I did not verify the dose although I'm not familiar with using clindamycin in this dosing range, perhaps for prophylaxisoo 06/27/17; I brought this patient back today to follow-up on the wound deterioration on the right lower leg together with surrounding cellulitis. I started him on doxycycline 4 days ago. This area looks better however he comes in today with intense cellulitis on the medial part of his left thigh. This is not have a wound in this area. Extremely tender. We've been using silver alginate to the wounds on the right lower leg left lower leg with bilateral 4 layer compression he is using his external compression pumps once a day 07/04/17; patient's left medial thigh cellulitis looks better. He has not been using his compression pumps as his insert said it was contraindicated with cellulitis. His right leg continues to make improvements all the wounds are still open. We only have one remaining wound on the left posterior calf. Using silver alginate to all open areas. He is on doxycycline which I started a week ago and should be finishing I gave him Augmentin after Thursday's visit for the severe cellulitis on the left medial thigh which fortunately looks better 07/14/17; the patient's left medial thigh cellulitis has resolved. The cellulitis in his right lower calf on the right also looks better. All of his wounds are stable to improved we've been using silver alginate he has completed the antibiotics I have given him. He has clindamycin 150 mg once a day prescribed by infectious disease for prophylaxis, I've advised him to start this now. We have been using  bilateral Unna boots over silver alginate to the wound areas 07/21/17; the patient is been to see infectious disease who noted his recurrent problems with cellulitis. He was not able to tolerate prophylactic clindamycin therefore he is on amoxicillin 500 twice a day. He also had a second daily dose of Lasix added By Dr. Oneta Rack but he is not taking this. Nor is he being completely compliant with his compression pumps a especially not this week. He has 2 remaining wounds one on the right posterior lateral lower leg and one on the left posterior medial lower leg. 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 The patient has Unna boots bilaterally. 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 He has bilateral Unna boots for compression and silver alginate to his wounds. Arrives today with his legs looking as good as I have seen him in quite some time. Not surprisingly his wounds look better as well with improvement on the right lateral leg venous insufficiency wound and also the left medial leg. He is still using the compression pumps once a day 08/11/17; both legs appear to be doing better wounds on the right lateral and left medial legs look better. Skin on the right leg quite good. He is been using silver alginate as the primary dressing. I'm going to use Anasept gel calcium alginate and maintain all the secondary dressings 08/18/17; the patient continues to actually do quite well. The area on his right lateral leg is just about closed the left medial also looks better although it is still moist in this area. His edema is well  controlled we have been using Anasept gel with calcium alginate and the usual secondary dressings, 4 layer compression and once daily use of his compression pumps  "always been able to manage 09/01/17; the patient continues to do reasonably well in spite of his trip to [place]. The area on the right lateral leg is epithelialized. Left is much better but still open. He has more edema and more chronic erythema on the left leg [venous inflammation] 09/08/17; he arrives today with no open wound on the right lateral leg and decently controlled edema. Unfortunately his left leg is not nearly as in his good situation as last week.he apparently had increasing edema starting on Saturday. He edema soaked through into his foot so used a plastic bag to walk around his home. The area on the medial right leg which was his open area is about the same however he has lost surface epithelium on the left lateral which is new and he has significant pain in the Achilles area of the left foot. He is already on amoxicillin chronically for prophylaxis of cellulitis in the left leg 09/15/17; he is completed a week of doxycycline and the cellulitis in the left posterior leg and Achilles area is as usual improved. He still has a lot of edema and fluid soaking through his dressings. There is no open wound on the right leg. He saw infectious disease NP today 09/22/17;As usual 1 we transition him from our compression wraps to his stockings things did not go well. He has several small open areas on the right leg. He states this was caused by the compression wrap on his skin although he did not wear this with the stockings over them. He has several superficial areas on the left leg medially laterally posteriorly. He does not have any evidence of active cellulitis especially involving the left Achilles The patient is traveling from [facility] Saturday going to [facility]. He states he isn't attempting to get an appointment with a heel objects wound center there to change his dressings. I am not completely certain whether this will work 10/06/17; the patient came in on Friday for a nurse visit  and the nurse reported that his legs actually look quite good. He arrives in clinic today for his regular follow-up visit. He has a new wound on his left third toe over the PIP probably caused by friction with his footwear. He has small areas on the left leg and a very superficial but epithelialized area on the right anterior lateral lower leg. Other than that his legs look as good as I've seen him in quite some time. We have been using silver alginate Review of systems; no chest pain no shortness of breath other than this a 10 point review of systems negative 10/20/17; seen by Dr. Meyer Russel last week. He had taken some antibiotics [doxycycline] that he had left over. Dr. Meyer Russel thought he had candida infection and declined to give him further antibiotics. He has a small wound remaining on the right lateral leg several areas on the left leg including a larger area on the left posterior several left medial and anterior and a small wound on the left lateral. The area on the left dorsal third toe looks a lot better. ROS; Gen.; no fever, respiratory no cough no sputum Cardiac no chest pain other than this 10 point review of system is negative 10/30/17; patient arrives today having fallen in the bathtub 3 days ago. It took him a while to get up.  He has pain and maceration in the wounds on his left leg which have deteriorated. He has not been using his pumps he also has some maceration on the right lateral leg. 11/03/17; patient continues to have weeping edema especially in the left leg. This saturates his dressings which were just put on on 12/27. As usual the doxycycline seems to take care of the cellulitis on his lower leg. He is not complaining of fever, chills, or other systemic symptoms. He states his leg feels a lot better on the doxycycline I gave him empirically. He also apparently gets injections at his primary doctor's officeo Rocephin for cellulitis prophylaxis. I didn't ask him about his  compression pump compliance today I think that's probably marginal. Arrives in the clinic with all of his dressings primary and secondary macerated full of fluid and he has bilateral edema 11/10/17; the patient's right leg looks some better although there is still a cluster of wounds on the right lateral. The left leg is inflamed with almost circumferential skin loss medially to laterally although we are still maintaining anteriorly. He does not have overt cellulitis there is a lot of drainage. He is not using compression pumps. We have been using silver alginate to the wound areas, there are not a lot of options here 11/17/17; the patient's right leg continues to be stable although there is still open wounds, better than last week. The inflammation in the left leg is better. Still loss of surface layer epithelium especially posteriorly. There is no overt cellulitis in the amount of edema and his left leg is really quite good, tells me he is using his compression pumps once a day. 11/24/17; patient's right leg has a small superficial wound laterally this continues to improve. The inflammation in the left leg is still improving however we have continuous surface layer epithelial loss posteriorly. There is no overt cellulitis in the amount of edema in both legs is really quite good. He states he is using his compression pumps on the left leg once a day for 5 out of 7 days 12/01/17; very small superficial areas on the right lateral leg continue to improve. Edema control in both legs is better today. He has continued loss of surface epithelialization and left posterior calf although I think this is better. We have been using silver alginate with large number of absorptive secondary dressings 4 layer on the left Unna boot on the right at his request. He tells me he is using his compression pumps once a day 12/08/17; he has no open area on the right leg is edema control is good here. On the left leg however he has  marked erythema and tenderness breakdown of skin. He has what appears to be a wrap injury just distal to the popliteal fossa. 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 There was swelling erythema and breakdown in the right leg. He has much worse skin breakdown in the left leg as well multiple open areas medially and posteriorly as well as laterally. He tells me he has been using his compression pumps but tells me he feels that the drainage out of his leg is worse when he uses a compression pumps. T be fair to him he  is been saying this o for a while however I don't know that I have really been listening to this. I wonder if the compression pumps are working properly 12/22/17;. Once again he arrives with severe erythema, weeping edema from the left greater than right leg. Noncompliance with compression pumps. New this visit he is complaining of pain on the lateral aspect of the right leg and the medial aspect of his right thigh. He apparently saw his cardiologist Dr. [name] who was ordered an echocardiogram area and I think this is a step in the right direction 12/25/17; started his doxycycline Monday night. There is still intense erythema of the right leg especially in the anterior thigh although there is less tenderness. The erythema around the wound on the right lateral calf also is less tender. He still complaining of pain in the left heel. His wounds are about the same right lateral left medial left lateral. Superficial but certainly not close to closure. He denies being systemically unwell no fever chills no abdominal pain no diarrhea 12/29/17; back in follow-up of his extensive right calf and right thigh cellulitis. I added  amoxicillin to cover possible doxycycline resistant strep. This seems to of done the trick he is in much less pain there is much less erythema and swelling. He has his echocardiogram at 11:00 this morning. X-ray of the left heel was also negative. 01/05/18; the patient arrived with his edema under much better control. Now that he is retired he is able to use his compression pumps daily and sometimes twice a day per the patient. He has a wound on the right leg the lateral wound looks better. Area on the left leg also looks a lot better. He has no evidence of cellulitis in his bilateral thighs I had a quick peak at his echocardiogram. He is in normal ejection fraction and normal left ventricular function. He has moderate pulmonary hypertension moderately reduced right ventricular function. One would have to wonder about chronic sleep apnea although he says he doesn't snore. He'll review the echocardiogram with his cardiologist. 01/12/18; the patient arrives with the edema in both legs under exemplary control. He is using his compression pumps daily and sometimes twice daily. His wound on the right lateral leg is just about closed. He still has some weeping areas on the posterior left calf and lateral left calf although everything is just about closed here as well. I have spoken with Aldean Baker who is the patient's nurse practitioner and infectious disease. She was concerned that the patient had not understood that the parenteral penicillin injections he was receiving for cellulitis prophylaxis was actually benefiting him. I don't think the patient actually saw that I would tend to agree we were certainly dealing with less infections although he had a serious one last month. 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 He is healed. He'll be placed in juxtalite compression wraps and increase his lymphedema pumps to twice daily. We will follow up again next week to  ensure there are no issues with the new regiment. 01/20/18-he is here for evaluation of bilateral lower extremity weeping edema. Yesterday he was placed in compression wrap to the right lower extremity and compression stocking to left lower shrubbery. He states he uses lymphedema pumps last night and again this morning and noted a blister to the left lower extremity. On exam he was noted to have drainage to the right lower extremity. He will be placed in Unna boots bilaterally  and follow-up next week 01/26/18; patient was actually discharged a week ago to his own juxta light stockings only to return the next day with bilateral lower extremity weeping edema.he was placed in bilateral Unna boots. He arrives today with pain in the back of his left leg. There is no open area on the right leg however there is a linear/wrap injury on the left leg and weeping edema on the left leg posteriorly. I spoke with infectious disease about 10 days ago. They were disappointed that the patient elected to discontinue prophylactic intramuscular penicillin shots as they felt it was particularly beneficial in reducing the frequency of his cellulitis. I discussed this with the patient today. He does not share this view. He'll definitely need antibiotics today. Finally he is traveling to [place] and trauma leaving this Saturday and returning a week later and he does not travel with his pumps. He is going by car 01/30/18; patient was seen 4 days ago and brought back in today for review of cellulitis in the left leg posteriorly. I put him on amoxicillin this really hasn't helped as much as I might like. He is also worried because he is traveling to [facility] by car. Finally we will be rewrapping him. There is no open area on the right leg over his left leg has multiple weeping areas as usual 02/09/18; The same wrap on for 10 days. He did not pick up the last doxycycline I prescribed for him. He apparently took 4 days worth  he already had. There is nothing open on his right leg and the edema control is really quite good. He's had damage in the left leg medially and laterally especially probably related to the prolonged use of Unna boots 02/12/18; the patient arrived in clinic today for a nurse visit/wrap change. He complained of a lot of pain in the left posterior calf. He is taking doxycycline that I previously prescribed for him. Unfortunately even though he used his stockings and apparently used to compression pumps twice a day he has weeping edema coming out of the lateral part of his right leg. This is coming from the lower anterior lateral skin area. 02/16/18; the patient has finished his doxycycline and will finish the amoxicillin 2 days. The area of cellulitis in the left calf posteriorly has resolved. He is no longer having any pain. He tells me he is using his compression pumps at least once a day sometimes twice. 02/23/18; the patient finished his doxycycline and Amoxil last week. On Friday he noticed a small erythematous circle about the size of a quarter on the left lower leg just above his ankle. This rapidly expanded and he now has erythema on the lateral and posterior part of the thigh. This is bright red. Also has an area on the dorsal foot just above his toes and a tender area just below the left popliteal fossa. He came off his prophylactic penicillin injections at his own insistence one or 2 months ago. This is obviously deteriorated since then 03/02/18; patient is on doxycycline and Amoxil. Culture I did last week of the weeping area on the back of his left calf grew group B strep. I have therefore renewed the amoxicillin 500 3 times a day for a further week. He has not been systemically unwell. Still complaining of an area of discomfort right under his left popliteal fossa. There is no open wound on the right leg. He tells me that he is using his pumps twice a day  on most days 03/09/18; patient arrives in  clinic today completing his amoxicillin today. The cellulitis on his left leg is better. Furthermore he tells me that he had intramuscular penicillin shots that his primary care office today. However he also states that the wrap on his right leg fell down shortly after leaving clinic last week. He developed a large blister that was present when he came in for a nurse visit later in the week and then he developed intense discomfort around this area.He tells me he is using his compression pumps 03/16/18; the patient has completed his doxycycline. The infectious part of this/cellulitis in the left heel area left popliteal area is a lot better. He has 2 open areas on the right calf. Still areas on the left calf but this is a lot better as well. 03/24/18; the patient arrives complaining of pain in the left popliteal area again. He thinks some of this is wrap injury. He has no open area on the right leg and really no open area on the left calf either except for the popliteal area. He claims to be compliant with the compression pumps 03/31/18; I gave him doxycycline last week because of cellulitis in the left popliteal area. This is a lot better although the surface epithelium is denuded off and response to this. He arrives today with uncontrolled edema in the right calf area as well as a fingernail injury in the right lateral calf. There is only a few open areas on the left 04/06/18; I gave him amoxicillin doxycycline over the last 2 weeks that the amoxicillin should be completing currently. He is not complaining of any pain or systemic symptoms. The only open areas see has is on the right lateral lower leg paradoxically I cannot see anything on the left lower leg. He tells me he is using his compression pumps twice a day on most days. Silver alginate to the wounds that are open under 4 layer compression 04/13/18; he completed antibiotics and has no new complaints. Using his compression pumps. Silver alginate that  anything that's opened 04/20/18; he is using his compression pumps religiously. Silver alginate 4 layer compression anything that's opened. He comes in today with no open wounds on the left leg but 3 on the right including a new one posteriorly. He has 2 on the right lateral and one on the right posterior. He likes Unna boots on the right leg for reasons that aren't really clear we had the usual 4 layer compression on the left. It may be necessary to move to the 4 layer compression on the right however for now I left them in the Unna boots 04/27/18; he is using his compression pumps at least once a day. He has still the wounds on the right lateral calf. The area right posteriorly has closed. He does not have an open wound on the left under 4 layer compression however on the dorsal left foot just proximal to the toes and the left third toe 2 small open areas were identified 05/11/18; he has not uses compression pumps. The areas on the right lateral calf have coalesced into one large wound necrotic surface. On the left side he has one small wound anteriorly however the edema is now weeping out of a large part of his left leg. He says he wasn't using his pumps because of the weeping fluid. I explained to him that this is the time he needs to pump more 05/18/18; patient states he is using his compression  pumps twice a day. The area on the right lateral large wound albeit superficial. On the left side he has innumerable number of small new wounds on the left calf particularly laterally but several anteriorly and medially. All these appear to have healthy granulated base these look like the remnants of blisters however they occurred under compression. The patient arrives in clinic today with his legs somewhat better. There is certainly less edema, less multiple open areas on the left calf and the right anterior leg looks somewhat better as well superficial and a little smaller. However he relates pain and  erythema over the last 3-4 days in the thigh and I looked at this today. He has not been systemically unwell no fever no chills no change in blood sugar values 05/25/18; comes in today in a better state. The severe cellulitis on his left leg seems better with the Keflex. Not as tender. He has not been systemically unwell Hard to find an open wound on the left lower leg using his compression pumps twice a day The confluent wounds on his right lateral calf somewhat better looking. These will ultimately need debridement I didn't do this today. 06/01/18; the severe cellulitis on the left anterior thigh has resolved and he is completed his Keflex. There is no open wound on the left leg however there is a superficial excoriation at the base of the third toe dorsally. Skin on the bottom of his left foot is macerated looking. The left the wounds on the lateral right leg actually looks some better although he did require debridement of the top half of this wound area with an open curet 06/09/18 on evaluation today patient appears to be doing poorly in regard to his right lower extremity in particular this appears to likely be infected he has very thick purulent discharge along with a bright green tent to the discharge. This makes me concerned about the possibility of pseudomonas. He's also having increased discomfort at this point on evaluation. Fortunately there does not appear to be any evidence of infection spreading to the other location at this time. 06/16/18 on evaluation today patient appears to actually be doing fairly well. His ulcer has actually diminished in size quite significantly at this point which is good news. Nonetheless he still does have some evidence of infection he did see infectious disease this morning before coming here for his appointment. I did review the results of their evaluation and their note today. They did actually have him discontinue the Cipro and initiate treatment with  linezolid at this time. He is doing this for the next seven days and they recommended a follow-up in four months with them. He is the keep a log of the need for intermittent antibiotic therapy between now and when he falls back up with infectious disease. This will help them gaze what exactly they need to do to try and help them out. 06/23/18; the patient arrives today with no open wounds on the left leg and left third toe healed. He is been using his compression pumps twice a day. On the right lateral leg he still has a sizable wound but this is a lot better than last time I saw this. 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 Has been using silver alginate under 4 layer compression 06/30/18; the only open wound he has is on the right lateral leg and this looks  healthy. No debridement is required. We have been using silver alginate. He does not have an open wound on the left leg. There is apparently some drainage from the dorsal proximal third toe on the left although I see no open wound here. 07/03/18 on evaluation today patient was actually here just for a nurse visit rapid change. However when he was here on Wednesday for his rat change due to having been healed on the left and then developing blisters we initiated the wrap again knowing that he would be back today for [place] to reevaluate and see were at. Unfortunately he has developed some cellulitis into the proximal portion of his right lower extremity even into the region of his thigh. 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 He was placed on one as what at that point. Nonetheless he is done with that and has been tolerating it well otherwise. Doxycycline which in the past really did not seem to be effective for him. Nonetheless I think the best option may be for [place] to definitely reinitiate the  antibiotics for a longer period of time. 07/07/18; since I last saw this patient a week ago he has had a difficult time. At that point he did not have an open wound on his left leg. We transitioned him into juxta light stockings. He was apparently in the clinic the next day with blisters on the left lateral and left medial lower calf. He also had weeping edema fluid. He was put back into a compression wrap. He was also in the clinic on Friday with intense erythema in his right thigh. Per the patient he was started on Bactrim however that didn't work at all in terms of relieving his pain and swelling. He has taken 3 doxycycline that he had left over from last time and that seems to of helped. He has blistering on the right thigh as well. 07/14/18; the erythema on his right thigh has gotten better with doxycycline that he is finishing. 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 Presumably this cellulitis in the thigh was not related to that although I think this is a bit concerning going forward. He still has an area on the right lateral calf the blister on the right medial calf just below the knee that was discussed above. On the left 2 small open areas left medial and left lateral. Edema control is adequate. He is using his compression pumps twice a day 07/20/18; continued improvement in the condition of both legs especially the edema in his bilateral thighs. He tells me he is been losing weight through a combination of diet and exercise. He is using his compression pumps twice a day. So overall she made to the remaining wounds 07/27/2018; continued improvement in condition of both legs. His edema is well controlled. The area on the right lateral leg is just about closed he had one blisters show up on the medial left upper calf. We have him in 4 layer compression. He is going on a 10-day trip to [place] and [place].  He will be driving. He wants to wear Unna boots because of the lessening amount of constriction. He will not use compression pumps while he is away 08/05/18 on evaluation today patient actually appears to be doing decently well all things considered in regard to his bilateral lower extremities. The worst  ulcer is actually only posterior aspect of his left lower extremity with a four layer compression wrap cut into his leg a couple weeks back. He did have a trip and actually had [employer] for the trip that he is worn since he was last here. Nonetheless he feels like the [employer] actually do better for him his swelling is up a little bit but he also with his trip was not taking his Lasix on a regular set schedule like he was supposed to be. He states that obviously the reason being that he cannot drive and keep going without having to urinate too frequently which makes it difficult. He did not have his pumps with him while he was away either which I think also maybe playing a role here too. 08/13/2018; the patient only has a small open wound on the right lateral calf which is a big improvement in the last month or 2. He also has the area posteriorly just below the posterior fossa on the left which I think was a wrap injury from several weeks ago. He has no current evidence of cellulitis. He tells me he is back into his compression pumps twice a day. He also tells me that while he was at the laundromat somebody stole a section of his extremitease stockings 08/20/2018; back in the clinic with a much improved state. He only has small areas on the right lateral mid calf which is just about healed. This was is more substantial area for quite a prolonged period of time. He has a small open area on the left anterior tibia. The area on the posterior calf just below the popliteal fossa is closed today. He is using his compression pumps twice a day 08/28/2018; patient has no open wound on the right  leg. He has a smattering of open areas on the calf with some weeping lymphedema. More problematically than that it looks as though his wraps of slipped down in his usual he has very angry upper area of edema just below the right medial knee and on the right lateral calf. He has no open area on his feet. The patient is traveling to [facility] next week. I will send him in an antibiotic. We will continue to wrap the right leg. We ordered extremitease stockings for him last week and I plan to transition the right leg to a stocking when he gets home which will be in 10 days time. As usual he is very reluctant to take his pumps with him when he travels 09/07/2018; patient returns from [facility]. He shows me a picture of his left leg in the mid part of his trip last week with intense fire engine erythema. The picture look bad enough I would have considered sending him to the hospital. Instead he went to the wound care center in [facility]. They did not prescribe him antibiotics but he did take some doxycycline he had leftover from a previous visit. I had given him trimethoprim sulfamethoxazole before he left this did not work according to the patient. This is resulted in some improvement fortunately. He comes back with a large wound on the left posterior calf. Smaller area on the left anterior tibia. Denuded blisters on the dorsal left foot over his toes. Does not have much in the way of wounds on the right leg although he does have a very tender area on the right posterior area just below the popliteal fossa also suggestive of infection. He  promises me he is back on his pumps twice a day 09/15/2018; the intense cellulitis in his left lower calf is a lot better. The wound area on the posterior left calf is also so better. However he has reasonably extensive wounds on the dorsal aspect of his second and third toes and the proximal foot just at the base of the toes. There is nothing open on the  right leg 09/22/2018; the patient has excellent edema control in his legs bilaterally. He is using his external compression pumps twice a day. He has no open area on the right leg and only the areas in the left foot dorsally second and third toe area on the left side. He does not have any signs of active cellulitis. 10/06/2018; the patient has good edema control bilaterally. He has no open wound on the right leg. There is a blister in the posterior aspect of his left calf that we had to deal with today. He is using his compression pumps twice a day. There is no signs of active cellulitis. We have been using silver alginate to the wound areas. He still has vulnerable areas on the base of his left first second toes dorsally He has a his extremities stockings and we are going to transition him today into the stocking on the right leg. He is cautioned that he will need to continue to use the compression pumps twice a day. If he notices uncontrolled edema in the right leg he may need to go to 3 times a day. 10/13/2018; the patient came in for a nurse check on Friday he has a large flaccid blister on the right medial calf just below the knee. We unroofed this. He has this and a new area underneath the posterior mid calf which was undoubtedly a blister as well. He also has several small areas on the right which is the area we put his extremities stocking on. 10/19/2018; the patient went to see infectious disease this morning I am not sure if that was a routine follow-up in any case the doxycycline I had given him was discontinued and started on linezolid. He has not started this. It is easy to look at his left calf and the inflammation and think this is cellulitis however he is very tender in the tissue just below the popliteal fossa and I have no doubt that there is infection going on here. He states the problem he is having is that with the compression pumps the edema goes down and then starts walking the  wrap falls down. We will see if we can adhere this. He has 1 or 2 minuscule open areas on the right still areas that are weeping on the posterior left calf, the base of his left second and third toes 10/26/18; back today in clinic with quite of skin breakdown in his left anterior leg. This may have been infection the area below the popliteal fossa seems a lot better however tremendous epithelial loss on the left anterior mid tibia area over quite inexpensive tissue. He has 2 blisters on the right side but no other open wound here. 10/29/2018; came in urgently to see [place] today and we worked him in for review. He states that the 4 layer compression on the right leg caused pain he had to cut it down to roughly his mid calf this caused swelling above the wrap and he has blisters and skin breakdown today. As a result of the pain he has not been using his  pumps. Both legs are a lot more edematous and there is a lot of weeping fluid. 11/02/18; arrives in clinic with continued difficulties in the right leg> left. Leg is swollen and painful. multiple skin blisters and new open areas especially laterally. He has not been using his pumps on the right leg. 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 He is not systemically unwell. 11/09/2018; the patient claims he is being compliant with his pumps. He is finished the doxycycline I gave him last week. Culture I did of the wound on the right lateral leg showed a few very resistant methicillin staph aureus. This was resistant to doxycycline. Nevertheless he states the pain in the leg is a lot better which makes me wonder if the cultured organism was not really what was causing the problem nevertheless this is a very dangerous organism to be culturing out of any wound. His right leg is still a lot larger than the left. He is using an Radio broadcast assistant on this area, he blames a 4-layer compression for causing the original skin  breakdown which I doubt is true however I cannot talk him out of it. We have been using silver alginate to all of these areas which were initially blisters 11/16/2018; patient is being compliant with his external compression pumps at twice a day. Miraculously he arrives in clinic today with absolutely no open wounds. He has better edema control on the left where he has been using 4 layer compression versus wound of wounds on the right and I pointed this out to him. There is no inflammation in the skin in his lower legs which is also somewhat unusual for him. There is no open wounds on the dorsal left foot. He has extremitease stockings at home and I have asked him to bring these in next week. 11/25/18 patient's lower extremity on examination today on the left appears for the most part to be wound free. He does have an open wound on the lateral aspect of the right lower extremity but this is minimal compared to what I've seen in past. He does request that we go ahead and wrap the left leg as well even though there's nothing open just so hopefully it will not reopen in short order. 1/28; patient has superficial open wounds on the right lateral calf left anterior calf and left posterior calf. His edema control is adequate. 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 We have been using silver alginate as the primary dressing. He claims compliance with his compression pumps 2/4; patient has superficial open wounds on numerous areas of his left calf and again one on the left dorsal foot. The areas on the right lateral calf have healed. The cellulitis that I gave him doxycycline for last week is also resolved this was mostly on the left anterior calf just below the tibial tuberosity. His edema looks fairly well-controlled. He tells me he went to see his primary doctor today and had blood work ordered 2/11; once again he has  several open areas on the left calf left tibial area. Most of these are small and appear to have healthy granulation. He does not have anything open on the right. The edema and control in his thighs is pretty good which is usually a good indication he has been using his pumps as requested. 2/18; he continues to have several small areas  on the left calf and left tibial area. Most of these are small healthy granulation. We put him in his stocking on the right leg last week and he arrives with a superficial open area over the right upper tibia and a fairly large area on the right lateral tibia in similar condition. His edema control actually does not look too bad, he claims to be using his compression pumps twice a day 2/25. Continued small areas on the left calf and left tibial area. New areas especially on the right are identified just below the tibial tuberosity and on the right upper tibia itself. There are also areas of weeping edema fluid even without an obvious wound. He does not have a considerable degree of lymphedema but clearly there is more edema here than his skin can handle. He states he is using the pumps twice a day. We have an Unna boot on the right and 4 layer compression on the left. 3/3; he continues to have an area on the right lateral calf and right posterior calf just below the popliteal fossa. There is a fair amount of tenderness around the wound on the popliteal fossa but I did not see any evidence of cellulitis, could just be that the wrap came down and rubbed in this area. He does not have an open area on the left leg however there is an area on the left dorsal foot at the base of the third toe We have been using silver alginate to all wound areas 3/10; he did not have an open area on his left leg last time he was here a week ago. T oday he arrives with a horizontal wound just below the tibial tuberosity and an area on the left lateral calf. He has intense erythema and  tenderness in this area. The area is on the right lateral calf and right posterior calf better than last week. We have been using silver alginate as usual 3/18 - Patient returns with 3 small open areas on left calf, and 1 small open area on right calf, the skin looks ok with no significant erythema, he continues the UNA boot on right and 4 layer compression on left. The right lateral calf wound is closed , the right posterior is small area. we will continue silver alginate to the areas. 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 He is been tolerating the dressing changes without complication. Fortunately she has made excellent improvement in regard to the overall status of his wounds. Unfortunately every time we cease wrapping him he ends up reopening in causing more significant issues at that point. Again I'm unsure of the best direction to take although I think the lymphedema clinic may be appropriate for him. 02/03/19 on evaluation today patient appears to be doing well in regard to the wounds that we saw him for last week unfortunately he has a new area on the proximal portion of his right medial/posterior lower extremity where the wrap somewhat slowed down and caused swelling and a blister to rub and open. Unfortunately this is the only opening that he has on either leg at this point. 02/17/19 on evaluation today patient's bilateral lower extremities appear to be doing well. He still completely healed in regard to the left lower extremity. In regard to the right lower extremity the  area where the wrap and slid down and caused the blister still seems to be slightly open although this is dramatically better than during the last evaluation two weeks ago. I'm very pleased with the way this stands  overall. 03/03/19 on evaluation today patient appears to be doing well in regard to his right lower extremity in general although he did have a new blister open this does not appear to be showing any evidence of active infection at this time. Fortunately there's No fevers, chills, nausea, or vomiting noted at this time. Overall I feel like he is making good progress it does feel like that the right leg will we perform the [employer] seems to do with a bit better than three layer wrap on the left which slid down on him. We may switch to doing bilateral in the book wraps. 5/4; I have not seen Mr. Trabucco in quite some time. According to our case manager he did not have an open wound on his left leg last week. He had 1 remaining wound on the right posterior medial calf. He arrives today with multiple openings on the left leg probably were blisters and/or wrap injuries from Unna boots. I do not think the Unna boot's will provide adequate compression on the left. I am also not clear about the frequency he is using the compression pumps. 03/17/19 on evaluation today patient appears to be doing excellent in regard to his lower extremities compared to last week's evaluation apparently. He had gotten significantly worse last week which is unfortunate. The [employer] wrap on the left did not seem to do very well for him at all and in fact it didn't control his swelling significantly enough he had an additional outbreak. Subsequently we go back to the four layer compression wrap on the left. This is good news. At least in that he is doing better and the wound seem to be killing him. He still has not heard anything from the lymphedema clinic. 03/24/19 on evaluation today patient actually appears to be doing much better in regard to his bilateral lower Trinity as compared to last week when I saw him. Fortunately there's no signs of active infection at this time. He has been tolerating the dressing changes without  complication. Overall I'm extremely pleased with the progress and appearance in general. 04/07/19 on evaluation today patient appears to be doing well in regard to his bilateral lower extremities. His swelling is significantly down from where it was previous. With that being said he does have a couple blisters still open at this point but fortunately nothing that seems to be too severe and again the majority of the larger openings has healed at this time. 04/14/19 on evaluation today patient actually appears to be doing quite well in regard to his bilateral lower extremities in fact I'm not even sure there's anything significantly open at this time at any site. Nonetheless he did have some trouble with these wraps where they are somewhat irritating him secondary to the fact that he has noted that the graph wasn't too close down to the end of this foot in a little bit short as well up to his knee. Otherwise things seem to be doing quite well. 04/21/19 upon evaluation today patient's wound bed actually showed evidence of being completely healed in regard to both lower extremities which is excellent news. There does not appear to be any signs of active infection which is also good news. I'm very pleased in this regard.  No fevers, chills, nausea, or vomiting noted at this time. 04/28/19 on evaluation today patient appears to be doing a little bit worse in regard to both lower extremities on the left mainly due to the fact that when he went infection disease the wrap was not wrapped quite high enough he developed a blister above this. On the right he is a small open area of nothing too significant but again this is continuing to give him some trouble he has been were in the Velcro compression that he has at home. 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 He's been tolerating the dressing changes without complication. Fortunately there's no signs of  active infection at this time. No fevers, chills, nausea, or vomiting noted at this time. We have been trying to get an appointment with her lymphedema clinic in [facility] but unfortunately nobody can get them on phone with not been able to even fax information over the patient likewise is not been able to get in touch with them. Overall I'm not sure exactly what's going on here with to reach out again today. 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 Still having a lot of drainage unfortunately. He tells me especially in the left but even on the right. There's no signs of active infection which is good news we've been using so ratcheted up to this point. 05/19/19 on evaluation today patient actually appears to be doing quite well with regard to his left lower extremity which is great news. Fortunately in regard to the right lower extremity has an issues with his wrap and he subsequently did remove this from what I'm understanding. Nonetheless long story short is what he had rewrapped once he removed it subsequently had maggots underneath this wrap whenever he came in for evaluation today. With that being said they were obviously completely cleaned away by the nursing staff. The visit today which is excellent news. However he does appear to potentially have some infection around the right ankle region where the maggots were located as well. He will likely require anabiotic therapy today. 05/26/19 on evaluation today patient actually appears to be doing much better in regard to his bilateral lower extremities. I feel like the infection is under much better control. With that being said there were maggots noted when the wrap was removed yet again today. Again this could have potentially been left over from previous although at this time there does not appear to be any signs of significant drainage there  was obviously on the wrap some drainage as well this contracted gnats or otherwise. Either way I do not see anything that appears to be doing worse in my pinion and in fact I think his drainage has slowed down quite significantly likely mainly due to the fact to his infection being under better control. 06/02/2019 on evaluation today patient actually appears to be doing well with regard to his bilateral lower extremities there is no signs of active infection at this time which is great news. With that being said he does have several open areas more so on the right than the left but nonetheless these are all significantly better than previously noted. 06/09/2019 on evaluation today patient actually appears to be doing well. His wrap stayed up and he did not cause any problems he had more drainage on the right compared to the  left but overall I do not see any major issues at this time which is great news. 06/16/2019 on evaluation today patient appears to be doing excellent with regard to his lower extremities the only area that is open is a new blister that can have opened as of today on the medial ankle on the left. Other than this he really seems to be doing great I see no major issues at this point. 06/23/2019 on evaluation today patient appears to be doing quite well with regard to his bilateral lower extremities. In fact he actually appears to be almost completely healed there is a small area of weeping noted of the right lower extremity just above the ankle. Nonetheless fortunately there is no signs of active infection at this time which is good news. No fevers, chills, nausea, vomiting, or diarrhea. 8/24; the patient arrived for a nurse visit today but complained of very significant pain in the left leg and therefore I was asked to look at this. Noted that he did not have an open area on the left leg last week nevertheless this was wrapped. The patient states that he is not been able to put his  compression pumps on the left leg because of the discomfort. He has not been systemically unwell 06/30/2019 on evaluation today patient unfortunately despite being excellent last week is doing much worse with regard to his left lower extremity today. In fact he had to come in for a nurse on Monday where his left leg had to be rewrapped due to excessive weeping Dr. Leanord Hawking placed him on doxycycline at that point. Fortunately there is no signs of active infection Systemically at this time which is good news. 07/07/2019 in regard to the patient's wounds today he actually seems to be doing well with his right lower extremity there really is nothing open or draining at this point this is great news. Unfortunately the left lower extremity is given him additional trouble at this time. There does not appear to be any signs of active infection nonetheless he does have a lot of edema and swelling noted at this point as well as blistering all of which has led to a much more poor appearing leg at this time compared to where it was 2 weeks ago when it was almost completely healed. Obviously this is a little discouraging for the patient. He is try to contact the lymphedema clinic in [place] he has not been able to get through to them. 07/14/2019 on evaluation today patient actually appears to be doing slightly better with regard to his left lower extremity ulcers. Overall I do feel like at least at the top of the wrap that we have been placing this area has healed quite nicely and looks much better. The remainder of the leg is showing signs of improvement. Unfortunately in the thigh area he still has an open region on the left and again on the right he has been utilizing just a Band-Aid on an area that also opened on the thigh. Again this is an area that were not able to wrap although we did do an Ace wrap to provide some compression that something that obviously is a little less effective than the compression wraps  we have been using on the lower portion of the leg. He does have an appointment with the lymphedema clinic in [facility] on Friday. 07/21/2019 on evaluation today patient appears to be doing better with regard to his lower extremity ulcers. He has been tolerating the  dressing changes without complication. Fortunately there is no signs of active infection at this time. No fevers, chills, nausea, vomiting, or diarrhea. I did receive the paperwork from the physical therapist at the lymphedema clinic in [place]. Subsequently I signed off on that this morning and sent that back to him for further progression with the treatment plan. 07/28/2019 on evaluation today patient appears to be doing very well with regard to his right lower extremity where I do not see any open wounds at this point. Fortunately he is feeling great as far as that is concerned as well. In regard to the left lower extremity he has been having issues with still several areas of weeping and edema although the upper leg is doing better his lower leg still I think is going require the compression wrap at this time. No fevers, chills, nausea, vomiting, or diarrhea. 08/04/2019 on evaluation today patient unfortunately is having new wounds on the right lower extremity. Again we have been using Unna boot wrap on that side. We switched him to using his juxta lite wrap at home. With that being said he tells me he has been using it although his legs extremely swollen and to be honest really does not appear that he has been. I cannot know that for sure however. Nonetheless he has multiple new wounds on the right lower extremity at this time. Obviously we will have to see about getting this rewrapped for him today. 08/11/2019 on evaluation today patient appears to be doing fairly well with regard to his wounds. He has been tolerating the dressing changes including the compression wraps without complication. He still has a lot of edema in his  upper thigh regions bilaterally he is supposed to be seeing the lymphedema clinic on the 15th of this month once his wraps arrive for the upper part of his legs. 08/18/2019 on evaluation today patient appears to be doing well with regard to his bilateral lower extremities at this point. He has been tolerating the dressing changes without complication. Fortunately there is no signs of active infection which is also good news. He does have a couple weeping areas on the first and second toe of the right foot he also has just a small area on the left foot upper leg and a small area on the left lower leg but overall he is doing quite well in my opinion. He is supposed to be getting his wraps shortly in fact tomorrow and then subsequently is seeing the lymphedema clinic next Wednesday on the 21st. Of note he is also leaving on the 25th to go on vacation for a week to the beach. For that reason and since there is some uncertainty about what there can be doing at lymphedema clinic next Wednesday I am get a make an appointment for next Friday here for [place] to see what we need to do for him prior to him leaving for vacation. 10/23; patient arrives in considerable pain predominantly in the upper posterior calf just distal to the popliteal fossa also in the wound anteriorly above the major wound. This is probably cellulitis and he has had this recurrently in the past. He has no open wound on the right side and he has had an Radio broadcast assistant in that area. Finally I note that he has an area on the left posterior calf which by enlarge is mostly epithelialized. This protrudes beyond the borders of the surrounding skin in the setting of dry scaly skin and lymphedema. The patient is  leaving for [facility] on Sunday. Per his longstanding pattern, he will not take his compression pumps with him predominantly out of fear that they will be stolen. He therefore asked that we put a Unna boot back on the right leg. He will also  contact the wound care center in [facility] to see if they can change his dressing in the mid week. 11/3; patient returned from his vacation to [facility]. He was seen on 1 occasion at their wound care center. They did a 2 layer compression system as they did not have our 4-layer wrap. I am not completely certain what they put on the wounds. They did not change the Unna boot on the right. The patient is also seeing a lymphedema specialist physical therapist in [place]. It appears that he has some compression sleeve for his thighs which indeed look quite a bit better than I am used to seeing. He pumps over these with his external compression pumps. 11/10; the patient has a new wound on the right medial thigh otherwise there is no open areas on the right. He has an area on the left leg posteriorly anteriorly and medially and an area over the left second toe. We have been using silver alginate. He thinks the injury on his thigh is secondary to friction from the compression sleeve he has. 11/17; the patient has a new wound on the right medial thigh last week. He thinks this is because he did not have a underlying stocking for his thigh juxta lite apparatus. He now has this. The area is fairly large and somewhat angry but I do not think he has underlying cellulitis. He has a intact blister on the right anterior tibial area. Small wound on the right great toe dorsally Small area on the medial left calf. 11/30; the patient does not have any open areas on his right leg and we did not take his juxta lite stocking off. However he states that on Friday his compression wrap fell down lodging around his upper mid calf area. As usual this creates a lot of problems for him. He called urgently today to be seen for a nurse visit however the nurse visit turned into a provider visit because of extreme erythema and pain in the left anterior tibia extending laterally and posteriorly. The area that is  problematic is extensive 10/06/2019 upon evaluation today patient actually appears to be doing poorly in regard to his left lower extremity. He Dr. Leanord Hawking did place him on doxycycline this past Monday apparently due to the fact that he was doing much worse in regard to this left leg. Fortunately the doxycycline does seem to be helping. Unfortunately we are still having a very difficult time getting his edema under any type of control in order to anticipate discharge at some point. The only way were really able to control his lymphedema really is with compression wraps and that has only even seemingly temporary. He has been seeing a lymphedema clinic they are trying to help in this regard but still this has been somewhat frustrating in general for the patient. 10/13/19 on evaluation today patient appears to be doing excellent with regard to his right lower extremity as far as the wounds are concerned. His swelling is still quite extensive unfortunately. He is still having a lot of drainage from the thigh areas bilaterally which is unfortunate. He's been going to lymphedema clinic but again he still really does not have this edema under control as far as  his lower extremities are concern. With regard to his left lower extremity this seems to be improving and I do believe the doxycycline has been of benefit for him. He is about to complete the doxycycline. 10/20/2019 on evaluation today patient appears to be doing poorly in regard to his bilateral lower extremities. More in the right thigh he has a lot of irritation at this site unfortunately. In regard to the left lower extremity the wrap was not quite as high it appears and does seem to have caused him some trouble as well. Fortunately there is no evidence of systemic infection though he does have some blue-green drainage which has me concerned for the possibility of Pseudomonas. He tells me he is previously taking Cipro without complications and he really  does not care for Levaquin however due to some of the side effects he has. He is not allergic to any medications specifically antibiotics that were aware of. 10/27/2019 on evaluation today patient actually does appear to be for the most part doing better when compared to last week's evaluation. With that being said he still has multiple open wounds over the bilateral lower extremities. He actually forgot to start taking the Cipro and states that he still has the whole bottle. He does have several new blisters on left lower extremity today I think I would recommend he go ahead and take the Cipro based on what I am seeing at this point. 12/30-Patient comes at 1 week visit, 4 layer compression wraps on the left and Unna boot on the right, primary dressing Xtrasorb and silver alginate. Patient is taking his Cipro and has a few more days left probably 5-6, and the legs are doing better. He states he is using his compressions devices which I believe he has 11/10/2019 on evaluation today patient actually appears to be much better than last time I saw him 2 weeks ago. His wounds are significantly improved and overall I am very pleased in this regard. Fortunately there is no signs of active infection at this time. He is just a couple of days away from completing Cipro. Overall his edema is much better he has been using his lymphedema pumps which I think is also helping at this point. 11/17/2019 on evaluation today patient appears to be doing excellent in regard to his wounds in general. His legs are swollen but not nearly as much as they have been in the past. Fortunately he is tolerating the compression wraps without complication. No fevers, chills, nausea, vomiting, or diarrhea. He does have some erythema however in the distal portion of his right lower extremity specifically around the forefoot and toes there is a little bit of warmth here as well. 11/24/2019 on evaluation today patient appears to be doing well  with regard to his right lower extremity I really do not see any open wounds at this point. His left lower extremity does have several open areas and his right medial thigh also is open. Other than this however overall the patient seems to be making good progress and I am very pleased at this point. 12/01/2019 on evaluation today patient appears to be doing poorly at this point in regard to his left lower extremity has several new blisters despite the fact that we have him in compression wraps. In fact he had a 4-layer compression wrap, his upper thigh wrapped from lymphedema clinic, and a juxta light over top of the 4 layer compression wrap the lymphedema clinic applied and despite all this he  still develop blisters underneath. Obviously this does have me concerned about the fact that unfortunately despite what we are doing to try to get wounds healed he continues to have new areas arise I do not think he is ever good to be at the point where he can realistically just use wraps at home to keep things under control. Typically when we heal him it takes about 1-2 days before he is back in the clinic with severe breakdown and blistering of his lower extremities bilaterally. This is happened numerous times in the past. Unfortunately I think that we may need some help as far as overall fluid overload to kind of limit what we are seeing and get things under better control. 12/08/2019 on evaluation today patient presents for follow-up concerning his ongoing bilateral lower extremity edema. Unfortunately he is still having quite a bit of swelling the compression wraps are controlling this to some degree but he did see Dr. [name] his cardiologist I do have that available for review today as far as the appointment was concerned that was on 12/06/2019. Obviously that she has been 2 days ago. The patient states that he is only been taking the Lasix 80 mg 1 time a day he had told me previously he was taking this twice a  day. Nonetheless Dr. [name] recommended this be up to 80 mg 2 times a day for the patient as he did appear to be fluid overloaded. With that being said the patient states he did this yesterday and he was unable to go anywhere or do anything due to the fact that he was constantly having to urinate. Nonetheless I think that this is still good to be something that is important for him as far as trying to get his edema under control at all things that he is going to be able to just expect his wounds to get under control and things to be better without going through at least a period of time where he is trying to stabilize his fluid management in general and I think increasing the Lasix is likely the first step here. It was also mentioned the possibility that the patient may require metolazone. With that being said he wanted to have the patient take Lasix twice a day first and then reevaluating 2 months to see where things stand. 12/15/2019 upon evaluation today patient appears to be doing regard to his legs although his toes are showing some signs of weeping especially on the left at this point to some degree on the right. There does not appear to be any signs of active infection and overall I do feel like the compression wraps are doing well for him but he has not been able to take the Lasix at home and the increased dose that Dr. [name] recommended. He tells me that just not go to be feasible for him. Nonetheless I think in this case he should probably send a message to Dr. [name] in order to discuss options from the standpoint of possible admission to get the fluid off or otherwise going forward. 12/22/2019 upon evaluation today patient appears to be doing fairly well with regard to his lower extremities at this point. In fact he would be doing excellent if it was not for the fact that his right anterior thigh apparently had an allergic reaction to adhesive tape that he used. The wound itself that we have  been monitoring actually appears to be healed. There is a lot of irritation at this point.  12/29/2019 upon evaluation today patient appears to be doing well in regard to his lower extremities. His left medial thigh is open and somewhat draining today but this is the only region that is open the right has done much better with the treatment utilizing the steroid cream that I prescribed for him last week. Overall I am pleased in that regard. Fortunately there is no signs of active infection at this time. No fevers, chills, nausea, vomiting, or diarrhea. 01/05/2020 upon evaluation today patient appears to be doing more poorly in regard to his right lower extremity at this point upon evaluation today. Unfortunately he continues to have issues in this regard and I think the biggest issue is controlling his edema. This obviously is not very well controlled at this point is been recommended that he use the Lasix twice a day but he has not been able to do that. Unfortunately I think this is leading to an issue where honestly he is not really able to effectively control his edema and therefore the wounds really are not doing significantly better. I do not think that he is going to be able to keep things under good control unless he is able to control his edema much better. I discussed this again in great detail with him today. 01/12/2020 good news is patient actually appears to be doing quite well today at this point. He does have an appointment with lymphedema clinic tomorrow. His legs appear healed and the toe on the left is almost completely healed. In general I am very pleased with how things stand at this point. 01/19/2020 upon evaluation today patient appears to actually be doing well in regard to his lower extremities there is nothing open at this point. Fortunately he has done extremely well more recently. Has been seeing lymphedema clinic as well. With that being said he has Velcro wraps for his lower legs  as well as his upper legs. The only wound really is on his toe which is the right great toe and this is barely anything even there. With all that being said I think it is good to be appropriate today to go ahead and switch him over to the Velcro compression wraps. 01/26/2020 upon evaluation today patient appears to be doing worse with regard to his lower extremities after last week switch him to Velcro compression wraps. Unfortunately he lasted less than 24 hours he did not have the sock portion of his Velcro wrap on the left leg and subsequently developed a blister underneath the Velcro portion. Obviously this is not good and not what we were looking for at this point. He states the lymphedema clinic did tell him to wear the wrap for 23 hours and take him off for 1 I am okay with that plan but again right now we got a get things back under control again he may have some cellulitis noted as well. 02/02/2020 upon evaluation today patient unfortunately appears to have several areas of blistering on his bilateral lower extremities today mainly on the feet. His legs do seem to be doing somewhat better which is good news. Fortunately there is no evidence of active infection at this time. No fevers, chills, nausea, vomiting, or diarrhea. 02/16/2020 upon evaluation today patient appears to be doing well at this time with regard to his legs. He has a couple weeping areas on his toes but for the most part everything is doing better and does appear to be sealed up on his legs which is excellent  news. We can continue with wrapping him at this point as he had every time we discontinue the wraps he just breaks out with new wounds. There is really no point in is going forward with this at this point. 03/08/2020 upon evaluation today patient actually appears to be doing quite well with regard to his lower extremity ulcers. He has just a very superficial and really almost nonexistent blister on the left lower extremity he  has in general done very well with the compression wraps. With that being said I do not see any signs of infection at this time which is good news. 03/29/2020 upon evaluation today patient appears to be doing well with regard to his wounds currently except for where he had several new areas that opened up due to some of the wrap slipping and causing him trouble. He states he did not realize they had slipped. Nonetheless he has a 1 area on the right and 3 new areas on the left. Fortunately there is no signs of active infection at this time which is great news. 04/05/2020 upon evaluation today patient actually appears to be doing quite well in general in regard to his legs currently. Fortunately there is no signs of active infection at this time. No fevers, chills, nausea, vomiting, or diarrhea. He tells me next week that he will actually be seen in the lymphedema clinic on Thursday at 10 AM I see him on Wednesday next week. 04/12/2020 upon evaluation today patient appears to be doing very well with regard to his lower extremities bilaterally. In fact he does not appear to have any open wounds at this point which is good news. Fortunately there is no signs of active infection at this time. No fevers, chills, nausea, vomiting, or diarrhea. 04/19/2020 upon evaluation today patient appears to be doing well with regard to his wounds currently on the bilateral lower extremities. There does not appear to be any signs of active infection at this time. Fortunately there is no evidence of systemic infection and overall very pleased at this point. Nonetheless after I held him out last week he literally had blisters the next morning already which swelled up with him being right back here in the clinic. Overall I think that he is just not can be able to be discharged with his legs the way they are he is much to volume overloaded as far as fluid is concerned and that was discussed with him today of also discussed this but  should try the clinic nurse manager as well as Dr. Leanord Hawking. 04/26/2020 upon evaluation today patient appears to be doing better with regard to his wounds currently. He is making some progress and overall swelling is under good control with the compression wraps. Fortunately there is no evidence of active infection at this time. 05/10/2020 on evaluation today patient appears to be doing overall well in regard to his lower extremities bilaterally. He is Tolerating the compression wraps without complication and with what we are seeing currently I feel like that he is making excellent progress. There is no signs of active infection at this time. 05/24/2020 upon evaluation today patient appears to be doing well in regard to his legs. The swelling is actually quite a bit down compared to where it has been in the past. Fortunately there is no sign of active infection at this time which is also good news. With that being said he does have several wounds on his toes that have opened up at this point.  05/31/2020 upon evaluation today patient appears to be doing well with regard to his legs bilaterally where he really has no significant fluid buildup at this point overall he seems to be doing quite well. Very pleased in this regard. With regard to his toes these also seem to be drying up which is excellent. We have continue to wrap him as every time we tried as a transition to the juxta light wraps things just do not seem to get any better. 06/07/2020 upon evaluation today patient appears to be doing well with regard to his right leg at this point. Unfortunately left leg has a lot of blistering he tells me the wrap started to slide down on him when he tried to put his other Velcro wrap over top of it to help keep things in order but nonetheless still had some issues. 06/14/2020 on evaluation today patient appears to be doing well with regard to his lower extremity ulcers and foot ulcers at this point. I feel like  everything is actually showing signs of improvement which is great news overall there is no signs of active infection at this time. No fevers, chills, nausea, vomiting, or diarrhea. 06/21/2020 on evaluation today patient actually appears to be doing okay in regard to his wounds in general. With that being said the biggest issue I see is on his right foot in particular the first and second toe seem to be doing a little worse due to the fact this is staying very wet. I think he is probably getting need to change out his dressings a couple times in between each week when we see him in regard to his toes in order to keep this drier based on the location and how this is proceeding. 06/28/2020 on evaluation today patient appears to be doing a little bit more poorly overall in regard to the appearance of the skin I am actually somewhat concerned about the possibility of him having a little bit of an infection here. We discussed the course of potentially giving him a doxycycline prescription which he is taken previously with good result. With that being said I do believe that this is potentially mild and at this point easily fixed. I just do not want anything to get any worse. 07/12/2020 upon evaluation today patient actually appears to be making some progress with regard to his legs which is great news there does not appear to be any evidence of active infection. Overall very pleased with where things stand. 07/26/2020 upon evaluation today patient appears to be doing well with regard to his leg ulcers and toe ulcers at this point. He has been tolerating the compression wraps without complication overall very pleased in this regard. 08/09/2020 upon evaluation today patient appears to be doing well with regard to his lower extremities bilaterally. Fortunately there is no signs of active infection overall I am pleased with where things stand. 08/23/2020 on evaluation today patient appears to be doing well with  regard to his wound. He has been tolerating the dressing changes without complication. Fortunately there is no signs of active infection at this time. Overall his legs seem to be doing quite well which is great news and I am very pleased in that regard. No fevers, chills, nausea, vomiting, or diarrhea. 09/13/2020 upon evaluation today patient appears to be doing okay in regard to his lower extremities. He does have a fairly large blister on the right leg which I did remove the blister tissue from today so we can  get this to dry out other than that however he seems to be doing quite well. There is no signs of active infection at this time. 09/27/2020 upon evaluation today patient appears to actually be doing some better in regard to his right leg. Fortunately signs of active infection at this time which is great news. No fevers, chills, nausea, vomiting, or diarrhea. 10/04/2020 upon evaluation today patient actually appears to be showing signs of improvement which is great news with regard to his leg ulcers. Fortunately there is no signs of active infection which is great news he is still taking the antibiotics currently. No fevers, chills, nausea, vomiting, or diarrhea. 10/18/2020 on evaluation today patient appears to be doing well with regard to his legs currently. He has been tolerating the dressing changes including the wraps without complication. Fortunately there is no signs of active infection at this time. No fevers, chills, nausea, vomiting, or diarrhea. Electronic Signature(s) Signed: 10/18/2020 12:23:35 PM By: Lenda Kelp PA-C Entered By: Lenda Kelp on 10/18/2020 12:23:34 -------------------------------------------------------------------------------- Physical Exam Details Patient Name: Date of Service: [name], Henrick J. 10/18/2020 10:30 A M Medical Record Number: [id number] Patient Account Number: [id number] Date of Birth/Sex: Treating RN: Nov 21, 1950 (69 y.o. [name] Primary Care Provider: Nicoletta Ba Other Clinician: Referring Provider: Treating Provider/Extender: Adele Dan in Treatment: 247 Constitutional Obese and well-hydrated in no acute distress. Respiratory normal breathing without difficulty. Psychiatric this patient is able to make decisions and demonstrates good insight into disease process. Alert and Oriented x 3. pleasant and cooperative. Notes Upon inspection patient's wound bed actually showed signs of good granulation at this time and excellent epithelization pretty much across the board. There was no signs of active infection at this time which is great news. Electronic Signature(s) Signed: 10/18/2020 12:24:12 PM By: Lenda Kelp PA-C Entered By: Lenda Kelp on 10/18/2020 12:24:11 -------------------------------------------------------------------------------- Physician Orders Details Patient Name: Date of Service: [name], Erland J. 10/18/2020 10:30 A M Medical Record Number: [id number] Patient Account Number: [id number] Date of Birth/Sex: Treating RN: [birth year] (69 y.o. 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 Nurse Visit: - 12/22 and 12/29 Bathing/ Shower/  Hygiene May shower with protection but do not get wound dressing(s) wet. Edema Control - Lymphedema / SCD / Other Bilateral Lower Extremities Lymphedema Pumps. Use Lymphedema pumps on leg(s) 2-3 times a day for 45-60 minutes. If wearing any wraps or hose, do not remove them. Continue exercising as instructed. Elevate legs to the level of the heart or above for 30 minutes daily and/or when sitting, a frequency of: Avoid standing for long periods of time. Exercise regularly Non Wound Condition Left Lower Extremity Other Non Wound Condition Orders/Instructions: - lotion to leg, 4 layer compression wrap Wound Treatment Wound #185 - Lower Leg Wound Laterality: Right, Lateral Peri-Wound Care: Sween Lotion (Moisturizing lotion) 1 x Per Week Discharge Instructions: Apply moisturizing lotion  as directed Prim Dressing: KerraCel Ag Gelling Fiber Dressing, 4x5 in (silver alginate) 1 x Per Week ary Discharge Instructions: Apply silver alginate to wound bed as instructed Secondary Dressing: ABD Pad, 8x10 1 x Per Week Discharge Instructions: Apply over primary dressing as directed. Compression Wrap: Unnaboot w/Calamine, 4x10 (in/yd) 1 x Per Week Discharge Instructions: Apply Unnaboot as directed. Electronic Signature(s) Signed: 10/18/2020 1:05:14 PM By: Lenda Kelp PA-C Entered By: Lenda Kelp on 10/18/2020 12:24:27 -------------------------------------------------------------------------------- Problem List Details Patient Name: Date of Service: [name], Kou J. 10/18/2020 10:30 A M Medical Record Number: [id number] Patient Account Number: [id number] Date of Birth/Sex: Treating RN: January 18, 1951 (69 y.o. [name] Primary Care Provider: Nicoletta Ba Other Clinician: Referring Provider: Treating Provider/Extender: Adele Dan in Treatment: [phone] Active Problems ICD-10 Encounter Code Description Active Date MDM Diagnosis L97.211 Non-pressure  chronic ulcer of right calf limited to breakdown of skin 06/30/2018 No Yes L97.221 Non-pressure chronic ulcer of left calf limited to breakdown of skin 09/30/2016 No Yes 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 of 01/22/2016 No Yes bilateral lower extremity I89.0 Lymphedema, not elsewhere classified 01/22/2016 No Yes E11.622 Type 2 diabetes mellitus with other skin ulcer 01/22/2016 No Yes 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, unspecified 01/22/2016 No Yes L03.116 Cellulitis of left lower limb 04/01/2017 No Yes Inactive Problems ICD-10 Code Description Active Date Inactive Date 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 skin 06/30/2017 06/30/2017 L97.521 Non-pressure chronic ulcer of other 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 of skin 04/27/2018 04/27/2018 L03.115 Cellulitis of right lower limb 12/22/2017 12/22/2017 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 06/30/2018 06/30/2018 L97.511 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 of skin 06/30/2018 06/30/2018 Resolved Problems Electronic Signature(s) Signed: 10/18/2020 12:22:33 PM By: Lenda Kelp PA-C Previous Signature: 10/18/2020 11:27:17 AM Version By: Lenda Kelp PA-C Entered By: Lenda Kelp on 10/18/2020 12:22:33 -------------------------------------------------------------------------------- Progress Note Details Patient Name: Date of Service: [name], Marshell J. 10/18/2020 10:30 A M Medical Record Number: [id number] Patient Account Number: [id number] Date of Birth/Sex: Treating RN: 08/17/1951 (69 y.o. [name] Primary Care Provider: Nicoletta Ba Other Clinician: Referring Provider: Treating Provider/Extender: Adele Dan in Treatment: [phone] Subjective Chief Complaint Information obtained from Patient patient is here for evaluation  venous/lymphedema weeping History of Present Illness (HPI) Referred by PCP for consultation. Patient has long standing history of BLE venous stasis, no prior ulcerations. At beginning of month, developed cellulitis and weeping. Received IM Rocephin followed by Keflex and resolved. Wears compression stocking, appr 6 months old. Not sure strength. No present drainage. 01/22/16 this is a patient who is a type II diabetic on insulin. 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 He tells me he religiously wears pressure stockings of uncertain strength. He was here with weeping edema about 8 months ago but did not have an open wound. Roughly a month ago he had a reopening on his bilateral legs. He is been using bandages and Neosporin. He does not complain of pain. 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 Last BUN/creatinine I have is from 11/20/15 at 13 and 1.0 respectively 01/29/16; patient arrives today having tolerated the Profore wrap. He brought in his stockings and these are 18 mmHg  stockings he bought from [place]. The compression here is likely inadequate. He does not complain of pain or excessive drainage she has no systemic symptoms. The wound on the right looks improved as does the one on the left although one on the left is more substantial with still tissue at risk below the actual wound area on the bilateral posterior calf 02/05/16; patient arrives with poor edema control. He states that we did put a 4 layer compression on it last week. No weight appear 5 this. 02/12/16; the area on the posterior right Has healed. The left Has a substantial wound that has necrotic surface eschar that requires a debridement with a curette. 02/16/16;the patient called or a Nurse visit secondary to increased swelling. He had been in earlier in the week with his right leg healed.  He was transitioned to is on pressure stocking on the right leg with the only open wound on the left, a substantial area on the left posterior calf. Note he has a history of severe lower extremity edema, he has a history of chronic atrial fibrillation but not heart failure per my notes but I'll need to research this. He is not complaining of chest pain shortness of breath or orthopnea. The intake nurse noted blisters on the previously closed right leg 02/19/16; this is the patient's regular visit day. I see him on Friday with escalating edema new wounds on the right leg and clear signs of at least right ventricular heart failure. I increased his Lasix to 40 twice a day. He is returning currently in follow-up. States he is noticed a decrease in that the edema 02/26/16 patient's legs have much less edema. There is nothing really open on the right leg. The left leg has improved condition of the large superficial wound on the posterior left leg 03/04/16; edema control is very much better. The patient's right leg wounds have healed. On the left leg he continues to have severe venous inflammation on the posterior aspect of the left leg. There is no tenderness and I don't think any of this is cellulitis. 03/11/16; patient's right leg is married healed and he is in his own stocking. The patient's left leg has deteriorated somewhat. There is a lot of erythema around the wound on the posterior left leg. There is also a significant rim of erythema posteriorly just above where the wrap would've ended there is a new wound in this location and a lot of tenderness. Can't rule out cellulitis in this area. 03/15/16; patient's right leg remains healed and he is in his own stocking. The patient's left leg is much better than last review. His major wound on the posterior aspect of his left Is almost fully epithelialized. He has 3 small injuries from the wraps. Really. Erythema seems a lot better on antibiotics 03/18/16; right  leg remains healed and he is in his own stocking. The patient's left leg is much better. The area on the posterior aspect of the left calf is fully epithelialized. His 3 small injuries which were wrap injuries on the left are improved only one seems still open his erythema has resolved 03/25/16; patient's right leg remains healed and he is in his own stocking. There is no open area today on the left leg posterior leg is completely closed up. His wrap injuries at the superior aspect of his leg are also resolved. He looks as though he has some irritation on the dorsal ankle but this is fully epithelialized without evidence of infection. 03/28/16;  we discharged this patient on Monday. Transitioned him into his own stocking. There were problems almost immediately with uncontrolled swelling weeping edema multiple some of which have opened. He does not feel systemically unwell in particular no chest pain no shortness of breath and he does not feel 04/08/16; the edema is under better control with the Profore light wrap but he still has pitting edema. There is one large wound anteriorly 2 on the medial aspect of his left leg and 3 small areas on the superior posterior calf. Drainage is not excessive he is tolerating a Profore light well 04/15/16; put a Profore wrap on him last week. This is controlled is edema however he had a lot of pain on his left anterior foot most of his wounds are healed 04/22/16 once again the patient has denuded areas on the left anterior foot which he states are because his wrap slips up word. He saw his primary physician today is on Lasix 40 twice a day and states that he his weight is down 20 pounds over the last 3 months. 04/29/16: Much improved. left anterior foot much improved. He is now on Lasix 80 mg per day. Much improved edema control 05/06/16; I was hoping to be able to discharge him today however once again he has blisters at a low level of where the compression was placed last  week mostly on his left lateral but also his left medial leg and a small area on the anterior part of the left foot. 05/09/16; apparently the patient went home after his appointment on 7/4 later in the evening developing pain in his upper medial thigh together with subjective fever and chills although his temperature was not taken. The pain was so intense he felt he would probably have to call 911. However he then remembered that he had leftover doxycycline from a previous round of antibiotics and took these. By the next morning he felt a lot better. He called and spoke to one of our nurses and I approved doxycycline over the phone thinking that this was in relation to the wounds we had previously seen although they were definitely were not. The patient feels a lot better old fever no chills he is still working. Blood sugars are reasonably controlled 05/13/16; patient is back in for review of his cellulitis on his anterior medial upper thigh. He is taking doxycycline this is a lot better. Culture I did of the nodular area on the dorsal aspect of his foot grew MRSA this also looks a lot better. 05/20/16; the patient is cellulitis on the medial upper thigh has resolved. All of his wound areas including the left anterior foot, areas on the medial aspect of the left calf and the lateral aspect of the calf at all resolved. He has a new blister on the left dorsal foot at the level of the fourth toe this was excised. No evidence of infection 05/27/16; patient continues to complain weeping edema. He has new blisterlike wounds on the left anterior lateral and posterior lateral calf at the top of his wrap levels. The area on his left anterior foot appears better. He is not complaining of fever, pain or pruritus in his feet. 05/30/16; the patient's blisters on his left anterior leg posterior calf all look improved. He did not increase the Lasix 100 mg as I suggested because he was going to run out of his 40 mg  tablets. He is still having weeping edema of his toes 06/03/16; I renewed his Lasix  at 80 mg once a day as he was about to run out when I last saw him. He is on 80 mg of Lasix now. I have asked him to cut down on the excessive amount of water he was drinking and asked him to drink according to his thirst mechanisms 06/12/2016 -- was seen 2 days ago and was supposed to wear his compression stockings at home but he is developed lymphedema and superficial blisters on the left lower extremity and hence came in for a review 06/24/16; the remaining wound is on his left anterior leg. He still has edema coming from between his toes. There is lymphedema here however his edema is generally better than when I last saw this. He has a history of atrial fibrillation but does not have a known history of congestive heart failure nevertheless I think he probably has this at least on a diastolic basis. 07/01/16 I reviewed his echocardiogram from January 2017. This was essentially normal. He did not have LVH, EF of 55-60%. His right ventricular function was normal although he did have trivial tricuspid and pulmonic regurgitation. This is not audible on exam however. I increased his Lasix to do massive edema in his legs well above his knees I think in early July. He was also drinking an excessive amount of water at the time. 07/15/16; missed his appointment last week because of the Labor Day holiday on Monday. He could not get another appointment later in the week. Started to feel the wrap digging in superiorly so we remove the top half and the bottom half of his wrap. He has extensive erythema and blistering superiorly in the left leg. Very tender. Very swollen. Edema in his foot with leaking edema fluid. He has not been systemically unwell 07/22/16; the area on the left leg laterally required some debridement. The medial wounds look more stable. His wrap injury wounds appear to have healed. Edema and his foot is better,  weeping edema is also better. He tells me he is meeting with the supplier of the external compression pumps at work 08/05/16; the patient was on vacation last week in [facility]. His wrap is been on for an extended period of time. Also over the weekend he developed an extensive area of tender erythema across his anterior medial thigh. He took to doxycycline yesterday that he had leftover from a previous prescription. The patient complains of weeping edema coming out of his toes 08/08/16; I saw this patient on 10/2. He was tender across his anterior thigh. I put him on doxycycline. He returns today in follow-up. He does not have any open wounds on his lower leg, he still has edema weeping into his toes. 08/12/16; patient was seen back urgently today to follow-up for his extensive left thigh cellulitis/erysipelas. He comes back with a lot less swelling and erythema pain is much better. I believe I gave him Augmentin and Cipro. His wrap was cut down as he stated a roll down his legs. He developed blistering above the level of the wrap that remained. He has 2 open blisters and 1 intact. 08/19/16; patient is been doing his primary doctor who is increased his Lasix from 40-80 once a day or 80 already has less edema. Cellulitis has remained improved in the left thigh. 2 open areas on the posterior left calf 08/26/16; he returns today having new open blisters on the anterior part of his left leg. He has his compression pumps but is not yet been shown how to  use some vital representative from the supplier. 09/02/16 patient returns today with no open wounds on the left leg. Some maceration in his plantar toes 09/10/2016 -- Dr. Leanord Hawking had recently discharged him on 09/02/2016 and he has come right back with redness swelling and some open ulcers on his left lower extremity. He says this was caused by trying to apply his compression stockings and he's been unable to use this and has not been able to use  his lymphedema pumps. He had some doxycycline leftover and he has started on this a few days ago. 09/16/16; there are no open wounds on his leg on the left and no evidence of cellulitis. 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 He does not complain of symptoms here. I am not clear use using his external compression pumps. 09/23/16; I have not seen this patient in 2 weeks. He canceled his appointment 10 days ago as he was going on vacation. He tells me that on Monday he noticed a large area on his posterior left leg which is been draining copiously and is reopened into a large wound. He is been using ABDs and the external part of his juxtalite, according to our nurse this was not on properly. 10/07/16; Still a substantial area on the posterior left leg. Using silver alginate 10/14/16; in general better although there is still open area which looks healthy. Still using silver alginate. He reminds me that this happen before he left for [facility]. T oday while he was showering in the morning. He had been using his juxtalite's 10/21/16; the area on his posterior left leg is fully epithelialized. However he arrives today with a large area of tender erythema in his medial and posterior left thigh just above the knee. I have marked the area. Once again he is reluctant to consider hospitalization. I treated him with oral antibiotics in the past for a similar situation with resolution I think with doxycycline however this area it seems more extensive to me. He is not complaining of fever but does have chills and says states he is thirsty. His blood sugar today was in the 140s at home 10/25/16 the area on his posterior left leg is fully epithelialized although there is still some weeping edema. The large area of tenderness and erythema in his medial and posterior left thigh is a lot less tender although there is still a lot of swelling in this thigh.  He states he feels a lot better. He is on doxycycline and Augmentin that I started last week. This will continued until Tuesday, December 26. I have ordered a duplex ultrasound of the left thigh rule out DVT whether there is an abscess something that would need to be drained I would also like to know. 11/01/16; he still has weeping edema from a not fully epithelialized area on his left posterior calf. Most of the rest of this looks a lot better. He has completed his antibiotics. His thigh is a lot better. Duplex ultrasound did not show a DVT in the thigh 11/08/16; he comes in today with more Denuded surface epithelium from the posterior aspect of his calf. There is no real evidence of cellulitis. The superior aspect of his wrap appears to have put quite an indentation in his leg just below the knee and this may have contributed. He does not complain of pain or fever. We have been using silver alginate as the primary dressing. The area of  cellulitis in the right thigh has totally resolved. He has been using his compression stockings once a week 11/15/16; the patient arrives today with more loss of epithelium from the posterior aspect of his left calf. He now has a fairly substantial wound in this area. The reason behind this deterioration isn't exactly clear although his edema is not well controlled. He states he feels he is generally more swollen systemically. He is not complaining of chest pain shortness of breath fever. T me he has an appointment with his primary physician in early February. He is on 80 mg of oral ells Lasix a day. He claims compliance with the external compression pumps. He is not having any pain in his legs similar to what he has with his recurrent cellulitis 11/22/16; the patient arrives a follow-up of his large area on his left lateral calf. This looks somewhat better today. He came in earlier in the week for a dressing change since I saw him a week ago. He is not complaining of any  pain no shortness of breath no chest pain 11/28/16; the patient arrives for follow-up of his large area on the left lateral calf this does not look better. In fact it is larger weeping edema. The surface of the wound does not look too bad. We have been using silver alginate although I'm not certain that this is a dressing issue. 12/05/16; again the patient follows up for a large wound on the left lateral and left posterior calf this does not look better. There continues to be weeping edema necrotic surface tissue. More worrisome than this once again there is erythema below the wound involving the distal Achilles and heel suggestive of cellulitis. He is on his feet working most of the day of this is not going well. We are changing his dressing twice a week to facilitate the drainage. 12/12/16; not much change in the overall dimensions of the large area on the left posterior calf. This is very inflamed looking. I gave him an. Doxycycline last week does not really seem to have helped. He found the wrap very painful indeed it seems to of dog into his legs superiorly and perhaps around the heel. He came in early today because the drainage had soaked through his dressings. 12/19/16- patient arrives for follow-up evaluation of his left lower extremity ulcers. He states that he is using his lymphedema pumps once daily when there is "no drainage". He admits to not using his lipedema pumps while under current treatment. His blood sugars have been consistently between 150-200. 12/26/16; the patient is not using his compression pumps at home because of the wetness on his feet. I've advised him that I think it's important for him to use this daily. He finds his feet too wet, he can put a plastic bag over his legs while he is in the pumps. Otherwise I think will be in a vicious circle. We are using silver alginate to the major area on his left posterior calf 01/02/17; the patient's posterior left leg has further of all into  3 open wounds. All of them covered with a necrotic surface. He claims to be using his compression pumps once a day. His edema control is marginal. Continue with silver alginate 01/10/17; the patient's left posterior leg actually looks somewhat better. There is less edema, less erythema. Still has 3 open areas covered with a necrotic surface requiring debridement. He claims to be using his compression pumps once a day his edema control is better  01/17/17; the patient's left posterior calf look better last week when I saw him and his wrap was changed 2 days ago. He has noted increasing pain in the left heel and arrives today with much larger wounds extensive erythema extending down into the entire heel area especially tender medially. He is not systemically unwell CBGs have been controlled no fever. Our intake nurse showed me limegreen drainage on his AVD pads. 01/24/17; his usual this patient responds nicely to antibiotics last week giving him Levaquin for presumed Pseudomonas. The whole entire posterior part of his leg is much better much less inflamed and in the case of his Achilles heel area much less tender. He has also had some epithelialization posteriorly there are still open areas here and still draining but overall considerably better 01/31/17- He has continue to tolerate the compression wraps. he states that he continues to use the lymphedema pumps daily, and can increase to twice daily on the weekends. He is voicing no complaints or concerns regarding his LLE ulcers 02/07/17-he is here for follow-up evaluation. He states that he noted some erythema to the left medial and anterior thigh, which he states is new as of yesterday. He is concerned about recurrent cellulitis. He states his blood sugars have been slightly elevated, this morning in the 180s 02/14/17; he is here for follow-up evaluation. When he was last here there was erythema superiorly from his posterior wound in his anterior thigh. He  was prescribed Levaquin however a culture of the wound surface grew MRSA over the phone I changed him to doxycycline on Monday and things seem to be a lot better. 02/24/17; patient missed his appointment on Friday therefore we changed his nurse visit into a physician visit today. Still using silver alginate on the large area of the posterior left thigh. He isn't new area on the dorsal left second toe 03/03/17; actually better today although he admits he has not used his external compression pumps in the last 2 days or so because of work responsibilities over the weekend. 03/10/17; continued improvement. External compression pumps once a day almost all of his wounds have closed on the posterior left calf. Better edema control 03/17/17; in general improved. He still has 3 small open areas on the lateral aspect of his left leg however most of the area on the posterior part of his leg is epithelialized. He has better edema control. He has an ABD pad under his stocking on the right anterior lower leg although he did not let us look at that today. 03/24/17; patient arrives back in clinic today with no open areas however there are areas on the posterior left calf and anterior left calf that are less than 100% epithelialized. His edema is well controlled in the left lower leg. There is some pitting edema probably lymphedema in the left upper thigh. He uses compression pumps at home once per day. I tried to get him to do this twice a day although he is very reticent. 04/01/2017 -- for the last 2 days he's had significant redness, tenderness and weeping and came in for an urgent visit today. 04/07/17; patient still has 6 more days of doxycycline. He was seen by Dr. Meyer Russel last Wednesday for cellulitis involving the posterior aspect, lateral aspect of his Involving his heel. For the most part he is better there is less erythema and less weeping. He has been on his feet for 12 hours o2 over the weekend. Using his  compression pumps once a day 04/14/17  arrives today with continued improvement. Only one area on the posterior left calf that is not fully epithelialized. He has intense bilateral venous inflammation associated with his chronic venous insufficiency disease and secondary lymphedema. We have been using silver alginate to the left posterior calf wound In passing he tells [place] today that the right leg but we have not seen in quite some time has an open area on it but he doesn't want [place] to look at this today states he will show this to [place] next week. 04/21/17; there is no open area on his left leg although he still reports some weeping edema. He showed [place] his right leg today which is the first time we've seen this leg in a long time. He has a large area of open wound on the right leg anteriorly healthy granulation. Quite a bit of swelling in the right leg and some degree of venous inflammation. He told us about the right leg in passing last week but states that deterioration in the right leg really only happened over the weekend 04/28/17; there is no open area on the left leg although there is an irritated part on the posterior which is like a wrap injury. The wound on the right leg which was new from last week at least to [place] is a lot better. 05/05/17; still no open area on the left leg. Patient is using his new compression stocking which seems to be doing a good job of controlling the edema. He states he is using his compression pumps once per day. The right leg still has an open wound although it is better in terms of surface area. Required debridement. A lot of pain in the posterior right Achilles marked tenderness. Usually this type of presentation this patient gives concern for an active cellulitis 05/12/17; patient arrives today with his major wound from last week on the right lateral leg somewhat better. Still requiring debridement. He was using his compression stocking on the left leg however that is  reopened with superficial wounds anteriorly he did not have an open wound on this leg previously. He is still using his juxta light's once daily at night. He cannot find the time to do this in the morning as he has to be at work by 7 AM 05/19/17; right lateral leg wound looks improved. No debridement required. The concerning area is on the left posterior leg which appears to almost have a subcutaneous hemorrhagic component to it. We've been using silver alginate to all the wounds 05/26/17; the right lateral leg wound continues to look improved. However the area on the left posterior calf is a tightly adherent surface. [place] using silver alginate. Because of the weeping edema in his legs there is very little good alternatives. 06/02/17; the patient left here last week looking quite good. Major wound on the left posterior calf and a small one on the right lateral calf. Both of these look satisfactory. He tells me that by Wednesday he had noted increased pain in the left leg and drainage. He called on Thursday and Friday to get an appointment here but we were blocked. He did not go to urgent care or his primary physician. He thinks he had a fever on Thursday but did not actually take his temperature. He has not been using his compression pumps on the left leg because of pain. I advised him to go to the emergency room today for IV antibiotics for stents of left leg cellulitis but he has refused I  have asked him to take 2 days off work to keep his leg elevated and he has refused this as well. In view of this I'm going to call him and Augmentin and doxycycline. 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 However after a detailed discussion regarding the risk benefits and  alternatives he refuses to get admitted to the hospital. With no other recourse I will continue him on oral antibiotics as before and hopefully he'll have his infectious disease consultation this week. 06/16/2017 -- the patient was seen today by the nurse practitioner at infectious disease Ms. Dixon. 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 He has also been advise Lotrimin cream locally. she also had wise IV antibiotics for his condition if it flares up 06/23/17; patient arrives today with drainage bilaterally although the remaining wound on the left posterior calf after cleaning up today "highlighter yellow drainage" did not look too bad. Unfortunately he has had breakdown on the right anterior leg [previously this leg had not been open and he is using a black stocking] he went to see infectious disease and is been put on clindamycin 150 mg daily, I did not verify the dose although I'm not familiar with using clindamycin in this dosing range, perhaps for prophylaxisoo 06/27/17; I brought this patient back today to follow-up on the wound deterioration on the right lower leg together with surrounding cellulitis. I started him on doxycycline 4 days ago. This area looks better however he comes in today with intense cellulitis on the medial part of his left thigh. This is not have a wound in this area. Extremely tender. We've been using silver alginate to the wounds on the right lower leg left lower leg with bilateral 4 layer compression he is using his external compression pumps once a day 07/04/17; patient's left medial thigh cellulitis looks better. He has not been using his compression pumps as his insert said it was contraindicated with cellulitis. His right leg continues to make improvements all the wounds are still open.  We only have one remaining wound on the left posterior calf. Using silver alginate to all open areas. He is on doxycycline which I started a week ago and should be finishing I gave him Augmentin after Thursday's visit for the severe cellulitis on the left medial thigh which fortunately looks better 07/14/17; the patient's left medial thigh cellulitis has resolved. The cellulitis in his right lower calf on the right also looks better. All of his wounds are stable to improved we've been using silver alginate he has completed the antibiotics I have given him. He has clindamycin 150 mg once a day prescribed by infectious disease for prophylaxis, I've advised him to start this now. We have been using bilateral Unna boots over silver alginate to the wound areas 07/21/17; the patient is been to see infectious disease who noted his recurrent problems with cellulitis. He was not able to tolerate prophylactic clindamycin therefore he is on amoxicillin 500 twice a day. He also had a second daily dose of Lasix added By Dr. Oneta Rack but  he is not taking this. Nor is he being completely compliant with his compression pumps a especially not this week. He has 2 remaining wounds one on the right posterior lateral lower leg and one on the left posterior medial lower leg. 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 The patient has Unna boots bilaterally. 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 He has bilateral Unna boots for compression and silver alginate to his wounds. Arrives today with his legs looking as good as I have seen him in quite some time. Not surprisingly his wounds look better as well with improvement on the right lateral leg venous insufficiency wound and also the left medial leg. He  is still using the compression pumps once a day 08/11/17; both legs appear to be doing better wounds on the right lateral and left medial legs look better. Skin on the right leg quite good. He is been using silver alginate as the primary dressing. I'm going to use Anasept gel calcium alginate and maintain all the secondary dressings 08/18/17; the patient continues to actually do quite well. The area on his right lateral leg is just about closed the left medial also looks better although it is still moist in this area. His edema is well controlled we have been using Anasept gel with calcium alginate and the usual secondary dressings, 4 layer compression and once daily use of his compression pumps "always been able to manage 09/01/17; the patient continues to do reasonably well in spite of his trip to [place]. The area on the right lateral leg is epithelialized. Left is much better but still open. He has more edema and more chronic erythema on the left leg [venous inflammation] 09/08/17; he arrives today with no open wound on the right lateral leg and decently controlled edema. Unfortunately his left leg is not nearly as in his good situation as last week.he apparently had increasing edema starting on Saturday. He edema soaked through into his foot so used a plastic bag to walk around his home. The area on the medial right leg which was his open area is about the same however he has lost surface epithelium on the left lateral which is new and he has significant pain in the Achilles area of the left foot. He is already on amoxicillin chronically for prophylaxis of cellulitis in the left leg 09/15/17; he is completed a week of doxycycline and the cellulitis in the left posterior leg and Achilles area is as usual improved. He still has a lot of edema and fluid soaking through his dressings. There is no open wound on the right leg. He saw infectious disease NP today 09/22/17;As usual 1 we transition him  from our compression wraps to his stockings things did not go well. He has several small open areas on the right leg. He states this was caused by the compression wrap on his skin although he did not wear this with the stockings over them. He has several superficial areas on the left leg medially laterally posteriorly. He does not have any evidence of active cellulitis especially involving the left Achilles The patient is traveling from [facility] Saturday going to [facility] He states he isn't attempting to get an appointment with a heel objects wound center there to change his  dressings. I am not completely certain whether this will work 10/06/17; the patient came in on Friday for a nurse visit and the nurse reported that his legs actually look quite good. He arrives in clinic today for his regular follow-up visit. He has a new wound on his left third toe over the PIP probably caused by friction with his footwear. He has small areas on the left leg and a very superficial but epithelialized area on the right anterior lateral lower leg. Other than that his legs look as good as I've seen him in quite some time. We have been using silver alginate Review of systems; no chest pain no shortness of breath other than this a 10 point review of systems negative 10/20/17; seen by Dr. Meyer Russel last week. He had taken some antibiotics [doxycycline] that he had left over. Dr. Meyer Russel thought he had candida infection and declined to give him further antibiotics. He has a small wound remaining on the right lateral leg several areas on the left leg including a larger area on the left posterior several left medial and anterior and a small wound on the left lateral. The area on the left dorsal third toe looks a lot better. ROS; Gen.; no fever, respiratory no cough no sputum Cardiac no chest pain other than this 10 point review of system is negative 10/30/17; patient arrives today having fallen in the bathtub 3 days ago.  It took him a while to get up. He has pain and maceration in the wounds on his left leg which have deteriorated. He has not been using his pumps he also has some maceration on the right lateral leg. 11/03/17; patient continues to have weeping edema especially in the left leg. This saturates his dressings which were just put on on 12/27. As usual the doxycycline seems to take care of the cellulitis on his lower leg. He is not complaining of fever, chills, or other systemic symptoms. He states his leg feels a lot better on the doxycycline I gave him empirically. He also apparently gets injections at his primary doctor's officeo Rocephin for cellulitis prophylaxis. I didn't ask him about his compression pump compliance today I think that's probably marginal. Arrives in the clinic with all of his dressings primary and secondary macerated full of fluid and he has bilateral edema 11/10/17; the patient's right leg looks some better although there is still a cluster of wounds on the right lateral. The left leg is inflamed with almost circumferential skin loss medially to laterally although we are still maintaining anteriorly. He does not have overt cellulitis there is a lot of drainage. He is not using compression pumps. We have been using silver alginate to the wound areas, there are not a lot of options here 11/17/17; the patient's right leg continues to be stable although there is still open wounds, better than last week. The inflammation in the left leg is better. Still loss of surface layer epithelium especially posteriorly. There is no overt cellulitis in the amount of edema and his left leg is really quite good, tells me he is using his compression pumps once a day. 11/24/17; patient's right leg has a small superficial wound laterally this continues to improve. The inflammation in the left leg is still improving however we have continuous surface layer epithelial loss posteriorly. There is no overt  cellulitis in the amount of edema in both legs is really quite good. He states he is using his compression pumps on the left leg once  a day for 5 out of 7 days 12/01/17; very small superficial areas on the right lateral leg continue to improve. Edema control in both legs is better today. He has continued loss of surface epithelialization and left posterior calf although I think this is better. We have been using silver alginate with large number of absorptive secondary dressings 4 layer on the left Unna boot on the right at his request. He tells me he is using his compression pumps once a day 12/08/17; he has no open area on the right leg is edema control is good here. ooOn the left leg however he has marked erythema and tenderness breakdown of skin. He has what appears to be a wrap injury just distal to the popliteal fossa. 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 There was swelling erythema and breakdown in the right leg. He has much worse skin breakdown in the left leg as well multiple open areas medially and posteriorly as well as laterally. He tells me he has been using his compression pumps but tells me he feels that the drainage out of his leg is worse when he uses a compression pumps. T be fair to him he is been saying this o for a while however I don't know that I have really been listening to this. I wonder if the compression pumps are working properly 12/22/17;. Once again he arrives with severe erythema, weeping edema from the left greater than right leg. Noncompliance with compression pumps. New this visit he is complaining of pain on the lateral aspect of the right leg and the medial aspect of his right thigh. He  apparently saw his cardiologist Dr. [name] who was ordered an echocardiogram area and I think this is a step in the right direction 12/25/17; started his doxycycline Monday night. There is still intense erythema of the right leg especially in the anterior thigh although there is less tenderness. The erythema around the wound on the right lateral calf also is less tender. He still complaining of pain in the left heel. His wounds are about the same right lateral left medial left lateral. Superficial but certainly not close to closure. He denies being systemically unwell no fever chills no abdominal pain no diarrhea 12/29/17; back in follow-up of his extensive right calf and right thigh cellulitis. I added amoxicillin to cover possible doxycycline resistant strep. This seems to of done the trick he is in much less pain there is much less erythema and swelling. He has his echocardiogram at 11:00 this morning. X-ray of the left heel was also negative. 01/05/18; the patient arrived with his edema under much better control. Now that he is retired he is able to use his compression pumps daily and sometimes twice a day per the patient. He has a wound on the right leg the lateral wound looks better. Area on the left leg also looks a lot better. He has no evidence of cellulitis in his bilateral thighs I had a quick peak at his echocardiogram. He is in normal ejection fraction and normal left ventricular function. He has moderate pulmonary hypertension moderately reduced right ventricular function. One would have to wonder about chronic sleep apnea although he says he doesn't snore. He'll review  the echocardiogram with his cardiologist. 01/12/18; the patient arrives with the edema in both legs under exemplary control. He is using his compression pumps daily and sometimes twice daily. His wound on the right lateral leg is just about closed. He still has some weeping areas on the posterior left calf and lateral left calf  although everything is just about closed here as well. I have spoken with Aldean Baker who is the patient's nurse practitioner and infectious disease. She was concerned that the patient had not understood that the parenteral penicillin injections he was receiving for cellulitis prophylaxis was actually benefiting him. I don't think the patient actually saw that I would tend to agree we were certainly dealing with less infections although he had a serious one last month. 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 He is healed. He'll be placed in juxtalite compression wraps and increase his lymphedema pumps to twice daily. We will follow up again next week to ensure there are no issues with the new regiment. 01/20/18-he is here for evaluation of bilateral lower extremity weeping edema. Yesterday he was placed in compression wrap to the right lower extremity and compression stocking to left lower shrubbery. He states he uses lymphedema pumps last night and again this morning and noted a blister to the left lower extremity. On exam he was noted to have drainage to the right lower extremity. He will be placed in Unna boots bilaterally and follow-up next week 01/26/18; patient was actually discharged a week ago to his own juxta light stockings only to return the next day with bilateral lower extremity weeping edema.he was placed in bilateral Unna boots. He arrives today with pain in the back of his left leg. There is no open area on the right leg however there is a linear/wrap injury on the left leg and weeping edema on the left leg posteriorly. I spoke with infectious disease about 10 days ago. They were disappointed that the patient elected to discontinue prophylactic intramuscular penicillin shots as they felt it was particularly beneficial in reducing the frequency of his cellulitis. I discussed this with the patient today. He does not share this view. He'll  definitely need antibiotics today. Finally he is traveling to [place] and trauma leaving this Saturday and returning a week later and he does not travel with his pumps. He is going by car 01/30/18; patient was seen 4 days ago and brought back in today for review of cellulitis in the left leg posteriorly. I put him on amoxicillin this really hasn't helped as much as I might like. He is also worried because he is traveling to [facility] trauma by car. Finally we will be rewrapping him. There is no open area on the right leg over his left leg has multiple weeping areas as usual 02/09/18; The same wrap on for 10 days. He did not pick up the last doxycycline I prescribed for him. He apparently took 4 days worth he already had. There is nothing open on his right leg and the edema control is really quite good. He's had damage in the left leg medially and laterally especially probably related to the prolonged use of Unna boots 02/12/18; the patient arrived in clinic today for a nurse visit/wrap change. He complained of a lot of pain in the left posterior calf. He is taking doxycycline that I previously prescribed for him. Unfortunately even though he used his stockings and apparently used to compression pumps twice a day he  has weeping edema coming out of the lateral part of his right leg. This is coming from the lower anterior lateral skin area. 02/16/18; the patient has finished his doxycycline and will finish the amoxicillin 2 days. The area of cellulitis in the left calf posteriorly has resolved. He is no longer having any pain. He tells me he is using his compression pumps at least once a day sometimes twice. 02/23/18; the patient finished his doxycycline and Amoxil last week. On Friday he noticed a small erythematous circle about the size of a quarter on the left lower leg just above his ankle. This rapidly expanded and he now has erythema on the lateral and posterior part of the thigh. This is bright red.  Also has an area on the dorsal foot just above his toes and a tender area just below the left popliteal fossa. He came off his prophylactic penicillin injections at his own insistence one or 2 months ago. This is obviously deteriorated since then 03/02/18; patient is on doxycycline and Amoxil. Culture I did last week of the weeping area on the back of his left calf grew group B strep. I have therefore renewed the amoxicillin 500 3 times a day for a further week. He has not been systemically unwell. Still complaining of an area of discomfort right under his left popliteal fossa. There is no open wound on the right leg. He tells me that he is using his pumps twice a day on most days 03/09/18; patient arrives in clinic today completing his amoxicillin today. The cellulitis on his left leg is better. Furthermore he tells me that he had intramuscular penicillin shots that his primary care office today. However he also states that the wrap on his right leg fell down shortly after leaving clinic last week. He developed a large blister that was present when he came in for a nurse visit later in the week and then he developed intense discomfort around this area.He tells me he is using his compression pumps 03/16/18; the patient has completed his doxycycline. The infectious part of this/cellulitis in the left heel area left popliteal area is a lot better. He has 2 open areas on the right calf. Still areas on the left calf but this is a lot better as well. 03/24/18; the patient arrives complaining of pain in the left popliteal area again. He thinks some of this is wrap injury. He has no open area on the right leg and really no open area on the left calf either except for the popliteal area. He claims to be compliant with the compression pumps 03/31/18; I gave him doxycycline last week because of cellulitis in the left popliteal area. This is a lot better although the surface epithelium is denuded off and response to  this. He arrives today with uncontrolled edema in the right calf area as well as a fingernail injury in the right lateral calf. There is only a few open areas on the left 04/06/18; I gave him amoxicillin doxycycline over the last 2 weeks that the amoxicillin should be completing currently. He is not complaining of any pain or systemic symptoms. The only open areas see has is on the right lateral lower leg paradoxically I cannot see anything on the left lower leg. He tells me he is using his compression pumps twice a day on most days. Silver alginate to the wounds that are open under 4 layer compression 04/13/18; he completed antibiotics and has no new complaints. Using his compression  pumps. Silver alginate that anything that's opened 04/20/18; he is using his compression pumps religiously. Silver alginate 4 layer compression anything that's opened. He comes in today with no open wounds on the left leg but 3 on the right including a new one posteriorly. He has 2 on the right lateral and one on the right posterior. He likes Unna boots on the right leg for reasons that aren't really clear we had the usual 4 layer compression on the left. It may be necessary to move to the 4 layer compression on the right however for now I left them in the Unna boots 04/27/18; he is using his compression pumps at least once a day. He has still the wounds on the right lateral calf. The area right posteriorly has closed. He does not have an open wound on the left under 4 layer compression however on the dorsal left foot just proximal to the toes and the left third toe 2 small open areas were identified 05/11/18; he has not uses compression pumps. The areas on the right lateral calf have coalesced into one large wound necrotic surface. On the left side he has one small wound anteriorly however the edema is now weeping out of a large part of his left leg. He says he wasn't using his pumps because of the weeping fluid. I explained  to him that this is the time he needs to pump more 05/18/18; patient states he is using his compression pumps twice a day. The area on the right lateral large wound albeit superficial. On the left side he has innumerable number of small new wounds on the left calf particularly laterally but several anteriorly and medially. All these appear to have healthy granulated base these look like the remnants of blisters however they occurred under compression. The patient arrives in clinic today with his legs somewhat better. There is certainly less edema, less multiple open areas on the left calf and the right anterior leg looks somewhat better as well superficial and a little smaller. However he relates pain and erythema over the last 3-4 days in the thigh and I looked at this today. He has not been systemically unwell no fever no chills no change in blood sugar values 05/25/18; comes in today in a better state. The severe cellulitis on his left leg seems better with the Keflex. Not as tender. He has not been systemically unwell ooHard to find an open wound on the left lower leg using his compression pumps twice a day ooThe confluent wounds on his right lateral calf somewhat better looking. These will ultimately need debridement I didn't do this today. 06/01/18; the severe cellulitis on the left anterior thigh has resolved and he is completed his Keflex. ooThere is no open wound on the left leg however there is a superficial excoriation at the base of the third toe dorsally. Skin on the bottom of his left foot is macerated looking. ooThe left the wounds on the lateral right leg actually looks some better although he did require debridement of the top half of this wound area with an open curet 06/09/18 on evaluation today patient appears to be doing poorly in regard to his right lower extremity in particular this appears to likely be infected he has very thick purulent discharge along with a bright green tent  to the discharge. This makes me concerned about the possibility of pseudomonas. He's also having increased discomfort at this point on evaluation. Fortunately there does not appear to be  any evidence of infection spreading to the other location at this time. 06/16/18 on evaluation today patient appears to actually be doing fairly well. His ulcer has actually diminished in size quite significantly at this point which is good news. Nonetheless he still does have some evidence of infection he did see infectious disease this morning before coming here for his appointment. I did review the results of their evaluation and their note today. They did actually have him discontinue the Cipro and initiate treatment with linezolid at this time. He is doing this for the next seven days and they recommended a follow-up in four months with them. He is the keep a log of the need for intermittent antibiotic therapy between now and when he falls back up with infectious disease. This will help them gaze what exactly they need to do to try and help them out. 06/23/18; the patient arrives today with no open wounds on the left leg and left third toe healed. He is been using his compression pumps twice a day. On the right lateral leg he still has a sizable wound but this is a lot better than last time I saw this. 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 Has been using silver alginate under 4 layer compression 06/30/18; the only open wound he has is on the right lateral leg and this looks healthy. No debridement is required. We have been using silver alginate. He does not have an open wound on the left leg. There is apparently some drainage from the dorsal proximal third toe on the left although I see no open wound here. 07/03/18 on evaluation today patient was actually here just for a nurse visit rapid change. However when he  was here on Wednesday for his rat change due to having been healed on the left and then developing blisters we initiated the wrap again knowing that he would be back today for [place] to reevaluate and see were at. Unfortunately he has developed some cellulitis into the proximal portion of his right lower extremity even into the region of his thigh. 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 He was placed on one as what at that point. Nonetheless he is done with that and has been tolerating it well otherwise. Doxycycline which in the past really did not seem to be effective for him. Nonetheless I think the best option may be for [place] to definitely reinitiate the antibiotics for a longer period of time. 07/07/18; since I last saw this patient a week ago he has had a difficult time. At that point he did not have an open wound on his left leg. We transitioned him into juxta light stockings. He was apparently in the clinic the next day with blisters on the left lateral and left medial lower calf. He also had weeping edema fluid. He was put back into a compression wrap. He was also in the clinic on Friday with intense erythema in his right thigh. Per the patient he was started on Bactrim however that didn't work at all in terms of relieving his pain and swelling. He has taken 3 doxycycline that he had left over from last time and that seems to of helped. He has blistering on the right thigh as well. 07/14/18; the erythema on his right thigh has gotten better with doxycycline that he is  finishing. 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 Presumably this cellulitis in the thigh was not related to that although I think this is a bit concerning going forward. He still has an area on the right lateral calf the blister on the right medial calf just below the knee that was discussed above. On the left 2  small open areas left medial and left lateral. Edema control is adequate. He is using his compression pumps twice a day 07/20/18; continued improvement in the condition of both legs especially the edema in his bilateral thighs. He tells me he is been losing weight through a combination of diet and exercise. He is using his compression pumps twice a day. So overall she made to the remaining wounds 07/27/2018; continued improvement in condition of both legs. His edema is well controlled. The area on the right lateral leg is just about closed he had one blisters show up on the medial left upper calf. We have him in 4 layer compression. He is going on a 10-day trip to [place], [place]. He will be driving. He wants to wear Unna boots because of the lessening amount of constriction. He will not use compression pumps while he is away 08/05/18 on evaluation today patient actually appears to be doing decently well all things considered in regard to his bilateral lower extremities. The worst ulcer is actually only posterior aspect of his left lower extremity with a four layer compression wrap cut into his leg a couple weeks back. He did have a trip and actually had [employer] for the trip that he is worn since he was last here. Nonetheless he feels like the [employer] actually do better for him his swelling is up a little bit but he also with his trip was not taking his Lasix on a regular set schedule like he was supposed to be. He states that obviously the reason being that he cannot drive and keep going without having to urinate too frequently which makes it difficult. He did not have his pumps with him while he was away either which I think also maybe playing a role here too. 08/13/2018; the patient only has a small open wound on the right lateral calf which is a big improvement in the last month or 2. He also has the area posteriorly just below the posterior fossa on the left which  I think was a wrap injury from several weeks ago. He has no current evidence of cellulitis. He tells me he is back into his compression pumps twice a day. He also tells me that while he was at the laundromat somebody stole a section of his extremitease stockings 08/20/2018; back in the clinic with a much improved state. He only has small areas on the right lateral mid calf which is just about healed. This was is more substantial area for quite a prolonged period of time. He has a small open area on the left anterior tibia. The area on the posterior calf just below the popliteal fossa is closed today. He is using his compression pumps twice a day 08/28/2018; patient has no open wound on the right leg. He has a smattering of open areas on the calf with some weeping lymphedema. More problematically than that it looks as though his wraps of slipped down in his usual he has very angry upper area of edema just  below the right medial knee and on the right lateral calf. He has no open area on his feet. The patient is traveling to [facility] next week. I will send him in an antibiotic. We will continue to wrap the right leg. We ordered extremitease stockings for him last week and I plan to transition the right leg to a stocking when he gets home which will be in 10 days time. As usual he is very reluctant to take his pumps with him when he travels 09/07/2018; patient returns from [facility]. He shows me a picture of his left leg in the mid part of his trip last week with intense fire engine erythema. The picture look bad enough I would have considered sending him to the hospital. Instead he went to the wound care center in [facility]. They did not prescribe him antibiotics but he did take some doxycycline he had leftover from a previous visit. I had given him trimethoprim sulfamethoxazole before he left this did not work according to the patient. This is resulted in some improvement  fortunately. He comes back with a large wound on the left posterior calf. Smaller area on the left anterior tibia. Denuded blisters on the dorsal left foot over his toes. Does not have much in the way of wounds on the right leg although he does have a very tender area on the right posterior area just below the popliteal fossa also suggestive of infection. He promises me he is back on his pumps twice a day 09/15/2018; the intense cellulitis in his left lower calf is a lot better. The wound area on the posterior left calf is also so better. However he has reasonably extensive wounds on the dorsal aspect of his second and third toes and the proximal foot just at the base of the toes. There is nothing open on the right leg 09/22/2018; the patient has excellent edema control in his legs bilaterally. He is using his external compression pumps twice a day. He has no open area on the right leg and only the areas in the left foot dorsally second and third toe area on the left side. He does not have any signs of active cellulitis. 10/06/2018; the patient has good edema control bilaterally. He has no open wound on the right leg. There is a blister in the posterior aspect of his left calf that we had to deal with today. He is using his compression pumps twice a day. There is no signs of active cellulitis. We have been using silver alginate to the wound areas. He still has vulnerable areas on the base of his left first second toes dorsally He has a his extremities stockings and we are going to transition him today into the stocking on the right leg. He is cautioned that he will need to continue to use the compression pumps twice a day. If he notices uncontrolled edema in the right leg he may need to go to 3 times a day. 10/13/2018; the patient came in for a nurse check on Friday he has a large flaccid blister on the right medial calf just below the knee. We unroofed this. He has this and a new area underneath the  posterior mid calf which was undoubtedly a blister as well. He also has several small areas on the right which is the area we put his extremities stocking on. 10/19/2018; the patient went to see infectious disease this morning I am not sure if that was a  routine follow-up in any case the doxycycline I had given him was discontinued and started on linezolid. He has not started this. It is easy to look at his left calf and the inflammation and think this is cellulitis however he is very tender in the tissue just below the popliteal fossa and I have no doubt that there is infection going on here. He states the problem he is having is that with the compression pumps the edema goes down and then starts walking the wrap falls down. We will see if we can adhere this. He has 1 or 2 minuscule open areas on the right still areas that are weeping on the posterior left calf, the base of his left second and third toes 10/26/18; back today in clinic with quite of skin breakdown in his left anterior leg. This may have been infection the area below the popliteal fossa seems a lot better however tremendous epithelial loss on the left anterior mid tibia area over quite inexpensive tissue. He has 2 blisters on the right side but no other open wound here. 10/29/2018; came in urgently to see [place] today and we worked him in for review. He states that the 4 layer compression on the right leg caused pain he had to cut it down to roughly his mid calf this caused swelling above the wrap and he has blisters and skin breakdown today. As a result of the pain he has not been using his pumps. Both legs are a lot more edematous and there is a lot of weeping fluid. 11/02/18; arrives in clinic with continued difficulties in the right leg> left. Leg is swollen and painful. multiple skin blisters and new open areas especially laterally. He has not been using his pumps on the right leg. 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 He is not systemically unwell. 11/09/2018; the patient claims he is being compliant with his pumps. He is finished the doxycycline I gave him last week. Culture I did of the wound on the right lateral leg showed a few very resistant methicillin staph aureus. This was resistant to doxycycline. Nevertheless he states the pain in the leg is a lot better which makes me wonder if the cultured organism was not really what was causing the problem nevertheless this is a very dangerous organism to be culturing out of any wound. His right leg is still a lot larger than the left. He is using an Radio broadcast assistant on this area, he blames a 4-layer compression for causing the original skin breakdown which I doubt is true however I cannot talk him out of it. We have been using silver alginate to all of these areas which were initially blisters 11/16/2018; patient is being compliant with his external compression pumps at twice a day. Miraculously he arrives in clinic today with absolutely no open wounds. He has better edema control on the left where he has been using 4 layer compression versus wound of wounds on the right and I pointed this out to him. There is no inflammation in the skin in his lower legs which is also somewhat unusual for him. There is no open wounds on the dorsal left foot. He has extremitease stockings at home and I have asked him to bring these in next week. 11/25/18 patient's lower extremity on examination today on the left appears for the most part to be wound free. He does have an open wound on the lateral aspect of  the right lower extremity but this is minimal compared to what I've seen in past. He does request that we go ahead and wrap the left leg as well even though there's nothing open just so hopefully it will not reopen in short order. 1/28; patient has superficial open wounds on the right lateral calf left anterior calf and left posterior calf. His edema  control is adequate. 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 We have been using silver alginate as the primary dressing. He claims compliance with his compression pumps 2/4; patient has superficial open wounds on numerous areas of his left calf and again one on the left dorsal foot. The areas on the right lateral calf have healed. The cellulitis that I gave him doxycycline for last week is also resolved this was mostly on the left anterior calf just below the tibial tuberosity. His edema looks fairly well-controlled. He tells me he went to see his primary doctor today and had blood work ordered 2/11; once again he has several open areas on the left calf left tibial area. Most of these are small and appear to have healthy granulation. He does not have anything open on the right. The edema and control in his thighs is pretty good which is usually a good indication he has been using his pumps as requested. 2/18; he continues to have several small areas on the left calf and left tibial area. Most of these are small healthy granulation. We put him in his stocking on the right leg last week and he arrives with a superficial open area over the right upper tibia and a fairly large area on the right lateral tibia in similar condition. His edema control actually does not look too bad, he claims to be using his compression pumps twice a day 2/25. Continued small areas on the left calf and left tibial area. New areas especially on the right are identified just below the tibial tuberosity and on the right upper tibia itself. There are also areas of weeping edema fluid even without an obvious wound. He does not have a considerable degree of lymphedema but clearly there is more edema here than his skin can handle. He states he is using the pumps twice a day. We have an Unna boot on the right and 4 layer compression on the  left. 3/3; he continues to have an area on the right lateral calf and right posterior calf just below the popliteal fossa. There is a fair amount of tenderness around the wound on the popliteal fossa but I did not see any evidence of cellulitis, could just be that the wrap came down and rubbed in this area. ooHe does not have an open area on the left leg however there is an area on the left dorsal foot at the base of the third toe ooWe have been using silver alginate to all wound areas 3/10; he did not have an open area on his left leg last time he was here a week ago. T oday he arrives with a horizontal wound just below the tibial tuberosity and an area on the left lateral calf. He has intense erythema and tenderness in this area. The area is on the right lateral calf and right posterior calf better than last week. We have been using silver alginate as usual 3/18 - Patient returns with 3 small open areas on left calf, and 1 small  open area on right calf, the skin looks ok with no significant erythema, he continues the UNA boot on right and 4 layer compression on left. The right lateral calf wound is closed , the right posterior is small area. we will continue silver alginate to the areas. 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 He is been tolerating the dressing changes without complication. Fortunately she has made excellent improvement in regard to the overall status of his wounds. Unfortunately every time we cease wrapping him he ends up reopening in causing more significant issues at that point. Again I'm unsure of the best direction to take although I think the lymphedema clinic may be appropriate for him. 02/03/19 on evaluation today patient appears to be doing well in regard to the wounds that we  saw him for last week unfortunately he has a new area on the proximal portion of his right medial/posterior lower extremity where the wrap somewhat slowed down and caused swelling and a blister to rub and open. Unfortunately this is the only opening that he has on either leg at this point. 02/17/19 on evaluation today patient's bilateral lower extremities appear to be doing well. He still completely healed in regard to the left lower extremity. In regard to the right lower extremity the area where the wrap and slid down and caused the blister still seems to be slightly open although this is dramatically better than during the last evaluation two weeks ago. I'm very pleased with the way this stands overall. 03/03/19 on evaluation today patient appears to be doing well in regard to his right lower extremity in general although he did have a new blister open this does not appear to be showing any evidence of active infection at this time. Fortunately there's No fevers, chills, nausea, or vomiting noted at this time. Overall I feel like he is making good progress it does feel like that the right leg will we perform the [employer] seems to do with a bit better than three layer wrap on the left which slid down on him. We may switch to doing bilateral in the book wraps. 5/4; I have not seen Mr. Royle in quite some time. According to our case manager he did not have an open wound on his left leg last week. He had 1 remaining wound on the right posterior medial calf. He arrives today with multiple openings on the left leg probably were blisters and/or wrap injuries from Unna boots. I do not think the Unna boot's will provide adequate compression on the left. I am also not clear about the frequency he is using the compression pumps. 03/17/19 on evaluation today patient appears to be doing excellent in regard to his lower extremities compared to last week's evaluation apparently. He had gotten significantly worse  last week which is unfortunate. The [employer] wrap on the left did not seem to do very well for him at all and in fact it didn't control his swelling significantly enough he had an additional outbreak. Subsequently we go back to the four layer compression wrap on the left. This is good news. At least in that he is doing better and the wound seem to be killing him. He still has not heard anything from the lymphedema clinic. 03/24/19 on evaluation today patient actually appears  to be doing much better in regard to his bilateral lower Trinity as compared to last week when I saw him. Fortunately there's no signs of active infection at this time. He has been tolerating the dressing changes without complication. Overall I'm extremely pleased with the progress and appearance in general. 04/07/19 on evaluation today patient appears to be doing well in regard to his bilateral lower extremities. His swelling is significantly down from where it was previous. With that being said he does have a couple blisters still open at this point but fortunately nothing that seems to be too severe and again the majority of the larger openings has healed at this time. 04/14/19 on evaluation today patient actually appears to be doing quite well in regard to his bilateral lower extremities in fact I'm not even sure there's anything significantly open at this time at any site. Nonetheless he did have some trouble with these wraps where they are somewhat irritating him secondary to the fact that he has noted that the graph wasn't too close down to the end of this foot in a little bit short as well up to his knee. Otherwise things seem to be doing quite well. 04/21/19 upon evaluation today patient's wound bed actually showed evidence of being completely healed in regard to both lower extremities which is excellent news. There does not appear to be any signs of active infection which is also good news. I'm very pleased in this regard. No  fevers, chills, nausea, or vomiting noted at this time. 04/28/19 on evaluation today patient appears to be doing a little bit worse in regard to both lower extremities on the left mainly due to the fact that when he went infection disease the wrap was not wrapped quite high enough he developed a blister above this. On the right he is a small open area of nothing too significant but again this is continuing to give him some trouble he has been were in the Velcro compression that he has at home. 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 He's been tolerating the dressing changes without complication. Fortunately there's no signs of active infection at this time. No fevers, chills, nausea, or vomiting noted at this time. We have been trying to get an appointment with her lymphedema clinic in [facility] but unfortunately nobody can get them on phone with not been able to even fax information over the patient likewise is not been able to get in touch with them. Overall I'm not sure exactly what's going on here with to reach out again today. 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 Still having a lot of drainage unfortunately. He tells me especially in the left but even on the right. There's no signs of active infection which is good news we've been using so ratcheted up to this point. 05/19/19 on evaluation today patient actually appears to be doing quite well with regard to his left lower extremity which is great news. Fortunately in regard to the right lower extremity has an issues with his wrap and he subsequently did remove this from what I'm understanding. Nonetheless long story short is what he had rewrapped once he removed it subsequently had maggots underneath this wrap whenever he came in for evaluation today. With that being said  they were obviously completely cleaned away by the nursing staff. The visit  today which is excellent news. However he does appear to potentially have some infection around the right ankle region where the maggots were located as well. He will likely require anabiotic therapy today. 05/26/19 on evaluation today patient actually appears to be doing much better in regard to his bilateral lower extremities. I feel like the infection is under much better control. With that being said there were maggots noted when the wrap was removed yet again today. Again this could have potentially been left over from previous although at this time there does not appear to be any signs of significant drainage there was obviously on the wrap some drainage as well this contracted gnats or otherwise. Either way I do not see anything that appears to be doing worse in my pinion and in fact I think his drainage has slowed down quite significantly likely mainly due to the fact to his infection being under better control. 06/02/2019 on evaluation today patient actually appears to be doing well with regard to his bilateral lower extremities there is no signs of active infection at this time which is great news. With that being said he does have several open areas more so on the right than the left but nonetheless these are all significantly better than previously noted. 06/09/2019 on evaluation today patient actually appears to be doing well. His wrap stayed up and he did not cause any problems he had more drainage on the right compared to the left but overall I do not see any major issues at this time which is great news. 06/16/2019 on evaluation today patient appears to be doing excellent with regard to his lower extremities the only area that is open is a new blister that can have opened as of today on the medial ankle on the left. Other than this he really seems to be doing great I see no major issues at this point. 06/23/2019 on  evaluation today patient appears to be doing quite well with regard to his bilateral lower extremities. In fact he actually appears to be almost completely healed there is a small area of weeping noted of the right lower extremity just above the ankle. Nonetheless fortunately there is no signs of active infection at this time which is good news. No fevers, chills, nausea, vomiting, or diarrhea. 8/24; the patient arrived for a nurse visit today but complained of very significant pain in the left leg and therefore I was asked to look at this. Noted that he did not have an open area on the left leg last week nevertheless this was wrapped. The patient states that he is not been able to put his compression pumps on the left leg because of the discomfort. He has not been systemically unwell 06/30/2019 on evaluation today patient unfortunately despite being excellent last week is doing much worse with regard to his left lower extremity today. In fact he had to come in for a nurse on Monday where his left leg had to be rewrapped due to excessive weeping Dr. Leanord Hawking placed him on doxycycline at that point. Fortunately there is no signs of active infection Systemically at this time which is good news. 07/07/2019 in regard to the patient's wounds today he actually seems to be doing well with his right lower extremity there really is nothing open or draining at this point this is great news. Unfortunately the left lower extremity is given him additional trouble at this time. There does not appear to be any signs of active infection  nonetheless he does have a lot of edema and swelling noted at this point as well as blistering all of which has led to a much more poor appearing leg at this time compared to where it was 2 weeks ago when it was almost completely healed. Obviously this is a little discouraging for the patient. He is try to contact the lymphedema clinic in [place] he has not been able to get through to  them. 07/14/2019 on evaluation today patient actually appears to be doing slightly better with regard to his left lower extremity ulcers. Overall I do feel like at least at the top of the wrap that we have been placing this area has healed quite nicely and looks much better. The remainder of the leg is showing signs of improvement. Unfortunately in the thigh area he still has an open region on the left and again on the right he has been utilizing just a Band-Aid on an area that also opened on the thigh. Again this is an area that were not able to wrap although we did do an Ace wrap to provide some compression that something that obviously is a little less effective than the compression wraps we have been using on the lower portion of the leg. He does have an appointment with the lymphedema clinic in [facility] on Friday. 07/21/2019 on evaluation today patient appears to be doing better with regard to his lower extremity ulcers. He has been tolerating the dressing changes without complication. Fortunately there is no signs of active infection at this time. No fevers, chills, nausea, vomiting, or diarrhea. I did receive the paperwork from the physical therapist at the lymphedema clinic in [place]. Subsequently I signed off on that this morning and sent that back to him for further progression with the treatment plan. 07/28/2019 on evaluation today patient appears to be doing very well with regard to his right lower extremity where I do not see any open wounds at this point. Fortunately he is feeling great as far as that is concerned as well. In regard to the left lower extremity he has been having issues with still several areas of weeping and edema although the upper leg is doing better his lower leg still I think is going require the compression wrap at this time. No fevers, chills, nausea, vomiting, or diarrhea. 08/04/2019 on evaluation today patient unfortunately is having new wounds on the  right lower extremity. Again we have been using Unna boot wrap on that side. We switched him to using his juxta lite wrap at home. With that being said he tells me he has been using it although his legs extremely swollen and to be honest really does not appear that he has been. I cannot know that for sure however. Nonetheless he has multiple new wounds on the right lower extremity at this time. Obviously we will have to see about getting this rewrapped for him today. 08/11/2019 on evaluation today patient appears to be doing fairly well with regard to his wounds. He has been tolerating the dressing changes including the compression wraps without complication. He still has a lot of edema in his upper thigh regions bilaterally he is supposed to be seeing the lymphedema clinic on the 15th of this month once his wraps arrive for the upper part of his legs. 08/18/2019 on evaluation today patient appears to be doing well with regard to his bilateral lower extremities at this point. He has been tolerating the dressing changes without complication. Fortunately there  is no signs of active infection which is also good news. He does have a couple weeping areas on the first and second toe of the right foot he also has just a small area on the left foot upper leg and a small area on the left lower leg but overall he is doing quite well in my opinion. He is supposed to be getting his wraps shortly in fact tomorrow and then subsequently is seeing the lymphedema clinic next Wednesday on the 21st. Of note he is also leaving on the 25th to go on vacation for a week to the beach. For that reason and since there is some uncertainty about what there can be doing at lymphedema clinic next Wednesday I am get a make an appointment for next Friday here for [place] to see what we need to do for him prior to him leaving for vacation. 10/23; patient arrives in considerable pain predominantly in the upper posterior calf just distal to  the popliteal fossa also in the wound anteriorly above the major wound. This is probably cellulitis and he has had this recurrently in the past. He has no open wound on the right side and he has had an Radio broadcast assistant in that area. Finally I note that he has an area on the left posterior calf which by enlarge is mostly epithelialized. This protrudes beyond the borders of the surrounding skin in the setting of dry scaly skin and lymphedema. The patient is leaving for [facility] on Sunday. Per his longstanding pattern, he will not take his compression pumps with him predominantly out of fear that they will be stolen. He therefore asked that we put a Unna boot back on the right leg. He will also contact the wound care center in [facility] to see if they can change his dressing in the mid week. 11/3; patient returned from his vacation to [facility]. He was seen on 1 occasion at their wound care center. They did a 2 layer compression system as they did not have our 4-layer wrap. I am not completely certain what they put on the wounds. They did not change the Unna boot on the right. The patient is also seeing a lymphedema specialist physical therapist in [place]. It appears that he has some compression sleeve for his thighs which indeed look quite a bit better than I am used to seeing. He pumps over these with his external compression pumps. 11/10; the patient has a new wound on the right medial thigh otherwise there is no open areas on the right. He has an area on the left leg posteriorly anteriorly and medially and an area over the left second toe. We have been using silver alginate. He thinks the injury on his thigh is secondary to friction from the compression sleeve he has. 11/17; the patient has a new wound on the right medial thigh last week. He thinks this is because he did not have a underlying stocking for his thigh juxta lite apparatus. He now has this. The area is fairly large and somewhat  angry but I do not think he has underlying cellulitis. ooHe has a intact blister on the right anterior tibial area. ooSmall wound on the right great toe dorsally ooSmall area on the medial left calf. 11/30; the patient does not have any open areas on his right leg and we did not take his juxta lite stocking off. However he states that on Friday his compression wrap fell down lodging around his upper  mid calf area. As usual this creates a lot of problems for him. He called urgently today to be seen for a nurse visit however the nurse visit turned into a provider visit because of extreme erythema and pain in the left anterior tibia extending laterally and posteriorly. The area that is problematic is extensive 10/06/2019 upon evaluation today patient actually appears to be doing poorly in regard to his left lower extremity. He Dr. Leanord Hawking did place him on doxycycline this past Monday apparently due to the fact that he was doing much worse in regard to this left leg. Fortunately the doxycycline does seem to be helping. Unfortunately we are still having a very difficult time getting his edema under any type of control in order to anticipate discharge at some point. The only way were really able to control his lymphedema really is with compression wraps and that has only even seemingly temporary. He has been seeing a lymphedema clinic they are trying to help in this regard but still this has been somewhat frustrating in general for the patient. 10/13/19 on evaluation today patient appears to be doing excellent with regard to his right lower extremity as far as the wounds are concerned. His swelling is still quite extensive unfortunately. He is still having a lot of drainage from the thigh areas bilaterally which is unfortunate. He's been going to lymphedema clinic but again he still really does not have this edema under control as far as his lower extremities are concern. With regard to his left lower  extremity this seems to be improving and I do believe the doxycycline has been of benefit for him. He is about to complete the doxycycline. 10/20/2019 on evaluation today patient appears to be doing poorly in regard to his bilateral lower extremities. More in the right thigh he has a lot of irritation at this site unfortunately. In regard to the left lower extremity the wrap was not quite as high it appears and does seem to have caused him some trouble as well. Fortunately there is no evidence of systemic infection though he does have some blue-green drainage which has me concerned for the possibility of Pseudomonas. He tells me he is previously taking Cipro without complications and he really does not care for Levaquin however due to some of the side effects he has. He is not allergic to any medications specifically antibiotics that were aware of. 10/27/2019 on evaluation today patient actually does appear to be for the most part doing better when compared to last week's evaluation. With that being said he still has multiple open wounds over the bilateral lower extremities. He actually forgot to start taking the Cipro and states that he still has the whole bottle. He does have several new blisters on left lower extremity today I think I would recommend he go ahead and take the Cipro based on what I am seeing at this point. 12/30-Patient comes at 1 week visit, 4 layer compression wraps on the left and Unna boot on the right, primary dressing Xtrasorb and silver alginate. Patient is taking his Cipro and has a few more days left probably 5-6, and the legs are doing better. He states he is using his compressions devices which I believe he has 11/10/2019 on evaluation today patient actually appears to be much better than last time I saw him 2 weeks ago. His wounds are significantly improved and overall I am very pleased in this regard. Fortunately there is no signs of active infection at this  time. He is  just a couple of days away from completing Cipro. Overall his edema is much better he has been using his lymphedema pumps which I think is also helping at this point. 11/17/2019 on evaluation today patient appears to be doing excellent in regard to his wounds in general. His legs are swollen but not nearly as much as they have been in the past. Fortunately he is tolerating the compression wraps without complication. No fevers, chills, nausea, vomiting, or diarrhea. He does have some erythema however in the distal portion of his right lower extremity specifically around the forefoot and toes there is a little bit of warmth here as well. 11/24/2019 on evaluation today patient appears to be doing well with regard to his right lower extremity I really do not see any open wounds at this point. His left lower extremity does have several open areas and his right medial thigh also is open. Other than this however overall the patient seems to be making good progress and I am very pleased at this point. 12/01/2019 on evaluation today patient appears to be doing poorly at this point in regard to his left lower extremity has several new blisters despite the fact that we have him in compression wraps. In fact he had a 4-layer compression wrap, his upper thigh wrapped from lymphedema clinic, and a juxta light over top of the 4 layer compression wrap the lymphedema clinic applied and despite all this he still develop blisters underneath. Obviously this does have me concerned about the fact that unfortunately despite what we are doing to try to get wounds healed he continues to have new areas arise I do not think he is ever good to be at the point where he can realistically just use wraps at home to keep things under control. Typically when we heal him it takes about 1-2 days before he is back in the clinic with severe breakdown and blistering of his lower extremities bilaterally. This is happened numerous times in the  past. Unfortunately I think that we may need some help as far as overall fluid overload to kind of limit what we are seeing and get things under better control. 12/08/2019 on evaluation today patient presents for follow-up concerning his ongoing bilateral lower extremity edema. Unfortunately he is still having quite a bit of swelling the compression wraps are controlling this to some degree but he did see Dr. [name] his cardiologist I do have that available for review today as far as the appointment was concerned that was on 12/06/2019. Obviously that she has been 2 days ago. The patient states that he is only been taking the Lasix 80 mg 1 time a day he had told me previously he was taking this twice a day. Nonetheless Dr. [name] recommended this be up to 80 mg 2 times a day for the patient as he did appear to be fluid overloaded. With that being said the patient states he did this yesterday and he was unable to go anywhere or do anything due to the fact that he was constantly having to urinate. Nonetheless I think that this is still good to be something that is important for him as far as trying to get his edema under control at all things that he is going to be able to just expect his wounds to get under control and things to be better without going through at least a period of time where he is trying to stabilize his fluid management in  general and I think increasing the Lasix is likely the first step here. It was also mentioned the possibility that the patient may require metolazone. With that being said he wanted to have the patient take Lasix twice a day first and then reevaluating 2 months to see where things stand. 12/15/2019 upon evaluation today patient appears to be doing regard to his legs although his toes are showing some signs of weeping especially on the left at this point to some degree on the right. There does not appear to be any signs of active infection and overall I do feel like the  compression wraps are doing well for him but he has not been able to take the Lasix at home and the increased dose that Dr. [name] recommended. He tells me that just not go to be feasible for him. Nonetheless I think in this case he should probably send a message to Dr. [name] in order to discuss options from the standpoint of possible admission to get the fluid off or otherwise going forward. 12/22/2019 upon evaluation today patient appears to be doing fairly well with regard to his lower extremities at this point. In fact he would be doing excellent if it was not for the fact that his right anterior thigh apparently had an allergic reaction to adhesive tape that he used. The wound itself that we have been monitoring actually appears to be healed. There is a lot of irritation at this point. 12/29/2019 upon evaluation today patient appears to be doing well in regard to his lower extremities. His left medial thigh is open and somewhat draining today but this is the only region that is open the right has done much better with the treatment utilizing the steroid cream that I prescribed for him last week. Overall I am pleased in that regard. Fortunately there is no signs of active infection at this time. No fevers, chills, nausea, vomiting, or diarrhea. 01/05/2020 upon evaluation today patient appears to be doing more poorly in regard to his right lower extremity at this point upon evaluation today. Unfortunately he continues to have issues in this regard and I think the biggest issue is controlling his edema. This obviously is not very well controlled at this point is been recommended that he use the Lasix twice a day but he has not been able to do that. Unfortunately I think this is leading to an issue where honestly he is not really able to effectively control his edema and therefore the wounds really are not doing significantly better. I do not think that he is going to be able to keep things under good  control unless he is able to control his edema much better. I discussed this again in great detail with him today. 01/12/2020 good news is patient actually appears to be doing quite well today at this point. He does have an appointment with lymphedema clinic tomorrow. His legs appear healed and the toe on the left is almost completely healed. In general I am very pleased with how things stand at this point. 01/19/2020 upon evaluation today patient appears to actually be doing well in regard to his lower extremities there is nothing open at this point. Fortunately he has done extremely well more recently. Has been seeing lymphedema clinic as well. With that being said he has Velcro wraps for his lower legs as well as his upper legs. The only wound really is on his toe which is the right great toe and this is barely  anything even there. With all that being said I think it is good to be appropriate today to go ahead and switch him over to the Velcro compression wraps. 01/26/2020 upon evaluation today patient appears to be doing worse with regard to his lower extremities after last week switch him to Velcro compression wraps. Unfortunately he lasted less than 24 hours he did not have the sock portion of his Velcro wrap on the left leg and subsequently developed a blister underneath the Velcro portion. Obviously this is not good and not what we were looking for at this point. He states the lymphedema clinic did tell him to wear the wrap for 23 hours and take him off for 1 I am okay with that plan but again right now we got a get things back under control again he may have some cellulitis noted as well. 02/02/2020 upon evaluation today patient unfortunately appears to have several areas of blistering on his bilateral lower extremities today mainly on the feet. His legs do seem to be doing somewhat better which is good news. Fortunately there is no evidence of active infection at this time. No fevers, chills,  nausea, vomiting, or diarrhea. 02/16/2020 upon evaluation today patient appears to be doing well at this time with regard to his legs. He has a couple weeping areas on his toes but for the most part everything is doing better and does appear to be sealed up on his legs which is excellent news. We can continue with wrapping him at this point as he had every time we discontinue the wraps he just breaks out with new wounds. There is really no point in is going forward with this at this point. 03/08/2020 upon evaluation today patient actually appears to be doing quite well with regard to his lower extremity ulcers. He has just a very superficial and really almost nonexistent blister on the left lower extremity he has in general done very well with the compression wraps. With that being said I do not see any signs of infection at this time which is good news. 03/29/2020 upon evaluation today patient appears to be doing well with regard to his wounds currently except for where he had several new areas that opened up due to some of the wrap slipping and causing him trouble. He states he did not realize they had slipped. Nonetheless he has a 1 area on the right and 3 new areas on the left. Fortunately there is no signs of active infection at this time which is great news. 04/05/2020 upon evaluation today patient actually appears to be doing quite well in general in regard to his legs currently. Fortunately there is no signs of active infection at this time. No fevers, chills, nausea, vomiting, or diarrhea. He tells me next week that he will actually be seen in the lymphedema clinic on Thursday at 10 AM I see him on Wednesday next week. 04/12/2020 upon evaluation today patient appears to be doing very well with regard to his lower extremities bilaterally. In fact he does not appear to have any open wounds at this point which is good news. Fortunately there is no signs of active infection at this time. No fevers,  chills, nausea, vomiting, or diarrhea. 04/19/2020 upon evaluation today patient appears to be doing well with regard to his wounds currently on the bilateral lower extremities. There does not appear to be any signs of active infection at this time. Fortunately there is no evidence of systemic infection and overall  very pleased at this point. Nonetheless after I held him out last week he literally had blisters the next morning already which swelled up with him being right back here in the clinic. Overall I think that he is just not can be able to be discharged with his legs the way they are he is much to volume overloaded as far as fluid is concerned and that was discussed with him today of also discussed this but should try the clinic nurse manager as well as Dr. Leanord Hawking. 04/26/2020 upon evaluation today patient appears to be doing better with regard to his wounds currently. He is making some progress and overall swelling is under good control with the compression wraps. Fortunately there is no evidence of active infection at this time. 05/10/2020 on evaluation today patient appears to be doing overall well in regard to his lower extremities bilaterally. He is Tolerating the compression wraps without complication and with what we are seeing currently I feel like that he is making excellent progress. There is no signs of active infection at this time. 05/24/2020 upon evaluation today patient appears to be doing well in regard to his legs. The swelling is actually quite a bit down compared to where it has been in the past. Fortunately there is no sign of active infection at this time which is also good news. With that being said he does have several wounds on his toes that have opened up at this point. 05/31/2020 upon evaluation today patient appears to be doing well with regard to his legs bilaterally where he really has no significant fluid buildup at this point overall he seems to be doing quite well. Very  pleased in this regard. With regard to his toes these also seem to be drying up which is excellent. We have continue to wrap him as every time we tried as a transition to the juxta light wraps things just do not seem to get any better. 06/07/2020 upon evaluation today patient appears to be doing well with regard to his right leg at this point. Unfortunately left leg has a lot of blistering he tells me the wrap started to slide down on him when he tried to put his other Velcro wrap over top of it to help keep things in order but nonetheless still had some issues. 06/14/2020 on evaluation today patient appears to be doing well with regard to his lower extremity ulcers and foot ulcers at this point. I feel like everything is actually showing signs of improvement which is great news overall there is no signs of active infection at this time. No fevers, chills, nausea, vomiting, or diarrhea. 06/21/2020 on evaluation today patient actually appears to be doing okay in regard to his wounds in general. With that being said the biggest issue I see is on his right foot in particular the first and second toe seem to be doing a little worse due to the fact this is staying very wet. I think he is probably getting need to change out his dressings a couple times in between each week when we see him in regard to his toes in order to keep this drier based on the location and how this is proceeding. 06/28/2020 on evaluation today patient appears to be doing a little bit more poorly overall in regard to the appearance of the skin I am actually somewhat concerned about the possibility of him having a little bit of an infection here. We discussed the course of potentially giving him  a doxycycline prescription which he is taken previously with good result. With that being said I do believe that this is potentially mild and at this point easily fixed. I just do not want anything to get any worse. 07/12/2020 upon evaluation today  patient actually appears to be making some progress with regard to his legs which is great news there does not appear to be any evidence of active infection. Overall very pleased with where things stand. 07/26/2020 upon evaluation today patient appears to be doing well with regard to his leg ulcers and toe ulcers at this point. He has been tolerating the compression wraps without complication overall very pleased in this regard. 08/09/2020 upon evaluation today patient appears to be doing well with regard to his lower extremities bilaterally. Fortunately there is no signs of active infection overall I am pleased with where things stand. 08/23/2020 on evaluation today patient appears to be doing well with regard to his wound. He has been tolerating the dressing changes without complication. Fortunately there is no signs of active infection at this time. Overall his legs seem to be doing quite well which is great news and I am very pleased in that regard. No fevers, chills, nausea, vomiting, or diarrhea. 09/13/2020 upon evaluation today patient appears to be doing okay in regard to his lower extremities. He does have a fairly large blister on the right leg which I did remove the blister tissue from today so we can get this to dry out other than that however he seems to be doing quite well. There is no signs of active infection at this time. 09/27/2020 upon evaluation today patient appears to actually be doing some better in regard to his right leg. Fortunately signs of active infection at this time which is great news. No fevers, chills, nausea, vomiting, or diarrhea. 10/04/2020 upon evaluation today patient actually appears to be showing signs of improvement which is great news with regard to his leg ulcers. Fortunately there is no signs of active infection which is great news he is still taking the antibiotics currently. No fevers, chills, nausea, vomiting, or diarrhea. 10/18/2020 on evaluation today  patient appears to be doing well with regard to his legs currently. He has been tolerating the dressing changes including the wraps without complication. Fortunately there is no signs of active infection at this time. No fevers, chills, nausea, vomiting, or diarrhea. Objective Constitutional Obese and well-hydrated in no acute distress. Vitals Time Taken: 11:37 AM, Height: 70 in, Weight: 380.2 lbs, BMI: 54.5, Temperature: 98.3 F, Pulse: 60 bpm, Respiratory Rate: 19 breaths/min, Blood Pressure: 139/56 mmHg, Capillary Blood Glucose: 171 mg/dl. Respiratory normal breathing without difficulty. Psychiatric this patient is able to make decisions and demonstrates good insight into disease process. Alert and Oriented x 3. pleasant and cooperative. General Notes: Upon inspection patient's wound bed actually showed signs of good granulation at this time and excellent epithelization pretty much across the board. There was no signs of active infection at this time which is great news. Integumentary (Hair, Skin) Wound #185 status is Open. Original cause of wound was Blister. The wound is located on the Right,Lateral Lower Leg. The wound measures 9cm length x 7cm width x 0.1cm depth; 49.48cm^2 area and 4.948cm^3 volume. There is no tunneling or undermining noted. There is a medium amount of serosanguineous drainage noted. The wound margin is distinct with the outline attached to the wound base. There is large (67-100%) red, pink granulation within the wound bed. There is a small (  1-33%) amount of necrotic tissue within the wound bed including Adherent Slough. 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 There was a Radio broadcast assistant Compression Therapy Procedure by Shawn Stall, RN. Post procedure Diagnosis Wound #185: Same as Pre-Procedure There was a Four Layer Compression Therapy Procedure by Shawn Stall, RN. Post procedure Diagnosis Wound #: Same as Pre-Procedure Plan Follow-up Appointments: Return appointment in 3 weeks. Nurse Visit: - 12/22 and 12/29 Bathing/ Shower/ Hygiene: May shower with protection but do not get wound dressing(s) wet. Edema Control - Lymphedema / SCD / Other: Lymphedema Pumps. Use Lymphedema pumps on leg(s) 2-3 times a day for 45-60 minutes. If wearing any wraps or hose, do not remove them. Continue exercising as instructed. Elevate legs to the level of the heart or above for 30 minutes daily and/or when sitting, a frequency of: Avoid standing for long periods of time. Exercise regularly Non Wound Condition: Other Non Wound Condition Orders/Instructions: - lotion to leg, 4 layer compression wrap WOUND #185: - Lower Leg Wound Laterality: Right, Lateral Peri-Wound Care: Sween Lotion (Moisturizing lotion) 1 x Per Week/ Discharge Instructions: Apply moisturizing lotion as directed Prim Dressing: KerraCel Ag Gelling Fiber Dressing, 4x5 in (silver alginate) 1 x Per Week/ ary Discharge Instructions: Apply silver alginate to wound bed as instructed Secondary Dressing: ABD Pad, 8x10 1 x Per Week/ Discharge Instructions: Apply over primary dressing as directed. Com pression Wrap: Unnaboot w/Calamine, 4x10 (in/yd) 1 x Per Week/ Discharge Instructions: Apply Unnaboot as directed. 1. I would recommend currently that we going continue with the wound care measures as before and the patient is in agreement with the plan. This includes the use of the silver  alginate to any open areas. Were also using compression wrapping. 2. I would recommend specifically that we utilize the 4-layer compression wrap on the left lower extremity and the Unna boot wrap on the right lower extremity. 3. I would recommend he continue to use his pumps as well as elevate his legs much as possible try to keep edema under good control. We will see patient back for reevaluation in 1 week here in the clinic. If anything worsens or changes patient will contact our office for additional recommendations. Electronic Signature(s) Signed: 10/18/2020 12:25:03 PM By: Lenda Kelp PA-C Entered By: Lenda Kelp on 10/18/2020 12:25:03 -------------------------------------------------------------------------------- SuperBill Details Patient Name: Date of Service: [name], Taiwo J. 10/18/2020 Medical Record Number: [id number] Patient Account Number: [id number] Date of Birth/Sex: Treating RN: 1951/07/23 (69 y.o. 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 I89.0 Lymphedema, not elsewhere classified E11.622 Type 2 diabetes mellitus with other skin ulcer 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, unspecified L03.116 Cellulitis of left lower limb Facility Procedures CPT4  Code: 69629528 Description: (Facility Use Only) [phone] - APPLY MULTLAY COMPRS LWR LT LEG Modifier: 59 Quantity: 1 CPT4 Code: [id number] Description: (Facility Use Only) [id number] - APPLY UNNA BOOT RT Modifier: Quantity: 1 Physician Procedures : CPT4 Code  Description Modifier [id number] - WC PHYS LEVEL 3 - EST PT ICD-10 Diagnosis Description 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 skin L97.221 Non-pressure chronic ulcer of left calf limited to breakdown of  skin 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 of bilateral lower extremity I89.0 Lymphedema, not elsewhere classified Quantity: 1 Electronic Signature(s) Signed: 10/18/2020 12:25:38 PM By: Lenda Kelp PA-C Previous Signature: 10/18/2020 12:25:20 PM Version By: Lenda Kelp PA-C Entered By: Lenda Kelp on 10/18/2020 12:25:36

## 2020-10-18 NOTE — Progress Notes (Signed)
DEARL, [name] ([id number]) Visit Report for 10/18/2020 Arrival Information Details Patient Name: Date of Service: [name], [name] 10/18/2020 10:30 A M Medical Record Number: [id number] Patient Account Number: [id number] Date of Birth/Sex: Treating RN: 09-Feb-1951 (69 y.o. [name], [name] Primary Care Velva Molinari: [name] Other Clinician: Referring Ting Cage: Treating Worth Kober/Extender: [name] in Treatment: 247 Visit Information History Since Last Visit Added or deleted any medications: No Patient Arrived: [name] Any new allergies or adverse reactions: No Arrival Time: 11:36 Had a fall or experienced change in No Accompanied By: self activities of daily living that may affect Transfer Assistance: None risk of falls: Patient Identification Verified: Yes Signs or symptoms of abuse/neglect since last visito No Secondary Verification Process Completed: Yes Hospitalized since last visit: No Patient Requires Transmission-Based Precautions: No Implantable device outside of the clinic excluding No Patient Has Alerts: Yes cellular tissue based products placed in the center since last visit: Has Dressing in Place as Prescribed: Yes Has Compression in Place as Prescribed: Yes Pain Present Now: No Electronic Signature(s) Signed: 10/18/2020 5:29:15 PM By: Baruch Gouty RN, BSN Entered By: Baruch Gouty on 10/18/2020 12:39:09 -------------------------------------------------------------------------------- Compression Therapy Details Patient Name: Date of Service: [name], [name] 10/18/2020 10:30 A M Medical Record Number: [id number] Patient Account Number: [id number] Date of Birth/Sex: Treating RN: Nov 15, 1950 (69 y.o. [name] Primary Care Piccola Arico: [name] Other Clinician: Referring Derisha Funderburke: Treating Therma Lasure/Extender: Ruffin Pyo in Treatment: 247 Compression Therapy Performed for Wound  Assessment: Wound #185 Right,Lateral Lower Leg Performed By: Clinician Deon Pilling, RN Compression Type: [name] Post Procedure Diagnosis Same as Pre-procedure Electronic Signature(s) Signed: 10/18/2020 5:29:15 PM By: Baruch Gouty RN, BSN Entered By: Baruch Gouty on 10/18/2020 12:18:00 -------------------------------------------------------------------------------- Compression Therapy Details Patient Name: Date of Service: [name], [name] 10/18/2020 10:30 A M Medical Record Number: [id number] Patient Account Number: [id number] Date of Birth/Sex: Treating RN: Mar 30, 1951 (69 y.o. [name] Primary Care Alesandra Smart: [name] Other Clinician: Referring Dameshia Seybold: Treating Dianne Whelchel/Extender: Ruffin Pyo in Treatment: 247 Compression Therapy Performed for Wound Assessment: NonWound Condition Lymphedema - Left Leg Performed By: Clinician Deon Pilling, RN Compression Type: Four Layer Post Procedure Diagnosis Same as Pre-procedure Electronic Signature(s) Signed: 10/18/2020 5:29:15 PM By: Baruch Gouty RN, BSN Entered By: Baruch Gouty on 10/18/2020 12:18:31 -------------------------------------------------------------------------------- Encounter Discharge Information Details Patient Name: Date of Service: [facility], [name] 10/18/2020 10:30 A M Medical Record Number: [id number] Patient Account Number: [id number] Date of Birth/Sex: Treating RN: [birth year] (69 y.o. Oval Linsey Primary Care Nori Winegar: [name] Other Clinician: Referring Latonya Knight: Treating Jolly Carlini/Extender: Ruffin Pyo in Treatment: [phone] Encounter Discharge Information Items Discharge Condition: Stable Ambulatory Status: Walker Discharge Destination: Home Transportation: Private Auto Accompanied By: self Schedule Follow-up Appointment: Yes Clinical Summary of Care: Patient Declined Electronic Signature(s) Signed: 10/18/2020  5:05:31 PM By: [name] RN Entered By: [name] on 10/18/2020 12:35:00 -------------------------------------------------------------------------------- Lower Extremity Assessment Details Patient Name: Date of Service: [name], Kevin 10/18/2020 10:30 A M Medical Record Number: [id number] Patient Account Number: [id number] Date of Birth/Sex: Treating RN: [birth year] (69 y.o. [name], [name] Primary Care [name]: [name] Other Clinician: Referring Shaman Muscarella: Treating [name]/Extender: [name] in Treatment: 247 Edema Assessment Assessed: [name]: Yes] [name]: Yes] Edema: [Left: Yes] [Right: Yes] Calf Left: Right: Point of Measurement: 25 cm From Medial Instep 41 cm 43 cm Ankle Left: Right: Point of Measurement: 9 cm From Medial Instep 28  cm 28 cm Vascular Assessment Pulses: Dorsalis Pedis Palpable: [Left:Yes] [Right:Yes] Posterior Tibial Palpable: [Left:Yes] [Right:Yes] Electronic Signature(s) Signed: 10/18/2020 5:01:40 PM By: Rhae Hammock RN Signed: 10/18/2020 5:29:15 PM By: Baruch Gouty RN, BSN Entered By: Baruch Gouty on 10/18/2020 12:39:34 -------------------------------------------------------------------------------- Multi Wound Chart Details Patient Name: Date of Service: [facility], [name] 10/18/2020 10:30 A M Medical Record Number: [id number] Patient Account Number: [id number] Date of Birth/Sex: Treating RN: Mar 12, 1951 (69 y.o. [name] Primary Care Sheenah [name]: [name] Other Clinician: Referring Edlin Ford: Treating Erine Phenix/Extender: [name] in Treatment: 247 Vital Signs Height(in): 70 Capillary Blood Glucose(mg/dl): 171 Weight(lbs): 380.2 Pulse(bpm): 60 Body Mass Index(BMI): 55 Blood Pressure(mmHg): 139/56 Temperature(F): 98.3 Respiratory Rate(breaths/min): 19 Photos: [185:No Photos Right, Lateral Lower Leg] [N/A:N/A N/A] Wound Location:  [185:Blister] [N/A:N/A] Wounding Event: [185:Venous Leg Ulcer] [N/A:N/A] Primary Etiology: [185:Lymphedema] [N/A:N/A] Secondary Etiology: [185:Chronic sinus problems/congestion,] [N/A:N/A] Comorbid History: [185:Arrhythmia, Hypertension, Peripheral Arterial Disease, Type II Diabetes, History of Burn, Gout, Confinement Anxiety 09/13/2020] [N/A:N/A] Date Acquired: [185:4] [N/A:N/A] Weeks of Treatment: [185:Open] [N/A:N/A] Wound Status: [185:Yes] [N/A:N/A] Clustered Wound: [185:9x7x0.1] [N/A:N/A] Measurements L x W x D (cm) [185:49.48] [N/A:N/A] A (cm) : rea [262:0.355] [N/A:N/A] Volume (cm) : [185:47.10%] [N/A:N/A] % Reduction in Area: [185:47.10%] [N/A:N/A] % Reduction in Volume: [185:Full Thickness Without Exposed] [N/A:N/A] Classification: [185:Support Structures Medium] [N/A:N/A] Exudate Amount: [185:Serosanguineous] [N/A:N/A] Exudate Type: [185:red, brown] [N/A:N/A] Exudate Color: [185:Distinct, outline attached] [N/A:N/A] Wound Margin: [185:Large (67-100%)] [N/A:N/A] Granulation Amount: [185:Red, Pink] [N/A:N/A] Granulation Quality: [185:Small (1-33%)] [N/A:N/A] Necrotic Amount: [185:Fascia: No] [N/A:N/A] Exposed Structures: [185:Fat Layer (Subcutaneous Tissue): No Tendon: No Muscle: No Joint: No Bone: No Small (1-33%)] [N/A:N/A] Epithelialization: [185:Compression Therapy] [N/A:N/A] Treatment Notes Electronic Signature(s) Signed: 10/18/2020 12:22:41 PM By: Worthy Keeler PA-C Signed: 10/18/2020 5:29:15 PM By: Baruch Gouty RN, BSN Entered By: Worthy Keeler on 10/18/2020 12:22:41 -------------------------------------------------------------------------------- [place] Details Patient Name: Date of Service: [facility], Rainer J. 10/18/2020 10:30 A M Medical Record Number: [id number] Patient Account Number: [id number] Date of Birth/Sex: Treating RN: 08/08/1951 (69 y.o. [name] Primary Care Madolyn Ackroyd: [name] Other Clinician: Referring  Raymie Trani: Treating Rika Daughdrill/Extender: [name] in Treatment: [phone] Active Inactive Venous Leg Ulcer Nursing Diagnoses: Actual venous Insuffiency (use after diagnosis is confirmed) Goals: Patient will maintain optimal edema control Date Initiated: 09/10/2016 Target Resolution Date: 10/25/2020 Goal Status: Active Verify adequate tissue perfusion prior to therapeutic compression application Date Initiated: 09/10/2016 Date Inactivated: 11/28/2016 Goal Status: Met Interventions: Assess peripheral edema status every visit. Compression as ordered Provide education on venous insufficiency Notes: Electronic Signature(s) Signed: 10/18/2020 5:29:15 PM By: Baruch Gouty RN, BSN Entered By: Baruch Gouty on 10/18/2020 12:41:05 -------------------------------------------------------------------------------- Pain Assessment Details Patient Name: Date of Service: Kevin [name], Brenyn J. 10/18/2020 10:30 A M Medical Record Number: [id number] Patient Account Number: [id number] Date of Birth/Sex: Treating RN: [birth year] (69 y.o. Erie Noe Primary Care Sufyan Meidinger: [name] Other Clinician: Referring Brentley Landfair: Treating Teonia Yager/Extender: [name] in Treatment: [phone] Active Problems Location of Pain Severity and Description of Pain Patient Has Paino No Site Locations Rate the pain. Current Pain Level: 0 Pain Management and Medication Current Pain Management: Electronic Signature(s) Signed: 10/18/2020 5:01:40 PM By: Rhae Hammock RN Signed: 10/18/2020 5:29:15 PM By: Baruch Gouty RN, BSN Entered By: Baruch Gouty on 10/18/2020 12:39:25 -------------------------------------------------------------------------------- Patient/Caregiver Education Details Patient Name: Date of Service: [facility], [name] 12/15/2021andnbsp10:30 A M Medical Record Number: [id number] Patient Account Number: [id number] Date of  Birth/Gender: Treating RN: 11/11/1950 (  69 y.o. [name] Primary Care Physician: [name] Other Clinician: Referring Physician: Treating Physician/Extender: [name] in Treatment: [phone] Education Assessment Education Provided To: Patient Education Topics Provided Venous: Methods: Explain/Verbal Responses: Reinforcements needed, State content correctly Wound/Skin Impairment: Methods: Explain/Verbal Responses: Reinforcements needed, State content correctly Electronic Signature(s) Signed: 10/18/2020 5:29:15 PM By: Baruch Gouty RN, BSN Entered By: Baruch Gouty on 10/18/2020 12:41:25 -------------------------------------------------------------------------------- Wound Assessment Details Patient Name: Date of Service: Kevin [name], Ricke J. 10/18/2020 10:30 A M Medical Record Number: [id number] Patient Account Number: [id number] Date of Birth/Sex: Treating RN: [birth year] (69 y.o. [name], [name] Primary Care Chelcee Korpi: [name] Other Clinician: Referring Gwendolyne Welford: Treating Glyn Zendejas/Extender: [name] in Treatment: 247 Wound Status Wound Number: 185 Primary Venous Leg Ulcer Etiology: Wound Location: Right, Lateral Lower Leg Secondary Lymphedema Wounding Event: Blister Etiology: Date Acquired: 09/13/2020 Wound Open Weeks Of Treatment: 4 Status: Clustered Wound: Yes Comorbid Chronic sinus problems/congestion, Arrhythmia, Hypertension, History: Peripheral Arterial Disease, Type II Diabetes, History of Burn, Gout, Confinement Anxiety Wound Measurements Length: (cm) 9 Width: (cm) 7 Depth: (cm) 0.1 Area: (cm) 49.48 Volume: (cm) 4.948 % Reduction in Area: 47.1% % Reduction in Volume: 47.1% Epithelialization: Small (1-33%) Tunneling: No Undermining: No Wound Description Classification: Full Thickness Without Exposed Support Structures Wound Margin: Distinct, outline attached Exudate  Amount: Medium Exudate Type: Serosanguineous Exudate Color: red, brown Foul Odor After Cleansing: No Slough/Fibrino Yes Wound Bed Granulation Amount: Large (67-100%) Exposed Structure Granulation Quality: Red, Pink Fascia Exposed: No Necrotic Amount: Small (1-33%) Fat Layer (Subcutaneous Tissue) Exposed: No Necrotic Quality: Adherent Slough Tendon Exposed: No Muscle Exposed: No Joint Exposed: No Bone Exposed: No Treatment Notes Wound #185 (Lower Leg) Wound Laterality: Right, Lateral Cleanser Peri-Wound Care Sween Lotion (Moisturizing lotion) Discharge Instruction: Apply moisturizing lotion as directed Topical Primary Dressing KerraCel Ag Gelling Fiber Dressing, 4x5 in (silver alginate) Discharge Instruction: Apply silver alginate to wound bed as instructed Secondary Dressing ABD Pad, 8x10 Discharge Instruction: Apply over primary dressing as directed. Secured With Compression Wrap Unnaboot w/Calamine, 4x10 (in/yd) Discharge Instruction: Apply Unnaboot as directed. Compression Stockings Add-Ons Electronic Signature(s) Signed: 10/18/2020 5:01:40 PM By: Rhae Hammock RN Signed: 10/18/2020 5:29:15 PM By: Baruch Gouty RN, BSN Previous Signature: 10/18/2020 11:50:34 AM Version By: Rhae Hammock RN Entered By: Baruch Gouty on 10/18/2020 12:40:03 -------------------------------------------------------------------------------- Vitals Details Patient Name: Date of Service: Kevin [name], Harrell J. 10/18/2020 10:30 A M Medical Record Number: [id number] Patient Account Number: [id number] Date of Birth/Sex: Treating RN: 1951/08/04 (69 y.o. [name], [name] Primary Care Keevon Henney: [name] Other Clinician: Referring Davied Nocito: Treating Jadaya Sommerfield/Extender: [name] in Treatment: 247 Vital Signs Time Taken: 11:37 Temperature (F): 98.3 Height (in): 70 Pulse (bpm): 60 Weight (lbs): 380.2 Respiratory Rate (breaths/min): 19 Body  Mass Index (BMI): 54.5 Blood Pressure (mmHg): 139/56 Capillary Blood Glucose (mg/dl): 171 Reference Range: 80 - 120 mg / dl Electronic Signature(s) Signed: 10/18/2020 5:29:15 PM By: Baruch Gouty RN, BSN Entered By: Baruch Gouty on 10/18/2020 12:39:17

## 2020-10-19 ENCOUNTER — Other Ambulatory Visit: Payer: Self-pay

## 2020-10-19 ENCOUNTER — Other Ambulatory Visit: Payer: Medicare Other

## 2020-10-19 DIAGNOSIS — R8290 Unspecified abnormal findings in urine: Secondary | ICD-10-CM

## 2020-10-19 MED ORDER — LISINOPRIL 40 MG PO TABS
ORAL_TABLET | ORAL | 1 refills | Status: DC
Start: 2020-10-19 — End: 2021-04-18

## 2020-10-19 NOTE — Addendum Note (Signed)
Addended by: Ilda Foil on: 10/19/2020 02:35 PM   Modules accepted: Orders

## 2020-10-20 LAB — URINE CULTURE
MICRO NUMBER:: 11326693
Result:: NO GROWTH
SPECIMEN QUALITY:: ADEQUATE

## 2020-10-25 ENCOUNTER — Encounter (HOSPITAL_BASED_OUTPATIENT_CLINIC_OR_DEPARTMENT_OTHER): Payer: Medicare Other | Admitting: Physician Assistant

## 2020-10-25 ENCOUNTER — Other Ambulatory Visit: Payer: Self-pay

## 2020-10-25 DIAGNOSIS — L03116 Cellulitis of left lower limb: Secondary | ICD-10-CM | POA: Diagnosis not present

## 2020-10-25 DIAGNOSIS — L97812 Non-pressure chronic ulcer of other part of right lower leg with fat layer exposed: Secondary | ICD-10-CM | POA: Diagnosis not present

## 2020-10-25 DIAGNOSIS — I87333 Chronic venous hypertension (idiopathic) with ulcer and inflammation of bilateral lower extremity: Secondary | ICD-10-CM | POA: Diagnosis not present

## 2020-10-25 DIAGNOSIS — I890 Lymphedema, not elsewhere classified: Secondary | ICD-10-CM | POA: Diagnosis not present

## 2020-10-25 DIAGNOSIS — I87331 Chronic venous hypertension (idiopathic) with ulcer and inflammation of right lower extremity: Secondary | ICD-10-CM | POA: Diagnosis not present

## 2020-10-25 DIAGNOSIS — L97221 Non-pressure chronic ulcer of left calf limited to breakdown of skin: Secondary | ICD-10-CM | POA: Diagnosis not present

## 2020-10-25 DIAGNOSIS — E11622 Type 2 diabetes mellitus with other skin ulcer: Secondary | ICD-10-CM | POA: Diagnosis not present

## 2020-10-25 DIAGNOSIS — E1140 Type 2 diabetes mellitus with diabetic neuropathy, unspecified: Secondary | ICD-10-CM | POA: Diagnosis not present

## 2020-10-25 DIAGNOSIS — L97211 Non-pressure chronic ulcer of right calf limited to breakdown of skin: Secondary | ICD-10-CM | POA: Diagnosis not present
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
## 2020-10-26 NOTE — Progress Notes (Signed)
[name] ([id number]) Visit Report for 10/25/2020 Chief Complaint Document Details Patient Name: Date of Service: [name], [name] 10/25/2020 10:30 A M Medical Record Number: [id number] Patient Account Number: [id number] Date of Birth/Sex: Treating RN: [birth year] (69 y.o. [name] Primary Care Provider: Nicoletta [name] Other Clinician: Referring Provider: Treating Provider/Extender: [name] in Treatment: [phone] Information Obtained from: Patient Chief Complaint patient is here for evaluation venous/lymphedema weeping Electronic Signature(s) Signed: 10/25/2020 10:19:40 AM By: Kevin Kelp PA-C Entered By: [name] on 10/25/2020 10:19:40 -------------------------------------------------------------------------------- HPI Details Patient Name: Date of Service: [name], [name] 10/25/2020 10:30 A M Medical Record Number: [id number] Patient Account Number: [id number] Date of Birth/Sex: Treating RN: 02-21-1951 (69 y.o. [name] Primary Care Provider: Nicoletta [name] Other Clinician: Referring Provider: Treating Provider/Extender: [name] in Treatment: 248 History of Present Illness HPI Description: Referred by PCP for consultation. Patient has long standing history of BLE venous stasis, no prior ulcerations. At beginning of month, developed cellulitis and weeping. Received IM Rocephin followed by Keflex and resolved. Wears compression stocking, appr 6 months old. Not sure strength. No present drainage. 01/22/16 this is a patient who is a type II diabetic on insulin. 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 He tells me he religiously wears pressure stockings of uncertain strength. He was here with weeping edema about 8 months ago but did not have an open wound. Roughly a month ago he had a reopening on his bilateral legs. He is been using bandages and Neosporin. He  does not complain of pain. 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 Last BUN/creatinine I have is from 11/20/15 at 13 and 1.0 respectively 01/29/16; patient arrives today having tolerated the Profore wrap. He brought in his stockings and these are 18 mmHg stockings he bought from [employer]. The compression here is likely inadequate. He does not complain of pain or excessive drainage she has no systemic symptoms. The wound on the right looks improved as does the one on the left although one on the left is more substantial with still tissue at risk below the actual wound area on the bilateral posterior calf 02/05/16; patient arrives with poor edema control. He states that we did put a 4 layer compression on it last week. No weight appear 5 this. 02/12/16; the area on the posterior right Has healed. The left Has a substantial wound that has necrotic surface eschar that requires a debridement with a curette. 02/16/16;the patient called or a Nurse visit secondary to increased swelling. He had been in earlier in the week with his right leg healed. He was transitioned to is on pressure stocking on the right leg with the only open wound on the left, a substantial area on the left posterior calf. Note he has a history of severe lower extremity edema, he has a history of chronic atrial fibrillation but not heart failure per my notes but I'll need to research this. He is not complaining of chest pain shortness of breath or orthopnea. The intake nurse noted blisters on the previously closed right leg 02/19/16; this is the patient's regular visit day. I see him on Friday with escalating edema new wounds on the right leg and clear signs of at least right ventricular heart failure. I increased his Lasix to 40 twice a day.  He is returning currently in follow-up. States he is noticed a decrease in that the  edema 02/26/16 patient's legs have much less edema. There is nothing really open on the right leg. The left leg has improved condition of the large superficial wound on the posterior left leg 03/04/16; edema control is very much better. The patient's right leg wounds have healed. On the left leg he continues to have severe venous inflammation on the posterior aspect of the left leg. There is no tenderness and I don't think any of this is cellulitis. 03/11/16; patient's right leg is married healed and he is in his own stocking. The patient's left leg has deteriorated somewhat. There is a lot of erythema around the wound on the posterior left leg. There is also a significant rim of erythema posteriorly just above where the wrap would've ended there is a new wound in this location and a lot of tenderness. Can't rule out cellulitis in this area. 03/15/16; patient's right leg remains healed and he is in his own stocking. The patient's left leg is much better than last review. His major wound on the posterior aspect of his left Is almost fully epithelialized. He has 3 small injuries from the wraps. Really. Erythema seems a lot better on antibiotics 03/18/16; right leg remains healed and he is in his own stocking. The patient's left leg is much better. The area on the posterior aspect of the left calf is fully epithelialized. His 3 small injuries which were wrap injuries on the left are improved only one seems still open his erythema has resolved 03/25/16; patient's right leg remains healed and he is in his own stocking. There is no open area today on the left leg posterior leg is completely closed up. His wrap injuries at the superior aspect of his leg are also resolved. He looks as though he has some irritation on the dorsal ankle but this is fully epithelialized without evidence of infection. 03/28/16; we discharged this patient on Monday. Transitioned him into his own stocking. There were problems almost  immediately with uncontrolled swelling weeping edema multiple some of which have opened. He does not feel systemically unwell in particular no chest pain no shortness of breath and he does not feel 04/08/16; the edema is under better control with the Profore light wrap but he still has pitting edema. There is one large wound anteriorly 2 on the medial aspect of his left leg and 3 small areas on the superior posterior calf. Drainage is not excessive he is tolerating a Profore light well 04/15/16; put a Profore wrap on him last week. This is controlled is edema however he had a lot of pain on his left anterior foot most of his wounds are healed 04/22/16 once again the patient has denuded areas on the left anterior foot which he states are because his wrap slips up word. He saw his primary physician today is on Lasix 40 twice a day and states that he his weight is down 20 pounds over the last 3 months. 04/29/16: Much improved. left anterior foot much improved. He is now on Lasix 80 mg per day. Much improved edema control 05/06/16; I was hoping to be able to discharge him today however once again he has blisters at a low level of where the compression was placed last week mostly on his left lateral but also his left medial leg and a small area on the anterior part of the left foot. 05/09/16; apparently the patient went  home after his appointment on 7/4 later in the evening developing pain in his upper medial thigh together with subjective fever and chills although his temperature was not taken. The pain was so intense he felt he would probably have to call 911. However he then remembered that he had leftover doxycycline from a previous round of antibiotics and took these. By the next morning he felt a lot better. He called and spoke to one of our nurses and I approved doxycycline over the phone thinking that this was in relation to the wounds we had previously seen although they were definitely were not. The  patient feels a lot better old fever no chills he is still working. Blood sugars are reasonably controlled 05/13/16; patient is back in for review of his cellulitis on his anterior medial upper thigh. He is taking doxycycline this is a lot better. Culture I did of the nodular area on the dorsal aspect of his foot grew MRSA this also looks a lot better. 05/20/16; the patient is cellulitis on the medial upper thigh has resolved. All of his wound areas including the left anterior foot, areas on the medial aspect of the left calf and the lateral aspect of the calf at all resolved. He has a new blister on the left dorsal foot at the level of the fourth toe this was excised. No evidence of infection 05/27/16; patient continues to complain weeping edema. He has new blisterlike wounds on the left anterior lateral and posterior lateral calf at the top of his wrap levels. The area on his left anterior foot appears better. He is not complaining of fever, pain or pruritus in his feet. 05/30/16; the patient's blisters on his left anterior leg posterior calf all look improved. He did not increase the Lasix 100 mg as I suggested because he was going to run out of his 40 mg tablets. He is still having weeping edema of his toes 06/03/16; I renewed his Lasix at 80 mg once a day as he was about to run out when I last saw him. He is on 80 mg of Lasix now. I have asked him to cut down on the excessive amount of water he was drinking and asked him to drink according to his thirst mechanisms 06/12/2016 -- was seen 2 days ago and was supposed to wear his compression stockings at home but he is developed lymphedema and superficial blisters on the left lower extremity and hence came in for a review 06/24/16; the remaining wound is on his left anterior leg. He still has edema coming from between his toes. There is lymphedema here however his edema is generally better than when I last saw this. He has a history of atrial fibrillation  but does not have a known history of congestive heart failure nevertheless I think he probably has this at least on a diastolic basis. 07/01/16 I reviewed his echocardiogram from January 2017. This was essentially normal. He did not have LVH, EF of 55-60%. His right ventricular function was normal although he did have trivial tricuspid and pulmonic regurgitation. This is not audible on exam however. I increased his Lasix to do massive edema in his legs well above his knees I think in early July. He was also drinking an excessive amount of water at the time. 07/15/16; missed his appointment last week because of the Labor Day holiday on Monday. He could not get another appointment later in the week. Started to feel the wrap digging in superiorly so  we remove the top half and the bottom half of his wrap. He has extensive erythema and blistering superiorly in the left leg. Very tender. Very swollen. Edema in his foot with leaking edema fluid. He has not been systemically unwell 07/22/16; the area on the left leg laterally required some debridement. The medial wounds look more stable. His wrap injury wounds appear to have healed. Edema and his foot is better, weeping edema is also better. He tells me he is meeting with the supplier of the external compression pumps at work 08/05/16; the patient was on vacation last week in [facility]. His wrap is been on for an extended period of time. Also over the weekend he developed an extensive area of tender erythema across his anterior medial thigh. He took to doxycycline yesterday that he had leftover from a previous prescription. The patient complains of weeping edema coming out of his toes 08/08/16; I saw this patient on 10/2. He was tender across his anterior thigh. I put him on doxycycline. He returns today in follow-up. He does not have any open wounds on his lower leg, he still has edema weeping into his toes. 08/12/16; patient was seen back urgently today to  follow-up for his extensive left thigh cellulitis/erysipelas. He comes back with a lot less swelling and erythema pain is much better. I believe I gave him Augmentin and Cipro. His wrap was cut down as he stated a roll down his legs. He developed blistering above the level of the wrap that remained. He has 2 open blisters and 1 intact. 08/19/16; patient is been doing his primary doctor who is increased his Lasix from 40-80 once a day or 80 already has less edema. Cellulitis has remained improved in the left thigh. 2 open areas on the posterior left calf 08/26/16; he returns today having new open blisters on the anterior part of his left leg. He has his compression pumps but is not yet been shown how to use some vital representative from the supplier. 09/02/16 patient returns today with no open wounds on the left leg. Some maceration in his plantar toes 09/10/2016 -- Dr. Leanord Hawking had recently discharged him on 09/02/2016 and he has come right back with redness swelling and some open ulcers on his left lower extremity. He says this was caused by trying to apply his compression stockings and he's been unable to use this and has not been able to use his lymphedema pumps. He had some doxycycline leftover and he has started on this a few days ago. 09/16/16; there are no open wounds on his leg on the left and no evidence of cellulitis. 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 He does not complain of symptoms here. I am not clear use using his external compression pumps. 09/23/16; I have not seen this patient in 2 weeks. He canceled his appointment 10 days ago as he was going on vacation. He tells me that on Monday he noticed a large area on his posterior left leg which is been draining copiously and is reopened into a large wound. He is been using ABDs and the external part of his juxtalite, according to our nurse this was not on properly. 10/07/16;  Still a substantial area on the posterior left leg. Using silver alginate 10/14/16; in general better although there is still open area which looks healthy. Still using silver alginate. He reminds me that this happen before he left for [facility].  T oday while he was showering in the morning. He had been using his juxtalite's 10/21/16; the area on his posterior left leg is fully epithelialized. However he arrives today with a large area of tender erythema in his medial and posterior left thigh just above the knee. I have marked the area. Once again he is reluctant to consider hospitalization. I treated him with oral antibiotics in the past for a similar situation with resolution I think with doxycycline however this area it seems more extensive to me. He is not complaining of fever but does have chills and says states he is thirsty. His blood sugar today was in the 140s at home 10/25/16 the area on his posterior left leg is fully epithelialized although there is still some weeping edema. The large area of tenderness and erythema in his medial and posterior left thigh is a lot less tender although there is still a lot of swelling in this thigh. He states he feels a lot better. He is on doxycycline and Augmentin that I started last week. This will continued until Tuesday, December 26. I have ordered a duplex ultrasound of the left thigh rule out DVT whether there is an abscess something that would need to be drained I would also like to know. 11/01/16; he still has weeping edema from a not fully epithelialized area on his left posterior calf. Most of the rest of this looks a lot better. He has completed his antibiotics. His thigh is a lot better. Duplex ultrasound did not show a DVT in the thigh 11/08/16; he comes in today with more Denuded surface epithelium from the posterior aspect of his calf. There is no real evidence of cellulitis. The superior aspect of his wrap appears to have put quite an  indentation in his leg just below the knee and this may have contributed. He does not complain of pain or fever. We have been using silver alginate as the primary dressing. The area of cellulitis in the right thigh has totally resolved. He has been using his compression stockings once a week 11/15/16; the patient arrives today with more loss of epithelium from the posterior aspect of his left calf. He now has a fairly substantial wound in this area. The reason behind this deterioration isn't exactly clear although his edema is not well controlled. He states he feels he is generally more swollen systemically. He is not complaining of chest pain shortness of breath fever. T me he has an appointment with his primary physician in early February. He is on 80 mg of oral ells Lasix a day. He claims compliance with the external compression pumps. He is not having any pain in his legs similar to what he has with his recurrent cellulitis 11/22/16; the patient arrives a follow-up of his large area on his left lateral calf. This looks somewhat better today. He came in earlier in the week for a dressing change since I saw him a week ago. He is not complaining of any pain no shortness of breath no chest pain 11/28/16; the patient arrives for follow-up of his large area on the left lateral calf this does not look better. In fact it is larger weeping edema. The surface of the wound does not look too bad. We have been using silver alginate although I'm not certain that this is a dressing issue. 12/05/16; again the patient follows up for a large wound on the left lateral and left posterior calf this does not look better. There continues  to be weeping edema necrotic surface tissue. More worrisome than this once again there is erythema below the wound involving the distal Achilles and heel suggestive of cellulitis. He is on his feet working most of the day of this is not going well. We are changing his dressing twice a week to  facilitate the drainage. 12/12/16; not much change in the overall dimensions of the large area on the left posterior calf. This is very inflamed looking. I gave him an. Doxycycline last week does not really seem to have helped. He found the wrap very painful indeed it seems to of dog into his legs superiorly and perhaps around the heel. He came in early today because the drainage had soaked through his dressings. 12/19/16- patient arrives for follow-up evaluation of his left lower extremity ulcers. He states that he is using his lymphedema pumps once daily when there is "no drainage". He admits to not using his lipedema pumps while under current treatment. His blood sugars have been consistently between 150-200. 12/26/16; the patient is not using his compression pumps at home because of the wetness on his feet. I've advised him that I think it's important for him to use this daily. He finds his feet too wet, he can put a plastic bag over his legs while he is in the pumps. Otherwise I think will be in a vicious circle. We are using silver alginate to the major area on his left posterior calf 01/02/17; the patient's posterior left leg has further of all into 3 open wounds. All of them covered with a necrotic surface. He claims to be using his compression pumps once a day. His edema control is marginal. Continue with silver alginate 01/10/17; the patient's left posterior leg actually looks somewhat better. There is less edema, less erythema. Still has 3 open areas covered with a necrotic surface requiring debridement. He claims to be using his compression pumps once a day his edema control is better 01/17/17; the patient's left posterior calf look better last week when I saw him and his wrap was changed 2 days ago. He has noted increasing pain in the left heel and arrives today with much larger wounds extensive erythema extending down into the entire heel area especially tender medially. He is not  systemically unwell CBGs have been controlled no fever. Our intake nurse showed me limegreen drainage on his AVD pads. 01/24/17; his usual this patient responds nicely to antibiotics last week giving him Levaquin for presumed Pseudomonas. The whole entire posterior part of his leg is much better much less inflamed and in the case of his Achilles heel area much less tender. He has also had some epithelialization posteriorly there are still open areas here and still draining but overall considerably better 01/31/17- He has continue to tolerate the compression wraps. he states that he continues to use the lymphedema pumps daily, and can increase to twice daily on the weekends. He is voicing no complaints or concerns regarding his LLE ulcers 02/07/17-he is here for follow-up evaluation. He states that he noted some erythema to the left medial and anterior thigh, which he states is new as of yesterday. He is concerned about recurrent cellulitis. He states his blood sugars have been slightly elevated, this morning in the 180s 02/14/17; he is here for follow-up evaluation. When he was last here there was erythema superiorly from his posterior wound in his anterior thigh. He was prescribed Levaquin however a culture of the wound surface grew MRSA over the  phone I changed him to doxycycline on Monday and things seem to be a lot better. 02/24/17; patient missed his appointment on Friday therefore we changed his nurse visit into a physician visit today. Still using silver alginate on the large area of the posterior left thigh. He isn't new area on the dorsal left second toe 03/03/17; actually better today although he admits he has not used his external compression pumps in the last 2 days or so because of work responsibilities over the weekend. 03/10/17; continued improvement. External compression pumps once a day almost all of his wounds have closed on the posterior left calf. Better edema control 03/17/17; in general  improved. He still has 3 small open areas on the lateral aspect of his left leg however most of the area on the posterior part of his leg is epithelialized. He has better edema control. He has an ABD pad under his stocking on the right anterior lower leg although he did not let us look at that today. 03/24/17; patient arrives back in clinic today with no open areas however there are areas on the posterior left calf and anterior left calf that are less than 100% epithelialized. His edema is well controlled in the left lower leg. There is some pitting edema probably lymphedema in the left upper thigh. He uses compression pumps at home once per day. I tried to get him to do this twice a day although he is very reticent. 04/01/2017 -- for the last 2 days he's had significant redness, tenderness and weeping and came in for an urgent visit today. 04/07/17; patient still has 6 more days of doxycycline. He was seen by Dr. Meyer Russel last Wednesday for cellulitis involving the posterior aspect, lateral aspect of his Involving his heel. For the most part he is better there is less erythema and less weeping. He has been on his feet for 12 hours 2 over the weekend. Using his compression pumps once a day 04/14/17 arrives today with continued improvement. Only one area on the posterior left calf that is not fully epithelialized. He has intense bilateral venous inflammation associated with his chronic venous insufficiency disease and secondary lymphedema. We have been using silver alginate to the left posterior calf wound In passing he tells [place] today that the right leg but we have not seen in quite some time has an open area on it but he doesn't want [place] to look at this today states he will show this to [place] next week. 04/21/17; there is no open area on his left leg although he still reports some weeping edema. He showed [place] his right leg today which is the first time we've seen this leg in a long time. He has a large area of  open wound on the right leg anteriorly healthy granulation. Quite a bit of swelling in the right leg and some degree of venous inflammation. He told us about the right leg in passing last week but states that deterioration in the right leg really only happened over the weekend 04/28/17; there is no open area on the left leg although there is an irritated part on the posterior which is like a wrap injury. The wound on the right leg which was new from last week at least to [place] is a lot better. 05/05/17; still no open area on the left leg. Patient is using his new compression stocking which seems to be doing a good job of controlling the edema. He states he is using his compression  pumps once per day. The right leg still has an open wound although it is better in terms of surface area. Required debridement. A lot of pain in the posterior right Achilles marked tenderness. Usually this type of presentation this patient gives concern for an active cellulitis 05/12/17; patient arrives today with his major wound from last week on the right lateral leg somewhat better. Still requiring debridement. He was using his compression stocking on the left leg however that is reopened with superficial wounds anteriorly he did not have an open wound on this leg previously. He is still using his juxta light's once daily at night. He cannot find the time to do this in the morning as he has to be at work by 7 AM 05/19/17; right lateral leg wound looks improved. No debridement required. The concerning area is on the left posterior leg which appears to almost have a subcutaneous hemorrhagic component to it. We've been using silver alginate to all the wounds 05/26/17; the right lateral leg wound continues to look improved. However the area on the left posterior calf is a tightly adherent surface. [place] using silver alginate. Because of the weeping edema in his legs there is very little good alternatives. 06/02/17; the patient left  here last week looking quite good. Major wound on the left posterior calf and a small one on the right lateral calf. Both of these look satisfactory. He tells me that by Wednesday he had noted increased pain in the left leg and drainage. He called on Thursday and Friday to get an appointment here but we were blocked. He did not go to urgent care or his primary physician. He thinks he had a fever on Thursday but did not actually take his temperature. He has not been using his compression pumps on the left leg because of pain. I advised him to go to the emergency room today for IV antibiotics for stents of left leg cellulitis but he has refused I have asked him to take 2 days off work to keep his leg elevated and he has refused this as well. In view of this I'm going to call him and Augmentin and doxycycline. 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 However after a detailed discussion regarding the risk benefits and alternatives he refuses to get admitted to the hospital. With no other recourse I will continue him on oral antibiotics as before and hopefully he'll have his infectious disease consultation this week. 06/16/2017 -- the patient was seen today by the nurse practitioner at infectious disease Ms. Dixon. 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 He has also been advise Lotrimin cream locally. she also had wise IV antibiotics for his condition if it flares up 06/23/17; patient arrives today with drainage bilaterally although the remaining wound on the left posterior calf after  cleaning up today "highlighter yellow drainage" did not look too bad. Unfortunately he has had breakdown on the right anterior leg [previously this leg had not been open and he is using a black stocking] he went to see infectious disease  and is been put on clindamycin 150 mg daily, I did not verify the dose although I'm not familiar with using clindamycin in this dosing range, perhaps for prophylaxisoo 06/27/17; I brought this patient back today to follow-up on the wound deterioration on the right lower leg together with surrounding cellulitis. I started him on doxycycline 4 days ago. This area looks better however he comes in today with intense cellulitis on the medial part of his left thigh. This is not have a wound in this area. Extremely tender. We've been using silver alginate to the wounds on the right lower leg left lower leg with bilateral 4 layer compression he is using his external compression pumps once a day 07/04/17; patient's left medial thigh cellulitis looks better. He has not been using his compression pumps as his insert said it was contraindicated with cellulitis. His right leg continues to make improvements all the wounds are still open. We only have one remaining wound on the left posterior calf. Using silver alginate to all open areas. He is on doxycycline which I started a week ago and should be finishing I gave him Augmentin after Thursday's visit for the severe cellulitis on the left medial thigh which fortunately looks better 07/14/17; the patient's left medial thigh cellulitis has resolved. The cellulitis in his right lower calf on the right also looks better. All of his wounds are stable to improved we've been using silver alginate he has completed the antibiotics I have given him. He has clindamycin 150 mg once a day prescribed by infectious disease for prophylaxis, I've advised him to start this now. We have been using bilateral Unna boots over silver alginate to the wound  areas 07/21/17; the patient is been to see infectious disease who noted his recurrent problems with cellulitis. He was not able to tolerate prophylactic clindamycin therefore he is on amoxicillin 500 twice a day. He also had a second daily dose of Lasix added By Dr. Oneta Rack but he is not taking this. Nor is he being completely compliant with his compression pumps a especially not this week. He has 2 remaining wounds one on the right posterior lateral lower leg and one on the left posterior medial lower leg. 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 The patient has Unna boots bilaterally. 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 He has bilateral Unna boots for compression and silver alginate to his wounds. Arrives today with his legs looking as good as I have seen him in quite some time. Not surprisingly his wounds look better as well with improvement on the right lateral leg venous insufficiency wound and also the left medial leg. He is still using the compression pumps once a day 08/11/17; both legs appear to be doing better wounds on the right lateral and left medial legs look better. Skin on the right leg quite good. He is been using silver alginate as the primary dressing. I'm going to use Anasept gel calcium alginate and maintain all the secondary dressings 08/18/17; the patient continues to actually do quite well. The area on his right lateral leg is just about closed the left medial also looks better although it is still moist in this area. His edema is well controlled we have been using Anasept gel with calcium alginate and  the usual secondary dressings, 4 layer compression and once daily use of his compression pumps "always been able to manage 09/01/17; the patient  continues to do reasonably well in spite of his trip to [place]. The area on the right lateral leg is epithelialized. Left is much better but still open. He has more edema and more chronic erythema on the left leg [venous inflammation] 09/08/17; he arrives today with no open wound on the right lateral leg and decently controlled edema. Unfortunately his left leg is not nearly as in his good situation as last week.he apparently had increasing edema starting on Saturday. He edema soaked through into his foot so used a plastic bag to walk around his home. The area on the medial right leg which was his open area is about the same however he has lost surface epithelium on the left lateral which is new and he has significant pain in the Achilles area of the left foot. He is already on amoxicillin chronically for prophylaxis of cellulitis in the left leg 09/15/17; he is completed a week of doxycycline and the cellulitis in the left posterior leg and Achilles area is as usual improved. He still has a lot of edema and fluid soaking through his dressings. There is no open wound on the right leg. He saw infectious disease NP today 09/22/17;As usual 1 we transition him from our compression wraps to his stockings things did not go well. He has several small open areas on the right leg. He states this was caused by the compression wrap on his skin although he did not wear this with the stockings over them. He has several superficial areas on the left leg medially laterally posteriorly. He does not have any evidence of active cellulitis especially involving the left Achilles The patient is traveling from [facility] Saturday going to [name]. He states he isn't attempting to get an appointment with a heel objects wound [name] there to change his dressings. I am not completely certain whether this will work 10/06/17; the patient came in on Friday for a nurse visit and the nurse reported that his legs actually look  quite good. He arrives in clinic today for his regular follow-up visit. He has a new wound on his left third toe over the PIP probably caused by friction with his footwear. He has small areas on the left leg and a very superficial but epithelialized area on the right anterior lateral lower leg. Other than that his legs look as good as I've seen him in quite some time. We have been using silver alginate Review of systems; no chest pain no shortness of breath other than this a 10 point review of systems negative 10/20/17; seen by Dr. Meyer Russel last week. He had taken some antibiotics [doxycycline] that he had left over. Dr. Meyer Russel thought he had candida infection and declined to give him further antibiotics. He has a small wound remaining on the right lateral leg several areas on the left leg including a larger area on the left posterior several left medial and anterior and a small wound on the left lateral. The area on the left dorsal third toe looks a lot better. ROS; Gen.; no fever, respiratory no cough no sputum Cardiac no chest pain other than this 10 point review of system is negative 10/30/17; patient arrives today having fallen in the bathtub 3 days ago. It took him a while to get up. He has pain and maceration in the wounds on his left  leg which have deteriorated. He has not been using his pumps he also has some maceration on the right lateral leg. 11/03/17; patient continues to have weeping edema especially in the left leg. This saturates his dressings which were just put on on 12/27. As usual the doxycycline seems to take care of the cellulitis on his lower leg. He is not complaining of fever, chills, or other systemic symptoms. He states his leg feels a lot better on the doxycycline I gave him empirically. He also apparently gets injections at his primary doctor's officeo Rocephin for cellulitis prophylaxis. I didn't ask him about his compression pump compliance today I think that's probably  marginal. Arrives in the clinic with all of his dressings primary and secondary macerated full of fluid and he has bilateral edema 11/10/17; the patient's right leg looks some better although there is still a cluster of wounds on the right lateral. The left leg is inflamed with almost circumferential skin loss medially to laterally although we are still maintaining anteriorly. He does not have overt cellulitis there is a lot of drainage. He is not using compression pumps. We have been using silver alginate to the wound areas, there are not a lot of options here 11/17/17; the patient's right leg continues to be stable although there is still open wounds, better than last week. The inflammation in the left leg is better. Still loss of surface layer epithelium especially posteriorly. There is no overt cellulitis in the amount of edema and his left leg is really quite good, tells me he is using his compression pumps once a day. 11/24/17; patient's right leg has a small superficial wound laterally this continues to improve. The inflammation in the left leg is still improving however we have continuous surface layer epithelial loss posteriorly. There is no overt cellulitis in the amount of edema in both legs is really quite good. He states he is using his compression pumps on the left leg once a day for 5 out of 7 days 12/01/17; very small superficial areas on the right lateral leg continue to improve. Edema control in both legs is better today. He has continued loss of surface epithelialization and left posterior calf although I think this is better. We have been using silver alginate with large number of absorptive secondary dressings 4 layer on the left Unna boot on the right at his request. He tells me he is using his compression pumps once a day 12/08/17; he has no open area on the right leg is edema control is good here. On the left leg however he has marked erythema and tenderness breakdown of skin. He has  what appears to be a wrap injury just distal to the popliteal fossa. 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 There was swelling erythema and breakdown in the right leg. He has much worse skin breakdown in the left leg as well multiple open areas medially and posteriorly as well as laterally. He tells me he has been using his compression pumps but tells me he feels that the drainage out of his leg is worse when he uses a compression pumps. T be fair to him he is been saying this o for a while however I don't  know that I have really been listening to this. I wonder if the compression pumps are working properly 12/22/17;. Once again he arrives with severe erythema, weeping edema from the left greater than right leg. Noncompliance with compression pumps. New this visit he is complaining of pain on the lateral aspect of the right leg and the medial aspect of his right thigh. He apparently saw his cardiologist Dr. [name] who was ordered an echocardiogram area and I think this is a step in the right direction 12/25/17; started his doxycycline Monday night. There is still intense erythema of the right leg especially in the anterior thigh although there is less tenderness. The erythema around the wound on the right lateral calf also is less tender. He still complaining of pain in the left heel. His wounds are about the same right lateral left medial left lateral. Superficial but certainly not close to closure. He denies being systemically unwell no fever chills no abdominal pain no diarrhea 12/29/17; back in follow-up of his extensive right calf and right thigh cellulitis. I added amoxicillin to cover possible doxycycline resistant  strep. This seems to of done the trick he is in much less pain there is much less erythema and swelling. He has his echocardiogram at 11:00 this morning. X-ray of the left heel was also negative. 01/05/18; the patient arrived with his edema under much better control. Now that he is retired he is able to use his compression pumps daily and sometimes twice a day per the patient. He has a wound on the right leg the lateral wound looks better. Area on the left leg also looks a lot better. He has no evidence of cellulitis in his bilateral thighs I had a quick peak at his echocardiogram. He is in normal ejection fraction and normal left ventricular function. He has moderate pulmonary hypertension moderately reduced right ventricular function. One would have to wonder about chronic sleep apnea although he says he doesn't snore. He'll review the echocardiogram with his cardiologist. 01/12/18; the patient arrives with the edema in both legs under exemplary control. He is using his compression pumps daily and sometimes twice daily. His wound on the right lateral leg is just about closed. He still has some weeping areas on the posterior left calf and lateral left calf although everything is just about closed here as well. I have spoken with Aldean Baker who is the patient's nurse practitioner and infectious disease. She was concerned that the patient had not understood that the parenteral penicillin injections he was receiving for cellulitis prophylaxis was actually benefiting him. I don't think the patient actually saw that I would tend to agree we were certainly dealing with less infections although he had a serious one last month. 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 He is healed. He'll be placed in juxtalite compression wraps and increase his lymphedema pumps to twice daily. We will follow up again next week to ensure there are no issues with the new  regiment. 01/20/18-he is here for evaluation of bilateral lower extremity weeping edema. Yesterday he was placed in compression wrap to the right lower extremity and compression stocking to left lower shrubbery. He states he uses lymphedema pumps last night and again this morning and noted a blister to the left lower extremity. On exam he was noted to have drainage to the right lower extremity. He will be placed in Unna boots bilaterally and follow-up next week 01/26/18; patient was actually discharged a week  ago to his own juxta light stockings only to return the next day with bilateral lower extremity weeping edema.he was placed in bilateral Unna boots. He arrives today with pain in the back of his left leg. There is no open area on the right leg however there is a linear/wrap injury on the left leg and weeping edema on the left leg posteriorly. I spoke with infectious disease about 10 days ago. They were disappointed that the patient elected to discontinue prophylactic intramuscular penicillin shots as they felt it was particularly beneficial in reducing the frequency of his cellulitis. I discussed this with the patient today. He does not share this view. He'll definitely need antibiotics today. Finally [place] and returning a week later and he does not travel with his pumps. He is going by car 01/30/18; patient was seen 4 days ago and brought back in today for review of cellulitis in the left leg posteriorly. I put him on amoxicillin this really hasn't helped as much as I might like. He is also worried because [place]. Finally we will be rewrapping him. There is no open area on the right leg over his left leg has multiple weeping areas as usual 02/09/18; The same wrap on for 10 days. He did not pick up the last doxycycline I prescribed for him. He apparently took 4 days worth he already had. There is nothing open on  his right leg and the edema control is really quite good. He's had damage in the left leg medially and laterally especially probably related to the prolonged use of Unna boots 02/12/18; the patient arrived in clinic today for a nurse visit/wrap change. He complained of a lot of pain in the left posterior calf. He is taking doxycycline that I previously prescribed for him. Unfortunately even though he used his stockings and apparently used to compression pumps twice a day he has weeping edema coming out of the lateral part of his right leg. This is coming from the lower anterior lateral skin area. 02/16/18; the patient has finished his doxycycline and will finish the amoxicillin 2 days. The area of cellulitis in the left calf posteriorly has resolved. He is no longer having any pain. He tells me he is using his compression pumps at least once a day sometimes twice. 02/23/18; the patient finished his doxycycline and Amoxil last week. On Friday he noticed a small erythematous circle about the size of a quarter on the left lower leg just above his ankle. This rapidly expanded and he now has erythema on the lateral and posterior part of the thigh. This is bright red. Also has an area on the dorsal foot just above his toes and a tender area just below the left popliteal fossa. He came off his prophylactic penicillin injections at his own insistence one or 2 months ago. This is obviously deteriorated since then 03/02/18; patient is on doxycycline and Amoxil. Culture I did last week of the weeping area on the back of his left calf grew group B strep. I have therefore renewed the amoxicillin 500 3 times a day for a further week. He has not been systemically unwell. Still complaining of an area of discomfort right under his left popliteal fossa. There is no open wound on the right leg. He tells me that he is using his pumps twice a day on most days 03/09/18; patient arrives in clinic today completing his  amoxicillin  today. The cellulitis on his left leg is better. Furthermore he tells me that he had intramuscular penicillin shots that his primary care office today. However he also states that the wrap on his right leg fell down shortly after leaving clinic last week. He developed a large blister that was present when he came in for a nurse visit later in the week and then he developed intense discomfort around this area.He tells me he is using his compression pumps 03/16/18; the patient has completed his doxycycline. The infectious part of this/cellulitis in the left heel area left popliteal area is a lot better. He has 2 open areas on the right calf. Still areas on the left calf but this is a lot better as well. 03/24/18; the patient arrives complaining of pain in the left popliteal area again. He thinks some of this is wrap injury. He has no open area on the right leg and really no open area on the left calf either except for the popliteal area. He claims to be compliant with the compression pumps 03/31/18; I gave him doxycycline last week because of cellulitis in the left popliteal area. This is a lot better although the surface epithelium is denuded off and response to this. He arrives today with uncontrolled edema in the right calf area as well as a fingernail injury in the right lateral calf. There is only a few open areas on the left 04/06/18; I gave him amoxicillin doxycycline over the last 2 weeks that the amoxicillin should be completing currently. He is not complaining of any pain or systemic symptoms. The only open areas see has is on the right lateral lower leg paradoxically I cannot see anything on the left lower leg. He tells me he is using his compression pumps twice a day on most days. Silver alginate to the wounds that are open under 4 layer compression 04/13/18; he completed antibiotics and has no new complaints. Using his compression pumps. Silver alginate that anything that's opened 04/20/18; he is  using his compression pumps religiously. Silver alginate 4 layer compression anything that's opened. He comes in today with no open wounds on the left leg but 3 on the right including a new one posteriorly. He has 2 on the right lateral and one on the right posterior. He likes Unna boots on the right leg for reasons that aren't really clear we had the usual 4 layer compression on the left. It may be necessary to move to the 4 layer compression on the right however for now I left them in the Unna boots 04/27/18; he is using his compression pumps at least once a day. He has still the wounds on the right lateral calf. The area right posteriorly has closed. He does not have an open wound on the left under 4 layer compression however on the dorsal left foot just proximal to the toes and the left third toe 2 small open areas were identified 05/11/18; he has not uses compression pumps. The areas on the right lateral calf have coalesced into one large wound necrotic surface. On the left side he has one small wound anteriorly however the edema is now weeping out of a large part of his left leg. He says he wasn't using his pumps because of the weeping fluid. I explained to him that this is the time he needs to pump more 05/18/18; patient states he is using his compression pumps twice a day. The area on the right lateral large  wound albeit superficial. On the left side he has innumerable number of small new wounds on the left calf particularly laterally but several anteriorly and medially. All these appear to have healthy granulated base these look like the remnants of blisters however they occurred under compression. The patient arrives in clinic today with his legs somewhat better. There is certainly less edema, less multiple open areas on the left calf and the right anterior leg looks somewhat better as well superficial and a little smaller. However he relates pain and erythema over the last 3-4 days in the thigh  and I looked at this today. He has not been systemically unwell no fever no chills no change in blood sugar values 05/25/18; comes in today in a better state. The severe cellulitis on his left leg seems better with the Keflex. Not as tender. He has not been systemically unwell Hard to find an open wound on the left lower leg using his compression pumps twice a day The confluent wounds on his right lateral calf somewhat better looking. These will ultimately need debridement I didn't do this today. 06/01/18; the severe cellulitis on the left anterior thigh has resolved and he is completed his Keflex. There is no open wound on the left leg however there is a superficial excoriation at the base of the third toe dorsally. Skin on the bottom of his left foot is macerated looking. The left the wounds on the lateral right leg actually looks some better although he did require debridement of the top half of this wound area with an open curet 06/09/18 on evaluation today patient appears to be doing poorly in regard to his right lower extremity in particular this appears to likely be infected he has very thick purulent discharge along with a bright green tent to the discharge. This makes me concerned about the possibility of pseudomonas. He's also having increased discomfort at this point on evaluation. Fortunately there does not appear to be any evidence of infection spreading to the other location at this time. 06/16/18 on evaluation today patient appears to actually be doing fairly well. His ulcer has actually diminished in size quite significantly at this point which is good news. Nonetheless he still does have some evidence of infection he did see infectious disease this morning before coming here for his appointment. I did review the results of their evaluation and their note today. They did actually have him discontinue the Cipro and initiate treatment with linezolid at this time. He is doing this for the  next seven days and they recommended a follow-up in four months with them. He is the keep a log of the need for intermittent antibiotic therapy between now and when he falls back up with infectious disease. This will help them gaze what exactly they need to do to try and help them out. 06/23/18; the patient arrives today with no open wounds on the left leg and left third toe healed. He is been using his compression pumps twice a day. On the right lateral leg he still has a sizable wound but this is a lot better than last time I saw this. 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 Has been using silver alginate under 4 layer compression 06/30/18; the only open wound he has is on the right lateral leg and this looks healthy. No debridement is required. We have been using silver alginate.  He does not have an open wound on the left leg. There is apparently some drainage from the dorsal proximal third toe on the left although I see no open wound here. 07/03/18 on evaluation today patient was actually here just for a nurse visit rapid change. However when he was here on Wednesday for his rat change due to having been healed on the left and then developing blisters we initiated the wrap again knowing that he would be back today for [place] to reevaluate and see were at. Unfortunately he has developed some cellulitis into the proximal portion of his right lower extremity even into the region of his thigh. 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 He was placed on one as what at that point. Nonetheless he is done with that and has been tolerating it well otherwise. Doxycycline which in the past really did not seem to be effective for him. Nonetheless I think the best option may be for [place] to definitely reinitiate the antibiotics for a longer period of time. 07/07/18; since I  last saw this patient a week ago he has had a difficult time. At that point he did not have an open wound on his left leg. We transitioned him into juxta light stockings. He was apparently in the clinic the next day with blisters on the left lateral and left medial lower calf. He also had weeping edema fluid. He was put back into a compression wrap. He was also in the clinic on Friday with intense erythema in his right thigh. Per the patient he was started on Bactrim however that didn't work at all in terms of relieving his pain and swelling. He has taken 3 doxycycline that he had left over from last time and that seems to of helped. He has blistering on the right thigh as well. 07/14/18; the erythema on his right thigh has gotten better with doxycycline that he is finishing. 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 Presumably this cellulitis in the thigh was not related to that although I think this is a bit concerning going forward. He still has an area on the right lateral calf the blister on the right medial calf just below the knee that was discussed above. On the left 2 small open areas left medial and left lateral. Edema control is adequate. He is using his compression pumps twice a day 07/20/18; continued improvement in the condition of both legs especially the edema in his bilateral thighs. He tells me he is been losing weight through a combination of diet and exercise. He is using his compression pumps twice a day. So overall she made to the remaining wounds 07/27/2018; continued improvement in condition of both legs. His edema is well controlled. The area on the right lateral leg is just about closed he had one blisters show up on the medial left upper calf. We have him in 4 layer compression. He is going on a 10-day trip to [place]. He will be driving. He wants to wear Unna boots because of  the lessening amount of constriction. He will not use compression pumps while he is away 08/05/18 on evaluation today patient actually appears to be doing decently well all things considered in regard to his bilateral lower extremities. The worst ulcer is actually only posterior aspect of his left lower extremity  with a four layer compression wrap cut into his leg a couple weeks back. He did have a trip and actually had [employer] for the trip that he is worn since he was last here. Nonetheless he feels like the [employer] actually do better for him his swelling is up a little bit but he also with his trip was not taking his Lasix on a regular set schedule like he was supposed to be. He states that obviously the reason being that he cannot drive and keep going without having to urinate too frequently which makes it difficult. He did not have his pumps with him while he was away either which I think also maybe playing a role here too. 08/13/2018; the patient only has a small open wound on the right lateral calf which is a big improvement in the last month or 2. He also has the area posteriorly just below the posterior fossa on the left which I think was a wrap injury from several weeks ago. He has no current evidence of cellulitis. He tells me he is back into his compression pumps twice a day. He also tells me that while he was at the laundromat somebody stole a section of his extremitease stockings 08/20/2018; back in the clinic with a much improved state. He only has small areas on the right lateral mid calf which is just about healed. This was is more substantial area for quite a prolonged period of time. He has a small open area on the left anterior tibia. The area on the posterior calf just below the popliteal fossa is closed today. He is using his compression pumps twice a day 08/28/2018; patient has no open wound on the right leg. He has a smattering of open areas on the calf with some  weeping lymphedema. More problematically than that it looks as though his wraps of slipped down in his usual he has very angry upper area of edema just below the right medial knee and on the right lateral calf. He has no open area on his feet. The patient is traveling to [name] next week. I will send him in an antibiotic. We will continue to wrap the right leg. We ordered extremitease stockings for him last week and I plan to transition the right leg to a stocking when he gets home which will be in 10 days time. As usual he is very reluctant to take his pumps with him when he travels 09/07/2018; patient returns from [name]. He shows me a picture of his left leg in the mid part of his trip last week with intense fire engine erythema. The picture look bad enough I would have considered sending him to the hospital. Instead he went to the [name] in [facility]. They did not prescribe him antibiotics but he did take some doxycycline he had leftover from a previous visit. I had given him trimethoprim sulfamethoxazole before he left this did not work according to the patient. This is resulted in some improvement fortunately. He comes back with a large wound on the left posterior calf. Smaller area on the left anterior tibia. Denuded blisters on the dorsal left foot over his toes. Does not have much in the way of wounds on the right leg although he does have a very tender area on the right posterior area just below the popliteal fossa also suggestive of infection. He promises me he is back on his pumps twice a day  09/15/2018; the intense cellulitis in his left lower calf is a lot better. The wound area on the posterior left calf is also so better. However he has reasonably extensive wounds on the dorsal aspect of his second and third toes and the proximal foot just at the base of the toes. There is nothing open on the right leg 09/22/2018; the patient has excellent edema  control in his legs bilaterally. He is using his external compression pumps twice a day. He has no open area on the right leg and only the areas in the left foot dorsally second and third toe area on the left side. He does not have any signs of active cellulitis. 10/06/2018; the patient has good edema control bilaterally. He has no open wound on the right leg. There is a blister in the posterior aspect of his left calf that we had to deal with today. He is using his compression pumps twice a day. There is no signs of active cellulitis. We have been using silver alginate to the wound areas. He still has vulnerable areas on the base of his left first second toes dorsally He has a his extremities stockings and we are going to transition him today into the stocking on the right leg. He is cautioned that he will need to continue to use the compression pumps twice a day. If he notices uncontrolled edema in the right leg he may need to go to 3 times a day. 10/13/2018; the patient came in for a nurse check on Friday he has a large flaccid blister on the right medial calf just below the knee. We unroofed this. He has this and a new area underneath the posterior mid calf which was undoubtedly a blister as well. He also has several small areas on the right which is the area we put his extremities stocking on. 10/19/2018; the patient went to see infectious disease this morning I am not sure if that was a routine follow-up in any case the doxycycline I had given him was discontinued and started on linezolid. He has not started this. It is easy to look at his left calf and the inflammation and think this is cellulitis however he is very tender in the tissue just below the popliteal fossa and I have no doubt that there is infection going on here. He states the problem he is having is that with the compression pumps the edema goes down and then starts walking the wrap falls down. We will see if we can adhere this. He  has 1 or 2 minuscule open areas on the right still areas that are weeping on the posterior left calf, the base of his left second and third toes 10/26/18; back today in clinic with quite of skin breakdown in his left anterior leg. This may have been infection the area below the popliteal fossa seems a lot better however tremendous epithelial loss on the left anterior mid tibia area over quite inexpensive tissue. He has 2 blisters on the right side but no other open wound here. 10/29/2018; came in urgently to see [place] today and we worked him in for review. He states that the 4 layer compression on the right leg caused pain he had to cut it down to roughly his mid calf this caused swelling above the wrap and he has blisters and skin breakdown today. As a result of the pain he has not been using his pumps. Both legs are a lot more edematous and there is  a lot of weeping fluid. 11/02/18; arrives in clinic with continued difficulties in the right leg> left. Leg is swollen and painful. multiple skin blisters and new open areas especially laterally. He has not been using his pumps on the right leg. 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 He is not systemically unwell. 11/09/2018; the patient claims he is being compliant with his pumps. He is finished the doxycycline I gave him last week. Culture I did of the wound on the right lateral leg showed a few very resistant methicillin staph aureus. This was resistant to doxycycline. Nevertheless he states the pain in the leg is a lot better which makes me wonder if the cultured organism was not really what was causing the problem nevertheless this is a very dangerous organism to be culturing out of any wound. His right leg is still a lot larger than the left. He is using an Radio broadcast assistant on this area, he blames a 4-layer compression for causing the original skin breakdown which I doubt is true however I cannot talk him out  of it. We have been using silver alginate to all of these areas which were initially blisters 11/16/2018; patient is being compliant with his external compression pumps at twice a day. Miraculously he arrives in clinic today with absolutely no open wounds. He has better edema control on the left where he has been using 4 layer compression versus wound of wounds on the right and I pointed this out to him. There is no inflammation in the skin in his lower legs which is also somewhat unusual for him. There is no open wounds on the dorsal left foot. He has extremitease stockings at home and I have asked him to bring these in next week. 11/25/18 patient's lower extremity on examination today on the left appears for the most part to be wound free. He does have an open wound on the lateral aspect of the right lower extremity but this is minimal compared to what I've seen in past. He does request that we go ahead and wrap the left leg as well even though there's nothing open just so hopefully it will not reopen in short order. 1/28; patient has superficial open wounds on the right lateral calf left anterior calf and left posterior calf. His edema control is adequate. 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 We have been using silver alginate as the primary dressing. He claims compliance with his compression pumps 2/4; patient has superficial open wounds on numerous areas of his left calf and again one on the left dorsal foot. The areas on the right lateral calf have healed. The cellulitis that I gave him doxycycline for last week is also resolved this was mostly on the left anterior calf just below the tibial tuberosity. His edema looks fairly well-controlled. He tells me he went to see his primary doctor today and had blood work ordered 2/11; once again he has several open areas on the left calf left tibial area. Most of  these are small and appear to have healthy granulation. He does not have anything open on the right. The edema and control in his thighs is pretty good which is usually a good indication he has been using his pumps as requested. 2/18; he continues to have several small areas on the left calf and left tibial area. Most of these  are small healthy granulation. We put him in his stocking on the right leg last week and he arrives with a superficial open area over the right upper tibia and a fairly large area on the right lateral tibia in similar condition. His edema control actually does not look too bad, he claims to be using his compression pumps twice a day 2/25. Continued small areas on the left calf and left tibial area. New areas especially on the right are identified just below the tibial tuberosity and on the right upper tibia itself. There are also areas of weeping edema fluid even without an obvious wound. He does not have a considerable degree of lymphedema but clearly there is more edema here than his skin can handle. He states he is using the pumps twice a day. We have an Unna boot on the right and 4 layer compression on the left. 3/3; he continues to have an area on the right lateral calf and right posterior calf just below the popliteal fossa. There is a fair amount of tenderness around the wound on the popliteal fossa but I did not see any evidence of cellulitis, could just be that the wrap came down and rubbed in this area. He does not have an open area on the left leg however there is an area on the left dorsal foot at the base of the third toe We have been using silver alginate to all wound areas 3/10; he did not have an open area on his left leg last time he was here a week ago. T oday he arrives with a horizontal wound just below the tibial tuberosity and an area on the left lateral calf. He has intense erythema and tenderness in this area. The area is on the right lateral calf and  right posterior calf better than last week. We have been using silver alginate as usual 3/18 - Patient returns with 3 small open areas on left calf, and 1 small open area on right calf, the skin looks ok with no significant erythema, he continues the UNA boot on right and 4 layer compression on left. The right lateral calf wound is closed , the right posterior is small area. we will continue silver alginate to the areas. 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 He is been tolerating the dressing changes without complication. Fortunately she has made excellent improvement in regard to the overall status of his wounds. Unfortunately every time we cease wrapping him he ends up reopening in causing more significant issues at that point. Again I'm unsure of the best direction to take although I think the lymphedema clinic may be appropriate for him. 02/03/19 on evaluation today patient appears to be doing well in regard to the wounds that we saw him for last week unfortunately he has a new area on the proximal portion of his right medial/posterior lower extremity where the wrap somewhat slowed down and caused swelling and a blister to rub and open. Unfortunately this is the only opening that he has on either leg at this point. 02/17/19 on evaluation today patient's bilateral lower extremities appear to be doing well. He still completely healed in regard to the left lower extremity. In regard to the right lower extremity the area where the wrap and slid down and caused the blister  still seems to be slightly open although this is dramatically better than during the last evaluation two weeks ago. I'm very pleased with the way this stands overall. 03/03/19 on evaluation today patient appears to be doing well in regard  to his right lower extremity in general although he did have a new blister open this does not appear to be showing any evidence of active infection at this time. Fortunately there's No fevers, chills, nausea, or vomiting noted at this time. Overall I feel like he is making good progress it does feel like that the right leg will we perform the [employer] seems to do with a bit better than three layer wrap on the left which slid down on him. We may switch to doing bilateral in the book wraps. 5/4; I have not seen Mr. Pareja in quite some time. According to our case manager he did not have an open wound on his left leg last week. He had 1 remaining wound on the right posterior medial calf. He arrives today with multiple openings on the left leg probably were blisters and/or wrap injuries from Unna boots. I do not think the Unna boot's will provide adequate compression on the left. I am also not clear about the frequency he is using the compression pumps. 03/17/19 on evaluation today patient appears to be doing excellent in regard to his lower extremities compared to last week's evaluation apparently. He had gotten significantly worse last week which is unfortunate. The [employer] wrap on the left did not seem to do very well for him at all and in fact it didn't control his swelling significantly enough he had an additional outbreak. Subsequently we go back to the four layer compression wrap on the left. This is good news. At least in that he is doing better and the wound seem to be killing him. He still has not heard anything from the lymphedema clinic. 03/24/19 on evaluation today patient actually appears to be doing much better in regard to his bilateral lower Trinity as compared to last week when I saw him. Fortunately there's no signs of active infection at this time. He has been tolerating the dressing changes without complication. Overall I'm extremely pleased with the progress and appearance in  general. 04/07/19 on evaluation today patient appears to be doing well in regard to his bilateral lower extremities. His swelling is significantly down from where it was previous. With that being said he does have a couple blisters still open at this point but fortunately nothing that seems to be too severe and again the majority of the larger openings has healed at this time. 04/14/19 on evaluation today patient actually appears to be doing quite well in regard to his bilateral lower extremities in fact I'm not even sure there's anything significantly open at this time at any site. Nonetheless he did have some trouble with these wraps where they are somewhat irritating him secondary to the fact that he has noted that the graph wasn't too close down to the end of this foot in a little bit short as well up to his knee. Otherwise things seem to be doing quite well. 04/21/19 upon evaluation today patient's wound bed actually showed evidence of being completely healed in regard to both lower extremities which is excellent news. There does not appear to be any signs of active infection which is also good news. I'm very pleased in this regard. No fevers, chills, nausea, or vomiting noted at this time. 04/28/19  on evaluation today patient appears to be doing a little bit worse in regard to both lower extremities on the left mainly due to the fact that when he went infection disease the wrap was not wrapped quite high enough he developed a blister above this. On the right he is a small open area of nothing too significant but again this is continuing to give him some trouble he has been were in the Velcro compression that he has at home. 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 He's been tolerating the dressing changes without complication. Fortunately there's no signs of active infection at this time. No fevers, chills, nausea, or vomiting noted at  this time. We have been trying to get an appointment with her lymphedema clinic in [name] but unfortunately nobody can get them on phone with not been able to even fax information over the patient likewise is not been able to get in touch with them. Overall I'm not sure exactly what's going on here with to reach out again today. 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 Still having a lot of drainage unfortunately. He tells me especially in the left but even on the right. There's no signs of active infection which is good news we've been using so ratcheted up to this point. 05/19/19 on evaluation today patient actually appears to be doing quite well with regard to his left lower extremity which is great news. Fortunately in regard to the right lower extremity has an issues with his wrap and he subsequently did remove this from what I'm understanding. Nonetheless long story short is what he had rewrapped once he removed it subsequently had maggots underneath this wrap whenever he came in for evaluation today. With that being said they were obviously completely cleaned away by the nursing staff. The visit today which is excellent news. However he does appear to potentially have some infection around the right ankle region where the maggots were located as well. He will likely require anabiotic therapy today. 05/26/19 on evaluation today patient actually appears to be doing much better in regard to his bilateral lower extremities. I feel like the infection is under much better control. With that being said there were maggots noted when the wrap was removed yet again today. Again this could have potentially been left over from previous although at this time there does not appear to be any signs of significant drainage there was obviously on the wrap some drainage as well this contracted gnats or  otherwise. Either way I do not see anything that appears to be doing worse in my pinion and in fact I think his drainage has slowed down quite significantly likely mainly due to the fact to his infection being under better control. 06/02/2019 on evaluation today patient actually appears to be doing well with regard to his bilateral lower extremities there is no signs of active infection at this time which is great news. With that being said he does have several open areas more so on the right than the left but nonetheless these are all significantly better than previously noted. 06/09/2019 on evaluation today patient actually appears to be doing well. His wrap stayed up and he did not cause any problems he had more drainage on the right compared to the left but overall I do not see any major issues at  this time which is great news. 06/16/2019 on evaluation today patient appears to be doing excellent with regard to his lower extremities the only area that is open is a new blister that can have opened as of today on the medial ankle on the left. Other than this he really seems to be doing great I see no major issues at this point. 06/23/2019 on evaluation today patient appears to be doing quite well with regard to his bilateral lower extremities. In fact he actually appears to be almost completely healed there is a small area of weeping noted of the right lower extremity just above the ankle. Nonetheless fortunately there is no signs of active infection at this time which is good news. No fevers, chills, nausea, vomiting, or diarrhea. 8/24; the patient arrived for a nurse visit today but complained of very significant pain in the left leg and therefore I was asked to look at this. Noted that he did not have an open area on the left leg last week nevertheless this was wrapped. The patient states that he is not been able to put his compression pumps on the left leg because of the discomfort. He has not been  systemically unwell 06/30/2019 on evaluation today patient unfortunately despite being excellent last week is doing much worse with regard to his left lower extremity today. In fact he had to come in for a nurse on Monday where his left leg had to be rewrapped due to excessive weeping Dr. Leanord Hawking placed him on doxycycline at that point. Fortunately there is no signs of active infection Systemically at this time which is good news. 07/07/2019 in regard to the patient's wounds today he actually seems to be doing well with his right lower extremity there really is nothing open or draining at this point this is great news. Unfortunately the left lower extremity is given him additional trouble at this time. There does not appear to be any signs of active infection nonetheless he does have a lot of edema and swelling noted at this point as well as blistering all of which has led to a much more poor appearing leg at this time compared to where it was 2 weeks ago when it was almost completely healed. Obviously this is a little discouraging for the patient. He is try to contact the lymphedema clinic in [place] he has not been able to get through to them. 07/14/2019 on evaluation today patient actually appears to be doing slightly better with regard to his left lower extremity ulcers. Overall I do feel like at least at the top of the wrap that we have been placing this area has healed quite nicely and looks much better. The remainder of the leg is showing signs of improvement. Unfortunately in the thigh area he still has an open region on the left and again on the right he has been utilizing just a Band-Aid on an area that also opened on the thigh. Again this is an area that were not able to wrap although we did do an Ace wrap to provide some compression that something that obviously is a little less effective than the compression wraps we have been using on the lower portion of the leg. He does have an  appointment with the lymphedema clinic in [place] on Friday. 07/21/2019 on evaluation today patient appears to be doing better with regard to his lower extremity ulcers. He has been tolerating the dressing changes without complication. Fortunately there is no signs of active  infection at this time. No fevers, chills, nausea, vomiting, or diarrhea. I did receive the paperwork from the physical therapist at the lymphedema clinic in [place]. Subsequently I signed off on that this morning and sent that back to him for further progression with the treatment plan. 07/28/2019 on evaluation today patient appears to be doing very well with regard to his right lower extremity where I do not see any open wounds at this point. Fortunately he is feeling great as far as that is concerned as well. In regard to the left lower extremity he has been having issues with still several areas of weeping and edema although the upper leg is doing better his lower leg still I think is going require the compression wrap at this time. No fevers, chills, nausea, vomiting, or diarrhea. 08/04/2019 on evaluation today patient unfortunately is having new wounds on the right lower extremity. Again we have been using Unna boot wrap on that side. We switched him to using his juxta lite wrap at home. With that being said he tells me he has been using it although his legs extremely swollen and to be honest really does not appear that he has been. I cannot know that for sure however. Nonetheless he has multiple new wounds on the right lower extremity at this time. Obviously we will have to see about getting this rewrapped for him today. 08/11/2019 on evaluation today patient appears to be doing fairly well with regard to his wounds. He has been tolerating the dressing changes including the compression wraps without complication. He still has a lot of edema in his upper thigh regions bilaterally he is supposed to be seeing the  lymphedema clinic on the 15th of this month once his wraps arrive for the upper part of his legs. 08/18/2019 on evaluation today patient appears to be doing well with regard to his bilateral lower extremities at this point. He has been tolerating the dressing changes without complication. Fortunately there is no signs of active infection which is also good news. He does have a couple weeping areas on the first and second toe of the right foot he also has just a small area on the left foot upper leg and a small area on the left lower leg but overall he is doing quite well in my opinion. He is supposed to be getting his wraps shortly in fact tomorrow and then subsequently is seeing the lymphedema clinic next Wednesday on the 21st. Of note he is also leaving on the 25th to go on vacation for a week to the beach. For that reason and since there is some uncertainty about what there can be doing at lymphedema clinic next Wednesday I am get a make an appointment for next Friday here for [place] to see what we need to do for him prior to him leaving for vacation. 10/23; patient arrives in considerable pain predominantly in the upper posterior calf just distal to the popliteal fossa also in the wound anteriorly above the major wound. This is probably cellulitis and he has had this recurrently in the past. He has no open wound on the right side and he has had an Radio broadcast assistant in that area. Finally I note that he has an area on the left posterior calf which by enlarge is mostly epithelialized. This protrudes beyond the borders of the surrounding skin in the setting of dry scaly skin and lymphedema. The patient is leaving for [name] on Sunday. Per his longstanding pattern, he  will not take his compression pumps with him predominantly out of fear that they will be stolen. He therefore asked that we put a Unna boot back on the right leg. He will also [place] [facility] [name] to see if they can  change his dressing in the mid week. 11/3; patient returned [place] his [place] [facility] [name]. He was seen on 1 occasion at their wound care [name]. They did a 2 layer compression system as they did not have our 4-layer wrap. I am not completely certain what they put on the wounds. They did not change the Unna boot on the right. The patient is also seeing a lymphedema specialist physical [place]. It appears that he has some compression sleeve for his thighs which indeed look quite a bit better than I am used to seeing. He pumps over these with his external compression pumps. 11/10; the patient has a new wound on the right medial thigh otherwise there is no open areas on the right. He has an area on the left leg posteriorly anteriorly and medially and an area over the left second toe. We have been using silver alginate. He thinks the injury on his thigh is secondary to friction from the compression sleeve he has. 11/17; the patient has a new wound on the right medial thigh last week. He thinks this is because he did not have a underlying stocking for his thigh juxta lite apparatus. He now has this. The area is fairly large and somewhat angry but I do not think he has underlying cellulitis. He has a intact blister on the right anterior tibial area. Small wound on the right great toe dorsally Small area on the medial left calf. 11/30; the patient does not have any open areas on his right leg and we did not take his juxta lite stocking off. However he states that on Friday his compression wrap fell down lodging around his upper mid calf area. As usual this creates a lot of problems for him. He called urgently today to be seen for a nurse visit however the nurse visit turned into a provider visit because of extreme erythema and pain in the left anterior tibia extending laterally and posteriorly. The area that is problematic is extensive 10/06/2019 upon evaluation today patient actually  appears to be doing poorly in regard to his left lower extremity. He Dr. Leanord Hawkingobson did place him on doxycycline this past Monday apparently due to the fact that he was doing much worse in regard to this left leg. Fortunately the doxycycline does seem to be helping. Unfortunately we are still having a very difficult time getting his edema under any type of control in order to anticipate discharge at some point. The only way were really able to control his lymphedema really is with compression wraps and that has only even seemingly temporary. He has been seeing a lymphedema clinic they are trying to help in this regard but still this has been somewhat frustrating in general for the patient. 10/13/19 on evaluation today patient appears to be doing excellent with regard to his right lower extremity as far as the wounds are concerned. His swelling is still quite extensive unfortunately. He is still having a lot of drainage from the thigh areas bilaterally which is unfortunate. He's been going to lymphedema clinic but again he still really does not have this edema under control as far as his lower extremities are concern. With regard to his left lower  extremity this seems to be improving and I do believe the doxycycline has been of benefit for him. He is about to complete the doxycycline. 10/20/2019 on evaluation today patient appears to be doing poorly in regard to his bilateral lower extremities. More in the right thigh he has a lot of irritation at this site unfortunately. In regard to the left lower extremity the wrap was not quite as high it appears and does seem to have caused him some trouble as well. Fortunately there is no evidence of systemic infection though he does have some blue-green drainage which has me concerned for the possibility of Pseudomonas. He tells me he is previously taking Cipro without complications and he really does not care for Levaquin however due to some of the side effects he  has. He is not allergic to any medications specifically antibiotics that were aware of. 10/27/2019 on evaluation today patient actually does appear to be for the most part doing better when compared to last week's evaluation. With that being said he still has multiple open wounds over the bilateral lower extremities. He actually forgot to start taking the Cipro and states that he still has the whole bottle. He does have several new blisters on left lower extremity today I think I would recommend he go ahead and take the Cipro based on what I am seeing at this point. 12/30-Patient comes at 1 week visit, 4 layer compression wraps on the left and Unna boot on the right, primary dressing Xtrasorb and silver alginate. Patient is taking his Cipro and has a few more days left probably 5-6, and the legs are doing better. He states he is using his compressions devices which I believe he has 11/10/2019 on evaluation today patient actually appears to be much better than last time I saw him 2 weeks ago. His wounds are significantly improved and overall I am very pleased in this regard. Fortunately there is no signs of active infection at this time. He is just a couple of days away from completing Cipro. Overall his edema is much better he has been using his lymphedema pumps which I think is also helping at this point. 11/17/2019 on evaluation today patient appears to be doing excellent in regard to his wounds in general. His legs are swollen but not nearly as much as they have been in the past. Fortunately he is tolerating the compression wraps without complication. No fevers, chills, nausea, vomiting, or diarrhea. He does have some erythema however in the distal portion of his right lower extremity specifically around the forefoot and toes there is a little bit of warmth here as well. 11/24/2019 on evaluation today patient appears to be doing well with regard to his right lower extremity I really do not see any open  wounds at this point. His left lower extremity does have several open areas and his right medial thigh also is open. Other than this however overall the patient seems to be making good progress and I am very pleased at this point. 12/01/2019 on evaluation today patient appears to be doing poorly at this point in regard to his left lower extremity has several new blisters despite the fact that we have him in compression wraps. In fact he had a 4-layer compression wrap, his upper thigh wrapped from lymphedema clinic, and a juxta light over top of the 4 layer compression wrap the lymphedema clinic applied and despite all this he still develop blisters underneath. Obviously this does have me concerned about  the fact that unfortunately despite what we are doing to try to get wounds healed he continues to have new areas arise I do not think he is ever good to be at the point where he can realistically just use wraps at home to keep things under control. Typically when we heal him it takes about 1-2 days before he is back in the clinic with severe breakdown and blistering of his lower extremities bilaterally. This is happened numerous times in the past. Unfortunately I think that we may need some help as far as overall fluid overload to kind of limit what we are seeing and get things under better control. 12/08/2019 on evaluation today patient presents for follow-up concerning his ongoing bilateral lower extremity edema. Unfortunately he is still having quite a bit of swelling the compression wraps are controlling this to some degree but he did see Dr. [name] his cardiologist I do have that available for review today as far as the appointment was concerned that was on 12/06/2019. Obviously that she has been 2 days ago. The patient states that he is only been taking the Lasix 80 mg 1 time a day he had told me previously he was taking this twice a day. Nonetheless Dr. [name] recommended this be up to 80 mg 2 times a  day for the patient as he did appear to be fluid overloaded. With that being said the patient states he did this yesterday and he was unable to go anywhere or do anything due to the fact that he was constantly having to urinate. Nonetheless I think that this is still good to be something that is important for him as far as trying to get his edema under control at all things that he is going to be able to just expect his wounds to get under control and things to be better without going through at least a period of time where he is trying to stabilize his fluid management in general and I think increasing the Lasix is likely the first step here. It was also mentioned the possibility that the patient may require metolazone. With that being said he wanted to have the patient take Lasix twice a day first and then reevaluating 2 months to see where things stand. 12/15/2019 upon evaluation today patient appears to be doing regard to his legs although his toes are showing some signs of weeping especially on the left at this point to some degree on the right. There does not appear to be any signs of active infection and overall I do feel like the compression wraps are doing well for him but he has not been able to take the Lasix at home and the increased dose that Dr. [name] recommended. He tells me that just not go to be feasible for him. Nonetheless I think in this case he should probably send a message to Dr. [name] in order to discuss options from the standpoint of possible admission to get the fluid off or otherwise going forward. 12/22/2019 upon evaluation today patient appears to be doing fairly well with regard to his lower extremities at this point. In fact he would be doing excellent if it was not for the fact that his right anterior thigh apparently had an allergic reaction to adhesive tape that he used. The wound itself that we have been monitoring actually appears to be healed. There is a lot of  irritation at this point. 12/29/2019 upon evaluation today patient appears to be doing well in  regard to his lower extremities. His left medial thigh is open and somewhat draining today but this is the only region that is open the right has done much better with the treatment utilizing the steroid cream that I prescribed for him last week. Overall I am pleased in that regard. Fortunately there is no signs of active infection at this time. No fevers, chills, nausea, vomiting, or diarrhea. 01/05/2020 upon evaluation today patient appears to be doing more poorly in regard to his right lower extremity at this point upon evaluation today. Unfortunately he continues to have issues in this regard and I think the biggest issue is controlling his edema. This obviously is not very well controlled at this point is been recommended that he use the Lasix twice a day but he has not been able to do that. Unfortunately I think this is leading to an issue where honestly he is not really able to effectively control his edema and therefore the wounds really are not doing significantly better. I do not think that he is going to be able to keep things under good control unless he is able to control his edema much better. I discussed this again in great detail with him today. 01/12/2020 good news is patient actually appears to be doing quite well today at this point. He does have an appointment with lymphedema clinic tomorrow. His legs appear healed and the toe on the left is almost completely healed. In general I am very pleased with how things stand at this point. 01/19/2020 upon evaluation today patient appears to actually be doing well in regard to his lower extremities there is nothing open at this point. Fortunately he has done extremely well more recently. Has been seeing lymphedema clinic as well. With that being said he has Velcro wraps for his lower legs as well as his upper legs. The only wound really is on his toe  which is the right great toe and this is barely anything even there. With all that being said I think it is good to be appropriate today to go ahead and switch him over to the Velcro compression wraps. 01/26/2020 upon evaluation today patient appears to be doing worse with regard to his lower extremities after last week switch him to Velcro compression wraps. Unfortunately he lasted less than 24 hours he did not have the sock portion of his Velcro wrap on the left leg and subsequently developed a blister underneath the Velcro portion. Obviously this is not good and not what we were looking for at this point. He states the lymphedema clinic did tell him to wear the wrap for 23 hours and take him off for 1 I am okay with that plan but again right now we got a get things back under control again he may have some cellulitis noted as well. 02/02/2020 upon evaluation today patient unfortunately appears to have several areas of blistering on his bilateral lower extremities today mainly on the feet. His legs do seem to be doing somewhat better which is good news. Fortunately there is no evidence of active infection at this time. No fevers, chills, nausea, vomiting, or diarrhea. 02/16/2020 upon evaluation today patient appears to be doing well at this time with regard to his legs. He has a couple weeping areas on his toes but for the most part everything is doing better and does appear to be sealed up on his legs which is excellent news. We can continue with wrapping him at this point as  he had every time we discontinue the wraps he just breaks out with new wounds. There is really no point in is going forward with this at this point. 03/08/2020 upon evaluation today patient actually appears to be doing quite well with regard to his lower extremity ulcers. He has just a very superficial and really almost nonexistent blister on the left lower extremity he has in general done very well with the compression wraps. With  that being said I do not see any signs of infection at this time which is good news. 03/29/2020 upon evaluation today patient appears to be doing well with regard to his wounds currently except for where he had several new areas that opened up due to some of the wrap slipping and causing him trouble. He states he did not realize they had slipped. Nonetheless he has a 1 area on the right and 3 new areas on the left. Fortunately there is no signs of active infection at this time which is great news. 04/05/2020 upon evaluation today patient actually appears to be doing quite well in general in regard to his legs currently. Fortunately there is no signs of active infection at this time. No fevers, chills, nausea, vomiting, or diarrhea. He tells me next week that he will actually be seen in the lymphedema clinic on Thursday at 10 AM I see him on Wednesday next week. 04/12/2020 upon evaluation today patient appears to be doing very well with regard to his lower extremities bilaterally. In fact he does not appear to have any open wounds at this point which is good news. Fortunately there is no signs of active infection at this time. No fevers, chills, nausea, vomiting, or diarrhea. 04/19/2020 upon evaluation today patient appears to be doing well with regard to his wounds currently on the bilateral lower extremities. There does not appear to be any signs of active infection at this time. Fortunately there is no evidence of systemic infection and overall very pleased at this point. Nonetheless after I held him out last week he literally had blisters the next morning already which swelled up with him being right back here in the clinic. Overall I think that he is just not can be able to be discharged with his legs the way they are he is much to volume overloaded as far as fluid is concerned and that was discussed with him today of also discussed this but should try the clinic nurse manager as well as Dr.  Leanord Hawking. 04/26/2020 upon evaluation today patient appears to be doing better with regard to his wounds currently. He is making some progress and overall swelling is under good control with the compression wraps. Fortunately there is no evidence of active infection at this time. 05/10/2020 on evaluation today patient appears to be doing overall well in regard to his lower extremities bilaterally. He is Tolerating the compression wraps without complication and with what we are seeing currently I feel like that he is making excellent progress. There is no signs of active infection at this time. 05/24/2020 upon evaluation today patient appears to be doing well in regard to his legs. The swelling is actually quite a bit down compared to where it has been in the past. Fortunately there is no sign of active infection at this time which is also good news. With that being said he does have several wounds on his toes that have opened up at this point. 05/31/2020 upon evaluation today patient appears to be doing well with  regard to his legs bilaterally where he really has no significant fluid buildup at this point overall he seems to be doing quite well. Very pleased in this regard. With regard to his toes these also seem to be drying up which is excellent. We have continue to wrap him as every time we tried as a transition to the juxta light wraps things just do not seem to get any better. 06/07/2020 upon evaluation today patient appears to be doing well with regard to his right leg at this point. Unfortunately left leg has a lot of blistering he tells me the wrap started to slide down on him when he tried to put his other Velcro wrap over top of it to help keep things in order but nonetheless still had some issues. 06/14/2020 on evaluation today patient appears to be doing well with regard to his lower extremity ulcers and foot ulcers at this point. I feel like everything is actually showing signs of improvement which  is great news overall there is no signs of active infection at this time. No fevers, chills, nausea, vomiting, or diarrhea. 06/21/2020 on evaluation today patient actually appears to be doing okay in regard to his wounds in general. With that being said the biggest issue I see is on his right foot in particular the first and second toe seem to be doing a little worse due to the fact this is staying very wet. I think he is probably getting need to change out his dressings a couple times in between each week when we see him in regard to his toes in order to keep this drier based on the location and how this is proceeding. 06/28/2020 on evaluation today patient appears to be doing a little bit more poorly overall in regard to the appearance of the skin I am actually somewhat concerned about the possibility of him having a little bit of an infection here. We discussed the course of potentially giving him a doxycycline prescription which he is taken previously with good result. With that being said I do believe that this is potentially mild and at this point easily fixed. I just do not want anything to get any worse. 07/12/2020 upon evaluation today patient actually appears to be making some progress with regard to his legs which is great news there does not appear to be any evidence of active infection. Overall very pleased with where things stand. 07/26/2020 upon evaluation today patient appears to be doing well with regard to his leg ulcers and toe ulcers at this point. He has been tolerating the compression wraps without complication overall very pleased in this regard. 08/09/2020 upon evaluation today patient appears to be doing well with regard to his lower extremities bilaterally. Fortunately there is no signs of active infection overall I am pleased with where things stand. 08/23/2020 on evaluation today patient appears to be doing well with regard to his wound. He has been tolerating the dressing  changes without complication. Fortunately there is no signs of active infection at this time. Overall his legs seem to be doing quite well which is great news and I am very pleased in that regard. No fevers, chills, nausea, vomiting, or diarrhea. 09/13/2020 upon evaluation today patient appears to be doing okay in regard to his lower extremities. He does have a fairly large blister on the right leg which I did remove the blister tissue from today so we can get this to dry out other than that however he seems  to be doing quite well. There is no signs of active infection at this time. 09/27/2020 upon evaluation today patient appears to actually be doing some better in regard to his right leg. Fortunately signs of active infection at this time which is great news. No fevers, chills, nausea, vomiting, or diarrhea. 10/04/2020 upon evaluation today patient actually appears to be showing signs of improvement which is great news with regard to his leg ulcers. Fortunately there is no signs of active infection which is great news he is still taking the antibiotics currently. No fevers, chills, nausea, vomiting, or diarrhea. 10/18/2020 on evaluation today patient appears to be doing well with regard to his legs currently. He has been tolerating the dressing changes including the wraps without complication. Fortunately there is no signs of active infection at this time. No fevers, chills, nausea, vomiting, or diarrhea. 10/25/2020 upon evaluation today patient appears to be doing decently well in regard to his wounds currently. He has been tolerating the dressing changes without complication. Overall I feel like he is making good progress albeit slow. Again this is something we can have to continue to wrap for some time to come most likely. Electronic Signature(s) Signed: 10/25/2020 1:22:16 PM By: Kevin Kelp PA-C Entered By: [name] on 10/25/2020  13:22:16 -------------------------------------------------------------------------------- Physical Exam Details Patient Name: Date of Service: [name], [name] 10/25/2020 10:30 A M Medical Record Number: [id number] Patient Account Number: [id number] Date of Birth/Sex: Treating RN: 1951-04-22 (70 y.o. [name] Primary Care Provider: Nicoletta [name] Other Clinician: Referring Provider: Treating Provider/Extender: [name] in Treatment: 248 Constitutional Obese and well-hydrated in no acute distress. Respiratory normal breathing without difficulty. Psychiatric this patient is able to make decisions and demonstrates good insight into disease process. Alert and Oriented x 3. pleasant and cooperative. Notes Upon inspection patient's wounds actually are showing signs of fairly good improvement which is great news. I do not see any signs of active infection which is also excellent and in general I think that the weeping on the right leg is to be expected again he has issues like this intermittently. I do not see anything that seems to be ominous however. Electronic Signature(s) Signed: 10/25/2020 1:22:51 PM By: Kevin Kelp PA-C Entered By: [name] on 10/25/2020 13:22:50 -------------------------------------------------------------------------------- Physician Orders Details Patient Name: Date of Service: [name], [name] 10/25/2020 10:30 A M Medical Record Number: [id number] Patient Account Number: [id number] Date of Birth/Sex: Treating RN: 07-Mar-1951 (69 y.o. 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 Nurse Visit: - 12/29 and 1/5 Bathing/ Shower/ Hygiene May shower with protection but do not get wound dressing(s) wet. Edema Control - Lymphedema / SCD / Other Bilateral Lower Extremities Lymphedema Pumps. Use Lymphedema pumps on leg(s) 2-3 times a day for 45-60 minutes. If wearing any wraps or hose, do not remove them. Continue exercising as instructed. Elevate legs to  the level of the heart or above for 30 minutes daily and/or when sitting, a frequency of: - throughout the day Avoid standing for long periods of time. Exercise regularly Other Edema Control Orders/Instructions: - 4 layer compression wrap to left leg Non Wound Condition Left Lower Extremity Other Non Wound Condition Orders/Instructions: - lotion to leg, 4 layer compression wrap Wound Treatment Wound #185 - Lower Leg Wound Laterality: Right, Lateral Peri-Wound Care: Sween Lotion (Moisturizing lotion) 1 x Per Week Discharge Instructions: Apply moisturizing lotion as directed Prim Dressing: KerraCel Ag Gelling Fiber Dressing, 4x5 in (silver alginate) 1 x Per Week ary Discharge Instructions: Apply silver alginate to wound bed as instructed Secondary Dressing: ABD Pad, 8x10 1 x Per Week Discharge Instructions: Apply over primary dressing as directed. Compression Wrap: Unnaboot w/Calamine, 4x10 (in/yd) 1 x Per Week Discharge Instructions: Apply Unnaboot as directed. Wound #189 - Upper Arm Wound Laterality: Dorsal, Right Prim Dressing: Xeroform Occlusive Gauze Dressing, 4x4 in Every Other Day ary Discharge Instructions: Apply to wound  bed as instructed Secondary Dressing: ComfortFoam Border, 3x3 in (silicone border) Every Other Day Discharge Instructions: Apply over primary dressing as directed. Electronic Signature(s) Signed: 10/25/2020 5:13:59 PM By: Kevin Kelp PA-C Signed: 10/25/2020 7:00:57 PM By: [name] RN, BSN Entered By: [name] on 10/25/2020 12:10:11 -------------------------------------------------------------------------------- Problem List Details Patient Name: Date of Service: [name], [name] 10/25/2020 10:30 A M Medical Record Number: [id number] Patient Account Number: [id number] Date of Birth/Sex: Treating RN: 1950/11/24 (69 y.o. [name] Primary Care Provider: Nicoletta [name] Other Clinician: Referring Provider: Treating Provider/Extender: [name] in Treatment: 248 Active Problems ICD-10 Encounter Code Description Active Date MDM Diagnosis 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 skin 06/30/2018 No Yes L97.221 Non-pressure chronic ulcer of left calf limited to breakdown of skin 09/30/2016 No Yes 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 of 01/22/2016 No Yes bilateral lower extremity I89.0 Lymphedema, not elsewhere classified 01/22/2016 No Yes E11.622 Type 2 diabetes mellitus with other skin ulcer 01/22/2016 No Yes 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, unspecified 01/22/2016 No Yes L03.116 Cellulitis of left lower limb 04/01/2017 No Yes Inactive Problems ICD-10 Code Description Active Date Inactive Date 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 skin 06/30/2017 06/30/2017 L97.521 Non-pressure chronic ulcer of other 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 of skin 04/27/2018 04/27/2018 L03.115 Cellulitis of right lower limb 12/22/2017 12/22/2017 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 06/30/2018  06/30/2018 L97.511 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 of skin 06/30/2018 06/30/2018 Resolved Problems Electronic Signature(s) Signed: 10/25/2020 10:19:28 AM By: Kevin Kelp PA-C Entered By: [name] on 10/25/2020 10:19:26 -------------------------------------------------------------------------------- Progress Note Details Patient Name: Date of Service: [name], [name] 10/25/2020 10:30 A M Medical Record Number: [id number] Patient Account Number: [id number] Date of Birth/Sex: Treating RN: 06-06-1951 (69 y.o. [name] Primary Care Provider: Nicoletta [name] Other Clinician: Referring Provider: Treating Provider/Extender: [name] in Treatment: [phone] Subjective Chief Complaint Information obtained from Patient patient is here for evaluation venous/lymphedema weeping History of Present Illness (HPI) Referred by PCP for consultation. Patient has long standing history of BLE venous stasis, no prior ulcerations. At beginning of month, developed cellulitis and weeping. Received IM Rocephin followed by Keflex and resolved. Wears compression stocking, appr 6 months old. Not sure strength. No present drainage. 01/22/16 this  is a patient who is a type II diabetic on insulin. 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 He tells me he religiously wears pressure stockings of uncertain strength. He was here with weeping edema about 8 months ago but did not have an open wound. Roughly a month ago he had a reopening on his bilateral legs. He is been using bandages and Neosporin. He does not complain of pain. 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 Last BUN/creatinine I have is from 11/20/15 at 13 and 1.0 respectively 01/29/16; patient arrives today having tolerated the Profore  wrap. He brought in his stockings and these are 18 mmHg stockings he bought from [employer]. The compression here is likely inadequate. He does not complain of pain or excessive drainage she has no systemic symptoms. The wound on the right looks improved as does the one on the left although one on the left is more substantial with still tissue at risk below the actual wound area on the bilateral posterior calf 02/05/16; patient arrives with poor edema control. He states that we did put a 4 layer compression on it last week. No weight appear 5 this. 02/12/16; the area on the posterior right Has healed. The left Has a substantial wound that has necrotic surface eschar that requires a debridement with a curette. 02/16/16;the patient called or a Nurse visit secondary to increased swelling. He had been in earlier in the week with his right leg healed. He was transitioned to is on pressure stocking on the right leg with the only open wound on the left, a substantial area on the left posterior calf. Note he has a history of severe lower extremity edema, he has a history of chronic atrial fibrillation but not heart failure per my notes but I'll need to research this. He is not complaining of chest pain shortness of breath or orthopnea. The intake nurse noted blisters on the previously closed right leg 02/19/16; this is the patient's regular visit day. I see him on Friday with escalating edema new wounds on the right leg and clear signs of at least right ventricular heart failure. I increased his Lasix to 40 twice a day. He is returning currently in follow-up. States he is noticed a decrease in that the edema 02/26/16 patient's legs have much less edema. There is nothing really open on the right leg. The left leg has improved condition of the large superficial wound on the posterior left leg 03/04/16; edema control is very much better. The patient's right leg wounds have healed. On the left leg he continues to have  severe venous inflammation on the posterior aspect of the left leg. There is no tenderness and I don't think any of this is cellulitis. 03/11/16; patient's right leg is married healed and he is in his own stocking. The patient's left leg has deteriorated somewhat. There is a lot of erythema around the wound on the posterior left leg. There is also a significant rim of erythema posteriorly just above where the wrap would've ended there is a new wound in this location and a lot of tenderness. Can't rule out cellulitis in this area. 03/15/16; patient's right leg remains healed and he is in his own stocking. The patient's left leg is much better than last review. His major wound on the posterior aspect of his left Is almost fully epithelialized. He has  3 small injuries from the wraps. Really. Erythema seems a lot better on antibiotics 03/18/16; right leg remains healed and he is in his own stocking. The patient's left leg is much better. The area on the posterior aspect of the left calf is fully epithelialized. His 3 small injuries which were wrap injuries on the left are improved only one seems still open his erythema has resolved 03/25/16; patient's right leg remains healed and he is in his own stocking. There is no open area today on the left leg posterior leg is completely closed up. His wrap injuries at the superior aspect of his leg are also resolved. He looks as though he has some irritation on the dorsal ankle but this is fully epithelialized without evidence of infection. 03/28/16; we discharged this patient on Monday. Transitioned him into his own stocking. There were problems almost immediately with uncontrolled swelling weeping edema multiple some of which have opened. He does not feel systemically unwell in particular no chest pain no shortness of breath and he does not feel 04/08/16; the edema is under better control with the Profore light wrap but he still has pitting edema. There is one large  wound anteriorly 2 on the medial aspect of his left leg and 3 small areas on the superior posterior calf. Drainage is not excessive he is tolerating a Profore light well 04/15/16; put a Profore wrap on him last week. This is controlled is edema however he had a lot of pain on his left anterior foot most of his wounds are healed 04/22/16 once again the patient has denuded areas on the left anterior foot which he states are because his wrap slips up word. He saw his primary physician today is on Lasix 40 twice a day and states that he his weight is down 20 pounds over the last 3 months. 04/29/16: Much improved. left anterior foot much improved. He is now on Lasix 80 mg per day. Much improved edema control 05/06/16; I was hoping to be able to discharge him today however once again he has blisters at a low level of where the compression was placed last week mostly on his left lateral but also his left medial leg and a small area on the anterior part of the left foot. 05/09/16; apparently the patient went home after his appointment on 7/4 later in the evening developing pain in his upper medial thigh together with subjective fever and chills although his temperature was not taken. The pain was so intense he felt he would probably have to call 911. However he then remembered that he had leftover doxycycline from a previous round of antibiotics and took these. By the next morning he felt a lot better. He called and spoke to one of our nurses and I approved doxycycline over the phone thinking that this was in relation to the wounds we had previously seen although they were definitely were not. The patient feels a lot better old fever no chills he is still working. Blood sugars are reasonably controlled 05/13/16; patient is back in for review of his cellulitis on his anterior medial upper thigh. He is taking doxycycline this is a lot better. Culture I did of the nodular area on the dorsal aspect of his foot grew MRSA  this also looks a lot better. 05/20/16; the patient is cellulitis on the medial upper thigh has resolved. All of his wound areas including the left anterior foot, areas on the medial aspect of the left calf and the  lateral aspect of the calf at all resolved. He has a new blister on the left dorsal foot at the level of the fourth toe this was excised. No evidence of infection 05/27/16; patient continues to complain weeping edema. He has new blisterlike wounds on the left anterior lateral and posterior lateral calf at the top of his wrap levels. The area on his left anterior foot appears better. He is not complaining of fever, pain or pruritus in his feet. 05/30/16; the patient's blisters on his left anterior leg posterior calf all look improved. He did not increase the Lasix 100 mg as I suggested because he was going to run out of his 40 mg tablets. He is still having weeping edema of his toes 06/03/16; I renewed his Lasix at 80 mg once a day as he was about to run out when I last saw him. He is on 80 mg of Lasix now. I have asked him to cut down on the excessive amount of water he was drinking and asked him to drink according to his thirst mechanisms 06/12/2016 -- was seen 2 days ago and was supposed to wear his compression stockings at home but he is developed lymphedema and superficial blisters on the left lower extremity and hence came in for a review 06/24/16; the remaining wound is on his left anterior leg. He still has edema coming from between his toes. There is lymphedema here however his edema is generally better than when I last saw this. He has a history of atrial fibrillation but does not have a known history of congestive heart failure nevertheless I think he probably has this at least on a diastolic basis. 07/01/16 I reviewed his echocardiogram from January 2017. This was essentially normal. He did not have LVH, EF of 55-60%. His right ventricular function was normal although he did have  trivial tricuspid and pulmonic regurgitation. This is not audible on exam however. I increased his Lasix to do massive edema in his legs well above his knees I think in early July. He was also drinking an excessive amount of water at the time. 07/15/16; missed his appointment last week because of the Labor Day holiday on Monday. He could not get another appointment later in the week. Started to feel the wrap digging in superiorly so we remove the top half and the bottom half of his wrap. He has extensive erythema and blistering superiorly in the left leg. Very tender. Very swollen. Edema in his foot with leaking edema fluid. He has not been systemically unwell 07/22/16; the area on the left leg laterally required some debridement. The medial wounds look more stable. His wrap injury wounds appear to have healed. Edema and his foot is better, weeping edema is also better. He tells me he is meeting with the supplier of the external compression pumps at work 08/05/16; the patient was on vacation last week in [facility]. His wrap is been on for an extended period of time. Also over the weekend he developed an extensive area of tender erythema across his anterior medial thigh. He took to doxycycline yesterday that he had leftover from a previous prescription. The patient complains of weeping edema coming out of his toes 08/08/16; I saw this patient on 10/2. He was tender across his anterior thigh. I put him on doxycycline. He returns today in follow-up. He does not have any open wounds on his lower leg, he still has edema weeping into his toes. 08/12/16; patient was seen back urgently  today to follow-up for his extensive left thigh cellulitis/erysipelas. He comes back with a lot less swelling and erythema pain is much better. I believe I gave him Augmentin and Cipro. His wrap was cut down as he stated a roll down his legs. He developed blistering above the level of the wrap that remained. He has 2 open blisters  and 1 intact. 08/19/16; patient is been doing his primary doctor who is increased his Lasix from 40-80 once a day or 80 already has less edema. Cellulitis has remained improved in the left thigh. 2 open areas on the posterior left calf 08/26/16; he returns today having new open blisters on the anterior part of his left leg. He has his compression pumps but is not yet been shown how to use some vital representative from the supplier. 09/02/16 patient returns today with no open wounds on the left leg. Some maceration in his plantar toes 09/10/2016 -- Dr. Leanord Hawking had recently discharged him on 09/02/2016 and he has come right back with redness swelling and some open ulcers on his left lower extremity. He says this was caused by trying to apply his compression stockings and he's been unable to use this and has not been able to use his lymphedema pumps. He had some doxycycline leftover and he has started on this a few days ago. 09/16/16; there are no open wounds on his leg on the left and no evidence of cellulitis. 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 He does not complain of symptoms here. I am not clear use using his external compression pumps. 09/23/16; I have not seen this patient in 2 weeks. He canceled his appointment 10 days ago as he was going on vacation. He tells me that on Monday he noticed a large area on his posterior left leg which is been draining copiously and is reopened into a large wound. He is been using ABDs and the external part of his juxtalite, according to our nurse this was not on properly. 10/07/16; Still a substantial area on the posterior left leg. Using silver alginate 10/14/16; in general better although there is still open area which looks healthy. Still using silver alginate. He reminds me that this happen before he left for [name] T oday while he was showering in the morning. He had been using his  juxtalite's 10/21/16; the area on his posterior left leg is fully epithelialized. However he arrives today with a large area of tender erythema in his medial and posterior left thigh just above the knee. I have marked the area. Once again he is reluctant to consider hospitalization. I treated him with oral antibiotics in the past for a similar situation with resolution I think with doxycycline however this area it seems more extensive to me. He is not complaining of fever but does have chills and says states he is thirsty. His blood sugar today was in the 140s at home 10/25/16 the area on his posterior left leg is fully epithelialized although there is still some weeping edema. The large area of tenderness and erythema in his medial and posterior left thigh is a lot less tender although there is still a lot of swelling in this thigh. He states he feels a lot better. He is on doxycycline and Augmentin that I started last week. This will continued until Tuesday, December 26. I have ordered a duplex ultrasound of the left thigh rule out DVT whether there is an  abscess something that would need to be drained I would also like to know. 11/01/16; he still has weeping edema from a not fully epithelialized area on his left posterior calf. Most of the rest of this looks a lot better. He has completed his antibiotics. His thigh is a lot better. Duplex ultrasound did not show a DVT in the thigh 11/08/16; he comes in today with more Denuded surface epithelium from the posterior aspect of his calf. There is no real evidence of cellulitis. The superior aspect of his wrap appears to have put quite an indentation in his leg just below the knee and this may have contributed. He does not complain of pain or fever. We have been using silver alginate as the primary dressing. The area of cellulitis in the right thigh has totally resolved. He has been using his compression stockings once a week 11/15/16; the patient arrives  today with more loss of epithelium from the posterior aspect of his left calf. He now has a fairly substantial wound in this area. The reason behind this deterioration isn't exactly clear although his edema is not well controlled. He states he feels he is generally more swollen systemically. He is not complaining of chest pain shortness of breath fever. T me he has an appointment with his primary physician in early February. He is on 80 mg of oral ells Lasix a day. He claims compliance with the external compression pumps. He is not having any pain in his legs similar to what he has with his recurrent cellulitis 11/22/16; the patient arrives a follow-up of his large area on his left lateral calf. This looks somewhat better today. He came in earlier in the week for a dressing change since I saw him a week ago. He is not complaining of any pain no shortness of breath no chest pain 11/28/16; the patient arrives for follow-up of his large area on the left lateral calf this does not look better. In fact it is larger weeping edema. The surface of the wound does not look too bad. We have been using silver alginate although I'm not certain that this is a dressing issue. 12/05/16; again the patient follows up for a large wound on the left lateral and left posterior calf this does not look better. There continues to be weeping edema necrotic surface tissue. More worrisome than this once again there is erythema below the wound involving the distal Achilles and heel suggestive of cellulitis. He is on his feet working most of the day of this is not going well. We are changing his dressing twice a week to facilitate the drainage. 12/12/16; not much change in the overall dimensions of the large area on the left posterior calf. This is very inflamed looking. I gave him an. Doxycycline last week does not really seem to have helped. He found the wrap very painful indeed it seems to of dog into his legs superiorly and perhaps  around the heel. He came in early today because the drainage had soaked through his dressings. 12/19/16- patient arrives for follow-up evaluation of his left lower extremity ulcers. He states that he is using his lymphedema pumps once daily when there is "no drainage". He admits to not using his lipedema pumps while under current treatment. His blood sugars have been consistently between 150-200. 12/26/16; the patient is not using his compression pumps at home because of the wetness on his feet. I've advised him that I think it's important for him to use  this daily. He finds his feet too wet, he can put a plastic bag over his legs while he is in the pumps. Otherwise I think will be in a vicious circle. We are using silver alginate to the major area on his left posterior calf 01/02/17; the patient's posterior left leg has further of all into 3 open wounds. All of them covered with a necrotic surface. He claims to be using his compression pumps once a day. His edema control is marginal. Continue with silver alginate 01/10/17; the patient's left posterior leg actually looks somewhat better. There is less edema, less erythema. Still has 3 open areas covered with a necrotic surface requiring debridement. He claims to be using his compression pumps once a day his edema control is better 01/17/17; the patient's left posterior calf look better last week when I saw him and his wrap was changed 2 days ago. He has noted increasing pain in the left heel and arrives today with much larger wounds extensive erythema extending down into the entire heel area especially tender medially. He is not systemically unwell CBGs have been controlled no fever. Our intake nurse showed me limegreen drainage on his AVD pads. 01/24/17; his usual this patient responds nicely to antibiotics last week giving him Levaquin for presumed Pseudomonas. The whole entire posterior part of his leg is much better much less inflamed and in the case of  his Achilles heel area much less tender. He has also had some epithelialization posteriorly there are still open areas here and still draining but overall considerably better 01/31/17- He has continue to tolerate the compression wraps. he states that he continues to use the lymphedema pumps daily, and can increase to twice daily on the weekends. He is voicing no complaints or concerns regarding his LLE ulcers 02/07/17-he is here for follow-up evaluation. He states that he noted some erythema to the left medial and anterior thigh, which he states is new as of yesterday. He is concerned about recurrent cellulitis. He states his blood sugars have been slightly elevated, this morning in the 180s 02/14/17; he is here for follow-up evaluation. When he was last here there was erythema superiorly from his posterior wound in his anterior thigh. He was prescribed Levaquin however a culture of the wound surface grew MRSA over the phone I changed him to doxycycline on Monday and things seem to be a lot better. 02/24/17; patient missed his appointment on Friday therefore we changed his nurse visit into a physician visit today. Still using silver alginate on the large area of the posterior left thigh. He isn't new area on the dorsal left second toe 03/03/17; actually better today although he admits he has not used his external compression pumps in the last 2 days or so because of work responsibilities over the weekend. 03/10/17; continued improvement. External compression pumps once a day almost all of his wounds have closed on the posterior left calf. Better edema control 03/17/17; in general improved. He still has 3 small open areas on the lateral aspect of his left leg however most of the area on the posterior part of his leg is epithelialized. He has better edema control. He has an ABD pad under his stocking on the right anterior lower leg although he did not let us look at that today. 03/24/17; patient arrives back in  clinic today with no open areas however there are areas on the posterior left calf and anterior left calf that are less than 100% epithelialized. His edema  is well controlled in the left lower leg. There is some pitting edema probably lymphedema in the left upper thigh. He uses compression pumps at home once per day. I tried to get him to do this twice a day although he is very reticent. 04/01/2017 -- for the last 2 days he's had significant redness, tenderness and weeping and came in for an urgent visit today. 04/07/17; patient still has 6 more days of doxycycline. He was seen by Dr. Meyer Russel last Wednesday for cellulitis involving the posterior aspect, lateral aspect of his Involving his heel. For the most part he is better there is less erythema and less weeping. He has been on his feet for 12 hours o2 over the weekend. Using his compression pumps once a day 04/14/17 arrives today with continued improvement. Only one area on the posterior left calf that is not fully epithelialized. He has intense bilateral venous inflammation associated with his chronic venous insufficiency disease and secondary lymphedema. We have been using silver alginate to the left posterior calf wound In passing he tells [place] today that the right leg but we have not seen in quite some time has an open area on it but he doesn't want [place] to look at this today states he will show this to [place] next week. 04/21/17; there is no open area on his left leg although he still reports some weeping edema. He showed [place] his right leg today which is the first time we've seen this leg in a long time. He has a large area of open wound on the right leg anteriorly healthy granulation. Quite a bit of swelling in the right leg and some degree of venous inflammation. He told us about the right leg in passing last week but states that deterioration in the right leg really only happened over the weekend 04/28/17; there is no open area on the left leg although  there is an irritated part on the posterior which is like a wrap injury. The wound on the right leg which was new from last week at least to [place] is a lot better. 05/05/17; still no open area on the left leg. Patient is using his new compression stocking which seems to be doing a good job of controlling the edema. He states he is using his compression pumps once per day. The right leg still has an open wound although it is better in terms of surface area. Required debridement. A lot of pain in the posterior right Achilles marked tenderness. Usually this type of presentation this patient gives concern for an active cellulitis 05/12/17; patient arrives today with his major wound from last week on the right lateral leg somewhat better. Still requiring debridement. He was using his compression stocking on the left leg however that is reopened with superficial wounds anteriorly he did not have an open wound on this leg previously. He is still using his juxta light's once daily at night. He cannot find the time to do this in the morning as he has to be at work by 7 AM 05/19/17; right lateral leg wound looks improved. No debridement required. The concerning area is on the left posterior leg which appears to almost have a subcutaneous hemorrhagic component to it. We've been using silver alginate to all the wounds 05/26/17; the right lateral leg wound continues to look improved. However the area on the left posterior calf is a tightly adherent surface. [place] using silver alginate. Because of the weeping edema in his legs there is  very little good alternatives. 06/02/17; the patient left here last week looking quite good. Major wound on the left posterior calf and a small one on the right lateral calf. Both of these look satisfactory. He tells me that by Wednesday he had noted increased pain in the left leg and drainage. He called on Thursday and Friday to get an appointment here but we were blocked. He did not go to  urgent care or his primary physician. He thinks he had a fever on Thursday but did not actually take his temperature. He has not been using his compression pumps on the left leg because of pain. I advised him to go to the emergency room today for IV antibiotics for stents of left leg cellulitis but he has refused I have asked him to take 2 days off work to keep his leg elevated and he has refused this as well. In view of this I'm going to call him and Augmentin and doxycycline. 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 However after a detailed discussion regarding the risk benefits and alternatives he refuses to get admitted to the hospital. With no other recourse I will continue him on oral antibiotics as before and hopefully he'll have his infectious disease consultation this week. 06/16/2017 -- the patient was seen today by the nurse practitioner at infectious disease Ms. Dixon. 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 He has also been advise Lotrimin cream locally. she also had wise IV antibiotics for his condition if it flares up 06/23/17; patient arrives today with drainage bilaterally although the remaining wound on the left posterior calf after cleaning up today "highlighter yellow drainage" did not look too bad. Unfortunately he has had breakdown on the right anterior leg [previously this leg had not been open and he is using a black stocking] he went to see infectious disease and is been put on clindamycin 150 mg daily, I did not verify the dose although I'm not familiar  with using clindamycin in this dosing range, perhaps for prophylaxisoo 06/27/17; I brought this patient back today to follow-up on the wound deterioration on the right lower leg together with surrounding cellulitis. I started him on doxycycline 4 days ago. This area looks better however he comes in today with intense cellulitis on the medial part of his left thigh. This is not have a wound in this area. Extremely tender. We've been using silver alginate to the wounds on the right lower leg left lower leg with bilateral 4 layer compression he is using his external compression pumps once a day 07/04/17; patient's left medial thigh cellulitis looks better. He has not been using his compression pumps as his insert said it was contraindicated with cellulitis. His right leg continues to make improvements all the wounds are still open. We only have one remaining wound on the left posterior calf. Using silver alginate to all open areas. He is on doxycycline which I started a week ago and should be finishing I gave him Augmentin after Thursday's visit for the severe cellulitis on the left  medial thigh which fortunately looks better 07/14/17; the patient's left medial thigh cellulitis has resolved. The cellulitis in his right lower calf on the right also looks better. All of his wounds are stable to improved we've been using silver alginate he has completed the antibiotics I have given him. He has clindamycin 150 mg once a day prescribed by infectious disease for prophylaxis, I've advised him to start this now. We have been using bilateral Unna boots over silver alginate to the wound areas 07/21/17; the patient is been to see infectious disease who noted his recurrent problems with cellulitis. He was not able to tolerate prophylactic clindamycin therefore he is on amoxicillin 500 twice a day. He also had a second daily dose of Lasix added By Dr. Oneta Rack but he is not taking this. Nor is he being completely  compliant with his compression pumps a especially not this week. He has 2 remaining wounds one on the right posterior lateral lower leg and one on the left posterior medial lower leg. 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 The patient has Unna boots bilaterally. 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 He has bilateral Unna boots for compression and silver alginate to his wounds. Arrives today with his legs looking as good as I have seen him in quite some time. Not surprisingly his wounds look better as well with improvement on the right lateral leg venous insufficiency wound and also the left medial leg. He is still using the compression pumps once a day 08/11/17; both legs appear to be doing better wounds on the right lateral and left medial legs look better. Skin on the right leg quite good. He is been using silver alginate as the primary dressing. I'm going to use Anasept gel calcium alginate and maintain all the secondary dressings 08/18/17; the patient continues to actually do quite well. The area on his right lateral leg is just about closed the left medial also looks better although it is still moist in this area. His edema is well controlled we have been using Anasept gel with calcium alginate and the usual secondary dressings, 4 layer compression and once daily use of his compression pumps "always been able to manage 09/01/17; the patient continues to do reasonably well in spite of his trip to [place]. The area on the right lateral leg is epithelialized. Left is much better but still open. He has more edema and more chronic erythema on the left leg [venous inflammation] 09/08/17; he arrives today with no open wound on the right lateral leg and decently controlled  edema. Unfortunately his left leg is not nearly as in his good situation as last week.he apparently had increasing edema starting on Saturday. He edema soaked through into his foot so used a plastic bag to walk around his home. The area on the medial right leg which was his open area is about the same however he has lost surface epithelium on the left lateral which is new and he has significant pain in the Achilles area of the left foot. He is already on amoxicillin chronically for prophylaxis of cellulitis in the left leg 09/15/17; he is completed a week of doxycycline and the cellulitis in the left posterior leg and Achilles area is as usual improved. He still has  a lot of edema and fluid soaking through his dressings. There is no open wound on the right leg. He saw infectious disease NP today 09/22/17;As usual 1 we transition him from our compression wraps to his stockings things did not go well. He has several small open areas on the right leg. He states this was caused by the compression wrap on his skin although he did not wear this with the stockings over them. He has several superficial areas on the left leg medially laterally posteriorly. He does not have any evidence of active cellulitis especially involving the left Achilles The patient is traveling from [name] Saturday going to [name]. He states he isn't attempting to get an appointment with a heel objects wound [name] there to change his dressings. I am not completely certain whether this will work 10/06/17; the patient came in on Friday for a nurse visit and the nurse reported that his legs actually look quite good. He arrives in clinic today for his regular follow-up visit. He has a new wound on his left third toe over the PIP probably caused by friction with his footwear. He has small areas on the left leg and a very superficial but epithelialized area on the right anterior lateral lower leg. Other than that his legs look as  good as I've seen him in quite some time. We have been using silver alginate Review of systems; no chest pain no shortness of breath other than this a 10 point review of systems negative 10/20/17; seen by Dr. Meyer Russel last week. He had taken some antibiotics [doxycycline] that he had left over. Dr. Meyer Russel thought he had candida infection and declined to give him further antibiotics. He has a small wound remaining on the right lateral leg several areas on the left leg including a larger area on the left posterior several left medial and anterior and a small wound on the left lateral. The area on the left dorsal third toe looks a lot better. ROS; Gen.; no fever, respiratory no cough no sputum Cardiac no chest pain other than this 10 point review of system is negative 10/30/17; patient arrives today having fallen in the bathtub 3 days ago. It took him a while to get up. He has pain and maceration in the wounds on his left leg which have deteriorated. He has not been using his pumps he also has some maceration on the right lateral leg. 11/03/17; patient continues to have weeping edema especially in the left leg. This saturates his dressings which were just put on on 12/27. As usual the doxycycline seems to take care of the cellulitis on his lower leg. He is not complaining of fever, chills, or other systemic symptoms. He states his leg feels a lot better on the doxycycline I gave him empirically. He also apparently gets injections at his primary doctor's officeo Rocephin for cellulitis prophylaxis. I didn't ask him about his compression pump compliance today I think that's probably marginal. Arrives in the clinic with all of his dressings primary and secondary macerated full of fluid and he has bilateral edema 11/10/17; the patient's right leg looks some better although there is still a cluster of wounds on the right lateral. The left leg is inflamed with almost circumferential skin loss medially to  laterally although we are still maintaining anteriorly. He does not have overt cellulitis there is a lot of drainage. He is not using compression pumps. We have been using silver alginate to the wound areas, there are  not a lot of options here 11/17/17; the patient's right leg continues to be stable although there is still open wounds, better than last week. The inflammation in the left leg is better. Still loss of surface layer epithelium especially posteriorly. There is no overt cellulitis in the amount of edema and his left leg is really quite good, tells me he is using his compression pumps once a day. 11/24/17; patient's right leg has a small superficial wound laterally this continues to improve. The inflammation in the left leg is still improving however we have continuous surface layer epithelial loss posteriorly. There is no overt cellulitis in the amount of edema in both legs is really quite good. He states he is using his compression pumps on the left leg once a day for 5 out of 7 days 12/01/17; very small superficial areas on the right lateral leg continue to improve. Edema control in both legs is better today. He has continued loss of surface epithelialization and left posterior calf although I think this is better. We have been using silver alginate with large number of absorptive secondary dressings 4 layer on the left Unna boot on the right at his request. He tells me he is using his compression pumps once a day 12/08/17; he has no open area on the right leg is edema control is good here. ooOn the left leg however he has marked erythema and tenderness breakdown of skin. He has what appears to be a wrap injury just distal to the popliteal fossa. This is the pattern of his recurrent cellulitis area and he apparently received penicillin at his primary physician's office really worked in my view but usually response to doxycycline given it to him several times in the past 12/15/17; the patient  had already deteriorated last Friday when he came in for his nurse check. There was swelling erythema and breakdown in the right leg. He has much worse skin breakdown in the left leg as well multiple open areas medially and posteriorly as well as laterally. He tells me he has been using his compression pumps but tells me he feels that the drainage out of his leg is worse when he uses a compression pumps. T be fair to him he is been saying this o for a while however I don't know that I have really been listening to this. I wonder if the compression pumps are working properly 12/22/17;. Once again he arrives with severe erythema, weeping edema from the left greater than right leg. Noncompliance with compression pumps. New this visit he is complaining of pain on the lateral aspect of the right leg and the medial aspect of his right thigh. He apparently saw his cardiologist Dr. [name] who was ordered an echocardiogram area and I think this is a step in the right direction 12/25/17; started his doxycycline Monday night. There is still intense erythema of the right leg especially in the anterior thigh although there is less tenderness. The erythema around the wound on the right lateral calf also is less tender. He still complaining of pain in the left heel. His wounds are about the same right lateral left medial left lateral. Superficial but certainly not close to closure. He denies being systemically unwell no fever chills no abdominal pain no diarrhea 12/29/17; back in follow-up of his extensive right calf and right thigh cellulitis. I added amoxicillin to cover possible doxycycline resistant strep. This seems to of done the trick he is in much less pain there is much  less erythema and swelling. He has his echocardiogram at 11:00 this morning. X-ray of the left heel was also negative. 01/05/18; the patient arrived with his edema under much better control. Now that he is retired he is able to use his compression  pumps daily and sometimes twice a day per the patient. He has a wound on the right leg the lateral wound looks better. Area on the left leg also looks a lot better. He has no evidence of cellulitis in his bilateral thighs I had a quick peak at his echocardiogram. He is in normal ejection fraction and normal left ventricular function. He has moderate pulmonary hypertension moderately reduced right ventricular function. One would have to wonder about chronic sleep apnea although he says he doesn't snore. He'll review the echocardiogram with his cardiologist. 01/12/18; the patient arrives with the edema in both legs under exemplary control. He is using his compression pumps daily and sometimes twice daily. His wound on the right lateral leg is just about closed. He still has some weeping areas on the posterior left calf and lateral left calf although everything is just about closed here as well. I have spoken with Aldean Baker who is the patient's nurse practitioner and infectious disease. She was concerned that the patient had not understood that the parenteral penicillin injections he was receiving for cellulitis prophylaxis was actually benefiting him. I don't think the patient actually saw that I would tend to agree we were certainly dealing with less infections although he had a serious one last month. 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 He is healed. He'll be placed in juxtalite compression wraps and increase his lymphedema pumps to twice daily. We will follow up again next week to ensure there are no issues with the new regiment. 01/20/18-he is here for evaluation of bilateral lower extremity weeping edema. Yesterday he was placed in compression wrap to the right lower extremity and compression stocking to left lower shrubbery. He states he uses lymphedema pumps last night and again this morning and noted a blister to the left lower extremity. On exam  he was noted to have drainage to the right lower extremity. He will be placed in Unna boots bilaterally and follow-up next week 01/26/18; patient was actually discharged a week ago to his own juxta light stockings only to return the next day with bilateral lower extremity weeping edema.he was placed in bilateral Unna boots. He arrives today with pain in the back of his left leg. There is no open area on the right leg however there is a linear/wrap injury on the left leg and weeping edema on the left leg posteriorly. I spoke with infectious disease about 10 days ago. They were disappointed that the patient elected to discontinue prophylactic intramuscular penicillin shots as they felt it was particularly beneficial in reducing the frequency of his cellulitis. I discussed this with the patient today. He does not share this view. He'll definitely need antibiotics today. Finally he is traveling to [place] and trauma leaving this Saturday and returning a week later and he does not travel with his pumps. He is going by car 01/30/18; patient was seen 4 days ago and brought back in today for review of cellulitis in the left leg posteriorly. I put him on amoxicillin this really hasn't helped as much as I might like. He is also worried because he is traveling to [facility] trauma by car. Finally we will be rewrapping him. There is no  open area on the right leg over his left leg has multiple weeping areas as usual 02/09/18; The same wrap on for 10 days. He did not pick up the last doxycycline I prescribed for him. He apparently took 4 days worth he already had. There is nothing open on his right leg and the edema control is really quite good. He's had damage in the left leg medially and laterally especially probably related to the prolonged use of Unna boots 02/12/18; the patient arrived in clinic today for a nurse visit/wrap change. He complained of a lot of pain in the left posterior calf. He is taking doxycycline  that I previously prescribed for him. Unfortunately even though he used his stockings and apparently used to compression pumps twice a day he has weeping edema coming out of the lateral part of his right leg. This is coming from the lower anterior lateral skin area. 02/16/18; the patient has finished his doxycycline and will finish the amoxicillin 2 days. The area of cellulitis in the left calf posteriorly has resolved. He is no longer having any pain. He tells me he is using his compression pumps at least once a day sometimes twice. 02/23/18; the patient finished his doxycycline and Amoxil last week. On Friday he noticed a small erythematous circle about the size of a quarter on the left lower leg just above his ankle. This rapidly expanded and he now has erythema on the lateral and posterior part of the thigh. This is bright red. Also has an area on the dorsal foot just above his toes and a tender area just below the left popliteal fossa. He came off his prophylactic penicillin injections at his own insistence one or 2 months ago. This is obviously deteriorated since then 03/02/18; patient is on doxycycline and Amoxil. Culture I did last week of the weeping area on the back of his left calf grew group B strep. I have therefore renewed the amoxicillin 500 3 times a day for a further week. He has not been systemically unwell. Still complaining of an area of discomfort right under his left popliteal fossa. There is no open wound on the right leg. He tells me that he is using his pumps twice a day on most days 03/09/18; patient arrives in clinic today completing his amoxicillin today. The cellulitis on his left leg is better. Furthermore he tells me that he had intramuscular penicillin shots that his primary care office today. However he also states that the wrap on his right leg fell down shortly after leaving clinic last week. He developed a large blister that was present when he came in for a nurse visit  later in the week and then he developed intense discomfort around this area.He tells me he is using his compression pumps 03/16/18; the patient has completed his doxycycline. The infectious part of this/cellulitis in the left heel area left popliteal area is a lot better. He has 2 open areas on the right calf. Still areas on the left calf but this is a lot better as well. 03/24/18; the patient arrives complaining of pain in the left popliteal area again. He thinks some of this is wrap injury. He has no open area on the right leg and really no open area on the left calf either except for the popliteal area. He claims to be compliant with the compression pumps 03/31/18; I gave him doxycycline last week because of cellulitis in the left popliteal area. This is a lot better although  the surface epithelium is denuded off and response to this. He arrives today with uncontrolled edema in the right calf area as well as a fingernail injury in the right lateral calf. There is only a few open areas on the left 04/06/18; I gave him amoxicillin doxycycline over the last 2 weeks that the amoxicillin should be completing currently. He is not complaining of any pain or systemic symptoms. The only open areas see has is on the right lateral lower leg paradoxically I cannot see anything on the left lower leg. He tells me he is using his compression pumps twice a day on most days. Silver alginate to the wounds that are open under 4 layer compression 04/13/18; he completed antibiotics and has no new complaints. Using his compression pumps. Silver alginate that anything that's opened 04/20/18; he is using his compression pumps religiously. Silver alginate 4 layer compression anything that's opened. He comes in today with no open wounds on the left leg but 3 on the right including a new one posteriorly. He has 2 on the right lateral and one on the right posterior. He likes Unna boots on the right leg for reasons that aren't really  clear we had the usual 4 layer compression on the left. It may be necessary to move to the 4 layer compression on the right however for now I left them in the Unna boots 04/27/18; he is using his compression pumps at least once a day. He has still the wounds on the right lateral calf. The area right posteriorly has closed. He does not have an open wound on the left under 4 layer compression however on the dorsal left foot just proximal to the toes and the left third toe 2 small open areas were identified 05/11/18; he has not uses compression pumps. The areas on the right lateral calf have coalesced into one large wound necrotic surface. On the left side he has one small wound anteriorly however the edema is now weeping out of a large part of his left leg. He says he wasn't using his pumps because of the weeping fluid. I explained to him that this is the time he needs to pump more 05/18/18; patient states he is using his compression pumps twice a day. The area on the right lateral large wound albeit superficial. On the left side he has innumerable number of small new wounds on the left calf particularly laterally but several anteriorly and medially. All these appear to have healthy granulated base these look like the remnants of blisters however they occurred under compression. The patient arrives in clinic today with his legs somewhat better. There is certainly less edema, less multiple open areas on the left calf and the right anterior leg looks somewhat better as well superficial and a little smaller. However he relates pain and erythema over the last 3-4 days in the thigh and I looked at this today. He has not been systemically unwell no fever no chills no change in blood sugar values 05/25/18; comes in today in a better state. The severe cellulitis on his left leg seems better with the Keflex. Not as tender. He has not been systemically unwell ooHard to find an open wound on the left lower leg using  his compression pumps twice a day ooThe confluent wounds on his right lateral calf somewhat better looking. These will ultimately need debridement I didn't do this today. 06/01/18; the severe cellulitis on the left anterior thigh has resolved and he is completed  his Keflex. ooThere is no open wound on the left leg however there is a superficial excoriation at the base of the third toe dorsally. Skin on the bottom of his left foot is macerated looking. ooThe left the wounds on the lateral right leg actually looks some better although he did require debridement of the top half of this wound area with an open curet 06/09/18 on evaluation today patient appears to be doing poorly in regard to his right lower extremity in particular this appears to likely be infected he has very thick purulent discharge along with a bright green tent to the discharge. This makes me concerned about the possibility of pseudomonas. He's also having increased discomfort at this point on evaluation. Fortunately there does not appear to be any evidence of infection spreading to the other location at this time. 06/16/18 on evaluation today patient appears to actually be doing fairly well. His ulcer has actually diminished in size quite significantly at this point which is good news. Nonetheless he still does have some evidence of infection he did see infectious disease this morning before coming here for his appointment. I did review the results of their evaluation and their note today. They did actually have him discontinue the Cipro and initiate treatment with linezolid at this time. He is doing this for the next seven days and they recommended a follow-up in four months with them. He is the keep a log of the need for intermittent antibiotic therapy between now and when he falls back up with infectious disease. This will help them gaze what exactly they need to do to try and help them out. 06/23/18; the patient arrives today with  no open wounds on the left leg and left third toe healed. He is been using his compression pumps twice a day. On the right lateral leg he still has a sizable wound but this is a lot better than last time I saw this. 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 Has been using silver alginate under 4 layer compression 06/30/18; the only open wound he has is on the right lateral leg and this looks healthy. No debridement is required. We have been using silver alginate. He does not have an open wound on the left leg. There is apparently some drainage from the dorsal proximal third toe on the left although I see no open wound here. 07/03/18 on evaluation today patient was actually here just for a nurse visit rapid change. However when he was here on Wednesday for his rat change due to having been healed on the left and then developing blisters we initiated the wrap again knowing that he would be back today for [place] to reevaluate and see were at. Unfortunately he has developed some cellulitis into the proximal portion of his right lower extremity even into the region of his thigh. 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 He was placed on one as what at that point. Nonetheless he is done with that and has been tolerating it well otherwise. Doxycycline which in the past really did not seem to be effective for him. Nonetheless I think the best option may be for [place] to definitely reinitiate the antibiotics for a longer period of time. 07/07/18; since I last saw this patient a week ago he has had a difficult time. At that point  he did not have an open wound on his left leg. We transitioned him into juxta light stockings. He was apparently in the clinic the next day with blisters on the left lateral and left medial lower calf. He also had weeping edema fluid. He was put back  into a compression wrap. He was also in the clinic on Friday with intense erythema in his right thigh. Per the patient he was started on Bactrim however that didn't work at all in terms of relieving his pain and swelling. He has taken 3 doxycycline that he had left over from last time and that seems to of helped. He has blistering on the right thigh as well. 07/14/18; the erythema on his right thigh has gotten better with doxycycline that he is finishing. 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 Presumably this cellulitis in the thigh was not related to that although I think this is a bit concerning going forward. He still has an area on the right lateral calf the blister on the right medial calf just below the knee that was discussed above. On the left 2 small open areas left medial and left lateral. Edema control is adequate. He is using his compression pumps twice a day 07/20/18; continued improvement in the condition of both legs especially the edema in his bilateral thighs. He tells me he is been losing weight through a combination of diet and exercise. He is using his compression pumps twice a day. So overall she made to the remaining wounds 07/27/2018; continued improvement in condition of both legs. His edema is well controlled. The area on the right lateral leg is just about closed he had one blisters show up on the medial left upper calf. We have him in 4 layer compression. He is going on a 10-day trip to [place], [place] and [place]. He will be driving. He wants to wear Unna boots because of the lessening amount of constriction. He will not use compression pumps while he is away 08/05/18 on evaluation today patient actually appears to be doing decently well all things considered in regard to his bilateral lower extremities. The worst ulcer is actually only posterior aspect of his left lower extremity with a four  layer compression wrap cut into his leg a couple weeks back. He did have a trip and actually had [employer] for the trip that he is worn since he was last here. Nonetheless he feels like the [employer] actually do better for him his swelling is up a little bit but he also with his trip was not taking his Lasix on a regular set schedule like he was supposed to be. He states that obviously the reason being that he cannot drive and keep going without having to urinate too frequently which makes it difficult. He did not have his pumps with him while he was away either which I think also maybe playing a role here too. 08/13/2018; the patient only has a small open wound on the right lateral calf which is a big improvement in the last month or 2. He also has the area posteriorly just below the posterior fossa on the left which I think was a wrap injury from several weeks ago. He has no current evidence of cellulitis. He tells me he is back into his compression pumps twice a day. He also tells me that while he was at  the laundromat somebody stole a section of his extremitease stockings 08/20/2018; back in the clinic with a much improved state. He only has small areas on the right lateral mid calf which is just about healed. This was is more substantial area for quite a prolonged period of time. He has a small open area on the left anterior tibia. The area on the posterior calf just below the popliteal fossa is closed today. He is using his compression pumps twice a day 08/28/2018; patient has no open wound on the right leg. He has a smattering of open areas on the calf with some weeping lymphedema. More problematically than that it looks as though his wraps of slipped down in his usual he has very angry upper area of edema just below the right medial knee and on the right lateral calf. He has no open area on his feet. The patient is traveling to [name] next week. I will send him in an  antibiotic. We will continue to wrap the right leg. We ordered extremitease stockings for him last week and I plan to transition the right leg to a stocking when he gets home which will be in 10 days time. As usual he is very reluctant to take his pumps with him when he travels 09/07/2018; patient returns from [facility] He shows me a picture of his left leg in the mid part of his trip last week with intense fire engine erythema. The picture look bad enough I would have considered sending him to the hospital. Instead he went to the [name]. They did not prescribe him antibiotics but he did take some doxycycline he had leftover from a previous visit. I had given him trimethoprim sulfamethoxazole before he left this did not work according to the patient. This is resulted in some improvement fortunately. He comes back with a large wound on the left posterior calf. Smaller area on the left anterior tibia. Denuded blisters on the dorsal left foot over his toes. Does not have much in the way of wounds on the right leg although he does have a very tender area on the right posterior area just below the popliteal fossa also suggestive of infection. He promises me he is back on his pumps twice a day 09/15/2018; the intense cellulitis in his left lower calf is a lot better. The wound area on the posterior left calf is also so better. However he has reasonably extensive wounds on the dorsal aspect of his second and third toes and the proximal foot just at the base of the toes. There is nothing open on the right leg 09/22/2018; the patient has excellent edema control in his legs bilaterally. He is using his external compression pumps twice a day. He has no open area on the right leg and only the areas in the left foot dorsally second and third toe area on the left side. He does not have any signs of active cellulitis. 10/06/2018; the patient has good edema control bilaterally.  He has no open wound on the right leg. There is a blister in the posterior aspect of his left calf that we had to deal with today. He is using his compression pumps twice a day. There is no signs of active cellulitis. We have been using silver alginate to the wound areas. He still has vulnerable areas on the base of his left first second toes dorsally He has a his extremities stockings and we are going  to transition him today into the stocking on the right leg. He is cautioned that he will need to continue to use the compression pumps twice a day. If he notices uncontrolled edema in the right leg he may need to go to 3 times a day. 10/13/2018; the patient came in for a nurse check on Friday he has a large flaccid blister on the right medial calf just below the knee. We unroofed this. He has this and a new area underneath the posterior mid calf which was undoubtedly a blister as well. He also has several small areas on the right which is the area we put his extremities stocking on. 10/19/2018; the patient went to see infectious disease this morning I am not sure if that was a routine follow-up in any case the doxycycline I had given him was discontinued and started on linezolid. He has not started this. It is easy to look at his left calf and the inflammation and think this is cellulitis however he is very tender in the tissue just below the popliteal fossa and I have no doubt that there is infection going on here. He states the problem he is having is that with the compression pumps the edema goes down and then starts walking the wrap falls down. We will see if we can adhere this. He has 1 or 2 minuscule open areas on the right still areas that are weeping on the posterior left calf, the base of his left second and third toes 10/26/18; back today in clinic with quite of skin breakdown in his left anterior leg. This may have been infection the area below the popliteal fossa seems a lot better however  tremendous epithelial loss on the left anterior mid tibia area over quite inexpensive tissue. He has 2 blisters on the right side but no other open wound here. 10/29/2018; came in urgently to see [place] today and we worked him in for review. He states that the 4 layer compression on the right leg caused pain he had to cut it down to roughly his mid calf this caused swelling above the wrap and he has blisters and skin breakdown today. As a result of the pain he has not been using his pumps. Both legs are a lot more edematous and there is a lot of weeping fluid. 11/02/18; arrives in clinic with continued difficulties in the right leg> left. Leg is swollen and painful. multiple skin blisters and new open areas especially laterally. He has not been using his pumps on the right leg. 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 He is not systemically unwell. 11/09/2018; the patient claims he is being compliant with his pumps. He is finished the doxycycline I gave him last week. Culture I did of the wound on the right lateral leg showed a few very resistant methicillin staph aureus. This was resistant to doxycycline. Nevertheless he states the pain in the leg is a lot better which makes me wonder if the cultured organism was not really what was causing the problem nevertheless this is a very dangerous organism to be culturing out of any wound. His right leg is still a lot larger than the left. He is using an Radio broadcast assistant on this area, he blames a 4-layer compression for causing the original skin breakdown which I doubt is true however I cannot talk him out of it. We have been using silver alginate to all of these areas which were  initially blisters 11/16/2018; patient is being compliant with his external compression pumps at twice a day. Miraculously he arrives in clinic today with absolutely no open wounds. He has better edema control on the left where he has been using 4  layer compression versus wound of wounds on the right and I pointed this out to him. There is no inflammation in the skin in his lower legs which is also somewhat unusual for him. There is no open wounds on the dorsal left foot. He has extremitease stockings at home and I have asked him to bring these in next week. 11/25/18 patient's lower extremity on examination today on the left appears for the most part to be wound free. He does have an open wound on the lateral aspect of the right lower extremity but this is minimal compared to what I've seen in past. He does request that we go ahead and wrap the left leg as well even though there's nothing open just so hopefully it will not reopen in short order. 1/28; patient has superficial open wounds on the right lateral calf left anterior calf and left posterior calf. His edema control is adequate. 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 We have been using silver alginate as the primary dressing. He claims compliance with his compression pumps 2/4; patient has superficial open wounds on numerous areas of his left calf and again one on the left dorsal foot. The areas on the right lateral calf have healed. The cellulitis that I gave him doxycycline for last week is also resolved this was mostly on the left anterior calf just below the tibial tuberosity. His edema looks fairly well-controlled. He tells me he went to see his primary doctor today and had blood work ordered 2/11; once again he has several open areas on the left calf left tibial area. Most of these are small and appear to have healthy granulation. He does not have anything open on the right. The edema and control in his thighs is pretty good which is usually a good indication he has been using his pumps as requested. 2/18; he continues to have several small areas on the left calf and left tibial area. Most of  these are small healthy granulation. We put him in his stocking on the right leg last week and he arrives with a superficial open area over the right upper tibia and a fairly large area on the right lateral tibia in similar condition. His edema control actually does not look too bad, he claims to be using his compression pumps twice a day 2/25. Continued small areas on the left calf and left tibial area. New areas especially on the right are identified just below the tibial tuberosity and on the right upper tibia itself. There are also areas of weeping edema fluid even without an obvious wound. He does not have a considerable degree of lymphedema but clearly there is more edema here than his skin can handle. He states he is using the pumps twice a day. We have an Unna boot on the right and 4 layer compression on the left. 3/3; he continues to have an area on the right lateral calf and right posterior calf just below the popliteal fossa. There is a fair amount of tenderness around the wound on the popliteal fossa but I did not see any evidence of cellulitis, could just be that the wrap came down  and rubbed in this area. ooHe does not have an open area on the left leg however there is an area on the left dorsal foot at the base of the third toe ooWe have been using silver alginate to all wound areas 3/10; he did not have an open area on his left leg last time he was here a week ago. T oday he arrives with a horizontal wound just below the tibial tuberosity and an area on the left lateral calf. He has intense erythema and tenderness in this area. The area is on the right lateral calf and right posterior calf better than last week. We have been using silver alginate as usual 3/18 - Patient returns with 3 small open areas on left calf, and 1 small open area on right calf, the skin looks ok with no significant erythema, he continues the UNA boot on right and 4 layer compression on left. The right lateral  calf wound is closed , the right posterior is small area. we will continue silver alginate to the areas. 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 He is been tolerating the dressing changes without complication. Fortunately she has made excellent improvement in regard to the overall status of his wounds. Unfortunately every time we cease wrapping him he ends up reopening in causing more significant issues at that point. Again I'm unsure of the best direction to take although I think the lymphedema clinic may be appropriate for him. 02/03/19 on evaluation today patient appears to be doing well in regard to the wounds that we saw him for last week unfortunately he has a new area on the proximal portion of his right medial/posterior lower extremity where the wrap somewhat slowed down and caused swelling and a blister to rub and open. Unfortunately this is the only opening that he has on either leg at this point. 02/17/19 on evaluation today patient's bilateral lower extremities appear to be doing well. He still completely healed in regard to the left lower extremity. In regard to the right lower extremity the area where the wrap and slid down and caused the blister still seems to be slightly open although this is dramatically better than during the last evaluation two weeks ago. I'm very pleased with the way this stands overall. 03/03/19 on evaluation today patient appears to be doing well in regard to his right lower extremity in general although he did have a new blister open this does not appear to be showing any evidence of active infection at this time. Fortunately there's No fevers, chills, nausea, or vomiting noted at this time. Overall I feel like he is making good progress it does feel like that the right  leg will we perform the [employer] seems to do with a bit better than three layer wrap on the left which slid down on him. We may switch to doing bilateral in the book wraps. 5/4; I have not seen Mr. Reddy in quite some time. According to our case manager he did not have an open wound on his left leg last week. He had 1 remaining wound on the right posterior medial calf. He arrives today with multiple openings on the left leg probably were blisters and/or wrap injuries from Unna boots. I do not think the Unna boot's will provide adequate compression  on the left. I am also not clear about the frequency he is using the compression pumps. 03/17/19 on evaluation today patient appears to be doing excellent in regard to his lower extremities compared to last week's evaluation apparently. He had gotten significantly worse last week which is unfortunate. The [employer] wrap on the left did not seem to do very well for him at all and in fact it didn't control his swelling significantly enough he had an additional outbreak. Subsequently we go back to the four layer compression wrap on the left. This is good news. At least in that he is doing better and the wound seem to be killing him. He still has not heard anything from the lymphedema clinic. 03/24/19 on evaluation today patient actually appears to be doing much better in regard to his bilateral lower Trinity as compared to last week when I saw him. Fortunately there's no signs of active infection at this time. He has been tolerating the dressing changes without complication. Overall I'm extremely pleased with the progress and appearance in general. 04/07/19 on evaluation today patient appears to be doing well in regard to his bilateral lower extremities. His swelling is significantly down from where it was previous. With that being said he does have a couple blisters still open at this point but fortunately nothing that seems to be too severe and again the  majority of the larger openings has healed at this time. 04/14/19 on evaluation today patient actually appears to be doing quite well in regard to his bilateral lower extremities in fact I'm not even sure there's anything significantly open at this time at any site. Nonetheless he did have some trouble with these wraps where they are somewhat irritating him secondary to the fact that he has noted that the graph wasn't too close down to the end of this foot in a little bit short as well up to his knee. Otherwise things seem to be doing quite well. 04/21/19 upon evaluation today patient's wound bed actually showed evidence of being completely healed in regard to both lower extremities which is excellent news. There does not appear to be any signs of active infection which is also good news. I'm very pleased in this regard. No fevers, chills, nausea, or vomiting noted at this time. 04/28/19 on evaluation today patient appears to be doing a little bit worse in regard to both lower extremities on the left mainly due to the fact that when he went infection disease the wrap was not wrapped quite high enough he developed a blister above this. On the right he is a small open area of nothing too significant but again this is continuing to give him some trouble he has been were in the Velcro compression that he has at home. 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 He's been tolerating the dressing changes without complication. Fortunately there's no signs of active infection at this time. No fevers, chills, nausea, or vomiting noted at this time. We have been trying to get an appointment with her lymphedema clinic in [facility] but unfortunately nobody can get them on phone with not been able to even fax information over the patient likewise is not been able to get in touch with them. Overall I'm not sure exactly what's going on here  with to reach out again today. 05/12/19 on evaluation today patient actually appears to be doing about the same in regard to  his bilateral lower Trinity ulcers. Still having a lot of drainage unfortunately. He tells me especially in the left but even on the right. There's no signs of active infection which is good news we've been using so ratcheted up to this point. 05/19/19 on evaluation today patient actually appears to be doing quite well with regard to his left lower extremity which is great news. Fortunately in regard to the right lower extremity has an issues with his wrap and he subsequently did remove this from what I'm understanding. Nonetheless long story short is what he had rewrapped once he removed it subsequently had maggots underneath this wrap whenever he came in for evaluation today. With that being said they were obviously completely cleaned away by the nursing staff. The visit today which is excellent news. However he does appear to potentially have some infection around the right ankle region where the maggots were located as well. He will likely require anabiotic therapy today. 05/26/19 on evaluation today patient actually appears to be doing much better in regard to his bilateral lower extremities. I feel like the infection is under much better control. With that being said there were maggots noted when the wrap was removed yet again today. Again this could have potentially been left over from previous although at this time there does not appear to be any signs of significant drainage there was obviously on the wrap some drainage as well this contracted gnats or otherwise. Either way I do not see anything that appears to be doing worse in my pinion and in fact I think his drainage has slowed down quite significantly likely mainly due to the fact to his infection being under better control. 06/02/2019 on evaluation today patient actually appears to be doing well with regard to his  bilateral lower extremities there is no signs of active infection at this time which is great news. With that being said he does have several open areas more so on the right than the left but nonetheless these are all significantly better than previously noted. 06/09/2019 on evaluation today patient actually appears to be doing well. His wrap stayed up and he did not cause any problems he had more drainage on the right compared to the left but overall I do not see any major issues at this time which is great news. 06/16/2019 on evaluation today patient appears to be doing excellent with regard to his lower extremities the only area that is open is a new blister that can have opened as of today on the medial ankle on the left. Other than this he really seems to be doing great I see no major issues at this point. 06/23/2019 on evaluation today patient appears to be doing quite well with regard to his bilateral lower extremities. In fact he actually appears to be almost completely healed there is a small area of weeping noted of the right lower extremity just above the ankle. Nonetheless fortunately there is no signs of active infection at this time which is good news. No fevers, chills, nausea, vomiting, or diarrhea. 8/24; the patient arrived for a nurse visit today but complained of very significant pain in the left leg and therefore I was asked to look at this. Noted that he did not have an open area on the left leg last week nevertheless this was wrapped. The patient states that he is not been able to put his compression pumps on the left leg because of the discomfort. He has not been  systemically unwell 06/30/2019 on evaluation today patient unfortunately despite being excellent last week is doing much worse with regard to his left lower extremity today. In fact he had to come in for a nurse on Monday where his left leg had to be rewrapped due to excessive weeping Dr. Leanord Hawking placed him on doxycycline at  that point. Fortunately there is no signs of active infection Systemically at this time which is good news. 07/07/2019 in regard to the patient's wounds today he actually seems to be doing well with his right lower extremity there really is nothing open or draining at this point this is great news. Unfortunately the left lower extremity is given him additional trouble at this time. There does not appear to be any signs of active infection nonetheless he does have a lot of edema and swelling noted at this point as well as blistering all of which has led to a much more poor appearing leg at this time compared to where it was 2 weeks ago when it was almost completely healed. Obviously this is a little discouraging for the patient. He is try to contact the lymphedema clinic in [place] he has not been able to get through to them. 07/14/2019 on evaluation today patient actually appears to be doing slightly better with regard to his left lower extremity ulcers. Overall I do feel like at least at the top of the wrap that we have been placing this area has healed quite nicely and looks much better. The remainder of the leg is showing signs of improvement. Unfortunately in the thigh area he still has an open region on the left and again on the right he has been utilizing just a Band-Aid on an area that also opened on the thigh. Again this is an area that were not able to wrap although we did do an Ace wrap to provide some compression that something that obviously is a little less effective than the compression wraps we have been using on the lower portion of the leg. He does have an appointment with the lymphedema clinic in [facility] on Friday. 07/21/2019 on evaluation today patient appears to be doing better with regard to his lower extremity ulcers. He has been tolerating the dressing changes without complication. Fortunately there is no signs of active infection at this time. No fevers, chills, nausea,  vomiting, or diarrhea. I did receive the paperwork from the physical therapist at the lymphedema clinic in [place]. Subsequently I signed off on that this morning and sent that back to him for further progression with the treatment plan. 07/28/2019 on evaluation today patient appears to be doing very well with regard to his right lower extremity where I do not see any open wounds at this point. Fortunately he is feeling great as far as that is concerned as well. In regard to the left lower extremity he has been having issues with still several areas of weeping and edema although the upper leg is doing better his lower leg still I think is going require the compression wrap at this time. No fevers, chills, nausea, vomiting, or diarrhea. 08/04/2019 on evaluation today patient unfortunately is having new wounds on the right lower extremity. Again we have been using Unna boot wrap on that side. We switched him to using his juxta lite wrap at home. With that being said he tells me he has been using it although his legs extremely swollen and to be honest really does not appear that he has been. I  cannot know that for sure however. Nonetheless he has multiple new wounds on the right lower extremity at this time. Obviously we will have to see about getting this rewrapped for him today. 08/11/2019 on evaluation today patient appears to be doing fairly well with regard to his wounds. He has been tolerating the dressing changes including the compression wraps without complication. He still has a lot of edema in his upper thigh regions bilaterally he is supposed to be seeing the lymphedema clinic on the 15th of this month once his wraps arrive for the upper part of his legs. 08/18/2019 on evaluation today patient appears to be doing well with regard to his bilateral lower extremities at this point. He has been tolerating the dressing changes without complication. Fortunately there is no signs of active  infection which is also good news. He does have a couple weeping areas on the first and second toe of the right foot he also has just a small area on the left foot upper leg and a small area on the left lower leg but overall he is doing quite well in my opinion. He is supposed to be getting his wraps shortly in fact tomorrow and then subsequently is seeing the lymphedema clinic next Wednesday on the 21st. Of note he is also leaving on the 25th to go on vacation for a week to the beach. For that reason and since there is some uncertainty about what there can be doing at lymphedema clinic next Wednesday I am get a make an appointment for next Friday here for [place] to see what we need to do for him prior to him leaving for vacation. 10/23; patient arrives in considerable pain predominantly in the upper posterior calf just distal to the popliteal fossa also in the wound anteriorly above the major wound. This is probably cellulitis and he has had this recurrently in the past. He has no open wound on the right side and he has had an Radio broadcast assistant in that area. Finally I note that he has an area on the left posterior calf which by enlarge is mostly epithelialized. This protrudes beyond the borders of the surrounding skin in the setting of dry scaly skin and lymphedema. The patient is leaving for [name] on Sunday. Per his longstanding pattern, he will not take his compression pumps with him predominantly out of fear that they will be stolen. He therefore asked that we put a Unna boot back on the right leg. He will also contact the wound care [name] in [name] to see if they can change his dressing in the mid week. 11/3; patient returned from his vacation to [name]. He was seen on 1 occasion at their wound care [name]. They did a 2 layer compression system as they did not have our 4-layer wrap. I am not completely certain what they put on the wounds. They did not change the Unna boot on the  right. The patient is also seeing a lymphedema specialist physical therapist in [place]. It appears that he has some compression sleeve for his thighs which indeed look quite a bit better than I am used to seeing. He pumps over these with his external compression pumps. 11/10; the patient has a new wound on the right medial thigh otherwise there is no open areas on the right. He has an area on the left leg posteriorly anteriorly and medially and an area over the left second toe. We have been using silver alginate. He thinks  the injury on his thigh is secondary to friction from the compression sleeve he has. 11/17; the patient has a new wound on the right medial thigh last week. He thinks this is because he did not have a underlying stocking for his thigh juxta lite apparatus. He now has this. The area is fairly large and somewhat angry but I do not think he has underlying cellulitis. ooHe has a intact blister on the right anterior tibial area. ooSmall wound on the right great toe dorsally ooSmall area on the medial left calf. 11/30; the patient does not have any open areas on his right leg and we did not take his juxta lite stocking off. However he states that on Friday his compression wrap fell down lodging around his upper mid calf area. As usual this creates a lot of problems for him. He called urgently today to be seen for a nurse visit however the nurse visit turned into a provider visit because of extreme erythema and pain in the left anterior tibia extending laterally and posteriorly. The area that is problematic is extensive 10/06/2019 upon evaluation today patient actually appears to be doing poorly in regard to his left lower extremity. He Dr. Leanord Hawking did place him on doxycycline this past Monday apparently due to the fact that he was doing much worse in regard to this left leg. Fortunately the doxycycline does seem to be helping. Unfortunately we are still having a very difficult  time getting his edema under any type of control in order to anticipate discharge at some point. The only way were really able to control his lymphedema really is with compression wraps and that has only even seemingly temporary. He has been seeing a lymphedema clinic they are trying to help in this regard but still this has been somewhat frustrating in general for the patient. 10/13/19 on evaluation today patient appears to be doing excellent with regard to his right lower extremity as far as the wounds are concerned. His swelling is still quite extensive unfortunately. He is still having a lot of drainage from the thigh areas bilaterally which is unfortunate. He's been going to lymphedema clinic but again he still really does not have this edema under control as far as his lower extremities are concern. With regard to his left lower extremity this seems to be improving and I do believe the doxycycline has been of benefit for him. He is about to complete the doxycycline. 10/20/2019 on evaluation today patient appears to be doing poorly in regard to his bilateral lower extremities. More in the right thigh he has a lot of irritation at this site unfortunately. In regard to the left lower extremity the wrap was not quite as high it appears and does seem to have caused him some trouble as well. Fortunately there is no evidence of systemic infection though he does have some blue-green drainage which has me concerned for the possibility of Pseudomonas. He tells me he is previously taking Cipro without complications and he really does not care for Levaquin however due to some of the side effects he has. He is not allergic to any medications specifically antibiotics that were aware of. 10/27/2019 on evaluation today patient actually does appear to be for the most part doing better when compared to last week's evaluation. With that being said he still has multiple open wounds over the bilateral lower  extremities. He actually forgot to start taking the Cipro and states that he still has the whole bottle. He  does have several new blisters on left lower extremity today I think I would recommend he go ahead and take the Cipro based on what I am seeing at this point. 12/30-Patient comes at 1 week visit, 4 layer compression wraps on the left and Unna boot on the right, primary dressing Xtrasorb and silver alginate. Patient is taking his Cipro and has a few more days left probably 5-6, and the legs are doing better. He states he is using his compressions devices which I believe he has 11/10/2019 on evaluation today patient actually appears to be much better than last time I saw him 2 weeks ago. His wounds are significantly improved and overall I am very pleased in this regard. Fortunately there is no signs of active infection at this time. He is just a couple of days away from completing Cipro. Overall his edema is much better he has been using his lymphedema pumps which I think is also helping at this point. 11/17/2019 on evaluation today patient appears to be doing excellent in regard to his wounds in general. His legs are swollen but not nearly as much as they have been in the past. Fortunately he is tolerating the compression wraps without complication. No fevers, chills, nausea, vomiting, or diarrhea. He does have some erythema however in the distal portion of his right lower extremity specifically around the forefoot and toes there is a little bit of warmth here as well. 11/24/2019 on evaluation today patient appears to be doing well with regard to his right lower extremity I really do not see any open wounds at this point. His left lower extremity does have several open areas and his right medial thigh also is open. Other than this however overall the patient seems to be making good progress and I am very pleased at this point. 12/01/2019 on evaluation today patient appears to be doing poorly at this  point in regard to his left lower extremity has several new blisters despite the fact that we have him in compression wraps. In fact he had a 4-layer compression wrap, his upper thigh wrapped from lymphedema clinic, and a juxta light over top of the 4 layer compression wrap the lymphedema clinic applied and despite all this he still develop blisters underneath. Obviously this does have me concerned about the fact that unfortunately despite what we are doing to try to get wounds healed he continues to have new areas arise I do not think he is ever good to be at the point where he can realistically just use wraps at home to keep things under control. Typically when we heal him it takes about 1-2 days before he is back in the clinic with severe breakdown and blistering of his lower extremities bilaterally. This is happened numerous times in the past. Unfortunately I think that we may need some help as far as overall fluid overload to kind of limit what we are seeing and get things under better control. 12/08/2019 on evaluation today patient presents for follow-up concerning his ongoing bilateral lower extremity edema. Unfortunately he is still having quite a bit of swelling the compression wraps are controlling this to some degree but he did see Dr. [name] his cardiologist I do have that available for review today as far as the appointment was concerned that was on 12/06/2019. Obviously that she has been 2 days ago. The patient states that he is only been taking the Lasix 80 mg 1 time a day he had told me previously he  was taking this twice a day. Nonetheless Dr. [name] recommended this be up to 80 mg 2 times a day for the patient as he did appear to be fluid overloaded. With that being said the patient states he did this yesterday and he was unable to go anywhere or do anything due to the fact that he was constantly having to urinate. Nonetheless I think that this is still good to be something that is important  for him as far as trying to get his edema under control at all things that he is going to be able to just expect his wounds to get under control and things to be better without going through at least a period of time where he is trying to stabilize his fluid management in general and I think increasing the Lasix is likely the first step here. It was also mentioned the possibility that the patient may require metolazone. With that being said he wanted to have the patient take Lasix twice a day first and then reevaluating 2 months to see where things stand. 12/15/2019 upon evaluation today patient appears to be doing regard to his legs although his toes are showing some signs of weeping especially on the left at this point to some degree on the right. There does not appear to be any signs of active infection and overall I do feel like the compression wraps are doing well for him but he has not been able to take the Lasix at home and the increased dose that Dr. [name] recommended. He tells me that just not go to be feasible for him. Nonetheless I think in this case he should probably send a message to Dr. [name] in order to discuss options from the standpoint of possible admission to get the fluid off or otherwise going forward. 12/22/2019 upon evaluation today patient appears to be doing fairly well with regard to his lower extremities at this point. In fact he would be doing excellent if it was not for the fact that his right anterior thigh apparently had an allergic reaction to adhesive tape that he used. The wound itself that we have been monitoring actually appears to be healed. There is a lot of irritation at this point. 12/29/2019 upon evaluation today patient appears to be doing well in regard to his lower extremities. His left medial thigh is open and somewhat draining today but this is the only region that is open the right has done much better with the treatment utilizing the steroid cream that I  prescribed for him last week. Overall I am pleased in that regard. Fortunately there is no signs of active infection at this time. No fevers, chills, nausea, vomiting, or diarrhea. 01/05/2020 upon evaluation today patient appears to be doing more poorly in regard to his right lower extremity at this point upon evaluation today. Unfortunately he continues to have issues in this regard and I think the biggest issue is controlling his edema. This obviously is not very well controlled at this point is been recommended that he use the Lasix twice a day but he has not been able to do that. Unfortunately I think this is leading to an issue where honestly he is not really able to effectively control his edema and therefore the wounds really are not doing significantly better. I do not think that he is going to be able to keep things under good control unless he is able to control his edema much better. I discussed this again in  great detail with him today. 01/12/2020 good news is patient actually appears to be doing quite well today at this point. He does have an appointment with lymphedema clinic tomorrow. His legs appear healed and the toe on the left is almost completely healed. In general I am very pleased with how things stand at this point. 01/19/2020 upon evaluation today patient appears to actually be doing well in regard to his lower extremities there is nothing open at this point. Fortunately he has done extremely well more recently. Has been seeing lymphedema clinic as well. With that being said he has Velcro wraps for his lower legs as well as his upper legs. The only wound really is on his toe which is the right great toe and this is barely anything even there. With all that being said I think it is good to be appropriate today to go ahead and switch him over to the Velcro compression wraps. 01/26/2020 upon evaluation today patient appears to be doing worse with regard to his lower extremities after  last week switch him to Velcro compression wraps. Unfortunately he lasted less than 24 hours he did not have the sock portion of his Velcro wrap on the left leg and subsequently developed a blister underneath the Velcro portion. Obviously this is not good and not what we were looking for at this point. He states the lymphedema clinic did tell him to wear the wrap for 23 hours and take him off for 1 I am okay with that plan but again right now we got a get things back under control again he may have some cellulitis noted as well. 02/02/2020 upon evaluation today patient unfortunately appears to have several areas of blistering on his bilateral lower extremities today mainly on the feet. His legs do seem to be doing somewhat better which is good news. Fortunately there is no evidence of active infection at this time. No fevers, chills, nausea, vomiting, or diarrhea. 02/16/2020 upon evaluation today patient appears to be doing well at this time with regard to his legs. He has a couple weeping areas on his toes but for the most part everything is doing better and does appear to be sealed up on his legs which is excellent news. We can continue with wrapping him at this point as he had every time we discontinue the wraps he just breaks out with new wounds. There is really no point in is going forward with this at this point. 03/08/2020 upon evaluation today patient actually appears to be doing quite well with regard to his lower extremity ulcers. He has just a very superficial and really almost nonexistent blister on the left lower extremity he has in general done very well with the compression wraps. With that being said I do not see any signs of infection at this time which is good news. 03/29/2020 upon evaluation today patient appears to be doing well with regard to his wounds currently except for where he had several new areas that opened up due to some of the wrap slipping and causing him trouble. He states  he did not realize they had slipped. Nonetheless he has a 1 area on the right and 3 new areas on the left. Fortunately there is no signs of active infection at this time which is great news. 04/05/2020 upon evaluation today patient actually appears to be doing quite well in general in regard to his legs currently. Fortunately there is no signs of active infection at this time. No  fevers, chills, nausea, vomiting, or diarrhea. He tells me next week that he will actually be seen in the lymphedema clinic on Thursday at 10 AM I see him on Wednesday next week. 04/12/2020 upon evaluation today patient appears to be doing very well with regard to his lower extremities bilaterally. In fact he does not appear to have any open wounds at this point which is good news. Fortunately there is no signs of active infection at this time. No fevers, chills, nausea, vomiting, or diarrhea. 04/19/2020 upon evaluation today patient appears to be doing well with regard to his wounds currently on the bilateral lower extremities. There does not appear to be any signs of active infection at this time. Fortunately there is no evidence of systemic infection and overall very pleased at this point. Nonetheless after I held him out last week he literally had blisters the next morning already which swelled up with him being right back here in the clinic. Overall I think that he is just not can be able to be discharged with his legs the way they are he is much to volume overloaded as far as fluid is concerned and that was discussed with him today of also discussed this but should try the clinic nurse manager as well as Dr. Leanord Hawking. 04/26/2020 upon evaluation today patient appears to be doing better with regard to his wounds currently. He is making some progress and overall swelling is under good control with the compression wraps. Fortunately there is no evidence of active infection at this time. 05/10/2020 on evaluation today patient appears  to be doing overall well in regard to his lower extremities bilaterally. He is Tolerating the compression wraps without complication and with what we are seeing currently I feel like that he is making excellent progress. There is no signs of active infection at this time. 05/24/2020 upon evaluation today patient appears to be doing well in regard to his legs. The swelling is actually quite a bit down compared to where it has been in the past. Fortunately there is no sign of active infection at this time which is also good news. With that being said he does have several wounds on his toes that have opened up at this point. 05/31/2020 upon evaluation today patient appears to be doing well with regard to his legs bilaterally where he really has no significant fluid buildup at this point overall he seems to be doing quite well. Very pleased in this regard. With regard to his toes these also seem to be drying up which is excellent. We have continue to wrap him as every time we tried as a transition to the juxta light wraps things just do not seem to get any better. 06/07/2020 upon evaluation today patient appears to be doing well with regard to his right leg at this point. Unfortunately left leg has a lot of blistering he tells me the wrap started to slide down on him when he tried to put his other Velcro wrap over top of it to help keep things in order but nonetheless still had some issues. 06/14/2020 on evaluation today patient appears to be doing well with regard to his lower extremity ulcers and foot ulcers at this point. I feel like everything is actually showing signs of improvement which is great news overall there is no signs of active infection at this time. No fevers, chills, nausea, vomiting, or diarrhea. 06/21/2020 on evaluation today patient actually appears to be doing okay in regard to his  wounds in general. With that being said the biggest issue I see is on his right foot in particular the first  and second toe seem to be doing a little worse due to the fact this is staying very wet. I think he is probably getting need to change out his dressings a couple times in between each week when we see him in regard to his toes in order to keep this drier based on the location and how this is proceeding. 06/28/2020 on evaluation today patient appears to be doing a little bit more poorly overall in regard to the appearance of the skin I am actually somewhat concerned about the possibility of him having a little bit of an infection here. We discussed the course of potentially giving him a doxycycline prescription which he is taken previously with good result. With that being said I do believe that this is potentially mild and at this point easily fixed. I just do not want anything to get any worse. 07/12/2020 upon evaluation today patient actually appears to be making some progress with regard to his legs which is great news there does not appear to be any evidence of active infection. Overall very pleased with where things stand. 07/26/2020 upon evaluation today patient appears to be doing well with regard to his leg ulcers and toe ulcers at this point. He has been tolerating the compression wraps without complication overall very pleased in this regard. 08/09/2020 upon evaluation today patient appears to be doing well with regard to his lower extremities bilaterally. Fortunately there is no signs of active infection overall I am pleased with where things stand. 08/23/2020 on evaluation today patient appears to be doing well with regard to his wound. He has been tolerating the dressing changes without complication. Fortunately there is no signs of active infection at this time. Overall his legs seem to be doing quite well which is great news and I am very pleased in that regard. No fevers, chills, nausea, vomiting, or diarrhea. 09/13/2020 upon evaluation today patient appears to be doing okay in regard to  his lower extremities. He does have a fairly large blister on the right leg which I did remove the blister tissue from today so we can get this to dry out other than that however he seems to be doing quite well. There is no signs of active infection at this time. 09/27/2020 upon evaluation today patient appears to actually be doing some better in regard to his right leg. Fortunately signs of active infection at this time which is great news. No fevers, chills, nausea, vomiting, or diarrhea. 10/04/2020 upon evaluation today patient actually appears to be showing signs of improvement which is great news with regard to his leg ulcers. Fortunately there is no signs of active infection which is great news he is still taking the antibiotics currently. No fevers, chills, nausea, vomiting, or diarrhea. 10/18/2020 on evaluation today patient appears to be doing well with regard to his legs currently. He has been tolerating the dressing changes including the wraps without complication. Fortunately there is no signs of active infection at this time. No fevers, chills, nausea, vomiting, or diarrhea. 10/25/2020 upon evaluation today patient appears to be doing decently well in regard to his wounds currently. He has been tolerating the dressing changes without complication. Overall I feel like he is making good progress albeit slow. Again this is something we can have to continue to wrap for some time to come most likely. Objective Constitutional Obese  and well-hydrated in no acute distress. Vitals Time Taken: 10:31 AM, Height: 70 in, Weight: 380.2 lbs, BMI: 54.5, Temperature: 98.1 F, Pulse: 70 bpm, Respiratory Rate: 19 breaths/min, Blood Pressure: 143/63 mmHg, Capillary Blood Glucose: 184 mg/dl. Respiratory normal breathing without difficulty. Psychiatric this patient is able to make decisions and demonstrates good insight into disease process. Alert and Oriented x 3. pleasant and cooperative. General  Notes: Upon inspection patient's wounds actually are showing signs of fairly good improvement which is great news. I do not see any signs of active infection which is also excellent and in general I think that the weeping on the right leg is to be expected again he has issues like this intermittently. I do not see anything that seems to be ominous however. Integumentary (Hair, Skin) Wound #185 status is Open. Original cause of wound was Blister. The wound is located on the Right,Lateral Lower Leg. The wound measures 9.5cm length x 9.5cm width x 0.1cm depth; 70.882cm^2 area and 7.088cm^3 volume. There is Fat Layer (Subcutaneous Tissue) exposed. There is no tunneling or undermining noted. There is a medium amount of serosanguineous drainage noted. The wound margin is distinct with the outline attached to the wound base. There is large (67-100%) red, pink granulation within the wound bed. There is no necrotic tissue within the wound bed. Wound #189 status is Open. Original cause of wound was Trauma. The wound is located on the Right,Dorsal Upper Arm. The wound measures 2.3cm length x 1.5cm width x 0.1cm depth; 2.71cm^2 area and 0.271cm^3 volume. There is Fat Layer (Subcutaneous Tissue) exposed. There is no tunneling or undermining noted. There is a medium amount of serosanguineous drainage noted. The wound margin is distinct with the outline attached to the wound base. There is large (67-100%) red granulation within the wound bed. There is a small (1-33%) amount of necrotic tissue within the wound bed including Eschar and Adherent Slough. 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 There was a Radio broadcast assistant Compression Therapy Procedure by [name], RN. Post procedure Diagnosis Wound #185: Same as Pre-Procedure There was a Four Layer Compression Therapy Procedure by [name], RN. Post procedure Diagnosis Wound #: Same as Pre-Procedure Plan Follow-up Appointments: Return appointment in 3 weeks. Nurse Visit: - 12/29 and 1/5 Bathing/ Shower/ Hygiene: May shower with protection but do not get wound dressing(s) wet. Edema Control - Lymphedema / SCD / Other: Lymphedema Pumps. Use Lymphedema pumps on leg(s) 2-3 times a day for 45-60 minutes. If wearing any wraps or hose, do not remove them. Continue exercising as instructed. Elevate legs to the level of the heart or above for 30 minutes daily and/or when sitting, a frequency of: - throughout the day Avoid standing for long periods of time. Exercise regularly Other Edema Control Orders/Instructions: - 4 layer compression wrap to left leg Non Wound Condition: Other Non Wound Condition Orders/Instructions: - lotion to leg, 4 layer compression wrap WOUND #185: - Lower Leg Wound Laterality: Right, Lateral Peri-Wound Care: Sween Lotion (Moisturizing lotion) 1 x Per Week/ Discharge Instructions: Apply moisturizing lotion  as directed Prim Dressing: KerraCel Ag Gelling Fiber Dressing, 4x5 in (silver alginate) 1 x Per Week/ ary Discharge Instructions: Apply silver alginate to wound bed as instructed Secondary Dressing: ABD Pad, 8x10 1 x Per Week/ Discharge Instructions: Apply over primary dressing as directed. Com pression Wrap: Unnaboot w/Calamine, 4x10 (in/yd) 1 x Per Week/ Discharge Instructions: Apply Unnaboot as directed. WOUND #189: - Upper Arm Wound Laterality: Dorsal, Right Prim Dressing: Xeroform  Occlusive Gauze Dressing, 4x4 in Every Other Day/ ary Discharge Instructions: Apply to wound bed as instructed Secondary Dressing: ComfortFoam Border, 3x3 in (silicone border) Every Other Day/ Discharge Instructions: Apply over primary dressing as directed. 1. Would recommend currently that we go ahead and continue with the wound care measures as before specifically with regard to the silver alginate dressing to the open areas I feel like this is doing about as good as anything. 2. Also can recommend we continue with Unna boot wrap on the right leg and a 4-layer compression wrap on the left leg this seems to be the best combination for him for the wheezing another. 3. I am also can recommend he is continue to take his fluid pills, elevate his leg, and try to keep the edema as much as possible under good control. He should also be utilizing his pumps. We will see patient back for reevaluation in 3 weeks here in the clinic. If anything worsens or changes patient will contact our office for additional recommendations. We will see him back weekly for nurse visits in between now and then. Electronic Signature(s) Signed: 10/25/2020 1:23:24 PM By: Kevin Kelp PA-C Entered By: [name] on 10/25/2020 13:23:24 -------------------------------------------------------------------------------- SuperBill Details Patient Name: Date of Service: [name], [name] 10/25/2020 Medical Record Number: [id number] Patient Account Number: [id number] Date of Birth/Sex: Treating RN: 1951-06-29 (69 y.o. 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) with ulcer and inflammation of bilateral lower extremity I89.0 Lymphedema, not elsewhere classified E11.622 Type 2 diabetes mellitus with other skin ulcer 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, unspecified L03.116 Cellulitis of left lower limb Facility Procedures CPT4 Code: 21308657 Description: (Facility Use Only) [phone] - APPLY UNNA BOOT RT Modifier: Quantity: 1 CPT4 Code: 52841324 Description: (Facility Use Only) [id number] - APPLY MULTLAY COMPRS LWR LT LEG Modifier: 59 Quantity: 1 Physician Procedures : CPT4 Code Description Modifier [id number] - WC PHYS LEVEL 3 - EST PT ICD-10 Diagnosis Description 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 skin L97.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

## 2020-11-01 ENCOUNTER — Other Ambulatory Visit: Payer: Self-pay

## 2020-11-01 ENCOUNTER — Encounter (HOSPITAL_BASED_OUTPATIENT_CLINIC_OR_DEPARTMENT_OTHER): Payer: Medicare Other | Admitting: Physician Assistant

## 2020-11-01 DIAGNOSIS — I890 Lymphedema, not elsewhere classified: Secondary | ICD-10-CM | POA: Diagnosis not present

## 2020-11-01 DIAGNOSIS — E1140 Type 2 diabetes mellitus with diabetic neuropathy, unspecified: Secondary | ICD-10-CM | POA: Diagnosis not present

## 2020-11-01 DIAGNOSIS — L97221 Non-pressure chronic ulcer of left calf limited to breakdown of skin: Secondary | ICD-10-CM | POA: Diagnosis not present

## 2020-11-01 DIAGNOSIS — I87333 Chronic venous hypertension (idiopathic) with ulcer and inflammation of bilateral lower extremity: Secondary | ICD-10-CM | POA: Diagnosis not present

## 2020-11-01 DIAGNOSIS — L97211 Non-pressure chronic ulcer of right calf limited to breakdown of skin: Secondary | ICD-10-CM | POA: Diagnosis not present

## 2020-11-01 DIAGNOSIS — L03116 Cellulitis of left lower limb: Secondary | ICD-10-CM | POA: Diagnosis not present

## 2020-11-01 DIAGNOSIS — E11622 Type 2 diabetes mellitus with other skin ulcer: Secondary | ICD-10-CM | POA: Diagnosis not present

## 2020-11-02 ENCOUNTER — Telehealth: Payer: Self-pay | Admitting: Family Medicine

## 2020-11-02 NOTE — Telephone Encounter (Signed)
Left message for patient to schedule Annual Wellness Visit.  Please schedule with Nurse Health Advisor Martha Stanley, RN at  [facility]  °

## 2020-11-07 ENCOUNTER — Other Ambulatory Visit: Payer: Self-pay

## 2020-11-07 NOTE — Progress Notes (Signed)
ROBERT, SPERL ([id number]) Visit Report for 11/01/2020 SuperBill Details Patient Name: Date of Service: [name], [name] 11/01/2020 Medical Record Number: [id number] Patient Account Number: [id number] Date of Birth/Sex: Treating RN: November 04, 1951 (70 y.o. 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

## 2020-11-07 NOTE — Progress Notes (Signed)
KERN, GINGRAS ([id number]) Visit Report for 11/01/2020 Arrival Information Details Patient Name: Date of Service: [name] 11/01/2020 10:30 A M Medical Record Number: [id number] Patient Account Number: [id number] Date of Birth/Sex: Treating RN: January 25, 1951 (70 y.o. [name] Primary Care Shahrukh Pasch: Nicoletta Ba Other Clinician: Referring Yittel Emrich: Treating Adeana Grilliot/Extender: Adele Dan in Treatment: 249 Visit Information History Since Last Visit Added or deleted any medications: No Patient Arrived: [name] Any new allergies or adverse reactions: No Arrival Time: 10:34 Had a fall or experienced change in No Accompanied By: self activities of daily living that may affect Transfer Assistance: None risk of falls: Patient Identification Verified: Yes Signs or symptoms of abuse/neglect since last visito No Secondary Verification Process Completed: Yes Hospitalized since last visit: No Patient Requires Transmission-Based Precautions: No Implantable device outside of the clinic excluding No Patient Has Alerts: Yes cellular tissue based products placed in the center since last visit: Has Dressing in Place as Prescribed: Yes Pain Present Now: No Electronic Signature(s) Signed: 11/07/2020 11:46:23 AM By: Karl Ito Entered By: Karl Ito on 11/01/2020 10:35:29 -------------------------------------------------------------------------------- Compression Therapy Details Patient Name: Date of Service: [name], [name] 11/01/2020 10:30 A M Medical Record Number: [id number] Patient Account Number: [id number] Date of Birth/Sex: Treating RN: 1951-04-28 (70 y.o. [name] Primary Care Levorn Oleski: Nicoletta Ba Other Clinician: Referring Amberlynn Tempesta: Treating Zeev Deakins/Extender: Adele Dan in Treatment: 249 Compression Therapy Performed for Wound Assessment: Wound #185 Right,Lateral Lower Leg Performed By:  Clinician Shawn Stall, RN Compression Type: Henriette Combs Electronic Signature(s) Signed: 11/01/2020 2:08:48 PM By: Shawn Stall Entered By: Shawn Stall on 11/01/2020 11:23:19 -------------------------------------------------------------------------------- Compression Therapy Details Patient Name: Date of Service: [name], [name] 11/01/2020 10:30 A M Medical Record Number: [id number] Patient Account Number: [id number] Date of Birth/Sex: Treating RN: [birth year] (70 y.o. [name] Primary Care Artia Singley: Other Clinician: Nicoletta Ba Referring Joss Mcdill: Treating Christyana Corwin/Extender: Adele Dan in Treatment: 249 Compression Therapy Performed for Wound Assessment: NonWound Condition Lymphedema - Left Leg Performed By: Clinician Shawn Stall, RN Compression Type: Four Layer Electronic Signature(s) Signed: 11/01/2020 2:08:48 PM By: Shawn Stall Entered By: Shawn Stall on 11/01/2020 11:23:46 -------------------------------------------------------------------------------- Encounter Discharge Information Details Patient Name: Date of Service: [facility], [name] 11/01/2020 10:30 A M Medical Record Number: [id number] Patient Account Number: [id number] Date of Birth/Sex: Treating RN: 1951/08/29 (70 y.o. [name] Primary Care Gennie Dib: Nicoletta Ba Other Clinician: Referring Bee Marchiano: Treating Elianis Fischbach/Extender: Adele Dan in Treatment: [phone] Encounter Discharge Information Items Discharge Condition: Stable Ambulatory Status: Walker Discharge Destination: Home Transportation: Private Auto Accompanied By: self Schedule Follow-up Appointment: Yes Clinical Summary of Care: Electronic Signature(s) Signed: 11/01/2020 2:08:48 PM By: Shawn Stall Entered By: Shawn Stall on 11/01/2020 11:26:06 -------------------------------------------------------------------------------- Patient/Caregiver Education  Details Patient Name: Date of Service: [name], [name] 12/29/2021andnbsp10:30 A M Medical Record Number: [id number] Patient Account Number: [id number] Date of Birth/Gender: Treating RN: 02/15/1951 (70 y.o. [name] Primary Care Physician: Nicoletta Ba Other Clinician: Referring Physician: Treating Physician/Extender: Adele Dan in Treatment: 249 Education Assessment Education Provided To: Patient Education Topics Provided Venous: Handouts: Controlling Swelling with Multilayered Compression Wraps Methods: Explain/Verbal Responses: Reinforcements needed Electronic Signature(s) Signed: 11/01/2020 2:08:48 PM By: Shawn Stall Entered By: Shawn Stall on 11/01/2020 11:25:52 -------------------------------------------------------------------------------- Wound Assessment Details Patient Name: Date of Service: [name], Kevin 11/01/2020 10:30 A M Medical Record Number: [id number] Patient Account Number: [id number] Date of Birth/Sex:  Treating RN: 02-28-1951 (70 y.o. [name] Primary Care Joely Losier: Nicoletta Ba Other Clinician: Referring Shenelle Klas: Treating Eesa Justiss/Extender: Adele Dan in Treatment: 249 Wound Status Wound Number: [id number] Primary Etiology: Venous Leg Ulcer Wound Location: Right, Lateral Lower Leg Secondary Etiology: Lymphedema Wounding Event: Blister Wound Status: Open Date Acquired: 09/13/2020 Weeks Of Treatment: 6 Clustered Wound: Yes Wound Measurements Length: (cm) 9.5 Width: (cm) 9.5 Depth: (cm) 0.1 Area: (cm) 70.882 Volume: (cm) 7.088 % Reduction in Area: 24.2% % Reduction in Volume: 24.2% Wound Description Classification: Full Thickness Without Exposed Support Structur es Treatment Notes Wound #185 (Lower Leg) Wound Laterality: Right, Lateral Cleanser Peri-Wound Care Sween Lotion (Moisturizing lotion) Discharge Instruction: Apply moisturizing lotion as  directed Topical Primary Dressing KerraCel Ag Gelling Fiber Dressing, 4x5 in (silver alginate) Discharge Instruction: Apply silver alginate to wound bed as instructed Secondary Dressing ABD Pad, 8x10 Discharge Instruction: Apply over primary dressing as directed. Secured With Compression Wrap Unnaboot w/Calamine, 4x10 (in/yd) Discharge Instruction: Apply Unnaboot as directed. Compression Stockings Add-Ons Electronic Signature(s) Signed: 11/06/2020 2:31:34 PM By: Zenaida Deed RN, BSN Signed: 11/07/2020 11:46:23 AM By: Karl Ito Entered By: Karl Ito on 11/01/2020 10:36:26 -------------------------------------------------------------------------------- Wound Assessment Details Patient Name: Date of Service: [name], Kayzen J. 11/01/2020 10:30 A M Medical Record Number: [id number] Patient Account Number: [id number] Date of Birth/Sex: Treating RN: 1951/07/02 (70 y.o. [name] Primary Care Isay Perleberg: Nicoletta Ba Other Clinician: Referring Janaa Acero: Treating Arliss Frisina/Extender: Adele Dan in Treatment: 249 Wound Status Wound Number: [id number] Primary Etiology: Abrasion Wound Location: Right, Dorsal Upper Arm Wound Status: Open Wounding Event: Trauma Date Acquired: 10/18/2020 Weeks Of Treatment: 1 Clustered Wound: No Wound Measurements Length: (cm) 2.3 Width: (cm) 1.5 Depth: (cm) 0.1 Area: (cm) 2.71 Volume: (cm) 0.271 % Reduction in Area: 0% % Reduction in Volume: 0% Wound Description Classification: Full Thickness Without Exposed Support Structur es Treatment Notes Wound #189 (Upper Arm) Wound Laterality: Dorsal, Right Cleanser Peri-Wound Care Topical Primary Dressing Xeroform Occlusive Gauze Dressing, 4x4 in Discharge Instruction: Apply to wound bed as instructed Secondary Dressing ComfortFoam Border, 3x3 in (silicone border) Discharge Instruction: Apply over primary dressing as directed. Secured With Compression  Wrap Compression Stockings Facilities manager) Signed: 11/06/2020 2:31:34 PM By: Zenaida Deed RN, BSN Signed: 11/07/2020 11:46:23 AM By: Karl Ito Entered By: Karl Ito on 11/01/2020 10:36:26 -------------------------------------------------------------------------------- Vitals Details Patient Name: Date of Service: [name], Isack J. 11/01/2020 10:30 A M Medical Record Number: [id number] Patient Account Number: [id number] Date of Birth/Sex: Treating RN: 13-Feb-1951 (70 y.o. [name] Primary Care Loveah Like: Nicoletta Ba Other Clinician: Referring Arlethia Basso: Treating Lindsie Simar/Extender: Adele Dan in Treatment: 249 Vital Signs Time Taken: 10:35 Temperature (F): 98.1 Height (in): 70 Pulse (bpm): 70 Weight (lbs): 380.2 Respiratory Rate (breaths/min): 19 Body Mass Index (BMI): 54.5 Blood Pressure (mmHg): 143/63 Capillary Blood Glucose (mg/dl): 267 Reference Range: 80 - 120 mg / dl Electronic Signature(s) Signed: 11/07/2020 11:46:23 AM By: Karl Ito Entered By: Karl Ito on 11/01/2020 10:36:09

## 2020-11-08 ENCOUNTER — Encounter (HOSPITAL_BASED_OUTPATIENT_CLINIC_OR_DEPARTMENT_OTHER): Payer: Medicare Other | Attending: Physician Assistant | Admitting: Physician Assistant

## 2020-11-08 ENCOUNTER — Ambulatory Visit (INDEPENDENT_AMBULATORY_CARE_PROVIDER_SITE_OTHER): Payer: Medicare Other

## 2020-11-08 DIAGNOSIS — L97818 Non-pressure chronic ulcer of other part of right lower leg with other specified severity: Secondary | ICD-10-CM | POA: Diagnosis not present

## 2020-11-08 DIAGNOSIS — E1140 Type 2 diabetes mellitus with diabetic neuropathy, unspecified: Secondary | ICD-10-CM | POA: Diagnosis not present

## 2020-11-08 DIAGNOSIS — L97519 Non-pressure chronic ulcer of other part of right foot with unspecified severity: Secondary | ICD-10-CM | POA: Insufficient documentation

## 2020-11-08 DIAGNOSIS — E11622 Type 2 diabetes mellitus with other skin ulcer: Secondary | ICD-10-CM | POA: Insufficient documentation

## 2020-11-08 DIAGNOSIS — I890 Lymphedema, not elsewhere classified: Secondary | ICD-10-CM | POA: Diagnosis not present

## 2020-11-08 DIAGNOSIS — I87333 Chronic venous hypertension (idiopathic) with ulcer and inflammation of bilateral lower extremity: Secondary | ICD-10-CM | POA: Insufficient documentation

## 2020-11-08 DIAGNOSIS — L03119 Cellulitis of unspecified part of limb: Secondary | ICD-10-CM

## 2020-11-08 DIAGNOSIS — I872 Venous insufficiency (chronic) (peripheral): Secondary | ICD-10-CM

## 2020-11-08 DIAGNOSIS — L97812 Non-pressure chronic ulcer of other part of right lower leg with fat layer exposed: Secondary | ICD-10-CM | POA: Insufficient documentation

## 2020-11-08 DIAGNOSIS — N289 Disorder of kidney and ureter, unspecified: Secondary | ICD-10-CM | POA: Insufficient documentation

## 2020-11-08 DIAGNOSIS — E1129 Type 2 diabetes mellitus with other diabetic kidney complication: Secondary | ICD-10-CM | POA: Insufficient documentation

## 2020-11-08 DIAGNOSIS — E11621 Type 2 diabetes mellitus with foot ulcer: Secondary | ICD-10-CM | POA: Diagnosis not present

## 2020-11-08 DIAGNOSIS — L98492 Non-pressure chronic ulcer of skin of other sites with fat layer exposed: Secondary | ICD-10-CM | POA: Diagnosis not present

## 2020-11-08 NOTE — Progress Notes (Signed)
Kevin Powellis a 70 y.o.malepresents to the office today for bicillininjections, per physician's orders.  Bicillin 600,000 Unitswas administeredbilaterally in Right and Left upper outter quadtoday. Patient tolerated injection. 

## 2020-11-09 NOTE — Progress Notes (Addendum)
[name], [name] ([id number]) Visit Report for 11/08/2020 Chief Complaint Document Details Patient Name: Date of Service: Kevin EFRAIM, VANALLEN 11/08/2020 9:30 A M Medical Record Number: [id number] Patient Account Number: [id number] Date of Birth/Sex: Treating RN: [birth year] (70 y.o. [name] Primary Care Provider: Nicoletta Ba Other Clinician: Referring Provider: Treating Provider/Extender: Adele Dan in Treatment: 250 Information Obtained from: Patient Chief Complaint patient is here for evaluation venous/lymphedema weeping Electronic Signature(s) Signed: 11/08/2020 9:37:32 AM By: Lenda Kelp PA-C Entered By: Lenda Kelp on 11/08/2020 09:37:32 -------------------------------------------------------------------------------- Problem List Details Patient Name: Date of Service: [name], [name] 11/08/2020 9:30 A M Medical Record Number: [id number] Patient Account Number: [id number] Date of Birth/Sex: Treating RN: 04/07/1951 (70 y.o. [name] Primary Care Provider: Nicoletta Ba Other Clinician: Referring Provider: Treating Provider/Extender: Adele Dan in Treatment: 250 Active Problems ICD-10 Encounter Code Description Active Date MDM Diagnosis 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 skin 06/30/2018 No Yes L97.221 Non-pressure chronic ulcer of left calf limited to breakdown of skin 09/30/2016 No Yes 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 of 01/22/2016 No Yes bilateral lower extremity I89.0 Lymphedema, not elsewhere classified 01/22/2016 No Yes E11.622 Type 2 diabetes mellitus with other skin ulcer 01/22/2016 No Yes 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, unspecified 01/22/2016 No Yes L03.116 Cellulitis of left lower limb 04/01/2017 No Yes Inactive Problems ICD-10 Code Description Active Date Inactive Date L97.211  Non-pressure chronic ulcer of right calf limited to breakdown of skin 06/30/2017 06/30/2017 L97.521 Non-pressure chronic ulcer of other 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 of skin 04/27/2018 04/27/2018 L03.115 Cellulitis of right lower limb 12/22/2017 12/22/2017 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 06/30/2018 06/30/2018 L97.511 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 of skin 06/30/2018 06/30/2018 Resolved Problems Electronic Signature(s) Signed: 11/08/2020 9:37:08 AM By: Lenda Kelp PA-C Entered By: Lenda Kelp on 11/08/2020 09:37:07
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
## 2020-11-15 ENCOUNTER — Encounter (HOSPITAL_BASED_OUTPATIENT_CLINIC_OR_DEPARTMENT_OTHER): Payer: Medicare Other | Admitting: Physician Assistant

## 2020-11-15 ENCOUNTER — Other Ambulatory Visit: Payer: Self-pay

## 2020-11-15 DIAGNOSIS — E1140 Type 2 diabetes mellitus with diabetic neuropathy, unspecified: Secondary | ICD-10-CM | POA: Diagnosis not present

## 2020-11-15 DIAGNOSIS — L97819 Non-pressure chronic ulcer of other part of right lower leg with unspecified severity: Secondary | ICD-10-CM | POA: Diagnosis not present

## 2020-11-15 DIAGNOSIS — I87311 Chronic venous hypertension (idiopathic) with ulcer of right lower extremity: Secondary | ICD-10-CM | POA: Diagnosis not present

## 2020-11-15 DIAGNOSIS — I87333 Chronic venous hypertension (idiopathic) with ulcer and inflammation of bilateral lower extremity: Secondary | ICD-10-CM | POA: Diagnosis not present

## 2020-11-15 DIAGNOSIS — L97812 Non-pressure chronic ulcer of other part of right lower leg with fat layer exposed: Secondary | ICD-10-CM | POA: Diagnosis not present

## 2020-11-15 DIAGNOSIS — E1121 Type 2 diabetes mellitus with diabetic nephropathy: Secondary | ICD-10-CM | POA: Diagnosis not present

## 2020-11-15 DIAGNOSIS — E11622 Type 2 diabetes mellitus with other skin ulcer: Secondary | ICD-10-CM | POA: Diagnosis not present

## 2020-11-15 DIAGNOSIS — L97519 Non-pressure chronic ulcer of other part of right foot with unspecified severity: Secondary | ICD-10-CM | POA: Diagnosis not present

## 2020-11-15 DIAGNOSIS — E1129 Type 2 diabetes mellitus with other diabetic kidney complication: Secondary | ICD-10-CM | POA: Diagnosis not present

## 2020-11-15 DIAGNOSIS — Z794 Long term (current) use of insulin: Secondary | ICD-10-CM | POA: Diagnosis not present

## 2020-11-15 DIAGNOSIS — L98492 Non-pressure chronic ulcer of skin of other sites with fat layer exposed: Secondary | ICD-10-CM | POA: Diagnosis not present

## 2020-11-15 DIAGNOSIS — I890 Lymphedema, not elsewhere classified: Secondary | ICD-10-CM | POA: Diagnosis not present

## 2020-11-15 DIAGNOSIS — N289 Disorder of kidney and ureter, unspecified: Secondary | ICD-10-CM | POA: Diagnosis not present

## 2020-11-15 DIAGNOSIS — E11621 Type 2 diabetes mellitus with foot ulcer: Secondary | ICD-10-CM | POA: Diagnosis not present

## 2020-11-15 NOTE — Progress Notes (Signed)
[name], [name] ([id number]) Visit Report for 11/15/2020 Arrival Information Details Patient Name: Date of Service: [name], [name] 11/15/2020 10:00 A M Medical Record Number: [id number] Patient Account Number: [id number] Date of Birth/Sex: Treating RN: 04-23-1951 (70 y.o. [name], [name] Primary Care Shae Augello: [name] Other Clinician: Referring Janaye Corp: Treating Kimiya Brunelle/Extender: [name] in Treatment: 76 Visit Information History Since Last Visit Added or deleted any medications: No Patient Arrived: Ambulatory Any new allergies or adverse reactions: No Arrival Time: 10:31 Had a fall or experienced change in No Accompanied By: self activities of daily living that may affect Transfer Assistance: None risk of falls: Patient Identification Verified: Yes Signs or symptoms of abuse/neglect since last visito No Secondary Verification Process Completed: Yes Hospitalized since last visit: No Patient Requires Transmission-Based Precautions: No Implantable device outside of the clinic excluding No Patient Has Alerts: Yes cellular tissue based products placed in the [name] since last visit: Has Dressing in Place as Prescribed: Yes Has Compression in Place as Prescribed: Yes Pain Present Now: No Electronic Signature(s) Signed: 11/15/2020 4:35:23 PM By: Deon Pilling Entered By: Deon Pilling on 11/15/2020 10:32:05 -------------------------------------------------------------------------------- Compression Therapy Details Patient Name: Date of Service: [name], [name] 11/15/2020 10:00 A M Medical Record Number: [id number] Patient Account Number: [id number] Date of Birth/Sex: Treating RN: April 26, 1951 (70 y.o. [name] Primary Care Esmee Fallaw: [name] Other Clinician: Referring Whitley Strycharz: Treating Brylee Berk/Extender: [name] in Treatment: 251 Compression Therapy Performed for Wound Assessment: Wound #185  Right,Circumferential Lower Leg Performed By: Clinician Deon Pilling, RN Compression Type: [name] Post Procedure Diagnosis Same as Pre-procedure Electronic Signature(s) Signed: 11/15/2020 5:15:42 PM By: Baruch Gouty RN, BSN Entered By: Baruch Gouty on 11/15/2020 11:05:31 -------------------------------------------------------------------------------- Compression Therapy Details Patient Name: Date of Service: [name], [name] 11/15/2020 10:00 A M Medical Record Number: [id number] Patient Account Number: [id number] Date of Birth/Sex: Treating RN: [birth year] (70 y.o. [name] Primary Care Johonna Binette: [name] Other Clinician: Referring Renisha Cockrum: Treating Shenay Torti/Extender: [name] in Treatment: 251 Compression Therapy Performed for Wound Assessment: NonWound Condition Lymphedema - Left Leg Performed By: Clinician Deon Pilling, RN Compression Type: Four Layer Post Procedure Diagnosis Same as Pre-procedure Electronic Signature(s) Signed: 11/15/2020 5:15:42 PM By: Baruch Gouty RN, BSN Entered By: Baruch Gouty on 11/15/2020 11:06:02 -------------------------------------------------------------------------------- Encounter Discharge Information Details Patient Name: Date of Service: [name], [name] 11/15/2020 10:00 A M Medical Record Number: [id number] Patient Account Number: [id number] Date of Birth/Sex: Treating RN: [birth year] (70 y.o. [name] Primary Care Rashid Whitenight: [name] Other Clinician: Referring Azar South: Treating Yasha Tibbett/Extender: [name] in Treatment: [phone] Encounter Discharge Information Items Discharge Condition: Stable Ambulatory Status: Walker Discharge Destination: Home Transportation: Private Auto Accompanied By: self Schedule Follow-up Appointment: Yes Clinical Summary of Care: Electronic Signature(s) Signed: 11/15/2020 4:35:23 PM By: Deon Pilling Entered By: Deon Pilling on 11/15/2020 12:02:40 -------------------------------------------------------------------------------- Lower Extremity Assessment Details Patient Name: Date of Service: [name], [name] 11/15/2020 10:00 A M Medical Record Number: [id number] Patient Account Number: [id number] Date of Birth/Sex: Treating RN: 1951-05-28 (70 y.o. [name] Primary Care Carole Deere: [name] Other Clinician: Referring Elzena Muston: Treating Keywon Mestre/Extender: [name] in Treatment: 251 Edema Assessment Assessed: [name]: Yes] [name]: Yes] Edema: [Left: Yes] [Right: Yes] Calf Left: Right: Point of Measurement: 25 cm From Medial Instep 43 cm 44 cm Ankle Left: Right: Point of Measurement: 9 cm From Medial Instep 23 cm 26.5  cm Vascular Assessment Pulses: Dorsalis Pedis Palpable: [Left:Yes] [Right:Yes] Posterior Tibial Palpable: [Left:Yes] [Right:Yes] Electronic Signature(s) Signed: 11/15/2020 4:35:23 PM By: Deon Pilling Entered By: Deon Pilling on 11/15/2020 10:30:38 -------------------------------------------------------------------------------- Duval Details Patient Name: Date of Service: [name], [name] 11/15/2020 10:00 A M Medical Record Number: [id number] Patient Account Number: [id number] Date of Birth/Sex: Treating RN: 1950-11-13 (70 y.o. [name] Primary Care Romond Pipkins: [name] Other Clinician: Referring Dominion Kathan: Treating Shyloh Derosa/Extender: [name] in Treatment: [phone] Active Inactive Venous Leg Ulcer Nursing Diagnoses: Actual venous Insuffiency (use after diagnosis is confirmed) Goals: Patient will maintain optimal edema control Date Initiated: 09/10/2016 Target Resolution Date: 11/24/2020 Goal Status: Active Verify adequate tissue perfusion prior to therapeutic compression application Date Initiated: 09/10/2016 Date Inactivated: 11/28/2016 Goal  Status: Met Interventions: Assess peripheral edema status every visit. Compression as ordered Provide education on venous insufficiency Notes: Electronic Signature(s) Signed: 11/15/2020 5:15:42 PM By: Baruch Gouty RN, BSN Entered By: Baruch Gouty on 11/15/2020 11:03:52 -------------------------------------------------------------------------------- Pain Assessment Details Patient Name: Date of Service: [name], [name] 11/15/2020 10:00 A M Medical Record Number: [id number] Patient Account Number: [id number] Date of Birth/Sex: Treating RN: 24-Aug-1951 (70 y.o. [name] Primary Care Akyah Lagrange: [name] Other Clinician: Referring Letcher Schweikert: Treating Oberia Beaudoin/Extender: [name] in Treatment: 251 Active Problems Location of Pain Severity and Description of Pain Patient Has Paino No Site Locations Rate the pain. Current Pain Level: 0 Pain Management and Medication Current Pain Management: Electronic Signature(s) Signed: 11/15/2020 4:35:23 PM By: Deon Pilling Entered By: Deon Pilling on 11/15/2020 10:30:51 -------------------------------------------------------------------------------- Patient/Caregiver Education Details Patient Name: Date of Service: [name], [name] 1/12/2022andnbsp10:00 A M Medical Record Number: [id number] Patient Account Number: [id number] Date of Birth/Gender: Treating RN: 22-Dec-1950 (69 y.o. [name] Primary Care Physician: [name] Other Clinician: Referring Physician: Treating Physician/Extender: [name] in Treatment: 251 Education Assessment Education Provided To: Patient Education Topics Provided Venous: Methods: Explain/Verbal Responses: Reinforcements needed, State content correctly Electronic Signature(s) Signed: 11/15/2020 5:15:42 PM By: Baruch Gouty RN, BSN Entered By: Baruch Gouty on 11/15/2020  11:04:20 -------------------------------------------------------------------------------- Wound Assessment Details Patient Name: Date of Service: [name], [name] 11/15/2020 10:00 A M Medical Record Number: [id number] Patient Account Number: [id number] Date of Birth/Sex: Treating RN: 1951-09-05 (70 y.o. [name], [name] Primary Care Sreeja Spies: [name] Other Clinician: Referring Cirilo Canner: Treating Maddoxx Burkitt/Extender: [name] in Treatment: 251 Wound Status Wound Number: [id number] Primary Venous Leg Ulcer Etiology: Wound Location: Right, Circumferential Lower Leg Secondary Lymphedema Wounding Event: Blister Etiology: Date Acquired: 09/13/2020 Wound Open Weeks Of Treatment: 8 Status: Clustered Wound: Yes Comorbid Chronic sinus problems/congestion, Arrhythmia, Hypertension, History: Peripheral Arterial Disease, Type II Diabetes, History of Burn, Gout, Confinement Anxiety Wound Measurements Length: (cm) 10 Width: (cm) 31 Depth: (cm) 0.1 Area: (cm) 243.473 Volume: (cm) 24.347 % Reduction in Area: -160.4% % Reduction in Volume: -160.4% Epithelialization: Small (1-33%) Tunneling: No Undermining: No Wound Description Classification: Full Thickness Without Exposed Support Structures Wound Margin: Distinct, outline attached Exudate Amount: Large Exudate Type: Purulent Exudate Color: yellow, brown, green Foul Odor After Cleansing: No Slough/Fibrino Yes Wound Bed Granulation Amount: Medium (34-66%) Exposed Structure Granulation Quality: Red, Pink Fascia Exposed: No Necrotic Amount: Medium (34-66%) Fat Layer (Subcutaneous Tissue) Exposed: No Necrotic Quality: Adherent Slough Tendon Exposed: No Muscle Exposed: No Joint Exposed: No Bone Exposed: No Treatment Notes Wound #185 (Lower Leg) Wound Laterality: Right, Circumferential Cleanser Peri-Wound Care Sween Lotion (Moisturizing lotion)  Discharge Instruction: Apply moisturizing lotion as  directed Topical Primary Dressing KerraCel Ag Gelling Fiber Dressing, 4x5 in (silver alginate) Discharge Instruction: Apply silver alginate to wound bed as instructed Secondary Dressing ABD Pad, 8x10 Discharge Instruction: Apply over primary dressing as directed. CarboFLEX Odor Control Dressing, 4x4 in Discharge Instruction: Apply over primary dressing as directed. Secured With Compression Wrap Unnaboot w/Calamine, 4x10 (in/yd) Discharge Instruction: Apply Unnaboot as directed. Compression Stockings Add-Ons Notes 4 layer compression wrap to LLE. Electronic Signature(s) Signed: 11/15/2020 4:35:23 PM By: Deon Pilling Entered By: Deon Pilling on 11/15/2020 10:29:41 -------------------------------------------------------------------------------- Wound Assessment Details Patient Name: Date of Service: [name], JAVID. 11/15/2020 10:00 A M Medical Record Number: [id number] Patient Account Number: [id number] Date of Birth/Sex: Treating RN: 1951/06/17 (70 y.o. [name] Primary Care Alexarae Oliva: [name] Other Clinician: Referring Abhinav Mayorquin: Treating Lindi Abram/Extender: [name] in Treatment: 251 Wound Status Wound Number: [id number] Primary Etiology: Abrasion Wound Location: Right, Dorsal Upper Arm Wound Status: Healed - Epithelialized Wounding Event: Trauma Date Acquired: 10/18/2020 Weeks Of Treatment: 3 Clustered Wound: No Wound Measurements Length: (cm) Width: (cm) Depth: (cm) Area: (cm) Volume: (cm) 0 % Reduction in Area: 100% 0 % Reduction in Volume: 100% 0 0 0 Wound Description Classification: Full Thickness Without Exposed Support Structur es Treatment Notes Wound #189 (Upper Arm) Wound Laterality: Dorsal, Right Cleanser Peri-Wound Care Topical Primary Dressing Secondary Dressing Secured With Compression Wrap Compression Stockings Add-Ons Notes 4 layer compression wrap to LLE. Electronic Signature(s) Signed:  11/15/2020 4:35:23 PM By: Deon Pilling Entered By: Deon Pilling on 11/15/2020 10:29:55 -------------------------------------------------------------------------------- Wound Assessment Details Patient Name: Date of Service: [name], WINTON. 11/15/2020 10:00 A M Medical Record Number: [id number] Patient Account Number: [id number] Date of Birth/Sex: Treating RN: 1951-05-31 (70 y.o. [name] Primary Care Brigg Cape: [name] Other Clinician: Referring Sarath Privott: Treating Tanisha Lutes/Extender: [name] in Treatment: 251 Wound Status Wound Number: [id number] Primary Diabetic Wound/Ulcer of the Lower Extremity Etiology: Wound Location: Right T Second oe Wound Open Wounding Event: Gradually Appeared Status: Date Acquired: 11/10/2020 Comorbid Chronic sinus problems/congestion, Arrhythmia, Hypertension, Weeks Of Treatment: 0 History: Peripheral Arterial Disease, Type II Diabetes, History of Burn, Clustered Wound: No Gout, Confinement Anxiety Wound Measurements Length: (cm) 2 Width: (cm) 1 Depth: (cm) 0.1 Area: (cm) 1.571 Volume: (cm) 0.157 % Reduction in Area: % Reduction in Volume: Epithelialization: Small (1-33%) Tunneling: No Undermining: No Wound Description Classification: Grade 2 Wound Margin: Distinct, outline attached Exudate Amount: Medium Exudate Type: Serosanguineous Exudate Color: red, brown Foul Odor After Cleansing: No Slough/Fibrino Yes Wound Bed Granulation Amount: Large (67-100%) Exposed Structure Granulation Quality: Red, Pink Fascia Exposed: No Necrotic Amount: Small (1-33%) Fat Layer (Subcutaneous Tissue) Exposed: No Necrotic Quality: Adherent Slough Tendon Exposed: No Muscle Exposed: No Joint Exposed: No Bone Exposed: No Treatment Notes Wound #190 (Toe Second) Wound Laterality: Right Cleanser Peri-Wound Care Topical Primary Dressing KerraCel Ag Gelling Fiber Dressing, 2x2 in (silver alginate) Discharge  Instruction: Apply silver alginate to wound bed as instructed Secondary Dressing Woven Gauze Sponges 2x2 in Discharge Instruction: Apply over primary dressing as directed. Secured With Conforming Stretch Gauze Bandage, Sterile 2x75 (in/in) Discharge Instruction: Secure with stretch gauze as directed. Compression Wrap Compression Stockings Add-Ons Notes 4 layer compression wrap to LLE. Electronic Signature(s) Signed: 11/15/2020 4:35:23 PM By: Deon Pilling Entered By: Deon Pilling on 11/15/2020 10:28:39 -------------------------------------------------------------------------------- Vitals Details Patient Name: Date of Service: [name], Alston J. 11/15/2020 10:00 A M Medical Record Number: [id number] Patient Account Number:  [id number] Date of Birth/Sex: Treating RN: May 13, 1951 (70 y.o. [name] Primary Care Shawna Kiener: [name] Other Clinician: Referring Elice Crigger: Treating Silvio Sausedo/Extender: [name] in Treatment: 251 Vital Signs Time Taken: 10:31 Temperature (F): 97.6 Height (in): 70 Pulse (bpm): 65 Weight (lbs): 380.2 Respiratory Rate (breaths/min): 22 Body Mass Index (BMI): 54.5 Blood Pressure (mmHg): 147/68 Capillary Blood Glucose (mg/dl): 168 Reference Range: 80 - 120 mg / dl Electronic Signature(s) Signed: 11/15/2020 4:35:23 PM By: Deon Pilling Entered By: Deon Pilling on 11/15/2020 10:31:42

## 2020-11-15 NOTE — Progress Notes (Addendum)
[name], [name] ([id number]) Visit Report for 11/15/2020 Chief Complaint Document Details Patient Name: Date of Service: [name] 11/15/2020 10:00 A M Medical Record Number: [id number] Patient Account Number: [id number] Date of Birth/Sex: Treating RN: 1951/05/06 (70 y.o. M) Primary Care Provider: Nicoletta Ba Other Clinician: Referring Provider: Treating Provider/Extender: Adele Dan in Treatment: 251 Information Obtained from: Patient Chief Complaint patient is here for evaluation venous/lymphedema weeping Electronic Signature(s) Signed: 11/15/2020 10:38:30 AM By: Lenda Kelp PA-C Entered By: Lenda Kelp on 11/15/2020 10:38:29 -------------------------------------------------------------------------------- HPI Details Patient Name: Date of Service: Kevin [name]. 11/15/2020 10:00 A M Medical Record Number: [id number] Patient Account Number: [id number] Date of Birth/Sex: Treating RN: 1950-11-24 (70 y.o. M) Primary Care Provider: Nicoletta Ba Other Clinician: Referring Provider: Treating Provider/Extender: Adele Dan in Treatment: 251 History of Present Illness HPI Description: Referred by PCP for consultation. Patient has long standing history of BLE venous stasis, no prior ulcerations. At beginning of month, developed cellulitis and weeping. Received IM Rocephin followed by Keflex and resolved. Wears compression stocking, appr 6 months old. Not sure strength. No present drainage. 01/22/16 this is a patient who is a type II diabetic on insulin. 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 He tells me he religiously wears pressure stockings of uncertain strength. He was here with weeping edema about 8 months ago but did not have an open wound. Roughly a month ago he had a reopening on his bilateral legs. He is been using bandages and Neosporin. He does not complain of pain. 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 Last BUN/creatinine I have is from 11/20/15 at 13 and 1.0 respectively 01/29/16; patient arrives today having tolerated the Profore wrap. He brought in his stockings and these are 18 mmHg stockings he bought from [employer]. The compression here is likely inadequate. He does not complain of pain or excessive drainage she has no systemic symptoms. The wound on the right looks improved as does the one on the left although one on the left is more substantial with still tissue at risk below the actual wound area on the bilateral posterior calf 02/05/16; patient arrives with poor edema control. He states that we did put a 4 layer compression on it last week. No weight appear 5 this. 02/12/16; the area on the posterior right Has healed. The left Has a substantial wound that has necrotic surface eschar that requires a debridement with a curette. 02/16/16;the patient called or a Nurse visit secondary to increased swelling. He had been in earlier in the week with his right leg healed. He was transitioned to is on pressure stocking on the right leg with the only open wound on the left, a substantial area on the left posterior calf. Note he has a history of severe lower extremity edema, he has a history of chronic atrial fibrillation but not heart failure per my notes but I'll need to research this. He is not complaining of chest pain shortness of breath or orthopnea. The intake nurse noted blisters on the previously closed right leg 02/19/16; this is the patient's regular visit day. I see him on Friday with escalating edema new wounds on the right leg and clear signs of at least right ventricular heart failure. I increased his Lasix to 40 twice a day. He is returning currently  in follow-up. States he is noticed a decrease in that the edema 02/26/16 patient's legs have much less  edema. There is nothing really open on the right leg. The left leg has improved condition of the large superficial wound on the posterior left leg 03/04/16; edema control is very much better. The patient's right leg wounds have healed. On the left leg he continues to have severe venous inflammation on the posterior aspect of the left leg. There is no tenderness and I don't think any of this is cellulitis. 03/11/16; patient's right leg is married healed and he is in his own stocking. The patient's left leg has deteriorated somewhat. There is a lot of erythema around the wound on the posterior left leg. There is also a significant rim of erythema posteriorly just above where the wrap would've ended there is a new wound in this location and a lot of tenderness. Can't rule out cellulitis in this area. 03/15/16; patient's right leg remains healed and he is in his own stocking. The patient's left leg is much better than last review. His major wound on the posterior aspect of his left Is almost fully epithelialized. He has 3 small injuries from the wraps. Really. Erythema seems a lot better on antibiotics 03/18/16; right leg remains healed and he is in his own stocking. The patient's left leg is much better. The area on the posterior aspect of the left calf is fully epithelialized. His 3 small injuries which were wrap injuries on the left are improved only one seems still open his erythema has resolved 03/25/16; patient's right leg remains healed and he is in his own stocking. There is no open area today on the left leg posterior leg is completely closed up. His wrap injuries at the superior aspect of his leg are also resolved. He looks as though he has some irritation on the dorsal ankle but this is fully epithelialized without evidence of infection. 03/28/16; we discharged this patient on Monday. Transitioned him into his own stocking. There were problems almost immediately with uncontrolled swelling weeping edema  multiple some of which have opened. He does not feel systemically unwell in particular no chest pain no shortness of breath and he does not feel 04/08/16; the edema is under better control with the Profore light wrap but he still has pitting edema. There is one large wound anteriorly 2 on the medial aspect of his left leg and 3 small areas on the superior posterior calf. Drainage is not excessive he is tolerating a Profore light well 04/15/16; put a Profore wrap on him last week. This is controlled is edema however he had a lot of pain on his left anterior foot most of his wounds are healed 04/22/16 once again the patient has denuded areas on the left anterior foot which he states are because his wrap slips up word. He saw his primary physician today is on Lasix 40 twice a day and states that he his weight is down 20 pounds over the last 3 months. 04/29/16: Much improved. left anterior foot much improved. He is now on Lasix 80 mg per day. Much improved edema control 05/06/16; I was hoping to be able to discharge him today however once again he has blisters at a low level of where the compression was placed last week mostly on his left lateral but also his left medial leg and a small area on the anterior part of the left foot. 05/09/16; apparently the patient went home after his appointment  on 7/4 later in the evening developing pain in his upper medial thigh together with subjective fever and chills although his temperature was not taken. The pain was so intense he felt he would probably have to call 911. However he then remembered that he had leftover doxycycline from a previous round of antibiotics and took these. By the next morning he felt a lot better. He called and spoke to one of our nurses and I approved doxycycline over the phone thinking that this was in relation to the wounds we had previously seen although they were definitely were not. The patient feels a lot better old fever no chills he is still  working. Blood sugars are reasonably controlled 05/13/16; patient is back in for review of his cellulitis on his anterior medial upper thigh. He is taking doxycycline this is a lot better. Culture I did of the nodular area on the dorsal aspect of his foot grew MRSA this also looks a lot better. 05/20/16; the patient is cellulitis on the medial upper thigh has resolved. All of his wound areas including the left anterior foot, areas on the medial aspect of the left calf and the lateral aspect of the calf at all resolved. He has a new blister on the left dorsal foot at the level of the fourth toe this was excised. No evidence of infection 05/27/16; patient continues to complain weeping edema. He has new blisterlike wounds on the left anterior lateral and posterior lateral calf at the top of his wrap levels. The area on his left anterior foot appears better. He is not complaining of fever, pain or pruritus in his feet. 05/30/16; the patient's blisters on his left anterior leg posterior calf all look improved. He did not increase the Lasix 100 mg as I suggested because he was going to run out of his 40 mg tablets. He is still having weeping edema of his toes 06/03/16; I renewed his Lasix at 80 mg once a day as he was about to run out when I last saw him. He is on 80 mg of Lasix now. I have asked him to cut down on the excessive amount of water he was drinking and asked him to drink according to his thirst mechanisms 06/12/2016 -- was seen 2 days ago and was supposed to wear his compression stockings at home but he is developed lymphedema and superficial blisters on the left lower extremity and hence came in for a review 06/24/16; the remaining wound is on his left anterior leg. He still has edema coming from between his toes. There is lymphedema here however his edema is generally better than when I last saw this. He has a history of atrial fibrillation but does not have a known history of congestive heart  failure nevertheless I think he probably has this at least on a diastolic basis. 07/01/16 I reviewed his echocardiogram from January 2017. This was essentially normal. He did not have LVH, EF of 55-60%. His right ventricular function was normal although he did have trivial tricuspid and pulmonic regurgitation. This is not audible on exam however. I increased his Lasix to do massive edema in his legs well above his knees I think in early July. He was also drinking an excessive amount of water at the time. 07/15/16; missed his appointment last week because of the Labor Day holiday on Monday. He could not get another appointment later in the week. Started to feel the wrap digging in superiorly so we remove the top  half and the bottom half of his wrap. He has extensive erythema and blistering superiorly in the left leg. Very tender. Very swollen. Edema in his foot with leaking edema fluid. He has not been systemically unwell 07/22/16; the area on the left leg laterally required some debridement. The medial wounds look more stable. His wrap injury wounds appear to have healed. Edema and his foot is better, weeping edema is also better. He tells me he is meeting with the supplier of the external compression pumps at work 08/05/16; the patient was on vacation last week in [facility]. His wrap is been on for an extended period of time. Also over the weekend he developed an extensive area of tender erythema across his anterior medial thigh. He took to doxycycline yesterday that he had leftover from a previous prescription. The patient complains of weeping edema coming out of his toes 08/08/16; I saw this patient on 10/2. He was tender across his anterior thigh. I put him on doxycycline. He returns today in follow-up. He does not have any open wounds on his lower leg, he still has edema weeping into his toes. 08/12/16; patient was seen back urgently today to follow-up for his extensive left thigh  cellulitis/erysipelas. He comes back with a lot less swelling and erythema pain is much better. I believe I gave him Augmentin and Cipro. His wrap was cut down as he stated a roll down his legs. He developed blistering above the level of the wrap that remained. He has 2 open blisters and 1 intact. 08/19/16; patient is been doing his primary doctor who is increased his Lasix from 40-80 once a day or 80 already has less edema. Cellulitis has remained improved in the left thigh. 2 open areas on the posterior left calf 08/26/16; he returns today having new open blisters on the anterior part of his left leg. He has his compression pumps but is not yet been shown how to use some vital representative from the supplier. 09/02/16 patient returns today with no open wounds on the left leg. Some maceration in his plantar toes 09/10/2016 -- Dr. Leanord Hawking had recently discharged him on 09/02/2016 and he has come right back with redness swelling and some open ulcers on his left lower extremity. He says this was caused by trying to apply his compression stockings and he's been unable to use this and has not been able to use his lymphedema pumps. He had some doxycycline leftover and he has started on this a few days ago. 09/16/16; there are no open wounds on his leg on the left and no evidence of cellulitis. 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 He does not complain of symptoms here. I am not clear use using his external compression pumps. 09/23/16; I have not seen this patient in 2 weeks. He canceled his appointment 10 days ago as he was going on vacation. He tells me that on Monday he noticed a large area on his posterior left leg which is been draining copiously and is reopened into a large wound. He is been using ABDs and the external part of his juxtalite, according to our nurse this was not on properly. 10/07/16; Still a substantial area on the posterior  left leg. Using silver alginate 10/14/16; in general better although there is still open area which looks healthy. Still using silver alginate. He reminds me that this happen before he left for [facility]. T oday while he  was showering in the morning. He had been using his juxtalite's 10/21/16; the area on his posterior left leg is fully epithelialized. However he arrives today with a large area of tender erythema in his medial and posterior left thigh just above the knee. I have marked the area. Once again he is reluctant to consider hospitalization. I treated him with oral antibiotics in the past for a similar situation with resolution I think with doxycycline however this area it seems more extensive to me. He is not complaining of fever but does have chills and says states he is thirsty. His blood sugar today was in the 140s at home 10/25/16 the area on his posterior left leg is fully epithelialized although there is still some weeping edema. The large area of tenderness and erythema in his medial and posterior left thigh is a lot less tender although there is still a lot of swelling in this thigh. He states he feels a lot better. He is on doxycycline and Augmentin that I started last week. This will continued until Tuesday, December 26. I have ordered a duplex ultrasound of the left thigh rule out DVT whether there is an abscess something that would need to be drained I would also like to know. 11/01/16; he still has weeping edema from a not fully epithelialized area on his left posterior calf. Most of the rest of this looks a lot better. He has completed his antibiotics. His thigh is a lot better. Duplex ultrasound did not show a DVT in the thigh 11/08/16; he comes in today with more Denuded surface epithelium from the posterior aspect of his calf. There is no real evidence of cellulitis. The superior aspect of his wrap appears to have put quite an indentation in his leg just below the knee and  this may have contributed. He does not complain of pain or fever. We have been using silver alginate as the primary dressing. The area of cellulitis in the right thigh has totally resolved. He has been using his compression stockings once a week 11/15/16; the patient arrives today with more loss of epithelium from the posterior aspect of his left calf. He now has a fairly substantial wound in this area. The reason behind this deterioration isn't exactly clear although his edema is not well controlled. He states he feels he is generally more swollen systemically. He is not complaining of chest pain shortness of breath fever. T me he has an appointment with his primary physician in early February. He is on 80 mg of oral ells Lasix a day. He claims compliance with the external compression pumps. He is not having any pain in his legs similar to what he has with his recurrent cellulitis 11/22/16; the patient arrives a follow-up of his large area on his left lateral calf. This looks somewhat better today. He came in earlier in the week for a dressing change since I saw him a week ago. He is not complaining of any pain no shortness of breath no chest pain 11/28/16; the patient arrives for follow-up of his large area on the left lateral calf this does not look better. In fact it is larger weeping edema. The surface of the wound does not look too bad. We have been using silver alginate although I'm not certain that this is a dressing issue. 12/05/16; again the patient follows up for a large wound on the left lateral and left posterior calf this does not look better. There continues to be weeping edema  necrotic surface tissue. More worrisome than this once again there is erythema below the wound involving the distal Achilles and heel suggestive of cellulitis. He is on his feet working most of the day of this is not going well. We are changing his dressing twice a week to facilitate the drainage. 12/12/16; not much  change in the overall dimensions of the large area on the left posterior calf. This is very inflamed looking. I gave him an. Doxycycline last week does not really seem to have helped. He found the wrap very painful indeed it seems to of dog into his legs superiorly and perhaps around the heel. He came in early today because the drainage had soaked through his dressings. 12/19/16- patient arrives for follow-up evaluation of his left lower extremity ulcers. He states that he is using his lymphedema pumps once daily when there is "no drainage". He admits to not using his lipedema pumps while under current treatment. His blood sugars have been consistently between 150-200. 12/26/16; the patient is not using his compression pumps at home because of the wetness on his feet. I've advised him that I think it's important for him to use this daily. He finds his feet too wet, he can put a plastic bag over his legs while he is in the pumps. Otherwise I think will be in a vicious circle. We are using silver alginate to the major area on his left posterior calf 01/02/17; the patient's posterior left leg has further of all into 3 open wounds. All of them covered with a necrotic surface. He claims to be using his compression pumps once a day. His edema control is marginal. Continue with silver alginate 01/10/17; the patient's left posterior leg actually looks somewhat better. There is less edema, less erythema. Still has 3 open areas covered with a necrotic surface requiring debridement. He claims to be using his compression pumps once a day his edema control is better 01/17/17; the patient's left posterior calf look better last week when I saw him and his wrap was changed 2 days ago. He has noted increasing pain in the left heel and arrives today with much larger wounds extensive erythema extending down into the entire heel area especially tender medially. He is not systemically unwell CBGs have been controlled no fever.  Our intake nurse showed me limegreen drainage on his AVD pads. 01/24/17; his usual this patient responds nicely to antibiotics last week giving him Levaquin for presumed Pseudomonas. The whole entire posterior part of his leg is much better much less inflamed and in the case of his Achilles heel area much less tender. He has also had some epithelialization posteriorly there are still open areas here and still draining but overall considerably better 01/31/17- He has continue to tolerate the compression wraps. he states that he continues to use the lymphedema pumps daily, and can increase to twice daily on the weekends. He is voicing no complaints or concerns regarding his LLE ulcers 02/07/17-he is here for follow-up evaluation. He states that he noted some erythema to the left medial and anterior thigh, which he states is new as of yesterday. He is concerned about recurrent cellulitis. He states his blood sugars have been slightly elevated, this morning in the 180s 02/14/17; he is here for follow-up evaluation. When he was last here there was erythema superiorly from his posterior wound in his anterior thigh. He was prescribed Levaquin however a culture of the wound surface grew MRSA over the phone I changed him  to doxycycline on Monday and things seem to be a lot better. 02/24/17; patient missed his appointment on Friday therefore we changed his nurse visit into a physician visit today. Still using silver alginate on the large area of the posterior left thigh. He isn't new area on the dorsal left second toe 03/03/17; actually better today although he admits he has not used his external compression pumps in the last 2 days or so because of work responsibilities over the weekend. 03/10/17; continued improvement. External compression pumps once a day almost all of his wounds have closed on the posterior left calf. Better edema control 03/17/17; in general improved. He still has 3 small open areas on the lateral  aspect of his left leg however most of the area on the posterior part of his leg is epithelialized. He has better edema control. He has an ABD pad under his stocking on the right anterior lower leg although he did not let us look at that today. 03/24/17; patient arrives back in clinic today with no open areas however there are areas on the posterior left calf and anterior left calf that are less than 100% epithelialized. His edema is well controlled in the left lower leg. There is some pitting edema probably lymphedema in the left upper thigh. He uses compression pumps at home once per day. I tried to get him to do this twice a day although he is very reticent. 04/01/2017 -- for the last 2 days he's had significant redness, tenderness and weeping and came in for an urgent visit today. 04/07/17; patient still has 6 more days of doxycycline. He was seen by Dr. Meyer Russel last Wednesday for cellulitis involving the posterior aspect, lateral aspect of his Involving his heel. For the most part he is better there is less erythema and less weeping. He has been on his feet for 12 hours 2 over the weekend. Using his compression pumps once a day 04/14/17 arrives today with continued improvement. Only one area on the posterior left calf that is not fully epithelialized. He has intense bilateral venous inflammation associated with his chronic venous insufficiency disease and secondary lymphedema. We have been using silver alginate to the left posterior calf wound In passing he tells [place] today that the right leg but we have not seen in quite some time has an open area on it but he doesn't want [place] to look at this today states he will show this to [place] next week. 04/21/17; there is no open area on his left leg although he still reports some weeping edema. He showed [place] his right leg today which is the first time we've seen this leg in a long time. He has a large area of open wound on the right leg anteriorly healthy granulation.  Quite a bit of swelling in the right leg and some degree of venous inflammation. He told us about the right leg in passing last week but states that deterioration in the right leg really only happened over the weekend 04/28/17; there is no open area on the left leg although there is an irritated part on the posterior which is like a wrap injury. The wound on the right leg which was new from last week at least to [place] is a lot better. 05/05/17; still no open area on the left leg. Patient is using his new compression stocking which seems to be doing a good job of controlling the edema. He states he is using his compression pumps once per day.  The right leg still has an open wound although it is better in terms of surface area. Required debridement. A lot of pain in the posterior right Achilles marked tenderness. Usually this type of presentation this patient gives concern for an active cellulitis 05/12/17; patient arrives today with his major wound from last week on the right lateral leg somewhat better. Still requiring debridement. He was using his compression stocking on the left leg however that is reopened with superficial wounds anteriorly he did not have an open wound on this leg previously. He is still using his juxta light's once daily at night. He cannot find the time to do this in the morning as he has to be at work by 7 AM 05/19/17; right lateral leg wound looks improved. No debridement required. The concerning area is on the left posterior leg which appears to almost have a subcutaneous hemorrhagic component to it. We've been using silver alginate to all the wounds 05/26/17; the right lateral leg wound continues to look improved. However the area on the left posterior calf is a tightly adherent surface. [place] using silver alginate. Because of the weeping edema in his legs there is very little good alternatives. 06/02/17; the patient left here last week looking quite good. Major wound on the left  posterior calf and a small one on the right lateral calf. Both of these look satisfactory. He tells me that by Wednesday he had noted increased pain in the left leg and drainage. He called on Thursday and Friday to get an appointment here but we were blocked. He did not go to urgent care or his primary physician. He thinks he had a fever on Thursday but did not actually take his temperature. He has not been using his compression pumps on the left leg because of pain. I advised him to go to the emergency room today for IV antibiotics for stents of left leg cellulitis but he has refused I have asked him to take 2 days off work to keep his leg elevated and he has refused this as well. In view of this I'm going to call him and Augmentin and doxycycline. 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 However after a detailed discussion regarding the risk benefits and alternatives he refuses to get admitted to the hospital. With no other recourse I will continue him on oral antibiotics as before and hopefully he'll have his infectious disease consultation this week. 06/16/2017 -- the patient was seen today by the nurse practitioner at infectious disease Ms. Dixon. 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 He has also been advise Lotrimin cream locally. she also had wise IV antibiotics for his condition if it flares up 06/23/17; patient arrives today with drainage bilaterally although the remaining wound on the left posterior calf after cleaning up today "highlighter yellow drainage" did not  look too bad. Unfortunately he has had breakdown on the right anterior leg [previously this leg had not been open and he is using a black stocking] he went to see infectious disease and is been put  on clindamycin 150 mg daily, I did not verify the dose although I'm not familiar with using clindamycin in this dosing range, perhaps for prophylaxisoo 06/27/17; I brought this patient back today to follow-up on the wound deterioration on the right lower leg together with surrounding cellulitis. I started him on doxycycline 4 days ago. This area looks better however he comes in today with intense cellulitis on the medial part of his left thigh. This is not have a wound in this area. Extremely tender. We've been using silver alginate to the wounds on the right lower leg left lower leg with bilateral 4 layer compression he is using his external compression pumps once a day 07/04/17; patient's left medial thigh cellulitis looks better. He has not been using his compression pumps as his insert said it was contraindicated with cellulitis. His right leg continues to make improvements all the wounds are still open. We only have one remaining wound on the left posterior calf. Using silver alginate to all open areas. He is on doxycycline which I started a week ago and should be finishing I gave him Augmentin after Thursday's visit for the severe cellulitis on the left medial thigh which fortunately looks better 07/14/17; the patient's left medial thigh cellulitis has resolved. The cellulitis in his right lower calf on the right also looks better. All of his wounds are stable to improved we've been using silver alginate he has completed the antibiotics I have given him. He has clindamycin 150 mg once a day prescribed by infectious disease for prophylaxis, I've advised him to start this now. We have been using bilateral Unna boots over silver alginate to the wound areas 07/21/17; the patient is been to see infectious  disease who noted his recurrent problems with cellulitis. He was not able to tolerate prophylactic clindamycin therefore he is on amoxicillin 500 twice a day. He also had a second daily dose of Lasix added By Dr. Oneta Rack but he is not taking this. Nor is he being completely compliant with his compression pumps a especially not this week. He has 2 remaining wounds one on the right posterior lateral lower leg and one on the left posterior medial lower leg. 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 The patient has Unna boots bilaterally. 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 He has bilateral Unna boots for compression and silver alginate to his wounds. Arrives today with his legs looking as good as I have seen him in quite some time. Not surprisingly his wounds look better as well with improvement on the right lateral leg venous insufficiency wound and also the left medial leg. He is still using the compression pumps once a day 08/11/17; both legs appear to be doing better wounds on the right lateral and left medial legs look better. Skin on the right leg quite good. He is been using silver alginate as the primary dressing. I'm going to use Anasept gel calcium alginate and maintain all the secondary dressings 08/18/17; the patient continues to actually do quite well. The area on his right lateral leg is just about closed the left medial also looks better although it is still moist in this area. His edema is well controlled we have been using Anasept gel with calcium alginate and the usual secondary dressings,  4 layer compression and once daily use of his compression pumps "always been able to manage 09/01/17; the patient continues to do reasonably well in spite of his trip [place]. The area on the right lateral leg is epithelialized. Left is much better but still open. He has more edema and more chronic erythema on the left leg [venous inflammation] 09/08/17; he arrives today with no open wound on the right lateral leg and decently controlled edema. Unfortunately his left leg is not nearly as in his good situation as last week.he apparently had increasing edema starting on Saturday. He edema soaked through into his foot so used a plastic bag to walk around his home. The area on the medial right leg which was his open area is about the same however he has lost surface epithelium on the left lateral which is new and he has significant pain in the Achilles area of the left foot. He is already on amoxicillin chronically for prophylaxis of cellulitis in the left leg 09/15/17; he is completed a week of doxycycline and the cellulitis in the left posterior leg and Achilles area is as usual improved. He still has a lot of edema and fluid soaking through his dressings. There is no open wound on the right leg. He saw infectious disease NP today 09/22/17;As usual 1 we transition him from our compression wraps to his stockings things did not go well. He has several small open areas on the right leg. He states this was caused by the compression wrap on his skin although he did not wear this with the stockings over them. He has several superficial areas on the left leg medially laterally posteriorly. He does not have any evidence of active cellulitis especially involving the left Achilles The patient is traveling from [facility] Saturday going to [facility]. He states he isn't attempting to get an appointment with a heel objects wound center there to change his dressings. I am not completely certain whether this will work 10/06/17; the patient came in on Friday for a nurse visit and the nurse reported that his legs actually look quite good. He arrives in clinic today for his  regular follow-up visit. He has a new wound on his left third toe over the PIP probably caused by friction with his footwear. He has small areas on the left leg and a very superficial but epithelialized area on the right anterior lateral lower leg. Other than that his legs look as good as I've seen him in quite some time. We have been using silver alginate Review of systems; no chest pain no shortness of breath other than this a 10 point review of systems negative 10/20/17; seen by Dr. Meyer Russel last week. He had taken some antibiotics [doxycycline] that he had left over. Dr. Meyer Russel thought he had candida infection and declined to give him further antibiotics. He has a small wound remaining on the right lateral leg several areas on the left leg including a larger area on the left posterior several left medial and anterior and a small wound on the left lateral. The area on the left dorsal third toe looks a lot better. ROS; Gen.; no fever, respiratory no cough no sputum Cardiac no chest pain other than this 10 point review of system is negative 10/30/17; patient arrives today having fallen in the bathtub 3 days ago. It took him a while to get up. He has pain and maceration in the wounds on his left leg which have deteriorated.  He has not been using his pumps he also has some maceration on the right lateral leg. 11/03/17; patient continues to have weeping edema especially in the left leg. This saturates his dressings which were just put on on 12/27. As usual the doxycycline seems to take care of the cellulitis on his lower leg. He is not complaining of fever, chills, or other systemic symptoms. He states his leg feels a lot better on the doxycycline I gave him empirically. He also apparently gets injections at his primary doctor's officeo Rocephin for cellulitis prophylaxis. I didn't ask him about his compression pump compliance today I think that's probably marginal. Arrives in the clinic with all of his  dressings primary and secondary macerated full of fluid and he has bilateral edema 11/10/17; the patient's right leg looks some better although there is still a cluster of wounds on the right lateral. The left leg is inflamed with almost circumferential skin loss medially to laterally although we are still maintaining anteriorly. He does not have overt cellulitis there is a lot of drainage. He is not using compression pumps. We have been using silver alginate to the wound areas, there are not a lot of options here 11/17/17; the patient's right leg continues to be stable although there is still open wounds, better than last week. The inflammation in the left leg is better. Still loss of surface layer epithelium especially posteriorly. There is no overt cellulitis in the amount of edema and his left leg is really quite good, tells me he is using his compression pumps once a day. 11/24/17; patient's right leg has a small superficial wound laterally this continues to improve. The inflammation in the left leg is still improving however we have continuous surface layer epithelial loss posteriorly. There is no overt cellulitis in the amount of edema in both legs is really quite good. He states he is using his compression pumps on the left leg once a day for 5 out of 7 days 12/01/17; very small superficial areas on the right lateral leg continue to improve. Edema control in both legs is better today. He has continued loss of surface epithelialization and left posterior calf although I think this is better. We have been using silver alginate with large number of absorptive secondary dressings 4 layer on the left Unna boot on the right at his request. He tells me he is using his compression pumps once a day 12/08/17; he has no open area on the right leg is edema control is good here. On the left leg however he has marked erythema and tenderness breakdown of skin. He has what appears to be a wrap injury just distal to  the popliteal fossa. 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 There was swelling erythema and breakdown in the right leg. He has much worse skin breakdown in the left leg as well multiple open areas medially and posteriorly as well as laterally. He tells me he has been using his compression pumps but tells me he feels that the drainage out of his leg is worse when he uses a compression pumps. T be fair to him he is been saying this o for a while however I don't know that I have  really been listening to this. I wonder if the compression pumps are working properly 12/22/17;. Once again he arrives with severe erythema, weeping edema from the left greater than right leg. Noncompliance with compression pumps. New this visit he is complaining of pain on the lateral aspect of the right leg and the medial aspect of his right thigh. He apparently saw his cardiologist Dr. [name] who was ordered an echocardiogram area and I think this is a step in the right direction 12/25/17; started his doxycycline Monday night. There is still intense erythema of the right leg especially in the anterior thigh although there is less tenderness. The erythema around the wound on the right lateral calf also is less tender. He still complaining of pain in the left heel. His wounds are about the same right lateral left medial left lateral. Superficial but certainly not close to closure. He denies being systemically unwell no fever chills no abdominal pain no diarrhea 12/29/17; back in follow-up of his extensive right calf and right thigh cellulitis. I added amoxicillin to cover possible doxycycline resistant strep. This seems to of done the trick he is in much  less pain there is much less erythema and swelling. He has his echocardiogram at 11:00 this morning. X-ray of the left heel was also negative. 01/05/18; the patient arrived with his edema under much better control. Now that he is retired he is able to use his compression pumps daily and sometimes twice a day per the patient. He has a wound on the right leg the lateral wound looks better. Area on the left leg also looks a lot better. He has no evidence of cellulitis in his bilateral thighs I had a quick peak at his echocardiogram. He is in normal ejection fraction and normal left ventricular function. He has moderate pulmonary hypertension moderately reduced right ventricular function. One would have to wonder about chronic sleep apnea although he says he doesn't snore. He'll review the echocardiogram with his cardiologist. 01/12/18; the patient arrives with the edema in both legs under exemplary control. He is using his compression pumps daily and sometimes twice daily. His wound on the right lateral leg is just about closed. He still has some weeping areas on the posterior left calf and lateral left calf although everything is just about closed here as well. I have spoken with Aldean Baker who is the patient's nurse practitioner and infectious disease. She was concerned that the patient had not understood that the parenteral penicillin injections he was receiving for cellulitis prophylaxis was actually benefiting him. I don't think the patient actually saw that I would tend to agree we were certainly dealing with less infections although he had a serious one last month. 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 He is healed. He'll be placed in juxtalite compression wraps and increase his lymphedema pumps to twice daily. We will follow up again next week to ensure there are no issues with the new regiment. 01/20/18-he is here for evaluation of bilateral lower  extremity weeping edema. Yesterday he was placed in compression wrap to the right lower extremity and compression stocking to left lower shrubbery. He states he uses lymphedema pumps last night and again this morning and noted a blister to the left lower extremity. On exam he was noted to have drainage to the right lower extremity. He will be placed in Unna boots bilaterally and follow-up next week 01/26/18; patient was actually discharged a week ago to his own  juxta light stockings only to return the next day with bilateral lower extremity weeping edema.he was placed in bilateral Unna boots. He arrives today with pain in the back of his left leg. There is no open area on the right leg however there is a linear/wrap injury on the left leg and weeping edema on the left leg posteriorly. I spoke with infectious disease about 10 days ago. They were disappointed that the patient elected to discontinue prophylactic intramuscular penicillin shots as they felt it was particularly beneficial in reducing the frequency of his cellulitis. I discussed this with the patient today. He does not share this view. He'll definitely need antibiotics today. Finally he is traveling to [place] and trauma leaving this Saturday and returning a week later and he does not travel with his pumps. He is going by car 01/30/18; patient was seen 4 days ago and brought back in today for review of cellulitis in the left leg posteriorly. I put him on amoxicillin this really hasn't helped as much as I might like. He is also worried because he is traveling to [facility] trauma by car. Finally we will be rewrapping him. There is no open area on the right leg over his left leg has multiple weeping areas as usual 02/09/18; The same wrap on for 10 days. He did not pick up the last doxycycline I prescribed for him. He apparently took 4 days worth he already had. There is nothing open on his right leg and the edema control is really quite good. He's  had damage in the left leg medially and laterally especially probably related to the prolonged use of Unna boots 02/12/18; the patient arrived in clinic today for a nurse visit/wrap change. He complained of a lot of pain in the left posterior calf. He is taking doxycycline that I previously prescribed for him. Unfortunately even though he used his stockings and apparently used to compression pumps twice a day he has weeping edema coming out of the lateral part of his right leg. This is coming from the lower anterior lateral skin area. 02/16/18; the patient has finished his doxycycline and will finish the amoxicillin 2 days. The area of cellulitis in the left calf posteriorly has resolved. He is no longer having any pain. He tells me he is using his compression pumps at least once a day sometimes twice. 02/23/18; the patient finished his doxycycline and Amoxil last week. On Friday he noticed a small erythematous circle about the size of a quarter on the left lower leg just above his ankle. This rapidly expanded and he now has erythema on the lateral and posterior part of the thigh. This is bright red. Also has an area on the dorsal foot just above his toes and a tender area just below the left popliteal fossa. He came off his prophylactic penicillin injections at his own insistence one or 2 months ago. This is obviously deteriorated since then 03/02/18; patient is on doxycycline and Amoxil. Culture I did last week of the weeping area on the back of his left calf grew group B strep. I have therefore renewed the amoxicillin 500 3 times a day for a further week. He has not been systemically unwell. Still complaining of an area of discomfort right under his left popliteal fossa. There is no open wound on the right leg. He tells me that he is using his pumps twice a day on most days 03/09/18; patient arrives in clinic today completing his amoxicillin today. The cellulitis  on his left leg is better. Furthermore he  tells me that he had intramuscular penicillin shots that his primary care office today. However he also states that the wrap on his right leg fell down shortly after leaving clinic last week. He developed a large blister that was present when he came in for a nurse visit later in the week and then he developed intense discomfort around this area.He tells me he is using his compression pumps 03/16/18; the patient has completed his doxycycline. The infectious part of this/cellulitis in the left heel area left popliteal area is a lot better. He has 2 open areas on the right calf. Still areas on the left calf but this is a lot better as well. 03/24/18; the patient arrives complaining of pain in the left popliteal area again. He thinks some of this is wrap injury. He has no open area on the right leg and really no open area on the left calf either except for the popliteal area. He claims to be compliant with the compression pumps 03/31/18; I gave him doxycycline last week because of cellulitis in the left popliteal area. This is a lot better although the surface epithelium is denuded off and response to this. He arrives today with uncontrolled edema in the right calf area as well as a fingernail injury in the right lateral calf. There is only a few open areas on the left 04/06/18; I gave him amoxicillin doxycycline over the last 2 weeks that the amoxicillin should be completing currently. He is not complaining of any pain or systemic symptoms. The only open areas see has is on the right lateral lower leg paradoxically I cannot see anything on the left lower leg. He tells me he is using his compression pumps twice a day on most days. Silver alginate to the wounds that are open under 4 layer compression 04/13/18; he completed antibiotics and has no new complaints. Using his compression pumps. Silver alginate that anything that's opened 04/20/18; he is using his compression pumps religiously. Silver alginate 4 layer  compression anything that's opened. He comes in today with no open wounds on the left leg but 3 on the right including a new one posteriorly. He has 2 on the right lateral and one on the right posterior. He likes Unna boots on the right leg for reasons that aren't really clear we had the usual 4 layer compression on the left. It may be necessary to move to the 4 layer compression on the right however for now I left them in the Unna boots 04/27/18; he is using his compression pumps at least once a day. He has still the wounds on the right lateral calf. The area right posteriorly has closed. He does not have an open wound on the left under 4 layer compression however on the dorsal left foot just proximal to the toes and the left third toe 2 small open areas were identified 05/11/18; he has not uses compression pumps. The areas on the right lateral calf have coalesced into one large wound necrotic surface. On the left side he has one small wound anteriorly however the edema is now weeping out of a large part of his left leg. He says he wasn't using his pumps because of the weeping fluid. I explained to him that this is the time he needs to pump more 05/18/18; patient states he is using his compression pumps twice a day. The area on the right lateral large wound albeit superficial. On  the left side he has innumerable number of small new wounds on the left calf particularly laterally but several anteriorly and medially. All these appear to have healthy granulated base these look like the remnants of blisters however they occurred under compression. The patient arrives in clinic today with his legs somewhat better. There is certainly less edema, less multiple open areas on the left calf and the right anterior leg looks somewhat better as well superficial and a little smaller. However he relates pain and erythema over the last 3-4 days in the thigh and I looked at this today. He has not been systemically unwell  no fever no chills no change in blood sugar values 05/25/18; comes in today in a better state. The severe cellulitis on his left leg seems better with the Keflex. Not as tender. He has not been systemically unwell Hard to find an open wound on the left lower leg using his compression pumps twice a day The confluent wounds on his right lateral calf somewhat better looking. These will ultimately need debridement I didn't do this today. 06/01/18; the severe cellulitis on the left anterior thigh has resolved and he is completed his Keflex. There is no open wound on the left leg however there is a superficial excoriation at the base of the third toe dorsally. Skin on the bottom of his left foot is macerated looking. The left the wounds on the lateral right leg actually looks some better although he did require debridement of the top half of this wound area with an open curet 06/09/18 on evaluation today patient appears to be doing poorly in regard to his right lower extremity in particular this appears to likely be infected he has very thick purulent discharge along with a bright green tent to the discharge. This makes me concerned about the possibility of pseudomonas. He's also having increased discomfort at this point on evaluation. Fortunately there does not appear to be any evidence of infection spreading to the other location at this time. 06/16/18 on evaluation today patient appears to actually be doing fairly well. His ulcer has actually diminished in size quite significantly at this point which is good news. Nonetheless he still does have some evidence of infection he did see infectious disease this morning before coming here for his appointment. I did review the results of their evaluation and their note today. They did actually have him discontinue the Cipro and initiate treatment with linezolid at this time. He is doing this for the next seven days and they recommended a follow-up in four months with  them. He is the keep a log of the need for intermittent antibiotic therapy between now and when he falls back up with infectious disease. This will help them gaze what exactly they need to do to try and help them out. 06/23/18; the patient arrives today with no open wounds on the left leg and left third toe healed. He is been using his compression pumps twice a day. On the right lateral leg he still has a sizable wound but this is a lot better than last time I saw this. 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 Has been using silver alginate under 4 layer compression 06/30/18; the only open wound he has is on the right lateral leg and this looks healthy. No debridement is required. We have been using silver alginate. He does not have  an open wound on the left leg. There is apparently some drainage from the dorsal proximal third toe on the left although I see no open wound here. 07/03/18 on evaluation today patient was actually here just for a nurse visit rapid change. However when he was here on Wednesday for his rat change due to having been healed on the left and then developing blisters we initiated the wrap again knowing that he would be back today for [place] to reevaluate and see were at. Unfortunately he has developed some cellulitis into the proximal portion of his right lower extremity even into the region of his thigh. 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 He was placed on one as what at that point. Nonetheless he is done with that and has been tolerating it well otherwise. Doxycycline which in the past really did not seem to be effective for him. Nonetheless I think the best option may be for [place] to definitely reinitiate the antibiotics for a longer period of time. 07/07/18; since I last saw this patient a week ago he has had a difficult time. At that  point he did not have an open wound on his left leg. We transitioned him into juxta light stockings. He was apparently in the clinic the next day with blisters on the left lateral and left medial lower calf. He also had weeping edema fluid. He was put back into a compression wrap. He was also in the clinic on Friday with intense erythema in his right thigh. Per the patient he was started on Bactrim however that didn't work at all in terms of relieving his pain and swelling. He has taken 3 doxycycline that he had left over from last time and that seems to of helped. He has blistering on the right thigh as well. 07/14/18; the erythema on his right thigh has gotten better with doxycycline that he is finishing. 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 Presumably this cellulitis in the thigh was not related to that although I think this is a bit concerning going forward. He still has an area on the right lateral calf the blister on the right medial calf just below the knee that was discussed above. On the left 2 small open areas left medial and left lateral. Edema control is adequate. He is using his compression pumps twice a day 07/20/18; continued improvement in the condition of both legs especially the edema in his bilateral thighs. He tells me he is been losing weight through a combination of diet and exercise. He is using his compression pumps twice a day. So overall she made to the remaining wounds 07/27/2018; continued improvement in condition of both legs. His edema is well controlled. The area on the right lateral leg is just about closed he had one blisters show up on the medial left upper calf. We have him in 4 layer compression. He is going on a 10-day trip to [place], [place] and [place]. He will be driving. He wants to wear Unna boots because of the lessening amount of constriction. He will not use compression  pumps while he is away 08/05/18 on evaluation today patient actually appears to be doing decently well all things considered in regard to his bilateral lower extremities. The worst ulcer is actually only posterior aspect of his left lower extremity with a four layer  compression wrap cut into his leg a couple weeks back. He did have a trip and actually had [employer] for the trip that he is worn since he was last here. Nonetheless he feels like the [employer] actually do better for him his swelling is up a little bit but he also with his trip was not taking his Lasix on a regular set schedule like he was supposed to be. He states that obviously the reason being that he cannot drive and keep going without having to urinate too frequently which makes it difficult. He did not have his pumps with him while he was away either which I think also maybe playing a role here too. 08/13/2018; the patient only has a small open wound on the right lateral calf which is a big improvement in the last month or 2. He also has the area posteriorly just below the posterior fossa on the left which I think was a wrap injury from several weeks ago. He has no current evidence of cellulitis. He tells me he is back into his compression pumps twice a day. He also tells me that while he was at the laundromat somebody stole a section of his extremitease stockings 08/20/2018; back in the clinic with a much improved state. He only has small areas on the right lateral mid calf which is just about healed. This was is more substantial area for quite a prolonged period of time. He has a small open area on the left anterior tibia. The area on the posterior calf just below the popliteal fossa is closed today. He is using his compression pumps twice a day 08/28/2018; patient has no open wound on the right leg. He has a smattering of open areas on the calf with some weeping lymphedema. More problematically than that it looks as  though his wraps of slipped down in his usual he has very angry upper area of edema just below the right medial knee and on the right lateral calf. He has no open area on his feet. The patient is traveling to [facility] next week. I will send him in an antibiotic. We will continue to wrap the right leg. We ordered extremitease stockings for him last week and I plan to transition the right leg to a stocking when he gets home which will be in 10 days time. As usual he is very reluctant to take his pumps with him when he travels 09/07/2018; patient returns from [facility]. He shows me a picture of his left leg in the mid part of his trip last week with intense fire engine erythema. The picture look bad enough I would have considered sending him to the hospital. Instead he went to the wound care center in [facility]. They did not prescribe him antibiotics but he did take some doxycycline he had leftover from a previous visit. I had given him trimethoprim sulfamethoxazole before he left this did not work according to the patient. This is resulted in some improvement fortunately. He comes back with a large wound on the left posterior calf. Smaller area on the left anterior tibia. Denuded blisters on the dorsal left foot over his toes. Does not have much in the way of wounds on the right leg although he does have a very tender area on the right posterior area just below the popliteal fossa also suggestive of infection. He promises me he is back on his pumps twice a day 09/15/2018; the intense cellulitis  in his left lower calf is a lot better. The wound area on the posterior left calf is also so better. However he has reasonably extensive wounds on the dorsal aspect of his second and third toes and the proximal foot just at the base of the toes. There is nothing open on the right leg 09/22/2018; the patient has excellent edema control in his legs bilaterally. He is using his external compression  pumps twice a day. He has no open area on the right leg and only the areas in the left foot dorsally second and third toe area on the left side. He does not have any signs of active cellulitis. 10/06/2018; the patient has good edema control bilaterally. He has no open wound on the right leg. There is a blister in the posterior aspect of his left calf that we had to deal with today. He is using his compression pumps twice a day. There is no signs of active cellulitis. We have been using silver alginate to the wound areas. He still has vulnerable areas on the base of his left first second toes dorsally He has a his extremities stockings and we are going to transition him today into the stocking on the right leg. He is cautioned that he will need to continue to use the compression pumps twice a day. If he notices uncontrolled edema in the right leg he may need to go to 3 times a day. 10/13/2018; the patient came in for a nurse check on Friday he has a large flaccid blister on the right medial calf just below the knee. We unroofed this. He has this and a new area underneath the posterior mid calf which was undoubtedly a blister as well. He also has several small areas on the right which is the area we put his extremities stocking on. 10/19/2018; the patient went to see infectious disease this morning I am not sure if that was a routine follow-up in any case the doxycycline I had given him was discontinued and started on linezolid. He has not started this. It is easy to look at his left calf and the inflammation and think this is cellulitis however he is very tender in the tissue just below the popliteal fossa and I have no doubt that there is infection going on here. He states the problem he is having is that with the compression pumps the edema goes down and then starts walking the wrap falls down. We will see if we can adhere this. He has 1 or 2 minuscule open areas on the right still areas that are  weeping on the posterior left calf, the base of his left second and third toes 10/26/18; back today in clinic with quite of skin breakdown in his left anterior leg. This may have been infection the area below the popliteal fossa seems a lot better however tremendous epithelial loss on the left anterior mid tibia area over quite inexpensive tissue. He has 2 blisters on the right side but no other open wound here. 10/29/2018; came in urgently to see [place] today and we worked him in for review. He states that the 4 layer compression on the right leg caused pain he had to cut it down to roughly his mid calf this caused swelling above the wrap and he has blisters and skin breakdown today. As a result of the pain he has not been using his pumps. Both legs are a lot more edematous and there is a lot of weeping  fluid. 11/02/18; arrives in clinic with continued difficulties in the right leg> left. Leg is swollen and painful. multiple skin blisters and new open areas especially laterally. He has not been using his pumps on the right leg. 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 He is not systemically unwell. 11/09/2018; the patient claims he is being compliant with his pumps. He is finished the doxycycline I gave him last week. Culture I did of the wound on the right lateral leg showed a few very resistant methicillin staph aureus. This was resistant to doxycycline. Nevertheless he states the pain in the leg is a lot better which makes me wonder if the cultured organism was not really what was causing the problem nevertheless this is a very dangerous organism to be culturing out of any wound. His right leg is still a lot larger than the left. He is using an Radio broadcast assistant on this area, he blames a 4-layer compression for causing the original skin breakdown which I doubt is true however I cannot talk him out of it. We have been using silver alginate to all of these areas  which were initially blisters 11/16/2018; patient is being compliant with his external compression pumps at twice a day. Miraculously he arrives in clinic today with absolutely no open wounds. He has better edema control on the left where he has been using 4 layer compression versus wound of wounds on the right and I pointed this out to him. There is no inflammation in the skin in his lower legs which is also somewhat unusual for him. There is no open wounds on the dorsal left foot. He has extremitease stockings at home and I have asked him to bring these in next week. 11/25/18 patient's lower extremity on examination today on the left appears for the most part to be wound free. He does have an open wound on the lateral aspect of the right lower extremity but this is minimal compared to what I've seen in past. He does request that we go ahead and wrap the left leg as well even though there's nothing open just so hopefully it will not reopen in short order. 1/28; patient has superficial open wounds on the right lateral calf left anterior calf and left posterior calf. His edema control is adequate. 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 We have been using silver alginate as the primary dressing. He claims compliance with his compression pumps 2/4; patient has superficial open wounds on numerous areas of his left calf and again one on the left dorsal foot. The areas on the right lateral calf have healed. The cellulitis that I gave him doxycycline for last week is also resolved this was mostly on the left anterior calf just below the tibial tuberosity. His edema looks fairly well-controlled. He tells me he went to see his primary doctor today and had blood work ordered 2/11; once again he has several open areas on the left calf left tibial area. Most of these are small and appear to have healthy granulation. He does not  have anything open on the right. The edema and control in his thighs is pretty good which is usually a good indication he has been using his pumps as requested. 2/18; he continues to have several small areas on the left calf and left tibial area. Most of these are small healthy granulation.  We put him in his stocking on the right leg last week and he arrives with a superficial open area over the right upper tibia and a fairly large area on the right lateral tibia in similar condition. His edema control actually does not look too bad, he claims to be using his compression pumps twice a day 2/25. Continued small areas on the left calf and left tibial area. New areas especially on the right are identified just below the tibial tuberosity and on the right upper tibia itself. There are also areas of weeping edema fluid even without an obvious wound. He does not have a considerable degree of lymphedema but clearly there is more edema here than his skin can handle. He states he is using the pumps twice a day. We have an Unna boot on the right and 4 layer compression on the left. 3/3; he continues to have an area on the right lateral calf and right posterior calf just below the popliteal fossa. There is a fair amount of tenderness around the wound on the popliteal fossa but I did not see any evidence of cellulitis, could just be that the wrap came down and rubbed in this area. He does not have an open area on the left leg however there is an area on the left dorsal foot at the base of the third toe We have been using silver alginate to all wound areas 3/10; he did not have an open area on his left leg last time he was here a week ago. T oday he arrives with a horizontal wound just below the tibial tuberosity and an area on the left lateral calf. He has intense erythema and tenderness in this area. The area is on the right lateral calf and right posterior calf better than last week. We have been using silver  alginate as usual 3/18 - Patient returns with 3 small open areas on left calf, and 1 small open area on right calf, the skin looks ok with no significant erythema, he continues the UNA boot on right and 4 layer compression on left. The right lateral calf wound is closed , the right posterior is small area. we will continue silver alginate to the areas. 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 He is been tolerating the dressing changes without complication. Fortunately she has made excellent improvement in regard to the overall status of his wounds. Unfortunately every time we cease wrapping him he ends up reopening in causing more significant issues at that point. Again I'm unsure of the best direction to take although I think the lymphedema clinic may be appropriate for him. 02/03/19 on evaluation today patient appears to be doing well in regard to the wounds that we saw him for last week unfortunately he has a new area on the proximal portion of his right medial/posterior lower extremity where the wrap somewhat slowed down and caused swelling and a blister to rub and open. Unfortunately this is the only opening that he has on either leg at this point. 02/17/19 on evaluation today patient's bilateral lower extremities appear to be doing well. He still completely healed in regard to the left lower extremity. In regard to the right lower extremity the area where the wrap and slid down and caused the blister still seems to be  slightly open although this is dramatically better than during the last evaluation two weeks ago. I'm very pleased with the way this stands overall. 03/03/19 on evaluation today patient appears to be doing well in regard to his right lower extremity in general although he did have a new  blister open this does not appear to be showing any evidence of active infection at this time. Fortunately there's No fevers, chills, nausea, or vomiting noted at this time. Overall I feel like he is making good progress it does feel like that the right leg will we perform the [employer] seems to do with a bit better than three layer wrap on the left which slid down on him. We may switch to doing bilateral in the book wraps. 5/4; I have not seen Mr. Brenner in quite some time. According to our case manager he did not have an open wound on his left leg last week. He had 1 remaining wound on the right posterior medial calf. He arrives today with multiple openings on the left leg probably were blisters and/or wrap injuries from Unna boots. I do not think the Unna boot's will provide adequate compression on the left. I am also not clear about the frequency he is using the compression pumps. 03/17/19 on evaluation today patient appears to be doing excellent in regard to his lower extremities compared to last week's evaluation apparently. He had gotten significantly worse last week which is unfortunate. The [employer] wrap on the left did not seem to do very well for him at all and in fact it didn't control his swelling significantly enough he had an additional outbreak. Subsequently we go back to the four layer compression wrap on the left. This is good news. At least in that he is doing better and the wound seem to be killing him. He still has not heard anything from the lymphedema clinic. 03/24/19 on evaluation today patient actually appears to be doing much better in regard to his bilateral lower Trinity as compared to last week when I saw him. Fortunately there's no signs of active infection at this time. He has been tolerating the dressing changes without complication. Overall I'm extremely pleased with the progress and appearance in general. 04/07/19 on evaluation today patient appears to be doing well in  regard to his bilateral lower extremities. His swelling is significantly down from where it was previous. With that being said he does have a couple blisters still open at this point but fortunately nothing that seems to be too severe and again the majority of the larger openings has healed at this time. 04/14/19 on evaluation today patient actually appears to be doing quite well in regard to his bilateral lower extremities in fact I'm not even sure there's anything significantly open at this time at any site. Nonetheless he did have some trouble with these wraps where they are somewhat irritating him secondary to the fact that he has noted that the graph wasn't too close down to the end of this foot in a little bit short as well up to his knee. Otherwise things seem to be doing quite well. 04/21/19 upon evaluation today patient's wound bed actually showed evidence of being completely healed in regard to both lower extremities which is excellent news. There does not appear to be any signs of active infection which is also good news. I'm very pleased in this regard. No fevers, chills, nausea, or vomiting noted at this time. 04/28/19 on evaluation today patient  appears to be doing a little bit worse in regard to both lower extremities on the left mainly due to the fact that when he went infection disease the wrap was not wrapped quite high enough he developed a blister above this. On the right he is a small open area of nothing too significant but again this is continuing to give him some trouble he has been were in the Velcro compression that he has at home. 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 He's been tolerating the dressing changes without complication. Fortunately there's no signs of active infection at this time. No fevers, chills, nausea, or vomiting noted at this time. We have been trying to get an appointment with her lymphedema  clinic in [facility] but unfortunately nobody can get them on phone with not been able to even fax information over the patient likewise is not been able to get in touch with them. Overall I'm not sure exactly what's going on here with to reach out again today. 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 Still having a lot of drainage unfortunately. He tells me especially in the left but even on the right. There's no signs of active infection which is good news we've been using so ratcheted up to this point. 05/19/19 on evaluation today patient actually appears to be doing quite well with regard to his left lower extremity which is great news. Fortunately in regard to the right lower extremity has an issues with his wrap and he subsequently did remove this from what I'm understanding. Nonetheless long story short is what he had rewrapped once he removed it subsequently had maggots underneath this wrap whenever he came in for evaluation today. With that being said they were obviously completely cleaned away by the nursing staff. The visit today which is excellent news. However he does appear to potentially have some infection around the right ankle region where the maggots were located as well. He will likely require anabiotic therapy today. 05/26/19 on evaluation today patient actually appears to be doing much better in regard to his bilateral lower extremities. I feel like the infection is under much better control. With that being said there were maggots noted when the wrap was removed yet again today. Again this could have potentially been left over from previous although at this time there does not appear to be any signs of significant drainage there was obviously on the wrap some drainage as well this contracted gnats or otherwise. Either way I do not see anything that appears to be doing worse in my  pinion and in fact I think his drainage has slowed down quite significantly likely mainly due to the fact to his infection being under better control. 06/02/2019 on evaluation today patient actually appears to be doing well with regard to his bilateral lower extremities there is no signs of active infection at this time which is great news. With that being said he does have several open areas more so on the right than the left but nonetheless these are all significantly better than previously noted. 06/09/2019 on evaluation today patient actually appears to be doing well. His wrap stayed up and he did not cause any problems he had more drainage on the right compared to the left but overall I do not see any major issues at this time which is  great news. 06/16/2019 on evaluation today patient appears to be doing excellent with regard to his lower extremities the only area that is open is a new blister that can have opened as of today on the medial ankle on the left. Other than this he really seems to be doing great I see no major issues at this point. 06/23/2019 on evaluation today patient appears to be doing quite well with regard to his bilateral lower extremities. In fact he actually appears to be almost completely healed there is a small area of weeping noted of the right lower extremity just above the ankle. Nonetheless fortunately there is no signs of active infection at this time which is good news. No fevers, chills, nausea, vomiting, or diarrhea. 8/24; the patient arrived for a nurse visit today but complained of very significant pain in the left leg and therefore I was asked to look at this. Noted that he did not have an open area on the left leg last week nevertheless this was wrapped. The patient states that he is not been able to put his compression pumps on the left leg because of the discomfort. He has not been systemically unwell 06/30/2019 on evaluation today patient unfortunately despite  being excellent last week is doing much worse with regard to his left lower extremity today. In fact he had to come in for a nurse on Monday where his left leg had to be rewrapped due to excessive weeping Dr. Leanord Hawkingobson placed him on doxycycline at that point. Fortunately there is no signs of active infection Systemically at this time which is good news. 07/07/2019 in regard to the patient's wounds today he actually seems to be doing well with his right lower extremity there really is nothing open or draining at this point this is great news. Unfortunately the left lower extremity is given him additional trouble at this time. There does not appear to be any signs of active infection nonetheless he does have a lot of edema and swelling noted at this point as well as blistering all of which has led to a much more poor appearing leg at this time compared to where it was 2 weeks ago when it was almost completely healed. Obviously this is a little discouraging for the patient. He is try to contact the lymphedema clinic in [place] he has not been able to get through to them. 07/14/2019 on evaluation today patient actually appears to be doing slightly better with regard to his left lower extremity ulcers. Overall I do feel like at least at the top of the wrap that we have been placing this area has healed quite nicely and looks much better. The remainder of the leg is showing signs of improvement. Unfortunately in the thigh area he still has an open region on the left and again on the right he has been utilizing just a Band-Aid on an area that also opened on the thigh. Again this is an area that were not able to wrap although we did do an Ace wrap to provide some compression that something that obviously is a little less effective than the compression wraps we have been using on the lower portion of the leg. He does have an appointment [place] [facility] [place] [facility] [place]. 07/21/2019 on  evaluation today patient appears to be doing better with regard to his lower extremity ulcers. He has been tolerating the dressing changes without complication. Fortunately there is no signs of active infection at this time.  No fevers, chills, nausea, vomiting, or diarrhea. I did receive the paperwork from the physical therapist at the lymphedema clinic in [place]. Subsequently I signed off on that this morning and sent that back to him for further progression with the treatment plan. 07/28/2019 on evaluation today patient appears to be doing very well with regard to his right lower extremity where I do not see any open wounds at this point. Fortunately he is feeling great as far as that is concerned as well. In regard to the left lower extremity he has been having issues with still several areas of weeping and edema although the upper leg is doing better his lower leg still I think is going require the compression wrap at this time. No fevers, chills, nausea, vomiting, or diarrhea. 08/04/2019 on evaluation today patient unfortunately is having new wounds on the right lower extremity. Again we have been using Unna boot wrap on that side. We switched him to using his juxta lite wrap at home. With that being said he tells me he has been using it although his legs extremely swollen and to be honest really does not appear that he has been. I cannot know that for sure however. Nonetheless he has multiple new wounds on the right lower extremity at this time. Obviously we will have to see about getting this rewrapped for him today. 08/11/2019 on evaluation today patient appears to be doing fairly well with regard to his wounds. He has been tolerating the dressing changes including the compression wraps without complication. He still has a lot of edema in his upper thigh regions bilaterally he is supposed to be seeing the lymphedema clinic on the 15th of this month once his wraps arrive for the upper part  of his legs. 08/18/2019 on evaluation today patient appears to be doing well with regard to his bilateral lower extremities at this point. He has been tolerating the dressing changes without complication. Fortunately there is no signs of active infection which is also good news. He does have a couple weeping areas on the first and second toe of the right foot he also has just a small area on the left foot upper leg and a small area on the left lower leg but overall he is doing quite well in my opinion. He is supposed to be getting his wraps shortly in fact tomorrow and then subsequently is seeing the lymphedema clinic next Wednesday on the 21st. Of note he is also leaving on the 25th to go on vacation for a week to the beach. For that reason and since there is some uncertainty about what there can be doing at lymphedema clinic next Wednesday I am get a make an appointment for next Friday here for [place] to see what we need to do for him prior to him leaving for vacation. 10/23; patient arrives in considerable pain predominantly in the upper posterior calf just distal to the popliteal fossa also in the wound anteriorly above the major wound. This is probably cellulitis and he has had this recurrently in the past. He has no open wound on the right side and he has had an Radio broadcast assistant in that area. Finally I note that he has an area on the left posterior calf which by enlarge is mostly epithelialized. This protrudes beyond the borders of the surrounding skin in the setting of dry scaly skin and lymphedema. The patient is leaving for [facility] on Sunday. Per his longstanding pattern, he will not take his  compression pumps with him predominantly out of fear that they will be stolen. He therefore asked that we put a Unna boot back on the right leg. He will also contact the wound care center in [name] to see if they can change his dressing in the mid week. 11/3; patient returned from his vacation to [facility]. He was seen on 1 occasion at their wound care center. They did a 2 layer compression system as they did not have our 4-layer wrap. I am not completely certain what they put on the wounds. They did not change the Unna boot on the right. The patient is also seeing a lymphedema specialist physical therapist in [place]. It appears that he has some compression sleeve for his thighs which indeed look quite a bit better than I am used to seeing. He pumps over these with his external compression pumps. 11/10; the patient has a new wound on the right medial thigh otherwise there is no open areas on the right. He has an area on the left leg posteriorly anteriorly and medially and an area over the left second toe. We have been using silver alginate. He thinks the injury on his thigh is secondary to friction from the compression sleeve he has. 11/17; the patient has a new wound on the right medial thigh last week. He thinks this is because he did not have a underlying stocking for his thigh juxta lite apparatus. He now has this. The area is fairly large and somewhat angry but I do not think he has underlying cellulitis. He has a intact blister on the right anterior tibial area. Small wound on the right great toe dorsally Small area on the medial left calf. 11/30; the patient does not have any open areas on his right leg and we did not take his juxta lite stocking off. However he states that on Friday his compression wrap fell down lodging around his upper mid calf area. As usual this creates a lot of problems for him. He called urgently today to be seen for a nurse visit however the nurse visit turned into a provider visit because of extreme erythema and pain in the left anterior tibia extending laterally and posteriorly. The area that is problematic is extensive 10/06/2019 upon evaluation today patient actually appears to be doing poorly in regard to his left lower extremity. He Dr. Leanord Hawking did place  him on doxycycline this past Monday apparently due to the fact that he was doing much worse in regard to this left leg. Fortunately the doxycycline does seem to be helping. Unfortunately we are still having a very difficult time getting his edema under any type of control in order to anticipate discharge at some point. The only way were really able to control his lymphedema really is with compression wraps and that has only even seemingly temporary. He has been seeing a lymphedema clinic they are trying to help in this regard but still this has been somewhat frustrating in general for the patient. 10/13/19 on evaluation today patient appears to be doing excellent with regard to his right lower extremity as far as the wounds are concerned. His swelling is still quite extensive unfortunately. He is still having a lot of drainage from the thigh areas bilaterally which is unfortunate. He's been going to lymphedema clinic but again he still really does not have this edema under control as far as his lower extremities are concern. With regard to his left lower extremity this seems to  be improving and I do believe the doxycycline has been of benefit for him. He is about to complete the doxycycline. 10/20/2019 on evaluation today patient appears to be doing poorly in regard to his bilateral lower extremities. More in the right thigh he has a lot of irritation at this site unfortunately. In regard to the left lower extremity the wrap was not quite as high it appears and does seem to have caused him some trouble as well. Fortunately there is no evidence of systemic infection though he does have some blue-green drainage which has me concerned for the possibility of Pseudomonas. He tells me he is previously taking Cipro without complications and he really does not care for Levaquin however due to some of the side effects he has. He is not allergic to any medications specifically antibiotics that were aware  of. 10/27/2019 on evaluation today patient actually does appear to be for the most part doing better when compared to last week's evaluation. With that being said he still has multiple open wounds over the bilateral lower extremities. He actually forgot to start taking the Cipro and states that he still has the whole bottle. He does have several new blisters on left lower extremity today I think I would recommend he go ahead and take the Cipro based on what I am seeing at this point. 12/30-Patient comes at 1 week visit, 4 layer compression wraps on the left and Unna boot on the right, primary dressing Xtrasorb and silver alginate. Patient is taking his Cipro and has a few more days left probably 5-6, and the legs are doing better. He states he is using his compressions devices which I believe he has 11/10/2019 on evaluation today patient actually appears to be much better than last time I saw him 2 weeks ago. His wounds are significantly improved and overall I am very pleased in this regard. Fortunately there is no signs of active infection at this time. He is just a couple of days away from completing Cipro. Overall his edema is much better he has been using his lymphedema pumps which I think is also helping at this point. 11/17/2019 on evaluation today patient appears to be doing excellent in regard to his wounds in general. His legs are swollen but not nearly as much as they have been in the past. Fortunately he is tolerating the compression wraps without complication. No fevers, chills, nausea, vomiting, or diarrhea. He does have some erythema however in the distal portion of his right lower extremity specifically around the forefoot and toes there is a little bit of warmth here as well. 11/24/2019 on evaluation today patient appears to be doing well with regard to his right lower extremity I really do not see any open wounds at this point. His left lower extremity does have several open areas and his  right medial thigh also is open. Other than this however overall the patient seems to be making good progress and I am very pleased at this point. 12/01/2019 on evaluation today patient appears to be doing poorly at this point in regard to his left lower extremity has several new blisters despite the fact that we have him in compression wraps. In fact he had a 4-layer compression wrap, his upper thigh wrapped from lymphedema clinic, and a juxta light over top of the 4 layer compression wrap the lymphedema clinic applied and despite all this he still develop blisters underneath. Obviously this does have me concerned about the fact that unfortunately  despite what we are doing to try to get wounds healed he continues to have new areas arise I do not think he is ever good to be at the point where he can realistically just use wraps at home to keep things under control. Typically when we heal him it takes about 1-2 days before he is back in the clinic with severe breakdown and blistering of his lower extremities bilaterally. This is happened numerous times in the past. Unfortunately I think that we may need some help as far as overall fluid overload to kind of limit what we are seeing and get things under better control. 12/08/2019 on evaluation today patient presents for follow-up concerning his ongoing bilateral lower extremity edema. Unfortunately he is still having quite a bit of swelling the compression wraps are controlling this to some degree but he did see Dr. [name] his cardiologist I do have that available for review today as far as the appointment was concerned that was on 12/06/2019. Obviously that she has been 2 days ago. The patient states that he is only been taking the Lasix 80 mg 1 time a day he had told me previously he was taking this twice a day. Nonetheless Dr. [name] recommended this be up to 80 mg 2 times a day for the patient as he did appear to be fluid overloaded. With that being said the  patient states he did this yesterday and he was unable to go anywhere or do anything due to the fact that he was constantly having to urinate. Nonetheless I think that this is still good to be something that is important for him as far as trying to get his edema under control at all things that he is going to be able to just expect his wounds to get under control and things to be better without going through at least a period of time where he is trying to stabilize his fluid management in general and I think increasing the Lasix is likely the first step here. It was also mentioned the possibility that the patient may require metolazone. With that being said he wanted to have the patient take Lasix twice a day first and then reevaluating 2 months to see where things stand. 12/15/2019 upon evaluation today patient appears to be doing regard to his legs although his toes are showing some signs of weeping especially on the left at this point to some degree on the right. There does not appear to be any signs of active infection and overall I do feel like the compression wraps are doing well for him but he has not been able to take the Lasix at home and the increased dose that Dr. [name] recommended. He tells me that just not go to be feasible for him. Nonetheless I think in this case he should probably send a message to Dr. [name] in order to discuss options from the standpoint of possible admission to get the fluid off or otherwise going forward. 12/22/2019 upon evaluation today patient appears to be doing fairly well with regard to his lower extremities at this point. In fact he would be doing excellent if it was not for the fact that his right anterior thigh apparently had an allergic reaction to adhesive tape that he used. The wound itself that we have been monitoring actually appears to be healed. There is a lot of irritation at this point. 12/29/2019 upon evaluation today patient appears to be doing well  in regard to his lower  extremities. His left medial thigh is open and somewhat draining today but this is the only region that is open the right has done much better with the treatment utilizing the steroid cream that I prescribed for him last week. Overall I am pleased in that regard. Fortunately there is no signs of active infection at this time. No fevers, chills, nausea, vomiting, or diarrhea. 01/05/2020 upon evaluation today patient appears to be doing more poorly in regard to his right lower extremity at this point upon evaluation today. Unfortunately he continues to have issues in this regard and I think the biggest issue is controlling his edema. This obviously is not very well controlled at this point is been recommended that he use the Lasix twice a day but he has not been able to do that. Unfortunately I think this is leading to an issue where honestly he is not really able to effectively control his edema and therefore the wounds really are not doing significantly better. I do not think that he is going to be able to keep things under good control unless he is able to control his edema much better. I discussed this again in great detail with him today. 01/12/2020 good news is patient actually appears to be doing quite well today at this point. He does have an appointment with lymphedema clinic tomorrow. His legs appear healed and the toe on the left is almost completely healed. In general I am very pleased with how things stand at this point. 01/19/2020 upon evaluation today patient appears to actually be doing well in regard to his lower extremities there is nothing open at this point. Fortunately he has done extremely well more recently. Has been seeing lymphedema clinic as well. With that being said he has Velcro wraps for his lower legs as well as his upper legs. The only wound really is on his toe which is the right great toe and this is barely anything even there. With all that being said I  think it is good to be appropriate today to go ahead and switch him over to the Velcro compression wraps. 01/26/2020 upon evaluation today patient appears to be doing worse with regard to his lower extremities after last week switch him to Velcro compression wraps. Unfortunately he lasted less than 24 hours he did not have the sock portion of his Velcro wrap on the left leg and subsequently developed a blister underneath the Velcro portion. Obviously this is not good and not what we were looking for at this point. He states the lymphedema clinic did tell him to wear the wrap for 23 hours and take him off for 1 I am okay with that plan but again right now we got a get things back under control again he may have some cellulitis noted as well. 02/02/2020 upon evaluation today patient unfortunately appears to have several areas of blistering on his bilateral lower extremities today mainly on the feet. His legs do seem to be doing somewhat better which is good news. Fortunately there is no evidence of active infection at this time. No fevers, chills, nausea, vomiting, or diarrhea. 02/16/2020 upon evaluation today patient appears to be doing well at this time with regard to his legs. He has a couple weeping areas on his toes but for the most part everything is doing better and does appear to be sealed up on his legs which is excellent news. We can continue with wrapping him at this point as he had every time  we discontinue the wraps he just breaks out with new wounds. There is really no point in is going forward with this at this point. 03/08/2020 upon evaluation today patient actually appears to be doing quite well with regard to his lower extremity ulcers. He has just a very superficial and really almost nonexistent blister on the left lower extremity he has in general done very well with the compression wraps. With that being said I do not see any signs of infection at this time which is good news. 03/29/2020  upon evaluation today patient appears to be doing well with regard to his wounds currently except for where he had several new areas that opened up due to some of the wrap slipping and causing him trouble. He states he did not realize they had slipped. Nonetheless he has a 1 area on the right and 3 new areas on the left. Fortunately there is no signs of active infection at this time which is great news. 04/05/2020 upon evaluation today patient actually appears to be doing quite well in general in regard to his legs currently. Fortunately there is no signs of active infection at this time. No fevers, chills, nausea, vomiting, or diarrhea. He tells me next week that he will actually be seen in the lymphedema clinic on Thursday at 10 AM I see him on Wednesday next week. 04/12/2020 upon evaluation today patient appears to be doing very well with regard to his lower extremities bilaterally. In fact he does not appear to have any open wounds at this point which is good news. Fortunately there is no signs of active infection at this time. No fevers, chills, nausea, vomiting, or diarrhea. 04/19/2020 upon evaluation today patient appears to be doing well with regard to his wounds currently on the bilateral lower extremities. There does not appear to be any signs of active infection at this time. Fortunately there is no evidence of systemic infection and overall very pleased at this point. Nonetheless after I held him out last week he literally had blisters the next morning already which swelled up with him being right back here in the clinic. Overall I think that he is just not can be able to be discharged with his legs the way they are he is much to volume overloaded as far as fluid is concerned and that was discussed with him today of also discussed this but should try the clinic nurse manager as well as Dr. Leanord Hawking. 04/26/2020 upon evaluation today patient appears to be doing better with regard to his wounds  currently. He is making some progress and overall swelling is under good control with the compression wraps. Fortunately there is no evidence of active infection at this time. 05/10/2020 on evaluation today patient appears to be doing overall well in regard to his lower extremities bilaterally. He is Tolerating the compression wraps without complication and with what we are seeing currently I feel like that he is making excellent progress. There is no signs of active infection at this time. 05/24/2020 upon evaluation today patient appears to be doing well in regard to his legs. The swelling is actually quite a bit down compared to where it has been in the past. Fortunately there is no sign of active infection at this time which is also good news. With that being said he does have several wounds on his toes that have opened up at this point. 05/31/2020 upon evaluation today patient appears to be doing well with regard to his legs  bilaterally where he really has no significant fluid buildup at this point overall he seems to be doing quite well. Very pleased in this regard. With regard to his toes these also seem to be drying up which is excellent. We have continue to wrap him as every time we tried as a transition to the juxta light wraps things just do not seem to get any better. 06/07/2020 upon evaluation today patient appears to be doing well with regard to his right leg at this point. Unfortunately left leg has a lot of blistering he tells me the wrap started to slide down on him when he tried to put his other Velcro wrap over top of it to help keep things in order but nonetheless still had some issues. 06/14/2020 on evaluation today patient appears to be doing well with regard to his lower extremity ulcers and foot ulcers at this point. I feel like everything is actually showing signs of improvement which is great news overall there is no signs of active infection at this time. No fevers, chills, nausea,  vomiting, or diarrhea. 06/21/2020 on evaluation today patient actually appears to be doing okay in regard to his wounds in general. With that being said the biggest issue I see is on his right foot in particular the first and second toe seem to be doing a little worse due to the fact this is staying very wet. I think he is probably getting need to change out his dressings a couple times in between each week when we see him in regard to his toes in order to keep this drier based on the location and how this is proceeding. 06/28/2020 on evaluation today patient appears to be doing a little bit more poorly overall in regard to the appearance of the skin I am actually somewhat concerned about the possibility of him having a little bit of an infection here. We discussed the course of potentially giving him a doxycycline prescription which he is taken previously with good result. With that being said I do believe that this is potentially mild and at this point easily fixed. I just do not want anything to get any worse. 07/12/2020 upon evaluation today patient actually appears to be making some progress with regard to his legs which is great news there does not appear to be any evidence of active infection. Overall very pleased with where things stand. 07/26/2020 upon evaluation today patient appears to be doing well with regard to his leg ulcers and toe ulcers at this point. He has been tolerating the compression wraps without complication overall very pleased in this regard. 08/09/2020 upon evaluation today patient appears to be doing well with regard to his lower extremities bilaterally. Fortunately there is no signs of active infection overall I am pleased with where things stand. 08/23/2020 on evaluation today patient appears to be doing well with regard to his wound. He has been tolerating the dressing changes without complication. Fortunately there is no signs of active infection at this time. Overall his  legs seem to be doing quite well which is great news and I am very pleased in that regard. No fevers, chills, nausea, vomiting, or diarrhea. 09/13/2020 upon evaluation today patient appears to be doing okay in regard to his lower extremities. He does have a fairly large blister on the right leg which I did remove the blister tissue from today so we can get this to dry out other than that however he seems to be doing quite  well. There is no signs of active infection at this time. 09/27/2020 upon evaluation today patient appears to actually be doing some better in regard to his right leg. Fortunately signs of active infection at this time which is great news. No fevers, chills, nausea, vomiting, or diarrhea. 10/04/2020 upon evaluation today patient actually appears to be showing signs of improvement which is great news with regard to his leg ulcers. Fortunately there is no signs of active infection which is great news he is still taking the antibiotics currently. No fevers, chills, nausea, vomiting, or diarrhea. 10/18/2020 on evaluation today patient appears to be doing well with regard to his legs currently. He has been tolerating the dressing changes including the wraps without complication. Fortunately there is no signs of active infection at this time. No fevers, chills, nausea, vomiting, or diarrhea. 10/25/2020 upon evaluation today patient appears to be doing decently well in regard to his wounds currently. He has been tolerating the dressing changes without complication. Overall I feel like he is making good progress albeit slow. Again this is something we can have to continue to wrap for some time to come most likely. 11/08/2020 upon evaluation today patient appears to be doing well with regard to his wounds currently. He has been tolerating the dressing changes without complication is not currently on any antibiotics and he does not appear to show any signs of infection. He does continue to have a  lot of drainage on the right leg not too severe but nonetheless this is very scattered. On the left leg this is looking to be much improved overall. 11/15/2020 upon evaluation today patient appears to be doing better with regard to his legs bilaterally. Especially the right leg which was much more significant last week. There does not appear to be any signs of active infection which is great news. No fevers, chills, nausea, vomiting, or diarrhea. Electronic Signature(s) Signed: 11/15/2020 11:03:08 AM By: Lenda Kelp PA-C Entered By: Lenda Kelp on 11/15/2020 11:03:07 -------------------------------------------------------------------------------- Physical Exam Details Patient Name: Date of Service: Kevin [name]. 11/15/2020 10:00 A M Medical Record Number: [id number] Patient Account Number: [id number] Date of Birth/Sex: Treating RN: 1950-12-02 (70 y.o. M) Primary Care Provider: Nicoletta Ba Other Clinician: Referring Provider: Treating Provider/Extender: Adele Dan in Treatment: 251 Constitutional Obese and well-hydrated in no acute distress. Respiratory normal breathing without difficulty. Psychiatric this patient is able to make decisions and demonstrates good insight into disease process. Alert and Oriented x 3. pleasant and cooperative. Notes Patient's wound bed actually showed signs of good granulation and epithelization at this point. There does not appear to be any evidence of infection. With that being said I do believe that the patient is having a lot of drainage from the right leg this is something we can need to keep an eye on as well. Electronic Signature(s) Signed: 11/15/2020 11:04:02 AM By: Lenda Kelp PA-C Entered By: Lenda Kelp on 11/15/2020 11:04:01 -------------------------------------------------------------------------------- Physician Orders Details Patient Name: Date of Service: [name], Taje [name]. 11/15/2020 10:00 A  M Medical Record Number: [id number] Patient Account Number: [id number] Date of Birth/Sex: Treating RN: 12/05/1950 (70 y.o. 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 Bathing/ Shower/ Hygiene May shower with protection but do not get wound dressing(s) wet. Edema Control - Lymphedema / SCD / Other Bilateral Lower Extremities Lymphedema Pumps. Use Lymphedema pumps on leg(s) 2-3 times a day for 45-60 minutes. If wearing any wraps or hose, do not remove them. Continue exercising as instructed. Elevate legs to the level of the heart or above for 30 minutes daily and/or when sitting, a frequency of: - throughout the day Avoid standing for long periods of time. Exercise regularly Other Edema Control Orders/Instructions: - 4 layer compression wrap to left leg Non Wound Condition Left Lower Extremity Other Non Wound Condition Orders/Instructions: - lotion to leg, 4 layer compression wrap Wound Treatment Wound #185 - Lower Leg Wound Laterality: Right, Circumferential Peri-Wound Care: Sween Lotion (Moisturizing lotion) 1 x Per Week Discharge Instructions: Apply moisturizing lotion as directed Prim Dressing: KerraCel Ag Gelling Fiber Dressing, 4x5 in (silver alginate) 1 x Per  Week ary Discharge Instructions: Apply silver alginate to wound bed as instructed Secondary Dressing: ABD Pad, 8x10 1 x Per Week Discharge Instructions: Apply over primary dressing as directed. Secondary Dressing: CarboFLEX Odor Control Dressing, 4x4 in 1 x Per Week Discharge Instructions: Apply over primary dressing as directed. Compression Wrap: Unnaboot w/Calamine, 4x10 (in/yd) 1 x Per Week Discharge Instructions: Apply Unnaboot as directed. Wound #190 - T Second oe Wound Laterality: Right Prim Dressing: KerraCel Ag Gelling Fiber Dressing, 2x2 in (silver alginate) ary Discharge Instructions: Apply silver alginate to wound bed as instructed Secondary Dressing: Woven Gauze Sponges 2x2 in Discharge Instructions: Apply over primary dressing as directed. Secured With: Insurance underwriter, Sterile 2x75 (in/in) Discharge Instructions: Secure with stretch gauze as directed. Electronic Signature(s) Signed: 11/15/2020 5:15:08 PM By: Lenda Kelp PA-C Signed: 11/15/2020 5:15:42 PM By: Zenaida Deed RN, BSN Entered By: Zenaida Deed on 11/15/2020 11:02:24 -------------------------------------------------------------------------------- Problem List Details Patient Name: Date of Service: [facility], Tanya [name]. 11/15/2020 10:00 A M Medical Record Number: [id number] Patient Account Number: [id number] Date of Birth/Sex: Treating RN: 07/22/1951 (70 y.o. M) Primary Care Provider: Nicoletta Ba Other Clinician: Referring Provider: Treating Provider/Extender: Adele Dan in Treatment: [phone] Active Problems ICD-10 Encounter Code Description Active Date MDM Diagnosis 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 skin 06/30/2018 No Yes L97.221 Non-pressure chronic ulcer of left calf limited to breakdown of skin 09/30/2016 No Yes 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 of 01/22/2016 No  Yes bilateral lower extremity I89.0 Lymphedema, not elsewhere classified 01/22/2016 No Yes E11.622 Type 2 diabetes mellitus with other skin ulcer 01/22/2016 No Yes 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, unspecified 01/22/2016 No Yes L03.116 Cellulitis of left lower limb 04/01/2017 No Yes Inactive Problems ICD-10 Code Description Active Date Inactive Date 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 skin 06/30/2017 06/30/2017 L97.521 Non-pressure chronic ulcer of other 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 of skin 04/27/2018 04/27/2018 L03.115 Cellulitis of right lower limb 12/22/2017 12/22/2017 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 06/30/2018 06/30/2018 L97.511 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 of skin 06/30/2018 06/30/2018 Resolved Problems Electronic Signature(s)  Signed: 11/15/2020 10:38:11 AM By: Lenda Kelp PA-C Entered By: Lenda Kelp on 11/15/2020 10:38:11 -------------------------------------------------------------------------------- Progress Note Details Patient Name: Date of Service: [name], [place]hol[place] [name]. 11/15/2020 10:00 A M Medical Record Number: [id number] Patient Account Number: [id number] Date of Birth/Sex: Treating RN: 1951-03-05 (70 y.o. M) Primary Care Provider: Nicoletta Ba Other Clinician: Referring Provider: Treating Provider/Extender: Adele Dan in Treatment: 251 Subjective Chief Complaint Information obtained from Patient patient is here for evaluation venous/lymphedema weeping History of Present Illness (HPI) Referred by PCP for consultation. Patient has long standing history of BLE venous stasis, no prior ulcerations. At beginning of month, developed cellulitis and weeping. Received IM Rocephin followed by Keflex and resolved. Wears compression stocking, appr 6 months old. Not sure strength. No present  drainage. 01/22/16 this is a patient who is a type II diabetic on insulin. 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 He tells me he religiously wears pressure stockings of uncertain strength. He was here with weeping edema about 8 months ago but did not have an open wound. Roughly a month ago he had a reopening on his bilateral legs. He is been using bandages and Neosporin. He does not complain of pain. 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 Last BUN/creatinine I have is from 11/20/15 at 13 and 1.0 respectively 01/29/16; patient arrives today having tolerated the Profore wrap. He brought in his stockings and these are 18 mmHg stockings he bought from [place]. The compression here is likely inadequate. He does not complain of pain or excessive drainage she has no systemic symptoms. The wound on the right looks improved as does the one on the left although one on the left is more substantial with still tissue at risk below the actual wound area on the bilateral posterior calf 02/05/16; patient arrives with poor edema control. He states that we did put a 4 layer compression on it last week. No weight appear 5 this. 02/12/16; the area on the posterior right Has healed. The left Has a substantial wound that has necrotic surface eschar that requires a debridement with a curette. 02/16/16;the patient called or a Nurse visit secondary to increased swelling. He had been in earlier in the week with his right leg healed. He was transitioned to is on pressure stocking on the right leg with the only open wound on the left, a substantial area on the left posterior calf. Note he has a history of severe lower extremity edema, he has a history of chronic atrial fibrillation but not heart failure per my notes but I'll need to research this. He is not complaining of chest pain  shortness of breath or orthopnea. The intake nurse noted blisters on the previously closed right leg 02/19/16; this is the patient's regular visit day. I see him on Friday with escalating edema new wounds on the right leg and clear signs of at least right ventricular heart failure. I increased his Lasix to 40 twice a day. He is returning currently in follow-up. States he is noticed a decrease in that the edema 02/26/16 patient's legs have much less edema. There is nothing really open on the right leg. The left leg has improved condition of the large superficial wound on the posterior left leg 03/04/16; edema control is very much better. The patient's right leg wounds have healed. On the left leg  he continues to have severe venous inflammation on the posterior aspect of the left leg. There is no tenderness and I don't think any of this is cellulitis. 03/11/16; patient's right leg is married healed and he is in his own stocking. The patient's left leg has deteriorated somewhat. There is a lot of erythema around the wound on the posterior left leg. There is also a significant rim of erythema posteriorly just above where the wrap would've ended there is a new wound in this location and a lot of tenderness. Can't rule out cellulitis in this area. 03/15/16; patient's right leg remains healed and he is in his own stocking. The patient's left leg is much better than last review. His major wound on the posterior aspect of his left Is almost fully epithelialized. He has 3 small injuries from the wraps. Really. Erythema seems a lot better on antibiotics 03/18/16; right leg remains healed and he is in his own stocking. The patient's left leg is much better. The area on the posterior aspect of the left calf is fully epithelialized. His 3 small injuries which were wrap injuries on the left are improved only one seems still open his erythema has resolved 03/25/16; patient's right leg remains healed and he is in his own  stocking. There is no open area today on the left leg posterior leg is completely closed up. His wrap injuries at the superior aspect of his leg are also resolved. He looks as though he has some irritation on the dorsal ankle but this is fully epithelialized without evidence of infection. 03/28/16; we discharged this patient on Monday. Transitioned him into his own stocking. There were problems almost immediately with uncontrolled swelling weeping edema multiple some of which have opened. He does not feel systemically unwell in particular no chest pain no shortness of breath and he does not feel 04/08/16; the edema is under better control with the Profore light wrap but he still has pitting edema. There is one large wound anteriorly 2 on the medial aspect of his left leg and 3 small areas on the superior posterior calf. Drainage is not excessive he is tolerating a Profore light well 04/15/16; put a Profore wrap on him last week. This is controlled is edema however he had a lot of pain on his left anterior foot most of his wounds are healed 04/22/16 once again the patient has denuded areas on the left anterior foot which he states are because his wrap slips up word. He saw his primary physician today is on Lasix 40 twice a day and states that he his weight is down 20 pounds over the last 3 months. 04/29/16: Much improved. left anterior foot much improved. He is now on Lasix 80 mg per day. Much improved edema control 05/06/16; I was hoping to be able to discharge him today however once again he has blisters at a low level of where the compression was placed last week mostly on his left lateral but also his left medial leg and a small area on the anterior part of the left foot. 05/09/16; apparently the patient went home after his appointment on 7/4 later in the evening developing pain in his upper medial thigh together with subjective fever and chills although his temperature was not taken. The pain was so intense  he felt he would probably have to call 911. However he then remembered that he had leftover doxycycline from a previous round of antibiotics and took these. By the next morning he  felt a lot better. He called and spoke to one of our nurses and I approved doxycycline over the phone thinking that this was in relation to the wounds we had previously seen although they were definitely were not. The patient feels a lot better old fever no chills he is still working. Blood sugars are reasonably controlled 05/13/16; patient is back in for review of his cellulitis on his anterior medial upper thigh. He is taking doxycycline this is a lot better. Culture I did of the nodular area on the dorsal aspect of his foot grew MRSA this also looks a lot better. 05/20/16; the patient is cellulitis on the medial upper thigh has resolved. All of his wound areas including the left anterior foot, areas on the medial aspect of the left calf and the lateral aspect of the calf at all resolved. He has a new blister on the left dorsal foot at the level of the fourth toe this was excised. No evidence of infection 05/27/16; patient continues to complain weeping edema. He has new blisterlike wounds on the left anterior lateral and posterior lateral calf at the top of his wrap levels. The area on his left anterior foot appears better. He is not complaining of fever, pain or pruritus in his feet. 05/30/16; the patient's blisters on his left anterior leg posterior calf all look improved. He did not increase the Lasix 100 mg as I suggested because he was going to run out of his 40 mg tablets. He is still having weeping edema of his toes 06/03/16; I renewed his Lasix at 80 mg once a day as he was about to run out when I last saw him. He is on 80 mg of Lasix now. I have asked him to cut down on the excessive amount of water he was drinking and asked him to drink according to his thirst mechanisms 06/12/2016 -- was seen 2 days ago and was  supposed to wear his compression stockings at home but he is developed lymphedema and superficial blisters on the left lower extremity and hence came in for a review 06/24/16; the remaining wound is on his left anterior leg. He still has edema coming from between his toes. There is lymphedema here however his edema is generally better than when I last saw this. He has a history of atrial fibrillation but does not have a known history of congestive heart failure nevertheless I think he probably has this at least on a diastolic basis. 07/01/16 I reviewed his echocardiogram from January 2017. This was essentially normal. He did not have LVH, EF of 55-60%. His right ventricular function was normal although he did have trivial tricuspid and pulmonic regurgitation. This is not audible on exam however. I increased his Lasix to do massive edema in his legs well above his knees I think in early July. He was also drinking an excessive amount of water at the time. 07/15/16; missed his appointment last week because of the Labor Day holiday on Monday. He could not get another appointment later in the week. Started to feel the wrap digging in superiorly so we remove the top half and the bottom half of his wrap. He has extensive erythema and blistering superiorly in the left leg. Very tender. Very swollen. Edema in his foot with leaking edema fluid. He has not been systemically unwell 07/22/16; the area on the left leg laterally required some debridement. The medial wounds look more stable. His wrap injury wounds appear to have healed. Edema  and his foot is better, weeping edema is also better. He tells me he is meeting with the supplier of the external compression pumps at work 08/05/16; the patient was on vacation last week in [facility]. His wrap is been on for an extended period of time. Also over the weekend he developed an extensive area of tender erythema across his anterior medial thigh. He took to doxycycline  yesterday that he had leftover from a previous prescription. The patient complains of weeping edema coming out of his toes 08/08/16; I saw this patient on 10/2. He was tender across his anterior thigh. I put him on doxycycline. He returns today in follow-up. He does not have any open wounds on his lower leg, he still has edema weeping into his toes. 08/12/16; patient was seen back urgently today to follow-up for his extensive left thigh cellulitis/erysipelas. He comes back with a lot less swelling and erythema pain is much better. I believe I gave him Augmentin and Cipro. His wrap was cut down as he stated a roll down his legs. He developed blistering above the level of the wrap that remained. He has 2 open blisters and 1 intact. 08/19/16; patient is been doing his primary doctor who is increased his Lasix from 40-80 once a day or 80 already has less edema. Cellulitis has remained improved in the left thigh. 2 open areas on the posterior left calf 08/26/16; he returns today having new open blisters on the anterior part of his left leg. He has his compression pumps but is not yet been shown how to use some vital representative from the supplier. 09/02/16 patient returns today with no open wounds on the left leg. Some maceration in his plantar toes 09/10/2016 -- Dr. Leanord Hawking had recently discharged him on 09/02/2016 and he has come right back with redness swelling and some open ulcers on his left lower extremity. He says this was caused by trying to apply his compression stockings and he's been unable to use this and has not been able to use his lymphedema pumps. He had some doxycycline leftover and he has started on this a few days ago. 09/16/16; there are no open wounds on his leg on the left and no evidence of cellulitis. 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 He does not complain of symptoms here. I am not clear use using his external  compression pumps. 09/23/16; I have not seen this patient in 2 weeks. He canceled his appointment 10 days ago as he was going on vacation. He tells me that on Monday he noticed a large area on his posterior left leg which is been draining copiously and is reopened into a large wound. He is been using ABDs and the external part of his juxtalite, according to our nurse this was not on properly. 10/07/16; Still a substantial area on the posterior left leg. Using silver alginate 10/14/16; in general better although there is still open area which looks healthy. Still using silver alginate. He reminds me that this happen before he left for [facility]. T oday while he was showering in the morning. He had been using his juxtalite's 10/21/16; the area on his posterior left leg is fully epithelialized. However he arrives today with a large area of tender erythema in his medial and posterior left thigh just above the knee. I have marked the area. Once again he is reluctant to consider hospitalization. I treated him with oral antibiotics  in the past for a similar situation with resolution I think with doxycycline however this area it seems more extensive to me. He is not complaining of fever but does have chills and says states he is thirsty. His blood sugar today was in the 140s at home 10/25/16 the area on his posterior left leg is fully epithelialized although there is still some weeping edema. The large area of tenderness and erythema in his medial and posterior left thigh is a lot less tender although there is still a lot of swelling in this thigh. He states he feels a lot better. He is on doxycycline and Augmentin that I started last week. This will continued until Tuesday, December 26. I have ordered a duplex ultrasound of the left thigh rule out DVT whether there is an abscess something that would need to be drained I would also like to know. 11/01/16; he still has weeping edema from a not fully  epithelialized area on his left posterior calf. Most of the rest of this looks a lot better. He has completed his antibiotics. His thigh is a lot better. Duplex ultrasound did not show a DVT in the thigh 11/08/16; he comes in today with more Denuded surface epithelium from the posterior aspect of his calf. There is no real evidence of cellulitis. The superior aspect of his wrap appears to have put quite an indentation in his leg just below the knee and this may have contributed. He does not complain of pain or fever. We have been using silver alginate as the primary dressing. The area of cellulitis in the right thigh has totally resolved. He has been using his compression stockings once a week 11/15/16; the patient arrives today with more loss of epithelium from the posterior aspect of his left calf. He now has a fairly substantial wound in this area. The reason behind this deterioration isn't exactly clear although his edema is not well controlled. He states he feels he is generally more swollen systemically. He is not complaining of chest pain shortness of breath fever. T me he has an appointment with his primary physician in early February. He is on 80 mg of oral ells Lasix a day. He claims compliance with the external compression pumps. He is not having any pain in his legs similar to what he has with his recurrent cellulitis 11/22/16; the patient arrives a follow-up of his large area on his left lateral calf. This looks somewhat better today. He came in earlier in the week for a dressing change since I saw him a week ago. He is not complaining of any pain no shortness of breath no chest pain 11/28/16; the patient arrives for follow-up of his large area on the left lateral calf this does not look better. In fact it is larger weeping edema. The surface of the wound does not look too bad. We have been using silver alginate although I'm not certain that this is a dressing issue. 12/05/16; again the patient  follows up for a large wound on the left lateral and left posterior calf this does not look better. There continues to be weeping edema necrotic surface tissue. More worrisome than this once again there is erythema below the wound involving the distal Achilles and heel suggestive of cellulitis. He is on his feet working most of the day of this is not going well. We are changing his dressing twice a week to facilitate the drainage. 12/12/16; not much change in the overall dimensions of the  large area on the left posterior calf. This is very inflamed looking. I gave him an. Doxycycline last week does not really seem to have helped. He found the wrap very painful indeed it seems to of dog into his legs superiorly and perhaps around the heel. He came in early today because the drainage had soaked through his dressings. 12/19/16- patient arrives for follow-up evaluation of his left lower extremity ulcers. He states that he is using his lymphedema pumps once daily when there is "no drainage". He admits to not using his lipedema pumps while under current treatment. His blood sugars have been consistently between 150-200. 12/26/16; the patient is not using his compression pumps at home because of the wetness on his feet. I've advised him that I think it's important for him to use this daily. He finds his feet too wet, he can put a plastic bag over his legs while he is in the pumps. Otherwise I think will be in a vicious circle. We are using silver alginate to the major area on his left posterior calf 01/02/17; the patient's posterior left leg has further of all into 3 open wounds. All of them covered with a necrotic surface. He claims to be using his compression pumps once a day. His edema control is marginal. Continue with silver alginate 01/10/17; the patient's left posterior leg actually looks somewhat better. There is less edema, less erythema. Still has 3 open areas covered with a necrotic surface requiring  debridement. He claims to be using his compression pumps once a day his edema control is better 01/17/17; the patient's left posterior calf look better last week when I saw him and his wrap was changed 2 days ago. He has noted increasing pain in the left heel and arrives today with much larger wounds extensive erythema extending down into the entire heel area especially tender medially. He is not systemically unwell CBGs have been controlled no fever. Our intake nurse showed me limegreen drainage on his AVD pads. 01/24/17; his usual this patient responds nicely to antibiotics last week giving him Levaquin for presumed Pseudomonas. The whole entire posterior part of his leg is much better much less inflamed and in the case of his Achilles heel area much less tender. He has also had some epithelialization posteriorly there are still open areas here and still draining but overall considerably better 01/31/17- He has continue to tolerate the compression wraps. he states that he continues to use the lymphedema pumps daily, and can increase to twice daily on the weekends. He is voicing no complaints or concerns regarding his LLE ulcers 02/07/17-he is here for follow-up evaluation. He states that he noted some erythema to the left medial and anterior thigh, which he states is new as of yesterday. He is concerned about recurrent cellulitis. He states his blood sugars have been slightly elevated, this morning in the 180s 02/14/17; he is here for follow-up evaluation. When he was last here there was erythema superiorly from his posterior wound in his anterior thigh. He was prescribed Levaquin however a culture of the wound surface grew MRSA over the phone I changed him to doxycycline on Monday and things seem to be a lot better. 02/24/17; patient missed his appointment on Friday therefore we changed his nurse visit into a physician visit today. Still using silver alginate on the large area of the posterior left thigh.  He isn't new area on the dorsal left second toe 03/03/17; actually better today although he admits he has  not used his external compression pumps in the last 2 days or so because of work responsibilities over the weekend. 03/10/17; continued improvement. External compression pumps once a day almost all of his wounds have closed on the posterior left calf. Better edema control 03/17/17; in general improved. He still has 3 small open areas on the lateral aspect of his left leg however most of the area on the posterior part of his leg is epithelialized. He has better edema control. He has an ABD pad under his stocking on the right anterior lower leg although he did not let us look at that today. 03/24/17; patient arrives back in clinic today with no open areas however there are areas on the posterior left calf and anterior left calf that are less than 100% epithelialized. His edema is well controlled in the left lower leg. There is some pitting edema probably lymphedema in the left upper thigh. He uses compression pumps at home once per day. I tried to get him to do this twice a day although he is very reticent. 04/01/2017 -- for the last 2 days he's had significant redness, tenderness and weeping and came in for an urgent visit today. 04/07/17; patient still has 6 more days of doxycycline. He was seen by Dr. Meyer Russel last Wednesday for cellulitis involving the posterior aspect, lateral aspect of his Involving his heel. For the most part he is better there is less erythema and less weeping. He has been on his feet for 12 hours o2 over the weekend. Using his compression pumps once a day 04/14/17 arrives today with continued improvement. Only one area on the posterior left calf that is not fully epithelialized. He has intense bilateral venous inflammation associated with his chronic venous insufficiency disease and secondary lymphedema. We have been using silver alginate to the left posterior calf wound In passing  he tells [place] today that the right leg but we have not seen in quite some time has an open area on it but he doesn't want [place] to look at this today states he will show this to [place] next week. 04/21/17; there is no open area on his left leg although he still reports some weeping edema. He showed [place] his right leg today which is the first time we've seen this leg in a long time. He has a large area of open wound on the right leg anteriorly healthy granulation. Quite a bit of swelling in the right leg and some degree of venous inflammation. He told us about the right leg in passing last week but states that deterioration in the right leg really only happened over the weekend 04/28/17; there is no open area on the left leg although there is an irritated part on the posterior which is like a wrap injury. The wound on the right leg which was new from last week at least to [place] is a lot better. 05/05/17; still no open area on the left leg. Patient is using his new compression stocking which seems to be doing a good job of controlling the edema. He states he is using his compression pumps once per day. The right leg still has an open wound although it is better in terms of surface area. Required debridement. A lot of pain in the posterior right Achilles marked tenderness. Usually this type of presentation this patient gives concern for an active cellulitis 05/12/17; patient arrives today with his major wound from last week on the right lateral leg somewhat better. Still requiring  debridement. He was using his compression stocking on the left leg however that is reopened with superficial wounds anteriorly he did not have an open wound on this leg previously. He is still using his juxta light's once daily at night. He cannot find the time to do this in the morning as he has to be at work by 7 AM 05/19/17; right lateral leg wound looks improved. No debridement required. The concerning area is on the left posterior leg which  appears to almost have a subcutaneous hemorrhagic component to it. We've been using silver alginate to all the wounds 05/26/17; the right lateral leg wound continues to look improved. However the area on the left posterior calf is a tightly adherent surface. [place] using silver alginate. Because of the weeping edema in his legs there is very little good alternatives. 06/02/17; the patient left here last week looking quite good. Major wound on the left posterior calf and a small one on the right lateral calf. Both of these look satisfactory. He tells me that by Wednesday he had noted increased pain in the left leg and drainage. He called on Thursday and Friday to get an appointment here but we were blocked. He did not go to urgent care or his primary physician. He thinks he had a fever on Thursday but did not actually take his temperature. He has not been using his compression pumps on the left leg because of pain. I advised him to go to the emergency room today for IV antibiotics for stents of left leg cellulitis but he has refused I have asked him to take 2 days off work to keep his leg elevated and he has refused this as well. In view of this I'm going to call him and Augmentin and doxycycline. 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 However after a detailed discussion regarding the risk benefits and alternatives he refuses to get admitted to the hospital. With no other recourse I will continue him on oral antibiotics as before and hopefully he'll have his infectious disease consultation this week. 06/16/2017 -- the patient was seen today by the nurse practitioner at infectious disease Ms. Dixon. Her review noted recurrent cellulitis of the lower  extremity with tinea pedis of the left foot and she has recommended clindamycin 150 mg daily for now and she may increase it to 300 mg daily to cover staph and Streptococcus. He has also been advise Lotrimin cream locally. she also had wise IV antibiotics for his condition if it flares up 06/23/17; patient arrives today with drainage bilaterally although the remaining wound on the left posterior calf after cleaning up today "highlighter yellow drainage" did not look too bad. Unfortunately he has had breakdown on the right anterior leg [previously this leg had not been open and he is using a black stocking] he went to see infectious disease and is been put on clindamycin 150 mg daily, I did not verify the dose although I'm not familiar with using clindamycin in this dosing range, perhaps for prophylaxisoo 06/27/17; I brought this patient back today to follow-up on the wound deterioration on the right lower leg together with surrounding cellulitis. I started him on doxycycline 4 days ago. This area looks better however he comes in  today with intense cellulitis on the medial part of his left thigh. This is not have a wound in this area. Extremely tender. We've been using silver alginate to the wounds on the right lower leg left lower leg with bilateral 4 layer compression he is using his external compression pumps once a day 07/04/17; patient's left medial thigh cellulitis looks better. He has not been using his compression pumps as his insert said it was contraindicated with cellulitis. His right leg continues to make improvements all the wounds are still open. We only have one remaining wound on the left posterior calf. Using silver alginate to all open areas. He is on doxycycline which I started a week ago and should be finishing I gave him Augmentin after Thursday's visit for the severe cellulitis on the left medial thigh which fortunately looks better 07/14/17; the patient's left medial thigh cellulitis  has resolved. The cellulitis in his right lower calf on the right also looks better. All of his wounds are stable to improved we've been using silver alginate he has completed the antibiotics I have given him. He has clindamycin 150 mg once a day prescribed by infectious disease for prophylaxis, I've advised him to start this now. We have been using bilateral Unna boots over silver alginate to the wound areas 07/21/17; the patient is been to see infectious disease who noted his recurrent problems with cellulitis. He was not able to tolerate prophylactic clindamycin therefore he is on amoxicillin 500 twice a day. He also had a second daily dose of Lasix added By Dr. Oneta Rack but he is not taking this. Nor is he being completely compliant with his compression pumps a especially not this week. He has 2 remaining wounds one on the right posterior lateral lower leg and one on the left posterior medial lower leg. 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 The patient has Unna boots bilaterally. 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 He has bilateral Unna boots for compression and silver alginate to his wounds. Arrives today with his legs looking as good as I have seen him in quite some time. Not surprisingly his wounds look better as well with improvement on the right lateral leg venous insufficiency wound and also the left medial leg. He is still using the compression pumps once a day 08/11/17; both legs appear to be doing better wounds on the right lateral and left medial legs look better. Skin on the right leg quite good. He is been using silver alginate as the primary dressing. I'm going to use Anasept gel calcium alginate and maintain all the secondary dressings 08/18/17; the  patient continues to actually do quite well. The area on his right lateral leg is just about closed the left medial also looks better although it is still moist in this area. His edema is well controlled we have been using Anasept gel with calcium alginate and the usual secondary dressings, 4 layer compression and once daily use of his compression pumps "always been able to manage 09/01/17; the patient continues to do reasonably well in spite of his trip to [place]. The area on the right lateral leg is epithelialized. Left is much better but still open. He has more edema and more chronic erythema on the left leg [venous inflammation]  09/08/17; he arrives today with no open wound on the right lateral leg and decently controlled edema. Unfortunately his left leg is not nearly as in his good situation as last week.he apparently had increasing edema starting on Saturday. He edema soaked through into his foot so used a plastic bag to walk around his home. The area on the medial right leg which was his open area is about the same however he has lost surface epithelium on the left lateral which is new and he has significant pain in the Achilles area of the left foot. He is already on amoxicillin chronically for prophylaxis of cellulitis in the left leg 09/15/17; he is completed a week of doxycycline and the cellulitis in the left posterior leg and Achilles area is as usual improved. He still has a lot of edema and fluid soaking through his dressings. There is no open wound on the right leg. He saw infectious disease NP today 09/22/17;As usual 1 we transition him from our compression wraps to his stockings things did not go well. He has several small open areas on the right leg. He states this was caused by the compression wrap on his skin although he did not wear this with the stockings over them. He has several superficial areas on the left leg medially laterally posteriorly. He does not have any evidence  of active cellulitis especially involving the left Achilles The patient is traveling from [facility] Saturday going to [facility]. He states he isn't attempting to get an appointment with a heel objects wound center there to change his dressings. I am not completely certain whether this will work 10/06/17; the patient came in on Friday for a nurse visit and the nurse reported that his legs actually look quite good. He arrives in clinic today for his regular follow-up visit. He has a new wound on his left third toe over the PIP probably caused by friction with his footwear. He has small areas on the left leg and a very superficial but epithelialized area on the right anterior lateral lower leg. Other than that his legs look as good as I've seen him in quite some time. We have been using silver alginate Review of systems; no chest pain no shortness of breath other than this a 10 point review of systems negative 10/20/17; seen by Dr. Meyer Russel last week. He had taken some antibiotics [doxycycline] that he had left over. Dr. Meyer Russel thought he had candida infection and declined to give him further antibiotics. He has a small wound remaining on the right lateral leg several areas on the left leg including a larger area on the left posterior several left medial and anterior and a small wound on the left lateral. The area on the left dorsal third toe looks a lot better. ROS; Gen.; no fever, respiratory no cough no sputum Cardiac no chest pain other than this 10 point review of system is negative 10/30/17; patient arrives today having fallen in the bathtub 3 days ago. It took him a while to get up. He has pain and maceration in the wounds on his left leg which have deteriorated. He has not been using his pumps he also has some maceration on the right lateral leg. 11/03/17; patient continues to have weeping edema especially in the left leg. This saturates his dressings which were just put on on 12/27. As usual  the doxycycline seems to take care of the cellulitis on his lower leg. He is not complaining of fever, chills,  or other systemic symptoms. He states his leg feels a lot better on the doxycycline I gave him empirically. He also apparently gets injections at his primary doctor's officeo Rocephin for cellulitis prophylaxis. I didn't ask him about his compression pump compliance today I think that's probably marginal. Arrives in the clinic with all of his dressings primary and secondary macerated full of fluid and he has bilateral edema 11/10/17; the patient's right leg looks some better although there is still a cluster of wounds on the right lateral. The left leg is inflamed with almost circumferential skin loss medially to laterally although we are still maintaining anteriorly. He does not have overt cellulitis there is a lot of drainage. He is not using compression pumps. We have been using silver alginate to the wound areas, there are not a lot of options here 11/17/17; the patient's right leg continues to be stable although there is still open wounds, better than last week. The inflammation in the left leg is better. Still loss of surface layer epithelium especially posteriorly. There is no overt cellulitis in the amount of edema and his left leg is really quite good, tells me he is using his compression pumps once a day. 11/24/17; patient's right leg has a small superficial wound laterally this continues to improve. The inflammation in the left leg is still improving however we have continuous surface layer epithelial loss posteriorly. There is no overt cellulitis in the amount of edema in both legs is really quite good. He states he is using his compression pumps on the left leg once a day for 5 out of 7 days 12/01/17; very small superficial areas on the right lateral leg continue to improve. Edema control in both legs is better today. He has continued loss of surface epithelialization and left  posterior calf although I think this is better. We have been using silver alginate with large number of absorptive secondary dressings 4 layer on the left Unna boot on the right at his request. He tells me he is using his compression pumps once a day 12/08/17; he has no open area on the right leg is edema control is good here. ooOn the left leg however he has marked erythema and tenderness breakdown of skin. He has what appears to be a wrap injury just distal to the popliteal fossa. 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 There was swelling erythema and breakdown in the right leg. He has much worse skin breakdown in the left leg as well multiple open areas medially and posteriorly as well as laterally. He tells me he has been using his compression pumps but tells me he feels that the drainage out of his leg is worse when he uses a compression pumps. T be fair to him he is been saying this o for a while however I don't know that I have really been listening to this. I wonder if the compression pumps are working properly 12/22/17;. Once again he arrives with severe erythema, weeping edema from the left greater than right leg. Noncompliance with compression pumps. New this visit he is complaining of pain on the lateral aspect of the right leg and the medial aspect of his right thigh. He apparently saw his  cardiologist Dr. [name] who was ordered an echocardiogram area and I think this is a step in the right direction 12/25/17; started his doxycycline Monday night. There is still intense erythema of the right leg especially in the anterior thigh although there is less tenderness. The erythema around the wound on the right lateral  calf also is less tender. He still complaining of pain in the left heel. His wounds are about the same right lateral left medial left lateral. Superficial but certainly not close to closure. He denies being systemically unwell no fever chills no abdominal pain no diarrhea 12/29/17; back in follow-up of his extensive right calf and right thigh cellulitis. I added amoxicillin to cover possible doxycycline resistant strep. This seems to of done the trick he is in much less pain there is much less erythema and swelling. He has his echocardiogram at 11:00 this morning. X-ray of the left heel was also negative. 01/05/18; the patient arrived with his edema under much better control. Now that he is retired he is able to use his compression pumps daily and sometimes twice a day per the patient. He has a wound on the right leg the lateral wound looks better. Area on the left leg also looks a lot better. He has no evidence of cellulitis in his bilateral thighs I had a quick peak at his echocardiogram. He is in normal ejection fraction and normal left ventricular function. He has moderate pulmonary hypertension moderately reduced right ventricular function. One would have to wonder about chronic sleep apnea although he says he doesn't snore. He'll review the echocardiogram with his cardiologist. 01/12/18; the patient arrives with the edema in both legs under exemplary control. He is using his compression pumps daily and sometimes twice daily. His wound on the right lateral leg is just about closed. He still has some weeping areas on the posterior left calf and lateral left calf although everything is just about closed here as well. I have spoken with Aldean Baker who is the patient's nurse practitioner and infectious disease. She was concerned that the patient had not understood that the parenteral penicillin injections he was receiving for cellulitis prophylaxis was actually benefiting him. I don't think the  patient actually saw that I would tend to agree we were certainly dealing with less infections although he had a serious one last month. 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 He is healed. He'll be placed in juxtalite compression wraps and increase his lymphedema pumps to twice daily. We will follow up again next week to ensure there are no issues with the new regiment. 01/20/18-he is here for evaluation of bilateral lower extremity weeping edema. Yesterday he was placed in compression wrap to the right lower extremity and compression stocking to left lower shrubbery. He states he uses lymphedema pumps last night and again this morning and noted a blister to the left lower extremity. On exam he was noted to have drainage to the right lower extremity. He will be placed in Unna boots bilaterally and follow-up next week 01/26/18; patient was actually discharged a week ago to his own juxta light stockings only to return the next day with bilateral lower extremity weeping edema.he was placed in bilateral Unna boots. He arrives today with pain in the back of his left leg. There is no open area on the right leg however there is a linear/wrap injury on the left leg and weeping edema on the left leg posteriorly. I spoke with  infectious disease about 10 days ago. They were disappointed that the patient elected to discontinue prophylactic intramuscular penicillin shots as they felt it was particularly beneficial in reducing the frequency of his cellulitis. I discussed this with the patient today. He does not share this view. He'll definitely need antibiotics today. Finally he is traveling to [place] and trauma leaving this Saturday and returning a week later and he does not travel with his pumps. He is going by car 01/30/18; patient was seen 4 days ago and brought back in today for review of cellulitis in the left leg posteriorly. I put him on amoxicillin this really  hasn't helped as much as I might like. He is also worried because he is traveling to [facility] trauma by car. Finally we will be rewrapping him. There is no open area on the right leg over his left leg has multiple weeping areas as usual 02/09/18; The same wrap on for 10 days. He did not pick up the last doxycycline I prescribed for him. He apparently took 4 days worth he already had. There is nothing open on his right leg and the edema control is really quite good. He's had damage in the left leg medially and laterally especially probably related to the prolonged use of Unna boots 02/12/18; the patient arrived in clinic today for a nurse visit/wrap change. He complained of a lot of pain in the left posterior calf. He is taking doxycycline that I previously prescribed for him. Unfortunately even though he used his stockings and apparently used to compression pumps twice a day he has weeping edema coming out of the lateral part of his right leg. This is coming from the lower anterior lateral skin area. 02/16/18; the patient has finished his doxycycline and will finish the amoxicillin 2 days. The area of cellulitis in the left calf posteriorly has resolved. He is no longer having any pain. He tells me he is using his compression pumps at least once a day sometimes twice. 02/23/18; the patient finished his doxycycline and Amoxil last week. On Friday he noticed a small erythematous circle about the size of a quarter on the left lower leg just above his ankle. This rapidly expanded and he now has erythema on the lateral and posterior part of the thigh. This is bright red. Also has an area on the dorsal foot just above his toes and a tender area just below the left popliteal fossa. He came off his prophylactic penicillin injections at his own insistence one or 2 months ago. This is obviously deteriorated since then 03/02/18; patient is on doxycycline and Amoxil. Culture I did last week of the weeping area on the  back of his left calf grew group B strep. I have therefore renewed the amoxicillin 500 3 times a day for a further week. He has not been systemically unwell. Still complaining of an area of discomfort right under his left popliteal fossa. There is no open wound on the right leg. He tells me that he is using his pumps twice a day on most days 03/09/18; patient arrives in clinic today completing his amoxicillin today. The cellulitis on his left leg is better. Furthermore he tells me that he had intramuscular penicillin shots that his primary care office today. However he also states that the wrap on his right leg fell down shortly after leaving clinic last week. He developed a large blister that was present when he came in for a nurse visit later in the week and  then he developed intense discomfort around this area.He tells me he is using his compression pumps 03/16/18; the patient has completed his doxycycline. The infectious part of this/cellulitis in the left heel area left popliteal area is a lot better. He has 2 open areas on the right calf. Still areas on the left calf but this is a lot better as well. 03/24/18; the patient arrives complaining of pain in the left popliteal area again. He thinks some of this is wrap injury. He has no open area on the right leg and really no open area on the left calf either except for the popliteal area. He claims to be compliant with the compression pumps 03/31/18; I gave him doxycycline last week because of cellulitis in the left popliteal area. This is a lot better although the surface epithelium is denuded off and response to this. He arrives today with uncontrolled edema in the right calf area as well as a fingernail injury in the right lateral calf. There is only a few open areas on the left 04/06/18; I gave him amoxicillin doxycycline over the last 2 weeks that the amoxicillin should be completing currently. He is not complaining of any pain or systemic symptoms.  The only open areas see has is on the right lateral lower leg paradoxically I cannot see anything on the left lower leg. He tells me he is using his compression pumps twice a day on most days. Silver alginate to the wounds that are open under 4 layer compression 04/13/18; he completed antibiotics and has no new complaints. Using his compression pumps. Silver alginate that anything that's opened 04/20/18; he is using his compression pumps religiously. Silver alginate 4 layer compression anything that's opened. He comes in today with no open wounds on the left leg but 3 on the right including a new one posteriorly. He has 2 on the right lateral and one on the right posterior. He likes Unna boots on the right leg for reasons that aren't really clear we had the usual 4 layer compression on the left. It may be necessary to move to the 4 layer compression on the right however for now I left them in the Unna boots 04/27/18; he is using his compression pumps at least once a day. He has still the wounds on the right lateral calf. The area right posteriorly has closed. He does not have an open wound on the left under 4 layer compression however on the dorsal left foot just proximal to the toes and the left third toe 2 small open areas were identified 05/11/18; he has not uses compression pumps. The areas on the right lateral calf have coalesced into one large wound necrotic surface. On the left side he has one small wound anteriorly however the edema is now weeping out of a large part of his left leg. He says he wasn't using his pumps because of the weeping fluid. I explained to him that this is the time he needs to pump more 05/18/18; patient states he is using his compression pumps twice a day. The area on the right lateral large wound albeit superficial. On the left side he has innumerable number of small new wounds on the left calf particularly laterally but several anteriorly and medially. All these appear  to have healthy granulated base these look like the remnants of blisters however they occurred under compression. The patient arrives in clinic today with his legs somewhat better. There is certainly less edema, less multiple open areas  on the left calf and the right anterior leg looks somewhat better as well superficial and a little smaller. However he relates pain and erythema over the last 3-4 days in the thigh and I looked at this today. He has not been systemically unwell no fever no chills no change in blood sugar values 05/25/18; comes in today in a better state. The severe cellulitis on his left leg seems better with the Keflex. Not as tender. He has not been systemically unwell ooHard to find an open wound on the left lower leg using his compression pumps twice a day ooThe confluent wounds on his right lateral calf somewhat better looking. These will ultimately need debridement I didn't do this today. 06/01/18; the severe cellulitis on the left anterior thigh has resolved and he is completed his Keflex. ooThere is no open wound on the left leg however there is a superficial excoriation at the base of the third toe dorsally. Skin on the bottom of his left foot is macerated looking. ooThe left the wounds on the lateral right leg actually looks some better although he did require debridement of the top half of this wound area with an open curet 06/09/18 on evaluation today patient appears to be doing poorly in regard to his right lower extremity in particular this appears to likely be infected he has very thick purulent discharge along with a bright green tent to the discharge. This makes me concerned about the possibility of pseudomonas. He's also having increased discomfort at this point on evaluation. Fortunately there does not appear to be any evidence of infection spreading to the other location at this time. 06/16/18 on evaluation today patient appears to actually be doing fairly well. His  ulcer has actually diminished in size quite significantly at this point which is good news. Nonetheless he still does have some evidence of infection he did see infectious disease this morning before coming here for his appointment. I did review the results of their evaluation and their note today. They did actually have him discontinue the Cipro and initiate treatment with linezolid at this time. He is doing this for the next seven days and they recommended a follow-up in four months with them. He is the keep a log of the need for intermittent antibiotic therapy between now and when he falls back up with infectious disease. This will help them gaze what exactly they need to do to try and help them out. 06/23/18; the patient arrives today with no open wounds on the left leg and left third toe healed. He is been using his compression pumps twice a day. On the right lateral leg he still has a sizable wound but this is a lot better than last time I saw this. 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 Has been using silver alginate under 4 layer compression 06/30/18; the only open wound he has is on the right lateral leg and this looks healthy. No debridement is required. We have been using silver alginate. He does not have an open wound on the left leg. There is apparently some drainage from the dorsal proximal third toe on the left although I see no open wound here. 07/03/18 on evaluation today patient was actually here just for a nurse visit rapid change. However when he was here on Wednesday for his rat change due to having been healed on the left and  then developing blisters we initiated the wrap again knowing that he would be back today for [place] to reevaluate and see were at. Unfortunately he has developed some cellulitis into the proximal portion of his right lower extremity even into the region of his  thigh. 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 He was placed on one as what at that point. Nonetheless he is done with that and has been tolerating it well otherwise. Doxycycline which in the past really did not seem to be effective for him. Nonetheless I think the best option may be for [place] to definitely reinitiate the antibiotics for a longer period of time. 07/07/18; since I last saw this patient a week ago he has had a difficult time. At that point he did not have an open wound on his left leg. We transitioned him into juxta light stockings. He was apparently in the clinic the next day with blisters on the left lateral and left medial lower calf. He also had weeping edema fluid. He was put back into a compression wrap. He was also in the clinic on Friday with intense erythema in his right thigh. Per the patient he was started on Bactrim however that didn't work at all in terms of relieving his pain and swelling. He has taken 3 doxycycline that he had left over from last time and that seems to of helped. He has blistering on the right thigh as well. 07/14/18; the erythema on his right thigh has gotten better with doxycycline that he is finishing. 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 Presumably this cellulitis in the thigh was not related to that although I think this is a bit concerning going forward. He still has an area on the right lateral calf the blister on the right medial calf just below the knee that was discussed above. On the left 2 small open areas left medial and left lateral. Edema control is adequate. He is using his compression pumps twice a day 07/20/18; continued improvement in the condition of both legs especially the edema in his bilateral thighs. He tells me he is been losing weight through a combination of diet and exercise. He is using his compression pumps  twice a day. So overall she made to the remaining wounds 07/27/2018; continued improvement in condition of both legs. His edema is well controlled. The area on the right lateral leg is just about closed he had one blisters show up on the medial left upper calf. We have him in 4 layer compression. He is going on a 10-day trip to [place], [place] and [place]. He will be driving. He wants to wear Unna boots because of the lessening amount of constriction. He will not use compression pumps while he is away 08/05/18 on evaluation today patient actually appears to be doing decently well all things considered in regard to his bilateral lower extremities. The worst ulcer is actually only posterior aspect of his left lower extremity with a four layer compression wrap cut into his leg a couple weeks back. He did have a trip and actually had [employer] for the trip that he is worn since he was last here. Nonetheless he feels like the [employer] actually do better for him his swelling is up a little bit but he also with his trip was not taking his  Lasix on a regular set schedule like he was supposed to be. He states that obviously the reason being that he cannot drive and keep going without having to urinate too frequently which makes it difficult. He did not have his pumps with him while he was away either which I think also maybe playing a role here too. 08/13/2018; the patient only has a small open wound on the right lateral calf which is a big improvement in the last month or 2. He also has the area posteriorly just below the posterior fossa on the left which I think was a wrap injury from several weeks ago. He has no current evidence of cellulitis. He tells me he is back into his compression pumps twice a day. He also tells me that while he was at the laundromat somebody stole a section of his extremitease stockings 08/20/2018; back in the clinic with a much improved state. He only has small  areas on the right lateral mid calf which is just about healed. This was is more substantial area for quite a prolonged period of time. He has a small open area on the left anterior tibia. The area on the posterior calf just below the popliteal fossa is closed today. He is using his compression pumps twice a day 08/28/2018; patient has no open wound on the right leg. He has a smattering of open areas on the calf with some weeping lymphedema. More problematically than that it looks as though his wraps of slipped down in his usual he has very angry upper area of edema just below the right medial knee and on the right lateral calf. He has no open area on his feet. The patient is traveling to [facility] next week. I will send him in an antibiotic. We will continue to wrap the right leg. We ordered extremitease stockings for him last week and I plan to transition the right leg to a stocking when he gets home which will be in 10 days time. As usual he is very reluctant to take his pumps with him when he travels 09/07/2018; patient returns from [facility]. He shows me a picture of his left leg in the mid part of his trip last week with intense fire engine erythema. The picture look bad enough I would have considered sending him to the hospital. Instead he went to the wound care center in [facility]. They did not prescribe him antibiotics but he did take some doxycycline he had leftover from a previous visit. I had given him trimethoprim sulfamethoxazole before he left this did not work according to the patient. This is resulted in some improvement fortunately. He comes back with a large wound on the left posterior calf. Smaller area on the left anterior tibia. Denuded blisters on the dorsal left foot over his toes. Does not have much in the way of wounds on the right leg although he does have a very tender area on the right posterior area just below the popliteal fossa also suggestive of  infection. He promises me he is back on his pumps twice a day 09/15/2018; the intense cellulitis in his left lower calf is a lot better. The wound area on the posterior left calf is also so better. However he has reasonably extensive wounds on the dorsal aspect of his second and third toes and the proximal foot just at the base of the toes. There is nothing open on the right leg 09/22/2018; the patient has excellent edema  control in his legs bilaterally. He is using his external compression pumps twice a day. He has no open area on the right leg and only the areas in the left foot dorsally second and third toe area on the left side. He does not have any signs of active cellulitis. 10/06/2018; the patient has good edema control bilaterally. He has no open wound on the right leg. There is a blister in the posterior aspect of his left calf that we had to deal with today. He is using his compression pumps twice a day. There is no signs of active cellulitis. We have been using silver alginate to the wound areas. He still has vulnerable areas on the base of his left first second toes dorsally He has a his extremities stockings and we are going to transition him today into the stocking on the right leg. He is cautioned that he will need to continue to use the compression pumps twice a day. If he notices uncontrolled edema in the right leg he may need to go to 3 times a day. 10/13/2018; the patient came in for a nurse check on Friday he has a large flaccid blister on the right medial calf just below the knee. We unroofed this. He has this and a new area underneath the posterior mid calf which was undoubtedly a blister as well. He also has several small areas on the right which is the area we put his extremities stocking on. 10/19/2018; the patient went to see infectious disease this morning I am not sure if that was a routine follow-up in any case the doxycycline I had given him was discontinued and started on  linezolid. He has not started this. It is easy to look at his left calf and the inflammation and think this is cellulitis however he is very tender in the tissue just below the popliteal fossa and I have no doubt that there is infection going on here. He states the problem he is having is that with the compression pumps the edema goes down and then starts walking the wrap falls down. We will see if we can adhere this. He has 1 or 2 minuscule open areas on the right still areas that are weeping on the posterior left calf, the base of his left second and third toes 10/26/18; back today in clinic with quite of skin breakdown in his left anterior leg. This may have been infection the area below the popliteal fossa seems a lot better however tremendous epithelial loss on the left anterior mid tibia area over quite inexpensive tissue. He has 2 blisters on the right side but no other open wound here. 10/29/2018; came in urgently to see [place] today and we worked him in for review. He states that the 4 layer compression on the right leg caused pain he had to cut it down to roughly his mid calf this caused swelling above the wrap and he has blisters and skin breakdown today. As a result of the pain he has not been using his pumps. Both legs are a lot more edematous and there is a lot of weeping fluid. 11/02/18; arrives in clinic with continued difficulties in the right leg> left. Leg is swollen and painful. multiple skin blisters and new open areas especially laterally. He has not been using his pumps on the right leg. 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 He is not systemically unwell. 11/09/2018; the patient claims he is  being compliant with his pumps. He is finished the doxycycline I gave him last week. Culture I did of the wound on the right lateral leg showed a few very resistant methicillin staph aureus. This was resistant to doxycycline. Nevertheless he states  the pain in the leg is a lot better which makes me wonder if the cultured organism was not really what was causing the problem nevertheless this is a very dangerous organism to be culturing out of any wound. His right leg is still a lot larger than the left. He is using an Radio broadcast assistant on this area, he blames a 4-layer compression for causing the original skin breakdown which I doubt is true however I cannot talk him out of it. We have been using silver alginate to all of these areas which were initially blisters 11/16/2018; patient is being compliant with his external compression pumps at twice a day. Miraculously he arrives in clinic today with absolutely no open wounds. He has better edema control on the left where he has been using 4 layer compression versus wound of wounds on the right and I pointed this out to him. There is no inflammation in the skin in his lower legs which is also somewhat unusual for him. There is no open wounds on the dorsal left foot. He has extremitease stockings at home and I have asked him to bring these in next week. 11/25/18 patient's lower extremity on examination today on the left appears for the most part to be wound free. He does have an open wound on the lateral aspect of the right lower extremity but this is minimal compared to what I've seen in past. He does request that we go ahead and wrap the left leg as well even though there's nothing open just so hopefully it will not reopen in short order. 1/28; patient has superficial open wounds on the right lateral calf left anterior calf and left posterior calf. His edema control is adequate. 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 We have been using silver alginate as the primary dressing. He claims compliance with his compression pumps 2/4; patient has superficial open wounds on numerous areas of his left calf and again one on the  left dorsal foot. The areas on the right lateral calf have healed. The cellulitis that I gave him doxycycline for last week is also resolved this was mostly on the left anterior calf just below the tibial tuberosity. His edema looks fairly well-controlled. He tells me he went to see his primary doctor today and had blood work ordered 2/11; once again he has several open areas on the left calf left tibial area. Most of these are small and appear to have healthy granulation. He does not have anything open on the right. The edema and control in his thighs is pretty good which is usually a good indication he has been using his pumps as requested. 2/18; he continues to have several small areas on the left calf and left tibial area. Most of these are small healthy granulation. We put him in his stocking on the right leg last week and he arrives with a superficial open area over the right upper tibia and a fairly large area on the right lateral tibia in similar condition. His edema control actually does not look too bad, he claims to be using his compression pumps twice a day 2/25. Continued small areas on  the left calf and left tibial area. New areas especially on the right are identified just below the tibial tuberosity and on the right upper tibia itself. There are also areas of weeping edema fluid even without an obvious wound. He does not have a considerable degree of lymphedema but clearly there is more edema here than his skin can handle. He states he is using the pumps twice a day. We have an Unna boot on the right and 4 layer compression on the left. 3/3; he continues to have an area on the right lateral calf and right posterior calf just below the popliteal fossa. There is a fair amount of tenderness around the wound on the popliteal fossa but I did not see any evidence of cellulitis, could just be that the wrap came down and rubbed in this area. ooHe does not have an open area on the left leg  however there is an area on the left dorsal foot at the base of the third toe ooWe have been using silver alginate to all wound areas 3/10; he did not have an open area on his left leg last time he was here a week ago. T oday he arrives with a horizontal wound just below the tibial tuberosity and an area on the left lateral calf. He has intense erythema and tenderness in this area. The area is on the right lateral calf and right posterior calf better than last week. We have been using silver alginate as usual 3/18 - Patient returns with 3 small open areas on left calf, and 1 small open area on right calf, the skin looks ok with no significant erythema, he continues the UNA boot on right and 4 layer compression on left. The right lateral calf wound is closed , the right posterior is small area. we will continue silver alginate to the areas. 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 He is been tolerating the dressing changes without complication. Fortunately she has made excellent improvement in regard to the overall status of his wounds. Unfortunately every time we cease wrapping him he ends up reopening in causing more significant issues at that point. Again I'm unsure of the best direction to take although I think the lymphedema clinic may be appropriate for him. 02/03/19 on evaluation today patient appears to be doing well in regard to the wounds that we saw him for last week unfortunately he has a new area on the proximal portion of his right medial/posterior lower extremity where the wrap somewhat slowed down and caused swelling and a blister to rub and open. Unfortunately this is the only opening that he has on either leg at this point. 02/17/19 on evaluation today patient's bilateral lower extremities  appear to be doing well. He still completely healed in regard to the left lower extremity. In regard to the right lower extremity the area where the wrap and slid down and caused the blister still seems to be slightly open although this is dramatically better than during the last evaluation two weeks ago. I'm very pleased with the way this stands overall. 03/03/19 on evaluation today patient appears to be doing well in regard to his right lower extremity in general although he did have a new blister open this does not appear to be showing any evidence of active  infection at this time. Fortunately there's No fevers, chills, nausea, or vomiting noted at this time. Overall I feel like he is making good progress it does feel like that the right leg will we perform the [employer] seems to do with a bit better than three layer wrap on the left which slid down on him. We may switch to doing bilateral in the book wraps. 5/4; I have not seen Mr. Kluth in quite some time. According to our case manager he did not have an open wound on his left leg last week. He had 1 remaining wound on the right posterior medial calf. He arrives today with multiple openings on the left leg probably were blisters and/or wrap injuries from Unna boots. I do not think the Unna boot's will provide adequate compression on the left. I am also not clear about the frequency he is using the compression pumps. 03/17/19 on evaluation today patient appears to be doing excellent in regard to his lower extremities compared to last week's evaluation apparently. He had gotten significantly worse last week which is unfortunate. The [employer] wrap on the left did not seem to do very well for him at all and in fact it didn't control his swelling significantly enough he had an additional outbreak. Subsequently we go back to the four layer compression wrap on the left. This is good news. At least in that he is doing better and the wound seem to be  killing him. He still has not heard anything from the lymphedema clinic. 03/24/19 on evaluation today patient actually appears to be doing much better in regard to his bilateral lower Trinity as compared to last week when I saw him. Fortunately there's no signs of active infection at this time. He has been tolerating the dressing changes without complication. Overall I'm extremely pleased with the progress and appearance in general. 04/07/19 on evaluation today patient appears to be doing well in regard to his bilateral lower extremities. His swelling is significantly down from where it was previous. With that being said he does have a couple blisters still open at this point but fortunately nothing that seems to be too severe and again the majority of the larger openings has healed at this time. 04/14/19 on evaluation today patient actually appears to be doing quite well in regard to his bilateral lower extremities in fact I'm not even sure there's anything significantly open at this time at any site. Nonetheless he did have some trouble with these wraps where they are somewhat irritating him secondary to the fact that he has noted that the graph wasn't too close down to the end of this foot in a little bit short as well up to his knee. Otherwise things seem to be doing quite well. 04/21/19 upon evaluation today patient's wound bed actually showed evidence of being completely healed in regard to both lower extremities which is excellent news. There does not appear to be any signs of active infection which is also good news. I'm very pleased in this regard. No fevers, chills, nausea, or vomiting noted at this time. 04/28/19 on evaluation today patient appears to be doing a little bit worse in regard to both lower extremities on the left mainly due to the fact that when he went infection disease the wrap was not wrapped quite high enough he developed a blister above this. On the right he is a small open area  of nothing too significant but again this is continuing to give  him some trouble he has been were in the Velcro compression that he has at home. 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 He's been tolerating the dressing changes without complication. Fortunately there's no signs of active infection at this time. No fevers, chills, nausea, or vomiting noted at this time. We have been trying to get an appointment with her lymphedema clinic in [facility] but unfortunately nobody can get them on phone with not been able to even fax information over the patient likewise is not been able to get in touch with them. Overall I'm not sure exactly what's going on here with to reach out again today. 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 Still having a lot of drainage unfortunately. He tells me especially in the left but even on the right. There's no signs of active infection which is good news we've been using so ratcheted up to this point. 05/19/19 on evaluation today patient actually appears to be doing quite well with regard to his left lower extremity which is great news. Fortunately in regard to the right lower extremity has an issues with his wrap and he subsequently did remove this from what I'm understanding. Nonetheless long story short is what he had rewrapped once he removed it subsequently had maggots underneath this wrap whenever he came in for evaluation today. With that being said they were obviously completely cleaned away by the nursing staff. The visit today which is excellent news. However he does appear to potentially have some infection around the right ankle region where the maggots were located as well. He will likely require anabiotic therapy today. 05/26/19 on evaluation today patient actually appears to be doing much better  in regard to his bilateral lower extremities. I feel like the infection is under much better control. With that being said there were maggots noted when the wrap was removed yet again today. Again this could have potentially been left over from previous although at this time there does not appear to be any signs of significant drainage there was obviously on the wrap some drainage as well this contracted gnats or otherwise. Either way I do not see anything that appears to be doing worse in my pinion and in fact I think his drainage has slowed down quite significantly likely mainly due to the fact to his infection being under better control. 06/02/2019 on evaluation today patient actually appears to be doing well with regard to his bilateral lower extremities there is no signs of active infection at this time which is great news. With that being said he does have several open areas more so on the right than the left but nonetheless these are all significantly better than previously noted. 06/09/2019 on evaluation today patient actually appears to be doing well. His wrap stayed up and he did not cause any problems he had more drainage on the right compared to the left but overall I do not see any major issues at this time which is great news. 06/16/2019 on evaluation today patient appears to be doing excellent with regard to his lower extremities the only area that is open is a new blister that can have opened as of today on the medial ankle on the left. Other than this he really seems to be doing great I see no major issues at this point. 06/23/2019 on evaluation  today patient appears to be doing quite well with regard to his bilateral lower extremities. In fact he actually appears to be almost completely healed there is a small area of weeping noted of the right lower extremity just above the ankle. Nonetheless fortunately there is no signs of active infection at this time which is good news. No fevers,  chills, nausea, vomiting, or diarrhea. 8/24; the patient arrived for a nurse visit today but complained of very significant pain in the left leg and therefore I was asked to look at this. Noted that he did not have an open area on the left leg last week nevertheless this was wrapped. The patient states that he is not been able to put his compression pumps on the left leg because of the discomfort. He has not been systemically unwell 06/30/2019 on evaluation today patient unfortunately despite being excellent last week is doing much worse with regard to his left lower extremity today. In fact he had to come in for a nurse on Monday where his left leg had to be rewrapped due to excessive weeping Dr. Leanord Hawking placed him on doxycycline at that point. Fortunately there is no signs of active infection Systemically at this time which is good news. 07/07/2019 in regard to the patient's wounds today he actually seems to be doing well with his right lower extremity there really is nothing open or draining at this point this is great news. Unfortunately the left lower extremity is given him additional trouble at this time. There does not appear to be any signs of active infection nonetheless he does have a lot of edema and swelling noted at this point as well as blistering all of which has led to a much more poor appearing leg at this time compared to where it was 2 weeks ago when it was almost completely healed. Obviously this is a little discouraging for the patient. He is try to contact the lymphedema clinic in [place] he has not been able to get through to them. 07/14/2019 on evaluation today patient actually appears to be doing slightly better with regard to his left lower extremity ulcers. Overall I do feel like at least at the top of the wrap that we have been placing this area has healed quite nicely and looks much better. The remainder of the leg is showing signs of improvement. Unfortunately in the thigh  area he still has an open region on the left and again on the right he has been utilizing just a Band-Aid on an area that also opened on the thigh. Again this is an area that were not able to wrap although we did do an Ace wrap to provide some compression that something that obviously is a little less effective than the compression wraps we have been using on the lower portion of the leg. He does have an appointment with the lymphedema clinic in [facility] on Friday. 07/21/2019 on evaluation today patient appears to be doing better with regard to his lower extremity ulcers. He has been tolerating the dressing changes without complication. Fortunately there is no signs of active infection at this time. No fevers, chills, nausea, vomiting, or diarrhea. I did receive the paperwork from the physical therapist at the lymphedema clinic in [place]. Subsequently I signed off on that this morning and sent that back to him for further progression with the treatment plan. 07/28/2019 on evaluation today patient appears to be doing very well with regard to his right lower extremity where I  do not see any open wounds at this point. Fortunately he is feeling great as far as that is concerned as well. In regard to the left lower extremity he has been having issues with still several areas of weeping and edema although the upper leg is doing better his lower leg still I think is going require the compression wrap at this time. No fevers, chills, nausea, vomiting, or diarrhea. 08/04/2019 on evaluation today patient unfortunately is having new wounds on the right lower extremity. Again we have been using Unna boot wrap on that side. We switched him to using his juxta lite wrap at home. With that being said he tells me he has been using it although his legs extremely swollen and to be honest really does not appear that he has been. I cannot know that for sure however. Nonetheless he has multiple new wounds on the  right lower extremity at this time. Obviously we will have to see about getting this rewrapped for him today. 08/11/2019 on evaluation today patient appears to be doing fairly well with regard to his wounds. He has been tolerating the dressing changes including the compression wraps without complication. He still has a lot of edema in his upper thigh regions bilaterally he is supposed to be seeing the lymphedema clinic on the 15th of this month once his wraps arrive for the upper part of his legs. 08/18/2019 on evaluation today patient appears to be doing well with regard to his bilateral lower extremities at this point. He has been tolerating the dressing changes without complication. Fortunately there is no signs of active infection which is also good news. He does have a couple weeping areas on the first and second toe of the right foot he also has just a small area on the left foot upper leg and a small area on the left lower leg but overall he is doing quite well in my opinion. He is supposed to be getting his wraps shortly in fact tomorrow and then subsequently is seeing the lymphedema clinic next Wednesday on the 21st. Of note he is also leaving on the 25th to go on vacation for a week to the beach. For that reason and since there is some uncertainty about what there can be doing at lymphedema clinic next Wednesday I am get a make an appointment for next Friday here for [place] to see what we need to do for him prior to him leaving for vacation. 10/23; patient arrives in considerable pain predominantly in the upper posterior calf just distal to the popliteal fossa also in the wound anteriorly above the major wound. This is probably cellulitis and he has had this recurrently in the past. He has no open wound on the right side and he has had an Radio broadcast assistant in that area. Finally I note that he has an area on the left posterior calf which by enlarge is mostly epithelialized. This protrudes beyond the  borders of the surrounding skin in the setting of dry scaly skin and lymphedema. The patient is leaving for [facility] on Sunday. Per his longstanding pattern, he will not take his compression pumps with him predominantly out of fear that they will be stolen. He therefore asked that we put a Unna boot back on the right leg. He will also contact the wound care center in [facility] to see if they can change his dressing in the mid week. 11/3; patient returned from his vacation to [facility]. He was seen  on 1 occasion at their wound care center. They did a 2 layer compression system as they did not have our 4-layer wrap. I am not completely certain what they put on the wounds. They did not change the Unna boot on the right. The patient is also seeing a lymphedema specialist physical therapist in [place]. It appears that he has some compression sleeve for his thighs which indeed look quite a bit better than I am used to seeing. He pumps over these with his external compression pumps. 11/10; the patient has a new wound on the right medial thigh otherwise there is no open areas on the right. He has an area on the left leg posteriorly anteriorly and medially and an area over the left second toe. We have been using silver alginate. He thinks the injury on his thigh is secondary to friction from the compression sleeve he has. 11/17; the patient has a new wound on the right medial thigh last week. He thinks this is because he did not have a underlying stocking for his thigh juxta lite apparatus. He now has this. The area is fairly large and somewhat angry but I do not think he has underlying cellulitis. ooHe has a intact blister on the right anterior tibial area. ooSmall wound on the right great toe dorsally ooSmall area on the medial left calf. 11/30; the patient does not have any open areas on his right leg and we did not take his juxta lite stocking off. However he states that on Friday  his compression wrap fell down lodging around his upper mid calf area. As usual this creates a lot of problems for him. He called urgently today to be seen for a nurse visit however the nurse visit turned into a provider visit because of extreme erythema and pain in the left anterior tibia extending laterally and posteriorly. The area that is problematic is extensive 10/06/2019 upon evaluation today patient actually appears to be doing poorly in regard to his left lower extremity. He Dr. Leanord Hawking did place him on doxycycline this past Monday apparently due to the fact that he was doing much worse in regard to this left leg. Fortunately the doxycycline does seem to be helping. Unfortunately we are still having a very difficult time getting his edema under any type of control in order to anticipate discharge at some point. The only way were really able to control his lymphedema really is with compression wraps and that has only even seemingly temporary. He has been seeing a lymphedema clinic they are trying to help in this regard but still this has been somewhat frustrating in general for the patient. 10/13/19 on evaluation today patient appears to be doing excellent with regard to his right lower extremity as far as the wounds are concerned. His swelling is still quite extensive unfortunately. He is still having a lot of drainage from the thigh areas bilaterally which is unfortunate. He's been going to lymphedema clinic but again he still really does not have this edema under control as far as his lower extremities are concern. With regard to his left lower extremity this seems to be improving and I do believe the doxycycline has been of benefit for him. He is about to complete the doxycycline. 10/20/2019 on evaluation today patient appears to be doing poorly in regard to his bilateral lower extremities. More in the right thigh he has a lot of irritation at this site unfortunately. In regard to the left  lower extremity the wrap  was not quite as high it appears and does seem to have caused him some trouble as well. Fortunately there is no evidence of systemic infection though he does have some blue-green drainage which has me concerned for the possibility of Pseudomonas. He tells me he is previously taking Cipro without complications and he really does not care for Levaquin however due to some of the side effects he has. He is not allergic to any medications specifically antibiotics that were aware of. 10/27/2019 on evaluation today patient actually does appear to be for the most part doing better when compared to last week's evaluation. With that being said he still has multiple open wounds over the bilateral lower extremities. He actually forgot to start taking the Cipro and states that he still has the whole bottle. He does have several new blisters on left lower extremity today I think I would recommend he go ahead and take the Cipro based on what I am seeing at this point. 12/30-Patient comes at 1 week visit, 4 layer compression wraps on the left and Unna boot on the right, primary dressing Xtrasorb and silver alginate. Patient is taking his Cipro and has a few more days left probably 5-6, and the legs are doing better. He states he is using his compressions devices which I believe he has 11/10/2019 on evaluation today patient actually appears to be much better than last time I saw him 2 weeks ago. His wounds are significantly improved and overall I am very pleased in this regard. Fortunately there is no signs of active infection at this time. He is just a couple of days away from completing Cipro. Overall his edema is much better he has been using his lymphedema pumps which I think is also helping at this point. 11/17/2019 on evaluation today patient appears to be doing excellent in regard to his wounds in general. His legs are swollen but not nearly as much as they have been in the past.  Fortunately he is tolerating the compression wraps without complication. No fevers, chills, nausea, vomiting, or diarrhea. He does have some erythema however in the distal portion of his right lower extremity specifically around the forefoot and toes there is a little bit of warmth here as well. 11/24/2019 on evaluation today patient appears to be doing well with regard to his right lower extremity I really do not see any open wounds at this point. His left lower extremity does have several open areas and his right medial thigh also is open. Other than this however overall the patient seems to be making good progress and I am very pleased at this point. 12/01/2019 on evaluation today patient appears to be doing poorly at this point in regard to his left lower extremity has several new blisters despite the fact that we have him in compression wraps. In fact he had a 4-layer compression wrap, his upper thigh wrapped from lymphedema clinic, and a juxta light over top of the 4 layer compression wrap the lymphedema clinic applied and despite all this he still develop blisters underneath. Obviously this does have me concerned about the fact that unfortunately despite what we are doing to try to get wounds healed he continues to have new areas arise I do not think he is ever good to be at the point where he can realistically just use wraps at home to keep things under control. Typically when we heal him it takes about 1-2 days before he is back in the clinic with  severe breakdown and blistering of his lower extremities bilaterally. This is happened numerous times in the past. Unfortunately I think that we may need some help as far as overall fluid overload to kind of limit what we are seeing and get things under better control. 12/08/2019 on evaluation today patient presents for follow-up concerning his ongoing bilateral lower extremity edema. Unfortunately he is still having quite a bit of swelling the  compression wraps are controlling this to some degree but he did see Dr. [name] his cardiologist I do have that available for review today as far as the appointment was concerned that was on 12/06/2019. Obviously that she has been 2 days ago. The patient states that he is only been taking the Lasix 80 mg 1 time a day he had told me previously he was taking this twice a day. Nonetheless Dr. [name] recommended this be up to 80 mg 2 times a day for the patient as he did appear to be fluid overloaded. With that being said the patient states he did this yesterday and he was unable to go anywhere or do anything due to the fact that he was constantly having to urinate. Nonetheless I think that this is still good to be something that is important for him as far as trying to get his edema under control at all things that he is going to be able to just expect his wounds to get under control and things to be better without going through at least a period of time where he is trying to stabilize his fluid management in general and I think increasing the Lasix is likely the first step here. It was also mentioned the possibility that the patient may require metolazone. With that being said he wanted to have the patient take Lasix twice a day first and then reevaluating 2 months to see where things stand. 12/15/2019 upon evaluation today patient appears to be doing regard to his legs although his toes are showing some signs of weeping especially on the left at this point to some degree on the right. There does not appear to be any signs of active infection and overall I do feel like the compression wraps are doing well for him but he has not been able to take the Lasix at home and the increased dose that Dr. [name] recommended. He tells me that just not go to be feasible for him. Nonetheless I think in this case he should probably send a message to Dr. [name] in order to discuss options from the standpoint of possible  admission to get the fluid off or otherwise going forward. 12/22/2019 upon evaluation today patient appears to be doing fairly well with regard to his lower extremities at this point. In fact he would be doing excellent if it was not for the fact that his right anterior thigh apparently had an allergic reaction to adhesive tape that he used. The wound itself that we have been monitoring actually appears to be healed. There is a lot of irritation at this point. 12/29/2019 upon evaluation today patient appears to be doing well in regard to his lower extremities. His left medial thigh is open and somewhat draining today but this is the only region that is open the right has done much better with the treatment utilizing the steroid cream that I prescribed for him last week. Overall I am pleased in that regard. Fortunately there is no signs of active infection at this time. No fevers, chills, nausea, vomiting,  or diarrhea. 01/05/2020 upon evaluation today patient appears to be doing more poorly in regard to his right lower extremity at this point upon evaluation today. Unfortunately he continues to have issues in this regard and I think the biggest issue is controlling his edema. This obviously is not very well controlled at this point is been recommended that he use the Lasix twice a day but he has not been able to do that. Unfortunately I think this is leading to an issue where honestly he is not really able to effectively control his edema and therefore the wounds really are not doing significantly better. I do not think that he is going to be able to keep things under good control unless he is able to control his edema much better. I discussed this again in great detail with him today. 01/12/2020 good news is patient actually appears to be doing quite well today at this point. He does have an appointment with lymphedema clinic tomorrow. His legs appear healed and the toe on the left is almost completely  healed. In general I am very pleased with how things stand at this point. 01/19/2020 upon evaluation today patient appears to actually be doing well in regard to his lower extremities there is nothing open at this point. Fortunately he has done extremely well more recently. Has been seeing lymphedema clinic as well. With that being said he has Velcro wraps for his lower legs as well as his upper legs. The only wound really is on his toe which is the right great toe and this is barely anything even there. With all that being said I think it is good to be appropriate today to go ahead and switch him over to the Velcro compression wraps. 01/26/2020 upon evaluation today patient appears to be doing worse with regard to his lower extremities after last week switch him to Velcro compression wraps. Unfortunately he lasted less than 24 hours he did not have the sock portion of his Velcro wrap on the left leg and subsequently developed a blister underneath the Velcro portion. Obviously this is not good and not what we were looking for at this point. He states the lymphedema clinic did tell him to wear the wrap for 23 hours and take him off for 1 I am okay with that plan but again right now we got a get things back under control again he may have some cellulitis noted as well. 02/02/2020 upon evaluation today patient unfortunately appears to have several areas of blistering on his bilateral lower extremities today mainly on the feet. His legs do seem to be doing somewhat better which is good news. Fortunately there is no evidence of active infection at this time. No fevers, chills, nausea, vomiting, or diarrhea. 02/16/2020 upon evaluation today patient appears to be doing well at this time with regard to his legs. He has a couple weeping areas on his toes but for the most part everything is doing better and does appear to be sealed up on his legs which is excellent news. We can continue with wrapping him at this  point as he had every time we discontinue the wraps he just breaks out with new wounds. There is really no point in is going forward with this at this point. 03/08/2020 upon evaluation today patient actually appears to be doing quite well with regard to his lower extremity ulcers. He has just a very superficial and really almost nonexistent blister on the left lower extremity he has  in general done very well with the compression wraps. With that being said I do not see any signs of infection at this time which is good news. 03/29/2020 upon evaluation today patient appears to be doing well with regard to his wounds currently except for where he had several new areas that opened up due to some of the wrap slipping and causing him trouble. He states he did not realize they had slipped. Nonetheless he has a 1 area on the right and 3 new areas on the left. Fortunately there is no signs of active infection at this time which is great news. 04/05/2020 upon evaluation today patient actually appears to be doing quite well in general in regard to his legs currently. Fortunately there is no signs of active infection at this time. No fevers, chills, nausea, vomiting, or diarrhea. He tells me next week that he will actually be seen in the lymphedema clinic on Thursday at 10 AM I see him on Wednesday next week. 04/12/2020 upon evaluation today patient appears to be doing very well with regard to his lower extremities bilaterally. In fact he does not appear to have any open wounds at this point which is good news. Fortunately there is no signs of active infection at this time. No fevers, chills, nausea, vomiting, or diarrhea. 04/19/2020 upon evaluation today patient appears to be doing well with regard to his wounds currently on the bilateral lower extremities. There does not appear to be any signs of active infection at this time. Fortunately there is no evidence of systemic infection and overall very pleased at this point.  Nonetheless after I held him out last week he literally had blisters the next morning already which swelled up with him being right back here in the clinic. Overall I think that he is just not can be able to be discharged with his legs the way they are he is much to volume overloaded as far as fluid is concerned and that was discussed with him today of also discussed this but should try the clinic nurse manager as well as Dr. Leanord Hawking. 04/26/2020 upon evaluation today patient appears to be doing better with regard to his wounds currently. He is making some progress and overall swelling is under good control with the compression wraps. Fortunately there is no evidence of active infection at this time. 05/10/2020 on evaluation today patient appears to be doing overall well in regard to his lower extremities bilaterally. He is Tolerating the compression wraps without complication and with what we are seeing currently I feel like that he is making excellent progress. There is no signs of active infection at this time. 05/24/2020 upon evaluation today patient appears to be doing well in regard to his legs. The swelling is actually quite a bit down compared to where it has been in the past. Fortunately there is no sign of active infection at this time which is also good news. With that being said he does have several wounds on his toes that have opened up at this point. 05/31/2020 upon evaluation today patient appears to be doing well with regard to his legs bilaterally where he really has no significant fluid buildup at this point overall he seems to be doing quite well. Very pleased in this regard. With regard to his toes these also seem to be drying up which is excellent. We have continue to wrap him as every time we tried as a transition to the juxta light wraps things just do not  seem to get any better. 06/07/2020 upon evaluation today patient appears to be doing well with regard to his right leg at this point.  Unfortunately left leg has a lot of blistering he tells me the wrap started to slide down on him when he tried to put his other Velcro wrap over top of it to help keep things in order but nonetheless still had some issues. 06/14/2020 on evaluation today patient appears to be doing well with regard to his lower extremity ulcers and foot ulcers at this point. I feel like everything is actually showing signs of improvement which is great news overall there is no signs of active infection at this time. No fevers, chills, nausea, vomiting, or diarrhea. 06/21/2020 on evaluation today patient actually appears to be doing okay in regard to his wounds in general. With that being said the biggest issue I see is on his right foot in particular the first and second toe seem to be doing a little worse due to the fact this is staying very wet. I think he is probably getting need to change out his dressings a couple times in between each week when we see him in regard to his toes in order to keep this drier based on the location and how this is proceeding. 06/28/2020 on evaluation today patient appears to be doing a little bit more poorly overall in regard to the appearance of the skin I am actually somewhat concerned about the possibility of him having a little bit of an infection here. We discussed the course of potentially giving him a doxycycline prescription which he is taken previously with good result. With that being said I do believe that this is potentially mild and at this point easily fixed. I just do not want anything to get any worse. 07/12/2020 upon evaluation today patient actually appears to be making some progress with regard to his legs which is great news there does not appear to be any evidence of active infection. Overall very pleased with where things stand. 07/26/2020 upon evaluation today patient appears to be doing well with regard to his leg ulcers and toe ulcers at this point. He has been  tolerating the compression wraps without complication overall very pleased in this regard. 08/09/2020 upon evaluation today patient appears to be doing well with regard to his lower extremities bilaterally. Fortunately there is no signs of active infection overall I am pleased with where things stand. 08/23/2020 on evaluation today patient appears to be doing well with regard to his wound. He has been tolerating the dressing changes without complication. Fortunately there is no signs of active infection at this time. Overall his legs seem to be doing quite well which is great news and I am very pleased in that regard. No fevers, chills, nausea, vomiting, or diarrhea. 09/13/2020 upon evaluation today patient appears to be doing okay in regard to his lower extremities. He does have a fairly large blister on the right leg which I did remove the blister tissue from today so we can get this to dry out other than that however he seems to be doing quite well. There is no signs of active infection at this time. 09/27/2020 upon evaluation today patient appears to actually be doing some better in regard to his right leg. Fortunately signs of active infection at this time which is great news. No fevers, chills, nausea, vomiting, or diarrhea. 10/04/2020 upon evaluation today patient actually appears to be showing signs of improvement which is  great news with regard to his leg ulcers. Fortunately there is no signs of active infection which is great news he is still taking the antibiotics currently. No fevers, chills, nausea, vomiting, or diarrhea. 10/18/2020 on evaluation today patient appears to be doing well with regard to his legs currently. He has been tolerating the dressing changes including the wraps without complication. Fortunately there is no signs of active infection at this time. No fevers, chills, nausea, vomiting, or diarrhea. 10/25/2020 upon evaluation today patient appears to be doing decently well  in regard to his wounds currently. He has been tolerating the dressing changes without complication. Overall I feel like he is making good progress albeit slow. Again this is something we can have to continue to wrap for some time to come most likely. 11/08/2020 upon evaluation today patient appears to be doing well with regard to his wounds currently. He has been tolerating the dressing changes without complication is not currently on any antibiotics and he does not appear to show any signs of infection. He does continue to have a lot of drainage on the right leg not too severe but nonetheless this is very scattered. On the left leg this is looking to be much improved overall. 11/15/2020 upon evaluation today patient appears to be doing better with regard to his legs bilaterally. Especially the right leg which was much more significant last week. There does not appear to be any signs of active infection which is great news. No fevers, chills, nausea, vomiting, or diarrhea. Objective Constitutional Obese and well-hydrated in no acute distress. Vitals Time Taken: 10:31 AM, Height: 70 in, Weight: 380.2 lbs, BMI: 54.5, Temperature: 97.6 F, Pulse: 65 bpm, Respiratory Rate: 22 breaths/min, Blood Pressure: 147/68 mmHg, Capillary Blood Glucose: 168 mg/dl. Respiratory normal breathing without difficulty. Psychiatric this patient is able to make decisions and demonstrates good insight into disease process. Alert and Oriented x 3. pleasant and cooperative. General Notes: Patient's wound bed actually showed signs of good granulation and epithelization at this point. There does not appear to be any evidence of infection. With that being said I do believe that the patient is having a lot of drainage from the right leg this is something we can need to keep an eye on as well. Integumentary (Hair, Skin) Wound #185 status is Open. Original cause of wound was Blister. The wound is located on the  Right,Circumferential Lower Leg. The wound measures 10cm length x 31cm width x 0.1cm depth; 243.473cm^2 area and 24.347cm^3 volume. There is no tunneling or undermining noted. There is a large amount of purulent drainage noted. The wound margin is distinct with the outline attached to the wound base. There is medium (34-66%) red, pink granulation within the wound bed. There is a medium (34-66%) amount of necrotic tissue within the wound bed including Adherent Slough. Wound #189 status is Healed - Epithelialized. Original cause of wound was Trauma. The wound is located on the Right,Dorsal Upper Arm. The wound measures 0cm length x 0cm width x 0cm depth; 0cm^2 area and 0cm^3 volume. Wound #190 status is Open. Original cause of wound was Gradually Appeared. The wound is located on the Right T Second. The wound measures 2cm length oe x 1cm width x 0.1cm depth; 1.571cm^2 area and 0.157cm^3 volume. There is no tunneling or undermining noted. There is a medium amount of serosanguineous drainage noted. The wound margin is distinct with the outline attached to the wound base. There is large (67-100%) red, pink granulation within the wound  bed. There is a small (1-33%) amount of necrotic tissue within the wound bed including Adherent Slough. 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 Bathing/ Shower/ Hygiene: May shower with protection but do not get wound dressing(s) wet. Edema Control - Lymphedema / SCD / Other: Lymphedema Pumps. Use  Lymphedema pumps on leg(s) 2-3 times a day for 45-60 minutes. If wearing any wraps or hose, do not remove them. Continue exercising as instructed. Elevate legs to the level of the heart or above for 30 minutes daily and/or when sitting, a frequency of: - throughout the day Avoid standing for long periods of time. Exercise regularly Other Edema Control Orders/Instructions: - 4 layer compression wrap to left leg Non Wound Condition: Other Non Wound Condition Orders/Instructions: - lotion to leg, 4 layer compression wrap WOUND #185: - Lower Leg Wound Laterality: Right, Circumferential Peri-Wound Care: Sween Lotion (Moisturizing lotion) 1 x Per Week/ Discharge Instructions: Apply moisturizing lotion as directed Prim Dressing: KerraCel Ag Gelling Fiber Dressing, 4x5 in (silver alginate) 1 x Per Week/ ary Discharge Instructions: Apply silver alginate to wound bed as instructed Secondary Dressing: ABD Pad, 8x10 1 x Per Week/ Discharge Instructions: Apply over primary dressing as directed. Secondary Dressing: CarboFLEX Odor Control Dressing, 4x4 in 1 x Per Week/ Discharge Instructions: Apply over primary dressing as directed. Com pression Wrap: Unnaboot w/Calamine, 4x10 (in/yd) 1 x Per Week/ Discharge Instructions: Apply Unnaboot as directed. WOUND #190: - T Second Wound Laterality: Right oe Prim Dressing: KerraCel Ag Gelling Fiber Dressing, 2x2 in (silver alginate) ary Discharge Instructions: Apply silver alginate to wound bed as instructed Secondary Dressing: Woven Gauze Sponges 2x2 in Discharge Instructions: Apply over primary dressing as directed. Secured With: Insurance underwriter, Sterile 2x75 (in/in) Discharge Instructions: Secure with stretch gauze as directed. 1. Would recommend currently that we go and continue with wound care measures as before the patient is in agreement the plan this includes the use of the silver alginate dressing to the open wound locations. 2. Also  to continue with a 4-layer compression wrap on the left and Unna boot on the right. I do believe this has been beneficial for him but I would suggest that we continue with such. 3. I am also going to suggest patient continue to elevate his legs much as possible, take his fluid pills, and uses lymphedema pumps on a daily basis. We will see patient back for reevaluation in 1 week here in the clinic. If anything worsens or changes patient will contact our office for additional recommendations. Electronic Signature(s) Signed: 11/15/2020 11:04:39 AM By: Lenda Kelp PA-C Entered By: Lenda Kelp on 11/15/2020 11:04:38 -------------------------------------------------------------------------------- SuperBill Details Patient Name: Date of Service: Kevin [name]. 11/15/2020 Medical Record Number: [id number] Patient Account Number: [id number] Date of Birth/Sex: Treating RN: 10/10/1951 (70 y.o. 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) with ulcer and inflammation of bilateral lower extremity I89.0 Lymphedema, not elsewhere classified E11.622 Type 2 diabetes mellitus with other skin ulcer 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, unspecified L03.116 Cellulitis of left lower limb Facility Procedures CPT4 Code: 16109604 Description: (Facility Use Only) [phone] - APPLY UNNA BOOT RT Modifier: Quantity: 1 CPT4 Code: 91478295 Description: (Facility Use Only) [id number] - APPLY MULTLAY COMPRS LWR LT LEG Modifier: 59 Quantity: 1 Physician Procedures : CPT4 Code Description Modifier [id number] -  WC PHYS LEVEL 3 - EST PT ICD-10 Diagnosis Description 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 skin L97.221 Non-pressure chronic ulcer of left calf limited to breakdown of  skin 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 of bilateral lower extremity I89.0 Lymphedema, not elsewhere classified Quantity: 1 Electronic Signature(s) Signed: 11/15/2020 11:05:15 AM By: Lenda Kelp PA-C Entered By: Lenda Kelp on 11/15/2020 11:05:11

## 2020-11-16 ENCOUNTER — Other Ambulatory Visit: Payer: Self-pay | Admitting: Family Medicine

## 2020-11-16 MED ORDER — HYDROCODONE-ACETAMINOPHEN 5-325 MG PO TABS: ORAL_TABLET | ORAL | 0 refills | Status: DC

## 2020-11-16 NOTE — Telephone Encounter (Signed)
Requesting: hydrocodone Contract: 04/12/20 UDS: n/a Last Visit:10/11/20 Next Visit:01/10/21 Last Refill:10/02/20(60,0)  Please Advise. Medication pending

## 2020-11-16 NOTE — Telephone Encounter (Signed)
Patient requesting refill of hydrocodone. Please send to same [facility] in [place].

## 2020-11-16 NOTE — Telephone Encounter (Signed)
Patient advised refill sent. 

## 2020-11-22 ENCOUNTER — Other Ambulatory Visit: Payer: Self-pay

## 2020-11-22 ENCOUNTER — Encounter (HOSPITAL_BASED_OUTPATIENT_CLINIC_OR_DEPARTMENT_OTHER): Payer: Medicare Other | Admitting: Physician Assistant

## 2020-11-22 DIAGNOSIS — E11621 Type 2 diabetes mellitus with foot ulcer: Secondary | ICD-10-CM | POA: Diagnosis not present

## 2020-11-22 DIAGNOSIS — L98492 Non-pressure chronic ulcer of skin of other sites with fat layer exposed: Secondary | ICD-10-CM | POA: Diagnosis not present

## 2020-11-22 DIAGNOSIS — E1140 Type 2 diabetes mellitus with diabetic neuropathy, unspecified: Secondary | ICD-10-CM | POA: Diagnosis not present

## 2020-11-22 DIAGNOSIS — I87333 Chronic venous hypertension (idiopathic) with ulcer and inflammation of bilateral lower extremity: Secondary | ICD-10-CM | POA: Diagnosis not present

## 2020-11-22 DIAGNOSIS — I890 Lymphedema, not elsewhere classified: Secondary | ICD-10-CM | POA: Diagnosis not present

## 2020-11-22 DIAGNOSIS — E11622 Type 2 diabetes mellitus with other skin ulcer: Secondary | ICD-10-CM | POA: Diagnosis not present

## 2020-11-22 DIAGNOSIS — L97519 Non-pressure chronic ulcer of other part of right foot with unspecified severity: Secondary | ICD-10-CM | POA: Diagnosis not present

## 2020-11-22 DIAGNOSIS — E1129 Type 2 diabetes mellitus with other diabetic kidney complication: Secondary | ICD-10-CM | POA: Diagnosis not present

## 2020-11-22 DIAGNOSIS — N289 Disorder of kidney and ureter, unspecified: Secondary | ICD-10-CM | POA: Diagnosis not present

## 2020-11-22 DIAGNOSIS — L97812 Non-pressure chronic ulcer of other part of right lower leg with fat layer exposed: Secondary | ICD-10-CM | POA: Diagnosis not present
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
## 2020-11-23 ENCOUNTER — Other Ambulatory Visit: Payer: Self-pay | Admitting: Family Medicine

## 2020-11-23 NOTE — Progress Notes (Addendum)
TEJA, JUDICE ([id number]) Visit Report for 11/22/2020 Chief Complaint Document Details Patient Name: Date of Service: CO KISHON, GARRIGA 11/22/2020 10:30 A M Medical Record Number: [id number] Patient Account Number: [id number] Date of Birth/Sex: Treating RN: 1951/07/24 (70 y.o. [name] Primary Care Provider: Nicoletta Ba Other Clinician: Referring Provider: Treating Provider/Extender: Adele Dan in Treatment: [phone] Information Obtained from: Patient Chief Complaint patient is here for evaluation venous/lymphedema weeping Electronic Signature(s) Signed: 11/22/2020 11:10:32 AM By: Lenda Kelp PA-C Entered By: Lenda Kelp on 11/22/2020 11:10:32 -------------------------------------------------------------------------------- HPI Details Patient Name: Date of Service: [name], Normal J. 11/22/2020 10:30 A M Medical Record Number: [id number] Patient Account Number: [id number] Date of Birth/Sex: Treating RN: May 23, 1951 (70 y.o. [name] Primary Care Provider: Nicoletta Ba Other Clinician: Referring Provider: Treating Provider/Extender: Adele Dan in Treatment: 252 History of Present Illness HPI Description: Referred by PCP for consultation. Patient has long standing history of BLE venous stasis, no prior ulcerations. At beginning of month, developed cellulitis and weeping. Received IM Rocephin followed by Keflex and resolved. Wears compression stocking, appr 6 months old. Not sure strength. No present drainage. 01/22/16 this is a patient who is a type II diabetic on insulin. 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 He tells me he religiously wears pressure stockings of uncertain strength. He was here with weeping edema about 8 months ago but did not have an open wound. Roughly a month ago he had a reopening on his bilateral legs. He is been using bandages and Neosporin. He  does not complain of pain. 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 Last BUN/creatinine I have is from 11/20/15 at 13 and 1.0 respectively 01/29/16; patient arrives today having tolerated the Profore wrap. He brought in his stockings and these are 18 mmHg stockings he bought from [place]. The compression here is likely inadequate. He does not complain of pain or excessive drainage she has no systemic symptoms. The wound on the right looks improved as does the one on the left although one on the left is more substantial with still tissue at risk below the actual wound area on the bilateral posterior calf 02/05/16; patient arrives with poor edema control. He states that we did put a 4 layer compression on it last week. No weight appear 5 this. 02/12/16; the area on the posterior right Has healed. The left Has a substantial wound that has necrotic surface eschar that requires a debridement with a curette. 02/16/16;the patient called or a Nurse visit secondary to increased swelling. He had been in earlier in the week with his right leg healed. He was transitioned to is on pressure stocking on the right leg with the only open wound on the left, a substantial area on the left posterior calf. Note he has a history of severe lower extremity edema, he has a history of chronic atrial fibrillation but not heart failure per my notes but I'll need to research this. He is not complaining of chest pain shortness of breath or orthopnea. The intake nurse noted blisters on the previously closed right leg 02/19/16; this is the patient's regular visit day. I see him on Friday with escalating edema new wounds on the right leg and clear signs of at least right ventricular heart failure. I increased his Lasix to 40 twice a day.  He is returning currently in follow-up. States he is noticed a decrease in that the  edema 02/26/16 patient's legs have much less edema. There is nothing really open on the right leg. The left leg has improved condition of the large superficial wound on the posterior left leg 03/04/16; edema control is very much better. The patient's right leg wounds have healed. On the left leg he continues to have severe venous inflammation on the posterior aspect of the left leg. There is no tenderness and I don't think any of this is cellulitis. 03/11/16; patient's right leg is married healed and he is in his own stocking. The patient's left leg has deteriorated somewhat. There is a lot of erythema around the wound on the posterior left leg. There is also a significant rim of erythema posteriorly just above where the wrap would've ended there is a new wound in this location and a lot of tenderness. Can't rule out cellulitis in this area. 03/15/16; patient's right leg remains healed and he is in his own stocking. The patient's left leg is much better than last review. His major wound on the posterior aspect of his left Is almost fully epithelialized. He has 3 small injuries from the wraps. Really. Erythema seems a lot better on antibiotics 03/18/16; right leg remains healed and he is in his own stocking. The patient's left leg is much better. The area on the posterior aspect of the left calf is fully epithelialized. His 3 small injuries which were wrap injuries on the left are improved only one seems still open his erythema has resolved 03/25/16; patient's right leg remains healed and he is in his own stocking. There is no open area today on the left leg posterior leg is completely closed up. His wrap injuries at the superior aspect of his leg are also resolved. He looks as though he has some irritation on the dorsal ankle but this is fully epithelialized without evidence of infection. 03/28/16; we discharged this patient on Monday. Transitioned him into his own stocking. There were problems almost  immediately with uncontrolled swelling weeping edema multiple some of which have opened. He does not feel systemically unwell in particular no chest pain no shortness of breath and he does not feel 04/08/16; the edema is under better control with the Profore light wrap but he still has pitting edema. There is one large wound anteriorly 2 on the medial aspect of his left leg and 3 small areas on the superior posterior calf. Drainage is not excessive he is tolerating a Profore light well 04/15/16; put a Profore wrap on him last week. This is controlled is edema however he had a lot of pain on his left anterior foot most of his wounds are healed 04/22/16 once again the patient has denuded areas on the left anterior foot which he states are because his wrap slips up word. He saw his primary physician today is on Lasix 40 twice a day and states that he his weight is down 20 pounds over the last 3 months. 04/29/16: Much improved. left anterior foot much improved. He is now on Lasix 80 mg per day. Much improved edema control 05/06/16; I was hoping to be able to discharge him today however once again he has blisters at a low level of where the compression was placed last week mostly on his left lateral but also his left medial leg and a small area on the anterior part of the left foot. 05/09/16; apparently the patient went  home after his appointment on 7/4 later in the evening developing pain in his upper medial thigh together with subjective fever and chills although his temperature was not taken. The pain was so intense he felt he would probably have to call 911. However he then remembered that he had leftover doxycycline from a previous round of antibiotics and took these. By the next morning he felt a lot better. He called and spoke to one of our nurses and I approved doxycycline over the phone thinking that this was in relation to the wounds we had previously seen although they were definitely were not. The  patient feels a lot better old fever no chills he is still working. Blood sugars are reasonably controlled 05/13/16; patient is back in for review of his cellulitis on his anterior medial upper thigh. He is taking doxycycline this is a lot better. Culture I did of the nodular area on the dorsal aspect of his foot grew MRSA this also looks a lot better. 05/20/16; the patient is cellulitis on the medial upper thigh has resolved. All of his wound areas including the left anterior foot, areas on the medial aspect of the left calf and the lateral aspect of the calf at all resolved. He has a new blister on the left dorsal foot at the level of the fourth toe this was excised. No evidence of infection 05/27/16; patient continues to complain weeping edema. He has new blisterlike wounds on the left anterior lateral and posterior lateral calf at the top of his wrap levels. The area on his left anterior foot appears better. He is not complaining of fever, pain or pruritus in his feet. 05/30/16; the patient's blisters on his left anterior leg posterior calf all look improved. He did not increase the Lasix 100 mg as I suggested because he was going to run out of his 40 mg tablets. He is still having weeping edema of his toes 06/03/16; I renewed his Lasix at 80 mg once a day as he was about to run out when I last saw him. He is on 80 mg of Lasix now. I have asked him to cut down on the excessive amount of water he was drinking and asked him to drink according to his thirst mechanisms 06/12/2016 -- was seen 2 days ago and was supposed to wear his compression stockings at home but he is developed lymphedema and superficial blisters on the left lower extremity and hence came in for a review 06/24/16; the remaining wound is on his left anterior leg. He still has edema coming from between his toes. There is lymphedema here however his edema is generally better than when I last saw this. He has a history of atrial fibrillation  but does not have a known history of congestive heart failure nevertheless I think he probably has this at least on a diastolic basis. 07/01/16 I reviewed his echocardiogram from January 2017. This was essentially normal. He did not have LVH, EF of 55-60%. His right ventricular function was normal although he did have trivial tricuspid and pulmonic regurgitation. This is not audible on exam however. I increased his Lasix to do massive edema in his legs well above his knees I think in early July. He was also drinking an excessive amount of water at the time. 07/15/16; missed his appointment last week because of the Labor Day holiday on Monday. He could not get another appointment later in the week. Started to feel the wrap digging in superiorly so  we remove the top half and the bottom half of his wrap. He has extensive erythema and blistering superiorly in the left leg. Very tender. Very swollen. Edema in his foot with leaking edema fluid. He has not been systemically unwell 07/22/16; the area on the left leg laterally required some debridement. The medial wounds look more stable. His wrap injury wounds appear to have healed. Edema and his foot is better, weeping edema is also better. He tells me he is meeting with the supplier of the external compression pumps at work 08/05/16; the patient was on vacation last week in [facility]. His wrap is been on for an extended period of time. Also over the weekend he developed an extensive area of tender erythema across his anterior medial thigh. He took to doxycycline yesterday that he had leftover from a previous prescription. The patient complains of weeping edema coming out of his toes 08/08/16; I saw this patient on 10/2. He was tender across his anterior thigh. I put him on doxycycline. He returns today in follow-up. He does not have any open wounds on his lower leg, he still has edema weeping into his toes. 08/12/16; patient was seen back urgently today to  follow-up for his extensive left thigh cellulitis/erysipelas. He comes back with a lot less swelling and erythema pain is much better. I believe I gave him Augmentin and Cipro. His wrap was cut down as he stated a roll down his legs. He developed blistering above the level of the wrap that remained. He has 2 open blisters and 1 intact. 08/19/16; patient is been doing his primary doctor who is increased his Lasix from 40-80 once a day or 80 already has less edema. Cellulitis has remained improved in the left thigh. 2 open areas on the posterior left calf 08/26/16; he returns today having new open blisters on the anterior part of his left leg. He has his compression pumps but is not yet been shown how to use some vital representative from the supplier. 09/02/16 patient returns today with no open wounds on the left leg. Some maceration in his plantar toes 09/10/2016 -- Dr. Leanord Hawking had recently discharged him on 09/02/2016 and he has come right back with redness swelling and some open ulcers on his left lower extremity. He says this was caused by trying to apply his compression stockings and he's been unable to use this and has not been able to use his lymphedema pumps. He had some doxycycline leftover and he has started on this a few days ago. 09/16/16; there are no open wounds on his leg on the left and no evidence of cellulitis. 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 He does not complain of symptoms here. I am not clear use using his external compression pumps. 09/23/16; I have not seen this patient in 2 weeks. He canceled his appointment 10 days ago as he was going on vacation. He tells me that on Monday he noticed a large area on his posterior left leg which is been draining copiously and is reopened into a large wound. He is been using ABDs and the external part of his juxtalite, according to our nurse this was not on properly. 10/07/16;  Still a substantial area on the posterior left leg. Using silver alginate 10/14/16; in general better although there is still open area which looks healthy. Still using silver alginate. He reminds me that this happen before he left for [facility].  T oday while he was showering in the morning. He had been using his juxtalite's 10/21/16; the area on his posterior left leg is fully epithelialized. However he arrives today with a large area of tender erythema in his medial and posterior left thigh just above the knee. I have marked the area. Once again he is reluctant to consider hospitalization. I treated him with oral antibiotics in the past for a similar situation with resolution I think with doxycycline however this area it seems more extensive to me. He is not complaining of fever but does have chills and says states he is thirsty. His blood sugar today was in the 140s at home 10/25/16 the area on his posterior left leg is fully epithelialized although there is still some weeping edema. The large area of tenderness and erythema in his medial and posterior left thigh is a lot less tender although there is still a lot of swelling in this thigh. He states he feels a lot better. He is on doxycycline and Augmentin that I started last week. This will continued until Tuesday, December 26. I have ordered a duplex ultrasound of the left thigh rule out DVT whether there is an abscess something that would need to be drained I would also like to know. 11/01/16; he still has weeping edema from a not fully epithelialized area on his left posterior calf. Most of the rest of this looks a lot better. He has completed his antibiotics. His thigh is a lot better. Duplex ultrasound did not show a DVT in the thigh 11/08/16; he comes in today with more Denuded surface epithelium from the posterior aspect of his calf. There is no real evidence of cellulitis. The superior aspect of his wrap appears to have put quite an  indentation in his leg just below the knee and this may have contributed. He does not complain of pain or fever. We have been using silver alginate as the primary dressing. The area of cellulitis in the right thigh has totally resolved. He has been using his compression stockings once a week 11/15/16; the patient arrives today with more loss of epithelium from the posterior aspect of his left calf. He now has a fairly substantial wound in this area. The reason behind this deterioration isn't exactly clear although his edema is not well controlled. He states he feels he is generally more swollen systemically. He is not complaining of chest pain shortness of breath fever. T me he has an appointment with his primary physician in early February. He is on 80 mg of oral ells Lasix a day. He claims compliance with the external compression pumps. He is not having any pain in his legs similar to what he has with his recurrent cellulitis 11/22/16; the patient arrives a follow-up of his large area on his left lateral calf. This looks somewhat better today. He came in earlier in the week for a dressing change since I saw him a week ago. He is not complaining of any pain no shortness of breath no chest pain 11/28/16; the patient arrives for follow-up of his large area on the left lateral calf this does not look better. In fact it is larger weeping edema. The surface of the wound does not look too bad. We have been using silver alginate although I'm not certain that this is a dressing issue. 12/05/16; again the patient follows up for a large wound on the left lateral and left posterior calf this does not look better. There continues  to be weeping edema necrotic surface tissue. More worrisome than this once again there is erythema below the wound involving the distal Achilles and heel suggestive of cellulitis. He is on his feet working most of the day of this is not going well. We are changing his dressing twice a week to  facilitate the drainage. 12/12/16; not much change in the overall dimensions of the large area on the left posterior calf. This is very inflamed looking. I gave him an. Doxycycline last week does not really seem to have helped. He found the wrap very painful indeed it seems to of dog into his legs superiorly and perhaps around the heel. He came in early today because the drainage had soaked through his dressings. 12/19/16- patient arrives for follow-up evaluation of his left lower extremity ulcers. He states that he is using his lymphedema pumps once daily when there is "no drainage". He admits to not using his lipedema pumps while under current treatment. His blood sugars have been consistently between 150-200. 12/26/16; the patient is not using his compression pumps at home because of the wetness on his feet. I've advised him that I think it's important for him to use this daily. He finds his feet too wet, he can put a plastic bag over his legs while he is in the pumps. Otherwise I think will be in a vicious circle. We are using silver alginate to the major area on his left posterior calf 01/02/17; the patient's posterior left leg has further of all into 3 open wounds. All of them covered with a necrotic surface. He claims to be using his compression pumps once a day. His edema control is marginal. Continue with silver alginate 01/10/17; the patient's left posterior leg actually looks somewhat better. There is less edema, less erythema. Still has 3 open areas covered with a necrotic surface requiring debridement. He claims to be using his compression pumps once a day his edema control is better 01/17/17; the patient's left posterior calf look better last week when I saw him and his wrap was changed 2 days ago. He has noted increasing pain in the left heel and arrives today with much larger wounds extensive erythema extending down into the entire heel area especially tender medially. He is not  systemically unwell CBGs have been controlled no fever. Our intake nurse showed me limegreen drainage on his AVD pads. 01/24/17; his usual this patient responds nicely to antibiotics last week giving him Levaquin for presumed Pseudomonas. The whole entire posterior part of his leg is much better much less inflamed and in the case of his Achilles heel area much less tender. He has also had some epithelialization posteriorly there are still open areas here and still draining but overall considerably better 01/31/17- He has continue to tolerate the compression wraps. he states that he continues to use the lymphedema pumps daily, and can increase to twice daily on the weekends. He is voicing no complaints or concerns regarding his LLE ulcers 02/07/17-he is here for follow-up evaluation. He states that he noted some erythema to the left medial and anterior thigh, which he states is new as of yesterday. He is concerned about recurrent cellulitis. He states his blood sugars have been slightly elevated, this morning in the 180s 02/14/17; he is here for follow-up evaluation. When he was last here there was erythema superiorly from his posterior wound in his anterior thigh. He was prescribed Levaquin however a culture of the wound surface grew MRSA over the  phone I changed him to doxycycline on Monday and things seem to be a lot better. 02/24/17; patient missed his appointment on Friday therefore we changed his nurse visit into a physician visit today. Still using silver alginate on the large area of the posterior left thigh. He isn't new area on the dorsal left second toe 03/03/17; actually better today although he admits he has not used his external compression pumps in the last 2 days or so because of work responsibilities over the weekend. 03/10/17; continued improvement. External compression pumps once a day almost all of his wounds have closed on the posterior left calf. Better edema control 03/17/17; in general  improved. He still has 3 small open areas on the lateral aspect of his left leg however most of the area on the posterior part of his leg is epithelialized. He has better edema control. He has an ABD pad under his stocking on the right anterior lower leg although he did not let us look at that today. 03/24/17; patient arrives back in clinic today with no open areas however there are areas on the posterior left calf and anterior left calf that are less than 100% epithelialized. His edema is well controlled in the left lower leg. There is some pitting edema probably lymphedema in the left upper thigh. He uses compression pumps at home once per day. I tried to get him to do this twice a day although he is very reticent. 04/01/2017 -- for the last 2 days he's had significant redness, tenderness and weeping and came in for an urgent visit today. 04/07/17; patient still has 6 more days of doxycycline. He was seen by Dr. Meyer Russel last Wednesday for cellulitis involving the posterior aspect, lateral aspect of his Involving his heel. For the most part he is better there is less erythema and less weeping. He has been on his feet for 12 hours 2 over the weekend. Using his compression pumps once a day 04/14/17 arrives today with continued improvement. Only one area on the posterior left calf that is not fully epithelialized. He has intense bilateral venous inflammation associated with his chronic venous insufficiency disease and secondary lymphedema. We have been using silver alginate to the left posterior calf wound In passing he tells [place] today that the right leg but we have not seen in quite some time has an open area on it but he doesn't want [place] to look at this today states he will show this to [place] next week. 04/21/17; there is no open area on his left leg although he still reports some weeping edema. He showed [place] his right leg today which is the first time we've seen this leg in a long time. He has a large area of  open wound on the right leg anteriorly healthy granulation. Quite a bit of swelling in the right leg and some degree of venous inflammation. He told us about the right leg in passing last week but states that deterioration in the right leg really only happened over the weekend 04/28/17; there is no open area on the left leg although there is an irritated part on the posterior which is like a wrap injury. The wound on the right leg which was new from last week at least to [place] is a lot better. 05/05/17; still no open area on the left leg. Patient is using his new compression stocking which seems to be doing a good job of controlling the edema. He states he is using his compression  pumps once per day. The right leg still has an open wound although it is better in terms of surface area. Required debridement. A lot of pain in the posterior right Achilles marked tenderness. Usually this type of presentation this patient gives concern for an active cellulitis 05/12/17; patient arrives today with his major wound from last week on the right lateral leg somewhat better. Still requiring debridement. He was using his compression stocking on the left leg however that is reopened with superficial wounds anteriorly he did not have an open wound on this leg previously. He is still using his juxta light's once daily at night. He cannot find the time to do this in the morning as he has to be at work by 7 AM 05/19/17; right lateral leg wound looks improved. No debridement required. The concerning area is on the left posterior leg which appears to almost have a subcutaneous hemorrhagic component to it. We've been using silver alginate to all the wounds 05/26/17; the right lateral leg wound continues to look improved. However the area on the left posterior calf is a tightly adherent surface. [place] using silver alginate. Because of the weeping edema in his legs there is very little good alternatives. 06/02/17; the patient left  here last week looking quite good. Major wound on the left posterior calf and a small one on the right lateral calf. Both of these look satisfactory. He tells me that by Wednesday he had noted increased pain in the left leg and drainage. He called on Thursday and Friday to get an appointment here but we were blocked. He did not go to urgent care or his primary physician. He thinks he had a fever on Thursday but did not actually take his temperature. He has not been using his compression pumps on the left leg because of pain. I advised him to go to the emergency room today for IV antibiotics for stents of left leg cellulitis but he has refused I have asked him to take 2 days off work to keep his leg elevated and he has refused this as well. In view of this I'm going to call him and Augmentin and doxycycline. 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 However after a detailed discussion regarding the risk benefits and alternatives he refuses to get admitted to the hospital. With no other recourse I will continue him on oral antibiotics as before and hopefully he'll have his infectious disease consultation this week. 06/16/2017 -- the patient was seen today by the nurse practitioner at infectious disease Ms. Dixon. 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 He has also been advise Lotrimin cream locally. she also had wise IV antibiotics for his condition if it flares up 06/23/17; patient arrives today with drainage bilaterally although the remaining wound on the left posterior calf after  cleaning up today "highlighter yellow drainage" did not look too bad. Unfortunately he has had breakdown on the right anterior leg [previously this leg had not been open and he is using a black stocking] he went to see infectious disease  and is been put on clindamycin 150 mg daily, I did not verify the dose although I'm not familiar with using clindamycin in this dosing range, perhaps for prophylaxisoo 06/27/17; I brought this patient back today to follow-up on the wound deterioration on the right lower leg together with surrounding cellulitis. I started him on doxycycline 4 days ago. This area looks better however he comes in today with intense cellulitis on the medial part of his left thigh. This is not have a wound in this area. Extremely tender. We've been using silver alginate to the wounds on the right lower leg left lower leg with bilateral 4 layer compression he is using his external compression pumps once a day 07/04/17; patient's left medial thigh cellulitis looks better. He has not been using his compression pumps as his insert said it was contraindicated with cellulitis. His right leg continues to make improvements all the wounds are still open. We only have one remaining wound on the left posterior calf. Using silver alginate to all open areas. He is on doxycycline which I started a week ago and should be finishing I gave him Augmentin after Thursday's visit for the severe cellulitis on the left medial thigh which fortunately looks better 07/14/17; the patient's left medial thigh cellulitis has resolved. The cellulitis in his right lower calf on the right also looks better. All of his wounds are stable to improved we've been using silver alginate he has completed the antibiotics I have given him. He has clindamycin 150 mg once a day prescribed by infectious disease for prophylaxis, I've advised him to start this now. We have been using bilateral Unna boots over silver alginate to the wound  areas 07/21/17; the patient is been to see infectious disease who noted his recurrent problems with cellulitis. He was not able to tolerate prophylactic clindamycin therefore he is on amoxicillin 500 twice a day. He also had a second daily dose of Lasix added By Dr. Oneta Rack but he is not taking this. Nor is he being completely compliant with his compression pumps a especially not this week. He has 2 remaining wounds one on the right posterior lateral lower leg and one on the left posterior medial lower leg. 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 The patient has Unna boots bilaterally. 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 He has bilateral Unna boots for compression and silver alginate to his wounds. Arrives today with his legs looking as good as I have seen him in quite some time. Not surprisingly his wounds look better as well with improvement on the right lateral leg venous insufficiency wound and also the left medial leg. He is still using the compression pumps once a day 08/11/17; both legs appear to be doing better wounds on the right lateral and left medial legs look better. Skin on the right leg quite good. He is been using silver alginate as the primary dressing. I'm going to use Anasept gel calcium alginate and maintain all the secondary dressings 08/18/17; the patient continues to actually do quite well. The area on his right lateral leg is just about closed the left medial also looks better although it is still moist in this area. His edema is well controlled we have been using Anasept gel with calcium alginate and  the usual secondary dressings, 4 layer compression and once daily use of his compression pumps "always been able to manage 09/01/17; the patient  continues to do reasonably well in spite of his trip to [place]. The area on the right lateral leg is epithelialized. Left is much better but still open. He has more edema and more chronic erythema on the left leg [venous inflammation] 09/08/17; he arrives today with no open wound on the right lateral leg and decently controlled edema. Unfortunately his left leg is not nearly as in his good situation as last week.he apparently had increasing edema starting on Saturday. He edema soaked through into his foot so used a plastic bag to walk around his home. The area on the medial right leg which was his open area is about the same however he has lost surface epithelium on the left lateral which is new and he has significant pain in the Achilles area of the left foot. He is already on amoxicillin chronically for prophylaxis of cellulitis in the left leg 09/15/17; he is completed a week of doxycycline and the cellulitis in the left posterior leg and Achilles area is as usual improved. He still has a lot of edema and fluid soaking through his dressings. There is no open wound on the right leg. He saw infectious disease NP today 09/22/17;As usual 1 we transition him from our compression wraps to his stockings things did not go well. He has several small open areas on the right leg. He states this was caused by the compression wrap on his skin although he did not wear this with the stockings over them. He has several superficial areas on the left leg medially laterally posteriorly. He does not have any evidence of active cellulitis especially involving the left Achilles The patient is traveling from [facility] Saturday going to [facility]. He states he isn't attempting to get an appointment with a heel objects wound center there to change his dressings. I am not completely certain whether this will work 10/06/17; the patient came in on Friday for a nurse visit and the nurse reported that his legs actually look  quite good. He arrives in clinic today for his regular follow-up visit. He has a new wound on his left third toe over the PIP probably caused by friction with his footwear. He has small areas on the left leg and a very superficial but epithelialized area on the right anterior lateral lower leg. Other than that his legs look as good as I've seen him in quite some time. We have been using silver alginate Review of systems; no chest pain no shortness of breath other than this a 10 point review of systems negative 10/20/17; seen by Dr. Meyer Russel last week. He had taken some antibiotics [doxycycline] that he had left over. Dr. Meyer Russel thought he had candida infection and declined to give him further antibiotics. He has a small wound remaining on the right lateral leg several areas on the left leg including a larger area on the left posterior several left medial and anterior and a small wound on the left lateral. The area on the left dorsal third toe looks a lot better. ROS; Gen.; no fever, respiratory no cough no sputum Cardiac no chest pain other than this 10 point review of system is negative 10/30/17; patient arrives today having fallen in the bathtub 3 days ago. It took him a while to get up. He has pain and maceration in the wounds on his left  leg which have deteriorated. He has not been using his pumps he also has some maceration on the right lateral leg. 11/03/17; patient continues to have weeping edema especially in the left leg. This saturates his dressings which were just put on on 12/27. As usual the doxycycline seems to take care of the cellulitis on his lower leg. He is not complaining of fever, chills, or other systemic symptoms. He states his leg feels a lot better on the doxycycline I gave him empirically. He also apparently gets injections at his primary doctor's officeo Rocephin for cellulitis prophylaxis. I didn't ask him about his compression pump compliance today I think that's probably  marginal. Arrives in the clinic with all of his dressings primary and secondary macerated full of fluid and he has bilateral edema 11/10/17; the patient's right leg looks some better although there is still a cluster of wounds on the right lateral. The left leg is inflamed with almost circumferential skin loss medially to laterally although we are still maintaining anteriorly. He does not have overt cellulitis there is a lot of drainage. He is not using compression pumps. We have been using silver alginate to the wound areas, there are not a lot of options here 11/17/17; the patient's right leg continues to be stable although there is still open wounds, better than last week. The inflammation in the left leg is better. Still loss of surface layer epithelium especially posteriorly. There is no overt cellulitis in the amount of edema and his left leg is really quite good, tells me he is using his compression pumps once a day. 11/24/17; patient's right leg has a small superficial wound laterally this continues to improve. The inflammation in the left leg is still improving however we have continuous surface layer epithelial loss posteriorly. There is no overt cellulitis in the amount of edema in both legs is really quite good. He states he is using his compression pumps on the left leg once a day for 5 out of 7 days 12/01/17; very small superficial areas on the right lateral leg continue to improve. Edema control in both legs is better today. He has continued loss of surface epithelialization and left posterior calf although I think this is better. We have been using silver alginate with large number of absorptive secondary dressings 4 layer on the left Unna boot on the right at his request. He tells me he is using his compression pumps once a day 12/08/17; he has no open area on the right leg is edema control is good here. On the left leg however he has marked erythema and tenderness breakdown of skin. He has  what appears to be a wrap injury just distal to the popliteal fossa. 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 There was swelling erythema and breakdown in the right leg. He has much worse skin breakdown in the left leg as well multiple open areas medially and posteriorly as well as laterally. He tells me he has been using his compression pumps but tells me he feels that the drainage out of his leg is worse when he uses a compression pumps. T be fair to him he is been saying this o for a while however I don't  know that I have really been listening to this. I wonder if the compression pumps are working properly 12/22/17;. Once again he arrives with severe erythema, weeping edema from the left greater than right leg. Noncompliance with compression pumps. New this visit he is complaining of pain on the lateral aspect of the right leg and the medial aspect of his right thigh. He apparently saw his cardiologist Dr. [name] who was ordered an echocardiogram area and I think this is a step in the right direction 12/25/17; started his doxycycline Monday night. There is still intense erythema of the right leg especially in the anterior thigh although there is less tenderness. The erythema around the wound on the right lateral calf also is less tender. He still complaining of pain in the left heel. His wounds are about the same right lateral left medial left lateral. Superficial but certainly not close to closure. He denies being systemically unwell no fever chills no abdominal pain no diarrhea 12/29/17; back in follow-up of his extensive right calf and right thigh cellulitis. I added amoxicillin to cover possible doxycycline resistant  strep. This seems to of done the trick he is in much less pain there is much less erythema and swelling. He has his echocardiogram at 11:00 this morning. X-ray of the left heel was also negative. 01/05/18; the patient arrived with his edema under much better control. Now that he is retired he is able to use his compression pumps daily and sometimes twice a day per the patient. He has a wound on the right leg the lateral wound looks better. Area on the left leg also looks a lot better. He has no evidence of cellulitis in his bilateral thighs I had a quick peak at his echocardiogram. He is in normal ejection fraction and normal left ventricular function. He has moderate pulmonary hypertension moderately reduced right ventricular function. One would have to wonder about chronic sleep apnea although he says he doesn't snore. He'll review the echocardiogram with his cardiologist. 01/12/18; the patient arrives with the edema in both legs under exemplary control. He is using his compression pumps daily and sometimes twice daily. His wound on the right lateral leg is just about closed. He still has some weeping areas on the posterior left calf and lateral left calf although everything is just about closed here as well. I have spoken with Aldean Baker who is the patient's nurse practitioner and infectious disease. She was concerned that the patient had not understood that the parenteral penicillin injections he was receiving for cellulitis prophylaxis was actually benefiting him. I don't think the patient actually saw that I would tend to agree we were certainly dealing with less infections although he had a serious one last month. 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 He is healed. He'll be placed in juxtalite compression wraps and increase his lymphedema pumps to twice daily. We will follow up again next week to ensure there are no issues with the new  regiment. 01/20/18-he is here for evaluation of bilateral lower extremity weeping edema. Yesterday he was placed in compression wrap to the right lower extremity and compression stocking to left lower shrubbery. He states he uses lymphedema pumps last night and again this morning and noted a blister to the left lower extremity. On exam he was noted to have drainage to the right lower extremity. He will be placed in Unna boots bilaterally and follow-up next week 01/26/18; patient was actually discharged a week  ago to his own juxta light stockings only to return the next day with bilateral lower extremity weeping edema.he was placed in bilateral Unna boots. He arrives today with pain in the back of his left leg. There is no open area on the right leg however there is a linear/wrap injury on the left leg and weeping edema on the left leg posteriorly. I spoke with infectious disease about 10 days ago. They were disappointed that the patient elected to discontinue prophylactic intramuscular penicillin shots as they felt it was particularly beneficial in reducing the frequency of his cellulitis. I discussed this with the patient today. He does not share this view. He'll definitely need antibiotics today. Finally [place] and returning a week later and he does not travel with his pumps. He is going by car 01/30/18; patient was seen 4 days ago and brought back in today for review of cellulitis in the left leg posteriorly. I put him on amoxicillin this really hasn't helped as much as I might like. He is also worried because [place]. Finally we will be rewrapping him. There is no open area on the right leg over his left leg has multiple weeping areas as usual 02/09/18; The same wrap on for 10 days. He did not pick up the last doxycycline I prescribed for him. He apparently took 4 days worth he already had. There is nothing open on  his right leg and the edema control is really quite good. He's had damage in the left leg medially and laterally especially probably related to the prolonged use of Unna boots 02/12/18; the patient arrived in clinic today for a nurse visit/wrap change. He complained of a lot of pain in the left posterior calf. He is taking doxycycline that I previously prescribed for him. Unfortunately even though he used his stockings and apparently used to compression pumps twice a day he has weeping edema coming out of the lateral part of his right leg. This is coming from the lower anterior lateral skin area. 02/16/18; the patient has finished his doxycycline and will finish the amoxicillin 2 days. The area of cellulitis in the left calf posteriorly has resolved. He is no longer having any pain. He tells me he is using his compression pumps at least once a day sometimes twice. 02/23/18; the patient finished his doxycycline and Amoxil last week. On Friday he noticed a small erythematous circle about the size of a quarter on the left lower leg just above his ankle. This rapidly expanded and he now has erythema on the lateral and posterior part of the thigh. This is bright red. Also has an area on the dorsal foot just above his toes and a tender area just below the left popliteal fossa. He came off his prophylactic penicillin injections at his own insistence one or 2 months ago. This is obviously deteriorated since then 03/02/18; patient is on doxycycline and Amoxil. Culture I did last week of the weeping area on the back of his left calf grew group B strep. I have therefore renewed the amoxicillin 500 3 times a day for a further week. He has not been systemically unwell. Still complaining of an area of discomfort right under his left popliteal fossa. There is no open wound on the right leg. He tells me that he is using his pumps twice a day on most days 03/09/18; patient arrives in clinic today completing his  amoxicillin  today. The cellulitis on his left leg is better. Furthermore he tells me that he had intramuscular penicillin shots that his primary care office today. However he also states that the wrap on his right leg fell down shortly after leaving clinic last week. He developed a large blister that was present when he came in for a nurse visit later in the week and then he developed intense discomfort around this area.He tells me he is using his compression pumps 03/16/18; the patient has completed his doxycycline. The infectious part of this/cellulitis in the left heel area left popliteal area is a lot better. He has 2 open areas on the right calf. Still areas on the left calf but this is a lot better as well. 03/24/18; the patient arrives complaining of pain in the left popliteal area again. He thinks some of this is wrap injury. He has no open area on the right leg and really no open area on the left calf either except for the popliteal area. He claims to be compliant with the compression pumps 03/31/18; I gave him doxycycline last week because of cellulitis in the left popliteal area. This is a lot better although the surface epithelium is denuded off and response to this. He arrives today with uncontrolled edema in the right calf area as well as a fingernail injury in the right lateral calf. There is only a few open areas on the left 04/06/18; I gave him amoxicillin doxycycline over the last 2 weeks that the amoxicillin should be completing currently. He is not complaining of any pain or systemic symptoms. The only open areas see has is on the right lateral lower leg paradoxically I cannot see anything on the left lower leg. He tells me he is using his compression pumps twice a day on most days. Silver alginate to the wounds that are open under 4 layer compression 04/13/18; he completed antibiotics and has no new complaints. Using his compression pumps. Silver alginate that anything that's opened 04/20/18; he is  using his compression pumps religiously. Silver alginate 4 layer compression anything that's opened. He comes in today with no open wounds on the left leg but 3 on the right including a new one posteriorly. He has 2 on the right lateral and one on the right posterior. He likes Unna boots on the right leg for reasons that aren't really clear we had the usual 4 layer compression on the left. It may be necessary to move to the 4 layer compression on the right however for now I left them in the Unna boots 04/27/18; he is using his compression pumps at least once a day. He has still the wounds on the right lateral calf. The area right posteriorly has closed. He does not have an open wound on the left under 4 layer compression however on the dorsal left foot just proximal to the toes and the left third toe 2 small open areas were identified 05/11/18; he has not uses compression pumps. The areas on the right lateral calf have coalesced into one large wound necrotic surface. On the left side he has one small wound anteriorly however the edema is now weeping out of a large part of his left leg. He says he wasn't using his pumps because of the weeping fluid. I explained to him that this is the time he needs to pump more 05/18/18; patient states he is using his compression pumps twice a day. The area on the right lateral large  wound albeit superficial. On the left side he has innumerable number of small new wounds on the left calf particularly laterally but several anteriorly and medially. All these appear to have healthy granulated base these look like the remnants of blisters however they occurred under compression. The patient arrives in clinic today with his legs somewhat better. There is certainly less edema, less multiple open areas on the left calf and the right anterior leg looks somewhat better as well superficial and a little smaller. However he relates pain and erythema over the last 3-4 days in the thigh  and I looked at this today. He has not been systemically unwell no fever no chills no change in blood sugar values 05/25/18; comes in today in a better state. The severe cellulitis on his left leg seems better with the Keflex. Not as tender. He has not been systemically unwell Hard to find an open wound on the left lower leg using his compression pumps twice a day The confluent wounds on his right lateral calf somewhat better looking. These will ultimately need debridement I didn't do this today. 06/01/18; the severe cellulitis on the left anterior thigh has resolved and he is completed his Keflex. There is no open wound on the left leg however there is a superficial excoriation at the base of the third toe dorsally. Skin on the bottom of his left foot is macerated looking. The left the wounds on the lateral right leg actually looks some better although he did require debridement of the top half of this wound area with an open curet 06/09/18 on evaluation today patient appears to be doing poorly in regard to his right lower extremity in particular this appears to likely be infected he has very thick purulent discharge along with a bright green tent to the discharge. This makes me concerned about the possibility of pseudomonas. He's also having increased discomfort at this point on evaluation. Fortunately there does not appear to be any evidence of infection spreading to the other location at this time. 06/16/18 on evaluation today patient appears to actually be doing fairly well. His ulcer has actually diminished in size quite significantly at this point which is good news. Nonetheless he still does have some evidence of infection he did see infectious disease this morning before coming here for his appointment. I did review the results of their evaluation and their note today. They did actually have him discontinue the Cipro and initiate treatment with linezolid at this time. He is doing this for the  next seven days and they recommended a follow-up in four months with them. He is the keep a log of the need for intermittent antibiotic therapy between now and when he falls back up with infectious disease. This will help them gaze what exactly they need to do to try and help them out. 06/23/18; the patient arrives today with no open wounds on the left leg and left third toe healed. He is been using his compression pumps twice a day. On the right lateral leg he still has a sizable wound but this is a lot better than last time I saw this. 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 Has been using silver alginate under 4 layer compression 06/30/18; the only open wound he has is on the right lateral leg and this looks healthy. No debridement is required. We have been using silver alginate.  He does not have an open wound on the left leg. There is apparently some drainage from the dorsal proximal third toe on the left although I see no open wound here. 07/03/18 on evaluation today patient was actually here just for a nurse visit rapid change. However when he was here on Wednesday for his rat change due to having been healed on the left and then developing blisters we initiated the wrap again knowing that he would be back today for [place] to reevaluate and see were at. Unfortunately he has developed some cellulitis into the proximal portion of his right lower extremity even into the region of his thigh. 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 He was placed on one as what at that point. Nonetheless he is done with that and has been tolerating it well otherwise. Doxycycline which in the past really did not seem to be effective for him. Nonetheless I think the best option may be for [place] to definitely reinitiate the antibiotics for a longer period of time. 07/07/18; since I  last saw this patient a week ago he has had a difficult time. At that point he did not have an open wound on his left leg. We transitioned him into juxta light stockings. He was apparently in the clinic the next day with blisters on the left lateral and left medial lower calf. He also had weeping edema fluid. He was put back into a compression wrap. He was also in the clinic on Friday with intense erythema in his right thigh. Per the patient he was started on Bactrim however that didn't work at all in terms of relieving his pain and swelling. He has taken 3 doxycycline that he had left over from last time and that seems to of helped. He has blistering on the right thigh as well. 07/14/18; the erythema on his right thigh has gotten better with doxycycline that he is finishing. 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 Presumably this cellulitis in the thigh was not related to that although I think this is a bit concerning going forward. He still has an area on the right lateral calf the blister on the right medial calf just below the knee that was discussed above. On the left 2 small open areas left medial and left lateral. Edema control is adequate. He is using his compression pumps twice a day 07/20/18; continued improvement in the condition of both legs especially the edema in his bilateral thighs. He tells me he is been losing weight through a combination of diet and exercise. He is using his compression pumps twice a day. So overall she made to the remaining wounds 07/27/2018; continued improvement in condition of both legs. His edema is well controlled. The area on the right lateral leg is just about closed he had one blisters show up on the medial left upper calf. We have him in 4 layer compression. He is going on a 10-day trip to [place]. He will be driving. He wants to wear Unna boots because of  the lessening amount of constriction. He will not use compression pumps while he is away 08/05/18 on evaluation today patient actually appears to be doing decently well all things considered in regard to his bilateral lower extremities. The worst ulcer is actually only posterior aspect of his left lower extremity  with a four layer compression wrap cut into his leg a couple weeks back. He did have a trip and actually had [employer] for the trip that he is worn since he was last here. Nonetheless he feels like the [employer] actually do better for him his swelling is up a little bit but he also with his trip was not taking his Lasix on a regular set schedule like he was supposed to be. He states that obviously the reason being that he cannot drive and keep going without having to urinate too frequently which makes it difficult. He did not have his pumps with him while he was away either which I think also maybe playing a role here too. 08/13/2018; the patient only has a small open wound on the right lateral calf which is a big improvement in the last month or 2. He also has the area posteriorly just below the posterior fossa on the left which I think was a wrap injury from several weeks ago. He has no current evidence of cellulitis. He tells me he is back into his compression pumps twice a day. He also tells me that while he was at the laundromat somebody stole a section of his extremitease stockings 08/20/2018; back in the clinic with a much improved state. He only has small areas on the right lateral mid calf which is just about healed. This was is more substantial area for quite a prolonged period of time. He has a small open area on the left anterior tibia. The area on the posterior calf just below the popliteal fossa is closed today. He is using his compression pumps twice a day 08/28/2018; patient has no open wound on the right leg. He has a smattering of open areas on the calf with some  weeping lymphedema. More problematically than that it looks as though his wraps of slipped down in his usual he has very angry upper area of edema just below the right medial knee and on the right lateral calf. He has no open area on his feet. The patient is traveling to [facility] next week. I will send him in an antibiotic. We will continue to wrap the right leg. We ordered extremitease stockings for him last week and I plan to transition the right leg to a stocking when he gets home which will be in 10 days time. As usual he is very reluctant to take his pumps with him when he travels 09/07/2018; patient returns from [facility]. He shows me a picture of his left leg in the mid part of his trip last week with intense fire engine erythema. The picture look bad enough I would have considered sending him to the hospital. Instead he went to the wound care center in [facility]. They did not prescribe him antibiotics but he did take some doxycycline he had leftover from a previous visit. I had given him trimethoprim sulfamethoxazole before he left this did not work according to the patient. This is resulted in some improvement fortunately. He comes back with a large wound on the left posterior calf. Smaller area on the left anterior tibia. Denuded blisters on the dorsal left foot over his toes. Does not have much in the way of wounds on the right leg although he does have a very tender area on the right posterior area just below the popliteal fossa also suggestive of infection. He promises me he is back on his pumps twice a day  09/15/2018; the intense cellulitis in his left lower calf is a lot better. The wound area on the posterior left calf is also so better. However he has reasonably extensive wounds on the dorsal aspect of his second and third toes and the proximal foot just at the base of the toes. There is nothing open on the right leg 09/22/2018; the patient has excellent edema  control in his legs bilaterally. He is using his external compression pumps twice a day. He has no open area on the right leg and only the areas in the left foot dorsally second and third toe area on the left side. He does not have any signs of active cellulitis. 10/06/2018; the patient has good edema control bilaterally. He has no open wound on the right leg. There is a blister in the posterior aspect of his left calf that we had to deal with today. He is using his compression pumps twice a day. There is no signs of active cellulitis. We have been using silver alginate to the wound areas. He still has vulnerable areas on the base of his left first second toes dorsally He has a his extremities stockings and we are going to transition him today into the stocking on the right leg. He is cautioned that he will need to continue to use the compression pumps twice a day. If he notices uncontrolled edema in the right leg he may need to go to 3 times a day. 10/13/2018; the patient came in for a nurse check on Friday he has a large flaccid blister on the right medial calf just below the knee. We unroofed this. He has this and a new area underneath the posterior mid calf which was undoubtedly a blister as well. He also has several small areas on the right which is the area we put his extremities stocking on. 10/19/2018; the patient went to see infectious disease this morning I am not sure if that was a routine follow-up in any case the doxycycline I had given him was discontinued and started on linezolid. He has not started this. It is easy to look at his left calf and the inflammation and think this is cellulitis however he is very tender in the tissue just below the popliteal fossa and I have no doubt that there is infection going on here. He states the problem he is having is that with the compression pumps the edema goes down and then starts walking the wrap falls down. We will see if we can adhere this. He  has 1 or 2 minuscule open areas on the right still areas that are weeping on the posterior left calf, the base of his left second and third toes 10/26/18; back today in clinic with quite of skin breakdown in his left anterior leg. This may have been infection the area below the popliteal fossa seems a lot better however tremendous epithelial loss on the left anterior mid tibia area over quite inexpensive tissue. He has 2 blisters on the right side but no other open wound here. 10/29/2018; came in urgently to see [place] today and we worked him in for review. He states that the 4 layer compression on the right leg caused pain he had to cut it down to roughly his mid calf this caused swelling above the wrap and he has blisters and skin breakdown today. As a result of the pain he has not been using his pumps. Both legs are a lot more edematous and there is  a lot of weeping fluid. 11/02/18; arrives in clinic with continued difficulties in the right leg> left. Leg is swollen and painful. multiple skin blisters and new open areas especially laterally. He has not been using his pumps on the right leg. 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 He is not systemically unwell. 11/09/2018; the patient claims he is being compliant with his pumps. He is finished the doxycycline I gave him last week. Culture I did of the wound on the right lateral leg showed a few very resistant methicillin staph aureus. This was resistant to doxycycline. Nevertheless he states the pain in the leg is a lot better which makes me wonder if the cultured organism was not really what was causing the problem nevertheless this is a very dangerous organism to be culturing out of any wound. His right leg is still a lot larger than the left. He is using an Radio broadcast assistant on this area, he blames a 4-layer compression for causing the original skin breakdown which I doubt is true however I cannot talk him out  of it. We have been using silver alginate to all of these areas which were initially blisters 11/16/2018; patient is being compliant with his external compression pumps at twice a day. Miraculously he arrives in clinic today with absolutely no open wounds. He has better edema control on the left where he has been using 4 layer compression versus wound of wounds on the right and I pointed this out to him. There is no inflammation in the skin in his lower legs which is also somewhat unusual for him. There is no open wounds on the dorsal left foot. He has extremitease stockings at home and I have asked him to bring these in next week. 11/25/18 patient's lower extremity on examination today on the left appears for the most part to be wound free. He does have an open wound on the lateral aspect of the right lower extremity but this is minimal compared to what I've seen in past. He does request that we go ahead and wrap the left leg as well even though there's nothing open just so hopefully it will not reopen in short order. 1/28; patient has superficial open wounds on the right lateral calf left anterior calf and left posterior calf. His edema control is adequate. 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 We have been using silver alginate as the primary dressing. He claims compliance with his compression pumps 2/4; patient has superficial open wounds on numerous areas of his left calf and again one on the left dorsal foot. The areas on the right lateral calf have healed. The cellulitis that I gave him doxycycline for last week is also resolved this was mostly on the left anterior calf just below the tibial tuberosity. His edema looks fairly well-controlled. He tells me he went to see his primary doctor today and had blood work ordered 2/11; once again he has several open areas on the left calf left tibial area. Most of  these are small and appear to have healthy granulation. He does not have anything open on the right. The edema and control in his thighs is pretty good which is usually a good indication he has been using his pumps as requested. 2/18; he continues to have several small areas on the left calf and left tibial area. Most of these  are small healthy granulation. We put him in his stocking on the right leg last week and he arrives with a superficial open area over the right upper tibia and a fairly large area on the right lateral tibia in similar condition. His edema control actually does not look too bad, he claims to be using his compression pumps twice a day 2/25. Continued small areas on the left calf and left tibial area. New areas especially on the right are identified just below the tibial tuberosity and on the right upper tibia itself. There are also areas of weeping edema fluid even without an obvious wound. He does not have a considerable degree of lymphedema but clearly there is more edema here than his skin can handle. He states he is using the pumps twice a day. We have an Unna boot on the right and 4 layer compression on the left. 3/3; he continues to have an area on the right lateral calf and right posterior calf just below the popliteal fossa. There is a fair amount of tenderness around the wound on the popliteal fossa but I did not see any evidence of cellulitis, could just be that the wrap came down and rubbed in this area. He does not have an open area on the left leg however there is an area on the left dorsal foot at the base of the third toe We have been using silver alginate to all wound areas 3/10; he did not have an open area on his left leg last time he was here a week ago. T oday he arrives with a horizontal wound just below the tibial tuberosity and an area on the left lateral calf. He has intense erythema and tenderness in this area. The area is on the right lateral calf and  right posterior calf better than last week. We have been using silver alginate as usual 3/18 - Patient returns with 3 small open areas on left calf, and 1 small open area on right calf, the skin looks ok with no significant erythema, he continues the UNA boot on right and 4 layer compression on left. The right lateral calf wound is closed , the right posterior is small area. we will continue silver alginate to the areas. 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 He is been tolerating the dressing changes without complication. Fortunately she has made excellent improvement in regard to the overall status of his wounds. Unfortunately every time we cease wrapping him he ends up reopening in causing more significant issues at that point. Again I'm unsure of the best direction to take although I think the lymphedema clinic may be appropriate for him. 02/03/19 on evaluation today patient appears to be doing well in regard to the wounds that we saw him for last week unfortunately he has a new area on the proximal portion of his right medial/posterior lower extremity where the wrap somewhat slowed down and caused swelling and a blister to rub and open. Unfortunately this is the only opening that he has on either leg at this point. 02/17/19 on evaluation today patient's bilateral lower extremities appear to be doing well. He still completely healed in regard to the left lower extremity. In regard to the right lower extremity the area where the wrap and slid down and caused the blister  still seems to be slightly open although this is dramatically better than during the last evaluation two weeks ago. I'm very pleased with the way this stands overall. 03/03/19 on evaluation today patient appears to be doing well in regard  to his right lower extremity in general although he did have a new blister open this does not appear to be showing any evidence of active infection at this time. Fortunately there's No fevers, chills, nausea, or vomiting noted at this time. Overall I feel like he is making good progress it does feel like that the right leg will we perform the [employer] seems to do with a bit better than three layer wrap on the left which slid down on him. We may switch to doing bilateral in the book wraps. 5/4; I have not seen Mr. Pareja in quite some time. According to our case manager he did not have an open wound on his left leg last week. He had 1 remaining wound on the right posterior medial calf. He arrives today with multiple openings on the left leg probably were blisters and/or wrap injuries from Unna boots. I do not think the Unna boot's will provide adequate compression on the left. I am also not clear about the frequency he is using the compression pumps. 03/17/19 on evaluation today patient appears to be doing excellent in regard to his lower extremities compared to last week's evaluation apparently. He had gotten significantly worse last week which is unfortunate. The [employer] wrap on the left did not seem to do very well for him at all and in fact it didn't control his swelling significantly enough he had an additional outbreak. Subsequently we go back to the four layer compression wrap on the left. This is good news. At least in that he is doing better and the wound seem to be killing him. He still has not heard anything from the lymphedema clinic. 03/24/19 on evaluation today patient actually appears to be doing much better in regard to his bilateral lower Trinity as compared to last week when I saw him. Fortunately there's no signs of active infection at this time. He has been tolerating the dressing changes without complication. Overall I'm extremely pleased with the progress and appearance in  general. 04/07/19 on evaluation today patient appears to be doing well in regard to his bilateral lower extremities. His swelling is significantly down from where it was previous. With that being said he does have a couple blisters still open at this point but fortunately nothing that seems to be too severe and again the majority of the larger openings has healed at this time. 04/14/19 on evaluation today patient actually appears to be doing quite well in regard to his bilateral lower extremities in fact I'm not even sure there's anything significantly open at this time at any site. Nonetheless he did have some trouble with these wraps where they are somewhat irritating him secondary to the fact that he has noted that the graph wasn't too close down to the end of this foot in a little bit short as well up to his knee. Otherwise things seem to be doing quite well. 04/21/19 upon evaluation today patient's wound bed actually showed evidence of being completely healed in regard to both lower extremities which is excellent news. There does not appear to be any signs of active infection which is also good news. I'm very pleased in this regard. No fevers, chills, nausea, or vomiting noted at this time. 04/28/19  on evaluation today patient appears to be doing a little bit worse in regard to both lower extremities on the left mainly due to the fact that when he went infection disease the wrap was not wrapped quite high enough he developed a blister above this. On the right he is a small open area of nothing too significant but again this is continuing to give him some trouble he has been were in the Velcro compression that he has at home. 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 He's been tolerating the dressing changes without complication. Fortunately there's no signs of active infection at this time. No fevers, chills, nausea, or vomiting noted at  this time. We have been trying to get an appointment with her lymphedema clinic in [facility] but unfortunately nobody can get them on phone with not been able to even fax information over the patient likewise is not been able to get in touch with them. Overall I'm not sure exactly what's going on here with to reach out again today. 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 Still having a lot of drainage unfortunately. He tells me especially in the left but even on the right. There's no signs of active infection which is good news we've been using so ratcheted up to this point. 05/19/19 on evaluation today patient actually appears to be doing quite well with regard to his left lower extremity which is great news. Fortunately in regard to the right lower extremity has an issues with his wrap and he subsequently did remove this from what I'm understanding. Nonetheless long story short is what he had rewrapped once he removed it subsequently had maggots underneath this wrap whenever he came in for evaluation today. With that being said they were obviously completely cleaned away by the nursing staff. The visit today which is excellent news. However he does appear to potentially have some infection around the right ankle region where the maggots were located as well. He will likely require anabiotic therapy today. 05/26/19 on evaluation today patient actually appears to be doing much better in regard to his bilateral lower extremities. I feel like the infection is under much better control. With that being said there were maggots noted when the wrap was removed yet again today. Again this could have potentially been left over from previous although at this time there does not appear to be any signs of significant drainage there was obviously on the wrap some drainage as well this contracted gnats or  otherwise. Either way I do not see anything that appears to be doing worse in my pinion and in fact I think his drainage has slowed down quite significantly likely mainly due to the fact to his infection being under better control. 06/02/2019 on evaluation today patient actually appears to be doing well with regard to his bilateral lower extremities there is no signs of active infection at this time which is great news. With that being said he does have several open areas more so on the right than the left but nonetheless these are all significantly better than previously noted. 06/09/2019 on evaluation today patient actually appears to be doing well. His wrap stayed up and he did not cause any problems he had more drainage on the right compared to the left but overall I do not see any major issues at  this time which is great news. 06/16/2019 on evaluation today patient appears to be doing excellent with regard to his lower extremities the only area that is open is a new blister that can have opened as of today on the medial ankle on the left. Other than this he really seems to be doing great I see no major issues at this point. 06/23/2019 on evaluation today patient appears to be doing quite well with regard to his bilateral lower extremities. In fact he actually appears to be almost completely healed there is a small area of weeping noted of the right lower extremity just above the ankle. Nonetheless fortunately there is no signs of active infection at this time which is good news. No fevers, chills, nausea, vomiting, or diarrhea. 8/24; the patient arrived for a nurse visit today but complained of very significant pain in the left leg and therefore I was asked to look at this. Noted that he did not have an open area on the left leg last week nevertheless this was wrapped. The patient states that he is not been able to put his compression pumps on the left leg because of the discomfort. He has not been  systemically unwell 06/30/2019 on evaluation today patient unfortunately despite being excellent last week is doing much worse with regard to his left lower extremity today. In fact he had to come in for a nurse on Monday where his left leg had to be rewrapped due to excessive weeping Dr. Leanord Hawking placed him on doxycycline at that point. Fortunately there is no signs of active infection Systemically at this time which is good news. 07/07/2019 in regard to the patient's wounds today he actually seems to be doing well with his right lower extremity there really is nothing open or draining at this point this is great news. Unfortunately the left lower extremity is given him additional trouble at this time. There does not appear to be any signs of active infection nonetheless he does have a lot of edema and swelling noted at this point as well as blistering all of which has led to a much more poor appearing leg at this time compared to where it was 2 weeks ago when it was almost completely healed. Obviously this is a little discouraging for the patient. He is try to contact the lymphedema clinic in [place] he has not been able to get through to them. 07/14/2019 on evaluation today patient actually appears to be doing slightly better with regard to his left lower extremity ulcers. Overall I do feel like at least at the top of the wrap that we have been placing this area has healed quite nicely and looks much better. The remainder of the leg is showing signs of improvement. Unfortunately in the thigh area he still has an open region on the left and again on the right he has been utilizing just a Band-Aid on an area that also opened on the thigh. Again this is an area that were not able to wrap although we did do an Ace wrap to provide some compression that something that obviously is a little less effective than the compression wraps we have been using on the lower portion of the leg. He does have an  appointment with the lymphedema clinic in [facility] on Friday. 07/21/2019 on evaluation today patient appears to be doing better with regard to his lower extremity ulcers. He has been tolerating the dressing changes without complication. Fortunately there is no signs of active  infection at this time. No fevers, chills, nausea, vomiting, or diarrhea. I did receive the paperwork from the physical therapist at the lymphedema clinic in [place]. Subsequently I signed off on that this morning and sent that back to him for further progression with the treatment plan. 07/28/2019 on evaluation today patient appears to be doing very well with regard to his right lower extremity where I do not see any open wounds at this point. Fortunately he is feeling great as far as that is concerned as well. In regard to the left lower extremity he has been having issues with still several areas of weeping and edema although the upper leg is doing better his lower leg still I think is going require the compression wrap at this time. No fevers, chills, nausea, vomiting, or diarrhea. 08/04/2019 on evaluation today patient unfortunately is having new wounds on the right lower extremity. Again we have been using Unna boot wrap on that side. We switched him to using his juxta lite wrap at home. With that being said he tells me he has been using it although his legs extremely swollen and to be honest really does not appear that he has been. I cannot know that for sure however. Nonetheless he has multiple new wounds on the right lower extremity at this time. Obviously we will have to see about getting this rewrapped for him today. 08/11/2019 on evaluation today patient appears to be doing fairly well with regard to his wounds. He has been tolerating the dressing changes including the compression wraps without complication. He still has a lot of edema in his upper thigh regions bilaterally he is supposed to be seeing the  lymphedema clinic on the 15th of this month once his wraps arrive for the upper part of his legs. 08/18/2019 on evaluation today patient appears to be doing well with regard to his bilateral lower extremities at this point. He has been tolerating the dressing changes without complication. Fortunately there is no signs of active infection which is also good news. He does have a couple weeping areas on the first and second toe of the right foot he also has just a small area on the left foot upper leg and a small area on the left lower leg but overall he is doing quite well in my opinion. He is supposed to be getting his wraps shortly in fact tomorrow and then subsequently is seeing the lymphedema clinic next Wednesday on the 21st. Of note he is also leaving on the 25th to go on vacation for a [place] to the beach. For that reason and since there is some uncertainty about what there can be doing at lymphedema clinic next Wednesday I am get a make an appointment for next Friday here for [place] to see what we need to do for him prior to him leaving for vacation. 10/23; patient arrives in considerable pain predominantly in the upper posterior calf just distal to the popliteal fossa also in the wound anteriorly above the major wound. This is probably cellulitis and he has had this recurrently in the past. He has no open wound on the right side and he has had an Radio broadcast assistant in that area. Finally I note that he has an area on the left posterior calf which by enlarge is mostly epithelialized. This protrudes beyond the borders of the surrounding skin in the setting of dry scaly skin and lymphedema. The patient is leaving for [facility] on Sunday. Per his longstanding pattern, he  will not take his compression pumps with him predominantly out of fear that they will be stolen. He therefore asked that we put a Unna boot back on the right leg. He [place] also [place] if [place] can  change his dressing in the mid week. 11/3; [place] He was seen on 1 occasion at their wound care center. They did a 2 layer compression system as they did not have our 4-layer wrap. I am not completely certain what they put on the wounds. They did not change the Unna boot on the right. The patient is also seeing a lymphedema specialist [place]. It appears that he has some compression sleeve for his thighs which indeed look quite a bit better than I am used to seeing. He pumps over these with his external compression pumps. 11/10; the patient has a new wound on the right medial thigh otherwise there is no open areas on the right. He has an area on the left leg posteriorly anteriorly and medially and an area over the left second toe. We have been using silver alginate. He thinks the injury on his thigh is secondary to friction from the compression sleeve he has. 11/17; the patient has a new wound on the right medial thigh last week. He thinks this is because he did not have a underlying stocking for his thigh juxta lite apparatus. He now has this. The area is fairly large and somewhat angry but I do not think he has underlying cellulitis. He has a intact blister on the right anterior tibial area. Small wound on the right great toe dorsally Small area on the medial left calf. 11/30; the patient does not have any open areas on his right leg and we did not take his juxta lite stocking off. However he states that on Friday his compression wrap fell down lodging around his upper mid calf area. As usual this creates a lot of problems for him. He called urgently today to be seen for a nurse visit however the nurse visit turned into a provider visit because of extreme erythema and pain in the left anterior tibia extending laterally and posteriorly. The area that is problematic is extensive 10/06/2019 upon evaluation today patient actually  appears to be doing poorly in regard to his left lower extremity. He Dr. Leanord Hawkingobson did place him on doxycycline this past Monday apparently due to the fact that he was doing much worse in regard to this left leg. Fortunately the doxycycline does seem to be helping. Unfortunately we are still having a very difficult time getting his edema under any type of control in order to anticipate discharge at some point. The only way were really able to control his lymphedema really is with compression wraps and that has only even seemingly temporary. He has been seeing a lymphedema clinic they are trying to help in this regard but still this has been somewhat frustrating in general for the patient. 10/13/19 on evaluation today patient appears to be doing excellent with regard to his right lower extremity as far as the wounds are concerned. His swelling is still quite extensive unfortunately. He is still having a lot of drainage from the thigh areas bilaterally which is unfortunate. He's been going to lymphedema clinic but again he still really does not have this edema under control as far as his lower extremities are concern. With regard to his left lower  extremity this seems to be improving and I do believe the doxycycline has been of benefit for him. He is about to complete the doxycycline. 10/20/2019 on evaluation today patient appears to be doing poorly in regard to his bilateral lower extremities. More in the right thigh he has a lot of irritation at this site unfortunately. In regard to the left lower extremity the wrap was not quite as high it appears and does seem to have caused him some trouble as well. Fortunately there is no evidence of systemic infection though he does have some blue-green drainage which has me concerned for the possibility of Pseudomonas. He tells me he is previously taking Cipro without complications and he really does not care for Levaquin however due to some of the side effects he  has. He is not allergic to any medications specifically antibiotics that were aware of. 10/27/2019 on evaluation today patient actually does appear to be for the most part doing better when compared to last week's evaluation. With that being said he still has multiple open wounds over the bilateral lower extremities. He actually forgot to start taking the Cipro and states that he still has the whole bottle. He does have several new blisters on left lower extremity today I think I would recommend he go ahead and take the Cipro based on what I am seeing at this point. 12/30-Patient comes at 1 week visit, 4 layer compression wraps on the left and Unna boot on the right, primary dressing Xtrasorb and silver alginate. Patient is taking his Cipro and has a few more days left probably 5-6, and the legs are doing better. He states he is using his compressions devices which I believe he has 11/10/2019 on evaluation today patient actually appears to be much better than last time I saw him 2 weeks ago. His wounds are significantly improved and overall I am very pleased in this regard. Fortunately there is no signs of active infection at this time. He is just a couple of days away from completing Cipro. Overall his edema is much better he has been using his lymphedema pumps which I think is also helping at this point. 11/17/2019 on evaluation today patient appears to be doing excellent in regard to his wounds in general. His legs are swollen but not nearly as much as they have been in the past. Fortunately he is tolerating the compression wraps without complication. No fevers, chills, nausea, vomiting, or diarrhea. He does have some erythema however in the distal portion of his right lower extremity specifically around the forefoot and toes there is a little bit of warmth here as well. 11/24/2019 on evaluation today patient appears to be doing well with regard to his right lower extremity I really do not see any open  wounds at this point. His left lower extremity does have several open areas and his right medial thigh also is open. Other than this however overall the patient seems to be making good progress and I am very pleased at this point. 12/01/2019 on evaluation today patient appears to be doing poorly at this point in regard to his left lower extremity has several new blisters despite the fact that we have him in compression wraps. In fact he had a 4-layer compression wrap, his upper thigh wrapped from lymphedema clinic, and a juxta light over top of the 4 layer compression wrap the lymphedema clinic applied and despite all this he still develop blisters underneath. Obviously this does have me concerned about  the fact that unfortunately despite what we are doing to try to get wounds healed he continues to have new areas arise I do not think he is ever good to be at the point where he can realistically just use wraps at home to keep things under control. Typically when we heal him it takes about 1-2 days before he is back in the clinic with severe breakdown and blistering of his lower extremities bilaterally. This is happened numerous times in the past. Unfortunately I think that we may need some help as far as overall fluid overload to kind of limit what we are seeing and get things under better control. 12/08/2019 on evaluation today patient presents for follow-up concerning his ongoing bilateral lower extremity edema. Unfortunately he is still having quite a bit of swelling the compression wraps are controlling this to some degree but he did see Dr. [name] his cardiologist I do have that available for review today as far as the appointment was concerned that was on 12/06/2019. Obviously that she has been 2 days ago. The patient states that he is only been taking the Lasix 80 mg 1 time a day he had told me previously he was taking this twice a day. Nonetheless Dr. [name] recommended this be up to 80 mg 2 times a  day for the patient as he did appear to be fluid overloaded. With that being said the patient states he did this yesterday and he was unable to go anywhere or do anything due to the fact that he was constantly having to urinate. Nonetheless I think that this is still good to be something that is important for him as far as trying to get his edema under control at all things that he is going to be able to just expect his wounds to get under control and things to be better without going through at least a period of time where he is trying to stabilize his fluid management in general and I think increasing the Lasix is likely the first step here. It was also mentioned the possibility that the patient may require metolazone. With that being said he wanted to have the patient take Lasix twice a day first and then reevaluating 2 months to see where things stand. 12/15/2019 upon evaluation today patient appears to be doing regard to his legs although his toes are showing some signs of weeping especially on the left at this point to some degree on the right. There does not appear to be any signs of active infection and overall I do feel like the compression wraps are doing well for him but he has not been able to take the Lasix at home and the increased dose that Dr. [name] recommended. He tells me that just not go to be feasible for him. Nonetheless I think in this case he should probably send a message to Dr. [name] in order to discuss options from the standpoint of possible admission to get the fluid off or otherwise going forward. 12/22/2019 upon evaluation today patient appears to be doing fairly well with regard to his lower extremities at this point. In fact he would be doing excellent if it was not for the fact that his right anterior thigh apparently had an allergic reaction to adhesive tape that he used. The wound itself that we have been monitoring actually appears to be healed. There is a lot of  irritation at this point. 12/29/2019 upon evaluation today patient appears to be doing well in  regard to his lower extremities. His left medial thigh is open and somewhat draining today but this is the only region that is open the right has done much better with the treatment utilizing the steroid cream that I prescribed for him last week. Overall I am pleased in that regard. Fortunately there is no signs of active infection at this time. No fevers, chills, nausea, vomiting, or diarrhea. 01/05/2020 upon evaluation today patient appears to be doing more poorly in regard to his right lower extremity at this point upon evaluation today. Unfortunately he continues to have issues in this regard and I think the biggest issue is controlling his edema. This obviously is not very well controlled at this point is been recommended that he use the Lasix twice a day but he has not been able to do that. Unfortunately I think this is leading to an issue where honestly he is not really able to effectively control his edema and therefore the wounds really are not doing significantly better. I do not think that he is going to be able to keep things under good control unless he is able to control his edema much better. I discussed this again in great detail with him today. 01/12/2020 good news is patient actually appears to be doing quite well today at this point. He does have an appointment with lymphedema clinic tomorrow. His legs appear healed and the toe on the left is almost completely healed. In general I am very pleased with how things stand at this point. 01/19/2020 upon evaluation today patient appears to actually be doing well in regard to his lower extremities there is nothing open at this point. Fortunately he has done extremely well more recently. Has been seeing lymphedema clinic as well. With that being said he has Velcro wraps for his lower legs as well as his upper legs. The only wound really is on his toe  which is the right great toe and this is barely anything even there. With all that being said I think it is good to be appropriate today to go ahead and switch him over to the Velcro compression wraps. 01/26/2020 upon evaluation today patient appears to be doing worse with regard to his lower extremities after last week switch him to Velcro compression wraps. Unfortunately he lasted less than 24 hours he did not have the sock portion of his Velcro wrap on the left leg and subsequently developed a blister underneath the Velcro portion. Obviously this is not good and not what we were looking for at this point. He states the lymphedema clinic did tell him to wear the wrap for 23 hours and take him off for 1 I am okay with that plan but again right now we got a get things back under control again he may have some cellulitis noted as well. 02/02/2020 upon evaluation today patient unfortunately appears to have several areas of blistering on his bilateral lower extremities today mainly on the feet. His legs do seem to be doing somewhat better which is good news. Fortunately there is no evidence of active infection at this time. No fevers, chills, nausea, vomiting, or diarrhea. 02/16/2020 upon evaluation today patient appears to be doing well at this time with regard to his legs. He has a couple weeping areas on his toes but for the most part everything is doing better and does appear to be sealed up on his legs which is excellent news. We can continue with wrapping him at this point as  he had every time we discontinue the wraps he just breaks out with new wounds. There is really no point in is going forward with this at this point. 03/08/2020 upon evaluation today patient actually appears to be doing quite well with regard to his lower extremity ulcers. He has just a very superficial and really almost nonexistent blister on the left lower extremity he has in general done very well with the compression wraps. With  that being said I do not see any signs of infection at this time which is good news. 03/29/2020 upon evaluation today patient appears to be doing well with regard to his wounds currently except for where he had several new areas that opened up due to some of the wrap slipping and causing him trouble. He states he did not realize they had slipped. Nonetheless he has a 1 area on the right and 3 new areas on the left. Fortunately there is no signs of active infection at this time which is great news. 04/05/2020 upon evaluation today patient actually appears to be doing quite well in general in regard to his legs currently. Fortunately there is no signs of active infection at this time. No fevers, chills, nausea, vomiting, or diarrhea. He tells me next week that he will actually be seen in the lymphedema clinic on Thursday at 10 AM I see him on Wednesday next week. 04/12/2020 upon evaluation today patient appears to be doing very well with regard to his lower extremities bilaterally. In fact he does not appear to have any open wounds at this point which is good news. Fortunately there is no signs of active infection at this time. No fevers, chills, nausea, vomiting, or diarrhea. 04/19/2020 upon evaluation today patient appears to be doing well with regard to his wounds currently on the bilateral lower extremities. There does not appear to be any signs of active infection at this time. Fortunately there is no evidence of systemic infection and overall very pleased at this point. Nonetheless after I held him out last week he literally had blisters the next morning already which swelled up with him being right back here in the clinic. Overall I think that he is just not can be able to be discharged with his legs the way they are he is much to volume overloaded as far as fluid is concerned and that was discussed with him today of also discussed this but should try the clinic nurse manager as well as Dr.  Leanord Hawking. 04/26/2020 upon evaluation today patient appears to be doing better with regard to his wounds currently. He is making some progress and overall swelling is under good control with the compression wraps. Fortunately there is no evidence of active infection at this time. 05/10/2020 on evaluation today patient appears to be doing overall well in regard to his lower extremities bilaterally. He is Tolerating the compression wraps without complication and with what we are seeing currently I feel like that he is making excellent progress. There is no signs of active infection at this time. 05/24/2020 upon evaluation today patient appears to be doing well in regard to his legs. The swelling is actually quite a bit down compared to where it has been in the past. Fortunately there is no sign of active infection at this time which is also good news. With that being said he does have several wounds on his toes that have opened up at this point. 05/31/2020 upon evaluation today patient appears to be doing well with  regard to his legs bilaterally where he really has no significant fluid buildup at this point overall he seems to be doing quite well. Very pleased in this regard. With regard to his toes these also seem to be drying up which is excellent. We have continue to wrap him as every time we tried as a transition to the juxta light wraps things just do not seem to get any better. 06/07/2020 upon evaluation today patient appears to be doing well with regard to his right leg at this point. Unfortunately left leg has a lot of blistering he tells me the wrap started to slide down on him when he tried to put his other Velcro wrap over top of it to help keep things in order but nonetheless still had some issues. 06/14/2020 on evaluation today patient appears to be doing well with regard to his lower extremity ulcers and foot ulcers at this point. I feel like everything is actually showing signs of improvement which  is great news overall there is no signs of active infection at this time. No fevers, chills, nausea, vomiting, or diarrhea. 06/21/2020 on evaluation today patient actually appears to be doing okay in regard to his wounds in general. With that being said the biggest issue I see is on his right foot in particular the first and second toe seem to be doing a little worse due to the fact this is staying very wet. I think he is probably getting need to change out his dressings a couple times in between each week when we see him in regard to his toes in order to keep this drier based on the location and how this is proceeding. 06/28/2020 on evaluation today patient appears to be doing a little bit more poorly overall in regard to the appearance of the skin I am actually somewhat concerned about the possibility of him having a little bit of an infection here. We discussed the course of potentially giving him a doxycycline prescription which he is taken previously with good result. With that being said I do believe that this is potentially mild and at this point easily fixed. I just do not want anything to get any worse. 07/12/2020 upon evaluation today patient actually appears to be making some progress with regard to his legs which is great news there does not appear to be any evidence of active infection. Overall very pleased with where things stand. 07/26/2020 upon evaluation today patient appears to be doing well with regard to his leg ulcers and toe ulcers at this point. He has been tolerating the compression wraps without complication overall very pleased in this regard. 08/09/2020 upon evaluation today patient appears to be doing well with regard to his lower extremities bilaterally. Fortunately there is no signs of active infection overall I am pleased with where things stand. 08/23/2020 on evaluation today patient appears to be doing well with regard to his wound. He has been tolerating the dressing  changes without complication. Fortunately there is no signs of active infection at this time. Overall his legs seem to be doing quite well which is great news and I am very pleased in that regard. No fevers, chills, nausea, vomiting, or diarrhea. 09/13/2020 upon evaluation today patient appears to be doing okay in regard to his lower extremities. He does have a fairly large blister on the right leg which I did remove the blister tissue from today so we can get this to dry out other than that however he seems  to be doing quite well. There is no signs of active infection at this time. 09/27/2020 upon evaluation today patient appears to actually be doing some better in regard to his right leg. Fortunately signs of active infection at this time which is great news. No fevers, chills, nausea, vomiting, or diarrhea. 10/04/2020 upon evaluation today patient actually appears to be showing signs of improvement which is great news with regard to his leg ulcers. Fortunately there is no signs of active infection which is great news he is still taking the antibiotics currently. No fevers, chills, nausea, vomiting, or diarrhea. 10/18/2020 on evaluation today patient appears to be doing well with regard to his legs currently. He has been tolerating the dressing changes including the wraps without complication. Fortunately there is no signs of active infection at this time. No fevers, chills, nausea, vomiting, or diarrhea. 10/25/2020 upon evaluation today patient appears to be doing decently well in regard to his wounds currently. He has been tolerating the dressing changes without complication. Overall I feel like he is making good progress albeit slow. Again this is something we can have to continue to wrap for some time to come most likely. 11/08/2020 upon evaluation today patient appears to be doing well with regard to his wounds currently. He has been tolerating the dressing changes without complication is not  currently on any antibiotics and he does not appear to show any signs of infection. He does continue to have a lot of drainage on the right leg not too severe but nonetheless this is very scattered. On the left leg this is looking to be much improved overall. 11/15/2020 upon evaluation today patient appears to be doing better with regard to his legs bilaterally. Especially the right leg which was much more significant last week. There does not appear to be any signs of active infection which is great news. No fevers, chills, nausea, vomiting, or diarrhea. 11/23/2019 upon evaluation today patient appears to be doing poorly still in regard to his lower extremities bilaterally. Unfortunately his right leg in particular appears to be doing much more poorly there is no signs really of infection this is not warm to touch but he does have a lot of drainage and weeping unfortunately. With that reason I do believe that we may need to initiate some treatment here to try to help calm down some of the swelling of the right leg. I think switching to a 4-layer compression wrap would be beneficial here. The patient is in agreement with giving this a try. Electronic Signature(s) Signed: 11/22/2020 11:24:26 AM By: Lenda Kelp PA-C Entered By: Lenda Kelp on 11/22/2020 11:24:26 -------------------------------------------------------------------------------- Physical Exam Details Patient Name: Date of Service: [name], Shyler J. 11/22/2020 10:30 A M Medical Record Number: [id number] Patient Account Number: [id number] Date of Birth/Sex: Treating RN: 09/09/1951 (70 y.o. [name] Primary Care Provider: Nicoletta Ba Other Clinician: Referring Provider: Treating Provider/Extender: Adele Dan in Treatment: 252 Constitutional Obese and well-hydrated in no acute distress. Respiratory normal breathing without difficulty. Psychiatric this patient is able to make decisions and  demonstrates good insight into disease process. Alert and Oriented x 3. pleasant and cooperative. Notes Upon inspection patient's wound bed actually showed signs of fairly good granulation and epithelization at all locations. The right leg still is weeping pretty much circumferentially. I believe we need to get more of the edema under control here. I discussed that with the patient today. He is in agreement with attempting a  4-layer compression wrap which I think would be superior to the [employer] we have been using currently. Electronic Signature(s) Signed: 11/22/2020 11:24:48 AM By: Lenda Kelp PA-C Entered By: Lenda Kelp on 11/22/2020 11:24:47 -------------------------------------------------------------------------------- Physician Orders Details Patient Name: Date of Service: [name], Lytle J. 11/22/2020 10:30 A M Medical Record Number: [id number] Patient Account Number: [id number] Date of Birth/Sex: Treating RN: 10/11/1951 (70 y.o. 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 Bathing/ Shower/ Hygiene May shower with protection but do not get wound dressing(s) wet. Edema Control - Lymphedema / SCD / Other Bilateral Lower Extremities Lymphedema Pumps. Use  Lymphedema pumps on leg(s) 2-3 times a day for 45-60 minutes. If wearing any wraps or hose, do not remove them. Continue exercising as instructed. Elevate legs to the level of the heart or above for 30 minutes daily and/or when sitting, a frequency of: - throughout the day Avoid standing for long periods of time. Exercise regularly Other Edema Control Orders/Instructions: - 4 layer compression wrap to left leg Non Wound Condition Left Lower Extremity Other Non Wound Condition Orders/Instructions: - lotion to leg, 4 layer compression wrap Wound Treatment Wound #185 - Lower Leg Wound Laterality: Right, Circumferential Peri-Wound Care: Zinc Oxide Ointment 30g tube 1 x Per Week Discharge Instructions: Apply Zinc Oxide to periwound with each dressing change Peri-Wound Care: Sween Lotion (Moisturizing lotion) 1 x Per Week Discharge Instructions: Apply moisturizing lotion as directed Prim Dressing: KerraCel Ag Gelling Fiber Dressing, 4x5 in (silver alginate) 1 x Per Week ary Discharge Instructions: Apply silver alginate to wound bed as instructed Secondary Dressing: ABD Pad, 8x10 1 x Per Week Discharge Instructions: Apply over primary dressing as directed. Secondary Dressing: CarboFLEX Odor Control Dressing, 4x4 in 1 x Per Week Discharge Instructions: Apply over primary dressing as directed. Compression Wrap: Unnaboot w/Calamine, 4x10 (in/yd) 1 x Per Week Discharge Instructions: Apply Unnaboot as directed. Wound #189 - Forearm Wound Laterality: Dorsal, Right Secondary Dressing: ComfortFoam Border, 4x4 in (silicone border) 1 x Per Week/7 Days Discharge Instructions: Apply over primary dressing as directed. Wound #190 - T Second oe Wound Laterality: Right Prim Dressing: KerraCel Ag Gelling Fiber Dressing, 2x2 in (silver alginate) 1 x Per Week/7 Days ary Discharge Instructions: Apply silver alginate to wound bed as instructed Secondary Dressing: Woven Gauze Sponges 2x2 in 1 x Per Week/7  Days Discharge Instructions: Apply over primary dressing as directed. Secured With: Insurance underwriter, Sterile 2x75 (in/in) 1 x Per Week/7 Days Discharge Instructions: Secure with stretch gauze as directed. Electronic Signature(s) Signed: 11/22/2020 12:00:41 PM By: Zenaida Deed RN, BSN Signed: 11/22/2020 6:05:28 PM By: Lenda Kelp PA-C Entered By: Zenaida Deed on 11/22/2020 11:25:17 -------------------------------------------------------------------------------- Problem List Details Patient Name: Date of Service: [facility], Pedram J. 11/22/2020 10:30 A M Medical Record Number: [id number] Patient Account Number: [id number] Date of Birth/Sex: Treating RN: 08-08-1951 (70 y.o. [name] Primary Care Provider: Nicoletta Ba  Other Clinician: Referring Provider: Treating Provider/Extender: Adele Dan in Treatment: 252 Active Problems ICD-10 Encounter Code Description Active Date MDM Diagnosis 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 skin 06/30/2018 No Yes L97.221 Non-pressure chronic ulcer of left calf limited to breakdown of skin 09/30/2016 No Yes 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 of 01/22/2016 No Yes bilateral lower extremity I89.0 Lymphedema, not elsewhere classified 01/22/2016 No Yes E11.622 Type 2 diabetes mellitus with other skin ulcer 01/22/2016 No Yes 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, unspecified 01/22/2016 No Yes L03.116 Cellulitis of left lower limb 04/01/2017 No Yes Inactive Problems ICD-10 Code Description Active Date Inactive Date 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 skin 06/30/2017 06/30/2017 L97.521 Non-pressure chronic ulcer of other 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 of skin 04/27/2018 04/27/2018 L03.115 Cellulitis of right lower limb 12/22/2017 12/22/2017 L97.228 Non-pressure chronic  ulcer of left calf with other specified severity 06/30/2018 06/30/2018 L97.511 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 of skin 06/30/2018 06/30/2018 Resolved Problems Electronic Signature(s) Signed: 11/22/2020 11:09:12 AM By: Lenda Kelp PA-C Entered By: Lenda Kelp on 11/22/2020 11:09:11 -------------------------------------------------------------------------------- Progress Note Details Patient Name: Date of Service: [name], Berthel J. 11/22/2020 10:30 A M Medical Record Number: [id number] Patient Account Number: [id number] Date of Birth/Sex: Treating RN: 1951-04-20 (70 y.o. [name] Primary Care Provider: Nicoletta Ba Other Clinician: Referring Provider: Treating Provider/Extender: Adele Dan in Treatment: 252 Subjective Chief Complaint Information obtained from Patient patient is here for evaluation venous/lymphedema weeping History of Present Illness (HPI) Referred by PCP for consultation. Patient has long standing history of BLE venous stasis, no prior ulcerations. At beginning of month, developed cellulitis and weeping. Received IM Rocephin followed by Keflex and resolved. Wears compression stocking, appr 6 months old. Not sure strength. No present drainage. 01/22/16 this is a patient who is a type II diabetic on insulin. 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 He tells me he religiously wears pressure stockings of uncertain strength. He was here with weeping edema about 8 months ago but did not have an open wound. Roughly a month ago he had a reopening on his bilateral legs. He is been using bandages and Neosporin. He does not complain of pain. 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 Last BUN/creatinine I have is from 11/20/15 at 13 and 1.0 respectively 01/29/16;  patient arrives today having tolerated the Profore wrap. He brought in his stockings and these are 18 mmHg stockings he bought from [employer]. The compression here is likely inadequate. He does not complain of pain or excessive drainage she has no systemic symptoms. The wound on the right looks improved as does the one on the left although one on the left is more substantial with still tissue at risk below the actual wound area on the bilateral posterior calf 02/05/16; patient arrives with poor edema control. He states that we did put a 4 layer compression on it last week. No weight appear 5 this. 02/12/16; the area on the posterior right Has healed. The left Has a substantial wound that has necrotic surface eschar that requires a debridement with a curette. 02/16/16;the patient called or a Nurse visit secondary to increased swelling. He had been in earlier in  the week with his right leg healed. He was transitioned to is on pressure stocking on the right leg with the only open wound on the left, a substantial area on the left posterior calf. Note he has a history of severe lower extremity edema, he has a history of chronic atrial fibrillation but not heart failure per my notes but I'll need to research this. He is not complaining of chest pain shortness of breath or orthopnea. The intake nurse noted blisters on the previously closed right leg 02/19/16; this is the patient's regular visit day. I see him on Friday with escalating edema new wounds on the right leg and clear signs of at least right ventricular heart failure. I increased his Lasix to 40 twice a day. He is returning currently in follow-up. States he is noticed a decrease in that the edema 02/26/16 patient's legs have much less edema. There is nothing really open on the right leg. The left leg has improved condition of the large superficial wound on the posterior left leg 03/04/16; edema control is very much better. The patient's right leg wounds have  healed. On the left leg he continues to have severe venous inflammation on the posterior aspect of the left leg. There is no tenderness and I don't think any of this is cellulitis. 03/11/16; patient's right leg is married healed and he is in his own stocking. The patient's left leg has deteriorated somewhat. There is a lot of erythema around the wound on the posterior left leg. There is also a significant rim of erythema posteriorly just above where the wrap would've ended there is a new wound in this location and a lot of tenderness. Can't rule out cellulitis in this area. 03/15/16; patient's right leg remains healed and he is in his own stocking. The patient's left leg is much better than last review. His major wound on the posterior aspect of his left Is almost fully epithelialized. He has 3 small injuries from the wraps. Really. Erythema seems a lot better on antibiotics 03/18/16; right leg remains healed and he is in his own stocking. The patient's left leg is much better. The area on the posterior aspect of the left calf is fully epithelialized. His 3 small injuries which were wrap injuries on the left are improved only one seems still open his erythema has resolved 03/25/16; patient's right leg remains healed and he is in his own stocking. There is no open area today on the left leg posterior leg is completely closed up. His wrap injuries at the superior aspect of his leg are also resolved. He looks as though he has some irritation on the dorsal ankle but this is fully epithelialized without evidence of infection. 03/28/16; we discharged this patient on Monday. Transitioned him into his own stocking. There were problems almost immediately with uncontrolled swelling weeping edema multiple some of which have opened. He does not feel systemically unwell in particular no chest pain no shortness of breath and he does not feel 04/08/16; the edema is under better control with the Profore light wrap but he  still has pitting edema. There is one large wound anteriorly 2 on the medial aspect of his left leg and 3 small areas on the superior posterior calf. Drainage is not excessive he is tolerating a Profore light well 04/15/16; put a Profore wrap on him last week. This is controlled is edema however he had a lot of pain on his left anterior foot most of his wounds are  healed 04/22/16 once again the patient has denuded areas on the left anterior foot which he states are because his wrap slips up word. He saw his primary physician today is on Lasix 40 twice a day and states that he his weight is down 20 pounds over the last 3 months. 04/29/16: Much improved. left anterior foot much improved. He is now on Lasix 80 mg per day. Much improved edema control 05/06/16; I was hoping to be able to discharge him today however once again he has blisters at a low level of where the compression was placed last week mostly on his left lateral but also his left medial leg and a small area on the anterior part of the left foot. 05/09/16; apparently the patient went home after his appointment on 7/4 later in the evening developing pain in his upper medial thigh together with subjective fever and chills although his temperature was not taken. The pain was so intense he felt he would probably have to call 911. However he then remembered that he had leftover doxycycline from a previous round of antibiotics and took these. By the next morning he felt a lot better. He called and spoke to one of our nurses and I approved doxycycline over the phone thinking that this was in relation to the wounds we had previously seen although they were definitely were not. The patient feels a lot better old fever no chills he is still working. Blood sugars are reasonably controlled 05/13/16; patient is back in for review of his cellulitis on his anterior medial upper thigh. He is taking doxycycline this is a lot better. Culture I did of the nodular area  on the dorsal aspect of his foot grew MRSA this also looks a lot better. 05/20/16; the patient is cellulitis on the medial upper thigh has resolved. All of his wound areas including the left anterior foot, areas on the medial aspect of the left calf and the lateral aspect of the calf at all resolved. He has a new blister on the left dorsal foot at the level of the fourth toe this was excised. No evidence of infection 05/27/16; patient continues to complain weeping edema. He has new blisterlike wounds on the left anterior lateral and posterior lateral calf at the top of his wrap levels. The area on his left anterior foot appears better. He is not complaining of fever, pain or pruritus in his feet. 05/30/16; the patient's blisters on his left anterior leg posterior calf all look improved. He did not increase the Lasix 100 mg as I suggested because he was going to run out of his 40 mg tablets. He is still having weeping edema of his toes 06/03/16; I renewed his Lasix at 80 mg once a day as he was about to run out when I last saw him. He is on 80 mg of Lasix now. I have asked him to cut down on the excessive amount of water he was drinking and asked him to drink according to his thirst mechanisms 06/12/2016 -- was seen 2 days ago and was supposed to wear his compression stockings at home but he is developed lymphedema and superficial blisters on the left lower extremity and hence came in for a review 06/24/16; the remaining wound is on his left anterior leg. He still has edema coming from between his toes. There is lymphedema here however his edema is generally better than when I last saw this. He has a history of atrial fibrillation but does  not have a known history of congestive heart failure nevertheless I think he probably has this at least on a diastolic basis. 07/01/16 I reviewed his echocardiogram from January 2017. This was essentially normal. He did not have LVH, EF of 55-60%. His right ventricular  function was normal although he did have trivial tricuspid and pulmonic regurgitation. This is not audible on exam however. I increased his Lasix to do massive edema in his legs well above his knees I think in early July. He was also drinking an excessive amount of water at the time. 07/15/16; missed his appointment last week because of the Labor Day holiday on Monday. He could not get another appointment later in the week. Started to feel the wrap digging in superiorly so we remove the top half and the bottom half of his wrap. He has extensive erythema and blistering superiorly in the left leg. Very tender. Very swollen. Edema in his foot with leaking edema fluid. He has not been systemically unwell 07/22/16; the area on the left leg laterally required some debridement. The medial wounds look more stable. His wrap injury wounds appear to have healed. Edema and his foot is better, weeping edema is also better. He tells me he is meeting with the supplier of the external compression pumps at work 08/05/16; the patient was on vacation last week in [facility]. His wrap is been on for an extended period of time. Also over the weekend he developed an extensive area of tender erythema across his anterior medial thigh. He took to doxycycline yesterday that he had leftover from a previous prescription. The patient complains of weeping edema coming out of his toes 08/08/16; I saw this patient on 10/2. He was tender across his anterior thigh. I put him on doxycycline. He returns today in follow-up. He does not have any open wounds on his lower leg, he still has edema weeping into his toes. 08/12/16; patient was seen back urgently today to follow-up for his extensive left thigh cellulitis/erysipelas. He comes back with a lot less swelling and erythema pain is much better. I believe I gave him Augmentin and Cipro. His wrap was cut down as he stated a roll down his legs. He developed blistering above the level of the  wrap that remained. He has 2 open blisters and 1 intact. 08/19/16; patient is been doing his primary doctor who is increased his Lasix from 40-80 once a day or 80 already has less edema. Cellulitis has remained improved in the left thigh. 2 open areas on the posterior left calf 08/26/16; he returns today having new open blisters on the anterior part of his left leg. He has his compression pumps but is not yet been shown how to use some vital representative from the supplier. 09/02/16 patient returns today with no open wounds on the left leg. Some maceration in his plantar toes 09/10/2016 -- Dr. Leanord Hawking had recently discharged him on 09/02/2016 and he has come right back with redness swelling and some open ulcers on his left lower extremity. He says this was caused by trying to apply his compression stockings and he's been unable to use this and has not been able to use his lymphedema pumps. He had some doxycycline leftover and he has started on this a few days ago. 09/16/16; there are no open wounds on his leg on the left and no evidence of cellulitis. 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 He does  not complain of symptoms here. I am not clear use using his external compression pumps. 09/23/16; I have not seen this patient in 2 weeks. He canceled his appointment 10 days ago as he was going on vacation. He tells me that on Monday he noticed a large area on his posterior left leg which is been draining copiously and is reopened into a large wound. He is been using ABDs and the external part of his juxtalite, according to our nurse this was not on properly. 10/07/16; Still a substantial area on the posterior left leg. Using silver alginate 10/14/16; in general better although there is still open area which looks healthy. Still using silver alginate. He reminds me that this happen before he left for [facility]. T oday while he was showering in  the morning. He had been using his juxtalite's 10/21/16; the area on his posterior left leg is fully epithelialized. However he arrives today with a large area of tender erythema in his medial and posterior left thigh just above the knee. I have marked the area. Once again he is reluctant to consider hospitalization. I treated him with oral antibiotics in the past for a similar situation with resolution I think with doxycycline however this area it seems more extensive to me. He is not complaining of fever but does have chills and says states he is thirsty. His blood sugar today was in the 140s at home 10/25/16 the area on his posterior left leg is fully epithelialized although there is still some weeping edema. The large area of tenderness and erythema in his medial and posterior left thigh is a lot less tender although there is still a lot of swelling in this thigh. He states he feels a lot better. He is on doxycycline and Augmentin that I started last week. This will continued until Tuesday, December 26. I have ordered a duplex ultrasound of the left thigh rule out DVT whether there is an abscess something that would need to be drained I would also like to know. 11/01/16; he still has weeping edema from a not fully epithelialized area on his left posterior calf. Most of the rest of this looks a lot better. He has completed his antibiotics. His thigh is a lot better. Duplex ultrasound did not show a DVT in the thigh 11/08/16; he comes in today with more Denuded surface epithelium from the posterior aspect of his calf. There is no real evidence of cellulitis. The superior aspect of his wrap appears to have put quite an indentation in his leg just below the knee and this may have contributed. He does not complain of pain or fever. We have been using silver alginate as the primary dressing. The area of cellulitis in the right thigh has totally resolved. He has been using his compression stockings once a  week 11/15/16; the patient arrives today with more loss of epithelium from the posterior aspect of his left calf. He now has a fairly substantial wound in this area. The reason behind this deterioration isn't exactly clear although his edema is not well controlled. He states he feels he is generally more swollen systemically. He is not complaining of chest pain shortness of breath fever. T me he has an appointment with his primary physician in early February. He is on 80 mg of oral ells Lasix a day. He claims compliance with the external compression pumps. He is not having any pain in his legs similar to what he has with his recurrent cellulitis  11/22/16; the patient arrives a follow-up of his large area on his left lateral calf. This looks somewhat better today. He came in earlier in the week for a dressing change since I saw him a week ago. He is not complaining of any pain no shortness of breath no chest pain 11/28/16; the patient arrives for follow-up of his large area on the left lateral calf this does not look better. In fact it is larger weeping edema. The surface of the wound does not look too bad. We have been using silver alginate although I'm not certain that this is a dressing issue. 12/05/16; again the patient follows up for a large wound on the left lateral and left posterior calf this does not look better. There continues to be weeping edema necrotic surface tissue. More worrisome than this once again there is erythema below the wound involving the distal Achilles and heel suggestive of cellulitis. He is on his feet working most of the day of this is not going well. We are changing his dressing twice a week to facilitate the drainage. 12/12/16; not much change in the overall dimensions of the large area on the left posterior calf. This is very inflamed looking. I gave him an. Doxycycline last week does not really seem to have helped. He found the wrap very painful indeed it seems to of dog into  his legs superiorly and perhaps around the heel. He came in early today because the drainage had soaked through his dressings. 12/19/16- patient arrives for follow-up evaluation of his left lower extremity ulcers. He states that he is using his lymphedema pumps once daily when there is "no drainage". He admits to not using his lipedema pumps while under current treatment. His blood sugars have been consistently between 150-200. 12/26/16; the patient is not using his compression pumps at home because of the wetness on his feet. I've advised him that I think it's important for him to use this daily. He finds his feet too wet, he can put a plastic bag over his legs while he is in the pumps. Otherwise I think will be in a vicious circle. We are using silver alginate to the major area on his left posterior calf 01/02/17; the patient's posterior left leg has further of all into 3 open wounds. All of them covered with a necrotic surface. He claims to be using his compression pumps once a day. His edema control is marginal. Continue with silver alginate 01/10/17; the patient's left posterior leg actually looks somewhat better. There is less edema, less erythema. Still has 3 open areas covered with a necrotic surface requiring debridement. He claims to be using his compression pumps once a day his edema control is better 01/17/17; the patient's left posterior calf look better last week when I saw him and his wrap was changed 2 days ago. He has noted increasing pain in the left heel and arrives today with much larger wounds extensive erythema extending down into the entire heel area especially tender medially. He is not systemically unwell CBGs have been controlled no fever. Our intake nurse showed me limegreen drainage on his AVD pads. 01/24/17; his usual this patient responds nicely to antibiotics last week giving him Levaquin for presumed Pseudomonas. The whole entire posterior part of his leg is much better much  less inflamed and in the case of his Achilles heel area much less tender. He has also had some epithelialization posteriorly there are still open areas here and still draining but  overall considerably better 01/31/17- He has continue to tolerate the compression wraps. he states that he continues to use the lymphedema pumps daily, and can increase to twice daily on the weekends. He is voicing no complaints or concerns regarding his LLE ulcers 02/07/17-he is here for follow-up evaluation. He states that he noted some erythema to the left medial and anterior thigh, which he states is new as of yesterday. He is concerned about recurrent cellulitis. He states his blood sugars have been slightly elevated, this morning in the 180s 02/14/17; he is here for follow-up evaluation. When he was last here there was erythema superiorly from his posterior wound in his anterior thigh. He was prescribed Levaquin however a culture of the wound surface grew MRSA over the phone I changed him to doxycycline on Monday and things seem to be a lot better. 02/24/17; patient missed his appointment on Friday therefore we changed his nurse visit into a physician visit today. Still using silver alginate on the large area of the posterior left thigh. He isn't new area on the dorsal left second toe 03/03/17; actually better today although he admits he has not used his external compression pumps in the last 2 days or so because of work responsibilities over the weekend. 03/10/17; continued improvement. External compression pumps once a day almost all of his wounds have closed on the posterior left calf. Better edema control 03/17/17; in general improved. He still has 3 small open areas on the lateral aspect of his left leg however most of the area on the posterior part of his leg is epithelialized. He has better edema control. He has an ABD pad under his stocking on the right anterior lower leg although he did not let us look at that  today. 03/24/17; patient arrives back in clinic today with no open areas however there are areas on the posterior left calf and anterior left calf that are less than 100% epithelialized. His edema is well controlled in the left lower leg. There is some pitting edema probably lymphedema in the left upper thigh. He uses compression pumps at home once per day. I tried to get him to do this twice a day although he is very reticent. 04/01/2017 -- for the last 2 days he's had significant redness, tenderness and weeping and came in for an urgent visit today. 04/07/17; patient still has 6 more days of doxycycline. He was seen by Dr. Meyer Russel last Wednesday for cellulitis involving the posterior aspect, lateral aspect of his Involving his heel. For the most part he is better there is less erythema and less weeping. He has been on his feet for 12 hours o2 over the weekend. Using his compression pumps once a day 04/14/17 arrives today with continued improvement. Only one area on the posterior left calf that is not fully epithelialized. He has intense bilateral venous inflammation associated with his chronic venous insufficiency disease and secondary lymphedema. We have been using silver alginate to the left posterior calf wound In passing he tells [place] today that the right leg but we have not seen in quite some time has an open area on it but he doesn't want [place] to look at this today states he will show this to [place] next week. 04/21/17; there is no open area on his left leg although he still reports some weeping edema. He showed [place] his right leg today which is the first time we've seen this leg in a long time. He has a large area of  open wound on the right leg anteriorly healthy granulation. Quite a bit of swelling in the right leg and some degree of venous inflammation. He told us about the right leg in passing last week but states that deterioration in the right leg really only happened over the weekend 04/28/17; there  is no open area on the left leg although there is an irritated part on the posterior which is like a wrap injury. The wound on the right leg which was new from last week at least to [place] is a lot better. 05/05/17; still no open area on the left leg. Patient is using his new compression stocking which seems to be doing a good job of controlling the edema. He states he is using his compression pumps once per day. The right leg still has an open wound although it is better in terms of surface area. Required debridement. A lot of pain in the posterior right Achilles marked tenderness. Usually this type of presentation this patient gives concern for an active cellulitis 05/12/17; patient arrives today with his major wound from last week on the right lateral leg somewhat better. Still requiring debridement. He was using his compression stocking on the left leg however that is reopened with superficial wounds anteriorly he did not have an open wound on this leg previously. He is still using his juxta light's once daily at night. He cannot find the time to do this in the morning as he has to be at work by 7 AM 05/19/17; right lateral leg wound looks improved. No debridement required. The concerning area is on the left posterior leg which appears to almost have a subcutaneous hemorrhagic component to it. We've been using silver alginate to all the wounds 05/26/17; the right lateral leg wound continues to look improved. However the area on the left posterior calf is a tightly adherent surface. [place] using silver alginate. Because of the weeping edema in his legs there is very little good alternatives. 06/02/17; the patient left here last week looking quite good. Major wound on the left posterior calf and a small one on the right lateral calf. Both of these look satisfactory. He tells me that by Wednesday he had noted increased pain in the left leg and drainage. He called on Thursday and Friday to get an  appointment here but we were blocked. He did not go to urgent care or his primary physician. He thinks he had a fever on Thursday but did not actually take his temperature. He has not been using his compression pumps on the left leg because of pain. I advised him to go to the emergency room today for IV antibiotics for stents of left leg cellulitis but he has refused I have asked him to take 2 days off work to keep his leg elevated and he has refused this as well. In view of this I'm going to call him and Augmentin and doxycycline. 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 However after a detailed discussion regarding the risk benefits and alternatives he refuses to get admitted to the hospital. With no other recourse I will continue him on oral antibiotics as before and hopefully he'll have his infectious disease consultation this week. 06/16/2017 -- the patient was seen  today by the nurse practitioner at infectious disease Ms. Dixon. 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 He has also been advise Lotrimin cream locally. she also had wise IV antibiotics for his condition if it flares up 06/23/17; patient arrives today with drainage bilaterally although the remaining wound on the left posterior calf after cleaning up today "highlighter yellow drainage" did not look too bad. Unfortunately he has had breakdown on the right anterior leg [previously this leg had not been open and he is using a black stocking] he went to see infectious disease and is been put on clindamycin 150 mg  daily, I did not verify the dose although I'm not familiar with using clindamycin in this dosing range, perhaps for prophylaxisoo 06/27/17; I brought this patient back today to follow-up on the wound deterioration on the right lower leg together with surrounding cellulitis. I started him on doxycycline 4 days ago. This area looks better however he comes in today with intense cellulitis on the medial part of his left thigh. This is not have a wound in this area. Extremely tender. We've been using silver alginate to the wounds on the right lower leg left lower leg with bilateral 4 layer compression he is using his external compression pumps once a day 07/04/17; patient's left medial thigh cellulitis looks better. He has not been using his compression pumps as his insert said it was contraindicated with cellulitis. His right leg continues to make improvements all the wounds are still open. We only have one remaining wound on the left posterior calf. Using silver alginate to all open areas. He is on doxycycline which I started a week ago and should be finishing I gave him Augmentin after Thursday's visit for the severe cellulitis on the left medial thigh which fortunately looks better 07/14/17; the patient's left medial thigh cellulitis has resolved. The cellulitis in his right lower calf on the right also looks better. All of his wounds are stable to improved we've been using silver alginate he has completed the antibiotics I have given him. He has clindamycin 150 mg once a day prescribed by infectious disease for prophylaxis, I've advised him to start this now. We have been using bilateral Unna boots over silver alginate to the wound areas 07/21/17; the patient is been to see infectious disease who noted his recurrent problems with cellulitis. He was not able to tolerate prophylactic clindamycin therefore he is on amoxicillin 500 twice a day. He also had a second daily dose of Lasix added By Dr. Oneta Rack but  he is not taking this. Nor is he being completely compliant with his compression pumps a especially not this week. He has 2 remaining wounds one on the right posterior lateral lower leg and one on the left posterior medial lower leg. 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 The patient has Unna boots bilaterally. 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 He has bilateral Unna boots for compression and silver alginate to his wounds. Arrives today with his legs looking as good as I have seen him in quite some time. Not surprisingly his  wounds look better as well with improvement on the right lateral leg venous insufficiency wound and also the left medial leg. He is still using the compression pumps once a day 08/11/17; both legs appear to be doing better wounds on the right lateral and left medial legs look better. Skin on the right leg quite good. He is been using silver alginate as the primary dressing. I'm going to use Anasept gel calcium alginate and maintain all the secondary dressings 08/18/17; the patient continues to actually do quite well. The area on his right lateral leg is just about closed the left medial also looks better although it is still moist in this area. His edema is well controlled we have been using Anasept gel with calcium alginate and the usual secondary dressings, 4 layer compression and once daily use of his compression pumps "always been able to manage 09/01/17; the patient continues to do reasonably well in spite of his trip to [place]. The area on the right lateral leg is epithelialized. Left is much better but still open. He has more edema and more chronic erythema on the left leg [venous inflammation] 09/08/17; he arrives today with no open  wound on the right lateral leg and decently controlled edema. Unfortunately his left leg is not nearly as in his good situation as last week.he apparently had increasing edema starting on Saturday. He edema soaked through into his foot so used a plastic bag to walk around his home. The area on the medial right leg which was his open area is about the same however he has lost surface epithelium on the left lateral which is new and he has significant pain in the Achilles area of the left foot. He is already on amoxicillin chronically for prophylaxis of cellulitis in the left leg 09/15/17; he is completed a week of doxycycline and the cellulitis in the left posterior leg and Achilles area is as usual improved. He still has a lot of edema and fluid soaking through his dressings. There is no open wound on the right leg. He saw infectious disease NP today 09/22/17;As usual 1 we transition him from our compression wraps to his stockings things did not go well. He has several small open areas on the right leg. He states this was caused by the compression wrap on his skin although he did not wear this with the stockings over them. He has several superficial areas on the left leg medially laterally posteriorly. He does not have any evidence of active cellulitis especially involving the left Achilles [facility] [place] [facility] [place] He states he isn't attempting to get an appointment with a heel objects wound center there to change his dressings. I am not completely certain whether this will work 10/06/17; the patient came in on Friday for a nurse visit and the nurse reported that his legs actually look quite good. He arrives in clinic today for his regular follow-up visit. He has a new wound on his left third toe over the PIP probably caused by friction with his footwear. He has small areas on the left leg and a very superficial but epithelialized area on the right anterior  lateral lower leg. Other than that his legs look as good as I've seen him in quite some time. We have been using silver alginate Review of systems; no chest pain no shortness of breath other than this a 10 point review of systems negative 10/20/17; seen by Dr. Meyer RusselBritto last  week. He had taken some antibiotics [doxycycline] that he had left over. Dr. Meyer Russel thought he had candida infection and declined to give him further antibiotics. He has a small wound remaining on the right lateral leg several areas on the left leg including a larger area on the left posterior several left medial and anterior and a small wound on the left lateral. The area on the left dorsal third toe looks a lot better. ROS; Gen.; no fever, respiratory no cough no sputum Cardiac no chest pain other than this 10 point review of system is negative 10/30/17; patient arrives today having fallen in the bathtub 3 days ago. It took him a while to get up. He has pain and maceration in the wounds on his left leg which have deteriorated. He has not been using his pumps he also has some maceration on the right lateral leg. 11/03/17; patient continues to have weeping edema especially in the left leg. This saturates his dressings which were just put on on 12/27. As usual the doxycycline seems to take care of the cellulitis on his lower leg. He is not complaining of fever, chills, or other systemic symptoms. He states his leg feels a lot better on the doxycycline I gave him empirically. He also apparently gets injections at his primary doctor's officeo Rocephin for cellulitis prophylaxis. I didn't ask him about his compression pump compliance today I think that's probably marginal. Arrives in the clinic with all of his dressings primary and secondary macerated full of fluid and he has bilateral edema 11/10/17; the patient's right leg looks some better although there is still a cluster of wounds on the right lateral. The left leg is inflamed with  almost circumferential skin loss medially to laterally although we are still maintaining anteriorly. He does not have overt cellulitis there is a lot of drainage. He is not using compression pumps. We have been using silver alginate to the wound areas, there are not a lot of options here 11/17/17; the patient's right leg continues to be stable although there is still open wounds, better than last week. The inflammation in the left leg is better. Still loss of surface layer epithelium especially posteriorly. There is no overt cellulitis in the amount of edema and his left leg is really quite good, tells me he is using his compression pumps once a day. 11/24/17; patient's right leg has a small superficial wound laterally this continues to improve. The inflammation in the left leg is still improving however we have continuous surface layer epithelial loss posteriorly. There is no overt cellulitis in the amount of edema in both legs is really quite good. He states he is using his compression pumps on the left leg once a day for 5 out of 7 days 12/01/17; very small superficial areas on the right lateral leg continue to improve. Edema control in both legs is better today. He has continued loss of surface epithelialization and left posterior calf although I think this is better. We have been using silver alginate with large number of absorptive secondary dressings 4 layer on the left Unna boot on the right at his request. He tells me he is using his compression pumps once a day 12/08/17; he has no open area on the right leg is edema control is good here. ooOn the left leg however he has marked erythema and tenderness breakdown of skin. He has what appears to be a wrap injury just distal to the popliteal fossa. This is the pattern  of his recurrent cellulitis area and he apparently received penicillin at his primary physician's office really worked in my view but usually response to doxycycline given it to him  several times in the past 12/15/17; the patient had already deteriorated last Friday when he came in for his nurse check. There was swelling erythema and breakdown in the right leg. He has much worse skin breakdown in the left leg as well multiple open areas medially and posteriorly as well as laterally. He tells me he has been using his compression pumps but tells me he feels that the drainage out of his leg is worse when he uses a compression pumps. T be fair to him he is been saying this o for a while however I don't know that I have really been listening to this. I wonder if the compression pumps are working properly 12/22/17;. Once again he arrives with severe erythema, weeping edema from the left greater than right leg. Noncompliance with compression pumps. New this visit he is complaining of pain on the lateral aspect of the right leg and the medial aspect of his right thigh. He apparently saw his cardiologist Dr. [name] who was ordered an echocardiogram area and I think this is a step in the right direction 12/25/17; started his doxycycline Monday night. There is still intense erythema of the right leg especially in the anterior thigh although there is less tenderness. The erythema around the wound on the right lateral calf also is less tender. He still complaining of pain in the left heel. His wounds are about the same right lateral left medial left lateral. Superficial but certainly not close to closure. He denies being systemically unwell no fever chills no abdominal pain no diarrhea 12/29/17; back in follow-up of his extensive right calf and right thigh cellulitis. I added amoxicillin to cover possible doxycycline resistant strep. This seems to of done the trick he is in much less pain there is much less erythema and swelling. He has his echocardiogram at 11:00 this morning. X-ray of the left heel was also negative. 01/05/18; the patient arrived with his edema under much better control. Now that  he is retired he is able to use his compression pumps daily and sometimes twice a day per the patient. He has a wound on the right leg the lateral wound looks better. Area on the left leg also looks a lot better. He has no evidence of cellulitis in his bilateral thighs I had a quick peak at his echocardiogram. He is in normal ejection fraction and normal left ventricular function. He has moderate pulmonary hypertension moderately reduced right ventricular function. One would have to wonder about chronic sleep apnea although he says he doesn't snore. He'll review the echocardiogram with his cardiologist. 01/12/18; the patient arrives with the edema in both legs under exemplary control. He is using his compression pumps daily and sometimes twice daily. His wound on the right lateral leg is just about closed. He still has some weeping areas on the posterior left calf and lateral left calf although everything is just about closed here as well. I have spoken with Aldean Baker who is the patient's nurse practitioner and infectious disease. She was concerned that the patient had not understood that the parenteral penicillin injections he was receiving for cellulitis prophylaxis was actually benefiting him. I don't think the patient actually saw that I would tend to agree we were certainly dealing with less infections although he had a serious one last month. 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 He is healed. He'll be placed in juxtalite compression wraps and increase his lymphedema pumps to twice daily. We will follow up again next week to ensure there are no issues with the new regiment. 01/20/18-he is here for evaluation of bilateral lower extremity weeping edema. Yesterday he was placed in compression wrap to the right lower extremity and compression stocking to left lower shrubbery. He states he uses lymphedema pumps last night and again this morning and noted a  blister to the left lower extremity. On exam he was noted to have drainage to the right lower extremity. He will be placed in Unna boots bilaterally and follow-up next week 01/26/18; patient was actually discharged a week ago to his own juxta light stockings only to return the next day with bilateral lower extremity weeping edema.he was placed in bilateral Unna boots. He arrives today with pain in the back of his left leg. There is no open area on the right leg however there is a linear/wrap injury on the left leg and weeping edema on the left leg posteriorly. I spoke with infectious disease about 10 days ago. They were disappointed that the patient elected to discontinue prophylactic intramuscular penicillin shots as they felt it was particularly beneficial in reducing the frequency of his cellulitis. I discussed this with the patient today. He does not share this view. He'll definitely need antibiotics today. Finally he is traveling to [place] and trauma leaving this Saturday and returning a week later and he does not travel with his pumps. He is going by car 01/30/18; patient was seen 4 days ago and brought back in today for review of cellulitis in the left leg posteriorly. I put him on amoxicillin this really hasn't helped as much as I might like. He is also worried because he is traveling to [facility] trauma by car. Finally we will be rewrapping him. There is no open area on the right leg over his left leg has multiple weeping areas as usual 02/09/18; The same wrap on for 10 days. He did not pick up the last doxycycline I prescribed for him. He apparently took 4 days worth he already had. There is nothing open on his right leg and the edema control is really quite good. He's had damage in the left leg medially and laterally especially probably related to the prolonged use of Unna boots 02/12/18; the patient arrived in clinic today for a nurse visit/wrap change. He complained of a lot of pain in the  left posterior calf. He is taking doxycycline that I previously prescribed for him. Unfortunately even though he used his stockings and apparently used to compression pumps twice a day he has weeping edema coming out of the lateral part of his right leg. This is coming from the lower anterior lateral skin area. 02/16/18; the patient has finished his doxycycline and will finish the amoxicillin 2 days. The area of cellulitis in the left calf posteriorly has resolved. He is no longer having any pain. He tells me he is using his compression pumps at least once a day sometimes twice. 02/23/18; the patient finished his doxycycline and Amoxil last week. On Friday he noticed a small erythematous circle about the size of a quarter on the left lower leg just above his ankle. This rapidly expanded and he now has erythema on the lateral and posterior part of the thigh. This is bright red. Also has an area on the dorsal  foot just above his toes and a tender area just below the left popliteal fossa. He came off his prophylactic penicillin injections at his own insistence one or 2 months ago. This is obviously deteriorated since then 03/02/18; patient is on doxycycline and Amoxil. Culture I did last week of the weeping area on the back of his left calf grew group B strep. I have therefore renewed the amoxicillin 500 3 times a day for a further week. He has not been systemically unwell. Still complaining of an area of discomfort right under his left popliteal fossa. There is no open wound on the right leg. He tells me that he is using his pumps twice a day on most days 03/09/18; patient arrives in clinic today completing his amoxicillin today. The cellulitis on his left leg is better. Furthermore he tells me that he had intramuscular penicillin shots that his primary care office today. However he also states that the wrap on his right leg fell down shortly after leaving clinic last week. He developed a large blister that  was present when he came in for a nurse visit later in the week and then he developed intense discomfort around this area.He tells me he is using his compression pumps 03/16/18; the patient has completed his doxycycline. The infectious part of this/cellulitis in the left heel area left popliteal area is a lot better. He has 2 open areas on the right calf. Still areas on the left calf but this is a lot better as well. 03/24/18; the patient arrives complaining of pain in the left popliteal area again. He thinks some of this is wrap injury. He has no open area on the right leg and really no open area on the left calf either except for the popliteal area. He claims to be compliant with the compression pumps 03/31/18; I gave him doxycycline last week because of cellulitis in the left popliteal area. This is a lot better although the surface epithelium is denuded off and response to this. He arrives today with uncontrolled edema in the right calf area as well as a fingernail injury in the right lateral calf. There is only a few open areas on the left 04/06/18; I gave him amoxicillin doxycycline over the last 2 weeks that the amoxicillin should be completing currently. He is not complaining of any pain or systemic symptoms. The only open areas see has is on the right lateral lower leg paradoxically I cannot see anything on the left lower leg. He tells me he is using his compression pumps twice a day on most days. Silver alginate to the wounds that are open under 4 layer compression 04/13/18; he completed antibiotics and has no new complaints. Using his compression pumps. Silver alginate that anything that's opened 04/20/18; he is using his compression pumps religiously. Silver alginate 4 layer compression anything that's opened. He comes in today with no open wounds on the left leg but 3 on the right including a new one posteriorly. He has 2 on the right lateral and one on the right posterior. He likes Unna boots on  the right leg for reasons that aren't really clear we had the usual 4 layer compression on the left. It may be necessary to move to the 4 layer compression on the right however for now I left them in the Unna boots 04/27/18; he is using his compression pumps at least once a day. He has still the wounds on the right lateral calf. The area right posteriorly  has closed. He does not have an open wound on the left under 4 layer compression however on the dorsal left foot just proximal to the toes and the left third toe 2 small open areas were identified 05/11/18; he has not uses compression pumps. The areas on the right lateral calf have coalesced into one large wound necrotic surface. On the left side he has one small wound anteriorly however the edema is now weeping out of a large part of his left leg. He says he wasn't using his pumps because of the weeping fluid. I explained to him that this is the time he needs to pump more 05/18/18; patient states he is using his compression pumps twice a day. The area on the right lateral large wound albeit superficial. On the left side he has innumerable number of small new wounds on the left calf particularly laterally but several anteriorly and medially. All these appear to have healthy granulated base these look like the remnants of blisters however they occurred under compression. The patient arrives in clinic today with his legs somewhat better. There is certainly less edema, less multiple open areas on the left calf and the right anterior leg looks somewhat better as well superficial and a little smaller. However he relates pain and erythema over the last 3-4 days in the thigh and I looked at this today. He has not been systemically unwell no fever no chills no change in blood sugar values 05/25/18; comes in today in a better state. The severe cellulitis on his left leg seems better with the Keflex. Not as tender. He has not been systemically unwell ooHard to  find an open wound on the left lower leg using his compression pumps twice a day ooThe confluent wounds on his right lateral calf somewhat better looking. These will ultimately need debridement I didn't do this today. 06/01/18; the severe cellulitis on the left anterior thigh has resolved and he is completed his Keflex. ooThere is no open wound on the left leg however there is a superficial excoriation at the base of the third toe dorsally. Skin on the bottom of his left foot is macerated looking. ooThe left the wounds on the lateral right leg actually looks some better although he did require debridement of the top half of this wound area with an open curet 06/09/18 on evaluation today patient appears to be doing poorly in regard to his right lower extremity in particular this appears to likely be infected he has very thick purulent discharge along with a bright green tent to the discharge. This makes me concerned about the possibility of pseudomonas. He's also having increased discomfort at this point on evaluation. Fortunately there does not appear to be any evidence of infection spreading to the other location at this time. 06/16/18 on evaluation today patient appears to actually be doing fairly well. His ulcer has actually diminished in size quite significantly at this point which is good news. Nonetheless he still does have some evidence of infection he did see infectious disease this morning before coming here for his appointment. I did review the results of their evaluation and their note today. They did actually have him discontinue the Cipro and initiate treatment with linezolid at this time. He is doing this for the next seven days and they recommended a follow-up in four months with them. He is the keep a log of the need for intermittent antibiotic therapy between now and when he falls back up with infectious disease.  This will help them gaze what exactly they need to do to try and help them  out. 06/23/18; the patient arrives today with no open wounds on the left leg and left third toe healed. He is been using his compression pumps twice a day. On the right lateral leg he still has a sizable wound but this is a lot better than last time I saw this. 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 Has been using silver alginate under 4 layer compression 06/30/18; the only open wound he has is on the right lateral leg and this looks healthy. No debridement is required. We have been using silver alginate. He does not have an open wound on the left leg. There is apparently some drainage from the dorsal proximal third toe on the left although I see no open wound here. 07/03/18 on evaluation today patient was actually here just for a nurse visit rapid change. However when he was here on Wednesday for his rat change due to having been healed on the left and then developing blisters we initiated the wrap again knowing that he would be back today for [place] to reevaluate and see were at. Unfortunately he has developed some cellulitis into the proximal portion of his right lower extremity even into the region of his thigh. 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 He was placed on one as what at that point. Nonetheless he is done with that and has been tolerating it well otherwise. Doxycycline which in the past really did not seem to be effective for him. Nonetheless I think the best option may be for [place] to definitely reinitiate the antibiotics for a longer period of time. 07/07/18; since I last saw this patient a week ago he has had a difficult time. At that point he did not have an open wound on his left leg. We transitioned him into juxta light stockings. He was apparently in the clinic the next day with blisters on the left lateral and left medial lower calf. He  also had weeping edema fluid. He was put back into a compression wrap. He was also in the clinic on Friday with intense erythema in his right thigh. Per the patient he was started on Bactrim however that didn't work at all in terms of relieving his pain and swelling. He has taken 3 doxycycline that he had left over from last time and that seems to of helped. He has blistering on the right thigh as well. 07/14/18; the erythema on his right thigh has gotten better with doxycycline that he is finishing. 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 Presumably this cellulitis in the thigh was not related to that although I think this is a bit concerning going forward. He still has an area on the right lateral calf the blister on the right medial calf just below the knee that was discussed above. On the left 2 small open areas left medial and left lateral. Edema control is adequate. He is using his compression pumps twice a day 07/20/18; continued improvement in the condition of both legs especially the edema in his bilateral thighs. He tells me he is been losing weight through a combination of diet and exercise. He is using his compression pumps twice  a day. So overall she made to the remaining wounds 07/27/2018; continued improvement in condition of both legs. His edema is well controlled. The area on the right lateral leg is just about closed he had one blisters show up on the medial left upper calf. We have him in 4 layer compression. He is going on a 10-day trip to [place]. He will be driving. He wants to wear Unna boots because of the lessening amount of constriction. He will not use compression pumps while he is away 08/05/18 on evaluation today patient actually appears to be doing decently well all things considered in regard to his bilateral lower extremities. The worst ulcer is actually only posterior  aspect of his left lower extremity with a four layer compression wrap cut into his leg a couple weeks back. He did have a trip and actually had [employer] for the trip that he is worn since he was last here. Nonetheless he feels like the [employer] actually do better for him his swelling is up a little bit but he also with his trip was not taking his Lasix on a regular set schedule like he was supposed to be. He states that obviously the reason being that he cannot drive and keep going without having to urinate too frequently which makes it difficult. He did not have his pumps with him while he was away either which I think also maybe playing a role here too. 08/13/2018; the patient only has a small open wound on the right lateral calf which is a big improvement in the last month or 2. He also has the area posteriorly just below the posterior fossa on the left which I think was a wrap injury from several weeks ago. He has no current evidence of cellulitis. He tells me he is back into his compression pumps twice a day. He also tells me that while he was at the laundromat somebody stole a section of his extremitease stockings 08/20/2018; back in the clinic with a much improved state. He only has small areas on the right lateral mid calf which is just about healed. This was is more substantial area for quite a prolonged period of time. He has a small open area on the left anterior tibia. The area on the posterior calf just below the popliteal fossa is closed today. He is using his compression pumps twice a day 08/28/2018; patient has no open wound on the right leg. He has a smattering of open areas on the calf with some weeping lymphedema. More problematically than that it looks as though his wraps of slipped down in his usual he has very angry upper area of edema just below the right medial knee and on the right lateral calf. He has no open area on his feet. The patient is traveling to [facility] next week. I will send him in an antibiotic. We will continue to wrap the right leg. We ordered extremitease stockings for him last week and I plan to transition the right leg to a stocking when he gets home which will be in 10 days time. As usual he is very reluctant to take his pumps with him when he travels 09/07/2018; patient returns from [facility]. He shows me a picture of his left leg in the mid part of his trip last week with intense fire engine erythema. The picture look bad enough I would have considered sending him to the hospital. Instead  he went to the wound care center in [facility] They did not prescribe him antibiotics but he did take some doxycycline he had leftover from a previous visit. I had given him trimethoprim sulfamethoxazole before he left this did not work according to the patient. This is resulted in some improvement fortunately. He comes back with a large wound on the left posterior calf. Smaller area on the left anterior tibia. Denuded blisters on the dorsal left foot over his toes. Does not have much in the way of wounds on the right leg although he does have a very tender area on the right posterior area just below the popliteal fossa also suggestive of infection. He promises me he is back on his pumps twice a day 09/15/2018; the intense cellulitis in his left lower calf is a lot better. The wound area on the posterior left calf is also so better. However he has reasonably extensive wounds on the dorsal aspect of his second and third toes and the proximal foot just at the base of the toes. There is nothing open on the right leg 09/22/2018; the patient has excellent edema control in his legs bilaterally. He is using his external compression pumps twice a day. He has no open area on the right leg and only the areas in the left foot dorsally second and third toe area on the left side. He does not have any signs of active cellulitis. 10/06/2018; the  patient has good edema control bilaterally. He has no open wound on the right leg. There is a blister in the posterior aspect of his left calf that we had to deal with today. He is using his compression pumps twice a day. There is no signs of active cellulitis. We have been using silver alginate to the wound areas. He still has vulnerable areas on the base of his left first second toes dorsally He has a his extremities stockings and we are going to transition him today into the stocking on the right leg. He is cautioned that he will need to continue to use the compression pumps twice a day. If he notices uncontrolled edema in the right leg he may need to go to 3 times a day. 10/13/2018; the patient came in for a nurse check on Friday he has a large flaccid blister on the right medial calf just below the knee. We unroofed this. He has this and a new area underneath the posterior mid calf which was undoubtedly a blister as well. He also has several small areas on the right which is the area we put his extremities stocking on. 10/19/2018; the patient went to see infectious disease this morning I am not sure if that was a routine follow-up in any case the doxycycline I had given him was discontinued and started on linezolid. He has not started this. It is easy to look at his left calf and the inflammation and think this is cellulitis however he is very tender in the tissue just below the popliteal fossa and I have no doubt that there is infection going on here. He states the problem he is having is that with the compression pumps the edema goes down and then starts walking the wrap falls down. We will see if we can adhere this. He has 1 or 2 minuscule open areas on the right still areas that are weeping on the posterior left calf, the base of his left second and third toes 10/26/18; back today in clinic with  quite of skin breakdown in his left anterior leg. This may have been infection the area below the  popliteal fossa seems a lot better however tremendous epithelial loss on the left anterior mid tibia area over quite inexpensive tissue. He has 2 blisters on the right side but no other open wound here. 10/29/2018; came in urgently to see [place] today and we worked him in for review. He states that the 4 layer compression on the right leg caused pain he had to cut it down to roughly his mid calf this caused swelling above the wrap and he has blisters and skin breakdown today. As a result of the pain he has not been using his pumps. Both legs are a lot more edematous and there is a lot of weeping fluid. 11/02/18; arrives in clinic with continued difficulties in the right leg> left. Leg is swollen and painful. multiple skin blisters and new open areas especially laterally. He has not been using his pumps on the right leg. 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 He is not systemically unwell. 11/09/2018; the patient claims he is being compliant with his pumps. He is finished the doxycycline I gave him last week. Culture I did of the wound on the right lateral leg showed a few very resistant methicillin staph aureus. This was resistant to doxycycline. Nevertheless he states the pain in the leg is a lot better which makes me wonder if the cultured organism was not really what was causing the problem nevertheless this is a very dangerous organism to be culturing out of any wound. His right leg is still a lot larger than the left. He is using an Radio broadcast assistant on this area, he blames a 4-layer compression for causing the original skin breakdown which I doubt is true however I cannot talk him out of it. We have been using silver alginate to all of these areas which were initially blisters 11/16/2018; patient is being compliant with his external compression pumps at twice a day. Miraculously he arrives in clinic today with absolutely no open wounds. He has better edema  control on the left where he has been using 4 layer compression versus wound of wounds on the right and I pointed this out to him. There is no inflammation in the skin in his lower legs which is also somewhat unusual for him. There is no open wounds on the dorsal left foot. He has extremitease stockings at home and I have asked him to bring these in next week. 11/25/18 patient's lower extremity on examination today on the left appears for the most part to be wound free. He does have an open wound on the lateral aspect of the right lower extremity but this is minimal compared to what I've seen in past. He does request that we go ahead and wrap the left leg as well even though there's nothing open just so hopefully it will not reopen in short order. 1/28; patient has superficial open wounds on the right lateral calf left anterior calf and left posterior calf. His edema control is adequate. 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 We have been using silver alginate as the primary dressing. He claims compliance with his compression pumps 2/4; patient has superficial open wounds on numerous areas of his left calf and again one on the left dorsal foot. The areas on the right  lateral calf have healed. The cellulitis that I gave him doxycycline for last week is also resolved this was mostly on the left anterior calf just below the tibial tuberosity. His edema looks fairly well-controlled. He tells me he went to see his primary doctor today and had blood work ordered 2/11; once again he has several open areas on the left calf left tibial area. Most of these are small and appear to have healthy granulation. He does not have anything open on the right. The edema and control in his thighs is pretty good which is usually a good indication he has been using his pumps as requested. 2/18; he continues to have several small areas on  the left calf and left tibial area. Most of these are small healthy granulation. We put him in his stocking on the right leg last week and he arrives with a superficial open area over the right upper tibia and a fairly large area on the right lateral tibia in similar condition. His edema control actually does not look too bad, he claims to be using his compression pumps twice a day 2/25. Continued small areas on the left calf and left tibial area. New areas especially on the right are identified just below the tibial tuberosity and on the right upper tibia itself. There are also areas of weeping edema fluid even without an obvious wound. He does not have a considerable degree of lymphedema but clearly there is more edema here than his skin can handle. He states he is using the pumps twice a day. We have an Unna boot on the right and 4 layer compression on the left. 3/3; he continues to have an area on the right lateral calf and right posterior calf just below the popliteal fossa. There is a fair amount of tenderness around the wound on the popliteal fossa but I did not see any evidence of cellulitis, could just be that the wrap came down and rubbed in this area. ooHe does not have an open area on the left leg however there is an area on the left dorsal foot at the base of the third toe ooWe have been using silver alginate to all wound areas 3/10; he did not have an open area on his left leg last time he was here a week ago. T oday he arrives with a horizontal wound just below the tibial tuberosity and an area on the left lateral calf. He has intense erythema and tenderness in this area. The area is on the right lateral calf and right posterior calf better than last week. We have been using silver alginate as usual 3/18 - Patient returns with 3 small open areas on left calf, and 1 small open area on right calf, the skin looks ok with no significant erythema, he continues the UNA boot on right and 4  layer compression on left. The right lateral calf wound is closed , the right posterior is small area. we will continue silver alginate to the areas. 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 He is been tolerating the dressing changes without complication. Fortunately she has made excellent improvement in regard to the overall status of his wounds. Unfortunately every time we cease wrapping him he ends up reopening in causing more significant issues at that point. Again I'm  unsure of the best direction to take although I think the lymphedema clinic may be appropriate for him. 02/03/19 on evaluation today patient appears to be doing well in regard to the wounds that we saw him for last week unfortunately he has a new area on the proximal portion of his right medial/posterior lower extremity where the wrap somewhat slowed down and caused swelling and a blister to rub and open. Unfortunately this is the only opening that he has on either leg at this point. 02/17/19 on evaluation today patient's bilateral lower extremities appear to be doing well. He still completely healed in regard to the left lower extremity. In regard to the right lower extremity the area where the wrap and slid down and caused the blister still seems to be slightly open although this is dramatically better than during the last evaluation two weeks ago. I'm very pleased with the way this stands overall. 03/03/19 on evaluation today patient appears to be doing well in regard to his right lower extremity in general although he did have a new blister open this does not appear to be showing any evidence of active infection at this time. Fortunately there's No fevers, chills, nausea, or vomiting noted at this time. Overall I feel like he is making  good progress it does feel like that the right leg will we perform the [employer] seems to do with a bit better than three layer wrap on the left which slid down on him. We may switch to doing bilateral in the book wraps. 5/4; I have not seen Mr. Buxton in quite some time. According to our case manager he did not have an open wound on his left leg last week. He had 1 remaining wound on the right posterior medial calf. He arrives today with multiple openings on the left leg probably were blisters and/or wrap injuries from Unna boots. I do not think the Unna boot's will provide adequate compression on the left. I am also not clear about the frequency he is using the compression pumps. 03/17/19 on evaluation today patient appears to be doing excellent in regard to his lower extremities compared to last week's evaluation apparently. He had gotten significantly worse last week which is unfortunate. The [employer] wrap on the left did not seem to do very well for him at all and in fact it didn't control his swelling significantly enough he had an additional outbreak. Subsequently we go back to the four layer compression wrap on the left. This is good news. At least in that he is doing better and the wound seem to be killing him. He still has not heard anything from the lymphedema clinic. 03/24/19 on evaluation today patient actually appears to be doing much better in regard to his bilateral lower Trinity as compared to last week when I saw him. Fortunately there's no signs of active infection at this time. He has been tolerating the dressing changes without complication. Overall I'm extremely pleased with the progress and appearance in general. 04/07/19 on evaluation today patient appears to be doing well in regard to his bilateral lower extremities. His swelling is significantly down from where it was previous. With that being said he does have a couple blisters still open at this point but fortunately nothing  that seems to be too severe and again the majority of the larger openings has healed at this time. 04/14/19 on evaluation today patient actually appears to be doing quite well in regard to his bilateral  lower extremities in fact I'm not even sure there's anything significantly open at this time at any site. Nonetheless he did have some trouble with these wraps where they are somewhat irritating him secondary to the fact that he has noted that the graph wasn't too close down to the end of this foot in a little bit short as well up to his knee. Otherwise things seem to be doing quite well. 04/21/19 upon evaluation today patient's wound bed actually showed evidence of being completely healed in regard to both lower extremities which is excellent news. There does not appear to be any signs of active infection which is also good news. I'm very pleased in this regard. No fevers, chills, nausea, or vomiting noted at this time. 04/28/19 on evaluation today patient appears to be doing a little bit worse in regard to both lower extremities on the left mainly due to the fact that when he went infection disease the wrap was not wrapped quite high enough he developed a blister above this. On the right he is a small open area of nothing too significant but again this is continuing to give him some trouble he has been were in the Velcro compression that he has at home. 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 He's been tolerating the dressing changes without complication. Fortunately there's no signs of active infection at this time. No fevers, chills, nausea, or vomiting noted at this time. We have been trying to get an appointment with her lymphedema clinic in [facility] but unfortunately nobody can get them on phone with not been able to even fax information over the patient likewise is not been able to get in touch with them. Overall  I'm not sure exactly what's going on here with to reach out again today. 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 Still having a lot of drainage unfortunately. He tells me especially in the left but even on the right. There's no signs of active infection which is good news we've been using so ratcheted up to this point. 05/19/19 on evaluation today patient actually appears to be doing quite well with regard to his left lower extremity which is great news. Fortunately in regard to the right lower extremity has an issues with his wrap and he subsequently did remove this from what I'm understanding. Nonetheless long story short is what he had rewrapped once he removed it subsequently had maggots underneath this wrap whenever he came in for evaluation today. With that being said they were obviously completely cleaned away by the nursing staff. The visit today which is excellent news. However he does appear to potentially have some infection around the right ankle region where the maggots were located as well. He will likely require anabiotic therapy today. 05/26/19 on evaluation today patient actually appears to be doing much better in regard to his bilateral lower extremities. I feel like the infection is under much better control. With that being said there were maggots noted when the wrap was removed yet again today. Again this could have potentially been left over from previous although at this time there does not appear to be any signs of significant drainage there was obviously on the wrap some drainage as well this contracted gnats or otherwise. Either way I do not see anything that appears to be doing worse in my  pinion and in fact I think his drainage has slowed down quite significantly likely mainly due to the fact to his infection being under better control. 06/02/2019 on evaluation today patient actually appears  to be doing well with regard to his bilateral lower extremities there is no signs of active infection at this time which is great news. With that being said he does have several open areas more so on the right than the left but nonetheless these are all significantly better than previously noted. 06/09/2019 on evaluation today patient actually appears to be doing well. His wrap stayed up and he did not cause any problems he had more drainage on the right compared to the left but overall I do not see any major issues at this time which is great news. 06/16/2019 on evaluation today patient appears to be doing excellent with regard to his lower extremities the only area that is open is a new blister that can have opened as of today on the medial ankle on the left. Other than this he really seems to be doing great I see no major issues at this point. 06/23/2019 on evaluation today patient appears to be doing quite well with regard to his bilateral lower extremities. In fact he actually appears to be almost completely healed there is a small area of weeping noted of the right lower extremity just above the ankle. Nonetheless fortunately there is no signs of active infection at this time which is good news. No fevers, chills, nausea, vomiting, or diarrhea. 8/24; the patient arrived for a nurse visit today but complained of very significant pain in the left leg and therefore I was asked to look at this. Noted that he did not have an open area on the left leg last week nevertheless this was wrapped. The patient states that he is not been able to put his compression pumps on the left leg because of the discomfort. He has not been systemically unwell 06/30/2019 on evaluation today patient unfortunately despite being excellent last week is doing much worse with regard to his left lower extremity today. In fact he had to come in for a nurse on Monday where his left leg had to be rewrapped due to excessive weeping Dr.  Leanord Hawking placed him on doxycycline at that point. Fortunately there is no signs of active infection Systemically at this time which is good news. 07/07/2019 in regard to the patient's wounds today he actually seems to be doing well with his right lower extremity there really is nothing open or draining at this point this is great news. Unfortunately the left lower extremity is given him additional trouble at this time. There does not appear to be any signs of active infection nonetheless he does have a lot of edema and swelling noted at this point as well as blistering all of which has led to a much more poor appearing leg at this time compared to where it was 2 weeks ago when it was almost completely healed. Obviously this is a little discouraging for the patient. He is try to contact the lymphedema clinic in [place] he has not been able to get through to them. 07/14/2019 on evaluation today patient actually appears to be doing slightly better with regard to his left lower extremity ulcers. Overall I do feel like at least at the top of the wrap that we have been placing this area has healed quite nicely and looks much better. The remainder of the leg is showing signs  of improvement. Unfortunately in the thigh area he still has an open region on the left and again on the right he has been utilizing just a Band-Aid on an area that also opened on the thigh. Again this is an area that were not able to wrap although we did do an Ace wrap to provide some compression that something that obviously is a little less effective than the compression wraps we have been using on the lower portion of the leg. He does have an appointment with the lymphedema clinic in [facility] on Friday. 07/21/2019 on evaluation today patient appears to be doing better with regard to his lower extremity ulcers. He has been tolerating the dressing changes without complication. Fortunately there is no signs of active infection at this  time. No fevers, chills, nausea, vomiting, or diarrhea. I did receive the paperwork from the physical therapist at the lymphedema clinic in [place]. Subsequently I signed off on that this morning and sent that back to him for further progression with the treatment plan. 07/28/2019 on evaluation today patient appears to be doing very well with regard to his right lower extremity where I do not see any open wounds at this point. Fortunately he is feeling great as far as that is concerned as well. In regard to the left lower extremity he has been having issues with still several areas of weeping and edema although the upper leg is doing better his lower leg still I think is going require the compression wrap at this time. No fevers, chills, nausea, vomiting, or diarrhea. 08/04/2019 on evaluation today patient unfortunately is having new wounds on the right lower extremity. Again we have been using Unna boot wrap on that side. We switched him to using his juxta lite wrap at home. With that being said he tells me he has been using it although his legs extremely swollen and to be honest really does not appear that he has been. I cannot know that for sure however. Nonetheless he has multiple new wounds on the right lower extremity at this time. Obviously we will have to see about getting this rewrapped for him today. 08/11/2019 on evaluation today patient appears to be doing fairly well with regard to his wounds. He has been tolerating the dressing changes including the compression wraps without complication. He still has a lot of edema in his upper thigh regions bilaterally he is supposed to be seeing the lymphedema clinic on the 15th of this month once his wraps arrive for the upper part of his legs. 08/18/2019 on evaluation today patient appears to be doing well with regard to his bilateral lower extremities at this point. He has been tolerating the dressing changes without complication. Fortunately  there is no signs of active infection which is also good news. He does have a couple weeping areas on the first and second toe of the right foot he also has just a small area on the left foot upper leg and a small area on the left lower leg but overall he is doing quite well in my opinion. He is supposed to be getting his wraps shortly in fact tomorrow and then subsequently is seeing the lymphedema clinic next Wednesday on the 21st. Of note he is also leaving on the 25th to go on vacation for a week to the beach. For that reason and since there is some uncertainty about what there can be doing at lymphedema clinic next Wednesday I am get a make an appointment for  next Friday here for [place] to see what we need to do for him prior to him leaving for vacation. 10/23; patient arrives in considerable pain predominantly in the upper posterior calf just distal to the popliteal fossa also in the wound anteriorly above the major wound. This is probably cellulitis and he has had this recurrently in the past. He has no open wound on the right side and he has had an Radio broadcast assistant in that area. Finally I note that he has an area on the left posterior calf which by enlarge is mostly epithelialized. This protrudes beyond the borders of the surrounding skin in the setting of dry scaly skin and lymphedema. The patient is leaving for [facility] on Sunday. Per his longstanding pattern, he will not take his compression pumps with him predominantly out of fear that they will be stolen. He therefore asked that we put a Unna boot back on the right leg. He will also contact the wound care center in [facility] to see if they can change his dressing in the mid week. 11/3; patient returned from his vacation to [facility]. He was seen on 1 occasion at their wound care center. They did a 2 layer compression system as they did not have our 4-layer wrap. I am not completely certain what they put on the wounds. They did not change the  Unna boot on the right. The patient is also seeing a lymphedema specialist physical therapist in [place]. It appears that he has some compression sleeve for his thighs which indeed look quite a bit better than I am used to seeing. He pumps over these with his external compression pumps. 11/10; the patient has a new wound on the right medial thigh otherwise there is no open areas on the right. He has an area on the left leg posteriorly anteriorly and medially and an area over the left second toe. We have been using silver alginate. He thinks the injury on his thigh is secondary to friction from the compression sleeve he has. 11/17; the patient has a new wound on the right medial thigh last week. He thinks this is because he did not have a underlying stocking for his thigh juxta lite apparatus. He now has this. The area is fairly large and somewhat angry but I do not think he has underlying cellulitis. ooHe has a intact blister on the right anterior tibial area. ooSmall wound on the right great toe dorsally ooSmall area on the medial left calf. 11/30; the patient does not have any open areas on his right leg and we did not take his juxta lite stocking off. However he states that on Friday his compression wrap fell down lodging around his upper mid calf area. As usual this creates a lot of problems for him. He called urgently today to be seen for a nurse visit however the nurse visit turned into a provider visit because of extreme erythema and pain in the left anterior tibia extending laterally and posteriorly. The area that is problematic is extensive 10/06/2019 upon evaluation today patient actually appears to be doing poorly in regard to his left lower extremity. He Dr. Leanord Hawking did place him on doxycycline this past Monday apparently due to the fact that he was doing much worse in regard to this left leg. Fortunately the doxycycline does seem to be helping. Unfortunately we are still having a  very difficult time getting his edema under any type of control in order to anticipate discharge at  some point. The only way were really able to control his lymphedema really is with compression wraps and that has only even seemingly temporary. He has been seeing a lymphedema clinic they are trying to help in this regard but still this has been somewhat frustrating in general for the patient. 10/13/19 on evaluation today patient appears to be doing excellent with regard to his right lower extremity as far as the wounds are concerned. His swelling is still quite extensive unfortunately. He is still having a lot of drainage from the thigh areas bilaterally which is unfortunate. He's been going to lymphedema clinic but again he still really does not have this edema under control as far as his lower extremities are concern. With regard to his left lower extremity this seems to be improving and I do believe the doxycycline has been of benefit for him. He is about to complete the doxycycline. 10/20/2019 on evaluation today patient appears to be doing poorly in regard to his bilateral lower extremities. More in the right thigh he has a lot of irritation at this site unfortunately. In regard to the left lower extremity the wrap was not quite as high it appears and does seem to have caused him some trouble as well. Fortunately there is no evidence of systemic infection though he does have some blue-green drainage which has me concerned for the possibility of Pseudomonas. He tells me he is previously taking Cipro without complications and he really does not care for Levaquin however due to some of the side effects he has. He is not allergic to any medications specifically antibiotics that were aware of. 10/27/2019 on evaluation today patient actually does appear to be for the most part doing better when compared to last week's evaluation. With that being said he still has multiple open wounds over the bilateral  lower extremities. He actually forgot to start taking the Cipro and states that he still has the whole bottle. He does have several new blisters on left lower extremity today I think I would recommend he go ahead and take the Cipro based on what I am seeing at this point. 12/30-Patient comes at 1 week visit, 4 layer compression wraps on the left and Unna boot on the right, primary dressing Xtrasorb and silver alginate. Patient is taking his Cipro and has a few more days left probably 5-6, and the legs are doing better. He states he is using his compressions devices which I believe he has 11/10/2019 on evaluation today patient actually appears to be much better than last time I saw him 2 weeks ago. His wounds are significantly improved and overall I am very pleased in this regard. Fortunately there is no signs of active infection at this time. He is just a couple of days away from completing Cipro. Overall his edema is much better he has been using his lymphedema pumps which I think is also helping at this point. 11/17/2019 on evaluation today patient appears to be doing excellent in regard to his wounds in general. His legs are swollen but not nearly as much as they have been in the past. Fortunately he is tolerating the compression wraps without complication. No fevers, chills, nausea, vomiting, or diarrhea. He does have some erythema however in the distal portion of his right lower extremity specifically around the forefoot and toes there is a little bit of warmth here as well. 11/24/2019 on evaluation today patient appears to be doing well with regard to his right lower extremity I  really do not see any open wounds at this point. His left lower extremity does have several open areas and his right medial thigh also is open. Other than this however overall the patient seems to be making good progress and I am very pleased at this point. 12/01/2019 on evaluation today patient appears to be doing poorly at  this point in regard to his left lower extremity has several new blisters despite the fact that we have him in compression wraps. In fact he had a 4-layer compression wrap, his upper thigh wrapped from lymphedema clinic, and a juxta light over top of the 4 layer compression wrap the lymphedema clinic applied and despite all this he still develop blisters underneath. Obviously this does have me concerned about the fact that unfortunately despite what we are doing to try to get wounds healed he continues to have new areas arise I do not think he is ever good to be at the point where he can realistically just use wraps at home to keep things under control. Typically when we heal him it takes about 1-2 days before he is back in the clinic with severe breakdown and blistering of his lower extremities bilaterally. This is happened numerous times in the past. Unfortunately I think that we may need some help as far as overall fluid overload to kind of limit what we are seeing and get things under better control. 12/08/2019 on evaluation today patient presents for follow-up concerning his ongoing bilateral lower extremity edema. Unfortunately he is still having quite a bit of swelling the compression wraps are controlling this to some degree but he did see Dr. [name] his cardiologist I do have that available for review today as far as the appointment was concerned that was on 12/06/2019. Obviously that she has been 2 days ago. The patient states that he is only been taking the Lasix 80 mg 1 time a day he had told me previously he was taking this twice a day. Nonetheless Dr. [name] recommended this be up to 80 mg 2 times a day for the patient as he did appear to be fluid overloaded. With that being said the patient states he did this yesterday and he was unable to go anywhere or do anything due to the fact that he was constantly having to urinate. Nonetheless I think that this is still good to be something that is  important for him as far as trying to get his edema under control at all things that he is going to be able to just expect his wounds to get under control and things to be better without going through at least a period of time where he is trying to stabilize his fluid management in general and I think increasing the Lasix is likely the first step here. It was also mentioned the possibility that the patient may require metolazone. With that being said he wanted to have the patient take Lasix twice a day first and then reevaluating 2 months to see where things stand. 12/15/2019 upon evaluation today patient appears to be doing regard to his legs although his toes are showing some signs of weeping especially on the left at this point to some degree on the right. There does not appear to be any signs of active infection and overall I do feel like the compression wraps are doing well for him but he has not been able to take the Lasix at home and the increased dose that Dr. [name] recommended. He  tells me that just not go to be feasible for him. Nonetheless I think in this case he should probably send a message to Dr. [name] in order to discuss options from the standpoint of possible admission to get the fluid off or otherwise going forward. 12/22/2019 upon evaluation today patient appears to be doing fairly well with regard to his lower extremities at this point. In fact he would be doing excellent if it was not for the fact that his right anterior thigh apparently had an allergic reaction to adhesive tape that he used. The wound itself that we have been monitoring actually appears to be healed. There is a lot of irritation at this point. 12/29/2019 upon evaluation today patient appears to be doing well in regard to his lower extremities. His left medial thigh is open and somewhat draining today but this is the only region that is open the right has done much better with the treatment utilizing the steroid cream  that I prescribed for him last week. Overall I am pleased in that regard. Fortunately there is no signs of active infection at this time. No fevers, chills, nausea, vomiting, or diarrhea. 01/05/2020 upon evaluation today patient appears to be doing more poorly in regard to his right lower extremity at this point upon evaluation today. Unfortunately he continues to have issues in this regard and I think the biggest issue is controlling his edema. This obviously is not very well controlled at this point is been recommended that he use the Lasix twice a day but he has not been able to do that. Unfortunately I think this is leading to an issue where honestly he is not really able to effectively control his edema and therefore the wounds really are not doing significantly better. I do not think that he is going to be able to keep things under good control unless he is able to control his edema much better. I discussed this again in great detail with him today. 01/12/2020 good news is patient actually appears to be doing quite well today at this point. He does have an appointment with lymphedema clinic tomorrow. His legs appear healed and the toe on the left is almost completely healed. In general I am very pleased with how things stand at this point. 01/19/2020 upon evaluation today patient appears to actually be doing well in regard to his lower extremities there is nothing open at this point. Fortunately he has done extremely well more recently. Has been seeing lymphedema clinic as well. With that being said he has Velcro wraps for his lower legs as well as his upper legs. The only wound really is on his toe which is the right great toe and this is barely anything even there. With all that being said I think it is good to be appropriate today to go ahead and switch him over to the Velcro compression wraps. 01/26/2020 upon evaluation today patient appears to be doing worse with regard to his lower extremities  after last week switch him to Velcro compression wraps. Unfortunately he lasted less than 24 hours he did not have the sock portion of his Velcro wrap on the left leg and subsequently developed a blister underneath the Velcro portion. Obviously this is not good and not what we were looking for at this point. He states the lymphedema clinic did tell him to wear the wrap for 23 hours and take him off for 1 I am okay with that plan but again right now we  got a get things back under control again he may have some cellulitis noted as well. 02/02/2020 upon evaluation today patient unfortunately appears to have several areas of blistering on his bilateral lower extremities today mainly on the feet. His legs do seem to be doing somewhat better which is good news. Fortunately there is no evidence of active infection at this time. No fevers, chills, nausea, vomiting, or diarrhea. 02/16/2020 upon evaluation today patient appears to be doing well at this time with regard to his legs. He has a couple weeping areas on his toes but for the most part everything is doing better and does appear to be sealed up on his legs which is excellent news. We can continue with wrapping him at this point as he had every time we discontinue the wraps he just breaks out with new wounds. There is really no point in is going forward with this at this point. 03/08/2020 upon evaluation today patient actually appears to be doing quite well with regard to his lower extremity ulcers. He has just a very superficial and really almost nonexistent blister on the left lower extremity he has in general done very well with the compression wraps. With that being said I do not see any signs of infection at this time which is good news. 03/29/2020 upon evaluation today patient appears to be doing well with regard to his wounds currently except for where he had several new areas that opened up due to some of the wrap slipping and causing him trouble. He  states he did not realize they had slipped. Nonetheless he has a 1 area on the right and 3 new areas on the left. Fortunately there is no signs of active infection at this time which is great news. 04/05/2020 upon evaluation today patient actually appears to be doing quite well in general in regard to his legs currently. Fortunately there is no signs of active infection at this time. No fevers, chills, nausea, vomiting, or diarrhea. He tells me next week that he will actually be seen in the lymphedema clinic on Thursday at 10 AM I see him on Wednesday next week. 04/12/2020 upon evaluation today patient appears to be doing very well with regard to his lower extremities bilaterally. In fact he does not appear to have any open wounds at this point which is good news. Fortunately there is no signs of active infection at this time. No fevers, chills, nausea, vomiting, or diarrhea. 04/19/2020 upon evaluation today patient appears to be doing well with regard to his wounds currently on the bilateral lower extremities. There does not appear to be any signs of active infection at this time. Fortunately there is no evidence of systemic infection and overall very pleased at this point. Nonetheless after I held him out last week he literally had blisters the next morning already which swelled up with him being right back here in the clinic. Overall I think that he is just not can be able to be discharged with his legs the way they are he is much to volume overloaded as far as fluid is concerned and that was discussed with him today of also discussed this but should try the clinic nurse manager as well as Dr. Leanord Hawking. 04/26/2020 upon evaluation today patient appears to be doing better with regard to his wounds currently. He is making some progress and overall swelling is under good control with the compression wraps. Fortunately there is no evidence of active infection at this time. 05/10/2020  on evaluation today patient  appears to be doing overall well in regard to his lower extremities bilaterally. He is Tolerating the compression wraps without complication and with what we are seeing currently I feel like that he is making excellent progress. There is no signs of active infection at this time. 05/24/2020 upon evaluation today patient appears to be doing well in regard to his legs. The swelling is actually quite a bit down compared to where it has been in the past. Fortunately there is no sign of active infection at this time which is also good news. With that being said he does have several wounds on his toes that have opened up at this point. 05/31/2020 upon evaluation today patient appears to be doing well with regard to his legs bilaterally where he really has no significant fluid buildup at this point overall he seems to be doing quite well. Very pleased in this regard. With regard to his toes these also seem to be drying up which is excellent. We have continue to wrap him as every time we tried as a transition to the juxta light wraps things just do not seem to get any better. 06/07/2020 upon evaluation today patient appears to be doing well with regard to his right leg at this point. Unfortunately left leg has a lot of blistering he tells me the wrap started to slide down on him when he tried to put his other Velcro wrap over top of it to help keep things in order but nonetheless still had some issues. 06/14/2020 on evaluation today patient appears to be doing well with regard to his lower extremity ulcers and foot ulcers at this point. I feel like everything is actually showing signs of improvement which is great news overall there is no signs of active infection at this time. No fevers, chills, nausea, vomiting, or diarrhea. 06/21/2020 on evaluation today patient actually appears to be doing okay in regard to his wounds in general. With that being said the biggest issue I see is on his right foot in particular the  first and second toe seem to be doing a little worse due to the fact this is staying very wet. I think he is probably getting need to change out his dressings a couple times in between each week when we see him in regard to his toes in order to keep this drier based on the location and how this is proceeding. 06/28/2020 on evaluation today patient appears to be doing a little bit more poorly overall in regard to the appearance of the skin I am actually somewhat concerned about the possibility of him having a little bit of an infection here. We discussed the course of potentially giving him a doxycycline prescription which he is taken previously with good result. With that being said I do believe that this is potentially mild and at this point easily fixed. I just do not want anything to get any worse. 07/12/2020 upon evaluation today patient actually appears to be making some progress with regard to his legs which is great news there does not appear to be any evidence of active infection. Overall very pleased with where things stand. 07/26/2020 upon evaluation today patient appears to be doing well with regard to his leg ulcers and toe ulcers at this point. He has been tolerating the compression wraps without complication overall very pleased in this regard. 08/09/2020 upon evaluation today patient appears to be doing well with regard to his lower extremities bilaterally.  Fortunately there is no signs of active infection overall I am pleased with where things stand. 08/23/2020 on evaluation today patient appears to be doing well with regard to his wound. He has been tolerating the dressing changes without complication. Fortunately there is no signs of active infection at this time. Overall his legs seem to be doing quite well which is great news and I am very pleased in that regard. No fevers, chills, nausea, vomiting, or diarrhea. 09/13/2020 upon evaluation today patient appears to be doing okay in  regard to his lower extremities. He does have a fairly large blister on the right leg which I did remove the blister tissue from today so we can get this to dry out other than that however he seems to be doing quite well. There is no signs of active infection at this time. 09/27/2020 upon evaluation today patient appears to actually be doing some better in regard to his right leg. Fortunately signs of active infection at this time which is great news. No fevers, chills, nausea, vomiting, or diarrhea. 10/04/2020 upon evaluation today patient actually appears to be showing signs of improvement which is great news with regard to his leg ulcers. Fortunately there is no signs of active infection which is great news he is still taking the antibiotics currently. No fevers, chills, nausea, vomiting, or diarrhea. 10/18/2020 on evaluation today patient appears to be doing well with regard to his legs currently. He has been tolerating the dressing changes including the wraps without complication. Fortunately there is no signs of active infection at this time. No fevers, chills, nausea, vomiting, or diarrhea. 10/25/2020 upon evaluation today patient appears to be doing decently well in regard to his wounds currently. He has been tolerating the dressing changes without complication. Overall I feel like he is making good progress albeit slow. Again this is something we can have to continue to wrap for some time to come most likely. 11/08/2020 upon evaluation today patient appears to be doing well with regard to his wounds currently. He has been tolerating the dressing changes without complication is not currently on any antibiotics and he does not appear to show any signs of infection. He does continue to have a lot of drainage on the right leg not too severe but nonetheless this is very scattered. On the left leg this is looking to be much improved overall. 11/15/2020 upon evaluation today patient appears to be doing  better with regard to his legs bilaterally. Especially the right leg which was much more significant last week. There does not appear to be any signs of active infection which is great news. No fevers, chills, nausea, vomiting, or diarrhea. 11/23/2019 upon evaluation today patient appears to be doing poorly still in regard to his lower extremities bilaterally. Unfortunately his right leg in particular appears to be doing much more poorly there is no signs really of infection this is not warm to touch but he does have a lot of drainage and weeping unfortunately. With that reason I do believe that we may need to initiate some treatment here to try to help calm down some of the swelling of the right leg. I think switching to a 4-layer compression wrap would be beneficial here. The patient is in agreement with giving this a try. Objective Constitutional Obese and well-hydrated in no acute distress. Vitals Time Taken: 10:55 AM, Height: 70 in, Weight: 380.2 lbs, BMI: 54.5, Temperature: 97.9 F, Pulse: 62 bpm, Respiratory Rate: 20 breaths/min, Blood Pressure: 143/62 mmHg,  Capillary Blood Glucose: 140 mg/dl. Respiratory normal breathing without difficulty. Psychiatric this patient is able to make decisions and demonstrates good insight into disease process. Alert and Oriented x 3. pleasant and cooperative. General Notes: Upon inspection patient's wound bed actually showed signs of fairly good granulation and epithelization at all locations. The right leg still is weeping pretty much circumferentially. I believe we need to get more of the edema under control here. I discussed that with the patient today. He is in agreement with attempting a 4-layer compression wrap which I think would be superior to the [employer] we have been using currently. Integumentary (Hair, Skin) Wound #185 status is Open. Original cause of wound was Blister. The wound is located on the Right,Circumferential Lower Leg. The wound  measures 13cm length x 29cm width x 0.1cm depth; 296.095cm^2 area and 29.61cm^3 volume. There is Fat Layer (Subcutaneous Tissue) exposed. There is no tunneling or undermining noted. There is a large amount of serous drainage noted. The wound margin is distinct with the outline attached to the wound base. There is medium (34-66%) red, pink granulation within the wound bed. There is a medium (34-66%) amount of necrotic tissue within the wound bed including Adherent Slough. Wound #189 status is Open. Original cause of wound was Trauma. The wound is located on the Right,Dorsal Forearm. The wound measures 2.5cm length x 1.5cm width x 0.1cm depth; 2.945cm^2 area and 0.295cm^3 volume. There is Fat Layer (Subcutaneous Tissue) exposed. There is no tunneling or undermining noted. There is a medium amount of serosanguineous drainage noted. The wound margin is distinct with the outline attached to the wound base. There is large (67-100%) granulation within the wound bed. There is no necrotic tissue within the wound bed. Wound #190 status is Open. Original cause of wound was Gradually Appeared. The wound is located on the Right T Second. The wound measures 2.9cm oe length x 2.2cm width x 0.1cm depth; 5.011cm^2 area and 0.501cm^3 volume. There is no tunneling or undermining noted. There is a large amount of serosanguineous drainage noted. The wound margin is distinct with the outline attached to the wound base. There is large (67-100%) red, pink granulation within the wound bed. There is no necrotic tissue within the wound bed. 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 There was a Four Layer Compression Therapy Procedure by Fonnie Mu, RN. Post procedure Diagnosis Wound #185: Same as Pre-Procedure There was a Four Layer Compression Therapy Procedure by Fonnie Mu, RN. Post procedure Diagnosis Wound #: Same as Pre-Procedure Plan Follow-up Appointments: Return Appointment in 1 week. Bathing/ Shower/ Hygiene: May shower with protection but do not get wound dressing(s) wet. Edema Control - Lymphedema / SCD / Other: Lymphedema Pumps. Use Lymphedema pumps on leg(s) 2-3 times a day for 45-60 minutes. If wearing any wraps or hose, do not remove them. Continue exercising as instructed. Elevate legs to the level of the heart or above for 30 minutes daily and/or when sitting, a frequency of: - throughout the day Avoid standing for long periods of time. Exercise regularly  Other Edema Control Orders/Instructions: - 4 layer compression wrap to left leg Non Wound Condition: Other Non Wound Condition Orders/Instructions: - lotion to leg, 4 layer compression wrap WOUND #185: - Lower Leg Wound Laterality: Right, Circumferential Peri-Wound Care: Sween Lotion (Moisturizing lotion) 1 x Per Week/ Discharge Instructions: Apply moisturizing lotion as directed Prim Dressing: KerraCel Ag Gelling Fiber Dressing, 4x5 in (silver alginate) 1 x Per Week/ ary Discharge Instructions: Apply silver alginate to wound bed as instructed Secondary Dressing: ABD Pad, 8x10 1 x Per Week/ Discharge Instructions: Apply over primary dressing as directed. Secondary Dressing: CarboFLEX Odor Control Dressing, 4x4 in 1 x Per Week/ Discharge Instructions: Apply over primary dressing as directed. Com pression Wrap: Unnaboot w/Calamine, 4x10 (in/yd) 1 x Per Week/ Discharge Instructions: Apply  Unnaboot as directed. WOUND #189: - Forearm Wound Laterality: Dorsal, Right Secondary Dressing: ComfortFoam Border, 4x4 in (silicone border) 1 x Per Week/7 Days Discharge Instructions: Apply over primary dressing as directed. WOUND #190: - T Second Wound Laterality: Right oe Prim Dressing: KerraCel Ag Gelling Fiber Dressing, 2x2 in (silver alginate) 1 x Per Week/7 Days ary Discharge Instructions: Apply silver alginate to wound bed as instructed Secondary Dressing: Woven Gauze Sponges 2x2 in 1 x Per Week/7 Days Discharge Instructions: Apply over primary dressing as directed. Secured With: Insurance underwriter, Sterile 2x75 (in/in) 1 x Per Week/7 Days Discharge Instructions: Secure with stretch gauze as directed. 1. Would recommend currently that we go ahead and continue with the wound care measures as before with regard to the silver alginate that we will utilize a 4- layer compression wrap bilaterally which I think would be superior to the Unna boot on the right that we have been utilizing. 2. Also can recommend the patient needs to continue pumping as well as elevating his legs I think this is still of utmost importance. 3. I would also recommend that he continue to monitor for any signs of increased pain, fevers, chills, nausea, vomiting, or diarrhea which would get infection though I do not see any evidence of infection on physical exam today. We will see patient back for reevaluation in 1 week here in the clinic. If anything worsens or changes patient will contact our office for additional recommendations. Electronic Signature(s) Signed: 11/22/2020 11:25:27 AM By: Lenda Kelp PA-C Entered By: Lenda Kelp on 11/22/2020 11:25:27 -------------------------------------------------------------------------------- SuperBill Details Patient Name: Date of Service: [name], [place] J. 11/22/2020 Medical Record Number: [id number] Patient Account Number: [id number] Date of  Birth/Sex: Treating RN: 1951/03/31 (70 y.o. [name] Primary Care Provider: Nicoletta Ba Other Clinician: Referring Provider: Treating Provider/Extender: Adele Dan in Treatment: 252 Diagnosis Coding ICD-10 Codes Code Description [id number] Non-pressure chronic ulcer of right calf limited to breakdown of skin L97.221 Non-pressure chronic ulcer of left calf limited to breakdown of skin 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 of bilateral lower extremity I89.0 Lymphedema, not elsewhere classified E11.622 Type 2 diabetes mellitus with other skin ulcer 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, unspecified L03.116 Cellulitis of left lower limb Facility Procedures CPT4: Code [id number] foot Description: 1 BILATERAL: Application of multi-layer venous compression system; leg (below knee), including ankle and . Modifier: Quantity: 1 Physician Procedures : CPT4 Code Description Modifier [id number] - WC PHYS LEVEL 3 - EST PT ICD-10 Diagnosis Description 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 skin L97.221 Non-pressure chronic ulcer of left calf limited to breakdown of  skin I87.333 Chronic  venous hypertension (idiopathic) with ulcer and inflammation of bilateral lower extremity I89.0 Lymphedema, not elsewhere classified Quantity: 1 Electronic Signature(s) Signed: 11/22/2020 11:26:37 AM By: Lenda Kelp PA-C Entered By: Lenda Kelp on 11/22/2020 11:26:33

## 2020-11-27 DIAGNOSIS — L97511 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 of skin: Secondary | ICD-10-CM | POA: Diagnosis not present

## 2020-11-27 DIAGNOSIS — Z794 Long term (current) use of insulin: Secondary | ICD-10-CM | POA: Diagnosis not present

## 2020-11-27 DIAGNOSIS — B351 Tinea unguium: Secondary | ICD-10-CM | POA: Diagnosis not present

## 2020-11-27 DIAGNOSIS — E1142 Type 2 diabetes mellitus with diabetic polyneuropathy: Secondary | ICD-10-CM | POA: Diagnosis not present

## 2020-11-29 ENCOUNTER — Encounter (HOSPITAL_BASED_OUTPATIENT_CLINIC_OR_DEPARTMENT_OTHER): Payer: Medicare Other | Admitting: Physician Assistant

## 2020-11-29 ENCOUNTER — Other Ambulatory Visit: Payer: Self-pay

## 2020-11-29 DIAGNOSIS — L97812 Non-pressure chronic ulcer of other part of right lower leg with fat layer exposed: Secondary | ICD-10-CM | POA: Diagnosis not present

## 2020-11-29 DIAGNOSIS — L97519 Non-pressure chronic ulcer of other part of right foot with unspecified severity: Secondary | ICD-10-CM | POA: Diagnosis not present

## 2020-11-29 DIAGNOSIS — I890 Lymphedema, not elsewhere classified: Secondary | ICD-10-CM | POA: Diagnosis not present

## 2020-11-29 DIAGNOSIS — I87331 Chronic venous hypertension (idiopathic) with ulcer and inflammation of right lower extremity: Secondary | ICD-10-CM | POA: Diagnosis not present

## 2020-11-29 DIAGNOSIS — I87333 Chronic venous hypertension (idiopathic) with ulcer and inflammation of bilateral lower extremity: Secondary | ICD-10-CM | POA: Diagnosis not present

## 2020-11-29 DIAGNOSIS — E1140 Type 2 diabetes mellitus with diabetic neuropathy, unspecified: Secondary | ICD-10-CM | POA: Diagnosis not present

## 2020-11-29 DIAGNOSIS — L98492 Non-pressure chronic ulcer of skin of other sites with fat layer exposed: Secondary | ICD-10-CM | POA: Diagnosis not present

## 2020-11-29 DIAGNOSIS — E11621 Type 2 diabetes mellitus with foot ulcer: Secondary | ICD-10-CM | POA: Diagnosis not present

## 2020-11-29 DIAGNOSIS — N289 Disorder of kidney and ureter, unspecified: Secondary | ICD-10-CM | POA: Diagnosis not present

## 2020-11-29 DIAGNOSIS — E11622 Type 2 diabetes mellitus with other skin ulcer: Secondary | ICD-10-CM | POA: Diagnosis not present

## 2020-11-29 DIAGNOSIS — E1129 Type 2 diabetes mellitus with other diabetic kidney complication: Secondary | ICD-10-CM | POA: Diagnosis not present

## 2020-11-29 NOTE — Progress Notes (Addendum)
MASAKI, ROTHBAUER ([id number]) Visit Report for 11/29/2020 Chief Complaint Document Details Patient Name: Date of Service: [name], [name] 11/29/2020 10:30 A M Medical Record Number: [id number] Patient Account Number: [id number] Date of Birth/Sex: Treating RN: [birth year] (70 y.o. [name] Primary Care Provider: Nicoletta Ba Other Clinician: Referring Provider: Treating Provider/Extender: Adele Dan in Treatment: [phone] Information Obtained from: Patient Chief Complaint patient is here for evaluation venous/lymphedema weeping Electronic Signature(s) Signed: 11/29/2020 10:35:27 AM By: Lenda Kelp PA-C Entered By: Lenda Kelp on 11/29/2020 10:35:27 -------------------------------------------------------------------------------- HPI Details Patient Name: Date of Service: [name], [name] 11/29/2020 10:30 A M Medical Record Number: [id number] Patient Account Number: [id number] Date of Birth/Sex: Treating RN: 1951/09/27 (70 y.o. [name] Primary Care Provider: Nicoletta Ba Other Clinician: Referring Provider: Treating Provider/Extender: Adele Dan in Treatment: 253 History of Present Illness HPI Description: Referred by PCP for consultation. Patient has long standing history of BLE venous stasis, no prior ulcerations. At beginning of month, developed cellulitis and weeping. Received IM Rocephin followed by Keflex and resolved. Wears compression stocking, appr 6 months old. Not sure strength. No present drainage. 01/22/16 this is a patient who is a type II diabetic on insulin. 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 He tells me he religiously wears pressure stockings of uncertain strength. He was here with weeping edema about 8 months ago but did not have an open wound. Roughly a month ago he had a reopening on his bilateral legs. He is been using bandages and Neosporin. He  does not complain of pain. 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 Last BUN/creatinine I have is from 11/20/15 at 13 and 1.0 respectively 01/29/16; patient arrives today having tolerated the Profore wrap. He brought in his stockings and these are 18 mmHg stockings he bought from [employer]. The compression here is likely inadequate. He does not complain of pain or excessive drainage she has no systemic symptoms. The wound on the right looks improved as does the one on the left although one on the left is more substantial with still tissue at risk below the actual wound area on the bilateral posterior calf 02/05/16; patient arrives with poor edema control. He states that we did put a 4 layer compression on it last week. No weight appear 5 this. 02/12/16; the area on the posterior right Has healed. The left Has a substantial wound that has necrotic surface eschar that requires a debridement with a curette. 02/16/16;the patient called or a Nurse visit secondary to increased swelling. He had been in earlier in the week with his right leg healed. He was transitioned to is on pressure stocking on the right leg with the only open wound on the left, a substantial area on the left posterior calf. Note he has a history of severe lower extremity edema, he has a history of chronic atrial fibrillation but not heart failure per my notes but I'll need to research this. He is not complaining of chest pain shortness of breath or orthopnea. The intake nurse noted blisters on the previously closed right leg 02/19/16; this is the patient's regular visit day. I see him on Friday with escalating edema new wounds on the right leg and clear signs of at least right ventricular heart failure. I increased his Lasix to 40 twice a day.  He is returning currently in follow-up. States he is noticed a decrease in that the  edema 02/26/16 patient's legs have much less edema. There is nothing really open on the right leg. The left leg has improved condition of the large superficial wound on the posterior left leg 03/04/16; edema control is very much better. The patient's right leg wounds have healed. On the left leg he continues to have severe venous inflammation on the posterior aspect of the left leg. There is no tenderness and I don't think any of this is cellulitis. 03/11/16; patient's right leg is married healed and he is in his own stocking. The patient's left leg has deteriorated somewhat. There is a lot of erythema around the wound on the posterior left leg. There is also a significant rim of erythema posteriorly just above where the wrap would've ended there is a new wound in this location and a lot of tenderness. Can't rule out cellulitis in this area. 03/15/16; patient's right leg remains healed and he is in his own stocking. The patient's left leg is much better than last review. His major wound on the posterior aspect of his left Is almost fully epithelialized. He has 3 small injuries from the wraps. Really. Erythema seems a lot better on antibiotics 03/18/16; right leg remains healed and he is in his own stocking. The patient's left leg is much better. The area on the posterior aspect of the left calf is fully epithelialized. His 3 small injuries which were wrap injuries on the left are improved only one seems still open his erythema has resolved 03/25/16; patient's right leg remains healed and he is in his own stocking. There is no open area today on the left leg posterior leg is completely closed up. His wrap injuries at the superior aspect of his leg are also resolved. He looks as though he has some irritation on the dorsal ankle but this is fully epithelialized without evidence of infection. 03/28/16; we discharged this patient on Monday. Transitioned him into his own stocking. There were problems almost  immediately with uncontrolled swelling weeping edema multiple some of which have opened. He does not feel systemically unwell in particular no chest pain no shortness of breath and he does not feel 04/08/16; the edema is under better control with the Profore light wrap but he still has pitting edema. There is one large wound anteriorly 2 on the medial aspect of his left leg and 3 small areas on the superior posterior calf. Drainage is not excessive he is tolerating a Profore light well 04/15/16; put a Profore wrap on him last week. This is controlled is edema however he had a lot of pain on his left anterior foot most of his wounds are healed 04/22/16 once again the patient has denuded areas on the left anterior foot which he states are because his wrap slips up word. He saw his primary physician today is on Lasix 40 twice a day and states that he his weight is down 20 pounds over the last 3 months. 04/29/16: Much improved. left anterior foot much improved. He is now on Lasix 80 mg per day. Much improved edema control 05/06/16; I was hoping to be able to discharge him today however once again he has blisters at a low level of where the compression was placed last week mostly on his left lateral but also his left medial leg and a small area on the anterior part of the left foot. 05/09/16; apparently the patient went  home after his appointment on 7/4 later in the evening developing pain in his upper medial thigh together with subjective fever and chills although his temperature was not taken. The pain was so intense he felt he would probably have to call 911. However he then remembered that he had leftover doxycycline from a previous round of antibiotics and took these. By the next morning he felt a lot better. He called and spoke to one of our nurses and I approved doxycycline over the phone thinking that this was in relation to the wounds we had previously seen although they were definitely were not. The  patient feels a lot better old fever no chills he is still working. Blood sugars are reasonably controlled 05/13/16; patient is back in for review of his cellulitis on his anterior medial upper thigh. He is taking doxycycline this is a lot better. Culture I did of the nodular area on the dorsal aspect of his foot grew MRSA this also looks a lot better. 05/20/16; the patient is cellulitis on the medial upper thigh has resolved. All of his wound areas including the left anterior foot, areas on the medial aspect of the left calf and the lateral aspect of the calf at all resolved. He has a new blister on the left dorsal foot at the level of the fourth toe this was excised. No evidence of infection 05/27/16; patient continues to complain weeping edema. He has new blisterlike wounds on the left anterior lateral and posterior lateral calf at the top of his wrap levels. The area on his left anterior foot appears better. He is not complaining of fever, pain or pruritus in his feet. 05/30/16; the patient's blisters on his left anterior leg posterior calf all look improved. He did not increase the Lasix 100 mg as I suggested because he was going to run out of his 40 mg tablets. He is still having weeping edema of his toes 06/03/16; I renewed his Lasix at 80 mg once a day as he was about to run out when I last saw him. He is on 80 mg of Lasix now. I have asked him to cut down on the excessive amount of water he was drinking and asked him to drink according to his thirst mechanisms 06/12/2016 -- was seen 2 days ago and was supposed to wear his compression stockings at home but he is developed lymphedema and superficial blisters on the left lower extremity and hence came in for a review 06/24/16; the remaining wound is on his left anterior leg. He still has edema coming from between his toes. There is lymphedema here however his edema is generally better than when I last saw this. He has a history of atrial fibrillation  but does not have a known history of congestive heart failure nevertheless I think he probably has this at least on a diastolic basis. 07/01/16 I reviewed his echocardiogram from January 2017. This was essentially normal. He did not have LVH, EF of 55-60%. His right ventricular function was normal although he did have trivial tricuspid and pulmonic regurgitation. This is not audible on exam however. I increased his Lasix to do massive edema in his legs well above his knees I think in early July. He was also drinking an excessive amount of water at the time. 07/15/16; missed his appointment last week because of the Labor Day holiday on Monday. He could not get another appointment later in the week. Started to feel the wrap digging in superiorly so  we remove the top half and the bottom half of his wrap. He has extensive erythema and blistering superiorly in the left leg. Very tender. Very swollen. Edema in his foot with leaking edema fluid. He has not been systemically unwell 07/22/16; the area on the left leg laterally required some debridement. The medial wounds look more stable. His wrap injury wounds appear to have healed. Edema and his foot is better, weeping edema is also better. He tells me he is meeting with the supplier of the external compression pumps at work 08/05/16; the patient was on vacation last week in [facility]. His wrap is been on for an extended period of time. Also over the weekend he developed an extensive area of tender erythema across his anterior medial thigh. He took to doxycycline yesterday that he had leftover from a previous prescription. The patient complains of weeping edema coming out of his toes 08/08/16; I saw this patient on 10/2. He was tender across his anterior thigh. I put him on doxycycline. He returns today in follow-up. He does not have any open wounds on his lower leg, he still has edema weeping into his toes. 08/12/16; patient was seen back urgently today to  follow-up for his extensive left thigh cellulitis/erysipelas. He comes back with a lot less swelling and erythema pain is much better. I believe I gave him Augmentin and Cipro. His wrap was cut down as he stated a roll down his legs. He developed blistering above the level of the wrap that remained. He has 2 open blisters and 1 intact. 08/19/16; patient is been doing his primary doctor who is increased his Lasix from 40-80 once a day or 80 already has less edema. Cellulitis has remained improved in the left thigh. 2 open areas on the posterior left calf 08/26/16; he returns today having new open blisters on the anterior part of his left leg. He has his compression pumps but is not yet been shown how to use some vital representative from the supplier. 09/02/16 patient returns today with no open wounds on the left leg. Some maceration in his plantar toes 09/10/2016 -- Dr. Leanord Hawking had recently discharged him on 09/02/2016 and he has come right back with redness swelling and some open ulcers on his left lower extremity. He says this was caused by trying to apply his compression stockings and he's been unable to use this and has not been able to use his lymphedema pumps. He had some doxycycline leftover and he has started on this a few days ago. 09/16/16; there are no open wounds on his leg on the left and no evidence of cellulitis. 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 He does not complain of symptoms here. I am not clear use using his external compression pumps. 09/23/16; I have not seen this patient in 2 weeks. He canceled his appointment 10 days ago as he was going on vacation. He tells me that on Monday he noticed a large area on his posterior left leg which is been draining copiously and is reopened into a large wound. He is been using ABDs and the external part of his juxtalite, according to our nurse this was not on properly. 10/07/16;  Still a substantial area on the posterior left leg. Using silver alginate 10/14/16; in general better although there is still open area which looks healthy. Still using silver alginate. He reminds me that this happen before he left for [facility].  T oday while he was showering in the morning. He had been using his juxtalite's 10/21/16; the area on his posterior left leg is fully epithelialized. However he arrives today with a large area of tender erythema in his medial and posterior left thigh just above the knee. I have marked the area. Once again he is reluctant to consider hospitalization. I treated him with oral antibiotics in the past for a similar situation with resolution I think with doxycycline however this area it seems more extensive to me. He is not complaining of fever but does have chills and says states he is thirsty. His blood sugar today was in the 140s at home 10/25/16 the area on his posterior left leg is fully epithelialized although there is still some weeping edema. The large area of tenderness and erythema in his medial and posterior left thigh is a lot less tender although there is still a lot of swelling in this thigh. He states he feels a lot better. He is on doxycycline and Augmentin that I started last week. This will continued until Tuesday, December 26. I have ordered a duplex ultrasound of the left thigh rule out DVT whether there is an abscess something that would need to be drained I would also like to know. 11/01/16; he still has weeping edema from a not fully epithelialized area on his left posterior calf. Most of the rest of this looks a lot better. He has completed his antibiotics. His thigh is a lot better. Duplex ultrasound did not show a DVT in the thigh 11/08/16; he comes in today with more Denuded surface epithelium from the posterior aspect of his calf. There is no real evidence of cellulitis. The superior aspect of his wrap appears to have put quite an  indentation in his leg just below the knee and this may have contributed. He does not complain of pain or fever. We have been using silver alginate as the primary dressing. The area of cellulitis in the right thigh has totally resolved. He has been using his compression stockings once a week 11/15/16; the patient arrives today with more loss of epithelium from the posterior aspect of his left calf. He now has a fairly substantial wound in this area. The reason behind this deterioration isn't exactly clear although his edema is not well controlled. He states he feels he is generally more swollen systemically. He is not complaining of chest pain shortness of breath fever. T me he has an appointment with his primary physician in early February. He is on 80 mg of oral ells Lasix a day. He claims compliance with the external compression pumps. He is not having any pain in his legs similar to what he has with his recurrent cellulitis 11/22/16; the patient arrives a follow-up of his large area on his left lateral calf. This looks somewhat better today. He came in earlier in the week for a dressing change since I saw him a week ago. He is not complaining of any pain no shortness of breath no chest pain 11/28/16; the patient arrives for follow-up of his large area on the left lateral calf this does not look better. In fact it is larger weeping edema. The surface of the wound does not look too bad. We have been using silver alginate although I'm not certain that this is a dressing issue. 12/05/16; again the patient follows up for a large wound on the left lateral and left posterior calf this does not look better. There continues  to be weeping edema necrotic surface tissue. More worrisome than this once again there is erythema below the wound involving the distal Achilles and heel suggestive of cellulitis. He is on his feet working most of the day of this is not going well. We are changing his dressing twice a week to  facilitate the drainage. 12/12/16; not much change in the overall dimensions of the large area on the left posterior calf. This is very inflamed looking. I gave him an. Doxycycline last week does not really seem to have helped. He found the wrap very painful indeed it seems to of dog into his legs superiorly and perhaps around the heel. He came in early today because the drainage had soaked through his dressings. 12/19/16- patient arrives for follow-up evaluation of his left lower extremity ulcers. He states that he is using his lymphedema pumps once daily when there is "no drainage". He admits to not using his lipedema pumps while under current treatment. His blood sugars have been consistently between 150-200. 12/26/16; the patient is not using his compression pumps at home because of the wetness on his feet. I've advised him that I think it's important for him to use this daily. He finds his feet too wet, he can put a plastic bag over his legs while he is in the pumps. Otherwise I think will be in a vicious circle. We are using silver alginate to the major area on his left posterior calf 01/02/17; the patient's posterior left leg has further of all into 3 open wounds. All of them covered with a necrotic surface. He claims to be using his compression pumps once a day. His edema control is marginal. Continue with silver alginate 01/10/17; the patient's left posterior leg actually looks somewhat better. There is less edema, less erythema. Still has 3 open areas covered with a necrotic surface requiring debridement. He claims to be using his compression pumps once a day his edema control is better 01/17/17; the patient's left posterior calf look better last week when I saw him and his wrap was changed 2 days ago. He has noted increasing pain in the left heel and arrives today with much larger wounds extensive erythema extending down into the entire heel area especially tender medially. He is not  systemically unwell CBGs have been controlled no fever. Our intake nurse showed me limegreen drainage on his AVD pads. 01/24/17; his usual this patient responds nicely to antibiotics last week giving him Levaquin for presumed Pseudomonas. The whole entire posterior part of his leg is much better much less inflamed and in the case of his Achilles heel area much less tender. He has also had some epithelialization posteriorly there are still open areas here and still draining but overall considerably better 01/31/17- He has continue to tolerate the compression wraps. he states that he continues to use the lymphedema pumps daily, and can increase to twice daily on the weekends. He is voicing no complaints or concerns regarding his LLE ulcers 02/07/17-he is here for follow-up evaluation. He states that he noted some erythema to the left medial and anterior thigh, which he states is new as of yesterday. He is concerned about recurrent cellulitis. He states his blood sugars have been slightly elevated, this morning in the 180s 02/14/17; he is here for follow-up evaluation. When he was last here there was erythema superiorly from his posterior wound in his anterior thigh. He was prescribed Levaquin however a culture of the wound surface grew MRSA over the  phone I changed him to doxycycline on Monday and things seem to be a lot better. 02/24/17; patient missed his appointment on Friday therefore we changed his nurse visit into a physician visit today. Still using silver alginate on the large area of the posterior left thigh. He isn't new area on the dorsal left second toe 03/03/17; actually better today although he admits he has not used his external compression pumps in the last 2 days or so because of work responsibilities over the weekend. 03/10/17; continued improvement. External compression pumps once a day almost all of his wounds have closed on the posterior left calf. Better edema control 03/17/17; in general  improved. He still has 3 small open areas on the lateral aspect of his left leg however most of the area on the posterior part of his leg is epithelialized. He has better edema control. He has an ABD pad under his stocking on the right anterior lower leg although he did not let us look at that today. 03/24/17; patient arrives back in clinic today with no open areas however there are areas on the posterior left calf and anterior left calf that are less than 100% epithelialized. His edema is well controlled in the left lower leg. There is some pitting edema probably lymphedema in the left upper thigh. He uses compression pumps at home once per day. I tried to get him to do this twice a day although he is very reticent. 04/01/2017 -- for the last 2 days he's had significant redness, tenderness and weeping and came in for an urgent visit today. 04/07/17; patient still has 6 more days of doxycycline. He was seen by Dr. Meyer Russel last Wednesday for cellulitis involving the posterior aspect, lateral aspect of his Involving his heel. For the most part he is better there is less erythema and less weeping. He has been on his feet for 12 hours 2 over the weekend. Using his compression pumps once a day 04/14/17 arrives today with continued improvement. Only one area on the posterior left calf that is not fully epithelialized. He has intense bilateral venous inflammation associated with his chronic venous insufficiency disease and secondary lymphedema. We have been using silver alginate to the left posterior calf wound In passing he tells [place] today that the right leg but we have not seen in quite some time has an open area on it but he doesn't want [place] to look at this today states he will show this to [place] next week. 04/21/17; there is no open area on his left leg although he still reports some weeping edema. He showed [place] his right leg today which is the first time we've seen this leg in a long time. He has a large area of  open wound on the right leg anteriorly healthy granulation. Quite a bit of swelling in the right leg and some degree of venous inflammation. He told us about the right leg in passing last week but states that deterioration in the right leg really only happened over the weekend 04/28/17; there is no open area on the left leg although there is an irritated part on the posterior which is like a wrap injury. The wound on the right leg which was new from last week at least to [place] is a lot better. 05/05/17; still no open area on the left leg. Patient is using his new compression stocking which seems to be doing a good job of controlling the edema. He states he is using his compression  pumps once per day. The right leg still has an open wound although it is better in terms of surface area. Required debridement. A lot of pain in the posterior right Achilles marked tenderness. Usually this type of presentation this patient gives concern for an active cellulitis 05/12/17; patient arrives today with his major wound from last week on the right lateral leg somewhat better. Still requiring debridement. He was using his compression stocking on the left leg however that is reopened with superficial wounds anteriorly he did not have an open wound on this leg previously. He is still using his juxta light's once daily at night. He cannot find the time to do this in the morning as he has to be at work by 7 AM 05/19/17; right lateral leg wound looks improved. No debridement required. The concerning area is on the left posterior leg which appears to almost have a subcutaneous hemorrhagic component to it. We've been using silver alginate to all the wounds 05/26/17; the right lateral leg wound continues to look improved. However the area on the left posterior calf is a tightly adherent surface. [place] using silver alginate. Because of the weeping edema in his legs there is very little good alternatives. 06/02/17; the patient left  here last week looking quite good. Major wound on the left posterior calf and a small one on the right lateral calf. Both of these look satisfactory. He tells me that by Wednesday he had noted increased pain in the left leg and drainage. He called on Thursday and Friday to get an appointment here but we were blocked. He did not go to urgent care or his primary physician. He thinks he had a fever on Thursday but did not actually take his temperature. He has not been using his compression pumps on the left leg because of pain. I advised him to go to the emergency room today for IV antibiotics for stents of left leg cellulitis but he has refused I have asked him to take 2 days off work to keep his leg elevated and he has refused this as well. In view of this I'm going to call him and Augmentin and doxycycline. 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 However after a detailed discussion regarding the risk benefits and alternatives he refuses to get admitted to the hospital. With no other recourse I will continue him on oral antibiotics as before and hopefully he'll have his infectious disease consultation this week. 06/16/2017 -- the patient was seen today by the nurse practitioner at infectious disease Ms. Dixon. 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 He has also been advise Lotrimin cream locally. she also had wise IV antibiotics for his condition if it flares up 06/23/17; patient arrives today with drainage bilaterally although the remaining wound on the left posterior calf after  cleaning up today "highlighter yellow drainage" did not look too bad. Unfortunately he has had breakdown on the right anterior leg [previously this leg had not been open and he is using a black stocking] he went to see infectious disease  and is been put on clindamycin 150 mg daily, I did not verify the dose although I'm not familiar with using clindamycin in this dosing range, perhaps for prophylaxisoo 06/27/17; I brought this patient back today to follow-up on the wound deterioration on the right lower leg together with surrounding cellulitis. I started him on doxycycline 4 days ago. This area looks better however he comes in today with intense cellulitis on the medial part of his left thigh. This is not have a wound in this area. Extremely tender. We've been using silver alginate to the wounds on the right lower leg left lower leg with bilateral 4 layer compression he is using his external compression pumps once a day 07/04/17; patient's left medial thigh cellulitis looks better. He has not been using his compression pumps as his insert said it was contraindicated with cellulitis. His right leg continues to make improvements all the wounds are still open. We only have one remaining wound on the left posterior calf. Using silver alginate to all open areas. He is on doxycycline which I started a week ago and should be finishing I gave him Augmentin after Thursday's visit for the severe cellulitis on the left medial thigh which fortunately looks better 07/14/17; the patient's left medial thigh cellulitis has resolved. The cellulitis in his right lower calf on the right also looks better. All of his wounds are stable to improved we've been using silver alginate he has completed the antibiotics I have given him. He has clindamycin 150 mg once a day prescribed by infectious disease for prophylaxis, I've advised him to start this now. We have been using bilateral Unna boots over silver alginate to the wound  areas 07/21/17; the patient is been to see infectious disease who noted his recurrent problems with cellulitis. He was not able to tolerate prophylactic clindamycin therefore he is on amoxicillin 500 twice a day. He also had a second daily dose of Lasix added By Dr. Oneta Rack but he is not taking this. Nor is he being completely compliant with his compression pumps a especially not this week. He has 2 remaining wounds one on the right posterior lateral lower leg and one on the left posterior medial lower leg. 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 The patient has Unna boots bilaterally. 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 He has bilateral Unna boots for compression and silver alginate to his wounds. Arrives today with his legs looking as good as I have seen him in quite some time. Not surprisingly his wounds look better as well with improvement on the right lateral leg venous insufficiency wound and also the left medial leg. He is still using the compression pumps once a day 08/11/17; both legs appear to be doing better wounds on the right lateral and left medial legs look better. Skin on the right leg quite good. He is been using silver alginate as the primary dressing. I'm going to use Anasept gel calcium alginate and maintain all the secondary dressings 08/18/17; the patient continues to actually do quite well. The area on his right lateral leg is just about closed the left medial also looks better although it is still moist in this area. His edema is well controlled we have been using Anasept gel with calcium alginate and  the usual secondary dressings, 4 layer compression and once daily use of his compression pumps "always been able to manage 09/01/17; the patient  continues to do reasonably well in spite of his trip to [place]. The area on the right lateral leg is epithelialized. Left is much better but still open. He has more edema and more chronic erythema on the left leg [venous inflammation] 09/08/17; he arrives today with no open wound on the right lateral leg and decently controlled edema. Unfortunately his left leg is not nearly as in his good situation as last week.he apparently had increasing edema starting on Saturday. He edema soaked through into his foot so used a plastic bag to walk around his home. The area on the medial right leg which was his open area is about the same however he has lost surface epithelium on the left lateral which is new and he has significant pain in the Achilles area of the left foot. He is already on amoxicillin chronically for prophylaxis of cellulitis in the left leg 09/15/17; he is completed a week of doxycycline and the cellulitis in the left posterior leg and Achilles area is as usual improved. He still has a lot of edema and fluid soaking through his dressings. There is no open wound on the right leg. He saw infectious disease NP today 09/22/17;As usual 1 we transition him from our compression wraps to his stockings things did not go well. He has several small open areas on the right leg. He states this was caused by the compression wrap on his skin although he did not wear this with the stockings over them. He has several superficial areas on the left leg medially laterally posteriorly. He does not have any evidence of active cellulitis especially involving the left Achilles The patient is traveling from [facility] Saturday going to [facility]. He states he isn't attempting to get an appointment with a heel objects wound center there to change his dressings. I am not completely certain whether this will work 10/06/17; the patient came in on Friday for a nurse visit and the nurse reported that his legs actually look  quite good. He arrives in clinic today for his regular follow-up visit. He has a new wound on his left third toe over the PIP probably caused by friction with his footwear. He has small areas on the left leg and a very superficial but epithelialized area on the right anterior lateral lower leg. Other than that his legs look as good as I've seen him in quite some time. We have been using silver alginate Review of systems; no chest pain no shortness of breath other than this a 10 point review of systems negative 10/20/17; seen by Dr. Meyer Russel last week. He had taken some antibiotics [doxycycline] that he had left over. Dr. Meyer Russel thought he had candida infection and declined to give him further antibiotics. He has a small wound remaining on the right lateral leg several areas on the left leg including a larger area on the left posterior several left medial and anterior and a small wound on the left lateral. The area on the left dorsal third toe looks a lot better. ROS; Gen.; no fever, respiratory no cough no sputum Cardiac no chest pain other than this 10 point review of system is negative 10/30/17; patient arrives today having fallen in the bathtub 3 days ago. It took him a while to get up. He has pain and maceration in the wounds on his left  leg which have deteriorated. He has not been using his pumps he also has some maceration on the right lateral leg. 11/03/17; patient continues to have weeping edema especially in the left leg. This saturates his dressings which were just put on on 12/27. As usual the doxycycline seems to take care of the cellulitis on his lower leg. He is not complaining of fever, chills, or other systemic symptoms. He states his leg feels a lot better on the doxycycline I gave him empirically. He also apparently gets injections at his primary doctor's officeo Rocephin for cellulitis prophylaxis. I didn't ask him about his compression pump compliance today I think that's probably  marginal. Arrives in the clinic with all of his dressings primary and secondary macerated full of fluid and he has bilateral edema 11/10/17; the patient's right leg looks some better although there is still a cluster of wounds on the right lateral. The left leg is inflamed with almost circumferential skin loss medially to laterally although we are still maintaining anteriorly. He does not have overt cellulitis there is a lot of drainage. He is not using compression pumps. We have been using silver alginate to the wound areas, there are not a lot of options here 11/17/17; the patient's right leg continues to be stable although there is still open wounds, better than last week. The inflammation in the left leg is better. Still loss of surface layer epithelium especially posteriorly. There is no overt cellulitis in the amount of edema and his left leg is really quite good, tells me he is using his compression pumps once a day. 11/24/17; patient's right leg has a small superficial wound laterally this continues to improve. The inflammation in the left leg is still improving however we have continuous surface layer epithelial loss posteriorly. There is no overt cellulitis in the amount of edema in both legs is really quite good. He states he is using his compression pumps on the left leg once a day for 5 out of 7 days 12/01/17; very small superficial areas on the right lateral leg continue to improve. Edema control in both legs is better today. He has continued loss of surface epithelialization and left posterior calf although I think this is better. We have been using silver alginate with large number of absorptive secondary dressings 4 layer on the left Unna boot on the right at his request. He tells me he is using his compression pumps once a day 12/08/17; he has no open area on the right leg is edema control is good here. On the left leg however he has marked erythema and tenderness breakdown of skin. He has  what appears to be a wrap injury just distal to the popliteal fossa. 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 There was swelling erythema and breakdown in the right leg. He has much worse skin breakdown in the left leg as well multiple open areas medially and posteriorly as well as laterally. He tells me he has been using his compression pumps but tells me he feels that the drainage out of his leg is worse when he uses a compression pumps. T be fair to him he is been saying this o for a while however I don't  know that I have really been listening to this. I wonder if the compression pumps are working properly 12/22/17;. Once again he arrives with severe erythema, weeping edema from the left greater than right leg. Noncompliance with compression pumps. New this visit he is complaining of pain on the lateral aspect of the right leg and the medial aspect of his right thigh. He apparently saw his cardiologist Dr. [name] who was ordered an echocardiogram area and I think this is a step in the right direction 12/25/17; started his doxycycline Monday night. There is still intense erythema of the right leg especially in the anterior thigh although there is less tenderness. The erythema around the wound on the right lateral calf also is less tender. He still complaining of pain in the left heel. His wounds are about the same right lateral left medial left lateral. Superficial but certainly not close to closure. He denies being systemically unwell no fever chills no abdominal pain no diarrhea 12/29/17; back in follow-up of his extensive right calf and right thigh cellulitis. I added amoxicillin to cover possible doxycycline resistant  strep. This seems to of done the trick he is in much less pain there is much less erythema and swelling. He has his echocardiogram at 11:00 this morning. X-ray of the left heel was also negative. 01/05/18; the patient arrived with his edema under much better control. Now that he is retired he is able to use his compression pumps daily and sometimes twice a day per the patient. He has a wound on the right leg the lateral wound looks better. Area on the left leg also looks a lot better. He has no evidence of cellulitis in his bilateral thighs I had a quick peak at his echocardiogram. He is in normal ejection fraction and normal left ventricular function. He has moderate pulmonary hypertension moderately reduced right ventricular function. One would have to wonder about chronic sleep apnea although he says he doesn't snore. He'll review the echocardiogram with his cardiologist. 01/12/18; the patient arrives with the edema in both legs under exemplary control. He is using his compression pumps daily and sometimes twice daily. His wound on the right lateral leg is just about closed. He still has some weeping areas on the posterior left calf and lateral left calf although everything is just about closed here as well. I have spoken with Aldean Baker who is the patient's nurse practitioner and infectious disease. She was concerned that the patient had not understood that the parenteral penicillin injections he was receiving for cellulitis prophylaxis was actually benefiting him. I don't think the patient actually saw that I would tend to agree we were certainly dealing with less infections although he had a serious one last month. 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 He is healed. He'll be placed in juxtalite compression wraps and increase his lymphedema pumps to twice daily. We will follow up again next week to ensure there are no issues with the new  regiment. 01/20/18-he is here for evaluation of bilateral lower extremity weeping edema. Yesterday he was placed in compression wrap to the right lower extremity and compression stocking to left lower shrubbery. He states he uses lymphedema pumps last night and again this morning and noted a blister to the left lower extremity. On exam he was noted to have drainage to the right lower extremity. He will be placed in Unna boots bilaterally and follow-up next week 01/26/18; patient was actually discharged a week  ago to his own juxta light stockings only to return the next day with bilateral lower extremity weeping edema.he was placed in bilateral Unna boots. He arrives today with pain in the back of his left leg. There is no open area on the right leg however there is a linear/wrap injury on the left leg and weeping edema on the left leg posteriorly. I spoke with infectious disease about 10 days ago. They were disappointed that the patient elected to discontinue prophylactic intramuscular penicillin shots as they felt it was particularly beneficial in reducing the frequency of his cellulitis. I discussed this with the patient today. He does not share this view. He'll definitely need antibiotics today. Finally [place] and returning a week later and he does not travel with his pumps. He is going by car 01/30/18; patient was seen 4 days ago and brought back in today for review of cellulitis in the left leg posteriorly. I put him on amoxicillin this really hasn't helped as much as I might like. He is also worried because [place]. Finally we will be rewrapping him. There is no open area on the right leg over his left leg has multiple weeping areas as usual 02/09/18; The same wrap on for 10 days. He did not pick up the last doxycycline I prescribed for him. He apparently took 4 days worth he already had. There is nothing open on  his right leg and the edema control is really quite good. He's had damage in the left leg medially and laterally especially probably related to the prolonged use of Unna boots 02/12/18; the patient arrived in clinic today for a nurse visit/wrap change. He complained of a lot of pain in the left posterior calf. He is taking doxycycline that I previously prescribed for him. Unfortunately even though he used his stockings and apparently used to compression pumps twice a day he has weeping edema coming out of the lateral part of his right leg. This is coming from the lower anterior lateral skin area. 02/16/18; the patient has finished his doxycycline and will finish the amoxicillin 2 days. The area of cellulitis in the left calf posteriorly has resolved. He is no longer having any pain. He tells me he is using his compression pumps at least once a day sometimes twice. 02/23/18; the patient finished his doxycycline and Amoxil last week. On Friday he noticed a small erythematous circle about the size of a quarter on the left lower leg just above his ankle. This rapidly expanded and he now has erythema on the lateral and posterior part of the thigh. This is bright red. Also has an area on the dorsal foot just above his toes and a tender area just below the left popliteal fossa. He came off his prophylactic penicillin injections at his own insistence one or 2 months ago. This is obviously deteriorated since then 03/02/18; patient is on doxycycline and Amoxil. Culture I did last week of the weeping area on the back of his left calf grew group B strep. I have therefore renewed the amoxicillin 500 3 times a day for a further week. He has not been systemically unwell. Still complaining of an area of discomfort right under his left popliteal fossa. There is no open wound on the right leg. He tells me that he is using his pumps twice a day on most days 03/09/18; patient arrives in clinic today completing his  amoxicillin  today. The cellulitis on his left leg is better. Furthermore he tells me that he had intramuscular penicillin shots that his primary care office today. However he also states that the wrap on his right leg fell down shortly after leaving clinic last week. He developed a large blister that was present when he came in for a nurse visit later in the week and then he developed intense discomfort around this area.He tells me he is using his compression pumps 03/16/18; the patient has completed his doxycycline. The infectious part of this/cellulitis in the left heel area left popliteal area is a lot better. He has 2 open areas on the right calf. Still areas on the left calf but this is a lot better as well. 03/24/18; the patient arrives complaining of pain in the left popliteal area again. He thinks some of this is wrap injury. He has no open area on the right leg and really no open area on the left calf either except for the popliteal area. He claims to be compliant with the compression pumps 03/31/18; I gave him doxycycline last week because of cellulitis in the left popliteal area. This is a lot better although the surface epithelium is denuded off and response to this. He arrives today with uncontrolled edema in the right calf area as well as a fingernail injury in the right lateral calf. There is only a few open areas on the left 04/06/18; I gave him amoxicillin doxycycline over the last 2 weeks that the amoxicillin should be completing currently. He is not complaining of any pain or systemic symptoms. The only open areas see has is on the right lateral lower leg paradoxically I cannot see anything on the left lower leg. He tells me he is using his compression pumps twice a day on most days. Silver alginate to the wounds that are open under 4 layer compression 04/13/18; he completed antibiotics and has no new complaints. Using his compression pumps. Silver alginate that anything that's opened 04/20/18; he is  using his compression pumps religiously. Silver alginate 4 layer compression anything that's opened. He comes in today with no open wounds on the left leg but 3 on the right including a new one posteriorly. He has 2 on the right lateral and one on the right posterior. He likes Unna boots on the right leg for reasons that aren't really clear we had the usual 4 layer compression on the left. It may be necessary to move to the 4 layer compression on the right however for now I left them in the Unna boots 04/27/18; he is using his compression pumps at least once a day. He has still the wounds on the right lateral calf. The area right posteriorly has closed. He does not have an open wound on the left under 4 layer compression however on the dorsal left foot just proximal to the toes and the left third toe 2 small open areas were identified 05/11/18; he has not uses compression pumps. The areas on the right lateral calf have coalesced into one large wound necrotic surface. On the left side he has one small wound anteriorly however the edema is now weeping out of a large part of his left leg. He says he wasn't using his pumps because of the weeping fluid. I explained to him that this is the time he needs to pump more 05/18/18; patient states he is using his compression pumps twice a day. The area on the right lateral large  wound albeit superficial. On the left side he has innumerable number of small new wounds on the left calf particularly laterally but several anteriorly and medially. All these appear to have healthy granulated base these look like the remnants of blisters however they occurred under compression. The patient arrives in clinic today with his legs somewhat better. There is certainly less edema, less multiple open areas on the left calf and the right anterior leg looks somewhat better as well superficial and a little smaller. However he relates pain and erythema over the last 3-4 days in the thigh  and I looked at this today. He has not been systemically unwell no fever no chills no change in blood sugar values 05/25/18; comes in today in a better state. The severe cellulitis on his left leg seems better with the Keflex. Not as tender. He has not been systemically unwell Hard to find an open wound on the left lower leg using his compression pumps twice a day The confluent wounds on his right lateral calf somewhat better looking. These will ultimately need debridement I didn't do this today. 06/01/18; the severe cellulitis on the left anterior thigh has resolved and he is completed his Keflex. There is no open wound on the left leg however there is a superficial excoriation at the base of the third toe dorsally. Skin on the bottom of his left foot is macerated looking. The left the wounds on the lateral right leg actually looks some better although he did require debridement of the top half of this wound area with an open curet 06/09/18 on evaluation today patient appears to be doing poorly in regard to his right lower extremity in particular this appears to likely be infected he has very thick purulent discharge along with a bright green tent to the discharge. This makes me concerned about the possibility of pseudomonas. He's also having increased discomfort at this point on evaluation. Fortunately there does not appear to be any evidence of infection spreading to the other location at this time. 06/16/18 on evaluation today patient appears to actually be doing fairly well. His ulcer has actually diminished in size quite significantly at this point which is good news. Nonetheless he still does have some evidence of infection he did see infectious disease this morning before coming here for his appointment. I did review the results of their evaluation and their note today. They did actually have him discontinue the Cipro and initiate treatment with linezolid at this time. He is doing this for the  next seven days and they recommended a follow-up in four months with them. He is the keep a log of the need for intermittent antibiotic therapy between now and when he falls back up with infectious disease. This will help them gaze what exactly they need to do to try and help them out. 06/23/18; the patient arrives today with no open wounds on the left leg and left third toe healed. He is been using his compression pumps twice a day. On the right lateral leg he still has a sizable wound but this is a lot better than last time I saw this. 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 Has been using silver alginate under 4 layer compression 06/30/18; the only open wound he has is on the right lateral leg and this looks healthy. No debridement is required. We have been using silver alginate.  He does not have an open wound on the left leg. There is apparently some drainage from the dorsal proximal third toe on the left although I see no open wound here. 07/03/18 on evaluation today patient was actually here just for a nurse visit rapid change. However when he was here on Wednesday for his rat change due to having been healed on the left and then developing blisters we initiated the wrap again knowing that he would be back today for [place] to reevaluate and see were at. Unfortunately he has developed some cellulitis into the proximal portion of his right lower extremity even into the region of his thigh. 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 He was placed on one as what at that point. Nonetheless he is done with that and has been tolerating it well otherwise. Doxycycline which in the past really did not seem to be effective for him. Nonetheless I think the best option may be for [place] to definitely reinitiate the antibiotics for a longer period of time. 07/07/18; since I  last saw this patient a week ago he has had a difficult time. At that point he did not have an open wound on his left leg. We transitioned him into juxta light stockings. He was apparently in the clinic the next day with blisters on the left lateral and left medial lower calf. He also had weeping edema fluid. He was put back into a compression wrap. He was also in the clinic on Friday with intense erythema in his right thigh. Per the patient he was started on Bactrim however that didn't work at all in terms of relieving his pain and swelling. He has taken 3 doxycycline that he had left over from last time and that seems to of helped. He has blistering on the right thigh as well. 07/14/18; the erythema on his right thigh has gotten better with doxycycline that he is finishing. 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 Presumably this cellulitis in the thigh was not related to that although I think this is a bit concerning going forward. He still has an area on the right lateral calf the blister on the right medial calf just below the knee that was discussed above. On the left 2 small open areas left medial and left lateral. Edema control is adequate. He is using his compression pumps twice a day 07/20/18; continued improvement in the condition of both legs especially the edema in his bilateral thighs. He tells me he is been losing weight through a combination of diet and exercise. He is using his compression pumps twice a day. So overall she made to the remaining wounds 07/27/2018; continued improvement in condition of both legs. His edema is well controlled. The area on the right lateral leg is just about closed he had one blisters show up on the medial left upper calf. We have him in 4 layer compression. He is going on a 10-day trip to [place]. He will be driving. He wants to wear Unna boots because of  the lessening amount of constriction. He will not use compression pumps while he is away 08/05/18 on evaluation today patient actually appears to be doing decently well all things considered in regard to his bilateral lower extremities. The worst ulcer is actually only posterior aspect of his left lower extremity  with a four layer compression wrap cut into his leg a couple weeks back. He did have a trip and actually had [employer] for the trip that he is worn since he was last here. Nonetheless he feels like the [employer] actually do better for him his swelling is up a little bit but he also with his trip was not taking his Lasix on a regular set schedule like he was supposed to be. He states that obviously the reason being that he cannot drive and keep going without having to urinate too frequently which makes it difficult. He did not have his pumps with him while he was away either which I think also maybe playing a role here too. 08/13/2018; the patient only has a small open wound on the right lateral calf which is a big improvement in the last month or 2. He also has the area posteriorly just below the posterior fossa on the left which I think was a wrap injury from several weeks ago. He has no current evidence of cellulitis. He tells me he is back into his compression pumps twice a day. He also tells me that while he was at the laundromat somebody stole a section of his extremitease stockings 08/20/2018; back in the clinic with a much improved state. He only has small areas on the right lateral mid calf which is just about healed. This was is more substantial area for quite a prolonged period of time. He has a small open area on the left anterior tibia. The area on the posterior calf just below the popliteal fossa is closed today. He is using his compression pumps twice a day 08/28/2018; patient has no open wound on the right leg. He has a smattering of open areas on the calf with some  weeping lymphedema. More problematically than that it looks as though his wraps of slipped down in his usual he has very angry upper area of edema just below the right medial knee and on the right lateral calf. He has no open area on his feet. The patient is traveling to [facility] next week. I will send him in an antibiotic. We will continue to wrap the right leg. We ordered extremitease stockings for him last week and I plan to transition the right leg to a stocking when he gets home which will be in 10 days time. As usual he is very reluctant to take his pumps with him when he travels 09/07/2018; patient returns from [facility]. He shows me a picture of his left leg in the mid part of his trip last week with intense fire engine erythema. The picture look bad enough I would have considered sending him to the hospital. Instead he went to the wound care center in [name]. They did not prescribe him antibiotics but he did take some doxycycline he had leftover from a previous visit. I had given him trimethoprim sulfamethoxazole before he left this did not work according to the patient. This is resulted in some improvement fortunately. He comes back with a large wound on the left posterior calf. Smaller area on the left anterior tibia. Denuded blisters on the dorsal left foot over his toes. Does not have much in the way of wounds on the right leg although he does have a very tender area on the right posterior area just below the popliteal fossa also suggestive of infection. He promises me he is back on his pumps twice a day  09/15/2018; the intense cellulitis in his left lower calf is a lot better. The wound area on the posterior left calf is also so better. However he has reasonably extensive wounds on the dorsal aspect of his second and third toes and the proximal foot just at the base of the toes. There is nothing open on the right leg 09/22/2018; the patient has excellent edema  control in his legs bilaterally. He is using his external compression pumps twice a day. He has no open area on the right leg and only the areas in the left foot dorsally second and third toe area on the left side. He does not have any signs of active cellulitis. 10/06/2018; the patient has good edema control bilaterally. He has no open wound on the right leg. There is a blister in the posterior aspect of his left calf that we had to deal with today. He is using his compression pumps twice a day. There is no signs of active cellulitis. We have been using silver alginate to the wound areas. He still has vulnerable areas on the base of his left first second toes dorsally He has a his extremities stockings and we are going to transition him today into the stocking on the right leg. He is cautioned that he will need to continue to use the compression pumps twice a day. If he notices uncontrolled edema in the right leg he may need to go to 3 times a day. 10/13/2018; the patient came in for a nurse check on Friday he has a large flaccid blister on the right medial calf just below the knee. We unroofed this. He has this and a new area underneath the posterior mid calf which was undoubtedly a blister as well. He also has several small areas on the right which is the area we put his extremities stocking on. 10/19/2018; the patient went to see infectious disease this morning I am not sure if that was a routine follow-up in any case the doxycycline I had given him was discontinued and started on linezolid. He has not started this. It is easy to look at his left calf and the inflammation and think this is cellulitis however he is very tender in the tissue just below the popliteal fossa and I have no doubt that there is infection going on here. He states the problem he is having is that with the compression pumps the edema goes down and then starts walking the wrap falls down. We will see if we can adhere this. He  has 1 or 2 minuscule open areas on the right still areas that are weeping on the posterior left calf, the base of his left second and third toes 10/26/18; back today in clinic with quite of skin breakdown in his left anterior leg. This may have been infection the area below the popliteal fossa seems a lot better however tremendous epithelial loss on the left anterior mid tibia area over quite inexpensive tissue. He has 2 blisters on the right side but no other open wound here. 10/29/2018; came in urgently to see [place] today and we worked him in for review. He states that the 4 layer compression on the right leg caused pain he had to cut it down to roughly his mid calf this caused swelling above the wrap and he has blisters and skin breakdown today. As a result of the pain he has not been using his pumps. Both legs are a lot more edematous and there is  a lot of weeping fluid. 11/02/18; arrives in clinic with continued difficulties in the right leg> left. Leg is swollen and painful. multiple skin blisters and new open areas especially laterally. He has not been using his pumps on the right leg. 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 He is not systemically unwell. 11/09/2018; the patient claims he is being compliant with his pumps. He is finished the doxycycline I gave him last week. Culture I did of the wound on the right lateral leg showed a few very resistant methicillin staph aureus. This was resistant to doxycycline. Nevertheless he states the pain in the leg is a lot better which makes me wonder if the cultured organism was not really what was causing the problem nevertheless this is a very dangerous organism to be culturing out of any wound. His right leg is still a lot larger than the left. He is using an Radio broadcast assistant on this area, he blames a 4-layer compression for causing the original skin breakdown which I doubt is true however I cannot talk him out  of it. We have been using silver alginate to all of these areas which were initially blisters 11/16/2018; patient is being compliant with his external compression pumps at twice a day. Miraculously he arrives in clinic today with absolutely no open wounds. He has better edema control on the left where he has been using 4 layer compression versus wound of wounds on the right and I pointed this out to him. There is no inflammation in the skin in his lower legs which is also somewhat unusual for him. There is no open wounds on the dorsal left foot. He has extremitease stockings at home and I have asked him to bring these in next week. 11/25/18 patient's lower extremity on examination today on the left appears for the most part to be wound free. He does have an open wound on the lateral aspect of the right lower extremity but this is minimal compared to what I've seen in past. He does request that we go ahead and wrap the left leg as well even though there's nothing open just so hopefully it will not reopen in short order. 1/28; patient has superficial open wounds on the right lateral calf left anterior calf and left posterior calf. His edema control is adequate. 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 We have been using silver alginate as the primary dressing. He claims compliance with his compression pumps 2/4; patient has superficial open wounds on numerous areas of his left calf and again one on the left dorsal foot. The areas on the right lateral calf have healed. The cellulitis that I gave him doxycycline for last week is also resolved this was mostly on the left anterior calf just below the tibial tuberosity. His edema looks fairly well-controlled. He tells me he went to see his primary doctor today and had blood work ordered 2/11; once again he has several open areas on the left calf left tibial area. Most of  these are small and appear to have healthy granulation. He does not have anything open on the right. The edema and control in his thighs is pretty good which is usually a good indication he has been using his pumps as requested. 2/18; he continues to have several small areas on the left calf and left tibial area. Most of these  are small healthy granulation. We put him in his stocking on the right leg last week and he arrives with a superficial open area over the right upper tibia and a fairly large area on the right lateral tibia in similar condition. His edema control actually does not look too bad, he claims to be using his compression pumps twice a day 2/25. Continued small areas on the left calf and left tibial area. New areas especially on the right are identified just below the tibial tuberosity and on the right upper tibia itself. There are also areas of weeping edema fluid even without an obvious wound. He does not have a considerable degree of lymphedema but clearly there is more edema here than his skin can handle. He states he is using the pumps twice a day. We have an Unna boot on the right and 4 layer compression on the left. 3/3; he continues to have an area on the right lateral calf and right posterior calf just below the popliteal fossa. There is a fair amount of tenderness around the wound on the popliteal fossa but I did not see any evidence of cellulitis, could just be that the wrap came down and rubbed in this area. He does not have an open area on the left leg however there is an area on the left dorsal foot at the base of the third toe We have been using silver alginate to all wound areas 3/10; he did not have an open area on his left leg last time he was here a week ago. T oday he arrives with a horizontal wound just below the tibial tuberosity and an area on the left lateral calf. He has intense erythema and tenderness in this area. The area is on the right lateral calf and  right posterior calf better than last week. We have been using silver alginate as usual 3/18 - Patient returns with 3 small open areas on left calf, and 1 small open area on right calf, the skin looks ok with no significant erythema, he continues the UNA boot on right and 4 layer compression on left. The right lateral calf wound is closed , the right posterior is small area. we will continue silver alginate to the areas. 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 He is been tolerating the dressing changes without complication. Fortunately she has made excellent improvement in regard to the overall status of his wounds. Unfortunately every time we cease wrapping him he ends up reopening in causing more significant issues at that point. Again I'm unsure of the best direction to take although I think the lymphedema clinic may be appropriate for him. 02/03/19 on evaluation today patient appears to be doing well in regard to the wounds that we saw him for last week unfortunately he has a new area on the proximal portion of his right medial/posterior lower extremity where the wrap somewhat slowed down and caused swelling and a blister to rub and open. Unfortunately this is the only opening that he has on either leg at this point. 02/17/19 on evaluation today patient's bilateral lower extremities appear to be doing well. He still completely healed in regard to the left lower extremity. In regard to the right lower extremity the area where the wrap and slid down and caused the blister  still seems to be slightly open although this is dramatically better than during the last evaluation two weeks ago. I'm very pleased with the way this stands overall. 03/03/19 on evaluation today patient appears to be doing well in regard  to his right lower extremity in general although he did have a new blister open this does not appear to be showing any evidence of active infection at this time. Fortunately there's No fevers, chills, nausea, or vomiting noted at this time. Overall I feel like he is making good progress it does feel like that the right leg will we perform the [employer] seems to do with a bit better than three layer wrap on the left which slid down on him. We may switch to doing bilateral in the book wraps. 5/4; I have not seen Mr. Pareja in quite some time. According to our case manager he did not have an open wound on his left leg last week. He had 1 remaining wound on the right posterior medial calf. He arrives today with multiple openings on the left leg probably were blisters and/or wrap injuries from Unna boots. I do not think the Unna boot's will provide adequate compression on the left. I am also not clear about the frequency he is using the compression pumps. 03/17/19 on evaluation today patient appears to be doing excellent in regard to his lower extremities compared to last week's evaluation apparently. He had gotten significantly worse last week which is unfortunate. The [employer] wrap on the left did not seem to do very well for him at all and in fact it didn't control his swelling significantly enough he had an additional outbreak. Subsequently we go back to the four layer compression wrap on the left. This is good news. At least in that he is doing better and the wound seem to be killing him. He still has not heard anything from the lymphedema clinic. 03/24/19 on evaluation today patient actually appears to be doing much better in regard to his bilateral lower Trinity as compared to last week when I saw him. Fortunately there's no signs of active infection at this time. He has been tolerating the dressing changes without complication. Overall I'm extremely pleased with the progress and appearance in  general. 04/07/19 on evaluation today patient appears to be doing well in regard to his bilateral lower extremities. His swelling is significantly down from where it was previous. With that being said he does have a couple blisters still open at this point but fortunately nothing that seems to be too severe and again the majority of the larger openings has healed at this time. 04/14/19 on evaluation today patient actually appears to be doing quite well in regard to his bilateral lower extremities in fact I'm not even sure there's anything significantly open at this time at any site. Nonetheless he did have some trouble with these wraps where they are somewhat irritating him secondary to the fact that he has noted that the graph wasn't too close down to the end of this foot in a little bit short as well up to his knee. Otherwise things seem to be doing quite well. 04/21/19 upon evaluation today patient's wound bed actually showed evidence of being completely healed in regard to both lower extremities which is excellent news. There does not appear to be any signs of active infection which is also good news. I'm very pleased in this regard. No fevers, chills, nausea, or vomiting noted at this time. 04/28/19  on evaluation today patient appears to be doing a little bit worse in regard to both lower extremities on the left mainly due to the fact that when he went infection disease the wrap was not wrapped quite high enough he developed a blister above this. On the right he is a small open area of nothing too significant but again this is continuing to give him some trouble he has been were in the Velcro compression that he has at home. 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 He's been tolerating the dressing changes without complication. Fortunately there's no signs of active infection at this time. No fevers, chills, nausea, or vomiting noted at  this time. We have been trying to get an appointment with her lymphedema clinic in [name] but unfortunately nobody can get them on phone with not been able to even fax information over the patient likewise is not been able to get in touch with them. Overall I'm not sure exactly what's going on here with to reach out again today. 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 Still having a lot of drainage unfortunately. He tells me especially in the left but even on the right. There's no signs of active infection which is good news we've been using so ratcheted up to this point. 05/19/19 on evaluation today patient actually appears to be doing quite well with regard to his left lower extremity which is great news. Fortunately in regard to the right lower extremity has an issues with his wrap and he subsequently did remove this from what I'm understanding. Nonetheless long story short is what he had rewrapped once he removed it subsequently had maggots underneath this wrap whenever he came in for evaluation today. With that being said they were obviously completely cleaned away by the nursing staff. The visit today which is excellent news. However he does appear to potentially have some infection around the right ankle region where the maggots were located as well. He will likely require anabiotic therapy today. 05/26/19 on evaluation today patient actually appears to be doing much better in regard to his bilateral lower extremities. I feel like the infection is under much better control. With that being said there were maggots noted when the wrap was removed yet again today. Again this could have potentially been left over from previous although at this time there does not appear to be any signs of significant drainage there was obviously on the wrap some drainage as well this contracted gnats or  otherwise. Either way I do not see anything that appears to be doing worse in my pinion and in fact I think his drainage has slowed down quite significantly likely mainly due to the fact to his infection being under better control. 06/02/2019 on evaluation today patient actually appears to be doing well with regard to his bilateral lower extremities there is no signs of active infection at this time which is great news. With that being said he does have several open areas more so on the right than the left but nonetheless these are all significantly better than previously noted. 06/09/2019 on evaluation today patient actually appears to be doing well. His wrap stayed up and he did not cause any problems he had more drainage on the right compared to the left but overall I do not see any major issues at  this time which is great news. 06/16/2019 on evaluation today patient appears to be doing excellent with regard to his lower extremities the only area that is open is a new blister that can have opened as of today on the medial ankle on the left. Other than this he really seems to be doing great I see no major issues at this point. 06/23/2019 on evaluation today patient appears to be doing quite well with regard to his bilateral lower extremities. In fact he actually appears to be almost completely healed there is a small area of weeping noted of the right lower extremity just above the ankle. Nonetheless fortunately there is no signs of active infection at this time which is good news. No fevers, chills, nausea, vomiting, or diarrhea. 8/24; the patient arrived for a nurse visit today but complained of very significant pain in the left leg and therefore I was asked to look at this. Noted that he did not have an open area on the left leg last week nevertheless this was wrapped. The patient states that he is not been able to put his compression pumps on the left leg because of the discomfort. He has not been  systemically unwell 06/30/2019 on evaluation today patient unfortunately despite being excellent last week is doing much worse with regard to his left lower extremity today. In fact he had to come in for a nurse on Monday where his left leg had to be rewrapped due to excessive weeping Dr. Leanord Hawking placed him on doxycycline at that point. Fortunately there is no signs of active infection Systemically at this time which is good news. 07/07/2019 in regard to the patient's wounds today he actually seems to be doing well with his right lower extremity there really is nothing open or draining at this point this is great news. Unfortunately the left lower extremity is given him additional trouble at this time. There does not appear to be any signs of active infection nonetheless he does have a lot of edema and swelling noted at this point as well as blistering all of which has led to a much more poor appearing leg at this time compared to where it was 2 weeks ago when it was almost completely healed. Obviously this is a little discouraging for the patient. He is try to contact the lymphedema clinic in [place] he has not been able to get through to them. 07/14/2019 on evaluation today patient actually appears to be doing slightly better with regard to his left lower extremity ulcers. Overall I do feel like at least at the top of the wrap that we have been placing this area has healed quite nicely and looks much better. The remainder of the leg is showing signs of improvement. Unfortunately in the thigh area he still has an open region on the left and again on the right he has been utilizing just a Band-Aid on an area that also opened on the thigh. Again this is an area that were not able to wrap although we did do an Ace wrap to provide some compression that something that obviously is a little less effective than the compression wraps we have been using on the lower portion of the leg. He does have an  appointment with the lymphedema clinic in [facility] on Friday. 07/21/2019 on evaluation today patient appears to be doing better with regard to his lower extremity ulcers. He has been tolerating the dressing changes without complication. Fortunately there is no signs of active  infection at this time. No fevers, chills, nausea, vomiting, or diarrhea. I did receive the paperwork from the physical therapist at the lymphedema clinic in [place]. Subsequently I signed off on that this morning and sent that back to him for further progression with the treatment plan. 07/28/2019 on evaluation today patient appears to be doing very well with regard to his right lower extremity where I do not see any open wounds at this point. Fortunately he is feeling great as far as that is concerned as well. In regard to the left lower extremity he has been having issues with still several areas of weeping and edema although the upper leg is doing better his lower leg still I think is going require the compression wrap at this time. No fevers, chills, nausea, vomiting, or diarrhea. 08/04/2019 on evaluation today patient unfortunately is having new wounds on the right lower extremity. Again we have been using Unna boot wrap on that side. We switched him to using his juxta lite wrap at home. With that being said he tells me he has been using it although his legs extremely swollen and to be honest really does not appear that he has been. I cannot know that for sure however. Nonetheless he has multiple new wounds on the right lower extremity at this time. Obviously we will have to see about getting this rewrapped for him today. 08/11/2019 on evaluation today patient appears to be doing fairly well with regard to his wounds. He has been tolerating the dressing changes including the compression wraps without complication. He still has a lot of edema in his upper thigh regions bilaterally he is supposed to be seeing the  lymphedema clinic on the 15th of this month once his wraps arrive for the upper part of his legs. 08/18/2019 on evaluation today patient appears to be doing well with regard to his bilateral lower extremities at this point. He has been tolerating the dressing changes without complication. Fortunately there is no signs of active infection which is also good news. He does have a couple weeping areas on the first and second toe of the right foot he also has just a small area on the left foot upper leg and a small area on the left lower leg but overall he is doing quite well in my opinion. He is supposed to be getting his wraps shortly in fact tomorrow and then subsequently is seeing the lymphedema clinic next Wednesday on the 21st. Of note he is also leaving on the 25th to go on vacation for a week to the beach. For that reason and since there is some uncertainty about what there can be doing at lymphedema clinic next Wednesday I am get a make an appointment for next Friday here for [place] to see what we need to do for him prior to him leaving for vacation. 10/23; patient arrives in considerable pain predominantly in the upper posterior calf just distal to the popliteal fossa also in the wound anteriorly above the major wound. This is probably cellulitis and he has had this recurrently in the past. He has no open wound on the right side and he has had an Radio broadcast assistant in that area. Finally I note that he has an area on the left posterior calf which by enlarge is mostly epithelialized. This protrudes beyond the borders of the surrounding skin in the setting of dry scaly skin and lymphedema. The patient is leaving for [facility] on Sunday. Per his longstanding pattern, he  will not take his compression pumps with him predominantly out of fear that they will be stolen. He therefore asked that we put a Unna boot back on the right leg. He will also [place] to see if they can  change his dressing in the mid week. 11/3; [place] He was seen on 1 occasion at their wound care center. They did a 2 layer compression system as they did not have our 4-layer wrap. I am not completely certain what they put on the wounds. They did not change the Unna boot on the right. The patient is also seeing a lymphedema specialist [place]. It appears that he has some compression sleeve for his thighs which indeed look quite a bit better than I am used to seeing. He pumps over these with his external compression pumps. 11/10; the patient has a new wound on the right medial thigh otherwise there is no open areas on the right. He has an area on the left leg posteriorly anteriorly and medially and an area over the left second toe. We have been using silver alginate. He thinks the injury on his thigh is secondary to friction from the compression sleeve he has. 11/17; the patient has a new wound on the right medial thigh last week. He thinks this is because he did not have a underlying stocking for his thigh juxta lite apparatus. He now has this. The area is fairly large and somewhat angry but I do not think he has underlying cellulitis. He has a intact blister on the right anterior tibial area. Small wound on the right great toe dorsally Small area on the medial left calf. 11/30; the patient does not have any open areas on his right leg and we did not take his juxta lite stocking off. However he states that on Friday his compression wrap fell down lodging around his upper mid calf area. As usual this creates a lot of problems for him. He called urgently today to be seen for a nurse visit however the nurse visit turned into a provider visit because of extreme erythema and pain in the left anterior tibia extending laterally and posteriorly. The area that is problematic is extensive 10/06/2019 upon evaluation today patient actually  appears to be doing poorly in regard to his left lower extremity. He Dr. Leanord Hawkingobson did place him on doxycycline this past Monday apparently due to the fact that he was doing much worse in regard to this left leg. Fortunately the doxycycline does seem to be helping. Unfortunately we are still having a very difficult time getting his edema under any type of control in order to anticipate discharge at some point. The only way were really able to control his lymphedema really is with compression wraps and that has only even seemingly temporary. He has been seeing a lymphedema clinic they are trying to help in this regard but still this has been somewhat frustrating in general for the patient. 10/13/19 on evaluation today patient appears to be doing excellent with regard to his right lower extremity as far as the wounds are concerned. His swelling is still quite extensive unfortunately. He is still having a lot of drainage from the thigh areas bilaterally which is unfortunate. He's been going to lymphedema clinic but again he still really does not have this edema under control as far as his lower extremities are concern. With regard to his left lower  extremity this seems to be improving and I do believe the doxycycline has been of benefit for him. He is about to complete the doxycycline. 10/20/2019 on evaluation today patient appears to be doing poorly in regard to his bilateral lower extremities. More in the right thigh he has a lot of irritation at this site unfortunately. In regard to the left lower extremity the wrap was not quite as high it appears and does seem to have caused him some trouble as well. Fortunately there is no evidence of systemic infection though he does have some blue-green drainage which has me concerned for the possibility of Pseudomonas. He tells me he is previously taking Cipro without complications and he really does not care for Levaquin however due to some of the side effects he  has. He is not allergic to any medications specifically antibiotics that were aware of. 10/27/2019 on evaluation today patient actually does appear to be for the most part doing better when compared to last week's evaluation. With that being said he still has multiple open wounds over the bilateral lower extremities. He actually forgot to start taking the Cipro and states that he still has the whole bottle. He does have several new blisters on left lower extremity today I think I would recommend he go ahead and take the Cipro based on what I am seeing at this point. 12/30-Patient comes at 1 week visit, 4 layer compression wraps on the left and Unna boot on the right, primary dressing Xtrasorb and silver alginate. Patient is taking his Cipro and has a few more days left probably 5-6, and the legs are doing better. He states he is using his compressions devices which I believe he has 11/10/2019 on evaluation today patient actually appears to be much better than last time I saw him 2 weeks ago. His wounds are significantly improved and overall I am very pleased in this regard. Fortunately there is no signs of active infection at this time. He is just a couple of days away from completing Cipro. Overall his edema is much better he has been using his lymphedema pumps which I think is also helping at this point. 11/17/2019 on evaluation today patient appears to be doing excellent in regard to his wounds in general. His legs are swollen but not nearly as much as they have been in the past. Fortunately he is tolerating the compression wraps without complication. No fevers, chills, nausea, vomiting, or diarrhea. He does have some erythema however in the distal portion of his right lower extremity specifically around the forefoot and toes there is a little bit of warmth here as well. 11/24/2019 on evaluation today patient appears to be doing well with regard to his right lower extremity I really do not see any open  wounds at this point. His left lower extremity does have several open areas and his right medial thigh also is open. Other than this however overall the patient seems to be making good progress and I am very pleased at this point. 12/01/2019 on evaluation today patient appears to be doing poorly at this point in regard to his left lower extremity has several new blisters despite the fact that we have him in compression wraps. In fact he had a 4-layer compression wrap, his upper thigh wrapped from lymphedema clinic, and a juxta light over top of the 4 layer compression wrap the lymphedema clinic applied and despite all this he still develop blisters underneath. Obviously this does have me concerned about  the fact that unfortunately despite what we are doing to try to get wounds healed he continues to have new areas arise I do not think he is ever good to be at the point where he can realistically just use wraps at home to keep things under control. Typically when we heal him it takes about 1-2 days before he is back in the clinic with severe breakdown and blistering of his lower extremities bilaterally. This is happened numerous times in the past. Unfortunately I think that we may need some help as far as overall fluid overload to kind of limit what we are seeing and get things under better control. 12/08/2019 on evaluation today patient presents for follow-up concerning his ongoing bilateral lower extremity edema. Unfortunately he is still having quite a bit of swelling the compression wraps are controlling this to some degree but he did see Dr. [name] his cardiologist I do have that available for review today as far as the appointment was concerned that was on 12/06/2019. Obviously that she has been 2 days ago. The patient states that he is only been taking the Lasix 80 mg 1 time a day he had told me previously he was taking this twice a day. Nonetheless Dr. [name] recommended this be up to 80 mg 2 times a  day for the patient as he did appear to be fluid overloaded. With that being said the patient states he did this yesterday and he was unable to go anywhere or do anything due to the fact that he was constantly having to urinate. Nonetheless I think that this is still good to be something that is important for him as far as trying to get his edema under control at all things that he is going to be able to just expect his wounds to get under control and things to be better without going through at least a period of time where he is trying to stabilize his fluid management in general and I think increasing the Lasix is likely the first step here. It was also mentioned the possibility that the patient may require metolazone. With that being said he wanted to have the patient take Lasix twice a day first and then reevaluating 2 months to see where things stand. 12/15/2019 upon evaluation today patient appears to be doing regard to his legs although his toes are showing some signs of weeping especially on the left at this point to some degree on the right. There does not appear to be any signs of active infection and overall I do feel like the compression wraps are doing well for him but he has not been able to take the Lasix at home and the increased dose that Dr. [name] recommended. He tells me that just not go to be feasible for him. Nonetheless I think in this case he should probably send a message to Dr. [name] in order to discuss options from the standpoint of possible admission to get the fluid off or otherwise going forward. 12/22/2019 upon evaluation today patient appears to be doing fairly well with regard to his lower extremities at this point. In fact he would be doing excellent if it was not for the fact that his right anterior thigh apparently had an allergic reaction to adhesive tape that he used. The wound itself that we have been monitoring actually appears to be healed. There is a lot of  irritation at this point. 12/29/2019 upon evaluation today patient appears to be doing well in  regard to his lower extremities. His left medial thigh is open and somewhat draining today but this is the only region that is open the right has done much better with the treatment utilizing the steroid cream that I prescribed for him last week. Overall I am pleased in that regard. Fortunately there is no signs of active infection at this time. No fevers, chills, nausea, vomiting, or diarrhea. 01/05/2020 upon evaluation today patient appears to be doing more poorly in regard to his right lower extremity at this point upon evaluation today. Unfortunately he continues to have issues in this regard and I think the biggest issue is controlling his edema. This obviously is not very well controlled at this point is been recommended that he use the Lasix twice a day but he has not been able to do that. Unfortunately I think this is leading to an issue where honestly he is not really able to effectively control his edema and therefore the wounds really are not doing significantly better. I do not think that he is going to be able to keep things under good control unless he is able to control his edema much better. I discussed this again in great detail with him today. 01/12/2020 good news is patient actually appears to be doing quite well today at this point. He does have an appointment with lymphedema clinic tomorrow. His legs appear healed and the toe on the left is almost completely healed. In general I am very pleased with how things stand at this point. 01/19/2020 upon evaluation today patient appears to actually be doing well in regard to his lower extremities there is nothing open at this point. Fortunately he has done extremely well more recently. Has been seeing lymphedema clinic as well. With that being said he has Velcro wraps for his lower legs as well as his upper legs. The only wound really is on his toe  which is the right great toe and this is barely anything even there. With all that being said I think it is good to be appropriate today to go ahead and switch him over to the Velcro compression wraps. 01/26/2020 upon evaluation today patient appears to be doing worse with regard to his lower extremities after last week switch him to Velcro compression wraps. Unfortunately he lasted less than 24 hours he did not have the sock portion of his Velcro wrap on the left leg and subsequently developed a blister underneath the Velcro portion. Obviously this is not good and not what we were looking for at this point. He states the lymphedema clinic did tell him to wear the wrap for 23 hours and take him off for 1 I am okay with that plan but again right now we got a get things back under control again he may have some cellulitis noted as well. 02/02/2020 upon evaluation today patient unfortunately appears to have several areas of blistering on his bilateral lower extremities today mainly on the feet. His legs do seem to be doing somewhat better which is good news. Fortunately there is no evidence of active infection at this time. No fevers, chills, nausea, vomiting, or diarrhea. 02/16/2020 upon evaluation today patient appears to be doing well at this time with regard to his legs. He has a couple weeping areas on his toes but for the most part everything is doing better and does appear to be sealed up on his legs which is excellent news. We can continue with wrapping him at this point as  he had every time we discontinue the wraps he just breaks out with new wounds. There is really no point in is going forward with this at this point. 03/08/2020 upon evaluation today patient actually appears to be doing quite well with regard to his lower extremity ulcers. He has just a very superficial and really almost nonexistent blister on the left lower extremity he has in general done very well with the compression wraps. With  that being said I do not see any signs of infection at this time which is good news. 03/29/2020 upon evaluation today patient appears to be doing well with regard to his wounds currently except for where he had several new areas that opened up due to some of the wrap slipping and causing him trouble. He states he did not realize they had slipped. Nonetheless he has a 1 area on the right and 3 new areas on the left. Fortunately there is no signs of active infection at this time which is great news. 04/05/2020 upon evaluation today patient actually appears to be doing quite well in general in regard to his legs currently. Fortunately there is no signs of active infection at this time. No fevers, chills, nausea, vomiting, or diarrhea. He tells me next week that he will actually be seen in the lymphedema clinic on Thursday at 10 AM I see him on Wednesday next week. 04/12/2020 upon evaluation today patient appears to be doing very well with regard to his lower extremities bilaterally. In fact he does not appear to have any open wounds at this point which is good news. Fortunately there is no signs of active infection at this time. No fevers, chills, nausea, vomiting, or diarrhea. 04/19/2020 upon evaluation today patient appears to be doing well with regard to his wounds currently on the bilateral lower extremities. There does not appear to be any signs of active infection at this time. Fortunately there is no evidence of systemic infection and overall very pleased at this point. Nonetheless after I held him out last week he literally had blisters the next morning already which swelled up with him being right back here in the clinic. Overall I think that he is just not can be able to be discharged with his legs the way they are he is much to volume overloaded as far as fluid is concerned and that was discussed with him today of also discussed this but should try the clinic nurse manager as well as Dr.  Leanord Hawking. 04/26/2020 upon evaluation today patient appears to be doing better with regard to his wounds currently. He is making some progress and overall swelling is under good control with the compression wraps. Fortunately there is no evidence of active infection at this time. 05/10/2020 on evaluation today patient appears to be doing overall well in regard to his lower extremities bilaterally. He is Tolerating the compression wraps without complication and with what we are seeing currently I feel like that he is making excellent progress. There is no signs of active infection at this time. 05/24/2020 upon evaluation today patient appears to be doing well in regard to his legs. The swelling is actually quite a bit down compared to where it has been in the past. Fortunately there is no sign of active infection at this time which is also good news. With that being said he does have several wounds on his toes that have opened up at this point. 05/31/2020 upon evaluation today patient appears to be doing well with  regard to his legs bilaterally where he really has no significant fluid buildup at this point overall he seems to be doing quite well. Very pleased in this regard. With regard to his toes these also seem to be drying up which is excellent. We have continue to wrap him as every time we tried as a transition to the juxta light wraps things just do not seem to get any better. 06/07/2020 upon evaluation today patient appears to be doing well with regard to his right leg at this point. Unfortunately left leg has a lot of blistering he tells me the wrap started to slide down on him when he tried to put his other Velcro wrap over top of it to help keep things in order but nonetheless still had some issues. 06/14/2020 on evaluation today patient appears to be doing well with regard to his lower extremity ulcers and foot ulcers at this point. I feel like everything is actually showing signs of improvement which  is great news overall there is no signs of active infection at this time. No fevers, chills, nausea, vomiting, or diarrhea. 06/21/2020 on evaluation today patient actually appears to be doing okay in regard to his wounds in general. With that being said the biggest issue I see is on his right foot in particular the first and second toe seem to be doing a little worse due to the fact this is staying very wet. I think he is probably getting need to change out his dressings a couple times in between each week when we see him in regard to his toes in order to keep this drier based on the location and how this is proceeding. 06/28/2020 on evaluation today patient appears to be doing a little bit more poorly overall in regard to the appearance of the skin I am actually somewhat concerned about the possibility of him having a little bit of an infection here. We discussed the course of potentially giving him a doxycycline prescription which he is taken previously with good result. With that being said I do believe that this is potentially mild and at this point easily fixed. I just do not want anything to get any worse. 07/12/2020 upon evaluation today patient actually appears to be making some progress with regard to his legs which is great news there does not appear to be any evidence of active infection. Overall very pleased with where things stand. 07/26/2020 upon evaluation today patient appears to be doing well with regard to his leg ulcers and toe ulcers at this point. He has been tolerating the compression wraps without complication overall very pleased in this regard. 08/09/2020 upon evaluation today patient appears to be doing well with regard to his lower extremities bilaterally. Fortunately there is no signs of active infection overall I am pleased with where things stand. 08/23/2020 on evaluation today patient appears to be doing well with regard to his wound. He has been tolerating the dressing  changes without complication. Fortunately there is no signs of active infection at this time. Overall his legs seem to be doing quite well which is great news and I am very pleased in that regard. No fevers, chills, nausea, vomiting, or diarrhea. 09/13/2020 upon evaluation today patient appears to be doing okay in regard to his lower extremities. He does have a fairly large blister on the right leg which I did remove the blister tissue from today so we can get this to dry out other than that however he seems  to be doing quite well. There is no signs of active infection at this time. 09/27/2020 upon evaluation today patient appears to actually be doing some better in regard to his right leg. Fortunately signs of active infection at this time which is great news. No fevers, chills, nausea, vomiting, or diarrhea. 10/04/2020 upon evaluation today patient actually appears to be showing signs of improvement which is great news with regard to his leg ulcers. Fortunately there is no signs of active infection which is great news he is still taking the antibiotics currently. No fevers, chills, nausea, vomiting, or diarrhea. 10/18/2020 on evaluation today patient appears to be doing well with regard to his legs currently. He has been tolerating the dressing changes including the wraps without complication. Fortunately there is no signs of active infection at this time. No fevers, chills, nausea, vomiting, or diarrhea. 10/25/2020 upon evaluation today patient appears to be doing decently well in regard to his wounds currently. He has been tolerating the dressing changes without complication. Overall I feel like he is making good progress albeit slow. Again this is something we can have to continue to wrap for some time to come most likely. 11/08/2020 upon evaluation today patient appears to be doing well with regard to his wounds currently. He has been tolerating the dressing changes without complication is not  currently on any antibiotics and he does not appear to show any signs of infection. He does continue to have a lot of drainage on the right leg not too severe but nonetheless this is very scattered. On the left leg this is looking to be much improved overall. 11/15/2020 upon evaluation today patient appears to be doing better with regard to his legs bilaterally. Especially the right leg which was much more significant last week. There does not appear to be any signs of active infection which is great news. No fevers, chills, nausea, vomiting, or diarrhea. 11/23/2019 upon evaluation today patient appears to be doing poorly still in regard to his lower extremities bilaterally. Unfortunately his right leg in particular appears to be doing much more poorly there is no signs really of infection this is not warm to touch but he does have a lot of drainage and weeping unfortunately. With that reason I do believe that we may need to initiate some treatment here to try to help calm down some of the swelling of the right leg. I think switching to a 4-layer compression wrap would be beneficial here. The patient is in agreement with giving this a try. 11/29/2020 upon evaluation today patient appears to be doing well currently in regard to his leg ulcers. I feel like the right leg is doing better he still has a lot of drainage but we do see some improvement here. The 4-layer compression wrap I think was helpful. Electronic Signature(s) Signed: 11/29/2020 11:47:53 AM By: Lenda Kelp PA-C Entered By: Lenda Kelp on 11/29/2020 11:47:53 -------------------------------------------------------------------------------- Physical Exam Details Patient Name: Date of Service: [name], [name] 11/29/2020 10:30 A M Medical Record Number: [id number] Patient Account Number: [id number] Date of Birth/Sex: Treating RN: 08-Jun-1951 (70 y.o. [name] Primary Care Provider: Nicoletta Ba Other Clinician: Referring  Provider: Treating Provider/Extender: Adele Dan in Treatment: 253 Constitutional Obese and well-hydrated in no acute distress. Respiratory normal breathing without difficulty. Psychiatric this patient is able to make decisions and demonstrates good insight into disease process. Alert and Oriented x 3. pleasant and cooperative. Notes Patient's wound bed showed signs  of good granulation epithelization in general. There does not appear to be any signs of active infection right now which is great news. Overall I am pleased and hopeful he will continue to make good progress toward closure. Electronic Signature(s) Signed: 11/29/2020 11:48:09 AM By: Lenda Kelp PA-C Entered By: Lenda Kelp on 11/29/2020 11:48:09 -------------------------------------------------------------------------------- Physician Orders Details Patient Name: Date of Service: [name], [name] 11/29/2020 10:30 A M Medical Record Number: [id number] Patient Account Number: [id number] Date of Birth/Sex: Treating RN: December 04, 1950 (70 y.o. 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 Bathing/ Shower/ Hygiene May shower with protection but do not get wound dressing(s)  wet. Edema Control - Lymphedema / SCD / Other Bilateral Lower Extremities Lymphedema Pumps. Use Lymphedema pumps on leg(s) 2-3 times a day for 45-60 minutes. If wearing any wraps or hose, do not remove them. Continue exercising as instructed. Elevate legs to the level of the heart or above for 30 minutes daily and/or when sitting, a frequency of: - throughout the day Avoid standing for long periods of time. Exercise regularly Other Edema Control Orders/Instructions: - 4 layer compression wrap to left leg Non Wound Condition Left Lower Extremity Other Non Wound Condition Orders/Instructions: - lotion to leg, 4 layer compression wrap Wound Treatment Wound #185 - Lower Leg Wound Laterality: Right, Circumferential Peri-Wound Care: Ketoconazole Cream 2% 1 x Per Week Discharge Instructions: Apply Ketoconazole mixed with TCA and zinc to lower leg Peri-Wound Care: Triamcinolone 15 (g) 1 x Per Week Discharge Instructions: Use triamcinolone 15 (g) as directed Peri-Wound Care: Zinc Oxide Ointment 30g tube 1 x Per Week Discharge Instructions: Apply Zinc Oxide to periwound with each dressing change Prim Dressing: KerraCel Ag Gelling Fiber Dressing, 4x5 in (silver alginate) 1 x Per Week ary Discharge Instructions: Apply silver alginate to wound bed as instructed Secondary Dressing: ABD Pad, 8x10 1 x Per Week Discharge Instructions: Apply over primary dressing as directed. Secondary Dressing: Zetuvit Plus 4x8 in 1 x Per Week Discharge Instructions: Apply over primary dressing as needed Secondary Dressing: CarboFLEX Odor Control Dressing, 4x4 in 1 x Per Week Discharge Instructions: Apply over primary dressing as directed. Compression Wrap: FourPress (4 layer compression wrap) 1 x Per Week Discharge Instructions: Apply four layer compression unna layer at top to secure Wound #190 - T Second oe Wound Laterality: Right Prim Dressing: KerraCel Ag Gelling Fiber Dressing, 2x2 in (silver alginate) 1 x Per  Week/7 Days ary Discharge Instructions: Apply silver alginate to wound bed as instructed Secondary Dressing: Woven Gauze Sponges 2x2 in 1 x Per Week/7 Days Discharge Instructions: Apply over primary dressing as directed. Secured With: Insurance underwriter, Sterile 2x75 (in/in) 1 x Per Week/7 Days Discharge Instructions: Secure with stretch gauze as directed. Electronic Signature(s) Signed: 11/29/2020 5:03:34 PM By: Lenda Kelp PA-C Signed: 11/29/2020 6:09:51 PM By: Zenaida Deed RN, BSN Entered By: Zenaida Deed on 11/29/2020 11:50:00 -------------------------------------------------------------------------------- Problem List Details  Patient Name: Date of Service: [name], Kevin 11/29/2020 10:30 A M Medical Record Number: [id number] Patient Account Number: [id number] Date of Birth/Sex: Treating RN: [birth year] (70 y.o. [name] Primary Care Provider: Nicoletta Ba Other Clinician: Referring Provider: Treating Provider/Extender: Adele Dan in Treatment: [phone] Active Problems ICD-10 Encounter Code Description Active Date MDM Diagnosis 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 skin 06/30/2018 No Yes L97.221 Non-pressure chronic ulcer of left calf limited to breakdown of skin 09/30/2016 No Yes 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 of 01/22/2016 No Yes bilateral lower extremity I89.0 Lymphedema, not elsewhere classified 01/22/2016 No Yes E11.622 Type 2 diabetes mellitus with other skin ulcer 01/22/2016 No Yes 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, unspecified 01/22/2016 No Yes L03.116 Cellulitis of left lower limb 04/01/2017 No Yes Inactive Problems ICD-10 Code Description Active Date Inactive Date 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 skin 06/30/2017 06/30/2017 L97.521 Non-pressure chronic ulcer of other part  of left foot limited to breakdown of skin 04/27/2018 04/27/2018 L03.115 Cellulitis of right lower limb 12/22/2017 12/22/2017 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 06/30/2018 06/30/2018 L97.511 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 of skin 06/30/2018 06/30/2018 Resolved Problems Electronic Signature(s) Signed: 11/29/2020 10:35:16 AM By: Lenda Kelp PA-C Entered By: Lenda Kelp on 11/29/2020 10:35:16 -------------------------------------------------------------------------------- Progress Note Details Patient Name: Date of Service: [name], [name] 11/29/2020 10:30 A M Medical Record Number: [id number] Patient Account Number: [id number] Date of Birth/Sex: Treating RN: 02/06/1951 (70 y.o. [name] Primary Care Provider: Nicoletta Ba Other Clinician: Referring Provider: Treating Provider/Extender: Adele Dan in Treatment: [phone] Subjective Chief Complaint Information obtained from Patient patient is here for evaluation venous/lymphedema weeping History of Present Illness (HPI) Referred by PCP for consultation. Patient has long standing history of BLE venous stasis, no prior ulcerations. At beginning of month, developed cellulitis and weeping. Received IM Rocephin followed by Keflex and resolved. Wears compression stocking, appr 6 months old. Not sure strength. No present drainage. 01/22/16 this is a patient who is a type II diabetic on insulin. 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 He tells me he religiously wears pressure stockings of uncertain strength. He was here with weeping edema about 8 months ago but did not have an open wound. Roughly a month ago he had a reopening on his bilateral legs. He is been using bandages and Neosporin. He does not complain of pain. 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 Last BUN/creatinine I have is from 11/20/15 at 13 and 1.0 respectively 01/29/16; patient arrives today having tolerated the Profore wrap. He brought in his stockings and these are 18 mmHg stockings he bought from [place]. The compression here is likely inadequate. He does not complain of pain or excessive drainage she has no systemic symptoms. The wound on the right looks improved as does the one on the left although one on the left is more substantial with still tissue at risk below the actual wound area on the bilateral posterior calf 02/05/16; patient arrives with poor edema control. He states that we did put a 4 layer compression on it last week. No weight appear 5 this. 02/12/16; the area on the posterior right  Has healed. The left Has a substantial wound that has necrotic surface eschar that requires a debridement with a curette. 02/16/16;the patient called or a Nurse visit secondary to increased swelling. He had been in earlier in the week with his right leg healed. He was transitioned to is on pressure stocking on the right leg with the only open wound on the left, a substantial area on the left posterior calf. Note he has a history of severe lower extremity edema, he has a history of chronic atrial fibrillation but not heart failure per my notes but I'll need to research this. He is not complaining of chest pain shortness of breath or orthopnea. The intake nurse noted blisters on the previously closed right leg 02/19/16; this is the patient's regular visit day. I see him on Friday with escalating edema new wounds on the right leg and clear signs of at least right ventricular heart failure. I increased his Lasix to 40 twice a day. He is returning currently in follow-up. States he is noticed a decrease in that the edema 02/26/16 patient's legs have much less edema. There is nothing really open on the right leg. The left leg has  improved condition of the large superficial wound on the posterior left leg 03/04/16; edema control is very much better. The patient's right leg wounds have healed. On the left leg he continues to have severe venous inflammation on the posterior aspect of the left leg. There is no tenderness and I don't think any of this is cellulitis. 03/11/16; patient's right leg is married healed and he is in his own stocking. The patient's left leg has deteriorated somewhat. There is a lot of erythema around the wound on the posterior left leg. There is also a significant rim of erythema posteriorly just above where the wrap would've ended there is a new wound in this location and a lot of tenderness. Can't rule out cellulitis in this area. 03/15/16; patient's right leg remains healed and he is in his own stocking. The patient's left leg is much better than last review. His major wound on the posterior aspect of his left Is almost fully epithelialized. He has 3 small injuries from the wraps. Really. Erythema seems a lot better on antibiotics 03/18/16; right leg remains healed and he is in his own stocking. The patient's left leg is much better. The area on the posterior aspect of the left calf is fully epithelialized. His 3 small injuries which were wrap injuries on the left are improved only one seems still open his erythema has resolved 03/25/16; patient's right leg remains healed and he is in his own stocking. There is no open area today on the left leg posterior leg is completely closed up. His wrap injuries at the superior aspect of his leg are also resolved. He looks as though he has some irritation on the dorsal ankle but this is fully epithelialized without evidence of infection. 03/28/16; we discharged this patient on Monday. Transitioned him into his own stocking. There were problems almost immediately with uncontrolled swelling weeping edema multiple some of which have opened. He does not feel systemically  unwell in particular no chest pain no shortness of breath and he does not feel 04/08/16; the edema is under better control with the Profore light wrap but he still has pitting edema. There is one large wound anteriorly 2 on the medial aspect of his left leg and 3 small areas on the superior posterior calf. Drainage is not excessive he  is tolerating a Profore light well 04/15/16; put a Profore wrap on him last week. This is controlled is edema however he had a lot of pain on his left anterior foot most of his wounds are healed 04/22/16 once again the patient has denuded areas on the left anterior foot which he states are because his wrap slips up word. He saw his primary physician today is on Lasix 40 twice a day and states that he his weight is down 20 pounds over the last 3 months. 04/29/16: Much improved. left anterior foot much improved. He is now on Lasix 80 mg per day. Much improved edema control 05/06/16; I was hoping to be able to discharge him today however once again he has blisters at a low level of where the compression was placed last week mostly on his left lateral but also his left medial leg and a small area on the anterior part of the left foot. 05/09/16; apparently the patient went home after his appointment on 7/4 later in the evening developing pain in his upper medial thigh together with subjective fever and chills although his temperature was not taken. The pain was so intense he felt he would probably have to call 911. However he then remembered that he had leftover doxycycline from a previous round of antibiotics and took these. By the next morning he felt a lot better. He called and spoke to one of our nurses and I approved doxycycline over the phone thinking that this was in relation to the wounds we had previously seen although they were definitely were not. The patient feels a lot better old fever no chills he is still working. Blood sugars are reasonably controlled 05/13/16; patient  is back in for review of his cellulitis on his anterior medial upper thigh. He is taking doxycycline this is a lot better. Culture I did of the nodular area on the dorsal aspect of his foot grew MRSA this also looks a lot better. 05/20/16; the patient is cellulitis on the medial upper thigh has resolved. All of his wound areas including the left anterior foot, areas on the medial aspect of the left calf and the lateral aspect of the calf at all resolved. He has a new blister on the left dorsal foot at the level of the fourth toe this was excised. No evidence of infection 05/27/16; patient continues to complain weeping edema. He has new blisterlike wounds on the left anterior lateral and posterior lateral calf at the top of his wrap levels. The area on his left anterior foot appears better. He is not complaining of fever, pain or pruritus in his feet. 05/30/16; the patient's blisters on his left anterior leg posterior calf all look improved. He did not increase the Lasix 100 mg as I suggested because he was going to run out of his 40 mg tablets. He is still having weeping edema of his toes 06/03/16; I renewed his Lasix at 80 mg once a day as he was about to run out when I last saw him. He is on 80 mg of Lasix now. I have asked him to cut down on the excessive amount of water he was drinking and asked him to drink according to his thirst mechanisms 06/12/2016 -- was seen 2 days ago and was supposed to wear his compression stockings at home but he is developed lymphedema and superficial blisters on the left lower extremity and hence came in for a review 06/24/16; the remaining wound is on his  left anterior leg. He still has edema coming from between his toes. There is lymphedema here however his edema is generally better than when I last saw this. He has a history of atrial fibrillation but does not have a known history of congestive heart failure nevertheless I think he probably has this at least on a  diastolic basis. 07/01/16 I reviewed his echocardiogram from January 2017. This was essentially normal. He did not have LVH, EF of 55-60%. His right ventricular function was normal although he did have trivial tricuspid and pulmonic regurgitation. This is not audible on exam however. I increased his Lasix to do massive edema in his legs well above his knees I think in early July. He was also drinking an excessive amount of water at the time. 07/15/16; missed his appointment last week because of the Labor Day holiday on Monday. He could not get another appointment later in the week. Started to feel the wrap digging in superiorly so we remove the top half and the bottom half of his wrap. He has extensive erythema and blistering superiorly in the left leg. Very tender. Very swollen. Edema in his foot with leaking edema fluid. He has not been systemically unwell 07/22/16; the area on the left leg laterally required some debridement. The medial wounds look more stable. His wrap injury wounds appear to have healed. Edema and his foot is better, weeping edema is also better. He tells me he is meeting with the supplier of the external compression pumps at work 08/05/16; the patient was on vacation last week in [facility]. His wrap is been on for an extended period of time. Also over the weekend he developed an extensive area of tender erythema across his anterior medial thigh. He took to doxycycline yesterday that he had leftover from a previous prescription. The patient complains of weeping edema coming out of his toes 08/08/16; I saw this patient on 10/2. He was tender across his anterior thigh. I put him on doxycycline. He returns today in follow-up. He does not have any open wounds on his lower leg, he still has edema weeping into his toes. 08/12/16; patient was seen back urgently today to follow-up for his extensive left thigh cellulitis/erysipelas. He comes back with a lot less swelling and erythema pain is  much better. I believe I gave him Augmentin and Cipro. His wrap was cut down as he stated a roll down his legs. He developed blistering above the level of the wrap that remained. He has 2 open blisters and 1 intact. 08/19/16; patient is been doing his primary doctor who is increased his Lasix from 40-80 once a day or 80 already has less edema. Cellulitis has remained improved in the left thigh. 2 open areas on the posterior left calf 08/26/16; he returns today having new open blisters on the anterior part of his left leg. He has his compression pumps but is not yet been shown how to use some vital representative from the supplier. 09/02/16 patient returns today with no open wounds on the left leg. Some maceration in his plantar toes 09/10/2016 -- Dr. Leanord Hawking had recently discharged him on 09/02/2016 and he has come right back with redness swelling and some open ulcers on his left lower extremity. He says this was caused by trying to apply his compression stockings and he's been unable to use this and has not been able to use his lymphedema pumps. He had some doxycycline leftover and he has started on this a few  days ago. 09/16/16; there are no open wounds on his leg on the left and no evidence of cellulitis. 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 He does not complain of symptoms here. I am not clear use using his external compression pumps. 09/23/16; I have not seen this patient in 2 weeks. He canceled his appointment 10 days ago as he was going on vacation. He tells me that on Monday he noticed a large area on his posterior left leg which is been draining copiously and is reopened into a large wound. He is been using ABDs and the external part of his juxtalite, according to our nurse this was not on properly. 10/07/16; Still a substantial area on the posterior left leg. Using silver alginate 10/14/16; in general better although there is  still open area which looks healthy. Still using silver alginate. He reminds me that this happen before he left for [name]. T oday while he was showering in the morning. He had been using his juxtalite's 10/21/16; the area on his posterior left leg is fully epithelialized. However he arrives today with a large area of tender erythema in his medial and posterior left thigh just above the knee. I have marked the area. Once again he is reluctant to consider hospitalization. I treated him with oral antibiotics in the past for a similar situation with resolution I think with doxycycline however this area it seems more extensive to me. He is not complaining of fever but does have chills and says states he is thirsty. His blood sugar today was in the 140s at home 10/25/16 the area on his posterior left leg is fully epithelialized although there is still some weeping edema. The large area of tenderness and erythema in his medial and posterior left thigh is a lot less tender although there is still a lot of swelling in this thigh. He states he feels a lot better. He is on doxycycline and Augmentin that I started last week. This will continued until Tuesday, December 26. I have ordered a duplex ultrasound of the left thigh rule out DVT whether there is an abscess something that would need to be drained I would also like to know. 11/01/16; he still has weeping edema from a not fully epithelialized area on his left posterior calf. Most of the rest of this looks a lot better. He has completed his antibiotics. His thigh is a lot better. Duplex ultrasound did not show a DVT in the thigh 11/08/16; he comes in today with more Denuded surface epithelium from the posterior aspect of his calf. There is no real evidence of cellulitis. The superior aspect of his wrap appears to have put quite an indentation in his leg just below the knee and this may have contributed. He does not complain of pain or fever. We have been  using silver alginate as the primary dressing. The area of cellulitis in the right thigh has totally resolved. He has been using his compression stockings once a week 11/15/16; the patient arrives today with more loss of epithelium from the posterior aspect of his left calf. He now has a fairly substantial wound in this area. The reason behind this deterioration isn't exactly clear although his edema is not well controlled. He states he feels he is generally more swollen systemically. He is not complaining of chest pain shortness of breath fever. T me he has an appointment with his primary physician in early February.  He is on 80 mg of oral ells Lasix a day. He claims compliance with the external compression pumps. He is not having any pain in his legs similar to what he has with his recurrent cellulitis 11/22/16; the patient arrives a follow-up of his large area on his left lateral calf. This looks somewhat better today. He came in earlier in the week for a dressing change since I saw him a week ago. He is not complaining of any pain no shortness of breath no chest pain 11/28/16; the patient arrives for follow-up of his large area on the left lateral calf this does not look better. In fact it is larger weeping edema. The surface of the wound does not look too bad. We have been using silver alginate although I'm not certain that this is a dressing issue. 12/05/16; again the patient follows up for a large wound on the left lateral and left posterior calf this does not look better. There continues to be weeping edema necrotic surface tissue. More worrisome than this once again there is erythema below the wound involving the distal Achilles and heel suggestive of cellulitis. He is on his feet working most of the day of this is not going well. We are changing his dressing twice a week to facilitate the drainage. 12/12/16; not much change in the overall dimensions of the large area on the left posterior calf. This  is very inflamed looking. I gave him an. Doxycycline last week does not really seem to have helped. He found the wrap very painful indeed it seems to of dog into his legs superiorly and perhaps around the heel. He came in early today because the drainage had soaked through his dressings. 12/19/16- patient arrives for follow-up evaluation of his left lower extremity ulcers. He states that he is using his lymphedema pumps once daily when there is "no drainage". He admits to not using his lipedema pumps while under current treatment. His blood sugars have been consistently between 150-200. 12/26/16; the patient is not using his compression pumps at home because of the wetness on his feet. I've advised him that I think it's important for him to use this daily. He finds his feet too wet, he can put a plastic bag over his legs while he is in the pumps. Otherwise I think will be in a vicious circle. We are using silver alginate to the major area on his left posterior calf 01/02/17; the patient's posterior left leg has further of all into 3 open wounds. All of them covered with a necrotic surface. He claims to be using his compression pumps once a day. His edema control is marginal. Continue with silver alginate 01/10/17; the patient's left posterior leg actually looks somewhat better. There is less edema, less erythema. Still has 3 open areas covered with a necrotic surface requiring debridement. He claims to be using his compression pumps once a day his edema control is better 01/17/17; the patient's left posterior calf look better last week when I saw him and his wrap was changed 2 days ago. He has noted increasing pain in the left heel and arrives today with much larger wounds extensive erythema extending down into the entire heel area especially tender medially. He is not systemically unwell CBGs have been controlled no fever. Our intake nurse showed me limegreen drainage on his AVD pads. 01/24/17; his usual  this patient responds nicely to antibiotics last week giving him Levaquin for presumed Pseudomonas. The whole entire posterior part of  his leg is much better much less inflamed and in the case of his Achilles heel area much less tender. He has also had some epithelialization posteriorly there are still open areas here and still draining but overall considerably better 01/31/17- He has continue to tolerate the compression wraps. he states that he continues to use the lymphedema pumps daily, and can increase to twice daily on the weekends. He is voicing no complaints or concerns regarding his LLE ulcers 02/07/17-he is here for follow-up evaluation. He states that he noted some erythema to the left medial and anterior thigh, which he states is new as of yesterday. He is concerned about recurrent cellulitis. He states his blood sugars have been slightly elevated, this morning in the 180s 02/14/17; he is here for follow-up evaluation. When he was last here there was erythema superiorly from his posterior wound in his anterior thigh. He was prescribed Levaquin however a culture of the wound surface grew MRSA over the phone I changed him to doxycycline on Monday and things seem to be a lot better. 02/24/17; patient missed his appointment on Friday therefore we changed his nurse visit into a physician visit today. Still using silver alginate on the large area of the posterior left thigh. He isn't new area on the dorsal left second toe 03/03/17; actually better today although he admits he has not used his external compression pumps in the last 2 days or so because of work responsibilities over the weekend. 03/10/17; continued improvement. External compression pumps once a day almost all of his wounds have closed on the posterior left calf. Better edema control 03/17/17; in general improved. He still has 3 small open areas on the lateral aspect of his left leg however most of the area on the posterior part of his leg  is epithelialized. He has better edema control. He has an ABD pad under his stocking on the right anterior lower leg although he did not let us look at that today. 03/24/17; patient arrives back in clinic today with no open areas however there are areas on the posterior left calf and anterior left calf that are less than 100% epithelialized. His edema is well controlled in the left lower leg. There is some pitting edema probably lymphedema in the left upper thigh. He uses compression pumps at home once per day. I tried to get him to do this twice a day although he is very reticent. 04/01/2017 -- for the last 2 days he's had significant redness, tenderness and weeping and came in for an urgent visit today. 04/07/17; patient still has 6 more days of doxycycline. He was seen by Dr. Meyer Russel last Wednesday for cellulitis involving the posterior aspect, lateral aspect of his Involving his heel. For the most part he is better there is less erythema and less weeping. He has been on his feet for 12 hours o2 over the weekend. Using his compression pumps once a day 04/14/17 arrives today with continued improvement. Only one area on the posterior left calf that is not fully epithelialized. He has intense bilateral venous inflammation associated with his chronic venous insufficiency disease and secondary lymphedema. We have been using silver alginate to the left posterior calf wound In passing he tells [place] today that the right leg but we have not seen in quite some time has an open area on it but he doesn't want [place] to look at this today states he will show this to [place] next week. 04/21/17; there is no open area  on his left leg although he still reports some weeping edema. He showed [place] his right leg today which is the first time we've seen this leg in a long time. He has a large area of open wound on the right leg anteriorly healthy granulation. Quite a bit of swelling in the right leg and some degree of venous  inflammation. He told us about the right leg in passing last week but states that deterioration in the right leg really only happened over the weekend 04/28/17; there is no open area on the left leg although there is an irritated part on the posterior which is like a wrap injury. The wound on the right leg which was new from last week at least to [place] is a lot better. 05/05/17; still no open area on the left leg. Patient is using his new compression stocking which seems to be doing a good job of controlling the edema. He states he is using his compression pumps once per day. The right leg still has an open wound although it is better in terms of surface area. Required debridement. A lot of pain in the posterior right Achilles marked tenderness. Usually this type of presentation this patient gives concern for an active cellulitis 05/12/17; patient arrives today with his major wound from last week on the right lateral leg somewhat better. Still requiring debridement. He was using his compression stocking on the left leg however that is reopened with superficial wounds anteriorly he did not have an open wound on this leg previously. He is still using his juxta light's once daily at night. He cannot find the time to do this in the morning as he has to be at work by 7 AM 05/19/17; right lateral leg wound looks improved. No debridement required. The concerning area is on the left posterior leg which appears to almost have a subcutaneous hemorrhagic component to it. We've been using silver alginate to all the wounds 05/26/17; the right lateral leg wound continues to look improved. However the area on the left posterior calf is a tightly adherent surface. [place] using silver alginate. Because of the weeping edema in his legs there is very little good alternatives. 06/02/17; the patient left here last week looking quite good. Major wound on the left posterior calf and a small one on the right lateral calf. Both of these  look satisfactory. He tells me that by Wednesday he had noted increased pain in the left leg and drainage. He called on Thursday and Friday to get an appointment here but we were blocked. He did not go to urgent care or his primary physician. He thinks he had a fever on Thursday but did not actually take his temperature. He has not been using his compression pumps on the left leg because of pain. I advised him to go to the emergency room today for IV antibiotics for stents of left leg cellulitis but he has refused I have asked him to take 2 days off work to keep his leg elevated and he has refused this as well. In view of this I'm going to call him and Augmentin and doxycycline. 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 However after a detailed discussion regarding the risk benefits and alternatives  he refuses to get admitted to the hospital. With no other recourse I will continue him on oral antibiotics as before and hopefully he'll have his infectious disease consultation this week. 06/16/2017 -- the patient was seen today by the nurse practitioner at infectious disease Ms. Dixon. 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 He has also been advise Lotrimin cream locally. she also had wise IV antibiotics for his condition if it flares up 06/23/17; patient arrives today with drainage bilaterally although the remaining wound on the left posterior calf after cleaning up today "highlighter yellow drainage" did not look too bad. Unfortunately he has had breakdown on the right anterior leg  [previously this leg had not been open and he is using a black stocking] he went to see infectious disease and is been put on clindamycin 150 mg daily, I did not verify the dose although I'm not familiar with using clindamycin in this dosing range, perhaps for prophylaxisoo 06/27/17; I brought this patient back today to follow-up on the wound deterioration on the right lower leg together with surrounding cellulitis. I started him on doxycycline 4 days ago. This area looks better however he comes in today with intense cellulitis on the medial part of his left thigh. This is not have a wound in this area. Extremely tender. We've been using silver alginate to the wounds on the right lower leg left lower leg with bilateral 4 layer compression he is using his external compression pumps once a day 07/04/17; patient's left medial thigh cellulitis looks better. He has not been using his compression pumps as his insert said it was contraindicated with cellulitis. His right leg continues to make improvements all the wounds are still open. We only have one remaining wound on the left posterior calf. Using silver alginate to all open areas. He is on doxycycline which I started a week ago and should be finishing I gave him Augmentin after Thursday's visit for the severe cellulitis on the left medial thigh which fortunately looks better 07/14/17; the patient's left medial thigh cellulitis has resolved. The cellulitis in his right lower calf on the right also looks better. All of his wounds are stable to improved we've been using silver alginate he has completed the antibiotics I have given him. He has clindamycin 150 mg once a day prescribed by infectious disease for prophylaxis, I've advised him to start this now. We have been using bilateral Unna boots over silver alginate to the wound areas 07/21/17; the patient is been to see infectious disease who noted his recurrent problems with cellulitis. He was not able to  tolerate prophylactic clindamycin therefore he is on amoxicillin 500 twice a day. He also had a second daily dose of Lasix added By Dr. Oneta Rack but he is not taking this. Nor is he being completely compliant with his compression pumps a especially not this week. He has 2 remaining wounds one on the right posterior lateral lower leg and one on the left posterior medial lower leg. 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 The patient has Unna boots bilaterally. Still wounds on his right lateral, left medial, and a new open area on the left anterior lateral lower leg 08/04/17; he remains on amoxicillin twice a day for  prophylaxis of recurrent cellulitis. He has bilateral Unna boots for compression and silver alginate to his wounds. Arrives today with his legs looking as good as I have seen him in quite some time. Not surprisingly his wounds look better as well with improvement on the right lateral leg venous insufficiency wound and also the left medial leg. He is still using the compression pumps once a day 08/11/17; both legs appear to be doing better wounds on the right lateral and left medial legs look better. Skin on the right leg quite good. He is been using silver alginate as the primary dressing. I'm going to use Anasept gel calcium alginate and maintain all the secondary dressings 08/18/17; the patient continues to actually do quite well. The area on his right lateral leg is just about closed the left medial also looks better although it is still moist in this area. His edema is well controlled we have been using Anasept gel with calcium alginate and the usual secondary dressings, 4 layer compression and once daily use of his compression pumps "always been able to manage 09/01/17; the patient continues to do reasonably well in spite of his trip to [place]. The area on the right lateral leg is epithelialized. Left is much  better but still open. He has more edema and more chronic erythema on the left leg [venous inflammation] 09/08/17; he arrives today with no open wound on the right lateral leg and decently controlled edema. Unfortunately his left leg is not nearly as in his good situation as last week.he apparently had increasing edema starting on Saturday. He edema soaked through into his foot so used a plastic bag to walk around his home. The area on the medial right leg which was his open area is about the same however he has lost surface epithelium on the left lateral which is new and he has significant pain in the Achilles area of the left foot. He is already on amoxicillin chronically for prophylaxis of cellulitis in the left leg 09/15/17; he is completed a week of doxycycline and the cellulitis in the left posterior leg and Achilles area is as usual improved. He still has a lot of edema and fluid soaking through his dressings. There is no open wound on the right leg. He saw infectious disease NP today 09/22/17;As usual 1 we transition him from our compression wraps to his stockings things did not go well. He has several small open areas on the right leg. He states this was caused by the compression wrap on his skin although he did not wear this with the stockings over them. He has several superficial areas on the left leg medially laterally posteriorly. He does not have any evidence of active cellulitis especially involving the left Achilles The patient is traveling from [facility] Saturday going to [facility]. He states he isn't attempting to get an appointment with a heel objects wound center there to change his dressings. I am not completely certain whether this will work 10/06/17; the patient came in on Friday for a nurse visit and the nurse reported that his legs actually look quite good. He arrives in clinic today for his regular follow-up visit. He has a new wound on his left third toe over the PIP  probably caused by friction with his footwear. He has small areas on the left leg and a very superficial but epithelialized area on the right anterior lateral lower leg. Other than that his legs look as good as I've seen  him in quite some time. We have been using silver alginate Review of systems; no chest pain no shortness of breath other than this a 10 point review of systems negative 10/20/17; seen by Dr. Meyer Russel last week. He had taken some antibiotics [doxycycline] that he had left over. Dr. Meyer Russel thought he had candida infection and declined to give him further antibiotics. He has a small wound remaining on the right lateral leg several areas on the left leg including a larger area on the left posterior several left medial and anterior and a small wound on the left lateral. The area on the left dorsal third toe looks a lot better. ROS; Gen.; no fever, respiratory no cough no sputum Cardiac no chest pain other than this 10 point review of system is negative 10/30/17; patient arrives today having fallen in the bathtub 3 days ago. It took him a while to get up. He has pain and maceration in the wounds on his left leg which have deteriorated. He has not been using his pumps he also has some maceration on the right lateral leg. 11/03/17; patient continues to have weeping edema especially in the left leg. This saturates his dressings which were just put on on 12/27. As usual the doxycycline seems to take care of the cellulitis on his lower leg. He is not complaining of fever, chills, or other systemic symptoms. He states his leg feels a lot better on the doxycycline I gave him empirically. He also apparently gets injections at his primary doctor's officeo Rocephin for cellulitis prophylaxis. I didn't ask him about his compression pump compliance today I think that's probably marginal. Arrives in the clinic with all of his dressings primary and secondary macerated full of fluid and he has bilateral  edema 11/10/17; the patient's right leg looks some better although there is still a cluster of wounds on the right lateral. The left leg is inflamed with almost circumferential skin loss medially to laterally although we are still maintaining anteriorly. He does not have overt cellulitis there is a lot of drainage. He is not using compression pumps. We have been using silver alginate to the wound areas, there are not a lot of options here 11/17/17; the patient's right leg continues to be stable although there is still open wounds, better than last week. The inflammation in the left leg is better. Still loss of surface layer epithelium especially posteriorly. There is no overt cellulitis in the amount of edema and his left leg is really quite good, tells me he is using his compression pumps once a day. 11/24/17; patient's right leg has a small superficial wound laterally this continues to improve. The inflammation in the left leg is still improving however we have continuous surface layer epithelial loss posteriorly. There is no overt cellulitis in the amount of edema in both legs is really quite good. He states he is using his compression pumps on the left leg once a day for 5 out of 7 days 12/01/17; very small superficial areas on the right lateral leg continue to improve. Edema control in both legs is better today. He has continued loss of surface epithelialization and left posterior calf although I think this is better. We have been using silver alginate with large number of absorptive secondary dressings 4 layer on the left Unna boot on the right at his request. He tells me he is using his compression pumps once a day 12/08/17; he has no open area on the right leg is edema  control is good here. ooOn the left leg however he has marked erythema and tenderness breakdown of skin. He has what appears to be a wrap injury just distal to the popliteal fossa. This is the pattern of his recurrent cellulitis area  and he apparently received penicillin at his primary physician's office really worked in my view but usually response to doxycycline given it to him several times in the past 12/15/17; the patient had already deteriorated last Friday when he came in for his nurse check. There was swelling erythema and breakdown in the right leg. He has much worse skin breakdown in the left leg as well multiple open areas medially and posteriorly as well as laterally. He tells me he has been using his compression pumps but tells me he feels that the drainage out of his leg is worse when he uses a compression pumps. T be fair to him he is been saying this o for a while however I don't know that I have really been listening to this. I wonder if the compression pumps are working properly 12/22/17;. Once again he arrives with severe erythema, weeping edema from the left greater than right leg. Noncompliance with compression pumps. New this visit he is complaining of pain on the lateral aspect of the right leg and the medial aspect of his right thigh. He apparently saw his cardiologist Dr. [name] who was ordered an echocardiogram area and I think this is a step in the right direction 12/25/17; started his doxycycline Monday night. There is still intense erythema of the right leg especially in the anterior thigh although there is less tenderness. The erythema around the wound on the right lateral calf also is less tender. He still complaining of pain in the left heel. His wounds are about the same right lateral left medial left lateral. Superficial but certainly not close to closure. He denies being systemically unwell no fever chills no abdominal pain no diarrhea 12/29/17; back in follow-up of his extensive right calf and right thigh cellulitis. I added amoxicillin to cover possible doxycycline resistant strep. This seems to of done the trick he is in much less pain there is much less erythema and swelling. He has his  echocardiogram at 11:00 this morning. X-ray of the left heel was also negative. 01/05/18; the patient arrived with his edema under much better control. Now that he is retired he is able to use his compression pumps daily and sometimes twice a day per the patient. He has a wound on the right leg the lateral wound looks better. Area on the left leg also looks a lot better. He has no evidence of cellulitis in his bilateral thighs I had a quick peak at his echocardiogram. He is in normal ejection fraction and normal left ventricular function. He has moderate pulmonary hypertension moderately reduced right ventricular function. One would have to wonder about chronic sleep apnea although he says he doesn't snore. He'll review the echocardiogram with his cardiologist. 01/12/18; the patient arrives with the edema in both legs under exemplary control. He is using his compression pumps daily and sometimes twice daily. His wound on the right lateral leg is just about closed. He still has some weeping areas on the posterior left calf and lateral left calf although everything is just about closed here as well. I have spoken with Aldean Baker who is the patient's nurse practitioner and infectious disease. She was concerned that the patient had not understood that the parenteral penicillin injections he was  receiving for cellulitis prophylaxis was actually benefiting him. I don't think the patient actually saw that I would tend to agree we were certainly dealing with less infections although he had a serious one last month. 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 He is healed. He'll be placed in juxtalite compression wraps and increase his lymphedema pumps to twice daily. We will follow up again next week to ensure there are no issues with the new regiment. 01/20/18-he is here for evaluation of bilateral lower extremity weeping edema. Yesterday he was placed in compression wrap  to the right lower extremity and compression stocking to left lower shrubbery. He states he uses lymphedema pumps last night and again this morning and noted a blister to the left lower extremity. On exam he was noted to have drainage to the right lower extremity. He will be placed in Unna boots bilaterally and follow-up next week 01/26/18; patient was actually discharged a week ago to his own juxta light stockings only to return the next day with bilateral lower extremity weeping edema.he was placed in bilateral Unna boots. He arrives today with pain in the back of his left leg. There is no open area on the right leg however there is a linear/wrap injury on the left leg and weeping edema on the left leg posteriorly. I spoke with infectious disease about 10 days ago. They were disappointed that the patient elected to discontinue prophylactic intramuscular penicillin shots as they felt it was particularly beneficial in reducing the frequency of his cellulitis. I discussed this with the patient today. He does not share this view. He'll definitely need antibiotics today. Finally he is traveling to [place] and trauma leaving this Saturday and returning a week later and he does not travel with his pumps. He is going by car 01/30/18; patient was seen 4 days ago and brought back in today for review of cellulitis in the left leg posteriorly. I put him on amoxicillin this really hasn't helped as much as I might like. He is also worried because he is traveling to [facility] trauma by car. Finally we will be rewrapping him. There is no open area on the right leg over his left leg has multiple weeping areas as usual 02/09/18; The same wrap on for 10 days. He did not pick up the last doxycycline I prescribed for him. He apparently took 4 days worth he already had. There is nothing open on his right leg and the edema control is really quite good. He's had damage in the left leg medially and laterally especially probably  related to the prolonged use of Unna boots 02/12/18; the patient arrived in clinic today for a nurse visit/wrap change. He complained of a lot of pain in the left posterior calf. He is taking doxycycline that I previously prescribed for him. Unfortunately even though he used his stockings and apparently used to compression pumps twice a day he has weeping edema coming out of the lateral part of his right leg. This is coming from the lower anterior lateral skin area. 02/16/18; the patient has finished his doxycycline and will finish the amoxicillin 2 days. The area of cellulitis in the left calf posteriorly has resolved. He is no longer having any pain. He tells me he is using his compression pumps at least once a day sometimes twice. 02/23/18; the patient finished his doxycycline and Amoxil last week. On Friday he noticed a small erythematous circle about the size of a quarter  on the left lower leg just above his ankle. This rapidly expanded and he now has erythema on the lateral and posterior part of the thigh. This is bright red. Also has an area on the dorsal foot just above his toes and a tender area just below the left popliteal fossa. He came off his prophylactic penicillin injections at his own insistence one or 2 months ago. This is obviously deteriorated since then 03/02/18; patient is on doxycycline and Amoxil. Culture I did last week of the weeping area on the back of his left calf grew group B strep. I have therefore renewed the amoxicillin 500 3 times a day for a further week. He has not been systemically unwell. Still complaining of an area of discomfort right under his left popliteal fossa. There is no open wound on the right leg. He tells me that he is using his pumps twice a day on most days 03/09/18; patient arrives in clinic today completing his amoxicillin today. The cellulitis on his left leg is better. Furthermore he tells me that he had intramuscular penicillin shots that his primary  care office today. However he also states that the wrap on his right leg fell down shortly after leaving clinic last week. He developed a large blister that was present when he came in for a nurse visit later in the week and then he developed intense discomfort around this area.He tells me he is using his compression pumps 03/16/18; the patient has completed his doxycycline. The infectious part of this/cellulitis in the left heel area left popliteal area is a lot better. He has 2 open areas on the right calf. Still areas on the left calf but this is a lot better as well. 03/24/18; the patient arrives complaining of pain in the left popliteal area again. He thinks some of this is wrap injury. He has no open area on the right leg and really no open area on the left calf either except for the popliteal area. He claims to be compliant with the compression pumps 03/31/18; I gave him doxycycline last week because of cellulitis in the left popliteal area. This is a lot better although the surface epithelium is denuded off and response to this. He arrives today with uncontrolled edema in the right calf area as well as a fingernail injury in the right lateral calf. There is only a few open areas on the left 04/06/18; I gave him amoxicillin doxycycline over the last 2 weeks that the amoxicillin should be completing currently. He is not complaining of any pain or systemic symptoms. The only open areas see has is on the right lateral lower leg paradoxically I cannot see anything on the left lower leg. He tells me he is using his compression pumps twice a day on most days. Silver alginate to the wounds that are open under 4 layer compression 04/13/18; he completed antibiotics and has no new complaints. Using his compression pumps. Silver alginate that anything that's opened 04/20/18; he is using his compression pumps religiously. Silver alginate 4 layer compression anything that's opened. He comes in today with no open  wounds on the left leg but 3 on the right including a new one posteriorly. He has 2 on the right lateral and one on the right posterior. He likes Unna boots on the right leg for reasons that aren't really clear we had the usual 4 layer compression on the left. It may be necessary to move to the 4 layer compression on the  right however for now I left them in the Unna boots 04/27/18; he is using his compression pumps at least once a day. He has still the wounds on the right lateral calf. The area right posteriorly has closed. He does not have an open wound on the left under 4 layer compression however on the dorsal left foot just proximal to the toes and the left third toe 2 small open areas were identified 05/11/18; he has not uses compression pumps. The areas on the right lateral calf have coalesced into one large wound necrotic surface. On the left side he has one small wound anteriorly however the edema is now weeping out of a large part of his left leg. He says he wasn't using his pumps because of the weeping fluid. I explained to him that this is the time he needs to pump more 05/18/18; patient states he is using his compression pumps twice a day. The area on the right lateral large wound albeit superficial. On the left side he has innumerable number of small new wounds on the left calf particularly laterally but several anteriorly and medially. All these appear to have healthy granulated base these look like the remnants of blisters however they occurred under compression. The patient arrives in clinic today with his legs somewhat better. There is certainly less edema, less multiple open areas on the left calf and the right anterior leg looks somewhat better as well superficial and a little smaller. However he relates pain and erythema over the last 3-4 days in the thigh and I looked at this today. He has not been systemically unwell no fever no chills no change in blood sugar values 05/25/18; comes  in today in a better state. The severe cellulitis on his left leg seems better with the Keflex. Not as tender. He has not been systemically unwell ooHard to find an open wound on the left lower leg using his compression pumps twice a day ooThe confluent wounds on his right lateral calf somewhat better looking. These will ultimately need debridement I didn't do this today. 06/01/18; the severe cellulitis on the left anterior thigh has resolved and he is completed his Keflex. ooThere is no open wound on the left leg however there is a superficial excoriation at the base of the third toe dorsally. Skin on the bottom of his left foot is macerated looking. ooThe left the wounds on the lateral right leg actually looks some better although he did require debridement of the top half of this wound area with an open curet 06/09/18 on evaluation today patient appears to be doing poorly in regard to his right lower extremity in particular this appears to likely be infected he has very thick purulent discharge along with a bright green tent to the discharge. This makes me concerned about the possibility of pseudomonas. He's also having increased discomfort at this point on evaluation. Fortunately there does not appear to be any evidence of infection spreading to the other location at this time. 06/16/18 on evaluation today patient appears to actually be doing fairly well. His ulcer has actually diminished in size quite significantly at this point which is good news. Nonetheless he still does have some evidence of infection he did see infectious disease this morning before coming here for his appointment. I did review the results of their evaluation and their note today. They did actually have him discontinue the Cipro and initiate treatment with linezolid at this time. He is doing this for the  next seven days and they recommended a follow-up in four months with them. He is the keep a log of the need for intermittent  antibiotic therapy between now and when he falls back up with infectious disease. This will help them gaze what exactly they need to do to try and help them out. 06/23/18; the patient arrives today with no open wounds on the left leg and left third toe healed. He is been using his compression pumps twice a day. On the right lateral leg he still has a sizable wound but this is a lot better than last time I saw this. 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 Has been using silver alginate under 4 layer compression 06/30/18; the only open wound he has is on the right lateral leg and this looks healthy. No debridement is required. We have been using silver alginate. He does not have an open wound on the left leg. There is apparently some drainage from the dorsal proximal third toe on the left although I see no open wound here. 07/03/18 on evaluation today patient was actually here just for a nurse visit rapid change. However when he was here on Wednesday for his rat change due to having been healed on the left and then developing blisters we initiated the wrap again knowing that he would be back today for [place] to reevaluate and see were at. Unfortunately he has developed some cellulitis into the proximal portion of his right lower extremity even into the region of his thigh. 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 He was placed on one as what at that point. Nonetheless he is done with that and has been tolerating it well otherwise. Doxycycline which in the past really did not seem to be effective for him. Nonetheless I think the best option may be for [place] to definitely reinitiate the antibiotics for a longer period of time. 07/07/18; since I last saw this patient a week ago he has had a difficult time. At that point he did not have an open wound on his left leg. We  transitioned him into juxta light stockings. He was apparently in the clinic the next day with blisters on the left lateral and left medial lower calf. He also had weeping edema fluid. He was put back into a compression wrap. He was also in the clinic on Friday with intense erythema in his right thigh. Per the patient he was started on Bactrim however that didn't work at all in terms of relieving his pain and swelling. He has taken 3 doxycycline that he had left over from last time and that seems to of helped. He has blistering on the right thigh as well. 07/14/18; the erythema on his right thigh has gotten better with doxycycline that he is finishing. 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 Presumably this cellulitis in the thigh was not related to that although I think this is a bit concerning going forward. He still has an area on the right lateral calf the blister on the right medial calf just below the knee that was discussed above. On the left 2 small open areas left medial and left lateral. Edema control is adequate. He is using his compression pumps twice a day 07/20/18;  continued improvement in the condition of both legs especially the edema in his bilateral thighs. He tells me he is been losing weight through a combination of diet and exercise. He is using his compression pumps twice a day. So overall she made to the remaining wounds 07/27/2018; continued improvement in condition of both legs. His edema is well controlled. The area on the right lateral leg is just about closed he had one blisters show up on the medial left upper calf. We have him in 4 layer compression. He is going on a 10-day trip to [place], [place] and [place]. He will be driving. He wants to wear Unna boots because of the lessening amount of constriction. He will not use compression pumps while he is away 08/05/18 on evaluation today patient  actually appears to be doing decently well all things considered in regard to his bilateral lower extremities. The worst ulcer is actually only posterior aspect of his left lower extremity with a four layer compression wrap cut into his leg a couple weeks back. He did have a trip and actually had [employer] for the trip that he is worn since he was last here. Nonetheless he feels like the [employer] actually do better for him his swelling is up a little bit but he also with his trip was not taking his Lasix on a regular set schedule like he was supposed to be. He states that obviously the reason being that he cannot drive and keep going without having to urinate too frequently which makes it difficult. He did not have his pumps with him while he was away either which I think also maybe playing a role here too. 08/13/2018; the patient only has a small open wound on the right lateral calf which is a big improvement in the last month or 2. He also has the area posteriorly just below the posterior fossa on the left which I think was a wrap injury from several weeks ago. He has no current evidence of cellulitis. He tells me he is back into his compression pumps twice a day. He also tells me that while he was at the laundromat somebody stole a section of his extremitease stockings 08/20/2018; back in the clinic with a much improved state. He only has small areas on the right lateral mid calf which is just about healed. This was is more substantial area for quite a prolonged period of time. He has a small open area on the left anterior tibia. The area on the posterior calf just below the popliteal fossa is closed today. He is using his compression pumps twice a day 08/28/2018; patient has no open wound on the right leg. He has a smattering of open areas on the calf with some weeping lymphedema. More problematically than that it looks as though his wraps of slipped down in his usual he has very angry  upper area of edema just below the right medial knee and on the right lateral calf. He has no open area on his feet. The patient is traveling to [facility] next week. I will send him in an antibiotic. We will continue to wrap the right leg. We ordered extremitease stockings for him last week and I plan to transition the right leg to a stocking when he gets home which will be in 10 days time. As usual he is very reluctant to take his pumps with him when he travels 09/07/2018; patient returns from [facility]. He  shows me a picture of his left leg in the mid part of his trip last week with intense fire engine erythema. The picture look bad enough I would have considered sending him to the hospital. Instead he went to the wound care center in [facility]. They did not prescribe him antibiotics but he did take some doxycycline he had leftover from a previous visit. I had given him trimethoprim sulfamethoxazole before he left this did not work according to the patient. This is resulted in some improvement fortunately. He comes back with a large wound on the left posterior calf. Smaller area on the left anterior tibia. Denuded blisters on the dorsal left foot over his toes. Does not have much in the way of wounds on the right leg although he does have a very tender area on the right posterior area just below the popliteal fossa also suggestive of infection. He promises me he is back on his pumps twice a day 09/15/2018; the intense cellulitis in his left lower calf is a lot better. The wound area on the posterior left calf is also so better. However he has reasonably extensive wounds on the dorsal aspect of his second and third toes and the proximal foot just at the base of the toes. There is nothing open on the right leg 09/22/2018; the patient has excellent edema control in his legs bilaterally. He is using his external compression pumps twice a day. He has no open area on the right leg and  only the areas in the left foot dorsally second and third toe area on the left side. He does not have any signs of active cellulitis. 10/06/2018; the patient has good edema control bilaterally. He has no open wound on the right leg. There is a blister in the posterior aspect of his left calf that we had to deal with today. He is using his compression pumps twice a day. There is no signs of active cellulitis. We have been using silver alginate to the wound areas. He still has vulnerable areas on the base of his left first second toes dorsally He has a his extremities stockings and we are going to transition him today into the stocking on the right leg. He is cautioned that he will need to continue to use the compression pumps twice a day. If he notices uncontrolled edema in the right leg he may need to go to 3 times a day. 10/13/2018; the patient came in for a nurse check on Friday he has a large flaccid blister on the right medial calf just below the knee. We unroofed this. He has this and a new area underneath the posterior mid calf which was undoubtedly a blister as well. He also has several small areas on the right which is the area we put his extremities stocking on. 10/19/2018; the patient went to see infectious disease this morning I am not sure if that was a routine follow-up in any case the doxycycline I had given him was discontinued and started on linezolid. He has not started this. It is easy to look at his left calf and the inflammation and think this is cellulitis however he is very tender in the tissue just below the popliteal fossa and I have no doubt that there is infection going on here. He states the problem he is having is that with the compression pumps the edema goes down and then starts walking the wrap falls down. We will see if we can adhere  this. He has 1 or 2 minuscule open areas on the right still areas that are weeping on the posterior left calf, the base of his left second  and third toes 10/26/18; back today in clinic with quite of skin breakdown in his left anterior leg. This may have been infection the area below the popliteal fossa seems a lot better however tremendous epithelial loss on the left anterior mid tibia area over quite inexpensive tissue. He has 2 blisters on the right side but no other open wound here. 10/29/2018; came in urgently to see [place] today and we worked him in for review. He states that the 4 layer compression on the right leg caused pain he had to cut it down to roughly his mid calf this caused swelling above the wrap and he has blisters and skin breakdown today. As a result of the pain he has not been using his pumps. Both legs are a lot more edematous and there is a lot of weeping fluid. 11/02/18; arrives in clinic with continued difficulties in the right leg> left. Leg is swollen and painful. multiple skin blisters and new open areas especially laterally. He has not been using his pumps on the right leg. 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 He is not systemically unwell. 11/09/2018; the patient claims he is being compliant with his pumps. He is finished the doxycycline I gave him last week. Culture I did of the wound on the right lateral leg showed a few very resistant methicillin staph aureus. This was resistant to doxycycline. Nevertheless he states the pain in the leg is a lot better which makes me wonder if the cultured organism was not really what was causing the problem nevertheless this is a very dangerous organism to be culturing out of any wound. His right leg is still a lot larger than the left. He is using an Radio broadcast assistant on this area, he blames a 4-layer compression for causing the original skin breakdown which I doubt is true however I cannot talk him out of it. We have been using silver alginate to all of these areas which were initially blisters 11/16/2018; patient is being  compliant with his external compression pumps at twice a day. Miraculously he arrives in clinic today with absolutely no open wounds. He has better edema control on the left where he has been using 4 layer compression versus wound of wounds on the right and I pointed this out to him. There is no inflammation in the skin in his lower legs which is also somewhat unusual for him. There is no open wounds on the dorsal left foot. He has extremitease stockings at home and I have asked him to bring these in next week. 11/25/18 patient's lower extremity on examination today on the left appears for the most part to be wound free. He does have an open wound on the lateral aspect of the right lower extremity but this is minimal compared to what I've seen in past. He does request that we go ahead and wrap the left leg as well even though there's nothing open just so hopefully it will not reopen in short order. 1/28; patient has superficial open wounds on the right lateral calf left anterior calf and left posterior calf. His edema control is adequate. 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 We have been using silver alginate  as the primary dressing. He claims compliance with his compression pumps 2/4; patient has superficial open wounds on numerous areas of his left calf and again one on the left dorsal foot. The areas on the right lateral calf have healed. The cellulitis that I gave him doxycycline for last week is also resolved this was mostly on the left anterior calf just below the tibial tuberosity. His edema looks fairly well-controlled. He tells me he went to see his primary doctor today and had blood work ordered 2/11; once again he has several open areas on the left calf left tibial area. Most of these are small and appear to have healthy granulation. He does not have anything open on the right. The edema and control in  his thighs is pretty good which is usually a good indication he has been using his pumps as requested. 2/18; he continues to have several small areas on the left calf and left tibial area. Most of these are small healthy granulation. We put him in his stocking on the right leg last week and he arrives with a superficial open area over the right upper tibia and a fairly large area on the right lateral tibia in similar condition. His edema control actually does not look too bad, he claims to be using his compression pumps twice a day 2/25. Continued small areas on the left calf and left tibial area. New areas especially on the right are identified just below the tibial tuberosity and on the right upper tibia itself. There are also areas of weeping edema fluid even without an obvious wound. He does not have a considerable degree of lymphedema but clearly there is more edema here than his skin can handle. He states he is using the pumps twice a day. We have an Unna boot on the right and 4 layer compression on the left. 3/3; he continues to have an area on the right lateral calf and right posterior calf just below the popliteal fossa. There is a fair amount of tenderness around the wound on the popliteal fossa but I did not see any evidence of cellulitis, could just be that the wrap came down and rubbed in this area. ooHe does not have an open area on the left leg however there is an area on the left dorsal foot at the base of the third toe ooWe have been using silver alginate to all wound areas 3/10; he did not have an open area on his left leg last time he was here a week ago. T oday he arrives with a horizontal wound just below the tibial tuberosity and an area on the left lateral calf. He has intense erythema and tenderness in this area. The area is on the right lateral calf and right posterior calf better than last week. We have been using silver alginate as usual 3/18 - Patient returns with 3 small  open areas on left calf, and 1 small open area on right calf, the skin looks ok with no significant erythema, he continues the UNA boot on right and 4 layer compression on left. The right lateral calf wound is closed , the right posterior is small area. we will continue silver alginate to the areas. 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 He is been tolerating the dressing changes without  complication. Fortunately she has made excellent improvement in regard to the overall status of his wounds. Unfortunately every time we cease wrapping him he ends up reopening in causing more significant issues at that point. Again I'm unsure of the best direction to take although I think the lymphedema clinic may be appropriate for him. 02/03/19 on evaluation today patient appears to be doing well in regard to the wounds that we saw him for last week unfortunately he has a new area on the proximal portion of his right medial/posterior lower extremity where the wrap somewhat slowed down and caused swelling and a blister to rub and open. Unfortunately this is the only opening that he has on either leg at this point. 02/17/19 on evaluation today patient's bilateral lower extremities appear to be doing well. He still completely healed in regard to the left lower extremity. In regard to the right lower extremity the area where the wrap and slid down and caused the blister still seems to be slightly open although this is dramatically better than during the last evaluation two weeks ago. I'm very pleased with the way this stands overall. 03/03/19 on evaluation today patient appears to be doing well in regard to his right lower extremity in general although he did have a new blister open this does not appear to be showing any  evidence of active infection at this time. Fortunately there's No fevers, chills, nausea, or vomiting noted at this time. Overall I feel like he is making good progress it does feel like that the right leg will we perform the [employer] seems to do with a bit better than three layer wrap on the left which slid down on him. We may switch to doing bilateral in the book wraps. 5/4; I have not seen Mr. Meeuwsen in quite some time. According to our case manager he did not have an open wound on his left leg last week. He had 1 remaining wound on the right posterior medial calf. He arrives today with multiple openings on the left leg probably were blisters and/or wrap injuries from Unna boots. I do not think the Unna boot's will provide adequate compression on the left. I am also not clear about the frequency he is using the compression pumps. 03/17/19 on evaluation today patient appears to be doing excellent in regard to his lower extremities compared to last week's evaluation apparently. He had gotten significantly worse last week which is unfortunate. The [employer] wrap on the left did not seem to do very well for him at all and in fact it didn't control his swelling significantly enough he had an additional outbreak. Subsequently we go back to the four layer compression wrap on the left. This is good news. At least in that he is doing better and the wound seem to be killing him. He still has not heard anything from the lymphedema clinic. 03/24/19 on evaluation today patient actually appears to be doing much better in regard to his bilateral lower Trinity as compared to last week when I saw him. Fortunately there's no signs of active infection at this time. He has been tolerating the dressing changes without complication. Overall I'm extremely pleased with the progress and appearance in general. 04/07/19 on evaluation today patient appears to be doing well in regard to his bilateral lower extremities. His  swelling is significantly down from where it was previous. With that being said he does have a couple blisters still open at this point but fortunately  nothing that seems to be too severe and again the majority of the larger openings has healed at this time. 04/14/19 on evaluation today patient actually appears to be doing quite well in regard to his bilateral lower extremities in fact I'm not even sure there's anything significantly open at this time at any site. Nonetheless he did have some trouble with these wraps where they are somewhat irritating him secondary to the fact that he has noted that the graph wasn't too close down to the end of this foot in a little bit short as well up to his knee. Otherwise things seem to be doing quite well. 04/21/19 upon evaluation today patient's wound bed actually showed evidence of being completely healed in regard to both lower extremities which is excellent news. There does not appear to be any signs of active infection which is also good news. I'm very pleased in this regard. No fevers, chills, nausea, or vomiting noted at this time. 04/28/19 on evaluation today patient appears to be doing a little bit worse in regard to both lower extremities on the left mainly due to the fact that when he went infection disease the wrap was not wrapped quite high enough he developed a blister above this. On the right he is a small open area of nothing too significant but again this is continuing to give him some trouble he has been were in the Velcro compression that he has at home. 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 He's been tolerating the dressing changes without complication. Fortunately there's no signs of active infection at this time. No fevers, chills, nausea, or vomiting noted at this time. We have been trying to get an appointment with her lymphedema clinic in [facility] but  unfortunately nobody can get them on phone with not been able to even fax information over the patient likewise is not been able to get in touch with them. Overall I'm not sure exactly what's going on here with to reach out again today. 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 Still having a lot of drainage unfortunately. He tells me especially in the left but even on the right. There's no signs of active infection which is good news we've been using so ratcheted up to this point. 05/19/19 on evaluation today patient actually appears to be doing quite well with regard to his left lower extremity which is great news. Fortunately in regard to the right lower extremity has an issues with his wrap and he subsequently did remove this from what I'm understanding. Nonetheless long story short is what he had rewrapped once he removed it subsequently had maggots underneath this wrap whenever he came in for evaluation today. With that being said they were obviously completely cleaned away by the nursing staff. The visit today which is excellent news. However he does appear to potentially have some infection around the right ankle region where the maggots were located as well. He will likely require anabiotic therapy today. 05/26/19 on evaluation today patient actually appears to be doing much better in regard to his bilateral lower extremities. I feel like the infection is under much better control. With that being said there were maggots noted when the wrap was removed yet again today. Again this could have potentially been left over from previous although at this time there does not appear  to be any signs of significant drainage there was obviously on the wrap some drainage as well this contracted gnats or otherwise. Either way I do not see anything that appears to be doing worse in my pinion and in fact I think his drainage  has slowed down quite significantly likely mainly due to the fact to his infection being under better control. 06/02/2019 on evaluation today patient actually appears to be doing well with regard to his bilateral lower extremities there is no signs of active infection at this time which is great news. With that being said he does have several open areas more so on the right than the left but nonetheless these are all significantly better than previously noted. 06/09/2019 on evaluation today patient actually appears to be doing well. His wrap stayed up and he did not cause any problems he had more drainage on the right compared to the left but overall I do not see any major issues at this time which is great news. 06/16/2019 on evaluation today patient appears to be doing excellent with regard to his lower extremities the only area that is open is a new blister that can have opened as of today on the medial ankle on the left. Other than this he really seems to be doing great I see no major issues at this point. 06/23/2019 on evaluation today patient appears to be doing quite well with regard to his bilateral lower extremities. In fact he actually appears to be almost completely healed there is a small area of weeping noted of the right lower extremity just above the ankle. Nonetheless fortunately there is no signs of active infection at this time which is good news. No fevers, chills, nausea, vomiting, or diarrhea. 8/24; the patient arrived for a nurse visit today but complained of very significant pain in the left leg and therefore I was asked to look at this. Noted that he did not have an open area on the left leg last week nevertheless this was wrapped. The patient states that he is not been able to put his compression pumps on the left leg because of the discomfort. He has not been systemically unwell 06/30/2019 on evaluation today patient unfortunately despite being excellent last week is doing much  worse with regard to his left lower extremity today. In fact he had to come in for a nurse on Monday where his left leg had to be rewrapped due to excessive weeping Dr. Leanord Hawking placed him on doxycycline at that point. Fortunately there is no signs of active infection Systemically at this time which is good news. 07/07/2019 in regard to the patient's wounds today he actually seems to be doing well with his right lower extremity there really is nothing open or draining at this point this is great news. Unfortunately the left lower extremity is given him additional trouble at this time. There does not appear to be any signs of active infection nonetheless he does have a lot of edema and swelling noted at this point as well as blistering all of which has led to a much more poor appearing leg at this time compared to where it was 2 weeks ago when it was almost completely healed. Obviously this is a little discouraging for the patient. He is try to contact the lymphedema clinic in [place] he has not been able to get through to them. 07/14/2019 on evaluation today patient actually appears to be doing slightly better with regard to his left lower extremity  ulcers. Overall I do feel like at least at the top of the wrap that we have been placing this area has healed quite nicely and looks much better. The remainder of the leg is showing signs of improvement. Unfortunately in the thigh area he still has an open region on the left and again on the right he has been utilizing just a Band-Aid on an area that also opened on the thigh. Again this is an area that were not able to wrap although we did do an Ace wrap to provide some compression that something that obviously is a little less effective than the compression wraps we have been using on the lower portion of the leg. He does have an appointment with the lymphedema clinic in [facility] on Friday. 07/21/2019 on evaluation today patient appears to be doing  better with regard to his lower extremity ulcers. He has been tolerating the dressing changes without complication. Fortunately there is no signs of active infection at this time. No fevers, chills, nausea, vomiting, or diarrhea. I did receive the paperwork from the physical therapist at the lymphedema clinic in [place]. Subsequently I signed off on that this morning and sent that back to him for further progression with the treatment plan. 07/28/2019 on evaluation today patient appears to be doing very well with regard to his right lower extremity where I do not see any open wounds at this point. Fortunately he is feeling great as far as that is concerned as well. In regard to the left lower extremity he has been having issues with still several areas of weeping and edema although the upper leg is doing better his lower leg still I think is going require the compression wrap at this time. No fevers, chills, nausea, vomiting, or diarrhea. 08/04/2019 on evaluation today patient unfortunately is having new wounds on the right lower extremity. Again we have been using Unna boot wrap on that side. We switched him to using his juxta lite wrap at home. With that being said he tells me he has been using it although his legs extremely swollen and to be honest really does not appear that he has been. I cannot know that for sure however. Nonetheless he has multiple new wounds on the right lower extremity at this time. Obviously we will have to see about getting this rewrapped for him today. 08/11/2019 on evaluation today patient appears to be doing fairly well with regard to his wounds. He has been tolerating the dressing changes including the compression wraps without complication. He still has a lot of edema in his upper thigh regions bilaterally he is supposed to be seeing the lymphedema clinic on the 15th of this month once his wraps arrive for the upper part of his legs. 08/18/2019 on evaluation today  patient appears to be doing well with regard to his bilateral lower extremities at this point. He has been tolerating the dressing changes without complication. Fortunately there is no signs of active infection which is also good news. He does have a couple weeping areas on the first and second toe of the right foot he also has just a small area on the left foot upper leg and a small area on the left lower leg but overall he is doing quite well in my opinion. He is supposed to be getting his wraps shortly in fact tomorrow and then subsequently is seeing the lymphedema clinic next Wednesday on the 21st. Of note he is also leaving on the 25th to  go on vacation for a week to the beach. For that reason and since there is some uncertainty about what there can be doing at lymphedema clinic next Wednesday I am get a make an appointment for next Friday here for [place] to see what we need to do for him prior to him leaving for vacation. 10/23; patient arrives in considerable pain predominantly in the upper posterior calf just distal to the popliteal fossa also in the wound anteriorly above the major wound. This is probably cellulitis and he has had this recurrently in the past. He has no open wound on the right side and he has had an Radio broadcast assistant in that area. Finally I note that he has an area on the left posterior calf which by enlarge is mostly epithelialized. This protrudes beyond the borders of the surrounding skin in the setting of dry scaly skin and lymphedema. The patient is leaving for [facility] [name] on Sunday. Per his longstanding pattern, he will not take his compression pumps with him predominantly out of fear that they will be stolen. He therefore asked that we put a Unna boot back on the right leg. He will also contact the wound care center in [facility] to see if they can change his dressing in the mid week. 11/3; patient returned from his vacation to [facility]. He was seen on 1 occasion at their  wound care center. They did a 2 layer compression system as they did not have our 4-layer wrap. I am not completely certain what they put on the wounds. They did not change the Unna boot on the right. The patient is also seeing a lymphedema specialist physical therapist in [place]. It appears that he has some compression sleeve for his thighs which indeed look quite a bit better than I am used to seeing. He pumps over these with his external compression pumps. 11/10; the patient has a new wound on the right medial thigh otherwise there is no open areas on the right. He has an area on the left leg posteriorly anteriorly and medially and an area over the left second toe. We have been using silver alginate. He thinks the injury on his thigh is secondary to friction from the compression sleeve he has. 11/17; the patient has a new wound on the right medial thigh last week. He thinks this is because he did not have a underlying stocking for his thigh juxta lite apparatus. He now has this. The area is fairly large and somewhat angry but I do not think he has underlying cellulitis. ooHe has a intact blister on the right anterior tibial area. ooSmall wound on the right great toe dorsally ooSmall area on the medial left calf. 11/30; the patient does not have any open areas on his right leg and we did not take his juxta lite stocking off. However he states that on Friday his compression wrap fell down lodging around his upper mid calf area. As usual this creates a lot of problems for him. He called urgently today to be seen for a nurse visit however the nurse visit turned into a provider visit because of extreme erythema and pain in the left anterior tibia extending laterally and posteriorly. The area that is problematic is extensive 10/06/2019 upon evaluation today patient actually appears to be doing poorly in regard to his left lower extremity. He Dr. Leanord Hawking did place him on doxycycline this past  Monday apparently due to the fact that he was doing much worse  in regard to this left leg. Fortunately the doxycycline does seem to be helping. Unfortunately we are still having a very difficult time getting his edema under any type of control in order to anticipate discharge at some point. The only way were really able to control his lymphedema really is with compression wraps and that has only even seemingly temporary. He has been seeing a lymphedema clinic they are trying to help in this regard but still this has been somewhat frustrating in general for the patient. 10/13/19 on evaluation today patient appears to be doing excellent with regard to his right lower extremity as far as the wounds are concerned. His swelling is still quite extensive unfortunately. He is still having a lot of drainage from the thigh areas bilaterally which is unfortunate. He's been going to lymphedema clinic but again he still really does not have this edema under control as far as his lower extremities are concern. With regard to his left lower extremity this seems to be improving and I do believe the doxycycline has been of benefit for him. He is about to complete the doxycycline. 10/20/2019 on evaluation today patient appears to be doing poorly in regard to his bilateral lower extremities. More in the right thigh he has a lot of irritation at this site unfortunately. In regard to the left lower extremity the wrap was not quite as high it appears and does seem to have caused him some trouble as well. Fortunately there is no evidence of systemic infection though he does have some blue-green drainage which has me concerned for the possibility of Pseudomonas. He tells me he is previously taking Cipro without complications and he really does not care for Levaquin however due to some of the side effects he has. He is not allergic to any medications specifically antibiotics that were aware of. 10/27/2019 on evaluation today  patient actually does appear to be for the most part doing better when compared to last week's evaluation. With that being said he still has multiple open wounds over the bilateral lower extremities. He actually forgot to start taking the Cipro and states that he still has the whole bottle. He does have several new blisters on left lower extremity today I think I would recommend he go ahead and take the Cipro based on what I am seeing at this point. 12/30-Patient comes at 1 week visit, 4 layer compression wraps on the left and Unna boot on the right, primary dressing Xtrasorb and silver alginate. Patient is taking his Cipro and has a few more days left probably 5-6, and the legs are doing better. He states he is using his compressions devices which I believe he has 11/10/2019 on evaluation today patient actually appears to be much better than last time I saw him 2 weeks ago. His wounds are significantly improved and overall I am very pleased in this regard. Fortunately there is no signs of active infection at this time. He is just a couple of days away from completing Cipro. Overall his edema is much better he has been using his lymphedema pumps which I think is also helping at this point. 11/17/2019 on evaluation today patient appears to be doing excellent in regard to his wounds in general. His legs are swollen but not nearly as much as they have been in the past. Fortunately he is tolerating the compression wraps without complication. No fevers, chills, nausea, vomiting, or diarrhea. He does have some erythema however in the distal portion of his  right lower extremity specifically around the forefoot and toes there is a little bit of warmth here as well. 11/24/2019 on evaluation today patient appears to be doing well with regard to his right lower extremity I really do not see any open wounds at this point. His left lower extremity does have several open areas and his right medial thigh also is open.  Other than this however overall the patient seems to be making good progress and I am very pleased at this point. 12/01/2019 on evaluation today patient appears to be doing poorly at this point in regard to his left lower extremity has several new blisters despite the fact that we have him in compression wraps. In fact he had a 4-layer compression wrap, his upper thigh wrapped from lymphedema clinic, and a juxta light over top of the 4 layer compression wrap the lymphedema clinic applied and despite all this he still develop blisters underneath. Obviously this does have me concerned about the fact that unfortunately despite what we are doing to try to get wounds healed he continues to have new areas arise I do not think he is ever good to be at the point where he can realistically just use wraps at home to keep things under control. Typically when we heal him it takes about 1-2 days before he is back in the clinic with severe breakdown and blistering of his lower extremities bilaterally. This is happened numerous times in the past. Unfortunately I think that we may need some help as far as overall fluid overload to kind of limit what we are seeing and get things under better control. 12/08/2019 on evaluation today patient presents for follow-up concerning his ongoing bilateral lower extremity edema. Unfortunately he is still having quite a bit of swelling the compression wraps are controlling this to some degree but he did see Dr. [name] his cardiologist I do have that available for review today as far as the appointment was concerned that was on 12/06/2019. Obviously that she has been 2 days ago. The patient states that he is only been taking the Lasix 80 mg 1 time a day he had told me previously he was taking this twice a day. Nonetheless Dr. [name] recommended this be up to 80 mg 2 times a day for the patient as he did appear to be fluid overloaded. With that being said the patient states he did this  yesterday and he was unable to go anywhere or do anything due to the fact that he was constantly having to urinate. Nonetheless I think that this is still good to be something that is important for him as far as trying to get his edema under control at all things that he is going to be able to just expect his wounds to get under control and things to be better without going through at least a period of time where he is trying to stabilize his fluid management in general and I think increasing the Lasix is likely the first step here. It was also mentioned the possibility that the patient may require metolazone. With that being said he wanted to have the patient take Lasix twice a day first and then reevaluating 2 months to see where things stand. 12/15/2019 upon evaluation today patient appears to be doing regard to his legs although his toes are showing some signs of weeping especially on the left at this point to some degree on the right. There does not appear to be any signs of  active infection and overall I do feel like the compression wraps are doing well for him but he has not been able to take the Lasix at home and the increased dose that Dr. [name] recommended. He tells me that just not go to be feasible for him. Nonetheless I think in this case he should probably send a message to Dr. [name] in order to discuss options from the standpoint of possible admission to get the fluid off or otherwise going forward. 12/22/2019 upon evaluation today patient appears to be doing fairly well with regard to his lower extremities at this point. In fact he would be doing excellent if it was not for the fact that his right anterior thigh apparently had an allergic reaction to adhesive tape that he used. The wound itself that we have been monitoring actually appears to be healed. There is a lot of irritation at this point. 12/29/2019 upon evaluation today patient appears to be doing well in regard to his lower  extremities. His left medial thigh is open and somewhat draining today but this is the only region that is open the right has done much better with the treatment utilizing the steroid cream that I prescribed for him last week. Overall I am pleased in that regard. Fortunately there is no signs of active infection at this time. No fevers, chills, nausea, vomiting, or diarrhea. 01/05/2020 upon evaluation today patient appears to be doing more poorly in regard to his right lower extremity at this point upon evaluation today. Unfortunately he continues to have issues in this regard and I think the biggest issue is controlling his edema. This obviously is not very well controlled at this point is been recommended that he use the Lasix twice a day but he has not been able to do that. Unfortunately I think this is leading to an issue where honestly he is not really able to effectively control his edema and therefore the wounds really are not doing significantly better. I do not think that he is going to be able to keep things under good control unless he is able to control his edema much better. I discussed this again in great detail with him today. 01/12/2020 good news is patient actually appears to be doing quite well today at this point. He does have an appointment with lymphedema clinic tomorrow. His legs appear healed and the toe on the left is almost completely healed. In general I am very pleased with how things stand at this point. 01/19/2020 upon evaluation today patient appears to actually be doing well in regard to his lower extremities there is nothing open at this point. Fortunately he has done extremely well more recently. Has been seeing lymphedema clinic as well. With that being said he has Velcro wraps for his lower legs as well as his upper legs. The only wound really is on his toe which is the right great toe and this is barely anything even there. With all that being said I think it is good to  be appropriate today to go ahead and switch him over to the Velcro compression wraps. 01/26/2020 upon evaluation today patient appears to be doing worse with regard to his lower extremities after last week switch him to Velcro compression wraps. Unfortunately he lasted less than 24 hours he did not have the sock portion of his Velcro wrap on the left leg and subsequently developed a blister underneath the Velcro portion. Obviously this is not good and not what we were  looking for at this point. He states the lymphedema clinic did tell him to wear the wrap for 23 hours and take him off for 1 I am okay with that plan but again right now we got a get things back under control again he may have some cellulitis noted as well. 02/02/2020 upon evaluation today patient unfortunately appears to have several areas of blistering on his bilateral lower extremities today mainly on the feet. His legs do seem to be doing somewhat better which is good news. Fortunately there is no evidence of active infection at this time. No fevers, chills, nausea, vomiting, or diarrhea. 02/16/2020 upon evaluation today patient appears to be doing well at this time with regard to his legs. He has a couple weeping areas on his toes but for the most part everything is doing better and does appear to be sealed up on his legs which is excellent news. We can continue with wrapping him at this point as he had every time we discontinue the wraps he just breaks out with new wounds. There is really no point in is going forward with this at this point. 03/08/2020 upon evaluation today patient actually appears to be doing quite well with regard to his lower extremity ulcers. He has just a very superficial and really almost nonexistent blister on the left lower extremity he has in general done very well with the compression wraps. With that being said I do not see any signs of infection at this time which is good news. 03/29/2020 upon evaluation  today patient appears to be doing well with regard to his wounds currently except for where he had several new areas that opened up due to some of the wrap slipping and causing him trouble. He states he did not realize they had slipped. Nonetheless he has a 1 area on the right and 3 new areas on the left. Fortunately there is no signs of active infection at this time which is great news. 04/05/2020 upon evaluation today patient actually appears to be doing quite well in general in regard to his legs currently. Fortunately there is no signs of active infection at this time. No fevers, chills, nausea, vomiting, or diarrhea. He tells me next week that he will actually be seen in the lymphedema clinic on Thursday at 10 AM I see him on Wednesday next week. 04/12/2020 upon evaluation today patient appears to be doing very well with regard to his lower extremities bilaterally. In fact he does not appear to have any open wounds at this point which is good news. Fortunately there is no signs of active infection at this time. No fevers, chills, nausea, vomiting, or diarrhea. 04/19/2020 upon evaluation today patient appears to be doing well with regard to his wounds currently on the bilateral lower extremities. There does not appear to be any signs of active infection at this time. Fortunately there is no evidence of systemic infection and overall very pleased at this point. Nonetheless after I held him out last week he literally had blisters the next morning already which swelled up with him being right back here in the clinic. Overall I think that he is just not can be able to be discharged with his legs the way they are he is much to volume overloaded as far as fluid is concerned and that was discussed with him today of also discussed this but should try the clinic nurse manager as well as Dr. Leanord Hawking. 04/26/2020 upon evaluation today patient appears to  be doing better with regard to his wounds currently. He is making  some progress and overall swelling is under good control with the compression wraps. Fortunately there is no evidence of active infection at this time. 05/10/2020 on evaluation today patient appears to be doing overall well in regard to his lower extremities bilaterally. He is Tolerating the compression wraps without complication and with what we are seeing currently I feel like that he is making excellent progress. There is no signs of active infection at this time. 05/24/2020 upon evaluation today patient appears to be doing well in regard to his legs. The swelling is actually quite a bit down compared to where it has been in the past. Fortunately there is no sign of active infection at this time which is also good news. With that being said he does have several wounds on his toes that have opened up at this point. 05/31/2020 upon evaluation today patient appears to be doing well with regard to his legs bilaterally where he really has no significant fluid buildup at this point overall he seems to be doing quite well. Very pleased in this regard. With regard to his toes these also seem to be drying up which is excellent. We have continue to wrap him as every time we tried as a transition to the juxta light wraps things just do not seem to get any better. 06/07/2020 upon evaluation today patient appears to be doing well with regard to his right leg at this point. Unfortunately left leg has a lot of blistering he tells me the wrap started to slide down on him when he tried to put his other Velcro wrap over top of it to help keep things in order but nonetheless still had some issues. 06/14/2020 on evaluation today patient appears to be doing well with regard to his lower extremity ulcers and foot ulcers at this point. I feel like everything is actually showing signs of improvement which is great news overall there is no signs of active infection at this time. No fevers, chills, nausea, vomiting,  or diarrhea. 06/21/2020 on evaluation today patient actually appears to be doing okay in regard to his wounds in general. With that being said the biggest issue I see is on his right foot in particular the first and second toe seem to be doing a little worse due to the fact this is staying very wet. I think he is probably getting need to change out his dressings a couple times in between each week when we see him in regard to his toes in order to keep this drier based on the location and how this is proceeding. 06/28/2020 on evaluation today patient appears to be doing a little bit more poorly overall in regard to the appearance of the skin I am actually somewhat concerned about the possibility of him having a little bit of an infection here. We discussed the course of potentially giving him a doxycycline prescription which he is taken previously with good result. With that being said I do believe that this is potentially mild and at this point easily fixed. I just do not want anything to get any worse. 07/12/2020 upon evaluation today patient actually appears to be making some progress with regard to his legs which is great news there does not appear to be any evidence of active infection. Overall very pleased with where things stand. 07/26/2020 upon evaluation today patient appears to be doing well with regard to his leg ulcers and  toe ulcers at this point. He has been tolerating the compression wraps without complication overall very pleased in this regard. 08/09/2020 upon evaluation today patient appears to be doing well with regard to his lower extremities bilaterally. Fortunately there is no signs of active infection overall I am pleased with where things stand. 08/23/2020 on evaluation today patient appears to be doing well with regard to his wound. He has been tolerating the dressing changes without complication. Fortunately there is no signs of active infection at this time. Overall his legs seem  to be doing quite well which is great news and I am very pleased in that regard. No fevers, chills, nausea, vomiting, or diarrhea. 09/13/2020 upon evaluation today patient appears to be doing okay in regard to his lower extremities. He does have a fairly large blister on the right leg which I did remove the blister tissue from today so we can get this to dry out other than that however he seems to be doing quite well. There is no signs of active infection at this time. 09/27/2020 upon evaluation today patient appears to actually be doing some better in regard to his right leg. Fortunately signs of active infection at this time which is great news. No fevers, chills, nausea, vomiting, or diarrhea. 10/04/2020 upon evaluation today patient actually appears to be showing signs of improvement which is great news with regard to his leg ulcers. Fortunately there is no signs of active infection which is great news he is still taking the antibiotics currently. No fevers, chills, nausea, vomiting, or diarrhea. 10/18/2020 on evaluation today patient appears to be doing well with regard to his legs currently. He has been tolerating the dressing changes including the wraps without complication. Fortunately there is no signs of active infection at this time. No fevers, chills, nausea, vomiting, or diarrhea. 10/25/2020 upon evaluation today patient appears to be doing decently well in regard to his wounds currently. He has been tolerating the dressing changes without complication. Overall I feel like he is making good progress albeit slow. Again this is something we can have to continue to wrap for some time to come most likely. 11/08/2020 upon evaluation today patient appears to be doing well with regard to his wounds currently. He has been tolerating the dressing changes without complication is not currently on any antibiotics and he does not appear to show any signs of infection. He does continue to have a lot of  drainage on the right leg not too severe but nonetheless this is very scattered. On the left leg this is looking to be much improved overall. 11/15/2020 upon evaluation today patient appears to be doing better with regard to his legs bilaterally. Especially the right leg which was much more significant last week. There does not appear to be any signs of active infection which is great news. No fevers, chills, nausea, vomiting, or diarrhea. 11/23/2019 upon evaluation today patient appears to be doing poorly still in regard to his lower extremities bilaterally. Unfortunately his right leg in particular appears to be doing much more poorly there is no signs really of infection this is not warm to touch but he does have a lot of drainage and weeping unfortunately. With that reason I do believe that we may need to initiate some treatment here to try to help calm down some of the swelling of the right leg. I think switching to a 4-layer compression wrap would be beneficial here. The patient is in agreement with giving this a  try. 11/29/2020 upon evaluation today patient appears to be doing well currently in regard to his leg ulcers. I feel like the right leg is doing better he still has a lot of drainage but we do see some improvement here. The 4-layer compression wrap I think was helpful. Objective Constitutional Obese and well-hydrated in no acute distress. Vitals Time Taken: 11:08 AM, Height: 70 in, Weight: 380.2 lbs, BMI: 54.5, Temperature: 98.0 F, Pulse: 63 bpm, Respiratory Rate: 20 breaths/min, Blood Pressure: 169/63 mmHg, Capillary Blood Glucose: 190 mg/dl. Respiratory normal breathing without difficulty. Psychiatric this patient is able to make decisions and demonstrates good insight into disease process. Alert and Oriented x 3. pleasant and cooperative. General Notes: Patient's wound bed showed signs of good granulation epithelization in general. There does not appear to be any signs of active  infection right now which is great news. Overall I am pleased and hopeful he will continue to make good progress toward closure. Integumentary (Hair, Skin) Wound #185 status is Open. Original cause of wound was Blister. The wound is located on the Right,Circumferential Lower Leg. The wound measures 9.5cm length x 16.2cm width x 0.1cm depth; 120.873cm^2 area and 12.087cm^3 volume. There is Fat Layer (Subcutaneous Tissue) exposed. There is no tunneling or undermining noted. There is a large amount of serous drainage noted. The wound margin is distinct with the outline attached to the wound base. There is large (67-100%) red, pink granulation within the wound bed. There is a small (1-33%) amount of necrotic tissue within the wound bed including Adherent Slough. Wound #189 status is Healed - Epithelialized. Original cause of wound was Trauma. The wound is located on the Right,Dorsal Forearm. The wound measures 0cm length x 0cm width x 0cm depth; 0cm^2 area and 0cm^3 volume. There is Fat Layer (Subcutaneous Tissue) exposed. There is no tunneling or undermining noted. There is a none present amount of drainage noted. The wound margin is distinct with the outline attached to the wound base. There is no granulation within the wound bed. There is a large (67-100%) amount of necrotic tissue within the wound bed including Eschar. General Notes: scabbed over. Wound #190 status is Open. Original cause of wound was Gradually Appeared. The wound is located on the Right T Second. The wound measures 0.2cm oe length x 0.2cm width x 0.1cm depth; 0.031cm^2 area and 0.003cm^3 volume. There is no tunneling or undermining noted. There is a large amount of serosanguineous drainage noted. The wound margin is distinct with the outline attached to the wound base. There is large (67-100%) red, pink granulation within the wound bed. There is no necrotic tissue within the wound bed. 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 There was a Four Layer Compression Therapy Procedure by Shawn Stall, RN. Post procedure Diagnosis Wound #185: Same as Pre-Procedure There was a Four Layer Compression Therapy Procedure by Shawn Stall, RN. Post procedure Diagnosis Wound #: Same as Pre-Procedure Plan 1. I Minna suggest we continue with 4-layer compression wraps bilaterally. 2. We will use silver alginate dressing to the open wound  areas and zinc to help protect the good skin. 3. He should continue to elevate his legs much as possible try to keep edema under control and using his lymphedema pumps is also something good to continue with at this point. We will see patient back for reevaluation in 1 week here in the clinic. If anything worsens or changes patient will contact our office for additional recommendations. Electronic Signature(s) Signed: 11/29/2020 11:48:31 AM By: Lenda Kelp PA-C Entered By: Lenda Kelp on 11/29/2020 11:48:30 -------------------------------------------------------------------------------- SuperBill Details Patient Name: Date of Service: [name], [name] 11/29/2020 Medical Record Number: [id number] Patient Account Number: [id number] Date of Birth/Sex: Treating RN: 03/26/1951 (70 y.o. 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

## 2020-11-30 NOTE — Progress Notes (Signed)
ASBERRY, LASCOLA ([id number]) Visit Report for 11/29/2020 Arrival Information Details Patient Name: Date of Service: [name], [name] 11/29/2020 10:30 A M Medical Record Number: [id number] Patient Account Number: [id number] Date of Birth/Sex: Treating RN: 05-18-1951 (70 y.o. [name] Primary Care Taelor Waymire: [name] Other Clinician: Referring Koa Zoeller: Treating Emillee Talsma/Extender: [name] in Treatment: 253 Visit Information History Since Last Visit Added or deleted any medications: No Patient Arrived: Wheel Chair Any new allergies or adverse reactions: No Arrival Time: 11:07 Had a fall or experienced change in No Accompanied By: self activities of daily living that may affect Transfer Assistance: None risk of falls: Patient Identification Verified: Yes Signs or symptoms of abuse/neglect since last visito No Secondary Verification Process Completed: Yes Hospitalized since last visit: No Patient Requires Transmission-Based Precautions: No Implantable device outside of the clinic excluding No Patient Has Alerts: Yes cellular tissue based products placed in the center since last visit: Has Dressing in Place as Prescribed: Yes Pain Present Now: No Electronic Signature(s) Signed: 11/30/2020 3:17:01 PM By: Sandre Kitty Entered By: Sandre Kitty on 11/29/2020 11:08:08 -------------------------------------------------------------------------------- Compression Therapy Details Patient Name: Date of Service: [name], [name] 11/29/2020 10:30 A M Medical Record Number: [id number] Patient Account Number: [id number] Date of Birth/Sex: Treating RN: 06/16/1951 (71 y.o. [name] Primary Care Sangeeta Youse: [name] Other Clinician: Referring Chau Savell: Treating Jenise Iannelli/Extender: [name] in Treatment: 253 Compression Therapy Performed for Wound Assessment: Wound #185 Right,Circumferential Lower  Leg Performed By: Clinician Deon Pilling, RN Compression Type: Four Layer Post Procedure Diagnosis Same as Pre-procedure Electronic Signature(s) Signed: 11/29/2020 6:09:51 PM By: Baruch Gouty RN, BSN Entered By: Baruch Gouty on 11/29/2020 11:46:30 -------------------------------------------------------------------------------- Compression Therapy Details Patient Name: Date of Service: [name], [name] 11/29/2020 10:30 A M Medical Record Number: [id number] Patient Account Number: [id number] Date of Birth/Sex: Treating RN: [birth year] (70 y.o. [name] Primary Care Bentlie Catanzaro: [name] Other Clinician: Referring Manolo Bosket: Treating Carline Dura/Extender: [name] in Treatment: 253 Compression Therapy Performed for Wound Assessment: NonWound Condition Lymphedema - Left Leg Performed By: Clinician Deon Pilling, RN Compression Type: Four Layer Post Procedure Diagnosis Same as Pre-procedure Electronic Signature(s) Signed: 11/29/2020 6:09:51 PM By: Baruch Gouty RN, BSN Entered By: Baruch Gouty on 11/29/2020 11:46:54 -------------------------------------------------------------------------------- Encounter Discharge Information Details Patient Name: Date of Service: [name], [name] 11/29/2020 10:30 A M Medical Record Number: [id number] Patient Account Number: [id number] Date of Birth/Sex: Treating RN: May 20, 1951 (70 y.o. [name] Primary Care Lynda Wanninger: [name] Other Clinician: Referring Theodore Rahrig: Treating Evalin Shawhan/Extender: [name] in Treatment: [phone] Encounter Discharge Information Items Discharge Condition: Stable Ambulatory Status: Walker Discharge Destination: Home Transportation: Private Auto Accompanied By: self Schedule Follow-up Appointment: Yes Clinical Summary of Care: Electronic Signature(s) Signed: 11/29/2020 6:11:14 PM By: Deon Pilling Entered By: Deon Pilling on  11/29/2020 12:32:22 -------------------------------------------------------------------------------- Lower Extremity Assessment Details Patient Name: Date of Service: [name], GLOMSKI. 11/29/2020 10:30 A M Medical Record Number: [id number] Patient Account Number: [id number] Date of Birth/Sex: Treating RN: [birth year] (70 y.o. [name] Primary Care Lashina Milles: [name] Other Clinician: Referring Baer Hinton: Treating Stefano Trulson/Extender: [name] in Treatment: 253 Edema Assessment Assessed: [name]: Yes] [name]: Yes] Edema: [Left: Yes] [Right: Yes] Calf Left: Right: Point of Measurement: 25 cm From Medial Instep 41 cm 41 cm Ankle Left: Right: Point of Measurement: 9 cm From Medial Instep 28 cm 30 cm Electronic Signature(s) Signed: 11/29/2020 6:11:14  PM By: Deon Pilling Entered By: Deon Pilling on 11/29/2020 11:13:46 -------------------------------------------------------------------------------- Multi-Disciplinary Care Plan Details Patient Name: Date of Service: [facility], Sebastin J. 11/29/2020 10:30 A M Medical Record Number: [id number] Patient Account Number: [id number] Date of Birth/Sex: Treating RN: 1951-07-27 (70 y.o. [name] Primary Care Angalena Cousineau: [name] Other Clinician: Referring Jamaiyah Pyle: Treating Ilija Maxim/Extender: [name] in Treatment: [phone] Active Inactive Venous Leg Ulcer Nursing Diagnoses: Actual venous Insuffiency (use after diagnosis is confirmed) Goals: Patient will maintain optimal edema control Date Initiated: 09/10/2016 Target Resolution Date: 12/20/2020 Goal Status: Active Verify adequate tissue perfusion prior to therapeutic compression application Date Initiated: 09/10/2016 Date Inactivated: 11/28/2016 Goal Status: Met Interventions: Assess peripheral edema status every visit. Compression as ordered Provide education on venous insufficiency Notes: Electronic  Signature(s) Signed: 11/29/2020 6:09:51 PM By: Baruch Gouty RN, BSN Entered By: Baruch Gouty on 11/29/2020 11:44:37 -------------------------------------------------------------------------------- Pain Assessment Details Patient Name: Date of Service: [name], [name] 11/29/2020 10:30 A M Medical Record Number: [id number] Patient Account Number: [id number] Date of Birth/Sex: Treating RN: 22-Jun-1951 (70 y.o. [name] Primary Care Emmalyne Giacomo: [name] Other Clinician: Referring Demauri Advincula: Treating Audianna Landgren/Extender: [name] in Treatment: [phone] Active Problems Location of Pain Severity and Description of Pain Patient Has Paino No Site Locations Pain Management and Medication Current Pain Management: Electronic Signature(s) Signed: 11/29/2020 6:09:51 PM By: Baruch Gouty RN, BSN Signed: 11/30/2020 3:17:01 PM By: Sandre Kitty Entered By: Sandre Kitty on 11/29/2020 11:08:50 -------------------------------------------------------------------------------- Patient/Caregiver Education Details Patient Name: Date of Service: Kevin Heide Spark 1/26/2022andnbsp10:30 A M Medical Record Number: [id number] Patient Account Number: [id number] Date of Birth/Gender: Treating RN: 1951/06/30 (70 y.o. [name] Primary Care Physician: [name] Other Clinician: Referring Physician: Treating Physician/Extender: [name] in Treatment: [phone] Education Assessment Education Provided To: Patient Education Topics Provided Infection: Methods: Explain/Verbal Responses: Reinforcements needed, State content correctly Venous: Methods: Explain/Verbal Responses: Reinforcements needed, State content correctly Electronic Signature(s) Signed: 11/29/2020 6:09:51 PM By: Baruch Gouty RN, BSN Entered By: Baruch Gouty on 11/29/2020  11:45:39 -------------------------------------------------------------------------------- Wound Assessment Details Patient Name: Date of Service: [name], [name] 11/29/2020 10:30 A M Medical Record Number: [id number] Patient Account Number: [id number] Date of Birth/Sex: Treating RN: [birth year] (70 y.o. [name] Primary Care Bently Wyss: [name] Other Clinician: Referring Jonmarc Bodkin: Treating Gussie Towson/Extender: [name] in Treatment: 253 Wound Status Wound Number: [id number] Primary Venous Leg Ulcer Etiology: Wound Location: Right, Circumferential Lower Leg Secondary Lymphedema Wounding Event: Blister Etiology: Date Acquired: 09/13/2020 Wound Open Weeks Of Treatment: 10 Status: Clustered Wound: Yes Comorbid Chronic sinus problems/congestion, Arrhythmia, Hypertension, History: Peripheral Arterial Disease, Type II Diabetes, History of Burn, Gout, Confinement Anxiety Wound Measurements Length: (cm) 9.5 Width: (cm) 16.2 Depth: (cm) 0.1 Clustered Quantity: 1 Area: (cm) 120.873 Volume: (cm) 12.087 % Reduction in Area: -29.3% % Reduction in Volume: -29.3% Epithelialization: Small (1-33%) Tunneling: No Undermining: No Wound Description Classification: Full Thickness Without Exposed Support Structures Wound Margin: Distinct, outline attached Exudate Amount: Large Exudate Type: Serous Exudate Color: amber Foul Odor After Cleansing: No Slough/Fibrino Yes Wound Bed Granulation Amount: Large (67-100%) Exposed Structure Granulation Quality: Red, Pink Fascia Exposed: No Necrotic Amount: Small (1-33%) Fat Layer (Subcutaneous Tissue) Exposed: Yes Necrotic Quality: Adherent Slough Tendon Exposed: No Muscle Exposed: No Joint Exposed: No Bone Exposed: No Treatment Notes Wound #185 (Lower Leg) Wound Laterality: Right, Circumferential Cleanser Peri-Wound Care Ketoconazole Cream 2% Discharge Instruction: Apply Ketoconazole mixed with TCA  and zinc to lower leg Triamcinolone 15 (g) Discharge Instruction: Use triamcinolone 15 (g) as directed Zinc Oxide Ointment 30g tube Discharge Instruction: Apply Zinc Oxide to periwound with each dressing change Topical Primary Dressing KerraCel Ag Gelling Fiber Dressing, 4x5 in (silver alginate) Discharge Instruction: Apply silver alginate to wound bed as instructed Secondary Dressing ABD Pad, 8x10 Discharge Instruction: Apply over primary dressing as directed. Zetuvit Plus 4x8 in Discharge Instruction: Apply over primary dressing as needed CarboFLEX Odor Control Dressing, 4x4 in Discharge Instruction: Apply over primary dressing as directed. Secured With Compression Wrap FourPress (4 layer compression wrap) Discharge Instruction: Apply four layer compression unna layer at top to secure Compression Stockings Add-Ons Electronic Signature(s) Signed: 11/29/2020 6:09:51 PM By: Baruch Gouty RN, BSN Signed: 11/29/2020 6:11:14 PM By: Deon Pilling Entered By: Deon Pilling on 11/29/2020 11:14:14 -------------------------------------------------------------------------------- Wound Assessment Details Patient Name: Date of Service: [name], [name] 11/29/2020 10:30 A M Medical Record Number: [id number] Patient Account Number: [id number] Date of Birth/Sex: Treating RN: 12/08/1950 (70 y.o. [name] Primary Care Sinda Leedom: [name] Other Clinician: Referring Zedric Deroy: Treating Hanni Milford/Extender: [name] in Treatment: 253 Wound Status Wound Number: [id number] Primary Abrasion Etiology: Wound Location: Right, Dorsal Forearm Wound Healed - Epithelialized Wounding Event: Trauma Status: Date Acquired: 10/18/2020 Comorbid Chronic sinus problems/congestion, Arrhythmia, Hypertension, Weeks Of Treatment: 5 History: Peripheral Arterial Disease, Type II Diabetes, History of Burn, Clustered Wound: No Gout, Confinement Anxiety Photos Photo Uploaded  By: Mikeal Hawthorne on 11/30/2020 13:51:37 Wound Measurements Length: (cm) Width: (cm) Depth: (cm) Area: (cm) Volume: (cm) 0 % Reduction in Area: 100% 0 % Reduction in Volume: 100% 0 Epithelialization: Large (67-100%) 0 Tunneling: No 0 Undermining: No Wound Description Classification: Full Thickness Without Exposed Support Structures Wound Margin: Distinct, outline attached Exudate Amount: None Present Foul Odor After Cleansing: No Slough/Fibrino No Wound Bed Granulation Amount: None Present (0%) Exposed Structure Necrotic Amount: Large (67-100%) Fascia Exposed: No Necrotic Quality: Eschar Fat Layer (Subcutaneous Tissue) Exposed: Yes Tendon Exposed: No Muscle Exposed: No Joint Exposed: No Bone Exposed: No Assessment Notes scabbed over. Treatment Notes Wound #189 (Forearm) Wound Laterality: Dorsal, Right Cleanser Peri-Wound Care Topical Primary Dressing Secondary Dressing Secured With Compression Wrap Compression Stockings Add-Ons Electronic Signature(s) Signed: 11/29/2020 6:09:51 PM By: Baruch Gouty RN, BSN Entered By: Baruch Gouty on 11/29/2020 11:47:23 -------------------------------------------------------------------------------- Wound Assessment Details Patient Name: Date of Service: [name], [name] 11/29/2020 10:30 A M Medical Record Number: [id number] Patient Account Number: [id number] Date of Birth/Sex: Treating RN: September 23, 1951 (70 y.o. [name] Primary Care Jordi Lacko: [name] Other Clinician: Referring Kalyn Hofstra: Treating Aamani Moose/Extender: [name] in Treatment: 253 Wound Status Wound Number: 190 Primary Diabetic Wound/Ulcer of the Lower Extremity Etiology: Wound Location: Right T Second oe Wound Open Wounding Event: Gradually Appeared Status: Date Acquired: 11/10/2020 Comorbid Chronic sinus problems/congestion, Arrhythmia, Hypertension, Weeks Of Treatment: 2 History: Peripheral Arterial  Disease, Type II Diabetes, History of Burn, Clustered Wound: No Gout, Confinement Anxiety Wound Measurements Length: (cm) 0.2 Width: (cm) 0.2 Depth: (cm) 0.1 Area: (cm) 0.031 Volume: (cm) 0.003 % Reduction in Area: 98% % Reduction in Volume: 98.1% Epithelialization: Large (67-100%) Tunneling: No Undermining: No Wound Description Classification: Grade 2 Wound Margin: Distinct, outline attached Exudate Amount: Large Exudate Type: Serosanguineous Exudate Color: red, brown Foul Odor After Cleansing: No Slough/Fibrino No Wound Bed Granulation Amount: Large (67-100%) Exposed Structure Granulation Quality: Red, Pink Fascia Exposed: No Necrotic Amount: None Present (0%) Fat Layer (Subcutaneous Tissue) Exposed: No Tendon  Exposed: No Muscle Exposed: No Joint Exposed: No Bone Exposed: No Treatment Notes Wound #190 (Toe Second) Wound Laterality: Right Cleanser Peri-Wound Care Topical Primary Dressing KerraCel Ag Gelling Fiber Dressing, 2x2 in (silver alginate) Discharge Instruction: Apply silver alginate to wound bed as instructed Secondary Dressing Woven Gauze Sponges 2x2 in Discharge Instruction: Apply over primary dressing as directed. Secured With Conforming Stretch Gauze Bandage, Sterile 2x75 (in/in) Discharge Instruction: Secure with stretch gauze as directed. Compression Wrap Compression Stockings Add-Ons Electronic Signature(s) Signed: 11/29/2020 6:09:51 PM By: Baruch Gouty RN, BSN Signed: 11/29/2020 6:11:14 PM By: Deon Pilling Entered By: Deon Pilling on 11/29/2020 11:17:35 -------------------------------------------------------------------------------- Vitals Details Patient Name: Date of Service: [name], Aram J. 11/29/2020 10:30 A M Medical Record Number: [id number] Patient Account Number: [id number] Date of Birth/Sex: Treating RN: [birth year] (70 y.o. [name] Primary Care Elysha Daw: [name] Other Clinician: Referring  Ermalinda Joubert: Treating Alaysha Jefcoat/Extender: [name] in Treatment: 253 Vital Signs Time Taken: 11:08 Temperature (F): 98.0 Height (in): 70 Pulse (bpm): 63 Weight (lbs): 380.2 Respiratory Rate (breaths/min): 20 Body Mass Index (BMI): 54.5 Blood Pressure (mmHg): 169/63 Capillary Blood Glucose (mg/dl): 190 Reference Range: 80 - 120 mg / dl Electronic Signature(s) Signed: 11/30/2020 3:17:01 PM By: Sandre Kitty Entered By: Sandre Kitty on 11/29/2020 11:08:41

## 2020-12-06 ENCOUNTER — Other Ambulatory Visit: Payer: Self-pay

## 2020-12-06 ENCOUNTER — Ambulatory Visit (INDEPENDENT_AMBULATORY_CARE_PROVIDER_SITE_OTHER): Payer: Medicare Other

## 2020-12-06 ENCOUNTER — Encounter (HOSPITAL_BASED_OUTPATIENT_CLINIC_OR_DEPARTMENT_OTHER): Payer: Medicare Other | Attending: Physician Assistant | Admitting: Physician Assistant

## 2020-12-06 DIAGNOSIS — L03116 Cellulitis of left lower limb: Secondary | ICD-10-CM | POA: Diagnosis not present

## 2020-12-06 DIAGNOSIS — I890 Lymphedema, not elsewhere classified: Secondary | ICD-10-CM

## 2020-12-06 DIAGNOSIS — L97221 Non-pressure chronic ulcer of left calf limited to breakdown of skin: Secondary | ICD-10-CM | POA: Insufficient documentation

## 2020-12-06 DIAGNOSIS — E1140 Type 2 diabetes mellitus with diabetic neuropathy, unspecified: Secondary | ICD-10-CM | POA: Insufficient documentation

## 2020-12-06 DIAGNOSIS — L97811 Non-pressure chronic ulcer of other part of right lower leg limited to breakdown of skin: Secondary | ICD-10-CM | POA: Diagnosis not present

## 2020-12-06 DIAGNOSIS — E11622 Type 2 diabetes mellitus with other skin ulcer: Secondary | ICD-10-CM | POA: Insufficient documentation

## 2020-12-06 DIAGNOSIS — L03119 Cellulitis of unspecified part of limb: Secondary | ICD-10-CM

## 2020-12-06 DIAGNOSIS — I872 Venous insufficiency (chronic) (peripheral): Secondary | ICD-10-CM

## 2020-12-06 DIAGNOSIS — I87333 Chronic venous hypertension (idiopathic) with ulcer and inflammation of bilateral lower extremity: Secondary | ICD-10-CM | POA: Diagnosis not present

## 2020-12-06 DIAGNOSIS — L97211 Non-pressure chronic ulcer of right calf limited to breakdown of skin: Secondary | ICD-10-CM | POA: Insufficient documentation

## 2020-12-06 NOTE — Progress Notes (Addendum)
[name], ARMATO ([id number]) Visit Report for 12/06/2020 Chief Complaint Document Details Patient Name: Date of Service: CO MAREK, NGHIEM 12/06/2020 10:30 A M Medical Record Number: [id number] Patient Account Number: [id number] Date of Birth/Sex: Treating RN: Jul 16, 1951 (70 y.o. [name] Primary Care Provider: Nicoletta Ba Other Clinician: Referring Provider: Treating Provider/Extender: Adele Dan in Treatment: [id number] Information Obtained from: Patient Chief Complaint patient is here for evaluation venous/lymphedema weeping Electronic Signature(s) Signed: 12/06/2020 10:36:57 AM By: Lenda Kelp PA-C Entered By: Lenda Kelp on 12/06/2020 10:36:56 -------------------------------------------------------------------------------- HPI Details Patient Name: Date of Service: [name], Filippo J. 12/06/2020 10:30 A M Medical Record Number: [id number] Patient Account Number: [id number] Date of Birth/Sex: Treating RN: 1951/02/07 (70 y.o. [name] Primary Care Provider: Nicoletta Ba Other Clinician: Referring Provider: Treating Provider/Extender: Adele Dan in Treatment: 254 History of Present Illness HPI Description: Referred by PCP for consultation. Patient has long standing history of BLE venous stasis, no prior ulcerations. At beginning of month, developed cellulitis and weeping. Received IM Rocephin followed by Keflex and resolved. Wears compression stocking, appr 6 months old. Not sure strength. No present drainage. 01/22/16 this is a patient who is a type II diabetic on insulin. 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 He tells me he religiously wears pressure stockings of uncertain strength. He was here with weeping edema about 8 months ago but did not have an open wound. Roughly a month ago he had a reopening on his bilateral legs. He is been using bandages and Neosporin. He does  not complain of pain. 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 Last BUN/creatinine I have is from 11/20/15 at 13 and 1.0 respectively 01/29/16; patient arrives today having tolerated the Profore wrap. He brought in his stockings and these are 18 mmHg stockings he bought from [place]. The compression here is likely inadequate. He does not complain of pain or excessive drainage she has no systemic symptoms. The wound on the right looks improved as does the one on the left although one on the left is more substantial with still tissue at risk below the actual wound area on the bilateral posterior calf 02/05/16; patient arrives with poor edema control. He states that we did put a 4 layer compression on it last week. No weight appear 5 this. 02/12/16; the area on the posterior right Has healed. The left Has a substantial wound that has necrotic surface eschar that requires a debridement with a curette. 02/16/16;the patient called or a Nurse visit secondary to increased swelling. He had been in earlier in the week with his right leg healed. He was transitioned to is on pressure stocking on the right leg with the only open wound on the left, a substantial area on the left posterior calf. Note he has a history of severe lower extremity edema, he has a history of chronic atrial fibrillation but not heart failure per my notes but I'll need to research this. He is not complaining of chest pain shortness of breath or orthopnea. The intake nurse noted blisters on the previously closed right leg 02/19/16; this is the patient's regular visit day. I see him on Friday with escalating edema new wounds on the right leg and clear signs of at least right ventricular heart failure. I increased his Lasix to 40 twice a day.  He is returning currently in follow-up. States he is noticed a decrease in that the edema 02/26/16  patient's legs have much less edema. There is nothing really open on the right leg. The left leg has improved condition of the large superficial wound on the posterior left leg 03/04/16; edema control is very much better. The patient's right leg wounds have healed. On the left leg he continues to have severe venous inflammation on the posterior aspect of the left leg. There is no tenderness and I don't think any of this is cellulitis. 03/11/16; patient's right leg is married healed and he is in his own stocking. The patient's left leg has deteriorated somewhat. There is a lot of erythema around the wound on the posterior left leg. There is also a significant rim of erythema posteriorly just above where the wrap would've ended there is a new wound in this location and a lot of tenderness. Can't rule out cellulitis in this area. 03/15/16; patient's right leg remains healed and he is in his own stocking. The patient's left leg is much better than last review. His major wound on the posterior aspect of his left Is almost fully epithelialized. He has 3 small injuries from the wraps. Really. Erythema seems a lot better on antibiotics 03/18/16; right leg remains healed and he is in his own stocking. The patient's left leg is much better. The area on the posterior aspect of the left calf is fully epithelialized. His 3 small injuries which were wrap injuries on the left are improved only one seems still open his erythema has resolved 03/25/16; patient's right leg remains healed and he is in his own stocking. There is no open area today on the left leg posterior leg is completely closed up. His wrap injuries at the superior aspect of his leg are also resolved. He looks as though he has some irritation on the dorsal ankle but this is fully epithelialized without evidence of infection. 03/28/16; we discharged this patient on Monday. Transitioned him into his own stocking. There were problems almost immediately with  uncontrolled swelling weeping edema multiple some of which have opened. He does not feel systemically unwell in particular no chest pain no shortness of breath and he does not feel 04/08/16; the edema is under better control with the Profore light wrap but he still has pitting edema. There is one large wound anteriorly 2 on the medial aspect of his left leg and 3 small areas on the superior posterior calf. Drainage is not excessive he is tolerating a Profore light well 04/15/16; put a Profore wrap on him last week. This is controlled is edema however he had a lot of pain on his left anterior foot most of his wounds are healed 04/22/16 once again the patient has denuded areas on the left anterior foot which he states are because his wrap slips up word. He saw his primary physician today is on Lasix 40 twice a day and states that he his weight is down 20 pounds over the last 3 months. 04/29/16: Much improved. left anterior foot much improved. He is now on Lasix 80 mg per day. Much improved edema control 05/06/16; I was hoping to be able to discharge him today however once again he has blisters at a low level of where the compression was placed last week mostly on his left lateral but also his left medial leg and a small area on the anterior part of the left foot. 05/09/16; apparently the patient went  home after his appointment on 7/4 later in the evening developing pain in his upper medial thigh together with subjective fever and chills although his temperature was not taken. The pain was so intense he felt he would probably have to call 911. However he then remembered that he had leftover doxycycline from a previous round of antibiotics and took these. By the next morning he felt a lot better. He called and spoke to one of our nurses and I approved doxycycline over the phone thinking that this was in relation to the wounds we had previously seen although they were definitely were not. The patient feels a lot  better old fever no chills he is still working. Blood sugars are reasonably controlled 05/13/16; patient is back in for review of his cellulitis on his anterior medial upper thigh. He is taking doxycycline this is a lot better. Culture I did of the nodular area on the dorsal aspect of his foot grew MRSA this also looks a lot better. 05/20/16; the patient is cellulitis on the medial upper thigh has resolved. All of his wound areas including the left anterior foot, areas on the medial aspect of the left calf and the lateral aspect of the calf at all resolved. He has a new blister on the left dorsal foot at the level of the fourth toe this was excised. No evidence of infection 05/27/16; patient continues to complain weeping edema. He has new blisterlike wounds on the left anterior lateral and posterior lateral calf at the top of his wrap levels. The area on his left anterior foot appears better. He is not complaining of fever, pain or pruritus in his feet. 05/30/16; the patient's blisters on his left anterior leg posterior calf all look improved. He did not increase the Lasix 100 mg as I suggested because he was going to run out of his 40 mg tablets. He is still having weeping edema of his toes 06/03/16; I renewed his Lasix at 80 mg once a day as he was about to run out when I last saw him. He is on 80 mg of Lasix now. I have asked him to cut down on the excessive amount of water he was drinking and asked him to drink according to his thirst mechanisms 06/12/2016 -- was seen 2 days ago and was supposed to wear his compression stockings at home but he is developed lymphedema and superficial blisters on the left lower extremity and hence came in for a review 06/24/16; the remaining wound is on his left anterior leg. He still has edema coming from between his toes. There is lymphedema here however his edema is generally better than when I last saw this. He has a history of atrial fibrillation but does not have a  known history of congestive heart failure nevertheless I think he probably has this at least on a diastolic basis. 07/01/16 I reviewed his echocardiogram from January 2017. This was essentially normal. He did not have LVH, EF of 55-60%. His right ventricular function was normal although he did have trivial tricuspid and pulmonic regurgitation. This is not audible on exam however. I increased his Lasix to do massive edema in his legs well above his knees I think in early July. He was also drinking an excessive amount of water at the time. 07/15/16; missed his appointment last week because of the Labor Day holiday on Monday. He could not get another appointment later in the week. Started to feel the wrap digging in superiorly so  we remove the top half and the bottom half of his wrap. He has extensive erythema and blistering superiorly in the left leg. Very tender. Very swollen. Edema in his foot with leaking edema fluid. He has not been systemically unwell 07/22/16; the area on the left leg laterally required some debridement. The medial wounds look more stable. His wrap injury wounds appear to have healed. Edema and his foot is better, weeping edema is also better. He tells me he is meeting with the supplier of the external compression pumps at work 08/05/16; the patient was on vacation last week in [facility]. His wrap is been on for an extended period of time. Also over the weekend he developed an extensive area of tender erythema across his anterior medial thigh. He took to doxycycline yesterday that he had leftover from a previous prescription. The patient complains of weeping edema coming out of his toes 08/08/16; I saw this patient on 10/2. He was tender across his anterior thigh. I put him on doxycycline. He returns today in follow-up. He does not have any open wounds on his lower leg, he still has edema weeping into his toes. 08/12/16; patient was seen back urgently today to follow-up for his  extensive left thigh cellulitis/erysipelas. He comes back with a lot less swelling and erythema pain is much better. I believe I gave him Augmentin and Cipro. His wrap was cut down as he stated a roll down his legs. He developed blistering above the level of the wrap that remained. He has 2 open blisters and 1 intact. 08/19/16; patient is been doing his primary doctor who is increased his Lasix from 40-80 once a day or 80 already has less edema. Cellulitis has remained improved in the left thigh. 2 open areas on the posterior left calf 08/26/16; he returns today having new open blisters on the anterior part of his left leg. He has his compression pumps but is not yet been shown how to use some vital representative from the supplier. 09/02/16 patient returns today with no open wounds on the left leg. Some maceration in his plantar toes 09/10/2016 -- Dr. Leanord Hawking had recently discharged him on 09/02/2016 and he has come right back with redness swelling and some open ulcers on his left lower extremity. He says this was caused by trying to apply his compression stockings and he's been unable to use this and has not been able to use his lymphedema pumps. He had some doxycycline leftover and he has started on this a few days ago. 09/16/16; there are no open wounds on his leg on the left and no evidence of cellulitis. 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 He does not complain of symptoms here. I am not clear use using his external compression pumps. 09/23/16; I have not seen this patient in 2 weeks. He canceled his appointment 10 days ago as he was going on vacation. He tells me that on Monday he noticed a large area on his posterior left leg which is been draining copiously and is reopened into a large wound. He is been using ABDs and the external part of his juxtalite, according to our nurse this was not on properly. 10/07/16; Still a substantial  area on the posterior left leg. Using silver alginate 10/14/16; in general better although there is still open area which looks healthy. Still using silver alginate. He reminds me that this happen before he left for [facility].  T oday while he was showering in the morning. He had been using his juxtalite's 10/21/16; the area on his posterior left leg is fully epithelialized. However he arrives today with a large area of tender erythema in his medial and posterior left thigh just above the knee. I have marked the area. Once again he is reluctant to consider hospitalization. I treated him with oral antibiotics in the past for a similar situation with resolution I think with doxycycline however this area it seems more extensive to me. He is not complaining of fever but does have chills and says states he is thirsty. His blood sugar today was in the 140s at home 10/25/16 the area on his posterior left leg is fully epithelialized although there is still some weeping edema. The large area of tenderness and erythema in his medial and posterior left thigh is a lot less tender although there is still a lot of swelling in this thigh. He states he feels a lot better. He is on doxycycline and Augmentin that I started last week. This will continued until Tuesday, December 26. I have ordered a duplex ultrasound of the left thigh rule out DVT whether there is an abscess something that would need to be drained I would also like to know. 11/01/16; he still has weeping edema from a not fully epithelialized area on his left posterior calf. Most of the rest of this looks a lot better. He has completed his antibiotics. His thigh is a lot better. Duplex ultrasound did not show a DVT in the thigh 11/08/16; he comes in today with more Denuded surface epithelium from the posterior aspect of his calf. There is no real evidence of cellulitis. The superior aspect of his wrap appears to have put quite an indentation in his leg  just below the knee and this may have contributed. He does not complain of pain or fever. We have been using silver alginate as the primary dressing. The area of cellulitis in the right thigh has totally resolved. He has been using his compression stockings once a week 11/15/16; the patient arrives today with more loss of epithelium from the posterior aspect of his left calf. He now has a fairly substantial wound in this area. The reason behind this deterioration isn't exactly clear although his edema is not well controlled. He states he feels he is generally more swollen systemically. He is not complaining of chest pain shortness of breath fever. T me he has an appointment with his primary physician in early February. He is on 80 mg of oral ells Lasix a day. He claims compliance with the external compression pumps. He is not having any pain in his legs similar to what he has with his recurrent cellulitis 11/22/16; the patient arrives a follow-up of his large area on his left lateral calf. This looks somewhat better today. He came in earlier in the week for a dressing change since I saw him a week ago. He is not complaining of any pain no shortness of breath no chest pain 11/28/16; the patient arrives for follow-up of his large area on the left lateral calf this does not look better. In fact it is larger weeping edema. The surface of the wound does not look too bad. We have been using silver alginate although I'm not certain that this is a dressing issue. 12/05/16; again the patient follows up for a large wound on the left lateral and left posterior calf this does not look better. There continues  to be weeping edema necrotic surface tissue. More worrisome than this once again there is erythema below the wound involving the distal Achilles and heel suggestive of cellulitis. He is on his feet working most of the day of this is not going well. We are changing his dressing twice a week to facilitate the  drainage. 12/12/16; not much change in the overall dimensions of the large area on the left posterior calf. This is very inflamed looking. I gave him an. Doxycycline last week does not really seem to have helped. He found the wrap very painful indeed it seems to of dog into his legs superiorly and perhaps around the heel. He came in early today because the drainage had soaked through his dressings. 12/19/16- patient arrives for follow-up evaluation of his left lower extremity ulcers. He states that he is using his lymphedema pumps once daily when there is "no drainage". He admits to not using his lipedema pumps while under current treatment. His blood sugars have been consistently between 150-200. 12/26/16; the patient is not using his compression pumps at home because of the wetness on his feet. I've advised him that I think it's important for him to use this daily. He finds his feet too wet, he can put a plastic bag over his legs while he is in the pumps. Otherwise I think will be in a vicious circle. We are using silver alginate to the major area on his left posterior calf 01/02/17; the patient's posterior left leg has further of all into 3 open wounds. All of them covered with a necrotic surface. He claims to be using his compression pumps once a day. His edema control is marginal. Continue with silver alginate 01/10/17; the patient's left posterior leg actually looks somewhat better. There is less edema, less erythema. Still has 3 open areas covered with a necrotic surface requiring debridement. He claims to be using his compression pumps once a day his edema control is better 01/17/17; the patient's left posterior calf look better last week when I saw him and his wrap was changed 2 days ago. He has noted increasing pain in the left heel and arrives today with much larger wounds extensive erythema extending down into the entire heel area especially tender medially. He is not systemically unwell CBGs have  been controlled no fever. Our intake nurse showed me limegreen drainage on his AVD pads. 01/24/17; his usual this patient responds nicely to antibiotics last week giving him Levaquin for presumed Pseudomonas. The whole entire posterior part of his leg is much better much less inflamed and in the case of his Achilles heel area much less tender. He has also had some epithelialization posteriorly there are still open areas here and still draining but overall considerably better 01/31/17- He has continue to tolerate the compression wraps. he states that he continues to use the lymphedema pumps daily, and can increase to twice daily on the weekends. He is voicing no complaints or concerns regarding his LLE ulcers 02/07/17-he is here for follow-up evaluation. He states that he noted some erythema to the left medial and anterior thigh, which he states is new as of yesterday. He is concerned about recurrent cellulitis. He states his blood sugars have been slightly elevated, this morning in the 180s 02/14/17; he is here for follow-up evaluation. When he was last here there was erythema superiorly from his posterior wound in his anterior thigh. He was prescribed Levaquin however a culture of the wound surface grew MRSA over the  phone I changed him to doxycycline on Monday and things seem to be a lot better. 02/24/17; patient missed his appointment on Friday therefore we changed his nurse visit into a physician visit today. Still using silver alginate on the large area of the posterior left thigh. He isn't new area on the dorsal left second toe 03/03/17; actually better today although he admits he has not used his external compression pumps in the last 2 days or so because of work responsibilities over the weekend. 03/10/17; continued improvement. External compression pumps once a day almost all of his wounds have closed on the posterior left calf. Better edema control 03/17/17; in general improved. He still has 3 small  open areas on the lateral aspect of his left leg however most of the area on the posterior part of his leg is epithelialized. He has better edema control. He has an ABD pad under his stocking on the right anterior lower leg although he did not let us look at that today. 03/24/17; patient arrives back in clinic today with no open areas however there are areas on the posterior left calf and anterior left calf that are less than 100% epithelialized. His edema is well controlled in the left lower leg. There is some pitting edema probably lymphedema in the left upper thigh. He uses compression pumps at home once per day. I tried to get him to do this twice a day although he is very reticent. 04/01/2017 -- for the last 2 days he's had significant redness, tenderness and weeping and came in for an urgent visit today. 04/07/17; patient still has 6 more days of doxycycline. He was seen by Dr. Meyer Russel last Wednesday for cellulitis involving the posterior aspect, lateral aspect of his Involving his heel. For the most part he is better there is less erythema and less weeping. He has been on his feet for 12 hours 2 over the weekend. Using his compression pumps once a day 04/14/17 arrives today with continued improvement. Only one area on the posterior left calf that is not fully epithelialized. He has intense bilateral venous inflammation associated with his chronic venous insufficiency disease and secondary lymphedema. We have been using silver alginate to the left posterior calf wound In passing he tells [place] today that the right leg but we have not seen in quite some time has an open area on it but he doesn't want [place] to look at this today states he will show this to [place] next week. 04/21/17; there is no open area on his left leg although he still reports some weeping edema. He showed [place] his right leg today which is the first time we've seen this leg in a long time. He has a large area of open wound on the right leg  anteriorly healthy granulation. Quite a bit of swelling in the right leg and some degree of venous inflammation. He told us about the right leg in passing last week but states that deterioration in the right leg really only happened over the weekend 04/28/17; there is no open area on the left leg although there is an irritated part on the posterior which is like a wrap injury. The wound on the right leg which was new from last week at least to [place] is a lot better. 05/05/17; still no open area on the left leg. Patient is using his new compression stocking which seems to be doing a good job of controlling the edema. He states he is using his compression  pumps once per day. The right leg still has an open wound although it is better in terms of surface area. Required debridement. A lot of pain in the posterior right Achilles marked tenderness. Usually this type of presentation this patient gives concern for an active cellulitis 05/12/17; patient arrives today with his major wound from last week on the right lateral leg somewhat better. Still requiring debridement. He was using his compression stocking on the left leg however that is reopened with superficial wounds anteriorly he did not have an open wound on this leg previously. He is still using his juxta light's once daily at night. He cannot find the time to do this in the morning as he has to be at work by 7 AM 05/19/17; right lateral leg wound looks improved. No debridement required. The concerning area is on the left posterior leg which appears to almost have a subcutaneous hemorrhagic component to it. We've been using silver alginate to all the wounds 05/26/17; the right lateral leg wound continues to look improved. However the area on the left posterior calf is a tightly adherent surface. [place] using silver alginate. Because of the weeping edema in his legs there is very little good alternatives. 06/02/17; the patient left here last week looking quite  good. Major wound on the left posterior calf and a small one on the right lateral calf. Both of these look satisfactory. He tells me that by Wednesday he had noted increased pain in the left leg and drainage. He called on Thursday and Friday to get an appointment here but we were blocked. He did not go to urgent care or his primary physician. He thinks he had a fever on Thursday but did not actually take his temperature. He has not been using his compression pumps on the left leg because of pain. I advised him to go to the emergency room today for IV antibiotics for stents of left leg cellulitis but he has refused I have asked him to take 2 days off work to keep his leg elevated and he has refused this as well. In view of this I'm going to call him and Augmentin and doxycycline. 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 However after a detailed discussion regarding the risk benefits and alternatives he refuses to get admitted to the hospital. With no other recourse I will continue him on oral antibiotics as before and hopefully he'll have his infectious disease consultation this week. 06/16/2017 -- the patient was seen today by the nurse practitioner at infectious disease Ms. Dixon. 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 He has also been advise Lotrimin cream locally. she also had wise IV antibiotics for his condition if it flares up 06/23/17; patient arrives today with drainage bilaterally although the remaining wound on the left posterior calf after cleaning up today "highlighter  yellow drainage" did not look too bad. Unfortunately he has had breakdown on the right anterior leg [previously this leg had not been open and he is using a black stocking] he went to see infectious disease  and is been put on clindamycin 150 mg daily, I did not verify the dose although I'm not familiar with using clindamycin in this dosing range, perhaps for prophylaxisoo 06/27/17; I brought this patient back today to follow-up on the wound deterioration on the right lower leg together with surrounding cellulitis. I started him on doxycycline 4 days ago. This area looks better however he comes in today with intense cellulitis on the medial part of his left thigh. This is not have a wound in this area. Extremely tender. We've been using silver alginate to the wounds on the right lower leg left lower leg with bilateral 4 layer compression he is using his external compression pumps once a day 07/04/17; patient's left medial thigh cellulitis looks better. He has not been using his compression pumps as his insert said it was contraindicated with cellulitis. His right leg continues to make improvements all the wounds are still open. We only have one remaining wound on the left posterior calf. Using silver alginate to all open areas. He is on doxycycline which I started a week ago and should be finishing I gave him Augmentin after Thursday's visit for the severe cellulitis on the left medial thigh which fortunately looks better 07/14/17; the patient's left medial thigh cellulitis has resolved. The cellulitis in his right lower calf on the right also looks better. All of his wounds are stable to improved we've been using silver alginate he has completed the antibiotics I have given him. He has clindamycin 150 mg once a day prescribed by infectious disease for prophylaxis, I've advised him to start this now. We have been using bilateral Unna boots over silver alginate to the wound areas 07/21/17; the patient is  been to see infectious disease who noted his recurrent problems with cellulitis. He was not able to tolerate prophylactic clindamycin therefore he is on amoxicillin 500 twice a day. He also had a second daily dose of Lasix added By Dr. Oneta Rack but he is not taking this. Nor is he being completely compliant with his compression pumps a especially not this week. He has 2 remaining wounds one on the right posterior lateral lower leg and one on the left posterior medial lower leg. 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 The patient has Unna boots bilaterally. 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 He has bilateral Unna boots for compression and silver alginate to his wounds. Arrives today with his legs looking as good as I have seen him in quite some time. Not surprisingly his wounds look better as well with improvement on the right lateral leg venous insufficiency wound and also the left medial leg. He is still using the compression pumps once a day 08/11/17; both legs appear to be doing better wounds on the right lateral and left medial legs look better. Skin on the right leg quite good. He is been using silver alginate as the primary dressing. I'm going to use Anasept gel calcium alginate and maintain all the secondary dressings 08/18/17; the patient continues to actually do quite well. The area on his right lateral leg is just about closed the left medial also looks better although it is still moist in this area. His edema is well controlled we have been using Anasept gel with calcium alginate and  the usual secondary dressings, 4 layer compression and once daily use of his compression pumps "always been able to manage 09/01/17; the patient continues to do reasonably well in  spite of his trip to [place]. The area on the right lateral leg is epithelialized. Left is much better but still open. He has more edema and more chronic erythema on the left leg [venous inflammation] 09/08/17; he arrives today with no open wound on the right lateral leg and decently controlled edema. Unfortunately his left leg is not nearly as in his good situation as last week.he apparently had increasing edema starting on Saturday. He edema soaked through into his foot so used a plastic bag to walk around his home. The area on the medial right leg which was his open area is about the same however he has lost surface epithelium on the left lateral which is new and he has significant pain in the Achilles area of the left foot. He is already on amoxicillin chronically for prophylaxis of cellulitis in the left leg 09/15/17; he is completed a week of doxycycline and the cellulitis in the left posterior leg and Achilles area is as usual improved. He still has a lot of edema and fluid soaking through his dressings. There is no open wound on the right leg. He saw infectious disease NP today 09/22/17;As usual 1 we transition him from our compression wraps to his stockings things did not go well. He has several small open areas on the right leg. He states this was caused by the compression wrap on his skin although he did not wear this with the stockings over them. He has several superficial areas on the left leg medially laterally posteriorly. He does not have any evidence of active cellulitis especially involving the left Achilles The patient is traveling from [facility] Saturday going to [facility]. He states he isn't attempting to get an appointment with a heel objects wound center there to change his dressings. I am not completely certain whether this will work 10/06/17; the patient came in on Friday for a nurse visit and the nurse reported that his legs actually look quite good. He arrives in clinic  today for his regular follow-up visit. He has a new wound on his left third toe over the PIP probably caused by friction with his footwear. He has small areas on the left leg and a very superficial but epithelialized area on the right anterior lateral lower leg. Other than that his legs look as good as I've seen him in quite some time. We have been using silver alginate Review of systems; no chest pain no shortness of breath other than this a 10 point review of systems negative 10/20/17; seen by Dr. Meyer Russel last week. He had taken some antibiotics [doxycycline] that he had left over. Dr. Meyer Russel thought he had candida infection and declined to give him further antibiotics. He has a small wound remaining on the right lateral leg several areas on the left leg including a larger area on the left posterior several left medial and anterior and a small wound on the left lateral. The area on the left dorsal third toe looks a lot better. ROS; Gen.; no fever, respiratory no cough no sputum Cardiac no chest pain other than this 10 point review of system is negative 10/30/17; patient arrives today having fallen in the bathtub 3 days ago. It took him a while to get up. He has pain and maceration in the wounds on his left  leg which have deteriorated. He has not been using his pumps he also has some maceration on the right lateral leg. 11/03/17; patient continues to have weeping edema especially in the left leg. This saturates his dressings which were just put on on 12/27. As usual the doxycycline seems to take care of the cellulitis on his lower leg. He is not complaining of fever, chills, or other systemic symptoms. He states his leg feels a lot better on the doxycycline I gave him empirically. He also apparently gets injections at his primary doctor's officeo Rocephin for cellulitis prophylaxis. I didn't ask him about his compression pump compliance today I think that's probably marginal. Arrives in the clinic  with all of his dressings primary and secondary macerated full of fluid and he has bilateral edema 11/10/17; the patient's right leg looks some better although there is still a cluster of wounds on the right lateral. The left leg is inflamed with almost circumferential skin loss medially to laterally although we are still maintaining anteriorly. He does not have overt cellulitis there is a lot of drainage. He is not using compression pumps. We have been using silver alginate to the wound areas, there are not a lot of options here 11/17/17; the patient's right leg continues to be stable although there is still open wounds, better than last week. The inflammation in the left leg is better. Still loss of surface layer epithelium especially posteriorly. There is no overt cellulitis in the amount of edema and his left leg is really quite good, tells me he is using his compression pumps once a day. 11/24/17; patient's right leg has a small superficial wound laterally this continues to improve. The inflammation in the left leg is still improving however we have continuous surface layer epithelial loss posteriorly. There is no overt cellulitis in the amount of edema in both legs is really quite good. He states he is using his compression pumps on the left leg once a day for 5 out of 7 days 12/01/17; very small superficial areas on the right lateral leg continue to improve. Edema control in both legs is better today. He has continued loss of surface epithelialization and left posterior calf although I think this is better. We have been using silver alginate with large number of absorptive secondary dressings 4 layer on the left Unna boot on the right at his request. He tells me he is using his compression pumps once a day 12/08/17; he has no open area on the right leg is edema control is good here. On the left leg however he has marked erythema and tenderness breakdown of skin. He has what appears to be a wrap injury  just distal to the popliteal fossa. 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 There was swelling erythema and breakdown in the right leg. He has much worse skin breakdown in the left leg as well multiple open areas medially and posteriorly as well as laterally. He tells me he has been using his compression pumps but tells me he feels that the drainage out of his leg is worse when he uses a compression pumps. T be fair to him he is been saying this o for a while however I don't  know that I have really been listening to this. I wonder if the compression pumps are working properly 12/22/17;. Once again he arrives with severe erythema, weeping edema from the left greater than right leg. Noncompliance with compression pumps. New this visit he is complaining of pain on the lateral aspect of the right leg and the medial aspect of his right thigh. He apparently saw his cardiologist Dr. [name] who was ordered an echocardiogram area and I think this is a step in the right direction 12/25/17; started his doxycycline Monday night. There is still intense erythema of the right leg especially in the anterior thigh although there is less tenderness. The erythema around the wound on the right lateral calf also is less tender. He still complaining of pain in the left heel. His wounds are about the same right lateral left medial left lateral. Superficial but certainly not close to closure. He denies being systemically unwell no fever chills no abdominal pain no diarrhea 12/29/17; back in follow-up of his extensive right calf and right thigh cellulitis. I added amoxicillin to cover possible doxycycline resistant strep. This seems to of done the  trick he is in much less pain there is much less erythema and swelling. He has his echocardiogram at 11:00 this morning. X-ray of the left heel was also negative. 01/05/18; the patient arrived with his edema under much better control. Now that he is retired he is able to use his compression pumps daily and sometimes twice a day per the patient. He has a wound on the right leg the lateral wound looks better. Area on the left leg also looks a lot better. He has no evidence of cellulitis in his bilateral thighs I had a quick peak at his echocardiogram. He is in normal ejection fraction and normal left ventricular function. He has moderate pulmonary hypertension moderately reduced right ventricular function. One would have to wonder about chronic sleep apnea although he says he doesn't snore. He'll review the echocardiogram with his cardiologist. 01/12/18; the patient arrives with the edema in both legs under exemplary control. He is using his compression pumps daily and sometimes twice daily. His wound on the right lateral leg is just about closed. He still has some weeping areas on the posterior left calf and lateral left calf although everything is just about closed here as well. I have spoken with Aldean Baker who is the patient's nurse practitioner and infectious disease. She was concerned that the patient had not understood that the parenteral penicillin injections he was receiving for cellulitis prophylaxis was actually benefiting him. I don't think the patient actually saw that I would tend to agree we were certainly dealing with less infections although he had a serious one last month. 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 He is healed. He'll be placed in juxtalite compression wraps and increase his lymphedema pumps to twice daily. We will follow up again next week to ensure there are no issues with the new regiment. 01/20/18-he is here for evaluation of  bilateral lower extremity weeping edema. Yesterday he was placed in compression wrap to the right lower extremity and compression stocking to left lower shrubbery. He states he uses lymphedema pumps last night and again this morning and noted a blister to the left lower extremity. On exam he was noted to have drainage to the right lower extremity. He will be placed in Unna boots bilaterally and follow-up next week 01/26/18; patient was actually discharged a week  ago to his own juxta light stockings only to return the next day with bilateral lower extremity weeping edema.he was placed in bilateral Unna boots. He arrives today with pain in the back of his left leg. There is no open area on the right leg however there is a linear/wrap injury on the left leg and weeping edema on the left leg posteriorly. I spoke with infectious disease about 10 days ago. They were disappointed that the patient elected to discontinue prophylactic intramuscular penicillin shots as they felt it was particularly beneficial in reducing the frequency of his cellulitis. I discussed this with the patient today. He does not share this view. He'll definitely need antibiotics today. Finally he is traveling to [place] and trauma leaving this Saturday and returning a week later and he does not travel with his pumps. He is going by car 01/30/18; patient was seen 4 days ago and brought back in today for review of cellulitis in the left leg posteriorly. I put him on amoxicillin this really hasn't helped as much as I might like. He is also worried because he is traveling to [facility] by car. Finally we will be rewrapping him. There is no open area on the right leg over his left leg has multiple weeping areas as usual 02/09/18; The same wrap on for 10 days. He did not pick up the last doxycycline I prescribed for him. He apparently took 4 days worth he already had. There is nothing open on his right leg and the edema control is really  quite good. He's had damage in the left leg medially and laterally especially probably related to the prolonged use of Unna boots 02/12/18; the patient arrived in clinic today for a nurse visit/wrap change. He complained of a lot of pain in the left posterior calf. He is taking doxycycline that I previously prescribed for him. Unfortunately even though he used his stockings and apparently used to compression pumps twice a day he has weeping edema coming out of the lateral part of his right leg. This is coming from the lower anterior lateral skin area. 02/16/18; the patient has finished his doxycycline and will finish the amoxicillin 2 days. The area of cellulitis in the left calf posteriorly has resolved. He is no longer having any pain. He tells me he is using his compression pumps at least once a day sometimes twice. 02/23/18; the patient finished his doxycycline and Amoxil last week. On Friday he noticed a small erythematous circle about the size of a quarter on the left lower leg just above his ankle. This rapidly expanded and he now has erythema on the lateral and posterior part of the thigh. This is bright red. Also has an area on the dorsal foot just above his toes and a tender area just below the left popliteal fossa. He came off his prophylactic penicillin injections at his own insistence one or 2 months ago. This is obviously deteriorated since then 03/02/18; patient is on doxycycline and Amoxil. Culture I did last week of the weeping area on the back of his left calf grew group B strep. I have therefore renewed the amoxicillin 500 3 times a day for a further week. He has not been systemically unwell. Still complaining of an area of discomfort right under his left popliteal fossa. There is no open wound on the right leg. He tells me that he is using his pumps twice a day on most days 03/09/18; patient arrives in clinic today completing his  amoxicillin today. The cellulitis on his left leg is  better. Furthermore he tells me that he had intramuscular penicillin shots that his primary care office today. However he also states that the wrap on his right leg fell down shortly after leaving clinic last week. He developed a large blister that was present when he came in for a nurse visit later in the week and then he developed intense discomfort around this area.He tells me he is using his compression pumps 03/16/18; the patient has completed his doxycycline. The infectious part of this/cellulitis in the left heel area left popliteal area is a lot better. He has 2 open areas on the right calf. Still areas on the left calf but this is a lot better as well. 03/24/18; the patient arrives complaining of pain in the left popliteal area again. He thinks some of this is wrap injury. He has no open area on the right leg and really no open area on the left calf either except for the popliteal area. He claims to be compliant with the compression pumps 03/31/18; I gave him doxycycline last week because of cellulitis in the left popliteal area. This is a lot better although the surface epithelium is denuded off and response to this. He arrives today with uncontrolled edema in the right calf area as well as a fingernail injury in the right lateral calf. There is only a few open areas on the left 04/06/18; I gave him amoxicillin doxycycline over the last 2 weeks that the amoxicillin should be completing currently. He is not complaining of any pain or systemic symptoms. The only open areas see has is on the right lateral lower leg paradoxically I cannot see anything on the left lower leg. He tells me he is using his compression pumps twice a day on most days. Silver alginate to the wounds that are open under 4 layer compression 04/13/18; he completed antibiotics and has no new complaints. Using his compression pumps. Silver alginate that anything that's opened 04/20/18; he is using his compression pumps religiously.  Silver alginate 4 layer compression anything that's opened. He comes in today with no open wounds on the left leg but 3 on the right including a new one posteriorly. He has 2 on the right lateral and one on the right posterior. He likes Unna boots on the right leg for reasons that aren't really clear we had the usual 4 layer compression on the left. It may be necessary to move to the 4 layer compression on the right however for now I left them in the Unna boots 04/27/18; he is using his compression pumps at least once a day. He has still the wounds on the right lateral calf. The area right posteriorly has closed. He does not have an open wound on the left under 4 layer compression however on the dorsal left foot just proximal to the toes and the left third toe 2 small open areas were identified 05/11/18; he has not uses compression pumps. The areas on the right lateral calf have coalesced into one large wound necrotic surface. On the left side he has one small wound anteriorly however the edema is now weeping out of a large part of his left leg. He says he wasn't using his pumps because of the weeping fluid. I explained to him that this is the time he needs to pump more 05/18/18; patient states he is using his compression pumps twice a day. The area on the right lateral large  wound albeit superficial. On the left side he has innumerable number of small new wounds on the left calf particularly laterally but several anteriorly and medially. All these appear to have healthy granulated base these look like the remnants of blisters however they occurred under compression. The patient arrives in clinic today with his legs somewhat better. There is certainly less edema, less multiple open areas on the left calf and the right anterior leg looks somewhat better as well superficial and a little smaller. However he relates pain and erythema over the last 3-4 days in the thigh and I looked at this today. He has not  been systemically unwell no fever no chills no change in blood sugar values 05/25/18; comes in today in a better state. The severe cellulitis on his left leg seems better with the Keflex. Not as tender. He has not been systemically unwell Hard to find an open wound on the left lower leg using his compression pumps twice a day The confluent wounds on his right lateral calf somewhat better looking. These will ultimately need debridement I didn't do this today. 06/01/18; the severe cellulitis on the left anterior thigh has resolved and he is completed his Keflex. There is no open wound on the left leg however there is a superficial excoriation at the base of the third toe dorsally. Skin on the bottom of his left foot is macerated looking. The left the wounds on the lateral right leg actually looks some better although he did require debridement of the top half of this wound area with an open curet 06/09/18 on evaluation today patient appears to be doing poorly in regard to his right lower extremity in particular this appears to likely be infected he has very thick purulent discharge along with a bright green tent to the discharge. This makes me concerned about the possibility of pseudomonas. He's also having increased discomfort at this point on evaluation. Fortunately there does not appear to be any evidence of infection spreading to the other location at this time. 06/16/18 on evaluation today patient appears to actually be doing fairly well. His ulcer has actually diminished in size quite significantly at this point which is good news. Nonetheless he still does have some evidence of infection he did see infectious disease this morning before coming here for his appointment. I did review the results of their evaluation and their note today. They did actually have him discontinue the Cipro and initiate treatment with linezolid at this time. He is doing this for the next seven days and they recommended a  follow-up in four months with them. He is the keep a log of the need for intermittent antibiotic therapy between now and when he falls back up with infectious disease. This will help them gaze what exactly they need to do to try and help them out. 06/23/18; the patient arrives today with no open wounds on the left leg and left third toe healed. He is been using his compression pumps twice a day. On the right lateral leg he still has a sizable wound but this is a lot better than last time I saw this. 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 Has been using silver alginate under 4 layer compression 06/30/18; the only open wound he has is on the right lateral leg and this looks healthy. No debridement is required. We have been using silver alginate.  He does not have an open wound on the left leg. There is apparently some drainage from the dorsal proximal third toe on the left although I see no open wound here. 07/03/18 on evaluation today patient was actually here just for a nurse visit rapid change. However when he was here on Wednesday for his rat change due to having been healed on the left and then developing blisters we initiated the wrap again knowing that he would be back today for [place] to reevaluate and see were at. Unfortunately he has developed some cellulitis into the proximal portion of his right lower extremity even into the region of his thigh. 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 He was placed on one as what at that point. Nonetheless he is done with that and has been tolerating it well otherwise. Doxycycline which in the past really did not seem to be effective for him. Nonetheless I think the best option may be for [place] to definitely reinitiate the antibiotics for a longer period of time. 07/07/18; since I last saw this patient a week ago he has  had a difficult time. At that point he did not have an open wound on his left leg. We transitioned him into juxta light stockings. He was apparently in the clinic the next day with blisters on the left lateral and left medial lower calf. He also had weeping edema fluid. He was put back into a compression wrap. He was also in the clinic on Friday with intense erythema in his right thigh. Per the patient he was started on Bactrim however that didn't work at all in terms of relieving his pain and swelling. He has taken 3 doxycycline that he had left over from last time and that seems to of helped. He has blistering on the right thigh as well. 07/14/18; the erythema on his right thigh has gotten better with doxycycline that he is finishing. 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 Presumably this cellulitis in the thigh was not related to that although I think this is a bit concerning going forward. He still has an area on the right lateral calf the blister on the right medial calf just below the knee that was discussed above. On the left 2 small open areas left medial and left lateral. Edema control is adequate. He is using his compression pumps twice a day 07/20/18; continued improvement in the condition of both legs especially the edema in his bilateral thighs. He tells me he is been losing weight through a combination of diet and exercise. He is using his compression pumps twice a day. So overall she made to the remaining wounds 07/27/2018; continued improvement in condition of both legs. His edema is well controlled. The area on the right lateral leg is just about closed he had one blisters show up on the medial left upper calf. We have him in 4 layer compression. He is going on a 10-day trip to [place], [place] and [place]. He will be driving. He wants to wear Unna boots because of the lessening amount of constriction.  He will not use compression pumps while he is away 08/05/18 on evaluation today patient actually appears to be doing decently well all things considered in regard to his bilateral lower extremities. The worst ulcer is actually only posterior aspect of his left lower extremity  with a four layer compression wrap cut into his leg a couple weeks back. He did have a trip and actually had [employer] for the trip that he is worn since he was last here. Nonetheless he feels like the [employer] actually do better for him his swelling is up a little bit but he also with his trip was not taking his Lasix on a regular set schedule like he was supposed to be. He states that obviously the reason being that he cannot drive and keep going without having to urinate too frequently which makes it difficult. He did not have his pumps with him while he was away either which I think also maybe playing a role here too. 08/13/2018; the patient only has a small open wound on the right lateral calf which is a big improvement in the last month or 2. He also has the area posteriorly just below the posterior fossa on the left which I think was a wrap injury from several weeks ago. He has no current evidence of cellulitis. He tells me he is back into his compression pumps twice a day. He also tells me that while he was at the laundromat somebody stole a section of his extremitease stockings 08/20/2018; back in the clinic with a much improved state. He only has small areas on the right lateral mid calf which is just about healed. This was is more substantial area for quite a prolonged period of time. He has a small open area on the left anterior tibia. The area on the posterior calf just below the popliteal fossa is closed today. He is using his compression pumps twice a day 08/28/2018; patient has no open wound on the right leg. He has a smattering of open areas on the calf with some weeping lymphedema. More  problematically than that it looks as though his wraps of slipped down in his usual he has very angry upper area of edema just below the right medial knee and on the right lateral calf. He has no open area on his feet. The patient is traveling to [facility] next week. I will send him in an antibiotic. We will continue to wrap the right leg. We ordered extremitease stockings for him last week and I plan to transition the right leg to a stocking when he gets home which will be in 10 days time. As usual he is very reluctant to take his pumps with him when he travels 09/07/2018; patient returns from [facility]. He shows me a picture of his left leg in the mid part of his trip last week with intense fire engine erythema. The picture look bad enough I would have considered sending him to the hospital. Instead he went to the wound care center in [facility]. They did not prescribe him antibiotics but he did take some doxycycline he had leftover from a previous visit. I had given him trimethoprim sulfamethoxazole before he left this did not work according to the patient. This is resulted in some improvement fortunately. He comes back with a large wound on the left posterior calf. Smaller area on the left anterior tibia. Denuded blisters on the dorsal left foot over his toes. Does not have much in the way of wounds on the right leg although he does have a very tender area on the right posterior area just below the popliteal fossa also suggestive of infection. He promises me he is back on his pumps twice a day  09/15/2018; the intense cellulitis in his left lower calf is a lot better. The wound area on the posterior left calf is also so better. However he has reasonably extensive wounds on the dorsal aspect of his second and third toes and the proximal foot just at the base of the toes. There is nothing open on the right leg 09/22/2018; the patient has excellent edema control in his legs  bilaterally. He is using his external compression pumps twice a day. He has no open area on the right leg and only the areas in the left foot dorsally second and third toe area on the left side. He does not have any signs of active cellulitis. 10/06/2018; the patient has good edema control bilaterally. He has no open wound on the right leg. There is a blister in the posterior aspect of his left calf that we had to deal with today. He is using his compression pumps twice a day. There is no signs of active cellulitis. We have been using silver alginate to the wound areas. He still has vulnerable areas on the base of his left first second toes dorsally He has a his extremities stockings and we are going to transition him today into the stocking on the right leg. He is cautioned that he will need to continue to use the compression pumps twice a day. If he notices uncontrolled edema in the right leg he may need to go to 3 times a day. 10/13/2018; the patient came in for a nurse check on Friday he has a large flaccid blister on the right medial calf just below the knee. We unroofed this. He has this and a new area underneath the posterior mid calf which was undoubtedly a blister as well. He also has several small areas on the right which is the area we put his extremities stocking on. 10/19/2018; the patient went to see infectious disease this morning I am not sure if that was a routine follow-up in any case the doxycycline I had given him was discontinued and started on linezolid. He has not started this. It is easy to look at his left calf and the inflammation and think this is cellulitis however he is very tender in the tissue just below the popliteal fossa and I have no doubt that there is infection going on here. He states the problem he is having is that with the compression pumps the edema goes down and then starts walking the wrap falls down. We will see if we can adhere this. He has 1 or 2 minuscule  open areas on the right still areas that are weeping on the posterior left calf, the base of his left second and third toes 10/26/18; back today in clinic with quite of skin breakdown in his left anterior leg. This may have been infection the area below the popliteal fossa seems a lot better however tremendous epithelial loss on the left anterior mid tibia area over quite inexpensive tissue. He has 2 blisters on the right side but no other open wound here. 10/29/2018; came in urgently to see [place] today and we worked him in for review. He states that the 4 layer compression on the right leg caused pain he had to cut it down to roughly his mid calf this caused swelling above the wrap and he has blisters and skin breakdown today. As a result of the pain he has not been using his pumps. Both legs are a lot more edematous and there is  a lot of weeping fluid. 11/02/18; arrives in clinic with continued difficulties in the right leg> left. Leg is swollen and painful. multiple skin blisters and new open areas especially laterally. He has not been using his pumps on the right leg. 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 He is not systemically unwell. 11/09/2018; the patient claims he is being compliant with his pumps. He is finished the doxycycline I gave him last week. Culture I did of the wound on the right lateral leg showed a few very resistant methicillin staph aureus. This was resistant to doxycycline. Nevertheless he states the pain in the leg is a lot better which makes me wonder if the cultured organism was not really what was causing the problem nevertheless this is a very dangerous organism to be culturing out of any wound. His right leg is still a lot larger than the left. He is using an Radio broadcast assistant on this area, he blames a 4-layer compression for causing the original skin breakdown which I doubt is true however I cannot talk him out of it. We have been  using silver alginate to all of these areas which were initially blisters 11/16/2018; patient is being compliant with his external compression pumps at twice a day. Miraculously he arrives in clinic today with absolutely no open wounds. He has better edema control on the left where he has been using 4 layer compression versus wound of wounds on the right and I pointed this out to him. There is no inflammation in the skin in his lower legs which is also somewhat unusual for him. There is no open wounds on the dorsal left foot. He has extremitease stockings at home and I have asked him to bring these in next week. 11/25/18 patient's lower extremity on examination today on the left appears for the most part to be wound free. He does have an open wound on the lateral aspect of the right lower extremity but this is minimal compared to what I've seen in past. He does request that we go ahead and wrap the left leg as well even though there's nothing open just so hopefully it will not reopen in short order. 1/28; patient has superficial open wounds on the right lateral calf left anterior calf and left posterior calf. His edema control is adequate. 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 We have been using silver alginate as the primary dressing. He claims compliance with his compression pumps 2/4; patient has superficial open wounds on numerous areas of his left calf and again one on the left dorsal foot. The areas on the right lateral calf have healed. The cellulitis that I gave him doxycycline for last week is also resolved this was mostly on the left anterior calf just below the tibial tuberosity. His edema looks fairly well-controlled. He tells me he went to see his primary doctor today and had blood work ordered 2/11; once again he has several open areas on the left calf left tibial area. Most of these are small and  appear to have healthy granulation. He does not have anything open on the right. The edema and control in his thighs is pretty good which is usually a good indication he has been using his pumps as requested. 2/18; he continues to have several small areas on the left calf and left tibial area. Most of these  are small healthy granulation. We put him in his stocking on the right leg last week and he arrives with a superficial open area over the right upper tibia and a fairly large area on the right lateral tibia in similar condition. His edema control actually does not look too bad, he claims to be using his compression pumps twice a day 2/25. Continued small areas on the left calf and left tibial area. New areas especially on the right are identified just below the tibial tuberosity and on the right upper tibia itself. There are also areas of weeping edema fluid even without an obvious wound. He does not have a considerable degree of lymphedema but clearly there is more edema here than his skin can handle. He states he is using the pumps twice a day. We have an Unna boot on the right and 4 layer compression on the left. 3/3; he continues to have an area on the right lateral calf and right posterior calf just below the popliteal fossa. There is a fair amount of tenderness around the wound on the popliteal fossa but I did not see any evidence of cellulitis, could just be that the wrap came down and rubbed in this area. He does not have an open area on the left leg however there is an area on the left dorsal foot at the base of the third toe We have been using silver alginate to all wound areas 3/10; he did not have an open area on his left leg last time he was here a week ago. T oday he arrives with a horizontal wound just below the tibial tuberosity and an area on the left lateral calf. He has intense erythema and tenderness in this area. The area is on the right lateral calf and right posterior calf  better than last week. We have been using silver alginate as usual 3/18 - Patient returns with 3 small open areas on left calf, and 1 small open area on right calf, the skin looks ok with no significant erythema, he continues the UNA boot on right and 4 layer compression on left. The right lateral calf wound is closed , the right posterior is small area. we will continue silver alginate to the areas. 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 He is been tolerating the dressing changes without complication. Fortunately she has made excellent improvement in regard to the overall status of his wounds. Unfortunately every time we cease wrapping him he ends up reopening in causing more significant issues at that point. Again I'm unsure of the best direction to take although I think the lymphedema clinic may be appropriate for him. 02/03/19 on evaluation today patient appears to be doing well in regard to the wounds that we saw him for last week unfortunately he has a new area on the proximal portion of his right medial/posterior lower extremity where the wrap somewhat slowed down and caused swelling and a blister to rub and open. Unfortunately this is the only opening that he has on either leg at this point. 02/17/19 on evaluation today patient's bilateral lower extremities appear to be doing well. He still completely healed in regard to the left lower extremity. In regard to the right lower extremity the area where the wrap and slid down and caused the blister  still seems to be slightly open although this is dramatically better than during the last evaluation two weeks ago. I'm very pleased with the way this stands overall. 03/03/19 on evaluation today patient appears to be doing well in regard to his right lower  extremity in general although he did have a new blister open this does not appear to be showing any evidence of active infection at this time. Fortunately there's No fevers, chills, nausea, or vomiting noted at this time. Overall I feel like he is making good progress it does feel like that the right leg will we perform the [employer] seems to do with a bit better than three layer wrap on the left which slid down on him. We may switch to doing bilateral in the book wraps. 5/4; I have not seen Mr. Waterworth in quite some time. According to our case manager he did not have an open wound on his left leg last week. He had 1 remaining wound on the right posterior medial calf. He arrives today with multiple openings on the left leg probably were blisters and/or wrap injuries from Unna boots. I do not think the Unna boot's will provide adequate compression on the left. I am also not clear about the frequency he is using the compression pumps. 03/17/19 on evaluation today patient appears to be doing excellent in regard to his lower extremities compared to last week's evaluation apparently. He had gotten significantly worse last week which is unfortunate. The [employer] wrap on the left did not seem to do very well for him at all and in fact it didn't control his swelling significantly enough he had an additional outbreak. Subsequently we go back to the four layer compression wrap on the left. This is good news. At least in that he is doing better and the wound seem to be killing him. He still has not heard anything from the lymphedema clinic. 03/24/19 on evaluation today patient actually appears to be doing much better in regard to his bilateral lower Trinity as compared to last week when I saw him. Fortunately there's no signs of active infection at this time. He has been tolerating the dressing changes without complication. Overall I'm extremely pleased with the progress and appearance in general. 04/07/19 on  evaluation today patient appears to be doing well in regard to his bilateral lower extremities. His swelling is significantly down from where it was previous. With that being said he does have a couple blisters still open at this point but fortunately nothing that seems to be too severe and again the majority of the larger openings has healed at this time. 04/14/19 on evaluation today patient actually appears to be doing quite well in regard to his bilateral lower extremities in fact I'm not even sure there's anything significantly open at this time at any site. Nonetheless he did have some trouble with these wraps where they are somewhat irritating him secondary to the fact that he has noted that the graph wasn't too close down to the end of this foot in a little bit short as well up to his knee. Otherwise things seem to be doing quite well. 04/21/19 upon evaluation today patient's wound bed actually showed evidence of being completely healed in regard to both lower extremities which is excellent news. There does not appear to be any signs of active infection which is also good news. I'm very pleased in this regard. No fevers, chills, nausea, or vomiting noted at this time. 04/28/19  on evaluation today patient appears to be doing a little bit worse in regard to both lower extremities on the left mainly due to the fact that when he went infection disease the wrap was not wrapped quite high enough he developed a blister above this. On the right he is a small open area of nothing too significant but again this is continuing to give him some trouble he has been were in the Velcro compression that he has at home. 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 He's been tolerating the dressing changes without complication. Fortunately there's no signs of active infection at this time. No fevers, chills, nausea, or vomiting noted at this time. We have  been trying to get an appointment with her lymphedema clinic in [place] [facility] but unfortunately nobody can get them on phone with not been able to even fax information over the patient likewise is not been able to get in touch with them. Overall I'm not sure exactly what's going on here with to reach out again today. 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 Still having a lot of drainage unfortunately. He tells me especially in the left but even on the right. There's no signs of active infection which is good news we've been using so ratcheted up to this point. 05/19/19 on evaluation today patient actually appears to be doing quite well with regard to his left lower extremity which is great news. Fortunately in regard to the right lower extremity has an issues with his wrap and he subsequently did remove this from what I'm understanding. Nonetheless long story short is what he had rewrapped once he removed it subsequently had maggots underneath this wrap whenever he came in for evaluation today. With that being said they were obviously completely cleaned away by the nursing staff. The visit today which is excellent news. However he does appear to potentially have some infection around the right ankle region where the maggots were located as well. He will likely require anabiotic therapy today. 05/26/19 on evaluation today patient actually appears to be doing much better in regard to his bilateral lower extremities. I feel like the infection is under much better control. With that being said there were maggots noted when the wrap was removed yet again today. Again this could have potentially been left over from previous although at this time there does not appear to be any signs of significant drainage there was obviously on the wrap some drainage as well this contracted gnats or otherwise. Either way I do  not see anything that appears to be doing worse in my pinion and in fact I think his drainage has slowed down quite significantly likely mainly due to the fact to his infection being under better control. 06/02/2019 on evaluation today patient actually appears to be doing well with regard to his bilateral lower extremities there is no signs of active infection at this time which is great news. With that being said he does have several open areas more so on the right than the left but nonetheless these are all significantly better than previously noted. 06/09/2019 on evaluation today patient actually appears to be doing well. His wrap stayed up and he did not cause any problems he had more drainage on the right compared to the left but overall I do not see any major issues at  this time which is great news. 06/16/2019 on evaluation today patient appears to be doing excellent with regard to his lower extremities the only area that is open is a new blister that can have opened as of today on the medial ankle on the left. Other than this he really seems to be doing great I see no major issues at this point. 06/23/2019 on evaluation today patient appears to be doing quite well with regard to his bilateral lower extremities. In fact he actually appears to be almost completely healed there is a small area of weeping noted of the right lower extremity just above the ankle. Nonetheless fortunately there is no signs of active infection at this time which is good news. No fevers, chills, nausea, vomiting, or diarrhea. 8/24; the patient arrived for a nurse visit today but complained of very significant pain in the left leg and therefore I was asked to look at this. Noted that he did not have an open area on the left leg last week nevertheless this was wrapped. The patient states that he is not been able to put his compression pumps on the left leg because of the discomfort. He has not been systemically unwell 06/30/2019  on evaluation today patient unfortunately despite being excellent last week is doing much worse with regard to his left lower extremity today. In fact he had to come in for a nurse on Monday where his left leg had to be rewrapped due to excessive weeping Dr. Leanord Hawking placed him on doxycycline at that point. Fortunately there is no signs of active infection Systemically at this time which is good news. 07/07/2019 in regard to the patient's wounds today he actually seems to be doing well with his right lower extremity there really is nothing open or draining at this point this is great news. Unfortunately the left lower extremity is given him additional trouble at this time. There does not appear to be any signs of active infection nonetheless he does have a lot of edema and swelling noted at this point as well as blistering all of which has led to a much more poor appearing leg at this time compared to where it was 2 weeks ago when it was almost completely healed. Obviously this is a little discouraging for the patient. He is try to contact the lymphedema clinic in [place] he has not been able to get through to them. 07/14/2019 on evaluation today patient actually appears to be doing slightly better with regard to his left lower extremity ulcers. Overall I do feel like at least at the top of the wrap that we have been placing this area has healed quite nicely and looks much better. The remainder of the leg is showing signs of improvement. Unfortunately in the thigh area he still has an open region on the left and again on the right he has been utilizing just a Band-Aid on an area that also opened on the thigh. Again this is an area that were not able to wrap although we did do an Ace wrap to provide some compression that something that obviously is a little less effective than the compression wraps we have been using on the lower portion of the leg. He does have an appointment with the lymphedema clinic  in [facility] on Friday. 07/21/2019 on evaluation today patient appears to be doing better with regard to his lower extremity ulcers. He has been tolerating the dressing changes without complication. Fortunately there is no signs of active  infection at this time. No fevers, chills, nausea, vomiting, or diarrhea. I did receive the paperwork from the physical therapist at the lymphedema clinic in [place]. Subsequently I signed off on that this morning and sent that back to him for further progression with the treatment plan. 07/28/2019 on evaluation today patient appears to be doing very well with regard to his right lower extremity where I do not see any open wounds at this point. Fortunately he is feeling great as far as that is concerned as well. In regard to the left lower extremity he has been having issues with still several areas of weeping and edema although the upper leg is doing better his lower leg still I think is going require the compression wrap at this time. No fevers, chills, nausea, vomiting, or diarrhea. 08/04/2019 on evaluation today patient unfortunately is having new wounds on the right lower extremity. Again we have been using Unna boot wrap on that side. We switched him to using his juxta lite wrap at home. With that being said he tells me he has been using it although his legs extremely swollen and to be honest really does not appear that he has been. I cannot know that for sure however. Nonetheless he has multiple new wounds on the right lower extremity at this time. Obviously we will have to see about getting this rewrapped for him today. 08/11/2019 on evaluation today patient appears to be doing fairly well with regard to his wounds. He has been tolerating the dressing changes including the compression wraps without complication. He still has a lot of edema in his upper thigh regions bilaterally he is supposed to be seeing the lymphedema clinic on the 15th of this month  once his wraps arrive for the upper part of his legs. 08/18/2019 on evaluation today patient appears to be doing well with regard to his bilateral lower extremities at this point. He has been tolerating the dressing changes without complication. Fortunately there is no signs of active infection which is also good news. He does have a couple weeping areas on the first and second toe of the right foot he also has just a small area on the left foot upper leg and a small area on the left lower leg but overall he is doing quite well in my opinion. He is supposed to be getting his wraps shortly in fact tomorrow and then subsequently is seeing the lymphedema clinic next Wednesday on the 21st. Of note he is also leaving on the 25th to go on vacation for a week to the beach. For that reason and since there is some uncertainty about what there can be doing at lymphedema clinic next Wednesday I am get a make an appointment for next Friday here for [place] to see what we need to do for him prior to him leaving for vacation. 10/23; patient arrives in considerable pain predominantly in the upper posterior calf just distal to the popliteal fossa also in the wound anteriorly above the major wound. This is probably cellulitis and he has had this recurrently in the past. He has no open wound on the right side and he has had an Radio broadcast assistant in that area. Finally I note that he has an area on the left posterior calf which by enlarge is mostly epithelialized. This protrudes beyond the borders of the surrounding skin in the setting of dry scaly skin and lymphedema. The patient is leaving for [place] on Sunday. Per his longstanding pattern, he  will not take his compression pumps with him predominantly out of fear that they will be stolen. He therefore asked that we put a Unna boot back on the right leg. He will also contact the wound care center in [facility] to see if they can change his dressing in the mid week. 11/3;  patient returned from his vacation to [facility] He was seen on 1 occasion at their wound care center. They did a 2 layer compression system as they did not have our 4-layer wrap. I am not completely certain what they put on the wounds. They did not change the Unna boot on the right. The patient is also seeing a lymphedema specialist physical therapist in [place]. It appears that he has some compression sleeve for his thighs which indeed look quite a bit better than I am used to seeing. He pumps over these with his external compression pumps. 11/10; the patient has a new wound on the right medial thigh otherwise there is no open areas on the right. He has an area on the left leg posteriorly anteriorly and medially and an area over the left second toe. We have been using silver alginate. He thinks the injury on his thigh is secondary to friction from the compression sleeve he has. 11/17; the patient has a new wound on the right medial thigh last week. He thinks this is because he did not have a underlying stocking for his thigh juxta lite apparatus. He now has this. The area is fairly large and somewhat angry but I do not think he has underlying cellulitis. He has a intact blister on the right anterior tibial area. Small wound on the right great toe dorsally Small area on the medial left calf. 11/30; the patient does not have any open areas on his right leg and we did not take his juxta lite stocking off. However he states that on Friday his compression wrap fell down lodging around his upper mid calf area. As usual this creates a lot of problems for him. He called urgently today to be seen for a nurse visit however the nurse visit turned into a provider visit because of extreme erythema and pain in the left anterior tibia extending laterally and posteriorly. The area that is problematic is extensive 10/06/2019 upon evaluation today patient actually appears to be doing poorly in regard to his  left lower extremity. He Dr. Leanord Hawking did place him on doxycycline this past Monday apparently due to the fact that he was doing much worse in regard to this left leg. Fortunately the doxycycline does seem to be helping. Unfortunately we are still having a very difficult time getting his edema under any type of control in order to anticipate discharge at some point. The only way were really able to control his lymphedema really is with compression wraps and that has only even seemingly temporary. He has been seeing a lymphedema clinic they are trying to help in this regard but still this has been somewhat frustrating in general for the patient. 10/13/19 on evaluation today patient appears to be doing excellent with regard to his right lower extremity as far as the wounds are concerned. His swelling is still quite extensive unfortunately. He is still having a lot of drainage from the thigh areas bilaterally which is unfortunate. He's been going to lymphedema clinic but again he still really does not have this edema under control as far as his lower extremities are concern. With regard to his left lower  extremity this seems to be improving and I do believe the doxycycline has been of benefit for him. He is about to complete the doxycycline. 10/20/2019 on evaluation today patient appears to be doing poorly in regard to his bilateral lower extremities. More in the right thigh he has a lot of irritation at this site unfortunately. In regard to the left lower extremity the wrap was not quite as high it appears and does seem to have caused him some trouble as well. Fortunately there is no evidence of systemic infection though he does have some blue-green drainage which has me concerned for the possibility of Pseudomonas. He tells me he is previously taking Cipro without complications and he really does not care for Levaquin however due to some of the side effects he has. He is not allergic to any medications  specifically antibiotics that were aware of. 10/27/2019 on evaluation today patient actually does appear to be for the most part doing better when compared to last week's evaluation. With that being said he still has multiple open wounds over the bilateral lower extremities. He actually forgot to start taking the Cipro and states that he still has the whole bottle. He does have several new blisters on left lower extremity today I think I would recommend he go ahead and take the Cipro based on what I am seeing at this point. 12/30-Patient comes at 1 week visit, 4 layer compression wraps on the left and Unna boot on the right, primary dressing Xtrasorb and silver alginate. Patient is taking his Cipro and has a few more days left probably 5-6, and the legs are doing better. He states he is using his compressions devices which I believe he has 11/10/2019 on evaluation today patient actually appears to be much better than last time I saw him 2 weeks ago. His wounds are significantly improved and overall I am very pleased in this regard. Fortunately there is no signs of active infection at this time. He is just a couple of days away from completing Cipro. Overall his edema is much better he has been using his lymphedema pumps which I think is also helping at this point. 11/17/2019 on evaluation today patient appears to be doing excellent in regard to his wounds in general. His legs are swollen but not nearly as much as they have been in the past. Fortunately he is tolerating the compression wraps without complication. No fevers, chills, nausea, vomiting, or diarrhea. He does have some erythema however in the distal portion of his right lower extremity specifically around the forefoot and toes there is a little bit of warmth here as well. 11/24/2019 on evaluation today patient appears to be doing well with regard to his right lower extremity I really do not see any open wounds at this point. His left lower  extremity does have several open areas and his right medial thigh also is open. Other than this however overall the patient seems to be making good progress and I am very pleased at this point. 12/01/2019 on evaluation today patient appears to be doing poorly at this point in regard to his left lower extremity has several new blisters despite the fact that we have him in compression wraps. In fact he had a 4-layer compression wrap, his upper thigh wrapped from lymphedema clinic, and a juxta light over top of the 4 layer compression wrap the lymphedema clinic applied and despite all this he still develop blisters underneath. Obviously this does have me concerned about  the fact that unfortunately despite what we are doing to try to get wounds healed he continues to have new areas arise I do not think he is ever good to be at the point where he can realistically just use wraps at home to keep things under control. Typically when we heal him it takes about 1-2 days before he is back in the clinic with severe breakdown and blistering of his lower extremities bilaterally. This is happened numerous times in the past. Unfortunately I think that we may need some help as far as overall fluid overload to kind of limit what we are seeing and get things under better control. 12/08/2019 on evaluation today patient presents for follow-up concerning his ongoing bilateral lower extremity edema. Unfortunately he is still having quite a bit of swelling the compression wraps are controlling this to some degree but he did see Dr. [name] his cardiologist I do have that available for review today as far as the appointment was concerned that was on 12/06/2019. Obviously that she has been 2 days ago. The patient states that he is only been taking the Lasix 80 mg 1 time a day he had told me previously he was taking this twice a day. Nonetheless Dr. [name] recommended this be up to 80 mg 2 times a day for the patient as he did appear to  be fluid overloaded. With that being said the patient states he did this yesterday and he was unable to go anywhere or do anything due to the fact that he was constantly having to urinate. Nonetheless I think that this is still good to be something that is important for him as far as trying to get his edema under control at all things that he is going to be able to just expect his wounds to get under control and things to be better without going through at least a period of time where he is trying to stabilize his fluid management in general and I think increasing the Lasix is likely the first step here. It was also mentioned the possibility that the patient may require metolazone. With that being said he wanted to have the patient take Lasix twice a day first and then reevaluating 2 months to see where things stand. 12/15/2019 upon evaluation today patient appears to be doing regard to his legs although his toes are showing some signs of weeping especially on the left at this point to some degree on the right. There does not appear to be any signs of active infection and overall I do feel like the compression wraps are doing well for him but he has not been able to take the Lasix at home and the increased dose that Dr. [name] recommended. He tells me that just not go to be feasible for him. Nonetheless I think in this case he should probably send a message to Dr. [name] in order to discuss options from the standpoint of possible admission to get the fluid off or otherwise going forward. 12/22/2019 upon evaluation today patient appears to be doing fairly well with regard to his lower extremities at this point. In fact he would be doing excellent if it was not for the fact that his right anterior thigh apparently had an allergic reaction to adhesive tape that he used. The wound itself that we have been monitoring actually appears to be healed. There is a lot of irritation at this point. 12/29/2019 upon  evaluation today patient appears to be doing well in  regard to his lower extremities. His left medial thigh is open and somewhat draining today but this is the only region that is open the right has done much better with the treatment utilizing the steroid cream that I prescribed for him last week. Overall I am pleased in that regard. Fortunately there is no signs of active infection at this time. No fevers, chills, nausea, vomiting, or diarrhea. 01/05/2020 upon evaluation today patient appears to be doing more poorly in regard to his right lower extremity at this point upon evaluation today. Unfortunately he continues to have issues in this regard and I think the biggest issue is controlling his edema. This obviously is not very well controlled at this point is been recommended that he use the Lasix twice a day but he has not been able to do that. Unfortunately I think this is leading to an issue where honestly he is not really able to effectively control his edema and therefore the wounds really are not doing significantly better. I do not think that he is going to be able to keep things under good control unless he is able to control his edema much better. I discussed this again in great detail with him today. 01/12/2020 good news is patient actually appears to be doing quite well today at this point. He does have an appointment with lymphedema clinic tomorrow. His legs appear healed and the toe on the left is almost completely healed. In general I am very pleased with how things stand at this point. 01/19/2020 upon evaluation today patient appears to actually be doing well in regard to his lower extremities there is nothing open at this point. Fortunately he has done extremely well more recently. Has been seeing lymphedema clinic as well. With that being said he has Velcro wraps for his lower legs as well as his upper legs. The only wound really is on his toe which is the right great toe and this is  barely anything even there. With all that being said I think it is good to be appropriate today to go ahead and switch him over to the Velcro compression wraps. 01/26/2020 upon evaluation today patient appears to be doing worse with regard to his lower extremities after last week switch him to Velcro compression wraps. Unfortunately he lasted less than 24 hours he did not have the sock portion of his Velcro wrap on the left leg and subsequently developed a blister underneath the Velcro portion. Obviously this is not good and not what we were looking for at this point. He states the lymphedema clinic did tell him to wear the wrap for 23 hours and take him off for 1 I am okay with that plan but again right now we got a get things back under control again he may have some cellulitis noted as well. 02/02/2020 upon evaluation today patient unfortunately appears to have several areas of blistering on his bilateral lower extremities today mainly on the feet. His legs do seem to be doing somewhat better which is good news. Fortunately there is no evidence of active infection at this time. No fevers, chills, nausea, vomiting, or diarrhea. 02/16/2020 upon evaluation today patient appears to be doing well at this time with regard to his legs. He has a couple weeping areas on his toes but for the most part everything is doing better and does appear to be sealed up on his legs which is excellent news. We can continue with wrapping him at this point as  he had every time we discontinue the wraps he just breaks out with new wounds. There is really no point in is going forward with this at this point. 03/08/2020 upon evaluation today patient actually appears to be doing quite well with regard to his lower extremity ulcers. He has just a very superficial and really almost nonexistent blister on the left lower extremity he has in general done very well with the compression wraps. With that being said I do not see any signs  of infection at this time which is good news. 03/29/2020 upon evaluation today patient appears to be doing well with regard to his wounds currently except for where he had several new areas that opened up due to some of the wrap slipping and causing him trouble. He states he did not realize they had slipped. Nonetheless he has a 1 area on the right and 3 new areas on the left. Fortunately there is no signs of active infection at this time which is great news. 04/05/2020 upon evaluation today patient actually appears to be doing quite well in general in regard to his legs currently. Fortunately there is no signs of active infection at this time. No fevers, chills, nausea, vomiting, or diarrhea. He tells me next week that he will actually be seen in the lymphedema clinic on Thursday at 10 AM I see him on Wednesday next week. 04/12/2020 upon evaluation today patient appears to be doing very well with regard to his lower extremities bilaterally. In fact he does not appear to have any open wounds at this point which is good news. Fortunately there is no signs of active infection at this time. No fevers, chills, nausea, vomiting, or diarrhea. 04/19/2020 upon evaluation today patient appears to be doing well with regard to his wounds currently on the bilateral lower extremities. There does not appear to be any signs of active infection at this time. Fortunately there is no evidence of systemic infection and overall very pleased at this point. Nonetheless after I held him out last week he literally had blisters the next morning already which swelled up with him being right back here in the clinic. Overall I think that he is just not can be able to be discharged with his legs the way they are he is much to volume overloaded as far as fluid is concerned and that was discussed with him today of also discussed this but should try the clinic nurse manager as well as Dr. Leanord Hawking. 04/26/2020 upon evaluation today patient  appears to be doing better with regard to his wounds currently. He is making some progress and overall swelling is under good control with the compression wraps. Fortunately there is no evidence of active infection at this time. 05/10/2020 on evaluation today patient appears to be doing overall well in regard to his lower extremities bilaterally. He is Tolerating the compression wraps without complication and with what we are seeing currently I feel like that he is making excellent progress. There is no signs of active infection at this time. 05/24/2020 upon evaluation today patient appears to be doing well in regard to his legs. The swelling is actually quite a bit down compared to where it has been in the past. Fortunately there is no sign of active infection at this time which is also good news. With that being said he does have several wounds on his toes that have opened up at this point. 05/31/2020 upon evaluation today patient appears to be doing well with  regard to his legs bilaterally where he really has no significant fluid buildup at this point overall he seems to be doing quite well. Very pleased in this regard. With regard to his toes these also seem to be drying up which is excellent. We have continue to wrap him as every time we tried as a transition to the juxta light wraps things just do not seem to get any better. 06/07/2020 upon evaluation today patient appears to be doing well with regard to his right leg at this point. Unfortunately left leg has a lot of blistering he tells me the wrap started to slide down on him when he tried to put his other Velcro wrap over top of it to help keep things in order but nonetheless still had some issues. 06/14/2020 on evaluation today patient appears to be doing well with regard to his lower extremity ulcers and foot ulcers at this point. I feel like everything is actually showing signs of improvement which is great news overall there is no signs of active  infection at this time. No fevers, chills, nausea, vomiting, or diarrhea. 06/21/2020 on evaluation today patient actually appears to be doing okay in regard to his wounds in general. With that being said the biggest issue I see is on his right foot in particular the first and second toe seem to be doing a little worse due to the fact this is staying very wet. I think he is probably getting need to change out his dressings a couple times in between each week when we see him in regard to his toes in order to keep this drier based on the location and how this is proceeding. 06/28/2020 on evaluation today patient appears to be doing a little bit more poorly overall in regard to the appearance of the skin I am actually somewhat concerned about the possibility of him having a little bit of an infection here. We discussed the course of potentially giving him a doxycycline prescription which he is taken previously with good result. With that being said I do believe that this is potentially mild and at this point easily fixed. I just do not want anything to get any worse. 07/12/2020 upon evaluation today patient actually appears to be making some progress with regard to his legs which is great news there does not appear to be any evidence of active infection. Overall very pleased with where things stand. 07/26/2020 upon evaluation today patient appears to be doing well with regard to his leg ulcers and toe ulcers at this point. He has been tolerating the compression wraps without complication overall very pleased in this regard. 08/09/2020 upon evaluation today patient appears to be doing well with regard to his lower extremities bilaterally. Fortunately there is no signs of active infection overall I am pleased with where things stand. 08/23/2020 on evaluation today patient appears to be doing well with regard to his wound. He has been tolerating the dressing changes without complication. Fortunately there is no  signs of active infection at this time. Overall his legs seem to be doing quite well which is great news and I am very pleased in that regard. No fevers, chills, nausea, vomiting, or diarrhea. 09/13/2020 upon evaluation today patient appears to be doing okay in regard to his lower extremities. He does have a fairly large blister on the right leg which I did remove the blister tissue from today so we can get this to dry out other than that however he seems  to be doing quite well. There is no signs of active infection at this time. 09/27/2020 upon evaluation today patient appears to actually be doing some better in regard to his right leg. Fortunately signs of active infection at this time which is great news. No fevers, chills, nausea, vomiting, or diarrhea. 10/04/2020 upon evaluation today patient actually appears to be showing signs of improvement which is great news with regard to his leg ulcers. Fortunately there is no signs of active infection which is great news he is still taking the antibiotics currently. No fevers, chills, nausea, vomiting, or diarrhea. 10/18/2020 on evaluation today patient appears to be doing well with regard to his legs currently. He has been tolerating the dressing changes including the wraps without complication. Fortunately there is no signs of active infection at this time. No fevers, chills, nausea, vomiting, or diarrhea. 10/25/2020 upon evaluation today patient appears to be doing decently well in regard to his wounds currently. He has been tolerating the dressing changes without complication. Overall I feel like he is making good progress albeit slow. Again this is something we can have to continue to wrap for some time to come most likely. 11/08/2020 upon evaluation today patient appears to be doing well with regard to his wounds currently. He has been tolerating the dressing changes without complication is not currently on any antibiotics and he does not appear to  show any signs of infection. He does continue to have a lot of drainage on the right leg not too severe but nonetheless this is very scattered. On the left leg this is looking to be much improved overall. 11/15/2020 upon evaluation today patient appears to be doing better with regard to his legs bilaterally. Especially the right leg which was much more significant last week. There does not appear to be any signs of active infection which is great news. No fevers, chills, nausea, vomiting, or diarrhea. 11/23/2019 upon evaluation today patient appears to be doing poorly still in regard to his lower extremities bilaterally. Unfortunately his right leg in particular appears to be doing much more poorly there is no signs really of infection this is not warm to touch but he does have a lot of drainage and weeping unfortunately. With that reason I do believe that we may need to initiate some treatment here to try to help calm down some of the swelling of the right leg. I think switching to a 4-layer compression wrap would be beneficial here. The patient is in agreement with giving this a try. 11/29/2020 upon evaluation today patient appears to be doing well currently in regard to his leg ulcers. I feel like the right leg is doing better he still has a lot of drainage but we do see some improvement here. The 4-layer compression wrap I think was helpful. 12/06/2020 upon evaluation today patient appears to be doing well with regard to his legs. In fact they seem to be doing about the best I have seen up to this point. Fortunately there is no signs of active infection at this time. No fevers, chills, nausea, vomiting, or diarrhea. Electronic Signature(s) Signed: 12/06/2020 11:18:32 AM By: Lenda Kelp PA-C Entered By: Lenda Kelp on 12/06/2020 11:18:31 -------------------------------------------------------------------------------- Physical Exam Details Patient Name: Date of Service: [facility], Eliasar J.  12/06/2020 10:30 A M Medical Record Number: [id number] Patient Account Number: [id number] Date of Birth/Sex: Treating RN: 1950-11-06 (70 y.o. [name] Primary Care Provider: Nicoletta Ba Other Clinician: Referring Provider: Treating Provider/Extender: Larina Bras  III, [name], Quincy Simmonds in Treatment: 254 Constitutional Well-nourished and well-hydrated in no acute distress. Respiratory normal breathing without difficulty. Psychiatric this patient is able to make decisions and demonstrates good insight into disease process. Alert and Oriented x 3. pleasant and cooperative. Notes Upon inspection patient's wounds again on the right leg which are mainly large areas of weeping have improved dramatically and in fact I see very little weeping at this point. I do believe the 4-layer compression wrap is also done better for him. The combination of that and the ketoconazole along with triamcinolone has been extremely beneficial. Electronic Signature(s) Signed: 12/06/2020 11:18:55 AM By: Lenda Kelp PA-C Entered By: Lenda Kelp on 12/06/2020 11:18:55 -------------------------------------------------------------------------------- Physician Orders Details Patient Name: Date of Service: [name], [name] 12/06/2020 10:30 A M Medical Record Number: [id number] Patient Account Number: [id number] Date of Birth/Sex: Treating RN: 1950/12/24 (70 y.o. 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 - MD Return A Nurse Visit: - 1 week Bathing/ Shower/ Hygiene May shower with protection but do not get wound dressing(s) wet. Edema Control - Lymphedema / SCD / Other Bilateral Lower Extremities Lymphedema Pumps. Use Lymphedema pumps on leg(s) 2-3 times a day for 45-60 minutes. If wearing any wraps or hose, do not remove them. Continue exercising as instructed. Elevate legs to the level of the heart or above for 30 minutes daily and/or when sitting, a frequency of: - throughout the day Avoid standing for long periods of time. Exercise regularly Other Edema Control Orders/Instructions: - 4 layer compression wrap to left leg Non Wound Condition Left Lower Extremity Other Non Wound Condition Orders/Instructions: - lotion to leg, 4 layer compression wrap Wound Treatment Wound #185 - Lower Leg Wound Laterality: Right, Circumferential Peri-Wound Care: Ketoconazole Cream 2% 1 x Per Week Discharge Instructions: Apply Ketoconazole mixed with TCA and zinc to lower leg Peri-Wound Care: Triamcinolone 15 (g) 1 x Per Week Discharge Instructions: Use triamcinolone 15 (g) as directed Peri-Wound Care: Zinc Oxide Ointment 30g tube 1 x Per Week Discharge Instructions: Apply Zinc Oxide to periwound with each dressing change Prim Dressing: KerraCel Ag Gelling Fiber Dressing, 4x5 in (silver alginate) 1 x Per Week ary Discharge Instructions: Apply silver alginate to wound bed as instructed Secondary Dressing: ABD Pad, 8x10 1 x Per Week Discharge Instructions: Apply over primary dressing as directed. Secondary Dressing: Zetuvit Plus 4x8 in 1 x Per Week Discharge Instructions: Apply over primary dressing as needed Compression Wrap: FourPress (4 layer compression wrap) 1 x Per  Week Discharge Instructions: Apply four layer compression unna layer at top to secure Electronic Signature(s) Signed: 12/06/2020 5:21:21 PM By: Lenda Kelp PA-C Signed: 12/06/2020 5:50:42 PM By: Zenaida Deed RN, BSN Entered By: Zenaida Deed on 12/06/2020 11:18:54 -------------------------------------------------------------------------------- Problem List Details Patient Name: Date of Service: [name], Dvaughn J. 12/06/2020 10:30 A M Medical Record Number: [id number] Patient Account Number: [id number] Date of Birth/Sex: Treating RN:  [birth year] (70 y.o. [name] Primary Care Provider: Nicoletta Ba Other Clinician: Referring Provider: Treating Provider/Extender: Adele Dan in Treatment: 254 Active Problems ICD-10 Encounter Code Description Active Date MDM Diagnosis 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 skin 06/30/2018 No Yes L97.221 Non-pressure chronic ulcer of left calf limited to breakdown of skin 09/30/2016 No Yes 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 of 01/22/2016 No Yes bilateral lower extremity I89.0 Lymphedema, not elsewhere classified 01/22/2016 No Yes E11.622 Type 2 diabetes mellitus with other skin ulcer 01/22/2016 No Yes 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, unspecified 01/22/2016 No Yes L03.116 Cellulitis of left lower limb 04/01/2017 No Yes Inactive Problems ICD-10 Code Description Active Date Inactive Date 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 skin 06/30/2017 06/30/2017 L97.521 Non-pressure chronic ulcer of other 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 of skin 04/27/2018 04/27/2018 L03.115 Cellulitis of right lower limb 12/22/2017 12/22/2017 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 06/30/2018 06/30/2018 L97.511 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  breakdown of skin 06/30/2018 06/30/2018 Resolved Problems Electronic Signature(s) Signed: 12/06/2020 10:36:48 AM By: Lenda Kelp PA-C Entered By: Lenda Kelp on 12/06/2020 10:36:47 -------------------------------------------------------------------------------- Progress Note Details Patient Name: Date of Service: [name], Skylor J. 12/06/2020 10:30 A M Medical Record Number: [id number] Patient Account Number: [id number] Date of Birth/Sex: Treating RN: [birth year] (70 y.o. [name] Primary Care Provider: Nicoletta Ba Other Clinician: Referring Provider: Treating Provider/Extender: Adele Dan in Treatment: [phone] Subjective Chief Complaint Information obtained from Patient patient is here for evaluation venous/lymphedema weeping History of Present Illness (HPI) Referred by PCP for consultation. Patient has long standing history of BLE venous stasis, no prior ulcerations. At beginning of month, developed cellulitis and weeping. Received IM Rocephin followed by Keflex and resolved. Wears compression stocking, appr 6 months old. Not sure strength. No present drainage. 01/22/16 this is a patient who is a type II diabetic on insulin. 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 He tells me he religiously wears pressure stockings of uncertain strength. He was here with weeping edema about 8 months ago but did not have an open wound. Roughly a month ago he had a reopening on his bilateral legs. He is been using bandages and Neosporin. He does not complain of pain. 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 Last BUN/creatinine I have is from 11/20/15 at 13 and 1.0 respectively 01/29/16; patient arrives today having tolerated the Profore wrap. He brought in his stockings and these are 18 mmHg stockings he bought from [place].  The compression here is likely inadequate. He does not complain of pain or excessive drainage she has no systemic symptoms. The wound on the right looks improved as does the one on the left although one on the left is more substantial with still tissue at risk below the actual wound area on the bilateral posterior calf 02/05/16; patient arrives with poor edema control. He states that we did put a 4 layer compression on it last week. No weight appear 5 this. 02/12/16; the area on the posterior right Has healed. The left Has a substantial wound that has necrotic surface eschar that requires a debridement with a curette. 02/16/16;the patient called or a Nurse  visit secondary to increased swelling. He had been in earlier in the week with his right leg healed. He was transitioned to is on pressure stocking on the right leg with the only open wound on the left, a substantial area on the left posterior calf. Note he has a history of severe lower extremity edema, he has a history of chronic atrial fibrillation but not heart failure per my notes but I'll need to research this. He is not complaining of chest pain shortness of breath or orthopnea. The intake nurse noted blisters on the previously closed right leg 02/19/16; this is the patient's regular visit day. I see him on Friday with escalating edema new wounds on the right leg and clear signs of at least right ventricular heart failure. I increased his Lasix to 40 twice a day. He is returning currently in follow-up. States he is noticed a decrease in that the edema 02/26/16 patient's legs have much less edema. There is nothing really open on the right leg. The left leg has improved condition of the large superficial wound on the posterior left leg 03/04/16; edema control is very much better. The patient's right leg wounds have healed. On the left leg he continues to have severe venous inflammation on the posterior aspect of the left leg. There is no tenderness and  I don't think any of this is cellulitis. 03/11/16; patient's right leg is married healed and he is in his own stocking. The patient's left leg has deteriorated somewhat. There is a lot of erythema around the wound on the posterior left leg. There is also a significant rim of erythema posteriorly just above where the wrap would've ended there is a new wound in this location and a lot of tenderness. Can't rule out cellulitis in this area. 03/15/16; patient's right leg remains healed and he is in his own stocking. The patient's left leg is much better than last review. His major wound on the posterior aspect of his left Is almost fully epithelialized. He has 3 small injuries from the wraps. Really. Erythema seems a lot better on antibiotics 03/18/16; right leg remains healed and he is in his own stocking. The patient's left leg is much better. The area on the posterior aspect of the left calf is fully epithelialized. His 3 small injuries which were wrap injuries on the left are improved only one seems still open his erythema has resolved 03/25/16; patient's right leg remains healed and he is in his own stocking. There is no open area today on the left leg posterior leg is completely closed up. His wrap injuries at the superior aspect of his leg are also resolved. He looks as though he has some irritation on the dorsal ankle but this is fully epithelialized without evidence of infection. 03/28/16; we discharged this patient on Monday. Transitioned him into his own stocking. There were problems almost immediately with uncontrolled swelling weeping edema multiple some of which have opened. He does not feel systemically unwell in particular no chest pain no shortness of breath and he does not feel 04/08/16; the edema is under better control with the Profore light wrap but he still has pitting edema. There is one large wound anteriorly 2 on the medial aspect of his left leg and 3 small areas on the superior posterior  calf. Drainage is not excessive he is tolerating a Profore light well 04/15/16; put a Profore wrap on him last week. This is controlled is edema however he had a lot of  pain on his left anterior foot most of his wounds are healed 04/22/16 once again the patient has denuded areas on the left anterior foot which he states are because his wrap slips up word. He saw his primary physician today is on Lasix 40 twice a day and states that he his weight is down 20 pounds over the last 3 months. 04/29/16: Much improved. left anterior foot much improved. He is now on Lasix 80 mg per day. Much improved edema control 05/06/16; I was hoping to be able to discharge him today however once again he has blisters at a low level of where the compression was placed last week mostly on his left lateral but also his left medial leg and a small area on the anterior part of the left foot. 05/09/16; apparently the patient went home after his appointment on 7/4 later in the evening developing pain in his upper medial thigh together with subjective fever and chills although his temperature was not taken. The pain was so intense he felt he would probably have to call 911. However he then remembered that he had leftover doxycycline from a previous round of antibiotics and took these. By the next morning he felt a lot better. He called and spoke to one of our nurses and I approved doxycycline over the phone thinking that this was in relation to the wounds we had previously seen although they were definitely were not. The patient feels a lot better old fever no chills he is still working. Blood sugars are reasonably controlled 05/13/16; patient is back in for review of his cellulitis on his anterior medial upper thigh. He is taking doxycycline this is a lot better. Culture I did of the nodular area on the dorsal aspect of his foot grew MRSA this also looks a lot better. 05/20/16; the patient is cellulitis on the medial upper thigh has  resolved. All of his wound areas including the left anterior foot, areas on the medial aspect of the left calf and the lateral aspect of the calf at all resolved. He has a new blister on the left dorsal foot at the level of the fourth toe this was excised. No evidence of infection 05/27/16; patient continues to complain weeping edema. He has new blisterlike wounds on the left anterior lateral and posterior lateral calf at the top of his wrap levels. The area on his left anterior foot appears better. He is not complaining of fever, pain or pruritus in his feet. 05/30/16; the patient's blisters on his left anterior leg posterior calf all look improved. He did not increase the Lasix 100 mg as I suggested because he was going to run out of his 40 mg tablets. He is still having weeping edema of his toes 06/03/16; I renewed his Lasix at 80 mg once a day as he was about to run out when I last saw him. He is on 80 mg of Lasix now. I have asked him to cut down on the excessive amount of water he was drinking and asked him to drink according to his thirst mechanisms 06/12/2016 -- was seen 2 days ago and was supposed to wear his compression stockings at home but he is developed lymphedema and superficial blisters on the left lower extremity and hence came in for a review 06/24/16; the remaining wound is on his left anterior leg. He still has edema coming from between his toes. There is lymphedema here however his edema is generally better than when I last  saw this. He has a history of atrial fibrillation but does not have a known history of congestive heart failure nevertheless I think he probably has this at least on a diastolic basis. 07/01/16 I reviewed his echocardiogram from January 2017. This was essentially normal. He did not have LVH, EF of 55-60%. His right ventricular function was normal although he did have trivial tricuspid and pulmonic regurgitation. This is not audible on exam however. I increased his  Lasix to do massive edema in his legs well above his knees I think in early July. He was also drinking an excessive amount of water at the time. 07/15/16; missed his appointment last week because of the Labor Day holiday on Monday. He could not get another appointment later in the week. Started to feel the wrap digging in superiorly so we remove the top half and the bottom half of his wrap. He has extensive erythema and blistering superiorly in the left leg. Very tender. Very swollen. Edema in his foot with leaking edema fluid. He has not been systemically unwell 07/22/16; the area on the left leg laterally required some debridement. The medial wounds look more stable. His wrap injury wounds appear to have healed. Edema and his foot is better, weeping edema is also better. He tells me he is meeting with the supplier of the external compression pumps at work 08/05/16; the patient was on vacation last week in [facility]. His wrap is been on for an extended period of time. Also over the weekend he developed an extensive area of tender erythema across his anterior medial thigh. He took to doxycycline yesterday that he had leftover from a previous prescription. The patient complains of weeping edema coming out of his toes 08/08/16; I saw this patient on 10/2. He was tender across his anterior thigh. I put him on doxycycline. He returns today in follow-up. He does not have any open wounds on his lower leg, he still has edema weeping into his toes. 08/12/16; patient was seen back urgently today to follow-up for his extensive left thigh cellulitis/erysipelas. He comes back with a lot less swelling and erythema pain is much better. I believe I gave him Augmentin and Cipro. His wrap was cut down as he stated a roll down his legs. He developed blistering above the level of the wrap that remained. He has 2 open blisters and 1 intact. 08/19/16; patient is been doing his primary doctor who is increased his Lasix from  40-80 once a day or 80 already has less edema. Cellulitis has remained improved in the left thigh. 2 open areas on the posterior left calf 08/26/16; he returns today having new open blisters on the anterior part of his left leg. He has his compression pumps but is not yet been shown how to use some vital representative from the supplier. 09/02/16 patient returns today with no open wounds on the left leg. Some maceration in his plantar toes 09/10/2016 -- Dr. Leanord Hawking had recently discharged him on 09/02/2016 and he has come right back with redness swelling and some open ulcers on his left lower extremity. He says this was caused by trying to apply his compression stockings and he's been unable to use this and has not been able to use his lymphedema pumps. He had some doxycycline leftover and he has started on this a few days ago. 09/16/16; there are no open wounds on his leg on the left and no evidence of cellulitis. 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 He does not complain of symptoms here. I am not clear use using his external compression pumps. 09/23/16; I have not seen this patient in 2 weeks. He canceled his appointment 10 days ago as he was going on vacation. He tells me that on Monday he noticed a large area on his posterior left leg which is been draining copiously and is reopened into a large wound. He is been using ABDs and the external part of his juxtalite, according to our nurse this was not on properly. 10/07/16; Still a substantial area on the posterior left leg. Using silver alginate 10/14/16; in general better although there is still open area which looks healthy. Still using silver alginate. He reminds me that this happen before he left for [facility]. T oday while he was showering in the morning. He had been using his juxtalite's 10/21/16; the area on his posterior left leg is fully epithelialized. However he arrives  today with a large area of tender erythema in his medial and posterior left thigh just above the knee. I have marked the area. Once again he is reluctant to consider hospitalization. I treated him with oral antibiotics in the past for a similar situation with resolution I think with doxycycline however this area it seems more extensive to me. He is not complaining of fever but does have chills and says states he is thirsty. His blood sugar today was in the 140s at home 10/25/16 the area on his posterior left leg is fully epithelialized although there is still some weeping edema. The large area of tenderness and erythema in his medial and posterior left thigh is a lot less tender although there is still a lot of swelling in this thigh. He states he feels a lot better. He is on doxycycline and Augmentin that I started last week. This will continued until Tuesday, December 26. I have ordered a duplex ultrasound of the left thigh rule out DVT whether there is an abscess something that would need to be drained I would also like to know. 11/01/16; he still has weeping edema from a not fully epithelialized area on his left posterior calf. Most of the rest of this looks a lot better. He has completed his antibiotics. His thigh is a lot better. Duplex ultrasound did not show a DVT in the thigh 11/08/16; he comes in today with more Denuded surface epithelium from the posterior aspect of his calf. There is no real evidence of cellulitis. The superior aspect of his wrap appears to have put quite an indentation in his leg just below the knee and this may have contributed. He does not complain of pain or fever. We have been using silver alginate as the primary dressing. The area of cellulitis in the right thigh has totally resolved. He has been using his compression stockings once a week 11/15/16; the patient arrives today with more loss of epithelium from the posterior aspect of his left calf. He now has a fairly  substantial wound in this area. The reason behind this deterioration isn't exactly clear although his edema is not well controlled. He states he feels he is generally more swollen systemically. He is not complaining of chest pain shortness of breath fever. T me he has an appointment with his primary physician in early February. He is on 80 mg of oral ells Lasix a day. He claims compliance with the external compression pumps. He is not having any pain in  his legs similar to what he has with his recurrent cellulitis 11/22/16; the patient arrives a follow-up of his large area on his left lateral calf. This looks somewhat better today. He came in earlier in the week for a dressing change since I saw him a week ago. He is not complaining of any pain no shortness of breath no chest pain 11/28/16; the patient arrives for follow-up of his large area on the left lateral calf this does not look better. In fact it is larger weeping edema. The surface of the wound does not look too bad. We have been using silver alginate although I'm not certain that this is a dressing issue. 12/05/16; again the patient follows up for a large wound on the left lateral and left posterior calf this does not look better. There continues to be weeping edema necrotic surface tissue. More worrisome than this once again there is erythema below the wound involving the distal Achilles and heel suggestive of cellulitis. He is on his feet working most of the day of this is not going well. We are changing his dressing twice a week to facilitate the drainage. 12/12/16; not much change in the overall dimensions of the large area on the left posterior calf. This is very inflamed looking. I gave him an. Doxycycline last week does not really seem to have helped. He found the wrap very painful indeed it seems to of dog into his legs superiorly and perhaps around the heel. He came in early today because the drainage had soaked through his  dressings. 12/19/16- patient arrives for follow-up evaluation of his left lower extremity ulcers. He states that he is using his lymphedema pumps once daily when there is "no drainage". He admits to not using his lipedema pumps while under current treatment. His blood sugars have been consistently between 150-200. 12/26/16; the patient is not using his compression pumps at home because of the wetness on his feet. I've advised him that I think it's important for him to use this daily. He finds his feet too wet, he can put a plastic bag over his legs while he is in the pumps. Otherwise I think will be in a vicious circle. We are using silver alginate to the major area on his left posterior calf 01/02/17; the patient's posterior left leg has further of all into 3 open wounds. All of them covered with a necrotic surface. He claims to be using his compression pumps once a day. His edema control is marginal. Continue with silver alginate 01/10/17; the patient's left posterior leg actually looks somewhat better. There is less edema, less erythema. Still has 3 open areas covered with a necrotic surface requiring debridement. He claims to be using his compression pumps once a day his edema control is better 01/17/17; the patient's left posterior calf look better last week when I saw him and his wrap was changed 2 days ago. He has noted increasing pain in the left heel and arrives today with much larger wounds extensive erythema extending down into the entire heel area especially tender medially. He is not systemically unwell CBGs have been controlled no fever. Our intake nurse showed me limegreen drainage on his AVD pads. 01/24/17; his usual this patient responds nicely to antibiotics last week giving him Levaquin for presumed Pseudomonas. The whole entire posterior part of his leg is much better much less inflamed and in the case of his Achilles heel area much less tender. He has also had some epithelialization  posteriorly there are still open areas here and still draining but overall considerably better 01/31/17- He has continue to tolerate the compression wraps. he states that he continues to use the lymphedema pumps daily, and can increase to twice daily on the weekends. He is voicing no complaints or concerns regarding his LLE ulcers 02/07/17-he is here for follow-up evaluation. He states that he noted some erythema to the left medial and anterior thigh, which he states is new as of yesterday. He is concerned about recurrent cellulitis. He states his blood sugars have been slightly elevated, this morning in the 180s 02/14/17; he is here for follow-up evaluation. When he was last here there was erythema superiorly from his posterior wound in his anterior thigh. He was prescribed Levaquin however a culture of the wound surface grew MRSA over the phone I changed him to doxycycline on Monday and things seem to be a lot better. 02/24/17; patient missed his appointment on Friday therefore we changed his nurse visit into a physician visit today. Still using silver alginate on the large area of the posterior left thigh. He isn't new area on the dorsal left second toe 03/03/17; actually better today although he admits he has not used his external compression pumps in the last 2 days or so because of work responsibilities over the weekend. 03/10/17; continued improvement. External compression pumps once a day almost all of his wounds have closed on the posterior left calf. Better edema control 03/17/17; in general improved. He still has 3 small open areas on the lateral aspect of his left leg however most of the area on the posterior part of his leg is epithelialized. He has better edema control. He has an ABD pad under his stocking on the right anterior lower leg although he did not let us look at that today. 03/24/17; patient arrives back in clinic today with no open areas however there are areas on the posterior left  calf and anterior left calf that are less than 100% epithelialized. His edema is well controlled in the left lower leg. There is some pitting edema probably lymphedema in the left upper thigh. He uses compression pumps at home once per day. I tried to get him to do this twice a day although he is very reticent. 04/01/2017 -- for the last 2 days he's had significant redness, tenderness and weeping and came in for an urgent visit today. 04/07/17; patient still has 6 more days of doxycycline. He was seen by Dr. Meyer Russel last Wednesday for cellulitis involving the posterior aspect, lateral aspect of his Involving his heel. For the most part he is better there is less erythema and less weeping. He has been on his feet for 12 hours o2 over the weekend. Using his compression pumps once a day 04/14/17 arrives today with continued improvement. Only one area on the posterior left calf that is not fully epithelialized. He has intense bilateral venous inflammation associated with his chronic venous insufficiency disease and secondary lymphedema. We have been using silver alginate to the left posterior calf wound In passing he tells [place] today that the right leg but we have not seen in quite some time has an open area on it but he doesn't want [place] to look at this today states he will show this to [place] next week. 04/21/17; there is no open area on his left leg although he still reports some weeping edema. He showed [place] his right leg today which is the first time we've seen this  leg in a long time. He has a large area of open wound on the right leg anteriorly healthy granulation. Quite a bit of swelling in the right leg and some degree of venous inflammation. He told us about the right leg in passing last week but states that deterioration in the right leg really only happened over the weekend 04/28/17; there is no open area on the left leg although there is an irritated part on the posterior which is like a wrap injury. The  wound on the right leg which was new from last week at least to [place] is a lot better. 05/05/17; still no open area on the left leg. Patient is using his new compression stocking which seems to be doing a good job of controlling the edema. He states he is using his compression pumps once per day. The right leg still has an open wound although it is better in terms of surface area. Required debridement. A lot of pain in the posterior right Achilles marked tenderness. Usually this type of presentation this patient gives concern for an active cellulitis 05/12/17; patient arrives today with his major wound from last week on the right lateral leg somewhat better. Still requiring debridement. He was using his compression stocking on the left leg however that is reopened with superficial wounds anteriorly he did not have an open wound on this leg previously. He is still using his juxta light's once daily at night. He cannot find the time to do this in the morning as he has to be at work by 7 AM 05/19/17; right lateral leg wound looks improved. No debridement required. The concerning area is on the left posterior leg which appears to almost have a subcutaneous hemorrhagic component to it. We've been using silver alginate to all the wounds 05/26/17; the right lateral leg wound continues to look improved. However the area on the left posterior calf is a tightly adherent surface. [place] using silver alginate. Because of the weeping edema in his legs there is very little good alternatives. 06/02/17; the patient left here last week looking quite good. Major wound on the left posterior calf and a small one on the right lateral calf. Both of these look satisfactory. He tells me that by Wednesday he had noted increased pain in the left leg and drainage. He called on Thursday and Friday to get an appointment here but we were blocked. He did not go to urgent care or his primary physician. He thinks he had a fever on Thursday  but did not actually take his temperature. He has not been using his compression pumps on the left leg because of pain. I advised him to go to the emergency room today for IV antibiotics for stents of left leg cellulitis but he has refused I have asked him to take 2 days off work to keep his leg elevated and he has refused this as well. In view of this I'm going to call him and Augmentin and doxycycline. 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 However after a detailed discussion regarding the risk benefits and alternatives he refuses to get admitted to the hospital. With no other recourse I will continue him on oral antibiotics as before and hopefully he'll have his  infectious disease consultation this week. 06/16/2017 -- the patient was seen today by the nurse practitioner at infectious disease Ms. Dixon. 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 He has also been advise Lotrimin cream locally. she also had wise IV antibiotics for his condition if it flares up 06/23/17; patient arrives today with drainage bilaterally although the remaining wound on the left posterior calf after cleaning up today "highlighter yellow drainage" did not look too bad. Unfortunately he has had breakdown on the right anterior leg [previously this leg had not been open and he is using a black stocking] he went to see infectious disease and is been put on clindamycin 150 mg daily, I did not verify the dose although I'm not familiar with using clindamycin in this dosing range, perhaps for  prophylaxisoo 06/27/17; I brought this patient back today to follow-up on the wound deterioration on the right lower leg together with surrounding cellulitis. I started him on doxycycline 4 days ago. This area looks better however he comes in today with intense cellulitis on the medial part of his left thigh. This is not have a wound in this area. Extremely tender. We've been using silver alginate to the wounds on the right lower leg left lower leg with bilateral 4 layer compression he is using his external compression pumps once a day 07/04/17; patient's left medial thigh cellulitis looks better. He has not been using his compression pumps as his insert said it was contraindicated with cellulitis. His right leg continues to make improvements all the wounds are still open. We only have one remaining wound on the left posterior calf. Using silver alginate to all open areas. He is on doxycycline which I started a week ago and should be finishing I gave him Augmentin after Thursday's visit for the severe cellulitis on the left medial thigh which fortunately looks better 07/14/17; the patient's left medial thigh cellulitis has resolved. The cellulitis in his right lower calf on the right also looks better. All of his wounds are stable to improved we've been using silver alginate he has completed the antibiotics I have given him. He has clindamycin 150 mg once a day prescribed by infectious disease for prophylaxis, I've advised him to start this now. We have been using bilateral Unna boots over silver alginate to the wound areas 07/21/17; the patient is been to see infectious disease who noted his recurrent problems with cellulitis. He was not able to tolerate prophylactic clindamycin therefore he is on amoxicillin 500 twice a day. He also had a second daily dose of Lasix added By Dr. Oneta Rack but he is not taking this. Nor is he being completely compliant with his compression pumps a especially not this week.  He has 2 remaining wounds one on the right posterior lateral lower leg and one on the left posterior medial lower leg. 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 The patient has Unna boots bilaterally. 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 He has bilateral Unna boots for compression and silver alginate to his wounds. Arrives today with his legs looking as good as  I have seen him in quite some time. Not surprisingly his wounds look better as well with improvement on the right lateral leg venous insufficiency wound and also the left medial leg. He is still using the compression pumps once a day 08/11/17; both legs appear to be doing better wounds on the right lateral and left medial legs look better. Skin on the right leg quite good. He is been using silver alginate as the primary dressing. I'm going to use Anasept gel calcium alginate and maintain all the secondary dressings 08/18/17; the patient continues to actually do quite well. The area on his right lateral leg is just about closed the left medial also looks better although it is still moist in this area. His edema is well controlled we have been using Anasept gel with calcium alginate and the usual secondary dressings, 4 layer compression and once daily use of his compression pumps "always been able to manage 09/01/17; the patient continues to do reasonably well in spite of his trip to [place]. The area on the right lateral leg is epithelialized. Left is much better but still open. He has more edema and more chronic erythema on the left leg [venous inflammation] 09/08/17; he arrives today with no open wound on the right lateral leg and decently controlled edema. Unfortunately his left leg is not nearly as in his  good situation as last week.he apparently had increasing edema starting on Saturday. He edema soaked through into his foot so used a plastic bag to walk around his home. The area on the medial right leg which was his open area is about the same however he has lost surface epithelium on the left lateral which is new and he has significant pain in the Achilles area of the left foot. He is already on amoxicillin chronically for prophylaxis of cellulitis in the left leg 09/15/17; he is completed a week of doxycycline and the cellulitis in the left posterior leg and Achilles area is as usual improved. He still has a lot of edema and fluid soaking through his dressings. There is no open wound on the right leg. He saw infectious disease NP today 09/22/17;As usual 1 we transition him from our compression wraps to his stockings things did not go well. He has several small open areas on the right leg. He states this was caused by the compression wrap on his skin although he did not wear this with the stockings over them. He has several superficial areas on the left leg medially laterally posteriorly. He does not have any evidence of active cellulitis especially involving the left Achilles The patient is traveling from [facility] Saturday going to [facility]. He states he isn't attempting to get an appointment with a heel objects wound center there to change his dressings. I am not completely certain whether this will work 10/06/17; the patient came in on Friday for a nurse visit and the nurse reported that his legs actually look quite good. He arrives in clinic today for his regular follow-up visit. He has a new wound on his left third toe over the PIP probably caused by friction with his footwear. He has small areas on the left leg and a very superficial but epithelialized area on the right anterior lateral lower leg. Other than that his legs look as good as I've seen him in quite some time. We have been using  silver alginate Review of systems; no chest pain no shortness of breath other than this a 10  point review of systems negative 10/20/17; seen by Dr. Meyer Russel last week. He had taken some antibiotics [doxycycline] that he had left over. Dr. Meyer Russel thought he had candida infection and declined to give him further antibiotics. He has a small wound remaining on the right lateral leg several areas on the left leg including a larger area on the left posterior several left medial and anterior and a small wound on the left lateral. The area on the left dorsal third toe looks a lot better. ROS; Gen.; no fever, respiratory no cough no sputum Cardiac no chest pain other than this 10 point review of system is negative 10/30/17; patient arrives today having fallen in the bathtub 3 days ago. It took him a while to get up. He has pain and maceration in the wounds on his left leg which have deteriorated. He has not been using his pumps he also has some maceration on the right lateral leg. 11/03/17; patient continues to have weeping edema especially in the left leg. This saturates his dressings which were just put on on 12/27. As usual the doxycycline seems to take care of the cellulitis on his lower leg. He is not complaining of fever, chills, or other systemic symptoms. He states his leg feels a lot better on the doxycycline I gave him empirically. He also apparently gets injections at his primary doctor's officeo Rocephin for cellulitis prophylaxis. I didn't ask him about his compression pump compliance today I think that's probably marginal. Arrives in the clinic with all of his dressings primary and secondary macerated full of fluid and he has bilateral edema 11/10/17; the patient's right leg looks some better although there is still a cluster of wounds on the right lateral. The left leg is inflamed with almost circumferential skin loss medially to laterally although we are still maintaining anteriorly. He does not  have overt cellulitis there is a lot of drainage. He is not using compression pumps. We have been using silver alginate to the wound areas, there are not a lot of options here 11/17/17; the patient's right leg continues to be stable although there is still open wounds, better than last week. The inflammation in the left leg is better. Still loss of surface layer epithelium especially posteriorly. There is no overt cellulitis in the amount of edema and his left leg is really quite good, tells me he is using his compression pumps once a day. 11/24/17; patient's right leg has a small superficial wound laterally this continues to improve. The inflammation in the left leg is still improving however we have continuous surface layer epithelial loss posteriorly. There is no overt cellulitis in the amount of edema in both legs is really quite good. He states he is using his compression pumps on the left leg once a day for 5 out of 7 days 12/01/17; very small superficial areas on the right lateral leg continue to improve. Edema control in both legs is better today. He has continued loss of surface epithelialization and left posterior calf although I think this is better. We have been using silver alginate with large number of absorptive secondary dressings 4 layer on the left Unna boot on the right at his request. He tells me he is using his compression pumps once a day 12/08/17; he has no open area on the right leg is edema control is good here. ooOn the left leg however he has marked erythema and tenderness breakdown of skin. He has what appears to be a wrap  injury just distal to the popliteal fossa. 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 There was swelling erythema and breakdown in the right leg. He has much worse skin breakdown in the left leg as well multiple open areas medially and posteriorly as well as laterally. He tells me he has been using his compression pumps but tells me he feels that the drainage out of his leg is worse when he uses a compression pumps. T be fair to him he is been saying this o for a while however I don't know that I have really been listening to this. I wonder if the compression pumps are working properly 12/22/17;. Once again he arrives with severe erythema, weeping edema from the left greater than right leg. Noncompliance with compression pumps. New this visit he is complaining of pain on the lateral aspect of the right leg and the medial aspect of his right thigh. He apparently saw his cardiologist Dr. [name] who was ordered an echocardiogram area and I think this is a step in the right direction 12/25/17; started his doxycycline Monday night. There is still intense erythema of the right leg especially in the anterior thigh although there is less tenderness. The erythema around the wound on the right lateral calf also is less tender. He still complaining of pain in the left heel. His wounds are about the same right lateral left medial left lateral. Superficial but certainly not close to closure. He denies being systemically unwell no fever chills no abdominal pain no diarrhea 12/29/17; back in follow-up of his extensive right calf and right thigh cellulitis. I added amoxicillin to cover possible doxycycline resistant strep. This seems to of done the trick he is in much less pain there is much less erythema and swelling. He has his echocardiogram at 11:00 this morning. X-ray of the left heel was also negative. 01/05/18; the patient arrived with his edema under much better control. Now that he is retired he is able to use his compression pumps daily and sometimes twice a day per the patient. He has a  wound on the right leg the lateral wound looks better. Area on the left leg also looks a lot better. He has no evidence of cellulitis in his bilateral thighs I had a quick peak at his echocardiogram. He is in normal ejection fraction and normal left ventricular function. He has moderate pulmonary hypertension moderately reduced right ventricular function. One would have to wonder about chronic sleep apnea although he says he doesn't snore. He'll review the echocardiogram with his cardiologist. 01/12/18; the patient arrives with the edema in both legs under exemplary control. He is using his compression pumps daily and sometimes twice daily. His wound on the right lateral leg is just about closed. He still has some weeping areas on the posterior left calf and lateral left calf although everything is just about closed here as well. I have spoken with Aldean Baker who is the patient's nurse practitioner and infectious disease. She was concerned that the patient had not understood that the parenteral penicillin injections he was receiving for cellulitis prophylaxis was actually benefiting him. I don't think the patient actually saw that I would tend to agree we were certainly dealing with  less infections although he had a serious one last month. 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 He is healed. He'll be placed in juxtalite compression wraps and increase his lymphedema pumps to twice daily. We will follow up again next week to ensure there are no issues with the new regiment. 01/20/18-he is here for evaluation of bilateral lower extremity weeping edema. Yesterday he was placed in compression wrap to the right lower extremity and compression stocking to left lower shrubbery. He states he uses lymphedema pumps last night and again this morning and noted a blister to the left lower extremity. On exam he was noted to have drainage to the right lower extremity. He  will be placed in Unna boots bilaterally and follow-up next week 01/26/18; patient was actually discharged a week ago to his own juxta light stockings only to return the next day with bilateral lower extremity weeping edema.he was placed in bilateral Unna boots. He arrives today with pain in the back of his left leg. There is no open area on the right leg however there is a linear/wrap injury on the left leg and weeping edema on the left leg posteriorly. I spoke with infectious disease about 10 days ago. They were disappointed that the patient elected to discontinue prophylactic intramuscular penicillin shots as they felt it was particularly beneficial in reducing the frequency of his cellulitis. I discussed this with the patient today. He does not share this view. He'll definitely need antibiotics today. Finally he is traveling to [place] and trauma leaving this Saturday and returning a week later and he does not travel with his pumps. He is going by car 01/30/18; patient was seen 4 days ago and brought back in today for review of cellulitis in the left leg posteriorly. I put him on amoxicillin this really hasn't helped as much as I might like. He is also worried because he is traveling to [facility] trauma by car. Finally we will be rewrapping him. There is no open area on the right leg over his left leg has multiple weeping areas as usual 02/09/18; The same wrap on for 10 days. He did not pick up the last doxycycline I prescribed for him. He apparently took 4 days worth he already had. There is nothing open on his right leg and the edema control is really quite good. He's had damage in the left leg medially and laterally especially probably related to the prolonged use of Unna boots 02/12/18; the patient arrived in clinic today for a nurse visit/wrap change. He complained of a lot of pain in the left posterior calf. He is taking doxycycline that I previously prescribed for him. Unfortunately even though  he used his stockings and apparently used to compression pumps twice a day he has weeping edema coming out of the lateral part of his right leg. This is coming from the lower anterior lateral skin area. 02/16/18; the patient has finished his doxycycline and will finish the amoxicillin 2 days. The area of cellulitis in the left calf posteriorly has resolved. He is no longer having any pain. He tells me he is using his compression pumps at least once a day sometimes twice. 02/23/18; the patient finished his doxycycline and Amoxil last week. On Friday he noticed a small erythematous circle about the size of a quarter on the left lower leg just above his ankle. This rapidly expanded and he now has erythema on the lateral and posterior part of the thigh.  This is bright red. Also has an area on the dorsal foot just above his toes and a tender area just below the left popliteal fossa. He came off his prophylactic penicillin injections at his own insistence one or 2 months ago. This is obviously deteriorated since then 03/02/18; patient is on doxycycline and Amoxil. Culture I did last week of the weeping area on the back of his left calf grew group B strep. I have therefore renewed the amoxicillin 500 3 times a day for a further week. He has not been systemically unwell. Still complaining of an area of discomfort right under his left popliteal fossa. There is no open wound on the right leg. He tells me that he is using his pumps twice a day on most days 03/09/18; patient arrives in clinic today completing his amoxicillin today. The cellulitis on his left leg is better. Furthermore he tells me that he had intramuscular penicillin shots that his primary care office today. However he also states that the wrap on his right leg fell down shortly after leaving clinic last week. He developed a large blister that was present when he came in for a nurse visit later in the week and then he developed intense discomfort around  this area.He tells me he is using his compression pumps 03/16/18; the patient has completed his doxycycline. The infectious part of this/cellulitis in the left heel area left popliteal area is a lot better. He has 2 open areas on the right calf. Still areas on the left calf but this is a lot better as well. 03/24/18; the patient arrives complaining of pain in the left popliteal area again. He thinks some of this is wrap injury. He has no open area on the right leg and really no open area on the left calf either except for the popliteal area. He claims to be compliant with the compression pumps 03/31/18; I gave him doxycycline last week because of cellulitis in the left popliteal area. This is a lot better although the surface epithelium is denuded off and response to this. He arrives today with uncontrolled edema in the right calf area as well as a fingernail injury in the right lateral calf. There is only a few open areas on the left 04/06/18; I gave him amoxicillin doxycycline over the last 2 weeks that the amoxicillin should be completing currently. He is not complaining of any pain or systemic symptoms. The only open areas see has is on the right lateral lower leg paradoxically I cannot see anything on the left lower leg. He tells me he is using his compression pumps twice a day on most days. Silver alginate to the wounds that are open under 4 layer compression 04/13/18; he completed antibiotics and has no new complaints. Using his compression pumps. Silver alginate that anything that's opened 04/20/18; he is using his compression pumps religiously. Silver alginate 4 layer compression anything that's opened. He comes in today with no open wounds on the left leg but 3 on the right including a new one posteriorly. He has 2 on the right lateral and one on the right posterior. He likes Unna boots on the right leg for reasons that aren't really clear we had the usual 4 layer compression on the left. It may be  necessary to move to the 4 layer compression on the right however for now I left them in the Unna boots 04/27/18; he is using his compression pumps at least once a day. He has still  the wounds on the right lateral calf. The area right posteriorly has closed. He does not have an open wound on the left under 4 layer compression however on the dorsal left foot just proximal to the toes and the left third toe 2 small open areas were identified 05/11/18; he has not uses compression pumps. The areas on the right lateral calf have coalesced into one large wound necrotic surface. On the left side he has one small wound anteriorly however the edema is now weeping out of a large part of his left leg. He says he wasn't using his pumps because of the weeping fluid. I explained to him that this is the time he needs to pump more 05/18/18; patient states he is using his compression pumps twice a day. The area on the right lateral large wound albeit superficial. On the left side he has innumerable number of small new wounds on the left calf particularly laterally but several anteriorly and medially. All these appear to have healthy granulated base these look like the remnants of blisters however they occurred under compression. The patient arrives in clinic today with his legs somewhat better. There is certainly less edema, less multiple open areas on the left calf and the right anterior leg looks somewhat better as well superficial and a little smaller. However he relates pain and erythema over the last 3-4 days in the thigh and I looked at this today. He has not been systemically unwell no fever no chills no change in blood sugar values 05/25/18; comes in today in a better state. The severe cellulitis on his left leg seems better with the Keflex. Not as tender. He has not been systemically unwell ooHard to find an open wound on the left lower leg using his compression pumps twice a day ooThe confluent wounds on his  right lateral calf somewhat better looking. These will ultimately need debridement I didn't do this today. 06/01/18; the severe cellulitis on the left anterior thigh has resolved and he is completed his Keflex. ooThere is no open wound on the left leg however there is a superficial excoriation at the base of the third toe dorsally. Skin on the bottom of his left foot is macerated looking. ooThe left the wounds on the lateral right leg actually looks some better although he did require debridement of the top half of this wound area with an open curet 06/09/18 on evaluation today patient appears to be doing poorly in regard to his right lower extremity in particular this appears to likely be infected he has very thick purulent discharge along with a bright green tent to the discharge. This makes me concerned about the possibility of pseudomonas. He's also having increased discomfort at this point on evaluation. Fortunately there does not appear to be any evidence of infection spreading to the other location at this time. 06/16/18 on evaluation today patient appears to actually be doing fairly well. His ulcer has actually diminished in size quite significantly at this point which is good news. Nonetheless he still does have some evidence of infection he did see infectious disease this morning before coming here for his appointment. I did review the results of their evaluation and their note today. They did actually have him discontinue the Cipro and initiate treatment with linezolid at this time. He is doing this for the next seven days and they recommended a follow-up in four months with them. He is the keep a log of the need for intermittent antibiotic therapy  between now and when he falls back up with infectious disease. This will help them gaze what exactly they need to do to try and help them out. 06/23/18; the patient arrives today with no open wounds on the left leg and left third toe healed. He is  been using his compression pumps twice a day. On the right lateral leg he still has a sizable wound but this is a lot better than last time I saw this. 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 Has been using silver alginate under 4 layer compression 06/30/18; the only open wound he has is on the right lateral leg and this looks healthy. No debridement is required. We have been using silver alginate. He does not have an open wound on the left leg. There is apparently some drainage from the dorsal proximal third toe on the left although I see no open wound here. 07/03/18 on evaluation today patient was actually here just for a nurse visit rapid change. However when he was here on Wednesday for his rat change due to having been healed on the left and then developing blisters we initiated the wrap again knowing that he would be back today for [place] to reevaluate and see were at. Unfortunately he has developed some cellulitis into the proximal portion of his right lower extremity even into the region of his thigh. 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 He was placed on one as what at that point. Nonetheless he is done with that and has been tolerating it well otherwise. Doxycycline which in the past really did not seem to be effective for him. Nonetheless I think the best option may be for [place] to definitely reinitiate the antibiotics for a longer period of time. 07/07/18; since I last saw this patient a week ago he has had a difficult time. At that point he did not have an open wound on his left leg. We transitioned him into juxta light stockings. He was apparently in the clinic the next day with blisters on the left lateral and left medial lower calf. He also had weeping edema fluid. He was put back into a compression wrap. He was also in the clinic on Friday with  intense erythema in his right thigh. Per the patient he was started on Bactrim however that didn't work at all in terms of relieving his pain and swelling. He has taken 3 doxycycline that he had left over from last time and that seems to of helped. He has blistering on the right thigh as well. 07/14/18; the erythema on his right thigh has gotten better with doxycycline that he is finishing. 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 Presumably this cellulitis in the thigh was not related to that although I think this is a bit concerning going forward. He still has an area on the right lateral calf the blister on the right medial calf just below the knee that was discussed above. On the left 2 small open areas left medial and left lateral. Edema control is adequate. He is using his compression pumps twice a day 07/20/18; continued improvement in the condition of both legs especially the edema in his bilateral thighs. He tells me he is been losing weight through a combination  of diet and exercise. He is using his compression pumps twice a day. So overall she made to the remaining wounds 07/27/2018; continued improvement in condition of both legs. His edema is well controlled. The area on the right lateral leg is just about closed he had one blisters show up on the medial left upper calf. We have him in 4 layer compression. He is going on a 10-day trip to [place], [place] and [place]. He will be driving. He wants to wear Unna boots because of the lessening amount of constriction. He will not use compression pumps while he is away 08/05/18 on evaluation today patient actually appears to be doing decently well all things considered in regard to his bilateral lower extremities. The worst ulcer is actually only posterior aspect of his left lower extremity with a four layer compression wrap cut into his leg a couple weeks back. He  did have a trip and actually had [employer] for the trip that he is worn since he was last here. Nonetheless he feels like the [employer] actually do better for him his swelling is up a little bit but he also with his trip was not taking his Lasix on a regular set schedule like he was supposed to be. He states that obviously the reason being that he cannot drive and keep going without having to urinate too frequently which makes it difficult. He did not have his pumps with him while he was away either which I think also maybe playing a role here too. 08/13/2018; the patient only has a small open wound on the right lateral calf which is a big improvement in the last month or 2. He also has the area posteriorly just below the posterior fossa on the left which I think was a wrap injury from several weeks ago. He has no current evidence of cellulitis. He tells me he is back into his compression pumps twice a day. He also tells me that while he was at the laundromat somebody stole a section of his extremitease stockings 08/20/2018; back in the clinic with a much improved state. He only has small areas on the right lateral mid calf which is just about healed. This was is more substantial area for quite a prolonged period of time. He has a small open area on the left anterior tibia. The area on the posterior calf just below the popliteal fossa is closed today. He is using his compression pumps twice a day 08/28/2018; patient has no open wound on the right leg. He has a smattering of open areas on the calf with some weeping lymphedema. More problematically than that it looks as though his wraps of slipped down in his usual he has very angry upper area of edema just below the right medial knee and on the right lateral calf. He has no open area on his feet. The patient is traveling to [facility] next week. I will send him in an antibiotic. We will continue to wrap the right leg. We ordered  extremitease stockings for him last week and I plan to transition the right leg to a stocking when he gets home which will be in 10 days time. As usual he is very reluctant to take his pumps with him when he travels 09/07/2018; patient returns from [facility]. He shows me a picture of his left leg in the mid part of his trip last week with intense fire engine erythema. The picture look bad  enough I would have considered sending him to the hospital. Instead he went to the wound care center in [facility]. They did not prescribe him antibiotics but he did take some doxycycline he had leftover from a previous visit. I had given him trimethoprim sulfamethoxazole before he left this did not work according to the patient. This is resulted in some improvement fortunately. He comes back with a large wound on the left posterior calf. Smaller area on the left anterior tibia. Denuded blisters on the dorsal left foot over his toes. Does not have much in the way of wounds on the right leg although he does have a very tender area on the right posterior area just below the popliteal fossa also suggestive of infection. He promises me he is back on his pumps twice a day 09/15/2018; the intense cellulitis in his left lower calf is a lot better. The wound area on the posterior left calf is also so better. However he has reasonably extensive wounds on the dorsal aspect of his second and third toes and the proximal foot just at the base of the toes. There is nothing open on the right leg 09/22/2018; the patient has excellent edema control in his legs bilaterally. He is using his external compression pumps twice a day. He has no open area on the right leg and only the areas in the left foot dorsally second and third toe area on the left side. He does not have any signs of active cellulitis. 10/06/2018; the patient has good edema control bilaterally. He has no open wound on the right leg. There is a blister in the  posterior aspect of his left calf that we had to deal with today. He is using his compression pumps twice a day. There is no signs of active cellulitis. We have been using silver alginate to the wound areas. He still has vulnerable areas on the base of his left first second toes dorsally He has a his extremities stockings and we are going to transition him today into the stocking on the right leg. He is cautioned that he will need to continue to use the compression pumps twice a day. If he notices uncontrolled edema in the right leg he may need to go to 3 times a day. 10/13/2018; the patient came in for a nurse check on Friday he has a large flaccid blister on the right medial calf just below the knee. We unroofed this. He has this and a new area underneath the posterior mid calf which was undoubtedly a blister as well. He also has several small areas on the right which is the area we put his extremities stocking on. 10/19/2018; the patient went to see infectious disease this morning I am not sure if that was a routine follow-up in any case the doxycycline I had given him was discontinued and started on linezolid. He has not started this. It is easy to look at his left calf and the inflammation and think this is cellulitis however he is very tender in the tissue just below the popliteal fossa and I have no doubt that there is infection going on here. He states the problem he is having is that with the compression pumps the edema goes down and then starts walking the wrap falls down. We will see if we can adhere this. He has 1 or 2 minuscule open areas on the right still areas that are weeping on the posterior left calf, the base of his  left second and third toes 10/26/18; back today in clinic with quite of skin breakdown in his left anterior leg. This may have been infection the area below the popliteal fossa seems a lot better however tremendous epithelial loss on the left anterior mid tibia area over  quite inexpensive tissue. He has 2 blisters on the right side but no other open wound here. 10/29/2018; came in urgently to see [place] today and we worked him in for review. He states that the 4 layer compression on the right leg caused pain he had to cut it down to roughly his mid calf this caused swelling above the wrap and he has blisters and skin breakdown today. As a result of the pain he has not been using his pumps. Both legs are a lot more edematous and there is a lot of weeping fluid. 11/02/18; arrives in clinic with continued difficulties in the right leg> left. Leg is swollen and painful. multiple skin blisters and new open areas especially laterally. He has not been using his pumps on the right leg. 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 He is not systemically unwell. 11/09/2018; the patient claims he is being compliant with his pumps. He is finished the doxycycline I gave him last week. Culture I did of the wound on the right lateral leg showed a few very resistant methicillin staph aureus. This was resistant to doxycycline. Nevertheless he states the pain in the leg is a lot better which makes me wonder if the cultured organism was not really what was causing the problem nevertheless this is a very dangerous organism to be culturing out of any wound. His right leg is still a lot larger than the left. He is using an Radio broadcast assistant on this area, he blames a 4-layer compression for causing the original skin breakdown which I doubt is true however I cannot talk him out of it. We have been using silver alginate to all of these areas which were initially blisters 11/16/2018; patient is being compliant with his external compression pumps at twice a day. Miraculously he arrives in clinic today with absolutely no open wounds. He has better edema control on the left where he has been using 4 layer compression versus wound of wounds on the right and I pointed  this out to him. There is no inflammation in the skin in his lower legs which is also somewhat unusual for him. There is no open wounds on the dorsal left foot. He has extremitease stockings at home and I have asked him to bring these in next week. 11/25/18 patient's lower extremity on examination today on the left appears for the most part to be wound free. He does have an open wound on the lateral aspect of the right lower extremity but this is minimal compared to what I've seen in past. He does request that we go ahead and wrap the left leg as well even though there's nothing open just so hopefully it will not reopen in short order. 1/28; patient has superficial open wounds on the right lateral calf left anterior calf and left posterior calf. His edema control is adequate. 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 We have been using silver alginate as the primary dressing. He claims compliance with his compression pumps 2/4; patient has superficial open wounds on numerous areas of his left calf and again  one on the left dorsal foot. The areas on the right lateral calf have healed. The cellulitis that I gave him doxycycline for last week is also resolved this was mostly on the left anterior calf just below the tibial tuberosity. His edema looks fairly well-controlled. He tells me he went to see his primary doctor today and had blood work ordered 2/11; once again he has several open areas on the left calf left tibial area. Most of these are small and appear to have healthy granulation. He does not have anything open on the right. The edema and control in his thighs is pretty good which is usually a good indication he has been using his pumps as requested. 2/18; he continues to have several small areas on the left calf and left tibial area. Most of these are small healthy granulation. We put him in his stocking  on the right leg last week and he arrives with a superficial open area over the right upper tibia and a fairly large area on the right lateral tibia in similar condition. His edema control actually does not look too bad, he claims to be using his compression pumps twice a day 2/25. Continued small areas on the left calf and left tibial area. New areas especially on the right are identified just below the tibial tuberosity and on the right upper tibia itself. There are also areas of weeping edema fluid even without an obvious wound. He does not have a considerable degree of lymphedema but clearly there is more edema here than his skin can handle. He states he is using the pumps twice a day. We have an Unna boot on the right and 4 layer compression on the left. 3/3; he continues to have an area on the right lateral calf and right posterior calf just below the popliteal fossa. There is a fair amount of tenderness around the wound on the popliteal fossa but I did not see any evidence of cellulitis, could just be that the wrap came down and rubbed in this area. ooHe does not have an open area on the left leg however there is an area on the left dorsal foot at the base of the third toe ooWe have been using silver alginate to all wound areas 3/10; he did not have an open area on his left leg last time he was here a week ago. T oday he arrives with a horizontal wound just below the tibial tuberosity and an area on the left lateral calf. He has intense erythema and tenderness in this area. The area is on the right lateral calf and right posterior calf better than last week. We have been using silver alginate as usual 3/18 - Patient returns with 3 small open areas on left calf, and 1 small open area on right calf, the skin looks ok with no significant erythema, he continues the UNA boot on right and 4 layer compression on left. The right lateral calf wound is closed , the right posterior is small area. we  will continue silver alginate to the areas. 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 He is been tolerating the dressing changes without complication. Fortunately she has made excellent improvement in regard to the overall status of his wounds. Unfortunately every time we cease wrapping him he ends up  reopening in causing more significant issues at that point. Again I'm unsure of the best direction to take although I think the lymphedema clinic may be appropriate for him. 02/03/19 on evaluation today patient appears to be doing well in regard to the wounds that we saw him for last week unfortunately he has a new area on the proximal portion of his right medial/posterior lower extremity where the wrap somewhat slowed down and caused swelling and a blister to rub and open. Unfortunately this is the only opening that he has on either leg at this point. 02/17/19 on evaluation today patient's bilateral lower extremities appear to be doing well. He still completely healed in regard to the left lower extremity. In regard to the right lower extremity the area where the wrap and slid down and caused the blister still seems to be slightly open although this is dramatically better than during the last evaluation two weeks ago. I'm very pleased with the way this stands overall. 03/03/19 on evaluation today patient appears to be doing well in regard to his right lower extremity in general although he did have a new blister open this does not appear to be showing any evidence of active infection at this time. Fortunately there's No fevers, chills, nausea, or vomiting noted at this time. Overall I feel like he is making good progress it does feel like that the right leg will we perform the [employer] seems to do with a bit  better than three layer wrap on the left which slid down on him. We may switch to doing bilateral in the book wraps. 5/4; I have not seen Mr. Mascari in quite some time. According to our case manager he did not have an open wound on his left leg last week. He had 1 remaining wound on the right posterior medial calf. He arrives today with multiple openings on the left leg probably were blisters and/or wrap injuries from Unna boots. I do not think the Unna boot's will provide adequate compression on the left. I am also not clear about the frequency he is using the compression pumps. 03/17/19 on evaluation today patient appears to be doing excellent in regard to his lower extremities compared to last week's evaluation apparently. He had gotten significantly worse last week which is unfortunate. The [employer] wrap on the left did not seem to do very well for him at all and in fact it didn't control his swelling significantly enough he had an additional outbreak. Subsequently we go back to the four layer compression wrap on the left. This is good news. At least in that he is doing better and the wound seem to be killing him. He still has not heard anything from the lymphedema clinic. 03/24/19 on evaluation today patient actually appears to be doing much better in regard to his bilateral lower Trinity as compared to last week when I saw him. Fortunately there's no signs of active infection at this time. He has been tolerating the dressing changes without complication. Overall I'm extremely pleased with the progress and appearance in general. 04/07/19 on evaluation today patient appears to be doing well in regard to his bilateral lower extremities. His swelling is significantly down from where it was previous. With that being said he does have a couple blisters still open at this point but fortunately nothing that seems to be too severe and again the majority of the larger openings has healed at this  time. 04/14/19 on evaluation today patient actually  appears to be doing quite well in regard to his bilateral lower extremities in fact I'm not even sure there's anything significantly open at this time at any site. Nonetheless he did have some trouble with these wraps where they are somewhat irritating him secondary to the fact that he has noted that the graph wasn't too close down to the end of this foot in a little bit short as well up to his knee. Otherwise things seem to be doing quite well. 04/21/19 upon evaluation today patient's wound bed actually showed evidence of being completely healed in regard to both lower extremities which is excellent news. There does not appear to be any signs of active infection which is also good news. I'm very pleased in this regard. No fevers, chills, nausea, or vomiting noted at this time. 04/28/19 on evaluation today patient appears to be doing a little bit worse in regard to both lower extremities on the left mainly due to the fact that when he went infection disease the wrap was not wrapped quite high enough he developed a blister above this. On the right he is a small open area of nothing too significant but again this is continuing to give him some trouble he has been were in the Velcro compression that he has at home. 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 He's been tolerating the dressing changes without complication. Fortunately there's no signs of active infection at this time. No fevers, chills, nausea, or vomiting noted at this time. We have been trying to get an appointment with her lymphedema clinic in [facility] but unfortunately nobody can get them on phone with not been able to even fax information over the patient likewise is not been able to get in touch with them. Overall I'm not sure exactly what's going on here with to reach out again today. 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 Still having a lot of drainage unfortunately. He tells me especially in the left but even on the right. There's no signs of active infection which is good news we've been using so ratcheted up to this point. 05/19/19 on evaluation today patient actually appears to be doing quite well with regard to his left lower extremity which is great news. Fortunately in regard to the right lower extremity has an issues with his wrap and he subsequently did remove this from what I'm understanding. Nonetheless long story short is what he had rewrapped once he removed it subsequently had maggots underneath this wrap whenever he came in for evaluation today. With that being said they were obviously completely cleaned away by the nursing staff. The visit today which is excellent news. However he does appear to potentially have some infection around the right ankle region where the maggots were located as well. He will likely require anabiotic therapy today. 05/26/19 on evaluation today patient actually appears to be doing much better in regard to his bilateral lower extremities. I feel like the infection is under much better control. With that being said there were maggots noted when the wrap was removed yet again today. Again this could have potentially been left over from previous although at this time there does not appear to be any signs of significant drainage there was obviously on the wrap some drainage as well this contracted gnats or otherwise. Either way I do  not see anything that appears to be doing worse in my pinion and in fact I think his drainage has slowed down quite significantly likely mainly due to the fact to his infection being under better control. 06/02/2019 on evaluation today patient actually appears to be doing well with regard to his bilateral lower extremities there is no signs of active  infection at this time which is great news. With that being said he does have several open areas more so on the right than the left but nonetheless these are all significantly better than previously noted. 06/09/2019 on evaluation today patient actually appears to be doing well. His wrap stayed up and he did not cause any problems he had more drainage on the right compared to the left but overall I do not see any major issues at this time which is great news. 06/16/2019 on evaluation today patient appears to be doing excellent with regard to his lower extremities the only area that is open is a new blister that can have opened as of today on the medial ankle on the left. Other than this he really seems to be doing great I see no major issues at this point. 06/23/2019 on evaluation today patient appears to be doing quite well with regard to his bilateral lower extremities. In fact he actually appears to be almost completely healed there is a small area of weeping noted of the right lower extremity just above the ankle. Nonetheless fortunately there is no signs of active infection at this time which is good news. No fevers, chills, nausea, vomiting, or diarrhea. 8/24; the patient arrived for a nurse visit today but complained of very significant pain in the left leg and therefore I was asked to look at this. Noted that he did not have an open area on the left leg last week nevertheless this was wrapped. The patient states that he is not been able to put his compression pumps on the left leg because of the discomfort. He has not been systemically unwell 06/30/2019 on evaluation today patient unfortunately despite being excellent last week is doing much worse with regard to his left lower extremity today. In fact he had to come in for a nurse on Monday where his left leg had to be rewrapped due to excessive weeping Dr. Leanord Hawking placed him on doxycycline at that point. Fortunately there is no signs of active  infection Systemically at this time which is good news. 07/07/2019 in regard to the patient's wounds today he actually seems to be doing well with his right lower extremity there really is nothing open or draining at this point this is great news. Unfortunately the left lower extremity is given him additional trouble at this time. There does not appear to be any signs of active infection nonetheless he does have a lot of edema and swelling noted at this point as well as blistering all of which has led to a much more poor appearing leg at this time compared to where it was 2 weeks ago when it was almost completely healed. Obviously this is a little discouraging for the patient. He is try to contact the lymphedema clinic in [place] he has not been able to get through to them. 07/14/2019 on evaluation today patient actually appears to be doing slightly better with regard to his left lower extremity ulcers. Overall I do feel like at least at the top of the wrap that we have been placing this area has healed quite nicely and  looks much better. The remainder of the leg is showing signs of improvement. Unfortunately in the thigh area he still has an open region on the left and again on the right he has been utilizing just a Band-Aid on an area that also opened on the thigh. Again this is an area that were not able to wrap although we did do an Ace wrap to provide some compression that something that obviously is a little less effective than the compression wraps we have been using on the lower portion of the leg. He does have an appointment with the lymphedema clinic in [facility] on Friday. 07/21/2019 on evaluation today patient appears to be doing better with regard to his lower extremity ulcers. He has been tolerating the dressing changes without complication. Fortunately there is no signs of active infection at this time. No fevers, chills, nausea, vomiting, or diarrhea. I did receive the paperwork  from the physical therapist at the lymphedema clinic in [place]. Subsequently I signed off on that this morning and sent that back to him for further progression with the treatment plan. 07/28/2019 on evaluation today patient appears to be doing very well with regard to his right lower extremity where I do not see any open wounds at this point. Fortunately he is feeling great as far as that is concerned as well. In regard to the left lower extremity he has been having issues with still several areas of weeping and edema although the upper leg is doing better his lower leg still I think is going require the compression wrap at this time. No fevers, chills, nausea, vomiting, or diarrhea. 08/04/2019 on evaluation today patient unfortunately is having new wounds on the right lower extremity. Again we have been using Unna boot wrap on that side. We switched him to using his juxta lite wrap at home. With that being said he tells me he has been using it although his legs extremely swollen and to be honest really does not appear that he has been. I cannot know that for sure however. Nonetheless he has multiple new wounds on the right lower extremity at this time. Obviously we will have to see about getting this rewrapped for him today. 08/11/2019 on evaluation today patient appears to be doing fairly well with regard to his wounds. He has been tolerating the dressing changes including the compression wraps without complication. He still has a lot of edema in his upper thigh regions bilaterally he is supposed to be seeing the lymphedema clinic on the 15th of this month once his wraps arrive for the upper part of his legs. 08/18/2019 on evaluation today patient appears to be doing well with regard to his bilateral lower extremities at this point. He has been tolerating the dressing changes without complication. Fortunately there is no signs of active infection which is also good news. He does have a couple  weeping areas on the first and second toe of the right foot he also has just a small area on the left foot upper leg and a small area on the left lower leg but overall he is doing quite well in my opinion. He is supposed to be getting his wraps shortly in fact tomorrow and then subsequently is seeing the lymphedema clinic next Wednesday on the 21st. Of note he is also leaving on the 25th to go on vacation for a week to the beach. For that reason and since there is some uncertainty about what there can be doing at lymphedema  clinic next Wednesday I am get a make an appointment for next Friday here for [place] to see what we need to do for him prior to him leaving for vacation. 10/23; patient arrives in considerable pain predominantly in the upper posterior calf just distal to the popliteal fossa also in the wound anteriorly above the major wound. This is probably cellulitis and he has had this recurrently in the past. He has no open wound on the right side and he has had an Radio broadcast assistant in that area. Finally I note that he has an area on the left posterior calf which by enlarge is mostly epithelialized. This protrudes beyond the borders of the surrounding skin in the setting of dry scaly skin and lymphedema. The patient is leaving for [facility] on Sunday. Per his longstanding pattern, he will not take his compression pumps with him predominantly out of fear that they will be stolen. He therefore asked that we put a Unna boot back on the right leg. He will also contact the wound care center in [facility] to see if they can change his dressing in the mid week. 11/3; patient returned from his vacation to [facility]. He was seen on 1 occasion at their wound care center. They did a 2 layer compression system as they did not have our 4-layer wrap. I am not completely certain what they put on the wounds. They did not change the Unna boot on the right. The patient is also seeing a lymphedema specialist physical  therapist in [place]. It appears that he has some compression sleeve for his thighs which indeed look quite a bit better than I am used to seeing. He pumps over these with his external compression pumps. 11/10; the patient has a new wound on the right medial thigh otherwise there is no open areas on the right. He has an area on the left leg posteriorly anteriorly and medially and an area over the left second toe. We have been using silver alginate. He thinks the injury on his thigh is secondary to friction from the compression sleeve he has. 11/17; the patient has a new wound on the right medial thigh last week. He thinks this is because he did not have a underlying stocking for his thigh juxta lite apparatus. He now has this. The area is fairly large and somewhat angry but I do not think he has underlying cellulitis. ooHe has a intact blister on the right anterior tibial area. ooSmall wound on the right great toe dorsally ooSmall area on the medial left calf. 11/30; the patient does not have any open areas on his right leg and we did not take his juxta lite stocking off. However he states that on Friday his compression wrap fell down lodging around his upper mid calf area. As usual this creates a lot of problems for him. He called urgently today to be seen for a nurse visit however the nurse visit turned into a provider visit because of extreme erythema and pain in the left anterior tibia extending laterally and posteriorly. The area that is problematic is extensive 10/06/2019 upon evaluation today patient actually appears to be doing poorly in regard to his left lower extremity. He Dr. Leanord Hawking did place him on doxycycline this past Monday apparently due to the fact that he was doing much worse in regard to this left leg. Fortunately the doxycycline does seem to be helping. Unfortunately we are still having a very difficult time getting his edema under  any type of control in order to  anticipate discharge at some point. The only way were really able to control his lymphedema really is with compression wraps and that has only even seemingly temporary. He has been seeing a lymphedema clinic they are trying to help in this regard but still this has been somewhat frustrating in general for the patient. 10/13/19 on evaluation today patient appears to be doing excellent with regard to his right lower extremity as far as the wounds are concerned. His swelling is still quite extensive unfortunately. He is still having a lot of drainage from the thigh areas bilaterally which is unfortunate. He's been going to lymphedema clinic but again he still really does not have this edema under control as far as his lower extremities are concern. With regard to his left lower extremity this seems to be improving and I do believe the doxycycline has been of benefit for him. He is about to complete the doxycycline. 10/20/2019 on evaluation today patient appears to be doing poorly in regard to his bilateral lower extremities. More in the right thigh he has a lot of irritation at this site unfortunately. In regard to the left lower extremity the wrap was not quite as high it appears and does seem to have caused him some trouble as well. Fortunately there is no evidence of systemic infection though he does have some blue-green drainage which has me concerned for the possibility of Pseudomonas. He tells me he is previously taking Cipro without complications and he really does not care for Levaquin however due to some of the side effects he has. He is not allergic to any medications specifically antibiotics that were aware of. 10/27/2019 on evaluation today patient actually does appear to be for the most part doing better when compared to last week's evaluation. With that being said he still has multiple open wounds over the bilateral lower extremities. He actually forgot to start taking the Cipro and states  that he still has the whole bottle. He does have several new blisters on left lower extremity today I think I would recommend he go ahead and take the Cipro based on what I am seeing at this point. 12/30-Patient comes at 1 week visit, 4 layer compression wraps on the left and Unna boot on the right, primary dressing Xtrasorb and silver alginate. Patient is taking his Cipro and has a few more days left probably 5-6, and the legs are doing better. He states he is using his compressions devices which I believe he has 11/10/2019 on evaluation today patient actually appears to be much better than last time I saw him 2 weeks ago. His wounds are significantly improved and overall I am very pleased in this regard. Fortunately there is no signs of active infection at this time. He is just a couple of days away from completing Cipro. Overall his edema is much better he has been using his lymphedema pumps which I think is also helping at this point. 11/17/2019 on evaluation today patient appears to be doing excellent in regard to his wounds in general. His legs are swollen but not nearly as much as they have been in the past. Fortunately he is tolerating the compression wraps without complication. No fevers, chills, nausea, vomiting, or diarrhea. He does have some erythema however in the distal portion of his right lower extremity specifically around the forefoot and toes there is a little bit of warmth here as well. 11/24/2019 on evaluation today patient appears to  be doing well with regard to his right lower extremity I really do not see any open wounds at this point. His left lower extremity does have several open areas and his right medial thigh also is open. Other than this however overall the patient seems to be making good progress and I am very pleased at this point. 12/01/2019 on evaluation today patient appears to be doing poorly at this point in regard to his left lower extremity has several new blisters  despite the fact that we have him in compression wraps. In fact he had a 4-layer compression wrap, his upper thigh wrapped from lymphedema clinic, and a juxta light over top of the 4 layer compression wrap the lymphedema clinic applied and despite all this he still develop blisters underneath. Obviously this does have me concerned about the fact that unfortunately despite what we are doing to try to get wounds healed he continues to have new areas arise I do not think he is ever good to be at the point where he can realistically just use wraps at home to keep things under control. Typically when we heal him it takes about 1-2 days before he is back in the clinic with severe breakdown and blistering of his lower extremities bilaterally. This is happened numerous times in the past. Unfortunately I think that we may need some help as far as overall fluid overload to kind of limit what we are seeing and get things under better control. 12/08/2019 on evaluation today patient presents for follow-up concerning his ongoing bilateral lower extremity edema. Unfortunately he is still having quite a bit of swelling the compression wraps are controlling this to some degree but he did see Dr. [name] his cardiologist I do have that available for review today as far as the appointment was concerned that was on 12/06/2019. Obviously that she has been 2 days ago. The patient states that he is only been taking the Lasix 80 mg 1 time a day he had told me previously he was taking this twice a day. Nonetheless Dr. [name] recommended this be up to 80 mg 2 times a day for the patient as he did appear to be fluid overloaded. With that being said the patient states he did this yesterday and he was unable to go anywhere or do anything due to the fact that he was constantly having to urinate. Nonetheless I think that this is still good to be something that is important for him as far as trying to get his edema under control at all  things that he is going to be able to just expect his wounds to get under control and things to be better without going through at least a period of time where he is trying to stabilize his fluid management in general and I think increasing the Lasix is likely the first step here. It was also mentioned the possibility that the patient may require metolazone. With that being said he wanted to have the patient take Lasix twice a day first and then reevaluating 2 months to see where things stand. 12/15/2019 upon evaluation today patient appears to be doing regard to his legs although his toes are showing some signs of weeping especially on the left at this point to some degree on the right. There does not appear to be any signs of active infection and overall I do feel like the compression wraps are doing well for him but he has not been able to take the Lasix  at home and the increased dose that Dr. [name] recommended. He tells me that just not go to be feasible for him. Nonetheless I think in this case he should probably send a message to Dr. [name] in order to discuss options from the standpoint of possible admission to get the fluid off or otherwise going forward. 12/22/2019 upon evaluation today patient appears to be doing fairly well with regard to his lower extremities at this point. In fact he would be doing excellent if it was not for the fact that his right anterior thigh apparently had an allergic reaction to adhesive tape that he used. The wound itself that we have been monitoring actually appears to be healed. There is a lot of irritation at this point. 12/29/2019 upon evaluation today patient appears to be doing well in regard to his lower extremities. His left medial thigh is open and somewhat draining today but this is the only region that is open the right has done much better with the treatment utilizing the steroid cream that I prescribed for him last week. Overall I am pleased in that  regard. Fortunately there is no signs of active infection at this time. No fevers, chills, nausea, vomiting, or diarrhea. 01/05/2020 upon evaluation today patient appears to be doing more poorly in regard to his right lower extremity at this point upon evaluation today. Unfortunately he continues to have issues in this regard and I think the biggest issue is controlling his edema. This obviously is not very well controlled at this point is been recommended that he use the Lasix twice a day but he has not been able to do that. Unfortunately I think this is leading to an issue where honestly he is not really able to effectively control his edema and therefore the wounds really are not doing significantly better. I do not think that he is going to be able to keep things under good control unless he is able to control his edema much better. I discussed this again in great detail with him today. 01/12/2020 good news is patient actually appears to be doing quite well today at this point. He does have an appointment with lymphedema clinic tomorrow. His legs appear healed and the toe on the left is almost completely healed. In general I am very pleased with how things stand at this point. 01/19/2020 upon evaluation today patient appears to actually be doing well in regard to his lower extremities there is nothing open at this point. Fortunately he has done extremely well more recently. Has been seeing lymphedema clinic as well. With that being said he has Velcro wraps for his lower legs as well as his upper legs. The only wound really is on his toe which is the right great toe and this is barely anything even there. With all that being said I think it is good to be appropriate today to go ahead and switch him over to the Velcro compression wraps. 01/26/2020 upon evaluation today patient appears to be doing worse with regard to his lower extremities after last week switch him to Velcro compression  wraps. Unfortunately he lasted less than 24 hours he did not have the sock portion of his Velcro wrap on the left leg and subsequently developed a blister underneath the Velcro portion. Obviously this is not good and not what we were looking for at this point. He states the lymphedema clinic did tell him to wear the wrap for 23 hours and take him off for 1  I am okay with that plan but again right now we got a get things back under control again he may have some cellulitis noted as well. 02/02/2020 upon evaluation today patient unfortunately appears to have several areas of blistering on his bilateral lower extremities today mainly on the feet. His legs do seem to be doing somewhat better which is good news. Fortunately there is no evidence of active infection at this time. No fevers, chills, nausea, vomiting, or diarrhea. 02/16/2020 upon evaluation today patient appears to be doing well at this time with regard to his legs. He has a couple weeping areas on his toes but for the most part everything is doing better and does appear to be sealed up on his legs which is excellent news. We can continue with wrapping him at this point as he had every time we discontinue the wraps he just breaks out with new wounds. There is really no point in is going forward with this at this point. 03/08/2020 upon evaluation today patient actually appears to be doing quite well with regard to his lower extremity ulcers. He has just a very superficial and really almost nonexistent blister on the left lower extremity he has in general done very well with the compression wraps. With that being said I do not see any signs of infection at this time which is good news. 03/29/2020 upon evaluation today patient appears to be doing well with regard to his wounds currently except for where he had several new areas that opened up due to some of the wrap slipping and causing him trouble. He states he did not realize they had slipped.  Nonetheless he has a 1 area on the right and 3 new areas on the left. Fortunately there is no signs of active infection at this time which is great news. 04/05/2020 upon evaluation today patient actually appears to be doing quite well in general in regard to his legs currently. Fortunately there is no signs of active infection at this time. No fevers, chills, nausea, vomiting, or diarrhea. He tells me next week that he will actually be seen in the lymphedema clinic on Thursday at 10 AM I see him on Wednesday next week. 04/12/2020 upon evaluation today patient appears to be doing very well with regard to his lower extremities bilaterally. In fact he does not appear to have any open wounds at this point which is good news. Fortunately there is no signs of active infection at this time. No fevers, chills, nausea, vomiting, or diarrhea. 04/19/2020 upon evaluation today patient appears to be doing well with regard to his wounds currently on the bilateral lower extremities. There does not appear to be any signs of active infection at this time. Fortunately there is no evidence of systemic infection and overall very pleased at this point. Nonetheless after I held him out last week he literally had blisters the next morning already which swelled up with him being right back here in the clinic. Overall I think that he is just not can be able to be discharged with his legs the way they are he is much to volume overloaded as far as fluid is concerned and that was discussed with him today of also discussed this but should try the clinic nurse manager as well as Dr. Leanord Hawking. 04/26/2020 upon evaluation today patient appears to be doing better with regard to his wounds currently. He is making some progress and overall swelling is under good control with the compression wraps. Fortunately  there is no evidence of active infection at this time. 05/10/2020 on evaluation today patient appears to be doing overall well in regard to  his lower extremities bilaterally. He is Tolerating the compression wraps without complication and with what we are seeing currently I feel like that he is making excellent progress. There is no signs of active infection at this time. 05/24/2020 upon evaluation today patient appears to be doing well in regard to his legs. The swelling is actually quite a bit down compared to where it has been in the past. Fortunately there is no sign of active infection at this time which is also good news. With that being said he does have several wounds on his toes that have opened up at this point. 05/31/2020 upon evaluation today patient appears to be doing well with regard to his legs bilaterally where he really has no significant fluid buildup at this point overall he seems to be doing quite well. Very pleased in this regard. With regard to his toes these also seem to be drying up which is excellent. We have continue to wrap him as every time we tried as a transition to the juxta light wraps things just do not seem to get any better. 06/07/2020 upon evaluation today patient appears to be doing well with regard to his right leg at this point. Unfortunately left leg has a lot of blistering he tells me the wrap started to slide down on him when he tried to put his other Velcro wrap over top of it to help keep things in order but nonetheless still had some issues. 06/14/2020 on evaluation today patient appears to be doing well with regard to his lower extremity ulcers and foot ulcers at this point. I feel like everything is actually showing signs of improvement which is great news overall there is no signs of active infection at this time. No fevers, chills, nausea, vomiting, or diarrhea. 06/21/2020 on evaluation today patient actually appears to be doing okay in regard to his wounds in general. With that being said the biggest issue I see is on his right foot in particular the first and second toe seem to be doing a  little worse due to the fact this is staying very wet. I think he is probably getting need to change out his dressings a couple times in between each week when we see him in regard to his toes in order to keep this drier based on the location and how this is proceeding. 06/28/2020 on evaluation today patient appears to be doing a little bit more poorly overall in regard to the appearance of the skin I am actually somewhat concerned about the possibility of him having a little bit of an infection here. We discussed the course of potentially giving him a doxycycline prescription which he is taken previously with good result. With that being said I do believe that this is potentially mild and at this point easily fixed. I just do not want anything to get any worse. 07/12/2020 upon evaluation today patient actually appears to be making some progress with regard to his legs which is great news there does not appear to be any evidence of active infection. Overall very pleased with where things stand. 07/26/2020 upon evaluation today patient appears to be doing well with regard to his leg ulcers and toe ulcers at this point. He has been tolerating the compression wraps without complication overall very pleased in this regard. 08/09/2020 upon evaluation today patient appears  to be doing well with regard to his lower extremities bilaterally. Fortunately there is no signs of active infection overall I am pleased with where things stand. 08/23/2020 on evaluation today patient appears to be doing well with regard to his wound. He has been tolerating the dressing changes without complication. Fortunately there is no signs of active infection at this time. Overall his legs seem to be doing quite well which is great news and I am very pleased in that regard. No fevers, chills, nausea, vomiting, or diarrhea. 09/13/2020 upon evaluation today patient appears to be doing okay in regard to his lower extremities. He does have  a fairly large blister on the right leg which I did remove the blister tissue from today so we can get this to dry out other than that however he seems to be doing quite well. There is no signs of active infection at this time. 09/27/2020 upon evaluation today patient appears to actually be doing some better in regard to his right leg. Fortunately signs of active infection at this time which is great news. No fevers, chills, nausea, vomiting, or diarrhea. 10/04/2020 upon evaluation today patient actually appears to be showing signs of improvement which is great news with regard to his leg ulcers. Fortunately there is no signs of active infection which is great news he is still taking the antibiotics currently. No fevers, chills, nausea, vomiting, or diarrhea. 10/18/2020 on evaluation today patient appears to be doing well with regard to his legs currently. He has been tolerating the dressing changes including the wraps without complication. Fortunately there is no signs of active infection at this time. No fevers, chills, nausea, vomiting, or diarrhea. 10/25/2020 upon evaluation today patient appears to be doing decently well in regard to his wounds currently. He has been tolerating the dressing changes without complication. Overall I feel like he is making good progress albeit slow. Again this is something we can have to continue to wrap for some time to come most likely. 11/08/2020 upon evaluation today patient appears to be doing well with regard to his wounds currently. He has been tolerating the dressing changes without complication is not currently on any antibiotics and he does not appear to show any signs of infection. He does continue to have a lot of drainage on the right leg not too severe but nonetheless this is very scattered. On the left leg this is looking to be much improved overall. 11/15/2020 upon evaluation today patient appears to be doing better with regard to his legs bilaterally.  Especially the right leg which was much more significant last week. There does not appear to be any signs of active infection which is great news. No fevers, chills, nausea, vomiting, or diarrhea. 11/23/2019 upon evaluation today patient appears to be doing poorly still in regard to his lower extremities bilaterally. Unfortunately his right leg in particular appears to be doing much more poorly there is no signs really of infection this is not warm to touch but he does have a lot of drainage and weeping unfortunately. With that reason I do believe that we may need to initiate some treatment here to try to help calm down some of the swelling of the right leg. I think switching to a 4-layer compression wrap would be beneficial here. The patient is in agreement with giving this a try. 11/29/2020 upon evaluation today patient appears to be doing well currently in regard to his leg ulcers. I feel like the right leg is doing  better he still has a lot of drainage but we do see some improvement here. The 4-layer compression wrap I think was helpful. 12/06/2020 upon evaluation today patient appears to be doing well with regard to his legs. In fact they seem to be doing about the best I have seen up to this point. Fortunately there is no signs of active infection at this time. No fevers, chills, nausea, vomiting, or diarrhea. Objective Constitutional Well-nourished and well-hydrated in no acute distress. Vitals Time Taken: 10:21 AM, Height: 70 in, Weight: 380.2 lbs, BMI: 54.5, Temperature: 98.1 F, Pulse: 57 bpm, Respiratory Rate: 22 breaths/min, Blood Pressure: 129/29 mmHg, Capillary Blood Glucose: 135 mg/dl. Respiratory normal breathing without difficulty. Psychiatric this patient is able to make decisions and demonstrates good insight into disease process. Alert and Oriented x 3. pleasant and cooperative. General Notes: Upon inspection patient's wounds again on the right leg which are mainly large areas of  weeping have improved dramatically and in fact I see very little weeping at this point. I do believe the 4-layer compression wrap is also done better for him. The combination of that and the ketoconazole along with triamcinolone has been extremely beneficial. Integumentary (Hair, Skin) Wound #185 status is Open. Original cause of wound was Blister. The wound is located on the Right,Circumferential Lower Leg. The wound measures 8cm length x 31.8cm width x 0.1cm depth; 199.805cm^2 area and 19.981cm^3 volume. There is Fat Layer (Subcutaneous Tissue) exposed. There is no tunneling or undermining noted. There is a medium amount of serosanguineous drainage noted. The wound margin is distinct with the outline attached to the wound base. There is large (67-100%) red, pink granulation within the wound bed. There is no necrotic tissue within the wound bed. Wound #190 status is Healed - Epithelialized. Original cause of wound was Gradually Appeared. The wound is located on the Right T Second. The wound oe measures 0cm length x 0cm width x 0cm depth; 0cm^2 area and 0cm^3 volume. 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 There was a Four Layer Compression Therapy Procedure by Fonnie Mu, RN. Post procedure Diagnosis Wound #185: Same as Pre-Procedure There was a Four Layer Compression Therapy Procedure by Shawn Stall, RN. Post procedure Diagnosis Wound #: Same as  Pre-Procedure Plan Follow-up Appointments: Return Appointment in 2 weeks. - MD Nurse Visit: - 1 week Bathing/ Shower/ Hygiene: May shower with protection but do not get wound dressing(s) wet. Edema Control - Lymphedema / SCD / Other: Lymphedema Pumps. Use Lymphedema pumps on leg(s) 2-3 times a day for 45-60 minutes. If wearing any wraps or hose, do not remove them. Continue exercising as instructed. Elevate legs to the level of the heart or above for 30 minutes daily and/or when sitting, a frequency of: - throughout the day Avoid standing for long periods of time. Exercise regularly Other Edema Control Orders/Instructions: - 4 layer compression wrap to left leg Non Wound Condition: Other Non Wound Condition Orders/Instructions: - lotion to leg, 4 layer compression wrap WOUND #185: - Lower Leg Wound Laterality: Right, Circumferential Peri-Wound Care: Ketoconazole Cream  2% 1 x Per Week/ Discharge Instructions: Apply Ketoconazole mixed with TCA and zinc to lower leg Peri-Wound Care: Triamcinolone 15 (g) 1 x Per Week/ Discharge Instructions: Use triamcinolone 15 (g) as directed Peri-Wound Care: Zinc Oxide Ointment 30g tube 1 x Per Week/ Discharge Instructions: Apply Zinc Oxide to periwound with each dressing change Prim Dressing: KerraCel Ag Gelling Fiber Dressing, 4x5 in (silver alginate) 1 x Per Week/ ary Discharge Instructions: Apply silver alginate to wound bed as instructed Secondary Dressing: ABD Pad, 8x10 1 x Per Week/ Discharge Instructions: Apply over primary dressing as directed. Secondary Dressing: Zetuvit Plus 4x8 in 1 x Per Week/ Discharge Instructions: Apply over primary dressing as needed Com pression Wrap: FourPress (4 layer compression wrap) 1 x Per Week/ Discharge Instructions: Apply four layer compression unna layer at top to secure 1. I would recommend currently that regarding continue with the wound care measures as before and the patient is in agreement the plan.  This includes the use of the ketoconazole as well as triamcinolone to the right leg. 2. We will get a continue with bilateral 4-layer compression wrap I think that still the best way to go in that regard. 3. I would also recommend the patient continue to elevate his legs uses lymphedema pumps on a regular basis. We will see patient back for reevaluation in 1 week here in the clinic. If anything worsens or changes patient will contact our office for additional recommendations. That will be a nurse visit and I will see him for a provider visit in 2 weeks. Electronic Signature(s) Signed: 12/06/2020 11:19:27 AM By: Lenda Kelp PA-C Entered By: Lenda Kelp on 12/06/2020 11:19:27 -------------------------------------------------------------------------------- SuperBill Details Patient Name: Date of Service: [name], Henrick J. 12/06/2020 Medical Record Number: [id number] Patient Account Number: [id number] Date of Birth/Sex: Treating RN: [birth year] (70 y.o. [name] Primary Care Provider: Nicoletta Ba Other Clinician: Referring Provider: Treating Provider/Extender: Adele Dan in Treatment: 254 Diagnosis Coding ICD-10 Codes Code Description [id number] Non-pressure chronic ulcer of right calf limited to breakdown of skin L97.221 Non-pressure chronic ulcer of left calf limited to breakdown of skin 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 of bilateral lower extremity I89.0 Lymphedema, not elsewhere classified E11.622 Type 2 diabetes mellitus with other skin ulcer 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, unspecified L03.116 Cellulitis of left lower limb Facility Procedures CPT4: Code [id number] Description: 81 BILATERAL: Application of multi-layer venous compression system; leg (below knee), including ankle and t. 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

## 2020-12-06 NOTE — Progress Notes (Signed)
Kevin Brisco Cowperis a 70 y.o.malepresents to the office today for bicillininjections, per physician's orders.  Bicillin 600,000 Unitswas administeredbilaterally in Right and Left upper outter quadtoday. Patient tolerated injection.

## 2020-12-06 NOTE — Progress Notes (Signed)
EILAM, [place]RE[place]Y ([id number]) Visit Report for 12/06/2020 Arrival Information Details Patient Name: Date of Service: [name], [name] 12/06/2020 10:30 A M Medical Record Number: [id number] Patient Account Number: [id number] Date of Birth/Sex: Treating RN: 1951-05-11 (70 y.o. [name], [name] Primary Care Geovany Trudo: [name] Other Clinician: Referring Kyisha Fowle: Treating Jian Hodgman/Extender: [name] in Treatment: 73 Visit Information History Since Last Visit Added or deleted any medications: No Patient Arrived: [name] new allergies or adverse reactions: No Arrival Time: 10:20 Had a fall or experienced change in No Accompanied By: self activities of daily living that may affect Transfer Assistance: None risk of falls: Patient Identification Verified: Yes Signs or symptoms of abuse/neglect since last visito No Secondary Verification Process Completed: Yes Hospitalized since last visit: No Patient Requires Transmission-Based Precautions: No Implantable device outside of the clinic excluding No Patient Has Alerts: Yes cellular tissue based products placed in the center since last visit: Has Dressing in Place as Prescribed: Yes Has Compression in Place as Prescribed: Yes Pain Present Now: No Electronic Signature(s) Signed: 12/06/2020 5:41:58 PM By: Deon Pilling Entered By: Deon Pilling on 12/06/2020 10:33:29 -------------------------------------------------------------------------------- Compression Therapy Details Patient Name: Date of Service: [name], [name] 12/06/2020 10:30 A M Medical Record Number: [id number] Patient Account Number: [id number] Date of Birth/Sex: Treating RN: 31-Jul-1951 (70 y.o. [name] Primary Care Roshell Brigham: [name] Other Clinician: Referring Miliyah Luper: Treating Kiley Solimine/Extender: [name] in Treatment: 254 Compression Therapy Performed for Wound Assessment: NonWound Condition  Lymphedema - Left Leg Performed By: Clinician Deon Pilling, RN Compression Type: Four Layer Post Procedure Diagnosis Same as Pre-procedure Electronic Signature(s) Signed: 12/06/2020 5:50:42 PM By: Baruch Gouty RN, BSN Entered By: Baruch Gouty on 12/06/2020 11:16:31 -------------------------------------------------------------------------------- Compression Therapy Details Patient Name: Date of Service: [name], [name] 12/06/2020 10:30 A M Medical Record Number: [id number] Patient Account Number: [id number] Date of Birth/Sex: Treating RN: 1950-12-26 (70 y.o. [name] Primary Care [place] Weekly: [name] Other Clinician: Referring Sahid Borba: Treating Natasha Paulson/Extender: [name] in Treatment: 254 Compression Therapy Performed for Wound Assessment: Wound #185 Right,Circumferential Lower Leg Performed By: Clinician Rhae Hammock, RN Compression Type: Four Layer Post Procedure Diagnosis Same as Pre-procedure Electronic Signature(s) Signed: 12/06/2020 5:50:42 PM By: Baruch Gouty RN, BSN Entered By: Baruch Gouty on 12/06/2020 11:16:56 -------------------------------------------------------------------------------- Encounter Discharge Information Details Patient Name: Date of Service: [name], [name] 12/06/2020 10:30 A M Medical Record Number: [id number] Patient Account Number: [id number] Date of Birth/Sex: Treating RN: [birth year] (70 y.o. [name] Primary Care Jamaine Quintin: [name] Other Clinician: Referring Lively Haberman: Treating Jakiah Goree/Extender: [name] in Treatment: [phone] Encounter Discharge Information Items Discharge Condition: Stable Ambulatory Status: Walker Discharge Destination: Home Transportation: Private Auto Accompanied By: self Schedule Follow-up Appointment: Yes Clinical Summary of Care: Electronic Signature(s) Signed: 12/06/2020 5:41:58 PM By: Deon Pilling Entered By:  Deon Pilling on 12/06/2020 11:52:51 -------------------------------------------------------------------------------- Lower Extremity Assessment Details Patient Name: Date of Service: Kevin CHRISTIAN, BORGERDING 12/06/2020 10:30 A M Medical Record Number: [id number] Patient Account Number: [id number] Date of Birth/Sex: Treating RN: 12/02/1950 (70 y.o. [name] Primary Care Hyatt Capobianco: [name] Other Clinician: Referring Marilyn Nihiser: Treating Lizzett Nobile/Extender: [name] in Treatment: 254 Edema Assessment Assessed: [name]: Yes] [name]: Yes] Edema: [Left: Yes] [Right: Yes] Calf Left: Right: Point of Measurement: 25 cm From Medial Instep 39.5 cm 40 cm Ankle Left: Right: Point of Measurement: 9 cm From Medial Instep 26 cm 30  cm Electronic Signature(s) Signed: 12/06/2020 5:41:58 PM By: Deon Pilling Entered By: Deon Pilling on 12/06/2020 10:35:25 -------------------------------------------------------------------------------- Multi-Disciplinary Care Plan Details Patient Name: Date of Service: [name], [name] 12/06/2020 10:30 A M Medical Record Number: [id number] Patient Account Number: [id number] Date of Birth/Sex: Treating RN: 11-09-1950 (70 y.o. [name] Primary Care Tripp Goins: [name] Other Clinician: Referring Hargun Spurling: Treating Zailah Zagami/Extender: [name] in Treatment: [phone] Active Inactive Venous Leg Ulcer Nursing Diagnoses: Actual venous Insuffiency (use after diagnosis is confirmed) Goals: Patient will maintain optimal edema control Date Initiated: 09/10/2016 Target Resolution Date: 12/20/2020 Goal Status: Active Verify adequate tissue perfusion prior to therapeutic compression application Date Initiated: 09/10/2016 Date Inactivated: 11/28/2016 Goal Status: Met Interventions: Assess peripheral edema status every visit. Compression as ordered Provide education on venous  insufficiency Notes: Electronic Signature(s) Signed: 12/06/2020 5:50:42 PM By: Baruch Gouty RN, BSN Entered By: Baruch Gouty on 12/06/2020 11:13:44 -------------------------------------------------------------------------------- Pain Assessment Details Patient Name: Date of Service: [name], [name] 12/06/2020 10:30 A M Medical Record Number: [id number] Patient Account Number: [id number] Date of Birth/Sex: Treating RN: 1951-08-28 (70 y.o. [name] Primary Care Cristabel Bicknell: [name] Other Clinician: Referring Gustin [name]: Treating Huston Stonehocker/Extender: [name] in Treatment: [phone] Active Problems Location of Pain Severity and Description of Pain Patient Has Paino No Site Locations Rate the pain. Current Pain Level: 0 Pain Management and Medication Current Pain Management: Medication: No Cold Application: No Rest: No Massage: No Activity: No T.E.N.S.: No Heat Application: No Leg drop or elevation: No Is the Current Pain Management Adequate: Adequate How does your wound impact your activities of daily livingo Sleep: No Bathing: No Appetite: No Relationship With Others: No Bladder Continence: No Emotions: No Bowel Continence: No Work: No Toileting: No Drive: No Dressing: No Hobbies: No Electronic Signature(s) Signed: 12/06/2020 5:41:58 PM By: Deon Pilling Entered By: Deon Pilling on 12/06/2020 10:34:55 -------------------------------------------------------------------------------- Patient/Caregiver Education Details Patient Name: Date of Service: [name], [name] 2/2/2022andnbsp10:30 A M Medical Record Number: [id number] Patient Account Number: [id number] Date of Birth/Gender: Treating RN: 05-03-1951 (71 y.o. [name] Primary Care Physician: [name] Other Clinician: Referring Physician: Treating Physician/Extender: [name] in Treatment: [phone] Education Assessment Education  Provided To: Patient Education Topics Provided Venous: Methods: Explain/Verbal Responses: Reinforcements needed, State content correctly Electronic Signature(s) Signed: 12/06/2020 5:50:42 PM By: Baruch Gouty RN, BSN Entered By: Baruch Gouty on 12/06/2020 11:14:09 -------------------------------------------------------------------------------- Wound Assessment Details Patient Name: Date of Service: [name], [name] 12/06/2020 10:30 A M Medical Record Number: [id number] Patient Account Number: [id number] Date of Birth/Sex: Treating RN: July 27, 1951 (70 y.o. [name] Primary Care Kiani Wurtzel: [name] Other Clinician: Referring [name]: Treating Jaxon Mynhier/Extender: [name] in Treatment: 254 Wound Status Wound Number: 185 Primary Venous Leg Ulcer Etiology: Wound Location: Right, Circumferential Lower Leg Secondary Lymphedema Wounding Event: Blister Etiology: Date Acquired: 09/13/2020 Wound Open Weeks Of Treatment: 11 Status: Clustered Wound: Yes Comorbid Chronic sinus problems/congestion, Arrhythmia, Hypertension, History: Peripheral Arterial Disease, Type II Diabetes, History of Burn, Gout, Confinement Anxiety Wound Measurements Length: (cm) 8 Width: (cm) 31.8 Depth: (cm) 0.1 Clustered Quantity: 4 Area: (cm) 199.805 Volume: (cm) 19.981 % Reduction in Area: -113.7% % Reduction in Volume: -113.7% Epithelialization: Medium (34-66%) Tunneling: No Undermining: No Wound Description Classification: Full Thickness Without Exposed Support Structures Wound Margin: Distinct, outline attached Exudate Amount: Medium Exudate Type: Serosanguineous Exudate Color: red, brown Foul Odor After Cleansing: No Slough/Fibrino No Wound Bed Granulation  Amount: Large (67-100%) Exposed Structure Granulation Quality: Red, Pink Fascia Exposed: No Necrotic Amount: None Present (0%) Fat Layer (Subcutaneous Tissue) Exposed: Yes Tendon Exposed:  No Muscle Exposed: No Joint Exposed: No Bone Exposed: No Treatment Notes Wound #185 (Lower Leg) Wound Laterality: Right, Circumferential Cleanser Peri-Wound Care Ketoconazole Cream 2% Discharge Instruction: Apply Ketoconazole mixed with TCA and zinc to lower leg Triamcinolone 15 (g) Discharge Instruction: Use triamcinolone 15 (g) as directed Zinc Oxide Ointment 30g tube Discharge Instruction: Apply Zinc Oxide to periwound with each dressing change Topical Primary Dressing KerraCel Ag Gelling Fiber Dressing, 4x5 in (silver alginate) Discharge Instruction: Apply silver alginate to wound bed as instructed Secondary Dressing ABD Pad, 8x10 Discharge Instruction: Apply over primary dressing as directed. Zetuvit Plus 4x8 in Discharge Instruction: Apply over primary dressing as needed Secured With Compression Wrap FourPress (4 layer compression wrap) Discharge Instruction: Apply four layer compression unna layer at top to secure Compression Stockings Add-Ons Notes 4 layer compression to left leg. Electronic Signature(s) Signed: 12/06/2020 5:41:58 PM By: Deon Pilling Entered By: Deon Pilling on 12/06/2020 10:36:22 -------------------------------------------------------------------------------- Wound Assessment Details Patient Name: Date of Service: [name], [name] 12/06/2020 10:30 A M Medical Record Number: [id number] Patient Account Number: [id number] Date of Birth/Sex: Treating RN: 12-Jul-1951 (70 y.o. [name] Primary Care Tanessa Tidd: [name] Other Clinician: Referring Arshawn Valdez: Treating Makaelah Cranfield/Extender: [name] in Treatment: 254 Wound Status Wound Number: 190 Primary Etiology: Diabetic Wound/Ulcer of the Lower Extremity Wound Location: Right T Second oe Wound Status: Healed - Epithelialized Wounding Event: Gradually Appeared Date Acquired: 11/10/2020 Weeks Of Treatment: 3 Clustered Wound: No Wound Measurements Length:  (cm) Width: (cm) Depth: (cm) Area: (cm) Volume: (cm) 0 % Reduction in Area: 100% 0 % Reduction in Volume: 100% 0 0 0 Wound Description Classification: Grade 2 Treatment Notes Wound #190 (Toe Second) Wound Laterality: Right Cleanser Peri-Wound Care Topical Primary Dressing Secondary Dressing Secured With Compression Wrap Compression Stockings Add-Ons Notes 4 layer compression to left leg. Electronic Signature(s) Signed: 12/06/2020 5:41:58 PM By: Deon Pilling Entered By: Deon Pilling on 12/06/2020 10:35:37 -------------------------------------------------------------------------------- Vitals Details Patient Name: Date of Service: [name], [name] 12/06/2020 10:30 A M Medical Record Number: [id number] Patient Account Number: [id number] Date of Birth/Sex: Treating RN: 03-13-1951 (70 y.o. [name] Primary Care Brittnae Aschenbrenner: [name] Other Clinician: Referring Aneri Slagel: Treating Rozlyn Yerby/Extender: [name] in Treatment: 254 Vital Signs Time Taken: 10:21 Temperature (F): 98.1 Height (in): 70 Pulse (bpm): 57 Weight (lbs): 380.2 Respiratory Rate (breaths/min): 22 Body Mass Index (BMI): 54.5 Blood Pressure (mmHg): 129/29 Capillary Blood Glucose (mg/dl): 135 Reference Range: 80 - 120 mg / dl Electronic Signature(s) Signed: 12/06/2020 5:41:58 PM By: Deon Pilling Entered By: Deon Pilling on 12/06/2020 10:34:25

## 2020-12-08 ENCOUNTER — Other Ambulatory Visit: Payer: Self-pay | Admitting: Family Medicine

## 2020-12-13 ENCOUNTER — Ambulatory Visit: Payer: Medicare Other | Admitting: Family Medicine

## 2020-12-13 ENCOUNTER — Encounter (HOSPITAL_BASED_OUTPATIENT_CLINIC_OR_DEPARTMENT_OTHER): Payer: Medicare Other | Admitting: Physician Assistant

## 2020-12-13 ENCOUNTER — Other Ambulatory Visit: Payer: Self-pay

## 2020-12-13 DIAGNOSIS — L97211 Non-pressure chronic ulcer of right calf limited to breakdown of skin: Secondary | ICD-10-CM | POA: Diagnosis not present

## 2020-12-13 DIAGNOSIS — I890 Lymphedema, not elsewhere classified: Secondary | ICD-10-CM | POA: Diagnosis not present

## 2020-12-13 DIAGNOSIS — L97221 Non-pressure chronic ulcer of left calf limited to breakdown of skin: Secondary | ICD-10-CM | POA: Diagnosis not present

## 2020-12-13 DIAGNOSIS — E11622 Type 2 diabetes mellitus with other skin ulcer: Secondary | ICD-10-CM | POA: Diagnosis not present

## 2020-12-13 DIAGNOSIS — I87333 Chronic venous hypertension (idiopathic) with ulcer and inflammation of bilateral lower extremity: Secondary | ICD-10-CM | POA: Diagnosis not present

## 2020-12-13 DIAGNOSIS — L03116 Cellulitis of left lower limb: Secondary | ICD-10-CM | POA: Diagnosis not present

## 2020-12-13 DIAGNOSIS — E1140 Type 2 diabetes mellitus with diabetic neuropathy, unspecified: Secondary | ICD-10-CM | POA: Diagnosis not present

## 2020-12-13 NOTE — Progress Notes (Signed)
YAZEED, PRYER ([id number]) Visit Report for 12/13/2020 SuperBill Details Patient Name: Date of Service: CO BALTHAZAR, DOOLY 12/13/2020 Medical Record Number: [id number] Patient Account Number: [id number] Date of Birth/Sex: Treating RN: 07-07-1951 (70 y.o. [name] Primary Care Provider: Nicoletta Ba Other Clinician: Referring Provider: Treating Provider/Extender: Adele Dan in Treatment: 255 Diagnosis Coding ICD-10 Codes Code Description [phone] Non-pressure chronic ulcer of right calf limited to breakdown of skin L97.221 Non-pressure chronic ulcer of left calf limited to breakdown of skin 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 of bilateral lower extremity I89.0 Lymphedema, not elsewhere classified E11.622 Type 2 diabetes mellitus with other skin ulcer 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, unspecified L03.116 Cellulitis of left lower limb Facility Procedures CPT4 Description Modifier Quantity Code [id number] BILATERAL: Application of multi-layer venous compression system; leg (below knee), including ankle and 1 foot. Electronic Signature(s) Signed: 12/13/2020 5:02:12 PM By: [name] RN, BSN Signed: 12/13/2020 6:51:30 PM By: Lenda Kelp PA-C Entered By: [name] on 12/13/2020 10:36:51

## 2020-12-13 NOTE — Progress Notes (Signed)
BILL, YOHN ([id number]) Visit Report for 12/13/2020 Arrival Information Details Patient Name: Date of Service: CO ADRIANN, BALLWEG 12/13/2020 10:30 A M Medical Record Number: [id number] Patient Account Number: [id number] Date of Birth/Sex: Treating RN: [birth year] (70 y.o. [name] Primary Care Alaiya Martindelcampo: Nicoletta Ba Other Clinician: Referring Jacobe Study: Treating Travarus Trudo/Extender: Adele Dan in Treatment: 255 Visit Information History Since Last Visit Added or deleted any medications: No Patient Arrived: [name] Any new allergies or adverse reactions: No Arrival Time: 10:31 Had a fall or experienced change in No Accompanied By: alone activities of daily living that may affect Transfer Assistance: None risk of falls: Patient Identification Verified: Yes Signs or symptoms of abuse/neglect since last visito No Secondary Verification Process Completed: Yes Hospitalized since last visit: No Patient Requires Transmission-Based Precautions: No Implantable device outside of the clinic excluding No Patient Has Alerts: Yes cellular tissue based products placed in the center since last visit: Has Dressing in Place as Prescribed: Yes Has Compression in Place as Prescribed: Yes Pain Present Now: No Electronic Signature(s) Signed: 12/13/2020 5:02:12 PM By: [name] RN, BSN Entered By: [name] on 12/13/2020 10:32:30 -------------------------------------------------------------------------------- Compression Therapy Details Patient Name: Date of Service: [name], Erroll J. 12/13/2020 10:30 A M Medical Record Number: [id number] Patient Account Number: [id number] Date of Birth/Sex: Treating RN: 09-05-1951 (70 y.o. [name] Primary Care Milanni Ayub: Nicoletta Ba Other Clinician: Referring Freyja Govea: Treating Lyllie Cobbins/Extender: Adele Dan in Treatment: 255 Compression Therapy Performed for Wound Assessment: Wound  #185 Right,Circumferential Lower Leg Performed By: Clinician [name], RN Compression Type: Four Layer Electronic Signature(s) Signed: 12/13/2020 5:02:12 PM By: [name] RN, BSN Entered By: [name] on 12/13/2020 10:35:34 -------------------------------------------------------------------------------- Compression Therapy Details Patient Name: Date of Service: [name], Felice J. 12/13/2020 10:30 A M Medical Record Number: [id number] Patient Account Number: [id number] Date of Birth/Sex: Treating RN: April 18, 1951 (70 y.o. [name] Primary Care Cobe Viney: Nicoletta Ba Other Clinician: Referring Saraiyah Hemminger: Treating Selestino Nila/Extender: Adele Dan in Treatment: 255 Compression Therapy Performed for Wound Assessment: NonWound Condition Lymphedema - Left Leg Performed By: Clinician [name], RN Compression Type: Four Layer Electronic Signature(s) Signed: 12/13/2020 5:02:12 PM By: [name] RN, BSN Entered By: [name] on 12/13/2020 10:35:55 -------------------------------------------------------------------------------- Encounter Discharge Information Details Patient Name: Date of Service: [name], Tatsuya J. 12/13/2020 10:30 A M Medical Record Number: [id number] Patient Account Number: [id number] Date of Birth/Sex: Treating RN: 01/22/1951 (70 y.o. [name] Primary Care Glenroy Crossen: Nicoletta Ba Other Clinician: Referring Charie Pinkus: Treating Cintya Daughety/Extender: Adele Dan in Treatment: [phone] Encounter Discharge Information Items Discharge Condition: Stable Ambulatory Status: Walker Discharge Destination: Home Transportation: Private Auto Accompanied By: alone Schedule Follow-up Appointment: Yes Clinical Summary of Care: Patient Declined Electronic Signature(s) Signed: 12/13/2020 5:02:12 PM By: [name] RN, BSN Entered By: [name] on 12/13/2020  10:36:40 -------------------------------------------------------------------------------- Wound Assessment Details Patient Name: Date of Service: [name], Gabriella J. 12/13/2020 10:30 A M Medical Record Number: [id number] Patient Account Number: [id number] Date of Birth/Sex: Treating RN: [birth year] (70 y.o. [name] Primary Care Locklan Canoy: Nicoletta Ba Other Clinician: Referring Akeel Reffner: Treating Caliope Ruppert/Extender: Adele Dan in Treatment: 255 Wound Status Wound Number: [id number] Primary Venous Leg Ulcer Etiology: Wound Location: Right, Circumferential Lower Leg Secondary Lymphedema Wounding Event: Blister Etiology: Date Acquired: 09/13/2020 Wound Open Weeks Of Treatment: 12 Status: Clustered Wound: Yes Comorbid Chronic sinus problems/congestion, Arrhythmia, Hypertension, History: Peripheral Arterial Disease, Type II Diabetes,  History of Burn, Gout, Confinement Anxiety Wound Measurements Length: (cm) 8 Width: (cm) 31.8 Depth: (cm) 0.1 Clustered Quantity: 4 Area: (cm) 199.805 Volume: (cm) 19.981 % Reduction in Area: -113.7% % Reduction in Volume: -113.7% Epithelialization: Medium (34-66%) Tunneling: No Undermining: No Wound Description Classification: Full Thickness Without Exposed Support Stru Wound Margin: Distinct, outline attached Exudate Amount: Medium Exudate Type: Serosanguineous Exudate Color: red, brown ctures Foul Odor After Cleansing: No Slough/Fibrino No Wound Bed Granulation Amount: Large (67-100%) Exposed Structure Granulation Quality: Red, Pink Fascia Exposed: No Necrotic Amount: None Present (0%) Fat Layer (Subcutaneous Tissue) Exposed: Yes Tendon Exposed: No Muscle Exposed: No Joint Exposed: No Bone Exposed: No Treatment Notes Wound #185 (Lower Leg) Wound Laterality: Right, Circumferential Cleanser Peri-Wound Care Ketoconazole Cream 2% Discharge Instruction: Apply Ketoconazole mixed with TCA and zinc to lower  leg Zinc Oxide Ointment 30g tube Discharge Instruction: Apply Zinc Oxide to periwound with each dressing change Triamcinolone 15 (g) Discharge Instruction: Use triamcinolone 15 (g) as directed Topical Primary Dressing KerraCel Ag Gelling Fiber Dressing, 4x5 in (silver alginate) Discharge Instruction: Apply silver alginate to wound bed as instructed Secondary Dressing ABD Pad, 8x10 Discharge Instruction: Apply over primary dressing as directed. Zetuvit Plus 4x8 in Discharge Instruction: Apply over primary dressing as needed Secured With Compression Wrap FourPress (4 layer compression wrap) Discharge Instruction: Apply four layer compression unna layer at top to secure Compression Stockings Add-Ons Electronic Signature(s) Signed: 12/13/2020 5:02:12 PM By: [name] RN, BSN Entered By: [name] on 12/13/2020 10:34:37 -------------------------------------------------------------------------------- Vitals Details Patient Name: Date of Service: [name], Detrell J. 12/13/2020 10:30 A M Medical Record Number: [id number] Patient Account Number: [id number] Date of Birth/Sex: Treating RN: 1951-06-16 (70 y.o. [name] Primary Care Lakita Sahlin: Nicoletta Ba Other Clinician: Referring Oralee Rapaport: Treating Jovon Winterhalter/Extender: Adele Dan in Treatment: 255 Vital Signs Time Taken: 10:33 Temperature (F): 98.0 Height (in): 70 Pulse (bpm): 80 Weight (lbs): 380.2 Respiratory Rate (breaths/min): 22 Body Mass Index (BMI): 54.5 Blood Pressure (mmHg): 183/75 Capillary Blood Glucose (mg/dl): 734 Reference Range: 80 - 120 mg / dl Notes glucose per pt report Electronic Signature(s) Signed: 12/13/2020 5:02:12 PM By: [name] RN, BSN Entered By: [name] on 12/13/2020 10:33:53

## 2020-12-19 ENCOUNTER — Encounter: Payer: Self-pay | Admitting: Infectious Diseases

## 2020-12-19 ENCOUNTER — Other Ambulatory Visit: Payer: Self-pay

## 2020-12-19 ENCOUNTER — Telehealth (INDEPENDENT_AMBULATORY_CARE_PROVIDER_SITE_OTHER): Payer: Medicare Other | Admitting: Infectious Diseases

## 2020-12-19 DIAGNOSIS — L03119 Cellulitis of unspecified part of limb: Secondary | ICD-10-CM | POA: Diagnosis not present

## 2020-12-19 NOTE — Assessment & Plan Note (Signed)
We spent some time discussing treatment options today.  He does still have multiple risk factors for recurrent cellulitis including open wounds on his leg, frequent blisters, chronic severe lymphedema, obesity, and venous stasis.  He has not had any recommendations to present to the hospital since continuing on preventative antibiotics.  While I do agree I do not want to use these unnecessarily for him I do think that these are narrow enough to target streptococcus without risking drug resistance.  We did discuss potentially switching to a higher dose amoxicillin prophylactic versus on-demand dosing schedule.  He ultimately would like to continue the injections for now.  Sounds like he is having some trouble with bleeding side effect given he is on blood thinners as well.  I told him to make sure that he spoke with his clinical team to hold pressure following injection or possibly use Z-track method to help along with gauze and tape dressing.  For flares of cellulitis with continue with anti-MRSA therapies.  Doxycycline seems to be the most benign for him.  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  Linezolid causes severe GI disruption for him and he would refuse this if ever recommended.  Potentially if we could intervene sooner not could do dalbavancin infusion outpatient.   RTC in 6 months for follow up.

## 2020-12-19 NOTE — Patient Instructions (Signed)
It was very nice to talk to you on the phone today.  I am glad to hear that you are staying well and keeping healthy.    We talked about continuing the your preventative antibiotic injections for cellulitis. The goal is to keep these infections as infrequent as possible and keep you out of the hospital.  We can discuss a different treatment plan next time we talk in 6 months - I would be open to trying to transition you to an antibiotic either once a day or on demand (meaning you start taking it when you recognize the signs of cellulitis) called AMOXACILLIN.  For now we discussed continuing the injections at current interval.   Recommended Follow Up: 6 months

## 2020-12-19 NOTE — Progress Notes (Signed)
Name: [name]  DOB: [birth year]  MRN: [id number]  PCP: [name], MD  Referring Provider: Dr. Leanord Hawking   Virtual Visit via Mychart Video  I connected with [name] on 12/19/20 at 10:15 AM EST by VIDEO Portal and verified that I am speaking with the correct person using two identifiers.   I discussed the limitations, risks, security and privacy concerns of performing an evaluation and management via virtual service and the availability of in person appointments. I also discussed with the patient that there may be a patient responsible charge related to this service. The patient expressed understanding and agreed to proceed.  Patient Location: [place], [place]  Provider Location: [facility]    Subjective:   CC:  Follow up on recurrent lower extremity cellulitis infections on prophylactic bicillin injections.  Would like to discuss stopping injections if possible.    HPI: Patient is doing well today.  Not making much headway with weight loss but still trying different things.  Since our last office visit he states that he had required treatment with Bactrim once.  This increased his kidney function quite a bit, however at the next check it was normal.  Aside from that he has not had another infection requiring treatment since around August or so from the best he can recall.  Continues to follow with the wound center and is now seeing [facility].   Would like to discuss stopping IM injections for cellulitis prevention. Finds that he is bleeding through his clothes after these injections (on blood thinner) despite band aid.      Review of Systems  Constitutional: Negative for chills, fever, malaise/fatigue and weight loss.  HENT: Negative for sore throat.   Respiratory: Negative for cough and sputum production.   Cardiovascular: Positive for leg swelling. Negative for chest pain, palpitations and claudication.  Gastrointestinal: Negative for abdominal pain, diarrhea  and vomiting.  Genitourinary: Negative for dysuria and flank pain.  Musculoskeletal: Negative for joint pain.  Skin: Negative for rash.  Neurological: Negative for dizziness and headaches.    Past Medical History:  Diagnosis Date  . ALLERGIC RHINITIS 08/11/2006  . ASTHMA 08/11/2006  . Chronic atrial fibrillation (HCC) 08/2014  . Chronic combined systolic and diastolic CHF (congestive heart failure) [facility])    Cardiology f/u 12/2017: pt volume overloaded (R heart dysf suspected), BNP very high, lasix increased.  Repeat echo 12/2017: normal LV EF, mild DD, +RV syst dysfxn, mod pulm HTN, biatrial enlgmt.  . Chronic constipation   . Chronic renal insufficiency, stage 2 (mild)    Borderline stage III (GFR 60s).  . DIABETES MELLITUS, TYPE II 08/11/2006  . DM W/RENAL MANIFESTATIONS, TYPE II 04/20/2007  . HYPERTENSION 08/11/2006  . Impaired mobility and endurance   . MRSA infection 06/2018   LL venous stasis ulcer infected  . Normocytic anemia 2016-2019   03/2018 B12 normal, iron ok (ferritin borderline low).  Plan repeat CBC at f/u summer 2019 and if decreased from baseline will check hemoccults and iron labs again.  . OBESITY, MORBID 12/14/2007   saxenda started 05/2020 by [facility] wt mgmt center  . OSA (obstructive sleep apnea)    to get sleep study with [facility]) as of 12/03/2018 consult.  . 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  92018 ID started him on amoxil prophylaxis---ineffective.  End 2018/Jan 2019 penicillin G injections prophyl helpful but pt declined to continue this as of 01/2018 ID f/u.  ID talked him into resuming monthly penicillin G as of 02/2018 f/u.    Marland Kitchen Restless leg syndrome    Rx'd clonazepam 09/2017 and pt refused to take it after reading the medication's potential side effects.  . Venous stasis ulcers of both lower extremities (HCC)    Severe lymphedema.  wound clinic care ongoing as of 01/2018    Outpatient Medications Prior to Visit  Medication Sig Dispense Refill  . arginine 500 MG tablet Take by mouth.    Marland Kitchen b complex vitamins capsule Take 1 capsule by mouth every morning.    . butalbital-acetaminophen-caffeine (FIORICET WITH CODEINE) 50-325-40-30 MG capsule TAKE 1 TO 2 CAPSULES BY  MOUTH EVERY 6 HOURS AS  NEEDED FOR HEADACHE(S)  MANUFACTURER RECOMMENDS NOT EXCEEDING 6 CAPSULES/DAY 60 capsule 0  . cetirizine (ZYRTEC) 10 MG tablet Take 10 mg by mouth daily.    . Coenzyme Q10 (CO Q 10) 10 MG CAPS Take by mouth. Reported on 02/19/2016    . diltiazem (TIAZAC) 240 MG 24 hr capsule TAKE 1 CAPSULE BY MOUTH  DAILY 90 capsule 1  . furosemide (LASIX) 40 MG tablet TAKE 2 TABLETS BY MOUTH TWO TIMES DAILY (Patient taking differently: Take 80 mg in the mornings and 40 mg in the evenings) 360 tablet 3  . Glutamine 500 MG CAPS Take by mouth.    Marland Kitchen HYDROcodone-acetaminophen (NORCO/VICODIN) 5-325 MG tablet 1-2 tabs po bid prn pain 60 tablet 0  . Insulin Lispro Prot & Lispro (HUMALOG MIX 75/25 KWIKPEN) (75-25) 100 UNIT/ML Kwikpen INJECT SUBCUTANEOUSLY 25  UNITS EVERY MORNING AND 20  UNITS EVERY EVENING AT  SUPPER 45 mL 3  . Insulin Pen Needle (BD ULTRA-FINE PEN NEEDLES) 29G X 12.7MM MISC USE TWICE DAILY 200 each 3  . lisinopril (ZESTRIL) 40 MG tablet Take 1/2 tablet(20mg ) by mouth daily. 90 tablet 1  . metFORMIN (GLUCOPHAGE) 1000 MG tablet TAKE 1 TABLET BY MOUTH  TWICE DAILY WITH MEALS 180 tablet 1  . metoprolol tartrate (LOPRESSOR) 50 MG tablet TAKE 1 AND 1/2 TABLETS BY  MOUTH TWICE DAILY 270 tablet 3  . Multiple Vitamin (MULTIVITAMIN) tablet Take 1 tablet by mouth daily.    [name] ULTRA test strip CHECK BLOOD SUGAR TWICE  DAILY 200 strip 3  . rivaroxaban (XARELTO) 20 MG TABS tablet TAKE 1 TABLET BY MOUTH ONCE DAILY WITH SUPPER 90 tablet 3  . saw palmetto 500 MG capsule Take 500 mg by mouth daily.    Marland Kitchen sulfamethoxazole-trimethoprim (BACTRIM DS) 800-160 MG tablet Take 1 tablet by mouth 2 (two) times  daily.    Marland Kitchen terazosin (HYTRIN) 10 MG capsule TAKE 1 CAPSULE BY MOUTH  ONCE DAILY AT BEDTIME 90 capsule 3  . vitamin E 400 UNIT capsule Take 400 Units by mouth every morning. Selenium 50mg      Facility-Administered Medications Prior to Visit  Medication Dose Route Frequency Provider Last Rate Last Admin  . penicillin g benzathine (BICILLIN LA) 1200000 UNIT/2ML injection 600,000 Units  600,000 Units Intramuscular Q30 days , MD   600,000 Units at 04/12/20 0933  . penicillin g benzathine (BICILLIN LA) 1200000 UNIT/2ML injection 600,000 Units  600,000 Units Intramuscular Q30 days 06/12/20, MD   600,000 Units at 04/12/20 0936  . penicillin g benzathine (BICILLIN LA) 1200000 UNIT/2ML injection 600,000 Units  600,000 Units Intramuscular Q30 days 06/12/20, MD   600,000 Units at 12/06/20 [phone]  . penicillin g benzathine (BICILLIN LA) 1200000 UNIT/2ML injection 600,000 Units  600,000 Units Intramuscular Q30  days [name], MD   600,000 Units at 12/06/20 4765   Allergies  Allergen Reactions  . Other Other (See Comments)    Sneezing, coughing    Physical Exam and Objective Findings: There were no vitals filed for this visit. There is no height or weight on file to calculate BMI.  Physical Exam HENT:     Head: Normocephalic.  Eyes:     General: No scleral icterus.    Pupils: Pupils are equal, round, and reactive to light.  Pulmonary:     Effort: Pulmonary effort is normal.  Musculoskeletal:     Cervical back: Normal range of motion.  Neurological:     Mental Status: He is alert and oriented to person, place, and time.  Psychiatric:        Mood and Affect: Mood and affect normal.        Judgment: Judgment normal.      ASSESSMENT & PLAN:  Problem List Items Addressed This Visit      Unprioritized   Recurrent cellulitis of lower extremity    We spent some time discussing treatment options today.  He does still have multiple risk factors for  recurrent cellulitis including open wounds on his leg, frequent blisters, chronic severe lymphedema, obesity, and venous stasis.  He has not had any recommendations to present to the hospital since continuing on preventative antibiotics.  While I do agree I do not want to use these unnecessarily for him I do think that these are narrow enough to target streptococcus without risking drug resistance.  We did discuss potentially switching to a higher dose amoxicillin prophylactic versus on-demand dosing schedule.  He ultimately would like to continue the injections for now.  Sounds like he is having some trouble with bleeding side effect given he is on blood thinners as well.  I told him to make sure that he spoke with his clinical team to hold pressure following injection or possibly use Z-track method to help along with gauze and tape dressing.  For flares of cellulitis with continue with anti-MRSA therapies.  Doxycycline seems to be the most benign for him.  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  Linezolid causes severe GI disruption for him and he would refuse this if ever recommended.  Potentially if we could intervene sooner not could do dalbavancin infusion outpatient.   RTC in 6 months for follow up.         Follow Up Instructions: Continue Bicillin 1.2 million units IM Q4 weeks FU again in 6 months   I discussed the assessment and treatment plan with the patient. The patient was provided an opportunity to ask questions and all were answered. The patient agreed with the plan and demonstrated an understanding of the instructions.   The patient was advised to call back or seek an in-person evaluation if the symptoms worsen or if the condition fails to improve as anticipated.   Rexene Alberts, MSN, NP-C [facility]  [email or web address] Pager: [phone] Office: [phone] [facility] Main Line: [phone]    12/19/2020

## 2020-12-20 ENCOUNTER — Encounter (HOSPITAL_BASED_OUTPATIENT_CLINIC_OR_DEPARTMENT_OTHER): Payer: Medicare Other | Admitting: Physician Assistant

## 2020-12-20 ENCOUNTER — Other Ambulatory Visit: Payer: Self-pay

## 2020-12-20 DIAGNOSIS — L03116 Cellulitis of left lower limb: Secondary | ICD-10-CM | POA: Diagnosis not present

## 2020-12-20 DIAGNOSIS — I87333 Chronic venous hypertension (idiopathic) with ulcer and inflammation of bilateral lower extremity: Secondary | ICD-10-CM | POA: Diagnosis not present

## 2020-12-20 DIAGNOSIS — I890 Lymphedema, not elsewhere classified: Secondary | ICD-10-CM | POA: Diagnosis not present

## 2020-12-20 DIAGNOSIS — L97221 Non-pressure chronic ulcer of left calf limited to breakdown of skin: Secondary | ICD-10-CM | POA: Diagnosis not present

## 2020-12-20 DIAGNOSIS — E1140 Type 2 diabetes mellitus with diabetic neuropathy, unspecified: Secondary | ICD-10-CM | POA: Diagnosis not present

## 2020-12-20 DIAGNOSIS — L97522 Non-pressure chronic ulcer of other part of left foot with fat layer exposed: Secondary | ICD-10-CM | POA: Diagnosis not present

## 2020-12-20 DIAGNOSIS — E11622 Type 2 diabetes mellitus with other skin ulcer: Secondary | ICD-10-CM | POA: Diagnosis not present

## 2020-12-20 DIAGNOSIS — L97211 Non-pressure chronic ulcer of right calf limited to breakdown of skin: Secondary | ICD-10-CM | POA: Diagnosis not present

## 2020-12-20 NOTE — Progress Notes (Addendum)
UGO, THOMA ([id number]) Visit Report for 12/20/2020 Chief Complaint Document Details Patient Name: Date of Service: [name], [name] 12/20/2020 10:30 A M Medical Record Number: [id number] Patient Account Number: [id number] Date of Birth/Sex: Treating RN: 1951/04/27 (70 y.o. [name] Primary Care Provider: Nicoletta Ba Other Clinician: Referring Provider: Treating Provider/Extender: Adele Dan in Treatment: 256 Information Obtained from: Patient Chief Complaint patient is here for evaluation venous/lymphedema weeping Electronic Signature(s) Signed: 12/20/2020 10:21:00 AM By: Lenda Kelp PA-C Entered By: Lenda Kelp on 12/20/2020 10:21:00 -------------------------------------------------------------------------------- HPI Details Patient Name: Date of Service: [name], [name] 12/20/2020 10:30 A M Medical Record Number: [id number] Patient Account Number: [id number] Date of Birth/Sex: Treating RN: 1951-07-27 (70 y.o. [name] Primary Care Provider: Nicoletta Ba Other Clinician: Referring Provider: Treating Provider/Extender: Adele Dan in Treatment: 256 History of Present Illness HPI Description: Referred by PCP for consultation. Patient has long standing history of BLE venous stasis, no prior ulcerations. At beginning of month, developed cellulitis and weeping. Received IM Rocephin followed by Keflex and resolved. Wears compression stocking, appr 6 months old. Not sure strength. No present drainage. 01/22/16 this is a patient who is a type II diabetic on insulin. 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 He tells me he religiously wears pressure stockings of uncertain strength. He was here with weeping edema about 8 months ago but did not have an open wound. Roughly a month ago he had a reopening on his bilateral legs. He is been using bandages and Neosporin. He  does not complain of pain. 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 Last BUN/creatinine I have is from 11/20/15 at 13 and 1.0 respectively 01/29/16; patient arrives today having tolerated the Profore wrap. He brought in his stockings and these are 18 mmHg stockings he bought from [place]. The compression here is likely inadequate. He does not complain of pain or excessive drainage she has no systemic symptoms. The wound on the right looks improved as does the one on the left although one on the left is more substantial with still tissue at risk below the actual wound area on the bilateral posterior calf 02/05/16; patient arrives with poor edema control. He states that we did put a 4 layer compression on it last week. No weight appear 5 this. 02/12/16; the area on the posterior right Has healed. The left Has a substantial wound that has necrotic surface eschar that requires a debridement with a curette. 02/16/16;the patient called or a Nurse visit secondary to increased swelling. He had been in earlier in the week with his right leg healed. He was transitioned to is on pressure stocking on the right leg with the only open wound on the left, a substantial area on the left posterior calf. Note he has a history of severe lower extremity edema, he has a history of chronic atrial fibrillation but not heart failure per my notes but I'll need to research this. He is not complaining of chest pain shortness of breath or orthopnea. The intake nurse noted blisters on the previously closed right leg 02/19/16; this is the patient's regular visit day. I see him on Friday with escalating edema new wounds on the right leg and clear signs of at least right ventricular heart failure. I increased his Lasix to 40 twice a day.  He is returning currently in follow-up. States he is noticed a decrease in that the  edema 02/26/16 patient's legs have much less edema. There is nothing really open on the right leg. The left leg has improved condition of the large superficial wound on the posterior left leg 03/04/16; edema control is very much better. The patient's right leg wounds have healed. On the left leg he continues to have severe venous inflammation on the posterior aspect of the left leg. There is no tenderness and I don't think any of this is cellulitis. 03/11/16; patient's right leg is married healed and he is in his own stocking. The patient's left leg has deteriorated somewhat. There is a lot of erythema around the wound on the posterior left leg. There is also a significant rim of erythema posteriorly just above where the wrap would've ended there is a new wound in this location and a lot of tenderness. Can't rule out cellulitis in this area. 03/15/16; patient's right leg remains healed and he is in his own stocking. The patient's left leg is much better than last review. His major wound on the posterior aspect of his left Is almost fully epithelialized. He has 3 small injuries from the wraps. Really. Erythema seems a lot better on antibiotics 03/18/16; right leg remains healed and he is in his own stocking. The patient's left leg is much better. The area on the posterior aspect of the left calf is fully epithelialized. His 3 small injuries which were wrap injuries on the left are improved only one seems still open his erythema has resolved 03/25/16; patient's right leg remains healed and he is in his own stocking. There is no open area today on the left leg posterior leg is completely closed up. His wrap injuries at the superior aspect of his leg are also resolved. He looks as though he has some irritation on the dorsal ankle but this is fully epithelialized without evidence of infection. 03/28/16; we discharged this patient on Monday. Transitioned him into his own stocking. There were problems almost  immediately with uncontrolled swelling weeping edema multiple some of which have opened. He does not feel systemically unwell in particular no chest pain no shortness of breath and he does not feel 04/08/16; the edema is under better control with the Profore light wrap but he still has pitting edema. There is one large wound anteriorly 2 on the medial aspect of his left leg and 3 small areas on the superior posterior calf. Drainage is not excessive he is tolerating a Profore light well 04/15/16; put a Profore wrap on him last week. This is controlled is edema however he had a lot of pain on his left anterior foot most of his wounds are healed 04/22/16 once again the patient has denuded areas on the left anterior foot which he states are because his wrap slips up word. He saw his primary physician today is on Lasix 40 twice a day and states that he his weight is down 20 pounds over the last 3 months. 04/29/16: Much improved. left anterior foot much improved. He is now on Lasix 80 mg per day. Much improved edema control 05/06/16; I was hoping to be able to discharge him today however once again he has blisters at a low level of where the compression was placed last week mostly on his left lateral but also his left medial leg and a small area on the anterior part of the left foot. 05/09/16; apparently the patient went  home after his appointment on 7/4 later in the evening developing pain in his upper medial thigh together with subjective fever and chills although his temperature was not taken. The pain was so intense he felt he would probably have to call 911. However he then remembered that he had leftover doxycycline from a previous round of antibiotics and took these. By the next morning he felt a lot better. He called and spoke to one of our nurses and I approved doxycycline over the phone thinking that this was in relation to the wounds we had previously seen although they were definitely were not. The  patient feels a lot better old fever no chills he is still working. Blood sugars are reasonably controlled 05/13/16; patient is back in for review of his cellulitis on his anterior medial upper thigh. He is taking doxycycline this is a lot better. Culture I did of the nodular area on the dorsal aspect of his foot grew MRSA this also looks a lot better. 05/20/16; the patient is cellulitis on the medial upper thigh has resolved. All of his wound areas including the left anterior foot, areas on the medial aspect of the left calf and the lateral aspect of the calf at all resolved. He has a new blister on the left dorsal foot at the level of the fourth toe this was excised. No evidence of infection 05/27/16; patient continues to complain weeping edema. He has new blisterlike wounds on the left anterior lateral and posterior lateral calf at the top of his wrap levels. The area on his left anterior foot appears better. He is not complaining of fever, pain or pruritus in his feet. 05/30/16; the patient's blisters on his left anterior leg posterior calf all look improved. He did not increase the Lasix 100 mg as I suggested because he was going to run out of his 40 mg tablets. He is still having weeping edema of his toes 06/03/16; I renewed his Lasix at 80 mg once a day as he was about to run out when I last saw him. He is on 80 mg of Lasix now. I have asked him to cut down on the excessive amount of water he was drinking and asked him to drink according to his thirst mechanisms 06/12/2016 -- was seen 2 days ago and was supposed to wear his compression stockings at home but he is developed lymphedema and superficial blisters on the left lower extremity and hence came in for a review 06/24/16; the remaining wound is on his left anterior leg. He still has edema coming from between his toes. There is lymphedema here however his edema is generally better than when I last saw this. He has a history of atrial fibrillation  but does not have a known history of congestive heart failure nevertheless I think he probably has this at least on a diastolic basis. 07/01/16 I reviewed his echocardiogram from January 2017. This was essentially normal. He did not have LVH, EF of 55-60%. His right ventricular function was normal although he did have trivial tricuspid and pulmonic regurgitation. This is not audible on exam however. I increased his Lasix to do massive edema in his legs well above his knees I think in early July. He was also drinking an excessive amount of water at the time. 07/15/16; missed his appointment last week because of the Labor Day holiday on Monday. He could not get another appointment later in the week. Started to feel the wrap digging in superiorly so  we remove the top half and the bottom half of his wrap. He has extensive erythema and blistering superiorly in the left leg. Very tender. Very swollen. Edema in his foot with leaking edema fluid. He has not been systemically unwell 07/22/16; the area on the left leg laterally required some debridement. The medial wounds look more stable. His wrap injury wounds appear to have healed. Edema and his foot is better, weeping edema is also better. He tells me he is meeting with the supplier of the external compression pumps at work 08/05/16; the patient was on vacation last week in [facility]. His wrap is been on for an extended period of time. Also over the weekend he developed an extensive area of tender erythema across his anterior medial thigh. He took to doxycycline yesterday that he had leftover from a previous prescription. The patient complains of weeping edema coming out of his toes 08/08/16; I saw this patient on 10/2. He was tender across his anterior thigh. I put him on doxycycline. He returns today in follow-up. He does not have any open wounds on his lower leg, he still has edema weeping into his toes. 08/12/16; patient was seen back urgently today to  follow-up for his extensive left thigh cellulitis/erysipelas. He comes back with a lot less swelling and erythema pain is much better. I believe I gave him Augmentin and Cipro. His wrap was cut down as he stated a roll down his legs. He developed blistering above the level of the wrap that remained. He has 2 open blisters and 1 intact. 08/19/16; patient is been doing his primary doctor who is increased his Lasix from 40-80 once a day or 80 already has less edema. Cellulitis has remained improved in the left thigh. 2 open areas on the posterior left calf 08/26/16; he returns today having new open blisters on the anterior part of his left leg. He has his compression pumps but is not yet been shown how to use some vital representative from the supplier. 09/02/16 patient returns today with no open wounds on the left leg. Some maceration in his plantar toes 09/10/2016 -- Dr. Leanord Hawking had recently discharged him on 09/02/2016 and he has come right back with redness swelling and some open ulcers on his left lower extremity. He says this was caused by trying to apply his compression stockings and he's been unable to use this and has not been able to use his lymphedema pumps. He had some doxycycline leftover and he has started on this a few days ago. 09/16/16; there are no open wounds on his leg on the left and no evidence of cellulitis. 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 He does not complain of symptoms here. I am not clear use using his external compression pumps. 09/23/16; I have not seen this patient in 2 weeks. He canceled his appointment 10 days ago as he was going on vacation. He tells me that on Monday he noticed a large area on his posterior left leg which is been draining copiously and is reopened into a large wound. He is been using ABDs and the external part of his juxtalite, according to our nurse this was not on properly. 10/07/16;  Still a substantial area on the posterior left leg. Using silver alginate 10/14/16; in general better although there is still open area which looks healthy. Still using silver alginate. He reminds me that this happen before he left for [facility].  T oday while he was showering in the morning. He had been using his juxtalite's 10/21/16; the area on his posterior left leg is fully epithelialized. However he arrives today with a large area of tender erythema in his medial and posterior left thigh just above the knee. I have marked the area. Once again he is reluctant to consider hospitalization. I treated him with oral antibiotics in the past for a similar situation with resolution I think with doxycycline however this area it seems more extensive to me. He is not complaining of fever but does have chills and says states he is thirsty. His blood sugar today was in the 140s at home 10/25/16 the area on his posterior left leg is fully epithelialized although there is still some weeping edema. The large area of tenderness and erythema in his medial and posterior left thigh is a lot less tender although there is still a lot of swelling in this thigh. He states he feels a lot better. He is on doxycycline and Augmentin that I started last week. This will continued until Tuesday, December 26. I have ordered a duplex ultrasound of the left thigh rule out DVT whether there is an abscess something that would need to be drained I would also like to know. 11/01/16; he still has weeping edema from a not fully epithelialized area on his left posterior calf. Most of the rest of this looks a lot better. He has completed his antibiotics. His thigh is a lot better. Duplex ultrasound did not show a DVT in the thigh 11/08/16; he comes in today with more Denuded surface epithelium from the posterior aspect of his calf. There is no real evidence of cellulitis. The superior aspect of his wrap appears to have put quite an  indentation in his leg just below the knee and this may have contributed. He does not complain of pain or fever. We have been using silver alginate as the primary dressing. The area of cellulitis in the right thigh has totally resolved. He has been using his compression stockings once a week 11/15/16; the patient arrives today with more loss of epithelium from the posterior aspect of his left calf. He now has a fairly substantial wound in this area. The reason behind this deterioration isn't exactly clear although his edema is not well controlled. He states he feels he is generally more swollen systemically. He is not complaining of chest pain shortness of breath fever. T me he has an appointment with his primary physician in early February. He is on 80 mg of oral ells Lasix a day. He claims compliance with the external compression pumps. He is not having any pain in his legs similar to what he has with his recurrent cellulitis 11/22/16; the patient arrives a follow-up of his large area on his left lateral calf. This looks somewhat better today. He came in earlier in the week for a dressing change since I saw him a week ago. He is not complaining of any pain no shortness of breath no chest pain 11/28/16; the patient arrives for follow-up of his large area on the left lateral calf this does not look better. In fact it is larger weeping edema. The surface of the wound does not look too bad. We have been using silver alginate although I'm not certain that this is a dressing issue. 12/05/16; again the patient follows up for a large wound on the left lateral and left posterior calf this does not look better. There continues  to be weeping edema necrotic surface tissue. More worrisome than this once again there is erythema below the wound involving the distal Achilles and heel suggestive of cellulitis. He is on his feet working most of the day of this is not going well. We are changing his dressing twice a week to  facilitate the drainage. 12/12/16; not much change in the overall dimensions of the large area on the left posterior calf. This is very inflamed looking. I gave him an. Doxycycline last week does not really seem to have helped. He found the wrap very painful indeed it seems to of dog into his legs superiorly and perhaps around the heel. He came in early today because the drainage had soaked through his dressings. 12/19/16- patient arrives for follow-up evaluation of his left lower extremity ulcers. He states that he is using his lymphedema pumps once daily when there is "no drainage". He admits to not using his lipedema pumps while under current treatment. His blood sugars have been consistently between 150-200. 12/26/16; the patient is not using his compression pumps at home because of the wetness on his feet. I've advised him that I think it's important for him to use this daily. He finds his feet too wet, he can put a plastic bag over his legs while he is in the pumps. Otherwise I think will be in a vicious circle. We are using silver alginate to the major area on his left posterior calf 01/02/17; the patient's posterior left leg has further of all into 3 open wounds. All of them covered with a necrotic surface. He claims to be using his compression pumps once a day. His edema control is marginal. Continue with silver alginate 01/10/17; the patient's left posterior leg actually looks somewhat better. There is less edema, less erythema. Still has 3 open areas covered with a necrotic surface requiring debridement. He claims to be using his compression pumps once a day his edema control is better 01/17/17; the patient's left posterior calf look better last week when I saw him and his wrap was changed 2 days ago. He has noted increasing pain in the left heel and arrives today with much larger wounds extensive erythema extending down into the entire heel area especially tender medially. He is not  systemically unwell CBGs have been controlled no fever. Our intake nurse showed me limegreen drainage on his AVD pads. 01/24/17; his usual this patient responds nicely to antibiotics last week giving him Levaquin for presumed Pseudomonas. The whole entire posterior part of his leg is much better much less inflamed and in the case of his Achilles heel area much less tender. He has also had some epithelialization posteriorly there are still open areas here and still draining but overall considerably better 01/31/17- He has continue to tolerate the compression wraps. he states that he continues to use the lymphedema pumps daily, and can increase to twice daily on the weekends. He is voicing no complaints or concerns regarding his LLE ulcers 02/07/17-he is here for follow-up evaluation. He states that he noted some erythema to the left medial and anterior thigh, which he states is new as of yesterday. He is concerned about recurrent cellulitis. He states his blood sugars have been slightly elevated, this morning in the 180s 02/14/17; he is here for follow-up evaluation. When he was last here there was erythema superiorly from his posterior wound in his anterior thigh. He was prescribed Levaquin however a culture of the wound surface grew MRSA over the  phone I changed him to doxycycline on Monday and things seem to be a lot better. 02/24/17; patient missed his appointment on Friday therefore we changed his nurse visit into a physician visit today. Still using silver alginate on the large area of the posterior left thigh. He isn't new area on the dorsal left second toe 03/03/17; actually better today although he admits he has not used his external compression pumps in the last 2 days or so because of work responsibilities over the weekend. 03/10/17; continued improvement. External compression pumps once a day almost all of his wounds have closed on the posterior left calf. Better edema control 03/17/17; in general  improved. He still has 3 small open areas on the lateral aspect of his left leg however most of the area on the posterior part of his leg is epithelialized. He has better edema control. He has an ABD pad under his stocking on the right anterior lower leg although he did not let us look at that today. 03/24/17; patient arrives back in clinic today with no open areas however there are areas on the posterior left calf and anterior left calf that are less than 100% epithelialized. His edema is well controlled in the left lower leg. There is some pitting edema probably lymphedema in the left upper thigh. He uses compression pumps at home once per day. I tried to get him to do this twice a day although he is very reticent. 04/01/2017 -- for the last 2 days he's had significant redness, tenderness and weeping and came in for an urgent visit today. 04/07/17; patient still has 6 more days of doxycycline. He was seen by Dr. Meyer Russel last Wednesday for cellulitis involving the posterior aspect, lateral aspect of his Involving his heel. For the most part he is better there is less erythema and less weeping. He has been on his feet for 12 hours 2 over the weekend. Using his compression pumps once a day 04/14/17 arrives today with continued improvement. Only one area on the posterior left calf that is not fully epithelialized. He has intense bilateral venous inflammation associated with his chronic venous insufficiency disease and secondary lymphedema. We have been using silver alginate to the left posterior calf wound In passing he tells [place] today that the right leg but we have not seen in quite some time has an open area on it but he doesn't want [place] to look at this today states he will show this to [place] next week. 04/21/17; there is no open area on his left leg although he still reports some weeping edema. He showed [place] his right leg today which is the first time we've seen this leg in a long time. He has a large area of  open wound on the right leg anteriorly healthy granulation. Quite a bit of swelling in the right leg and some degree of venous inflammation. He told us about the right leg in passing last week but states that deterioration in the right leg really only happened over the weekend 04/28/17; there is no open area on the left leg although there is an irritated part on the posterior which is like a wrap injury. The wound on the right leg which was new from last week at least to [place] is a lot better. 05/05/17; still no open area on the left leg. Patient is using his new compression stocking which seems to be doing a good job of controlling the edema. He states he is using his compression  pumps once per day. The right leg still has an open wound although it is better in terms of surface area. Required debridement. A lot of pain in the posterior right Achilles marked tenderness. Usually this type of presentation this patient gives concern for an active cellulitis 05/12/17; patient arrives today with his major wound from last week on the right lateral leg somewhat better. Still requiring debridement. He was using his compression stocking on the left leg however that is reopened with superficial wounds anteriorly he did not have an open wound on this leg previously. He is still using his juxta light's once daily at night. He cannot find the time to do this in the morning as he has to be at work by 7 AM 05/19/17; right lateral leg wound looks improved. No debridement required. The concerning area is on the left posterior leg which appears to almost have a subcutaneous hemorrhagic component to it. We've been using silver alginate to all the wounds 05/26/17; the right lateral leg wound continues to look improved. However the area on the left posterior calf is a tightly adherent surface. [place] using silver alginate. Because of the weeping edema in his legs there is very little good alternatives. 06/02/17; the patient left  here last week looking quite good. Major wound on the left posterior calf and a small one on the right lateral calf. Both of these look satisfactory. He tells me that by Wednesday he had noted increased pain in the left leg and drainage. He called on Thursday and Friday to get an appointment here but we were blocked. He did not go to urgent care or his primary physician. He thinks he had a fever on Thursday but did not actually take his temperature. He has not been using his compression pumps on the left leg because of pain. I advised him to go to the emergency room today for IV antibiotics for stents of left leg cellulitis but he has refused I have asked him to take 2 days off work to keep his leg elevated and he has refused this as well. In view of this I'm going to call him and Augmentin and doxycycline. 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 However after a detailed discussion regarding the risk benefits and alternatives he refuses to get admitted to the hospital. With no other recourse I will continue him on oral antibiotics as before and hopefully he'll have his infectious disease consultation this week. 06/16/2017 -- the patient was seen today by the nurse practitioner at infectious disease Ms. Dixon. 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 He has also been advise Lotrimin cream locally. she also had wise IV antibiotics for his condition if it flares up 06/23/17; patient arrives today with drainage bilaterally although the remaining wound on the left posterior calf after  cleaning up today "highlighter yellow drainage" did not look too bad. Unfortunately he has had breakdown on the right anterior leg [previously this leg had not been open and he is using a black stocking] he went to see infectious disease  and is been put on clindamycin 150 mg daily, I did not verify the dose although I'm not familiar with using clindamycin in this dosing range, perhaps for prophylaxisoo 06/27/17; I brought this patient back today to follow-up on the wound deterioration on the right lower leg together with surrounding cellulitis. I started him on doxycycline 4 days ago. This area looks better however he comes in today with intense cellulitis on the medial part of his left thigh. This is not have a wound in this area. Extremely tender. We've been using silver alginate to the wounds on the right lower leg left lower leg with bilateral 4 layer compression he is using his external compression pumps once a day 07/04/17; patient's left medial thigh cellulitis looks better. He has not been using his compression pumps as his insert said it was contraindicated with cellulitis. His right leg continues to make improvements all the wounds are still open. We only have one remaining wound on the left posterior calf. Using silver alginate to all open areas. He is on doxycycline which I started a week ago and should be finishing I gave him Augmentin after Thursday's visit for the severe cellulitis on the left medial thigh which fortunately looks better 07/14/17; the patient's left medial thigh cellulitis has resolved. The cellulitis in his right lower calf on the right also looks better. All of his wounds are stable to improved we've been using silver alginate he has completed the antibiotics I have given him. He has clindamycin 150 mg once a day prescribed by infectious disease for prophylaxis, I've advised him to start this now. We have been using bilateral Unna boots over silver alginate to the wound  areas 07/21/17; the patient is been to see infectious disease who noted his recurrent problems with cellulitis. He was not able to tolerate prophylactic clindamycin therefore he is on amoxicillin 500 twice a day. He also had a second daily dose of Lasix added By Dr. Oneta Rack but he is not taking this. Nor is he being completely compliant with his compression pumps a especially not this week. He has 2 remaining wounds one on the right posterior lateral lower leg and one on the left posterior medial lower leg. 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 The patient has Unna boots bilaterally. 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 He has bilateral Unna boots for compression and silver alginate to his wounds. Arrives today with his legs looking as good as I have seen him in quite some time. Not surprisingly his wounds look better as well with improvement on the right lateral leg venous insufficiency wound and also the left medial leg. He is still using the compression pumps once a day 08/11/17; both legs appear to be doing better wounds on the right lateral and left medial legs look better. Skin on the right leg quite good. He is been using silver alginate as the primary dressing. I'm going to use Anasept gel calcium alginate and maintain all the secondary dressings 08/18/17; the patient continues to actually do quite well. The area on his right lateral leg is just about closed the left medial also looks better although it is still moist in this area. His edema is well controlled we have been using Anasept gel with calcium alginate and  the usual secondary dressings, 4 layer compression and once daily use of his compression pumps "always been able to manage 09/01/17; the patient  continues to do reasonably well in spite of his trip to [place]. The area on the right lateral leg is epithelialized. Left is much better but still open. He has more edema and more chronic erythema on the left leg [venous inflammation] 09/08/17; he arrives today with no open wound on the right lateral leg and decently controlled edema. Unfortunately his left leg is not nearly as in his good situation as last week.he apparently had increasing edema starting on Saturday. He edema soaked through into his foot so used a plastic bag to walk around his home. The area on the medial right leg which was his open area is about the same however he has lost surface epithelium on the left lateral which is new and he has significant pain in the Achilles area of the left foot. He is already on amoxicillin chronically for prophylaxis of cellulitis in the left leg 09/15/17; he is completed a week of doxycycline and the cellulitis in the left posterior leg and Achilles area is as usual improved. He still has a lot of edema and fluid soaking through his dressings. There is no open wound on the right leg. He saw infectious disease NP today 09/22/17;As usual 1 we transition him from our compression wraps to his stockings things did not go well. He has several small open areas on the right leg. He states this was caused by the compression wrap on his skin although he did not wear this with the stockings over them. He has several superficial areas on the left leg medially laterally posteriorly. He does not have any evidence of active cellulitis especially involving the left Achilles The patient is traveling from [facility] Saturday going to [facility]. He states he isn't attempting to get an appointment with a heel objects wound center there to change his dressings. I am not completely certain whether this will work 10/06/17; the patient came in on Friday for a nurse visit and the nurse reported that his legs actually look  quite good. He arrives in clinic today for his regular follow-up visit. He has a new wound on his left third toe over the PIP probably caused by friction with his footwear. He has small areas on the left leg and a very superficial but epithelialized area on the right anterior lateral lower leg. Other than that his legs look as good as I've seen him in quite some time. We have been using silver alginate Review of systems; no chest pain no shortness of breath other than this a 10 point review of systems negative 10/20/17; seen by Dr. Meyer Russel last week. He had taken some antibiotics [doxycycline] that he had left over. Dr. Meyer Russel thought he had candida infection and declined to give him further antibiotics. He has a small wound remaining on the right lateral leg several areas on the left leg including a larger area on the left posterior several left medial and anterior and a small wound on the left lateral. The area on the left dorsal third toe looks a lot better. ROS; Gen.; no fever, respiratory no cough no sputum Cardiac no chest pain other than this 10 point review of system is negative 10/30/17; patient arrives today having fallen in the bathtub 3 days ago. It took him a while to get up. He has pain and maceration in the wounds on his left  leg which have deteriorated. He has not been using his pumps he also has some maceration on the right lateral leg. 11/03/17; patient continues to have weeping edema especially in the left leg. This saturates his dressings which were just put on on 12/27. As usual the doxycycline seems to take care of the cellulitis on his lower leg. He is not complaining of fever, chills, or other systemic symptoms. He states his leg feels a lot better on the doxycycline I gave him empirically. He also apparently gets injections at his primary doctor's officeo Rocephin for cellulitis prophylaxis. I didn't ask him about his compression pump compliance today I think that's probably  marginal. Arrives in the clinic with all of his dressings primary and secondary macerated full of fluid and he has bilateral edema 11/10/17; the patient's right leg looks some better although there is still a cluster of wounds on the right lateral. The left leg is inflamed with almost circumferential skin loss medially to laterally although we are still maintaining anteriorly. He does not have overt cellulitis there is a lot of drainage. He is not using compression pumps. We have been using silver alginate to the wound areas, there are not a lot of options here 11/17/17; the patient's right leg continues to be stable although there is still open wounds, better than last week. The inflammation in the left leg is better. Still loss of surface layer epithelium especially posteriorly. There is no overt cellulitis in the amount of edema and his left leg is really quite good, tells me he is using his compression pumps once a day. 11/24/17; patient's right leg has a small superficial wound laterally this continues to improve. The inflammation in the left leg is still improving however we have continuous surface layer epithelial loss posteriorly. There is no overt cellulitis in the amount of edema in both legs is really quite good. He states he is using his compression pumps on the left leg once a day for 5 out of 7 days 12/01/17; very small superficial areas on the right lateral leg continue to improve. Edema control in both legs is better today. He has continued loss of surface epithelialization and left posterior calf although I think this is better. We have been using silver alginate with large number of absorptive secondary dressings 4 layer on the left Unna boot on the right at his request. He tells me he is using his compression pumps once a day 12/08/17; he has no open area on the right leg is edema control is good here. On the left leg however he has marked erythema and tenderness breakdown of skin. He has  what appears to be a wrap injury just distal to the popliteal fossa. 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 There was swelling erythema and breakdown in the right leg. He has much worse skin breakdown in the left leg as well multiple open areas medially and posteriorly as well as laterally. He tells me he has been using his compression pumps but tells me he feels that the drainage out of his leg is worse when he uses a compression pumps. T be fair to him he is been saying this o for a while however I don't  know that I have really been listening to this. I wonder if the compression pumps are working properly 12/22/17;. Once again he arrives with severe erythema, weeping edema from the left greater than right leg. Noncompliance with compression pumps. New this visit he is complaining of pain on the lateral aspect of the right leg and the medial aspect of his right thigh. He apparently saw his cardiologist Dr. [name] who was ordered an echocardiogram area and I think this is a step in the right direction 12/25/17; started his doxycycline Monday night. There is still intense erythema of the right leg especially in the anterior thigh although there is less tenderness. The erythema around the wound on the right lateral calf also is less tender. He still complaining of pain in the left heel. His wounds are about the same right lateral left medial left lateral. Superficial but certainly not close to closure. He denies being systemically unwell no fever chills no abdominal pain no diarrhea 12/29/17; back in follow-up of his extensive right calf and right thigh cellulitis. I added amoxicillin to cover possible doxycycline resistant  strep. This seems to of done the trick he is in much less pain there is much less erythema and swelling. He has his echocardiogram at 11:00 this morning. X-ray of the left heel was also negative. 01/05/18; the patient arrived with his edema under much better control. Now that he is retired he is able to use his compression pumps daily and sometimes twice a day per the patient. He has a wound on the right leg the lateral wound looks better. Area on the left leg also looks a lot better. He has no evidence of cellulitis in his bilateral thighs I had a quick peak at his echocardiogram. He is in normal ejection fraction and normal left ventricular function. He has moderate pulmonary hypertension moderately reduced right ventricular function. One would have to wonder about chronic sleep apnea although he says he doesn't snore. He'll review the echocardiogram with his cardiologist. 01/12/18; the patient arrives with the edema in both legs under exemplary control. He is using his compression pumps daily and sometimes twice daily. His wound on the right lateral leg is just about closed. He still has some weeping areas on the posterior left calf and lateral left calf although everything is just about closed here as well. I have spoken with Aldean Baker who is the patient's nurse practitioner and infectious disease. She was concerned that the patient had not understood that the parenteral penicillin injections he was receiving for cellulitis prophylaxis was actually benefiting him. I don't think the patient actually saw that I would tend to agree we were certainly dealing with less infections although he had a serious one last month. 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 He is healed. He'll be placed in juxtalite compression wraps and increase his lymphedema pumps to twice daily. We will follow up again next week to ensure there are no issues with the new  regiment. 01/20/18-he is here for evaluation of bilateral lower extremity weeping edema. Yesterday he was placed in compression wrap to the right lower extremity and compression stocking to left lower shrubbery. He states he uses lymphedema pumps last night and again this morning and noted a blister to the left lower extremity. On exam he was noted to have drainage to the right lower extremity. He will be placed in Unna boots bilaterally and follow-up next week 01/26/18; patient was actually discharged a week  ago to his own juxta light stockings only to return the next day with bilateral lower extremity weeping edema.he was placed in bilateral Unna boots. He arrives today with pain in the back of his left leg. There is no open area on the right leg however there is a linear/wrap injury on the left leg and weeping edema on the left leg posteriorly. I spoke with infectious disease about 10 days ago. They were disappointed that the patient elected to discontinue prophylactic intramuscular penicillin shots as they felt it was particularly beneficial in reducing the frequency of his cellulitis. I discussed this with the patient today. He does not share this view. He'll definitely need antibiotics today. Finally [place] and returning a week later and he does not travel with his pumps. He is going by car 01/30/18; patient was seen 4 days ago and brought back in today for review of cellulitis in the left leg posteriorly. I put him on amoxicillin this really hasn't helped as much as I might like. He is also worried because [place]. Finally we will be rewrapping him. There is no open area on the right leg over his left leg has multiple weeping areas as usual 02/09/18; The same wrap on for 10 days. He did not pick up the last doxycycline I prescribed for him. He apparently took 4 days worth he already had. There is nothing open on  his right leg and the edema control is really quite good. He's had damage in the left leg medially and laterally especially probably related to the prolonged use of Unna boots 02/12/18; the patient arrived in clinic today for a nurse visit/wrap change. He complained of a lot of pain in the left posterior calf. He is taking doxycycline that I previously prescribed for him. Unfortunately even though he used his stockings and apparently used to compression pumps twice a day he has weeping edema coming out of the lateral part of his right leg. This is coming from the lower anterior lateral skin area. 02/16/18; the patient has finished his doxycycline and will finish the amoxicillin 2 days. The area of cellulitis in the left calf posteriorly has resolved. He is no longer having any pain. He tells me he is using his compression pumps at least once a day sometimes twice. 02/23/18; the patient finished his doxycycline and Amoxil last week. On Friday he noticed a small erythematous circle about the size of a quarter on the left lower leg just above his ankle. This rapidly expanded and he now has erythema on the lateral and posterior part of the thigh. This is bright red. Also has an area on the dorsal foot just above his toes and a tender area just below the left popliteal fossa. He came off his prophylactic penicillin injections at his own insistence one or 2 months ago. This is obviously deteriorated since then 03/02/18; patient is on doxycycline and Amoxil. Culture I did last week of the weeping area on the back of his left calf grew group B strep. I have therefore renewed the amoxicillin 500 3 times a day for a further week. He has not been systemically unwell. Still complaining of an area of discomfort right under his left popliteal fossa. There is no open wound on the right leg. He tells me that he is using his pumps twice a day on most days 03/09/18; patient arrives in clinic today completing his  amoxicillin  today. The cellulitis on his left leg is better. Furthermore he tells me that he had intramuscular penicillin shots that his primary care office today. However he also states that the wrap on his right leg fell down shortly after leaving clinic last week. He developed a large blister that was present when he came in for a nurse visit later in the week and then he developed intense discomfort around this area.He tells me he is using his compression pumps 03/16/18; the patient has completed his doxycycline. The infectious part of this/cellulitis in the left heel area left popliteal area is a lot better. He has 2 open areas on the right calf. Still areas on the left calf but this is a lot better as well. 03/24/18; the patient arrives complaining of pain in the left popliteal area again. He thinks some of this is wrap injury. He has no open area on the right leg and really no open area on the left calf either except for the popliteal area. He claims to be compliant with the compression pumps 03/31/18; I gave him doxycycline last week because of cellulitis in the left popliteal area. This is a lot better although the surface epithelium is denuded off and response to this. He arrives today with uncontrolled edema in the right calf area as well as a fingernail injury in the right lateral calf. There is only a few open areas on the left 04/06/18; I gave him amoxicillin doxycycline over the last 2 weeks that the amoxicillin should be completing currently. He is not complaining of any pain or systemic symptoms. The only open areas see has is on the right lateral lower leg paradoxically I cannot see anything on the left lower leg. He tells me he is using his compression pumps twice a day on most days. Silver alginate to the wounds that are open under 4 layer compression 04/13/18; he completed antibiotics and has no new complaints. Using his compression pumps. Silver alginate that anything that's opened 04/20/18; he is  using his compression pumps religiously. Silver alginate 4 layer compression anything that's opened. He comes in today with no open wounds on the left leg but 3 on the right including a new one posteriorly. He has 2 on the right lateral and one on the right posterior. He likes Unna boots on the right leg for reasons that aren't really clear we had the usual 4 layer compression on the left. It may be necessary to move to the 4 layer compression on the right however for now I left them in the Unna boots 04/27/18; he is using his compression pumps at least once a day. He has still the wounds on the right lateral calf. The area right posteriorly has closed. He does not have an open wound on the left under 4 layer compression however on the dorsal left foot just proximal to the toes and the left third toe 2 small open areas were identified 05/11/18; he has not uses compression pumps. The areas on the right lateral calf have coalesced into one large wound necrotic surface. On the left side he has one small wound anteriorly however the edema is now weeping out of a large part of his left leg. He says he wasn't using his pumps because of the weeping fluid. I explained to him that this is the time he needs to pump more 05/18/18; patient states he is using his compression pumps twice a day. The area on the right lateral large  wound albeit superficial. On the left side he has innumerable number of small new wounds on the left calf particularly laterally but several anteriorly and medially. All these appear to have healthy granulated base these look like the remnants of blisters however they occurred under compression. The patient arrives in clinic today with his legs somewhat better. There is certainly less edema, less multiple open areas on the left calf and the right anterior leg looks somewhat better as well superficial and a little smaller. However he relates pain and erythema over the last 3-4 days in the thigh  and I looked at this today. He has not been systemically unwell no fever no chills no change in blood sugar values 05/25/18; comes in today in a better state. The severe cellulitis on his left leg seems better with the Keflex. Not as tender. He has not been systemically unwell Hard to find an open wound on the left lower leg using his compression pumps twice a day The confluent wounds on his right lateral calf somewhat better looking. These will ultimately need debridement I didn't do this today. 06/01/18; the severe cellulitis on the left anterior thigh has resolved and he is completed his Keflex. There is no open wound on the left leg however there is a superficial excoriation at the base of the third toe dorsally. Skin on the bottom of his left foot is macerated looking. The left the wounds on the lateral right leg actually looks some better although he did require debridement of the top half of this wound area with an open curet 06/09/18 on evaluation today patient appears to be doing poorly in regard to his right lower extremity in particular this appears to likely be infected he has very thick purulent discharge along with a bright green tent to the discharge. This makes me concerned about the possibility of pseudomonas. He's also having increased discomfort at this point on evaluation. Fortunately there does not appear to be any evidence of infection spreading to the other location at this time. 06/16/18 on evaluation today patient appears to actually be doing fairly well. His ulcer has actually diminished in size quite significantly at this point which is good news. Nonetheless he still does have some evidence of infection he did see infectious disease this morning before coming here for his appointment. I did review the results of their evaluation and their note today. They did actually have him discontinue the Cipro and initiate treatment with linezolid at this time. He is doing this for the  next seven days and they recommended a follow-up in four months with them. He is the keep a log of the need for intermittent antibiotic therapy between now and when he falls back up with infectious disease. This will help them gaze what exactly they need to do to try and help them out. 06/23/18; the patient arrives today with no open wounds on the left leg and left third toe healed. He is been using his compression pumps twice a day. On the right lateral leg he still has a sizable wound but this is a lot better than last time I saw this. 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 Has been using silver alginate under 4 layer compression 06/30/18; the only open wound he has is on the right lateral leg and this looks healthy. No debridement is required. We have been using silver alginate.  He does not have an open wound on the left leg. There is apparently some drainage from the dorsal proximal third toe on the left although I see no open wound here. 07/03/18 on evaluation today patient was actually here just for a nurse visit rapid change. However when he was here on Wednesday for his rat change due to having been healed on the left and then developing blisters we initiated the wrap again knowing that he would be back today for [place] to reevaluate and see were at. Unfortunately he has developed some cellulitis into the proximal portion of his right lower extremity even into the region of his thigh. 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 He was placed on one as what at that point. Nonetheless he is done with that and has been tolerating it well otherwise. Doxycycline which in the past really did not seem to be effective for him. Nonetheless I think the best option may be for [place] to definitely reinitiate the antibiotics for a longer period of time. 07/07/18; since I  last saw this patient a week ago he has had a difficult time. At that point he did not have an open wound on his left leg. We transitioned him into juxta light stockings. He was apparently in the clinic the next day with blisters on the left lateral and left medial lower calf. He also had weeping edema fluid. He was put back into a compression wrap. He was also in the clinic on Friday with intense erythema in his right thigh. Per the patient he was started on Bactrim however that didn't work at all in terms of relieving his pain and swelling. He has taken 3 doxycycline that he had left over from last time and that seems to of helped. He has blistering on the right thigh as well. 07/14/18; the erythema on his right thigh has gotten better with doxycycline that he is finishing. 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 Presumably this cellulitis in the thigh was not related to that although I think this is a bit concerning going forward. He still has an area on the right lateral calf the blister on the right medial calf just below the knee that was discussed above. On the left 2 small open areas left medial and left lateral. Edema control is adequate. He is using his compression pumps twice a day 07/20/18; continued improvement in the condition of both legs especially the edema in his bilateral thighs. He tells me he is been losing weight through a combination of diet and exercise. He is using his compression pumps twice a day. So overall she made to the remaining wounds 07/27/2018; continued improvement in condition of both legs. His edema is well controlled. The area on the right lateral leg is just about closed he had one blisters show up on the medial left upper calf. We have him in 4 layer compression. He is going on a 10-day trip to [place]. He will be driving. He wants to wear Unna boots because of  the lessening amount of constriction. He will not use compression pumps while he is away 08/05/18 on evaluation today patient actually appears to be doing decently well all things considered in regard to his bilateral lower extremities. The worst ulcer is actually only posterior aspect of his left lower extremity  with a four layer compression wrap cut into his leg a couple weeks back. He did have a trip and actually had [employer] for the trip that he is worn since he was last here. Nonetheless he feels like the [employer] actually do better for him his swelling is up a little bit but he also with his trip was not taking his Lasix on a regular set schedule like he was supposed to be. He states that obviously the reason being that he cannot drive and keep going without having to urinate too frequently which makes it difficult. He did not have his pumps with him while he was away either which I think also maybe playing a role here too. 08/13/2018; the patient only has a small open wound on the right lateral calf which is a big improvement in the last month or 2. He also has the area posteriorly just below the posterior fossa on the left which I think was a wrap injury from several weeks ago. He has no current evidence of cellulitis. He tells me he is back into his compression pumps twice a day. He also tells me that while he was at the laundromat somebody stole a section of his extremitease stockings 08/20/2018; back in the clinic with a much improved state. He only has small areas on the right lateral mid calf which is just about healed. This was is more substantial area for quite a prolonged period of time. He has a small open area on the left anterior tibia. The area on the posterior calf just below the popliteal fossa is closed today. He is using his compression pumps twice a day 08/28/2018; patient has no open wound on the right leg. He has a smattering of open areas on the calf with some  weeping lymphedema. More problematically than that it looks as though his wraps of slipped down in his usual he has very angry upper area of edema just below the right medial knee and on the right lateral calf. He has no open area on his feet. The patient is traveling to [facility] next week. I will send him in an antibiotic. We will continue to wrap the right leg. We ordered extremitease stockings for him last week and I plan to transition the right leg to a stocking when he gets home which will be in 10 days time. As usual he is very reluctant to take his pumps with him when he travels 09/07/2018; patient returns from [facility]. He shows me a picture of his left leg in the mid part of his trip last week with intense fire engine erythema. The picture look bad enough I would have considered sending him to the hospital. Instead he went to the wound care center in [facility]. They did not prescribe him antibiotics but he did take some doxycycline he had leftover from a previous visit. I had given him trimethoprim sulfamethoxazole before he left this did not work according to the patient. This is resulted in some improvement fortunately. He comes back with a large wound on the left posterior calf. Smaller area on the left anterior tibia. Denuded blisters on the dorsal left foot over his toes. Does not have much in the way of wounds on the right leg although he does have a very tender area on the right posterior area just below the popliteal fossa also suggestive of infection. He promises me he is back on his pumps twice a day  09/15/2018; the intense cellulitis in his left lower calf is a lot better. The wound area on the posterior left calf is also so better. However he has reasonably extensive wounds on the dorsal aspect of his second and third toes and the proximal foot just at the base of the toes. There is nothing open on the right leg 09/22/2018; the patient has excellent edema  control in his legs bilaterally. He is using his external compression pumps twice a day. He has no open area on the right leg and only the areas in the left foot dorsally second and third toe area on the left side. He does not have any signs of active cellulitis. 10/06/2018; the patient has good edema control bilaterally. He has no open wound on the right leg. There is a blister in the posterior aspect of his left calf that we had to deal with today. He is using his compression pumps twice a day. There is no signs of active cellulitis. We have been using silver alginate to the wound areas. He still has vulnerable areas on the base of his left first second toes dorsally He has a his extremities stockings and we are going to transition him today into the stocking on the right leg. He is cautioned that he will need to continue to use the compression pumps twice a day. If he notices uncontrolled edema in the right leg he may need to go to 3 times a day. 10/13/2018; the patient came in for a nurse check on Friday he has a large flaccid blister on the right medial calf just below the knee. We unroofed this. He has this and a new area underneath the posterior mid calf which was undoubtedly a blister as well. He also has several small areas on the right which is the area we put his extremities stocking on. 10/19/2018; the patient went to see infectious disease this morning I am not sure if that was a routine follow-up in any case the doxycycline I had given him was discontinued and started on linezolid. He has not started this. It is easy to look at his left calf and the inflammation and think this is cellulitis however he is very tender in the tissue just below the popliteal fossa and I have no doubt that there is infection going on here. He states the problem he is having is that with the compression pumps the edema goes down and then starts walking the wrap falls down. We will see if we can adhere this. He  has 1 or 2 minuscule open areas on the right still areas that are weeping on the posterior left calf, the base of his left second and third toes 10/26/18; back today in clinic with quite of skin breakdown in his left anterior leg. This may have been infection the area below the popliteal fossa seems a lot better however tremendous epithelial loss on the left anterior mid tibia area over quite inexpensive tissue. He has 2 blisters on the right side but no other open wound here. 10/29/2018; came in urgently to see [place] today and we worked him in for review. He states that the 4 layer compression on the right leg caused pain he had to cut it down to roughly his mid calf this caused swelling above the wrap and he has blisters and skin breakdown today. As a result of the pain he has not been using his pumps. Both legs are a lot more edematous and there is  a lot of weeping fluid. 11/02/18; arrives in clinic with continued difficulties in the right leg> left. Leg is swollen and painful. multiple skin blisters and new open areas especially laterally. He has not been using his pumps on the right leg. 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 He is not systemically unwell. 11/09/2018; the patient claims he is being compliant with his pumps. He is finished the doxycycline I gave him last week. Culture I did of the wound on the right lateral leg showed a few very resistant methicillin staph aureus. This was resistant to doxycycline. Nevertheless he states the pain in the leg is a lot better which makes me wonder if the cultured organism was not really what was causing the problem nevertheless this is a very dangerous organism to be culturing out of any wound. His right leg is still a lot larger than the left. He is using an Radio broadcast assistant on this area, he blames a 4-layer compression for causing the original skin breakdown which I doubt is true however I cannot talk him out  of it. We have been using silver alginate to all of these areas which were initially blisters 11/16/2018; patient is being compliant with his external compression pumps at twice a day. Miraculously he arrives in clinic today with absolutely no open wounds. He has better edema control on the left where he has been using 4 layer compression versus wound of wounds on the right and I pointed this out to him. There is no inflammation in the skin in his lower legs which is also somewhat unusual for him. There is no open wounds on the dorsal left foot. He has extremitease stockings at home and I have asked him to bring these in next week. 11/25/18 patient's lower extremity on examination today on the left appears for the most part to be wound free. He does have an open wound on the lateral aspect of the right lower extremity but this is minimal compared to what I've seen in past. He does request that we go ahead and wrap the left leg as well even though there's nothing open just so hopefully it will not reopen in short order. 1/28; patient has superficial open wounds on the right lateral calf left anterior calf and left posterior calf. His edema control is adequate. 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 We have been using silver alginate as the primary dressing. He claims compliance with his compression pumps 2/4; patient has superficial open wounds on numerous areas of his left calf and again one on the left dorsal foot. The areas on the right lateral calf have healed. The cellulitis that I gave him doxycycline for last week is also resolved this was mostly on the left anterior calf just below the tibial tuberosity. His edema looks fairly well-controlled. He tells me he went to see his primary doctor today and had blood work ordered 2/11; once again he has several open areas on the left calf left tibial area. Most of  these are small and appear to have healthy granulation. He does not have anything open on the right. The edema and control in his thighs is pretty good which is usually a good indication he has been using his pumps as requested. 2/18; he continues to have several small areas on the left calf and left tibial area. Most of these  are small healthy granulation. We put him in his stocking on the right leg last week and he arrives with a superficial open area over the right upper tibia and a fairly large area on the right lateral tibia in similar condition. His edema control actually does not look too bad, he claims to be using his compression pumps twice a day 2/25. Continued small areas on the left calf and left tibial area. New areas especially on the right are identified just below the tibial tuberosity and on the right upper tibia itself. There are also areas of weeping edema fluid even without an obvious wound. He does not have a considerable degree of lymphedema but clearly there is more edema here than his skin can handle. He states he is using the pumps twice a day. We have an Unna boot on the right and 4 layer compression on the left. 3/3; he continues to have an area on the right lateral calf and right posterior calf just below the popliteal fossa. There is a fair amount of tenderness around the wound on the popliteal fossa but I did not see any evidence of cellulitis, could just be that the wrap came down and rubbed in this area. He does not have an open area on the left leg however there is an area on the left dorsal foot at the base of the third toe We have been using silver alginate to all wound areas 3/10; he did not have an open area on his left leg last time he was here a week ago. T oday he arrives with a horizontal wound just below the tibial tuberosity and an area on the left lateral calf. He has intense erythema and tenderness in this area. The area is on the right lateral calf and  right posterior calf better than last week. We have been using silver alginate as usual 3/18 - Patient returns with 3 small open areas on left calf, and 1 small open area on right calf, the skin looks ok with no significant erythema, he continues the UNA boot on right and 4 layer compression on left. The right lateral calf wound is closed , the right posterior is small area. we will continue silver alginate to the areas. 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 He is been tolerating the dressing changes without complication. Fortunately she has made excellent improvement in regard to the overall status of his wounds. Unfortunately every time we cease wrapping him he ends up reopening in causing more significant issues at that point. Again I'm unsure of the best direction to take although I think the lymphedema clinic may be appropriate for him. 02/03/19 on evaluation today patient appears to be doing well in regard to the wounds that we saw him for last week unfortunately he has a new area on the proximal portion of his right medial/posterior lower extremity where the wrap somewhat slowed down and caused swelling and a blister to rub and open. Unfortunately this is the only opening that he has on either leg at this point. 02/17/19 on evaluation today patient's bilateral lower extremities appear to be doing well. He still completely healed in regard to the left lower extremity. In regard to the right lower extremity the area where the wrap and slid down and caused the blister  still seems to be slightly open although this is dramatically better than during the last evaluation two weeks ago. I'm very pleased with the way this stands overall. 03/03/19 on evaluation today patient appears to be doing well in regard  to his right lower extremity in general although he did have a new blister open this does not appear to be showing any evidence of active infection at this time. Fortunately there's No fevers, chills, nausea, or vomiting noted at this time. Overall I feel like he is making good progress it does feel like that the right leg will we perform the [employer] seems to do with a bit better than three layer wrap on the left which slid down on him. We may switch to doing bilateral in the book wraps. 5/4; I have not seen Mr. Pareja in quite some time. According to our case manager he did not have an open wound on his left leg last week. He had 1 remaining wound on the right posterior medial calf. He arrives today with multiple openings on the left leg probably were blisters and/or wrap injuries from Unna boots. I do not think the Unna boot's will provide adequate compression on the left. I am also not clear about the frequency he is using the compression pumps. 03/17/19 on evaluation today patient appears to be doing excellent in regard to his lower extremities compared to last week's evaluation apparently. He had gotten significantly worse last week which is unfortunate. The [employer] wrap on the left did not seem to do very well for him at all and in fact it didn't control his swelling significantly enough he had an additional outbreak. Subsequently we go back to the four layer compression wrap on the left. This is good news. At least in that he is doing better and the wound seem to be killing him. He still has not heard anything from the lymphedema clinic. 03/24/19 on evaluation today patient actually appears to be doing much better in regard to his bilateral lower Trinity as compared to last week when I saw him. Fortunately there's no signs of active infection at this time. He has been tolerating the dressing changes without complication. Overall I'm extremely pleased with the progress and appearance in  general. 04/07/19 on evaluation today patient appears to be doing well in regard to his bilateral lower extremities. His swelling is significantly down from where it was previous. With that being said he does have a couple blisters still open at this point but fortunately nothing that seems to be too severe and again the majority of the larger openings has healed at this time. 04/14/19 on evaluation today patient actually appears to be doing quite well in regard to his bilateral lower extremities in fact I'm not even sure there's anything significantly open at this time at any site. Nonetheless he did have some trouble with these wraps where they are somewhat irritating him secondary to the fact that he has noted that the graph wasn't too close down to the end of this foot in a little bit short as well up to his knee. Otherwise things seem to be doing quite well. 04/21/19 upon evaluation today patient's wound bed actually showed evidence of being completely healed in regard to both lower extremities which is excellent news. There does not appear to be any signs of active infection which is also good news. I'm very pleased in this regard. No fevers, chills, nausea, or vomiting noted at this time. 04/28/19  on evaluation today patient appears to be doing a little bit worse in regard to both lower extremities on the left mainly due to the fact that when he went infection disease the wrap was not wrapped quite high enough he developed a blister above this. On the right he is a small open area of nothing too significant but again this is continuing to give him some trouble he has been were in the Velcro compression that he has at home. 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 He's been tolerating the dressing changes without complication. Fortunately there's no signs of active infection at this time. No fevers, chills, nausea, or vomiting noted at  this time. We have been trying to get an appointment with her lymphedema clinic in [facility] but unfortunately nobody can get them on phone with not been able to even fax information over the patient likewise is not been able to get in touch with them. Overall I'm not sure exactly what's going on here with to reach out again today. 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 Still having a lot of drainage unfortunately. He tells me especially in the left but even on the right. There's no signs of active infection which is good news we've been using so ratcheted up to this point. 05/19/19 on evaluation today patient actually appears to be doing quite well with regard to his left lower extremity which is great news. Fortunately in regard to the right lower extremity has an issues with his wrap and he subsequently did remove this from what I'm understanding. Nonetheless long story short is what he had rewrapped once he removed it subsequently had maggots underneath this wrap whenever he came in for evaluation today. With that being said they were obviously completely cleaned away by the nursing staff. The visit today which is excellent news. However he does appear to potentially have some infection around the right ankle region where the maggots were located as well. He will likely require anabiotic therapy today. 05/26/19 on evaluation today patient actually appears to be doing much better in regard to his bilateral lower extremities. I feel like the infection is under much better control. With that being said there were maggots noted when the wrap was removed yet again today. Again this could have potentially been left over from previous although at this time there does not appear to be any signs of significant drainage there was obviously on the wrap some drainage as well this contracted gnats or  otherwise. Either way I do not see anything that appears to be doing worse in my pinion and in fact I think his drainage has slowed down quite significantly likely mainly due to the fact to his infection being under better control. 06/02/2019 on evaluation today patient actually appears to be doing well with regard to his bilateral lower extremities there is no signs of active infection at this time which is great news. With that being said he does have several open areas more so on the right than the left but nonetheless these are all significantly better than previously noted. 06/09/2019 on evaluation today patient actually appears to be doing well. His wrap stayed up and he did not cause any problems he had more drainage on the right compared to the left but overall I do not see any major issues at  this time which is great news. 06/16/2019 on evaluation today patient appears to be doing excellent with regard to his lower extremities the only area that is open is a new blister that can have opened as of today on the medial ankle on the left. Other than this he really seems to be doing great I see no major issues at this point. 06/23/2019 on evaluation today patient appears to be doing quite well with regard to his bilateral lower extremities. In fact he actually appears to be almost completely healed there is a small area of weeping noted of the right lower extremity just above the ankle. Nonetheless fortunately there is no signs of active infection at this time which is good news. No fevers, chills, nausea, vomiting, or diarrhea. 8/24; the patient arrived for a nurse visit today but complained of very significant pain in the left leg and therefore I was asked to look at this. Noted that he did not have an open area on the left leg last week nevertheless this was wrapped. The patient states that he is not been able to put his compression pumps on the left leg because of the discomfort. He has not been  systemically unwell 06/30/2019 on evaluation today patient unfortunately despite being excellent last week is doing much worse with regard to his left lower extremity today. In fact he had to come in for a nurse on Monday where his left leg had to be rewrapped due to excessive weeping Dr. Leanord Hawking placed him on doxycycline at that point. Fortunately there is no signs of active infection Systemically at this time which is good news. 07/07/2019 in regard to the patient's wounds today he actually seems to be doing well with his right lower extremity there really is nothing open or draining at this point this is great news. Unfortunately the left lower extremity is given him additional trouble at this time. There does not appear to be any signs of active infection nonetheless he does have a lot of edema and swelling noted at this point as well as blistering all of which has led to a much more poor appearing leg at this time compared to where it was 2 weeks ago when it was almost completely healed. Obviously this is a little discouraging for the patient. He is try to contact the lymphedema clinic in [place] he has not been able to get through to them. 07/14/2019 on evaluation today patient actually appears to be doing slightly better with regard to his left lower extremity ulcers. Overall I do feel like at least at the top of the wrap that we have been placing this area has healed quite nicely and looks much better. The remainder of the leg is showing signs of improvement. Unfortunately in the thigh area he still has an open region on the left and again on the right he has been utilizing just a Band-Aid on an area that also opened on the thigh. Again this is an area that were not able to wrap although we did do an Ace wrap to provide some compression that something that obviously is a little less effective than the compression wraps we have been using on the lower portion of the leg. He does have an  appointment with the lymphedema clinic in [facility] on Friday. 07/21/2019 on evaluation today patient appears to be doing better with regard to his lower extremity ulcers. He has been tolerating the dressing changes without complication. Fortunately there is no signs of active  infection at this time. No fevers, chills, nausea, vomiting, or diarrhea. I did receive the paperwork from the physical therapist at the lymphedema clinic in [place]. Subsequently I signed off on that this morning and sent that back to him for further progression with the treatment plan. 07/28/2019 on evaluation today patient appears to be doing very well with regard to his right lower extremity where I do not see any open wounds at this point. Fortunately he is feeling great as far as that is concerned as well. In regard to the left lower extremity he has been having issues with still several areas of weeping and edema although the upper leg is doing better his lower leg still I think is going require the compression wrap at this time. No fevers, chills, nausea, vomiting, or diarrhea. 08/04/2019 on evaluation today patient unfortunately is having new wounds on the right lower extremity. Again we have been using Unna boot wrap on that side. We switched him to using his juxta lite wrap at home. With that being said he tells me he has been using it although his legs extremely swollen and to be honest really does not appear that he has been. I cannot know that for sure however. Nonetheless he has multiple new wounds on the right lower extremity at this time. Obviously we will have to see about getting this rewrapped for him today. 08/11/2019 on evaluation today patient appears to be doing fairly well with regard to his wounds. He has been tolerating the dressing changes including the compression wraps without complication. He still has a lot of edema in his upper thigh regions bilaterally he is supposed to be seeing the  lymphedema clinic on the 15th of this month once his wraps arrive for the upper part of his legs. 08/18/2019 on evaluation today patient appears to be doing well with regard to his bilateral lower extremities at this point. He has been tolerating the dressing changes without complication. Fortunately there is no signs of active infection which is also good news. He does have a couple weeping areas on the first and second toe of the right foot he also has just a small area on the left foot upper leg and a small area on the left lower leg but overall he is doing quite well in my opinion. He is supposed to be getting his wraps shortly in fact tomorrow and then subsequently is seeing the lymphedema clinic next Wednesday on the 21st. Of note he is also leaving on the 25th to go on vacation for a [place] to the beach. For that reason and since there is some uncertainty about what there can be doing at lymphedema clinic next Wednesday I am get a make an appointment for next Friday here for [place] to see what we need to do for him prior to him leaving for vacation. 10/23; patient arrives in considerable pain predominantly in the upper posterior calf just distal to the popliteal fossa also in the wound anteriorly above the major wound. This is probably cellulitis and he has had this recurrently in the past. He has no open wound on the right side and he has had an Radio broadcast assistant in that area. Finally I note that he has an area on the left posterior calf which by enlarge is mostly epithelialized. This protrudes beyond the borders of the surrounding skin in the setting of dry scaly skin and lymphedema. The patient is leaving for [facility] on Sunday. Per his longstanding pattern, he  will not take his compression pumps with him predominantly out of fear that they will be stolen. He therefore asked that we put a Unna boot back on the right leg. He will also [place] if [place] can  change his dressing in the mid week. 11/3; [place] He was seen on 1 occasion at their wound care center. They did a 2 layer compression system as they did not have our 4-layer wrap. I am not completely certain what they put on the wounds. They did not change the Unna boot on the right. The patient is also seeing a lymphedema specialist [place]. It appears that he has some compression sleeve for his thighs which indeed look quite a bit better than I am used to seeing. He pumps over these with his external compression pumps. 11/10; the patient has a new wound on the right medial thigh otherwise there is no open areas on the right. He has an area on the left leg posteriorly anteriorly and medially and an area over the left second toe. We have been using silver alginate. He thinks the injury on his thigh is secondary to friction from the compression sleeve he has. 11/17; the patient has a new wound on the right medial thigh last week. He thinks this is because he did not have a underlying stocking for his thigh juxta lite apparatus. He now has this. The area is fairly large and somewhat angry but I do not think he has underlying cellulitis. He has a intact blister on the right anterior tibial area. Small wound on the right great toe dorsally Small area on the medial left calf. 11/30; the patient does not have any open areas on his right leg and we did not take his juxta lite stocking off. However he states that on Friday his compression wrap fell down lodging around his upper mid calf area. As usual this creates a lot of problems for him. He called urgently today to be seen for a nurse visit however the nurse visit turned into a provider visit because of extreme erythema and pain in the left anterior tibia extending laterally and posteriorly. The area that is problematic is extensive 10/06/2019 upon evaluation today patient actually  appears to be doing poorly in regard to his left lower extremity. He Dr. Leanord Hawkingobson did place him on doxycycline this past Monday apparently due to the fact that he was doing much worse in regard to this left leg. Fortunately the doxycycline does seem to be helping. Unfortunately we are still having a very difficult time getting his edema under any type of control in order to anticipate discharge at some point. The only way were really able to control his lymphedema really is with compression wraps and that has only even seemingly temporary. He has been seeing a lymphedema clinic they are trying to help in this regard but still this has been somewhat frustrating in general for the patient. 10/13/19 on evaluation today patient appears to be doing excellent with regard to his right lower extremity as far as the wounds are concerned. His swelling is still quite extensive unfortunately. He is still having a lot of drainage from the thigh areas bilaterally which is unfortunate. He's been going to lymphedema clinic but again he still really does not have this edema under control as far as his lower extremities are concern. With regard to his left lower  extremity this seems to be improving and I do believe the doxycycline has been of benefit for him. He is about to complete the doxycycline. 10/20/2019 on evaluation today patient appears to be doing poorly in regard to his bilateral lower extremities. More in the right thigh he has a lot of irritation at this site unfortunately. In regard to the left lower extremity the wrap was not quite as high it appears and does seem to have caused him some trouble as well. Fortunately there is no evidence of systemic infection though he does have some blue-green drainage which has me concerned for the possibility of Pseudomonas. He tells me he is previously taking Cipro without complications and he really does not care for Levaquin however due to some of the side effects he  has. He is not allergic to any medications specifically antibiotics that were aware of. 10/27/2019 on evaluation today patient actually does appear to be for the most part doing better when compared to last week's evaluation. With that being said he still has multiple open wounds over the bilateral lower extremities. He actually forgot to start taking the Cipro and states that he still has the whole bottle. He does have several new blisters on left lower extremity today I think I would recommend he go ahead and take the Cipro based on what I am seeing at this point. 12/30-Patient comes at 1 week visit, 4 layer compression wraps on the left and Unna boot on the right, primary dressing Xtrasorb and silver alginate. Patient is taking his Cipro and has a few more days left probably 5-6, and the legs are doing better. He states he is using his compressions devices which I believe he has 11/10/2019 on evaluation today patient actually appears to be much better than last time I saw him 2 weeks ago. His wounds are significantly improved and overall I am very pleased in this regard. Fortunately there is no signs of active infection at this time. He is just a couple of days away from completing Cipro. Overall his edema is much better he has been using his lymphedema pumps which I think is also helping at this point. 11/17/2019 on evaluation today patient appears to be doing excellent in regard to his wounds in general. His legs are swollen but not nearly as much as they have been in the past. Fortunately he is tolerating the compression wraps without complication. No fevers, chills, nausea, vomiting, or diarrhea. He does have some erythema however in the distal portion of his right lower extremity specifically around the forefoot and toes there is a little bit of warmth here as well. 11/24/2019 on evaluation today patient appears to be doing well with regard to his right lower extremity I really do not see any open  wounds at this point. His left lower extremity does have several open areas and his right medial thigh also is open. Other than this however overall the patient seems to be making good progress and I am very pleased at this point. 12/01/2019 on evaluation today patient appears to be doing poorly at this point in regard to his left lower extremity has several new blisters despite the fact that we have him in compression wraps. In fact he had a 4-layer compression wrap, his upper thigh wrapped from lymphedema clinic, and a juxta light over top of the 4 layer compression wrap the lymphedema clinic applied and despite all this he still develop blisters underneath. Obviously this does have me concerned about  the fact that unfortunately despite what we are doing to try to get wounds healed he continues to have new areas arise I do not think he is ever good to be at the point where he can realistically just use wraps at home to keep things under control. Typically when we heal him it takes about 1-2 days before he is back in the clinic with severe breakdown and blistering of his lower extremities bilaterally. This is happened numerous times in the past. Unfortunately I think that we may need some help as far as overall fluid overload to kind of limit what we are seeing and get things under better control. 12/08/2019 on evaluation today patient presents for follow-up concerning his ongoing bilateral lower extremity edema. Unfortunately he is still having quite a bit of swelling the compression wraps are controlling this to some degree but he did see Dr. [name] his cardiologist I do have that available for review today as far as the appointment was concerned that was on 12/06/2019. Obviously that she has been 2 days ago. The patient states that he is only been taking the Lasix 80 mg 1 time a day he had told me previously he was taking this twice a day. Nonetheless Dr. [name] recommended this be up to 80 mg 2 times a  day for the patient as he did appear to be fluid overloaded. With that being said the patient states he did this yesterday and he was unable to go anywhere or do anything due to the fact that he was constantly having to urinate. Nonetheless I think that this is still good to be something that is important for him as far as trying to get his edema under control at all things that he is going to be able to just expect his wounds to get under control and things to be better without going through at least a period of time where he is trying to stabilize his fluid management in general and I think increasing the Lasix is likely the first step here. It was also mentioned the possibility that the patient may require metolazone. With that being said he wanted to have the patient take Lasix twice a day first and then reevaluating 2 months to see where things stand. 12/15/2019 upon evaluation today patient appears to be doing regard to his legs although his toes are showing some signs of weeping especially on the left at this point to some degree on the right. There does not appear to be any signs of active infection and overall I do feel like the compression wraps are doing well for him but he has not been able to take the Lasix at home and the increased dose that Dr. [name] recommended. He tells me that just not go to be feasible for him. Nonetheless I think in this case he should probably send a message to Dr. [name] in order to discuss options from the standpoint of possible admission to get the fluid off or otherwise going forward. 12/22/2019 upon evaluation today patient appears to be doing fairly well with regard to his lower extremities at this point. In fact he would be doing excellent if it was not for the fact that his right anterior thigh apparently had an allergic reaction to adhesive tape that he used. The wound itself that we have been monitoring actually appears to be healed. There is a lot of  irritation at this point. 12/29/2019 upon evaluation today patient appears to be doing well in  regard to his lower extremities. His left medial thigh is open and somewhat draining today but this is the only region that is open the right has done much better with the treatment utilizing the steroid cream that I prescribed for him last week. Overall I am pleased in that regard. Fortunately there is no signs of active infection at this time. No fevers, chills, nausea, vomiting, or diarrhea. 01/05/2020 upon evaluation today patient appears to be doing more poorly in regard to his right lower extremity at this point upon evaluation today. Unfortunately he continues to have issues in this regard and I think the biggest issue is controlling his edema. This obviously is not very well controlled at this point is been recommended that he use the Lasix twice a day but he has not been able to do that. Unfortunately I think this is leading to an issue where honestly he is not really able to effectively control his edema and therefore the wounds really are not doing significantly better. I do not think that he is going to be able to keep things under good control unless he is able to control his edema much better. I discussed this again in great detail with him today. 01/12/2020 good news is patient actually appears to be doing quite well today at this point. He does have an appointment with lymphedema clinic tomorrow. His legs appear healed and the toe on the left is almost completely healed. In general I am very pleased with how things stand at this point. 01/19/2020 upon evaluation today patient appears to actually be doing well in regard to his lower extremities there is nothing open at this point. Fortunately he has done extremely well more recently. Has been seeing lymphedema clinic as well. With that being said he has Velcro wraps for his lower legs as well as his upper legs. The only wound really is on his toe  which is the right great toe and this is barely anything even there. With all that being said I think it is good to be appropriate today to go ahead and switch him over to the Velcro compression wraps. 01/26/2020 upon evaluation today patient appears to be doing worse with regard to his lower extremities after last week switch him to Velcro compression wraps. Unfortunately he lasted less than 24 hours he did not have the sock portion of his Velcro wrap on the left leg and subsequently developed a blister underneath the Velcro portion. Obviously this is not good and not what we were looking for at this point. He states the lymphedema clinic did tell him to wear the wrap for 23 hours and take him off for 1 I am okay with that plan but again right now we got a get things back under control again he may have some cellulitis noted as well. 02/02/2020 upon evaluation today patient unfortunately appears to have several areas of blistering on his bilateral lower extremities today mainly on the feet. His legs do seem to be doing somewhat better which is good news. Fortunately there is no evidence of active infection at this time. No fevers, chills, nausea, vomiting, or diarrhea. 02/16/2020 upon evaluation today patient appears to be doing well at this time with regard to his legs. He has a couple weeping areas on his toes but for the most part everything is doing better and does appear to be sealed up on his legs which is excellent news. We can continue with wrapping him at this point as  he had every time we discontinue the wraps he just breaks out with new wounds. There is really no point in is going forward with this at this point. 03/08/2020 upon evaluation today patient actually appears to be doing quite well with regard to his lower extremity ulcers. He has just a very superficial and really almost nonexistent blister on the left lower extremity he has in general done very well with the compression wraps. With  that being said I do not see any signs of infection at this time which is good news. 03/29/2020 upon evaluation today patient appears to be doing well with regard to his wounds currently except for where he had several new areas that opened up due to some of the wrap slipping and causing him trouble. He states he did not realize they had slipped. Nonetheless he has a 1 area on the right and 3 new areas on the left. Fortunately there is no signs of active infection at this time which is great news. 04/05/2020 upon evaluation today patient actually appears to be doing quite well in general in regard to his legs currently. Fortunately there is no signs of active infection at this time. No fevers, chills, nausea, vomiting, or diarrhea. He tells me next week that he will actually be seen in the lymphedema clinic on Thursday at 10 AM I see him on Wednesday next week. 04/12/2020 upon evaluation today patient appears to be doing very well with regard to his lower extremities bilaterally. In fact he does not appear to have any open wounds at this point which is good news. Fortunately there is no signs of active infection at this time. No fevers, chills, nausea, vomiting, or diarrhea. 04/19/2020 upon evaluation today patient appears to be doing well with regard to his wounds currently on the bilateral lower extremities. There does not appear to be any signs of active infection at this time. Fortunately there is no evidence of systemic infection and overall very pleased at this point. Nonetheless after I held him out last week he literally had blisters the next morning already which swelled up with him being right back here in the clinic. Overall I think that he is just not can be able to be discharged with his legs the way they are he is much to volume overloaded as far as fluid is concerned and that was discussed with him today of also discussed this but should try the clinic nurse manager as well as Dr.  Leanord Hawking. 04/26/2020 upon evaluation today patient appears to be doing better with regard to his wounds currently. He is making some progress and overall swelling is under good control with the compression wraps. Fortunately there is no evidence of active infection at this time. 05/10/2020 on evaluation today patient appears to be doing overall well in regard to his lower extremities bilaterally. He is Tolerating the compression wraps without complication and with what we are seeing currently I feel like that he is making excellent progress. There is no signs of active infection at this time. 05/24/2020 upon evaluation today patient appears to be doing well in regard to his legs. The swelling is actually quite a bit down compared to where it has been in the past. Fortunately there is no sign of active infection at this time which is also good news. With that being said he does have several wounds on his toes that have opened up at this point. 05/31/2020 upon evaluation today patient appears to be doing well with  regard to his legs bilaterally where he really has no significant fluid buildup at this point overall he seems to be doing quite well. Very pleased in this regard. With regard to his toes these also seem to be drying up which is excellent. We have continue to wrap him as every time we tried as a transition to the juxta light wraps things just do not seem to get any better. 06/07/2020 upon evaluation today patient appears to be doing well with regard to his right leg at this point. Unfortunately left leg has a lot of blistering he tells me the wrap started to slide down on him when he tried to put his other Velcro wrap over top of it to help keep things in order but nonetheless still had some issues. 06/14/2020 on evaluation today patient appears to be doing well with regard to his lower extremity ulcers and foot ulcers at this point. I feel like everything is actually showing signs of improvement which  is great news overall there is no signs of active infection at this time. No fevers, chills, nausea, vomiting, or diarrhea. 06/21/2020 on evaluation today patient actually appears to be doing okay in regard to his wounds in general. With that being said the biggest issue I see is on his right foot in particular the first and second toe seem to be doing a little worse due to the fact this is staying very wet. I think he is probably getting need to change out his dressings a couple times in between each week when we see him in regard to his toes in order to keep this drier based on the location and how this is proceeding. 06/28/2020 on evaluation today patient appears to be doing a little bit more poorly overall in regard to the appearance of the skin I am actually somewhat concerned about the possibility of him having a little bit of an infection here. We discussed the course of potentially giving him a doxycycline prescription which he is taken previously with good result. With that being said I do believe that this is potentially mild and at this point easily fixed. I just do not want anything to get any worse. 07/12/2020 upon evaluation today patient actually appears to be making some progress with regard to his legs which is great news there does not appear to be any evidence of active infection. Overall very pleased with where things stand. 07/26/2020 upon evaluation today patient appears to be doing well with regard to his leg ulcers and toe ulcers at this point. He has been tolerating the compression wraps without complication overall very pleased in this regard. 08/09/2020 upon evaluation today patient appears to be doing well with regard to his lower extremities bilaterally. Fortunately there is no signs of active infection overall I am pleased with where things stand. 08/23/2020 on evaluation today patient appears to be doing well with regard to his wound. He has been tolerating the dressing  changes without complication. Fortunately there is no signs of active infection at this time. Overall his legs seem to be doing quite well which is great news and I am very pleased in that regard. No fevers, chills, nausea, vomiting, or diarrhea. 09/13/2020 upon evaluation today patient appears to be doing okay in regard to his lower extremities. He does have a fairly large blister on the right leg which I did remove the blister tissue from today so we can get this to dry out other than that however he seems  to be doing quite well. There is no signs of active infection at this time. 09/27/2020 upon evaluation today patient appears to actually be doing some better in regard to his right leg. Fortunately signs of active infection at this time which is great news. No fevers, chills, nausea, vomiting, or diarrhea. 10/04/2020 upon evaluation today patient actually appears to be showing signs of improvement which is great news with regard to his leg ulcers. Fortunately there is no signs of active infection which is great news he is still taking the antibiotics currently. No fevers, chills, nausea, vomiting, or diarrhea. 10/18/2020 on evaluation today patient appears to be doing well with regard to his legs currently. He has been tolerating the dressing changes including the wraps without complication. Fortunately there is no signs of active infection at this time. No fevers, chills, nausea, vomiting, or diarrhea. 10/25/2020 upon evaluation today patient appears to be doing decently well in regard to his wounds currently. He has been tolerating the dressing changes without complication. Overall I feel like he is making good progress albeit slow. Again this is something we can have to continue to wrap for some time to come most likely. 11/08/2020 upon evaluation today patient appears to be doing well with regard to his wounds currently. He has been tolerating the dressing changes without complication is not  currently on any antibiotics and he does not appear to show any signs of infection. He does continue to have a lot of drainage on the right leg not too severe but nonetheless this is very scattered. On the left leg this is looking to be much improved overall. 11/15/2020 upon evaluation today patient appears to be doing better with regard to his legs bilaterally. Especially the right leg which was much more significant last week. There does not appear to be any signs of active infection which is great news. No fevers, chills, nausea, vomiting, or diarrhea. 11/23/2019 upon evaluation today patient appears to be doing poorly still in regard to his lower extremities bilaterally. Unfortunately his right leg in particular appears to be doing much more poorly there is no signs really of infection this is not warm to touch but he does have a lot of drainage and weeping unfortunately. With that reason I do believe that we may need to initiate some treatment here to try to help calm down some of the swelling of the right leg. I think switching to a 4-layer compression wrap would be beneficial here. The patient is in agreement with giving this a try. 11/29/2020 upon evaluation today patient appears to be doing well currently in regard to his leg ulcers. I feel like the right leg is doing better he still has a lot of drainage but we do see some improvement here. The 4-layer compression wrap I think was helpful. 12/06/2020 upon evaluation today patient appears to be doing well with regard to his legs. In fact they seem to be doing about the best I have seen up to this point. Fortunately there is no signs of active infection at this time. No fevers, chills, nausea, vomiting, or diarrhea. 12/20/2020 upon evaluation today patient appears to be doing well at this time in regard to his legs. He is not having any significant draining which is great news. Fortunately there is no signs of active infection at this time. No fevers,  chills, nausea, vomiting, or diarrhea. Electronic Signature(s) Signed: 12/20/2020 11:06:49 AM By: Lenda Kelp PA-C Entered By: Lenda Kelp on 12/20/2020 11:06:48 --------------------------------------------------------------------------------  Physical Exam Details Patient Name: Date of Service: CORT, DRAGOO 12/20/2020 10:30 A M Medical Record Number: [id number] Patient Account Number: [id number] Date of Birth/Sex: Treating RN: 1951/01/10 (70 y.o. [name] Primary Care Provider: Nicoletta Ba Other Clinician: Referring Provider: Treating Provider/Extender: Adele Dan in Treatment: 256 Constitutional Obese and well-hydrated in no acute distress. Respiratory normal breathing without difficulty. Psychiatric this patient is able to make decisions and demonstrates good insight into disease process. Alert and Oriented x 3. pleasant and cooperative. Notes Upon inspection patient's wounds are showing signs of good epithelization. I do not see anything that significantly open at this point which is great he has very little drainage and moisture along his legs. I think we can discontinue the use altogether of the zinc at this point otherwise I think is just can take up a lot on his leg. Electronic Signature(s) Signed: 12/20/2020 11:22:58 AM By: Lenda Kelp PA-C Entered By: Lenda Kelp on 12/20/2020 11:22:58 -------------------------------------------------------------------------------- Physician Orders Details Patient Name: Date of Service: [name], Yoshiaki J. 12/20/2020 10:30 A M Medical Record Number: [id number] Patient Account Number: [id number] Date of Birth/Sex: Treating RN: [birth year] (70 y.o. 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 - MD Return A Nurse Visit: - 1 week Bathing/ Shower/ Hygiene May shower with protection but do not get wound dressing(s) wet. Edema Control - Lymphedema / SCD / Other Bilateral Lower Extremities Lymphedema Pumps. Use Lymphedema pumps on leg(s) 2-3 times a day for 45-60 minutes. If wearing any wraps or hose, do not remove them. Continue exercising as instructed. Elevate legs to the level of the heart or above for 30 minutes daily and/or when sitting, a frequency of: - throughout the day Avoid standing for long periods of time. Exercise regularly Non Wound Condition Right Lower Extremity Other Non Wound Condition Orders/Instructions: - lotion to leg, 4 layer compression wrap Wound Treatment Wound #191 - Foot Wound Laterality: Dorsal, Left, Lateral Peri-Wound Care: Zinc Oxide Ointment 30g tube 1 x Per Week/30 Days Discharge Instructions: Apply Zinc Oxide to periwound with each dressing change Peri-Wound Care: Sween Lotion (Moisturizing lotion) 1 x Per Week/30 Days Discharge Instructions: Apply moisturizing lotion to leg Prim Dressing: KerraCel Ag Gelling Fiber Dressing, 2x2 in (silver alginate) 1 x Per Week/30 Days ary Discharge Instructions: Apply silver alginate to wound bed as instructed Secondary Dressing: Woven Gauze Sponge, Non-Sterile 4x4 in 1 x Per Week/30 Days Discharge Instructions: Apply over primary dressing as directed. Secondary Dressing: ABD Pad, 5x9 1 x  Per Week/30 Days Discharge Instructions: Apply over primary dressing as directed. Compression Wrap: FourPress (4 layer compression wrap) 1 x Per Week/30 Days Discharge Instructions: Apply four layer compression as directed. Electronic Signature(s) Signed: 12/20/2020 11:40:16 AM By: Lenda Kelp PA-C Signed: 12/20/2020 6:01:36 PM By: Zenaida Deed RN, BSN Entered By: Zenaida Deed on 12/20/2020 11:08:17 -------------------------------------------------------------------------------- Problem List Details Patient Name: Date of  Service: [name], Audrick J. 12/20/2020 10:30 A M Medical Record Number: [id number] Patient Account Number: [id number] Date of Birth/Sex: Treating RN: [birth year] (70 y.o. [name] Primary Care Provider: Nicoletta Ba Other Clinician: Referring Provider: Treating Provider/Extender: Adele Dan in Treatment: 256 Active Problems ICD-10 Encounter Code Description Active Date MDM Diagnosis 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 skin 06/30/2018 No Yes L97.221 Non-pressure chronic ulcer of left calf limited to breakdown of skin 09/30/2016 No Yes 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 of 01/22/2016 No Yes bilateral lower extremity I89.0 Lymphedema, not elsewhere classified 01/22/2016 No Yes E11.622 Type 2 diabetes mellitus with other skin ulcer 01/22/2016 No Yes 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, unspecified 01/22/2016 No Yes L03.116 Cellulitis of left lower limb 04/01/2017 No Yes Inactive Problems ICD-10 Code Description Active Date Inactive Date 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 skin 06/30/2017 06/30/2017 L97.521 Non-pressure chronic ulcer of other 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 of skin 04/27/2018 04/27/2018 L03.115 Cellulitis of right lower limb 12/22/2017 12/22/2017 L97.228 Non-pressure chronic  ulcer of left calf with other specified severity 06/30/2018 06/30/2018 L97.511 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 of skin 06/30/2018 06/30/2018 Resolved Problems Electronic Signature(s) Signed: 12/20/2020 10:20:48 AM By: Lenda Kelp PA-C Entered By: Lenda Kelp on 12/20/2020 10:20:47 -------------------------------------------------------------------------------- Progress Note Details Patient Name: Date of Service: [name], Alonzo J. 12/20/2020 10:30 A M Medical Record Number: [id number] Patient Account Number: [id number] Date of Birth/Sex: Treating RN: [birth year] (70 y.o. [name] Primary Care Provider: Nicoletta Ba Other Clinician: Referring Provider: Treating Provider/Extender: Adele Dan in Treatment: 256 Subjective Chief Complaint Information obtained from Patient patient is here for evaluation venous/lymphedema weeping History of Present Illness (HPI) Referred by PCP for consultation. Patient has long standing history of BLE venous stasis, no prior ulcerations. At beginning of month, developed cellulitis and weeping. Received IM Rocephin followed by Keflex and resolved. Wears compression stocking, appr 6 months old. Not sure strength. No present drainage. 01/22/16 this is a patient who is a type II diabetic on insulin. 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 He tells me he religiously wears pressure stockings of uncertain strength. He was here with weeping edema about 8 months ago but did not have an open wound. Roughly a month ago he had a reopening on his bilateral legs. He is been using bandages and Neosporin. He does not complain of pain. 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 Last BUN/creatinine I have is from 11/20/15 at 13 and 1.0  respectively 01/29/16; patient arrives today having tolerated the Profore wrap. He brought in his stockings and these are 18 mmHg stockings he bought from [employer]. The compression here is likely inadequate. He does not complain of pain or excessive drainage she has no systemic symptoms. The wound on the right looks improved as does the one on the left although one on the left is more substantial with still tissue at risk below the actual wound area on the bilateral posterior calf 02/05/16; patient arrives with poor edema control. He states that we did put a 4 layer compression on it last week. No weight appear 5 this. 02/12/16; the area on the posterior right Has healed. The left  Has a substantial wound that has necrotic surface eschar that requires a debridement with a curette. 02/16/16;the patient called or a Nurse visit secondary to increased swelling. He had been in earlier in the week with his right leg healed. He was transitioned to is on pressure stocking on the right leg with the only open wound on the left, a substantial area on the left posterior calf. Note he has a history of severe lower extremity edema, he has a history of chronic atrial fibrillation but not heart failure per my notes but I'll need to research this. He is not complaining of chest pain shortness of breath or orthopnea. The intake nurse noted blisters on the previously closed right leg 02/19/16; this is the patient's regular visit day. I see him on Friday with escalating edema new wounds on the right leg and clear signs of at least right ventricular heart failure. I increased his Lasix to 40 twice a day. He is returning currently in follow-up. States he is noticed a decrease in that the edema 02/26/16 patient's legs have much less edema. There is nothing really open on the right leg. The left leg has improved condition of the large superficial wound on the posterior left leg 03/04/16; edema control is very much better. The  patient's right leg wounds have healed. On the left leg he continues to have severe venous inflammation on the posterior aspect of the left leg. There is no tenderness and I don't think any of this is cellulitis. 03/11/16; patient's right leg is married healed and he is in his own stocking. The patient's left leg has deteriorated somewhat. There is a lot of erythema around the wound on the posterior left leg. There is also a significant rim of erythema posteriorly just above where the wrap would've ended there is a new wound in this location and a lot of tenderness. Can't rule out cellulitis in this area. 03/15/16; patient's right leg remains healed and he is in his own stocking. The patient's left leg is much better than last review. His major wound on the posterior aspect of his left Is almost fully epithelialized. He has 3 small injuries from the wraps. Really. Erythema seems a lot better on antibiotics 03/18/16; right leg remains healed and he is in his own stocking. The patient's left leg is much better. The area on the posterior aspect of the left calf is fully epithelialized. His 3 small injuries which were wrap injuries on the left are improved only one seems still open his erythema has resolved 03/25/16; patient's right leg remains healed and he is in his own stocking. There is no open area today on the left leg posterior leg is completely closed up. His wrap injuries at the superior aspect of his leg are also resolved. He looks as though he has some irritation on the dorsal ankle but this is fully epithelialized without evidence of infection. 03/28/16; we discharged this patient on Monday. Transitioned him into his own stocking. There were problems almost immediately with uncontrolled swelling weeping edema multiple some of which have opened. He does not feel systemically unwell in particular no chest pain no shortness of breath and he does not feel 04/08/16; the edema is under better control with  the Profore light wrap but he still has pitting edema. There is one large wound anteriorly 2 on the medial aspect of his left leg and 3 small areas on the superior posterior calf. Drainage is not excessive he is tolerating a Profore  light well 04/15/16; put a Profore wrap on him last week. This is controlled is edema however he had a lot of pain on his left anterior foot most of his wounds are healed 04/22/16 once again the patient has denuded areas on the left anterior foot which he states are because his wrap slips up word. He saw his primary physician today is on Lasix 40 twice a day and states that he his weight is down 20 pounds over the last 3 months. 04/29/16: Much improved. left anterior foot much improved. He is now on Lasix 80 mg per day. Much improved edema control 05/06/16; I was hoping to be able to discharge him today however once again he has blisters at a low level of where the compression was placed last week mostly on his left lateral but also his left medial leg and a small area on the anterior part of the left foot. 05/09/16; apparently the patient went home after his appointment on 7/4 later in the evening developing pain in his upper medial thigh together with subjective fever and chills although his temperature was not taken. The pain was so intense he felt he would probably have to call 911. However he then remembered that he had leftover doxycycline from a previous round of antibiotics and took these. By the next morning he felt a lot better. He called and spoke to one of our nurses and I approved doxycycline over the phone thinking that this was in relation to the wounds we had previously seen although they were definitely were not. The patient feels a lot better old fever no chills he is still working. Blood sugars are reasonably controlled 05/13/16; patient is back in for review of his cellulitis on his anterior medial upper thigh. He is taking doxycycline this is a lot better.  Culture I did of the nodular area on the dorsal aspect of his foot grew MRSA this also looks a lot better. 05/20/16; the patient is cellulitis on the medial upper thigh has resolved. All of his wound areas including the left anterior foot, areas on the medial aspect of the left calf and the lateral aspect of the calf at all resolved. He has a new blister on the left dorsal foot at the level of the fourth toe this was excised. No evidence of infection 05/27/16; patient continues to complain weeping edema. He has new blisterlike wounds on the left anterior lateral and posterior lateral calf at the top of his wrap levels. The area on his left anterior foot appears better. He is not complaining of fever, pain or pruritus in his feet. 05/30/16; the patient's blisters on his left anterior leg posterior calf all look improved. He did not increase the Lasix 100 mg as I suggested because he was going to run out of his 40 mg tablets. He is still having weeping edema of his toes 06/03/16; I renewed his Lasix at 80 mg once a day as he was about to run out when I last saw him. He is on 80 mg of Lasix now. I have asked him to cut down on the excessive amount of water he was drinking and asked him to drink according to his thirst mechanisms 06/12/2016 -- was seen 2 days ago and was supposed to wear his compression stockings at home but he is developed lymphedema and superficial blisters on the left lower extremity and hence came in for a review 06/24/16; the remaining wound is on his left anterior leg. He  still has edema coming from between his toes. There is lymphedema here however his edema is generally better than when I last saw this. He has a history of atrial fibrillation but does not have a known history of congestive heart failure nevertheless I think he probably has this at least on a diastolic basis. 07/01/16 I reviewed his echocardiogram from January 2017. This was essentially normal. He did not have LVH, EF  of 55-60%. His right ventricular function was normal although he did have trivial tricuspid and pulmonic regurgitation. This is not audible on exam however. I increased his Lasix to do massive edema in his legs well above his knees I think in early July. He was also drinking an excessive amount of water at the time. 07/15/16; missed his appointment last week because of the Labor Day holiday on Monday. He could not get another appointment later in the week. Started to feel the wrap digging in superiorly so we remove the top half and the bottom half of his wrap. He has extensive erythema and blistering superiorly in the left leg. Very tender. Very swollen. Edema in his foot with leaking edema fluid. He has not been systemically unwell 07/22/16; the area on the left leg laterally required some debridement. The medial wounds look more stable. His wrap injury wounds appear to have healed. Edema and his foot is better, weeping edema is also better. He tells me he is meeting with the supplier of the external compression pumps at work 08/05/16; the patient was on vacation last week in [facility]. His wrap is been on for an extended period of time. Also over the weekend he developed an extensive area of tender erythema across his anterior medial thigh. He took to doxycycline yesterday that he had leftover from a previous prescription. The patient complains of weeping edema coming out of his toes 08/08/16; I saw this patient on 10/2. He was tender across his anterior thigh. I put him on doxycycline. He returns today in follow-up. He does not have any open wounds on his lower leg, he still has edema weeping into his toes. 08/12/16; patient was seen back urgently today to follow-up for his extensive left thigh cellulitis/erysipelas. He comes back with a lot less swelling and erythema pain is much better. I believe I gave him Augmentin and Cipro. His wrap was cut down as he stated a roll down his legs. He developed  blistering above the level of the wrap that remained. He has 2 open blisters and 1 intact. 08/19/16; patient is been doing his primary doctor who is increased his Lasix from 40-80 once a day or 80 already has less edema. Cellulitis has remained improved in the left thigh. 2 open areas on the posterior left calf 08/26/16; he returns today having new open blisters on the anterior part of his left leg. He has his compression pumps but is not yet been shown how to use some vital representative from the supplier. 09/02/16 patient returns today with no open wounds on the left leg. Some maceration in his plantar toes 09/10/2016 -- Dr. Leanord Hawking had recently discharged him on 09/02/2016 and he has come right back with redness swelling and some open ulcers on his left lower extremity. He says this was caused by trying to apply his compression stockings and he's been unable to use this and has not been able to use his lymphedema pumps. He had some doxycycline leftover and he has started on this a few days ago. 09/16/16; there  are no open wounds on his leg on the left and no evidence of cellulitis. 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 He does not complain of symptoms here. I am not clear use using his external compression pumps. 09/23/16; I have not seen this patient in 2 weeks. He canceled his appointment 10 days ago as he was going on vacation. He tells me that on Monday he noticed a large area on his posterior left leg which is been draining copiously and is reopened into a large wound. He is been using ABDs and the external part of his juxtalite, according to our nurse this was not on properly. 10/07/16; Still a substantial area on the posterior left leg. Using silver alginate 10/14/16; in general better although there is still open area which looks healthy. Still using silver alginate. He reminds me that this happen before he left for [facility]. T oday while he was showering in the morning. He had been using his juxtalite's 10/21/16; the area on his posterior left leg is fully epithelialized. However he arrives today with a large area of tender erythema in his medial and posterior left thigh just above the knee. I have marked the area. Once again he is reluctant to consider hospitalization. I treated him with oral antibiotics in the past for a similar situation with resolution I think with doxycycline however this area it seems more extensive to me. He is not complaining of fever but does have chills and says states he is thirsty. His blood sugar today was in the 140s at home 10/25/16 the area on his posterior left leg is fully epithelialized although there is still some weeping edema. The large area of tenderness and erythema in his medial and posterior left thigh is a lot less tender although there is still a lot of swelling in this thigh. He states he feels a lot better. He is on doxycycline and Augmentin that I started last week. This will continued until Tuesday, December 26. I have ordered a duplex ultrasound of the left thigh rule out DVT whether there is an abscess something that would need to be drained I would also like to know. 11/01/16; he still has weeping edema from a not fully epithelialized area on his left posterior calf. Most of the rest of this looks a lot better. He has completed his antibiotics. His thigh is a lot better. Duplex ultrasound did not show a DVT in the thigh 11/08/16; he comes in today with more Denuded surface epithelium from the posterior aspect of his calf. There is no real evidence of cellulitis. The superior aspect of his wrap appears to have put quite an indentation in his leg just below the knee and this may have contributed. He does not complain of pain or fever. We have been using silver alginate as the primary dressing. The area of cellulitis in the right thigh has totally resolved. He has been  using his compression stockings once a week 11/15/16; the patient arrives today with more loss of epithelium from the posterior aspect of his left calf. He now has a fairly substantial wound in this area. The reason behind this deterioration isn't exactly clear although his edema is not well controlled. He states he feels he is generally more swollen systemically. He is not complaining of chest pain shortness of breath fever. T me he has an appointment with his primary physician in early February. He is on 76  mg of oral ells Lasix a day. He claims compliance with the external compression pumps. He is not having any pain in his legs similar to what he has with his recurrent cellulitis 11/22/16; the patient arrives a follow-up of his large area on his left lateral calf. This looks somewhat better today. He came in earlier in the week for a dressing change since I saw him a week ago. He is not complaining of any pain no shortness of breath no chest pain 11/28/16; the patient arrives for follow-up of his large area on the left lateral calf this does not look better. In fact it is larger weeping edema. The surface of the wound does not look too bad. We have been using silver alginate although I'm not certain that this is a dressing issue. 12/05/16; again the patient follows up for a large wound on the left lateral and left posterior calf this does not look better. There continues to be weeping edema necrotic surface tissue. More worrisome than this once again there is erythema below the wound involving the distal Achilles and heel suggestive of cellulitis. He is on his feet working most of the day of this is not going well. We are changing his dressing twice a week to facilitate the drainage. 12/12/16; not much change in the overall dimensions of the large area on the left posterior calf. This is very inflamed looking. I gave him an. Doxycycline last week does not really seem to have helped. He found the wrap  very painful indeed it seems to of dog into his legs superiorly and perhaps around the heel. He came in early today because the drainage had soaked through his dressings. 12/19/16- patient arrives for follow-up evaluation of his left lower extremity ulcers. He states that he is using his lymphedema pumps once daily when there is "no drainage". He admits to not using his lipedema pumps while under current treatment. His blood sugars have been consistently between 150-200. 12/26/16; the patient is not using his compression pumps at home because of the wetness on his feet. I've advised him that I think it's important for him to use this daily. He finds his feet too wet, he can put a plastic bag over his legs while he is in the pumps. Otherwise I think will be in a vicious circle. We are using silver alginate to the major area on his left posterior calf 01/02/17; the patient's posterior left leg has further of all into 3 open wounds. All of them covered with a necrotic surface. He claims to be using his compression pumps once a day. His edema control is marginal. Continue with silver alginate 01/10/17; the patient's left posterior leg actually looks somewhat better. There is less edema, less erythema. Still has 3 open areas covered with a necrotic surface requiring debridement. He claims to be using his compression pumps once a day his edema control is better 01/17/17; the patient's left posterior calf look better last week when I saw him and his wrap was changed 2 days ago. He has noted increasing pain in the left heel and arrives today with much larger wounds extensive erythema extending down into the entire heel area especially tender medially. He is not systemically unwell CBGs have been controlled no fever. Our intake nurse showed me limegreen drainage on his AVD pads. 01/24/17; his usual this patient responds nicely to antibiotics last week giving him Levaquin for presumed Pseudomonas. The whole entire  posterior part of his leg is much  better much less inflamed and in the case of his Achilles heel area much less tender. He has also had some epithelialization posteriorly there are still open areas here and still draining but overall considerably better 01/31/17- He has continue to tolerate the compression wraps. he states that he continues to use the lymphedema pumps daily, and can increase to twice daily on the weekends. He is voicing no complaints or concerns regarding his LLE ulcers 02/07/17-he is here for follow-up evaluation. He states that he noted some erythema to the left medial and anterior thigh, which he states is new as of yesterday. He is concerned about recurrent cellulitis. He states his blood sugars have been slightly elevated, this morning in the 180s 02/14/17; he is here for follow-up evaluation. When he was last here there was erythema superiorly from his posterior wound in his anterior thigh. He was prescribed Levaquin however a culture of the wound surface grew MRSA over the phone I changed him to doxycycline on Monday and things seem to be a lot better. 02/24/17; patient missed his appointment on Friday therefore we changed his nurse visit into a physician visit today. Still using silver alginate on the large area of the posterior left thigh. He isn't new area on the dorsal left second toe 03/03/17; actually better today although he admits he has not used his external compression pumps in the last 2 days or so because of work responsibilities over the weekend. 03/10/17; continued improvement. External compression pumps once a day almost all of his wounds have closed on the posterior left calf. Better edema control 03/17/17; in general improved. He still has 3 small open areas on the lateral aspect of his left leg however most of the area on the posterior part of his leg is epithelialized. He has better edema control. He has an ABD pad under his stocking on the right anterior lower leg  although he did not let us look at that today. 03/24/17; patient arrives back in clinic today with no open areas however there are areas on the posterior left calf and anterior left calf that are less than 100% epithelialized. His edema is well controlled in the left lower leg. There is some pitting edema probably lymphedema in the left upper thigh. He uses compression pumps at home once per day. I tried to get him to do this twice a day although he is very reticent. 04/01/2017 -- for the last 2 days he's had significant redness, tenderness and weeping and came in for an urgent visit today. 04/07/17; patient still has 6 more days of doxycycline. He was seen by Dr. Meyer Russel last Wednesday for cellulitis involving the posterior aspect, lateral aspect of his Involving his heel. For the most part he is better there is less erythema and less weeping. He has been on his feet for 12 hours o2 over the weekend. Using his compression pumps once a day 04/14/17 arrives today with continued improvement. Only one area on the posterior left calf that is not fully epithelialized. He has intense bilateral venous inflammation associated with his chronic venous insufficiency disease and secondary lymphedema. We have been using silver alginate to the left posterior calf wound In passing he tells [place] today that the right leg but we have not seen in quite some time has an open area on it but he doesn't want [place] to look at this today states he will show this to [place] next week. 04/21/17; there is no open area on his left leg  although he still reports some weeping edema. He showed [place] his right leg today which is the first time we've seen this leg in a long time. He has a large area of open wound on the right leg anteriorly healthy granulation. Quite a bit of swelling in the right leg and some degree of venous inflammation. He told us about the right leg in passing last week but states that deterioration in the right leg really only  happened over the weekend 04/28/17; there is no open area on the left leg although there is an irritated part on the posterior which is like a wrap injury. The wound on the right leg which was new from last week at least to [place] is a lot better. 05/05/17; still no open area on the left leg. Patient is using his new compression stocking which seems to be doing a good job of controlling the edema. He states he is using his compression pumps once per day. The right leg still has an open wound although it is better in terms of surface area. Required debridement. A lot of pain in the posterior right Achilles marked tenderness. Usually this type of presentation this patient gives concern for an active cellulitis 05/12/17; patient arrives today with his major wound from last week on the right lateral leg somewhat better. Still requiring debridement. He was using his compression stocking on the left leg however that is reopened with superficial wounds anteriorly he did not have an open wound on this leg previously. He is still using his juxta light's once daily at night. He cannot find the time to do this in the morning as he has to be at work by 7 AM 05/19/17; right lateral leg wound looks improved. No debridement required. The concerning area is on the left posterior leg which appears to almost have a subcutaneous hemorrhagic component to it. We've been using silver alginate to all the wounds 05/26/17; the right lateral leg wound continues to look improved. However the area on the left posterior calf is a tightly adherent surface. [place] using silver alginate. Because of the weeping edema in his legs there is very little good alternatives. 06/02/17; the patient left here last week looking quite good. Major wound on the left posterior calf and a small one on the right lateral calf. Both of these look satisfactory. He tells me that by Wednesday he had noted increased pain in the left leg and drainage. He called on  Thursday and Friday to get an appointment here but we were blocked. He did not go to urgent care or his primary physician. He thinks he had a fever on Thursday but did not actually take his temperature. He has not been using his compression pumps on the left leg because of pain. I advised him to go to the emergency room today for IV antibiotics for stents of left leg cellulitis but he has refused I have asked him to take 2 days off work to keep his leg elevated and he has refused this as well. In view of this I'm going to call him and Augmentin and doxycycline. 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 However after a detailed discussion regarding the risk benefits and alternatives he refuses to get  admitted to the hospital. With no other recourse I will continue him on oral antibiotics as before and hopefully he'll have his infectious disease consultation this week. 06/16/2017 -- the patient was seen today by the nurse practitioner at infectious disease Ms. Dixon. 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 He has also been advise Lotrimin cream locally. she also had wise IV antibiotics for his condition if it flares up 06/23/17; patient arrives today with drainage bilaterally although the remaining wound on the left posterior calf after cleaning up today "highlighter yellow drainage" did not look too bad. Unfortunately he has had breakdown on the right anterior leg [previously this leg had not been open and he is using a black stocking] he went to see infectious disease and is been  put on clindamycin 150 mg daily, I did not verify the dose although I'm not familiar with using clindamycin in this dosing range, perhaps for prophylaxisoo 06/27/17; I brought this patient back today to follow-up on the wound deterioration on the right lower leg together with surrounding cellulitis. I started him on doxycycline 4 days ago. This area looks better however he comes in today with intense cellulitis on the medial part of his left thigh. This is not have a wound in this area. Extremely tender. We've been using silver alginate to the wounds on the right lower leg left lower leg with bilateral 4 layer compression he is using his external compression pumps once a day 07/04/17; patient's left medial thigh cellulitis looks better. He has not been using his compression pumps as his insert said it was contraindicated with cellulitis. His right leg continues to make improvements all the wounds are still open. We only have one remaining wound on the left posterior calf. Using silver alginate to all open areas. He is on doxycycline which I started a week ago and should be finishing I gave him Augmentin after Thursday's visit for the severe cellulitis on the left medial thigh which fortunately looks better 07/14/17; the patient's left medial thigh cellulitis has resolved. The cellulitis in his right lower calf on the right also looks better. All of his wounds are stable to improved we've been using silver alginate he has completed the antibiotics I have given him. He has clindamycin 150 mg once a day prescribed by infectious disease for prophylaxis, I've advised him to start this now. We have been using bilateral Unna boots over silver alginate to the wound areas 07/21/17; the patient is been to see infectious disease who noted his recurrent problems with cellulitis. He was not able to tolerate prophylactic clindamycin therefore he is on amoxicillin 500 twice a day. He also had a second daily dose of Lasix  added By Dr. Oneta Rack but he is not taking this. Nor is he being completely compliant with his compression pumps a especially not this week. He has 2 remaining wounds one on the right posterior lateral lower leg and one on the left posterior medial lower leg. 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 The patient has Unna boots bilaterally. 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  He has bilateral Unna boots for compression and silver alginate to his wounds. Arrives today with his legs looking as good as I have seen him in quite some time. Not surprisingly his wounds look better as well with improvement on the right lateral leg venous insufficiency wound and also the left medial leg. He is still using the compression pumps once a day 08/11/17; both legs appear to be doing better wounds on the right lateral and left medial legs look better. Skin on the right leg quite good. He is been using silver alginate as the primary dressing. I'm going to use Anasept gel calcium alginate and maintain all the secondary dressings 08/18/17; the patient continues to actually do quite well. The area on his right lateral leg is just about closed the left medial also looks better although it is still moist in this area. His edema is well controlled we have been using Anasept gel with calcium alginate and the usual secondary dressings, 4 layer compression and once daily use of his compression pumps "always been able to manage 09/01/17; the patient continues to do reasonably well in spite of his trip to [place]. The area on the right lateral leg is epithelialized. Left is much better but still open. He has more edema and more chronic erythema on the left leg [venous inflammation] 09/08/17; he  arrives today with no open wound on the right lateral leg and decently controlled edema. Unfortunately his left leg is not nearly as in his good situation as last week.he apparently had increasing edema starting on Saturday. He edema soaked through into his foot so used a plastic bag to walk around his home. The area on the medial right leg which was his open area is about the same however he has lost surface epithelium on the left lateral which is new and he has significant pain in the Achilles area of the left foot. He is already on amoxicillin chronically for prophylaxis of cellulitis in the left leg 09/15/17; he is completed a week of doxycycline and the cellulitis in the left posterior leg and Achilles area is as usual improved. He still has a lot of edema and fluid soaking through his dressings. There is no open wound on the right leg. He saw infectious disease NP today 09/22/17;As usual 1 we transition him from our compression wraps to his stockings things did not go well. He has several small open areas on the right leg. He states this was caused by the compression wrap on his skin although he did not wear this with the stockings over them. He has several superficial areas on the left leg medially laterally posteriorly. He does not have any evidence of active cellulitis especially involving the left Achilles The patient is traveling from [facility] Saturday going to [facility]. He states he isn't attempting to get an appointment with a heel objects wound center there to change his dressings. I am not completely certain whether this will work 10/06/17; the patient came in on Friday for a nurse visit and the nurse reported that his legs actually look quite good. He arrives in clinic today for his regular follow-up visit. He has a new wound on his left third toe over the PIP probably caused by friction with his footwear. He has small areas on the left leg and a very superficial but epithelialized  area on the right anterior lateral lower leg. Other than that his legs look as good as I've seen him in quite some  time. We have been using silver alginate Review of systems; no chest pain no shortness of breath other than this a 10 point review of systems negative 10/20/17; seen by Dr. Meyer Russel last week. He had taken some antibiotics [doxycycline] that he had left over. Dr. Meyer Russel thought he had candida infection and declined to give him further antibiotics. He has a small wound remaining on the right lateral leg several areas on the left leg including a larger area on the left posterior several left medial and anterior and a small wound on the left lateral. The area on the left dorsal third toe looks a lot better. ROS; Gen.; no fever, respiratory no cough no sputum Cardiac no chest pain other than this 10 point review of system is negative 10/30/17; patient arrives today having fallen in the bathtub 3 days ago. It took him a while to get up. He has pain and maceration in the wounds on his left leg which have deteriorated. He has not been using his pumps he also has some maceration on the right lateral leg. 11/03/17; patient continues to have weeping edema especially in the left leg. This saturates his dressings which were just put on on 12/27. As usual the doxycycline seems to take care of the cellulitis on his lower leg. He is not complaining of fever, chills, or other systemic symptoms. He states his leg feels a lot better on the doxycycline I gave him empirically. He also apparently gets injections at his primary doctor's officeo Rocephin for cellulitis prophylaxis. I didn't ask him about his compression pump compliance today I think that's probably marginal. Arrives in the clinic with all of his dressings primary and secondary macerated full of fluid and he has bilateral edema 11/10/17; the patient's right leg looks some better although there is still a cluster of wounds on the right lateral. The  left leg is inflamed with almost circumferential skin loss medially to laterally although we are still maintaining anteriorly. He does not have overt cellulitis there is a lot of drainage. He is not using compression pumps. We have been using silver alginate to the wound areas, there are not a lot of options here 11/17/17; the patient's right leg continues to be stable although there is still open wounds, better than last week. The inflammation in the left leg is better. Still loss of surface layer epithelium especially posteriorly. There is no overt cellulitis in the amount of edema and his left leg is really quite good, tells me he is using his compression pumps once a day. 11/24/17; patient's right leg has a small superficial wound laterally this continues to improve. The inflammation in the left leg is still improving however we have continuous surface layer epithelial loss posteriorly. There is no overt cellulitis in the amount of edema in both legs is really quite good. He states he is using his compression pumps on the left leg once a day for 5 out of 7 days 12/01/17; very small superficial areas on the right lateral leg continue to improve. Edema control in both legs is better today. He has continued loss of surface epithelialization and left posterior calf although I think this is better. We have been using silver alginate with large number of absorptive secondary dressings 4 layer on the left Unna boot on the right at his request. He tells me he is using his compression pumps once a day 12/08/17; he has no open area on the right leg is edema control is good here.   ooOn the left leg however he has marked erythema and tenderness breakdown of skin. He has what appears to be a wrap injury just distal to the popliteal fossa. This is the pattern of his recurrent cellulitis area and he apparently received penicillin at his primary physician's office really worked in my view but usually response to  doxycycline given it to him several times in the past 12/15/17; the patient had already deteriorated last Friday when he came in for his nurse check. There was swelling erythema and breakdown in the right leg. He has much worse skin breakdown in the left leg as well multiple open areas medially and posteriorly as well as laterally. He tells me he has been using his compression pumps but tells me he feels that the drainage out of his leg is worse when he uses a compression pumps. T be fair to him he is been saying this o for a while however I don't know that I have really been listening to this. I wonder if the compression pumps are working properly 12/22/17;. Once again he arrives with severe erythema, weeping edema from the left greater than right leg. Noncompliance with compression pumps. New this visit he is complaining of pain on the lateral aspect of the right leg and the medial aspect of his right thigh. He apparently saw his cardiologist Dr. [name] who was ordered an echocardiogram area and I think this is a step in the right direction 12/25/17; started his doxycycline Monday night. There is still intense erythema of the right leg especially in the anterior thigh although there is less tenderness. The erythema around the wound on the right lateral calf also is less tender. He still complaining of pain in the left heel. His wounds are about the same right lateral left medial left lateral. Superficial but certainly not close to closure. He denies being systemically unwell no fever chills no abdominal pain no diarrhea 12/29/17; back in follow-up of his extensive right calf and right thigh cellulitis. I added amoxicillin to cover possible doxycycline resistant strep. This seems to of done the trick he is in much less pain there is much less erythema and swelling. He has his echocardiogram at 11:00 this morning. X-ray of the left heel was also negative. 01/05/18; the patient arrived with his edema under  much better control. Now that he is retired he is able to use his compression pumps daily and sometimes twice a day per the patient. He has a wound on the right leg the lateral wound looks better. Area on the left leg also looks a lot better. He has no evidence of cellulitis in his bilateral thighs I had a quick peak at his echocardiogram. He is in normal ejection fraction and normal left ventricular function. He has moderate pulmonary hypertension moderately reduced right ventricular function. One would have to wonder about chronic sleep apnea although he says he doesn't snore. He'll review the echocardiogram with his cardiologist. 01/12/18; the patient arrives with the edema in both legs under exemplary control. He is using his compression pumps daily and sometimes twice daily. His wound on the right lateral leg is just about closed. He still has some weeping areas on the posterior left calf and lateral left calf although everything is just about closed here as well. I have spoken with Aldean Baker who is the patient's nurse practitioner and infectious disease. She was concerned that the patient had not understood that the parenteral penicillin injections he was receiving for cellulitis prophylaxis  was actually benefiting him. I don't think the patient actually saw that I would tend to agree we were certainly dealing with less infections although he had a serious one last month. 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 He is healed. He'll be placed in juxtalite compression wraps and increase his lymphedema pumps to twice daily. We will follow up again next week to ensure there are no issues with the new regiment. 01/20/18-he is here for evaluation of bilateral lower extremity weeping edema. Yesterday he was placed in compression wrap to the right lower extremity and compression stocking to left lower shrubbery. He states he uses lymphedema pumps last night and  again this morning and noted a blister to the left lower extremity. On exam he was noted to have drainage to the right lower extremity. He will be placed in Unna boots bilaterally and follow-up next week 01/26/18; patient was actually discharged a week ago to his own juxta light stockings only to return the next day with bilateral lower extremity weeping edema.he was placed in bilateral Unna boots. He arrives today with pain in the back of his left leg. There is no open area on the right leg however there is a linear/wrap injury on the left leg and weeping edema on the left leg posteriorly. I spoke with infectious disease about 10 days ago. They were disappointed that the patient elected to discontinue prophylactic intramuscular penicillin shots as they felt it was particularly beneficial in reducing the frequency of his cellulitis. I discussed this with the patient today. He does not share this view. He'll definitely need antibiotics today. Finally he is traveling to [place] and trauma leaving this Saturday and returning a week later and he does not travel with his pumps. He is going by car 01/30/18; patient was seen 4 days ago and brought back in today for review of cellulitis in the left leg posteriorly. I put him on amoxicillin this really hasn't helped as much as I might like. He is also worried because he is traveling to [facility] trauma by car. Finally we will be rewrapping him. There is no open area on the right leg over his left leg has multiple weeping areas as usual 02/09/18; The same wrap on for 10 days. He did not pick up the last doxycycline I prescribed for him. He apparently took 4 days worth he already had. There is nothing open on his right leg and the edema control is really quite good. He's had damage in the left leg medially and laterally especially probably related to the prolonged use of Unna boots 02/12/18; the patient arrived in clinic today for a nurse visit/wrap change. He  complained of a lot of pain in the left posterior calf. He is taking doxycycline that I previously prescribed for him. Unfortunately even though he used his stockings and apparently used to compression pumps twice a day he has weeping edema coming out of the lateral part of his right leg. This is coming from the lower anterior lateral skin area. 02/16/18; the patient has finished his doxycycline and will finish the amoxicillin 2 days. The area of cellulitis in the left calf posteriorly has resolved. He is no longer having any pain. He tells me he is using his compression pumps at least once a day sometimes twice. 02/23/18; the patient finished his doxycycline and Amoxil last week. On Friday he noticed a small erythematous circle about the size of a quarter on the left lower  leg just above his ankle. This rapidly expanded and he now has erythema on the lateral and posterior part of the thigh. This is bright red. Also has an area on the dorsal foot just above his toes and a tender area just below the left popliteal fossa. He came off his prophylactic penicillin injections at his own insistence one or 2 months ago. This is obviously deteriorated since then 03/02/18; patient is on doxycycline and Amoxil. Culture I did last week of the weeping area on the back of his left calf grew group B strep. I have therefore renewed the amoxicillin 500 3 times a day for a further week. He has not been systemically unwell. Still complaining of an area of discomfort right under his left popliteal fossa. There is no open wound on the right leg. He tells me that he is using his pumps twice a day on most days 03/09/18; patient arrives in clinic today completing his amoxicillin today. The cellulitis on his left leg is better. Furthermore he tells me that he had intramuscular penicillin shots that his primary care office today. However he also states that the wrap on his right leg fell down shortly after leaving clinic last week.  He developed a large blister that was present when he came in for a nurse visit later in the week and then he developed intense discomfort around this area.He tells me he is using his compression pumps 03/16/18; the patient has completed his doxycycline. The infectious part of this/cellulitis in the left heel area left popliteal area is a lot better. He has 2 open areas on the right calf. Still areas on the left calf but this is a lot better as well. 03/24/18; the patient arrives complaining of pain in the left popliteal area again. He thinks some of this is wrap injury. He has no open area on the right leg and really no open area on the left calf either except for the popliteal area. He claims to be compliant with the compression pumps 03/31/18; I gave him doxycycline last week because of cellulitis in the left popliteal area. This is a lot better although the surface epithelium is denuded off and response to this. He arrives today with uncontrolled edema in the right calf area as well as a fingernail injury in the right lateral calf. There is only a few open areas on the left 04/06/18; I gave him amoxicillin doxycycline over the last 2 weeks that the amoxicillin should be completing currently. He is not complaining of any pain or systemic symptoms. The only open areas see has is on the right lateral lower leg paradoxically I cannot see anything on the left lower leg. He tells me he is using his compression pumps twice a day on most days. Silver alginate to the wounds that are open under 4 layer compression 04/13/18; he completed antibiotics and has no new complaints. Using his compression pumps. Silver alginate that anything that's opened 04/20/18; he is using his compression pumps religiously. Silver alginate 4 layer compression anything that's opened. He comes in today with no open wounds on the left leg but 3 on the right including a new one posteriorly. He has 2 on the right lateral and one on the  right posterior. He likes Unna boots on the right leg for reasons that aren't really clear we had the usual 4 layer compression on the left. It may be necessary to move to the 4 layer compression on the right however for now  I left them in the Unna boots 04/27/18; he is using his compression pumps at least once a day. He has still the wounds on the right lateral calf. The area right posteriorly has closed. He does not have an open wound on the left under 4 layer compression however on the dorsal left foot just proximal to the toes and the left third toe 2 small open areas were identified 05/11/18; he has not uses compression pumps. The areas on the right lateral calf have coalesced into one large wound necrotic surface. On the left side he has one small wound anteriorly however the edema is now weeping out of a large part of his left leg. He says he wasn't using his pumps because of the weeping fluid. I explained to him that this is the time he needs to pump more 05/18/18; patient states he is using his compression pumps twice a day. The area on the right lateral large wound albeit superficial. On the left side he has innumerable number of small new wounds on the left calf particularly laterally but several anteriorly and medially. All these appear to have healthy granulated base these look like the remnants of blisters however they occurred under compression. The patient arrives in clinic today with his legs somewhat better. There is certainly less edema, less multiple open areas on the left calf and the right anterior leg looks somewhat better as well superficial and a little smaller. However he relates pain and erythema over the last 3-4 days in the thigh and I looked at this today. He has not been systemically unwell no fever no chills no change in blood sugar values 05/25/18; comes in today in a better state. The severe cellulitis on his left leg seems better with the Keflex. Not as tender. He has not  been systemically unwell ooHard to find an open wound on the left lower leg using his compression pumps twice a day ooThe confluent wounds on his right lateral calf somewhat better looking. These will ultimately need debridement I didn't do this today. 06/01/18; the severe cellulitis on the left anterior thigh has resolved and he is completed his Keflex. ooThere is no open wound on the left leg however there is a superficial excoriation at the base of the third toe dorsally. Skin on the bottom of his left foot is macerated looking. ooThe left the wounds on the lateral right leg actually looks some better although he did require debridement of the top half of this wound area with an open curet 06/09/18 on evaluation today patient appears to be doing poorly in regard to his right lower extremity in particular this appears to likely be infected he has very thick purulent discharge along with a bright green tent to the discharge. This makes me concerned about the possibility of pseudomonas. He's also having increased discomfort at this point on evaluation. Fortunately there does not appear to be any evidence of infection spreading to the other location at this time. 06/16/18 on evaluation today patient appears to actually be doing fairly well. His ulcer has actually diminished in size quite significantly at this point which is good news. Nonetheless he still does have some evidence of infection he did see infectious disease this morning before coming here for his appointment. I did review the results of their evaluation and their note today. They did actually have him discontinue the Cipro and initiate treatment with linezolid at this time. He is doing this for the next seven days and  they recommended a follow-up in four months with them. He is the keep a log of the need for intermittent antibiotic therapy between now and when he falls back up with infectious disease. This will help them gaze what exactly  they need to do to try and help them out. 06/23/18; the patient arrives today with no open wounds on the left leg and left third toe healed. He is been using his compression pumps twice a day. On the right lateral leg he still has a sizable wound but this is a lot better than last time I saw this. 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 Has been using silver alginate under 4 layer compression 06/30/18; the only open wound he has is on the right lateral leg and this looks healthy. No debridement is required. We have been using silver alginate. He does not have an open wound on the left leg. There is apparently some drainage from the dorsal proximal third toe on the left although I see no open wound here. 07/03/18 on evaluation today patient was actually here just for a nurse visit rapid change. However when he was here on Wednesday for his rat change due to having been healed on the left and then developing blisters we initiated the wrap again knowing that he would be back today for [place] to reevaluate and see were at. Unfortunately he has developed some cellulitis into the proximal portion of his right lower extremity even into the region of his thigh. 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 He was placed on one as what at that point. Nonetheless he is done with that and has been tolerating it well otherwise. Doxycycline which in the past really did not seem to be effective for him. Nonetheless I think the best option may be for [place] to definitely reinitiate the antibiotics for a longer period of time. 07/07/18; since I last saw this patient a week ago he has had a difficult time. At that point he did not have an open wound on his left leg. We transitioned him into juxta light stockings. He was apparently in the clinic the next day with blisters on the left  lateral and left medial lower calf. He also had weeping edema fluid. He was put back into a compression wrap. He was also in the clinic on Friday with intense erythema in his right thigh. Per the patient he was started on Bactrim however that didn't work at all in terms of relieving his pain and swelling. He has taken 3 doxycycline that he had left over from last time and that seems to of helped. He has blistering on the right thigh as well. 07/14/18; the erythema on his right thigh has gotten better with doxycycline that he is finishing. 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 Presumably this cellulitis in the thigh was not related to that although I think this is a bit concerning going forward. He still has an area on the right lateral calf the blister on the right medial calf just below the knee that was discussed above. On the left 2 small open areas left medial and left lateral. Edema control is adequate. He is using his compression pumps twice a day 07/20/18; continued improvement in the  condition of both legs especially the edema in his bilateral thighs. He tells me he is been losing weight through a combination of diet and exercise. He is using his compression pumps twice a day. So overall she made to the remaining wounds 07/27/2018; continued improvement in condition of both legs. His edema is well controlled. The area on the right lateral leg is just about closed he had one blisters show up on the medial left upper calf. We have him in 4 layer compression. He is going on a 10-day trip to [place], [place] and [place]. He will be driving. He wants to wear Unna boots because of the lessening amount of constriction. He will not use compression pumps while he is away 08/05/18 on evaluation today patient actually appears to be doing decently well all things considered in regard to his bilateral lower extremities. The  worst ulcer is actually only posterior aspect of his left lower extremity with a four layer compression wrap cut into his leg a couple weeks back. He did have a trip and actually had [employer] for the trip that he is worn since he was last here. Nonetheless he feels like the [employer] actually do better for him his swelling is up a little bit but he also with his trip was not taking his Lasix on a regular set schedule like he was supposed to be. He states that obviously the reason being that he cannot drive and keep going without having to urinate too frequently which makes it difficult. He did not have his pumps with him while he was away either which I think also maybe playing a role here too. 08/13/2018; the patient only has a small open wound on the right lateral calf which is a big improvement in the last month or 2. He also has the area posteriorly just below the posterior fossa on the left which I think was a wrap injury from several weeks ago. He has no current evidence of cellulitis. He tells me he is back into his compression pumps twice a day. He also tells me that while he was at the laundromat somebody stole a section of his extremitease stockings 08/20/2018; back in the clinic with a much improved state. He only has small areas on the right lateral mid calf which is just about healed. This was is more substantial area for quite a prolonged period of time. He has a small open area on the left anterior tibia. The area on the posterior calf just below the popliteal fossa is closed today. He is using his compression pumps twice a day 08/28/2018; patient has no open wound on the right leg. He has a smattering of open areas on the calf with some weeping lymphedema. More problematically than that it looks as though his wraps of slipped down in his usual he has very angry upper area of edema just below the right medial knee and on the right lateral calf. He has no open area on his  feet. The patient is traveling to [facility] next week. I will send him in an antibiotic. We will continue to wrap the right leg. We ordered extremitease stockings for him last week and I plan to transition the right leg to a stocking when he gets home which will be in 10 days time. As usual he is very reluctant to take his pumps with him when he travels 09/07/2018; patient returns from [facility]. He shows me a picture  of his left leg in the mid part of his trip last week with intense fire engine erythema. The picture look bad enough I would have considered sending him to the hospital. Instead he went to the wound care center in [facility]. They did not prescribe him antibiotics but he did take some doxycycline he had leftover from a previous visit. I had given him trimethoprim sulfamethoxazole before he left this did not work according to the patient. This is resulted in some improvement fortunately. He comes back with a large wound on the left posterior calf. Smaller area on the left anterior tibia. Denuded blisters on the dorsal left foot over his toes. Does not have much in the way of wounds on the right leg although he does have a very tender area on the right posterior area just below the popliteal fossa also suggestive of infection. He promises me he is back on his pumps twice a day 09/15/2018; the intense cellulitis in his left lower calf is a lot better. The wound area on the posterior left calf is also so better. However he has reasonably extensive wounds on the dorsal aspect of his second and third toes and the proximal foot just at the base of the toes. There is nothing open on the right leg 09/22/2018; the patient has excellent edema control in his legs bilaterally. He is using his external compression pumps twice a day. He has no open area on the right leg and only the areas in the left foot dorsally second and third toe area on the left side. He does not have any signs of  active cellulitis. 10/06/2018; the patient has good edema control bilaterally. He has no open wound on the right leg. There is a blister in the posterior aspect of his left calf that we had to deal with today. He is using his compression pumps twice a day. There is no signs of active cellulitis. We have been using silver alginate to the wound areas. He still has vulnerable areas on the base of his left first second toes dorsally He has a his extremities stockings and we are going to transition him today into the stocking on the right leg. He is cautioned that he will need to continue to use the compression pumps twice a day. If he notices uncontrolled edema in the right leg he may need to go to 3 times a day. 10/13/2018; the patient came in for a nurse check on Friday he has a large flaccid blister on the right medial calf just below the knee. We unroofed this. He has this and a new area underneath the posterior mid calf which was undoubtedly a blister as well. He also has several small areas on the right which is the area we put his extremities stocking on. 10/19/2018; the patient went to see infectious disease this morning I am not sure if that was a routine follow-up in any case the doxycycline I had given him was discontinued and started on linezolid. He has not started this. It is easy to look at his left calf and the inflammation and think this is cellulitis however he is very tender in the tissue just below the popliteal fossa and I have no doubt that there is infection going on here. He states the problem he is having is that with the compression pumps the edema goes down and then starts walking the wrap falls down. We will see if we can adhere this. He has 1  or 2 minuscule open areas on the right still areas that are weeping on the posterior left calf, the base of his left second and third toes 10/26/18; back today in clinic with quite of skin breakdown in his left anterior leg. This may have  been infection the area below the popliteal fossa seems a lot better however tremendous epithelial loss on the left anterior mid tibia area over quite inexpensive tissue. He has 2 blisters on the right side but no other open wound here. 10/29/2018; came in urgently to see [place] today and we worked him in for review. He states that the 4 layer compression on the right leg caused pain he had to cut it down to roughly his mid calf this caused swelling above the wrap and he has blisters and skin breakdown today. As a result of the pain he has not been using his pumps. Both legs are a lot more edematous and there is a lot of weeping fluid. 11/02/18; arrives in clinic with continued difficulties in the right leg> left. Leg is swollen and painful. multiple skin blisters and new open areas especially laterally. He has not been using his pumps on the right leg. 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 He is not systemically unwell. 11/09/2018; the patient claims he is being compliant with his pumps. He is finished the doxycycline I gave him last week. Culture I did of the wound on the right lateral leg showed a few very resistant methicillin staph aureus. This was resistant to doxycycline. Nevertheless he states the pain in the leg is a lot better which makes me wonder if the cultured organism was not really what was causing the problem nevertheless this is a very dangerous organism to be culturing out of any wound. His right leg is still a lot larger than the left. He is using an Radio broadcast assistant on this area, he blames a 4-layer compression for causing the original skin breakdown which I doubt is true however I cannot talk him out of it. We have been using silver alginate to all of these areas which were initially blisters 11/16/2018; patient is being compliant with his external compression pumps at twice a day. Miraculously he arrives in clinic today with absolutely no  open wounds. He has better edema control on the left where he has been using 4 layer compression versus wound of wounds on the right and I pointed this out to him. There is no inflammation in the skin in his lower legs which is also somewhat unusual for him. There is no open wounds on the dorsal left foot. He has extremitease stockings at home and I have asked him to bring these in next week. 11/25/18 patient's lower extremity on examination today on the left appears for the most part to be wound free. He does have an open wound on the lateral aspect of the right lower extremity but this is minimal compared to what I've seen in past. He does request that we go ahead and wrap the left leg as well even though there's nothing open just so hopefully it will not reopen in short order. 1/28; patient has superficial open wounds on the right lateral calf left anterior calf and left posterior calf. His edema control is adequate. 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 We have been using silver alginate as the primary dressing.  He claims compliance with his compression pumps 2/4; patient has superficial open wounds on numerous areas of his left calf and again one on the left dorsal foot. The areas on the right lateral calf have healed. The cellulitis that I gave him doxycycline for last week is also resolved this was mostly on the left anterior calf just below the tibial tuberosity. His edema looks fairly well-controlled. He tells me he went to see his primary doctor today and had blood work ordered 2/11; once again he has several open areas on the left calf left tibial area. Most of these are small and appear to have healthy granulation. He does not have anything open on the right. The edema and control in his thighs is pretty good which is usually a good indication he has been using his pumps as requested. 2/18; he  continues to have several small areas on the left calf and left tibial area. Most of these are small healthy granulation. We put him in his stocking on the right leg last week and he arrives with a superficial open area over the right upper tibia and a fairly large area on the right lateral tibia in similar condition. His edema control actually does not look too bad, he claims to be using his compression pumps twice a day 2/25. Continued small areas on the left calf and left tibial area. New areas especially on the right are identified just below the tibial tuberosity and on the right upper tibia itself. There are also areas of weeping edema fluid even without an obvious wound. He does not have a considerable degree of lymphedema but clearly there is more edema here than his skin can handle. He states he is using the pumps twice a day. We have an Unna boot on the right and 4 layer compression on the left. 3/3; he continues to have an area on the right lateral calf and right posterior calf just below the popliteal fossa. There is a fair amount of tenderness around the wound on the popliteal fossa but I did not see any evidence of cellulitis, could just be that the wrap came down and rubbed in this area. ooHe does not have an open area on the left leg however there is an area on the left dorsal foot at the base of the third toe ooWe have been using silver alginate to all wound areas 3/10; he did not have an open area on his left leg last time he was here a week ago. T oday he arrives with a horizontal wound just below the tibial tuberosity and an area on the left lateral calf. He has intense erythema and tenderness in this area. The area is on the right lateral calf and right posterior calf better than last week. We have been using silver alginate as usual 3/18 - Patient returns with 3 small open areas on left calf, and 1 small open area on right calf, the skin looks ok with no significant erythema, he  continues the UNA boot on right and 4 layer compression on left. The right lateral calf wound is closed , the right posterior is small area. we will continue silver alginate to the areas. 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 He is been tolerating the dressing changes without complication. Fortunately she has  made excellent improvement in regard to the overall status of his wounds. Unfortunately every time we cease wrapping him he ends up reopening in causing more significant issues at that point. Again I'm unsure of the best direction to take although I think the lymphedema clinic may be appropriate for him. 02/03/19 on evaluation today patient appears to be doing well in regard to the wounds that we saw him for last week unfortunately he has a new area on the proximal portion of his right medial/posterior lower extremity where the wrap somewhat slowed down and caused swelling and a blister to rub and open. Unfortunately this is the only opening that he has on either leg at this point. 02/17/19 on evaluation today patient's bilateral lower extremities appear to be doing well. He still completely healed in regard to the left lower extremity. In regard to the right lower extremity the area where the wrap and slid down and caused the blister still seems to be slightly open although this is dramatically better than during the last evaluation two weeks ago. I'm very pleased with the way this stands overall. 03/03/19 on evaluation today patient appears to be doing well in regard to his right lower extremity in general although he did have a new blister open this does not appear to be showing any evidence of active infection at this time. Fortunately there's No fevers, chills, nausea, or vomiting noted at this  time. Overall I feel like he is making good progress it does feel like that the right leg will we perform the [employer] seems to do with a bit better than three layer wrap on the left which slid down on him. We may switch to doing bilateral in the book wraps. 5/4; I have not seen Mr. Kemmer in quite some time. According to our case manager he did not have an open wound on his left leg last week. He had 1 remaining wound on the right posterior medial calf. He arrives today with multiple openings on the left leg probably were blisters and/or wrap injuries from Unna boots. I do not think the Unna boot's will provide adequate compression on the left. I am also not clear about the frequency he is using the compression pumps. 03/17/19 on evaluation today patient appears to be doing excellent in regard to his lower extremities compared to last week's evaluation apparently. He had gotten significantly worse last week which is unfortunate. The [employer] wrap on the left did not seem to do very well for him at all and in fact it didn't control his swelling significantly enough he had an additional outbreak. Subsequently we go back to the four layer compression wrap on the left. This is good news. At least in that he is doing better and the wound seem to be killing him. He still has not heard anything from the lymphedema clinic. 03/24/19 on evaluation today patient actually appears to be doing much better in regard to his bilateral lower Trinity as compared to last week when I saw him. Fortunately there's no signs of active infection at this time. He has been tolerating the dressing changes without complication. Overall I'm extremely pleased with the progress and appearance in general. 04/07/19 on evaluation today patient appears to be doing well in regard to his bilateral lower extremities. His swelling is significantly down from where it was previous. With that being said he does have a couple blisters still open  at this point but fortunately nothing that seems to  be too severe and again the majority of the larger openings has healed at this time. 04/14/19 on evaluation today patient actually appears to be doing quite well in regard to his bilateral lower extremities in fact I'm not even sure there's anything significantly open at this time at any site. Nonetheless he did have some trouble with these wraps where they are somewhat irritating him secondary to the fact that he has noted that the graph wasn't too close down to the end of this foot in a little bit short as well up to his knee. Otherwise things seem to be doing quite well. 04/21/19 upon evaluation today patient's wound bed actually showed evidence of being completely healed in regard to both lower extremities which is excellent news. There does not appear to be any signs of active infection which is also good news. I'm very pleased in this regard. No fevers, chills, nausea, or vomiting noted at this time. 04/28/19 on evaluation today patient appears to be doing a little bit worse in regard to both lower extremities on the left mainly due to the fact that when he went infection disease the wrap was not wrapped quite high enough he developed a blister above this. On the right he is a small open area of nothing too significant but again this is continuing to give him some trouble he has been were in the Velcro compression that he has at home. 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 He's been tolerating the dressing changes without complication. Fortunately there's no signs of active infection at this time. No fevers, chills, nausea, or vomiting noted at this time. We have been trying to get an appointment with her lymphedema clinic in [facility] but unfortunately nobody can get them on phone with not been able to even fax information over the patient likewise is not been  able to get in touch with them. Overall I'm not sure exactly what's going on here with to reach out again today. 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 Still having a lot of drainage unfortunately. He tells me especially in the left but even on the right. There's no signs of active infection which is good news we've been using so ratcheted up to this point. 05/19/19 on evaluation today patient actually appears to be doing quite well with regard to his left lower extremity which is great news. Fortunately in regard to the right lower extremity has an issues with his wrap and he subsequently did remove this from what I'm understanding. Nonetheless long story short is what he had rewrapped once he removed it subsequently had maggots underneath this wrap whenever he came in for evaluation today. With that being said they were obviously completely cleaned away by the nursing staff. The visit today which is excellent news. However he does appear to potentially have some infection around the right ankle region where the maggots were located as well. He will likely require anabiotic therapy today. 05/26/19 on evaluation today patient actually appears to be doing much better in regard to his bilateral lower extremities. I feel like the infection is under much better control. With that being said there were maggots noted when the wrap was removed yet again today. Again this could have potentially been left over from previous although at this time there does not appear to be any signs  of significant drainage there was obviously on the wrap some drainage as well this contracted gnats or otherwise. Either way I do not see anything that appears to be doing worse in my pinion and in fact I think his drainage has slowed down quite significantly likely mainly due to the fact to his infection being under better control. 06/02/2019 on  evaluation today patient actually appears to be doing well with regard to his bilateral lower extremities there is no signs of active infection at this time which is great news. With that being said he does have several open areas more so on the right than the left but nonetheless these are all significantly better than previously noted. 06/09/2019 on evaluation today patient actually appears to be doing well. His wrap stayed up and he did not cause any problems he had more drainage on the right compared to the left but overall I do not see any major issues at this time which is great news. 06/16/2019 on evaluation today patient appears to be doing excellent with regard to his lower extremities the only area that is open is a new blister that can have opened as of today on the medial ankle on the left. Other than this he really seems to be doing great I see no major issues at this point. 06/23/2019 on evaluation today patient appears to be doing quite well with regard to his bilateral lower extremities. In fact he actually appears to be almost completely healed there is a small area of weeping noted of the right lower extremity just above the ankle. Nonetheless fortunately there is no signs of active infection at this time which is good news. No fevers, chills, nausea, vomiting, or diarrhea. 8/24; the patient arrived for a nurse visit today but complained of very significant pain in the left leg and therefore I was asked to look at this. Noted that he did not have an open area on the left leg last week nevertheless this was wrapped. The patient states that he is not been able to put his compression pumps on the left leg because of the discomfort. He has not been systemically unwell 06/30/2019 on evaluation today patient unfortunately despite being excellent last week is doing much worse with regard to his left lower extremity today. In fact he had to come in for a nurse on Monday where his left leg had to  be rewrapped due to excessive weeping Dr. Leanord Hawking placed him on doxycycline at that point. Fortunately there is no signs of active infection Systemically at this time which is good news. 07/07/2019 in regard to the patient's wounds today he actually seems to be doing well with his right lower extremity there really is nothing open or draining at this point this is great news. Unfortunately the left lower extremity is given him additional trouble at this time. There does not appear to be any signs of active infection nonetheless he does have a lot of edema and swelling noted at this point as well as blistering all of which has led to a much more poor appearing leg at this time compared to where it was 2 weeks ago when it was almost completely healed. Obviously this is a little discouraging for the patient. He is try to contact the lymphedema clinic in [place] he has not been able to get through to them. 07/14/2019 on evaluation today patient actually appears to be doing slightly better with regard to his left lower extremity ulcers. Overall I do  feel like at least at the top of the wrap that we have been placing this area has healed quite nicely and looks much better. The remainder of the leg is showing signs of improvement. Unfortunately in the thigh area he still has an open region on the left and again on the right he has been utilizing just a Band-Aid on an area that also opened on the thigh. Again this is an area that were not able to wrap although we did do an Ace wrap to provide some compression that something that obviously is a little less effective than the compression wraps we have been using on the lower portion of the leg. He does have an appointment with the lymphedema clinic in [facility] on Friday. 07/21/2019 on evaluation today patient appears to be doing better with regard to his lower extremity ulcers. He has been tolerating the dressing changes without complication. Fortunately  there is no signs of active infection at this time. No fevers, chills, nausea, vomiting, or diarrhea. I did receive the paperwork from the physical therapist at the lymphedema clinic in [place]. Subsequently I signed off on that this morning and sent that back to him for further progression with the treatment plan. 07/28/2019 on evaluation today patient appears to be doing very well with regard to his right lower extremity where I do not see any open wounds at this point. Fortunately he is feeling great as far as that is concerned as well. In regard to the left lower extremity he has been having issues with still several areas of weeping and edema although the upper leg is doing better his lower leg still I think is going require the compression wrap at this time. No fevers, chills, nausea, vomiting, or diarrhea. 08/04/2019 on evaluation today patient unfortunately is having new wounds on the right lower extremity. Again we have been using Unna boot wrap on that side. We switched him to using his juxta lite wrap at home. With that being said he tells me he has been using it although his legs extremely swollen and to be honest really does not appear that he has been. I cannot know that for sure however. Nonetheless he has multiple new wounds on the right lower extremity at this time. Obviously we will have to see about getting this rewrapped for him today. 08/11/2019 on evaluation today patient appears to be doing fairly well with regard to his wounds. He has been tolerating the dressing changes including the compression wraps without complication. He still has a lot of edema in his upper thigh regions bilaterally he is supposed to be seeing the lymphedema clinic on the 15th of this month once his wraps arrive for the upper part of his legs. 08/18/2019 on evaluation today patient appears to be doing well with regard to his bilateral lower extremities at this point. He has been tolerating the  dressing changes without complication. Fortunately there is no signs of active infection which is also good news. He does have a couple weeping areas on the first and second toe of the right foot he also has just a small area on the left foot upper leg and a small area on the left lower leg but overall he is doing quite well in my opinion. He is supposed to be getting his wraps shortly in fact tomorrow and then subsequently is seeing the lymphedema clinic next Wednesday on the 21st. Of note he is also leaving on the 25th to go on vacation for  a week to the beach. For that reason and since there is some uncertainty about what there can be doing at lymphedema clinic next Wednesday I am get a make an appointment for next Friday here for [place] to see what we need to do for him prior to him leaving for vacation. 10/23; patient arrives in considerable pain predominantly in the upper posterior calf just distal to the popliteal fossa also in the wound anteriorly above the major wound. This is probably cellulitis and he has had this recurrently in the past. He has no open wound on the right side and he has had an Radio broadcast assistant in that area. Finally I note that he has an area on the left posterior calf which by enlarge is mostly epithelialized. This protrudes beyond the borders of the surrounding skin in the setting of dry scaly skin and lymphedema. The patient is leaving for [facility] on Sunday. Per his longstanding pattern, he will not take his compression pumps with him predominantly out of fear that they will be stolen. He therefore asked that we put a Unna boot back on the right leg. He will also contact the wound care center in [name] to see if they can change his dressing in the mid week. 11/3; patient returned from his vacation to [facility]. He was seen on 1 occasion at their wound care center. They did a 2 layer compression system as they did not have our 4-layer wrap. I am not completely certain  what they put on the wounds. They did not change the Unna boot on the right. The patient is also seeing a lymphedema specialist physical therapist in [place]. It appears that he has some compression sleeve for his thighs which indeed look quite a bit better than I am used to seeing. He pumps over these with his external compression pumps. 11/10; the patient has a new wound on the right medial thigh otherwise there is no open areas on the right. He has an area on the left leg posteriorly anteriorly and medially and an area over the left second toe. We have been using silver alginate. He thinks the injury on his thigh is secondary to friction from the compression sleeve he has. 11/17; the patient has a new wound on the right medial thigh last week. He thinks this is because he did not have a underlying stocking for his thigh juxta lite apparatus. He now has this. The area is fairly large and somewhat angry but I do not think he has underlying cellulitis. ooHe has a intact blister on the right anterior tibial area. ooSmall wound on the right great toe dorsally ooSmall area on the medial left calf. 11/30; the patient does not have any open areas on his right leg and we did not take his juxta lite stocking off. However he states that on Friday his compression wrap fell down lodging around his upper mid calf area. As usual this creates a lot of problems for him. He called urgently today to be seen for a nurse visit however the nurse visit turned into a provider visit because of extreme erythema and pain in the left anterior tibia extending laterally and posteriorly. The area that is problematic is extensive 10/06/2019 upon evaluation today patient actually appears to be doing poorly in regard to his left lower extremity. He Dr. Leanord Hawking did place him on doxycycline this past Monday apparently due to the fact that he was doing much worse in regard to this left  leg. Fortunately the doxycycline does  seem to be helping. Unfortunately we are still having a very difficult time getting his edema under any type of control in order to anticipate discharge at some point. The only way were really able to control his lymphedema really is with compression wraps and that has only even seemingly temporary. He has been seeing a lymphedema clinic they are trying to help in this regard but still this has been somewhat frustrating in general for the patient. 10/13/19 on evaluation today patient appears to be doing excellent with regard to his right lower extremity as far as the wounds are concerned. His swelling is still quite extensive unfortunately. He is still having a lot of drainage from the thigh areas bilaterally which is unfortunate. He's been going to lymphedema clinic but again he still really does not have this edema under control as far as his lower extremities are concern. With regard to his left lower extremity this seems to be improving and I do believe the doxycycline has been of benefit for him. He is about to complete the doxycycline. 10/20/2019 on evaluation today patient appears to be doing poorly in regard to his bilateral lower extremities. More in the right thigh he has a lot of irritation at this site unfortunately. In regard to the left lower extremity the wrap was not quite as high it appears and does seem to have caused him some trouble as well. Fortunately there is no evidence of systemic infection though he does have some blue-green drainage which has me concerned for the possibility of Pseudomonas. He tells me he is previously taking Cipro without complications and he really does not care for Levaquin however due to some of the side effects he has. He is not allergic to any medications specifically antibiotics that were aware of. 10/27/2019 on evaluation today patient actually does appear to be for the most part doing better when compared to last week's evaluation. With that being  said he still has multiple open wounds over the bilateral lower extremities. He actually forgot to start taking the Cipro and states that he still has the whole bottle. He does have several new blisters on left lower extremity today I think I would recommend he go ahead and take the Cipro based on what I am seeing at this point. 12/30-Patient comes at 1 week visit, 4 layer compression wraps on the left and Unna boot on the right, primary dressing Xtrasorb and silver alginate. Patient is taking his Cipro and has a few more days left probably 5-6, and the legs are doing better. He states he is using his compressions devices which I believe he has 11/10/2019 on evaluation today patient actually appears to be much better than last time I saw him 2 weeks ago. His wounds are significantly improved and overall I am very pleased in this regard. Fortunately there is no signs of active infection at this time. He is just a couple of days away from completing Cipro. Overall his edema is much better he has been using his lymphedema pumps which I think is also helping at this point. 11/17/2019 on evaluation today patient appears to be doing excellent in regard to his wounds in general. His legs are swollen but not nearly as much as they have been in the past. Fortunately he is tolerating the compression wraps without complication. No fevers, chills, nausea, vomiting, or diarrhea. He does have some erythema however in the distal portion of his right lower extremity specifically  around the forefoot and toes there is a little bit of warmth here as well. 11/24/2019 on evaluation today patient appears to be doing well with regard to his right lower extremity I really do not see any open wounds at this point. His left lower extremity does have several open areas and his right medial thigh also is open. Other than this however overall the patient seems to be making good progress and I am very pleased at this point. 12/01/2019  on evaluation today patient appears to be doing poorly at this point in regard to his left lower extremity has several new blisters despite the fact that we have him in compression wraps. In fact he had a 4-layer compression wrap, his upper thigh wrapped from lymphedema clinic, and a juxta light over top of the 4 layer compression wrap the lymphedema clinic applied and despite all this he still develop blisters underneath. Obviously this does have me concerned about the fact that unfortunately despite what we are doing to try to get wounds healed he continues to have new areas arise I do not think he is ever good to be at the point where he can realistically just use wraps at home to keep things under control. Typically when we heal him it takes about 1-2 days before he is back in the clinic with severe breakdown and blistering of his lower extremities bilaterally. This is happened numerous times in the past. Unfortunately I think that we may need some help as far as overall fluid overload to kind of limit what we are seeing and get things under better control. 12/08/2019 on evaluation today patient presents for follow-up concerning his ongoing bilateral lower extremity edema. Unfortunately he is still having quite a bit of swelling the compression wraps are controlling this to some degree but he did see Dr. [name] his cardiologist I do have that available for review today as far as the appointment was concerned that was on 12/06/2019. Obviously that she has been 2 days ago. The patient states that he is only been taking the Lasix 80 mg 1 time a day he had told me previously he was taking this twice a day. Nonetheless Dr. [name] recommended this be up to 80 mg 2 times a day for the patient as he did appear to be fluid overloaded. With that being said the patient states he did this yesterday and he was unable to go anywhere or do anything due to the fact that he was constantly having to urinate. Nonetheless I  think that this is still good to be something that is important for him as far as trying to get his edema under control at all things that he is going to be able to just expect his wounds to get under control and things to be better without going through at least a period of time where he is trying to stabilize his fluid management in general and I think increasing the Lasix is likely the first step here. It was also mentioned the possibility that the patient may require metolazone. With that being said he wanted to have the patient take Lasix twice a day first and then reevaluating 2 months to see where things stand. 12/15/2019 upon evaluation today patient appears to be doing regard to his legs although his toes are showing some signs of weeping especially on the left at this point to some degree on the right. There does not appear to be any signs of active infection and overall  I do feel like the compression wraps are doing well for him but he has not been able to take the Lasix at home and the increased dose that Dr. [name] recommended. He tells me that just not go to be feasible for him. Nonetheless I think in this case he should probably send a message to Dr. [name] in order to discuss options from the standpoint of possible admission to get the fluid off or otherwise going forward. 12/22/2019 upon evaluation today patient appears to be doing fairly well with regard to his lower extremities at this point. In fact he would be doing excellent if it was not for the fact that his right anterior thigh apparently had an allergic reaction to adhesive tape that he used. The wound itself that we have been monitoring actually appears to be healed. There is a lot of irritation at this point. 12/29/2019 upon evaluation today patient appears to be doing well in regard to his lower extremities. His left medial thigh is open and somewhat draining today but this is the only region that is open the right has done much  better with the treatment utilizing the steroid cream that I prescribed for him last week. Overall I am pleased in that regard. Fortunately there is no signs of active infection at this time. No fevers, chills, nausea, vomiting, or diarrhea. 01/05/2020 upon evaluation today patient appears to be doing more poorly in regard to his right lower extremity at this point upon evaluation today. Unfortunately he continues to have issues in this regard and I think the biggest issue is controlling his edema. This obviously is not very well controlled at this point is been recommended that he use the Lasix twice a day but he has not been able to do that. Unfortunately I think this is leading to an issue where honestly he is not really able to effectively control his edema and therefore the wounds really are not doing significantly better. I do not think that he is going to be able to keep things under good control unless he is able to control his edema much better. I discussed this again in great detail with him today. 01/12/2020 good news is patient actually appears to be doing quite well today at this point. He does have an appointment with lymphedema clinic tomorrow. His legs appear healed and the toe on the left is almost completely healed. In general I am very pleased with how things stand at this point. 01/19/2020 upon evaluation today patient appears to actually be doing well in regard to his lower extremities there is nothing open at this point. Fortunately he has done extremely well more recently. Has been seeing lymphedema clinic as well. With that being said he has Velcro wraps for his lower legs as well as his upper legs. The only wound really is on his toe which is the right great toe and this is barely anything even there. With all that being said I think it is good to be appropriate today to go ahead and switch him over to the Velcro compression wraps. 01/26/2020 upon evaluation today patient appears to  be doing worse with regard to his lower extremities after last week switch him to Velcro compression wraps. Unfortunately he lasted less than 24 hours he did not have the sock portion of his Velcro wrap on the left leg and subsequently developed a blister underneath the Velcro portion. Obviously this is not good and not what we were looking for at this  point. He states the lymphedema clinic did tell him to wear the wrap for 23 hours and take him off for 1 I am okay with that plan but again right now we got a get things back under control again he may have some cellulitis noted as well. 02/02/2020 upon evaluation today patient unfortunately appears to have several areas of blistering on his bilateral lower extremities today mainly on the feet. His legs do seem to be doing somewhat better which is good news. Fortunately there is no evidence of active infection at this time. No fevers, chills, nausea, vomiting, or diarrhea. 02/16/2020 upon evaluation today patient appears to be doing well at this time with regard to his legs. He has a couple weeping areas on his toes but for the most part everything is doing better and does appear to be sealed up on his legs which is excellent news. We can continue with wrapping him at this point as he had every time we discontinue the wraps he just breaks out with new wounds. There is really no point in is going forward with this at this point. 03/08/2020 upon evaluation today patient actually appears to be doing quite well with regard to his lower extremity ulcers. He has just a very superficial and really almost nonexistent blister on the left lower extremity he has in general done very well with the compression wraps. With that being said I do not see any signs of infection at this time which is good news. 03/29/2020 upon evaluation today patient appears to be doing well with regard to his wounds currently except for where he had several new areas that opened up due to  some of the wrap slipping and causing him trouble. He states he did not realize they had slipped. Nonetheless he has a 1 area on the right and 3 new areas on the left. Fortunately there is no signs of active infection at this time which is great news. 04/05/2020 upon evaluation today patient actually appears to be doing quite well in general in regard to his legs currently. Fortunately there is no signs of active infection at this time. No fevers, chills, nausea, vomiting, or diarrhea. He tells me next week that he will actually be seen in the lymphedema clinic on Thursday at 10 AM I see him on Wednesday next week. 04/12/2020 upon evaluation today patient appears to be doing very well with regard to his lower extremities bilaterally. In fact he does not appear to have any open wounds at this point which is good news. Fortunately there is no signs of active infection at this time. No fevers, chills, nausea, vomiting, or diarrhea. 04/19/2020 upon evaluation today patient appears to be doing well with regard to his wounds currently on the bilateral lower extremities. There does not appear to be any signs of active infection at this time. Fortunately there is no evidence of systemic infection and overall very pleased at this point. Nonetheless after I held him out last week he literally had blisters the next morning already which swelled up with him being right back here in the clinic. Overall I think that he is just not can be able to be discharged with his legs the way they are he is much to volume overloaded as far as fluid is concerned and that was discussed with him today of also discussed this but should try the clinic nurse manager as well as Dr. Leanord Hawking. 04/26/2020 upon evaluation today patient appears to be doing better with  regard to his wounds currently. He is making some progress and overall swelling is under good control with the compression wraps. Fortunately there is no evidence of active infection  at this time. 05/10/2020 on evaluation today patient appears to be doing overall well in regard to his lower extremities bilaterally. He is Tolerating the compression wraps without complication and with what we are seeing currently I feel like that he is making excellent progress. There is no signs of active infection at this time. 05/24/2020 upon evaluation today patient appears to be doing well in regard to his legs. The swelling is actually quite a bit down compared to where it has been in the past. Fortunately there is no sign of active infection at this time which is also good news. With that being said he does have several wounds on his toes that have opened up at this point. 05/31/2020 upon evaluation today patient appears to be doing well with regard to his legs bilaterally where he really has no significant fluid buildup at this point overall he seems to be doing quite well. Very pleased in this regard. With regard to his toes these also seem to be drying up which is excellent. We have continue to wrap him as every time we tried as a transition to the juxta light wraps things just do not seem to get any better. 06/07/2020 upon evaluation today patient appears to be doing well with regard to his right leg at this point. Unfortunately left leg has a lot of blistering he tells me the wrap started to slide down on him when he tried to put his other Velcro wrap over top of it to help keep things in order but nonetheless still had some issues. 06/14/2020 on evaluation today patient appears to be doing well with regard to his lower extremity ulcers and foot ulcers at this point. I feel like everything is actually showing signs of improvement which is great news overall there is no signs of active infection at this time. No fevers, chills, nausea, vomiting, or diarrhea. 06/21/2020 on evaluation today patient actually appears to be doing okay in regard to his wounds in general. With that being said the biggest  issue I see is on his right foot in particular the first and second toe seem to be doing a little worse due to the fact this is staying very wet. I think he is probably getting need to change out his dressings a couple times in between each week when we see him in regard to his toes in order to keep this drier based on the location and how this is proceeding. 06/28/2020 on evaluation today patient appears to be doing a little bit more poorly overall in regard to the appearance of the skin I am actually somewhat concerned about the possibility of him having a little bit of an infection here. We discussed the course of potentially giving him a doxycycline prescription which he is taken previously with good result. With that being said I do believe that this is potentially mild and at this point easily fixed. I just do not want anything to get any worse. 07/12/2020 upon evaluation today patient actually appears to be making some progress with regard to his legs which is great news there does not appear to be any evidence of active infection. Overall very pleased with where things stand. 07/26/2020 upon evaluation today patient appears to be doing well with regard to his leg ulcers and toe ulcers at this  point. He has been tolerating the compression wraps without complication overall very pleased in this regard. 08/09/2020 upon evaluation today patient appears to be doing well with regard to his lower extremities bilaterally. Fortunately there is no signs of active infection overall I am pleased with where things stand. 08/23/2020 on evaluation today patient appears to be doing well with regard to his wound. He has been tolerating the dressing changes without complication. Fortunately there is no signs of active infection at this time. Overall his legs seem to be doing quite well which is great news and I am very pleased in that regard. No fevers, chills, nausea, vomiting, or diarrhea. 09/13/2020 upon  evaluation today patient appears to be doing okay in regard to his lower extremities. He does have a fairly large blister on the right leg which I did remove the blister tissue from today so we can get this to dry out other than that however he seems to be doing quite well. There is no signs of active infection at this time. 09/27/2020 upon evaluation today patient appears to actually be doing some better in regard to his right leg. Fortunately signs of active infection at this time which is great news. No fevers, chills, nausea, vomiting, or diarrhea. 10/04/2020 upon evaluation today patient actually appears to be showing signs of improvement which is great news with regard to his leg ulcers. Fortunately there is no signs of active infection which is great news he is still taking the antibiotics currently. No fevers, chills, nausea, vomiting, or diarrhea. 10/18/2020 on evaluation today patient appears to be doing well with regard to his legs currently. He has been tolerating the dressing changes including the wraps without complication. Fortunately there is no signs of active infection at this time. No fevers, chills, nausea, vomiting, or diarrhea. 10/25/2020 upon evaluation today patient appears to be doing decently well in regard to his wounds currently. He has been tolerating the dressing changes without complication. Overall I feel like he is making good progress albeit slow. Again this is something we can have to continue to wrap for some time to come most likely. 11/08/2020 upon evaluation today patient appears to be doing well with regard to his wounds currently. He has been tolerating the dressing changes without complication is not currently on any antibiotics and he does not appear to show any signs of infection. He does continue to have a lot of drainage on the right leg not too severe but nonetheless this is very scattered. On the left leg this is looking to be much improved  overall. 11/15/2020 upon evaluation today patient appears to be doing better with regard to his legs bilaterally. Especially the right leg which was much more significant last week. There does not appear to be any signs of active infection which is great news. No fevers, chills, nausea, vomiting, or diarrhea. 11/23/2019 upon evaluation today patient appears to be doing poorly still in regard to his lower extremities bilaterally. Unfortunately his right leg in particular appears to be doing much more poorly there is no signs really of infection this is not warm to touch but he does have a lot of drainage and weeping unfortunately. With that reason I do believe that we may need to initiate some treatment here to try to help calm down some of the swelling of the right leg. I think switching to a 4-layer compression wrap would be beneficial here. The patient is in agreement with giving this a try. 11/29/2020 upon evaluation  today patient appears to be doing well currently in regard to his leg ulcers. I feel like the right leg is doing better he still has a lot of drainage but we do see some improvement here. The 4-layer compression wrap I think was helpful. 12/06/2020 upon evaluation today patient appears to be doing well with regard to his legs. In fact they seem to be doing about the best I have seen up to this point. Fortunately there is no signs of active infection at this time. No fevers, chills, nausea, vomiting, or diarrhea. 12/20/2020 upon evaluation today patient appears to be doing well at this time in regard to his legs. He is not having any significant draining which is great news. Fortunately there is no signs of active infection at this time. No fevers, chills, nausea, vomiting, or diarrhea. Objective Constitutional Obese and well-hydrated in no acute distress. Vitals Time Taken: 10:30 AM, Height: 70 in, Weight: 380.2 lbs, BMI: 54.5, Temperature: 98.1 F, Pulse: 75 bpm, Respiratory Rate: 20  breaths/min, Blood Pressure: 156/74 mmHg, Capillary Blood Glucose: 158 mg/dl. Respiratory normal breathing without difficulty. Psychiatric this patient is able to make decisions and demonstrates good insight into disease process. Alert and Oriented x 3. pleasant and cooperative. General Notes: Upon inspection patient's wounds are showing signs of good epithelization. I do not see anything that significantly open at this point which is great he has very little drainage and moisture along his legs. I think we can discontinue the use altogether of the zinc at this point otherwise I think is just can take up a lot on his leg. Integumentary (Hair, Skin) Wound #185 status is Healed - Epithelialized. Original cause of wound was Blister. The wound is located on the Right,Circumferential Lower Leg. The wound measures 0cm length x 0cm width x 0cm depth; 0cm^2 area and 0cm^3 volume. Wound #191 status is Open. Original cause of wound was Gradually Appeared. The wound is located on the Left,Lateral,Dorsal Foot. The wound measures 1cm length x 0.5cm width x 0.1cm depth; 0.393cm^2 area and 0.039cm^3 volume. There is Fat Layer (Subcutaneous Tissue) exposed. There is no tunneling or undermining noted. There is a large amount of serous drainage noted. The wound margin is distinct with the outline attached to the wound base. There is large (67-100%) red, pink granulation within the wound bed. There is no necrotic tissue within the wound bed. Other Condition(s) Patient presents with Lymphedema located on the Right Leg. General Notes: edema, cobblestone appearance, dry flaky skin. 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 There was a Four Layer Compression Therapy Procedure by Fonnie Mu, RN. Post procedure Diagnosis Wound #191: Same as Pre-Procedure There was a Four Layer Compression Therapy Procedure by Fonnie Mu, RN. Post procedure Diagnosis Wound #: Same as Pre-Procedure Plan Follow-up Appointments: Return Appointment in 2 weeks. - MD Nurse Visit: - 1 week Bathing/ Shower/ Hygiene: May shower with protection but do not get wound dressing(s) wet. Edema Control - Lymphedema / SCD / Other: Lymphedema Pumps. Use Lymphedema pumps on leg(s) 2-3 times a day for 45-60 minutes. If  wearing any wraps or hose, do not remove them. Continue exercising as instructed. Elevate legs to the level of the heart or above for 30 minutes daily and/or when sitting, a frequency of: - throughout the day Avoid standing for long periods of time. Exercise regularly Non Wound Condition: Other Non Wound Condition Orders/Instructions: - lotion to leg, 4 layer compression wrap WOUND #191: - Foot Wound Laterality: Dorsal, Left, Lateral Peri-Wound Care: Zinc Oxide Ointment 30g tube 1 x Per Week/30 Days Discharge Instructions: Apply Zinc Oxide to periwound with each dressing change Peri-Wound Care: Sween Lotion (Moisturizing lotion) 1 x Per Week/30 Days Discharge Instructions: Apply moisturizing lotion to leg Prim Dressing: KerraCel Ag Gelling Fiber Dressing, 2x2 in (silver alginate) 1 x Per Week/30 Days ary Discharge Instructions: Apply silver alginate to wound bed as instructed Secondary Dressing: Woven Gauze Sponge, Non-Sterile 4x4 in 1 x Per Week/30 Days Discharge Instructions: Apply over primary dressing as directed. Secondary Dressing: ABD Pad, 5x9 1 x Per Week/30 Days Discharge  Instructions: Apply over primary dressing as directed. Com pression Wrap: FourPress (4 layer compression wrap) 1 x Per Week/30 Days Discharge Instructions: Apply four layer compression as directed. 1. Would recommend currently that we going to discontinue the use of the zinc as I believe he is not really draining enough to make this necessary currently. 2. I am also can recommend that we have the patient elevate his legs much possible try to keep edema under control. 3. I am also can recommend that we continue with the 4-layer compression wraps bilaterally this seems to be doing excellent for him. 4. He should also continue to use his lymphedema pumps that should be at least once a day but twice a day is preferable. We will see patient back for reevaluation in 1 week here in the clinic. If anything worsens or changes patient will contact our office for additional recommendations. This will be for nurse visit I will see him for a provider visit in 2 weeks. Electronic Signature(s) Signed: 12/20/2020 11:23:33 AM By: Lenda Kelp PA-C Signed: 12/20/2020 11:23:33 AM By: Lenda Kelp PA-C Entered By: Lenda Kelp on 12/20/2020 11:23:32 -------------------------------------------------------------------------------- SuperBill Details Patient Name: Date of Service: [name], Fortune J. 12/20/2020 Medical Record Number: [id number] Patient Account Number: [id number] Date of Birth/Sex: Treating RN: 06/30/1951 (70 y.o. [name] Primary Care Provider: Nicoletta Ba Other Clinician: Referring Provider: Treating Provider/Extender: Adele Dan in Treatment: 256 Diagnosis Coding ICD-10 Codes Code Description [phone] Non-pressure chronic ulcer of right calf limited to breakdown of skin L97.221 Non-pressure chronic ulcer of left calf limited to breakdown of skin 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 of bilateral lower  extremity I89.0 Lymphedema, not elsewhere classified E11.622 Type 2 diabetes mellitus with other skin ulcer 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, unspecified L03.116 Cellulitis of left lower limb Facility Procedures CPT4: Code [id number] Description: 81 BILATERAL: Application of multi-layer venous compression system; leg (below knee), including ankle and t. 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

## 2020-12-22 NOTE — Progress Notes (Signed)
SAGAN, MASELLI ([id number]) Visit Report for 12/20/2020 Arrival Information Details Patient Name: Date of Service: [name], [name] 12/20/2020 10:30 A M Medical Record Number: [id number] Patient Account Number: [id number] Date of Birth/Sex: Treating RN: 09/04/1951 (69 y.o. [name] Primary Care Nickoli [name]: [name] Other Clinician: Referring Karyn Brull: Treating Jahnavi Muratore/Extender: [name] in Treatment: 256 Visit Information History Since Last Visit Added or deleted any medications: No Patient Arrived: [name] Any new allergies or adverse reactions: No Arrival Time: 10:35 Had a fall or experienced change in No Accompanied By: self activities of daily living that may affect Transfer Assistance: None risk of falls: Patient Identification Verified: Yes Signs or symptoms of abuse/neglect since No Secondary Verification Process Completed: Yes last visito Patient Requires Transmission-Based Precautions: No Hospitalized since last visit: No Patient Has Alerts: Yes Implantable device outside of the clinic No excluding cellular tissue based products placed in the center since last visit: Has Dressing in Place as Prescribed: Yes Has Compression in Place as Prescribed: Yes Has Footwear/Offloading in Place as Yes Prescribed: Left: Surgical Shoe with Pressure Relief Insole Right: Surgical Shoe with Pressure Relief Insole Pain Present Now: No Electronic Signature(s) Signed: 12/20/2020 5:36:42 PM By: Deon Pilling Entered By: Deon Pilling on 12/20/2020 10:44:09 -------------------------------------------------------------------------------- Compression Therapy Details Patient Name: Date of Service: [name], [name] 12/20/2020 10:30 A M Medical Record Number: [id number] Patient Account Number: [id number] Date of Birth/Sex: Treating RN: 03-Apr-1951 (70 y.o. [name] Primary Care Miraj Truss: [name] Other Clinician: Referring  Shree Espey: Treating Sneha Willig/Extender: [name] in Treatment: 256 Compression Therapy Performed for Wound Assessment: Wound #191 Left,Lateral,Dorsal Foot Performed By: Clinician Rhae Hammock, RN Compression Type: Four Layer Post Procedure Diagnosis Same as Pre-procedure Electronic Signature(s) Signed: 12/20/2020 6:01:36 PM By: Baruch Gouty RN, BSN Entered By: Baruch Gouty on 12/20/2020 11:04:24 -------------------------------------------------------------------------------- Compression Therapy Details Patient Name: Date of Service: [name], [name] 12/20/2020 10:30 A M Medical Record Number: [id number] Patient Account Number: [id number] Date of Birth/Sex: Treating RN: [birth year] (70 y.o. [name] Primary Care Rahcel Shutes: [name] Other Clinician: Referring Rastus Borton: Treating Raphaela Cannaday/Extender: [name] in Treatment: 256 Compression Therapy Performed for Wound Assessment: NonWound Condition Lymphedema - Right Leg Performed By: Clinician Rhae Hammock, RN Compression Type: Four Layer Post Procedure Diagnosis Same as Pre-procedure Electronic Signature(s) Signed: 12/20/2020 6:01:36 PM By: Baruch Gouty RN, BSN Entered By: Baruch Gouty on 12/20/2020 11:04:50 -------------------------------------------------------------------------------- Encounter Discharge Information Details Patient Name: Date of Service: [facility], [name] 12/20/2020 10:30 A M Medical Record Number: [id number] Patient Account Number: [id number] Date of Birth/Sex: Treating RN: July 11, 1951 (70 y.o. [name], [name] Primary Care [place]: [name] Other Clinician: Referring Talullah Abate: Treating [name]/Extender: [name] in Treatment: 256 Encounter Discharge Information Items Discharge Condition: Stable Ambulatory Status: Ambulatory Discharge Destination: Home Transportation: Private  Auto Accompanied By: self Schedule Follow-up Appointment: Yes Clinical Summary of Care: Patient Declined Electronic Signature(s) Signed: 12/22/2020 5:09:47 PM By: Rhae Hammock RN Entered By: Rhae Hammock on 12/20/2020 11:12:13 -------------------------------------------------------------------------------- Lower Extremity Assessment Details Patient Name: Date of Service: [name] [place], [name] 12/20/2020 10:30 A M Medical Record Number: [id number] Patient Account Number: [id number] Date of Birth/Sex: Treating RN: 1950/12/01 (70 y.o. [name] Primary Care Makenzee Choudhry: [name] Other Clinician: Referring Shalamar Crays: Treating Horacio Werth/Extender: [name] in Treatment: 256 Edema Assessment Assessed: [name]: Yes] [name]: Yes] Edema: [Left: Yes] [Right: Yes] Calf Left: Right: Point of Measurement:  25 cm From Medial Instep 40 cm 39.5 cm Ankle Left: Right: Point of Measurement: 9 cm From Medial Instep 27.5 cm 28 cm Electronic Signature(s) Signed: 12/20/2020 5:36:42 PM By: Deon Pilling Entered By: Deon Pilling on 12/20/2020 10:45:32 -------------------------------------------------------------------------------- Multi-Disciplinary Care Plan Details Patient Name: Date of Service: [name], [name] 12/20/2020 10:30 A M Medical Record Number: [id number] Patient Account Number: [id number] Date of Birth/Sex: Treating RN: 02-26-1951 (70 y.o. [name] Primary Care Ellinor Test: [name] Other Clinician: Referring Merek Niu: Treating Arrow Tomko/Extender: [name] in Treatment: 256 Active Inactive Venous Leg Ulcer Nursing Diagnoses: Actual venous Insuffiency (use after diagnosis is confirmed) Goals: Patient will maintain optimal edema control Date Initiated: 09/10/2016 Target Resolution Date: 01/17/2021 Goal Status: Active Verify adequate tissue perfusion prior to therapeutic compression application Date  Initiated: 09/10/2016 Date Inactivated: 11/28/2016 Goal Status: Met Interventions: Assess peripheral edema status every visit. Compression as ordered Provide education on venous insufficiency Notes: Electronic Signature(s) Signed: 12/20/2020 6:01:36 PM By: Baruch Gouty RN, BSN Entered By: Baruch Gouty on 12/20/2020 11:03:20 -------------------------------------------------------------------------------- Non-Wound Condition Assessment Details Patient Name: Date of Service: [name], [name] 12/20/2020 10:30 A M Medical Record Number: [id number] Patient Account Number: [id number] Date of Birth/Sex: Treating RN: 26-Sep-1951 (70 y.o. [name] Primary Care Chaska Hagger: [name] Other Clinician: Referring Ary Lavine: Treating Demone Lyles/Extender: [name] in Treatment: 256 Non-Wound Condition: Condition: Lymphedema Location: Leg Side: Right Notes edema, cobblestone appearance, dry flaky skin. Electronic Signature(s) Signed: 12/20/2020 5:36:42 PM By: Deon Pilling Entered By: Deon Pilling on 12/20/2020 10:47:56 -------------------------------------------------------------------------------- Pain Assessment Details Patient Name: Date of Service: [name], [name]. 12/20/2020 10:30 A M Medical Record Number: [id number] Patient Account Number: [id number] Date of Birth/Sex: Treating RN: 1951-08-03 (70 y.o. [name] Primary Care Annaelle Kasel: [name] Other Clinician: Referring Gayanne Prescott: Treating Dory Demont/Extender: [name] in Treatment: 256 Active Problems Location of Pain Severity and Description of Pain Patient Has Paino No Site Locations Rate the pain. Current Pain Level: 0 Pain Management and Medication Current Pain Management: Medication: No Cold Application: No Rest: No Massage: No Activity: No T.E.N.S.: No Heat Application: No Leg drop or elevation: No Is the Current Pain Management  Adequate: Adequate How does your wound impact your activities of daily livingo Sleep: No Bathing: No Appetite: No Relationship With Others: No Bladder Continence: No Emotions: No Bowel Continence: No Work: No Toileting: No Drive: No Dressing: No Hobbies: No Electronic Signature(s) Signed: 12/20/2020 5:36:42 PM By: Deon Pilling Entered By: Deon Pilling on 12/20/2020 10:45:13 -------------------------------------------------------------------------------- Patient/Caregiver Education Details Patient Name: Date of Service: [name], [name] 2/16/2022andnbsp10:30 A M Medical Record Number: [id number] Patient Account Number: [id number] Date of Birth/Gender: Treating RN: July 01, 1951 (70 y.o. [name] Primary Care Physician: [name] Other Clinician: Referring Physician: Treating Physician/Extender: [name] in Treatment: 256 Education Assessment Education Provided To: Patient Education Topics Provided Venous: Methods: Explain/Verbal Responses: Reinforcements needed, State content correctly Electronic Signature(s) Signed: 12/20/2020 6:01:36 PM By: Baruch Gouty RN, BSN Entered By: Baruch Gouty on 12/20/2020 11:03:43 -------------------------------------------------------------------------------- Wound Assessment Details Patient Name: Date of Service: [name], [name] 12/20/2020 10:30 A M Medical Record Number: [id number] Patient Account Number: [id number] Date of Birth/Sex: Treating RN: 1951/04/21 (70 y.o. [name] Primary Care Eleonora Peeler: [name] Other Clinician: Referring Kohlton Gilpatrick: Treating Danialle Dement/Extender: [name] in Treatment: 256 Wound Status Wound Number: 185 Primary Etiology: Venous Leg Ulcer Wound Location: Right, Circumferential  Lower Leg Secondary Etiology: Lymphedema Wounding Event: Blister Wound Status: Healed - Epithelialized Date Acquired: 09/13/2020 Weeks  Of Treatment: 13 Clustered Wound: Yes Photos Photo Uploaded By: Mikeal Hawthorne on 12/21/2020 09:06:29 Wound Measurements Length: (cm) Width: (cm) Depth: (cm) Area: (cm) Volume: (cm) 0 % Reduction in Area: 100% 0 % Reduction in Volume: 100% 0 0 0 Wound Description Classification: Full Thickness Without Exposed Support Structur es Treatment Notes Wound #185 (Lower Leg) Wound Laterality: Right, Circumferential Cleanser Peri-Wound Care Topical Primary Dressing Secondary Dressing Secured With Compression Wrap Compression Stockings Add-Ons Electronic Signature(s) Signed: 12/20/2020 5:36:42 PM By: Deon Pilling Entered By: Deon Pilling on 12/20/2020 10:47:26 -------------------------------------------------------------------------------- Wound Assessment Details Patient Name: Date of Service: [name], CLINARD 12/20/2020 10:30 A M Medical Record Number: [id number] Patient Account Number: [id number] Date of Birth/Sex: Treating RN: 1951/01/11 (70 y.o. [name], [name] Primary Care Zeniyah Peaster: [name] Other Clinician: Referring Maron Stanzione: Treating Gaudencio Chesnut/Extender: [name] in Treatment: 256 Wound Status Wound Number: 191 Primary Lymphedema Etiology: Wound Location: Left, Lateral, Dorsal Foot Secondary Diabetic Wound/Ulcer of the Lower Extremity Wounding Event: Gradually Appeared Etiology: Date Acquired: 12/20/2020 Wound Open Weeks Of Treatment: 0 Status: Clustered Wound: No Comorbid Chronic sinus problems/congestion, Arrhythmia, Hypertension, History: Peripheral Arterial Disease, Type II Diabetes, History of Burn, Gout, Confinement Anxiety Photos Photo Uploaded By: Mikeal Hawthorne on 12/21/2020 09:06:49 Wound Measurements Length: (cm) 1 Width: (cm) 0.5 Depth: (cm) 0.1 Area: (cm) 0.393 Volume: (cm) 0.039 % Reduction in Area: % Reduction in Volume: Epithelialization: None Tunneling: No Undermining: No Wound  Description Classification: Full Thickness Without Exposed Support Structu Wound Margin: Distinct, outline attached Exudate Amount: Large Exudate Type: Serous Exudate Color: amber res Foul Odor After Cleansing: No Slough/Fibrino No Wound Bed Granulation Amount: Large (67-100%) Exposed Structure Granulation Quality: Red, Pink Fascia Exposed: No Necrotic Amount: None Present (0%) Fat Layer (Subcutaneous Tissue) Exposed: Yes Tendon Exposed: No Muscle Exposed: No Joint Exposed: No Bone Exposed: No Treatment Notes Wound #191 (Foot) Wound Laterality: Dorsal, Left, Lateral Cleanser Peri-Wound Care Zinc Oxide Ointment 30g tube Discharge Instruction: Apply Zinc Oxide to periwound with each dressing change Sween Lotion (Moisturizing lotion) Discharge Instruction: Apply moisturizing lotion to leg Topical Primary Dressing KerraCel Ag Gelling Fiber Dressing, 2x2 in (silver alginate) Discharge Instruction: Apply silver alginate to wound bed as instructed Secondary Dressing Woven Gauze Sponge, Non-Sterile 4x4 in Discharge Instruction: Apply over primary dressing as directed. ABD Pad, 5x9 Discharge Instruction: Apply over primary dressing as directed. Secured With Compression Wrap FourPress (4 layer compression wrap) Discharge Instruction: Apply four layer compression as directed. Compression Stockings Add-Ons Electronic Signature(s) Signed: 12/20/2020 5:36:42 PM By: Deon Pilling Signed: 12/20/2020 5:36:42 PM By: Deon Pilling Entered By: Deon Pilling on 12/20/2020 10:47:12 -------------------------------------------------------------------------------- Vitals Details Patient Name: Date of Service: [name], [name] 12/20/2020 10:30 A M Medical Record Number: [id number] Patient Account Number: [id number] Date of Birth/Sex: Treating RN: 06/25/1951 (70 y.o. [name] Primary Care Amaal Dimartino: [name] Other Clinician: Referring Isabelle Matt: Treating Joane Postel/Extender: [name] in Treatment: 256 Vital Signs Time Taken: 10:30 Temperature (F): 98.1 Height (in): 70 Pulse (bpm): 75 Weight (lbs): 380.2 Respiratory Rate (breaths/min): 20 Body Mass Index (BMI): 54.5 Blood Pressure (mmHg): 156/74 Capillary Blood Glucose (mg/dl): 158 Reference Range: 80 - 120 mg / dl Electronic Signature(s) Signed: 12/20/2020 5:36:42 PM By: Deon Pilling Entered By: Deon Pilling on 12/20/2020 10:44:28

## 2020-12-27 ENCOUNTER — Encounter (HOSPITAL_BASED_OUTPATIENT_CLINIC_OR_DEPARTMENT_OTHER): Payer: Medicare Other | Admitting: Physician Assistant

## 2020-12-27 ENCOUNTER — Other Ambulatory Visit: Payer: Self-pay

## 2020-12-27 DIAGNOSIS — E1140 Type 2 diabetes mellitus with diabetic neuropathy, unspecified: Secondary | ICD-10-CM | POA: Diagnosis not present

## 2020-12-27 DIAGNOSIS — L03116 Cellulitis of left lower limb: Secondary | ICD-10-CM | POA: Diagnosis not present

## 2020-12-27 DIAGNOSIS — L97211 Non-pressure chronic ulcer of right calf limited to breakdown of skin: Secondary | ICD-10-CM | POA: Diagnosis not present

## 2020-12-27 DIAGNOSIS — I890 Lymphedema, not elsewhere classified: Secondary | ICD-10-CM | POA: Diagnosis not present

## 2020-12-27 DIAGNOSIS — L97221 Non-pressure chronic ulcer of left calf limited to breakdown of skin: Secondary | ICD-10-CM | POA: Diagnosis not present

## 2020-12-27 DIAGNOSIS — E11622 Type 2 diabetes mellitus with other skin ulcer: Secondary | ICD-10-CM | POA: Diagnosis not present

## 2020-12-27 DIAGNOSIS — I87333 Chronic venous hypertension (idiopathic) with ulcer and inflammation of bilateral lower extremity: Secondary | ICD-10-CM | POA: Diagnosis not present

## 2020-12-27 NOTE — Progress Notes (Signed)
RISHON, THILGES ([id number]) Visit Report for 12/27/2020 SuperBill Details Patient Name: Date of Service: [name], [name] 12/27/2020 Medical Record Number: [id number] Patient Account Number: [id number] Date of Birth/Sex: Treating RN: 1951/07/02 (70 y.o. [name], [name] Primary Care Provider: Nicoletta Ba Other Clinician: Referring Provider: Treating Provider/Extender: [name] in Treatment: 257 Diagnosis Coding ICD-10 Codes Code Description [phone] Non-pressure chronic ulcer of right calf limited to breakdown of skin L97.221 Non-pressure chronic ulcer of left calf limited to breakdown of skin 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 of bilateral lower extremity I89.0 Lymphedema, not elsewhere classified E11.622 Type 2 diabetes mellitus with other skin ulcer 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, unspecified L03.116 Cellulitis of left lower limb Facility Procedures CPT4 Description Modifier Quantity Code [id number] BILATERAL: Application of multi-layer venous compression system; leg (below knee), including ankle and 1 foot. Electronic Signature(s) Signed: 12/27/2020 5:17:36 PM By: Shawn Stall Signed: 12/27/2020 6:06:11 PM By: Lenda Kelp PA-C Entered By: Shawn Stall on 12/27/2020 11:54:44

## 2020-12-27 NOTE — Progress Notes (Signed)
[name], [name] ([id number]) Visit Report for 12/27/2020 Arrival Information Details Patient Name: Date of Service: Kevin [name], [name] 12/27/2020 10:30 A M Medical Record Number: [id number] Patient Account Number: [id number] Date of Birth/Sex: Treating RN: 1951/05/20 (70 y.o. [name] Primary Care Provider: Nicoletta Ba Other Clinician: Referring Provider: Treating Provider/Extender: Adele Dan in Treatment: 257 Visit Information History Since Last Visit Added or deleted any medications: No Patient Arrived: [name] Any new allergies or adverse reactions: No Arrival Time: 10:25 Had a fall or experienced change in No Accompanied By: self activities of daily living that may affect Transfer Assistance: None risk of falls: Patient Identification Verified: Yes Signs or symptoms of abuse/neglect since last visito No Secondary Verification Process Completed: Yes Hospitalized since last visit: No Patient Requires Transmission-Based Precautions: No Implantable device outside of the clinic excluding No Patient Has Alerts: Yes cellular tissue based products placed in the center since last visit: Has Dressing in Place as Prescribed: Yes Pain Present Now: No Electronic Signature(s) Signed: 12/27/2020 10:45:54 AM By: Karl Ito Entered By: Karl Ito on 12/27/2020 10:26:05 -------------------------------------------------------------------------------- Compression Therapy Details Patient Name: Date of Service: [name], [name] 12/27/2020 10:30 A M Medical Record Number: [id number] Patient Account Number: [id number] Date of Birth/Sex: Treating RN: 04-May-1951 (70 y.o. [name] Primary Care Provider: Nicoletta Ba Other Clinician: Referring Provider: Treating Provider/Extender: Adele Dan in Treatment: 257 Compression Therapy Performed for Wound Assessment: Wound #191 Left,Lateral,Dorsal Foot Performed By:  Clinician Shawn Stall, RN Compression Type: Four Layer Electronic Signature(s) Signed: 12/27/2020 5:17:36 PM By: Shawn Stall Entered By: Shawn Stall on 12/27/2020 11:52:55 -------------------------------------------------------------------------------- Compression Therapy Details Patient Name: Date of Service: [name], [name] 12/27/2020 10:30 A M Medical Record Number: [id number] Patient Account Number: [id number] Date of Birth/Sex: Treating RN: September 18, 1951 (70 y.o. [name] Primary Care Provider: Other Clinician: Nicoletta Ba Referring Provider: Treating Provider/Extender: Adele Dan in Treatment: 257 Compression Therapy Performed for Wound Assessment: NonWound Condition Lymphedema - Right Leg Performed By: Clinician Shawn Stall, RN Compression Type: Four Layer Electronic Signature(s) Signed: 12/27/2020 5:17:36 PM By: Shawn Stall Entered By: Shawn Stall on 12/27/2020 11:53:37 -------------------------------------------------------------------------------- Encounter Discharge Information Details Patient Name: Date of Service: [name], [name] 12/27/2020 10:30 A M Medical Record Number: [id number] Patient Account Number: [id number] Date of Birth/Sex: Treating RN: 06/08/1951 (70 y.o. [name] Primary Care Provider: Nicoletta Ba Other Clinician: Referring Provider: Treating Provider/Extender: Adele Dan in Treatment: [phone] Encounter Discharge Information Items Discharge Condition: Stable Ambulatory Status: Walker Discharge Destination: Home Transportation: Private Auto Accompanied By: self Schedule Follow-up Appointment: Yes Clinical Summary of Care: Electronic Signature(s) Signed: 12/27/2020 5:17:36 PM By: Shawn Stall Entered By: Shawn Stall on 12/27/2020 11:54:36 -------------------------------------------------------------------------------- Patient/Caregiver Education  Details Patient Name: Date of Service: [name], [name] 2/23/2022andnbsp10:30 A M Medical Record Number: [id number] Patient Account Number: [id number] Date of Birth/Gender: Treating RN: 1951-07-16 (70 y.o. [name] Primary Care Physician: Nicoletta Ba Other Clinician: Referring Physician: Treating Physician/Extender: Adele Dan in Treatment: 257 Education Assessment Education Provided To: Patient Education Topics Provided Venous: Handouts: Controlling Swelling with Compression Stockings Methods: Explain/Verbal Responses: Reinforcements needed Electronic Signature(s) Signed: 12/27/2020 5:17:36 PM By: Shawn Stall Entered By: Shawn Stall on 12/27/2020 11:54:20 -------------------------------------------------------------------------------- Wound Assessment Details Patient Name: Date of Service: [name], [name] 12/27/2020 10:30 A M Medical Record Number: [id number] Patient Account Number: [id number] Date of Birth/Sex: Treating RN:  Aug 22, 1951 (69 y.o. [name] Primary Care Provider: Nicoletta Ba Other Clinician: Referring Provider: Treating Provider/Extender: Adele Dan in Treatment: 257 Wound Status Wound Number: 191 Primary Etiology: Lymphedema Wound Location: Left, Lateral, Dorsal Foot Secondary Etiology: Diabetic Wound/Ulcer of the Lower Extremity Wounding Event: Gradually Appeared Wound Status: Open Date Acquired: 12/20/2020 Weeks Of Treatment: 1 Clustered Wound: No Wound Measurements Length: (cm) 1 Width: (cm) 0.5 Depth: (cm) 0.1 Area: (cm) 0.393 Volume: (cm) 0.039 % Reduction in Area: 0% % Reduction in Volume: 0% Wound Description Classification: Full Thickness Without Exposed Support Structur es Treatment Notes Wound #191 (Foot) Wound Laterality: Dorsal, Left, Lateral Cleanser Peri-Wound Care Zinc Oxide Ointment 30g tube Discharge Instruction: Apply Zinc Oxide to  periwound with each dressing change Sween Lotion (Moisturizing lotion) Discharge Instruction: Apply moisturizing lotion to leg Topical Primary Dressing KerraCel Ag Gelling Fiber Dressing, 2x2 in (silver alginate) Discharge Instruction: Apply silver alginate to wound bed as instructed Secondary Dressing Woven Gauze Sponge, Non-Sterile 4x4 in Discharge Instruction: Apply over primary dressing as directed. ABD Pad, 5x9 Discharge Instruction: Apply over primary dressing as directed. Secured With Compression Wrap FourPress (4 layer compression wrap) Discharge Instruction: Apply four layer compression as directed. Compression Stockings Add-Ons Electronic Signature(s) Signed: 12/27/2020 10:45:54 AM By: Karl Ito Signed: 12/27/2020 5:21:35 PM By: Zenaida Deed RN, BSN Entered By: Karl Ito on 12/27/2020 10:26:55 -------------------------------------------------------------------------------- Vitals Details Patient Name: Date of Service: [name], [name] 12/27/2020 10:30 A M Medical Record Number: [id number] Patient Account Number: [id number] Date of Birth/Sex: Treating RN: [birth year] (70 y.o. [name] Primary Care Provider: Nicoletta Ba Other Clinician: Referring Provider: Treating Provider/Extender: Adele Dan in Treatment: 257 Vital Signs Time Taken: 10:26 Temperature (F): 98.3 Height (in): 70 Pulse (bpm): 76 Weight (lbs): 380.2 Respiratory Rate (breaths/min): 20 Body Mass Index (BMI): 54.5 Blood Pressure (mmHg): 145/61 Capillary Blood Glucose (mg/dl): 527 Reference Range: 80 - 120 mg / dl Electronic Signature(s) Signed: 12/27/2020 10:45:54 AM By: Karl Ito Entered By: Karl Ito on 12/27/2020 10:26:38

## 2020-12-28 ENCOUNTER — Encounter: Payer: Self-pay | Admitting: Family Medicine

## 2020-12-28 MED ORDER — HYDROCODONE-ACETAMINOPHEN 5-325 MG PO TABS: ORAL_TABLET | ORAL | 0 refills | Status: DC

## 2020-12-28 NOTE — Telephone Encounter (Signed)
Requesting: [employer] Contract: 04/12/20 UDS: n/a Last Visit:10/11/20 Next Visit:01/03/21 Last Refill:11/16/20(60,0)  Please Advise. Medication pending

## 2020-12-28 NOTE — Telephone Encounter (Signed)
Pt advised refill sent. °

## 2021-01-02 ENCOUNTER — Telehealth: Payer: Self-pay | Admitting: Family Medicine

## 2021-01-02 ENCOUNTER — Other Ambulatory Visit: Payer: Self-pay

## 2021-01-02 NOTE — Telephone Encounter (Signed)
Spoke with patient he stated to call back after 1pm today.  Called patient to schedule Annual Wellness Visit.  Please schedule with Nurse Health Advisor Thomasenia Sales, RN at [facility]

## 2021-01-03 ENCOUNTER — Encounter (HOSPITAL_BASED_OUTPATIENT_CLINIC_OR_DEPARTMENT_OTHER): Payer: Medicare Other | Attending: Physician Assistant | Admitting: Physician Assistant

## 2021-01-03 ENCOUNTER — Ambulatory Visit (INDEPENDENT_AMBULATORY_CARE_PROVIDER_SITE_OTHER): Payer: Medicare Other | Admitting: Family Medicine

## 2021-01-03 ENCOUNTER — Encounter: Payer: Self-pay | Admitting: Family Medicine

## 2021-01-03 VITALS — BP 146/68 | HR 61 | Temp 97.7°F | Resp 16 | Ht 69.0 in | Wt 385.6 lb

## 2021-01-03 DIAGNOSIS — I480 Paroxysmal atrial fibrillation: Secondary | ICD-10-CM

## 2021-01-03 DIAGNOSIS — L97821 Non-pressure chronic ulcer of other part of left lower leg limited to breakdown of skin: Secondary | ICD-10-CM | POA: Diagnosis not present

## 2021-01-03 DIAGNOSIS — I890 Lymphedema, not elsewhere classified: Secondary | ICD-10-CM | POA: Insufficient documentation

## 2021-01-03 DIAGNOSIS — I482 Chronic atrial fibrillation, unspecified: Secondary | ICD-10-CM | POA: Insufficient documentation

## 2021-01-03 DIAGNOSIS — I87333 Chronic venous hypertension (idiopathic) with ulcer and inflammation of bilateral lower extremity: Secondary | ICD-10-CM | POA: Diagnosis not present

## 2021-01-03 DIAGNOSIS — I110 Hypertensive heart disease with heart failure: Secondary | ICD-10-CM | POA: Insufficient documentation

## 2021-01-03 DIAGNOSIS — L03119 Cellulitis of unspecified part of limb: Secondary | ICD-10-CM | POA: Diagnosis not present

## 2021-01-03 DIAGNOSIS — E11621 Type 2 diabetes mellitus with foot ulcer: Secondary | ICD-10-CM | POA: Insufficient documentation

## 2021-01-03 DIAGNOSIS — E1149 Type 2 diabetes mellitus with other diabetic neurological complication: Secondary | ICD-10-CM | POA: Diagnosis not present

## 2021-01-03 DIAGNOSIS — I272 Pulmonary hypertension, unspecified: Secondary | ICD-10-CM | POA: Insufficient documentation

## 2021-01-03 DIAGNOSIS — Z794 Long term (current) use of insulin: Secondary | ICD-10-CM | POA: Diagnosis not present

## 2021-01-03 DIAGNOSIS — I10 Essential (primary) hypertension: Secondary | ICD-10-CM

## 2021-01-03 DIAGNOSIS — L97521 Non-pressure chronic ulcer of other 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 of skin: Secondary | ICD-10-CM | POA: Diagnosis not present

## 2021-01-03 DIAGNOSIS — E1140 Type 2 diabetes mellitus with diabetic neuropathy, unspecified: Secondary | ICD-10-CM | POA: Insufficient documentation

## 2021-01-03 DIAGNOSIS — N183 Chronic kidney disease, stage 3 unspecified: Secondary | ICD-10-CM | POA: Diagnosis not present

## 2021-01-03 DIAGNOSIS — E1151 Type 2 diabetes mellitus with diabetic peripheral angiopathy without gangrene: Secondary | ICD-10-CM | POA: Diagnosis not present

## 2021-01-03 DIAGNOSIS — L97211 Non-pressure chronic ulcer of right calf limited to breakdown of skin: Secondary | ICD-10-CM | POA: Insufficient documentation

## 2021-01-03 DIAGNOSIS — L97511 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 of skin: Secondary | ICD-10-CM | POA: Diagnosis not present

## 2021-01-03 DIAGNOSIS — E11622 Type 2 diabetes mellitus with other skin ulcer: Secondary | ICD-10-CM | POA: Insufficient documentation

## 2021-01-03 DIAGNOSIS — L97221 Non-pressure chronic ulcer of left calf limited to breakdown of skin: Secondary | ICD-10-CM | POA: Diagnosis not present

## 2021-01-03 DIAGNOSIS — I509 Heart failure, unspecified: Secondary | ICD-10-CM | POA: Insufficient documentation

## 2021-01-03 DIAGNOSIS — G4489 Other headache syndrome: Secondary | ICD-10-CM | POA: Diagnosis not present

## 2021-01-03 LAB — BASIC METABOLIC PANEL
BUN: 21 mg/dL (ref 6–23)
CO2: 30 mEq/L (ref 19–32)
Calcium: 8.7 mg/dL (ref 8.4–10.5)
Chloride: 101 mEq/L (ref 96–112)
Creatinine, Ser: 1.34 mg/dL (ref 0.40–1.50)
GFR: 53.96 mL/min — ABNORMAL LOW (ref >60.00)
Glucose, Bld: 150 mg/dL — ABNORMAL HIGH (ref 70–99)
Potassium: 4.6 mEq/L (ref 3.5–5.1)
Sodium: 139 mEq/L (ref 135–145)

## 2021-01-03 LAB — HEMOGLOBIN A1C: Hgb A1c MFr Bld: 7.8 % — ABNORMAL HIGH (ref 4.6–6.5)

## 2021-01-03 MED ORDER — BUTALBITAL-APAP-CAFF-COD 50-325-40-30 MG PO CAPS: ORAL_CAPSULE | ORAL | 0 refills | Status: DC

## 2021-01-03 NOTE — Progress Notes (Signed)
OFFICE VISIT  01/03/2021  CC:  Chief Complaint  Patient presents with  . Follow-up    [name], 3 mo. Pt is fasting    HPI:    Patient is a 70 y.o. Caucasian male who presents for 3 mo f/u DM, HTN, CRI III, and chronic a-fib. A/P as of last visit: "1) DM 2, control pretty good, adjusting 75/25 insulin per SS.  Metformin 1000 bid. Periodic feet exams at wound clinic are normal. A1c and BMET today.  He can't give urine specimen today so I'll do microalb/cr next f/u visit in 3 mo. No med changes at this time.  2) HTN: stable.  Cont lisin, dilt, metop, lasix. BMET today.  3) Chronic a-fib, rate controlled and anticoagulated. No signs of overt/occult bleeding. No med changes. BMET today.  4) Prev healthcare: Tdap booster next o/v. Next PSA due 07/2021. Colon ca scr: virtual colonoscopy NEG 2021.  Plan iFOB or cologuard 2022.  5) Chronic lymphedema, with recurrent cellulitis and venous stasis ulcers of lower legs--continue monthly pen G inj as per Inf Dz MD rec's-->given today. Cont lasix 80 qAM and 40 qPM.  6) CRI III: avoids NSAIDs.  Hydrates fine. Lytes/cr today."  INTERIM HX: Last visit his sCr went up and K went up so I cut back on his lisinopril and lasix and stopped his metformin.  Repeat testing a week later showed signif improvement (back to baseline) and I continued with these changes.  Turns out he got back on the metformin after last lab check anyway. Says glucoses fasting typically 140-180.  Typical insulin dose is 20 U.  Says home bp's typically <130/80.  Has not taken meds yet today. Says LE swelling unchanged even with taking a little less lasix since last visit.  Has HA syndrome for which fioricet helps, takes <60 tabs per year for this, has been doing this long term and it has been effective and he has consistently used this med responsibly, asks for RF today. PMP AWARE reviewed today: most recent rx for fioricet was filled 01/05/20, # 60, rx by me. Most  recent vicodin rx filled 12/30/20, #60, rx by me. No red flags.   Past Medical History:  Diagnosis Date  . ALLERGIC RHINITIS 08/11/2006  . ASTHMA 08/11/2006  . Chronic atrial fibrillation (HCC) 08/2014  . Chronic combined systolic and diastolic CHF (congestive heart failure) [facility](HCC)    Cardiology f/u 12/2017: pt volume overloaded (R heart dysf suspected), BNP very high, lasix increased.  Repeat echo 12/2017: normal LV EF, mild DD, +RV syst dysfxn, mod pulm HTN, biatrial enlgmt.  . Chronic constipation   . Chronic renal insufficiency, stage 2 (mild)    Borderline stage III (GFR 60s).  . DIABETES MELLITUS, TYPE II 08/11/2006  . DM W/RENAL MANIFESTATIONS, TYPE II 04/20/2007  . HYPERTENSION 08/11/2006  . Impaired mobility and endurance   . MRSA infection 06/2018   LL venous stasis ulcer infected  . Normocytic anemia 2016-2019   03/2018 B12 normal, iron ok (ferritin borderline low).  Plan repeat CBC at f/u summer 2019 and if decreased from baseline will check hemoccults and iron labs again.  . OBESITY, MORBID 12/14/2007   saxenda started 05/2020 by [facility] wt mgmt center  . OSA (obstructive sleep apnea)    to get sleep study with [facility]) as of 12/03/2018 consult.  . 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  [id number] ID started him on amoxil prophylaxis---ineffective.  End  2018/Jan 2019 penicillin G injections prophyl helpful but pt declined to continue this as of 01/2018 ID f/u.  ID talked him into resuming monthly penicillin G as of 02/2018 f/u.    Marland Kitchen Restless leg syndrome    Rx'd clonazepam 09/2017 and pt refused to take it after reading the medication's potential side effects.  . Venous stasis ulcers of both lower extremities (HCC)    Severe lymphedema.  wound clinic care ongoing as of 01/2018    Past Surgical History:  Procedure Laterality Date  . Carotid dopplers  07/23/2018   Left NORMAL.  Right 1-39% ICA  stenosis, with <50% distal CCA stenosis (not hemodynamically significant)  . TRANSTHORACIC ECHOCARDIOGRAM  11/2007; 09/2014; 11/2015;12/2017   LV fxn normal, EF normal, mild dilation of left atrium.  2015 grade II diast dysfxn.  2017 EF 55-60%. 2019: LVEF 60-65%, mild RV syst dysf,biatrial enlgmt, mod pulm htn.  Marland Kitchen URETERAL STENT PLACEMENT    . virtual colonoscopy  01/2011   Normal    Outpatient Medications Prior to Visit  Medication Sig Dispense Refill  . arginine 500 MG tablet Take by mouth.    Marland Kitchen b complex vitamins capsule Take 1 capsule by mouth every morning.    . cetirizine (ZYRTEC) 10 MG tablet Take 10 mg by mouth daily.    . CHROMIUM GTF PO Take by mouth daily.    . Coenzyme Q10 (CO Q 10) 10 MG CAPS Take by mouth. Reported on 02/19/2016    . diltiazem (TIAZAC) 240 MG 24 hr capsule TAKE 1 CAPSULE BY MOUTH  DAILY 90 capsule 1  . furosemide (LASIX) 40 MG tablet TAKE 2 TABLETS BY MOUTH TWO TIMES DAILY (Patient taking differently: Take 80 mg in the mornings and 40 mg in the evenings) 360 tablet 3  . Glutamine 500 MG CAPS Take by mouth.    Marland Kitchen HYDROcodone-acetaminophen (NORCO/VICODIN) 5-325 MG tablet 1-2 tabs po bid prn pain 60 tablet 0  . Insulin Lispro Prot & Lispro (HUMALOG MIX 75/25 KWIKPEN) (75-25) 100 UNIT/ML Kwikpen INJECT SUBCUTANEOUSLY 25  UNITS EVERY MORNING AND 20  UNITS EVERY EVENING AT  SUPPER 45 mL 3  . Insulin Pen Needle (BD ULTRA-FINE PEN NEEDLES) 29G X 12.7MM MISC USE TWICE DAILY 200 each 3  . lisinopril (ZESTRIL) 40 MG tablet Take 1/2 tablet(20mg ) by mouth daily. 90 tablet 1  . metFORMIN (GLUCOPHAGE) 1000 MG tablet TAKE 1 TABLET BY MOUTH  TWICE DAILY WITH MEALS 180 tablet 1  . metoprolol tartrate (LOPRESSOR) 50 MG tablet TAKE 1 AND 1/2 TABLETS BY  MOUTH TWICE DAILY 270 tablet 3  . Multiple Vitamin (MULTIVITAMIN) tablet Take 1 tablet by mouth daily.    [name] ULTRA test strip CHECK BLOOD SUGAR TWICE  DAILY 200 strip 3  . rivaroxaban (XARELTO) 20 MG TABS tablet TAKE 1 TABLET BY  MOUTH ONCE DAILY WITH SUPPER 90 tablet 3  . terazosin (HYTRIN) 10 MG capsule TAKE 1 CAPSULE BY MOUTH  ONCE DAILY AT BEDTIME 90 capsule 3  . vitamin E 400 UNIT capsule Take 400 Units by mouth every morning. Selenium 50mg     . butalbital-acetaminophen-caffeine (FIORICET WITH CODEINE) 50-325-40-30 MG capsule TAKE 1 TO 2 CAPSULES BY  MOUTH EVERY 6 HOURS AS  NEEDED FOR HEADACHE(S)  MANUFACTURER RECOMMENDS NOT EXCEEDING 6 CAPSULES/DAY 60 capsule 0   Facility-Administered Medications Prior to Visit  Medication Dose Route Frequency Provider Last Rate Last Admin  . penicillin g benzathine (BICILLIN LA) 1200000 UNIT/2ML injection 600,000 Units  600,000 Units Intramuscular Q30  days [name], MD   600,000 Units at 04/12/20 [phone]  . penicillin g benzathine (BICILLIN LA) 1200000 UNIT/2ML injection 600,000 Units  600,000 Units Intramuscular Q30 days [name], MD   600,000 Units at 04/12/20 0936  . penicillin g benzathine (BICILLIN LA) 1200000 UNIT/2ML injection 600,000 Units  600,000 Units Intramuscular Q30 days [name], MD   600,000 Units at 01/03/21 0900  . penicillin g benzathine (BICILLIN LA) 1200000 UNIT/2ML injection 600,000 Units  600,000 Units Intramuscular Q30 days [name], MD   600,000 Units at 01/03/21 5188    Allergies  Allergen Reactions  . Other Other (See Comments)    Sneezing, coughing    ROS As per HPI  PE: Vitals with BMI 01/03/2021 10/11/2020 07/12/2020  Height 5\' 9"  5\' 9"  -  Weight 385 lbs 10 oz 379 lbs 13 oz 380 lbs 10 oz  BMI 56.92 56.06 -  Systolic 146 127  Diastolic 68 62 63  Pulse 61 61 68     Gen: Alert, well appearing.  Patient is oriented to person, place, time, and situation. AFFECT: pleasant, lucid thought and speech. CV: RRR, distant S1 and S2, no audible m/r/g Both LL's wrapped in UNNA boots.  LABS:  Lab Results  Component Value Date   TSH 4.40 06/08/2018   Lab Results  Component Value Date   WBC 7.7 07/12/2020   HGB  12.7 (L) 07/12/2020   HCT 38.8 (L) 07/12/2020   MCV 88.9 07/12/2020   PLT 208.0 07/12/2020   Lab Results  Component Value Date   IRON 45 03/10/2018   FERRITIN 26.4 03/10/2018    Lab Results  Component Value Date   CREATININE 1.35 10/18/2020   BUN 16 10/18/2020   NA 139 10/18/2020   K 4.7 10/18/2020   CL 103 10/18/2020   CO2 29 10/18/2020   Lab Results  Component Value Date   ALT 10 07/12/2020   AST 14 07/12/2020   ALKPHOS 70 07/12/2020   BILITOT 1.1 07/12/2020   Lab Results  Component Value Date   CHOL 97 07/12/2020   Lab Results  Component Value Date   HDL 36.50 (L) 07/12/2020   Lab Results  Component Value Date   LDLCALC 49 07/12/2020   Lab Results  Component Value Date   TRIG 61.0 07/12/2020   Lab Results  Component Value Date   CHOLHDL 3 07/12/2020   Lab Results  Component Value Date   PSA 0.35 07/12/2020   PSA 0.71 07/06/2019   PSA 0.26 03/09/2018   Lab Results  Component Value Date   HGBA1C 7.6 (H) 10/11/2020    IMPRESSION AND PLAN:  1) DM, historically his control has been pretty good. Hba1c and lytes/cr today. He'll try to save his urine next visit so we can check microalb/cr. Cont humalog mix 75/25 per sliding scale bid and cont metformin but watching GFR. Pt says wt loss MD wants to add ozempic back on as long as GFR ok---he'll be seeing them again in April.  2) HTN: good control. Cont toprol xl 75mg  bid, 1/2 lisin 40mg  tab qd, dilt CD 240 qd. Lytes/cr today.  3) CRI III: avoids NSAIDs, tries to hydrate adequately. Is on lasix for LE edema and HF. Lytes/cr today.  4) A-fib: cont rate control with dilt and toprol xl and stroke prophylaxis with xarelto.  5) HA syndrome: we'll controlled/fioricet in very small amounts effective. RF'd #60 today.  6) Prev healthcare: Next PSA due 07/2021. Colon ca  scr: virtual colonoscopy NEG 2021.  Plan iFOB or cologuard 2022.  An After Visit Summary was printed and given to the  patient.  FOLLOW UP: Return in about 3 months (around 04/05/2021) for annual CPE (fasting) + [name].  Signed:  Santiago Bumpers, MD           01/03/2021

## 2021-01-04 NOTE — Progress Notes (Signed)
[name], [name] ([id number]) Visit Report for 01/03/2021 Arrival Information Details Patient Name: Date of Service: [name], [name] 01/03/2021 10:30 A M Medical Record Number: [id number] Patient Account Number: [id number] Date of Birth/Sex: Treating RN: 1951-03-18 (70 y.o. [name] Primary Care Deshanna Kama: Nicoletta Ba Other Clinician: Referring Henrry Feil: Treating Jermine Bibbee/Extender: Adele Kevin in Treatment: 258 Visit Information History Since Last Visit Added or deleted any medications: No Patient Arrived: [name] Any new allergies or adverse reactions: No Arrival Time: 10:33 Had a fall or experienced change in No Accompanied By: self activities of daily living that may affect Transfer Assistance: None risk of falls: Patient Identification Verified: Yes Signs or symptoms of abuse/neglect since last visito No Secondary Verification Process Completed: Yes Hospitalized since last visit: No Patient Requires Transmission-Based Precautions: No Implantable device outside of the clinic excluding No Patient Has Alerts: Yes cellular tissue based products placed in the center since last visit: Has Dressing in Place as Prescribed: Yes Pain Present Now: No Electronic Signature(s) Signed: 01/04/2021 5:30:41 PM By: Karl Ito Entered By: Karl Ito on 01/03/2021 10:33:47 -------------------------------------------------------------------------------- Compression Therapy Details Patient Name: Date of Service: [name], [name] 01/03/2021 10:30 A M Medical Record Number: [id number] Patient Account Number: [id number] Date of Birth/Sex: Treating RN: Apr 27, 1951 (70 y.o. [name] Primary Care Leiana Rund: Nicoletta Ba Other Clinician: Referring Sanaii Caporaso: Treating Aiman Noe/Extender: Adele Kevin in Treatment: 258 Compression Therapy Performed for Wound Assessment: Wound #191 Left,Lateral,Dorsal Foot Performed By:  Clinician Shawn Stall, RN Compression Type: Four Layer Electronic Signature(s) Signed: 01/04/2021 5:49:32 PM By: Shawn Stall Entered By: Shawn Stall on 01/03/2021 11:19:03 -------------------------------------------------------------------------------- Compression Therapy Details Patient Name: Date of Service: [name], [name] 01/03/2021 10:30 A M Medical Record Number: [id number] Patient Account Number: [id number] Date of Birth/Sex: Treating RN: [birth year] (70 y.o. [name] Primary Care Keylon Labelle: Other Clinician: Nicoletta Ba Referring Gervase Colberg: Treating Mahoganie Basher/Extender: Adele Kevin in Treatment: 258 Compression Therapy Performed for Wound Assessment: NonWound Condition Lymphedema - Right Leg Performed By: Clinician Shawn Stall, RN Compression Type: Four Layer Electronic Signature(s) Signed: 01/04/2021 5:49:32 PM By: Shawn Stall Entered By: Shawn Stall on 01/03/2021 11:19:44 -------------------------------------------------------------------------------- Encounter Discharge Information Details Patient Name: Date of Service: [name], [name] 01/03/2021 10:30 A M Medical Record Number: [id number] Patient Account Number: [id number] Date of Birth/Sex: Treating RN: [birth year] (70 y.o. [name] Primary Care Tylee Yum: Nicoletta Ba Other Clinician: Referring Stayce Delancy: Treating Yoav Okane/Extender: Adele Kevin in Treatment: [phone] Encounter Discharge Information Items Discharge Condition: Stable Ambulatory Status: Walker Discharge Destination: Home Transportation: Private Auto Accompanied By: self Schedule Follow-up Appointment: Yes Clinical Summary of Care: Electronic Signature(s) Signed: 01/04/2021 5:49:32 PM By: Shawn Stall Entered By: Shawn Stall on 01/03/2021 11:21:32 -------------------------------------------------------------------------------- Patient/Caregiver Education Details Patient  Name: Date of Service: Kevin [name], [name] 3/2/2022andnbsp10:30 A M Medical Record Number: [id number] Patient Account Number: [id number] Date of Birth/Gender: Treating RN: [birth year] (70 y.o. [name] Primary Care Physician: Nicoletta Ba Other Clinician: Referring Physician: Treating Physician/Extender: Adele Kevin in Treatment: [phone] Education Assessment Education Provided To: Patient Education Topics Provided Venous: Handouts: Managing Venous Disease and Related Ulcers Methods: Explain/Verbal Responses: Reinforcements needed Electronic Signature(s) Signed: 01/04/2021 5:49:32 PM By: Shawn Stall Entered By: Shawn Stall on 01/03/2021 11:21:09 -------------------------------------------------------------------------------- Wound Assessment Details Patient Name: Date of Service: [name], Kevin 01/03/2021 10:30 A M Medical Record Number: [id number] Patient Account Number: [id number] Date of Birth/Sex: Treating  RN: [birth year] (70 y.o. [name] Primary Care Corrie Brannen: Nicoletta Ba Other Clinician: Referring Alesha Jaffee: Treating Asahel Risden/Extender: Adele Kevin in Treatment: 258 Wound Status Wound Number: 191 Primary Etiology: Lymphedema Wound Location: Left, Lateral, Dorsal Foot Secondary Etiology: Diabetic Wound/Ulcer of the Lower Extremity Wounding Event: Gradually Appeared Wound Status: Open Date Acquired: 12/20/2020 Weeks Of Treatment: 2 Clustered Wound: No Wound Measurements Length: (cm) 1 Width: (cm) 0.5 Depth: (cm) 0.1 Area: (cm) 0.393 Volume: (cm) 0.039 % Reduction in Area: 0% % Reduction in Volume: 0% Wound Description Classification: Full Thickness Without Exposed Support Structur es Treatment Notes Wound #191 (Foot) Wound Laterality: Dorsal, Left, Lateral Cleanser Peri-Wound Care Zinc Oxide Ointment 30g tube Discharge Instruction: Apply Zinc Oxide to periwound with each dressing  change Sween Lotion (Moisturizing lotion) Discharge Instruction: Apply moisturizing lotion to leg Topical Primary Dressing KerraCel Ag Gelling Fiber Dressing, 2x2 in (silver alginate) Discharge Instruction: Apply silver alginate to wound bed as instructed Secondary Dressing Woven Gauze Sponge, Non-Sterile 4x4 in Discharge Instruction: Apply over primary dressing as directed. ABD Pad, 5x9 Discharge Instruction: Apply over primary dressing as directed. Secured With Compression Wrap FourPress (4 layer compression wrap) Discharge Instruction: Apply four layer compression as directed. Compression Stockings Add-Ons Notes 4 layer compression wrap to right leg. Electronic Signature(s) Signed: 01/03/2021 1:43:55 PM By: Zenaida Deed RN, BSN Signed: 01/04/2021 5:30:41 PM By: Karl Ito Entered By: Karl Ito on 01/03/2021 10:34:24 -------------------------------------------------------------------------------- Vitals Details Patient Name: Date of Service: Kevin [name], Joelle J. 01/03/2021 10:30 A M Medical Record Number: [id number] Patient Account Number: [id number] Date of Birth/Sex: Treating RN: July 28, 1951 (70 y.o. [name] Primary Care Johntavious Francom: Nicoletta Ba Other Clinician: Referring Kirke Breach: Treating Tasnim Balentine/Extender: Adele Kevin in Treatment: 258 Vital Signs Time Taken: 10:33 Temperature (F): 98.0 Height (in): 70 Pulse (bpm): 82 Weight (lbs): 380.2 Respiratory Rate (breaths/min): 20 Body Mass Index (BMI): 54.5 Blood Pressure (mmHg): 158/78 Capillary Blood Glucose (mg/dl): 155 Reference Range: 80 - 120 mg / dl Electronic Signature(s) Signed: 01/04/2021 5:30:41 PM By: Karl Ito Entered By: Karl Ito on 01/03/2021 10:34:10

## 2021-01-04 NOTE — Progress Notes (Signed)
JD, MCCASTER ([id number]) Visit Report for 01/03/2021 SuperBill Details Patient Name: Date of Service: [name], [name] 01/03/2021 Medical Record Number: [id number] Patient Account Number: [id number] Date of Birth/Sex: Treating RN: 1951/04/23 (70 y.o. 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 Electronic Signature(s) Signed: 01/04/2021 8:24:08 AM By: Lenda Kelp PA-C Signed: 01/04/2021 5:49:32 PM By: Shawn Stall Entered By: Shawn Stall on 01/03/2021 11:21:43

## 2021-01-10 ENCOUNTER — Ambulatory Visit: Payer: Medicare Other | Admitting: Family Medicine

## 2021-01-10 ENCOUNTER — Encounter (HOSPITAL_BASED_OUTPATIENT_CLINIC_OR_DEPARTMENT_OTHER): Payer: Medicare Other | Admitting: Physician Assistant

## 2021-01-10 ENCOUNTER — Other Ambulatory Visit: Payer: Self-pay

## 2021-01-10 DIAGNOSIS — I890 Lymphedema, not elsewhere classified: Secondary | ICD-10-CM | POA: Diagnosis not present

## 2021-01-10 DIAGNOSIS — L97511 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 of skin: Secondary | ICD-10-CM | POA: Diagnosis not present

## 2021-01-10 DIAGNOSIS — I110 Hypertensive heart disease with heart failure: Secondary | ICD-10-CM | POA: Diagnosis not present

## 2021-01-10 DIAGNOSIS — Z794 Long term (current) use of insulin: Secondary | ICD-10-CM | POA: Diagnosis not present

## 2021-01-10 DIAGNOSIS — E1140 Type 2 diabetes mellitus with diabetic neuropathy, unspecified: Secondary | ICD-10-CM | POA: Diagnosis not present

## 2021-01-10 DIAGNOSIS — I87333 Chronic venous hypertension (idiopathic) with ulcer and inflammation of bilateral lower extremity: Secondary | ICD-10-CM | POA: Diagnosis not present

## 2021-01-10 DIAGNOSIS — I509 Heart failure, unspecified: Secondary | ICD-10-CM | POA: Diagnosis not present

## 2021-01-10 DIAGNOSIS — I482 Chronic atrial fibrillation, unspecified: Secondary | ICD-10-CM | POA: Diagnosis not present

## 2021-01-10 DIAGNOSIS — E11621 Type 2 diabetes mellitus with foot ulcer: Secondary | ICD-10-CM | POA: Diagnosis not present

## 2021-01-10 DIAGNOSIS — E1151 Type 2 diabetes mellitus with diabetic peripheral angiopathy without gangrene: Secondary | ICD-10-CM | POA: Diagnosis not present

## 2021-01-10 DIAGNOSIS — L97821 Non-pressure chronic ulcer of other part of left lower leg limited to breakdown of skin: Secondary | ICD-10-CM | POA: Diagnosis not present

## 2021-01-10 DIAGNOSIS — I272 Pulmonary hypertension, unspecified: Secondary | ICD-10-CM | POA: Diagnosis not present

## 2021-01-10 DIAGNOSIS — L97211 Non-pressure chronic ulcer of right calf limited to breakdown of skin: Secondary | ICD-10-CM | POA: Diagnosis not present

## 2021-01-10 DIAGNOSIS — L97221 Non-pressure chronic ulcer of left calf limited to breakdown of skin: Secondary | ICD-10-CM | POA: Diagnosis not present

## 2021-01-10 DIAGNOSIS — L97521 Non-pressure chronic ulcer of other 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 of skin: Secondary | ICD-10-CM | POA: Diagnosis not present

## 2021-01-10 DIAGNOSIS — E11622 Type 2 diabetes mellitus with other skin ulcer: Secondary | ICD-10-CM | POA: Diagnosis not present

## 2021-01-10 NOTE — Progress Notes (Signed)
[name], [name] ([id number]) Visit Report for 01/10/2021 SuperBill Details Patient Name: Date of Service: [name], [name] 01/10/2021 Medical Record Number: [id number] Patient Account Number: [id number] Date of Birth/Sex: Treating RN: Aug 25, 1951 (70 y.o. 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 Electronic Signature(s) Signed: 01/10/2021 5:01:55 PM By: Shawn Stall Signed: 01/10/2021 6:04:37 PM By: Lenda Kelp PA-C Entered By: Shawn Stall on 01/10/2021 11:31:41

## 2021-01-10 NOTE — Progress Notes (Signed)
MATHIS, CASHMAN ([id number]) Visit Report for 01/10/2021 Arrival Information Details Patient Name: Date of Service: [name], [name] 01/10/2021 10:30 A M Medical Record Number: [id number] Patient Account Number: [id number] Date of Birth/Sex: Treating RN: 1951-01-05 (70 y.o. [name] Primary Care Britney Newstrom: Nicoletta Ba Other Clinician: Referring Arriah Wadle: Treating Rakim Moone/Extender: Adele Dan in Treatment: 259 Visit Information History Since Last Visit Added or deleted any medications: No Patient Arrived: [name] Any new allergies or adverse reactions: No Arrival Time: 10:20 Had a fall or experienced change in No Accompanied By: self activities of daily living that may affect Transfer Assistance: None risk of falls: Patient Identification Verified: Yes Signs or symptoms of abuse/neglect since No Secondary Verification Process Completed: Yes last visito Patient Requires Transmission-Based Precautions: No Hospitalized since last visit: No Patient Has Alerts: Yes Implantable device outside of the clinic No excluding cellular tissue based products placed in the center since last visit: Has Dressing in Place as Prescribed: Yes Has Compression in Place as Prescribed: Yes Has Footwear/Offloading in Place as Yes Prescribed: Left: Surgical Shoe with Pressure Relief Insole Right: Surgical Shoe with Pressure Relief Insole Pain Present Now: No Electronic Signature(s) Signed: 01/10/2021 5:01:55 PM By: Shawn Stall Entered By: Shawn Stall on 01/10/2021 11:26:28 -------------------------------------------------------------------------------- Compression Therapy Details Patient Name: Date of Service: [name], [name] 01/10/2021 10:30 A M Medical Record Number: [id number] Patient Account Number: [id number] Date of Birth/Sex: Treating RN: 1951-05-02 (70 y.o. [name] Primary Care Aniyia Rane: Nicoletta Ba Other Clinician: Referring  Xavian Hardcastle: Treating Marisha Renier/Extender: Adele Dan in Treatment: 259 Compression Therapy Performed for Wound Assessment: Wound #191 Left,Lateral,Dorsal Foot Performed By: Clinician Shawn Stall, RN Compression Type: Four Layer Electronic Signature(s) Signed: 01/10/2021 5:01:55 PM By: Shawn Stall Entered By: Shawn Stall on 01/10/2021 11:29:20 -------------------------------------------------------------------------------- Compression Therapy Details Patient Name: Date of Service: [name], MABILE. 01/10/2021 10:30 A M Medical Record Number: [id number] Patient Account Number: [id number] Date of Birth/Sex: Treating RN: [birth year] (70 y.o. [name] Primary Care Kean Gautreau: Nicoletta Ba Other Clinician: Referring Hezakiah Champeau: Treating Yaneliz Radebaugh/Extender: Adele Dan in Treatment: 259 Compression Therapy Performed for Wound Assessment: NonWound Condition Lymphedema - Right Leg Performed By: Clinician Shawn Stall, RN Compression Type: Four Layer Electronic Signature(s) Signed: 01/10/2021 5:01:55 PM By: Shawn Stall Entered By: Shawn Stall on 01/10/2021 11:29:40 -------------------------------------------------------------------------------- Encounter Discharge Information Details Patient Name: Date of Service: [name], [name] 01/10/2021 10:30 A M Medical Record Number: [id number] Patient Account Number: [id number] Date of Birth/Sex: Treating RN: 1950-11-30 (70 y.o. [name] Primary Care Nkosi Cortright: Nicoletta Ba Other Clinician: Referring Donnie Gedeon: Treating Josuel Koeppen/Extender: Adele Dan in Treatment: [phone] Encounter Discharge Information Items Discharge Condition: Stable Ambulatory Status: Walker Discharge Destination: Home Transportation: Private Auto Accompanied By: self Schedule Follow-up Appointment: Yes Clinical Summary of Care: Electronic Signature(s) Signed: 01/10/2021 5:01:55  PM By: Shawn Stall Entered By: Shawn Stall on 01/10/2021 11:31:31 -------------------------------------------------------------------------------- Patient/Caregiver Education Details Patient Name: Date of Service: [name] 3/9/2022andnbsp10:30 A M Medical Record Number: [id number] Patient Account Number: [id number] Date of Birth/Gender: Treating RN: 03-19-1951 (70 y.o. [name] Primary Care Physician: Nicoletta Ba Other Clinician: Referring Physician: Treating Physician/Extender: Adele Dan in Treatment: 259 Education Assessment Education Provided To: Patient Education Topics Provided Venous: Handouts: Managing Venous Disease and Related Ulcers Methods: Explain/Verbal Responses: Reinforcements needed Electronic Signature(s) Signed: 01/10/2021 5:01:55 PM By: Shawn Stall Entered By: Shawn Stall on 01/10/2021 11:31:15 --------------------------------------------------------------------------------  Wound Assessment Details Patient Name: Date of Service: [name], [name] 01/10/2021 10:30 A M Medical Record Number: [id number] Patient Account Number: [id number] Date of Birth/Sex: Treating RN: 1951/08/30 (70 y.o. [name] Primary Care Sincere Berlanga: Nicoletta Ba Other Clinician: Referring Jashayla Glatfelter: Treating Levert Heslop/Extender: Adele Dan in Treatment: 259 Wound Status Wound Number: 191 Primary Etiology: Lymphedema Wound Location: Left, Lateral, Dorsal Foot Secondary Etiology: Diabetic Wound/Ulcer of the Lower Extremity Wounding Event: Gradually Appeared Wound Status: Open Date Acquired: 12/20/2020 Weeks Of Treatment: 3 Clustered Wound: No Wound Measurements Length: (cm) 1 Width: (cm) 0.5 Depth: (cm) 0.1 Area: (cm) 0.393 Volume: (cm) 0.039 % Reduction in Area: 0% % Reduction in Volume: 0% Wound Description Classification: Full Thickness Without Exposed Support Structur es Treatment  Notes Wound #191 (Foot) Wound Laterality: Dorsal, Left, Lateral Cleanser Peri-Wound Care Zinc Oxide Ointment 30g tube Discharge Instruction: Apply Zinc Oxide to periwound with each dressing change Sween Lotion (Moisturizing lotion) Discharge Instruction: Apply moisturizing lotion to leg Topical Primary Dressing KerraCel Ag Gelling Fiber Dressing, 2x2 in (silver alginate) Discharge Instruction: Apply silver alginate to wound bed as instructed Secondary Dressing Woven Gauze Sponge, Non-Sterile 4x4 in Discharge Instruction: Apply over primary dressing as directed. ABD Pad, 5x9 Discharge Instruction: Apply over primary dressing as directed. Secured With Compression Wrap FourPress (4 layer compression wrap) Discharge Instruction: Apply four layer compression as directed. Compression Stockings Add-Ons Notes 4 layer compression wrap to right leg. Electronic Signature(s) Signed: 01/10/2021 5:01:55 PM By: Shawn Stall Entered By: Shawn Stall on 01/10/2021 11:28:49 -------------------------------------------------------------------------------- Vitals Details Patient Name: Date of Service: [name], [name] 01/10/2021 10:30 A M Medical Record Number: [id number] Patient Account Number: [id number] Date of Birth/Sex: Treating RN: 07-12-1951 (70 y.o. [name] Primary Care Athanasius Kesling: Nicoletta Ba Other Clinician: Referring Arizbeth Cawthorn: Treating Hannahgrace Lalli/Extender: Adele Dan in Treatment: 259 Vital Signs Time Taken: 10:20 Temperature (F): 97.8 Height (in): 70 Pulse (bpm): 66 Weight (lbs): 380.2 Respiratory Rate (breaths/min): 20 Body Mass Index (BMI): 54.5 Blood Pressure (mmHg): 159/68 Capillary Blood Glucose (mg/dl): 553 Reference Range: 80 - 120 mg / dl Electronic Signature(s) Signed: 01/10/2021 5:01:55 PM By: Shawn Stall Entered By: Shawn Stall on 01/10/2021 74:82:70

## 2021-01-17 ENCOUNTER — Other Ambulatory Visit: Payer: Self-pay

## 2021-01-17 ENCOUNTER — Encounter (HOSPITAL_BASED_OUTPATIENT_CLINIC_OR_DEPARTMENT_OTHER): Payer: Medicare Other | Admitting: Physician Assistant

## 2021-01-17 DIAGNOSIS — I509 Heart failure, unspecified: Secondary | ICD-10-CM | POA: Diagnosis not present

## 2021-01-17 DIAGNOSIS — I110 Hypertensive heart disease with heart failure: Secondary | ICD-10-CM | POA: Diagnosis not present

## 2021-01-17 DIAGNOSIS — I272 Pulmonary hypertension, unspecified: Secondary | ICD-10-CM | POA: Diagnosis not present

## 2021-01-17 DIAGNOSIS — L97821 Non-pressure chronic ulcer of other part of left lower leg limited to breakdown of skin: Secondary | ICD-10-CM | POA: Diagnosis not present

## 2021-01-17 DIAGNOSIS — L97211 Non-pressure chronic ulcer of right calf limited to breakdown of skin: Secondary | ICD-10-CM | POA: Diagnosis not present

## 2021-01-17 DIAGNOSIS — I890 Lymphedema, not elsewhere classified: Secondary | ICD-10-CM | POA: Diagnosis not present

## 2021-01-17 DIAGNOSIS — Z794 Long term (current) use of insulin: Secondary | ICD-10-CM | POA: Diagnosis not present

## 2021-01-17 DIAGNOSIS — L97511 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 of skin: Secondary | ICD-10-CM | POA: Diagnosis not present

## 2021-01-17 DIAGNOSIS — E1151 Type 2 diabetes mellitus with diabetic peripheral angiopathy without gangrene: Secondary | ICD-10-CM | POA: Diagnosis not present

## 2021-01-17 DIAGNOSIS — E1140 Type 2 diabetes mellitus with diabetic neuropathy, unspecified: Secondary | ICD-10-CM | POA: Diagnosis not present

## 2021-01-17 DIAGNOSIS — E11621 Type 2 diabetes mellitus with foot ulcer: Secondary | ICD-10-CM | POA: Diagnosis not present

## 2021-01-17 DIAGNOSIS — L97522 Non-pressure chronic ulcer of other part of left foot with fat layer exposed: Secondary | ICD-10-CM | POA: Diagnosis not present

## 2021-01-17 DIAGNOSIS — L97521 Non-pressure chronic ulcer of other 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 of skin: Secondary | ICD-10-CM | POA: Diagnosis not present

## 2021-01-17 DIAGNOSIS — E11622 Type 2 diabetes mellitus with other skin ulcer: Secondary | ICD-10-CM | POA: Diagnosis not present

## 2021-01-17 DIAGNOSIS — I482 Chronic atrial fibrillation, unspecified: Secondary | ICD-10-CM | POA: Diagnosis not present

## 2021-01-17 DIAGNOSIS — L97221 Non-pressure chronic ulcer of left calf limited to breakdown of skin: Secondary | ICD-10-CM | POA: Diagnosis not present

## 2021-01-17 DIAGNOSIS — I87333 Chronic venous hypertension (idiopathic) with ulcer and inflammation of bilateral lower extremity: Secondary | ICD-10-CM | POA: Diagnosis not present

## 2021-01-17 NOTE — Progress Notes (Addendum)
[name], [name] ([id number]) Visit Report for 01/17/2021 Chief Complaint Document Details Patient Name: Date of Service: [name], [name] 01/17/2021 10:30 A M Medical Record Number: [id number] Patient Account Number: [id number] Date of Birth/Sex: Treating RN: 1951-02-06 (70 y.o. [name] Primary Care Provider: Nicoletta Ba Other Clinician: Referring Provider: Treating Provider/Extender: Adele Dan in Treatment: [phone] Information Obtained from: Patient Chief Complaint patient is here for evaluation venous/lymphedema weeping Electronic Signature(s) Signed: 01/17/2021 10:30:11 AM By: Lenda Kelp PA-C Entered By: Lenda Kelp on 01/17/2021 10:30:10 -------------------------------------------------------------------------------- HPI Details Patient Name: Date of Service: [name], [name] 01/17/2021 10:30 A M Medical Record Number: [id number] Patient Account Number: [id number] Date of Birth/Sex: Treating RN: 02/18/1951 (70 y.o. [name] Primary Care Provider: Nicoletta Ba Other Clinician: Referring Provider: Treating Provider/Extender: Adele Dan in Treatment: 260 History of Present Illness HPI Description: Referred by PCP for consultation. Patient has long standing history of BLE venous stasis, no prior ulcerations. At beginning of month, developed cellulitis and weeping. Received IM Rocephin followed by Keflex and resolved. Wears compression stocking, appr 6 months old. Not sure strength. No present drainage. 01/22/16 this is a patient who is a type II diabetic on insulin. 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 He tells me he religiously wears pressure stockings of uncertain strength. He was here with weeping edema about 8 months ago but did not have an open wound. Roughly a month ago he had a reopening on his bilateral legs. He is been using bandages and Neosporin. He  does not complain of pain. 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 Last BUN/creatinine I have is from 11/20/15 at 13 and 1.0 respectively 01/29/16; patient arrives today having tolerated the Profore wrap. He brought in his stockings and these are 18 mmHg stockings he bought from [employer]. The compression here is likely inadequate. He does not complain of pain or excessive drainage she has no systemic symptoms. The wound on the right looks improved as does the one on the left although one on the left is more substantial with still tissue at risk below the actual wound area on the bilateral posterior calf 02/05/16; patient arrives with poor edema control. He states that we did put a 4 layer compression on it last week. No weight appear 5 this. 02/12/16; the area on the posterior right Has healed. The left Has a substantial wound that has necrotic surface eschar that requires a debridement with a curette. 02/16/16;the patient called or a Nurse visit secondary to increased swelling. He had been in earlier in the week with his right leg healed. He was transitioned to is on pressure stocking on the right leg with the only open wound on the left, a substantial area on the left posterior calf. Note he has a history of severe lower extremity edema, he has a history of chronic atrial fibrillation but not heart failure per my notes but I'll need to research this. He is not complaining of chest pain shortness of breath or orthopnea. The intake nurse noted blisters on the previously closed right leg 02/19/16; this is the patient's regular visit day. I see him on Friday with escalating edema new wounds on the right leg and clear signs of at least right ventricular heart failure. I increased his Lasix to 40 twice a day.  He is returning currently in follow-up. States he is noticed a decrease in that the  edema 02/26/16 patient's legs have much less edema. There is nothing really open on the right leg. The left leg has improved condition of the large superficial wound on the posterior left leg 03/04/16; edema control is very much better. The patient's right leg wounds have healed. On the left leg he continues to have severe venous inflammation on the posterior aspect of the left leg. There is no tenderness and I don't think any of this is cellulitis. 03/11/16; patient's right leg is married healed and he is in his own stocking. The patient's left leg has deteriorated somewhat. There is a lot of erythema around the wound on the posterior left leg. There is also a significant rim of erythema posteriorly just above where the wrap would've ended there is a new wound in this location and a lot of tenderness. Can't rule out cellulitis in this area. 03/15/16; patient's right leg remains healed and he is in his own stocking. The patient's left leg is much better than last review. His major wound on the posterior aspect of his left Is almost fully epithelialized. He has 3 small injuries from the wraps. Really. Erythema seems a lot better on antibiotics 03/18/16; right leg remains healed and he is in his own stocking. The patient's left leg is much better. The area on the posterior aspect of the left calf is fully epithelialized. His 3 small injuries which were wrap injuries on the left are improved only one seems still open his erythema has resolved 03/25/16; patient's right leg remains healed and he is in his own stocking. There is no open area today on the left leg posterior leg is completely closed up. His wrap injuries at the superior aspect of his leg are also resolved. He looks as though he has some irritation on the dorsal ankle but this is fully epithelialized without evidence of infection. 03/28/16; we discharged this patient on Monday. Transitioned him into his own stocking. There were problems almost  immediately with uncontrolled swelling weeping edema multiple some of which have opened. He does not feel systemically unwell in particular no chest pain no shortness of breath and he does not feel 04/08/16; the edema is under better control with the Profore light wrap but he still has pitting edema. There is one large wound anteriorly 2 on the medial aspect of his left leg and 3 small areas on the superior posterior calf. Drainage is not excessive he is tolerating a Profore light well 04/15/16; put a Profore wrap on him last week. This is controlled is edema however he had a lot of pain on his left anterior foot most of his wounds are healed 04/22/16 once again the patient has denuded areas on the left anterior foot which he states are because his wrap slips up word. He saw his primary physician today is on Lasix 40 twice a day and states that he his weight is down 20 pounds over the last 3 months. 04/29/16: Much improved. left anterior foot much improved. He is now on Lasix 80 mg per day. Much improved edema control 05/06/16; I was hoping to be able to discharge him today however once again he has blisters at a low level of where the compression was placed last week mostly on his left lateral but also his left medial leg and a small area on the anterior part of the left foot. 05/09/16; apparently the patient went  home after his appointment on 7/4 later in the evening developing pain in his upper medial thigh together with subjective fever and chills although his temperature was not taken. The pain was so intense he felt he would probably have to call 911. However he then remembered that he had leftover doxycycline from a previous round of antibiotics and took these. By the next morning he felt a lot better. He called and spoke to one of our nurses and I approved doxycycline over the phone thinking that this was in relation to the wounds we had previously seen although they were definitely were not. The  patient feels a lot better old fever no chills he is still working. Blood sugars are reasonably controlled 05/13/16; patient is back in for review of his cellulitis on his anterior medial upper thigh. He is taking doxycycline this is a lot better. Culture I did of the nodular area on the dorsal aspect of his foot grew MRSA this also looks a lot better. 05/20/16; the patient is cellulitis on the medial upper thigh has resolved. All of his wound areas including the left anterior foot, areas on the medial aspect of the left calf and the lateral aspect of the calf at all resolved. He has a new blister on the left dorsal foot at the level of the fourth toe this was excised. No evidence of infection 05/27/16; patient continues to complain weeping edema. He has new blisterlike wounds on the left anterior lateral and posterior lateral calf at the top of his wrap levels. The area on his left anterior foot appears better. He is not complaining of fever, pain or pruritus in his feet. 05/30/16; the patient's blisters on his left anterior leg posterior calf all look improved. He did not increase the Lasix 100 mg as I suggested because he was going to run out of his 40 mg tablets. He is still having weeping edema of his toes 06/03/16; I renewed his Lasix at 80 mg once a day as he was about to run out when I last saw him. He is on 80 mg of Lasix now. I have asked him to cut down on the excessive amount of water he was drinking and asked him to drink according to his thirst mechanisms 06/12/2016 -- was seen 2 days ago and was supposed to wear his compression stockings at home but he is developed lymphedema and superficial blisters on the left lower extremity and hence came in for a review 06/24/16; the remaining wound is on his left anterior leg. He still has edema coming from between his toes. There is lymphedema here however his edema is generally better than when I last saw this. He has a history of atrial fibrillation  but does not have a known history of congestive heart failure nevertheless I think he probably has this at least on a diastolic basis. 07/01/16 I reviewed his echocardiogram from January 2017. This was essentially normal. He did not have LVH, EF of 55-60%. His right ventricular function was normal although he did have trivial tricuspid and pulmonic regurgitation. This is not audible on exam however. I increased his Lasix to do massive edema in his legs well above his knees I think in early July. He was also drinking an excessive amount of water at the time. 07/15/16; missed his appointment last week because of the Labor Day holiday on Monday. He could not get another appointment later in the week. Started to feel the wrap digging in superiorly so  we remove the top half and the bottom half of his wrap. He has extensive erythema and blistering superiorly in the left leg. Very tender. Very swollen. Edema in his foot with leaking edema fluid. He has not been systemically unwell 07/22/16; the area on the left leg laterally required some debridement. The medial wounds look more stable. His wrap injury wounds appear to have healed. Edema and his foot is better, weeping edema is also better. He tells me he is meeting with the supplier of the external compression pumps at work 08/05/16; the patient was on vacation last week in [facility]. His wrap is been on for an extended period of time. Also over the weekend he developed an extensive area of tender erythema across his anterior medial thigh. He took to doxycycline yesterday that he had leftover from a previous prescription. The patient complains of weeping edema coming out of his toes 08/08/16; I saw this patient on 10/2. He was tender across his anterior thigh. I put him on doxycycline. He returns today in follow-up. He does not have any open wounds on his lower leg, he still has edema weeping into his toes. 08/12/16; patient was seen back urgently today to  follow-up for his extensive left thigh cellulitis/erysipelas. He comes back with a lot less swelling and erythema pain is much better. I believe I gave him Augmentin and Cipro. His wrap was cut down as he stated a roll down his legs. He developed blistering above the level of the wrap that remained. He has 2 open blisters and 1 intact. 08/19/16; patient is been doing his primary doctor who is increased his Lasix from 40-80 once a day or 80 already has less edema. Cellulitis has remained improved in the left thigh. 2 open areas on the posterior left calf 08/26/16; he returns today having new open blisters on the anterior part of his left leg. He has his compression pumps but is not yet been shown how to use some vital representative from the supplier. 09/02/16 patient returns today with no open wounds on the left leg. Some maceration in his plantar toes 09/10/2016 -- Dr. Leanord Hawking had recently discharged him on 09/02/2016 and he has come right back with redness swelling and some open ulcers on his left lower extremity. He says this was caused by trying to apply his compression stockings and he's been unable to use this and has not been able to use his lymphedema pumps. He had some doxycycline leftover and he has started on this a few days ago. 09/16/16; there are no open wounds on his leg on the left and no evidence of cellulitis. 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 He does not complain of symptoms here. I am not clear use using his external compression pumps. 09/23/16; I have not seen this patient in 2 weeks. He canceled his appointment 10 days ago as he was going on vacation. He tells me that on Monday he noticed a large area on his posterior left leg which is been draining copiously and is reopened into a large wound. He is been using ABDs and the external part of his juxtalite, according to our nurse this was not on properly. 10/07/16;  Still a substantial area on the posterior left leg. Using silver alginate 10/14/16; in general better although there is still open area which looks healthy. Still using silver alginate. He reminds me that this happen before he left for [facility].  T oday while he was showering in the morning. He had been using his juxtalite's 10/21/16; the area on his posterior left leg is fully epithelialized. However he arrives today with a large area of tender erythema in his medial and posterior left thigh just above the knee. I have marked the area. Once again he is reluctant to consider hospitalization. I treated him with oral antibiotics in the past for a similar situation with resolution I think with doxycycline however this area it seems more extensive to me. He is not complaining of fever but does have chills and says states he is thirsty. His blood sugar today was in the 140s at home 10/25/16 the area on his posterior left leg is fully epithelialized although there is still some weeping edema. The large area of tenderness and erythema in his medial and posterior left thigh is a lot less tender although there is still a lot of swelling in this thigh. He states he feels a lot better. He is on doxycycline and Augmentin that I started last week. This will continued until Tuesday, December 26. I have ordered a duplex ultrasound of the left thigh rule out DVT whether there is an abscess something that would need to be drained I would also like to know. 11/01/16; he still has weeping edema from a not fully epithelialized area on his left posterior calf. Most of the rest of this looks a lot better. He has completed his antibiotics. His thigh is a lot better. Duplex ultrasound did not show a DVT in the thigh 11/08/16; he comes in today with more Denuded surface epithelium from the posterior aspect of his calf. There is no real evidence of cellulitis. The superior aspect of his wrap appears to have put quite an  indentation in his leg just below the knee and this may have contributed. He does not complain of pain or fever. We have been using silver alginate as the primary dressing. The area of cellulitis in the right thigh has totally resolved. He has been using his compression stockings once a week 11/15/16; the patient arrives today with more loss of epithelium from the posterior aspect of his left calf. He now has a fairly substantial wound in this area. The reason behind this deterioration isn't exactly clear although his edema is not well controlled. He states he feels he is generally more swollen systemically. He is not complaining of chest pain shortness of breath fever. T me he has an appointment with his primary physician in early February. He is on 80 mg of oral ells Lasix a day. He claims compliance with the external compression pumps. He is not having any pain in his legs similar to what he has with his recurrent cellulitis 11/22/16; the patient arrives a follow-up of his large area on his left lateral calf. This looks somewhat better today. He came in earlier in the week for a dressing change since I saw him a week ago. He is not complaining of any pain no shortness of breath no chest pain 11/28/16; the patient arrives for follow-up of his large area on the left lateral calf this does not look better. In fact it is larger weeping edema. The surface of the wound does not look too bad. We have been using silver alginate although I'm not certain that this is a dressing issue. 12/05/16; again the patient follows up for a large wound on the left lateral and left posterior calf this does not look better. There continues  to be weeping edema necrotic surface tissue. More worrisome than this once again there is erythema below the wound involving the distal Achilles and heel suggestive of cellulitis. He is on his feet working most of the day of this is not going well. We are changing his dressing twice a week to  facilitate the drainage. 12/12/16; not much change in the overall dimensions of the large area on the left posterior calf. This is very inflamed looking. I gave him an. Doxycycline last week does not really seem to have helped. He found the wrap very painful indeed it seems to of dog into his legs superiorly and perhaps around the heel. He came in early today because the drainage had soaked through his dressings. 12/19/16- patient arrives for follow-up evaluation of his left lower extremity ulcers. He states that he is using his lymphedema pumps once daily when there is "no drainage". He admits to not using his lipedema pumps while under current treatment. His blood sugars have been consistently between 150-200. 12/26/16; the patient is not using his compression pumps at home because of the wetness on his feet. I've advised him that I think it's important for him to use this daily. He finds his feet too wet, he can put a plastic bag over his legs while he is in the pumps. Otherwise I think will be in a vicious circle. We are using silver alginate to the major area on his left posterior calf 01/02/17; the patient's posterior left leg has further of all into 3 open wounds. All of them covered with a necrotic surface. He claims to be using his compression pumps once a day. His edema control is marginal. Continue with silver alginate 01/10/17; the patient's left posterior leg actually looks somewhat better. There is less edema, less erythema. Still has 3 open areas covered with a necrotic surface requiring debridement. He claims to be using his compression pumps once a day his edema control is better 01/17/17; the patient's left posterior calf look better last week when I saw him and his wrap was changed 2 days ago. He has noted increasing pain in the left heel and arrives today with much larger wounds extensive erythema extending down into the entire heel area especially tender medially. He is not  systemically unwell CBGs have been controlled no fever. Our intake nurse showed me limegreen drainage on his AVD pads. 01/24/17; his usual this patient responds nicely to antibiotics last week giving him Levaquin for presumed Pseudomonas. The whole entire posterior part of his leg is much better much less inflamed and in the case of his Achilles heel area much less tender. He has also had some epithelialization posteriorly there are still open areas here and still draining but overall considerably better 01/31/17- He has continue to tolerate the compression wraps. he states that he continues to use the lymphedema pumps daily, and can increase to twice daily on the weekends. He is voicing no complaints or concerns regarding his LLE ulcers 02/07/17-he is here for follow-up evaluation. He states that he noted some erythema to the left medial and anterior thigh, which he states is new as of yesterday. He is concerned about recurrent cellulitis. He states his blood sugars have been slightly elevated, this morning in the 180s 02/14/17; he is here for follow-up evaluation. When he was last here there was erythema superiorly from his posterior wound in his anterior thigh. He was prescribed Levaquin however a culture of the wound surface grew MRSA over the  phone I changed him to doxycycline on Monday and things seem to be a lot better. 02/24/17; patient missed his appointment on Friday therefore we changed his nurse visit into a physician visit today. Still using silver alginate on the large area of the posterior left thigh. He isn't new area on the dorsal left second toe 03/03/17; actually better today although he admits he has not used his external compression pumps in the last 2 days or so because of work responsibilities over the weekend. 03/10/17; continued improvement. External compression pumps once a day almost all of his wounds have closed on the posterior left calf. Better edema control 03/17/17; in general  improved. He still has 3 small open areas on the lateral aspect of his left leg however most of the area on the posterior part of his leg is epithelialized. He has better edema control. He has an ABD pad under his stocking on the right anterior lower leg although he did not let us look at that today. 03/24/17; patient arrives back in clinic today with no open areas however there are areas on the posterior left calf and anterior left calf that are less than 100% epithelialized. His edema is well controlled in the left lower leg. There is some pitting edema probably lymphedema in the left upper thigh. He uses compression pumps at home once per day. I tried to get him to do this twice a day although he is very reticent. 04/01/2017 -- for the last 2 days he's had significant redness, tenderness and weeping and came in for an urgent visit today. 04/07/17; patient still has 6 more days of doxycycline. He was seen by Dr. Meyer Russel last Wednesday for cellulitis involving the posterior aspect, lateral aspect of his Involving his heel. For the most part he is better there is less erythema and less weeping. He has been on his feet for 12 hours 2 over the weekend. Using his compression pumps once a day 04/14/17 arrives today with continued improvement. Only one area on the posterior left calf that is not fully epithelialized. He has intense bilateral venous inflammation associated with his chronic venous insufficiency disease and secondary lymphedema. We have been using silver alginate to the left posterior calf wound In passing he tells [place] today that the right leg but we have not seen in quite some time has an open area on it but he doesn't want [place] to look at this today states he will show this to [place] next week. 04/21/17; there is no open area on his left leg although he still reports some weeping edema. He showed [place] his right leg today which is the first time we've seen this leg in a long time. He has a large area of  open wound on the right leg anteriorly healthy granulation. Quite a bit of swelling in the right leg and some degree of venous inflammation. He told us about the right leg in passing last week but states that deterioration in the right leg really only happened over the weekend 04/28/17; there is no open area on the left leg although there is an irritated part on the posterior which is like a wrap injury. The wound on the right leg which was new from last week at least to [place] is a lot better. 05/05/17; still no open area on the left leg. Patient is using his new compression stocking which seems to be doing a good job of controlling the edema. He states he is using his compression  pumps once per day. The right leg still has an open wound although it is better in terms of surface area. Required debridement. A lot of pain in the posterior right Achilles marked tenderness. Usually this type of presentation this patient gives concern for an active cellulitis 05/12/17; patient arrives today with his major wound from last week on the right lateral leg somewhat better. Still requiring debridement. He was using his compression stocking on the left leg however that is reopened with superficial wounds anteriorly he did not have an open wound on this leg previously. He is still using his juxta light's once daily at night. He cannot find the time to do this in the morning as he has to be at work by 7 AM 05/19/17; right lateral leg wound looks improved. No debridement required. The concerning area is on the left posterior leg which appears to almost have a subcutaneous hemorrhagic component to it. We've been using silver alginate to all the wounds 05/26/17; the right lateral leg wound continues to look improved. However the area on the left posterior calf is a tightly adherent surface. [place] using silver alginate. Because of the weeping edema in his legs there is very little good alternatives. 06/02/17; the patient left  here last week looking quite good. Major wound on the left posterior calf and a small one on the right lateral calf. Both of these look satisfactory. He tells me that by Wednesday he had noted increased pain in the left leg and drainage. He called on Thursday and Friday to get an appointment here but we were blocked. He did not go to urgent care or his primary physician. He thinks he had a fever on Thursday but did not actually take his temperature. He has not been using his compression pumps on the left leg because of pain. I advised him to go to the emergency room today for IV antibiotics for stents of left leg cellulitis but he has refused I have asked him to take 2 days off work to keep his leg elevated and he has refused this as well. In view of this I'm going to call him and Augmentin and doxycycline. 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 However after a detailed discussion regarding the risk benefits and alternatives he refuses to get admitted to the hospital. With no other recourse I will continue him on oral antibiotics as before and hopefully he'll have his infectious disease consultation this week. 06/16/2017 -- the patient was seen today by the nurse practitioner at infectious disease Ms. Dixon. 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 He has also been advise Lotrimin cream locally. she also had wise IV antibiotics for his condition if it flares up 06/23/17; patient arrives today with drainage bilaterally although the remaining wound on the left posterior calf after  cleaning up today "highlighter yellow drainage" did not look too bad. Unfortunately he has had breakdown on the right anterior leg [previously this leg had not been open and he is using a black stocking] he went to see infectious disease  and is been put on clindamycin 150 mg daily, I did not verify the dose although I'm not familiar with using clindamycin in this dosing range, perhaps for prophylaxisoo 06/27/17; I brought this patient back today to follow-up on the wound deterioration on the right lower leg together with surrounding cellulitis. I started him on doxycycline 4 days ago. This area looks better however he comes in today with intense cellulitis on the medial part of his left thigh. This is not have a wound in this area. Extremely tender. We've been using silver alginate to the wounds on the right lower leg left lower leg with bilateral 4 layer compression he is using his external compression pumps once a day 07/04/17; patient's left medial thigh cellulitis looks better. He has not been using his compression pumps as his insert said it was contraindicated with cellulitis. His right leg continues to make improvements all the wounds are still open. We only have one remaining wound on the left posterior calf. Using silver alginate to all open areas. He is on doxycycline which I started a week ago and should be finishing I gave him Augmentin after Thursday's visit for the severe cellulitis on the left medial thigh which fortunately looks better 07/14/17; the patient's left medial thigh cellulitis has resolved. The cellulitis in his right lower calf on the right also looks better. All of his wounds are stable to improved we've been using silver alginate he has completed the antibiotics I have given him. He has clindamycin 150 mg once a day prescribed by infectious disease for prophylaxis, I've advised him to start this now. We have been using bilateral Unna boots over silver alginate to the wound  areas 07/21/17; the patient is been to see infectious disease who noted his recurrent problems with cellulitis. He was not able to tolerate prophylactic clindamycin therefore he is on amoxicillin 500 twice a day. He also had a second daily dose of Lasix added By Dr. Oneta Rack but he is not taking this. Nor is he being completely compliant with his compression pumps a especially not this week. He has 2 remaining wounds one on the right posterior lateral lower leg and one on the left posterior medial lower leg. 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 The patient has Unna boots bilaterally. 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 He has bilateral Unna boots for compression and silver alginate to his wounds. Arrives today with his legs looking as good as I have seen him in quite some time. Not surprisingly his wounds look better as well with improvement on the right lateral leg venous insufficiency wound and also the left medial leg. He is still using the compression pumps once a day 08/11/17; both legs appear to be doing better wounds on the right lateral and left medial legs look better. Skin on the right leg quite good. He is been using silver alginate as the primary dressing. I'm going to use Anasept gel calcium alginate and maintain all the secondary dressings 08/18/17; the patient continues to actually do quite well. The area on his right lateral leg is just about closed the left medial also looks better although it is still moist in this area. His edema is well controlled we have been using Anasept gel with calcium alginate and  the usual secondary dressings, 4 layer compression and once daily use of his compression pumps "always been able to manage 09/01/17; the patient  continues to do reasonably well in spite of his trip to [place]. The area on the right lateral leg is epithelialized. Left is much better but still open. He has more edema and more chronic erythema on the left leg [venous inflammation] 09/08/17; he arrives today with no open wound on the right lateral leg and decently controlled edema. Unfortunately his left leg is not nearly as in his good situation as last week.he apparently had increasing edema starting on Saturday. He edema soaked through into his foot so used a plastic bag to walk around his home. The area on the medial right leg which was his open area is about the same however he has lost surface epithelium on the left lateral which is new and he has significant pain in the Achilles area of the left foot. He is already on amoxicillin chronically for prophylaxis of cellulitis in the left leg 09/15/17; he is completed a week of doxycycline and the cellulitis in the left posterior leg and Achilles area is as usual improved. He still has a lot of edema and fluid soaking through his dressings. There is no open wound on the right leg. He saw infectious disease NP today 09/22/17;As usual 1 we transition him from our compression wraps to his stockings things did not go well. He has several small open areas on the right leg. He states this was caused by the compression wrap on his skin although he did not wear this with the stockings over them. He has several superficial areas on the left leg medially laterally posteriorly. He does not have any evidence of active cellulitis especially involving the left Achilles The patient is traveling from [facility] Saturday going to [facility]. He states he isn't attempting to get an appointment with a heel objects wound center there to change his dressings. I am not completely certain whether this will work 10/06/17; the patient came in on Friday for a nurse visit and the nurse reported that his legs actually look  quite good. He arrives in clinic today for his regular follow-up visit. He has a new wound on his left third toe over the PIP probably caused by friction with his footwear. He has small areas on the left leg and a very superficial but epithelialized area on the right anterior lateral lower leg. Other than that his legs look as good as I've seen him in quite some time. We have been using silver alginate Review of systems; no chest pain no shortness of breath other than this a 10 point review of systems negative 10/20/17; seen by Dr. Meyer Russel last week. He had taken some antibiotics [doxycycline] that he had left over. Dr. Meyer Russel thought he had candida infection and declined to give him further antibiotics. He has a small wound remaining on the right lateral leg several areas on the left leg including a larger area on the left posterior several left medial and anterior and a small wound on the left lateral. The area on the left dorsal third toe looks a lot better. ROS; Gen.; no fever, respiratory no cough no sputum Cardiac no chest pain other than this 10 point review of system is negative 10/30/17; patient arrives today having fallen in the bathtub 3 days ago. It took him a while to get up. He has pain and maceration in the wounds on his left  leg which have deteriorated. He has not been using his pumps he also has some maceration on the right lateral leg. 11/03/17; patient continues to have weeping edema especially in the left leg. This saturates his dressings which were just put on on 12/27. As usual the doxycycline seems to take care of the cellulitis on his lower leg. He is not complaining of fever, chills, or other systemic symptoms. He states his leg feels a lot better on the doxycycline I gave him empirically. He also apparently gets injections at his primary doctor's officeo Rocephin for cellulitis prophylaxis. I didn't ask him about his compression pump compliance today I think that's probably  marginal. Arrives in the clinic with all of his dressings primary and secondary macerated full of fluid and he has bilateral edema 11/10/17; the patient's right leg looks some better although there is still a cluster of wounds on the right lateral. The left leg is inflamed with almost circumferential skin loss medially to laterally although we are still maintaining anteriorly. He does not have overt cellulitis there is a lot of drainage. He is not using compression pumps. We have been using silver alginate to the wound areas, there are not a lot of options here 11/17/17; the patient's right leg continues to be stable although there is still open wounds, better than last week. The inflammation in the left leg is better. Still loss of surface layer epithelium especially posteriorly. There is no overt cellulitis in the amount of edema and his left leg is really quite good, tells me he is using his compression pumps once a day. 11/24/17; patient's right leg has a small superficial wound laterally this continues to improve. The inflammation in the left leg is still improving however we have continuous surface layer epithelial loss posteriorly. There is no overt cellulitis in the amount of edema in both legs is really quite good. He states he is using his compression pumps on the left leg once a day for 5 out of 7 days 12/01/17; very small superficial areas on the right lateral leg continue to improve. Edema control in both legs is better today. He has continued loss of surface epithelialization and left posterior calf although I think this is better. We have been using silver alginate with large number of absorptive secondary dressings 4 layer on the left Unna boot on the right at his request. He tells me he is using his compression pumps once a day 12/08/17; he has no open area on the right leg is edema control is good here. On the left leg however he has marked erythema and tenderness breakdown of skin. He has  what appears to be a wrap injury just distal to the popliteal fossa. 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 There was swelling erythema and breakdown in the right leg. He has much worse skin breakdown in the left leg as well multiple open areas medially and posteriorly as well as laterally. He tells me he has been using his compression pumps but tells me he feels that the drainage out of his leg is worse when he uses a compression pumps. T be fair to him he is been saying this o for a while however I don't  know that I have really been listening to this. I wonder if the compression pumps are working properly 12/22/17;. Once again he arrives with severe erythema, weeping edema from the left greater than right leg. Noncompliance with compression pumps. New this visit he is complaining of pain on the lateral aspect of the right leg and the medial aspect of his right thigh. He apparently saw his cardiologist Dr. [name] who was ordered an echocardiogram area and I think this is a step in the right direction 12/25/17; started his doxycycline Monday night. There is still intense erythema of the right leg especially in the anterior thigh although there is less tenderness. The erythema around the wound on the right lateral calf also is less tender. He still complaining of pain in the left heel. His wounds are about the same right lateral left medial left lateral. Superficial but certainly not close to closure. He denies being systemically unwell no fever chills no abdominal pain no diarrhea 12/29/17; back in follow-up of his extensive right calf and right thigh cellulitis. I added amoxicillin to cover possible doxycycline resistant  strep. This seems to of done the trick he is in much less pain there is much less erythema and swelling. He has his echocardiogram at 11:00 this morning. X-ray of the left heel was also negative. 01/05/18; the patient arrived with his edema under much better control. Now that he is retired he is able to use his compression pumps daily and sometimes twice a day per the patient. He has a wound on the right leg the lateral wound looks better. Area on the left leg also looks a lot better. He has no evidence of cellulitis in his bilateral thighs I had a quick peak at his echocardiogram. He is in normal ejection fraction and normal left ventricular function. He has moderate pulmonary hypertension moderately reduced right ventricular function. One would have to wonder about chronic sleep apnea although he says he doesn't snore. He'll review the echocardiogram with his cardiologist. 01/12/18; the patient arrives with the edema in both legs under exemplary control. He is using his compression pumps daily and sometimes twice daily. His wound on the right lateral leg is just about closed. He still has some weeping areas on the posterior left calf and lateral left calf although everything is just about closed here as well. I have spoken with Aldean Baker who is the patient's nurse practitioner and infectious disease. She was concerned that the patient had not understood that the parenteral penicillin injections he was receiving for cellulitis prophylaxis was actually benefiting him. I don't think the patient actually saw that I would tend to agree we were certainly dealing with less infections although he had a serious one last month. 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 He is healed. He'll be placed in juxtalite compression wraps and increase his lymphedema pumps to twice daily. We will follow up again next week to ensure there are no issues with the new  regiment. 01/20/18-he is here for evaluation of bilateral lower extremity weeping edema. Yesterday he was placed in compression wrap to the right lower extremity and compression stocking to left lower shrubbery. He states he uses lymphedema pumps last night and again this morning and noted a blister to the left lower extremity. On exam he was noted to have drainage to the right lower extremity. He will be placed in Unna boots bilaterally and follow-up next week 01/26/18; patient was actually discharged a week  ago to his own juxta light stockings only to return the next day with bilateral lower extremity weeping edema.he was placed in bilateral Unna boots. He arrives today with pain in the back of his left leg. There is no open area on the right leg however there is a linear/wrap injury on the left leg and weeping edema on the left leg posteriorly. I spoke with infectious disease about 10 days ago. They were disappointed that the patient elected to discontinue prophylactic intramuscular penicillin shots as they felt it was particularly beneficial in reducing the frequency of his cellulitis. I discussed this with the patient today. He does not share this view. He'll definitely need antibiotics today. Finally [place] and returning a week later and he does not travel with his pumps. He is going by car 01/30/18; patient was seen 4 days ago and brought back in today for review of cellulitis in the left leg posteriorly. I put him on amoxicillin this really hasn't helped as much as I might like. He is also worried because [place]. Finally we will be rewrapping him. There is no open area on the right leg over his left leg has multiple weeping areas as usual 02/09/18; The same wrap on for 10 days. He did not pick up the last doxycycline I prescribed for him. He apparently took 4 days worth he already had. There is nothing open on  his right leg and the edema control is really quite good. He's had damage in the left leg medially and laterally especially probably related to the prolonged use of Unna boots 02/12/18; the patient arrived in clinic today for a nurse visit/wrap change. He complained of a lot of pain in the left posterior calf. He is taking doxycycline that I previously prescribed for him. Unfortunately even though he used his stockings and apparently used to compression pumps twice a day he has weeping edema coming out of the lateral part of his right leg. This is coming from the lower anterior lateral skin area. 02/16/18; the patient has finished his doxycycline and will finish the amoxicillin 2 days. The area of cellulitis in the left calf posteriorly has resolved. He is no longer having any pain. He tells me he is using his compression pumps at least once a day sometimes twice. 02/23/18; the patient finished his doxycycline and Amoxil last week. On Friday he noticed a small erythematous circle about the size of a quarter on the left lower leg just above his ankle. This rapidly expanded and he now has erythema on the lateral and posterior part of the thigh. This is bright red. Also has an area on the dorsal foot just above his toes and a tender area just below the left popliteal fossa. He came off his prophylactic penicillin injections at his own insistence one or 2 months ago. This is obviously deteriorated since then 03/02/18; patient is on doxycycline and Amoxil. Culture I did last week of the weeping area on the back of his left calf grew group B strep. I have therefore renewed the amoxicillin 500 3 times a day for a further week. He has not been systemically unwell. Still complaining of an area of discomfort right under his left popliteal fossa. There is no open wound on the right leg. He tells me that he is using his pumps twice a day on most days 03/09/18; patient arrives in clinic today completing his  amoxicillin  today. The cellulitis on his left leg is better. Furthermore he tells me that he had intramuscular penicillin shots that his primary care office today. However he also states that the wrap on his right leg fell down shortly after leaving clinic last week. He developed a large blister that was present when he came in for a nurse visit later in the week and then he developed intense discomfort around this area.He tells me he is using his compression pumps 03/16/18; the patient has completed his doxycycline. The infectious part of this/cellulitis in the left heel area left popliteal area is a lot better. He has 2 open areas on the right calf. Still areas on the left calf but this is a lot better as well. 03/24/18; the patient arrives complaining of pain in the left popliteal area again. He thinks some of this is wrap injury. He has no open area on the right leg and really no open area on the left calf either except for the popliteal area. He claims to be compliant with the compression pumps 03/31/18; I gave him doxycycline last week because of cellulitis in the left popliteal area. This is a lot better although the surface epithelium is denuded off and response to this. He arrives today with uncontrolled edema in the right calf area as well as a fingernail injury in the right lateral calf. There is only a few open areas on the left 04/06/18; I gave him amoxicillin doxycycline over the last 2 weeks that the amoxicillin should be completing currently. He is not complaining of any pain or systemic symptoms. The only open areas see has is on the right lateral lower leg paradoxically I cannot see anything on the left lower leg. He tells me he is using his compression pumps twice a day on most days. Silver alginate to the wounds that are open under 4 layer compression 04/13/18; he completed antibiotics and has no new complaints. Using his compression pumps. Silver alginate that anything that's opened 04/20/18; he is  using his compression pumps religiously. Silver alginate 4 layer compression anything that's opened. He comes in today with no open wounds on the left leg but 3 on the right including a new one posteriorly. He has 2 on the right lateral and one on the right posterior. He likes Unna boots on the right leg for reasons that aren't really clear we had the usual 4 layer compression on the left. It may be necessary to move to the 4 layer compression on the right however for now I left them in the Unna boots 04/27/18; he is using his compression pumps at least once a day. He has still the wounds on the right lateral calf. The area right posteriorly has closed. He does not have an open wound on the left under 4 layer compression however on the dorsal left foot just proximal to the toes and the left third toe 2 small open areas were identified 05/11/18; he has not uses compression pumps. The areas on the right lateral calf have coalesced into one large wound necrotic surface. On the left side he has one small wound anteriorly however the edema is now weeping out of a large part of his left leg. He says he wasn't using his pumps because of the weeping fluid. I explained to him that this is the time he needs to pump more 05/18/18; patient states he is using his compression pumps twice a day. The area on the right lateral large  wound albeit superficial. On the left side he has innumerable number of small new wounds on the left calf particularly laterally but several anteriorly and medially. All these appear to have healthy granulated base these look like the remnants of blisters however they occurred under compression. The patient arrives in clinic today with his legs somewhat better. There is certainly less edema, less multiple open areas on the left calf and the right anterior leg looks somewhat better as well superficial and a little smaller. However he relates pain and erythema over the last 3-4 days in the thigh  and I looked at this today. He has not been systemically unwell no fever no chills no change in blood sugar values 05/25/18; comes in today in a better state. The severe cellulitis on his left leg seems better with the Keflex. Not as tender. He has not been systemically unwell Hard to find an open wound on the left lower leg using his compression pumps twice a day The confluent wounds on his right lateral calf somewhat better looking. These will ultimately need debridement I didn't do this today. 06/01/18; the severe cellulitis on the left anterior thigh has resolved and he is completed his Keflex. There is no open wound on the left leg however there is a superficial excoriation at the base of the third toe dorsally. Skin on the bottom of his left foot is macerated looking. The left the wounds on the lateral right leg actually looks some better although he did require debridement of the top half of this wound area with an open curet 06/09/18 on evaluation today patient appears to be doing poorly in regard to his right lower extremity in particular this appears to likely be infected he has very thick purulent discharge along with a bright green tent to the discharge. This makes me concerned about the possibility of pseudomonas. He's also having increased discomfort at this point on evaluation. Fortunately there does not appear to be any evidence of infection spreading to the other location at this time. 06/16/18 on evaluation today patient appears to actually be doing fairly well. His ulcer has actually diminished in size quite significantly at this point which is good news. Nonetheless he still does have some evidence of infection he did see infectious disease this morning before coming here for his appointment. I did review the results of their evaluation and their note today. They did actually have him discontinue the Cipro and initiate treatment with linezolid at this time. He is doing this for the  next seven days and they recommended a follow-up in four months with them. He is the keep a log of the need for intermittent antibiotic therapy between now and when he falls back up with infectious disease. This will help them gaze what exactly they need to do to try and help them out. 06/23/18; the patient arrives today with no open wounds on the left leg and left third toe healed. He is been using his compression pumps twice a day. On the right lateral leg he still has a sizable wound but this is a lot better than last time I saw this. 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 Has been using silver alginate under 4 layer compression 06/30/18; the only open wound he has is on the right lateral leg and this looks healthy. No debridement is required. We have been using silver alginate.  He does not have an open wound on the left leg. There is apparently some drainage from the dorsal proximal third toe on the left although I see no open wound here. 07/03/18 on evaluation today patient was actually here just for a nurse visit rapid change. However when he was here on Wednesday for his rat change due to having been healed on the left and then developing blisters we initiated the wrap again knowing that he would be back today for [place] to reevaluate and see were at. Unfortunately he has developed some cellulitis into the proximal portion of his right lower extremity even into the region of his thigh. 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 He was placed on one as what at that point. Nonetheless he is done with that and has been tolerating it well otherwise. Doxycycline which in the past really did not seem to be effective for him. Nonetheless I think the best option may be for [place] to definitely reinitiate the antibiotics for a longer period of time. 07/07/18; since I  last saw this patient a week ago he has had a difficult time. At that point he did not have an open wound on his left leg. We transitioned him into juxta light stockings. He was apparently in the clinic the next day with blisters on the left lateral and left medial lower calf. He also had weeping edema fluid. He was put back into a compression wrap. He was also in the clinic on Friday with intense erythema in his right thigh. Per the patient he was started on Bactrim however that didn't work at all in terms of relieving his pain and swelling. He has taken 3 doxycycline that he had left over from last time and that seems to of helped. He has blistering on the right thigh as well. 07/14/18; the erythema on his right thigh has gotten better with doxycycline that he is finishing. 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 Presumably this cellulitis in the thigh was not related to that although I think this is a bit concerning going forward. He still has an area on the right lateral calf the blister on the right medial calf just below the knee that was discussed above. On the left 2 small open areas left medial and left lateral. Edema control is adequate. He is using his compression pumps twice a day 07/20/18; continued improvement in the condition of both legs especially the edema in his bilateral thighs. He tells me he is been losing weight through a combination of diet and exercise. He is using his compression pumps twice a day. So overall she made to the remaining wounds 07/27/2018; continued improvement in condition of both legs. His edema is well controlled. The area on the right lateral leg is just about closed he had one blisters show up on the medial left upper calf. We have him in 4 layer compression. He is going on a 10-day trip to [place]. He will be driving. He wants to wear Unna boots because of  the lessening amount of constriction. He will not use compression pumps while he is away 08/05/18 on evaluation today patient actually appears to be doing decently well all things considered in regard to his bilateral lower extremities. The worst ulcer is actually only posterior aspect of his left lower extremity  with a four layer compression wrap cut into his leg a couple weeks back. He did have a trip and actually had [employer] for the trip that he is worn since he was last here. Nonetheless he feels like the [employer] actually do better for him his swelling is up a little bit but he also with his trip was not taking his Lasix on a regular set schedule like he was supposed to be. He states that obviously the reason being that he cannot drive and keep going without having to urinate too frequently which makes it difficult. He did not have his pumps with him while he was away either which I think also maybe playing a role here too. 08/13/2018; the patient only has a small open wound on the right lateral calf which is a big improvement in the last month or 2. He also has the area posteriorly just below the posterior fossa on the left which I think was a wrap injury from several weeks ago. He has no current evidence of cellulitis. He tells me he is back into his compression pumps twice a day. He also tells me that while he was at the laundromat somebody stole a section of his extremitease stockings 08/20/2018; back in the clinic with a much improved state. He only has small areas on the right lateral mid calf which is just about healed. This was is more substantial area for quite a prolonged period of time. He has a small open area on the left anterior tibia. The area on the posterior calf just below the popliteal fossa is closed today. He is using his compression pumps twice a day 08/28/2018; patient has no open wound on the right leg. He has a smattering of open areas on the calf with some  weeping lymphedema. More problematically than that it looks as though his wraps of slipped down in his usual he has very angry upper area of edema just below the right medial knee and on the right lateral calf. He has no open area on his feet. The patient is traveling to [facility] next week. I will send him in an antibiotic. We will continue to wrap the right leg. We ordered extremitease stockings for him last week and I plan to transition the right leg to a stocking when he gets home which will be in 10 days time. As usual he is very reluctant to take his pumps with him when he travels 09/07/2018; patient returns from [facility]. He shows me a picture of his left leg in the mid part of his trip last week with intense fire engine erythema. The picture look bad enough I would have considered sending him to the hospital. Instead he went to the wound care center in [facility]. They did not prescribe him antibiotics but he did take some doxycycline he had leftover from a previous visit. I had given him trimethoprim sulfamethoxazole before he left this did not work according to the patient. This is resulted in some improvement fortunately. He comes back with a large wound on the left posterior calf. Smaller area on the left anterior tibia. Denuded blisters on the dorsal left foot over his toes. Does not have much in the way of wounds on the right leg although he does have a very tender area on the right posterior area just below the popliteal fossa also suggestive of infection. He promises me he is back on his pumps twice a day  09/15/2018; the intense cellulitis in his left lower calf is a lot better. The wound area on the posterior left calf is also so better. However he has reasonably extensive wounds on the dorsal aspect of his second and third toes and the proximal foot just at the base of the toes. There is nothing open on the right leg 09/22/2018; the patient has excellent edema  control in his legs bilaterally. He is using his external compression pumps twice a day. He has no open area on the right leg and only the areas in the left foot dorsally second and third toe area on the left side. He does not have any signs of active cellulitis. 10/06/2018; the patient has good edema control bilaterally. He has no open wound on the right leg. There is a blister in the posterior aspect of his left calf that we had to deal with today. He is using his compression pumps twice a day. There is no signs of active cellulitis. We have been using silver alginate to the wound areas. He still has vulnerable areas on the base of his left first second toes dorsally He has a his extremities stockings and we are going to transition him today into the stocking on the right leg. He is cautioned that he will need to continue to use the compression pumps twice a day. If he notices uncontrolled edema in the right leg he may need to go to 3 times a day. 10/13/2018; the patient came in for a nurse check on Friday he has a large flaccid blister on the right medial calf just below the knee. We unroofed this. He has this and a new area underneath the posterior mid calf which was undoubtedly a blister as well. He also has several small areas on the right which is the area we put his extremities stocking on. 10/19/2018; the patient went to see infectious disease this morning I am not sure if that was a routine follow-up in any case the doxycycline I had given him was discontinued and started on linezolid. He has not started this. It is easy to look at his left calf and the inflammation and think this is cellulitis however he is very tender in the tissue just below the popliteal fossa and I have no doubt that there is infection going on here. He states the problem he is having is that with the compression pumps the edema goes down and then starts walking the wrap falls down. We will see if we can adhere this. He  has 1 or 2 minuscule open areas on the right still areas that are weeping on the posterior left calf, the base of his left second and third toes 10/26/18; back today in clinic with quite of skin breakdown in his left anterior leg. This may have been infection the area below the popliteal fossa seems a lot better however tremendous epithelial loss on the left anterior mid tibia area over quite inexpensive tissue. He has 2 blisters on the right side but no other open wound here. 10/29/2018; came in urgently to see [place] today and we worked him in for review. He states that the 4 layer compression on the right leg caused pain he had to cut it down to roughly his mid calf this caused swelling above the wrap and he has blisters and skin breakdown today. As a result of the pain he has not been using his pumps. Both legs are a lot more edematous and there is  a lot of weeping fluid. 11/02/18; arrives in clinic with continued difficulties in the right leg> left. Leg is swollen and painful. multiple skin blisters and new open areas especially laterally. He has not been using his pumps on the right leg. 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 He is not systemically unwell. 11/09/2018; the patient claims he is being compliant with his pumps. He is finished the doxycycline I gave him last week. Culture I did of the wound on the right lateral leg showed a few very resistant methicillin staph aureus. This was resistant to doxycycline. Nevertheless he states the pain in the leg is a lot better which makes me wonder if the cultured organism was not really what was causing the problem nevertheless this is a very dangerous organism to be culturing out of any wound. His right leg is still a lot larger than the left. He is using an Radio broadcast assistant on this area, he blames a 4-layer compression for causing the original skin breakdown which I doubt is true however I cannot talk him out  of it. We have been using silver alginate to all of these areas which were initially blisters 11/16/2018; patient is being compliant with his external compression pumps at twice a day. Miraculously he arrives in clinic today with absolutely no open wounds. He has better edema control on the left where he has been using 4 layer compression versus wound of wounds on the right and I pointed this out to him. There is no inflammation in the skin in his lower legs which is also somewhat unusual for him. There is no open wounds on the dorsal left foot. He has extremitease stockings at home and I have asked him to bring these in next week. 11/25/18 patient's lower extremity on examination today on the left appears for the most part to be wound free. He does have an open wound on the lateral aspect of the right lower extremity but this is minimal compared to what I've seen in past. He does request that we go ahead and wrap the left leg as well even though there's nothing open just so hopefully it will not reopen in short order. 1/28; patient has superficial open wounds on the right lateral calf left anterior calf and left posterior calf. His edema control is adequate. 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 We have been using silver alginate as the primary dressing. He claims compliance with his compression pumps 2/4; patient has superficial open wounds on numerous areas of his left calf and again one on the left dorsal foot. The areas on the right lateral calf have healed. The cellulitis that I gave him doxycycline for last week is also resolved this was mostly on the left anterior calf just below the tibial tuberosity. His edema looks fairly well-controlled. He tells me he went to see his primary doctor today and had blood work ordered 2/11; once again he has several open areas on the left calf left tibial area. Most of  these are small and appear to have healthy granulation. He does not have anything open on the right. The edema and control in his thighs is pretty good which is usually a good indication he has been using his pumps as requested. 2/18; he continues to have several small areas on the left calf and left tibial area. Most of these  are small healthy granulation. We put him in his stocking on the right leg last week and he arrives with a superficial open area over the right upper tibia and a fairly large area on the right lateral tibia in similar condition. His edema control actually does not look too bad, he claims to be using his compression pumps twice a day 2/25. Continued small areas on the left calf and left tibial area. New areas especially on the right are identified just below the tibial tuberosity and on the right upper tibia itself. There are also areas of weeping edema fluid even without an obvious wound. He does not have a considerable degree of lymphedema but clearly there is more edema here than his skin can handle. He states he is using the pumps twice a day. We have an Unna boot on the right and 4 layer compression on the left. 3/3; he continues to have an area on the right lateral calf and right posterior calf just below the popliteal fossa. There is a fair amount of tenderness around the wound on the popliteal fossa but I did not see any evidence of cellulitis, could just be that the wrap came down and rubbed in this area. He does not have an open area on the left leg however there is an area on the left dorsal foot at the base of the third toe We have been using silver alginate to all wound areas 3/10; he did not have an open area on his left leg last time he was here a week ago. T oday he arrives with a horizontal wound just below the tibial tuberosity and an area on the left lateral calf. He has intense erythema and tenderness in this area. The area is on the right lateral calf and  right posterior calf better than last week. We have been using silver alginate as usual 3/18 - Patient returns with 3 small open areas on left calf, and 1 small open area on right calf, the skin looks ok with no significant erythema, he continues the UNA boot on right and 4 layer compression on left. The right lateral calf wound is closed , the right posterior is small area. we will continue silver alginate to the areas. 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 He is been tolerating the dressing changes without complication. Fortunately she has made excellent improvement in regard to the overall status of his wounds. Unfortunately every time we cease wrapping him he ends up reopening in causing more significant issues at that point. Again I'm unsure of the best direction to take although I think the lymphedema clinic may be appropriate for him. 02/03/19 on evaluation today patient appears to be doing well in regard to the wounds that we saw him for last week unfortunately he has a new area on the proximal portion of his right medial/posterior lower extremity where the wrap somewhat slowed down and caused swelling and a blister to rub and open. Unfortunately this is the only opening that he has on either leg at this point. 02/17/19 on evaluation today patient's bilateral lower extremities appear to be doing well. He still completely healed in regard to the left lower extremity. In regard to the right lower extremity the area where the wrap and slid down and caused the blister  still seems to be slightly open although this is dramatically better than during the last evaluation two weeks ago. I'm very pleased with the way this stands overall. 03/03/19 on evaluation today patient appears to be doing well in regard  to his right lower extremity in general although he did have a new blister open this does not appear to be showing any evidence of active infection at this time. Fortunately there's No fevers, chills, nausea, or vomiting noted at this time. Overall I feel like he is making good progress it does feel like that the right leg will we perform the [employer] seems to do with a bit better than three layer wrap on the left which slid down on him. We may switch to doing bilateral in the book wraps. 5/4; I have not seen Mr. Pareja in quite some time. According to our case manager he did not have an open wound on his left leg last week. He had 1 remaining wound on the right posterior medial calf. He arrives today with multiple openings on the left leg probably were blisters and/or wrap injuries from Unna boots. I do not think the Unna boot's will provide adequate compression on the left. I am also not clear about the frequency he is using the compression pumps. 03/17/19 on evaluation today patient appears to be doing excellent in regard to his lower extremities compared to last week's evaluation apparently. He had gotten significantly worse last week which is unfortunate. The [employer] wrap on the left did not seem to do very well for him at all and in fact it didn't control his swelling significantly enough he had an additional outbreak. Subsequently we go back to the four layer compression wrap on the left. This is good news. At least in that he is doing better and the wound seem to be killing him. He still has not heard anything from the lymphedema clinic. 03/24/19 on evaluation today patient actually appears to be doing much better in regard to his bilateral lower Trinity as compared to last week when I saw him. Fortunately there's no signs of active infection at this time. He has been tolerating the dressing changes without complication. Overall I'm extremely pleased with the progress and appearance in  general. 04/07/19 on evaluation today patient appears to be doing well in regard to his bilateral lower extremities. His swelling is significantly down from where it was previous. With that being said he does have a couple blisters still open at this point but fortunately nothing that seems to be too severe and again the majority of the larger openings has healed at this time. 04/14/19 on evaluation today patient actually appears to be doing quite well in regard to his bilateral lower extremities in fact I'm not even sure there's anything significantly open at this time at any site. Nonetheless he did have some trouble with these wraps where they are somewhat irritating him secondary to the fact that he has noted that the graph wasn't too close down to the end of this foot in a little bit short as well up to his knee. Otherwise things seem to be doing quite well. 04/21/19 upon evaluation today patient's wound bed actually showed evidence of being completely healed in regard to both lower extremities which is excellent news. There does not appear to be any signs of active infection which is also good news. I'm very pleased in this regard. No fevers, chills, nausea, or vomiting noted at this time. 04/28/19  on evaluation today patient appears to be doing a little bit worse in regard to both lower extremities on the left mainly due to the fact that when he went infection disease the wrap was not wrapped quite high enough he developed a blister above this. On the right he is a small open area of nothing too significant but again this is continuing to give him some trouble he has been were in the Velcro compression that he has at home. 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 He's been tolerating the dressing changes without complication. Fortunately there's no signs of active infection at this time. No fevers, chills, nausea, or vomiting noted at  this time. We have been trying to get an appointment with her lymphedema clinic in [facility] but unfortunately nobody can get them on phone with not been able to even fax information over the patient likewise is not been able to get in touch with them. Overall I'm not sure exactly what's going on here with to reach out again today. 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 Still having a lot of drainage unfortunately. He tells me especially in the left but even on the right. There's no signs of active infection which is good news we've been using so ratcheted up to this point. 05/19/19 on evaluation today patient actually appears to be doing quite well with regard to his left lower extremity which is great news. Fortunately in regard to the right lower extremity has an issues with his wrap and he subsequently did remove this from what I'm understanding. Nonetheless long story short is what he had rewrapped once he removed it subsequently had maggots underneath this wrap whenever he came in for evaluation today. With that being said they were obviously completely cleaned away by the nursing staff. The visit today which is excellent news. However he does appear to potentially have some infection around the right ankle region where the maggots were located as well. He will likely require anabiotic therapy today. 05/26/19 on evaluation today patient actually appears to be doing much better in regard to his bilateral lower extremities. I feel like the infection is under much better control. With that being said there were maggots noted when the wrap was removed yet again today. Again this could have potentially been left over from previous although at this time there does not appear to be any signs of significant drainage there was obviously on the wrap some drainage as well this contracted gnats or  otherwise. Either way I do not see anything that appears to be doing worse in my pinion and in fact I think his drainage has slowed down quite significantly likely mainly due to the fact to his infection being under better control. 06/02/2019 on evaluation today patient actually appears to be doing well with regard to his bilateral lower extremities there is no signs of active infection at this time which is great news. With that being said he does have several open areas more so on the right than the left but nonetheless these are all significantly better than previously noted. 06/09/2019 on evaluation today patient actually appears to be doing well. His wrap stayed up and he did not cause any problems he had more drainage on the right compared to the left but overall I do not see any major issues at  this time which is great news. 06/16/2019 on evaluation today patient appears to be doing excellent with regard to his lower extremities the only area that is open is a new blister that can have opened as of today on the medial ankle on the left. Other than this he really seems to be doing great I see no major issues at this point. 06/23/2019 on evaluation today patient appears to be doing quite well with regard to his bilateral lower extremities. In fact he actually appears to be almost completely healed there is a small area of weeping noted of the right lower extremity just above the ankle. Nonetheless fortunately there is no signs of active infection at this time which is good news. No fevers, chills, nausea, vomiting, or diarrhea. 8/24; the patient arrived for a nurse visit today but complained of very significant pain in the left leg and therefore I was asked to look at this. Noted that he did not have an open area on the left leg last week nevertheless this was wrapped. The patient states that he is not been able to put his compression pumps on the left leg because of the discomfort. He has not been  systemically unwell 06/30/2019 on evaluation today patient unfortunately despite being excellent last week is doing much worse with regard to his left lower extremity today. In fact he had to come in for a nurse on Monday where his left leg had to be rewrapped due to excessive weeping Dr. Leanord Hawking placed him on doxycycline at that point. Fortunately there is no signs of active infection Systemically at this time which is good news. 07/07/2019 in regard to the patient's wounds today he actually seems to be doing well with his right lower extremity there really is nothing open or draining at this point this is great news. Unfortunately the left lower extremity is given him additional trouble at this time. There does not appear to be any signs of active infection nonetheless he does have a lot of edema and swelling noted at this point as well as blistering all of which has led to a much more poor appearing leg at this time compared to where it was 2 weeks ago when it was almost completely healed. Obviously this is a little discouraging for the patient. He is try to contact the lymphedema clinic in [place] he has not been able to get through to them. 07/14/2019 on evaluation today patient actually appears to be doing slightly better with regard to his left lower extremity ulcers. Overall I do feel like at least at the top of the wrap that we have been placing this area has healed quite nicely and looks much better. The remainder of the leg is showing signs of improvement. Unfortunately in the thigh area he still has an open region on the left and again on the right he has been utilizing just a Band-Aid on an area that also opened on the thigh. Again this is an area that were not able to wrap although we did do an Ace wrap to provide some compression that something that obviously is a little less effective than the compression wraps we have been using on the lower portion of the leg. He does have an  appointment with the lymphedema clinic in [facility] on Friday. 07/21/2019 on evaluation today patient appears to be doing better with regard to his lower extremity ulcers. He has been tolerating the dressing changes without complication. Fortunately there is no signs of active  infection at this time. No fevers, chills, nausea, vomiting, or diarrhea. I did receive the paperwork from the physical therapist at the lymphedema clinic in [place]. Subsequently I signed off on that this morning and sent that back to him for further progression with the treatment plan. 07/28/2019 on evaluation today patient appears to be doing very well with regard to his right lower extremity where I do not see any open wounds at this point. Fortunately he is feeling great as far as that is concerned as well. In regard to the left lower extremity he has been having issues with still several areas of weeping and edema although the upper leg is doing better his lower leg still I think is going require the compression wrap at this time. No fevers, chills, nausea, vomiting, or diarrhea. 08/04/2019 on evaluation today patient unfortunately is having new wounds on the right lower extremity. Again we have been using Unna boot wrap on that side. We switched him to using his juxta lite wrap at home. With that being said he tells me he has been using it although his legs extremely swollen and to be honest really does not appear that he has been. I cannot know that for sure however. Nonetheless he has multiple new wounds on the right lower extremity at this time. Obviously we will have to see about getting this rewrapped for him today. 08/11/2019 on evaluation today patient appears to be doing fairly well with regard to his wounds. He has been tolerating the dressing changes including the compression wraps without complication. He still has a lot of edema in his upper thigh regions bilaterally he is supposed to be seeing the  lymphedema clinic on the 15th of this month once his wraps arrive for the upper part of his legs. 08/18/2019 on evaluation today patient appears to be doing well with regard to his bilateral lower extremities at this point. He has been tolerating the dressing changes without complication. Fortunately there is no signs of active infection which is also good news. He does have a couple weeping areas on the first and second toe of the right foot he also has just a small area on the left foot upper leg and a small area on the left lower leg but overall he is doing quite well in my opinion. He is supposed to be getting his wraps shortly in fact tomorrow and then subsequently is seeing the lymphedema clinic next Wednesday on the 21st. Of note he is also leaving on the 25th to go on vacation [place] a [place] to the beach. For that reason and since there is some uncertainty about what there can be doing at lymphedema clinic next Wednesday I am get a make an appointment for next Friday here for [place] to see what we need to do for him prior to him leaving for vacation. 10/23; patient arrives in considerable pain predominantly in the upper posterior calf just distal to the popliteal fossa also in the wound anteriorly above the major wound. This is probably cellulitis and he has had this recurrently in the past. He has no open wound on the right side and he has had an Radio broadcast assistant in that area. Finally I note that he has an area on the left posterior calf which by enlarge is mostly epithelialized. This protrudes beyond the borders of the surrounding skin in the setting of dry scaly skin and lymphedema. The patient is leaving for [facility] on Sunday. Per his longstanding pattern, he  will not take his compression pumps with him predominantly out of fear that they will be stolen. He therefore asked that we put a Unna boot back on the right leg. He will also [place] if they can  change his dressing in the mid week. 11/3; [place] He was seen on 1 occasion at their wound care center. They did a 2 layer compression system as they did not have our 4-layer wrap. I am not completely certain what they put on the wounds. They did not change the Unna boot on the right. The patient is also seeing a lymphedema specialist [place]. It appears that he has some compression sleeve for his thighs which indeed look quite a bit better than I am used to seeing. He pumps over these with his external compression pumps. 11/10; the patient has a new wound on the right medial thigh otherwise there is no open areas on the right. He has an area on the left leg posteriorly anteriorly and medially and an area over the left second toe. We have been using silver alginate. He thinks the injury on his thigh is secondary to friction from the compression sleeve he has. 11/17; the patient has a new wound on the right medial thigh last week. He thinks this is because he did not have a underlying stocking for his thigh juxta lite apparatus. He now has this. The area is fairly large and somewhat angry but I do not think he has underlying cellulitis. He has a intact blister on the right anterior tibial area. Small wound on the right great toe dorsally Small area on the medial left calf. 11/30; the patient does not have any open areas on his right leg and we did not take his juxta lite stocking off. However he states that on Friday his compression wrap fell down lodging around his upper mid calf area. As usual this creates a lot of problems for him. He called urgently today to be seen for a nurse visit however the nurse visit turned into a provider visit because of extreme erythema and pain in the left anterior tibia extending laterally and posteriorly. The area that is problematic is extensive 10/06/2019 upon evaluation today patient actually  appears to be doing poorly in regard to his left lower extremity. He Dr. Leanord Hawkingobson did place him on doxycycline this past Monday apparently due to the fact that he was doing much worse in regard to this left leg. Fortunately the doxycycline does seem to be helping. Unfortunately we are still having a very difficult time getting his edema under any type of control in order to anticipate discharge at some point. The only way were really able to control his lymphedema really is with compression wraps and that has only even seemingly temporary. He has been seeing a lymphedema clinic they are trying to help in this regard but still this has been somewhat frustrating in general for the patient. 10/13/19 on evaluation today patient appears to be doing excellent with regard to his right lower extremity as far as the wounds are concerned. His swelling is still quite extensive unfortunately. He is still having a lot of drainage from the thigh areas bilaterally which is unfortunate. He's been going to lymphedema clinic but again he still really does not have this edema under control as far as his lower extremities are concern. With regard to his left lower  extremity this seems to be improving and I do believe the doxycycline has been of benefit for him. He is about to complete the doxycycline. 10/20/2019 on evaluation today patient appears to be doing poorly in regard to his bilateral lower extremities. More in the right thigh he has a lot of irritation at this site unfortunately. In regard to the left lower extremity the wrap was not quite as high it appears and does seem to have caused him some trouble as well. Fortunately there is no evidence of systemic infection though he does have some blue-green drainage which has me concerned for the possibility of Pseudomonas. He tells me he is previously taking Cipro without complications and he really does not care for Levaquin however due to some of the side effects he  has. He is not allergic to any medications specifically antibiotics that were aware of. 10/27/2019 on evaluation today patient actually does appear to be for the most part doing better when compared to last week's evaluation. With that being said he still has multiple open wounds over the bilateral lower extremities. He actually forgot to start taking the Cipro and states that he still has the whole bottle. He does have several new blisters on left lower extremity today I think I would recommend he go ahead and take the Cipro based on what I am seeing at this point. 12/30-Patient comes at 1 week visit, 4 layer compression wraps on the left and Unna boot on the right, primary dressing Xtrasorb and silver alginate. Patient is taking his Cipro and has a few more days left probably 5-6, and the legs are doing better. He states he is using his compressions devices which I believe he has 11/10/2019 on evaluation today patient actually appears to be much better than last time I saw him 2 weeks ago. His wounds are significantly improved and overall I am very pleased in this regard. Fortunately there is no signs of active infection at this time. He is just a couple of days away from completing Cipro. Overall his edema is much better he has been using his lymphedema pumps which I think is also helping at this point. 11/17/2019 on evaluation today patient appears to be doing excellent in regard to his wounds in general. His legs are swollen but not nearly as much as they have been in the past. Fortunately he is tolerating the compression wraps without complication. No fevers, chills, nausea, vomiting, or diarrhea. He does have some erythema however in the distal portion of his right lower extremity specifically around the forefoot and toes there is a little bit of warmth here as well. 11/24/2019 on evaluation today patient appears to be doing well with regard to his right lower extremity I really do not see any open  wounds at this point. His left lower extremity does have several open areas and his right medial thigh also is open. Other than this however overall the patient seems to be making good progress and I am very pleased at this point. 12/01/2019 on evaluation today patient appears to be doing poorly at this point in regard to his left lower extremity has several new blisters despite the fact that we have him in compression wraps. In fact he had a 4-layer compression wrap, his upper thigh wrapped from lymphedema clinic, and a juxta light over top of the 4 layer compression wrap the lymphedema clinic applied and despite all this he still develop blisters underneath. Obviously this does have me concerned about  the fact that unfortunately despite what we are doing to try to get wounds healed he continues to have new areas arise I do not think he is ever good to be at the point where he can realistically just use wraps at home to keep things under control. Typically when we heal him it takes about 1-2 days before he is back in the clinic with severe breakdown and blistering of his lower extremities bilaterally. This is happened numerous times in the past. Unfortunately I think that we may need some help as far as overall fluid overload to kind of limit what we are seeing and get things under better control. 12/08/2019 on evaluation today patient presents for follow-up concerning his ongoing bilateral lower extremity edema. Unfortunately he is still having quite a bit of swelling the compression wraps are controlling this to some degree but he did see Dr. [name] his cardiologist I do have that available for review today as far as the appointment was concerned that was on 12/06/2019. Obviously that she has been 2 days ago. The patient states that he is only been taking the Lasix 80 mg 1 time a day he had told me previously he was taking this twice a day. Nonetheless Dr. [name] recommended this be up to 80 mg 2 times a  day for the patient as he did appear to be fluid overloaded. With that being said the patient states he did this yesterday and he was unable to go anywhere or do anything due to the fact that he was constantly having to urinate. Nonetheless I think that this is still good to be something that is important for him as far as trying to get his edema under control at all things that he is going to be able to just expect his wounds to get under control and things to be better without going through at least a period of time where he is trying to stabilize his fluid management in general and I think increasing the Lasix is likely the first step here. It was also mentioned the possibility that the patient may require metolazone. With that being said he wanted to have the patient take Lasix twice a day first and then reevaluating 2 months to see where things stand. 12/15/2019 upon evaluation today patient appears to be doing regard to his legs although his toes are showing some signs of weeping especially on the left at this point to some degree on the right. There does not appear to be any signs of active infection and overall I do feel like the compression wraps are doing well for him but he has not been able to take the Lasix at home and the increased dose that Dr. [name] recommended. He tells me that just not go to be feasible for him. Nonetheless I think in this case he should probably send a message to Dr. [name] in order to discuss options from the standpoint of possible admission to get the fluid off or otherwise going forward. 12/22/2019 upon evaluation today patient appears to be doing fairly well with regard to his lower extremities at this point. In fact he would be doing excellent if it was not for the fact that his right anterior thigh apparently had an allergic reaction to adhesive tape that he used. The wound itself that we have been monitoring actually appears to be healed. There is a lot of  irritation at this point. 12/29/2019 upon evaluation today patient appears to be doing well in  regard to his lower extremities. His left medial thigh is open and somewhat draining today but this is the only region that is open the right has done much better with the treatment utilizing the steroid cream that I prescribed for him last week. Overall I am pleased in that regard. Fortunately there is no signs of active infection at this time. No fevers, chills, nausea, vomiting, or diarrhea. 01/05/2020 upon evaluation today patient appears to be doing more poorly in regard to his right lower extremity at this point upon evaluation today. Unfortunately he continues to have issues in this regard and I think the biggest issue is controlling his edema. This obviously is not very well controlled at this point is been recommended that he use the Lasix twice a day but he has not been able to do that. Unfortunately I think this is leading to an issue where honestly he is not really able to effectively control his edema and therefore the wounds really are not doing significantly better. I do not think that he is going to be able to keep things under good control unless he is able to control his edema much better. I discussed this again in great detail with him today. 01/12/2020 good news is patient actually appears to be doing quite well today at this point. He does have an appointment with lymphedema clinic tomorrow. His legs appear healed and the toe on the left is almost completely healed. In general I am very pleased with how things stand at this point. 01/19/2020 upon evaluation today patient appears to actually be doing well in regard to his lower extremities there is nothing open at this point. Fortunately he has done extremely well more recently. Has been seeing lymphedema clinic as well. With that being said he has Velcro wraps for his lower legs as well as his upper legs. The only wound really is on his toe  which is the right great toe and this is barely anything even there. With all that being said I think it is good to be appropriate today to go ahead and switch him over to the Velcro compression wraps. 01/26/2020 upon evaluation today patient appears to be doing worse with regard to his lower extremities after last week switch him to Velcro compression wraps. Unfortunately he lasted less than 24 hours he did not have the sock portion of his Velcro wrap on the left leg and subsequently developed a blister underneath the Velcro portion. Obviously this is not good and not what we were looking for at this point. He states the lymphedema clinic did tell him to wear the wrap for 23 hours and take him off for 1 I am okay with that plan but again right now we got a get things back under control again he may have some cellulitis noted as well. 02/02/2020 upon evaluation today patient unfortunately appears to have several areas of blistering on his bilateral lower extremities today mainly on the feet. His legs do seem to be doing somewhat better which is good news. Fortunately there is no evidence of active infection at this time. No fevers, chills, nausea, vomiting, or diarrhea. 02/16/2020 upon evaluation today patient appears to be doing well at this time with regard to his legs. He has a couple weeping areas on his toes but for the most part everything is doing better and does appear to be sealed up on his legs which is excellent news. We can continue with wrapping him at this point as  he had every time we discontinue the wraps he just breaks out with new wounds. There is really no point in is going forward with this at this point. 03/08/2020 upon evaluation today patient actually appears to be doing quite well with regard to his lower extremity ulcers. He has just a very superficial and really almost nonexistent blister on the left lower extremity he has in general done very well with the compression wraps. With  that being said I do not see any signs of infection at this time which is good news. 03/29/2020 upon evaluation today patient appears to be doing well with regard to his wounds currently except for where he had several new areas that opened up due to some of the wrap slipping and causing him trouble. He states he did not realize they had slipped. Nonetheless he has a 1 area on the right and 3 new areas on the left. Fortunately there is no signs of active infection at this time which is great news. 04/05/2020 upon evaluation today patient actually appears to be doing quite well in general in regard to his legs currently. Fortunately there is no signs of active infection at this time. No fevers, chills, nausea, vomiting, or diarrhea. He tells me next week that he will actually be seen in the lymphedema clinic on Thursday at 10 AM I see him on Wednesday next week. 04/12/2020 upon evaluation today patient appears to be doing very well with regard to his lower extremities bilaterally. In fact he does not appear to have any open wounds at this point which is good news. Fortunately there is no signs of active infection at this time. No fevers, chills, nausea, vomiting, or diarrhea. 04/19/2020 upon evaluation today patient appears to be doing well with regard to his wounds currently on the bilateral lower extremities. There does not appear to be any signs of active infection at this time. Fortunately there is no evidence of systemic infection and overall very pleased at this point. Nonetheless after I held him out last week he literally had blisters the next morning already which swelled up with him being right back here in the clinic. Overall I think that he is just not can be able to be discharged with his legs the way they are he is much to volume overloaded as far as fluid is concerned and that was discussed with him today of also discussed this but should try the clinic nurse manager as well as Dr.  Leanord Hawking. 04/26/2020 upon evaluation today patient appears to be doing better with regard to his wounds currently. He is making some progress and overall swelling is under good control with the compression wraps. Fortunately there is no evidence of active infection at this time. 05/10/2020 on evaluation today patient appears to be doing overall well in regard to his lower extremities bilaterally. He is Tolerating the compression wraps without complication and with what we are seeing currently I feel like that he is making excellent progress. There is no signs of active infection at this time. 05/24/2020 upon evaluation today patient appears to be doing well in regard to his legs. The swelling is actually quite a bit down compared to where it has been in the past. Fortunately there is no sign of active infection at this time which is also good news. With that being said he does have several wounds on his toes that have opened up at this point. 05/31/2020 upon evaluation today patient appears to be doing well with  regard to his legs bilaterally where he really has no significant fluid buildup at this point overall he seems to be doing quite well. Very pleased in this regard. With regard to his toes these also seem to be drying up which is excellent. We have continue to wrap him as every time we tried as a transition to the juxta light wraps things just do not seem to get any better. 06/07/2020 upon evaluation today patient appears to be doing well with regard to his right leg at this point. Unfortunately left leg has a lot of blistering he tells me the wrap started to slide down on him when he tried to put his other Velcro wrap over top of it to help keep things in order but nonetheless still had some issues. 06/14/2020 on evaluation today patient appears to be doing well with regard to his lower extremity ulcers and foot ulcers at this point. I feel like everything is actually showing signs of improvement which  is great news overall there is no signs of active infection at this time. No fevers, chills, nausea, vomiting, or diarrhea. 06/21/2020 on evaluation today patient actually appears to be doing okay in regard to his wounds in general. With that being said the biggest issue I see is on his right foot in particular the first and second toe seem to be doing a little worse due to the fact this is staying very wet. I think he is probably getting need to change out his dressings a couple times in between each week when we see him in regard to his toes in order to keep this drier based on the location and how this is proceeding. 06/28/2020 on evaluation today patient appears to be doing a little bit more poorly overall in regard to the appearance of the skin I am actually somewhat concerned about the possibility of him having a little bit of an infection here. We discussed the course of potentially giving him a doxycycline prescription which he is taken previously with good result. With that being said I do believe that this is potentially mild and at this point easily fixed. I just do not want anything to get any worse. 07/12/2020 upon evaluation today patient actually appears to be making some progress with regard to his legs which is great news there does not appear to be any evidence of active infection. Overall very pleased with where things stand. 07/26/2020 upon evaluation today patient appears to be doing well with regard to his leg ulcers and toe ulcers at this point. He has been tolerating the compression wraps without complication overall very pleased in this regard. 08/09/2020 upon evaluation today patient appears to be doing well with regard to his lower extremities bilaterally. Fortunately there is no signs of active infection overall I am pleased with where things stand. 08/23/2020 on evaluation today patient appears to be doing well with regard to his wound. He has been tolerating the dressing  changes without complication. Fortunately there is no signs of active infection at this time. Overall his legs seem to be doing quite well which is great news and I am very pleased in that regard. No fevers, chills, nausea, vomiting, or diarrhea. 09/13/2020 upon evaluation today patient appears to be doing okay in regard to his lower extremities. He does have a fairly large blister on the right leg which I did remove the blister tissue from today so we can get this to dry out other than that however he seems  to be doing quite well. There is no signs of active infection at this time. 09/27/2020 upon evaluation today patient appears to actually be doing some better in regard to his right leg. Fortunately signs of active infection at this time which is great news. No fevers, chills, nausea, vomiting, or diarrhea. 10/04/2020 upon evaluation today patient actually appears to be showing signs of improvement which is great news with regard to his leg ulcers. Fortunately there is no signs of active infection which is great news he is still taking the antibiotics currently. No fevers, chills, nausea, vomiting, or diarrhea. 10/18/2020 on evaluation today patient appears to be doing well with regard to his legs currently. He has been tolerating the dressing changes including the wraps without complication. Fortunately there is no signs of active infection at this time. No fevers, chills, nausea, vomiting, or diarrhea. 10/25/2020 upon evaluation today patient appears to be doing decently well in regard to his wounds currently. He has been tolerating the dressing changes without complication. Overall I feel like he is making good progress albeit slow. Again this is something we can have to continue to wrap for some time to come most likely. 11/08/2020 upon evaluation today patient appears to be doing well with regard to his wounds currently. He has been tolerating the dressing changes without complication is not  currently on any antibiotics and he does not appear to show any signs of infection. He does continue to have a lot of drainage on the right leg not too severe but nonetheless this is very scattered. On the left leg this is looking to be much improved overall. 11/15/2020 upon evaluation today patient appears to be doing better with regard to his legs bilaterally. Especially the right leg which was much more significant last week. There does not appear to be any signs of active infection which is great news. No fevers, chills, nausea, vomiting, or diarrhea. 11/23/2019 upon evaluation today patient appears to be doing poorly still in regard to his lower extremities bilaterally. Unfortunately his right leg in particular appears to be doing much more poorly there is no signs really of infection this is not warm to touch but he does have a lot of drainage and weeping unfortunately. With that reason I do believe that we may need to initiate some treatment here to try to help calm down some of the swelling of the right leg. I think switching to a 4-layer compression wrap would be beneficial here. The patient is in agreement with giving this a try. 11/29/2020 upon evaluation today patient appears to be doing well currently in regard to his leg ulcers. I feel like the right leg is doing better he still has a lot of drainage but we do see some improvement here. The 4-layer compression wrap I think was helpful. 12/06/2020 upon evaluation today patient appears to be doing well with regard to his legs. In fact they seem to be doing about the best I have seen up to this point. Fortunately there is no signs of active infection at this time. No fevers, chills, nausea, vomiting, or diarrhea. 12/20/2020 upon evaluation today patient appears to be doing well at this time in regard to his legs. He is not having any significant draining which is great news. Fortunately there is no signs of active infection at this time. No fevers,  chills, nausea, vomiting, or diarrhea. 01/17/2021 upon evaluation today Jamaris actually appears to be doing excellent in regard to his legs. He has a few  areas again that come and go as far as his toes are concerned but overall this is doing quite well. Electronic Signature(s) Signed: 01/17/2021 11:12:26 AM By: Lenda Kelp PA-C Entered By: Lenda Kelp on 01/17/2021 11:12:26 -------------------------------------------------------------------------------- Physical Exam Details Patient Name: Date of Service: [name], [name] 01/17/2021 10:30 A M Medical Record Number: [id number] Patient Account Number: [id number] Date of Birth/Sex: Treating RN: [birth year] (70 y.o. [name] Primary Care Provider: Nicoletta Ba Other Clinician: Referring Provider: Treating Provider/Extender: Adele Dan in Treatment: 260 Constitutional Obese and well-hydrated in no acute distress. Respiratory normal breathing without difficulty. Psychiatric this patient is able to make decisions and demonstrates good insight into disease process. Alert and Oriented x 3. pleasant and cooperative. Notes Upon inspection patient's wound bed actually showed signs of good granulation at this point. There does not appear to be any evidence of infection this is in regard to the toes and overall I feel like he is managing quite nicely. I do think that the compression wrap still are doing a great job for him where he is in bilateral 4-layer compression wraps. Electronic Signature(s) Signed: 01/17/2021 11:12:48 AM By: Lenda Kelp PA-C Entered By: Lenda Kelp on 01/17/2021 11:12:48 -------------------------------------------------------------------------------- Physician Orders Details Patient Name: Date of Service: [name], [name] 01/17/2021 10:30 A M Medical Record Number: [id number] Patient Account Number: [id number] Date of Birth/Sex: Treating RN: [birth year] (70 y.o. 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 - MD Return A Nurse Visit: - 1 week Bathing/ Shower/ Hygiene May shower with protection but do not get wound dressing(s) wet. Edema Control - Lymphedema / SCD / Other Bilateral Lower Extremities Lymphedema Pumps. Use Lymphedema pumps on leg(s) 2-3 times a day for 45-60 minutes. If wearing any wraps or hose, do not remove them. Continue exercising as instructed. Elevate legs to the level of the heart or above for 30 minutes daily and/or when sitting, a frequency of: - throughout the day Avoid standing for long periods of time. Exercise regularly Non Wound Condition Bilateral Lower Extremities Other Non Wound Condition Orders/Instructions: - lotion to both legs, 4 layer compression wraps right and left legs Wound Treatment Wound #192 - [place] Wound Laterality: Left Prim Dressing: KerraCel Ag Gelling Fiber Dressing, 2x2 in (silver alginate) 1 x Per Week/7 Days ary Discharge Instructions: Apply silver alginate to wound bed as instructed Secondary Dressing: Woven Gauze Sponges 2x2 in 1 x Per Week/7 Days Discharge Instructions: Apply over primary dressing as directed. Secured With: Coban Self-Adherent Wrap 4x5 (in/yd) 1 x Per Week/7  Days Discharge Instructions: Secure with Coban as directed. Secured With: Insurance underwriter, Sterile 2x75 (in/in) 1 x Per Week/7 Days Discharge Instructions: Secure with stretch gauze as directed. Electronic Signature(s) Signed: 01/17/2021 6:22:43 PM By: Lenda Kelp PA-C Signed: 01/18/2021 5:44:19 PM By: Zenaida Deed RN, BSN Entered By: Zenaida Deed on 01/17/2021  11:12:59 -------------------------------------------------------------------------------- Problem List Details Patient Name: Date of Service: [name], [name] 01/17/2021 10:30 A M Medical Record Number: [id number] Patient Account Number: [id number] Date of Birth/Sex: Treating RN: 02/20/1951 (70 y.o. [name] Primary Care Provider: Nicoletta Ba Other Clinician: Referring Provider: Treating Provider/Extender: Adele Dan in Treatment: 260 Active Problems ICD-10 Encounter Code Description Active Date MDM Diagnosis 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 skin 06/30/2018 No Yes L97.221 Non-pressure chronic ulcer of left calf limited to breakdown of skin 09/30/2016 No Yes 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 of 01/22/2016 No Yes bilateral lower extremity I89.0 Lymphedema, not elsewhere classified 01/22/2016 No Yes E11.622 Type 2 diabetes mellitus with other skin ulcer 01/22/2016 No Yes 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, unspecified 01/22/2016 No Yes L03.116 Cellulitis of left lower limb 04/01/2017 No Yes Inactive Problems ICD-10 Code Description Active Date Inactive Date 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 skin 06/30/2017 06/30/2017 L97.521 Non-pressure chronic ulcer of other 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 of skin 04/27/2018 04/27/2018 L03.115 Cellulitis of right lower limb 12/22/2017 12/22/2017 L97.228 Non-pressure chronic ulcer of left  calf with other specified severity 06/30/2018 06/30/2018 L97.511 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 of skin 06/30/2018 06/30/2018 Resolved Problems Electronic Signature(s) Signed: 01/17/2021 10:30:00 AM By: Lenda Kelp PA-C Entered By: Lenda Kelp on 01/17/2021 10:30:00 -------------------------------------------------------------------------------- Progress Note Details Patient Name: Date of Service: [name], [name] 01/17/2021 10:30 A M Medical Record Number: [id number] Patient Account Number: [id number] Date of Birth/Sex: Treating RN: [birth year] (71 y.o. [name] Primary Care Provider: Nicoletta Ba Other Clinician: Referring Provider: Treating Provider/Extender: Adele Dan in Treatment: [phone] Subjective Chief Complaint Information obtained from Patient patient is here for evaluation venous/lymphedema weeping History of Present Illness (HPI) Referred by PCP for consultation. Patient has long standing history of BLE venous stasis, no prior ulcerations. At beginning of month, developed cellulitis and weeping. Received IM Rocephin followed by Keflex and resolved. Wears compression stocking, appr 6 months old. Not sure strength. No present drainage. 01/22/16 this is a patient who is a type II diabetic on insulin. 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 He tells me he religiously wears pressure stockings of uncertain strength. He was here with weeping edema about 8 months ago but did not have an open wound. Roughly a month ago he had a reopening on his bilateral legs. He is been using bandages and Neosporin. He does not complain of pain. 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 Last BUN/creatinine I have is from 11/20/15 at 13 and 1.0 respectively 01/29/16; patient  arrives today having tolerated the Profore wrap. He brought in his stockings and these are 18 mmHg stockings he bought from [place]. The compression here is likely inadequate. He does not complain of pain or excessive drainage she has no systemic symptoms. The wound on the right looks improved as does the one on the left although one on the left is more substantial with still tissue at risk below the actual wound area on the bilateral posterior calf 02/05/16; patient arrives with poor edema control. He states that we did put a 4 layer compression on it last week. No weight appear 5 this. 02/12/16; the  area on the posterior right Has healed. The left Has a substantial wound that has necrotic surface eschar that requires a debridement with a curette. 02/16/16;the patient called or a Nurse visit secondary to increased swelling. He had been in earlier in the week with his right leg healed. He was transitioned to is on pressure stocking on the right leg with the only open wound on the left, a substantial area on the left posterior calf. Note he has a history of severe lower extremity edema, he has a history of chronic atrial fibrillation but not heart failure per my notes but I'll need to research this. He is not complaining of chest pain shortness of breath or orthopnea. The intake nurse noted blisters on the previously closed right leg 02/19/16; this is the patient's regular visit day. I see him on Friday with escalating edema new wounds on the right leg and clear signs of at least right ventricular heart failure. I increased his Lasix to 40 twice a day. He is returning currently in follow-up. States he is noticed a decrease in that the edema 02/26/16 patient's legs have much less edema. There is nothing really open on the right leg. The left leg has improved condition of the large superficial wound on the posterior left leg 03/04/16; edema control is very much better. The patient's right leg wounds have healed.  On the left leg he continues to have severe venous inflammation on the posterior aspect of the left leg. There is no tenderness and I don't think any of this is cellulitis. 03/11/16; patient's right leg is married healed and he is in his own stocking. The patient's left leg has deteriorated somewhat. There is a lot of erythema around the wound on the posterior left leg. There is also a significant rim of erythema posteriorly just above where the wrap would've ended there is a new wound in this location and a lot of tenderness. Can't rule out cellulitis in this area. 03/15/16; patient's right leg remains healed and he is in his own stocking. The patient's left leg is much better than last review. His major wound on the posterior aspect of his left Is almost fully epithelialized. He has 3 small injuries from the wraps. Really. Erythema seems a lot better on antibiotics 03/18/16; right leg remains healed and he is in his own stocking. The patient's left leg is much better. The area on the posterior aspect of the left calf is fully epithelialized. His 3 small injuries which were wrap injuries on the left are improved only one seems still open his erythema has resolved 03/25/16; patient's right leg remains healed and he is in his own stocking. There is no open area today on the left leg posterior leg is completely closed up. His wrap injuries at the superior aspect of his leg are also resolved. He looks as though he has some irritation on the dorsal ankle but this is fully epithelialized without evidence of infection. 03/28/16; we discharged this patient on Monday. Transitioned him into his own stocking. There were problems almost immediately with uncontrolled swelling weeping edema multiple some of which have opened. He does not feel systemically unwell in particular no chest pain no shortness of breath and he does not feel 04/08/16; the edema is under better control with the Profore light wrap but he still has  pitting edema. There is one large wound anteriorly 2 on the medial aspect of his left leg and 3 small areas on the superior posterior calf.  Drainage is not excessive he is tolerating a Profore light well 04/15/16; put a Profore wrap on him last week. This is controlled is edema however he had a lot of pain on his left anterior foot most of his wounds are healed 04/22/16 once again the patient has denuded areas on the left anterior foot which he states are because his wrap slips up word. He saw his primary physician today is on Lasix 40 twice a day and states that he his weight is down 20 pounds over the last 3 months. 04/29/16: Much improved. left anterior foot much improved. He is now on Lasix 80 mg per day. Much improved edema control 05/06/16; I was hoping to be able to discharge him today however once again he has blisters at a low level of where the compression was placed last week mostly on his left lateral but also his left medial leg and a small area on the anterior part of the left foot. 05/09/16; apparently the patient went home after his appointment on 7/4 later in the evening developing pain in his upper medial thigh together with subjective fever and chills although his temperature was not taken. The pain was so intense he felt he would probably have to call 911. However he then remembered that he had leftover doxycycline from a previous round of antibiotics and took these. By the next morning he felt a lot better. He called and spoke to one of our nurses and I approved doxycycline over the phone thinking that this was in relation to the wounds we had previously seen although they were definitely were not. The patient feels a lot better old fever no chills he is still working. Blood sugars are reasonably controlled 05/13/16; patient is back in for review of his cellulitis on his anterior medial upper thigh. He is taking doxycycline this is a lot better. Culture I did of the nodular area on the  dorsal aspect of his foot grew MRSA this also looks a lot better. 05/20/16; the patient is cellulitis on the medial upper thigh has resolved. All of his wound areas including the left anterior foot, areas on the medial aspect of the left calf and the lateral aspect of the calf at all resolved. He has a new blister on the left dorsal foot at the level of the fourth toe this was excised. No evidence of infection 05/27/16; patient continues to complain weeping edema. He has new blisterlike wounds on the left anterior lateral and posterior lateral calf at the top of his wrap levels. The area on his left anterior foot appears better. He is not complaining of fever, pain or pruritus in his feet. 05/30/16; the patient's blisters on his left anterior leg posterior calf all look improved. He did not increase the Lasix 100 mg as I suggested because he was going to run out of his 40 mg tablets. He is still having weeping edema of his toes 06/03/16; I renewed his Lasix at 80 mg once a day as he was about to run out when I last saw him. He is on 80 mg of Lasix now. I have asked him to cut down on the excessive amount of water he was drinking and asked him to drink according to his thirst mechanisms 06/12/2016 -- was seen 2 days ago and was supposed to wear his compression stockings at home but he is developed lymphedema and superficial blisters on the left lower extremity and hence came in for a review 06/24/16; the  remaining wound is on his left anterior leg. He still has edema coming from between his toes. There is lymphedema here however his edema is generally better than when I last saw this. He has a history of atrial fibrillation but does not have a known history of congestive heart failure nevertheless I think he probably has this at least on a diastolic basis. 07/01/16 I reviewed his echocardiogram from January 2017. This was essentially normal. He did not have LVH, EF of 55-60%. His right ventricular function  was normal although he did have trivial tricuspid and pulmonic regurgitation. This is not audible on exam however. I increased his Lasix to do massive edema in his legs well above his knees I think in early July. He was also drinking an excessive amount of water at the time. 07/15/16; missed his appointment last week because of the Labor Day holiday on Monday. He could not get another appointment later in the week. Started to feel the wrap digging in superiorly so we remove the top half and the bottom half of his wrap. He has extensive erythema and blistering superiorly in the left leg. Very tender. Very swollen. Edema in his foot with leaking edema fluid. He has not been systemically unwell 07/22/16; the area on the left leg laterally required some debridement. The medial wounds look more stable. His wrap injury wounds appear to have healed. Edema and his foot is better, weeping edema is also better. He tells me he is meeting with the supplier of the external compression pumps at work 08/05/16; the patient was on vacation last week in [facility]. His wrap is been on for an extended period of time. Also over the weekend he developed an extensive area of tender erythema across his anterior medial thigh. He took to doxycycline yesterday that he had leftover from a previous prescription. The patient complains of weeping edema coming out of his toes 08/08/16; I saw this patient on 10/2. He was tender across his anterior thigh. I put him on doxycycline. He returns today in follow-up. He does not have any open wounds on his lower leg, he still has edema weeping into his toes. 08/12/16; patient was seen back urgently today to follow-up for his extensive left thigh cellulitis/erysipelas. He comes back with a lot less swelling and erythema pain is much better. I believe I gave him Augmentin and Cipro. His wrap was cut down as he stated a roll down his legs. He developed blistering above the level of the wrap that  remained. He has 2 open blisters and 1 intact. 08/19/16; patient is been doing his primary doctor who is increased his Lasix from 40-80 once a day or 80 already has less edema. Cellulitis has remained improved in the left thigh. 2 open areas on the posterior left calf 08/26/16; he returns today having new open blisters on the anterior part of his left leg. He has his compression pumps but is not yet been shown how to use some vital representative from the supplier. 09/02/16 patient returns today with no open wounds on the left leg. Some maceration in his plantar toes 09/10/2016 -- Dr. Leanord Hawking had recently discharged him on 09/02/2016 and he has come right back with redness swelling and some open ulcers on his left lower extremity. He says this was caused by trying to apply his compression stockings and he's been unable to use this and has not been able to use his lymphedema pumps. He had some doxycycline leftover and he has  started on this a few days ago. 09/16/16; there are no open wounds on his leg on the left and no evidence of cellulitis. 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 He does not complain of symptoms here. I am not clear use using his external compression pumps. 09/23/16; I have not seen this patient in 2 weeks. He canceled his appointment 10 days ago as he was going on vacation. He tells me that on Monday he noticed a large area on his posterior left leg which is been draining copiously and is reopened into a large wound. He is been using ABDs and the external part of his juxtalite, according to our nurse this was not on properly. 10/07/16; Still a substantial area on the posterior left leg. Using silver alginate 10/14/16; in general better although there is still open area which looks healthy. Still using silver alginate. He reminds me that this happen before he left for [facility]. T oday while he was showering in the  morning. He had been using his juxtalite's 10/21/16; the area on his posterior left leg is fully epithelialized. However he arrives today with a large area of tender erythema in his medial and posterior left thigh just above the knee. I have marked the area. Once again he is reluctant to consider hospitalization. I treated him with oral antibiotics in the past for a similar situation with resolution I think with doxycycline however this area it seems more extensive to me. He is not complaining of fever but does have chills and says states he is thirsty. His blood sugar today was in the 140s at home 10/25/16 the area on his posterior left leg is fully epithelialized although there is still some weeping edema. The large area of tenderness and erythema in his medial and posterior left thigh is a lot less tender although there is still a lot of swelling in this thigh. He states he feels a lot better. He is on doxycycline and Augmentin that I started last week. This will continued until Tuesday, December 26. I have ordered a duplex ultrasound of the left thigh rule out DVT whether there is an abscess something that would need to be drained I would also like to know. 11/01/16; he still has weeping edema from a not fully epithelialized area on his left posterior calf. Most of the rest of this looks a lot better. He has completed his antibiotics. His thigh is a lot better. Duplex ultrasound did not show a DVT in the thigh 11/08/16; he comes in today with more Denuded surface epithelium from the posterior aspect of his calf. There is no real evidence of cellulitis. The superior aspect of his wrap appears to have put quite an indentation in his leg just below the knee and this may have contributed. He does not complain of pain or fever. We have been using silver alginate as the primary dressing. The area of cellulitis in the right thigh has totally resolved. He has been using his compression stockings once a  week 11/15/16; the patient arrives today with more loss of epithelium from the posterior aspect of his left calf. He now has a fairly substantial wound in this area. The reason behind this deterioration isn't exactly clear although his edema is not well controlled. He states he feels he is generally more swollen systemically. He is not complaining of chest pain shortness of breath fever. T me he has an appointment with his  primary physician in early February. He is on 80 mg of oral ells Lasix a day. He claims compliance with the external compression pumps. He is not having any pain in his legs similar to what he has with his recurrent cellulitis 11/22/16; the patient arrives a follow-up of his large area on his left lateral calf. This looks somewhat better today. He came in earlier in the week for a dressing change since I saw him a week ago. He is not complaining of any pain no shortness of breath no chest pain 11/28/16; the patient arrives for follow-up of his large area on the left lateral calf this does not look better. In fact it is larger weeping edema. The surface of the wound does not look too bad. We have been using silver alginate although I'm not certain that this is a dressing issue. 12/05/16; again the patient follows up for a large wound on the left lateral and left posterior calf this does not look better. There continues to be weeping edema necrotic surface tissue. More worrisome than this once again there is erythema below the wound involving the distal Achilles and heel suggestive of cellulitis. He is on his feet working most of the day of this is not going well. We are changing his dressing twice a week to facilitate the drainage. 12/12/16; not much change in the overall dimensions of the large area on the left posterior calf. This is very inflamed looking. I gave him an. Doxycycline last week does not really seem to have helped. He found the wrap very painful indeed it seems to of dog into  his legs superiorly and perhaps around the heel. He came in early today because the drainage had soaked through his dressings. 12/19/16- patient arrives for follow-up evaluation of his left lower extremity ulcers. He states that he is using his lymphedema pumps once daily when there is "no drainage". He admits to not using his lipedema pumps while under current treatment. His blood sugars have been consistently between 150-200. 12/26/16; the patient is not using his compression pumps at home because of the wetness on his feet. I've advised him that I think it's important for him to use this daily. He finds his feet too wet, he can put a plastic bag over his legs while he is in the pumps. Otherwise I think will be in a vicious circle. We are using silver alginate to the major area on his left posterior calf 01/02/17; the patient's posterior left leg has further of all into 3 open wounds. All of them covered with a necrotic surface. He claims to be using his compression pumps once a day. His edema control is marginal. Continue with silver alginate 01/10/17; the patient's left posterior leg actually looks somewhat better. There is less edema, less erythema. Still has 3 open areas covered with a necrotic surface requiring debridement. He claims to be using his compression pumps once a day his edema control is better 01/17/17; the patient's left posterior calf look better last week when I saw him and his wrap was changed 2 days ago. He has noted increasing pain in the left heel and arrives today with much larger wounds extensive erythema extending down into the entire heel area especially tender medially. He is not systemically unwell CBGs have been controlled no fever. Our intake nurse showed me limegreen drainage on his AVD pads. 01/24/17; his usual this patient responds nicely to antibiotics last week giving him Levaquin for presumed Pseudomonas. The whole  entire posterior part of his leg is much better much  less inflamed and in the case of his Achilles heel area much less tender. He has also had some epithelialization posteriorly there are still open areas here and still draining but overall considerably better 01/31/17- He has continue to tolerate the compression wraps. he states that he continues to use the lymphedema pumps daily, and can increase to twice daily on the weekends. He is voicing no complaints or concerns regarding his LLE ulcers 02/07/17-he is here for follow-up evaluation. He states that he noted some erythema to the left medial and anterior thigh, which he states is new as of yesterday. He is concerned about recurrent cellulitis. He states his blood sugars have been slightly elevated, this morning in the 180s 02/14/17; he is here for follow-up evaluation. When he was last here there was erythema superiorly from his posterior wound in his anterior thigh. He was prescribed Levaquin however a culture of the wound surface grew MRSA over the phone I changed him to doxycycline on Monday and things seem to be a lot better. 02/24/17; patient missed his appointment on Friday therefore we changed his nurse visit into a physician visit today. Still using silver alginate on the large area of the posterior left thigh. He isn't new area on the dorsal left second toe 03/03/17; actually better today although he admits he has not used his external compression pumps in the last 2 days or so because of work responsibilities over the weekend. 03/10/17; continued improvement. External compression pumps once a day almost all of his wounds have closed on the posterior left calf. Better edema control 03/17/17; in general improved. He still has 3 small open areas on the lateral aspect of his left leg however most of the area on the posterior part of his leg is epithelialized. He has better edema control. He has an ABD pad under his stocking on the right anterior lower leg although he did not let us look at that  today. 03/24/17; patient arrives back in clinic today with no open areas however there are areas on the posterior left calf and anterior left calf that are less than 100% epithelialized. His edema is well controlled in the left lower leg. There is some pitting edema probably lymphedema in the left upper thigh. He uses compression pumps at home once per day. I tried to get him to do this twice a day although he is very reticent. 04/01/2017 -- for the last 2 days he's had significant redness, tenderness and weeping and came in for an urgent visit today. 04/07/17; patient still has 6 more days of doxycycline. He was seen by Dr. Meyer Russel last Wednesday for cellulitis involving the posterior aspect, lateral aspect of his Involving his heel. For the most part he is better there is less erythema and less weeping. He has been on his feet for 12 hours o2 over the weekend. Using his compression pumps once a day 04/14/17 arrives today with continued improvement. Only one area on the posterior left calf that is not fully epithelialized. He has intense bilateral venous inflammation associated with his chronic venous insufficiency disease and secondary lymphedema. We have been using silver alginate to the left posterior calf wound In passing he tells [place] today that the right leg but we have not seen in quite some time has an open area on it but he doesn't want [place] to look at this today states he will show this to [place] next week. 04/21/17;  there is no open area on his left leg although he still reports some weeping edema. He showed [place] his right leg today which is the first time we've seen this leg in a long time. He has a large area of open wound on the right leg anteriorly healthy granulation. Quite a bit of swelling in the right leg and some degree of venous inflammation. He told us about the right leg in passing last week but states that deterioration in the right leg really only happened over the weekend 04/28/17; there  is no open area on the left leg although there is an irritated part on the posterior which is like a wrap injury. The wound on the right leg which was new from last week at least to [place] is a lot better. 05/05/17; still no open area on the left leg. Patient is using his new compression stocking which seems to be doing a good job of controlling the edema. He states he is using his compression pumps once per day. The right leg still has an open wound although it is better in terms of surface area. Required debridement. A lot of pain in the posterior right Achilles marked tenderness. Usually this type of presentation this patient gives concern for an active cellulitis 05/12/17; patient arrives today with his major wound from last week on the right lateral leg somewhat better. Still requiring debridement. He was using his compression stocking on the left leg however that is reopened with superficial wounds anteriorly he did not have an open wound on this leg previously. He is still using his juxta light's once daily at night. He cannot find the time to do this in the morning as he has to be at work by 7 AM 05/19/17; right lateral leg wound looks improved. No debridement required. The concerning area is on the left posterior leg which appears to almost have a subcutaneous hemorrhagic component to it. We've been using silver alginate to all the wounds 05/26/17; the right lateral leg wound continues to look improved. However the area on the left posterior calf is a tightly adherent surface. [place] using silver alginate. Because of the weeping edema in his legs there is very little good alternatives. 06/02/17; the patient left here last week looking quite good. Major wound on the left posterior calf and a small one on the right lateral calf. Both of these look satisfactory. He tells me that by Wednesday he had noted increased pain in the left leg and drainage. He called on Thursday and Friday to get an  appointment here but we were blocked. He did not go to urgent care or his primary physician. He thinks he had a fever on Thursday but did not actually take his temperature. He has not been using his compression pumps on the left leg because of pain. I advised him to go to the emergency room today for IV antibiotics for stents of left leg cellulitis but he has refused I have asked him to take 2 days off work to keep his leg elevated and he has refused this as well. In view of this I'm going to call him and Augmentin and doxycycline. 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 However after a detailed discussion regarding  the risk benefits and alternatives he refuses to get admitted to the hospital. With no other recourse I will continue him on oral antibiotics as before and hopefully he'll have his infectious disease consultation this week. 06/16/2017 -- the patient was seen today by the nurse practitioner at infectious disease Ms. Dixon. 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 He has also been advise Lotrimin cream locally. she also had wise IV antibiotics for his condition if it flares up 06/23/17; patient arrives today with drainage bilaterally although the remaining wound on the left posterior calf after cleaning up today "highlighter yellow drainage" did not look too bad. Unfortunately he has had breakdown on the right anterior leg [previously this leg had not been open and he is using a black stocking] he went to see infectious disease and is been put on clindamycin 150 mg  daily, I did not verify the dose although I'm not familiar with using clindamycin in this dosing range, perhaps for prophylaxisoo 06/27/17; I brought this patient back today to follow-up on the wound deterioration on the right lower leg together with surrounding cellulitis. I started him on doxycycline 4 days ago. This area looks better however he comes in today with intense cellulitis on the medial part of his left thigh. This is not have a wound in this area. Extremely tender. We've been using silver alginate to the wounds on the right lower leg left lower leg with bilateral 4 layer compression he is using his external compression pumps once a day 07/04/17; patient's left medial thigh cellulitis looks better. He has not been using his compression pumps as his insert said it was contraindicated with cellulitis. His right leg continues to make improvements all the wounds are still open. We only have one remaining wound on the left posterior calf. Using silver alginate to all open areas. He is on doxycycline which I started a week ago and should be finishing I gave him Augmentin after Thursday's visit for the severe cellulitis on the left medial thigh which fortunately looks better 07/14/17; the patient's left medial thigh cellulitis has resolved. The cellulitis in his right lower calf on the right also looks better. All of his wounds are stable to improved we've been using silver alginate he has completed the antibiotics I have given him. He has clindamycin 150 mg once a day prescribed by infectious disease for prophylaxis, I've advised him to start this now. We have been using bilateral Unna boots over silver alginate to the wound areas 07/21/17; the patient is been to see infectious disease who noted his recurrent problems with cellulitis. He was not able to tolerate prophylactic clindamycin therefore he is on amoxicillin 500 twice a day. He also had a second daily dose of Lasix added By Dr. Oneta Rack but  he is not taking this. Nor is he being completely compliant with his compression pumps a especially not this week. He has 2 remaining wounds one on the right posterior lateral lower leg and one on the left posterior medial lower leg. 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 The patient has Unna boots bilaterally. Still wounds on his right lateral, left medial, and a new open area on the left anterior lateral lower leg 08/04/17; he remains on  amoxicillin twice a day for prophylaxis of recurrent cellulitis. He has bilateral Unna boots for compression and silver alginate to his wounds. Arrives today with his legs looking as good as I have seen him in quite some time. Not surprisingly his wounds look better as well with improvement on the right lateral leg venous insufficiency wound and also the left medial leg. He is still using the compression pumps once a day 08/11/17; both legs appear to be doing better wounds on the right lateral and left medial legs look better. Skin on the right leg quite good. He is been using silver alginate as the primary dressing. I'm going to use Anasept gel calcium alginate and maintain all the secondary dressings 08/18/17; the patient continues to actually do quite well. The area on his right lateral leg is just about closed the left medial also looks better although it is still moist in this area. His edema is well controlled we have been using Anasept gel with calcium alginate and the usual secondary dressings, 4 layer compression and once daily use of his compression pumps "always been able to manage 09/01/17; the patient continues to do reasonably well in spite of his trip to [place]. The area on the right lateral leg is epithelialized. Left is much better but still open. He has more edema and more chronic erythema on the left leg [venous inflammation] 09/08/17; he arrives today with no open  wound on the right lateral leg and decently controlled edema. Unfortunately his left leg is not nearly as in his good situation as last week.he apparently had increasing edema starting on Saturday. He edema soaked through into his foot so used a plastic bag to walk around his home. The area on the medial right leg which was his open area is about the same however he has lost surface epithelium on the left lateral which is new and he has significant pain in the Achilles area of the left foot. He is already on amoxicillin chronically for prophylaxis of cellulitis in the left leg 09/15/17; he is completed a week of doxycycline and the cellulitis in the left posterior leg and Achilles area is as usual improved. He still has a lot of edema and fluid soaking through his dressings. There is no open wound on the right leg. He saw infectious disease NP today 09/22/17;As usual 1 we transition him from our compression wraps to his stockings things did not go well. He has several small open areas on the right leg. He states this was caused by the compression wrap on his skin although he did not wear this with the stockings over them. He has several superficial areas on the left leg medially laterally posteriorly. He does not have any evidence of active cellulitis especially involving the left Achilles The patient is traveling from [facility] Saturday going to [facility]. He states he isn't attempting to get an appointment with a heel objects wound center there to change his dressings. I am not completely certain whether this will work 10/06/17; the patient came in on Friday for a nurse visit and the nurse reported that his legs actually look quite good. He arrives in clinic today for his regular follow-up visit. He has a new wound on his left third toe over the PIP probably caused by friction with his footwear. He has small areas on the left leg and a very superficial but epithelialized area on the right anterior  lateral lower leg. Other than that his legs look  as good as I've seen him in quite some time. We have been using silver alginate Review of systems; no chest pain no shortness of breath other than this a 10 point review of systems negative 10/20/17; seen by Dr. Meyer Russel last week. He had taken some antibiotics [doxycycline] that he had left over. Dr. Meyer Russel thought he had candida infection and declined to give him further antibiotics. He has a small wound remaining on the right lateral leg several areas on the left leg including a larger area on the left posterior several left medial and anterior and a small wound on the left lateral. The area on the left dorsal third toe looks a lot better. ROS; Gen.; no fever, respiratory no cough no sputum Cardiac no chest pain other than this 10 point review of system is negative 10/30/17; patient arrives today having fallen in the bathtub 3 days ago. It took him a while to get up. He has pain and maceration in the wounds on his left leg which have deteriorated. He has not been using his pumps he also has some maceration on the right lateral leg. 11/03/17; patient continues to have weeping edema especially in the left leg. This saturates his dressings which were just put on on 12/27. As usual the doxycycline seems to take care of the cellulitis on his lower leg. He is not complaining of fever, chills, or other systemic symptoms. He states his leg feels a lot better on the doxycycline I gave him empirically. He also apparently gets injections at his primary doctor's officeo Rocephin for cellulitis prophylaxis. I didn't ask him about his compression pump compliance today I think that's probably marginal. Arrives in the clinic with all of his dressings primary and secondary macerated full of fluid and he has bilateral edema 11/10/17; the patient's right leg looks some better although there is still a cluster of wounds on the right lateral. The left leg is inflamed with  almost circumferential skin loss medially to laterally although we are still maintaining anteriorly. He does not have overt cellulitis there is a lot of drainage. He is not using compression pumps. We have been using silver alginate to the wound areas, there are not a lot of options here 11/17/17; the patient's right leg continues to be stable although there is still open wounds, better than last week. The inflammation in the left leg is better. Still loss of surface layer epithelium especially posteriorly. There is no overt cellulitis in the amount of edema and his left leg is really quite good, tells me he is using his compression pumps once a day. 11/24/17; patient's right leg has a small superficial wound laterally this continues to improve. The inflammation in the left leg is still improving however we have continuous surface layer epithelial loss posteriorly. There is no overt cellulitis in the amount of edema in both legs is really quite good. He states he is using his compression pumps on the left leg once a day for 5 out of 7 days 12/01/17; very small superficial areas on the right lateral leg continue to improve. Edema control in both legs is better today. He has continued loss of surface epithelialization and left posterior calf although I think this is better. We have been using silver alginate with large number of absorptive secondary dressings 4 layer on the left Unna boot on the right at his request. He tells me he is using his compression pumps once a day 12/08/17; he has no open area on  the right leg is edema control is good here. ooOn the left leg however he has marked erythema and tenderness breakdown of skin. He has what appears to be a wrap injury just distal to the popliteal fossa. This is the pattern of his recurrent cellulitis area and he apparently received penicillin at his primary physician's office really worked in my view but usually response to doxycycline given it to him  several times in the past 12/15/17; the patient had already deteriorated last Friday when he came in for his nurse check. There was swelling erythema and breakdown in the right leg. He has much worse skin breakdown in the left leg as well multiple open areas medially and posteriorly as well as laterally. He tells me he has been using his compression pumps but tells me he feels that the drainage out of his leg is worse when he uses a compression pumps. T be fair to him he is been saying this o for a while however I don't know that I have really been listening to this. I wonder if the compression pumps are working properly 12/22/17;. Once again he arrives with severe erythema, weeping edema from the left greater than right leg. Noncompliance with compression pumps. New this visit he is complaining of pain on the lateral aspect of the right leg and the medial aspect of his right thigh. He apparently saw his cardiologist Dr. [name] who was ordered an echocardiogram area and I think this is a step in the right direction 12/25/17; started his doxycycline Monday night. There is still intense erythema of the right leg especially in the anterior thigh although there is less tenderness. The erythema around the wound on the right lateral calf also is less tender. He still complaining of pain in the left heel. His wounds are about the same right lateral left medial left lateral. Superficial but certainly not close to closure. He denies being systemically unwell no fever chills no abdominal pain no diarrhea 12/29/17; back in follow-up of his extensive right calf and right thigh cellulitis. I added amoxicillin to cover possible doxycycline resistant strep. This seems to of done the trick he is in much less pain there is much less erythema and swelling. He has his echocardiogram at 11:00 this morning. X-ray of the left heel was also negative. 01/05/18; the patient arrived with his edema under much better control. Now that  he is retired he is able to use his compression pumps daily and sometimes twice a day per the patient. He has a wound on the right leg the lateral wound looks better. Area on the left leg also looks a lot better. He has no evidence of cellulitis in his bilateral thighs I had a quick peak at his echocardiogram. He is in normal ejection fraction and normal left ventricular function. He has moderate pulmonary hypertension moderately reduced right ventricular function. One would have to wonder about chronic sleep apnea although he says he doesn't snore. He'll review the echocardiogram with his cardiologist. 01/12/18; the patient arrives with the edema in both legs under exemplary control. He is using his compression pumps daily and sometimes twice daily. His wound on the right lateral leg is just about closed. He still has some weeping areas on the posterior left calf and lateral left calf although everything is just about closed here as well. I have spoken with Aldean Baker who is the patient's nurse practitioner and infectious disease. She was concerned that the patient had not understood that the  parenteral penicillin injections he was receiving for cellulitis prophylaxis was actually benefiting him. I don't think the patient actually saw that I would tend to agree we were certainly dealing with less infections although he had a serious one last month. 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 He is healed. He'll be placed in juxtalite compression wraps and increase his lymphedema pumps to twice daily. We will follow up again next week to ensure there are no issues with the new regiment. 01/20/18-he is here for evaluation of bilateral lower extremity weeping edema. Yesterday he was placed in compression wrap to the right lower extremity and compression stocking to left lower shrubbery. He states he uses lymphedema pumps last night and again this morning and noted a  blister to the left lower extremity. On exam he was noted to have drainage to the right lower extremity. He will be placed in Unna boots bilaterally and follow-up next week 01/26/18; patient was actually discharged a week ago to his own juxta light stockings only to return the next day with bilateral lower extremity weeping edema.he was placed in bilateral Unna boots. He arrives today with pain in the back of his left leg. There is no open area on the right leg however there is a linear/wrap injury on the left leg and weeping edema on the left leg posteriorly. I spoke with infectious disease about 10 days ago. They were disappointed that the patient elected to discontinue prophylactic intramuscular penicillin shots as they felt it was particularly beneficial in reducing the frequency of his cellulitis. I discussed this with the patient today. He does not share this view. He'll definitely need antibiotics today. Finally he is traveling to [place] and trauma leaving this Saturday and returning a week later and he does not travel with his pumps. He is going by car 01/30/18; patient was seen 4 days ago and brought back in today for review of cellulitis in the left leg posteriorly. I put him on amoxicillin this really hasn't helped as much as I might like. He is also worried because he is traveling to [facility] trauma by car. Finally we will be rewrapping him. There is no open area on the right leg over his left leg has multiple weeping areas as usual 02/09/18; The same wrap on for 10 days. He did not pick up the last doxycycline I prescribed for him. He apparently took 4 days worth he already had. There is nothing open on his right leg and the edema control is really quite good. He's had damage in the left leg medially and laterally especially probably related to the prolonged use of Unna boots 02/12/18; the patient arrived in clinic today for a nurse visit/wrap change. He complained of a lot of pain in the  left posterior calf. He is taking doxycycline that I previously prescribed for him. Unfortunately even though he used his stockings and apparently used to compression pumps twice a day he has weeping edema coming out of the lateral part of his right leg. This is coming from the lower anterior lateral skin area. 02/16/18; the patient has finished his doxycycline and will finish the amoxicillin 2 days. The area of cellulitis in the left calf posteriorly has resolved. He is no longer having any pain. He tells me he is using his compression pumps at least once a day sometimes twice. 02/23/18; the patient finished his doxycycline and Amoxil last week. On Friday he noticed a small erythematous circle about  the size of a quarter on the left lower leg just above his ankle. This rapidly expanded and he now has erythema on the lateral and posterior part of the thigh. This is bright red. Also has an area on the dorsal foot just above his toes and a tender area just below the left popliteal fossa. He came off his prophylactic penicillin injections at his own insistence one or 2 months ago. This is obviously deteriorated since then 03/02/18; patient is on doxycycline and Amoxil. Culture I did last week of the weeping area on the back of his left calf grew group B strep. I have therefore renewed the amoxicillin 500 3 times a day for a further week. He has not been systemically unwell. Still complaining of an area of discomfort right under his left popliteal fossa. There is no open wound on the right leg. He tells me that he is using his pumps twice a day on most days 03/09/18; patient arrives in clinic today completing his amoxicillin today. The cellulitis on his left leg is better. Furthermore he tells me that he had intramuscular penicillin shots that his primary care office today. However he also states that the wrap on his right leg fell down shortly after leaving clinic last week. He developed a large blister that  was present when he came in for a nurse visit later in the week and then he developed intense discomfort around this area.He tells me he is using his compression pumps 03/16/18; the patient has completed his doxycycline. The infectious part of this/cellulitis in the left heel area left popliteal area is a lot better. He has 2 open areas on the right calf. Still areas on the left calf but this is a lot better as well. 03/24/18; the patient arrives complaining of pain in the left popliteal area again. He thinks some of this is wrap injury. He has no open area on the right leg and really no open area on the left calf either except for the popliteal area. He claims to be compliant with the compression pumps 03/31/18; I gave him doxycycline last week because of cellulitis in the left popliteal area. This is a lot better although the surface epithelium is denuded off and response to this. He arrives today with uncontrolled edema in the right calf area as well as a fingernail injury in the right lateral calf. There is only a few open areas on the left 04/06/18; I gave him amoxicillin doxycycline over the last 2 weeks that the amoxicillin should be completing currently. He is not complaining of any pain or systemic symptoms. The only open areas see has is on the right lateral lower leg paradoxically I cannot see anything on the left lower leg. He tells me he is using his compression pumps twice a day on most days. Silver alginate to the wounds that are open under 4 layer compression 04/13/18; he completed antibiotics and has no new complaints. Using his compression pumps. Silver alginate that anything that's opened 04/20/18; he is using his compression pumps religiously. Silver alginate 4 layer compression anything that's opened. He comes in today with no open wounds on the left leg but 3 on the right including a new one posteriorly. He has 2 on the right lateral and one on the right posterior. He likes Unna boots on  the right leg for reasons that aren't really clear we had the usual 4 layer compression on the left. It may be necessary to move to the  4 layer compression on the right however for now I left them in the Unna boots 04/27/18; he is using his compression pumps at least once a day. He has still the wounds on the right lateral calf. The area right posteriorly has closed. He does not have an open wound on the left under 4 layer compression however on the dorsal left foot just proximal to the toes and the left third toe 2 small open areas were identified 05/11/18; he has not uses compression pumps. The areas on the right lateral calf have coalesced into one large wound necrotic surface. On the left side he has one small wound anteriorly however the edema is now weeping out of a large part of his left leg. He says he wasn't using his pumps because of the weeping fluid. I explained to him that this is the time he needs to pump more 05/18/18; patient states he is using his compression pumps twice a day. The area on the right lateral large wound albeit superficial. On the left side he has innumerable number of small new wounds on the left calf particularly laterally but several anteriorly and medially. All these appear to have healthy granulated base these look like the remnants of blisters however they occurred under compression. The patient arrives in clinic today with his legs somewhat better. There is certainly less edema, less multiple open areas on the left calf and the right anterior leg looks somewhat better as well superficial and a little smaller. However he relates pain and erythema over the last 3-4 days in the thigh and I looked at this today. He has not been systemically unwell no fever no chills no change in blood sugar values 05/25/18; comes in today in a better state. The severe cellulitis on his left leg seems better with the Keflex. Not as tender. He has not been systemically unwell ooHard to  find an open wound on the left lower leg using his compression pumps twice a day ooThe confluent wounds on his right lateral calf somewhat better looking. These will ultimately need debridement I didn't do this today. 06/01/18; the severe cellulitis on the left anterior thigh has resolved and he is completed his Keflex. ooThere is no open wound on the left leg however there is a superficial excoriation at the base of the third toe dorsally. Skin on the bottom of his left foot is macerated looking. ooThe left the wounds on the lateral right leg actually looks some better although he did require debridement of the top half of this wound area with an open curet 06/09/18 on evaluation today patient appears to be doing poorly in regard to his right lower extremity in particular this appears to likely be infected he has very thick purulent discharge along with a bright green tent to the discharge. This makes me concerned about the possibility of pseudomonas. He's also having increased discomfort at this point on evaluation. Fortunately there does not appear to be any evidence of infection spreading to the other location at this time. 06/16/18 on evaluation today patient appears to actually be doing fairly well. His ulcer has actually diminished in size quite significantly at this point which is good news. Nonetheless he still does have some evidence of infection he did see infectious disease this morning before coming here for his appointment. I did review the results of their evaluation and their note today. They did actually have him discontinue the Cipro and initiate treatment with linezolid at this time. He  is doing this for the next seven days and they recommended a follow-up in four months with them. He is the keep a log of the need for intermittent antibiotic therapy between now and when he falls back up with infectious disease. This will help them gaze what exactly they need to do to try and help them  out. 06/23/18; the patient arrives today with no open wounds on the left leg and left third toe healed. He is been using his compression pumps twice a day. On the right lateral leg he still has a sizable wound but this is a lot better than last time I saw this. 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 Has been using silver alginate under 4 layer compression 06/30/18; the only open wound he has is on the right lateral leg and this looks healthy. No debridement is required. We have been using silver alginate. He does not have an open wound on the left leg. There is apparently some drainage from the dorsal proximal third toe on the left although I see no open wound here. 07/03/18 on evaluation today patient was actually here just for a nurse visit rapid change. However when he was here on Wednesday for his rat change due to having been healed on the left and then developing blisters we initiated the wrap again knowing that he would be back today for [place] to reevaluate and see were at. Unfortunately he has developed some cellulitis into the proximal portion of his right lower extremity even into the region of his thigh. 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 He was placed on one as what at that point. Nonetheless he is done with that and has been tolerating it well otherwise. Doxycycline which in the past really did not seem to be effective for him. Nonetheless I think the best option may be for [place] to definitely reinitiate the antibiotics for a longer period of time. 07/07/18; since I last saw this patient a week ago he has had a difficult time. At that point he did not have an open wound on his left leg. We transitioned him into juxta light stockings. He was apparently in the clinic the next day with blisters on the left lateral and left medial lower calf. He  also had weeping edema fluid. He was put back into a compression wrap. He was also in the clinic on Friday with intense erythema in his right thigh. Per the patient he was started on Bactrim however that didn't work at all in terms of relieving his pain and swelling. He has taken 3 doxycycline that he had left over from last time and that seems to of helped. He has blistering on the right thigh as well. 07/14/18; the erythema on his right thigh has gotten better with doxycycline that he is finishing. 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 Presumably this cellulitis in the thigh was not related to that although I think this is a bit concerning going forward. He still has an area on the right lateral calf the blister on the right medial calf just below the knee that was discussed above. On the left 2 small open areas left medial and left lateral. Edema control is adequate. He is using his compression  pumps twice a day 07/20/18; continued improvement in the condition of both legs especially the edema in his bilateral thighs. He tells me he is been losing weight through a combination of diet and exercise. He is using his compression pumps twice a day. So overall she made to the remaining wounds 07/27/2018; continued improvement in condition of both legs. His edema is well controlled. The area on the right lateral leg is just about closed he had one blisters show up on the medial left upper calf. We have him in 4 layer compression. He is going on a 10-day trip to [place], [place] and [place]. He will be driving. He wants to wear Unna boots because of the lessening amount of constriction. He will not use compression pumps while he is away 08/05/18 on evaluation today patient actually appears to be doing decently well all things considered in regard to his bilateral lower extremities. The worst ulcer is actually only posterior  aspect of his left lower extremity with a four layer compression wrap cut into his leg a couple weeks back. He did have a trip and actually had [employer] for the trip that he is worn since he was last here. Nonetheless he feels like the [employer] actually do better for him his swelling is up a little bit but he also with his trip was not taking his Lasix on a regular set schedule like he was supposed to be. He states that obviously the reason being that he cannot drive and keep going without having to urinate too frequently which makes it difficult. He did not have his pumps with him while he was away either which I think also maybe playing a role here too. 08/13/2018; the patient only has a small open wound on the right lateral calf which is a big improvement in the last month or 2. He also has the area posteriorly just below the posterior fossa on the left which I think was a wrap injury from several weeks ago. He has no current evidence of cellulitis. He tells me he is back into his compression pumps twice a day. He also tells me that while he was at the laundromat somebody stole a section of his extremitease stockings 08/20/2018; back in the clinic with a much improved state. He only has small areas on the right lateral mid calf which is just about healed. This was is more substantial area for quite a prolonged period of time. He has a small open area on the left anterior tibia. The area on the posterior calf just below the popliteal fossa is closed today. He is using his compression pumps twice a day 08/28/2018; patient has no open wound on the right leg. He has a smattering of open areas on the calf with some weeping lymphedema. More problematically than that it looks as though his wraps of slipped down in his usual he has very angry upper area of edema just below the right medial knee and on the right lateral calf. He has no open area on his feet. The patient is traveling to [facility] next week. I will send him in an antibiotic. We will continue to wrap the right leg. We ordered extremitease stockings for him last week and I plan to transition the right leg to a stocking when he gets home which will be in 10 days time. As usual he is very reluctant to take his pumps with him when he travels 09/07/2018; patient  returns from [facility]. He shows me a picture of his left leg in the mid part of his trip last week with intense fire engine erythema. The picture look bad enough I would have considered sending him to the hospital. Instead he went to the wound care center in [facility]. They did not prescribe him antibiotics but he did take some doxycycline he had leftover from a previous visit. I had given him trimethoprim sulfamethoxazole before he left this did not work according to the patient. This is resulted in some improvement fortunately. He comes back with a large wound on the left posterior calf. Smaller area on the left anterior tibia. Denuded blisters on the dorsal left foot over his toes. Does not have much in the way of wounds on the right leg although he does have a very tender area on the right posterior area just below the popliteal fossa also suggestive of infection. He promises me he is back on his pumps twice a day 09/15/2018; the intense cellulitis in his left lower calf is a lot better. The wound area on the posterior left calf is also so better. However he has reasonably extensive wounds on the dorsal aspect of his second and third toes and the proximal foot just at the base of the toes. There is nothing open on the right leg 09/22/2018; the patient has excellent edema control in his legs bilaterally. He is using his external compression pumps twice a day. He has no open area on the right leg and only the areas in the left foot dorsally second and third toe area on the left side. He does not have any signs of active cellulitis. 10/06/2018; the  patient has good edema control bilaterally. He has no open wound on the right leg. There is a blister in the posterior aspect of his left calf that we had to deal with today. He is using his compression pumps twice a day. There is no signs of active cellulitis. We have been using silver alginate to the wound areas. He still has vulnerable areas on the base of his left first second toes dorsally He has a his extremities stockings and we are going to transition him today into the stocking on the right leg. He is cautioned that he will need to continue to use the compression pumps twice a day. If he notices uncontrolled edema in the right leg he may need to go to 3 times a day. 10/13/2018; the patient came in for a nurse check on Friday he has a large flaccid blister on the right medial calf just below the knee. We unroofed this. He has this and a new area underneath the posterior mid calf which was undoubtedly a blister as well. He also has several small areas on the right which is the area we put his extremities stocking on. 10/19/2018; the patient went to see infectious disease this morning I am not sure if that was a routine follow-up in any case the doxycycline I had given him was discontinued and started on linezolid. He has not started this. It is easy to look at his left calf and the inflammation and think this is cellulitis however he is very tender in the tissue just below the popliteal fossa and I have no doubt that there is infection going on here. He states the problem he is having is that with the compression pumps the edema goes down and then starts walking the wrap falls down. We will  see if we can adhere this. He has 1 or 2 minuscule open areas on the right still areas that are weeping on the posterior left calf, the base of his left second and third toes 10/26/18; back today in clinic with quite of skin breakdown in his left anterior leg. This may have been infection the area below the  popliteal fossa seems a lot better however tremendous epithelial loss on the left anterior mid tibia area over quite inexpensive tissue. He has 2 blisters on the right side but no other open wound here. 10/29/2018; came in urgently to see [place] today and we worked him in for review. He states that the 4 layer compression on the right leg caused pain he had to cut it down to roughly his mid calf this caused swelling above the wrap and he has blisters and skin breakdown today. As a result of the pain he has not been using his pumps. Both legs are a lot more edematous and there is a lot of weeping fluid. 11/02/18; arrives in clinic with continued difficulties in the right leg> left. Leg is swollen and painful. multiple skin blisters and new open areas especially laterally. He has not been using his pumps on the right leg. 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 He is not systemically unwell. 11/09/2018; the patient claims he is being compliant with his pumps. He is finished the doxycycline I gave him last week. Culture I did of the wound on the right lateral leg showed a few very resistant methicillin staph aureus. This was resistant to doxycycline. Nevertheless he states the pain in the leg is a lot better which makes me wonder if the cultured organism was not really what was causing the problem nevertheless this is a very dangerous organism to be culturing out of any wound. His right leg is still a lot larger than the left. He is using an Radio broadcast assistant on this area, he blames a 4-layer compression for causing the original skin breakdown which I doubt is true however I cannot talk him out of it. We have been using silver alginate to all of these areas which were initially blisters 11/16/2018; patient is being compliant with his external compression pumps at twice a day. Miraculously he arrives in clinic today with absolutely no open wounds. He has better edema  control on the left where he has been using 4 layer compression versus wound of wounds on the right and I pointed this out to him. There is no inflammation in the skin in his lower legs which is also somewhat unusual for him. There is no open wounds on the dorsal left foot. He has extremitease stockings at home and I have asked him to bring these in next week. 11/25/18 patient's lower extremity on examination today on the left appears for the most part to be wound free. He does have an open wound on the lateral aspect of the right lower extremity but this is minimal compared to what I've seen in past. He does request that we go ahead and wrap the left leg as well even though there's nothing open just so hopefully it will not reopen in short order. 1/28; patient has superficial open wounds on the right lateral calf left anterior calf and left posterior calf. His edema control is adequate. 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 We  have been using silver alginate as the primary dressing. He claims compliance with his compression pumps 2/4; patient has superficial open wounds on numerous areas of his left calf and again one on the left dorsal foot. The areas on the right lateral calf have healed. The cellulitis that I gave him doxycycline for last week is also resolved this was mostly on the left anterior calf just below the tibial tuberosity. His edema looks fairly well-controlled. He tells me he went to see his primary doctor today and had blood work ordered 2/11; once again he has several open areas on the left calf left tibial area. Most of these are small and appear to have healthy granulation. He does not have anything open on the right. The edema and control in his thighs is pretty good which is usually a good indication he has been using his pumps as requested. 2/18; he continues to have several small areas on  the left calf and left tibial area. Most of these are small healthy granulation. We put him in his stocking on the right leg last week and he arrives with a superficial open area over the right upper tibia and a fairly large area on the right lateral tibia in similar condition. His edema control actually does not look too bad, he claims to be using his compression pumps twice a day 2/25. Continued small areas on the left calf and left tibial area. New areas especially on the right are identified just below the tibial tuberosity and on the right upper tibia itself. There are also areas of weeping edema fluid even without an obvious wound. He does not have a considerable degree of lymphedema but clearly there is more edema here than his skin can handle. He states he is using the pumps twice a day. We have an Unna boot on the right and 4 layer compression on the left. 3/3; he continues to have an area on the right lateral calf and right posterior calf just below the popliteal fossa. There is a fair amount of tenderness around the wound on the popliteal fossa but I did not see any evidence of cellulitis, could just be that the wrap came down and rubbed in this area. ooHe does not have an open area on the left leg however there is an area on the left dorsal foot at the base of the third toe ooWe have been using silver alginate to all wound areas 3/10; he did not have an open area on his left leg last time he was here a week ago. T oday he arrives with a horizontal wound just below the tibial tuberosity and an area on the left lateral calf. He has intense erythema and tenderness in this area. The area is on the right lateral calf and right posterior calf better than last week. We have been using silver alginate as usual 3/18 - Patient returns with 3 small open areas on left calf, and 1 small open area on right calf, the skin looks ok with no significant erythema, he continues the UNA boot on right and 4  layer compression on left. The right lateral calf wound is closed , the right posterior is small area. we will continue silver alginate to the areas. 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 He is been  tolerating the dressing changes without complication. Fortunately she has made excellent improvement in regard to the overall status of his wounds. Unfortunately every time we cease wrapping him he ends up reopening in causing more significant issues at that point. Again I'm unsure of the best direction to take although I think the lymphedema clinic may be appropriate for him. 02/03/19 on evaluation today patient appears to be doing well in regard to the wounds that we saw him for last week unfortunately he has a new area on the proximal portion of his right medial/posterior lower extremity where the wrap somewhat slowed down and caused swelling and a blister to rub and open. Unfortunately this is the only opening that he has on either leg at this point. 02/17/19 on evaluation today patient's bilateral lower extremities appear to be doing well. He still completely healed in regard to the left lower extremity. In regard to the right lower extremity the area where the wrap and slid down and caused the blister still seems to be slightly open although this is dramatically better than during the last evaluation two weeks ago. I'm very pleased with the way this stands overall. 03/03/19 on evaluation today patient appears to be doing well in regard to his right lower extremity in general although he did have a new blister open this does not appear to be showing any evidence of active infection at this time. Fortunately there's No fevers, chills, nausea, or vomiting noted at this time. Overall I feel like he is making  good progress it does feel like that the right leg will we perform the [employer] seems to do with a bit better than three layer wrap on the left which slid down on him. We may switch to doing bilateral in the book wraps. 5/4; I have not seen Mr. Lipsett in quite some time. According to our case manager he did not have an open wound on his left leg last week. He had 1 remaining wound on the right posterior medial calf. He arrives today with multiple openings on the left leg probably were blisters and/or wrap injuries from Unna boots. I do not think the Unna boot's will provide adequate compression on the left. I am also not clear about the frequency he is using the compression pumps. 03/17/19 on evaluation today patient appears to be doing excellent in regard to his lower extremities compared to last week's evaluation apparently. He had gotten significantly worse last week which is unfortunate. The [employer] wrap on the left did not seem to do very well for him at all and in fact it didn't control his swelling significantly enough he had an additional outbreak. Subsequently we go back to the four layer compression wrap on the left. This is good news. At least in that he is doing better and the wound seem to be killing him. He still has not heard anything from the lymphedema clinic. 03/24/19 on evaluation today patient actually appears to be doing much better in regard to his bilateral lower Trinity as compared to last week when I saw him. Fortunately there's no signs of active infection at this time. He has been tolerating the dressing changes without complication. Overall I'm extremely pleased with the progress and appearance in general. 04/07/19 on evaluation today patient appears to be doing well in regard to his bilateral lower extremities. His swelling is significantly down from where it was previous. With that being said he does have a couple blisters still open at  this point but fortunately nothing  that seems to be too severe and again the majority of the larger openings has healed at this time. 04/14/19 on evaluation today patient actually appears to be doing quite well in regard to his bilateral lower extremities in fact I'm not even sure there's anything significantly open at this time at any site. Nonetheless he did have some trouble with these wraps where they are somewhat irritating him secondary to the fact that he has noted that the graph wasn't too close down to the end of this foot in a little bit short as well up to his knee. Otherwise things seem to be doing quite well. 04/21/19 upon evaluation today patient's wound bed actually showed evidence of being completely healed in regard to both lower extremities which is excellent news. There does not appear to be any signs of active infection which is also good news. I'm very pleased in this regard. No fevers, chills, nausea, or vomiting noted at this time. 04/28/19 on evaluation today patient appears to be doing a little bit worse in regard to both lower extremities on the left mainly due to the fact that when he went infection disease the wrap was not wrapped quite high enough he developed a blister above this. On the right he is a small open area of nothing too significant but again this is continuing to give him some trouble he has been were in the Velcro compression that he has at home. 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 He's been tolerating the dressing changes without complication. Fortunately there's no signs of active infection at this time. No fevers, chills, nausea, or vomiting noted at this time. We have been trying to get an appointment with her lymphedema clinic in [facility] but unfortunately nobody can get them on phone with not been able to even fax information over the patient likewise is not been able to get in touch with them. Overall  I'm not sure exactly what's going on here with to reach out again today. 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 Still having a lot of drainage unfortunately. He tells me especially in the left but even on the right. There's no signs of active infection which is good news we've been using so ratcheted up to this point. 05/19/19 on evaluation today patient actually appears to be doing quite well with regard to his left lower extremity which is great news. Fortunately in regard to the right lower extremity has an issues with his wrap and he subsequently did remove this from what I'm understanding. Nonetheless long story short is what he had rewrapped once he removed it subsequently had maggots underneath this wrap whenever he came in for evaluation today. With that being said they were obviously completely cleaned away by the nursing staff. The visit today which is excellent news. However he does appear to potentially have some infection around the right ankle region where the maggots were located as well. He will likely require anabiotic therapy today. 05/26/19 on evaluation today patient actually appears to be doing much better in regard to his bilateral lower extremities. I feel like the infection is under much better control. With that being said there were maggots noted when the wrap was removed yet again today. Again this could have potentially been left over from previous although at this  time there does not appear to be any signs of significant drainage there was obviously on the wrap some drainage as well this contracted gnats or otherwise. Either way I do not see anything that appears to be doing worse in my pinion and in fact I think his drainage has slowed down quite significantly likely mainly due to the fact to his infection being under better control. 06/02/2019 on evaluation today patient actually appears  to be doing well with regard to his bilateral lower extremities there is no signs of active infection at this time which is great news. With that being said he does have several open areas more so on the right than the left but nonetheless these are all significantly better than previously noted. 06/09/2019 on evaluation today patient actually appears to be doing well. His wrap stayed up and he did not cause any problems he had more drainage on the right compared to the left but overall I do not see any major issues at this time which is great news. 06/16/2019 on evaluation today patient appears to be doing excellent with regard to his lower extremities the only area that is open is a new blister that can have opened as of today on the medial ankle on the left. Other than this he really seems to be doing great I see no major issues at this point. 06/23/2019 on evaluation today patient appears to be doing quite well with regard to his bilateral lower extremities. In fact he actually appears to be almost completely healed there is a small area of weeping noted of the right lower extremity just above the ankle. Nonetheless fortunately there is no signs of active infection at this time which is good news. No fevers, chills, nausea, vomiting, or diarrhea. 8/24; the patient arrived for a nurse visit today but complained of very significant pain in the left leg and therefore I was asked to look at this. Noted that he did not have an open area on the left leg last week nevertheless this was wrapped. The patient states that he is not been able to put his compression pumps on the left leg because of the discomfort. He has not been systemically unwell 06/30/2019 on evaluation today patient unfortunately despite being excellent last week is doing much worse with regard to his left lower extremity today. In fact he had to come in for a nurse on Monday where his left leg had to be rewrapped due to excessive weeping Dr.  Leanord Hawking placed him on doxycycline at that point. Fortunately there is no signs of active infection Systemically at this time which is good news. 07/07/2019 in regard to the patient's wounds today he actually seems to be doing well with his right lower extremity there really is nothing open or draining at this point this is great news. Unfortunately the left lower extremity is given him additional trouble at this time. There does not appear to be any signs of active infection nonetheless he does have a lot of edema and swelling noted at this point as well as blistering all of which has led to a much more poor appearing leg at this time compared to where it was 2 weeks ago when it was almost completely healed. Obviously this is a little discouraging for the patient. He is try to contact the lymphedema clinic in [place] he has not been able to get through to them. 07/14/2019 on evaluation today patient actually appears to be doing slightly better with regard  to his left lower extremity ulcers. Overall I do feel like at least at the top of the wrap that we have been placing this area has healed quite nicely and looks much better. The remainder of the leg is showing signs of improvement. Unfortunately in the thigh area he still has an open region on the left and again on the right he has been utilizing just a Band-Aid on an area that also opened on the thigh. Again this is an area that were not able to wrap although we did do an Ace wrap to provide some compression that something that obviously is a little less effective than the compression wraps we have been using on the lower portion of the leg. He does have an appointment with the lymphedema clinic in [facility] on Friday. 07/21/2019 on evaluation today patient appears to be doing better with regard to his lower extremity ulcers. He has been tolerating the dressing changes without complication. Fortunately there is no signs of active infection at this  time. No fevers, chills, nausea, vomiting, or diarrhea. I did receive the paperwork from the physical therapist at the lymphedema clinic in [place]. Subsequently I signed off on that this morning and sent that back to him for further progression with the treatment plan. 07/28/2019 on evaluation today patient appears to be doing very well with regard to his right lower extremity where I do not see any open wounds at this point. Fortunately he is feeling great as far as that is concerned as well. In regard to the left lower extremity he has been having issues with still several areas of weeping and edema although the upper leg is doing better his lower leg still I think is going require the compression wrap at this time. No fevers, chills, nausea, vomiting, or diarrhea. 08/04/2019 on evaluation today patient unfortunately is having new wounds on the right lower extremity. Again we have been using Unna boot wrap on that side. We switched him to using his juxta lite wrap at home. With that being said he tells me he has been using it although his legs extremely swollen and to be honest really does not appear that he has been. I cannot know that for sure however. Nonetheless he has multiple new wounds on the right lower extremity at this time. Obviously we will have to see about getting this rewrapped for him today. 08/11/2019 on evaluation today patient appears to be doing fairly well with regard to his wounds. He has been tolerating the dressing changes including the compression wraps without complication. He still has a lot of edema in his upper thigh regions bilaterally he is supposed to be seeing the lymphedema clinic on the 15th of this month once his wraps arrive for the upper part of his legs. 08/18/2019 on evaluation today patient appears to be doing well with regard to his bilateral lower extremities at this point. He has been tolerating the dressing changes without complication. Fortunately  there is no signs of active infection which is also good news. He does have a couple weeping areas on the first and second toe of the right foot he also has just a small area on the left foot upper leg and a small area on the left lower leg but overall he is doing quite well in my opinion. He is supposed to be getting his wraps shortly in fact tomorrow and then subsequently is seeing the lymphedema clinic next Wednesday on the 21st. Of note he is also  leaving on the 25th to go on vacation for a week to the beach. For that reason and since there is some uncertainty about what there can be doing at lymphedema clinic next Wednesday I am get a make an appointment for next Friday here for [place] to see what we need to do for him prior to him leaving for vacation. 10/23; patient arrives in considerable pain predominantly in the upper posterior calf just distal to the popliteal fossa also in the wound anteriorly above the major wound. This is probably cellulitis and he has had this recurrently in the past. He has no open wound on the right side and he has had an Radio broadcast assistant in that area. Finally I note that he has an area on the left posterior calf which by enlarge is mostly epithelialized. This protrudes beyond the borders of the surrounding skin in the setting of dry scaly skin and lymphedema. The patient is leaving for [facility] on Sunday. Per his longstanding pattern, he will not take his compression pumps with him predominantly out of fear that they will be stolen. He therefore asked that we put a Unna boot back on the right leg. He will also contact the wound care center in [facility] to see if they can change his dressing in the mid week. 11/3; patient returned from his vacation to [employer] [facility] He was seen on 1 occasion at their wound care center. They did a 2 layer compression system as they did not have our 4-layer wrap. I am not completely certain what they put on the wounds. They did not change the  Unna boot on the right. The patient is also seeing a lymphedema specialist physical therapist in [place]. It appears that he has some compression sleeve for his thighs which indeed look quite a bit better than I am used to seeing. He pumps over these with his external compression pumps. 11/10; the patient has a new wound on the right medial thigh otherwise there is no open areas on the right. He has an area on the left leg posteriorly anteriorly and medially and an area over the left second toe. We have been using silver alginate. He thinks the injury on his thigh is secondary to friction from the compression sleeve he has. 11/17; the patient has a new wound on the right medial thigh last week. He thinks this is because he did not have a underlying stocking for his thigh juxta lite apparatus. He now has this. The area is fairly large and somewhat angry but I do not think he has underlying cellulitis. ooHe has a intact blister on the right anterior tibial area. ooSmall wound on the right great toe dorsally ooSmall area on the medial left calf. 11/30; the patient does not have any open areas on his right leg and we did not take his juxta lite stocking off. However he states that on Friday his compression wrap fell down lodging around his upper mid calf area. As usual this creates a lot of problems for him. He called urgently today to be seen for a nurse visit however the nurse visit turned into a provider visit because of extreme erythema and pain in the left anterior tibia extending laterally and posteriorly. The area that is problematic is extensive 10/06/2019 upon evaluation today patient actually appears to be doing poorly in regard to his left lower extremity. He Dr. Leanord Hawking did place him on doxycycline this past Monday apparently due to the fact that he  was doing much worse in regard to this left leg. Fortunately the doxycycline does seem to be helping. Unfortunately we are still having a  very difficult time getting his edema under any type of control in order to anticipate discharge at some point. The only way were really able to control his lymphedema really is with compression wraps and that has only even seemingly temporary. He has been seeing a lymphedema clinic they are trying to help in this regard but still this has been somewhat frustrating in general for the patient. 10/13/19 on evaluation today patient appears to be doing excellent with regard to his right lower extremity as far as the wounds are concerned. His swelling is still quite extensive unfortunately. He is still having a lot of drainage from the thigh areas bilaterally which is unfortunate. He's been going to lymphedema clinic but again he still really does not have this edema under control as far as his lower extremities are concern. With regard to his left lower extremity this seems to be improving and I do believe the doxycycline has been of benefit for him. He is about to complete the doxycycline. 10/20/2019 on evaluation today patient appears to be doing poorly in regard to his bilateral lower extremities. More in the right thigh he has a lot of irritation at this site unfortunately. In regard to the left lower extremity the wrap was not quite as high it appears and does seem to have caused him some trouble as well. Fortunately there is no evidence of systemic infection though he does have some blue-green drainage which has me concerned for the possibility of Pseudomonas. He tells me he is previously taking Cipro without complications and he really does not care for Levaquin however due to some of the side effects he has. He is not allergic to any medications specifically antibiotics that were aware of. 10/27/2019 on evaluation today patient actually does appear to be for the most part doing better when compared to last week's evaluation. With that being said he still has multiple open wounds over the bilateral  lower extremities. He actually forgot to start taking the Cipro and states that he still has the whole bottle. He does have several new blisters on left lower extremity today I think I would recommend he go ahead and take the Cipro based on what I am seeing at this point. 12/30-Patient comes at 1 week visit, 4 layer compression wraps on the left and Unna boot on the right, primary dressing Xtrasorb and silver alginate. Patient is taking his Cipro and has a few more days left probably 5-6, and the legs are doing better. He states he is using his compressions devices which I believe he has 11/10/2019 on evaluation today patient actually appears to be much better than last time I saw him 2 weeks ago. His wounds are significantly improved and overall I am very pleased in this regard. Fortunately there is no signs of active infection at this time. He is just a couple of days away from completing Cipro. Overall his edema is much better he has been using his lymphedema pumps which I think is also helping at this point. 11/17/2019 on evaluation today patient appears to be doing excellent in regard to his wounds in general. His legs are swollen but not nearly as much as they have been in the past. Fortunately he is tolerating the compression wraps without complication. No fevers, chills, nausea, vomiting, or diarrhea. He does have some erythema however in  the distal portion of his right lower extremity specifically around the forefoot and toes there is a little bit of warmth here as well. 11/24/2019 on evaluation today patient appears to be doing well with regard to his right lower extremity I really do not see any open wounds at this point. His left lower extremity does have several open areas and his right medial thigh also is open. Other than this however overall the patient seems to be making good progress and I am very pleased at this point. 12/01/2019 on evaluation today patient appears to be doing poorly at  this point in regard to his left lower extremity has several new blisters despite the fact that we have him in compression wraps. In fact he had a 4-layer compression wrap, his upper thigh wrapped from lymphedema clinic, and a juxta light over top of the 4 layer compression wrap the lymphedema clinic applied and despite all this he still develop blisters underneath. Obviously this does have me concerned about the fact that unfortunately despite what we are doing to try to get wounds healed he continues to have new areas arise I do not think he is ever good to be at the point where he can realistically just use wraps at home to keep things under control. Typically when we heal him it takes about 1-2 days before he is back in the clinic with severe breakdown and blistering of his lower extremities bilaterally. This is happened numerous times in the past. Unfortunately I think that we may need some help as far as overall fluid overload to kind of limit what we are seeing and get things under better control. 12/08/2019 on evaluation today patient presents for follow-up concerning his ongoing bilateral lower extremity edema. Unfortunately he is still having quite a bit of swelling the compression wraps are controlling this to some degree but he did see Dr. [name] his cardiologist I do have that available for review today as far as the appointment was concerned that was on 12/06/2019. Obviously that she has been 2 days ago. The patient states that he is only been taking the Lasix 80 mg 1 time a day he had told me previously he was taking this twice a day. Nonetheless Dr. [name] recommended this be up to 80 mg 2 times a day for the patient as he did appear to be fluid overloaded. With that being said the patient states he did this yesterday and he was unable to go anywhere or do anything due to the fact that he was constantly having to urinate. Nonetheless I think that this is still good to be something that is  important for him as far as trying to get his edema under control at all things that he is going to be able to just expect his wounds to get under control and things to be better without going through at least a period of time where he is trying to stabilize his fluid management in general and I think increasing the Lasix is likely the first step here. It was also mentioned the possibility that the patient may require metolazone. With that being said he wanted to have the patient take Lasix twice a day first and then reevaluating 2 months to see where things stand. 12/15/2019 upon evaluation today patient appears to be doing regard to his legs although his toes are showing some signs of weeping especially on the left at this point to some degree on the right. There does not appear  to be any signs of active infection and overall I do feel like the compression wraps are doing well for him but he has not been able to take the Lasix at home and the increased dose that Dr. [name] recommended. He tells me that just not go to be feasible for him. Nonetheless I think in this case he should probably send a message to Dr. [name] in order to discuss options from the standpoint of possible admission to get the fluid off or otherwise going forward. 12/22/2019 upon evaluation today patient appears to be doing fairly well with regard to his lower extremities at this point. In fact he would be doing excellent if it was not for the fact that his right anterior thigh apparently had an allergic reaction to adhesive tape that he used. The wound itself that we have been monitoring actually appears to be healed. There is a lot of irritation at this point. 12/29/2019 upon evaluation today patient appears to be doing well in regard to his lower extremities. His left medial thigh is open and somewhat draining today but this is the only region that is open the right has done much better with the treatment utilizing the steroid cream  that I prescribed for him last week. Overall I am pleased in that regard. Fortunately there is no signs of active infection at this time. No fevers, chills, nausea, vomiting, or diarrhea. 01/05/2020 upon evaluation today patient appears to be doing more poorly in regard to his right lower extremity at this point upon evaluation today. Unfortunately he continues to have issues in this regard and I think the biggest issue is controlling his edema. This obviously is not very well controlled at this point is been recommended that he use the Lasix twice a day but he has not been able to do that. Unfortunately I think this is leading to an issue where honestly he is not really able to effectively control his edema and therefore the wounds really are not doing significantly better. I do not think that he is going to be able to keep things under good control unless he is able to control his edema much better. I discussed this again in great detail with him today. 01/12/2020 good news is patient actually appears to be doing quite well today at this point. He does have an appointment with lymphedema clinic tomorrow. His legs appear healed and the toe on the left is almost completely healed. In general I am very pleased with how things stand at this point. 01/19/2020 upon evaluation today patient appears to actually be doing well in regard to his lower extremities there is nothing open at this point. Fortunately he has done extremely well more recently. Has been seeing lymphedema clinic as well. With that being said he has Velcro wraps for his lower legs as well as his upper legs. The only wound really is on his toe which is the right great toe and this is barely anything even there. With all that being said I think it is good to be appropriate today to go ahead and switch him over to the Velcro compression wraps. 01/26/2020 upon evaluation today patient appears to be doing worse with regard to his lower extremities  after last week switch him to Velcro compression wraps. Unfortunately he lasted less than 24 hours he did not have the sock portion of his Velcro wrap on the left leg and subsequently developed a blister underneath the Velcro portion. Obviously this is not good  and not what we were looking for at this point. He states the lymphedema clinic did tell him to wear the wrap for 23 hours and take him off for 1 I am okay with that plan but again right now we got a get things back under control again he may have some cellulitis noted as well. 02/02/2020 upon evaluation today patient unfortunately appears to have several areas of blistering on his bilateral lower extremities today mainly on the feet. His legs do seem to be doing somewhat better which is good news. Fortunately there is no evidence of active infection at this time. No fevers, chills, nausea, vomiting, or diarrhea. 02/16/2020 upon evaluation today patient appears to be doing well at this time with regard to his legs. He has a couple weeping areas on his toes but for the most part everything is doing better and does appear to be sealed up on his legs which is excellent news. We can continue with wrapping him at this point as he had every time we discontinue the wraps he just breaks out with new wounds. There is really no point in is going forward with this at this point. 03/08/2020 upon evaluation today patient actually appears to be doing quite well with regard to his lower extremity ulcers. He has just a very superficial and really almost nonexistent blister on the left lower extremity he has in general done very well with the compression wraps. With that being said I do not see any signs of infection at this time which is good news. 03/29/2020 upon evaluation today patient appears to be doing well with regard to his wounds currently except for where he had several new areas that opened up due to some of the wrap slipping and causing him trouble. He  states he did not realize they had slipped. Nonetheless he has a 1 area on the right and 3 new areas on the left. Fortunately there is no signs of active infection at this time which is great news. 04/05/2020 upon evaluation today patient actually appears to be doing quite well in general in regard to his legs currently. Fortunately there is no signs of active infection at this time. No fevers, chills, nausea, vomiting, or diarrhea. He tells me next week that he will actually be seen in the lymphedema clinic on Thursday at 10 AM I see him on Wednesday next week. 04/12/2020 upon evaluation today patient appears to be doing very well with regard to his lower extremities bilaterally. In fact he does not appear to have any open wounds at this point which is good news. Fortunately there is no signs of active infection at this time. No fevers, chills, nausea, vomiting, or diarrhea. 04/19/2020 upon evaluation today patient appears to be doing well with regard to his wounds currently on the bilateral lower extremities. There does not appear to be any signs of active infection at this time. Fortunately there is no evidence of systemic infection and overall very pleased at this point. Nonetheless after I held him out last week he literally had blisters the next morning already which swelled up with him being right back here in the clinic. Overall I think that he is just not can be able to be discharged with his legs the way they are he is much to volume overloaded as far as fluid is concerned and that was discussed with him today of also discussed this but should try the clinic nurse manager as well as Dr. Leanord Hawking. 04/26/2020 upon  evaluation today patient appears to be doing better with regard to his wounds currently. He is making some progress and overall swelling is under good control with the compression wraps. Fortunately there is no evidence of active infection at this time. 05/10/2020 on evaluation today patient  appears to be doing overall well in regard to his lower extremities bilaterally. He is Tolerating the compression wraps without complication and with what we are seeing currently I feel like that he is making excellent progress. There is no signs of active infection at this time. 05/24/2020 upon evaluation today patient appears to be doing well in regard to his legs. The swelling is actually quite a bit down compared to where it has been in the past. Fortunately there is no sign of active infection at this time which is also good news. With that being said he does have several wounds on his toes that have opened up at this point. 05/31/2020 upon evaluation today patient appears to be doing well with regard to his legs bilaterally where he really has no significant fluid buildup at this point overall he seems to be doing quite well. Very pleased in this regard. With regard to his toes these also seem to be drying up which is excellent. We have continue to wrap him as every time we tried as a transition to the juxta light wraps things just do not seem to get any better. 06/07/2020 upon evaluation today patient appears to be doing well with regard to his right leg at this point. Unfortunately left leg has a lot of blistering he tells me the wrap started to slide down on him when he tried to put his other Velcro wrap over top of it to help keep things in order but nonetheless still had some issues. 06/14/2020 on evaluation today patient appears to be doing well with regard to his lower extremity ulcers and foot ulcers at this point. I feel like everything is actually showing signs of improvement which is great news overall there is no signs of active infection at this time. No fevers, chills, nausea, vomiting, or diarrhea. 06/21/2020 on evaluation today patient actually appears to be doing okay in regard to his wounds in general. With that being said the biggest issue I see is on his right foot in particular the  first and second toe seem to be doing a little worse due to the fact this is staying very wet. I think he is probably getting need to change out his dressings a couple times in between each week when we see him in regard to his toes in order to keep this drier based on the location and how this is proceeding. 06/28/2020 on evaluation today patient appears to be doing a little bit more poorly overall in regard to the appearance of the skin I am actually somewhat concerned about the possibility of him having a little bit of an infection here. We discussed the course of potentially giving him a doxycycline prescription which he is taken previously with good result. With that being said I do believe that this is potentially mild and at this point easily fixed. I just do not want anything to get any worse. 07/12/2020 upon evaluation today patient actually appears to be making some progress with regard to his legs which is great news there does not appear to be any evidence of active infection. Overall very pleased with where things stand. 07/26/2020 upon evaluation today patient appears to be doing well with regard  to his leg ulcers and toe ulcers at this point. He has been tolerating the compression wraps without complication overall very pleased in this regard. 08/09/2020 upon evaluation today patient appears to be doing well with regard to his lower extremities bilaterally. Fortunately there is no signs of active infection overall I am pleased with where things stand. 08/23/2020 on evaluation today patient appears to be doing well with regard to his wound. He has been tolerating the dressing changes without complication. Fortunately there is no signs of active infection at this time. Overall his legs seem to be doing quite well which is great news and I am very pleased in that regard. No fevers, chills, nausea, vomiting, or diarrhea. 09/13/2020 upon evaluation today patient appears to be doing okay in  regard to his lower extremities. He does have a fairly large blister on the right leg which I did remove the blister tissue from today so we can get this to dry out other than that however he seems to be doing quite well. There is no signs of active infection at this time. 09/27/2020 upon evaluation today patient appears to actually be doing some better in regard to his right leg. Fortunately signs of active infection at this time which is great news. No fevers, chills, nausea, vomiting, or diarrhea. 10/04/2020 upon evaluation today patient actually appears to be showing signs of improvement which is great news with regard to his leg ulcers. Fortunately there is no signs of active infection which is great news he is still taking the antibiotics currently. No fevers, chills, nausea, vomiting, or diarrhea. 10/18/2020 on evaluation today patient appears to be doing well with regard to his legs currently. He has been tolerating the dressing changes including the wraps without complication. Fortunately there is no signs of active infection at this time. No fevers, chills, nausea, vomiting, or diarrhea. 10/25/2020 upon evaluation today patient appears to be doing decently well in regard to his wounds currently. He has been tolerating the dressing changes without complication. Overall I feel like he is making good progress albeit slow. Again this is something we can have to continue to wrap for some time to come most likely. 11/08/2020 upon evaluation today patient appears to be doing well with regard to his wounds currently. He has been tolerating the dressing changes without complication is not currently on any antibiotics and he does not appear to show any signs of infection. He does continue to have a lot of drainage on the right leg not too severe but nonetheless this is very scattered. On the left leg this is looking to be much improved overall. 11/15/2020 upon evaluation today patient appears to be doing  better with regard to his legs bilaterally. Especially the right leg which was much more significant last week. There does not appear to be any signs of active infection which is great news. No fevers, chills, nausea, vomiting, or diarrhea. 11/23/2019 upon evaluation today patient appears to be doing poorly still in regard to his lower extremities bilaterally. Unfortunately his right leg in particular appears to be doing much more poorly there is no signs really of infection this is not warm to touch but he does have a lot of drainage and weeping unfortunately. With that reason I do believe that we may need to initiate some treatment here to try to help calm down some of the swelling of the right leg. I think switching to a 4-layer compression wrap would be beneficial here. The patient is in  agreement with giving this a try. 11/29/2020 upon evaluation today patient appears to be doing well currently in regard to his leg ulcers. I feel like the right leg is doing better he still has a lot of drainage but we do see some improvement here. The 4-layer compression wrap I think was helpful. 12/06/2020 upon evaluation today patient appears to be doing well with regard to his legs. In fact they seem to be doing about the best I have seen up to this point. Fortunately there is no signs of active infection at this time. No fevers, chills, nausea, vomiting, or diarrhea. 12/20/2020 upon evaluation today patient appears to be doing well at this time in regard to his legs. He is not having any significant draining which is great news. Fortunately there is no signs of active infection at this time. No fevers, chills, nausea, vomiting, or diarrhea. 01/17/2021 upon evaluation today Edan actually appears to be doing excellent in regard to his legs. He has a few areas again that come and go as far as his toes are concerned but overall this is doing quite well. Objective Constitutional Obese and well-hydrated in no acute  distress. Vitals Time Taken: 10:35 AM, Height: 70 in, Weight: 380.2 lbs, BMI: 54.5, Temperature: 98.3 F, Pulse: 90 bpm, Respiratory Rate: 20 breaths/min, Blood Pressure: 176/81 mmHg, Capillary Blood Glucose: 202 mg/dl. Respiratory normal breathing without difficulty. Psychiatric this patient is able to make decisions and demonstrates good insight into disease process. Alert and Oriented x 3. pleasant and cooperative. General Notes: Upon inspection patient's wound bed actually showed signs of good granulation at this point. There does not appear to be any evidence of infection this is in regard to the toes and overall I feel like he is managing quite nicely. I do think that the compression wrap still are doing a great job for him where he is in bilateral 4-layer compression wraps. Integumentary (Hair, Skin) Wound #191 status is Healed - Epithelialized. Original cause of wound was Gradually Appeared. The date acquired was: 12/20/2020. The wound has been in treatment 4 weeks. The wound is located on the Left,Lateral,Dorsal Foot. The wound measures 0cm length x 0cm width x 0cm depth; 0cm^2 area and 0cm^3 volume. There is no tunneling or undermining noted. There is a none present amount of drainage noted. There is no granulation within the wound bed. There is no necrotic tissue within the wound bed. Wound #192 status is Open. Original cause of wound was Gradually Appeared. The date acquired was: 01/17/2021. The wound is located on the Left T Great. oe The wound measures 0.4cm length x 0.5cm width x 0.1cm depth; 0.157cm^2 area and 0.016cm^3 volume. There is Fat Layer (Subcutaneous Tissue) exposed. There is no tunneling or undermining noted. There is a medium amount of serosanguineous drainage noted. The wound margin is distinct with the outline attached to the wound base. There is large (67-100%) pink granulation within the wound bed. There is no necrotic tissue within the wound bed. 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 Post procedure Diagnosis Wound #: Same as Pre-Procedure There was a  Four Layer Compression Therapy Procedure by Fonnie Mu, RN. Post procedure Diagnosis Wound #: Same as Pre-Procedure Plan Follow-up Appointments: Return Appointment in 2 weeks. - MD Nurse Visit: - 1 week Bathing/ Shower/ Hygiene: May shower with protection but do not get wound dressing(s) wet. Edema Control - Lymphedema / SCD / Other: Lymphedema Pumps. Use Lymphedema pumps on leg(s) 2-3 times a day for 45-60 minutes. If wearing any wraps or hose, do not remove them. Continue exercising as instructed. Elevate legs to the level of the heart or above for 30 minutes daily and/or when sitting, a frequency of: - throughout the day Avoid standing for long periods of time. Exercise regularly Non Wound Condition: Other Non Wound Condition Orders/Instructions: - lotion to both legs, 4 layer compression wraps right and left legs WOUND #192: - T Great Wound Laterality: Left oe Prim Dressing: KerraCel Ag Gelling Fiber Dressing, 2x2 in (silver alginate) 1 x Per Week/7 Days ary Discharge Instructions: Apply silver alginate to wound bed as instructed Secondary Dressing: Woven Gauze Sponges 2x2 in 1 x Per Week/7 Days Discharge Instructions: Apply over primary dressing as directed. Secured With: Coban Self-Adherent Wrap 4x5 (in/yd) 1 x Per Week/7 Days Discharge Instructions: Secure with Coban as directed. Secured  With: Insurance underwriter, Sterile 2x75 (in/in) 1 x Per Week/7 Days Discharge Instructions: Secure with stretch gauze as directed. 1. Would recommend currently that we going to continue with the wound care measures as before. Again for any open areas we will use silver alginate. However this is minimal at this point. Otherwise going to continue with the 4-layer compression wrap that seems to be for the most part doing a great job for him. 2. I do recommend as always of the patient continue to use his lymphedema pumps. I am really not certain how often he does this but nonetheless I think that would be ideal. He also needs to be elevating his legs much as possible and taking his Lasix again as much as he can up to what the doctor has recommended. We will see patient back for reevaluation in 2 weeks here in the clinic. If anything worsens or changes patient will contact our office for additional recommendations. Electronic Signature(s) Signed: 01/17/2021 11:13:40 AM By: Lenda Kelp PA-C Entered By: Lenda Kelp on 01/17/2021 11:13:40 -------------------------------------------------------------------------------- SuperBill Details Patient Name: Date of Service: [name], [name] 01/17/2021 Medical Record Number: [id number] Patient Account Number: [id number] Date of Birth/Sex: Treating RN: 10-18-1951 (70 y.o. [name] Primary Care Provider: Nicoletta Ba Other Clinician: Referring Provider: Treating Provider/Extender: Adele Dan in Treatment: 260 Diagnosis Coding ICD-10 Codes Code Description [phone] Non-pressure chronic ulcer of right calf limited to breakdown of skin L97.221 Non-pressure chronic ulcer of left calf limited to breakdown of skin 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 of bilateral lower extremity I89.0 Lymphedema, not elsewhere classified E11.622 Type 2 diabetes mellitus with  other skin ulcer 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, unspecified L03.116 Cellulitis of left lower limb Facility Procedures CPT4: Code [id number] Description: 81 BILATERAL: Application of multi-layer venous compression system; leg (below knee), including ankle and t. Modifier: Quantity: 1 Physician Procedures : CPT4 Code Description Modifier [id number] - WC PHYS LEVEL 3 - EST PT ICD-10 Diagnosis Description L97.211 Non-pressure chronic ulcer of right calf limited to breakdown of skin L97.221 Non-pressure chronic ulcer of left calf limited to breakdown of  skin I87.333 Chronic venous hypertension (idiopathic) with ulcer  and inflammation of bilateral lower extremity I89.0 Lymphedema, not elsewhere classified Quantity: 1 Electronic Signature(s) Signed: 01/17/2021 11:13:55 AM By: Lenda Kelp PA-C Entered By: Lenda Kelp on 01/17/2021 11:13:52

## 2021-01-18 NOTE — Progress Notes (Signed)
HALFORD, GOETZKE ([id number]) Visit Report for 01/17/2021 Arrival Information Details Patient Name: Date of Service: [name], [name] 01/17/2021 10:30 A M Medical Record Number: [id number] Patient Account Number: [id number] Date of Birth/Sex: Treating RN: 09-07-1951 (70 y.o. [name], [name] Primary Care Eleena Grater: [name] Other Clinician: Referring Barbie Croston: Treating Delenn Ahn/Extender: [name] in Treatment: 78 Visit Information History Since Last Visit Added or deleted any medications: No Patient Arrived: Ambulatory Any new allergies or adverse reactions: No Arrival Time: 10:34 Had a fall or experienced change in No Accompanied By: self activities of daily living that may affect Transfer Assistance: None risk of falls: Patient Identification Verified: Yes Signs or symptoms of abuse/neglect since last visito No Secondary Verification Process Completed: Yes Hospitalized since last visit: No Patient Requires Transmission-Based Precautions: No Implantable device outside of the clinic excluding No Patient Has Alerts: Yes cellular tissue based products placed in the [name] since last visit: Has Dressing in Place as Prescribed: Yes Pain Present Now: No Electronic Signature(s) Signed: 01/18/2021 12:58:14 PM By: Sandre Kitty Entered By: Sandre Kitty on 01/17/2021 10:35:07 -------------------------------------------------------------------------------- Compression Therapy Details Patient Name: Date of Service: [name], [name] 01/17/2021 10:30 A M Medical Record Number: [id number] Patient Account Number: [id number] Date of Birth/Sex: Treating RN: [birth year] (70 y.o. [name] Primary Care Kadeja Granada: [name] Other Clinician: Referring Jiovanni Heeter: Treating De Jaworski/Extender: [name] in Treatment: 260 Compression Therapy Performed for Wound Assessment: NonWound Condition Lymphedema - Right  Leg Performed By: Clinician Rhae Hammock, RN Compression Type: Four Layer Post Procedure Diagnosis Same as Pre-procedure Electronic Signature(s) Signed: 01/18/2021 5:44:19 PM By: Baruch Gouty RN, BSN Entered By: Baruch Gouty on 01/17/2021 11:04:49 -------------------------------------------------------------------------------- Compression Therapy Details Patient Name: Date of Service: [name], [name] 01/17/2021 10:30 A M Medical Record Number: [id number] Patient Account Number: [id number] Date of Birth/Sex: Treating RN: 10/21/1951 (70 y.o. [name] Primary Care Bevan Vu: [name] Other Clinician: Referring Hiliana Eilts: Treating Kazimir Hartnett/Extender: [name] in Treatment: 260 Compression Therapy Performed for Wound Assessment: NonWound Condition Lymphedema - Left Leg Performed By: Clinician Rhae Hammock, RN Compression Type: Four Layer Post Procedure Diagnosis Same as Pre-procedure Electronic Signature(s) Signed: 01/18/2021 5:44:19 PM By: Baruch Gouty RN, BSN Entered By: Baruch Gouty on 01/17/2021 11:05:22 -------------------------------------------------------------------------------- Encounter Discharge Information Details Patient Name: Date of Service: [name], [name] 01/17/2021 10:30 A M Medical Record Number: [id number] Patient Account Number: [id number] Date of Birth/Sex: Treating RN: 27-Jul-1951 (70 y.o. [name], [name] Primary Care Jaysen Wey: [name] Other Clinician: Referring Harlee Pursifull: Treating Lumir [name]/Extender: [name] in Treatment: [phone] Encounter Discharge Information Items Discharge Condition: Stable Ambulatory Status: Ambulatory Discharge Destination: Home Transportation: Private Auto Accompanied By: self Schedule Follow-up Appointment: Yes Clinical Summary of Care: Patient Declined Electronic Signature(s) Signed: 01/17/2021 5:17:38 PM By: Rhae Hammock RN Entered By: Rhae Hammock on 01/17/2021 11:31:41 -------------------------------------------------------------------------------- Lower Extremity Assessment Details Patient Name: Date of Service: [name], [name] 01/17/2021 10:30 A M Medical Record Number: [id number] Patient Account Number: [id number] Date of Birth/Sex: Treating RN: [birth year] (70 y.o. [name] Primary Care Dekari Bures: [name] Other Clinician: Referring Ralphine Hinks: Treating Verneice Caspers/Extender: [name] in Treatment: 260 Edema Assessment Assessed: [name]: Yes] [name]: Yes] Edema: [Left: Yes] [Right: Yes] Calf Left: Right: Point of Measurement: 25 cm From Medial Instep 42.5 cm 39.5 cm Ankle Left: Right: Point of Measurement: 9 cm From Medial Instep 28 cm 26 cm Electronic Signature(s)  Signed: 01/17/2021 4:41:44 PM By: Deon Pilling Entered By: Deon Pilling on 01/17/2021 10:55:43 -------------------------------------------------------------------------------- Reform Details Patient Name: Date of Service: [name], Kiyoto J. 01/17/2021 10:30 A M Medical Record Number: [id number] Patient Account Number: [id number] Date of Birth/Sex: Treating RN: 1951/08/14 (70 y.o. [name] Primary Care Biddie Sebek: [name] Other Clinician: Referring Sharren Schnurr: Treating [name]/Extender: [name] in Treatment: [place] reviewed with physician Active Inactive Venous Leg Ulcer Nursing Diagnoses: Actual venous Insuffiency (use after diagnosis is confirmed) Goals: Patient will maintain optimal edema control Date Initiated: 09/10/2016 Target Resolution Date: 02/14/2021 Goal Status: Active Verify adequate tissue perfusion prior to therapeutic compression application Date Initiated: 09/10/2016 Date Inactivated: 11/28/2016 Goal Status: Met Interventions: Assess peripheral edema status every  visit. Compression as ordered Provide education on venous insufficiency Notes: Electronic Signature(s) Signed: 01/18/2021 5:44:19 PM By: Baruch Gouty RN, BSN Entered By: Baruch Gouty on 01/17/2021 11:02:11 -------------------------------------------------------------------------------- Pain Assessment Details Patient Name: Date of Service: [name], [name] 01/17/2021 10:30 A M Medical Record Number: [id number] Patient Account Number: [id number] Date of Birth/Sex: Treating RN: 1951/09/10 (70 y.o. [name] Primary Care Terryl Molinelli: [name] Other Clinician: Referring Rafik Koppel: Treating Danny Yackley/Extender: [name] in Treatment: [phone] Active Problems Location of Pain Severity and Description of Pain Patient Has Paino No Site Locations Pain Management and Medication Current Pain Management: Electronic Signature(s) Signed: 01/18/2021 12:58:14 PM By: Sandre Kitty Signed: 01/18/2021 5:44:19 PM By: Baruch Gouty RN, BSN Entered By: Sandre Kitty on 01/17/2021 10:36:27 -------------------------------------------------------------------------------- Patient/Caregiver Education Details Patient Name: Date of Service: [name], [name] 3/16/2022andnbsp10:30 [place] Record Number: [id number] Patient Account Number: [id number] Date of Birth/Gender: Treating RN: September 15, 1951 (70 y.o. [name] Primary Care Physician: [name] Other Clinician: Referring Physician: Treating Physician/Extender: [name] in Treatment: [phone] Education Assessment Education Provided To: Patient Education Topics Provided Venous: Methods: Explain/Verbal Responses: Reinforcements needed, State content correctly Electronic Signature(s) Signed: 01/18/2021 5:44:19 PM By: Baruch Gouty RN, BSN Entered By: Baruch Gouty on 01/17/2021  11:02:37 -------------------------------------------------------------------------------- Wound Assessment Details Patient Name: Date of Service: [name], [name] 01/17/2021 10:30 A M Medical Record Number: [id number] Patient Account Number: [id number] Date of Birth/Sex: Treating RN: [birth year] (70 y.o. [name] Primary Care Myka Lukins: [name] Other Clinician: Referring Adyn Hoes: Treating Kaydince Towles/Extender: [name] in Treatment: 260 Wound Status Wound Number: [id number] Primary Lymphedema Etiology: Wound Location: Left, Lateral, Dorsal Foot Secondary Diabetic Wound/Ulcer of the Lower Extremity Wounding Event: Gradually Appeared Etiology: Date Acquired: 12/20/2020 Wound Healed - Epithelialized Weeks Of Treatment: 4 Status: Clustered Wound: No Comorbid Chronic sinus problems/congestion, Arrhythmia, Hypertension, History: Peripheral Arterial Disease, Type II Diabetes, History of Burn, Gout, Confinement Anxiety Wound Measurements Length: (cm) Width: (cm) Depth: (cm) Area: (cm) Volume: (cm) 0 % Reduction in Area: 100% 0 % Reduction in Volume: 100% 0 Epithelialization: Large (67-100%) 0 Tunneling: No 0 Undermining: No Wound Description Classification: Full Thickness Without Exposed Support Structures Exudate Amount: None Present Foul Odor After Cleansing: No Slough/Fibrino No Wound Bed Granulation Amount: None Present (0%) Exposed Structure Necrotic Amount: None Present (0%) Fascia Exposed: No Fat Layer (Subcutaneous Tissue) Exposed: No Tendon Exposed: No Muscle Exposed: No Joint Exposed: No Bone Exposed: No Treatment Notes Wound #191 (Foot) Wound Laterality: Dorsal, Left, Lateral Cleanser Peri-Wound Care Topical Primary Dressing Secondary Dressing Secured With Compression Wrap Compression Stockings Add-Ons Electronic Signature(s) Signed: 01/18/2021 5:44:19 PM By: Baruch Gouty RN, BSN Entered  By: Baruch Gouty on  01/17/2021 11:10:25 -------------------------------------------------------------------------------- Wound Assessment Details Patient Name: Date of Service: [name], [name] 01/17/2021 10:30 A M Medical Record Number: [id number] Patient Account Number: [id number] Date of Birth/Sex: Treating RN: [birth year] (70 y.o. [name] Primary Care Tawnia Schirm: [name] Other Clinician: Referring Sydny Schnitzler: Treating Doloros Kwolek/Extender: [name] in Treatment: 260 Wound Status Wound Number: [id number] Primary Diabetic Wound/Ulcer of the Lower Extremity Etiology: Wound Location: Left T Great oe Wound Open Wounding Event: Gradually Appeared Status: Date Acquired: 01/17/2021 Comorbid Chronic sinus problems/congestion, Arrhythmia, Hypertension, Weeks Of Treatment: 0 History: Peripheral Arterial Disease, Type II Diabetes, History of Burn, Clustered Wound: No Gout, Confinement Anxiety Wound Measurements Length: (cm) 0.4 Width: (cm) 0.5 Depth: (cm) 0.1 Area: (cm) 0.157 Volume: (cm) 0.016 % Reduction in Area: 0% % Reduction in Volume: 0% Epithelialization: Small (1-33%) Tunneling: No Undermining: No Wound Description Classification: Grade 1 Wound Margin: Distinct, outline attached Exudate Amount: Medium Exudate Type: Serosanguineous Exudate Color: red, brown Foul Odor After Cleansing: No Slough/Fibrino No Wound Bed Granulation Amount: Large (67-100%) Exposed Structure Granulation Quality: Pink Fascia Exposed: No Necrotic Amount: None Present (0%) Fat Layer (Subcutaneous Tissue) Exposed: Yes Tendon Exposed: No Muscle Exposed: No Joint Exposed: No Bone Exposed: No Treatment Notes Wound #192 (Toe Great) Wound Laterality: Left Cleanser Peri-Wound Care Topical Primary Dressing KerraCel Ag Gelling Fiber Dressing, 2x2 in (silver alginate) Discharge Instruction: Apply silver alginate to wound bed as instructed Secondary Dressing Woven Gauze Sponges  2x2 in Discharge Instruction: Apply over primary dressing as directed. Secured With [employer] 4x5 (in/yd) Discharge Instruction: Secure with Coban as directed. Conforming Stretch Gauze Bandage, Sterile 2x75 (in/in) Discharge Instruction: Secure with stretch gauze as directed. Compression Wrap Compression Stockings Add-Ons Electronic Signature(s) Signed: 01/17/2021 4:41:44 PM By: Deon Pilling Entered By: Deon Pilling on 01/17/2021 10:54:22 -------------------------------------------------------------------------------- Vitals Details Patient Name: Date of Service: [name], [name] 01/17/2021 10:30 A M Medical Record Number: [id number] Patient Account Number: [id number] Date of Birth/Sex: Treating RN: [birth year] (70 y.o. [name] Primary Care Rennee Coyne: [name] Other Clinician: Referring Alasia Enge: Treating Kloie Whiting/Extender: [name] in Treatment: 260 Vital Signs Time Taken: 10:35 Temperature (F): 98.3 Height (in): 70 Pulse (bpm): 90 Weight (lbs): 380.2 Respiratory Rate (breaths/min): 20 Body Mass Index (BMI): 54.5 Blood Pressure (mmHg): 176/81 Capillary Blood Glucose (mg/dl): 202 Reference Range: 80 - 120 mg / dl Electronic Signature(s) Signed: 01/18/2021 12:58:14 PM By: Sandre Kitty Entered By: Sandre Kitty on 01/17/2021 10:36:19

## 2021-01-19 ENCOUNTER — Other Ambulatory Visit: Payer: Self-pay | Admitting: Family Medicine

## 2021-01-24 ENCOUNTER — Encounter (HOSPITAL_BASED_OUTPATIENT_CLINIC_OR_DEPARTMENT_OTHER): Payer: Medicare Other | Admitting: Physician Assistant

## 2021-01-24 ENCOUNTER — Other Ambulatory Visit: Payer: Self-pay

## 2021-01-24 DIAGNOSIS — I890 Lymphedema, not elsewhere classified: Secondary | ICD-10-CM | POA: Diagnosis not present

## 2021-01-24 DIAGNOSIS — I272 Pulmonary hypertension, unspecified: Secondary | ICD-10-CM | POA: Diagnosis not present

## 2021-01-24 DIAGNOSIS — I509 Heart failure, unspecified: Secondary | ICD-10-CM | POA: Diagnosis not present

## 2021-01-24 DIAGNOSIS — L97821 Non-pressure chronic ulcer of other part of left lower leg limited to breakdown of skin: Secondary | ICD-10-CM | POA: Diagnosis not present

## 2021-01-24 DIAGNOSIS — I87333 Chronic venous hypertension (idiopathic) with ulcer and inflammation of bilateral lower extremity: Secondary | ICD-10-CM | POA: Diagnosis not present

## 2021-01-24 DIAGNOSIS — L97511 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 of skin: Secondary | ICD-10-CM | POA: Diagnosis not present

## 2021-01-24 DIAGNOSIS — I110 Hypertensive heart disease with heart failure: Secondary | ICD-10-CM | POA: Diagnosis not present

## 2021-01-24 DIAGNOSIS — Z794 Long term (current) use of insulin: Secondary | ICD-10-CM | POA: Diagnosis not present

## 2021-01-24 DIAGNOSIS — L97221 Non-pressure chronic ulcer of left calf limited to breakdown of skin: Secondary | ICD-10-CM | POA: Diagnosis not present

## 2021-01-24 DIAGNOSIS — L97521 Non-pressure chronic ulcer of other 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 of skin: Secondary | ICD-10-CM | POA: Diagnosis not present

## 2021-01-24 DIAGNOSIS — E1151 Type 2 diabetes mellitus with diabetic peripheral angiopathy without gangrene: Secondary | ICD-10-CM | POA: Diagnosis not present

## 2021-01-24 DIAGNOSIS — E1140 Type 2 diabetes mellitus with diabetic neuropathy, unspecified: Secondary | ICD-10-CM | POA: Diagnosis not present

## 2021-01-24 DIAGNOSIS — I482 Chronic atrial fibrillation, unspecified: Secondary | ICD-10-CM | POA: Diagnosis not present

## 2021-01-24 DIAGNOSIS — E11622 Type 2 diabetes mellitus with other skin ulcer: Secondary | ICD-10-CM | POA: Diagnosis not present

## 2021-01-24 DIAGNOSIS — E11621 Type 2 diabetes mellitus with foot ulcer: Secondary | ICD-10-CM | POA: Diagnosis not present

## 2021-01-24 DIAGNOSIS — L97211 Non-pressure chronic ulcer of right calf limited to breakdown of skin: Secondary | ICD-10-CM | POA: Diagnosis not present

## 2021-01-24 NOTE — Progress Notes (Signed)
[name], [name] ([id number]) Visit Report for 01/24/2021 SuperBill Details Patient Name: Date of Service: [name], [name] 01/24/2021 Medical Record Number: [id number] Patient Account Number: [id number] Date of Birth/Sex: Treating RN: 1950/11/17 (70 y.o. [name], [name] Primary Care Provider: Nicoletta Ba Other Clinician: Referring Provider: Treating Provider/Extender: Adele Dan in Treatment: 261 Diagnosis Coding ICD-10 Codes Code Description [phone] Non-pressure chronic ulcer of right calf limited to breakdown of skin L97.221 Non-pressure chronic ulcer of left calf limited to breakdown of skin 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 of bilateral lower extremity I89.0 Lymphedema, not elsewhere classified E11.622 Type 2 diabetes mellitus with other skin ulcer 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, unspecified L03.116 Cellulitis of left lower limb Facility Procedures CPT4 Description Modifier Quantity Code [id number] BILATERAL: Application of multi-layer venous compression system; leg (below knee), including ankle and 1 foot. Electronic Signature(s) Signed: 01/24/2021 5:29:52 PM By: Lenda Kelp PA-C Signed: 01/24/2021 6:09:10 PM By: Shawn Stall Entered By: Shawn Stall on 01/24/2021 11:54:09

## 2021-01-25 NOTE — Progress Notes (Signed)
BRIX, BREARLEY ([id number]) Visit Report for 01/24/2021 Arrival Information Details Patient Name: Date of Service: [name], [name] 01/24/2021 10:30 A M Medical Record Number: [id number] Patient Account Number: [id number] Date of Birth/Sex: Treating RN: 1950/11/29 (70 y.o. [name] Primary Care Raechel Marcos: Nicoletta Ba Other Clinician: Referring Ranita Stjulien: Treating Riordan Walle/Extender: Adele Dan in Treatment: 261 Visit Information History Since Last Visit Added or deleted any medications: No Patient Arrived: Wheel Chair Any new allergies or adverse reactions: No Arrival Time: 11:11 Had a fall or experienced change in No Accompanied By: alone activities of daily living that may affect Transfer Assistance: None risk of falls: Patient Identification Verified: Yes Signs or symptoms of abuse/neglect since last visito No Secondary Verification Process Completed: Yes Hospitalized since last visit: No Patient Requires Transmission-Based Precautions: No Implantable device outside of the clinic excluding No Patient Has Alerts: Yes cellular tissue based products placed in the center since last visit: Has Dressing in Place as Prescribed: Yes Has Compression in Place as Prescribed: Yes Pain Present Now: No Electronic Signature(s) Signed: 01/25/2021 5:04:27 PM By: [name] RN, BSN Entered By: [name] on 01/24/2021 11:12:00 -------------------------------------------------------------------------------- Compression Therapy Details Patient Name: Date of Service: [name], [name] 01/24/2021 10:30 A M Medical Record Number: [id number] Patient Account Number: [id number] Date of Birth/Sex: Treating RN: 05-29-1951 (70 y.o. [name] Primary Care Lewi Drost: Nicoletta Ba Other Clinician: Referring Breyana Follansbee: Treating Braylen Denunzio/Extender: Adele Dan in Treatment: 261 Compression Therapy Performed for Wound Assessment:  NonWound Condition Lymphedema - Right Leg Performed By: Clinician Shawn Stall, RN Compression Type: Four Layer Electronic Signature(s) Signed: 01/24/2021 6:09:10 PM By: Shawn Stall Entered By: Shawn Stall on 01/24/2021 11:50:21 -------------------------------------------------------------------------------- Compression Therapy Details Patient Name: Date of Service: Kevin, [name] 01/24/2021 10:30 A M Medical Record Number: [id number] Patient Account Number: [id number] Date of Birth/Sex: Treating RN: 1950/11/12 (70 y.o. [name] Primary Care Elorah Dewing: Nicoletta Ba Other Clinician: Referring Nigil Braman: Treating Zaxton Angerer/Extender: Adele Dan in Treatment: 261 Compression Therapy Performed for Wound Assessment: NonWound Condition Lymphedema - Left Leg Performed By: Clinician Shawn Stall, RN Compression Type: Four Layer Electronic Signature(s) Signed: 01/24/2021 6:09:10 PM By: Shawn Stall Entered By: Shawn Stall on 01/24/2021 11:50:41 -------------------------------------------------------------------------------- Encounter Discharge Information Details Patient Name: Date of Service: [facility] [place], [name] 01/24/2021 10:30 A M Medical Record Number: [id number] Patient Account Number: [id number] Date of Birth/Sex: Treating RN: 03/03/1951 (70 y.o. [name] Primary Care Takao Lizer: Nicoletta Ba Other Clinician: Referring Carliss Quast: Treating Kelsen Celona/Extender: Adele Dan in Treatment: [phone] Encounter Discharge Information Items Discharge Condition: Stable Ambulatory Status: Walker Discharge Destination: Home Transportation: Private Auto Accompanied By: self Schedule Follow-up Appointment: Yes Clinical Summary of Care: Electronic Signature(s) Signed: 01/24/2021 6:09:10 PM By: Shawn Stall Entered By: Shawn Stall on 01/24/2021  11:54:00 -------------------------------------------------------------------------------- Patient/Caregiver Education Details Patient Name: Date of Service: [name] 3/23/2022andnbsp10:30 A M Medical Record Number: [id number] Patient Account Number: [id number] Date of Birth/Gender: Treating RN: [birth year] (70 y.o. [name] Primary Care Physician: Nicoletta Ba Other Clinician: Referring Physician: Treating Physician/Extender: Adele Dan in Treatment: [phone] Education Assessment Education Provided To: Patient Education Topics Provided Wound/Skin Impairment: Handouts: Skin Care Do's and Dont's Methods: Explain/Verbal Responses: Reinforcements needed Electronic Signature(s) Signed: 01/24/2021 6:09:10 PM By: Shawn Stall Entered By: Shawn Stall on 01/24/2021 11:53:45 -------------------------------------------------------------------------------- Wound Assessment Details Patient Name: Date of Service: [name], [name] 01/24/2021 10:30 A M  Medical Record Number: [id number] Patient Account Number: [id number] Date of Birth/Sex: Treating RN: 11-29-1950 (70 y.o. [name] Primary Care Jaramie Bastos: Nicoletta Ba Other Clinician: Referring Brenlyn Beshara: Treating Arafat Cocuzza/Extender: Adele Dan in Treatment: 261 Wound Status Wound Number: [id number] Primary Etiology: Diabetic Wound/Ulcer of the Lower Extremity Wound Location: Left T Great oe Wound Status: Open Wounding Event: Gradually Appeared Date Acquired: 01/17/2021 Weeks Of Treatment: 1 Clustered Wound: No Wound Measurements Length: (cm) 0.4 Width: (cm) 0.5 Depth: (cm) 0.1 Area: (cm) 0.157 Volume: (cm) 0.016 % Reduction in Area: 0% % Reduction in Volume: 0% Wound Description Classification: Grade 1 Treatment Notes Wound #192 (Toe Great) Wound Laterality: Left Cleanser Peri-Wound Care Topical Primary Dressing KerraCel Ag Gelling Fiber Dressing, 2x2 in  (silver alginate) Discharge Instruction: Apply silver alginate to wound bed as instructed Secondary Dressing Woven Gauze Sponges 2x2 in Discharge Instruction: Apply over primary dressing as directed. Secured With [employer] 4x5 (in/yd) Discharge Instruction: Secure with Coban as directed. Conforming Stretch Gauze Bandage, Sterile 2x75 (in/in) Discharge Instruction: Secure with stretch gauze as directed. Compression Wrap Compression Stockings Add-Ons Notes lotion, abd pads, unna boot first layer to upper portion of lower legs, and 4 layer compression wraps. Electronic Signature(s) Signed: 01/24/2021 6:09:10 PM By: Shawn Stall Entered By: Shawn Stall on 01/24/2021 11:49:40 -------------------------------------------------------------------------------- Vitals Details Patient Name: Date of Service: [name], [name] 01/24/2021 10:30 A M Medical Record Number: [id number] Patient Account Number: [id number] Date of Birth/Sex: Treating RN: 1951/05/17 (70 y.o. [name] Primary Care Adriel Desrosier: Nicoletta Ba Other Clinician: Referring Ameer Sanden: Treating Luzmaria Devaux/Extender: Adele Dan in Treatment: 261 Vital Signs Time Taken: 11:12 Temperature (F): 98.1 Height (in): 70 Pulse (bpm): 78 Weight (lbs): 380.2 Respiratory Rate (breaths/min): 22 Body Mass Index (BMI): 54.5 Blood Pressure (mmHg): 127/76 Capillary Blood Glucose (mg/dl): 470 Reference Range: 80 - 120 mg / dl Notes glucose per pt report Electronic Signature(s) Signed: 01/25/2021 5:04:27 PM By: [name] RN, BSN Entered By: [name] on 01/24/2021 11:12:36

## 2021-01-31 ENCOUNTER — Ambulatory Visit (INDEPENDENT_AMBULATORY_CARE_PROVIDER_SITE_OTHER): Payer: Medicare Other

## 2021-01-31 ENCOUNTER — Other Ambulatory Visit: Payer: Self-pay

## 2021-01-31 ENCOUNTER — Encounter (HOSPITAL_BASED_OUTPATIENT_CLINIC_OR_DEPARTMENT_OTHER): Payer: Medicare Other | Admitting: Physician Assistant

## 2021-01-31 DIAGNOSIS — L97522 Non-pressure chronic ulcer of other part of left foot with fat layer exposed: Secondary | ICD-10-CM | POA: Diagnosis not present

## 2021-01-31 DIAGNOSIS — L03116 Cellulitis of left lower limb: Secondary | ICD-10-CM | POA: Diagnosis not present

## 2021-01-31 DIAGNOSIS — I872 Venous insufficiency (chronic) (peripheral): Secondary | ICD-10-CM

## 2021-01-31 DIAGNOSIS — E1140 Type 2 diabetes mellitus with diabetic neuropathy, unspecified: Secondary | ICD-10-CM | POA: Diagnosis not present

## 2021-01-31 DIAGNOSIS — L03119 Cellulitis of unspecified part of limb: Secondary | ICD-10-CM | POA: Diagnosis not present

## 2021-01-31 DIAGNOSIS — I87312 Chronic venous hypertension (idiopathic) with ulcer of left lower extremity: Secondary | ICD-10-CM | POA: Diagnosis not present

## 2021-01-31 DIAGNOSIS — L97221 Non-pressure chronic ulcer of left calf limited to breakdown of skin: Secondary | ICD-10-CM | POA: Diagnosis not present

## 2021-01-31 DIAGNOSIS — L97821 Non-pressure chronic ulcer of other part of left lower leg limited to breakdown of skin: Secondary | ICD-10-CM | POA: Diagnosis not present

## 2021-01-31 DIAGNOSIS — I110 Hypertensive heart disease with heart failure: Secondary | ICD-10-CM | POA: Diagnosis not present

## 2021-01-31 DIAGNOSIS — I509 Heart failure, unspecified: Secondary | ICD-10-CM | POA: Diagnosis not present

## 2021-01-31 DIAGNOSIS — I272 Pulmonary hypertension, unspecified: Secondary | ICD-10-CM | POA: Diagnosis not present

## 2021-01-31 DIAGNOSIS — Z794 Long term (current) use of insulin: Secondary | ICD-10-CM | POA: Diagnosis not present

## 2021-01-31 DIAGNOSIS — E11621 Type 2 diabetes mellitus with foot ulcer: Secondary | ICD-10-CM | POA: Diagnosis not present

## 2021-01-31 DIAGNOSIS — I87333 Chronic venous hypertension (idiopathic) with ulcer and inflammation of bilateral lower extremity: Secondary | ICD-10-CM | POA: Diagnosis not present

## 2021-01-31 DIAGNOSIS — L97511 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 of skin: Secondary | ICD-10-CM | POA: Diagnosis not present

## 2021-01-31 DIAGNOSIS — L97521 Non-pressure chronic ulcer of other 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 of skin: Secondary | ICD-10-CM | POA: Diagnosis not present

## 2021-01-31 DIAGNOSIS — E1151 Type 2 diabetes mellitus with diabetic peripheral angiopathy without gangrene: Secondary | ICD-10-CM | POA: Diagnosis not present

## 2021-01-31 DIAGNOSIS — E11622 Type 2 diabetes mellitus with other skin ulcer: Secondary | ICD-10-CM | POA: Diagnosis not present

## 2021-01-31 DIAGNOSIS — I482 Chronic atrial fibrillation, unspecified: Secondary | ICD-10-CM | POA: Diagnosis not present

## 2021-01-31 DIAGNOSIS — I890 Lymphedema, not elsewhere classified: Secondary | ICD-10-CM

## 2021-01-31 DIAGNOSIS — L97211 Non-pressure chronic ulcer of right calf limited to breakdown of skin: Secondary | ICD-10-CM | POA: Diagnosis not present

## 2021-01-31 NOTE — Progress Notes (Signed)
Kevin Hellen Cowperis a 70 y.o.malepresents to the office today for monthly bicillininjections, per physician's orders.  Bicillin 600,000 Unitswas administeredbilaterally in Right and Left upper outter quadtoday. Patient tolerated injection.  Please sign off in PCP absence.
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
## 2021-01-31 NOTE — Progress Notes (Addendum)
KIEN, MIRSKY ([id number]) Visit Report for 01/31/2021 Chief Complaint Document Details Patient Name: Date of Service: [name], [name] 01/31/2021 10:30 A M Medical Record Number: [id number] Patient Account Number: [id number] Date of Birth/Sex: Treating RN: [birth year] (70 y.o. [name] Primary Care Provider: Nicoletta Ba Other Clinician: Referring Provider: Treating Provider/Extender: Adele Dan in Treatment: 262 Information Obtained from: Patient Chief Complaint patient is here for evaluation venous/lymphedema weeping Electronic Signature(s) Signed: 01/31/2021 10:23:10 AM By: Lenda Kelp PA-C Entered By: Lenda Kelp on 01/31/2021 10:23:09 -------------------------------------------------------------------------------- HPI Details Patient Name: Date of Service: [name], [name] 01/31/2021 10:30 A M Medical Record Number: [id number] Patient Account Number: [id number] Date of Birth/Sex: Treating RN: 26-Oct-1951 (71 y.o. [name] Primary Care Provider: Nicoletta Ba Other Clinician: Referring Provider: Treating Provider/Extender: Adele Dan in Treatment: 262 History of Present Illness HPI Description: Referred by PCP for consultation. Patient has long standing history of BLE venous stasis, no prior ulcerations. At beginning of month, developed cellulitis and weeping. Received IM Rocephin followed by Keflex and resolved. Wears compression stocking, appr 6 months old. Not sure strength. No present drainage. 01/22/16 this is a patient who is a type II diabetic on insulin. 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 He tells me he religiously wears pressure stockings of uncertain strength. He was here with weeping edema about 8 months ago but did not have an open wound. Roughly a month ago he had a reopening on his bilateral legs. He is been using bandages and Neosporin. He  does not complain of pain. 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 Last BUN/creatinine I have is from 11/20/15 at 13 and 1.0 respectively 01/29/16; patient arrives today having tolerated the Profore wrap. He brought in his stockings and these are 18 mmHg stockings he bought from [place]. The compression here is likely inadequate. He does not complain of pain or excessive drainage she has no systemic symptoms. The wound on the right looks improved as does the one on the left although one on the left is more substantial with still tissue at risk below the actual wound area on the bilateral posterior calf 02/05/16; patient arrives with poor edema control. He states that we did put a 4 layer compression on it last week. No weight appear 5 this. 02/12/16; the area on the posterior right Has healed. The left Has a substantial wound that has necrotic surface eschar that requires a debridement with a curette. 02/16/16;the patient called or a Nurse visit secondary to increased swelling. He had been in earlier in the week with his right leg healed. He was transitioned to is on pressure stocking on the right leg with the only open wound on the left, a substantial area on the left posterior calf. Note he has a history of severe lower extremity edema, he has a history of chronic atrial fibrillation but not heart failure per my notes but I'll need to research this. He is not complaining of chest pain shortness of breath or orthopnea. The intake nurse noted blisters on the previously closed right leg 02/19/16; this is the patient's regular visit day. I see him on Friday with escalating edema new wounds on the right leg and clear signs of at least right ventricular heart failure. I increased his Lasix to 40 twice a day.  He is returning currently in follow-up. States he is noticed a decrease in that the  edema 02/26/16 patient's legs have much less edema. There is nothing really open on the right leg. The left leg has improved condition of the large superficial wound on the posterior left leg 03/04/16; edema control is very much better. The patient's right leg wounds have healed. On the left leg he continues to have severe venous inflammation on the posterior aspect of the left leg. There is no tenderness and I don't think any of this is cellulitis. 03/11/16; patient's right leg is married healed and he is in his own stocking. The patient's left leg has deteriorated somewhat. There is a lot of erythema around the wound on the posterior left leg. There is also a significant rim of erythema posteriorly just above where the wrap would've ended there is a new wound in this location and a lot of tenderness. Can't rule out cellulitis in this area. 03/15/16; patient's right leg remains healed and he is in his own stocking. The patient's left leg is much better than last review. His major wound on the posterior aspect of his left Is almost fully epithelialized. He has 3 small injuries from the wraps. Really. Erythema seems a lot better on antibiotics 03/18/16; right leg remains healed and he is in his own stocking. The patient's left leg is much better. The area on the posterior aspect of the left calf is fully epithelialized. His 3 small injuries which were wrap injuries on the left are improved only one seems still open his erythema has resolved 03/25/16; patient's right leg remains healed and he is in his own stocking. There is no open area today on the left leg posterior leg is completely closed up. His wrap injuries at the superior aspect of his leg are also resolved. He looks as though he has some irritation on the dorsal ankle but this is fully epithelialized without evidence of infection. 03/28/16; we discharged this patient on Monday. Transitioned him into his own stocking. There were problems almost  immediately with uncontrolled swelling weeping edema multiple some of which have opened. He does not feel systemically unwell in particular no chest pain no shortness of breath and he does not feel 04/08/16; the edema is under better control with the Profore light wrap but he still has pitting edema. There is one large wound anteriorly 2 on the medial aspect of his left leg and 3 small areas on the superior posterior calf. Drainage is not excessive he is tolerating a Profore light well 04/15/16; put a Profore wrap on him last week. This is controlled is edema however he had a lot of pain on his left anterior foot most of his wounds are healed 04/22/16 once again the patient has denuded areas on the left anterior foot which he states are because his wrap slips up word. He saw his primary physician today is on Lasix 40 twice a day and states that he his weight is down 20 pounds over the last 3 months. 04/29/16: Much improved. left anterior foot much improved. He is now on Lasix 80 mg per day. Much improved edema control 05/06/16; I was hoping to be able to discharge him today however once again he has blisters at a low level of where the compression was placed last week mostly on his left lateral but also his left medial leg and a small area on the anterior part of the left foot. 05/09/16; apparently the patient went  home after his appointment on 7/4 later in the evening developing pain in his upper medial thigh together with subjective fever and chills although his temperature was not taken. The pain was so intense he felt he would probably have to call 911. However he then remembered that he had leftover doxycycline from a previous round of antibiotics and took these. By the next morning he felt a lot better. He called and spoke to one of our nurses and I approved doxycycline over the phone thinking that this was in relation to the wounds we had previously seen although they were definitely were not. The  patient feels a lot better old fever no chills he is still working. Blood sugars are reasonably controlled 05/13/16; patient is back in for review of his cellulitis on his anterior medial upper thigh. He is taking doxycycline this is a lot better. Culture I did of the nodular area on the dorsal aspect of his foot grew MRSA this also looks a lot better. 05/20/16; the patient is cellulitis on the medial upper thigh has resolved. All of his wound areas including the left anterior foot, areas on the medial aspect of the left calf and the lateral aspect of the calf at all resolved. He has a new blister on the left dorsal foot at the level of the fourth toe this was excised. No evidence of infection 05/27/16; patient continues to complain weeping edema. He has new blisterlike wounds on the left anterior lateral and posterior lateral calf at the top of his wrap levels. The area on his left anterior foot appears better. He is not complaining of fever, pain or pruritus in his feet. 05/30/16; the patient's blisters on his left anterior leg posterior calf all look improved. He did not increase the Lasix 100 mg as I suggested because he was going to run out of his 40 mg tablets. He is still having weeping edema of his toes 06/03/16; I renewed his Lasix at 80 mg once a day as he was about to run out when I last saw him. He is on 80 mg of Lasix now. I have asked him to cut down on the excessive amount of water he was drinking and asked him to drink according to his thirst mechanisms 06/12/2016 -- was seen 2 days ago and was supposed to wear his compression stockings at home but he is developed lymphedema and superficial blisters on the left lower extremity and hence came in for a review 06/24/16; the remaining wound is on his left anterior leg. He still has edema coming from between his toes. There is lymphedema here however his edema is generally better than when I last saw this. He has a history of atrial fibrillation  but does not have a known history of congestive heart failure nevertheless I think he probably has this at least on a diastolic basis. 07/01/16 I reviewed his echocardiogram from January 2017. This was essentially normal. He did not have LVH, EF of 55-60%. His right ventricular function was normal although he did have trivial tricuspid and pulmonic regurgitation. This is not audible on exam however. I increased his Lasix to do massive edema in his legs well above his knees I think in early July. He was also drinking an excessive amount of water at the time. 07/15/16; missed his appointment last week because of the Labor Day holiday on Monday. He could not get another appointment later in the week. Started to feel the wrap digging in superiorly so  we remove the top half and the bottom half of his wrap. He has extensive erythema and blistering superiorly in the left leg. Very tender. Very swollen. Edema in his foot with leaking edema fluid. He has not been systemically unwell 07/22/16; the area on the left leg laterally required some debridement. The medial wounds look more stable. His wrap injury wounds appear to have healed. Edema and his foot is better, weeping edema is also better. He tells me he is meeting with the supplier of the external compression pumps at work 08/05/16; the patient was on vacation last week in [facility]. His wrap is been on for an extended period of time. Also over the weekend he developed an extensive area of tender erythema across his anterior medial thigh. He took to doxycycline yesterday that he had leftover from a previous prescription. The patient complains of weeping edema coming out of his toes 08/08/16; I saw this patient on 10/2. He was tender across his anterior thigh. I put him on doxycycline. He returns today in follow-up. He does not have any open wounds on his lower leg, he still has edema weeping into his toes. 08/12/16; patient was seen back urgently today to  follow-up for his extensive left thigh cellulitis/erysipelas. He comes back with a lot less swelling and erythema pain is much better. I believe I gave him Augmentin and Cipro. His wrap was cut down as he stated a roll down his legs. He developed blistering above the level of the wrap that remained. He has 2 open blisters and 1 intact. 08/19/16; patient is been doing his primary doctor who is increased his Lasix from 40-80 once a day or 80 already has less edema. Cellulitis has remained improved in the left thigh. 2 open areas on the posterior left calf 08/26/16; he returns today having new open blisters on the anterior part of his left leg. He has his compression pumps but is not yet been shown how to use some vital representative from the supplier. 09/02/16 patient returns today with no open wounds on the left leg. Some maceration in his plantar toes 09/10/2016 -- Dr. Leanord Hawking had recently discharged him on 09/02/2016 and he has come right back with redness swelling and some open ulcers on his left lower extremity. He says this was caused by trying to apply his compression stockings and he's been unable to use this and has not been able to use his lymphedema pumps. He had some doxycycline leftover and he has started on this a few days ago. 09/16/16; there are no open wounds on his leg on the left and no evidence of cellulitis. 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 He does not complain of symptoms here. I am not clear use using his external compression pumps. 09/23/16; I have not seen this patient in 2 weeks. He canceled his appointment 10 days ago as he was going on vacation. He tells me that on Monday he noticed a large area on his posterior left leg which is been draining copiously and is reopened into a large wound. He is been using ABDs and the external part of his juxtalite, according to our nurse this was not on properly. 10/07/16;  Still a substantial area on the posterior left leg. Using silver alginate 10/14/16; in general better although there is still open area which looks healthy. Still using silver alginate. He reminds me that this happen before he left for [facility].  T oday while he was showering in the morning. He had been using his juxtalite's 10/21/16; the area on his posterior left leg is fully epithelialized. However he arrives today with a large area of tender erythema in his medial and posterior left thigh just above the knee. I have marked the area. Once again he is reluctant to consider hospitalization. I treated him with oral antibiotics in the past for a similar situation with resolution I think with doxycycline however this area it seems more extensive to me. He is not complaining of fever but does have chills and says states he is thirsty. His blood sugar today was in the 140s at home 10/25/16 the area on his posterior left leg is fully epithelialized although there is still some weeping edema. The large area of tenderness and erythema in his medial and posterior left thigh is a lot less tender although there is still a lot of swelling in this thigh. He states he feels a lot better. He is on doxycycline and Augmentin that I started last week. This will continued until Tuesday, December 26. I have ordered a duplex ultrasound of the left thigh rule out DVT whether there is an abscess something that would need to be drained I would also like to know. 11/01/16; he still has weeping edema from a not fully epithelialized area on his left posterior calf. Most of the rest of this looks a lot better. He has completed his antibiotics. His thigh is a lot better. Duplex ultrasound did not show a DVT in the thigh 11/08/16; he comes in today with more Denuded surface epithelium from the posterior aspect of his calf. There is no real evidence of cellulitis. The superior aspect of his wrap appears to have put quite an  indentation in his leg just below the knee and this may have contributed. He does not complain of pain or fever. We have been using silver alginate as the primary dressing. The area of cellulitis in the right thigh has totally resolved. He has been using his compression stockings once a week 11/15/16; the patient arrives today with more loss of epithelium from the posterior aspect of his left calf. He now has a fairly substantial wound in this area. The reason behind this deterioration isn't exactly clear although his edema is not well controlled. He states he feels he is generally more swollen systemically. He is not complaining of chest pain shortness of breath fever. T me he has an appointment with his primary physician in early February. He is on 80 mg of oral ells Lasix a day. He claims compliance with the external compression pumps. He is not having any pain in his legs similar to what he has with his recurrent cellulitis 11/22/16; the patient arrives a follow-up of his large area on his left lateral calf. This looks somewhat better today. He came in earlier in the week for a dressing change since I saw him a week ago. He is not complaining of any pain no shortness of breath no chest pain 11/28/16; the patient arrives for follow-up of his large area on the left lateral calf this does not look better. In fact it is larger weeping edema. The surface of the wound does not look too bad. We have been using silver alginate although I'm not certain that this is a dressing issue. 12/05/16; again the patient follows up for a large wound on the left lateral and left posterior calf this does not look better. There continues  to be weeping edema necrotic surface tissue. More worrisome than this once again there is erythema below the wound involving the distal Achilles and heel suggestive of cellulitis. He is on his feet working most of the day of this is not going well. We are changing his dressing twice a week to  facilitate the drainage. 12/12/16; not much change in the overall dimensions of the large area on the left posterior calf. This is very inflamed looking. I gave him an. Doxycycline last week does not really seem to have helped. He found the wrap very painful indeed it seems to of dog into his legs superiorly and perhaps around the heel. He came in early today because the drainage had soaked through his dressings. 12/19/16- patient arrives for follow-up evaluation of his left lower extremity ulcers. He states that he is using his lymphedema pumps once daily when there is "no drainage". He admits to not using his lipedema pumps while under current treatment. His blood sugars have been consistently between 150-200. 12/26/16; the patient is not using his compression pumps at home because of the wetness on his feet. I've advised him that I think it's important for him to use this daily. He finds his feet too wet, he can put a plastic bag over his legs while he is in the pumps. Otherwise I think will be in a vicious circle. We are using silver alginate to the major area on his left posterior calf 01/02/17; the patient's posterior left leg has further of all into 3 open wounds. All of them covered with a necrotic surface. He claims to be using his compression pumps once a day. His edema control is marginal. Continue with silver alginate 01/10/17; the patient's left posterior leg actually looks somewhat better. There is less edema, less erythema. Still has 3 open areas covered with a necrotic surface requiring debridement. He claims to be using his compression pumps once a day his edema control is better 01/17/17; the patient's left posterior calf look better last week when I saw him and his wrap was changed 2 days ago. He has noted increasing pain in the left heel and arrives today with much larger wounds extensive erythema extending down into the entire heel area especially tender medially. He is not  systemically unwell CBGs have been controlled no fever. Our intake nurse showed me limegreen drainage on his AVD pads. 01/24/17; his usual this patient responds nicely to antibiotics last week giving him Levaquin for presumed Pseudomonas. The whole entire posterior part of his leg is much better much less inflamed and in the case of his Achilles heel area much less tender. He has also had some epithelialization posteriorly there are still open areas here and still draining but overall considerably better 01/31/17- He has continue to tolerate the compression wraps. he states that he continues to use the lymphedema pumps daily, and can increase to twice daily on the weekends. He is voicing no complaints or concerns regarding his LLE ulcers 02/07/17-he is here for follow-up evaluation. He states that he noted some erythema to the left medial and anterior thigh, which he states is new as of yesterday. He is concerned about recurrent cellulitis. He states his blood sugars have been slightly elevated, this morning in the 180s 02/14/17; he is here for follow-up evaluation. When he was last here there was erythema superiorly from his posterior wound in his anterior thigh. He was prescribed Levaquin however a culture of the wound surface grew MRSA over the  phone I changed him to doxycycline on Monday and things seem to be a lot better. 02/24/17; patient missed his appointment on Friday therefore we changed his nurse visit into a physician visit today. Still using silver alginate on the large area of the posterior left thigh. He isn't new area on the dorsal left second toe 03/03/17; actually better today although he admits he has not used his external compression pumps in the last 2 days or so because of work responsibilities over the weekend. 03/10/17; continued improvement. External compression pumps once a day almost all of his wounds have closed on the posterior left calf. Better edema control 03/17/17; in general  improved. He still has 3 small open areas on the lateral aspect of his left leg however most of the area on the posterior part of his leg is epithelialized. He has better edema control. He has an ABD pad under his stocking on the right anterior lower leg although he did not let us look at that today. 03/24/17; patient arrives back in clinic today with no open areas however there are areas on the posterior left calf and anterior left calf that are less than 100% epithelialized. His edema is well controlled in the left lower leg. There is some pitting edema probably lymphedema in the left upper thigh. He uses compression pumps at home once per day. I tried to get him to do this twice a day although he is very reticent. 04/01/2017 -- for the last 2 days he's had significant redness, tenderness and weeping and came in for an urgent visit today. 04/07/17; patient still has 6 more days of doxycycline. He was seen by Dr. Meyer Russel last Wednesday for cellulitis involving the posterior aspect, lateral aspect of his Involving his heel. For the most part he is better there is less erythema and less weeping. He has been on his feet for 12 hours 2 over the weekend. Using his compression pumps once a day 04/14/17 arrives today with continued improvement. Only one area on the posterior left calf that is not fully epithelialized. He has intense bilateral venous inflammation associated with his chronic venous insufficiency disease and secondary lymphedema. We have been using silver alginate to the left posterior calf wound In passing he tells [place] today that the right leg but we have not seen in quite some time has an open area on it but he doesn't want [place] to look at this today states he will show this to [place] next week. 04/21/17; there is no open area on his left leg although he still reports some weeping edema. He showed [place] his right leg today which is the first time we've seen this leg in a long time. He has a large area of  open wound on the right leg anteriorly healthy granulation. Quite a bit of swelling in the right leg and some degree of venous inflammation. He told us about the right leg in passing last week but states that deterioration in the right leg really only happened over the weekend 04/28/17; there is no open area on the left leg although there is an irritated part on the posterior which is like a wrap injury. The wound on the right leg which was new from last week at least to [place] is a lot better. 05/05/17; still no open area on the left leg. Patient is using his new compression stocking which seems to be doing a good job of controlling the edema. He states he is using his compression  pumps once per day. The right leg still has an open wound although it is better in terms of surface area. Required debridement. A lot of pain in the posterior right Achilles marked tenderness. Usually this type of presentation this patient gives concern for an active cellulitis 05/12/17; patient arrives today with his major wound from last week on the right lateral leg somewhat better. Still requiring debridement. He was using his compression stocking on the left leg however that is reopened with superficial wounds anteriorly he did not have an open wound on this leg previously. He is still using his juxta light's once daily at night. He cannot find the time to do this in the morning as he has to be at work by 7 AM 05/19/17; right lateral leg wound looks improved. No debridement required. The concerning area is on the left posterior leg which appears to almost have a subcutaneous hemorrhagic component to it. We've been using silver alginate to all the wounds 05/26/17; the right lateral leg wound continues to look improved. However the area on the left posterior calf is a tightly adherent surface. [place] using silver alginate. Because of the weeping edema in his legs there is very little good alternatives. 06/02/17; the patient left  here last week looking quite good. Major wound on the left posterior calf and a small one on the right lateral calf. Both of these look satisfactory. He tells me that by Wednesday he had noted increased pain in the left leg and drainage. He called on Thursday and Friday to get an appointment here but we were blocked. He did not go to urgent care or his primary physician. He thinks he had a fever on Thursday but did not actually take his temperature. He has not been using his compression pumps on the left leg because of pain. I advised him to go to the emergency room today for IV antibiotics for stents of left leg cellulitis but he has refused I have asked him to take 2 days off work to keep his leg elevated and he has refused this as well. In view of this I'm going to call him and Augmentin and doxycycline. 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 However after a detailed discussion regarding the risk benefits and alternatives he refuses to get admitted to the hospital. With no other recourse I will continue him on oral antibiotics as before and hopefully he'll have his infectious disease consultation this week. 06/16/2017 -- the patient was seen today by the nurse practitioner at infectious disease Ms. Dixon. 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 He has also been advise Lotrimin cream locally. she also had wise IV antibiotics for his condition if it flares up 06/23/17; patient arrives today with drainage bilaterally although the remaining wound on the left posterior calf after  cleaning up today "highlighter yellow drainage" did not look too bad. Unfortunately he has had breakdown on the right anterior leg [previously this leg had not been open and he is using a black stocking] he went to see infectious disease  and is been put on clindamycin 150 mg daily, I did not verify the dose although I'm not familiar with using clindamycin in this dosing range, perhaps for prophylaxisoo 06/27/17; I brought this patient back today to follow-up on the wound deterioration on the right lower leg together with surrounding cellulitis. I started him on doxycycline 4 days ago. This area looks better however he comes in today with intense cellulitis on the medial part of his left thigh. This is not have a wound in this area. Extremely tender. We've been using silver alginate to the wounds on the right lower leg left lower leg with bilateral 4 layer compression he is using his external compression pumps once a day 07/04/17; patient's left medial thigh cellulitis looks better. He has not been using his compression pumps as his insert said it was contraindicated with cellulitis. His right leg continues to make improvements all the wounds are still open. We only have one remaining wound on the left posterior calf. Using silver alginate to all open areas. He is on doxycycline which I started a week ago and should be finishing I gave him Augmentin after Thursday's visit for the severe cellulitis on the left medial thigh which fortunately looks better 07/14/17; the patient's left medial thigh cellulitis has resolved. The cellulitis in his right lower calf on the right also looks better. All of his wounds are stable to improved we've been using silver alginate he has completed the antibiotics I have given him. He has clindamycin 150 mg once a day prescribed by infectious disease for prophylaxis, I've advised him to start this now. We have been using bilateral Unna boots over silver alginate to the wound  areas 07/21/17; the patient is been to see infectious disease who noted his recurrent problems with cellulitis. He was not able to tolerate prophylactic clindamycin therefore he is on amoxicillin 500 twice a day. He also had a second daily dose of Lasix added By Dr. Oneta Rack but he is not taking this. Nor is he being completely compliant with his compression pumps a especially not this week. He has 2 remaining wounds one on the right posterior lateral lower leg and one on the left posterior medial lower leg. 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 The patient has Unna boots bilaterally. 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 He has bilateral Unna boots for compression and silver alginate to his wounds. Arrives today with his legs looking as good as I have seen him in quite some time. Not surprisingly his wounds look better as well with improvement on the right lateral leg venous insufficiency wound and also the left medial leg. He is still using the compression pumps once a day 08/11/17; both legs appear to be doing better wounds on the right lateral and left medial legs look better. Skin on the right leg quite good. He is been using silver alginate as the primary dressing. I'm going to use Anasept gel calcium alginate and maintain all the secondary dressings 08/18/17; the patient continues to actually do quite well. The area on his right lateral leg is just about closed the left medial also looks better although it is still moist in this area. His edema is well controlled we have been using Anasept gel with calcium alginate and  the usual secondary dressings, 4 layer compression and once daily use of his compression pumps "always been able to manage 09/01/17; the patient  continues to do reasonably well in spite of his trip to [place]. The area on the right lateral leg is epithelialized. Left is much better but still open. He has more edema and more chronic erythema on the left leg [venous inflammation] 09/08/17; he arrives today with no open wound on the right lateral leg and decently controlled edema. Unfortunately his left leg is not nearly as in his good situation as last week.he apparently had increasing edema starting on Saturday. He edema soaked through into his foot so used a plastic bag to walk around his home. The area on the medial right leg which was his open area is about the same however he has lost surface epithelium on the left lateral which is new and he has significant pain in the Achilles area of the left foot. He is already on amoxicillin chronically for prophylaxis of cellulitis in the left leg 09/15/17; he is completed a week of doxycycline and the cellulitis in the left posterior leg and Achilles area is as usual improved. He still has a lot of edema and fluid soaking through his dressings. There is no open wound on the right leg. He saw infectious disease NP today 09/22/17;As usual 1 we transition him from our compression wraps to his stockings things did not go well. He has several small open areas on the right leg. He states this was caused by the compression wrap on his skin although he did not wear this with the stockings over them. He has several superficial areas on the left leg medially laterally posteriorly. He does not have any evidence of active cellulitis especially involving the left Achilles The patient is traveling from [facility] Saturday going to [facility]. He states he isn't attempting to get an appointment with a heel objects wound center there to change his dressings. I am not completely certain whether this will work 10/06/17; the patient came in on Friday for a nurse visit and the nurse reported that his legs actually look  quite good. He arrives in clinic today for his regular follow-up visit. He has a new wound on his left third toe over the PIP probably caused by friction with his footwear. He has small areas on the left leg and a very superficial but epithelialized area on the right anterior lateral lower leg. Other than that his legs look as good as I've seen him in quite some time. We have been using silver alginate Review of systems; no chest pain no shortness of breath other than this a 10 point review of systems negative 10/20/17; seen by Dr. Meyer Russel last week. He had taken some antibiotics [doxycycline] that he had left over. Dr. Meyer Russel thought he had candida infection and declined to give him further antibiotics. He has a small wound remaining on the right lateral leg several areas on the left leg including a larger area on the left posterior several left medial and anterior and a small wound on the left lateral. The area on the left dorsal third toe looks a lot better. ROS; Gen.; no fever, respiratory no cough no sputum Cardiac no chest pain other than this 10 point review of system is negative 10/30/17; patient arrives today having fallen in the bathtub 3 days ago. It took him a while to get up. He has pain and maceration in the wounds on his left  leg which have deteriorated. He has not been using his pumps he also has some maceration on the right lateral leg. 11/03/17; patient continues to have weeping edema especially in the left leg. This saturates his dressings which were just put on on 12/27. As usual the doxycycline seems to take care of the cellulitis on his lower leg. He is not complaining of fever, chills, or other systemic symptoms. He states his leg feels a lot better on the doxycycline I gave him empirically. He also apparently gets injections at his primary doctor's officeo Rocephin for cellulitis prophylaxis. I didn't ask him about his compression pump compliance today I think that's probably  marginal. Arrives in the clinic with all of his dressings primary and secondary macerated full of fluid and he has bilateral edema 11/10/17; the patient's right leg looks some better although there is still a cluster of wounds on the right lateral. The left leg is inflamed with almost circumferential skin loss medially to laterally although we are still maintaining anteriorly. He does not have overt cellulitis there is a lot of drainage. He is not using compression pumps. We have been using silver alginate to the wound areas, there are not a lot of options here 11/17/17; the patient's right leg continues to be stable although there is still open wounds, better than last week. The inflammation in the left leg is better. Still loss of surface layer epithelium especially posteriorly. There is no overt cellulitis in the amount of edema and his left leg is really quite good, tells me he is using his compression pumps once a day. 11/24/17; patient's right leg has a small superficial wound laterally this continues to improve. The inflammation in the left leg is still improving however we have continuous surface layer epithelial loss posteriorly. There is no overt cellulitis in the amount of edema in both legs is really quite good. He states he is using his compression pumps on the left leg once a day for 5 out of 7 days 12/01/17; very small superficial areas on the right lateral leg continue to improve. Edema control in both legs is better today. He has continued loss of surface epithelialization and left posterior calf although I think this is better. We have been using silver alginate with large number of absorptive secondary dressings 4 layer on the left Unna boot on the right at his request. He tells me he is using his compression pumps once a day 12/08/17; he has no open area on the right leg is edema control is good here. On the left leg however he has marked erythema and tenderness breakdown of skin. He has  what appears to be a wrap injury just distal to the popliteal fossa. 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 There was swelling erythema and breakdown in the right leg. He has much worse skin breakdown in the left leg as well multiple open areas medially and posteriorly as well as laterally. He tells me he has been using his compression pumps but tells me he feels that the drainage out of his leg is worse when he uses a compression pumps. T be fair to him he is been saying this o for a while however I don't  know that I have really been listening to this. I wonder if the compression pumps are working properly 12/22/17;. Once again he arrives with severe erythema, weeping edema from the left greater than right leg. Noncompliance with compression pumps. New this visit he is complaining of pain on the lateral aspect of the right leg and the medial aspect of his right thigh. He apparently saw his cardiologist Dr. [name] who was ordered an echocardiogram area and I think this is a step in the right direction 12/25/17; started his doxycycline Monday night. There is still intense erythema of the right leg especially in the anterior thigh although there is less tenderness. The erythema around the wound on the right lateral calf also is less tender. He still complaining of pain in the left heel. His wounds are about the same right lateral left medial left lateral. Superficial but certainly not close to closure. He denies being systemically unwell no fever chills no abdominal pain no diarrhea 12/29/17; back in follow-up of his extensive right calf and right thigh cellulitis. I added amoxicillin to cover possible doxycycline resistant  strep. This seems to of done the trick he is in much less pain there is much less erythema and swelling. He has his echocardiogram at 11:00 this morning. X-ray of the left heel was also negative. 01/05/18; the patient arrived with his edema under much better control. Now that he is retired he is able to use his compression pumps daily and sometimes twice a day per the patient. He has a wound on the right leg the lateral wound looks better. Area on the left leg also looks a lot better. He has no evidence of cellulitis in his bilateral thighs I had a quick peak at his echocardiogram. He is in normal ejection fraction and normal left ventricular function. He has moderate pulmonary hypertension moderately reduced right ventricular function. One would have to wonder about chronic sleep apnea although he says he doesn't snore. He'll review the echocardiogram with his cardiologist. 01/12/18; the patient arrives with the edema in both legs under exemplary control. He is using his compression pumps daily and sometimes twice daily. His wound on the right lateral leg is just about closed. He still has some weeping areas on the posterior left calf and lateral left calf although everything is just about closed here as well. I have spoken with Aldean Baker who is the patient's nurse practitioner and infectious disease. She was concerned that the patient had not understood that the parenteral penicillin injections he was receiving for cellulitis prophylaxis was actually benefiting him. I don't think the patient actually saw that I would tend to agree we were certainly dealing with less infections although he had a serious one last month. 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 He is healed. He'll be placed in juxtalite compression wraps and increase his lymphedema pumps to twice daily. We will follow up again next week to ensure there are no issues with the new  regiment. 01/20/18-he is here for evaluation of bilateral lower extremity weeping edema. Yesterday he was placed in compression wrap to the right lower extremity and compression stocking to left lower shrubbery. He states he uses lymphedema pumps last night and again this morning and noted a blister to the left lower extremity. On exam he was noted to have drainage to the right lower extremity. He will be placed in Unna boots bilaterally and follow-up next week 01/26/18; patient was actually discharged a week  ago to his own juxta light stockings only to return the next day with bilateral lower extremity weeping edema.he was placed in bilateral Unna boots. He arrives today with pain in the back of his left leg. There is no open area on the right leg however there is a linear/wrap injury on the left leg and weeping edema on the left leg posteriorly. I spoke with infectious disease about 10 days ago. They were disappointed that the patient elected to discontinue prophylactic intramuscular penicillin shots as they felt it was particularly beneficial in reducing the frequency of his cellulitis. I discussed this with the patient today. He does not share this view. He'll definitely need antibiotics today. Finally [place] and returning a week later and he does not travel with his pumps. He is going by car 01/30/18; patient was seen 4 days ago and brought back in today for review of cellulitis in the left leg posteriorly. I put him on amoxicillin this really hasn't helped as much as I might like. He is also worried because [place]. Finally we will be rewrapping him. There is no open area on the right leg over his left leg has multiple weeping areas as usual 02/09/18; The same wrap on for 10 days. He did not pick up the last doxycycline I prescribed for him. He apparently took 4 days worth he already had. There is nothing open on  his right leg and the edema control is really quite good. He's had damage in the left leg medially and laterally especially probably related to the prolonged use of Unna boots 02/12/18; the patient arrived in clinic today for a nurse visit/wrap change. He complained of a lot of pain in the left posterior calf. He is taking doxycycline that I previously prescribed for him. Unfortunately even though he used his stockings and apparently used to compression pumps twice a day he has weeping edema coming out of the lateral part of his right leg. This is coming from the lower anterior lateral skin area. 02/16/18; the patient has finished his doxycycline and will finish the amoxicillin 2 days. The area of cellulitis in the left calf posteriorly has resolved. He is no longer having any pain. He tells me he is using his compression pumps at least once a day sometimes twice. 02/23/18; the patient finished his doxycycline and Amoxil last week. On Friday he noticed a small erythematous circle about the size of a quarter on the left lower leg just above his ankle. This rapidly expanded and he now has erythema on the lateral and posterior part of the thigh. This is bright red. Also has an area on the dorsal foot just above his toes and a tender area just below the left popliteal fossa. He came off his prophylactic penicillin injections at his own insistence one or 2 months ago. This is obviously deteriorated since then 03/02/18; patient is on doxycycline and Amoxil. Culture I did last week of the weeping area on the back of his left calf grew group B strep. I have therefore renewed the amoxicillin 500 3 times a day for a further week. He has not been systemically unwell. Still complaining of an area of discomfort right under his left popliteal fossa. There is no open wound on the right leg. He tells me that he is using his pumps twice a day on most days 03/09/18; patient arrives in clinic today completing his  amoxicillin  today. The cellulitis on his left leg is better. Furthermore he tells me that he had intramuscular penicillin shots that his primary care office today. However he also states that the wrap on his right leg fell down shortly after leaving clinic last week. He developed a large blister that was present when he came in for a nurse visit later in the week and then he developed intense discomfort around this area.He tells me he is using his compression pumps 03/16/18; the patient has completed his doxycycline. The infectious part of this/cellulitis in the left heel area left popliteal area is a lot better. He has 2 open areas on the right calf. Still areas on the left calf but this is a lot better as well. 03/24/18; the patient arrives complaining of pain in the left popliteal area again. He thinks some of this is wrap injury. He has no open area on the right leg and really no open area on the left calf either except for the popliteal area. He claims to be compliant with the compression pumps 03/31/18; I gave him doxycycline last week because of cellulitis in the left popliteal area. This is a lot better although the surface epithelium is denuded off and response to this. He arrives today with uncontrolled edema in the right calf area as well as a fingernail injury in the right lateral calf. There is only a few open areas on the left 04/06/18; I gave him amoxicillin doxycycline over the last 2 weeks that the amoxicillin should be completing currently. He is not complaining of any pain or systemic symptoms. The only open areas see has is on the right lateral lower leg paradoxically I cannot see anything on the left lower leg. He tells me he is using his compression pumps twice a day on most days. Silver alginate to the wounds that are open under 4 layer compression 04/13/18; he completed antibiotics and has no new complaints. Using his compression pumps. Silver alginate that anything that's opened 04/20/18; he is  using his compression pumps religiously. Silver alginate 4 layer compression anything that's opened. He comes in today with no open wounds on the left leg but 3 on the right including a new one posteriorly. He has 2 on the right lateral and one on the right posterior. He likes Unna boots on the right leg for reasons that aren't really clear we had the usual 4 layer compression on the left. It may be necessary to move to the 4 layer compression on the right however for now I left them in the Unna boots 04/27/18; he is using his compression pumps at least once a day. He has still the wounds on the right lateral calf. The area right posteriorly has closed. He does not have an open wound on the left under 4 layer compression however on the dorsal left foot just proximal to the toes and the left third toe 2 small open areas were identified 05/11/18; he has not uses compression pumps. The areas on the right lateral calf have coalesced into one large wound necrotic surface. On the left side he has one small wound anteriorly however the edema is now weeping out of a large part of his left leg. He says he wasn't using his pumps because of the weeping fluid. I explained to him that this is the time he needs to pump more 05/18/18; patient states he is using his compression pumps twice a day. The area on the right lateral large  wound albeit superficial. On the left side he has innumerable number of small new wounds on the left calf particularly laterally but several anteriorly and medially. All these appear to have healthy granulated base these look like the remnants of blisters however they occurred under compression. The patient arrives in clinic today with his legs somewhat better. There is certainly less edema, less multiple open areas on the left calf and the right anterior leg looks somewhat better as well superficial and a little smaller. However he relates pain and erythema over the last 3-4 days in the thigh  and I looked at this today. He has not been systemically unwell no fever no chills no change in blood sugar values 05/25/18; comes in today in a better state. The severe cellulitis on his left leg seems better with the Keflex. Not as tender. He has not been systemically unwell Hard to find an open wound on the left lower leg using his compression pumps twice a day The confluent wounds on his right lateral calf somewhat better looking. These will ultimately need debridement I didn't do this today. 06/01/18; the severe cellulitis on the left anterior thigh has resolved and he is completed his Keflex. There is no open wound on the left leg however there is a superficial excoriation at the base of the third toe dorsally. Skin on the bottom of his left foot is macerated looking. The left the wounds on the lateral right leg actually looks some better although he did require debridement of the top half of this wound area with an open curet 06/09/18 on evaluation today patient appears to be doing poorly in regard to his right lower extremity in particular this appears to likely be infected he has very thick purulent discharge along with a bright green tent to the discharge. This makes me concerned about the possibility of pseudomonas. He's also having increased discomfort at this point on evaluation. Fortunately there does not appear to be any evidence of infection spreading to the other location at this time. 06/16/18 on evaluation today patient appears to actually be doing fairly well. His ulcer has actually diminished in size quite significantly at this point which is good news. Nonetheless he still does have some evidence of infection he did see infectious disease this morning before coming here for his appointment. I did review the results of their evaluation and their note today. They did actually have him discontinue the Cipro and initiate treatment with linezolid at this time. He is doing this for the  next seven days and they recommended a follow-up in four months with them. He is the keep a log of the need for intermittent antibiotic therapy between now and when he falls back up with infectious disease. This will help them gaze what exactly they need to do to try and help them out. 06/23/18; the patient arrives today with no open wounds on the left leg and left third toe healed. He is been using his compression pumps twice a day. On the right lateral leg he still has a sizable wound but this is a lot better than last time I saw this. 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 Has been using silver alginate under 4 layer compression 06/30/18; the only open wound he has is on the right lateral leg and this looks healthy. No debridement is required. We have been using silver alginate.  He does not have an open wound on the left leg. There is apparently some drainage from the dorsal proximal third toe on the left although I see no open wound here. 07/03/18 on evaluation today patient was actually here just for a nurse visit rapid change. However when he was here on Wednesday for his rat change due to having been healed on the left and then developing blisters we initiated the wrap again knowing that he would be back today for [place] to reevaluate and see were at. Unfortunately he has developed some cellulitis into the proximal portion of his right lower extremity even into the region of his thigh. 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 He was placed on one as what at that point. Nonetheless he is done with that and has been tolerating it well otherwise. Doxycycline which in the past really did not seem to be effective for him. Nonetheless I think the best option may be for [place] to definitely reinitiate the antibiotics for a longer period of time. 07/07/18; since I  last saw this patient a week ago he has had a difficult time. At that point he did not have an open wound on his left leg. We transitioned him into juxta light stockings. He was apparently in the clinic the next day with blisters on the left lateral and left medial lower calf. He also had weeping edema fluid. He was put back into a compression wrap. He was also in the clinic on Friday with intense erythema in his right thigh. Per the patient he was started on Bactrim however that didn't work at all in terms of relieving his pain and swelling. He has taken 3 doxycycline that he had left over from last time and that seems to of helped. He has blistering on the right thigh as well. 07/14/18; the erythema on his right thigh has gotten better with doxycycline that he is finishing. 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 Presumably this cellulitis in the thigh was not related to that although I think this is a bit concerning going forward. He still has an area on the right lateral calf the blister on the right medial calf just below the knee that was discussed above. On the left 2 small open areas left medial and left lateral. Edema control is adequate. He is using his compression pumps twice a day 07/20/18; continued improvement in the condition of both legs especially the edema in his bilateral thighs. He tells me he is been losing weight through a combination of diet and exercise. He is using his compression pumps twice a day. So overall she made to the remaining wounds 07/27/2018; continued improvement in condition of both legs. His edema is well controlled. The area on the right lateral leg is just about closed he had one blisters show up on the medial left upper calf. We have him in 4 layer compression. He is going on a 10-day trip to [place]. He will be driving. He wants to wear Unna boots because of  the lessening amount of constriction. He will not use compression pumps while he is away 08/05/18 on evaluation today patient actually appears to be doing decently well all things considered in regard to his bilateral lower extremities. The worst ulcer is actually only posterior aspect of his left lower extremity  with a four layer compression wrap cut into his leg a couple weeks back. He did have a trip and actually had [employer] for the trip that he is worn since he was last here. Nonetheless he feels like the [employer] actually do better for him his swelling is up a little bit but he also with his trip was not taking his Lasix on a regular set schedule like he was supposed to be. He states that obviously the reason being that he cannot drive and keep going without having to urinate too frequently which makes it difficult. He did not have his pumps with him while he was away either which I think also maybe playing a role here too. 08/13/2018; the patient only has a small open wound on the right lateral calf which is a big improvement in the last month or 2. He also has the area posteriorly just below the posterior fossa on the left which I think was a wrap injury from several weeks ago. He has no current evidence of cellulitis. He tells me he is back into his compression pumps twice a day. He also tells me that while he was at the laundromat somebody stole a section of his extremitease stockings 08/20/2018; back in the clinic with a much improved state. He only has small areas on the right lateral mid calf which is just about healed. This was is more substantial area for quite a prolonged period of time. He has a small open area on the left anterior tibia. The area on the posterior calf just below the popliteal fossa is closed today. He is using his compression pumps twice a day 08/28/2018; patient has no open wound on the right leg. He has a smattering of open areas on the calf with some  weeping lymphedema. More problematically than that it looks as though his wraps of slipped down in his usual he has very angry upper area of edema just below the right medial knee and on the right lateral calf. He has no open area on his feet. The patient is traveling to [facility] next week. I will send him in an antibiotic. We will continue to wrap the right leg. We ordered extremitease stockings for him last week and I plan to transition the right leg to a stocking when he gets home which will be in 10 days time. As usual he is very reluctant to take his pumps with him when he travels 09/07/2018; patient returns from [facility]. He shows me a picture of his left leg in the mid part of his trip last week with intense fire engine erythema. The picture look bad enough I would have considered sending him to the hospital. Instead he went to the wound care center in [facility]. They did not prescribe him antibiotics but he did take some doxycycline he had leftover from a previous visit. I had given him trimethoprim sulfamethoxazole before he left this did not work according to the patient. This is resulted in some improvement fortunately. He comes back with a large wound on the left posterior calf. Smaller area on the left anterior tibia. Denuded blisters on the dorsal left foot over his toes. Does not have much in the way of wounds on the right leg although he does have a very tender area on the right posterior area just below the popliteal fossa also suggestive of infection. He promises me he is back on his pumps twice a day  09/15/2018; the intense cellulitis in his left lower calf is a lot better. The wound area on the posterior left calf is also so better. However he has reasonably extensive wounds on the dorsal aspect of his second and third toes and the proximal foot just at the base of the toes. There is nothing open on the right leg 09/22/2018; the patient has excellent edema  control in his legs bilaterally. He is using his external compression pumps twice a day. He has no open area on the right leg and only the areas in the left foot dorsally second and third toe area on the left side. He does not have any signs of active cellulitis. 10/06/2018; the patient has good edema control bilaterally. He has no open wound on the right leg. There is a blister in the posterior aspect of his left calf that we had to deal with today. He is using his compression pumps twice a day. There is no signs of active cellulitis. We have been using silver alginate to the wound areas. He still has vulnerable areas on the base of his left first second toes dorsally He has a his extremities stockings and we are going to transition him today into the stocking on the right leg. He is cautioned that he will need to continue to use the compression pumps twice a day. If he notices uncontrolled edema in the right leg he may need to go to 3 times a day. 10/13/2018; the patient came in for a nurse check on Friday he has a large flaccid blister on the right medial calf just below the knee. We unroofed this. He has this and a new area underneath the posterior mid calf which was undoubtedly a blister as well. He also has several small areas on the right which is the area we put his extremities stocking on. 10/19/2018; the patient went to see infectious disease this morning I am not sure if that was a routine follow-up in any case the doxycycline I had given him was discontinued and started on linezolid. He has not started this. It is easy to look at his left calf and the inflammation and think this is cellulitis however he is very tender in the tissue just below the popliteal fossa and I have no doubt that there is infection going on here. He states the problem he is having is that with the compression pumps the edema goes down and then starts walking the wrap falls down. We will see if we can adhere this. He  has 1 or 2 minuscule open areas on the right still areas that are weeping on the posterior left calf, the base of his left second and third toes 10/26/18; back today in clinic with quite of skin breakdown in his left anterior leg. This may have been infection the area below the popliteal fossa seems a lot better however tremendous epithelial loss on the left anterior mid tibia area over quite inexpensive tissue. He has 2 blisters on the right side but no other open wound here. 10/29/2018; came in urgently to see [place] today and we worked him in for review. He states that the 4 layer compression on the right leg caused pain he had to cut it down to roughly his mid calf this caused swelling above the wrap and he has blisters and skin breakdown today. As a result of the pain he has not been using his pumps. Both legs are a lot more edematous and there is  a lot of weeping fluid. 11/02/18; arrives in clinic with continued difficulties in the right leg> left. Leg is swollen and painful. multiple skin blisters and new open areas especially laterally. He has not been using his pumps on the right leg. 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 He is not systemically unwell. 11/09/2018; the patient claims he is being compliant with his pumps. He is finished the doxycycline I gave him last week. Culture I did of the wound on the right lateral leg showed a few very resistant methicillin staph aureus. This was resistant to doxycycline. Nevertheless he states the pain in the leg is a lot better which makes me wonder if the cultured organism was not really what was causing the problem nevertheless this is a very dangerous organism to be culturing out of any wound. His right leg is still a lot larger than the left. He is using an Radio broadcast assistant on this area, he blames a 4-layer compression for causing the original skin breakdown which I doubt is true however I cannot talk him out  of it. We have been using silver alginate to all of these areas which were initially blisters 11/16/2018; patient is being compliant with his external compression pumps at twice a day. Miraculously he arrives in clinic today with absolutely no open wounds. He has better edema control on the left where he has been using 4 layer compression versus wound of wounds on the right and I pointed this out to him. There is no inflammation in the skin in his lower legs which is also somewhat unusual for him. There is no open wounds on the dorsal left foot. He has extremitease stockings at home and I have asked him to bring these in next week. 11/25/18 patient's lower extremity on examination today on the left appears for the most part to be wound free. He does have an open wound on the lateral aspect of the right lower extremity but this is minimal compared to what I've seen in past. He does request that we go ahead and wrap the left leg as well even though there's nothing open just so hopefully it will not reopen in short order. 1/28; patient has superficial open wounds on the right lateral calf left anterior calf and left posterior calf. His edema control is adequate. 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 We have been using silver alginate as the primary dressing. He claims compliance with his compression pumps 2/4; patient has superficial open wounds on numerous areas of his left calf and again one on the left dorsal foot. The areas on the right lateral calf have healed. The cellulitis that I gave him doxycycline for last week is also resolved this was mostly on the left anterior calf just below the tibial tuberosity. His edema looks fairly well-controlled. He tells me he went to see his primary doctor today and had blood work ordered 2/11; once again he has several open areas on the left calf left tibial area. Most of  these are small and appear to have healthy granulation. He does not have anything open on the right. The edema and control in his thighs is pretty good which is usually a good indication he has been using his pumps as requested. 2/18; he continues to have several small areas on the left calf and left tibial area. Most of these  are small healthy granulation. We put him in his stocking on the right leg last week and he arrives with a superficial open area over the right upper tibia and a fairly large area on the right lateral tibia in similar condition. His edema control actually does not look too bad, he claims to be using his compression pumps twice a day 2/25. Continued small areas on the left calf and left tibial area. New areas especially on the right are identified just below the tibial tuberosity and on the right upper tibia itself. There are also areas of weeping edema fluid even without an obvious wound. He does not have a considerable degree of lymphedema but clearly there is more edema here than his skin can handle. He states he is using the pumps twice a day. We have an Unna boot on the right and 4 layer compression on the left. 3/3; he continues to have an area on the right lateral calf and right posterior calf just below the popliteal fossa. There is a fair amount of tenderness around the wound on the popliteal fossa but I did not see any evidence of cellulitis, could just be that the wrap came down and rubbed in this area. He does not have an open area on the left leg however there is an area on the left dorsal foot at the base of the third toe We have been using silver alginate to all wound areas 3/10; he did not have an open area on his left leg last time he was here a week ago. T oday he arrives with a horizontal wound just below the tibial tuberosity and an area on the left lateral calf. He has intense erythema and tenderness in this area. The area is on the right lateral calf and  right posterior calf better than last week. We have been using silver alginate as usual 3/18 - Patient returns with 3 small open areas on left calf, and 1 small open area on right calf, the skin looks ok with no significant erythema, he continues the UNA boot on right and 4 layer compression on left. The right lateral calf wound is closed , the right posterior is small area. we will continue silver alginate to the areas. 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 He is been tolerating the dressing changes without complication. Fortunately she has made excellent improvement in regard to the overall status of his wounds. Unfortunately every time we cease wrapping him he ends up reopening in causing more significant issues at that point. Again I'm unsure of the best direction to take although I think the lymphedema clinic may be appropriate for him. 02/03/19 on evaluation today patient appears to be doing well in regard to the wounds that we saw him for last week unfortunately he has a new area on the proximal portion of his right medial/posterior lower extremity where the wrap somewhat slowed down and caused swelling and a blister to rub and open. Unfortunately this is the only opening that he has on either leg at this point. 02/17/19 on evaluation today patient's bilateral lower extremities appear to be doing well. He still completely healed in regard to the left lower extremity. In regard to the right lower extremity the area where the wrap and slid down and caused the blister  still seems to be slightly open although this is dramatically better than during the last evaluation two weeks ago. I'm very pleased with the way this stands overall. 03/03/19 on evaluation today patient appears to be doing well in regard  to his right lower extremity in general although he did have a new blister open this does not appear to be showing any evidence of active infection at this time. Fortunately there's No fevers, chills, nausea, or vomiting noted at this time. Overall I feel like he is making good progress it does feel like that the right leg will we perform the [employer] seems to do with a bit better than three layer wrap on the left which slid down on him. We may switch to doing bilateral in the book wraps. 5/4; I have not seen [name] in quite some time. According to our case manager he did not have an open wound on his left leg last week. He had 1 remaining wound on the right posterior medial calf. He arrives today with multiple openings on the left leg probably were blisters and/or wrap injuries from Unna boots. I do not think the Unna boot's will provide adequate compression on the left. I am also not clear about the frequency he is using the compression pumps. 03/17/19 on evaluation today patient appears to be doing excellent in regard to his lower extremities compared to last week's evaluation apparently. He had gotten significantly worse last week which is unfortunate. The [employer] wrap on the left did not seem to do very well for him at all and in fact it didn't control his swelling significantly enough he had an additional outbreak. Subsequently we go back to the four layer compression wrap on the left. This is good news. At least in that he is doing better and the wound seem to be killing him. He still has not heard anything from the lymphedema clinic. 03/24/19 on evaluation today patient actually appears to be doing much better in regard to his bilateral lower Trinity as compared to last week when I saw him. Fortunately there's no signs of active infection at this time. He has been tolerating the dressing changes without complication. Overall I'm extremely pleased with the progress and appearance in  general. 04/07/19 on evaluation today patient appears to be doing well in regard to his bilateral lower extremities. His swelling is significantly down from where it was previous. With that being said he does have a couple blisters still open at this point but fortunately nothing that seems to be too severe and again the majority of the larger openings has healed at this time. 04/14/19 on evaluation today patient actually appears to be doing quite well in regard to his bilateral lower extremities in fact I'm not even sure there's anything significantly open at this time at any site. Nonetheless he did have some trouble with these wraps where they are somewhat irritating him secondary to the fact that he has noted that the graph wasn't too close down to the end of this foot in a little bit short as well up to his knee. Otherwise things seem to be doing quite well. 04/21/19 upon evaluation today patient's wound bed actually showed evidence of being completely healed in regard to both lower extremities which is excellent news. There does not appear to be any signs of active infection which is also good news. I'm very pleased in this regard. No fevers, chills, nausea, or vomiting noted at this time. 04/28/19  on evaluation today patient appears to be doing a little bit worse in regard to both lower extremities on the left mainly due to the fact that when he went infection disease the wrap was not wrapped quite high enough he developed a blister above this. On the right he is a small open area of nothing too significant but again this is continuing to give him some trouble he has been were in the Velcro compression that he has at home. 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 He's been tolerating the dressing changes without complication. Fortunately there's no signs of active infection at this time. No fevers, chills, nausea, or vomiting noted at  this time. We have been trying to get an appointment with her lymphedema clinic in [facility] but unfortunately nobody can get them on phone with not been able to even fax information over the patient likewise is not been able to get in touch with them. Overall I'm not sure exactly what's going on here with to reach out again today. 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 Still having a lot of drainage unfortunately. He tells me especially in the left but even on the right. There's no signs of active infection which is good news we've been using so ratcheted up to this point. 05/19/19 on evaluation today patient actually appears to be doing quite well with regard to his left lower extremity which is great news. Fortunately in regard to the right lower extremity has an issues with his wrap and he subsequently did remove this from what I'm understanding. Nonetheless long story short is what he had rewrapped once he removed it subsequently had maggots underneath this wrap whenever he came in for evaluation today. With that being said they were obviously completely cleaned away by the nursing staff. The visit today which is excellent news. However he does appear to potentially have some infection around the right ankle region where the maggots were located as well. He will likely require anabiotic therapy today. 05/26/19 on evaluation today patient actually appears to be doing much better in regard to his bilateral lower extremities. I feel like the infection is under much better control. With that being said there were maggots noted when the wrap was removed yet again today. Again this could have potentially been left over from previous although at this time there does not appear to be any signs of significant drainage there was obviously on the wrap some drainage as well this contracted gnats or  otherwise. Either way I do not see anything that appears to be doing worse in my pinion and in fact I think his drainage has slowed down quite significantly likely mainly due to the fact to his infection being under better control. 06/02/2019 on evaluation today patient actually appears to be doing well with regard to his bilateral lower extremities there is no signs of active infection at this time which is great news. With that being said he does have several open areas more so on the right than the left but nonetheless these are all significantly better than previously noted. 06/09/2019 on evaluation today patient actually appears to be doing well. His wrap stayed up and he did not cause any problems he had more drainage on the right compared to the left but overall I do not see any major issues at  this time which is great news. 06/16/2019 on evaluation today patient appears to be doing excellent with regard to his lower extremities the only area that is open is a new blister that can have opened as of today on the medial ankle on the left. Other than this he really seems to be doing great I see no major issues at this point. 06/23/2019 on evaluation today patient appears to be doing quite well with regard to his bilateral lower extremities. In fact he actually appears to be almost completely healed there is a small area of weeping noted of the right lower extremity just above the ankle. Nonetheless fortunately there is no signs of active infection at this time which is good news. No fevers, chills, nausea, vomiting, or diarrhea. 8/24; the patient arrived for a nurse visit today but complained of very significant pain in the left leg and therefore I was asked to look at this. Noted that he did not have an open area on the left leg last week nevertheless this was wrapped. The patient states that he is not been able to put his compression pumps on the left leg because of the discomfort. He has not been  systemically unwell 06/30/2019 on evaluation today patient unfortunately despite being excellent last week is doing much worse with regard to his left lower extremity today. In fact he had to come in for a nurse on Monday where his left leg had to be rewrapped due to excessive weeping Dr. Leanord Hawking placed him on doxycycline at that point. Fortunately there is no signs of active infection Systemically at this time which is good news. 07/07/2019 in regard to the patient's wounds today he actually seems to be doing well with his right lower extremity there really is nothing open or draining at this point this is great news. Unfortunately the left lower extremity is given him additional trouble at this time. There does not appear to be any signs of active infection nonetheless he does have a lot of edema and swelling noted at this point as well as blistering all of which has led to a much more poor appearing leg at this time compared to where it was 2 weeks ago when it was almost completely healed. Obviously this is a little discouraging for the patient. He is try to contact the lymphedema clinic in [place] he has not been able to get through to them. 07/14/2019 on evaluation today patient actually appears to be doing slightly better with regard to his left lower extremity ulcers. Overall I do feel like at least at the top of the wrap that we have been placing this area has healed quite nicely and looks much better. The remainder of the leg is showing signs of improvement. Unfortunately in the thigh area he still has an open region on the left and again on the right he has been utilizing just a Band-Aid on an area that also opened on the thigh. Again this is an area that were not able to wrap although we did do an Ace wrap to provide some compression that something that obviously is a little less effective than the compression wraps we have been using on the lower portion of the leg. He does have an  appointment with the lymphedema clinic in [facility] on Friday. 07/21/2019 on evaluation today patient appears to be doing better with regard to his lower extremity ulcers. He has been tolerating the dressing changes without complication. Fortunately there is no signs of active  infection at this time. No fevers, chills, nausea, vomiting, or diarrhea. I did receive the paperwork from the physical therapist at the lymphedema clinic in [place]. Subsequently I signed off on that this morning and sent that back to him for further progression with the treatment plan. 07/28/2019 on evaluation today patient appears to be doing very well with regard to his right lower extremity where I do not see any open wounds at this point. Fortunately he is feeling great as far as that is concerned as well. In regard to the left lower extremity he has been having issues with still several areas of weeping and edema although the upper leg is doing better his lower leg still I think is going require the compression wrap at this time. No fevers, chills, nausea, vomiting, or diarrhea. 08/04/2019 on evaluation today patient unfortunately is having new wounds on the right lower extremity. Again we have been using Unna boot wrap on that side. We switched him to using his juxta lite wrap at home. With that being said he tells me he has been using it although his legs extremely swollen and to be honest really does not appear that he has been. I cannot know that for sure however. Nonetheless he has multiple new wounds on the right lower extremity at this time. Obviously we will have to see about getting this rewrapped for him today. 08/11/2019 on evaluation today patient appears to be doing fairly well with regard to his wounds. He has been tolerating the dressing changes including the compression wraps without complication. He still has a lot of edema in his upper thigh regions bilaterally he is supposed to be seeing the  lymphedema clinic on the 15th of this month once his wraps arrive for the upper part of his legs. 08/18/2019 on evaluation today patient appears to be doing well with regard to his bilateral lower extremities at this point. He has been tolerating the dressing changes without complication. Fortunately there is no signs of active infection which is also good news. He does have a couple weeping areas on the first and second toe of the right foot he also has just a small area on the left foot upper leg and a small area on the left lower leg but overall he is doing quite well in my opinion. He is supposed to be getting his wraps shortly in fact tomorrow and then subsequently is seeing the lymphedema clinic next Wednesday on the 21st. Of note he is also leaving on the 25th to go on vacation for a [place] to the beach. For that reason and since there is some uncertainty about what there can be doing at lymphedema clinic next Wednesday I am get a make an appointment for next Friday here for [place] to see what we need to do for him prior to him leaving for vacation. 10/23; patient arrives in considerable pain predominantly in the upper posterior calf just distal to the popliteal fossa also in the wound anteriorly above the major wound. This is probably cellulitis and he has had this recurrently in the past. He has no open wound on the right side and he has had an Radio broadcast assistant in that area. Finally I note that he has an area on the left posterior calf which by enlarge is mostly epithelialized. This protrudes beyond the borders of the surrounding skin in the setting of dry scaly skin and lymphedema. The patient is leaving for [facility] on Sunday. Per his longstanding pattern, he  will not take his compression pumps with him predominantly out of fear that they will be stolen. He therefore asked that we put a Unna boot back on the right leg. He [place] also [place] if [place] can  change his dressing in the mid week. 11/3; [place] He was seen on 1 occasion at their wound care center. They did a 2 layer compression system as they did not have our 4-layer wrap. I am not completely certain what they put on the wounds. They did not change the Unna boot on the right. The patient is also seeing a lymphedema specialist [place]. It appears that he has some compression sleeve for his thighs which indeed look quite a bit better than I am used to seeing. He pumps over these with his external compression pumps. 11/10; the patient has a new wound on the right medial thigh otherwise there is no open areas on the right. He has an area on the left leg posteriorly anteriorly and medially and an area over the left second toe. We have been using silver alginate. He thinks the injury on his thigh is secondary to friction from the compression sleeve he has. 11/17; the patient has a new wound on the right medial thigh last week. He thinks this is because he did not have a underlying stocking for his thigh juxta lite apparatus. He now has this. The area is fairly large and somewhat angry but I do not think he has underlying cellulitis. He has a intact blister on the right anterior tibial area. Small wound on the right great toe dorsally Small area on the medial left calf. 11/30; the patient does not have any open areas on his right leg and we did not take his juxta lite stocking off. However he states that on Friday his compression wrap fell down lodging around his upper mid calf area. As usual this creates a lot of problems for him. He called urgently today to be seen for a nurse visit however the nurse visit turned into a provider visit because of extreme erythema and pain in the left anterior tibia extending laterally and posteriorly. The area that is problematic is extensive 10/06/2019 upon evaluation today patient actually  appears to be doing poorly in regard to his left lower extremity. He Dr. Leanord Hawkingobson did place him on doxycycline this past Monday apparently due to the fact that he was doing much worse in regard to this left leg. Fortunately the doxycycline does seem to be helping. Unfortunately we are still having a very difficult time getting his edema under any type of control in order to anticipate discharge at some point. The only way were really able to control his lymphedema really is with compression wraps and that has only even seemingly temporary. He has been seeing a lymphedema clinic they are trying to help in this regard but still this has been somewhat frustrating in general for the patient. 10/13/19 on evaluation today patient appears to be doing excellent with regard to his right lower extremity as far as the wounds are concerned. His swelling is still quite extensive unfortunately. He is still having a lot of drainage from the thigh areas bilaterally which is unfortunate. He's been going to lymphedema clinic but again he still really does not have this edema under control as far as his lower extremities are concern. With regard to his left lower  extremity this seems to be improving and I do believe the doxycycline has been of benefit for him. He is about to complete the doxycycline. 10/20/2019 on evaluation today patient appears to be doing poorly in regard to his bilateral lower extremities. More in the right thigh he has a lot of irritation at this site unfortunately. In regard to the left lower extremity the wrap was not quite as high it appears and does seem to have caused him some trouble as well. Fortunately there is no evidence of systemic infection though he does have some blue-green drainage which has me concerned for the possibility of Pseudomonas. He tells me he is previously taking Cipro without complications and he really does not care for Levaquin however due to some of the side effects he  has. He is not allergic to any medications specifically antibiotics that were aware of. 10/27/2019 on evaluation today patient actually does appear to be for the most part doing better when compared to last week's evaluation. With that being said he still has multiple open wounds over the bilateral lower extremities. He actually forgot to start taking the Cipro and states that he still has the whole bottle. He does have several new blisters on left lower extremity today I think I would recommend he go ahead and take the Cipro based on what I am seeing at this point. 12/30-Patient comes at 1 week visit, 4 layer compression wraps on the left and Unna boot on the right, primary dressing Xtrasorb and silver alginate. Patient is taking his Cipro and has a few more days left probably 5-6, and the legs are doing better. He states he is using his compressions devices which I believe he has 11/10/2019 on evaluation today patient actually appears to be much better than last time I saw him 2 weeks ago. His wounds are significantly improved and overall I am very pleased in this regard. Fortunately there is no signs of active infection at this time. He is just a couple of days away from completing Cipro. Overall his edema is much better he has been using his lymphedema pumps which I think is also helping at this point. 11/17/2019 on evaluation today patient appears to be doing excellent in regard to his wounds in general. His legs are swollen but not nearly as much as they have been in the past. Fortunately he is tolerating the compression wraps without complication. No fevers, chills, nausea, vomiting, or diarrhea. He does have some erythema however in the distal portion of his right lower extremity specifically around the forefoot and toes there is a little bit of warmth here as well. 11/24/2019 on evaluation today patient appears to be doing well with regard to his right lower extremity I really do not see any open  wounds at this point. His left lower extremity does have several open areas and his right medial thigh also is open. Other than this however overall the patient seems to be making good progress and I am very pleased at this point. 12/01/2019 on evaluation today patient appears to be doing poorly at this point in regard to his left lower extremity has several new blisters despite the fact that we have him in compression wraps. In fact he had a 4-layer compression wrap, his upper thigh wrapped from lymphedema clinic, and a juxta light over top of the 4 layer compression wrap the lymphedema clinic applied and despite all this he still develop blisters underneath. Obviously this does have me concerned about  the fact that unfortunately despite what we are doing to try to get wounds healed he continues to have new areas arise I do not think he is ever good to be at the point where he can realistically just use wraps at home to keep things under control. Typically when we heal him it takes about 1-2 days before he is back in the clinic with severe breakdown and blistering of his lower extremities bilaterally. This is happened numerous times in the past. Unfortunately I think that we may need some help as far as overall fluid overload to kind of limit what we are seeing and get things under better control. 12/08/2019 on evaluation today patient presents for follow-up concerning his ongoing bilateral lower extremity edema. Unfortunately he is still having quite a bit of swelling the compression wraps are controlling this to some degree but he did see Dr. [name] his cardiologist I do have that available for review today as far as the appointment was concerned that was on 12/06/2019. Obviously that she has been 2 days ago. The patient states that he is only been taking the Lasix 80 mg 1 time a day he had told me previously he was taking this twice a day. Nonetheless Dr. [name] recommended this be up to 80 mg 2 times a  day for the patient as he did appear to be fluid overloaded. With that being said the patient states he did this yesterday and he was unable to go anywhere or do anything due to the fact that he was constantly having to urinate. Nonetheless I think that this is still good to be something that is important for him as far as trying to get his edema under control at all things that he is going to be able to just expect his wounds to get under control and things to be better without going through at least a period of time where he is trying to stabilize his fluid management in general and I think increasing the Lasix is likely the first step here. It was also mentioned the possibility that the patient may require metolazone. With that being said he wanted to have the patient take Lasix twice a day first and then reevaluating 2 months to see where things stand. 12/15/2019 upon evaluation today patient appears to be doing regard to his legs although his toes are showing some signs of weeping especially on the left at this point to some degree on the right. There does not appear to be any signs of active infection and overall I do feel like the compression wraps are doing well for him but he has not been able to take the Lasix at home and the increased dose that Dr. [name] recommended. He tells me that just not go to be feasible for him. Nonetheless I think in this case he should probably send a message to Dr. [name] in order to discuss options from the standpoint of possible admission to get the fluid off or otherwise going forward. 12/22/2019 upon evaluation today patient appears to be doing fairly well with regard to his lower extremities at this point. In fact he would be doing excellent if it was not for the fact that his right anterior thigh apparently had an allergic reaction to adhesive tape that he used. The wound itself that we have been monitoring actually appears to be healed. There is a lot of  irritation at this point. 12/29/2019 upon evaluation today patient appears to be doing well in  regard to his lower extremities. His left medial thigh is open and somewhat draining today but this is the only region that is open the right has done much better with the treatment utilizing the steroid cream that I prescribed for him last week. Overall I am pleased in that regard. Fortunately there is no signs of active infection at this time. No fevers, chills, nausea, vomiting, or diarrhea. 01/05/2020 upon evaluation today patient appears to be doing more poorly in regard to his right lower extremity at this point upon evaluation today. Unfortunately he continues to have issues in this regard and I think the biggest issue is controlling his edema. This obviously is not very well controlled at this point is been recommended that he use the Lasix twice a day but he has not been able to do that. Unfortunately I think this is leading to an issue where honestly he is not really able to effectively control his edema and therefore the wounds really are not doing significantly better. I do not think that he is going to be able to keep things under good control unless he is able to control his edema much better. I discussed this again in great detail with him today. 01/12/2020 good news is patient actually appears to be doing quite well today at this point. He does have an appointment with lymphedema clinic tomorrow. His legs appear healed and the toe on the left is almost completely healed. In general I am very pleased with how things stand at this point. 01/19/2020 upon evaluation today patient appears to actually be doing well in regard to his lower extremities there is nothing open at this point. Fortunately he has done extremely well more recently. Has been seeing lymphedema clinic as well. With that being said he has Velcro wraps for his lower legs as well as his upper legs. The only wound really is on his toe  which is the right great toe and this is barely anything even there. With all that being said I think it is good to be appropriate today to go ahead and switch him over to the Velcro compression wraps. 01/26/2020 upon evaluation today patient appears to be doing worse with regard to his lower extremities after last week switch him to Velcro compression wraps. Unfortunately he lasted less than 24 hours he did not have the sock portion of his Velcro wrap on the left leg and subsequently developed a blister underneath the Velcro portion. Obviously this is not good and not what we were looking for at this point. He states the lymphedema clinic did tell him to wear the wrap for 23 hours and take him off for 1 I am okay with that plan but again right now we got a get things back under control again he may have some cellulitis noted as well. 02/02/2020 upon evaluation today patient unfortunately appears to have several areas of blistering on his bilateral lower extremities today mainly on the feet. His legs do seem to be doing somewhat better which is good news. Fortunately there is no evidence of active infection at this time. No fevers, chills, nausea, vomiting, or diarrhea. 02/16/2020 upon evaluation today patient appears to be doing well at this time with regard to his legs. He has a couple weeping areas on his toes but for the most part everything is doing better and does appear to be sealed up on his legs which is excellent news. We can continue with wrapping him at this point as  he had every time we discontinue the wraps he just breaks out with new wounds. There is really no point in is going forward with this at this point. 03/08/2020 upon evaluation today patient actually appears to be doing quite well with regard to his lower extremity ulcers. He has just a very superficial and really almost nonexistent blister on the left lower extremity he has in general done very well with the compression wraps. With  that being said I do not see any signs of infection at this time which is good news. 03/29/2020 upon evaluation today patient appears to be doing well with regard to his wounds currently except for where he had several new areas that opened up due to some of the wrap slipping and causing him trouble. He states he did not realize they had slipped. Nonetheless he has a 1 area on the right and 3 new areas on the left. Fortunately there is no signs of active infection at this time which is great news. 04/05/2020 upon evaluation today patient actually appears to be doing quite well in general in regard to his legs currently. Fortunately there is no signs of active infection at this time. No fevers, chills, nausea, vomiting, or diarrhea. He tells me next week that he will actually be seen in the lymphedema clinic on Thursday at 10 AM I see him on Wednesday next week. 04/12/2020 upon evaluation today patient appears to be doing very well with regard to his lower extremities bilaterally. In fact he does not appear to have any open wounds at this point which is good news. Fortunately there is no signs of active infection at this time. No fevers, chills, nausea, vomiting, or diarrhea. 04/19/2020 upon evaluation today patient appears to be doing well with regard to his wounds currently on the bilateral lower extremities. There does not appear to be any signs of active infection at this time. Fortunately there is no evidence of systemic infection and overall very pleased at this point. Nonetheless after I held him out last week he literally had blisters the next morning already which swelled up with him being right back here in the clinic. Overall I think that he is just not can be able to be discharged with his legs the way they are he is much to volume overloaded as far as fluid is concerned and that was discussed with him today of also discussed this but should try the clinic nurse manager as well as Dr.  Leanord Hawking. 04/26/2020 upon evaluation today patient appears to be doing better with regard to his wounds currently. He is making some progress and overall swelling is under good control with the compression wraps. Fortunately there is no evidence of active infection at this time. 05/10/2020 on evaluation today patient appears to be doing overall well in regard to his lower extremities bilaterally. He is Tolerating the compression wraps without complication and with what we are seeing currently I feel like that he is making excellent progress. There is no signs of active infection at this time. 05/24/2020 upon evaluation today patient appears to be doing well in regard to his legs. The swelling is actually quite a bit down compared to where it has been in the past. Fortunately there is no sign of active infection at this time which is also good news. With that being said he does have several wounds on his toes that have opened up at this point. 05/31/2020 upon evaluation today patient appears to be doing well with  regard to his legs bilaterally where he really has no significant fluid buildup at this point overall he seems to be doing quite well. Very pleased in this regard. With regard to his toes these also seem to be drying up which is excellent. We have continue to wrap him as every time we tried as a transition to the juxta light wraps things just do not seem to get any better. 06/07/2020 upon evaluation today patient appears to be doing well with regard to his right leg at this point. Unfortunately left leg has a lot of blistering he tells me the wrap started to slide down on him when he tried to put his other Velcro wrap over top of it to help keep things in order but nonetheless still had some issues. 06/14/2020 on evaluation today patient appears to be doing well with regard to his lower extremity ulcers and foot ulcers at this point. I feel like everything is actually showing signs of improvement which  is great news overall there is no signs of active infection at this time. No fevers, chills, nausea, vomiting, or diarrhea. 06/21/2020 on evaluation today patient actually appears to be doing okay in regard to his wounds in general. With that being said the biggest issue I see is on his right foot in particular the first and second toe seem to be doing a little worse due to the fact this is staying very wet. I think he is probably getting need to change out his dressings a couple times in between each week when we see him in regard to his toes in order to keep this drier based on the location and how this is proceeding. 06/28/2020 on evaluation today patient appears to be doing a little bit more poorly overall in regard to the appearance of the skin I am actually somewhat concerned about the possibility of him having a little bit of an infection here. We discussed the course of potentially giving him a doxycycline prescription which he is taken previously with good result. With that being said I do believe that this is potentially mild and at this point easily fixed. I just do not want anything to get any worse. 07/12/2020 upon evaluation today patient actually appears to be making some progress with regard to his legs which is great news there does not appear to be any evidence of active infection. Overall very pleased with where things stand. 07/26/2020 upon evaluation today patient appears to be doing well with regard to his leg ulcers and toe ulcers at this point. He has been tolerating the compression wraps without complication overall very pleased in this regard. 08/09/2020 upon evaluation today patient appears to be doing well with regard to his lower extremities bilaterally. Fortunately there is no signs of active infection overall I am pleased with where things stand. 08/23/2020 on evaluation today patient appears to be doing well with regard to his wound. He has been tolerating the dressing  changes without complication. Fortunately there is no signs of active infection at this time. Overall his legs seem to be doing quite well which is great news and I am very pleased in that regard. No fevers, chills, nausea, vomiting, or diarrhea. 09/13/2020 upon evaluation today patient appears to be doing okay in regard to his lower extremities. He does have a fairly large blister on the right leg which I did remove the blister tissue from today so we can get this to dry out other than that however he seems  to be doing quite well. There is no signs of active infection at this time. 09/27/2020 upon evaluation today patient appears to actually be doing some better in regard to his right leg. Fortunately signs of active infection at this time which is great news. No fevers, chills, nausea, vomiting, or diarrhea. 10/04/2020 upon evaluation today patient actually appears to be showing signs of improvement which is great news with regard to his leg ulcers. Fortunately there is no signs of active infection which is great news he is still taking the antibiotics currently. No fevers, chills, nausea, vomiting, or diarrhea. 10/18/2020 on evaluation today patient appears to be doing well with regard to his legs currently. He has been tolerating the dressing changes including the wraps without complication. Fortunately there is no signs of active infection at this time. No fevers, chills, nausea, vomiting, or diarrhea. 10/25/2020 upon evaluation today patient appears to be doing decently well in regard to his wounds currently. He has been tolerating the dressing changes without complication. Overall I feel like he is making good progress albeit slow. Again this is something we can have to continue to wrap for some time to come most likely. 11/08/2020 upon evaluation today patient appears to be doing well with regard to his wounds currently. He has been tolerating the dressing changes without complication is not  currently on any antibiotics and he does not appear to show any signs of infection. He does continue to have a lot of drainage on the right leg not too severe but nonetheless this is very scattered. On the left leg this is looking to be much improved overall. 11/15/2020 upon evaluation today patient appears to be doing better with regard to his legs bilaterally. Especially the right leg which was much more significant last week. There does not appear to be any signs of active infection which is great news. No fevers, chills, nausea, vomiting, or diarrhea. 11/23/2019 upon evaluation today patient appears to be doing poorly still in regard to his lower extremities bilaterally. Unfortunately his right leg in particular appears to be doing much more poorly there is no signs really of infection this is not warm to touch but he does have a lot of drainage and weeping unfortunately. With that reason I do believe that we may need to initiate some treatment here to try to help calm down some of the swelling of the right leg. I think switching to a 4-layer compression wrap would be beneficial here. The patient is in agreement with giving this a try. 11/29/2020 upon evaluation today patient appears to be doing well currently in regard to his leg ulcers. I feel like the right leg is doing better he still has a lot of drainage but we do see some improvement here. The 4-layer compression wrap I think was helpful. 12/06/2020 upon evaluation today patient appears to be doing well with regard to his legs. In fact they seem to be doing about the best I have seen up to this point. Fortunately there is no signs of active infection at this time. No fevers, chills, nausea, vomiting, or diarrhea. 12/20/2020 upon evaluation today patient appears to be doing well at this time in regard to his legs. He is not having any significant draining which is great news. Fortunately there is no signs of active infection at this time. No fevers,  chills, nausea, vomiting, or diarrhea. 01/17/2021 upon evaluation today [name] actually appears to be doing excellent in regard to his legs. He has a few  areas again that come and go as far as his toes are concerned but overall this is doing quite well. 01/31/2021 upon evaluation today patient appears to be doing well with regard to his legs. Fortunately there does not appear to be any signs of active infection which is great news. Overall he is still having significant edema despite the compression wraps basically the 4-layer compression wrap to just keep things under control there is really not much room for play. Electronic Signature(s) Signed: 01/31/2021 11:31:59 AM By: Lenda KelpStone III, Sophiamarie Nease PA-C Entered By: Lenda KelpStone III, Keatyn Jawad on 01/31/2021 11:31:58 -------------------------------------------------------------------------------- Physical Exam Details Patient Name: Date of Service: [facility] [name], [name] 01/31/2021 10:30 A M Medical Record Number: [id number] Patient Account Number: [id number] Date of Birth/Sex: Treating RN: 02/25/1951 (70 y.o. Damaris SchoonerM) Boehlein, Linda Primary Care Provider: Nicoletta BaMcGowen, [name] Other Clinician: Referring Provider: Treating Provider/Extender: Adele DanStone III, Neyah Ellerman McGowen, [name] in Treatment: 262 Constitutional Obese and well-hydrated in no acute distress. Respiratory normal breathing without difficulty. Psychiatric this patient is able to make decisions and demonstrates good insight into disease process. Alert and Oriented x 3. pleasant and cooperative. Notes Upon inspection patient's wound bed showed signs of good granulation epithelization at this point. There does not appear to be any signs of infection which is great news and overall I will continue to monitor the patient week by week. Obviously I think the biggest thing he needs to elevate his legs, uses pumps, and he really needs to be doing twice a day fluid pills although he is only doing this once a day. Electronic  Signature(s) Signed: 01/31/2021 11:32:36 AM By: Lenda KelpStone III, Zebulon Gantt PA-C Entered By: Lenda KelpStone III, Delwin Raczkowski on 01/31/2021 11:32:36 -------------------------------------------------------------------------------- Physician Orders Details Patient Name: Date of Service: [name], [name] 01/31/2021 10:30 A M Medical Record Number: [id number] Patient Account Number: [id number] Date of Birth/Sex: Treating RN: 06/14/1951 (70 y.o. 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 - MD Return A Nurse Visit: - 1 week Bathing/ Shower/ Hygiene May shower with protection but do not get wound dressing(s) wet. Edema Control - Lymphedema / SCD / Other Bilateral Lower Extremities Lymphedema Pumps. Use Lymphedema pumps on leg(s) 2-3 times a day for 45-60 minutes. If wearing any wraps or hose, do not remove them. Continue exercising as instructed. Elevate legs to the level of the heart or above for 30 minutes daily and/or when sitting, a frequency of: - throughout the day Avoid standing for long periods of time. Exercise regularly Non Wound Condition Bilateral Lower Extremities Other Non Wound Condition Orders/Instructions: - lotion to both legs, 4 layer compression wraps right and left legs Wound  Treatment Wound #193 - T Second oe Wound Laterality: Left Prim Dressing: KerraCel Ag Gelling Fiber Dressing, 2x2 in (silver alginate) Every Other Day/15 Days ary Discharge Instructions: Apply silver alginate to wound bed as instructed Secondary Dressing: Woven Gauze Sponges 2x2 in Every Other Day/15 Days Discharge Instructions: Apply over primary  dressing as directed. Secured With: Insurance underwriter, Sterile 2x75 (in/in) Every Other Day/15 Days Discharge Instructions: Secure with stretch gauze as directed. Electronic Signature(s) Signed: 01/31/2021 5:20:30 PM By: Lenda Kelp PA-C Signed: 01/31/2021 5:50:20 PM By: Zenaida Deed RN, BSN Entered By: Zenaida Deed on 01/31/2021 11:31:03 -------------------------------------------------------------------------------- Problem List Details Patient Name: Date of Service: Kevin [name], [name] 01/31/2021 10:30 A M Medical Record Number: [id number] Patient Account Number: [id number] Date of Birth/Sex: Treating RN: Apr 04, 1951 (70 y.o. [name] Primary Care Provider: Nicoletta Ba Other Clinician: Referring Provider: Treating Provider/Extender: Adele Dan in Treatment: 262 Active Problems ICD-10 Encounter Encounter Code Description Active Date MDM Diagnosis L97.211 Non-pressure chronic ulcer of right calf limited to breakdown of skin 06/30/2018 No Yes L97.221 Non-pressure chronic ulcer of left calf limited to breakdown of skin 09/30/2016 No Yes 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 of 01/22/2016 No Yes bilateral lower extremity I89.0 Lymphedema, not elsewhere classified 01/22/2016 No Yes E11.622 Type 2 diabetes mellitus with other skin ulcer 01/22/2016 No Yes 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, unspecified 01/22/2016 No Yes L03.116 Cellulitis of left lower limb 04/01/2017 No Yes Inactive Problems ICD-10 Code Description Active  Date Inactive Date L97.211 Non-pressure chronic ulcer of right calf limited to breakdown of skin 06/30/2017 06/30/2017 L97.521 Non-pressure chronic ulcer of other 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 of skin 04/27/2018 04/27/2018 L03.115 Cellulitis of right lower limb 12/22/2017 12/22/2017 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 06/30/2018 06/30/2018 L97.511 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 of skin 06/30/2018 06/30/2018 Resolved Problems Electronic Signature(s) Signed: 01/31/2021 10:22:55 AM By: Lenda Kelp PA-C Entered By: Lenda Kelp on 01/31/2021 10:22:54 -------------------------------------------------------------------------------- Progress Note Details Patient Name: Date of Service: [name], Erubiel J. 01/31/2021 10:30 A M Medical Record Number: [id number] Patient Account Number: [id number] Date of Birth/Sex: Treating RN: [birth year] (70 y.o. [name] Primary Care Provider: Nicoletta Ba Other Clinician: Referring Provider: Treating Provider/Extender: Adele Dan in Treatment: 262 Subjective Chief Complaint Information obtained from Patient patient is here for evaluation venous/lymphedema weeping History of Present Illness (HPI) Referred by PCP for consultation. Patient has long standing history of BLE venous stasis, no prior ulcerations. At beginning of month, developed cellulitis and weeping. Received IM Rocephin followed by Keflex and resolved. Wears compression stocking, appr 6 months old. Not sure strength. No present drainage. 01/22/16 this is a patient who is a type II diabetic on insulin. 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 He tells me he religiously wears pressure stockings of uncertain strength. He was here with weeping edema about 8 months ago but did not have an open wound. Roughly a month ago he had a reopening on  his bilateral legs. He is been using bandages and Neosporin. He does not complain of pain. 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 Last BUN/creatinine I have is from 11/20/15 at 13 and 1.0 respectively 01/29/16; patient arrives today having tolerated the Profore wrap. He brought in his stockings and these are 18 mmHg stockings he bought from [place]. The compression here is likely inadequate. He does not complain of pain or excessive drainage she has no systemic symptoms. The wound on the right looks improved as does the one on the left  although one on the left is more substantial with still tissue at risk below the actual wound area on the bilateral posterior calf 02/05/16; patient arrives with poor edema control. He states that we did put a 4 layer compression on it last week. No weight appear 5 this. 02/12/16; the area on the posterior right Has healed. The left Has a substantial wound that has necrotic surface eschar that requires a debridement with a curette. 02/16/16;the patient called or a Nurse visit secondary to increased swelling. He had been in earlier in the week with his right leg healed. He was transitioned to is on pressure stocking on the right leg with the only open wound on the left, a substantial area on the left posterior calf. Note he has a history of severe lower extremity edema, he has a history of chronic atrial fibrillation but not heart failure per my notes but I'll need to research this. He is not complaining of chest pain shortness of breath or orthopnea. The intake nurse noted blisters on the previously closed right leg 02/19/16; this is the patient's regular visit day. I see him on Friday with escalating edema new wounds on the right leg and clear signs of at least right ventricular heart failure. I increased his Lasix to 40 twice a day. He is returning currently in  follow-up. States he is noticed a decrease in that the edema 02/26/16 patient's legs have much less edema. There is nothing really open on the right leg. The left leg has improved condition of the large superficial wound on the posterior left leg 03/04/16; edema control is very much better. The patient's right leg wounds have healed. On the left leg he continues to have severe venous inflammation on the posterior aspect of the left leg. There is no tenderness and I don't think any of this is cellulitis. 03/11/16; patient's right leg is married healed and he is in his own stocking. The patient's left leg has deteriorated somewhat. There is a lot of erythema around the wound on the posterior left leg. There is also a significant rim of erythema posteriorly just above where the wrap would've ended there is a new wound in this location and a lot of tenderness. Can't rule out cellulitis in this area. 03/15/16; patient's right leg remains healed and he is in his own stocking. The patient's left leg is much better than last review. His major wound on the posterior aspect of his left Is almost fully epithelialized. He has 3 small injuries from the wraps. Really. Erythema seems a lot better on antibiotics 03/18/16; right leg remains healed and he is in his own stocking. The patient's left leg is much better. The area on the posterior aspect of the left calf is fully epithelialized. His 3 small injuries which were wrap injuries on the left are improved only one seems still open his erythema has resolved 03/25/16; patient's right leg remains healed and he is in his own stocking. There is no open area today on the left leg posterior leg is completely closed up. His wrap injuries at the superior aspect of his leg are also resolved. He looks as though he has some irritation on the dorsal ankle but this is fully epithelialized without evidence of infection. 03/28/16; we discharged this patient on Monday. Transitioned him  into his own stocking. There were problems almost immediately with uncontrolled swelling weeping edema multiple some of which have opened. He does not feel systemically unwell in particular no chest pain  no shortness of breath and he does not feel 04/08/16; the edema is under better control with the Profore light wrap but he still has pitting edema. There is one large wound anteriorly 2 on the medial aspect of his left leg and 3 small areas on the superior posterior calf. Drainage is not excessive he is tolerating a Profore light well 04/15/16; put a Profore wrap on him last week. This is controlled is edema however he had a lot of pain on his left anterior foot most of his wounds are healed 04/22/16 once again the patient has denuded areas on the left anterior foot which he states are because his wrap slips up word. He saw his primary physician today is on Lasix 40 twice a day and states that he his weight is down 20 pounds over the last 3 months. 04/29/16: Much improved. left anterior foot much improved. He is now on Lasix 80 mg per day. Much improved edema control 05/06/16; I was hoping to be able to discharge him today however once again he has blisters at a low level of where the compression was placed last week mostly on his left lateral but also his left medial leg and a small area on the anterior part of the left foot. 05/09/16; apparently the patient went home after his appointment on 7/4 later in the evening developing pain in his upper medial thigh together with subjective fever and chills although his temperature was not taken. The pain was so intense he felt he would probably have to call 911. However he then remembered that he had leftover doxycycline from a previous round of antibiotics and took these. By the next morning he felt a lot better. He called and spoke to one of our nurses and I approved doxycycline over the phone thinking that this was in relation to the wounds we had previously seen  although they were definitely were not. The patient feels a lot better old fever no chills he is still working. Blood sugars are reasonably controlled 05/13/16; patient is back in for review of his cellulitis on his anterior medial upper thigh. He is taking doxycycline this is a lot better. Culture I did of the nodular area on the dorsal aspect of his foot grew MRSA this also looks a lot better. 05/20/16; the patient is cellulitis on the medial upper thigh has resolved. All of his wound areas including the left anterior foot, areas on the medial aspect of the left calf and the lateral aspect of the calf at all resolved. He has a new blister on the left dorsal foot at the level of the fourth toe this was excised. No evidence of infection 05/27/16; patient continues to complain weeping edema. He has new blisterlike wounds on the left anterior lateral and posterior lateral calf at the top of his wrap levels. The area on his left anterior foot appears better. He is not complaining of fever, pain or pruritus in his feet. 05/30/16; the patient's blisters on his left anterior leg posterior calf all look improved. He did not increase the Lasix 100 mg as I suggested because he was going to run out of his 40 mg tablets. He is still having weeping edema of his toes 06/03/16; I renewed his Lasix at 80 mg once a day as he was about to run out when I last saw him. He is on 80 mg of Lasix now. I have asked him to cut down on the excessive amount of water he  was drinking and asked him to drink according to his thirst mechanisms 06/12/2016 -- was seen 2 days ago and was supposed to wear his compression stockings at home but he is developed lymphedema and superficial blisters on the left lower extremity and hence came in for a review 06/24/16; the remaining wound is on his left anterior leg. He still has edema coming from between his toes. There is lymphedema here however his edema is generally better than when I last saw  this. He has a history of atrial fibrillation but does not have a known history of congestive heart failure nevertheless I think he probably has this at least on a diastolic basis. 07/01/16 I reviewed his echocardiogram from January 2017. This was essentially normal. He did not have LVH, EF of 55-60%. His right ventricular function was normal although he did have trivial tricuspid and pulmonic regurgitation. This is not audible on exam however. I increased his Lasix to do massive edema in his legs well above his knees I think in early July. He was also drinking an excessive amount of water at the time. 07/15/16; missed his appointment last week because of the Labor Day holiday on Monday. He could not get another appointment later in the week. Started to feel the wrap digging in superiorly so we remove the top half and the bottom half of his wrap. He has extensive erythema and blistering superiorly in the left leg. Very tender. Very swollen. Edema in his foot with leaking edema fluid. He has not been systemically unwell 07/22/16; the area on the left leg laterally required some debridement. The medial wounds look more stable. His wrap injury wounds appear to have healed. Edema and his foot is better, weeping edema is also better. He tells me he is meeting with the supplier of the external compression pumps at work 08/05/16; the patient was on vacation last week in [facility]. His wrap is been on for an extended period of time. Also over the weekend he developed an extensive area of tender erythema across his anterior medial thigh. He took to doxycycline yesterday that he had leftover from a previous prescription. The patient complains of weeping edema coming out of his toes 08/08/16; I saw this patient on 10/2. He was tender across his anterior thigh. I put him on doxycycline. He returns today in follow-up. He does not have any open wounds on his lower leg, he still has edema weeping into his  toes. 08/12/16; patient was seen back urgently today to follow-up for his extensive left thigh cellulitis/erysipelas. He comes back with a lot less swelling and erythema pain is much better. I believe I gave him Augmentin and Cipro. His wrap was cut down as he stated a roll down his legs. He developed blistering above the level of the wrap that remained. He has 2 open blisters and 1 intact. 08/19/16; patient is been doing his primary doctor who is increased his Lasix from 40-80 once a day or 80 already has less edema. Cellulitis has remained improved in the left thigh. 2 open areas on the posterior left calf 08/26/16; he returns today having new open blisters on the anterior part of his left leg. He has his compression pumps but is not yet been shown how to use some vital representative from the supplier. 09/02/16 patient returns today with no open wounds on the left leg. Some maceration in his plantar toes 09/10/2016 -- Dr. Leanord Hawking had recently discharged him on 09/02/2016 and he has come  right back with redness swelling and some open ulcers on his left lower extremity. He says this was caused by trying to apply his compression stockings and he's been unable to use this and has not been able to use his lymphedema pumps. He had some doxycycline leftover and he has started on this a few days ago. 09/16/16; there are no open wounds on his leg on the left and no evidence of cellulitis. 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 He does not complain of symptoms here. I am not clear use using his external compression pumps. 09/23/16; I have not seen this patient in 2 weeks. He canceled his appointment 10 days ago as he was going on vacation. He tells me that on Monday he noticed a large area on his posterior left leg which is been draining copiously and is reopened into a large wound. He is been using ABDs and the external part of his juxtalite,  according to our nurse this was not on properly. 10/07/16; Still a substantial area on the posterior left leg. Using silver alginate 10/14/16; in general better although there is still open area which looks healthy. Still using silver alginate. He reminds me that this happen before he left for [facility]. T oday while he was showering in the morning. He had been using his juxtalite's 10/21/16; the area on his posterior left leg is fully epithelialized. However he arrives today with a large area of tender erythema in his medial and posterior left thigh just above the knee. I have marked the area. Once again he is reluctant to consider hospitalization. I treated him with oral antibiotics in the past for a similar situation with resolution I think with doxycycline however this area it seems more extensive to me. He is not complaining of fever but does have chills and says states he is thirsty. His blood sugar today was in the 140s at home 10/25/16 the area on his posterior left leg is fully epithelialized although there is still some weeping edema. The large area of tenderness and erythema in his medial and posterior left thigh is a lot less tender although there is still a lot of swelling in this thigh. He states he feels a lot better. He is on doxycycline and Augmentin that I started last week. This will continued until Tuesday, December 26. I have ordered a duplex ultrasound of the left thigh rule out DVT whether there is an abscess something that would need to be drained I would also like to know. 11/01/16; he still has weeping edema from a not fully epithelialized area on his left posterior calf. Most of the rest of this looks a lot better. He has completed his antibiotics. His thigh is a lot better. Duplex ultrasound did not show a DVT in the thigh 11/08/16; he comes in today with more Denuded surface epithelium from the posterior aspect of his calf. There is no real evidence of cellulitis. The  superior aspect of his wrap appears to have put quite an indentation in his leg just below the knee and this may have contributed. He does not complain of pain or fever. We have been using silver alginate as the primary dressing. The area of cellulitis in the right thigh has totally resolved. He has been using his compression stockings once a week 11/15/16; the patient arrives today with more loss of epithelium from the posterior aspect of his left calf. He now has  a fairly substantial wound in this area. The reason behind this deterioration isn't exactly clear although his edema is not well controlled. He states he feels he is generally more swollen systemically. He is not complaining of chest pain shortness of breath fever. T me he has an appointment with his primary physician in early February. He is on 80 mg of oral ells Lasix a day. He claims compliance with the external compression pumps. He is not having any pain in his legs similar to what he has with his recurrent cellulitis 11/22/16; the patient arrives a follow-up of his large area on his left lateral calf. This looks somewhat better today. He came in earlier in the week for a dressing change since I saw him a week ago. He is not complaining of any pain no shortness of breath no chest pain 11/28/16; the patient arrives for follow-up of his large area on the left lateral calf this does not look better. In fact it is larger weeping edema. The surface of the wound does not look too bad. We have been using silver alginate although I'm not certain that this is a dressing issue. 12/05/16; again the patient follows up for a large wound on the left lateral and left posterior calf this does not look better. There continues to be weeping edema necrotic surface tissue. More worrisome than this once again there is erythema below the wound involving the distal Achilles and heel suggestive of cellulitis. He is on his feet working most of the day of this is  not going well. We are changing his dressing twice a week to facilitate the drainage. 12/12/16; not much change in the overall dimensions of the large area on the left posterior calf. This is very inflamed looking. I gave him an. Doxycycline last week does not really seem to have helped. He found the wrap very painful indeed it seems to of dog into his legs superiorly and perhaps around the heel. He came in early today because the drainage had soaked through his dressings. 12/19/16- patient arrives for follow-up evaluation of his left lower extremity ulcers. He states that he is using his lymphedema pumps once daily when there is "no drainage". He admits to not using his lipedema pumps while under current treatment. His blood sugars have been consistently between 150-200. 12/26/16; the patient is not using his compression pumps at home because of the wetness on his feet. I've advised him that I think it's important for him to use this daily. He finds his feet too wet, he can put a plastic bag over his legs while he is in the pumps. Otherwise I think will be in a vicious circle. We are using silver alginate to the major area on his left posterior calf 01/02/17; the patient's posterior left leg has further of all into 3 open wounds. All of them covered with a necrotic surface. He claims to be using his compression pumps once a day. His edema control is marginal. Continue with silver alginate 01/10/17; the patient's left posterior leg actually looks somewhat better. There is less edema, less erythema. Still has 3 open areas covered with a necrotic surface requiring debridement. He claims to be using his compression pumps once a day his edema control is better 01/17/17; the patient's left posterior calf look better last week when I saw him and his wrap was changed 2 days ago. He has noted increasing pain in the left heel and arrives today with much larger wounds extensive erythema  extending down into the entire  heel area especially tender medially. He is not systemically unwell CBGs have been controlled no fever. Our intake nurse showed me limegreen drainage on his AVD pads. 01/24/17; his usual this patient responds nicely to antibiotics last week giving him Levaquin for presumed Pseudomonas. The whole entire posterior part of his leg is much better much less inflamed and in the case of his Achilles heel area much less tender. He has also had some epithelialization posteriorly there are still open areas here and still draining but overall considerably better 01/31/17- He has continue to tolerate the compression wraps. he states that he continues to use the lymphedema pumps daily, and can increase to twice daily on the weekends. He is voicing no complaints or concerns regarding his LLE ulcers 02/07/17-he is here for follow-up evaluation. He states that he noted some erythema to the left medial and anterior thigh, which he states is new as of yesterday. He is concerned about recurrent cellulitis. He states his blood sugars have been slightly elevated, this morning in the 180s 02/14/17; he is here for follow-up evaluation. When he was last here there was erythema superiorly from his posterior wound in his anterior thigh. He was prescribed Levaquin however a culture of the wound surface grew MRSA over the phone I changed him to doxycycline on Monday and things seem to be a lot better. 02/24/17; patient missed his appointment on Friday therefore we changed his nurse visit into a physician visit today. Still using silver alginate on the large area of the posterior left thigh. He isn't new area on the dorsal left second toe 03/03/17; actually better today although he admits he has not used his external compression pumps in the last 2 days or so because of work responsibilities over the weekend. 03/10/17; continued improvement. External compression pumps once a day almost all of his wounds have closed on the posterior left  calf. Better edema control 03/17/17; in general improved. He still has 3 small open areas on the lateral aspect of his left leg however most of the area on the posterior part of his leg is epithelialized. He has better edema control. He has an ABD pad under his stocking on the right anterior lower leg although he did not let us look at that today. 03/24/17; patient arrives back in clinic today with no open areas however there are areas on the posterior left calf and anterior left calf that are less than 100% epithelialized. His edema is well controlled in the left lower leg. There is some pitting edema probably lymphedema in the left upper thigh. He uses compression pumps at home once per day. I tried to get him to do this twice a day although he is very reticent. 04/01/2017 -- for the last 2 days he's had significant redness, tenderness and weeping and came in for an urgent visit today. 04/07/17; patient still has 6 more days of doxycycline. He was seen by Dr. Meyer Russel last Wednesday for cellulitis involving the posterior aspect, lateral aspect of his Involving his heel. For the most part he is better there is less erythema and less weeping. He has been on his feet for 12 hours o2 over the weekend. Using his compression pumps once a day 04/14/17 arrives today with continued improvement. Only one area on the posterior left calf that is not fully epithelialized. He has intense bilateral venous inflammation associated with his chronic venous insufficiency disease and secondary lymphedema. We have been using silver alginate  to the left posterior calf wound In passing he tells [place] today that the right leg but we have not seen in quite some time has an open area on it but he doesn't want [place] to look at this today states he will show this to [place] next week. 04/21/17; there is no open area on his left leg although he still reports some weeping edema. He showed [place] his right leg today which is the first time we've  seen this leg in a long time. He has a large area of open wound on the right leg anteriorly healthy granulation. Quite a bit of swelling in the right leg and some degree of venous inflammation. He told us about the right leg in passing last week but states that deterioration in the right leg really only happened over the weekend 04/28/17; there is no open area on the left leg although there is an irritated part on the posterior which is like a wrap injury. The wound on the right leg which was new from last week at least to [place] is a lot better. 05/05/17; still no open area on the left leg. Patient is using his new compression stocking which seems to be doing a good job of controlling the edema. He states he is using his compression pumps once per day. The right leg still has an open wound although it is better in terms of surface area. Required debridement. A lot of pain in the posterior right Achilles marked tenderness. Usually this type of presentation this patient gives concern for an active cellulitis 05/12/17; patient arrives today with his major wound from last week on the right lateral leg somewhat better. Still requiring debridement. He was using his compression stocking on the left leg however that is reopened with superficial wounds anteriorly he did not have an open wound on this leg previously. He is still using his juxta light's once daily at night. He cannot find the time to do this in the morning as he has to be at work by 7 AM 05/19/17; right lateral leg wound looks improved. No debridement required. The concerning area is on the left posterior leg which appears to almost have a subcutaneous hemorrhagic component to it. We've been using silver alginate to all the wounds 05/26/17; the right lateral leg wound continues to look improved. However the area on the left posterior calf is a tightly adherent surface. [place] using silver alginate. Because of the weeping edema in his legs there is very  little good alternatives. 06/02/17; the patient left here last week looking quite good. Major wound on the left posterior calf and a small one on the right lateral calf. Both of these look satisfactory. He tells me that by Wednesday he had noted increased pain in the left leg and drainage. He called on Thursday and Friday to get an appointment here but we were blocked. He did not go to urgent care or his primary physician. He thinks he had a fever on Thursday but did not actually take his temperature. He has not been using his compression pumps on the left leg because of pain. I advised him to go to the emergency room today for IV antibiotics for stents of left leg cellulitis but he has refused I have asked him to take 2 days off work to keep his leg elevated and he has refused this as well. In view of this I'm going to call him and Augmentin and doxycycline. He tells me he took  some leftover doxycycline starting on Friday previous cultures of the left leg have grown MRSA 06/09/2017 -- the patient has florid cellulitis of his left lower extremity with copious amount of drainage and there is no doubt in my mind that he needs inpatient care. However after a detailed discussion regarding the risk benefits and alternatives he refuses to get admitted to the hospital. With no other recourse I will continue him on oral antibiotics as before and hopefully he'll have his infectious disease consultation this week. 06/16/2017 -- the patient was seen today by the nurse practitioner at infectious disease Ms. Dixon. 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 He has also been advise Lotrimin cream locally. she also had wise IV antibiotics for his condition if it flares up 06/23/17; patient arrives today with drainage bilaterally although  the remaining wound on the left posterior calf after cleaning up today "highlighter yellow drainage" did not look too bad. Unfortunately he has had breakdown on the right anterior leg [previously this leg had not been open and he is using a black stocking] he went to see infectious disease and is been put on clindamycin 150 mg daily, I did not verify the dose although I'm not familiar with using clindamycin in this dosing range, perhaps for prophylaxisoo 06/27/17; I brought this patient back today to follow-up on the wound deterioration on the right lower leg together with surrounding cellulitis. I started him on doxycycline 4 days ago. This area looks better however he comes in today with intense cellulitis on the medial part of his left thigh. This is not have a wound in this area. Extremely tender. We've been using silver alginate to the wounds on the right lower leg left lower leg with bilateral 4 layer compression he is using his external compression pumps once a day 07/04/17; patient's left medial thigh cellulitis looks better. He has not been using his compression pumps as his insert said it was contraindicated with cellulitis. His right leg continues to make improvements all the wounds are still open. We only have one remaining wound on the left posterior calf. Using silver alginate to all open areas. He is on doxycycline which I started a week ago and should be finishing I gave him Augmentin after Thursday's visit for the severe cellulitis on the left medial thigh which fortunately looks better 07/14/17; the patient's left medial thigh cellulitis has resolved. The cellulitis in his right lower calf on the right also looks better. All of his wounds are stable to improved we've been using silver alginate he has completed the antibiotics I have given him. He has clindamycin 150 mg once a day prescribed by infectious disease for prophylaxis, I've advised him to start this now. We have been using  bilateral Unna boots over silver alginate to the wound areas 07/21/17; the patient is been to see infectious disease who noted his recurrent problems with cellulitis. He was not able to tolerate prophylactic clindamycin therefore he is on amoxicillin 500 twice a day. He also had a second daily dose of Lasix added By Dr. Oneta Rack but he is not taking this. Nor is he being completely compliant with his compression pumps a especially not this week. He has 2 remaining wounds one on the right posterior lateral lower leg and one on the left posterior  medial lower leg. 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 The patient has Unna boots bilaterally. 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 He has bilateral Unna boots for compression and silver alginate to his wounds. Arrives today with his legs looking as good as I have seen him in quite some time. Not surprisingly his wounds look better as well with improvement on the right lateral leg venous insufficiency wound and also the left medial leg. He is still using the compression pumps once a day 08/11/17; both legs appear to be doing better wounds on the right lateral and left medial legs look better. Skin on the right leg quite good. He is been using silver alginate as the primary dressing. I'm going to use Anasept gel calcium alginate and maintain all the secondary dressings 08/18/17; the patient continues to actually do quite well. The area on his right lateral leg is just about closed the left medial also looks better although it is still moist in this area. His edema is well controlled we have been using Anasept gel with calcium alginate and the usual secondary dressings, 4 layer compression and once daily use of his compression pumps  "always been able to manage 09/01/17; the patient continues to do reasonably well in spite of his trip to [place]. The area on the right lateral leg is epithelialized. Left is much better but still open. He has more edema and more chronic erythema on the left leg [venous inflammation] 09/08/17; he arrives today with no open wound on the right lateral leg and decently controlled edema. Unfortunately his left leg is not nearly as in his good situation as last week.he apparently had increasing edema starting on Saturday. He edema soaked through into his foot so used a plastic bag to walk around his home. The area on the medial right leg which was his open area is about the same however he has lost surface epithelium on the left lateral which is new and he has significant pain in the Achilles area of the left foot. He is already on amoxicillin chronically for prophylaxis of cellulitis in the left leg 09/15/17; he is completed a week of doxycycline and the cellulitis in the left posterior leg and Achilles area is as usual improved. He still has a lot of edema and fluid soaking through his dressings. There is no open wound on the right leg. He saw infectious disease NP today 09/22/17;As usual 1 we transition him from our compression wraps to his stockings things did not go well. He has several small open areas on the right leg. He states this was caused by the compression wrap on his skin although he did not wear this with the stockings over them. He has several superficial areas on the left leg medially laterally posteriorly. He does not have any evidence of active cellulitis especially involving the left Achilles The patient is traveling from [facility] Saturday going to [facility]. He states he isn't attempting to get an appointment with a heel objects wound center there to change his dressings. I am not completely certain whether this will work 10/06/17; the patient came in on Friday for a nurse visit  and the nurse reported that his legs actually look quite good. He arrives in clinic today for his  regular follow-up visit. He has a new wound on his left third toe over the PIP probably caused by friction with his footwear. He has small areas on the left leg and a very superficial but epithelialized area on the right anterior lateral lower leg. Other than that his legs look as good as I've seen him in quite some time. We have been using silver alginate Review of systems; no chest pain no shortness of breath other than this a 10 point review of systems negative 10/20/17; seen by Dr. Meyer Russel last week. He had taken some antibiotics [doxycycline] that he had left over. Dr. Meyer Russel thought he had candida infection and declined to give him further antibiotics. He has a small wound remaining on the right lateral leg several areas on the left leg including a larger area on the left posterior several left medial and anterior and a small wound on the left lateral. The area on the left dorsal third toe looks a lot better. ROS; Gen.; no fever, respiratory no cough no sputum Cardiac no chest pain other than this 10 point review of system is negative 10/30/17; patient arrives today having fallen in the bathtub 3 days ago. It took him a while to get up. He has pain and maceration in the wounds on his left leg which have deteriorated. He has not been using his pumps he also has some maceration on the right lateral leg. 11/03/17; patient continues to have weeping edema especially in the left leg. This saturates his dressings which were just put on on 12/27. As usual the doxycycline seems to take care of the cellulitis on his lower leg. He is not complaining of fever, chills, or other systemic symptoms. He states his leg feels a lot better on the doxycycline I gave him empirically. He also apparently gets injections at his primary doctor's officeo Rocephin for cellulitis prophylaxis. I didn't ask him about his  compression pump compliance today I think that's probably marginal. Arrives in the clinic with all of his dressings primary and secondary macerated full of fluid and he has bilateral edema 11/10/17; the patient's right leg looks some better although there is still a cluster of wounds on the right lateral. The left leg is inflamed with almost circumferential skin loss medially to laterally although we are still maintaining anteriorly. He does not have overt cellulitis there is a lot of drainage. He is not using compression pumps. We have been using silver alginate to the wound areas, there are not a lot of options here 11/17/17; the patient's right leg continues to be stable although there is still open wounds, better than last week. The inflammation in the left leg is better. Still loss of surface layer epithelium especially posteriorly. There is no overt cellulitis in the amount of edema and his left leg is really quite good, tells me he is using his compression pumps once a day. 11/24/17; patient's right leg has a small superficial wound laterally this continues to improve. The inflammation in the left leg is still improving however we have continuous surface layer epithelial loss posteriorly. There is no overt cellulitis in the amount of edema in both legs is really quite good. He states he is using his compression pumps on the left leg once a day for 5 out of 7 days 12/01/17; very small superficial areas on the right lateral leg continue to improve. Edema control in both legs is better today. He has continued loss of surface epithelialization and left posterior calf  although I think this is better. We have been using silver alginate with large number of absorptive secondary dressings 4 layer on the left Unna boot on the right at his request. He tells me he is using his compression pumps once a day 12/08/17; he has no open area on the right leg is edema control is good here. ooOn the left leg however he  has marked erythema and tenderness breakdown of skin. He has what appears to be a wrap injury just distal to the popliteal fossa. 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 There was swelling erythema and breakdown in the right leg. He has much worse skin breakdown in the left leg as well multiple open areas medially and posteriorly as well as laterally. He tells me he has been using his compression pumps but tells me he feels that the drainage out of his leg is worse when he uses a compression pumps. T be fair to him he is been saying this o for a while however I don't know that I have really been listening to this. I wonder if the compression pumps are working properly 12/22/17;. Once again he arrives with severe erythema, weeping edema from the left greater than right leg. Noncompliance with compression pumps. New this visit he is complaining of pain on the lateral aspect of the right leg and the medial aspect of his right thigh. He apparently saw his cardiologist Dr. [name] who was ordered an echocardiogram area and I think this is a step in the right direction 12/25/17; started his doxycycline Monday night. There is still intense erythema of the right leg especially in the anterior thigh although there is less tenderness. The erythema around the wound on the right lateral calf also is less tender. He still complaining of pain in the left heel. His wounds are about the same right lateral left medial left lateral. Superficial but certainly not close to closure. He denies being systemically unwell no fever chills no abdominal pain no diarrhea 12/29/17; back in follow-up of his extensive right calf and right thigh cellulitis. I  added amoxicillin to cover possible doxycycline resistant strep. This seems to of done the trick he is in much less pain there is much less erythema and swelling. He has his echocardiogram at 11:00 this morning. X-ray of the left heel was also negative. 01/05/18; the patient arrived with his edema under much better control. Now that he is retired he is able to use his compression pumps daily and sometimes twice a day per the patient. He has a wound on the right leg the lateral wound looks better. Area on the left leg also looks a lot better. He has no evidence of cellulitis in his bilateral thighs I had a quick peak at his echocardiogram. He is in normal ejection fraction and normal left ventricular function. He has moderate pulmonary hypertension moderately reduced right ventricular function. One would have to wonder about chronic sleep apnea although he says he doesn't snore. He'll review the echocardiogram with his cardiologist. 01/12/18; the patient arrives with the edema in both legs under exemplary control. He is using his compression pumps daily and sometimes twice daily. His wound on the right lateral leg is just about  closed. He still has some weeping areas on the posterior left calf and lateral left calf although everything is just about closed here as well. I have spoken with Aldean Baker who is the patient's nurse practitioner and infectious disease. She was concerned that the patient had not understood that the parenteral penicillin injections he was receiving for cellulitis prophylaxis was actually benefiting him. I don't think the patient actually saw that I would tend to agree we were certainly dealing with less infections although he had a serious one last month. 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 He is healed. He'll be placed in juxtalite compression wraps and increase his lymphedema pumps to twice daily. We will follow up again next week  to ensure there are no issues with the new regiment. 01/20/18-he is here for evaluation of bilateral lower extremity weeping edema. Yesterday he was placed in compression wrap to the right lower extremity and compression stocking to left lower shrubbery. He states he uses lymphedema pumps last night and again this morning and noted a blister to the left lower extremity. On exam he was noted to have drainage to the right lower extremity. He will be placed in Unna boots bilaterally and follow-up next week 01/26/18; patient was actually discharged a week ago to his own juxta light stockings only to return the next day with bilateral lower extremity weeping edema.he was placed in bilateral Unna boots. He arrives today with pain in the back of his left leg. There is no open area on the right leg however there is a linear/wrap injury on the left leg and weeping edema on the left leg posteriorly. I spoke with infectious disease about 10 days ago. They were disappointed that the patient elected to discontinue prophylactic intramuscular penicillin shots as they felt it was particularly beneficial in reducing the frequency of his cellulitis. I discussed this with the patient today. He does not share this view. He'll definitely need antibiotics today. Finally he is traveling to [place] and trauma leaving this Saturday and returning a week later and he does not travel with his pumps. He is going by car 01/30/18; patient was seen 4 days ago and brought back in today for review of cellulitis in the left leg posteriorly. I put him on amoxicillin this really hasn't helped as much as I might like. He is also worried because he is traveling to [facility] trauma by car. Finally we will be rewrapping him. There is no open area on the right leg over his left leg has multiple weeping areas as usual 02/09/18; The same wrap on for 10 days. He did not pick up the last doxycycline I prescribed for him. He apparently took 4 days  worth he already had. There is nothing open on his right leg and the edema control is really quite good. He's had damage in the left leg medially and laterally especially probably related to the prolonged use of Unna boots 02/12/18; the patient arrived in clinic today for a nurse visit/wrap change. He complained of a lot of pain in the left posterior calf. He is taking doxycycline that I previously prescribed for him. Unfortunately even though he used his stockings and apparently used to compression pumps twice a day he has weeping edema coming out of the lateral part of his right leg. This is coming from the lower anterior lateral skin area. 02/16/18; the patient has finished his doxycycline and will finish the amoxicillin 2 days. The area  of cellulitis in the left calf posteriorly has resolved. He is no longer having any pain. He tells me he is using his compression pumps at least once a day sometimes twice. 02/23/18; the patient finished his doxycycline and Amoxil last week. On Friday he noticed a small erythematous circle about the size of a quarter on the left lower leg just above his ankle. This rapidly expanded and he now has erythema on the lateral and posterior part of the thigh. This is bright red. Also has an area on the dorsal foot just above his toes and a tender area just below the left popliteal fossa. He came off his prophylactic penicillin injections at his own insistence one or 2 months ago. This is obviously deteriorated since then 03/02/18; patient is on doxycycline and Amoxil. Culture I did last week of the weeping area on the back of his left calf grew group B strep. I have therefore renewed the amoxicillin 500 3 times a day for a further week. He has not been systemically unwell. Still complaining of an area of discomfort right under his left popliteal fossa. There is no open wound on the right leg. He tells me that he is using his pumps twice a day on most days 03/09/18; patient  arrives in clinic today completing his amoxicillin today. The cellulitis on his left leg is better. Furthermore he tells me that he had intramuscular penicillin shots that his primary care office today. However he also states that the wrap on his right leg fell down shortly after leaving clinic last week. He developed a large blister that was present when he came in for a nurse visit later in the week and then he developed intense discomfort around this area.He tells me he is using his compression pumps 03/16/18; the patient has completed his doxycycline. The infectious part of this/cellulitis in the left heel area left popliteal area is a lot better. He has 2 open areas on the right calf. Still areas on the left calf but this is a lot better as well. 03/24/18; the patient arrives complaining of pain in the left popliteal area again. He thinks some of this is wrap injury. He has no open area on the right leg and really no open area on the left calf either except for the popliteal area. He claims to be compliant with the compression pumps 03/31/18; I gave him doxycycline last week because of cellulitis in the left popliteal area. This is a lot better although the surface epithelium is denuded off and response to this. He arrives today with uncontrolled edema in the right calf area as well as a fingernail injury in the right lateral calf. There is only a few open areas on the left 04/06/18; I gave him amoxicillin doxycycline over the last 2 weeks that the amoxicillin should be completing currently. He is not complaining of any pain or systemic symptoms. The only open areas see has is on the right lateral lower leg paradoxically I cannot see anything on the left lower leg. He tells me he is using his compression pumps twice a day on most days. Silver alginate to the wounds that are open under 4 layer compression 04/13/18; he completed antibiotics and has no new complaints. Using his compression pumps. Silver  alginate that anything that's opened 04/20/18; he is using his compression pumps religiously. Silver alginate 4 layer compression anything that's opened. He comes in today with no open wounds on the left leg but 3 on the  right including a new one posteriorly. He has 2 on the right lateral and one on the right posterior. He likes Unna boots on the right leg for reasons that aren't really clear we had the usual 4 layer compression on the left. It may be necessary to move to the 4 layer compression on the right however for now I left them in the Unna boots 04/27/18; he is using his compression pumps at least once a day. He has still the wounds on the right lateral calf. The area right posteriorly has closed. He does not have an open wound on the left under 4 layer compression however on the dorsal left foot just proximal to the toes and the left third toe 2 small open areas were identified 05/11/18; he has not uses compression pumps. The areas on the right lateral calf have coalesced into one large wound necrotic surface. On the left side he has one small wound anteriorly however the edema is now weeping out of a large part of his left leg. He says he wasn't using his pumps because of the weeping fluid. I explained to him that this is the time he needs to pump more 05/18/18; patient states he is using his compression pumps twice a day. The area on the right lateral large wound albeit superficial. On the left side he has innumerable number of small new wounds on the left calf particularly laterally but several anteriorly and medially. All these appear to have healthy granulated base these look like the remnants of blisters however they occurred under compression. The patient arrives in clinic today with his legs somewhat better. There is certainly less edema, less multiple open areas on the left calf and the right anterior leg looks somewhat better as well superficial and a little smaller. However he relates  pain and erythema over the last 3-4 days in the thigh and I looked at this today. He has not been systemically unwell no fever no chills no change in blood sugar values 05/25/18; comes in today in a better state. The severe cellulitis on his left leg seems better with the Keflex. Not as tender. He has not been systemically unwell ooHard to find an open wound on the left lower leg using his compression pumps twice a day ooThe confluent wounds on his right lateral calf somewhat better looking. These will ultimately need debridement I didn't do this today. 06/01/18; the severe cellulitis on the left anterior thigh has resolved and he is completed his Keflex. ooThere is no open wound on the left leg however there is a superficial excoriation at the base of the third toe dorsally. Skin on the bottom of his left foot is macerated looking. ooThe left the wounds on the lateral right leg actually looks some better although he did require debridement of the top half of this wound area with an open curet 06/09/18 on evaluation today patient appears to be doing poorly in regard to his right lower extremity in particular this appears to likely be infected he has very thick purulent discharge along with a bright green tent to the discharge. This makes me concerned about the possibility of pseudomonas. He's also having increased discomfort at this point on evaluation. Fortunately there does not appear to be any evidence of infection spreading to the other location at this time. 06/16/18 on evaluation today patient appears to actually be doing fairly well. His ulcer has actually diminished in size quite significantly at this point which is good news.  Nonetheless he still does have some evidence of infection he did see infectious disease this morning before coming here for his appointment. I did review the results of their evaluation and their note today. They did actually have him discontinue the Cipro and initiate  treatment with linezolid at this time. He is doing this for the next seven days and they recommended a follow-up in four months with them. He is the keep a log of the need for intermittent antibiotic therapy between now and when he falls back up with infectious disease. This will help them gaze what exactly they need to do to try and help them out. 06/23/18; the patient arrives today with no open wounds on the left leg and left third toe healed. He is been using his compression pumps twice a day. On the right lateral leg he still has a sizable wound but this is a lot better than last time I saw this. 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 Has been using silver alginate under 4 layer compression 06/30/18; the only open wound he has is on the right lateral leg and this looks healthy. No debridement is required. We have been using silver alginate. He does not have an open wound on the left leg. There is apparently some drainage from the dorsal proximal third toe on the left although I see no open wound here. 07/03/18 on evaluation today patient was actually here just for a nurse visit rapid change. However when he was here on Wednesday for his rat change due to having been healed on the left and then developing blisters we initiated the wrap again knowing that he would be back today for [place] to reevaluate and see were at. Unfortunately he has developed some cellulitis into the proximal portion of his right lower extremity even into the region of his thigh. 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 He was placed on one as what at that point. Nonetheless he is done with that and has been tolerating it well otherwise. Doxycycline which in the past really did not seem to be effective for him. Nonetheless I think the best option may be for [place] to definitely  reinitiate the antibiotics for a longer period of time. 07/07/18; since I last saw this patient a week ago he has had a difficult time. At that point he did not have an open wound on his left leg. We transitioned him into juxta light stockings. He was apparently in the clinic the next day with blisters on the left lateral and left medial lower calf. He also had weeping edema fluid. He was put back into a compression wrap. He was also in the clinic on Friday with intense erythema in his right thigh. Per the patient he was started on Bactrim however that didn't work at all in terms of relieving his pain and swelling. He has taken 3 doxycycline that he had left over from last time and that seems to of helped. He has blistering on the right thigh as well. 07/14/18; the erythema on his right thigh has gotten better with doxycycline that he is finishing. 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 Presumably this cellulitis in the thigh was not related to that although I think this is  a bit concerning going forward. He still has an area on the right lateral calf the blister on the right medial calf just below the knee that was discussed above. On the left 2 small open areas left medial and left lateral. Edema control is adequate. He is using his compression pumps twice a day 07/20/18; continued improvement in the condition of both legs especially the edema in his bilateral thighs. He tells me he is been losing weight through a combination of diet and exercise. He is using his compression pumps twice a day. So overall she made to the remaining wounds 07/27/2018; continued improvement in condition of both legs. His edema is well controlled. The area on the right lateral leg is just about closed he had one blisters show up on the medial left upper calf. We have him in 4 layer compression. He is going on a 10-day trip to [place], [place]  and [place]. He will be driving. He wants to wear Unna boots because of the lessening amount of constriction. He will not use compression pumps while he is away 08/05/18 on evaluation today patient actually appears to be doing decently well all things considered in regard to his bilateral lower extremities. The worst ulcer is actually only posterior aspect of his left lower extremity with a four layer compression wrap cut into his leg a couple weeks back. He did have a trip and actually had [employer] for the trip that he is worn since he was last here. Nonetheless he feels like the [employer] actually do better for him his swelling is up a little bit but he also with his trip was not taking his Lasix on a regular set schedule like he was supposed to be. He states that obviously the reason being that he cannot drive and keep going without having to urinate too frequently which makes it difficult. He did not have his pumps with him while he was away either which I think also maybe playing a role here too. 08/13/2018; the patient only has a small open wound on the right lateral calf which is a big improvement in the last month or 2. He also has the area posteriorly just below the posterior fossa on the left which I think was a wrap injury from several weeks ago. He has no current evidence of cellulitis. He tells me he is back into his compression pumps twice a day. He also tells me that while he was at the laundromat somebody stole a section of his extremitease stockings 08/20/2018; back in the clinic with a much improved state. He only has small areas on the right lateral mid calf which is just about healed. This was is more substantial area for quite a prolonged period of time. He has a small open area on the left anterior tibia. The area on the posterior calf just below the popliteal fossa is closed today. He is using his compression pumps twice a day 08/28/2018; patient has no open wound  on the right leg. He has a smattering of open areas on the calf with some weeping lymphedema. More problematically than that it looks as though his wraps of slipped down in his usual he has very angry upper area of edema just below the right medial knee and on the right lateral calf. He has no open area on his feet. The patient is traveling to [facility] next week. I will send him in an antibiotic. We will continue to  wrap the right leg. We ordered extremitease stockings for him last week and I plan to transition the right leg to a stocking when he gets home which will be in 10 days time. As usual he is very reluctant to take his pumps with him when he travels 09/07/2018; patient returns from [facility]. He shows me a picture of his left leg in the mid part of his trip last week with intense fire engine erythema. The picture look bad enough I would have considered sending him to the hospital. Instead he went to the wound care center in [facility] They did not prescribe him antibiotics but he did take some doxycycline he had leftover from a previous visit. I had given him trimethoprim sulfamethoxazole before he left this did not work according to the patient. This is resulted in some improvement fortunately. He comes back with a large wound on the left posterior calf. Smaller area on the left anterior tibia. Denuded blisters on the dorsal left foot over his toes. Does not have much in the way of wounds on the right leg although he does have a very tender area on the right posterior area just below the popliteal fossa also suggestive of infection. He promises me he is back on his pumps twice a day 09/15/2018; the intense cellulitis in his left lower calf is a lot better. The wound area on the posterior left calf is also so better. However he has reasonably extensive wounds on the dorsal aspect of his second and third toes and the proximal foot just at the base of the toes. There is  nothing open on the right leg 09/22/2018; the patient has excellent edema control in his legs bilaterally. He is using his external compression pumps twice a day. He has no open area on the right leg and only the areas in the left foot dorsally second and third toe area on the left side. He does not have any signs of active cellulitis. 10/06/2018; the patient has good edema control bilaterally. He has no open wound on the right leg. There is a blister in the posterior aspect of his left calf that we had to deal with today. He is using his compression pumps twice a day. There is no signs of active cellulitis. We have been using silver alginate to the wound areas. He still has vulnerable areas on the base of his left first second toes dorsally He has a his extremities stockings and we are going to transition him today into the stocking on the right leg. He is cautioned that he will need to continue to use the compression pumps twice a day. If he notices uncontrolled edema in the right leg he may need to go to 3 times a day. 10/13/2018; the patient came in for a nurse check on Friday he has a large flaccid blister on the right medial calf just below the knee. We unroofed this. He has this and a new area underneath the posterior mid calf which was undoubtedly a blister as well. He also has several small areas on the right which is the area we put his extremities stocking on. 10/19/2018; the patient went to see infectious disease this morning I am not sure if that was a routine follow-up in any case the doxycycline I had given him was discontinued and started on linezolid. He has not started this. It is easy to look at his left calf and the inflammation and think this is cellulitis however  he is very tender in the tissue just below the popliteal fossa and I have no doubt that there is infection going on here. He states the problem he is having is that with the compression pumps the edema goes down and then  starts walking the wrap falls down. We will see if we can adhere this. He has 1 or 2 minuscule open areas on the right still areas that are weeping on the posterior left calf, the base of his left second and third toes 10/26/18; back today in clinic with quite of skin breakdown in his left anterior leg. This may have been infection the area below the popliteal fossa seems a lot better however tremendous epithelial loss on the left anterior mid tibia area over quite inexpensive tissue. He has 2 blisters on the right side but no other open wound here. 10/29/2018; came in urgently to see [place] today and we worked him in for review. He states that the 4 layer compression on the right leg caused pain he had to cut it down to roughly his mid calf this caused swelling above the wrap and he has blisters and skin breakdown today. As a result of the pain he has not been using his pumps. Both legs are a lot more edematous and there is a lot of weeping fluid. 11/02/18; arrives in clinic with continued difficulties in the right leg> left. Leg is swollen and painful. multiple skin blisters and new open areas especially laterally. He has not been using his pumps on the right leg. 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 He is not systemically unwell. 11/09/2018; the patient claims he is being compliant with his pumps. He is finished the doxycycline I gave him last week. Culture I did of the wound on the right lateral leg showed a few very resistant methicillin staph aureus. This was resistant to doxycycline. Nevertheless he states the pain in the leg is a lot better which makes me wonder if the cultured organism was not really what was causing the problem nevertheless this is a very dangerous organism to be culturing out of any wound. His right leg is still a lot larger than the left. He is using an Radio broadcast assistant on this area, he blames a 4-layer compression for causing the  original skin breakdown which I doubt is true however I cannot talk him out of it. We have been using silver alginate to all of these areas which were initially blisters 11/16/2018; patient is being compliant with his external compression pumps at twice a day. Miraculously he arrives in clinic today with absolutely no open wounds. He has better edema control on the left where he has been using 4 layer compression versus wound of wounds on the right and I pointed this out to him. There is no inflammation in the skin in his lower legs which is also somewhat unusual for him. There is no open wounds on the dorsal left foot. He has extremitease stockings at home and I have asked him to bring these in next week. 11/25/18 patient's lower extremity on examination today on the left appears for the most part to be wound free. He does have an open wound on the lateral aspect of the right lower extremity but this is minimal compared to what I've seen in past. He does request that we go ahead and wrap the left leg as well even though there's nothing open just so hopefully it will  not reopen in short order. 1/28; patient has superficial open wounds on the right lateral calf left anterior calf and left posterior calf. His edema control is adequate. 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 We have been using silver alginate as the primary dressing. He claims compliance with his compression pumps 2/4; patient has superficial open wounds on numerous areas of his left calf and again one on the left dorsal foot. The areas on the right lateral calf have healed. The cellulitis that I gave him doxycycline for last week is also resolved this was mostly on the left anterior calf just below the tibial tuberosity. His edema looks fairly well-controlled. He tells me he went to see his primary doctor today and had blood work ordered 2/11; once  again he has several open areas on the left calf left tibial area. Most of these are small and appear to have healthy granulation. He does not have anything open on the right. The edema and control in his thighs is pretty good which is usually a good indication he has been using his pumps as requested. 2/18; he continues to have several small areas on the left calf and left tibial area. Most of these are small healthy granulation. We put him in his stocking on the right leg last week and he arrives with a superficial open area over the right upper tibia and a fairly large area on the right lateral tibia in similar condition. His edema control actually does not look too bad, he claims to be using his compression pumps twice a day 2/25. Continued small areas on the left calf and left tibial area. New areas especially on the right are identified just below the tibial tuberosity and on the right upper tibia itself. There are also areas of weeping edema fluid even without an obvious wound. He does not have a considerable degree of lymphedema but clearly there is more edema here than his skin can handle. He states he is using the pumps twice a day. We have an Unna boot on the right and 4 layer compression on the left. 3/3; he continues to have an area on the right lateral calf and right posterior calf just below the popliteal fossa. There is a fair amount of tenderness around the wound on the popliteal fossa but I did not see any evidence of cellulitis, could just be that the wrap came down and rubbed in this area. ooHe does not have an open area on the left leg however there is an area on the left dorsal foot at the base of the third toe ooWe have been using silver alginate to all wound areas 3/10; he did not have an open area on his left leg last time he was here a week ago. T oday he arrives with a horizontal wound just below the tibial tuberosity and an area on the left lateral calf. He has intense  erythema and tenderness in this area. The area is on the right lateral calf and right posterior calf better than last week. We have been using silver alginate as usual 3/18 - Patient returns with 3 small open areas on left calf, and 1 small open area on right calf, the skin looks ok with no significant erythema, he continues the UNA boot on right and 4 layer compression on left. The right lateral calf wound is closed , the right posterior is small  area. we will continue silver alginate to the areas. 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 He is been tolerating the dressing changes without complication. Fortunately she has made excellent improvement in regard to the overall status of his wounds. Unfortunately every time we cease wrapping him he ends up reopening in causing more significant issues at that point. Again I'm unsure of the best direction to take although I think the lymphedema clinic may be appropriate for him. 02/03/19 on evaluation today patient appears to be doing well in regard to the wounds that we saw him for last week unfortunately he has a new area on the proximal portion of his right medial/posterior lower extremity where the wrap somewhat slowed down and caused swelling and a blister to rub and open. Unfortunately this is the only opening that he has on either leg at this point. 02/17/19 on evaluation today patient's bilateral lower extremities appear to be doing well. He still completely healed in regard to the left lower extremity. In regard to the right lower extremity the area where the wrap and slid down and caused the blister still seems to be slightly open although this is dramatically better than during the last evaluation two weeks ago. I'm very pleased with the way this stands  overall. 03/03/19 on evaluation today patient appears to be doing well in regard to his right lower extremity in general although he did have a new blister open this does not appear to be showing any evidence of active infection at this time. Fortunately there's No fevers, chills, nausea, or vomiting noted at this time. Overall I feel like he is making good progress it does feel like that the right leg will we perform the [employer] seems to do with a bit better than three layer wrap on the left which slid down on him. We may switch to doing bilateral in the book wraps. 5/4; I have not seen Mr. Halberg in quite some time. According to our case manager he did not have an open wound on his left leg last week. He had 1 remaining wound on the right posterior medial calf. He arrives today with multiple openings on the left leg probably were blisters and/or wrap injuries from Unna boots. I do not think the Unna boot's will provide adequate compression on the left. I am also not clear about the frequency he is using the compression pumps. 03/17/19 on evaluation today patient appears to be doing excellent in regard to his lower extremities compared to last week's evaluation apparently. He had gotten significantly worse last week which is unfortunate. The [employer] wrap on the left did not seem to do very well for him at all and in fact it didn't control his swelling significantly enough he had an additional outbreak. Subsequently we go back to the four layer compression wrap on the left. This is good news. At least in that he is doing better and the wound seem to be killing him. He still has not heard anything from the lymphedema clinic. 03/24/19 on evaluation today patient actually appears to be doing much better in regard to his bilateral lower Trinity as compared to last week when I saw him. Fortunately there's no signs of active infection at this time. He has been tolerating the dressing changes without  complication. Overall I'm extremely pleased with the progress and appearance in general. 04/07/19 on evaluation today patient appears to be doing well in regard to his bilateral lower extremities. His swelling is significantly down from where it was previous. With that being said he does have a couple blisters still open at this point but fortunately nothing that seems to be too severe and again the majority of the larger openings has healed at this time. 04/14/19 on evaluation today patient actually appears to be doing quite well in regard to his bilateral lower extremities in fact I'm not even sure there's anything significantly open at this time at any site. Nonetheless he did have some trouble with these wraps where they are somewhat irritating him secondary to the fact that he has noted that the graph wasn't too close down to the end of this foot in a little bit short as well up to his knee. Otherwise things seem to be doing quite well. 04/21/19 upon evaluation today patient's wound bed actually showed evidence of being completely healed in regard to both lower extremities which is excellent news. There does not appear to be any signs of active infection which is also good news. I'm very pleased in this regard. No fevers, chills, nausea, or vomiting noted at this time. 04/28/19 on evaluation today patient appears to be doing a little bit worse in regard to both lower extremities on the left mainly due to the fact that when he went infection disease the wrap was not wrapped quite high enough he developed a blister above this. On the right he is a small open area of nothing too significant but again this is continuing to give him some trouble he has been were in the Velcro compression that he has at home. 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 He's been tolerating the dressing changes without complication. Fortunately there's no signs of  active infection at this time. No fevers, chills, nausea, or vomiting noted at this time. We have been trying to get an appointment with her lymphedema clinic in [place] but unfortunately nobody can get them on phone with not been able to even fax information over the patient likewise is not been able to get in touch with them. Overall I'm not sure exactly what's going on here with to reach out again today. 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 Still having a lot of drainage unfortunately. He tells me especially in the left but even on the right. There's no signs of active infection which is good news we've been using so ratcheted up to this point. 05/19/19 on evaluation today patient actually appears to be doing quite well with regard to his left lower extremity which is great news. Fortunately in regard to the right lower extremity has an issues with his wrap and he subsequently did remove this from what I'm understanding. Nonetheless long story short is what he had rewrapped once he removed it subsequently had maggots underneath this wrap whenever he came in for evaluation today. With that being said they were obviously completely cleaned away by the nursing staff. The visit today which is excellent news. However he does appear to potentially have some infection around the right ankle region where the maggots were located as well. He will likely require anabiotic therapy today. 05/26/19 on evaluation today patient actually appears  to be doing much better in regard to his bilateral lower extremities. I feel like the infection is under much better control. With that being said there were maggots noted when the wrap was removed yet again today. Again this could have potentially been left over from previous although at this time there does not appear to be any signs of significant drainage there  was obviously on the wrap some drainage as well this contracted gnats or otherwise. Either way I do not see anything that appears to be doing worse in my pinion and in fact I think his drainage has slowed down quite significantly likely mainly due to the fact to his infection being under better control. 06/02/2019 on evaluation today patient actually appears to be doing well with regard to his bilateral lower extremities there is no signs of active infection at this time which is great news. With that being said he does have several open areas more so on the right than the left but nonetheless these are all significantly better than previously noted. 06/09/2019 on evaluation today patient actually appears to be doing well. His wrap stayed up and he did not cause any problems he had more drainage on the right compared to the left but overall I do not see any major issues at this time which is great news. 06/16/2019 on evaluation today patient appears to be doing excellent with regard to his lower extremities the only area that is open is a new blister that can have opened as of today on the medial ankle on the left. Other than this he really seems to be doing great I see no major issues at this point. 06/23/2019 on evaluation today patient appears to be doing quite well with regard to his bilateral lower extremities. In fact he actually appears to be almost completely healed there is a small area of weeping noted of the right lower extremity just above the ankle. Nonetheless fortunately there is no signs of active infection at this time which is good news. No fevers, chills, nausea, vomiting, or diarrhea. 8/24; the patient arrived for a nurse visit today but complained of very significant pain in the left leg and therefore I was asked to look at this. Noted that he did not have an open area on the left leg last week nevertheless this was wrapped. The patient states that he is not been able to put his  compression pumps on the left leg because of the discomfort. He has not been systemically unwell 06/30/2019 on evaluation today patient unfortunately despite being excellent last week is doing much worse with regard to his left lower extremity today. In fact he had to come in for a nurse on Monday where his left leg had to be rewrapped due to excessive weeping Dr. Leanord Hawking placed him on doxycycline at that point. Fortunately there is no signs of active infection Systemically at this time which is good news. 07/07/2019 in regard to the patient's wounds today he actually seems to be doing well with his right lower extremity there really is nothing open or draining at this point this is great news. Unfortunately the left lower extremity is given him additional trouble at this time. There does not appear to be any signs of active infection nonetheless he does have a lot of edema and swelling noted at this point as well as blistering all of which has led to a much more poor appearing leg at this time compared to where it was 2  weeks ago when it was almost completely healed. Obviously this is a little discouraging for the patient. He is try to contact the lymphedema clinic in [place] he has not been able to get through to them. 07/14/2019 on evaluation today patient actually appears to be doing slightly better with regard to his left lower extremity ulcers. Overall I do feel like at least at the top of the wrap that we have been placing this area has healed quite nicely and looks much better. The remainder of the leg is showing signs of improvement. Unfortunately in the thigh area he still has an open region on the left and again on the right he has been utilizing just a Band-Aid on an area that also opened on the thigh. Again this is an area that were not able to wrap although we did do an Ace wrap to provide some compression that something that obviously is a little less effective than the compression wraps  we have been using on the lower portion of the leg. He does have an appointment with the lymphedema clinic in [facility] on Friday. 07/21/2019 on evaluation today patient appears to be doing better with regard to his lower extremity ulcers. He has been tolerating the dressing changes without complication. Fortunately there is no signs of active infection at this time. No fevers, chills, nausea, vomiting, or diarrhea. I did receive the paperwork from the physical therapist at the lymphedema clinic in [place]. Subsequently I signed off on that this morning and sent that back to him for further progression with the treatment plan. 07/28/2019 on evaluation today patient appears to be doing very well with regard to his right lower extremity where I do not see any open wounds at this point. Fortunately he is feeling great as far as that is concerned as well. In regard to the left lower extremity he has been having issues with still several areas of weeping and edema although the upper leg is doing better his lower leg still I think is going require the compression wrap at this time. No fevers, chills, nausea, vomiting, or diarrhea. 08/04/2019 on evaluation today patient unfortunately is having new wounds on the right lower extremity. Again we have been using Unna boot wrap on that side. We switched him to using his juxta lite wrap at home. With that being said he tells me he has been using it although his legs extremely swollen and to be honest really does not appear that he has been. I cannot know that for sure however. Nonetheless he has multiple new wounds on the right lower extremity at this time. Obviously we will have to see about getting this rewrapped for him today. 08/11/2019 on evaluation today patient appears to be doing fairly well with regard to his wounds. He has been tolerating the dressing changes including the compression wraps without complication. He still has a lot of edema in his  upper thigh regions bilaterally he is supposed to be seeing the lymphedema clinic on the 15th of this month once his wraps arrive for the upper part of his legs. 08/18/2019 on evaluation today patient appears to be doing well with regard to his bilateral lower extremities at this point. He has been tolerating the dressing changes without complication. Fortunately there is no signs of active infection which is also good news. He does have a couple weeping areas on the first and second toe of the right foot he also has just a small area on the left foot  upper leg and a small area on the left lower leg but overall he is doing quite well in my opinion. He is supposed to be getting his wraps shortly in fact tomorrow and then subsequently is seeing the lymphedema clinic next Wednesday on the 21st. Of note he is also leaving on the 25th to go on vacation for a week to the beach. For that reason and since there is some uncertainty about what there can be doing at lymphedema clinic next Wednesday I am get a make an appointment for next Friday here for [place] to see what we need to do for him prior to him leaving for vacation. 10/23; patient arrives in considerable pain predominantly in the upper posterior calf just distal to the popliteal fossa also in the wound anteriorly above the major wound. This is probably cellulitis and he has had this recurrently in the past. He has no open wound on the right side and he has had an Radio broadcast assistant in that area. Finally I note that he has an area on the left posterior calf which by enlarge is mostly epithelialized. This protrudes beyond the borders of the surrounding skin in the setting of dry scaly skin and lymphedema. The patient is leaving for [facility] on Sunday. Per his longstanding pattern, he will not take his compression pumps with him predominantly out of fear that they will be stolen. He therefore asked that we put a Unna boot back on the right leg. He will also  contact the wound care center in [facility] to see if they can change his dressing in the mid week. 11/3; patient returned from his vacation to [facility]. He was seen on 1 occasion at their wound care center. They did a 2 layer compression system as they did not have our 4-layer wrap. I am not completely certain what they put on the wounds. They did not change the Unna boot on the right. The patient is also seeing a lymphedema specialist physical therapist in [place]. It appears that he has some compression sleeve for his thighs which indeed look quite a bit better than I am used to seeing. He pumps over these with his external compression pumps. 11/10; the patient has a new wound on the right medial thigh otherwise there is no open areas on the right. He has an area on the left leg posteriorly anteriorly and medially and an area over the left second toe. We have been using silver alginate. He thinks the injury on his thigh is secondary to friction from the compression sleeve he has. 11/17; the patient has a new wound on the right medial thigh last week. He thinks this is because he did not have a underlying stocking for his thigh juxta lite apparatus. He now has this. The area is fairly large and somewhat angry but I do not think he has underlying cellulitis. ooHe has a intact blister on the right anterior tibial area. ooSmall wound on the right great toe dorsally ooSmall area on the medial left calf. 11/30; the patient does not have any open areas on his right leg and we did not take his juxta lite stocking off. However he states that on Friday his compression wrap fell down lodging around his upper mid calf area. As usual this creates a lot of problems for him. He called urgently today to be seen for a nurse visit however the nurse visit turned into a provider visit because of extreme erythema and pain in  the left anterior tibia extending laterally and posteriorly. The area that is  problematic is extensive 10/06/2019 upon evaluation today patient actually appears to be doing poorly in regard to his left lower extremity. He Dr. Leanord Hawking did place him on doxycycline this past Monday apparently due to the fact that he was doing much worse in regard to this left leg. Fortunately the doxycycline does seem to be helping. Unfortunately we are still having a very difficult time getting his edema under any type of control in order to anticipate discharge at some point. The only way were really able to control his lymphedema really is with compression wraps and that has only even seemingly temporary. He has been seeing a lymphedema clinic they are trying to help in this regard but still this has been somewhat frustrating in general for the patient. 10/13/19 on evaluation today patient appears to be doing excellent with regard to his right lower extremity as far as the wounds are concerned. His swelling is still quite extensive unfortunately. He is still having a lot of drainage from the thigh areas bilaterally which is unfortunate. He's been going to lymphedema clinic but again he still really does not have this edema under control as far as his lower extremities are concern. With regard to his left lower extremity this seems to be improving and I do believe the doxycycline has been of benefit for him. He is about to complete the doxycycline. 10/20/2019 on evaluation today patient appears to be doing poorly in regard to his bilateral lower extremities. More in the right thigh he has a lot of irritation at this site unfortunately. In regard to the left lower extremity the wrap was not quite as high it appears and does seem to have caused him some trouble as well. Fortunately there is no evidence of systemic infection though he does have some blue-green drainage which has me concerned for the possibility of Pseudomonas. He tells me he is previously taking Cipro without complications and he really  does not care for Levaquin however due to some of the side effects he has. He is not allergic to any medications specifically antibiotics that were aware of. 10/27/2019 on evaluation today patient actually does appear to be for the most part doing better when compared to last week's evaluation. With that being said he still has multiple open wounds over the bilateral lower extremities. He actually forgot to start taking the Cipro and states that he still has the whole bottle. He does have several new blisters on left lower extremity today I think I would recommend he go ahead and take the Cipro based on what I am seeing at this point. 12/30-Patient comes at 1 week visit, 4 layer compression wraps on the left and Unna boot on the right, primary dressing Xtrasorb and silver alginate. Patient is taking his Cipro and has a few more days left probably 5-6, and the legs are doing better. He states he is using his compressions devices which I believe he has 11/10/2019 on evaluation today patient actually appears to be much better than last time I saw him 2 weeks ago. His wounds are significantly improved and overall I am very pleased in this regard. Fortunately there is no signs of active infection at this time. He is just a couple of days away from completing Cipro. Overall his edema is much better he has been using his lymphedema pumps which I think is also helping at this point. 11/17/2019 on evaluation today patient  appears to be doing excellent in regard to his wounds in general. His legs are swollen but not nearly as much as they have been in the past. Fortunately he is tolerating the compression wraps without complication. No fevers, chills, nausea, vomiting, or diarrhea. He does have some erythema however in the distal portion of his right lower extremity specifically around the forefoot and toes there is a little bit of warmth here as well. 11/24/2019 on evaluation today patient appears to be doing well  with regard to his right lower extremity I really do not see any open wounds at this point. His left lower extremity does have several open areas and his right medial thigh also is open. Other than this however overall the patient seems to be making good progress and I am very pleased at this point. 12/01/2019 on evaluation today patient appears to be doing poorly at this point in regard to his left lower extremity has several new blisters despite the fact that we have him in compression wraps. In fact he had a 4-layer compression wrap, his upper thigh wrapped from lymphedema clinic, and a juxta light over top of the 4 layer compression wrap the lymphedema clinic applied and despite all this he still develop blisters underneath. Obviously this does have me concerned about the fact that unfortunately despite what we are doing to try to get wounds healed he continues to have new areas arise I do not think he is ever good to be at the point where he can realistically just use wraps at home to keep things under control. Typically when we heal him it takes about 1-2 days before he is back in the clinic with severe breakdown and blistering of his lower extremities bilaterally. This is happened numerous times in the past. Unfortunately I think that we may need some help as far as overall fluid overload to kind of limit what we are seeing and get things under better control. 12/08/2019 on evaluation today patient presents for follow-up concerning his ongoing bilateral lower extremity edema. Unfortunately he is still having quite a bit of swelling the compression wraps are controlling this to some degree but he did see Dr. [name] his cardiologist I do have that available for review today as far as the appointment was concerned that was on 12/06/2019. Obviously that she has been 2 days ago. The patient states that he is only been taking the Lasix 80 mg 1 time a day he had told me previously he was taking this twice a  day. Nonetheless Dr. [name] recommended this be up to 80 mg 2 times a day for the patient as he did appear to be fluid overloaded. With that being said the patient states he did this yesterday and he was unable to go anywhere or do anything due to the fact that he was constantly having to urinate. Nonetheless I think that this is still good to be something that is important for him as far as trying to get his edema under control at all things that he is going to be able to just expect his wounds to get under control and things to be better without going through at least a period of time where he is trying to stabilize his fluid management in general and I think increasing the Lasix is likely the first step here. It was also mentioned the possibility that the patient may require metolazone. With that being said he wanted to have the patient take Lasix twice a  day first and then reevaluating 2 months to see where things stand. 12/15/2019 upon evaluation today patient appears to be doing regard to his legs although his toes are showing some signs of weeping especially on the left at this point to some degree on the right. There does not appear to be any signs of active infection and overall I do feel like the compression wraps are doing well for him but he has not been able to take the Lasix at home and the increased dose that Dr. [name] recommended. He tells me that just not go to be feasible for him. Nonetheless I think in this case he should probably send a message to Dr. [name] in order to discuss options from the standpoint of possible admission to get the fluid off or otherwise going forward. 12/22/2019 upon evaluation today patient appears to be doing fairly well with regard to his lower extremities at this point. In fact he would be doing excellent if it was not for the fact that his right anterior thigh apparently had an allergic reaction to adhesive tape that he used. The wound itself that we have  been monitoring actually appears to be healed. There is a lot of irritation at this point. 12/29/2019 upon evaluation today patient appears to be doing well in regard to his lower extremities. His left medial thigh is open and somewhat draining today but this is the only region that is open the right has done much better with the treatment utilizing the steroid cream that I prescribed for him last week. Overall I am pleased in that regard. Fortunately there is no signs of active infection at this time. No fevers, chills, nausea, vomiting, or diarrhea. 01/05/2020 upon evaluation today patient appears to be doing more poorly in regard to his right lower extremity at this point upon evaluation today. Unfortunately he continues to have issues in this regard and I think the biggest issue is controlling his edema. This obviously is not very well controlled at this point is been recommended that he use the Lasix twice a day but he has not been able to do that. Unfortunately I think this is leading to an issue where honestly he is not really able to effectively control his edema and therefore the wounds really are not doing significantly better. I do not think that he is going to be able to keep things under good control unless he is able to control his edema much better. I discussed this again in great detail with him today. 01/12/2020 good news is patient actually appears to be doing quite well today at this point. He does have an appointment with lymphedema clinic tomorrow. His legs appear healed and the toe on the left is almost completely healed. In general I am very pleased with how things stand at this point. 01/19/2020 upon evaluation today patient appears to actually be doing well in regard to his lower extremities there is nothing open at this point. Fortunately he has done extremely well more recently. Has been seeing lymphedema clinic as well. With that being said he has Velcro wraps for his lower legs  as well as his upper legs. The only wound really is on his toe which is the right great toe and this is barely anything even there. With all that being said I think it is good to be appropriate today to go ahead and switch him over to the Velcro compression wraps. 01/26/2020 upon evaluation today patient appears to be doing worse  with regard to his lower extremities after last week switch him to Velcro compression wraps. Unfortunately he lasted less than 24 hours he did not have the sock portion of his Velcro wrap on the left leg and subsequently developed a blister underneath the Velcro portion. Obviously this is not good and not what we were looking for at this point. He states the lymphedema clinic did tell him to wear the wrap for 23 hours and take him off for 1 I am okay with that plan but again right now we got a get things back under control again he may have some cellulitis noted as well. 02/02/2020 upon evaluation today patient unfortunately appears to have several areas of blistering on his bilateral lower extremities today mainly on the feet. His legs do seem to be doing somewhat better which is good news. Fortunately there is no evidence of active infection at this time. No fevers, chills, nausea, vomiting, or diarrhea. 02/16/2020 upon evaluation today patient appears to be doing well at this time with regard to his legs. He has a couple weeping areas on his toes but for the most part everything is doing better and does appear to be sealed up on his legs which is excellent news. We can continue with wrapping him at this point as he had every time we discontinue the wraps he just breaks out with new wounds. There is really no point in is going forward with this at this point. 03/08/2020 upon evaluation today patient actually appears to be doing quite well with regard to his lower extremity ulcers. He has just a very superficial and really almost nonexistent blister on the left lower extremity he  has in general done very well with the compression wraps. With that being said I do not see any signs of infection at this time which is good news. 03/29/2020 upon evaluation today patient appears to be doing well with regard to his wounds currently except for where he had several new areas that opened up due to some of the wrap slipping and causing him trouble. He states he did not realize they had slipped. Nonetheless he has a 1 area on the right and 3 new areas on the left. Fortunately there is no signs of active infection at this time which is great news. 04/05/2020 upon evaluation today patient actually appears to be doing quite well in general in regard to his legs currently. Fortunately there is no signs of active infection at this time. No fevers, chills, nausea, vomiting, or diarrhea. He tells me next week that he will actually be seen in the lymphedema clinic on Thursday at 10 AM I see him on Wednesday next week. 04/12/2020 upon evaluation today patient appears to be doing very well with regard to his lower extremities bilaterally. In fact he does not appear to have any open wounds at this point which is good news. Fortunately there is no signs of active infection at this time. No fevers, chills, nausea, vomiting, or diarrhea. 04/19/2020 upon evaluation today patient appears to be doing well with regard to his wounds currently on the bilateral lower extremities. There does not appear to be any signs of active infection at this time. Fortunately there is no evidence of systemic infection and overall very pleased at this point. Nonetheless after I held him out last week he literally had blisters the next morning already which swelled up with him being right back here in the clinic. Overall I think that he is just  not can be able to be discharged with his legs the way they are he is much to volume overloaded as far as fluid is concerned and that was discussed with him today of also discussed this but  should try the clinic nurse manager as well as Dr. Leanord Hawking. 04/26/2020 upon evaluation today patient appears to be doing better with regard to his wounds currently. He is making some progress and overall swelling is under good control with the compression wraps. Fortunately there is no evidence of active infection at this time. 05/10/2020 on evaluation today patient appears to be doing overall well in regard to his lower extremities bilaterally. He is Tolerating the compression wraps without complication and with what we are seeing currently I feel like that he is making excellent progress. There is no signs of active infection at this time. 05/24/2020 upon evaluation today patient appears to be doing well in regard to his legs. The swelling is actually quite a bit down compared to where it has been in the past. Fortunately there is no sign of active infection at this time which is also good news. With that being said he does have several wounds on his toes that have opened up at this point. 05/31/2020 upon evaluation today patient appears to be doing well with regard to his legs bilaterally where he really has no significant fluid buildup at this point overall he seems to be doing quite well. Very pleased in this regard. With regard to his toes these also seem to be drying up which is excellent. We have continue to wrap him as every time we tried as a transition to the juxta light wraps things just do not seem to get any better. 06/07/2020 upon evaluation today patient appears to be doing well with regard to his right leg at this point. Unfortunately left leg has a lot of blistering he tells me the wrap started to slide down on him when he tried to put his other Velcro wrap over top of it to help keep things in order but nonetheless still had some issues. 06/14/2020 on evaluation today patient appears to be doing well with regard to his lower extremity ulcers and foot ulcers at this point. I feel like  everything is actually showing signs of improvement which is great news overall there is no signs of active infection at this time. No fevers, chills, nausea, vomiting, or diarrhea. 06/21/2020 on evaluation today patient actually appears to be doing okay in regard to his wounds in general. With that being said the biggest issue I see is on his right foot in particular the first and second toe seem to be doing a little worse due to the fact this is staying very wet. I think he is probably getting need to change out his dressings a couple times in between each week when we see him in regard to his toes in order to keep this drier based on the location and how this is proceeding. 06/28/2020 on evaluation today patient appears to be doing a little bit more poorly overall in regard to the appearance of the skin I am actually somewhat concerned about the possibility of him having a little bit of an infection here. We discussed the course of potentially giving him a doxycycline prescription which he is taken previously with good result. With that being said I do believe that this is potentially mild and at this point easily fixed. I just do not want anything to get any worse.  07/12/2020 upon evaluation today patient actually appears to be making some progress with regard to his legs which is great news there does not appear to be any evidence of active infection. Overall very pleased with where things stand. 07/26/2020 upon evaluation today patient appears to be doing well with regard to his leg ulcers and toe ulcers at this point. He has been tolerating the compression wraps without complication overall very pleased in this regard. 08/09/2020 upon evaluation today patient appears to be doing well with regard to his lower extremities bilaterally. Fortunately there is no signs of active infection overall I am pleased with where things stand. 08/23/2020 on evaluation today patient appears to be doing well with  regard to his wound. He has been tolerating the dressing changes without complication. Fortunately there is no signs of active infection at this time. Overall his legs seem to be doing quite well which is great news and I am very pleased in that regard. No fevers, chills, nausea, vomiting, or diarrhea. 09/13/2020 upon evaluation today patient appears to be doing okay in regard to his lower extremities. He does have a fairly large blister on the right leg which I did remove the blister tissue from today so we can get this to dry out other than that however he seems to be doing quite well. There is no signs of active infection at this time. 09/27/2020 upon evaluation today patient appears to actually be doing some better in regard to his right leg. Fortunately signs of active infection at this time which is great news. No fevers, chills, nausea, vomiting, or diarrhea. 10/04/2020 upon evaluation today patient actually appears to be showing signs of improvement which is great news with regard to his leg ulcers. Fortunately there is no signs of active infection which is great news he is still taking the antibiotics currently. No fevers, chills, nausea, vomiting, or diarrhea. 10/18/2020 on evaluation today patient appears to be doing well with regard to his legs currently. He has been tolerating the dressing changes including the wraps without complication. Fortunately there is no signs of active infection at this time. No fevers, chills, nausea, vomiting, or diarrhea. 10/25/2020 upon evaluation today patient appears to be doing decently well in regard to his wounds currently. He has been tolerating the dressing changes without complication. Overall I feel like he is making good progress albeit slow. Again this is something we can have to continue to wrap for some time to come most likely. 11/08/2020 upon evaluation today patient appears to be doing well with regard to his wounds currently. He has been  tolerating the dressing changes without complication is not currently on any antibiotics and he does not appear to show any signs of infection. He does continue to have a lot of drainage on the right leg not too severe but nonetheless this is very scattered. On the left leg this is looking to be much improved overall. 11/15/2020 upon evaluation today patient appears to be doing better with regard to his legs bilaterally. Especially the right leg which was much more significant last week. There does not appear to be any signs of active infection which is great news. No fevers, chills, nausea, vomiting, or diarrhea. 11/23/2019 upon evaluation today patient appears to be doing poorly still in regard to his lower extremities bilaterally. Unfortunately his right leg in particular appears to be doing much more poorly there is no signs really of infection this is not warm to touch but he does have a  lot of drainage and weeping unfortunately. With that reason I do believe that we may need to initiate some treatment here to try to help calm down some of the swelling of the right leg. I think switching to a 4-layer compression wrap would be beneficial here. The patient is in agreement with giving this a try. 11/29/2020 upon evaluation today patient appears to be doing well currently in regard to his leg ulcers. I feel like the right leg is doing better he still has a lot of drainage but we do see some improvement here. The 4-layer compression wrap I think was helpful. 12/06/2020 upon evaluation today patient appears to be doing well with regard to his legs. In fact they seem to be doing about the best I have seen up to this point. Fortunately there is no signs of active infection at this time. No fevers, chills, nausea, vomiting, or diarrhea. 12/20/2020 upon evaluation today patient appears to be doing well at this time in regard to his legs. He is not having any significant draining which is great news. Fortunately  there is no signs of active infection at this time. No fevers, chills, nausea, vomiting, or diarrhea. 01/17/2021 upon evaluation today Travus actually appears to be doing excellent in regard to his legs. He has a few areas again that come and go as far as his toes are concerned but overall this is doing quite well. 01/31/2021 upon evaluation today patient appears to be doing well with regard to his legs. Fortunately there does not appear to be any signs of active infection which is great news. Overall he is still having significant edema despite the compression wraps basically the 4-layer compression wrap to just keep things under control there is really not much room for play. Objective Constitutional Obese and well-hydrated in no acute distress. Vitals Time Taken: 10:41 AM, Height: 70 in, Weight: 380.2 lbs, BMI: 54.5, Temperature: 97.5 F, Pulse: 75 bpm, Respiratory Rate: 22 breaths/min, Blood Pressure: 174/64 mmHg, Capillary Blood Glucose: 167 mg/dl. Respiratory normal breathing without difficulty. Psychiatric this patient is able to make decisions and demonstrates good insight into disease process. Alert and Oriented x 3. pleasant and cooperative. General Notes: Upon inspection patient's wound bed showed signs of good granulation epithelization at this point. There does not appear to be any signs of infection which is great news and overall I will continue to monitor the patient week by week. Obviously I think the biggest thing he needs to elevate his legs, uses pumps, and he really needs to be doing twice a day fluid pills although he is only doing this once a day. Integumentary (Hair, Skin) Wound #192 status is Open. Original cause of wound was Gradually Appeared. The date acquired was: 01/17/2021. The wound has been in treatment 2 weeks. The wound is located on the Left T Great. The wound measures 0cm length x 0cm width x 0cm depth; 0cm^2 area and 0cm^3 volume. There is no tunneling  or oe undermining noted. There is a none present amount of drainage noted. The wound margin is distinct with the outline attached to the wound base. There is no granulation within the wound bed. There is no necrotic tissue within the wound bed. Wound #193 status is Open. Original cause of wound was Gradually Appeared. The date acquired was: 01/31/2021. The wound is located on the Left T Second. oe The wound measures 2.6cm length x 1.3cm width x 0.1cm depth; 2.655cm^2 area and 0.265cm^3 volume. There is Fat Layer (Subcutaneous Tissue)  exposed. There is no tunneling or undermining noted. There is a medium amount of serosanguineous drainage noted. The wound margin is distinct with the outline attached to the wound base. There is large (67-100%) red, pink granulation within the wound bed. There is no necrotic tissue within the wound bed. 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 Post procedure Diagnosis Wound #: Same as Pre-Procedure There was a Four Layer Compression Therapy Procedure by Shawn Stall, RN. Post procedure Diagnosis Wound #: Same as Pre-Procedure Plan Follow-up Appointments: Return Appointment in 2 weeks. - MD Nurse Visit: - 1 week Bathing/ Shower/ Hygiene: May shower with protection but do not get wound dressing(s) wet. Edema Control - Lymphedema / SCD / Other: Lymphedema Pumps. Use Lymphedema pumps on leg(s) 2-3 times a day for 45-60 minutes. If wearing any wraps or hose, do not remove  them. Continue exercising as instructed. Elevate legs to the level of the heart or above for 30 minutes daily and/or when sitting, a frequency of: - throughout the day Avoid standing for long periods of time. Exercise regularly Non Wound Condition: Other Non Wound Condition Orders/Instructions: - lotion to both legs, 4 layer compression wraps right and left legs WOUND #193: - T Second Wound Laterality: Left oe Prim Dressing: KerraCel Ag Gelling Fiber Dressing, 2x2 in (silver alginate) Every Other Day/15 Days ary Discharge Instructions: Apply silver alginate to wound bed as instructed Secondary Dressing: Woven Gauze Sponges 2x2 in Every Other Day/15 Days Discharge Instructions: Apply over primary dressing as directed. Secured With: Insurance underwriter, Sterile 2x75 (in/in) Every Other Day/15 Days Discharge Instructions: Secure with stretch gauze as directed. 1. Would recommend currently that we continue with wound care measures as before with regard to the 4-layer compression wraps bilaterally we will use silver alginate to the open areas. 2. I am also can recommend he should continue to elevate his legs, uses pumps, and should be taken as fluid pills 2 times a day although is only doing it once a day. We will see patient back for reevaluation in 1 week here in the clinic. If anything worsens or changes patient will contact our office for additional recommendations. Electronic Signature(s) Signed: 01/31/2021 11:33:01 AM By: Lenda Kelp PA-C Entered By: Lenda Kelp on 01/31/2021 11:33:00 -------------------------------------------------------------------------------- SuperBill Details Patient Name: Date of Service: [name], [name] 01/31/2021 Medical Record Number: [id number] Patient Account Number: [id number] Date of Birth/Sex: Treating RN: 1951-03-24 (70 y.o. [name] Primary Care Provider: Nicoletta Ba Other Clinician: Referring Provider: Treating  Provider/Extender: Adele Dan in Treatment: 262 Diagnosis Coding ICD-10 Codes Code Description [phone] Non-pressure chronic ulcer of right calf limited to breakdown of skin L97.221 Non-pressure chronic ulcer of left calf limited to breakdown of skin 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 of bilateral lower extremity I89.0 Lymphedema, not elsewhere classified E11.622 Type 2 diabetes mellitus with other skin ulcer 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, unspecified L03.116 Cellulitis of left lower limb Facility Procedures CPT4: Code [id number] Description: 81 BILATERAL: Application of multi-layer venous compression system;  leg (below knee), including ankle and t. Modifier: Quantity: 1 Physician Procedures : CPT4 Code Description Modifier [id number] - WC PHYS LEVEL 3 - EST PT ICD-10 Diagnosis Description L97.211 Non-pressure chronic ulcer of right calf limited to breakdown of skin L97.221 Non-pressure chronic ulcer of left calf limited to breakdown of  skin 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 of bilateral lower extremity I89.0 Lymphedema, not elsewhere classified Quantity: 1 Electronic Signature(s) Signed: 01/31/2021 11:33:20 AM By: Lenda Kelp PA-C Entered By: Lenda Kelp on 01/31/2021 11:33:15

## 2021-02-02 DIAGNOSIS — Z1211 Encounter for screening for malignant neoplasm of colon: Secondary | ICD-10-CM

## 2021-02-02 HISTORY — DX: Encounter for screening for malignant neoplasm of colon: Z12.11

## 2021-02-07 ENCOUNTER — Other Ambulatory Visit: Payer: Self-pay

## 2021-02-07 ENCOUNTER — Encounter (HOSPITAL_BASED_OUTPATIENT_CLINIC_OR_DEPARTMENT_OTHER): Payer: Medicare Other | Attending: Physician Assistant | Admitting: Physician Assistant

## 2021-02-07 ENCOUNTER — Ambulatory Visit (INDEPENDENT_AMBULATORY_CARE_PROVIDER_SITE_OTHER): Payer: Medicare Other

## 2021-02-07 VITALS — Ht 69.0 in | Wt 393.0 lb

## 2021-02-07 DIAGNOSIS — E1140 Type 2 diabetes mellitus with diabetic neuropathy, unspecified: Secondary | ICD-10-CM | POA: Insufficient documentation

## 2021-02-07 DIAGNOSIS — Z1211 Encounter for screening for malignant neoplasm of colon: Secondary | ICD-10-CM

## 2021-02-07 DIAGNOSIS — I87333 Chronic venous hypertension (idiopathic) with ulcer and inflammation of bilateral lower extremity: Secondary | ICD-10-CM | POA: Insufficient documentation

## 2021-02-07 DIAGNOSIS — L97529 Non-pressure chronic ulcer of other part of left foot with unspecified severity: Secondary | ICD-10-CM | POA: Diagnosis present

## 2021-02-07 DIAGNOSIS — I890 Lymphedema, not elsewhere classified: Secondary | ICD-10-CM | POA: Diagnosis not present

## 2021-02-07 DIAGNOSIS — Z0000 Encounter for general adult medical examination without abnormal findings: Secondary | ICD-10-CM

## 2021-02-07 DIAGNOSIS — L97521 Non-pressure chronic ulcer of other 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 of skin: Secondary | ICD-10-CM | POA: Diagnosis not present

## 2021-02-07 DIAGNOSIS — E11621 Type 2 diabetes mellitus with foot ulcer: Secondary | ICD-10-CM | POA: Insufficient documentation

## 2021-02-07 NOTE — Progress Notes (Signed)
[name], [name] ([id number]) Visit Report for 02/07/2021 SuperBill Details Patient Name: Date of Service: [name], [name] 02/07/2021 Medical Record Number: [id number] Patient Account Number: [id number] Date of Birth/Sex: Treating RN: 1951-06-19 (70 y.o. 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 Electronic Signature(s) Signed: 02/07/2021 5:46:22 PM By: Lenda Kelp PA-C Signed: 02/07/2021 5:48:55 PM By: Shawn Stall Entered By: Shawn Stall on 02/07/2021 15:49:04

## 2021-02-07 NOTE — Progress Notes (Signed)
Subjective:   [name] is a 70 y.o. male who presents for an Initial Medicare Annual Wellness Visit.  I connected with Daryan today by telephone and verified that I am speaking with the correct person using two identifiers. Location patient: home Location provider: work Persons participating in the virtual visit: patient, Engineer, civil (consulting).    I discussed the limitations, risks, security and privacy concerns of performing an evaluation and management service by telephone and the availability of in person appointments. I also discussed with the patient that there may be a patient responsible charge related to this service. The patient expressed understanding and verbally consented to this telephonic visit.    Interactive audio and video telecommunications were attempted between this provider and patient, however failed, due to patient having technical difficulties OR patient did not have access to video capability.  We continued and completed visit with audio only.  Some vital signs may be absent or patient reported.   Time Spent with patient on telephone encounter: 35 minutes   Review of Systems     Cardiac Risk Factors include: advanced age (>11men, >24 women);male gender;diabetes mellitus;hypertension;obesity (BMI >30kg/m2);sedentary lifestyle     Objective:    Today's Vitals   02/07/21 0859  Weight: (!) 393 lb (178.3 kg)  Height: 5\' 9"  (1.753 m)   Body mass index is 58.04 kg/m.  Advanced Directives 02/07/2021 01/13/2018 02/12/2016  Does Patient Have a Medical Advance Directive? No No No  Would patient like information on creating a medical advance directive? Yes (MAU/Ambulatory/Procedural Areas - Information given) Yes (MAU/Ambulatory/Procedural Areas - Information given) Yes - Educational materials given    Current Medications (verified) Outpatient Encounter Medications as of 02/07/2021  Medication Sig  . arginine 500 MG tablet Take by mouth.  04/09/2021 b complex vitamins capsule Take  1 capsule by mouth every morning.  . butalbital-acetaminophen-caffeine (FIORICET WITH CODEINE) 50-325-40-30 MG capsule TAKE 1 TO 2 CAPSULES BY  MOUTH EVERY 6 HOURS AS  NEEDED FOR HEADACHE(S)  MANUFACTURER RECOMMENDS NOT EXCEEDING 6 CAPSULES/DAY  . cetirizine (ZYRTEC) 10 MG tablet Take 10 mg by mouth daily.  . CHROMIUM GTF PO Take by mouth daily.  . Coenzyme Q10 (CO Q 10) 10 MG CAPS Take by mouth. Reported on 02/19/2016  . diltiazem (TIAZAC) 240 MG 24 hr capsule TAKE 1 CAPSULE BY MOUTH  DAILY  . furosemide (LASIX) 40 MG tablet TAKE 2 TABLETS BY MOUTH TWO TIMES DAILY (Patient taking differently: Take 80 mg in the mornings and 40 mg in the evenings)  . Glutamine 500 MG CAPS Take by mouth.  02/21/2016 HYDROcodone-acetaminophen (NORCO/VICODIN) 5-325 MG tablet 1-2 tabs po bid prn pain  . Insulin Lispro Prot & Lispro (HUMALOG MIX 75/25 KWIKPEN) (75-25) 100 UNIT/ML Kwikpen INJECT SUBCUTANEOUSLY 25  UNITS EVERY MORNING AND 20  UNITS EVERY EVENING AT  SUPPER  . Insulin Pen Needle (B-D ULTRAFINE III SHORT PEN) 31G X 8 MM MISC USE 1 PENNEEDLE TWICE DAILY  . Insulin Pen Needle (BD ULTRA-FINE PEN NEEDLES) 29G X 12.7MM MISC USE TWICE DAILY  . lisinopril (ZESTRIL) 40 MG tablet Take 1/2 tablet(20mg ) by mouth daily.  . metFORMIN (GLUCOPHAGE) 1000 MG tablet TAKE 1 TABLET BY MOUTH  TWICE DAILY WITH MEALS  . metoprolol tartrate (LOPRESSOR) 50 MG tablet TAKE 1 AND 1/2 TABLETS BY  MOUTH TWICE DAILY  . Multiple Vitamin (MULTIVITAMIN) tablet Take 1 tablet by mouth daily.  Marland Kitchen ULTRA test strip CHECK BLOOD SUGAR TWICE  DAILY  . rivaroxaban (XARELTO) 20 MG TABS  tablet TAKE 1 TABLET BY MOUTH ONCE DAILY WITH SUPPER  . terazosin (HYTRIN) 10 MG capsule TAKE 1 CAPSULE BY MOUTH  ONCE DAILY AT BEDTIME  . vitamin E 400 UNIT capsule Take 400 Units by mouth every morning. Selenium    Facility-Administered Encounter Medications as of 02/07/2021  Medication  . penicillin g benzathine (BICILLIN LA) 1200000 UNIT/2ML injection 600,000  Units  . penicillin g benzathine (BICILLIN LA) 1200000 UNIT/2ML injection 600,000 Units  . penicillin g benzathine (BICILLIN LA) 1200000 UNIT/2ML injection 600,000 Units  . penicillin g benzathine (BICILLIN LA) 1200000 UNIT/2ML injection 600,000 Units    Allergies (verified) Other   History: Past Medical History:  Diagnosis Date  . ALLERGIC RHINITIS 08/11/2006  . ASTHMA 08/11/2006  . Chronic atrial fibrillation (HCC) 08/2014  . Chronic combined systolic and diastolic CHF (congestive heart failure) [facility])    Cardiology f/u 12/2017: pt volume overloaded (R heart dysf suspected), BNP very high, lasix increased.  Repeat echo 12/2017: normal LV EF, mild DD, +RV syst dysfxn, mod pulm HTN, biatrial enlgmt.  . Chronic constipation   . Chronic renal insufficiency, stage 2 (mild)    Borderline stage III (GFR 60s).  . DIABETES MELLITUS, TYPE II 08/11/2006  . DM W/RENAL MANIFESTATIONS, TYPE II 04/20/2007  . HYPERTENSION 08/11/2006  . Impaired mobility and endurance   . MRSA infection 06/2018   LL venous stasis ulcer infected  . Normocytic anemia 2016-2019   03/2018 B12 normal, iron ok (ferritin borderline low).  Plan repeat CBC at f/u summer 2019 and if decreased from baseline will check hemoccults and iron labs again.  . OBESITY, MORBID 12/14/2007   saxenda started 05/2020 by [facility] wt mgmt center  . OSA (obstructive sleep apnea)    to get sleep study with [facility]) as of 12/03/2018 consult.  . 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  [id number] ID started him on amoxil prophylaxis---ineffective.  End 2018/Jan 2019 penicillin G injections prophyl helpful but pt declined to continue this as of 01/2018 ID f/u.  ID talked him into resuming monthly penicillin G as of 02/2018 f/u.    Marland Kitchen Restless leg syndrome    Rx'd clonazepam 09/2017 and pt refused to take it after reading the medication's potential side effects.  . Venous stasis  ulcers of both lower extremities (HCC)    Severe lymphedema.  wound clinic care ongoing as of 01/2018   Past Surgical History:  Procedure Laterality Date  . Carotid dopplers  07/23/2018   Left NORMAL.  Right 1-39% ICA stenosis, with <50% distal CCA stenosis (not hemodynamically significant)  . TRANSTHORACIC ECHOCARDIOGRAM  11/2007; 09/2014; 11/2015;12/2017   LV fxn normal, EF normal, mild dilation of left atrium.  2015 grade II diast dysfxn.  2017 EF 55-60%. 2019: LVEF 60-65%, mild RV syst dysf,biatrial enlgmt, mod pulm htn.  Marland Kitchen URETERAL STENT PLACEMENT    . virtual colonoscopy  01/2011   Normal   Family History  Problem Relation Age of Onset  . Diabetes Mother   . Diabetes Sister   . Hydrocephalus Sister        NPH   Social History   Socioeconomic History  . Marital status: Single    Spouse name: Not on file  . Number of children: Not on file  . Years of education: Not on file  . Highest education level: Not on file  Occupational History  . Occupation: retired  Tobacco Use  . Smoking status: Never  Smoker  . Smokeless tobacco: Never Used  Vaping Use  . Vaping Use: Never used  Substance and Sexual Activity  . Alcohol use: No  . Drug use: No  . Sexual activity: Not on file  Other Topics Concern  . Not on file  Social History Narrative   Single, no children.   [place]orig from [place]   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   No Tobacco, no alc, drugs.   Exercise: no    Diet: no      Social Determinants of Health   Financial Resource Strain: Medium Risk  . Difficulty of Paying Living Expenses: Somewhat hard  Food Insecurity: Food Insecurity Present  . Worried About Programme researcher, broadcasting/film/videounning Out of Food in the Last Year: Sometimes true  . Ran Out of Food in the Last Year: Sometimes true  Transportation Needs: No Transportation Needs  . Lack of Transportation (Medical): No  . Lack of Transportation (Non-Medical): No  Physical Activity: Inactive   . Days of Exercise per Week: 0 days  . Minutes of Exercise per Session: 0 min  Stress: Stress Concern Present  . Feeling of Stress : To some extent  Social Connections: Moderately Isolated  . Frequency of Communication with Friends and Family: More than three times a week  . Frequency of Social Gatherings with Friends and Family: Once a week  . Attends Religious Services: 1 to 4 times per year  . Active Member of Clubs or Organizations: No  . Attends BankerClub or Organization Meetings: Never  . Marital Status: Never married    Tobacco Counseling Counseling given: Not Answered   Clinical Intake:  Pre-visit preparation completed: Yes  Pain : No/denies pain     Nutritional Status: BMI > 30  Obese Nutritional Risks: None Diabetes: Yes CBG done?: No Did pt. bring in CBG monitor from home?: No  How often do you need to have someone help you when you read instructions, pamphlets, or other written materials from your doctor or pharmacy?: 1 - Never  Diabetes:  Is the patient diabetic?  Yes  If diabetic, was a CBG obtained today?  No  Did the patient bring in their glucometer from home?  No phone visit How often do you monitor your CBG's? Twice a day.   Financial Strains and Diabetes Management:  Are you having any financial strains with the device, your supplies or your medication? No .  Does the patient want to be seen by Chronic Care Management for management of their diabetes?  No  Would the patient like to be referred to a Nutritionist or for Diabetic Management?  No   Diabetic Exams:  Diabetic Eye Exam:06/2020. Notes requested  Diabetic Foot Exam: Completed 10/11/2020.    Interpreter Needed?: No  Information entered by :: Thomasenia SalesMartha Phila Shoaf LPN   Activities of Daily Living In your present state of health, do you have any difficulty performing the following activities: 02/07/2021  Hearing? N  Vision? N  Difficulty concentrating or making decisions? N  Walking or  climbing stairs? N  Dressing or bathing? N  Doing errands, shopping? N  Preparing Food and eating ? N  Using the Toilet? N  In the past six months, have you accidently leaked urine? N  Do you have problems with loss of bowel control? N  Managing your Medications? N  Managing your Finances? N  Housekeeping or managing your Housekeeping? N  Some recent data might be hidden    Patient Care Team: McGowen,  Maryjean Morn, MD as PCP - General (Family Medicine) [name] Lisette Abu, MD as PCP - Cardiology (Cardiology) [name] Lisette Abu, MD as Consulting Physician (Cardiology) Maxwell Caul, MD as Consulting Physician (Internal Medicine) [name] Lisette Abu, MD as Consulting Physician (Cardiology) Blanchard Kelch, NP as Nurse Practitioner (Infectious Diseases) Gibson Ramp, MD as Consulting Physician (Ophthalmology) Azalee Course, [place] as Physician Assistant (Cardiology) Mayer Camel, MD as Consulting Physician (General Surgery)  Indicate any recent Medical Services you may have received from other than [facility] providers in the past year (date may be approximate).     Assessment:   This is a routine wellness examination for Henry.  Hearing/Vision screen  Hearing Screening   125Hz  250Hz  500Hz  1000Hz  2000Hz  3000Hz  4000Hz  6000Hz  8000Hz   Right ear:           Left ear:           Comments: No issues   Vision Screening Comments: Wears glasses Last eye exam-06/2020-[facility]  Dietary issues and exercise activities discussed: Current Exercise Habits: The patient does not participate in regular exercise at present, Exercise limited by: Other - see comments (uses a walker)  Goals    . Patient Stated     Eat healthier, increase activity & drink more water      Depression Screen PHQ 2/9 Scores 02/07/2021 01/03/2021 05/01/2020 10/06/2019 10/19/2018 04/16/2018 03/04/2018  PHQ - 2 Score 1 0 2 2 0 0 0  PHQ- 9 Score - - - 5 - - -    Fall Risk Fall Risk  02/07/2021 01/03/2021 05/01/2020 10/06/2019  10/19/2018  Falls in the past year? 0 0 0 0 0  Number falls in past yr: 0 0 - 0 -  Injury with Fall? 0 0 - 0 -  Risk for fall due to : - - No Fall Risks - -  Follow up Falls prevention discussed Falls evaluation completed Falls evaluation completed Falls evaluation completed -    FALL RISK PREVENTION PERTAINING TO THE HOME:  Any stairs in or around the home? No  Home free of loose throw rugs in walkways, pet beds, electrical cords, etc? Yes  Adequate lighting in your home to reduce risk of falls? Yes   ASSISTIVE DEVICES UTILIZED TO PREVENT FALLS:  Life alert? No  Use of a cane, walker or w/c? Yes walker-rollator Grab bars in the bathroom? Yes  Shower chair or bench in shower? No  Elevated toilet seat or a handicapped toilet? No   TIMED UP AND GO:  Was the test performed? No . Phone visit   Cognitive Function:Normal cognitive status assessed by  this Nurse Health Advisor. No abnormalities found.          Immunizations Immunization History  Administered Date(s) Administered  . Fluad Quad(high Dose 65+) 07/06/2019, 07/12/2020  . Influenza Split 08/19/2011, 08/11/2012  . Influenza Whole 08/11/2006, 08/17/2008, 08/08/2009, 09/17/2010  . Influenza, High Dose Seasonal PF 08/05/2016, 09/08/2018  . Influenza,inj,Quad PF,6+ Mos 07/26/2013, 09/11/2015, 09/15/2017  . PFIZER(Purple Top)SARS-COV-2 Vaccination 12/14/2019, 01/10/2020, 09/15/2020  . Pneumococcal Conjugate-13 08/05/2016  . Pneumococcal Polysaccharide-23 04/22/2011, 12/08/2017  . Td 04/10/2009  . Zoster 04/22/2011    TDAP status: Due, Education has been provided regarding the importance of this vaccine. Advised may receive this vaccine at local pharmacy or Health Dept. Aware to provide a copy of the vaccination record if obtained from local pharmacy or Health Dept. Verbalized acceptance and understanding.  Flu Vaccine status: Up to date  Pneumococcal vaccine status: Up  to date  Covid-19 vaccine status: Completed  vaccines  Qualifies for Shingles Vaccine? Yes   Zostavax completed Yes   Shingrix Completed?: No.    Education has been provided regarding the importance of this vaccine. Patient has been advised to call insurance company to determine out of pocket expense if they have not yet received this vaccine. Advised may also receive vaccine at local pharmacy or Health Dept. Verbalized acceptance and understanding.  Screening Tests Health Maintenance  Topic Date Due  . OPHTHALMOLOGY EXAM  01/27/2018  . TETANUS/TDAP  04/11/2019  . COLONOSCOPY (Pts 45-57yrs Insurance coverage will need to be confirmed)  01/02/2021  . Hepatitis C Screening  08/10/2026 (Originally [birth year])  . INFLUENZA VACCINE  06/04/2021  . HEMOGLOBIN A1C  07/06/2021  . FOOT EXAM  10/11/2021  . COVID-19 Vaccine  Completed  . PNA vac Low Risk Adult  Completed  . HPV VACCINES  Aged Out    Health Maintenance  Health Maintenance Due  Topic Date Due  . OPHTHALMOLOGY EXAM  01/27/2018  . TETANUS/TDAP  04/11/2019  . COLONOSCOPY (Pts 45-36yrs Insurance coverage will need to be confirmed)  01/02/2021    Colorectal cancer screening: Cologuard ordered today.  Lung Cancer Screening: (Low Dose CT Chest recommended if Age 5-80 years, 30 pack-year currently smoking OR have quit w/in 15years.) does not qualify.    Additional Screening:  Hepatitis C Screening: does qualify; Declined  Vision Screening: Recommended annual ophthalmology exams for early detection of glaucoma and other disorders of the eye. Is the patient up to date with their annual eye exam?  Yes  Who is the provider or what is the name of the office in which the patient attends annual eye exams? [facility]   Dental Screening: Recommended annual dental exams for proper oral hygiene  Community Resource Referral / Chronic Care Management: CRR required this visit?  Yes For food assistance & exercise information.  CCM required this visit?  No      Plan:     I  have personally reviewed and noted the following in the patient's chart:   . Medical and social history . Use of alcohol, tobacco or illicit drugs  . Current medications and supplements . Functional ability and status . Nutritional status . Physical activity . Advanced directives . List of other physicians . Hospitalizations, surgeries, and ER visits in previous 12 months . Vitals . Screenings to include cognitive, depression, and falls . Referrals and appointments  In addition, I have reviewed and discussed with patient certain preventive protocols, quality metrics, and best practice recommendations. A written personalized care plan for preventive services as well as general preventive health recommendations were provided to patient.   Due to this being a telephonic visit, the after visit summary with patients personalized plan was offered to patient via mail or my-chart. Patient would like to access on my-chart.  Roanna Raider, LPN   [id number]  Nurse Health Advisor  Nurse Notes: None

## 2021-02-07 NOTE — Patient Instructions (Signed)
[name] , Thank you for taking time to come for your Medicare Wellness Visit. I appreciate your ongoing commitment to your health goals. Please review the following plan we discussed and let me know if I can assist you in the future.   Screening recommendations/referrals: Colonoscopy: Cologuard ordered today. You will receive a kit with instructions in the mail. Recommended yearly ophthalmology/optometry visit for glaucoma screening and checkup Recommended yearly dental visit for hygiene and checkup  Vaccinations: Influenza vaccine: Up to date Pneumococcal vaccine: Completed vaccines Tdap vaccine: Discuss with pharmacy Shingles vaccine: Discuss with pharmacy  Covid-19: Completed vaccines  Advanced directives: Information mailed today.  Conditions/risks identified: See problem list  Next appointment: Follow up in one year for your annual wellness visit. 02/13/2021 @ 9:00  Preventive Care 65 Years and Older, Male Preventive care refers to lifestyle choices and visits with your health care provider that can promote health and wellness. What does preventive care include?  A yearly physical exam. This is also called an annual well check.  Dental exams once or twice a year.  Routine eye exams. Ask your health care provider how often you should have your eyes checked.  Personal lifestyle choices, including:  Daily care of your teeth and gums.  Regular physical activity.  Eating a healthy diet.  Avoiding tobacco and drug use.  Limiting alcohol use.  Practicing safe sex.  Taking low doses of aspirin every day.  Taking vitamin and mineral supplements as recommended by your health care provider. What happens during an annual well check? The services and screenings done by your health care provider during your annual well check will depend on your age, overall health, lifestyle risk factors, and family history of disease. Counseling  Your health care provider may ask you  questions about your:  Alcohol use.  Tobacco use.  Drug use.  Emotional well-being.  Home and relationship well-being.  Sexual activity.  Eating habits.  History of falls.  Memory and ability to understand (cognition).  Work and work Statistician. Screening  You may have the following tests or measurements:  Height, weight, and BMI.  Blood pressure.  Lipid and cholesterol levels. These may be checked every 5 years, or more frequently if you are over 57 years old.  Skin check.  Lung cancer screening. You may have this screening every year starting at age 10 if you have a 30-pack-year history of smoking and currently smoke or have quit within the past 15 years.  Fecal occult blood test (FOBT) of the stool. You may have this test every year starting at age 52.  Flexible sigmoidoscopy or colonoscopy. You may have a sigmoidoscopy every 5 years or a colonoscopy every 10 years starting at age 64.  Prostate cancer screening. Recommendations will vary depending on your family history and other risks.  Hepatitis C blood test.  Hepatitis B blood test.  Sexually transmitted disease (STD) testing.  Diabetes screening. This is done by checking your blood sugar (glucose) after you have not eaten for a while (fasting). You may have this done every 1-3 years.  Abdominal aortic aneurysm (AAA) screening. You may need this if you are a current or former smoker.  Osteoporosis. You may be screened starting at age 46 if you are at high risk. Talk with your health care provider about your test results, treatment options, and if necessary, the need for more tests. Vaccines  Your health care provider may recommend certain vaccines, such as:  Influenza vaccine. This is recommended every  year.  Tetanus, diphtheria, and acellular pertussis (Tdap, Td) vaccine. You may need a Td booster every 10 years.  Zoster vaccine. You may need this after age 71.  Pneumococcal 13-valent conjugate  (PCV13) vaccine. One dose is recommended after age 86.  Pneumococcal polysaccharide (PPSV23) vaccine. One dose is recommended after age 40. Talk to your health care provider about which screenings and vaccines you need and how often you need them. This information is not intended to replace advice given to you by your health care provider. Make sure you discuss any questions you have with your health care provider. Document Released: 11/17/2015 Document Revised: 07/10/2016 Document Reviewed: 08/22/2015 Elsevier Interactive Patient Education  2017 [place] Prevention in the Home Falls can cause injuries. They can happen to people of all ages. There are many things you can do to make your home safe and to help prevent falls. What can I do on the outside of my home?  Regularly fix the edges of walkways and driveways and fix any cracks.  Remove anything that might make you trip as you walk through a door, such as a raised step or threshold.  Trim any bushes or trees on the path to your home.  Use bright outdoor lighting.  Clear any walking paths of anything that might make someone trip, such as rocks or tools.  Regularly check to see if handrails are loose or broken. Make sure that both sides of any steps have handrails.  Any raised decks and porches should have guardrails on the edges.  Have any leaves, snow, or ice cleared regularly.  Use sand or salt on walking paths during winter.  Clean up any spills in your garage right away. This includes oil or grease spills. What can I do in the bathroom?  Use night lights.  Install grab bars by the toilet and in the tub and shower. Do not use towel bars as grab bars.  Use non-skid mats or decals in the tub or shower.  If you need to sit down in the shower, use a plastic, non-slip stool.  Keep the floor dry. Clean up any water that spills on the floor as soon as it happens.  Remove soap buildup in the tub or shower  regularly.  Attach bath mats securely with double-sided non-slip rug tape.  Do not have throw rugs and other things on the floor that can make you trip. What can I do in the bedroom?  Use night lights.  Make sure that you have a light by your bed that is easy to reach.  Do not use any sheets or blankets that are too big for your bed. They should not hang down onto the floor.  Have a firm chair that has side arms. You can use this for support while you get dressed.  Do not have throw rugs and other things on the floor that can make you trip. What can I do in the kitchen?  Clean up any spills right away.  Avoid walking on wet floors.  Keep items that you use a lot in easy-to-reach places.  If you need to reach something above you, use a strong step stool that has a grab bar.  Keep electrical cords out of the way.  Do not use floor polish or wax that makes floors slippery. If you must use wax, use non-skid floor wax.  Do not have throw rugs and other things on the floor that can make you trip. What can  I do with my stairs?  Do not leave any items on the stairs.  Make sure that there are handrails on both sides of the stairs and use them. Fix handrails that are broken or loose. Make sure that handrails are as long as the stairways.  Check any carpeting to make sure that it is firmly attached to the stairs. Fix any carpet that is loose or worn.  Avoid having throw rugs at the top or bottom of the stairs. If you do have throw rugs, attach them to the floor with carpet tape.  Make sure that you have a light switch at the top of the stairs and the bottom of the stairs. If you do not have them, ask someone to add them for you. What else can I do to help prevent falls?  Wear shoes that:  Do not have high heels.  Have rubber bottoms.  Are comfortable and fit you well.  Are closed at the toe. Do not wear sandals.  If you use a stepladder:  Make sure that it is fully  opened. Do not climb a closed stepladder.  Make sure that both sides of the stepladder are locked into place.  Ask someone to hold it for you, if possible.  Clearly mark and make sure that you can see:  Any grab bars or handrails.  First and last steps.  Where the edge of each step is.  Use tools that help you move around (mobility aids) if they are needed. These include:  Canes.  Walkers.  Scooters.  Crutches.  Turn on the lights when you go into a dark area. Replace any light bulbs as soon as they burn out.  Set up your furniture so you have a clear path. Avoid moving your furniture around.  If any of your floors are uneven, fix them.  If there are any pets around you, be aware of where they are.  Review your medicines with your doctor. Some medicines can make you feel dizzy. This can increase your chance of falling. Ask your doctor what other things that you can do to help prevent falls. This information is not intended to replace advice given to you by your health care provider. Make sure you discuss any questions you have with your health care provider. Document Released: 08/17/2009 Document Revised: 03/28/2016 Document Reviewed: 11/25/2014 Elsevier Interactive Patient Education  2017 [employer].

## 2021-02-08 NOTE — Progress Notes (Signed)
VERTIS, SCHEIB ([id number]) Visit Report for 02/07/2021 Arrival Information Details Patient Name: Date of Service: [name], [name] 02/07/2021 10:30 A M Medical Record Number: [id number] Patient Account Number: [id number] Date of Birth/Sex: Treating RN: 11-15-1950 (70 y.o. Bayard Hugger, Bonita Quin Primary Care Francesca Strome: Nicoletta Ba Other Clinician: Referring Brentt Fread: Treating Veanna Dower/Extender: Adele Dan in Treatment: 263 Visit Information History Since Last Visit Added or deleted any medications: No Patient Arrived: [name] Any new allergies or adverse reactions: No Arrival Time: 11:12 Had a fall or experienced change in No Accompanied By: self activities of daily living that may affect Transfer Assistance: None risk of falls: Patient Identification Verified: Yes Signs or symptoms of abuse/neglect since last visito No Secondary Verification Process Completed: Yes Hospitalized since last visit: No Patient Requires Transmission-Based Precautions: No Implantable device outside of the clinic excluding No Patient Has Alerts: Yes cellular tissue based products placed in the center since last visit: Has Dressing in Place as Prescribed: Yes Pain Present Now: No Electronic Signature(s) Signed: 02/08/2021 4:58:23 PM By: Karl Ito Entered By: Karl Ito on 02/07/2021 11:12:55 -------------------------------------------------------------------------------- Compression Therapy Details Patient Name: Date of Service: [name], [name] 02/07/2021 10:30 A M Medical Record Number: [id number] Patient Account Number: [id number] Date of Birth/Sex: Treating RN: [birth year] (70 y.o. [name] Primary Care Orlin Kann: Nicoletta Ba Other Clinician: Referring Berkley Cronkright: Treating Daneen Volcy/Extender: Adele Dan in Treatment: 263 Compression Therapy Performed for Wound Assessment: NonWound Condition Lymphedema - Right Leg Performed By:  Clinician Shawn Stall, RN Compression Type: Four Layer Electronic Signature(s) Signed: 02/07/2021 5:48:55 PM By: Shawn Stall Entered By: Shawn Stall on 02/07/2021 15:25:57 -------------------------------------------------------------------------------- Compression Therapy Details Patient Name: Date of Service: [name], [name] 02/07/2021 10:30 A M Medical Record Number: [id number] Patient Account Number: [id number] Date of Birth/Sex: Treating RN: January 06, 1951 (70 y.o. [name] Primary Care Lollie Gunner: Other Clinician: Nicoletta Ba Referring Zayd Bonet: Treating Terrilee Dudzik/Extender: Adele Dan in Treatment: 263 Compression Therapy Performed for Wound Assessment: NonWound Condition Lymphedema - Left Leg Performed By: Clinician Shawn Stall, RN Compression Type: Four Layer Electronic Signature(s) Signed: 02/07/2021 5:48:55 PM By: Shawn Stall Entered By: Shawn Stall on 02/07/2021 15:27:32 -------------------------------------------------------------------------------- Encounter Discharge Information Details Patient Name: Date of Service: [facility], Colt J. 02/07/2021 10:30 A M Medical Record Number: [id number] Patient Account Number: [id number] Date of Birth/Sex: Treating RN: Dec 07, 1950 (70 y.o. [name] Primary Care Natahsa Marian: Nicoletta Ba Other Clinician: Referring Gearlene Godsil: Treating Twanda Stakes/Extender: Adele Dan in Treatment: 263 Encounter Discharge Information Items Discharge Condition: Stable Ambulatory Status: Walker Discharge Destination: Home Transportation: Private Auto Accompanied By: self Schedule Follow-up Appointment: Yes Clinical Summary of Care: Electronic Signature(s) Signed: 02/07/2021 5:48:55 PM By: Shawn Stall Entered By: Shawn Stall on 02/07/2021 15:48:51 -------------------------------------------------------------------------------- Patient/Caregiver Education Details Patient  Name: Date of Service: Kevin [name], [name] 4/6/2022andnbsp10:30 A M Medical Record Number: [id number] Patient Account Number: [id number] Date of Birth/Gender: Treating RN: 1951-01-18 (70 y.o. [name] Primary Care Physician: Nicoletta Ba Other Clinician: Referring Physician: Treating Physician/Extender: Adele Dan in Treatment: [phone] Education Assessment Education Provided To: Patient Education Topics Provided Venous: Handouts: Managing Venous Disease and Related Ulcers Methods: Explain/Verbal Responses: Reinforcements needed Electronic Signature(s) Signed: 02/07/2021 5:48:55 PM By: Shawn Stall Entered By: Shawn Stall on 02/07/2021 15:46:36 -------------------------------------------------------------------------------- Wound Assessment Details Patient Name: Date of Service: Kevin [name], [name] 02/07/2021 10:30 A M Medical Record Number: [id number] Patient Account Number: [id number] Date of  Birth/Sex: Treating RN: 1951/04/14 (70 y.o. [name] Primary Care Carmel Garfield: Nicoletta Ba Other Clinician: Referring Katheryne Gorr: Treating Willard Farquharson/Extender: Adele Dan in Treatment: 263 Wound Status Wound Number: [id number] Primary Etiology: Diabetic Wound/Ulcer of the Lower Extremity Wound Location: Left T Second oe Wound Status: Open Wounding Event: Gradually Appeared Date Acquired: 01/31/2021 Weeks Of Treatment: 1 Clustered Wound: No Wound Measurements Length: (cm) 2.6 Width: (cm) 1.3 Depth: (cm) 0.1 Area: (cm) 2.655 Volume: (cm) 0.265 % Reduction in Area: 0% % Reduction in Volume: 0% Wound Description Classification: Grade 1 Treatment Notes Wound #193 (Toe Second) Wound Laterality: Left Cleanser Peri-Wound Care Topical Primary Dressing KerraCel Ag Gelling Fiber Dressing, 2x2 in (silver alginate) Discharge Instruction: Apply silver alginate to wound bed as instructed Secondary Dressing Woven Gauze Sponges  2x2 in Discharge Instruction: Apply over primary dressing as directed. Secured With Conforming Stretch Gauze Bandage, Sterile 2x75 (in/in) Discharge Instruction: Secure with stretch gauze as directed. Compression Wrap Compression Stockings Add-Ons Notes BLE- 4 compression layer, lotion, with first layer of unna boot applied to upper portion of lower leg. stocknette. Electronic Signature(s) Signed: 02/07/2021 5:59:57 PM By: Zenaida Deed RN, BSN Signed: 02/08/2021 4:58:23 PM By: Karl Ito Entered By: Karl Ito on 02/07/2021 11:13:39 -------------------------------------------------------------------------------- Vitals Details Patient Name: Date of Service: Kevin [name], [name] 02/07/2021 10:30 A M Medical Record Number: [id number] Patient Account Number: [id number] Date of Birth/Sex: Treating RN: 1951-05-11 (70 y.o. [name] Primary Care Shakesha Soltau: Nicoletta Ba Other Clinician: Referring Taygen Newsome: Treating Lanson Randle/Extender: Adele Dan in Treatment: 263 Vital Signs Time Taken: 11:12 Temperature (F): 98.4 Height (in): 70 Pulse (bpm): 73 Weight (lbs): 380.2 Respiratory Rate (breaths/min): 22 Body Mass Index (BMI): 54.5 Blood Pressure (mmHg): 169/76 Capillary Blood Glucose (mg/dl): 790 Reference Range: 80 - 120 mg / dl Electronic Signature(s) Signed: 02/08/2021 4:58:23 PM By: Karl Ito Entered By: Karl Ito on 02/07/2021 11:13:26

## 2021-02-09 ENCOUNTER — Other Ambulatory Visit: Payer: Self-pay | Admitting: *Deleted

## 2021-02-09 ENCOUNTER — Telehealth: Payer: Self-pay | Admitting: *Deleted

## 2021-02-09 ENCOUNTER — Encounter: Payer: Self-pay | Admitting: *Deleted

## 2021-02-09 NOTE — Patient Outreach (Signed)
[facility]) Care Management  02/09/2021  Yasin Ducat Garate 14-Nov-1950 [id number]   Telephone Assessment-Unsuccessful  Referral received 4/8 Initial Outreach 4/8  RN attempted outreach call today however unsuccessful. RN left a HIPAA approved voice message requesting a call back.  Will reschedule another outreach call over the next week and send outreach letter.  Elliot Cousin, RN Care Management Coordinator [facility] Main Office [phone]

## 2021-02-09 NOTE — Telephone Encounter (Signed)
   Telephone encounter was:  Successful.  02/09/2021 Name: Hani Campusano Lackey MRN: [id number] DOB: 1951/08/19  Billy Turvey Martinezgarcia is a 70 y.o. year old male who is a primary care patient of McGowen, Maryjean Morn, MD . The community resource team was consulted for assistance with Food Insecurity, Financial Difficulties related to Food  and in home care   Care guide performed the following interventions: Patient provided with information about care guide support team and interviewed to confirm resource needs Discussed resources to assist with pcs ,food, exercise  Follow up call placed to community resources to determine status of patients referral.  Follow Up Plan:  Care guide will follow up with patient by phone over the next week Alois Cliche -[facility] Guide , Embedded Care Coordination [facility], Care Management  [phone] [address] [place] , [place] [address] Email : [email or web address]

## 2021-02-09 NOTE — Telephone Encounter (Signed)
   Telephone encounter was:  Unsuccessful.  02/09/2021 Name: Lash Matulich [place] MRN: [id number] DOB: 09/06/1951  Unsuccessful outbound call made today to assist with:  Food Insecurity and exercise  Outreach Attempt:  1st Attempt  A HIPAA compliant voice message was left requesting a return call.  Instructed patient to call back at [phone] [facility] Referral Received

## 2021-02-14 ENCOUNTER — Other Ambulatory Visit: Payer: Self-pay

## 2021-02-14 ENCOUNTER — Other Ambulatory Visit: Payer: Self-pay | Admitting: *Deleted

## 2021-02-14 ENCOUNTER — Encounter (HOSPITAL_BASED_OUTPATIENT_CLINIC_OR_DEPARTMENT_OTHER): Payer: Medicare Other | Admitting: Internal Medicine

## 2021-02-14 DIAGNOSIS — I87333 Chronic venous hypertension (idiopathic) with ulcer and inflammation of bilateral lower extremity: Secondary | ICD-10-CM | POA: Diagnosis not present

## 2021-02-14 DIAGNOSIS — I10 Essential (primary) hypertension: Secondary | ICD-10-CM

## 2021-02-14 DIAGNOSIS — E1140 Type 2 diabetes mellitus with diabetic neuropathy, unspecified: Secondary | ICD-10-CM | POA: Diagnosis not present

## 2021-02-14 DIAGNOSIS — I890 Lymphedema, not elsewhere classified: Secondary | ICD-10-CM | POA: Diagnosis not present

## 2021-02-14 DIAGNOSIS — E11621 Type 2 diabetes mellitus with foot ulcer: Secondary | ICD-10-CM | POA: Diagnosis not present

## 2021-02-14 DIAGNOSIS — L97521 Non-pressure chronic ulcer of other 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 of skin: Secondary | ICD-10-CM

## 2021-02-14 NOTE — Patient Outreach (Signed)
[facility]) Care Management  02/14/2021  [name] 18-Oct-1951 [id number]   Care Coordination-Successful-Declined [facility] case management services. Incoming Call  RN received an incoming call from pt today concerning the referral for food securities and community resources on [employer]. Pt receptive to discussing [facility] services as RN inquires on any other needs based upon pt's medical issues. Pt reports ongoing wound care clinic visits weekly along with the following management of care.  HTN- Pt states this condition is under control with his ongoing medications. No needs. Atrial Fibrillation-Pt also reports this condition is controlled with no incidents or issues. HF-Pt currently wears wrappings to his legs do he never has issues with swelling and takes his medication as recommended. No issues or related needs at this time. DM- Pt reports morning read at 187 and last A1C 7.1. Pt states he knows it's related to the ongoing wound care treatments and his endocrinologist has recently intervene with starting him on Ozempic week week to assist with improving his numbers. Pt follows a high protein and low carbohydrate diet.  RN has offered [facility] services and discussed all levels of disciplines however pt opt to declined but again receptive to the requested food resources and personal care services agencies. Also offered possible health coach for quarterly disease specific management of care however pt also declined. Offered to send [facility] information packet if needed in the future (receptive).   Will notify provider, make a referral to the Care-guides for the requested resources, send [facility] packet and close this case at this time.  Elliot Cousin, RN Care Management Coordinator [facility] Main Office [phone]

## 2021-02-14 NOTE — Progress Notes (Signed)
[name], [name] ([id number]) Visit Report for 02/14/2021 Arrival Information Details Patient Name: Date of Service: [name], [name] 02/14/2021 10:30 A M Medical Record Number: [id number] Patient Account Number: [id number] Date of Birth/Sex: Treating RN: 09-May-1951 (70 y.o. [name] Primary Care [name]: [name] [name]: Treating [name] in Treatment: 51 Visit Information History Since Last Visit Added or deleted any medications: No Patient Arrived: Walker Any new allergies or adverse reactions: No Arrival Time: 10:48 Had a fall or experienced change in No Accompanied By: self activities of daily living that may affect Transfer Assistance: None risk of falls: Patient Identification Verified: Yes Signs or symptoms of abuse/neglect since last visito No Secondary Verification Process Completed: Yes Hospitalized since last visit: No Patient Requires Transmission-Based Precautions: No Implantable device outside of the clinic excluding No Patient Has Alerts: Yes cellular tissue based products placed in the [name] since last visit: Has Dressing in Place as Prescribed: Yes Has Compression in Place as Prescribed: Yes Pain Present Now: No Electronic Signature(s) Signed: 02/14/2021 12:26:47 PM By: [name] Entered By: [name] on 02/14/2021 10:48:39 -------------------------------------------------------------------------------- Compression Therapy Details Patient Name: Date of Service: [name], [name] 02/14/2021 10:30 A M Medical Record Number: [id number] Patient Account Number: [id number] Date of Birth/Sex: Treating RN: 03-14-1951 (70 y.o. [name] Primary Care Fianna Snowball: [name] Kevin Clinician: Referring [name]: Treating [name]/Extender: [name] in Treatment: 264 Compression Therapy Performed for Wound Assessment: Wound #194  Left,Lateral Lower Leg Performed By: Clinician Kevin Pilling, RN Compression Type: Four Layer Post Procedure Diagnosis Same as Pre-procedure Electronic Signature(s) Signed: 02/14/2021 12:26:47 PM By: [name] Entered By: [name] on 02/14/2021 11:35:01 -------------------------------------------------------------------------------- Compression Therapy Details Patient Name: Date of Service: [name], [name] 02/14/2021 10:30 A M Medical Record Number: [id number] Patient Account Number: [id number] Date of Birth/Sex: Treating RN: [birth year] (70 y.o. [name] Primary Care Rayshawn Maney: [name] Kevin Clinician: Referring [name]: Treating [name]/Extender: [name] in Treatment: 264 Compression Therapy Performed for Wound Assessment: Wound #195 Left,Anterior Lower Leg Performed By: Clinician Kevin Pilling, RN Compression Type: Four Layer Post Procedure Diagnosis Same as Pre-procedure Electronic Signature(s) Signed: 02/14/2021 12:26:47 PM By: [name] Entered By: [name] on 02/14/2021 11:35:01 -------------------------------------------------------------------------------- Compression Therapy Details Patient Name: Date of Service: [name], [name] 02/14/2021 10:30 A M Medical Record Number: [id number] Patient Account Number: [id number] Date of Birth/Sex: Treating RN: [birth year] (70 y.o. [name] Primary Care Lilygrace Rodick: [name] Kevin Clinician: Referring [name] in Treatment: 264 Compression Therapy Performed for Wound Assessment: NonWound Condition Lymphedema - Right Leg Performed By: Clinician Kevin Pilling, RN Compression Type: Four Layer Post Procedure Diagnosis Same as Pre-procedure Electronic Signature(s) Signed: 02/14/2021 12:26:47 PM By: [name] Entered By: [name] on 02/14/2021  11:35:21 -------------------------------------------------------------------------------- Encounter Discharge Information Details Patient Name: Date of Service: [name], [name] 02/14/2021 10:30 A M Medical Record Number: [id number] Patient Account Number: [id number] Date of Birth/Sex: Treating RN: [birth year] (70 y.o. [name] Primary Care Zadkiel Dragan: [name] Kevin Clinician: Referring [name]: Treating [name] in Treatment: [phone] Encounter Discharge Information Items Discharge Condition: Stable Ambulatory Status: Walker Discharge Destination: Home Transportation: Private Auto Accompanied By: self Schedule Follow-up Appointment: Yes Clinical Summary of Care: Electronic Signature(s) Signed: 02/14/2021 12:26:47 PM By: [name] Entered By: [name] on 02/14/2021 12:05:03 -------------------------------------------------------------------------------- Lower Extremity Assessment Details Patient Name: Date  of Service: [name], [name] 02/14/2021 10:30 A M Medical Record Number: [id number] Patient Account Number: [id number] Date of Birth/Sex: Treating RN: Apr 01, 1951 (70 y.o. [name] Primary Care [name]: [name] Kevin Clinician: Referring [name]: Treating [name] in Treatment: 264 [name]: Yes] [name]: Yes] Kevin: [Left: Yes] [Right: Yes] Calf Left: Right: Point of Measurement: 25 cm From Medial Instep 45.5 cm 40.5 cm Ankle Left: Right: Point of Measurement: 9 cm From Medial Instep 28 cm 26 cm Electronic Signature(s) Signed: 02/14/2021 12:26:47 PM By: [name] Entered By: [name] on 02/14/2021 10:59:01 -------------------------------------------------------------------------------- Multi Wound Chart Details Patient Name: Date of Service: [name], [name] 02/14/2021 10:30 A M Medical Record Number:  [id number] Patient Account Number: [id number] Date of Birth/Sex: Treating RN: 1951/09/16 (70 y.o. [name] Primary Care [name]: [name] Kevin Clinician: Referring [name]: Treating [name] in Treatment: 264 Vital Signs Height(in): 70 Capillary Blood Glucose(mg/dl): 187 Weight(lbs): 380.2 Pulse(bpm): 25 Body Mass Index(BMI): 63 Blood Pressure(mmHg): 167/69 Temperature(F): 98.3 Respiratory Rate(breaths/min): 20 Photos: [193:No Photos Left T Second oe] [194:No Photos Left, Lateral Lower Leg] [195:No Photos Left, Anterior Lower Leg] Wound Location: [193:Gradually Appeared] [194:Gradually Appeared] [195:Skin Tear/Laceration] Wounding Event: [193:Diabetic Wound/Ulcer of the Lower] [194:Lymphedema] [195:Skin Tear] Primary Etiology: [193:Extremity Chronic sinus problems/congestion,] [194:Chronic sinus problems/congestion, Chronic sinus problems/congestion,] Comorbid History: [193:Arrhythmia, Hypertension, Peripheral Arrhythmia, Hypertension, Peripheral Arrhythmia, Hypertension, Peripheral Arterial Disease, Type II Diabetes, Arterial Disease, Type II Diabetes, Arterial Disease, Type II Diabetes, History of  Burn, Gout, Confinement Anxiety 01/31/2021] [194:History of Burn, Gout, Confinement History of Burn, Gout, Confinement Anxiety 02/14/2021] [419:Anxiety 02/14/2021] Date Acquired: [193:2] [194:0] [195:0] Weeks of Treatment: [193:Open] [194:Open] [195:Open] Wound Status: [193:No] [194:Yes] [195:No] Clustered Wound: [193:N/A] [194:2] [195:N/A] Clustered Quantity: [193:3x2x0.1] [194:3x2x0.1] [195:1.5x0.5x0.1] Measurements L x W x D (cm) [622:2.979] [194:4.712] [195:0.589] A (cm) : rea [193:0.471] [194:0.471] [195:0.059] Volume (cm) : [193:-77.50%] [194:N/A] [195:N/A] % Reduction in Area: [193:-77.70%] [194:N/A] [195:N/A] % Reduction in Volume: [193:Grade 1] [194:Full Thickness Without Exposed] [195:Full Thickness Without  Exposed] Classification: [193:Medium] [194:Support Structures Medium] [195:Support Structures Medium] Exudate Amount: [193:Serosanguineous] [194:Serosanguineous] [195:Serosanguineous] Exudate Type: [193:red, brown] [194:red, brown] [195:red, brown] Exudate Color: [193:Distinct, outline attached] [194:Distinct, outline attached] [195:Distinct, outline attached] Wound Margin: [193:Large (67-100%)] [194:Large (67-100%)] [195:Large (67-100%)] Granulation Amount: [193:Red] [194:Red] [195:Red] Granulation Quality: [193:None Present (0%)] [194:None Present (0%)] [195:None Present (0%)] Necrotic Amount: [193:Fat Layer (Subcutaneous Tissue): Yes Fat Layer (Subcutaneous Tissue): Yes Fat Layer (Subcutaneous Tissue): Yes] Exposed Structures: [193:Fascia: No Tendon: No Muscle: No Joint: No Bone: No None] [194:Fascia: No Tendon: No Muscle: No Joint: No Bone: No Small (1-33%)] [195:Fascia: No Tendon: No Muscle: No Joint: No Bone: No None] Epithelialization: [193:macerated periwound.] [194:N/A] [195:N/A] Assessment Notes: [193:N/A] [194:Compression Therapy] [195:Compression Therapy] Treatment Notes Wound #193 (Toe Second) Wound Laterality: Left Cleanser Peri-Wound Care Topical Primary Dressing PolyMem Silver Non-Adhesive Dressing, 4.25x4.25 in Discharge Instruction: Apply to wound bed as instructed Secondary Dressing Woven Gauze Sponges 2x2 in Discharge Instruction: Apply over primary dressing as directed. Secured With Conforming Stretch Gauze Bandage, Sterile 2x75 (in/in) Discharge Instruction: Secure with stretch gauze as directed. Compression Wrap Compression Stockings Add-Ons Wound #194 (Lower Leg) Wound Laterality: Left, Lateral Cleanser Soap and Water Discharge Instruction: May shower and wash wound with dial antibacterial soap and water prior to dressing change. Peri-Wound Care Zinc Oxide Ointment 30g tube Discharge Instruction: Apply Zinc Oxide to periwound with each dressing  change Sween Lotion (Moisturizing lotion) Discharge Instruction: Apply moisturizing lotion as  directed Topical Primary Dressing KerraCel Ag Gelling Fiber Dressing, 2x2 in (silver alginate) Discharge Instruction: Apply silver alginate to wound bed as instructed Secondary Dressing ABD Pad, 8x10 Discharge Instruction: Apply over primary dressing as directed. Secured With Compression Wrap FourPress (4 layer compression wrap) Discharge Instruction: ***Unna boot first layer to upper portion of lower leg.***Apply four layer compression as directed. Compression Stockings Add-Ons Wound #195 (Lower Leg) Wound Laterality: Left, Anterior Cleanser Soap and Water Discharge Instruction: May shower and wash wound with dial antibacterial soap and water prior to dressing change. Peri-Wound Care Zinc Oxide Ointment 30g tube Discharge Instruction: Apply Zinc Oxide to periwound with each dressing change Sween Lotion (Moisturizing lotion) Discharge Instruction: Apply moisturizing lotion as directed Topical Primary Dressing KerraCel Ag Gelling Fiber Dressing, 2x2 in (silver alginate) Discharge Instruction: Apply silver alginate to wound bed as instructed Secondary Dressing ABD Pad, 8x10 Discharge Instruction: Apply over primary dressing as directed. Secured With Compression Wrap FourPress (4 layer compression wrap) Discharge Instruction: ***Unna boot first layer to upper portion of lower leg.***Apply four layer compression as directed. Compression Stockings Add-Ons Electronic Signature(s) Signed: 02/14/2021 12:50:34 PM By: Kalman Shan DO Signed: 02/14/2021 5:38:32 PM By: Baruch Gouty RN, BSN Entered By: Kalman Shan on 02/14/2021 12:32:39 -------------------------------------------------------------------------------- Multi-Disciplinary Care Plan Details Patient Name: Date of Service: [name], [name] 02/14/2021 10:30 A M Medical Record Number: [id number] Patient Account Number:  [id number] Date of Birth/Sex: Treating RN: [birth year] (70 y.o. [name] Primary Care Welborn Keena: [name] Kevin Clinician: Referring Pravin Perezperez: Treating Tura Roller/Extender: [name] in Treatment: [place] reviewed with physician Active Inactive Venous Leg Ulcer Nursing Diagnoses: Actual venous Insuffiency (use after diagnosis is confirmed) Goals: Patient will maintain optimal Kevin control Date Initiated: 09/10/2016 Target Resolution Date: 03/02/2021 Goal Status: Active Verify adequate tissue perfusion prior to therapeutic compression application Date Initiated: 09/10/2016 Date Inactivated: 11/28/2016 Goal Status: Met Interventions: Assess peripheral Kevin status every visit. Compression as ordered Provide education on venous insufficiency Notes: Electronic Signature(s) Signed: 02/14/2021 12:26:47 PM By: [name] Entered By: [name] on 02/14/2021 11:39:35 -------------------------------------------------------------------------------- Pain Assessment Details Patient Name: Date of Service: [name], YONKE. 02/14/2021 10:30 A M Medical Record Number: [id number] Patient Account Number: [id number] Date of Birth/Sex: Treating RN: [birth year] (70 y.o. [name] Primary Care Yadiel Aubry: [name] Kevin Clinician: Referring Coleston Dirosa: Treating Danae Oland/Extender: [name] in Treatment: 264 Active Problems Location of Pain Severity and Description of Pain Patient Has Paino No Site Locations Rate the pain. Current Pain Level: 0 Pain Management and Medication Current Pain Management: Medication: No Cold Application: No Rest: No Massage: No Activity: No T.E.N.S.: No Heat Application: No Leg drop or elevation: No Is the Current Pain Management Adequate: Adequate How does your wound impact your activities of daily livingo Sleep: No Bathing: No Appetite:  No Relationship With Others: No Bladder Continence: No Emotions: No Bowel Continence: No Work: No Toileting: No Drive: No Dressing: No Hobbies: No Electronic Signature(s) Signed: 02/14/2021 12:26:47 PM By: [name] Entered By: [name] on 02/14/2021 10:54:03 -------------------------------------------------------------------------------- Patient/Caregiver Education Details Patient Name: Date of Service: [name], [name] 4/13/2022andnbsp10:30 [place] Record Number: [id number] Patient Account Number: [id number] Date of Birth/Gender: Treating RN: Nov 15, 1950 (70 y.o. [name] Primary Care Physician: [name] Kevin Clinician: Referring Physician: Treating Physician/Extender: [name] in Treatment: [phone] Education Assessment Education Provided To: Patient Education Topics Provided Venous: Handouts: Managing Venous Disease and Related Ulcers Methods: Explain/Verbal Responses:  Reinforcements needed Electronic Signature(s) Signed: 02/14/2021 12:26:47 PM By: [name] Entered By: [name] on 02/14/2021 11:40:11 -------------------------------------------------------------------------------- Wound Assessment Details Patient Name: Date of Service: [name], [name] 02/14/2021 10:30 A M Medical Record Number: [id number] Patient Account Number: [id number] Date of Birth/Sex: Treating RN: [birth year] (70 y.o. [name] Primary Care Jaquese Irving: [name] Kevin Clinician: Referring Jezreel Justiniano: Treating Alaiyah Bollman/Extender: [name] in Treatment: 264 Wound Status Wound Number: [id number] Primary Diabetic Wound/Ulcer of the Lower Extremity Etiology: Wound Location: Left T Second oe Wound Open Wounding Event: Gradually Appeared Status: Date Acquired: 01/31/2021 Comorbid Chronic sinus problems/congestion, Arrhythmia, Hypertension, Weeks Of Treatment: 2 History: Peripheral Arterial Disease,  Type II Diabetes, History of Burn, Gout, Clustered Wound: No Confinement Anxiety Photos Wound Measurements Length: (cm) 3 Width: (cm) 2 Depth: (cm) 0.1 Area: (cm) 4.712 Volume: (cm) 0.471 % Reduction in Area: -77.5% % Reduction in Volume: -77.7% Epithelialization: None Tunneling: No Undermining: No Wound Description Classification: Grade 1 Wound Margin: Distinct, outline attached Exudate Amount: Medium Exudate Type: Serosanguineous Exudate Color: red, brown Foul Odor After Cleansing: No Slough/Fibrino No Wound Bed Granulation Amount: Large (67-100%) Exposed Structure Granulation Quality: Red Fascia Exposed: No Necrotic Amount: None Present (0%) Fat Layer (Subcutaneous Tissue) Exposed: Yes Tendon Exposed: No Muscle Exposed: No Joint Exposed: No Bone Exposed: No Assessment Notes macerated periwound. Treatment Notes Wound #193 (Toe Second) Wound Laterality: Left Cleanser Peri-Wound Care Topical Primary Dressing PolyMem Silver Non-Adhesive Dressing, 4.25x4.25 in Discharge Instruction: Apply to wound bed as instructed Secondary Dressing Woven Gauze Sponges 2x2 in Discharge Instruction: Apply over primary dressing as directed. Secured With Conforming Stretch Gauze Bandage, Sterile 2x75 (in/in) Discharge Instruction: Secure with stretch gauze as directed. Compression Wrap Compression Stockings Add-Ons Electronic Signature(s) Signed: 02/14/2021 4:36:07 PM By: Sandre Kitty Signed: 02/14/2021 5:55:32 PM By: [name] Previous Signature: 02/14/2021 12:26:47 PM Version By: [name] Entered By: Sandre Kitty on 02/14/2021 16:20:16 -------------------------------------------------------------------------------- Wound Assessment Details Patient Name: Date of Service: [name], Abrham J. 02/14/2021 10:30 A M Medical Record Number: [id number] Patient Account Number: [id number] Date of Birth/Sex: Treating RN: 1951/08/31 (70 y.o. [name] Primary Care  Laurelle Skiver: [name] Kevin Clinician: Referring Lucifer Soja: Treating Lovell Nuttall/Extender: [name] in Treatment: 264 Wound Status Wound Number: [id number] Primary Lymphedema Etiology: Wound Location: Left, Lateral Lower Leg Wound Open Wounding Event: Gradually Appeared Status: Date Acquired: 02/14/2021 Comorbid Chronic sinus problems/congestion, Arrhythmia, Hypertension, Weeks Of Treatment: 0 History: Peripheral Arterial Disease, Type II Diabetes, History of Burn, Gout, Clustered Wound: Yes Confinement Anxiety Photos Wound Measurements Length: (cm) 3 Width: (cm) 2 Depth: (cm) 0.1 Clustered Quantity: 2 Area: (cm) 4.712 Volume: (cm) 0.471 % Reduction in Area: 0% % Reduction in Volume: 0% Epithelialization: Small (1-33%) Tunneling: No Undermining: No Wound Description Classification: Full Thickness Without Exposed Support Structures Wound Margin: Distinct, outline attached Exudate Amount: Medium Exudate Type: Serosanguineous Exudate Color: red, brown Foul Odor After Cleansing: No Slough/Fibrino No Wound Bed Granulation Amount: Large (67-100%) Exposed Structure Granulation Quality: Red Fascia Exposed: No Necrotic Amount: None Present (0%) Fat Layer (Subcutaneous Tissue) Exposed: Yes Tendon Exposed: No Muscle Exposed: No Joint Exposed: No Bone Exposed: No Treatment Notes Wound #194 (Lower Leg) Wound Laterality: Left, Lateral Cleanser Soap and Water Discharge Instruction: May shower and wash wound with dial antibacterial soap and water prior to dressing change. Peri-Wound Care Zinc Oxide Ointment 30g tube Discharge Instruction: Apply Zinc Oxide to periwound with each dressing change Sween Lotion (Moisturizing lotion) Discharge Instruction: Apply moisturizing lotion as  directed Topical Primary Dressing KerraCel Ag Gelling Fiber Dressing, 2x2 in (silver alginate) Discharge Instruction: Apply silver alginate to wound bed as  instructed Secondary Dressing ABD Pad, 8x10 Discharge Instruction: Apply over primary dressing as directed. Secured With Compression Wrap FourPress (4 layer compression wrap) Discharge Instruction: ***Unna boot first layer to upper portion of lower leg.***Apply four layer compression as directed. Compression Stockings Add-Ons Electronic Signature(s) Signed: 02/14/2021 4:36:07 PM By: Sandre Kitty Signed: 02/14/2021 5:55:32 PM By: [name] Previous Signature: 02/14/2021 12:26:47 PM Version By: [name] Entered By: Sandre Kitty on 02/14/2021 16:21:26 -------------------------------------------------------------------------------- Wound Assessment Details Patient Name: Date of Service: [name], Davit J. 02/14/2021 10:30 A M Medical Record Number: [id number] Patient Account Number: [id number] Date of Birth/Sex: Treating RN: [birth year] (70 y.o. [name] Primary Care Woodfin Kiss: [name] Kevin Clinician: Referring Zarianna Dicarlo: Treating Emmaleigh Longo/Extender: [name] in Treatment: 264 Wound Status Wound Number: [id number] Primary Skin T ear Etiology: Wound Location: Left, Anterior Lower Leg Wound Open Wounding Event: Skin Tear/Laceration Status: Date Acquired: 02/14/2021 Comorbid Chronic sinus problems/congestion, Arrhythmia, Hypertension, Weeks Of Treatment: 0 History: Peripheral Arterial Disease, Type II Diabetes, History of Burn, Gout, Clustered Wound: No Confinement Anxiety Photos Wound Measurements Length: (cm) 1.5 Width: (cm) 0.5 Depth: (cm) 0.1 Area: (cm) 0.589 Volume: (cm) 0.059 % Reduction in Area: 0% % Reduction in Volume: 0% Epithelialization: None Tunneling: No Undermining: No Wound Description Classification: Full Thickness Without Exposed Support Structures Wound Margin: Distinct, outline attached Exudate Amount: Medium Exudate Type: Serosanguineous Exudate Color: red, brown Foul Odor After Cleansing:  No Slough/Fibrino No Wound Bed Granulation Amount: Large (67-100%) Exposed Structure Granulation Quality: Red Fascia Exposed: No Necrotic Amount: None Present (0%) Fat Layer (Subcutaneous Tissue) Exposed: Yes Tendon Exposed: No Muscle Exposed: No Joint Exposed: No Bone Exposed: No Treatment Notes Wound #195 (Lower Leg) Wound Laterality: Left, Anterior Cleanser Soap and Water Discharge Instruction: May shower and wash wound with dial antibacterial soap and water prior to dressing change. Peri-Wound Care Zinc Oxide Ointment 30g tube Discharge Instruction: Apply Zinc Oxide to periwound with each dressing change Sween Lotion (Moisturizing lotion) Discharge Instruction: Apply moisturizing lotion as directed Topical Primary Dressing KerraCel Ag Gelling Fiber Dressing, 2x2 in (silver alginate) Discharge Instruction: Apply silver alginate to wound bed as instructed Secondary Dressing ABD Pad, 8x10 Discharge Instruction: Apply over primary dressing as directed. Secured With Compression Wrap FourPress (4 layer compression wrap) Discharge Instruction: ***Unna boot first layer to upper portion of lower leg.***Apply four layer compression as directed. Compression Stockings Add-Ons Electronic Signature(s) Signed: 02/14/2021 4:36:07 PM By: Sandre Kitty Signed: 02/14/2021 5:55:32 PM By: [name] Previous Signature: 02/14/2021 12:26:47 PM Version By: [name] Entered By: Sandre Kitty on 02/14/2021 16:23:20 -------------------------------------------------------------------------------- Vitals Details Patient Name: Date of Service: [name], Rayaan J. 02/14/2021 10:30 A M Medical Record Number: [id number] Patient Account Number: [id number] Date of Birth/Sex: Treating RN: 24-Dec-1950 (70 y.o. [name] Primary Care Shahzad Thomann: [name] Kevin Clinician: Referring Aadon Gorelik: Treating Haron Beilke/Extender: [name] in Treatment: 264 Vital  Signs Time Taken: 10:48 Temperature (F): 98.3 Height (in): 70 Pulse (bpm): 69 Weight (lbs): 380.2 Respiratory Rate (breaths/min): 20 Body Mass Index (BMI): 54.5 Blood Pressure (mmHg): 167/69 Capillary Blood Glucose (mg/dl): 187 Reference Range: 80 - 120 mg / dl Electronic Signature(s) Signed: 02/14/2021 12:26:47 PM By: [name] Entered By: [name] on 02/14/2021 10:58:20

## 2021-02-14 NOTE — Patient Outreach (Signed)
[facility]) Care Management  02/14/2021  [name] 1950/11/23 [id number]   Telephone Assessment-Unsuccessful  Outreach #2 RN attempted outreach call today however unsuccessful. RN able to leave a HIPAA approved voice message requesting a call back.  Will rescheduled another outreach call next week for pending services.  Elliot Cousin, RN Care Management Coordinator [facility] Main Office [phone]

## 2021-02-14 NOTE — Progress Notes (Signed)
DAK, SZUMSKI ([id number]) Visit Report for 02/14/2021 Chief Complaint Document Details Patient Name: Date of Service: [name], [name] 02/14/2021 10:30 A M Medical Record Number: [id number] Patient Account Number: [id number] Date of Birth/Sex: Treating RN: [birth year] (70 y.o. [name] Primary Care Provider: Nicoletta Ba Other Clinician: Referring Provider: Treating Provider/Extender: Debe Coder in Treatment: 264 Information Obtained from: Patient Chief Complaint patient is here for evaluation venous/lymphedema weeping 4/13 - patient is here for evaluation of venous/lymphedema ulcers Electronic Signature(s) Signed: 02/14/2021 12:50:34 PM By: Geralyn Corwin DO Entered By: Geralyn Corwin on 02/14/2021 12:32:55 -------------------------------------------------------------------------------- HPI Details Patient Name: Date of Service: [name], [name] 02/14/2021 10:30 A M Medical Record Number: [id number] Patient Account Number: [id number] Date of Birth/Sex: Treating RN: 05-22-1951 (70 y.o. [name] Primary Care Provider: Nicoletta Ba Other Clinician: Referring Provider: Treating Provider/Extender: Debe Coder in Treatment: 264 History of Present Illness HPI Description: Referred by PCP for consultation. Patient has long standing history of BLE venous stasis, no prior ulcerations. At beginning of month, developed cellulitis and weeping. Received IM Rocephin followed by Keflex and resolved. Wears compression stocking, appr 6 months old. Not sure strength. No present drainage. 01/22/16 this is a patient who is a type II diabetic on insulin. 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 He tells me he religiously wears pressure stockings of uncertain strength. He was here with weeping edema about 8 months ago but did not have an open wound. Roughly a month ago he had a reopening  on his bilateral legs. He is been using bandages and Neosporin. He does not complain of pain. 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 Last BUN/creatinine I have is from 11/20/15 at 13 and 1.0 respectively 01/29/16; patient arrives today having tolerated the Profore wrap. He brought in his stockings and these are 18 mmHg stockings he bought from [place]. The compression here is likely inadequate. He does not complain of pain or excessive drainage she has no systemic symptoms. The wound on the right looks improved as does the one on the left although one on the left is more substantial with still tissue at risk below the actual wound area on the bilateral posterior calf 02/05/16; patient arrives with poor edema control. He states that we did put a 4 layer compression on it last week. No weight appear 5 this. 02/12/16; the area on the posterior right Has healed. The left Has a substantial wound that has necrotic surface eschar that requires a debridement with a curette. 02/16/16;the patient called or a Nurse visit secondary to increased swelling. He had been in earlier in the week with his right leg healed. He was transitioned to is on pressure stocking on the right leg with the only open wound on the left, a substantial area on the left posterior calf. Note he has a history of severe lower extremity edema, he has a history of chronic atrial fibrillation but not heart failure per my notes but I'll need to research this. He is not complaining of chest pain shortness of breath or orthopnea. The intake nurse noted blisters on the previously closed right leg 02/19/16; this is the patient's regular visit day. I see him on Friday with escalating edema new wounds on the right leg and clear signs of at least right ventricular heart failure. I increased his  Lasix to 40 twice a day. He is returning currently in  follow-up. States he is noticed a decrease in that the edema 02/26/16 patient's legs have much less edema. There is nothing really open on the right leg. The left leg has improved condition of the large superficial wound on the posterior left leg 03/04/16; edema control is very much better. The patient's right leg wounds have healed. On the left leg he continues to have severe venous inflammation on the posterior aspect of the left leg. There is no tenderness and I don't think any of this is cellulitis. 03/11/16; patient's right leg is married healed and he is in his own stocking. The patient's left leg has deteriorated somewhat. There is a lot of erythema around the wound on the posterior left leg. There is also a significant rim of erythema posteriorly just above where the wrap would've ended there is a new wound in this location and a lot of tenderness. Can't rule out cellulitis in this area. 03/15/16; patient's right leg remains healed and he is in his own stocking. The patient's left leg is much better than last review. His major wound on the posterior aspect of his left Is almost fully epithelialized. He has 3 small injuries from the wraps. Really. Erythema seems a lot better on antibiotics 03/18/16; right leg remains healed and he is in his own stocking. The patient's left leg is much better. The area on the posterior aspect of the left calf is fully epithelialized. His 3 small injuries which were wrap injuries on the left are improved only one seems still open his erythema has resolved 03/25/16; patient's right leg remains healed and he is in his own stocking. There is no open area today on the left leg posterior leg is completely closed up. His wrap injuries at the superior aspect of his leg are also resolved. He looks as though he has some irritation on the dorsal ankle but this is fully epithelialized without evidence of infection. 03/28/16; we discharged this patient on Monday. Transitioned him  into his own stocking. There were problems almost immediately with uncontrolled swelling weeping edema multiple some of which have opened. He does not feel systemically unwell in particular no chest pain no shortness of breath and he does not feel 04/08/16; the edema is under better control with the Profore light wrap but he still has pitting edema. There is one large wound anteriorly 2 on the medial aspect of his left leg and 3 small areas on the superior posterior calf. Drainage is not excessive he is tolerating a Profore light well 04/15/16; put a Profore wrap on him last week. This is controlled is edema however he had a lot of pain on his left anterior foot most of his wounds are healed 04/22/16 once again the patient has denuded areas on the left anterior foot which he states are because his wrap slips up word. He saw his primary physician today is on Lasix 40 twice a day and states that he his weight is down 20 pounds over the last 3 months. 04/29/16: Much improved. left anterior foot much improved. He is now on Lasix 80 mg per day. Much improved edema control 05/06/16; I was hoping to be able to discharge him today however once again he has blisters at a low level of where the compression was placed last week mostly on his left lateral but also his left medial leg and a small area on the anterior part of the left  foot. 05/09/16; apparently the patient went home after his appointment on 7/4 later in the evening developing pain in his upper medial thigh together with subjective fever and chills although his temperature was not taken. The pain was so intense he felt he would probably have to call 911. However he then remembered that he had leftover doxycycline from a previous round of antibiotics and took these. By the next morning he felt a lot better. He called and spoke to one of our nurses and I approved doxycycline over the phone thinking that this was in relation to the wounds we had previously seen  although they were definitely were not. The patient feels a lot better old fever no chills he is still working. Blood sugars are reasonably controlled 05/13/16; patient is back in for review of his cellulitis on his anterior medial upper thigh. He is taking doxycycline this is a lot better. Culture I did of the nodular area on the dorsal aspect of his foot grew MRSA this also looks a lot better. 05/20/16; the patient is cellulitis on the medial upper thigh has resolved. All of his wound areas including the left anterior foot, areas on the medial aspect of the left calf and the lateral aspect of the calf at all resolved. He has a new blister on the left dorsal foot at the level of the fourth toe this was excised. No evidence of infection 05/27/16; patient continues to complain weeping edema. He has new blisterlike wounds on the left anterior lateral and posterior lateral calf at the top of his wrap levels. The area on his left anterior foot appears better. He is not complaining of fever, pain or pruritus in his feet. 05/30/16; the patient's blisters on his left anterior leg posterior calf all look improved. He did not increase the Lasix 100 mg as I suggested because he was going to run out of his 40 mg tablets. He is still having weeping edema of his toes 06/03/16; I renewed his Lasix at 80 mg once a day as he was about to run out when I last saw him. He is on 80 mg of Lasix now. I have asked him to cut down on the excessive amount of water he was drinking and asked him to drink according to his thirst mechanisms 06/12/2016 -- was seen 2 days ago and was supposed to wear his compression stockings at home but he is developed lymphedema and superficial blisters on the left lower extremity and hence came in for a review 06/24/16; the remaining wound is on his left anterior leg. He still has edema coming from between his toes. There is lymphedema here however his edema is generally better than when I last saw  this. He has a history of atrial fibrillation but does not have a known history of congestive heart failure nevertheless I think he probably has this at least on a diastolic basis. 07/01/16 I reviewed his echocardiogram from January 2017. This was essentially normal. He did not have LVH, EF of 55-60%. His right ventricular function was normal although he did have trivial tricuspid and pulmonic regurgitation. This is not audible on exam however. I increased his Lasix to do massive edema in his legs well above his knees I think in early July. He was also drinking an excessive amount of water at the time. 07/15/16; missed his appointment last week because of the Labor Day holiday on Monday. He could not get another appointment later in the week. Started to feel  the wrap digging in superiorly so we remove the top half and the bottom half of his wrap. He has extensive erythema and blistering superiorly in the left leg. Very tender. Very swollen. Edema in his foot with leaking edema fluid. He has not been systemically unwell 07/22/16; the area on the left leg laterally required some debridement. The medial wounds look more stable. His wrap injury wounds appear to have healed. Edema and his foot is better, weeping edema is also better. He tells me he is meeting with the supplier of the external compression pumps at work 08/05/16; the patient was on vacation last week in [facility]. His wrap is been on for an extended period of time. Also over the weekend he developed an extensive area of tender erythema across his anterior medial thigh. He took to doxycycline yesterday that he had leftover from a previous prescription. The patient complains of weeping edema coming out of his toes 08/08/16; I saw this patient on 10/2. He was tender across his anterior thigh. I put him on doxycycline. He returns today in follow-up. He does not have any open wounds on his lower leg, he still has edema weeping into his  toes. 08/12/16; patient was seen back urgently today to follow-up for his extensive left thigh cellulitis/erysipelas. He comes back with a lot less swelling and erythema pain is much better. I believe I gave him Augmentin and Cipro. His wrap was cut down as he stated a roll down his legs. He developed blistering above the level of the wrap that remained. He has 2 open blisters and 1 intact. 08/19/16; patient is been doing his primary doctor who is increased his Lasix from 40-80 once a day or 80 already has less edema. Cellulitis has remained improved in the left thigh. 2 open areas on the posterior left calf 08/26/16; he returns today having new open blisters on the anterior part of his left leg. He has his compression pumps but is not yet been shown how to use some vital representative from the supplier. 09/02/16 patient returns today with no open wounds on the left leg. Some maceration in his plantar toes 09/10/2016 -- Dr. Leanord Hawking had recently discharged him on 09/02/2016 and he has come right back with redness swelling and some open ulcers on his left lower extremity. He says this was caused by trying to apply his compression stockings and he's been unable to use this and has not been able to use his lymphedema pumps. He had some doxycycline leftover and he has started on this a few days ago. 09/16/16; there are no open wounds on his leg on the left and no evidence of cellulitis. 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 He does not complain of symptoms here. I am not clear use using his external compression pumps. 09/23/16; I have not seen this patient in 2 weeks. He canceled his appointment 10 days ago as he was going on vacation. He tells me that on Monday he noticed a large area on his posterior left leg which is been draining copiously and is reopened into a large wound. He is been using ABDs and the external part of his juxtalite,  according to our nurse this was not on properly. 10/07/16; Still a substantial area on the posterior left leg. Using silver alginate 10/14/16; in general better although there is still open area which looks healthy. Still using silver alginate. He reminds me that this happen  before he left for [facility]. T oday while he was showering in the morning. He had been using his juxtalite's 10/21/16; the area on his posterior left leg is fully epithelialized. However he arrives today with a large area of tender erythema in his medial and posterior left thigh just above the knee. I have marked the area. Once again he is reluctant to consider hospitalization. I treated him with oral antibiotics in the past for a similar situation with resolution I think with doxycycline however this area it seems more extensive to me. He is not complaining of fever but does have chills and says states he is thirsty. His blood sugar today was in the 140s at home 10/25/16 the area on his posterior left leg is fully epithelialized although there is still some weeping edema. The large area of tenderness and erythema in his medial and posterior left thigh is a lot less tender although there is still a lot of swelling in this thigh. He states he feels a lot better. He is on doxycycline and Augmentin that I started last week. This will continued until Tuesday, December 26. I have ordered a duplex ultrasound of the left thigh rule out DVT whether there is an abscess something that would need to be drained I would also like to know. 11/01/16; he still has weeping edema from a not fully epithelialized area on his left posterior calf. Most of the rest of this looks a lot better. He has completed his antibiotics. His thigh is a lot better. Duplex ultrasound did not show a DVT in the thigh 11/08/16; he comes in today with more Denuded surface epithelium from the posterior aspect of his calf. There is no real evidence of cellulitis. The  superior aspect of his wrap appears to have put quite an indentation in his leg just below the knee and this may have contributed. He does not complain of pain or fever. We have been using silver alginate as the primary dressing. The area of cellulitis in the right thigh has totally resolved. He has been using his compression stockings once a week 11/15/16; the patient arrives today with more loss of epithelium from the posterior aspect of his left calf. He now has a fairly substantial wound in this area. The reason behind this deterioration isn't exactly clear although his edema is not well controlled. He states he feels he is generally more swollen systemically. He is not complaining of chest pain shortness of breath fever. T me he has an appointment with his primary physician in early February. He is on 80 mg of oral ells Lasix a day. He claims compliance with the external compression pumps. He is not having any pain in his legs similar to what he has with his recurrent cellulitis 11/22/16; the patient arrives a follow-up of his large area on his left lateral calf. This looks somewhat better today. He came in earlier in the week for a dressing change since I saw him a week ago. He is not complaining of any pain no shortness of breath no chest pain 11/28/16; the patient arrives for follow-up of his large area on the left lateral calf this does not look better. In fact it is larger weeping edema. The surface of the wound does not look too bad. We have been using silver alginate although I'm not certain that this is a dressing issue. 12/05/16; again the patient follows up for a large wound on the left lateral and left posterior calf this  does not look better. There continues to be weeping edema necrotic surface tissue. More worrisome than this once again there is erythema below the wound involving the distal Achilles and heel suggestive of cellulitis. He is on his feet working most of the day of this is  not going well. We are changing his dressing twice a week to facilitate the drainage. 12/12/16; not much change in the overall dimensions of the large area on the left posterior calf. This is very inflamed looking. I gave him an. Doxycycline last week does not really seem to have helped. He found the wrap very painful indeed it seems to of dog into his legs superiorly and perhaps around the heel. He came in early today because the drainage had soaked through his dressings. 12/19/16- patient arrives for follow-up evaluation of his left lower extremity ulcers. He states that he is using his lymphedema pumps once daily when there is "no drainage". He admits to not using his lipedema pumps while under current treatment. His blood sugars have been consistently between 150-200. 12/26/16; the patient is not using his compression pumps at home because of the wetness on his feet. I've advised him that I think it's important for him to use this daily. He finds his feet too wet, he can put a plastic bag over his legs while he is in the pumps. Otherwise I think will be in a vicious circle. We are using silver alginate to the major area on his left posterior calf 01/02/17; the patient's posterior left leg has further of all into 3 open wounds. All of them covered with a necrotic surface. He claims to be using his compression pumps once a day. His edema control is marginal. Continue with silver alginate 01/10/17; the patient's left posterior leg actually looks somewhat better. There is less edema, less erythema. Still has 3 open areas covered with a necrotic surface requiring debridement. He claims to be using his compression pumps once a day his edema control is better 01/17/17; the patient's left posterior calf look better last week when I saw him and his wrap was changed 2 days ago. He has noted increasing pain in the left heel and arrives today with much larger wounds extensive erythema extending down into the entire  heel area especially tender medially. He is not systemically unwell CBGs have been controlled no fever. Our intake nurse showed me limegreen drainage on his AVD pads. 01/24/17; his usual this patient responds nicely to antibiotics last week giving him Levaquin for presumed Pseudomonas. The whole entire posterior part of his leg is much better much less inflamed and in the case of his Achilles heel area much less tender. He has also had some epithelialization posteriorly there are still open areas here and still draining but overall considerably better 01/31/17- He has continue to tolerate the compression wraps. he states that he continues to use the lymphedema pumps daily, and can increase to twice daily on the weekends. He is voicing no complaints or concerns regarding his LLE ulcers 02/07/17-he is here for follow-up evaluation. He states that he noted some erythema to the left medial and anterior thigh, which he states is new as of yesterday. He is concerned about recurrent cellulitis. He states his blood sugars have been slightly elevated, this morning in the 180s 02/14/17; he is here for follow-up evaluation. When he was last here there was erythema superiorly from his posterior wound in his anterior thigh. He was prescribed Levaquin however a culture of the  wound surface grew MRSA over the phone I changed him to doxycycline on Monday and things seem to be a lot better. 02/24/17; patient missed his appointment on Friday therefore we changed his nurse visit into a physician visit today. Still using silver alginate on the large area of the posterior left thigh. He isn't new area on the dorsal left second toe 03/03/17; actually better today although he admits he has not used his external compression pumps in the last 2 days or so because of work responsibilities over the weekend. 03/10/17; continued improvement. External compression pumps once a day almost all of his wounds have closed on the posterior left  calf. Better edema control 03/17/17; in general improved. He still has 3 small open areas on the lateral aspect of his left leg however most of the area on the posterior part of his leg is epithelialized. He has better edema control. He has an ABD pad under his stocking on the right anterior lower leg although he did not let us look at that today. 03/24/17; patient arrives back in clinic today with no open areas however there are areas on the posterior left calf and anterior left calf that are less than 100% epithelialized. His edema is well controlled in the left lower leg. There is some pitting edema probably lymphedema in the left upper thigh. He uses compression pumps at home once per day. I tried to get him to do this twice a day although he is very reticent. 04/01/2017 -- for the last 2 days he's had significant redness, tenderness and weeping and came in for an urgent visit today. 04/07/17; patient still has 6 more days of doxycycline. He was seen by Dr. Meyer Russel last Wednesday for cellulitis involving the posterior aspect, lateral aspect of his Involving his heel. For the most part he is better there is less erythema and less weeping. He has been on his feet for 12 hours 2 over the weekend. Using his compression pumps once a day 04/14/17 arrives today with continued improvement. Only one area on the posterior left calf that is not fully epithelialized. He has intense bilateral venous inflammation associated with his chronic venous insufficiency disease and secondary lymphedema. We have been using silver alginate to the left posterior calf wound In passing he tells [place] today that the right leg but we have not seen in quite some time has an open area on it but he doesn't want [place] to look at this today states he will show this to [place] next week. 04/21/17; there is no open area on his left leg although he still reports some weeping edema. He showed [place] his right leg today which is the first time we've  seen this leg in a long time. He has a large area of open wound on the right leg anteriorly healthy granulation. Quite a bit of swelling in the right leg and some degree of venous inflammation. He told us about the right leg in passing last week but states that deterioration in the right leg really only happened over the weekend 04/28/17; there is no open area on the left leg although there is an irritated part on the posterior which is like a wrap injury. The wound on the right leg which was new from last week at least to [place] is a lot better. 05/05/17; still no open area on the left leg. Patient is using his new compression stocking which seems to be doing a good job of controlling the edema. He  states he is using his compression pumps once per day. The right leg still has an open wound although it is better in terms of surface area. Required debridement. A lot of pain in the posterior right Achilles marked tenderness. Usually this type of presentation this patient gives concern for an active cellulitis 05/12/17; patient arrives today with his major wound from last week on the right lateral leg somewhat better. Still requiring debridement. He was using his compression stocking on the left leg however that is reopened with superficial wounds anteriorly he did not have an open wound on this leg previously. He is still using his juxta light's once daily at night. He cannot find the time to do this in the morning as he has to be at work by 7 AM 05/19/17; right lateral leg wound looks improved. No debridement required. The concerning area is on the left posterior leg which appears to almost have a subcutaneous hemorrhagic component to it. We've been using silver alginate to all the wounds 05/26/17; the right lateral leg wound continues to look improved. However the area on the left posterior calf is a tightly adherent surface. [place] using silver alginate. Because of the weeping edema in his legs there is very  little good alternatives. 06/02/17; the patient left here last week looking quite good. Major wound on the left posterior calf and a small one on the right lateral calf. Both of these look satisfactory. He tells me that by Wednesday he had noted increased pain in the left leg and drainage. He called on Thursday and Friday to get an appointment here but we were blocked. He did not go to urgent care or his primary physician. He thinks he had a fever on Thursday but did not actually take his temperature. He has not been using his compression pumps on the left leg because of pain. I advised him to go to the emergency room today for IV antibiotics for stents of left leg cellulitis but he has refused I have asked him to take 2 days off work to keep his leg elevated and he has refused this as well. In view of this I'm going to call him and Augmentin and doxycycline. 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 However after a detailed discussion regarding the risk benefits and alternatives he refuses to get admitted to the hospital. With no other recourse I will continue him on oral antibiotics as before and hopefully he'll have his infectious disease consultation this week. 06/16/2017 -- the patient was seen today by the nurse practitioner at infectious disease Ms. Dixon. 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 He has also been advise Lotrimin cream locally. she also had wise IV antibiotics for his condition if it flares up 06/23/17; patient arrives today with drainage bilaterally although  the remaining wound on the left posterior calf after cleaning up today "highlighter yellow drainage" did not look too bad. Unfortunately he has had breakdown on the right anterior leg [previously this leg had not been open and he is using a black stocking]  he went to see infectious disease and is been put on clindamycin 150 mg daily, I did not verify the dose although I'm not familiar with using clindamycin in this dosing range, perhaps for prophylaxisoo 06/27/17; I brought this patient back today to follow-up on the wound deterioration on the right lower leg together with surrounding cellulitis. I started him on doxycycline 4 days ago. This area looks better however he comes in today with intense cellulitis on the medial part of his left thigh. This is not have a wound in this area. Extremely tender. We've been using silver alginate to the wounds on the right lower leg left lower leg with bilateral 4 layer compression he is using his external compression pumps once a day 07/04/17; patient's left medial thigh cellulitis looks better. He has not been using his compression pumps as his insert said it was contraindicated with cellulitis. His right leg continues to make improvements all the wounds are still open. We only have one remaining wound on the left posterior calf. Using silver alginate to all open areas. He is on doxycycline which I started a week ago and should be finishing I gave him Augmentin after Thursday's visit for the severe cellulitis on the left medial thigh which fortunately looks better 07/14/17; the patient's left medial thigh cellulitis has resolved. The cellulitis in his right lower calf on the right also looks better. All of his wounds are stable to improved we've been using silver alginate he has completed the antibiotics I have given him. He has clindamycin 150 mg once a day prescribed by infectious disease for prophylaxis, I've advised him to start this now. We have been using  bilateral Unna boots over silver alginate to the wound areas 07/21/17; the patient is been to see infectious disease who noted his recurrent problems with cellulitis. He was not able to tolerate prophylactic clindamycin therefore he is on amoxicillin 500 twice a day. He also had a second daily dose of Lasix added By Dr. Oneta Rack but he is not taking this. Nor is he being completely compliant with his compression pumps a especially not this week. He has 2 remaining wounds one on the right posterior lateral lower leg and one on the left posterior medial lower leg. 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 The patient has Unna boots bilaterally. 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 He has bilateral Unna boots for compression and silver alginate to his wounds. Arrives today with his legs looking as good as I have seen him in quite some time. Not surprisingly his wounds look better as well with improvement on the right lateral leg venous insufficiency wound and also the left medial leg. He is still using the compression pumps once a day 08/11/17; both legs appear to be doing better wounds on the right lateral and left medial legs look better. Skin on the right leg quite good. He is been using silver alginate as the primary dressing. I'm going to use Anasept gel calcium alginate and maintain all the secondary dressings 08/18/17; the patient continues to actually do quite well. The area on his right lateral leg is just about closed the left medial also looks better although it is still moist in this area. His edema is well controlled we have been using  Anasept gel with calcium alginate and the usual secondary dressings, 4 layer compression and once daily use of his compression pumps  "always been able to manage 09/01/17; the patient continues to do reasonably well in spite of his trip to [place]. The area on the right lateral leg is epithelialized. Left is much better but still open. He has more edema and more chronic erythema on the left leg [venous inflammation] 09/08/17; he arrives today with no open wound on the right lateral leg and decently controlled edema. Unfortunately his left leg is not nearly as in his good situation as last week.he apparently had increasing edema starting on Saturday. He edema soaked through into his foot so used a plastic bag to walk around his home. The area on the medial right leg which was his open area is about the same however he has lost surface epithelium on the left lateral which is new and he has significant pain in the Achilles area of the left foot. He is already on amoxicillin chronically for prophylaxis of cellulitis in the left leg 09/15/17; he is completed a week of doxycycline and the cellulitis in the left posterior leg and Achilles area is as usual improved. He still has a lot of edema and fluid soaking through his dressings. There is no open wound on the right leg. He saw infectious disease NP today 09/22/17;As usual 1 we transition him from our compression wraps to his stockings things did not go well. He has several small open areas on the right leg. He states this was caused by the compression wrap on his skin although he did not wear this with the stockings over them. He has several superficial areas on the left leg medially laterally posteriorly. He does not have any evidence of active cellulitis especially involving the left Achilles The patient is traveling from [facility] Saturday going to [facility]. He states he isn't attempting to get an appointment with a heel objects wound center there to change his dressings. I am not completely certain whether this will work 10/06/17; the patient came in on Friday for a nurse visit  and the nurse reported that his legs actually look quite good. He arrives in clinic today for his regular follow-up visit. He has a new wound on his left third toe over the PIP probably caused by friction with his footwear. He has small areas on the left leg and a very superficial but epithelialized area on the right anterior lateral lower leg. Other than that his legs look as good as I've seen him in quite some time. We have been using silver alginate Review of systems; no chest pain no shortness of breath other than this a 10 point review of systems negative 10/20/17; seen by Dr. Meyer Russel last week. He had taken some antibiotics [doxycycline] that he had left over. Dr. Meyer Russel thought he had candida infection and declined to give him further antibiotics. He has a small wound remaining on the right lateral leg several areas on the left leg including a larger area on the left posterior several left medial and anterior and a small wound on the left lateral. The area on the left dorsal third toe looks a lot better. ROS; Gen.; no fever, respiratory no cough no sputum Cardiac no chest pain other than this 10 point review of system is negative 10/30/17; patient arrives today having fallen in the bathtub 3 days ago. It took him a while to get up. He has pain and maceration  in the wounds on his left leg which have deteriorated. He has not been using his pumps he also has some maceration on the right lateral leg. 11/03/17; patient continues to have weeping edema especially in the left leg. This saturates his dressings which were just put on on 12/27. As usual the doxycycline seems to take care of the cellulitis on his lower leg. He is not complaining of fever, chills, or other systemic symptoms. He states his leg feels a lot better on the doxycycline I gave him empirically. He also apparently gets injections at his primary doctor's officeo Rocephin for cellulitis prophylaxis. I didn't ask him about his  compression pump compliance today I think that's probably marginal. Arrives in the clinic with all of his dressings primary and secondary macerated full of fluid and he has bilateral edema 11/10/17; the patient's right leg looks some better although there is still a cluster of wounds on the right lateral. The left leg is inflamed with almost circumferential skin loss medially to laterally although we are still maintaining anteriorly. He does not have overt cellulitis there is a lot of drainage. He is not using compression pumps. We have been using silver alginate to the wound areas, there are not a lot of options here 11/17/17; the patient's right leg continues to be stable although there is still open wounds, better than last week. The inflammation in the left leg is better. Still loss of surface layer epithelium especially posteriorly. There is no overt cellulitis in the amount of edema and his left leg is really quite good, tells me he is using his compression pumps once a day. 11/24/17; patient's right leg has a small superficial wound laterally this continues to improve. The inflammation in the left leg is still improving however we have continuous surface layer epithelial loss posteriorly. There is no overt cellulitis in the amount of edema in both legs is really quite good. He states he is using his compression pumps on the left leg once a day for 5 out of 7 days 12/01/17; very small superficial areas on the right lateral leg continue to improve. Edema control in both legs is better today. He has continued loss of surface epithelialization and left posterior calf although I think this is better. We have been using silver alginate with large number of absorptive secondary dressings 4 layer on the left Unna boot on the right at his request. He tells me he is using his compression pumps once a day 12/08/17; he has no open area on the right leg is edema control is good here. On the left leg however he has  marked erythema and tenderness breakdown of skin. He has what appears to be a wrap injury just distal to the popliteal fossa. 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 There was swelling erythema and breakdown in the right leg. He has much worse skin breakdown in the left leg as well multiple open areas medially and posteriorly as well as laterally. He tells me he has been using his compression pumps but tells me he feels that the drainage out of his leg is worse when he uses a compression pumps. T be fair to him he is been saying this o  for a while however I don't know that I have really been listening to this. I wonder if the compression pumps are working properly 12/22/17;. Once again he arrives with severe erythema, weeping edema from the left greater than right leg. Noncompliance with compression pumps. New this visit he is complaining of pain on the lateral aspect of the right leg and the medial aspect of his right thigh. He apparently saw his cardiologist Dr. [name] who was ordered an echocardiogram area and I think this is a step in the right direction 12/25/17; started his doxycycline Monday night. There is still intense erythema of the right leg especially in the anterior thigh although there is less tenderness. The erythema around the wound on the right lateral calf also is less tender. He still complaining of pain in the left heel. His wounds are about the same right lateral left medial left lateral. Superficial but certainly not close to closure. He denies being systemically unwell no fever chills no abdominal pain no diarrhea 12/29/17; back in follow-up of his extensive right calf and right thigh cellulitis. I added  amoxicillin to cover possible doxycycline resistant strep. This seems to of done the trick he is in much less pain there is much less erythema and swelling. He has his echocardiogram at 11:00 this morning. X-ray of the left heel was also negative. 01/05/18; the patient arrived with his edema under much better control. Now that he is retired he is able to use his compression pumps daily and sometimes twice a day per the patient. He has a wound on the right leg the lateral wound looks better. Area on the left leg also looks a lot better. He has no evidence of cellulitis in his bilateral thighs I had a quick peak at his echocardiogram. He is in normal ejection fraction and normal left ventricular function. He has moderate pulmonary hypertension moderately reduced right ventricular function. One would have to wonder about chronic sleep apnea although he says he doesn't snore. He'll review the echocardiogram with his cardiologist. 01/12/18; the patient arrives with the edema in both legs under exemplary control. He is using his compression pumps daily and sometimes twice daily. His wound on the right lateral leg is just about closed. He still has some weeping areas on the posterior left calf and lateral left calf although everything is just about closed here as well. I have spoken with Aldean Baker who is the patient's nurse practitioner and infectious disease. She was concerned that the patient had not understood that the parenteral penicillin injections he was receiving for cellulitis prophylaxis was actually benefiting him. I don't think the patient actually saw that I would tend to agree we were certainly dealing with less infections although he had a serious one last month. 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 He is healed. He'll be placed in juxtalite compression wraps and increase his lymphedema pumps to twice daily. We will follow up again next week to  ensure there are no issues with the new regiment. 01/20/18-he is here for evaluation of bilateral lower extremity weeping edema. Yesterday he was placed in compression wrap to the right lower extremity and compression stocking to left lower shrubbery. He states he uses lymphedema pumps last night and again this morning and noted a blister to the left lower extremity. On exam he was noted to have drainage to the right lower extremity. He will be placed in Unna boots bilaterally and follow-up next week 01/26/18;  patient was actually discharged a week ago to his own juxta light stockings only to return the next day with bilateral lower extremity weeping edema.he was placed in bilateral Unna boots. He arrives today with pain in the back of his left leg. There is no open area on the right leg however there is a linear/wrap injury on the left leg and weeping edema on the left leg posteriorly. I spoke with infectious disease about 10 days ago. They were disappointed that the patient elected to discontinue prophylactic intramuscular penicillin shots as they felt it was particularly beneficial in reducing the frequency of his cellulitis. I discussed this with the patient today. He does not share this view. He'll definitely need antibiotics today. Finally he is traveling to [place] and trauma leaving this Saturday and returning a week later and he does not travel with his pumps. He is going by car 01/30/18; patient was seen 4 days ago and brought back in today for review of cellulitis in the left leg posteriorly. I put him on amoxicillin this really hasn't helped as much as I might like. He is also worried because he is traveling to [facility] by car. Finally we will be rewrapping him. There is no open area on the right leg over his left leg has multiple weeping areas as usual 02/09/18; The same wrap on for 10 days. He did not pick up the last doxycycline I prescribed for him. He apparently took 4 days worth  he already had. There is nothing open on his right leg and the edema control is really quite good. He's had damage in the left leg medially and laterally especially probably related to the prolonged use of Unna boots 02/12/18; the patient arrived in clinic today for a nurse visit/wrap change. He complained of a lot of pain in the left posterior calf. He is taking doxycycline that I previously prescribed for him. Unfortunately even though he used his stockings and apparently used to compression pumps twice a day he has weeping edema coming out of the lateral part of his right leg. This is coming from the lower anterior lateral skin area. 02/16/18; the patient has finished his doxycycline and will finish the amoxicillin 2 days. The area of cellulitis in the left calf posteriorly has resolved. He is no longer having any pain. He tells me he is using his compression pumps at least once a day sometimes twice. 02/23/18; the patient finished his doxycycline and Amoxil last week. On Friday he noticed a small erythematous circle about the size of a quarter on the left lower leg just above his ankle. This rapidly expanded and he now has erythema on the lateral and posterior part of the thigh. This is bright red. Also has an area on the dorsal foot just above his toes and a tender area just below the left popliteal fossa. He came off his prophylactic penicillin injections at his own insistence one or 2 months ago. This is obviously deteriorated since then 03/02/18; patient is on doxycycline and Amoxil. Culture I did last week of the weeping area on the back of his left calf grew group B strep. I have therefore renewed the amoxicillin 500 3 times a day for a further week. He has not been systemically unwell. Still complaining of an area of discomfort right under his left popliteal fossa. There is no open wound on the right leg. He tells me that he is using his pumps twice a day on most days 03/09/18; patient  arrives in  clinic today completing his amoxicillin today. The cellulitis on his left leg is better. Furthermore he tells me that he had intramuscular penicillin shots that his primary care office today. However he also states that the wrap on his right leg fell down shortly after leaving clinic last week. He developed a large blister that was present when he came in for a nurse visit later in the week and then he developed intense discomfort around this area.He tells me he is using his compression pumps 03/16/18; the patient has completed his doxycycline. The infectious part of this/cellulitis in the left heel area left popliteal area is a lot better. He has 2 open areas on the right calf. Still areas on the left calf but this is a lot better as well. 03/24/18; the patient arrives complaining of pain in the left popliteal area again. He thinks some of this is wrap injury. He has no open area on the right leg and really no open area on the left calf either except for the popliteal area. He claims to be compliant with the compression pumps 03/31/18; I gave him doxycycline last week because of cellulitis in the left popliteal area. This is a lot better although the surface epithelium is denuded off and response to this. He arrives today with uncontrolled edema in the right calf area as well as a fingernail injury in the right lateral calf. There is only a few open areas on the left 04/06/18; I gave him amoxicillin doxycycline over the last 2 weeks that the amoxicillin should be completing currently. He is not complaining of any pain or systemic symptoms. The only open areas see has is on the right lateral lower leg paradoxically I cannot see anything on the left lower leg. He tells me he is using his compression pumps twice a day on most days. Silver alginate to the wounds that are open under 4 layer compression 04/13/18; he completed antibiotics and has no new complaints. Using his compression pumps. Silver alginate that  anything that's opened 04/20/18; he is using his compression pumps religiously. Silver alginate 4 layer compression anything that's opened. He comes in today with no open wounds on the left leg but 3 on the right including a new one posteriorly. He has 2 on the right lateral and one on the right posterior. He likes Unna boots on the right leg for reasons that aren't really clear we had the usual 4 layer compression on the left. It may be necessary to move to the 4 layer compression on the right however for now I left them in the Unna boots 04/27/18; he is using his compression pumps at least once a day. He has still the wounds on the right lateral calf. The area right posteriorly has closed. He does not have an open wound on the left under 4 layer compression however on the dorsal left foot just proximal to the toes and the left third toe 2 small open areas were identified 05/11/18; he has not uses compression pumps. The areas on the right lateral calf have coalesced into one large wound necrotic surface. On the left side he has one small wound anteriorly however the edema is now weeping out of a large part of his left leg. He says he wasn't using his pumps because of the weeping fluid. I explained to him that this is the time he needs to pump more 05/18/18; patient states he is using his compression pumps twice a day. The  area on the right lateral large wound albeit superficial. On the left side he has innumerable number of small new wounds on the left calf particularly laterally but several anteriorly and medially. All these appear to have healthy granulated base these look like the remnants of blisters however they occurred under compression. The patient arrives in clinic today with his legs somewhat better. There is certainly less edema, less multiple open areas on the left calf and the right anterior leg looks somewhat better as well superficial and a little smaller. However he relates pain and  erythema over the last 3-4 days in the thigh and I looked at this today. He has not been systemically unwell no fever no chills no change in blood sugar values 05/25/18; comes in today in a better state. The severe cellulitis on his left leg seems better with the Keflex. Not as tender. He has not been systemically unwell Hard to find an open wound on the left lower leg using his compression pumps twice a day The confluent wounds on his right lateral calf somewhat better looking. These will ultimately need debridement I didn't do this today. 06/01/18; the severe cellulitis on the left anterior thigh has resolved and he is completed his Keflex. There is no open wound on the left leg however there is a superficial excoriation at the base of the third toe dorsally. Skin on the bottom of his left foot is macerated looking. The left the wounds on the lateral right leg actually looks some better although he did require debridement of the top half of this wound area with an open curet 06/09/18 on evaluation today patient appears to be doing poorly in regard to his right lower extremity in particular this appears to likely be infected he has very thick purulent discharge along with a bright green tent to the discharge. This makes me concerned about the possibility of pseudomonas. He's also having increased discomfort at this point on evaluation. Fortunately there does not appear to be any evidence of infection spreading to the other location at this time. 06/16/18 on evaluation today patient appears to actually be doing fairly well. His ulcer has actually diminished in size quite significantly at this point which is good news. Nonetheless he still does have some evidence of infection he did see infectious disease this morning before coming here for his appointment. I did review the results of their evaluation and their note today. They did actually have him discontinue the Cipro and initiate treatment with  linezolid at this time. He is doing this for the next seven days and they recommended a follow-up in four months with them. He is the keep a log of the need for intermittent antibiotic therapy between now and when he falls back up with infectious disease. This will help them gaze what exactly they need to do to try and help them out. 06/23/18; the patient arrives today with no open wounds on the left leg and left third toe healed. He is been using his compression pumps twice a day. On the right lateral leg he still has a sizable wound but this is a lot better than last time I saw this. 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 Has been using silver alginate under 4 layer compression 06/30/18; the only open wound he has is on the right lateral leg and this looks healthy. No debridement is required.  We have been using silver alginate. He does not have an open wound on the left leg. There is apparently some drainage from the dorsal proximal third toe on the left although I see no open wound here. 07/03/18 on evaluation today patient was actually here just for a nurse visit rapid change. However when he was here on Wednesday for his rat change due to having been healed on the left and then developing blisters we initiated the wrap again knowing that he would be back today for [place] to reevaluate and see were at. Unfortunately he has developed some cellulitis into the proximal portion of his right lower extremity even into the region of his thigh. 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 He was placed on one as what at that point. Nonetheless he is done with that and has been tolerating it well otherwise. Doxycycline which in the past really did not seem to be effective for him. Nonetheless I think the best option may be for [place] to definitely reinitiate the  antibiotics for a longer period of time. 07/07/18; since I last saw this patient a week ago he has had a difficult time. At that point he did not have an open wound on his left leg. We transitioned him into juxta light stockings. He was apparently in the clinic the next day with blisters on the left lateral and left medial lower calf. He also had weeping edema fluid. He was put back into a compression wrap. He was also in the clinic on Friday with intense erythema in his right thigh. Per the patient he was started on Bactrim however that didn't work at all in terms of relieving his pain and swelling. He has taken 3 doxycycline that he had left over from last time and that seems to of helped. He has blistering on the right thigh as well. 07/14/18; the erythema on his right thigh has gotten better with doxycycline that he is finishing. 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 Presumably this cellulitis in the thigh was not related to that although I think this is a bit concerning going forward. He still has an area on the right lateral calf the blister on the right medial calf just below the knee that was discussed above. On the left 2 small open areas left medial and left lateral. Edema control is adequate. He is using his compression pumps twice a day 07/20/18; continued improvement in the condition of both legs especially the edema in his bilateral thighs. He tells me he is been losing weight through a combination of diet and exercise. He is using his compression pumps twice a day. So overall she made to the remaining wounds 07/27/2018; continued improvement in condition of both legs. His edema is well controlled. The area on the right lateral leg is just about closed he had one blisters show up on the medial left upper calf. We have him in 4 layer compression. He is going on a 10-day trip to [place], [place] and [place].  He will be driving. He wants to wear Unna boots because of the lessening amount of constriction. He will not use compression pumps while he is away 08/05/18 on evaluation today patient actually appears to be doing decently well all things considered in regard to his bilateral lower extremities. The worst ulcer is actually only posterior  aspect of his left lower extremity with a four layer compression wrap cut into his leg a couple weeks back. He did have a trip and actually had [employer] for the trip that he is worn since he was last here. Nonetheless he feels like the [employer] actually do better for him his swelling is up a little bit but he also with his trip was not taking his Lasix on a regular set schedule like he was supposed to be. He states that obviously the reason being that he cannot drive and keep going without having to urinate too frequently which makes it difficult. He did not have his pumps with him while he was away either which I think also maybe playing a role here too. 08/13/2018; the patient only has a small open wound on the right lateral calf which is a big improvement in the last month or 2. He also has the area posteriorly just below the posterior fossa on the left which I think was a wrap injury from several weeks ago. He has no current evidence of cellulitis. He tells me he is back into his compression pumps twice a day. He also tells me that while he was at the laundromat somebody stole a section of his extremitease stockings 08/20/2018; back in the clinic with a much improved state. He only has small areas on the right lateral mid calf which is just about healed. This was is more substantial area for quite a prolonged period of time. He has a small open area on the left anterior tibia. The area on the posterior calf just below the popliteal fossa is closed today. He is using his compression pumps twice a day 08/28/2018; patient has no open wound on the right  leg. He has a smattering of open areas on the calf with some weeping lymphedema. More problematically than that it looks as though his wraps of slipped down in his usual he has very angry upper area of edema just below the right medial knee and on the right lateral calf. He has no open area on his feet. The patient is traveling to [facility] next week. I will send him in an antibiotic. We will continue to wrap the right leg. We ordered extremitease stockings for him last week and I plan to transition the right leg to a stocking when he gets home which will be in 10 days time. As usual he is very reluctant to take his pumps with him when he travels 09/07/2018; patient returns from [facility]. He shows me a picture of his left leg in the mid part of his trip last week with intense fire engine erythema. The picture look bad enough I would have considered sending him to the hospital. Instead he went to the wound care center in [facility]. They did not prescribe him antibiotics but he did take some doxycycline he had leftover from a previous visit. I had given him trimethoprim sulfamethoxazole before he left this did not work according to the patient. This is resulted in some improvement fortunately. He comes back with a large wound on the left posterior calf. Smaller area on the left anterior tibia. Denuded blisters on the dorsal left foot over his toes. Does not have much in the way of wounds on the right leg although he does have a very tender area on the right posterior area just below the popliteal fossa also suggestive of infection. He promises me he is back  on his pumps twice a day 09/15/2018; the intense cellulitis in his left lower calf is a lot better. The wound area on the posterior left calf is also so better. However he has reasonably extensive wounds on the dorsal aspect of his second and third toes and the proximal foot just at the base of the toes. There is nothing open on the  right leg 09/22/2018; the patient has excellent edema control in his legs bilaterally. He is using his external compression pumps twice a day. He has no open area on the right leg and only the areas in the left foot dorsally second and third toe area on the left side. He does not have any signs of active cellulitis. 10/06/2018; the patient has good edema control bilaterally. He has no open wound on the right leg. There is a blister in the posterior aspect of his left calf that we had to deal with today. He is using his compression pumps twice a day. There is no signs of active cellulitis. We have been using silver alginate to the wound areas. He still has vulnerable areas on the base of his left first second toes dorsally He has a his extremities stockings and we are going to transition him today into the stocking on the right leg. He is cautioned that he will need to continue to use the compression pumps twice a day. If he notices uncontrolled edema in the right leg he may need to go to 3 times a day. 10/13/2018; the patient came in for a nurse check on Friday he has a large flaccid blister on the right medial calf just below the knee. We unroofed this. He has this and a new area underneath the posterior mid calf which was undoubtedly a blister as well. He also has several small areas on the right which is the area we put his extremities stocking on. 10/19/2018; the patient went to see infectious disease this morning I am not sure if that was a routine follow-up in any case the doxycycline I had given him was discontinued and started on linezolid. He has not started this. It is easy to look at his left calf and the inflammation and think this is cellulitis however he is very tender in the tissue just below the popliteal fossa and I have no doubt that there is infection going on here. He states the problem he is having is that with the compression pumps the edema goes down and then starts walking the  wrap falls down. We will see if we can adhere this. He has 1 or 2 minuscule open areas on the right still areas that are weeping on the posterior left calf, the base of his left second and third toes 10/26/18; back today in clinic with quite of skin breakdown in his left anterior leg. This may have been infection the area below the popliteal fossa seems a lot better however tremendous epithelial loss on the left anterior mid tibia area over quite inexpensive tissue. He has 2 blisters on the right side but no other open wound here. 10/29/2018; came in urgently to see [place] today and we worked him in for review. He states that the 4 layer compression on the right leg caused pain he had to cut it down to roughly his mid calf this caused swelling above the wrap and he has blisters and skin breakdown today. As a result of the pain he has not been using his pumps. Both legs are a  lot more edematous and there is a lot of weeping fluid. 11/02/18; arrives in clinic with continued difficulties in the right leg> left. Leg is swollen and painful. multiple skin blisters and new open areas especially laterally. He has not been using his pumps on the right leg. 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 He is not systemically unwell. 11/09/2018; the patient claims he is being compliant with his pumps. He is finished the doxycycline I gave him last week. Culture I did of the wound on the right lateral leg showed a few very resistant methicillin staph aureus. This was resistant to doxycycline. Nevertheless he states the pain in the leg is a lot better which makes me wonder if the cultured organism was not really what was causing the problem nevertheless this is a very dangerous organism to be culturing out of any wound. His right leg is still a lot larger than the left. He is using an Radio broadcast assistant on this area, he blames a 4-layer compression for causing the original skin  breakdown which I doubt is true however I cannot talk him out of it. We have been using silver alginate to all of these areas which were initially blisters 11/16/2018; patient is being compliant with his external compression pumps at twice a day. Miraculously he arrives in clinic today with absolutely no open wounds. He has better edema control on the left where he has been using 4 layer compression versus wound of wounds on the right and I pointed this out to him. There is no inflammation in the skin in his lower legs which is also somewhat unusual for him. There is no open wounds on the dorsal left foot. He has extremitease stockings at home and I have asked him to bring these in next week. 11/25/18 patient's lower extremity on examination today on the left appears for the most part to be wound free. He does have an open wound on the lateral aspect of the right lower extremity but this is minimal compared to what I've seen in past. He does request that we go ahead and wrap the left leg as well even though there's nothing open just so hopefully it will not reopen in short order. 1/28; patient has superficial open wounds on the right lateral calf left anterior calf and left posterior calf. His edema control is adequate. 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 We have been using silver alginate as the primary dressing. He claims compliance with his compression pumps 2/4; patient has superficial open wounds on numerous areas of his left calf and again one on the left dorsal foot. The areas on the right lateral calf have healed. The cellulitis that I gave him doxycycline for last week is also resolved this was mostly on the left anterior calf just below the tibial tuberosity. His edema looks fairly well-controlled. He tells me he went to see his primary doctor today and had blood work ordered 2/11; once again he has  several open areas on the left calf left tibial area. Most of these are small and appear to have healthy granulation. He does not have anything open on the right. The edema and control in his thighs is pretty good which is usually a good indication he has been using his pumps as requested. 2/18; he continues to have several small areas on the left calf and  left tibial area. Most of these are small healthy granulation. We put him in his stocking on the right leg last week and he arrives with a superficial open area over the right upper tibia and a fairly large area on the right lateral tibia in similar condition. His edema control actually does not look too bad, he claims to be using his compression pumps twice a day 2/25. Continued small areas on the left calf and left tibial area. New areas especially on the right are identified just below the tibial tuberosity and on the right upper tibia itself. There are also areas of weeping edema fluid even without an obvious wound. He does not have a considerable degree of lymphedema but clearly there is more edema here than his skin can handle. He states he is using the pumps twice a day. We have an Unna boot on the right and 4 layer compression on the left. 3/3; he continues to have an area on the right lateral calf and right posterior calf just below the popliteal fossa. There is a fair amount of tenderness around the wound on the popliteal fossa but I did not see any evidence of cellulitis, could just be that the wrap came down and rubbed in this area. He does not have an open area on the left leg however there is an area on the left dorsal foot at the base of the third toe We have been using silver alginate to all wound areas 3/10; he did not have an open area on his left leg last time he was here a week ago. T oday he arrives with a horizontal wound just below the tibial tuberosity and an area on the left lateral calf. He has intense erythema and  tenderness in this area. The area is on the right lateral calf and right posterior calf better than last week. We have been using silver alginate as usual 3/18 - Patient returns with 3 small open areas on left calf, and 1 small open area on right calf, the skin looks ok with no significant erythema, he continues the UNA boot on right and 4 layer compression on left. The right lateral calf wound is closed , the right posterior is small area. we will continue silver alginate to the areas. 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 He is been tolerating the dressing changes without complication. Fortunately she has made excellent improvement in regard to the overall status of his wounds. Unfortunately every time we cease wrapping him he ends up reopening in causing more significant issues at that point. Again I'm unsure of the best direction to take although I think the lymphedema clinic may be appropriate for him. 02/03/19 on evaluation today patient appears to be doing well in regard to the wounds that we saw him for last week unfortunately he has a new area on the proximal portion of his right medial/posterior lower extremity where the wrap somewhat slowed down and caused swelling and a blister to rub and open. Unfortunately this is the only opening that he has on either leg at this point. 02/17/19 on evaluation today patient's bilateral lower extremities appear to be doing well. He still completely healed in regard to the left lower extremity. In regard to the right lower extremity the area where the wrap and  slid down and caused the blister still seems to be slightly open although this is dramatically better than during the last evaluation two weeks ago. I'm very pleased with the way this stands  overall. 03/03/19 on evaluation today patient appears to be doing well in regard to his right lower extremity in general although he did have a new blister open this does not appear to be showing any evidence of active infection at this time. Fortunately there's No fevers, chills, nausea, or vomiting noted at this time. Overall I feel like he is making good progress it does feel like that the right leg will we perform the [employer] seems to do with a bit better than three layer wrap on the left which slid down on him. We may switch to doing bilateral in the book wraps. 5/4; I have not seen Mr. Mendonca in quite some time. According to our case manager he did not have an open wound on his left leg last week. He had 1 remaining wound on the right posterior medial calf. He arrives today with multiple openings on the left leg probably were blisters and/or wrap injuries from Unna boots. I do not think the Unna boot's will provide adequate compression on the left. I am also not clear about the frequency he is using the compression pumps. 03/17/19 on evaluation today patient appears to be doing excellent in regard to his lower extremities compared to last week's evaluation apparently. He had gotten significantly worse last week which is unfortunate. The [employer] wrap on the left did not seem to do very well for him at all and in fact it didn't control his swelling significantly enough he had an additional outbreak. Subsequently we go back to the four layer compression wrap on the left. This is good news. At least in that he is doing better and the wound seem to be killing him. He still has not heard anything from the lymphedema clinic. 03/24/19 on evaluation today patient actually appears to be doing much better in regard to his bilateral lower Trinity as compared to last week when I saw him. Fortunately there's no signs of active infection at this time. He has been tolerating the dressing changes without  complication. Overall I'm extremely pleased with the progress and appearance in general. 04/07/19 on evaluation today patient appears to be doing well in regard to his bilateral lower extremities. His swelling is significantly down from where it was previous. With that being said he does have a couple blisters still open at this point but fortunately nothing that seems to be too severe and again the majority of the larger openings has healed at this time. 04/14/19 on evaluation today patient actually appears to be doing quite well in regard to his bilateral lower extremities in fact I'm not even sure there's anything significantly open at this time at any site. Nonetheless he did have some trouble with these wraps where they are somewhat irritating him secondary to the fact that he has noted that the graph wasn't too close down to the end of this foot in a little bit short as well up to his knee. Otherwise things seem to be doing quite well. 04/21/19 upon evaluation today patient's wound bed actually showed evidence of being completely healed in regard to both lower extremities which is excellent news. There does not appear to be any signs of active infection which is also good news. I'm very pleased in this regard. No fevers, chills, nausea, or  vomiting noted at this time. 04/28/19 on evaluation today patient appears to be doing a little bit worse in regard to both lower extremities on the left mainly due to the fact that when he went infection disease the wrap was not wrapped quite high enough he developed a blister above this. On the right he is a small open area of nothing too significant but again this is continuing to give him some trouble he has been were in the Velcro compression that he has at home. 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 He's been tolerating the dressing changes without complication. Fortunately there's no signs of  active infection at this time. No fevers, chills, nausea, or vomiting noted at this time. We have been trying to get an appointment with her lymphedema clinic in [facility] but unfortunately nobody can get them on phone with not been able to even fax information over the patient likewise is not been able to get in touch with them. Overall I'm not sure exactly what's going on here with to reach out again today. 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 Still having a lot of drainage unfortunately. He tells me especially in the left but even on the right. There's no signs of active infection which is good news we've been using so ratcheted up to this point. 05/19/19 on evaluation today patient actually appears to be doing quite well with regard to his left lower extremity which is great news. Fortunately in regard to the right lower extremity has an issues with his wrap and he subsequently did remove this from what I'm understanding. Nonetheless long story short is what he had rewrapped once he removed it subsequently had maggots underneath this wrap whenever he came in for evaluation today. With that being said they were obviously completely cleaned away by the nursing staff. The visit today which is excellent news. However he does appear to potentially have some infection around the right ankle region where the maggots were located as well. He will likely require anabiotic therapy today. 05/26/19 on evaluation today patient actually appears to be doing much better in regard to his bilateral lower extremities. I feel like the infection is under much better control. With that being said there were maggots noted when the wrap was removed yet again today. Again this could have potentially been left over from previous although at this time there does not appear to be any signs of significant drainage there  was obviously on the wrap some drainage as well this contracted gnats or otherwise. Either way I do not see anything that appears to be doing worse in my pinion and in fact I think his drainage has slowed down quite significantly likely mainly due to the fact to his infection being under better control. 06/02/2019 on evaluation today patient actually appears to be doing well with regard to his bilateral lower extremities there is no signs of active infection at this time which is great news. With that being said he does have several open areas more so on the right than the left but nonetheless these are all significantly better than previously noted. 06/09/2019 on evaluation today patient actually appears to be doing well. His wrap stayed up and he did not cause any problems he had more drainage on the right compared to the left but overall I do  not see any major issues at this time which is great news. 06/16/2019 on evaluation today patient appears to be doing excellent with regard to his lower extremities the only area that is open is a new blister that can have opened as of today on the medial ankle on the left. Other than this he really seems to be doing great I see no major issues at this point. 06/23/2019 on evaluation today patient appears to be doing quite well with regard to his bilateral lower extremities. In fact he actually appears to be almost completely healed there is a small area of weeping noted of the right lower extremity just above the ankle. Nonetheless fortunately there is no signs of active infection at this time which is good news. No fevers, chills, nausea, vomiting, or diarrhea. 8/24; the patient arrived for a nurse visit today but complained of very significant pain in the left leg and therefore I was asked to look at this. Noted that he did not have an open area on the left leg last week nevertheless this was wrapped. The patient states that he is not been able to put his  compression pumps on the left leg because of the discomfort. He has not been systemically unwell 06/30/2019 on evaluation today patient unfortunately despite being excellent last week is doing much worse with regard to his left lower extremity today. In fact he had to come in for a nurse on Monday where his left leg had to be rewrapped due to excessive weeping Dr. Leanord Hawking placed him on doxycycline at that point. Fortunately there is no signs of active infection Systemically at this time which is good news. 07/07/2019 in regard to the patient's wounds today he actually seems to be doing well with his right lower extremity there really is nothing open or draining at this point this is great news. Unfortunately the left lower extremity is given him additional trouble at this time. There does not appear to be any signs of active infection nonetheless he does have a lot of edema and swelling noted at this point as well as blistering all of which has led to a much more poor appearing leg at this time compared to where it was 2 weeks ago when it was almost completely healed. Obviously this is a little discouraging for the patient. He is try to contact the lymphedema clinic in [place] he has not been able to get through to them. 07/14/2019 on evaluation today patient actually appears to be doing slightly better with regard to his left lower extremity ulcers. Overall I do feel like at least at the top of the wrap that we have been placing this area has healed quite nicely and looks much better. The remainder of the leg is showing signs of improvement. Unfortunately in the thigh area he still has an open region on the left and again on the right he has been utilizing just a Band-Aid on an area that also opened on the thigh. Again this is an area that were not able to wrap although we did do an Ace wrap to provide some compression that something that obviously is a little less effective than the compression wraps  we have been using on the lower portion of the leg. He does have an appointment with the lymphedema clinic in [facility] on Friday. 07/21/2019 on evaluation today patient appears to be doing better with regard to his lower extremity ulcers. He has been tolerating the dressing changes without complication. Fortunately  there is no signs of active infection at this time. No fevers, chills, nausea, vomiting, or diarrhea. I did receive the paperwork from the physical therapist at the lymphedema clinic in [place]. Subsequently I signed off on that this morning and sent that back to him for further progression with the treatment plan. 07/28/2019 on evaluation today patient appears to be doing very well with regard to his right lower extremity where I do not see any open wounds at this point. Fortunately he is feeling great as far as that is concerned as well. In regard to the left lower extremity he has been having issues with still several areas of weeping and edema although the upper leg is doing better his lower leg still I think is going require the compression wrap at this time. No fevers, chills, nausea, vomiting, or diarrhea. 08/04/2019 on evaluation today patient unfortunately is having new wounds on the right lower extremity. Again we have been using Unna boot wrap on that side. We switched him to using his juxta lite wrap at home. With that being said he tells me he has been using it although his legs extremely swollen and to be honest really does not appear that he has been. I cannot know that for sure however. Nonetheless he has multiple new wounds on the right lower extremity at this time. Obviously we will have to see about getting this rewrapped for him today. 08/11/2019 on evaluation today patient appears to be doing fairly well with regard to his wounds. He has been tolerating the dressing changes including the compression wraps without complication. He still has a lot of edema in his  upper thigh regions bilaterally he is supposed to be seeing the lymphedema clinic on the 15th of this month once his wraps arrive for the upper part of his legs. 08/18/2019 on evaluation today patient appears to be doing well with regard to his bilateral lower extremities at this point. He has been tolerating the dressing changes without complication. Fortunately there is no signs of active infection which is also good news. He does have a couple weeping areas on the first and second toe of the right foot he also has just a small area on the left foot upper leg and a small area on the left lower leg but overall he is doing quite well in my opinion. He is supposed to be getting his wraps shortly in fact tomorrow and then subsequently is seeing the lymphedema clinic next Wednesday on the 21st. Of note he is also leaving on the 25th to go on vacation for a week to the beach. For that reason and since there is some uncertainty about what there can be doing at lymphedema clinic next Wednesday I am get a make an appointment for next Friday here for [place] to see what we need to do for him prior to him leaving for vacation. 10/23; patient arrives in considerable pain predominantly in the upper posterior calf just distal to the popliteal fossa also in the wound anteriorly above the major wound. This is probably cellulitis and he has had this recurrently in the past. He has no open wound on the right side and he has had an Radio broadcast assistant in that area. Finally I note that he has an area on the left posterior calf which by enlarge is mostly epithelialized. This protrudes beyond the borders of the surrounding skin in the setting of dry scaly skin and lymphedema. The patient is leaving for [facility] on  Sunday. Per his longstanding pattern, he will not take his compression pumps with him predominantly out of fear that they will be stolen. He therefore asked that we put a Unna boot back on the right leg. He will also  contact the wound care center in [facility] to see if they can change his dressing in the mid week. 11/3; patient returned from his vacation to [facility]. He was seen on 1 occasion at their wound care center. They did a 2 layer compression system as they did not have our 4-layer wrap. I am not completely certain what they put on the wounds. They did not change the Unna boot on the right. The patient is also seeing a lymphedema specialist physical therapist in [place]. It appears that he has some compression sleeve for his thighs which indeed look quite a bit better than I am used to seeing. He pumps over these with his external compression pumps. 11/10; the patient has a new wound on the right medial thigh otherwise there is no open areas on the right. He has an area on the left leg posteriorly anteriorly and medially and an area over the left second toe. We have been using silver alginate. He thinks the injury on his thigh is secondary to friction from the compression sleeve he has. 11/17; the patient has a new wound on the right medial thigh last week. He thinks this is because he did not have a underlying stocking for his thigh juxta lite apparatus. He now has this. The area is fairly large and somewhat angry but I do not think he has underlying cellulitis. He has a intact blister on the right anterior tibial area. Small wound on the right great toe dorsally Small area on the medial left calf. 11/30; the patient does not have any open areas on his right leg and we did not take his juxta lite stocking off. However he states that on Friday his compression wrap fell down lodging around his upper mid calf area. As usual this creates a lot of problems for him. He called urgently today to be seen for a nurse visit however the nurse visit turned into a provider visit because of extreme erythema and pain in the left anterior tibia extending laterally and posteriorly. The area that is  problematic is extensive 10/06/2019 upon evaluation today patient actually appears to be doing poorly in regard to his left lower extremity. He Dr. Leanord Hawking did place him on doxycycline this past Monday apparently due to the fact that he was doing much worse in regard to this left leg. Fortunately the doxycycline does seem to be helping. Unfortunately we are still having a very difficult time getting his edema under any type of control in order to anticipate discharge at some point. The only way were really able to control his lymphedema really is with compression wraps and that has only even seemingly temporary. He has been seeing a lymphedema clinic they are trying to help in this regard but still this has been somewhat frustrating in general for the patient. 10/13/19 on evaluation today patient appears to be doing excellent with regard to his right lower extremity as far as the wounds are concerned. His swelling is still quite extensive unfortunately. He is still having a lot of drainage from the thigh areas bilaterally which is unfortunate. He's been going to lymphedema clinic but again he still really does not have this edema under control as far as his lower extremities are concern.  With regard to his left lower extremity this seems to be improving and I do believe the doxycycline has been of benefit for him. He is about to complete the doxycycline. 10/20/2019 on evaluation today patient appears to be doing poorly in regard to his bilateral lower extremities. More in the right thigh he has a lot of irritation at this site unfortunately. In regard to the left lower extremity the wrap was not quite as high it appears and does seem to have caused him some trouble as well. Fortunately there is no evidence of systemic infection though he does have some blue-green drainage which has me concerned for the possibility of Pseudomonas. He tells me he is previously taking Cipro without complications and he really  does not care for Levaquin however due to some of the side effects he has. He is not allergic to any medications specifically antibiotics that were aware of. 10/27/2019 on evaluation today patient actually does appear to be for the most part doing better when compared to last week's evaluation. With that being said he still has multiple open wounds over the bilateral lower extremities. He actually forgot to start taking the Cipro and states that he still has the whole bottle. He does have several new blisters on left lower extremity today I think I would recommend he go ahead and take the Cipro based on what I am seeing at this point. 12/30-Patient comes at 1 week visit, 4 layer compression wraps on the left and Unna boot on the right, primary dressing Xtrasorb and silver alginate. Patient is taking his Cipro and has a few more days left probably 5-6, and the legs are doing better. He states he is using his compressions devices which I believe he has 11/10/2019 on evaluation today patient actually appears to be much better than last time I saw him 2 weeks ago. His wounds are significantly improved and overall I am very pleased in this regard. Fortunately there is no signs of active infection at this time. He is just a couple of days away from completing Cipro. Overall his edema is much better he has been using his lymphedema pumps which I think is also helping at this point. 11/17/2019 on evaluation today patient appears to be doing excellent in regard to his wounds in general. His legs are swollen but not nearly as much as they have been in the past. Fortunately he is tolerating the compression wraps without complication. No fevers, chills, nausea, vomiting, or diarrhea. He does have some erythema however in the distal portion of his right lower extremity specifically around the forefoot and toes there is a little bit of warmth here as well. 11/24/2019 on evaluation today patient appears to be doing well  with regard to his right lower extremity I really do not see any open wounds at this point. His left lower extremity does have several open areas and his right medial thigh also is open. Other than this however overall the patient seems to be making good progress and I am very pleased at this point. 12/01/2019 on evaluation today patient appears to be doing poorly at this point in regard to his left lower extremity has several new blisters despite the fact that we have him in compression wraps. In fact he had a 4-layer compression wrap, his upper thigh wrapped from lymphedema clinic, and a juxta light over top of the 4 layer compression wrap the lymphedema clinic applied and despite all this he still develop blisters underneath. Obviously  this does have me concerned about the fact that unfortunately despite what we are doing to try to get wounds healed he continues to have new areas arise I do not think he is ever good to be at the point where he can realistically just use wraps at home to keep things under control. Typically when we heal him it takes about 1-2 days before he is back in the clinic with severe breakdown and blistering of his lower extremities bilaterally. This is happened numerous times in the past. Unfortunately I think that we may need some help as far as overall fluid overload to kind of limit what we are seeing and get things under better control. 12/08/2019 on evaluation today patient presents for follow-up concerning his ongoing bilateral lower extremity edema. Unfortunately he is still having quite a bit of swelling the compression wraps are controlling this to some degree but he did see Dr. [name] his cardiologist I do have that available for review today as far as the appointment was concerned that was on 12/06/2019. Obviously that she has been 2 days ago. The patient states that he is only been taking the Lasix 80 mg 1 time a day he had told me previously he was taking this twice a  day. Nonetheless Dr. [name] recommended this be up to 80 mg 2 times a day for the patient as he did appear to be fluid overloaded. With that being said the patient states he did this yesterday and he was unable to go anywhere or do anything due to the fact that he was constantly having to urinate. Nonetheless I think that this is still good to be something that is important for him as far as trying to get his edema under control at all things that he is going to be able to just expect his wounds to get under control and things to be better without going through at least a period of time where he is trying to stabilize his fluid management in general and I think increasing the Lasix is likely the first step here. It was also mentioned the possibility that the patient may require metolazone. With that being said he wanted to have the patient take Lasix twice a day first and then reevaluating 2 months to see where things stand. 12/15/2019 upon evaluation today patient appears to be doing regard to his legs although his toes are showing some signs of weeping especially on the left at this point to some degree on the right. There does not appear to be any signs of active infection and overall I do feel like the compression wraps are doing well for him but he has not been able to take the Lasix at home and the increased dose that Dr. [name] recommended. He tells me that just not go to be feasible for him. Nonetheless I think in this case he should probably send a message to Dr. [name] in order to discuss options from the standpoint of possible admission to get the fluid off or otherwise going forward. 12/22/2019 upon evaluation today patient appears to be doing fairly well with regard to his lower extremities at this point. In fact he would be doing excellent if it was not for the fact that his right anterior thigh apparently had an allergic reaction to adhesive tape that he used. The wound itself that we have  been monitoring actually appears to be healed. There is a lot of irritation at this point. 12/29/2019 upon evaluation today patient  appears to be doing well in regard to his lower extremities. His left medial thigh is open and somewhat draining today but this is the only region that is open the right has done much better with the treatment utilizing the steroid cream that I prescribed for him last week. Overall I am pleased in that regard. Fortunately there is no signs of active infection at this time. No fevers, chills, nausea, vomiting, or diarrhea. 01/05/2020 upon evaluation today patient appears to be doing more poorly in regard to his right lower extremity at this point upon evaluation today. Unfortunately he continues to have issues in this regard and I think the biggest issue is controlling his edema. This obviously is not very well controlled at this point is been recommended that he use the Lasix twice a day but he has not been able to do that. Unfortunately I think this is leading to an issue where honestly he is not really able to effectively control his edema and therefore the wounds really are not doing significantly better. I do not think that he is going to be able to keep things under good control unless he is able to control his edema much better. I discussed this again in great detail with him today. 01/12/2020 good news is patient actually appears to be doing quite well today at this point. He does have an appointment with lymphedema clinic tomorrow. His legs appear healed and the toe on the left is almost completely healed. In general I am very pleased with how things stand at this point. 01/19/2020 upon evaluation today patient appears to actually be doing well in regard to his lower extremities there is nothing open at this point. Fortunately he has done extremely well more recently. Has been seeing lymphedema clinic as well. With that being said he has Velcro wraps for his lower legs  as well as his upper legs. The only wound really is on his toe which is the right great toe and this is barely anything even there. With all that being said I think it is good to be appropriate today to go ahead and switch him over to the Velcro compression wraps. 01/26/2020 upon evaluation today patient appears to be doing worse with regard to his lower extremities after last week switch him to Velcro compression wraps. Unfortunately he lasted less than 24 hours he did not have the sock portion of his Velcro wrap on the left leg and subsequently developed a blister underneath the Velcro portion. Obviously this is not good and not what we were looking for at this point. He states the lymphedema clinic did tell him to wear the wrap for 23 hours and take him off for 1 I am okay with that plan but again right now we got a get things back under control again he may have some cellulitis noted as well. 02/02/2020 upon evaluation today patient unfortunately appears to have several areas of blistering on his bilateral lower extremities today mainly on the feet. His legs do seem to be doing somewhat better which is good news. Fortunately there is no evidence of active infection at this time. No fevers, chills, nausea, vomiting, or diarrhea. 02/16/2020 upon evaluation today patient appears to be doing well at this time with regard to his legs. He has a couple weeping areas on his toes but for the most part everything is doing better and does appear to be sealed up on his legs which is excellent news. We can continue with  wrapping him at this point as he had every time we discontinue the wraps he just breaks out with new wounds. There is really no point in is going forward with this at this point. 03/08/2020 upon evaluation today patient actually appears to be doing quite well with regard to his lower extremity ulcers. He has just a very superficial and really almost nonexistent blister on the left lower extremity he  has in general done very well with the compression wraps. With that being said I do not see any signs of infection at this time which is good news. 03/29/2020 upon evaluation today patient appears to be doing well with regard to his wounds currently except for where he had several new areas that opened up due to some of the wrap slipping and causing him trouble. He states he did not realize they had slipped. Nonetheless he has a 1 area on the right and 3 new areas on the left. Fortunately there is no signs of active infection at this time which is great news. 04/05/2020 upon evaluation today patient actually appears to be doing quite well in general in regard to his legs currently. Fortunately there is no signs of active infection at this time. No fevers, chills, nausea, vomiting, or diarrhea. He tells me next week that he will actually be seen in the lymphedema clinic on Thursday at 10 AM I see him on Wednesday next week. 04/12/2020 upon evaluation today patient appears to be doing very well with regard to his lower extremities bilaterally. In fact he does not appear to have any open wounds at this point which is good news. Fortunately there is no signs of active infection at this time. No fevers, chills, nausea, vomiting, or diarrhea. 04/19/2020 upon evaluation today patient appears to be doing well with regard to his wounds currently on the bilateral lower extremities. There does not appear to be any signs of active infection at this time. Fortunately there is no evidence of systemic infection and overall very pleased at this point. Nonetheless after I held him out last week he literally had blisters the next morning already which swelled up with him being right back here in the clinic. Overall I think that he is just not can be able to be discharged with his legs the way they are he is much to volume overloaded as far as fluid is concerned and that was discussed with him today of also discussed this but  should try the clinic nurse manager as well as Dr. Leanord Hawking. 04/26/2020 upon evaluation today patient appears to be doing better with regard to his wounds currently. He is making some progress and overall swelling is under good control with the compression wraps. Fortunately there is no evidence of active infection at this time. 05/10/2020 on evaluation today patient appears to be doing overall well in regard to his lower extremities bilaterally. He is Tolerating the compression wraps without complication and with what we are seeing currently I feel like that he is making excellent progress. There is no signs of active infection at this time. 05/24/2020 upon evaluation today patient appears to be doing well in regard to his legs. The swelling is actually quite a bit down compared to where it has been in the past. Fortunately there is no sign of active infection at this time which is also good news. With that being said he does have several wounds on his toes that have opened up at this point. 05/31/2020 upon evaluation today patient  appears to be doing well with regard to his legs bilaterally where he really has no significant fluid buildup at this point overall he seems to be doing quite well. Very pleased in this regard. With regard to his toes these also seem to be drying up which is excellent. We have continue to wrap him as every time we tried as a transition to the juxta light wraps things just do not seem to get any better. 06/07/2020 upon evaluation today patient appears to be doing well with regard to his right leg at this point. Unfortunately left leg has a lot of blistering he tells me the wrap started to slide down on him when he tried to put his other Velcro wrap over top of it to help keep things in order but nonetheless still had some issues. 06/14/2020 on evaluation today patient appears to be doing well with regard to his lower extremity ulcers and foot ulcers at this point. I feel like  everything is actually showing signs of improvement which is great news overall there is no signs of active infection at this time. No fevers, chills, nausea, vomiting, or diarrhea. 06/21/2020 on evaluation today patient actually appears to be doing okay in regard to his wounds in general. With that being said the biggest issue I see is on his right foot in particular the first and second toe seem to be doing a little worse due to the fact this is staying very wet. I think he is probably getting need to change out his dressings a couple times in between each week when we see him in regard to his toes in order to keep this drier based on the location and how this is proceeding. 06/28/2020 on evaluation today patient appears to be doing a little bit more poorly overall in regard to the appearance of the skin I am actually somewhat concerned about the possibility of him having a little bit of an infection here. We discussed the course of potentially giving him a doxycycline prescription which he is taken previously with good result. With that being said I do believe that this is potentially mild and at this point easily fixed. I just do not want anything to get any worse. 07/12/2020 upon evaluation today patient actually appears to be making some progress with regard to his legs which is great news there does not appear to be any evidence of active infection. Overall very pleased with where things stand. 07/26/2020 upon evaluation today patient appears to be doing well with regard to his leg ulcers and toe ulcers at this point. He has been tolerating the compression wraps without complication overall very pleased in this regard. 08/09/2020 upon evaluation today patient appears to be doing well with regard to his lower extremities bilaterally. Fortunately there is no signs of active infection overall I am pleased with where things stand. 08/23/2020 on evaluation today patient appears to be doing well with  regard to his wound. He has been tolerating the dressing changes without complication. Fortunately there is no signs of active infection at this time. Overall his legs seem to be doing quite well which is great news and I am very pleased in that regard. No fevers, chills, nausea, vomiting, or diarrhea. 09/13/2020 upon evaluation today patient appears to be doing okay in regard to his lower extremities. He does have a fairly large blister on the right leg which I did remove the blister tissue from today so we can get this to dry out  other than that however he seems to be doing quite well. There is no signs of active infection at this time. 09/27/2020 upon evaluation today patient appears to actually be doing some better in regard to his right leg. Fortunately signs of active infection at this time which is great news. No fevers, chills, nausea, vomiting, or diarrhea. 10/04/2020 upon evaluation today patient actually appears to be showing signs of improvement which is great news with regard to his leg ulcers. Fortunately there is no signs of active infection which is great news he is still taking the antibiotics currently. No fevers, chills, nausea, vomiting, or diarrhea. 10/18/2020 on evaluation today patient appears to be doing well with regard to his legs currently. He has been tolerating the dressing changes including the wraps without complication. Fortunately there is no signs of active infection at this time. No fevers, chills, nausea, vomiting, or diarrhea. 10/25/2020 upon evaluation today patient appears to be doing decently well in regard to his wounds currently. He has been tolerating the dressing changes without complication. Overall I feel like he is making good progress albeit slow. Again this is something we can have to continue to wrap for some time to come most likely. 11/08/2020 upon evaluation today patient appears to be doing well with regard to his wounds currently. He has been  tolerating the dressing changes without complication is not currently on any antibiotics and he does not appear to show any signs of infection. He does continue to have a lot of drainage on the right leg not too severe but nonetheless this is very scattered. On the left leg this is looking to be much improved overall. 11/15/2020 upon evaluation today patient appears to be doing better with regard to his legs bilaterally. Especially the right leg which was much more significant last week. There does not appear to be any signs of active infection which is great news. No fevers, chills, nausea, vomiting, or diarrhea. 11/23/2019 upon evaluation today patient appears to be doing poorly still in regard to his lower extremities bilaterally. Unfortunately his right leg in particular appears to be doing much more poorly there is no signs really of infection this is not warm to touch but he does have a lot of drainage and weeping unfortunately. With that reason I do believe that we may need to initiate some treatment here to try to help calm down some of the swelling of the right leg. I think switching to a 4-layer compression wrap would be beneficial here. The patient is in agreement with giving this a try. 11/29/2020 upon evaluation today patient appears to be doing well currently in regard to his leg ulcers. I feel like the right leg is doing better he still has a lot of drainage but we do see some improvement here. The 4-layer compression wrap I think was helpful. 12/06/2020 upon evaluation today patient appears to be doing well with regard to his legs. In fact they seem to be doing about the best I have seen up to this point. Fortunately there is no signs of active infection at this time. No fevers, chills, nausea, vomiting, or diarrhea. 12/20/2020 upon evaluation today patient appears to be doing well at this time in regard to his legs. He is not having any significant draining which is great news. Fortunately  there is no signs of active infection at this time. No fevers, chills, nausea, vomiting, or diarrhea. 01/17/2021 upon evaluation today Ezekiel actually appears to be doing excellent in regard to  his legs. He has a few areas again that come and go as far as his toes are concerned but overall this is doing quite well. 01/31/2021 upon evaluation today patient appears to be doing well with regard to his legs. Fortunately there does not appear to be any signs of active infection which is great news. Overall he is still having significant edema despite the compression wraps basically the 4-layer compression wrap to just keep things under control there is really not much room for play. 4/13: Mr. Amer is a longstanding patient in our clinic and benefits greatly from weekly compression wraps. Today he has no complaints. He has been tolerating the wraps well. He states he is using the lymphedema pumps at home. Electronic Signature(s) Signed: 02/14/2021 12:50:34 PM By: Geralyn Corwin DO Entered By: Geralyn Corwin on 02/14/2021 12:34:56 -------------------------------------------------------------------------------- Physical Exam Details Patient Name: Date of Service: [name], [name] 02/14/2021 10:30 A M Medical Record Number: [id number] Patient Account Number: [id number] Date of Birth/Sex: Treating RN: 10-19-1951 (70 y.o. [name] Primary Care Provider: Nicoletta Ba Other Clinician: Referring Provider: Treating Provider/Extender: Debe Coder in Treatment: 264 Constitutional respirations regular, non-labored and within target range for patient.. Notes Left lower extremity: Patient has a lateral wound limited to skin breakdown. This appears well-healing with good granulation tissue. He also has a skin tear to the anterior shin that appears traumatic to me. The second left toe also has skin breakdown that has slightly worsened since last clinic visit. He has small  blisters forming scattered throughout his right lower extremity. Both extremities are warm to the touch without signs of infection. He still has significant edema to the thigh bilaterally Electronic Signature(s) Signed: 02/14/2021 12:50:34 PM By: Geralyn Corwin DO Entered By: Geralyn Corwin on 02/14/2021 12:40:24 -------------------------------------------------------------------------------- Physician Orders Details Patient Name: Date of Service: [name], [name] 02/14/2021 10:30 A M Medical Record Number: [id number] Patient Account Number: [id number] Date of Birth/Sex: Treating RN: 12-14-1950 (70 y.o. 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 - Dr. Mikey Bussing 03/07/2021 Nurse Visit: - 1 week- 02/21/2021 2 week- 02/28/2021 Bathing/ Shower/ Hygiene May shower with protection but do not get wound dressing(s) wet. Edema Control - Lymphedema / SCD / Other Bilateral Lower Extremities Lymphedema Pumps. Use Lymphedema pumps on leg(s) 2-3 times a day for 45-60 minutes. If wearing any wraps or hose, do not remove them. Continue exercising as instructed. Elevate legs to the level of the heart or above for 30 minutes daily and/or when sitting, a frequency of: - throughout the  day Avoid standing for long periods of time. Exercise regularly Non Wound Condition Bilateral Lower Extremities Other Non Wound Condition Orders/Instructions: - lotion to both legs, 4 layer compression wraps right and left legs Wound Treatment Electronic Signature(s) Signed: 02/14/2021 12:50:34 PM By: Geralyn Corwin DO Previous Signature: 02/14/2021 12:26:47 PM Version By: Shawn Stall Entered By: Geralyn Corwin on 02/14/2021 12:40:40 --------------------------------------------------------------------------------  Problem List Details Patient Name: Date of Service: [name], [name] 02/14/2021 10:30 A M Medical Record Number: [id number] Patient Account Number: [id number] Date of Birth/Sex: Treating RN: 09-06-1951 (70 y.o. Bayard Hugger, Bonita Quin Primary Care Provider: Nicoletta Ba Other Clinician: Referring Provider: Treating Provider/Extender: Debe Coder in Treatment: 264 Active Problems ICD-10 Encounter Code Description Active Date MDM Diagnosis L97.521 Non-pressure chronic ulcer of other 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 of 04/27/2018 No Yes skin 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 of 01/22/2016 No Yes bilateral lower extremity I89.0 Lymphedema, not elsewhere classified 01/22/2016 No Yes E11.622 Type 2 diabetes mellitus with other skin ulcer 01/22/2016 No Yes 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, unspecified 01/22/2016 No Yes Inactive Problems ICD-10 Code Description Active Date Inactive Date L03.115 Cellulitis of right lower limb 12/22/2017 12/22/2017 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 06/30/2018 06/30/2018 L97.511 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 of skin 06/30/2018 06/30/2018 Resolved Problems ICD-10 Code Description Active Date Resolved Date L03.116 Cellulitis of left lower limb 04/01/2017  04/01/2017 L97.221 Non-pressure chronic ulcer of left calf limited to breakdown of skin 09/30/2016 09/30/2016 L97.211 Non-pressure chronic ulcer of right calf limited to breakdown of skin 06/30/2017 06/30/2017 L97.211 Non-pressure chronic ulcer of right calf limited to breakdown of skin 06/30/2018 06/30/2018 Electronic Signature(s) Signed: 02/14/2021 12:50:34 PM By: Geralyn Corwin DO Entered By: Geralyn Corwin on 02/14/2021 12:49:05 -------------------------------------------------------------------------------- Progress Note/History and Physical Details Patient Name: Date of Service: [name], [name] 02/14/2021 10:30 A M Medical Record Number: [id number] Patient Account Number: [id number] Date of Birth/Sex: Treating RN: 1951/04/19 (70 y.o. [name] Primary Care Provider: Nicoletta Ba Other Clinician: Referring Provider: Treating Provider/Extender: Debe Coder in Treatment: 264 Subjective Chief Complaint Information obtained from Patient patient is here for evaluation venous/lymphedema weeping 4/13 - patient is here for evaluation of venous/lymphedema ulcers History of Present Illness (HPI) Referred by PCP for consultation. Patient has long standing history of BLE venous stasis, no prior ulcerations. At beginning of month, developed cellulitis and weeping. Received IM Rocephin followed by Keflex and resolved. Wears compression stocking, appr 6 months old. Not sure strength. No present drainage. 01/22/16 this is a patient who is a type II diabetic on insulin. 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 He tells me he religiously wears pressure stockings of uncertain strength. He was here with weeping edema about 8 months ago but did not have an open wound. Roughly a month ago he had a reopening on his bilateral legs. He is been using bandages and Neosporin. He does not complain of pain. 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 Last BUN/creatinine I have is from 11/20/15 at 13 and 1.0 respectively 01/29/16; patient arrives today having tolerated the Profore wrap. He brought in his stockings and these are 18 mmHg stockings he bought from [employer]. The compression here is likely inadequate. He does not complain of pain or excessive drainage she has no systemic symptoms. The wound on the right looks improved as does the one on the left although one on the left is more substantial with still tissue at risk below the actual wound area on the bilateral posterior calf 02/05/16; patient arrives with poor edema control. He states that we did put a 4 layer compression  on it last week. No weight appear 5 this. 02/12/16; the area on the posterior right Has healed. The left Has a substantial wound that has necrotic surface eschar that requires a debridement with a curette. 02/16/16;the patient called or a Nurse visit secondary to increased swelling. He had been in earlier in the week with his right leg healed. He was transitioned to is on pressure stocking on the right leg with the only open wound on the left, a substantial area on the left posterior calf. Note he has a history of severe lower extremity edema, he has a history of chronic atrial fibrillation but not heart failure per my notes but I'll need to research this. He is not complaining of chest pain shortness of breath or orthopnea. The intake nurse noted blisters on the previously closed right leg 02/19/16; this is the patient's regular visit day. I see him on Friday with escalating edema new wounds on the right leg and clear signs of at least right ventricular heart failure. I increased his Lasix to 40 twice a day. He is returning currently in follow-up. States he is noticed a decrease in that the edema 02/26/16 patient's legs have much less edema. There is  nothing really open on the right leg. The left leg has improved condition of the large superficial wound on the posterior left leg 03/04/16; edema control is very much better. The patient's right leg wounds have healed. On the left leg he continues to have severe venous inflammation on the posterior aspect of the left leg. There is no tenderness and I don't think any of this is cellulitis. 03/11/16; patient's right leg is married healed and he is in his own stocking. The patient's left leg has deteriorated somewhat. There is a lot of erythema around the wound on the posterior left leg. There is also a significant rim of erythema posteriorly just above where the wrap would've ended there is a new wound in this location and a lot of tenderness. Can't rule out cellulitis in this area. 03/15/16; patient's right leg remains healed and he is in his own stocking. The patient's left leg is much better than last review. His major wound on the posterior aspect of his left Is almost fully epithelialized. He has 3 small injuries from the wraps. Really. Erythema seems a lot better on antibiotics 03/18/16; right leg remains healed and he is in his own stocking. The patient's left leg is much better. The area on the posterior aspect of the left calf is fully epithelialized. His 3 small injuries which were wrap injuries on the left are improved only one seems still open his erythema has resolved 03/25/16; patient's right leg remains healed and he is in his own stocking. There is no open area today on the left leg posterior leg is completely closed up. His wrap injuries at the superior aspect of his leg are also resolved. He looks as though he has some irritation on the dorsal ankle but this is fully epithelialized without evidence of infection. 03/28/16; we discharged this patient on Monday. Transitioned him into his own stocking. There were problems almost immediately with uncontrolled swelling weeping edema multiple some  of which have opened. He does not feel systemically unwell in particular no chest pain no shortness of breath and he does not feel 04/08/16; the edema is under better control with the Profore light wrap but he still has pitting edema. There is one large wound anteriorly 2 on the medial aspect of his  left leg and 3 small areas on the superior posterior calf. Drainage is not excessive he is tolerating a Profore light well 04/15/16; put a Profore wrap on him last week. This is controlled is edema however he had a lot of pain on his left anterior foot most of his wounds are healed 04/22/16 once again the patient has denuded areas on the left anterior foot which he states are because his wrap slips up word. He saw his primary physician today is on Lasix 40 twice a day and states that he his weight is down 20 pounds over the last 3 months. 04/29/16: Much improved. left anterior foot much improved. He is now on Lasix 80 mg per day. Much improved edema control 05/06/16; I was hoping to be able to discharge him today however once again he has blisters at a low level of where the compression was placed last week mostly on his left lateral but also his left medial leg and a small area on the anterior part of the left foot. 05/09/16; apparently the patient went home after his appointment on 7/4 later in the evening developing pain in his upper medial thigh together with subjective fever and chills although his temperature was not taken. The pain was so intense he felt he would probably have to call 911. However he then remembered that he had leftover doxycycline from a previous round of antibiotics and took these. By the next morning he felt a lot better. He called and spoke to one of our nurses and I approved doxycycline over the phone thinking that this was in relation to the wounds we had previously seen although they were definitely were not. The patient feels a lot better old fever no chills he is still working. Blood  sugars are reasonably controlled 05/13/16; patient is back in for review of his cellulitis on his anterior medial upper thigh. He is taking doxycycline this is a lot better. Culture I did of the nodular area on the dorsal aspect of his foot grew MRSA this also looks a lot better. 05/20/16; the patient is cellulitis on the medial upper thigh has resolved. All of his wound areas including the left anterior foot, areas on the medial aspect of the left calf and the lateral aspect of the calf at all resolved. He has a new blister on the left dorsal foot at the level of the fourth toe this was excised. No evidence of infection 05/27/16; patient continues to complain weeping edema. He has new blisterlike wounds on the left anterior lateral and posterior lateral calf at the top of his wrap levels. The area on his left anterior foot appears better. He is not complaining of fever, pain or pruritus in his feet. 05/30/16; the patient's blisters on his left anterior leg posterior calf all look improved. He did not increase the Lasix 100 mg as I suggested because he was going to run out of his 40 mg tablets. He is still having weeping edema of his toes 06/03/16; I renewed his Lasix at 80 mg once a day as he was about to run out when I last saw him. He is on 80 mg of Lasix now. I have asked him to cut down on the excessive amount of water he was drinking and asked him to drink according to his thirst mechanisms 06/12/2016 -- was seen 2 days ago and was supposed to wear his compression stockings at home but he is developed lymphedema and superficial blisters on the left  lower extremity and hence came in for a review 06/24/16; the remaining wound is on his left anterior leg. He still has edema coming from between his toes. There is lymphedema here however his edema is generally better than when I last saw this. He has a history of atrial fibrillation but does not have a known history of congestive heart failure nevertheless  I think he probably has this at least on a diastolic basis. 07/01/16 I reviewed his echocardiogram from January 2017. This was essentially normal. He did not have LVH, EF of 55-60%. His right ventricular function was normal although he did have trivial tricuspid and pulmonic regurgitation. This is not audible on exam however. I increased his Lasix to do massive edema in his legs well above his knees I think in early July. He was also drinking an excessive amount of water at the time. 07/15/16; missed his appointment last week because of the Labor Day holiday on Monday. He could not get another appointment later in the week. Started to feel the wrap digging in superiorly so we remove the top half and the bottom half of his wrap. He has extensive erythema and blistering superiorly in the left leg. Very tender. Very swollen. Edema in his foot with leaking edema fluid. He has not been systemically unwell 07/22/16; the area on the left leg laterally required some debridement. The medial wounds look more stable. His wrap injury wounds appear to have healed. Edema and his foot is better, weeping edema is also better. He tells me he is meeting with the supplier of the external compression pumps at work 08/05/16; the patient was on vacation last week in [facility]. His wrap is been on for an extended period of time. Also over the weekend he developed an extensive area of tender erythema across his anterior medial thigh. He took to doxycycline yesterday that he had leftover from a previous prescription. The patient complains of weeping edema coming out of his toes 08/08/16; I saw this patient on 10/2. He was tender across his anterior thigh. I put him on doxycycline. He returns today in follow-up. He does not have any open wounds on his lower leg, he still has edema weeping into his toes. 08/12/16; patient was seen back urgently today to follow-up for his extensive left thigh cellulitis/erysipelas. He comes back  with a lot less swelling and erythema pain is much better. I believe I gave him Augmentin and Cipro. His wrap was cut down as he stated a roll down his legs. He developed blistering above the level of the wrap that remained. He has 2 open blisters and 1 intact. 08/19/16; patient is been doing his primary doctor who is increased his Lasix from 40-80 once a day or 80 already has less edema. Cellulitis has remained improved in the left thigh. 2 open areas on the posterior left calf 08/26/16; he returns today having new open blisters on the anterior part of his left leg. He has his compression pumps but is not yet been shown how to use some vital representative from the supplier. 09/02/16 patient returns today with no open wounds on the left leg. Some maceration in his plantar toes 09/10/2016 -- Dr. Leanord Hawking had recently discharged him on 09/02/2016 and he has come right back with redness swelling and some open ulcers on his left lower extremity. He says this was caused by trying to apply his compression stockings and he's been unable to use this and has not been able to use  his lymphedema pumps. He had some doxycycline leftover and he has started on this a few days ago. 09/16/16; there are no open wounds on his leg on the left and no evidence of cellulitis. 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 He does not complain of symptoms here. I am not clear use using his external compression pumps. 09/23/16; I have not seen this patient in 2 weeks. He canceled his appointment 10 days ago as he was going on vacation. He tells me that on Monday he noticed a large area on his posterior left leg which is been draining copiously and is reopened into a large wound. He is been using ABDs and the external part of his juxtalite, according to our nurse this was not on properly. 10/07/16; Still a substantial area on the posterior left leg. Using silver  alginate 10/14/16; in general better although there is still open area which looks healthy. Still using silver alginate. He reminds me that this happen before he left for [facility]. T oday while he was showering in the morning. He had been using his juxtalite's 10/21/16; the area on his posterior left leg is fully epithelialized. However he arrives today with a large area of tender erythema in his medial and posterior left thigh just above the knee. I have marked the area. Once again he is reluctant to consider hospitalization. I treated him with oral antibiotics in the past for a similar situation with resolution I think with doxycycline however this area it seems more extensive to me. He is not complaining of fever but does have chills and says states he is thirsty. His blood sugar today was in the 140s at home 10/25/16 the area on his posterior left leg is fully epithelialized although there is still some weeping edema. The large area of tenderness and erythema in his medial and posterior left thigh is a lot less tender although there is still a lot of swelling in this thigh. He states he feels a lot better. He is on doxycycline and Augmentin that I started last week. This will continued until Tuesday, December 26. I have ordered a duplex ultrasound of the left thigh rule out DVT whether there is an abscess something that would need to be drained I would also like to know. 11/01/16; he still has weeping edema from a not fully epithelialized area on his left posterior calf. Most of the rest of this looks a lot better. He has completed his antibiotics. His thigh is a lot better. Duplex ultrasound did not show a DVT in the thigh 11/08/16; he comes in today with more Denuded surface epithelium from the posterior aspect of his calf. There is no real evidence of cellulitis. The superior aspect of his wrap appears to have put quite an indentation in his leg just below the knee and this may have  contributed. He does not complain of pain or fever. We have been using silver alginate as the primary dressing. The area of cellulitis in the right thigh has totally resolved. He has been using his compression stockings once a week 11/15/16; the patient arrives today with more loss of epithelium from the posterior aspect of his left calf. He now has a fairly substantial wound in this area. The reason behind this deterioration isn't exactly clear although his edema is not well controlled. He states he feels he is generally more swollen systemically. He is not complaining of chest pain shortness  of breath fever. T me he has an appointment with his primary physician in early February. He is on 80 mg of oral ells Lasix a day. He claims compliance with the external compression pumps. He is not having any pain in his legs similar to what he has with his recurrent cellulitis 11/22/16; the patient arrives a follow-up of his large area on his left lateral calf. This looks somewhat better today. He came in earlier in the week for a dressing change since I saw him a week ago. He is not complaining of any pain no shortness of breath no chest pain 11/28/16; the patient arrives for follow-up of his large area on the left lateral calf this does not look better. In fact it is larger weeping edema. The surface of the wound does not look too bad. We have been using silver alginate although I'm not certain that this is a dressing issue. 12/05/16; again the patient follows up for a large wound on the left lateral and left posterior calf this does not look better. There continues to be weeping edema necrotic surface tissue. More worrisome than this once again there is erythema below the wound involving the distal Achilles and heel suggestive of cellulitis. He is on his feet working most of the day of this is not going well. We are changing his dressing twice a week to facilitate the drainage. 12/12/16; not much change in the  overall dimensions of the large area on the left posterior calf. This is very inflamed looking. I gave him an. Doxycycline last week does not really seem to have helped. He found the wrap very painful indeed it seems to of dog into his legs superiorly and perhaps around the heel. He came in early today because the drainage had soaked through his dressings. 12/19/16- patient arrives for follow-up evaluation of his left lower extremity ulcers. He states that he is using his lymphedema pumps once daily when there is "no drainage". He admits to not using his lipedema pumps while under current treatment. His blood sugars have been consistently between 150-200. 12/26/16; the patient is not using his compression pumps at home because of the wetness on his feet. I've advised him that I think it's important for him to use this daily. He finds his feet too wet, he can put a plastic bag over his legs while he is in the pumps. Otherwise I think will be in a vicious circle. We are using silver alginate to the major area on his left posterior calf 01/02/17; the patient's posterior left leg has further of all into 3 open wounds. All of them covered with a necrotic surface. He claims to be using his compression pumps once a day. His edema control is marginal. Continue with silver alginate 01/10/17; the patient's left posterior leg actually looks somewhat better. There is less edema, less erythema. Still has 3 open areas covered with a necrotic surface requiring debridement. He claims to be using his compression pumps once a day his edema control is better 01/17/17; the patient's left posterior calf look better last week when I saw him and his wrap was changed 2 days ago. He has noted increasing pain in the left heel and arrives today with much larger wounds extensive erythema extending down into the entire heel area especially tender medially. He is not systemically unwell CBGs have been controlled no fever. Our intake nurse  showed me limegreen drainage on his AVD pads. 01/24/17; his usual this patient responds nicely  to antibiotics last week giving him Levaquin for presumed Pseudomonas. The whole entire posterior part of his leg is much better much less inflamed and in the case of his Achilles heel area much less tender. He has also had some epithelialization posteriorly there are still open areas here and still draining but overall considerably better 01/31/17- He has continue to tolerate the compression wraps. he states that he continues to use the lymphedema pumps daily, and can increase to twice daily on the weekends. He is voicing no complaints or concerns regarding his LLE ulcers 02/07/17-he is here for follow-up evaluation. He states that he noted some erythema to the left medial and anterior thigh, which he states is new as of yesterday. He is concerned about recurrent cellulitis. He states his blood sugars have been slightly elevated, this morning in the 180s 02/14/17; he is here for follow-up evaluation. When he was last here there was erythema superiorly from his posterior wound in his anterior thigh. He was prescribed Levaquin however a culture of the wound surface grew MRSA over the phone I changed him to doxycycline on Monday and things seem to be a lot better. 02/24/17; patient missed his appointment on Friday therefore we changed his nurse visit into a physician visit today. Still using silver alginate on the large area of the posterior left thigh. He isn't new area on the dorsal left second toe 03/03/17; actually better today although he admits he has not used his external compression pumps in the last 2 days or so because of work responsibilities over the weekend. 03/10/17; continued improvement. External compression pumps once a day almost all of his wounds have closed on the posterior left calf. Better edema control 03/17/17; in general improved. He still has 3 small open areas on the lateral aspect of his  left leg however most of the area on the posterior part of his leg is epithelialized. He has better edema control. He has an ABD pad under his stocking on the right anterior lower leg although he did not let us look at that today. 03/24/17; patient arrives back in clinic today with no open areas however there are areas on the posterior left calf and anterior left calf that are less than 100% epithelialized. His edema is well controlled in the left lower leg. There is some pitting edema probably lymphedema in the left upper thigh. He uses compression pumps at home once per day. I tried to get him to do this twice a day although he is very reticent. 04/01/2017 -- for the last 2 days he's had significant redness, tenderness and weeping and came in for an urgent visit today. 04/07/17; patient still has 6 more days of doxycycline. He was seen by Dr. Meyer Russel last Wednesday for cellulitis involving the posterior aspect, lateral aspect of his Involving his heel. For the most part he is better there is less erythema and less weeping. He has been on his feet for 12 hours o2 over the weekend. Using his compression pumps once a day 04/14/17 arrives today with continued improvement. Only one area on the posterior left calf that is not fully epithelialized. He has intense bilateral venous inflammation associated with his chronic venous insufficiency disease and secondary lymphedema. We have been using silver alginate to the left posterior calf wound In passing he tells [place] today that the right leg but we have not seen in quite some time has an open area on it but he doesn't want [place] to look at this  today states he will show this to [place] next week. 04/21/17; there is no open area on his left leg although he still reports some weeping edema. He showed [place] his right leg today which is the first time we've seen this leg in a long time. He has a large area of open wound on the right leg anteriorly healthy granulation. Quite a bit  of swelling in the right leg and some degree of venous inflammation. He told us about the right leg in passing last week but states that deterioration in the right leg really only happened over the weekend 04/28/17; there is no open area on the left leg although there is an irritated part on the posterior which is like a wrap injury. The wound on the right leg which was new from last week at least to [place] is a lot better. 05/05/17; still no open area on the left leg. Patient is using his new compression stocking which seems to be doing a good job of controlling the edema. He states he is using his compression pumps once per day. The right leg still has an open wound although it is better in terms of surface area. Required debridement. A lot of pain in the posterior right Achilles marked tenderness. Usually this type of presentation this patient gives concern for an active cellulitis 05/12/17; patient arrives today with his major wound from last week on the right lateral leg somewhat better. Still requiring debridement. He was using his compression stocking on the left leg however that is reopened with superficial wounds anteriorly he did not have an open wound on this leg previously. He is still using his juxta light's once daily at night. He cannot find the time to do this in the morning as he has to be at work by 7 AM 05/19/17; right lateral leg wound looks improved. No debridement required. The concerning area is on the left posterior leg which appears to almost have a subcutaneous hemorrhagic component to it. We've been using silver alginate to all the wounds 05/26/17; the right lateral leg wound continues to look improved. However the area on the left posterior calf is a tightly adherent surface. [place] using silver alginate. Because of the weeping edema in his legs there is very little good alternatives. 06/02/17; the patient left here last week looking quite good. Major wound on the left posterior calf  and a small one on the right lateral calf. Both of these look satisfactory. He tells me that by Wednesday he had noted increased pain in the left leg and drainage. He called on Thursday and Friday to get an appointment here but we were blocked. He did not go to urgent care or his primary physician. He thinks he had a fever on Thursday but did not actually take his temperature. He has not been using his compression pumps on the left leg because of pain. I advised him to go to the emergency room today for IV antibiotics for stents of left leg cellulitis but he has refused I have asked him to take 2 days off work to keep his leg elevated and he has refused this as well. In view of this I'm going to call him and Augmentin and doxycycline. He tells me he took some leftover doxycycline starting on Friday previous cultures of the left leg have grown MRSA 06/09/2017 -- the patient has florid cellulitis of his left lower extremity with copious amount of drainage and there is no doubt in my mind  that he needs inpatient care. However after a detailed discussion regarding the risk benefits and alternatives he refuses to get admitted to the hospital. With no other recourse I will continue him on oral antibiotics as before and hopefully he'll have his infectious disease consultation this week. 06/16/2017 -- the patient was seen today by the nurse practitioner at infectious disease Ms. Dixon. 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 He has also been advise Lotrimin cream locally. she also had wise IV antibiotics for his condition if it flares up 06/23/17; patient arrives today with drainage bilaterally although the remaining wound on the left posterior calf after cleaning up today "highlighter yellow drainage" did not look too bad.  Unfortunately he has had breakdown on the right anterior leg [previously this leg had not been open and he is using a black stocking] he went to see infectious disease and is been put on clindamycin 150 mg daily, I did not verify the dose although I'm not familiar with using clindamycin in this dosing range, perhaps for prophylaxisoo 06/27/17; I brought this patient back today to follow-up on the wound deterioration on the right lower leg together with surrounding cellulitis. I started him on doxycycline 4 days ago. This area looks better however he comes in today with intense cellulitis on the medial part of his left thigh. This is not have a wound in this area. Extremely tender. We've been using silver alginate to the wounds on the right lower leg left lower leg with bilateral 4 layer compression he is using his external compression pumps once a day 07/04/17; patient's left medial thigh cellulitis looks better. He has not been using his compression pumps as his insert said it was contraindicated with cellulitis. His right leg continues to make improvements all the wounds are still open. We only have one remaining wound on the left posterior calf. Using silver alginate to all open areas. He is on doxycycline which I started a week ago and should be finishing I gave him Augmentin after Thursday's visit for the severe cellulitis on the left medial thigh which fortunately looks better 07/14/17; the patient's left medial thigh cellulitis has resolved. The cellulitis in his right lower calf on the right also looks better. All of his wounds are stable to improved we've been using silver alginate he has completed the antibiotics I have given him. He has clindamycin 150 mg once a day prescribed by infectious disease for prophylaxis, I've advised him to start this now. We have been using bilateral Unna boots over silver alginate to the wound areas 07/21/17; the patient is been to see infectious disease who noted  his recurrent problems with cellulitis. He was not able to tolerate prophylactic clindamycin therefore he is on amoxicillin 500 twice a day. He also had a second daily dose of Lasix added By Dr. Oneta Rack but he is not taking this. Nor is he being completely compliant with his compression pumps a especially not this week. He has 2 remaining wounds one on the right posterior lateral lower leg and one on the left posterior medial lower leg. 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 The patient has Unna boots bilaterally. Still wounds on his right lateral, left medial, and a new open area  on the left anterior lateral lower leg 08/04/17; he remains on amoxicillin twice a day for prophylaxis of recurrent cellulitis. He has bilateral Unna boots for compression and silver alginate to his wounds. Arrives today with his legs looking as good as I have seen him in quite some time. Not surprisingly his wounds look better as well with improvement on the right lateral leg venous insufficiency wound and also the left medial leg. He is still using the compression pumps once a day 08/11/17; both legs appear to be doing better wounds on the right lateral and left medial legs look better. Skin on the right leg quite good. He is been using silver alginate as the primary dressing. I'm going to use Anasept gel calcium alginate and maintain all the secondary dressings 08/18/17; the patient continues to actually do quite well. The area on his right lateral leg is just about closed the left medial also looks better although it is still moist in this area. His edema is well controlled we have been using Anasept gel with calcium alginate and the usual secondary dressings, 4 layer compression and once daily use of his compression pumps "always been able to manage 09/01/17; the patient continues to do reasonably well in spite of his trip to [place]. The  area on the right lateral leg is epithelialized. Left is much better but still open. He has more edema and more chronic erythema on the left leg [venous inflammation] 09/08/17; he arrives today with no open wound on the right lateral leg and decently controlled edema. Unfortunately his left leg is not nearly as in his good situation as last week.he apparently had increasing edema starting on Saturday. He edema soaked through into his foot so used a plastic bag to walk around his home. The area on the medial right leg which was his open area is about the same however he has lost surface epithelium on the left lateral which is new and he has significant pain in the Achilles area of the left foot. He is already on amoxicillin chronically for prophylaxis of cellulitis in the left leg 09/15/17; he is completed a week of doxycycline and the cellulitis in the left posterior leg and Achilles area is as usual improved. He still has a lot of edema and fluid soaking through his dressings. There is no open wound on the right leg. He saw infectious disease NP today 09/22/17;As usual 1 we transition him from our compression wraps to his stockings things did not go well. He has several small open areas on the right leg. He states this was caused by the compression wrap on his skin although he did not wear this with the stockings over them. He has several superficial areas on the left leg medially laterally posteriorly. He does not have any evidence of active cellulitis especially involving the left Achilles The patient is traveling from [facility] Saturday going to [place]. He states he isn't attempting to get an appointment with a heel objects wound center there to change his dressings. I am not completely certain whether this will work 10/06/17; the patient came in on Friday for a nurse visit and the nurse reported that his legs actually look quite good. He arrives in clinic today for his regular follow-up  visit. He has a new wound on his left third toe over the PIP probably caused by friction with his footwear. He has small areas on the left leg and a very superficial but epithelialized area on the  right anterior lateral lower leg. Other than that his legs look as good as I've seen him in quite some time. We have been using silver alginate Review of systems; no chest pain no shortness of breath other than this a 10 point review of systems negative 10/20/17; seen by Dr. Meyer Russel last week. He had taken some antibiotics [doxycycline] that he had left over. Dr. Meyer Russel thought he had candida infection and declined to give him further antibiotics. He has a small wound remaining on the right lateral leg several areas on the left leg including a larger area on the left posterior several left medial and anterior and a small wound on the left lateral. The area on the left dorsal third toe looks a lot better. ROS; Gen.; no fever, respiratory no cough no sputum Cardiac no chest pain other than this 10 point review of system is negative 10/30/17; patient arrives today having fallen in the bathtub 3 days ago. It took him a while to get up. He has pain and maceration in the wounds on his left leg which have deteriorated. He has not been using his pumps he also has some maceration on the right lateral leg. 11/03/17; patient continues to have weeping edema especially in the left leg. This saturates his dressings which were just put on on 12/27. As usual the doxycycline seems to take care of the cellulitis on his lower leg. He is not complaining of fever, chills, or other systemic symptoms. He states his leg feels a lot better on the doxycycline I gave him empirically. He also apparently gets injections at his primary doctor's officeo Rocephin for cellulitis prophylaxis. I didn't ask him about his compression pump compliance today I think that's probably marginal. Arrives in the clinic with all of his dressings primary  and secondary macerated full of fluid and he has bilateral edema 11/10/17; the patient's right leg looks some better although there is still a cluster of wounds on the right lateral. The left leg is inflamed with almost circumferential skin loss medially to laterally although we are still maintaining anteriorly. He does not have overt cellulitis there is a lot of drainage. He is not using compression pumps. We have been using silver alginate to the wound areas, there are not a lot of options here 11/17/17; the patient's right leg continues to be stable although there is still open wounds, better than last week. The inflammation in the left leg is better. Still loss of surface layer epithelium especially posteriorly. There is no overt cellulitis in the amount of edema and his left leg is really quite good, tells me he is using his compression pumps once a day. 11/24/17; patient's right leg has a small superficial wound laterally this continues to improve. The inflammation in the left leg is still improving however we have continuous surface layer epithelial loss posteriorly. There is no overt cellulitis in the amount of edema in both legs is really quite good. He states he is using his compression pumps on the left leg once a day for 5 out of 7 days 12/01/17; very small superficial areas on the right lateral leg continue to improve. Edema control in both legs is better today. He has continued loss of surface epithelialization and left posterior calf although I think this is better. We have been using silver alginate with large number of absorptive secondary dressings 4 layer on the left Unna boot on the right at his request. He tells me he is using his compression  pumps once a day 12/08/17; he has no open area on the right leg is edema control is good here. ooOn the left leg however he has marked erythema and tenderness breakdown of skin. He has what appears to be a wrap injury just distal to the popliteal  fossa. 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 There was swelling erythema and breakdown in the right leg. He has much worse skin breakdown in the left leg as well multiple open areas medially and posteriorly as well as laterally. He tells me he has been using his compression pumps but tells me he feels that the drainage out of his leg is worse when he uses a compression pumps. T be fair to him he is been saying this o for a while however I don't know that I have really been listening to this. I wonder if the compression pumps are working properly 12/22/17;. Once again he arrives with severe erythema, weeping edema from the left greater than right leg. Noncompliance with compression pumps. New this visit he is complaining of pain on the lateral aspect of the right leg and the medial aspect of his right thigh. He apparently saw his cardiologist Dr. [name] who was ordered an echocardiogram area and I think this is a step in the right direction 12/25/17; started his doxycycline Monday night. There is still intense erythema of the right leg especially in the anterior thigh although there is less tenderness. The erythema around the wound on the right lateral calf also is less tender. He still complaining of pain in the left heel. His wounds are about the same right lateral left medial left lateral. Superficial but certainly not close to closure. He denies being systemically unwell no fever chills no abdominal pain no diarrhea 12/29/17; back in follow-up of his extensive right calf and right thigh cellulitis. I added amoxicillin to cover possible doxycycline resistant strep. This seems to of done the trick he is in much less pain  there is much less erythema and swelling. He has his echocardiogram at 11:00 this morning. X-ray of the left heel was also negative. 01/05/18; the patient arrived with his edema under much better control. Now that he is retired he is able to use his compression pumps daily and sometimes twice a day per the patient. He has a wound on the right leg the lateral wound looks better. Area on the left leg also looks a lot better. He has no evidence of cellulitis in his bilateral thighs I had a quick peak at his echocardiogram. He is in normal ejection fraction and normal left ventricular function. He has moderate pulmonary hypertension moderately reduced right ventricular function. One would have to wonder about chronic sleep apnea although he says he doesn't snore. He'll review the echocardiogram with his cardiologist. 01/12/18; the patient arrives with the edema in both legs under exemplary control. He is using his compression pumps daily and sometimes twice daily. His wound on the right lateral leg is just about closed. He still has some weeping areas on the posterior left calf and lateral left calf although everything is just about closed here as well. I have spoken with Aldean Baker who is the patient's nurse practitioner and infectious disease.  She was concerned that the patient had not understood that the parenteral penicillin injections he was receiving for cellulitis prophylaxis was actually benefiting him. I don't think the patient actually saw that I would tend to agree we were certainly dealing with less infections although he had a serious one last month. 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 He is healed. He'll be placed in juxtalite compression wraps and increase his lymphedema pumps to twice daily. We will follow up again next week to ensure there are no issues with the new regiment. 01/20/18-he is here for evaluation of bilateral lower extremity  weeping edema. Yesterday he was placed in compression wrap to the right lower extremity and compression stocking to left lower shrubbery. He states he uses lymphedema pumps last night and again this morning and noted a blister to the left lower extremity. On exam he was noted to have drainage to the right lower extremity. He will be placed in Unna boots bilaterally and follow-up next week 01/26/18; patient was actually discharged a week ago to his own juxta light stockings only to return the next day with bilateral lower extremity weeping edema.he was placed in bilateral Unna boots. He arrives today with pain in the back of his left leg. There is no open area on the right leg however there is a linear/wrap injury on the left leg and weeping edema on the left leg posteriorly. I spoke with infectious disease about 10 days ago. They were disappointed that the patient elected to discontinue prophylactic intramuscular penicillin shots as they felt it was particularly beneficial in reducing the frequency of his cellulitis. I discussed this with the patient today. He does not share this view. He'll definitely need antibiotics today. Finally he is traveling to [place] and trauma leaving this Saturday and returning a week later and he does not travel with his pumps. He is going by car 01/30/18; patient was seen 4 days ago and brought back in today for review of cellulitis in the left leg posteriorly. I put him on amoxicillin this really hasn't helped as much as I might like. He is also worried because he is traveling to [facility] trauma by car. Finally we will be rewrapping him. There is no open area on the right leg over his left leg has multiple weeping areas as usual 02/09/18; The same wrap on for 10 days. He did not pick up the last doxycycline I prescribed for him. He apparently took 4 days worth he already had. There is nothing open on his right leg and the edema control is really quite good. He's had damage  in the left leg medially and laterally especially probably related to the prolonged use of Unna boots 02/12/18; the patient arrived in clinic today for a nurse visit/wrap change. He complained of a lot of pain in the left posterior calf. He is taking doxycycline that I previously prescribed for him. Unfortunately even though he used his stockings and apparently used to compression pumps twice a day he has weeping edema coming out of the lateral part of his right leg. This is coming from the lower anterior lateral skin area. 02/16/18; the patient has finished his doxycycline and will finish the amoxicillin 2 days. The area of cellulitis in the left calf posteriorly has resolved. He is no longer having any pain. He tells me he is using his compression pumps at least once a day sometimes twice. 02/23/18; the patient finished his doxycycline and Amoxil  last week. On Friday he noticed a small erythematous circle about the size of a quarter on the left lower leg just above his ankle. This rapidly expanded and he now has erythema on the lateral and posterior part of the thigh. This is bright red. Also has an area on the dorsal foot just above his toes and a tender area just below the left popliteal fossa. He came off his prophylactic penicillin injections at his own insistence one or 2 months ago. This is obviously deteriorated since then 03/02/18; patient is on doxycycline and Amoxil. Culture I did last week of the weeping area on the back of his left calf grew group B strep. I have therefore renewed the amoxicillin 500 3 times a day for a further week. He has not been systemically unwell. Still complaining of an area of discomfort right under his left popliteal fossa. There is no open wound on the right leg. He tells me that he is using his pumps twice a day on most days 03/09/18; patient arrives in clinic today completing his amoxicillin today. The cellulitis on his left leg is better. Furthermore he tells me  that he had intramuscular penicillin shots that his primary care office today. However he also states that the wrap on his right leg fell down shortly after leaving clinic last week. He developed a large blister that was present when he came in for a nurse visit later in the week and then he developed intense discomfort around this area.He tells me he is using his compression pumps 03/16/18; the patient has completed his doxycycline. The infectious part of this/cellulitis in the left heel area left popliteal area is a lot better. He has 2 open areas on the right calf. Still areas on the left calf but this is a lot better as well. 03/24/18; the patient arrives complaining of pain in the left popliteal area again. He thinks some of this is wrap injury. He has no open area on the right leg and really no open area on the left calf either except for the popliteal area. He claims to be compliant with the compression pumps 03/31/18; I gave him doxycycline last week because of cellulitis in the left popliteal area. This is a lot better although the surface epithelium is denuded off and response to this. He arrives today with uncontrolled edema in the right calf area as well as a fingernail injury in the right lateral calf. There is only a few open areas on the left 04/06/18; I gave him amoxicillin doxycycline over the last 2 weeks that the amoxicillin should be completing currently. He is not complaining of any pain or systemic symptoms. The only open areas see has is on the right lateral lower leg paradoxically I cannot see anything on the left lower leg. He tells me he is using his compression pumps twice a day on most days. Silver alginate to the wounds that are open under 4 layer compression 04/13/18; he completed antibiotics and has no new complaints. Using his compression pumps. Silver alginate that anything that's opened 04/20/18; he is using his compression pumps religiously. Silver alginate 4 layer  compression anything that's opened. He comes in today with no open wounds on the left leg but 3 on the right including a new one posteriorly. He has 2 on the right lateral and one on the right posterior. He likes Unna boots on the right leg for reasons that aren't really clear we had the usual 4 layer compression  on the left. It may be necessary to move to the 4 layer compression on the right however for now I left them in the Unna boots 04/27/18; he is using his compression pumps at least once a day. He has still the wounds on the right lateral calf. The area right posteriorly has closed. He does not have an open wound on the left under 4 layer compression however on the dorsal left foot just proximal to the toes and the left third toe 2 small open areas were identified 05/11/18; he has not uses compression pumps. The areas on the right lateral calf have coalesced into one large wound necrotic surface. On the left side he has one small wound anteriorly however the edema is now weeping out of a large part of his left leg. He says he wasn't using his pumps because of the weeping fluid. I explained to him that this is the time he needs to pump more 05/18/18; patient states he is using his compression pumps twice a day. The area on the right lateral large wound albeit superficial. On the left side he has innumerable number of small new wounds on the left calf particularly laterally but several anteriorly and medially. All these appear to have healthy granulated base these look like the remnants of blisters however they occurred under compression. The patient arrives in clinic today with his legs somewhat better. There is certainly less edema, less multiple open areas on the left calf and the right anterior leg looks somewhat better as well superficial and a little smaller. However he relates pain and erythema over the last 3-4 days in the thigh and I looked at this today. He has not been systemically unwell  no fever no chills no change in blood sugar values 05/25/18; comes in today in a better state. The severe cellulitis on his left leg seems better with the Keflex. Not as tender. He has not been systemically unwell ooHard to find an open wound on the left lower leg using his compression pumps twice a day ooThe confluent wounds on his right lateral calf somewhat better looking. These will ultimately need debridement I didn't do this today. 06/01/18; the severe cellulitis on the left anterior thigh has resolved and he is completed his Keflex. ooThere is no open wound on the left leg however there is a superficial excoriation at the base of the third toe dorsally. Skin on the bottom of his left foot is macerated looking. ooThe left the wounds on the lateral right leg actually looks some better although he did require debridement of the top half of this wound area with an open curet 06/09/18 on evaluation today patient appears to be doing poorly in regard to his right lower extremity in particular this appears to likely be infected he has very thick purulent discharge along with a bright green tent to the discharge. This makes me concerned about the possibility of pseudomonas. He's also having increased discomfort at this point on evaluation. Fortunately there does not appear to be any evidence of infection spreading to the other location at this time. 06/16/18 on evaluation today patient appears to actually be doing fairly well. His ulcer has actually diminished in size quite significantly at this point which is good news. Nonetheless he still does have some evidence of infection he did see infectious disease this morning before coming here for his appointment. I did review the results of their evaluation and their note today. They did actually have him discontinue  the Cipro and initiate treatment with linezolid at this time. He is doing this for the next seven days and they recommended a follow-up in four  months with them. He is the keep a log of the need for intermittent antibiotic therapy between now and when he falls back up with infectious disease. This will help them gaze what exactly they need to do to try and help them out. 06/23/18; the patient arrives today with no open wounds on the left leg and left third toe healed. He is been using his compression pumps twice a day. On the right lateral leg he still has a sizable wound but this is a lot better than last time I saw this. 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 Has been using silver alginate under 4 layer compression 06/30/18; the only open wound he has is on the right lateral leg and this looks healthy. No debridement is required. We have been using silver alginate. He does not have an open wound on the left leg. There is apparently some drainage from the dorsal proximal third toe on the left although I see no open wound here. 07/03/18 on evaluation today patient was actually here just for a nurse visit rapid change. However when he was here on Wednesday for his rat change due to having been healed on the left and then developing blisters we initiated the wrap again knowing that he would be back today for [place] to reevaluate and see were at. Unfortunately he has developed some cellulitis into the proximal portion of his right lower extremity even into the region of his thigh. 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 He was placed on one as what at that point. Nonetheless he is done with that and has been tolerating it well otherwise. Doxycycline which in the past really did not seem to be effective for him. Nonetheless I think the best option may be for [place] to definitely reinitiate the antibiotics for a longer period of time. 07/07/18; since I last saw this patient a week ago he has had a difficult  time. At that point he did not have an open wound on his left leg. We transitioned him into juxta light stockings. He was apparently in the clinic the next day with blisters on the left lateral and left medial lower calf. He also had weeping edema fluid. He was put back into a compression wrap. He was also in the clinic on Friday with intense erythema in his right thigh. Per the patient he was started on Bactrim however that didn't work at all in terms of relieving his pain and swelling. He has taken 3 doxycycline that he had left over from last time and that seems to of helped. He has blistering on the right thigh as well. 07/14/18; the erythema on his right thigh has gotten better with doxycycline that he is finishing. 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 Presumably this cellulitis in the thigh was not related to that although I think this is a bit concerning going forward. He still has an area on the right lateral calf the blister on the right medial calf just below the knee that was discussed above. On the left 2 small open areas left medial and  left lateral. Edema control is adequate. He is using his compression pumps twice a day 07/20/18; continued improvement in the condition of both legs especially the edema in his bilateral thighs. He tells me he is been losing weight through a combination of diet and exercise. He is using his compression pumps twice a day. So overall she made to the remaining wounds 07/27/2018; continued improvement in condition of both legs. His edema is well controlled. The area on the right lateral leg is just about closed he had one blisters show up on the medial left upper calf. We have him in 4 layer compression. He is going on a 10-day trip to [place], [place] and [place]. He will be driving. He wants to wear Unna boots because of the lessening amount of constriction. He will not use  compression pumps while he is away 08/05/18 on evaluation today patient actually appears to be doing decently well all things considered in regard to his bilateral lower extremities. The worst ulcer is actually only posterior aspect of his left lower extremity with a four layer compression wrap cut into his leg a couple weeks back. He did have a trip and actually had [employer] for the trip that he is worn since he was last here. Nonetheless he feels like the [employer] actually do better for him his swelling is up a little bit but he also with his trip was not taking his Lasix on a regular set schedule like he was supposed to be. He states that obviously the reason being that he cannot drive and keep going without having to urinate too frequently which makes it difficult. He did not have his pumps with him while he was away either which I think also maybe playing a role here too. 08/13/2018; the patient only has a small open wound on the right lateral calf which is a big improvement in the last month or 2. He also has the area posteriorly just below the posterior fossa on the left which I think was a wrap injury from several weeks ago. He has no current evidence of cellulitis. He tells me he is back into his compression pumps twice a day. He also tells me that while he was at the laundromat somebody stole a section of his extremitease stockings 08/20/2018; back in the clinic with a much improved state. He only has small areas on the right lateral mid calf which is just about healed. This was is more substantial area for quite a prolonged period of time. He has a small open area on the left anterior tibia. The area on the posterior calf just below the popliteal fossa is closed today. He is using his compression pumps twice a day 08/28/2018; patient has no open wound on the right leg. He has a smattering of open areas on the calf with some weeping lymphedema. More problematically than that it  looks as though his wraps of slipped down in his usual he has very angry upper area of edema just below the right medial knee and on the right lateral calf. He has no open area on his feet. The patient is traveling to [facility] next week. I will send him in an antibiotic. We will continue to wrap the right leg. We ordered extremitease stockings for him last week and I plan to transition the right leg to a stocking when he gets home which will be in 10 days time. As usual he is very reluctant  to take his pumps with him when he travels 09/07/2018; patient returns from [facility]. He shows me a picture of his left leg in the mid part of his trip last week with intense fire engine erythema. The picture look bad enough I would have considered sending him to the hospital. Instead he went to the wound care center in [facility]. They did not prescribe him antibiotics but he did take some doxycycline he had leftover from a previous visit. I had given him trimethoprim sulfamethoxazole before he left this did not work according to the patient. This is resulted in some improvement fortunately. He comes back with a large wound on the left posterior calf. Smaller area on the left anterior tibia. Denuded blisters on the dorsal left foot over his toes. Does not have much in the way of wounds on the right leg although he does have a very tender area on the right posterior area just below the popliteal fossa also suggestive of infection. He promises me he is back on his pumps twice a day 09/15/2018; the intense cellulitis in his left lower calf is a lot better. The wound area on the posterior left calf is also so better. However he has reasonably extensive wounds on the dorsal aspect of his second and third toes and the proximal foot just at the base of the toes. There is nothing open on the right leg 09/22/2018; the patient has excellent edema control in his legs bilaterally. He is using his external  compression pumps twice a day. He has no open area on the right leg and only the areas in the left foot dorsally second and third toe area on the left side. He does not have any signs of active cellulitis. 10/06/2018; the patient has good edema control bilaterally. He has no open wound on the right leg. There is a blister in the posterior aspect of his left calf that we had to deal with today. He is using his compression pumps twice a day. There is no signs of active cellulitis. We have been using silver alginate to the wound areas. He still has vulnerable areas on the base of his left first second toes dorsally He has a his extremities stockings and we are going to transition him today into the stocking on the right leg. He is cautioned that he will need to continue to use the compression pumps twice a day. If he notices uncontrolled edema in the right leg he may need to go to 3 times a day. 10/13/2018; the patient came in for a nurse check on Friday he has a large flaccid blister on the right medial calf just below the knee. We unroofed this. He has this and a new area underneath the posterior mid calf which was undoubtedly a blister as well. He also has several small areas on the right which is the area we put his extremities stocking on. 10/19/2018; the patient went to see infectious disease this morning I am not sure if that was a routine follow-up in any case the doxycycline I had given him was discontinued and started on linezolid. He has not started this. It is easy to look at his left calf and the inflammation and think this is cellulitis however he is very tender in the tissue just below the popliteal fossa and I have no doubt that there is infection going on here. He states the problem he is having is that with the compression pumps the edema goes  down and then starts walking the wrap falls down. We will see if we can adhere this. He has 1 or 2 minuscule open areas on the right still areas  that are weeping on the posterior left calf, the base of his left second and third toes 10/26/18; back today in clinic with quite of skin breakdown in his left anterior leg. This may have been infection the area below the popliteal fossa seems a lot better however tremendous epithelial loss on the left anterior mid tibia area over quite inexpensive tissue. He has 2 blisters on the right side but no other open wound here. 10/29/2018; came in urgently to see [place] today and we worked him in for review. He states that the 4 layer compression on the right leg caused pain he had to cut it down to roughly his mid calf this caused swelling above the wrap and he has blisters and skin breakdown today. As a result of the pain he has not been using his pumps. Both legs are a lot more edematous and there is a lot of weeping fluid. 11/02/18; arrives in clinic with continued difficulties in the right leg> left. Leg is swollen and painful. multiple skin blisters and new open areas especially laterally. He has not been using his pumps on the right leg. 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 He is not systemically unwell. 11/09/2018; the patient claims he is being compliant with his pumps. He is finished the doxycycline I gave him last week. Culture I did of the wound on the right lateral leg showed a few very resistant methicillin staph aureus. This was resistant to doxycycline. Nevertheless he states the pain in the leg is a lot better which makes me wonder if the cultured organism was not really what was causing the problem nevertheless this is a very dangerous organism to be culturing out of any wound. His right leg is still a lot larger than the left. He is using an Radio broadcast assistant on this area, he blames a 4-layer compression for causing the original skin breakdown which I doubt is true however I cannot talk him out of it. We have been using silver alginate to all of these  areas which were initially blisters 11/16/2018; patient is being compliant with his external compression pumps at twice a day. Miraculously he arrives in clinic today with absolutely no open wounds. He has better edema control on the left where he has been using 4 layer compression versus wound of wounds on the right and I pointed this out to him. There is no inflammation in the skin in his lower legs which is also somewhat unusual for him. There is no open wounds on the dorsal left foot. He has extremitease stockings at home and I have asked him to bring these in next week. 11/25/18 patient's lower extremity on examination today on the left appears for the most part to be wound free. He does have an open wound on the lateral aspect of the right lower extremity but this is minimal compared to what I've seen in past. He does request that we go ahead and wrap the left leg as well even though there's nothing open just so hopefully it will not reopen in short order. 1/28; patient has superficial open wounds on the right lateral calf left anterior calf and left posterior calf. His edema control is adequate. He has an area of very tender erythematous skin at the superior  upper part of his calf compatible with his recurrent cellulitis. We have been using silver alginate as the primary dressing. He claims compliance with his compression pumps 2/4; patient has superficial open wounds on numerous areas of his left calf and again one on the left dorsal foot. The areas on the right lateral calf have healed. The cellulitis that I gave him doxycycline for last week is also resolved this was mostly on the left anterior calf just below the tibial tuberosity. His edema looks fairly well-controlled. He tells me he went to see his primary doctor today and had blood work ordered 2/11; once again he has several open areas on the left calf left tibial area. Most of these are small and appear to have healthy granulation. He  does not have anything open on the right. The edema and control in his thighs is pretty good which is usually a good indication he has been using his pumps as requested. 2/18; he continues to have several small areas on the left calf and left tibial area. Most of these are small healthy granulation. We put him in his stocking on the right leg last week and he arrives with a superficial open area over the right upper tibia and a fairly large area on the right lateral tibia in similar condition. His edema control actually does not look too bad, he claims to be using his compression pumps twice a day 2/25. Continued small areas on the left calf and left tibial area. New areas especially on the right are identified just below the tibial tuberosity and on the right upper tibia itself. There are also areas of weeping edema fluid even without an obvious wound. He does not have a considerable degree of lymphedema but clearly there is more edema here than his skin can handle. He states he is using the pumps twice a day. We have an Unna boot on the right and 4 layer compression on the left. 3/3; he continues to have an area on the right lateral calf and right posterior calf just below the popliteal fossa. There is a fair amount of tenderness around the wound on the popliteal fossa but I did not see any evidence of cellulitis, could just be that the wrap came down and rubbed in this area. ooHe does not have an open area on the left leg however there is an area on the left dorsal foot at the base of the third toe ooWe have been using silver alginate to all wound areas 3/10; he did not have an open area on his left leg last time he was here a week ago. T oday he arrives with a horizontal wound just below the tibial tuberosity and an area on the left lateral calf. He has intense erythema and tenderness in this area. The area is on the right lateral calf and right posterior calf better than last week. We have been  using silver alginate as usual 3/18 - Patient returns with 3 small open areas on left calf, and 1 small open area on right calf, the skin looks ok with no significant erythema, he continues the UNA boot on right and 4 layer compression on left. The right lateral calf wound is closed , the right posterior is small area. we will continue silver alginate to the areas. 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 He is been tolerating the dressing changes without complication. Fortunately she has made excellent improvement in regard to the overall status of his wounds. Unfortunately every time we cease wrapping him he ends up reopening in causing more significant issues at that point. Again I'm unsure of the best direction to take although I think the lymphedema clinic may be appropriate for him. 02/03/19 on evaluation today patient appears to be doing well in regard to the wounds that we saw him for last week unfortunately he has a new area on the proximal portion of his right medial/posterior lower extremity where the wrap somewhat slowed down and caused swelling and a blister to rub and open. Unfortunately this is the only opening that he has on either leg at this point. 02/17/19 on evaluation today patient's bilateral lower extremities appear to be doing well. He still completely healed in regard to the left lower extremity. In regard to the right lower extremity the area where the wrap and slid down and caused the blister still seems to be slightly open although this is dramatically better than during the last evaluation two weeks ago. I'm very pleased with the way this stands overall. 03/03/19 on evaluation today patient appears to be doing well in regard to his right lower extremity in general although he did  have a new blister open this does not appear to be showing any evidence of active infection at this time. Fortunately there's No fevers, chills, nausea, or vomiting noted at this time. Overall I feel like he is making good progress it does feel like that the right leg will we perform the [employer] seems to do with a bit better than three layer wrap on the left which slid down on him. We may switch to doing bilateral in the book wraps. 5/4; I have not seen Mr. Bendorf in quite some time. According to our case manager he did not have an open wound on his left leg last week. He had 1 remaining wound on the right posterior medial calf. He arrives today with multiple openings on the left leg probably were blisters and/or wrap injuries from Unna boots. I do not think the Unna boot's will provide adequate compression on the left. I am also not clear about the frequency he is using the compression pumps. 03/17/19 on evaluation today patient appears to be doing excellent in regard to his lower extremities compared to last week's evaluation apparently. He had gotten significantly worse last week which is unfortunate. The [employer] wrap on the left did not seem to do very well for him at all and in fact it didn't control his swelling significantly enough he had an additional outbreak. Subsequently we go back to the four layer compression wrap on the left. This is good news. At least in that he is doing better and the wound seem to be killing him. He still has not heard anything from the lymphedema clinic. 03/24/19 on evaluation today patient actually appears to be doing much better in regard to his bilateral lower Trinity as compared to last week when I saw him. Fortunately there's no signs of active infection at this time. He has been tolerating the dressing changes without complication. Overall I'm extremely pleased with the progress and appearance in general. 04/07/19 on evaluation today patient appears to be  doing well in regard to his bilateral lower extremities. His swelling is significantly down from where it was previous. With  that being said he does have a couple blisters still open at this point but fortunately nothing that seems to be too severe and again the majority of the larger openings has healed at this time. 04/14/19 on evaluation today patient actually appears to be doing quite well in regard to his bilateral lower extremities in fact I'm not even sure there's anything significantly open at this time at any site. Nonetheless he did have some trouble with these wraps where they are somewhat irritating him secondary to the fact that he has noted that the graph wasn't too close down to the end of this foot in a little bit short as well up to his knee. Otherwise things seem to be doing quite well. 04/21/19 upon evaluation today patient's wound bed actually showed evidence of being completely healed in regard to both lower extremities which is excellent news. There does not appear to be any signs of active infection which is also good news. I'm very pleased in this regard. No fevers, chills, nausea, or vomiting noted at this time. 04/28/19 on evaluation today patient appears to be doing a little bit worse in regard to both lower extremities on the left mainly due to the fact that when he went infection disease the wrap was not wrapped quite high enough he developed a blister above this. On the right he is a small open area of nothing too significant but again this is continuing to give him some trouble he has been were in the Velcro compression that he has at home. 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 He's been tolerating the dressing changes without complication. Fortunately there's no signs of active infection at this time. No fevers, chills, nausea, or vomiting noted at this time. We have been trying to get an appointment with  her lymphedema clinic in [facility] but unfortunately nobody can get them on phone with not been able to even fax information over the patient likewise is not been able to get in touch with them. Overall I'm not sure exactly what's going on here with to reach out again today. 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 Still having a lot of drainage unfortunately. He tells me especially in the left but even on the right. There's no signs of active infection which is good news we've been using so ratcheted up to this point. 05/19/19 on evaluation today patient actually appears to be doing quite well with regard to his left lower extremity which is great news. Fortunately in regard to the right lower extremity has an issues with his wrap and he subsequently did remove this from what I'm understanding. Nonetheless long story short is what he had rewrapped once he removed it subsequently had maggots underneath this wrap whenever he came in for evaluation today. With that being said they were obviously completely cleaned away by the nursing staff. The visit today which is excellent news. However he does appear to potentially have some infection around the right ankle region where the maggots were located as well. He will likely require anabiotic therapy today. 05/26/19 on evaluation today patient actually appears to be doing much better in regard to his bilateral lower extremities. I feel like the infection is under much better control. With that being said there were maggots noted when the wrap was removed yet again today. Again this  could have potentially been left over from previous although at this time there does not appear to be any signs of significant drainage there was obviously on the wrap some drainage as well this contracted gnats or otherwise. Either way I do not see anything that appears to be  doing worse in my pinion and in fact I think his drainage has slowed down quite significantly likely mainly due to the fact to his infection being under better control. 06/02/2019 on evaluation today patient actually appears to be doing well with regard to his bilateral lower extremities there is no signs of active infection at this time which is great news. With that being said he does have several open areas more so on the right than the left but nonetheless these are all significantly better than previously noted. 06/09/2019 on evaluation today patient actually appears to be doing well. His wrap stayed up and he did not cause any problems he had more drainage on the right compared to the left but overall I do not see any major issues at this time which is great news. 06/16/2019 on evaluation today patient appears to be doing excellent with regard to his lower extremities the only area that is open is a new blister that can have opened as of today on the medial ankle on the left. Other than this he really seems to be doing great I see no major issues at this point. 06/23/2019 on evaluation today patient appears to be doing quite well with regard to his bilateral lower extremities. In fact he actually appears to be almost completely healed there is a small area of weeping noted of the right lower extremity just above the ankle. Nonetheless fortunately there is no signs of active infection at this time which is good news. No fevers, chills, nausea, vomiting, or diarrhea. 8/24; the patient arrived for a nurse visit today but complained of very significant pain in the left leg and therefore I was asked to look at this. Noted that he did not have an open area on the left leg last week nevertheless this was wrapped. The patient states that he is not been able to put his compression pumps on the left leg because of the discomfort. He has not been systemically unwell 06/30/2019 on evaluation today patient  unfortunately despite being excellent last week is doing much worse with regard to his left lower extremity today. In fact he had to come in for a nurse on Monday where his left leg had to be rewrapped due to excessive weeping Dr. Leanord Hawking placed him on doxycycline at that point. Fortunately there is no signs of active infection Systemically at this time which is good news. 07/07/2019 in regard to the patient's wounds today he actually seems to be doing well with his right lower extremity there really is nothing open or draining at this point this is great news. Unfortunately the left lower extremity is given him additional trouble at this time. There does not appear to be any signs of active infection nonetheless he does have a lot of edema and swelling noted at this point as well as blistering all of which has led to a much more poor appearing leg at this time compared to where it was 2 weeks ago when it was almost completely healed. Obviously this is a little discouraging for the patient. He is try to contact the lymphedema clinic in [place] he has not been able to get through to them. 07/14/2019 on evaluation  today patient actually appears to be doing slightly better with regard to his left lower extremity ulcers. Overall I do feel like at least at the top of the wrap that we have been placing this area has healed quite nicely and looks much better. The remainder of the leg is showing signs of improvement. Unfortunately in the thigh area he still has an open region on the left and again on the right he has been utilizing just a Band-Aid on an area that also opened on the thigh. Again this is an area that were not able to wrap although we did do an Ace wrap to provide some compression that something that obviously is a little less effective than the compression wraps we have been using on the lower portion of the leg. He does have an appointment with the lymphedema clinic in [facility] on  Friday. 07/21/2019 on evaluation today patient appears to be doing better with regard to his lower extremity ulcers. He has been tolerating the dressing changes without complication. Fortunately there is no signs of active infection at this time. No fevers, chills, nausea, vomiting, or diarrhea. I did receive the paperwork from the physical therapist at the lymphedema clinic in [place]. Subsequently I signed off on that this morning and sent that back to him for further progression with the treatment plan. 07/28/2019 on evaluation today patient appears to be doing very well with regard to his right lower extremity where I do not see any open wounds at this point. Fortunately he is feeling great as far as that is concerned as well. In regard to the left lower extremity he has been having issues with still several areas of weeping and edema although the upper leg is doing better his lower leg still I think is going require the compression wrap at this time. No fevers, chills, nausea, vomiting, or diarrhea. 08/04/2019 on evaluation today patient unfortunately is having new wounds on the right lower extremity. Again we have been using Unna boot wrap on that side. We switched him to using his juxta lite wrap at home. With that being said he tells me he has been using it although his legs extremely swollen and to be honest really does not appear that he has been. I cannot know that for sure however. Nonetheless he has multiple new wounds on the right lower extremity at this time. Obviously we will have to see about getting this rewrapped for him today. 08/11/2019 on evaluation today patient appears to be doing fairly well with regard to his wounds. He has been tolerating the dressing changes including the compression wraps without complication. He still has a lot of edema in his upper thigh regions bilaterally he is supposed to be seeing the lymphedema clinic on the 15th of this month once his wraps  arrive for the upper part of his legs. 08/18/2019 on evaluation today patient appears to be doing well with regard to his bilateral lower extremities at this point. He has been tolerating the dressing changes without complication. Fortunately there is no signs of active infection which is also good news. He does have a couple weeping areas on the first and second toe of the right foot he also has just a small area on the left foot upper leg and a small area on the left lower leg but overall he is doing quite well in my opinion. He is supposed to be getting his wraps shortly in fact tomorrow and then subsequently is seeing the lymphedema  clinic next Wednesday on the 21st. Of note he is also leaving on the 25th to go on vacation for a week to the beach. For that reason and since there is some uncertainty about what there can be doing at lymphedema clinic next Wednesday I am get a make an appointment for next Friday here for [place] to see what we need to do for him prior to him leaving for vacation. 10/23; patient arrives in considerable pain predominantly in the upper posterior calf just distal to the popliteal fossa also in the wound anteriorly above the major wound. This is probably cellulitis and he has had this recurrently in the past. He has no open wound on the right side and he has had an Radio broadcast assistant in that area. Finally I note that he has an area on the left posterior calf which by enlarge is mostly epithelialized. This protrudes beyond the borders of the surrounding skin in the setting of dry scaly skin and lymphedema. The patient is leaving for [facility] on Sunday. Per his longstanding pattern, he will not take his compression pumps with him predominantly out of fear that they will be stolen. He therefore asked that we put a Unna boot back on the right leg. He will also contact the wound care center in [facility] to see if they can change his dressing in the mid week. 11/3; patient returned  from his vacation to [facility] He was seen on 1 occasion at their wound care center. They did a 2 layer compression system as they did not have our 4-layer wrap. I am not completely certain what they put on the wounds. They did not change the Unna boot on the right. The patient is also seeing a lymphedema specialist physical therapist in [place]. It appears that he has some compression sleeve for his thighs which indeed look quite a bit better than I am used to seeing. He pumps over these with his external compression pumps. 11/10; the patient has a new wound on the right medial thigh otherwise there is no open areas on the right. He has an area on the left leg posteriorly anteriorly and medially and an area over the left second toe. We have been using silver alginate. He thinks the injury on his thigh is secondary to friction from the compression sleeve he has. 11/17; the patient has a new wound on the right medial thigh last week. He thinks this is because he did not have a underlying stocking for his thigh juxta lite apparatus. He now has this. The area is fairly large and somewhat angry but I do not think he has underlying cellulitis. ooHe has a intact blister on the right anterior tibial area. ooSmall wound on the right great toe dorsally ooSmall area on the medial left calf. 11/30; the patient does not have any open areas on his right leg and we did not take his juxta lite stocking off. However he states that on Friday his compression wrap fell down lodging around his upper mid calf area. As usual this creates a lot of problems for him. He called urgently today to be seen for a nurse visit however the nurse visit turned into a provider visit because of extreme erythema and pain in the left anterior tibia extending laterally and posteriorly. The area that is problematic is extensive 10/06/2019 upon evaluation today patient actually appears to be doing poorly in regard to his left  lower extremity. He Dr. Leanord Hawking did place him  on doxycycline this past Monday apparently due to the fact that he was doing much worse in regard to this left leg. Fortunately the doxycycline does seem to be helping. Unfortunately we are still having a very difficult time getting his edema under any type of control in order to anticipate discharge at some point. The only way were really able to control his lymphedema really is with compression wraps and that has only even seemingly temporary. He has been seeing a lymphedema clinic they are trying to help in this regard but still this has been somewhat frustrating in general for the patient. 10/13/19 on evaluation today patient appears to be doing excellent with regard to his right lower extremity as far as the wounds are concerned. His swelling is still quite extensive unfortunately. He is still having a lot of drainage from the thigh areas bilaterally which is unfortunate. He's been going to lymphedema clinic but again he still really does not have this edema under control as far as his lower extremities are concern. With regard to his left lower extremity this seems to be improving and I do believe the doxycycline has been of benefit for him. He is about to complete the doxycycline. 10/20/2019 on evaluation today patient appears to be doing poorly in regard to his bilateral lower extremities. More in the right thigh he has a lot of irritation at this site unfortunately. In regard to the left lower extremity the wrap was not quite as high it appears and does seem to have caused him some trouble as well. Fortunately there is no evidence of systemic infection though he does have some blue-green drainage which has me concerned for the possibility of Pseudomonas. He tells me he is previously taking Cipro without complications and he really does not care for Levaquin however due to some of the side effects he has. He is not allergic to any medications  specifically antibiotics that were aware of. 10/27/2019 on evaluation today patient actually does appear to be for the most part doing better when compared to last week's evaluation. With that being said he still has multiple open wounds over the bilateral lower extremities. He actually forgot to start taking the Cipro and states that he still has the whole bottle. He does have several new blisters on left lower extremity today I think I would recommend he go ahead and take the Cipro based on what I am seeing at this point. 12/30-Patient comes at 1 week visit, 4 layer compression wraps on the left and Unna boot on the right, primary dressing Xtrasorb and silver alginate. Patient is taking his Cipro and has a few more days left probably 5-6, and the legs are doing better. He states he is using his compressions devices which I believe he has 11/10/2019 on evaluation today patient actually appears to be much better than last time I saw him 2 weeks ago. His wounds are significantly improved and overall I am very pleased in this regard. Fortunately there is no signs of active infection at this time. He is just a couple of days away from completing Cipro. Overall his edema is much better he has been using his lymphedema pumps which I think is also helping at this point. 11/17/2019 on evaluation today patient appears to be doing excellent in regard to his wounds in general. His legs are swollen but not nearly as much as they have been in the past. Fortunately he is tolerating the compression wraps without complication. No fevers, chills,  nausea, vomiting, or diarrhea. He does have some erythema however in the distal portion of his right lower extremity specifically around the forefoot and toes there is a little bit of warmth here as well. 11/24/2019 on evaluation today patient appears to be doing well with regard to his right lower extremity I really do not see any open wounds at this point. His left lower  extremity does have several open areas and his right medial thigh also is open. Other than this however overall the patient seems to be making good progress and I am very pleased at this point. 12/01/2019 on evaluation today patient appears to be doing poorly at this point in regard to his left lower extremity has several new blisters despite the fact that we have him in compression wraps. In fact he had a 4-layer compression wrap, his upper thigh wrapped from lymphedema clinic, and a juxta light over top of the 4 layer compression wrap the lymphedema clinic applied and despite all this he still develop blisters underneath. Obviously this does have me concerned about the fact that unfortunately despite what we are doing to try to get wounds healed he continues to have new areas arise I do not think he is ever good to be at the point where he can realistically just use wraps at home to keep things under control. Typically when we heal him it takes about 1-2 days before he is back in the clinic with severe breakdown and blistering of his lower extremities bilaterally. This is happened numerous times in the past. Unfortunately I think that we may need some help as far as overall fluid overload to kind of limit what we are seeing and get things under better control. 12/08/2019 on evaluation today patient presents for follow-up concerning his ongoing bilateral lower extremity edema. Unfortunately he is still having quite a bit of swelling the compression wraps are controlling this to some degree but he did see Dr. [name] his cardiologist I do have that available for review today as far as the appointment was concerned that was on 12/06/2019. Obviously that she has been 2 days ago. The patient states that he is only been taking the Lasix 80 mg 1 time a day he had told me previously he was taking this twice a day. Nonetheless Dr. [name] recommended this be up to 80 mg 2 times a day for the patient as he did appear to  be fluid overloaded. With that being said the patient states he did this yesterday and he was unable to go anywhere or do anything due to the fact that he was constantly having to urinate. Nonetheless I think that this is still good to be something that is important for him as far as trying to get his edema under control at all things that he is going to be able to just expect his wounds to get under control and things to be better without going through at least a period of time where he is trying to stabilize his fluid management in general and I think increasing the Lasix is likely the first step here. It was also mentioned the possibility that the patient may require metolazone. With that being said he wanted to have the patient take Lasix twice a day first and then reevaluating 2 months to see where things stand. 12/15/2019 upon evaluation today patient appears to be doing regard to his legs although his toes are showing some signs of weeping especially on the left at this  point to some degree on the right. There does not appear to be any signs of active infection and overall I do feel like the compression wraps are doing well for him but he has not been able to take the Lasix at home and the increased dose that Dr. [name] recommended. He tells me that just not go to be feasible for him. Nonetheless I think in this case he should probably send a message to Dr. [name] in order to discuss options from the standpoint of possible admission to get the fluid off or otherwise going forward. 12/22/2019 upon evaluation today patient appears to be doing fairly well with regard to his lower extremities at this point. In fact he would be doing excellent if it was not for the fact that his right anterior thigh apparently had an allergic reaction to adhesive tape that he used. The wound itself that we have been monitoring actually appears to be healed. There is a lot of irritation at this point. 12/29/2019 upon  evaluation today patient appears to be doing well in regard to his lower extremities. His left medial thigh is open and somewhat draining today but this is the only region that is open the right has done much better with the treatment utilizing the steroid cream that I prescribed for him last week. Overall I am pleased in that regard. Fortunately there is no signs of active infection at this time. No fevers, chills, nausea, vomiting, or diarrhea. 01/05/2020 upon evaluation today patient appears to be doing more poorly in regard to his right lower extremity at this point upon evaluation today. Unfortunately he continues to have issues in this regard and I think the biggest issue is controlling his edema. This obviously is not very well controlled at this point is been recommended that he use the Lasix twice a day but he has not been able to do that. Unfortunately I think this is leading to an issue where honestly he is not really able to effectively control his edema and therefore the wounds really are not doing significantly better. I do not think that he is going to be able to keep things under good control unless he is able to control his edema much better. I discussed this again in great detail with him today. 01/12/2020 good news is patient actually appears to be doing quite well today at this point. He does have an appointment with lymphedema clinic tomorrow. His legs appear healed and the toe on the left is almost completely healed. In general I am very pleased with how things stand at this point. 01/19/2020 upon evaluation today patient appears to actually be doing well in regard to his lower extremities there is nothing open at this point. Fortunately he has done extremely well more recently. Has been seeing lymphedema clinic as well. With that being said he has Velcro wraps for his lower legs as well as his upper legs. The only wound really is on his toe which is the right great toe and this is  barely anything even there. With all that being said I think it is good to be appropriate today to go ahead and switch him over to the Velcro compression wraps. 01/26/2020 upon evaluation today patient appears to be doing worse with regard to his lower extremities after last week switch him to Velcro compression wraps. Unfortunately he lasted less than 24 hours he did not have the sock portion of his Velcro wrap on the left leg and subsequently developed  a blister underneath the Velcro portion. Obviously this is not good and not what we were looking for at this point. He states the lymphedema clinic did tell him to wear the wrap for 23 hours and take him off for 1 I am okay with that plan but again right now we got a get things back under control again he may have some cellulitis noted as well. 02/02/2020 upon evaluation today patient unfortunately appears to have several areas of blistering on his bilateral lower extremities today mainly on the feet. His legs do seem to be doing somewhat better which is good news. Fortunately there is no evidence of active infection at this time. No fevers, chills, nausea, vomiting, or diarrhea. 02/16/2020 upon evaluation today patient appears to be doing well at this time with regard to his legs. He has a couple weeping areas on his toes but for the most part everything is doing better and does appear to be sealed up on his legs which is excellent news. We can continue with wrapping him at this point as he had every time we discontinue the wraps he just breaks out with new wounds. There is really no point in is going forward with this at this point. 03/08/2020 upon evaluation today patient actually appears to be doing quite well with regard to his lower extremity ulcers. He has just a very superficial and really almost nonexistent blister on the left lower extremity he has in general done very well with the compression wraps. With that being said I do not see any signs  of infection at this time which is good news. 03/29/2020 upon evaluation today patient appears to be doing well with regard to his wounds currently except for where he had several new areas that opened up due to some of the wrap slipping and causing him trouble. He states he did not realize they had slipped. Nonetheless he has a 1 area on the right and 3 new areas on the left. Fortunately there is no signs of active infection at this time which is great news. 04/05/2020 upon evaluation today patient actually appears to be doing quite well in general in regard to his legs currently. Fortunately there is no signs of active infection at this time. No fevers, chills, nausea, vomiting, or diarrhea. He tells me next week that he will actually be seen in the lymphedema clinic on Thursday at 10 AM I see him on Wednesday next week. 04/12/2020 upon evaluation today patient appears to be doing very well with regard to his lower extremities bilaterally. In fact he does not appear to have any open wounds at this point which is good news. Fortunately there is no signs of active infection at this time. No fevers, chills, nausea, vomiting, or diarrhea. 04/19/2020 upon evaluation today patient appears to be doing well with regard to his wounds currently on the bilateral lower extremities. There does not appear to be any signs of active infection at this time. Fortunately there is no evidence of systemic infection and overall very pleased at this point. Nonetheless after I held him out last week he literally had blisters the next morning already which swelled up with him being right back here in the clinic. Overall I think that he is just not can be able to be discharged with his legs the way they are he is much to volume overloaded as far as fluid is concerned and that was discussed with him today of also discussed this but should try  the clinic nurse manager as well as Dr. Leanord Hawking. 04/26/2020 upon evaluation today patient  appears to be doing better with regard to his wounds currently. He is making some progress and overall swelling is under good control with the compression wraps. Fortunately there is no evidence of active infection at this time. 05/10/2020 on evaluation today patient appears to be doing overall well in regard to his lower extremities bilaterally. He is Tolerating the compression wraps without complication and with what we are seeing currently I feel like that he is making excellent progress. There is no signs of active infection at this time. 05/24/2020 upon evaluation today patient appears to be doing well in regard to his legs. The swelling is actually quite a bit down compared to where it has been in the past. Fortunately there is no sign of active infection at this time which is also good news. With that being said he does have several wounds on his toes that have opened up at this point. 05/31/2020 upon evaluation today patient appears to be doing well with regard to his legs bilaterally where he really has no significant fluid buildup at this point overall he seems to be doing quite well. Very pleased in this regard. With regard to his toes these also seem to be drying up which is excellent. We have continue to wrap him as every time we tried as a transition to the juxta light wraps things just do not seem to get any better. 06/07/2020 upon evaluation today patient appears to be doing well with regard to his right leg at this point. Unfortunately left leg has a lot of blistering he tells me the wrap started to slide down on him when he tried to put his other Velcro wrap over top of it to help keep things in order but nonetheless still had some issues. 06/14/2020 on evaluation today patient appears to be doing well with regard to his lower extremity ulcers and foot ulcers at this point. I feel like everything is actually showing signs of improvement which is great news overall there is no signs of active  infection at this time. No fevers, chills, nausea, vomiting, or diarrhea. 06/21/2020 on evaluation today patient actually appears to be doing okay in regard to his wounds in general. With that being said the biggest issue I see is on his right foot in particular the first and second toe seem to be doing a little worse due to the fact this is staying very wet. I think he is probably getting need to change out his dressings a couple times in between each week when we see him in regard to his toes in order to keep this drier based on the location and how this is proceeding. 06/28/2020 on evaluation today patient appears to be doing a little bit more poorly overall in regard to the appearance of the skin I am actually somewhat concerned about the possibility of him having a little bit of an infection here. We discussed the course of potentially giving him a doxycycline prescription which he is taken previously with good result. With that being said I do believe that this is potentially mild and at this point easily fixed. I just do not want anything to get any worse. 07/12/2020 upon evaluation today patient actually appears to be making some progress with regard to his legs which is great news there does not appear to be any evidence of active infection. Overall very pleased with where things stand. 07/26/2020  upon evaluation today patient appears to be doing well with regard to his leg ulcers and toe ulcers at this point. He has been tolerating the compression wraps without complication overall very pleased in this regard. 08/09/2020 upon evaluation today patient appears to be doing well with regard to his lower extremities bilaterally. Fortunately there is no signs of active infection overall I am pleased with where things stand. 08/23/2020 on evaluation today patient appears to be doing well with regard to his wound. He has been tolerating the dressing changes without complication. Fortunately there is no  signs of active infection at this time. Overall his legs seem to be doing quite well which is great news and I am very pleased in that regard. No fevers, chills, nausea, vomiting, or diarrhea. 09/13/2020 upon evaluation today patient appears to be doing okay in regard to his lower extremities. He does have a fairly large blister on the right leg which I did remove the blister tissue from today so we can get this to dry out other than that however he seems to be doing quite well. There is no signs of active infection at this time. 09/27/2020 upon evaluation today patient appears to actually be doing some better in regard to his right leg. Fortunately signs of active infection at this time which is great news. No fevers, chills, nausea, vomiting, or diarrhea. 10/04/2020 upon evaluation today patient actually appears to be showing signs of improvement which is great news with regard to his leg ulcers. Fortunately there is no signs of active infection which is great news he is still taking the antibiotics currently. No fevers, chills, nausea, vomiting, or diarrhea. 10/18/2020 on evaluation today patient appears to be doing well with regard to his legs currently. He has been tolerating the dressing changes including the wraps without complication. Fortunately there is no signs of active infection at this time. No fevers, chills, nausea, vomiting, or diarrhea. 10/25/2020 upon evaluation today patient appears to be doing decently well in regard to his wounds currently. He has been tolerating the dressing changes without complication. Overall I feel like he is making good progress albeit slow. Again this is something we can have to continue to wrap for some time to come most likely. 11/08/2020 upon evaluation today patient appears to be doing well with regard to his wounds currently. He has been tolerating the dressing changes without complication is not currently on any antibiotics and he does not appear to  show any signs of infection. He does continue to have a lot of drainage on the right leg not too severe but nonetheless this is very scattered. On the left leg this is looking to be much improved overall. 11/15/2020 upon evaluation today patient appears to be doing better with regard to his legs bilaterally. Especially the right leg which was much more significant last week. There does not appear to be any signs of active infection which is great news. No fevers, chills, nausea, vomiting, or diarrhea. 11/23/2019 upon evaluation today patient appears to be doing poorly still in regard to his lower extremities bilaterally. Unfortunately his right leg in particular appears to be doing much more poorly there is no signs really of infection this is not warm to touch but he does have a lot of drainage and weeping unfortunately. With that reason I do believe that we may need to initiate some treatment here to try to help calm down some of the swelling of the right leg. I think switching to a  4-layer compression wrap would be beneficial here. The patient is in agreement with giving this a try. 11/29/2020 upon evaluation today patient appears to be doing well currently in regard to his leg ulcers. I feel like the right leg is doing better he still has a lot of drainage but we do see some improvement here. The 4-layer compression wrap I think was helpful. 12/06/2020 upon evaluation today patient appears to be doing well with regard to his legs. In fact they seem to be doing about the best I have seen up to this point. Fortunately there is no signs of active infection at this time. No fevers, chills, nausea, vomiting, or diarrhea. 12/20/2020 upon evaluation today patient appears to be doing well at this time in regard to his legs. He is not having any significant draining which is great news. Fortunately there is no signs of active infection at this time. No fevers, chills, nausea, vomiting, or diarrhea. 01/17/2021 upon  evaluation today Jaaziel actually appears to be doing excellent in regard to his legs. He has a few areas again that come and go as far as his toes are concerned but overall this is doing quite well. 01/31/2021 upon evaluation today patient appears to be doing well with regard to his legs. Fortunately there does not appear to be any signs of active infection which is great news. Overall he is still having significant edema despite the compression wraps basically the 4-layer compression wrap to just keep things under control there is really not much room for play. 4/13: Mr. Nolt is a longstanding patient in our clinic and benefits greatly from weekly compression wraps. Today he has no complaints. He has been tolerating the wraps well. He states he is using the lymphedema pumps at home. Patient History Information obtained from Patient. Family History Cancer - SiblingsX2, Diabetes - Mother, No family history of Heart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

## 2021-02-15 ENCOUNTER — Telehealth: Payer: Self-pay | Admitting: Family Medicine

## 2021-02-15 ENCOUNTER — Ambulatory Visit: Payer: Self-pay | Admitting: *Deleted

## 2021-02-15 NOTE — Telephone Encounter (Signed)
   Telephone encounter was:  Unsuccessful.  02/15/2021 Name: [name] MRN: [id number] DOB: October 23, 1951  Unsuccessful outbound call made today to assist with:  Food Insecurity and in home care  Outreach Attempt:  1st Attempt  A HIPAA compliant voice message was left requesting a return call.  Instructed patient to call back at [phone].  [name] Care Guide, Embedded Care Coordination [facility], Care Management Phone: [phone] Email: [name]

## 2021-02-21 ENCOUNTER — Encounter (HOSPITAL_BASED_OUTPATIENT_CLINIC_OR_DEPARTMENT_OTHER): Payer: Medicare Other | Admitting: Physician Assistant

## 2021-02-21 ENCOUNTER — Ambulatory Visit: Payer: Self-pay | Admitting: *Deleted

## 2021-02-21 ENCOUNTER — Other Ambulatory Visit: Payer: Self-pay

## 2021-02-21 DIAGNOSIS — I890 Lymphedema, not elsewhere classified: Secondary | ICD-10-CM | POA: Diagnosis not present

## 2021-02-21 DIAGNOSIS — I87333 Chronic venous hypertension (idiopathic) with ulcer and inflammation of bilateral lower extremity: Secondary | ICD-10-CM | POA: Diagnosis not present

## 2021-02-21 DIAGNOSIS — Z794 Long term (current) use of insulin: Secondary | ICD-10-CM | POA: Diagnosis not present

## 2021-02-21 DIAGNOSIS — E1140 Type 2 diabetes mellitus with diabetic neuropathy, unspecified: Secondary | ICD-10-CM | POA: Diagnosis not present

## 2021-02-21 DIAGNOSIS — L97521 Non-pressure chronic ulcer of other 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 of skin: Secondary | ICD-10-CM | POA: Diagnosis not present

## 2021-02-21 DIAGNOSIS — E11621 Type 2 diabetes mellitus with foot ulcer: Secondary | ICD-10-CM | POA: Diagnosis not present

## 2021-02-21 DIAGNOSIS — E1142 Type 2 diabetes mellitus with diabetic polyneuropathy: Secondary | ICD-10-CM | POA: Diagnosis not present

## 2021-02-21 DIAGNOSIS — I10 Essential (primary) hypertension: Secondary | ICD-10-CM | POA: Diagnosis not present

## 2021-02-21 NOTE — Progress Notes (Signed)
[name], [name] ([id number]) Visit Report for 02/21/2021 Arrival Information Details Patient Name: Date of Service: [name], [name] 02/21/2021 10:30 A M Medical Record Number: [id number] Patient Account Number: [id number] Date of Birth/Sex: Treating RN: 07-Jun-1951 (70 y.o. [name] Primary Care [name]: [name] Other Clinician: Referring [name]: Treating Lorece [name]/Extender: [name] in Treatment: 265 Visit Information History Since Last Visit Added or deleted any medications: No Patient Arrived: Wheel Chair Any new allergies or adverse reactions: No Arrival Time: 10:55 Had a fall or experienced change in No Accompanied By: self activities of daily living that may affect Transfer Assistance: None risk of falls: Patient Identification Verified: Yes Signs or symptoms of abuse/neglect since last visito No Secondary Verification Process Completed: Yes Hospitalized since last visit: No Patient Requires Transmission-Based Precautions: No Implantable device outside of the clinic excluding No Patient Has Alerts: Yes cellular tissue based products placed in the [name] since last visit: Has Dressing in Place as Prescribed: Yes Pain Present Now: No Electronic Signature(s) Signed: 02/21/2021 4:38:38 PM By: [name] Entered By: [name] on 02/21/2021 10:57:01 -------------------------------------------------------------------------------- Compression Therapy Details Patient Name: Date of Service: [name], [name] 02/21/2021 10:30 A M Medical Record Number: [id number] Patient Account Number: [id number] Date of Birth/Sex: Treating RN: 22-Mar-1951 (70 y.o. [name] Primary Care Rian Busche: [name] Other Clinician: Referring [name]: Treating [name]/Extender: [name] in Treatment: 265 Compression Therapy Performed for Wound Assessment: Wound #194 Left,Lateral Lower Leg Performed By:  Clinician Kevin Stall, RN Compression Type: Four Layer Electronic Signature(s) Signed: 02/21/2021 5:55:25 PM By: [name] Entered By: [name] on 02/21/2021 11:53:39 -------------------------------------------------------------------------------- Compression Therapy Details Patient Name: Date of Service: [name], [name] 02/21/2021 10:30 A M Medical Record Number: [id number] Patient Account Number: [id number] Date of Birth/Sex: Treating RN: 1951/10/16 (70 y.o. [name] Primary Care Mehar Sagen: Other Clinician: Nicoletta [name] Referring Neviah Braud: Treating [name]/Extender: [name] in Treatment: 265 Compression Therapy Performed for Wound Assessment: NonWound Condition Lymphedema - Right Leg Performed By: Clinician Kevin Stall, RN Compression Type: Four Layer Electronic Signature(s) Signed: 02/21/2021 5:55:25 PM By: [name] Entered By: [name] on 02/21/2021 11:53:56 -------------------------------------------------------------------------------- Encounter Discharge Information Details Patient Name: Date of Service: [name], [name] 02/21/2021 10:30 A M Medical Record Number: [id number] Patient Account Number: [id number] Date of Birth/Sex: Treating RN: Mar 25, 1951 (70 y.o. [name] Primary Care Aleksis Jiggetts: [name] Other Clinician: Referring [name]: Treating [name]/Extender: [name] in Treatment: [phone] Encounter Discharge Information Items Discharge Condition: Stable Ambulatory Status: Walker Discharge Destination: Home Transportation: Private Auto Accompanied By: self Schedule Follow-up Appointment: Yes Clinical Summary of Care: Electronic Signature(s) Signed: 02/21/2021 5:55:25 PM By: [name] Entered By: [name] on 02/21/2021 11:55:23 -------------------------------------------------------------------------------- Patient/Caregiver Education  Details Patient Name: Date of Service: [name], [name] 4/20/2022andnbsp10:30 A M Medical Record Number: [id number] Patient Account Number: [id number] Date of Birth/Gender: Treating RN: 07/17/1951 (70 y.o. [name] Primary Care Physician: [name] Other Clinician: Referring Physician: Treating Physician/Extender: [name] in Treatment: [phone] Education Assessment Education Provided To: Patient Education Topics Provided Venous: Handouts: Controlling Swelling with Multilayered Compression Wraps Methods: Explain/Verbal Responses: Reinforcements needed Electronic Signature(s) Signed: 02/21/2021 5:55:25 PM By: [name] Entered By: [name] on 02/21/2021 11:55:10 -------------------------------------------------------------------------------- Wound Assessment Details Patient Name: Date of Service: [name], [name] 02/21/2021 10:30 A M Medical Record Number: [id number] Patient Account Number: [id number] Date of  Birth/Sex: Treating RN: 11/19/1950 (70 y.o. [name] Primary Care Bretton Tandy: [name] Other Clinician: Referring Josey Forcier: Treating Janai Maudlin/Extender: [name] in Treatment: 265 Wound Status Wound Number: 193 Primary Etiology: Diabetic Wound/Ulcer of the Lower Extremity Wound Location: Left T Second oe Wound Status: Open Wounding Event: Gradually Appeared Date Acquired: 01/31/2021 Weeks Of Treatment: 3 Clustered Wound: No Wound Measurements Length: (cm) 3 Width: (cm) 2 Depth: (cm) 0.1 Area: (cm) 4.712 Volume: (cm) 0.471 % Reduction in Area: -77.5% % Reduction in Volume: -77.7% Wound Description Classification: Grade 1 Treatment Notes Wound #193 (Toe Second) Wound Laterality: Left Cleanser Peri-Wound Care Topical Primary Dressing KerraCel Ag Gelling Fiber Dressing, 2x2 in (silver alginate) Discharge Instruction: Apply silver alginate to wound bed as  instructed Secondary Dressing Secured With Conforming Stretch Gauze Bandage, Sterile 2x75 (in/in) Discharge Instruction: Secure with stretch gauze as directed. Compression Wrap Compression Stockings Add-Ons Notes BLE lotion, unna boot first layer to upper portion of lower legs, 4 layer compression wraps applied. Electronic Signature(s) Signed: 02/21/2021 4:38:38 PM By: [name] Signed: 02/21/2021 6:20:01 PM By: Zenaida Deed RN, BSN Entered By: [name] on 02/21/2021 10:57:53 -------------------------------------------------------------------------------- Wound Assessment Details Patient Name: Date of Service: [name], [name] 02/21/2021 10:30 A M Medical Record Number: [id number] Patient Account Number: [id number] Date of Birth/Sex: Treating RN: 27-May-1951 (70 y.o. [name] Primary Care Aleasha Fregeau: [name] Other Clinician: Referring Amun Stemm: Treating Michaelina Blandino/Extender: [name] in Treatment: 265 Wound Status Wound Number: 194 Primary Etiology: Lymphedema Wound Location: Left, Lateral Lower Leg Wound Status: Open Wounding Event: Gradually Appeared Date Acquired: 02/14/2021 Weeks Of Treatment: 1 Clustered Wound: Yes Wound Measurements Length: (cm) 3 Width: (cm) 2 Depth: (cm) 0.1 Area: (cm) 4.712 Volume: (cm) 0.471 % Reduction in Area: 0% % Reduction in Volume: 0% Wound Description Classification: Full Thickness Without Exposed Support Structur es Treatment Notes Wound #194 (Lower Leg) Wound Laterality: Left, Lateral Cleanser Peri-Wound Care Topical Primary Dressing Secondary Dressing Secured With Compression Wrap Compression Stockings Add-Ons Notes BLE lotion, unna boot first layer to upper portion of lower legs, 4 layer compression wraps applied. Electronic Signature(s) Signed: 02/21/2021 4:38:38 PM By: [name] Signed: 02/21/2021 6:20:01 PM By: Zenaida Deed RN, BSN Entered By:  [name] on 02/21/2021 10:57:53 -------------------------------------------------------------------------------- Wound Assessment Details Patient Name: Date of Service: [name], [name] 02/21/2021 10:30 A M Medical Record Number: [id number] Patient Account Number: [id number] Date of Birth/Sex: Treating RN: [birth year] (70 y.o. [name] Primary Care Aidee Latimore: [name] Other Clinician: Referring Jalaiyah Throgmorton: Treating Bradely Rudin/Extender: [name] in Treatment: 265 Wound Status Wound Number: 195 Primary Etiology: Skin Tear Wound Location: Left, Anterior Lower Leg Wound Status: Open Wounding Event: Skin Tear/Laceration Date Acquired: 02/14/2021 Weeks Of Treatment: 1 Clustered Wound: No Wound Measurements Length: (cm) 1.5 Width: (cm) 0.5 Depth: (cm) 0.1 Area: (cm) 0.589 Volume: (cm) 0.059 % Reduction in Area: 0% % Reduction in Volume: 0% Wound Description Classification: Full Thickness Without Exposed Support Structur es Treatment Notes Wound #195 (Lower Leg) Wound Laterality: Left, Anterior Cleanser Peri-Wound Care Topical Primary Dressing Secondary Dressing Secured With Compression Wrap Compression Stockings Add-Ons Notes BLE lotion, unna boot first layer to upper portion of lower legs, 4 layer compression wraps applied. Electronic Signature(s) Signed: 02/21/2021 4:38:38 PM By: [name] Signed: 02/21/2021 6:20:01 PM By: Zenaida Deed RN, BSN Entered By: [name] on 02/21/2021 10:57:53 -------------------------------------------------------------------------------- Vitals Details Patient Name: Date of Service: [name], [name] 02/21/2021 10:30 A  M Medical Record Number: [id number] Patient Account Number: [id number] Date of Birth/Sex: Treating RN: 22-Feb-1951 (70 y.o. [name] Primary Care Altan Kraai: [name] Other Clinician: Referring Nasier Thumm: Treating Massiel Stipp/Extender: [name] in Treatment: 265 Vital Signs Time Taken: 10:57 Temperature (F): 97.3 Height (in): 70 Pulse (bpm): 68 Weight (lbs): 380.2 Respiratory Rate (breaths/min): 20 Body Mass Index (BMI): 54.5 Blood Pressure (mmHg): 171/65 Capillary Blood Glucose (mg/dl): 403 Reference Range: 80 - 120 mg / dl Electronic Signature(s) Signed: 02/21/2021 4:38:38 PM By: [name] Entered By: [name] on 02/21/2021 10:57:30

## 2021-02-21 NOTE — Progress Notes (Signed)
ARCHIT, LEGER ([id number]) Visit Report for 02/21/2021 SuperBill Details Patient Name: Date of Service: [name], [name] 02/21/2021 Medical Record Number: [id number] Patient Account Number: [id number] Date of Birth/Sex: Treating RN: 1951/07/06 (70 y.o. 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 Electronic Signature(s) Signed: 02/21/2021 4:53:03 PM By: Lenda Kelp PA-C Signed: 02/21/2021 5:55:25 PM By: Shawn Stall Entered By: Shawn Stall on 02/21/2021 11:55:32

## 2021-02-25 ENCOUNTER — Encounter: Payer: Self-pay | Admitting: Family Medicine

## 2021-02-26 ENCOUNTER — Telehealth: Payer: Self-pay | Admitting: Family Medicine

## 2021-02-26 DIAGNOSIS — Z1211 Encounter for screening for malignant neoplasm of colon: Secondary | ICD-10-CM | POA: Diagnosis not present

## 2021-02-26 DIAGNOSIS — Z1212 Encounter for screening for malignant neoplasm of rectum: Secondary | ICD-10-CM | POA: Diagnosis not present

## 2021-02-26 MED ORDER — HYDROCODONE-ACETAMINOPHEN 5-325 MG PO TABS: ORAL_TABLET | ORAL | 0 refills | Status: DC

## 2021-02-26 NOTE — Telephone Encounter (Signed)
Requesting: [employer] Contract: 04/12/20 UDS: n/a Last Visit: 01/03/21 Next Visit: 04/04/21 Last Refill: 12/28/20(60,0)  Please Advise. Medication pending

## 2021-02-26 NOTE — Telephone Encounter (Signed)
Left detailed message, okay per DPR and mychart message sent advising as well.

## 2021-02-26 NOTE — Telephone Encounter (Signed)
   Telephone encounter was:  Unsuccessful.  02/26/2021 Name: [name] MRN: [id number] DOB: Oct 30, 1951  Unsuccessful outbound call made today to assist with:  Food Insecurity  Outreach Attempt:  2nd Attempt  A HIPAA compliant voice message was left requesting a return call.  Instructed patient to call back at [phone]. I did speak to a man named [name] that answered the phone and took my name and number and said that he will give the message to [place] when he gets back.   [name] Care Guide, Embedded Care Coordination [facility], Care Management Phone: [phone] Email: [email or web address]

## 2021-02-27 LAB — COLOGUARD: Cologuard: POSITIVE — AB

## 2021-02-28 ENCOUNTER — Encounter (HOSPITAL_BASED_OUTPATIENT_CLINIC_OR_DEPARTMENT_OTHER): Payer: Medicare Other | Admitting: Internal Medicine

## 2021-02-28 ENCOUNTER — Ambulatory Visit (INDEPENDENT_AMBULATORY_CARE_PROVIDER_SITE_OTHER): Payer: Medicare Other

## 2021-02-28 ENCOUNTER — Other Ambulatory Visit: Payer: Self-pay

## 2021-02-28 DIAGNOSIS — I87333 Chronic venous hypertension (idiopathic) with ulcer and inflammation of bilateral lower extremity: Secondary | ICD-10-CM | POA: Diagnosis not present

## 2021-02-28 DIAGNOSIS — L97521 Non-pressure chronic ulcer of other 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 of skin: Secondary | ICD-10-CM | POA: Diagnosis not present

## 2021-02-28 DIAGNOSIS — I890 Lymphedema, not elsewhere classified: Secondary | ICD-10-CM | POA: Diagnosis not present

## 2021-02-28 DIAGNOSIS — L03119 Cellulitis of unspecified part of limb: Secondary | ICD-10-CM | POA: Diagnosis not present

## 2021-02-28 DIAGNOSIS — E1140 Type 2 diabetes mellitus with diabetic neuropathy, unspecified: Secondary | ICD-10-CM | POA: Diagnosis not present

## 2021-02-28 DIAGNOSIS — E11621 Type 2 diabetes mellitus with foot ulcer: Secondary | ICD-10-CM | POA: Diagnosis not present

## 2021-02-28 DIAGNOSIS — I872 Venous insufficiency (chronic) (peripheral): Secondary | ICD-10-CM

## 2021-02-28 NOTE — Progress Notes (Signed)
Kevin, [name] ([id number]) Visit Report for 02/28/2021 Arrival Information Details Patient Name: Date of Service: [name], [name] 02/28/2021 10:30 A M Medical Record Number: [id number] Patient Account Number: [id number] Date of Birth/Sex: Treating RN: 04/24/1951 (70 y.o. [name] Primary Care Nakiesha Rumsey: Nicoletta Ba Other Clinician: Referring Kelsie Kramp: Treating Jenavi Beedle/Extender: Debe Coder in Treatment: 266 Visit Information History Since Last Visit Added or deleted any medications: No Patient Arrived: [name] Any new allergies or adverse reactions: No Arrival Time: 10:33 Had a fall or experienced change in No Accompanied By: self activities of daily living that may affect Transfer Assistance: None risk of falls: Patient Identification Verified: Yes Signs or symptoms of abuse/neglect since last visito No Secondary Verification Process Completed: Yes Hospitalized since last visit: No Patient Requires Transmission-Based Precautions: No Implantable device outside of the clinic excluding No Patient Has Alerts: Yes cellular tissue based products placed in the [name] since last visit: Has Dressing in Place as Prescribed: Yes Pain Present Now: Yes Electronic Signature(s) Signed: 02/28/2021 10:50:15 AM By: Karl Ito Entered By: Karl Ito on 02/28/2021 10:33:29 -------------------------------------------------------------------------------- Compression Therapy Details Patient Name: Date of Service: [name], [name] 02/28/2021 10:30 A M Medical Record Number: [id number] Patient Account Number: [id number] Date of Birth/Sex: Treating RN: 1951-07-24 (70 y.o. [name] Primary Care Shandel Busic: Nicoletta Ba Other Clinician: Referring Tyrez Berrios: Treating Nagee Goates/Extender: Debe Coder in Treatment: 266 Compression Therapy Performed for Wound Assessment: Wound #194 Left,Lateral Lower Leg Performed By:  Clinician Shawn Stall, RN Compression Type: Four Layer Electronic Signature(s) Signed: 02/28/2021 5:19:23 PM By: Shawn Stall Entered By: Shawn Stall on 02/28/2021 12:42:46 -------------------------------------------------------------------------------- Compression Therapy Details Patient Name: Date of Service: [name], [name] 02/28/2021 10:30 A M Medical Record Number: [id number] Patient Account Number: [id number] Date of Birth/Sex: Treating RN: Mar 09, 1951 (70 y.o. [name] Primary Care Alyssah Algeo: Other Clinician: Nicoletta Ba Referring Darcy Cordner: Treating Elvan Ebron/Extender: Debe Coder in Treatment: 266 Compression Therapy Performed for Wound Assessment: NonWound Condition Lymphedema - Right Leg Performed By: Clinician Shawn Stall, RN Compression Type: Four Layer Electronic Signature(s) Signed: 02/28/2021 5:19:23 PM By: Shawn Stall Entered By: Shawn Stall on 02/28/2021 12:43:02 -------------------------------------------------------------------------------- Encounter Discharge Information Details Patient Name: Date of Service: [name], [name] 02/28/2021 10:30 A M Medical Record Number: [id number] Patient Account Number: [id number] Date of Birth/Sex: Treating RN: 12-11-1950 (70 y.o. [name] Primary Care Casson Catena: Nicoletta Ba Other Clinician: Referring Sharlee Rufino: Treating Ariatna Jester/Extender: Debe Coder in Treatment: [phone] Encounter Discharge Information Items Discharge Condition: Stable Ambulatory Status: Walker Discharge Destination: Home Transportation: Private Auto Accompanied By: self Schedule Follow-up Appointment: Yes Clinical Summary of Care: Electronic Signature(s) Signed: 02/28/2021 5:19:23 PM By: Shawn Stall Entered By: Shawn Stall on 02/28/2021 12:44:39 -------------------------------------------------------------------------------- Patient/Caregiver Education  Details Patient Name: Date of Service: [name], [name] 4/27/2022andnbsp10:30 A M Medical Record Number: [id number] Patient Account Number: [id number] Date of Birth/Gender: Treating RN: 05-27-1951 (70 y.o. [name] Primary Care Physician: Nicoletta Ba Other Clinician: Referring Physician: Treating Physician/Extender: Debe Coder in Treatment: [phone] Education Assessment Education Provided To: Patient Education Topics Provided Venous: Handouts: Managing Venous Disease and Related Ulcers Methods: Explain/Verbal Responses: Reinforcements needed Electronic Signature(s) Signed: 02/28/2021 5:19:23 PM By: Shawn Stall Entered By: Shawn Stall on 02/28/2021 12:44:27 -------------------------------------------------------------------------------- Wound Assessment Details Patient Name: Date of Service: [name], [name] 02/28/2021 10:30 A M Medical Record Number: [id number] Patient Account Number: [id number] Date of Birth/Sex: Treating RN: 1951-04-19 (69 y.o.  [name] Primary Care Charlean Carneal: Nicoletta Ba Other Clinician: Referring Kannon Baum: Treating Ithzel Fedorchak/Extender: Debe Coder in Treatment: 266 Wound Status Wound Number: 193 Primary Etiology: Diabetic Wound/Ulcer of the Lower Extremity Wound Location: Left T Second oe Wound Status: Open Wounding Event: Gradually Appeared Date Acquired: 01/31/2021 Weeks Of Treatment: 4 Clustered Wound: No Wound Measurements Length: (cm) 3 Width: (cm) 2 Depth: (cm) 0.1 Area: (cm) 4.712 Volume: (cm) 0.471 % Reduction in Area: -77.5% % Reduction in Volume: -77.7% Wound Description Classification: Grade 1 Treatment Notes Wound #193 (Toe Second) Wound Laterality: Left Cleanser Peri-Wound Care Topical Primary Dressing Secondary Dressing Secured With Compression Wrap Compression Stockings Add-Ons Notes bilateral lower legs lotion, zinc, calicum alginate Ag,  zetvuit, ABD pads, unna first layer applied to upper portion of lower legs, and 4 layer compression wraps. Electronic Signature(s) Signed: 02/28/2021 10:50:15 AM By: Karl Ito Signed: 02/28/2021 5:51:39 PM By: Zenaida Deed RN, BSN Entered By: Karl Ito on 02/28/2021 10:34:57 -------------------------------------------------------------------------------- Wound Assessment Details Patient Name: Date of Service: Kevin [name], Lynell J. 02/28/2021 10:30 A M Medical Record Number: [id number] Patient Account Number: [id number] Date of Birth/Sex: Treating RN: May 29, 1951 (70 y.o. [name] Primary Care Hana Trippett: Nicoletta Ba Other Clinician: Referring Chijioke Lasser: Treating Tomicka Lover/Extender: Debe Coder in Treatment: 266 Wound Status Wound Number: 194 Primary Etiology: Lymphedema Wound Location: Left, Lateral Lower Leg Wound Status: Open Wounding Event: Gradually Appeared Date Acquired: 02/14/2021 Weeks Of Treatment: 2 Clustered Wound: Yes Wound Measurements Length: (cm) 3 Width: (cm) 2 Depth: (cm) 0.1 Area: (cm) 4.712 Volume: (cm) 0.471 % Reduction in Area: 0% % Reduction in Volume: 0% Wound Description Classification: Full Thickness Without Exposed Support Structu res Treatment Notes Wound #194 (Lower Leg) Wound Laterality: Left, Lateral Cleanser Peri-Wound Care Topical Primary Dressing Secondary Dressing Secured With Compression Wrap Compression Stockings Add-Ons Notes bilateral lower legs lotion, zinc, calicum alginate Ag, zetvuit, ABD pads, unna first layer applied to upper portion of lower legs, and 4 layer compression wraps. Electronic Signature(s) Signed: 02/28/2021 10:50:15 AM By: Karl Ito Signed: 02/28/2021 5:51:39 PM By: Zenaida Deed RN, BSN Entered By: Karl Ito on 02/28/2021 10:34:57 -------------------------------------------------------------------------------- Wound Assessment Details Patient  Name: Date of Service: Kevin [name], Shaheed J. 02/28/2021 10:30 A M Medical Record Number: [id number] Patient Account Number: [id number] Date of Birth/Sex: Treating RN: [birth year] (70 y.o. [name] Primary Care Zenda Herskowitz: Nicoletta Ba Other Clinician: Referring Madelein Mahadeo: Treating Linetta Regner/Extender: Debe Coder in Treatment: 266 Wound Status Wound Number: 195 Primary Etiology: Skin Tear Wound Location: Left, Anterior Lower Leg Wound Status: Open Wounding Event: Skin Tear/Laceration Date Acquired: 02/14/2021 Weeks Of Treatment: 2 Clustered Wound: No Wound Measurements Length: (cm) 1.5 Width: (cm) 0.5 Depth: (cm) 0.1 Area: (cm) 0.589 Volume: (cm) 0.059 % Reduction in Area: 0% % Reduction in Volume: 0% Wound Description Classification: Full Thickness Without Exposed Support Structu res Treatment Notes Wound #195 (Lower Leg) Wound Laterality: Left, Anterior Cleanser Peri-Wound Care Topical Primary Dressing Secondary Dressing Secured With Compression Wrap Compression Stockings Add-Ons Notes bilateral lower legs lotion, zinc, calicum alginate Ag, zetvuit, ABD pads, unna first layer applied to upper portion of lower legs, and 4 layer compression wraps. Electronic Signature(s) Signed: 02/28/2021 10:50:15 AM By: Karl Ito Signed: 02/28/2021 5:51:39 PM By: Zenaida Deed RN, BSN Entered By: Karl Ito on 02/28/2021 10:34:57 -------------------------------------------------------------------------------- Vitals Details Patient Name: Date of Service: Kevin [name], Daniel J. 02/28/2021 10:30 A M Medical Record Number: [id number] Patient Account Number: [id number] Date of Birth/Sex: Treating RN: 1951-08-24 (69  y.o. [name] Primary Care Devanshi Califf: Nicoletta Ba Other Clinician: Referring Avishai Reihl: Treating Georgia Delsignore/Extender: Debe Coder in Treatment: 266 Vital Signs Time Taken: 10:33 Temperature (F):  97.8 Height (in): 70 Pulse (bpm): 77 Weight (lbs): 380.2 Respiratory Rate (breaths/min): 20 Body Mass Index (BMI): 54.5 Blood Pressure (mmHg): 160/65 Capillary Blood Glucose (mg/dl): 267 Reference Range: 80 - 120 mg / dl Electronic Signature(s) Signed: 02/28/2021 10:50:15 AM By: Karl Ito Entered By: Karl Ito on 02/28/2021 10:34:37

## 2021-02-28 NOTE — Progress Notes (Signed)
Kevin Schuenemann Cowperis a 70 y.o.malepresents to the office today for monthly bicillininjections, per physician's orders.  Bicillin 600,000 Unitswas administeredbilaterally in Right and Left upper outter quadtoday. Patient tolerated injection.

## 2021-03-02 ENCOUNTER — Telehealth: Payer: Self-pay

## 2021-03-02 ENCOUNTER — Encounter: Payer: Self-pay | Admitting: Family Medicine

## 2021-03-02 DIAGNOSIS — R195 Other fecal abnormalities: Secondary | ICD-10-CM

## 2021-03-02 NOTE — Telephone Encounter (Signed)
Received results from [facility] with a positive result. Results abstracted and placed on PCP desk.  GI referral pending

## 2021-03-07 ENCOUNTER — Ambulatory Visit: Payer: Medicare Other | Admitting: Internal Medicine

## 2021-03-07 ENCOUNTER — Encounter (HOSPITAL_BASED_OUTPATIENT_CLINIC_OR_DEPARTMENT_OTHER): Payer: Medicare Other | Attending: Physician Assistant | Admitting: Internal Medicine

## 2021-03-07 ENCOUNTER — Other Ambulatory Visit: Payer: Self-pay

## 2021-03-07 DIAGNOSIS — E1140 Type 2 diabetes mellitus with diabetic neuropathy, unspecified: Secondary | ICD-10-CM | POA: Diagnosis not present

## 2021-03-07 DIAGNOSIS — L97822 Non-pressure chronic ulcer of other part of left lower leg with fat layer exposed: Secondary | ICD-10-CM | POA: Insufficient documentation

## 2021-03-07 DIAGNOSIS — L97522 Non-pressure chronic ulcer of other part of left foot with fat layer exposed: Secondary | ICD-10-CM | POA: Diagnosis not present

## 2021-03-07 DIAGNOSIS — E11621 Type 2 diabetes mellitus with foot ulcer: Secondary | ICD-10-CM | POA: Insufficient documentation

## 2021-03-07 DIAGNOSIS — I890 Lymphedema, not elsewhere classified: Secondary | ICD-10-CM | POA: Diagnosis not present

## 2021-03-07 DIAGNOSIS — L97521 Non-pressure chronic ulcer of other 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 of skin: Secondary | ICD-10-CM

## 2021-03-07 DIAGNOSIS — I87333 Chronic venous hypertension (idiopathic) with ulcer and inflammation of bilateral lower extremity: Secondary | ICD-10-CM | POA: Diagnosis not present

## 2021-03-07 DIAGNOSIS — E11622 Type 2 diabetes mellitus with other skin ulcer: Secondary | ICD-10-CM | POA: Insufficient documentation

## 2021-03-07 NOTE — Progress Notes (Signed)
TRI, CHITTICK ([id number]) Visit Report for 03/07/2021 Chief Complaint Document Details Patient Name: Date of Service: CO GAELEN, BRAGER 03/07/2021 10:30 A M Medical Record Number: [id number] Patient Account Number: [id number] Date of Birth/Sex: Treating RN: 01-30-1951 (70 y.o. [name] Primary Care Provider: Nicoletta Ba Other Clinician: Referring Provider: Treating Provider/Extender: Debe Coder in Treatment: 267 Information Obtained from: Patient Chief Complaint patient is here for evaluation of venous/lymphedema ulcers Electronic Signature(s) Signed: 03/07/2021 1:03:06 PM By: Geralyn Corwin DO Entered By: Geralyn Corwin on 03/07/2021 12:50:47 -------------------------------------------------------------------------------- HPI Details Patient Name: Date of Service: [name], Halim J. 03/07/2021 10:30 A M Medical Record Number: [id number] Patient Account Number: [id number] Date of Birth/Sex: Treating RN: 02-13-1951 (70 y.o. [name] Primary Care Provider: Nicoletta Ba Other Clinician: Referring Provider: Treating Provider/Extender: Debe Coder in Treatment: 267 History of Present Illness HPI Description: Referred by PCP for consultation. Patient has long standing history of BLE venous stasis, no prior ulcerations. At beginning of month, developed cellulitis and weeping. Received IM Rocephin followed by Keflex and resolved. Wears compression stocking, appr 6 months old. Not sure strength. No present drainage. 01/22/16 this is a patient who is a type II diabetic on insulin. 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 He tells me he religiously wears pressure stockings of uncertain strength. He was here with weeping edema about 8 months ago but did not have an open wound. Roughly a month ago he had a reopening on his bilateral legs. He is been using bandages and Neosporin. He does  not complain of pain. 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 Last BUN/creatinine I have is from 11/20/15 at 13 and 1.0 respectively 01/29/16; patient arrives today having tolerated the Profore wrap. He brought in his stockings and these are 18 mmHg stockings he bought from [place]. The compression here is likely inadequate. He does not complain of pain or excessive drainage she has no systemic symptoms. The wound on the right looks improved as does the one on the left although one on the left is more substantial with still tissue at risk below the actual wound area on the bilateral posterior calf 02/05/16; patient arrives with poor edema control. He states that we did put a 4 layer compression on it last week. No weight appear 5 this. 02/12/16; the area on the posterior right Has healed. The left Has a substantial wound that has necrotic surface eschar that requires a debridement with a curette. 02/16/16;the patient called or a Nurse visit secondary to increased swelling. He had been in earlier in the week with his right leg healed. He was transitioned to is on pressure stocking on the right leg with the only open wound on the left, a substantial area on the left posterior calf. Note he has a history of severe lower extremity edema, he has a history of chronic atrial fibrillation but not heart failure per my notes but I'll need to research this. He is not complaining of chest pain shortness of breath or orthopnea. The intake nurse noted blisters on the previously closed right leg 02/19/16; this is the patient's regular visit day. I see him on Friday with escalating edema new wounds on the right leg and clear signs of at least right ventricular heart failure. I increased his Lasix to 40 twice a day. He is returning  currently in follow-up. States he is noticed a decrease in that the edema 02/26/16  patient's legs have much less edema. There is nothing really open on the right leg. The left leg has improved condition of the large superficial wound on the posterior left leg 03/04/16; edema control is very much better. The patient's right leg wounds have healed. On the left leg he continues to have severe venous inflammation on the posterior aspect of the left leg. There is no tenderness and I don't think any of this is cellulitis. 03/11/16; patient's right leg is married healed and he is in his own stocking. The patient's left leg has deteriorated somewhat. There is a lot of erythema around the wound on the posterior left leg. There is also a significant rim of erythema posteriorly just above where the wrap would've ended there is a new wound in this location and a lot of tenderness. Can't rule out cellulitis in this area. 03/15/16; patient's right leg remains healed and he is in his own stocking. The patient's left leg is much better than last review. His major wound on the posterior aspect of his left Is almost fully epithelialized. He has 3 small injuries from the wraps. Really. Erythema seems a lot better on antibiotics 03/18/16; right leg remains healed and he is in his own stocking. The patient's left leg is much better. The area on the posterior aspect of the left calf is fully epithelialized. His 3 small injuries which were wrap injuries on the left are improved only one seems still open his erythema has resolved 03/25/16; patient's right leg remains healed and he is in his own stocking. There is no open area today on the left leg posterior leg is completely closed up. His wrap injuries at the superior aspect of his leg are also resolved. He looks as though he has some irritation on the dorsal ankle but this is fully epithelialized without evidence of infection. 03/28/16; we discharged this patient on Monday. Transitioned him into his own stocking. There were problems almost immediately with  uncontrolled swelling weeping edema multiple some of which have opened. He does not feel systemically unwell in particular no chest pain no shortness of breath and he does not feel 04/08/16; the edema is under better control with the Profore light wrap but he still has pitting edema. There is one large wound anteriorly 2 on the medial aspect of his left leg and 3 small areas on the superior posterior calf. Drainage is not excessive he is tolerating a Profore light well 04/15/16; put a Profore wrap on him last week. This is controlled is edema however he had a lot of pain on his left anterior foot most of his wounds are healed 04/22/16 once again the patient has denuded areas on the left anterior foot which he states are because his wrap slips up word. He saw his primary physician today is on Lasix 40 twice a day and states that he his weight is down 20 pounds over the last 3 months. 04/29/16: Much improved. left anterior foot much improved. He is now on Lasix 80 mg per day. Much improved edema control 05/06/16; I was hoping to be able to discharge him today however once again he has blisters at a low level of where the compression was placed last week mostly on his left lateral but also his left medial leg and a small area on the anterior part of the left foot. 05/09/16; apparently the patient went home after his  appointment on 7/4 later in the evening developing pain in his upper medial thigh together with subjective fever and chills although his temperature was not taken. The pain was so intense he felt he would probably have to call 911. However he then remembered that he had leftover doxycycline from a previous round of antibiotics and took these. By the next morning he felt a lot better. He called and spoke to one of our nurses and I approved doxycycline over the phone thinking that this was in relation to the wounds we had previously seen although they were definitely were not. The patient feels a lot  better old fever no chills he is still working. Blood sugars are reasonably controlled 05/13/16; patient is back in for review of his cellulitis on his anterior medial upper thigh. He is taking doxycycline this is a lot better. Culture I did of the nodular area on the dorsal aspect of his foot grew MRSA this also looks a lot better. 05/20/16; the patient is cellulitis on the medial upper thigh has resolved. All of his wound areas including the left anterior foot, areas on the medial aspect of the left calf and the lateral aspect of the calf at all resolved. He has a new blister on the left dorsal foot at the level of the fourth toe this was excised. No evidence of infection 05/27/16; patient continues to complain weeping edema. He has new blisterlike wounds on the left anterior lateral and posterior lateral calf at the top of his wrap levels. The area on his left anterior foot appears better. He is not complaining of fever, pain or pruritus in his feet. 05/30/16; the patient's blisters on his left anterior leg posterior calf all look improved. He did not increase the Lasix 100 mg as I suggested because he was going to run out of his 40 mg tablets. He is still having weeping edema of his toes 06/03/16; I renewed his Lasix at 80 mg once a day as he was about to run out when I last saw him. He is on 80 mg of Lasix now. I have asked him to cut down on the excessive amount of water he was drinking and asked him to drink according to his thirst mechanisms 06/12/2016 -- was seen 2 days ago and was supposed to wear his compression stockings at home but he is developed lymphedema and superficial blisters on the left lower extremity and hence came in for a review 06/24/16; the remaining wound is on his left anterior leg. He still has edema coming from between his toes. There is lymphedema here however his edema is generally better than when I last saw this. He has a history of atrial fibrillation but does not have a  known history of congestive heart failure nevertheless I think he probably has this at least on a diastolic basis. 07/01/16 I reviewed his echocardiogram from January 2017. This was essentially normal. He did not have LVH, EF of 55-60%. His right ventricular function was normal although he did have trivial tricuspid and pulmonic regurgitation. This is not audible on exam however. I increased his Lasix to do massive edema in his legs well above his knees I think in early July. He was also drinking an excessive amount of water at the time. 07/15/16; missed his appointment last week because of the Labor Day holiday on Monday. He could not get another appointment later in the week. Started to feel the wrap digging in superiorly so we remove the  top half and the bottom half of his wrap. He has extensive erythema and blistering superiorly in the left leg. Very tender. Very swollen. Edema in his foot with leaking edema fluid. He has not been systemically unwell 07/22/16; the area on the left leg laterally required some debridement. The medial wounds look more stable. His wrap injury wounds appear to have healed. Edema and his foot is better, weeping edema is also better. He tells me he is meeting with the supplier of the external compression pumps at work 08/05/16; the patient was on vacation last week in [facility]. His wrap is been on for an extended period of time. Also over the weekend he developed an extensive area of tender erythema across his anterior medial thigh. He took to doxycycline yesterday that he had leftover from a previous prescription. The patient complains of weeping edema coming out of his toes 08/08/16; I saw this patient on 10/2. He was tender across his anterior thigh. I put him on doxycycline. He returns today in follow-up. He does not have any open wounds on his lower leg, he still has edema weeping into his toes. 08/12/16; patient was seen back urgently today to follow-up for his  extensive left thigh cellulitis/erysipelas. He comes back with a lot less swelling and erythema pain is much better. I believe I gave him Augmentin and Cipro. His wrap was cut down as he stated a roll down his legs. He developed blistering above the level of the wrap that remained. He has 2 open blisters and 1 intact. 08/19/16; patient is been doing his primary doctor who is increased his Lasix from 40-80 once a day or 80 already has less edema. Cellulitis has remained improved in the left thigh. 2 open areas on the posterior left calf 08/26/16; he returns today having new open blisters on the anterior part of his left leg. He has his compression pumps but is not yet been shown how to use some vital representative from the supplier. 09/02/16 patient returns today with no open wounds on the left leg. Some maceration in his plantar toes 09/10/2016 -- Dr. Leanord Hawking had recently discharged him on 09/02/2016 and he has come right back with redness swelling and some open ulcers on his left lower extremity. He says this was caused by trying to apply his compression stockings and he's been unable to use this and has not been able to use his lymphedema pumps. He had some doxycycline leftover and he has started on this a few days ago. 09/16/16; there are no open wounds on his leg on the left and no evidence of cellulitis. 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 He does not complain of symptoms here. I am not clear use using his external compression pumps. 09/23/16; I have not seen this patient in 2 weeks. He canceled his appointment 10 days ago as he was going on vacation. He tells me that on Monday he noticed a large area on his posterior left leg which is been draining copiously and is reopened into a large wound. He is been using ABDs and the external part of his juxtalite, according to our nurse this was not on properly. 10/07/16; Still a substantial  area on the posterior left leg. Using silver alginate 10/14/16; in general better although there is still open area which looks healthy. Still using silver alginate. He reminds me that this happen before he left for [facility]. T oday while  he was showering in the morning. He had been using his juxtalite's 10/21/16; the area on his posterior left leg is fully epithelialized. However he arrives today with a large area of tender erythema in his medial and posterior left thigh just above the knee. I have marked the area. Once again he is reluctant to consider hospitalization. I treated him with oral antibiotics in the past for a similar situation with resolution I think with doxycycline however this area it seems more extensive to me. He is not complaining of fever but does have chills and says states he is thirsty. His blood sugar today was in the 140s at home 10/25/16 the area on his posterior left leg is fully epithelialized although there is still some weeping edema. The large area of tenderness and erythema in his medial and posterior left thigh is a lot less tender although there is still a lot of swelling in this thigh. He states he feels a lot better. He is on doxycycline and Augmentin that I started last week. This will continued until Tuesday, December 26. I have ordered a duplex ultrasound of the left thigh rule out DVT whether there is an abscess something that would need to be drained I would also like to know. 11/01/16; he still has weeping edema from a not fully epithelialized area on his left posterior calf. Most of the rest of this looks a lot better. He has completed his antibiotics. His thigh is a lot better. Duplex ultrasound did not show a DVT in the thigh 11/08/16; he comes in today with more Denuded surface epithelium from the posterior aspect of his calf. There is no real evidence of cellulitis. The superior aspect of his wrap appears to have put quite an indentation in his leg  just below the knee and this may have contributed. He does not complain of pain or fever. We have been using silver alginate as the primary dressing. The area of cellulitis in the right thigh has totally resolved. He has been using his compression stockings once a week 11/15/16; the patient arrives today with more loss of epithelium from the posterior aspect of his left calf. He now has a fairly substantial wound in this area. The reason behind this deterioration isn't exactly clear although his edema is not well controlled. He states he feels he is generally more swollen systemically. He is not complaining of chest pain shortness of breath fever. T me he has an appointment with his primary physician in early February. He is on 80 mg of oral ells Lasix a day. He claims compliance with the external compression pumps. He is not having any pain in his legs similar to what he has with his recurrent cellulitis 11/22/16; the patient arrives a follow-up of his large area on his left lateral calf. This looks somewhat better today. He came in earlier in the week for a dressing change since I saw him a week ago. He is not complaining of any pain no shortness of breath no chest pain 11/28/16; the patient arrives for follow-up of his large area on the left lateral calf this does not look better. In fact it is larger weeping edema. The surface of the wound does not look too bad. We have been using silver alginate although I'm not certain that this is a dressing issue. 12/05/16; again the patient follows up for a large wound on the left lateral and left posterior calf this does not look better. There continues to be weeping  edema necrotic surface tissue. More worrisome than this once again there is erythema below the wound involving the distal Achilles and heel suggestive of cellulitis. He is on his feet working most of the day of this is not going well. We are changing his dressing twice a week to facilitate the  drainage. 12/12/16; not much change in the overall dimensions of the large area on the left posterior calf. This is very inflamed looking. I gave him an. Doxycycline last week does not really seem to have helped. He found the wrap very painful indeed it seems to of dog into his legs superiorly and perhaps around the heel. He came in early today because the drainage had soaked through his dressings. 12/19/16- patient arrives for follow-up evaluation of his left lower extremity ulcers. He states that he is using his lymphedema pumps once daily when there is "no drainage". He admits to not using his lipedema pumps while under current treatment. His blood sugars have been consistently between 150-200. 12/26/16; the patient is not using his compression pumps at home because of the wetness on his feet. I've advised him that I think it's important for him to use this daily. He finds his feet too wet, he can put a plastic bag over his legs while he is in the pumps. Otherwise I think will be in a vicious circle. We are using silver alginate to the major area on his left posterior calf 01/02/17; the patient's posterior left leg has further of all into 3 open wounds. All of them covered with a necrotic surface. He claims to be using his compression pumps once a day. His edema control is marginal. Continue with silver alginate 01/10/17; the patient's left posterior leg actually looks somewhat better. There is less edema, less erythema. Still has 3 open areas covered with a necrotic surface requiring debridement. He claims to be using his compression pumps once a day his edema control is better 01/17/17; the patient's left posterior calf look better last week when I saw him and his wrap was changed 2 days ago. He has noted increasing pain in the left heel and arrives today with much larger wounds extensive erythema extending down into the entire heel area especially tender medially. He is not systemically unwell CBGs have  been controlled no fever. Our intake nurse showed me limegreen drainage on his AVD pads. 01/24/17; his usual this patient responds nicely to antibiotics last week giving him Levaquin for presumed Pseudomonas. The whole entire posterior part of his leg is much better much less inflamed and in the case of his Achilles heel area much less tender. He has also had some epithelialization posteriorly there are still open areas here and still draining but overall considerably better 01/31/17- He has continue to tolerate the compression wraps. he states that he continues to use the lymphedema pumps daily, and can increase to twice daily on the weekends. He is voicing no complaints or concerns regarding his LLE ulcers 02/07/17-he is here for follow-up evaluation. He states that he noted some erythema to the left medial and anterior thigh, which he states is new as of yesterday. He is concerned about recurrent cellulitis. He states his blood sugars have been slightly elevated, this morning in the 180s 02/14/17; he is here for follow-up evaluation. When he was last here there was erythema superiorly from his posterior wound in his anterior thigh. He was prescribed Levaquin however a culture of the wound surface grew MRSA over the phone I changed  him to doxycycline on Monday and things seem to be a lot better. 02/24/17; patient missed his appointment on Friday therefore we changed his nurse visit into a physician visit today. Still using silver alginate on the large area of the posterior left thigh. He isn't new area on the dorsal left second toe 03/03/17; actually better today although he admits he has not used his external compression pumps in the last 2 days or so because of work responsibilities over the weekend. 03/10/17; continued improvement. External compression pumps once a day almost all of his wounds have closed on the posterior left calf. Better edema control 03/17/17; in general improved. He still has 3 small  open areas on the lateral aspect of his left leg however most of the area on the posterior part of his leg is epithelialized. He has better edema control. He has an ABD pad under his stocking on the right anterior lower leg although he did not let us look at that today. 03/24/17; patient arrives back in clinic today with no open areas however there are areas on the posterior left calf and anterior left calf that are less than 100% epithelialized. His edema is well controlled in the left lower leg. There is some pitting edema probably lymphedema in the left upper thigh. He uses compression pumps at home once per day. I tried to get him to do this twice a day although he is very reticent. 04/01/2017 -- for the last 2 days he's had significant redness, tenderness and weeping and came in for an urgent visit today. 04/07/17; patient still has 6 more days of doxycycline. He was seen by Dr. Meyer Russel last Wednesday for cellulitis involving the posterior aspect, lateral aspect of his Involving his heel. For the most part he is better there is less erythema and less weeping. He has been on his feet for 12 hours 2 over the weekend. Using his compression pumps once a day 04/14/17 arrives today with continued improvement. Only one area on the posterior left calf that is not fully epithelialized. He has intense bilateral venous inflammation associated with his chronic venous insufficiency disease and secondary lymphedema. We have been using silver alginate to the left posterior calf wound In passing he tells [place] today that the right leg but we have not seen in quite some time has an open area on it but he doesn't want [place] to look at this today states he will show this to [place] next week. 04/21/17; there is no open area on his left leg although he still reports some weeping edema. He showed [place] his right leg today which is the first time we've seen this leg in a long time. He has a large area of open wound on the right leg  anteriorly healthy granulation. Quite a bit of swelling in the right leg and some degree of venous inflammation. He told us about the right leg in passing last week but states that deterioration in the right leg really only happened over the weekend 04/28/17; there is no open area on the left leg although there is an irritated part on the posterior which is like a wrap injury. The wound on the right leg which was new from last week at least to [place] is a lot better. 05/05/17; still no open area on the left leg. Patient is using his new compression stocking which seems to be doing a good job of controlling the edema. He states he is using his compression pumps once per  day. The right leg still has an open wound although it is better in terms of surface area. Required debridement. A lot of pain in the posterior right Achilles marked tenderness. Usually this type of presentation this patient gives concern for an active cellulitis 05/12/17; patient arrives today with his major wound from last week on the right lateral leg somewhat better. Still requiring debridement. He was using his compression stocking on the left leg however that is reopened with superficial wounds anteriorly he did not have an open wound on this leg previously. He is still using his juxta light's once daily at night. He cannot find the time to do this in the morning as he has to be at work by 7 AM 05/19/17; right lateral leg wound looks improved. No debridement required. The concerning area is on the left posterior leg which appears to almost have a subcutaneous hemorrhagic component to it. We've been using silver alginate to all the wounds 05/26/17; the right lateral leg wound continues to look improved. However the area on the left posterior calf is a tightly adherent surface. [place] using silver alginate. Because of the weeping edema in his legs there is very little good alternatives. 06/02/17; the patient left here last week looking quite  good. Major wound on the left posterior calf and a small one on the right lateral calf. Both of these look satisfactory. He tells me that by Wednesday he had noted increased pain in the left leg and drainage. He called on Thursday and Friday to get an appointment here but we were blocked. He did not go to urgent care or his primary physician. He thinks he had a fever on Thursday but did not actually take his temperature. He has not been using his compression pumps on the left leg because of pain. I advised him to go to the emergency room today for IV antibiotics for stents of left leg cellulitis but he has refused I have asked him to take 2 days off work to keep his leg elevated and he has refused this as well. In view of this I'm going to call him and Augmentin and doxycycline. 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 However after a detailed discussion regarding the risk benefits and alternatives he refuses to get admitted to the hospital. With no other recourse I will continue him on oral antibiotics as before and hopefully he'll have his infectious disease consultation this week. 06/16/2017 -- the patient was seen today by the nurse practitioner at infectious disease Ms. Dixon. 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 He has also been advise Lotrimin cream locally. she also had wise IV antibiotics for his condition if it flares up 06/23/17; patient arrives today with drainage bilaterally although the remaining wound on the left posterior calf after cleaning up today "highlighter  yellow drainage" did not look too bad. Unfortunately he has had breakdown on the right anterior leg [previously this leg had not been open and he is using a black stocking] he went to see infectious disease and is been  put on clindamycin 150 mg daily, I did not verify the dose although I'm not familiar with using clindamycin in this dosing range, perhaps for prophylaxisoo 06/27/17; I brought this patient back today to follow-up on the wound deterioration on the right lower leg together with surrounding cellulitis. I started him on doxycycline 4 days ago. This area looks better however he comes in today with intense cellulitis on the medial part of his left thigh. This is not have a wound in this area. Extremely tender. We've been using silver alginate to the wounds on the right lower leg left lower leg with bilateral 4 layer compression he is using his external compression pumps once a day 07/04/17; patient's left medial thigh cellulitis looks better. He has not been using his compression pumps as his insert said it was contraindicated with cellulitis. His right leg continues to make improvements all the wounds are still open. We only have one remaining wound on the left posterior calf. Using silver alginate to all open areas. He is on doxycycline which I started a week ago and should be finishing I gave him Augmentin after Thursday's visit for the severe cellulitis on the left medial thigh which fortunately looks better 07/14/17; the patient's left medial thigh cellulitis has resolved. The cellulitis in his right lower calf on the right also looks better. All of his wounds are stable to improved we've been using silver alginate he has completed the antibiotics I have given him. He has clindamycin 150 mg once a day prescribed by infectious disease for prophylaxis, I've advised him to start this now. We have been using bilateral Unna boots over silver alginate to the wound areas 07/21/17; the patient is  been to see infectious disease who noted his recurrent problems with cellulitis. He was not able to tolerate prophylactic clindamycin therefore he is on amoxicillin 500 twice a day. He also had a second daily dose of Lasix added By Dr. Oneta Rack but he is not taking this. Nor is he being completely compliant with his compression pumps a especially not this week. He has 2 remaining wounds one on the right posterior lateral lower leg and one on the left posterior medial lower leg. 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 The patient has Unna boots bilaterally. 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 He has bilateral Unna boots for compression and silver alginate to his wounds. Arrives today with his legs looking as good as I have seen him in quite some time. Not surprisingly his wounds look better as well with improvement on the right lateral leg venous insufficiency wound and also the left medial leg. He is still using the compression pumps once a day 08/11/17; both legs appear to be doing better wounds on the right lateral and left medial legs look better. Skin on the right leg quite good. He is been using silver alginate as the primary dressing. I'm going to use Anasept gel calcium alginate and maintain all the secondary dressings 08/18/17; the patient continues to actually do quite well. The area on his right lateral leg is just about closed the left medial also looks better although it is still moist in this area. His edema is well controlled we have been using Anasept gel with calcium alginate and the usual secondary  dressings, 4 layer compression and once daily use of his compression pumps "always been able to manage 09/01/17; the patient continues to do reasonably well in  spite of his trip to [place]. The area on the right lateral leg is epithelialized. Left is much better but still open. He has more edema and more chronic erythema on the left leg [venous inflammation] 09/08/17; he arrives today with no open wound on the right lateral leg and decently controlled edema. Unfortunately his left leg is not nearly as in his good situation as last week.he apparently had increasing edema starting on Saturday. He edema soaked through into his foot so used a plastic bag to walk around his home. The area on the medial right leg which was his open area is about the same however he has lost surface epithelium on the left lateral which is new and he has significant pain in the Achilles area of the left foot. He is already on amoxicillin chronically for prophylaxis of cellulitis in the left leg 09/15/17; he is completed a week of doxycycline and the cellulitis in the left posterior leg and Achilles area is as usual improved. He still has a lot of edema and fluid soaking through his dressings. There is no open wound on the right leg. He saw infectious disease NP today 09/22/17;As usual 1 we transition him from our compression wraps to his stockings things did not go well. He has several small open areas on the right leg. He states this was caused by the compression wrap on his skin although he did not wear this with the stockings over them. He has several superficial areas on the left leg medially laterally posteriorly. He does not have any evidence of active cellulitis especially involving the left Achilles The patient is traveling from [facility] Saturday going to [facility]. He states he isn't attempting to get an appointment with a heel objects wound center there to change his dressings. I am not completely certain whether this will work 10/06/17; the patient came in on Friday for a nurse visit and the nurse reported that his legs actually look quite good. He arrives in clinic  today for his regular follow-up visit. He has a new wound on his left third toe over the PIP probably caused by friction with his footwear. He has small areas on the left leg and a very superficial but epithelialized area on the right anterior lateral lower leg. Other than that his legs look as good as I've seen him in quite some time. We have been using silver alginate Review of systems; no chest pain no shortness of breath other than this a 10 point review of systems negative 10/20/17; seen by Dr. Meyer Russel last week. He had taken some antibiotics [doxycycline] that he had left over. Dr. Meyer Russel thought he had candida infection and declined to give him further antibiotics. He has a small wound remaining on the right lateral leg several areas on the left leg including a larger area on the left posterior several left medial and anterior and a small wound on the left lateral. The area on the left dorsal third toe looks a lot better. ROS; Gen.; no fever, respiratory no cough no sputum Cardiac no chest pain other than this 10 point review of system is negative 10/30/17; patient arrives today having fallen in the bathtub 3 days ago. It took him a while to get up. He has pain and maceration in the wounds on his left leg which have  deteriorated. He has not been using his pumps he also has some maceration on the right lateral leg. 11/03/17; patient continues to have weeping edema especially in the left leg. This saturates his dressings which were just put on on 12/27. As usual the doxycycline seems to take care of the cellulitis on his lower leg. He is not complaining of fever, chills, or other systemic symptoms. He states his leg feels a lot better on the doxycycline I gave him empirically. He also apparently gets injections at his primary doctor's officeo Rocephin for cellulitis prophylaxis. I didn't ask him about his compression pump compliance today I think that's probably marginal. Arrives in the clinic  with all of his dressings primary and secondary macerated full of fluid and he has bilateral edema 11/10/17; the patient's right leg looks some better although there is still a cluster of wounds on the right lateral. The left leg is inflamed with almost circumferential skin loss medially to laterally although we are still maintaining anteriorly. He does not have overt cellulitis there is a lot of drainage. He is not using compression pumps. We have been using silver alginate to the wound areas, there are not a lot of options here 11/17/17; the patient's right leg continues to be stable although there is still open wounds, better than last week. The inflammation in the left leg is better. Still loss of surface layer epithelium especially posteriorly. There is no overt cellulitis in the amount of edema and his left leg is really quite good, tells me he is using his compression pumps once a day. 11/24/17; patient's right leg has a small superficial wound laterally this continues to improve. The inflammation in the left leg is still improving however we have continuous surface layer epithelial loss posteriorly. There is no overt cellulitis in the amount of edema in both legs is really quite good. He states he is using his compression pumps on the left leg once a day for 5 out of 7 days 12/01/17; very small superficial areas on the right lateral leg continue to improve. Edema control in both legs is better today. He has continued loss of surface epithelialization and left posterior calf although I think this is better. We have been using silver alginate with large number of absorptive secondary dressings 4 layer on the left Unna boot on the right at his request. He tells me he is using his compression pumps once a day 12/08/17; he has no open area on the right leg is edema control is good here. On the left leg however he has marked erythema and tenderness breakdown of skin. He has what appears to be a wrap injury  just distal to the popliteal fossa. 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 There was swelling erythema and breakdown in the right leg. He has much worse skin breakdown in the left leg as well multiple open areas medially and posteriorly as well as laterally. He tells me he has been using his compression pumps but tells me he feels that the drainage out of his leg is worse when he uses a compression pumps. T be fair to him he is been saying this o for a while however I don't know that I  have really been listening to this. I wonder if the compression pumps are working properly 12/22/17;. Once again he arrives with severe erythema, weeping edema from the left greater than right leg. Noncompliance with compression pumps. New this visit he is complaining of pain on the lateral aspect of the right leg and the medial aspect of his right thigh. He apparently saw his cardiologist Dr. [name] who was ordered an echocardiogram area and I think this is a step in the right direction 12/25/17; started his doxycycline Monday night. There is still intense erythema of the right leg especially in the anterior thigh although there is less tenderness. The erythema around the wound on the right lateral calf also is less tender. He still complaining of pain in the left heel. His wounds are about the same right lateral left medial left lateral. Superficial but certainly not close to closure. He denies being systemically unwell no fever chills no abdominal pain no diarrhea 12/29/17; back in follow-up of his extensive right calf and right thigh cellulitis. I added amoxicillin to cover possible doxycycline resistant strep. This seems to of done the  trick he is in much less pain there is much less erythema and swelling. He has his echocardiogram at 11:00 this morning. X-ray of the left heel was also negative. 01/05/18; the patient arrived with his edema under much better control. Now that he is retired he is able to use his compression pumps daily and sometimes twice a day per the patient. He has a wound on the right leg the lateral wound looks better. Area on the left leg also looks a lot better. He has no evidence of cellulitis in his bilateral thighs I had a quick peak at his echocardiogram. He is in normal ejection fraction and normal left ventricular function. He has moderate pulmonary hypertension moderately reduced right ventricular function. One would have to wonder about chronic sleep apnea although he says he doesn't snore. He'll review the echocardiogram with his cardiologist. 01/12/18; the patient arrives with the edema in both legs under exemplary control. He is using his compression pumps daily and sometimes twice daily. His wound on the right lateral leg is just about closed. He still has some weeping areas on the posterior left calf and lateral left calf although everything is just about closed here as well. I have spoken with Aldean Baker who is the patient's nurse practitioner and infectious disease. She was concerned that the patient had not understood that the parenteral penicillin injections he was receiving for cellulitis prophylaxis was actually benefiting him. I don't think the patient actually saw that I would tend to agree we were certainly dealing with less infections although he had a serious one last month. 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 He is healed. He'll be placed in juxtalite compression wraps and increase his lymphedema pumps to twice daily. We will follow up again next week to ensure there are no issues with the new regiment. 01/20/18-he is here for evaluation of  bilateral lower extremity weeping edema. Yesterday he was placed in compression wrap to the right lower extremity and compression stocking to left lower shrubbery. He states he uses lymphedema pumps last night and again this morning and noted a blister to the left lower extremity. On exam he was noted to have drainage to the right lower extremity. He will be placed in Unna boots bilaterally and follow-up next week 01/26/18; patient was actually discharged a week ago to his  own juxta light stockings only to return the next day with bilateral lower extremity weeping edema.he was placed in bilateral Unna boots. He arrives today with pain in the back of his left leg. There is no open area on the right leg however there is a linear/wrap injury on the left leg and weeping edema on the left leg posteriorly. I spoke with infectious disease about 10 days ago. They were disappointed that the patient elected to discontinue prophylactic intramuscular penicillin shots as they felt it was particularly beneficial in reducing the frequency of his cellulitis. I discussed this with the patient today. He does not share this view. He'll definitely need antibiotics today. Finally he is traveling to [place] and trauma leaving this Saturday and returning a week later and he does not travel with his pumps. He is going by car 01/30/18; patient was seen 4 days ago and brought back in today for review of cellulitis in the left leg posteriorly. I put him on amoxicillin this really hasn't helped as much as I might like. He is also worried because he is traveling to [facility] by car. Finally we will be rewrapping him. There is no open area on the right leg over his left leg has multiple weeping areas as usual 02/09/18; The same wrap on for 10 days. He did not pick up the last doxycycline I prescribed for him. He apparently took 4 days worth he already had. There is nothing open on his right leg and the edema control is really  quite good. He's had damage in the left leg medially and laterally especially probably related to the prolonged use of Unna boots 02/12/18; the patient arrived in clinic today for a nurse visit/wrap change. He complained of a lot of pain in the left posterior calf. He is taking doxycycline that I previously prescribed for him. Unfortunately even though he used his stockings and apparently used to compression pumps twice a day he has weeping edema coming out of the lateral part of his right leg. This is coming from the lower anterior lateral skin area. 02/16/18; the patient has finished his doxycycline and will finish the amoxicillin 2 days. The area of cellulitis in the left calf posteriorly has resolved. He is no longer having any pain. He tells me he is using his compression pumps at least once a day sometimes twice. 02/23/18; the patient finished his doxycycline and Amoxil last week. On Friday he noticed a small erythematous circle about the size of a quarter on the left lower leg just above his ankle. This rapidly expanded and he now has erythema on the lateral and posterior part of the thigh. This is bright red. Also has an area on the dorsal foot just above his toes and a tender area just below the left popliteal fossa. He came off his prophylactic penicillin injections at his own insistence one or 2 months ago. This is obviously deteriorated since then 03/02/18; patient is on doxycycline and Amoxil. Culture I did last week of the weeping area on the back of his left calf grew group B strep. I have therefore renewed the amoxicillin 500 3 times a day for a further week. He has not been systemically unwell. Still complaining of an area of discomfort right under his left popliteal fossa. There is no open wound on the right leg. He tells me that he is using his pumps twice a day on most days 03/09/18; patient arrives in clinic today completing his amoxicillin today. The  cellulitis on his left leg is  better. Furthermore he tells me that he had intramuscular penicillin shots that his primary care office today. However he also states that the wrap on his right leg fell down shortly after leaving clinic last week. He developed a large blister that was present when he came in for a nurse visit later in the week and then he developed intense discomfort around this area.He tells me he is using his compression pumps 03/16/18; the patient has completed his doxycycline. The infectious part of this/cellulitis in the left heel area left popliteal area is a lot better. He has 2 open areas on the right calf. Still areas on the left calf but this is a lot better as well. 03/24/18; the patient arrives complaining of pain in the left popliteal area again. He thinks some of this is wrap injury. He has no open area on the right leg and really no open area on the left calf either except for the popliteal area. He claims to be compliant with the compression pumps 03/31/18; I gave him doxycycline last week because of cellulitis in the left popliteal area. This is a lot better although the surface epithelium is denuded off and response to this. He arrives today with uncontrolled edema in the right calf area as well as a fingernail injury in the right lateral calf. There is only a few open areas on the left 04/06/18; I gave him amoxicillin doxycycline over the last 2 weeks that the amoxicillin should be completing currently. He is not complaining of any pain or systemic symptoms. The only open areas see has is on the right lateral lower leg paradoxically I cannot see anything on the left lower leg. He tells me he is using his compression pumps twice a day on most days. Silver alginate to the wounds that are open under 4 layer compression 04/13/18; he completed antibiotics and has no new complaints. Using his compression pumps. Silver alginate that anything that's opened 04/20/18; he is using his compression pumps religiously.  Silver alginate 4 layer compression anything that's opened. He comes in today with no open wounds on the left leg but 3 on the right including a new one posteriorly. He has 2 on the right lateral and one on the right posterior. He likes Unna boots on the right leg for reasons that aren't really clear we had the usual 4 layer compression on the left. It may be necessary to move to the 4 layer compression on the right however for now I left them in the Unna boots 04/27/18; he is using his compression pumps at least once a day. He has still the wounds on the right lateral calf. The area right posteriorly has closed. He does not have an open wound on the left under 4 layer compression however on the dorsal left foot just proximal to the toes and the left third toe 2 small open areas were identified 05/11/18; he has not uses compression pumps. The areas on the right lateral calf have coalesced into one large wound necrotic surface. On the left side he has one small wound anteriorly however the edema is now weeping out of a large part of his left leg. He says he wasn't using his pumps because of the weeping fluid. I explained to him that this is the time he needs to pump more 05/18/18; patient states he is using his compression pumps twice a day. The area on the right lateral large wound albeit superficial.  On the left side he has innumerable number of small new wounds on the left calf particularly laterally but several anteriorly and medially. All these appear to have healthy granulated base these look like the remnants of blisters however they occurred under compression. The patient arrives in clinic today with his legs somewhat better. There is certainly less edema, less multiple open areas on the left calf and the right anterior leg looks somewhat better as well superficial and a little smaller. However he relates pain and erythema over the last 3-4 days in the thigh and I looked at this today. He has not  been systemically unwell no fever no chills no change in blood sugar values 05/25/18; comes in today in a better state. The severe cellulitis on his left leg seems better with the Keflex. Not as tender. He has not been systemically unwell Hard to find an open wound on the left lower leg using his compression pumps twice a day The confluent wounds on his right lateral calf somewhat better looking. These will ultimately need debridement I didn't do this today. 06/01/18; the severe cellulitis on the left anterior thigh has resolved and he is completed his Keflex. There is no open wound on the left leg however there is a superficial excoriation at the base of the third toe dorsally. Skin on the bottom of his left foot is macerated looking. The left the wounds on the lateral right leg actually looks some better although he did require debridement of the top half of this wound area with an open curet 06/09/18 on evaluation today patient appears to be doing poorly in regard to his right lower extremity in particular this appears to likely be infected he has very thick purulent discharge along with a bright green tent to the discharge. This makes me concerned about the possibility of pseudomonas. He's also having increased discomfort at this point on evaluation. Fortunately there does not appear to be any evidence of infection spreading to the other location at this time. 06/16/18 on evaluation today patient appears to actually be doing fairly well. His ulcer has actually diminished in size quite significantly at this point which is good news. Nonetheless he still does have some evidence of infection he did see infectious disease this morning before coming here for his appointment. I did review the results of their evaluation and their note today. They did actually have him discontinue the Cipro and initiate treatment with linezolid at this time. He is doing this for the next seven days and they recommended a  follow-up in four months with them. He is the keep a log of the need for intermittent antibiotic therapy between now and when he falls back up with infectious disease. This will help them gaze what exactly they need to do to try and help them out. 06/23/18; the patient arrives today with no open wounds on the left leg and left third toe healed. He is been using his compression pumps twice a day. On the right lateral leg he still has a sizable wound but this is a lot better than last time I saw this. 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 Has been using silver alginate under 4 layer compression 06/30/18; the only open wound he has is on the right lateral leg and this looks healthy. No debridement is required. We have been using silver alginate. He does not  have an open wound on the left leg. There is apparently some drainage from the dorsal proximal third toe on the left although I see no open wound here. 07/03/18 on evaluation today patient was actually here just for a nurse visit rapid change. However when he was here on Wednesday for his rat change due to having been healed on the left and then developing blisters we initiated the wrap again knowing that he would be back today for [place] to reevaluate and see were at. Unfortunately he has developed some cellulitis into the proximal portion of his right lower extremity even into the region of his thigh. 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 He was placed on one as what at that point. Nonetheless he is done with that and has been tolerating it well otherwise. Doxycycline which in the past really did not seem to be effective for him. Nonetheless I think the best option may be for [place] to definitely reinitiate the antibiotics for a longer period of time. 07/07/18; since I last saw this patient a week ago he has  had a difficult time. At that point he did not have an open wound on his left leg. We transitioned him into juxta light stockings. He was apparently in the clinic the next day with blisters on the left lateral and left medial lower calf. He also had weeping edema fluid. He was put back into a compression wrap. He was also in the clinic on Friday with intense erythema in his right thigh. Per the patient he was started on Bactrim however that didn't work at all in terms of relieving his pain and swelling. He has taken 3 doxycycline that he had left over from last time and that seems to of helped. He has blistering on the right thigh as well. 07/14/18; the erythema on his right thigh has gotten better with doxycycline that he is finishing. 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 Presumably this cellulitis in the thigh was not related to that although I think this is a bit concerning going forward. He still has an area on the right lateral calf the blister on the right medial calf just below the knee that was discussed above. On the left 2 small open areas left medial and left lateral. Edema control is adequate. He is using his compression pumps twice a day 07/20/18; continued improvement in the condition of both legs especially the edema in his bilateral thighs. He tells me he is been losing weight through a combination of diet and exercise. He is using his compression pumps twice a day. So overall she made to the remaining wounds 07/27/2018; continued improvement in condition of both legs. His edema is well controlled. The area on the right lateral leg is just about closed he had one blisters show up on the medial left upper calf. We have him in 4 layer compression. He is going on a 10-day trip to [place], [place] and [place]. He will be driving. He wants to wear Unna boots because of the lessening amount of constriction.  He will not use compression pumps while he is away 08/05/18 on evaluation today patient actually appears to be doing decently well all things considered in regard to his bilateral lower extremities. The worst ulcer is actually only posterior aspect of his left lower extremity with a four  layer compression wrap cut into his leg a couple weeks back. He did have a trip and actually had [employer] for the trip that he is worn since he was last here. Nonetheless he feels like the [employer] actually do better for him his swelling is up a little bit but he also with his trip was not taking his Lasix on a regular set schedule like he was supposed to be. He states that obviously the reason being that he cannot drive and keep going without having to urinate too frequently which makes it difficult. He did not have his pumps with him while he was away either which I think also maybe playing a role here too. 08/13/2018; the patient only has a small open wound on the right lateral calf which is a big improvement in the last month or 2. He also has the area posteriorly just below the posterior fossa on the left which I think was a wrap injury from several weeks ago. He has no current evidence of cellulitis. He tells me he is back into his compression pumps twice a day. He also tells me that while he was at the laundromat somebody stole a section of his extremitease stockings 08/20/2018; back in the clinic with a much improved state. He only has small areas on the right lateral mid calf which is just about healed. This was is more substantial area for quite a prolonged period of time. He has a small open area on the left anterior tibia. The area on the posterior calf just below the popliteal fossa is closed today. He is using his compression pumps twice a day 08/28/2018; patient has no open wound on the right leg. He has a smattering of open areas on the calf with some weeping lymphedema. More  problematically than that it looks as though his wraps of slipped down in his usual he has very angry upper area of edema just below the right medial knee and on the right lateral calf. He has no open area on his feet. The patient is traveling to [facility] next week. I will send him in an antibiotic. We will continue to wrap the right leg. We ordered extremitease stockings for him last week and I plan to transition the right leg to a stocking when he gets home which will be in 10 days time. As usual he is very reluctant to take his pumps with him when he travels 09/07/2018; patient returns from [facility]. He shows me a picture of his left leg in the mid part of his trip last week with intense fire engine erythema. The picture look bad enough I would have considered sending him to the hospital. Instead he went to the wound care center in [facility]. They did not prescribe him antibiotics but he did take some doxycycline he had leftover from a previous visit. I had given him trimethoprim sulfamethoxazole before he left this did not work according to the patient. This is resulted in some improvement fortunately. He comes back with a large wound on the left posterior calf. Smaller area on the left anterior tibia. Denuded blisters on the dorsal left foot over his toes. Does not have much in the way of wounds on the right leg although he does have a very tender area on the right posterior area just below the popliteal fossa also suggestive of infection. He promises me he is back on his pumps twice a day 09/15/2018; the intense  cellulitis in his left lower calf is a lot better. The wound area on the posterior left calf is also so better. However he has reasonably extensive wounds on the dorsal aspect of his second and third toes and the proximal foot just at the base of the toes. There is nothing open on the right leg 09/22/2018; the patient has excellent edema control in his legs  bilaterally. He is using his external compression pumps twice a day. He has no open area on the right leg and only the areas in the left foot dorsally second and third toe area on the left side. He does not have any signs of active cellulitis. 10/06/2018; the patient has good edema control bilaterally. He has no open wound on the right leg. There is a blister in the posterior aspect of his left calf that we had to deal with today. He is using his compression pumps twice a day. There is no signs of active cellulitis. We have been using silver alginate to the wound areas. He still has vulnerable areas on the base of his left first second toes dorsally He has a his extremities stockings and we are going to transition him today into the stocking on the right leg. He is cautioned that he will need to continue to use the compression pumps twice a day. If he notices uncontrolled edema in the right leg he may need to go to 3 times a day. 10/13/2018; the patient came in for a nurse check on Friday he has a large flaccid blister on the right medial calf just below the knee. We unroofed this. He has this and a new area underneath the posterior mid calf which was undoubtedly a blister as well. He also has several small areas on the right which is the area we put his extremities stocking on. 10/19/2018; the patient went to see infectious disease this morning I am not sure if that was a routine follow-up in any case the doxycycline I had given him was discontinued and started on linezolid. He has not started this. It is easy to look at his left calf and the inflammation and think this is cellulitis however he is very tender in the tissue just below the popliteal fossa and I have no doubt that there is infection going on here. He states the problem he is having is that with the compression pumps the edema goes down and then starts walking the wrap falls down. We will see if we can adhere this. He has 1 or 2 minuscule  open areas on the right still areas that are weeping on the posterior left calf, the base of his left second and third toes 10/26/18; back today in clinic with quite of skin breakdown in his left anterior leg. This may have been infection the area below the popliteal fossa seems a lot better however tremendous epithelial loss on the left anterior mid tibia area over quite inexpensive tissue. He has 2 blisters on the right side but no other open wound here. 10/29/2018; came in urgently to see [place] today and we worked him in for review. He states that the 4 layer compression on the right leg caused pain he had to cut it down to roughly his mid calf this caused swelling above the wrap and he has blisters and skin breakdown today. As a result of the pain he has not been using his pumps. Both legs are a lot more edematous and there is a lot of  weeping fluid. 11/02/18; arrives in clinic with continued difficulties in the right leg> left. Leg is swollen and painful. multiple skin blisters and new open areas especially laterally. He has not been using his pumps on the right leg. 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 He is not systemically unwell. 11/09/2018; the patient claims he is being compliant with his pumps. He is finished the doxycycline I gave him last week. Culture I did of the wound on the right lateral leg showed a few very resistant methicillin staph aureus. This was resistant to doxycycline. Nevertheless he states the pain in the leg is a lot better which makes me wonder if the cultured organism was not really what was causing the problem nevertheless this is a very dangerous organism to be culturing out of any wound. His right leg is still a lot larger than the left. He is using an Radio broadcast assistant on this area, he blames a 4-layer compression for causing the original skin breakdown which I doubt is true however I cannot talk him out of it. We have been  using silver alginate to all of these areas which were initially blisters 11/16/2018; patient is being compliant with his external compression pumps at twice a day. Miraculously he arrives in clinic today with absolutely no open wounds. He has better edema control on the left where he has been using 4 layer compression versus wound of wounds on the right and I pointed this out to him. There is no inflammation in the skin in his lower legs which is also somewhat unusual for him. There is no open wounds on the dorsal left foot. He has extremitease stockings at home and I have asked him to bring these in next week. 11/25/18 patient's lower extremity on examination today on the left appears for the most part to be wound free. He does have an open wound on the lateral aspect of the right lower extremity but this is minimal compared to what I've seen in past. He does request that we go ahead and wrap the left leg as well even though there's nothing open just so hopefully it will not reopen in short order. 1/28; patient has superficial open wounds on the right lateral calf left anterior calf and left posterior calf. His edema control is adequate. 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 We have been using silver alginate as the primary dressing. He claims compliance with his compression pumps 2/4; patient has superficial open wounds on numerous areas of his left calf and again one on the left dorsal foot. The areas on the right lateral calf have healed. The cellulitis that I gave him doxycycline for last week is also resolved this was mostly on the left anterior calf just below the tibial tuberosity. His edema looks fairly well-controlled. He tells me he went to see his primary doctor today and had blood work ordered 2/11; once again he has several open areas on the left calf left tibial area. Most of these are small and  appear to have healthy granulation. He does not have anything open on the right. The edema and control in his thighs is pretty good which is usually a good indication he has been using his pumps as requested. 2/18; he continues to have several small areas on the left calf and left tibial area. Most of these are small healthy  granulation. We put him in his stocking on the right leg last week and he arrives with a superficial open area over the right upper tibia and a fairly large area on the right lateral tibia in similar condition. His edema control actually does not look too bad, he claims to be using his compression pumps twice a day 2/25. Continued small areas on the left calf and left tibial area. New areas especially on the right are identified just below the tibial tuberosity and on the right upper tibia itself. There are also areas of weeping edema fluid even without an obvious wound. He does not have a considerable degree of lymphedema but clearly there is more edema here than his skin can handle. He states he is using the pumps twice a day. We have an Unna boot on the right and 4 layer compression on the left. 3/3; he continues to have an area on the right lateral calf and right posterior calf just below the popliteal fossa. There is a fair amount of tenderness around the wound on the popliteal fossa but I did not see any evidence of cellulitis, could just be that the wrap came down and rubbed in this area. He does not have an open area on the left leg however there is an area on the left dorsal foot at the base of the third toe We have been using silver alginate to all wound areas 3/10; he did not have an open area on his left leg last time he was here a week ago. T oday he arrives with a horizontal wound just below the tibial tuberosity and an area on the left lateral calf. He has intense erythema and tenderness in this area. The area is on the right lateral calf and right posterior calf  better than last week. We have been using silver alginate as usual 3/18 - Patient returns with 3 small open areas on left calf, and 1 small open area on right calf, the skin looks ok with no significant erythema, he continues the UNA boot on right and 4 layer compression on left. The right lateral calf wound is closed , the right posterior is small area. we will continue silver alginate to the areas. 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 He is been tolerating the dressing changes without complication. Fortunately she has made excellent improvement in regard to the overall status of his wounds. Unfortunately every time we cease wrapping him he ends up reopening in causing more significant issues at that point. Again I'm unsure of the best direction to take although I think the lymphedema clinic may be appropriate for him. 02/03/19 on evaluation today patient appears to be doing well in regard to the wounds that we saw him for last week unfortunately he has a new area on the proximal portion of his right medial/posterior lower extremity where the wrap somewhat slowed down and caused swelling and a blister to rub and open. Unfortunately this is the only opening that he has on either leg at this point. 02/17/19 on evaluation today patient's bilateral lower extremities appear to be doing well. He still completely healed in regard to the left lower extremity. In regard to the right lower extremity the area where the wrap and slid down and caused the blister still seems to  be slightly open although this is dramatically better than during the last evaluation two weeks ago. I'm very pleased with the way this stands overall. 03/03/19 on evaluation today patient appears to be doing well in regard to his right lower  extremity in general although he did have a new blister open this does not appear to be showing any evidence of active infection at this time. Fortunately there's No fevers, chills, nausea, or vomiting noted at this time. Overall I feel like he is making good progress it does feel like that the right leg will we perform the [employer] seems to do with a bit better than three layer wrap on the left which slid down on him. We may switch to doing bilateral in the book wraps. 5/4; I have not seen Mr. Whitfill in quite some time. According to our case manager he did not have an open wound on his left leg last week. He had 1 remaining wound on the right posterior medial calf. He arrives today with multiple openings on the left leg probably were blisters and/or wrap injuries from Unna boots. I do not think the Unna boot's will provide adequate compression on the left. I am also not clear about the frequency he is using the compression pumps. 03/17/19 on evaluation today patient appears to be doing excellent in regard to his lower extremities compared to last week's evaluation apparently. He had gotten significantly worse last week which is unfortunate. The [employer] wrap on the left did not seem to do very well for him at all and in fact it didn't control his swelling significantly enough he had an additional outbreak. Subsequently we go back to the four layer compression wrap on the left. This is good news. At least in that he is doing better and the wound seem to be killing him. He still has not heard anything from the lymphedema clinic. 03/24/19 on evaluation today patient actually appears to be doing much better in regard to his bilateral lower Trinity as compared to last week when I saw him. Fortunately there's no signs of active infection at this time. He has been tolerating the dressing changes without complication. Overall I'm extremely pleased with the progress and appearance in general. 04/07/19 on  evaluation today patient appears to be doing well in regard to his bilateral lower extremities. His swelling is significantly down from where it was previous. With that being said he does have a couple blisters still open at this point but fortunately nothing that seems to be too severe and again the majority of the larger openings has healed at this time. 04/14/19 on evaluation today patient actually appears to be doing quite well in regard to his bilateral lower extremities in fact I'm not even sure there's anything significantly open at this time at any site. Nonetheless he did have some trouble with these wraps where they are somewhat irritating him secondary to the fact that he has noted that the graph wasn't too close down to the end of this foot in a little bit short as well up to his knee. Otherwise things seem to be doing quite well. 04/21/19 upon evaluation today patient's wound bed actually showed evidence of being completely healed in regard to both lower extremities which is excellent news. There does not appear to be any signs of active infection which is also good news. I'm very pleased in this regard. No fevers, chills, nausea, or vomiting noted at this time. 04/28/19 on evaluation today  patient appears to be doing a little bit worse in regard to both lower extremities on the left mainly due to the fact that when he went infection disease the wrap was not wrapped quite high enough he developed a blister above this. On the right he is a small open area of nothing too significant but again this is continuing to give him some trouble he has been were in the Velcro compression that he has at home. 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 He's been tolerating the dressing changes without complication. Fortunately there's no signs of active infection at this time. No fevers, chills, nausea, or vomiting noted at this time. We have  been trying to get an appointment with her lymphedema clinic in [facility] but unfortunately nobody can get them on phone with not been able to even fax information over the patient likewise is not been able to get in touch with them. Overall I'm not sure exactly what's going on here with to reach out again today. 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 Still having a lot of drainage unfortunately. He tells me especially in the left but even on the right. There's no signs of active infection which is good news we've been using so ratcheted up to this point. 05/19/19 on evaluation today patient actually appears to be doing quite well with regard to his left lower extremity which is great news. Fortunately in regard to the right lower extremity has an issues with his wrap and he subsequently did remove this from what I'm understanding. Nonetheless long story short is what he had rewrapped once he removed it subsequently had maggots underneath this wrap whenever he came in for evaluation today. With that being said they were obviously completely cleaned away by the nursing staff. The visit today which is excellent news. However he does appear to potentially have some infection around the right ankle region where the maggots were located as well. He will likely require anabiotic therapy today. 05/26/19 on evaluation today patient actually appears to be doing much better in regard to his bilateral lower extremities. I feel like the infection is under much better control. With that being said there were maggots noted when the wrap was removed yet again today. Again this could have potentially been left over from previous although at this time there does not appear to be any signs of significant drainage there was obviously on the wrap some drainage as well this contracted gnats or otherwise. Either way I do  not see anything that appears to be doing worse in my pinion and in fact I think his drainage has slowed down quite significantly likely mainly due to the fact to his infection being under better control. 06/02/2019 on evaluation today patient actually appears to be doing well with regard to his bilateral lower extremities there is no signs of active infection at this time which is great news. With that being said he does have several open areas more so on the right than the left but nonetheless these are all significantly better than previously noted. 06/09/2019 on evaluation today patient actually appears to be doing well. His wrap stayed up and he did not cause any problems he had more drainage on the right compared to the left but overall I do not see any major issues at this time which  is great news. 06/16/2019 on evaluation today patient appears to be doing excellent with regard to his lower extremities the only area that is open is a new blister that can have opened as of today on the medial ankle on the left. Other than this he really seems to be doing great I see no major issues at this point. 06/23/2019 on evaluation today patient appears to be doing quite well with regard to his bilateral lower extremities. In fact he actually appears to be almost completely healed there is a small area of weeping noted of the right lower extremity just above the ankle. Nonetheless fortunately there is no signs of active infection at this time which is good news. No fevers, chills, nausea, vomiting, or diarrhea. 8/24; the patient arrived for a nurse visit today but complained of very significant pain in the left leg and therefore I was asked to look at this. Noted that he did not have an open area on the left leg last week nevertheless this was wrapped. The patient states that he is not been able to put his compression pumps on the left leg because of the discomfort. He has not been systemically unwell 06/30/2019  on evaluation today patient unfortunately despite being excellent last week is doing much worse with regard to his left lower extremity today. In fact he had to come in for a nurse on Monday where his left leg had to be rewrapped due to excessive weeping Dr. Leanord Hawking placed him on doxycycline at that point. Fortunately there is no signs of active infection Systemically at this time which is good news. 07/07/2019 in regard to the patient's wounds today he actually seems to be doing well with his right lower extremity there really is nothing open or draining at this point this is great news. Unfortunately the left lower extremity is given him additional trouble at this time. There does not appear to be any signs of active infection nonetheless he does have a lot of edema and swelling noted at this point as well as blistering all of which has led to a much more poor appearing leg at this time compared to where it was 2 weeks ago when it was almost completely healed. Obviously this is a little discouraging for the patient. He is try to contact the lymphedema clinic in [place] he has not been able to get through to them. 07/14/2019 on evaluation today patient actually appears to be doing slightly better with regard to his left lower extremity ulcers. Overall I do feel like at least at the top of the wrap that we have been placing this area has healed quite nicely and looks much better. The remainder of the leg is showing signs of improvement. Unfortunately in the thigh area he still has an open region on the left and again on the right he has been utilizing just a Band-Aid on an area that also opened on the thigh. Again this is an area that were not able to wrap although we did do an Ace wrap to provide some compression that something that obviously is a little less effective than the compression wraps we have been using on the lower portion of the leg. He does have an appointment with the lymphedema clinic  in [facility] on Friday. 07/21/2019 on evaluation today patient appears to be doing better with regard to his lower extremity ulcers. He has been tolerating the dressing changes without complication. Fortunately there is no signs of active infection at this  time. No fevers, chills, nausea, vomiting, or diarrhea. I did receive the paperwork from the physical therapist at the lymphedema clinic in [place]. Subsequently I signed off on that this morning and sent that back to him for further progression with the treatment plan. 07/28/2019 on evaluation today patient appears to be doing very well with regard to his right lower extremity where I do not see any open wounds at this point. Fortunately he is feeling great as far as that is concerned as well. In regard to the left lower extremity he has been having issues with still several areas of weeping and edema although the upper leg is doing better his lower leg still I think is going require the compression wrap at this time. No fevers, chills, nausea, vomiting, or diarrhea. 08/04/2019 on evaluation today patient unfortunately is having new wounds on the right lower extremity. Again we have been using Unna boot wrap on that side. We switched him to using his juxta lite wrap at home. With that being said he tells me he has been using it although his legs extremely swollen and to be honest really does not appear that he has been. I cannot know that for sure however. Nonetheless he has multiple new wounds on the right lower extremity at this time. Obviously we will have to see about getting this rewrapped for him today. 08/11/2019 on evaluation today patient appears to be doing fairly well with regard to his wounds. He has been tolerating the dressing changes including the compression wraps without complication. He still has a lot of edema in his upper thigh regions bilaterally he is supposed to be seeing the lymphedema clinic on the 15th of this month  once his wraps arrive for the upper part of his legs. 08/18/2019 on evaluation today patient appears to be doing well with regard to his bilateral lower extremities at this point. He has been tolerating the dressing changes without complication. Fortunately there is no signs of active infection which is also good news. He does have a couple weeping areas on the first and second toe of the right foot he also has just a small area on the left foot upper leg and a small area on the left lower leg but overall he is doing quite well in my opinion. He is supposed to be getting his wraps shortly in fact tomorrow and then subsequently is seeing the lymphedema clinic next Wednesday on the 21st. Of note he is also leaving on the 25th to go on vacation for a week to the beach. For that reason and since there is some uncertainty about what there can be doing at lymphedema clinic next Wednesday I am get a make an appointment for next Friday here for [place] to see what we need to do for him prior to him leaving for vacation. 10/23; patient arrives in considerable pain predominantly in the upper posterior calf just distal to the popliteal fossa also in the wound anteriorly above the major wound. This is probably cellulitis and he has had this recurrently in the past. He has no open wound on the right side and he has had an Radio broadcast assistant in that area. Finally I note that he has an area on the left posterior calf which by enlarge is mostly epithelialized. This protrudes beyond the borders of the surrounding skin in the setting of dry scaly skin and lymphedema. The patient is leaving for [facility] on Sunday. Per his longstanding pattern, he will not take  his compression pumps with him predominantly out of fear that they will be stolen. He therefore asked that we put a Unna boot back on the right leg. He will also contact the wound care center in [facility] to see if they can change his dressing in the mid week. 11/3;  patient returned from his vacation to [facility] He was seen on 1 occasion at their wound care center. They did a 2 layer compression system as they did not have our 4-layer wrap. I am not completely certain what they put on the wounds. They did not change the Unna boot on the right. The patient is also seeing a lymphedema specialist physical therapist in [place]. It appears that he has some compression sleeve for his thighs which indeed look quite a bit better than I am used to seeing. He pumps over these with his external compression pumps. 11/10; the patient has a new wound on the right medial thigh otherwise there is no open areas on the right. He has an area on the left leg posteriorly anteriorly and medially and an area over the left second toe. We have been using silver alginate. He thinks the injury on his thigh is secondary to friction from the compression sleeve he has. 11/17; the patient has a new wound on the right medial thigh last week. He thinks this is because he did not have a underlying stocking for his thigh juxta lite apparatus. He now has this. The area is fairly large and somewhat angry but I do not think he has underlying cellulitis. He has a intact blister on the right anterior tibial area. Small wound on the right great toe dorsally Small area on the medial left calf. 11/30; the patient does not have any open areas on his right leg and we did not take his juxta lite stocking off. However he states that on Friday his compression wrap fell down lodging around his upper mid calf area. As usual this creates a lot of problems for him. He called urgently today to be seen for a nurse visit however the nurse visit turned into a provider visit because of extreme erythema and pain in the left anterior tibia extending laterally and posteriorly. The area that is problematic is extensive 10/06/2019 upon evaluation today patient actually appears to be doing poorly in regard to his  left lower extremity. He Dr. Leanord Hawking did place him on doxycycline this past Monday apparently due to the fact that he was doing much worse in regard to this left leg. Fortunately the doxycycline does seem to be helping. Unfortunately we are still having a very difficult time getting his edema under any type of control in order to anticipate discharge at some point. The only way were really able to control his lymphedema really is with compression wraps and that has only even seemingly temporary. He has been seeing a lymphedema clinic they are trying to help in this regard but still this has been somewhat frustrating in general for the patient. 10/13/19 on evaluation today patient appears to be doing excellent with regard to his right lower extremity as far as the wounds are concerned. His swelling is still quite extensive unfortunately. He is still having a lot of drainage from the thigh areas bilaterally which is unfortunate. He's been going to lymphedema clinic but again he still really does not have this edema under control as far as his lower extremities are concern. With regard to his left lower extremity this seems  to be improving and I do believe the doxycycline has been of benefit for him. He is about to complete the doxycycline. 10/20/2019 on evaluation today patient appears to be doing poorly in regard to his bilateral lower extremities. More in the right thigh he has a lot of irritation at this site unfortunately. In regard to the left lower extremity the wrap was not quite as high it appears and does seem to have caused him some trouble as well. Fortunately there is no evidence of systemic infection though he does have some blue-green drainage which has me concerned for the possibility of Pseudomonas. He tells me he is previously taking Cipro without complications and he really does not care for Levaquin however due to some of the side effects he has. He is not allergic to any medications  specifically antibiotics that were aware of. 10/27/2019 on evaluation today patient actually does appear to be for the most part doing better when compared to last week's evaluation. With that being said he still has multiple open wounds over the bilateral lower extremities. He actually forgot to start taking the Cipro and states that he still has the whole bottle. He does have several new blisters on left lower extremity today I think I would recommend he go ahead and take the Cipro based on what I am seeing at this point. 12/30-Patient comes at 1 week visit, 4 layer compression wraps on the left and Unna boot on the right, primary dressing Xtrasorb and silver alginate. Patient is taking his Cipro and has a few more days left probably 5-6, and the legs are doing better. He states he is using his compressions devices which I believe he has 11/10/2019 on evaluation today patient actually appears to be much better than last time I saw him 2 weeks ago. His wounds are significantly improved and overall I am very pleased in this regard. Fortunately there is no signs of active infection at this time. He is just a couple of days away from completing Cipro. Overall his edema is much better he has been using his lymphedema pumps which I think is also helping at this point. 11/17/2019 on evaluation today patient appears to be doing excellent in regard to his wounds in general. His legs are swollen but not nearly as much as they have been in the past. Fortunately he is tolerating the compression wraps without complication. No fevers, chills, nausea, vomiting, or diarrhea. He does have some erythema however in the distal portion of his right lower extremity specifically around the forefoot and toes there is a little bit of warmth here as well. 11/24/2019 on evaluation today patient appears to be doing well with regard to his right lower extremity I really do not see any open wounds at this point. His left lower  extremity does have several open areas and his right medial thigh also is open. Other than this however overall the patient seems to be making good progress and I am very pleased at this point. 12/01/2019 on evaluation today patient appears to be doing poorly at this point in regard to his left lower extremity has several new blisters despite the fact that we have him in compression wraps. In fact he had a 4-layer compression wrap, his upper thigh wrapped from lymphedema clinic, and a juxta light over top of the 4 layer compression wrap the lymphedema clinic applied and despite all this he still develop blisters underneath. Obviously this does have me concerned about the fact that  unfortunately despite what we are doing to try to get wounds healed he continues to have new areas arise I do not think he is ever good to be at the point where he can realistically just use wraps at home to keep things under control. Typically when we heal him it takes about 1-2 days before he is back in the clinic with severe breakdown and blistering of his lower extremities bilaterally. This is happened numerous times in the past. Unfortunately I think that we may need some help as far as overall fluid overload to kind of limit what we are seeing and get things under better control. 12/08/2019 on evaluation today patient presents for follow-up concerning his ongoing bilateral lower extremity edema. Unfortunately he is still having quite a bit of swelling the compression wraps are controlling this to some degree but he did see Dr. [name] his cardiologist I do have that available for review today as far as the appointment was concerned that was on 12/06/2019. Obviously that she has been 2 days ago. The patient states that he is only been taking the Lasix 80 mg 1 time a day he had told me previously he was taking this twice a day. Nonetheless Dr. [name] recommended this be up to 80 mg 2 times a day for the patient as he did appear to  be fluid overloaded. With that being said the patient states he did this yesterday and he was unable to go anywhere or do anything due to the fact that he was constantly having to urinate. Nonetheless I think that this is still good to be something that is important for him as far as trying to get his edema under control at all things that he is going to be able to just expect his wounds to get under control and things to be better without going through at least a period of time where he is trying to stabilize his fluid management in general and I think increasing the Lasix is likely the first step here. It was also mentioned the possibility that the patient may require metolazone. With that being said he wanted to have the patient take Lasix twice a day first and then reevaluating 2 months to see where things stand. 12/15/2019 upon evaluation today patient appears to be doing regard to his legs although his toes are showing some signs of weeping especially on the left at this point to some degree on the right. There does not appear to be any signs of active infection and overall I do feel like the compression wraps are doing well for him but he has not been able to take the Lasix at home and the increased dose that Dr. [name] recommended. He tells me that just not go to be feasible for him. Nonetheless I think in this case he should probably send a message to Dr. [name] in order to discuss options from the standpoint of possible admission to get the fluid off or otherwise going forward. 12/22/2019 upon evaluation today patient appears to be doing fairly well with regard to his lower extremities at this point. In fact he would be doing excellent if it was not for the fact that his right anterior thigh apparently had an allergic reaction to adhesive tape that he used. The wound itself that we have been monitoring actually appears to be healed. There is a lot of irritation at this point. 12/29/2019 upon  evaluation today patient appears to be doing well in regard to his  lower extremities. His left medial thigh is open and somewhat draining today but this is the only region that is open the right has done much better with the treatment utilizing the steroid cream that I prescribed for him last week. Overall I am pleased in that regard. Fortunately there is no signs of active infection at this time. No fevers, chills, nausea, vomiting, or diarrhea. 01/05/2020 upon evaluation today patient appears to be doing more poorly in regard to his right lower extremity at this point upon evaluation today. Unfortunately he continues to have issues in this regard and I think the biggest issue is controlling his edema. This obviously is not very well controlled at this point is been recommended that he use the Lasix twice a day but he has not been able to do that. Unfortunately I think this is leading to an issue where honestly he is not really able to effectively control his edema and therefore the wounds really are not doing significantly better. I do not think that he is going to be able to keep things under good control unless he is able to control his edema much better. I discussed this again in great detail with him today. 01/12/2020 good news is patient actually appears to be doing quite well today at this point. He does have an appointment with lymphedema clinic tomorrow. His legs appear healed and the toe on the left is almost completely healed. In general I am very pleased with how things stand at this point. 01/19/2020 upon evaluation today patient appears to actually be doing well in regard to his lower extremities there is nothing open at this point. Fortunately he has done extremely well more recently. Has been seeing lymphedema clinic as well. With that being said he has Velcro wraps for his lower legs as well as his upper legs. The only wound really is on his toe which is the right great toe and this is  barely anything even there. With all that being said I think it is good to be appropriate today to go ahead and switch him over to the Velcro compression wraps. 01/26/2020 upon evaluation today patient appears to be doing worse with regard to his lower extremities after last week switch him to Velcro compression wraps. Unfortunately he lasted less than 24 hours he did not have the sock portion of his Velcro wrap on the left leg and subsequently developed a blister underneath the Velcro portion. Obviously this is not good and not what we were looking for at this point. He states the lymphedema clinic did tell him to wear the wrap for 23 hours and take him off for 1 I am okay with that plan but again right now we got a get things back under control again he may have some cellulitis noted as well. 02/02/2020 upon evaluation today patient unfortunately appears to have several areas of blistering on his bilateral lower extremities today mainly on the feet. His legs do seem to be doing somewhat better which is good news. Fortunately there is no evidence of active infection at this time. No fevers, chills, nausea, vomiting, or diarrhea. 02/16/2020 upon evaluation today patient appears to be doing well at this time with regard to his legs. He has a couple weeping areas on his toes but for the most part everything is doing better and does appear to be sealed up on his legs which is excellent news. We can continue with wrapping him at this point as he had every  time we discontinue the wraps he just breaks out with new wounds. There is really no point in is going forward with this at this point. 03/08/2020 upon evaluation today patient actually appears to be doing quite well with regard to his lower extremity ulcers. He has just a very superficial and really almost nonexistent blister on the left lower extremity he has in general done very well with the compression wraps. With that being said I do not see any signs  of infection at this time which is good news. 03/29/2020 upon evaluation today patient appears to be doing well with regard to his wounds currently except for where he had several new areas that opened up due to some of the wrap slipping and causing him trouble. He states he did not realize they had slipped. Nonetheless he has a 1 area on the right and 3 new areas on the left. Fortunately there is no signs of active infection at this time which is great news. 04/05/2020 upon evaluation today patient actually appears to be doing quite well in general in regard to his legs currently. Fortunately there is no signs of active infection at this time. No fevers, chills, nausea, vomiting, or diarrhea. He tells me next week that he will actually be seen in the lymphedema clinic on Thursday at 10 AM I see him on Wednesday next week. 04/12/2020 upon evaluation today patient appears to be doing very well with regard to his lower extremities bilaterally. In fact he does not appear to have any open wounds at this point which is good news. Fortunately there is no signs of active infection at this time. No fevers, chills, nausea, vomiting, or diarrhea. 04/19/2020 upon evaluation today patient appears to be doing well with regard to his wounds currently on the bilateral lower extremities. There does not appear to be any signs of active infection at this time. Fortunately there is no evidence of systemic infection and overall very pleased at this point. Nonetheless after I held him out last week he literally had blisters the next morning already which swelled up with him being right back here in the clinic. Overall I think that he is just not can be able to be discharged with his legs the way they are he is much to volume overloaded as far as fluid is concerned and that was discussed with him today of also discussed this but should try the clinic nurse manager as well as Dr. Leanord Hawkingobson. 04/26/2020 upon evaluation today patient  appears to be doing better with regard to his wounds currently. He is making some progress and overall swelling is under good control with the compression wraps. Fortunately there is no evidence of active infection at this time. 05/10/2020 on evaluation today patient appears to be doing overall well in regard to his lower extremities bilaterally. He is Tolerating the compression wraps without complication and with what we are seeing currently I feel like that he is making excellent progress. There is no signs of active infection at this time. 05/24/2020 upon evaluation today patient appears to be doing well in regard to his legs. The swelling is actually quite a bit down compared to where it has been in the past. Fortunately there is no sign of active infection at this time which is also good news. With that being said he does have several wounds on his toes that have opened up at this point. 05/31/2020 upon evaluation today patient appears to be doing well with regard to his  legs bilaterally where he really has no significant fluid buildup at this point overall he seems to be doing quite well. Very pleased in this regard. With regard to his toes these also seem to be drying up which is excellent. We have continue to wrap him as every time we tried as a transition to the juxta light wraps things just do not seem to get any better. 06/07/2020 upon evaluation today patient appears to be doing well with regard to his right leg at this point. Unfortunately left leg has a lot of blistering he tells me the wrap started to slide down on him when he tried to put his other Velcro wrap over top of it to help keep things in order but nonetheless still had some issues. 06/14/2020 on evaluation today patient appears to be doing well with regard to his lower extremity ulcers and foot ulcers at this point. I feel like everything is actually showing signs of improvement which is great news overall there is no signs of active  infection at this time. No fevers, chills, nausea, vomiting, or diarrhea. 06/21/2020 on evaluation today patient actually appears to be doing okay in regard to his wounds in general. With that being said the biggest issue I see is on his right foot in particular the first and second toe seem to be doing a little worse due to the fact this is staying very wet. I think he is probably getting need to change out his dressings a couple times in between each week when we see him in regard to his toes in order to keep this drier based on the location and how this is proceeding. 06/28/2020 on evaluation today patient appears to be doing a little bit more poorly overall in regard to the appearance of the skin I am actually somewhat concerned about the possibility of him having a little bit of an infection here. We discussed the course of potentially giving him a doxycycline prescription which he is taken previously with good result. With that being said I do believe that this is potentially mild and at this point easily fixed. I just do not want anything to get any worse. 07/12/2020 upon evaluation today patient actually appears to be making some progress with regard to his legs which is great news there does not appear to be any evidence of active infection. Overall very pleased with where things stand. 07/26/2020 upon evaluation today patient appears to be doing well with regard to his leg ulcers and toe ulcers at this point. He has been tolerating the compression wraps without complication overall very pleased in this regard. 08/09/2020 upon evaluation today patient appears to be doing well with regard to his lower extremities bilaterally. Fortunately there is no signs of active infection overall I am pleased with where things stand. 08/23/2020 on evaluation today patient appears to be doing well with regard to his wound. He has been tolerating the dressing changes without complication. Fortunately there is no  signs of active infection at this time. Overall his legs seem to be doing quite well which is great news and I am very pleased in that regard. No fevers, chills, nausea, vomiting, or diarrhea. 09/13/2020 upon evaluation today patient appears to be doing okay in regard to his lower extremities. He does have a fairly large blister on the right leg which I did remove the blister tissue from today so we can get this to dry out other than that however he seems to be doing  quite well. There is no signs of active infection at this time. 09/27/2020 upon evaluation today patient appears to actually be doing some better in regard to his right leg. Fortunately signs of active infection at this time which is great news. No fevers, chills, nausea, vomiting, or diarrhea. 10/04/2020 upon evaluation today patient actually appears to be showing signs of improvement which is great news with regard to his leg ulcers. Fortunately there is no signs of active infection which is great news he is still taking the antibiotics currently. No fevers, chills, nausea, vomiting, or diarrhea. 10/18/2020 on evaluation today patient appears to be doing well with regard to his legs currently. He has been tolerating the dressing changes including the wraps without complication. Fortunately there is no signs of active infection at this time. No fevers, chills, nausea, vomiting, or diarrhea. 10/25/2020 upon evaluation today patient appears to be doing decently well in regard to his wounds currently. He has been tolerating the dressing changes without complication. Overall I feel like he is making good progress albeit slow. Again this is something we can have to continue to wrap for some time to come most likely. 11/08/2020 upon evaluation today patient appears to be doing well with regard to his wounds currently. He has been tolerating the dressing changes without complication is not currently on any antibiotics and he does not appear to  show any signs of infection. He does continue to have a lot of drainage on the right leg not too severe but nonetheless this is very scattered. On the left leg this is looking to be much improved overall. 11/15/2020 upon evaluation today patient appears to be doing better with regard to his legs bilaterally. Especially the right leg which was much more significant last week. There does not appear to be any signs of active infection which is great news. No fevers, chills, nausea, vomiting, or diarrhea. 11/23/2019 upon evaluation today patient appears to be doing poorly still in regard to his lower extremities bilaterally. Unfortunately his right leg in particular appears to be doing much more poorly there is no signs really of infection this is not warm to touch but he does have a lot of drainage and weeping unfortunately. With that reason I do believe that we may need to initiate some treatment here to try to help calm down some of the swelling of the right leg. I think switching to a 4-layer compression wrap would be beneficial here. The patient is in agreement with giving this a try. 11/29/2020 upon evaluation today patient appears to be doing well currently in regard to his leg ulcers. I feel like the right leg is doing better he still has a lot of drainage but we do see some improvement here. The 4-layer compression wrap I think was helpful. 12/06/2020 upon evaluation today patient appears to be doing well with regard to his legs. In fact they seem to be doing about the best I have seen up to this point. Fortunately there is no signs of active infection at this time. No fevers, chills, nausea, vomiting, or diarrhea. 12/20/2020 upon evaluation today patient appears to be doing well at this time in regard to his legs. He is not having any significant draining which is great news. Fortunately there is no signs of active infection at this time. No fevers, chills, nausea, vomiting, or diarrhea. 01/17/2021 upon  evaluation today Alexander actually appears to be doing excellent in regard to his legs. He has a few areas again that  come and go as far as his toes are concerned but overall this is doing quite well. 01/31/2021 upon evaluation today patient appears to be doing well with regard to his legs. Fortunately there does not appear to be any signs of active infection which is great news. Overall he is still having significant edema despite the compression wraps basically the 4-layer compression wrap to just keep things under control there is really not much room for play. 4/13: Mr. Aber is a longstanding patient in our clinic and benefits greatly from weekly compression wraps. Today he has no complaints. He has been tolerating the wraps well. He states he is using the lymphedema pumps at home. 5/4; patient presents for follow-up of his chronic lymphedema/venous insufficiency ulcers. He comes weekly for compression wraps. He has no complaints today. He was unable to tolerate the Coflex 2 layer Last week so we will do the four press 4-layer compression. He has been using his lymphedema pumps daily. Electronic Signature(s) Signed: 03/07/2021 1:03:06 PM By: Geralyn Corwin DO Entered By: Geralyn Corwin on 03/07/2021 12:57:13 -------------------------------------------------------------------------------- Physical Exam Details Patient Name: Date of Service: [name], Blessed J. 03/07/2021 10:30 A M Medical Record Number: [id number] Patient Account Number: [id number] Date of Birth/Sex: Treating RN: 1951-04-16 (70 y.o. [name] Primary Care Provider: Nicoletta Ba Other Clinician: Referring Provider: Treating Provider/Extender: Debe Coder in Treatment: 267 Constitutional respirations regular, non-labored and within target range for patient.Marland Kitchen Psychiatric pleasant and cooperative. Notes Left lower extremity: Anterior aspect has a small wound limited to skin breakdown. The  lateral aspect has epithelialization to the previous wound site. The second left toe also has a wound limited to skin breakdown. No signs of infection. Electronic Signature(s) Signed: 03/07/2021 1:03:06 PM By: Geralyn Corwin DO Entered By: Geralyn Corwin on 03/07/2021 12:53:01 -------------------------------------------------------------------------------- Physician Orders Details Patient Name: Date of Service: [name], Jamaul J. 03/07/2021 10:30 A M Medical Record Number: [id number] Patient Account Number: [id number] Date of Birth/Sex: Treating RN: July 04, 1951 (70 y.o. [name] Primary Care Provider: Other Clinician: Nicoletta Ba Referring Provider: Treating Provider/Extender: Debe Coder in Treatment: [phone] Verbal / Phone Orders: No Diagnosis Coding ICD-10 Coding Code Description L97.521 Non-pressure chronic ulcer of other 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 of skin I87.333 Chronic venous hypertension (idiopathic) with ulcer and inflammation of bilateral lower extremity I89.0 Lymphedema, not elsewhere classified E11.622 Type 2 diabetes mellitus with other skin ulcer 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, unspecified Follow-up Appointments ppointment in 2 weeks. - with Dr. Mikey Bussing Return A Nurse Visit: - 1 week- Wed Bathing/ Shower/ Hygiene May shower with protection but do not get wound dressing(s) wet. Edema Control - Lymphedema / SCD / Other Bilateral Lower Extremities Lymphedema Pumps. Use Lymphedema pumps on leg(s) 2-3 times a day for 45-60 minutes. If wearing any wraps or hose, do not remove them. Continue exercising as instructed. Elevate legs to the level of the heart or above for 30 minutes daily and/or when sitting, a frequency of: - throughout the day Avoid standing for long periods of time. Exercise regularly Non Wound Condition Bilateral Lower Extremities Other Non Wound Condition Orders/Instructions: -  lotion to both legs, 4 layer compression wraps right legs Wound Treatment Wound #193 - T Second oe Wound Laterality: Left Prim Dressing: PolyMem Silver Non-Adhesive Dressing, 4.25x4.25 in Every Other Day/15 Days ary Discharge Instructions: Apply to wound bed as instructed Secondary Dressing: Woven Gauze Sponges 2x2 in Every Other Day/15 Days Discharge Instructions:  Apply over primary dressing as directed. Secured With: Insurance underwriter, Sterile 2x75 (in/in) Every Other Day/15 Days Discharge Instructions: Secure with stretch gauze as directed. Wound #195 - Lower Leg Wound Laterality: Left, Anterior Peri-Wound Care: Sween Lotion (Moisturizing lotion) 1 x Per Week/30 Days Discharge Instructions: Apply moisturizing lotion as directed Prim Dressing: KerraCel Ag Gelling Fiber Dressing, 4x5 in (silver alginate) 1 x Per Week/30 Days ary Discharge Instructions: Apply silver alginate to wound bed as instructed Secondary Dressing: ABD Pad, 5x9 1 x Per Week/30 Days Discharge Instructions: Apply over primary dressing as directed. Compression Wrap: FourPress (4 layer compression wrap) 1 x Per Week/30 Days Discharge Instructions: Apply four layer compression as directed. May also use [place] CoFlex 2 layer compression system as alternative. Electronic Signature(s) Signed: 03/07/2021 1:03:06 PM By: Geralyn Corwin DO Entered By: Geralyn Corwin on 03/07/2021 12:57:40 -------------------------------------------------------------------------------- Problem List Details Patient Name: Date of Service: [name], Lloyd J. 03/07/2021 10:30 A M Medical Record Number: [id number] Patient Account Number: [id number] Date of Birth/Sex: Treating RN: 1951/02/19 (70 y.o. 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 of 04/27/2018 No Yes skin I87.333 Chronic venous hypertension (idiopathic) with ulcer and inflammation of 01/22/2016 No Yes bilateral lower extremity I89.0 Lymphedema, not elsewhere classified 01/22/2016 No Yes E11.622 Type 2 diabetes mellitus with other skin ulcer 01/22/2016 No Yes 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, unspecified 01/22/2016 No Yes Inactive Problems ICD-10 Code Description Active Date Inactive Date L03.115 Cellulitis of right lower limb 12/22/2017 12/22/2017 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 06/30/2018 06/30/2018 L97.511 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 of skin 06/30/2018 06/30/2018 Resolved Problems ICD-10 Code Description Active Date Resolved Date L97.211 Non-pressure chronic ulcer of right calf limited to breakdown of skin 06/30/2018 06/30/2018 L97.221 Non-pressure chronic ulcer of left 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 03/07/2021 10:30 A M Medical Record Number: [id number] Patient Account Number: [id number] Date of Birth/Sex: Treating RN: [birth year] (70 y.o. [name] Primary Care Provider: Nicoletta Ba Other Clinician: Referring Provider: Treating Provider/Extender: Debe Coder in Treatment: 267 Subjective Chief Complaint Information obtained from  Patient patient is here for evaluation of venous/lymphedema ulcers History of Present Illness (HPI) Referred by PCP for consultation. Patient has long standing history of BLE venous stasis, no prior ulcerations. At beginning of month, developed cellulitis and weeping. Received IM Rocephin followed by Keflex and resolved. Wears compression stocking, appr 6 months old. Not sure strength. No present drainage. 01/22/16 this is a patient who is a type II diabetic on insulin. 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 He tells me he religiously wears pressure stockings of uncertain strength. He was here with weeping edema about 8 months ago but did not have an open wound. Roughly a month ago he had a reopening on his bilateral legs. He is been using bandages and Neosporin. He does not complain of pain.  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 Last BUN/creatinine I have is from 11/20/15 at 13 and 1.0 respectively 01/29/16; patient arrives today having tolerated the Profore wrap. He brought in his stockings and these are 18 mmHg stockings he bought from [place]. The compression here is likely inadequate. He does not complain of pain or excessive drainage she has no systemic symptoms. The wound on the right looks improved as does the one on the left although one on the left is more substantial with still tissue at risk below the actual wound area on the bilateral posterior calf 02/05/16; patient arrives with poor edema control. He states that we did put a 4 layer compression on it last week. No weight appear 5 this. 02/12/16; the area on the posterior right Has healed. The left Has a substantial wound that has necrotic surface eschar that requires a debridement with a curette. 02/16/16;the patient called or a Nurse visit secondary to increased swelling. He had been in  earlier in the week with his right leg healed. He was transitioned to is on pressure stocking on the right leg with the only open wound on the left, a substantial area on the left posterior calf. Note he has a history of severe lower extremity edema, he has a history of chronic atrial fibrillation but not heart failure per my notes but I'll need to research this. He is not complaining of chest pain shortness of breath or orthopnea. The intake nurse noted blisters on the previously closed right leg 02/19/16; this is the patient's regular visit day. I see him on Friday with escalating edema new wounds on the right leg and clear signs of at least right ventricular heart failure. I increased his Lasix to 40 twice a day. He is returning currently in follow-up. States he is noticed a decrease in that the edema 02/26/16 patient's legs have much less edema. There is nothing really open on the right leg. The left leg has improved condition of the large superficial wound on the posterior left leg 03/04/16; edema control is very much better. The patient's right leg wounds have healed. On the left leg he continues to have severe venous inflammation on the posterior aspect of the left leg. There is no tenderness and I don't think any of this is cellulitis. 03/11/16; patient's right leg is married healed and he is in his own stocking. The patient's left leg has deteriorated somewhat. There is a lot of erythema around the wound on the posterior left leg. There is also a significant rim of erythema posteriorly just above where the wrap would've ended there is a new wound in this location and a lot of tenderness. Can't rule out cellulitis in this area. 03/15/16; patient's right leg remains healed and he is in his own stocking. The patient's left leg is much better than last review. His major wound on the posterior aspect of his left Is almost fully epithelialized. He has 3 small injuries from the wraps. Really. Erythema seems  a lot better on antibiotics 03/18/16; right leg remains healed and he is in his own stocking. The patient's left leg is much better. The area on the posterior aspect of the left calf is fully epithelialized. His 3 small injuries which were wrap injuries on the left are improved only one seems still open his erythema has resolved 03/25/16; patient's right leg remains healed  and he is in his own stocking. There is no open area today on the left leg posterior leg is completely closed up. His wrap injuries at the superior aspect of his leg are also resolved. He looks as though he has some irritation on the dorsal ankle but this is fully epithelialized without evidence of infection. 03/28/16; we discharged this patient on Monday. Transitioned him into his own stocking. There were problems almost immediately with uncontrolled swelling weeping edema multiple some of which have opened. He does not feel systemically unwell in particular no chest pain no shortness of breath and he does not feel 04/08/16; the edema is under better control with the Profore light wrap but he still has pitting edema. There is one large wound anteriorly 2 on the medial aspect of his left leg and 3 small areas on the superior posterior calf. Drainage is not excessive he is tolerating a Profore light well 04/15/16; put a Profore wrap on him last week. This is controlled is edema however he had a lot of pain on his left anterior foot most of his wounds are healed 04/22/16 once again the patient has denuded areas on the left anterior foot which he states are because his wrap slips up word. He saw his primary physician today is on Lasix 40 twice a day and states that he his weight is down 20 pounds over the last 3 months. 04/29/16: Much improved. left anterior foot much improved. He is now on Lasix 80 mg per day. Much improved edema control 05/06/16; I was hoping to be able to discharge him today however once again he has blisters at a low level  of where the compression was placed last week mostly on his left lateral but also his left medial leg and a small area on the anterior part of the left foot. 05/09/16; apparently the patient went home after his appointment on 7/4 later in the evening developing pain in his upper medial thigh together with subjective fever and chills although his temperature was not taken. The pain was so intense he felt he would probably have to call 911. However he then remembered that he had leftover doxycycline from a previous round of antibiotics and took these. By the next morning he felt a lot better. He called and spoke to one of our nurses and I approved doxycycline over the phone thinking that this was in relation to the wounds we had previously seen although they were definitely were not. The patient feels a lot better old fever no chills he is still working. Blood sugars are reasonably controlled 05/13/16; patient is back in for review of his cellulitis on his anterior medial upper thigh. He is taking doxycycline this is a lot better. Culture I did of the nodular area on the dorsal aspect of his foot grew MRSA this also looks a lot better. 05/20/16; the patient is cellulitis on the medial upper thigh has resolved. All of his wound areas including the left anterior foot, areas on the medial aspect of the left calf and the lateral aspect of the calf at all resolved. He has a new blister on the left dorsal foot at the level of the fourth toe this was excised. No evidence of infection 05/27/16; patient continues to complain weeping edema. He has new blisterlike wounds on the left anterior lateral and posterior lateral calf at the top of his wrap levels. The area on his left anterior foot appears better. He is not complaining of fever,  pain or pruritus in his feet. 05/30/16; the patient's blisters on his left anterior leg posterior calf all look improved. He did not increase the Lasix 100 mg as I suggested because he  was going to run out of his 40 mg tablets. He is still having weeping edema of his toes 06/03/16; I renewed his Lasix at 80 mg once a day as he was about to run out when I last saw him. He is on 80 mg of Lasix now. I have asked him to cut down on the excessive amount of water he was drinking and asked him to drink according to his thirst mechanisms 06/12/2016 -- was seen 2 days ago and was supposed to wear his compression stockings at home but he is developed lymphedema and superficial blisters on the left lower extremity and hence came in for a review 06/24/16; the remaining wound is on his left anterior leg. He still has edema coming from between his toes. There is lymphedema here however his edema is generally better than when I last saw this. He has a history of atrial fibrillation but does not have a known history of congestive heart failure nevertheless I think he probably has this at least on a diastolic basis. 07/01/16 I reviewed his echocardiogram from January 2017. This was essentially normal. He did not have LVH, EF of 55-60%. His right ventricular function was normal although he did have trivial tricuspid and pulmonic regurgitation. This is not audible on exam however. I increased his Lasix to do massive edema in his legs well above his knees I think in early July. He was also drinking an excessive amount of water at the time. 07/15/16; missed his appointment last week because of the Labor Day holiday on Monday. He could not get another appointment later in the week. Started to feel the wrap digging in superiorly so we remove the top half and the bottom half of his wrap. He has extensive erythema and blistering superiorly in the left leg. Very tender. Very swollen. Edema in his foot with leaking edema fluid. He has not been systemically unwell 07/22/16; the area on the left leg laterally required some debridement. The medial wounds look more stable. His wrap injury wounds appear to have  healed. Edema and his foot is better, weeping edema is also better. He tells me he is meeting with the supplier of the external compression pumps at work 08/05/16; the patient was on vacation last week in [facility]. His wrap is been on for an extended period of time. Also over the weekend he developed an extensive area of tender erythema across his anterior medial thigh. He took to doxycycline yesterday that he had leftover from a previous prescription. The patient complains of weeping edema coming out of his toes 08/08/16; I saw this patient on 10/2. He was tender across his anterior thigh. I put him on doxycycline. He returns today in follow-up. He does not have any open wounds on his lower leg, he still has edema weeping into his toes. 08/12/16; patient was seen back urgently today to follow-up for his extensive left thigh cellulitis/erysipelas. He comes back with a lot less swelling and erythema pain is much better. I believe I gave him Augmentin and Cipro. His wrap was cut down as he stated a roll down his legs. He developed blistering above the level of the wrap that remained. He has 2 open blisters and 1 intact. 08/19/16; patient is been doing his primary doctor who is increased  his Lasix from 40-80 once a day or 80 already has less edema. Cellulitis has remained improved in the left thigh. 2 open areas on the posterior left calf 08/26/16; he returns today having new open blisters on the anterior part of his left leg. He has his compression pumps but is not yet been shown how to use some vital representative from the supplier. 09/02/16 patient returns today with no open wounds on the left leg. Some maceration in his plantar toes 09/10/2016 -- Dr. Leanord Hawking had recently discharged him on 09/02/2016 and he has come right back with redness swelling and some open ulcers on his left lower extremity. He says this was caused by trying to apply his compression stockings and he's been unable to use this  and has not been able to use his lymphedema pumps. He had some doxycycline leftover and he has started on this a few days ago. 09/16/16; there are no open wounds on his leg on the left and no evidence of cellulitis. 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 He does not complain of symptoms here. I am not clear use using his external compression pumps. 09/23/16; I have not seen this patient in 2 weeks. He canceled his appointment 10 days ago as he was going on vacation. He tells me that on Monday he noticed a large area on his posterior left leg which is been draining copiously and is reopened into a large wound. He is been using ABDs and the external part of his juxtalite, according to our nurse this was not on properly. 10/07/16; Still a substantial area on the posterior left leg. Using silver alginate 10/14/16; in general better although there is still open area which looks healthy. Still using silver alginate. He reminds me that this happen before he left for [facility]. T oday while he was showering in the morning. He had been using his juxtalite's 10/21/16; the area on his posterior left leg is fully epithelialized. However he arrives today with a large area of tender erythema in his medial and posterior left thigh just above the knee. I have marked the area. Once again he is reluctant to consider hospitalization. I treated him with oral antibiotics in the past for a similar situation with resolution I think with doxycycline however this area it seems more extensive to me. He is not complaining of fever but does have chills and says states he is thirsty. His blood sugar today was in the 140s at home 10/25/16 the area on his posterior left leg is fully epithelialized although there is still some weeping edema. The large area of tenderness and erythema in his medial and posterior left thigh is a lot less tender although there is still a lot  of swelling in this thigh. He states he feels a lot better. He is on doxycycline and Augmentin that I started last week. This will continued until Tuesday, December 26. I have ordered a duplex ultrasound of the left thigh rule out DVT whether there is an abscess something that would need to be drained I would also like to know. 11/01/16; he still has weeping edema from a not fully epithelialized area on his left posterior calf. Most of the rest of this looks a lot better. He has completed his antibiotics. His thigh is a lot better. Duplex ultrasound did not show a DVT in the thigh 11/08/16; he comes in today with more Denuded surface epithelium from the  posterior aspect of his calf. There is no real evidence of cellulitis. The superior aspect of his wrap appears to have put quite an indentation in his leg just below the knee and this may have contributed. He does not complain of pain or fever. We have been using silver alginate as the primary dressing. The area of cellulitis in the right thigh has totally resolved. He has been using his compression stockings once a week 11/15/16; the patient arrives today with more loss of epithelium from the posterior aspect of his left calf. He now has a fairly substantial wound in this area. The reason behind this deterioration isn't exactly clear although his edema is not well controlled. He states he feels he is generally more swollen systemically. He is not complaining of chest pain shortness of breath fever. T me he has an appointment with his primary physician in early February. He is on 80 mg of oral ells Lasix a day. He claims compliance with the external compression pumps. He is not having any pain in his legs similar to what he has with his recurrent cellulitis 11/22/16; the patient arrives a follow-up of his large area on his left lateral calf. This looks somewhat better today. He came in earlier in the week for a dressing change since I saw him a week ago.  He is not complaining of any pain no shortness of breath no chest pain 11/28/16; the patient arrives for follow-up of his large area on the left lateral calf this does not look better. In fact it is larger weeping edema. The surface of the wound does not look too bad. We have been using silver alginate although I'm not certain that this is a dressing issue. 12/05/16; again the patient follows up for a large wound on the left lateral and left posterior calf this does not look better. There continues to be weeping edema necrotic surface tissue. More worrisome than this once again there is erythema below the wound involving the distal Achilles and heel suggestive of cellulitis. He is on his feet working most of the day of this is not going well. We are changing his dressing twice a week to facilitate the drainage. 12/12/16; not much change in the overall dimensions of the large area on the left posterior calf. This is very inflamed looking. I gave him an. Doxycycline last week does not really seem to have helped. He found the wrap very painful indeed it seems to of dog into his legs superiorly and perhaps around the heel. He came in early today because the drainage had soaked through his dressings. 12/19/16- patient arrives for follow-up evaluation of his left lower extremity ulcers. He states that he is using his lymphedema pumps once daily when there is "no drainage". He admits to not using his lipedema pumps while under current treatment. His blood sugars have been consistently between 150-200. 12/26/16; the patient is not using his compression pumps at home because of the wetness on his feet. I've advised him that I think it's important for him to use this daily. He finds his feet too wet, he can put a plastic bag over his legs while he is in the pumps. Otherwise I think will be in a vicious circle. We are using silver alginate to the major area on his left posterior calf 01/02/17; the patient's posterior left  leg has further of all into 3 open wounds. All of them covered with a necrotic surface. He claims to be using his  compression pumps once a day. His edema control is marginal. Continue with silver alginate 01/10/17; the patient's left posterior leg actually looks somewhat better. There is less edema, less erythema. Still has 3 open areas covered with a necrotic surface requiring debridement. He claims to be using his compression pumps once a day his edema control is better 01/17/17; the patient's left posterior calf look better last week when I saw him and his wrap was changed 2 days ago. He has noted increasing pain in the left heel and arrives today with much larger wounds extensive erythema extending down into the entire heel area especially tender medially. He is not systemically unwell CBGs have been controlled no fever. Our intake nurse showed me limegreen drainage on his AVD pads. 01/24/17; his usual this patient responds nicely to antibiotics last week giving him Levaquin for presumed Pseudomonas. The whole entire posterior part of his leg is much better much less inflamed and in the case of his Achilles heel area much less tender. He has also had some epithelialization posteriorly there are still open areas here and still draining but overall considerably better 01/31/17- He has continue to tolerate the compression wraps. he states that he continues to use the lymphedema pumps daily, and can increase to twice daily on the weekends. He is voicing no complaints or concerns regarding his LLE ulcers 02/07/17-he is here for follow-up evaluation. He states that he noted some erythema to the left medial and anterior thigh, which he states is new as of yesterday. He is concerned about recurrent cellulitis. He states his blood sugars have been slightly elevated, this morning in the 180s 02/14/17; he is here for follow-up evaluation. When he was last here there was erythema superiorly from his posterior wound in  his anterior thigh. He was prescribed Levaquin however a culture of the wound surface grew MRSA over the phone I changed him to doxycycline on Monday and things seem to be a lot better. 02/24/17; patient missed his appointment on Friday therefore we changed his nurse visit into a physician visit today. Still using silver alginate on the large area of the posterior left thigh. He isn't new area on the dorsal left second toe 03/03/17; actually better today although he admits he has not used his external compression pumps in the last 2 days or so because of work responsibilities over the weekend. 03/10/17; continued improvement. External compression pumps once a day almost all of his wounds have closed on the posterior left calf. Better edema control 03/17/17; in general improved. He still has 3 small open areas on the lateral aspect of his left leg however most of the area on the posterior part of his leg is epithelialized. He has better edema control. He has an ABD pad under his stocking on the right anterior lower leg although he did not let us look at that today. 03/24/17; patient arrives back in clinic today with no open areas however there are areas on the posterior left calf and anterior left calf that are less than 100% epithelialized. His edema is well controlled in the left lower leg. There is some pitting edema probably lymphedema in the left upper thigh. He uses compression pumps at home once per day. I tried to get him to do this twice a day although he is very reticent. 04/01/2017 -- for the last 2 days he's had significant redness, tenderness and weeping and came in for an urgent visit today. 04/07/17; patient still has 6 more days of  doxycycline. He was seen by Dr. Meyer Russel last Wednesday for cellulitis involving the posterior aspect, lateral aspect of his Involving his heel. For the most part he is better there is less erythema and less weeping. He has been on his feet for 12 hours o2 over the  weekend. Using his compression pumps once a day 04/14/17 arrives today with continued improvement. Only one area on the posterior left calf that is not fully epithelialized. He has intense bilateral venous inflammation associated with his chronic venous insufficiency disease and secondary lymphedema. We have been using silver alginate to the left posterior calf wound In passing he tells [place] today that the right leg but we have not seen in quite some time has an open area on it but he doesn't want [place] to look at this today states he will show this to [place] next week. 04/21/17; there is no open area on his left leg although he still reports some weeping edema. He showed [place] his right leg today which is the first time we've seen this leg in a long time. He has a large area of open wound on the right leg anteriorly healthy granulation. Quite a bit of swelling in the right leg and some degree of venous inflammation. He told us about the right leg in passing last week but states that deterioration in the right leg really only happened over the weekend 04/28/17; there is no open area on the left leg although there is an irritated part on the posterior which is like a wrap injury. The wound on the right leg which was new from last week at least to [place] is a lot better. 05/05/17; still no open area on the left leg. Patient is using his new compression stocking which seems to be doing a good job of controlling the edema. He states he is using his compression pumps once per day. The right leg still has an open wound although it is better in terms of surface area. Required debridement. A lot of pain in the posterior right Achilles marked tenderness. Usually this type of presentation this patient gives concern for an active cellulitis 05/12/17; patient arrives today with his major wound from last week on the right lateral leg somewhat better. Still requiring debridement. He was using his compression stocking on the left leg  however that is reopened with superficial wounds anteriorly he did not have an open wound on this leg previously. He is still using his juxta light's once daily at night. He cannot find the time to do this in the morning as he has to be at work by 7 AM 05/19/17; right lateral leg wound looks improved. No debridement required. The concerning area is on the left posterior leg which appears to almost have a subcutaneous hemorrhagic component to it. We've been using silver alginate to all the wounds 05/26/17; the right lateral leg wound continues to look improved. However the area on the left posterior calf is a tightly adherent surface. [place] using silver alginate. Because of the weeping edema in his legs there is very little good alternatives. 06/02/17; the patient left here last week looking quite good. Major wound on the left posterior calf and a small one on the right lateral calf. Both of these look satisfactory. He tells me that by Wednesday he had noted increased pain in the left leg and drainage. He called on Thursday and Friday to get an appointment here but we were blocked. He did not go to urgent care  or his primary physician. He thinks he had a fever on Thursday but did not actually take his temperature. He has not been using his compression pumps on the left leg because of pain. I advised him to go to the emergency room today for IV antibiotics for stents of left leg cellulitis but he has refused I have asked him to take 2 days off work to keep his leg elevated and he has refused this as well. In view of this I'm going to call him and Augmentin and doxycycline. 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 However after a detailed discussion regarding the  risk benefits and alternatives he refuses to get admitted to the hospital. With no other recourse I will continue him on oral antibiotics as before and hopefully he'll have his infectious disease consultation this week. 06/16/2017 -- the patient was seen today by the nurse practitioner at infectious disease Ms. Dixon. 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 He has also been advise Lotrimin cream locally. she also had wise IV antibiotics for his condition if it flares up 06/23/17; patient arrives today with drainage bilaterally although the remaining wound on the left posterior calf after cleaning up today "highlighter yellow drainage" did not look too bad. Unfortunately he has had breakdown on the right anterior leg [previously this leg had not been open and he is using a black stocking] he went to see infectious disease and is been put on clindamycin 150 mg daily, I did not verify the dose although I'm not familiar with using clindamycin in this dosing range, perhaps for prophylaxisoo 06/27/17; I brought this patient back today to follow-up on the wound deterioration on the right lower leg together with surrounding cellulitis. I started him on doxycycline 4 days ago. This area looks better however he comes in today with intense cellulitis on the medial part of his left thigh. This is not have a wound in this area. Extremely tender. We've been using silver alginate to the wounds on the right lower leg left lower leg with bilateral 4 layer compression he is using his external compression pumps once a day 07/04/17; patient's left medial thigh cellulitis looks better. He has not been using his compression pumps as his insert said it was contraindicated with cellulitis. His right leg continues to make improvements all the  wounds are still open. We only have one remaining wound on the left posterior calf. Using silver alginate to all open areas. He is on doxycycline which I started a week ago and should be finishing I gave him Augmentin after Thursday's visit for the severe cellulitis on the left medial thigh which fortunately looks better 07/14/17; the patient's left medial thigh cellulitis has resolved. The cellulitis in his right lower calf on the right also looks better. All of his wounds are stable to improved we've been using silver alginate he has completed the antibiotics I have given him. He has clindamycin 150 mg once a day prescribed by infectious disease for prophylaxis, I've advised him to start this now. We have been  using bilateral Unna boots over silver alginate to the wound areas 07/21/17; the patient is been to see infectious disease who noted his recurrent problems with cellulitis. He was not able to tolerate prophylactic clindamycin therefore he is on amoxicillin 500 twice a day. He also had a second daily dose of Lasix added By Dr. Oneta Rack but he is not taking this. Nor is he being completely compliant with his compression pumps a especially not this week. He has 2 remaining wounds one on the right posterior lateral lower leg and one on the left posterior medial lower leg. 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 The patient has Unna boots bilaterally. 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 He has bilateral Unna boots for compression and silver alginate to his wounds. Arrives today with his legs looking as good as I have seen him in quite some time. Not surprisingly his wounds look better as well with improvement on the right lateral leg venous insufficiency wound and also  the left medial leg. He is still using the compression pumps once a day 08/11/17; both legs appear to be doing better wounds on the right lateral and left medial legs look better. Skin on the right leg quite good. He is been using silver alginate as the primary dressing. I'm going to use Anasept gel calcium alginate and maintain all the secondary dressings 08/18/17; the patient continues to actually do quite well. The area on his right lateral leg is just about closed the left medial also looks better although it is still moist in this area. His edema is well controlled we have been using Anasept gel with calcium alginate and the usual secondary dressings, 4 layer compression and once daily use of his compression pumps "always been able to manage 09/01/17; the patient continues to do reasonably well in spite of his trip to [place]. The area on the right lateral leg is epithelialized. Left is much better but still open. He has more edema and more chronic erythema on the left leg [venous inflammation] 09/08/17; he arrives today with no open wound on the right lateral leg and decently controlled edema. Unfortunately his left leg is not nearly as in his good situation as last week.he apparently had increasing edema starting on Saturday. He edema soaked through into his foot so used a plastic bag to walk around his home. The area on the medial right leg which was his open area is about the same however he has lost surface epithelium on the left lateral which is new and he has significant pain in the Achilles area of the left foot. He is already on amoxicillin chronically for prophylaxis of cellulitis in the left leg 09/15/17; he is completed a week of doxycycline and the cellulitis in the left posterior leg and Achilles area is as usual improved. He still has a lot of edema and fluid soaking through his dressings. There is no open wound on the right leg. He saw infectious disease NP today 09/22/17;As usual  1 we transition him from our compression wraps to his stockings things did not go well. He has several small open areas on the right leg. He states this was caused by the compression wrap on his skin although he did not wear this with the stockings  over them. He has several superficial areas on the left leg medially laterally posteriorly. He does not have any evidence of active cellulitis especially involving the left Achilles The patient is traveling from [facility] Saturday going to [facility]. He states he isn't attempting to get an appointment with a heel objects wound center there to change his dressings. I am not completely certain whether this will work 10/06/17; the patient came in on Friday for a nurse visit and the nurse reported that his legs actually look quite good. He arrives in clinic today for his regular follow-up visit. He has a new wound on his left third toe over the PIP probably caused by friction with his footwear. He has small areas on the left leg and a very superficial but epithelialized area on the right anterior lateral lower leg. Other than that his legs look as good as I've seen him in quite some time. We have been using silver alginate Review of systems; no chest pain no shortness of breath other than this a 10 point review of systems negative 10/20/17; seen by Dr. Meyer Russel last week. He had taken some antibiotics [doxycycline] that he had left over. Dr. Meyer Russel thought he had candida infection and declined to give him further antibiotics. He has a small wound remaining on the right lateral leg several areas on the left leg including a larger area on the left posterior several left medial and anterior and a small wound on the left lateral. The area on the left dorsal third toe looks a lot better. ROS; Gen.; no fever, respiratory no cough no sputum Cardiac no chest pain other than this 10 point review of system is negative 10/30/17; patient arrives today having fallen in the  bathtub 3 days ago. It took him a while to get up. He has pain and maceration in the wounds on his left leg which have deteriorated. He has not been using his pumps he also has some maceration on the right lateral leg. 11/03/17; patient continues to have weeping edema especially in the left leg. This saturates his dressings which were just put on on 12/27. As usual the doxycycline seems to take care of the cellulitis on his lower leg. He is not complaining of fever, chills, or other systemic symptoms. He states his leg feels a lot better on the doxycycline I gave him empirically. He also apparently gets injections at his primary doctor's officeo Rocephin for cellulitis prophylaxis. I didn't ask him about his compression pump compliance today I think that's probably marginal. Arrives in the clinic with all of his dressings primary and secondary macerated full of fluid and he has bilateral edema 11/10/17; the patient's right leg looks some better although there is still a cluster of wounds on the right lateral. The left leg is inflamed with almost circumferential skin loss medially to laterally although we are still maintaining anteriorly. He does not have overt cellulitis there is a lot of drainage. He is not using compression pumps. We have been using silver alginate to the wound areas, there are not a lot of options here 11/17/17; the patient's right leg continues to be stable although there is still open wounds, better than last week. The inflammation in the left leg is better. Still loss of surface layer epithelium especially posteriorly. There is no overt cellulitis in the amount of edema and his left leg is really quite good, tells me he is using his compression pumps once a day. 11/24/17; patient's right leg has  a small superficial wound laterally this continues to improve. The inflammation in the left leg is still improving however we have continuous surface layer epithelial loss posteriorly. There  is no overt cellulitis in the amount of edema in both legs is really quite good. He states he is using his compression pumps on the left leg once a day for 5 out of 7 days 12/01/17; very small superficial areas on the right lateral leg continue to improve. Edema control in both legs is better today. He has continued loss of surface epithelialization and left posterior calf although I think this is better. We have been using silver alginate with large number of absorptive secondary dressings 4 layer on the left Unna boot on the right at his request. He tells me he is using his compression pumps once a day 12/08/17; he has no open area on the right leg is edema control is good here. ooOn the left leg however he has marked erythema and tenderness breakdown of skin. He has what appears to be a wrap injury just distal to the popliteal fossa. 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 There was swelling erythema and breakdown in the right leg. He has much worse skin breakdown in the left leg as well multiple open areas medially and posteriorly as well as laterally. He tells me he has been using his compression pumps but tells me he feels that the drainage out of his leg is worse when he uses a compression pumps. T be fair to him he is been saying this o for a while however I don't know that I have really been listening to this. I wonder if the compression pumps are working properly 12/22/17;. Once again he arrives with severe erythema, weeping edema from the left greater than right leg. Noncompliance with compression pumps. New this visit he is complaining of pain on the lateral aspect of the right leg and the medial aspect of his  right thigh. He apparently saw his cardiologist Dr. [name] who was ordered an echocardiogram area and I think this is a step in the right direction 12/25/17; started his doxycycline Monday night. There is still intense erythema of the right leg especially in the anterior thigh although there is less tenderness. The erythema around the wound on the right lateral calf also is less tender. He still complaining of pain in the left heel. His wounds are about the same right lateral left medial left lateral. Superficial but certainly not close to closure. He denies being systemically unwell no fever chills no abdominal pain no diarrhea 12/29/17; back in follow-up of his extensive right calf and right thigh cellulitis. I added amoxicillin to cover possible doxycycline resistant strep. This seems to of done the trick he is in much less pain there is much less erythema and swelling. He has his echocardiogram at 11:00 this morning. X-ray of the left heel was also negative. 01/05/18; the patient arrived with his edema under much better control. Now that he is retired he is able to use his compression pumps daily and sometimes twice a day per the patient. He has a wound on the right leg the lateral wound looks better. Area on the left leg also looks a  lot better. He has no evidence of cellulitis in his bilateral thighs I had a quick peak at his echocardiogram. He is in normal ejection fraction and normal left ventricular function. He has moderate pulmonary hypertension moderately reduced right ventricular function. One would have to wonder about chronic sleep apnea although he says he doesn't snore. He'll review the echocardiogram with his cardiologist. 01/12/18; the patient arrives with the edema in both legs under exemplary control. He is using his compression pumps daily and sometimes twice daily. His wound on the right lateral leg is just about closed. He still has some weeping areas on the posterior left calf and  lateral left calf although everything is just about closed here as well. I have spoken with Aldean Baker who is the patient's nurse practitioner and infectious disease. She was concerned that the patient had not understood that the parenteral penicillin injections he was receiving for cellulitis prophylaxis was actually benefiting him. I don't think the patient actually saw that I would tend to agree we were certainly dealing with less infections although he had a serious one last month. 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 He is healed. He'll be placed in juxtalite compression wraps and increase his lymphedema pumps to twice daily. We will follow up again next week to ensure there are no issues with the new regiment. 01/20/18-he is here for evaluation of bilateral lower extremity weeping edema. Yesterday he was placed in compression wrap to the right lower extremity and compression stocking to left lower shrubbery. He states he uses lymphedema pumps last night and again this morning and noted a blister to the left lower extremity. On exam he was noted to have drainage to the right lower extremity. He will be placed in Unna boots bilaterally and follow-up next week 01/26/18; patient was actually discharged a week ago to his own juxta light stockings only to return the next day with bilateral lower extremity weeping edema.he was placed in bilateral Unna boots. He arrives today with pain in the back of his left leg. There is no open area on the right leg however there is a linear/wrap injury on the left leg and weeping edema on the left leg posteriorly. I spoke with infectious disease about 10 days ago. They were disappointed that the patient elected to discontinue prophylactic intramuscular penicillin shots as they felt it was particularly beneficial in reducing the frequency of his cellulitis. I discussed this with the patient today. He does not share this  view. He'll definitely need antibiotics today. Finally he is traveling to [place] and trauma leaving this Saturday and returning a week later and he does not travel with his pumps. He is going by car 01/30/18; patient was seen 4 days ago and brought back in today for review of cellulitis in the left leg posteriorly. I put him on amoxicillin this really hasn't helped as much as I might like. He is also worried because he is traveling to [facility] by car. Finally we will be rewrapping him. There is no open area on the right leg over his left leg has multiple weeping areas as usual 02/09/18; The same wrap on for 10 days. He did not pick up the last doxycycline I prescribed for him. He apparently took 4 days worth he already had. There is nothing open on his right leg and the edema control is really quite good. He's had damage in the left leg medially and laterally especially probably related  to the prolonged use of Unna boots 02/12/18; the patient arrived in clinic today for a nurse visit/wrap change. He complained of a lot of pain in the left posterior calf. He is taking doxycycline that I previously prescribed for him. Unfortunately even though he used his stockings and apparently used to compression pumps twice a day he has weeping edema coming out of the lateral part of his right leg. This is coming from the lower anterior lateral skin area. 02/16/18; the patient has finished his doxycycline and will finish the amoxicillin 2 days. The area of cellulitis in the left calf posteriorly has resolved. He is no longer having any pain. He tells me he is using his compression pumps at least once a day sometimes twice. 02/23/18; the patient finished his doxycycline and Amoxil last week. On Friday he noticed a small erythematous circle about the size of a quarter on the left lower leg just above his ankle. This rapidly expanded and he now has erythema on the lateral and posterior part of the thigh. This is  bright red. Also has an area on the dorsal foot just above his toes and a tender area just below the left popliteal fossa. He came off his prophylactic penicillin injections at his own insistence one or 2 months ago. This is obviously deteriorated since then 03/02/18; patient is on doxycycline and Amoxil. Culture I did last week of the weeping area on the back of his left calf grew group B strep. I have therefore renewed the amoxicillin 500 3 times a day for a further week. He has not been systemically unwell. Still complaining of an area of discomfort right under his left popliteal fossa. There is no open wound on the right leg. He tells me that he is using his pumps twice a day on most days 03/09/18; patient arrives in clinic today completing his amoxicillin today. The cellulitis on his left leg is better. Furthermore he tells me that he had intramuscular penicillin shots that his primary care office today. However he also states that the wrap on his right leg fell down shortly after leaving clinic last week. He developed a large blister that was present when he came in for a nurse visit later in the week and then he developed intense discomfort around this area.He tells me he is using his compression pumps 03/16/18; the patient has completed his doxycycline. The infectious part of this/cellulitis in the left heel area left popliteal area is a lot better. He has 2 open areas on the right calf. Still areas on the left calf but this is a lot better as well. 03/24/18; the patient arrives complaining of pain in the left popliteal area again. He thinks some of this is wrap injury. He has no open area on the right leg and really no open area on the left calf either except for the popliteal area. He claims to be compliant with the compression pumps 03/31/18; I gave him doxycycline last week because of cellulitis in the left popliteal area. This is a lot better although the surface epithelium is denuded off  and response to this. He arrives today with uncontrolled edema in the right calf area as well as a fingernail injury in the right lateral calf. There is only a few open areas on the left 04/06/18; I gave him amoxicillin doxycycline over the last 2 weeks that the amoxicillin should be completing currently. He is not complaining of any pain or systemic symptoms. The only open areas  see has is on the right lateral lower leg paradoxically I cannot see anything on the left lower leg. He tells me he is using his compression pumps twice a day on most days. Silver alginate to the wounds that are open under 4 layer compression 04/13/18; he completed antibiotics and has no new complaints. Using his compression pumps. Silver alginate that anything that's opened 04/20/18; he is using his compression pumps religiously. Silver alginate 4 layer compression anything that's opened. He comes in today with no open wounds on the left leg but 3 on the right including a new one posteriorly. He has 2 on the right lateral and one on the right posterior. He likes Unna boots on the right leg for reasons that aren't really clear we had the usual 4 layer compression on the left. It may be necessary to move to the 4 layer compression on the right however for now I left them in the Unna boots 04/27/18; he is using his compression pumps at least once a day. He has still the wounds on the right lateral calf. The area right posteriorly has closed. He does not have an open wound on the left under 4 layer compression however on the dorsal left foot just proximal to the toes and the left third toe 2 small open areas were identified 05/11/18; he has not uses compression pumps. The areas on the right lateral calf have coalesced into one large wound necrotic surface. On the left side he has one small wound anteriorly however the edema is now weeping out of a large part of his left leg. He says he wasn't using his pumps because of the  weeping fluid. I explained to him that this is the time he needs to pump more 05/18/18; patient states he is using his compression pumps twice a day. The area on the right lateral large wound albeit superficial. On the left side he has innumerable number of small new wounds on the left calf particularly laterally but several anteriorly and medially. All these appear to have healthy granulated base these look like the remnants of blisters however they occurred under compression. The patient arrives in clinic today with his legs somewhat better. There is certainly less edema, less multiple open areas on the left calf and the right anterior leg looks somewhat better as well superficial and a little smaller. However he relates pain and erythema over the last 3-4 days in the thigh and I looked at this today. He has not been systemically unwell no fever no chills no change in blood sugar values 05/25/18; comes in today in a better state. The severe cellulitis on his left leg seems better with the Keflex. Not as tender. He has not been systemically unwell ooHard to find an open wound on the left lower leg using his compression pumps twice a day ooThe confluent wounds on his right lateral calf somewhat better looking. These will ultimately need debridement I didn't do this today. 06/01/18; the severe cellulitis on the left anterior thigh has resolved and he is completed his Keflex. ooThere is no open wound on the left leg however there is a superficial excoriation at the base of the third toe dorsally. Skin on the bottom of his left foot is macerated looking. ooThe left the wounds on the lateral right leg actually looks some better although he did require debridement of the top half of this wound area with an open curet 06/09/18 on evaluation today patient appears to be doing  poorly in regard to his right lower extremity in particular this appears to likely be infected he has very thick purulent discharge  along with a bright green tent to the discharge. This makes me concerned about the possibility of pseudomonas. He's also having increased discomfort at this point on evaluation. Fortunately there does not appear to be any evidence of infection spreading to the other location at this time. 06/16/18 on evaluation today patient appears to actually be doing fairly well. His ulcer has actually diminished in size quite significantly at this point which is good news. Nonetheless he still does have some evidence of infection he did see infectious disease this morning before coming here for his appointment. I did review the results of their evaluation and their note today. They did actually have him discontinue the Cipro and initiate treatment with linezolid at this time. He is doing this for the next seven days and they recommended a follow-up in four months with them. He is the keep a log of the need for intermittent antibiotic therapy between now and when he falls back up with infectious disease. This will help them gaze what exactly they need to do to try and help them out. 06/23/18; the patient arrives today with no open wounds on the left leg and left third toe healed. He is been using his compression pumps twice a day. On the right lateral leg he still has a sizable wound but this is a lot better than last time I saw this. 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 Has been using silver alginate under 4 layer compression 06/30/18; the only open wound he has is on the right lateral leg and this looks healthy. No debridement is required. We have been using silver alginate. He does not have an open wound on the left leg. There is apparently some drainage from the dorsal proximal third toe on the left although I see no open wound here. 07/03/18 on evaluation today patient was actually here just for a nurse  visit rapid change. However when he was here on Wednesday for his rat change due to having been healed on the left and then developing blisters we initiated the wrap again knowing that he would be back today for [place] to reevaluate and see were at. Unfortunately he has developed some cellulitis into the proximal portion of his right lower extremity even into the region of his thigh. 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 He was placed on one as what at that point. Nonetheless he is done with that and has been tolerating it well otherwise. Doxycycline which in the past really did not seem to be effective for him. Nonetheless I think the best option may be for [place] to definitely reinitiate the antibiotics for a longer period of time. 07/07/18; since I last saw this patient a week ago he has had a difficult time. At that point he did not have an open wound on his left leg. We transitioned him into juxta light stockings. He was apparently in the clinic the next day with blisters on the left lateral and left medial lower calf. He also had weeping edema fluid. He was put back into a compression wrap. He was also in the clinic on Friday with intense erythema in his right thigh. Per the patient he was started on  Bactrim however that didn't work at all in terms of relieving his pain and swelling. He has taken 3 doxycycline that he had left over from last time and that seems to of helped. He has blistering on the right thigh as well. 07/14/18; the erythema on his right thigh has gotten better with doxycycline that he is finishing. 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 Presumably this cellulitis in the thigh was not related to that although I think this is a bit concerning going forward. He still has an area on the right lateral calf the blister on the right medial calf just below the knee that  was discussed above. On the left 2 small open areas left medial and left lateral. Edema control is adequate. He is using his compression pumps twice a day 07/20/18; continued improvement in the condition of both legs especially the edema in his bilateral thighs. He tells me he is been losing weight through a combination of diet and exercise. He is using his compression pumps twice a day. So overall she made to the remaining wounds 07/27/2018; continued improvement in condition of both legs. His edema is well controlled. The area on the right lateral leg is just about closed he had one blisters show up on the medial left upper calf. We have him in 4 layer compression. He is going on a 10-day trip to [place], [place] and [place]. He will be driving. He wants to wear Unna boots because of the lessening amount of constriction. He will not use compression pumps while he is away 08/05/18 on evaluation today patient actually appears to be doing decently well all things considered in regard to his bilateral lower extremities. The worst ulcer is actually only posterior aspect of his left lower extremity with a four layer compression wrap cut into his leg a couple weeks back. He did have a trip and actually had [employer] for the trip that he is worn since he was last here. Nonetheless he feels like the [employer] actually do better for him his swelling is up a little bit but he also with his trip was not taking his Lasix on a regular set schedule like he was supposed to be. He states that obviously the reason being that he cannot drive and keep going without having to urinate too frequently which makes it difficult. He did not have his pumps with him while he was away either which I think also maybe playing a role here too. 08/13/2018; the patient only has a small open wound on the right lateral calf which is a big improvement in the last month or 2. He also has the area posteriorly just below  the posterior fossa on the left which I think was a wrap injury from several weeks ago. He has no current evidence of cellulitis. He tells me he is back into his compression pumps twice a day. He also tells me that while he was at the laundromat somebody stole a section of his extremitease stockings 08/20/2018; back in the clinic with a much improved state. He only has small areas on the right lateral mid calf which is just about healed. This was is more substantial area for quite a prolonged period of time. He has a small open area on the left anterior tibia. The area on the posterior calf just below the popliteal fossa is closed today.  He is using his compression pumps twice a day 08/28/2018; patient has no open wound on the right leg. He has a smattering of open areas on the calf with some weeping lymphedema. More problematically than that it looks as though his wraps of slipped down in his usual he has very angry upper area of edema just below the right medial knee and on the right lateral calf. He has no open area on his feet. The patient is traveling to [facility] next week. I will send him in an antibiotic. We will continue to wrap the right leg. We ordered extremitease stockings for him last week and I plan to transition the right leg to a stocking when he gets home which will be in 10 days time. As usual he is very reluctant to take his pumps with him when he travels 09/07/2018; patient returns from [facility]. He shows me a picture of his left leg in the mid part of his trip last week with intense fire engine erythema. The picture look bad enough I would have considered sending him to the hospital. Instead he went to the wound care center in [facility]. They did not prescribe him antibiotics but he did take some doxycycline he had leftover from a previous visit. I had given him trimethoprim sulfamethoxazole before he left this did not work according to the patient. This is  resulted in some improvement fortunately. He comes back with a large wound on the left posterior calf. Smaller area on the left anterior tibia. Denuded blisters on the dorsal left foot over his toes. Does not have much in the way of wounds on the right leg although he does have a very tender area on the right posterior area just below the popliteal fossa also suggestive of infection. He promises me he is back on his pumps twice a day 09/15/2018; the intense cellulitis in his left lower calf is a lot better. The wound area on the posterior left calf is also so better. However he has reasonably extensive wounds on the dorsal aspect of his second and third toes and the proximal foot just at the base of the toes. There is nothing open on the right leg 09/22/2018; the patient has excellent edema control in his legs bilaterally. He is using his external compression pumps twice a day. He has no open area on the right leg and only the areas in the left foot dorsally second and third toe area on the left side. He does not have any signs of active cellulitis. 10/06/2018; the patient has good edema control bilaterally. He has no open wound on the right leg. There is a blister in the posterior aspect of his left calf that we had to deal with today. He is using his compression pumps twice a day. There is no signs of active cellulitis. We have been using silver alginate to the wound areas. He still has vulnerable areas on the base of his left first second toes dorsally He has a his extremities stockings and we are going to transition him today into the stocking on the right leg. He is cautioned that he will need to continue to use the compression pumps twice a day. If he notices uncontrolled edema in the right leg he may need to go to 3 times a day. 10/13/2018; the patient came in for a nurse check on Friday he has a large flaccid blister on the right medial calf just below the knee. We unroofed  this. He has this  and a new area underneath the posterior mid calf which was undoubtedly a blister as well. He also has several small areas on the right which is the area we put his extremities stocking on. 10/19/2018; the patient went to see infectious disease this morning I am not sure if that was a routine follow-up in any case the doxycycline I had given him was discontinued and started on linezolid. He has not started this. It is easy to look at his left calf and the inflammation and think this is cellulitis however he is very tender in the tissue just below the popliteal fossa and I have no doubt that there is infection going on here. He states the problem he is having is that with the compression pumps the edema goes down and then starts walking the wrap falls down. We will see if we can adhere this. He has 1 or 2 minuscule open areas on the right still areas that are weeping on the posterior left calf, the base of his left second and third toes 10/26/18; back today in clinic with quite of skin breakdown in his left anterior leg. This may have been infection the area below the popliteal fossa seems a lot better however tremendous epithelial loss on the left anterior mid tibia area over quite inexpensive tissue. He has 2 blisters on the right side but no other open wound here. 10/29/2018; came in urgently to see [place] today and we worked him in for review. He states that the 4 layer compression on the right leg caused pain he had to cut it down to roughly his mid calf this caused swelling above the wrap and he has blisters and skin breakdown today. As a result of the pain he has not been using his pumps. Both legs are a lot more edematous and there is a lot of weeping fluid. 11/02/18; arrives in clinic with continued difficulties in the right leg> left. Leg is swollen and painful. multiple skin blisters and new open areas especially laterally. He has not been using his pumps on the right leg. 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 He is not systemically unwell. 11/09/2018; the patient claims he is being compliant with his pumps. He is finished the doxycycline I gave him last week. Culture I did of the wound on the right lateral leg showed a few very resistant methicillin staph aureus. This was resistant to doxycycline. Nevertheless he states the pain in the leg is a lot better which makes me wonder if the cultured organism was not really what was causing the problem nevertheless this is a very dangerous organism to be culturing out of any wound. His right leg is still a lot larger than the left. He is using an Radio broadcast assistant on this area, he blames a 4-layer compression for causing the original skin breakdown which I doubt is true however I cannot talk him out of it. We have been using silver alginate to all of these areas which were initially blisters 11/16/2018; patient is being compliant with his external compression pumps at twice a day. Miraculously he arrives in clinic today with absolutely no open wounds. He has better edema control on the left where he has been using 4 layer compression versus wound of wounds on the right and I pointed this out to him. There is no inflammation in the skin in his lower legs which is also somewhat unusual for  him. There is no open wounds on the dorsal left foot. He has extremitease stockings at home and I have asked him to bring these in next week. 11/25/18 patient's lower extremity on examination today on the left appears for the most part to be wound free. He does have an open wound on the lateral aspect of the right lower extremity but this is minimal compared to what I've seen in past. He does request that we go ahead and wrap the left leg as well even though there's nothing open just so hopefully it will not reopen in short order. 1/28; patient has superficial open wounds on the right lateral calf left anterior calf and left  posterior calf. His edema control is adequate. 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 We have been using silver alginate as the primary dressing. He claims compliance with his compression pumps 2/4; patient has superficial open wounds on numerous areas of his left calf and again one on the left dorsal foot. The areas on the right lateral calf have healed. The cellulitis that I gave him doxycycline for last week is also resolved this was mostly on the left anterior calf just below the tibial tuberosity. His edema looks fairly well-controlled. He tells me he went to see his primary doctor today and had blood work ordered 2/11; once again he has several open areas on the left calf left tibial area. Most of these are small and appear to have healthy granulation. He does not have anything open on the right. The edema and control in his thighs is pretty good which is usually a good indication he has been using his pumps as requested. 2/18; he continues to have several small areas on the left calf and left tibial area. Most of these are small healthy granulation. We put him in his stocking on the right leg last week and he arrives with a superficial open area over the right upper tibia and a fairly large area on the right lateral tibia in similar condition. His edema control actually does not look too bad, he claims to be using his compression pumps twice a day 2/25. Continued small areas on the left calf and left tibial area. New areas especially on the right are identified just below the tibial tuberosity and on the right upper tibia itself. There are also areas of weeping edema fluid even without an obvious wound. He does not have a considerable degree of lymphedema but clearly there is more edema here than his skin can handle. He states he is using the pumps twice a day. We have an Unna boot on the right and 4  layer compression on the left. 3/3; he continues to have an area on the right lateral calf and right posterior calf just below the popliteal fossa. There is a fair amount of tenderness around the wound on the popliteal fossa but I did not see any evidence of cellulitis, could just be that the wrap came down and rubbed in this area. ooHe does not have an open area on the left leg however there is an area on the left dorsal foot at the base of the third toe ooWe have been using silver alginate to all wound areas 3/10; he did not have an open area on his left leg last time he was here a week ago. T oday he arrives with a horizontal wound just below the  tibial tuberosity and an area on the left lateral calf. He has intense erythema and tenderness in this area. The area is on the right lateral calf and right posterior calf better than last week. We have been using silver alginate as usual 3/18 - Patient returns with 3 small open areas on left calf, and 1 small open area on right calf, the skin looks ok with no significant erythema, he continues the UNA boot on right and 4 layer compression on left. The right lateral calf wound is closed , the right posterior is small area. we will continue silver alginate to the areas. 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 He is been tolerating the dressing changes without complication. Fortunately she has made excellent improvement in regard to the overall status of his wounds. Unfortunately every time we cease wrapping him he ends up reopening in causing more significant issues at that point. Again I'm unsure of the best direction to take although I think the lymphedema clinic may be appropriate for him. 02/03/19 on evaluation today patient appears to be doing well in  regard to the wounds that we saw him for last week unfortunately he has a new area on the proximal portion of his right medial/posterior lower extremity where the wrap somewhat slowed down and caused swelling and a blister to rub and open. Unfortunately this is the only opening that he has on either leg at this point. 02/17/19 on evaluation today patient's bilateral lower extremities appear to be doing well. He still completely healed in regard to the left lower extremity. In regard to the right lower extremity the area where the wrap and slid down and caused the blister still seems to be slightly open although this is dramatically better than during the last evaluation two weeks ago. I'm very pleased with the way this stands overall. 03/03/19 on evaluation today patient appears to be doing well in regard to his right lower extremity in general although he did have a new blister open this does not appear to be showing any evidence of active infection at this time. Fortunately there's No fevers, chills, nausea, or vomiting noted at this time. Overall I feel like he is making good progress it does feel like that the right leg will we perform the [employer] seems to do with a bit better than three layer wrap on the left which slid down on him. We may switch to doing bilateral in the book wraps. 5/4; I have not seen Mr. Milroy in quite some time. According to our case manager he did not have an open wound on his left leg last week. He had 1 remaining wound on the right posterior medial calf. He arrives today with multiple openings on the left leg probably were blisters and/or wrap injuries from Unna boots. I do not think the Unna boot's will provide adequate compression on the left. I am also not clear about the frequency he is using the compression pumps. 03/17/19 on evaluation today patient appears to be doing excellent in regard to his lower extremities compared to last week's evaluation apparently. He  had gotten significantly worse last week which is unfortunate. The [employer] wrap on the left did not seem to do very well for him at all and in fact it didn't control his  swelling significantly enough he had an additional outbreak. Subsequently we go back to the four layer compression wrap on the left. This is good news. At least in that he is doing better and the wound seem to be killing him. He still has not heard anything from the lymphedema clinic. 03/24/19 on evaluation today patient actually appears to be doing much better in regard to his bilateral lower Trinity as compared to last week when I saw him. Fortunately there's no signs of active infection at this time. He has been tolerating the dressing changes without complication. Overall I'm extremely pleased with the progress and appearance in general. 04/07/19 on evaluation today patient appears to be doing well in regard to his bilateral lower extremities. His swelling is significantly down from where it was previous. With that being said he does have a couple blisters still open at this point but fortunately nothing that seems to be too severe and again the majority of the larger openings has healed at this time. 04/14/19 on evaluation today patient actually appears to be doing quite well in regard to his bilateral lower extremities in fact I'm not even sure there's anything significantly open at this time at any site. Nonetheless he did have some trouble with these wraps where they are somewhat irritating him secondary to the fact that he has noted that the graph wasn't too close down to the end of this foot in a little bit short as well up to his knee. Otherwise things seem to be doing quite well. 04/21/19 upon evaluation today patient's wound bed actually showed evidence of being completely healed in regard to both lower extremities which is excellent news. There does not appear to be any signs of active infection which is also good news. I'm  very pleased in this regard. No fevers, chills, nausea, or vomiting noted at this time. 04/28/19 on evaluation today patient appears to be doing a little bit worse in regard to both lower extremities on the left mainly due to the fact that when he went infection disease the wrap was not wrapped quite high enough he developed a blister above this. On the right he is a small open area of nothing too significant but again this is continuing to give him some trouble he has been were in the Velcro compression that he has at home. 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 He's been tolerating the dressing changes without complication. Fortunately there's no signs of active infection at this time. No fevers, chills, nausea, or vomiting noted at this time. We have been trying to get an appointment with her lymphedema clinic in [facility] but unfortunately nobody can get them on phone with not been able to even fax information over the patient likewise is not been able to get in touch with them. Overall I'm not sure exactly what's going on here with to reach out again today. 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 Still having a lot of drainage unfortunately. He tells me especially in the left but even on the right. There's no signs of active infection which is good news we've been using so ratcheted up to this point. 05/19/19 on evaluation today patient actually appears to be doing quite well with regard to his left lower extremity which is great news. Fortunately in regard to the right lower extremity has  an issues with his wrap and he subsequently did remove this from what I'm understanding. Nonetheless long story short is what he had rewrapped once he removed it subsequently had maggots underneath this wrap whenever he came in for evaluation  today. With that being said they were obviously completely cleaned away by the nursing staff. The visit today which is excellent news. However he does appear to potentially have some infection around the right ankle region where the maggots were located as well. He will likely require anabiotic therapy today. 05/26/19 on evaluation today patient actually appears to be doing much better in regard to his bilateral lower extremities. I feel like the infection is under much better control. With that being said there were maggots noted when the wrap was removed yet again today. Again this could have potentially been left over from previous although at this time there does not appear to be any signs of significant drainage there was obviously on the wrap some drainage as well this contracted gnats or otherwise. Either way I do not see anything that appears to be doing worse in my pinion and in fact I think his drainage has slowed down quite significantly likely mainly due to the fact to his infection being under better control. 06/02/2019 on evaluation today patient actually appears to be doing well with regard to his bilateral lower extremities there is no signs of active infection at this time which is great news. With that being said he does have several open areas more so on the right than the left but nonetheless these are all significantly better than previously noted. 06/09/2019 on evaluation today patient actually appears to be doing well. His wrap stayed up and he did not cause any problems he had more drainage on the right compared to the left but overall I do not see any major issues at this time which is great news. 06/16/2019 on evaluation today patient appears to be doing excellent with regard to his lower extremities the only area that is open is a new blister that can have opened as of today on the medial ankle on the left. Other than this he really seems to be doing great I see no major issues at  this point. 06/23/2019 on evaluation today patient appears to be doing quite well with regard to his bilateral lower extremities. In fact he actually appears to be almost completely healed there is a small area of weeping noted of the right lower extremity just above the ankle. Nonetheless fortunately there is no signs of active infection at this time which is good news. No fevers, chills, nausea, vomiting, or diarrhea. 8/24; the patient arrived for a nurse visit today but complained of very significant pain in the left leg and therefore I was asked to look at this. Noted that he did not have an open area on the left leg last week nevertheless this was wrapped. The patient states that he is not been able to put his compression pumps on the left leg because of the discomfort. He has not been systemically unwell 06/30/2019 on evaluation today patient unfortunately despite being excellent last week is doing much worse with regard to his left lower extremity today. In fact he had to come in for a nurse on Monday where his left leg had to be rewrapped due to excessive weeping Dr. Leanord Hawking placed him on doxycycline at that point. Fortunately there is no signs of active infection Systemically at this time which is good news.  07/07/2019 in regard to the patient's wounds today he actually seems to be doing well with his right lower extremity there really is nothing open or draining at this point this is great news. Unfortunately the left lower extremity is given him additional trouble at this time. There does not appear to be any signs of active infection nonetheless he does have a lot of edema and swelling noted at this point as well as blistering all of which has led to a much more poor appearing leg at this time compared to where it was 2 weeks ago when it was almost completely healed. Obviously this is a little discouraging for the patient. He is try to contact the lymphedema clinic in [place] he has not  been able to get through to them. 07/14/2019 on evaluation today patient actually appears to be doing slightly better with regard to his left lower extremity ulcers. Overall I do feel like at least at the top of the wrap that we have been placing this area has healed quite nicely and looks much better. The remainder of the leg is showing signs of improvement. Unfortunately in the thigh area he still has an open region on the left and again on the right he has been utilizing just a Band-Aid on an area that also opened on the thigh. Again this is an area that were not able to wrap although we did do an Ace wrap to provide some compression that something that obviously is a little less effective than the compression wraps we have been using on the lower portion of the leg. He does have an appointment with the lymphedema clinic in [facility] on Friday. 07/21/2019 on evaluation today patient appears to be doing better with regard to his lower extremity ulcers. He has been tolerating the dressing changes without complication. Fortunately there is no signs of active infection at this time. No fevers, chills, nausea, vomiting, or diarrhea. I did receive the paperwork from the physical therapist at the lymphedema clinic in [place]. Subsequently I signed off on that this morning and sent that back to him for further progression with the treatment plan. 07/28/2019 on evaluation today patient appears to be doing very well with regard to his right lower extremity where I do not see any open wounds at this point. Fortunately he is feeling great as far as that is concerned as well. In regard to the left lower extremity he has been having issues with still several areas of weeping and edema although the upper leg is doing better his lower leg still I think is going require the compression wrap at this time. No fevers, chills, nausea, vomiting, or diarrhea. 08/04/2019 on evaluation today patient unfortunately is  having new wounds on the right lower extremity. Again we have been using Unna boot wrap on that side. We switched him to using his juxta lite wrap at home. With that being said he tells me he has been using it although his legs extremely swollen and to be honest really does not appear that he has been. I cannot know that for sure however. Nonetheless he has multiple new wounds on the right lower extremity at this time. Obviously we will have to see about getting this rewrapped for him today. 08/11/2019 on evaluation today patient appears to be doing fairly well with regard to his wounds. He has been tolerating the dressing changes including the compression wraps without complication. He still has a lot of edema in his upper thigh regions  bilaterally he is supposed to be seeing the lymphedema clinic on the 15th of this month once his wraps arrive for the upper part of his legs. 08/18/2019 on evaluation today patient appears to be doing well with regard to his bilateral lower extremities at this point. He has been tolerating the dressing changes without complication. Fortunately there is no signs of active infection which is also good news. He does have a couple weeping areas on the first and second toe of the right foot he also has just a small area on the left foot upper leg and a small area on the left lower leg but overall he is doing quite well in my opinion. He is supposed to be getting his wraps shortly in fact tomorrow and then subsequently is seeing the lymphedema clinic next Wednesday on the 21st. Of note he is also leaving on the 25th to go on vacation for a week to the beach. For that reason and since there is some uncertainty about what there can be doing at lymphedema clinic next Wednesday I am get a make an appointment for next Friday here for [place] to see what we need to do for him prior to him leaving for vacation. 10/23; patient arrives in considerable pain predominantly in the upper  posterior calf just distal to the popliteal fossa also in the wound anteriorly above the major wound. This is probably cellulitis and he has had this recurrently in the past. He has no open wound on the right side and he has had an Radio broadcast assistant in that area. Finally I note that he has an area on the left posterior calf which by enlarge is mostly epithelialized. This protrudes beyond the borders of the surrounding skin in the setting of dry scaly skin and lymphedema. The patient is leaving for [facility] on Sunday. Per his longstanding pattern, he will not take his compression pumps with him predominantly out of fear that they will be stolen. He therefore asked that we put a Unna boot back on the right leg. He will also contact the wound care center in [facility] to see if they can change his dressing in the mid week. 11/3; patient returned from his vacation to [facility]. He was seen on 1 occasion at their wound care center. They did a 2 layer compression system as they did not have our 4-layer wrap. I am not completely certain what they put on the wounds. They did not change the Unna boot on the right. The patient is also seeing a lymphedema specialist physical therapist in [place]. It appears that he has some compression sleeve for his thighs which indeed look quite a bit better than I am used to seeing. He pumps over these with his external compression pumps. 11/10; the patient has a new wound on the right medial thigh otherwise there is no open areas on the right. He has an area on the left leg posteriorly anteriorly and medially and an area over the left second toe. We have been using silver alginate. He thinks the injury on his thigh is secondary to friction from the compression sleeve he has. 11/17; the patient has a new wound on the right medial thigh last week. He thinks this is because he did not have a underlying stocking for his thigh juxta lite apparatus. He now has this. The  area is fairly large and somewhat angry but I do not think he has underlying cellulitis. ooHe has a intact blister on  the right anterior tibial area. ooSmall wound on the right great toe dorsally ooSmall area on the medial left calf. 11/30; the patient does not have any open areas on his right leg and we did not take his juxta lite stocking off. However he states that on Friday his compression wrap fell down lodging around his upper mid calf area. As usual this creates a lot of problems for him. He called urgently today to be seen for a nurse visit however the nurse visit turned into a provider visit because of extreme erythema and pain in the left anterior tibia extending laterally and posteriorly. The area that is problematic is extensive 10/06/2019 upon evaluation today patient actually appears to be doing poorly in regard to his left lower extremity. He Dr. Leanord Hawking did place him on doxycycline this past Monday apparently due to the fact that he was doing much worse in regard to this left leg. Fortunately the doxycycline does seem to be helping. Unfortunately we are still having a very difficult time getting his edema under any type of control in order to anticipate discharge at some point. The only way were really able to control his lymphedema really is with compression wraps and that has only even seemingly temporary. He has been seeing a lymphedema clinic they are trying to help in this regard but still this has been somewhat frustrating in general for the patient. 10/13/19 on evaluation today patient appears to be doing excellent with regard to his right lower extremity as far as the wounds are concerned. His swelling is still quite extensive unfortunately. He is still having a lot of drainage from the thigh areas bilaterally which is unfortunate. He's been going to lymphedema clinic but again he still really does not have this edema under control as far as his lower extremities are concern.  With regard to his left lower extremity this seems to be improving and I do believe the doxycycline has been of benefit for him. He is about to complete the doxycycline. 10/20/2019 on evaluation today patient appears to be doing poorly in regard to his bilateral lower extremities. More in the right thigh he has a lot of irritation at this site unfortunately. In regard to the left lower extremity the wrap was not quite as high it appears and does seem to have caused him some trouble as well. Fortunately there is no evidence of systemic infection though he does have some blue-green drainage which has me concerned for the possibility of Pseudomonas. He tells me he is previously taking Cipro without complications and he really does not care for Levaquin however due to some of the side effects he has. He is not allergic to any medications specifically antibiotics that were aware of. 10/27/2019 on evaluation today patient actually does appear to be for the most part doing better when compared to last week's evaluation. With that being said he still has multiple open wounds over the bilateral lower extremities. He actually forgot to start taking the Cipro and states that he still has the whole bottle. He does have several new blisters on left lower extremity today I think I would recommend he go ahead and take the Cipro based on what I am seeing at this point. 12/30-Patient comes at 1 week visit, 4 layer compression wraps on the left and Unna boot on the right, primary dressing Xtrasorb and silver alginate. Patient is taking his Cipro and has a few more days left probably 5-6, and the legs are doing  better. He states he is using his compressions devices which I believe he has 11/10/2019 on evaluation today patient actually appears to be much better than last time I saw him 2 weeks ago. His wounds are significantly improved and overall I am very pleased in this regard. Fortunately there is no signs of active  infection at this time. He is just a couple of days away from completing Cipro. Overall his edema is much better he has been using his lymphedema pumps which I think is also helping at this point. 11/17/2019 on evaluation today patient appears to be doing excellent in regard to his wounds in general. His legs are swollen but not nearly as much as they have been in the past. Fortunately he is tolerating the compression wraps without complication. No fevers, chills, nausea, vomiting, or diarrhea. He does have some erythema however in the distal portion of his right lower extremity specifically around the forefoot and toes there is a little bit of warmth here as well. 11/24/2019 on evaluation today patient appears to be doing well with regard to his right lower extremity I really do not see any open wounds at this point. His left lower extremity does have several open areas and his right medial thigh also is open. Other than this however overall the patient seems to be making good progress and I am very pleased at this point. 12/01/2019 on evaluation today patient appears to be doing poorly at this point in regard to his left lower extremity has several new blisters despite the fact that we have him in compression wraps. In fact he had a 4-layer compression wrap, his upper thigh wrapped from lymphedema clinic, and a juxta light over top of the 4 layer compression wrap the lymphedema clinic applied and despite all this he still develop blisters underneath. Obviously this does have me concerned about the fact that unfortunately despite what we are doing to try to get wounds healed he continues to have new areas arise I do not think he is ever good to be at the point where he can realistically just use wraps at home to keep things under control. Typically when we heal him it takes about 1-2 days before he is back in the clinic with severe breakdown and blistering of his lower extremities bilaterally. This is  happened numerous times in the past. Unfortunately I think that we may need some help as far as overall fluid overload to kind of limit what we are seeing and get things under better control. 12/08/2019 on evaluation today patient presents for follow-up concerning his ongoing bilateral lower extremity edema. Unfortunately he is still having quite a bit of swelling the compression wraps are controlling this to some degree but he did see Dr. [name] his cardiologist I do have that available for review today as far as the appointment was concerned that was on 12/06/2019. Obviously that she has been 2 days ago. The patient states that he is only been taking the Lasix 80 mg 1 time a day he had told me previously he was taking this twice a day. Nonetheless Dr. [name] recommended this be up to 80 mg 2 times a day for the patient as he did appear to be fluid overloaded. With that being said the patient states he did this yesterday and he was unable to go anywhere or do anything due to the fact that he was constantly having to urinate. Nonetheless I think that this is still good to be something  that is important for him as far as trying to get his edema under control at all things that he is going to be able to just expect his wounds to get under control and things to be better without going through at least a period of time where he is trying to stabilize his fluid management in general and I think increasing the Lasix is likely the first step here. It was also mentioned the possibility that the patient may require metolazone. With that being said he wanted to have the patient take Lasix twice a day first and then reevaluating 2 months to see where things stand. 12/15/2019 upon evaluation today patient appears to be doing regard to his legs although his toes are showing some signs of weeping especially on the left at this point to some degree on the right. There does not appear to be any signs of active infection and  overall I do feel like the compression wraps are doing well for him but he has not been able to take the Lasix at home and the increased dose that Dr. [name] recommended. He tells me that just not go to be feasible for him. Nonetheless I think in this case he should probably send a message to Dr. [name] in order to discuss options from the standpoint of possible admission to get the fluid off or otherwise going forward. 12/22/2019 upon evaluation today patient appears to be doing fairly well with regard to his lower extremities at this point. In fact he would be doing excellent if it was not for the fact that his right anterior thigh apparently had an allergic reaction to adhesive tape that he used. The wound itself that we have been monitoring actually appears to be healed. There is a lot of irritation at this point. 12/29/2019 upon evaluation today patient appears to be doing well in regard to his lower extremities. His left medial thigh is open and somewhat draining today but this is the only region that is open the right has done much better with the treatment utilizing the steroid cream that I prescribed for him last week. Overall I am pleased in that regard. Fortunately there is no signs of active infection at this time. No fevers, chills, nausea, vomiting, or diarrhea. 01/05/2020 upon evaluation today patient appears to be doing more poorly in regard to his right lower extremity at this point upon evaluation today. Unfortunately he continues to have issues in this regard and I think the biggest issue is controlling his edema. This obviously is not very well controlled at this point is been recommended that he use the Lasix twice a day but he has not been able to do that. Unfortunately I think this is leading to an issue where honestly he is not really able to effectively control his edema and therefore the wounds really are not doing significantly better. I do not think that he is going to be able to  keep things under good control unless he is able to control his edema much better. I discussed this again in great detail with him today. 01/12/2020 good news is patient actually appears to be doing quite well today at this point. He does have an appointment with lymphedema clinic tomorrow. His legs appear healed and the toe on the left is almost completely healed. In general I am very pleased with how things stand at this point. 01/19/2020 upon evaluation today patient appears to actually be doing well in regard to his lower extremities  there is nothing open at this point. Fortunately he has done extremely well more recently. Has been seeing lymphedema clinic as well. With that being said he has Velcro wraps for his lower legs as well as his upper legs. The only wound really is on his toe which is the right great toe and this is barely anything even there. With all that being said I think it is good to be appropriate today to go ahead and switch him over to the Velcro compression wraps. 01/26/2020 upon evaluation today patient appears to be doing worse with regard to his lower extremities after last week switch him to Velcro compression wraps. Unfortunately he lasted less than 24 hours he did not have the sock portion of his Velcro wrap on the left leg and subsequently developed a blister underneath the Velcro portion. Obviously this is not good and not what we were looking for at this point. He states the lymphedema clinic did tell him to wear the wrap for 23 hours and take him off for 1 I am okay with that plan but again right now we got a get things back under control again he may have some cellulitis noted as well. 02/02/2020 upon evaluation today patient unfortunately appears to have several areas of blistering on his bilateral lower extremities today mainly on the feet. His legs do seem to be doing somewhat better which is good news. Fortunately there is no evidence of active infection at this time.  No fevers, chills, nausea, vomiting, or diarrhea. 02/16/2020 upon evaluation today patient appears to be doing well at this time with regard to his legs. He has a couple weeping areas on his toes but for the most part everything is doing better and does appear to be sealed up on his legs which is excellent news. We can continue with wrapping him at this point as he had every time we discontinue the wraps he just breaks out with new wounds. There is really no point in is going forward with this at this point. 03/08/2020 upon evaluation today patient actually appears to be doing quite well with regard to his lower extremity ulcers. He has just a very superficial and really almost nonexistent blister on the left lower extremity he has in general done very well with the compression wraps. With that being said I do not see any signs of infection at this time which is good news. 03/29/2020 upon evaluation today patient appears to be doing well with regard to his wounds currently except for where he had several new areas that opened up due to some of the wrap slipping and causing him trouble. He states he did not realize they had slipped. Nonetheless he has a 1 area on the right and 3 new areas on the left. Fortunately there is no signs of active infection at this time which is great news. 04/05/2020 upon evaluation today patient actually appears to be doing quite well in general in regard to his legs currently. Fortunately there is no signs of active infection at this time. No fevers, chills, nausea, vomiting, or diarrhea. He tells me next week that he will actually be seen in the lymphedema clinic on Thursday at 10 AM I see him on Wednesday next week. 04/12/2020 upon evaluation today patient appears to be doing very well with regard to his lower extremities bilaterally. In fact he does not appear to have any open wounds at this point which is good news. Fortunately there is no signs of  active infection at this  time. No fevers, chills, nausea, vomiting, or diarrhea. 04/19/2020 upon evaluation today patient appears to be doing well with regard to his wounds currently on the bilateral lower extremities. There does not appear to be any signs of active infection at this time. Fortunately there is no evidence of systemic infection and overall very pleased at this point. Nonetheless after I held him out last week he literally had blisters the next morning already which swelled up with him being right back here in the clinic. Overall I think that he is just not can be able to be discharged with his legs the way they are he is much to volume overloaded as far as fluid is concerned and that was discussed with him today of also discussed this but should try the clinic nurse manager as well as Dr. Leanord Hawking. 04/26/2020 upon evaluation today patient appears to be doing better with regard to his wounds currently. He is making some progress and overall swelling is under good control with the compression wraps. Fortunately there is no evidence of active infection at this time. 05/10/2020 on evaluation today patient appears to be doing overall well in regard to his lower extremities bilaterally. He is Tolerating the compression wraps without complication and with what we are seeing currently I feel like that he is making excellent progress. There is no signs of active infection at this time. 05/24/2020 upon evaluation today patient appears to be doing well in regard to his legs. The swelling is actually quite a bit down compared to where it has been in the past. Fortunately there is no sign of active infection at this time which is also good news. With that being said he does have several wounds on his toes that have opened up at this point. 05/31/2020 upon evaluation today patient appears to be doing well with regard to his legs bilaterally where he really has no significant fluid buildup at this point overall he seems to be doing  quite well. Very pleased in this regard. With regard to his toes these also seem to be drying up which is excellent. We have continue to wrap him as every time we tried as a transition to the juxta light wraps things just do not seem to get any better. 06/07/2020 upon evaluation today patient appears to be doing well with regard to his right leg at this point. Unfortunately left leg has a lot of blistering he tells me the wrap started to slide down on him when he tried to put his other Velcro wrap over top of it to help keep things in order but nonetheless still had some issues. 06/14/2020 on evaluation today patient appears to be doing well with regard to his lower extremity ulcers and foot ulcers at this point. I feel like everything is actually showing signs of improvement which is great news overall there is no signs of active infection at this time. No fevers, chills, nausea, vomiting, or diarrhea. 06/21/2020 on evaluation today patient actually appears to be doing okay in regard to his wounds in general. With that being said the biggest issue I see is on his right foot in particular the first and second toe seem to be doing a little worse due to the fact this is staying very wet. I think he is probably getting need to change out his dressings a couple times in between each week when we see him in regard to his toes in order to keep this drier  based on the location and how this is proceeding. 06/28/2020 on evaluation today patient appears to be doing a little bit more poorly overall in regard to the appearance of the skin I am actually somewhat concerned about the possibility of him having a little bit of an infection here. We discussed the course of potentially giving him a doxycycline prescription which he is taken previously with good result. With that being said I do believe that this is potentially mild and at this point easily fixed. I just do not want anything to get any worse. 07/12/2020 upon  evaluation today patient actually appears to be making some progress with regard to his legs which is great news there does not appear to be any evidence of active infection. Overall very pleased with where things stand. 07/26/2020 upon evaluation today patient appears to be doing well with regard to his leg ulcers and toe ulcers at this point. He has been tolerating the compression wraps without complication overall very pleased in this regard. 08/09/2020 upon evaluation today patient appears to be doing well with regard to his lower extremities bilaterally. Fortunately there is no signs of active infection overall I am pleased with where things stand. 08/23/2020 on evaluation today patient appears to be doing well with regard to his wound. He has been tolerating the dressing changes without complication. Fortunately there is no signs of active infection at this time. Overall his legs seem to be doing quite well which is great news and I am very pleased in that regard. No fevers, chills, nausea, vomiting, or diarrhea. 09/13/2020 upon evaluation today patient appears to be doing okay in regard to his lower extremities. He does have a fairly large blister on the right leg which I did remove the blister tissue from today so we can get this to dry out other than that however he seems to be doing quite well. There is no signs of active infection at this time. 09/27/2020 upon evaluation today patient appears to actually be doing some better in regard to his right leg. Fortunately signs of active infection at this time which is great news. No fevers, chills, nausea, vomiting, or diarrhea. 10/04/2020 upon evaluation today patient actually appears to be showing signs of improvement which is great news with regard to his leg ulcers. Fortunately there is no signs of active infection which is great news he is still taking the antibiotics currently. No fevers, chills, nausea, vomiting, or diarrhea. 10/18/2020 on  evaluation today patient appears to be doing well with regard to his legs currently. He has been tolerating the dressing changes including the wraps without complication. Fortunately there is no signs of active infection at this time. No fevers, chills, nausea, vomiting, or diarrhea. 10/25/2020 upon evaluation today patient appears to be doing decently well in regard to his wounds currently. He has been tolerating the dressing changes without complication. Overall I feel like he is making good progress albeit slow. Again this is something we can have to continue to wrap for some time to come most likely. 11/08/2020 upon evaluation today patient appears to be doing well with regard to his wounds currently. He has been tolerating the dressing changes without complication is not currently on any antibiotics and he does not appear to show any signs of infection. He does continue to have a lot of drainage on the right leg not too severe but nonetheless this is very scattered. On the left leg this is looking to be much improved overall. 11/15/2020  upon evaluation today patient appears to be doing better with regard to his legs bilaterally. Especially the right leg which was much more significant last week. There does not appear to be any signs of active infection which is great news. No fevers, chills, nausea, vomiting, or diarrhea. 11/23/2019 upon evaluation today patient appears to be doing poorly still in regard to his lower extremities bilaterally. Unfortunately his right leg in particular appears to be doing much more poorly there is no signs really of infection this is not warm to touch but he does have a lot of drainage and weeping unfortunately. With that reason I do believe that we may need to initiate some treatment here to try to help calm down some of the swelling of the right leg. I think switching to a 4-layer compression wrap would be beneficial here. The patient is in agreement with giving this a  try. 11/29/2020 upon evaluation today patient appears to be doing well currently in regard to his leg ulcers. I feel like the right leg is doing better he still has a lot of drainage but we do see some improvement here. The 4-layer compression wrap I think was helpful. 12/06/2020 upon evaluation today patient appears to be doing well with regard to his legs. In fact they seem to be doing about the best I have seen up to this point. Fortunately there is no signs of active infection at this time. No fevers, chills, nausea, vomiting, or diarrhea. 12/20/2020 upon evaluation today patient appears to be doing well at this time in regard to his legs. He is not having any significant draining which is great news. Fortunately there is no signs of active infection at this time. No fevers, chills, nausea, vomiting, or diarrhea. 01/17/2021 upon evaluation today Dionte actually appears to be doing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

## 2021-03-09 NOTE — Progress Notes (Signed)
CREG, GILMER ([id number]) Visit Report for 03/07/2021 Arrival Information Details Patient Name: Date of Service: Kevin PERRIS, [name] 03/07/2021 10:30 A M Medical Record Number: [id number] Patient Account Number: [id number] Date of Birth/Sex: Treating RN: 1951-10-14 (70 y.o. [name] Primary Care Allannah Kempen: [name] Other Clinician: Referring Anberlin Diez: Treating Braison Snoke/Extender: Jeannette Corpus in Treatment: 58 Visit Information History Since Last Visit Added or deleted any medications: No Patient Arrived: [name] new allergies or adverse reactions: No Arrival Time: 11:18 Had a fall or experienced change in No Accompanied By: alone activities of daily living that may affect Transfer Assistance: Stretcher risk of falls: Patient Requires Transmission-Based Precautions: No Signs or symptoms of abuse/neglect since last visito No Patient Has Alerts: Yes Hospitalized since last visit: No Implantable device outside of the clinic excluding No cellular tissue based products placed in the center since last visit: Has Dressing in Place as Prescribed: Yes Has Compression in Place as Prescribed: Yes Pain Present Now: No Electronic Signature(s) Signed: 03/08/2021 5:41:41 PM By: Levan Hurst RN, BSN Entered By: Levan Hurst on 03/07/2021 11:19:37 -------------------------------------------------------------------------------- Compression Therapy Details Patient Name: Date of Service: [name], [name] 03/07/2021 10:30 A M Medical Record Number: [id number] Patient Account Number: [id number] Date of Birth/Sex: Treating RN: 21-May-1951 (70 y.o. [name] Primary Care Talal Fritchman: [name] Other Clinician: Referring Wood Novacek: Treating Farris Blash/Extender: Jeannette Corpus in Treatment: 267 Compression Therapy Performed for Wound Assessment: NonWound Condition Lymphedema - Right Leg Performed By: Clinician Rhae Hammock, RN Compression Type: Four Layer Post Procedure Diagnosis Same as Pre-procedure Electronic Signature(s) Signed: 03/07/2021 7:02:06 PM By: Baruch Gouty RN, BSN Entered By: Baruch Gouty on 03/07/2021 11:38:03 -------------------------------------------------------------------------------- Compression Therapy Details Patient Name: Date of Service: [name], [name] 03/07/2021 10:30 A M Medical Record Number: [id number] Patient Account Number: [id number] Date of Birth/Sex: Treating RN: March 04, 1951 (70 y.o. [name] Primary Care Tyriana Helmkamp: [name] Other Clinician: Referring Inocencia Murtaugh: Treating Chaim Gatley/Extender: Jeannette Corpus in Treatment: 267 Compression Therapy Performed for Wound Assessment: Wound #195 Left,Anterior Lower Leg Performed By: Clinician Rhae Hammock, RN Compression Type: Four Layer Post Procedure Diagnosis Same as Pre-procedure Electronic Signature(s) Signed: 03/07/2021 7:02:06 PM By: Baruch Gouty RN, BSN Entered By: Baruch Gouty on 03/07/2021 11:38:27 -------------------------------------------------------------------------------- Encounter Discharge Information Details Patient Name: Date of Service: [facility], [name] 03/07/2021 10:30 A M Medical Record Number: [id number] Patient Account Number: [id number] Date of Birth/Sex: Treating RN: 1951-01-12 (70 y.o. [name], [name] Primary Care Almee Pelphrey: [name] Other Clinician: Referring Farrell Pantaleo: Treating Gerre Ranum/Extender: Jeannette Corpus in Treatment: 267 Encounter Discharge Information Items Discharge Condition: Stable Ambulatory Status: Walker Discharge Destination: Home Transportation: Private Auto Accompanied By: self Schedule Follow-up Appointment: Yes Clinical Summary of Care: Patient Declined Electronic Signature(s) Signed: 03/09/2021 3:14:58 PM By: Rhae Hammock RN Entered By: Rhae Hammock on 03/07/2021  11:53:55 -------------------------------------------------------------------------------- Lower Extremity Assessment Details Patient Name: Date of Service: Kevin [name], [name] 03/07/2021 10:30 A M Medical Record Number: [id number] Patient Account Number: [id number] Date of Birth/Sex: Treating RN: [birth year] (70 y.o. [name] Primary Care Krisinda Giovanni: [name] Other Clinician: Referring Jarquis Walker: Treating Koran Seabrook/Extender: Jeannette Corpus in Treatment: 267 Edema Assessment Assessed: [name]: No] [name]: No] Edema: [Left: Yes] [Right: Yes] Calf Left: Right: Point of Measurement: 25 cm From Medial Instep 45.5 cm 40.5 cm Ankle Left: Right: Point of Measurement: 9 cm From Medial Instep 28 cm 26 cm Vascular Assessment Pulses: Dorsalis Pedis Palpable: [Left:Yes] [Right:Yes]  Electronic Signature(s) Signed: 03/08/2021 5:41:41 PM By: Levan Hurst RN, BSN Entered By: Levan Hurst on 03/07/2021 11:20:11 -------------------------------------------------------------------------------- Multi Wound Chart Details Patient Name: Date of Service: Kevin [name], [name] 03/07/2021 10:30 A M Medical Record Number: [id number] Patient Account Number: [id number] Date of Birth/Sex: Treating RN: June 03, 1951 (70 y.o. [name] Primary Care Dyasia Firestine: [name] Other Clinician: Referring Jodi Kappes: Treating Sondi Desch/Extender: Jeannette Corpus in Treatment: 267 Vital Signs Height(in): 70 Pulse(bpm): 79 Weight(lbs): 380.2 Blood Pressure(mmHg): 103/61 Body Mass Index(BMI): 55 Temperature(F): 98.4 Respiratory Rate(breaths/min): 20 Photos: [193:No Photos Left T Second oe] [194:No Photos Left, Lateral Lower Leg] [195:No Photos Left, Anterior Lower Leg] Wound Location: [193:Gradually Appeared] [194:Gradually Appeared] [195:Skin Tear/Laceration] Wounding Event: [193:Diabetic Wound/Ulcer of the Lower] [194:Lymphedema] [195:Skin Tear] Primary  Etiology: [193:Extremity Chronic sinus problems/congestion,] [194:Chronic sinus problems/congestion,] [195:Chronic sinus problems/congestion,] Comorbid History: [193:Arrhythmia, Hypertension, Peripheral Arterial Disease, Type II Diabetes, History of Burn, Gout, Confinement Anxiety 01/31/2021] [194:Arrhythmia, Hypertension, Peripheral Arterial Disease, Type II Diabetes, History of Burn, Gout,  Confinement Anxiety 02/14/2021] [195:Arrhythmia, Hypertension, Peripheral Arterial Disease, Type II Diabetes, History of Burn, Gout, Confinement Anxiety 02/14/2021] Date Acquired: [193:5] [194:3] [195:3] Weeks of Treatment: [193:Open] [194:Healed - Epithelialized] [195:Open] Wound Status: [193:No] [194:Yes] [195:No] Clustered Wound: [193:2x2.1x0.1] [194:0x0x0] [195:1x2.6x0.1] Measurements L x W x D (cm) [193:3.299] [194:0] [195:2.042] A (cm) : rea [193:0.33] [194:0] [195:0.204] Volume (cm) : [193:-24.30%] [194:100.00%] [195:-246.70%] % Reduction in Area: [193:-24.50%] [194:100.00%] [195:-245.80%] % Reduction in Volume: [193:Grade 1] [194:Full Thickness Without Exposed] [195:Full Thickness Without Exposed] Classification: [193:Medium] [194:Support Structures None Present] [195:Support Structures Medium] Exudate Amount: [193:Serosanguineous] [194:N/A] [195:Serosanguineous] Exudate Type: [193:red, brown] [194:N/A] [195:red, brown] Exudate Color: [193:Flat and Intact] [194:N/A] [195:Flat and Intact] Wound Margin: [193:Large (67-100%)] [194:None Present (0%)] [195:Large (67-100%)] Granulation Amount: [193:Red] [194:N/A] [195:Red] Granulation Quality: [193:None Present (0%)] [194:None Present (0%)] [195:None Present (0%)] Necrotic Amount: [193:Fat Layer (Subcutaneous Tissue): Yes Fascia: No] [195:Fat Layer (Subcutaneous Tissue): Yes] Exposed Structures: [193:Fascia: No Tendon: No Muscle: No Joint: No Bone: No None] [194:Fat Layer (Subcutaneous Tissue): No Tendon: No Muscle: No Joint: No Bone: No Large (67-100%)]  [195:Fascia: No Tendon: No Muscle: No Joint: No Bone: No Small (1-33%)] Epithelialization: [193:N/A] [194:N/A] [195:Compression Therapy] Treatment Notes Wound #193 (Toe Second) Wound Laterality: Left Cleanser Peri-Wound Care Topical Primary Dressing PolyMem Silver Non-Adhesive Dressing, 4.25x4.25 in Discharge Instruction: Apply to wound bed as instructed Secondary Dressing Woven Gauze Sponges 2x2 in Discharge Instruction: Apply over primary dressing as directed. Secured With Conforming Stretch Gauze Bandage, Sterile 2x75 (in/in) Discharge Instruction: Secure with stretch gauze as directed. Compression Wrap Compression Stockings Add-Ons Wound #195 (Lower Leg) Wound Laterality: Left, Anterior Cleanser Peri-Wound Care Sween Lotion (Moisturizing lotion) Discharge Instruction: Apply moisturizing lotion as directed Topical Primary Dressing KerraCel Ag Gelling Fiber Dressing, 4x5 in (silver alginate) Discharge Instruction: Apply silver alginate to wound bed as instructed Secondary Dressing ABD Pad, 5x9 Discharge Instruction: Apply over primary dressing as directed. Secured With Compression Wrap FourPress (4 layer compression wrap) Discharge Instruction: Apply four layer compression as directed. May also use Miliken CoFlex 2 layer compression system as alternative. Compression Stockings Add-Ons Electronic Signature(s) Signed: 03/07/2021 1:03:06 PM By: Kalman Shan DO Signed: 03/07/2021 7:02:06 PM By: Baruch Gouty RN, BSN Entered By: Kalman Shan on 03/07/2021 12:47:36 -------------------------------------------------------------------------------- Multi-Disciplinary Care Plan Details Patient Name: Date of Service: [facility], Connell J. 03/07/2021 10:30 A M Medical Record Number: [id number] Patient Account Number: [id number] Date of Birth/Sex: Treating RN: 1951/06/01 (70 y.o. [name] Primary Care Martavius Lusty: [name] Other Clinician: Referring  Murle Otting: Treating Jamelia Varano/Extender: Jeannette Corpus in Treatment: Sundance reviewed with physician Active Inactive Venous Leg Ulcer Nursing Diagnoses: Actual venous Insuffiency (use after diagnosis is confirmed) Goals: Patient will maintain optimal edema control Date Initiated: 09/10/2016 Target Resolution Date: 04/04/2021 Goal Status: Active Verify adequate tissue perfusion prior to therapeutic compression application Date Initiated: 09/10/2016 Date Inactivated: 11/28/2016 Goal Status: Met Interventions: Assess peripheral edema status every visit. Compression as ordered Provide education on venous insufficiency Notes: Electronic Signature(s) Signed: 03/07/2021 7:02:06 PM By: Baruch Gouty RN, BSN Entered By: Baruch Gouty on 03/07/2021 11:36:32 -------------------------------------------------------------------------------- Pain Assessment Details Patient Name: Date of Service: Kevin [name], [name] 03/07/2021 10:30 A M Medical Record Number: [id number] Patient Account Number: [id number] Date of Birth/Sex: Treating RN: 08/21/1951 (70 y.o. [name] Primary Care Alacia Rehmann: [name] Other Clinician: Referring Treshon Stannard: Treating Santosh Petter/Extender: Jeannette Corpus in Treatment: 267 Active Problems Location of Pain Severity and Description of Pain Patient Has Paino No Site Locations Pain Management and Medication Current Pain Management: Electronic Signature(s) Signed: 03/08/2021 5:41:41 PM By: Levan Hurst RN, BSN Entered By: Levan Hurst on 03/07/2021 11:20:04 -------------------------------------------------------------------------------- Patient/Caregiver Education Details Patient Name: Date of Service: Kevin [name], [name] 5/4/2022andnbsp10:30 A M Medical Record Number: [id number] Patient Account Number: [id number] Date of Birth/Gender: Treating RN: [birth year] (70 y.o. [name] Primary Care Physician: [name] Other Clinician: Referring Physician: Treating Physician/Extender: Jeannette Corpus in Treatment: 267 Education Assessment Education Provided To: Patient Education Topics Provided Venous: Methods: Explain/Verbal Responses: Reinforcements needed, State content correctly Electronic Signature(s) Signed: 03/07/2021 7:02:06 PM By: Baruch Gouty RN, BSN Entered By: Baruch Gouty on 03/07/2021 11:36:57 -------------------------------------------------------------------------------- Wound Assessment Details Patient Name: Date of Service: Kevin [name], [name] 03/07/2021 10:30 A M Medical Record Number: [id number] Patient Account Number: [id number] Date of Birth/Sex: Treating RN: 1951/03/26 (70 y.o. [name] Primary Care Maxon Kresse: [name] Other Clinician: Referring Sevag Shearn: Treating Dermot Gremillion/Extender: Jeannette Corpus in Treatment: 267 Wound Status Wound Number: [id number] Primary Diabetic Wound/Ulcer of the Lower Extremity Etiology: Wound Location: Left T Second oe Wound Open Wounding Event: Gradually Appeared Status: Date Acquired: 01/31/2021 Comorbid Chronic sinus problems/congestion, Arrhythmia, Hypertension, Weeks Of Treatment: 5 History: Peripheral Arterial Disease, Type II Diabetes, History of Burn, Gout, Clustered Wound: No Confinement Anxiety Photos Wound Measurements Length: (cm) 2 Width: (cm) 2.1 Depth: (cm) 0.1 Area: (cm) 3.299 Volume: (cm) 0.33 % Reduction in Area: -24.3% % Reduction in Volume: -24.5% Epithelialization: None Tunneling: No Undermining: No Wound Description Classification: Grade 1 Wound Margin: Flat and Intact Exudate Amount: Medium Exudate Type: Serosanguineous Exudate Color: red, brown Foul Odor After Cleansing: No Slough/Fibrino No Wound Bed Granulation Amount: Large (67-100%) Exposed Structure Granulation Quality: Red Fascia Exposed:  No Necrotic Amount: None Present (0%) Fat Layer (Subcutaneous Tissue) Exposed: Yes Tendon Exposed: No Muscle Exposed: No Joint Exposed: No Bone Exposed: No Treatment Notes Wound #193 (Toe Second) Wound Laterality: Left Cleanser Peri-Wound Care Topical Primary Dressing PolyMem Silver Non-Adhesive Dressing, 4.25x4.25 in Discharge Instruction: Apply to wound bed as instructed Secondary Dressing Woven Gauze Sponges 2x2 in Discharge Instruction: Apply over primary dressing as directed. Secured With Conforming Stretch Gauze Bandage, Sterile 2x75 (in/in) Discharge Instruction: Secure with stretch gauze as directed. Compression Wrap Compression Stockings Add-Ons Electronic Signature(s) Signed: 03/07/2021 4:55:09 PM By: Sandre Kitty Signed: 03/08/2021 5:41:41 PM By: Levan Hurst RN, BSN Entered By: Sandre Kitty on 03/07/2021 16:42:56 -------------------------------------------------------------------------------- Wound Assessment Details Patient Name: Date of Service: Kevin [name], Kevin  J. 03/07/2021 10:30 A M Medical Record Number: [id number] Patient Account Number: [id number] Date of Birth/Sex: Treating RN: Apr 10, 1951 (70 y.o. [name] Primary Care Bolden Hagerman: [name] Other Clinician: Referring Deveney Bayon: Treating Raunel Dimartino/Extender: Jeannette Corpus in Treatment: 267 Wound Status Wound Number: [id number] Primary Lymphedema Etiology: Wound Location: Left, Lateral Lower Leg Wound Healed - Epithelialized Wounding Event: Gradually Appeared Status: Date Acquired: 02/14/2021 Comorbid Chronic sinus problems/congestion, Arrhythmia, Hypertension, Weeks Of Treatment: 3 History: Peripheral Arterial Disease, Type II Diabetes, History of Burn, Gout, Clustered Wound: Yes Confinement Anxiety Wound Measurements Length: (cm) Width: (cm) Depth: (cm) Area: (cm) Volume: (cm) 0 % Reduction in Area: 100% 0 % Reduction in Volume: 100% 0 Epithelialization:  Large (67-100%) 0 Tunneling: No 0 Undermining: No Wound Description Classification: Full Thickness Without Exposed Support Structures Exudate Amount: None Present Foul Odor After Cleansing: No Slough/Fibrino No Wound Bed Granulation Amount: None Present (0%) Exposed Structure Necrotic Amount: None Present (0%) Fascia Exposed: No Fat Layer (Subcutaneous Tissue) Exposed: No Tendon Exposed: No Muscle Exposed: No Joint Exposed: No Bone Exposed: No Electronic Signature(s) Signed: 03/08/2021 5:41:41 PM By: Levan Hurst RN, BSN Entered By: Levan Hurst on 03/07/2021 11:21:51 -------------------------------------------------------------------------------- Wound Assessment Details Patient Name: Date of Service: Kevin [name], [name] 03/07/2021 10:30 A M Medical Record Number: [id number] Patient Account Number: [id number] Date of Birth/Sex: Treating RN: 11-26-1950 (69 y.o. [name] Primary Care Devanta Daniel: [name] Other Clinician: Referring Shatonya Passon: Treating Alicen Donalson/Extender: Jeannette Corpus in Treatment: 267 Wound Status Wound Number: [id number] Primary Skin T ear Etiology: Wound Location: Left, Anterior Lower Leg Wound Open Wounding Event: Skin Tear/Laceration Status: Date Acquired: 02/14/2021 Comorbid Chronic sinus problems/congestion, Arrhythmia, Hypertension, Weeks Of Treatment: 3 History: Peripheral Arterial Disease, Type II Diabetes, History of Burn, Gout, Clustered Wound: No Confinement Anxiety Photos Wound Measurements Length: (cm) 1 Width: (cm) 2.6 Depth: (cm) 0.1 Area: (cm) 2.042 Volume: (cm) 0.204 % Reduction in Area: -246.7% % Reduction in Volume: -245.8% Epithelialization: Small (1-33%) Tunneling: No Undermining: No Wound Description Classification: Full Thickness Without Exposed Support Structures Wound Margin: Flat and Intact Exudate Amount: Medium Exudate Type: Serosanguineous Exudate Color: red, brown Foul Odor After  Cleansing: No Slough/Fibrino No Wound Bed Granulation Amount: Large (67-100%) Exposed Structure Granulation Quality: Red Fascia Exposed: No Necrotic Amount: None Present (0%) Fat Layer (Subcutaneous Tissue) Exposed: Yes Tendon Exposed: No Muscle Exposed: No Joint Exposed: No Bone Exposed: No Treatment Notes Wound #195 (Lower Leg) Wound Laterality: Left, Anterior Cleanser Peri-Wound Care Sween Lotion (Moisturizing lotion) Discharge Instruction: Apply moisturizing lotion as directed Topical Primary Dressing KerraCel Ag Gelling Fiber Dressing, 4x5 in (silver alginate) Discharge Instruction: Apply silver alginate to wound bed as instructed Secondary Dressing ABD Pad, 5x9 Discharge Instruction: Apply over primary dressing as directed. Secured With Compression Wrap FourPress (4 layer compression wrap) Discharge Instruction: Apply four layer compression as directed. May also use Miliken CoFlex 2 layer compression system as alternative. Compression Stockings Add-Ons Electronic Signature(s) Signed: 03/07/2021 4:55:09 PM By: Sandre Kitty Signed: 03/08/2021 5:41:41 PM By: Levan Hurst RN, BSN Entered By: Sandre Kitty on 03/07/2021 16:41:22 -------------------------------------------------------------------------------- Vitals Details Patient Name: Date of Service: Kevin [name], Edwardo J. 03/07/2021 10:30 A M Medical Record Number: [id number] Patient Account Number: [id number] Date of Birth/Sex: Treating RN: 1951/01/16 (70 y.o. [name] Primary Care Thomasa Heidler: [name] Other Clinician: Referring Jaylise Peek: Treating Dorraine Ellender/Extender: Jeannette Corpus in Treatment: 267 Vital Signs Time Taken: 11:18 Temperature (F): 98.4 Height (in): 70 Pulse (bpm): 79 Weight (lbs): 380.2  Respiratory Rate (breaths/min): 20 Body Mass Index (BMI): 54.5 Blood Pressure (mmHg): 103/61 Reference Range: 80 - 120 mg / dl Electronic Signature(s) Signed: 03/08/2021  5:41:41 PM By: Levan Hurst RN, BSN Entered By: Levan Hurst on 03/07/2021 11:19:54

## 2021-03-12 ENCOUNTER — Telehealth: Payer: Self-pay | Admitting: Family Medicine

## 2021-03-12 NOTE — Telephone Encounter (Signed)
   Telephone encounter was:  Unsuccessful.  03/12/2021 Name: [name] MRN: [id number] DOB: November 23, 1950  Unsuccessful outbound call made today to assist with:  Food Insecurity  Outreach Attempt:  3rd Attempt.  Referral closed unable to contact patient.  A HIPAA compliant voice message was left requesting a return call.  Instructed patient to call back at [phone]. Referral is now closed, if patient still needs help please place another referral in the system. Thank you.   [name] Care Guide, Embedded Care Coordination [facility], Care Management Phone: [phone] Email: [email or web address]

## 2021-03-13 NOTE — Progress Notes (Signed)
DREQUAN, IRONSIDE ([id number]) Visit Report for 02/28/2021 SuperBill Details Patient Name: Date of Service: CO ROGER, KETTLES 02/28/2021 Medical Record Number: [id number] Patient Account Number: [id number] Date of Birth/Sex: Treating RN: 08/15/1951 (69 y.o. Male) Shawn Stall Primary Care Provider: Nicoletta Ba Other Clinician: Referring Provider: Treating Provider/Extender: Debe Coder in Treatment: 266 Diagnosis Coding ICD-10 Codes Code Description [phone] Non-pressure chronic ulcer of other 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 of skin I87.333 Chronic venous hypertension (idiopathic) with ulcer and inflammation of bilateral lower extremity I89.0 Lymphedema, not elsewhere classified E11.622 Type 2 diabetes mellitus with other skin ulcer 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, unspecified Facility Procedures CPT4 Description Modifier Quantity Code [id number] [phone] BILATERAL: Application of multi-layer venous compression system; leg (below knee), including ankle and 1 foot. Electronic Signature(s) Signed: 02/28/2021 5:19:23 PM By: Shawn Stall Signed: 03/13/2021 3:23:56 PM By: Geralyn Corwin DO Entered By: Shawn Stall on 02/28/2021 12:44:48

## 2021-03-14 ENCOUNTER — Other Ambulatory Visit: Payer: Self-pay

## 2021-03-14 ENCOUNTER — Encounter (HOSPITAL_BASED_OUTPATIENT_CLINIC_OR_DEPARTMENT_OTHER): Payer: Medicare Other | Admitting: Physician Assistant

## 2021-03-14 DIAGNOSIS — L97822 Non-pressure chronic ulcer of other part of left lower leg with fat layer exposed: Secondary | ICD-10-CM | POA: Diagnosis not present

## 2021-03-14 DIAGNOSIS — I890 Lymphedema, not elsewhere classified: Secondary | ICD-10-CM | POA: Diagnosis not present

## 2021-03-14 DIAGNOSIS — E11621 Type 2 diabetes mellitus with foot ulcer: Secondary | ICD-10-CM | POA: Diagnosis not present

## 2021-03-14 DIAGNOSIS — E11622 Type 2 diabetes mellitus with other skin ulcer: Secondary | ICD-10-CM | POA: Diagnosis not present

## 2021-03-14 DIAGNOSIS — E1140 Type 2 diabetes mellitus with diabetic neuropathy, unspecified: Secondary | ICD-10-CM | POA: Diagnosis not present

## 2021-03-14 DIAGNOSIS — I87333 Chronic venous hypertension (idiopathic) with ulcer and inflammation of bilateral lower extremity: Secondary | ICD-10-CM | POA: Diagnosis not present

## 2021-03-14 DIAGNOSIS — L97522 Non-pressure chronic ulcer of other part of left foot with fat layer exposed: Secondary | ICD-10-CM | POA: Diagnosis not present

## 2021-03-14 NOTE — Progress Notes (Signed)
MICHAE, GRIMLEY ([id number]) Visit Report for 03/14/2021 Arrival Information Details Patient Name: Date of Service: CO ROBB, SIBAL 03/14/2021 10:30 A M Medical Record Number: [id number] Patient Account Number: [id number] Date of Birth/Sex: Treating RN: 1951-08-26 (70 y.o. [name] Primary Care Siri Buege: Nicoletta Ba Other Clinician: Referring Hasaan Radde: Treating Lakota Schweppe/Extender: Adele Dan in Treatment: 268 Visit Information History Since Last Visit Added or deleted any medications: No Patient Arrived: [name] Any new allergies or adverse reactions: No Arrival Time: 11:17 Had a fall or experienced change in No Accompanied By: self activities of daily living that may affect Transfer Assistance: None risk of falls: Patient Identification Verified: Yes Signs or symptoms of abuse/neglect since last visito No Secondary Verification Process Completed: Yes Hospitalized since last visit: No Patient Requires Transmission-Based Precautions: No Implantable device outside of the clinic excluding No Patient Has Alerts: Yes cellular tissue based products placed in the center since last visit: Has Dressing in Place as Prescribed: Yes Pain Present Now: No Electronic Signature(s) Signed: 03/14/2021 4:50:41 PM By: Karl Ito Entered By: Karl Ito on 03/14/2021 11:18:18 -------------------------------------------------------------------------------- Compression Therapy Details Patient Name: Date of Service: CO AAKASH, HOLLOMON. 03/14/2021 10:30 A M Medical Record Number: [id number] Patient Account Number: [id number] Date of Birth/Sex: Treating RN: 03-14-1951 (70 y.o. Lucious Groves Primary Care Lorrain Rivers: Nicoletta Ba Other Clinician: Referring Jaxxon Naeem: Treating Marcianne Ozbun/Extender: Adele Dan in Treatment: 268 Compression Therapy Performed for Wound Assessment: Wound #193 Left T Second oe Performed By:  Clinician Fonnie Mu, RN Compression Type: Four Layer Electronic Signature(s) Signed: 03/14/2021 5:14:45 PM By: Fonnie Mu RN Entered By: Fonnie Mu on 03/14/2021 12:03:32 -------------------------------------------------------------------------------- Compression Therapy Details Patient Name: Date of Service: [facility], Dimarco J. 03/14/2021 10:30 A M Medical Record Number: [id number] Patient Account Number: [id number] Date of Birth/Sex: Treating RN: July 29, 1951 (70 y.o. Lucious Groves Primary Care Yonas Bunda: Other Clinician: Nicoletta Ba Referring Webb Weed: Treating Kairi Tufo/Extender: Adele Dan in Treatment: 268 Compression Therapy Performed for Wound Assessment: Wound #195 Left,Anterior Lower Leg Performed By: Clinician Fonnie Mu, RN Compression Type: Four Layer Electronic Signature(s) Signed: 03/14/2021 5:14:45 PM By: Fonnie Mu RN Entered By: Fonnie Mu on 03/14/2021 12:03:32 -------------------------------------------------------------------------------- Encounter Discharge Information Details Patient Name: Date of Service: [facility], Kunta J. 03/14/2021 10:30 A M Medical Record Number: [id number] Patient Account Number: [id number] Date of Birth/Sex: Treating RN: [birth year] (69 y.o. Charlean Merl, [name] Primary Care Lahela Woodin: Nicoletta Ba Other Clinician: Referring Somaly Marteney: Treating Annais Crafts/Extender: Adele Dan in Treatment: 268 Encounter Discharge Information Items Discharge Condition: Stable Ambulatory Status: Cane Discharge Destination: Home Transportation: Private Auto Accompanied By: SELF Schedule Follow-up Appointment: Yes Clinical Summary of Care: Patient Declined Electronic Signature(s) Signed: 03/14/2021 5:14:45 PM By: Fonnie Mu RN Entered By: Fonnie Mu on 03/14/2021  12:04:33 -------------------------------------------------------------------------------- Pain Assessment Details Patient Name: Date of Service: [name], Captain J. 03/14/2021 10:30 A M Medical Record Number: [id number] Patient Account Number: [id number] Date of Birth/Sex: Treating RN: [birth year] (70 y.o. [name] Primary Care Zaydah Nawabi: Nicoletta Ba Other Clinician: Referring Taveon Enyeart: Treating Graycie Halley/Extender: Adele Dan in Treatment: 268 Active Problems Location of Pain Severity and Description of Pain Patient Has Paino No Site Locations Pain Management and Medication Current Pain Management: Electronic Signature(s) Signed: 03/14/2021 4:50:41 PM By: Karl Ito Signed: 03/14/2021 6:13:52 PM By: Zenaida Deed RN, BSN Entered By: Karl Ito on 03/14/2021 11:18:58 -------------------------------------------------------------------------------- Patient/Caregiver Education Details Patient Name: Date of Service: [name], Dang  J. 5/11/2022andnbsp10:30 A M Medical Record Number: [id number] Patient Account Number: [id number] Date of Birth/Gender: Treating RN: [birth year] (70 y.o. Lucious Groves Primary Care Physician: Nicoletta Ba Other Clinician: Referring Physician: Treating Physician/Extender: Adele Dan in Treatment: [phone] Education Assessment Education Provided To: Patient Education Topics Provided Venous: Methods: Explain/Verbal Responses: State content correctly Electronic Signature(s) Signed: 03/14/2021 5:14:45 PM By: Fonnie Mu RN Entered By: Fonnie Mu on 03/14/2021 12:04:15 -------------------------------------------------------------------------------- Wound Assessment Details Patient Name: Date of Service: [name], Dannon J. 03/14/2021 10:30 A M Medical Record Number: [id number] Patient Account Number: [id number] Date of Birth/Sex: Treating RN: 04-May-1951 (70 y.o. [name] Primary Care Zavier Canela: Nicoletta Ba Other Clinician: Referring Lonni Dirden: Treating Janelle Culton/Extender: Adele Dan in Treatment: 268 Wound Status Wound Number: 193 Primary Etiology: Diabetic Wound/Ulcer of the Lower Extremity Wound Location: Left T Second oe Wound Status: Open Wounding Event: Gradually Appeared Date Acquired: 01/31/2021 Weeks Of Treatment: 6 Clustered Wound: No Wound Measurements Length: (cm) 2 Width: (cm) 2.1 Depth: (cm) 0.1 Area: (cm) 3.299 Volume: (cm) 0.33 % Reduction in Area: -24.3% % Reduction in Volume: -24.5% Wound Description Classification: Grade 1 Treatment Notes Wound #193 (Toe Second) Wound Laterality: Left Cleanser Peri-Wound Care Topical Primary Dressing PolyMem Silver Non-Adhesive Dressing, 4.25x4.25 in Discharge Instruction: Apply to wound bed as instructed Secondary Dressing Woven Gauze Sponges 2x2 in Discharge Instruction: Apply over primary dressing as directed. Secured With Conforming Stretch Gauze Bandage, Sterile 2x75 (in/in) Discharge Instruction: Secure with stretch gauze as directed. Compression Wrap Compression Stockings Add-Ons Electronic Signature(s) Signed: 03/14/2021 4:50:41 PM By: Karl Ito Signed: 03/14/2021 6:13:52 PM By: Zenaida Deed RN, BSN Entered By: Karl Ito on 03/14/2021 11:19:18 -------------------------------------------------------------------------------- Wound Assessment Details Patient Name: Date of Service: [name], Johnattan J. 03/14/2021 10:30 A M Medical Record Number: [id number] Patient Account Number: [id number] Date of Birth/Sex: Treating RN: May 01, 1951 (70 y.o. [name] Primary Care Lonie Rummell: Nicoletta Ba Other Clinician: Referring Janal Haak: Treating Paulena Servais/Extender: Adele Dan in Treatment: 268 Wound Status Wound Number: 195 Primary Etiology: Skin Tear Wound Location: Left, Anterior Lower  Leg Wound Status: Open Wounding Event: Skin Tear/Laceration Date Acquired: 02/14/2021 Weeks Of Treatment: 4 Clustered Wound: No Wound Measurements Length: (cm) 1 Width: (cm) 2.6 Depth: (cm) 0.1 Area: (cm) 2.042 Volume: (cm) 0.204 % Reduction in Area: -246.7% % Reduction in Volume: -245.8% Wound Description Classification: Full Thickness Without Exposed Support Structur es Treatment Notes Wound #195 (Lower Leg) Wound Laterality: Left, Anterior Cleanser Peri-Wound Care Sween Lotion (Moisturizing lotion) Discharge Instruction: Apply moisturizing lotion as directed Topical Primary Dressing KerraCel Ag Gelling Fiber Dressing, 4x5 in (silver alginate) Discharge Instruction: Apply silver alginate to wound bed as instructed Secondary Dressing ABD Pad, 5x9 Discharge Instruction: Apply over primary dressing as directed. Secured With Compression Wrap FourPress (4 layer compression wrap) Discharge Instruction: Apply four layer compression as directed. May also use Miliken CoFlex 2 layer compression system as alternative. Compression Stockings Add-Ons Electronic Signature(s) Signed: 03/14/2021 4:50:41 PM By: Karl Ito Signed: 03/14/2021 6:13:52 PM By: Zenaida Deed RN, BSN Entered By: Karl Ito on 03/14/2021 11:19:18 -------------------------------------------------------------------------------- Vitals Details Patient Name: Date of Service: [name], Moustapha J. 03/14/2021 10:30 A M Medical Record Number: [id number] Patient Account Number: [id number] Date of Birth/Sex: Treating RN: 28-Sep-1951 (70 y.o. [name] Primary Care Jerrye Seebeck: Nicoletta Ba Other Clinician: Referring [name] Temkin: Treating Dejia Ebron/Extender: Adele Dan in Treatment: 268 Vital Signs Time Taken: 11:18 Temperature (F): 97.5 Height (in): 70 Pulse (  bpm): 75 Weight (lbs): 380.2 Respiratory Rate (breaths/min): 20 Body Mass Index (BMI): 54.5 Blood Pressure  (mmHg): 179/74 Reference Range: 80 - 120 mg / dl Electronic Signature(s) Signed: 03/14/2021 4:50:41 PM By: Karl Ito Entered By: Karl Ito on 03/14/2021 11:18:49

## 2021-03-14 NOTE — Progress Notes (Signed)
HAYES, CZAJA ([id number]) Visit Report for 03/14/2021 SuperBill Details Patient Name: Date of Service: CO CYPRESS, [name] 03/14/2021 Medical Record Number: [id number] Patient Account Number: [id number] Date of Birth/Sex: Treating RN: 1951/03/06 (70 y.o. 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 Electronic Signature(s) Signed: 03/14/2021 5:14:45 PM By: Fonnie Mu RN Signed: 03/14/2021 5:37:47 PM By: Lenda Kelp PA-C Entered By: Fonnie Mu on 03/14/2021 12:04:51

## 2021-03-19 DIAGNOSIS — E1142 Type 2 diabetes mellitus with diabetic polyneuropathy: Secondary | ICD-10-CM | POA: Diagnosis not present

## 2021-03-19 DIAGNOSIS — Z794 Long term (current) use of insulin: Secondary | ICD-10-CM | POA: Diagnosis not present

## 2021-03-19 DIAGNOSIS — B351 Tinea unguium: Secondary | ICD-10-CM | POA: Diagnosis not present

## 2021-03-21 ENCOUNTER — Encounter (HOSPITAL_BASED_OUTPATIENT_CLINIC_OR_DEPARTMENT_OTHER): Payer: Medicare Other | Admitting: Internal Medicine

## 2021-03-21 ENCOUNTER — Other Ambulatory Visit: Payer: Self-pay

## 2021-03-21 DIAGNOSIS — E11621 Type 2 diabetes mellitus with foot ulcer: Secondary | ICD-10-CM | POA: Diagnosis not present

## 2021-03-21 DIAGNOSIS — I87333 Chronic venous hypertension (idiopathic) with ulcer and inflammation of bilateral lower extremity: Secondary | ICD-10-CM | POA: Diagnosis not present

## 2021-03-21 DIAGNOSIS — L97822 Non-pressure chronic ulcer of other part of left lower leg with fat layer exposed: Secondary | ICD-10-CM | POA: Diagnosis not present

## 2021-03-21 DIAGNOSIS — I890 Lymphedema, not elsewhere classified: Secondary | ICD-10-CM | POA: Diagnosis not present

## 2021-03-21 DIAGNOSIS — E11622 Type 2 diabetes mellitus with other skin ulcer: Secondary | ICD-10-CM | POA: Diagnosis not present

## 2021-03-21 DIAGNOSIS — E1140 Type 2 diabetes mellitus with diabetic neuropathy, unspecified: Secondary | ICD-10-CM | POA: Diagnosis not present

## 2021-03-21 DIAGNOSIS — L97522 Non-pressure chronic ulcer of other part of left foot with fat layer exposed: Secondary | ICD-10-CM | POA: Diagnosis not present

## 2021-03-21 NOTE — Progress Notes (Signed)
[name], [name] ([id number]) Visit Report for 03/21/2021 Chief Complaint Document Details Patient Name: Date of Service: [name], [name] 03/21/2021 10:30 A M Medical Record Number: [id number] Patient Account Number: [id number] Date of Birth/Sex: Treating RN: 03-13-1951 (70 y.o. 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 03/21/2021 10:30 A M Medical Record Number: [id number] Patient Account Number: [id number] Date of Birth/Sex: Treating RN: 1951-08-03 (70 y.o. [name] Primary Care Provider: Nicoletta [name] Other Clinician: Referring Provider: Treating Provider/Extender: [name] in Treatment: 269 History of Present Illness HPI Description: Referred by PCP for consultation. Patient has long standing history of BLE venous stasis, no prior ulcerations. At beginning of month, developed cellulitis and weeping. Received IM Rocephin followed by Keflex and resolved. Wears compression stocking, appr 6 months old. Not sure strength. No present drainage. 01/22/16 this is a patient who is a type II diabetic on insulin. 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 He tells me he religiously wears pressure stockings of uncertain strength. He was here with weeping edema about 8 months ago but did not have an open wound. Roughly a month ago he had a reopening on his bilateral legs. He is been using bandages and Neosporin. He  does not complain of pain. 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 Last BUN/creatinine I have is from 11/20/15 at 13 and 1.0 respectively 01/29/16; patient arrives today having tolerated the Profore wrap. He brought in his stockings and these are 18 mmHg stockings he bought from [place]. The compression here is likely inadequate. He does not complain of pain or excessive drainage she has no systemic symptoms. The wound on the right looks improved as does the one on the left although one on the left is more substantial with still tissue at risk below the actual wound area on the bilateral posterior calf 02/05/16; patient arrives with poor edema control. He states that we did put a 4 layer compression on it last week. No weight appear 5 this. 02/12/16; the area on the posterior right Has healed. The left Has a substantial wound that has necrotic surface eschar that requires a debridement with a curette. 02/16/16;the patient called or a Nurse visit secondary to increased swelling. He had been in earlier in the week with his right leg healed. He was transitioned to is on pressure stocking on the right leg with the only open wound on the left, a substantial area on the left posterior calf. Note he has a history of severe lower extremity edema, he has a history of chronic atrial fibrillation but not heart failure per my notes but I'll need to research this. He is not complaining of chest pain shortness of breath or orthopnea. The intake nurse noted blisters on the previously closed right leg 02/19/16; this is the patient's regular visit day. I see him on Friday with escalating edema new wounds on the right leg and clear signs of at least right ventricular heart failure. I increased his Lasix to 40 twice a day. He is returning  currently in follow-up. States he is noticed a decrease in that the  edema 02/26/16 patient's legs have much less edema. There is nothing really open on the right leg. The left leg has improved condition of the large superficial wound on the posterior left leg 03/04/16; edema control is very much better. The patient's right leg wounds have healed. On the left leg he continues to have severe venous inflammation on the posterior aspect of the left leg. There is no tenderness and I don't think any of this is cellulitis. 03/11/16; patient's right leg is married healed and he is in his own stocking. The patient's left leg has deteriorated somewhat. There is a lot of erythema around the wound on the posterior left leg. There is also a significant rim of erythema posteriorly just above where the wrap would've ended there is a new wound in this location and a lot of tenderness. Can't rule out cellulitis in this area. 03/15/16; patient's right leg remains healed and he is in his own stocking. The patient's left leg is much better than last review. His major wound on the posterior aspect of his left Is almost fully epithelialized. He has 3 small injuries from the wraps. Really. Erythema seems a lot better on antibiotics 03/18/16; right leg remains healed and he is in his own stocking. The patient's left leg is much better. The area on the posterior aspect of the left calf is fully epithelialized. His 3 small injuries which were wrap injuries on the left are improved only one seems still open his erythema has resolved 03/25/16; patient's right leg remains healed and he is in his own stocking. There is no open area today on the left leg posterior leg is completely closed up. His wrap injuries at the superior aspect of his leg are also resolved. He looks as though he has some irritation on the dorsal ankle but this is fully epithelialized without evidence of infection. 03/28/16; we discharged this patient on Monday. Transitioned him into his own stocking. There were problems almost  immediately with uncontrolled swelling weeping edema multiple some of which have opened. He does not feel systemically unwell in particular no chest pain no shortness of breath and he does not feel 04/08/16; the edema is under better control with the Profore light wrap but he still has pitting edema. There is one large wound anteriorly 2 on the medial aspect of his left leg and 3 small areas on the superior posterior calf. Drainage is not excessive he is tolerating a Profore light well 04/15/16; put a Profore wrap on him last week. This is controlled is edema however he had a lot of pain on his left anterior foot most of his wounds are healed 04/22/16 once again the patient has denuded areas on the left anterior foot which he states are because his wrap slips up word. He saw his primary physician today is on Lasix 40 twice a day and states that he his weight is down 20 pounds over the last 3 months. 04/29/16: Much improved. left anterior foot much improved. He is now on Lasix 80 mg per day. Much improved edema control 05/06/16; I was hoping to be able to discharge him today however once again he has blisters at a low level of where the compression was placed last week mostly on his left lateral but also his left medial leg and a small area on the anterior part of the left foot. 05/09/16; apparently the patient went home after his  appointment on 7/4 later in the evening developing pain in his upper medial thigh together with subjective fever and chills although his temperature was not taken. The pain was so intense he felt he would probably have to call 911. However he then remembered that he had leftover doxycycline from a previous round of antibiotics and took these. By the next morning he felt a lot better. He called and spoke to one of our nurses and I approved doxycycline over the phone thinking that this was in relation to the wounds we had previously seen although they were definitely were not. The  patient feels a lot better old fever no chills he is still working. Blood sugars are reasonably controlled 05/13/16; patient is back in for review of his cellulitis on his anterior medial upper thigh. He is taking doxycycline this is a lot better. Culture I did of the nodular area on the dorsal aspect of his foot grew MRSA this also looks a lot better. 05/20/16; the patient is cellulitis on the medial upper thigh has resolved. All of his wound areas including the left anterior foot, areas on the medial aspect of the left calf and the lateral aspect of the calf at all resolved. He has a new blister on the left dorsal foot at the level of the fourth toe this was excised. No evidence of infection 05/27/16; patient continues to complain weeping edema. He has new blisterlike wounds on the left anterior lateral and posterior lateral calf at the top of his wrap levels. The area on his left anterior foot appears better. He is not complaining of fever, pain or pruritus in his feet. 05/30/16; the patient's blisters on his left anterior leg posterior calf all look improved. He did not increase the Lasix 100 mg as I suggested because he was going to run out of his 40 mg tablets. He is still having weeping edema of his toes 06/03/16; I renewed his Lasix at 80 mg once a day as he was about to run out when I last saw him. He is on 80 mg of Lasix now. I have asked him to cut down on the excessive amount of water he was drinking and asked him to drink according to his thirst mechanisms 06/12/2016 -- was seen 2 days ago and was supposed to wear his compression stockings at home but he is developed lymphedema and superficial blisters on the left lower extremity and hence came in for a review 06/24/16; the remaining wound is on his left anterior leg. He still has edema coming from between his toes. There is lymphedema here however his edema is generally better than when I last saw this. He has a history of atrial fibrillation  but does not have a known history of congestive heart failure nevertheless I think he probably has this at least on a diastolic basis. 07/01/16 I reviewed his echocardiogram from January 2017. This was essentially normal. He did not have LVH, EF of 55-60%. His right ventricular function was normal although he did have trivial tricuspid and pulmonic regurgitation. This is not audible on exam however. I increased his Lasix to do massive edema in his legs well above his knees I think in early July. He was also drinking an excessive amount of water at the time. 07/15/16; missed his appointment last week because of the Labor Day holiday on Monday. He could not get another appointment later in the week. Started to feel the wrap digging in superiorly so we remove the  top half and the bottom half of his wrap. He has extensive erythema and blistering superiorly in the left leg. Very tender. Very swollen. Edema in his foot with leaking edema fluid. He has not been systemically unwell 07/22/16; the area on the left leg laterally required some debridement. The medial wounds look more stable. His wrap injury wounds appear to have healed. Edema and his foot is better, weeping edema is also better. He tells me he is meeting with the supplier of the external compression pumps at work 08/05/16; the patient was on vacation last week in [facility]. His wrap is been on for an extended period of time. Also over the weekend he developed an extensive area of tender erythema across his anterior medial thigh. He took to doxycycline yesterday that he had leftover from a previous prescription. The patient complains of weeping edema coming out of his toes 08/08/16; I saw this patient on 10/2. He was tender across his anterior thigh. I put him on doxycycline. He returns today in follow-up. He does not have any open wounds on his lower leg, he still has edema weeping into his toes. 08/12/16; patient was seen back urgently today to  follow-up for his extensive left thigh cellulitis/erysipelas. He comes back with a lot less swelling and erythema pain is much better. I believe I gave him Augmentin and Cipro. His wrap was cut down as he stated a roll down his legs. He developed blistering above the level of the wrap that remained. He has 2 open blisters and 1 intact. 08/19/16; patient is been doing his primary doctor who is increased his Lasix from 40-80 once a day or 80 already has less edema. Cellulitis has remained improved in the left thigh. 2 open areas on the posterior left calf 08/26/16; he returns today having new open blisters on the anterior part of his left leg. He has his compression pumps but is not yet been shown how to use some vital representative from the supplier. 09/02/16 patient returns today with no open wounds on the left leg. Some maceration in his plantar toes 09/10/2016 -- Dr. Leanord Hawking had recently discharged him on 09/02/2016 and he has come right back with redness swelling and some open ulcers on his left lower extremity. He says this was caused by trying to apply his compression stockings and he's been unable to use this and has not been able to use his lymphedema pumps. He had some doxycycline leftover and he has started on this a few days ago. 09/16/16; there are no open wounds on his leg on the left and no evidence of cellulitis. 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 He does not complain of symptoms here. I am not clear use using his external compression pumps. 09/23/16; I have not seen this patient in 2 weeks. He canceled his appointment 10 days ago as he was going on vacation. He tells me that on Monday he noticed a large area on his posterior left leg which is been draining copiously and is reopened into a large wound. He is been using ABDs and the external part of his juxtalite, according to our nurse this was not on properly. 10/07/16;  Still a substantial area on the posterior left leg. Using silver alginate 10/14/16; in general better although there is still open area which looks healthy. Still using silver alginate. He reminds me that this happen before he left for [name]. T oday while  he was showering in the morning. He had been using his juxtalite's 10/21/16; the area on his posterior left leg is fully epithelialized. However he arrives today with a large area of tender erythema in his medial and posterior left thigh just above the knee. I have marked the area. Once again he is reluctant to consider hospitalization. I treated him with oral antibiotics in the past for a similar situation with resolution I think with doxycycline however this area it seems more extensive to me. He is not complaining of fever but does have chills and says states he is thirsty. His blood sugar today was in the 140s at home 10/25/16 the area on his posterior left leg is fully epithelialized although there is still some weeping edema. The large area of tenderness and erythema in his medial and posterior left thigh is a lot less tender although there is still a lot of swelling in this thigh. He states he feels a lot better. He is on doxycycline and Augmentin that I started last week. This will continued until Tuesday, December 26. I have ordered a duplex ultrasound of the left thigh rule out DVT whether there is an abscess something that would need to be drained I would also like to know. 11/01/16; he still has weeping edema from a not fully epithelialized area on his left posterior calf. Most of the rest of this looks a lot better. He has completed his antibiotics. His thigh is a lot better. Duplex ultrasound did not show a DVT in the thigh 11/08/16; he comes in today with more Denuded surface epithelium from the posterior aspect of his calf. There is no real evidence of cellulitis. The superior aspect of his wrap appears to have put quite an  indentation in his leg just below the knee and this may have contributed. He does not complain of pain or fever. We have been using silver alginate as the primary dressing. The area of cellulitis in the right thigh has totally resolved. He has been using his compression stockings once a week 11/15/16; the patient arrives today with more loss of epithelium from the posterior aspect of his left calf. He now has a fairly substantial wound in this area. The reason behind this deterioration isn't exactly clear although his edema is not well controlled. He states he feels he is generally more swollen systemically. He is not complaining of chest pain shortness of breath fever. T me he has an appointment with his primary physician in early February. He is on 80 mg of oral ells Lasix a day. He claims compliance with the external compression pumps. He is not having any pain in his legs similar to what he has with his recurrent cellulitis 11/22/16; the patient arrives a follow-up of his large area on his left lateral calf. This looks somewhat better today. He came in earlier in the week for a dressing change since I saw him a week ago. He is not complaining of any pain no shortness of breath no chest pain 11/28/16; the patient arrives for follow-up of his large area on the left lateral calf this does not look better. In fact it is larger weeping edema. The surface of the wound does not look too bad. We have been using silver alginate although I'm not certain that this is a dressing issue. 12/05/16; again the patient follows up for a large wound on the left lateral and left posterior calf this does not look better. There continues to be weeping  edema necrotic surface tissue. More worrisome than this once again there is erythema below the wound involving the distal Achilles and heel suggestive of cellulitis. He is on his feet working most of the day of this is not going well. We are changing his dressing twice a week to  facilitate the drainage. 12/12/16; not much change in the overall dimensions of the large area on the left posterior calf. This is very inflamed looking. I gave him an. Doxycycline last week does not really seem to have helped. He found the wrap very painful indeed it seems to of dog into his legs superiorly and perhaps around the heel. He came in early today because the drainage had soaked through his dressings. 12/19/16- patient arrives for follow-up evaluation of his left lower extremity ulcers. He states that he is using his lymphedema pumps once daily when there is "no drainage". He admits to not using his lipedema pumps while under current treatment. His blood sugars have been consistently between 150-200. 12/26/16; the patient is not using his compression pumps at home because of the wetness on his feet. I've advised him that I think it's important for him to use this daily. He finds his feet too wet, he can put a plastic bag over his legs while he is in the pumps. Otherwise I think will be in a vicious circle. We are using silver alginate to the major area on his left posterior calf 01/02/17; the patient's posterior left leg has further of all into 3 open wounds. All of them covered with a necrotic surface. He claims to be using his compression pumps once a day. His edema control is marginal. Continue with silver alginate 01/10/17; the patient's left posterior leg actually looks somewhat better. There is less edema, less erythema. Still has 3 open areas covered with a necrotic surface requiring debridement. He claims to be using his compression pumps once a day his edema control is better 01/17/17; the patient's left posterior calf look better last week when I saw him and his wrap was changed 2 days ago. He has noted increasing pain in the left heel and arrives today with much larger wounds extensive erythema extending down into the entire heel area especially tender medially. He is not  systemically unwell CBGs have been controlled no fever. Our intake nurse showed me limegreen drainage on his AVD pads. 01/24/17; his usual this patient responds nicely to antibiotics last week giving him Levaquin for presumed Pseudomonas. The whole entire posterior part of his leg is much better much less inflamed and in the case of his Achilles heel area much less tender. He has also had some epithelialization posteriorly there are still open areas here and still draining but overall considerably better 01/31/17- He has continue to tolerate the compression wraps. he states that he continues to use the lymphedema pumps daily, and can increase to twice daily on the weekends. He is voicing no complaints or concerns regarding his LLE ulcers 02/07/17-he is here for follow-up evaluation. He states that he noted some erythema to the left medial and anterior thigh, which he states is new as of yesterday. He is concerned about recurrent cellulitis. He states his blood sugars have been slightly elevated, this morning in the 180s 02/14/17; he is here for follow-up evaluation. When he was last here there was erythema superiorly from his posterior wound in his anterior thigh. He was prescribed Levaquin however a culture of the wound surface grew MRSA over the phone I changed  him to doxycycline on Monday and things seem to be a lot better. 02/24/17; patient missed his appointment on Friday therefore we changed his nurse visit into a physician visit today. Still using silver alginate on the large area of the posterior left thigh. He isn't new area on the dorsal left second toe 03/03/17; actually better today although he admits he has not used his external compression pumps in the last 2 days or so because of work responsibilities over the weekend. 03/10/17; continued improvement. External compression pumps once a day almost all of his wounds have closed on the posterior left calf. Better edema control 03/17/17; in general  improved. He still has 3 small open areas on the lateral aspect of his left leg however most of the area on the posterior part of his leg is epithelialized. He has better edema control. He has an ABD pad under his stocking on the right anterior lower leg although he did not let us look at that today. 03/24/17; patient arrives back in clinic today with no open areas however there are areas on the posterior left calf and anterior left calf that are less than 100% epithelialized. His edema is well controlled in the left lower leg. There is some pitting edema probably lymphedema in the left upper thigh. He uses compression pumps at home once per day. I tried to get him to do this twice a day although he is very reticent. 04/01/2017 -- for the last 2 days he's had significant redness, tenderness and weeping and came in for an urgent visit today. 04/07/17; patient still has 6 more days of doxycycline. He was seen by Dr. Meyer Russel last Wednesday for cellulitis involving the posterior aspect, lateral aspect of his Involving his heel. For the most part he is better there is less erythema and less weeping. He has been on his feet for 12 hours 2 over the weekend. Using his compression pumps once a day 04/14/17 arrives today with continued improvement. Only one area on the posterior left calf that is not fully epithelialized. He has intense bilateral venous inflammation associated with his chronic venous insufficiency disease and secondary lymphedema. We have been using silver alginate to the left posterior calf wound In passing he tells [place] today that the right leg but we have not seen in quite some time has an open area on it but he doesn't want [place] to look at this today states he will show this to [place] next week. 04/21/17; there is no open area on his left leg although he still reports some weeping edema. He showed [place] his right leg today which is the first time we've seen this leg in a long time. He has a large area of  open wound on the right leg anteriorly healthy granulation. Quite a bit of swelling in the right leg and some degree of venous inflammation. He told us about the right leg in passing last week but states that deterioration in the right leg really only happened over the weekend 04/28/17; there is no open area on the left leg although there is an irritated part on the posterior which is like a wrap injury. The wound on the right leg which was new from last week at least to [place] is a lot better. 05/05/17; still no open area on the left leg. Patient is using his new compression stocking which seems to be doing a good job of controlling the edema. He states he is using his compression pumps once per  day. The right leg still has an open wound although it is better in terms of surface area. Required debridement. A lot of pain in the posterior right Achilles marked tenderness. Usually this type of presentation this patient gives concern for an active cellulitis 05/12/17; patient arrives today with his major wound from last week on the right lateral leg somewhat better. Still requiring debridement. He was using his compression stocking on the left leg however that is reopened with superficial wounds anteriorly he did not have an open wound on this leg previously. He is still using his juxta light's once daily at night. He cannot find the time to do this in the morning as he has to be at work by 7 AM 05/19/17; right lateral leg wound looks improved. No debridement required. The concerning area is on the left posterior leg which appears to almost have a subcutaneous hemorrhagic component to it. We've been using silver alginate to all the wounds 05/26/17; the right lateral leg wound continues to look improved. However the area on the left posterior calf is a tightly adherent surface. [place] using silver alginate. Because of the weeping edema in his legs there is very little good alternatives. 06/02/17; the patient left  here last week looking quite good. Major wound on the left posterior calf and a small one on the right lateral calf. Both of these look satisfactory. He tells me that by Wednesday he had noted increased pain in the left leg and drainage. He called on Thursday and Friday to get an appointment here but we were blocked. He did not go to urgent care or his primary physician. He thinks he had a fever on Thursday but did not actually take his temperature. He has not been using his compression pumps on the left leg because of pain. I advised him to go to the emergency room today for IV antibiotics for stents of left leg cellulitis but he has refused I have asked him to take 2 days off work to keep his leg elevated and he has refused this as well. In view of this I'm going to call him and Augmentin and doxycycline. 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 However after a detailed discussion regarding the risk benefits and alternatives he refuses to get admitted to the hospital. With no other recourse I will continue him on oral antibiotics as before and hopefully he'll have his infectious disease consultation this week. 06/16/2017 -- the patient was seen today by the nurse practitioner at infectious disease Ms. Dixon. 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 He has also been advise Lotrimin cream locally. she also had wise IV antibiotics for his condition if it flares up 06/23/17; patient arrives today with drainage bilaterally although the remaining wound on the left posterior calf after  cleaning up today "highlighter yellow drainage" did not look too bad. Unfortunately he has had breakdown on the right anterior leg [previously this leg had not been open and he is using a black stocking] he went to see infectious disease and is been  put on clindamycin 150 mg daily, I did not verify the dose although I'm not familiar with using clindamycin in this dosing range, perhaps for prophylaxisoo 06/27/17; I brought this patient back today to follow-up on the wound deterioration on the right lower leg together with surrounding cellulitis. I started him on doxycycline 4 days ago. This area looks better however he comes in today with intense cellulitis on the medial part of his left thigh. This is not have a wound in this area. Extremely tender. We've been using silver alginate to the wounds on the right lower leg left lower leg with bilateral 4 layer compression he is using his external compression pumps once a day 07/04/17; patient's left medial thigh cellulitis looks better. He has not been using his compression pumps as his insert said it was contraindicated with cellulitis. His right leg continues to make improvements all the wounds are still open. We only have one remaining wound on the left posterior calf. Using silver alginate to all open areas. He is on doxycycline which I started a week ago and should be finishing I gave him Augmentin after Thursday's visit for the severe cellulitis on the left medial thigh which fortunately looks better 07/14/17; the patient's left medial thigh cellulitis has resolved. The cellulitis in his right lower calf on the right also looks better. All of his wounds are stable to improved we've been using silver alginate he has completed the antibiotics I have given him. He has clindamycin 150 mg once a day prescribed by infectious disease for prophylaxis, I've advised him to start this now. We have been using bilateral Unna boots over silver alginate to the wound  areas 07/21/17; the patient is been to see infectious disease who noted his recurrent problems with cellulitis. He was not able to tolerate prophylactic clindamycin therefore he is on amoxicillin 500 twice a day. He also had a second daily dose of Lasix added By Dr. Oneta Rack but he is not taking this. Nor is he being completely compliant with his compression pumps a especially not this week. He has 2 remaining wounds one on the right posterior lateral lower leg and one on the left posterior medial lower leg. 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 The patient has Unna boots bilaterally. 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 He has bilateral Unna boots for compression and silver alginate to his wounds. Arrives today with his legs looking as good as I have seen him in quite some time. Not surprisingly his wounds look better as well with improvement on the right lateral leg venous insufficiency wound and also the left medial leg. He is still using the compression pumps once a day 08/11/17; both legs appear to be doing better wounds on the right lateral and left medial legs look better. Skin on the right leg quite good. He is been using silver alginate as the primary dressing. I'm going to use Anasept gel calcium alginate and maintain all the secondary dressings 08/18/17; the patient continues to actually do quite well. The area on his right lateral leg is just about closed the left medial also looks better although it is still moist in this area. His edema is well controlled we have been using Anasept gel with calcium alginate and the usual secondary  dressings, 4 layer compression and once daily use of his compression pumps "always been able to manage 09/01/17; the patient  continues to do reasonably well in spite of his trip to [place]. The area on the right lateral leg is epithelialized. Left is much better but still open. He has more edema and more chronic erythema on the left leg [venous inflammation] 09/08/17; he arrives today with no open wound on the right lateral leg and decently controlled edema. Unfortunately his left leg is not nearly as in his good situation as last week.he apparently had increasing edema starting on Saturday. He edema soaked through into his foot so used a plastic bag to walk around his home. The area on the medial right leg which was his open area is about the same however he has lost surface epithelium on the left lateral which is new and he has significant pain in the Achilles area of the left foot. He is already on amoxicillin chronically for prophylaxis of cellulitis in the left leg 09/15/17; he is completed a week of doxycycline and the cellulitis in the left posterior leg and Achilles area is as usual improved. He still has a lot of edema and fluid soaking through his dressings. There is no open wound on the right leg. He saw infectious disease NP today 09/22/17;As usual 1 we transition him from our compression wraps to his stockings things did not go well. He has several small open areas on the right leg. He states this was caused by the compression wrap on his skin although he did not wear this with the stockings over them. He has several superficial areas on the left leg medially laterally posteriorly. He does not have any evidence of active cellulitis especially involving the left Achilles [facility] [place] He states he isn't attempting to get an appointment with a [facility] objects wound center there to change his dressings. I am not completely certain whether this will work 10/06/17; the patient came in on Friday for a nurse visit and the nurse reported that his legs actually look  quite good. He arrives in clinic today for his regular follow-up visit. He has a new wound on his left third toe over the PIP probably caused by friction with his footwear. He has small areas on the left leg and a very superficial but epithelialized area on the right anterior lateral lower leg. Other than that his legs look as good as I've seen him in quite some time. We have been using silver alginate Review of systems; no chest pain no shortness of breath other than this a 10 point review of systems negative 10/20/17; seen by Dr. Meyer RusselBritto last week. He had taken some antibiotics [doxycycline] that he had left over. Dr. Meyer RusselBritto thought he had candida infection and declined to give him further antibiotics. He has a small wound remaining on the right lateral leg several areas on the left leg including a larger area on the left posterior several left medial and anterior and a small wound on the left lateral. The area on the left dorsal third toe looks a lot better. ROS; Gen.; no fever, respiratory no cough no sputum Cardiac no chest pain other than this 10 point review of system is negative 10/30/17; patient arrives today having fallen in the bathtub 3 days ago. It took him a while to get up. He has pain and maceration in the wounds on his left leg which have  deteriorated. He has not been using his pumps he also has some maceration on the right lateral leg. 11/03/17; patient continues to have weeping edema especially in the left leg. This saturates his dressings which were just put on on 12/27. As usual the doxycycline seems to take care of the cellulitis on his lower leg. He is not complaining of fever, chills, or other systemic symptoms. He states his leg feels a lot better on the doxycycline I gave him empirically. He also apparently gets injections at his primary doctor's officeo Rocephin for cellulitis prophylaxis. I didn't ask him about his compression pump compliance today I think that's probably  marginal. Arrives in the clinic with all of his dressings primary and secondary macerated full of fluid and he has bilateral edema 11/10/17; the patient's right leg looks some better although there is still a cluster of wounds on the right lateral. The left leg is inflamed with almost circumferential skin loss medially to laterally although we are still maintaining anteriorly. He does not have overt cellulitis there is a lot of drainage. He is not using compression pumps. We have been using silver alginate to the wound areas, there are not a lot of options here 11/17/17; the patient's right leg continues to be stable although there is still open wounds, better than last week. The inflammation in the left leg is better. Still loss of surface layer epithelium especially posteriorly. There is no overt cellulitis in the amount of edema and his left leg is really quite good, tells me he is using his compression pumps once a day. 11/24/17; patient's right leg has a small superficial wound laterally this continues to improve. The inflammation in the left leg is still improving however we have continuous surface layer epithelial loss posteriorly. There is no overt cellulitis in the amount of edema in both legs is really quite good. He states he is using his compression pumps on the left leg once a day for 5 out of 7 days 12/01/17; very small superficial areas on the right lateral leg continue to improve. Edema control in both legs is better today. He has continued loss of surface epithelialization and left posterior calf although I think this is better. We have been using silver alginate with large number of absorptive secondary dressings 4 layer on the left Unna boot on the right at his request. He tells me he is using his compression pumps once a day 12/08/17; he has no open area on the right leg is edema control is good here. On the left leg however he has marked erythema and tenderness breakdown of skin. He has  what appears to be a wrap injury just distal to the popliteal fossa. 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 There was swelling erythema and breakdown in the right leg. He has much worse skin breakdown in the left leg as well multiple open areas medially and posteriorly as well as laterally. He tells me he has been using his compression pumps but tells me he feels that the drainage out of his leg is worse when he uses a compression pumps. T be fair to him he is been saying this o for a while however I don't know that I  have really been listening to this. I wonder if the compression pumps are working properly 12/22/17;. Once again he arrives with severe erythema, weeping edema from the left greater than right leg. Noncompliance with compression pumps. New this visit he is complaining of pain on the lateral aspect of the right leg and the medial aspect of his right thigh. He apparently saw his cardiologist Dr. [name] who was ordered an echocardiogram area and I think this is a step in the right direction 12/25/17; started his doxycycline Monday night. There is still intense erythema of the right leg especially in the anterior thigh although there is less tenderness. The erythema around the wound on the right lateral calf also is less tender. He still complaining of pain in the left heel. His wounds are about the same right lateral left medial left lateral. Superficial but certainly not close to closure. He denies being systemically unwell no fever chills no abdominal pain no diarrhea 12/29/17; back in follow-up of his extensive right calf and right thigh cellulitis. I added amoxicillin to cover possible doxycycline resistant  strep. This seems to of done the trick he is in much less pain there is much less erythema and swelling. He has his echocardiogram at 11:00 this morning. X-ray of the left heel was also negative. 01/05/18; the patient arrived with his edema under much better control. Now that he is retired he is able to use his compression pumps daily and sometimes twice a day per the patient. He has a wound on the right leg the lateral wound looks better. Area on the left leg also looks a lot better. He has no evidence of cellulitis in his bilateral thighs I had a quick peak at his echocardiogram. He is in normal ejection fraction and normal left ventricular function. He has moderate pulmonary hypertension moderately reduced right ventricular function. One would have to wonder about chronic sleep apnea although he says he doesn't snore. He'll review the echocardiogram with his cardiologist. 01/12/18; the patient arrives with the edema in both legs under exemplary control. He is using his compression pumps daily and sometimes twice daily. His wound on the right lateral leg is just about closed. He still has some weeping areas on the posterior left calf and lateral left calf although everything is just about closed here as well. I have spoken with Aldean Baker who is the patient's nurse practitioner and infectious disease. She was concerned that the patient had not understood that the parenteral penicillin injections he was receiving for cellulitis prophylaxis was actually benefiting him. I don't think the patient actually saw that I would tend to agree we were certainly dealing with less infections although he had a serious one last month. 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 He is healed. He'll be placed in juxtalite compression wraps and increase his lymphedema pumps to twice daily. We will follow up again next week to ensure there are no issues with the new  regiment. 01/20/18-he is here for evaluation of bilateral lower extremity weeping edema. Yesterday he was placed in compression wrap to the right lower extremity and compression stocking to left lower shrubbery. He states he uses lymphedema pumps last night and again this morning and noted a blister to the left lower extremity. On exam he was noted to have drainage to the right lower extremity. He will be placed in Unna boots bilaterally and follow-up next week 01/26/18; patient was actually discharged a week ago to his  own juxta light stockings only to return the next day with bilateral lower extremity weeping edema.he was placed in bilateral Unna boots. He arrives today with pain in the back of his left leg. There is no open area on the right leg however there is a linear/wrap injury on the left leg and weeping edema on the left leg posteriorly. I spoke with infectious disease about 10 days ago. They were disappointed that the patient elected to discontinue prophylactic intramuscular penicillin shots as they felt it was particularly beneficial in reducing the frequency of his cellulitis. I discussed this with the patient today. He does not share this view. He'll definitely need antibiotics today. Finally he is traveling to [place] and trauma leaving this Saturday and returning a week later and he does not travel with his pumps. He is going by car 01/30/18; patient was seen 4 days ago and brought back in today for review of cellulitis in the left leg posteriorly. I put him on amoxicillin this really hasn't helped as much as I might like. He is also worried because he is traveling to [facility] by car. Finally we will be rewrapping him. There is no open area on the right leg over his left leg has multiple weeping areas as usual 02/09/18; The same wrap on for 10 days. He did not pick up the last doxycycline I prescribed for him. He apparently took 4 days worth he already had. There is nothing open on  his right leg and the edema control is really quite good. He's had damage in the left leg medially and laterally especially probably related to the prolonged use of Unna boots 02/12/18; the patient arrived in clinic today for a nurse visit/wrap change. He complained of a lot of pain in the left posterior calf. He is taking doxycycline that I previously prescribed for him. Unfortunately even though he used his stockings and apparently used to compression pumps twice a day he has weeping edema coming out of the lateral part of his right leg. This is coming from the lower anterior lateral skin area. 02/16/18; the patient has finished his doxycycline and will finish the amoxicillin 2 days. The area of cellulitis in the left calf posteriorly has resolved. He is no longer having any pain. He tells me he is using his compression pumps at least once a day sometimes twice. 02/23/18; the patient finished his doxycycline and Amoxil last week. On Friday he noticed a small erythematous circle about the size of a quarter on the left lower leg just above his ankle. This rapidly expanded and he now has erythema on the lateral and posterior part of the thigh. This is bright red. Also has an area on the dorsal foot just above his toes and a tender area just below the left popliteal fossa. He came off his prophylactic penicillin injections at his own insistence one or 2 months ago. This is obviously deteriorated since then 03/02/18; patient is on doxycycline and Amoxil. Culture I did last week of the weeping area on the back of his left calf grew group B strep. I have therefore renewed the amoxicillin 500 3 times a day for a further week. He has not been systemically unwell. Still complaining of an area of discomfort right under his left popliteal fossa. There is no open wound on the right leg. He tells me that he is using his pumps twice a day on most days 03/09/18; patient arrives in clinic today completing his amoxicillin  today.  The cellulitis on his left leg is better. Furthermore he tells me that he had intramuscular penicillin shots that his primary care office today. However he also states that the wrap on his right leg fell down shortly after leaving clinic last week. He developed a large blister that was present when he came in for a nurse visit later in the week and then he developed intense discomfort around this area.He tells me he is using his compression pumps 03/16/18; the patient has completed his doxycycline. The infectious part of this/cellulitis in the left heel area left popliteal area is a lot better. He has 2 open areas on the right calf. Still areas on the left calf but this is a lot better as well. 03/24/18; the patient arrives complaining of pain in the left popliteal area again. He thinks some of this is wrap injury. He has no open area on the right leg and really no open area on the left calf either except for the popliteal area. He claims to be compliant with the compression pumps 03/31/18; I gave him doxycycline last week because of cellulitis in the left popliteal area. This is a lot better although the surface epithelium is denuded off and response to this. He arrives today with uncontrolled edema in the right calf area as well as a fingernail injury in the right lateral calf. There is only a few open areas on the left 04/06/18; I gave him amoxicillin doxycycline over the last 2 weeks that the amoxicillin should be completing currently. He is not complaining of any pain or systemic symptoms. The only open areas see has is on the right lateral lower leg paradoxically I cannot see anything on the left lower leg. He tells me he is using his compression pumps twice a day on most days. Silver alginate to the wounds that are open under 4 layer compression 04/13/18; he completed antibiotics and has no new complaints. Using his compression pumps. Silver alginate that anything that's opened 04/20/18; he is  using his compression pumps religiously. Silver alginate 4 layer compression anything that's opened. He comes in today with no open wounds on the left leg but 3 on the right including a new one posteriorly. He has 2 on the right lateral and one on the right posterior. He likes Unna boots on the right leg for reasons that aren't really clear we had the usual 4 layer compression on the left. It may be necessary to move to the 4 layer compression on the right however for now I left them in the Unna boots 04/27/18; he is using his compression pumps at least once a day. He has still the wounds on the right lateral calf. The area right posteriorly has closed. He does not have an open wound on the left under 4 layer compression however on the dorsal left foot just proximal to the toes and the left third toe 2 small open areas were identified 05/11/18; he has not uses compression pumps. The areas on the right lateral calf have coalesced into one large wound necrotic surface. On the left side he has one small wound anteriorly however the edema is now weeping out of a large part of his left leg. He says he wasn't using his pumps because of the weeping fluid. I explained to him that this is the time he needs to pump more 05/18/18; patient states he is using his compression pumps twice a day. The area on the right lateral large wound albeit superficial.  On the left side he has innumerable number of small new wounds on the left calf particularly laterally but several anteriorly and medially. All these appear to have healthy granulated base these look like the remnants of blisters however they occurred under compression. The patient arrives in clinic today with his legs somewhat better. There is certainly less edema, less multiple open areas on the left calf and the right anterior leg looks somewhat better as well superficial and a little smaller. However he relates pain and erythema over the last 3-4 days in the thigh  and I looked at this today. He has not been systemically unwell no fever no chills no change in blood sugar values 05/25/18; comes in today in a better state. The severe cellulitis on his left leg seems better with the Keflex. Not as tender. He has not been systemically unwell Hard to find an open wound on the left lower leg using his compression pumps twice a day The confluent wounds on his right lateral calf somewhat better looking. These will ultimately need debridement I didn't do this today. 06/01/18; the severe cellulitis on the left anterior thigh has resolved and he is completed his Keflex. There is no open wound on the left leg however there is a superficial excoriation at the base of the third toe dorsally. Skin on the bottom of his left foot is macerated looking. The left the wounds on the lateral right leg actually looks some better although he did require debridement of the top half of this wound area with an open curet 06/09/18 on evaluation today patient appears to be doing poorly in regard to his right lower extremity in particular this appears to likely be infected he has very thick purulent discharge along with a bright green tent to the discharge. This makes me concerned about the possibility of pseudomonas. He's also having increased discomfort at this point on evaluation. Fortunately there does not appear to be any evidence of infection spreading to the other location at this time. 06/16/18 on evaluation today patient appears to actually be doing fairly well. His ulcer has actually diminished in size quite significantly at this point which is good news. Nonetheless he still does have some evidence of infection he did see infectious disease this morning before coming here for his appointment. I did review the results of their evaluation and their note today. They did actually have him discontinue the Cipro and initiate treatment with linezolid at this time. He is doing this for the  next seven days and they recommended a follow-up in four months with them. He is the keep a log of the need for intermittent antibiotic therapy between now and when he falls back up with infectious disease. This will help them gaze what exactly they need to do to try and help them out. 06/23/18; the patient arrives today with no open wounds on the left leg and left third toe healed. He is been using his compression pumps twice a day. On the right lateral leg he still has a sizable wound but this is a lot better than last time I saw this. 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 Has been using silver alginate under 4 layer compression 06/30/18; the only open wound he has is on the right lateral leg and this looks healthy. No debridement is required. We have been using silver alginate. He does not  have an open wound on the left leg. There is apparently some drainage from the dorsal proximal third toe on the left although I see no open wound here. 07/03/18 on evaluation today patient was actually here just for a nurse visit rapid change. However when he was here on Wednesday for his rat change due to having been healed on the left and then developing blisters we initiated the wrap again knowing that he would be back today for [place] to reevaluate and see were at. Unfortunately he has developed some cellulitis into the proximal portion of his right lower extremity even into the region of his thigh. 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 He was placed on one as what at that point. Nonetheless he is done with that and has been tolerating it well otherwise. Doxycycline which in the past really did not seem to be effective for him. Nonetheless I think the best option may be for [place] to definitely reinitiate the antibiotics for a longer period of time. 07/07/18; since I  last saw this patient a week ago he has had a difficult time. At that point he did not have an open wound on his left leg. We transitioned him into juxta light stockings. He was apparently in the clinic the next day with blisters on the left lateral and left medial lower calf. He also had weeping edema fluid. He was put back into a compression wrap. He was also in the clinic on Friday with intense erythema in his right thigh. Per the patient he was started on Bactrim however that didn't work at all in terms of relieving his pain and swelling. He has taken 3 doxycycline that he had left over from last time and that seems to of helped. He has blistering on the right thigh as well. 07/14/18; the erythema on his right thigh has gotten better with doxycycline that he is finishing. 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 Presumably this cellulitis in the thigh was not related to that although I think this is a bit concerning going forward. He still has an area on the right lateral calf the blister on the right medial calf just below the knee that was discussed above. On the left 2 small open areas left medial and left lateral. Edema control is adequate. He is using his compression pumps twice a day 07/20/18; continued improvement in the condition of both legs especially the edema in his bilateral thighs. He tells me he is been losing weight through a combination of diet and exercise. He is using his compression pumps twice a day. So overall she made to the remaining wounds 07/27/2018; continued improvement in condition of both legs. His edema is well controlled. The area on the right lateral leg is just about closed he had one blisters show up on the medial left upper calf. We have him in 4 layer compression. He is going on a 10-day trip to [place]. He will be driving. He wants to wear Unna boots because of  the lessening amount of constriction. He will not use compression pumps while he is away 08/05/18 on evaluation today patient actually appears to be doing decently well all things considered in regard to his bilateral lower extremities. The worst ulcer is actually only posterior aspect of his left lower extremity with a four  layer compression wrap cut into his leg a couple weeks back. He did have a trip and actually had [employer] for the trip that he is worn since he was last here. Nonetheless he feels like the [employer] actually do better for him his swelling is up a little bit but he also with his trip was not taking his Lasix on a regular set schedule like he was supposed to be. He states that obviously the reason being that he cannot drive and keep going without having to urinate too frequently which makes it difficult. He did not have his pumps with him while he was away either which I think also maybe playing a role here too. 08/13/2018; the patient only has a small open wound on the right lateral calf which is a big improvement in the last month or 2. He also has the area posteriorly just below the posterior fossa on the left which I think was a wrap injury from several weeks ago. He has no current evidence of cellulitis. He tells me he is back into his compression pumps twice a day. He also tells me that while he was at the laundromat somebody stole a section of his extremitease stockings 08/20/2018; back in the clinic with a much improved state. He only has small areas on the right lateral mid calf which is just about healed. This was is more substantial area for quite a prolonged period of time. He has a small open area on the left anterior tibia. The area on the posterior calf just below the popliteal fossa is closed today. He is using his compression pumps twice a day 08/28/2018; patient has no open wound on the right leg. He has a smattering of open areas on the calf with some  weeping lymphedema. More problematically than that it looks as though his wraps of slipped down in his usual he has very angry upper area of edema just below the right medial knee and on the right lateral calf. He has no open area on his feet. The patient is traveling to [name] next week. I will send him in an antibiotic. We will continue to wrap the right leg. We ordered extremitease stockings for him last week and I plan to transition the right leg to a stocking when he gets home which will be in 10 days time. As usual he is very reluctant to take his pumps with him when he travels 09/07/2018; patient returns from [facility]. He shows me a picture of his left leg in the mid part of his trip last week with intense fire engine erythema. The picture look bad enough I would have considered sending him to the hospital. Instead he went to the wound care center in [facility]. They did not prescribe him antibiotics but he did take some doxycycline he had leftover from a previous visit. I had given him trimethoprim sulfamethoxazole before he left this did not work according to the patient. This is resulted in some improvement fortunately. He comes back with a large wound on the left posterior calf. Smaller area on the left anterior tibia. Denuded blisters on the dorsal left foot over his toes. Does not have much in the way of wounds on the right leg although he does have a very tender area on the right posterior area just below the popliteal fossa also suggestive of infection. He promises me he is back on his pumps twice a day 09/15/2018; the intense  cellulitis in his left lower calf is a lot better. The wound area on the posterior left calf is also so better. However he has reasonably extensive wounds on the dorsal aspect of his second and third toes and the proximal foot just at the base of the toes. There is nothing open on the right leg 09/22/2018; the patient has excellent edema  control in his legs bilaterally. He is using his external compression pumps twice a day. He has no open area on the right leg and only the areas in the left foot dorsally second and third toe area on the left side. He does not have any signs of active cellulitis. 10/06/2018; the patient has good edema control bilaterally. He has no open wound on the right leg. There is a blister in the posterior aspect of his left calf that we had to deal with today. He is using his compression pumps twice a day. There is no signs of active cellulitis. We have been using silver alginate to the wound areas. He still has vulnerable areas on the base of his left first second toes dorsally He has a his extremities stockings and we are going to transition him today into the stocking on the right leg. He is cautioned that he will need to continue to use the compression pumps twice a day. If he notices uncontrolled edema in the right leg he may need to go to 3 times a day. 10/13/2018; the patient came in for a nurse check on Friday he has a large flaccid blister on the right medial calf just below the knee. We unroofed this. He has this and a new area underneath the posterior mid calf which was undoubtedly a blister as well. He also has several small areas on the right which is the area we put his extremities stocking on. 10/19/2018; the patient went to see infectious disease this morning I am not sure if that was a routine follow-up in any case the doxycycline I had given him was discontinued and started on linezolid. He has not started this. It is easy to look at his left calf and the inflammation and think this is cellulitis however he is very tender in the tissue just below the popliteal fossa and I have no doubt that there is infection going on here. He states the problem he is having is that with the compression pumps the edema goes down and then starts walking the wrap falls down. We will see if we can adhere this. He  has 1 or 2 minuscule open areas on the right still areas that are weeping on the posterior left calf, the base of his left second and third toes 10/26/18; back today in clinic with quite of skin breakdown in his left anterior leg. This may have been infection the area below the popliteal fossa seems a lot better however tremendous epithelial loss on the left anterior mid tibia area over quite inexpensive tissue. He has 2 blisters on the right side but no other open wound here. 10/29/2018; came in urgently to see [place] today and we worked him in for review. He states that the 4 layer compression on the right leg caused pain he had to cut it down to roughly his mid calf this caused swelling above the wrap and he has blisters and skin breakdown today. As a result of the pain he has not been using his pumps. Both legs are a lot more edematous and there is a lot of  weeping fluid. 11/02/18; arrives in clinic with continued difficulties in the right leg> left. Leg is swollen and painful. multiple skin blisters and new open areas especially laterally. He has not been using his pumps on the right leg. 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 He is not systemically unwell. 11/09/2018; the patient claims he is being compliant with his pumps. He is finished the doxycycline I gave him last week. Culture I did of the wound on the right lateral leg showed a few very resistant methicillin staph aureus. This was resistant to doxycycline. Nevertheless he states the pain in the leg is a lot better which makes me wonder if the cultured organism was not really what was causing the problem nevertheless this is a very dangerous organism to be culturing out of any wound. His right leg is still a lot larger than the left. He is using an Radio broadcast assistant on this area, he blames a 4-layer compression for causing the original skin breakdown which I doubt is true however I cannot talk him out  of it. We have been using silver alginate to all of these areas which were initially blisters 11/16/2018; patient is being compliant with his external compression pumps at twice a day. Miraculously he arrives in clinic today with absolutely no open wounds. He has better edema control on the left where he has been using 4 layer compression versus wound of wounds on the right and I pointed this out to him. There is no inflammation in the skin in his lower legs which is also somewhat unusual for him. There is no open wounds on the dorsal left foot. He has extremitease stockings at home and I have asked him to bring these in next week. 11/25/18 patient's lower extremity on examination today on the left appears for the most part to be wound free. He does have an open wound on the lateral aspect of the right lower extremity but this is minimal compared to what I've seen in past. He does request that we go ahead and wrap the left leg as well even though there's nothing open just so hopefully it will not reopen in short order. 1/28; patient has superficial open wounds on the right lateral calf left anterior calf and left posterior calf. His edema control is adequate. 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 We have been using silver alginate as the primary dressing. He claims compliance with his compression pumps 2/4; patient has superficial open wounds on numerous areas of his left calf and again one on the left dorsal foot. The areas on the right lateral calf have healed. The cellulitis that I gave him doxycycline for last week is also resolved this was mostly on the left anterior calf just below the tibial tuberosity. His edema looks fairly well-controlled. He tells me he went to see his primary doctor today and had blood work ordered 2/11; once again he has several open areas on the left calf left tibial area. Most of  these are small and appear to have healthy granulation. He does not have anything open on the right. The edema and control in his thighs is pretty good which is usually a good indication he has been using his pumps as requested. 2/18; he continues to have several small areas on the left calf and left tibial area. Most of these are small healthy  granulation. We put him in his stocking on the right leg last week and he arrives with a superficial open area over the right upper tibia and a fairly large area on the right lateral tibia in similar condition. His edema control actually does not look too bad, he claims to be using his compression pumps twice a day 2/25. Continued small areas on the left calf and left tibial area. New areas especially on the right are identified just below the tibial tuberosity and on the right upper tibia itself. There are also areas of weeping edema fluid even without an obvious wound. He does not have a considerable degree of lymphedema but clearly there is more edema here than his skin can handle. He states he is using the pumps twice a day. We have an Unna boot on the right and 4 layer compression on the left. 3/3; he continues to have an area on the right lateral calf and right posterior calf just below the popliteal fossa. There is a fair amount of tenderness around the wound on the popliteal fossa but I did not see any evidence of cellulitis, could just be that the wrap came down and rubbed in this area. He does not have an open area on the left leg however there is an area on the left dorsal foot at the base of the third toe We have been using silver alginate to all wound areas 3/10; he did not have an open area on his left leg last time he was here a week ago. T oday he arrives with a horizontal wound just below the tibial tuberosity and an area on the left lateral calf. He has intense erythema and tenderness in this area. The area is on the right lateral calf and  right posterior calf better than last week. We have been using silver alginate as usual 3/18 - Patient returns with 3 small open areas on left calf, and 1 small open area on right calf, the skin looks ok with no significant erythema, he continues the UNA boot on right and 4 layer compression on left. The right lateral calf wound is closed , the right posterior is small area. we will continue silver alginate to the areas. 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 He is been tolerating the dressing changes without complication. Fortunately she has made excellent improvement in regard to the overall status of his wounds. Unfortunately every time we cease wrapping him he ends up reopening in causing more significant issues at that point. Again I'm unsure of the best direction to take although I think the lymphedema clinic may be appropriate for him. 02/03/19 on evaluation today patient appears to be doing well in regard to the wounds that we saw him for last week unfortunately he has a new area on the proximal portion of his right medial/posterior lower extremity where the wrap somewhat slowed down and caused swelling and a blister to rub and open. Unfortunately this is the only opening that he has on either leg at this point. 02/17/19 on evaluation today patient's bilateral lower extremities appear to be doing well. He still completely healed in regard to the left lower extremity. In regard to the right lower extremity the area where the wrap and slid down and caused the blister still seems to  be slightly open although this is dramatically better than during the last evaluation two weeks ago. I'm very pleased with the way this stands overall. 03/03/19 on evaluation today patient appears to be doing well in regard  to his right lower extremity in general although he did have a new blister open this does not appear to be showing any evidence of active infection at this time. Fortunately there's No fevers, chills, nausea, or vomiting noted at this time. Overall I feel like he is making good progress it does feel like that the right leg will we perform the [employer] seems to do with a bit better than three layer wrap on the left which slid down on him. We may switch to doing bilateral in the book wraps. 5/4; I have not seen Mr. Golembeski in quite some time. According to our case manager he did not have an open wound on his left leg last week. He had 1 remaining wound on the right posterior medial calf. He arrives today with multiple openings on the left leg probably were blisters and/or wrap injuries from Unna boots. I do not think the Unna boot's will provide adequate compression on the left. I am also not clear about the frequency he is using the compression pumps. 03/17/19 on evaluation today patient appears to be doing excellent in regard to his lower extremities compared to last week's evaluation apparently. He had gotten significantly worse last week which is unfortunate. The [employer] wrap on the left did not seem to do very well for him at all and in fact it didn't control his swelling significantly enough he had an additional outbreak. Subsequently we go back to the four layer compression wrap on the left. This is good news. At least in that he is doing better and the wound seem to be killing him. He still has not heard anything from the lymphedema clinic. 03/24/19 on evaluation today patient actually appears to be doing much better in regard to his bilateral lower Trinity as compared to last week when I saw him. Fortunately there's no signs of active infection at this time. He has been tolerating the dressing changes without complication. Overall I'm extremely pleased with the progress and appearance in  general. 04/07/19 on evaluation today patient appears to be doing well in regard to his bilateral lower extremities. His swelling is significantly down from where it was previous. With that being said he does have a couple blisters still open at this point but fortunately nothing that seems to be too severe and again the majority of the larger openings has healed at this time. 04/14/19 on evaluation today patient actually appears to be doing quite well in regard to his bilateral lower extremities in fact I'm not even sure there's anything significantly open at this time at any site. Nonetheless he did have some trouble with these wraps where they are somewhat irritating him secondary to the fact that he has noted that the graph wasn't too close down to the end of this foot in a little bit short as well up to his knee. Otherwise things seem to be doing quite well. 04/21/19 upon evaluation today patient's wound bed actually showed evidence of being completely healed in regard to both lower extremities which is excellent news. There does not appear to be any signs of active infection which is also good news. I'm very pleased in this regard. No fevers, chills, nausea, or vomiting noted at this time. 04/28/19 on evaluation today  patient appears to be doing a little bit worse in regard to both lower extremities on the left mainly due to the fact that when he went infection disease the wrap was not wrapped quite high enough he developed a blister above this. On the right he is a small open area of nothing too significant but again this is continuing to give him some trouble he has been were in the Velcro compression that he has at home. 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 He's been tolerating the dressing changes without complication. Fortunately there's no signs of active infection at this time. No fevers, chills, nausea, or vomiting noted at  this time. We have been trying to get an appointment with her lymphedema clinic in [facility] but unfortunately nobody can get them on phone with not been able to even fax information over the patient likewise is not been able to get in touch with them. Overall I'm not sure exactly what's going on here with to reach out again today. 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 Still having a lot of drainage unfortunately. He tells me especially in the left but even on the right. There's no signs of active infection which is good news we've been using so ratcheted up to this point. 05/19/19 on evaluation today patient actually appears to be doing quite well with regard to his left lower extremity which is great news. Fortunately in regard to the right lower extremity has an issues with his wrap and he subsequently did remove this from what I'm understanding. Nonetheless long story short is what he had rewrapped once he removed it subsequently had maggots underneath this wrap whenever he came in for evaluation today. With that being said they were obviously completely cleaned away by the nursing staff. The visit today which is excellent news. However he does appear to potentially have some infection around the right ankle region where the maggots were located as well. He will likely require anabiotic therapy today. 05/26/19 on evaluation today patient actually appears to be doing much better in regard to his bilateral lower extremities. I feel like the infection is under much better control. With that being said there were maggots noted when the wrap was removed yet again today. Again this could have potentially been left over from previous although at this time there does not appear to be any signs of significant drainage there was obviously on the wrap some drainage as well this contracted gnats or  otherwise. Either way I do not see anything that appears to be doing worse in my pinion and in fact I think his drainage has slowed down quite significantly likely mainly due to the fact to his infection being under better control. 06/02/2019 on evaluation today patient actually appears to be doing well with regard to his bilateral lower extremities there is no signs of active infection at this time which is great news. With that being said he does have several open areas more so on the right than the left but nonetheless these are all significantly better than previously noted. 06/09/2019 on evaluation today patient actually appears to be doing well. His wrap stayed up and he did not cause any problems he had more drainage on the right compared to the left but overall I do not see any major issues at this time which  is great news. 06/16/2019 on evaluation today patient appears to be doing excellent with regard to his lower extremities the only area that is open is a new blister that can have opened as of today on the medial ankle on the left. Other than this he really seems to be doing great I see no major issues at this point. 06/23/2019 on evaluation today patient appears to be doing quite well with regard to his bilateral lower extremities. In fact he actually appears to be almost completely healed there is a small area of weeping noted of the right lower extremity just above the ankle. Nonetheless fortunately there is no signs of active infection at this time which is good news. No fevers, chills, nausea, vomiting, or diarrhea. 8/24; the patient arrived for a nurse visit today but complained of very significant pain in the left leg and therefore I was asked to look at this. Noted that he did not have an open area on the left leg last week nevertheless this was wrapped. The patient states that he is not been able to put his compression pumps on the left leg because of the discomfort. He has not been  systemically unwell 06/30/2019 on evaluation today patient unfortunately despite being excellent last week is doing much worse with regard to his left lower extremity today. In fact he had to come in for a nurse on Monday where his left leg had to be rewrapped due to excessive weeping Dr. Leanord Hawking placed him on doxycycline at that point. Fortunately there is no signs of active infection Systemically at this time which is good news. 07/07/2019 in regard to the patient's wounds today he actually seems to be doing well with his right lower extremity there really is nothing open or draining at this point this is great news. Unfortunately the left lower extremity is given him additional trouble at this time. There does not appear to be any signs of active infection nonetheless he does have a lot of edema and swelling noted at this point as well as blistering all of which has led to a much more poor appearing leg at this time compared to where it was 2 weeks ago when it was almost completely healed. Obviously this is a little discouraging for the patient. He is try to contact the lymphedema clinic in [place] he has not been able to get through to them. 07/14/2019 on evaluation today patient actually appears to be doing slightly better with regard to his left lower extremity ulcers. Overall I do feel like at least at the top of the wrap that we have been placing this area has healed quite nicely and looks much better. The remainder of the leg is showing signs of improvement. Unfortunately in the thigh area he still has an open region on the left and again on the right he has been utilizing just a Band-Aid on an area that also opened on the thigh. Again this is an area that were not able to wrap although we did do an Ace wrap to provide some compression that something that obviously is a little less effective than the compression wraps we have been using on the lower portion of the leg. He does have an  appointment with the lymphedema clinic in [facility] on Friday. 07/21/2019 on evaluation today patient appears to be doing better with regard to his lower extremity ulcers. He has been tolerating the dressing changes without complication. Fortunately there is no signs of active infection at this  time. No fevers, chills, nausea, vomiting, or diarrhea. I did receive the paperwork from the physical therapist at the lymphedema clinic in [place]. Subsequently I signed off on that this morning and sent that back to him for further progression with the treatment plan. 07/28/2019 on evaluation today patient appears to be doing very well with regard to his right lower extremity where I do not see any open wounds at this point. Fortunately he is feeling great as far as that is concerned as well. In regard to the left lower extremity he has been having issues with still several areas of weeping and edema although the upper leg is doing better his lower leg still I think is going require the compression wrap at this time. No fevers, chills, nausea, vomiting, or diarrhea. 08/04/2019 on evaluation today patient unfortunately is having new wounds on the right lower extremity. Again we have been using Unna boot wrap on that side. We switched him to using his juxta lite wrap at home. With that being said he tells me he has been using it although his legs extremely swollen and to be honest really does not appear that he has been. I cannot know that for sure however. Nonetheless he has multiple new wounds on the right lower extremity at this time. Obviously we will have to see about getting this rewrapped for him today. 08/11/2019 on evaluation today patient appears to be doing fairly well with regard to his wounds. He has been tolerating the dressing changes including the compression wraps without complication. He still has a lot of edema in his upper thigh regions bilaterally he is supposed to be seeing the  lymphedema clinic on the 15th of this month once his wraps arrive for the upper part of his legs. 08/18/2019 on evaluation today patient appears to be doing well with regard to his bilateral lower extremities at this point. He has been tolerating the dressing changes without complication. Fortunately there is no signs of active infection which is also good news. He does have a couple weeping areas on the first and second toe of the right foot he also has just a small area on the left foot upper leg and a small area on the left lower leg but overall he is doing quite well in my opinion. He is supposed to be getting his wraps shortly in fact tomorrow and then subsequently is seeing the lymphedema clinic next Wednesday on the 21st. Of note he is also leaving on the 25th to go on vacation for a [place] to the beach. For that reason and since there is some uncertainty about what there can be doing at lymphedema clinic next Wednesday I am get a make an appointment for next Friday here for [place] to see what we need to do for him prior to him leaving for vacation. 10/23; patient arrives in considerable pain predominantly in the upper posterior calf just distal to the popliteal fossa also in the wound anteriorly above the major wound. This is probably cellulitis and he has had this recurrently in the past. He has no open wound on the right side and he has had an Radio broadcast assistant in that area. Finally I note that he has an area on the left posterior calf which by enlarge is mostly epithelialized. This protrudes beyond the borders of the surrounding skin in the setting of dry scaly skin and lymphedema. The patient is leaving for [facility] on Sunday. Per his longstanding pattern, he will not take  his compression pumps with him predominantly out of fear that they will be stolen. He therefore asked that we put a Unna boot back on the right leg. He will also contact the wound care center in [name] to see if they can  change his dressing in the mid week. 11/3; patient returned from his vacation to [place] [facility]. He was seen on 1 occasion at their wound care center. They did a 2 layer compression system as they did not have our 4-layer wrap. I am not completely certain what they put on the wounds. They did not change the Unna boot on the right. The patient is also seeing a lymphedema specialist physical therapist in [place]. It appears that he has some compression sleeve for his thighs which indeed look quite a bit better than I am used to seeing. He pumps over these with his external compression pumps. 11/10; the patient has a new wound on the right medial thigh otherwise there is no open areas on the right. He has an area on the left leg posteriorly anteriorly and medially and an area over the left second toe. We have been using silver alginate. He thinks the injury on his thigh is secondary to friction from the compression sleeve he has. 11/17; the patient has a new wound on the right medial thigh last week. He thinks this is because he did not have a underlying stocking for his thigh juxta lite apparatus. He now has this. The area is fairly large and somewhat angry but I do not think he has underlying cellulitis. He has a intact blister on the right anterior tibial area. Small wound on the right great toe dorsally Small area on the medial left calf. 11/30; the patient does not have any open areas on his right leg and we did not take his juxta lite stocking off. However he states that on Friday his compression wrap fell down lodging around his upper mid calf area. As usual this creates a lot of problems for him. He called urgently today to be seen for a nurse visit however the nurse visit turned into a provider visit because of extreme erythema and pain in the left anterior tibia extending laterally and posteriorly. The area that is problematic is extensive 10/06/2019 upon evaluation today patient actually  appears to be doing poorly in regard to his left lower extremity. He Dr. Leanord Hawking did place him on doxycycline this past Monday apparently due to the fact that he was doing much worse in regard to this left leg. Fortunately the doxycycline does seem to be helping. Unfortunately we are still having a very difficult time getting his edema under any type of control in order to anticipate discharge at some point. The only way were really able to control his lymphedema really is with compression wraps and that has only even seemingly temporary. He has been seeing a lymphedema clinic they are trying to help in this regard but still this has been somewhat frustrating in general for the patient. 10/13/19 on evaluation today patient appears to be doing excellent with regard to his right lower extremity as far as the wounds are concerned. His swelling is still quite extensive unfortunately. He is still having a lot of drainage from the thigh areas bilaterally which is unfortunate. He's been going to lymphedema clinic but again he still really does not have this edema under control as far as his lower extremities are concern. With regard to his left lower extremity this seems  to be improving and I do believe the doxycycline has been of benefit for him. He is about to complete the doxycycline. 10/20/2019 on evaluation today patient appears to be doing poorly in regard to his bilateral lower extremities. More in the right thigh he has a lot of irritation at this site unfortunately. In regard to the left lower extremity the wrap was not quite as high it appears and does seem to have caused him some trouble as well. Fortunately there is no evidence of systemic infection though he does have some blue-green drainage which has me concerned for the possibility of Pseudomonas. He tells me he is previously taking Cipro without complications and he really does not care for Levaquin however due to some of the side effects he  has. He is not allergic to any medications specifically antibiotics that were aware of. 10/27/2019 on evaluation today patient actually does appear to be for the most part doing better when compared to last week's evaluation. With that being said he still has multiple open wounds over the bilateral lower extremities. He actually forgot to start taking the Cipro and states that he still has the whole bottle. He does have several new blisters on left lower extremity today I think I would recommend he go ahead and take the Cipro based on what I am seeing at this point. 12/30-Patient comes at 1 week visit, 4 layer compression wraps on the left and Unna boot on the right, primary dressing Xtrasorb and silver alginate. Patient is taking his Cipro and has a few more days left probably 5-6, and the legs are doing better. He states he is using his compressions devices which I believe he has 11/10/2019 on evaluation today patient actually appears to be much better than last time I saw him 2 weeks ago. His wounds are significantly improved and overall I am very pleased in this regard. Fortunately there is no signs of active infection at this time. He is just a couple of days away from completing Cipro. Overall his edema is much better he has been using his lymphedema pumps which I think is also helping at this point. 11/17/2019 on evaluation today patient appears to be doing excellent in regard to his wounds in general. His legs are swollen but not nearly as much as they have been in the past. Fortunately he is tolerating the compression wraps without complication. No fevers, chills, nausea, vomiting, or diarrhea. He does have some erythema however in the distal portion of his right lower extremity specifically around the forefoot and toes there is a little bit of warmth here as well. 11/24/2019 on evaluation today patient appears to be doing well with regard to his right lower extremity I really do not see any open  wounds at this point. His left lower extremity does have several open areas and his right medial thigh also is open. Other than this however overall the patient seems to be making good progress and I am very pleased at this point. 12/01/2019 on evaluation today patient appears to be doing poorly at this point in regard to his left lower extremity has several new blisters despite the fact that we have him in compression wraps. In fact he had a 4-layer compression wrap, his upper thigh wrapped from lymphedema clinic, and a juxta light over top of the 4 layer compression wrap the lymphedema clinic applied and despite all this he still develop blisters underneath. Obviously this does have me concerned about the fact that  unfortunately despite what we are doing to try to get wounds healed he continues to have new areas arise I do not think he is ever good to be at the point where he can realistically just use wraps at home to keep things under control. Typically when we heal him it takes about 1-2 days before he is back in the clinic with severe breakdown and blistering of his lower extremities bilaterally. This is happened numerous times in the past. Unfortunately I think that we may need some help as far as overall fluid overload to kind of limit what we are seeing and get things under better control. 12/08/2019 on evaluation today patient presents for follow-up concerning his ongoing bilateral lower extremity edema. Unfortunately he is still having quite a bit of swelling the compression wraps are controlling this to some degree but he did see Dr. [name] his cardiologist I do have that available for review today as far as the appointment was concerned that was on 12/06/2019. Obviously that she has been 2 days ago. The patient states that he is only been taking the Lasix 80 mg 1 time a day he had told me previously he was taking this twice a day. Nonetheless Dr. [name] recommended this be up to 80 mg 2 times a  day for the patient as he did appear to be fluid overloaded. With that being said the patient states he did this yesterday and he was unable to go anywhere or do anything due to the fact that he was constantly having to urinate. Nonetheless I think that this is still good to be something that is important for him as far as trying to get his edema under control at all things that he is going to be able to just expect his wounds to get under control and things to be better without going through at least a period of time where he is trying to stabilize his fluid management in general and I think increasing the Lasix is likely the first step here. It was also mentioned the possibility that the patient may require metolazone. With that being said he wanted to have the patient take Lasix twice a day first and then reevaluating 2 months to see where things stand. 12/15/2019 upon evaluation today patient appears to be doing regard to his legs although his toes are showing some signs of weeping especially on the left at this point to some degree on the right. There does not appear to be any signs of active infection and overall I do feel like the compression wraps are doing well for him but he has not been able to take the Lasix at home and the increased dose that Dr. [name] recommended. He tells me that just not go to be feasible for him. Nonetheless I think in this case he should probably send a message to Dr. [name] in order to discuss options from the standpoint of possible admission to get the fluid off or otherwise going forward. 12/22/2019 upon evaluation today patient appears to be doing fairly well with regard to his lower extremities at this point. In fact he would be doing excellent if it was not for the fact that his right anterior thigh apparently had an allergic reaction to adhesive tape that he used. The wound itself that we have been monitoring actually appears to be healed. There is a lot of  irritation at this point. 12/29/2019 upon evaluation today patient appears to be doing well in regard to his  lower extremities. His left medial thigh is open and somewhat draining today but this is the only region that is open the right has done much better with the treatment utilizing the steroid cream that I prescribed for him last week. Overall I am pleased in that regard. Fortunately there is no signs of active infection at this time. No fevers, chills, nausea, vomiting, or diarrhea. 01/05/2020 upon evaluation today patient appears to be doing more poorly in regard to his right lower extremity at this point upon evaluation today. Unfortunately he continues to have issues in this regard and I think the biggest issue is controlling his edema. This obviously is not very well controlled at this point is been recommended that he use the Lasix twice a day but he has not been able to do that. Unfortunately I think this is leading to an issue where honestly he is not really able to effectively control his edema and therefore the wounds really are not doing significantly better. I do not think that he is going to be able to keep things under good control unless he is able to control his edema much better. I discussed this again in great detail with him today. 01/12/2020 good news is patient actually appears to be doing quite well today at this point. He does have an appointment with lymphedema clinic tomorrow. His legs appear healed and the toe on the left is almost completely healed. In general I am very pleased with how things stand at this point. 01/19/2020 upon evaluation today patient appears to actually be doing well in regard to his lower extremities there is nothing open at this point. Fortunately he has done extremely well more recently. Has been seeing lymphedema clinic as well. With that being said he has Velcro wraps for his lower legs as well as his upper legs. The only wound really is on his toe  which is the right great toe and this is barely anything even there. With all that being said I think it is good to be appropriate today to go ahead and switch him over to the Velcro compression wraps. 01/26/2020 upon evaluation today patient appears to be doing worse with regard to his lower extremities after last week switch him to Velcro compression wraps. Unfortunately he lasted less than 24 hours he did not have the sock portion of his Velcro wrap on the left leg and subsequently developed a blister underneath the Velcro portion. Obviously this is not good and not what we were looking for at this point. He states the lymphedema clinic did tell him to wear the wrap for 23 hours and take him off for 1 I am okay with that plan but again right now we got a get things back under control again he may have some cellulitis noted as well. 02/02/2020 upon evaluation today patient unfortunately appears to have several areas of blistering on his bilateral lower extremities today mainly on the feet. His legs do seem to be doing somewhat better which is good news. Fortunately there is no evidence of active infection at this time. No fevers, chills, nausea, vomiting, or diarrhea. 02/16/2020 upon evaluation today patient appears to be doing well at this time with regard to his legs. He has a couple weeping areas on his toes but for the most part everything is doing better and does appear to be sealed up on his legs which is excellent news. We can continue with wrapping him at this point as he had every  time we discontinue the wraps he just breaks out with new wounds. There is really no point in is going forward with this at this point. 03/08/2020 upon evaluation today patient actually appears to be doing quite well with regard to his lower extremity ulcers. He has just a very superficial and really almost nonexistent blister on the left lower extremity he has in general done very well with the compression wraps. With  that being said I do not see any signs of infection at this time which is good news. 03/29/2020 upon evaluation today patient appears to be doing well with regard to his wounds currently except for where he had several new areas that opened up due to some of the wrap slipping and causing him trouble. He states he did not realize they had slipped. Nonetheless he has a 1 area on the right and 3 new areas on the left. Fortunately there is no signs of active infection at this time which is great news. 04/05/2020 upon evaluation today patient actually appears to be doing quite well in general in regard to his legs currently. Fortunately there is no signs of active infection at this time. No fevers, chills, nausea, vomiting, or diarrhea. He tells me next week that he will actually be seen in the lymphedema clinic on Thursday at 10 AM I see him on Wednesday next week. 04/12/2020 upon evaluation today patient appears to be doing very well with regard to his lower extremities bilaterally. In fact he does not appear to have any open wounds at this point which is good news. Fortunately there is no signs of active infection at this time. No fevers, chills, nausea, vomiting, or diarrhea. 04/19/2020 upon evaluation today patient appears to be doing well with regard to his wounds currently on the bilateral lower extremities. There does not appear to be any signs of active infection at this time. Fortunately there is no evidence of systemic infection and overall very pleased at this point. Nonetheless after I held him out last week he literally had blisters the next morning already which swelled up with him being right back here in the clinic. Overall I think that he is just not can be able to be discharged with his legs the way they are he is much to volume overloaded as far as fluid is concerned and that was discussed with him today of also discussed this but should try the clinic nurse manager as well as Dr.  Leanord Hawking. 04/26/2020 upon evaluation today patient appears to be doing better with regard to his wounds currently. He is making some progress and overall swelling is under good control with the compression wraps. Fortunately there is no evidence of active infection at this time. 05/10/2020 on evaluation today patient appears to be doing overall well in regard to his lower extremities bilaterally. He is Tolerating the compression wraps without complication and with what we are seeing currently I feel like that he is making excellent progress. There is no signs of active infection at this time. 05/24/2020 upon evaluation today patient appears to be doing well in regard to his legs. The swelling is actually quite a bit down compared to where it has been in the past. Fortunately there is no sign of active infection at this time which is also good news. With that being said he does have several wounds on his toes that have opened up at this point. 05/31/2020 upon evaluation today patient appears to be doing well with regard to his  legs bilaterally where he really has no significant fluid buildup at this point overall he seems to be doing quite well. Very pleased in this regard. With regard to his toes these also seem to be drying up which is excellent. We have continue to wrap him as every time we tried as a transition to the juxta light wraps things just do not seem to get any better. 06/07/2020 upon evaluation today patient appears to be doing well with regard to his right leg at this point. Unfortunately left leg has a lot of blistering he tells me the wrap started to slide down on him when he tried to put his other Velcro wrap over top of it to help keep things in order but nonetheless still had some issues. 06/14/2020 on evaluation today patient appears to be doing well with regard to his lower extremity ulcers and foot ulcers at this point. I feel like everything is actually showing signs of improvement which  is great news overall there is no signs of active infection at this time. No fevers, chills, nausea, vomiting, or diarrhea. 06/21/2020 on evaluation today patient actually appears to be doing okay in regard to his wounds in general. With that being said the biggest issue I see is on his right foot in particular the first and second toe seem to be doing a little worse due to the fact this is staying very wet. I think he is probably getting need to change out his dressings a couple times in between each week when we see him in regard to his toes in order to keep this drier based on the location and how this is proceeding. 06/28/2020 on evaluation today patient appears to be doing a little bit more poorly overall in regard to the appearance of the skin I am actually somewhat concerned about the possibility of him having a little bit of an infection here. We discussed the course of potentially giving him a doxycycline prescription which he is taken previously with good result. With that being said I do believe that this is potentially mild and at this point easily fixed. I just do not want anything to get any worse. 07/12/2020 upon evaluation today patient actually appears to be making some progress with regard to his legs which is great news there does not appear to be any evidence of active infection. Overall very pleased with where things stand. 07/26/2020 upon evaluation today patient appears to be doing well with regard to his leg ulcers and toe ulcers at this point. He has been tolerating the compression wraps without complication overall very pleased in this regard. 08/09/2020 upon evaluation today patient appears to be doing well with regard to his lower extremities bilaterally. Fortunately there is no signs of active infection overall I am pleased with where things stand. 08/23/2020 on evaluation today patient appears to be doing well with regard to his wound. He has been tolerating the dressing  changes without complication. Fortunately there is no signs of active infection at this time. Overall his legs seem to be doing quite well which is great news and I am very pleased in that regard. No fevers, chills, nausea, vomiting, or diarrhea. 09/13/2020 upon evaluation today patient appears to be doing okay in regard to his lower extremities. He does have a fairly large blister on the right leg which I did remove the blister tissue from today so we can get this to dry out other than that however he seems to be doing  quite well. There is no signs of active infection at this time. 09/27/2020 upon evaluation today patient appears to actually be doing some better in regard to his right leg. Fortunately signs of active infection at this time which is great news. No fevers, chills, nausea, vomiting, or diarrhea. 10/04/2020 upon evaluation today patient actually appears to be showing signs of improvement which is great news with regard to his leg ulcers. Fortunately there is no signs of active infection which is great news he is still taking the antibiotics currently. No fevers, chills, nausea, vomiting, or diarrhea. 10/18/2020 on evaluation today patient appears to be doing well with regard to his legs currently. He has been tolerating the dressing changes including the wraps without complication. Fortunately there is no signs of active infection at this time. No fevers, chills, nausea, vomiting, or diarrhea. 10/25/2020 upon evaluation today patient appears to be doing decently well in regard to his wounds currently. He has been tolerating the dressing changes without complication. Overall I feel like he is making good progress albeit slow. Again this is something we can have to continue to wrap for some time to come most likely. 11/08/2020 upon evaluation today patient appears to be doing well with regard to his wounds currently. He has been tolerating the dressing changes without complication is not  currently on any antibiotics and he does not appear to show any signs of infection. He does continue to have a lot of drainage on the right leg not too severe but nonetheless this is very scattered. On the left leg this is looking to be much improved overall. 11/15/2020 upon evaluation today patient appears to be doing better with regard to his legs bilaterally. Especially the right leg which was much more significant last week. There does not appear to be any signs of active infection which is great news. No fevers, chills, nausea, vomiting, or diarrhea. 11/23/2019 upon evaluation today patient appears to be doing poorly still in regard to his lower extremities bilaterally. Unfortunately his right leg in particular appears to be doing much more poorly there is no signs really of infection this is not warm to touch but he does have a lot of drainage and weeping unfortunately. With that reason I do believe that we may need to initiate some treatment here to try to help calm down some of the swelling of the right leg. I think switching to a 4-layer compression wrap would be beneficial here. The patient is in agreement with giving this a try. 11/29/2020 upon evaluation today patient appears to be doing well currently in regard to his leg ulcers. I feel like the right leg is doing better he still has a lot of drainage but we do see some improvement here. The 4-layer compression wrap I think was helpful. 12/06/2020 upon evaluation today patient appears to be doing well with regard to his legs. In fact they seem to be doing about the best I have seen up to this point. Fortunately there is no signs of active infection at this time. No fevers, chills, nausea, vomiting, or diarrhea. 12/20/2020 upon evaluation today patient appears to be doing well at this time in regard to his legs. He is not having any significant draining which is great news. Fortunately there is no signs of active infection at this time. No fevers,  chills, nausea, vomiting, or diarrhea. 01/17/2021 upon evaluation today Jareb actually appears to be doing excellent in regard to his legs. He has a few areas again that  come and go as far as his toes are concerned but overall this is doing quite well. 01/31/2021 upon evaluation today patient appears to be doing well with regard to his legs. Fortunately there does not appear to be any signs of active infection which is great news. Overall he is still having significant edema despite the compression wraps basically the 4-layer compression wrap to just keep things under control there is really not much room for play. 4/13: Mr. Gasparyan is a longstanding patient in our clinic and benefits greatly from weekly compression wraps. Today he has no complaints. He has been tolerating the wraps well. He states he is using the lymphedema pumps at home. 5/4; patient presents for follow-up of his chronic lymphedema/venous insufficiency ulcers. He comes weekly for compression wraps. He has no complaints today. He was unable to tolerate the Coflex 2 layer Last week so we will do the four press 4-layer compression. He has been using his lymphedema pumps daily. 5/18; patient presents for 2-week follow-up. He has no complaints or issues today. He has developed a new wound to the right foot on his fourth toe. He overall feels well and denies signs of infection. Electronic Signature(s) Signed: 03/21/2021 12:06:49 PM By: Kevin Corwin DO Entered By: [name] on 03/21/2021 12:03:18 -------------------------------------------------------------------------------- Physical Exam Details Patient Name: Date of Service: [name], [name] 03/21/2021 10:30 A M Medical Record Number: [id number] Patient Account Number: [id number] Date of Birth/Sex: Treating RN: 1951-08-01 (70 y.o. [name] Primary Care Provider: Nicoletta [name] Other Clinician: Referring Provider: Treating Provider/Extender: [name] in Treatment: 269 Constitutional respirations regular, non-labored and within target range for patient.Marland Kitchen Psychiatric pleasant and cooperative. Notes Left lower extremity: Anterior aspect has a small wound limited to skin breakdown. The second left toe also has a wound limited to skin breakdown. No signs of infection. Right foot: Fourth toe has open wound limited to skin breakdown with no signs of infection. Excellent granulation tissue in the wound bed to all wound sites. Electronic Signature(s) Signed: 03/21/2021 12:06:49 PM By: Kevin Corwin DO Entered By: [name] on 03/21/2021 12:04:34 -------------------------------------------------------------------------------- Physician Orders Details Patient Name: Date of Service: [name], [name] 03/21/2021 10:30 A M Medical Record Number: [id number] Patient Account Number: [id number] Date of Birth/Sex: Treating RN: October 29, 1951 (70 y.o. [name] Primary Care Provider: Nicoletta [name] Other Clinician: Referring Provider: Treating Provider/Extender: [name] in Treatment: [phone] Verbal / Phone Orders: No Diagnosis Coding ICD-10 Coding Code Description L97.521 Non-pressure chronic ulcer of other 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 of skin L97.829 Non-pressure chronic ulcer of other part of left lower leg with unspecified severity L97.519 Non-pressure chronic ulcer of other part of right foot with unspecified severity I87.333 Chronic venous hypertension (idiopathic) with ulcer and inflammation of bilateral lower extremity I89.0 Lymphedema, not elsewhere classified E11.622 Type 2 diabetes mellitus with other skin ulcer 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, unspecified Follow-up Appointments ppointment in 2 weeks. - with Dr. Mikey Bussing Return A Nurse Visit: - 1 week- Wed Bathing/ Shower/ Hygiene May shower with protection but do not get  wound dressing(s) wet. Edema Control - Lymphedema / SCD / Other Bilateral Lower Extremities Lymphedema Pumps. Use Lymphedema pumps on leg(s) 2-3 times a day for 45-60 minutes. If wearing any wraps or hose, do not remove them. Continue exercising as instructed. Elevate legs to the level of the heart or above for 30 minutes daily and/or when sitting, a frequency of: -  throughout the day Avoid standing for long periods of time. Exercise regularly Non Wound Condition Bilateral Lower Extremities Other Non Wound Condition Orders/Instructions: - lotion to both legs, 4 layer compression wraps right legs Wound Treatment Wound #193 - T Second oe Wound Laterality: Left Prim Dressing: PolyMem Silver Non-Adhesive Dressing, 4.25x4.25 in Every Other Day/15 Days ary Discharge Instructions: Apply to wound bed as instructed Secondary Dressing: Woven Gauze Sponges 2x2 in Every Other Day/15 Days Discharge Instructions: Apply over primary dressing as directed. Secured With: Insurance underwriter, Sterile 2x75 (in/in) Every Other Day/15 Days Discharge Instructions: Secure with stretch gauze as directed. Wound #195 - Lower Leg Wound Laterality: Left, Anterior Peri-Wound Care: Sween Lotion (Moisturizing lotion) 1 x Per Week/30 Days Discharge Instructions: Apply moisturizing lotion as directed Prim Dressing: PolyMem Silver Non-Adhesive Dressing, 4.25x4.25 in 1 x Per Week/30 Days ary Discharge Instructions: Apply to wound bed as instructed Secondary Dressing: ABD Pad, 5x9 1 x Per Week/30 Days Discharge Instructions: Apply over primary dressing as directed. Compression Wrap: FourPress (4 layer compression wrap) 1 x Per Week/30 Days Discharge Instructions: Apply four layer compression as directed. May also use [place]iken CoFlex 2 layer compression system as alternative. Wound #196 - T Fourth oe Wound Laterality: Right Prim Dressing: PolyMem Silver Non-Adhesive Dressing, 4.25x4.25 in Every Other Day/15  Days ary Discharge Instructions: Apply to wound bed as instructed Secondary Dressing: Woven Gauze Sponges 2x2 in Every Other Day/15 Days Discharge Instructions: Apply over primary dressing as directed. Secured With: Insurance underwriter, Sterile 2x75 (in/in) Every Other Day/15 Days Discharge Instructions: Secure with stretch gauze as directed. Electronic Signature(s) Signed: 03/21/2021 12:06:49 PM By: Kevin Corwin DO Entered By: [name] on 03/21/2021 12:05:00 -------------------------------------------------------------------------------- Problem List Details Patient Name: Date of Service: [name], [name] 03/21/2021 10:30 A M Medical Record Number: [id number] Patient Account Number: [id number] Date of Birth/Sex: Treating RN: 1951-02-13 (70 y.o. [name] Primary Care Provider: Nicoletta [name] Other Clinician: Referring Provider: Treating Provider/Extender: [name] in Treatment: 269 Active Problems ICD-10 Encounter Code Description Active Date MDM Diagnosis L97.521 Non-pressure chronic ulcer of other 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 of 04/27/2018 No Yes skin L97.829 Non-pressure chronic ulcer of other part of left lower leg with unspecified 03/21/2021 No Yes severity L97.519 Non-pressure chronic ulcer of other part of right foot with unspecified severity 03/21/2021 No Yes I87.333 Chronic venous hypertension (idiopathic) with ulcer and inflammation of 01/22/2016 No Yes bilateral lower extremity I89.0 Lymphedema, not elsewhere classified 01/22/2016 No Yes E11.622 Type 2 diabetes mellitus with other skin ulcer 01/22/2016 No Yes 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, unspecified 01/22/2016 No Yes Inactive Problems ICD-10 Code Description Active Date Inactive Date L03.115 Cellulitis of right lower limb 12/22/2017 12/22/2017 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  06/30/2018 06/30/2018 L97.511 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 of skin 06/30/2018 06/30/2018 Resolved Problems ICD-10 Code Description Active Date Resolved Date L97.211 Non-pressure chronic ulcer of right calf limited to breakdown of skin 06/30/2018 06/30/2018 L97.221 Non-pressure chronic ulcer of left calf limited to breakdown of skin 09/30/2016 09/30/2016 L03.116 Cellulitis of left lower limb 04/01/2017 04/01/2017 L97.211 Non-pressure chronic ulcer of right calf limited to breakdown of skin 06/30/2017 06/30/2017 Electronic Signature(s) Signed: 03/21/2021 12:06:49 PM By: Kevin Corwin DO Entered By: [name] on 03/21/2021 12:02:04 -------------------------------------------------------------------------------- Progress Note/History and Physical Details Patient Name: Date of Service: [name], [name] 03/21/2021 10:30 A M Medical  Record Number: [id number] Patient Account Number: [id number] Date of Birth/Sex: Treating RN: May 10, 1951 (70 y.o. [name] Primary Care Provider: Nicoletta [name] Other Clinician: Referring Provider: Treating Provider/Extender: [name] in Treatment: 269 Subjective Chief Complaint Information obtained from Patient patient is here for evaluation of venous/lymphedema ulcers History of Present Illness (HPI) Referred by PCP for consultation. Patient has long standing history of BLE venous stasis, no prior ulcerations. At beginning of month, developed cellulitis and weeping. Received IM Rocephin followed by Keflex and resolved. Wears compression stocking, appr 6 months old. Not sure strength. No present drainage. 01/22/16 this is a patient who is a type II diabetic on insulin. 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 He tells me he religiously wears pressure stockings of uncertain strength. He was here with weeping edema about 8 months ago but did not  have an open wound. Roughly a month ago he had a reopening on his bilateral legs. He is been using bandages and Neosporin. He does not complain of pain. 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 Last BUN/creatinine I have is from 11/20/15 at 13 and 1.0 respectively 01/29/16; patient arrives today having tolerated the Profore wrap. He brought in his stockings and these are 18 mmHg stockings he bought from [employer]. The compression here is likely inadequate. He does not complain of pain or excessive drainage she has no systemic symptoms. The wound on the right looks improved as does the one on the left although one on the left is more substantial with still tissue at risk below the actual wound area on the bilateral posterior calf 02/05/16; patient arrives with poor edema control. He states that we did put a 4 layer compression on it last week. No weight appear 5 this. 02/12/16; the area on the posterior right Has healed. The left Has a substantial wound that has necrotic surface eschar that requires a debridement with a curette. 02/16/16;the patient called or a Nurse visit secondary to increased swelling. He had been in earlier in the week with his right leg healed. He was transitioned to is on pressure stocking on the right leg with the only open wound on the left, a substantial area on the left posterior calf. Note he has a history of severe lower extremity edema, he has a history of chronic atrial fibrillation but not heart failure per my notes but I'll need to research this. He is not complaining of chest pain shortness of breath or orthopnea. The intake nurse noted blisters on the previously closed right leg 02/19/16; this is the patient's regular visit day. I see him on Friday with escalating edema new wounds on the right leg and clear signs of at least right ventricular heart failure. I  increased his Lasix to 40 twice a day. He is returning currently in follow-up. States he is noticed a decrease in that the edema 02/26/16 patient's legs have much less edema. There is nothing really open on the right leg. The left leg has improved condition of the large superficial wound on the posterior left leg 03/04/16; edema control is very much better. The patient's right leg wounds have healed. On the left leg he continues to have severe venous inflammation on the posterior aspect of the left leg. There is no tenderness and I don't think any of this is cellulitis. 03/11/16; patient's right leg is  married healed and he is in his own stocking. The patient's left leg has deteriorated somewhat. There is a lot of erythema around the wound on the posterior left leg. There is also a significant rim of erythema posteriorly just above where the wrap would've ended there is a new wound in this location and a lot of tenderness. Can't rule out cellulitis in this area. 03/15/16; patient's right leg remains healed and he is in his own stocking. The patient's left leg is much better than last review. His major wound on the posterior aspect of his left Is almost fully epithelialized. He has 3 small injuries from the wraps. Really. Erythema seems a lot better on antibiotics 03/18/16; right leg remains healed and he is in his own stocking. The patient's left leg is much better. The area on the posterior aspect of the left calf is fully epithelialized. His 3 small injuries which were wrap injuries on the left are improved only one seems still open his erythema has resolved 03/25/16; patient's right leg remains healed and he is in his own stocking. There is no open area today on the left leg posterior leg is completely closed up. His wrap injuries at the superior aspect of his leg are also resolved. He looks as though he has some irritation on the dorsal ankle but this is fully epithelialized without evidence of  infection. 03/28/16; we discharged this patient on Monday. Transitioned him into his own stocking. There were problems almost immediately with uncontrolled swelling weeping edema multiple some of which have opened. He does not feel systemically unwell in particular no chest pain no shortness of breath and he does not feel 04/08/16; the edema is under better control with the Profore light wrap but he still has pitting edema. There is one large wound anteriorly 2 on the medial aspect of his left leg and 3 small areas on the superior posterior calf. Drainage is not excessive he is tolerating a Profore light well 04/15/16; put a Profore wrap on him last week. This is controlled is edema however he had a lot of pain on his left anterior foot most of his wounds are healed 04/22/16 once again the patient has denuded areas on the left anterior foot which he states are because his wrap slips up word. He saw his primary physician today is on Lasix 40 twice a day and states that he his weight is down 20 pounds over the last 3 months. 04/29/16: Much improved. left anterior foot much improved. He is now on Lasix 80 mg per day. Much improved edema control 05/06/16; I was hoping to be able to discharge him today however once again he has blisters at a low level of where the compression was placed last week mostly on his left lateral but also his left medial leg and a small area on the anterior part of the left foot. 05/09/16; apparently the patient went home after his appointment on 7/4 later in the evening developing pain in his upper medial thigh together with subjective fever and chills although his temperature was not taken. The pain was so intense he felt he would probably have to call 911. However he then remembered that he had leftover doxycycline from a previous round of antibiotics and took these. By the next morning he felt a lot better. He called and spoke to one of our nurses and I approved doxycycline over the  phone thinking that this was in relation to the wounds we had previously seen  although they were definitely were not. The patient feels a lot better old fever no chills he is still working. Blood sugars are reasonably controlled 05/13/16; patient is back in for review of his cellulitis on his anterior medial upper thigh. He is taking doxycycline this is a lot better. Culture I did of the nodular area on the dorsal aspect of his foot grew MRSA this also looks a lot better. 05/20/16; the patient is cellulitis on the medial upper thigh has resolved. All of his wound areas including the left anterior foot, areas on the medial aspect of the left calf and the lateral aspect of the calf at all resolved. He has a new blister on the left dorsal foot at the level of the fourth toe this was excised. No evidence of infection 05/27/16; patient continues to complain weeping edema. He has new blisterlike wounds on the left anterior lateral and posterior lateral calf at the top of his wrap levels. The area on his left anterior foot appears better. He is not complaining of fever, pain or pruritus in his feet. 05/30/16; the patient's blisters on his left anterior leg posterior calf all look improved. He did not increase the Lasix 100 mg as I suggested because he was going to run out of his 40 mg tablets. He is still having weeping edema of his toes 06/03/16; I renewed his Lasix at 80 mg once a day as he was about to run out when I last saw him. He is on 80 mg of Lasix now. I have asked him to cut down on the excessive amount of water he was drinking and asked him to drink according to his thirst mechanisms 06/12/2016 -- was seen 2 days ago and was supposed to wear his compression stockings at home but he is developed lymphedema and superficial blisters on the left lower extremity and hence came in for a review 06/24/16; the remaining wound is on his left anterior leg. He still has edema coming from between his toes. There is  lymphedema here however his edema is generally better than when I last saw this. He has a history of atrial fibrillation but does not have a known history of congestive heart failure nevertheless I think he probably has this at least on a diastolic basis. 07/01/16 I reviewed his echocardiogram from January 2017. This was essentially normal. He did not have LVH, EF of 55-60%. His right ventricular function was normal although he did have trivial tricuspid and pulmonic regurgitation. This is not audible on exam however. I increased his Lasix to do massive edema in his legs well above his knees I think in early July. He was also drinking an excessive amount of water at the time. 07/15/16; missed his appointment last week because of the Labor Day holiday on Monday. He could not get another appointment later in the week. Started to feel the wrap digging in superiorly so we remove the top half and the bottom half of his wrap. He has extensive erythema and blistering superiorly in the left leg. Very tender. Very swollen. Edema in his foot with leaking edema fluid. He has not been systemically unwell 07/22/16; the area on the left leg laterally required some debridement. The medial wounds look more stable. His wrap injury wounds appear to have healed. Edema and his foot is better, weeping edema is also better. He tells me he is meeting with the supplier of the external compression pumps at work 08/05/16; the patient was on vacation last  week in [place]. His wrap is been on for an extended period of time. Also over the weekend he developed an extensive area of tender erythema across his anterior medial thigh. He took to doxycycline yesterday that he had leftover from a previous prescription. The patient complains of weeping edema coming out of his toes 08/08/16; I saw this patient on 10/2. He was tender across his anterior thigh. I put him on doxycycline. He returns today in follow-up. He does not have  any open wounds on his lower leg, he still has edema weeping into his toes. 08/12/16; patient was seen back urgently today to follow-up for his extensive left thigh cellulitis/erysipelas. He comes back with a lot less swelling and erythema pain is much better. I believe I gave him Augmentin and Cipro. His wrap was cut down as he stated a roll down his legs. He developed blistering above the level of the wrap that remained. He has 2 open blisters and 1 intact. 08/19/16; patient is been doing his primary doctor who is increased his Lasix from 40-80 once a day or 80 already has less edema. Cellulitis has remained improved in the left thigh. 2 open areas on the posterior left calf 08/26/16; he returns today having new open blisters on the anterior part of his left leg. He has his compression pumps but is not yet been shown how to use some vital representative from the supplier. 09/02/16 patient returns today with no open wounds on the left leg. Some maceration in his plantar toes 09/10/2016 -- Dr. Leanord Hawking had recently discharged him on 09/02/2016 and he has come right back with redness swelling and some open ulcers on his left lower extremity. He says this was caused by trying to apply his compression stockings and he's been unable to use this and has not been able to use his lymphedema pumps. He had some doxycycline leftover and he has started on this a few days ago. 09/16/16; there are no open wounds on his leg on the left and no evidence of cellulitis. 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 He does not complain of symptoms here. I am not clear use using his external compression pumps. 09/23/16; I have not seen this patient in 2 weeks. He canceled his appointment 10 days ago as he was going on vacation. He tells me that on Monday he noticed a large area on his posterior left leg which is been draining copiously and is reopened into a large  wound. He is been using ABDs and the external part of his juxtalite, according to our nurse this was not on properly. 10/07/16; Still a substantial area on the posterior left leg. Using silver alginate 10/14/16; in general better although there is still open area which looks healthy. Still using silver alginate. He reminds me that this happen before he left for [facility]. T oday while he was showering in the morning. He had been using his juxtalite's 10/21/16; the area on his posterior left leg is fully epithelialized. However he arrives today with a large area of tender erythema in his medial and posterior left thigh just above the knee. I have marked the area. Once again he is reluctant to consider hospitalization. I treated him with oral antibiotics in the past for a similar situation with resolution I think with doxycycline however this area it seems more extensive to me. He is not complaining of fever but does have chills and  says states he is thirsty. His blood sugar today was in the 140s at home 10/25/16 the area on his posterior left leg is fully epithelialized although there is still some weeping edema. The large area of tenderness and erythema in his medial and posterior left thigh is a lot less tender although there is still a lot of swelling in this thigh. He states he feels a lot better. He is on doxycycline and Augmentin that I started last week. This will continued until Tuesday, December 26. I have ordered a duplex ultrasound of the left thigh rule out DVT whether there is an abscess something that would need to be drained I would also like to know. 11/01/16; he still has weeping edema from a not fully epithelialized area on his left posterior calf. Most of the rest of this looks a lot better. He has completed his antibiotics. His thigh is a lot better. Duplex ultrasound did not show a DVT in the thigh 11/08/16; he comes in today with more Denuded surface epithelium from the posterior  aspect of his calf. There is no real evidence of cellulitis. The superior aspect of his wrap appears to have put quite an indentation in his leg just below the knee and this may have contributed. He does not complain of pain or fever. We have been using silver alginate as the primary dressing. The area of cellulitis in the right thigh has totally resolved. He has been using his compression stockings once a week 11/15/16; the patient arrives today with more loss of epithelium from the posterior aspect of his left calf. He now has a fairly substantial wound in this area. The reason behind this deterioration isn't exactly clear although his edema is not well controlled. He states he feels he is generally more swollen systemically. He is not complaining of chest pain shortness of breath fever. T me he has an appointment with his primary physician in early February. He is on 80 mg of oral ells Lasix a day. He claims compliance with the external compression pumps. He is not having any pain in his legs similar to what he has with his recurrent cellulitis 11/22/16; the patient arrives a follow-up of his large area on his left lateral calf. This looks somewhat better today. He came in earlier in the week for a dressing change since I saw him a week ago. He is not complaining of any pain no shortness of breath no chest pain 11/28/16; the patient arrives for follow-up of his large area on the left lateral calf this does not look better. In fact it is larger weeping edema. The surface of the wound does not look too bad. We have been using silver alginate although I'm not certain that this is a dressing issue. 12/05/16; again the patient follows up for a large wound on the left lateral and left posterior calf this does not look better. There continues to be weeping edema necrotic surface tissue. More worrisome than this once again there is erythema below the wound involving the distal Achilles and heel suggestive of  cellulitis. He is on his feet working most of the day of this is not going well. We are changing his dressing twice a week to facilitate the drainage. 12/12/16; not much change in the overall dimensions of the large area on the left posterior calf. This is very inflamed looking. I gave him an. Doxycycline last week does not really seem to have helped. He found the wrap very painful indeed  it seems to of dog into his legs superiorly and perhaps around the heel. He came in early today because the drainage had soaked through his dressings. 12/19/16- patient arrives for follow-up evaluation of his left lower extremity ulcers. He states that he is using his lymphedema pumps once daily when there is "no drainage". He admits to not using his lipedema pumps while under current treatment. His blood sugars have been consistently between 150-200. 12/26/16; the patient is not using his compression pumps at home because of the wetness on his feet. I've advised him that I think it's important for him to use this daily. He finds his feet too wet, he can put a plastic bag over his legs while he is in the pumps. Otherwise I think will be in a vicious circle. We are using silver alginate to the major area on his left posterior calf 01/02/17; the patient's posterior left leg has further of all into 3 open wounds. All of them covered with a necrotic surface. He claims to be using his compression pumps once a day. His edema control is marginal. Continue with silver alginate 01/10/17; the patient's left posterior leg actually looks somewhat better. There is less edema, less erythema. Still has 3 open areas covered with a necrotic surface requiring debridement. He claims to be using his compression pumps once a day his edema control is better 01/17/17; the patient's left posterior calf look better last week when I saw him and his wrap was changed 2 days ago. He has noted increasing pain in the left heel and arrives today with much  larger wounds extensive erythema extending down into the entire heel area especially tender medially. He is not systemically unwell CBGs have been controlled no fever. Our intake nurse showed me limegreen drainage on his AVD pads. 01/24/17; his usual this patient responds nicely to antibiotics last week giving him Levaquin for presumed Pseudomonas. The whole entire posterior part of his leg is much better much less inflamed and in the case of his Achilles heel area much less tender. He has also had some epithelialization posteriorly there are still open areas here and still draining but overall considerably better 01/31/17- He has continue to tolerate the compression wraps. he states that he continues to use the lymphedema pumps daily, and can increase to twice daily on the weekends. He is voicing no complaints or concerns regarding his LLE ulcers 02/07/17-he is here for follow-up evaluation. He states that he noted some erythema to the left medial and anterior thigh, which he states is new as of yesterday. He is concerned about recurrent cellulitis. He states his blood sugars have been slightly elevated, this morning in the 180s 02/14/17; he is here for follow-up evaluation. When he was last here there was erythema superiorly from his posterior wound in his anterior thigh. He was prescribed Levaquin however a culture of the wound surface grew MRSA over the phone I changed him to doxycycline on Monday and things seem to be a lot better. 02/24/17; patient missed his appointment on Friday therefore we changed his nurse visit into a physician visit today. Still using silver alginate on the large area of the posterior left thigh. He isn't new area on the dorsal left second toe 03/03/17; actually better today although he admits he has not used his external compression pumps in the last 2 days or so because of work responsibilities over the weekend. 03/10/17; continued improvement. External compression pumps once a  day almost all of his  wounds have closed on the posterior left calf. Better edema control 03/17/17; in general improved. He still has 3 small open areas on the lateral aspect of his left leg however most of the area on the posterior part of his leg is epithelialized. He has better edema control. He has an ABD pad under his stocking on the right anterior lower leg although he did not let us look at that today. 03/24/17; patient arrives back in clinic today with no open areas however there are areas on the posterior left calf and anterior left calf that are less than 100% epithelialized. His edema is well controlled in the left lower leg. There is some pitting edema probably lymphedema in the left upper thigh. He uses compression pumps at home once per day. I tried to get him to do this twice a day although he is very reticent. 04/01/2017 -- for the last 2 days he's had significant redness, tenderness and weeping and came in for an urgent visit today. 04/07/17; patient still has 6 more days of doxycycline. He was seen by Dr. Meyer Russel last Wednesday for cellulitis involving the posterior aspect, lateral aspect of his Involving his heel. For the most part he is better there is less erythema and less weeping. He has been on his feet for 12 hours o2 over the weekend. Using his compression pumps once a day 04/14/17 arrives today with continued improvement. Only one area on the posterior left calf that is not fully epithelialized. He has intense bilateral venous inflammation associated with his chronic venous insufficiency disease and secondary lymphedema. We have been using silver alginate to the left posterior calf wound In passing he tells [place] today that the right leg but we have not seen in quite some time has an open area on it but he doesn't want [place] to look at this today states he will show this to [place] next week. 04/21/17; there is no open area on his left leg although he still reports some weeping edema. He  showed [place] his right leg today which is the first time we've seen this leg in a long time. He has a large area of open wound on the right leg anteriorly healthy granulation. Quite a bit of swelling in the right leg and some degree of venous inflammation. He told us about the right leg in passing last week but states that deterioration in the right leg really only happened over the weekend 04/28/17; there is no open area on the left leg although there is an irritated part on the posterior which is like a wrap injury. The wound on the right leg which was new from last week at least to [place] is a lot better. 05/05/17; still no open area on the left leg. Patient is using his new compression stocking which seems to be doing a good job of controlling the edema. He states he is using his compression pumps once per day. The right leg still has an open wound although it is better in terms of surface area. Required debridement. A lot of pain in the posterior right Achilles marked tenderness. Usually this type of presentation this patient gives concern for an active cellulitis 05/12/17; patient arrives today with his major wound from last week on the right lateral leg somewhat better. Still requiring debridement. He was using his compression stocking on the left leg however that is reopened with superficial wounds anteriorly he did not have an open wound on this leg previously. He is still  using his juxta light's once daily at night. He cannot find the time to do this in the morning as he has to be at work by 7 AM 05/19/17; right lateral leg wound looks improved. No debridement required. The concerning area is on the left posterior leg which appears to almost have a subcutaneous hemorrhagic component to it. We've been using silver alginate to all the wounds 05/26/17; the right lateral leg wound continues to look improved. However the area on the left posterior calf is a tightly adherent surface. [place] using  silver alginate. Because of the weeping edema in his legs there is very little good alternatives. 06/02/17; the patient left here last week looking quite good. Major wound on the left posterior calf and a small one on the right lateral calf. Both of these look satisfactory. He tells me that by Wednesday he had noted increased pain in the left leg and drainage. He called on Thursday and Friday to get an appointment here but we were blocked. He did not go to urgent care or his primary physician. He thinks he had a fever on Thursday but did not actually take his temperature. He has not been using his compression pumps on the left leg because of pain. I advised him to go to the emergency room today for IV antibiotics for stents of left leg cellulitis but he has refused I have asked him to take 2 days off work to keep his leg elevated and he has refused this as well. In view of this I'm going to call him and Augmentin and doxycycline. 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 However after a detailed discussion regarding the risk benefits and alternatives he refuses to get admitted to the hospital. With no other recourse I will continue him on oral antibiotics as before and hopefully he'll have his infectious disease consultation this week. 06/16/2017 -- the patient was seen today by the nurse practitioner at infectious disease Ms. Dixon. 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 He has also been advise Lotrimin cream locally. she also had wise IV antibiotics for his condition if it  flares up 06/23/17; patient arrives today with drainage bilaterally although the remaining wound on the left posterior calf after cleaning up today "highlighter yellow drainage" did not look too bad. Unfortunately he has had breakdown on the right anterior leg [previously this leg had not been open and he is using a black stocking] he went to see infectious disease and is been put on clindamycin 150 mg daily, I did not verify the dose although I'm not familiar with using clindamycin in this dosing range, perhaps for prophylaxisoo 06/27/17; I brought this patient back today to follow-up on the wound deterioration on the right lower leg together with surrounding cellulitis. I started him on doxycycline 4 days ago. This area looks better however he comes in today with intense cellulitis on the medial part of his left thigh. This is not have a wound in this area. Extremely tender. We've been using silver alginate to the wounds on the  right lower leg left lower leg with bilateral 4 layer compression he is using his external compression pumps once a day 07/04/17; patient's left medial thigh cellulitis looks better. He has not been using his compression pumps as his insert said it was contraindicated with cellulitis. His right leg continues to make improvements all the wounds are still open. We only have one remaining wound on the left posterior calf. Using silver alginate to all open areas. He is on doxycycline which I started a week ago and should be finishing I gave him Augmentin after Thursday's visit for the severe cellulitis on the left medial thigh which fortunately looks better 07/14/17; the patient's left medial thigh cellulitis has resolved. The cellulitis in his right lower calf on the right also looks better. All of his wounds are stable to improved we've been using silver alginate he has completed the antibiotics I have given him. He has clindamycin 150 mg once a day prescribed by infectious disease  for prophylaxis, I've advised him to start this now. We have been using bilateral Unna boots over silver alginate to the wound areas 07/21/17; the patient is been to see infectious disease who noted his recurrent problems with cellulitis. He was not able to tolerate prophylactic clindamycin therefore he is on amoxicillin 500 twice a day. He also had a second daily dose of Lasix added By Dr. Oneta Rack but he is not taking this. Nor is he being completely compliant with his compression pumps a especially not this week. He has 2 remaining wounds one on the right posterior lateral lower leg and one on the left posterior medial lower leg. 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 The patient has Unna boots bilaterally. 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 He has bilateral Unna boots for compression and silver alginate to his wounds. Arrives today with his legs looking as good as I have seen him in quite some time. Not surprisingly his wounds look better as well with improvement on the right lateral leg venous insufficiency wound and also the left medial leg. He is still using the compression pumps once a day 08/11/17; both legs appear to be doing better wounds on the right lateral and left medial legs look better. Skin on the right leg quite good. He is been using silver alginate as the primary dressing. I'm going to use Anasept gel calcium alginate and maintain all the secondary dressings 08/18/17; the patient continues to actually do quite well. The area on his right lateral leg is just about closed the left medial also looks better although it is still moist in this area. His edema is well controlled we have been using Anasept gel with calcium alginate and the usual secondary  dressings, 4 layer compression and once daily use of his compression pumps "always been able to manage 09/01/17; the patient continues to do reasonably well in spite of his trip to [place]. The area on the right lateral leg is epithelialized. Left is much better but still open. He has more edema and more chronic erythema on the left leg [venous inflammation] 09/08/17; he arrives today with no open wound on the right lateral leg and decently controlled edema. Unfortunately his left leg is not nearly as in his good situation as last week.he apparently  had increasing edema starting on Saturday. He edema soaked through into his foot so used a plastic bag to walk around his home. The area on the medial right leg which was his open area is about the same however he has lost surface epithelium on the left lateral which is new and he has significant pain in the Achilles area of the left foot. He is already on amoxicillin chronically for prophylaxis of cellulitis in the left leg 09/15/17; he is completed a week of doxycycline and the cellulitis in the left posterior leg and Achilles area is as usual improved. He still has a lot of edema and fluid soaking through his dressings. There is no open wound on the right leg. He saw infectious disease NP today 09/22/17;As usual 1 we transition him from our compression wraps to his stockings things did not go well. He has several small open areas on the right leg. He states this was caused by the compression wrap on his skin although he did not wear this with the stockings over them. He has several superficial areas on the left leg medially laterally posteriorly. He does not have any evidence of active cellulitis especially involving the left Achilles The patient is traveling from [facility] Saturday going to [facility]. He states he isn't attempting to get an appointment with a heel objects wound center there to change his dressings. I am not completely certain  whether this will work 10/06/17; the patient came in on Friday for a nurse visit and the nurse reported that his legs actually look quite good. He arrives in clinic today for his regular follow-up visit. He has a new wound on his left third toe over the PIP probably caused by friction with his footwear. He has small areas on the left leg and a very superficial but epithelialized area on the right anterior lateral lower leg. Other than that his legs look as good as I've seen him in quite some time. We have been using silver alginate Review of systems; no chest pain no shortness of breath other than this a 10 point review of systems negative 10/20/17; seen by Dr. Meyer Russel last week. He had taken some antibiotics [doxycycline] that he had left over. Dr. Meyer Russel thought he had candida infection and declined to give him further antibiotics. He has a small wound remaining on the right lateral leg several areas on the left leg including a larger area on the left posterior several left medial and anterior and a small wound on the left lateral. The area on the left dorsal third toe looks a lot better. ROS; Gen.; no fever, respiratory no cough no sputum Cardiac no chest pain other than this 10 point review of system is negative 10/30/17; patient arrives today having fallen in the bathtub 3 days ago. It took him a while to get up. He has pain and maceration in the wounds on his left leg which have deteriorated. He has not been using his pumps he also has some maceration on the right lateral leg. 11/03/17; patient continues to have weeping edema especially in the left leg. This saturates his dressings which were just put on on 12/27. As usual the doxycycline seems to take care of the cellulitis on his lower leg. He is not complaining of fever, chills, or other systemic symptoms. He states his leg feels a lot better on the doxycycline I gave him empirically. He also apparently gets injections at his primary doctor's  officeo Rocephin for cellulitis prophylaxis.  I didn't ask him about his compression pump compliance today I think that's probably marginal. Arrives in the clinic with all of his dressings primary and secondary macerated full of fluid and he has bilateral edema 11/10/17; the patient's right leg looks some better although there is still a cluster of wounds on the right lateral. The left leg is inflamed with almost circumferential skin loss medially to laterally although we are still maintaining anteriorly. He does not have overt cellulitis there is a lot of drainage. He is not using compression pumps. We have been using silver alginate to the wound areas, there are not a lot of options here 11/17/17; the patient's right leg continues to be stable although there is still open wounds, better than last week. The inflammation in the left leg is better. Still loss of surface layer epithelium especially posteriorly. There is no overt cellulitis in the amount of edema and his left leg is really quite good, tells me he is using his compression pumps once a day. 11/24/17; patient's right leg has a small superficial wound laterally this continues to improve. The inflammation in the left leg is still improving however we have continuous surface layer epithelial loss posteriorly. There is no overt cellulitis in the amount of edema in both legs is really quite good. He states he is using his compression pumps on the left leg once a day for 5 out of 7 days 12/01/17; very small superficial areas on the right lateral leg continue to improve. Edema control in both legs is better today. He has continued loss of surface epithelialization and left posterior calf although I think this is better. We have been using silver alginate with large number of absorptive secondary dressings 4 layer on the left Unna boot on the right at his request. He tells me he is using his compression pumps once a day 12/08/17; he has no open area on the  right leg is edema control is good here. ooOn the left leg however he has marked erythema and tenderness breakdown of skin. He has what appears to be a wrap injury just distal to the popliteal fossa. 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 There was swelling erythema and breakdown in the right leg. He has much worse skin breakdown in the left leg as well multiple open areas medially and posteriorly as well as laterally. He tells me he has been using his compression pumps but tells me he feels that the drainage out of his leg is worse when he uses a compression pumps. T be fair to him he is been saying this o for a while however I don't know that I have really been listening to this. I wonder if the compression pumps are working properly 12/22/17;. Once again he arrives with severe erythema, weeping edema from the left greater than right leg. Noncompliance with compression pumps. New this visit he is complaining of pain on the lateral aspect of the right leg and the medial aspect of his right thigh. He apparently saw his cardiologist Dr. [name] who was ordered an echocardiogram area and I think this is a step in the right direction 12/25/17; started his doxycycline Monday night. There is still intense erythema of the  right leg especially in the anterior thigh although there is less tenderness. The erythema around the wound on the right lateral calf also is less tender. He still complaining of pain in the left heel. His wounds are about the same right lateral left medial left lateral. Superficial but certainly not close to closure. He denies being systemically unwell no fever chills no abdominal pain no diarrhea 12/29/17; back  in follow-up of his extensive right calf and right thigh cellulitis. I added amoxicillin to cover possible doxycycline resistant strep. This seems to of done the trick he is in much less pain there is much less erythema and swelling. He has his echocardiogram at 11:00 this morning. X-ray of the left heel was also negative. 01/05/18; the patient arrived with his edema under much better control. Now that he is retired he is able to use his compression pumps daily and sometimes twice a day per the patient. He has a wound on the right leg the lateral wound looks better. Area on the left leg also looks a lot better. He has no evidence of cellulitis in his bilateral thighs I had a quick peak at his echocardiogram. He is in normal ejection fraction and normal left ventricular function. He has moderate pulmonary hypertension moderately reduced right ventricular function. One would have to wonder about chronic sleep apnea although he says he doesn't snore. He'll review the echocardiogram with his cardiologist. 01/12/18; the patient arrives with the edema in both legs under exemplary control. He is using his compression pumps daily and sometimes twice daily. His wound on the right lateral leg is just about closed. He still has some weeping areas on the posterior left calf and lateral left calf although everything is just about closed here as well. I have spoken with Aldean Baker who is the patient's nurse practitioner and infectious disease. She was concerned that the patient had not understood that the parenteral penicillin injections he was receiving for cellulitis prophylaxis was actually benefiting him. I don't think the patient actually saw that I would tend to agree we were certainly dealing with less infections although he had a serious one last month. 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 He is healed. He'll be placed in juxtalite compression wraps and increase  his lymphedema pumps to twice daily. We will follow up again next week to ensure there are no issues with the new regiment. 01/20/18-he is here for evaluation of bilateral lower extremity weeping edema. Yesterday he was placed in compression wrap to the right lower extremity and compression stocking to left lower shrubbery. He states he uses lymphedema pumps last night and again this morning and noted a blister to the left lower extremity. On exam he was noted to have drainage to the right lower extremity. He will be placed in Unna boots bilaterally and follow-up next week 01/26/18; patient was actually discharged a week ago to his own juxta light stockings only to return the next day with bilateral lower extremity weeping edema.he was placed in bilateral Unna boots. He arrives today with pain in the back of his left leg. There is no open area on the right leg however there is a linear/wrap injury on the left leg and weeping edema on the left leg posteriorly. I spoke with infectious disease about 10 days ago. They were disappointed that the patient elected to discontinue prophylactic intramuscular penicillin shots as they felt it was particularly beneficial in reducing the frequency of his cellulitis.  I discussed this with the patient today. He does not share this view. He'll definitely need antibiotics today. Finally he is traveling to [place] and trauma leaving this Saturday and returning a week later and he does not travel with his pumps. He is going by car 01/30/18; patient was seen 4 days ago and brought back in today for review of cellulitis in the left leg posteriorly. I put him on amoxicillin this really hasn't helped as much as I might like. He is also worried because he is traveling to [name] by car. Finally we will be rewrapping him. There is no open area on the right leg over his left leg has multiple weeping areas as usual 02/09/18; The same wrap on for 10 days. He did not pick up  the last doxycycline I prescribed for him. He apparently took 4 days worth he already had. There is nothing open on his right leg and the edema control is really quite good. He's had damage in the left leg medially and laterally especially probably related to the prolonged use of Unna boots 02/12/18; the patient arrived in clinic today for a nurse visit/wrap change. He complained of a lot of pain in the left posterior calf. He is taking doxycycline that I previously prescribed for him. Unfortunately even though he used his stockings and apparently used to compression pumps twice a day he has weeping edema coming out of the lateral part of his right leg. This is coming from the lower anterior lateral skin area. 02/16/18; the patient has finished his doxycycline and will finish the amoxicillin 2 days. The area of cellulitis in the left calf posteriorly has resolved. He is no longer having any pain. He tells me he is using his compression pumps at least once a day sometimes twice. 02/23/18; the patient finished his doxycycline and Amoxil last week. On Friday he noticed a small erythematous circle about the size of a quarter on the left lower leg just above his ankle. This rapidly expanded and he now has erythema on the lateral and posterior part of the thigh. This is bright red. Also has an area on the dorsal foot just above his toes and a tender area just below the left popliteal fossa. He came off his prophylactic penicillin injections at his own insistence one or 2 months ago. This is obviously deteriorated since then 03/02/18; patient is on doxycycline and Amoxil. Culture I did last week of the weeping area on the back of his left calf grew group B strep. I have therefore renewed the amoxicillin 500 3 times a day for a further week. He has not been systemically unwell. Still complaining of an area of discomfort right under his left popliteal fossa. There is no open wound on the right leg. He tells me that  he is using his pumps twice a day on most days 03/09/18; patient arrives in clinic today completing his amoxicillin today. The cellulitis on his left leg is better. Furthermore he tells me that he had intramuscular penicillin shots that his primary care office today. However he also states that the wrap on his right leg fell down shortly after leaving clinic last week. He developed a large blister that was present when he came in for a nurse visit later in the week and then he developed intense discomfort around this area.He tells me he is using his compression pumps 03/16/18; the patient has completed his doxycycline. The infectious part of this/cellulitis in the left heel area  left popliteal area is a lot better. He has 2 open areas on the right calf. Still areas on the left calf but this is a lot better as well. 03/24/18; the patient arrives complaining of pain in the left popliteal area again. He thinks some of this is wrap injury. He has no open area on the right leg and really no open area on the left calf either except for the popliteal area. He claims to be compliant with the compression pumps 03/31/18; I gave him doxycycline last week because of cellulitis in the left popliteal area. This is a lot better although the surface epithelium is denuded off and response to this. He arrives today with uncontrolled edema in the right calf area as well as a fingernail injury in the right lateral calf. There is only a few open areas on the left 04/06/18; I gave him amoxicillin doxycycline over the last 2 weeks that the amoxicillin should be completing currently. He is not complaining of any pain or systemic symptoms. The only open areas see has is on the right lateral lower leg paradoxically I cannot see anything on the left lower leg. He tells me he is using his compression pumps twice a day on most days. Silver alginate to the wounds that are open under 4 layer compression 04/13/18; he completed antibiotics  and has no new complaints. Using his compression pumps. Silver alginate that anything that's opened 04/20/18; he is using his compression pumps religiously. Silver alginate 4 layer compression anything that's opened. He comes in today with no open wounds on the left leg but 3 on the right including a new one posteriorly. He has 2 on the right lateral and one on the right posterior. He likes Unna boots on the right leg for reasons that aren't really clear we had the usual 4 layer compression on the left. It may be necessary to move to the 4 layer compression on the right however for now I left them in the Unna boots 04/27/18; he is using his compression pumps at least once a day. He has still the wounds on the right lateral calf. The area right posteriorly has closed. He does not have an open wound on the left under 4 layer compression however on the dorsal left foot just proximal to the toes and the left third toe 2 small open areas were identified 05/11/18; he has not uses compression pumps. The areas on the right lateral calf have coalesced into one large wound necrotic surface. On the left side he has one small wound anteriorly however the edema is now weeping out of a large part of his left leg. He says he wasn't using his pumps because of the weeping fluid. I explained to him that this is the time he needs to pump more 05/18/18; patient states he is using his compression pumps twice a day. The area on the right lateral large wound albeit superficial. On the left side he has innumerable number of small new wounds on the left calf particularly laterally but several anteriorly and medially. All these appear to have healthy granulated base these look like the remnants of blisters however they occurred under compression. The patient arrives in clinic today with his legs somewhat better. There is certainly less edema, less multiple open areas on the left calf and the right anterior leg looks somewhat better  as well superficial and a little smaller. However he relates pain and erythema over the last 3-4 days in the thigh  and I looked at this today. He has not been systemically unwell no fever no chills no change in blood sugar values 05/25/18; comes in today in a better state. The severe cellulitis on his left leg seems better with the Keflex. Not as tender. He has not been systemically unwell ooHard to find an open wound on the left lower leg using his compression pumps twice a day ooThe confluent wounds on his right lateral calf somewhat better looking. These will ultimately need debridement I didn't do this today. 06/01/18; the severe cellulitis on the left anterior thigh has resolved and he is completed his Keflex. ooThere is no open wound on the left leg however there is a superficial excoriation at the base of the third toe dorsally. Skin on the bottom of his left foot is macerated looking. ooThe left the wounds on the lateral right leg actually looks some better although he did require debridement of the top half of this wound area with an open curet 06/09/18 on evaluation today patient appears to be doing poorly in regard to his right lower extremity in particular this appears to likely be infected he has very thick purulent discharge along with a bright green tent to the discharge. This makes me concerned about the possibility of pseudomonas. He's also having increased discomfort at this point on evaluation. Fortunately there does not appear to be any evidence of infection spreading to the other location at this time. 06/16/18 on evaluation today patient appears to actually be doing fairly well. His ulcer has actually diminished in size quite significantly at this point which is good news. Nonetheless he still does have some evidence of infection he did see infectious disease this morning before coming here for his appointment. I did review the results of their evaluation and their note today. They  did actually have him discontinue the Cipro and initiate treatment with linezolid at this time. He is doing this for the next seven days and they recommended a follow-up in four months with them. He is the keep a log of the need for intermittent antibiotic therapy between now and when he falls back up with infectious disease. This will help them gaze what exactly they need to do to try and help them out. 06/23/18; the patient arrives today with no open wounds on the left leg and left third toe healed. He is been using his compression pumps twice a day. On the right lateral leg he still has a sizable wound but this is a lot better than last time I saw this. 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 Has been using silver alginate under 4 layer compression 06/30/18; the only open wound he has is on the right lateral leg and this looks healthy. No debridement is required. We have been using silver alginate. He does not have an open wound on the left leg. There is apparently some drainage from the dorsal proximal third toe on the left although I see no open wound here. 07/03/18 on evaluation today patient was actually here just for a nurse visit rapid change. However when he was here on Wednesday for his rat change due to having been healed on the left and then developing blisters we initiated the wrap again knowing that he would be back today for [place] to reevaluate and see were at. Unfortunately he has developed some cellulitis into the proximal portion  of his right lower extremity even into the region of his thigh. 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 He was placed on one as what at that point. Nonetheless he is done with that and has been tolerating it well otherwise. Doxycycline which in the past really did not seem to be effective for him. Nonetheless I  think the best option may be for [place] to definitely reinitiate the antibiotics for a longer period of time. 07/07/18; since I last saw this patient a week ago he has had a difficult time. At that point he did not have an open wound on his left leg. We transitioned him into juxta light stockings. He was apparently in the clinic the next day with blisters on the left lateral and left medial lower calf. He also had weeping edema fluid. He was put back into a compression wrap. He was also in the clinic on Friday with intense erythema in his right thigh. Per the patient he was started on Bactrim however that didn't work at all in terms of relieving his pain and swelling. He has taken 3 doxycycline that he had left over from last time and that seems to of helped. He has blistering on the right thigh as well. 07/14/18; the erythema on his right thigh has gotten better with doxycycline that he is finishing. 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 Presumably this cellulitis in the thigh was not related to that although I think this is a bit concerning going forward. He still has an area on the right lateral calf the blister on the right medial calf just below the knee that was discussed above. On the left 2 small open areas left medial and left lateral. Edema control is adequate. He is using his compression pumps twice a day 07/20/18; continued improvement in the condition of both legs especially the edema in his bilateral thighs. He tells me he is been losing weight through a combination of diet and exercise. He is using his compression pumps twice a day. So overall she made to the remaining wounds 07/27/2018; continued improvement in condition of both legs. His edema is well controlled. The area on the right lateral leg is just about closed he had one blisters show up on the medial left upper calf. We have him in 4 layer compression. He is  going on a 10-day trip to [place], [place]. He will be driving. He wants to wear Unna boots because of the lessening amount of constriction. He will not use compression pumps while he is away 08/05/18 on evaluation today patient actually appears to be doing decently well all things considered in regard to his bilateral lower extremities. The worst ulcer is actually only posterior aspect of his left lower extremity with a four layer compression wrap cut into his leg a couple weeks back. He did have a trip and actually had [employer] for the trip that he is worn since he was last here. Nonetheless he feels like the [employer] actually do better for him his swelling is up a little bit but he also with his trip was not taking his Lasix on a regular set schedule like he was supposed to be. He states that obviously the reason being that he cannot drive and keep going without having to urinate too frequently which  makes it difficult. He did not have his pumps with him while he was away either which I think also maybe playing a role here too. 08/13/2018; the patient only has a small open wound on the right lateral calf which is a big improvement in the last month or 2. He also has the area posteriorly just below the posterior fossa on the left which I think was a wrap injury from several weeks ago. He has no current evidence of cellulitis. He tells me he is back into his compression pumps twice a day. He also tells me that while he was at the laundromat somebody stole a section of his extremitease stockings 08/20/2018; back in the clinic with a much improved state. He only has small areas on the right lateral mid calf which is just about healed. This was is more substantial area for quite a prolonged period of time. He has a small open area on the left anterior tibia. The area on the posterior calf just below the popliteal fossa is closed today. He is using his compression pumps  twice a day 08/28/2018; patient has no open wound on the right leg. He has a smattering of open areas on the calf with some weeping lymphedema. More problematically than that it looks as though his wraps of slipped down in his usual he has very angry upper area of edema just below the right medial knee and on the right lateral calf. He has no open area on his feet. The patient is traveling to [facility] next week. I will send him in an antibiotic. We will continue to wrap the right leg. We ordered extremitease stockings for him last week and I plan to transition the right leg to a stocking when he gets home which will be in 10 days time. As usual he is very reluctant to take his pumps with him when he travels 09/07/2018; patient returns from [facility]. He shows me a picture of his left leg in the mid part of his trip last week with intense fire engine erythema. The picture look bad enough I would have considered sending him to the hospital. Instead he went to the wound care center in [facility]. They did not prescribe him antibiotics but he did take some doxycycline he had leftover from a previous visit. I had given him trimethoprim sulfamethoxazole before he left this did not work according to the patient. This is resulted in some improvement fortunately. He comes back with a large wound on the left posterior calf. Smaller area on the left anterior tibia. Denuded blisters on the dorsal left foot over his toes. Does not have much in the way of wounds on the right leg although he does have a very tender area on the right posterior area just below the popliteal fossa also suggestive of infection. He promises me he is back on his pumps twice a day 09/15/2018; the intense cellulitis in his left lower calf is a lot better. The wound area on the posterior left calf is also so better. However he has reasonably extensive wounds on the dorsal aspect of his second and third toes and the proximal  foot just at the base of the toes. There is nothing open on the right leg 09/22/2018; the patient has excellent edema control in his legs bilaterally. He is using his external compression pumps twice a day. He has no open area on the right leg and only the areas in the left foot dorsally  second and third toe area on the left side. He does not have any signs of active cellulitis. 10/06/2018; the patient has good edema control bilaterally. He has no open wound on the right leg. There is a blister in the posterior aspect of his left calf that we had to deal with today. He is using his compression pumps twice a day. There is no signs of active cellulitis. We have been using silver alginate to the wound areas. He still has vulnerable areas on the base of his left first second toes dorsally He has a his extremities stockings and we are going to transition him today into the stocking on the right leg. He is cautioned that he will need to continue to use the compression pumps twice a day. If he notices uncontrolled edema in the right leg he may need to go to 3 times a day. 10/13/2018; the patient came in for a nurse check on Friday he has a large flaccid blister on the right medial calf just below the knee. We unroofed this. He has this and a new area underneath the posterior mid calf which was undoubtedly a blister as well. He also has several small areas on the right which is the area we put his extremities stocking on. 10/19/2018; the patient went to see infectious disease this morning I am not sure if that was a routine follow-up in any case the doxycycline I had given him was discontinued and started on linezolid. He has not started this. It is easy to look at his left calf and the inflammation and think this is cellulitis however he is very tender in the tissue just below the popliteal fossa and I have no doubt that there is infection going on here. He states the problem he is having is that with the  compression pumps the edema goes down and then starts walking the wrap falls down. We will see if we can adhere this. He has 1 or 2 minuscule open areas on the right still areas that are weeping on the posterior left calf, the base of his left second and third toes 10/26/18; back today in clinic with quite of skin breakdown in his left anterior leg. This may have been infection the area below the popliteal fossa seems a lot better however tremendous epithelial loss on the left anterior mid tibia area over quite inexpensive tissue. He has 2 blisters on the right side but no other open wound here. 10/29/2018; came in urgently to see [place] today and we worked him in for review. He states that the 4 layer compression on the right leg caused pain he had to cut it down to roughly his mid calf this caused swelling above the wrap and he has blisters and skin breakdown today. As a result of the pain he has not been using his pumps. Both legs are a lot more edematous and there is a lot of weeping fluid. 11/02/18; arrives in clinic with continued difficulties in the right leg> left. Leg is swollen and painful. multiple skin blisters and new open areas especially laterally. He has not been using his pumps on the right leg. 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 He is not systemically unwell. 11/09/2018; the patient claims he is being compliant with his pumps. He is finished the doxycycline I gave him last week. 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 This was resistant to doxycycline. Nevertheless he states the pain in the leg is a lot better which makes me wonder if the cultured organism was not really what was causing the problem nevertheless this is a very dangerous organism to be culturing out of any wound. His right leg is still a lot larger than the left. He is using an Radio broadcast assistant on this area, he  blames a 4-layer compression for causing the original skin breakdown which I doubt is true however I cannot talk him out of it. We have been using silver alginate to all of these areas which were initially blisters 11/16/2018; patient is being compliant with his external compression pumps at twice a day. Miraculously he arrives in clinic today with absolutely no open wounds. He has better edema control on the left where he has been using 4 layer compression versus wound of wounds on the right and I pointed this out to him. There is no inflammation in the skin in his lower legs which is also somewhat unusual for him. There is no open wounds on the dorsal left foot. He has extremitease stockings at home and I have asked him to bring these in next week. 11/25/18 patient's lower extremity on examination today on the left appears for the most part to be wound free. He does have an open wound on the lateral aspect of the right lower extremity but this is minimal compared to what I've seen in past. He does request that we go ahead and wrap the left leg as well even though there's nothing open just so hopefully it will not reopen in short order. 1/28; patient has superficial open wounds on the right lateral calf left anterior calf and left posterior calf. His edema control is adequate. 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 We have been using silver alginate as the primary dressing. He claims compliance with his compression pumps 2/4; patient has superficial open wounds on numerous areas of his left calf and again one on the left dorsal foot. The areas on the right lateral calf have healed. The cellulitis that I gave him doxycycline for last week is also resolved this was mostly on the left anterior calf just below the tibial tuberosity. His edema looks fairly well-controlled. He tells me he went to see his primary doctor  today and had blood work ordered 2/11; once again he has several open areas on the left calf left tibial area. Most of these are small and appear to have healthy granulation. He does not have anything open on the right. The edema and control in his thighs is pretty good which is usually a good indication he has been using his pumps as requested. 2/18; he continues to have several small areas on the left calf and left tibial area. Most of these are small healthy granulation. We put him in his stocking on the right leg last week and he arrives with a superficial open area over the right upper tibia and a fairly large area on the right lateral tibia in similar condition. His edema control actually does not look too bad, he claims to be using his compression pumps twice a day 2/25. Continued small areas on the left calf and left tibial area. New areas especially on the right are identified just below the tibial tuberosity and on the right upper tibia itself. There are also areas of weeping  edema fluid even without an obvious wound. He does not have a considerable degree of lymphedema but clearly there is more edema here than his skin can handle. He states he is using the pumps twice a day. We have an Unna boot on the right and 4 layer compression on the left. 3/3; he continues to have an area on the right lateral calf and right posterior calf just below the popliteal fossa. There is a fair amount of tenderness around the wound on the popliteal fossa but I did not see any evidence of cellulitis, could just be that the wrap came down and rubbed in this area. ooHe does not have an open area on the left leg however there is an area on the left dorsal foot at the base of the third toe ooWe have been using silver alginate to all wound areas 3/10; he did not have an open area on his left leg last time he was here a week ago. T oday he arrives with a horizontal wound just below the tibial tuberosity and an area  on the left lateral calf. He has intense erythema and tenderness in this area. The area is on the right lateral calf and right posterior calf better than last week. We have been using silver alginate as usual 3/18 - Patient returns with 3 small open areas on left calf, and 1 small open area on right calf, the skin looks ok with no significant erythema, he continues the UNA boot on right and 4 layer compression on left. The right lateral calf wound is closed , the right posterior is small area. we will continue silver alginate to the areas. 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 He is been tolerating the dressing changes without complication. Fortunately she has made excellent improvement in regard to the overall status of his wounds. Unfortunately every time we cease wrapping him he ends up reopening in causing more significant issues at that point. Again I'm unsure of the best direction to take although I think the lymphedema clinic may be appropriate for him. 02/03/19 on evaluation today patient appears to be doing well in regard to the wounds that we saw him for last week unfortunately he has a new area on the proximal portion of his right medial/posterior lower extremity where the wrap somewhat slowed down and caused swelling and a blister to rub and open. Unfortunately this is the only opening that he has on either leg at this point. 02/17/19 on evaluation today patient's bilateral lower extremities appear to be doing well. He still completely healed in regard to the left lower extremity. In regard to the right lower extremity the area where the wrap and slid down and caused the blister still seems to be slightly open although this is dramatically better than during the last evaluation two weeks ago.  I'm very pleased with the way this stands overall. 03/03/19 on evaluation today patient appears to be doing well in regard to his right lower extremity in general although he did have a new blister open this does not appear to be showing any evidence of active infection at this time. Fortunately there's No fevers, chills, nausea, or vomiting noted at this time. Overall I feel like he is making good progress it does feel like that the right leg  will we perform the Henriette Combs seems to do with a bit better than three layer wrap on the left which slid down on him. We may switch to doing bilateral in the book wraps. 5/4; I have not seen Mr. Mays in quite some time. According to our case manager he did not have an open wound on his left leg last week. He had 1 remaining wound on the right posterior medial calf. He arrives today with multiple openings on the left leg probably were blisters and/or wrap injuries from Unna boots. I do not think the Unna boot's will provide adequate compression on the left. I am also not clear about the frequency he is using the compression pumps. 03/17/19 on evaluation today patient appears to be doing excellent in regard to his lower extremities compared to last week's evaluation apparently. He had gotten significantly worse last week which is unfortunate. The [employer] wrap on the left did not seem to do very well for him at all and in fact it didn't control his swelling significantly enough he had an additional outbreak. Subsequently we go back to the four layer compression wrap on the left. This is good news. At least in that he is doing better and the wound seem to be killing him. He still has not heard anything from the lymphedema clinic. 03/24/19 on evaluation today patient actually appears to be doing much better in regard to his bilateral lower Trinity as compared to last week when I saw him. Fortunately there's no signs of active infection at this time. He has been  tolerating the dressing changes without complication. Overall I'm extremely pleased with the progress and appearance in general. 04/07/19 on evaluation today patient appears to be doing well in regard to his bilateral lower extremities. His swelling is significantly down from where it was previous. With that being said he does have a couple blisters still open at this point but fortunately nothing that seems to be too severe and again the majority of the larger openings has healed at this time. 04/14/19 on evaluation today patient actually appears to be doing quite well in regard to his bilateral lower extremities in fact I'm not even sure there's anything significantly open at this time at any site. Nonetheless he did have some trouble with these wraps where they are somewhat irritating him secondary to the fact that he has noted that the graph wasn't too close down to the end of this foot in a little bit short as well up to his knee. Otherwise things seem to be doing quite well. 04/21/19 upon evaluation today patient's wound bed actually showed evidence of being completely healed in regard to both lower extremities which is excellent news. There does not appear to be any signs of active infection which is also good news. I'm very pleased in this regard. No fevers, chills, nausea, or vomiting noted at this time. 04/28/19 on evaluation today patient appears to be doing a little bit worse in regard to both lower extremities on the left mainly due to the fact that when he went infection disease the wrap was not wrapped quite high enough he developed a blister above this. On the right he is a small open area of nothing too significant but again this is continuing to give him some trouble he has been were in the Velcro compression that he has at home. 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 He's  been tolerating the dressing changes  without complication. Fortunately there's no signs of active infection at this time. No fevers, chills, nausea, or vomiting noted at this time. We have been trying to get an appointment with her lymphedema clinic in [facility] but unfortunately nobody can get them on phone with not been able to even fax information over the patient likewise is not been able to get in touch with them. Overall I'm not sure exactly what's going on here with to reach out again today. 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 Still having a lot of drainage unfortunately. He tells me especially in the left but even on the right. There's no signs of active infection which is good news we've been using so ratcheted up to this point. 05/19/19 on evaluation today patient actually appears to be doing quite well with regard to his left lower extremity which is great news. Fortunately in regard to the right lower extremity has an issues with his wrap and he subsequently did remove this from what I'm understanding. Nonetheless long story short is what he had rewrapped once he removed it subsequently had maggots underneath this wrap whenever he came in for evaluation today. With that being said they were obviously completely cleaned away by the nursing staff. The visit today which is excellent news. However he does appear to potentially have some infection around the right ankle region where the maggots were located as well. He will likely require anabiotic therapy today. 05/26/19 on evaluation today patient actually appears to be doing much better in regard to his bilateral lower extremities. I feel like the infection is under much better control. With that being said there were maggots noted when the wrap was removed yet again today. Again this could have potentially been left over from previous although at this time there does not  appear to be any signs of significant drainage there was obviously on the wrap some drainage as well this contracted gnats or otherwise. Either way I do not see anything that appears to be doing worse in my pinion and in fact I think his drainage has slowed down quite significantly likely mainly due to the fact to his infection being under better control. 06/02/2019 on evaluation today patient actually appears to be doing well with regard to his bilateral lower extremities there is no signs of active infection at this time which is great news. With that being said he does have several open areas more so on the right than the left but nonetheless these are all significantly better than previously noted. 06/09/2019 on evaluation today patient actually appears to be doing well. His wrap stayed up and he did not cause any problems he had more drainage on the right compared to the left but overall I do not see any major issues at this time which is great news. 06/16/2019 on evaluation today patient appears to be doing excellent with regard to his lower extremities the only area that is open is a new blister that can have opened as of today on the medial ankle on the left. Other than this he really seems to be doing great I see no major issues at this point. 06/23/2019 on evaluation today patient appears to be doing quite well with regard to his bilateral lower extremities. In fact he actually appears to be almost completely healed there is a small area of weeping noted  of the right lower extremity just above the ankle. Nonetheless fortunately there is no signs of active infection at this time which is good news. No fevers, chills, nausea, vomiting, or diarrhea. 8/24; the patient arrived for a nurse visit today but complained of very significant pain in the left leg and therefore I was asked to look at this. Noted that he did not have an open area on the left leg last week nevertheless this was wrapped. The  patient states that he is not been able to put his compression pumps on the left leg because of the discomfort. He has not been systemically unwell 06/30/2019 on evaluation today patient unfortunately despite being excellent last week is doing much worse with regard to his left lower extremity today. In fact he had to come in for a nurse on Monday where his left leg had to be rewrapped due to excessive weeping Dr. Leanord Hawking placed him on doxycycline at that point. Fortunately there is no signs of active infection Systemically at this time which is good news. 07/07/2019 in regard to the patient's wounds today he actually seems to be doing well with his right lower extremity there really is nothing open or draining at this point this is great news. Unfortunately the left lower extremity is given him additional trouble at this time. There does not appear to be any signs of active infection nonetheless he does have a lot of edema and swelling noted at this point as well as blistering all of which has led to a much more poor appearing leg at this time compared to where it was 2 weeks ago when it was almost completely healed. Obviously this is a little discouraging for the patient. He is try to contact the lymphedema clinic in [place] he has not been able to get through to them. 07/14/2019 on evaluation today patient actually appears to be doing slightly better with regard to his left lower extremity ulcers. Overall I do feel like at least at the top of the wrap that we have been placing this area has healed quite nicely and looks much better. The remainder of the leg is showing signs of improvement. Unfortunately in the thigh area he still has an open region on the left and again on the right he has been utilizing just a Band-Aid on an area that also opened on the thigh. Again this is an area that were not able to wrap although we did do an Ace wrap to provide some compression that something that obviously is  a little less effective than the compression wraps we have been using on the lower portion of the leg. He does have an appointment with the lymphedema clinic in [facility] on Friday. 07/21/2019 on evaluation today patient appears to be doing better with regard to his lower extremity ulcers. He has been tolerating the dressing changes without complication. Fortunately there is no signs of active infection at this time. No fevers, chills, nausea, vomiting, or diarrhea. I did receive the paperwork from the physical therapist at the lymphedema clinic in [place]. Subsequently I signed off on that this morning and sent that back to him for further progression with the treatment plan. 07/28/2019 on evaluation today patient appears to be doing very well with regard to his right lower extremity where I do not see any open wounds at this point. Fortunately he is feeling great as far as that is concerned as well. In regard to the left lower extremity he has been having  issues with still several areas of weeping and edema although the upper leg is doing better his lower leg still I think is going require the compression wrap at this time. No fevers, chills, nausea, vomiting, or diarrhea. 08/04/2019 on evaluation today patient unfortunately is having new wounds on the right lower extremity. Again we have been using Unna boot wrap on that side. We switched him to using his juxta lite wrap at home. With that being said he tells me he has been using it although his legs extremely swollen and to be honest really does not appear that he has been. I cannot know that for sure however. Nonetheless he has multiple new wounds on the right lower extremity at this time. Obviously we will have to see about getting this rewrapped for him today. 08/11/2019 on evaluation today patient appears to be doing fairly well with regard to his wounds. He has been tolerating the dressing changes including the compression wraps without  complication. He still has a lot of edema in his upper thigh regions bilaterally he is supposed to be seeing the lymphedema clinic on the 15th of this month once his wraps arrive for the upper part of his legs. 08/18/2019 on evaluation today patient appears to be doing well with regard to his bilateral lower extremities at this point. He has been tolerating the dressing changes without complication. Fortunately there is no signs of active infection which is also good news. He does have a couple weeping areas on the first and second toe of the right foot he also has just a small area on the left foot upper leg and a small area on the left lower leg but overall he is doing quite well in my opinion. He is supposed to be getting his wraps shortly in fact tomorrow and then subsequently is seeing the lymphedema clinic next Wednesday on the 21st. Of note he is also leaving on the 25th to go on vacation for a week to the beach. For that reason and since there is some uncertainty about what there can be doing at lymphedema clinic next Wednesday I am get a make an appointment for next Friday here for [place] to see what we need to do for him prior to him leaving for vacation. 10/23; patient arrives in considerable pain predominantly in the upper posterior calf just distal to the popliteal fossa also in the wound anteriorly above the major wound. This is probably cellulitis and he has had this recurrently in the past. He has no open wound on the right side and he has had an Radio broadcast assistant in that area. Finally I note that he has an area on the left posterior calf which by enlarge is mostly epithelialized. This protrudes beyond the borders of the surrounding skin in the setting of dry scaly skin and lymphedema. The patient is leaving for [name] on Sunday. Per his longstanding pattern, he will not take his compression pumps with him predominantly out of fear that they will be stolen. He therefore asked that we put a  Unna boot back on the right leg. He will also contact the wound care center in [facility] to see if they can change his dressing in the mid week. 11/3; patient returned from his vacation to [facility]. He was seen on 1 occasion at their wound care center. They did a 2 layer compression system as they did not have our 4-layer wrap. I am not completely certain what they put on the  wounds. They did not change the Unna boot on the right. The patient is also seeing a lymphedema specialist physical therapist in [place]. It appears that he has some compression sleeve for his thighs which indeed look quite a bit better than I am used to seeing. He pumps over these with his external compression pumps. 11/10; the patient has a new wound on the right medial thigh otherwise there is no open areas on the right. He has an area on the left leg posteriorly anteriorly and medially and an area over the left second toe. We have been using silver alginate. He thinks the injury on his thigh is secondary to friction from the compression sleeve he has. 11/17; the patient has a new wound on the right medial thigh last week. He thinks this is because he did not have a underlying stocking for his thigh juxta lite apparatus. He now has this. The area is fairly large and somewhat angry but I do not think he has underlying cellulitis. ooHe has a intact blister on the right anterior tibial area. ooSmall wound on the right great toe dorsally ooSmall area on the medial left calf. 11/30; the patient does not have any open areas on his right leg and we did not take his juxta lite stocking off. However he states that on Friday his compression wrap fell down lodging around his upper mid calf area. As usual this creates a lot of problems for him. He called urgently today to be seen for a nurse visit however the nurse visit turned into a provider visit because of extreme erythema and pain in the left anterior tibia extending  laterally and posteriorly. The area that is problematic is extensive 10/06/2019 upon evaluation today patient actually appears to be doing poorly in regard to his left lower extremity. He Dr. Leanord Hawking did place him on doxycycline this past Monday apparently due to the fact that he was doing much worse in regard to this left leg. Fortunately the doxycycline does seem to be helping. Unfortunately we are still having a very difficult time getting his edema under any type of control in order to anticipate discharge at some point. The only way were really able to control his lymphedema really is with compression wraps and that has only even seemingly temporary. He has been seeing a lymphedema clinic they are trying to help in this regard but still this has been somewhat frustrating in general for the patient. 10/13/19 on evaluation today patient appears to be doing excellent with regard to his right lower extremity as far as the wounds are concerned. His swelling is still quite extensive unfortunately. He is still having a lot of drainage from the thigh areas bilaterally which is unfortunate. He's been going to lymphedema clinic but again he still really does not have this edema under control as far as his lower extremities are concern. With regard to his left lower extremity this seems to be improving and I do believe the doxycycline has been of benefit for him. He is about to complete the doxycycline. 10/20/2019 on evaluation today patient appears to be doing poorly in regard to his bilateral lower extremities. More in the right thigh he has a lot of irritation at this site unfortunately. In regard to the left lower extremity the wrap was not quite as high it appears and does seem to have caused him some trouble as well. Fortunately there is no evidence of systemic infection though he does have some blue-green drainage which  has me concerned for the possibility of Pseudomonas. He tells me he is previously  taking Cipro without complications and he really does not care for Levaquin however due to some of the side effects he has. He is not allergic to any medications specifically antibiotics that were aware of. 10/27/2019 on evaluation today patient actually does appear to be for the most part doing better when compared to last week's evaluation. With that being said he still has multiple open wounds over the bilateral lower extremities. He actually forgot to start taking the Cipro and states that he still has the whole bottle. He does have several new blisters on left lower extremity today I think I would recommend he go ahead and take the Cipro based on what I am seeing at this point. 12/30-Patient comes at 1 week visit, 4 layer compression wraps on the left and Unna boot on the right, primary dressing Xtrasorb and silver alginate. Patient is taking his Cipro and has a few more days left probably 5-6, and the legs are doing better. He states he is using his compressions devices which I believe he has 11/10/2019 on evaluation today patient actually appears to be much better than last time I saw him 2 weeks ago. His wounds are significantly improved and overall I am very pleased in this regard. Fortunately there is no signs of active infection at this time. He is just a couple of days away from completing Cipro. Overall his edema is much better he has been using his lymphedema pumps which I think is also helping at this point. 11/17/2019 on evaluation today patient appears to be doing excellent in regard to his wounds in general. His legs are swollen but not nearly as much as they have been in the past. Fortunately he is tolerating the compression wraps without complication. No fevers, chills, nausea, vomiting, or diarrhea. He does have some erythema however in the distal portion of his right lower extremity specifically around the forefoot and toes there is a little bit of warmth here as well. 11/24/2019 on  evaluation today patient appears to be doing well with regard to his right lower extremity I really do not see any open wounds at this point. His left lower extremity does have several open areas and his right medial thigh also is open. Other than this however overall the patient seems to be making good progress and I am very pleased at this point. 12/01/2019 on evaluation today patient appears to be doing poorly at this point in regard to his left lower extremity has several new blisters despite the fact that we have him in compression wraps. In fact he had a 4-layer compression wrap, his upper thigh wrapped from lymphedema clinic, and a juxta light over top of the 4 layer compression wrap the lymphedema clinic applied and despite all this he still develop blisters underneath. Obviously this does have me concerned about the fact that unfortunately despite what we are doing to try to get wounds healed he continues to have new areas arise I do not think he is ever good to be at the point where he can realistically just use wraps at home to keep things under control. Typically when we heal him it takes about 1-2 days before he is back in the clinic with severe breakdown and blistering of his lower extremities bilaterally. This is happened numerous times in the past. Unfortunately I think that we may need some help as far as overall fluid overload to  kind of limit what we are seeing and get things under better control. 12/08/2019 on evaluation today patient presents for follow-up concerning his ongoing bilateral lower extremity edema. Unfortunately he is still having quite a bit of swelling the compression wraps are controlling this to some degree but he did see Dr. [name] his cardiologist I do have that available for review today as far as the appointment was concerned that was on 12/06/2019. Obviously that she has been 2 days ago. The patient states that he is only been taking the Lasix 80 mg 1 time a day he  had told me previously he was taking this twice a day. Nonetheless Dr. [name] recommended this be up to 80 mg 2 times a day for the patient as he did appear to be fluid overloaded. With that being said the patient states he did this yesterday and he was unable to go anywhere or do anything due to the fact that he was constantly having to urinate. Nonetheless I think that this is still good to be something that is important for him as far as trying to get his edema under control at all things that he is going to be able to just expect his wounds to get under control and things to be better without going through at least a period of time where he is trying to stabilize his fluid management in general and I think increasing the Lasix is likely the first step here. It was also mentioned the possibility that the patient may require metolazone. With that being said he wanted to have the patient take Lasix twice a day first and then reevaluating 2 months to see where things stand. 12/15/2019 upon evaluation today patient appears to be doing regard to his legs although his toes are showing some signs of weeping especially on the left at this point to some degree on the right. There does not appear to be any signs of active infection and overall I do feel like the compression wraps are doing well for him but he has not been able to take the Lasix at home and the increased dose that Dr. [name] recommended. He tells me that just not go to be feasible for him. Nonetheless I think in this case he should probably send a message to Dr. [name] in order to discuss options from the standpoint of possible admission to get the fluid off or otherwise going forward. 12/22/2019 upon evaluation today patient appears to be doing fairly well with regard to his lower extremities at this point. In fact he would be doing excellent if it was not for the fact that his right anterior thigh apparently had an allergic reaction to adhesive  tape that he used. The wound itself that we have been monitoring actually appears to be healed. There is a lot of irritation at this point. 12/29/2019 upon evaluation today patient appears to be doing well in regard to his lower extremities. His left medial thigh is open and somewhat draining today but this is the only region that is open the right has done much better with the treatment utilizing the steroid cream that I prescribed for him last week. Overall I am pleased in that regard. Fortunately there is no signs of active infection at this time. No fevers, chills, nausea, vomiting, or diarrhea. 01/05/2020 upon evaluation today patient appears to be doing more poorly in regard to his right lower extremity at this point upon evaluation today. Unfortunately he continues to have issues in  this regard and I think the biggest issue is controlling his edema. This obviously is not very well controlled at this point is been recommended that he use the Lasix twice a day but he has not been able to do that. Unfortunately I think this is leading to an issue where honestly he is not really able to effectively control his edema and therefore the wounds really are not doing significantly better. I do not think that he is going to be able to keep things under good control unless he is able to control his edema much better. I discussed this again in great detail with him today. 01/12/2020 good news is patient actually appears to be doing quite well today at this point. He does have an appointment with lymphedema clinic tomorrow. His legs appear healed and the toe on the left is almost completely healed. In general I am very pleased with how things stand at this point. 01/19/2020 upon evaluation today patient appears to actually be doing well in regard to his lower extremities there is nothing open at this point. Fortunately he has done extremely well more recently. Has been seeing lymphedema clinic as well. With that  being said he has Velcro wraps for his lower legs as well as his upper legs. The only wound really is on his toe which is the right great toe and this is barely anything even there. With all that being said I think it is good to be appropriate today to go ahead and switch him over to the Velcro compression wraps. 01/26/2020 upon evaluation today patient appears to be doing worse with regard to his lower extremities after last week switch him to Velcro compression wraps. Unfortunately he lasted less than 24 hours he did not have the sock portion of his Velcro wrap on the left leg and subsequently developed a blister underneath the Velcro portion. Obviously this is not good and not what we were looking for at this point. He states the lymphedema clinic did tell him to wear the wrap for 23 hours and take him off for 1 I am okay with that plan but again right now we got a get things back under control again he may have some cellulitis noted as well. 02/02/2020 upon evaluation today patient unfortunately appears to have several areas of blistering on his bilateral lower extremities today mainly on the feet. His legs do seem to be doing somewhat better which is good news. Fortunately there is no evidence of active infection at this time. No fevers, chills, nausea, vomiting, or diarrhea. 02/16/2020 upon evaluation today patient appears to be doing well at this time with regard to his legs. He has a couple weeping areas on his toes but for the most part everything is doing better and does appear to be sealed up on his legs which is excellent news. We can continue with wrapping him at this point as he had every time we discontinue the wraps he just breaks out with new wounds. There is really no point in is going forward with this at this point. 03/08/2020 upon evaluation today patient actually appears to be doing quite well with regard to his lower extremity ulcers. He has just a very superficial and really almost  nonexistent blister on the left lower extremity he has in general done very well with the compression wraps. With that being said I do not see any signs of infection at this time which is good news. 03/29/2020 upon evaluation today patient  appears to be doing well with regard to his wounds currently except for where he had several new areas that opened up due to some of the wrap slipping and causing him trouble. He states he did not realize they had slipped. Nonetheless he has a 1 area on the right and 3 new areas on the left. Fortunately there is no signs of active infection at this time which is great news. 04/05/2020 upon evaluation today patient actually appears to be doing quite well in general in regard to his legs currently. Fortunately there is no signs of active infection at this time. No fevers, chills, nausea, vomiting, or diarrhea. He tells me next week that he will actually be seen in the lymphedema clinic on Thursday at 10 AM I see him on Wednesday next week. 04/12/2020 upon evaluation today patient appears to be doing very well with regard to his lower extremities bilaterally. In fact he does not appear to have any open wounds at this point which is good news. Fortunately there is no signs of active infection at this time. No fevers, chills, nausea, vomiting, or diarrhea. 04/19/2020 upon evaluation today patient appears to be doing well with regard to his wounds currently on the bilateral lower extremities. There does not appear to be any signs of active infection at this time. Fortunately there is no evidence of systemic infection and overall very pleased at this point. Nonetheless after I held him out last week he literally had blisters the next morning already which swelled up with him being right back here in the clinic. Overall I think that he is just not can be able to be discharged with his legs the way they are he is much to volume overloaded as far as fluid is concerned and that was  discussed with him today of also discussed this but should try the clinic nurse manager as well as Dr. Leanord Hawking. 04/26/2020 upon evaluation today patient appears to be doing better with regard to his wounds currently. He is making some progress and overall swelling is under good control with the compression wraps. Fortunately there is no evidence of active infection at this time. 05/10/2020 on evaluation today patient appears to be doing overall well in regard to his lower extremities bilaterally. He is Tolerating the compression wraps without complication and with what we are seeing currently I feel like that he is making excellent progress. There is no signs of active infection at this time. 05/24/2020 upon evaluation today patient appears to be doing well in regard to his legs. The swelling is actually quite a bit down compared to where it has been in the past. Fortunately there is no sign of active infection at this time which is also good news. With that being said he does have several wounds on his toes that have opened up at this point. 05/31/2020 upon evaluation today patient appears to be doing well with regard to his legs bilaterally where he really has no significant fluid buildup at this point overall he seems to be doing quite well. Very pleased in this regard. With regard to his toes these also seem to be drying up which is excellent. We have continue to wrap him as every time we tried as a transition to the juxta light wraps things just do not seem to get any better. 06/07/2020 upon evaluation today patient appears to be doing well with regard to his right leg at this point. Unfortunately left leg has a lot of blistering he  tells me the wrap started to slide down on him when he tried to put his other Velcro wrap over top of it to help keep things in order but nonetheless still had some issues. 06/14/2020 on evaluation today patient appears to be doing well with regard to his lower extremity ulcers  and foot ulcers at this point. I feel like everything is actually showing signs of improvement which is great news overall there is no signs of active infection at this time. No fevers, chills, nausea, vomiting, or diarrhea. 06/21/2020 on evaluation today patient actually appears to be doing okay in regard to his wounds in general. With that being said the biggest issue I see is on his right foot in particular the first and second toe seem to be doing a little worse due to the fact this is staying very wet. I think he is probably getting need to change out his dressings a couple times in between each week when we see him in regard to his toes in order to keep this drier based on the location and how this is proceeding. 06/28/2020 on evaluation today patient appears to be doing a little bit more poorly overall in regard to the appearance of the skin I am actually somewhat concerned about the possibility of him having a little bit of an infection here. We discussed the course of potentially giving him a doxycycline prescription which he is taken previously with good result. With that being said I do believe that this is potentially mild and at this point easily fixed. I just do not want anything to get any worse. 07/12/2020 upon evaluation today patient actually appears to be making some progress with regard to his legs which is great news there does not appear to be any evidence of active infection. Overall very pleased with where things stand. 07/26/2020 upon evaluation today patient appears to be doing well with regard to his leg ulcers and toe ulcers at this point. He has been tolerating the compression wraps without complication overall very pleased in this regard. 08/09/2020 upon evaluation today patient appears to be doing well with regard to his lower extremities bilaterally. Fortunately there is no signs of active infection overall I am pleased with where things stand. 08/23/2020 on evaluation  today patient appears to be doing well with regard to his wound. He has been tolerating the dressing changes without complication. Fortunately there is no signs of active infection at this time. Overall his legs seem to be doing quite well which is great news and I am very pleased in that regard. No fevers, chills, nausea, vomiting, or diarrhea. 09/13/2020 upon evaluation today patient appears to be doing okay in regard to his lower extremities. He does have a fairly large blister on the right leg which I did remove the blister tissue from today so we can get this to dry out other than that however he seems to be doing quite well. There is no signs of active infection at this time. 09/27/2020 upon evaluation today patient appears to actually be doing some better in regard to his right leg. Fortunately signs of active infection at this time which is great news. No fevers, chills, nausea, vomiting, or diarrhea. 10/04/2020 upon evaluation today patient actually appears to be showing signs of improvement which is great news with regard to his leg ulcers. Fortunately there is no signs of active infection which is great news he is still taking the antibiotics currently. No fevers, chills, nausea, vomiting, or  diarrhea. 10/18/2020 on evaluation today patient appears to be doing well with regard to his legs currently. He has been tolerating the dressing changes including the wraps without complication. Fortunately there is no signs of active infection at this time. No fevers, chills, nausea, vomiting, or diarrhea. 10/25/2020 upon evaluation today patient appears to be doing decently well in regard to his wounds currently. He has been tolerating the dressing changes without complication. Overall I feel like he is making good progress albeit slow. Again this is something we can have to continue to wrap for some time to come most likely. 11/08/2020 upon evaluation today patient appears to be doing well with regard  to his wounds currently. He has been tolerating the dressing changes without complication is not currently on any antibiotics and he does not appear to show any signs of infection. He does continue to have a lot of drainage on the right leg not too severe but nonetheless this is very scattered. On the left leg this is looking to be much improved overall. 11/15/2020 upon evaluation today patient appears to be doing better with regard to his legs bilaterally. Especially the right leg which was much more significant last week. There does not appear to be any signs of active infection which is great news. No fevers, chills, nausea, vomiting, or diarrhea. 11/23/2019 upon evaluation today patient appears to be doing poorly still in regard to his lower extremities bilaterally. Unfortunately his right leg in particular appears to be doing much more poorly there is no signs really of infection this is not warm to touch but he does have a lot of drainage and weeping unfortunately. With that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

## 2021-03-22 NOTE — Progress Notes (Signed)
JODEY, BURBANO ([id number]) Visit Report for 03/21/2021 Arrival Information Details Patient Name: Date of Service: CO PRISCILLA, FINKLEA 03/21/2021 10:30 A M Medical Record Number: [id number] Patient Account Number: [id number] Date of Birth/Sex: Treating RN: August 16, 1951 (70 y.o. [name], [name] Primary Care Tysha Grismore: [name] Other Clinician: Referring Deandrea Vanpelt: Treating Crystian Frith/Extender: Jeannette Corpus in Treatment: 49 Visit Information History Since Last Visit Added or deleted any medications: No Patient Arrived: Walker Any new allergies or adverse reactions: No Arrival Time: 11:12 Had a fall or experienced change in No Accompanied By: self activities of daily living that may affect Transfer Assistance: None risk of falls: Patient Identification Verified: Yes Signs or symptoms of abuse/neglect since last visito No Secondary Verification Process Completed: Yes Hospitalized since last visit: No Patient Requires Transmission-Based Precautions: No Implantable device outside of the clinic excluding No Patient Has Alerts: Yes cellular tissue based products placed in the center since last visit: Has Dressing in Place as Prescribed: Yes Has Compression in Place as Prescribed: Yes Pain Present Now: No Electronic Signature(s) Signed: 03/22/2021 6:00:49 PM By: Deon Pilling Entered By: Deon Pilling on 03/21/2021 11:26:20 -------------------------------------------------------------------------------- Compression Therapy Details Patient Name: Date of Service: VIRGIE, CHERY. 03/21/2021 10:30 A M Medical Record Number: [id number] Patient Account Number: [id number] Date of Birth/Sex: Treating RN: August 07, 1951 (70 y.o. [name] Primary Care Danie Hannig: [name] Other Clinician: Referring Lenox Bink: Treating Allexis Bordenave/Extender: Jeannette Corpus in Treatment: 269 Compression Therapy Performed for Wound Assessment: Wound #195  Left,Anterior Lower Leg Performed By: Clinician Rhae Hammock, RN Compression Type: Four Layer Post Procedure Diagnosis Same as Pre-procedure Electronic Signature(s) Signed: 03/21/2021 6:10:17 PM By: Baruch Gouty RN, BSN Entered By: Baruch Gouty on 03/21/2021 11:50:27 -------------------------------------------------------------------------------- Compression Therapy Details Patient Name: Date of Service: [name], Sumeet J. 03/21/2021 10:30 A M Medical Record Number: [id number] Patient Account Number: [id number] Date of Birth/Sex: Treating RN: 21-Mar-1951 (70 y.o. [name] Primary Care Burdett Pinzon: [name] Other Clinician: Referring Sandip Power: Treating Mava Suares/Extender: Jeannette Corpus in Treatment: 269 Compression Therapy Performed for Wound Assessment: NonWound Condition Lymphedema - Right Leg Performed By: Clinician Rhae Hammock, RN Compression Type: Four Layer Post Procedure Diagnosis Same as Pre-procedure Electronic Signature(s) Signed: 03/21/2021 6:10:17 PM By: Baruch Gouty RN, BSN Entered By: Baruch Gouty on 03/21/2021 11:50:51 -------------------------------------------------------------------------------- Encounter Discharge Information Details Patient Name: Date of Service: [facility], Donivin J. 03/21/2021 10:30 A M Medical Record Number: [id number] Patient Account Number: [id number] Date of Birth/Sex: Treating RN: 1950/11/23 (70 y.o. [name], [name] Primary Care Worthington Cruzan: [name] Other Clinician: Referring Tavita Eastham: Treating Aniyha Tate/Extender: Jeannette Corpus in Treatment: 269 Encounter Discharge Information Items Discharge Condition: Stable Ambulatory Status: Ambulatory Discharge Destination: Home Transportation: Private Auto Accompanied By: [employer] Schedule Follow-up Appointment: Yes Clinical Summary of Care: Patient Declined Electronic Signature(s) Signed: 03/21/2021 6:38:21  PM By: Rhae Hammock RN Entered By: Rhae Hammock on 03/21/2021 12:41:42 -------------------------------------------------------------------------------- Lower Extremity Assessment Details Patient Name: Date of Service: [name], Oneal J. 03/21/2021 10:30 A M Medical Record Number: [id number] Patient Account Number: [id number] Date of Birth/Sex: Treating RN: Jul 27, 1951 (70 y.o. [name] Primary Care Avishai Reihl: [name] Other Clinician: Referring Charon Akamine: Treating Joevanni Roddey/Extender: Jeannette Corpus in Treatment: 269 Edema Assessment Assessed: [name]: Yes] [name]: Yes] Edema: [Left: Yes] [Right: Yes] Calf Left: Right: Point of Measurement: 25 cm From Medial Instep 44 cm 39.5 cm Ankle Left: Right: Point of Measurement: 9 cm From Medial Instep 27 cm 27 cm Vascular  Assessment Pulses: Dorsalis Pedis Palpable: [Left:Yes] [Right:Yes] Electronic Signature(s) Signed: 03/22/2021 6:00:49 PM By: Deon Pilling Entered By: Deon Pilling on 03/21/2021 11:27:14 -------------------------------------------------------------------------------- Multi Wound Chart Details Patient Name: Date of Service: [facility], Gildo J. 03/21/2021 10:30 A M Medical Record Number: [id number] Patient Account Number: [id number] Date of Birth/Sex: Treating RN: 10-04-1951 (70 y.o. [name] Primary Care Tremel Setters: [name] Other Clinician: Referring Deandrae Wajda: Treating Octavian Godek/Extender: Jeannette Corpus in Treatment: 33 Vital Signs Height(in): 70 Capillary Blood Glucose(mg/dl): 166 Weight(lbs): 380.2 Pulse(bpm): 65 Body Mass Index(BMI): 88 Blood Pressure(mmHg): 147/64 Temperature(F): 98.1 Respiratory Rate(breaths/min): 20 Photos: [193:No Photos Left T Second oe] [195:No Photos Left, Anterior Lower Leg] [196:No Photos Right T Fourth oe] Wound Location: [193:Gradually Appeared] [195:Skin Tear/Laceration] [196:Gradually  Appeared] Wounding Event: [193:Diabetic Wound/Ulcer of the Lower] [195:Skin Tear] [196:Diabetic Wound/Ulcer of the Lower] Primary Etiology: [193:Extremity Chronic sinus problems/congestion,] [195:Chronic sinus problems/congestion, Chronic sinus problems/congestion,] [196:Extremity] Comorbid History: [193:Arrhythmia, Hypertension, Peripheral Arrhythmia, Hypertension, Peripheral Arrhythmia, Hypertension, Peripheral Arterial Disease, Type II Diabetes, Arterial Disease, Type II Diabetes, Arterial Disease, Type II Diabetes, History of  Burn, Gout, Confinement Anxiety 01/31/2021] [195:History of Burn, Gout, Confinement History of Burn, Gout, Confinement Anxiety 02/14/2021] [196:Anxiety 03/21/2021] Date Acquired: [193:7] [195:5] [196:0] Weeks of Treatment: [193:Open] [195:Open] [196:Open] Wound Status: [193:1x0.5x0.1] [195:1x1.7x0.1] [196:0.6x0.9x0.1] Measurements L x W x D (cm) [193:0.393] [195:1.335] [196:0.424] A (cm) : rea [193:0.039] [195:0.134] [196:0.042] Volume (cm) : [193:85.20%] [195:-126.70%] [196:N/A] % Reduction in Area: [193:85.30%] [195:-127.10%] [196:N/A] % Reduction in Volume: [193:Grade 1] [195:Full Thickness Without Exposed] [196:Grade 1] Classification: [193:Medium] [195:Support Structures Medium] [196:Medium] Exudate Amount: [193:Serosanguineous] [195:Serosanguineous] [196:Serous] Exudate Type: [193:red, brown] [195:red, brown] [196:amber] Exudate Color: [193:Distinct, outline attached] [195:Distinct, outline attached] [196:Distinct, outline attached] Wound Margin: [193:Large (67-100%)] [195:Large (67-100%)] [196:Large (67-100%)] Granulation Amount: [193:Red] [195:Red] [196:Red, Hyper-granulation] Granulation Quality: [193:None Present (0%)] [195:None Present (0%)] [196:None Present (0%)] Necrotic Amount: [193:Fat Layer (Subcutaneous Tissue): Yes Fat Layer (Subcutaneous Tissue): Yes Fat Layer (Subcutaneous Tissue): Yes] Exposed Structures: [193:Fascia: No Tendon: No Muscle: No  Joint: No Bone: No Small (1-33%)] [195:Fascia: No Tendon: No Muscle: No Joint: No Bone: No Small (1-33%)] [196:Fascia: No Tendon: No Muscle: No Joint: No Bone: No Small (1-33%)] Epithelialization: [193:N/A] [195:Compression Therapy] [196:N/A] Treatment Notes Electronic Signature(s) Signed: 03/21/2021 12:06:49 PM By: Kalman Shan DO Signed: 03/21/2021 6:10:17 PM By: Baruch Gouty RN, BSN Entered By: Kalman Shan on 03/21/2021 12:02:12 -------------------------------------------------------------------------------- Multi-Disciplinary Care Plan Details Patient Name: Date of Service: [facility], Fenris J. 03/21/2021 10:30 A M Medical Record Number: [id number] Patient Account Number: [id number] Date of Birth/Sex: Treating RN: [birth year] (70 y.o. [name] Primary Care Hadia Minier: [name] Other Clinician: Referring Draper Gallon: Treating Lerae Langham/Extender: Jeannette Corpus in Treatment: Helix reviewed with physician Active Inactive Venous Leg Ulcer Nursing Diagnoses: Actual venous Insuffiency (use after diagnosis is confirmed) Goals: Patient will maintain optimal edema control Date Initiated: 09/10/2016 Target Resolution Date: 04/04/2021 Goal Status: Active Verify adequate tissue perfusion prior to therapeutic compression application Date Initiated: 09/10/2016 Date Inactivated: 11/28/2016 Goal Status: Met Interventions: Assess peripheral edema status every visit. Compression as ordered Provide education on venous insufficiency Notes: Electronic Signature(s) Signed: 03/21/2021 6:10:17 PM By: Baruch Gouty RN, BSN Entered By: Baruch Gouty on 03/21/2021 11:49:29 -------------------------------------------------------------------------------- Pain Assessment Details Patient Name: Date of Service: [name], Bing J. 03/21/2021 10:30 A M Medical Record Number: [id number] Patient Account Number: [id number] Date of  Birth/Sex: Treating RN: 1951/08/12 (70 y.o. [name] Primary Care Granvil Djordjevic: [name] Other Clinician: Referring Lilliana Turner: Treating  Develle Sievers/Extender: Jeannette Corpus in Treatment: 269 Active Problems Location of Pain Severity and Description of Pain Patient Has Paino No Site Locations Rate the pain. Current Pain Level: 0 Pain Management and Medication Current Pain Management: Medication: No Cold Application: No Rest: No Massage: No Activity: No T.E.N.S.: No Heat Application: No Leg drop or elevation: No Is the Current Pain Management Adequate: Adequate How does your wound impact your activities of daily livingo Sleep: No Bathing: No Appetite: No Relationship With Others: No Bladder Continence: No Emotions: No Bowel Continence: No Work: No Toileting: No Drive: No Dressing: No Hobbies: No Electronic Signature(s) Signed: 03/22/2021 6:00:49 PM By: Deon Pilling Entered By: Deon Pilling on 03/21/2021 11:26:48 -------------------------------------------------------------------------------- Patient/Caregiver Education Details Patient Name: Date of Service: [name], [name] 5/18/2022andnbsp10:30 A M Medical Record Number: [id number] Patient Account Number: [id number] Date of Birth/Gender: Treating RN: June 15, 1951 (70 y.o. [name] Primary Care Physician: [name] Other Clinician: Referring Physician: Treating Physician/Extender: Jeannette Corpus in Treatment: 19 Education Assessment Education Provided To: Patient Education Topics Provided Venous: Methods: Explain/Verbal Responses: Reinforcements needed, State content correctly Electronic Signature(s) Signed: 03/21/2021 6:10:17 PM By: Baruch Gouty RN, BSN Entered By: Baruch Gouty on 03/21/2021 11:49:53 -------------------------------------------------------------------------------- Wound Assessment Details Patient Name: Date of  Service: [name], Izaiah J. 03/21/2021 10:30 A M Medical Record Number: [id number] Patient Account Number: [id number] Date of Birth/Sex: Treating RN: 1951/07/29 (70 y.o. [name] Primary Care Keyaan Lederman: [name] Other Clinician: Referring Val Schiavo: Treating Lealer Marsland/Extender: Jeannette Corpus in Treatment: 269 Wound Status Wound Number: [id number] Primary Diabetic Wound/Ulcer of the Lower Extremity Etiology: Wound Location: Left T Second oe Wound Open Wounding Event: Gradually Appeared Status: Date Acquired: 01/31/2021 Comorbid Chronic sinus problems/congestion, Arrhythmia, Hypertension, Weeks Of Treatment: 7 History: Peripheral Arterial Disease, Type II Diabetes, History of Burn, Gout, Clustered Wound: No Confinement Anxiety Photos Wound Measurements Length: (cm) 1 Width: (cm) 0.5 Depth: (cm) 0.1 Area: (cm) 0.393 Volume: (cm) 0.039 % Reduction in Area: 85.2% % Reduction in Volume: 85.3% Epithelialization: Small (1-33%) Tunneling: No Undermining: No Wound Description Classification: Grade 1 Wound Margin: Distinct, outline attached Exudate Amount: Medium Exudate Type: Serosanguineous Exudate Color: red, brown Foul Odor After Cleansing: No Slough/Fibrino No Wound Bed Granulation Amount: Large (67-100%) Exposed Structure Granulation Quality: Red Fascia Exposed: No Necrotic Amount: None Present (0%) Fat Layer (Subcutaneous Tissue) Exposed: Yes Tendon Exposed: No Muscle Exposed: No Joint Exposed: No Bone Exposed: No Treatment Notes Wound #193 (Toe Second) Wound Laterality: Left Cleanser Peri-Wound Care Topical Primary Dressing PolyMem Silver Non-Adhesive Dressing, 4.25x4.25 in Discharge Instruction: Apply to wound bed as instructed Secondary Dressing Woven Gauze Sponges 2x2 in Discharge Instruction: Apply over primary dressing as directed. Secured With Conforming Stretch Gauze Bandage, Sterile 2x75 (in/in) Discharge Instruction:  Secure with stretch gauze as directed. Compression Wrap Compression Stockings Add-Ons Electronic Signature(s) Signed: 03/22/2021 8:07:53 AM By: Sandre Kitty Signed: 03/22/2021 6:00:49 PM By: Deon Pilling Entered By: Sandre Kitty on 03/21/2021 16:48:26 -------------------------------------------------------------------------------- Wound Assessment Details Patient Name: Date of Service: [name], Jaquon J. 03/21/2021 10:30 A M Medical Record Number: [id number] Patient Account Number: [id number] Date of Birth/Sex: Treating RN: [birth year] (70 y.o. [name] Primary Care Quenna Doepke: [name] Other Clinician: Referring Velda Wendt: Treating Nephtali Docken/Extender: Jeannette Corpus in Treatment: 269 Wound Status Wound Number: [id number] Primary Skin T ear Etiology: Wound Location: Left, Anterior Lower Leg Wound Open Wounding Event: Skin Tear/Laceration Status: Date Acquired: 02/14/2021 Comorbid Chronic sinus problems/congestion, Arrhythmia, Hypertension, Weeks Of  Treatment: 5 History: Peripheral Arterial Disease, Type II Diabetes, History of Burn, Gout, Clustered Wound: No Confinement Anxiety Photos Wound Measurements Length: (cm) 1 Width: (cm) 1.7 Depth: (cm) 0.1 Area: (cm) 1.335 Volume: (cm) 0.134 % Reduction in Area: -126.7% % Reduction in Volume: -127.1% Epithelialization: Small (1-33%) Tunneling: No Undermining: No Wound Description Classification: Full Thickness Without Exposed Support Structures Wound Margin: Distinct, outline attached Exudate Amount: Medium Exudate Type: Serosanguineous Exudate Color: red, brown Foul Odor After Cleansing: No Slough/Fibrino No Wound Bed Granulation Amount: Large (67-100%) Exposed Structure Granulation Quality: Red Fascia Exposed: No Necrotic Amount: None Present (0%) Fat Layer (Subcutaneous Tissue) Exposed: Yes Tendon Exposed: No Muscle Exposed: No Joint Exposed: No Bone Exposed: No Treatment  Notes Wound #195 (Lower Leg) Wound Laterality: Left, Anterior Cleanser Peri-Wound Care Sween Lotion (Moisturizing lotion) Discharge Instruction: Apply moisturizing lotion as directed Topical Primary Dressing PolyMem Silver Non-Adhesive Dressing, 4.25x4.25 in Discharge Instruction: Apply to wound bed as instructed Secondary Dressing ABD Pad, 5x9 Discharge Instruction: Apply over primary dressing as directed. Secured With Compression Wrap FourPress (4 layer compression wrap) Discharge Instruction: Apply four layer compression as directed. May also use Miliken CoFlex 2 layer compression system as alternative. Compression Stockings Add-Ons Electronic Signature(s) Signed: 03/22/2021 8:07:53 AM By: Sandre Kitty Signed: 03/22/2021 6:00:49 PM By: Deon Pilling Entered By: Sandre Kitty on 03/21/2021 16:47:04 -------------------------------------------------------------------------------- Wound Assessment Details Patient Name: Date of Service: [name], Ladavion J. 03/21/2021 10:30 A M Medical Record Number: [id number] Patient Account Number: [id number] Date of Birth/Sex: Treating RN: 1951/04/24 (70 y.o. [name] Primary Care Scottie Stanish: [name] Other Clinician: Referring Bengie Kaucher: Treating Buckley Bradly/Extender: Jeannette Corpus in Treatment: 269 Wound Status Wound Number: [id number] Primary Diabetic Wound/Ulcer of the Lower Extremity Etiology: Wound Location: Right T Fourth oe Wound Open Wounding Event: Gradually Appeared Status: Date Acquired: 03/21/2021 Comorbid Chronic sinus problems/congestion, Arrhythmia, Hypertension, Weeks Of Treatment: 0 History: Peripheral Arterial Disease, Type II Diabetes, History of Burn, Gout, Clustered Wound: No Confinement Anxiety Photos Wound Measurements Length: (cm) 0.6 Width: (cm) 0.9 Depth: (cm) 0.1 Area: (cm) 0.424 Volume: (cm) 0.042 % Reduction in Area: 0% % Reduction in Volume: 0% Epithelialization:  Small (1-33%) Tunneling: No Undermining: No Wound Description Classification: Grade 1 Wound Margin: Distinct, outline attached Exudate Amount: Medium Exudate Type: Serous Exudate Color: amber Foul Odor After Cleansing: No Slough/Fibrino No Wound Bed Granulation Amount: Large (67-100%) Exposed Structure Granulation Quality: Red, Hyper-granulation Fascia Exposed: No Necrotic Amount: None Present (0%) Fat Layer (Subcutaneous Tissue) Exposed: Yes Tendon Exposed: No Muscle Exposed: No Joint Exposed: No Bone Exposed: No Treatment Notes Wound #196 (Toe Fourth) Wound Laterality: Right Cleanser Peri-Wound Care Topical Primary Dressing PolyMem Silver Non-Adhesive Dressing, 4.25x4.25 in Discharge Instruction: Apply to wound bed as instructed Secondary Dressing Woven Gauze Sponges 2x2 in Discharge Instruction: Apply over primary dressing as directed. Secured With Conforming Stretch Gauze Bandage, Sterile 2x75 (in/in) Discharge Instruction: Secure with stretch gauze as directed. Compression Wrap Compression Stockings Add-Ons Electronic Signature(s) Signed: 03/22/2021 8:07:53 AM By: Sandre Kitty Signed: 03/22/2021 6:00:49 PM By: Deon Pilling Entered By: Sandre Kitty on 03/21/2021 16:46:22 -------------------------------------------------------------------------------- Vitals Details Patient Name: Date of Service: [name], Junious J. 03/21/2021 10:30 A M Medical Record Number: [id number] Patient Account Number: [id number] Date of Birth/Sex: Treating RN: February 02, 1951 (70 y.o. [name] Primary Care Jeydi Klingel: [name] Other Clinician: Referring Shanaiya Bene: Treating Raynor Calcaterra/Extender: Jeannette Corpus in Treatment: 269 Vital Signs Time Taken: 11:12 Temperature (F): 98.1 Height (in): 70 Pulse (bpm): 65 Weight (lbs): 380.2  Respiratory Rate (breaths/min): 20 Body Mass Index (BMI): 54.5 Blood Pressure (mmHg): 147/64 Capillary Blood  Glucose (mg/dl): 166 Reference Range: 80 - 120 mg / dl Electronic Signature(s) Signed: 03/22/2021 6:00:49 PM By: Deon Pilling Entered By: Deon Pilling on 03/21/2021 11:26:35

## 2021-03-28 ENCOUNTER — Encounter (HOSPITAL_BASED_OUTPATIENT_CLINIC_OR_DEPARTMENT_OTHER): Payer: Medicare Other | Admitting: Internal Medicine

## 2021-03-28 ENCOUNTER — Other Ambulatory Visit: Payer: Self-pay

## 2021-03-28 DIAGNOSIS — I890 Lymphedema, not elsewhere classified: Secondary | ICD-10-CM | POA: Diagnosis not present

## 2021-03-28 DIAGNOSIS — L97822 Non-pressure chronic ulcer of other part of left lower leg with fat layer exposed: Secondary | ICD-10-CM | POA: Diagnosis not present

## 2021-03-28 DIAGNOSIS — E1140 Type 2 diabetes mellitus with diabetic neuropathy, unspecified: Secondary | ICD-10-CM | POA: Diagnosis not present

## 2021-03-28 DIAGNOSIS — E11621 Type 2 diabetes mellitus with foot ulcer: Secondary | ICD-10-CM | POA: Diagnosis not present

## 2021-03-28 DIAGNOSIS — I87333 Chronic venous hypertension (idiopathic) with ulcer and inflammation of bilateral lower extremity: Secondary | ICD-10-CM | POA: Diagnosis not present

## 2021-03-28 DIAGNOSIS — L97522 Non-pressure chronic ulcer of other part of left foot with fat layer exposed: Secondary | ICD-10-CM | POA: Diagnosis not present

## 2021-03-28 DIAGNOSIS — E11622 Type 2 diabetes mellitus with other skin ulcer: Secondary | ICD-10-CM | POA: Diagnosis not present

## 2021-03-28 NOTE — Progress Notes (Signed)
DAYMEN, HASSEBROCK ([id number]) Visit Report for 03/28/2021 Arrival Information Details Patient Name: Date of Service: CO CHRISOPHER, PUSTEJOVSKY 03/28/2021 10:30 A M Medical Record Number: [id number] Patient Account Number: [id number] Date of Birth/Sex: Treating RN: Mar 22, 1951 (70 y.o. [name] Primary Care Aaira Oestreicher: Nicoletta Ba Other Clinician: Referring Tikita Mabee: Treating Dany Walther/Extender: Debe Coder in Treatment: 270 Visit Information History Since Last Visit Added or deleted any medications: No Patient Arrived: Walker Any new allergies or adverse reactions: No Arrival Time: 10:45 Had a fall or experienced change in No Accompanied By: self activities of daily living that may affect Transfer Assistance: None risk of falls: Patient Identification Verified: Yes Signs or symptoms of abuse/neglect since last visito No Secondary Verification Process Completed: Yes Hospitalized since last visit: No Patient Requires Transmission-Based Precautions: No Implantable device outside of the clinic excluding No Patient Has Alerts: Yes cellular tissue based products placed in the center since last visit: Has Dressing in Place as Prescribed: Yes Has Compression in Place as Prescribed: Yes Pain Present Now: No Electronic Signature(s) Signed: 03/28/2021 4:06:25 PM By: Shawn Stall Entered By: Shawn Stall on 03/28/2021 12:01:26 -------------------------------------------------------------------------------- Compression Therapy Details Patient Name: Date of Service: [name], Haidan J. 03/28/2021 10:30 A M Medical Record Number: [id number] Patient Account Number: [id number] Date of Birth/Sex: Treating RN: 07-05-1951 (69 y.o. [name] Primary Care Faiga Stones: Nicoletta Ba Other Clinician: Referring Akeila Lana: Treating Wasil Wolke/Extender: Debe Coder in Treatment: 270 Compression Therapy Performed for Wound Assessment: Wound #195  Left,Anterior Lower Leg Performed By: Clinician Shawn Stall, RN Compression Type: Four Layer Electronic Signature(s) Signed: 03/28/2021 4:06:25 PM By: Shawn Stall Entered By: Shawn Stall on 03/28/2021 12:03:34 -------------------------------------------------------------------------------- Compression Therapy Details Patient Name: Date of Service: [name], Iven J. 03/28/2021 10:30 A M Medical Record Number: [id number] Patient Account Number: [id number] Date of Birth/Sex: Treating RN: Apr 19, 1951 (70 y.o. [name] Primary Care Adonys Wildes: Nicoletta Ba Other Clinician: Referring Roan Sawchuk: Treating Bayley Hurn/Extender: Debe Coder in Treatment: 270 Compression Therapy Performed for Wound Assessment: NonWound Condition Lymphedema - Right Leg Performed By: Clinician Shawn Stall, RN Compression Type: Four Layer Electronic Signature(s) Signed: 03/28/2021 4:06:25 PM By: Shawn Stall Entered By: Shawn Stall on 03/28/2021 12:04:16 -------------------------------------------------------------------------------- Encounter Discharge Information Details Patient Name: Date of Service: [facility], Kemoni J. 03/28/2021 10:30 A M Medical Record Number: [id number] Patient Account Number: [id number] Date of Birth/Sex: Treating RN: 1951/01/28 (70 y.o. [name] Primary Care Selda Jalbert: Nicoletta Ba Other Clinician: Referring Randale Carvalho: Treating Melea Prezioso/Extender: Debe Coder in Treatment: [phone] Encounter Discharge Information Items Discharge Condition: Stable Ambulatory Status: Walker Discharge Destination: Home Transportation: Private Auto Accompanied By: self Schedule Follow-up Appointment: Yes Clinical Summary of Care: Electronic Signature(s) Signed: 03/28/2021 4:06:25 PM By: Shawn Stall Entered By: Shawn Stall on 03/28/2021  12:05:39 -------------------------------------------------------------------------------- Patient/Caregiver Education Details Patient Name: Date of Service: [name] 5/25/2022andnbsp10:30 A M Medical Record Number: [id number] Patient Account Number: [id number] Date of Birth/Gender: Treating RN: January 15, 1951 (70 y.o. [name] Primary Care Physician: Nicoletta Ba Other Clinician: Referring Physician: Treating Physician/Extender: Debe Coder in Treatment: 270 Education Assessment Education Provided To: Patient Education Topics Provided Venous: Handouts: Managing Venous Disease and Related Ulcers Methods: Explain/Verbal Responses: Reinforcements needed Electronic Signature(s) Signed: 03/28/2021 4:06:25 PM By: Shawn Stall Entered By: Shawn Stall on 03/28/2021 12:05:24 -------------------------------------------------------------------------------- Wound Assessment Details Patient Name: Date of Service: [name], Tomothy J. 03/28/2021 10:30 A M Medical Record Number: [id number] Patient Account Number: [id number] Date  of Birth/Sex: Treating RN: 1951-09-07 (70 y.o. [name] Primary Care Rhaya Coale: Nicoletta Ba Other Clinician: Referring Cherlyn Syring: Treating Amylia Collazos/Extender: Debe Coder in Treatment: 270 Wound Status Wound Number: 193 Primary Etiology: Diabetic Wound/Ulcer of the Lower Extremity Wound Location: Left T Second oe Wound Status: Open Wounding Event: Gradually Appeared Date Acquired: 01/31/2021 Weeks Of Treatment: 8 Clustered Wound: No Wound Measurements Length: (cm) 1 Width: (cm) 0.5 Depth: (cm) 1 Area: (cm) 0.393 Volume: (cm) 0.393 % Reduction in Area: 85.2% % Reduction in Volume: -48.3% Wound Description Classification: Grade 1 Treatment Notes Wound #193 (Toe Second) Wound Laterality: Left Cleanser Peri-Wound Care Topical Primary Dressing PolyMem Silver Non-Adhesive  Dressing, 4.25x4.25 in Discharge Instruction: Apply to wound bed as instructed Secondary Dressing Woven Gauze Sponges 2x2 in Discharge Instruction: Apply over primary dressing as directed. Secured With Conforming Stretch Gauze Bandage, Sterile 2x75 (in/in) Discharge Instruction: Secure with stretch gauze as directed. Compression Wrap Compression Stockings Add-Ons Electronic Signature(s) Signed: 03/28/2021 4:06:25 PM By: Shawn Stall Entered By: Shawn Stall on 03/28/2021 12:02:59 -------------------------------------------------------------------------------- Wound Assessment Details Patient Name: Date of Service: CO TEO, MOEDE 03/28/2021 10:30 A M Medical Record Number: [id number] Patient Account Number: [id number] Date of Birth/Sex: Treating RN: 15-Aug-1951 (70 y.o. [name] Primary Care Andersson Larrabee: Nicoletta Ba Other Clinician: Referring Tyashia Morrisette: Treating Samreet Edenfield/Extender: Debe Coder in Treatment: 270 Wound Status Wound Number: 195 Primary Etiology: Skin Tear Wound Location: Left, Anterior Lower Leg Wound Status: Open Wounding Event: Skin Tear/Laceration Date Acquired: 02/14/2021 Weeks Of Treatment: 6 Clustered Wound: No Wound Measurements Length: (cm) 1 Width: (cm) 1.7 Depth: (cm) 0.1 Area: (cm) 1.335 Volume: (cm) 0.134 % Reduction in Area: -126.7% % Reduction in Volume: -127.1% Wound Description Classification: Full Thickness Without Exposed Support Structur es Treatment Notes Wound #195 (Lower Leg) Wound Laterality: Left, Anterior Cleanser Peri-Wound Care Sween Lotion (Moisturizing lotion) Discharge Instruction: Apply moisturizing lotion as directed Topical Primary Dressing PolyMem Silver Non-Adhesive Dressing, 4.25x4.25 in Discharge Instruction: Apply to wound bed as instructed Secondary Dressing ABD Pad, 5x9 Discharge Instruction: Apply over primary dressing as directed. Secured With Compression  Wrap FourPress (4 layer compression wrap) Discharge Instruction: Apply four layer compression as directed. May also use Miliken CoFlex 2 layer compression system as alternative. Compression Stockings Add-Ons Electronic Signature(s) Signed: 03/28/2021 4:06:25 PM By: Shawn Stall Entered By: Shawn Stall on 03/28/2021 12:02:59 -------------------------------------------------------------------------------- Wound Assessment Details Patient Name: Date of Service: CO WHEELER, INCORVAIA 03/28/2021 10:30 A M Medical Record Number: [id number] Patient Account Number: [id number] Date of Birth/Sex: Treating RN: 1951/02/02 (70 y.o. [name] Primary Care Loretta Kluender: Nicoletta Ba Other Clinician: Referring Zillah Alexie: Treating Dinesha Twiggs/Extender: Debe Coder in Treatment: 270 Wound Status Wound Number: 196 Primary Etiology: Diabetic Wound/Ulcer of the Lower Extremity Wound Location: Right T Fourth oe Wound Status: Open Wounding Event: Gradually Appeared Date Acquired: 03/21/2021 Weeks Of Treatment: 1 Clustered Wound: No Wound Measurements Length: (cm) 0.6 Width: (cm) 0.9 Depth: (cm) 0.1 Area: (cm) 0.424 Volume: (cm) 0.042 % Reduction in Area: 0% % Reduction in Volume: 0% Wound Description Classification: Grade 1 Treatment Notes Wound #196 (Toe Fourth) Wound Laterality: Right Cleanser Peri-Wound Care Topical Primary Dressing PolyMem Silver Non-Adhesive Dressing, 4.25x4.25 in Discharge Instruction: Apply to wound bed as instructed Secondary Dressing Woven Gauze Sponges 2x2 in Discharge Instruction: Apply over primary dressing as directed. Secured With Conforming Stretch Gauze Bandage, Sterile 2x75 (in/in) Discharge Instruction: Secure with stretch gauze as directed. Compression Wrap Compression Stockings Add-Ons Electronic Signature(s) Signed: 03/28/2021 4:06:25  PM By: Shawn Stall Entered By: Shawn Stall on 03/28/2021  12:02:59 -------------------------------------------------------------------------------- Vitals Details Patient Name: Date of Service: [name], Bladyn J. 03/28/2021 10:30 A M Medical Record Number: [id number] Patient Account Number: [id number] Date of Birth/Sex: Treating RN: 08-Sep-1951 (70 y.o. [name] Primary Care Ambria Mayfield: Nicoletta Ba Other Clinician: Referring Lukis Bunt: Treating Karlee Staff/Extender: Debe Coder in Treatment: 270 Vital Signs Time Taken: 10:45 Temperature (F): 98.1 Height (in): 70 Pulse (bpm): 73 Weight (lbs): 380.2 Respiratory Rate (breaths/min): 16 Body Mass Index (BMI): 54.5 Blood Pressure (mmHg): 149/80 Capillary Blood Glucose (mg/dl): 144 Reference Range: 80 - 120 mg / dl Electronic Signature(s) Signed: 03/28/2021 4:06:25 PM By: Shawn Stall Entered By: Shawn Stall on 03/28/2021 12:02:09

## 2021-03-29 ENCOUNTER — Ambulatory Visit: Payer: Medicare Other | Admitting: Family

## 2021-04-03 NOTE — Progress Notes (Signed)
Office Note 04/04/2021  CC:  Chief Complaint  Patient presents with  . Annual Exam    Pt is fasting    HPI:  [name] is a 70 y.o. White male who is here for annual health maintenance exam and 3 mo f/u DM, HTN, CRI III, and chronic a-fib. A/P as of last visit: "1) DM, historically his control has been pretty good. Hba1c and lytes/cr today. He'll try to save his urine next visit so we can check microalb/cr. Cont humalog mix 75/25 per sliding scale bid and cont metformin but watching GFR. Pt says wt loss MD wants to add ozempic back on as long as GFR ok---he'll be seeing them again in April.  2) HTN: good control. Cont toprol xl  bid, 1/2 lisin  tab qd, dilt CD 240 qd. Lytes/cr today.  3) CRI III: avoids NSAIDs, tries to hydrate adequately. Is on lasix for LE edema and HF. Lytes/cr today.  4) A-fib: cont rate control with dilt and toprol xl and stroke prophylaxis with xarelto.  5) HA syndrome: we'll controlled/fioricet in very small amounts effective. RF'd #60 today.  6) Prev healthcare: Next PSA due 07/2021. Colon ca scr: virtual colonoscopy NEG 2012. Plan iFOB or cologuard 2022."  INTERIM HX: Says he's doing pretty good.  Frustrated b/c he's got to find new apartment b/c current one was bought by another company. Says LE edema unchanged.  DM:  Usually 140-150s, rare inc to 200s. 07/2020 LDL was 61, pt not on statin.  HTN: home monitoring ->153/76 yesterday.  Usually better controlled.  He feels like the stress of what's happening with his impending move to new apartment is affecting this.  CRI III: avoids NSAIDs. Intake of fluids is <40 oz per day.  Chronic a-fib:  No palpitations or heart racing.  Most recent cardiology f/u was 02/2020->no changes were made to his lasix (  qAM and 40 mg qPM).  Regarding OSA: pt was eval'd by [facility] pulm-sleep med in 2020, last note of theirs in EMR was in April 2020 stating that they wanted him to get a  2nd sleep study and had LMOM for him to return call and schedule this.  Pt states he did not pursue this b/c he could not sleep at all with all the wires on him, bed not comfortable.   He denies excessive daytime somnolence.   Past Medical History:  Diagnosis Date  . ALLERGIC RHINITIS 08/11/2006  . ASTHMA 08/11/2006  . Chronic atrial fibrillation (HCC) 08/2014  . Chronic combined systolic and diastolic CHF (congestive heart failure) [facility])    Cardiology f/u 12/2017: pt volume overloaded (R heart dysf suspected), BNP very high, lasix increased.  Repeat echo 12/2017: normal LV EF, mild DD, +RV syst dysfxn, mod pulm HTN, biatrial enlgmt.  . Chronic constipation   . Chronic renal insufficiency, stage 2 (mild)    Borderline stage III (GFR 60s).  . Colon cancer screening 02/2021   02/2021 Cologuard POS->GI ref  . DIABETES MELLITUS, TYPE II 08/11/2006  . DM W/RENAL MANIFESTATIONS, TYPE II 04/20/2007  . HYPERTENSION 08/11/2006  . Impaired mobility and endurance   . MRSA infection 06/2018   LL venous stasis ulcer infected  . Normocytic anemia 2016-2019   03/2018 B12 normal, iron ok (ferritin borderline low).  Plan repeat CBC at f/u summer 2019 and if decreased from baseline will check hemoccults and iron labs again.  . OBESITY, MORBID 12/14/2007   saxenda started 05/2020 by [facility] wt mgmt center  . OSA (  obstructive sleep apnea)    to get sleep study with [facility]) as of 12/03/2018 consult.  . 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  [id number] ID started him on amoxil prophylaxis---ineffective.  End 2018/Jan 2019 penicillin G injections prophyl helpful but pt declined to continue this as of 01/2018 ID f/u.  ID talked him into resuming monthly penicillin G as of 02/2018 f/u.    Marland Kitchen Restless leg syndrome    Rx'd clonazepam 09/2017 and pt refused to take it after reading the medication's potential side effects.  . Venous stasis  ulcers of both lower extremities (HCC)    Severe lymphedema.  wound clinic care ongoing as of 01/2018    Past Surgical History:  Procedure Laterality Date  . Carotid dopplers  07/23/2018   Left NORMAL.  Right 1-39% ICA stenosis, with <50% distal CCA stenosis (not hemodynamically significant)  . TRANSTHORACIC ECHOCARDIOGRAM  11/2007; 09/2014; 11/2015;12/2017   LV fxn normal, EF normal, mild dilation of left atrium.  2015 grade II diast dysfxn.  2017 EF 55-60%. 2019: LVEF 60-65%, mild RV syst dysf,biatrial enlgmt, mod pulm htn.  Marland Kitchen URETERAL STENT PLACEMENT    . virtual colonoscopy  01/2011   Normal    Family History  Problem Relation Age of Onset  . Diabetes Mother   . Diabetes Sister   . Hydrocephalus Sister        NPH    Social History   Socioeconomic History  . Marital status: Single    Spouse name: Not on file  . Number of children: Not on file  . Years of education: Not on file  . Highest education level: Not on file  Occupational History  . Occupation: retired  Tobacco Use  . Smoking status: Never Smoker  . Smokeless tobacco: Never Used  Vaping Use  . Vaping Use: Never used  Substance and Sexual Activity  . Alcohol use: No  . Drug use: No  . Sexual activity: Not on file  Other Topics Concern  . Not on file  Social History Narrative   Single, no children.   Lives in [place] since 1985 [place] from [place]).   Occupation: Science writer for [employer].   No Tobacco, no alc, drugs.   Exercise: no    Diet: no      Social Determinants of Health   Financial Resource Strain: Medium Risk  . Difficulty of Paying Living Expenses: Somewhat hard  Food Insecurity: Food Insecurity Present  . Worried About Programme researcher, broadcasting/film/video in the Last Year: Sometimes true  . Ran Out of Food in the Last Year: Sometimes true  Transportation Needs: No Transportation Needs  . Lack of Transportation (Medical): No  . Lack of Transportation (Non-Medical): No  Physical Activity:  Inactive  . Days of Exercise per Week: 0 days  . Minutes of Exercise per Session: 0 min  Stress: Stress Concern Present  . Feeling of Stress : To some extent  Social Connections: Moderately Isolated  . Frequency of Communication with Friends and Family: More than three times a week  . Frequency of Social Gatherings with Friends and Family: Once a week  . Attends Religious Services: 1 to 4 times per year  . Active Member of Clubs or Organizations: No  . Attends Banker Meetings: Never  . Marital Status: Never married  Intimate Partner Violence: Not At Risk  . Fear of Current or Ex-Partner: No  . Emotionally Abused:  No  . Physically Abused: No  . Sexually Abused: No    Outpatient Medications Prior to Visit  Medication Sig Dispense Refill  . arginine 500 MG tablet Take by mouth.    Marland Kitchen. b complex vitamins capsule Take 1 capsule by mouth every morning.    . butalbital-acetaminophen-caffeine (FIORICET WITH CODEINE) 50-325-40-30 MG capsule TAKE 1 TO 2 CAPSULES BY  MOUTH EVERY 6 HOURS AS  NEEDED FOR HEADACHE(S)  MANUFACTURER RECOMMENDS NOT EXCEEDING 6 CAPSULES/DAY 60 capsule 0  . cetirizine (ZYRTEC) 10 MG tablet Take 10 mg by mouth daily.    . CHROMIUM GTF PO Take by mouth daily.    . Coenzyme Q10 (CO Q 10) 10 MG CAPS Take by mouth. Reported on 02/19/2016    . diltiazem (TIAZAC) 240 MG 24 hr capsule TAKE 1 CAPSULE BY MOUTH  DAILY 90 capsule 1  . furosemide (LASIX) 40 MG tablet TAKE 2 TABLETS BY MOUTH TWO TIMES DAILY (Patient taking differently: Take 80 mg in the mornings and 40 mg in the evenings) 360 tablet 3  . Glutamine 500 MG CAPS Take by mouth.    Marland Kitchen. HYDROcodone-acetaminophen (NORCO/VICODIN) 5-325 MG tablet 1-2 tabs po bid prn pain 60 tablet 0  . Insulin Lispro Prot & Lispro (HUMALOG MIX 75/25 KWIKPEN) (75-25) 100 UNIT/ML Kwikpen INJECT SUBCUTANEOUSLY 25  UNITS EVERY MORNING AND 20  UNITS EVERY EVENING AT  SUPPER 45 mL 3  . Insulin Pen Needle (B-D ULTRAFINE III SHORT PEN) 31G X  8 MM MISC USE 1 PENNEEDLE TWICE DAILY 200 each 1  . lisinopril (ZESTRIL) 40 MG tablet Take 1/2 tablet(20mg ) by mouth daily. 90 tablet 1  . metFORMIN (GLUCOPHAGE) 1000 MG tablet TAKE 1 TABLET BY MOUTH  TWICE DAILY WITH MEALS 180 tablet 1  . metoprolol tartrate (LOPRESSOR) 50 MG tablet TAKE 1 AND 1/2 TABLETS BY  MOUTH TWICE DAILY 270 tablet 3  . Multiple Vitamin (MULTIVITAMIN) tablet Take 1 tablet by mouth daily.    [name]. ONETOUCH ULTRA test strip CHECK BLOOD SUGAR TWICE  DAILY 200 strip 3  . rivaroxaban (XARELTO) 20 MG TABS tablet TAKE 1 TABLET BY MOUTH ONCE DAILY WITH SUPPER 90 tablet 3  . terazosin (HYTRIN) 10 MG capsule TAKE 1 CAPSULE BY MOUTH  ONCE DAILY AT BEDTIME 90 capsule 3  . vitamin E 400 UNIT capsule Take 400 Units by mouth every morning. Selenium 50mg     . Insulin Pen Needle (BD ULTRA-FINE PEN NEEDLES) 29G X 12.7MM MISC USE TWICE DAILY 200 each 3   Facility-Administered Medications Prior to Visit  Medication Dose Route Frequency Provider Last Rate Last Admin  . penicillin g benzathine (BICILLIN LA) 1200000 UNIT/2ML injection 600,000 Units  600,000 Units Intramuscular Q30 days Jeoffrey MassedMcGowen, Lanny Donoso H, MD   600,000 Units at 04/12/20 0933  . penicillin g benzathine (BICILLIN LA) 1200000 UNIT/2ML injection 600,000 Units  600,000 Units Intramuscular Q30 days Jeoffrey MassedMcGowen, Jamaiyah Pyle H, MD   600,000 Units at 04/04/21 [phone]  . penicillin g benzathine (BICILLIN LA) 1200000 UNIT/2ML injection 600,000 Units  600,000 Units Intramuscular Q30 days Jeoffrey MassedMcGowen, Jimy Gates H, MD   600,000 Units at 04/04/21 0813  . penicillin g benzathine (BICILLIN LA) 1200000 UNIT/2ML injection 600,000 Units  600,000 Units Intramuscular Q30 days Jeoffrey MassedMcGowen, Kamile Fassler H, MD   600,000 Units at 02/28/21 0857    Allergies  Allergen Reactions  . Other Other (See Comments)    Sneezing, coughing    ROS Review of Systems  Constitutional: Negative for appetite change, chills, fatigue and fever.  HENT: Negative  for congestion, dental problem, ear pain  and sore throat.   Eyes: Negative for discharge, redness and visual disturbance.  Respiratory: Negative for cough, chest tightness, shortness of breath and wheezing.   Cardiovascular: Positive for leg swelling (chronic bilat LL lymphedema--unchanged). Negative for chest pain and palpitations.  Gastrointestinal: Negative for abdominal pain, blood in stool, diarrhea, nausea and vomiting.  Genitourinary: Negative for difficulty urinating, dysuria, flank pain, frequency, hematuria and urgency.  Musculoskeletal: Negative for arthralgias, back pain, joint swelling, myalgias and neck stiffness.  Skin: Negative for pallor and rash.  Neurological: Negative for dizziness, speech difficulty, weakness and headaches.  Hematological: Negative for adenopathy. Does not bruise/bleed easily.  Psychiatric/Behavioral: Negative for confusion and sleep disturbance. The patient is not nervous/anxious.    PE; Vitals with BMI 04/04/2021 02/07/2021 01/03/2021  Height 5' 8.5" 5\' 9"  5\' 9"   Weight 383 lbs 3 oz 393 lbs 385 lbs 10 oz  BMI 57.41 58.01 56.92  Systolic 153 - 146  Diastolic 65 - 68  Pulse 63 - 61     Gen: Alert, well appearing.  Morbidly obese, pt unable to get up on exam table.  Patient is oriented to person, place, time, and situation. AFFECT: pleasant, lucid thought and speech. ENT: Ears: EACs clear, normal epithelium.  TMs with good light reflex and landmarks bilaterally.  Eyes: no injection, icteris, swelling, or exudate.  EOMI, PERRLA. Nose: no drainage or turbinate edema/swelling.  No injection or focal lesion.  Mouth: lips without lesion/swelling.  Oral mucosa pink and moist.  Dentition intact and without obvious caries or gingival swelling.  Oropharynx without erythema, exudate, or swelling.  Neck: supple/nontender.  No LAD, mass, or TM.  Carotid pulses 2+ bilaterally, without bruits. CV: RRR alt with brief periods of irregularity, rate 65-70, no m/r/g.   LUNGS: CTA bilat, nonlabored resps, good  aeration in all lung fields. ABD: soft, NT, ND, BS normal.  No hepatospenomegaly or mass.  No bruits. EXT: no clubbing or cyanosis.  He has compression wraps on both LL's. Musculoskeletal: no joint swelling, erythema, warmth, or tenderness.  ROM of all joints intact. Skin - no sores or suspicious lesions or rashes or color changes   Pertinent labs:  Lab Results  Component Value Date   TSH 4.40 06/08/2018   Lab Results  Component Value Date   WBC 7.7 07/12/2020   HGB 12.7 (L) 07/12/2020   HCT 38.8 (L) 07/12/2020   MCV 88.9 07/12/2020   PLT 208.0 07/12/2020   Lab Results  Component Value Date   CREATININE 1.34 01/03/2021   BUN 21 01/03/2021   NA 139 01/03/2021   K 4.6 01/03/2021   CL 101 01/03/2021   CO2 30 01/03/2021   Lab Results  Component Value Date   ALT 10 07/12/2020   AST 14 07/12/2020   ALKPHOS 70 07/12/2020   BILITOT 1.1 07/12/2020   Lab Results  Component Value Date   CHOL 97 07/12/2020   Lab Results  Component Value Date   HDL 36.50 (L) 07/12/2020   Lab Results  Component Value Date   LDLCALC 49 07/12/2020   Lab Results  Component Value Date   TRIG 61.0 07/12/2020   Lab Results  Component Value Date   CHOLHDL 3 07/12/2020   Lab Results  Component Value Date   PSA 0.35 07/12/2020   PSA 0.71 07/06/2019   PSA 0.26 03/09/2018   Lab Results  Component Value Date   HGBA1C 7.8 (H) 01/03/2021   ASSESSMENT AND PLAN:  1) DM 2: sounds like control is pretty good as usual. Hba1c and urine microalb/cr today.  2) HTN: bp not well controlled lately but pt states his stress is contributing so much that he wants to hold off on any changes in med mgmt at this time. If elevated bp's persist in the absence of increased stress level then we'd likely cautiously increase his lisinopril to 30mg  qd as next step. Lytes/cr today.  3) CRI III: avoids NSAIDs. Sounds like he can liberalize fluid intake a little.  4) chronic a-fib: asymptomatic.  Cont extended  release diltiazem 240mg  qd and lopressor 75mg  bid, plus stroke prophylaxis with xarelto 20mg  qd. CBC + lytes/cr today.  5) OSA, high risk for/question of: discussed past w/u in 2020 that was very uncomfortable for him and resulted in sleep study that no conclusions could be drawn from.  He refused repeat sleep study and doesn't want to pursue re-eval at this time (if he did then we'd refer to [facility] pulm/sleep med).  6) Chronic bilat LE lymphedema with recurrent stasis ulcers and cellulitis-->cont wound care clinic, cont monthly pen G prophylaxis here.  7) Health maintenance exam: Reviewed age and gender appropriate health maintenance issues (prudent diet, regular exercise, health risks of tobacco and excessive alcohol, use of seatbelts, fire alarms in home, use of sunscreen).  Also reviewed age and gender appropriate health screening as well as vaccine recommendations. Vaccines: Tdap due->rx sent to [facility] today.  Shingrix discussed->pt declined b/c of cost.  Otherwise ALL UTD. Labs: cbc, cmet, flp, a1c, urine microalb/cr. Prostate ca screening: annual PSA due after 07/2021. Colon ca screening: his cologuard was positive 02/2021-->referred pt to GI->pt had a mix-up and this has not been arranged-->new referral ordered today.  An After Visit Summary was printed and given to the patient.  FOLLOW UP:  Return in about 3 months (around 07/05/2021) for routine chronic illness f/u.  Signed:  2021, MD           04/04/2021

## 2021-04-04 ENCOUNTER — Encounter: Payer: Self-pay | Admitting: Family Medicine

## 2021-04-04 ENCOUNTER — Encounter (HOSPITAL_BASED_OUTPATIENT_CLINIC_OR_DEPARTMENT_OTHER): Payer: Medicare Other | Attending: Physician Assistant | Admitting: Internal Medicine

## 2021-04-04 ENCOUNTER — Ambulatory Visit (INDEPENDENT_AMBULATORY_CARE_PROVIDER_SITE_OTHER): Payer: Medicare Other | Admitting: Family Medicine

## 2021-04-04 ENCOUNTER — Other Ambulatory Visit: Payer: Self-pay

## 2021-04-04 VITALS — BP 153/65 | HR 63 | Temp 97.6°F | Resp 16 | Ht 68.5 in | Wt 383.2 lb

## 2021-04-04 DIAGNOSIS — I872 Venous insufficiency (chronic) (peripheral): Secondary | ICD-10-CM | POA: Insufficient documentation

## 2021-04-04 DIAGNOSIS — E1140 Type 2 diabetes mellitus with diabetic neuropathy, unspecified: Secondary | ICD-10-CM | POA: Insufficient documentation

## 2021-04-04 DIAGNOSIS — I890 Lymphedema, not elsewhere classified: Secondary | ICD-10-CM | POA: Insufficient documentation

## 2021-04-04 DIAGNOSIS — E11622 Type 2 diabetes mellitus with other skin ulcer: Secondary | ICD-10-CM | POA: Diagnosis not present

## 2021-04-04 DIAGNOSIS — R195 Other fecal abnormalities: Secondary | ICD-10-CM | POA: Diagnosis not present

## 2021-04-04 DIAGNOSIS — E11621 Type 2 diabetes mellitus with foot ulcer: Secondary | ICD-10-CM | POA: Insufficient documentation

## 2021-04-04 DIAGNOSIS — Z794 Long term (current) use of insulin: Secondary | ICD-10-CM

## 2021-04-04 DIAGNOSIS — L97521 Non-pressure chronic ulcer of other 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 of skin: Secondary | ICD-10-CM | POA: Diagnosis not present

## 2021-04-04 DIAGNOSIS — E1122 Type 2 diabetes mellitus with diabetic chronic kidney disease: Secondary | ICD-10-CM | POA: Diagnosis not present

## 2021-04-04 DIAGNOSIS — Z7901 Long term (current) use of anticoagulants: Secondary | ICD-10-CM | POA: Diagnosis not present

## 2021-04-04 DIAGNOSIS — I272 Pulmonary hypertension, unspecified: Secondary | ICD-10-CM | POA: Diagnosis not present

## 2021-04-04 DIAGNOSIS — Z9119 Patient's noncompliance with other medical treatment and regimen: Secondary | ICD-10-CM | POA: Insufficient documentation

## 2021-04-04 DIAGNOSIS — I482 Chronic atrial fibrillation, unspecified: Secondary | ICD-10-CM | POA: Insufficient documentation

## 2021-04-04 DIAGNOSIS — N1831 Chronic kidney disease, stage 3a: Secondary | ICD-10-CM

## 2021-04-04 DIAGNOSIS — N183 Chronic kidney disease, stage 3 unspecified: Secondary | ICD-10-CM

## 2021-04-04 DIAGNOSIS — I87333 Chronic venous hypertension (idiopathic) with ulcer and inflammation of bilateral lower extremity: Secondary | ICD-10-CM | POA: Diagnosis not present

## 2021-04-04 DIAGNOSIS — E1151 Type 2 diabetes mellitus with diabetic peripheral angiopathy without gangrene: Secondary | ICD-10-CM | POA: Insufficient documentation

## 2021-04-04 DIAGNOSIS — L03119 Cellulitis of unspecified part of limb: Secondary | ICD-10-CM | POA: Diagnosis not present

## 2021-04-04 DIAGNOSIS — L97519 Non-pressure chronic ulcer of other part of right foot with unspecified severity: Secondary | ICD-10-CM | POA: Diagnosis not present

## 2021-04-04 DIAGNOSIS — L97829 Non-pressure chronic ulcer of other part of left lower leg with unspecified severity: Secondary | ICD-10-CM | POA: Insufficient documentation

## 2021-04-04 DIAGNOSIS — Z0001 Encounter for general adult medical examination with abnormal findings: Secondary | ICD-10-CM

## 2021-04-04 DIAGNOSIS — Z23 Encounter for immunization: Secondary | ICD-10-CM

## 2021-04-04 DIAGNOSIS — I10 Essential (primary) hypertension: Secondary | ICD-10-CM

## 2021-04-04 MED ORDER — TETANUS-DIPHTH-ACELL PERTUSSIS 5-2-15.5 LF-MCG/0.5 IM SUSP: 0.5000 mL | Freq: Once | INTRAMUSCULAR | 0 refills | Status: AC

## 2021-04-04 NOTE — Patient Instructions (Signed)

## 2021-04-04 NOTE — Progress Notes (Signed)
IZEK, CORVINO ([id number]) Visit Report for 04/04/2021 Chief Complaint Document Details Patient Name: Date of Service: CO LUCCIANO, VITALI 04/04/2021 10:30 A M Medical Record Number: [id number] Patient Account Number: [id number] Date of Birth/Sex: Treating RN: [birth year] (70 y.o. [name] Primary Care Provider: Nicoletta Ba Other Clinician: Referring Provider: Treating Provider/Extender: Debe Coder in Treatment: 271 Information Obtained from: Patient Chief Complaint patient is here for evaluation of venous/lymphedema ulcers Electronic Signature(s) Signed: 04/04/2021 11:44:44 AM By: Geralyn Corwin DO Entered By: Geralyn Corwin on 04/04/2021 11:35:02 -------------------------------------------------------------------------------- HPI Details Patient Name: Date of Service: [name], Shoua J. 04/04/2021 10:30 A M Medical Record Number: [id number] Patient Account Number: [id number] Date of Birth/Sex: Treating RN: December 25, 1950 (70 y.o. [name] Primary Care Provider: Nicoletta Ba Other Clinician: Referring Provider: Treating Provider/Extender: Debe Coder in Treatment: 271 History of Present Illness HPI Description: Referred by PCP for consultation. Patient has long standing history of BLE venous stasis, no prior ulcerations. At beginning of month, developed cellulitis and weeping. Received IM Rocephin followed by Keflex and resolved. Wears compression stocking, appr 6 months old. Not sure strength. No present drainage. 01/22/16 this is a patient who is a type II diabetic on insulin. 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 He tells me he religiously wears pressure stockings of uncertain strength. He was here with weeping edema about 8 months ago but did not have an open wound. Roughly a month ago he had a reopening on his bilateral legs. He is been using bandages and Neosporin. He  does not complain of pain. 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 Last BUN/creatinine I have is from 11/20/15 at 13 and 1.0 respectively 01/29/16; patient arrives today having tolerated the Profore wrap. He brought in his stockings and these are 18 mmHg stockings he bought from [place]. The compression here is likely inadequate. He does not complain of pain or excessive drainage she has no systemic symptoms. The wound on the right looks improved as does the one on the left although one on the left is more substantial with still tissue at risk below the actual wound area on the bilateral posterior calf 02/05/16; patient arrives with poor edema control. He states that we did put a 4 layer compression on it last week. No weight appear 5 this. 02/12/16; the area on the posterior right Has healed. The left Has a substantial wound that has necrotic surface eschar that requires a debridement with a curette. 02/16/16;the patient called or a Nurse visit secondary to increased swelling. He had been in earlier in the week with his right leg healed. He was transitioned to is on pressure stocking on the right leg with the only open wound on the left, a substantial area on the left posterior calf. Note he has a history of severe lower extremity edema, he has a history of chronic atrial fibrillation but not heart failure per my notes but I'll need to research this. He is not complaining of chest pain shortness of breath or orthopnea. The intake nurse noted blisters on the previously closed right leg 02/19/16; this is the patient's regular visit day. I see him on Friday with escalating edema new wounds on the right leg and clear signs of at least right ventricular heart failure. I increased his Lasix to 40 twice a day. He is returning  currently in follow-up. States he is noticed a decrease in that the  edema 02/26/16 patient's legs have much less edema. There is nothing really open on the right leg. The left leg has improved condition of the large superficial wound on the posterior left leg 03/04/16; edema control is very much better. The patient's right leg wounds have healed. On the left leg he continues to have severe venous inflammation on the posterior aspect of the left leg. There is no tenderness and I don't think any of this is cellulitis. 03/11/16; patient's right leg is married healed and he is in his own stocking. The patient's left leg has deteriorated somewhat. There is a lot of erythema around the wound on the posterior left leg. There is also a significant rim of erythema posteriorly just above where the wrap would've ended there is a new wound in this location and a lot of tenderness. Can't rule out cellulitis in this area. 03/15/16; patient's right leg remains healed and he is in his own stocking. The patient's left leg is much better than last review. His major wound on the posterior aspect of his left Is almost fully epithelialized. He has 3 small injuries from the wraps. Really. Erythema seems a lot better on antibiotics 03/18/16; right leg remains healed and he is in his own stocking. The patient's left leg is much better. The area on the posterior aspect of the left calf is fully epithelialized. His 3 small injuries which were wrap injuries on the left are improved only one seems still open his erythema has resolved 03/25/16; patient's right leg remains healed and he is in his own stocking. There is no open area today on the left leg posterior leg is completely closed up. His wrap injuries at the superior aspect of his leg are also resolved. He looks as though he has some irritation on the dorsal ankle but this is fully epithelialized without evidence of infection. 03/28/16; we discharged this patient on Monday. Transitioned him into his own stocking. There were problems almost  immediately with uncontrolled swelling weeping edema multiple some of which have opened. He does not feel systemically unwell in particular no chest pain no shortness of breath and he does not feel 04/08/16; the edema is under better control with the Profore light wrap but he still has pitting edema. There is one large wound anteriorly 2 on the medial aspect of his left leg and 3 small areas on the superior posterior calf. Drainage is not excessive he is tolerating a Profore light well 04/15/16; put a Profore wrap on him last week. This is controlled is edema however he had a lot of pain on his left anterior foot most of his wounds are healed 04/22/16 once again the patient has denuded areas on the left anterior foot which he states are because his wrap slips up word. He saw his primary physician today is on Lasix 40 twice a day and states that he his weight is down 20 pounds over the last 3 months. 04/29/16: Much improved. left anterior foot much improved. He is now on Lasix 80 mg per day. Much improved edema control 05/06/16; I was hoping to be able to discharge him today however once again he has blisters at a low level of where the compression was placed last week mostly on his left lateral but also his left medial leg and a small area on the anterior part of the left foot. 05/09/16; apparently the patient went home after his  appointment on 7/4 later in the evening developing pain in his upper medial thigh together with subjective fever and chills although his temperature was not taken. The pain was so intense he felt he would probably have to call 911. However he then remembered that he had leftover doxycycline from a previous round of antibiotics and took these. By the next morning he felt a lot better. He called and spoke to one of our nurses and I approved doxycycline over the phone thinking that this was in relation to the wounds we had previously seen although they were definitely were not. The  patient feels a lot better old fever no chills he is still working. Blood sugars are reasonably controlled 05/13/16; patient is back in for review of his cellulitis on his anterior medial upper thigh. He is taking doxycycline this is a lot better. Culture I did of the nodular area on the dorsal aspect of his foot grew MRSA this also looks a lot better. 05/20/16; the patient is cellulitis on the medial upper thigh has resolved. All of his wound areas including the left anterior foot, areas on the medial aspect of the left calf and the lateral aspect of the calf at all resolved. He has a new blister on the left dorsal foot at the level of the fourth toe this was excised. No evidence of infection 05/27/16; patient continues to complain weeping edema. He has new blisterlike wounds on the left anterior lateral and posterior lateral calf at the top of his wrap levels. The area on his left anterior foot appears better. He is not complaining of fever, pain or pruritus in his feet. 05/30/16; the patient's blisters on his left anterior leg posterior calf all look improved. He did not increase the Lasix 100 mg as I suggested because he was going to run out of his 40 mg tablets. He is still having weeping edema of his toes 06/03/16; I renewed his Lasix at 80 mg once a day as he was about to run out when I last saw him. He is on 80 mg of Lasix now. I have asked him to cut down on the excessive amount of water he was drinking and asked him to drink according to his thirst mechanisms 06/12/2016 -- was seen 2 days ago and was supposed to wear his compression stockings at home but he is developed lymphedema and superficial blisters on the left lower extremity and hence came in for a review 06/24/16; the remaining wound is on his left anterior leg. He still has edema coming from between his toes. There is lymphedema here however his edema is generally better than when I last saw this. He has a history of atrial fibrillation  but does not have a known history of congestive heart failure nevertheless I think he probably has this at least on a diastolic basis. 07/01/16 I reviewed his echocardiogram from January 2017. This was essentially normal. He did not have LVH, EF of 55-60%. His right ventricular function was normal although he did have trivial tricuspid and pulmonic regurgitation. This is not audible on exam however. I increased his Lasix to do massive edema in his legs well above his knees I think in early July. He was also drinking an excessive amount of water at the time. 07/15/16; missed his appointment last week because of the Labor Day holiday on Monday. He could not get another appointment later in the week. Started to feel the wrap digging in superiorly so we remove the  top half and the bottom half of his wrap. He has extensive erythema and blistering superiorly in the left leg. Very tender. Very swollen. Edema in his foot with leaking edema fluid. He has not been systemically unwell 07/22/16; the area on the left leg laterally required some debridement. The medial wounds look more stable. His wrap injury wounds appear to have healed. Edema and his foot is better, weeping edema is also better. He tells me he is meeting with the supplier of the external compression pumps at work 08/05/16; the patient was on vacation last week in [facility]. His wrap is been on for an extended period of time. Also over the weekend he developed an extensive area of tender erythema across his anterior medial thigh. He took to doxycycline yesterday that he had leftover from a previous prescription. The patient complains of weeping edema coming out of his toes 08/08/16; I saw this patient on 10/2. He was tender across his anterior thigh. I put him on doxycycline. He returns today in follow-up. He does not have any open wounds on his lower leg, he still has edema weeping into his toes. 08/12/16; patient was seen back urgently today to  follow-up for his extensive left thigh cellulitis/erysipelas. He comes back with a lot less swelling and erythema pain is much better. I believe I gave him Augmentin and Cipro. His wrap was cut down as he stated a roll down his legs. He developed blistering above the level of the wrap that remained. He has 2 open blisters and 1 intact. 08/19/16; patient is been doing his primary doctor who is increased his Lasix from 40-80 once a day or 80 already has less edema. Cellulitis has remained improved in the left thigh. 2 open areas on the posterior left calf 08/26/16; he returns today having new open blisters on the anterior part of his left leg. He has his compression pumps but is not yet been shown how to use some vital representative from the supplier. 09/02/16 patient returns today with no open wounds on the left leg. Some maceration in his plantar toes 09/10/2016 -- Dr. Leanord Hawking had recently discharged him on 09/02/2016 and he has come right back with redness swelling and some open ulcers on his left lower extremity. He says this was caused by trying to apply his compression stockings and he's been unable to use this and has not been able to use his lymphedema pumps. He had some doxycycline leftover and he has started on this a few days ago. 09/16/16; there are no open wounds on his leg on the left and no evidence of cellulitis. 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 He does not complain of symptoms here. I am not clear use using his external compression pumps. 09/23/16; I have not seen this patient in 2 weeks. He canceled his appointment 10 days ago as he was going on vacation. He tells me that on Monday he noticed a large area on his posterior left leg which is been draining copiously and is reopened into a large wound. He is been using ABDs and the external part of his juxtalite, according to our nurse this was not on properly. 10/07/16;  Still a substantial area on the posterior left leg. Using silver alginate 10/14/16; in general better although there is still open area which looks healthy. Still using silver alginate. He reminds me that this happen before he left for [facility]. T oday while  he was showering in the morning. He had been using his juxtalite's 10/21/16; the area on his posterior left leg is fully epithelialized. However he arrives today with a large area of tender erythema in his medial and posterior left thigh just above the knee. I have marked the area. Once again he is reluctant to consider hospitalization. I treated him with oral antibiotics in the past for a similar situation with resolution I think with doxycycline however this area it seems more extensive to me. He is not complaining of fever but does have chills and says states he is thirsty. His blood sugar today was in the 140s at home 10/25/16 the area on his posterior left leg is fully epithelialized although there is still some weeping edema. The large area of tenderness and erythema in his medial and posterior left thigh is a lot less tender although there is still a lot of swelling in this thigh. He states he feels a lot better. He is on doxycycline and Augmentin that I started last week. This will continued until Tuesday, December 26. I have ordered a duplex ultrasound of the left thigh rule out DVT whether there is an abscess something that would need to be drained I would also like to know. 11/01/16; he still has weeping edema from a not fully epithelialized area on his left posterior calf. Most of the rest of this looks a lot better. He has completed his antibiotics. His thigh is a lot better. Duplex ultrasound did not show a DVT in the thigh 11/08/16; he comes in today with more Denuded surface epithelium from the posterior aspect of his calf. There is no real evidence of cellulitis. The superior aspect of his wrap appears to have put quite an  indentation in his leg just below the knee and this may have contributed. He does not complain of pain or fever. We have been using silver alginate as the primary dressing. The area of cellulitis in the right thigh has totally resolved. He has been using his compression stockings once a week 11/15/16; the patient arrives today with more loss of epithelium from the posterior aspect of his left calf. He now has a fairly substantial wound in this area. The reason behind this deterioration isn't exactly clear although his edema is not well controlled. He states he feels he is generally more swollen systemically. He is not complaining of chest pain shortness of breath fever. T me he has an appointment with his primary physician in early February. He is on 80 mg of oral ells Lasix a day. He claims compliance with the external compression pumps. He is not having any pain in his legs similar to what he has with his recurrent cellulitis 11/22/16; the patient arrives a follow-up of his large area on his left lateral calf. This looks somewhat better today. He came in earlier in the week for a dressing change since I saw him a week ago. He is not complaining of any pain no shortness of breath no chest pain 11/28/16; the patient arrives for follow-up of his large area on the left lateral calf this does not look better. In fact it is larger weeping edema. The surface of the wound does not look too bad. We have been using silver alginate although I'm not certain that this is a dressing issue. 12/05/16; again the patient follows up for a large wound on the left lateral and left posterior calf this does not look better. There continues to be weeping  edema necrotic surface tissue. More worrisome than this once again there is erythema below the wound involving the distal Achilles and heel suggestive of cellulitis. He is on his feet working most of the day of this is not going well. We are changing his dressing twice a week to  facilitate the drainage. 12/12/16; not much change in the overall dimensions of the large area on the left posterior calf. This is very inflamed looking. I gave him an. Doxycycline last week does not really seem to have helped. He found the wrap very painful indeed it seems to of dog into his legs superiorly and perhaps around the heel. He came in early today because the drainage had soaked through his dressings. 12/19/16- patient arrives for follow-up evaluation of his left lower extremity ulcers. He states that he is using his lymphedema pumps once daily when there is "no drainage". He admits to not using his lipedema pumps while under current treatment. His blood sugars have been consistently between 150-200. 12/26/16; the patient is not using his compression pumps at home because of the wetness on his feet. I've advised him that I think it's important for him to use this daily. He finds his feet too wet, he can put a plastic bag over his legs while he is in the pumps. Otherwise I think will be in a vicious circle. We are using silver alginate to the major area on his left posterior calf 01/02/17; the patient's posterior left leg has further of all into 3 open wounds. All of them covered with a necrotic surface. He claims to be using his compression pumps once a day. His edema control is marginal. Continue with silver alginate 01/10/17; the patient's left posterior leg actually looks somewhat better. There is less edema, less erythema. Still has 3 open areas covered with a necrotic surface requiring debridement. He claims to be using his compression pumps once a day his edema control is better 01/17/17; the patient's left posterior calf look better last week when I saw him and his wrap was changed 2 days ago. He has noted increasing pain in the left heel and arrives today with much larger wounds extensive erythema extending down into the entire heel area especially tender medially. He is not  systemically unwell CBGs have been controlled no fever. Our intake nurse showed me limegreen drainage on his AVD pads. 01/24/17; his usual this patient responds nicely to antibiotics last week giving him Levaquin for presumed Pseudomonas. The whole entire posterior part of his leg is much better much less inflamed and in the case of his Achilles heel area much less tender. He has also had some epithelialization posteriorly there are still open areas here and still draining but overall considerably better 01/31/17- He has continue to tolerate the compression wraps. he states that he continues to use the lymphedema pumps daily, and can increase to twice daily on the weekends. He is voicing no complaints or concerns regarding his LLE ulcers 02/07/17-he is here for follow-up evaluation. He states that he noted some erythema to the left medial and anterior thigh, which he states is new as of yesterday. He is concerned about recurrent cellulitis. He states his blood sugars have been slightly elevated, this morning in the 180s 02/14/17; he is here for follow-up evaluation. When he was last here there was erythema superiorly from his posterior wound in his anterior thigh. He was prescribed Levaquin however a culture of the wound surface grew MRSA over the phone I changed  him to doxycycline on Monday and things seem to be a lot better. 02/24/17; patient missed his appointment on Friday therefore we changed his nurse visit into a physician visit today. Still using silver alginate on the large area of the posterior left thigh. He isn't new area on the dorsal left second toe 03/03/17; actually better today although he admits he has not used his external compression pumps in the last 2 days or so because of work responsibilities over the weekend. 03/10/17; continued improvement. External compression pumps once a day almost all of his wounds have closed on the posterior left calf. Better edema control 03/17/17; in general  improved. He still has 3 small open areas on the lateral aspect of his left leg however most of the area on the posterior part of his leg is epithelialized. He has better edema control. He has an ABD pad under his stocking on the right anterior lower leg although he did not let us look at that today. 03/24/17; patient arrives back in clinic today with no open areas however there are areas on the posterior left calf and anterior left calf that are less than 100% epithelialized. His edema is well controlled in the left lower leg. There is some pitting edema probably lymphedema in the left upper thigh. He uses compression pumps at home once per day. I tried to get him to do this twice a day although he is very reticent. 04/01/2017 -- for the last 2 days he's had significant redness, tenderness and weeping and came in for an urgent visit today. 04/07/17; patient still has 6 more days of doxycycline. He was seen by Dr. Meyer Russel last Wednesday for cellulitis involving the posterior aspect, lateral aspect of his Involving his heel. For the most part he is better there is less erythema and less weeping. He has been on his feet for 12 hours 2 over the weekend. Using his compression pumps once a day 04/14/17 arrives today with continued improvement. Only one area on the posterior left calf that is not fully epithelialized. He has intense bilateral venous inflammation associated with his chronic venous insufficiency disease and secondary lymphedema. We have been using silver alginate to the left posterior calf wound In passing he tells [place] today that the right leg but we have not seen in quite some time has an open area on it but he doesn't want [place] to look at this today states he will show this to [place] next week. 04/21/17; there is no open area on his left leg although he still reports some weeping edema. He showed [place] his right leg today which is the first time we've seen this leg in a long time. He has a large area of  open wound on the right leg anteriorly healthy granulation. Quite a bit of swelling in the right leg and some degree of venous inflammation. He told us about the right leg in passing last week but states that deterioration in the right leg really only happened over the weekend 04/28/17; there is no open area on the left leg although there is an irritated part on the posterior which is like a wrap injury. The wound on the right leg which was new from last week at least to [place] is a lot better. 05/05/17; still no open area on the left leg. Patient is using his new compression stocking which seems to be doing a good job of controlling the edema. He states he is using his compression pumps once per  day. The right leg still has an open wound although it is better in terms of surface area. Required debridement. A lot of pain in the posterior right Achilles marked tenderness. Usually this type of presentation this patient gives concern for an active cellulitis 05/12/17; patient arrives today with his major wound from last week on the right lateral leg somewhat better. Still requiring debridement. He was using his compression stocking on the left leg however that is reopened with superficial wounds anteriorly he did not have an open wound on this leg previously. He is still using his juxta light's once daily at night. He cannot find the time to do this in the morning as he has to be at work by 7 AM 05/19/17; right lateral leg wound looks improved. No debridement required. The concerning area is on the left posterior leg which appears to almost have a subcutaneous hemorrhagic component to it. We've been using silver alginate to all the wounds 05/26/17; the right lateral leg wound continues to look improved. However the area on the left posterior calf is a tightly adherent surface. [place] using silver alginate. Because of the weeping edema in his legs there is very little good alternatives. 06/02/17; the patient left  here last week looking quite good. Major wound on the left posterior calf and a small one on the right lateral calf. Both of these look satisfactory. He tells me that by Wednesday he had noted increased pain in the left leg and drainage. He called on Thursday and Friday to get an appointment here but we were blocked. He did not go to urgent care or his primary physician. He thinks he had a fever on Thursday but did not actually take his temperature. He has not been using his compression pumps on the left leg because of pain. I advised him to go to the emergency room today for IV antibiotics for stents of left leg cellulitis but he has refused I have asked him to take 2 days off work to keep his leg elevated and he has refused this as well. In view of this I'm going to call him and Augmentin and doxycycline. 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 However after a detailed discussion regarding the risk benefits and alternatives he refuses to get admitted to the hospital. With no other recourse I will continue him on oral antibiotics as before and hopefully he'll have his infectious disease consultation this week. 06/16/2017 -- the patient was seen today by the nurse practitioner at infectious disease Ms. Dixon. 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 He has also been advise Lotrimin cream locally. she also had wise IV antibiotics for his condition if it flares up 06/23/17; patient arrives today with drainage bilaterally although the remaining wound on the left posterior calf after  cleaning up today "highlighter yellow drainage" did not look too bad. Unfortunately he has had breakdown on the right anterior leg [previously this leg had not been open and he is using a black stocking] he went to see infectious disease and is been  put on clindamycin 150 mg daily, I did not verify the dose although I'm not familiar with using clindamycin in this dosing range, perhaps for prophylaxisoo 06/27/17; I brought this patient back today to follow-up on the wound deterioration on the right lower leg together with surrounding cellulitis. I started him on doxycycline 4 days ago. This area looks better however he comes in today with intense cellulitis on the medial part of his left thigh. This is not have a wound in this area. Extremely tender. We've been using silver alginate to the wounds on the right lower leg left lower leg with bilateral 4 layer compression he is using his external compression pumps once a day 07/04/17; patient's left medial thigh cellulitis looks better. He has not been using his compression pumps as his insert said it was contraindicated with cellulitis. His right leg continues to make improvements all the wounds are still open. We only have one remaining wound on the left posterior calf. Using silver alginate to all open areas. He is on doxycycline which I started a week ago and should be finishing I gave him Augmentin after Thursday's visit for the severe cellulitis on the left medial thigh which fortunately looks better 07/14/17; the patient's left medial thigh cellulitis has resolved. The cellulitis in his right lower calf on the right also looks better. All of his wounds are stable to improved we've been using silver alginate he has completed the antibiotics I have given him. He has clindamycin 150 mg once a day prescribed by infectious disease for prophylaxis, I've advised him to start this now. We have been using bilateral Unna boots over silver alginate to the wound  areas 07/21/17; the patient is been to see infectious disease who noted his recurrent problems with cellulitis. He was not able to tolerate prophylactic clindamycin therefore he is on amoxicillin 500 twice a day. He also had a second daily dose of Lasix added By Dr. Oneta Rack but he is not taking this. Nor is he being completely compliant with his compression pumps a especially not this week. He has 2 remaining wounds one on the right posterior lateral lower leg and one on the left posterior medial lower leg. 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 The patient has Unna boots bilaterally. 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 He has bilateral Unna boots for compression and silver alginate to his wounds. Arrives today with his legs looking as good as I have seen him in quite some time. Not surprisingly his wounds look better as well with improvement on the right lateral leg venous insufficiency wound and also the left medial leg. He is still using the compression pumps once a day 08/11/17; both legs appear to be doing better wounds on the right lateral and left medial legs look better. Skin on the right leg quite good. He is been using silver alginate as the primary dressing. I'm going to use Anasept gel calcium alginate and maintain all the secondary dressings 08/18/17; the patient continues to actually do quite well. The area on his right lateral leg is just about closed the left medial also looks better although it is still moist in this area. His edema is well controlled we have been using Anasept gel with calcium alginate and the usual secondary  dressings, 4 layer compression and once daily use of his compression pumps "always been able to manage 09/01/17; the patient  continues to do reasonably well in spite of his trip to [place]. The area on the right lateral leg is epithelialized. Left is much better but still open. He has more edema and more chronic erythema on the left leg [venous inflammation] 09/08/17; he arrives today with no open wound on the right lateral leg and decently controlled edema. Unfortunately his left leg is not nearly as in his good situation as last week.he apparently had increasing edema starting on Saturday. He edema soaked through into his foot so used a plastic bag to walk around his home. The area on the medial right leg which was his open area is about the same however he has lost surface epithelium on the left lateral which is new and he has significant pain in the Achilles area of the left foot. He is already on amoxicillin chronically for prophylaxis of cellulitis in the left leg 09/15/17; he is completed a week of doxycycline and the cellulitis in the left posterior leg and Achilles area is as usual improved. He still has a lot of edema and fluid soaking through his dressings. There is no open wound on the right leg. He saw infectious disease NP today 09/22/17;As usual 1 we transition him from our compression wraps to his stockings things did not go well. He has several small open areas on the right leg. He states this was caused by the compression wrap on his skin although he did not wear this with the stockings over them. He has several superficial areas on the left leg medially laterally posteriorly. He does not have any evidence of active cellulitis especially involving the left Achilles [facility] [place] He states he isn't attempting to get an appointment with a heel objects wound center there to change his dressings. I am not completely certain whether this will work 10/06/17; the patient came in on Friday for a nurse visit and the nurse reported that his legs actually look  quite good. He arrives in clinic today for his regular follow-up visit. He has a new wound on his left third toe over the PIP probably caused by friction with his footwear. He has small areas on the left leg and a very superficial but epithelialized area on the right anterior lateral lower leg. Other than that his legs look as good as I've seen him in quite some time. We have been using silver alginate Review of systems; no chest pain no shortness of breath other than this a 10 point review of systems negative 10/20/17; seen by Dr. Meyer RusselBritto last week. He had taken some antibiotics [doxycycline] that he had left over. Dr. Meyer RusselBritto thought he had candida infection and declined to give him further antibiotics. He has a small wound remaining on the right lateral leg several areas on the left leg including a larger area on the left posterior several left medial and anterior and a small wound on the left lateral. The area on the left dorsal third toe looks a lot better. ROS; Gen.; no fever, respiratory no cough no sputum Cardiac no chest pain other than this 10 point review of system is negative 10/30/17; patient arrives today having fallen in the bathtub 3 days ago. It took him a while to get up. He has pain and maceration in the wounds on his left leg which have  deteriorated. He has not been using his pumps he also has some maceration on the right lateral leg. 11/03/17; patient continues to have weeping edema especially in the left leg. This saturates his dressings which were just put on on 12/27. As usual the doxycycline seems to take care of the cellulitis on his lower leg. He is not complaining of fever, chills, or other systemic symptoms. He states his leg feels a lot better on the doxycycline I gave him empirically. He also apparently gets injections at his primary doctor's officeo Rocephin for cellulitis prophylaxis. I didn't ask him about his compression pump compliance today I think that's probably  marginal. Arrives in the clinic with all of his dressings primary and secondary macerated full of fluid and he has bilateral edema 11/10/17; the patient's right leg looks some better although there is still a cluster of wounds on the right lateral. The left leg is inflamed with almost circumferential skin loss medially to laterally although we are still maintaining anteriorly. He does not have overt cellulitis there is a lot of drainage. He is not using compression pumps. We have been using silver alginate to the wound areas, there are not a lot of options here 11/17/17; the patient's right leg continues to be stable although there is still open wounds, better than last week. The inflammation in the left leg is better. Still loss of surface layer epithelium especially posteriorly. There is no overt cellulitis in the amount of edema and his left leg is really quite good, tells me he is using his compression pumps once a day. 11/24/17; patient's right leg has a small superficial wound laterally this continues to improve. The inflammation in the left leg is still improving however we have continuous surface layer epithelial loss posteriorly. There is no overt cellulitis in the amount of edema in both legs is really quite good. He states he is using his compression pumps on the left leg once a day for 5 out of 7 days 12/01/17; very small superficial areas on the right lateral leg continue to improve. Edema control in both legs is better today. He has continued loss of surface epithelialization and left posterior calf although I think this is better. We have been using silver alginate with large number of absorptive secondary dressings 4 layer on the left Unna boot on the right at his request. He tells me he is using his compression pumps once a day 12/08/17; he has no open area on the right leg is edema control is good here. On the left leg however he has marked erythema and tenderness breakdown of skin. He has  what appears to be a wrap injury just distal to the popliteal fossa. 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 There was swelling erythema and breakdown in the right leg. He has much worse skin breakdown in the left leg as well multiple open areas medially and posteriorly as well as laterally. He tells me he has been using his compression pumps but tells me he feels that the drainage out of his leg is worse when he uses a compression pumps. T be fair to him he is been saying this o for a while however I don't know that I  have really been listening to this. I wonder if the compression pumps are working properly 12/22/17;. Once again he arrives with severe erythema, weeping edema from the left greater than right leg. Noncompliance with compression pumps. New this visit he is complaining of pain on the lateral aspect of the right leg and the medial aspect of his right thigh. He apparently saw his cardiologist Dr. [name] who was ordered an echocardiogram area and I think this is a step in the right direction 12/25/17; started his doxycycline Monday night. There is still intense erythema of the right leg especially in the anterior thigh although there is less tenderness. The erythema around the wound on the right lateral calf also is less tender. He still complaining of pain in the left heel. His wounds are about the same right lateral left medial left lateral. Superficial but certainly not close to closure. He denies being systemically unwell no fever chills no abdominal pain no diarrhea 12/29/17; back in follow-up of his extensive right calf and right thigh cellulitis. I added amoxicillin to cover possible doxycycline resistant  strep. This seems to of done the trick he is in much less pain there is much less erythema and swelling. He has his echocardiogram at 11:00 this morning. X-ray of the left heel was also negative. 01/05/18; the patient arrived with his edema under much better control. Now that he is retired he is able to use his compression pumps daily and sometimes twice a day per the patient. He has a wound on the right leg the lateral wound looks better. Area on the left leg also looks a lot better. He has no evidence of cellulitis in his bilateral thighs I had a quick peak at his echocardiogram. He is in normal ejection fraction and normal left ventricular function. He has moderate pulmonary hypertension moderately reduced right ventricular function. One would have to wonder about chronic sleep apnea although he says he doesn't snore. He'll review the echocardiogram with his cardiologist. 01/12/18; the patient arrives with the edema in both legs under exemplary control. He is using his compression pumps daily and sometimes twice daily. His wound on the right lateral leg is just about closed. He still has some weeping areas on the posterior left calf and lateral left calf although everything is just about closed here as well. I have spoken with Aldean Baker who is the patient's nurse practitioner and infectious disease. She was concerned that the patient had not understood that the parenteral penicillin injections he was receiving for cellulitis prophylaxis was actually benefiting him. I don't think the patient actually saw that I would tend to agree we were certainly dealing with less infections although he had a serious one last month. 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 He is healed. He'll be placed in juxtalite compression wraps and increase his lymphedema pumps to twice daily. We will follow up again next week to ensure there are no issues with the new  regiment. 01/20/18-he is here for evaluation of bilateral lower extremity weeping edema. Yesterday he was placed in compression wrap to the right lower extremity and compression stocking to left lower shrubbery. He states he uses lymphedema pumps last night and again this morning and noted a blister to the left lower extremity. On exam he was noted to have drainage to the right lower extremity. He will be placed in Unna boots bilaterally and follow-up next week 01/26/18; patient was actually discharged a week ago to his  own juxta light stockings only to return the next day with bilateral lower extremity weeping edema.he was placed in bilateral Unna boots. He arrives today with pain in the back of his left leg. There is no open area on the right leg however there is a linear/wrap injury on the left leg and weeping edema on the left leg posteriorly. I spoke with infectious disease about 10 days ago. They were disappointed that the patient elected to discontinue prophylactic intramuscular penicillin shots as they felt it was particularly beneficial in reducing the frequency of his cellulitis. I discussed this with the patient today. He does not share this view. He'll definitely need antibiotics today. Finally he is traveling to [place] and trauma leaving this Saturday and returning a week later and he does not travel with his pumps. He is going by car 01/30/18; patient was seen 4 days ago and brought back in today for review of cellulitis in the left leg posteriorly. I put him on amoxicillin this really hasn't helped as much as I might like. He is also worried because he is traveling to [facility] by car. Finally we will be rewrapping him. There is no open area on the right leg over his left leg has multiple weeping areas as usual 02/09/18; The same wrap on for 10 days. He did not pick up the last doxycycline I prescribed for him. He apparently took 4 days worth he already had. There is nothing open on  his right leg and the edema control is really quite good. He's had damage in the left leg medially and laterally especially probably related to the prolonged use of Unna boots 02/12/18; the patient arrived in clinic today for a nurse visit/wrap change. He complained of a lot of pain in the left posterior calf. He is taking doxycycline that I previously prescribed for him. Unfortunately even though he used his stockings and apparently used to compression pumps twice a day he has weeping edema coming out of the lateral part of his right leg. This is coming from the lower anterior lateral skin area. 02/16/18; the patient has finished his doxycycline and will finish the amoxicillin 2 days. The area of cellulitis in the left calf posteriorly has resolved. He is no longer having any pain. He tells me he is using his compression pumps at least once a day sometimes twice. 02/23/18; the patient finished his doxycycline and Amoxil last week. On Friday he noticed a small erythematous circle about the size of a quarter on the left lower leg just above his ankle. This rapidly expanded and he now has erythema on the lateral and posterior part of the thigh. This is bright red. Also has an area on the dorsal foot just above his toes and a tender area just below the left popliteal fossa. He came off his prophylactic penicillin injections at his own insistence one or 2 months ago. This is obviously deteriorated since then 03/02/18; patient is on doxycycline and Amoxil. Culture I did last week of the weeping area on the back of his left calf grew group B strep. I have therefore renewed the amoxicillin 500 3 times a day for a further week. He has not been systemically unwell. Still complaining of an area of discomfort right under his left popliteal fossa. There is no open wound on the right leg. He tells me that he is using his pumps twice a day on most days 03/09/18; patient arrives in clinic today completing his amoxicillin  today.  The cellulitis on his left leg is better. Furthermore he tells me that he had intramuscular penicillin shots that his primary care office today. However he also states that the wrap on his right leg fell down shortly after leaving clinic last week. He developed a large blister that was present when he came in for a nurse visit later in the week and then he developed intense discomfort around this area.He tells me he is using his compression pumps 03/16/18; the patient has completed his doxycycline. The infectious part of this/cellulitis in the left heel area left popliteal area is a lot better. He has 2 open areas on the right calf. Still areas on the left calf but this is a lot better as well. 03/24/18; the patient arrives complaining of pain in the left popliteal area again. He thinks some of this is wrap injury. He has no open area on the right leg and really no open area on the left calf either except for the popliteal area. He claims to be compliant with the compression pumps 03/31/18; I gave him doxycycline last week because of cellulitis in the left popliteal area. This is a lot better although the surface epithelium is denuded off and response to this. He arrives today with uncontrolled edema in the right calf area as well as a fingernail injury in the right lateral calf. There is only a few open areas on the left 04/06/18; I gave him amoxicillin doxycycline over the last 2 weeks that the amoxicillin should be completing currently. He is not complaining of any pain or systemic symptoms. The only open areas see has is on the right lateral lower leg paradoxically I cannot see anything on the left lower leg. He tells me he is using his compression pumps twice a day on most days. Silver alginate to the wounds that are open under 4 layer compression 04/13/18; he completed antibiotics and has no new complaints. Using his compression pumps. Silver alginate that anything that's opened 04/20/18; he is  using his compression pumps religiously. Silver alginate 4 layer compression anything that's opened. He comes in today with no open wounds on the left leg but 3 on the right including a new one posteriorly. He has 2 on the right lateral and one on the right posterior. He likes Unna boots on the right leg for reasons that aren't really clear we had the usual 4 layer compression on the left. It may be necessary to move to the 4 layer compression on the right however for now I left them in the Unna boots 04/27/18; he is using his compression pumps at least once a day. He has still the wounds on the right lateral calf. The area right posteriorly has closed. He does not have an open wound on the left under 4 layer compression however on the dorsal left foot just proximal to the toes and the left third toe 2 small open areas were identified 05/11/18; he has not uses compression pumps. The areas on the right lateral calf have coalesced into one large wound necrotic surface. On the left side he has one small wound anteriorly however the edema is now weeping out of a large part of his left leg. He says he wasn't using his pumps because of the weeping fluid. I explained to him that this is the time he needs to pump more 05/18/18; patient states he is using his compression pumps twice a day. The area on the right lateral large wound albeit superficial.  On the left side he has innumerable number of small new wounds on the left calf particularly laterally but several anteriorly and medially. All these appear to have healthy granulated base these look like the remnants of blisters however they occurred under compression. The patient arrives in clinic today with his legs somewhat better. There is certainly less edema, less multiple open areas on the left calf and the right anterior leg looks somewhat better as well superficial and a little smaller. However he relates pain and erythema over the last 3-4 days in the thigh  and I looked at this today. He has not been systemically unwell no fever no chills no change in blood sugar values 05/25/18; comes in today in a better state. The severe cellulitis on his left leg seems better with the Keflex. Not as tender. He has not been systemically unwell Hard to find an open wound on the left lower leg using his compression pumps twice a day The confluent wounds on his right lateral calf somewhat better looking. These will ultimately need debridement I didn't do this today. 06/01/18; the severe cellulitis on the left anterior thigh has resolved and he is completed his Keflex. There is no open wound on the left leg however there is a superficial excoriation at the base of the third toe dorsally. Skin on the bottom of his left foot is macerated looking. The left the wounds on the lateral right leg actually looks some better although he did require debridement of the top half of this wound area with an open curet 06/09/18 on evaluation today patient appears to be doing poorly in regard to his right lower extremity in particular this appears to likely be infected he has very thick purulent discharge along with a bright green tent to the discharge. This makes me concerned about the possibility of pseudomonas. He's also having increased discomfort at this point on evaluation. Fortunately there does not appear to be any evidence of infection spreading to the other location at this time. 06/16/18 on evaluation today patient appears to actually be doing fairly well. His ulcer has actually diminished in size quite significantly at this point which is good news. Nonetheless he still does have some evidence of infection he did see infectious disease this morning before coming here for his appointment. I did review the results of their evaluation and their note today. They did actually have him discontinue the Cipro and initiate treatment with linezolid at this time. He is doing this for the  next seven days and they recommended a follow-up in four months with them. He is the keep a log of the need for intermittent antibiotic therapy between now and when he falls back up with infectious disease. This will help them gaze what exactly they need to do to try and help them out. 06/23/18; the patient arrives today with no open wounds on the left leg and left third toe healed. He is been using his compression pumps twice a day. On the right lateral leg he still has a sizable wound but this is a lot better than last time I saw this. 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 Has been using silver alginate under 4 layer compression 06/30/18; the only open wound he has is on the right lateral leg and this looks healthy. No debridement is required. We have been using silver alginate. He does not  have an open wound on the left leg. There is apparently some drainage from the dorsal proximal third toe on the left although I see no open wound here. 07/03/18 on evaluation today patient was actually here just for a nurse visit rapid change. However when he was here on Wednesday for his rat change due to having been healed on the left and then developing blisters we initiated the wrap again knowing that he would be back today for [place] to reevaluate and see were at. Unfortunately he has developed some cellulitis into the proximal portion of his right lower extremity even into the region of his thigh. 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 He was placed on one as what at that point. Nonetheless he is done with that and has been tolerating it well otherwise. Doxycycline which in the past really did not seem to be effective for him. Nonetheless I think the best option may be for [place] to definitely reinitiate the antibiotics for a longer period of time. 07/07/18; since I  last saw this patient a week ago he has had a difficult time. At that point he did not have an open wound on his left leg. We transitioned him into juxta light stockings. He was apparently in the clinic the next day with blisters on the left lateral and left medial lower calf. He also had weeping edema fluid. He was put back into a compression wrap. He was also in the clinic on Friday with intense erythema in his right thigh. Per the patient he was started on Bactrim however that didn't work at all in terms of relieving his pain and swelling. He has taken 3 doxycycline that he had left over from last time and that seems to of helped. He has blistering on the right thigh as well. 07/14/18; the erythema on his right thigh has gotten better with doxycycline that he is finishing. 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 Presumably this cellulitis in the thigh was not related to that although I think this is a bit concerning going forward. He still has an area on the right lateral calf the blister on the right medial calf just below the knee that was discussed above. On the left 2 small open areas left medial and left lateral. Edema control is adequate. He is using his compression pumps twice a day 07/20/18; continued improvement in the condition of both legs especially the edema in his bilateral thighs. He tells me he is been losing weight through a combination of diet and exercise. He is using his compression pumps twice a day. So overall she made to the remaining wounds 07/27/2018; continued improvement in condition of both legs. His edema is well controlled. The area on the right lateral leg is just about closed he had one blisters show up on the medial left upper calf. We have him in 4 layer compression. He is going on a 10-day trip to [place]. He will be driving. He wants to wear Unna boots because of  the lessening amount of constriction. He will not use compression pumps while he is away 08/05/18 on evaluation today patient actually appears to be doing decently well all things considered in regard to his bilateral lower extremities. The worst ulcer is actually only posterior aspect of his left lower extremity with a four  layer compression wrap cut into his leg a couple weeks back. He did have a trip and actually had [employer] for the trip that he is worn since he was last here. Nonetheless he feels like the [employer] actually do better for him his swelling is up a little bit but he also with his trip was not taking his Lasix on a regular set schedule like he was supposed to be. He states that obviously the reason being that he cannot drive and keep going without having to urinate too frequently which makes it difficult. He did not have his pumps with him while he was away either which I think also maybe playing a role here too. 08/13/2018; the patient only has a small open wound on the right lateral calf which is a big improvement in the last month or 2. He also has the area posteriorly just below the posterior fossa on the left which I think was a wrap injury from several weeks ago. He has no current evidence of cellulitis. He tells me he is back into his compression pumps twice a day. He also tells me that while he was at the laundromat somebody stole a section of his extremitease stockings 08/20/2018; back in the clinic with a much improved state. He only has small areas on the right lateral mid calf which is just about healed. This was is more substantial area for quite a prolonged period of time. He has a small open area on the left anterior tibia. The area on the posterior calf just below the popliteal fossa is closed today. He is using his compression pumps twice a day 08/28/2018; patient has no open wound on the right leg. He has a smattering of open areas on the calf with some  weeping lymphedema. More problematically than that it looks as though his wraps of slipped down in his usual he has very angry upper area of edema just below the right medial knee and on the right lateral calf. He has no open area on his feet. The patient is traveling to [facility] next week. I will send him in an antibiotic. We will continue to wrap the right leg. We ordered extremitease stockings for him last week and I plan to transition the right leg to a stocking when he gets home which will be in 10 days time. As usual he is very reluctant to take his pumps with him when he travels 09/07/2018; patient returns from [facility]. He shows me a picture of his left leg in the mid part of his trip last week with intense fire engine erythema. The picture look bad enough I would have considered sending him to the hospital. Instead he went to the wound care center in [facility]. They did not prescribe him antibiotics but he did take some doxycycline he had leftover from a previous visit. I had given him trimethoprim sulfamethoxazole before he left this did not work according to the patient. This is resulted in some improvement fortunately. He comes back with a large wound on the left posterior calf. Smaller area on the left anterior tibia. Denuded blisters on the dorsal left foot over his toes. Does not have much in the way of wounds on the right leg although he does have a very tender area on the right posterior area just below the popliteal fossa also suggestive of infection. He promises me he is back on his pumps twice a day 09/15/2018; the intense  cellulitis in his left lower calf is a lot better. The wound area on the posterior left calf is also so better. However he has reasonably extensive wounds on the dorsal aspect of his second and third toes and the proximal foot just at the base of the toes. There is nothing open on the right leg 09/22/2018; the patient has excellent edema  control in his legs bilaterally. He is using his external compression pumps twice a day. He has no open area on the right leg and only the areas in the left foot dorsally second and third toe area on the left side. He does not have any signs of active cellulitis. 10/06/2018; the patient has good edema control bilaterally. He has no open wound on the right leg. There is a blister in the posterior aspect of his left calf that we had to deal with today. He is using his compression pumps twice a day. There is no signs of active cellulitis. We have been using silver alginate to the wound areas. He still has vulnerable areas on the base of his left first second toes dorsally He has a his extremities stockings and we are going to transition him today into the stocking on the right leg. He is cautioned that he will need to continue to use the compression pumps twice a day. If he notices uncontrolled edema in the right leg he may need to go to 3 times a day. 10/13/2018; the patient came in for a nurse check on Friday he has a large flaccid blister on the right medial calf just below the knee. We unroofed this. He has this and a new area underneath the posterior mid calf which was undoubtedly a blister as well. He also has several small areas on the right which is the area we put his extremities stocking on. 10/19/2018; the patient went to see infectious disease this morning I am not sure if that was a routine follow-up in any case the doxycycline I had given him was discontinued and started on linezolid. He has not started this. It is easy to look at his left calf and the inflammation and think this is cellulitis however he is very tender in the tissue just below the popliteal fossa and I have no doubt that there is infection going on here. He states the problem he is having is that with the compression pumps the edema goes down and then starts walking the wrap falls down. We will see if we can adhere this. He  has 1 or 2 minuscule open areas on the right still areas that are weeping on the posterior left calf, the base of his left second and third toes 10/26/18; back today in clinic with quite of skin breakdown in his left anterior leg. This may have been infection the area below the popliteal fossa seems a lot better however tremendous epithelial loss on the left anterior mid tibia area over quite inexpensive tissue. He has 2 blisters on the right side but no other open wound here. 10/29/2018; came in urgently to see [place] today and we worked him in for review. He states that the 4 layer compression on the right leg caused pain he had to cut it down to roughly his mid calf this caused swelling above the wrap and he has blisters and skin breakdown today. As a result of the pain he has not been using his pumps. Both legs are a lot more edematous and there is a lot of  weeping fluid. 11/02/18; arrives in clinic with continued difficulties in the right leg> left. Leg is swollen and painful. multiple skin blisters and new open areas especially laterally. He has not been using his pumps on the right leg. 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 He is not systemically unwell. 11/09/2018; the patient claims he is being compliant with his pumps. He is finished the doxycycline I gave him last week. Culture I did of the wound on the right lateral leg showed a few very resistant methicillin staph aureus. This was resistant to doxycycline. Nevertheless he states the pain in the leg is a lot better which makes me wonder if the cultured organism was not really what was causing the problem nevertheless this is a very dangerous organism to be culturing out of any wound. His right leg is still a lot larger than the left. He is using an Radio broadcast assistant on this area, he blames a 4-layer compression for causing the original skin breakdown which I doubt is true however I cannot talk him out  of it. We have been using silver alginate to all of these areas which were initially blisters 11/16/2018; patient is being compliant with his external compression pumps at twice a day. Miraculously he arrives in clinic today with absolutely no open wounds. He has better edema control on the left where he has been using 4 layer compression versus wound of wounds on the right and I pointed this out to him. There is no inflammation in the skin in his lower legs which is also somewhat unusual for him. There is no open wounds on the dorsal left foot. He has extremitease stockings at home and I have asked him to bring these in next week. 11/25/18 patient's lower extremity on examination today on the left appears for the most part to be wound free. He does have an open wound on the lateral aspect of the right lower extremity but this is minimal compared to what I've seen in past. He does request that we go ahead and wrap the left leg as well even though there's nothing open just so hopefully it will not reopen in short order. 1/28; patient has superficial open wounds on the right lateral calf left anterior calf and left posterior calf. His edema control is adequate. 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 We have been using silver alginate as the primary dressing. He claims compliance with his compression pumps 2/4; patient has superficial open wounds on numerous areas of his left calf and again one on the left dorsal foot. The areas on the right lateral calf have healed. The cellulitis that I gave him doxycycline for last week is also resolved this was mostly on the left anterior calf just below the tibial tuberosity. His edema looks fairly well-controlled. He tells me he went to see his primary doctor today and had blood work ordered 2/11; once again he has several open areas on the left calf left tibial area. Most of  these are small and appear to have healthy granulation. He does not have anything open on the right. The edema and control in his thighs is pretty good which is usually a good indication he has been using his pumps as requested. 2/18; he continues to have several small areas on the left calf and left tibial area. Most of these are small healthy  granulation. We put him in his stocking on the right leg last week and he arrives with a superficial open area over the right upper tibia and a fairly large area on the right lateral tibia in similar condition. His edema control actually does not look too bad, he claims to be using his compression pumps twice a day 2/25. Continued small areas on the left calf and left tibial area. New areas especially on the right are identified just below the tibial tuberosity and on the right upper tibia itself. There are also areas of weeping edema fluid even without an obvious wound. He does not have a considerable degree of lymphedema but clearly there is more edema here than his skin can handle. He states he is using the pumps twice a day. We have an Unna boot on the right and 4 layer compression on the left. 3/3; he continues to have an area on the right lateral calf and right posterior calf just below the popliteal fossa. There is a fair amount of tenderness around the wound on the popliteal fossa but I did not see any evidence of cellulitis, could just be that the wrap came down and rubbed in this area. He does not have an open area on the left leg however there is an area on the left dorsal foot at the base of the third toe We have been using silver alginate to all wound areas 3/10; he did not have an open area on his left leg last time he was here a week ago. T oday he arrives with a horizontal wound just below the tibial tuberosity and an area on the left lateral calf. He has intense erythema and tenderness in this area. The area is on the right lateral calf and  right posterior calf better than last week. We have been using silver alginate as usual 3/18 - Patient returns with 3 small open areas on left calf, and 1 small open area on right calf, the skin looks ok with no significant erythema, he continues the UNA boot on right and 4 layer compression on left. The right lateral calf wound is closed , the right posterior is small area. we will continue silver alginate to the areas. 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 He is been tolerating the dressing changes without complication. Fortunately she has made excellent improvement in regard to the overall status of his wounds. Unfortunately every time we cease wrapping him he ends up reopening in causing more significant issues at that point. Again I'm unsure of the best direction to take although I think the lymphedema clinic may be appropriate for him. 02/03/19 on evaluation today patient appears to be doing well in regard to the wounds that we saw him for last week unfortunately he has a new area on the proximal portion of his right medial/posterior lower extremity where the wrap somewhat slowed down and caused swelling and a blister to rub and open. Unfortunately this is the only opening that he has on either leg at this point. 02/17/19 on evaluation today patient's bilateral lower extremities appear to be doing well. He still completely healed in regard to the left lower extremity. In regard to the right lower extremity the area where the wrap and slid down and caused the blister still seems to  be slightly open although this is dramatically better than during the last evaluation two weeks ago. I'm very pleased with the way this stands overall. 03/03/19 on evaluation today patient appears to be doing well in regard  to his right lower extremity in general although he did have a new blister open this does not appear to be showing any evidence of active infection at this time. Fortunately there's No fevers, chills, nausea, or vomiting noted at this time. Overall I feel like he is making good progress it does feel like that the right leg will we perform the [employer] seems to do with a bit better than three layer wrap on the left which slid down on him. We may switch to doing bilateral in the book wraps. 5/4; I have not seen Mr. Guilfoil in quite some time. According to our case manager he did not have an open wound on his left leg last week. He had 1 remaining wound on the right posterior medial calf. He arrives today with multiple openings on the left leg probably were blisters and/or wrap injuries from Unna boots. I do not think the Unna boot's will provide adequate compression on the left. I am also not clear about the frequency he is using the compression pumps. 03/17/19 on evaluation today patient appears to be doing excellent in regard to his lower extremities compared to last week's evaluation apparently. He had gotten significantly worse last week which is unfortunate. The [employer] wrap on the left did not seem to do very well for him at all and in fact it didn't control his swelling significantly enough he had an additional outbreak. Subsequently we go back to the four layer compression wrap on the left. This is good news. At least in that he is doing better and the wound seem to be killing him. He still has not heard anything from the lymphedema clinic. 03/24/19 on evaluation today patient actually appears to be doing much better in regard to his bilateral lower Trinity as compared to last week when I saw him. Fortunately there's no signs of active infection at this time. He has been tolerating the dressing changes without complication. Overall I'm extremely pleased with the progress and appearance in  general. 04/07/19 on evaluation today patient appears to be doing well in regard to his bilateral lower extremities. His swelling is significantly down from where it was previous. With that being said he does have a couple blisters still open at this point but fortunately nothing that seems to be too severe and again the majority of the larger openings has healed at this time. 04/14/19 on evaluation today patient actually appears to be doing quite well in regard to his bilateral lower extremities in fact I'm not even sure there's anything significantly open at this time at any site. Nonetheless he did have some trouble with these wraps where they are somewhat irritating him secondary to the fact that he has noted that the graph wasn't too close down to the end of this foot in a little bit short as well up to his knee. Otherwise things seem to be doing quite well. 04/21/19 upon evaluation today patient's wound bed actually showed evidence of being completely healed in regard to both lower extremities which is excellent news. There does not appear to be any signs of active infection which is also good news. I'm very pleased in this regard. No fevers, chills, nausea, or vomiting noted at this time. 04/28/19 on evaluation today  patient appears to be doing a little bit worse in regard to both lower extremities on the left mainly due to the fact that when he went infection disease the wrap was not wrapped quite high enough he developed a blister above this. On the right he is a small open area of nothing too significant but again this is continuing to give him some trouble he has been were in the Velcro compression that he has at home. 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 He's been tolerating the dressing changes without complication. Fortunately there's no signs of active infection at this time. No fevers, chills, nausea, or vomiting noted at  this time. We have been trying to get an appointment with her lymphedema clinic in [facility] but unfortunately nobody can get them on phone with not been able to even fax information over the patient likewise is not been able to get in touch with them. Overall I'm not sure exactly what's going on here with to reach out again today. 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 Still having a lot of drainage unfortunately. He tells me especially in the left but even on the right. There's no signs of active infection which is good news we've been using so ratcheted up to this point. 05/19/19 on evaluation today patient actually appears to be doing quite well with regard to his left lower extremity which is great news. Fortunately in regard to the right lower extremity has an issues with his wrap and he subsequently did remove this from what I'm understanding. Nonetheless long story short is what he had rewrapped once he removed it subsequently had maggots underneath this wrap whenever he came in for evaluation today. With that being said they were obviously completely cleaned away by the nursing staff. The visit today which is excellent news. However he does appear to potentially have some infection around the right ankle region where the maggots were located as well. He will likely require anabiotic therapy today. 05/26/19 on evaluation today patient actually appears to be doing much better in regard to his bilateral lower extremities. I feel like the infection is under much better control. With that being said there were maggots noted when the wrap was removed yet again today. Again this could have potentially been left over from previous although at this time there does not appear to be any signs of significant drainage there was obviously on the wrap some drainage as well this contracted gnats or  otherwise. Either way I do not see anything that appears to be doing worse in my pinion and in fact I think his drainage has slowed down quite significantly likely mainly due to the fact to his infection being under better control. 06/02/2019 on evaluation today patient actually appears to be doing well with regard to his bilateral lower extremities there is no signs of active infection at this time which is great news. With that being said he does have several open areas more so on the right than the left but nonetheless these are all significantly better than previously noted. 06/09/2019 on evaluation today patient actually appears to be doing well. His wrap stayed up and he did not cause any problems he had more drainage on the right compared to the left but overall I do not see any major issues at this time which  is great news. 06/16/2019 on evaluation today patient appears to be doing excellent with regard to his lower extremities the only area that is open is a new blister that can have opened as of today on the medial ankle on the left. Other than this he really seems to be doing great I see no major issues at this point. 06/23/2019 on evaluation today patient appears to be doing quite well with regard to his bilateral lower extremities. In fact he actually appears to be almost completely healed there is a small area of weeping noted of the right lower extremity just above the ankle. Nonetheless fortunately there is no signs of active infection at this time which is good news. No fevers, chills, nausea, vomiting, or diarrhea. 8/24; the patient arrived for a nurse visit today but complained of very significant pain in the left leg and therefore I was asked to look at this. Noted that he did not have an open area on the left leg last week nevertheless this was wrapped. The patient states that he is not been able to put his compression pumps on the left leg because of the discomfort. He has not been  systemically unwell 06/30/2019 on evaluation today patient unfortunately despite being excellent last week is doing much worse with regard to his left lower extremity today. In fact he had to come in for a nurse on Monday where his left leg had to be rewrapped due to excessive weeping Dr. Leanord Hawking placed him on doxycycline at that point. Fortunately there is no signs of active infection Systemically at this time which is good news. 07/07/2019 in regard to the patient's wounds today he actually seems to be doing well with his right lower extremity there really is nothing open or draining at this point this is great news. Unfortunately the left lower extremity is given him additional trouble at this time. There does not appear to be any signs of active infection nonetheless he does have a lot of edema and swelling noted at this point as well as blistering all of which has led to a much more poor appearing leg at this time compared to where it was 2 weeks ago when it was almost completely healed. Obviously this is a little discouraging for the patient. He is try to contact the lymphedema clinic in [place] he has not been able to get through to them. 07/14/2019 on evaluation today patient actually appears to be doing slightly better with regard to his left lower extremity ulcers. Overall I do feel like at least at the top of the wrap that we have been placing this area has healed quite nicely and looks much better. The remainder of the leg is showing signs of improvement. Unfortunately in the thigh area he still has an open region on the left and again on the right he has been utilizing just a Band-Aid on an area that also opened on the thigh. Again this is an area that were not able to wrap although we did do an Ace wrap to provide some compression that something that obviously is a little less effective than the compression wraps we have been using on the lower portion of the leg. He does have an  appointment with the lymphedema clinic in [facility] on Friday. 07/21/2019 on evaluation today patient appears to be doing better with regard to his lower extremity ulcers. He has been tolerating the dressing changes without complication. Fortunately there is no signs of active infection at this  time. No fevers, chills, nausea, vomiting, or diarrhea. I did receive the paperwork from the physical therapist at the lymphedema clinic in [place]. Subsequently I signed off on that this morning and sent that back to him for further progression with the treatment plan. 07/28/2019 on evaluation today patient appears to be doing very well with regard to his right lower extremity where I do not see any open wounds at this point. Fortunately he is feeling great as far as that is concerned as well. In regard to the left lower extremity he has been having issues with still several areas of weeping and edema although the upper leg is doing better his lower leg still I think is going require the compression wrap at this time. No fevers, chills, nausea, vomiting, or diarrhea. 08/04/2019 on evaluation today patient unfortunately is having new wounds on the right lower extremity. Again we have been using Unna boot wrap on that side. We switched him to using his juxta lite wrap at home. With that being said he tells me he has been using it although his legs extremely swollen and to be honest really does not appear that he has been. I cannot know that for sure however. Nonetheless he has multiple new wounds on the right lower extremity at this time. Obviously we will have to see about getting this rewrapped for him today. 08/11/2019 on evaluation today patient appears to be doing fairly well with regard to his wounds. He has been tolerating the dressing changes including the compression wraps without complication. He still has a lot of edema in his upper thigh regions bilaterally he is supposed to be seeing the  lymphedema clinic on the 15th of this month once his wraps arrive for the upper part of his legs. 08/18/2019 on evaluation today patient appears to be doing well with regard to his bilateral lower extremities at this point. He has been tolerating the dressing changes without complication. Fortunately there is no signs of active infection which is also good news. He does have a couple weeping areas on the first and second toe of the right foot he also has just a small area on the left foot upper leg and a small area on the left lower leg but overall he is doing quite well in my opinion. He is supposed to be getting his wraps shortly in fact tomorrow and then subsequently is seeing the lymphedema clinic next Wednesday on the 21st. Of note he is also leaving on the 25th to go on vacation for a week to the beach. For that reason and since there is some uncertainty about what there can be doing at lymphedema clinic next Wednesday I am get a make an appointment for next Friday here for [place] to see what we need to do for him prior to him leaving for vacation. 10/23; patient arrives in considerable pain predominantly in the upper posterior calf just distal to the popliteal fossa also in the wound anteriorly above the major wound. This is probably cellulitis and he has had this recurrently in the past. He has no open wound on the right side and he has had an Radio broadcast assistant in that area. Finally I note that he has an area on the left posterior calf which by enlarge is mostly epithelialized. This protrudes beyond the borders of the surrounding skin in the setting of dry scaly skin and lymphedema. The patient is leaving for [facility] on Sunday. Per his longstanding pattern, he will not take  his compression pumps with him predominantly out of fear that they will be stolen. He therefore asked that we put a Unna boot back on the right leg. He will also contact the wound care center in [facility] to see if they can  change his dressing in the mid week. 11/3; patient returned from his vacation to [facility]. He was seen on 1 occasion at their wound care center. They did a 2 layer compression system as they did not have our 4-layer wrap. I am not completely certain what they put on the wounds. They did not change the Unna boot on the right. The patient is also seeing a lymphedema specialist physical therapist in [place]. It appears that he has some compression sleeve for his thighs which indeed look quite a bit better than I am used to seeing. He pumps over these with his external compression pumps. 11/10; the patient has a new wound on the right medial thigh otherwise there is no open areas on the right. He has an area on the left leg posteriorly anteriorly and medially and an area over the left second toe. We have been using silver alginate. He thinks the injury on his thigh is secondary to friction from the compression sleeve he has. 11/17; the patient has a new wound on the right medial thigh last week. He thinks this is because he did not have a underlying stocking for his thigh juxta lite apparatus. He now has this. The area is fairly large and somewhat angry but I do not think he has underlying cellulitis. He has a intact blister on the right anterior tibial area. Small wound on the right great toe dorsally Small area on the medial left calf. 11/30; the patient does not have any open areas on his right leg and we did not take his juxta lite stocking off. However he states that on Friday his compression wrap fell down lodging around his upper mid calf area. As usual this creates a lot of problems for him. He called urgently today to be seen for a nurse visit however the nurse visit turned into a provider visit because of extreme erythema and pain in the left anterior tibia extending laterally and posteriorly. The area that is problematic is extensive 10/06/2019 upon evaluation today patient actually  appears to be doing poorly in regard to his left lower extremity. He Dr. Leanord Hawking did place him on doxycycline this past Monday apparently due to the fact that he was doing much worse in regard to this left leg. Fortunately the doxycycline does seem to be helping. Unfortunately we are still having a very difficult time getting his edema under any type of control in order to anticipate discharge at some point. The only way were really able to control his lymphedema really is with compression wraps and that has only even seemingly temporary. He has been seeing a lymphedema clinic they are trying to help in this regard but still this has been somewhat frustrating in general for the patient. 10/13/19 on evaluation today patient appears to be doing excellent with regard to his right lower extremity as far as the wounds are concerned. His swelling is still quite extensive unfortunately. He is still having a lot of drainage from the thigh areas bilaterally which is unfortunate. He's been going to lymphedema clinic but again he still really does not have this edema under control as far as his lower extremities are concern. With regard to his left lower extremity this seems  to be improving and I do believe the doxycycline has been of benefit for him. He is about to complete the doxycycline. 10/20/2019 on evaluation today patient appears to be doing poorly in regard to his bilateral lower extremities. More in the right thigh he has a lot of irritation at this site unfortunately. In regard to the left lower extremity the wrap was not quite as high it appears and does seem to have caused him some trouble as well. Fortunately there is no evidence of systemic infection though he does have some blue-green drainage which has me concerned for the possibility of Pseudomonas. He tells me he is previously taking Cipro without complications and he really does not care for Levaquin however due to some of the side effects he  has. He is not allergic to any medications specifically antibiotics that were aware of. 10/27/2019 on evaluation today patient actually does appear to be for the most part doing better when compared to last week's evaluation. With that being said he still has multiple open wounds over the bilateral lower extremities. He actually forgot to start taking the Cipro and states that he still has the whole bottle. He does have several new blisters on left lower extremity today I think I would recommend he go ahead and take the Cipro based on what I am seeing at this point. 12/30-Patient comes at 1 week visit, 4 layer compression wraps on the left and Unna boot on the right, primary dressing Xtrasorb and silver alginate. Patient is taking his Cipro and has a few more days left probably 5-6, and the legs are doing better. He states he is using his compressions devices which I believe he has 11/10/2019 on evaluation today patient actually appears to be much better than last time I saw him 2 weeks ago. His wounds are significantly improved and overall I am very pleased in this regard. Fortunately there is no signs of active infection at this time. He is just a couple of days away from completing Cipro. Overall his edema is much better he has been using his lymphedema pumps which I think is also helping at this point. 11/17/2019 on evaluation today patient appears to be doing excellent in regard to his wounds in general. His legs are swollen but not nearly as much as they have been in the past. Fortunately he is tolerating the compression wraps without complication. No fevers, chills, nausea, vomiting, or diarrhea. He does have some erythema however in the distal portion of his right lower extremity specifically around the forefoot and toes there is a little bit of warmth here as well. 11/24/2019 on evaluation today patient appears to be doing well with regard to his right lower extremity I really do not see any open  wounds at this point. His left lower extremity does have several open areas and his right medial thigh also is open. Other than this however overall the patient seems to be making good progress and I am very pleased at this point. 12/01/2019 on evaluation today patient appears to be doing poorly at this point in regard to his left lower extremity has several new blisters despite the fact that we have him in compression wraps. In fact he had a 4-layer compression wrap, his upper thigh wrapped from lymphedema clinic, and a juxta light over top of the 4 layer compression wrap the lymphedema clinic applied and despite all this he still develop blisters underneath. Obviously this does have me concerned about the fact that  unfortunately despite what we are doing to try to get wounds healed he continues to have new areas arise I do not think he is ever good to be at the point where he can realistically just use wraps at home to keep things under control. Typically when we heal him it takes about 1-2 days before he is back in the clinic with severe breakdown and blistering of his lower extremities bilaterally. This is happened numerous times in the past. Unfortunately I think that we may need some help as far as overall fluid overload to kind of limit what we are seeing and get things under better control. 12/08/2019 on evaluation today patient presents for follow-up concerning his ongoing bilateral lower extremity edema. Unfortunately he is still having quite a bit of swelling the compression wraps are controlling this to some degree but he did see Dr. [name] his cardiologist I do have that available for review today as far as the appointment was concerned that was on 12/06/2019. Obviously that she has been 2 days ago. The patient states that he is only been taking the Lasix 80 mg 1 time a day he had told me previously he was taking this twice a day. Nonetheless Dr. [name] recommended this be up to 80 mg 2 times a  day for the patient as he did appear to be fluid overloaded. With that being said the patient states he did this yesterday and he was unable to go anywhere or do anything due to the fact that he was constantly having to urinate. Nonetheless I think that this is still good to be something that is important for him as far as trying to get his edema under control at all things that he is going to be able to just expect his wounds to get under control and things to be better without going through at least a period of time where he is trying to stabilize his fluid management in general and I think increasing the Lasix is likely the first step here. It was also mentioned the possibility that the patient may require metolazone. With that being said he wanted to have the patient take Lasix twice a day first and then reevaluating 2 months to see where things stand. 12/15/2019 upon evaluation today patient appears to be doing regard to his legs although his toes are showing some signs of weeping especially on the left at this point to some degree on the right. There does not appear to be any signs of active infection and overall I do feel like the compression wraps are doing well for him but he has not been able to take the Lasix at home and the increased dose that Dr. [name] recommended. He tells me that just not go to be feasible for him. Nonetheless I think in this case he should probably send a message to Dr. [name] in order to discuss options from the standpoint of possible admission to get the fluid off or otherwise going forward. 12/22/2019 upon evaluation today patient appears to be doing fairly well with regard to his lower extremities at this point. In fact he would be doing excellent if it was not for the fact that his right anterior thigh apparently had an allergic reaction to adhesive tape that he used. The wound itself that we have been monitoring actually appears to be healed. There is a lot of  irritation at this point. 12/29/2019 upon evaluation today patient appears to be doing well in regard to his  lower extremities. His left medial thigh is open and somewhat draining today but this is the only region that is open the right has done much better with the treatment utilizing the steroid cream that I prescribed for him last week. Overall I am pleased in that regard. Fortunately there is no signs of active infection at this time. No fevers, chills, nausea, vomiting, or diarrhea. 01/05/2020 upon evaluation today patient appears to be doing more poorly in regard to his right lower extremity at this point upon evaluation today. Unfortunately he continues to have issues in this regard and I think the biggest issue is controlling his edema. This obviously is not very well controlled at this point is been recommended that he use the Lasix twice a day but he has not been able to do that. Unfortunately I think this is leading to an issue where honestly he is not really able to effectively control his edema and therefore the wounds really are not doing significantly better. I do not think that he is going to be able to keep things under good control unless he is able to control his edema much better. I discussed this again in great detail with him today. 01/12/2020 good news is patient actually appears to be doing quite well today at this point. He does have an appointment with lymphedema clinic tomorrow. His legs appear healed and the toe on the left is almost completely healed. In general I am very pleased with how things stand at this point. 01/19/2020 upon evaluation today patient appears to actually be doing well in regard to his lower extremities there is nothing open at this point. Fortunately he has done extremely well more recently. Has been seeing lymphedema clinic as well. With that being said he has Velcro wraps for his lower legs as well as his upper legs. The only wound really is on his toe  which is the right great toe and this is barely anything even there. With all that being said I think it is good to be appropriate today to go ahead and switch him over to the Velcro compression wraps. 01/26/2020 upon evaluation today patient appears to be doing worse with regard to his lower extremities after last week switch him to Velcro compression wraps. Unfortunately he lasted less than 24 hours he did not have the sock portion of his Velcro wrap on the left leg and subsequently developed a blister underneath the Velcro portion. Obviously this is not good and not what we were looking for at this point. He states the lymphedema clinic did tell him to wear the wrap for 23 hours and take him off for 1 I am okay with that plan but again right now we got a get things back under control again he may have some cellulitis noted as well. 02/02/2020 upon evaluation today patient unfortunately appears to have several areas of blistering on his bilateral lower extremities today mainly on the feet. His legs do seem to be doing somewhat better which is good news. Fortunately there is no evidence of active infection at this time. No fevers, chills, nausea, vomiting, or diarrhea. 02/16/2020 upon evaluation today patient appears to be doing well at this time with regard to his legs. He has a couple weeping areas on his toes but for the most part everything is doing better and does appear to be sealed up on his legs which is excellent news. We can continue with wrapping him at this point as he had every  time we discontinue the wraps he just breaks out with new wounds. There is really no point in is going forward with this at this point. 03/08/2020 upon evaluation today patient actually appears to be doing quite well with regard to his lower extremity ulcers. He has just a very superficial and really almost nonexistent blister on the left lower extremity he has in general done very well with the compression wraps. With  that being said I do not see any signs of infection at this time which is good news. 03/29/2020 upon evaluation today patient appears to be doing well with regard to his wounds currently except for where he had several new areas that opened up due to some of the wrap slipping and causing him trouble. He states he did not realize they had slipped. Nonetheless he has a 1 area on the right and 3 new areas on the left. Fortunately there is no signs of active infection at this time which is great news. 04/05/2020 upon evaluation today patient actually appears to be doing quite well in general in regard to his legs currently. Fortunately there is no signs of active infection at this time. No fevers, chills, nausea, vomiting, or diarrhea. He tells me next week that he will actually be seen in the lymphedema clinic on Thursday at 10 AM I see him on Wednesday next week. 04/12/2020 upon evaluation today patient appears to be doing very well with regard to his lower extremities bilaterally. In fact he does not appear to have any open wounds at this point which is good news. Fortunately there is no signs of active infection at this time. No fevers, chills, nausea, vomiting, or diarrhea. 04/19/2020 upon evaluation today patient appears to be doing well with regard to his wounds currently on the bilateral lower extremities. There does not appear to be any signs of active infection at this time. Fortunately there is no evidence of systemic infection and overall very pleased at this point. Nonetheless after I held him out last week he literally had blisters the next morning already which swelled up with him being right back here in the clinic. Overall I think that he is just not can be able to be discharged with his legs the way they are he is much to volume overloaded as far as fluid is concerned and that was discussed with him today of also discussed this but should try the clinic nurse manager as well as Dr.  Leanord Hawking. 04/26/2020 upon evaluation today patient appears to be doing better with regard to his wounds currently. He is making some progress and overall swelling is under good control with the compression wraps. Fortunately there is no evidence of active infection at this time. 05/10/2020 on evaluation today patient appears to be doing overall well in regard to his lower extremities bilaterally. He is Tolerating the compression wraps without complication and with what we are seeing currently I feel like that he is making excellent progress. There is no signs of active infection at this time. 05/24/2020 upon evaluation today patient appears to be doing well in regard to his legs. The swelling is actually quite a bit down compared to where it has been in the past. Fortunately there is no sign of active infection at this time which is also good news. With that being said he does have several wounds on his toes that have opened up at this point. 05/31/2020 upon evaluation today patient appears to be doing well with regard to his  legs bilaterally where he really has no significant fluid buildup at this point overall he seems to be doing quite well. Very pleased in this regard. With regard to his toes these also seem to be drying up which is excellent. We have continue to wrap him as every time we tried as a transition to the juxta light wraps things just do not seem to get any better. 06/07/2020 upon evaluation today patient appears to be doing well with regard to his right leg at this point. Unfortunately left leg has a lot of blistering he tells me the wrap started to slide down on him when he tried to put his other Velcro wrap over top of it to help keep things in order but nonetheless still had some issues. 06/14/2020 on evaluation today patient appears to be doing well with regard to his lower extremity ulcers and foot ulcers at this point. I feel like everything is actually showing signs of improvement which  is great news overall there is no signs of active infection at this time. No fevers, chills, nausea, vomiting, or diarrhea. 06/21/2020 on evaluation today patient actually appears to be doing okay in regard to his wounds in general. With that being said the biggest issue I see is on his right foot in particular the first and second toe seem to be doing a little worse due to the fact this is staying very wet. I think he is probably getting need to change out his dressings a couple times in between each week when we see him in regard to his toes in order to keep this drier based on the location and how this is proceeding. 06/28/2020 on evaluation today patient appears to be doing a little bit more poorly overall in regard to the appearance of the skin I am actually somewhat concerned about the possibility of him having a little bit of an infection here. We discussed the course of potentially giving him a doxycycline prescription which he is taken previously with good result. With that being said I do believe that this is potentially mild and at this point easily fixed. I just do not want anything to get any worse. 07/12/2020 upon evaluation today patient actually appears to be making some progress with regard to his legs which is great news there does not appear to be any evidence of active infection. Overall very pleased with where things stand. 07/26/2020 upon evaluation today patient appears to be doing well with regard to his leg ulcers and toe ulcers at this point. He has been tolerating the compression wraps without complication overall very pleased in this regard. 08/09/2020 upon evaluation today patient appears to be doing well with regard to his lower extremities bilaterally. Fortunately there is no signs of active infection overall I am pleased with where things stand. 08/23/2020 on evaluation today patient appears to be doing well with regard to his wound. He has been tolerating the dressing  changes without complication. Fortunately there is no signs of active infection at this time. Overall his legs seem to be doing quite well which is great news and I am very pleased in that regard. No fevers, chills, nausea, vomiting, or diarrhea. 09/13/2020 upon evaluation today patient appears to be doing okay in regard to his lower extremities. He does have a fairly large blister on the right leg which I did remove the blister tissue from today so we can get this to dry out other than that however he seems to be doing  quite well. There is no signs of active infection at this time. 09/27/2020 upon evaluation today patient appears to actually be doing some better in regard to his right leg. Fortunately signs of active infection at this time which is great news. No fevers, chills, nausea, vomiting, or diarrhea. 10/04/2020 upon evaluation today patient actually appears to be showing signs of improvement which is great news with regard to his leg ulcers. Fortunately there is no signs of active infection which is great news he is still taking the antibiotics currently. No fevers, chills, nausea, vomiting, or diarrhea. 10/18/2020 on evaluation today patient appears to be doing well with regard to his legs currently. He has been tolerating the dressing changes including the wraps without complication. Fortunately there is no signs of active infection at this time. No fevers, chills, nausea, vomiting, or diarrhea. 10/25/2020 upon evaluation today patient appears to be doing decently well in regard to his wounds currently. He has been tolerating the dressing changes without complication. Overall I feel like he is making good progress albeit slow. Again this is something we can have to continue to wrap for some time to come most likely. 11/08/2020 upon evaluation today patient appears to be doing well with regard to his wounds currently. He has been tolerating the dressing changes without complication is not  currently on any antibiotics and he does not appear to show any signs of infection. He does continue to have a lot of drainage on the right leg not too severe but nonetheless this is very scattered. On the left leg this is looking to be much improved overall. 11/15/2020 upon evaluation today patient appears to be doing better with regard to his legs bilaterally. Especially the right leg which was much more significant last week. There does not appear to be any signs of active infection which is great news. No fevers, chills, nausea, vomiting, or diarrhea. 11/23/2019 upon evaluation today patient appears to be doing poorly still in regard to his lower extremities bilaterally. Unfortunately his right leg in particular appears to be doing much more poorly there is no signs really of infection this is not warm to touch but he does have a lot of drainage and weeping unfortunately. With that reason I do believe that we may need to initiate some treatment here to try to help calm down some of the swelling of the right leg. I think switching to a 4-layer compression wrap would be beneficial here. The patient is in agreement with giving this a try. 11/29/2020 upon evaluation today patient appears to be doing well currently in regard to his leg ulcers. I feel like the right leg is doing better he still has a lot of drainage but we do see some improvement here. The 4-layer compression wrap I think was helpful. 12/06/2020 upon evaluation today patient appears to be doing well with regard to his legs. In fact they seem to be doing about the best I have seen up to this point. Fortunately there is no signs of active infection at this time. No fevers, chills, nausea, vomiting, or diarrhea. 12/20/2020 upon evaluation today patient appears to be doing well at this time in regard to his legs. He is not having any significant draining which is great news. Fortunately there is no signs of active infection at this time. No fevers,  chills, nausea, vomiting, or diarrhea. 01/17/2021 upon evaluation today Jareb actually appears to be doing excellent in regard to his legs. He has a few areas again that  come and go as far as his toes are concerned but overall this is doing quite well. 01/31/2021 upon evaluation today patient appears to be doing well with regard to his legs. Fortunately there does not appear to be any signs of active infection which is great news. Overall he is still having significant edema despite the compression wraps basically the 4-layer compression wrap to just keep things under control there is really not much room for play. 4/13: Mr. Savoca is a longstanding patient in our clinic and benefits greatly from weekly compression wraps. Today he has no complaints. He has been tolerating the wraps well. He states he is using the lymphedema pumps at home. 5/4; patient presents for follow-up of his chronic lymphedema/venous insufficiency ulcers. He comes weekly for compression wraps. He has no complaints today. He was unable to tolerate the Coflex 2 layer Last week so we will do the four press 4-layer compression. He has been using his lymphedema pumps daily. 5/18; patient presents for 2-week follow-up. He has no complaints or issues today. He has developed a new wound to the right foot on his fourth toe. He overall feels well and denies signs of infection. 6/1; patient presents for 2-week follow-up. He has no complaints or issues today. He denies signs of infection. Electronic Signature(s) Signed: 04/04/2021 11:44:44 AM By: Geralyn Corwin DO Entered By: Geralyn Corwin on 04/04/2021 11:36:43 -------------------------------------------------------------------------------- Physical Exam Details Patient Name: Date of Service: [name], Daeveon J. 04/04/2021 10:30 A M Medical Record Number: [id number] Patient Account Number: [id number] Date of Birth/Sex: Treating RN: Jun 19, 1951 (70 y.o. [name] Primary Care  Provider: Nicoletta Ba Other Clinician: Referring Provider: Treating Provider/Extender: Debe Coder in Treatment: 271 Constitutional respirations regular, non-labored and within target range for patient.Marland Kitchen Psychiatric pleasant and cooperative. Notes Left second toe and right fourth toe limited to skin breakdown with maceration noted. Excellent edema control to legs bilaterally with no acute signs of infection. Electronic Signature(s) Signed: 04/04/2021 11:44:44 AM By: Geralyn Corwin DO Entered By: Geralyn Corwin on 04/04/2021 11:38:14 -------------------------------------------------------------------------------- Physician Orders Details Patient Name: Date of Service: [name], Davyn J. 04/04/2021 10:30 A M Medical Record Number: [id number] Patient Account Number: [id number] Date of Birth/Sex: Treating RN: 02-05-1951 (70 y.o. [name] Primary Care Provider: Nicoletta Ba Other Clinician: Referring Provider: Treating Provider/Extender: Debe Coder in Treatment: [phone] Verbal / Phone Orders: No Diagnosis Coding ICD-10 Coding Code Description L97.521 Non-pressure chronic ulcer of other 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 of skin L97.829 Non-pressure chronic ulcer of other part of left lower leg with unspecified severity L97.519 Non-pressure chronic ulcer of other part of right foot with unspecified severity I87.333 Chronic venous hypertension (idiopathic) with ulcer and inflammation of bilateral lower extremity I89.0 Lymphedema, not elsewhere classified E11.622 Type 2 diabetes mellitus with other skin ulcer 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, unspecified Follow-up Appointments Return appointment in 3 weeks. - with Dr. Mikey Bussing Nurse Visit: - 6/8 and 6/15 Wed Bathing/ Shower/ Hygiene May shower with protection but do not get wound dressing(s) wet. Edema Control - Lymphedema / SCD /  Other Bilateral Lower Extremities Lymphedema Pumps. Use Lymphedema pumps on leg(s) 2-3 times a day for 45-60 minutes. If wearing any wraps or hose, do not remove them. Continue exercising as instructed. Elevate legs to the level of the heart or above for 30 minutes daily and/or when sitting, a frequency of: - throughout the day Avoid standing for long periods of time. Exercise  regularly Non Wound Condition Bilateral Lower Extremities Other Non Wound Condition Orders/Instructions: - lotion to both legs, 4 layer compression wraps both legs Wound Treatment Wound #193 - T Second oe Wound Laterality: Left Prim Dressing: KerraCel Ag Gelling Fiber Dressing, 2x2 in (silver alginate) Every Other Day/15 Days ary Discharge Instructions: Apply silver alginate to wound bed as instructed Secondary Dressing: Woven Gauze Sponges 2x2 in Every Other Day/15 Days Discharge Instructions: Apply over primary dressing as directed. Secured With: Insurance underwriter, Sterile 2x75 (in/in) Every Other Day/15 Days Discharge Instructions: Secure with stretch gauze as directed. Wound #196 - T Fourth oe Wound Laterality: Right Prim Dressing: KerraCel Ag Gelling Fiber Dressing, 2x2 in (silver alginate) Every Other Day/15 Days ary Discharge Instructions: Apply silver alginate to wound bed as instructed Secondary Dressing: Woven Gauze Sponges 2x2 in Every Other Day/15 Days Discharge Instructions: Apply over primary dressing as directed. Secured With: Insurance underwriter, Sterile 2x75 (in/in) Every Other Day/15 Days Discharge Instructions: Secure with stretch gauze as directed. Electronic Signature(s) Signed: 04/04/2021 11:44:44 AM By: Geralyn Corwin DO Entered By: Geralyn Corwin on 04/04/2021 11:40:47 -------------------------------------------------------------------------------- Problem List Details Patient Name: Date of Service: [name], Santana J. 04/04/2021 10:30 A M Medical Record  Number: [id number] Patient Account Number: [id number] Date of Birth/Sex: Treating RN: 12-17-1950 (70 y.o. [name] Primary Care Provider: Nicoletta Ba Other Clinician: Referring Provider: Treating Provider/Extender: Debe Coder in Treatment: 271 Active Problems ICD-10 Encounter Code Description Active Date MDM Diagnosis L97.521 Non-pressure chronic ulcer of other 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 of 04/27/2018 No Yes skin L97.829 Non-pressure chronic ulcer of other part of left lower leg with unspecified 03/21/2021 No Yes severity L97.519 Non-pressure chronic ulcer of other part of right foot with unspecified severity 03/21/2021 No Yes I87.333 Chronic venous hypertension (idiopathic) with ulcer and inflammation of 01/22/2016 No Yes bilateral lower extremity I89.0 Lymphedema, not elsewhere classified 01/22/2016 No Yes E11.622 Type 2 diabetes mellitus with other skin ulcer 01/22/2016 No Yes 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, unspecified 01/22/2016 No Yes Inactive Problems ICD-10 Code Description Active Date Inactive Date L03.115 Cellulitis of right lower limb 12/22/2017 12/22/2017 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 06/30/2018 06/30/2018 L97.511 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 of skin 06/30/2018 06/30/2018 Resolved Problems ICD-10 Code Description Active Date Resolved Date L97.211 Non-pressure chronic ulcer of right calf limited to breakdown of skin 06/30/2018 06/30/2018 L97.221 Non-pressure chronic ulcer of left calf limited to breakdown of skin 09/30/2016 09/30/2016 L03.116 Cellulitis of left lower limb 04/01/2017 04/01/2017 L97.211 Non-pressure chronic ulcer of right calf limited to breakdown of skin 06/30/2017 06/30/2017 Electronic Signature(s) Signed: 04/04/2021 11:44:44 AM By: Geralyn Corwin DO Entered By: Geralyn Corwin on 04/04/2021  11:34:35 -------------------------------------------------------------------------------- Progress Note/History and Physical Details Patient Name: Date of Service: [name], Elizandro J. 04/04/2021 10:30 A M Medical Record Number: [id number] Patient Account Number: [id number] Date of Birth/Sex: Treating RN: 1951/02/01 (71 y.o. [name] Primary Care Provider: Nicoletta Ba Other Clinician: Referring Provider: Treating Provider/Extender: Debe Coder in Treatment: 271 Subjective Chief Complaint Information obtained from Patient patient is here for evaluation of venous/lymphedema ulcers History of Present Illness (HPI) Referred by PCP for consultation. Patient has long standing history of BLE venous stasis, no prior ulcerations. At beginning of month, developed cellulitis and weeping. Received IM Rocephin followed by Keflex and resolved. Wears compression stocking, appr 6 months old. Not sure  strength. No present drainage. 01/22/16 this is a patient who is a type II diabetic on insulin. 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 He tells me he religiously wears pressure stockings of uncertain strength. He was here with weeping edema about 8 months ago but did not have an open wound. Roughly a month ago he had a reopening on his bilateral legs. He is been using bandages and Neosporin. He does not complain of pain. 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 Last BUN/creatinine I have is from 11/20/15 at 13 and 1.0 respectively 01/29/16; patient arrives today having tolerated the Profore wrap. He brought in his stockings and these are 18 mmHg stockings he bought from [place]. The compression here is likely inadequate. He does not complain of pain or excessive drainage she has no systemic symptoms. The wound on the right  looks improved as does the one on the left although one on the left is more substantial with still tissue at risk below the actual wound area on the bilateral posterior calf 02/05/16; patient arrives with poor edema control. He states that we did put a 4 layer compression on it last week. No weight appear 5 this. 02/12/16; the area on the posterior right Has healed. The left Has a substantial wound that has necrotic surface eschar that requires a debridement with a curette. 02/16/16;the patient called or a Nurse visit secondary to increased swelling. He had been in earlier in the week with his right leg healed. He was transitioned to is on pressure stocking on the right leg with the only open wound on the left, a substantial area on the left posterior calf. Note he has a history of severe lower extremity edema, he has a history of chronic atrial fibrillation but not heart failure per my notes but I'll need to research this. He is not complaining of chest pain shortness of breath or orthopnea. The intake nurse noted blisters on the previously closed right leg 02/19/16; this is the patient's regular visit day. I see him on Friday with escalating edema new wounds on the right leg and clear signs of at least right ventricular heart failure. I increased his Lasix to 40 twice a day. He is returning currently in follow-up. States he is noticed a decrease in that the edema 02/26/16 patient's legs have much less edema. There is nothing really open on the right leg. The left leg has improved condition of the large superficial wound on the posterior left leg 03/04/16; edema control is very much better. The patient's right leg wounds have healed. On the left leg he continues to have severe venous inflammation on the posterior aspect of the left leg. There is no tenderness and I don't think any of this is cellulitis. 03/11/16; patient's right leg is married healed and he is in his own stocking. The patient's left leg has  deteriorated somewhat. There is a lot of erythema around the wound on the posterior left leg. There is also a significant rim of erythema posteriorly just above where the wrap would've ended there is a new wound in this location and a lot of tenderness. Can't rule out cellulitis in this area. 03/15/16; patient's right leg remains healed and he is in his own stocking. The patient's left leg is much better than last review. His major wound on the posterior aspect of his left  Is almost fully epithelialized. He has 3 small injuries from the wraps. Really. Erythema seems a lot better on antibiotics 03/18/16; right leg remains healed and he is in his own stocking. The patient's left leg is much better. The area on the posterior aspect of the left calf is fully epithelialized. His 3 small injuries which were wrap injuries on the left are improved only one seems still open his erythema has resolved 03/25/16; patient's right leg remains healed and he is in his own stocking. There is no open area today on the left leg posterior leg is completely closed up. His wrap injuries at the superior aspect of his leg are also resolved. He looks as though he has some irritation on the dorsal ankle but this is fully epithelialized without evidence of infection. 03/28/16; we discharged this patient on Monday. Transitioned him into his own stocking. There were problems almost immediately with uncontrolled swelling weeping edema multiple some of which have opened. He does not feel systemically unwell in particular no chest pain no shortness of breath and he does not feel 04/08/16; the edema is under better control with the Profore light wrap but he still has pitting edema. There is one large wound anteriorly 2 on the medial aspect of his left leg and 3 small areas on the superior posterior calf. Drainage is not excessive he is tolerating a Profore light well 04/15/16; put a Profore wrap on him last week. This is controlled is edema  however he had a lot of pain on his left anterior foot most of his wounds are healed 04/22/16 once again the patient has denuded areas on the left anterior foot which he states are because his wrap slips up word. He saw his primary physician today is on Lasix 40 twice a day and states that he his weight is down 20 pounds over the last 3 months. 04/29/16: Much improved. left anterior foot much improved. He is now on Lasix 80 mg per day. Much improved edema control 05/06/16; I was hoping to be able to discharge him today however once again he has blisters at a low level of where the compression was placed last week mostly on his left lateral but also his left medial leg and a small area on the anterior part of the left foot. 05/09/16; apparently the patient went home after his appointment on 7/4 later in the evening developing pain in his upper medial thigh together with subjective fever and chills although his temperature was not taken. The pain was so intense he felt he would probably have to call 911. However he then remembered that he had leftover doxycycline from a previous round of antibiotics and took these. By the next morning he felt a lot better. He called and spoke to one of our nurses and I approved doxycycline over the phone thinking that this was in relation to the wounds we had previously seen although they were definitely were not. The patient feels a lot better old fever no chills he is still working. Blood sugars are reasonably controlled 05/13/16; patient is back in for review of his cellulitis on his anterior medial upper thigh. He is taking doxycycline this is a lot better. Culture I did of the nodular area on the dorsal aspect of his foot grew MRSA this also looks a lot better. 05/20/16; the patient is cellulitis on the medial upper thigh has resolved. All of his wound areas including the left anterior foot, areas on the medial aspect of  the left calf and the lateral aspect of the calf at  all resolved. He has a new blister on the left dorsal foot at the level of the fourth toe this was excised. No evidence of infection 05/27/16; patient continues to complain weeping edema. He has new blisterlike wounds on the left anterior lateral and posterior lateral calf at the top of his wrap levels. The area on his left anterior foot appears better. He is not complaining of fever, pain or pruritus in his feet. 05/30/16; the patient's blisters on his left anterior leg posterior calf all look improved. He did not increase the Lasix 100 mg as I suggested because he was going to run out of his 40 mg tablets. He is still having weeping edema of his toes 06/03/16; I renewed his Lasix at 80 mg once a day as he was about to run out when I last saw him. He is on 80 mg of Lasix now. I have asked him to cut down on the excessive amount of water he was drinking and asked him to drink according to his thirst mechanisms 06/12/2016 -- was seen 2 days ago and was supposed to wear his compression stockings at home but he is developed lymphedema and superficial blisters on the left lower extremity and hence came in for a review 06/24/16; the remaining wound is on his left anterior leg. He still has edema coming from between his toes. There is lymphedema here however his edema is generally better than when I last saw this. He has a history of atrial fibrillation but does not have a known history of congestive heart failure nevertheless I think he probably has this at least on a diastolic basis. 07/01/16 I reviewed his echocardiogram from January 2017. This was essentially normal. He did not have LVH, EF of 55-60%. His right ventricular function was normal although he did have trivial tricuspid and pulmonic regurgitation. This is not audible on exam however. I increased his Lasix to do massive edema in his legs well above his knees I think in early July. He was also drinking an excessive amount of water at the  time. 07/15/16; missed his appointment last week because of the Labor Day holiday on Monday. He could not get another appointment later in the week. Started to feel the wrap digging in superiorly so we remove the top half and the bottom half of his wrap. He has extensive erythema and blistering superiorly in the left leg. Very tender. Very swollen. Edema in his foot with leaking edema fluid. He has not been systemically unwell 07/22/16; the area on the left leg laterally required some debridement. The medial wounds look more stable. His wrap injury wounds appear to have healed. Edema and his foot is better, weeping edema is also better. He tells me he is meeting with the supplier of the external compression pumps at work 08/05/16; the patient was on vacation last week in [facility]. His wrap is been on for an extended period of time. Also over the weekend he developed an extensive area of tender erythema across his anterior medial thigh. He took to doxycycline yesterday that he had leftover from a previous prescription. The patient complains of weeping edema coming out of his toes 08/08/16; I saw this patient on 10/2. He was tender across his anterior thigh. I put him on doxycycline. He returns today in follow-up. He does not have any open wounds on his lower leg, he still has edema weeping into his toes.  08/12/16; patient was seen back urgently today to follow-up for his extensive left thigh cellulitis/erysipelas. He comes back with a lot less swelling and erythema pain is much better. I believe I gave him Augmentin and Cipro. His wrap was cut down as he stated a roll down his legs. He developed blistering above the level of the wrap that remained. He has 2 open blisters and 1 intact. 08/19/16; patient is been doing his primary doctor who is increased his Lasix from 40-80 once a day or 80 already has less edema. Cellulitis has remained improved in the left thigh. 2 open areas on the posterior left  calf 08/26/16; he returns today having new open blisters on the anterior part of his left leg. He has his compression pumps but is not yet been shown how to use some vital representative from the supplier. 09/02/16 patient returns today with no open wounds on the left leg. Some maceration in his plantar toes 09/10/2016 -- Dr. Leanord Hawking had recently discharged him on 09/02/2016 and he has come right back with redness swelling and some open ulcers on his left lower extremity. He says this was caused by trying to apply his compression stockings and he's been unable to use this and has not been able to use his lymphedema pumps. He had some doxycycline leftover and he has started on this a few days ago. 09/16/16; there are no open wounds on his leg on the left and no evidence of cellulitis. 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 He does not complain of symptoms here. I am not clear use using his external compression pumps. 09/23/16; I have not seen this patient in 2 weeks. He canceled his appointment 10 days ago as he was going on vacation. He tells me that on Monday he noticed a large area on his posterior left leg which is been draining copiously and is reopened into a large wound. He is been using ABDs and the external part of his juxtalite, according to our nurse this was not on properly. 10/07/16; Still a substantial area on the posterior left leg. Using silver alginate 10/14/16; in general better although there is still open area which looks healthy. Still using silver alginate. He reminds me that this happen before he left for [facility]. T oday while he was showering in the morning. He had been using his juxtalite's 10/21/16; the area on his posterior left leg is fully epithelialized. However he arrives today with a large area of tender erythema in his medial and posterior left thigh just above the knee. I have marked the area. Once again  he is reluctant to consider hospitalization. I treated him with oral antibiotics in the past for a similar situation with resolution I think with doxycycline however this area it seems more extensive to me. He is not complaining of fever but does have chills and says states he is thirsty. His blood sugar today was in the 140s at home 10/25/16 the area on his posterior left leg is fully epithelialized although there is still some weeping edema. The large area of tenderness and erythema in his medial and posterior left thigh is a lot less tender although there is still a lot of swelling in this thigh. He states he feels a lot better. He is on doxycycline and Augmentin that I started last week. This will continued until Tuesday, December 26. I have ordered a duplex ultrasound of the left thigh rule  out DVT whether there is an abscess something that would need to be drained I would also like to know. 11/01/16; he still has weeping edema from a not fully epithelialized area on his left posterior calf. Most of the rest of this looks a lot better. He has completed his antibiotics. His thigh is a lot better. Duplex ultrasound did not show a DVT in the thigh 11/08/16; he comes in today with more Denuded surface epithelium from the posterior aspect of his calf. There is no real evidence of cellulitis. The superior aspect of his wrap appears to have put quite an indentation in his leg just below the knee and this may have contributed. He does not complain of pain or fever. We have been using silver alginate as the primary dressing. The area of cellulitis in the right thigh has totally resolved. He has been using his compression stockings once a week 11/15/16; the patient arrives today with more loss of epithelium from the posterior aspect of his left calf. He now has a fairly substantial wound in this area. The reason behind this deterioration isn't exactly clear although his edema is not well controlled. He states  he feels he is generally more swollen systemically. He is not complaining of chest pain shortness of breath fever. T me he has an appointment with his primary physician in early February. He is on 80 mg of oral ells Lasix a day. He claims compliance with the external compression pumps. He is not having any pain in his legs similar to what he has with his recurrent cellulitis 11/22/16; the patient arrives a follow-up of his large area on his left lateral calf. This looks somewhat better today. He came in earlier in the week for a dressing change since I saw him a week ago. He is not complaining of any pain no shortness of breath no chest pain 11/28/16; the patient arrives for follow-up of his large area on the left lateral calf this does not look better. In fact it is larger weeping edema. The surface of the wound does not look too bad. We have been using silver alginate although I'm not certain that this is a dressing issue. 12/05/16; again the patient follows up for a large wound on the left lateral and left posterior calf this does not look better. There continues to be weeping edema necrotic surface tissue. More worrisome than this once again there is erythema below the wound involving the distal Achilles and heel suggestive of cellulitis. He is on his feet working most of the day of this is not going well. We are changing his dressing twice a week to facilitate the drainage. 12/12/16; not much change in the overall dimensions of the large area on the left posterior calf. This is very inflamed looking. I gave him an. Doxycycline last week does not really seem to have helped. He found the wrap very painful indeed it seems to of dog into his legs superiorly and perhaps around the heel. He came in early today because the drainage had soaked through his dressings. 12/19/16- patient arrives for follow-up evaluation of his left lower extremity ulcers. He states that he is using his lymphedema pumps once daily  when there is "no drainage". He admits to not using his lipedema pumps while under current treatment. His blood sugars have been consistently between 150-200. 12/26/16; the patient is not using his compression pumps at home because of the wetness on his feet. I've advised him that I think  it's important for him to use this daily. He finds his feet too wet, he can put a plastic bag over his legs while he is in the pumps. Otherwise I think will be in a vicious circle. We are using silver alginate to the major area on his left posterior calf 01/02/17; the patient's posterior left leg has further of all into 3 open wounds. All of them covered with a necrotic surface. He claims to be using his compression pumps once a day. His edema control is marginal. Continue with silver alginate 01/10/17; the patient's left posterior leg actually looks somewhat better. There is less edema, less erythema. Still has 3 open areas covered with a necrotic surface requiring debridement. He claims to be using his compression pumps once a day his edema control is better 01/17/17; the patient's left posterior calf look better last week when I saw him and his wrap was changed 2 days ago. He has noted increasing pain in the left heel and arrives today with much larger wounds extensive erythema extending down into the entire heel area especially tender medially. He is not systemically unwell CBGs have been controlled no fever. Our intake nurse showed me limegreen drainage on his AVD pads. 01/24/17; his usual this patient responds nicely to antibiotics last week giving him Levaquin for presumed Pseudomonas. The whole entire posterior part of his leg is much better much less inflamed and in the case of his Achilles heel area much less tender. He has also had some epithelialization posteriorly there are still open areas here and still draining but overall considerably better 01/31/17- He has continue to tolerate the compression wraps. he  states that he continues to use the lymphedema pumps daily, and can increase to twice daily on the weekends. He is voicing no complaints or concerns regarding his LLE ulcers 02/07/17-he is here for follow-up evaluation. He states that he noted some erythema to the left medial and anterior thigh, which he states is new as of yesterday. He is concerned about recurrent cellulitis. He states his blood sugars have been slightly elevated, this morning in the 180s 02/14/17; he is here for follow-up evaluation. When he was last here there was erythema superiorly from his posterior wound in his anterior thigh. He was prescribed Levaquin however a culture of the wound surface grew MRSA over the phone I changed him to doxycycline on Monday and things seem to be a lot better. 02/24/17; patient missed his appointment on Friday therefore we changed his nurse visit into a physician visit today. Still using silver alginate on the large area of the posterior left thigh. He isn't new area on the dorsal left second toe 03/03/17; actually better today although he admits he has not used his external compression pumps in the last 2 days or so because of work responsibilities over the weekend. 03/10/17; continued improvement. External compression pumps once a day almost all of his wounds have closed on the posterior left calf. Better edema control 03/17/17; in general improved. He still has 3 small open areas on the lateral aspect of his left leg however most of the area on the posterior part of his leg is epithelialized. He has better edema control. He has an ABD pad under his stocking on the right anterior lower leg although he did not let us look at that today. 03/24/17; patient arrives back in clinic today with no open areas however there are areas on the posterior left calf and anterior left calf that are less  than 100% epithelialized. His edema is well controlled in the left lower leg. There is some pitting edema probably  lymphedema in the left upper thigh. He uses compression pumps at home once per day. I tried to get him to do this twice a day although he is very reticent. 04/01/2017 -- for the last 2 days he's had significant redness, tenderness and weeping and came in for an urgent visit today. 04/07/17; patient still has 6 more days of doxycycline. He was seen by Dr. Meyer Russel last Wednesday for cellulitis involving the posterior aspect, lateral aspect of his Involving his heel. For the most part he is better there is less erythema and less weeping. He has been on his feet for 12 hours o2 over the weekend. Using his compression pumps once a day 04/14/17 arrives today with continued improvement. Only one area on the posterior left calf that is not fully epithelialized. He has intense bilateral venous inflammation associated with his chronic venous insufficiency disease and secondary lymphedema. We have been using silver alginate to the left posterior calf wound In passing he tells [place] today that the right leg but we have not seen in quite some time has an open area on it but he doesn't want [place] to look at this today states he will show this to [place] next week. 04/21/17; there is no open area on his left leg although he still reports some weeping edema. He showed [place] his right leg today which is the first time we've seen this leg in a long time. He has a large area of open wound on the right leg anteriorly healthy granulation. Quite a bit of swelling in the right leg and some degree of venous inflammation. He told us about the right leg in passing last week but states that deterioration in the right leg really only happened over the weekend 04/28/17; there is no open area on the left leg although there is an irritated part on the posterior which is like a wrap injury. The wound on the right leg which was new from last week at least to [place] is a lot better. 05/05/17; still no open area on the left leg. Patient is using his new  compression stocking which seems to be doing a good job of controlling the edema. He states he is using his compression pumps once per day. The right leg still has an open wound although it is better in terms of surface area. Required debridement. A lot of pain in the posterior right Achilles marked tenderness. Usually this type of presentation this patient gives concern for an active cellulitis 05/12/17; patient arrives today with his major wound from last week on the right lateral leg somewhat better. Still requiring debridement. He was using his compression stocking on the left leg however that is reopened with superficial wounds anteriorly he did not have an open wound on this leg previously. He is still using his juxta light's once daily at night. He cannot find the time to do this in the morning as he has to be at work by 7 AM 05/19/17; right lateral leg wound looks improved. No debridement required. The concerning area is on the left posterior leg which appears to almost have a subcutaneous hemorrhagic component to it. We've been using silver alginate to all the wounds 05/26/17; the right lateral leg wound continues to look improved. However the area on the left posterior calf is a tightly adherent surface. [place] using silver alginate. Because of the weeping  edema in his legs there is very little good alternatives. 06/02/17; the patient left here last week looking quite good. Major wound on the left posterior calf and a small one on the right lateral calf. Both of these look satisfactory. He tells me that by Wednesday he had noted increased pain in the left leg and drainage. He called on Thursday and Friday to get an appointment here but we were blocked. He did not go to urgent care or his primary physician. He thinks he had a fever on Thursday but did not actually take his temperature. He has not been using his compression pumps on the left leg because of pain. I advised him to go to the  emergency room today for IV antibiotics for stents of left leg cellulitis but he has refused I have asked him to take 2 days off work to keep his leg elevated and he has refused this as well. In view of this I'm going to call him and Augmentin and doxycycline. 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 However after a detailed discussion regarding the risk benefits and alternatives he refuses to get admitted to the hospital. With no other recourse I will continue him on oral antibiotics as before and hopefully he'll have his infectious disease consultation this week. 06/16/2017 -- the patient was seen today by the nurse practitioner at infectious disease Ms. Dixon. 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 He has also been advise Lotrimin cream locally. she also had wise IV antibiotics for his condition if it flares up 06/23/17; patient arrives today with drainage bilaterally although the remaining wound on the left posterior calf after cleaning up today "highlighter yellow drainage" did not look too bad. Unfortunately he has had breakdown on the right anterior leg [previously this leg had not been open and he is using a black stocking] he went to see infectious disease and is been put on clindamycin 150 mg daily, I did not verify the dose although I'm not familiar with using clindamycin in this dosing range, perhaps for prophylaxisoo 06/27/17; I brought this patient back today to follow-up on the wound deterioration on the right lower leg together with surrounding cellulitis. I started  him on doxycycline 4 days ago. This area looks better however he comes in today with intense cellulitis on the medial part of his left thigh. This is not have a wound in this area. Extremely tender. We've been using silver alginate to the wounds on the right lower leg left lower leg with bilateral 4 layer compression he is using his external compression pumps once a day 07/04/17; patient's left medial thigh cellulitis looks better. He has not been using his compression pumps as his insert said it was contraindicated with cellulitis. His right leg continues to make improvements all the wounds are still open. We only have one remaining wound on the left posterior calf. Using silver alginate to all open areas. He is on doxycycline which I started a week ago and should be finishing I gave him Augmentin after Thursday's visit for  the severe cellulitis on the left medial thigh which fortunately looks better 07/14/17; the patient's left medial thigh cellulitis has resolved. The cellulitis in his right lower calf on the right also looks better. All of his wounds are stable to improved we've been using silver alginate he has completed the antibiotics I have given him. He has clindamycin 150 mg once a day prescribed by infectious disease for prophylaxis, I've advised him to start this now. We have been using bilateral Unna boots over silver alginate to the wound areas 07/21/17; the patient is been to see infectious disease who noted his recurrent problems with cellulitis. He was not able to tolerate prophylactic clindamycin therefore he is on amoxicillin 500 twice a day. He also had a second daily dose of Lasix added By Dr. Oneta Rack but he is not taking this. Nor is he being completely compliant with his compression pumps a especially not this week. He has 2 remaining wounds one on the right posterior lateral lower leg and one on the left posterior medial lower leg. 07/28/17; maintain on Amoxil 500 twice a day as  prophylaxis for recurrent cellulitis as ordered by infectious disease. The patient has Unna boots bilaterally. 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 He has bilateral Unna boots for compression and silver alginate to his wounds. Arrives today with his legs looking as good as I have seen him in quite some time. Not surprisingly his wounds look better as well with improvement on the right lateral leg venous insufficiency wound and also the left medial leg. He is still using the compression pumps once a day 08/11/17; both legs appear to be doing better wounds on the right lateral and left medial legs look better. Skin on the right leg quite good. He is been using silver alginate as the primary dressing. I'm going to use Anasept gel calcium alginate and maintain all the secondary dressings 08/18/17; the patient continues to actually do quite well. The area on his right lateral leg is just about closed the left medial also looks better although it is still moist in this area. His edema is well controlled we have been using Anasept gel with calcium alginate and the usual secondary dressings, 4 layer compression and once daily use of his compression pumps "always been able to manage 09/01/17; the patient continues to do reasonably well in spite of his trip to [place]. The area on the right lateral leg is epithelialized. Left is much better but still open. He has more edema and more chronic erythema on the left leg [venous inflammation] 09/08/17; he arrives today with no open wound on the right lateral leg and decently controlled edema. Unfortunately his left leg is not nearly as in his good situation as last week.he apparently had increasing edema starting on Saturday. He edema soaked through into his foot so used a plastic bag to walk around his home. The  area on the medial right leg which was his open area is about the same however he has lost surface epithelium on the left lateral which is new and he has significant pain in the Achilles area of the left foot. He is already on amoxicillin chronically for prophylaxis of cellulitis in the left leg 09/15/17; he is completed a week of doxycycline and the cellulitis in the left posterior leg and Achilles area is as  usual improved. He still has a lot of edema and fluid soaking through his dressings. There is no open wound on the right leg. He saw infectious disease NP today 09/22/17;As usual 1 we transition him from our compression wraps to his stockings things did not go well. He has several small open areas on the right leg. He states this was caused by the compression wrap on his skin although he did not wear this with the stockings over them. He has several superficial areas on the left leg medially laterally posteriorly. He does not have any evidence of active cellulitis especially involving the left Achilles The patient is traveling from [facility] Saturday going to [facility]. He states he isn't attempting to get an appointment with a heel objects wound center there to change his dressings. I am not completely certain whether this will work 10/06/17; the patient came in on Friday for a nurse visit and the nurse reported that his legs actually look quite good. He arrives in clinic today for his regular follow-up visit. He has a new wound on his left third toe over the PIP probably caused by friction with his footwear. He has small areas on the left leg and a very superficial but epithelialized area on the right anterior lateral lower leg. Other than that his legs look as good as I've seen him in quite some time. We have been using silver alginate Review of systems; no chest pain no shortness of breath other than this a 10 point review of systems negative 10/20/17; seen by Dr. Meyer Russel last week. He had  taken some antibiotics [doxycycline] that he had left over. Dr. Meyer Russel thought he had candida infection and declined to give him further antibiotics. He has a small wound remaining on the right lateral leg several areas on the left leg including a larger area on the left posterior several left medial and anterior and a small wound on the left lateral. The area on the left dorsal third toe looks a lot better. ROS; Gen.; no fever, respiratory no cough no sputum Cardiac no chest pain other than this 10 point review of system is negative 10/30/17; patient arrives today having fallen in the bathtub 3 days ago. It took him a while to get up. He has pain and maceration in the wounds on his left leg which have deteriorated. He has not been using his pumps he also has some maceration on the right lateral leg. 11/03/17; patient continues to have weeping edema especially in the left leg. This saturates his dressings which were just put on on 12/27. As usual the doxycycline seems to take care of the cellulitis on his lower leg. He is not complaining of fever, chills, or other systemic symptoms. He states his leg feels a lot better on the doxycycline I gave him empirically. He also apparently gets injections at his primary doctor's officeo Rocephin for cellulitis prophylaxis. I didn't ask him about his compression pump compliance today I think that's probably marginal. Arrives in the clinic with all of his dressings primary and secondary macerated full of fluid and he has bilateral edema 11/10/17; the patient's right leg looks some better although there is still a cluster of wounds on the right lateral. The left leg is inflamed with almost circumferential skin loss medially to laterally although we are still maintaining anteriorly. He does not have overt cellulitis there is a lot of drainage. He is not using compression pumps. We have been using silver alginate to the  wound areas, there are not a lot of options  here 11/17/17; the patient's right leg continues to be stable although there is still open wounds, better than last week. The inflammation in the left leg is better. Still loss of surface layer epithelium especially posteriorly. There is no overt cellulitis in the amount of edema and his left leg is really quite good, tells me he is using his compression pumps once a day. 11/24/17; patient's right leg has a small superficial wound laterally this continues to improve. The inflammation in the left leg is still improving however we have continuous surface layer epithelial loss posteriorly. There is no overt cellulitis in the amount of edema in both legs is really quite good. He states he is using his compression pumps on the left leg once a day for 5 out of 7 days 12/01/17; very small superficial areas on the right lateral leg continue to improve. Edema control in both legs is better today. He has continued loss of surface epithelialization and left posterior calf although I think this is better. We have been using silver alginate with large number of absorptive secondary dressings 4 layer on the left Unna boot on the right at his request. He tells me he is using his compression pumps once a day 12/08/17; he has no open area on the right leg is edema control is good here. ooOn the left leg however he has marked erythema and tenderness breakdown of skin. He has what appears to be a wrap injury just distal to the popliteal fossa. 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 There was swelling erythema and breakdown in the right leg. He has much worse skin breakdown in the left leg as well multiple open areas medially and posteriorly as  well as laterally. He tells me he has been using his compression pumps but tells me he feels that the drainage out of his leg is worse when he uses a compression pumps. T be fair to him he is been saying this o for a while however I don't know that I have really been listening to this. I wonder if the compression pumps are working properly 12/22/17;. Once again he arrives with severe erythema, weeping edema from the left greater than right leg. Noncompliance with compression pumps. New this visit he is complaining of pain on the lateral aspect of the right leg and the medial aspect of his right thigh. He apparently saw his cardiologist Dr. [name] who was ordered an echocardiogram area and I think this is a step in the right direction 12/25/17; started his doxycycline Monday night. There is still intense erythema of the right leg especially in the anterior thigh although there is less tenderness. The erythema around the wound on the right lateral calf also is less tender. He still complaining of pain in the left heel. His wounds are about the same right lateral left medial left lateral. Superficial but certainly not close to closure. He denies being systemically unwell no fever chills no abdominal pain no diarrhea 12/29/17; back in follow-up of his extensive right calf and right thigh cellulitis. I added amoxicillin to cover possible doxycycline resistant strep. This seems to of done the trick he is in much  less pain there is much less erythema and swelling. He has his echocardiogram at 11:00 this morning. X-ray of the left heel was also negative. 01/05/18; the patient arrived with his edema under much better control. Now that he is retired he is able to use his compression pumps daily and sometimes twice a day per the patient. He has a wound on the right leg the lateral wound looks better. Area on the left leg also looks a lot better. He has no evidence of cellulitis in his bilateral thighs I had a quick  peak at his echocardiogram. He is in normal ejection fraction and normal left ventricular function. He has moderate pulmonary hypertension moderately reduced right ventricular function. One would have to wonder about chronic sleep apnea although he says he doesn't snore. He'll review the echocardiogram with his cardiologist. 01/12/18; the patient arrives with the edema in both legs under exemplary control. He is using his compression pumps daily and sometimes twice daily. His wound on the right lateral leg is just about closed. He still has some weeping areas on the posterior left calf and lateral left calf although everything is just about closed here as well. I have spoken with Aldean Baker who is the patient's nurse practitioner and infectious disease. She was concerned that the patient had not understood that the parenteral penicillin injections he was receiving for cellulitis prophylaxis was actually benefiting him. I don't think the patient actually saw that I would tend to agree we were certainly dealing with less infections although he had a serious one last month. 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 He is healed. He'll be placed in juxtalite compression wraps and increase his lymphedema pumps to twice daily. We will follow up again next week to ensure there are no issues with the new regiment. 01/20/18-he is here for evaluation of bilateral lower extremity weeping edema. Yesterday he was placed in compression wrap to the right lower extremity and compression stocking to left lower shrubbery. He states he uses lymphedema pumps last night and again this morning and noted a blister to the left lower extremity. On exam he was noted to have drainage to the right lower extremity. He will be placed in Unna boots bilaterally and follow-up next week 01/26/18; patient was actually discharged a week ago to his own juxta light stockings only to return the next  day with bilateral lower extremity weeping edema.he was placed in bilateral Unna boots. He arrives today with pain in the back of his left leg. There is no open area on the right leg however there is a linear/wrap injury on the left leg and weeping edema on the left leg posteriorly. I spoke with infectious disease about 10 days ago. They were disappointed that the patient elected to discontinue prophylactic intramuscular penicillin shots as they felt it was particularly beneficial in reducing the frequency of his cellulitis. I discussed this with the patient today. He does not share this view. He'll definitely need antibiotics today. Finally he is traveling to [place] and trauma leaving this Saturday and returning a week later and he does not travel with his pumps. He is going by car 01/30/18; patient was seen 4 days ago and brought back in today for review of cellulitis in the left leg posteriorly. I put him on amoxicillin this really hasn't helped as much as I might like. He is also worried because he is traveling to [facility] trauma by car. Finally we will be  rewrapping him. There is no open area on the right leg over his left leg has multiple weeping areas as usual 02/09/18; The same wrap on for 10 days. He did not pick up the last doxycycline I prescribed for him. He apparently took 4 days worth he already had. There is nothing open on his right leg and the edema control is really quite good. He's had damage in the left leg medially and laterally especially probably related to the prolonged use of Unna boots 02/12/18; the patient arrived in clinic today for a nurse visit/wrap change. He complained of a lot of pain in the left posterior calf. He is taking doxycycline that I previously prescribed for him. Unfortunately even though he used his stockings and apparently used to compression pumps twice a day he has weeping edema coming out of the lateral part of his right leg. This is coming from the  lower anterior lateral skin area. 02/16/18; the patient has finished his doxycycline and will finish the amoxicillin 2 days. The area of cellulitis in the left calf posteriorly has resolved. He is no longer having any pain. He tells me he is using his compression pumps at least once a day sometimes twice. 02/23/18; the patient finished his doxycycline and Amoxil last week. On Friday he noticed a small erythematous circle about the size of a quarter on the left lower leg just above his ankle. This rapidly expanded and he now has erythema on the lateral and posterior part of the thigh. This is bright red. Also has an area on the dorsal foot just above his toes and a tender area just below the left popliteal fossa. He came off his prophylactic penicillin injections at his own insistence one or 2 months ago. This is obviously deteriorated since then 03/02/18; patient is on doxycycline and Amoxil. Culture I did last week of the weeping area on the back of his left calf grew group B strep. I have therefore renewed the amoxicillin 500 3 times a day for a further week. He has not been systemically unwell. Still complaining of an area of discomfort right under his left popliteal fossa. There is no open wound on the right leg. He tells me that he is using his pumps twice a day on most days 03/09/18; patient arrives in clinic today completing his amoxicillin today. The cellulitis on his left leg is better. Furthermore he tells me that he had intramuscular penicillin shots that his primary care office today. However he also states that the wrap on his right leg fell down shortly after leaving clinic last week. He developed a large blister that was present when he came in for a nurse visit later in the week and then he developed intense discomfort around this area.He tells me he is using his compression pumps 03/16/18; the patient has completed his doxycycline. The infectious part of this/cellulitis in the left heel area  left popliteal area is a lot better. He has 2 open areas on the right calf. Still areas on the left calf but this is a lot better as well. 03/24/18; the patient arrives complaining of pain in the left popliteal area again. He thinks some of this is wrap injury. He has no open area on the right leg and really no open area on the left calf either except for the popliteal area. He claims to be compliant with the compression pumps 03/31/18; I gave him doxycycline last week because of cellulitis in the left popliteal area. This  is a lot better although the surface epithelium is denuded off and response to this. He arrives today with uncontrolled edema in the right calf area as well as a fingernail injury in the right lateral calf. There is only a few open areas on the left 04/06/18; I gave him amoxicillin doxycycline over the last 2 weeks that the amoxicillin should be completing currently. He is not complaining of any pain or systemic symptoms. The only open areas see has is on the right lateral lower leg paradoxically I cannot see anything on the left lower leg. He tells me he is using his compression pumps twice a day on most days. Silver alginate to the wounds that are open under 4 layer compression 04/13/18; he completed antibiotics and has no new complaints. Using his compression pumps. Silver alginate that anything that's opened 04/20/18; he is using his compression pumps religiously. Silver alginate 4 layer compression anything that's opened. He comes in today with no open wounds on the left leg but 3 on the right including a new one posteriorly. He has 2 on the right lateral and one on the right posterior. He likes Unna boots on the right leg for reasons that aren't really clear we had the usual 4 layer compression on the left. It may be necessary to move to the 4 layer compression on the right however for now I left them in the Unna boots 04/27/18; he is using his compression pumps at least once a day.  He has still the wounds on the right lateral calf. The area right posteriorly has closed. He does not have an open wound on the left under 4 layer compression however on the dorsal left foot just proximal to the toes and the left third toe 2 small open areas were identified 05/11/18; he has not uses compression pumps. The areas on the right lateral calf have coalesced into one large wound necrotic surface. On the left side he has one small wound anteriorly however the edema is now weeping out of a large part of his left leg. He says he wasn't using his pumps because of the weeping fluid. I explained to him that this is the time he needs to pump more 05/18/18; patient states he is using his compression pumps twice a day. The area on the right lateral large wound albeit superficial. On the left side he has innumerable number of small new wounds on the left calf particularly laterally but several anteriorly and medially. All these appear to have healthy granulated base these look like the remnants of blisters however they occurred under compression. The patient arrives in clinic today with his legs somewhat better. There is certainly less edema, less multiple open areas on the left calf and the right anterior leg looks somewhat better as well superficial and a little smaller. However he relates pain and erythema over the last 3-4 days in the thigh and I looked at this today. He has not been systemically unwell no fever no chills no change in blood sugar values 05/25/18; comes in today in a better state. The severe cellulitis on his left leg seems better with the Keflex. Not as tender. He has not been systemically unwell ooHard to find an open wound on the left lower leg using his compression pumps twice a day ooThe confluent wounds on his right lateral calf somewhat better looking. These will ultimately need debridement I didn't do this today. 06/01/18; the severe cellulitis on the left anterior thigh has  resolved and he is completed his Keflex. ooThere is no open wound on the left leg however there is a superficial excoriation at the base of the third toe dorsally. Skin on the bottom of his left foot is macerated looking. ooThe left the wounds on the lateral right leg actually looks some better although he did require debridement of the top half of this wound area with an open curet 06/09/18 on evaluation today patient appears to be doing poorly in regard to his right lower extremity in particular this appears to likely be infected he has very thick purulent discharge along with a bright green tent to the discharge. This makes me concerned about the possibility of pseudomonas. He's also having increased discomfort at this point on evaluation. Fortunately there does not appear to be any evidence of infection spreading to the other location at this time. 06/16/18 on evaluation today patient appears to actually be doing fairly well. His ulcer has actually diminished in size quite significantly at this point which is good news. Nonetheless he still does have some evidence of infection he did see infectious disease this morning before coming here for his appointment. I did review the results of their evaluation and their note today. They did actually have him discontinue the Cipro and initiate treatment with linezolid at this time. He is doing this for the next seven days and they recommended a follow-up in four months with them. He is the keep a log of the need for intermittent antibiotic therapy between now and when he falls back up with infectious disease. This will help them gaze what exactly they need to do to try and help them out. 06/23/18; the patient arrives today with no open wounds on the left leg and left third toe healed. He is been using his compression pumps twice a day. On the right lateral leg he still has a sizable wound but this is a lot better than last time I saw this. In my absence he  apparently cultured MRSA coming from this wound and is completed a course of linezolid as has been directed by infectious disease. Has been using silver alginate under 4 layer compression 06/30/18; the only open wound he has is on the right lateral leg and this looks healthy. No debridement is required. We have been using silver alginate. He does not have an open wound on the left leg. There is apparently some drainage from the dorsal proximal third toe on the left although I see no open wound here. 07/03/18 on evaluation today patient was actually here just for a nurse visit rapid change. However when he was here on Wednesday for his rat change due to having been healed on the left and then developing blisters we initiated the wrap again knowing that he would be back today for [place] to reevaluate and see were at. Unfortunately he has developed some cellulitis into the proximal portion of his right lower extremity even into the region of his thigh. 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 He was placed on one as what at that point. Nonetheless he is done with that and has been tolerating it well otherwise. Doxycycline which in the past really did not seem to be effective for him. Nonetheless I think the best option may be for [place] to definitely reinitiate the antibiotics for a longer period of time. 07/07/18; since I last saw this patient a week ago he has had a  difficult time. At that point he did not have an open wound on his left leg. We transitioned him into juxta light stockings. He was apparently in the clinic the next day with blisters on the left lateral and left medial lower calf. He also had weeping edema fluid. He was put back into a compression wrap. He was also in the clinic on Friday with intense erythema in his right thigh. Per the patient he was started on Bactrim however that didn't work at all in terms of relieving his pain and swelling. He has taken  3 doxycycline that he had left over from last time and that seems to of helped. He has blistering on the right thigh as well. 07/14/18; the erythema on his right thigh has gotten better with doxycycline that he is finishing. 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 Presumably this cellulitis in the thigh was not related to that although I think this is a bit concerning going forward. He still has an area on the right lateral calf the blister on the right medial calf just below the knee that was discussed above. On the left 2 small open areas left medial and left lateral. Edema control is adequate. He is using his compression pumps twice a day 07/20/18; continued improvement in the condition of both legs especially the edema in his bilateral thighs. He tells me he is been losing weight through a combination of diet and exercise. He is using his compression pumps twice a day. So overall she made to the remaining wounds 07/27/2018; continued improvement in condition of both legs. His edema is well controlled. The area on the right lateral leg is just about closed he had one blisters show up on the medial left upper calf. We have him in 4 layer compression. He is going on a 10-day trip to [place], [place] and [place]. He will be driving. He wants to wear Unna boots because of the lessening amount of constriction. He will not use compression pumps while he is away 08/05/18 on evaluation today patient actually appears to be doing decently well all things considered in regard to his bilateral lower extremities. The worst ulcer is actually only posterior aspect of his left lower extremity with a four layer compression wrap cut into his leg a couple weeks back. He did have a trip and actually had [employer] for the trip that he is worn since he was last here. Nonetheless he feels like the [employer] actually do better for  him his swelling is up a little bit but he also with his trip was not taking his Lasix on a regular set schedule like he was supposed to be. He states that obviously the reason being that he cannot drive and keep going without having to urinate too frequently which makes it difficult. He did not have his pumps with him while he was away either which I think also maybe playing a role here too. 08/13/2018; the patient only has a small open wound on the right lateral calf which is a big improvement in the last month or 2. He also has the area posteriorly just below the posterior fossa on the left which I think was a wrap injury from several weeks ago. He has no current evidence of cellulitis. He tells me he is back into his compression pumps twice a day. He also tells me  that while he was at the laundromat somebody stole a section of his extremitease stockings 08/20/2018; back in the clinic with a much improved state. He only has small areas on the right lateral mid calf which is just about healed. This was is more substantial area for quite a prolonged period of time. He has a small open area on the left anterior tibia. The area on the posterior calf just below the popliteal fossa is closed today. He is using his compression pumps twice a day 08/28/2018; patient has no open wound on the right leg. He has a smattering of open areas on the calf with some weeping lymphedema. More problematically than that it looks as though his wraps of slipped down in his usual he has very angry upper area of edema just below the right medial knee and on the right lateral calf. He has no open area on his feet. The patient is traveling to [facility] next week. I will send him in an antibiotic. We will continue to wrap the right leg. We ordered extremitease stockings for him last week and I plan to transition the right leg to a stocking when he gets home which will be in 10 days time. As usual he is very reluctant to take  his pumps with him when he travels 09/07/2018; patient returns from [facility]. He shows me a picture of his left leg in the mid part of his trip last week with intense fire engine erythema. The picture look bad enough I would have considered sending him to the hospital. Instead he went to the wound care center in [facility]. They did not prescribe him antibiotics but he did take some doxycycline he had leftover from a previous visit. I had given him trimethoprim sulfamethoxazole before he left this did not work according to the patient. This is resulted in some improvement fortunately. He comes back with a large wound on the left posterior calf. Smaller area on the left anterior tibia. Denuded blisters on the dorsal left foot over his toes. Does not have much in the way of wounds on the right leg although he does have a very tender area on the right posterior area just below the popliteal fossa also suggestive of infection. He promises me he is back on his pumps twice a day 09/15/2018; the intense cellulitis in his left lower calf is a lot better. The wound area on the posterior left calf is also so better. However he has reasonably extensive wounds on the dorsal aspect of his second and third toes and the proximal foot just at the base of the toes. There is nothing open on the right leg 09/22/2018; the patient has excellent edema control in his legs bilaterally. He is using his external compression pumps twice a day. He has no open area on the right leg and only the areas in the left foot dorsally second and third toe area on the left side. He does not have any signs of active cellulitis. 10/06/2018; the patient has good edema control bilaterally. He has no open wound on the right leg. There is a blister in the posterior aspect of his left calf that we had to deal with today. He is using his compression pumps twice a day. There is no signs of active cellulitis. We have been using silver  alginate to the wound areas. He still has vulnerable areas on the base of his left first second toes dorsally He has a his extremities  stockings and we are going to transition him today into the stocking on the right leg. He is cautioned that he will need to continue to use the compression pumps twice a day. If he notices uncontrolled edema in the right leg he may need to go to 3 times a day. 10/13/2018; the patient came in for a nurse check on Friday he has a large flaccid blister on the right medial calf just below the knee. We unroofed this. He has this and a new area underneath the posterior mid calf which was undoubtedly a blister as well. He also has several small areas on the right which is the area we put his extremities stocking on. 10/19/2018; the patient went to see infectious disease this morning I am not sure if that was a routine follow-up in any case the doxycycline I had given him was discontinued and started on linezolid. He has not started this. It is easy to look at his left calf and the inflammation and think this is cellulitis however he is very tender in the tissue just below the popliteal fossa and I have no doubt that there is infection going on here. He states the problem he is having is that with the compression pumps the edema goes down and then starts walking the wrap falls down. We will see if we can adhere this. He has 1 or 2 minuscule open areas on the right still areas that are weeping on the posterior left calf, the base of his left second and third toes 10/26/18; back today in clinic with quite of skin breakdown in his left anterior leg. This may have been infection the area below the popliteal fossa seems a lot better however tremendous epithelial loss on the left anterior mid tibia area over quite inexpensive tissue. He has 2 blisters on the right side but no other open wound here. 10/29/2018; came in urgently to see [place] today and we worked him in for review. He  states that the 4 layer compression on the right leg caused pain he had to cut it down to roughly his mid calf this caused swelling above the wrap and he has blisters and skin breakdown today. As a result of the pain he has not been using his pumps. Both legs are a lot more edematous and there is a lot of weeping fluid. 11/02/18; arrives in clinic with continued difficulties in the right leg> left. Leg is swollen and painful. multiple skin blisters and new open areas especially laterally. He has not been using his pumps on the right leg. 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 He is not systemically unwell. 11/09/2018; the patient claims he is being compliant with his pumps. He is finished the doxycycline I gave him last week. Culture I did of the wound on the right lateral leg showed a few very resistant methicillin staph aureus. This was resistant to doxycycline. Nevertheless he states the pain in the leg is a lot better which makes me wonder if the cultured organism was not really what was causing the problem nevertheless this is a very dangerous organism to be culturing out of any wound. His right leg is still a lot larger than the left. He is using an Radio broadcast assistant on this area, he blames a 4-layer compression for causing the original skin breakdown which I doubt is true however I cannot talk him out of it. We have been using silver alginate to all  of these areas which were initially blisters 11/16/2018; patient is being compliant with his external compression pumps at twice a day. Miraculously he arrives in clinic today with absolutely no open wounds. He has better edema control on the left where he has been using 4 layer compression versus wound of wounds on the right and I pointed this out to him. There is no inflammation in the skin in his lower legs which is also somewhat unusual for him. There is no open wounds on the dorsal left foot. He  has extremitease stockings at home and I have asked him to bring these in next week. 11/25/18 patient's lower extremity on examination today on the left appears for the most part to be wound free. He does have an open wound on the lateral aspect of the right lower extremity but this is minimal compared to what I've seen in past. He does request that we go ahead and wrap the left leg as well even though there's nothing open just so hopefully it will not reopen in short order. 1/28; patient has superficial open wounds on the right lateral calf left anterior calf and left posterior calf. His edema control is adequate. 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 We have been using silver alginate as the primary dressing. He claims compliance with his compression pumps 2/4; patient has superficial open wounds on numerous areas of his left calf and again one on the left dorsal foot. The areas on the right lateral calf have healed. The cellulitis that I gave him doxycycline for last week is also resolved this was mostly on the left anterior calf just below the tibial tuberosity. His edema looks fairly well-controlled. He tells me he went to see his primary doctor today and had blood work ordered 2/11; once again he has several open areas on the left calf left tibial area. Most of these are small and appear to have healthy granulation. He does not have anything open on the right. The edema and control in his thighs is pretty good which is usually a good indication he has been using his pumps as requested. 2/18; he continues to have several small areas on the left calf and left tibial area. Most of these are small healthy granulation. We put him in his stocking on the right leg last week and he arrives with a superficial open area over the right upper tibia and a fairly large area on the right lateral tibia in similar  condition. His edema control actually does not look too bad, he claims to be using his compression pumps twice a day 2/25. Continued small areas on the left calf and left tibial area. New areas especially on the right are identified just below the tibial tuberosity and on the right upper tibia itself. There are also areas of weeping edema fluid even without an obvious wound. He does not have a considerable degree of lymphedema but clearly there is more edema here than his skin can handle. He states he is using the pumps twice a day. We have an Unna boot on the right and 4 layer compression on the left. 3/3; he continues to have an area on the right lateral calf and right posterior calf just below the popliteal fossa. There is a fair amount of tenderness around the wound on the popliteal fossa but I did not see any evidence of cellulitis, could just be  that the wrap came down and rubbed in this area. ooHe does not have an open area on the left leg however there is an area on the left dorsal foot at the base of the third toe ooWe have been using silver alginate to all wound areas 3/10; he did not have an open area on his left leg last time he was here a week ago. T oday he arrives with a horizontal wound just below the tibial tuberosity and an area on the left lateral calf. He has intense erythema and tenderness in this area. The area is on the right lateral calf and right posterior calf better than last week. We have been using silver alginate as usual 3/18 - Patient returns with 3 small open areas on left calf, and 1 small open area on right calf, the skin looks ok with no significant erythema, he continues the UNA boot on right and 4 layer compression on left. The right lateral calf wound is closed , the right posterior is small area. we will continue silver alginate to the areas. 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 He is been tolerating the dressing changes without complication. Fortunately she has made excellent improvement in regard to the overall status of his wounds. Unfortunately every time we cease wrapping him he ends up reopening in causing more significant issues at that point. Again I'm unsure of the best direction to take although I think the lymphedema clinic may be appropriate for him. 02/03/19 on evaluation today patient appears to be doing well in regard to the wounds that we saw him for last week unfortunately he has a new area on the proximal portion of his right medial/posterior lower extremity where the wrap somewhat slowed down and caused swelling and a blister to rub and open. Unfortunately this is the only opening that he has on either leg at this point. 02/17/19 on evaluation today patient's bilateral lower extremities appear to be doing well. He still completely healed in regard to the left lower extremity. In regard to the right lower extremity the area where the wrap and slid down and caused the blister still seems to be slightly open although this is dramatically better than during the last evaluation two weeks ago. I'm very pleased with the way this stands overall. 03/03/19 on evaluation today patient appears to be doing well in regard to his right lower extremity in general although he did have a new blister open this does not appear to be showing any evidence of active infection at this time. Fortunately there's No fevers, chills, nausea, or vomiting noted at this time. Overall I feel like he is making good progress it does feel like that the right leg will we perform the [employer] seems to do with a bit better than three layer wrap on the left which slid down on him. We may switch to doing bilateral in the book wraps. 5/4; I have not seen Mr. Martelle in quite  some time. According to our case manager he did not have an open wound on his left leg last week. He had 1 remaining wound on the right posterior medial calf. He arrives today with multiple openings on the left leg probably were blisters and/or wrap injuries from Unna boots. I do not think the [employer]  boot's will provide adequate compression on the left. I am also not clear about the frequency he is using the compression pumps. 03/17/19 on evaluation today patient appears to be doing excellent in regard to his lower extremities compared to last week's evaluation apparently. He had gotten significantly worse last week which is unfortunate. The [employer] wrap on the left did not seem to do very well for him at all and in fact it didn't control his swelling significantly enough he had an additional outbreak. Subsequently we go back to the four layer compression wrap on the left. This is good news. At least in that he is doing better and the wound seem to be killing him. He still has not heard anything from the lymphedema clinic. 03/24/19 on evaluation today patient actually appears to be doing much better in regard to his bilateral lower Trinity as compared to last week when I saw him. Fortunately there's no signs of active infection at this time. He has been tolerating the dressing changes without complication. Overall I'm extremely pleased with the progress and appearance in general. 04/07/19 on evaluation today patient appears to be doing well in regard to his bilateral lower extremities. His swelling is significantly down from where it was previous. With that being said he does have a couple blisters still open at this point but fortunately nothing that seems to be too severe and again the majority of the larger openings has healed at this time. 04/14/19 on evaluation today patient actually appears to be doing quite well in regard to his bilateral lower extremities in fact I'm not even sure there's  anything significantly open at this time at any site. Nonetheless he did have some trouble with these wraps where they are somewhat irritating him secondary to the fact that he has noted that the graph wasn't too close down to the end of this foot in a little bit short as well up to his knee. Otherwise things seem to be doing quite well. 04/21/19 upon evaluation today patient's wound bed actually showed evidence of being completely healed in regard to both lower extremities which is excellent news. There does not appear to be any signs of active infection which is also good news. I'm very pleased in this regard. No fevers, chills, nausea, or vomiting noted at this time. 04/28/19 on evaluation today patient appears to be doing a little bit worse in regard to both lower extremities on the left mainly due to the fact that when he went infection disease the wrap was not wrapped quite high enough he developed a blister above this. On the right he is a small open area of nothing too significant but again this is continuing to give him some trouble he has been were in the Velcro compression that he has at home. 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 He's been tolerating the dressing changes without complication. Fortunately there's no signs of active infection at this time. No fevers, chills, nausea, or vomiting noted at this time. We have been trying to get an appointment with her lymphedema clinic in [place] [facility] but unfortunately nobody can get them on phone with not been able to even fax information over the patient likewise is not been able to get in touch with them. Overall I'm not sure exactly what's going on here with to reach out again today. 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 Still having a lot of drainage unfortunately. He tells  me especially in the left but even on the right. There's no signs of active infection which is good news we've been using so ratcheted up to this point. 05/19/19 on evaluation today patient actually appears to be doing quite well with regard to his left lower extremity which is great news. Fortunately in regard to the right lower extremity has an issues with his wrap and he subsequently did remove this from what I'm understanding. Nonetheless long story short is what he had rewrapped once he removed it subsequently had maggots underneath this wrap whenever he came in for evaluation today. With that being said they were obviously completely cleaned away by the nursing staff. The visit today which is excellent news. However he does appear to potentially have some infection around the right ankle region where the maggots were located as well. He will likely require anabiotic therapy today. 05/26/19 on evaluation today patient actually appears to be doing much better in regard to his bilateral lower extremities. I feel like the infection is under much better control. With that being said there were maggots noted when the wrap was removed yet again today. Again this could have potentially been left over from previous although at this time there does not appear to be any signs of significant drainage there was obviously on the wrap some drainage as well this contracted gnats or otherwise. Either way I do not see anything that appears to be doing worse in my pinion and in fact I think his drainage has slowed down quite significantly likely mainly due to the fact to his infection being under better control. 06/02/2019 on evaluation today patient actually appears to be doing well with regard to his bilateral lower extremities there is no signs of active infection at this time which is great news. With that being said he does have several open areas more so on the right than the left but nonetheless these are all  significantly better than previously noted. 06/09/2019 on evaluation today patient actually appears to be doing well. His wrap stayed up and he did not cause any problems he had more drainage on the right compared to the left but overall I do not see any major issues at this time which is great news. 06/16/2019 on evaluation today patient appears to be doing excellent with regard to his lower extremities the only area that is open is a new blister that can have opened as of today on the medial ankle on the left. Other than this he really seems to be doing great I see no major issues at this point. 06/23/2019 on evaluation today patient appears to be doing quite well with regard to his bilateral lower extremities. In fact he actually appears to be almost completely healed there is a small area of weeping noted of the right lower extremity just above the ankle. Nonetheless fortunately there is no signs of active infection at this time which is good news. No fevers, chills, nausea, vomiting, or diarrhea. 8/24; the patient arrived for a nurse visit today but complained of very significant pain in the left leg and therefore I was asked to look at this. Noted that he did not have an open area on the left leg last week nevertheless this was wrapped. The patient states that he is not been able to put his compression pumps on the left leg because of the  discomfort. He has not been systemically unwell 06/30/2019 on evaluation today patient unfortunately despite being excellent last week is doing much worse with regard to his left lower extremity today. In fact he had to come in for a nurse on Monday where his left leg had to be rewrapped due to excessive weeping Dr. Leanord Hawking placed him on doxycycline at that point. Fortunately there is no signs of active infection Systemically at this time which is good news. 07/07/2019 in regard to the patient's wounds today he actually seems to be doing well with his right lower  extremity there really is nothing open or draining at this point this is great news. Unfortunately the left lower extremity is given him additional trouble at this time. There does not appear to be any signs of active infection nonetheless he does have a lot of edema and swelling noted at this point as well as blistering all of which has led to a much more poor appearing leg at this time compared to where it was 2 weeks ago when it was almost completely healed. Obviously this is a little discouraging for the patient. He is try to contact the lymphedema clinic in [place] he has not been able to get through to them. 07/14/2019 on evaluation today patient actually appears to be doing slightly better with regard to his left lower extremity ulcers. Overall I do feel like at least at the top of the wrap that we have been placing this area has healed quite nicely and looks much better. The remainder of the leg is showing signs of improvement. Unfortunately in the thigh area he still has an open region on the left and again on the right he has been utilizing just a Band-Aid on an area that also opened on the thigh. Again this is an area that were not able to wrap although we did do an Ace wrap to provide some compression that something that obviously is a little less effective than the compression wraps we have been using on the lower portion of the leg. He does have an appointment with the lymphedema clinic in [facility] on Friday. 07/21/2019 on evaluation today patient appears to be doing better with regard to his lower extremity ulcers. He has been tolerating the dressing changes without complication. Fortunately there is no signs of active infection at this time. No fevers, chills, nausea, vomiting, or diarrhea. I did receive the paperwork from the physical therapist at the lymphedema clinic in [place]. Subsequently I signed off on that this morning and sent that back to him for  further progression with the treatment plan. 07/28/2019 on evaluation today patient appears to be doing very well with regard to his right lower extremity where I do not see any open wounds at this point. Fortunately he is feeling great as far as that is concerned as well. In regard to the left lower extremity he has been having issues with still several areas of weeping and edema although the upper leg is doing better his lower leg still I think is going require the compression wrap at this time. No fevers, chills, nausea, vomiting, or diarrhea. 08/04/2019 on evaluation today patient unfortunately is having new wounds on the right lower extremity. Again we have been using Unna boot wrap on that side. We switched him to using his juxta lite wrap at home. With that being said he tells me he has been using it although his legs extremely swollen and to be honest really does not appear  that he has been. I cannot know that for sure however. Nonetheless he has multiple new wounds on the right lower extremity at this time. Obviously we will have to see about getting this rewrapped for him today. 08/11/2019 on evaluation today patient appears to be doing fairly well with regard to his wounds. He has been tolerating the dressing changes including the compression wraps without complication. He still has a lot of edema in his upper thigh regions bilaterally he is supposed to be seeing the lymphedema clinic on the 15th of this month once his wraps arrive for the upper part of his legs. 08/18/2019 on evaluation today patient appears to be doing well with regard to his bilateral lower extremities at this point. He has been tolerating the dressing changes without complication. Fortunately there is no signs of active infection which is also good news. He does have a couple weeping areas on the first and second toe of the right foot he also has just a small area on the left foot upper leg and a small area on the left  lower leg but overall he is doing quite well in my opinion. He is supposed to be getting his wraps shortly in fact tomorrow and then subsequently is seeing the lymphedema clinic next Wednesday on the 21st. Of note he is also leaving on the 25th to go on vacation for a week to the beach. For that reason and since there is some uncertainty about what there can be doing at lymphedema clinic next Wednesday I am get a make an appointment for next Friday here for [place] to see what we need to do for him prior to him leaving for vacation. 10/23; patient arrives in considerable pain predominantly in the upper posterior calf just distal to the popliteal fossa also in the wound anteriorly above the major wound. This is probably cellulitis and he has had this recurrently in the past. He has no open wound on the right side and he has had an Radio broadcast assistant in that area. Finally I note that he has an area on the left posterior calf which by enlarge is mostly epithelialized. This protrudes beyond the borders of the surrounding skin in the setting of dry scaly skin and lymphedema. The patient is leaving for [facility] on Sunday. Per his longstanding pattern, he will not take his compression pumps with him predominantly out of fear that they will be stolen. He therefore asked that we put a Unna boot back on the right leg. He will also contact the wound care center in [facility] to see if they can change his dressing in the mid week. 11/3; patient returned from his vacation to [facility]. He was seen on 1 occasion at their wound care center. They did a 2 layer compression system as they did not have our 4-layer wrap. I am not completely certain what they put on the wounds. They did not change the Unna boot on the right. The patient is also seeing a lymphedema specialist physical therapist in [place]. It appears that he has some compression sleeve for his thighs which indeed look quite a bit better than I am used to  seeing. He pumps over these with his external compression pumps. 11/10; the patient has a new wound on the right medial thigh otherwise there is no open areas on the right. He has an area on the left leg posteriorly anteriorly and medially and an area over the left second toe. We have been  using silver alginate. He thinks the injury on his thigh is secondary to friction from the compression sleeve he has. 11/17; the patient has a new wound on the right medial thigh last week. He thinks this is because he did not have a underlying stocking for his thigh juxta lite apparatus. He now has this. The area is fairly large and somewhat angry but I do not think he has underlying cellulitis. ooHe has a intact blister on the right anterior tibial area. ooSmall wound on the right great toe dorsally ooSmall area on the medial left calf. 11/30; the patient does not have any open areas on his right leg and we did not take his juxta lite stocking off. However he states that on Friday his compression wrap fell down lodging around his upper mid calf area. As usual this creates a lot of problems for him. He called urgently today to be seen for a nurse visit however the nurse visit turned into a provider visit because of extreme erythema and pain in the left anterior tibia extending laterally and posteriorly. The area that is problematic is extensive 10/06/2019 upon evaluation today patient actually appears to be doing poorly in regard to his left lower extremity. He Dr. Leanord Hawking did place him on doxycycline this past Monday apparently due to the fact that he was doing much worse in regard to this left leg. Fortunately the doxycycline does seem to be helping. Unfortunately we are still having a very difficult time getting his edema under any type of control in order to anticipate discharge at some point. The only way were really able to control his lymphedema really is with compression wraps and that has only even  seemingly temporary. He has been seeing a lymphedema clinic they are trying to help in this regard but still this has been somewhat frustrating in general for the patient. 10/13/19 on evaluation today patient appears to be doing excellent with regard to his right lower extremity as far as the wounds are concerned. His swelling is still quite extensive unfortunately. He is still having a lot of drainage from the thigh areas bilaterally which is unfortunate. He's been going to lymphedema clinic but again he still really does not have this edema under control as far as his lower extremities are concern. With regard to his left lower extremity this seems to be improving and I do believe the doxycycline has been of benefit for him. He is about to complete the doxycycline. 10/20/2019 on evaluation today patient appears to be doing poorly in regard to his bilateral lower extremities. More in the right thigh he has a lot of irritation at this site unfortunately. In regard to the left lower extremity the wrap was not quite as high it appears and does seem to have caused him some trouble as well. Fortunately there is no evidence of systemic infection though he does have some blue-green drainage which has me concerned for the possibility of Pseudomonas. He tells me he is previously taking Cipro without complications and he really does not care for Levaquin however due to some of the side effects he has. He is not allergic to any medications specifically antibiotics that were aware of. 10/27/2019 on evaluation today patient actually does appear to be for the most part doing better when compared to last week's evaluation. With that being said he still has multiple open wounds over the bilateral lower extremities. He actually forgot to start taking the Cipro and states that he still has  the whole bottle. He does have several new blisters on left lower extremity today I think I would recommend he go ahead and take the  Cipro based on what I am seeing at this point. 12/30-Patient comes at 1 week visit, 4 layer compression wraps on the left and Unna boot on the right, primary dressing Xtrasorb and silver alginate. Patient is taking his Cipro and has a few more days left probably 5-6, and the legs are doing better. He states he is using his compressions devices which I believe he has 11/10/2019 on evaluation today patient actually appears to be much better than last time I saw him 2 weeks ago. His wounds are significantly improved and overall I am very pleased in this regard. Fortunately there is no signs of active infection at this time. He is just a couple of days away from completing Cipro. Overall his edema is much better he has been using his lymphedema pumps which I think is also helping at this point. 11/17/2019 on evaluation today patient appears to be doing excellent in regard to his wounds in general. His legs are swollen but not nearly as much as they have been in the past. Fortunately he is tolerating the compression wraps without complication. No fevers, chills, nausea, vomiting, or diarrhea. He does have some erythema however in the distal portion of his right lower extremity specifically around the forefoot and toes there is a little bit of warmth here as well. 11/24/2019 on evaluation today patient appears to be doing well with regard to his right lower extremity I really do not see any open wounds at this point. His left lower extremity does have several open areas and his right medial thigh also is open. Other than this however overall the patient seems to be making good progress and I am very pleased at this point. 12/01/2019 on evaluation today patient appears to be doing poorly at this point in regard to his left lower extremity has several new blisters despite the fact that we have him in compression wraps. In fact he had a 4-layer compression wrap, his upper thigh wrapped from lymphedema clinic, and a  juxta light over top of the 4 layer compression wrap the lymphedema clinic applied and despite all this he still develop blisters underneath. Obviously this does have me concerned about the fact that unfortunately despite what we are doing to try to get wounds healed he continues to have new areas arise I do not think he is ever good to be at the point where he can realistically just use wraps at home to keep things under control. Typically when we heal him it takes about 1-2 days before he is back in the clinic with severe breakdown and blistering of his lower extremities bilaterally. This is happened numerous times in the past. Unfortunately I think that we may need some help as far as overall fluid overload to kind of limit what we are seeing and get things under better control. 12/08/2019 on evaluation today patient presents for follow-up concerning his ongoing bilateral lower extremity edema. Unfortunately he is still having quite a bit of swelling the compression wraps are controlling this to some degree but he did see Dr. [name] his cardiologist I do have that available for review today as far as the appointment was concerned that was on 12/06/2019. Obviously that she has been 2 days ago. The patient states that he is only been taking the Lasix 80 mg 1 time a day he  had told me previously he was taking this twice a day. Nonetheless Dr. [name] recommended this be up to 80 mg 2 times a day for the patient as he did appear to be fluid overloaded. With that being said the patient states he did this yesterday and he was unable to go anywhere or do anything due to the fact that he was constantly having to urinate. Nonetheless I think that this is still good to be something that is important for him as far as trying to get his edema under control at all things that he is going to be able to just expect his wounds to get under control and things to be better without going through at least a period of time  where he is trying to stabilize his fluid management in general and I think increasing the Lasix is likely the first step here. It was also mentioned the possibility that the patient may require metolazone. With that being said he wanted to have the patient take Lasix twice a day first and then reevaluating 2 months to see where things stand. 12/15/2019 upon evaluation today patient appears to be doing regard to his legs although his toes are showing some signs of weeping especially on the left at this point to some degree on the right. There does not appear to be any signs of active infection and overall I do feel like the compression wraps are doing well for him but he has not been able to take the Lasix at home and the increased dose that Dr. [name] recommended. He tells me that just not go to be feasible for him. Nonetheless I think in this case he should probably send a message to Dr. [name] in order to discuss options from the standpoint of possible admission to get the fluid off or otherwise going forward. 12/22/2019 upon evaluation today patient appears to be doing fairly well with regard to his lower extremities at this point. In fact he would be doing excellent if it was not for the fact that his right anterior thigh apparently had an allergic reaction to adhesive tape that he used. The wound itself that we have been monitoring actually appears to be healed. There is a lot of irritation at this point. 12/29/2019 upon evaluation today patient appears to be doing well in regard to his lower extremities. His left medial thigh is open and somewhat draining today but this is the only region that is open the right has done much better with the treatment utilizing the steroid cream that I prescribed for him last week. Overall I am pleased in that regard. Fortunately there is no signs of active infection at this time. No fevers, chills, nausea, vomiting, or diarrhea. 01/05/2020 upon evaluation today patient  appears to be doing more poorly in regard to his right lower extremity at this point upon evaluation today. Unfortunately he continues to have issues in this regard and I think the biggest issue is controlling his edema. This obviously is not very well controlled at this point is been recommended that he use the Lasix twice a day but he has not been able to do that. Unfortunately I think this is leading to an issue where honestly he is not really able to effectively control his edema and therefore the wounds really are not doing significantly better. I do not think that he is going to be able to keep things under good control unless he is able to control his edema much better.  I discussed this again in great detail with him today. 01/12/2020 good news is patient actually appears to be doing quite well today at this point. He does have an appointment with lymphedema clinic tomorrow. His legs appear healed and the toe on the left is almost completely healed. In general I am very pleased with how things stand at this point. 01/19/2020 upon evaluation today patient appears to actually be doing well in regard to his lower extremities there is nothing open at this point. Fortunately he has done extremely well more recently. Has been seeing lymphedema clinic as well. With that being said he has Velcro wraps for his lower legs as well as his upper legs. The only wound really is on his toe which is the right great toe and this is barely anything even there. With all that being said I think it is good to be appropriate today to go ahead and switch him over to the Velcro compression wraps. 01/26/2020 upon evaluation today patient appears to be doing worse with regard to his lower extremities after last week switch him to Velcro compression wraps. Unfortunately he lasted less than 24 hours he did not have the sock portion of his Velcro wrap on the left leg and subsequently developed a blister underneath the Velcro  portion. Obviously this is not good and not what we were looking for at this point. He states the lymphedema clinic did tell him to wear the wrap for 23 hours and take him off for 1 I am okay with that plan but again right now we got a get things back under control again he may have some cellulitis noted as well. 02/02/2020 upon evaluation today patient unfortunately appears to have several areas of blistering on his bilateral lower extremities today mainly on the feet. His legs do seem to be doing somewhat better which is good news. Fortunately there is no evidence of active infection at this time. No fevers, chills, nausea, vomiting, or diarrhea. 02/16/2020 upon evaluation today patient appears to be doing well at this time with regard to his legs. He has a couple weeping areas on his toes but for the most part everything is doing better and does appear to be sealed up on his legs which is excellent news. We can continue with wrapping him at this point as he had every time we discontinue the wraps he just breaks out with new wounds. There is really no point in is going forward with this at this point. 03/08/2020 upon evaluation today patient actually appears to be doing quite well with regard to his lower extremity ulcers. He has just a very superficial and really almost nonexistent blister on the left lower extremity he has in general done very well with the compression wraps. With that being said I do not see any signs of infection at this time which is good news. 03/29/2020 upon evaluation today patient appears to be doing well with regard to his wounds currently except for where he had several new areas that opened up due to some of the wrap slipping and causing him trouble. He states he did not realize they had slipped. Nonetheless he has a 1 area on the right and 3 new areas on the left. Fortunately there is no signs of active infection at this time which is great news. 04/05/2020 upon evaluation  today patient actually appears to be doing quite well in general in regard to his legs currently. Fortunately there is no signs of active  infection at this time. No fevers, chills, nausea, vomiting, or diarrhea. He tells me next week that he will actually be seen in the lymphedema clinic on Thursday at 10 AM I see him on Wednesday next week. 04/12/2020 upon evaluation today patient appears to be doing very well with regard to his lower extremities bilaterally. In fact he does not appear to have any open wounds at this point which is good news. Fortunately there is no signs of active infection at this time. No fevers, chills, nausea, vomiting, or diarrhea. 04/19/2020 upon evaluation today patient appears to be doing well with regard to his wounds currently on the bilateral lower extremities. There does not appear to be any signs of active infection at this time. Fortunately there is no evidence of systemic infection and overall very pleased at this point. Nonetheless after I held him out last week he literally had blisters the next morning already which swelled up with him being right back here in the clinic. Overall I think that he is just not can be able to be discharged with his legs the way they are he is much to volume overloaded as far as fluid is concerned and that was discussed with him today of also discussed this but should try the clinic nurse manager as well as Dr. Leanord Hawking. 04/26/2020 upon evaluation today patient appears to be doing better with regard to his wounds currently. He is making some progress and overall swelling is under good control with the compression wraps. Fortunately there is no evidence of active infection at this time. 05/10/2020 on evaluation today patient appears to be doing overall well in regard to his lower extremities bilaterally. He is Tolerating the compression wraps without complication and with what we are seeing currently I feel like that he is making excellent  progress. There is no signs of active infection at this time. 05/24/2020 upon evaluation today patient appears to be doing well in regard to his legs. The swelling is actually quite a bit down compared to where it has been in the past. Fortunately there is no sign of active infection at this time which is also good news. With that being said he does have several wounds on his toes that have opened up at this point. 05/31/2020 upon evaluation today patient appears to be doing well with regard to his legs bilaterally where he really has no significant fluid buildup at this point overall he seems to be doing quite well. Very pleased in this regard. With regard to his toes these also seem to be drying up which is excellent. We have continue to wrap him as every time we tried as a transition to the juxta light wraps things just do not seem to get any better. 06/07/2020 upon evaluation today patient appears to be doing well with regard to his right leg at this point. Unfortunately left leg has a lot of blistering he tells me the wrap started to slide down on him when he tried to put his other Velcro wrap over top of it to help keep things in order but nonetheless still had some issues. 06/14/2020 on evaluation today patient appears to be doing well with regard to his lower extremity ulcers and foot ulcers at this point. I feel like everything is actually showing signs of improvement which is great news overall there is no signs of active infection at this time. No fevers, chills, nausea, vomiting, or diarrhea. 06/21/2020 on evaluation today patient actually appears to be doing  okay in regard to his wounds in general. With that being said the biggest issue I see is on his right foot in particular the first and second toe seem to be doing a little worse due to the fact this is staying very wet. I think he is probably getting need to change out his dressings a couple times in between each week when we see him in  regard to his toes in order to keep this drier based on the location and how this is proceeding. 06/28/2020 on evaluation today patient appears to be doing a little bit more poorly overall in regard to the appearance of the skin I am actually somewhat concerned about the possibility of him having a little bit of an infection here. We discussed the course of potentially giving him a doxycycline prescription which he is taken previously with good result. With that being said I do believe that this is potentially mild and at this point easily fixed. I just do not want anything to get any worse. 07/12/2020 upon evaluation today patient actually appears to be making some progress with regard to his legs which is great news there does not appear to be any evidence of active infection. Overall very pleased with where things stand. 07/26/2020 upon evaluation today patient appears to be doing well with regard to his leg ulcers and toe ulcers at this point. He has been tolerating the compression wraps without complication overall very pleased in this regard. 08/09/2020 upon evaluation today patient appears to be doing well with regard to his lower extremities bilaterally. Fortunately there is no signs of active infection overall I am pleased with where things stand. 08/23/2020 on evaluation today patient appears to be doing well with regard to his wound. He has been tolerating the dressing changes without complication. Fortunately there is no signs of active infection at this time. Overall his legs seem to be doing quite well which is great news and I am very pleased in that regard. No fevers, chills, nausea, vomiting, or diarrhea. 09/13/2020 upon evaluation today patient appears to be doing okay in regard to his lower extremities. He does have a fairly large blister on the right leg which I did remove the blister tissue from today so we can get this to dry out other than that however he seems to be doing quite  well. There is no signs of active infection at this time. 09/27/2020 upon evaluation today patient appears to actually be doing some better in regard to his right leg. Fortunately signs of active infection at this time which is great news. No fevers, chills, nausea, vomiting, or diarrhea. 10/04/2020 upon evaluation today patient actually appears to be showing signs of improvement which is great news with regard to his leg ulcers. Fortunately there is no signs of active infection which is great news he is still taking the antibiotics currently. No fevers, chills, nausea, vomiting, or diarrhea. 10/18/2020 on evaluation today patient appears to be doing well with regard to his legs currently. He has been tolerating the dressing changes including the wraps without complication. Fortunately there is no signs of active infection at this time. No fevers, chills, nausea, vomiting, or diarrhea. 10/25/2020 upon evaluation today patient appears to be doing decently well in regard to his wounds currently. He has been tolerating the dressing changes without complication. Overall I feel like he is making good progress albeit slow. Again this is something we can have to continue to wrap for some time to come  most likely. 11/08/2020 upon evaluation today patient appears to be doing well with regard to his wounds currently. He has been tolerating the dressing changes without complication is not currently on any antibiotics and he does not appear to show any signs of infection. He does continue to have a lot of drainage on the right leg not too severe but nonetheless this is very scattered. On the left leg this is looking to be much improved overall. 11/15/2020 upon evaluation today patient appears to be doing better with regard to his legs bilaterally. Especially the right leg which was much more significant last week. There does not appear to be any signs of active infection which is great news. No fevers, chills,  nausea, vomiting, or diarrhea. 11/23/2019 upon evaluation today patient appears to be doing poorly still in regard to his lower extremities bilaterally. Unfortunately his right leg in particular appears to be doing much more poorly there is no signs really of infection this is not warm to touch but he does have a lot of drainage and weeping unfortunately. With that reason I do believe that we may need to initiate some treatment here to try to help calm down some of the swelling of the right leg. I think switching to a 4-layer compression wrap would be beneficial here. The patient is in agreement with giving this a try. 11/29/2020 upon evaluation today patient appears to be doing well currently in regard to his leg ulcers. I feel like the right leg is doing better he still has a lot of drainage but we do see some improvement here. The 4-layer compression wrap I think was helpful. 12/06/2020 upon evaluation today patient appears to be doing well with regard to his legs. In fact they seem to be doing about the best I have seen up to this point. Fortunately there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

## 2021-04-04 NOTE — Progress Notes (Signed)
[name], [name] ([id number]) Visit Report for 03/28/2021 SuperBill Details Patient Name: Date of Service: CO [name], [name] 03/28/2021 Medical Record Number: [id number] Patient Account Number: [id number] Date of Birth/Sex: Treating RN: 12-10-1950 (70 y.o. 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 Electronic Signature(s) Signed: 03/28/2021 4:06:25 PM By: Shawn Stall Signed: 04/04/2021 9:55:24 AM By: Geralyn Corwin DO Entered By: Shawn Stall on 03/28/2021 12:05:50

## 2021-04-05 ENCOUNTER — Other Ambulatory Visit: Payer: Self-pay

## 2021-04-05 DIAGNOSIS — R7989 Other specified abnormal findings of blood chemistry: Secondary | ICD-10-CM

## 2021-04-05 LAB — CBC WITH DIFFERENTIAL/PLATELET
Absolute Monocytes: 721 cells/uL (ref 200–950)
Basophils Absolute: 49 cells/uL (ref 0–200)
Basophils Relative: 0.6 %
Eosinophils Absolute: 324 cells/uL (ref 15–500)
Eosinophils Relative: 4 %
HCT: 43.6 % (ref 38.5–50.0)
Hemoglobin: 13.4 g/dL (ref 13.2–17.1)
Lymphs Abs: 1231 cells/uL (ref 850–3900)
MCH: 26.1 pg — ABNORMAL LOW (ref 27.0–33.0)
MCHC: 30.7 g/dL — ABNORMAL LOW (ref 32.0–36.0)
MCV: 84.8 fL (ref 80.0–100.0)
MPV: 10.5 fL (ref 7.5–12.5)
Monocytes Relative: 8.9 %
Neutro Abs: 5775 cells/uL (ref 1500–7800)
Neutrophils Relative %: 71.3 %
Platelets: 236 10*3/uL (ref 140–400)
RBC: 5.14 10*6/uL (ref 4.20–5.80)
RDW: 15.3 % — ABNORMAL HIGH (ref 11.0–15.0)
Total Lymphocyte: 15.2 %
WBC: 8.1 10*3/uL (ref 3.8–10.8)

## 2021-04-05 LAB — COMPREHENSIVE METABOLIC PANEL
AG Ratio: 1.2 (calc) (ref 1.0–2.5)
ALT: 11 U/L (ref 9–46)
AST: 15 U/L (ref 10–35)
Albumin: 3.8 g/dL (ref 3.6–5.1)
Alkaline phosphatase (APISO): 72 U/L (ref 35–144)
BUN/Creatinine Ratio: 13 (calc) (ref 6–22)
BUN: 20 mg/dL (ref 7–25)
CO2: 26 mmol/L (ref 20–32)
Calcium: 8.9 mg/dL (ref 8.6–10.3)
Chloride: 99 mmol/L (ref 98–110)
Creat: 1.57 mg/dL — ABNORMAL HIGH (ref 0.70–1.25)
Globulin: 3.1 g/dL (calc) (ref 1.9–3.7)
Glucose, Bld: 115 mg/dL — ABNORMAL HIGH (ref 65–99)
Potassium: 4.6 mmol/L (ref 3.5–5.3)
Sodium: 137 mmol/L (ref 135–146)
Total Bilirubin: 1 mg/dL (ref 0.2–1.2)
Total Protein: 6.9 g/dL (ref 6.1–8.1)

## 2021-04-05 LAB — MICROALBUMIN / CREATININE URINE RATIO
Creatinine, Urine: 181 mg/dL (ref 20–320)
Microalb Creat Ratio: 62 mcg/mg creat — ABNORMAL HIGH (ref <30)
Microalb, Ur: 11.2 mg/dL

## 2021-04-05 LAB — HEMOGLOBIN A1C
Hgb A1c MFr Bld: 7.5 % of total Hgb — ABNORMAL HIGH (ref <5.7)
Mean Plasma Glucose: 169 mg/dL
eAG (mmol/L): 9.3 mmol/L

## 2021-04-05 LAB — LIPID PANEL
Cholesterol: 101 mg/dL (ref <200)
HDL: 41 mg/dL (ref >=40)
LDL Cholesterol (Calc): 46 mg/dL (calc)
Non-HDL Cholesterol (Calc): 60 mg/dL (calc) (ref <130)
Total CHOL/HDL Ratio: 2.5 (calc) (ref <5.0)
Triglycerides: 61 mg/dL (ref <150)

## 2021-04-09 NOTE — Progress Notes (Signed)
[name] ([id number]) Visit Report for 04/04/2021 Arrival Information Details Patient Name: Date of Service: [name] 04/04/2021 10:30 A M Medical Record Number: [id number] Patient Account Number: [id number] Date of Birth/Sex: Treating RN: 1950-12-27 (70 y.o. [name] Primary Care Kemauri Musa: [name] Other Clinician: Referring Tyquez Hollibaugh: Treating Demetri Goshert/Extender: Jeannette Corpus in Treatment: 74 Visit Information History Since Last Visit Added or deleted any medications: No Patient Arrived: [name] new allergies or adverse reactions: No Arrival Time: 11:09 Had a fall or experienced change in No Accompanied By: self activities of daily living that may affect Transfer Assistance: None risk of falls: Patient Identification Verified: Yes Signs or symptoms of abuse/neglect since last visito No Secondary Verification Process Completed: Yes Hospitalized since last visit: No Patient Requires Transmission-Based Precautions: No Implantable device outside of the clinic excluding No Patient Has Alerts: Yes cellular tissue based products placed in the center since last visit: Has Dressing in Place as Prescribed: Yes Pain Present Now: Yes Electronic Signature(s) Signed: 04/05/2021 1:41:47 PM By: Sandre Kitty Entered By: Sandre Kitty on 04/04/2021 11:09:34 -------------------------------------------------------------------------------- Compression Therapy Details Patient Name: Date of Service: Kevin Nora, [name] 04/04/2021 10:30 A M Medical Record Number: [id number] Patient Account Number: [id number] Date of Birth/Sex: Treating RN: [birth year] (70 y.o. [name] Primary Care Herrick Hartog: [name] Other Clinician: Referring Assad Harbeson: Treating Madhav Mohon/Extender: Jeannette Corpus in Treatment: 271 Compression Therapy Performed for Wound Assessment: NonWound Condition Lymphedema - Right Leg Performed  By: Clinician Rhae Hammock, RN Compression Type: Four Layer Post Procedure Diagnosis Same as Pre-procedure Electronic Signature(s) Signed: 04/04/2021 6:06:42 PM By: Baruch Gouty RN, BSN Entered By: Baruch Gouty on 04/04/2021 11:26:33 -------------------------------------------------------------------------------- Compression Therapy Details Patient Name: Date of Service: Kevin Nora, [name] 04/04/2021 10:30 A M Medical Record Number: [id number] Patient Account Number: [id number] Date of Birth/Sex: Treating RN: 10/21/1951 (70 y.o. [name] Primary Care Zanyla Klebba: [name] Other Clinician: Referring Cledith Kamiya: Treating Cambell Rickenbach/Extender: Jeannette Corpus in Treatment: 271 Compression Therapy Performed for Wound Assessment: NonWound Condition Lymphedema - Left Leg Performed By: Clinician Rhae Hammock, RN Compression Type: Four Layer Post Procedure Diagnosis Same as Pre-procedure Electronic Signature(s) Signed: 04/04/2021 6:06:42 PM By: Baruch Gouty RN, BSN Entered By: Baruch Gouty on 04/04/2021 11:26:58 -------------------------------------------------------------------------------- Encounter Discharge Information Details Patient Name: Date of Service: [facility], [name] 04/04/2021 10:30 A M Medical Record Number: [id number] Patient Account Number: [id number] Date of Birth/Sex: Treating RN: 1951-02-13 (70 y.o. [name] Primary Care Shameca Landen: [name] Other Clinician: Referring Shauntel Prest: Treating Anastasiya Gowin/Extender: Jeannette Corpus in Treatment: [phone] Encounter Discharge Information Items Discharge Condition: Stable Ambulatory Status: Walker Discharge Destination: Home Transportation: Private Auto Accompanied By: self Schedule Follow-up Appointment: Yes Clinical Summary of Care: Electronic Signature(s) Signed: 04/04/2021 5:52:00 PM By: Deon Pilling Entered By: Deon Pilling on 04/04/2021  12:00:35 -------------------------------------------------------------------------------- Lower Extremity Assessment Details Patient Name: Date of Service: [name] 04/04/2021 10:30 A M Medical Record Number: [id number] Patient Account Number: [id number] Date of Birth/Sex: Treating RN: 07-Oct-1951 (70 y.o. [name], [name] Primary Care Sheera Illingworth: [name] Other Clinician: Referring Nejla Reasor: Treating Ayisha Pol/Extender: Jeannette Corpus in Treatment: 271 Edema Assessment Assessed: [name]: No] [name]: No] Edema: [Left: Yes] [Right: Yes] Calf Left: Right: Point of Measurement: 25 cm From Medial Instep 42 cm 37 cm Ankle Left: Right: Point of Measurement: 9 cm From Medial Instep 27 cm 26 cm Vascular Assessment Pulses: Dorsalis Pedis Palpable: [Left:Yes] [Right:Yes] Posterior Tibial  Palpable: [Left:Yes] [Right:Yes] Electronic Signature(s) Signed: 04/05/2021 12:00:56 PM By: Rhae Hammock RN Entered By: Rhae Hammock on 04/04/2021 11:17:58 -------------------------------------------------------------------------------- Multi Wound Chart Details Patient Name: Date of Service: Kevin [name], [name] 04/04/2021 10:30 A M Medical Record Number: [id number] Patient Account Number: [id number] Date of Birth/Sex: Treating RN: 1951/03/11 (70 y.o. [name] Primary Care Marionna Gonia: [name] Other Clinician: Referring Collie Kittel: Treating Alorah Mcree/Extender: Jeannette Corpus in Treatment: 271 Vital Signs Height(in): 70 Capillary Blood Glucose(mg/dl): 151 Weight(lbs): 380.2 Pulse(bpm): 23 Body Mass Index(BMI): 70 Blood Pressure(mmHg): 163/68 Temperature(F): 98.0 Respiratory Rate(breaths/min): 16 Photos: [193:No 51 Left T Second oe] [195:No Photos Left, Anterior Lower Leg] [196:No Photos Right T Fourth oe] Wound Location: [193:Gradually Appeared] [195:Skin Tear/Laceration] [196:Gradually Appeared] Wounding  Event: [193:Diabetic Wound/Ulcer of the Lower] [195:Skin Tear] [196:Diabetic Wound/Ulcer of the Lower] Primary Etiology: [193:Extremity Chronic sinus problems/congestion,] [195:N/A] [196:Extremity Chronic sinus problems/congestion,] Comorbid History: [193:Arrhythmia, Hypertension, Peripheral Arterial Disease, Type II Diabetes, History of Burn, Gout, Confinement Anxiety 01/31/2021] [195:02/14/2021] [196:Arrhythmia, Hypertension, Peripheral Arterial Disease, Type II Diabetes, History of  Burn, Gout, Confinement Anxiety 03/21/2021] Date Acquired: [193:9] [195:7] [196:2] Weeks of Treatment: [193:Open] [195:Healed - Epithelialized] [196:Open] Wound Status: [193:3.3x1.8x0.1] [195:0x0x0] [196:0.4x0.4x0.1] Measurements L x W x D (cm) [193:4.665] [195:0] [196:0.126] A (cm) : rea [193:0.467] [195:0] [196:0.013] Volume (cm) : [193:-75.70%] [195:100.00%] [196:70.30%] % Reduction in Area: [193:-76.20%] [195:100.00%] [196:69.00%] % Reduction in Volume: [193:Grade 2] [195:Full Thickness Without Exposed] [196:Grade 2] Classification: [193:Medium] [195:Support Structures N/A] [196:Medium] Exudate Amount: [193:Serosanguineous] [195:N/A] [196:Serosanguineous] Exudate Type: [193:red, brown] [195:N/A] [196:red, brown] Exudate Color: [193:Distinct, outline attached] [195:N/A] [196:Distinct, outline attached] Wound Margin: [193:Large (67-100%)] [195:N/A] [196:Large (67-100%)] Granulation Amount: [193:Red, Pink] [195:N/A] [196:Red, Pink] Granulation Quality: [193:Small (1-33%)] [195:N/A] [196:Small (1-33%)] Necrotic Amount: [193:Fascia: No] [195:N/A] [196:Fascia: No] Exposed Structures: [193:Fat Layer (Subcutaneous Tissue): No Tendon: No Muscle: No Joint: No Bone: No Small (1-33%)] [195:N/A] [196:Fat Layer (Subcutaneous Tissue): No Tendon: No Muscle: No Joint: No Bone: No Medium (34-66%)] Treatment Notes Electronic Signature(s) Signed: 04/04/2021 11:44:44 AM By: Kalman Shan DO Signed: 04/04/2021 6:06:42 PM By:  Baruch Gouty RN, BSN Entered By: Kalman Shan on 04/04/2021 11:34:41 -------------------------------------------------------------------------------- Multi-Disciplinary Care Plan Details Patient Name: Date of Service: [facility], [place] J. 04/04/2021 10:30 A M Medical Record Number: [id number] Patient Account Number: [id number] Date of Birth/Sex: Treating RN: 05-29-1951 (70 y.o. [name] Primary Care Soham Hollett: [name] Other Clinician: Referring Casimer Russett: Treating Deontaye Civello/Extender: Jeannette Corpus in Treatment: [place] reviewed with physician Active Inactive Venous Leg Ulcer Nursing Diagnoses: Actual venous Insuffiency (use after diagnosis is confirmed) Goals: Patient will maintain optimal edema control Date Initiated: 09/10/2016 Target Resolution Date: 05/02/2021 Goal Status: Active Verify adequate tissue perfusion prior to therapeutic compression application Date Initiated: 09/10/2016 Date Inactivated: 11/28/2016 Goal Status: Met Interventions: Assess peripheral edema status every visit. Compression as ordered Provide education on venous insufficiency Notes: Electronic Signature(s) Signed: 04/04/2021 6:06:42 PM By: Baruch Gouty RN, BSN Entered By: Baruch Gouty on 04/04/2021 11:30:51 -------------------------------------------------------------------------------- Pain Assessment Details Patient Name: Date of Service: Kevin [name], Claudie J. 04/04/2021 10:30 A M Medical Record Number: [id number] Patient Account Number: [id number] Date of Birth/Sex: Treating RN: [birth year] (70 y.o. [name] Primary Care Tranesha Lessner: [name] Other Clinician: Referring Dimitri Shakespeare: Treating Teala Daffron/Extender: Jeannette Corpus in Treatment: 271 Active Problems Location of Pain Severity and Description of Pain Patient Has Paino Yes Site Locations Rate the pain. Current Pain Level: 3 Pain  Management and Medication Current Pain Management: Electronic Signature(s) Signed: 04/04/2021  6:06:42 PM By: Baruch Gouty RN, BSN Signed: 04/05/2021 1:41:47 PM By: Sandre Kitty Entered By: Sandre Kitty on 04/04/2021 11:10:12 -------------------------------------------------------------------------------- Patient/Caregiver Education Details Patient Name: Date of Service: Kevin [name], [name] 6/1/2022andnbsp10:30 A M Medical Record Number: [id number] Patient Account Number: [id number] Date of Birth/Gender: Treating RN: [birth year] (70 y.o. [name] Primary Care Physician: [name] Other Clinician: Referring Physician: Treating Physician/Extender: Jeannette Corpus in Treatment: 271 Education Assessment Education Provided To: Patient Education Topics Provided Venous: Methods: Explain/Verbal Responses: Reinforcements needed, State content correctly Electronic Signature(s) Signed: 04/04/2021 6:06:42 PM By: Baruch Gouty RN, BSN Entered By: Baruch Gouty on 04/04/2021 11:31:17 -------------------------------------------------------------------------------- Wound Assessment Details Patient Name: Date of Service: Kevin [name], Talal J. 04/04/2021 10:30 A M Medical Record Number: [id number] Patient Account Number: [id number] Date of Birth/Sex: Treating RN: 1951/10/30 (70 y.o. [name], [name] Primary Care Lynlee Stratton: [name] Other Clinician: Referring Kelina Beauchamp: Treating Manly Nestle/Extender: Jeannette Corpus in Treatment: 271 Wound Status Wound Number: [id number] Primary Diabetic Wound/Ulcer of the Lower Extremity Etiology: Wound Location: Left T Second oe Wound Open Wounding Event: Gradually Appeared Status: Date Acquired: 01/31/2021 Comorbid Chronic sinus problems/congestion, Arrhythmia, Hypertension, Weeks Of Treatment: 9 History: Peripheral Arterial Disease, Type II Diabetes, History of Burn, Gout, Clustered  Wound: No Confinement Anxiety Photos Wound Measurements Length: (cm) 3.3 Width: (cm) 1.8 Depth: (cm) 0.1 Area: (cm) 4.665 Volume: (cm) 0.467 % Reduction in Area: -75.7% % Reduction in Volume: -76.2% Epithelialization: Small (1-33%) Tunneling: No Undermining: No Wound Description Classification: Grade 2 Wound Margin: Distinct, outline attached Exudate Amount: Medium Exudate Type: Serosanguineous Exudate Color: red, brown Foul Odor After Cleansing: No Slough/Fibrino Yes Wound Bed Granulation Amount: Large (67-100%) Exposed Structure Granulation Quality: Red, Pink Fascia Exposed: No Necrotic Amount: Small (1-33%) Fat Layer (Subcutaneous Tissue) Exposed: No Necrotic Quality: Adherent Slough Tendon Exposed: No Muscle Exposed: No Joint Exposed: No Bone Exposed: No Treatment Notes Wound #193 (Toe Second) Wound Laterality: Left Cleanser Peri-Wound Care Topical Primary Dressing KerraCel Ag Gelling Fiber Dressing, 2x2 in (silver alginate) Discharge Instruction: Apply silver alginate to wound bed as instructed Secondary Dressing Woven Gauze Sponges 2x2 in Discharge Instruction: Apply over primary dressing as directed. Secured With Conforming Stretch Gauze Bandage, Sterile 2x75 (in/in) Discharge Instruction: Secure with stretch gauze as directed. Compression Wrap Compression Stockings Add-Ons Notes bilateral lower legs lotion 4 layer compression wraps. Electronic Signature(s) Signed: 04/05/2021 1:41:47 PM By: Sandre Kitty Signed: 04/09/2021 5:37:32 PM By: Rhae Hammock RN Previous Signature: 04/05/2021 12:00:56 PM Version By: Rhae Hammock RN Entered By: Sandre Kitty on 04/05/2021 12:56:54 -------------------------------------------------------------------------------- Wound Assessment Details Patient Name: Date of Service: Kevin [name], Xian J. 04/04/2021 10:30 A M Medical Record Number: [id number] Patient Account Number: [id number] Date of Birth/Sex: Treating  RN: 10/22/1951 (70 y.o. [name], [name] Primary Care Telsa Dillavou: [name] Other Clinician: Referring Arnell Mausolf: Treating Zaccary Creech/Extender: Jeannette Corpus in Treatment: 271 Wound Status Wound Number: [id number] Primary Etiology: Skin Tear Wound Location: Left, Anterior Lower Leg Wound Status: Healed - Epithelialized Wounding Event: Skin Tear/Laceration Date Acquired: 02/14/2021 Weeks Of Treatment: 7 Clustered Wound: No Wound Measurements Length: (cm) Width: (cm) Depth: (cm) Area: (cm) Volume: (cm) 0 % Reduction in Area: 100% 0 % Reduction in Volume: 100% 0 0 0 Wound Description Classification: Full Thickness Without Exposed Support Structu res Treatment Notes Wound #195 (Lower Leg) Wound Laterality: Left, Anterior Cleanser Peri-Wound Care Topical Primary Dressing Secondary Dressing Secured With Compression Wrap Compression Stockings Add-Ons Notes bilateral lower legs lotion 4 layer compression wraps.  Electronic Signature(s) Signed: 04/05/2021 12:00:56 PM By: Rhae Hammock RN Entered By: Rhae Hammock on 04/04/2021 11:20:18 -------------------------------------------------------------------------------- Wound Assessment Details Patient Name: Date of Service: Kevin [name], Laker J. 04/04/2021 10:30 A M Medical Record Number: [id number] Patient Account Number: [id number] Date of Birth/Sex: Treating RN: [birth year] (69 y.o. [name], [name] Primary Care Minka Knight: [name] Other Clinician: Referring Berl Bonfanti: Treating Breven Guidroz/Extender: Jeannette Corpus in Treatment: 271 Wound Status Wound Number: [id number] Primary Diabetic Wound/Ulcer of the Lower Extremity Etiology: Wound Location: Right T Fourth oe Wound Open Wounding Event: Gradually Appeared Status: Date Acquired: 03/21/2021 Comorbid Chronic sinus problems/congestion, Arrhythmia, Hypertension, Weeks Of Treatment: 2 History: Peripheral Arterial  Disease, Type II Diabetes, History of Burn, Gout, Clustered Wound: No Confinement Anxiety Photos Wound Measurements Length: (cm) 0.4 Width: (cm) 0.4 Depth: (cm) 0.1 Area: (cm) 0.126 Volume: (cm) 0.013 % Reduction in Area: 70.3% % Reduction in Volume: 69% Epithelialization: Medium (34-66%) Tunneling: No Undermining: No Wound Description Classification: Grade 2 Wound Margin: Distinct, outline attached Exudate Amount: Medium Exudate Type: Serosanguineous Exudate Color: red, brown Foul Odor After Cleansing: No Slough/Fibrino Yes Wound Bed Granulation Amount: Large (67-100%) Exposed Structure Granulation Quality: Red, Pink Fascia Exposed: No Necrotic Amount: Small (1-33%) Fat Layer (Subcutaneous Tissue) Exposed: No Necrotic Quality: Adherent Slough Tendon Exposed: No Muscle Exposed: No Joint Exposed: No Bone Exposed: No Treatment Notes Wound #196 (Toe Fourth) Wound Laterality: Right Cleanser Peri-Wound Care Topical Primary Dressing KerraCel Ag Gelling Fiber Dressing, 2x2 in (silver alginate) Discharge Instruction: Apply silver alginate to wound bed as instructed Secondary Dressing Woven Gauze Sponges 2x2 in Discharge Instruction: Apply over primary dressing as directed. Secured With Conforming Stretch Gauze Bandage, Sterile 2x75 (in/in) Discharge Instruction: Secure with stretch gauze as directed. Compression Wrap Compression Stockings Add-Ons Notes bilateral lower legs lotion 4 layer compression wraps. Electronic Signature(s) Signed: 04/05/2021 1:41:47 PM By: Sandre Kitty Signed: 04/09/2021 5:37:32 PM By: Rhae Hammock RN Previous Signature: 04/05/2021 12:00:56 PM Version By: Rhae Hammock RN Entered By: Sandre Kitty on 04/05/2021 12:57:43 -------------------------------------------------------------------------------- Vitals Details Patient Name: Date of Service: Kevin [name], Trevious J. 04/04/2021 10:30 A M Medical Record Number: [id number] Patient  Account Number: [id number] Date of Birth/Sex: Treating RN: 02-01-1951 (70 y.o. [name] Primary Care Ronald Vinsant: [name] Other Clinician: Referring Cortlin Marano: Treating Abiha Lukehart/Extender: Jeannette Corpus in Treatment: 271 Vital Signs Time Taken: 11:09 Temperature (F): 98.0 Height (in): 70 Pulse (bpm): 71 Weight (lbs): 380.2 Respiratory Rate (breaths/min): 16 Body Mass Index (BMI): 54.5 Blood Pressure (mmHg): 163/68 Capillary Blood Glucose (mg/dl): 151 Reference Range: 80 - 120 mg / dl Electronic Signature(s) Signed: 04/05/2021 1:41:47 PM By: Sandre Kitty Entered By: Sandre Kitty on 04/04/2021 11:10:01

## 2021-04-11 ENCOUNTER — Encounter (HOSPITAL_BASED_OUTPATIENT_CLINIC_OR_DEPARTMENT_OTHER): Payer: Medicare Other | Admitting: Internal Medicine

## 2021-04-11 ENCOUNTER — Other Ambulatory Visit: Payer: Self-pay

## 2021-04-11 DIAGNOSIS — L97519 Non-pressure chronic ulcer of other part of right foot with unspecified severity: Secondary | ICD-10-CM | POA: Diagnosis not present

## 2021-04-11 DIAGNOSIS — E11621 Type 2 diabetes mellitus with foot ulcer: Secondary | ICD-10-CM | POA: Diagnosis not present

## 2021-04-11 DIAGNOSIS — I482 Chronic atrial fibrillation, unspecified: Secondary | ICD-10-CM | POA: Diagnosis not present

## 2021-04-11 DIAGNOSIS — E11622 Type 2 diabetes mellitus with other skin ulcer: Secondary | ICD-10-CM | POA: Diagnosis not present

## 2021-04-11 DIAGNOSIS — E1151 Type 2 diabetes mellitus with diabetic peripheral angiopathy without gangrene: Secondary | ICD-10-CM | POA: Diagnosis not present

## 2021-04-11 DIAGNOSIS — Z794 Long term (current) use of insulin: Secondary | ICD-10-CM | POA: Diagnosis not present

## 2021-04-11 DIAGNOSIS — Z9119 Patient's noncompliance with other medical treatment and regimen: Secondary | ICD-10-CM | POA: Diagnosis not present

## 2021-04-11 DIAGNOSIS — L97829 Non-pressure chronic ulcer of other part of left lower leg with unspecified severity: Secondary | ICD-10-CM | POA: Diagnosis not present

## 2021-04-11 DIAGNOSIS — I872 Venous insufficiency (chronic) (peripheral): Secondary | ICD-10-CM | POA: Diagnosis not present

## 2021-04-11 DIAGNOSIS — I87333 Chronic venous hypertension (idiopathic) with ulcer and inflammation of bilateral lower extremity: Secondary | ICD-10-CM | POA: Diagnosis not present

## 2021-04-11 DIAGNOSIS — I272 Pulmonary hypertension, unspecified: Secondary | ICD-10-CM | POA: Diagnosis not present

## 2021-04-11 DIAGNOSIS — L97521 Non-pressure chronic ulcer of other 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 of skin: Secondary | ICD-10-CM | POA: Diagnosis not present

## 2021-04-11 DIAGNOSIS — I890 Lymphedema, not elsewhere classified: Secondary | ICD-10-CM | POA: Diagnosis not present

## 2021-04-11 DIAGNOSIS — E1140 Type 2 diabetes mellitus with diabetic neuropathy, unspecified: Secondary | ICD-10-CM | POA: Diagnosis not present
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
## 2021-04-11 NOTE — Progress Notes (Signed)
KASTIN, CERDA ([id number]) Visit Report for 04/11/2021 SuperBill Details Patient Name: Date of Service: CO OUSMAN, DISE 04/11/2021 Medical Record Number: [id number] Patient Account Number: [id number] Date of Birth/Sex: Treating RN: April 16, 1951 (70 y.o. 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 Electronic Signature(s) Signed: 04/11/2021 3:25:37 PM By: Geralyn Corwin DO Signed: 04/11/2021 5:57:05 PM By: [name] RN, BSN Entered By: [name] on 04/11/2021 11:25:30

## 2021-04-16 ENCOUNTER — Ambulatory Visit (INDEPENDENT_AMBULATORY_CARE_PROVIDER_SITE_OTHER): Payer: Medicare Other

## 2021-04-16 ENCOUNTER — Other Ambulatory Visit: Payer: Self-pay

## 2021-04-16 ENCOUNTER — Telehealth: Payer: Self-pay

## 2021-04-16 DIAGNOSIS — R7989 Other specified abnormal findings of blood chemistry: Secondary | ICD-10-CM

## 2021-04-16 LAB — BASIC METABOLIC PANEL
BUN: 24 mg/dL — ABNORMAL HIGH (ref 6–23)
CO2: 28 mEq/L (ref 19–32)
Calcium: 8.9 mg/dL (ref 8.4–10.5)
Chloride: 100 mEq/L (ref 96–112)
Creatinine, Ser: 1.49 mg/dL (ref 0.40–1.50)
GFR: 47.41 mL/min — ABNORMAL LOW (ref >60.00)
Glucose, Bld: 133 mg/dL — ABNORMAL HIGH (ref 70–99)
Potassium: 4.4 mEq/L (ref 3.5–5.1)
Sodium: 139 mEq/L (ref 135–145)

## 2021-04-16 NOTE — Telephone Encounter (Signed)
[facility] Day - Client TELEPHONE ADVICE RECORD [facility] Patient Name: [name] ER Gender: Male DOB: 03-05-1951 Age: 70 Y 9 D Return Phone Number: [phone] (Primary) Address: City/ State/ Zip: [place]  [address] Client [facility] Day - Client Client Site [facility] - Day Physician Santiago Bumpers - MD Contact Type Call Who Is Calling Patient / Member / Family / Caregiver Call Type Triage / Clinical Relationship To Patient Self Return Phone Number [phone] (Primary) Chief Complaint BLOOD PRESSURE HIGH - Systolic (top number) 200 or greater Reason for Call Symptomatic / Request for Health Information Initial Comment Caller states his face was flushed and checked his blood pressure. It is 203/98. What medication should he take? Translation No Nurse Assessment Nurse: Eulah Pont, RN, Lannette Donath Date/Time (Eastern Time): 04/13/2021 9:23:38 PM Confirm and document reason for call. If symptomatic, describe symptoms. ---Caller states his face was flushed and checked his blood pressure. It is 203/98. What medication should he take? States he takes lisinopril and metoprolol for BP. States saw Dr. Milinda Cave last week and offered to increase the strength of the medication for stress going on. Does the patient have any new or worsening symptoms? ---Yes Will a triage be completed? ---Yes Related visit to physician within the last 2 weeks? ---Yes Does the PT have any chronic conditions? (i.e. diabetes, asthma, this includes High risk factors for pregnancy, etc.) ---Yes List chronic conditions. ---HTN, diabetes, afib, chronic lymphedema Is this a behavioral health or substance abuse call? ---No Guidelines Guideline Title Affirmed Question Affirmed Notes Nurse Date/Time (Eastern Time) Blood Pressure - High [1] Systolic BP >= 200 OR Diastolic >= 120 AND [2] having NO cardiac or neurologic symptoms Franki Monte 04/13/2021 9:25:41 PM PLEASE NOTE: All timestamps contained within this report are represented as [place] Standard Time. CONFIDENTIALTY NOTICE: This fax transmission is intended only for the addressee. It contains information that is legally privileged, confidential or otherwise protected from use or disclosure. If you are not the intended recipient, you are strictly prohibited from reviewing, disclosing, copying using or disseminating any of this information or taking any action in reliance on or regarding this information. If you have received this fax in error, please notify us immediately by telephone so that we can arrange for its return to [place]. Phone: [phone], Toll-Free: [phone], Fax: [phone] Page: 2 of 2 Call Id: [id number] Disp. Time Lamount Cohen Time) Disposition Final User 04/13/2021 9:22:25 PM Send to Urgent Antony Madura 04/13/2021 9:28:20 PM See HCP within 4 Hours (or PCP triage) Yes Eulah Pont, RN, Roe Rutherford Disagree/Comply Disagree Caller Understands Yes PreDisposition Call Doctor Care Advice Given Per Guideline SEE HCP (OR PCP TRIAGE) WITHIN 4 HOURS: * IF OFFICE WILL BE OPEN: You need to be seen within the next 3 or 4 hours. Call your doctor (or NP/PA) now or as soon as the office opens. * IF OFFICE WILL BE CLOSED AND NO PCP (PRIMARY CARE PROVIDER) SECOND-LEVEL TRIAGE: You need to be seen within the next 3 or 4 hours. A nearby Urgent Care Center [facility]) is often a good source of care. Another choice is to go to the ED. Go sooner if you become worse. CALL BACK IF: * Weakness or numbness of the face, arm or leg on one side of the body occurs * Difficulty walking, difficulty talking, or severe headache occurs * Chest pain or difficulty breathing occurs * You become worse CARE ADVICE given per High Blood Pressure (Adult) guideline.  Comments User: Lesly Rubenstein, RN Date/Time (Eastern Time): 04/13/2021 9:29:07 PM pt asking if he can take an extra one of his BP  medications. Informed pt unable to recommend that and unable to contact on call after hours. Pt states he took a 1/2 pill of one of his BP medications earlier so is going to see if that helps and may go to ED tomorrow. Referrals GO TO FACILITY REFUSED

## 2021-04-16 NOTE — Telephone Encounter (Signed)
Spoke with patient regarding recommendations, f/u appt scheduled for 6/17.

## 2021-04-16 NOTE — Telephone Encounter (Signed)
Increase lisinopril to WHOLE 40mg  tab daily. Continue 2 metoprolol tabs twice a day. Make f/u appt this week.

## 2021-04-16 NOTE — Telephone Encounter (Signed)
He had issues Friday and had concerns that it may be BP issue, he took another 1/2 metoprolol and his BP lowered down. He is now taking full tab of Metoprolol,  reading this morning was 184/79.   Please advise, thanks.

## 2021-04-18 ENCOUNTER — Encounter (HOSPITAL_BASED_OUTPATIENT_CLINIC_OR_DEPARTMENT_OTHER): Payer: Medicare Other | Admitting: Physician Assistant

## 2021-04-18 ENCOUNTER — Ambulatory Visit (INDEPENDENT_AMBULATORY_CARE_PROVIDER_SITE_OTHER): Payer: Medicare Other | Admitting: Physician Assistant

## 2021-04-18 ENCOUNTER — Other Ambulatory Visit: Payer: Self-pay

## 2021-04-18 VITALS — BP 124/50 | HR 42 | Ht 69.0 in | Wt 375.0 lb

## 2021-04-18 DIAGNOSIS — Z9119 Patient's noncompliance with other medical treatment and regimen: Secondary | ICD-10-CM | POA: Diagnosis not present

## 2021-04-18 DIAGNOSIS — E1140 Type 2 diabetes mellitus with diabetic neuropathy, unspecified: Secondary | ICD-10-CM | POA: Diagnosis not present

## 2021-04-18 DIAGNOSIS — I890 Lymphedema, not elsewhere classified: Secondary | ICD-10-CM | POA: Diagnosis not present

## 2021-04-18 DIAGNOSIS — L97829 Non-pressure chronic ulcer of other part of left lower leg with unspecified severity: Secondary | ICD-10-CM | POA: Diagnosis not present

## 2021-04-18 DIAGNOSIS — L97521 Non-pressure chronic ulcer of other 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 of skin: Secondary | ICD-10-CM | POA: Diagnosis not present

## 2021-04-18 DIAGNOSIS — E119 Type 2 diabetes mellitus without complications: Secondary | ICD-10-CM

## 2021-04-18 DIAGNOSIS — I10 Essential (primary) hypertension: Secondary | ICD-10-CM | POA: Diagnosis not present

## 2021-04-18 DIAGNOSIS — I4821 Permanent atrial fibrillation: Secondary | ICD-10-CM | POA: Diagnosis not present

## 2021-04-18 DIAGNOSIS — L97519 Non-pressure chronic ulcer of other part of right foot with unspecified severity: Secondary | ICD-10-CM | POA: Diagnosis not present

## 2021-04-18 DIAGNOSIS — I482 Chronic atrial fibrillation, unspecified: Secondary | ICD-10-CM | POA: Diagnosis not present

## 2021-04-18 DIAGNOSIS — I5042 Chronic combined systolic (congestive) and diastolic (congestive) heart failure: Secondary | ICD-10-CM | POA: Diagnosis not present

## 2021-04-18 DIAGNOSIS — I872 Venous insufficiency (chronic) (peripheral): Secondary | ICD-10-CM | POA: Diagnosis not present

## 2021-04-18 DIAGNOSIS — I272 Pulmonary hypertension, unspecified: Secondary | ICD-10-CM | POA: Diagnosis not present

## 2021-04-18 DIAGNOSIS — E1151 Type 2 diabetes mellitus with diabetic peripheral angiopathy without gangrene: Secondary | ICD-10-CM | POA: Diagnosis not present

## 2021-04-18 DIAGNOSIS — E11621 Type 2 diabetes mellitus with foot ulcer: Secondary | ICD-10-CM | POA: Diagnosis not present

## 2021-04-18 DIAGNOSIS — Z794 Long term (current) use of insulin: Secondary | ICD-10-CM | POA: Diagnosis not present

## 2021-04-18 DIAGNOSIS — I87333 Chronic venous hypertension (idiopathic) with ulcer and inflammation of bilateral lower extremity: Secondary | ICD-10-CM | POA: Diagnosis not present

## 2021-04-18 DIAGNOSIS — E11622 Type 2 diabetes mellitus with other skin ulcer: Secondary | ICD-10-CM | POA: Diagnosis not present

## 2021-04-18 MED ORDER — METOPROLOL TARTRATE 50 MG PO TABS: 25.0000 mg | ORAL_TABLET | Freq: Two times a day (BID) | ORAL | 3 refills | Status: DC

## 2021-04-18 MED ORDER — LISINOPRIL 40 MG PO TABS: 40.0000 mg | ORAL_TABLET | Freq: Every day | ORAL | 1 refills | Status: DC

## 2021-04-18 MED ORDER — LISINOPRIL 40 MG PO TABS: 40.0000 mg | ORAL_TABLET | Freq: Every day | ORAL | 3 refills | Status: DC

## 2021-04-18 NOTE — Patient Instructions (Addendum)
Medication Instructions:  DECREASE Metoprolol Tartrate (Lopressor) to 25 mg (half tablet) 2 times a day *If you need a refill on your cardiac medications before your next appointment, please call your pharmacy*  Lab Work: NONE ordered at this time of appointment   If you have labs (blood work) drawn today and your tests are completely normal, you will receive your results only by: MyChart Message (if you have MyChart) OR A paper copy in the mail If you have any lab test that is abnormal or we need to change your treatment, we will call you to review the results.  Testing/Procedures: NONE ordered at this time of appointment   Follow-Up: At [facility], you and your health needs are our priority.  As part of our continuing mission to provide you with exceptional heart care, we have created designated Provider Care Teams.  These Care Teams include your primary Cardiologist (physician) and Advanced Practice Providers (APPs -  Physician Assistants and Nurse Practitioners) who all work together to provide you with the care you need, when you need it.   Your next appointment:   2 month(s)  The format for your next appointment:   In Person  Provider:   K. Italy Hilty, MD or APP  Other Instructions

## 2021-04-18 NOTE — Progress Notes (Signed)
Cardiology Office Note:    Date:  04/20/2021   ID:  [name], DOB 1951/06/24, MRN [id number]  PCP:  [name], MD   [facility] Providers Cardiologist:  [name], MD Cardiology APP:  Azalee Course, [place]     Referring MD: [name], MD   Chief Complaint  Patient presents with   Follow-up   Shortness of Breath    History of Present Illness:    [name] is a 70 y.o. male with a hx of chronic atrial fibrillation, chronic combined systolic and diastolic heart failure, CKD stage III, DM 2, HLD, HTN, morbid obesity, OSA, and RLS.  Previous Myoview obtained on 10/05/2014 showed EF 42%, low risk perfusion study with diaphragmatic attenuation, elevated TID.  Echocardiogram obtained around the same time showed EF 50 to 55%, grade 2 DD, no regional wall motion abnormality.  Last echocardiogram obtained on 12/29/2017 showed EF 60 to 65%, mild RV dysfunction, PA peak pressure 53 mmHg.  I last saw the patient in April 2021, he was taking Lasix 80 mg a.m. and 40 mg p.m. at the time.  He was most recently seen by his PCP on 04/04/2021, he seems to be doing well at the time.   Patient presents today for follow-up.  He is undergoing tremendous amount of emotional stress recently as his apartment complex was recently sold and he was given 60 days to evacuate.  Blood pressure went up to 180s.  Although previous medication list says he was supposed to be on 1-1/2 tablet of the 50 mg metoprolol tartrate twice a day, he was only taking 1/2 tablet twice a day dosing of 50 mg tablet, therefore he was taking 25 mg twice daily.  Because of the blood pressure elevation, he was told to increase metoprolol to a full tablet twice a day.  Lisinopril was previously at 20 mg daily, this was increased to 40 mg daily.  He presents today for follow-up, EKG showed atrial fibrillation with heart rate 42 bpm.  Even on physical exam, however this seems to be slow.  Fortunately the patient is  asymptomatic without any dizziness, blurred vision or feeling of passing out.  I do recommend reduce the metoprolol back down to 25 mg twice a day as this time to avoid significant bradycardia.  He is scheduled to see Dr. Lynford Humphrey this Friday.  He can follow-up with cardiology service with repeat EKG in 2 months.  Past Medical History:  Diagnosis Date   ALLERGIC RHINITIS 08/11/2006   ASTHMA 08/11/2006   Chronic atrial fibrillation (HCC) 08/2014   Chronic combined systolic and diastolic CHF (congestive heart failure) [facility])    Cardiology f/u 12/2017: pt volume overloaded (R heart dysf suspected), BNP very high, lasix increased.  Repeat echo 12/2017: normal LV EF, mild DD, +RV syst dysfxn, mod pulm HTN, biatrial enlgmt.   Chronic constipation    Chronic renal insufficiency, stage 2 (mild)    Borderline stage III (GFR 60s).   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  Plan repeat CBC at f/u summer 2019 and if decreased from baseline will check hemoccults and iron labs again.   OBESITY,  MORBID 12/14/2007   saxenda started 05/2020 by [facility] wt mgmt center   OSA (obstructive sleep apnea)    to get sleep study with [facility] as of 12/03/2018 consult.   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  [id number] ID started him on amoxil prophylaxis---ineffective.  End 2018/Jan 2019 penicillin G injections prophyl helpful but pt declined to continue this as of 01/2018 ID f/u.  ID talked him into resuming monthly penicillin G as of 02/2018 f/u.     Restless leg syndrome    Rx'd clonazepam 09/2017 and pt refused to take it after  reading the medication's potential side effects.   Venous stasis ulcers of both lower extremities (HCC)    Severe lymphedema.  wound clinic care ongoing as of 01/2018    Past Surgical History:  Procedure Laterality Date   Carotid dopplers  07/23/2018   Left NORMAL.  Right 1-39% ICA stenosis, with <50% distal CCA stenosis (not hemodynamically significant)   TRANSTHORACIC ECHOCARDIOGRAM  11/2007; 09/2014; 11/2015;12/2017   LV fxn normal, EF normal, mild dilation of left atrium.  2015 grade II diast dysfxn.  2017 EF 55-60%. 2019: LVEF 60-65%, mild RV syst dysf,biatrial enlgmt, mod pulm htn.   URETERAL STENT PLACEMENT     virtual colonoscopy  01/2011   Normal    Current Medications: Current Meds  Medication Sig   arginine 500 MG tablet Take by mouth.   b complex vitamins capsule Take 1 capsule by mouth every morning.   butalbital-acetaminophen-caffeine (FIORICET WITH CODEINE) 50-325-40-30 MG capsule TAKE 1 TO 2 CAPSULES BY  MOUTH EVERY 6 HOURS AS  NEEDED FOR HEADACHE(S)  MANUFACTURER RECOMMENDS NOT EXCEEDING 6 CAPSULES/DAY   cetirizine (ZYRTEC) 10 MG tablet Take 10 mg by mouth daily.   CHROMIUM GTF PO Take by mouth daily.   Coenzyme Q10 (CO Q 10) 10 MG CAPS Take by mouth. Reported on 02/19/2016   diltiazem (TIAZAC) 240 MG 24 hr capsule TAKE 1 CAPSULE BY MOUTH  DAILY   furosemide (LASIX) 40 MG tablet TAKE 2 TABLETS BY MOUTH TWO TIMES DAILY (Patient taking differently: Take 80 mg in the mornings and 40 mg in the evenings)   Glutamine 500 MG CAPS Take by mouth.   HYDROcodone-acetaminophen (NORCO/VICODIN) 5-325 MG tablet 1-2 tabs po bid prn pain   Insulin Lispro Prot & Lispro (HUMALOG MIX 75/25 KWIKPEN) (75-25) 100 UNIT/ML Kwikpen INJECT SUBCUTANEOUSLY 25  UNITS EVERY MORNING AND 20  UNITS EVERY EVENING AT  SUPPER   Insulin Pen Needle (B-D ULTRAFINE III SHORT PEN) 31G X 8 MM MISC USE 1 PENNEEDLE TWICE DAILY   metFORMIN (GLUCOPHAGE) 1000 MG tablet TAKE 1 TABLET BY MOUTH  TWICE DAILY WITH MEALS    Multiple Vitamin (MULTIVITAMIN) tablet Take 1 tablet by mouth daily.   ONETOUCH ULTRA test strip CHECK BLOOD SUGAR TWICE  DAILY   rivaroxaban (XARELTO) 20 MG TABS tablet TAKE 1 TABLET BY MOUTH ONCE DAILY WITH SUPPER   terazosin (HYTRIN) 10 MG capsule TAKE 1 CAPSULE BY MOUTH  ONCE DAILY AT BEDTIME   vitamin E 400 UNIT capsule Take 400 Units by mouth every morning. Selenium 50mg    [DISCONTINUED] lisinopril (ZESTRIL) 40 MG tablet Take 1/2 tablet(20mg ) by mouth daily. (Patient taking differently: Take 40 mg by mouth daily.)   [DISCONTINUED] metoprolol tartrate (LOPRESSOR) 50 MG tablet TAKE 1 AND 1/2 TABLETS BY  MOUTH TWICE DAILY   Current Facility-Administered Medications for the 04/18/21 encounter (Office Visit) with 04/20/21,  Wynema BirchHao, [place]  Medication   penicillin g benzathine (BICILLIN LA) 1200000 UNIT/2ML injection 600,000 Units   penicillin g benzathine (BICILLIN LA) 1200000 UNIT/2ML injection 600,000 Units   penicillin g benzathine (BICILLIN LA) 1200000 UNIT/2ML injection 600,000 Units   penicillin g benzathine (BICILLIN LA) 1200000 UNIT/2ML injection 600,000 Units     Allergies:   Other   Social History   Socioeconomic History   Marital status: Single    Spouse name: Not on file   Number of children: Not on file   Years of education: Not on file   Highest education level: Not on file  Occupational History   Occupation: retired  Tobacco Use   Smoking status: Never   Smokeless tobacco: Never  Vaping Use   Vaping Use: Never used  Substance and Sexual Activity   Alcohol use: No   Drug use: No   Sexual activity: Not on file  Other Topics Concern   Not on file  Social History Narrative   Single, no children.   [place](orig from [place]   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   No Tobacco, no alc, drugs.   Exercise: no    Diet: no      Social Determinants of Health   Financial Resource Strain: Medium Risk   Difficulty of Paying Living  Expenses: Somewhat hard  Food Insecurity: Food Insecurity Present   Worried About Programme researcher, broadcasting/film/videounning Out of Food in the Last Year: Sometimes true   Ran Out of Food in the Last Year: Sometimes true  Transportation Needs: No Transportation Needs   Lack of Transportation (Medical): No   Lack of Transportation (Non-Medical): No  Physical Activity: Inactive   Days of Exercise per Week: 0 days   Minutes of Exercise per Session: 0 min  Stress: Stress Concern Present   Feeling of Stress : To some extent  Social Connections: Moderately Isolated   Frequency of Communication with Friends and Family: More than three times a week   Frequency of Social Gatherings with Friends and Family: Once a week   Attends Religious Services: 1 to 4 times per year   Active Member of [employer] or Organizations: No   Attends BankerClub or Organization Meetings: Never   Marital Status: Never married     Family History: The patient's family history includes Diabetes in his mother and sister; Hydrocephalus in his sister.  ROS:   Please see the history of present illness.     All other systems reviewed and are negative.  EKGs/Labs/Other Studies Reviewed:    The following studies were reviewed today:  Echo 12/29/2017 LV EF: 60% -   65%   -------------------------------------------------------------------  Indications:      (R06.02).   -------------------------------------------------------------------  History:   PMH:  Lower extremity edema. Acquired from the patient  and from the patient&'s chart.  Dyspnea.  Atrial fibrillation.  Risk  factors:  Hypertension. Diabetes mellitus. Morbidly obese.   -------------------------------------------------------------------  Study Conclusions   - Left ventricle: The cavity size was normal. Wall thickness was    normal. Systolic function was normal. The estimated ejection    fraction was in the range of 60% to 65%. Wall motion was normal;    there were no regional wall motion  abnormalities.  - Ventricular septum: The contour showed diastolic flattening.  - Aortic valve: Moderately calcified annulus.  - Mitral valve: Moderately calcified annulus.  - Left atrium: The atrium was mildly to moderately dilated.  - Right ventricle: The cavity size  was mildly dilated. Systolic    function was mildly reduced.  - Right atrium: The atrium was moderately to severely dilated.  - Pulmonary arteries: Systolic pressure was moderately increased.    PA peak pressure: 53 mm Hg (S).   EKG:  EKG is ordered today.  The ekg ordered today demonstrates atrial fibrillation with a heart rate 42 bpm.  Right bundle branch block, left posterior fascicular block.  Recent Labs: 04/04/2021: ALT 11; Hemoglobin 13.4; Platelets 236 04/20/2021: BUN 26; Creatinine, Ser 1.39; Potassium 4.5; Sodium 140  Recent Lipid Panel    Component Value Date/Time   CHOL 101 04/04/2021 0844   TRIG 61 04/04/2021 0844   HDL 41 04/04/2021 0844   CHOLHDL 2.5 04/04/2021 0844   VLDL 12.2 07/12/2020 1015   LDLCALC 46 04/04/2021 0844     Risk Assessment/Calculations:    CHA2DS2-VASc Score = 4  This indicates a 4.8% annual risk of stroke. The patient's score is based upon: CHF History: Yes HTN History: Yes Diabetes History: Yes Stroke History: No Vascular Disease History: No Age Score: 1 Gender Score: 0     Physical Exam:    VS:  BP (!) 124/50 (BP Location: Left Arm, Patient Position: Sitting, Cuff Size: Large)   Pulse (!) 42   Ht 5\' 9"  (1.753 m)   Wt (!) 375 lb (170.1 kg)   BMI 55.38 kg/m     Wt Readings from Last 3 Encounters:  04/20/21 (!) 372 lb 12.8 oz (169.1 kg)  04/18/21 (!) 375 lb (170.1 kg)  04/04/21 (!) 383 lb 3.2 oz (173.8 kg)     GEN:  Well nourished, well developed in no acute distress HEENT: Normal NECK: No JVD; No carotid bruits LYMPHATICS: No lymphadenopathy CARDIAC: Irregularly irregular, no murmurs, rubs, gallops RESPIRATORY:  Clear to auscultation without rales, wheezing  or rhonchi  ABDOMEN: Soft, non-tender, non-distended MUSCULOSKELETAL:  No edema; No deformity  SKIN: Warm and dry NEUROLOGIC:  Alert and oriented x 3 PSYCHIATRIC:  Normal affect   ASSESSMENT:    1. Permanent atrial fibrillation (HCC)   2. Chronic combined systolic and diastolic heart failure (HCC)   3. Essential hypertension   4. Controlled type 2 diabetes mellitus without complication, without long-term current use of insulin (HCC)   5. Morbid obesity due to excess calories (HCC)    PLAN:    In order of problems listed above:  Permanent atrial fibrillation: He was previously taking 25 mg twice a day of metoprolol, more recently, metoprolol was increased to 50 mg twice a day due to elevated blood pressure.  His heart rate today is 42 bpm.  Fortunately he does not have any signs of dizziness, blurred vision or feeling of passing out.  I recommend to reduce metoprolol back to 25 mg twice a day again.  He says his blood pressure has been elevated recently due to his emotional stress.  His apartment was recently sold and that he was given 60-day to evacuate.  Continue Xarelto  Chronic combined systolic and diastolic heart failure: Euvolemic on exam  Hypertension: Blood pressure is normal today.  He was previously taking 25 mg twice a day of metoprolol, recently metoprolol was increased to 50 mg twice a day, due to significant bradycardia, I decided to reduce metoprolol back to 25 mg twice a day.  DM2: On insulin.  Management per primary care provider.  Morbid obesity: Weight loss imperative.         Medication Adjustments/Labs and Tests Ordered: Current medicines are reviewed  at length with the patient today.  Concerns regarding medicines are outlined above.  Orders Placed This Encounter  Procedures   EKG 12-Lead   Meds ordered this encounter  Medications   DISCONTD: metoprolol tartrate (LOPRESSOR) 50 MG tablet    Sig: Take 0.5 tablets (25 mg total) by mouth 2 (two) times  daily.    Dispense:  90 tablet    Refill:  3    Dose change new Rx   DISCONTD: lisinopril (ZESTRIL) 40 MG tablet    Sig: Take 1 tablet (40 mg total) by mouth daily. Take 1/2 tablet(20mg ) by mouth daily.    Dispense:  90 tablet    Refill:  1   lisinopril (ZESTRIL) 40 MG tablet    Sig: Take 1 tablet (40 mg total) by mouth daily.    Dispense:  90 tablet    Refill:  3   metoprolol tartrate (LOPRESSOR) 50 MG tablet    Sig: Take 0.5 tablets (25 mg total) by mouth 2 (two) times daily.    Dispense:  90 tablet    Refill:  3    Dose change new Rx    Patient Instructions  Medication Instructions:  DECREASE Metoprolol Tartrate (Lopressor) to 25 mg (half tablet) 2 times a day *If you need a refill on your cardiac medications before your next appointment, please call your pharmacy*  Lab Work: NONE ordered at this time of appointment   If you have labs (blood work) drawn today and your tests are completely normal, you will receive your results only by: MyChart Message (if you have MyChart) OR A paper copy in the mail If you have any lab test that is abnormal or we need to change your treatment, we will call you to review the results.  Testing/Procedures: NONE ordered at this time of appointment   Follow-Up: At [facility], you and your health needs are our priority.  As part of our continuing mission to provide you with exceptional heart care, we have created designated Provider Care Teams.  These Care Teams include your primary Cardiologist (physician) and Advanced Practice Providers (APPs -  Physician Assistants and Nurse Practitioners) who all work together to provide you with the care you need, when you need it.   Your next appointment:   2 month(s)  The format for your next appointment:   In Person  Provider:   Kirtland Bouchard [place] Hilty, MD or APP  Other Instructions    Signed, Azalee Course, [place]  04/20/2021 11:17 PM    [facility]

## 2021-04-18 NOTE — Progress Notes (Addendum)
JIE, STICKELS ([id number]) Visit Report for 04/18/2021 Chief Complaint Document Details Patient Name: Date of Service: CO BETHEL, GAGLIO 04/18/2021 10:30 A M Medical Record Number: [id number] Patient Account Number: [id number] Date of Birth/Sex: Treating RN: 01/17/1951 (70 y.o. [name] Primary Care Provider: Nicoletta Ba Other Clinician: Referring Provider: Treating Provider/Extender: Adele Dan in Treatment: 273 Information Obtained from: Patient Chief Complaint patient is here for evaluation of venous/lymphedema ulcers Electronic Signature(s) Signed: 04/18/2021 10:47:57 AM By: Lenda Kelp PA-C Entered By: Lenda Kelp on 04/18/2021 10:47:57 -------------------------------------------------------------------------------- HPI Details Patient Name: Date of Service: [name], Laterrance J. 04/18/2021 10:30 A M Medical Record Number: [id number] Patient Account Number: [id number] Date of Birth/Sex: Treating RN: [birth year] (70 y.o. [name] Primary Care Provider: Nicoletta Ba Other Clinician: Referring Provider: Treating Provider/Extender: Adele Dan in Treatment: 273 History of Present Illness HPI Description: Referred by PCP for consultation. Patient has long standing history of BLE venous stasis, no prior ulcerations. At beginning of month, developed cellulitis and weeping. Received IM Rocephin followed by Keflex and resolved. Wears compression stocking, appr 6 months old. Not sure strength. No present drainage. 01/22/16 this is a patient who is a type II diabetic on insulin. 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 He tells me he religiously wears pressure stockings of uncertain strength. He was here with weeping edema about 8 months ago but did not have an open wound. Roughly a month ago he had a reopening on his bilateral legs. He is been using bandages and Neosporin. He  does not complain of pain. 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 Last BUN/creatinine I have is from 11/20/15 at 13 and 1.0 respectively 01/29/16; patient arrives today having tolerated the Profore wrap. He brought in his stockings and these are 18 mmHg stockings he bought from [place]. The compression here is likely inadequate. He does not complain of pain or excessive drainage she has no systemic symptoms. The wound on the right looks improved as does the one on the left although one on the left is more substantial with still tissue at risk below the actual wound area on the bilateral posterior calf 02/05/16; patient arrives with poor edema control. He states that we did put a 4 layer compression on it last week. No weight appear 5 this. 02/12/16; the area on the posterior right Has healed. The left Has a substantial wound that has necrotic surface eschar that requires a debridement with a curette. 02/16/16;the patient called or a Nurse visit secondary to increased swelling. He had been in earlier in the week with his right leg healed. He was transitioned to is on pressure stocking on the right leg with the only open wound on the left, a substantial area on the left posterior calf. Note he has a history of severe lower extremity edema, he has a history of chronic atrial fibrillation but not heart failure per my notes but I'll need to research this. He is not complaining of chest pain shortness of breath or orthopnea. The intake nurse noted blisters on the previously closed right leg 02/19/16; this is the patient's regular visit day. I see him on Friday with escalating edema new wounds on the right leg and clear signs of at least right ventricular heart failure. I increased his Lasix to 40 twice a  day. He is returning currently in follow-up. States he is noticed a decrease in that the  edema 02/26/16 patient's legs have much less edema. There is nothing really open on the right leg. The left leg has improved condition of the large superficial wound on the posterior left leg 03/04/16; edema control is very much better. The patient's right leg wounds have healed. On the left leg he continues to have severe venous inflammation on the posterior aspect of the left leg. There is no tenderness and I don't think any of this is cellulitis. 03/11/16; patient's right leg is married healed and he is in his own stocking. The patient's left leg has deteriorated somewhat. There is a lot of erythema around the wound on the posterior left leg. There is also a significant rim of erythema posteriorly just above where the wrap would've ended there is a new wound in this location and a lot of tenderness. Can't rule out cellulitis in this area. 03/15/16; patient's right leg remains healed and he is in his own stocking. The patient's left leg is much better than last review. His major wound on the posterior aspect of his left Is almost fully epithelialized. He has 3 small injuries from the wraps. Really. Erythema seems a lot better on antibiotics 03/18/16; right leg remains healed and he is in his own stocking. The patient's left leg is much better. The area on the posterior aspect of the left calf is fully epithelialized. His 3 small injuries which were wrap injuries on the left are improved only one seems still open his erythema has resolved 03/25/16; patient's right leg remains healed and he is in his own stocking. There is no open area today on the left leg posterior leg is completely closed up. His wrap injuries at the superior aspect of his leg are also resolved. He looks as though he has some irritation on the dorsal ankle but this is fully epithelialized without evidence of infection. 03/28/16; we discharged this patient on Monday. Transitioned him into his own stocking. There were problems almost  immediately with uncontrolled swelling weeping edema multiple some of which have opened. He does not feel systemically unwell in particular no chest pain no shortness of breath and he does not feel 04/08/16; the edema is under better control with the Profore light wrap but he still has pitting edema. There is one large wound anteriorly 2 on the medial aspect of his left leg and 3 small areas on the superior posterior calf. Drainage is not excessive he is tolerating a Profore light well 04/15/16; put a Profore wrap on him last week. This is controlled is edema however he had a lot of pain on his left anterior foot most of his wounds are healed 04/22/16 once again the patient has denuded areas on the left anterior foot which he states are because his wrap slips up word. He saw his primary physician today is on Lasix 40 twice a day and states that he his weight is down 20 pounds over the last 3 months. 04/29/16: Much improved. left anterior foot much improved. He is now on Lasix 80 mg per day. Much improved edema control 05/06/16; I was hoping to be able to discharge him today however once again he has blisters at a low level of where the compression was placed last week mostly on his left lateral but also his left medial leg and a small area on the anterior part of the left foot. 05/09/16; apparently the patient  went home after his appointment on 7/4 later in the evening developing pain in his upper medial thigh together with subjective fever and chills although his temperature was not taken. The pain was so intense he felt he would probably have to call 911. However he then remembered that he had leftover doxycycline from a previous round of antibiotics and took these. By the next morning he felt a lot better. He called and spoke to one of our nurses and I approved doxycycline over the phone thinking that this was in relation to the wounds we had previously seen although they were definitely were not. The  patient feels a lot better old fever no chills he is still working. Blood sugars are reasonably controlled 05/13/16; patient is back in for review of his cellulitis on his anterior medial upper thigh. He is taking doxycycline this is a lot better. Culture I did of the nodular area on the dorsal aspect of his foot grew MRSA this also looks a lot better. 05/20/16; the patient is cellulitis on the medial upper thigh has resolved. All of his wound areas including the left anterior foot, areas on the medial aspect of the left calf and the lateral aspect of the calf at all resolved. He has a new blister on the left dorsal foot at the level of the fourth toe this was excised. No evidence of infection 05/27/16; patient continues to complain weeping edema. He has new blisterlike wounds on the left anterior lateral and posterior lateral calf at the top of his wrap levels. The area on his left anterior foot appears better. He is not complaining of fever, pain or pruritus in his feet. 05/30/16; the patient's blisters on his left anterior leg posterior calf all look improved. He did not increase the Lasix 100 mg as I suggested because he was going to run out of his 40 mg tablets. He is still having weeping edema of his toes 06/03/16; I renewed his Lasix at 80 mg once a day as he was about to run out when I last saw him. He is on 80 mg of Lasix now. I have asked him to cut down on the excessive amount of water he was drinking and asked him to drink according to his thirst mechanisms 06/12/2016 -- was seen 2 days ago and was supposed to wear his compression stockings at home but he is developed lymphedema and superficial blisters on the left lower extremity and hence came in for a review 06/24/16; the remaining wound is on his left anterior leg. He still has edema coming from between his toes. There is lymphedema here however his edema is generally better than when I last saw this. He has a history of atrial fibrillation  but does not have a known history of congestive heart failure nevertheless I think he probably has this at least on a diastolic basis. 07/01/16 I reviewed his echocardiogram from January 2017. This was essentially normal. He did not have LVH, EF of 55-60%. His right ventricular function was normal although he did have trivial tricuspid and pulmonic regurgitation. This is not audible on exam however. I increased his Lasix to do massive edema in his legs well above his knees I think in early July. He was also drinking an excessive amount of water at the time. 07/15/16; missed his appointment last week because of the Labor Day holiday on Monday. He could not get another appointment later in the week. Started to feel the wrap digging in superiorly  so we remove the top half and the bottom half of his wrap. He has extensive erythema and blistering superiorly in the left leg. Very tender. Very swollen. Edema in his foot with leaking edema fluid. He has not been systemically unwell 07/22/16; the area on the left leg laterally required some debridement. The medial wounds look more stable. His wrap injury wounds appear to have healed. Edema and his foot is better, weeping edema is also better. He tells me he is meeting with the supplier of the external compression pumps at work 08/05/16; [place] His wrap is been on for an extended period of time. Also over the weekend he developed an extensive area of tender erythema across his anterior medial thigh. He took to doxycycline yesterday that he had leftover from a previous prescription. The patient complains of weeping edema coming out of his toes 08/08/16; I saw this patient on 10/2. He was tender across his anterior thigh. I put him on doxycycline. He returns today in follow-up. He does not have any open wounds on his lower leg, he still has edema weeping into his toes. 08/12/16; patient was seen back urgently today to  follow-up for his extensive left thigh cellulitis/erysipelas. He comes back with a lot less swelling and erythema pain is much better. I believe I gave him Augmentin and Cipro. His wrap was cut down as he stated a roll down his legs. He developed blistering above the level of the wrap that remained. He has 2 open blisters and 1 intact. 08/19/16; patient is been doing his primary doctor who is increased his Lasix from 40-80 once a day or 80 already has less edema. Cellulitis has remained improved in the left thigh. 2 open areas on the posterior left calf 08/26/16; he returns today having new open blisters on the anterior part of his left leg. He has his compression pumps but is not yet been shown how to use some vital representative from the supplier. 09/02/16 patient returns today with no open wounds on the left leg. Some maceration in his plantar toes 09/10/2016 -- Dr. Leanord Hawkingobson had recently discharged him on 09/02/2016 and he has come right back with redness swelling and some open ulcers on his left lower extremity. He says this was caused by trying to apply his compression stockings and he's been unable to use this and has not been able to use his lymphedema pumps. He had some doxycycline leftover and he has started on this a few days ago. 09/16/16; there are no open wounds on his leg on the left and no evidence of cellulitis. 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 He does not complain of symptoms here. I am not clear use using his external compression pumps. 09/23/16; I have not seen this patient in 2 weeks. He canceled his appointment 10 days ago as he was going on vacation. He tells me that on Monday he noticed a large area on his posterior left leg which is been draining copiously and is reopened into a large wound. He is been using ABDs and the external part of his juxtalite, according to our nurse this was not on properly. 10/07/16;  Still a substantial area on the posterior left leg. Using silver alginate 10/14/16; in general better although there is still open area which looks healthy. Still using silver alginate. He reminds me that this happen [place] T oday while he was showering in the morning. He had been using his juxtalite's 10/21/16; the area on his posterior left leg is fully epithelialized. However he arrives today with a large area of tender erythema in his medial and posterior left thigh just above the knee. I have marked the area. Once again he is reluctant to consider hospitalization. I treated him with oral antibiotics in the past for a similar situation with resolution I think with doxycycline however this area it seems more extensive to me. He is not complaining of fever but does have chills and says states he is thirsty. His blood sugar today was in the 140s at home 10/25/16 the area on his posterior left leg is fully epithelialized although there is still some weeping edema. The large area of tenderness and erythema in his medial and posterior left thigh is a lot less tender although there is still a lot of swelling in this thigh. He states he feels a lot better. He is on doxycycline and Augmentin that I started last week. This will continued until Tuesday, December 26. I have ordered a duplex ultrasound of the left thigh rule out DVT whether there is an abscess something that would need to be drained I would also like to know. 11/01/16; he still has weeping edema from a not fully epithelialized area on his left posterior calf. Most of the rest of this looks a lot better. He has completed his antibiotics. His thigh is a lot better. Duplex ultrasound did not show a DVT in the thigh 11/08/16; he comes in today with more Denuded surface epithelium from the posterior aspect of his calf. There is no real evidence of cellulitis. The superior aspect of his wrap appears to have put quite an  indentation in his leg just below the knee and this may have contributed. He does not complain of pain or fever. We have been using silver alginate as the primary dressing. The area of cellulitis in the right thigh has totally resolved. He has been using his compression stockings once a week 11/15/16; the patient arrives today with more loss of epithelium from the posterior aspect of his left calf. He now has a fairly substantial wound in this area. The reason behind this deterioration isn't exactly clear although his edema is not well controlled. He states he feels he is generally more swollen systemically. He is not complaining of chest pain shortness of breath fever. T me he has an appointment with his primary physician in early February. He is on 80 mg of oral ells Lasix a day. He claims compliance with the external compression pumps. He is not having any pain in his legs similar to what he has with his recurrent cellulitis 11/22/16; the patient arrives a follow-up of his large area on his left lateral calf. This looks somewhat better today. He came in earlier in the week for a dressing change since I saw him a week ago. He is not complaining of any pain no shortness of breath no chest pain 11/28/16; the patient arrives for follow-up of his large area on the left lateral calf this does not look better. In fact it is larger weeping edema. The surface of the wound does not look too bad. We have been using silver alginate although I'm not certain that this is a dressing issue. 12/05/16; again the patient follows up for a large wound on the left lateral and left posterior calf this does not look better. There  continues to be weeping edema necrotic surface tissue. More worrisome than this once again there is erythema below the wound involving the distal Achilles and heel suggestive of cellulitis. He is on his feet working most of the day of this is not going well. We are changing his dressing twice a week to  facilitate the drainage. 12/12/16; not much change in the overall dimensions of the large area on the left posterior calf. This is very inflamed looking. I gave him an. Doxycycline last week does not really seem to have helped. He found the wrap very painful indeed it seems to of dog into his legs superiorly and perhaps around the heel. He came in early today because the drainage had soaked through his dressings. 12/19/16- patient arrives for follow-up evaluation of his left lower extremity ulcers. He states that he is using his lymphedema pumps once daily when there is "no drainage". He admits to not using his lipedema pumps while under current treatment. His blood sugars have been consistently between 150-200. 12/26/16; the patient is not using his compression pumps at home because of the wetness on his feet. I've advised him that I think it's important for him to use this daily. He finds his feet too wet, he can put a plastic bag over his legs while he is in the pumps. Otherwise I think will be in a vicious circle. We are using silver alginate to the major area on his left posterior calf 01/02/17; the patient's posterior left leg has further of all into 3 open wounds. All of them covered with a necrotic surface. He claims to be using his compression pumps once a day. His edema control is marginal. Continue with silver alginate 01/10/17; the patient's left posterior leg actually looks somewhat better. There is less edema, less erythema. Still has 3 open areas covered with a necrotic surface requiring debridement. He claims to be using his compression pumps once a day his edema control is better 01/17/17; the patient's left posterior calf look better last week when I saw him and his wrap was changed 2 days ago. He has noted increasing pain in the left heel and arrives today with much larger wounds extensive erythema extending down into the entire heel area especially tender medially. He is not  systemically unwell CBGs have been controlled no fever. Our intake nurse showed me limegreen drainage on his AVD pads. 01/24/17; his usual this patient responds nicely to antibiotics last week giving him Levaquin for presumed Pseudomonas. The whole entire posterior part of his leg is much better much less inflamed and in the case of his Achilles heel area much less tender. He has also had some epithelialization posteriorly there are still open areas here and still draining but overall considerably better 01/31/17- He has continue to tolerate the compression wraps. he states that he continues to use the lymphedema pumps daily, and can increase to twice daily on the weekends. He is voicing no complaints or concerns regarding his LLE ulcers 02/07/17-he is here for follow-up evaluation. He states that he noted some erythema to the left medial and anterior thigh, which he states is new as of yesterday. He is concerned about recurrent cellulitis. He states his blood sugars have been slightly elevated, this morning in the 180s 02/14/17; he is here for follow-up evaluation. When he was last here there was erythema superiorly from his posterior wound in his anterior thigh. He was prescribed Levaquin however a culture of the wound surface grew MRSA over  the phone I changed him to doxycycline on Monday and things seem to be a lot better. 02/24/17; patient missed his appointment on Friday therefore we changed his nurse visit into a physician visit today. Still using silver alginate on the large area of the posterior left thigh. He isn't new area on the dorsal left second toe 03/03/17; actually better today although he admits he has not used his external compression pumps in the last 2 days or so because of work responsibilities over the weekend. 03/10/17; continued improvement. External compression pumps once a day almost all of his wounds have closed on the posterior left calf. Better edema control 03/17/17; in general  improved. He still has 3 small open areas on the lateral aspect of his left leg however most of the area on the posterior part of his leg is epithelialized. He has better edema control. He has an ABD pad under his stocking on the right anterior lower leg although he did not let us look at that today. 03/24/17; patient arrives back in clinic today with no open areas however there are areas on the posterior left calf and anterior left calf that are less than 100% epithelialized. His edema is well controlled in the left lower leg. There is some pitting edema probably lymphedema in the left upper thigh. He uses compression pumps at home once per day. I tried to get him to do this twice a day although he is very reticent. 04/01/2017 -- for the last 2 days he's had significant redness, tenderness and weeping and came in for an urgent visit today. 04/07/17; patient still has 6 more days of doxycycline. He was seen by Dr. Meyer Russel last Wednesday for cellulitis involving the posterior aspect, lateral aspect of his Involving his heel. For the most part he is better there is less erythema and less weeping. He has been on his feet for 12 hours 2 over the weekend. Using his compression pumps once a day 04/14/17 arrives today with continued improvement. Only one area on the posterior left calf that is not fully epithelialized. He has intense bilateral venous inflammation associated with his chronic venous insufficiency disease and secondary lymphedema. We have been using silver alginate to the left posterior calf wound In passing he tells [place] today that the right leg but we have not seen in quite some time has an open area on it but he doesn't want [place] to look at this today states he will show this to [place] next week. 04/21/17; there is no open area on his left leg although he still reports some weeping edema. He showed [place] his right leg today which is the first time we've seen this leg in a long time. He has a large area of  open wound on the right leg anteriorly healthy granulation. Quite a bit of swelling in the right leg and some degree of venous inflammation. He told us about the right leg in passing last week but states that deterioration in the right leg really only happened over the weekend 04/28/17; there is no open area on the left leg although there is an irritated part on the posterior which is like a wrap injury. The wound on the right leg which was new from last week at least to [place] is a lot better. 05/05/17; still no open area on the left leg. Patient is using his new compression stocking which seems to be doing a good job of controlling the edema. He states he is using his  compression pumps once per day. The right leg still has an open wound although it is better in terms of surface area. Required debridement. A lot of pain in the posterior right Achilles marked tenderness. Usually this type of presentation this patient gives concern for an active cellulitis 05/12/17; patient arrives today with his major wound from last week on the right lateral leg somewhat better. Still requiring debridement. He was using his compression stocking on the left leg however that is reopened with superficial wounds anteriorly he did not have an open wound on this leg previously. He is still using his juxta light's once daily at night. He cannot find the time to do this in the morning as he has to be at work by 7 AM 05/19/17; right lateral leg wound looks improved. No debridement required. The concerning area is on the left posterior leg which appears to almost have a subcutaneous hemorrhagic component to it. We've been using silver alginate to all the wounds 05/26/17; the right lateral leg wound continues to look improved. However the area on the left posterior calf is a tightly adherent surface. [place] using silver alginate. Because of the weeping edema in his legs there is very little good alternatives. 06/02/17; the patient left  here last week looking quite good. Major wound on the left posterior calf and a small one on the right lateral calf. Both of these look satisfactory. He tells me that by Wednesday he had noted increased pain in the left leg and drainage. He called on Thursday and Friday to get an appointment here but we were blocked. He did not go to urgent care or his primary physician. He thinks he had a fever on Thursday but did not actually take his temperature. He has not been using his compression pumps on the left leg because of pain. I advised him to go to the emergency room today for IV antibiotics for stents of left leg cellulitis but he has refused I have asked him to take 2 days off work to keep his leg elevated and he has refused this as well. In view of this I'm going to call him and Augmentin and doxycycline. 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 However after a detailed discussion regarding the risk benefits and alternatives he refuses to get admitted to the hospital. With no other recourse I will continue him on oral antibiotics as before and hopefully he'll have his infectious disease consultation this week. 06/16/2017 -- the patient was seen today by the nurse practitioner at infectious disease Ms. Dixon. 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 He has also been advise Lotrimin cream locally. she also had wise IV antibiotics for his condition if it flares up 06/23/17; patient arrives today with drainage bilaterally although the remaining wound on the left posterior calf after  cleaning up today "highlighter yellow drainage" did not look too bad. Unfortunately he has had breakdown on the right anterior leg [previously this leg had not been open and he is using a black stocking] he went to see infectious  disease and is been put on clindamycin 150 mg daily, I did not verify the dose although I'm not familiar with using clindamycin in this dosing range, perhaps for prophylaxisoo 06/27/17; I brought this patient back today to follow-up on the wound deterioration on the right lower leg together with surrounding cellulitis. I started him on doxycycline 4 days ago. This area looks better however he comes in today with intense cellulitis on the medial part of his left thigh. This is not have a wound in this area. Extremely tender. We've been using silver alginate to the wounds on the right lower leg left lower leg with bilateral 4 layer compression he is using his external compression pumps once a day 07/04/17; patient's left medial thigh cellulitis looks better. He has not been using his compression pumps as his insert said it was contraindicated with cellulitis. His right leg continues to make improvements all the wounds are still open. We only have one remaining wound on the left posterior calf. Using silver alginate to all open areas. He is on doxycycline which I started a week ago and should be finishing I gave him Augmentin after Thursday's visit for the severe cellulitis on the left medial thigh which fortunately looks better 07/14/17; the patient's left medial thigh cellulitis has resolved. The cellulitis in his right lower calf on the right also looks better. All of his wounds are stable to improved we've been using silver alginate he has completed the antibiotics I have given him. He has clindamycin 150 mg once a day prescribed by infectious disease for prophylaxis, I've advised him to start this now. We have been using bilateral Unna boots over silver alginate to the wound  areas 07/21/17; the patient is been to see infectious disease who noted his recurrent problems with cellulitis. He was not able to tolerate prophylactic clindamycin therefore he is on amoxicillin 500 twice a day. He also had a second daily dose of Lasix added By Dr. Oneta Rack but he is not taking this. Nor is he being completely compliant with his compression pumps a especially not this week. He has 2 remaining wounds one on the right posterior lateral lower leg and one on the left posterior medial lower leg. 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 The patient has Unna boots bilaterally. 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 He has bilateral Unna boots for compression and silver alginate to his wounds. Arrives today with his legs looking as good as I have seen him in quite some time. Not surprisingly his wounds look better as well with improvement on the right lateral leg venous insufficiency wound and also the left medial leg. He is still using the compression pumps once a day 08/11/17; both legs appear to be doing better wounds on the right lateral and left medial legs look better. Skin on the right leg quite good. He is been using silver alginate as the primary dressing. I'm going to use Anasept gel calcium alginate and maintain all the secondary dressings 08/18/17; the patient continues to actually do quite well. The area on his right lateral leg is just about closed the left medial also looks better although it is still moist in this area. His edema is well controlled we have been using Anasept gel with calcium alginate  and the usual secondary dressings, 4 layer compression and once daily use of his compression pumps "always been able to manage 09/01/17; the patient  continues to do reasonably well in spite of his trip to [place]. The area on the right lateral leg is epithelialized. Left is much better but still open. He has more edema and more chronic erythema on the left leg [venous inflammation] 09/08/17; he arrives today with no open wound on the right lateral leg and decently controlled edema. Unfortunately his left leg is not nearly as in his good situation as last week.he apparently had increasing edema starting on Saturday. He edema soaked through into his foot so used a plastic bag to walk around his home. The area on the medial right leg which was his open area is about the same however he has lost surface epithelium on the left lateral which is new and he has significant pain in the Achilles area of the left foot. He is already on amoxicillin chronically for prophylaxis of cellulitis in the left leg 09/15/17; he is completed a week of doxycycline and the cellulitis in the left posterior leg and Achilles area is as usual improved. He still has a lot of edema and fluid soaking through his dressings. There is no open wound on the right leg. He saw infectious disease NP today 09/22/17;As usual 1 we transition him from our compression wraps to his stockings things did not go well. He has several small open areas on the right leg. He states this was caused by the compression wrap on his skin although he did not wear this with the stockings over them. He has several superficial areas on the left leg medially laterally posteriorly. He does not have any evidence of active cellulitis especially involving the left Achilles The patient is traveling from [facility] Saturday going to [facility]. He states he isn't attempting to get an appointment with a heel objects wound center there to change his dressings. I am not completely certain whether this will work 10/06/17; the patient came in on Friday for a nurse visit and the nurse reported that his legs actually look  quite good. He arrives in clinic today for his regular follow-up visit. He has a new wound on his left third toe over the PIP probably caused by friction with his footwear. He has small areas on the left leg and a very superficial but epithelialized area on the right anterior lateral lower leg. Other than that his legs look as good as I've seen him in quite some time. We have been using silver alginate Review of systems; no chest pain no shortness of breath other than this a 10 point review of systems negative 10/20/17; seen by Dr. Meyer Russel last week. He had taken some antibiotics [doxycycline] that he had left over. Dr. Meyer Russel thought he had candida infection and declined to give him further antibiotics. He has a small wound remaining on the right lateral leg several areas on the left leg including a larger area on the left posterior several left medial and anterior and a small wound on the left lateral. The area on the left dorsal third toe looks a lot better. ROS; Gen.; no fever, respiratory no cough no sputum Cardiac no chest pain other than this 10 point review of system is negative 10/30/17; patient arrives today having fallen in the bathtub 3 days ago. It took him a while to get up. He has pain and maceration in the wounds on his  left leg which have deteriorated. He has not been using his pumps he also has some maceration on the right lateral leg. 11/03/17; patient continues to have weeping edema especially in the left leg. This saturates his dressings which were just put on on 12/27. As usual the doxycycline seems to take care of the cellulitis on his lower leg. He is not complaining of fever, chills, or other systemic symptoms. He states his leg feels a lot better on the doxycycline I gave him empirically. He also apparently gets injections at his primary doctor's officeo Rocephin for cellulitis prophylaxis. I didn't ask him about his compression pump compliance today I think that's probably  marginal. Arrives in the clinic with all of his dressings primary and secondary macerated full of fluid and he has bilateral edema 11/10/17; the patient's right leg looks some better although there is still a cluster of wounds on the right lateral. The left leg is inflamed with almost circumferential skin loss medially to laterally although we are still maintaining anteriorly. He does not have overt cellulitis there is a lot of drainage. He is not using compression pumps. We have been using silver alginate to the wound areas, there are not a lot of options here 11/17/17; the patient's right leg continues to be stable although there is still open wounds, better than last week. The inflammation in the left leg is better. Still loss of surface layer epithelium especially posteriorly. There is no overt cellulitis in the amount of edema and his left leg is really quite good, tells me he is using his compression pumps once a day. 11/24/17; patient's right leg has a small superficial wound laterally this continues to improve. The inflammation in the left leg is still improving however we have continuous surface layer epithelial loss posteriorly. There is no overt cellulitis in the amount of edema in both legs is really quite good. He states he is using his compression pumps on the left leg once a day for 5 out of 7 days 12/01/17; very small superficial areas on the right lateral leg continue to improve. Edema control in both legs is better today. He has continued loss of surface epithelialization and left posterior calf although I think this is better. We have been using silver alginate with large number of absorptive secondary dressings 4 layer on the left Unna boot on the right at his request. He tells me he is using his compression pumps once a day 12/08/17; he has no open area on the right leg is edema control is good here. On the left leg however he has marked erythema and tenderness breakdown of skin. He has  what appears to be a wrap injury just distal to the popliteal fossa. 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 There was swelling erythema and breakdown in the right leg. He has much worse skin breakdown in the left leg as well multiple open areas medially and posteriorly as well as laterally. He tells me he has been using his compression pumps but tells me he feels that the drainage out of his leg is worse when he uses a compression pumps. T be fair to him he is been saying this o for a while however I  don't know that I have really been listening to this. I wonder if the compression pumps are working properly 12/22/17;. Once again he arrives with severe erythema, weeping edema from the left greater than right leg. Noncompliance with compression pumps. New this visit he is complaining of pain on the lateral aspect of the right leg and the medial aspect of his right thigh. He apparently saw his cardiologist Dr. [name] who was ordered an echocardiogram area and I think this is a step in the right direction 12/25/17; started his doxycycline Monday night. There is still intense erythema of the right leg especially in the anterior thigh although there is less tenderness. The erythema around the wound on the right lateral calf also is less tender. He still complaining of pain in the left heel. His wounds are about the same right lateral left medial left lateral. Superficial but certainly not close to closure. He denies being systemically unwell no fever chills no abdominal pain no diarrhea 12/29/17; back in follow-up of his extensive right calf and right thigh cellulitis. I added amoxicillin to cover possible doxycycline resistant  strep. This seems to of done the trick he is in much less pain there is much less erythema and swelling. He has his echocardiogram at 11:00 this morning. X-ray of the left heel was also negative. 01/05/18; the patient arrived with his edema under much better control. Now that he is retired he is able to use his compression pumps daily and sometimes twice a day per the patient. He has a wound on the right leg the lateral wound looks better. Area on the left leg also looks a lot better. He has no evidence of cellulitis in his bilateral thighs I had a quick peak at his echocardiogram. He is in normal ejection fraction and normal left ventricular function. He has moderate pulmonary hypertension moderately reduced right ventricular function. One would have to wonder about chronic sleep apnea although he says he doesn't snore. He'll review the echocardiogram with his cardiologist. 01/12/18; the patient arrives with the edema in both legs under exemplary control. He is using his compression pumps daily and sometimes twice daily. His wound on the right lateral leg is just about closed. He still has some weeping areas on the posterior left calf and lateral left calf although everything is just about closed here as well. I have spoken with Aldean Baker who is the patient's nurse practitioner and infectious disease. She was concerned that the patient had not understood that the parenteral penicillin injections he was receiving for cellulitis prophylaxis was actually benefiting him. I don't think the patient actually saw that I would tend to agree we were certainly dealing with less infections although he had a serious one last month. 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 He is healed. He'll be placed in juxtalite compression wraps and increase his lymphedema pumps to twice daily. We will follow up again next week to ensure there are no issues with the new  regiment. 01/20/18-he is here for evaluation of bilateral lower extremity weeping edema. Yesterday he was placed in compression wrap to the right lower extremity and compression stocking to left lower shrubbery. He states he uses lymphedema pumps last night and again this morning and noted a blister to the left lower extremity. On exam he was noted to have drainage to the right lower extremity. He will be placed in Unna boots bilaterally and follow-up next week 01/26/18; patient was actually discharged a  week ago to his own juxta light stockings only to return the next day with bilateral lower extremity weeping edema.he was placed in bilateral Unna boots. He arrives today with pain in the back of his left leg. There is no open area on the right leg however there is a linear/wrap injury on the left leg and weeping edema on the left leg posteriorly. I spoke with infectious disease about 10 days ago. They were disappointed that the patient elected to discontinue prophylactic intramuscular penicillin shots as they felt it was particularly beneficial in reducing the frequency of his cellulitis. I discussed this with the patient today. He does not share this view. He'll definitely need antibiotics today. Finally he is traveling to [place] and trauma leaving this Saturday and returning a week later and he does not travel with his pumps. He is going by car 01/30/18; patient was seen 4 days ago and brought back in today for review of cellulitis in the left leg posteriorly. I put him on amoxicillin this really hasn't helped as much as I might like. He is also worried because he is traveling to [facility] by car. Finally we will be rewrapping him. There is no open area on the right leg over his left leg has multiple weeping areas as usual 02/09/18; The same wrap on for 10 days. He did not pick up the last doxycycline I prescribed for him. He apparently took 4 days worth he already had. There is nothing open on  his right leg and the edema control is really quite good. He's had damage in the left leg medially and laterally especially probably related to the prolonged use of Unna boots 02/12/18; the patient arrived in clinic today for a nurse visit/wrap change. He complained of a lot of pain in the left posterior calf. He is taking doxycycline that I previously prescribed for him. Unfortunately even though he used his stockings and apparently used to compression pumps twice a day he has weeping edema coming out of the lateral part of his right leg. This is coming from the lower anterior lateral skin area. 02/16/18; the patient has finished his doxycycline and will finish the amoxicillin 2 days. The area of cellulitis in the left calf posteriorly has resolved. He is no longer having any pain. He tells me he is using his compression pumps at least once a day sometimes twice. 02/23/18; the patient finished his doxycycline and Amoxil last week. On Friday he noticed a small erythematous circle about the size of a quarter on the left lower leg just above his ankle. This rapidly expanded and he now has erythema on the lateral and posterior part of the thigh. This is bright red. Also has an area on the dorsal foot just above his toes and a tender area just below the left popliteal fossa. He came off his prophylactic penicillin injections at his own insistence one or 2 months ago. This is obviously deteriorated since then 03/02/18; patient is on doxycycline and Amoxil. Culture I did last week of the weeping area on the back of his left calf grew group B strep. I have therefore renewed the amoxicillin 500 3 times a day for a further week. He has not been systemically unwell. Still complaining of an area of discomfort right under his left popliteal fossa. There is no open wound on the right leg. He tells me that he is using his pumps twice a day on most days 03/09/18; patient arrives in clinic today completing  his amoxicillin  today. The cellulitis on his left leg is better. Furthermore he tells me that he had intramuscular penicillin shots that his primary care office today. However he also states that the wrap on his right leg fell down shortly after leaving clinic last week. He developed a large blister that was present when he came in for a nurse visit later in the week and then he developed intense discomfort around this area.He tells me he is using his compression pumps 03/16/18; the patient has completed his doxycycline. The infectious part of this/cellulitis in the left heel area left popliteal area is a lot better. He has 2 open areas on the right calf. Still areas on the left calf but this is a lot better as well. 03/24/18; the patient arrives complaining of pain in the left popliteal area again. He thinks some of this is wrap injury. He has no open area on the right leg and really no open area on the left calf either except for the popliteal area. He claims to be compliant with the compression pumps 03/31/18; I gave him doxycycline last week because of cellulitis in the left popliteal area. This is a lot better although the surface epithelium is denuded off and response to this. He arrives today with uncontrolled edema in the right calf area as well as a fingernail injury in the right lateral calf. There is only a few open areas on the left 04/06/18; I gave him amoxicillin doxycycline over the last 2 weeks that the amoxicillin should be completing currently. He is not complaining of any pain or systemic symptoms. The only open areas see has is on the right lateral lower leg paradoxically I cannot see anything on the left lower leg. He tells me he is using his compression pumps twice a day on most days. Silver alginate to the wounds that are open under 4 layer compression 04/13/18; he completed antibiotics and has no new complaints. Using his compression pumps. Silver alginate that anything that's opened 04/20/18; he is  using his compression pumps religiously. Silver alginate 4 layer compression anything that's opened. He comes in today with no open wounds on the left leg but 3 on the right including a new one posteriorly. He has 2 on the right lateral and one on the right posterior. He likes Unna boots on the right leg for reasons that aren't really clear we had the usual 4 layer compression on the left. It may be necessary to move to the 4 layer compression on the right however for now I left them in the Unna boots 04/27/18; he is using his compression pumps at least once a day. He has still the wounds on the right lateral calf. The area right posteriorly has closed. He does not have an open wound on the left under 4 layer compression however on the dorsal left foot just proximal to the toes and the left third toe 2 small open areas were identified 05/11/18; he has not uses compression pumps. The areas on the right lateral calf have coalesced into one large wound necrotic surface. On the left side he has one small wound anteriorly however the edema is now weeping out of a large part of his left leg. He says he wasn't using his pumps because of the weeping fluid. I explained to him that this is the time he needs to pump more 05/18/18; patient states he is using his compression pumps twice a day. The area on the right lateral  large wound albeit superficial. On the left side he has innumerable number of small new wounds on the left calf particularly laterally but several anteriorly and medially. All these appear to have healthy granulated base these look like the remnants of blisters however they occurred under compression. The patient arrives in clinic today with his legs somewhat better. There is certainly less edema, less multiple open areas on the left calf and the right anterior leg looks somewhat better as well superficial and a little smaller. However he relates pain and erythema over the last 3-4 days in the thigh  and I looked at this today. He has not been systemically unwell no fever no chills no change in blood sugar values 05/25/18; comes in today in a better state. The severe cellulitis on his left leg seems better with the Keflex. Not as tender. He has not been systemically unwell Hard to find an open wound on the left lower leg using his compression pumps twice a day The confluent wounds on his right lateral calf somewhat better looking. These will ultimately need debridement I didn't do this today. 06/01/18; the severe cellulitis on the left anterior thigh has resolved and he is completed his Keflex. There is no open wound on the left leg however there is a superficial excoriation at the base of the third toe dorsally. Skin on the bottom of his left foot is macerated looking. The left the wounds on the lateral right leg actually looks some better although he did require debridement of the top half of this wound area with an open curet 06/09/18 on evaluation today patient appears to be doing poorly in regard to his right lower extremity in particular this appears to likely be infected he has very thick purulent discharge along with a bright green tent to the discharge. This makes me concerned about the possibility of pseudomonas. He's also having increased discomfort at this point on evaluation. Fortunately there does not appear to be any evidence of infection spreading to the other location at this time. 06/16/18 on evaluation today patient appears to actually be doing fairly well. His ulcer has actually diminished in size quite significantly at this point which is good news. Nonetheless he still does have some evidence of infection he did see infectious disease this morning before coming here for his appointment. I did review the results of their evaluation and their note today. They did actually have him discontinue the Cipro and initiate treatment with linezolid at this time. He is doing this for the  next seven days and they recommended a follow-up in four months with them. He is the keep a log of the need for intermittent antibiotic therapy between now and when he falls back up with infectious disease. This will help them gaze what exactly they need to do to try and help them out. 06/23/18; the patient arrives today with no open wounds on the left leg and left third toe healed. He is been using his compression pumps twice a day. On the right lateral leg he still has a sizable wound but this is a lot better than last time I saw this. 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 Has been using silver alginate under 4 layer compression 06/30/18; the only open wound he has is on the right lateral leg and this looks healthy. No debridement is required. We have been using silver  alginate. He does not have an open wound on the left leg. There is apparently some drainage from the dorsal proximal third toe on the left although I see no open wound here. 07/03/18 on evaluation today patient was actually here just for a nurse visit rapid change. However when he was here on Wednesday for his rat change due to having been healed on the left and then developing blisters we initiated the wrap again knowing that he would be back today for [place] to reevaluate and see were at. Unfortunately he has developed some cellulitis into the proximal portion of his right lower extremity even into the region of his thigh. 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 He was placed on one as what at that point. Nonetheless he is done with that and has been tolerating it well otherwise. Doxycycline which in the past really did not seem to be effective for him. Nonetheless I think the best option may be for [place] to definitely reinitiate the antibiotics for a longer period of time. 07/07/18; since I  last saw this patient a week ago he has had a difficult time. At that point he did not have an open wound on his left leg. We transitioned him into juxta light stockings. He was apparently in the clinic the next day with blisters on the left lateral and left medial lower calf. He also had weeping edema fluid. He was put back into a compression wrap. He was also in the clinic on Friday with intense erythema in his right thigh. Per the patient he was started on Bactrim however that didn't work at all in terms of relieving his pain and swelling. He has taken 3 doxycycline that he had left over from last time and that seems to of helped. He has blistering on the right thigh as well. 07/14/18; the erythema on his right thigh has gotten better with doxycycline that he is finishing. 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 Presumably this cellulitis in the thigh was not related to that although I think this is a bit concerning going forward. He still has an area on the right lateral calf the blister on the right medial calf just below the knee that was discussed above. On the left 2 small open areas left medial and left lateral. Edema control is adequate. He is using his compression pumps twice a day 07/20/18; continued improvement in the condition of both legs especially the edema in his bilateral thighs. He tells me he is been losing weight through a combination of diet and exercise. He is using his compression pumps twice a day. So overall she made to the remaining wounds 07/27/2018; continued improvement in condition of both legs. His edema is well controlled. The area on the right lateral leg is just about closed he had one blisters show up on the medial left upper calf. We have him in 4 layer compression. He is going on a 10-day trip to [place], [place] and [place]. He will be driving. He wants to wear Unna boots because of  the lessening amount of constriction. He will not use compression pumps while he is away 08/05/18 on evaluation today patient actually appears to be doing decently well all things considered in regard to his bilateral lower extremities. The worst ulcer is actually only posterior aspect of his left lower  extremity with a four layer compression wrap cut into his leg a couple weeks back. He did have a trip and actually had [employer] for the trip that he is worn since he was last here. Nonetheless he feels like the [employer] actually do better for him his swelling is up a little bit but he also with his trip was not taking his Lasix on a regular set schedule like he was supposed to be. He states that obviously the reason being that he cannot drive and keep going without having to urinate too frequently which makes it difficult. He did not have his pumps with him while he was away either which I think also maybe playing a role here too. 08/13/2018; the patient only has a small open wound on the right lateral calf which is a big improvement in the last month or 2. He also has the area posteriorly just below the posterior fossa on the left which I think was a wrap injury from several weeks ago. He has no current evidence of cellulitis. He tells me he is back into his compression pumps twice a day. He also tells me that while he was at the laundromat somebody stole a section of his extremitease stockings 08/20/2018; back in the clinic with a much improved state. He only has small areas on the right lateral mid calf which is just about healed. This was is more substantial area for quite a prolonged period of time. He has a small open area on the left anterior tibia. The area on the posterior calf just below the popliteal fossa is closed today. He is using his compression pumps twice a day 08/28/2018; patient has no open wound on the right leg. He has a smattering of open areas on the calf with some  weeping lymphedema. More problematically than that it looks as though his wraps of slipped down in his usual he has very angry upper area of edema just below the right medial knee and on the right lateral calf. He has no open area on his feet. The patient is traveling to [facility] next week. I will send him in an antibiotic. We will continue to wrap the right leg. We ordered extremitease stockings for him last week and I plan to transition the right leg to a stocking when he gets home which will be in 10 days time. As usual he is very reluctant to take his pumps with him when he travels 09/07/2018; patient returns from [facility]. He shows me a picture of his left leg in the mid part of his trip last week with intense fire engine erythema. The picture look bad enough I would have considered sending him to the hospital. Instead he went to the wound care center in [facility]. They did not prescribe him antibiotics but he did take some doxycycline he had leftover from a previous visit. I had given him trimethoprim sulfamethoxazole before he left this did not work according to the patient. This is resulted in some improvement fortunately. He comes back with a large wound on the left posterior calf. Smaller area on the left anterior tibia. Denuded blisters on the dorsal left foot over his toes. Does not have much in the way of wounds on the right leg although he does have a very tender area on the right posterior area just below the popliteal fossa also suggestive of infection. He promises me he is back on his pumps twice a  day 09/15/2018; the intense cellulitis in his left lower calf is a lot better. The wound area on the posterior left calf is also so better. However he has reasonably extensive wounds on the dorsal aspect of his second and third toes and the proximal foot just at the base of the toes. There is nothing open on the right leg 09/22/2018; the patient has excellent edema  control in his legs bilaterally. He is using his external compression pumps twice a day. He has no open area on the right leg and only the areas in the left foot dorsally second and third toe area on the left side. He does not have any signs of active cellulitis. 10/06/2018; the patient has good edema control bilaterally. He has no open wound on the right leg. There is a blister in the posterior aspect of his left calf that we had to deal with today. He is using his compression pumps twice a day. There is no signs of active cellulitis. We have been using silver alginate to the wound areas. He still has vulnerable areas on the base of his left first second toes dorsally He has a his extremities stockings and we are going to transition him today into the stocking on the right leg. He is cautioned that he will need to continue to use the compression pumps twice a day. If he notices uncontrolled edema in the right leg he may need to go to 3 times a day. 10/13/2018; the patient came in for a nurse check on Friday he has a large flaccid blister on the right medial calf just below the knee. We unroofed this. He has this and a new area underneath the posterior mid calf which was undoubtedly a blister as well. He also has several small areas on the right which is the area we put his extremities stocking on. 10/19/2018; the patient went to see infectious disease this morning I am not sure if that was a routine follow-up in any case the doxycycline I had given him was discontinued and started on linezolid. He has not started this. It is easy to look at his left calf and the inflammation and think this is cellulitis however he is very tender in the tissue just below the popliteal fossa and I have no doubt that there is infection going on here. He states the problem he is having is that with the compression pumps the edema goes down and then starts walking the wrap falls down. We will see if we can adhere this. He  has 1 or 2 minuscule open areas on the right still areas that are weeping on the posterior left calf, the base of his left second and third toes 10/26/18; back today in clinic with quite of skin breakdown in his left anterior leg. This may have been infection the area below the popliteal fossa seems a lot better however tremendous epithelial loss on the left anterior mid tibia area over quite inexpensive tissue. He has 2 blisters on the right side but no other open wound here. 10/29/2018; came in urgently to see [place] today and we worked him in for review. He states that the 4 layer compression on the right leg caused pain he had to cut it down to roughly his mid calf this caused swelling above the wrap and he has blisters and skin breakdown today. As a result of the pain he has not been using his pumps. Both legs are a lot more edematous and there  is a lot of weeping fluid. 11/02/18; arrives in clinic with continued difficulties in the right leg> left. Leg is swollen and painful. multiple skin blisters and new open areas especially laterally. He has not been using his pumps on the right leg. 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 He is not systemically unwell. 11/09/2018; the patient claims he is being compliant with his pumps. He is finished the doxycycline I gave him last week. Culture I did of the wound on the right lateral leg showed a few very resistant methicillin staph aureus. This was resistant to doxycycline. Nevertheless he states the pain in the leg is a lot better which makes me wonder if the cultured organism was not really what was causing the problem nevertheless this is a very dangerous organism to be culturing out of any wound. His right leg is still a lot larger than the left. He is using an Radio broadcast assistant on this area, he blames a 4-layer compression for causing the original skin breakdown which I doubt is true however I cannot talk him out  of it. We have been using silver alginate to all of these areas which were initially blisters 11/16/2018; patient is being compliant with his external compression pumps at twice a day. Miraculously he arrives in clinic today with absolutely no open wounds. He has better edema control on the left where he has been using 4 layer compression versus wound of wounds on the right and I pointed this out to him. There is no inflammation in the skin in his lower legs which is also somewhat unusual for him. There is no open wounds on the dorsal left foot. He has extremitease stockings at home and I have asked him to bring these in next week. 11/25/18 patient's lower extremity on examination today on the left appears for the most part to be wound free. He does have an open wound on the lateral aspect of the right lower extremity but this is minimal compared to what I've seen in past. He does request that we go ahead and wrap the left leg as well even though there's nothing open just so hopefully it will not reopen in short order. 1/28; patient has superficial open wounds on the right lateral calf left anterior calf and left posterior calf. His edema control is adequate. 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 We have been using silver alginate as the primary dressing. He claims compliance with his compression pumps 2/4; patient has superficial open wounds on numerous areas of his left calf and again one on the left dorsal foot. The areas on the right lateral calf have healed. The cellulitis that I gave him doxycycline for last week is also resolved this was mostly on the left anterior calf just below the tibial tuberosity. His edema looks fairly well-controlled. He tells me he went to see his primary doctor today and had blood work ordered 2/11; once again he has several open areas on the left calf left tibial area. Most of  these are small and appear to have healthy granulation. He does not have anything open on the right. The edema and control in his thighs is pretty good which is usually a good indication he has been using his pumps as requested. 2/18; he continues to have several small areas on the left calf and left tibial area. Most of  these are small healthy granulation. We put him in his stocking on the right leg last week and he arrives with a superficial open area over the right upper tibia and a fairly large area on the right lateral tibia in similar condition. His edema control actually does not look too bad, he claims to be using his compression pumps twice a day 2/25. Continued small areas on the left calf and left tibial area. New areas especially on the right are identified just below the tibial tuberosity and on the right upper tibia itself. There are also areas of weeping edema fluid even without an obvious wound. He does not have a considerable degree of lymphedema but clearly there is more edema here than his skin can handle. He states he is using the pumps twice a day. We have an Unna boot on the right and 4 layer compression on the left. 3/3; he continues to have an area on the right lateral calf and right posterior calf just below the popliteal fossa. There is a fair amount of tenderness around the wound on the popliteal fossa but I did not see any evidence of cellulitis, could just be that the wrap came down and rubbed in this area. He does not have an open area on the left leg however there is an area on the left dorsal foot at the base of the third toe We have been using silver alginate to all wound areas 3/10; he did not have an open area on his left leg last time he was here a week ago. T oday he arrives with a horizontal wound just below the tibial tuberosity and an area on the left lateral calf. He has intense erythema and tenderness in this area. The area is on the right lateral calf and  right posterior calf better than last week. We have been using silver alginate as usual 3/18 - Patient returns with 3 small open areas on left calf, and 1 small open area on right calf, the skin looks ok with no significant erythema, he continues the UNA boot on right and 4 layer compression on left. The right lateral calf wound is closed , the right posterior is small area. we will continue silver alginate to the areas. 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 He is been tolerating the dressing changes without complication. Fortunately she has made excellent improvement in regard to the overall status of his wounds. Unfortunately every time we cease wrapping him he ends up reopening in causing more significant issues at that point. Again I'm unsure of the best direction to take although I think the lymphedema clinic may be appropriate for him. 02/03/19 on evaluation today patient appears to be doing well in regard to the wounds that we saw him for last week unfortunately he has a new area on the proximal portion of his right medial/posterior lower extremity where the wrap somewhat slowed down and caused swelling and a blister to rub and open. Unfortunately this is the only opening that he has on either leg at this point. 02/17/19 on evaluation today patient's bilateral lower extremities appear to be doing well. He still completely healed in regard to the left lower extremity. In regard to the right lower extremity the area where the wrap and slid down and caused the  blister still seems to be slightly open although this is dramatically better than during the last evaluation two weeks ago. I'm very pleased with the way this stands overall. 03/03/19 on evaluation today patient appears to be doing well in regard  to his right lower extremity in general although he did have a new blister open this does not appear to be showing any evidence of active infection at this time. Fortunately there's No fevers, chills, nausea, or vomiting noted at this time. Overall I feel like he is making good progress it does feel like that the right leg will we perform the [employer] seems to do with a bit better than three layer wrap on the left which slid down on him. We may switch to doing bilateral in the book wraps. 5/4; I have not seen Mr. Hotard in quite some time. According to our case manager he did not have an open wound on his left leg last week. He had 1 remaining wound on the right posterior medial calf. He arrives today with multiple openings on the left leg probably were blisters and/or wrap injuries from Unna boots. I do not think the Unna boot's will provide adequate compression on the left. I am also not clear about the frequency he is using the compression pumps. 03/17/19 on evaluation today patient appears to be doing excellent in regard to his lower extremities compared to last week's evaluation apparently. He had gotten significantly worse last week which is unfortunate. The [employer] wrap on the left did not seem to do very well for him at all and in fact it didn't control his swelling significantly enough he had an additional outbreak. Subsequently we go back to the four layer compression wrap on the left. This is good news. At least in that he is doing better and the wound seem to be killing him. He still has not heard anything from the lymphedema clinic. 03/24/19 on evaluation today patient actually appears to be doing much better in regard to his bilateral lower Trinity as compared to last week when I saw him. Fortunately there's no signs of active infection at this time. He has been tolerating the dressing changes without complication. Overall I'm extremely pleased with the progress and appearance in  general. 04/07/19 on evaluation today patient appears to be doing well in regard to his bilateral lower extremities. His swelling is significantly down from where it was previous. With that being said he does have a couple blisters still open at this point but fortunately nothing that seems to be too severe and again the majority of the larger openings has healed at this time. 04/14/19 on evaluation today patient actually appears to be doing quite well in regard to his bilateral lower extremities in fact I'm not even sure there's anything significantly open at this time at any site. Nonetheless he did have some trouble with these wraps where they are somewhat irritating him secondary to the fact that he has noted that the graph wasn't too close down to the end of this foot in a little bit short as well up to his knee. Otherwise things seem to be doing quite well. 04/21/19 upon evaluation today patient's wound bed actually showed evidence of being completely healed in regard to both lower extremities which is excellent news. There does not appear to be any signs of active infection which is also good news. I'm very pleased in this regard. No fevers, chills, nausea, or vomiting noted at this time.  04/28/19 on evaluation today patient appears to be doing a little bit worse in regard to both lower extremities on the left mainly due to the fact that when he went infection disease the wrap was not wrapped quite high enough he developed a blister above this. On the right he is a small open area of nothing too significant but again this is continuing to give him some trouble he has been were in the Velcro compression that he has at home. 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 He's been tolerating the dressing changes without complication. Fortunately there's no signs of active infection at this time. No fevers, chills, nausea, or vomiting noted at  this time. We have been trying to get an appointment with her lymphedema clinic in [facility] but unfortunately nobody can get them on phone with not been able to even fax information over the patient likewise is not been able to get in touch with them. Overall I'm not sure exactly what's going on here with to reach out again today. 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 Still having a lot of drainage unfortunately. He tells me especially in the left but even on the right. There's no signs of active infection which is good news we've been using so ratcheted up to this point. 05/19/19 on evaluation today patient actually appears to be doing quite well with regard to his left lower extremity which is great news. Fortunately in regard to the right lower extremity has an issues with his wrap and he subsequently did remove this from what I'm understanding. Nonetheless long story short is what he had rewrapped once he removed it subsequently had maggots underneath this wrap whenever he came in for evaluation today. With that being said they were obviously completely cleaned away by the nursing staff. The visit today which is excellent news. However he does appear to potentially have some infection around the right ankle region where the maggots were located as well. He will likely require anabiotic therapy today. 05/26/19 on evaluation today patient actually appears to be doing much better in regard to his bilateral lower extremities. I feel like the infection is under much better control. With that being said there were maggots noted when the wrap was removed yet again today. Again this could have potentially been left over from previous although at this time there does not appear to be any signs of significant drainage there was obviously on the wrap some drainage as well this contracted gnats or  otherwise. Either way I do not see anything that appears to be doing worse in my pinion and in fact I think his drainage has slowed down quite significantly likely mainly due to the fact to his infection being under better control. 06/02/2019 on evaluation today patient actually appears to be doing well with regard to his bilateral lower extremities there is no signs of active infection at this time which is great news. With that being said he does have several open areas more so on the right than the left but nonetheless these are all significantly better than previously noted. 06/09/2019 on evaluation today patient actually appears to be doing well. His wrap stayed up and he did not cause any problems he had more drainage on the right compared to the left but overall I do not see any major issues  at this time which is great news. 06/16/2019 on evaluation today patient appears to be doing excellent with regard to his lower extremities the only area that is open is a new blister that can have opened as of today on the medial ankle on the left. Other than this he really seems to be doing great I see no major issues at this point. 06/23/2019 on evaluation today patient appears to be doing quite well with regard to his bilateral lower extremities. In fact he actually appears to be almost completely healed there is a small area of weeping noted of the right lower extremity just above the ankle. Nonetheless fortunately there is no signs of active infection at this time which is good news. No fevers, chills, nausea, vomiting, or diarrhea. 8/24; the patient arrived for a nurse visit today but complained of very significant pain in the left leg and therefore I was asked to look at this. Noted that he did not have an open area on the left leg last week nevertheless this was wrapped. The patient states that he is not been able to put his compression pumps on the left leg because of the discomfort. He has not been  systemically unwell 06/30/2019 on evaluation today patient unfortunately despite being excellent last week is doing much worse with regard to his left lower extremity today. In fact he had to come in for a nurse on Monday where his left leg had to be rewrapped due to excessive weeping Dr. Leanord Hawking placed him on doxycycline at that point. Fortunately there is no signs of active infection Systemically at this time which is good news. 07/07/2019 in regard to the patient's wounds today he actually seems to be doing well with his right lower extremity there really is nothing open or draining at this point this is great news. Unfortunately the left lower extremity is given him additional trouble at this time. There does not appear to be any signs of active infection nonetheless he does have a lot of edema and swelling noted at this point as well as blistering all of which has led to a much more poor appearing leg at this time compared to where it was 2 weeks ago when it was almost completely healed. Obviously this is a little discouraging for the patient. He is try to contact the lymphedema clinic in [place] he has not been able to get through to them. 07/14/2019 on evaluation today patient actually appears to be doing slightly better with regard to his left lower extremity ulcers. Overall I do feel like at least at the top of the wrap that we have been placing this area has healed quite nicely and looks much better. The remainder of the leg is showing signs of improvement. Unfortunately in the thigh area he still has an open region on the left and again on the right he has been utilizing just a Band-Aid on an area that also opened on the thigh. Again this is an area that were not able to wrap although we did do an Ace wrap to provide some compression that something that obviously is a little less effective than the compression wraps we have been using on the lower portion of the leg. He does have an  appointment with the lymphedema clinic in [facility] on Friday. 07/21/2019 on evaluation today patient appears to be doing better with regard to his lower extremity ulcers. He has been tolerating the dressing changes without complication. Fortunately there is no signs of  active infection at this time. No fevers, chills, nausea, vomiting, or diarrhea. I did receive the paperwork from the physical therapist at the lymphedema clinic in [place]. Subsequently I signed off on that this morning and sent that back to him for further progression with the treatment plan. 07/28/2019 on evaluation today patient appears to be doing very well with regard to his right lower extremity where I do not see any open wounds at this point. Fortunately he is feeling great as far as that is concerned as well. In regard to the left lower extremity he has been having issues with still several areas of weeping and edema although the upper leg is doing better his lower leg still I think is going require the compression wrap at this time. No fevers, chills, nausea, vomiting, or diarrhea. 08/04/2019 on evaluation today patient unfortunately is having new wounds on the right lower extremity. Again we have been using Unna boot wrap on that side. We switched him to using his juxta lite wrap at home. With that being said he tells me he has been using it although his legs extremely swollen and to be honest really does not appear that he has been. I cannot know that for sure however. Nonetheless he has multiple new wounds on the right lower extremity at this time. Obviously we will have to see about getting this rewrapped for him today. 08/11/2019 on evaluation today patient appears to be doing fairly well with regard to his wounds. He has been tolerating the dressing changes including the compression wraps without complication. He still has a lot of edema in his upper thigh regions bilaterally he is supposed to be seeing the  lymphedema clinic on the 15th of this month once his wraps arrive for the upper part of his legs. 08/18/2019 on evaluation today patient appears to be doing well with regard to his bilateral lower extremities at this point. He has been tolerating the dressing changes without complication. Fortunately there is no signs of active infection which is also good news. He does have a couple weeping areas on the first and second toe of the right foot he also has just a small area on the left foot upper leg and a small area on the left lower leg but overall he is doing quite well in my opinion. He is supposed to be getting his wraps shortly in fact tomorrow and then subsequently is seeing the lymphedema clinic next Wednesday on the 21st. Of note he is also leaving on the 25th to go on vacation for a week to the beach. For that reason and since there is some uncertainty about what there can be doing at lymphedema clinic next Wednesday I am get a make an appointment for next Friday here for [place] to see what we need to do for him prior to him leaving for vacation. 10/23; patient arrives in considerable pain predominantly in the upper posterior calf just distal to the popliteal fossa also in the wound anteriorly above the major wound. This is probably cellulitis and he has had this recurrently in the past. He has no open wound on the right side and he has had an Radio broadcast assistant in that area. Finally I note that he has an area on the left posterior calf which by enlarge is mostly epithelialized. This protrudes beyond the borders of the surrounding skin in the setting of dry scaly skin and lymphedema. The patient is leaving for [facility] on Sunday. Per his longstanding pattern,  he will not take his compression pumps with him predominantly out of fear that they will be stolen. He therefore asked that we put a Unna boot back on the right leg. He will also contact the wound care center in [facility] to see if they can  change his dressing in the mid week. 11/3; patient returned from his vacation to [facility]. He was seen on 1 occasion at their wound care center. They did a 2 layer compression system as they did not have our 4-layer wrap. I am not completely certain what they put on the wounds. They did not change the Unna boot on the right. The patient is also seeing a lymphedema specialist physical therapist in [place]. It appears that he has some compression sleeve for his thighs which indeed look quite a bit better than I am used to seeing. He pumps over these with his external compression pumps. 11/10; the patient has a new wound on the right medial thigh otherwise there is no open areas on the right. He has an area on the left leg posteriorly anteriorly and medially and an area over the left second toe. We have been using silver alginate. He thinks the injury on his thigh is secondary to friction from the compression sleeve he has. 11/17; the patient has a new wound on the right medial thigh last week. He thinks this is because he did not have a underlying stocking for his thigh juxta lite apparatus. He now has this. The area is fairly large and somewhat angry but I do not think he has underlying cellulitis. He has a intact blister on the right anterior tibial area. Small wound on the right great toe dorsally Small area on the medial left calf. 11/30; the patient does not have any open areas on his right leg and we did not take his juxta lite stocking off. However he states that on Friday his compression wrap fell down lodging around his upper mid calf area. As usual this creates a lot of problems for him. He called urgently today to be seen for a nurse visit however the nurse visit turned into a provider visit because of extreme erythema and pain in the left anterior tibia extending laterally and posteriorly. The area that is problematic is extensive 10/06/2019 upon evaluation today patient actually  appears to be doing poorly in regard to his left lower extremity. He Dr. Leanord Hawking did place him on doxycycline this past Monday apparently due to the fact that he was doing much worse in regard to this left leg. Fortunately the doxycycline does seem to be helping. Unfortunately we are still having a very difficult time getting his edema under any type of control in order to anticipate discharge at some point. The only way were really able to control his lymphedema really is with compression wraps and that has only even seemingly temporary. He has been seeing a lymphedema clinic they are trying to help in this regard but still this has been somewhat frustrating in general for the patient. 10/13/19 on evaluation today patient appears to be doing excellent with regard to his right lower extremity as far as the wounds are concerned. His swelling is still quite extensive unfortunately. He is still having a lot of drainage from the thigh areas bilaterally which is unfortunate. He's been going to lymphedema clinic but again he still really does not have this edema under control as far as his lower extremities are concern. With regard to his left  lower extremity this seems to be improving and I do believe the doxycycline has been of benefit for him. He is about to complete the doxycycline. 10/20/2019 on evaluation today patient appears to be doing poorly in regard to his bilateral lower extremities. More in the right thigh he has a lot of irritation at this site unfortunately. In regard to the left lower extremity the wrap was not quite as high it appears and does seem to have caused him some trouble as well. Fortunately there is no evidence of systemic infection though he does have some blue-green drainage which has me concerned for the possibility of Pseudomonas. He tells me he is previously taking Cipro without complications and he really does not care for Levaquin however due to some of the side effects he  has. He is not allergic to any medications specifically antibiotics that were aware of. 10/27/2019 on evaluation today patient actually does appear to be for the most part doing better when compared to last week's evaluation. With that being said he still has multiple open wounds over the bilateral lower extremities. He actually forgot to start taking the Cipro and states that he still has the whole bottle. He does have several new blisters on left lower extremity today I think I would recommend he go ahead and take the Cipro based on what I am seeing at this point. 12/30-Patient comes at 1 week visit, 4 layer compression wraps on the left and Unna boot on the right, primary dressing Xtrasorb and silver alginate. Patient is taking his Cipro and has a few more days left probably 5-6, and the legs are doing better. He states he is using his compressions devices which I believe he has 11/10/2019 on evaluation today patient actually appears to be much better than last time I saw him 2 weeks ago. His wounds are significantly improved and overall I am very pleased in this regard. Fortunately there is no signs of active infection at this time. He is just a couple of days away from completing Cipro. Overall his edema is much better he has been using his lymphedema pumps which I think is also helping at this point. 11/17/2019 on evaluation today patient appears to be doing excellent in regard to his wounds in general. His legs are swollen but not nearly as much as they have been in the past. Fortunately he is tolerating the compression wraps without complication. No fevers, chills, nausea, vomiting, or diarrhea. He does have some erythema however in the distal portion of his right lower extremity specifically around the forefoot and toes there is a little bit of warmth here as well. 11/24/2019 on evaluation today patient appears to be doing well with regard to his right lower extremity I really do not see any open  wounds at this point. His left lower extremity does have several open areas and his right medial thigh also is open. Other than this however overall the patient seems to be making good progress and I am very pleased at this point. 12/01/2019 on evaluation today patient appears to be doing poorly at this point in regard to his left lower extremity has several new blisters despite the fact that we have him in compression wraps. In fact he had a 4-layer compression wrap, his upper thigh wrapped from lymphedema clinic, and a juxta light over top of the 4 layer compression wrap the lymphedema clinic applied and despite all this he still develop blisters underneath. Obviously this does have me concerned  about the fact that unfortunately despite what we are doing to try to get wounds healed he continues to have new areas arise I do not think he is ever good to be at the point where he can realistically just use wraps at home to keep things under control. Typically when we heal him it takes about 1-2 days before he is back in the clinic with severe breakdown and blistering of his lower extremities bilaterally. This is happened numerous times in the past. Unfortunately I think that we may need some help as far as overall fluid overload to kind of limit what we are seeing and get things under better control. 12/08/2019 on evaluation today patient presents for follow-up concerning his ongoing bilateral lower extremity edema. Unfortunately he is still having quite a bit of swelling the compression wraps are controlling this to some degree but he did see Dr. [name] his cardiologist I do have that available for review today as far as the appointment was concerned that was on 12/06/2019. Obviously that she has been 2 days ago. The patient states that he is only been taking the Lasix 80 mg 1 time a day he had told me previously he was taking this twice a day. Nonetheless Dr. [name] recommended this be up to 80 mg 2 times a  day for the patient as he did appear to be fluid overloaded. With that being said the patient states he did this yesterday and he was unable to go anywhere or do anything due to the fact that he was constantly having to urinate. Nonetheless I think that this is still good to be something that is important for him as far as trying to get his edema under control at all things that he is going to be able to just expect his wounds to get under control and things to be better without going through at least a period of time where he is trying to stabilize his fluid management in general and I think increasing the Lasix is likely the first step here. It was also mentioned the possibility that the patient may require metolazone. With that being said he wanted to have the patient take Lasix twice a day first and then reevaluating 2 months to see where things stand. 12/15/2019 upon evaluation today patient appears to be doing regard to his legs although his toes are showing some signs of weeping especially on the left at this point to some degree on the right. There does not appear to be any signs of active infection and overall I do feel like the compression wraps are doing well for him but he has not been able to take the Lasix at home and the increased dose that Dr. [name] recommended. He tells me that just not go to be feasible for him. Nonetheless I think in this case he should probably send a message to Dr. [name] in order to discuss options from the standpoint of possible admission to get the fluid off or otherwise going forward. 12/22/2019 upon evaluation today patient appears to be doing fairly well with regard to his lower extremities at this point. In fact he would be doing excellent if it was not for the fact that his right anterior thigh apparently had an allergic reaction to adhesive tape that he used. The wound itself that we have been monitoring actually appears to be healed. There is a lot of  irritation at this point. 12/29/2019 upon evaluation today patient appears to be doing well  in regard to his lower extremities. His left medial thigh is open and somewhat draining today but this is the only region that is open the right has done much better with the treatment utilizing the steroid cream that I prescribed for him last week. Overall I am pleased in that regard. Fortunately there is no signs of active infection at this time. No fevers, chills, nausea, vomiting, or diarrhea. 01/05/2020 upon evaluation today patient appears to be doing more poorly in regard to his right lower extremity at this point upon evaluation today. Unfortunately he continues to have issues in this regard and I think the biggest issue is controlling his edema. This obviously is not very well controlled at this point is been recommended that he use the Lasix twice a day but he has not been able to do that. Unfortunately I think this is leading to an issue where honestly he is not really able to effectively control his edema and therefore the wounds really are not doing significantly better. I do not think that he is going to be able to keep things under good control unless he is able to control his edema much better. I discussed this again in great detail with him today. 01/12/2020 good news is patient actually appears to be doing quite well today at this point. He does have an appointment with lymphedema clinic tomorrow. His legs appear healed and the toe on the left is almost completely healed. In general I am very pleased with how things stand at this point. 01/19/2020 upon evaluation today patient appears to actually be doing well in regard to his lower extremities there is nothing open at this point. Fortunately he has done extremely well more recently. Has been seeing lymphedema clinic as well. With that being said he has Velcro wraps for his lower legs as well as his upper legs. The only wound really is on his toe  which is the right great toe and this is barely anything even there. With all that being said I think it is good to be appropriate today to go ahead and switch him over to the Velcro compression wraps. 01/26/2020 upon evaluation today patient appears to be doing worse with regard to his lower extremities after last week switch him to Velcro compression wraps. Unfortunately he lasted less than 24 hours he did not have the sock portion of his Velcro wrap on the left leg and subsequently developed a blister underneath the Velcro portion. Obviously this is not good and not what we were looking for at this point. He states the lymphedema clinic did tell him to wear the wrap for 23 hours and take him off for 1 I am okay with that plan but again right now we got a get things back under control again he may have some cellulitis noted as well. 02/02/2020 upon evaluation today patient unfortunately appears to have several areas of blistering on his bilateral lower extremities today mainly on the feet. His legs do seem to be doing somewhat better which is good news. Fortunately there is no evidence of active infection at this time. No fevers, chills, nausea, vomiting, or diarrhea. 02/16/2020 upon evaluation today patient appears to be doing well at this time with regard to his legs. He has a couple weeping areas on his toes but for the most part everything is doing better and does appear to be sealed up on his legs which is excellent news. We can continue with wrapping him at this point  as he had every time we discontinue the wraps he just breaks out with new wounds. There is really no point in is going forward with this at this point. 03/08/2020 upon evaluation today patient actually appears to be doing quite well with regard to his lower extremity ulcers. He has just a very superficial and really almost nonexistent blister on the left lower extremity he has in general done very well with the compression wraps. With  that being said I do not see any signs of infection at this time which is good news. 03/29/2020 upon evaluation today patient appears to be doing well with regard to his wounds currently except for where he had several new areas that opened up due to some of the wrap slipping and causing him trouble. He states he did not realize they had slipped. Nonetheless he has a 1 area on the right and 3 new areas on the left. Fortunately there is no signs of active infection at this time which is great news. 04/05/2020 upon evaluation today patient actually appears to be doing quite well in general in regard to his legs currently. Fortunately there is no signs of active infection at this time. No fevers, chills, nausea, vomiting, or diarrhea. He tells me next week that he will actually be seen in the lymphedema clinic on Thursday at 10 AM I see him on Wednesday next week. 04/12/2020 upon evaluation today patient appears to be doing very well with regard to his lower extremities bilaterally. In fact he does not appear to have any open wounds at this point which is good news. Fortunately there is no signs of active infection at this time. No fevers, chills, nausea, vomiting, or diarrhea. 04/19/2020 upon evaluation today patient appears to be doing well with regard to his wounds currently on the bilateral lower extremities. There does not appear to be any signs of active infection at this time. Fortunately there is no evidence of systemic infection and overall very pleased at this point. Nonetheless after I held him out last week he literally had blisters the next morning already which swelled up with him being right back here in the clinic. Overall I think that he is just not can be able to be discharged with his legs the way they are he is much to volume overloaded as far as fluid is concerned and that was discussed with him today of also discussed this but should try the clinic nurse manager as well as Dr.  Leanord Hawkingobson. 04/26/2020 upon evaluation today patient appears to be doing better with regard to his wounds currently. He is making some progress and overall swelling is under good control with the compression wraps. Fortunately there is no evidence of active infection at this time. 05/10/2020 on evaluation today patient appears to be doing overall well in regard to his lower extremities bilaterally. He is Tolerating the compression wraps without complication and with what we are seeing currently I feel like that he is making excellent progress. There is no signs of active infection at this time. 05/24/2020 upon evaluation today patient appears to be doing well in regard to his legs. The swelling is actually quite a bit down compared to where it has been in the past. Fortunately there is no sign of active infection at this time which is also good news. With that being said he does have several wounds on his toes that have opened up at this point. 05/31/2020 upon evaluation today patient appears to be doing well  with regard to his legs bilaterally where he really has no significant fluid buildup at this point overall he seems to be doing quite well. Very pleased in this regard. With regard to his toes these also seem to be drying up which is excellent. We have continue to wrap him as every time we tried as a transition to the juxta light wraps things just do not seem to get any better. 06/07/2020 upon evaluation today patient appears to be doing well with regard to his right leg at this point. Unfortunately left leg has a lot of blistering he tells me the wrap started to slide down on him when he tried to put his other Velcro wrap over top of it to help keep things in order but nonetheless still had some issues. 06/14/2020 on evaluation today patient appears to be doing well with regard to his lower extremity ulcers and foot ulcers at this point. I feel like everything is actually showing signs of improvement which  is great news overall there is no signs of active infection at this time. No fevers, chills, nausea, vomiting, or diarrhea. 06/21/2020 on evaluation today patient actually appears to be doing okay in regard to his wounds in general. With that being said the biggest issue I see is on his right foot in particular the first and second toe seem to be doing a little worse due to the fact this is staying very wet. I think he is probably getting need to change out his dressings a couple times in between each week when we see him in regard to his toes in order to keep this drier based on the location and how this is proceeding. 06/28/2020 on evaluation today patient appears to be doing a little bit more poorly overall in regard to the appearance of the skin I am actually somewhat concerned about the possibility of him having a little bit of an infection here. We discussed the course of potentially giving him a doxycycline prescription which he is taken previously with good result. With that being said I do believe that this is potentially mild and at this point easily fixed. I just do not want anything to get any worse. 07/12/2020 upon evaluation today patient actually appears to be making some progress with regard to his legs which is great news there does not appear to be any evidence of active infection. Overall very pleased with where things stand. 07/26/2020 upon evaluation today patient appears to be doing well with regard to his leg ulcers and toe ulcers at this point. He has been tolerating the compression wraps without complication overall very pleased in this regard. 08/09/2020 upon evaluation today patient appears to be doing well with regard to his lower extremities bilaterally. Fortunately there is no signs of active infection overall I am pleased with where things stand. 08/23/2020 on evaluation today patient appears to be doing well with regard to his wound. He has been tolerating the dressing  changes without complication. Fortunately there is no signs of active infection at this time. Overall his legs seem to be doing quite well which is great news and I am very pleased in that regard. No fevers, chills, nausea, vomiting, or diarrhea. 09/13/2020 upon evaluation today patient appears to be doing okay in regard to his lower extremities. He does have a fairly large blister on the right leg which I did remove the blister tissue from today so we can get this to dry out other than that however he  seems to be doing quite well. There is no signs of active infection at this time. 09/27/2020 upon evaluation today patient appears to actually be doing some better in regard to his right leg. Fortunately signs of active infection at this time which is great news. No fevers, chills, nausea, vomiting, or diarrhea. 10/04/2020 upon evaluation today patient actually appears to be showing signs of improvement which is great news with regard to his leg ulcers. Fortunately there is no signs of active infection which is great news he is still taking the antibiotics currently. No fevers, chills, nausea, vomiting, or diarrhea. 10/18/2020 on evaluation today patient appears to be doing well with regard to his legs currently. He has been tolerating the dressing changes including the wraps without complication. Fortunately there is no signs of active infection at this time. No fevers, chills, nausea, vomiting, or diarrhea. 10/25/2020 upon evaluation today patient appears to be doing decently well in regard to his wounds currently. He has been tolerating the dressing changes without complication. Overall I feel like he is making good progress albeit slow. Again this is something we can have to continue to wrap for some time to come most likely. 11/08/2020 upon evaluation today patient appears to be doing well with regard to his wounds currently. He has been tolerating the dressing changes without complication is not  currently on any antibiotics and he does not appear to show any signs of infection. He does continue to have a lot of drainage on the right leg not too severe but nonetheless this is very scattered. On the left leg this is looking to be much improved overall. 11/15/2020 upon evaluation today patient appears to be doing better with regard to his legs bilaterally. Especially the right leg which was much more significant last week. There does not appear to be any signs of active infection which is great news. No fevers, chills, nausea, vomiting, or diarrhea. 11/23/2019 upon evaluation today patient appears to be doing poorly still in regard to his lower extremities bilaterally. Unfortunately his right leg in particular appears to be doing much more poorly there is no signs really of infection this is not warm to touch but he does have a lot of drainage and weeping unfortunately. With that reason I do believe that we may need to initiate some treatment here to try to help calm down some of the swelling of the right leg. I think switching to a 4-layer compression wrap would be beneficial here. The patient is in agreement with giving this a try. 11/29/2020 upon evaluation today patient appears to be doing well currently in regard to his leg ulcers. I feel like the right leg is doing better he still has a lot of drainage but we do see some improvement here. The 4-layer compression wrap I think was helpful. 12/06/2020 upon evaluation today patient appears to be doing well with regard to his legs. In fact they seem to be doing about the best I have seen up to this point. Fortunately there is no signs of active infection at this time. No fevers, chills, nausea, vomiting, or diarrhea. 12/20/2020 upon evaluation today patient appears to be doing well at this time in regard to his legs. He is not having any significant draining which is great news. Fortunately there is no signs of active infection at this time. No fevers,  chills, nausea, vomiting, or diarrhea. 01/17/2021 upon evaluation today [place] actually appears to be doing excellent in regard to his legs. He has a  few areas again that come and go as far as his toes are concerned but overall this is doing quite well. 01/31/2021 upon evaluation today patient appears to be doing well with regard to his legs. Fortunately there does not appear to be any signs of active infection which is great news. Overall he is still having significant edema despite the compression wraps basically the 4-layer compression wrap to just keep things under control there is really not much room for play. 4/13: Mr. Adan is a longstanding patient in our clinic and benefits greatly from weekly compression wraps. Today he has no complaints. He has been tolerating the wraps well. He states he is using the lymphedema pumps at home. 5/4; patient presents for follow-up of his chronic lymphedema/venous insufficiency ulcers. He comes weekly for compression wraps. He has no complaints today. He was unable to tolerate the Coflex 2 layer Last week so we will do the four press 4-layer compression. He has been using his lymphedema pumps daily. 5/18; patient presents for 2-week follow-up. He has no complaints or issues today. He has developed a new wound to the right foot on his fourth toe. He overall feels well and denies signs of infection. 6/1; patient presents for 2-week follow-up. He has no complaints or issues today. He denies signs of infection. 04/18/2021 upon evaluation today patient appears to be doing well with regard to his legs bilaterally. Family open wound is actually on the toe of his left foot everything else is completely closed which is great news. In general I am extremely pleased with where things stand at this point. The patient is also happy that things are doing so well. Electronic Signature(s) Signed: 04/18/2021 12:54:43 PM By: Lenda Kelp PA-C Entered By: Lenda Kelp on  04/18/2021 12:54:43 -------------------------------------------------------------------------------- Physical Exam Details Patient Name: Date of Service: [name], [place] J. 04/18/2021 10:30 A M Medical Record Number: [id number] Patient Account Number: [id number] Date of Birth/Sex: Treating RN: 02/12/1951 (70 y.o. [name] Primary Care Provider: Nicoletta Ba Other Clinician: Referring Provider: Treating Provider/Extender: Adele Dan in Treatment: 273 Constitutional Well-nourished and well-hydrated in no acute distress. Respiratory normal breathing without difficulty. Psychiatric this patient is able to make decisions and demonstrates good insight into disease process. Alert and Oriented x 3. pleasant and cooperative. Notes Upon inspection patient's wound bed actually showed signs of good granulation epithelization at this point. There does not appear to be any evidence of active infection which is great news and overall very pleased with where we stand today. Electronic Signature(s) Signed: 04/18/2021 12:55:21 PM By: Lenda Kelp PA-C Entered By: Lenda Kelp on 04/18/2021 12:55:21 -------------------------------------------------------------------------------- Physician Orders Details Patient Name: Date of Service: [name], [place] J. 04/18/2021 10:30 A M Medical Record Number: [id number] Patient Account Number: [id number] Date of Birth/Sex: Treating RN: [birth year] (70 y.o. 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 - Dr. Mikey Bussing Return A Nurse Visit: - 1 week Bathing/ Shower/ Hygiene May shower with protection but do not get wound dressing(s) wet. Edema Control - Lymphedema / SCD / Other Bilateral Lower Extremities Lymphedema Pumps. Use Lymphedema pumps on leg(s) 2-3 times a day for 45-60 minutes. If wearing any wraps or hose, do not remove them. Continue exercising as instructed. Elevate legs to the level of the heart or above for 30 minutes daily and/or when sitting, a frequency of: - throughout the day Avoid standing for long periods of time. Exercise regularly Non Wound Condition Bilateral Lower Extremities Other Non Wound Condition Orders/Instructions: - lotion to both legs, 4 layer compression wraps both legs Wound Treatment Wound #193 - T Second oe Wound Laterality: Left Prim Dressing: KerraCel Ag Gelling Fiber Dressing, 2x2 in (silver alginate) Every Other Day/15 Days ary Discharge Instructions: Apply silver alginate to wound bed as instructed Secondary Dressing: Woven Gauze Sponges 2x2 in Every Other Day/15 Days Discharge Instructions: Apply over primary dressing as directed. Secured With: Insurance underwriter, Sterile 2x75 (in/in) Every Other Day/15 Days Discharge Instructions: Secure with stretch gauze as directed. Electronic Signature(s) Signed: 04/18/2021 6:34:46 PM By: Zenaida Deed RN, BSN Signed: 04/18/2021 6:55:53 PM By: Lenda Kelp PA-C Entered By: Zenaida Deed on 04/18/2021 11:28:27 -------------------------------------------------------------------------------- Problem List  Details Patient Name: Date of Service: [facility], Keontay J. 04/18/2021 10:30 A M Medical Record Number: [id number] Patient Account Number: [id number] Date of Birth/Sex: Treating RN: [birth year] (70 y.o. [name] Primary Care Provider: Nicoletta Ba Other Clinician: Referring Provider: Treating Provider/Extender: Adele Dan in Treatment: 273 Active Problems ICD-10 Encounter Code Description Active Date MDM Diagnosis L97.521 Non-pressure chronic ulcer of other 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 of 04/27/2018 No Yes skin L97.829 Non-pressure chronic ulcer of other part of left lower leg with unspecified 03/21/2021 No Yes severity L97.519 Non-pressure chronic ulcer of other part of right foot with unspecified severity 03/21/2021 No Yes I87.333 Chronic venous hypertension (idiopathic) with ulcer and inflammation of 01/22/2016 No Yes bilateral lower extremity I89.0 Lymphedema, not elsewhere classified 01/22/2016 No Yes E11.622 Type 2 diabetes mellitus with other skin ulcer 01/22/2016 No Yes E11.40 Type 2 diabetes mellitus with diabetic neuropathy, unspecified 01/22/2016 No Yes Inactive Problems ICD-10 Code Description Active Date Inactive Date L03.115 Cellulitis of right lower limb 12/22/2017 12/22/2017 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 06/30/2018 06/30/2018 L97.511 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 of skin 06/30/2018 06/30/2018 Resolved Problems ICD-10 Code Description Active Date Resolved Date L97.211 Non-pressure chronic ulcer of right calf limited to breakdown of skin 06/30/2018 06/30/2018 L97.221 Non-pressure chronic ulcer of left calf limited to breakdown of skin 09/30/2016 09/30/2016 L03.116 Cellulitis of left lower limb 04/01/2017 04/01/2017 L97.211 Non-pressure chronic ulcer of right calf limited to breakdown of skin 06/30/2017 06/30/2017 Electronic Signature(s) Signed:  04/18/2021 10:47:45 AM By: Lenda Kelp PA-C Entered By: Lenda Kelp on 04/18/2021 10:47:44 -------------------------------------------------------------------------------- Progress Note Details Patient Name: Date of Service: [name], Ava J. 04/18/2021 10:30 A M Medical Record Number: [id number] Patient Account Number: [id number] Date of Birth/Sex: Treating RN: 05-21-1951 (70 y.o. [name] Primary Care Provider: Nicoletta Ba Other Clinician: Referring Provider: Treating Provider/Extender: Adele Dan in Treatment: 273 Subjective Chief Complaint Information obtained from Patient patient is here for evaluation of venous/lymphedema ulcers History of Present Illness (HPI) Referred  by PCP for consultation. Patient has long standing history of BLE venous stasis, no prior ulcerations. At beginning of month, developed cellulitis and weeping. Received IM Rocephin followed by Keflex and resolved. Wears compression stocking, appr 6 months old. Not sure strength. No present drainage. 01/22/16 this is a patient who is a type II diabetic on insulin. 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 He tells me he religiously wears pressure stockings of uncertain strength. He was here with weeping edema about 8 months ago but did not have an open wound. Roughly a month ago he had a reopening on his bilateral legs. He is been using bandages and Neosporin. He does not complain of pain. 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 Last BUN/creatinine I have is from 11/20/15 at 13 and 1.0 respectively 01/29/16; patient arrives today having tolerated the Profore wrap. He brought in his stockings and these are 18 mmHg stockings he bought from [employer]. The compression here is likely inadequate. He does not complain of pain or excessive  drainage she has no systemic symptoms. The wound on the right looks improved as does the one on the left although one on the left is more substantial with still tissue at risk below the actual wound area on the bilateral posterior calf 02/05/16; patient arrives with poor edema control. He states that we did put a 4 layer compression on it last week. No weight appear 5 this. 02/12/16; the area on the posterior right Has healed. The left Has a substantial wound that has necrotic surface eschar that requires a debridement with a curette. 02/16/16;the patient called or a Nurse visit secondary to increased swelling. He had been in earlier in the week with his right leg healed. He was transitioned to is on pressure stocking on the right leg with the only open wound on the left, a substantial area on the left posterior calf. Note he has a history of severe lower extremity edema, he has a history of chronic atrial fibrillation but not heart failure per my notes but I'll need to research this. He is not complaining of chest pain shortness of breath or orthopnea. The intake nurse noted blisters on the previously closed right leg 02/19/16; this is the patient's regular visit day. I see him on Friday with escalating edema new wounds on the right leg and clear signs of at least right ventricular heart failure. I increased his Lasix to 40 twice a day. He is returning currently in follow-up. States he is noticed a decrease in that the edema 02/26/16 patient's legs have much less edema. There is nothing really open on the right leg. The left leg has improved condition of the large superficial wound on the posterior left leg 03/04/16; edema control is very much better. The patient's right leg wounds have healed. On the left leg he continues to have severe venous inflammation on the posterior aspect of the left leg. There is no tenderness and I don't think any of this is cellulitis. 03/11/16; patient's right leg is married  healed and he is in his own stocking. The patient's left leg has deteriorated somewhat. There is a lot of erythema around the wound on the posterior left leg. There is also a significant rim of erythema posteriorly just above where the wrap would've ended there is a new wound in this location and a lot of  tenderness. Can't rule out cellulitis in this area. 03/15/16; patient's right leg remains healed and he is in his own stocking. The patient's left leg is much better than last review. His major wound on the posterior aspect of his left Is almost fully epithelialized. He has 3 small injuries from the wraps. Really. Erythema seems a lot better on antibiotics 03/18/16; right leg remains healed and he is in his own stocking. The patient's left leg is much better. The area on the posterior aspect of the left calf is fully epithelialized. His 3 small injuries which were wrap injuries on the left are improved only one seems still open his erythema has resolved 03/25/16; patient's right leg remains healed and he is in his own stocking. There is no open area today on the left leg posterior leg is completely closed up. His wrap injuries at the superior aspect of his leg are also resolved. He looks as though he has some irritation on the dorsal ankle but this is fully epithelialized without evidence of infection. 03/28/16; we discharged this patient on Monday. Transitioned him into his own stocking. There were problems almost immediately with uncontrolled swelling weeping edema multiple some of which have opened. He does not feel systemically unwell in particular no chest pain no shortness of breath and he does not feel 04/08/16; the edema is under better control with the Profore light wrap but he still has pitting edema. There is one large wound anteriorly 2 on the medial aspect of his left leg and 3 small areas on the superior posterior calf. Drainage is not excessive he is tolerating a Profore light well 04/15/16;  put a Profore wrap on him last week. This is controlled is edema however he had a lot of pain on his left anterior foot most of his wounds are healed 04/22/16 once again the patient has denuded areas on the left anterior foot which he states are because his wrap slips up word. He saw his primary physician today is on Lasix 40 twice a day and states that he his weight is down 20 pounds over the last 3 months. 04/29/16: Much improved. left anterior foot much improved. He is now on Lasix 80 mg per day. Much improved edema control 05/06/16; I was hoping to be able to discharge him today however once again he has blisters at a low level of where the compression was placed last week mostly on his left lateral but also his left medial leg and a small area on the anterior part of the left foot. 05/09/16; apparently the patient went home after his appointment on 7/4 later in the evening developing pain in his upper medial thigh together with subjective fever and chills although his temperature was not taken. The pain was so intense he felt he would probably have to call 911. However he then remembered that he had leftover doxycycline from a previous round of antibiotics and took these. By the next morning he felt a lot better. He called and spoke to one of our nurses and I approved doxycycline over the phone thinking that this was in relation to the wounds we had previously seen although they were definitely were not. The patient feels a lot better old fever no chills he is still working. Blood sugars are reasonably controlled 05/13/16; patient is back in for review of his cellulitis on his anterior medial upper thigh. He is taking doxycycline this is a lot better. Culture I did of the nodular area on the  dorsal aspect of his foot grew MRSA this also looks a lot better. 05/20/16; the patient is cellulitis on the medial upper thigh has resolved. All of his wound areas including the left anterior foot, areas on the medial  aspect of the left calf and the lateral aspect of the calf at all resolved. He has a new blister on the left dorsal foot at the level of the fourth toe this was excised. No evidence of infection 05/27/16; patient continues to complain weeping edema. He has new blisterlike wounds on the left anterior lateral and posterior lateral calf at the top of his wrap levels. The area on his left anterior foot appears better. He is not complaining of fever, pain or pruritus in his feet. 05/30/16; the patient's blisters on his left anterior leg posterior calf all look improved. He did not increase the Lasix 100 mg as I suggested because he was going to run out of his 40 mg tablets. He is still having weeping edema of his toes 06/03/16; I renewed his Lasix at 80 mg once a day as he was about to run out when I last saw him. He is on 80 mg of Lasix now. I have asked him to cut down on the excessive amount of water he was drinking and asked him to drink according to his thirst mechanisms 06/12/2016 -- was seen 2 days ago and was supposed to wear his compression stockings at home but he is developed lymphedema and superficial blisters on the left lower extremity and hence came in for a review 06/24/16; the remaining wound is on his left anterior leg. He still has edema coming from between his toes. There is lymphedema here however his edema is generally better than when I last saw this. He has a history of atrial fibrillation but does not have a known history of congestive heart failure nevertheless I think he probably has this at least on a diastolic basis. 07/01/16 I reviewed his echocardiogram from January 2017. This was essentially normal. He did not have LVH, EF of 55-60%. His right ventricular function was normal although he did have trivial tricuspid and pulmonic regurgitation. This is not audible on exam however. I increased his Lasix to do massive edema in his legs well above his knees I think in early July. He  was also drinking an excessive amount of water at the time. 07/15/16; missed his appointment last week because of the Labor Day holiday on Monday. He could not get another appointment later in the week. Started to feel the wrap digging in superiorly so we remove the top half and the bottom half of his wrap. He has extensive erythema and blistering superiorly in the left leg. Very tender. Very swollen. Edema in his foot with leaking edema fluid. He has not been systemically unwell 07/22/16; the area on the left leg laterally required some debridement. The medial wounds look more stable. His wrap injury wounds appear to have healed. Edema and his foot is better, weeping edema is also better. He tells me he is meeting with the supplier of the external compression pumps at work 08/05/16; the patient was on vacation last week in [facility]. His wrap is been on for an extended period of time. Also over the weekend he developed an extensive area of tender erythema across his anterior medial thigh. He took to doxycycline yesterday that he had leftover from a previous prescription. The patient complains of weeping edema coming out of his toes 08/08/16; I  saw this patient on 10/2. He was tender across his anterior thigh. I put him on doxycycline. He returns today in follow-up. He does not have any open wounds on his lower leg, he still has edema weeping into his toes. 08/12/16; patient was seen back urgently today to follow-up for his extensive left thigh cellulitis/erysipelas. He comes back with a lot less swelling and erythema pain is much better. I believe I gave him Augmentin and Cipro. His wrap was cut down as he stated a roll down his legs. He developed blistering above the level of the wrap that remained. He has 2 open blisters and 1 intact. 08/19/16; patient is been doing his primary doctor who is increased his Lasix from 40-80 once a day or 80 already has less edema. Cellulitis has remained improved in  the left thigh. 2 open areas on the posterior left calf 08/26/16; he returns today having new open blisters on the anterior part of his left leg. He has his compression pumps but is not yet been shown how to use some vital representative from the supplier. 09/02/16 patient returns today with no open wounds on the left leg. Some maceration in his plantar toes 09/10/2016 -- Dr. Leanord Hawking had recently discharged him on 09/02/2016 and he has come right back with redness swelling and some open ulcers on his left lower extremity. He says this was caused by trying to apply his compression stockings and he's been unable to use this and has not been able to use his lymphedema pumps. He had some doxycycline leftover and he has started on this a few days ago. 09/16/16; there are no open wounds on his leg on the left and no evidence of cellulitis. 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 He does not complain of symptoms here. I am not clear use using his external compression pumps. 09/23/16; I have not seen this patient in 2 weeks. He canceled his appointment 10 days ago as he was going on vacation. He tells me that on Monday he noticed a large area on his posterior left leg which is been draining copiously and is reopened into a large wound. He is been using ABDs and the external part of his juxtalite, according to our nurse this was not on properly. 10/07/16; Still a substantial area on the posterior left leg. Using silver alginate 10/14/16; in general better although there is still open area which looks healthy. Still using silver alginate. He reminds me that this happen before he left for [facility] T oday while he was showering in the morning. He had been using his juxtalite's 10/21/16; the area on his posterior left leg is fully epithelialized. However he arrives today with a large area of tender erythema in his medial and posterior left thigh just  above the knee. I have marked the area. Once again he is reluctant to consider hospitalization. I treated him with oral antibiotics in the past for a similar situation with resolution I think with doxycycline however this area it seems more extensive to me. He is not complaining of fever but does have chills and says states he is thirsty. His blood sugar today was in the 140s at home 10/25/16 the area on his posterior left leg is fully epithelialized although there is still some weeping edema. The large area of tenderness and erythema in his medial and posterior left thigh is a lot less tender although there is still a lot  of swelling in this thigh. He states he feels a lot better. He is on doxycycline and Augmentin that I started last week. This will continued until Tuesday, December 26. I have ordered a duplex ultrasound of the left thigh rule out DVT whether there is an abscess something that would need to be drained I would also like to know. 11/01/16; he still has weeping edema from a not fully epithelialized area on his left posterior calf. Most of the rest of this looks a lot better. He has completed his antibiotics. His thigh is a lot better. Duplex ultrasound did not show a DVT in the thigh 11/08/16; he comes in today with more Denuded surface epithelium from the posterior aspect of his calf. There is no real evidence of cellulitis. The superior aspect of his wrap appears to have put quite an indentation in his leg just below the knee and this may have contributed. He does not complain of pain or fever. We have been using silver alginate as the primary dressing. The area of cellulitis in the right thigh has totally resolved. He has been using his compression stockings once a week 11/15/16; the patient arrives today with more loss of epithelium from the posterior aspect of his left calf. He now has a fairly substantial wound in this area. The reason behind this deterioration isn't exactly clear  although his edema is not well controlled. He states he feels he is generally more swollen systemically. He is not complaining of chest pain shortness of breath fever. T me he has an appointment with his primary physician in early February. He is on 80 mg of oral ells Lasix a day. He claims compliance with the external compression pumps. He is not having any pain in his legs similar to what he has with his recurrent cellulitis 11/22/16; the patient arrives a follow-up of his large area on his left lateral calf. This looks somewhat better today. He came in earlier in the week for a dressing change since I saw him a week ago. He is not complaining of any pain no shortness of breath no chest pain 11/28/16; the patient arrives for follow-up of his large area on the left lateral calf this does not look better. In fact it is larger weeping edema. The surface of the wound does not look too bad. We have been using silver alginate although I'm not certain that this is a dressing issue. 12/05/16; again the patient follows up for a large wound on the left lateral and left posterior calf this does not look better. There continues to be weeping edema necrotic surface tissue. More worrisome than this once again there is erythema below the wound involving the distal Achilles and heel suggestive of cellulitis. He is on his feet working most of the day of this is not going well. We are changing his dressing twice a week to facilitate the drainage. 12/12/16; not much change in the overall dimensions of the large area on the left posterior calf. This is very inflamed looking. I gave him an. Doxycycline last week does not really seem to have helped. He found the wrap very painful indeed it seems to of dog into his legs superiorly and perhaps around the heel. He came in early today because the drainage had soaked through his dressings. 12/19/16- patient arrives for follow-up evaluation of his left lower extremity ulcers. He  states that he is using his lymphedema pumps once daily when there is "no drainage". He admits to  not using his lipedema pumps while under current treatment. His blood sugars have been consistently between 150-200. 12/26/16; the patient is not using his compression pumps at home because of the wetness on his feet. I've advised him that I think it's important for him to use this daily. He finds his feet too wet, he can put a plastic bag over his legs while he is in the pumps. Otherwise I think will be in a vicious circle. We are using silver alginate to the major area on his left posterior calf 01/02/17; the patient's posterior left leg has further of all into 3 open wounds. All of them covered with a necrotic surface. He claims to be using his compression pumps once a day. His edema control is marginal. Continue with silver alginate 01/10/17; the patient's left posterior leg actually looks somewhat better. There is less edema, less erythema. Still has 3 open areas covered with a necrotic surface requiring debridement. He claims to be using his compression pumps once a day his edema control is better 01/17/17; the patient's left posterior calf look better last week when I saw him and his wrap was changed 2 days ago. He has noted increasing pain in the left heel and arrives today with much larger wounds extensive erythema extending down into the entire heel area especially tender medially. He is not systemically unwell CBGs have been controlled no fever. Our intake nurse showed me limegreen drainage on his AVD pads. 01/24/17; his usual this patient responds nicely to antibiotics last week giving him Levaquin for presumed Pseudomonas. The whole entire posterior part of his leg is much better much less inflamed and in the case of his Achilles heel area much less tender. He has also had some epithelialization posteriorly there are still open areas here and still draining but overall considerably better 01/31/17-  He has continue to tolerate the compression wraps. he states that he continues to use the lymphedema pumps daily, and can increase to twice daily on the weekends. He is voicing no complaints or concerns regarding his LLE ulcers 02/07/17-he is here for follow-up evaluation. He states that he noted some erythema to the left medial and anterior thigh, which he states is new as of yesterday. He is concerned about recurrent cellulitis. He states his blood sugars have been slightly elevated, this morning in the 180s 02/14/17; he is here for follow-up evaluation. When he was last here there was erythema superiorly from his posterior wound in his anterior thigh. He was prescribed Levaquin however a culture of the wound surface grew MRSA over the phone I changed him to doxycycline on Monday and things seem to be a lot better. 02/24/17; patient missed his appointment on Friday therefore we changed his nurse visit into a physician visit today. Still using silver alginate on the large area of the posterior left thigh. He isn't new area on the dorsal left second toe 03/03/17; actually better today although he admits he has not used his external compression pumps in the last 2 days or so because of work responsibilities over the weekend. 03/10/17; continued improvement. External compression pumps once a day almost all of his wounds have closed on the posterior left calf. Better edema control 03/17/17; in general improved. He still has 3 small open areas on the lateral aspect of his left leg however most of the area on the posterior part of his leg is epithelialized. He has better edema control. He has an ABD pad under his stocking on the  right anterior lower leg although he did not let us look at that today. 03/24/17; patient arrives back in clinic today with no open areas however there are areas on the posterior left calf and anterior left calf that are less than 100% epithelialized. His edema is well controlled in the  left lower leg. There is some pitting edema probably lymphedema in the left upper thigh. He uses compression pumps at home once per day. I tried to get him to do this twice a day although he is very reticent. 04/01/2017 -- for the last 2 days he's had significant redness, tenderness and weeping and came in for an urgent visit today. 04/07/17; patient still has 6 more days of doxycycline. He was seen by Dr. Meyer Russel last Wednesday for cellulitis involving the posterior aspect, lateral aspect of his Involving his heel. For the most part he is better there is less erythema and less weeping. He has been on his feet for 12 hours o2 over the weekend. Using his compression pumps once a day 04/14/17 arrives today with continued improvement. Only one area on the posterior left calf that is not fully epithelialized. He has intense bilateral venous inflammation associated with his chronic venous insufficiency disease and secondary lymphedema. We have been using silver alginate to the left posterior calf wound In passing he tells [place] today that the right leg but we have not seen in quite some time has an open area on it but he doesn't want [place] to look at this today states he will show this to [place] next week. 04/21/17; there is no open area on his left leg although he still reports some weeping edema. He showed [place] his right leg today which is the first time we've seen this leg in a long time. He has a large area of open wound on the right leg anteriorly healthy granulation. Quite a bit of swelling in the right leg and some degree of venous inflammation. He told us about the right leg in passing last week but states that deterioration in the right leg really only happened over the weekend 04/28/17; there is no open area on the left leg although there is an irritated part on the posterior which is like a wrap injury. The wound on the right leg which was new from last week at least to [place] is a lot better. 05/05/17; still no open  area on the left leg. Patient is using his new compression stocking which seems to be doing a good job of controlling the edema. He states he is using his compression pumps once per day. The right leg still has an open wound although it is better in terms of surface area. Required debridement. A lot of pain in the posterior right Achilles marked tenderness. Usually this type of presentation this patient gives concern for an active cellulitis 05/12/17; patient arrives today with his major wound from last week on the right lateral leg somewhat better. Still requiring debridement. He was using his compression stocking on the left leg however that is reopened with superficial wounds anteriorly he did not have an open wound on this leg previously. He is still using his juxta light's once daily at night. He cannot find the time to do this in the morning as he has to be at work by 7 AM 05/19/17; right lateral leg wound looks improved. No debridement required. The concerning area is on the left posterior leg which appears to almost have a subcutaneous hemorrhagic component to  it. Melina Fiddler been using silver alginate to all the wounds 05/26/17; the right lateral leg wound continues to look improved. However the area on the left posterior calf is a tightly adherent surface. [place] using silver alginate. Because of the weeping edema in his legs there is very little good alternatives. 06/02/17; the patient left here last week looking quite good. Major wound on the left posterior calf and a small one on the right lateral calf. Both of these look satisfactory. He tells me that by Wednesday he had noted increased pain in the left leg and drainage. He called on Thursday and Friday to get an appointment here but we were blocked. He did not go to urgent care or his primary physician. He thinks he had a fever on Thursday but did not actually take his temperature. He has not been using his compression pumps on the left leg  because of pain. I advised him to go to the emergency room today for IV antibiotics for stents of left leg cellulitis but he has refused I have asked him to take 2 days off work to keep his leg elevated and he has refused this as well. In view of this I'm going to call him and Augmentin and doxycycline. 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 However after a detailed discussion regarding the risk benefits and alternatives he refuses to get admitted to the hospital. With no other recourse I will continue him on oral antibiotics as before and hopefully he'll have his infectious disease consultation this week. 06/16/2017 -- the patient was seen today by the nurse practitioner at infectious disease Ms. Dixon. 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 He has also been advise Lotrimin cream locally. she also had wise IV antibiotics for his condition if it flares up 06/23/17; patient arrives today with drainage bilaterally although the remaining wound on the left posterior calf after cleaning up today "highlighter yellow drainage" did not look too bad. Unfortunately he has had breakdown on the right anterior leg [previously this leg had not been open and he is using a black stocking] he went to see infectious disease and is been put on clindamycin 150 mg daily, I did not verify the dose although I'm not familiar with using clindamycin in this dosing range, perhaps for prophylaxisoo 06/27/17; I brought this patient back today to follow-up on the wound deterioration on the right lower leg  together with surrounding cellulitis. I started him on doxycycline 4 days ago. This area looks better however he comes in today with intense cellulitis on the medial part of his left thigh. This is not have a wound in this area. Extremely tender. We've been using silver alginate to the wounds on the right lower leg left lower leg with bilateral 4 layer compression he is using his external compression pumps once a day 07/04/17; patient's left medial thigh cellulitis looks better. He has not been using his compression pumps as his insert said it was contraindicated with cellulitis. His right leg continues to make improvements all the wounds are still  open. We only have one remaining wound on the left posterior calf. Using silver alginate to all open areas. He is on doxycycline which I started a week ago and should be finishing I gave him Augmentin after Thursday's visit for the severe cellulitis on the left medial thigh which fortunately looks better 07/14/17; the patient's left medial thigh cellulitis has resolved. The cellulitis in his right lower calf on the right also looks better. All of his wounds are stable to improved we've been using silver alginate he has completed the antibiotics I have given him. He has clindamycin 150 mg once a day prescribed by infectious disease for prophylaxis, I've advised him to start this now. We have been using bilateral Unna boots over silver alginate to the wound areas 07/21/17; the patient is been to see infectious disease who noted his recurrent problems with cellulitis. He was not able to tolerate prophylactic clindamycin therefore he is on amoxicillin 500 twice a day. He also had a second daily dose of Lasix added By Dr. Oneta Rack but he is not taking this. Nor is he being completely compliant with his compression pumps a especially not this week. He has 2 remaining wounds one on the right posterior lateral lower leg and one on the left posterior medial lower  leg. 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 The patient has Unna boots bilaterally. 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 He has bilateral Unna boots for compression and silver alginate to his wounds. Arrives today with his legs looking as good as I have seen him in quite some time. Not surprisingly his wounds look better as well with improvement on the right lateral leg venous insufficiency wound and also the left medial leg. He is still using the compression pumps once a day 08/11/17; both legs appear to be doing better wounds on the right lateral and left medial legs look better. Skin on the right leg quite good. He is been using silver alginate as the primary dressing. I'm going to use Anasept gel calcium alginate and maintain all the secondary dressings 08/18/17; the patient continues to actually do quite well. The area on his right lateral leg is just about closed the left medial also looks better although it is still moist in this area. His edema is well controlled we have been using Anasept gel with calcium alginate and the usual secondary dressings, 4 layer compression and once daily use of his compression pumps "always been able to manage 09/01/17; the patient continues to do reasonably well in spite of his trip to [place]. The area on the right lateral leg is epithelialized. Left is much better but still open. He has more edema and more chronic erythema on the left leg [venous inflammation] 09/08/17; he arrives today with no open wound on the right lateral leg and decently controlled edema. Unfortunately his left leg is not nearly as in his good situation as last week.he apparently had increasing edema starting on Saturday. He edema soaked through into his  foot so used a plastic bag to walk around his home. The area on the medial right leg which was his open area is about the same however he has lost surface epithelium on the left lateral which is new and he has significant pain in  the Achilles area of the left foot. He is already on amoxicillin chronically for prophylaxis of cellulitis in the left leg 09/15/17; he is completed a week of doxycycline and the cellulitis in the left posterior leg and Achilles area is as usual improved. He still has a lot of edema and fluid soaking through his dressings. There is no open wound on the right leg. He saw infectious disease NP today 09/22/17;As usual 1 we transition him from our compression wraps to his stockings things did not go well. He has several small open areas on the right leg. He states this was caused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 usual he has very angry upper area of edema just below the right medial knee and on the right lateral calf. He has no open area on his feet. The patient is traveling to [facility] next week. I will send him in an antibiotic. We will continue to wrap the right leg. We ordered extremitease stockings for him last week and I plan to transition the right leg to a stocking when he gets home  which will be in 10 days time. As usual he is very reluctant to take his pumps with him when he travels 09/07/2018; patient returns from [facility]. He shows me a picture of his left leg in the mid part of his trip last week with intense fire engine erythema. The picture look bad enough I would have considered sending him to the hospital. Instead he went to the wound care center in [facility]. They did not prescribe him antibiotics but he did take some doxycycline he had leftover from a previous visit. I had given him trimethoprim sulfamethoxazole before he left this did not work according to the patient. This is resulted in some improvement fortunately. He comes back with a large wound on the left posterior calf. Smaller area on the left anterior tibia. Denuded blisters on the dorsal left foot over his toes. Does not have much in the way of wounds on the right leg although he does have a very tender area on the right posterior area just below the popliteal fossa also suggestive of infection. He promises me he is back on his pumps twice a day 09/15/2018; the intense cellulitis in his left lower calf is a lot better. The wound area on the posterior left calf is also so better. However he has reasonably extensive wounds on the dorsal aspect of his second and third toes and the proximal foot just at the base of the toes. There is nothing open on the right leg 09/22/2018; the patient has excellent edema control in his legs bilaterally. He is using his external compression pumps twice a day. He has no open area on the right leg and only the areas in the left foot dorsally second and third toe area on the left side. He does not have any signs of active cellulitis. 10/06/2018; the patient has good edema control bilaterally. He has no open wound on the right leg. There is a blister in the posterior aspect of his left calf that we had to deal with today. He is using his compression  pumps twice a  day. There is no signs of active cellulitis. We have been using silver alginate to the wound areas. He still has vulnerable areas on the base of his left first second toes dorsally He has a his extremities stockings and we are going to transition him today into the stocking on the right leg. He is cautioned that he will need to continue to use the compression pumps twice a day. If he notices uncontrolled edema in the right leg he may need to go to 3 times a day. 10/13/2018; the patient came in for a nurse check on Friday he has a large flaccid blister on the right medial calf just below the knee. We unroofed this. He has this and a new area underneath the posterior mid calf which was undoubtedly a blister as well. He also has several small areas on the right which is the area we put his extremities stocking on. 10/19/2018; the patient went to see infectious disease this morning I am not sure if that was a routine follow-up in any case the doxycycline I had given him was discontinued and started on linezolid. He has not started this. It is easy to look at his left calf and the inflammation and think this is cellulitis however he is very tender in the tissue just below the popliteal fossa and I have no doubt that there is infection going on here. He states the problem he is having is that with the compression pumps the edema goes down and then starts walking the wrap falls down. We will see if we can adhere this. He has 1 or 2 minuscule open areas on the right still areas that are weeping on the posterior left calf, the base of his left second and third toes 10/26/18; back today in clinic with quite of skin breakdown in his left anterior leg. This may have been infection the area below the popliteal fossa seems a lot better however tremendous epithelial loss on the left anterior mid tibia area over quite inexpensive tissue. He has 2 blisters on the right side but no other open wound here. 10/29/2018;  came in urgently to see [place] today and we worked him in for review. He states that the 4 layer compression on the right leg caused pain he had to cut it down to roughly his mid calf this caused swelling above the wrap and he has blisters and skin breakdown today. As a result of the pain he has not been using his pumps. Both legs are a lot more edematous and there is a lot of weeping fluid. 11/02/18; arrives in clinic with continued difficulties in the right leg> left. Leg is swollen and painful. multiple skin blisters and new open areas especially laterally. He has not been using his pumps on the right leg. 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 He is not systemically unwell. 11/09/2018; the patient claims he is being compliant with his pumps. He is finished the doxycycline I gave him last week. Culture I did of the wound on the right lateral leg showed a few very resistant methicillin staph aureus. This was resistant to doxycycline. Nevertheless he states the pain in the leg is a lot better which makes me wonder if the cultured organism was not really what was causing the problem nevertheless this is a very dangerous organism to be culturing out of any wound. His right leg is still a lot larger than the  left. He is using an Radio broadcast assistant on this area, he blames a 4-layer compression for causing the original skin breakdown which I doubt is true however I cannot talk him out of it. We have been using silver alginate to all of these areas which were initially blisters 11/16/2018; patient is being compliant with his external compression pumps at twice a day. Miraculously he arrives in clinic today with absolutely no open wounds. He has better edema control on the left where he has been using 4 layer compression versus wound of wounds on the right and I pointed this out to him. There is no inflammation in the skin in his lower legs which is also somewhat unusual for  him. There is no open wounds on the dorsal left foot. He has extremitease stockings at home and I have asked him to bring these in next week. 11/25/18 patient's lower extremity on examination today on the left appears for the most part to be wound free. He does have an open wound on the lateral aspect of the right lower extremity but this is minimal compared to what I've seen in past. He does request that we go ahead and wrap the left leg as well even though there's nothing open just so hopefully it will not reopen in short order. 1/28; patient has superficial open wounds on the right lateral calf left anterior calf and left posterior calf. His edema control is adequate. 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 We have been using silver alginate as the primary dressing. He claims compliance with his compression pumps 2/4; patient has superficial open wounds on numerous areas of his left calf and again one on the left dorsal foot. The areas on the right lateral calf have healed. The cellulitis that I gave him doxycycline for last week is also resolved this was mostly on the left anterior calf just below the tibial tuberosity. His edema looks fairly well-controlled. He tells me he went to see his primary doctor today and had blood work ordered 2/11; once again he has several open areas on the left calf left tibial area. Most of these are small and appear to have healthy granulation. He does not have anything open on the right. The edema and control in his thighs is pretty good which is usually a good indication he has been using his pumps as requested. 2/18; he continues to have several small areas on the left calf and left tibial area. Most of these are small healthy granulation. We put him in his stocking on the right leg last week and he arrives with a superficial open area over the right upper tibia and a fairly  large area on the right lateral tibia in similar condition. His edema control actually does not look too bad, he claims to be using his compression pumps twice a day 2/25. Continued small areas on the left calf and left tibial area. New areas especially on the right are identified just below the tibial tuberosity and on the right upper tibia itself. There are also areas of weeping edema fluid even without an obvious wound. He does not have a considerable degree of lymphedema but clearly there is more edema here than his skin can handle. He states he is using the pumps twice a day. We have an Unna boot on the right and 4 layer compression on the left. 3/3; he continues to have  an area on the right lateral calf and right posterior calf just below the popliteal fossa. There is a fair amount of tenderness around the wound on the popliteal fossa but I did not see any evidence of cellulitis, could just be that the wrap came down and rubbed in this area. ooHe does not have an open area on the left leg however there is an area on the left dorsal foot at the base of the third toe ooWe have been using silver alginate to all wound areas 3/10; he did not have an open area on his left leg last time he was here a week ago. T oday he arrives with a horizontal wound just below the tibial tuberosity and an area on the left lateral calf. He has intense erythema and tenderness in this area. The area is on the right lateral calf and right posterior calf better than last week. We have been using silver alginate as usual 3/18 - Patient returns with 3 small open areas on left calf, and 1 small open area on right calf, the skin looks ok with no significant erythema, he continues the UNA boot on right and 4 layer compression on left. The right lateral calf wound is closed , the right posterior is small area. we will continue silver alginate to the areas. 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 He is been tolerating the dressing changes without complication. Fortunately she has made excellent improvement in regard to the overall status of his wounds. Unfortunately every time we cease wrapping him he ends up reopening in causing more significant issues at that point. Again I'm unsure of the best direction to take although I think the lymphedema clinic may be appropriate for him. 02/03/19 on evaluation today patient appears to be doing well in regard to the wounds that we saw him for last week unfortunately he has a new area on the proximal portion of his right medial/posterior lower extremity where the wrap somewhat slowed down and caused swelling and a blister to rub and open. Unfortunately this is the only opening that he has on either leg at this point. 02/17/19 on evaluation today patient's bilateral lower extremities appear to be doing well. He still completely healed in regard to the left lower extremity. In regard to the right lower extremity the area where the wrap and slid down and caused the blister still seems to be slightly open although this is dramatically better than during the last evaluation two weeks ago. I'm very pleased with the way this stands overall. 03/03/19 on evaluation today patient appears to be doing well in regard to his right lower extremity in general although he did have a new blister open this does not appear to be showing any evidence of active infection at this time. Fortunately there's No fevers, chills, nausea, or vomiting noted at this time. Overall I feel like he is making good progress it does feel like that the right leg will we perform the [employer] seems to do with a bit better than three layer wrap on the left which slid down on him. We may switch to doing bilateral in the book  wraps. 5/4; I have not seen Mr. Mikita in quite some time. According to our case manager he did not have an open wound  on his left leg last week. He had 1 remaining wound on the right posterior medial calf. He arrives today with multiple openings on the left leg probably were blisters and/or wrap injuries from Unna boots. I do not think the Unna boot's will provide adequate compression on the left. I am also not clear about the frequency he is using the compression pumps. 03/17/19 on evaluation today patient appears to be doing excellent in regard to his lower extremities compared to last week's evaluation apparently. He had gotten significantly worse last week which is unfortunate. The [employer] wrap on the left did not seem to do very well for him at all and in fact it didn't control his swelling significantly enough he had an additional outbreak. Subsequently we go back to the four layer compression wrap on the left. This is good news. At least in that he is doing better and the wound seem to be killing him. He still has not heard anything from the lymphedema clinic. 03/24/19 on evaluation today patient actually appears to be doing much better in regard to his bilateral lower Trinity as compared to last week when I saw him. Fortunately there's no signs of active infection at this time. He has been tolerating the dressing changes without complication. Overall I'm extremely pleased with the progress and appearance in general. 04/07/19 on evaluation today patient appears to be doing well in regard to his bilateral lower extremities. His swelling is significantly down from where it was previous. With that being said he does have a couple blisters still open at this point but fortunately nothing that seems to be too severe and again the majority of the larger openings has healed at this time. 04/14/19 on evaluation today patient actually appears to be doing quite well in regard to his bilateral lower  extremities in fact I'm not even sure there's anything significantly open at this time at any site. Nonetheless he did have some trouble with these wraps where they are somewhat irritating him secondary to the fact that he has noted that the graph wasn't too close down to the end of this foot in a little bit short as well up to his knee. Otherwise things seem to be doing quite well. 04/21/19 upon evaluation today patient's wound bed actually showed evidence of being completely healed in regard to both lower extremities which is excellent news. There does not appear to be any signs of active infection which is also good news. I'm very pleased in this regard. No fevers, chills, nausea, or vomiting noted at this time. 04/28/19 on evaluation today patient appears to be doing a little bit worse in regard to both lower extremities on the left mainly due to the fact that when he went infection disease the wrap was not wrapped quite high enough he developed a blister above this. On the right he is a small open area of nothing too significant but again this is continuing to give him some trouble he has been were in the Velcro compression that he has at home. 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 He's been tolerating the dressing changes without complication. Fortunately there's no signs of active infection at this time. No fevers, chills, nausea, or vomiting noted at this time. We have been trying to get an appointment with her lymphedema clinic in [facility] but unfortunately nobody can get them on phone with not been able to even fax  information over the patient likewise is not been able to get in touch with them. Overall I'm not sure exactly what's going on here with to reach out again today. 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 Still  having a lot of drainage unfortunately. He tells me especially in the left but even on the right. There's no signs of active infection which is good news we've been using so ratcheted up to this point. 05/19/19 on evaluation today patient actually appears to be doing quite well with regard to his left lower extremity which is great news. Fortunately in regard to the right lower extremity has an issues with his wrap and he subsequently did remove this from what I'm understanding. Nonetheless long story short is what he had rewrapped once he removed it subsequently had maggots underneath this wrap whenever he came in for evaluation today. With that being said they were obviously completely cleaned away by the nursing staff. The visit today which is excellent news. However he does appear to potentially have some infection around the right ankle region where the maggots were located as well. He will likely require anabiotic therapy today. 05/26/19 on evaluation today patient actually appears to be doing much better in regard to his bilateral lower extremities. I feel like the infection is under much better control. With that being said there were maggots noted when the wrap was removed yet again today. Again this could have potentially been left over from previous although at this time there does not appear to be any signs of significant drainage there was obviously on the wrap some drainage as well this contracted gnats or otherwise. Either way I do not see anything that appears to be doing worse in my pinion and in fact I think his drainage has slowed down quite significantly likely mainly due to the fact to his infection being under better control. 06/02/2019 on evaluation today patient actually appears to be doing well with regard to his bilateral lower extremities there is no signs of active infection at this time which is great news. With that being said he does have several open areas more so on the  right than the left but nonetheless these are all significantly better than previously noted. 06/09/2019 on evaluation today patient actually appears to be doing well. His wrap stayed up and he did not cause any problems he had more drainage on the right compared to the left but overall I do not see any major issues at this time which is great news. 06/16/2019 on evaluation today patient appears to be doing excellent with regard to his lower extremities the only area that is open is a new blister that can have opened as of today on the medial ankle on the left. Other than this he really seems to be doing great I see no major issues at this point. 06/23/2019 on evaluation today patient appears to be doing quite well with regard to his bilateral lower extremities. In fact he actually appears to be almost completely healed there is a small area of weeping noted of the right lower extremity just above the ankle. Nonetheless fortunately there is no signs of active infection at this time which is good news. No fevers, chills, nausea, vomiting, or diarrhea. 8/24; the patient arrived for a nurse visit today but complained of very significant pain in the left leg and therefore I was asked to look  at this. Noted that he did not have an open area on the left leg last week nevertheless this was wrapped. The patient states that he is not been able to put his compression pumps on the left leg because of the discomfort. He has not been systemically unwell 06/30/2019 on evaluation today patient unfortunately despite being excellent last week is doing much worse with regard to his left lower extremity today. In fact he had to come in for a nurse on Monday where his left leg had to be rewrapped due to excessive weeping Dr. Leanord Hawking placed him on doxycycline at that point. Fortunately there is no signs of active infection Systemically at this time which is good news. 07/07/2019 in regard to the patient's wounds today he actually  seems to be doing well with his right lower extremity there really is nothing open or draining at this point this is great news. Unfortunately the left lower extremity is given him additional trouble at this time. There does not appear to be any signs of active infection nonetheless he does have a lot of edema and swelling noted at this point as well as blistering all of which has led to a much more poor appearing leg at this time compared to where it was 2 weeks ago when it was almost completely healed. Obviously this is a little discouraging for the patient. He is try to contact the lymphedema clinic in [place] he has not been able to get through to them. 07/14/2019 on evaluation today patient actually appears to be doing slightly better with regard to his left lower extremity ulcers. Overall I do feel like at least at the top of the wrap that we have been placing this area has healed quite nicely and looks much better. The remainder of the leg is showing signs of improvement. Unfortunately in the thigh area he still has an open region on the left and again on the right he has been utilizing just a Band-Aid on an area that also opened on the thigh. Again this is an area that were not able to wrap although we did do an Ace wrap to provide some compression that something that obviously is a little less effective than the compression wraps we have been using on the lower portion of the leg. He does have an appointment with the lymphedema clinic in [place] on Friday. 07/21/2019 on evaluation today patient appears to be doing better with regard to his lower extremity ulcers. He has been tolerating the dressing changes without complication. Fortunately there is no signs of active infection at this time. No fevers, chills, nausea, vomiting, or diarrhea. I did receive the paperwork from the physical therapist at the lymphedema clinic in [place]. Subsequently I signed off on that this morning and  sent that back to him for further progression with the treatment plan. 07/28/2019 on evaluation today patient appears to be doing very well with regard to his right lower extremity where I do not see any open wounds at this point. Fortunately he is feeling great as far as that is concerned as well. In regard to the left lower extremity he has been having issues with still several areas of weeping and edema although the upper leg is doing better his lower leg still I think is going require the compression wrap at this time. No fevers, chills, nausea, vomiting, or diarrhea. 08/04/2019 on evaluation today patient unfortunately is having new wounds on the right lower extremity. Again we have been using  Unna boot wrap on that side. We switched him to using his juxta lite wrap at home. With that being said he tells me he has been using it although his legs extremely swollen and to be honest really does not appear that he has been. I cannot know that for sure however. Nonetheless he has multiple new wounds on the right lower extremity at this time. Obviously we will have to see about getting this rewrapped for him today. 08/11/2019 on evaluation today patient appears to be doing fairly well with regard to his wounds. He has been tolerating the dressing changes including the compression wraps without complication. He still has a lot of edema in his upper thigh regions bilaterally he is supposed to be seeing the lymphedema clinic on the 15th of this month once his wraps arrive for the upper part of his legs. 08/18/2019 on evaluation today patient appears to be doing well with regard to his bilateral lower extremities at this point. He has been tolerating the dressing changes without complication. Fortunately there is no signs of active infection which is also good news. He does have a couple weeping areas on the first and second toe of the right foot he also has just a small area on the left foot upper leg and a  small area on the left lower leg but overall he is doing quite well in my opinion. He is supposed to be getting his wraps shortly in fact tomorrow and then subsequently is seeing the lymphedema clinic next Wednesday on the 21st. Of note he is also leaving on the 25th to go on vacation for a week to the beach. For that reason and since there is some uncertainty about what there can be doing at lymphedema clinic next Wednesday I am get a make an appointment for next Friday here for [place] to see what we need to do for him prior to him leaving for vacation. 10/23; patient arrives in considerable pain predominantly in the upper posterior calf just distal to the popliteal fossa also in the wound anteriorly above the major wound. This is probably cellulitis and he has had this recurrently in the past. He has no open wound on the right side and he has had an Radio broadcast assistant in that area. Finally I note that he has an area on the left posterior calf which by enlarge is mostly epithelialized. This protrudes beyond the borders of the surrounding skin in the setting of dry scaly skin and lymphedema. The patient is leaving for [facility] on Sunday. Per his longstanding pattern, he will not take his compression pumps with him predominantly out of fear that they will be stolen. He therefore asked that we put a Unna boot back on the right leg. He will also contact the wound care center in [facility] to see if they can change his dressing in the mid week. 11/3; patient returned from his vacation to [facility]. He was seen on 1 occasion at their wound care center. They did a 2 layer compression system as they did not have our 4-layer wrap. I am not completely certain what they put on the wounds. They did not change the Unna boot on the right. The patient is also seeing a lymphedema specialist physical therapist in [place]. It appears that he has some compression sleeve for his thighs which indeed look quite a bit  better than I am used to seeing. He pumps over these with his external compression pumps. 11/10; the  patient has a new wound on the right medial thigh otherwise there is no open areas on the right. He has an area on the left leg posteriorly anteriorly and medially and an area over the left second toe. We have been using silver alginate. He thinks the injury on his thigh is secondary to friction from the compression sleeve he has. 11/17; the patient has a new wound on the right medial thigh last week. He thinks this is because he did not have a underlying stocking for his thigh juxta lite apparatus. He now has this. The area is fairly large and somewhat angry but I do not think he has underlying cellulitis. ooHe has a intact blister on the right anterior tibial area. ooSmall wound on the right great toe dorsally ooSmall area on the medial left calf. 11/30; the patient does not have any open areas on his right leg and we did not take his juxta lite stocking off. However he states that on Friday his compression wrap fell down lodging around his upper mid calf area. As usual this creates a lot of problems for him. He called urgently today to be seen for a nurse visit however the nurse visit turned into a provider visit because of extreme erythema and pain in the left anterior tibia extending laterally and posteriorly. The area that is problematic is extensive 10/06/2019 upon evaluation today patient actually appears to be doing poorly in regard to his left lower extremity. He Dr. Leanord Hawking did place him on doxycycline this past Monday apparently due to the fact that he was doing much worse in regard to this left leg. Fortunately the doxycycline does seem to be helping. Unfortunately we are still having a very difficult time getting his edema under any type of control in order to anticipate discharge at some point. The only way were really able to control his lymphedema really is with compression wraps and  that has only even seemingly temporary. He has been seeing a lymphedema clinic they are trying to help in this regard but still this has been somewhat frustrating in general for the patient. 10/13/19 on evaluation today patient appears to be doing excellent with regard to his right lower extremity as far as the wounds are concerned. His swelling is still quite extensive unfortunately. He is still having a lot of drainage from the thigh areas bilaterally which is unfortunate. He's been going to lymphedema clinic but again he still really does not have this edema under control as far as his lower extremities are concern. With regard to his left lower extremity this seems to be improving and I do believe the doxycycline has been of benefit for him. He is about to complete the doxycycline. 10/20/2019 on evaluation today patient appears to be doing poorly in regard to his bilateral lower extremities. More in the right thigh he has a lot of irritation at this site unfortunately. In regard to the left lower extremity the wrap was not quite as high it appears and does seem to have caused him some trouble as well. Fortunately there is no evidence of systemic infection though he does have some blue-green drainage which has me concerned for the possibility of Pseudomonas. He tells me he is previously taking Cipro without complications and he really does not care for Levaquin however due to some of the side effects he has. He is not allergic to any medications specifically antibiotics that were aware of. 10/27/2019 on evaluation today patient actually does appear to  be for the most part doing better when compared to last week's evaluation. With that being said he still has multiple open wounds over the bilateral lower extremities. He actually forgot to start taking the Cipro and states that he still has the whole bottle. He does have several new blisters on left lower extremity today I think I would recommend he go  ahead and take the Cipro based on what I am seeing at this point. 12/30-Patient comes at 1 week visit, 4 layer compression wraps on the left and Unna boot on the right, primary dressing Xtrasorb and silver alginate. Patient is taking his Cipro and has a few more days left probably 5-6, and the legs are doing better. He states he is using his compressions devices which I believe he has 11/10/2019 on evaluation today patient actually appears to be much better than last time I saw him 2 weeks ago. His wounds are significantly improved and overall I am very pleased in this regard. Fortunately there is no signs of active infection at this time. He is just a couple of days away from completing Cipro. Overall his edema is much better he has been using his lymphedema pumps which I think is also helping at this point. 11/17/2019 on evaluation today patient appears to be doing excellent in regard to his wounds in general. His legs are swollen but not nearly as much as they have been in the past. Fortunately he is tolerating the compression wraps without complication. No fevers, chills, nausea, vomiting, or diarrhea. He does have some erythema however in the distal portion of his right lower extremity specifically around the forefoot and toes there is a little bit of warmth here as well. 11/24/2019 on evaluation today patient appears to be doing well with regard to his right lower extremity I really do not see any open wounds at this point. His left lower extremity does have several open areas and his right medial thigh also is open. Other than this however overall the patient seems to be making good progress and I am very pleased at this point. 12/01/2019 on evaluation today patient appears to be doing poorly at this point in regard to his left lower extremity has several new blisters despite the fact that we have him in compression wraps. In fact he had a 4-layer compression wrap, his upper thigh wrapped from  lymphedema clinic, and a juxta light over top of the 4 layer compression wrap the lymphedema clinic applied and despite all this he still develop blisters underneath. Obviously this does have me concerned about the fact that unfortunately despite what we are doing to try to get wounds healed he continues to have new areas arise I do not think he is ever good to be at the point where he can realistically just use wraps at home to keep things under control. Typically when we heal him it takes about 1-2 days before he is back in the clinic with severe breakdown and blistering of his lower extremities bilaterally. This is happened numerous times in the past. Unfortunately I think that we may need some help as far as overall fluid overload to kind of limit what we are seeing and get things under better control. 12/08/2019 on evaluation today patient presents for follow-up concerning his ongoing bilateral lower extremity edema. Unfortunately he is still having quite a bit of swelling the compression wraps are controlling this to some degree but he did see Dr. [name] his cardiologist I do have  that available for review today as far as the appointment was concerned that was on 12/06/2019. Obviously that she has been 2 days ago. The patient states that he is only been taking the Lasix 80 mg 1 time a day he had told me previously he was taking this twice a day. Nonetheless Dr. [name] recommended this be up to 80 mg 2 times a day for the patient as he did appear to be fluid overloaded. With that being said the patient states he did this yesterday and he was unable to go anywhere or do anything due to the fact that he was constantly having to urinate. Nonetheless I think that this is still good to be something that is important for him as far as trying to get his edema under control at all things that he is going to be able to just expect his wounds to get under control and things to be better without going through at  least a period of time where he is trying to stabilize his fluid management in general and I think increasing the Lasix is likely the first step here. It was also mentioned the possibility that the patient may require metolazone. With that being said he wanted to have the patient take Lasix twice a day first and then reevaluating 2 months to see where things stand. 12/15/2019 upon evaluation today patient appears to be doing regard to his legs although his toes are showing some signs of weeping especially on the left at this point to some degree on the right. There does not appear to be any signs of active infection and overall I do feel like the compression wraps are doing well for him but he has not been able to take the Lasix at home and the increased dose that Dr. [name] recommended. He tells me that just not go to be feasible for him. Nonetheless I think in this case he should probably send a message to Dr. [name] in order to discuss options from the standpoint of possible admission to get the fluid off or otherwise going forward. 12/22/2019 upon evaluation today patient appears to be doing fairly well with regard to his lower extremities at this point. In fact he would be doing excellent if it was not for the fact that his right anterior thigh apparently had an allergic reaction to adhesive tape that he used. The wound itself that we have been monitoring actually appears to be healed. There is a lot of irritation at this point. 12/29/2019 upon evaluation today patient appears to be doing well in regard to his lower extremities. His left medial thigh is open and somewhat draining today but this is the only region that is open the right has done much better with the treatment utilizing the steroid cream that I prescribed for him last week. Overall I am pleased in that regard. Fortunately there is no signs of active infection at this time. No fevers, chills, nausea, vomiting, or diarrhea. 01/05/2020 upon  evaluation today patient appears to be doing more poorly in regard to his right lower extremity at this point upon evaluation today. Unfortunately he continues to have issues in this regard and I think the biggest issue is controlling his edema. This obviously is not very well controlled at this point is been recommended that he use the Lasix twice a day but he has not been able to do that. Unfortunately I think this is leading to an issue where honestly he is not really able  to effectively control his edema and therefore the wounds really are not doing significantly better. I do not think that he is going to be able to keep things under good control unless he is able to control his edema much better. I discussed this again in great detail with him today. 01/12/2020 good news is patient actually appears to be doing quite well today at this point. He does have an appointment with lymphedema clinic tomorrow. His legs appear healed and the toe on the left is almost completely healed. In general I am very pleased with how things stand at this point. 01/19/2020 upon evaluation today patient appears to actually be doing well in regard to his lower extremities there is nothing open at this point. Fortunately he has done extremely well more recently. Has been seeing lymphedema clinic as well. With that being said he has Velcro wraps for his lower legs as well as his upper legs. The only wound really is on his toe which is the right great toe and this is barely anything even there. With all that being said I think it is good to be appropriate today to go ahead and switch him over to the Velcro compression wraps. 01/26/2020 upon evaluation today patient appears to be doing worse with regard to his lower extremities after last week switch him to Velcro compression wraps. Unfortunately he lasted less than 24 hours he did not have the sock portion of his Velcro wrap on the left leg and subsequently developed a blister  underneath the Velcro portion. Obviously this is not good and not what we were looking for at this point. He states the lymphedema clinic did tell him to wear the wrap for 23 hours and take him off for 1 I am okay with that plan but again right now we got a get things back under control again he may have some cellulitis noted as well. 02/02/2020 upon evaluation today patient unfortunately appears to have several areas of blistering on his bilateral lower extremities today mainly on the feet. His legs do seem to be doing somewhat better which is good news. Fortunately there is no evidence of active infection at this time. No fevers, chills, nausea, vomiting, or diarrhea. 02/16/2020 upon evaluation today patient appears to be doing well at this time with regard to his legs. He has a couple weeping areas on his toes but for the most part everything is doing better and does appear to be sealed up on his legs which is excellent news. We can continue with wrapping him at this point as he had every time we discontinue the wraps he just breaks out with new wounds. There is really no point in is going forward with this at this point. 03/08/2020 upon evaluation today patient actually appears to be doing quite well with regard to his lower extremity ulcers. He has just a very superficial and really almost nonexistent blister on the left lower extremity he has in general done very well with the compression wraps. With that being said I do not see any signs of infection at this time which is good news. 03/29/2020 upon evaluation today patient appears to be doing well with regard to his wounds currently except for where he had several new areas that opened up due to some of the wrap slipping and causing him trouble. He states he did not realize they had slipped. Nonetheless he has a 1 area on the right and 3 new areas on the left. Fortunately  there is no signs of active infection at this time which is great  news. 04/05/2020 upon evaluation today patient actually appears to be doing quite well in general in regard to his legs currently. Fortunately there is no signs of active infection at this time. No fevers, chills, nausea, vomiting, or diarrhea. He tells me next week that he will actually be seen in the lymphedema clinic on Thursday at 10 AM I see him on Wednesday next week. 04/12/2020 upon evaluation today patient appears to be doing very well with regard to his lower extremities bilaterally. In fact he does not appear to have any open wounds at this point which is good news. Fortunately there is no signs of active infection at this time. No fevers, chills, nausea, vomiting, or diarrhea. 04/19/2020 upon evaluation today patient appears to be doing well with regard to his wounds currently on the bilateral lower extremities. There does not appear to be any signs of active infection at this time. Fortunately there is no evidence of systemic infection and overall very pleased at this point. Nonetheless after I held him out last week he literally had blisters the next morning already which swelled up with him being right back here in the clinic. Overall I think that he is just not can be able to be discharged with his legs the way they are he is much to volume overloaded as far as fluid is concerned and that was discussed with him today of also discussed this but should try the clinic nurse manager as well as Dr. Leanord Hawking. 04/26/2020 upon evaluation today patient appears to be doing better with regard to his wounds currently. He is making some progress and overall swelling is under good control with the compression wraps. Fortunately there is no evidence of active infection at this time. 05/10/2020 on evaluation today patient appears to be doing overall well in regard to his lower extremities bilaterally. He is Tolerating the compression wraps without complication and with what we are seeing currently I feel like  that he is making excellent progress. There is no signs of active infection at this time. 05/24/2020 upon evaluation today patient appears to be doing well in regard to his legs. The swelling is actually quite a bit down compared to where it has been in the past. Fortunately there is no sign of active infection at this time which is also good news. With that being said he does have several wounds on his toes that have opened up at this point. 05/31/2020 upon evaluation today patient appears to be doing well with regard to his legs bilaterally where he really has no significant fluid buildup at this point overall he seems to be doing quite well. Very pleased in this regard. With regard to his toes these also seem to be drying up which is excellent. We have continue to wrap him as every time we tried as a transition to the juxta light wraps things just do not seem to get any better. 06/07/2020 upon evaluation today patient appears to be doing well with regard to his right leg at this point. Unfortunately left leg has a lot of blistering he tells me the wrap started to slide down on him when he tried to put his other Velcro wrap over top of it to help keep things in order but nonetheless still had some issues. 06/14/2020 on evaluation today patient appears to be doing well with regard to his lower extremity ulcers and foot ulcers at this point.  I feel like everything is actually showing signs of improvement which is great news overall there is no signs of active infection at this time. No fevers, chills, nausea, vomiting, or diarrhea. 06/21/2020 on evaluation today patient actually appears to be doing okay in regard to his wounds in general. With that being said the biggest issue I see is on his right foot in particular the first and second toe seem to be doing a little worse due to the fact this is staying very wet. I think he is probably getting need to change out his dressings a couple times in between each  week when we see him in regard to his toes in order to keep this drier based on the location and how this is proceeding. 06/28/2020 on evaluation today patient appears to be doing a little bit more poorly overall in regard to the appearance of the skin I am actually somewhat concerned about the possibility of him having a little bit of an infection here. We discussed the course of potentially giving him a doxycycline prescription which he is taken previously with good result. With that being said I do believe that this is potentially mild and at this point easily fixed. I just do not want anything to get any worse. 07/12/2020 upon evaluation today patient actually appears to be making some progress with regard to his legs which is great news there does not appear to be any evidence of active infection. Overall very pleased with where things stand. 07/26/2020 upon evaluation today patient appears to be doing well with regard to his leg ulcers and toe ulcers at this point. He has been tolerating the compression wraps without complication overall very pleased in this regard. 08/09/2020 upon evaluation today patient appears to be doing well with regard to his lower extremities bilaterally. Fortunately there is no signs of active infection overall I am pleased with where things stand. 08/23/2020 on evaluation today patient appears to be doing well with regard to his wound. He has been tolerating the dressing changes without complication. Fortunately there is no signs of active infection at this time. Overall his legs seem to be doing quite well which is great news and I am very pleased in that regard. No fevers, chills, nausea, vomiting, or diarrhea. 09/13/2020 upon evaluation today patient appears to be doing okay in regard to his lower extremities. He does have a fairly large blister on the right leg which I did remove the blister tissue from today so we can get this to dry out other than that however he  seems to be doing quite well. There is no signs of active infection at this time. 09/27/2020 upon evaluation today patient appears to actually be doing some better in regard to his right leg. Fortunately signs of active infection at this time which is great news. No fevers, chills, nausea, vomiting, or diarrhea. 10/04/2020 upon evaluation today patient actually appears to be showing signs of improvement which is great news with regard to his leg ulcers. Fortunately there is no signs of active infection which is great news he is still taking the antibiotics currently. No fevers, chills, nausea, vomiting, or diarrhea. 10/18/2020 on evaluation today patient appears to be doing well with regard to his legs currently. He has been tolerating the dressing changes including the wraps without complication. Fortunately there is no signs of active infection at this time. No fevers, chills, nausea, vomiting, or diarrhea. 10/25/2020 upon evaluation today patient appears to be doing decently well  in regard to his wounds currently. He has been tolerating the dressing changes without complication. Overall I feel like he is making good progress albeit slow. Again this is something we can have to continue to wrap for some time to come most likely. 11/08/2020 upon evaluation today patient appears to be doing well with regard to his wounds currently. He has been tolerating the dressing changes without complication is not currently on any antibiotics and he does not appear to show any signs of infection. He does continue to have a lot of drainage on the right leg not too severe but nonetheless this is very scattered. On the left leg this is looking to be much improved overall. 11/15/2020 upon evaluation today patient appears to be doing better with regard to his legs bilaterally. Especially the right leg which was much more significant last week. There does not appear to be any signs of active infection which is great news.  No fevers, chills, nausea, vomiting, or diarrhea. 11/23/2019 upon evaluation today patient appears to be doing poorly still in regard to his lower extremities bilaterally. Unfortunately his right leg in particular appears to be doing much more poorly there is no signs really of infection this is not warm to touch but he does have a lot of drainage and weeping unfortunately. With that reason I do believe that we may need to initiate some treatment here to try to help calm down some of the swelling of the right leg. I think switching to a 4-layer compression wrap would be beneficial here. The patient is in agreement with giving this a try. 11/29/2020 upon evaluation today patient appears to be doing well currently in regard to his leg ulcers. I feel like the right leg is doing better he still has a lot of drainage but we do see some improvement here. The 4-layer compression wrap I think was helpful. 12/06/2020 upon evaluation today patient appears to be doing well with regard to his legs. In fact they seem to be doing about the best I have seen up to this point. Fortunately there is no signs of active infection at this time. No fevers, chills, nausea, vomiting, or diarrhea. 12/20/2020 upon evaluation today patient appears to be doing well at this time in regard to his legs. He is not having any significant draining which is great news. Fortunately there is no signs of active infection at this time. No fevers, chills, nausea, vomiting, or diarrhea. 01/17/2021 upon evaluation today Kolbee actually appears to be doing excellent in regard to his legs. He has a few areas again that come and go as far as his toes are concerned but overall this is doing quite well. 01/31/2021 upon evaluation today patient appears to be doing well with regard to his legs. Fortunately there does not appear to be any signs of active infection which is great news. Overall he is still having significant edema despite the compression wraps  basically the 4-layer compression wrap to just keep things under control there is really not much room for play. 4/13: Mr. Haueter is a longstanding patient in our clinic and benefits greatly from weekly compression wraps. Today he has no complaints. He has been tolerating the wraps well. He states he is using the lymphedema pumps at home. 5/4; patient presents for follow-up of his chronic lymphedema/venous insufficiency ulcers. He comes weekly for compression wraps. He has no complaints today. He was unable to tolerate the Coflex 2 layer Last week so we will do the  four press 4-layer compression. He has been using his lymphedema pumps daily. 5/18; patient presents for 2-week follow-up. He has no complaints or issues today. He has developed a new wound to the right foot on his fourth toe. He overall feels well and denies signs of infection. 6/1; patient presents for 2-week follow-up. He has no complaints or issues today. He denies signs of infection. 04/18/2021 upon evaluation today patient appears to be doing well with regard to his legs bilaterally. Family open wound is actually on the toe of his left foot everything else is completely closed which is great news. In general I am extremely pleased with where things stand at this point. The patient is also happy that things are doing so well. Objective Constitutional Well-nourished and well-hydrated in no acute distress. Vitals Time Taken: 10:43 AM, Height: 70 in, Weight: 380.2 lbs, BMI: 54.5, Temperature: 98.1 F, Pulse: 70 bpm, Respiratory Rate: 20 breaths/min, Blood Pressure: 147/86 mmHg. Respiratory normal breathing without difficulty. Psychiatric this patient is able to make decisions and demonstrates good insight into disease process. Alert and Oriented x 3. pleasant and cooperative. General Notes: Upon inspection patient's wound bed actually showed signs of good granulation epithelization at this point. There does not appear to be  any evidence of active infection which is great news and overall very pleased with where we stand today. Integumentary (Hair, Skin) Wound #193 status is Open. Original cause of wound was Gradually Appeared. The date acquired was: 01/31/2021. The wound has been in treatment 11 weeks. The wound is located on the Left T Second. The wound measures 1cm length x 0.8cm width x 0.1cm depth; 0.628cm^2 area and 0.063cm^3 volume. There is oe no tunneling or undermining noted. There is a medium amount of serosanguineous drainage noted. The wound margin is distinct with the outline attached to the wound base. There is large (67-100%) pink, pale granulation within the wound bed. There is a small (1-33%) amount of necrotic tissue within the wound bed including Adherent Slough. Wound #196 status is Healed - Epithelialized. Original cause of wound was Gradually Appeared. The date acquired was: 03/21/2021. The wound has been in treatment 4 weeks. The wound is located on the Right T Fourth. The wound measures 0cm length x 0cm width x 0cm depth; 0cm^2 area and 0cm^3 volume. 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 Post procedure Diagnosis Wound #: Same as Pre-Procedure There was a Four Layer Compression Therapy Procedure by Fonnie Mu, RN. Post procedure Diagnosis Wound #: Same as Pre-Procedure Plan Follow-up Appointments: Return  Appointment in 2 weeks. - Dr. Mikey Bussing Nurse Visit: - 1 week Bathing/ Shower/ Hygiene: May shower with protection but do not get wound dressing(s) wet. Edema Control - Lymphedema / SCD / Other: Lymphedema Pumps. Use Lymphedema pumps on leg(s) 2-3 times a day for 45-60 minutes. If wearing any wraps or hose, do not remove them. Continue exercising as instructed. Elevate legs to the level of the heart or above for 30 minutes daily and/or when sitting, a frequency of: -  throughout the day Avoid standing for long periods of time. Exercise regularly Non Wound Condition: Other Non Wound Condition Orders/Instructions: - lotion to both legs, 4 layer compression wraps both legs WOUND #193: - T Second Wound Laterality: Left oe Prim Dressing: KerraCel Ag Gelling Fiber Dressing, 2x2 in (silver alginate) Every Other Day/15 Days ary Discharge Instructions: Apply silver alginate to wound bed as instructed Secondary Dressing: Woven Gauze Sponges 2x2 in Every Other Day/15 Days Discharge Instructions: Apply over primary dressing as directed. Secured With: Insurance underwriter, Sterile 2x75 (in/in) Every Other Day/15 Days Discharge Instructions: Secure with stretch gauze as directed. 1. Would recommend him going continue with the wound care measures as before and the patient is in agreement with plan. We are using a 4-layer compression wrap bilaterally which I think is still appropriate. 2. I am also can recommend that we have the patient continue with the silver cell dressing over any open wound areas I think that is doing a great job. We will see patient back for reevaluation in 1 week here in the clinic. If anything worsens or changes patient will contact our office for additional recommendations. This will be a nurse visit and I will subsequently see him in 2 weeks for a provider follow-up. Electronic Signature(s) Signed: 04/18/2021 12:55:55 PM By: Lenda Kelp PA-C Entered By: Lenda Kelp on 04/18/2021 12:55:54 -------------------------------------------------------------------------------- SuperBill Details Patient Name: Date of Service: [name], Vaughn J. 04/18/2021 Medical Record Number: [id number] Patient Account Number: [id number] Date of Birth/Sex: Treating RN: 1951-06-02 (70 y.o. 

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
## 2021-04-20 ENCOUNTER — Encounter: Payer: Self-pay | Admitting: Family Medicine

## 2021-04-20 ENCOUNTER — Other Ambulatory Visit: Payer: Self-pay

## 2021-04-20 ENCOUNTER — Ambulatory Visit (INDEPENDENT_AMBULATORY_CARE_PROVIDER_SITE_OTHER): Payer: Medicare Other | Admitting: Family Medicine

## 2021-04-20 VITALS — BP 149/67 | HR 63 | Temp 98.1°F | Resp 16 | Ht 69.0 in | Wt 372.8 lb

## 2021-04-20 DIAGNOSIS — N1832 Chronic kidney disease, stage 3b: Secondary | ICD-10-CM | POA: Diagnosis not present

## 2021-04-20 DIAGNOSIS — I10 Essential (primary) hypertension: Secondary | ICD-10-CM

## 2021-04-20 LAB — BASIC METABOLIC PANEL
BUN: 26 mg/dL — ABNORMAL HIGH (ref 6–23)
CO2: 30 mEq/L (ref 19–32)
Calcium: 8.9 mg/dL (ref 8.4–10.5)
Chloride: 100 mEq/L (ref 96–112)
Creatinine, Ser: 1.39 mg/dL (ref 0.40–1.50)
GFR: 51.53 mL/min — ABNORMAL LOW (ref >60.00)
Glucose, Bld: 164 mg/dL — ABNORMAL HIGH (ref 70–99)
Potassium: 4.5 mEq/L (ref 3.5–5.1)
Sodium: 140 mEq/L (ref 135–145)

## 2021-04-20 MED ORDER — HYDRALAZINE HCL 10 MG PO TABS: ORAL_TABLET | ORAL | 1 refills | Status: DC

## 2021-04-20 NOTE — Progress Notes (Signed)
OFFICE VISIT  04/20/2021  CC:  Chief Complaint  Patient presents with   Follow-up    hypertension   HPI:    Patient is a 70 y.o. Caucasian male who presents for f/u HTN. I last saw him on 04/04/21. A/P as of that visit: "1) DM 2: sounds like control is pretty good as usual. Hba1c and urine microalb/cr today.   2) HTN: bp not well controlled lately but pt states his stress is contributing so much that he wants to hold off on any changes in med mgmt at this time. If elevated bp's persist in the absence of increased stress level then we'd likely cautiously increase his lisinopril to 30mg  qd as next step. Lytes/cr today.   3) CRI III: avoids NSAIDs. Sounds like he can liberalize fluid intake a little.   4) chronic a-fib: asymptomatic.  Cont extended release diltiazem 240mg  qd and lopressor 75mg  bid, plus stroke prophylaxis with xarelto 20mg  qd. CBC + lytes/cr today.   5) OSA, high risk for/question of: discussed past w/u in 2020 that was very uncomfortable for him and resulted in sleep study that no conclusions could be drawn from.  He refused repeat sleep study and doesn't want to pursue re-eval at this time (if he did then we'd refer to [place] pulm/sleep med).   6) Chronic bilat LE lymphedema with recurrent stasis ulcers and cellulitis-->cont wound care clinic, cont monthly pen G prophylaxis here.   7) Health maintenance exam: Reviewed age and gender appropriate health maintenance issues (prudent diet, regular exercise, health risks of tobacco and excessive alcohol, use of seatbelts, fire alarms in home, use of sunscreen).  Also reviewed age and gender appropriate health screening as well as vaccine recommendations. Vaccines: Tdap due->rx sent to [facility] today.  Shingrix discussed->pt declined b/c of cost.  Otherwise ALL UTD. Labs: cbc, cmet, flp, a1c, urine microalb/cr. Prostate ca screening: annual PSA due after 07/2021. Colon ca screening: his cologuard was positive  02/2021-->referred pt to GI->pt had a mix-up and this has not been arranged-->new referral ordered today."  INTERIM HX: Creatinine at last visit was up a little to 1.57.  Increased fluids intake recommended but no med changes made at that time. Rpt on 04/16/21 showed sCr improved to 1.49.  His bp's at home were consistently elevated so I recommenced he increase his lisinopril to a WHOLE 40mg  tab qd.  Cardiology f/u 2 days ago->pt had bradycardia into 40s, was asymptomatic, unclear what dose of metoprolol was actually taking but last instructions were to take 75mg  bid.  Cardiology made sure lopressor dose was decreased to 1/2 of 50mg  tab bid.  Home bp's with wrist cuff still 180s avg syst, 80s diast. This is opposed to "near normal" bp recalled by pt at recent wound care center check and 124/50 at at cardiol 2 d/a (when he was still on higher dose of lopressor, though). He denies dizziness, HA, CP, SOB, or vision changes.     Past Medical History:  Diagnosis Date   ALLERGIC RHINITIS 08/11/2006   ASTHMA 08/11/2006   Chronic atrial fibrillation (HCC) 08/2014   Chronic combined systolic and diastolic CHF (congestive heart failure) [facility])    Cardiology f/u 12/2017: pt volume overloaded (R heart dysf suspected), BNP very high, lasix increased.  Repeat echo 12/2017: normal LV EF, mild DD, +RV syst dysfxn, mod pulm HTN, biatrial enlgmt.   Chronic constipation    Chronic renal insufficiency, stage 2 (mild)    Borderline stage III (GFR 60s).   Colon  cancer screening 02/2021   02/2021 Cologuard POS->GI ref   DIABETES MELLITUS, TYPE II 08/11/2006   DM W/RENAL MANIFESTATIONS, TYPE II 04/20/2007   HYPERTENSION 08/11/2006   Impaired mobility and endurance    MRSA infection 06/2018   LL venous stasis ulcer infected   Normocytic anemia 2016-2019   03/2018 B12 normal, iron ok (ferritin borderline low).  Plan repeat CBC at f/u summer 2019 and if decreased from baseline will check hemoccults and iron labs again.    OBESITY, MORBID 12/14/2007   saxenda started 05/2020 by [facility] wt mgmt center   OSA (obstructive sleep apnea)    to get sleep study with [facility]) as of 12/03/2018 consult.   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  [id number] ID started him on amoxil prophylaxis---ineffective.  End 2018/Jan 2019 penicillin G injections prophyl helpful but pt declined to continue this as of 01/2018 ID f/u.  ID talked him into resuming monthly penicillin G as of 02/2018 f/u.     Restless leg syndrome    Rx'd clonazepam 09/2017 and pt refused to take it after reading the medication's potential side effects.   Venous stasis ulcers of both lower extremities (HCC)    Severe lymphedema.  wound clinic care ongoing as of 01/2018    Past Surgical History:  Procedure Laterality Date   Carotid dopplers  07/23/2018   Left NORMAL.  Right 1-39% ICA stenosis, with <50% distal CCA stenosis (not hemodynamically significant)   TRANSTHORACIC ECHOCARDIOGRAM  11/2007; 09/2014; 11/2015;12/2017   LV fxn normal, EF normal, mild dilation of left atrium.  2015 grade II diast dysfxn.  2017 EF 55-60%. 2019: LVEF 60-65%, mild RV syst dysf,biatrial enlgmt, mod pulm htn.   URETERAL STENT PLACEMENT     virtual colonoscopy  01/2011   Normal    Outpatient Medications Prior to Visit  Medication Sig Dispense Refill   arginine 500 MG tablet Take by mouth.     b complex vitamins capsule Take 1 capsule by mouth every morning.     butalbital-acetaminophen-caffeine (FIORICET WITH CODEINE) 50-325-40-30 MG capsule TAKE 1 TO 2 CAPSULES BY  MOUTH EVERY 6 HOURS AS  NEEDED FOR HEADACHE(S)  MANUFACTURER RECOMMENDS NOT EXCEEDING 6 CAPSULES/DAY 60 capsule 0   cetirizine (ZYRTEC) 10 MG tablet Take 10 mg by mouth daily.     CHROMIUM GTF PO Take by mouth daily.     Coenzyme Q10 (CO Q 10) 10 MG CAPS Take by mouth. Reported on 02/19/2016     diltiazem (TIAZAC) 240 MG 24 hr capsule  TAKE 1 CAPSULE BY MOUTH  DAILY 90 capsule 1   furosemide (LASIX) 40 MG tablet TAKE 2 TABLETS BY MOUTH TWO TIMES DAILY (Patient taking differently: Take 80 mg in the mornings and 40 mg in the evenings) 360 tablet 3   Glutamine 500 MG CAPS Take by mouth.     HYDROcodone-acetaminophen (NORCO/VICODIN) 5-325 MG tablet 1-2 tabs po bid prn pain 60 tablet 0   Insulin Lispro Prot & Lispro (HUMALOG MIX 75/25 KWIKPEN) (75-25) 100 UNIT/ML Kwikpen INJECT SUBCUTANEOUSLY 25  UNITS EVERY MORNING AND 20  UNITS EVERY EVENING AT  SUPPER 45 mL 3   Insulin Pen Needle (B-D ULTRAFINE III SHORT PEN) 31G X 8 MM MISC USE 1 PENNEEDLE TWICE DAILY 200 each 1   lisinopril (ZESTRIL) 40 MG tablet Take 1 tablet (40 mg total) by mouth daily. 90 tablet 3   metFORMIN (GLUCOPHAGE) 1000 MG tablet TAKE 1  TABLET BY MOUTH  TWICE DAILY WITH MEALS 180 tablet 1   metoprolol tartrate (LOPRESSOR) 50 MG tablet Take 0.5 tablets (25 mg total) by mouth 2 (two) times daily. 90 tablet 3   Multiple Vitamin (MULTIVITAMIN) tablet Take 1 tablet by mouth daily.     ONETOUCH ULTRA test strip CHECK BLOOD SUGAR TWICE  DAILY 200 strip 3   rivaroxaban (XARELTO) 20 MG TABS tablet TAKE 1 TABLET BY MOUTH ONCE DAILY WITH SUPPER 90 tablet 3   terazosin (HYTRIN) 10 MG capsule TAKE 1 CAPSULE BY MOUTH  ONCE DAILY AT BEDTIME 90 capsule 3   vitamin E 400 UNIT capsule Take 400 Units by mouth every morning. Selenium 50mg      Facility-Administered Medications Prior to Visit  Medication Dose Route Frequency Provider Last Rate Last Admin   penicillin g benzathine (BICILLIN LA) 1200000 UNIT/2ML injection 600,000 Units  600,000 Units Intramuscular Q30 days , MD   600,000 Units at 04/12/20 0933   penicillin g benzathine (BICILLIN LA) 1200000 UNIT/2ML injection 600,000 Units  600,000 Units Intramuscular Q30 days 06/12/20, MD   600,000 Units at 04/04/21 0814   penicillin g benzathine (BICILLIN LA) 1200000 UNIT/2ML injection 600,000 Units  600,000 Units  Intramuscular Q30 days 06/04/21, MD   600,000 Units at 04/04/21 0813   penicillin g benzathine (BICILLIN LA) 1200000 UNIT/2ML injection 600,000 Units  600,000 Units Intramuscular Q30 days 06/04/21, MD   600,000 Units at 02/28/21 0857    Allergies  Allergen Reactions   Other Other (See Comments)    Sneezing, coughing    ROS As per HPI  PE: Vitals with BMI 04/20/2021 04/18/2021 04/04/2021  Height 5\' 9"  5\' 9"  5' 8.5"  Weight 372 lbs 13 oz 375 lbs 383 lbs 3 oz  BMI 55.03 55.35 57.41  Systolic 149 124 06/04/2021  Diastolic 67 50 65  Pulse 63 42 63   Gen: Alert, well appearing.  Patient is oriented to person, place, time, and situation. AFFECT: pleasant, lucid thought and speech. CV: RRR (rate 60) no m/r/g.   LUNGS: CTA bilat, nonlabored resps, good aeration in all lung fields. He has unna boots on both LLs  LABS:  Lab Results  Component Value Date   TSH 4.40 06/08/2018   Lab Results  Component Value Date   WBC 8.1 04/04/2021   HGB 13.4 04/04/2021   HCT 43.6 04/04/2021   MCV 84.8 04/04/2021   PLT 236 04/04/2021   Lab Results  Component Value Date   CREATININE 1.49 04/16/2021   BUN 24 (H) 04/16/2021   NA 139 04/16/2021   K 4.4 04/16/2021   CL 100 04/16/2021   CO2 28 04/16/2021   Lab Results  Component Value Date   ALT 11 04/04/2021   AST 15 04/04/2021   ALKPHOS 70 07/12/2020   BILITOT 1.0 04/04/2021   Lab Results  Component Value Date   CHOL 101 04/04/2021   Lab Results  Component Value Date   HDL 41 04/04/2021   Lab Results  Component Value Date   LDLCALC 46 04/04/2021   Lab Results  Component Value Date   TRIG 61 04/04/2021   Lab Results  Component Value Date   CHOLHDL 2.5 04/04/2021   Lab Results  Component Value Date   PSA 0.35 07/12/2020   PSA 0.71 07/06/2019   PSA 0.26 03/09/2018   Lab Results  Component Value Date   HGBA1C 7.5 (H) 04/04/2021   IMPRESSION AND PLAN:  Uncontrolled HTN  in the setting of CRI stage  IIIa. Recent increase in lisinopril to 40mg  has not helped bp much. Bradycardia precludes any increase in his lopressor (continue 1/2 of 50mg  tab bid).He is already on alpha blocker (hytrin 10mg  qd) and CCB (diltiazam XR 240 qd).   I hesitant to use clonidine b/c of potential for making his bradycardia worse. Have to avoid thiazide d/t pt being on high dose lasix. Will go ahead and add hydralazine 10mg , 1 tid.  Discussed small chance of possible inc in LE swelling from this med. Cont home monitoring->he has a wrist cuff and sounds like prospects for getting an upper arm cuff are not good. Monitor sCr and lytes today.  An After Visit Summary was printed and given to the patient.  FOLLOW UP: Return in about 1 week (around 04/27/2021).  Signed:  Santiago BumpersPhil Caralina Nop, MD           04/20/2021

## 2021-04-21 ENCOUNTER — Other Ambulatory Visit: Payer: Self-pay | Admitting: Family Medicine

## 2021-04-25 ENCOUNTER — Encounter (HOSPITAL_BASED_OUTPATIENT_CLINIC_OR_DEPARTMENT_OTHER): Payer: Medicare Other | Admitting: Physician Assistant

## 2021-04-25 ENCOUNTER — Other Ambulatory Visit: Payer: Self-pay

## 2021-04-25 DIAGNOSIS — Z794 Long term (current) use of insulin: Secondary | ICD-10-CM | POA: Diagnosis not present

## 2021-04-25 DIAGNOSIS — E11621 Type 2 diabetes mellitus with foot ulcer: Secondary | ICD-10-CM | POA: Diagnosis not present

## 2021-04-25 DIAGNOSIS — E11622 Type 2 diabetes mellitus with other skin ulcer: Secondary | ICD-10-CM | POA: Diagnosis not present

## 2021-04-25 DIAGNOSIS — E1140 Type 2 diabetes mellitus with diabetic neuropathy, unspecified: Secondary | ICD-10-CM | POA: Diagnosis not present

## 2021-04-25 DIAGNOSIS — I87333 Chronic venous hypertension (idiopathic) with ulcer and inflammation of bilateral lower extremity: Secondary | ICD-10-CM | POA: Diagnosis not present

## 2021-04-25 DIAGNOSIS — Z9119 Patient's noncompliance with other medical treatment and regimen: Secondary | ICD-10-CM | POA: Diagnosis not present

## 2021-04-25 DIAGNOSIS — L97829 Non-pressure chronic ulcer of other part of left lower leg with unspecified severity: Secondary | ICD-10-CM | POA: Diagnosis not present

## 2021-04-25 DIAGNOSIS — L97521 Non-pressure chronic ulcer of other 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 of skin: Secondary | ICD-10-CM | POA: Diagnosis not present

## 2021-04-25 DIAGNOSIS — I890 Lymphedema, not elsewhere classified: Secondary | ICD-10-CM | POA: Diagnosis not present

## 2021-04-25 DIAGNOSIS — I272 Pulmonary hypertension, unspecified: Secondary | ICD-10-CM | POA: Diagnosis not present

## 2021-04-25 DIAGNOSIS — L97519 Non-pressure chronic ulcer of other part of right foot with unspecified severity: Secondary | ICD-10-CM | POA: Diagnosis not present

## 2021-04-25 DIAGNOSIS — I872 Venous insufficiency (chronic) (peripheral): Secondary | ICD-10-CM | POA: Diagnosis not present

## 2021-04-25 DIAGNOSIS — E1151 Type 2 diabetes mellitus with diabetic peripheral angiopathy without gangrene: Secondary | ICD-10-CM | POA: Diagnosis not present

## 2021-04-25 DIAGNOSIS — I482 Chronic atrial fibrillation, unspecified: Secondary | ICD-10-CM | POA: Diagnosis not present

## 2021-04-25 NOTE — Progress Notes (Signed)
MAN, EFFERTZ ([id number]) Visit Report for 04/25/2021 SuperBill Details Patient Name: Date of Service: CO JO, CERONE 04/25/2021 Medical Record Number: [id number] Patient Account Number: [id number] Date of Birth/Sex: Treating RN: 20-May-1951 (70 y.o. [name], [name] Primary Care Provider: Nicoletta Ba Other Clinician: Referring Provider: Treating Provider/Extender: Adele Dan in Treatment: 274 Diagnosis Coding ICD-10 Codes Code Description 6577020144 Non-pressure chronic ulcer of other 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 of skin L97.829 Non-pressure chronic ulcer of other part of left lower leg with unspecified severity L97.519 Non-pressure chronic ulcer of other part of right foot with unspecified severity I87.333 Chronic venous hypertension (idiopathic) with ulcer and inflammation of bilateral lower extremity I89.0 Lymphedema, not elsewhere classified E11.622 Type 2 diabetes mellitus with other skin ulcer E11.40 Type 2 diabetes mellitus with diabetic neuropathy, unspecified Facility Procedures CPT4 Description Modifier Quantity Code [id number] [phone] BILATERAL: Application of multi-layer venous compression system; leg (below knee), including ankle and 1 foot. Electronic Signature(s) Signed: 04/25/2021 4:01:47 PM By: Lenda Kelp PA-C Signed: 04/25/2021 5:52:09 PM By: Shawn Stall Entered By: Shawn Stall on 04/25/2021 11:56:50

## 2021-04-25 NOTE — Progress Notes (Signed)
SEON, GAERTNER ([id number]) Visit Report for 04/25/2021 Arrival Information Details Patient Name: Date of Service: CO DANYELL, AWBREY 04/25/2021 10:30 A M Medical Record Number: [id number] Patient Account Number: [id number] Date of Birth/Sex: Treating RN: January 11, 1951 (70 y.o. [name] Primary Care Chriselda Leppert: Nicoletta Ba Other Clinician: Referring Zyriah Mask: Treating Arjay Jaskiewicz/Extender: Adele Dan in Treatment: 274 Visit Information History Since Last Visit Added or deleted any medications: No Patient Arrived: Walker Any new allergies or adverse reactions: No Arrival Time: 11:10 Had a fall or experienced change in No Accompanied By: self activities of daily living that may affect Transfer Assistance: None risk of falls: Patient Identification Verified: Yes Signs or symptoms of abuse/neglect since No Secondary Verification Process Completed: Yes last visito Patient Requires Transmission-Based Precautions: No Hospitalized since last visit: No Patient Has Alerts: Yes Implantable device outside of the clinic No excluding cellular tissue based products placed in the center since last visit: Has Dressing in Place as Prescribed: Yes Has Compression in Place as Prescribed: Yes Has Footwear/Offloading in Place as Yes Prescribed: Left: Surgical Shoe with Pressure Relief Insole Right: Surgical Shoe with Pressure Relief Insole Pain Present Now: No Electronic Signature(s) Signed: 04/25/2021 5:52:09 PM By: Shawn Stall Entered By: Shawn Stall on 04/25/2021 11:52:15 -------------------------------------------------------------------------------- Compression Therapy Details Patient Name: Date of Service: BROADY, LAFOY 04/25/2021 10:30 A M Medical Record Number: [id number] Patient Account Number: [id number] Date of Birth/Sex: Treating RN: [birth year] (70 y.o. [name] Primary Care Ekam Bonebrake: Nicoletta Ba Other Clinician: Referring  Klani Caridi: Treating Uvaldo Rybacki/Extender: Adele Dan in Treatment: 274 Compression Therapy Performed for Wound Assessment: NonWound Condition Lymphedema - Right Leg Performed By: Clinician Shawn Stall, RN Compression Type: Four Layer Electronic Signature(s) Signed: 04/25/2021 5:52:09 PM By: Shawn Stall Entered By: Shawn Stall on 04/25/2021 11:54:12 -------------------------------------------------------------------------------- Compression Therapy Details Patient Name: Date of Service: MAKIAH, CLAUSON 04/25/2021 10:30 A M Medical Record Number: [id number] Patient Account Number: [id number] Date of Birth/Sex: Treating RN: [birth year] (69 y.o. [name] Primary Care Dima Mini: Nicoletta Ba Other Clinician: Referring Katori Wirsing: Treating Panagiotis Oelkers/Extender: Adele Dan in Treatment: 274 Compression Therapy Performed for Wound Assessment: NonWound Condition Lymphedema - Left Leg Performed By: Clinician Shawn Stall, RN Compression Type: Four Layer Electronic Signature(s) Signed: 04/25/2021 5:52:09 PM By: Shawn Stall Entered By: Shawn Stall on 04/25/2021 11:54:32 -------------------------------------------------------------------------------- Encounter Discharge Information Details Patient Name: Date of Service: [facility], Lothar J. 04/25/2021 10:30 A M Medical Record Number: [id number] Patient Account Number: [id number] Date of Birth/Sex: Treating RN: 03-Jul-1951 (70 y.o. [name] Primary Care Roan Sawchuk: Nicoletta Ba Other Clinician: Referring Rivers Gassmann: Treating Shterna Laramee/Extender: Adele Dan in Treatment: [id number] Encounter Discharge Information Items Discharge Condition: Stable Ambulatory Status: Walker Discharge Destination: Home Transportation: Private Auto Accompanied By: self Schedule Follow-up Appointment: Yes Clinical Summary of Care: Electronic Signature(s) Signed: 04/25/2021  5:52:09 PM By: Shawn Stall Entered By: Shawn Stall on 04/25/2021 11:56:30 -------------------------------------------------------------------------------- Patient/Caregiver Education Details Patient Name: Date of Service: [name], [name] 6/22/2022andnbsp10:30 A M Medical Record Number: [id number] Patient Account Number: [id number] Date of Birth/Gender: Treating RN: 1951/03/20 (70 y.o. [name] Primary Care Physician: Nicoletta Ba Other Clinician: Referring Physician: Treating Physician/Extender: Adele Dan in Treatment: 274 Education Assessment Education Provided To: Patient Education Topics Provided Wound/Skin Impairment: Handouts: Skin Care Do's and Dont's Methods: Explain/Verbal Responses: Return demonstration correctly Electronic Signature(s) Signed: 04/25/2021 5:52:09 PM By: Shawn Stall Entered By: Shawn Stall on  04/25/2021 11:56:10 -------------------------------------------------------------------------------- Wound Assessment Details Patient Name: Date of Service: CO HARALD, QUEVEDO 04/25/2021 10:30 A M Medical Record Number: [id number] Patient Account Number: [id number] Date of Birth/Sex: Treating RN: 1951-04-28 (70 y.o. [name] Primary Care Posie Lillibridge: Nicoletta Ba Other Clinician: Referring Carnita Golob: Treating Caelyn Route/Extender: Adele Dan in Treatment: 274 Wound Status Wound Number: [id number] Primary Diabetic Wound/Ulcer of the Lower Extremity Etiology: Wound Location: Left T Second oe Wound Open Wounding Event: Gradually Appeared Status: Date Acquired: 01/31/2021 Comorbid Chronic sinus problems/congestion, Arrhythmia, Hypertension, Weeks Of Treatment: 12 History: Peripheral Arterial Disease, Type II Diabetes, History of Burn, Clustered Wound: No Gout, Confinement Anxiety Wound Measurements Length: (cm) 1 Width: (cm) 0.8 Depth: (cm) 0.1 Area: (cm) 0.628 Volume: (cm) 0.063 %  Reduction in Area: 76.3% % Reduction in Volume: 76.2% Epithelialization: Small (1-33%) Tunneling: No Undermining: No Wound Description Classification: Grade 2 Wound Margin: Distinct, outline attached Exudate Amount: Medium Exudate Type: Serosanguineous Exudate Color: red, brown Foul Odor After Cleansing: No Slough/Fibrino No Wound Bed Granulation Amount: Large (67-100%) Exposed Structure Granulation Quality: Pink, Pale Fascia Exposed: No Necrotic Amount: None Present (0%) Fat Layer (Subcutaneous Tissue) Exposed: No Tendon Exposed: No Muscle Exposed: No Joint Exposed: No Bone Exposed: No Treatment Notes Wound #193 (Toe Second) Wound Laterality: Left Cleanser Peri-Wound Care Topical Primary Dressing KerraCel Ag Gelling Fiber Dressing, 2x2 in (silver alginate) Discharge Instruction: Apply silver alginate to wound bed as instructed Secondary Dressing Woven Gauze Sponges 2x2 in Discharge Instruction: Apply over primary dressing as directed. Secured With Conforming Stretch Gauze Bandage, Sterile 2x75 (in/in) Discharge Instruction: Secure with stretch gauze as directed. Compression Wrap Compression Stockings Add-Ons Notes BLE 4layer compression, lotion, stockinette. Electronic Signature(s) Signed: 04/25/2021 5:52:09 PM By: Shawn Stall Entered By: Shawn Stall on 04/25/2021 11:53:28 -------------------------------------------------------------------------------- Vitals Details Patient Name: Date of Service: [name], Crewe J. 04/25/2021 10:30 A M Medical Record Number: [id number] Patient Account Number: [id number] Date of Birth/Sex: Treating RN: 01/21/1951 (70 y.o. [name] Primary Care Shraga Custard: Nicoletta Ba Other Clinician: Referring Jaskaran Dauzat: Treating Laurel Harnden/Extender: Adele Dan in Treatment: 274 Vital Signs Time Taken: 11:10 Temperature (F): 98.1 Height (in): 70 Pulse (bpm): 81 Weight (lbs): 380.2 Respiratory Rate  (breaths/min): 20 Body Mass Index (BMI): 54.5 Blood Pressure (mmHg): 157/77 Reference Range: 80 - 120 mg / dl Electronic Signature(s) Signed: 04/25/2021 5:52:09 PM By: Shawn Stall Entered By: Shawn Stall on 04/25/2021 11:52:31

## 2021-04-27 ENCOUNTER — Encounter: Payer: Self-pay | Admitting: Family Medicine

## 2021-04-27 ENCOUNTER — Ambulatory Visit (INDEPENDENT_AMBULATORY_CARE_PROVIDER_SITE_OTHER): Payer: Medicare Other | Admitting: Family Medicine

## 2021-04-27 ENCOUNTER — Other Ambulatory Visit: Payer: Self-pay

## 2021-04-27 ENCOUNTER — Telehealth: Payer: Self-pay

## 2021-04-27 VITALS — BP 131/68 | HR 67 | Temp 97.8°F | Resp 16 | Ht 69.0 in | Wt 374.8 lb

## 2021-04-27 DIAGNOSIS — T50905A Adverse effect of unspecified drugs, medicaments and biological substances, initial encounter: Secondary | ICD-10-CM

## 2021-04-27 DIAGNOSIS — I10 Essential (primary) hypertension: Secondary | ICD-10-CM

## 2021-04-27 MED ORDER — SPIRONOLACTONE 25 MG PO TABS: 25.0000 mg | ORAL_TABLET | Freq: Every day | ORAL | 0 refills | Status: DC

## 2021-04-27 NOTE — Progress Notes (Signed)
OFFICE VISIT  04/27/2021  CC:  Chief Complaint  Patient presents with   Follow-up    hypertension    HPI:    Patient is a 70 y.o. Caucasian male who presents for 1 wk f/u HTN. A/P as of last visit: "Uncontrolled HTN in the setting of CRI stage IIIa. Recent increase in lisinopril to 40mg  has not helped bp much. Bradycardia precludes any increase in his lopressor (continue 1/2 of 50mg  tab bid).He is already on alpha blocker (hytrin 10mg  qd) and CCB (diltiazam XR 240 qd).   I hesitant to use clonidine b/c of potential for making his bradycardia worse. Have to avoid thiazide d/t pt being on high dose lasix. Will go ahead and add hydralazine 10mg , 1 tid.  Discussed small chance of possible inc in LE swelling from this med. Cont home monitoring->he has a wrist cuff and sounds like prospects for getting an upper arm cuff are not good. Monitor sCr and lytes today."  INTERIM HX: Electrolytes normal and kidney function stable 1 wk ago (sCr 1.39, GFR 51).  Home BPs: he can't recall, did not bring #s in today. Since starting hydralazine he notes signif sleepiness and foggy head feeling.  Cut back to 1 bid instead of tid and foggy headed feeling resolved but still signif sleepy.   Past Medical History:  Diagnosis Date   ALLERGIC RHINITIS 08/11/2006   ASTHMA 08/11/2006   Chronic atrial fibrillation (HCC) 08/2014   Chronic combined systolic and diastolic CHF (congestive heart failure) [facility])    Cardiology f/u 12/2017: pt volume overloaded (R heart dysf suspected), BNP very high, lasix increased.  Repeat echo 12/2017: normal LV EF, mild DD, +RV syst dysfxn, mod pulm HTN, biatrial enlgmt.   Chronic constipation    Chronic renal insufficiency, stage 2 (mild)    Borderline stage III (GFR 60s).   Colon cancer screening 02/2021   02/2021 Cologuard POS->GI ref   DIABETES MELLITUS, TYPE II 08/11/2006   DM W/RENAL MANIFESTATIONS, TYPE II 04/20/2007   HYPERTENSION 08/11/2006   Impaired mobility and  endurance    MRSA infection 06/2018   LL venous stasis ulcer infected   Normocytic anemia 2016-2019   03/2018 B12 normal, iron ok (ferritin borderline low).  Plan repeat CBC at f/u summer 2019 and if decreased from baseline will check hemoccults and iron labs again.   OBESITY, MORBID 12/14/2007   saxenda started 05/2020 by [facility] wt mgmt center   OSA (obstructive sleep apnea)    to get sleep study with [facility]) as of 12/03/2018 consult.   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  [id number] ID started him on amoxil prophylaxis---ineffective.  End 2018/Jan 2019 penicillin G injections prophyl helpful but pt declined to continue this as of 01/2018 ID f/u.  ID talked him into resuming monthly penicillin G as of 02/2018 f/u.     Restless leg syndrome    Rx'd clonazepam 09/2017 and pt refused to take it after reading the medication's potential side effects.   Venous stasis ulcers of both lower extremities (HCC)    Severe lymphedema.  wound clinic care ongoing as of 01/2018    Past Surgical History:  Procedure Laterality Date   Carotid dopplers  07/23/2018   Left NORMAL.  Right 1-39% ICA stenosis, with <50% distal CCA stenosis (not hemodynamically significant)   TRANSTHORACIC ECHOCARDIOGRAM  11/2007; 09/2014; 11/2015;12/2017   LV fxn normal, EF normal, mild dilation of left atrium.  2015  grade II diast dysfxn.  2017 EF 55-60%. 2019: LVEF 60-65%, mild RV syst dysf,biatrial enlgmt, mod pulm htn.   URETERAL STENT PLACEMENT     virtual colonoscopy  01/2011   Normal    Outpatient Medications Prior to Visit  Medication Sig Dispense Refill   arginine 500 MG tablet Take by mouth.     b complex vitamins capsule Take 1 capsule by mouth every morning.     butalbital-acetaminophen-caffeine (FIORICET WITH CODEINE) 50-325-40-30 MG capsule TAKE 1 TO 2 CAPSULES BY  MOUTH EVERY 6 HOURS AS  NEEDED FOR HEADACHE(S)  MANUFACTURER RECOMMENDS  NOT EXCEEDING 6 CAPSULES/DAY 60 capsule 0   cetirizine (ZYRTEC) 10 MG tablet Take 10 mg by mouth daily.     CHROMIUM GTF PO Take by mouth daily.     Coenzyme Q10 (CO Q 10) 10 MG CAPS Take by mouth. Reported on 02/19/2016     diltiazem (TIAZAC) 240 MG 24 hr capsule TAKE 1 CAPSULE BY MOUTH  DAILY 90 capsule 1   furosemide (LASIX) 40 MG tablet TAKE 2 TABLETS BY MOUTH TWO TIMES DAILY (Patient taking differently: Take 80 mg in the mornings and 40 mg in the evenings) 360 tablet 3   Glutamine 500 MG CAPS Take by mouth.     HYDROcodone-acetaminophen (NORCO/VICODIN) 5-325 MG tablet 1-2 tabs po bid prn pain 60 tablet 0   Insulin Lispro Prot & Lispro (HUMALOG MIX 75/25 KWIKPEN) (75-25) 100 UNIT/ML Kwikpen INJECT SUBCUTANEOUSLY 25  UNITS EVERY MORNING AND 20  UNITS EVERY EVENING AT  SUPPER 45 mL 3   Insulin Pen Needle (B-D ULTRAFINE III SHORT PEN) 31G X 8 MM MISC USE 1 PENNEEDLE TWICE DAILY 200 each 1   lisinopril (ZESTRIL) 40 MG tablet Take 1 tablet (40 mg total) by mouth daily. 90 tablet 3   metFORMIN (GLUCOPHAGE) 1000 MG tablet TAKE 1 TABLET BY MOUTH  TWICE DAILY WITH MEALS 180 tablet 1   metoprolol tartrate (LOPRESSOR) 50 MG tablet Take 0.5 tablets (25 mg total) by mouth 2 (two) times daily. 90 tablet 3   Multiple Vitamin (MULTIVITAMIN) tablet Take 1 tablet by mouth daily.     ONETOUCH ULTRA test strip CHECK BLOOD SUGAR TWICE  DAILY 200 strip 3   rivaroxaban (XARELTO) 20 MG TABS tablet TAKE 1 TABLET BY MOUTH ONCE DAILY WITH SUPPER 90 tablet 3   terazosin (HYTRIN) 10 MG capsule TAKE 1 CAPSULE BY MOUTH  ONCE DAILY AT BEDTIME 90 capsule 3   vitamin E 400 UNIT capsule Take 400 Units by mouth every morning. Selenium 50mg      hydrALAZINE (APRESOLINE) 10 MG tablet 1 tab po tid (Patient taking differently: Take 10 mg by mouth 2 (two) times daily. 1 tab po tid) 90 tablet 1   Facility-Administered Medications Prior to Visit  Medication Dose Route Frequency Provider Last Rate Last Admin   penicillin g benzathine  (BICILLIN LA) 1200000 UNIT/2ML injection 600,000 Units  600,000 Units Intramuscular Q30 days , MD   600,000 Units at 04/12/20 0933   penicillin g benzathine (BICILLIN LA) 1200000 UNIT/2ML injection 600,000 Units  600,000 Units Intramuscular Q30 days 06/12/20, MD   600,000 Units at 04/04/21 0814   penicillin g benzathine (BICILLIN LA) 1200000 UNIT/2ML injection 600,000 Units  600,000 Units Intramuscular Q30 days 06/04/21, MD   600,000 Units at 04/04/21 0813   penicillin g benzathine (BICILLIN LA) 1200000 UNIT/2ML injection 600,000 Units  600,000 Units Intramuscular Q30 days Tagen Milby, 06/04/21, MD   600,000 Units  at 02/28/21 0857    Allergies  Allergen Reactions   Other Other (See Comments)    Sneezing, coughing    ROS As per HPI  PE: Vitals with BMI 04/27/2021 04/20/2021 04/18/2021  Height 5\' 9"  5\' 9"  5\' 9"   Weight 374 lbs 13 oz 372 lbs 13 oz 375 lbs  BMI 55.32 55.03 55.35  Systolic 131 149  Diastolic 68 67 50  Pulse 67 63 42  Gen: Alert, well appearing.  Patient is oriented to person, place, time, and situation. AFFECT: pleasant, lucid thought and speech. CV: RRR (rate low 60s), no m/r/g.   LUNGS: CTA bilat, nonlabored resps, good aeration in all lung fields. EXT: signif LL edema as per his usual--has both LL's wrapped as per wound clinic.  LABS:    Chemistry      Component Value Date/Time   NA 140 04/20/2021 1152   NA 141 02/09/2020 1453   K 4.5 04/20/2021 1152   CL 100 04/20/2021 1152   CO2 30 04/20/2021 1152   BUN 26 (H) 04/20/2021 1152   BUN 20 02/09/2020 1453   CREATININE 1.39 04/20/2021 1152   CREATININE 1.57 (H) 04/04/2021 0844      Component Value Date/Time   CALCIUM 8.9 04/20/2021 1152   ALKPHOS 70 07/12/2020 1015   AST 15 04/04/2021 0844   ALT 11 04/04/2021 0844   BILITOT 1.0 04/04/2021 0844     Lab Results  Component Value Date   HGBA1C 7.5 (H) 04/04/2021   IMPRESSION AND PLAN:  HTN, uncontrolled, in the setting of  CRI III. Today's bp better.  However, he's having too much sedation on hydralazine so I'll d/c this and do a cautious trial of spironolactone->watching renal fxn and potassium closely. BMET at next visit in 5-6d. Bring bp measurements in for review with me next week.  An After Visit Summary was printed and given to the patient.  FOLLOW UP: Return for 5-6 days f/u HTN.  Signed:  06/04/2021, MD           04/27/2021

## 2021-04-27 NOTE — Telephone Encounter (Signed)
Pt had appt today for f/u HTN, advised to f/u 5-6 days for next f/u. Only current available times are 11:30 and 4pm for Wednesday 6/29. He is unable to make 11:30 appt due to appt with wound care same day. Ok to use 4pm slot or advise of afternoon work in slot for Wednesday?

## 2021-04-27 NOTE — Telephone Encounter (Signed)
Appt scheduled, pt has been made aware of appt time and date. Nothing further needed

## 2021-04-27 NOTE — Patient Instructions (Signed)
Stop hydralazine.  Tomorrow I want you to start the new med I prescribed call spironolactone, 1 25mg  tab once a day.

## 2021-04-27 NOTE — Telephone Encounter (Signed)
OK to use 4pm slot

## 2021-04-27 NOTE — Telephone Encounter (Signed)
Please assist pt with scheduling.  Thanks 

## 2021-04-29 ENCOUNTER — Other Ambulatory Visit: Payer: Self-pay | Admitting: Family Medicine

## 2021-04-30 MED ORDER — HYDROCODONE-ACETAMINOPHEN 5-325 MG PO TABS: ORAL_TABLET | ORAL | 0 refills | Status: DC

## 2021-04-30 NOTE — Telephone Encounter (Signed)
Requesting: [employer] Contract: 04/12/20 UDS: n/a Last Visit: 04/27/21 Next Visit: 05/02/21 Last Refill: 02/26/21(60,0)  Please Advise. Medication pending

## 2021-05-01 ENCOUNTER — Telehealth: Payer: Self-pay | Admitting: Family Medicine

## 2021-05-01 NOTE — Telephone Encounter (Signed)
Pt refused ER/UC recommendations. Please advise if pt can hold medication and what other suggestions you may have.

## 2021-05-01 NOTE — Telephone Encounter (Signed)
Pt called noting new med spironolactone (ALDACTONE) 25 MG tablet . Pt states that he is lightheaded and BP 116/52 & 117/50 when he checked today. Pt was transferred to [facility] with [facility].

## 2021-05-01 NOTE — Telephone Encounter (Signed)
Advised pt to hold new medication until further notice.  [facility] Day - Client TELEPHONE ADVICE RECORD [employer] Patient Name: [name] ER Gender: Male DOB: Jul 22, 1951 Age: 70 Y 26 D Return Phone Number: [phone] (Primary), [phone] (Secondary) Address: [place]  [address] Client [facility] Day - Client Client Site [facility] [place] - Day Physician Santiago Bumpers - MD Contact Type Call Who Is Calling Patient / Member / Family / Caregiver Call Type Triage / Clinical Relationship To Patient Self Return Phone Number [phone] (Primary) Chief Complaint Blood Pressure Low Reason for Call Symptomatic / Request for Health Information Initial Comment Caller states he is lightheaded with blurred vision and his blood pressure is 117/50. He started new medication and isn't sure if this is a side effect. Translation No Nurse Assessment Nurse: Elesa Hacker, RN, Nash Dimmer Date/Time Lamount Cohen Time): 05/01/2021 12:56:42 PM Confirm and document reason for call. If symptomatic, describe symptoms. ---Caller states he is lightheaded with blurred vision and his blood pressure is 117/50. He started new medication and isn't sure if this is a side effect. Has seen the MD 3 x in the past 2 weeks. Does the patient have any new or worsening symptoms? ---Yes Will a triage be completed? ---Yes Related visit to physician within the last 2 weeks? ---Yes Does the PT have any chronic conditions? (i.e. diabetes, asthma, this includes High risk factors for pregnancy, etc.) ---Yes List chronic conditions. ---HTN Is this a behavioral health or substance abuse call? ---No Guidelines Guideline Title Affirmed Question Affirmed Notes Nurse Date/Time (Eastern Time) Blood Pressure - Low [1] Fall in systolic BP > 20 mm Hg from normal AND [2] dizzy, lightheaded, or weak Deaton, RN, Nash Dimmer 05/01/2021 12:58:28 PM Disp. Time  Lamount Cohen Time) Disposition Final User 05/01/2021 1:02:31 PM Go to ED Now (or PCP triage) Yes Deaton, RN, Loraine Leriche NOTE: All timestamps contained within this report are represented as [place] Standard Time. CONFIDENTIALTY NOTICE: This fax transmission is intended only for the addressee. It contains information that is legally privileged, confidential or otherwise protected from use or disclosure. If you are not the intended recipient, you are strictly prohibited from reviewing, disclosing, copying using or disseminating any of this information or taking any action in reliance on or regarding this information. If you have received this fax in error, please notify us immediately by telephone so that we can arrange for its return to [place]. Phone: [phone], Toll-Free: [phone], Fax: [phone] Page: 2 of 2 Call Id: [id number] Caller Disagree/Comply Disagree Caller Understands Yes PreDisposition Call Doctor Care Advice Given Per Guideline GO TO ED NOW (OR PCP TRIAGE): ANOTHER ADULT SHOULD DRIVE: * It is better and safer if another adult drives instead of you. CARE ADVICE given per Low Blood Pressure (Adult) guideline. Comments User: Wandra Scot, RN Date/Time Lamount Cohen Time): 05/01/2021 1:11:56 PM Erie Noe at the office advised that he can hold the medication at this time. Spironolactone. Advised caller that she will be in touch with the PCP and someone will be calling him back with further instructions. . Referrals GO TO FACILITY REFUSED

## 2021-05-02 ENCOUNTER — Encounter: Payer: Self-pay | Admitting: Family Medicine

## 2021-05-02 ENCOUNTER — Ambulatory Visit (INDEPENDENT_AMBULATORY_CARE_PROVIDER_SITE_OTHER): Payer: Medicare Other | Admitting: Family Medicine

## 2021-05-02 ENCOUNTER — Encounter (HOSPITAL_BASED_OUTPATIENT_CLINIC_OR_DEPARTMENT_OTHER): Payer: Medicare Other | Admitting: Physician Assistant

## 2021-05-02 ENCOUNTER — Other Ambulatory Visit: Payer: Self-pay

## 2021-05-02 ENCOUNTER — Other Ambulatory Visit: Payer: Self-pay | Admitting: Family Medicine

## 2021-05-02 VITALS — BP 123/56 | HR 66 | Temp 97.8°F | Resp 16 | Ht 69.0 in | Wt 375.0 lb

## 2021-05-02 DIAGNOSIS — E1140 Type 2 diabetes mellitus with diabetic neuropathy, unspecified: Secondary | ICD-10-CM | POA: Diagnosis not present

## 2021-05-02 DIAGNOSIS — R42 Dizziness and giddiness: Secondary | ICD-10-CM | POA: Diagnosis not present

## 2021-05-02 DIAGNOSIS — I10 Essential (primary) hypertension: Secondary | ICD-10-CM | POA: Diagnosis not present

## 2021-05-02 DIAGNOSIS — L97519 Non-pressure chronic ulcer of other part of right foot with unspecified severity: Secondary | ICD-10-CM | POA: Diagnosis not present

## 2021-05-02 DIAGNOSIS — I482 Chronic atrial fibrillation, unspecified: Secondary | ICD-10-CM | POA: Diagnosis not present

## 2021-05-02 DIAGNOSIS — I87332 Chronic venous hypertension (idiopathic) with ulcer and inflammation of left lower extremity: Secondary | ICD-10-CM | POA: Diagnosis not present

## 2021-05-02 DIAGNOSIS — E11622 Type 2 diabetes mellitus with other skin ulcer: Secondary | ICD-10-CM | POA: Diagnosis not present

## 2021-05-02 DIAGNOSIS — I890 Lymphedema, not elsewhere classified: Secondary | ICD-10-CM | POA: Diagnosis not present

## 2021-05-02 DIAGNOSIS — Z794 Long term (current) use of insulin: Secondary | ICD-10-CM | POA: Diagnosis not present

## 2021-05-02 DIAGNOSIS — I272 Pulmonary hypertension, unspecified: Secondary | ICD-10-CM | POA: Diagnosis not present

## 2021-05-02 DIAGNOSIS — Z9119 Patient's noncompliance with other medical treatment and regimen: Secondary | ICD-10-CM | POA: Diagnosis not present

## 2021-05-02 DIAGNOSIS — I872 Venous insufficiency (chronic) (peripheral): Secondary | ICD-10-CM | POA: Diagnosis not present

## 2021-05-02 DIAGNOSIS — L97521 Non-pressure chronic ulcer of other 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 of skin: Secondary | ICD-10-CM | POA: Diagnosis not present

## 2021-05-02 DIAGNOSIS — L97829 Non-pressure chronic ulcer of other part of left lower leg with unspecified severity: Secondary | ICD-10-CM | POA: Diagnosis not present

## 2021-05-02 DIAGNOSIS — E11621 Type 2 diabetes mellitus with foot ulcer: Secondary | ICD-10-CM | POA: Diagnosis not present

## 2021-05-02 DIAGNOSIS — I87333 Chronic venous hypertension (idiopathic) with ulcer and inflammation of bilateral lower extremity: Secondary | ICD-10-CM | POA: Diagnosis not present

## 2021-05-02 DIAGNOSIS — E1151 Type 2 diabetes mellitus with diabetic peripheral angiopathy without gangrene: Secondary | ICD-10-CM | POA: Diagnosis not present

## 2021-05-02 NOTE — Progress Notes (Addendum)
[name], [name] ([id number]) Visit Report for 05/02/2021 Chief Complaint Document Details Patient Name: Date of Service: Kevin [name], [name] 05/02/2021 10:30 A M Medical Record Number: [id number] Patient Account Number: [id number] Date of Birth/Sex: Treating RN: [birth year] (70 y.o. [name] Primary Care Provider: Nicoletta Ba Other Clinician: Referring Provider: Treating Provider/Extender: Adele Dan in Treatment: [phone] Information Obtained from: Patient Chief Complaint patient is here for evaluation of venous/lymphedema ulcers Electronic Signature(s) Signed: 05/02/2021 11:03:14 AM By: Lenda Kelp PA-C Entered By: Lenda Kelp on 05/02/2021 11:03:14 -------------------------------------------------------------------------------- HPI Details Patient Name: Date of Service: [name], [name] 05/02/2021 10:30 A M Medical Record Number: [id number] Patient Account Number: [id number] Date of Birth/Sex: Treating RN: 23-Mar-1951 (70 y.o. [name] Primary Care Provider: Nicoletta Ba Other Clinician: Referring Provider: Treating Provider/Extender: Adele Dan in Treatment: 275 History of Present Illness HPI Description: Referred by PCP for consultation. Patient has long standing history of BLE venous stasis, no prior ulcerations. At beginning of month, developed cellulitis and weeping. Received IM Rocephin followed by Keflex and resolved. Wears compression stocking, appr 6 months old. Not sure strength. No present drainage. 01/22/16 this is a patient who is a type II diabetic on insulin. 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 He tells me he religiously wears pressure stockings of uncertain strength. He was here with weeping edema about 8 months ago but did not have an open wound. Roughly a month ago he had a reopening on his bilateral legs. He is been using bandages and Neosporin. He  does not complain of pain. 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 Last BUN/creatinine I have is from 11/20/15 at 13 and 1.0 respectively 01/29/16; patient arrives today having tolerated the Profore wrap. He brought in his stockings and these are 18 mmHg stockings he bought from [place]. The compression here is likely inadequate. He does not complain of pain or excessive drainage she has no systemic symptoms. The wound on the right looks improved as does the one on the left although one on the left is more substantial with still tissue at risk below the actual wound area on the bilateral posterior calf 02/05/16; patient arrives with poor edema control. He states that we did put a 4 layer compression on it last week. No weight appear 5 this. 02/12/16; the area on the posterior right Has healed. The left Has a substantial wound that has necrotic surface eschar that requires a debridement with a curette. 02/16/16;the patient called or a Nurse visit secondary to increased swelling. He had been in earlier in the week with his right leg healed. He was transitioned to is on pressure stocking on the right leg with the only open wound on the left, a substantial area on the left posterior calf. Note he has a history of severe lower extremity edema, he has a history of chronic atrial fibrillation but not heart failure per my notes but I'll need to research this. He is not complaining of chest pain shortness of breath or orthopnea. The intake nurse noted blisters on the previously closed right leg 02/19/16; this is the patient's regular visit day. I see him on Friday with escalating edema new wounds on the right leg and clear signs of at least right ventricular heart failure. I increased his Lasix to 40 twice a  day. He is returning currently in follow-up. States he is noticed a decrease in that the  edema 02/26/16 patient's legs have much less edema. There is nothing really open on the right leg. The left leg has improved condition of the large superficial wound on the posterior left leg 03/04/16; edema control is very much better. The patient's right leg wounds have healed. On the left leg he continues to have severe venous inflammation on the posterior aspect of the left leg. There is no tenderness and I don't think any of this is cellulitis. 03/11/16; patient's right leg is married healed and he is in his own stocking. The patient's left leg has deteriorated somewhat. There is a lot of erythema around the wound on the posterior left leg. There is also a significant rim of erythema posteriorly just above where the wrap would've ended there is a new wound in this location and a lot of tenderness. Can't rule out cellulitis in this area. 03/15/16; patient's right leg remains healed and he is in his own stocking. The patient's left leg is much better than last review. His major wound on the posterior aspect of his left Is almost fully epithelialized. He has 3 small injuries from the wraps. Really. Erythema seems a lot better on antibiotics 03/18/16; right leg remains healed and he is in his own stocking. The patient's left leg is much better. The area on the posterior aspect of the left calf is fully epithelialized. His 3 small injuries which were wrap injuries on the left are improved only one seems still open his erythema has resolved 03/25/16; patient's right leg remains healed and he is in his own stocking. There is no open area today on the left leg posterior leg is completely closed up. His wrap injuries at the superior aspect of his leg are also resolved. He looks as though he has some irritation on the dorsal ankle but this is fully epithelialized without evidence of infection. 03/28/16; we discharged this patient on Monday. Transitioned him into his own stocking. There were problems almost  immediately with uncontrolled swelling weeping edema multiple some of which have opened. He does not feel systemically unwell in particular no chest pain no shortness of breath and he does not feel 04/08/16; the edema is under better control with the Profore light wrap but he still has pitting edema. There is one large wound anteriorly 2 on the medial aspect of his left leg and 3 small areas on the superior posterior calf. Drainage is not excessive he is tolerating a Profore light well 04/15/16; put a Profore wrap on him last week. This is controlled is edema however he had a lot of pain on his left anterior foot most of his wounds are healed 04/22/16 once again the patient has denuded areas on the left anterior foot which he states are because his wrap slips up word. He saw his primary physician today is on Lasix 40 twice a day and states that he his weight is down 20 pounds over the last 3 months. 04/29/16: Much improved. left anterior foot much improved. He is now on Lasix 80 mg per day. Much improved edema control 05/06/16; I was hoping to be able to discharge him today however once again he has blisters at a low level of where the compression was placed last week mostly on his left lateral but also his left medial leg and a small area on the anterior part of the left foot. 05/09/16; apparently the patient  went home after his appointment on 7/4 later in the evening developing pain in his upper medial thigh together with subjective fever and chills although his temperature was not taken. The pain was so intense he felt he would probably have to call 911. However he then remembered that he had leftover doxycycline from a previous round of antibiotics and took these. By the next morning he felt a lot better. He called and spoke to one of our nurses and I approved doxycycline over the phone thinking that this was in relation to the wounds we had previously seen although they were definitely were not. The  patient feels a lot better old fever no chills he is still working. Blood sugars are reasonably controlled 05/13/16; patient is back in for review of his cellulitis on his anterior medial upper thigh. He is taking doxycycline this is a lot better. Culture I did of the nodular area on the dorsal aspect of his foot grew MRSA this also looks a lot better. 05/20/16; the patient is cellulitis on the medial upper thigh has resolved. All of his wound areas including the left anterior foot, areas on the medial aspect of the left calf and the lateral aspect of the calf at all resolved. He has a new blister on the left dorsal foot at the level of the fourth toe this was excised. No evidence of infection 05/27/16; patient continues to complain weeping edema. He has new blisterlike wounds on the left anterior lateral and posterior lateral calf at the top of his wrap levels. The area on his left anterior foot appears better. He is not complaining of fever, pain or pruritus in his feet. 05/30/16; the patient's blisters on his left anterior leg posterior calf all look improved. He did not increase the Lasix 100 mg as I suggested because he was going to run out of his 40 mg tablets. He is still having weeping edema of his toes 06/03/16; I renewed his Lasix at 80 mg once a day as he was about to run out when I last saw him. He is on 80 mg of Lasix now. I have asked him to cut down on the excessive amount of water he was drinking and asked him to drink according to his thirst mechanisms 06/12/2016 -- was seen 2 days ago and was supposed to wear his compression stockings at home but he is developed lymphedema and superficial blisters on the left lower extremity and hence came in for a review 06/24/16; the remaining wound is on his left anterior leg. He still has edema coming from between his toes. There is lymphedema here however his edema is generally better than when I last saw this. He has a history of atrial fibrillation  but does not have a known history of congestive heart failure nevertheless I think he probably has this at least on a diastolic basis. 07/01/16 I reviewed his echocardiogram from January 2017. This was essentially normal. He did not have LVH, EF of 55-60%. His right ventricular function was normal although he did have trivial tricuspid and pulmonic regurgitation. This is not audible on exam however. I increased his Lasix to do massive edema in his legs well above his knees I think in early July. He was also drinking an excessive amount of water at the time. 07/15/16; missed his appointment last week because of the Labor Day holiday on Monday. He could not get another appointment later in the week. Started to feel the wrap digging in superiorly  so we remove the top half and the bottom half of his wrap. He has extensive erythema and blistering superiorly in the left leg. Very tender. Very swollen. Edema in his foot with leaking edema fluid. He has not been systemically unwell 07/22/16; the area on the left leg laterally required some debridement. The medial wounds look more stable. His wrap injury wounds appear to have healed. Edema and his foot is better, weeping edema is also better. He tells me he is meeting with the supplier of the external compression pumps at work 08/05/16; [place] His wrap is been on for an extended period of time. Also over the weekend he developed an extensive area of tender erythema across his anterior medial thigh. He took to doxycycline yesterday that he had leftover from a previous prescription. The patient complains of weeping edema coming out of his toes 08/08/16; I saw this patient on 10/2. He was tender across his anterior thigh. I put him on doxycycline. He returns today in follow-up. He does not have any open wounds on his lower leg, he still has edema weeping into his toes. 08/12/16; patient was seen back urgently today to  follow-up for his extensive left thigh cellulitis/erysipelas. He comes back with a lot less swelling and erythema pain is much better. I believe I gave him Augmentin and Cipro. His wrap was cut down as he stated a roll down his legs. He developed blistering above the level of the wrap that remained. He has 2 open blisters and 1 intact. 08/19/16; patient is been doing his primary doctor who is increased his Lasix from 40-80 once a day or 80 already has less edema. Cellulitis has remained improved in the left thigh. 2 open areas on the posterior left calf 08/26/16; he returns today having new open blisters on the anterior part of his left leg. He has his compression pumps but is not yet been shown how to use some vital representative from the supplier. 09/02/16 patient returns today with no open wounds on the left leg. Some maceration in his plantar toes 09/10/2016 -- Dr. Leanord Hawkingobson had recently discharged him on 09/02/2016 and he has come right back with redness swelling and some open ulcers on his left lower extremity. He says this was caused by trying to apply his compression stockings and he's been unable to use this and has not been able to use his lymphedema pumps. He had some doxycycline leftover and he has started on this a few days ago. 09/16/16; there are no open wounds on his leg on the left and no evidence of cellulitis. 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 He does not complain of symptoms here. I am not clear use using his external compression pumps. 09/23/16; I have not seen this patient in 2 weeks. He canceled his appointment 10 days ago as he was going on vacation. He tells me that on Monday he noticed a large area on his posterior left leg which is been draining copiously and is reopened into a large wound. He is been using ABDs and the external part of his juxtalite, according to our nurse this was not on properly. 10/07/16;  Still a substantial area on the posterior left leg. Using silver alginate 10/14/16; in general better although there is still open area which looks healthy. Still using silver alginate. He reminds me that this happen [place] T oday while he was showering in the morning. He had been using his juxtalite's 10/21/16; the area on his posterior left leg is fully epithelialized. However he arrives today with a large area of tender erythema in his medial and posterior left thigh just above the knee. I have marked the area. Once again he is reluctant to consider hospitalization. I treated him with oral antibiotics in the past for a similar situation with resolution I think with doxycycline however this area it seems more extensive to me. He is not complaining of fever but does have chills and says states he is thirsty. His blood sugar today was in the 140s at home 10/25/16 the area on his posterior left leg is fully epithelialized although there is still some weeping edema. The large area of tenderness and erythema in his medial and posterior left thigh is a lot less tender although there is still a lot of swelling in this thigh. He states he feels a lot better. He is on doxycycline and Augmentin that I started last week. This will continued until Tuesday, December 26. I have ordered a duplex ultrasound of the left thigh rule out DVT whether there is an abscess something that would need to be drained I would also like to know. 11/01/16; he still has weeping edema from a not fully epithelialized area on his left posterior calf. Most of the rest of this looks a lot better. He has completed his antibiotics. His thigh is a lot better. Duplex ultrasound did not show a DVT in the thigh 11/08/16; he comes in today with more Denuded surface epithelium from the posterior aspect of his calf. There is no real evidence of cellulitis. The superior aspect of his wrap appears to have put quite an  indentation in his leg just below the knee and this may have contributed. He does not complain of pain or fever. We have been using silver alginate as the primary dressing. The area of cellulitis in the right thigh has totally resolved. He has been using his compression stockings once a week 11/15/16; the patient arrives today with more loss of epithelium from the posterior aspect of his left calf. He now has a fairly substantial wound in this area. The reason behind this deterioration isn't exactly clear although his edema is not well controlled. He states he feels he is generally more swollen systemically. He is not complaining of chest pain shortness of breath fever. T me he has an appointment with his primary physician in early February. He is on 80 mg of oral ells Lasix a day. He claims compliance with the external compression pumps. He is not having any pain in his legs similar to what he has with his recurrent cellulitis 11/22/16; the patient arrives a follow-up of his large area on his left lateral calf. This looks somewhat better today. He came in earlier in the week for a dressing change since I saw him a week ago. He is not complaining of any pain no shortness of breath no chest pain 11/28/16; the patient arrives for follow-up of his large area on the left lateral calf this does not look better. In fact it is larger weeping edema. The surface of the wound does not look too bad. We have been using silver alginate although I'm not certain that this is a dressing issue. 12/05/16; again the patient follows up for a large wound on the left lateral and left posterior calf this does not look better. There  continues to be weeping edema necrotic surface tissue. More worrisome than this once again there is erythema below the wound involving the distal Achilles and heel suggestive of cellulitis. He is on his feet working most of the day of this is not going well. We are changing his dressing twice a week to  facilitate the drainage. 12/12/16; not much change in the overall dimensions of the large area on the left posterior calf. This is very inflamed looking. I gave him an. Doxycycline last week does not really seem to have helped. He found the wrap very painful indeed it seems to of dog into his legs superiorly and perhaps around the heel. He came in early today because the drainage had soaked through his dressings. 12/19/16- patient arrives for follow-up evaluation of his left lower extremity ulcers. He states that he is using his lymphedema pumps once daily when there is "no drainage". He admits to not using his lipedema pumps while under current treatment. His blood sugars have been consistently between 150-200. 12/26/16; the patient is not using his compression pumps at home because of the wetness on his feet. I've advised him that I think it's important for him to use this daily. He finds his feet too wet, he can put a plastic bag over his legs while he is in the pumps. Otherwise I think will be in a vicious circle. We are using silver alginate to the major area on his left posterior calf 01/02/17; the patient's posterior left leg has further of all into 3 open wounds. All of them covered with a necrotic surface. He claims to be using his compression pumps once a day. His edema control is marginal. Continue with silver alginate 01/10/17; the patient's left posterior leg actually looks somewhat better. There is less edema, less erythema. Still has 3 open areas covered with a necrotic surface requiring debridement. He claims to be using his compression pumps once a day his edema control is better 01/17/17; the patient's left posterior calf look better last week when I saw him and his wrap was changed 2 days ago. He has noted increasing pain in the left heel and arrives today with much larger wounds extensive erythema extending down into the entire heel area especially tender medially. He is not  systemically unwell CBGs have been controlled no fever. Our intake nurse showed me limegreen drainage on his AVD pads. 01/24/17; his usual this patient responds nicely to antibiotics last week giving him Levaquin for presumed Pseudomonas. The whole entire posterior part of his leg is much better much less inflamed and in the case of his Achilles heel area much less tender. He has also had some epithelialization posteriorly there are still open areas here and still draining but overall considerably better 01/31/17- He has continue to tolerate the compression wraps. he states that he continues to use the lymphedema pumps daily, and can increase to twice daily on the weekends. He is voicing no complaints or concerns regarding his LLE ulcers 02/07/17-he is here for follow-up evaluation. He states that he noted some erythema to the left medial and anterior thigh, which he states is new as of yesterday. He is concerned about recurrent cellulitis. He states his blood sugars have been slightly elevated, this morning in the 180s 02/14/17; he is here for follow-up evaluation. When he was last here there was erythema superiorly from his posterior wound in his anterior thigh. He was prescribed Levaquin however a culture of the wound surface grew MRSA over  the phone I changed him to doxycycline on Monday and things seem to be a lot better. 02/24/17; patient missed his appointment on Friday therefore we changed his nurse visit into a physician visit today. Still using silver alginate on the large area of the posterior left thigh. He isn't new area on the dorsal left second toe 03/03/17; actually better today although he admits he has not used his external compression pumps in the last 2 days or so because of work responsibilities over the weekend. 03/10/17; continued improvement. External compression pumps once a day almost all of his wounds have closed on the posterior left calf. Better edema control 03/17/17; in general  improved. He still has 3 small open areas on the lateral aspect of his left leg however most of the area on the posterior part of his leg is epithelialized. He has better edema control. He has an ABD pad under his stocking on the right anterior lower leg although he did not let us look at that today. 03/24/17; patient arrives back in clinic today with no open areas however there are areas on the posterior left calf and anterior left calf that are less than 100% epithelialized. His edema is well controlled in the left lower leg. There is some pitting edema probably lymphedema in the left upper thigh. He uses compression pumps at home once per day. I tried to get him to do this twice a day although he is very reticent. 04/01/2017 -- for the last 2 days he's had significant redness, tenderness and weeping and came in for an urgent visit today. 04/07/17; patient still has 6 more days of doxycycline. He was seen by Dr. Meyer Russel last Wednesday for cellulitis involving the posterior aspect, lateral aspect of his Involving his heel. For the most part he is better there is less erythema and less weeping. He has been on his feet for 12 hours 2 over the weekend. Using his compression pumps once a day 04/14/17 arrives today with continued improvement. Only one area on the posterior left calf that is not fully epithelialized. He has intense bilateral venous inflammation associated with his chronic venous insufficiency disease and secondary lymphedema. We have been using silver alginate to the left posterior calf wound In passing he tells [place] today that the right leg but we have not seen in quite some time has an open area on it but he doesn't want [place] to look at this today states he will show this to [place] next week. 04/21/17; there is no open area on his left leg although he still reports some weeping edema. He showed [place] his right leg today which is the first time we've seen this leg in a long time. He has a large area of  open wound on the right leg anteriorly healthy granulation. Quite a bit of swelling in the right leg and some degree of venous inflammation. He told us about the right leg in passing last week but states that deterioration in the right leg really only happened over the weekend 04/28/17; there is no open area on the left leg although there is an irritated part on the posterior which is like a wrap injury. The wound on the right leg which was new from last week at least to [place] is a lot better. 05/05/17; still no open area on the left leg. Patient is using his new compression stocking which seems to be doing a good job of controlling the edema. He states he is using his  compression pumps once per day. The right leg still has an open wound although it is better in terms of surface area. Required debridement. A lot of pain in the posterior right Achilles marked tenderness. Usually this type of presentation this patient gives concern for an active cellulitis 05/12/17; patient arrives today with his major wound from last week on the right lateral leg somewhat better. Still requiring debridement. He was using his compression stocking on the left leg however that is reopened with superficial wounds anteriorly he did not have an open wound on this leg previously. He is still using his juxta light's once daily at night. He cannot find the time to do this in the morning as he has to be at work by 7 AM 05/19/17; right lateral leg wound looks improved. No debridement required. The concerning area is on the left posterior leg which appears to almost have a subcutaneous hemorrhagic component to it. We've been using silver alginate to all the wounds 05/26/17; the right lateral leg wound continues to look improved. However the area on the left posterior calf is a tightly adherent surface. [place] using silver alginate. Because of the weeping edema in his legs there is very little good alternatives. 06/02/17; the patient left  here last week looking quite good. Major wound on the left posterior calf and a small one on the right lateral calf. Both of these look satisfactory. He tells me that by Wednesday he had noted increased pain in the left leg and drainage. He called on Thursday and Friday to get an appointment here but we were blocked. He did not go to urgent care or his primary physician. He thinks he had a fever on Thursday but did not actually take his temperature. He has not been using his compression pumps on the left leg because of pain. I advised him to go to the emergency room today for IV antibiotics for stents of left leg cellulitis but he has refused I have asked him to take 2 days off work to keep his leg elevated and he has refused this as well. In view of this I'm going to call him and Augmentin and doxycycline. 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 However after a detailed discussion regarding the risk benefits and alternatives he refuses to get admitted to the hospital. With no other recourse I will continue him on oral antibiotics as before and hopefully he'll have his infectious disease consultation this week. 06/16/2017 -- the patient was seen today by the nurse practitioner at infectious disease Ms. Dixon. 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 He has also been advise Lotrimin cream locally. she also had wise IV antibiotics for his condition if it flares up 06/23/17; patient arrives today with drainage bilaterally although the remaining wound on the left posterior calf after  cleaning up today "highlighter yellow drainage" did not look too bad. Unfortunately he has had breakdown on the right anterior leg [previously this leg had not been open and he is using a black stocking] he went to see infectious  disease and is been put on clindamycin 150 mg daily, I did not verify the dose although I'm not familiar with using clindamycin in this dosing range, perhaps for prophylaxisoo 06/27/17; I brought this patient back today to follow-up on the wound deterioration on the right lower leg together with surrounding cellulitis. I started him on doxycycline 4 days ago. This area looks better however he comes in today with intense cellulitis on the medial part of his left thigh. This is not have a wound in this area. Extremely tender. We've been using silver alginate to the wounds on the right lower leg left lower leg with bilateral 4 layer compression he is using his external compression pumps once a day 07/04/17; patient's left medial thigh cellulitis looks better. He has not been using his compression pumps as his insert said it was contraindicated with cellulitis. His right leg continues to make improvements all the wounds are still open. We only have one remaining wound on the left posterior calf. Using silver alginate to all open areas. He is on doxycycline which I started a week ago and should be finishing I gave him Augmentin after Thursday's visit for the severe cellulitis on the left medial thigh which fortunately looks better 07/14/17; the patient's left medial thigh cellulitis has resolved. The cellulitis in his right lower calf on the right also looks better. All of his wounds are stable to improved we've been using silver alginate he has completed the antibiotics I have given him. He has clindamycin 150 mg once a day prescribed by infectious disease for prophylaxis, I've advised him to start this now. We have been using bilateral Unna boots over silver alginate to the wound  areas 07/21/17; the patient is been to see infectious disease who noted his recurrent problems with cellulitis. He was not able to tolerate prophylactic clindamycin therefore he is on amoxicillin 500 twice a day. He also had a second daily dose of Lasix added By Dr. Oneta Rack but he is not taking this. Nor is he being completely compliant with his compression pumps a especially not this week. He has 2 remaining wounds one on the right posterior lateral lower leg and one on the left posterior medial lower leg. 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 The patient has Unna boots bilaterally. 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 He has bilateral Unna boots for compression and silver alginate to his wounds. Arrives today with his legs looking as good as I have seen him in quite some time. Not surprisingly his wounds look better as well with improvement on the right lateral leg venous insufficiency wound and also the left medial leg. He is still using the compression pumps once a day 08/11/17; both legs appear to be doing better wounds on the right lateral and left medial legs look better. Skin on the right leg quite good. He is been using silver alginate as the primary dressing. I'm going to use Anasept gel calcium alginate and maintain all the secondary dressings 08/18/17; the patient continues to actually do quite well. The area on his right lateral leg is just about closed the left medial also looks better although it is still moist in this area. His edema is well controlled we have been using Anasept gel with calcium alginate  and the usual secondary dressings, 4 layer compression and once daily use of his compression pumps "always been able to manage 09/01/17; the patient  continues to do reasonably well in spite of his trip to [place]. The area on the right lateral leg is epithelialized. Left is much better but still open. He has more edema and more chronic erythema on the left leg [venous inflammation] 09/08/17; he arrives today with no open wound on the right lateral leg and decently controlled edema. Unfortunately his left leg is not nearly as in his good situation as last week.he apparently had increasing edema starting on Saturday. He edema soaked through into his foot so used a plastic bag to walk around his home. The area on the medial right leg which was his open area is about the same however he has lost surface epithelium on the left lateral which is new and he has significant pain in the Achilles area of the left foot. He is already on amoxicillin chronically for prophylaxis of cellulitis in the left leg 09/15/17; he is completed a week of doxycycline and the cellulitis in the left posterior leg and Achilles area is as usual improved. He still has a lot of edema and fluid soaking through his dressings. There is no open wound on the right leg. He saw infectious disease NP today 09/22/17;As usual 1 we transition him from our compression wraps to his stockings things did not go well. He has several small open areas on the right leg. He states this was caused by the compression wrap on his skin although he did not wear this with the stockings over them. He has several superficial areas on the left leg medially laterally posteriorly. He does not have any evidence of active cellulitis especially involving the left Achilles The patient is traveling from [facility] Saturday going to [facility]. He states he isn't attempting to get an appointment with a heel objects wound center there to change his dressings. I am not completely certain whether this will work 10/06/17; the patient came in on Friday for a nurse visit and the nurse reported that his legs actually look  quite good. He arrives in clinic today for his regular follow-up visit. He has a new wound on his left third toe over the PIP probably caused by friction with his footwear. He has small areas on the left leg and a very superficial but epithelialized area on the right anterior lateral lower leg. Other than that his legs look as good as I've seen him in quite some time. We have been using silver alginate Review of systems; no chest pain no shortness of breath other than this a 10 point review of systems negative 10/20/17; seen by Dr. Meyer Russel last week. He had taken some antibiotics [doxycycline] that he had left over. Dr. Meyer Russel thought he had candida infection and declined to give him further antibiotics. He has a small wound remaining on the right lateral leg several areas on the left leg including a larger area on the left posterior several left medial and anterior and a small wound on the left lateral. The area on the left dorsal third toe looks a lot better. ROS; Gen.; no fever, respiratory no cough no sputum Cardiac no chest pain other than this 10 point review of system is negative 10/30/17; patient arrives today having fallen in the bathtub 3 days ago. It took him a while to get up. He has pain and maceration in the wounds on his  left leg which have deteriorated. He has not been using his pumps he also has some maceration on the right lateral leg. 11/03/17; patient continues to have weeping edema especially in the left leg. This saturates his dressings which were just put on on 12/27. As usual the doxycycline seems to take care of the cellulitis on his lower leg. He is not complaining of fever, chills, or other systemic symptoms. He states his leg feels a lot better on the doxycycline I gave him empirically. He also apparently gets injections at his primary doctor's officeo Rocephin for cellulitis prophylaxis. I didn't ask him about his compression pump compliance today I think that's probably  marginal. Arrives in the clinic with all of his dressings primary and secondary macerated full of fluid and he has bilateral edema 11/10/17; the patient's right leg looks some better although there is still a cluster of wounds on the right lateral. The left leg is inflamed with almost circumferential skin loss medially to laterally although we are still maintaining anteriorly. He does not have overt cellulitis there is a lot of drainage. He is not using compression pumps. We have been using silver alginate to the wound areas, there are not a lot of options here 11/17/17; the patient's right leg continues to be stable although there is still open wounds, better than last week. The inflammation in the left leg is better. Still loss of surface layer epithelium especially posteriorly. There is no overt cellulitis in the amount of edema and his left leg is really quite good, tells me he is using his compression pumps once a day. 11/24/17; patient's right leg has a small superficial wound laterally this continues to improve. The inflammation in the left leg is still improving however we have continuous surface layer epithelial loss posteriorly. There is no overt cellulitis in the amount of edema in both legs is really quite good. He states he is using his compression pumps on the left leg once a day for 5 out of 7 days 12/01/17; very small superficial areas on the right lateral leg continue to improve. Edema control in both legs is better today. He has continued loss of surface epithelialization and left posterior calf although I think this is better. We have been using silver alginate with large number of absorptive secondary dressings 4 layer on the left Unna boot on the right at his request. He tells me he is using his compression pumps once a day 12/08/17; he has no open area on the right leg is edema control is good here. On the left leg however he has marked erythema and tenderness breakdown of skin. He has  what appears to be a wrap injury just distal to the popliteal fossa. 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 There was swelling erythema and breakdown in the right leg. He has much worse skin breakdown in the left leg as well multiple open areas medially and posteriorly as well as laterally. He tells me he has been using his compression pumps but tells me he feels that the drainage out of his leg is worse when he uses a compression pumps. T be fair to him he is been saying this o for a while however I  don't know that I have really been listening to this. I wonder if the compression pumps are working properly 12/22/17;. Once again he arrives with severe erythema, weeping edema from the left greater than right leg. Noncompliance with compression pumps. New this visit he is complaining of pain on the lateral aspect of the right leg and the medial aspect of his right thigh. He apparently saw his cardiologist Dr. [name] who was ordered an echocardiogram area and I think this is a step in the right direction 12/25/17; started his doxycycline Monday night. There is still intense erythema of the right leg especially in the anterior thigh although there is less tenderness. The erythema around the wound on the right lateral calf also is less tender. He still complaining of pain in the left heel. His wounds are about the same right lateral left medial left lateral. Superficial but certainly not close to closure. He denies being systemically unwell no fever chills no abdominal pain no diarrhea 12/29/17; back in follow-up of his extensive right calf and right thigh cellulitis. I added amoxicillin to cover possible doxycycline resistant  strep. This seems to of done the trick he is in much less pain there is much less erythema and swelling. He has his echocardiogram at 11:00 this morning. X-ray of the left heel was also negative. 01/05/18; the patient arrived with his edema under much better control. Now that he is retired he is able to use his compression pumps daily and sometimes twice a day per the patient. He has a wound on the right leg the lateral wound looks better. Area on the left leg also looks a lot better. He has no evidence of cellulitis in his bilateral thighs I had a quick peak at his echocardiogram. He is in normal ejection fraction and normal left ventricular function. He has moderate pulmonary hypertension moderately reduced right ventricular function. One would have to wonder about chronic sleep apnea although he says he doesn't snore. He'll review the echocardiogram with his cardiologist. 01/12/18; the patient arrives with the edema in both legs under exemplary control. He is using his compression pumps daily and sometimes twice daily. His wound on the right lateral leg is just about closed. He still has some weeping areas on the posterior left calf and lateral left calf although everything is just about closed here as well. I have spoken with Aldean Baker who is the patient's nurse practitioner and infectious disease. She was concerned that the patient had not understood that the parenteral penicillin injections he was receiving for cellulitis prophylaxis was actually benefiting him. I don't think the patient actually saw that I would tend to agree we were certainly dealing with less infections although he had a serious one last month. 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 He is healed. He'll be placed in juxtalite compression wraps and increase his lymphedema pumps to twice daily. We will follow up again next week to ensure there are no issues with the new  regiment. 01/20/18-he is here for evaluation of bilateral lower extremity weeping edema. Yesterday he was placed in compression wrap to the right lower extremity and compression stocking to left lower shrubbery. He states he uses lymphedema pumps last night and again this morning and noted a blister to the left lower extremity. On exam he was noted to have drainage to the right lower extremity. He will be placed in Unna boots bilaterally and follow-up next week 01/26/18; patient was actually discharged a  week ago to his own juxta light stockings only to return the next day with bilateral lower extremity weeping edema.he was placed in bilateral Unna boots. He arrives today with pain in the back of his left leg. There is no open area on the right leg however there is a linear/wrap injury on the left leg and weeping edema on the left leg posteriorly. I spoke with infectious disease about 10 days ago. They were disappointed that the patient elected to discontinue prophylactic intramuscular penicillin shots as they felt it was particularly beneficial in reducing the frequency of his cellulitis. I discussed this with the patient today. He does not share this view. He'll definitely need antibiotics today. Finally he is traveling to [place] and trauma leaving this Saturday and returning a week later and he does not travel with his pumps. He is going by car 01/30/18; patient was seen 4 days ago and brought back in today for review of cellulitis in the left leg posteriorly. I put him on amoxicillin this really hasn't helped as much as I might like. He is also worried because he is traveling to [facility] trauma by car. Finally we will be rewrapping him. There is no open area on the right leg over his left leg has multiple weeping areas as usual 02/09/18; The same wrap on for 10 days. He did not pick up the last doxycycline I prescribed for him. He apparently took 4 days worth he already had. There is nothing open on  his right leg and the edema control is really quite good. He's had damage in the left leg medially and laterally especially probably related to the prolonged use of Unna boots 02/12/18; the patient arrived in clinic today for a nurse visit/wrap change. He complained of a lot of pain in the left posterior calf. He is taking doxycycline that I previously prescribed for him. Unfortunately even though he used his stockings and apparently used to compression pumps twice a day he has weeping edema coming out of the lateral part of his right leg. This is coming from the lower anterior lateral skin area. 02/16/18; the patient has finished his doxycycline and will finish the amoxicillin 2 days. The area of cellulitis in the left calf posteriorly has resolved. He is no longer having any pain. He tells me he is using his compression pumps at least once a day sometimes twice. 02/23/18; the patient finished his doxycycline and Amoxil last week. On Friday he noticed a small erythematous circle about the size of a quarter on the left lower leg just above his ankle. This rapidly expanded and he now has erythema on the lateral and posterior part of the thigh. This is bright red. Also has an area on the dorsal foot just above his toes and a tender area just below the left popliteal fossa. He came off his prophylactic penicillin injections at his own insistence one or 2 months ago. This is obviously deteriorated since then 03/02/18; patient is on doxycycline and Amoxil. Culture I did last week of the weeping area on the back of his left calf grew group B strep. I have therefore renewed the amoxicillin 500 3 times a day for a further week. He has not been systemically unwell. Still complaining of an area of discomfort right under his left popliteal fossa. There is no open wound on the right leg. He tells me that he is using his pumps twice a day on most days 03/09/18; patient arrives in clinic today completing  his amoxicillin  today. The cellulitis on his left leg is better. Furthermore he tells me that he had intramuscular penicillin shots that his primary care office today. However he also states that the wrap on his right leg fell down shortly after leaving clinic last week. He developed a large blister that was present when he came in for a nurse visit later in the week and then he developed intense discomfort around this area.He tells me he is using his compression pumps 03/16/18; the patient has completed his doxycycline. The infectious part of this/cellulitis in the left heel area left popliteal area is a lot better. He has 2 open areas on the right calf. Still areas on the left calf but this is a lot better as well. 03/24/18; the patient arrives complaining of pain in the left popliteal area again. He thinks some of this is wrap injury. He has no open area on the right leg and really no open area on the left calf either except for the popliteal area. He claims to be compliant with the compression pumps 03/31/18; I gave him doxycycline last week because of cellulitis in the left popliteal area. This is a lot better although the surface epithelium is denuded off and response to this. He arrives today with uncontrolled edema in the right calf area as well as a fingernail injury in the right lateral calf. There is only a few open areas on the left 04/06/18; I gave him amoxicillin doxycycline over the last 2 weeks that the amoxicillin should be completing currently. He is not complaining of any pain or systemic symptoms. The only open areas see has is on the right lateral lower leg paradoxically I cannot see anything on the left lower leg. He tells me he is using his compression pumps twice a day on most days. Silver alginate to the wounds that are open under 4 layer compression 04/13/18; he completed antibiotics and has no new complaints. Using his compression pumps. Silver alginate that anything that's opened 04/20/18; he is  using his compression pumps religiously. Silver alginate 4 layer compression anything that's opened. He comes in today with no open wounds on the left leg but 3 on the right including a new one posteriorly. He has 2 on the right lateral and one on the right posterior. He likes Unna boots on the right leg for reasons that aren't really clear we had the usual 4 layer compression on the left. It may be necessary to move to the 4 layer compression on the right however for now I left them in the Unna boots 04/27/18; he is using his compression pumps at least once a day. He has still the wounds on the right lateral calf. The area right posteriorly has closed. He does not have an open wound on the left under 4 layer compression however on the dorsal left foot just proximal to the toes and the left third toe 2 small open areas were identified 05/11/18; he has not uses compression pumps. The areas on the right lateral calf have coalesced into one large wound necrotic surface. On the left side he has one small wound anteriorly however the edema is now weeping out of a large part of his left leg. He says he wasn't using his pumps because of the weeping fluid. I explained to him that this is the time he needs to pump more 05/18/18; patient states he is using his compression pumps twice a day. The area on the right lateral  large wound albeit superficial. On the left side he has innumerable number of small new wounds on the left calf particularly laterally but several anteriorly and medially. All these appear to have healthy granulated base these look like the remnants of blisters however they occurred under compression. The patient arrives in clinic today with his legs somewhat better. There is certainly less edema, less multiple open areas on the left calf and the right anterior leg looks somewhat better as well superficial and a little smaller. However he relates pain and erythema over the last 3-4 days in the thigh  and I looked at this today. He has not been systemically unwell no fever no chills no change in blood sugar values 05/25/18; comes in today in a better state. The severe cellulitis on his left leg seems better with the Keflex. Not as tender. He has not been systemically unwell Hard to find an open wound on the left lower leg using his compression pumps twice a day The confluent wounds on his right lateral calf somewhat better looking. These will ultimately need debridement I didn't do this today. 06/01/18; the severe cellulitis on the left anterior thigh has resolved and he is completed his Keflex. There is no open wound on the left leg however there is a superficial excoriation at the base of the third toe dorsally. Skin on the bottom of his left foot is macerated looking. The left the wounds on the lateral right leg actually looks some better although he did require debridement of the top half of this wound area with an open curet 06/09/18 on evaluation today patient appears to be doing poorly in regard to his right lower extremity in particular this appears to likely be infected he has very thick purulent discharge along with a bright green tent to the discharge. This makes me concerned about the possibility of pseudomonas. He's also having increased discomfort at this point on evaluation. Fortunately there does not appear to be any evidence of infection spreading to the other location at this time. 06/16/18 on evaluation today patient appears to actually be doing fairly well. His ulcer has actually diminished in size quite significantly at this point which is good news. Nonetheless he still does have some evidence of infection he did see infectious disease this morning before coming here for his appointment. I did review the results of their evaluation and their note today. They did actually have him discontinue the Cipro and initiate treatment with linezolid at this time. He is doing this for the  next seven days and they recommended a follow-up in four months with them. He is the keep a log of the need for intermittent antibiotic therapy between now and when he falls back up with infectious disease. This will help them gaze what exactly they need to do to try and help them out. 06/23/18; the patient arrives today with no open wounds on the left leg and left third toe healed. He is been using his compression pumps twice a day. On the right lateral leg he still has a sizable wound but this is a lot better than last time I saw this. 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 Has been using silver alginate under 4 layer compression 06/30/18; the only open wound he has is on the right lateral leg and this looks healthy. No debridement is required. We have been using silver  alginate. He does not have an open wound on the left leg. There is apparently some drainage from the dorsal proximal third toe on the left although I see no open wound here. 07/03/18 on evaluation today patient was actually here just for a nurse visit rapid change. However when he was here on Wednesday for his rat change due to having been healed on the left and then developing blisters we initiated the wrap again knowing that he would be back today for [place] to reevaluate and see were at. Unfortunately he has developed some cellulitis into the proximal portion of his right lower extremity even into the region of his thigh. 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 He was placed on one as what at that point. Nonetheless he is done with that and has been tolerating it well otherwise. Doxycycline which in the past really did not seem to be effective for him. Nonetheless I think the best option may be for [place] to definitely reinitiate the antibiotics for a longer period of time. 07/07/18; since I  last saw this patient a week ago he has had a difficult time. At that point he did not have an open wound on his left leg. We transitioned him into juxta light stockings. He was apparently in the clinic the next day with blisters on the left lateral and left medial lower calf. He also had weeping edema fluid. He was put back into a compression wrap. He was also in the clinic on Friday with intense erythema in his right thigh. Per the patient he was started on Bactrim however that didn't work at all in terms of relieving his pain and swelling. He has taken 3 doxycycline that he had left over from last time and that seems to of helped. He has blistering on the right thigh as well. 07/14/18; the erythema on his right thigh has gotten better with doxycycline that he is finishing. 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 Presumably this cellulitis in the thigh was not related to that although I think this is a bit concerning going forward. He still has an area on the right lateral calf the blister on the right medial calf just below the knee that was discussed above. On the left 2 small open areas left medial and left lateral. Edema control is adequate. He is using his compression pumps twice a day 07/20/18; continued improvement in the condition of both legs especially the edema in his bilateral thighs. He tells me he is been losing weight through a combination of diet and exercise. He is using his compression pumps twice a day. So overall she made to the remaining wounds 07/27/2018; continued improvement in condition of both legs. His edema is well controlled. The area on the right lateral leg is just about closed he had one blisters show up on the medial left upper calf. We have him in 4 layer compression. He is going on a 10-day trip to [place], [place] and [place]. He will be driving. He wants to wear Unna boots because of  the lessening amount of constriction. He will not use compression pumps while he is away 08/05/18 on evaluation today patient actually appears to be doing decently well all things considered in regard to his bilateral lower extremities. The worst ulcer is actually only posterior aspect of his left lower  extremity with a four layer compression wrap cut into his leg a couple weeks back. He did have a trip and actually had [employer] for the trip that he is worn since he was last here. Nonetheless he feels like the [employer] actually do better for him his swelling is up a little bit but he also with his trip was not taking his Lasix on a regular set schedule like he was supposed to be. He states that obviously the reason being that he cannot drive and keep going without having to urinate too frequently which makes it difficult. He did not have his pumps with him while he was away either which I think also maybe playing a role here too. 08/13/2018; the patient only has a small open wound on the right lateral calf which is a big improvement in the last month or 2. He also has the area posteriorly just below the posterior fossa on the left which I think was a wrap injury from several weeks ago. He has no current evidence of cellulitis. He tells me he is back into his compression pumps twice a day. He also tells me that while he was at the laundromat somebody stole a section of his extremitease stockings 08/20/2018; back in the clinic with a much improved state. He only has small areas on the right lateral mid calf which is just about healed. This was is more substantial area for quite a prolonged period of time. He has a small open area on the left anterior tibia. The area on the posterior calf just below the popliteal fossa is closed today. He is using his compression pumps twice a day 08/28/2018; patient has no open wound on the right leg. He has a smattering of open areas on the calf with some  weeping lymphedema. More problematically than that it looks as though his wraps of slipped down in his usual he has very angry upper area of edema just below the right medial knee and on the right lateral calf. He has no open area on his feet. The patient is traveling to [facility] next week. I will send him in an antibiotic. We will continue to wrap the right leg. We ordered extremitease stockings for him last week and I plan to transition the right leg to a stocking when he gets home which will be in 10 days time. As usual he is very reluctant to take his pumps with him when he travels 09/07/2018; patient returns from [facility]. He shows me a picture of his left leg in the mid part of his trip last week with intense fire engine erythema. The picture look bad enough I would have considered sending him to the hospital. Instead he went to the wound care center in [facility]. They did not prescribe him antibiotics but he did take some doxycycline he had leftover from a previous visit. I had given him trimethoprim sulfamethoxazole before he left this did not work according to the patient. This is resulted in some improvement fortunately. He comes back with a large wound on the left posterior calf. Smaller area on the left anterior tibia. Denuded blisters on the dorsal left foot over his toes. Does not have much in the way of wounds on the right leg although he does have a very tender area on the right posterior area just below the popliteal fossa also suggestive of infection. He promises me he is back on his pumps twice a  day 09/15/2018; the intense cellulitis in his left lower calf is a lot better. The wound area on the posterior left calf is also so better. However he has reasonably extensive wounds on the dorsal aspect of his second and third toes and the proximal foot just at the base of the toes. There is nothing open on the right leg 09/22/2018; the patient has excellent edema  control in his legs bilaterally. He is using his external compression pumps twice a day. He has no open area on the right leg and only the areas in the left foot dorsally second and third toe area on the left side. He does not have any signs of active cellulitis. 10/06/2018; the patient has good edema control bilaterally. He has no open wound on the right leg. There is a blister in the posterior aspect of his left calf that we had to deal with today. He is using his compression pumps twice a day. There is no signs of active cellulitis. We have been using silver alginate to the wound areas. He still has vulnerable areas on the base of his left first second toes dorsally He has a his extremities stockings and we are going to transition him today into the stocking on the right leg. He is cautioned that he will need to continue to use the compression pumps twice a day. If he notices uncontrolled edema in the right leg he may need to go to 3 times a day. 10/13/2018; the patient came in for a nurse check on Friday he has a large flaccid blister on the right medial calf just below the knee. We unroofed this. He has this and a new area underneath the posterior mid calf which was undoubtedly a blister as well. He also has several small areas on the right which is the area we put his extremities stocking on. 10/19/2018; the patient went to see infectious disease this morning I am not sure if that was a routine follow-up in any case the doxycycline I had given him was discontinued and started on linezolid. He has not started this. It is easy to look at his left calf and the inflammation and think this is cellulitis however he is very tender in the tissue just below the popliteal fossa and I have no doubt that there is infection going on here. He states the problem he is having is that with the compression pumps the edema goes down and then starts walking the wrap falls down. We will see if we can adhere this. He  has 1 or 2 minuscule open areas on the right still areas that are weeping on the posterior left calf, the base of his left second and third toes 10/26/18; back today in clinic with quite of skin breakdown in his left anterior leg. This may have been infection the area below the popliteal fossa seems a lot better however tremendous epithelial loss on the left anterior mid tibia area over quite inexpensive tissue. He has 2 blisters on the right side but no other open wound here. 10/29/2018; came in urgently to see [place] today and we worked him in for review. He states that the 4 layer compression on the right leg caused pain he had to cut it down to roughly his mid calf this caused swelling above the wrap and he has blisters and skin breakdown today. As a result of the pain he has not been using his pumps. Both legs are a lot more edematous and there  is a lot of weeping fluid. 11/02/18; arrives in clinic with continued difficulties in the right leg> left. Leg is swollen and painful. multiple skin blisters and new open areas especially laterally. He has not been using his pumps on the right leg. 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 He is not systemically unwell. 11/09/2018; the patient claims he is being compliant with his pumps. He is finished the doxycycline I gave him last week. Culture I did of the wound on the right lateral leg showed a few very resistant methicillin staph aureus. This was resistant to doxycycline. Nevertheless he states the pain in the leg is a lot better which makes me wonder if the cultured organism was not really what was causing the problem nevertheless this is a very dangerous organism to be culturing out of any wound. His right leg is still a lot larger than the left. He is using an Radio broadcast assistant on this area, he blames a 4-layer compression for causing the original skin breakdown which I doubt is true however I cannot talk him out  of it. We have been using silver alginate to all of these areas which were initially blisters 11/16/2018; patient is being compliant with his external compression pumps at twice a day. Miraculously he arrives in clinic today with absolutely no open wounds. He has better edema control on the left where he has been using 4 layer compression versus wound of wounds on the right and I pointed this out to him. There is no inflammation in the skin in his lower legs which is also somewhat unusual for him. There is no open wounds on the dorsal left foot. He has extremitease stockings at home and I have asked him to bring these in next week. 11/25/18 patient's lower extremity on examination today on the left appears for the most part to be wound free. He does have an open wound on the lateral aspect of the right lower extremity but this is minimal compared to what I've seen in past. He does request that we go ahead and wrap the left leg as well even though there's nothing open just so hopefully it will not reopen in short order. 1/28; patient has superficial open wounds on the right lateral calf left anterior calf and left posterior calf. His edema control is adequate. 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 We have been using silver alginate as the primary dressing. He claims compliance with his compression pumps 2/4; patient has superficial open wounds on numerous areas of his left calf and again one on the left dorsal foot. The areas on the right lateral calf have healed. The cellulitis that I gave him doxycycline for last week is also resolved this was mostly on the left anterior calf just below the tibial tuberosity. His edema looks fairly well-controlled. He tells me he went to see his primary doctor today and had blood work ordered 2/11; once again he has several open areas on the left calf left tibial area. Most of  these are small and appear to have healthy granulation. He does not have anything open on the right. The edema and control in his thighs is pretty good which is usually a good indication he has been using his pumps as requested. 2/18; he continues to have several small areas on the left calf and left tibial area. Most of  these are small healthy granulation. We put him in his stocking on the right leg last week and he arrives with a superficial open area over the right upper tibia and a fairly large area on the right lateral tibia in similar condition. His edema control actually does not look too bad, he claims to be using his compression pumps twice a day 2/25. Continued small areas on the left calf and left tibial area. New areas especially on the right are identified just below the tibial tuberosity and on the right upper tibia itself. There are also areas of weeping edema fluid even without an obvious wound. He does not have a considerable degree of lymphedema but clearly there is more edema here than his skin can handle. He states he is using the pumps twice a day. We have an Unna boot on the right and 4 layer compression on the left. 3/3; he continues to have an area on the right lateral calf and right posterior calf just below the popliteal fossa. There is a fair amount of tenderness around the wound on the popliteal fossa but I did not see any evidence of cellulitis, could just be that the wrap came down and rubbed in this area. He does not have an open area on the left leg however there is an area on the left dorsal foot at the base of the third toe We have been using silver alginate to all wound areas 3/10; he did not have an open area on his left leg last time he was here a week ago. T oday he arrives with a horizontal wound just below the tibial tuberosity and an area on the left lateral calf. He has intense erythema and tenderness in this area. The area is on the right lateral calf and  right posterior calf better than last week. We have been using silver alginate as usual 3/18 - Patient returns with 3 small open areas on left calf, and 1 small open area on right calf, the skin looks ok with no significant erythema, he continues the UNA boot on right and 4 layer compression on left. The right lateral calf wound is closed , the right posterior is small area. we will continue silver alginate to the areas. 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 He is been tolerating the dressing changes without complication. Fortunately she has made excellent improvement in regard to the overall status of his wounds. Unfortunately every time we cease wrapping him he ends up reopening in causing more significant issues at that point. Again I'm unsure of the best direction to take although I think the lymphedema clinic may be appropriate for him. 02/03/19 on evaluation today patient appears to be doing well in regard to the wounds that we saw him for last week unfortunately he has a new area on the proximal portion of his right medial/posterior lower extremity where the wrap somewhat slowed down and caused swelling and a blister to rub and open. Unfortunately this is the only opening that he has on either leg at this point. 02/17/19 on evaluation today patient's bilateral lower extremities appear to be doing well. He still completely healed in regard to the left lower extremity. In regard to the right lower extremity the area where the wrap and slid down and caused the  blister still seems to be slightly open although this is dramatically better than during the last evaluation two weeks ago. I'm very pleased with the way this stands overall. 03/03/19 on evaluation today patient appears to be doing well in regard  to his right lower extremity in general although he did have a new blister open this does not appear to be showing any evidence of active infection at this time. Fortunately there's No fevers, chills, nausea, or vomiting noted at this time. Overall I feel like he is making good progress it does feel like that the right leg will we perform the [employer] seems to do with a bit better than three layer wrap on the left which slid down on him. We may switch to doing bilateral in the book wraps. 5/4; I have not seen Mr. Hotard in quite some time. According to our case manager he did not have an open wound on his left leg last week. He had 1 remaining wound on the right posterior medial calf. He arrives today with multiple openings on the left leg probably were blisters and/or wrap injuries from Unna boots. I do not think the Unna boot's will provide adequate compression on the left. I am also not clear about the frequency he is using the compression pumps. 03/17/19 on evaluation today patient appears to be doing excellent in regard to his lower extremities compared to last week's evaluation apparently. He had gotten significantly worse last week which is unfortunate. The [employer] wrap on the left did not seem to do very well for him at all and in fact it didn't control his swelling significantly enough he had an additional outbreak. Subsequently we go back to the four layer compression wrap on the left. This is good news. At least in that he is doing better and the wound seem to be killing him. He still has not heard anything from the lymphedema clinic. 03/24/19 on evaluation today patient actually appears to be doing much better in regard to his bilateral lower Trinity as compared to last week when I saw him. Fortunately there's no signs of active infection at this time. He has been tolerating the dressing changes without complication. Overall I'm extremely pleased with the progress and appearance in  general. 04/07/19 on evaluation today patient appears to be doing well in regard to his bilateral lower extremities. His swelling is significantly down from where it was previous. With that being said he does have a couple blisters still open at this point but fortunately nothing that seems to be too severe and again the majority of the larger openings has healed at this time. 04/14/19 on evaluation today patient actually appears to be doing quite well in regard to his bilateral lower extremities in fact I'm not even sure there's anything significantly open at this time at any site. Nonetheless he did have some trouble with these wraps where they are somewhat irritating him secondary to the fact that he has noted that the graph wasn't too close down to the end of this foot in a little bit short as well up to his knee. Otherwise things seem to be doing quite well. 04/21/19 upon evaluation today patient's wound bed actually showed evidence of being completely healed in regard to both lower extremities which is excellent news. There does not appear to be any signs of active infection which is also good news. I'm very pleased in this regard. No fevers, chills, nausea, or vomiting noted at this time.  04/28/19 on evaluation today patient appears to be doing a little bit worse in regard to both lower extremities on the left mainly due to the fact that when he went infection disease the wrap was not wrapped quite high enough he developed a blister above this. On the right he is a small open area of nothing too significant but again this is continuing to give him some trouble he has been were in the Velcro compression that he has at home. 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 He's been tolerating the dressing changes without complication. Fortunately there's no signs of active infection at this time. No fevers, chills, nausea, or vomiting noted at  this time. We have been trying to get an appointment with her lymphedema clinic in [facility] but unfortunately nobody can get them on phone with not been able to even fax information over the patient likewise is not been able to get in touch with them. Overall I'm not sure exactly what's going on here with to reach out again today. 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 Still having a lot of drainage unfortunately. He tells me especially in the left but even on the right. There's no signs of active infection which is good news we've been using so ratcheted up to this point. 05/19/19 on evaluation today patient actually appears to be doing quite well with regard to his left lower extremity which is great news. Fortunately in regard to the right lower extremity has an issues with his wrap and he subsequently did remove this from what I'm understanding. Nonetheless long story short is what he had rewrapped once he removed it subsequently had maggots underneath this wrap whenever he came in for evaluation today. With that being said they were obviously completely cleaned away by the nursing staff. The visit today which is excellent news. However he does appear to potentially have some infection around the right ankle region where the maggots were located as well. He will likely require anabiotic therapy today. 05/26/19 on evaluation today patient actually appears to be doing much better in regard to his bilateral lower extremities. I feel like the infection is under much better control. With that being said there were maggots noted when the wrap was removed yet again today. Again this could have potentially been left over from previous although at this time there does not appear to be any signs of significant drainage there was obviously on the wrap some drainage as well this contracted gnats or  otherwise. Either way I do not see anything that appears to be doing worse in my pinion and in fact I think his drainage has slowed down quite significantly likely mainly due to the fact to his infection being under better control. 06/02/2019 on evaluation today patient actually appears to be doing well with regard to his bilateral lower extremities there is no signs of active infection at this time which is great news. With that being said he does have several open areas more so on the right than the left but nonetheless these are all significantly better than previously noted. 06/09/2019 on evaluation today patient actually appears to be doing well. His wrap stayed up and he did not cause any problems he had more drainage on the right compared to the left but overall I do not see any major issues  at this time which is great news. 06/16/2019 on evaluation today patient appears to be doing excellent with regard to his lower extremities the only area that is open is a new blister that can have opened as of today on the medial ankle on the left. Other than this he really seems to be doing great I see no major issues at this point. 06/23/2019 on evaluation today patient appears to be doing quite well with regard to his bilateral lower extremities. In fact he actually appears to be almost completely healed there is a small area of weeping noted of the right lower extremity just above the ankle. Nonetheless fortunately there is no signs of active infection at this time which is good news. No fevers, chills, nausea, vomiting, or diarrhea. 8/24; the patient arrived for a nurse visit today but complained of very significant pain in the left leg and therefore I was asked to look at this. Noted that he did not have an open area on the left leg last week nevertheless this was wrapped. The patient states that he is not been able to put his compression pumps on the left leg because of the discomfort. He has not been  systemically unwell 06/30/2019 on evaluation today patient unfortunately despite being excellent last week is doing much worse with regard to his left lower extremity today. In fact he had to come in for a nurse on Monday where his left leg had to be rewrapped due to excessive weeping Dr. Leanord Hawking placed him on doxycycline at that point. Fortunately there is no signs of active infection Systemically at this time which is good news. 07/07/2019 in regard to the patient's wounds today he actually seems to be doing well with his right lower extremity there really is nothing open or draining at this point this is great news. Unfortunately the left lower extremity is given him additional trouble at this time. There does not appear to be any signs of active infection nonetheless he does have a lot of edema and swelling noted at this point as well as blistering all of which has led to a much more poor appearing leg at this time compared to where it was 2 weeks ago when it was almost completely healed. Obviously this is a little discouraging for the patient. He is try to contact the lymphedema clinic in [place] he has not been able to get through to them. 07/14/2019 on evaluation today patient actually appears to be doing slightly better with regard to his left lower extremity ulcers. Overall I do feel like at least at the top of the wrap that we have been placing this area has healed quite nicely and looks much better. The remainder of the leg is showing signs of improvement. Unfortunately in the thigh area he still has an open region on the left and again on the right he has been utilizing just a Band-Aid on an area that also opened on the thigh. Again this is an area that were not able to wrap although we did do an Ace wrap to provide some compression that something that obviously is a little less effective than the compression wraps we have been using on the lower portion of the leg. He does have an  appointment with the lymphedema clinic in [facility] on Friday. 07/21/2019 on evaluation today patient appears to be doing better with regard to his lower extremity ulcers. He has been tolerating the dressing changes without complication. Fortunately there is no signs of  active infection at this time. No fevers, chills, nausea, vomiting, or diarrhea. I did receive the paperwork from the physical therapist at the lymphedema clinic in [place]. Subsequently I signed off on that this morning and sent that back to him for further progression with the treatment plan. 07/28/2019 on evaluation today patient appears to be doing very well with regard to his right lower extremity where I do not see any open wounds at this point. Fortunately he is feeling great as far as that is concerned as well. In regard to the left lower extremity he has been having issues with still several areas of weeping and edema although the upper leg is doing better his lower leg still I think is going require the compression wrap at this time. No fevers, chills, nausea, vomiting, or diarrhea. 08/04/2019 on evaluation today patient unfortunately is having new wounds on the right lower extremity. Again we have been using Unna boot wrap on that side. We switched him to using his juxta lite wrap at home. With that being said he tells me he has been using it although his legs extremely swollen and to be honest really does not appear that he has been. I cannot know that for sure however. Nonetheless he has multiple new wounds on the right lower extremity at this time. Obviously we will have to see about getting this rewrapped for him today. 08/11/2019 on evaluation today patient appears to be doing fairly well with regard to his wounds. He has been tolerating the dressing changes including the compression wraps without complication. He still has a lot of edema in his upper thigh regions bilaterally he is supposed to be seeing the  lymphedema clinic on the 15th of this month once his wraps arrive for the upper part of his legs. 08/18/2019 on evaluation today patient appears to be doing well with regard to his bilateral lower extremities at this point. He has been tolerating the dressing changes without complication. Fortunately there is no signs of active infection which is also good news. He does have a couple weeping areas on the first and second toe of the right foot he also has just a small area on the left foot upper leg and a small area on the left lower leg but overall he is doing quite well in my opinion. He is supposed to be getting his wraps shortly in fact tomorrow and then subsequently is seeing the lymphedema clinic next Wednesday on the 21st. Of note he is also leaving on the 25th to go on vacation for a week to the beach. For that reason and since there is some uncertainty about what there can be doing at lymphedema clinic next Wednesday I am get a make an appointment for next Friday here for [place] to see what we need to do for him prior to him leaving for vacation. 10/23; patient arrives in considerable pain predominantly in the upper posterior calf just distal to the popliteal fossa also in the wound anteriorly above the major wound. This is probably cellulitis and he has had this recurrently in the past. He has no open wound on the right side and he has had an Radio broadcast assistant in that area. Finally I note that he has an area on the left posterior calf which by enlarge is mostly epithelialized. This protrudes beyond the borders of the surrounding skin in the setting of dry scaly skin and lymphedema. The patient is leaving for [facility] on Sunday. Per his longstanding pattern,  he will not take his compression pumps with him predominantly out of fear that they will be stolen. He therefore asked that we put a Unna boot back on the right leg. He will also contact the wound care center in [facility] to see if they can  change his dressing in the mid week. 11/3; patient returned from his vacation to [facility]. He was seen on 1 occasion at their wound care center. They did a 2 layer compression system as they did not have our 4-layer wrap. I am not completely certain what they put on the wounds. They did not change the Unna boot on the right. The patient is also seeing a lymphedema specialist physical therapist in [place]. It appears that he has some compression sleeve for his thighs which indeed look quite a bit better than I am used to seeing. He pumps over these with his external compression pumps. 11/10; the patient has a new wound on the right medial thigh otherwise there is no open areas on the right. He has an area on the left leg posteriorly anteriorly and medially and an area over the left second toe. We have been using silver alginate. He thinks the injury on his thigh is secondary to friction from the compression sleeve he has. 11/17; the patient has a new wound on the right medial thigh last week. He thinks this is because he did not have a underlying stocking for his thigh juxta lite apparatus. He now has this. The area is fairly large and somewhat angry but I do not think he has underlying cellulitis. He has a intact blister on the right anterior tibial area. Small wound on the right great toe dorsally Small area on the medial left calf. 11/30; the patient does not have any open areas on his right leg and we did not take his juxta lite stocking off. However he states that on Friday his compression wrap fell down lodging around his upper mid calf area. As usual this creates a lot of problems for him. He called urgently today to be seen for a nurse visit however the nurse visit turned into a provider visit because of extreme erythema and pain in the left anterior tibia extending laterally and posteriorly. The area that is problematic is extensive 10/06/2019 upon evaluation today patient actually  appears to be doing poorly in regard to his left lower extremity. He Dr. Leanord Hawking did place him on doxycycline this past Monday apparently due to the fact that he was doing much worse in regard to this left leg. Fortunately the doxycycline does seem to be helping. Unfortunately we are still having a very difficult time getting his edema under any type of control in order to anticipate discharge at some point. The only way were really able to control his lymphedema really is with compression wraps and that has only even seemingly temporary. He has been seeing a lymphedema clinic they are trying to help in this regard but still this has been somewhat frustrating in general for the patient. 10/13/19 on evaluation today patient appears to be doing excellent with regard to his right lower extremity as far as the wounds are concerned. His swelling is still quite extensive unfortunately. He is still having a lot of drainage from the thigh areas bilaterally which is unfortunate. He's been going to lymphedema clinic but again he still really does not have this edema under control as far as his lower extremities are concern. With regard to his left  lower extremity this seems to be improving and I do believe the doxycycline has been of benefit for him. He is about to complete the doxycycline. 10/20/2019 on evaluation today patient appears to be doing poorly in regard to his bilateral lower extremities. More in the right thigh he has a lot of irritation at this site unfortunately. In regard to the left lower extremity the wrap was not quite as high it appears and does seem to have caused him some trouble as well. Fortunately there is no evidence of systemic infection though he does have some blue-green drainage which has me concerned for the possibility of Pseudomonas. He tells me he is previously taking Cipro without complications and he really does not care for Levaquin however due to some of the side effects he  has. He is not allergic to any medications specifically antibiotics that were aware of. 10/27/2019 on evaluation today patient actually does appear to be for the most part doing better when compared to last week's evaluation. With that being said he still has multiple open wounds over the bilateral lower extremities. He actually forgot to start taking the Cipro and states that he still has the whole bottle. He does have several new blisters on left lower extremity today I think I would recommend he go ahead and take the Cipro based on what I am seeing at this point. 12/30-Patient comes at 1 week visit, 4 layer compression wraps on the left and Unna boot on the right, primary dressing Xtrasorb and silver alginate. Patient is taking his Cipro and has a few more days left probably 5-6, and the legs are doing better. He states he is using his compressions devices which I believe he has 11/10/2019 on evaluation today patient actually appears to be much better than last time I saw him 2 weeks ago. His wounds are significantly improved and overall I am very pleased in this regard. Fortunately there is no signs of active infection at this time. He is just a couple of days away from completing Cipro. Overall his edema is much better he has been using his lymphedema pumps which I think is also helping at this point. 11/17/2019 on evaluation today patient appears to be doing excellent in regard to his wounds in general. His legs are swollen but not nearly as much as they have been in the past. Fortunately he is tolerating the compression wraps without complication. No fevers, chills, nausea, vomiting, or diarrhea. He does have some erythema however in the distal portion of his right lower extremity specifically around the forefoot and toes there is a little bit of warmth here as well. 11/24/2019 on evaluation today patient appears to be doing well with regard to his right lower extremity I really do not see any open  wounds at this point. His left lower extremity does have several open areas and his right medial thigh also is open. Other than this however overall the patient seems to be making good progress and I am very pleased at this point. 12/01/2019 on evaluation today patient appears to be doing poorly at this point in regard to his left lower extremity has several new blisters despite the fact that we have him in compression wraps. In fact he had a 4-layer compression wrap, his upper thigh wrapped from lymphedema clinic, and a juxta light over top of the 4 layer compression wrap the lymphedema clinic applied and despite all this he still develop blisters underneath. Obviously this does have me concerned  about the fact that unfortunately despite what we are doing to try to get wounds healed he continues to have new areas arise I do not think he is ever good to be at the point where he can realistically just use wraps at home to keep things under control. Typically when we heal him it takes about 1-2 days before he is back in the clinic with severe breakdown and blistering of his lower extremities bilaterally. This is happened numerous times in the past. Unfortunately I think that we may need some help as far as overall fluid overload to kind of limit what we are seeing and get things under better control. 12/08/2019 on evaluation today patient presents for follow-up concerning his ongoing bilateral lower extremity edema. Unfortunately he is still having quite a bit of swelling the compression wraps are controlling this to some degree but he did see Dr. [name] his cardiologist I do have that available for review today as far as the appointment was concerned that was on 12/06/2019. Obviously that she has been 2 days ago. The patient states that he is only been taking the Lasix 80 mg 1 time a day he had told me previously he was taking this twice a day. Nonetheless Dr. [name] recommended this be up to 80 mg 2 times a  day for the patient as he did appear to be fluid overloaded. With that being said the patient states he did this yesterday and he was unable to go anywhere or do anything due to the fact that he was constantly having to urinate. Nonetheless I think that this is still good to be something that is important for him as far as trying to get his edema under control at all things that he is going to be able to just expect his wounds to get under control and things to be better without going through at least a period of time where he is trying to stabilize his fluid management in general and I think increasing the Lasix is likely the first step here. It was also mentioned the possibility that the patient may require metolazone. With that being said he wanted to have the patient take Lasix twice a day first and then reevaluating 2 months to see where things stand. 12/15/2019 upon evaluation today patient appears to be doing regard to his legs although his toes are showing some signs of weeping especially on the left at this point to some degree on the right. There does not appear to be any signs of active infection and overall I do feel like the compression wraps are doing well for him but he has not been able to take the Lasix at home and the increased dose that Dr. [name] recommended. He tells me that just not go to be feasible for him. Nonetheless I think in this case he should probably send a message to Dr. [name] in order to discuss options from the standpoint of possible admission to get the fluid off or otherwise going forward. 12/22/2019 upon evaluation today patient appears to be doing fairly well with regard to his lower extremities at this point. In fact he would be doing excellent if it was not for the fact that his right anterior thigh apparently had an allergic reaction to adhesive tape that he used. The wound itself that we have been monitoring actually appears to be healed. There is a lot of  irritation at this point. 12/29/2019 upon evaluation today patient appears to be doing well  in regard to his lower extremities. His left medial thigh is open and somewhat draining today but this is the only region that is open the right has done much better with the treatment utilizing the steroid cream that I prescribed for him last week. Overall I am pleased in that regard. Fortunately there is no signs of active infection at this time. No fevers, chills, nausea, vomiting, or diarrhea. 01/05/2020 upon evaluation today patient appears to be doing more poorly in regard to his right lower extremity at this point upon evaluation today. Unfortunately he continues to have issues in this regard and I think the biggest issue is controlling his edema. This obviously is not very well controlled at this point is been recommended that he use the Lasix twice a day but he has not been able to do that. Unfortunately I think this is leading to an issue where honestly he is not really able to effectively control his edema and therefore the wounds really are not doing significantly better. I do not think that he is going to be able to keep things under good control unless he is able to control his edema much better. I discussed this again in great detail with him today. 01/12/2020 good news is patient actually appears to be doing quite well today at this point. He does have an appointment with lymphedema clinic tomorrow. His legs appear healed and the toe on the left is almost completely healed. In general I am very pleased with how things stand at this point. 01/19/2020 upon evaluation today patient appears to actually be doing well in regard to his lower extremities there is nothing open at this point. Fortunately he has done extremely well more recently. Has been seeing lymphedema clinic as well. With that being said he has Velcro wraps for his lower legs as well as his upper legs. The only wound really is on his toe  which is the right great toe and this is barely anything even there. With all that being said I think it is good to be appropriate today to go ahead and switch him over to the Velcro compression wraps. 01/26/2020 upon evaluation today patient appears to be doing worse with regard to his lower extremities after last week switch him to Velcro compression wraps. Unfortunately he lasted less than 24 hours he did not have the sock portion of his Velcro wrap on the left leg and subsequently developed a blister underneath the Velcro portion. Obviously this is not good and not what we were looking for at this point. He states the lymphedema clinic did tell him to wear the wrap for 23 hours and take him off for 1 I am okay with that plan but again right now we got a get things back under control again he may have some cellulitis noted as well. 02/02/2020 upon evaluation today patient unfortunately appears to have several areas of blistering on his bilateral lower extremities today mainly on the feet. His legs do seem to be doing somewhat better which is good news. Fortunately there is no evidence of active infection at this time. No fevers, chills, nausea, vomiting, or diarrhea. 02/16/2020 upon evaluation today patient appears to be doing well at this time with regard to his legs. He has a couple weeping areas on his toes but for the most part everything is doing better and does appear to be sealed up on his legs which is excellent news. We can continue with wrapping him at this point  as he had every time we discontinue the wraps he just breaks out with new wounds. There is really no point in is going forward with this at this point. 03/08/2020 upon evaluation today patient actually appears to be doing quite well with regard to his lower extremity ulcers. He has just a very superficial and really almost nonexistent blister on the left lower extremity he has in general done very well with the compression wraps. With  that being said I do not see any signs of infection at this time which is good news. 03/29/2020 upon evaluation today patient appears to be doing well with regard to his wounds currently except for where he had several new areas that opened up due to some of the wrap slipping and causing him trouble. He states he did not realize they had slipped. Nonetheless he has a 1 area on the right and 3 new areas on the left. Fortunately there is no signs of active infection at this time which is great news. 04/05/2020 upon evaluation today patient actually appears to be doing quite well in general in regard to his legs currently. Fortunately there is no signs of active infection at this time. No fevers, chills, nausea, vomiting, or diarrhea. He tells me next week that he will actually be seen in the lymphedema clinic on Thursday at 10 AM I see him on Wednesday next week. 04/12/2020 upon evaluation today patient appears to be doing very well with regard to his lower extremities bilaterally. In fact he does not appear to have any open wounds at this point which is good news. Fortunately there is no signs of active infection at this time. No fevers, chills, nausea, vomiting, or diarrhea. 04/19/2020 upon evaluation today patient appears to be doing well with regard to his wounds currently on the bilateral lower extremities. There does not appear to be any signs of active infection at this time. Fortunately there is no evidence of systemic infection and overall very pleased at this point. Nonetheless after I held him out last week he literally had blisters the next morning already which swelled up with him being right back here in the clinic. Overall I think that he is just not can be able to be discharged with his legs the way they are he is much to volume overloaded as far as fluid is concerned and that was discussed with him today of also discussed this but should try the clinic nurse manager as well as Dr.  Leanord Hawkingobson. 04/26/2020 upon evaluation today patient appears to be doing better with regard to his wounds currently. He is making some progress and overall swelling is under good control with the compression wraps. Fortunately there is no evidence of active infection at this time. 05/10/2020 on evaluation today patient appears to be doing overall well in regard to his lower extremities bilaterally. He is Tolerating the compression wraps without complication and with what we are seeing currently I feel like that he is making excellent progress. There is no signs of active infection at this time. 05/24/2020 upon evaluation today patient appears to be doing well in regard to his legs. The swelling is actually quite a bit down compared to where it has been in the past. Fortunately there is no sign of active infection at this time which is also good news. With that being said he does have several wounds on his toes that have opened up at this point. 05/31/2020 upon evaluation today patient appears to be doing well  with regard to his legs bilaterally where he really has no significant fluid buildup at this point overall he seems to be doing quite well. Very pleased in this regard. With regard to his toes these also seem to be drying up which is excellent. We have continue to wrap him as every time we tried as a transition to the juxta light wraps things just do not seem to get any better. 06/07/2020 upon evaluation today patient appears to be doing well with regard to his right leg at this point. Unfortunately left leg has a lot of blistering he tells me the wrap started to slide down on him when he tried to put his other Velcro wrap over top of it to help keep things in order but nonetheless still had some issues. 06/14/2020 on evaluation today patient appears to be doing well with regard to his lower extremity ulcers and foot ulcers at this point. I feel like everything is actually showing signs of improvement which  is great news overall there is no signs of active infection at this time. No fevers, chills, nausea, vomiting, or diarrhea. 06/21/2020 on evaluation today patient actually appears to be doing okay in regard to his wounds in general. With that being said the biggest issue I see is on his right foot in particular the first and second toe seem to be doing a little worse due to the fact this is staying very wet. I think he is probably getting need to change out his dressings a couple times in between each week when we see him in regard to his toes in order to keep this drier based on the location and how this is proceeding. 06/28/2020 on evaluation today patient appears to be doing a little bit more poorly overall in regard to the appearance of the skin I am actually somewhat concerned about the possibility of him having a little bit of an infection here. We discussed the course of potentially giving him a doxycycline prescription which he is taken previously with good result. With that being said I do believe that this is potentially mild and at this point easily fixed. I just do not want anything to get any worse. 07/12/2020 upon evaluation today patient actually appears to be making some progress with regard to his legs which is great news there does not appear to be any evidence of active infection. Overall very pleased with where things stand. 07/26/2020 upon evaluation today patient appears to be doing well with regard to his leg ulcers and toe ulcers at this point. He has been tolerating the compression wraps without complication overall very pleased in this regard. 08/09/2020 upon evaluation today patient appears to be doing well with regard to his lower extremities bilaterally. Fortunately there is no signs of active infection overall I am pleased with where things stand. 08/23/2020 on evaluation today patient appears to be doing well with regard to his wound. He has been tolerating the dressing  changes without complication. Fortunately there is no signs of active infection at this time. Overall his legs seem to be doing quite well which is great news and I am very pleased in that regard. No fevers, chills, nausea, vomiting, or diarrhea. 09/13/2020 upon evaluation today patient appears to be doing okay in regard to his lower extremities. He does have a fairly large blister on the right leg which I did remove the blister tissue from today so we can get this to dry out other than that however he  seems to be doing quite well. There is no signs of active infection at this time. 09/27/2020 upon evaluation today patient appears to actually be doing some better in regard to his right leg. Fortunately signs of active infection at this time which is great news. No fevers, chills, nausea, vomiting, or diarrhea. 10/04/2020 upon evaluation today patient actually appears to be showing signs of improvement which is great news with regard to his leg ulcers. Fortunately there is no signs of active infection which is great news he is still taking the antibiotics currently. No fevers, chills, nausea, vomiting, or diarrhea. 10/18/2020 on evaluation today patient appears to be doing well with regard to his legs currently. He has been tolerating the dressing changes including the wraps without complication. Fortunately there is no signs of active infection at this time. No fevers, chills, nausea, vomiting, or diarrhea. 10/25/2020 upon evaluation today patient appears to be doing decently well in regard to his wounds currently. He has been tolerating the dressing changes without complication. Overall I feel like he is making good progress albeit slow. Again this is something we can have to continue to wrap for some time to come most likely. 11/08/2020 upon evaluation today patient appears to be doing well with regard to his wounds currently. He has been tolerating the dressing changes without complication is not  currently on any antibiotics and he does not appear to show any signs of infection. He does continue to have a lot of drainage on the right leg not too severe but nonetheless this is very scattered. On the left leg this is looking to be much improved overall. 11/15/2020 upon evaluation today patient appears to be doing better with regard to his legs bilaterally. Especially the right leg which was much more significant last week. There does not appear to be any signs of active infection which is great news. No fevers, chills, nausea, vomiting, or diarrhea. 11/23/2019 upon evaluation today patient appears to be doing poorly still in regard to his lower extremities bilaterally. Unfortunately his right leg in particular appears to be doing much more poorly there is no signs really of infection this is not warm to touch but he does have a lot of drainage and weeping unfortunately. With that reason I do believe that we may need to initiate some treatment here to try to help calm down some of the swelling of the right leg. I think switching to a 4-layer compression wrap would be beneficial here. The patient is in agreement with giving this a try. 11/29/2020 upon evaluation today patient appears to be doing well currently in regard to his leg ulcers. I feel like the right leg is doing better he still has a lot of drainage but we do see some improvement here. The 4-layer compression wrap I think was helpful. 12/06/2020 upon evaluation today patient appears to be doing well with regard to his legs. In fact they seem to be doing about the best I have seen up to this point. Fortunately there is no signs of active infection at this time. No fevers, chills, nausea, vomiting, or diarrhea. 12/20/2020 upon evaluation today patient appears to be doing well at this time in regard to his legs. He is not having any significant draining which is great news. Fortunately there is no signs of active infection at this time. No fevers,  chills, nausea, vomiting, or diarrhea. 01/17/2021 upon evaluation today [place] actually appears to be doing excellent in regard to his legs. He has a  few areas again that come and go as far as his toes are concerned but overall this is doing quite well. 01/31/2021 upon evaluation today patient appears to be doing well with regard to his legs. Fortunately there does not appear to be any signs of active infection which is great news. Overall he is still having significant edema despite the compression wraps basically the 4-layer compression wrap to just keep things under control there is really not much room for play. 4/13: Mr. Rittenhouse is a longstanding patient in our clinic and benefits greatly from weekly compression wraps. Today he has no complaints. He has been tolerating the wraps well. He states he is using the lymphedema pumps at home. 5/4; patient presents for follow-up of his chronic lymphedema/venous insufficiency ulcers. He comes weekly for compression wraps. He has no complaints today. He was unable to tolerate the Coflex 2 layer Last week so we will do the four press 4-layer compression. He has been using his lymphedema pumps daily. 5/18; patient presents for 2-week follow-up. He has no complaints or issues today. He has developed a new wound to the right foot on his fourth toe. He overall feels well and denies signs of infection. 6/1; patient presents for 2-week follow-up. He has no complaints or issues today. He denies signs of infection. 04/18/2021 upon evaluation today patient appears to be doing well with regard to his legs bilaterally. Family open wound is actually on the toe of his left foot everything else is completely closed which is great news. In general I am extremely pleased with where things stand at this point. The patient is also happy that things are doing so well. 05/02/2021 upon evaluation today patient's legs actually appear to be doing quite well today. Fortunately there  does not appear to be any signs of active infection which is great and overall I am extremely pleased with where he stands today. The patient does not appear to have any evidence of active infection at this time which is also great news. Electronic Signature(s) Signed: 05/02/2021 11:59:26 AM By: Lenda Kelp PA-C Entered By: Lenda Kelp on 05/02/2021 11:59:26 -------------------------------------------------------------------------------- Physical Exam Details Patient Name: Date of Service: [facility], [name] 05/02/2021 10:30 A M Medical Record Number: [id number] Patient Account Number: [id number] Date of Birth/Sex: Treating RN: [birth year] (70 y.o. [name] Primary Care Provider: Nicoletta Ba Other Clinician: Referring Provider: Treating Provider/Extender: Adele Dan in Treatment: 275 Constitutional Well-nourished and well-hydrated in no acute distress. Respiratory normal breathing without difficulty. Psychiatric this patient is able to make decisions and demonstrates good insight into disease process. Alert and Oriented x 3. pleasant and cooperative. Notes Patient's wounds currently appear to be mostly healed as far as the legs are concerned. I am extremely pleased with where we stand at this point and overall I think that the patient is making excellent progress. He does not seem to show any signs whatsoever of active infection at this time which is also great news in fact with the compression wraps in place has been quite a while since he actually became infected. Electronic Signature(s) Signed: 05/02/2021 11:59:54 AM By: Lenda Kelp PA-C Entered By: Lenda Kelp on 05/02/2021 11:59:53 -------------------------------------------------------------------------------- Physician Orders Details Patient Name: Date of Service: [name], [name] 05/02/2021 10:30 A M Medical Record Number: [id number] Patient Account Number: [id number] Date  of Birth/Sex: Treating RN: 02-15-1951 (70 y.o. [name] Primary Care Provider: Nicoletta Ba Other Clinician: Referring Provider: Treating  Provider/Extender: Adele Dan in Treatment: [phone] Verbal / Phone Orders: No Diagnosis Coding ICD-10 Coding Code Description L97.521 Non-pressure chronic ulcer of other 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 of skin L97.829 Non-pressure chronic ulcer of other part of left lower leg with unspecified severity L97.519 Non-pressure chronic ulcer of other part of right foot with unspecified severity I87.333 Chronic venous hypertension (idiopathic) with ulcer and inflammation of bilateral lower extremity I89.0 Lymphedema, not elsewhere classified E11.622 Type 2 diabetes mellitus with other skin ulcer E11.40 Type 2 diabetes mellitus with diabetic neuropathy, unspecified Follow-up Appointments ppointment in 2 weeks. - with Leonard Schwartz Return A Nurse Visit: - 1 week (Wed) Bathing/ Shower/ Hygiene May shower with protection but do not get wound dressing(s) wet. Edema Control - Lymphedema / SCD / Other Bilateral Lower Extremities Lymphedema Pumps. Use Lymphedema pumps on leg(s) 2-3 times a day for 45-60 minutes. If wearing any wraps or hose, do not remove them. Continue exercising as instructed. Elevate legs to the level of the heart or above for 30 minutes daily and/or when sitting, a frequency of: - throughout the day Avoid standing for long periods of time. Exercise regularly Non Wound Condition Bilateral Lower Extremities Other Non Wound Condition Orders/Instructions: - lotion to both legs, 4 layer compression wraps both legs Wound Treatment Wound #193 - T Second oe Wound Laterality: Left Prim Dressing: KerraCel Ag Gelling Fiber Dressing, 2x2 in (silver alginate) Every Other Day/15 Days ary Discharge Instructions: Apply silver alginate to wound bed as instructed Secondary Dressing: Woven Gauze Sponges 2x2 in Every  Other Day/15 Days Discharge Instructions: Apply over primary dressing as directed. Secured With: Insurance underwriter, Sterile 2x75 (in/in) Every Other Day/15 Days Discharge Instructions: Secure with stretch gauze as directed. Electronic Signature(s) Signed: 05/02/2021 4:36:36 PM By: Lenda Kelp PA-C Signed: 05/02/2021 5:41:46 PM By: Zenaida Deed RN, BSN Entered By: Zenaida Deed on 05/02/2021 11:51:32 -------------------------------------------------------------------------------- Problem List Details Patient Name: Date of Service: [facility], Dawayne J. 05/02/2021 10:30 A M Medical Record Number: [id number] Patient Account Number: [id number] Date of Birth/Sex: Treating RN: [birth year] (70 y.o. [name] Primary Care Provider: Nicoletta Ba Other Clinician: Referring Provider: Treating Provider/Extender: Adele Dan in Treatment: [phone] Active Problems ICD-10 Encounter Code Description Active Date MDM Diagnosis L97.521 Non-pressure chronic ulcer of other 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 of 04/27/2018 No Yes skin L97.829 Non-pressure chronic ulcer of other part of left lower leg with unspecified 03/21/2021 No Yes severity L97.519 Non-pressure chronic ulcer of other part of right foot with unspecified severity 03/21/2021 No Yes I87.333 Chronic venous hypertension (idiopathic) with ulcer and inflammation of 01/22/2016 No Yes bilateral lower extremity I89.0 Lymphedema, not elsewhere classified 01/22/2016 No Yes E11.622 Type 2 diabetes mellitus with other skin ulcer 01/22/2016 No Yes E11.40 Type 2 diabetes mellitus with diabetic neuropathy, unspecified 01/22/2016 No Yes Inactive Problems ICD-10 Code Description Active Date Inactive Date L03.115 Cellulitis of right lower limb 12/22/2017 12/22/2017 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 06/30/2018 06/30/2018 L97.511 Non-pressure chronic ulcer of other  part of right foot limited to breakdown of skin 06/30/2018 06/30/2018 Resolved Problems ICD-10 Code Description Active Date Resolved Date L97.211 Non-pressure chronic ulcer of right calf limited to breakdown of skin 06/30/2018 06/30/2018 L97.221 Non-pressure chronic ulcer of left calf limited to breakdown of skin 09/30/2016 09/30/2016 L03.116 Cellulitis of left lower limb 04/01/2017 04/01/2017 L97.211 Non-pressure chronic ulcer of right calf limited to breakdown of skin 06/30/2017  06/30/2017 Electronic Signature(s) Signed: 05/02/2021 11:03:04 AM By: Lenda Kelp PA-C Entered By: Lenda Kelp on 05/02/2021 11:03:03 -------------------------------------------------------------------------------- Progress Note Details Patient Name: Date of Service: [name], [name] 05/02/2021 10:30 A M Medical Record Number: [id number] Patient Account Number: [id number] Date of Birth/Sex: Treating RN: 1951-08-10 (70 y.o. [name] Primary Care Provider: Nicoletta Ba Other Clinician: Referring Provider: Treating Provider/Extender: Adele Dan in Treatment: [phone] Subjective Chief Complaint Information obtained from Patient patient is here for evaluation of venous/lymphedema ulcers History of Present Illness (HPI) Referred by PCP for consultation. Patient has long standing history of BLE venous stasis, no prior ulcerations. At beginning of month, developed cellulitis and weeping. Received IM Rocephin followed by Keflex and resolved. Wears compression stocking, appr 6 months old. Not sure strength. No present drainage. 01/22/16 this is a patient who is a type II diabetic on insulin. 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 He tells me he religiously wears pressure stockings of uncertain strength. He was here with weeping edema about 8 months ago but did not have an open wound. Roughly a month ago he had a reopening on his bilateral legs. He is  been using bandages and Neosporin. He does not complain of pain. 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 Last BUN/creatinine I have is from 11/20/15 at 13 and 1.0 respectively 01/29/16; patient arrives today having tolerated the Profore wrap. He brought in his stockings and these are 18 mmHg stockings he bought from [place]. The compression here is likely inadequate. He does not complain of pain or excessive drainage she has no systemic symptoms. The wound on the right looks improved as does the one on the left although one on the left is more substantial with still tissue at risk below the actual wound area on the bilateral posterior calf 02/05/16; patient arrives with poor edema control. He states that we did put a 4 layer compression on it last week. No weight appear 5 this. 02/12/16; the area on the posterior right Has healed. The left Has a substantial wound that has necrotic surface eschar that requires a debridement with a curette. 02/16/16;the patient called or a Nurse visit secondary to increased swelling. He had been in earlier in the week with his right leg healed. He was transitioned to is on pressure stocking on the right leg with the only open wound on the left, a substantial area on the left posterior calf. Note he has a history of severe lower extremity edema, he has a history of chronic atrial fibrillation but not heart failure per my notes but I'll need to research this. He is not complaining of chest pain shortness of breath or orthopnea. The intake nurse noted blisters on the previously closed right leg 02/19/16; this is the patient's regular visit day. I see him on Friday with escalating edema new wounds on the right leg and clear signs of at least right ventricular heart failure. I increased his Lasix to 40 twice a day. He is returning currently in follow-up. States he is  noticed a decrease in that the edema 02/26/16 patient's legs have much less edema. There is nothing really open on the right leg. The left leg has improved condition of the large superficial wound on the posterior left leg 03/04/16; edema control is very much better. The patient's right leg wounds  have healed. On the left leg he continues to have severe venous inflammation on the posterior aspect of the left leg. There is no tenderness and I don't think any of this is cellulitis. 03/11/16; patient's right leg is married healed and he is in his own stocking. The patient's left leg has deteriorated somewhat. There is a lot of erythema around the wound on the posterior left leg. There is also a significant rim of erythema posteriorly just above where the wrap would've ended there is a new wound in this location and a lot of tenderness. Can't rule out cellulitis in this area. 03/15/16; patient's right leg remains healed and he is in his own stocking. The patient's left leg is much better than last review. His major wound on the posterior aspect of his left Is almost fully epithelialized. He has 3 small injuries from the wraps. Really. Erythema seems a lot better on antibiotics 03/18/16; right leg remains healed and he is in his own stocking. The patient's left leg is much better. The area on the posterior aspect of the left calf is fully epithelialized. His 3 small injuries which were wrap injuries on the left are improved only one seems still open his erythema has resolved 03/25/16; patient's right leg remains healed and he is in his own stocking. There is no open area today on the left leg posterior leg is completely closed up. His wrap injuries at the superior aspect of his leg are also resolved. He looks as though he has some irritation on the dorsal ankle but this is fully epithelialized without evidence of infection. 03/28/16; we discharged this patient on Monday. Transitioned him into his own stocking.  There were problems almost immediately with uncontrolled swelling weeping edema multiple some of which have opened. He does not feel systemically unwell in particular no chest pain no shortness of breath and he does not feel 04/08/16; the edema is under better control with the Profore light wrap but he still has pitting edema. There is one large wound anteriorly 2 on the medial aspect of his left leg and 3 small areas on the superior posterior calf. Drainage is not excessive he is tolerating a Profore light well 04/15/16; put a Profore wrap on him last week. This is controlled is edema however he had a lot of pain on his left anterior foot most of his wounds are healed 04/22/16 once again the patient has denuded areas on the left anterior foot which he states are because his wrap slips up word. He saw his primary physician today is on Lasix 40 twice a day and states that he his weight is down 20 pounds over the last 3 months. 04/29/16: Much improved. left anterior foot much improved. He is now on Lasix 80 mg per day. Much improved edema control 05/06/16; I was hoping to be able to discharge him today however once again he has blisters at a low level of where the compression was placed last week mostly on his left lateral but also his left medial leg and a small area on the anterior part of the left foot. 05/09/16; apparently the patient went home after his appointment on 7/4 later in the evening developing pain in his upper medial thigh together with subjective fever and chills although his temperature was not taken. The pain was so intense he felt he would probably have to call 911. However he then remembered that he had leftover doxycycline from a previous round of antibiotics and took these.  By the next morning he felt a lot better. He called and spoke to one of our nurses and I approved doxycycline over the phone thinking that this was in relation to the wounds we had previously seen although they were  definitely were not. The patient feels a lot better old fever no chills he is still working. Blood sugars are reasonably controlled 05/13/16; patient is back in for review of his cellulitis on his anterior medial upper thigh. He is taking doxycycline this is a lot better. Culture I did of the nodular area on the dorsal aspect of his foot grew MRSA this also looks a lot better. 05/20/16; the patient is cellulitis on the medial upper thigh has resolved. All of his wound areas including the left anterior foot, areas on the medial aspect of the left calf and the lateral aspect of the calf at all resolved. He has a new blister on the left dorsal foot at the level of the fourth toe this was excised. No evidence of infection 05/27/16; patient continues to complain weeping edema. He has new blisterlike wounds on the left anterior lateral and posterior lateral calf at the top of his wrap levels. The area on his left anterior foot appears better. He is not complaining of fever, pain or pruritus in his feet. 05/30/16; the patient's blisters on his left anterior leg posterior calf all look improved. He did not increase the Lasix 100 mg as I suggested because he was going to run out of his 40 mg tablets. He is still having weeping edema of his toes 06/03/16; I renewed his Lasix at 80 mg once a day as he was about to run out when I last saw him. He is on 80 mg of Lasix now. I have asked him to cut down on the excessive amount of water he was drinking and asked him to drink according to his thirst mechanisms 06/12/2016 -- was seen 2 days ago and was supposed to wear his compression stockings at home but he is developed lymphedema and superficial blisters on the left lower extremity and hence came in for a review 06/24/16; the remaining wound is on his left anterior leg. He still has edema coming from between his toes. There is lymphedema here however his edema is generally better than when I last saw this. He has a  history of atrial fibrillation but does not have a known history of congestive heart failure nevertheless I think he probably has this at least on a diastolic basis. 07/01/16 I reviewed his echocardiogram from January 2017. This was essentially normal. He did not have LVH, EF of 55-60%. His right ventricular function was normal although he did have trivial tricuspid and pulmonic regurgitation. This is not audible on exam however. I increased his Lasix to do massive edema in his legs well above his knees I think in early July. He was also drinking an excessive amount of water at the time. 07/15/16; missed his appointment last week because of the Labor Day holiday on Monday. He could not get another appointment later in the week. Started to feel the wrap digging in superiorly so we remove the top half and the bottom half of his wrap. He has extensive erythema and blistering superiorly in the left leg. Very tender. Very swollen. Edema in his foot with leaking edema fluid. He has not been systemically unwell 07/22/16; the area on the left leg laterally required some debridement. The medial wounds look more stable. His wrap injury  wounds appear to have healed. Edema and his foot is better, weeping edema is also better. He tells me he is meeting with the supplier of the external compression pumps at work 08/05/16; the patient was on vacation last week in [facility] His wrap is been on for an extended period of time. Also over the weekend he developed an extensive area of tender erythema across his anterior medial thigh. He took to doxycycline yesterday that he had leftover from a previous prescription. The patient complains of weeping edema coming out of his toes 08/08/16; I saw this patient on 10/2. He was tender across his anterior thigh. I put him on doxycycline. He returns today in follow-up. He does not have any open wounds on his lower leg, he still has edema weeping into his toes. 08/12/16; patient was  seen back urgently today to follow-up for his extensive left thigh cellulitis/erysipelas. He comes back with a lot less swelling and erythema pain is much better. I believe I gave him Augmentin and Cipro. His wrap was cut down as he stated a roll down his legs. He developed blistering above the level of the wrap that remained. He has 2 open blisters and 1 intact. 08/19/16; patient is been doing his primary doctor who is increased his Lasix from 40-80 once a day or 80 already has less edema. Cellulitis has remained improved in the left thigh. 2 open areas on the posterior left calf 08/26/16; he returns today having new open blisters on the anterior part of his left leg. He has his compression pumps but is not yet been shown how to use some vital representative from the supplier. 09/02/16 patient returns today with no open wounds on the left leg. Some maceration in his plantar toes 09/10/2016 -- Dr. Leanord Hawking had recently discharged him on 09/02/2016 and he has come right back with redness swelling and some open ulcers on his left lower extremity. He says this was caused by trying to apply his compression stockings and he's been unable to use this and has not been able to use his lymphedema pumps. He had some doxycycline leftover and he has started on this a few days ago. 09/16/16; there are no open wounds on his leg on the left and no evidence of cellulitis. 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 He does not complain of symptoms here. I am not clear use using his external compression pumps. 09/23/16; I have not seen this patient in 2 weeks. He canceled his appointment 10 days ago as he was going on vacation. He tells me that on Monday he noticed a large area on his posterior left leg which is been draining copiously and is reopened into a large wound. He is been using ABDs and the external part of his juxtalite, according to our nurse this was  not on properly. 10/07/16; Still a substantial area on the posterior left leg. Using silver alginate 10/14/16; in general better although there is still open area which looks healthy. Still using silver alginate. He reminds me that this happen before he left for [facility] T oday while he was showering in the morning. He had been using his juxtalite's 10/21/16; the area on his posterior left leg is fully epithelialized. However he arrives today with a large area of tender erythema in his medial and posterior left thigh just above the knee. I have marked the area. Once again he is reluctant to consider hospitalization.  I treated him with oral antibiotics in the past for a similar situation with resolution I think with doxycycline however this area it seems more extensive to me. He is not complaining of fever but does have chills and says states he is thirsty. His blood sugar today was in the 140s at home 10/25/16 the area on his posterior left leg is fully epithelialized although there is still some weeping edema. The large area of tenderness and erythema in his medial and posterior left thigh is a lot less tender although there is still a lot of swelling in this thigh. He states he feels a lot better. He is on doxycycline and Augmentin that I started last week. This will continued until Tuesday, December 26. I have ordered a duplex ultrasound of the left thigh rule out DVT whether there is an abscess something that would need to be drained I would also like to know. 11/01/16; he still has weeping edema from a not fully epithelialized area on his left posterior calf. Most of the rest of this looks a lot better. He has completed his antibiotics. His thigh is a lot better. Duplex ultrasound did not show a DVT in the thigh 11/08/16; he comes in today with more Denuded surface epithelium from the posterior aspect of his calf. There is no real evidence of cellulitis. The superior aspect of his wrap appears  to have put quite an indentation in his leg just below the knee and this may have contributed. He does not complain of pain or fever. We have been using silver alginate as the primary dressing. The area of cellulitis in the right thigh has totally resolved. He has been using his compression stockings once a week 11/15/16; the patient arrives today with more loss of epithelium from the posterior aspect of his left calf. He now has a fairly substantial wound in this area. The reason behind this deterioration isn't exactly clear although his edema is not well controlled. He states he feels he is generally more swollen systemically. He is not complaining of chest pain shortness of breath fever. T me he has an appointment with his primary physician in early February. He is on 80 mg of oral ells Lasix a day. He claims compliance with the external compression pumps. He is not having any pain in his legs similar to what he has with his recurrent cellulitis 11/22/16; the patient arrives a follow-up of his large area on his left lateral calf. This looks somewhat better today. He came in earlier in the week for a dressing change since I saw him a week ago. He is not complaining of any pain no shortness of breath no chest pain 11/28/16; the patient arrives for follow-up of his large area on the left lateral calf this does not look better. In fact it is larger weeping edema. The surface of the wound does not look too bad. We have been using silver alginate although I'm not certain that this is a dressing issue. 12/05/16; again the patient follows up for a large wound on the left lateral and left posterior calf this does not look better. There continues to be weeping edema necrotic surface tissue. More worrisome than this once again there is erythema below the wound involving the distal Achilles and heel suggestive of cellulitis. He is on his feet working most of the day of this is not going well. We are changing his  dressing twice a week to facilitate the drainage. 12/12/16; not much change  in the overall dimensions of the large area on the left posterior calf. This is very inflamed looking. I gave him an. Doxycycline last week does not really seem to have helped. He found the wrap very painful indeed it seems to of dog into his legs superiorly and perhaps around the heel. He came in early today because the drainage had soaked through his dressings. 12/19/16- patient arrives for follow-up evaluation of his left lower extremity ulcers. He states that he is using his lymphedema pumps once daily when there is "no drainage". He admits to not using his lipedema pumps while under current treatment. His blood sugars have been consistently between 150-200. 12/26/16; the patient is not using his compression pumps at home because of the wetness on his feet. I've advised him that I think it's important for him to use this daily. He finds his feet too wet, he can put a plastic bag over his legs while he is in the pumps. Otherwise I think will be in a vicious circle. We are using silver alginate to the major area on his left posterior calf 01/02/17; the patient's posterior left leg has further of all into 3 open wounds. All of them covered with a necrotic surface. He claims to be using his compression pumps once a day. His edema control is marginal. Continue with silver alginate 01/10/17; the patient's left posterior leg actually looks somewhat better. There is less edema, less erythema. Still has 3 open areas covered with a necrotic surface requiring debridement. He claims to be using his compression pumps once a day his edema control is better 01/17/17; the patient's left posterior calf look better last week when I saw him and his wrap was changed 2 days ago. He has noted increasing pain in the left heel and arrives today with much larger wounds extensive erythema extending down into the entire heel area especially tender medially.  He is not systemically unwell CBGs have been controlled no fever. Our intake nurse showed me limegreen drainage on his AVD pads. 01/24/17; his usual this patient responds nicely to antibiotics last week giving him Levaquin for presumed Pseudomonas. The whole entire posterior part of his leg is much better much less inflamed and in the case of his Achilles heel area much less tender. He has also had some epithelialization posteriorly there are still open areas here and still draining but overall considerably better 01/31/17- He has continue to tolerate the compression wraps. he states that he continues to use the lymphedema pumps daily, and can increase to twice daily on the weekends. He is voicing no complaints or concerns regarding his LLE ulcers 02/07/17-he is here for follow-up evaluation. He states that he noted some erythema to the left medial and anterior thigh, which he states is new as of yesterday. He is concerned about recurrent cellulitis. He states his blood sugars have been slightly elevated, this morning in the 180s 02/14/17; he is here for follow-up evaluation. When he was last here there was erythema superiorly from his posterior wound in his anterior thigh. He was prescribed Levaquin however a culture of the wound surface grew MRSA over the phone I changed him to doxycycline on Monday and things seem to be a lot better. 02/24/17; patient missed his appointment on Friday therefore we changed his nurse visit into a physician visit today. Still using silver alginate on the large area of the posterior left thigh. He isn't new area on the dorsal left second toe 03/03/17; actually better today  although he admits he has not used his external compression pumps in the last 2 days or so because of work responsibilities over the weekend. 03/10/17; continued improvement. External compression pumps once a day almost all of his wounds have closed on the posterior left calf. Better edema control 03/17/17;  in general improved. He still has 3 small open areas on the lateral aspect of his left leg however most of the area on the posterior part of his leg is epithelialized. He has better edema control. He has an ABD pad under his stocking on the right anterior lower leg although he did not let us look at that today. 03/24/17; patient arrives back in clinic today with no open areas however there are areas on the posterior left calf and anterior left calf that are less than 100% epithelialized. His edema is well controlled in the left lower leg. There is some pitting edema probably lymphedema in the left upper thigh. He uses compression pumps at home once per day. I tried to get him to do this twice a day although he is very reticent. 04/01/2017 -- for the last 2 days he's had significant redness, tenderness and weeping and came in for an urgent visit today. 04/07/17; patient still has 6 more days of doxycycline. He was seen by Dr. Meyer Russel last Wednesday for cellulitis involving the posterior aspect, lateral aspect of his Involving his heel. For the most part he is better there is less erythema and less weeping. He has been on his feet for 12 hours o2 over the weekend. Using his compression pumps once a day 04/14/17 arrives today with continued improvement. Only one area on the posterior left calf that is not fully epithelialized. He has intense bilateral venous inflammation associated with his chronic venous insufficiency disease and secondary lymphedema. We have been using silver alginate to the left posterior calf wound In passing he tells [place] today that the right leg but we have not seen in quite some time has an open area on it but he doesn't want [place] to look at this today states he will show this to [place] next week. 04/21/17; there is no open area on his left leg although he still reports some weeping edema. He showed [place] his right leg today which is the first time we've seen this leg in a long time. He has a  large area of open wound on the right leg anteriorly healthy granulation. Quite a bit of swelling in the right leg and some degree of venous inflammation. He told us about the right leg in passing last week but states that deterioration in the right leg really only happened over the weekend 04/28/17; there is no open area on the left leg although there is an irritated part on the posterior which is like a wrap injury. The wound on the right leg which was new from last week at least to [place] is a lot better. 05/05/17; still no open area on the left leg. Patient is using his new compression stocking which seems to be doing a good job of controlling the edema. He states he is using his compression pumps once per day. The right leg still has an open wound although it is better in terms of surface area. Required debridement. A lot of pain in the posterior right Achilles marked tenderness. Usually this type of presentation this patient gives concern for an active cellulitis 05/12/17; patient arrives today with his major wound from last week on the right  lateral leg somewhat better. Still requiring debridement. He was using his compression stocking on the left leg however that is reopened with superficial wounds anteriorly he did not have an open wound on this leg previously. He is still using his juxta light's once daily at night. He cannot find the time to do this in the morning as he has to be at work by 7 AM 05/19/17; right lateral leg wound looks improved. No debridement required. The concerning area is on the left posterior leg which appears to almost have a subcutaneous hemorrhagic component to it. We've been using silver alginate to all the wounds 05/26/17; the right lateral leg wound continues to look improved. However the area on the left posterior calf is a tightly adherent surface. [place] using silver alginate. Because of the weeping edema in his legs there is very little good alternatives. 06/02/17; the  patient left here last week looking quite good. Major wound on the left posterior calf and a small one on the right lateral calf. Both of these look satisfactory. He tells me that by Wednesday he had noted increased pain in the left leg and drainage. He called on Thursday and Friday to get an appointment here but we were blocked. He did not go to urgent care or his primary physician. He thinks he had a fever on Thursday but did not actually take his temperature. He has not been using his compression pumps on the left leg because of pain. I advised him to go to the emergency room today for IV antibiotics for stents of left leg cellulitis but he has refused I have asked him to take 2 days off work to keep his leg elevated and he has refused this as well. In view of this I'm going to call him and Augmentin and doxycycline. 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 However after a detailed discussion regarding the risk benefits and alternatives he refuses to get admitted to the hospital. With no other recourse I will continue him on oral antibiotics as before and hopefully he'll have his infectious disease consultation this week. 06/16/2017 -- the patient was seen today by the nurse practitioner at infectious disease Ms. Dixon. 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 He has also been advise Lotrimin cream locally. she also had wise IV antibiotics for his condition if it flares up 06/23/17; patient arrives today with drainage bilaterally although the remaining wound on the left posterior  calf after cleaning up today "highlighter yellow drainage" did not look too bad. Unfortunately he has had breakdown on the right anterior leg [previously this leg had not been open and he is using a black stocking] he went to see infectious disease and is been put on clindamycin 150 mg daily, I did not verify the dose although I'm not familiar with using clindamycin in this dosing range, perhaps for prophylaxisoo 06/27/17; I brought this patient back today to follow-up on the wound deterioration on the right lower leg together with surrounding cellulitis. I started him on doxycycline 4 days ago. This area  looks better however he comes in today with intense cellulitis on the medial part of his left thigh. This is not have a wound in this area. Extremely tender. We've been using silver alginate to the wounds on the right lower leg left lower leg with bilateral 4 layer compression he is using his external compression pumps once a day 07/04/17; patient's left medial thigh cellulitis looks better. He has not been using his compression pumps as his insert said it was contraindicated with cellulitis. His right leg continues to make improvements all the wounds are still open. We only have one remaining wound on the left posterior calf. Using silver alginate to all open areas. He is on doxycycline which I started a week ago and should be finishing I gave him Augmentin after Thursday's visit for the severe cellulitis on the left medial thigh which fortunately looks better 07/14/17; the patient's left medial thigh cellulitis has resolved. The cellulitis in his right lower calf on the right also looks better. All of his wounds are stable to improved we've been using silver alginate he has completed the antibiotics I have given him. He has clindamycin 150 mg once a day prescribed by infectious disease for prophylaxis, I've advised him to start this now. We have been using bilateral Unna boots over silver alginate to  the wound areas 07/21/17; the patient is been to see infectious disease who noted his recurrent problems with cellulitis. He was not able to tolerate prophylactic clindamycin therefore he is on amoxicillin 500 twice a day. He also had a second daily dose of Lasix added By Dr. Oneta Rack but he is not taking this. Nor is he being completely compliant with his compression pumps a especially not this week. He has 2 remaining wounds one on the right posterior lateral lower leg and one on the left posterior medial lower leg. 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 The patient has Unna boots bilaterally. 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 He has bilateral Unna boots for compression and silver alginate to his wounds. Arrives today with his legs looking as good as I have seen him in quite some time. Not surprisingly his wounds look better as well with improvement on the right lateral leg venous insufficiency wound and also the left medial leg. He is still using the compression pumps once a day 08/11/17; both legs appear to be doing better wounds on the right lateral and left medial legs look better. Skin on the right leg quite good. He is been using silver alginate as the primary dressing. I'm going to use Anasept gel calcium alginate and maintain all the secondary dressings 08/18/17; the patient continues to actually do quite well. The area on his right lateral leg is just about closed the left medial also looks better although it is still moist in this area. His edema is well controlled we have been using Anasept gel with calcium alginate and the usual secondary dressings, 4 layer compression and once daily use of his compression pumps "always been able to manage 09/01/17; the  patient continues to do reasonably well in spite of his trip to [place]. The area on the right lateral leg is epithelialized. Left is much better but still open. He has more edema and more chronic erythema on  the left leg [venous inflammation] 09/08/17; he arrives today with no open wound on the right lateral leg and decently controlled edema. Unfortunately his left leg is not nearly as in his good situation as last week.he apparently had increasing edema starting on Saturday. He edema soaked through into his foot so used a plastic bag to walk around his home. The area on the medial right leg which was his open area is about the same however he has lost surface epithelium on the left lateral which is new and he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 back in the clinic with a much improved state. He only has small areas on the right lateral mid calf which is just about healed. This was is more substantial area for quite a prolonged period of time. He has a small open area on the left anterior tibia. The area on the posterior calf just below the popliteal fossa is closed today. He is using his compression pumps twice a day 08/28/2018; patient has no open wound on the right leg. He has a smattering of  open areas on the calf with some weeping lymphedema. More problematically than that it looks as though his wraps of slipped down in his usual he has very angry upper area of edema just below the right medial knee and on the right lateral calf. He has no open area on his feet. The patient is traveling to [facility] next week. I will send him in an antibiotic. We will continue to wrap the right leg. We ordered extremitease stockings for him last week and I plan to transition the right leg to a stocking when he gets home which will be in 10 days time. As usual he is very reluctant to take his pumps with him when he travels 09/07/2018; patient returns from [facility]. He shows me a picture of his left leg in the mid part of his trip last week with intense fire engine erythema. The picture look bad enough I would have considered sending him to the hospital. Instead he went to the wound care center in [facility]. They did not prescribe him antibiotics but he did take some doxycycline he had leftover from a previous visit. I had given him trimethoprim sulfamethoxazole before he left this did not work according to the patient. This is resulted in some improvement fortunately. He comes back with a large wound on the left posterior calf. Smaller area on the left anterior tibia. Denuded blisters on the dorsal left foot over his toes. Does not have much in the way of wounds on the right leg although he does have a very tender area on the right posterior area just below the popliteal fossa also suggestive of infection. He promises me he is back on his pumps twice a day 09/15/2018; the intense cellulitis in his left lower calf is a lot better. The wound area on the posterior left calf is also so better. However he has reasonably extensive wounds on the dorsal aspect of his second and third toes and the proximal foot just at the base of the toes. There is nothing open on the right leg 09/22/2018; the  patient has excellent edema control in his legs bilaterally. He is using his external compression pumps twice a day. He has no open area on the right leg and only the areas in the left foot dorsally second and third toe area on the left side. He does not have any signs of active cellulitis. 10/06/2018; the patient has good edema control bilaterally. He has no open wound on the right leg. There is a blister in the posterior aspect of his left calf that we had to deal with today. He is using his compression pumps twice a day. There is no signs of active cellulitis. We have been using silver alginate to the wound areas. He still has vulnerable areas on the base of his left first second toes dorsally He has a his extremities stockings and we are going to transition him today into the stocking on the right leg. He is cautioned that he will need to continue to use the compression pumps twice a day. If he notices uncontrolled edema in the right leg he may need to go to 3 times a day. 10/13/2018; the patient came in for a nurse check on Friday he has a large flaccid blister on the right medial calf just below the knee. We unroofed this. He has this and a new area underneath the posterior mid calf which was undoubtedly a blister as well. He also has several small areas on the right which is the area we put his extremities stocking on. 10/19/2018; the patient went to see infectious disease this morning I am not sure if that was a routine follow-up in any case the doxycycline I had given him was discontinued and started on linezolid. He has not started this. It is easy to look at his left calf and the inflammation and think this is cellulitis however he is very tender in the tissue just below the popliteal fossa and I have no doubt that there is infection going on here. He states the problem he is having is that with the compression pumps the edema goes down and then starts walking the wrap falls down. We will see  if we can adhere this. He has 1 or 2 minuscule open areas on the right still areas that are weeping on the posterior left calf, the base of his left second and third toes 10/26/18; back today in clinic with quite of skin breakdown in his left anterior leg. This may have been infection the area below the popliteal fossa seems a lot better however tremendous epithelial loss on the left anterior mid tibia area over quite inexpensive tissue. He has 2 blisters on the right side but no other open wound here. 10/29/2018; came in urgently to see [place] today and we worked him in for review. He states that the 4 layer compression on the right leg caused pain he had to cut it down to roughly his mid calf this caused swelling above the wrap and he has blisters and skin breakdown today. As a result of the pain he has not been using his pumps. Both legs are a lot more edematous and there is a lot of weeping fluid. 11/02/18; arrives in clinic with continued difficulties in the right leg> left. Leg is swollen and painful. multiple skin blisters and new open areas especially laterally. He has not been using his pumps on the right leg. 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 He is not systemically unwell. 11/09/2018; the  patient claims he is being compliant with his pumps. He is finished the doxycycline I gave him last week. Culture I did of the wound on the right lateral leg showed a few very resistant methicillin staph aureus. This was resistant to doxycycline. Nevertheless he states the pain in the leg is a lot better which makes me wonder if the cultured organism was not really what was causing the problem nevertheless this is a very dangerous organism to be culturing out of any wound. His right leg is still a lot larger than the left. He is using an Radio broadcast assistant on this area, he blames a 4-layer compression for causing the original skin breakdown which I doubt is true  however I cannot talk him out of it. We have been using silver alginate to all of these areas which were initially blisters 11/16/2018; patient is being compliant with his external compression pumps at twice a day. Miraculously he arrives in clinic today with absolutely no open wounds. He has better edema control on the left where he has been using 4 layer compression versus wound of wounds on the right and I pointed this out to him. There is no inflammation in the skin in his lower legs which is also somewhat unusual for him. There is no open wounds on the dorsal left foot. He has extremitease stockings at home and I have asked him to bring these in next week. 11/25/18 patient's lower extremity on examination today on the left appears for the most part to be wound free. He does have an open wound on the lateral aspect of the right lower extremity but this is minimal compared to what I've seen in past. He does request that we go ahead and wrap the left leg as well even though there's nothing open just so hopefully it will not reopen in short order. 1/28; patient has superficial open wounds on the right lateral calf left anterior calf and left posterior calf. His edema control is adequate. 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 We have been using silver alginate as the primary dressing. He claims compliance with his compression pumps 2/4; patient has superficial open wounds on numerous areas of his left calf and again one on the left dorsal foot. The areas on the right lateral calf have healed. The cellulitis that I gave him doxycycline for last week is also resolved this was mostly on the left anterior calf just below the tibial tuberosity. His edema looks fairly well-controlled. He tells me he went to see his primary doctor today and had blood work ordered 2/11; once again he has several open areas on the left calf  left tibial area. Most of these are small and appear to have healthy granulation. He does not have anything open on the right. The edema and control in his thighs is pretty good which is usually a good indication he has been using his pumps as requested. 2/18; he continues to have several small areas on the left calf and left tibial area. Most of these are small healthy granulation. We put him in his stocking on the right leg last week and he arrives with a superficial open area over the right upper tibia and a fairly large area on the right lateral tibia in similar condition. His edema control actually does not look too bad, he claims to be using his compression pumps twice a day 2/25.  Continued small areas on the left calf and left tibial area. New areas especially on the right are identified just below the tibial tuberosity and on the right upper tibia itself. There are also areas of weeping edema fluid even without an obvious wound. He does not have a considerable degree of lymphedema but clearly there is more edema here than his skin can handle. He states he is using the pumps twice a day. We have an Unna boot on the right and 4 layer compression on the left. 3/3; he continues to have an area on the right lateral calf and right posterior calf just below the popliteal fossa. There is a fair amount of tenderness around the wound on the popliteal fossa but I did not see any evidence of cellulitis, could just be that the wrap came down and rubbed in this area. ooHe does not have an open area on the left leg however there is an area on the left dorsal foot at the base of the third toe ooWe have been using silver alginate to all wound areas 3/10; he did not have an open area on his left leg last time he was here a week ago. T oday he arrives with a horizontal wound just below the tibial tuberosity and an area on the left lateral calf. He has intense erythema and tenderness in this area. The area is on  the right lateral calf and right posterior calf better than last week. We have been using silver alginate as usual 3/18 - Patient returns with 3 small open areas on left calf, and 1 small open area on right calf, the skin looks ok with no significant erythema, he continues the UNA boot on right and 4 layer compression on left. The right lateral calf wound is closed , the right posterior is small area. we will continue silver alginate to the areas. 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 He is been tolerating the dressing changes without complication. Fortunately she has made excellent improvement in regard to the overall status of his wounds. Unfortunately every time we cease wrapping him he ends up reopening in causing more significant issues at that point. Again I'm unsure of the best direction to take although I think the lymphedema clinic may be appropriate for him. 02/03/19 on evaluation today patient appears to be doing well in regard to the wounds that we saw him for last week unfortunately he has a new area on the proximal portion of his right medial/posterior lower extremity where the wrap somewhat slowed down and caused swelling and a blister to rub and open. Unfortunately this is the only opening that he has on either leg at this point. 02/17/19 on evaluation today patient's bilateral lower extremities appear to be doing well. He still completely healed in regard to the left lower extremity. In regard to the right lower extremity the area where the wrap and slid down and caused the blister still seems to be slightly open although this is dramatically better than during the last evaluation two weeks ago. I'm very pleased with the way this stands overall. 03/03/19 on evaluation today patient appears  to be doing well in regard to his right lower extremity in general although he did have a new blister open this does not appear to be showing  any evidence of active infection at this time. Fortunately there's No fevers, chills, nausea, or vomiting noted at this time. Overall I feel like he is making good progress it does feel like that the right leg will we perform the [employer] seems to do with a bit better than three layer wrap on the left which slid down on him. We may switch to doing bilateral in the book wraps. 5/4; I have not seen Mr. Roemmich in quite some time. According to our case manager he did not have an open wound on his left leg last week. He had 1 remaining wound on the right posterior medial calf. He arrives today with multiple openings on the left leg probably were blisters and/or wrap injuries from Unna boots. I do not think the Unna boot's will provide adequate compression on the left. I am also not clear about the frequency he is using the compression pumps. 03/17/19 on evaluation today patient appears to be doing excellent in regard to his lower extremities compared to last week's evaluation apparently. He had gotten significantly worse last week which is unfortunate. The [employer] wrap on the left did not seem to do very well for him at all and in fact it didn't control his swelling significantly enough he had an additional outbreak. Subsequently we go back to the four layer compression wrap on the left. This is good news. At least in that he is doing better and the wound seem to be killing him. He still has not heard anything from the lymphedema clinic. 03/24/19 on evaluation today patient actually appears to be doing much better in regard to his bilateral lower Trinity as compared to last week when I saw him. Fortunately there's no signs of active infection at this time. He has been tolerating the dressing changes without complication. Overall I'm extremely pleased with the  progress and appearance in general. 04/07/19 on evaluation today patient appears to be doing well in regard to his bilateral lower extremities. His swelling is significantly down from where it was previous. With that being said he does have a couple blisters still open at this point but fortunately nothing that seems to be too severe and again the majority of the larger openings has healed at this time. 04/14/19 on evaluation today patient actually appears to be doing quite well in regard to his bilateral lower extremities in fact I'm not even sure there's anything significantly open at this time at any site. Nonetheless he did have some trouble with these wraps where they are somewhat irritating him secondary to the fact that he has noted that the graph wasn't too close down to the end of this foot in a little bit short as well up to his knee. Otherwise things seem to be doing quite well. 04/21/19 upon evaluation today patient's wound bed actually showed evidence of being completely healed in regard to both lower extremities which is excellent news. There does not appear to be any signs of active infection which is also good news. I'm very pleased in this regard. No fevers, chills, nausea, or vomiting noted at this time. 04/28/19 on evaluation today patient appears to be doing a little bit worse in regard to both lower extremities on the left mainly due to the fact that when he went infection disease the wrap was not wrapped quite high enough he developed a blister above this. On the right he is a small open area of nothing too significant but again this is  continuing to give him some trouble he has been were in the Velcro compression that he has at home. 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 He's been tolerating the dressing changes without complication. Fortunately there's no signs of active infection at this time. No fevers, chills,  nausea, or vomiting noted at this time. We have been trying to get an appointment with her lymphedema clinic in [facility] but unfortunately nobody can get them on phone with not been able to even fax information over the patient likewise is not been able to get in touch with them. Overall I'm not sure exactly what's going on here with to reach out again today. 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 Still having a lot of drainage unfortunately. He tells me especially in the left but even on the right. There's no signs of active infection which is good news we've been using so ratcheted up to this point. 05/19/19 on evaluation today patient actually appears to be doing quite well with regard to his left lower extremity which is great news. Fortunately in regard to the right lower extremity has an issues with his wrap and he subsequently did remove this from what I'm understanding. Nonetheless long story short is what he had rewrapped once he removed it subsequently had maggots underneath this wrap whenever he came in for evaluation today. With that being said they were obviously completely cleaned away by the nursing staff. The visit today which is excellent news. However he does appear to potentially have some infection around the right ankle region where the maggots were located as well. He will likely require anabiotic therapy today. 05/26/19 on evaluation today patient actually appears to be doing much better in regard to his bilateral lower extremities. I feel like the infection is under much better control. With that being said there were maggots noted when the wrap was removed yet again today. Again this could have potentially been left over from previous although at this time there does not appear to be any signs of significant drainage there was obviously on the wrap some drainage as well  this contracted gnats or otherwise. Either way I do not see anything that appears to be doing worse in my pinion and in fact I think his drainage has slowed down quite significantly likely mainly due to the fact to his infection being under better control. 06/02/2019 on evaluation today patient actually appears to be doing well with regard to his bilateral lower extremities there is no signs of active infection at this time which is great news. With that being said he does have several open areas more so on the right than the left but nonetheless these are all significantly better than previously noted. 06/09/2019 on evaluation today patient actually appears to be doing well. His wrap stayed up and he did not cause any problems he had more drainage on the right compared to the left but overall I do not see any major issues at this time which is great news. 06/16/2019 on evaluation today patient appears to be doing excellent with regard to his lower extremities the only area that is open is a new blister that can have opened as of today on the medial ankle on the left. Other than this he really seems to be doing great I see no major issues at this  point. 06/23/2019 on evaluation today patient appears to be doing quite well with regard to his bilateral lower extremities. In fact he actually appears to be almost completely healed there is a small area of weeping noted of the right lower extremity just above the ankle. Nonetheless fortunately there is no signs of active infection at this time which is good news. No fevers, chills, nausea, vomiting, or diarrhea. 8/24; the patient arrived for a nurse visit today but complained of very significant pain in the left leg and therefore I was asked to look at this. Noted that he did not have an open area on the left leg last week nevertheless this was wrapped. The patient states that he is not been able to put his compression pumps on the left leg because of the  discomfort. He has not been systemically unwell 06/30/2019 on evaluation today patient unfortunately despite being excellent last week is doing much worse with regard to his left lower extremity today. In fact he had to come in for a nurse on Monday where his left leg had to be rewrapped due to excessive weeping Dr. Leanord Hawking placed him on doxycycline at that point. Fortunately there is no signs of active infection Systemically at this time which is good news. 07/07/2019 in regard to the patient's wounds today he actually seems to be doing well with his right lower extremity there really is nothing open or draining at this point this is great news. Unfortunately the left lower extremity is given him additional trouble at this time. There does not appear to be any signs of active infection nonetheless he does have a lot of edema and swelling noted at this point as well as blistering all of which has led to a much more poor appearing leg at this time compared to where it was 2 weeks ago when it was almost completely healed. Obviously this is a little discouraging for the patient. He is try to contact the lymphedema clinic in [place] he has not been able to get through to them. 07/14/2019 on evaluation today patient actually appears to be doing slightly better with regard to his left lower extremity ulcers. Overall I do feel like at least at the top of the wrap that we have been placing this area has healed quite nicely and looks much better. The remainder of the leg is showing signs of improvement. Unfortunately in the thigh area he still has an open region on the left and again on the right he has been utilizing just a Band-Aid on an area that also opened on the thigh. Again this is an area that were not able to wrap although we did do an Ace wrap to provide some compression that something that obviously is a little less effective than the compression wraps we have been using on the lower portion of the  leg. He does have an appointment with the lymphedema clinic in [facility] on Friday. 07/21/2019 on evaluation today patient appears to be doing better with regard to his lower extremity ulcers. He has been tolerating the dressing changes without complication. Fortunately there is no signs of active infection at this time. No fevers, chills, nausea, vomiting, or diarrhea. I did receive the paperwork from the physical therapist at the lymphedema clinic in [place]. Subsequently I signed off on that this morning and sent that back to him for further progression with the treatment plan. 07/28/2019 on evaluation today patient appears to be doing very well with regard to his right  lower extremity where I do not see any open wounds at this point. Fortunately he is feeling great as far as that is concerned as well. In regard to the left lower extremity he has been having issues with still several areas of weeping and edema although the upper leg is doing better his lower leg still I think is going require the compression wrap at this time. No fevers, chills, nausea, vomiting, or diarrhea. 08/04/2019 on evaluation today patient unfortunately is having new wounds on the right lower extremity. Again we have been using Unna boot wrap on that side. We switched him to using his juxta lite wrap at home. With that being said he tells me he has been using it although his legs extremely swollen and to be honest really does not appear that he has been. I cannot know that for sure however. Nonetheless he has multiple new wounds on the right lower extremity at this time. Obviously we will have to see about getting this rewrapped for him today. 08/11/2019 on evaluation today patient appears to be doing fairly well with regard to his wounds. He has been tolerating the dressing changes including the compression wraps without complication. He still has a lot of edema in his upper thigh regions bilaterally he is supposed  to be seeing the lymphedema clinic on the 15th of this month once his wraps arrive for the upper part of his legs. 08/18/2019 on evaluation today patient appears to be doing well with regard to his bilateral lower extremities at this point. He has been tolerating the dressing changes without complication. Fortunately there is no signs of active infection which is also good news. He does have a couple weeping areas on the first and second toe of the right foot he also has just a small area on the left foot upper leg and a small area on the left lower leg but overall he is doing quite well in my opinion. He is supposed to be getting his wraps shortly in fact tomorrow and then subsequently is seeing the lymphedema clinic next Wednesday on the 21st. Of note he is also leaving on the 25th to go on vacation for a week to the beach. For that reason and since there is some uncertainty about what there can be doing at lymphedema clinic next Wednesday I am get a make an appointment for next Friday here for [place] to see what we need to do for him prior to him leaving for vacation. 10/23; patient arrives in considerable pain predominantly in the upper posterior calf just distal to the popliteal fossa also in the wound anteriorly above the major wound. This is probably cellulitis and he has had this recurrently in the past. He has no open wound on the right side and he has had an Radio broadcast assistant in that area. Finally I note that he has an area on the left posterior calf which by enlarge is mostly epithelialized. This protrudes beyond the borders of the surrounding skin in the setting of dry scaly skin and lymphedema. The patient is leaving for [facility] on Sunday. Per his longstanding pattern, he will not take his compression pumps with him predominantly out of fear that they will be stolen. He therefore asked that we put a Unna boot back on the right leg. He will also contact the wound care center in [facility] to see  if they can change his dressing in the mid week. 11/3; patient returned from his vacation to [facility]  [place]. He was seen on 1 occasion at their wound care center. They did a 2 layer compression system as they did not have our 4-layer wrap. I am not completely certain what they put on the wounds. They did not change the Unna boot on the right. The patient is also seeing a lymphedema specialist physical therapist in [place]. It appears that he has some compression sleeve for his thighs which indeed look quite a bit better than I am used to seeing. He pumps over these with his external compression pumps. 11/10; the patient has a new wound on the right medial thigh otherwise there is no open areas on the right. He has an area on the left leg posteriorly anteriorly and medially and an area over the left second toe. We have been using silver alginate. He thinks the injury on his thigh is secondary to friction from the compression sleeve he has. 11/17; the patient has a new wound on the right medial thigh last week. He thinks this is because he did not have a underlying stocking for his thigh juxta lite apparatus. He now has this. The area is fairly large and somewhat angry but I do not think he has underlying cellulitis. ooHe has a intact blister on the right anterior tibial area. ooSmall wound on the right great toe dorsally ooSmall area on the medial left calf. 11/30; the patient does not have any open areas on his right leg and we did not take his juxta lite stocking off. However he states that on Friday his compression wrap fell down lodging around his upper mid calf area. As usual this creates a lot of problems for him. He called urgently today to be seen for a nurse visit however the nurse visit turned into a provider visit because of extreme erythema and pain in the left anterior tibia extending laterally and posteriorly. The area that is problematic is extensive 10/06/2019 upon evaluation  today patient actually appears to be doing poorly in regard to his left lower extremity. He Dr. Leanord Hawking did place him on doxycycline this past Monday apparently due to the fact that he was doing much worse in regard to this left leg. Fortunately the doxycycline does seem to be helping. Unfortunately we are still having a very difficult time getting his edema under any type of control in order to anticipate discharge at some point. The only way were really able to control his lymphedema really is with compression wraps and that has only even seemingly temporary. He has been seeing a lymphedema clinic they are trying to help in this regard but still this has been somewhat frustrating in general for the patient. 10/13/19 on evaluation today patient appears to be doing excellent with regard to his right lower extremity as far as the wounds are concerned. His swelling is still quite extensive unfortunately. He is still having a lot of drainage from the thigh areas bilaterally which is unfortunate. He's been going to lymphedema clinic but again he still really does not have this edema under control as far as his lower extremities are concern. With regard to his left lower extremity this seems to be improving and I do believe the doxycycline has been of benefit for him. He is about to complete the doxycycline. 10/20/2019 on evaluation today patient appears to be doing poorly in regard to his bilateral lower extremities. More in the right thigh he has a lot of irritation at this site unfortunately. In regard to the left lower  extremity the wrap was not quite as high it appears and does seem to have caused him some trouble as well. Fortunately there is no evidence of systemic infection though he does have some blue-green drainage which has me concerned for the possibility of Pseudomonas. He tells me he is previously taking Cipro without complications and he really does not care for Levaquin however due to some of  the side effects he has. He is not allergic to any medications specifically antibiotics that were aware of. 10/27/2019 on evaluation today patient actually does appear to be for the most part doing better when compared to last week's evaluation. With that being said he still has multiple open wounds over the bilateral lower extremities. He actually forgot to start taking the Cipro and states that he still has the whole bottle. He does have several new blisters on left lower extremity today I think I would recommend he go ahead and take the Cipro based on what I am seeing at this point. 12/30-Patient comes at 1 week visit, 4 layer compression wraps on the left and Unna boot on the right, primary dressing Xtrasorb and silver alginate. Patient is taking his Cipro and has a few more days left probably 5-6, and the legs are doing better. He states he is using his compressions devices which I believe he has 11/10/2019 on evaluation today patient actually appears to be much better than last time I saw him 2 weeks ago. His wounds are significantly improved and overall I am very pleased in this regard. Fortunately there is no signs of active infection at this time. He is just a couple of days away from completing Cipro. Overall his edema is much better he has been using his lymphedema pumps which I think is also helping at this point. 11/17/2019 on evaluation today patient appears to be doing excellent in regard to his wounds in general. His legs are swollen but not nearly as much as they have been in the past. Fortunately he is tolerating the compression wraps without complication. No fevers, chills, nausea, vomiting, or diarrhea. He does have some erythema however in the distal portion of his right lower extremity specifically around the forefoot and toes there is a little bit of warmth here as well. 11/24/2019 on evaluation today patient appears to be doing well with regard to his right lower extremity I really  do not see any open wounds at this point. His left lower extremity does have several open areas and his right medial thigh also is open. Other than this however overall the patient seems to be making good progress and I am very pleased at this point. 12/01/2019 on evaluation today patient appears to be doing poorly at this point in regard to his left lower extremity has several new blisters despite the fact that we have him in compression wraps. In fact he had a 4-layer compression wrap, his upper thigh wrapped from lymphedema clinic, and a juxta light over top of the 4 layer compression wrap the lymphedema clinic applied and despite all this he still develop blisters underneath. Obviously this does have me concerned about the fact that unfortunately despite what we are doing to try to get wounds healed he continues to have new areas arise I do not think he is ever good to be at the point where he can realistically just use wraps at home to keep things under control. Typically when we heal him it takes about 1-2 days before he is back  in the clinic with severe breakdown and blistering of his lower extremities bilaterally. This is happened numerous times in the past. Unfortunately I think that we may need some help as far as overall fluid overload to kind of limit what we are seeing and get things under better control. 12/08/2019 on evaluation today patient presents for follow-up concerning his ongoing bilateral lower extremity edema. Unfortunately he is still having quite a bit of swelling the compression wraps are controlling this to some degree but he did see Dr. [name] his cardiologist I do have that available for review today as far as the appointment was concerned that was on 12/06/2019. Obviously that she has been 2 days ago. The patient states that he is only been taking the Lasix 80 mg 1 time a day he had told me previously he was taking this twice a day. Nonetheless Dr. [name] recommended this be up  to 80 mg 2 times a day for the patient as he did appear to be fluid overloaded. With that being said the patient states he did this yesterday and he was unable to go anywhere or do anything due to the fact that he was constantly having to urinate. Nonetheless I think that this is still good to be something that is important for him as far as trying to get his edema under control at all things that he is going to be able to just expect his wounds to get under control and things to be better without going through at least a period of time where he is trying to stabilize his fluid management in general and I think increasing the Lasix is likely the first step here. It was also mentioned the possibility that the patient may require metolazone. With that being said he wanted to have the patient take Lasix twice a day first and then reevaluating 2 months to see where things stand. 12/15/2019 upon evaluation today patient appears to be doing regard to his legs although his toes are showing some signs of weeping especially on the left at this point to some degree on the right. There does not appear to be any signs of active infection and overall I do feel like the compression wraps are doing well for him but he has not been able to take the Lasix at home and the increased dose that Dr. [name] recommended. He tells me that just not go to be feasible for him. Nonetheless I think in this case he should probably send a message to Dr. [name] in order to discuss options from the standpoint of possible admission to get the fluid off or otherwise going forward. 12/22/2019 upon evaluation today patient appears to be doing fairly well with regard to his lower extremities at this point. In fact he would be doing excellent if it was not for the fact that his right anterior thigh apparently had an allergic reaction to adhesive tape that he used. The wound itself that we have been monitoring actually appears to be healed. There  is a lot of irritation at this point. 12/29/2019 upon evaluation today patient appears to be doing well in regard to his lower extremities. His left medial thigh is open and somewhat draining today but this is the only region that is open the right has done much better with the treatment utilizing the steroid cream that I prescribed for him last week. Overall I am pleased in that regard. Fortunately there is no signs of active infection at this time. No  fevers, chills, nausea, vomiting, or diarrhea. 01/05/2020 upon evaluation today patient appears to be doing more poorly in regard to his right lower extremity at this point upon evaluation today. Unfortunately he continues to have issues in this regard and I think the biggest issue is controlling his edema. This obviously is not very well controlled at this point is been recommended that he use the Lasix twice a day but he has not been able to do that. Unfortunately I think this is leading to an issue where honestly he is not really able to effectively control his edema and therefore the wounds really are not doing significantly better. I do not think that he is going to be able to keep things under good control unless he is able to control his edema much better. I discussed this again in great detail with him today. 01/12/2020 good news is patient actually appears to be doing quite well today at this point. He does have an appointment with lymphedema clinic tomorrow. His legs appear healed and the toe on the left is almost completely healed. In general I am very pleased with how things stand at this point. 01/19/2020 upon evaluation today patient appears to actually be doing well in regard to his lower extremities there is nothing open at this point. Fortunately he has done extremely well more recently. Has been seeing lymphedema clinic as well. With that being said he has Velcro wraps for his lower legs as well as his upper legs. The only wound really is on  his toe which is the right great toe and this is barely anything even there. With all that being said I think it is good to be appropriate today to go ahead and switch him over to the Velcro compression wraps. 01/26/2020 upon evaluation today patient appears to be doing worse with regard to his lower extremities after last week switch him to Velcro compression wraps. Unfortunately he lasted less than 24 hours he did not have the sock portion of his Velcro wrap on the left leg and subsequently developed a blister underneath the Velcro portion. Obviously this is not good and not what we were looking for at this point. He states the lymphedema clinic did tell him to wear the wrap for 23 hours and take him off for 1 I am okay with that plan but again right now we got a get things back under control again he may have some cellulitis noted as well. 02/02/2020 upon evaluation today patient unfortunately appears to have several areas of blistering on his bilateral lower extremities today mainly on the feet. His legs do seem to be doing somewhat better which is good news. Fortunately there is no evidence of active infection at this time. No fevers, chills, nausea, vomiting, or diarrhea. 02/16/2020 upon evaluation today patient appears to be doing well at this time with regard to his legs. He has a couple weeping areas on his toes but for the most part everything is doing better and does appear to be sealed up on his legs which is excellent news. We can continue with wrapping him at this point as he had every time we discontinue the wraps he just breaks out with new wounds. There is really no point in is going forward with this at this point. 03/08/2020 upon evaluation today patient actually appears to be doing quite well with regard to his lower extremity ulcers. He has just a very superficial and really almost nonexistent blister on the left lower  extremity he has in general done very well with the compression  wraps. With that being said I do not see any signs of infection at this time which is good news. 03/29/2020 upon evaluation today patient appears to be doing well with regard to his wounds currently except for where he had several new areas that opened up due to some of the wrap slipping and causing him trouble. He states he did not realize they had slipped. Nonetheless he has a 1 area on the right and 3 new areas on the left. Fortunately there is no signs of active infection at this time which is great news. 04/05/2020 upon evaluation today patient actually appears to be doing quite well in general in regard to his legs currently. Fortunately there is no signs of active infection at this time. No fevers, chills, nausea, vomiting, or diarrhea. He tells me next week that he will actually be seen in the lymphedema clinic on Thursday at 10 AM I see him on Wednesday next week. 04/12/2020 upon evaluation today patient appears to be doing very well with regard to his lower extremities bilaterally. In fact he does not appear to have any open wounds at this point which is good news. Fortunately there is no signs of active infection at this time. No fevers, chills, nausea, vomiting, or diarrhea. 04/19/2020 upon evaluation today patient appears to be doing well with regard to his wounds currently on the bilateral lower extremities. There does not appear to be any signs of active infection at this time. Fortunately there is no evidence of systemic infection and overall very pleased at this point. Nonetheless after I held him out last week he literally had blisters the next morning already which swelled up with him being right back here in the clinic. Overall I think that he is just not can be able to be discharged with his legs the way they are he is much to volume overloaded as far as fluid is concerned and that was discussed with him today of also discussed this but should try the clinic nurse manager as well as Dr.  Leanord Hawking. 04/26/2020 upon evaluation today patient appears to be doing better with regard to his wounds currently. He is making some progress and overall swelling is under good control with the compression wraps. Fortunately there is no evidence of active infection at this time. 05/10/2020 on evaluation today patient appears to be doing overall well in regard to his lower extremities bilaterally. He is Tolerating the compression wraps without complication and with what we are seeing currently I feel like that he is making excellent progress. There is no signs of active infection at this time. 05/24/2020 upon evaluation today patient appears to be doing well in regard to his legs. The swelling is actually quite a bit down compared to where it has been in the past. Fortunately there is no sign of active infection at this time which is also good news. With that being said he does have several wounds on his toes that have opened up at this point. 05/31/2020 upon evaluation today patient appears to be doing well with regard to his legs bilaterally where he really has no significant fluid buildup at this point overall he seems to be doing quite well. Very pleased in this regard. With regard to his toes these also seem to be drying up which is excellent. We have continue to wrap him as every time we tried as a transition to the juxta light wraps  things just do not seem to get any better. 06/07/2020 upon evaluation today patient appears to be doing well with regard to his right leg at this point. Unfortunately left leg has a lot of blistering he tells me the wrap started to slide down on him when he tried to put his other Velcro wrap over top of it to help keep things in order but nonetheless still had some issues. 06/14/2020 on evaluation today patient appears to be doing well with regard to his lower extremity ulcers and foot ulcers at this point. I feel like everything is actually showing signs of improvement which  is great news overall there is no signs of active infection at this time. No fevers, chills, nausea, vomiting, or diarrhea. 06/21/2020 on evaluation today patient actually appears to be doing okay in regard to his wounds in general. With that being said the biggest issue I see is on his right foot in particular the first and second toe seem to be doing a little worse due to the fact this is staying very wet. I think he is probably getting need to change out his dressings a couple times in between each week when we see him in regard to his toes in order to keep this drier based on the location and how this is proceeding. 06/28/2020 on evaluation today patient appears to be doing a little bit more poorly overall in regard to the appearance of the skin I am actually somewhat concerned about the possibility of him having a little bit of an infection here. We discussed the course of potentially giving him a doxycycline prescription which he is taken previously with good result. With that being said I do believe that this is potentially mild and at this point easily fixed. I just do not want anything to get any worse. 07/12/2020 upon evaluation today patient actually appears to be making some progress with regard to his legs which is great news there does not appear to be any evidence of active infection. Overall very pleased with where things stand. 07/26/2020 upon evaluation today patient appears to be doing well with regard to his leg ulcers and toe ulcers at this point. He has been tolerating the compression wraps without complication overall very pleased in this regard. 08/09/2020 upon evaluation today patient appears to be doing well with regard to his lower extremities bilaterally. Fortunately there is no signs of active infection overall I am pleased with where things stand. 08/23/2020 on evaluation today patient appears to be doing well with regard to his wound. He has been tolerating the dressing  changes without complication. Fortunately there is no signs of active infection at this time. Overall his legs seem to be doing quite well which is great news and I am very pleased in that regard. No fevers, chills, nausea, vomiting, or diarrhea. 09/13/2020 upon evaluation today patient appears to be doing okay in regard to his lower extremities. He does have a fairly large blister on the right leg which I did remove the blister tissue from today so we can get this to dry out other than that however he seems to be doing quite well. There is no signs of active infection at this time. 09/27/2020 upon evaluation today patient appears to actually be doing some better in regard to his right leg. Fortunately signs of active infection at this time which is great news. No fevers, chills, nausea, vomiting, or diarrhea. 10/04/2020 upon evaluation today patient actually appears to be showing signs  of improvement which is great news with regard to his leg ulcers. Fortunately there is no signs of active infection which is great news he is still taking the antibiotics currently. No fevers, chills, nausea, vomiting, or diarrhea. 10/18/2020 on evaluation today patient appears to be doing well with regard to his legs currently. He has been tolerating the dressing changes including the wraps without complication. Fortunately there is no signs of active infection at this time. No fevers, chills, nausea, vomiting, or diarrhea. 10/25/2020 upon evaluation today patient appears to be doing decently well in regard to his wounds currently. He has been tolerating the dressing changes without complication. Overall I feel like he is making good progress albeit slow. Again this is something we can have to continue to wrap for some time to come most likely. 11/08/2020 upon evaluation today patient appears to be doing well with regard to his wounds currently. He has been tolerating the dressing changes without complication is not  currently on any antibiotics and he does not appear to show any signs of infection. He does continue to have a lot of drainage on the right leg not too severe but nonetheless this is very scattered. On the left leg this is looking to be much improved overall. 11/15/2020 upon evaluation today patient appears to be doing better with regard to his legs bilaterally. Especially the right leg which was much more significant last week. There does not appear to be any signs of active infection which is great news. No fevers, chills, nausea, vomiting, or diarrhea. 11/23/2019 upon evaluation today patient appears to be doing poorly still in regard to his lower extremities bilaterally. Unfortunately his right leg in particular appears to be doing much more poorly there is no signs really of infection this is not warm to touch but he does have a lot of drainage and weeping unfortunately. With that reason I do believe that we may need to initiate some treatment here to try to help calm down some of the swelling of the right leg. I think switching to a 4-layer compression wrap would be beneficial here. The patient is in agreement with giving this a try. 11/29/2020 upon evaluation today patient appears to be doing well currently in regard to his leg ulcers. I feel like the right leg is doing better he still has a lot of drainage but we do see some improvement here. The 4-layer compression wrap I think was helpful. 12/06/2020 upon evaluation today patient appears to be doing well with regard to his legs. In fact they seem to be doing about the best I have seen up to this point. Fortunately there is no signs of active infection at this time. No fevers, chills, nausea, vomiting, or diarrhea. 12/20/2020 upon evaluation today patient appears to be doing well at this time in regard to his legs. He is not having any significant draining which is great news. Fortunately there is no signs of active infection at this time. No fevers,  chills, nausea, vomiting, or diarrhea. 01/17/2021 upon evaluation today Manny actually appears to be doing excellent in regard to his legs. He has a few areas again that come and go as far as his toes are concerned but overall this is doing quite well. 01/31/2021 upon evaluation today patient appears to be doing well with regard to his legs. Fortunately there does not appear to be any signs of active infection which is great news. Overall he is still having significant edema despite the compression wraps basically  the 4-layer compression wrap to just keep things under control there is really not much room for play. 4/13: Mr. Ashworth is a longstanding patient in our clinic and benefits greatly from weekly compression wraps. Today he has no complaints. He has been tolerating the wraps well. He states he is using the lymphedema pumps at home. 5/4; patient presents for follow-up of his chronic lymphedema/venous insufficiency ulcers. He comes weekly for compression wraps. He has no complaints today. He was unable to tolerate the Coflex 2 layer Last week so we will do the four press 4-layer compression. He has been using his lymphedema pumps daily. 5/18; patient presents for 2-week follow-up. He has no complaints or issues today. He has developed a new wound to the right foot on his fourth toe. He overall feels well and denies signs of infection. 6/1; patient presents for 2-week follow-up. He has no complaints or issues today. He denies signs of infection. 04/18/2021 upon evaluation today patient appears to be doing well with regard to his legs bilaterally. Family open wound is actually on the toe of his left foot everything else is completely closed which is great news. In general I am extremely pleased with where things stand at this point. The patient is also happy that things are doing so well. 05/02/2021 upon evaluation today patient's legs actually appear to be doing quite well today. Fortunately there  does not appear to be any signs of active infection which is great and overall I am extremely pleased with where he stands today. The patient does not appear to have any evidence of active infection at this time which is also great news. Objective Constitutional Well-nourished and well-hydrated in no acute distress. Vitals Time Taken: 10:52 AM, Height: 70 in, Source: Stated, Weight: 380.2 lbs, Source: Stated, BMI: 54.5, Temperature: 98 F, Pulse: 73 bpm, Respiratory Rate: 20 breaths/min, Blood Pressure: 145/68 mmHg, Capillary Blood Glucose: 160 mg/dl. General Notes: glucose per pt report this am Respiratory normal breathing without difficulty. Psychiatric this patient is able to make decisions and demonstrates good insight into disease process. Alert and Oriented x 3. pleasant and cooperative. General Notes: Patient's wounds currently appear to be mostly healed as far as the legs are concerned. I am extremely pleased with where we stand at this point and overall I think that the patient is making excellent progress. He does not seem to show any signs whatsoever of active infection at this time which is also great news in fact with the compression wraps in place has been quite a while since he actually became infected. Integumentary (Hair, Skin) Wound #193 status is Open. Original cause of wound was Gradually Appeared. The date acquired was: 01/31/2021. The wound has been in treatment 13 weeks. The wound is located on the Left T Second. The wound measures 0.1cm length x 0.1cm width x 0.1cm depth; 0.008cm^2 area and 0.001cm^3 volume. There oe is no tunneling or undermining noted. There is a none present amount of drainage noted. The wound margin is distinct with the outline attached to the wound base. There is no granulation within the wound bed. There is no necrotic tissue within the wound bed. 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 Post procedure Diagnosis Wound #: Same as Pre-Procedure There was a Four Layer Compression Therapy Procedure by Rolan Lipa, RN. Post procedure Diagnosis Wound #: Same as Pre-Procedure Plan Follow-up Appointments: Return Appointment in 2 weeks. - with Leonard Schwartz Nurse Visit: - 1 week (Wed) Bathing/ Shower/ Hygiene: May shower with protection but do not get wound dressing(s) wet. Edema Control - Lymphedema / SCD / Other: Lymphedema Pumps. Use Lymphedema pumps on leg(s) 2-3 times a day for 45-60 minutes. If wearing any wraps or hose, do not remove them. Continue exercising as instructed. Elevate legs to the level of the heart or above for 30 minutes daily and/or when sitting, a frequency of: - throughout the day Avoid standing for long periods of time. Exercise regularly Non Wound Condition: Other Non Wound Condition Orders/Instructions: - lotion to both legs, 4 layer compression wraps both legs WOUND #193: - T Second Wound Laterality: Left oe Prim Dressing: KerraCel Ag Gelling Fiber Dressing, 2x2 in (silver alginate) Every Other Day/15 Days ary Discharge Instructions: Apply silver alginate to wound bed as instructed Secondary Dressing: Woven Gauze Sponges 2x2 in Every Other Day/15 Days Discharge Instructions: Apply over primary dressing as directed. Secured With: Insurance underwriter, Sterile 2x75 (in/in) Every Other Day/15 Days Discharge Instructions: Secure with  stretch gauze as directed. 1. Would recommend currently that we going continue with the wound care measures as before and the patient is in agreement with plan. This includes the use of the the 4-layer compression wrap. I think this is still the best way to go at this point. This is bilateral. 2. I am going to recommend that we use silver alginate over any open areas. 3. I am going to recommend patient continue to elevate his legs much as possible he should also be taking the fluid pills. I think that is also beneficial. We will see patient back for reevaluation in 1 week here in the clinic. If anything worsens or changes patient will contact our office for additional recommendations. Electronic Signature(s) Signed: 05/02/2021 12:00:57 PM By: Lenda Kelp PA-C Entered By: Lenda Kelp on 05/02/2021 12:00:57 -------------------------------------------------------------------------------- SuperBill Details Patient Name: Date of Service: [name], [name] 05/02/2021 Medical Record Number: [id number] Patient Account Number: [id number] Date of Birth/Sex: Treating RN: 25-Feb-1951 (70 y.o. [name] Primary Care Provider: Nicoletta Ba Other Clinician: Referring Provider: Treating Provider/Extender: Adele Dan in Treatment: 275 Diagnosis Coding ICD-10 Codes Code Description [phone] Non-pressure chronic ulcer of other 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 of skin L97.829 Non-pressure chronic ulcer of other part of left lower leg with unspecified severity L97.519 Non-pressure chronic ulcer of other part of right foot with unspecified severity I87.333 Chronic venous hypertension (idiopathic) with ulcer and inflammation of bilateral lower extremity I89.0 Lymphedema, not elsewhere classified E11.622 Type 2 diabetes mellitus with other skin ulcer E11.40 Type 2 diabetes mellitus with diabetic neuropathy, unspecified Facility Procedures CPT4: Code [id number]  foo Description: 81 BILATERAL: Application of multi-layer venous compression system; leg (below knee), including ankle and t. Modifier: Quantity: 1 Physician Procedures : CPT4 Code Description Modifier [id number] - WC PHYS LEVEL 3 - EST PT ICD-10 Diagnosis Description L97.521 Non-pressure chronic ulcer of other 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 of skin L97.829 Non-pressure chronic ulcer of other part of left  lower leg with unspecified severity L97.519 Non-pressure chronic ulcer of other part of right foot with unspecified severity I87.333 Chronic venous hypertension (idiopathic) with ulcer and inflammation of bilateral lower extremity Quantity: 1 Electronic Signature(s) Signed: 05/02/2021 12:01:17 PM By: Lenda KelpStone III, Quincy Prisco PA-C Entered By: Lenda KelpStone III, Martin Smeal on 05/02/2021 12:01:13

## 2021-05-02 NOTE — Telephone Encounter (Signed)
Agree--stop spironolactone, monitor bp. I have nothing left to offer regarding blood pressure control so I'll have to refer him to cardiology to see if they can help. If pt agreeable to referral pls order cardiology referral, dx uncontrolled hypertension.-thx

## 2021-05-02 NOTE — Telephone Encounter (Signed)
Noted  

## 2021-05-02 NOTE — Patient Instructions (Signed)
Take spironolactone 25mg  tab if your blood pressure is consistently >150 on top or >90 on bottom. If your blood pressure is <150/90 then you don't have to take spironolactone.

## 2021-05-02 NOTE — Telephone Encounter (Signed)
LM for pt to return call to discuss.  

## 2021-05-02 NOTE — Progress Notes (Signed)
OFFICE VISIT  05/02/2021  CC:  Chief Complaint  Patient presents with   Follow-up    hypertension    HPI:    Patient is a 70 y.o. Caucasian male who presents for 5d f/u uncontrolled HTN. At last visit I added spironolactone 25mg  qd. BP's at home when taking that med for 3d were140/57, 184/84, and 160/63 and states he felt well, no side effects. The 4th day he describes getting up very early after only getting about 2 hrs of sleep (anxiety) and having to drive his brother to [facility] for MD appt, it was hot and was a long day, got back to his apartment and felt quite tired and some lightheaded.  No vertigo, no presyncope, no SOB, no CP, no nausea.  He had not yet taken his spironolactone that day and he checked his bp when feeling these sx's and it was 116/52.   No HR data.  He took a nap and felt better.  He then ate supper and last night slept very well.  Got up this morning feeling well, no prob with dizziness. He did not take the spironolactone yesterday or today.  BP at wound care clinic this morning was normal and today here bp is 123/56, HR 66.  He describes lots of ongoing stress surrounding the issue of having to get out of his current apt by end of July, has only 1 prospect at a new apartment that suits his needs and it may not have a vacancy when he needs it.     Past Medical History:  Diagnosis Date   ALLERGIC RHINITIS 08/11/2006   ASTHMA 08/11/2006   Chronic atrial fibrillation (HCC) 08/2014   Chronic combined systolic and diastolic CHF (congestive heart failure) [facility])    Cardiology f/u 12/2017: pt volume overloaded (R heart dysf suspected), BNP very high, lasix increased.  Repeat echo 12/2017: normal LV EF, mild DD, +RV syst dysfxn, mod pulm HTN, biatrial enlgmt.   Chronic constipation    Chronic renal insufficiency, stage 2 (mild)    Borderline stage III (GFR 60s).   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  Plan repeat CBC at f/u summer 2019 and if decreased from baseline will check hemoccults and iron labs again.   OBESITY, MORBID 12/14/2007   saxenda started 05/2020 by [facility] wt mgmt center   OSA (obstructive sleep apnea)    to get sleep study with [facility] as of 12/03/2018 consult.   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  [id number] ID started him on amoxil prophylaxis---ineffective.  End 2018/Jan 2019 penicillin G injections prophyl helpful but pt declined to continue this as of 01/2018 ID f/u.  ID talked him into resuming monthly penicillin G as of 02/2018 f/u.     Restless leg syndrome    Rx'd clonazepam 09/2017 and pt refused to take it after reading the medication's potential side effects.   Venous stasis ulcers of both lower extremities (HCC)    Severe lymphedema.  wound clinic care ongoing as of 01/2018    Past Surgical History:  Procedure Laterality Date   Carotid dopplers  07/23/2018   Left NORMAL.  Right 1-39% ICA stenosis, with <50% distal CCA stenosis (not hemodynamically significant)   TRANSTHORACIC ECHOCARDIOGRAM  11/2007; 09/2014; 11/2015;12/2017   LV fxn normal, EF normal, mild dilation of left atrium.  2015 grade II diast dysfxn.  2017 EF 55-60%. 2019: LVEF 60-65%, mild RV syst dysf,biatrial enlgmt, mod pulm htn.   URETERAL STENT PLACEMENT     virtual colonoscopy  01/2011   Normal    Outpatient Medications Prior to Visit  Medication Sig Dispense Refill   arginine 500 MG tablet Take by mouth.     b complex vitamins capsule Take 1 capsule by mouth every morning.     butalbital-acetaminophen-caffeine (FIORICET WITH CODEINE) 50-325-40-30 MG capsule TAKE  1 TO 2 CAPSULES BY  MOUTH EVERY 6 HOURS AS  NEEDED FOR HEADACHE(S)  MANUFACTURER RECOMMENDS NOT EXCEEDING 6 CAPSULES/DAY 60 capsule 0   cetirizine (ZYRTEC) 10 MG tablet Take 10 mg by mouth daily.     CHROMIUM GTF PO Take by mouth daily.     Coenzyme Q10 (CO Q 10) 10 MG CAPS Take by mouth. Reported on 02/19/2016     diltiazem (TIAZAC) 240 MG 24 hr capsule TAKE 1 CAPSULE BY MOUTH  DAILY 90 capsule 1   furosemide (LASIX) 40 MG tablet TAKE 2 TABLETS BY MOUTH TWO TIMES DAILY (Patient taking differently: Take 80 mg in the mornings and 40 mg in the evenings) 360 tablet 3   Glutamine 500 MG CAPS Take by mouth.     HYDROcodone-acetaminophen (NORCO/VICODIN) 5-325 MG tablet 1-2 tabs po bid prn pain 60 tablet 0   Insulin Lispro Prot & Lispro (HUMALOG MIX 75/25 KWIKPEN) (75-25) 100 UNIT/ML Kwikpen INJECT SUBCUTANEOUSLY 25  UNITS EVERY MORNING AND 20  UNITS EVERY EVENING AT  SUPPER 45 mL 3   Insulin Pen Needle (B-D ULTRAFINE III SHORT PEN) 31G X 8 MM MISC USE 1 PENNEEDLE TWICE DAILY 200 each 1   lisinopril (ZESTRIL) 40 MG tablet TAKE 1 TABLET BY MOUTH  DAILY 90 tablet 3   metFORMIN (GLUCOPHAGE) 1000 MG tablet TAKE 1 TABLET BY MOUTH  TWICE DAILY WITH MEALS 180 tablet 1   metoprolol tartrate (LOPRESSOR) 50 MG tablet Take 0.5 tablets (25 mg total) by mouth 2 (two) times daily. 90 tablet 3   Multiple Vitamin (MULTIVITAMIN) tablet Take 1 tablet by mouth daily.     ONETOUCH ULTRA test strip CHECK BLOOD SUGAR TWICE  DAILY 200 strip 3   rivaroxaban (XARELTO) 20 MG TABS tablet TAKE 1 TABLET BY MOUTH ONCE DAILY WITH SUPPER 90 tablet 3   spironolactone (ALDACTONE) 25 MG tablet Take 1 tablet (25 mg total) by mouth daily. 30 tablet 0   terazosin (HYTRIN) 10 MG capsule TAKE 1 CAPSULE BY MOUTH  ONCE DAILY AT BEDTIME 90 capsule 3   vitamin E 400 UNIT capsule Take 400 Units by mouth every morning. Selenium 50mg      Facility-Administered Medications Prior to Visit  Medication Dose Route Frequency Provider Last Rate Last Admin    penicillin g benzathine (BICILLIN LA) 1200000 UNIT/2ML injection 600,000 Units  600,000 Units Intramuscular Q30 days , MD   600,000 Units at 04/12/20 0933   penicillin g benzathine (BICILLIN LA) 1200000 UNIT/2ML injection 600,000 Units  600,000 Units Intramuscular Q30 days 06/12/20, MD   600,000 Units at 04/04/21 0814   penicillin g benzathine (BICILLIN LA) 1200000 UNIT/2ML injection 600,000 Units  600,000 Units Intramuscular Q30 days 06/04/21, MD   600,000 Units at 04/04/21  0813   penicillin g benzathine (BICILLIN LA) 1200000 UNIT/2ML injection 600,000 Units  600,000 Units Intramuscular Q30 days [name], MD   600,000 Units at 02/28/21 0857    Allergies  Allergen Reactions   Other Other (See Comments)    Sneezing, coughing   Hydralazine Other (See Comments)    Drowsiness/sedation/mental fog    ROS As per HPI  PE: Vitals with BMI 05/02/2021 04/27/2021 04/20/2021  Height 5\' 9"  5\' 9"  5\' 9"   Weight 375 lbs 374 lbs 13 oz 372 lbs 13 oz  BMI 55.35 55.32 55.03  Systolic 123 131  Diastolic 56 68 67  Pulse 66 67 63   Gen: Alert, well appearing.  Patient is oriented to person, place, time, and situation. AFFECT: pleasant, lucid thought and speech. No further exam today.  LABS:    Chemistry      Component Value Date/Time   NA 140 04/20/2021 1152   NA 141 02/09/2020 1453   K 4.5 04/20/2021 1152   CL 100 04/20/2021 1152   CO2 30 04/20/2021 1152   BUN 26 (H) 04/20/2021 1152   BUN 20 02/09/2020 1453   CREATININE 1.39 04/20/2021 1152   CREATININE 1.57 (H) 04/04/2021 0844      Component Value Date/Time   CALCIUM 8.9 04/20/2021 1152   ALKPHOS 70 07/12/2020 1015   AST 15 04/04/2021 0844   ALT 11 04/04/2021 0844   BILITOT 1.0 04/04/2021 0844     Lab Results  Component Value Date   HGBA1C 7.5 (H) 04/04/2021    IMPRESSION AND PLAN:  HTN, uncontrolled.  Some sx's yesterday from exhaustion, not from spironolactone. BP today normal  and feels well and has not taken spironolactone yesterday or today. Given all the life stress he's experiencing and his overall bp issue lately, I am going to lean towards permissive HTN at this time.   Instructions: Take spironolactone 25mg  tab if your blood pressure is consistently >150 on top or >90 on bottom. If your blood pressure is <150/90 then you don't have to take spironolactone. Meanwhile continue metoprolol, terazosin, lisinopril, and diltiazem at current doses.  Check bmet today.   An After Visit Summary was printed and given to the patient.  FOLLOW UP: No follow-ups on file.  Signed:  06/04/2021, MD           05/02/2021
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
## 2021-05-02 NOTE — Telephone Encounter (Signed)
Pt has appt today and would like to discuss in appt

## 2021-05-03 LAB — BASIC METABOLIC PANEL
BUN: 22 mg/dL (ref 6–23)
CO2: 28 mEq/L (ref 19–32)
Calcium: 8.7 mg/dL (ref 8.4–10.5)
Chloride: 99 mEq/L (ref 96–112)
Creatinine, Ser: 1.59 mg/dL — ABNORMAL HIGH (ref 0.40–1.50)
GFR: 43.84 mL/min — ABNORMAL LOW (ref >60.00)
Glucose, Bld: 211 mg/dL — ABNORMAL HIGH (ref 70–99)
Potassium: 5.2 mEq/L — ABNORMAL HIGH (ref 3.5–5.1)
Sodium: 137 mEq/L (ref 135–145)

## 2021-05-04 ENCOUNTER — Other Ambulatory Visit: Payer: Self-pay

## 2021-05-04 DIAGNOSIS — E875 Hyperkalemia: Secondary | ICD-10-CM

## 2021-05-09 ENCOUNTER — Ambulatory Visit (INDEPENDENT_AMBULATORY_CARE_PROVIDER_SITE_OTHER): Payer: Medicare Other

## 2021-05-09 ENCOUNTER — Encounter (HOSPITAL_BASED_OUTPATIENT_CLINIC_OR_DEPARTMENT_OTHER): Payer: Medicare Other | Attending: Internal Medicine | Admitting: Physician Assistant

## 2021-05-09 ENCOUNTER — Other Ambulatory Visit: Payer: Self-pay

## 2021-05-09 DIAGNOSIS — L97521 Non-pressure chronic ulcer of other 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 of skin: Secondary | ICD-10-CM | POA: Insufficient documentation

## 2021-05-09 DIAGNOSIS — I890 Lymphedema, not elsewhere classified: Secondary | ICD-10-CM | POA: Diagnosis not present

## 2021-05-09 DIAGNOSIS — I739 Peripheral vascular disease, unspecified: Secondary | ICD-10-CM | POA: Insufficient documentation

## 2021-05-09 DIAGNOSIS — E875 Hyperkalemia: Secondary | ICD-10-CM

## 2021-05-09 DIAGNOSIS — I110 Hypertensive heart disease with heart failure: Secondary | ICD-10-CM | POA: Diagnosis not present

## 2021-05-09 DIAGNOSIS — I87333 Chronic venous hypertension (idiopathic) with ulcer and inflammation of bilateral lower extremity: Secondary | ICD-10-CM | POA: Insufficient documentation

## 2021-05-09 DIAGNOSIS — E1151 Type 2 diabetes mellitus with diabetic peripheral angiopathy without gangrene: Secondary | ICD-10-CM | POA: Diagnosis not present

## 2021-05-09 DIAGNOSIS — I272 Pulmonary hypertension, unspecified: Secondary | ICD-10-CM | POA: Insufficient documentation

## 2021-05-09 DIAGNOSIS — L97822 Non-pressure chronic ulcer of other part of left lower leg with fat layer exposed: Secondary | ICD-10-CM | POA: Diagnosis not present

## 2021-05-09 DIAGNOSIS — I872 Venous insufficiency (chronic) (peripheral): Secondary | ICD-10-CM | POA: Diagnosis not present

## 2021-05-09 DIAGNOSIS — I509 Heart failure, unspecified: Secondary | ICD-10-CM | POA: Insufficient documentation

## 2021-05-09 DIAGNOSIS — I482 Chronic atrial fibrillation, unspecified: Secondary | ICD-10-CM | POA: Diagnosis not present

## 2021-05-09 DIAGNOSIS — L03116 Cellulitis of left lower limb: Secondary | ICD-10-CM | POA: Diagnosis not present

## 2021-05-09 DIAGNOSIS — L97522 Non-pressure chronic ulcer of other part of left foot with fat layer exposed: Secondary | ICD-10-CM | POA: Diagnosis not present

## 2021-05-09 DIAGNOSIS — E11622 Type 2 diabetes mellitus with other skin ulcer: Secondary | ICD-10-CM | POA: Insufficient documentation

## 2021-05-09 DIAGNOSIS — L03119 Cellulitis of unspecified part of limb: Secondary | ICD-10-CM

## 2021-05-09 LAB — BASIC METABOLIC PANEL
BUN: 26 mg/dL — ABNORMAL HIGH (ref 6–23)
CO2: 28 mEq/L (ref 19–32)
Calcium: 8.5 mg/dL (ref 8.4–10.5)
Chloride: 100 mEq/L (ref 96–112)
Creatinine, Ser: 1.51 mg/dL — ABNORMAL HIGH (ref 0.40–1.50)
GFR: 46.64 mL/min — ABNORMAL LOW (ref >60.00)
Glucose, Bld: 123 mg/dL — ABNORMAL HIGH (ref 70–99)
Potassium: 4.4 mEq/L (ref 3.5–5.1)
Sodium: 138 mEq/L (ref 135–145)
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
## 2021-05-09 NOTE — Progress Notes (Addendum)
[name] is a 70 y.o. male presents to the office today for monthly bicillin injections, per physician's orders.   Bicillin 600,000 Units was administered bilaterally in Right and Left upper outter quad today. Patient tolerated injection.  Please sign in PCP absence.

## 2021-05-09 NOTE — Progress Notes (Addendum)
DANDREA, WIDDOWSON ([id number]) Visit Report for 05/09/2021 Chief Complaint Document Details Patient Name: Date of Service: CO BURWELL, BETHEL 05/09/2021 10:30 A M Medical Record Number: [id number] Patient Account Number: [id number] Date of Birth/Sex: Treating RN: 02/27/1951 (70 y.o. [name] Primary Care Provider: Nicoletta Ba Other Clinician: Referring Provider: Treating Provider/Extender: Adele Dan in Treatment: 276 Information Obtained from: Patient Chief Complaint patient is here for evaluation of venous/lymphedema ulcers Electronic Signature(s) Signed: 05/09/2021 11:39:22 AM By: Lenda Kelp PA-C Entered By: Lenda Kelp on 05/09/2021 11:39:22 -------------------------------------------------------------------------------- HPI Details Patient Name: Date of Service: [name], Jerimie J. 05/09/2021 10:30 A M Medical Record Number: [id number] Patient Account Number: [id number] Date of Birth/Sex: Treating RN: 31-May-1951 (70 y.o. [name] Primary Care Provider: Nicoletta Ba Other Clinician: Referring Provider: Treating Provider/Extender: Adele Dan in Treatment: 276 History of Present Illness HPI Description: Referred by PCP for consultation. Patient has long standing history of BLE venous stasis, no prior ulcerations. At beginning of month, developed cellulitis and weeping. Received IM Rocephin followed by Keflex and resolved. Wears compression stocking, appr 6 months old. Not sure strength. No present drainage. 01/22/16 this is a patient who is a type II diabetic on insulin. 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 He tells me he religiously wears pressure stockings of uncertain strength. He was here with weeping edema about 8 months ago but did not have an open wound. Roughly a month ago he had a reopening on his bilateral legs. He is been using bandages and Neosporin. He does  not complain of pain. 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 Last BUN/creatinine I have is from 11/20/15 at 13 and 1.0 respectively 01/29/16; patient arrives today having tolerated the Profore wrap. He brought in his stockings and these are 18 mmHg stockings he bought from [place] [facility] [place]. The compression here is likely inadequate. He does not complain of pain or excessive drainage she has no systemic symptoms. The wound on the right looks improved as does the one on the left although one on the left is more substantial with still tissue at risk below the actual wound area on the bilateral posterior calf 02/05/16; patient arrives with poor edema control. He states that we did put a 4 layer compression on it last week. No weight appear 5 this. 02/12/16; the area on the posterior right Has healed. The left Has a substantial wound that has necrotic surface eschar that requires a debridement with a curette. 02/16/16;the patient called or a Nurse visit secondary to increased swelling. He had been in earlier in the week with his right leg healed. He was transitioned to is on pressure stocking on the right leg with the only open wound on the left, a substantial area on the left posterior calf. Note he has a history of severe lower extremity edema, he has a history of chronic atrial fibrillation but not heart failure per my notes but I'll need to research this. He is not complaining of chest pain shortness of breath or orthopnea. The intake nurse noted blisters on the previously closed right leg 02/19/16; this is the patient's regular visit day. I see him on Friday with escalating edema new wounds on the right leg and clear signs of at least right ventricular heart failure. I increased his Lasix to 40 twice a  day. He is returning currently in follow-up. States he is noticed a decrease in that the edema 02/26/16  patient's legs have much less edema. There is nothing really open on the right leg. The left leg has improved condition of the large superficial wound on the posterior left leg 03/04/16; edema control is very much better. The patient's right leg wounds have healed. On the left leg he continues to have severe venous inflammation on the posterior aspect of the left leg. There is no tenderness and I don't think any of this is cellulitis. 03/11/16; patient's right leg is married healed and he is in his own stocking. The patient's left leg has deteriorated somewhat. There is a lot of erythema around the wound on the posterior left leg. There is also a significant rim of erythema posteriorly just above where the wrap would've ended there is a new wound in this location and a lot of tenderness. Can't rule out cellulitis in this area. 03/15/16; patient's right leg remains healed and he is in his own stocking. The patient's left leg is much better than last review. His major wound on the posterior aspect of his left Is almost fully epithelialized. He has 3 small injuries from the wraps. Really. Erythema seems a lot better on antibiotics 03/18/16; right leg remains healed and he is in his own stocking. The patient's left leg is much better. The area on the posterior aspect of the left calf is fully epithelialized. His 3 small injuries which were wrap injuries on the left are improved only one seems still open his erythema has resolved 03/25/16; patient's right leg remains healed and he is in his own stocking. There is no open area today on the left leg posterior leg is completely closed up. His wrap injuries at the superior aspect of his leg are also resolved. He looks as though he has some irritation on the dorsal ankle but this is fully epithelialized without evidence of infection. 03/28/16; we discharged this patient on Monday. Transitioned him into his own stocking. There were problems almost immediately with  uncontrolled swelling weeping edema multiple some of which have opened. He does not feel systemically unwell in particular no chest pain no shortness of breath and he does not feel 04/08/16; the edema is under better control with the Profore light wrap but he still has pitting edema. There is one large wound anteriorly 2 on the medial aspect of his left leg and 3 small areas on the superior posterior calf. Drainage is not excessive he is tolerating a Profore light well 04/15/16; put a Profore wrap on him last week. This is controlled is edema however he had a lot of pain on his left anterior foot most of his wounds are healed 04/22/16 once again the patient has denuded areas on the left anterior foot which he states are because his wrap slips up word. He saw his primary physician today is on Lasix 40 twice a day and states that he his weight is down 20 pounds over the last 3 months. 04/29/16: Much improved. left anterior foot much improved. He is now on Lasix 80 mg per day. Much improved edema control 05/06/16; I was hoping to be able to discharge him today however once again he has blisters at a low level of where the compression was placed last week mostly on his left lateral but also his left medial leg and a small area on the anterior part of the left foot. 05/09/16; apparently the patient  went home after his appointment on 7/4 later in the evening developing pain in his upper medial thigh together with subjective fever and chills although his temperature was not taken. The pain was so intense he felt he would probably have to call 911. However he then remembered that he had leftover doxycycline from a previous round of antibiotics and took these. By the next morning he felt a lot better. He called and spoke to one of our nurses and I approved doxycycline over the phone thinking that this was in relation to the wounds we had previously seen although they were definitely were not. The patient feels a lot  better old fever no chills he is still working. Blood sugars are reasonably controlled 05/13/16; patient is back in for review of his cellulitis on his anterior medial upper thigh. He is taking doxycycline this is a lot better. Culture I did of the nodular area on the dorsal aspect of his foot grew MRSA this also looks a lot better. 05/20/16; the patient is cellulitis on the medial upper thigh has resolved. All of his wound areas including the left anterior foot, areas on the medial aspect of the left calf and the lateral aspect of the calf at all resolved. He has a new blister on the left dorsal foot at the level of the fourth toe this was excised. No evidence of infection 05/27/16; patient continues to complain weeping edema. He has new blisterlike wounds on the left anterior lateral and posterior lateral calf at the top of his wrap levels. The area on his left anterior foot appears better. He is not complaining of fever, pain or pruritus in his feet. 05/30/16; the patient's blisters on his left anterior leg posterior calf all look improved. He did not increase the Lasix 100 mg as I suggested because he was going to run out of his 40 mg tablets. He is still having weeping edema of his toes 06/03/16; I renewed his Lasix at 80 mg once a day as he was about to run out when I last saw him. He is on 80 mg of Lasix now. I have asked him to cut down on the excessive amount of water he was drinking and asked him to drink according to his thirst mechanisms 06/12/2016 -- was seen 2 days ago and was supposed to wear his compression stockings at home but he is developed lymphedema and superficial blisters on the left lower extremity and hence came in for a review 06/24/16; the remaining wound is on his left anterior leg. He still has edema coming from between his toes. There is lymphedema here however his edema is generally better than when I last saw this. He has a history of atrial fibrillation but does not have a  known history of congestive heart failure nevertheless I think he probably has this at least on a diastolic basis. 07/01/16 I reviewed his echocardiogram from January 2017. This was essentially normal. He did not have LVH, EF of 55-60%. His right ventricular function was normal although he did have trivial tricuspid and pulmonic regurgitation. This is not audible on exam however. I increased his Lasix to do massive edema in his legs well above his knees I think in early July. He was also drinking an excessive amount of water at the time. 07/15/16; missed his appointment last week because of the Labor Day holiday on Monday. He could not get another appointment later in the week. Started to feel the wrap digging in superiorly  so we remove the top half and the bottom half of his wrap. He has extensive erythema and blistering superiorly in the left leg. Very tender. Very swollen. Edema in his foot with leaking edema fluid. He has not been systemically unwell 07/22/16; the area on the left leg laterally required some debridement. The medial wounds look more stable. His wrap injury wounds appear to have healed. Edema and his foot is better, weeping edema is also better. He tells me he is meeting with the supplier of the external compression pumps at work 08/05/16; the patient was on vacation last week in [facility]. His wrap is been on for an extended period of time. Also over the weekend he developed an extensive area of tender erythema across his anterior medial thigh. He took to doxycycline yesterday that he had leftover from a previous prescription. The patient complains of weeping edema coming out of his toes 08/08/16; I saw this patient on 10/2. He was tender across his anterior thigh. I put him on doxycycline. He returns today in follow-up. He does not have any open wounds on his lower leg, he still has edema weeping into his toes. 08/12/16; patient was seen back urgently today to follow-up for his  extensive left thigh cellulitis/erysipelas. He comes back with a lot less swelling and erythema pain is much better. I believe I gave him Augmentin and Cipro. His wrap was cut down as he stated a roll down his legs. He developed blistering above the level of the wrap that remained. He has 2 open blisters and 1 intact. 08/19/16; patient is been doing his primary doctor who is increased his Lasix from 40-80 once a day or 80 already has less edema. Cellulitis has remained improved in the left thigh. 2 open areas on the posterior left calf 08/26/16; he returns today having new open blisters on the anterior part of his left leg. He has his compression pumps but is not yet been shown how to use some vital representative from the supplier. 09/02/16 patient returns today with no open wounds on the left leg. Some maceration in his plantar toes 09/10/2016 -- Dr. Leanord Hawking had recently discharged him on 09/02/2016 and he has come right back with redness swelling and some open ulcers on his left lower extremity. He says this was caused by trying to apply his compression stockings and he's been unable to use this and has not been able to use his lymphedema pumps. He had some doxycycline leftover and he has started on this a few days ago. 09/16/16; there are no open wounds on his leg on the left and no evidence of cellulitis. 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 He does not complain of symptoms here. I am not clear use using his external compression pumps. 09/23/16; I have not seen this patient in 2 weeks. He canceled his appointment 10 days ago as he was going on vacation. He tells me that on Monday he noticed a large area on his posterior left leg which is been draining copiously and is reopened into a large wound. He is been using ABDs and the external part of his juxtalite, according to our nurse this was not on properly. 10/07/16; Still a substantial  area on the posterior left leg. Using silver alginate 10/14/16; in general better although there is still open area which looks healthy. Still using silver alginate. He reminds me that this happen before he left for [facility]  [place]. T oday while he was showering in the morning. He had been using his juxtalite's 10/21/16; the area on his posterior left leg is fully epithelialized. However he arrives today with a large area of tender erythema in his medial and posterior left thigh just above the knee. I have marked the area. Once again he is reluctant to consider hospitalization. I treated him with oral antibiotics in the past for a similar situation with resolution I think with doxycycline however this area it seems more extensive to me. He is not complaining of fever but does have chills and says states he is thirsty. His blood sugar today was in the 140s at home 10/25/16 the area on his posterior left leg is fully epithelialized although there is still some weeping edema. The large area of tenderness and erythema in his medial and posterior left thigh is a lot less tender although there is still a lot of swelling in this thigh. He states he feels a lot better. He is on doxycycline and Augmentin that I started last week. This will continued until Tuesday, December 26. I have ordered a duplex ultrasound of the left thigh rule out DVT whether there is an abscess something that would need to be drained I would also like to know. 11/01/16; he still has weeping edema from a not fully epithelialized area on his left posterior calf. Most of the rest of this looks a lot better. He has completed his antibiotics. His thigh is a lot better. Duplex ultrasound did not show a DVT in the thigh 11/08/16; he comes in today with more Denuded surface epithelium from the posterior aspect of his calf. There is no real evidence of cellulitis. The superior aspect of his wrap appears to have put quite an indentation in his leg  just below the knee and this may have contributed. He does not complain of pain or fever. We have been using silver alginate as the primary dressing. The area of cellulitis in the right thigh has totally resolved. He has been using his compression stockings once a week 11/15/16; the patient arrives today with more loss of epithelium from the posterior aspect of his left calf. He now has a fairly substantial wound in this area. The reason behind this deterioration isn't exactly clear although his edema is not well controlled. He states he feels he is generally more swollen systemically. He is not complaining of chest pain shortness of breath fever. T me he has an appointment with his primary physician in early February. He is on 80 mg of oral ells Lasix a day. He claims compliance with the external compression pumps. He is not having any pain in his legs similar to what he has with his recurrent cellulitis 11/22/16; the patient arrives a follow-up of his large area on his left lateral calf. This looks somewhat better today. He came in earlier in the week for a dressing change since I saw him a week ago. He is not complaining of any pain no shortness of breath no chest pain 11/28/16; the patient arrives for follow-up of his large area on the left lateral calf this does not look better. In fact it is larger weeping edema. The surface of the wound does not look too bad. We have been using silver alginate although I'm not certain that this is a dressing issue. 12/05/16; again the patient follows up for a large wound on the left lateral and left posterior calf this does not look better. There  continues to be weeping edema necrotic surface tissue. More worrisome than this once again there is erythema below the wound involving the distal Achilles and heel suggestive of cellulitis. He is on his feet working most of the day of this is not going well. We are changing his dressing twice a week to facilitate the  drainage. 12/12/16; not much change in the overall dimensions of the large area on the left posterior calf. This is very inflamed looking. I gave him an. Doxycycline last week does not really seem to have helped. He found the wrap very painful indeed it seems to of dog into his legs superiorly and perhaps around the heel. He came in early today because the drainage had soaked through his dressings. 12/19/16- patient arrives for follow-up evaluation of his left lower extremity ulcers. He states that he is using his lymphedema pumps once daily when there is "no drainage". He admits to not using his lipedema pumps while under current treatment. His blood sugars have been consistently between 150-200. 12/26/16; the patient is not using his compression pumps at home because of the wetness on his feet. I've advised him that I think it's important for him to use this daily. He finds his feet too wet, he can put a plastic bag over his legs while he is in the pumps. Otherwise I think will be in a vicious circle. We are using silver alginate to the major area on his left posterior calf 01/02/17; the patient's posterior left leg has further of all into 3 open wounds. All of them covered with a necrotic surface. He claims to be using his compression pumps once a day. His edema control is marginal. Continue with silver alginate 01/10/17; the patient's left posterior leg actually looks somewhat better. There is less edema, less erythema. Still has 3 open areas covered with a necrotic surface requiring debridement. He claims to be using his compression pumps once a day his edema control is better 01/17/17; the patient's left posterior calf look better last week when I saw him and his wrap was changed 2 days ago. He has noted increasing pain in the left heel and arrives today with much larger wounds extensive erythema extending down into the entire heel area especially tender medially. He is not systemically unwell CBGs have  been controlled no fever. Our intake nurse showed me limegreen drainage on his AVD pads. 01/24/17; his usual this patient responds nicely to antibiotics last week giving him Levaquin for presumed Pseudomonas. The whole entire posterior part of his leg is much better much less inflamed and in the case of his Achilles heel area much less tender. He has also had some epithelialization posteriorly there are still open areas here and still draining but overall considerably better 01/31/17- He has continue to tolerate the compression wraps. he states that he continues to use the lymphedema pumps daily, and can increase to twice daily on the weekends. He is voicing no complaints or concerns regarding his LLE ulcers 02/07/17-he is here for follow-up evaluation. He states that he noted some erythema to the left medial and anterior thigh, which he states is new as of yesterday. He is concerned about recurrent cellulitis. He states his blood sugars have been slightly elevated, this morning in the 180s 02/14/17; he is here for follow-up evaluation. When he was last here there was erythema superiorly from his posterior wound in his anterior thigh. He was prescribed Levaquin however a culture of the wound surface grew MRSA over  the phone I changed him to doxycycline on Monday and things seem to be a lot better. 02/24/17; patient missed his appointment on Friday therefore we changed his nurse visit into a physician visit today. Still using silver alginate on the large area of the posterior left thigh. He isn't new area on the dorsal left second toe 03/03/17; actually better today although he admits he has not used his external compression pumps in the last 2 days or so because of work responsibilities over the weekend. 03/10/17; continued improvement. External compression pumps once a day almost all of his wounds have closed on the posterior left calf. Better edema control 03/17/17; in general improved. He still has 3 small  open areas on the lateral aspect of his left leg however most of the area on the posterior part of his leg is epithelialized. He has better edema control. He has an ABD pad under his stocking on the right anterior lower leg although he did not let us look at that today. 03/24/17; patient arrives back in clinic today with no open areas however there are areas on the posterior left calf and anterior left calf that are less than 100% epithelialized. His edema is well controlled in the left lower leg. There is some pitting edema probably lymphedema in the left upper thigh. He uses compression pumps at home once per day. I tried to get him to do this twice a day although he is very reticent. 04/01/2017 -- for the last 2 days he's had significant redness, tenderness and weeping and came in for an urgent visit today. 04/07/17; patient still has 6 more days of doxycycline. He was seen by Dr. Meyer Russel last Wednesday for cellulitis involving the posterior aspect, lateral aspect of his Involving his heel. For the most part he is better there is less erythema and less weeping. He has been on his feet for 12 hours 2 over the weekend. Using his compression pumps once a day 04/14/17 arrives today with continued improvement. Only one area on the posterior left calf that is not fully epithelialized. He has intense bilateral venous inflammation associated with his chronic venous insufficiency disease and secondary lymphedema. We have been using silver alginate to the left posterior calf wound In passing he tells [place] today that the right leg but we have not seen in quite some time has an open area on it but he doesn't want [place] to look at this today states he will show this to [place] next week. 04/21/17; there is no open area on his left leg although he still reports some weeping edema. He showed [place] his right leg today which is the first time we've seen this leg in a long time. He has a large area of open wound on the right leg  anteriorly healthy granulation. Quite a bit of swelling in the right leg and some degree of venous inflammation. He told us about the right leg in passing last week but states that deterioration in the right leg really only happened over the weekend 04/28/17; there is no open area on the left leg although there is an irritated part on the posterior which is like a wrap injury. The wound on the right leg which was new from last week at least to [place] is a lot better. 05/05/17; still no open area on the left leg. Patient is using his new compression stocking which seems to be doing a good job of controlling the edema. He states he is using his  compression pumps once per day. The right leg still has an open wound although it is better in terms of surface area. Required debridement. A lot of pain in the posterior right Achilles marked tenderness. Usually this type of presentation this patient gives concern for an active cellulitis 05/12/17; patient arrives today with his major wound from last week on the right lateral leg somewhat better. Still requiring debridement. He was using his compression stocking on the left leg however that is reopened with superficial wounds anteriorly he did not have an open wound on this leg previously. He is still using his juxta light's once daily at night. He cannot find the time to do this in the morning as he has to be at work by 7 AM 05/19/17; right lateral leg wound looks improved. No debridement required. The concerning area is on the left posterior leg which appears to almost have a subcutaneous hemorrhagic component to it. We've been using silver alginate to all the wounds 05/26/17; the right lateral leg wound continues to look improved. However the area on the left posterior calf is a tightly adherent surface. [place] using silver alginate. Because of the weeping edema in his legs there is very little good alternatives. 06/02/17; the patient left here last week looking quite  good. Major wound on the left posterior calf and a small one on the right lateral calf. Both of these look satisfactory. He tells me that by Wednesday he had noted increased pain in the left leg and drainage. He called on Thursday and Friday to get an appointment here but we were blocked. He did not go to urgent care or his primary physician. He thinks he had a fever on Thursday but did not actually take his temperature. He has not been using his compression pumps on the left leg because of pain. I advised him to go to the emergency room today for IV antibiotics for stents of left leg cellulitis but he has refused I have asked him to take 2 days off work to keep his leg elevated and he has refused this as well. In view of this I'm going to call him and Augmentin and doxycycline. 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 However after a detailed discussion regarding the risk benefits and alternatives he refuses to get admitted to the hospital. With no other recourse I will continue him on oral antibiotics as before and hopefully he'll have his infectious disease consultation this week. 06/16/2017 -- the patient was seen today by the nurse practitioner at infectious disease Ms. Dixon. 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 He has also been advise Lotrimin cream locally. she also had wise IV antibiotics for his condition if it flares up 06/23/17; patient arrives today with drainage bilaterally although the remaining wound on the left posterior calf after cleaning up today "highlighter  yellow drainage" did not look too bad. Unfortunately he has had breakdown on the right anterior leg [previously this leg had not been open and he is using a black stocking] he went to see infectious  disease and is been put on clindamycin 150 mg daily, I did not verify the dose although I'm not familiar with using clindamycin in this dosing range, perhaps for prophylaxisoo 06/27/17; I brought this patient back today to follow-up on the wound deterioration on the right lower leg together with surrounding cellulitis. I started him on doxycycline 4 days ago. This area looks better however he comes in today with intense cellulitis on the medial part of his left thigh. This is not have a wound in this area. Extremely tender. We've been using silver alginate to the wounds on the right lower leg left lower leg with bilateral 4 layer compression he is using his external compression pumps once a day 07/04/17; patient's left medial thigh cellulitis looks better. He has not been using his compression pumps as his insert said it was contraindicated with cellulitis. His right leg continues to make improvements all the wounds are still open. We only have one remaining wound on the left posterior calf. Using silver alginate to all open areas. He is on doxycycline which I started a week ago and should be finishing I gave him Augmentin after Thursday's visit for the severe cellulitis on the left medial thigh which fortunately looks better 07/14/17; the patient's left medial thigh cellulitis has resolved. The cellulitis in his right lower calf on the right also looks better. All of his wounds are stable to improved we've been using silver alginate he has completed the antibiotics I have given him. He has clindamycin 150 mg once a day prescribed by infectious disease for prophylaxis, I've advised him to start this now. We have been using bilateral Unna boots over silver alginate to the wound areas 07/21/17; the patient is  been to see infectious disease who noted his recurrent problems with cellulitis. He was not able to tolerate prophylactic clindamycin therefore he is on amoxicillin 500 twice a day. He also had a second daily dose of Lasix added By Dr. Oneta Rack but he is not taking this. Nor is he being completely compliant with his compression pumps a especially not this week. He has 2 remaining wounds one on the right posterior lateral lower leg and one on the left posterior medial lower leg. 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 The patient has Unna boots bilaterally. 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 He has bilateral Unna boots for compression and silver alginate to his wounds. Arrives today with his legs looking as good as I have seen him in quite some time. Not surprisingly his wounds look better as well with improvement on the right lateral leg venous insufficiency wound and also the left medial leg. He is still using the compression pumps once a day 08/11/17; both legs appear to be doing better wounds on the right lateral and left medial legs look better. Skin on the right leg quite good. He is been using silver alginate as the primary dressing. I'm going to use Anasept gel calcium alginate and maintain all the secondary dressings 08/18/17; the patient continues to actually do quite well. The area on his right lateral leg is just about closed the left medial also looks better although it is still moist in this area. His edema is well controlled we have been using Anasept gel with calcium alginate  and the usual secondary dressings, 4 layer compression and once daily use of his compression pumps "always been able to manage 09/01/17; the patient continues to do reasonably well in  spite of his trip to [place]. The area on the right lateral leg is epithelialized. Left is much better but still open. He has more edema and more chronic erythema on the left leg [venous inflammation] 09/08/17; he arrives today with no open wound on the right lateral leg and decently controlled edema. Unfortunately his left leg is not nearly as in his good situation as last week.he apparently had increasing edema starting on Saturday. He edema soaked through into his foot so used a plastic bag to walk around his home. The area on the medial right leg which was his open area is about the same however he has lost surface epithelium on the left lateral which is new and he has significant pain in the Achilles area of the left foot. He is already on amoxicillin chronically for prophylaxis of cellulitis in the left leg 09/15/17; he is completed a week of doxycycline and the cellulitis in the left posterior leg and Achilles area is as usual improved. He still has a lot of edema and fluid soaking through his dressings. There is no open wound on the right leg. He saw infectious disease NP today 09/22/17;As usual 1 we transition him from our compression wraps to his stockings things did not go well. He has several small open areas on the right leg. He states this was caused by the compression wrap on his skin although he did not wear this with the stockings over them. He has several superficial areas on the left leg medially laterally posteriorly. He does not have any evidence of active cellulitis especially involving the left Achilles The patient is traveling from [facility] Saturday going to [facility]. He states he isn't attempting to get an appointment with a heel objects wound center there to change his dressings. I am not completely certain whether this will work 10/06/17; the patient came in on Friday for a nurse visit and the nurse reported that his legs actually look quite good. He arrives in clinic  today for his regular follow-up visit. He has a new wound on his left third toe over the PIP probably caused by friction with his footwear. He has small areas on the left leg and a very superficial but epithelialized area on the right anterior lateral lower leg. Other than that his legs look as good as I've seen him in quite some time. We have been using silver alginate Review of systems; no chest pain no shortness of breath other than this a 10 point review of systems negative 10/20/17; seen by Dr. Meyer Russel last week. He had taken some antibiotics [doxycycline] that he had left over. Dr. Meyer Russel thought he had candida infection and declined to give him further antibiotics. He has a small wound remaining on the right lateral leg several areas on the left leg including a larger area on the left posterior several left medial and anterior and a small wound on the left lateral. The area on the left dorsal third toe looks a lot better. ROS; Gen.; no fever, respiratory no cough no sputum Cardiac no chest pain other than this 10 point review of system is negative 10/30/17; patient arrives today having fallen in the bathtub 3 days ago. It took him a while to get up. He has pain and maceration in the wounds on his  left leg which have deteriorated. He has not been using his pumps he also has some maceration on the right lateral leg. 11/03/17; patient continues to have weeping edema especially in the left leg. This saturates his dressings which were just put on on 12/27. As usual the doxycycline seems to take care of the cellulitis on his lower leg. He is not complaining of fever, chills, or other systemic symptoms. He states his leg feels a lot better on the doxycycline I gave him empirically. He also apparently gets injections at his primary doctor's officeo Rocephin for cellulitis prophylaxis. I didn't ask him about his compression pump compliance today I think that's probably marginal. Arrives in the clinic  with all of his dressings primary and secondary macerated full of fluid and he has bilateral edema 11/10/17; the patient's right leg looks some better although there is still a cluster of wounds on the right lateral. The left leg is inflamed with almost circumferential skin loss medially to laterally although we are still maintaining anteriorly. He does not have overt cellulitis there is a lot of drainage. He is not using compression pumps. We have been using silver alginate to the wound areas, there are not a lot of options here 11/17/17; the patient's right leg continues to be stable although there is still open wounds, better than last week. The inflammation in the left leg is better. Still loss of surface layer epithelium especially posteriorly. There is no overt cellulitis in the amount of edema and his left leg is really quite good, tells me he is using his compression pumps once a day. 11/24/17; patient's right leg has a small superficial wound laterally this continues to improve. The inflammation in the left leg is still improving however we have continuous surface layer epithelial loss posteriorly. There is no overt cellulitis in the amount of edema in both legs is really quite good. He states he is using his compression pumps on the left leg once a day for 5 out of 7 days 12/01/17; very small superficial areas on the right lateral leg continue to improve. Edema control in both legs is better today. He has continued loss of surface epithelialization and left posterior calf although I think this is better. We have been using silver alginate with large number of absorptive secondary dressings 4 layer on the left Unna boot on the right at his request. He tells me he is using his compression pumps once a day 12/08/17; he has no open area on the right leg is edema control is good here. On the left leg however he has marked erythema and tenderness breakdown of skin. He has what appears to be a wrap injury  just distal to the popliteal fossa. 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 There was swelling erythema and breakdown in the right leg. He has much worse skin breakdown in the left leg as well multiple open areas medially and posteriorly as well as laterally. He tells me he has been using his compression pumps but tells me he feels that the drainage out of his leg is worse when he uses a compression pumps. T be fair to him he is been saying this o for a while however I  don't know that I have really been listening to this. I wonder if the compression pumps are working properly 12/22/17;. Once again he arrives with severe erythema, weeping edema from the left greater than right leg. Noncompliance with compression pumps. New this visit he is complaining of pain on the lateral aspect of the right leg and the medial aspect of his right thigh. He apparently saw his cardiologist Dr. [name] who was ordered an echocardiogram area and I think this is a step in the right direction 12/25/17; started his doxycycline Monday night. There is still intense erythema of the right leg especially in the anterior thigh although there is less tenderness. The erythema around the wound on the right lateral calf also is less tender. He still complaining of pain in the left heel. His wounds are about the same right lateral left medial left lateral. Superficial but certainly not close to closure. He denies being systemically unwell no fever chills no abdominal pain no diarrhea 12/29/17; back in follow-up of his extensive right calf and right thigh cellulitis. I added amoxicillin to cover possible doxycycline resistant strep. This seems to of done the  trick he is in much less pain there is much less erythema and swelling. He has his echocardiogram at 11:00 this morning. X-ray of the left heel was also negative. 01/05/18; the patient arrived with his edema under much better control. Now that he is retired he is able to use his compression pumps daily and sometimes twice a day per the patient. He has a wound on the right leg the lateral wound looks better. Area on the left leg also looks a lot better. He has no evidence of cellulitis in his bilateral thighs I had a quick peak at his echocardiogram. He is in normal ejection fraction and normal left ventricular function. He has moderate pulmonary hypertension moderately reduced right ventricular function. One would have to wonder about chronic sleep apnea although he says he doesn't snore. He'll review the echocardiogram with his cardiologist. 01/12/18; the patient arrives with the edema in both legs under exemplary control. He is using his compression pumps daily and sometimes twice daily. His wound on the right lateral leg is just about closed. He still has some weeping areas on the posterior left calf and lateral left calf although everything is just about closed here as well. I have spoken with Aldean Baker who is the patient's nurse practitioner and infectious disease. She was concerned that the patient had not understood that the parenteral penicillin injections he was receiving for cellulitis prophylaxis was actually benefiting him. I don't think the patient actually saw that I would tend to agree we were certainly dealing with less infections although he had a serious one last month. 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 He is healed. He'll be placed in juxtalite compression wraps and increase his lymphedema pumps to twice daily. We will follow up again next week to ensure there are no issues with the new regiment. 01/20/18-he is here for evaluation of  bilateral lower extremity weeping edema. Yesterday he was placed in compression wrap to the right lower extremity and compression stocking to left lower shrubbery. He states he uses lymphedema pumps last night and again this morning and noted a blister to the left lower extremity. On exam he was noted to have drainage to the right lower extremity. He will be placed in Unna boots bilaterally and follow-up next week 01/26/18; patient was actually discharged a  week ago to his own juxta light stockings only to return the next day with bilateral lower extremity weeping edema.he was placed in bilateral Unna boots. He arrives today with pain in the back of his left leg. There is no open area on the right leg however there is a linear/wrap injury on the left leg and weeping edema on the left leg posteriorly. I spoke with infectious disease about 10 days ago. They were disappointed that the patient elected to discontinue prophylactic intramuscular penicillin shots as they felt it was particularly beneficial in reducing the frequency of his cellulitis. I discussed this with the patient today. He does not share this view. He'll definitely need antibiotics today. Finally he is traveling to [place] and trauma leaving this Saturday and returning a week later and he does not travel with his pumps. He is going by car 01/30/18; patient was seen 4 days ago and brought back in today for review of cellulitis in the left leg posteriorly. I put him on amoxicillin this really hasn't helped as much as I might like. He is also worried because he is traveling to [facility] trauma by car. Finally we will be rewrapping him. There is no open area on the right leg over his left leg has multiple weeping areas as usual 02/09/18; The same wrap on for 10 days. He did not pick up the last doxycycline I prescribed for him. He apparently took 4 days worth he already had. There is nothing open on his right leg and the edema control is really  quite good. He's had damage in the left leg medially and laterally especially probably related to the prolonged use of Unna boots 02/12/18; the patient arrived in clinic today for a nurse visit/wrap change. He complained of a lot of pain in the left posterior calf. He is taking doxycycline that I previously prescribed for him. Unfortunately even though he used his stockings and apparently used to compression pumps twice a day he has weeping edema coming out of the lateral part of his right leg. This is coming from the lower anterior lateral skin area. 02/16/18; the patient has finished his doxycycline and will finish the amoxicillin 2 days. The area of cellulitis in the left calf posteriorly has resolved. He is no longer having any pain. He tells me he is using his compression pumps at least once a day sometimes twice. 02/23/18; the patient finished his doxycycline and Amoxil last week. On Friday he noticed a small erythematous circle about the size of a quarter on the left lower leg just above his ankle. This rapidly expanded and he now has erythema on the lateral and posterior part of the thigh. This is bright red. Also has an area on the dorsal foot just above his toes and a tender area just below the left popliteal fossa. He came off his prophylactic penicillin injections at his own insistence one or 2 months ago. This is obviously deteriorated since then 03/02/18; patient is on doxycycline and Amoxil. Culture I did last week of the weeping area on the back of his left calf grew group B strep. I have therefore renewed the amoxicillin 500 3 times a day for a further week. He has not been systemically unwell. Still complaining of an area of discomfort right under his left popliteal fossa. There is no open wound on the right leg. He tells me that he is using his pumps twice a day on most days 03/09/18; patient arrives in clinic today completing  his amoxicillin today. The cellulitis on his left leg is  better. Furthermore he tells me that he had intramuscular penicillin shots that his primary care office today. However he also states that the wrap on his right leg fell down shortly after leaving clinic last week. He developed a large blister that was present when he came in for a nurse visit later in the week and then he developed intense discomfort around this area.He tells me he is using his compression pumps 03/16/18; the patient has completed his doxycycline. The infectious part of this/cellulitis in the left heel area left popliteal area is a lot better. He has 2 open areas on the right calf. Still areas on the left calf but this is a lot better as well. 03/24/18; the patient arrives complaining of pain in the left popliteal area again. He thinks some of this is wrap injury. He has no open area on the right leg and really no open area on the left calf either except for the popliteal area. He claims to be compliant with the compression pumps 03/31/18; I gave him doxycycline last week because of cellulitis in the left popliteal area. This is a lot better although the surface epithelium is denuded off and response to this. He arrives today with uncontrolled edema in the right calf area as well as a fingernail injury in the right lateral calf. There is only a few open areas on the left 04/06/18; I gave him amoxicillin doxycycline over the last 2 weeks that the amoxicillin should be completing currently. He is not complaining of any pain or systemic symptoms. The only open areas see has is on the right lateral lower leg paradoxically I cannot see anything on the left lower leg. He tells me he is using his compression pumps twice a day on most days. Silver alginate to the wounds that are open under 4 layer compression 04/13/18; he completed antibiotics and has no new complaints. Using his compression pumps. Silver alginate that anything that's opened 04/20/18; he is using his compression pumps religiously.  Silver alginate 4 layer compression anything that's opened. He comes in today with no open wounds on the left leg but 3 on the right including a new one posteriorly. He has 2 on the right lateral and one on the right posterior. He likes Unna boots on the right leg for reasons that aren't really clear we had the usual 4 layer compression on the left. It may be necessary to move to the 4 layer compression on the right however for now I left them in the Unna boots 04/27/18; he is using his compression pumps at least once a day. He has still the wounds on the right lateral calf. The area right posteriorly has closed. He does not have an open wound on the left under 4 layer compression however on the dorsal left foot just proximal to the toes and the left third toe 2 small open areas were identified 05/11/18; he has not uses compression pumps. The areas on the right lateral calf have coalesced into one large wound necrotic surface. On the left side he has one small wound anteriorly however the edema is now weeping out of a large part of his left leg. He says he wasn't using his pumps because of the weeping fluid. I explained to him that this is the time he needs to pump more 05/18/18; patient states he is using his compression pumps twice a day. The area on the right lateral  large wound albeit superficial. On the left side he has innumerable number of small new wounds on the left calf particularly laterally but several anteriorly and medially. All these appear to have healthy granulated base these look like the remnants of blisters however they occurred under compression. The patient arrives in clinic today with his legs somewhat better. There is certainly less edema, less multiple open areas on the left calf and the right anterior leg looks somewhat better as well superficial and a little smaller. However he relates pain and erythema over the last 3-4 days in the thigh and I looked at this today. He has not  been systemically unwell no fever no chills no change in blood sugar values 05/25/18; comes in today in a better state. The severe cellulitis on his left leg seems better with the Keflex. Not as tender. He has not been systemically unwell Hard to find an open wound on the left lower leg using his compression pumps twice a day The confluent wounds on his right lateral calf somewhat better looking. These will ultimately need debridement I didn't do this today. 06/01/18; the severe cellulitis on the left anterior thigh has resolved and he is completed his Keflex. There is no open wound on the left leg however there is a superficial excoriation at the base of the third toe dorsally. Skin on the bottom of his left foot is macerated looking. The left the wounds on the lateral right leg actually looks some better although he did require debridement of the top half of this wound area with an open curet 06/09/18 on evaluation today patient appears to be doing poorly in regard to his right lower extremity in particular this appears to likely be infected he has very thick purulent discharge along with a bright green tent to the discharge. This makes me concerned about the possibility of pseudomonas. He's also having increased discomfort at this point on evaluation. Fortunately there does not appear to be any evidence of infection spreading to the other location at this time. 06/16/18 on evaluation today patient appears to actually be doing fairly well. His ulcer has actually diminished in size quite significantly at this point which is good news. Nonetheless he still does have some evidence of infection he did see infectious disease this morning before coming here for his appointment. I did review the results of their evaluation and their note today. They did actually have him discontinue the Cipro and initiate treatment with linezolid at this time. He is doing this for the next seven days and they recommended a  follow-up in four months with them. He is the keep a log of the need for intermittent antibiotic therapy between now and when he falls back up with infectious disease. This will help them gaze what exactly they need to do to try and help them out. 06/23/18; the patient arrives today with no open wounds on the left leg and left third toe healed. He is been using his compression pumps twice a day. On the right lateral leg he still has a sizable wound but this is a lot better than last time I saw this. 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 Has been using silver alginate under 4 layer compression 06/30/18; the only open wound he has is on the right lateral leg and this looks healthy. No debridement is required. We have been using silver  alginate. He does not have an open wound on the left leg. There is apparently some drainage from the dorsal proximal third toe on the left although I see no open wound here. 07/03/18 on evaluation today patient was actually here just for a nurse visit rapid change. However when he was here on Wednesday for his rat change due to having been healed on the left and then developing blisters we initiated the wrap again knowing that he would be back today for [place] to reevaluate and see were at. Unfortunately he has developed some cellulitis into the proximal portion of his right lower extremity even into the region of his thigh. 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 He was placed on one as what at that point. Nonetheless he is done with that and has been tolerating it well otherwise. Doxycycline which in the past really did not seem to be effective for him. Nonetheless I think the best option may be for [place] to definitely reinitiate the antibiotics for a longer period of time. 07/07/18; since I last saw this patient a week ago he has  had a difficult time. At that point he did not have an open wound on his left leg. We transitioned him into juxta light stockings. He was apparently in the clinic the next day with blisters on the left lateral and left medial lower calf. He also had weeping edema fluid. He was put back into a compression wrap. He was also in the clinic on Friday with intense erythema in his right thigh. Per the patient he was started on Bactrim however that didn't work at all in terms of relieving his pain and swelling. He has taken 3 doxycycline that he had left over from last time and that seems to of helped. He has blistering on the right thigh as well. 07/14/18; the erythema on his right thigh has gotten better with doxycycline that he is finishing. 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 Presumably this cellulitis in the thigh was not related to that although I think this is a bit concerning going forward. He still has an area on the right lateral calf the blister on the right medial calf just below the knee that was discussed above. On the left 2 small open areas left medial and left lateral. Edema control is adequate. He is using his compression pumps twice a day 07/20/18; continued improvement in the condition of both legs especially the edema in his bilateral thighs. He tells me he is been losing weight through a combination of diet and exercise. He is using his compression pumps twice a day. So overall she made to the remaining wounds 07/27/2018; continued improvement in condition of both legs. His edema is well controlled. The area on the right lateral leg is just about closed he had one blisters show up on the medial left upper calf. We have him in 4 layer compression. He is going on a 10-day trip to [place], [place] and [place]. He will be driving. He wants to wear Unna boots because of the lessening amount of constriction.  He will not use compression pumps while he is away 08/05/18 on evaluation today patient actually appears to be doing decently well all things considered in regard to his bilateral lower extremities. The worst ulcer is actually only posterior aspect of his left lower  extremity with a four layer compression wrap cut into his leg a couple weeks back. He did have a trip and actually had [employer] for the trip that he is worn since he was last here. Nonetheless he feels like the [employer] actually do better for him his swelling is up a little bit but he also with his trip was not taking his Lasix on a regular set schedule like he was supposed to be. He states that obviously the reason being that he cannot drive and keep going without having to urinate too frequently which makes it difficult. He did not have his pumps with him while he was away either which I think also maybe playing a role here too. 08/13/2018; the patient only has a small open wound on the right lateral calf which is a big improvement in the last month or 2. He also has the area posteriorly just below the posterior fossa on the left which I think was a wrap injury from several weeks ago. He has no current evidence of cellulitis. He tells me he is back into his compression pumps twice a day. He also tells me that while he was at the laundromat somebody stole a section of his extremitease stockings 08/20/2018; back in the clinic with a much improved state. He only has small areas on the right lateral mid calf which is just about healed. This was is more substantial area for quite a prolonged period of time. He has a small open area on the left anterior tibia. The area on the posterior calf just below the popliteal fossa is closed today. He is using his compression pumps twice a day 08/28/2018; patient has no open wound on the right leg. He has a smattering of open areas on the calf with some weeping lymphedema. More  problematically than that it looks as though his wraps of slipped down in his usual he has very angry upper area of edema just below the right medial knee and on the right lateral calf. He has no open area on his feet. The patient is traveling to [facility] next week. I will send him in an antibiotic. We will continue to wrap the right leg. We ordered extremitease stockings for him last week and I plan to transition the right leg to a stocking when he gets home which will be in 10 days time. As usual he is very reluctant to take his pumps with him when he travels 09/07/2018; patient returns from [facility]. He shows me a picture of his left leg in the mid part of his trip last week with intense fire engine erythema. The picture look bad enough I would have considered sending him to the hospital. Instead he went to the wound care center in [facility] They did not prescribe him antibiotics but he did take some doxycycline he had leftover from a previous visit. I had given him trimethoprim sulfamethoxazole before he left this did not work according to the patient. This is resulted in some improvement fortunately. He comes back with a large wound on the left posterior calf. Smaller area on the left anterior tibia. Denuded blisters on the dorsal left foot over his toes. Does not have much in the way of wounds on the right leg although he does have a very tender area on the right posterior area just below the popliteal fossa also suggestive of infection. He promises me he is back on his pumps twice a  day 09/15/2018; the intense cellulitis in his left lower calf is a lot better. The wound area on the posterior left calf is also so better. However he has reasonably extensive wounds on the dorsal aspect of his second and third toes and the proximal foot just at the base of the toes. There is nothing open on the right leg 09/22/2018; the patient has excellent edema control in his legs  bilaterally. He is using his external compression pumps twice a day. He has no open area on the right leg and only the areas in the left foot dorsally second and third toe area on the left side. He does not have any signs of active cellulitis. 10/06/2018; the patient has good edema control bilaterally. He has no open wound on the right leg. There is a blister in the posterior aspect of his left calf that we had to deal with today. He is using his compression pumps twice a day. There is no signs of active cellulitis. We have been using silver alginate to the wound areas. He still has vulnerable areas on the base of his left first second toes dorsally He has a his extremities stockings and we are going to transition him today into the stocking on the right leg. He is cautioned that he will need to continue to use the compression pumps twice a day. If he notices uncontrolled edema in the right leg he may need to go to 3 times a day. 10/13/2018; the patient came in for a nurse check on Friday he has a large flaccid blister on the right medial calf just below the knee. We unroofed this. He has this and a new area underneath the posterior mid calf which was undoubtedly a blister as well. He also has several small areas on the right which is the area we put his extremities stocking on. 10/19/2018; the patient went to see infectious disease this morning I am not sure if that was a routine follow-up in any case the doxycycline I had given him was discontinued and started on linezolid. He has not started this. It is easy to look at his left calf and the inflammation and think this is cellulitis however he is very tender in the tissue just below the popliteal fossa and I have no doubt that there is infection going on here. He states the problem he is having is that with the compression pumps the edema goes down and then starts walking the wrap falls down. We will see if we can adhere this. He has 1 or 2 minuscule  open areas on the right still areas that are weeping on the posterior left calf, the base of his left second and third toes 10/26/18; back today in clinic with quite of skin breakdown in his left anterior leg. This may have been infection the area below the popliteal fossa seems a lot better however tremendous epithelial loss on the left anterior mid tibia area over quite inexpensive tissue. He has 2 blisters on the right side but no other open wound here. 10/29/2018; came in urgently to see [place] today and we worked him in for review. He states that the 4 layer compression on the right leg caused pain he had to cut it down to roughly his mid calf this caused swelling above the wrap and he has blisters and skin breakdown today. As a result of the pain he has not been using his pumps. Both legs are a lot more edematous and there  is a lot of weeping fluid. 11/02/18; arrives in clinic with continued difficulties in the right leg> left. Leg is swollen and painful. multiple skin blisters and new open areas especially laterally. He has not been using his pumps on the right leg. 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 He is not systemically unwell. 11/09/2018; the patient claims he is being compliant with his pumps. He is finished the doxycycline I gave him last week. Culture I did of the wound on the right lateral leg showed a few very resistant methicillin staph aureus. This was resistant to doxycycline. Nevertheless he states the pain in the leg is a lot better which makes me wonder if the cultured organism was not really what was causing the problem nevertheless this is a very dangerous organism to be culturing out of any wound. His right leg is still a lot larger than the left. He is using an Radio broadcast assistant on this area, he blames a 4-layer compression for causing the original skin breakdown which I doubt is true however I cannot talk him out of it. We have been  using silver alginate to all of these areas which were initially blisters 11/16/2018; patient is being compliant with his external compression pumps at twice a day. Miraculously he arrives in clinic today with absolutely no open wounds. He has better edema control on the left where he has been using 4 layer compression versus wound of wounds on the right and I pointed this out to him. There is no inflammation in the skin in his lower legs which is also somewhat unusual for him. There is no open wounds on the dorsal left foot. He has extremitease stockings at home and I have asked him to bring these in next week. 11/25/18 patient's lower extremity on examination today on the left appears for the most part to be wound free. He does have an open wound on the lateral aspect of the right lower extremity but this is minimal compared to what I've seen in past. He does request that we go ahead and wrap the left leg as well even though there's nothing open just so hopefully it will not reopen in short order. 1/28; patient has superficial open wounds on the right lateral calf left anterior calf and left posterior calf. His edema control is adequate. 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 We have been using silver alginate as the primary dressing. He claims compliance with his compression pumps 2/4; patient has superficial open wounds on numerous areas of his left calf and again one on the left dorsal foot. The areas on the right lateral calf have healed. The cellulitis that I gave him doxycycline for last week is also resolved this was mostly on the left anterior calf just below the tibial tuberosity. His edema looks fairly well-controlled. He tells me he went to see his primary doctor today and had blood work ordered 2/11; once again he has several open areas on the left calf left tibial area. Most of these are small and  appear to have healthy granulation. He does not have anything open on the right. The edema and control in his thighs is pretty good which is usually a good indication he has been using his pumps as requested. 2/18; he continues to have several small areas on the left calf and left tibial area. Most of  these are small healthy granulation. We put him in his stocking on the right leg last week and he arrives with a superficial open area over the right upper tibia and a fairly large area on the right lateral tibia in similar condition. His edema control actually does not look too bad, he claims to be using his compression pumps twice a day 2/25. Continued small areas on the left calf and left tibial area. New areas especially on the right are identified just below the tibial tuberosity and on the right upper tibia itself. There are also areas of weeping edema fluid even without an obvious wound. He does not have a considerable degree of lymphedema but clearly there is more edema here than his skin can handle. He states he is using the pumps twice a day. We have an Unna boot on the right and 4 layer compression on the left. 3/3; he continues to have an area on the right lateral calf and right posterior calf just below the popliteal fossa. There is a fair amount of tenderness around the wound on the popliteal fossa but I did not see any evidence of cellulitis, could just be that the wrap came down and rubbed in this area. He does not have an open area on the left leg however there is an area on the left dorsal foot at the base of the third toe We have been using silver alginate to all wound areas 3/10; he did not have an open area on his left leg last time he was here a week ago. T oday he arrives with a horizontal wound just below the tibial tuberosity and an area on the left lateral calf. He has intense erythema and tenderness in this area. The area is on the right lateral calf and right posterior calf  better than last week. We have been using silver alginate as usual 3/18 - Patient returns with 3 small open areas on left calf, and 1 small open area on right calf, the skin looks ok with no significant erythema, he continues the UNA boot on right and 4 layer compression on left. The right lateral calf wound is closed , the right posterior is small area. we will continue silver alginate to the areas. 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 He is been tolerating the dressing changes without complication. Fortunately she has made excellent improvement in regard to the overall status of his wounds. Unfortunately every time we cease wrapping him he ends up reopening in causing more significant issues at that point. Again I'm unsure of the best direction to take although I think the lymphedema clinic may be appropriate for him. 02/03/19 on evaluation today patient appears to be doing well in regard to the wounds that we saw him for last week unfortunately he has a new area on the proximal portion of his right medial/posterior lower extremity where the wrap somewhat slowed down and caused swelling and a blister to rub and open. Unfortunately this is the only opening that he has on either leg at this point. 02/17/19 on evaluation today patient's bilateral lower extremities appear to be doing well. He still completely healed in regard to the left lower extremity. In regard to the right lower extremity the area where the wrap and slid down and caused the  blister still seems to be slightly open although this is dramatically better than during the last evaluation two weeks ago. I'm very pleased with the way this stands overall. 03/03/19 on evaluation today patient appears to be doing well in regard to his right lower  extremity in general although he did have a new blister open this does not appear to be showing any evidence of active infection at this time. Fortunately there's No fevers, chills, nausea, or vomiting noted at this time. Overall I feel like he is making good progress it does feel like that the right leg will we perform the [employer] seems to do with a bit better than three layer wrap on the left which slid down on him. We may switch to doing bilateral in the book wraps. 5/4; I have not seen Mr. Kia in quite some time. According to our case manager he did not have an open wound on his left leg last week. He had 1 remaining wound on the right posterior medial calf. He arrives today with multiple openings on the left leg probably were blisters and/or wrap injuries from Unna boots. I do not think the Unna boot's will provide adequate compression on the left. I am also not clear about the frequency he is using the compression pumps. 03/17/19 on evaluation today patient appears to be doing excellent in regard to his lower extremities compared to last week's evaluation apparently. He had gotten significantly worse last week which is unfortunate. The [employer] wrap on the left did not seem to do very well for him at all and in fact it didn't control his swelling significantly enough he had an additional outbreak. Subsequently we go back to the four layer compression wrap on the left. This is good news. At least in that he is doing better and the wound seem to be killing him. He still has not heard anything from the lymphedema clinic. 03/24/19 on evaluation today patient actually appears to be doing much better in regard to his bilateral lower Trinity as compared to last week when I saw him. Fortunately there's no signs of active infection at this time. He has been tolerating the dressing changes without complication. Overall I'm extremely pleased with the progress and appearance in general. 04/07/19 on  evaluation today patient appears to be doing well in regard to his bilateral lower extremities. His swelling is significantly down from where it was previous. With that being said he does have a couple blisters still open at this point but fortunately nothing that seems to be too severe and again the majority of the larger openings has healed at this time. 04/14/19 on evaluation today patient actually appears to be doing quite well in regard to his bilateral lower extremities in fact I'm not even sure there's anything significantly open at this time at any site. Nonetheless he did have some trouble with these wraps where they are somewhat irritating him secondary to the fact that he has noted that the graph wasn't too close down to the end of this foot in a little bit short as well up to his knee. Otherwise things seem to be doing quite well. 04/21/19 upon evaluation today patient's wound bed actually showed evidence of being completely healed in regard to both lower extremities which is excellent news. There does not appear to be any signs of active infection which is also good news. I'm very pleased in this regard. No fevers, chills, nausea, or vomiting noted at this time.  04/28/19 on evaluation today patient appears to be doing a little bit worse in regard to both lower extremities on the left mainly due to the fact that when he went infection disease the wrap was not wrapped quite high enough he developed a blister above this. On the right he is a small open area of nothing too significant but again this is continuing to give him some trouble he has been were in the Velcro compression that he has at home. 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 He's been tolerating the dressing changes without complication. Fortunately there's no signs of active infection at this time. No fevers, chills, nausea, or vomiting noted at this time. We have  been trying to get an appointment with her lymphedema clinic in [facility] [facility] but unfortunately nobody can get them on phone with not been able to even fax information over the patient likewise is not been able to get in touch with them. Overall I'm not sure exactly what's going on here with to reach out again today. 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 Still having a lot of drainage unfortunately. He tells me especially in the left but even on the right. There's no signs of active infection which is good news we've been using so ratcheted up to this point. 05/19/19 on evaluation today patient actually appears to be doing quite well with regard to his left lower extremity which is great news. Fortunately in regard to the right lower extremity has an issues with his wrap and he subsequently did remove this from what I'm understanding. Nonetheless long story short is what he had rewrapped once he removed it subsequently had maggots underneath this wrap whenever he came in for evaluation today. With that being said they were obviously completely cleaned away by the nursing staff. The visit today which is excellent news. However he does appear to potentially have some infection around the right ankle region where the maggots were located as well. He will likely require anabiotic therapy today. 05/26/19 on evaluation today patient actually appears to be doing much better in regard to his bilateral lower extremities. I feel like the infection is under much better control. With that being said there were maggots noted when the wrap was removed yet again today. Again this could have potentially been left over from previous although at this time there does not appear to be any signs of significant drainage there was obviously on the wrap some drainage as well this contracted gnats or otherwise. Either way I do  not see anything that appears to be doing worse in my pinion and in fact I think his drainage has slowed down quite significantly likely mainly due to the fact to his infection being under better control. 06/02/2019 on evaluation today patient actually appears to be doing well with regard to his bilateral lower extremities there is no signs of active infection at this time which is great news. With that being said he does have several open areas more so on the right than the left but nonetheless these are all significantly better than previously noted. 06/09/2019 on evaluation today patient actually appears to be doing well. His wrap stayed up and he did not cause any problems he had more drainage on the right compared to the left but overall I do not see any major issues  at this time which is great news. 06/16/2019 on evaluation today patient appears to be doing excellent with regard to his lower extremities the only area that is open is a new blister that can have opened as of today on the medial ankle on the left. Other than this he really seems to be doing great I see no major issues at this point. 06/23/2019 on evaluation today patient appears to be doing quite well with regard to his bilateral lower extremities. In fact he actually appears to be almost completely healed there is a small area of weeping noted of the right lower extremity just above the ankle. Nonetheless fortunately there is no signs of active infection at this time which is good news. No fevers, chills, nausea, vomiting, or diarrhea. 8/24; the patient arrived for a nurse visit today but complained of very significant pain in the left leg and therefore I was asked to look at this. Noted that he did not have an open area on the left leg last week nevertheless this was wrapped. The patient states that he is not been able to put his compression pumps on the left leg because of the discomfort. He has not been systemically unwell 06/30/2019  on evaluation today patient unfortunately despite being excellent last week is doing much worse with regard to his left lower extremity today. In fact he had to come in for a nurse on Monday where his left leg had to be rewrapped due to excessive weeping Dr. Leanord Hawking placed him on doxycycline at that point. Fortunately there is no signs of active infection Systemically at this time which is good news. 07/07/2019 in regard to the patient's wounds today he actually seems to be doing well with his right lower extremity there really is nothing open or draining at this point this is great news. Unfortunately the left lower extremity is given him additional trouble at this time. There does not appear to be any signs of active infection nonetheless he does have a lot of edema and swelling noted at this point as well as blistering all of which has led to a much more poor appearing leg at this time compared to where it was 2 weeks ago when it was almost completely healed. Obviously this is a little discouraging for the patient. He is try to contact the lymphedema clinic in [place] he has not been able to get through to them. 07/14/2019 on evaluation today patient actually appears to be doing slightly better with regard to his left lower extremity ulcers. Overall I do feel like at least at the top of the wrap that we have been placing this area has healed quite nicely and looks much better. The remainder of the leg is showing signs of improvement. Unfortunately in the thigh area he still has an open region on the left and again on the right he has been utilizing just a Band-Aid on an area that also opened on the thigh. Again this is an area that were not able to wrap although we did do an Ace wrap to provide some compression that something that obviously is a little less effective than the compression wraps we have been using on the lower portion of the leg. He does have an appointment with the lymphedema clinic  in [facility] on Friday. 07/21/2019 on evaluation today patient appears to be doing better with regard to his lower extremity ulcers. He has been tolerating the dressing changes without complication. Fortunately there is no signs of  active infection at this time. No fevers, chills, nausea, vomiting, or diarrhea. I did receive the paperwork from the physical therapist at the lymphedema clinic in [place]. Subsequently I signed off on that this morning and sent that back to him for further progression with the treatment plan. 07/28/2019 on evaluation today patient appears to be doing very well with regard to his right lower extremity where I do not see any open wounds at this point. Fortunately he is feeling great as far as that is concerned as well. In regard to the left lower extremity he has been having issues with still several areas of weeping and edema although the upper leg is doing better his lower leg still I think is going require the compression wrap at this time. No fevers, chills, nausea, vomiting, or diarrhea. 08/04/2019 on evaluation today patient unfortunately is having new wounds on the right lower extremity. Again we have been using Unna boot wrap on that side. We switched him to using his juxta lite wrap at home. With that being said he tells me he has been using it although his legs extremely swollen and to be honest really does not appear that he has been. I cannot know that for sure however. Nonetheless he has multiple new wounds on the right lower extremity at this time. Obviously we will have to see about getting this rewrapped for him today. 08/11/2019 on evaluation today patient appears to be doing fairly well with regard to his wounds. He has been tolerating the dressing changes including the compression wraps without complication. He still has a lot of edema in his upper thigh regions bilaterally he is supposed to be seeing the lymphedema clinic on the 15th of this month  once his wraps arrive for the upper part of his legs. 08/18/2019 on evaluation today patient appears to be doing well with regard to his bilateral lower extremities at this point. He has been tolerating the dressing changes without complication. Fortunately there is no signs of active infection which is also good news. He does have a couple weeping areas on the first and second toe of the right foot he also has just a small area on the left foot upper leg and a small area on the left lower leg but overall he is doing quite well in my opinion. He is supposed to be getting his wraps shortly in fact tomorrow and then subsequently is seeing the lymphedema clinic next Wednesday on the 21st. Of note he is also leaving on the 25th to go on vacation for a week to the beach. For that reason and since there is some uncertainty about what there can be doing at lymphedema clinic next Wednesday I am get a make an appointment for next Friday here for [place] to see what we need to do for him prior to him leaving for vacation. 10/23; patient arrives in considerable pain predominantly in the upper posterior calf just distal to the popliteal fossa also in the wound anteriorly above the major wound. This is probably cellulitis and he has had this recurrently in the past. He has no open wound on the right side and he has had an Radio broadcast assistant in that area. Finally I note that he has an area on the left posterior calf which by enlarge is mostly epithelialized. This protrudes beyond the borders of the surrounding skin in the setting of dry scaly skin and lymphedema. The patient is leaving for [facility] on Sunday. Per his longstanding pattern,  he will not take his compression pumps with him predominantly out of fear that they will be stolen. He therefore asked that we put a Unna boot back on the right leg. He will also contact the wound care center in [facility] to see if they can change his dressing in the mid week. 11/3;  patient returned from his vacation to [facility]. He was seen on 1 occasion at their wound care center. They did a 2 layer compression system as they did not have our 4-layer wrap. I am not completely certain what they put on the wounds. They did not change the Unna boot on the right. The patient is also seeing a lymphedema specialist physical therapist in [place]. It appears that he has some compression sleeve for his thighs which indeed look quite a bit better than I am used to seeing. He pumps over these with his external compression pumps. 11/10; the patient has a new wound on the right medial thigh otherwise there is no open areas on the right. He has an area on the left leg posteriorly anteriorly and medially and an area over the left second toe. We have been using silver alginate. He thinks the injury on his thigh is secondary to friction from the compression sleeve he has. 11/17; the patient has a new wound on the right medial thigh last week. He thinks this is because he did not have a underlying stocking for his thigh juxta lite apparatus. He now has this. The area is fairly large and somewhat angry but I do not think he has underlying cellulitis. He has a intact blister on the right anterior tibial area. Small wound on the right great toe dorsally Small area on the medial left calf. 11/30; the patient does not have any open areas on his right leg and we did not take his juxta lite stocking off. However he states that on Friday his compression wrap fell down lodging around his upper mid calf area. As usual this creates a lot of problems for him. He called urgently today to be seen for a nurse visit however the nurse visit turned into a provider visit because of extreme erythema and pain in the left anterior tibia extending laterally and posteriorly. The area that is problematic is extensive 10/06/2019 upon evaluation today patient actually appears to be doing poorly in regard to his  left lower extremity. He Dr. Leanord Hawking did place him on doxycycline this past Monday apparently due to the fact that he was doing much worse in regard to this left leg. Fortunately the doxycycline does seem to be helping. Unfortunately we are still having a very difficult time getting his edema under any type of control in order to anticipate discharge at some point. The only way were really able to control his lymphedema really is with compression wraps and that has only even seemingly temporary. He has been seeing a lymphedema clinic they are trying to help in this regard but still this has been somewhat frustrating in general for the patient. 10/13/19 on evaluation today patient appears to be doing excellent with regard to his right lower extremity as far as the wounds are concerned. His swelling is still quite extensive unfortunately. He is still having a lot of drainage from the thigh areas bilaterally which is unfortunate. He's been going to lymphedema clinic but again he still really does not have this edema under control as far as his lower extremities are concern. With regard to his left  lower extremity this seems to be improving and I do believe the doxycycline has been of benefit for him. He is about to complete the doxycycline. 10/20/2019 on evaluation today patient appears to be doing poorly in regard to his bilateral lower extremities. More in the right thigh he has a lot of irritation at this site unfortunately. In regard to the left lower extremity the wrap was not quite as high it appears and does seem to have caused him some trouble as well. Fortunately there is no evidence of systemic infection though he does have some blue-green drainage which has me concerned for the possibility of Pseudomonas. He tells me he is previously taking Cipro without complications and he really does not care for Levaquin however due to some of the side effects he has. He is not allergic to any medications  specifically antibiotics that were aware of. 10/27/2019 on evaluation today patient actually does appear to be for the most part doing better when compared to last week's evaluation. With that being said he still has multiple open wounds over the bilateral lower extremities. He actually forgot to start taking the Cipro and states that he still has the whole bottle. He does have several new blisters on left lower extremity today I think I would recommend he go ahead and take the Cipro based on what I am seeing at this point. 12/30-Patient comes at 1 week visit, 4 layer compression wraps on the left and Unna boot on the right, primary dressing Xtrasorb and silver alginate. Patient is taking his Cipro and has a few more days left probably 5-6, and the legs are doing better. He states he is using his compressions devices which I believe he has 11/10/2019 on evaluation today patient actually appears to be much better than last time I saw him 2 weeks ago. His wounds are significantly improved and overall I am very pleased in this regard. Fortunately there is no signs of active infection at this time. He is just a couple of days away from completing Cipro. Overall his edema is much better he has been using his lymphedema pumps which I think is also helping at this point. 11/17/2019 on evaluation today patient appears to be doing excellent in regard to his wounds in general. His legs are swollen but not nearly as much as they have been in the past. Fortunately he is tolerating the compression wraps without complication. No fevers, chills, nausea, vomiting, or diarrhea. He does have some erythema however in the distal portion of his right lower extremity specifically around the forefoot and toes there is a little bit of warmth here as well. 11/24/2019 on evaluation today patient appears to be doing well with regard to his right lower extremity I really do not see any open wounds at this point. His left lower  extremity does have several open areas and his right medial thigh also is open. Other than this however overall the patient seems to be making good progress and I am very pleased at this point. 12/01/2019 on evaluation today patient appears to be doing poorly at this point in regard to his left lower extremity has several new blisters despite the fact that we have him in compression wraps. In fact he had a 4-layer compression wrap, his upper thigh wrapped from lymphedema clinic, and a juxta light over top of the 4 layer compression wrap the lymphedema clinic applied and despite all this he still develop blisters underneath. Obviously this does have me concerned  about the fact that unfortunately despite what we are doing to try to get wounds healed he continues to have new areas arise I do not think he is ever good to be at the point where he can realistically just use wraps at home to keep things under control. Typically when we heal him it takes about 1-2 days before he is back in the clinic with severe breakdown and blistering of his lower extremities bilaterally. This is happened numerous times in the past. Unfortunately I think that we may need some help as far as overall fluid overload to kind of limit what we are seeing and get things under better control. 12/08/2019 on evaluation today patient presents for follow-up concerning his ongoing bilateral lower extremity edema. Unfortunately he is still having quite a bit of swelling the compression wraps are controlling this to some degree but he did see Dr. [name] his cardiologist I do have that available for review today as far as the appointment was concerned that was on 12/06/2019. Obviously that she has been 2 days ago. The patient states that he is only been taking the Lasix 80 mg 1 time a day he had told me previously he was taking this twice a day. Nonetheless Dr. [name] recommended this be up to 80 mg 2 times a day for the patient as he did appear to  be fluid overloaded. With that being said the patient states he did this yesterday and he was unable to go anywhere or do anything due to the fact that he was constantly having to urinate. Nonetheless I think that this is still good to be something that is important for him as far as trying to get his edema under control at all things that he is going to be able to just expect his wounds to get under control and things to be better without going through at least a period of time where he is trying to stabilize his fluid management in general and I think increasing the Lasix is likely the first step here. It was also mentioned the possibility that the patient may require metolazone. With that being said he wanted to have the patient take Lasix twice a day first and then reevaluating 2 months to see where things stand. 12/15/2019 upon evaluation today patient appears to be doing regard to his legs although his toes are showing some signs of weeping especially on the left at this point to some degree on the right. There does not appear to be any signs of active infection and overall I do feel like the compression wraps are doing well for him but he has not been able to take the Lasix at home and the increased dose that Dr. [name] recommended. He tells me that just not go to be feasible for him. Nonetheless I think in this case he should probably send a message to Dr. [name] in order to discuss options from the standpoint of possible admission to get the fluid off or otherwise going forward. 12/22/2019 upon evaluation today patient appears to be doing fairly well with regard to his lower extremities at this point. In fact he would be doing excellent if it was not for the fact that his right anterior thigh apparently had an allergic reaction to adhesive tape that he used. The wound itself that we have been monitoring actually appears to be healed. There is a lot of irritation at this point. 12/29/2019 upon  evaluation today patient appears to be doing well  in regard to his lower extremities. His left medial thigh is open and somewhat draining today but this is the only region that is open the right has done much better with the treatment utilizing the steroid cream that I prescribed for him last week. Overall I am pleased in that regard. Fortunately there is no signs of active infection at this time. No fevers, chills, nausea, vomiting, or diarrhea. 01/05/2020 upon evaluation today patient appears to be doing more poorly in regard to his right lower extremity at this point upon evaluation today. Unfortunately he continues to have issues in this regard and I think the biggest issue is controlling his edema. This obviously is not very well controlled at this point is been recommended that he use the Lasix twice a day but he has not been able to do that. Unfortunately I think this is leading to an issue where honestly he is not really able to effectively control his edema and therefore the wounds really are not doing significantly better. I do not think that he is going to be able to keep things under good control unless he is able to control his edema much better. I discussed this again in great detail with him today. 01/12/2020 good news is patient actually appears to be doing quite well today at this point. He does have an appointment with lymphedema clinic tomorrow. His legs appear healed and the toe on the left is almost completely healed. In general I am very pleased with how things stand at this point. 01/19/2020 upon evaluation today patient appears to actually be doing well in regard to his lower extremities there is nothing open at this point. Fortunately he has done extremely well more recently. Has been seeing lymphedema clinic as well. With that being said he has Velcro wraps for his lower legs as well as his upper legs. The only wound really is on his toe which is the right great toe and this is  barely anything even there. With all that being said I think it is good to be appropriate today to go ahead and switch him over to the Velcro compression wraps. 01/26/2020 upon evaluation today patient appears to be doing worse with regard to his lower extremities after last week switch him to Velcro compression wraps. Unfortunately he lasted less than 24 hours he did not have the sock portion of his Velcro wrap on the left leg and subsequently developed a blister underneath the Velcro portion. Obviously this is not good and not what we were looking for at this point. He states the lymphedema clinic did tell him to wear the wrap for 23 hours and take him off for 1 I am okay with that plan but again right now we got a get things back under control again he may have some cellulitis noted as well. 02/02/2020 upon evaluation today patient unfortunately appears to have several areas of blistering on his bilateral lower extremities today mainly on the feet. His legs do seem to be doing somewhat better which is good news. Fortunately there is no evidence of active infection at this time. No fevers, chills, nausea, vomiting, or diarrhea. 02/16/2020 upon evaluation today patient appears to be doing well at this time with regard to his legs. He has a couple weeping areas on his toes but for the most part everything is doing better and does appear to be sealed up on his legs which is excellent news. We can continue with wrapping him at this point  as he had every time we discontinue the wraps he just breaks out with new wounds. There is really no point in is going forward with this at this point. 03/08/2020 upon evaluation today patient actually appears to be doing quite well with regard to his lower extremity ulcers. He has just a very superficial and really almost nonexistent blister on the left lower extremity he has in general done very well with the compression wraps. With that being said I do not see any signs  of infection at this time which is good news. 03/29/2020 upon evaluation today patient appears to be doing well with regard to his wounds currently except for where he had several new areas that opened up due to some of the wrap slipping and causing him trouble. He states he did not realize they had slipped. Nonetheless he has a 1 area on the right and 3 new areas on the left. Fortunately there is no signs of active infection at this time which is great news. 04/05/2020 upon evaluation today patient actually appears to be doing quite well in general in regard to his legs currently. Fortunately there is no signs of active infection at this time. No fevers, chills, nausea, vomiting, or diarrhea. He tells me next week that he will actually be seen in the lymphedema clinic on Thursday at 10 AM I see him on Wednesday next week. 04/12/2020 upon evaluation today patient appears to be doing very well with regard to his lower extremities bilaterally. In fact he does not appear to have any open wounds at this point which is good news. Fortunately there is no signs of active infection at this time. No fevers, chills, nausea, vomiting, or diarrhea. 04/19/2020 upon evaluation today patient appears to be doing well with regard to his wounds currently on the bilateral lower extremities. There does not appear to be any signs of active infection at this time. Fortunately there is no evidence of systemic infection and overall very pleased at this point. Nonetheless after I held him out last week he literally had blisters the next morning already which swelled up with him being right back here in the clinic. Overall I think that he is just not can be able to be discharged with his legs the way they are he is much to volume overloaded as far as fluid is concerned and that was discussed with him today of also discussed this but should try the clinic nurse manager as well as Dr. Leanord Hawking. 04/26/2020 upon evaluation today patient  appears to be doing better with regard to his wounds currently. He is making some progress and overall swelling is under good control with the compression wraps. Fortunately there is no evidence of active infection at this time. 05/10/2020 on evaluation today patient appears to be doing overall well in regard to his lower extremities bilaterally. He is Tolerating the compression wraps without complication and with what we are seeing currently I feel like that he is making excellent progress. There is no signs of active infection at this time. 05/24/2020 upon evaluation today patient appears to be doing well in regard to his legs. The swelling is actually quite a bit down compared to where it has been in the past. Fortunately there is no sign of active infection at this time which is also good news. With that being said he does have several wounds on his toes that have opened up at this point. 05/31/2020 upon evaluation today patient appears to be doing well  with regard to his legs bilaterally where he really has no significant fluid buildup at this point overall he seems to be doing quite well. Very pleased in this regard. With regard to his toes these also seem to be drying up which is excellent. We have continue to wrap him as every time we tried as a transition to the juxta light wraps things just do not seem to get any better. 06/07/2020 upon evaluation today patient appears to be doing well with regard to his right leg at this point. Unfortunately left leg has a lot of blistering he tells me the wrap started to slide down on him when he tried to put his other Velcro wrap over top of it to help keep things in order but nonetheless still had some issues. 06/14/2020 on evaluation today patient appears to be doing well with regard to his lower extremity ulcers and foot ulcers at this point. I feel like everything is actually showing signs of improvement which is great news overall there is no signs of active  infection at this time. No fevers, chills, nausea, vomiting, or diarrhea. 06/21/2020 on evaluation today patient actually appears to be doing okay in regard to his wounds in general. With that being said the biggest issue I see is on his right foot in particular the first and second toe seem to be doing a little worse due to the fact this is staying very wet. I think he is probably getting need to change out his dressings a couple times in between each week when we see him in regard to his toes in order to keep this drier based on the location and how this is proceeding. 06/28/2020 on evaluation today patient appears to be doing a little bit more poorly overall in regard to the appearance of the skin I am actually somewhat concerned about the possibility of him having a little bit of an infection here. We discussed the course of potentially giving him a doxycycline prescription which he is taken previously with good result. With that being said I do believe that this is potentially mild and at this point easily fixed. I just do not want anything to get any worse. 07/12/2020 upon evaluation today patient actually appears to be making some progress with regard to his legs which is great news there does not appear to be any evidence of active infection. Overall very pleased with where things stand. 07/26/2020 upon evaluation today patient appears to be doing well with regard to his leg ulcers and toe ulcers at this point. He has been tolerating the compression wraps without complication overall very pleased in this regard. 08/09/2020 upon evaluation today patient appears to be doing well with regard to his lower extremities bilaterally. Fortunately there is no signs of active infection overall I am pleased with where things stand. 08/23/2020 on evaluation today patient appears to be doing well with regard to his wound. He has been tolerating the dressing changes without complication. Fortunately there is no  signs of active infection at this time. Overall his legs seem to be doing quite well which is great news and I am very pleased in that regard. No fevers, chills, nausea, vomiting, or diarrhea. 09/13/2020 upon evaluation today patient appears to be doing okay in regard to his lower extremities. He does have a fairly large blister on the right leg which I did remove the blister tissue from today so we can get this to dry out other than that however he  seems to be doing quite well. There is no signs of active infection at this time. 09/27/2020 upon evaluation today patient appears to actually be doing some better in regard to his right leg. Fortunately signs of active infection at this time which is great news. No fevers, chills, nausea, vomiting, or diarrhea. 10/04/2020 upon evaluation today patient actually appears to be showing signs of improvement which is great news with regard to his leg ulcers. Fortunately there is no signs of active infection which is great news he is still taking the antibiotics currently. No fevers, chills, nausea, vomiting, or diarrhea. 10/18/2020 on evaluation today patient appears to be doing well with regard to his legs currently. He has been tolerating the dressing changes including the wraps without complication. Fortunately there is no signs of active infection at this time. No fevers, chills, nausea, vomiting, or diarrhea. 10/25/2020 upon evaluation today patient appears to be doing decently well in regard to his wounds currently. He has been tolerating the dressing changes without complication. Overall I feel like he is making good progress albeit slow. Again this is something we can have to continue to wrap for some time to come most likely. 11/08/2020 upon evaluation today patient appears to be doing well with regard to his wounds currently. He has been tolerating the dressing changes without complication is not currently on any antibiotics and he does not appear to  show any signs of infection. He does continue to have a lot of drainage on the right leg not too severe but nonetheless this is very scattered. On the left leg this is looking to be much improved overall. 11/15/2020 upon evaluation today patient appears to be doing better with regard to his legs bilaterally. Especially the right leg which was much more significant last week. There does not appear to be any signs of active infection which is great news. No fevers, chills, nausea, vomiting, or diarrhea. 11/23/2019 upon evaluation today patient appears to be doing poorly still in regard to his lower extremities bilaterally. Unfortunately his right leg in particular appears to be doing much more poorly there is no signs really of infection this is not warm to touch but he does have a lot of drainage and weeping unfortunately. With that reason I do believe that we may need to initiate some treatment here to try to help calm down some of the swelling of the right leg. I think switching to a 4-layer compression wrap would be beneficial here. The patient is in agreement with giving this a try. 11/29/2020 upon evaluation today patient appears to be doing well currently in regard to his leg ulcers. I feel like the right leg is doing better he still has a lot of drainage but we do see some improvement here. The 4-layer compression wrap I think was helpful. 12/06/2020 upon evaluation today patient appears to be doing well with regard to his legs. In fact they seem to be doing about the best I have seen up to this point. Fortunately there is no signs of active infection at this time. No fevers, chills, nausea, vomiting, or diarrhea. 12/20/2020 upon evaluation today patient appears to be doing well at this time in regard to his legs. He is not having any significant draining which is great news. Fortunately there is no signs of active infection at this time. No fevers, chills, nausea, vomiting, or diarrhea. 01/17/2021 upon  evaluation today Elizabeth actually appears to be doing excellent in regard to his legs. He has a  few areas again that come and go as far as his toes are concerned but overall this is doing quite well. 01/31/2021 upon evaluation today patient appears to be doing well with regard to his legs. Fortunately there does not appear to be any signs of active infection which is great news. Overall he is still having significant edema despite the compression wraps basically the 4-layer compression wrap to just keep things under control there is really not much room for play. 4/13: Mr. Allman is a longstanding patient in our clinic and benefits greatly from weekly compression wraps. Today he has no complaints. He has been tolerating the wraps well. He states he is using the lymphedema pumps at home. 5/4; patient presents for follow-up of his chronic lymphedema/venous insufficiency ulcers. He comes weekly for compression wraps. He has no complaints today. He was unable to tolerate the Coflex 2 layer Last week so we will do the four press 4-layer compression. He has been using his lymphedema pumps daily. 5/18; patient presents for 2-week follow-up. He has no complaints or issues today. He has developed a new wound to the right foot on his fourth toe. He overall feels well and denies signs of infection. 6/1; patient presents for 2-week follow-up. He has no complaints or issues today. He denies signs of infection. 04/18/2021 upon evaluation today patient appears to be doing well with regard to his legs bilaterally. Family open wound is actually on the toe of his left foot everything else is completely closed which is great news. In general I am extremely pleased with where things stand at this point. The patient is also happy that things are doing so well. 05/02/2021 upon evaluation today patient's legs actually appear to be doing quite well today. Fortunately there does not appear to be any signs of active infection  which is great and overall I am extremely pleased with where he stands today. The patient does not appear to have any evidence of active infection at this time which is also great news. 05/09/2021 upon evaluation today patient appears to be doing a little bit more poorly in regard to his legs. Unfortunately he is having issues with some breakdown and a blood blister on the left leg this is due to I believe honestly to how it was wrapped last week. Fortunately there does not appear to be any signs of infection but nonetheless this is still a concern to be honest. No fevers, chills, nausea, vomiting, or diarrhea. Electronic Signature(s) Signed: 05/09/2021 11:39:33 AM By: Lenda Kelp PA-C Entered By: Lenda Kelp on 05/09/2021 11:39:33 -------------------------------------------------------------------------------- Physical Exam Details Patient Name: Date of Service: [name], Susana J. 05/09/2021 10:30 A M Medical Record Number: [id number] Patient Account Number: [id number] Date of Birth/Sex: Treating RN: May 28, 1951 (70 y.o. [name] Primary Care Provider: Nicoletta Ba Other Clinician: Referring Provider: Treating Provider/Extender: Adele Dan in Treatment: 276 Constitutional Obese and well-hydrated in no acute distress. Respiratory normal breathing without difficulty. Psychiatric this patient is able to make decisions and demonstrates good insight into disease process. Alert and Oriented x 3. pleasant and cooperative. Notes Patient's wound bed showed signs of good granulation and epithelization underneath the blood blister this does not appear to be too deep this is on the left. In general I think that the patient's legs do not look quite as good as they did last time I saw him but again were not really seeing any signs either of infection right now we will  continue to keep a close eye on this but I think with appropriate wrapping and using alginate we  should be able to turn this around. Electronic Signature(s) Signed: 05/09/2021 11:39:59 AM By: Lenda Kelp PA-C Entered By: Lenda Kelp on 05/09/2021 11:39:59 -------------------------------------------------------------------------------- Physician Orders Details Patient Name: Date of Service: [name], Gil J. 05/09/2021 10:30 A M Medical Record Number: [id number] Patient Account Number: [id number] Date of Birth/Sex: Treating RN: 27-Nov-1950 (70 y.o. [name] Primary Care Provider: Nicoletta Ba Other Clinician: Referring Provider: Treating Provider/Extender: Adele Dan in Treatment432 [phone] Verbal / Phone Orders: No Diagnosis Coding Follow-up Appointments ppointment in 1 week. - with Leonard Schwartz Return A Bathing/ Shower/ Hygiene May shower with protection but do not get wound dressing(s) wet. Edema Control - Lymphedema / SCD / Other Bilateral Lower Extremities Lymphedema Pumps. Use Lymphedema pumps on leg(s) 2-3 times a day for 45-60 minutes. If wearing any wraps or hose, do not remove them. Continue exercising as instructed. Elevate legs to the level of the heart or above for 30 minutes daily and/or when sitting, a frequency of: - throughout the day Avoid standing for long periods of time. Exercise regularly Non Wound Condition Bilateral Lower Extremities Other Non Wound Condition Orders/Instructions: - lotion to both legs, 4 layer compression wraps both legs, silver alginate to any weeping areas Wound Treatment Wound #193 - T Second oe Wound Laterality: Left Prim Dressing: KerraCel Ag Gelling Fiber Dressing, 2x2 in (silver alginate) 1 x Per Week/15 Days ary Discharge Instructions: Apply silver alginate to wound bed as instructed Secondary Dressing: Woven Gauze Sponges 2x2 in 1 x Per Week/15 Days Discharge Instructions: Apply over primary dressing as directed. Secured With: Insurance underwriter, Sterile 2x75 (in/in) 1 x Per Week/15  Days Discharge Instructions: Secure with stretch gauze as directed. Wound #197 - Lower Leg Wound Laterality: Left, Anterior Peri-Wound Care: Sween Lotion (Moisturizing lotion) 1 x Per Week/30 Days Discharge Instructions: Apply moisturizing lotion as directed Prim Dressing: KerraCel Ag Gelling Fiber Dressing, 4x5 in (silver alginate) 1 x Per Week/30 Days ary Discharge Instructions: Apply silver alginate to wound bed as instructed Secondary Dressing: Woven Gauze Sponge, Non-Sterile 4x4 in 1 x Per Week/30 Days Discharge Instructions: Apply over primary dressing as directed. Secondary Dressing: ABD Pad, 5x9 1 x Per Week/30 Days Discharge Instructions: Apply over primary dressing as directed. Compression Wrap: FourPress (4 layer compression wrap) 1 x Per Week/30 Days Discharge Instructions: Apply four layer compression as directed. May also use [place] CoFlex 2 layer compression system as alternative. Electronic Signature(s) Signed: 05/09/2021 4:43:01 PM By: Lenda Kelp PA-C Signed: 05/09/2021 6:17:15 PM By: Zenaida Deed RN, BSN Signed: 05/09/2021 6:17:15 PM By: Zenaida Deed RN, BSN Entered By: Zenaida Deed on 05/09/2021 11:39:37 -------------------------------------------------------------------------------- Problem List Details Patient Name: Date of Service: [facility], Henson J. 05/09/2021 10:30 A M Medical Record Number: [id number] Patient Account Number: [id number] Date of Birth/Sex: Treating RN: 1951/08/16 (70 y.o. 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 04/27/2018 No Yes skin L97.829 Non-pressure chronic ulcer of other part of left lower leg with unspecified 03/21/2021 No  Yes severity L97.519 Non-pressure chronic ulcer of other part of right foot with unspecified severity 03/21/2021 No Yes I87.333 Chronic venous hypertension (idiopathic) with ulcer and  inflammation of 01/22/2016 No Yes bilateral lower extremity I89.0 Lymphedema, not elsewhere classified 01/22/2016 No Yes E11.622 Type 2 diabetes mellitus with other skin ulcer 01/22/2016 No Yes E11.40 Type 2 diabetes mellitus with diabetic neuropathy, unspecified 01/22/2016 No Yes Inactive Problems ICD-10 Code Description Active Date Inactive Date L03.115 Cellulitis of right lower limb 12/22/2017 12/22/2017 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 06/30/2018 06/30/2018 L97.511 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 of skin 06/30/2018 06/30/2018 Resolved Problems ICD-10 Code Description Active Date Resolved Date L97.211 Non-pressure chronic ulcer of right calf limited to breakdown of skin 06/30/2018 06/30/2018 L97.221 Non-pressure chronic ulcer of left calf limited to breakdown of skin 09/30/2016 09/30/2016 L03.116 Cellulitis of left lower limb 04/01/2017 04/01/2017 L97.211 Non-pressure chronic ulcer of right calf limited to breakdown of skin 06/30/2017 06/30/2017 Electronic Signature(s) Signed: 05/09/2021 11:38:48 AM By: Lenda KelpStone III, Lilac Hoff PA-C Entered By: Lenda KelpStone III, Kenli Waldo on 05/09/2021 11:38:47 -------------------------------------------------------------------------------- Progress Note Details Patient Name: Date of Service: [name], Zakkary J. 05/09/2021 10:30 A M Medical Record Number: [id number] Patient Account Number: [id number] Date of Birth/Sex: Treating RN: 10/21/1951 (70 y.o. Damaris SchoonerM) Boehlein, Linda Primary Care Provider: Nicoletta BaMcGowen, [name] Other Clinician: Referring Provider: Treating Provider/Extender: Adele DanStone III, Demyah Smyre McGowen, [name] in Treatment: 276 Subjective Chief Complaint Information obtained from Patient patient is here for evaluation of  venous/lymphedema ulcers History of Present Illness (HPI) Referred by PCP for consultation. Patient has long standing history of BLE venous stasis, no prior ulcerations. At beginning of month, developed cellulitis and weeping. Received IM Rocephin followed by Keflex and resolved. Wears compression stocking, appr 6 months old. Not sure strength. No present drainage. 01/22/16 this is a patient who is a type II diabetic on insulin. 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 He tells me he religiously wears pressure stockings of uncertain strength. He was here with weeping edema about 8 months ago but did not have an open wound. Roughly a month ago he had a reopening on his bilateral legs. He is been using bandages and Neosporin. He does not complain of pain. 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 Last BUN/creatinine I have is from 11/20/15 at 13 and 1.0 respectively 01/29/16; patient arrives today having tolerated the Profore wrap. He brought in his stockings and these are 18 mmHg stockings [facility] from [employer]. The compression here is likely inadequate. He does not complain of pain or excessive drainage she has no systemic symptoms. The wound on the right looks improved as does the one on the left although one on the left is more substantial with still tissue at risk below the actual wound area on the bilateral posterior calf 02/05/16; patient arrives with poor edema control. He states that we did put a 4 layer compression on it last week. No weight appear 5 this. 02/12/16; the area on the posterior right Has healed. The left Has a substantial wound that has necrotic surface eschar that requires a debridement with a curette. 02/16/16;the patient called or a Nurse visit secondary to increased swelling. He had been in earlier in the week with his right leg healed.  He was transitioned to is on pressure stocking on the right leg with the only open wound on the left, a substantial area on the left posterior calf. Note he has a history of severe lower extremity edema,  he has a history of chronic atrial fibrillation but not heart failure per my notes but I'll need to research this. He is not complaining of chest pain shortness of breath or orthopnea. The intake nurse noted blisters on the previously closed right leg 02/19/16; this is the patient's regular visit day. I see him on Friday with escalating edema new wounds on the right leg and clear signs of at least right ventricular heart failure. I increased his Lasix to 40 twice a day. He is returning currently in follow-up. States he is noticed a decrease in that the edema 02/26/16 patient's legs have much less edema. There is nothing really open on the right leg. The left leg has improved condition of the large superficial wound on the posterior left leg 03/04/16; edema control is very much better. The patient's right leg wounds have healed. On the left leg he continues to have severe venous inflammation on the posterior aspect of the left leg. There is no tenderness and I don't think any of this is cellulitis. 03/11/16; patient's right leg is married healed and he is in his own stocking. The patient's left leg has deteriorated somewhat. There is a lot of erythema around the wound on the posterior left leg. There is also a significant rim of erythema posteriorly just above where the wrap would've ended there is a new wound in this location and a lot of tenderness. Can't rule out cellulitis in this area. 03/15/16; patient's right leg remains healed and he is in his own stocking. The patient's left leg is much better than last review. His major wound on the posterior aspect of his left Is almost fully epithelialized. He has 3 small injuries from the wraps. Really. Erythema seems a lot better on antibiotics 03/18/16; right  leg remains healed and he is in his own stocking. The patient's left leg is much better. The area on the posterior aspect of the left calf is fully epithelialized. His 3 small injuries which were wrap injuries on the left are improved only one seems still open his erythema has resolved 03/25/16; patient's right leg remains healed and he is in his own stocking. There is no open area today on the left leg posterior leg is completely closed up. His wrap injuries at the superior aspect of his leg are also resolved. He looks as though he has some irritation on the dorsal ankle but this is fully epithelialized without evidence of infection. 03/28/16; we discharged this patient on Monday. Transitioned him into his own stocking. There were problems almost immediately with uncontrolled swelling weeping edema multiple some of which have opened. He does not feel systemically unwell in particular no chest pain no shortness of breath and he does not feel 04/08/16; the edema is under better control with the Profore light wrap but he still has pitting edema. There is one large wound anteriorly 2 on the medial aspect of his left leg and 3 small areas on the superior posterior calf. Drainage is not excessive he is tolerating a Profore light well 04/15/16; put a Profore wrap on him last week. This is controlled is edema however he had a lot of pain on his left anterior foot most of his wounds are healed 04/22/16 once again the patient has denuded areas on the left anterior foot which he states are because his wrap slips up word. He saw his primary physician today is on Lasix 40 twice a day and states that he his weight is down 20  pounds over the last 3 months. 04/29/16: Much improved. left anterior foot much improved. He is now on Lasix 80 mg per day. Much improved edema control 05/06/16; I was hoping to be able to discharge him today however once again he has blisters at a low level of where the compression was placed last  week mostly on his left lateral but also his left medial leg and a small area on the anterior part of the left foot. 05/09/16; apparently the patient went home after his appointment on 7/4 later in the evening developing pain in his upper medial thigh together with subjective fever and chills although his temperature was not taken. The pain was so intense he felt he would probably have to call 911. However he then remembered that he had leftover doxycycline from a previous round of antibiotics and took these. By the next morning he felt a lot better. He called and spoke to one of our nurses and I approved doxycycline over the phone thinking that this was in relation to the wounds we had previously seen although they were definitely were not. The patient feels a lot better old fever no chills he is still working. Blood sugars are reasonably controlled 05/13/16; patient is back in for review of his cellulitis on his anterior medial upper thigh. He is taking doxycycline this is a lot better. Culture I did of the nodular area on the dorsal aspect of his foot grew MRSA this also looks a lot better. 05/20/16; the patient is cellulitis on the medial upper thigh has resolved. All of his wound areas including the left anterior foot, areas on the medial aspect of the left calf and the lateral aspect of the calf at all resolved. He has a new blister on the left dorsal foot at the level of the fourth toe this was excised. No evidence of infection 05/27/16; patient continues to complain weeping edema. He has new blisterlike wounds on the left anterior lateral and posterior lateral calf at the top of his wrap levels. The area on his left anterior foot appears better. He is not complaining of fever, pain or pruritus in his feet. 05/30/16; the patient's blisters on his left anterior leg posterior calf all look improved. He did not increase the Lasix 100 mg as I suggested because he was going to run out of his 40 mg  tablets. He is still having weeping edema of his toes 06/03/16; I renewed his Lasix at 80 mg once a day as he was about to run out when I last saw him. He is on 80 mg of Lasix now. I have asked him to cut down on the excessive amount of water he was drinking and asked him to drink according to his thirst mechanisms 06/12/2016 -- was seen 2 days ago and was supposed to wear his compression stockings at home but he is developed lymphedema and superficial blisters on the left lower extremity and hence came in for a review 06/24/16; the remaining wound is on his left anterior leg. He still has edema coming from between his toes. There is lymphedema here however his edema is generally better than when I last saw this. He has a history of atrial fibrillation but does not have a known history of congestive heart failure nevertheless I think he probably has this at least on a diastolic basis. 07/01/16 I reviewed his echocardiogram from January 2017. This was essentially normal. He did not have LVH, EF of 55-60%. His right ventricular  function was normal although he did have trivial tricuspid and pulmonic regurgitation. This is not audible on exam however. I increased his Lasix to do massive edema in his legs well above his knees I think in early July. He was also drinking an excessive amount of water at the time. 07/15/16; missed his appointment last week because of the Labor Day holiday on Monday. He could not get another appointment later in the week. Started to feel the wrap digging in superiorly so we remove the top half and the bottom half of his wrap. He has extensive erythema and blistering superiorly in the left leg. Very tender. Very swollen. Edema in his foot with leaking edema fluid. He has not been systemically unwell 07/22/16; the area on the left leg laterally required some debridement. The medial wounds look more stable. His wrap injury wounds appear to have healed. Edema and his foot is better,  weeping edema is also better. He tells me he is meeting with the supplier of the external compression pumps at work 08/05/16; the patient was on vacation last week in [facility]. His wrap is been on for an extended period of time. Also over the weekend he developed an extensive area of tender erythema across his anterior medial thigh. He took to doxycycline yesterday that he had leftover from a previous prescription. The patient complains of weeping edema coming out of his toes 08/08/16; I saw this patient on 10/2. He was tender across his anterior thigh. I put him on doxycycline. He returns today in follow-up. He does not have any open wounds on his lower leg, he still has edema weeping into his toes. 08/12/16; patient was seen back urgently today to follow-up for his extensive left thigh cellulitis/erysipelas. He comes back with a lot less swelling and erythema pain is much better. I believe I gave him Augmentin and Cipro. His wrap was cut down as he stated a roll down his legs. He developed blistering above the level of the wrap that remained. He has 2 open blisters and 1 intact. 08/19/16; patient is been doing his primary doctor who is increased his Lasix from 40-80 once a day or 80 already has less edema. Cellulitis has remained improved in the left thigh. 2 open areas on the posterior left calf 08/26/16; he returns today having new open blisters on the anterior part of his left leg. He has his compression pumps but is not yet been shown how to use some vital representative from the supplier. 09/02/16 patient returns today with no open wounds on the left leg. Some maceration in his plantar toes 09/10/2016 -- Dr. Leanord Hawking had recently discharged him on 09/02/2016 and he has come right back with redness swelling and some open ulcers on his left lower extremity. He says this was caused by trying to apply his compression stockings and he's been unable to use this and has not been able to use  his lymphedema pumps. He had some doxycycline leftover and he has started on this a few days ago. 09/16/16; there are no open wounds on his leg on the left and no evidence of cellulitis. 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 He does not complain of symptoms here. I am not clear use using his external compression pumps. 09/23/16; I have not seen this patient in 2 weeks. He canceled his appointment 10 days ago as he was going on vacation. He tells me that on Monday he  noticed a large area on his posterior left leg which is been draining copiously and is reopened into a large wound. He is been using ABDs and the external part of his juxtalite, according to our nurse this was not on properly. 10/07/16; Still a substantial area on the posterior left leg. Using silver alginate 10/14/16; in general better although there is still open area which looks healthy. Still using silver alginate. He reminds me that this happen before he left for [facility]. T oday while he was showering in the morning. He had been using his juxtalite's 10/21/16; the area on his posterior left leg is fully epithelialized. However he arrives today with a large area of tender erythema in his medial and posterior left thigh just above the knee. I have marked the area. Once again he is reluctant to consider hospitalization. I treated him with oral antibiotics in the past for a similar situation with resolution I think with doxycycline however this area it seems more extensive to me. He is not complaining of fever but does have chills and says states he is thirsty. His blood sugar today was in the 140s at home 10/25/16 the area on his posterior left leg is fully epithelialized although there is still some weeping edema. The large area of tenderness and erythema in his medial and posterior left thigh is a lot less tender although there is still a lot of swelling in this thigh.  He states he feels a lot better. He is on doxycycline and Augmentin that I started last week. This will continued until Tuesday, December 26. I have ordered a duplex ultrasound of the left thigh rule out DVT whether there is an abscess something that would need to be drained I would also like to know. 11/01/16; he still has weeping edema from a not fully epithelialized area on his left posterior calf. Most of the rest of this looks a lot better. He has completed his antibiotics. His thigh is a lot better. Duplex ultrasound did not show a DVT in the thigh 11/08/16; he comes in today with more Denuded surface epithelium from the posterior aspect of his calf. There is no real evidence of cellulitis. The superior aspect of his wrap appears to have put quite an indentation in his leg just below the knee and this may have contributed. He does not complain of pain or fever. We have been using silver alginate as the primary dressing. The area of cellulitis in the right thigh has totally resolved. He has been using his compression stockings once a week 11/15/16; the patient arrives today with more loss of epithelium from the posterior aspect of his left calf. He now has a fairly substantial wound in this area. The reason behind this deterioration isn't exactly clear although his edema is not well controlled. He states he feels he is generally more swollen systemically. He is not complaining of chest pain shortness of breath fever. T me he has an appointment with his primary physician in early February. He is on 80 mg of oral ells Lasix a day. He claims compliance with the external compression pumps. He is not having any pain in his legs similar to what he has with his recurrent cellulitis 11/22/16; the patient arrives a follow-up of his large area on his left lateral calf. This looks somewhat better today. He came in earlier in the week for a dressing change since I saw him a week ago. He is not complaining of any  pain  no shortness of breath no chest pain 11/28/16; the patient arrives for follow-up of his large area on the left lateral calf this does not look better. In fact it is larger weeping edema. The surface of the wound does not look too bad. We have been using silver alginate although I'm not certain that this is a dressing issue. 12/05/16; again the patient follows up for a large wound on the left lateral and left posterior calf this does not look better. There continues to be weeping edema necrotic surface tissue. More worrisome than this once again there is erythema below the wound involving the distal Achilles and heel suggestive of cellulitis. He is on his feet working most of the day of this is not going well. We are changing his dressing twice a week to facilitate the drainage. 12/12/16; not much change in the overall dimensions of the large area on the left posterior calf. This is very inflamed looking. I gave him an. Doxycycline last week does not really seem to have helped. He found the wrap very painful indeed it seems to of dog into his legs superiorly and perhaps around the heel. He came in early today because the drainage had soaked through his dressings. 12/19/16- patient arrives for follow-up evaluation of his left lower extremity ulcers. He states that he is using his lymphedema pumps once daily when there is "no drainage". He admits to not using his lipedema pumps while under current treatment. His blood sugars have been consistently between 150-200. 12/26/16; the patient is not using his compression pumps at home because of the wetness on his feet. I've advised him that I think it's important for him to use this daily. He finds his feet too wet, he can put a plastic bag over his legs while he is in the pumps. Otherwise I think will be in a vicious circle. We are using silver alginate to the major area on his left posterior calf 01/02/17; the patient's posterior left leg has further of all into  3 open wounds. All of them covered with a necrotic surface. He claims to be using his compression pumps once a day. His edema control is marginal. Continue with silver alginate 01/10/17; the patient's left posterior leg actually looks somewhat better. There is less edema, less erythema. Still has 3 open areas covered with a necrotic surface requiring debridement. He claims to be using his compression pumps once a day his edema control is better 01/17/17; the patient's left posterior calf look better last week when I saw him and his wrap was changed 2 days ago. He has noted increasing pain in the left heel and arrives today with much larger wounds extensive erythema extending down into the entire heel area especially tender medially. He is not systemically unwell CBGs have been controlled no fever. Our intake nurse showed me limegreen drainage on his AVD pads. 01/24/17; his usual this patient responds nicely to antibiotics last week giving him Levaquin for presumed Pseudomonas. The whole entire posterior part of his leg is much better much less inflamed and in the case of his Achilles heel area much less tender. He has also had some epithelialization posteriorly there are still open areas here and still draining but overall considerably better 01/31/17- He has continue to tolerate the compression wraps. he states that he continues to use the lymphedema pumps daily, and can increase to twice daily on the weekends. He is voicing no complaints or concerns regarding his LLE ulcers 02/07/17-he is here  for follow-up evaluation. He states that he noted some erythema to the left medial and anterior thigh, which he states is new as of yesterday. He is concerned about recurrent cellulitis. He states his blood sugars have been slightly elevated, this morning in the 180s 02/14/17; he is here for follow-up evaluation. When he was last here there was erythema superiorly from his posterior wound in his anterior thigh. He  was prescribed Levaquin however a culture of the wound surface grew MRSA over the phone I changed him to doxycycline on Monday and things seem to be a lot better. 02/24/17; patient missed his appointment on Friday therefore we changed his nurse visit into a physician visit today. Still using silver alginate on the large area of the posterior left thigh. He isn't new area on the dorsal left second toe 03/03/17; actually better today although he admits he has not used his external compression pumps in the last 2 days or so because of work responsibilities over the weekend. 03/10/17; continued improvement. External compression pumps once a day almost all of his wounds have closed on the posterior left calf. Better edema control 03/17/17; in general improved. He still has 3 small open areas on the lateral aspect of his left leg however most of the area on the posterior part of his leg is epithelialized. He has better edema control. He has an ABD pad under his stocking on the right anterior lower leg although he did not let us look at that today. 03/24/17; patient arrives back in clinic today with no open areas however there are areas on the posterior left calf and anterior left calf that are less than 100% epithelialized. His edema is well controlled in the left lower leg. There is some pitting edema probably lymphedema in the left upper thigh. He uses compression pumps at home once per day. I tried to get him to do this twice a day although he is very reticent. 04/01/2017 -- for the last 2 days he's had significant redness, tenderness and weeping and came in for an urgent visit today. 04/07/17; patient still has 6 more days of doxycycline. He was seen by Dr. Meyer Russel last Wednesday for cellulitis involving the posterior aspect, lateral aspect of his Involving his heel. For the most part he is better there is less erythema and less weeping. He has been on his feet for 12 hours o2 over the weekend. Using his  compression pumps once a day 04/14/17 arrives today with continued improvement. Only one area on the posterior left calf that is not fully epithelialized. He has intense bilateral venous inflammation associated with his chronic venous insufficiency disease and secondary lymphedema. We have been using silver alginate to the left posterior calf wound In passing he tells [place] today that the right leg but we have not seen in quite some time has an open area on it but he doesn't want [place] to look at this today states he will show this to [place] next week. 04/21/17; there is no open area on his left leg although he still reports some weeping edema. He showed [place] his right leg today which is the first time we've seen this leg in a long time. He has a large area of open wound on the right leg anteriorly healthy granulation. Quite a bit of swelling in the right leg and some degree of venous inflammation. He told us about the right leg in passing last week but states that deterioration in the right leg really only  happened over the weekend 04/28/17; there is no open area on the left leg although there is an irritated part on the posterior which is like a wrap injury. The wound on the right leg which was new from last week at least to [place] is a lot better. 05/05/17; still no open area on the left leg. Patient is using his new compression stocking which seems to be doing a good job of controlling the edema. He states he is using his compression pumps once per day. The right leg still has an open wound although it is better in terms of surface area. Required debridement. A lot of pain in the posterior right Achilles marked tenderness. Usually this type of presentation this patient gives concern for an active cellulitis 05/12/17; patient arrives today with his major wound from last week on the right lateral leg somewhat better. Still requiring debridement. He was using his compression stocking on the left leg however that is  reopened with superficial wounds anteriorly he did not have an open wound on this leg previously. He is still using his juxta light's once daily at night. He cannot find the time to do this in the morning as he has to be at work by 7 AM 05/19/17; right lateral leg wound looks improved. No debridement required. The concerning area is on the left posterior leg which appears to almost have a subcutaneous hemorrhagic component to it. We've been using silver alginate to all the wounds 05/26/17; the right lateral leg wound continues to look improved. However the area on the left posterior calf is a tightly adherent surface. [place] using silver alginate. Because of the weeping edema in his legs there is very little good alternatives. 06/02/17; the patient left here last week looking quite good. Major wound on the left posterior calf and a small one on the right lateral calf. Both of these look satisfactory. He tells me that by Wednesday he had noted increased pain in the left leg and drainage. He called on Thursday and Friday to get an appointment here but we were blocked. He did not go to urgent care or his primary physician. He thinks he had a fever on Thursday but did not actually take his temperature. He has not been using his compression pumps on the left leg because of pain. I advised him to go to the emergency room today for IV antibiotics for stents of left leg cellulitis but he has refused I have asked him to take 2 days off work to keep his leg elevated and he has refused this as well. In view of this I'm going to call him and Augmentin and doxycycline. 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 However after a detailed discussion regarding the risk benefits and  alternatives he refuses to get admitted to the hospital. With no other recourse I will continue him on oral antibiotics as before and hopefully he'll have his infectious disease consultation this week. 06/16/2017 -- the patient was seen today by the nurse practitioner at infectious disease Ms. Dixon. Her review noted recurrent cellulitis of the lower extremity with tinea pedis of the left foot and she has recommended clindamycin 150 mg daily for now and she may increase it to 300 mg daily  to cover staph and Streptococcus. He has also been advise Lotrimin cream locally. she also had wise IV antibiotics for his condition if it flares up 06/23/17; patient arrives today with drainage bilaterally although the remaining wound on the left posterior calf after cleaning up today "highlighter yellow drainage" did not look too bad. Unfortunately he has had breakdown on the right anterior leg [previously this leg had not been open and he is using a black stocking] he went to see infectious disease and is been put on clindamycin 150 mg daily, I did not verify the dose although I'm not familiar with using clindamycin in this dosing range, perhaps for prophylaxisoo 06/27/17; I brought this patient back today to follow-up on the wound deterioration on the right lower leg together with surrounding cellulitis. I started him on doxycycline 4 days ago. This area looks better however he comes in today with intense cellulitis on the medial part of his left thigh. This is not have a wound in this area. Extremely tender. We've been using silver alginate to the wounds on the right lower leg left lower leg with bilateral 4 layer compression he is using his external compression pumps once a day 07/04/17; patient's left medial thigh cellulitis looks better. He has not been using his compression pumps as his insert said it was contraindicated with cellulitis. His right leg continues to make improvements all the wounds are still open.  We only have one remaining wound on the left posterior calf. Using silver alginate to all open areas. He is on doxycycline which I started a week ago and should be finishing I gave him Augmentin after Thursday's visit for the severe cellulitis on the left medial thigh which fortunately looks better 07/14/17; the patient's left medial thigh cellulitis has resolved. The cellulitis in his right lower calf on the right also looks better. All of his wounds are stable to improved we've been using silver alginate he has completed the antibiotics I have given him. He has clindamycin 150 mg once a day prescribed by infectious disease for prophylaxis, I've advised him to start this now. We have been using bilateral Unna boots over silver alginate to the wound areas 07/21/17; the patient is been to see infectious disease who noted his recurrent problems with cellulitis. He was not able to tolerate prophylactic clindamycin therefore he is on amoxicillin 500 twice a day. He also had a second daily dose of Lasix added By Dr. Oneta Rack but he is not taking this. Nor is he being completely compliant with his compression pumps a especially not this week. He has 2 remaining wounds one on the right posterior lateral lower leg and one on the left posterior medial lower leg. 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 The patient has Unna boots bilaterally. 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 He has bilateral Unna boots for compression and silver alginate to his wounds. Arrives today with his legs looking as good as I have seen him in quite some time. Not surprisingly his wounds look better as well with improvement on the right lateral leg venous insufficiency wound and also the left medial leg. He  is still using the compression pumps once a day 08/11/17; both legs appear to be doing better wounds on the right lateral and  left medial legs look better. Skin on the right leg quite good. He is been using silver alginate as the primary dressing. I'm going to use Anasept gel calcium alginate and maintain all the secondary dressings 08/18/17; the patient continues to actually do quite well. The area on his right lateral leg is just about closed the left medial also looks better although it is still moist in this area. His edema is well controlled we have been using Anasept gel with calcium alginate and the usual secondary dressings, 4 layer compression and once daily use of his compression pumps "always been able to manage 09/01/17; the patient continues to do reasonably well in spite of his trip to [place]. The area on the right lateral leg is epithelialized. Left is much better but still open. He has more edema and more chronic erythema on the left leg [venous inflammation] 09/08/17; he arrives today with no open wound on the right lateral leg and decently controlled edema. Unfortunately his left leg is not nearly as in his good situation as last week.he apparently had increasing edema starting on Saturday. He edema soaked through into his foot so used a plastic bag to walk around his home. The area on the medial right leg which was his open area is about the same however he has lost surface epithelium on the left lateral which is new and he has significant pain in the Achilles area of the left foot. He is already on amoxicillin chronically for prophylaxis of cellulitis in the left leg 09/15/17; he is completed a week of doxycycline and the cellulitis in the left posterior leg and Achilles area is as usual improved. He still has a lot of edema and fluid soaking through his dressings. There is no open wound on the right leg. He saw infectious disease NP today 09/22/17;As usual 1 we transition him from  our compression wraps to his stockings things did not go well. He has several small open areas on the right leg. He states this was caused by the compression wrap on his skin although he did not wear this with the stockings over them. He has several superficial areas on the left leg medially laterally posteriorly. He does not have any evidence of active cellulitis especially involving the left Achilles The patient is traveling from [facility] Saturday going to [facility]. He states he isn't attempting to get an appointment with a heel objects wound center there to change his dressings. I am not completely certain whether this will work 10/06/17; the patient came in on Friday for a nurse visit and the nurse reported that his legs actually look quite good. He arrives in clinic today for his regular follow-up visit. He has a new wound on his left third toe over the PIP probably caused by friction with his footwear. He has small areas on the left leg and a very superficial but epithelialized area on the right anterior lateral lower leg. Other than that his legs look as good as I've seen him in quite some time. We have been using silver alginate Review of systems; no chest pain no shortness of breath other than this a 10 point review of systems negative 10/20/17; seen by Dr. Meyer Russel last week. He had taken some antibiotics [doxycycline] that he had left over. Dr. Meyer Russel thought he had candida infection and declined to give him further antibiotics. He has a small wound remaining on the right lateral leg several areas on the left leg including a larger area  on the left posterior several left medial and anterior and a small wound on the left lateral. The area on the left dorsal third toe looks a lot better. ROS; Gen.; no fever, respiratory no cough no sputum Cardiac no chest pain other than this 10 point review of system is negative 10/30/17; patient arrives today having fallen in the bathtub 3 days ago. It  took him a while to get up. He has pain and maceration in the wounds on his left leg which have deteriorated. He has not been using his pumps he also has some maceration on the right lateral leg. 11/03/17; patient continues to have weeping edema especially in the left leg. This saturates his dressings which were just put on on 12/27. As usual the doxycycline seems to take care of the cellulitis on his lower leg. He is not complaining of fever, chills, or other systemic symptoms. He states his leg feels a lot better on the doxycycline I gave him empirically. He also apparently gets injections at his primary doctor's officeo Rocephin for cellulitis prophylaxis. I didn't ask him about his compression pump compliance today I think that's probably marginal. Arrives in the clinic with all of his dressings primary and secondary macerated full of fluid and he has bilateral edema 11/10/17; the patient's right leg looks some better although there is still a cluster of wounds on the right lateral. The left leg is inflamed with almost circumferential skin loss medially to laterally although we are still maintaining anteriorly. He does not have overt cellulitis there is a lot of drainage. He is not using compression pumps. We have been using silver alginate to the wound areas, there are not a lot of options here 11/17/17; the patient's right leg continues to be stable although there is still open wounds, better than last week. The inflammation in the left leg is better. Still loss of surface layer epithelium especially posteriorly. There is no overt cellulitis in the amount of edema and his left leg is really quite good, tells me he is using his compression pumps once a day. 11/24/17; patient's right leg has a small superficial wound laterally this continues to improve. The inflammation in the left leg is still improving however we have continuous surface layer epithelial loss posteriorly. There is no overt cellulitis  in the amount of edema in both legs is really quite good. He states he is using his compression pumps on the left leg once a day for 5 out of 7 days 12/01/17; very small superficial areas on the right lateral leg continue to improve. Edema control in both legs is better today. He has continued loss of surface epithelialization and left posterior calf although I think this is better. We have been using silver alginate with large number of absorptive secondary dressings 4 layer on the left Unna boot on the right at his request. He tells me he is using his compression pumps once a day 12/08/17; he has no open area on the right leg is edema control is good here. ooOn the left leg however he has marked erythema and tenderness breakdown of skin. He has what appears to be a wrap injury just distal to the popliteal fossa. 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 There was swelling erythema and breakdown in the right leg. He has much worse skin breakdown in the left leg as well multiple open areas medially and posteriorly as well as laterally. He tells me he has been using his compression pumps but tells me he feels that the drainage out of his leg is worse when he uses a compression pumps. T be fair to him he is been saying this o for a while however I don't know that I have really been listening to this. I wonder if the compression pumps are working properly 12/22/17;. Once again he arrives with severe erythema, weeping edema from the left greater than right leg. Noncompliance with compression pumps. New this visit he is complaining of pain on the lateral aspect of the right leg and the medial aspect of his right thigh. He apparently  saw his cardiologist Dr. [name] who was ordered an echocardiogram area and I think this is a step in the right direction 12/25/17; started his doxycycline Monday night. There is still intense erythema of the right leg especially in the anterior thigh although there is less tenderness. The erythema around the wound on the right lateral calf also is less tender. He still complaining of pain in the left heel. His wounds are about the same right lateral left medial left lateral. Superficial but certainly not close to closure. He denies being systemically unwell no fever chills no abdominal pain no diarrhea 12/29/17; back in follow-up of his extensive right calf and right thigh cellulitis. I added amoxicillin to cover possible doxycycline resistant strep. This seems to of done the trick he is in much less pain there is much less erythema and swelling. He has his echocardiogram at 11:00 this morning. X-ray of the left heel was also negative. 01/05/18; the patient arrived with his edema under much better control. Now that he is retired he is able to use his compression pumps daily and sometimes twice a day per the patient. He has a wound on the right leg the lateral wound looks better. Area on the left leg also looks a lot better. He has no evidence of cellulitis in his bilateral thighs I had a quick peak at his echocardiogram. He is in normal ejection fraction and normal left ventricular function. He has moderate pulmonary hypertension moderately reduced right ventricular function. One would have to wonder about chronic sleep apnea although he says he doesn't snore. He'll review the echocardiogram with his cardiologist. 01/12/18; the patient arrives with the edema in both legs under exemplary control. He is using his compression pumps daily and sometimes twice daily. His wound on the right lateral leg is just about closed. He still has some weeping areas on the posterior left calf and lateral left calf although  everything is just about closed here as well. I have spoken with Aldean Baker who is the patient's nurse practitioner and infectious disease. She was concerned that the patient had not understood that the parenteral penicillin injections he was receiving for cellulitis prophylaxis was actually benefiting him. I don't think the patient actually saw that I would tend to agree we were certainly dealing with less infections although he had a serious one last month. 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 He is healed. He'll be placed in juxtalite compression wraps and increase his lymphedema pumps to twice daily. We will follow up again next week to ensure there are no issues with the new regiment.  01/20/18-he is here for evaluation of bilateral lower extremity weeping edema. Yesterday he was placed in compression wrap to the right lower extremity and compression stocking to left lower shrubbery. He states he uses lymphedema pumps last night and again this morning and noted a blister to the left lower extremity. On exam he was noted to have drainage to the right lower extremity. He will be placed in Unna boots bilaterally and follow-up next week 01/26/18; patient was actually discharged a week ago to his own juxta light stockings only to return the next day with bilateral lower extremity weeping edema.he was placed in bilateral Unna boots. He arrives today with pain in the back of his left leg. There is no open area on the right leg however there is a linear/wrap injury on the left leg and weeping edema on the left leg posteriorly. I spoke with infectious disease about 10 days ago. They were disappointed that the patient elected to discontinue prophylactic intramuscular penicillin shots as they felt it was particularly beneficial in reducing the frequency of his cellulitis. I discussed this with the patient today. He does not share this view. He'll definitely need  antibiotics today. Finally he is traveling to [place] and trauma leaving this Saturday and returning a week later and he does not travel with his pumps. He is going by car 01/30/18; patient was seen 4 days ago and brought back in today for review of cellulitis in the left leg posteriorly. I put him on amoxicillin this really hasn't helped as much as I might like. He is also worried because he is traveling to [name] trauma by car. Finally we will be rewrapping him. There is no open area on the right leg over his left leg has multiple weeping areas as usual 02/09/18; The same wrap on for 10 days. He did not pick up the last doxycycline I prescribed for him. He apparently took 4 days worth he already had. There is nothing open on his right leg and the edema control is really quite good. He's had damage in the left leg medially and laterally especially probably related to the prolonged use of Unna boots 02/12/18; the patient arrived in clinic today for a nurse visit/wrap change. He complained of a lot of pain in the left posterior calf. He is taking doxycycline that I previously prescribed for him. Unfortunately even though he used his stockings and apparently used to compression pumps twice a day he has weeping edema coming out of the lateral part of his right leg. This is coming from the lower anterior lateral skin area. 02/16/18; the patient has finished his doxycycline and will finish the amoxicillin 2 days. The area of cellulitis in the left calf posteriorly has resolved. He is no longer having any pain. He tells me he is using his compression pumps at least once a day sometimes twice. 02/23/18; the patient finished his doxycycline and Amoxil last week. On Friday he noticed a small erythematous circle about the size of a quarter on the left lower leg just above his ankle. This rapidly expanded and he now has erythema on the lateral and posterior part of the thigh. This is bright red. Also has an  area on the dorsal foot just above his toes and a tender area just below the left popliteal fossa. He came off his prophylactic penicillin injections at his own insistence one or 2 months ago. This is obviously deteriorated since then 03/02/18; patient is on doxycycline and Amoxil. Culture I  did last week of the weeping area on the back of his left calf grew group B strep. I have therefore renewed the amoxicillin 500 3 times a day for a further week. He has not been systemically unwell. Still complaining of an area of discomfort right under his left popliteal fossa. There is no open wound on the right leg. He tells me that he is using his pumps twice a day on most days 03/09/18; patient arrives in clinic today completing his amoxicillin today. The cellulitis on his left leg is better. Furthermore he tells me that he had intramuscular penicillin shots that his primary care office today. However he also states that the wrap on his right leg fell down shortly after leaving clinic last week. He developed a large blister that was present when he came in for a nurse visit later in the week and then he developed intense discomfort around this area.He tells me he is using his compression pumps 03/16/18; the patient has completed his doxycycline. The infectious part of this/cellulitis in the left heel area left popliteal area is a lot better. He has 2 open areas on the right calf. Still areas on the left calf but this is a lot better as well. 03/24/18; the patient arrives complaining of pain in the left popliteal area again. He thinks some of this is wrap injury. He has no open area on the right leg and really no open area on the left calf either except for the popliteal area. He claims to be compliant with the compression pumps 03/31/18; I gave him doxycycline last week because of cellulitis in the left popliteal area. This is a lot better although the surface epithelium is denuded off and response to this. He  arrives today with uncontrolled edema in the right calf area as well as a fingernail injury in the right lateral calf. There is only a few open areas on the left 04/06/18; I gave him amoxicillin doxycycline over the last 2 weeks that the amoxicillin should be completing currently. He is not complaining of any pain or systemic symptoms. The only open areas see has is on the right lateral lower leg paradoxically I cannot see anything on the left lower leg. He tells me he is using his compression pumps twice a day on most days. Silver alginate to the wounds that are open under 4 layer compression 04/13/18; he completed antibiotics and has no new complaints. Using his compression pumps. Silver alginate that anything that's opened 04/20/18; he is using his compression pumps religiously. Silver alginate 4 layer compression anything that's opened. He comes in today with no open wounds on the left leg but 3 on the right including a new one posteriorly. He has 2 on the right lateral and one on the right posterior. He likes Unna boots on the right leg for reasons that aren't really clear we had the usual 4 layer compression on the left. It may be necessary to move to the 4 layer compression on the right however for now I left them in the Unna boots 04/27/18; he is using his compression pumps at least once a day. He has still the wounds on the right lateral calf. The area right posteriorly has closed. He does not have an open wound on the left under 4 layer compression however on the dorsal left foot just proximal to the toes and the left third toe 2 small open areas were identified 05/11/18; he has not uses compression pumps. The areas  on the right lateral calf have coalesced into one large wound necrotic surface. On the left side he has one small wound anteriorly however the edema is now weeping out of a large part of his left leg. He says he wasn't using his pumps because of the weeping fluid. I explained to him  that this is the time he needs to pump more 05/18/18; patient states he is using his compression pumps twice a day. The area on the right lateral large wound albeit superficial. On the left side he has innumerable number of small new wounds on the left calf particularly laterally but several anteriorly and medially. All these appear to have healthy granulated base these look like the remnants of blisters however they occurred under compression. The patient arrives in clinic today with his legs somewhat better. There is certainly less edema, less multiple open areas on the left calf and the right anterior leg looks somewhat better as well superficial and a little smaller. However he relates pain and erythema over the last 3-4 days in the thigh and I looked at this today. He has not been systemically unwell no fever no chills no change in blood sugar values 05/25/18; comes in today in a better state. The severe cellulitis on his left leg seems better with the Keflex. Not as tender. He has not been systemically unwell ooHard to find an open wound on the left lower leg using his compression pumps twice a day ooThe confluent wounds on his right lateral calf somewhat better looking. These will ultimately need debridement I didn't do this today. 06/01/18; the severe cellulitis on the left anterior thigh has resolved and he is completed his Keflex. ooThere is no open wound on the left leg however there is a superficial excoriation at the base of the third toe dorsally. Skin on the bottom of his left foot is macerated looking. ooThe left the wounds on the lateral right leg actually looks some better although he did require debridement of the top half of this wound area with an open curet 06/09/18 on evaluation today patient appears to be doing poorly in regard to his right lower extremity in particular this appears to likely be infected he has very thick purulent discharge along with a bright green tent to the  discharge. This makes me concerned about the possibility of pseudomonas. He's also having increased discomfort at this point on evaluation. Fortunately there does not appear to be any evidence of infection spreading to the other location at this time. 06/16/18 on evaluation today patient appears to actually be doing fairly well. His ulcer has actually diminished in size quite significantly at this point which is good news. Nonetheless he still does have some evidence of infection he did see infectious disease this morning before coming here for his appointment. I did review the results of their evaluation and their note today. They did actually have him discontinue the Cipro and initiate treatment with linezolid at this time. He is doing this for the next seven days and they recommended a follow-up in four months with them. He is the keep a log of the need for intermittent antibiotic therapy between now and when he falls back up with infectious disease. This will help them gaze what exactly they need to do to try and help them out. 06/23/18; the patient arrives today with no open wounds on the left leg and left third toe healed. He is been using his compression pumps twice a day. On  the right lateral leg he still has a sizable wound but this is a lot better than last time I saw this. 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 Has been using silver alginate under 4 layer compression 06/30/18; the only open wound he has is on the right lateral leg and this looks healthy. No debridement is required. We have been using silver alginate. He does not have an open wound on the left leg. There is apparently some drainage from the dorsal proximal third toe on the left although I see no open wound here. 07/03/18 on evaluation today patient was actually here just for a nurse visit rapid change. However when he was  here on Wednesday for his rat change due to having been healed on the left and then developing blisters we initiated the wrap again knowing that he would be back today for [place] to reevaluate and see were at. Unfortunately he has developed some cellulitis into the proximal portion of his right lower extremity even into the region of his thigh. 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 He was placed on one as what at that point. Nonetheless he is done with that and has been tolerating it well otherwise. Doxycycline which in the past really did not seem to be effective for him. Nonetheless I think the best option may be for [place] to definitely reinitiate the antibiotics for a longer period of time. 07/07/18; since I last saw this patient a week ago he has had a difficult time. At that point he did not have an open wound on his left leg. We transitioned him into juxta light stockings. He was apparently in the clinic the next day with blisters on the left lateral and left medial lower calf. He also had weeping edema fluid. He was put back into a compression wrap. He was also in the clinic on Friday with intense erythema in his right thigh. Per the patient he was started on Bactrim however that didn't work at all in terms of relieving his pain and swelling. He has taken 3 doxycycline that he had left over from last time and that seems to of helped. He has blistering on the right thigh as well. 07/14/18; the erythema on his right thigh has gotten better with doxycycline that he is finishing. 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 Presumably this cellulitis in the thigh was not related to that although I think this is a bit concerning going forward. He still has an area on the right lateral calf the blister on the right medial calf just below the knee that was discussed above. On the left 2 small  open areas left medial and left lateral. Edema control is adequate. He is using his compression pumps twice a day 07/20/18; continued improvement in the condition of both legs especially the edema in his bilateral thighs. He tells me he is been losing weight through a combination of diet and exercise. He is using his compression pumps twice a day. So overall she made to the remaining wounds 07/27/2018; continued improvement in condition of both legs. His edema is well controlled. The area on the right lateral leg is just about closed he had one blisters show up on the medial left upper calf.  We have him in 4 layer compression. He is going on a 10-day trip to [place], [place] and [place]. He will be driving. He wants to wear Unna boots because of the lessening amount of constriction. He will not use compression pumps while he is away 08/05/18 on evaluation today patient actually appears to be doing decently well all things considered in regard to his bilateral lower extremities. The worst ulcer is actually only posterior aspect of his left lower extremity with a four layer compression wrap cut into his leg a couple weeks back. He did have a trip and actually had [employer] for the trip that he is worn since he was last here. Nonetheless he feels like the [employer] actually do better for him his swelling is up a little bit but he also with his trip was not taking his Lasix on a regular set schedule like he was supposed to be. He states that obviously the reason being that he cannot drive and keep going without having to urinate too frequently which makes it difficult. He did not have his pumps with him while he was away either which I think also maybe playing a role here too. 08/13/2018; the patient only has a small open wound on the right lateral calf which is a big improvement in the last month or 2. He also has the area posteriorly just below the posterior fossa on the left which I  think was a wrap injury from several weeks ago. He has no current evidence of cellulitis. He tells me he is back into his compression pumps twice a day. He also tells me that while he was at the laundromat somebody stole a section of his extremitease stockings 08/20/2018; back in the clinic with a much improved state. He only has small areas on the right lateral mid calf which is just about healed. This was is more substantial area for quite a prolonged period of time. He has a small open area on the left anterior tibia. The area on the posterior calf just below the popliteal fossa is closed today. He is using his compression pumps twice a day 08/28/2018; patient has no open wound on the right leg. He has a smattering of open areas on the calf with some weeping lymphedema. More problematically than that it looks as though his wraps of slipped down in his usual he has very angry upper area of edema just below the right medial knee and on the right lateral calf. He has no open area on his feet. The patient is traveling to [facility] next week. I will send him in an antibiotic. We will continue to wrap the right leg. We ordered extremitease stockings for him last week and I plan to transition the right leg to a stocking when he gets home which will be in 10 days time. As usual he is very reluctant to take his pumps with him when he travels 09/07/2018; patient returns from [place]. He shows me a picture of his left leg in the mid part of his trip last week with intense fire engine erythema. The picture look bad enough I would have considered sending him to the hospital. Instead he went to the wound care center in [facility]. They did not prescribe him antibiotics but he did take some doxycycline he had leftover from a previous visit. I had given him trimethoprim sulfamethoxazole before he left this did not work according to the patient.  This is resulted in some improvement fortunately. He  comes back with a large wound on the left posterior calf. Smaller area on the left anterior tibia. Denuded blisters on the dorsal left foot over his toes. Does not have much in the way of wounds on the right leg although he does have a very tender area on the right posterior area just below the popliteal fossa also suggestive of infection. He promises me he is back on his pumps twice a day 09/15/2018; the intense cellulitis in his left lower calf is a lot better. The wound area on the posterior left calf is also so better. However he has reasonably extensive wounds on the dorsal aspect of his second and third toes and the proximal foot just at the base of the toes. There is nothing open on the right leg 09/22/2018; the patient has excellent edema control in his legs bilaterally. He is using his external compression pumps twice a day. He has no open area on the right leg and only the areas in the left foot dorsally second and third toe area on the left side. He does not have any signs of active cellulitis. 10/06/2018; the patient has good edema control bilaterally. He has no open wound on the right leg. There is a blister in the posterior aspect of his left calf that we had to deal with today. He is using his compression pumps twice a day. There is no signs of active cellulitis. We have been using silver alginate to the wound areas. He still has vulnerable areas on the base of his left first second toes dorsally He has a his extremities stockings and we are going to transition him today into the stocking on the right leg. He is cautioned that he will need to continue to use the compression pumps twice a day. If he notices uncontrolled edema in the right leg he may need to go to 3 times a day. 10/13/2018; the patient came in for a nurse check on Friday he has a large flaccid blister on the right medial calf just below the knee. We unroofed this. He has this and a new area underneath the posterior mid calf  which was undoubtedly a blister as well. He also has several small areas on the right which is the area we put his extremities stocking on. 10/19/2018; the patient went to see infectious disease this morning I am not sure if that was a routine follow-up in any case the doxycycline I had given him was discontinued and started on linezolid. He has not started this. It is easy to look at his left calf and the inflammation and think this is cellulitis however he is very tender in the tissue just below the popliteal fossa and I have no doubt that there is infection going on here. He states the problem he is having is that with the compression pumps the edema goes down and then starts walking the wrap falls down. We will see if we can adhere this. He has 1 or 2 minuscule open areas on the right still areas that are weeping on the posterior left calf, the base of his left second and third toes 10/26/18; back today in clinic with quite of skin breakdown in his left anterior leg. This may have been infection the area below the popliteal fossa seems a lot better however tremendous epithelial loss on the left anterior mid tibia area over quite inexpensive tissue. He has 2 blisters on the right  side but no other open wound here. 10/29/2018; came in urgently to see [place] today and we worked him in for review. He states that the 4 layer compression on the right leg caused pain he had to cut it down to roughly his mid calf this caused swelling above the wrap and he has blisters and skin breakdown today. As a result of the pain he has not been using his pumps. Both legs are a lot more edematous and there is a lot of weeping fluid. 11/02/18; arrives in clinic with continued difficulties in the right leg> left. Leg is swollen and painful. multiple skin blisters and new open areas especially laterally. He has not been using his pumps on the right leg. 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 He is not systemically unwell. 11/09/2018; the patient claims he is being compliant with his pumps. He is finished the doxycycline I gave him last week. Culture I did of the wound on the right lateral leg showed a few very resistant methicillin staph aureus. This was resistant to doxycycline. Nevertheless he states the pain in the leg is a lot better which makes me wonder if the cultured organism was not really what was causing the problem nevertheless this is a very dangerous organism to be culturing out of any wound. His right leg is still a lot larger than the left. He is using an Radio broadcast assistant on this area, he blames a 4-layer compression for causing the original skin breakdown which I doubt is true however I cannot talk him out of it. We have been using silver alginate to all of these areas which were initially blisters 11/16/2018; patient is being compliant with his external compression pumps at twice a day. Miraculously he arrives in clinic today with absolutely no open wounds. He has better edema control on the left where he has been using 4 layer compression versus wound of wounds on the right and I pointed this out to him. There is no inflammation in the skin in his lower legs which is also somewhat unusual for him. There is no open wounds on the dorsal left foot. He has extremitease stockings at home and I have asked him to bring these in next week. 11/25/18 patient's lower extremity on examination today on the left appears for the most part to be wound free. He does have an open wound on the lateral aspect of the right lower extremity but this is minimal compared to what I've seen in past. He does request that we go ahead and wrap the left leg as well even though there's nothing open just so hopefully it will not reopen in short order. 1/28; patient has superficial open wounds on the right lateral calf left anterior calf and left posterior calf. His edema control is adequate. He  has an area of very tender erythematous skin at the superior upper part of his calf compatible with his recurrent cellulitis. We have been using silver alginate as the primary dressing. He claims compliance with his compression pumps 2/4; patient has superficial open wounds on numerous areas of his left calf and again one on the left dorsal foot. The areas on the right lateral calf have healed. The cellulitis that I gave him doxycycline for last week is also resolved this was mostly on the left anterior calf just below the tibial tuberosity. His edema looks fairly well-controlled. He tells me he went to see his primary doctor today  and had blood work ordered 2/11; once again he has several open areas on the left calf left tibial area. Most of these are small and appear to have healthy granulation. He does not have anything open on the right. The edema and control in his thighs is pretty good which is usually a good indication he has been using his pumps as requested. 2/18; he continues to have several small areas on the left calf and left tibial area. Most of these are small healthy granulation. We put him in his stocking on the right leg last week and he arrives with a superficial open area over the right upper tibia and a fairly large area on the right lateral tibia in similar condition. His edema control actually does not look too bad, he claims to be using his compression pumps twice a day 2/25. Continued small areas on the left calf and left tibial area. New areas especially on the right are identified just below the tibial tuberosity and on the right upper tibia itself. There are also areas of weeping edema fluid even without an obvious wound. He does not have a considerable degree of lymphedema but clearly there is more edema here than his skin can handle. He states he is using the pumps twice a day. We have an Unna boot on the right and 4 layer compression on the left. 3/3; he continues to  have an area on the right lateral calf and right posterior calf just below the popliteal fossa. There is a fair amount of tenderness around the wound on the popliteal fossa but I did not see any evidence of cellulitis, could just be that the wrap came down and rubbed in this area. ooHe does not have an open area on the left leg however there is an area on the left dorsal foot at the base of the third toe ooWe have been using silver alginate to all wound areas 3/10; he did not have an open area on his left leg last time he was here a week ago. T oday he arrives with a horizontal wound just below the tibial tuberosity and an area on the left lateral calf. He has intense erythema and tenderness in this area. The area is on the right lateral calf and right posterior calf better than last week. We have been using silver alginate as usual 3/18 - Patient returns with 3 small open areas on left calf, and 1 small open area on right calf, the skin looks ok with no significant erythema, he continues the UNA boot on right and 4 layer compression on left. The right lateral calf wound is closed , the right posterior is small area. we will continue silver alginate to the areas. 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 He is been tolerating the dressing changes without complication. Fortunately she has made excellent improvement in regard to the overall status of his wounds. Unfortunately every time we cease wrapping him he ends up reopening in causing more significant issues at that point. Again I'm unsure of the best direction to take although I think the lymphedema clinic may be appropriate for him. 02/03/19 on evaluation today patient appears to be doing well in regard to the wounds that we saw him for last week  unfortunately he has a new area  on the proximal portion of his right medial/posterior lower extremity where the wrap somewhat slowed down and caused swelling and a blister to rub and open. Unfortunately this is the only opening that he has on either leg at this point. 02/17/19 on evaluation today patient's bilateral lower extremities appear to be doing well. He still completely healed in regard to the left lower extremity. In regard to the right lower extremity the area where the wrap and slid down and caused the blister still seems to be slightly open although this is dramatically better than during the last evaluation two weeks ago. I'm very pleased with the way this stands overall. 03/03/19 on evaluation today patient appears to be doing well in regard to his right lower extremity in general although he did have a new blister open this does not appear to be showing any evidence of active infection at this time. Fortunately there's No fevers, chills, nausea, or vomiting noted at this time. Overall I feel like he is making good progress it does feel like that the right leg will we perform the [employer] seems to do with a bit better than three layer wrap on the left which slid down on him. We may switch to doing bilateral in the book wraps. 5/4; I have not seen Mr. Oscarson in quite some time. According to our case manager he did not have an open wound on his left leg last week. He had 1 remaining wound on the right posterior medial calf. He arrives today with multiple openings on the left leg probably were blisters and/or wrap injuries from Unna boots. I do not think the Unna boot's will provide adequate compression on the left. I am also not clear about the frequency he is using the compression pumps. 03/17/19 on evaluation today patient appears to be doing excellent in regard to his lower extremities compared to last week's evaluation apparently. He had gotten significantly worse last week which is  unfortunate. The [employer] wrap on the left did not seem to do very well for him at all and in fact it didn't control his swelling significantly enough he had an additional outbreak. Subsequently we go back to the four layer compression wrap on the left. This is good news. At least in that he is doing better and the wound seem to be killing him. He still has not heard anything from the lymphedema clinic. 03/24/19 on evaluation today patient actually appears to be doing much better in regard to his bilateral lower Trinity as compared to last week when I saw him. Fortunately there's no signs of active infection at this time. He has been tolerating the dressing changes without complication. Overall I'm extremely pleased with the progress and appearance in general. 04/07/19 on evaluation today patient appears to be doing well in regard to his bilateral lower extremities. His swelling is significantly down from where it was previous. With that being said he does have a couple blisters still open at this point but fortunately nothing that seems to be too severe and again the majority of the larger openings has healed at this time. 04/14/19 on evaluation today patient actually appears to be doing quite well in regard to his bilateral lower extremities in fact I'm not even sure there's anything significantly open at this time at any site. Nonetheless he did have some trouble with these wraps where they are somewhat irritating him secondary to the fact that he has noted that the graph wasn't too close  down to the end of this foot in a little bit short as well up to his knee. Otherwise things seem to be doing quite well. 04/21/19 upon evaluation today patient's wound bed actually showed evidence of being completely healed in regard to both lower extremities which is excellent news. There does not appear to be any signs of active infection which is also good news. I'm very pleased in this regard. No fevers, chills,  nausea, or vomiting noted at this time. 04/28/19 on evaluation today patient appears to be doing a little bit worse in regard to both lower extremities on the left mainly due to the fact that when he went infection disease the wrap was not wrapped quite high enough he developed a blister above this. On the right he is a small open area of nothing too significant but again this is continuing to give him some trouble he has been were in the Velcro compression that he has at home. 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 He's been tolerating the dressing changes without complication. Fortunately there's no signs of active infection at this time. No fevers, chills, nausea, or vomiting noted at this time. We have been trying to get an appointment with her lymphedema clinic in [facility] but unfortunately nobody can get them on phone with not been able to even fax information over the patient likewise is not been able to get in touch with them. Overall I'm not sure exactly what's going on here with to reach out again today. 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 Still having a lot of drainage unfortunately. He tells me especially in the left but even on the right. There's no signs of active infection which is good news we've been using so ratcheted up to this point. 05/19/19 on evaluation today patient actually appears to be doing quite well with regard to his left lower extremity which is great news. Fortunately in regard to the right lower extremity has an issues with his wrap and he subsequently did remove this from what I'm understanding. Nonetheless long story short is what he had rewrapped once he removed it subsequently had maggots underneath this wrap whenever he came in for evaluation today. With that being said they were obviously  completely cleaned away by the nursing staff. The visit today which is excellent news. However he does appear to potentially have some infection around the right ankle region where the maggots were located as well. He will likely require anabiotic therapy today. 05/26/19 on evaluation today patient actually appears to be doing much better in regard to his bilateral lower extremities. I feel like the infection is under much better control. With that being said there were maggots noted when the wrap was removed yet again today. Again this could have potentially been left over from previous although at this time there does not appear to be any signs of significant drainage there was obviously on the wrap some drainage as well this contracted gnats or otherwise. Either way I do not see anything that appears to be doing worse in my pinion and in fact I think his drainage has slowed down quite significantly likely mainly due to the fact to his infection being under better control. 06/02/2019 on evaluation today patient actually appears to be doing well with regard to his bilateral lower extremities there is  no signs of active infection at this time which is great news. With that being said he does have several open areas more so on the right than the left but nonetheless these are all significantly better than previously noted. 06/09/2019 on evaluation today patient actually appears to be doing well. His wrap stayed up and he did not cause any problems he had more drainage on the right compared to the left but overall I do not see any major issues at this time which is great news. 06/16/2019 on evaluation today patient appears to be doing excellent with regard to his lower extremities the only area that is open is a new blister that can have opened as of today on the medial ankle on the left. Other than this he really seems to be doing great I see no major issues at this point. 06/23/2019 on evaluation today  patient appears to be doing quite well with regard to his bilateral lower extremities. In fact he actually appears to be almost completely healed there is a small area of weeping noted of the right lower extremity just above the ankle. Nonetheless fortunately there is no signs of active infection at this time which is good news. No fevers, chills, nausea, vomiting, or diarrhea. 8/24; the patient arrived for a nurse visit today but complained of very significant pain in the left leg and therefore I was asked to look at this. Noted that he did not have an open area on the left leg last week nevertheless this was wrapped. The patient states that he is not been able to put his compression pumps on the left leg because of the discomfort. He has not been systemically unwell 06/30/2019 on evaluation today patient unfortunately despite being excellent last week is doing much worse with regard to his left lower extremity today. In fact he had to come in for a nurse on Monday where his left leg had to be rewrapped due to excessive weeping Dr. Leanord Hawking placed him on doxycycline at that point. Fortunately there is no signs of active infection Systemically at this time which is good news. 07/07/2019 in regard to the patient's wounds today he actually seems to be doing well with his right lower extremity there really is nothing open or draining at this point this is great news. Unfortunately the left lower extremity is given him additional trouble at this time. There does not appear to be any signs of active infection nonetheless he does have a lot of edema and swelling noted at this point as well as blistering all of which has led to a much more poor appearing leg at this time compared to where it was 2 weeks ago when it was almost completely healed. Obviously this is a little discouraging for the patient. He is try to contact the lymphedema clinic in [place] he has not been able to get through to them. 07/14/2019 on  evaluation today patient actually appears to be doing slightly better with regard to his left lower extremity ulcers. Overall I do feel like at least at the top of the wrap that we have been placing this area has healed quite nicely and looks much better. The remainder of the leg is showing signs of improvement. Unfortunately in the thigh area he still has an open region on the left and again on the right he has been utilizing just a Band-Aid on an area that also opened on the thigh. Again this is an area that were not able  to wrap although we did do an Ace wrap to provide some compression that something that obviously is a little less effective than the compression wraps we have been using on the lower portion of the leg. He does have an appointment with the lymphedema clinic in [facility] on Friday. 07/21/2019 on evaluation today patient appears to be doing better with regard to his lower extremity ulcers. He has been tolerating the dressing changes without complication. Fortunately there is no signs of active infection at this time. No fevers, chills, nausea, vomiting, or diarrhea. I did receive the paperwork from the physical therapist at the lymphedema clinic in [place]. Subsequently I signed off on that this morning and sent that back to him for further progression with the treatment plan. 07/28/2019 on evaluation today patient appears to be doing very well with regard to his right lower extremity where I do not see any open wounds at this point. Fortunately he is feeling great as far as that is concerned as well. In regard to the left lower extremity he has been having issues with still several areas of weeping and edema although the upper leg is doing better his lower leg still I think is going require the compression wrap at this time. No fevers, chills, nausea, vomiting, or diarrhea. 08/04/2019 on evaluation today patient unfortunately is having new wounds on the right lower extremity.  Again we have been using Unna boot wrap on that side. We switched him to using his juxta lite wrap at home. With that being said he tells me he has been using it although his legs extremely swollen and to be honest really does not appear that he has been. I cannot know that for sure however. Nonetheless he has multiple new wounds on the right lower extremity at this time. Obviously we will have to see about getting this rewrapped for him today. 08/11/2019 on evaluation today patient appears to be doing fairly well with regard to his wounds. He has been tolerating the dressing changes including the compression wraps without complication. He still has a lot of edema in his upper thigh regions bilaterally he is supposed to be seeing the lymphedema clinic on the 15th of this month once his wraps arrive for the upper part of his legs. 08/18/2019 on evaluation today patient appears to be doing well with regard to his bilateral lower extremities at this point. He has been tolerating the dressing changes without complication. Fortunately there is no signs of active infection which is also good news. He does have a couple weeping areas on the first and second toe of the right foot he also has just a small area on the left foot upper leg and a small area on the left lower leg but overall he is doing quite well in my opinion. He is supposed to be getting his wraps shortly in fact tomorrow and then subsequently is seeing the lymphedema clinic next Wednesday on the 21st. Of note he is also leaving on the 25th to go on vacation for a week to the beach. For that reason and since there is some uncertainty about what there can be doing at lymphedema clinic next Wednesday I am get a make an appointment for next Friday here for [place] to see what we need to do for him prior to him leaving for vacation. 10/23; patient arrives in considerable pain predominantly in the upper posterior calf just distal to the popliteal fossa also  in the wound anteriorly above the major  wound. This is probably cellulitis and he has had this recurrently in the past. He has no open wound on the right side and he has had an Unna boot in that area. Finally I note that he has an area on the left posterior calf which by enlarge is mostly epithelialized. This protrudes beyond the borders of the surrounding skin in the setting of dry scaly skin and lymphedema. The patient is leaving for [facility] on Sunday. Per his longstanding pattern, he will not take his compression pumps with him predominantly out of fear that they will be stolen. He therefore asked that we put a Unna boot back on the right leg. He will also contact the wound care center in [facility] to see if they can change his dressing in the mid week. 11/3; patient returned from his vacation to [facility] He was seen on 1 occasion at their wound care center. They did a 2 layer compression system as they did not have our 4-layer wrap. I am not completely certain what they put on the wounds. They did not change the Unna boot on the right. The patient is also seeing a lymphedema specialist physical therapist in [place]. It appears that he has some compression sleeve for his thighs which indeed look quite a bit better than I am used to seeing. He pumps over these with his external compression pumps. 11/10; the patient has a new wound on the right medial thigh otherwise there is no open areas on the right. He has an area on the left leg posteriorly anteriorly and medially and an area over the left second toe. We have been using silver alginate. He thinks the injury on his thigh is secondary to friction from the compression sleeve he has. 11/17; the patient has a new wound on the right medial thigh last week. He thinks this is because he did not have a underlying stocking for his thigh juxta lite apparatus. He now has this. The area is fairly large and somewhat angry but I do not think  he has underlying cellulitis. ooHe has a intact blister on the right anterior tibial area. ooSmall wound on the right great toe dorsally ooSmall area on the medial left calf. 11/30; the patient does not have any open areas on his right leg and we did not take his juxta lite stocking off. However he states that on Friday his compression wrap fell down lodging around his upper mid calf area. As usual this creates a lot of problems for him. He called urgently today to be seen for a nurse visit however the nurse visit turned into a provider visit because of extreme erythema and pain in the left anterior tibia extending laterally and posteriorly. The area that is problematic is extensive 10/06/2019 upon evaluation today patient actually appears to be doing poorly in regard to his left lower extremity. He Dr. Leanord Hawking did place him on doxycycline this past Monday apparently due to the fact that he was doing much worse in regard to this left leg. Fortunately the doxycycline does seem to be helping. Unfortunately we are still having a very difficult time getting his edema under any type of control in order to anticipate discharge at some point. The only way were really able to control his lymphedema really is with compression wraps and that has only even seemingly temporary. He has been seeing a lymphedema clinic they are trying to help in this regard but still this has been somewhat frustrating in  general for the patient. 10/13/19 on evaluation today patient appears to be doing excellent with regard to his right lower extremity as far as the wounds are concerned. His swelling is still quite extensive unfortunately. He is still having a lot of drainage from the thigh areas bilaterally which is unfortunate. He's been going to lymphedema clinic but again he still really does not have this edema under control as far as his lower extremities are concern. With regard to his left lower extremity this seems to be  improving and I do believe the doxycycline has been of benefit for him. He is about to complete the doxycycline. 10/20/2019 on evaluation today patient appears to be doing poorly in regard to his bilateral lower extremities. More in the right thigh he has a lot of irritation at this site unfortunately. In regard to the left lower extremity the wrap was not quite as high it appears and does seem to have caused him some trouble as well. Fortunately there is no evidence of systemic infection though he does have some blue-green drainage which has me concerned for the possibility of Pseudomonas. He tells me he is previously taking Cipro without complications and he really does not care for Levaquin however due to some of the side effects he has. He is not allergic to any medications specifically antibiotics that were aware of. 10/27/2019 on evaluation today patient actually does appear to be for the most part doing better when compared to last week's evaluation. With that being said he still has multiple open wounds over the bilateral lower extremities. He actually forgot to start taking the Cipro and states that he still has the whole bottle. He does have several new blisters on left lower extremity today I think I would recommend he go ahead and take the Cipro based on what I am seeing at this point. 12/30-Patient comes at 1 week visit, 4 layer compression wraps on the left and Unna boot on the right, primary dressing Xtrasorb and silver alginate. Patient is taking his Cipro and has a few more days left probably 5-6, and the legs are doing better. He states he is using his compressions devices which I believe he has 11/10/2019 on evaluation today patient actually appears to be much better than last time I saw him 2 weeks ago. His wounds are significantly improved and overall I am very pleased in this regard. Fortunately there is no signs of active infection at this time. He is just a couple of days away  from completing Cipro. Overall his edema is much better he has been using his lymphedema pumps which I think is also helping at this point. 11/17/2019 on evaluation today patient appears to be doing excellent in regard to his wounds in general. His legs are swollen but not nearly as much as they have been in the past. Fortunately he is tolerating the compression wraps without complication. No fevers, chills, nausea, vomiting, or diarrhea. He does have some erythema however in the distal portion of his right lower extremity specifically around the forefoot and toes there is a little bit of warmth here as well. 11/24/2019 on evaluation today patient appears to be doing well with regard to his right lower extremity I really do not see any open wounds at this point. His left lower extremity does have several open areas and his right medial thigh also is open. Other than this however overall the patient seems to be making good progress and I am very pleased at  this point. 12/01/2019 on evaluation today patient appears to be doing poorly at this point in regard to his left lower extremity has several new blisters despite the fact that we have him in compression wraps. In fact he had a 4-layer compression wrap, his upper thigh wrapped from lymphedema clinic, and a juxta light over top of the 4 layer compression wrap the lymphedema clinic applied and despite all this he still develop blisters underneath. Obviously this does have me concerned about the fact that unfortunately despite what we are doing to try to get wounds healed he continues to have new areas arise I do not think he is ever good to be at the point where he can realistically just use wraps at home to keep things under control. Typically when we heal him it takes about 1-2 days before he is back in the clinic with severe breakdown and blistering of his lower extremities bilaterally. This is happened numerous times in the past. Unfortunately I  think that we may need some help as far as overall fluid overload to kind of limit what we are seeing and get things under better control. 12/08/2019 on evaluation today patient presents for follow-up concerning his ongoing bilateral lower extremity edema. Unfortunately he is still having quite a bit of swelling the compression wraps are controlling this to some degree but he did see Dr. [name] his cardiologist I do have that available for review today as far as the appointment was concerned that was on 12/06/2019. Obviously that she has been 2 days ago. The patient states that he is only been taking the Lasix 80 mg 1 time a day he had told me previously he was taking this twice a day. Nonetheless Dr. [name] recommended this be up to 80 mg 2 times a day for the patient as he did appear to be fluid overloaded. With that being said the patient states he did this yesterday and he was unable to go anywhere or do anything due to the fact that he was constantly having to urinate. Nonetheless I think that this is still good to be something that is important for him as far as trying to get his edema under control at all things that he is going to be able to just expect his wounds to get under control and things to be better without going through at least a period of time where he is trying to stabilize his fluid management in general and I think increasing the Lasix is likely the first step here. It was also mentioned the possibility that the patient may require metolazone. With that being said he wanted to have the patient take Lasix twice a day first and then reevaluating 2 months to see where things stand. 12/15/2019 upon evaluation today patient appears to be doing regard to his legs although his toes are showing some signs of weeping especially on the left at this point to some degree on the right. There does not appear to be any signs of active infection and overall I do feel like the compression wraps are doing  well for him but he has not been able to take the Lasix at home and the increased dose that Dr. [name] recommended. He tells me that just not go to be feasible for him. Nonetheless I think in this case he should probably send a message to Dr. [name] in order to discuss options from the standpoint of possible admission to get the fluid off or otherwise going forward.  12/22/2019 upon evaluation today patient appears to be doing fairly well with regard to his lower extremities at this point. In fact he would be doing excellent if it was not for the fact that his right anterior thigh apparently had an allergic reaction to adhesive tape that he used. The wound itself that we have been monitoring actually appears to be healed. There is a lot of irritation at this point. 12/29/2019 upon evaluation today patient appears to be doing well in regard to his lower extremities. His left medial thigh is open and somewhat draining today but this is the only region that is open the right has done much better with the treatment utilizing the steroid cream that I prescribed for him last week. Overall I am pleased in that regard. Fortunately there is no signs of active infection at this time. No fevers, chills, nausea, vomiting, or diarrhea. 01/05/2020 upon evaluation today patient appears to be doing more poorly in regard to his right lower extremity at this point upon evaluation today. Unfortunately he continues to have issues in this regard and I think the biggest issue is controlling his edema. This obviously is not very well controlled at this point is been recommended that he use the Lasix twice a day but he has not been able to do that. Unfortunately I think this is leading to an issue where honestly he is not really able to effectively control his edema and therefore the wounds really are not doing significantly better. I do not think that he is going to be able to keep things under good control unless he is able to  control his edema much better. I discussed this again in great detail with him today. 01/12/2020 good news is patient actually appears to be doing quite well today at this point. He does have an appointment with lymphedema clinic tomorrow. His legs appear healed and the toe on the left is almost completely healed. In general I am very pleased with how things stand at this point. 01/19/2020 upon evaluation today patient appears to actually be doing well in regard to his lower extremities there is nothing open at this point. Fortunately he has done extremely well more recently. Has been seeing lymphedema clinic as well. With that being said he has Velcro wraps for his lower legs as well as his upper legs. The only wound really is on his toe which is the right great toe and this is barely anything even there. With all that being said I think it is good to be appropriate today to go ahead and switch him over to the Velcro compression wraps. 01/26/2020 upon evaluation today patient appears to be doing worse with regard to his lower extremities after last week switch him to Velcro compression wraps. Unfortunately he lasted less than 24 hours he did not have the sock portion of his Velcro wrap on the left leg and subsequently developed a blister underneath the Velcro portion. Obviously this is not good and not what we were looking for at this point. He states the lymphedema clinic did tell him to wear the wrap for 23 hours and take him off for 1 I am okay with that plan but again right now we got a get things back under control again he may have some cellulitis noted as well. 02/02/2020 upon evaluation today patient unfortunately appears to have several areas of blistering on his bilateral lower extremities today mainly on the feet. His legs do seem to be doing somewhat  better which is good news. Fortunately there is no evidence of active infection at this time. No fevers, chills, nausea, vomiting, or  diarrhea. 02/16/2020 upon evaluation today patient appears to be doing well at this time with regard to his legs. He has a couple weeping areas on his toes but for the most part everything is doing better and does appear to be sealed up on his legs which is excellent news. We can continue with wrapping him at this point as he had every time we discontinue the wraps he just breaks out with new wounds. There is really no point in is going forward with this at this point. 03/08/2020 upon evaluation today patient actually appears to be doing quite well with regard to his lower extremity ulcers. He has just a very superficial and really almost nonexistent blister on the left lower extremity he has in general done very well with the compression wraps. With that being said I do not see any signs of infection at this time which is good news. 03/29/2020 upon evaluation today patient appears to be doing well with regard to his wounds currently except for where he had several new areas that opened up due to some of the wrap slipping and causing him trouble. He states he did not realize they had slipped. Nonetheless he has a 1 area on the right and 3 new areas on the left. Fortunately there is no signs of active infection at this time which is great news. 04/05/2020 upon evaluation today patient actually appears to be doing quite well in general in regard to his legs currently. Fortunately there is no signs of active infection at this time. No fevers, chills, nausea, vomiting, or diarrhea. He tells me next week that he will actually be seen in the lymphedema clinic on Thursday at 10 AM I see him on Wednesday next week. 04/12/2020 upon evaluation today patient appears to be doing very well with regard to his lower extremities bilaterally. In fact he does not appear to have any open wounds at this point which is good news. Fortunately there is no signs of active infection at this time. No fevers, chills, nausea, vomiting,  or diarrhea. 04/19/2020 upon evaluation today patient appears to be doing well with regard to his wounds currently on the bilateral lower extremities. There does not appear to be any signs of active infection at this time. Fortunately there is no evidence of systemic infection and overall very pleased at this point. Nonetheless after I held him out last week he literally had blisters the next morning already which swelled up with him being right back here in the clinic. Overall I think that he is just not can be able to be discharged with his legs the way they are he is much to volume overloaded as far as fluid is concerned and that was discussed with him today of also discussed this but should try the clinic nurse manager as well as Dr. Leanord Hawking. 04/26/2020 upon evaluation today patient appears to be doing better with regard to his wounds currently. He is making some progress and overall swelling is under good control with the compression wraps. Fortunately there is no evidence of active infection at this time. 05/10/2020 on evaluation today patient appears to be doing overall well in regard to his lower extremities bilaterally. He is Tolerating the compression wraps without complication and with what we are seeing currently I feel like that he is making excellent progress. There is no signs of  active infection at this time. 05/24/2020 upon evaluation today patient appears to be doing well in regard to his legs. The swelling is actually quite a bit down compared to where it has been in the past. Fortunately there is no sign of active infection at this time which is also good news. With that being said he does have several wounds on his toes that have opened up at this point. 05/31/2020 upon evaluation today patient appears to be doing well with regard to his legs bilaterally where he really has no significant fluid buildup at this point overall he seems to be doing quite well. Very pleased in this regard.  With regard to his toes these also seem to be drying up which is excellent. We have continue to wrap him as every time we tried as a transition to the juxta light wraps things just do not seem to get any better. 06/07/2020 upon evaluation today patient appears to be doing well with regard to his right leg at this point. Unfortunately left leg has a lot of blistering he tells me the wrap started to slide down on him when he tried to put his other Velcro wrap over top of it to help keep things in order but nonetheless still had some issues. 06/14/2020 on evaluation today patient appears to be doing well with regard to his lower extremity ulcers and foot ulcers at this point. I feel like everything is actually showing signs of improvement which is great news overall there is no signs of active infection at this time. No fevers, chills, nausea, vomiting, or diarrhea. 06/21/2020 on evaluation today patient actually appears to be doing okay in regard to his wounds in general. With that being said the biggest issue I see is on his right foot in particular the first and second toe seem to be doing a little worse due to the fact this is staying very wet. I think he is probably getting need to change out his dressings a couple times in between each week when we see him in regard to his toes in order to keep this drier based on the location and how this is proceeding. 06/28/2020 on evaluation today patient appears to be doing a little bit more poorly overall in regard to the appearance of the skin I am actually somewhat concerned about the possibility of him having a little bit of an infection here. We discussed the course of potentially giving him a doxycycline prescription which he is taken previously with good result. With that being said I do believe that this is potentially mild and at this point easily fixed. I just do not want anything to get any worse. 07/12/2020 upon evaluation today patient actually appears  to be making some progress with regard to his legs which is great news there does not appear to be any evidence of active infection. Overall very pleased with where things stand. 07/26/2020 upon evaluation today patient appears to be doing well with regard to his leg ulcers and toe ulcers at this point. He has been tolerating the compression wraps without complication overall very pleased in this regard. 08/09/2020 upon evaluation today patient appears to be doing well with regard to his lower extremities bilaterally. Fortunately there is no signs of active infection overall I am pleased with where things stand. 08/23/2020 on evaluation today patient appears to be doing well with regard to his wound. He has been tolerating the dressing changes without complication. Fortunately there is no signs of  active infection at this time. Overall his legs seem to be doing quite well which is great news and I am very pleased in that regard. No fevers, chills, nausea, vomiting, or diarrhea. 09/13/2020 upon evaluation today patient appears to be doing okay in regard to his lower extremities. He does have a fairly large blister on the right leg which I did remove the blister tissue from today so we can get this to dry out other than that however he seems to be doing quite well. There is no signs of active infection at this time. 09/27/2020 upon evaluation today patient appears to actually be doing some better in regard to his right leg. Fortunately signs of active infection at this time which is great news. No fevers, chills, nausea, vomiting, or diarrhea. 10/04/2020 upon evaluation today patient actually appears to be showing signs of improvement which is great news with regard to his leg ulcers. Fortunately there is no signs of active infection which is great news he is still taking the antibiotics currently. No fevers, chills, nausea, vomiting, or diarrhea. 10/18/2020 on evaluation today patient appears to be doing  well with regard to his legs currently. He has been tolerating the dressing changes including the wraps without complication. Fortunately there is no signs of active infection at this time. No fevers, chills, nausea, vomiting, or diarrhea. 10/25/2020 upon evaluation today patient appears to be doing decently well in regard to his wounds currently. He has been tolerating the dressing changes without complication. Overall I feel like he is making good progress albeit slow. Again this is something we can have to continue to wrap for some time to come most likely. 11/08/2020 upon evaluation today patient appears to be doing well with regard to his wounds currently. He has been tolerating the dressing changes without complication is not currently on any antibiotics and he does not appear to show any signs of infection. He does continue to have a lot of drainage on the right leg not too severe but nonetheless this is very scattered. On the left leg this is looking to be much improved overall. 11/15/2020 upon evaluation today patient appears to be doing better with regard to his legs bilaterally. Especially the right leg which was much more significant last week. There does not appear to be any signs of active infection which is great news. No fevers, chills, nausea, vomiting, or diarrhea. 11/23/2019 upon evaluation today patient appears to be doing poorly still in regard to his lower extremities bilaterally. Unfortunately his right leg in particular appears to be doing much more poorly there is no signs really of infection this is not warm to touch but he does have a lot of drainage and weeping unfortunately. With that reason I do believe that we may need to initiate some treatment here to try to help calm down some of the swelling of the right leg. I think switching to a 4-layer compression wrap would be beneficial here. The patient is in agreement with giving this a try. 11/29/2020 upon evaluation today patient  appears to be doing well currently in regard to his leg ulcers. I feel like the right leg is doing better he still has a lot of drainage but we do see some improvement here. The 4-layer compression wrap I think was helpful. 12/06/2020 upon evaluation today patient appears to be doing well with regard to his legs. In fact they seem to be doing about the best I have seen up to this point. Fortunately there  is no signs of active infection at this time. No fevers, chills, nausea, vomiting, or diarrhea. 12/20/2020 upon evaluation today patient appears to be doing well at this time in regard to his legs. He is not having any significant draining which is great news. Fortunately there is no signs of active infection at this time. No fevers, chills, nausea, vomiting, or diarrhea. 01/17/2021 upon evaluation today Tajai actually appears to be doing excellent in regard to his legs. He has a few areas again that come and go as far as his toes are concerned but overall this is doing quite well. 01/31/2021 upon evaluation today patient appears to be doing well with regard to his legs. Fortunately there does not appear to be any signs of active infection which is great news. Overall he is still having significant edema despite the compression wraps basically the 4-layer compression wrap to just keep things under control there is really not much room for play. 4/13: Mr. [name] is a longstanding patient in our clinic and benefits greatly from weekly compression wraps. Today he has no complaints. He has been tolerating the wraps well. He states he is using the lymphedema pumps at home. 5/4; patient presents for follow-up of his chronic lymphedema/venous insufficiency ulcers. He comes weekly for compression wraps. He has no complaints today. He was unable to tolerate the Coflex 2 layer Last week so we will do the four press 4-layer compression. He has been using his lymphedema pumps daily. 5/18; patient presents for 2-week  follow-up. He has no complaints or issues today. He has developed a new wound to the right foot on his fourth toe. He overall feels well and denies signs of infection. 6/1; patient presents for 2-week follow-up. He has no complaints or issues today. He denies signs of infection. 04/18/2021 upon evaluation today patient appears to be doing well with regard to his legs bilaterally. Family open wound is actually on the toe of his left foot everything else is completely closed which is great news. In general I am extremely pleased with where things stand at this point. The patient is also happy that things are doing so well. 05/02/2021 upon evaluation today patient's legs actually appear to be doing quite well today. Fortunately there does not appear to be any signs of active infection which is great and overall I am extremely pleased with where he stands today. The patient does not appear to have any evidence of active infection at this time which is also great news. 05/09/2021 upon evaluation today patient appears to be doing a little bit more poorly in regard to his legs. Unfortunately he is having issues with some breakdown and a blood blister on the left leg this is due to I believe honestly to how it was wrapped last week. Fortunately there does not appear to be any signs of infection but nonetheless this is still a concern to be honest. No fevers, chills, nausea, vomiting, or diarrhea. Objective Constitutional Obese and well-hydrated in no acute distress. Vitals Time Taken: 11:02 AM, Height: 70 in, Weight: 380.2 lbs, BMI: 54.5, Temperature: 97.9 F, Pulse: 71 bpm, Respiratory Rate: 20 breaths/min, Blood Pressure: 133/73 mmHg, Capillary Blood Glucose: 150 mg/dl. Respiratory normal breathing without difficulty. Psychiatric this patient is able to make decisions and demonstrates good insight into disease process. Alert and Oriented x 3. pleasant and cooperative. General Notes: Patient's wound bed  showed signs of good granulation and epithelization underneath the blood blister this does not appear to be too deep  this is on the left. In general I think that the patient's legs do not look quite as good as they did last time I saw him but again were not really seeing any signs either of infection right now we will continue to keep a close eye on this but I think with appropriate wrapping and using alginate we should be able to turn this around. Integumentary (Hair, Skin) Wound #193 status is Open. Original cause of wound was Gradually Appeared. The date acquired was: 01/31/2021. The wound has been in treatment 14 weeks. The wound is located on the Left T Second. The wound measures 0.1cm length x 0.1cm width x 0.1cm depth; 0.008cm^2 area and 0.001cm^3 volume. There oe is Fat Layer (Subcutaneous Tissue) exposed. There is no tunneling or undermining noted. There is a medium amount of serosanguineous drainage noted. The wound margin is distinct with the outline attached to the wound base. There is large (67-100%) red, pink granulation within the wound bed. There is no necrotic tissue within the wound bed. Wound #197 status is Open. Original cause of wound was Gradually Appeared. The date acquired was: 05/09/2021. The wound is located on the Left,Anterior Lower Leg. The wound measures 4cm length x 5cm width x 0.1cm depth; 15.708cm^2 area and 1.571cm^3 volume. There is Fat Layer (Subcutaneous Tissue) exposed. There is no tunneling or undermining noted. There is a medium amount of sanguinous drainage noted. The wound margin is distinct with the outline attached to the wound base. There is large (67-100%) red granulation within the wound bed. There is no necrotic tissue within the wound bed. 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 - with Luana Shu Shower/ Hygiene: May shower with protection but do not get wound dressing(s) wet. Edema Control - Lymphedema / SCD / Other: Lymphedema Pumps. Use Lymphedema pumps on leg(s) 2-3 times a day for 45-60 minutes. If wearing any wraps or hose, do not remove them. Continue exercising as instructed. Elevate legs to the level of the heart or above for 30 minutes daily and/or when sitting, a frequency of: - throughout the day Avoid standing for long periods of time. Exercise regularly Non Wound Condition: Other Non Wound Condition Orders/Instructions: - lotion to both legs, 4 layer compression wraps both legs, silver alginate to any weeping areas WOUND #193: - T Second Wound Laterality: Left oe Prim Dressing: KerraCel Ag Gelling Fiber Dressing, 2x2 in (silver alginate) 1 x Per Week/15 Days ary Discharge Instructions: Apply silver alginate to wound bed as instructed Secondary Dressing: Woven Gauze Sponges 2x2 in 1 x Per Week/15 Days Discharge Instructions: Apply over primary dressing as directed. Secured With: Insurance underwriter, Sterile 2x75 (in/in) 1 x Per Week/15 Days Discharge Instructions: Secure with stretch gauze as directed. WOUND #197: - Lower Leg Wound Laterality: Left, Anterior Peri-Wound Care: Sween Lotion (Moisturizing lotion) 1 x Per Week/30 Days Discharge Instructions: Apply moisturizing lotion as directed Prim Dressing: KerraCel Ag Gelling Fiber Dressing, 4x5 in (silver alginate) 1 x Per Week/30 Days ary Discharge Instructions: Apply silver alginate to wound bed as  instructed Secondary Dressing: Woven Gauze Sponge, Non-Sterile 4x4 in 1 x Per Week/30 Days Discharge Instructions: Apply over primary dressing as directed. Secondary Dressing: ABD Pad, 5x9 1 x Per Week/30 Days Discharge Instructions: Apply over primary dressing as directed. Com pression Wrap: FourPress (4 layer compression wrap) 1 x Per Week/30 Days Discharge Instructions: Apply four layer compression as directed. May also use [place] CoFlex 2 layer compression system as alternative. 1. Would recommend currently that we going continue with the wound care measures as before the patient is in agreement with the plan this includes the use of the silver alginate dressing to the open wound locations. 2. Also can recommend ABD pads to cover any open wound areas. 3. I would also suggest that the patient continue to monitor for any signs of worsening in regard to infection such as increased pain if he has any issues he should let me know soon as possible. We will see patient back for reevaluation in 1 week here in the clinic. If anything worsens or changes patient will contact our office for additional recommendations. Electronic Signature(s) Signed: 05/09/2021 11:40:45 AM By: Lenda Kelp PA-C Entered By: Lenda Kelp on 05/09/2021 11:40:45 -------------------------------------------------------------------------------- SuperBill Details Patient Name: Date of Service: [name], Rohaan J. 05/09/2021 Medical Record Number: [id number] Patient Account Number: [id number] Date of Birth/Sex: Treating RN: 1951/01/16 (70 y.o. 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

## 2021-05-16 ENCOUNTER — Other Ambulatory Visit: Payer: Self-pay

## 2021-05-16 ENCOUNTER — Ambulatory Visit: Payer: Medicare Other

## 2021-05-16 ENCOUNTER — Encounter (HOSPITAL_BASED_OUTPATIENT_CLINIC_OR_DEPARTMENT_OTHER): Payer: Medicare Other | Admitting: Physician Assistant

## 2021-05-16 DIAGNOSIS — L97522 Non-pressure chronic ulcer of other part of left foot with fat layer exposed: Secondary | ICD-10-CM | POA: Diagnosis not present

## 2021-05-16 DIAGNOSIS — L97822 Non-pressure chronic ulcer of other part of left lower leg with fat layer exposed: Secondary | ICD-10-CM | POA: Diagnosis not present

## 2021-05-16 DIAGNOSIS — I87312 Chronic venous hypertension (idiopathic) with ulcer of left lower extremity: Secondary | ICD-10-CM | POA: Diagnosis not present
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
## 2021-05-16 NOTE — Progress Notes (Addendum)
[name], [name] ([id number]) Visit Report for 05/16/2021 Chief Complaint Document Details Patient Name: Date of Service: [name], [name] 05/16/2021 10:30 A M Medical Record Number: [id number] Patient Account Number: [id number] Date of Birth/Sex: Treating RN: 01/22/1951 (70 y.o. [name] Primary Care Provider: Nicoletta Ba Other Clinician: Referring Provider: Treating Provider/Extender: Adele Dan in Treatment: [phone] Information Obtained from: Patient Chief Complaint patient is here for evaluation of venous/lymphedema ulcers Electronic Signature(s) Signed: 05/16/2021 10:39:15 AM By: Lenda Kelp PA-C Entered By: Lenda Kelp on 05/16/2021 10:39:14 -------------------------------------------------------------------------------- HPI Details Patient Name: Date of Service: [name], [name] 05/16/2021 10:30 A M Medical Record Number: [id number] Patient Account Number: [id number] Date of Birth/Sex: Treating RN: 11-06-1950 (70 y.o. [name] Primary Care Provider: Nicoletta Ba Other Clinician: Referring Provider: Treating Provider/Extender: Adele Dan in Treatment: 277 History of Present Illness HPI Description: Referred by PCP for consultation. Patient has long standing history of BLE venous stasis, no prior ulcerations. At beginning of month, developed cellulitis and weeping. Received IM Rocephin followed by Keflex and resolved. Wears compression stocking, appr 6 months old. Not sure strength. No present drainage. 01/22/16 this is a patient who is a type II diabetic on insulin. 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 He tells me he religiously wears pressure stockings of uncertain strength. He was here with weeping edema about 8 months ago but did not have an open wound. Roughly a month ago he had a reopening on his bilateral legs. He is been using bandages and Neosporin. He  does not complain of pain. 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 Last BUN/creatinine I have is from 11/20/15 at 13 and 1.0 respectively 01/29/16; patient arrives today having tolerated the Profore wrap. He brought in his stockings and these are 18 mmHg stockings he bought from [place]. The compression here is likely inadequate. He does not complain of pain or excessive drainage she has no systemic symptoms. The wound on the right looks improved as does the one on the left although one on the left is more substantial with still tissue at risk below the actual wound area on the bilateral posterior calf 02/05/16; patient arrives with poor edema control. He states that we did put a 4 layer compression on it last week. No weight appear 5 this. 02/12/16; the area on the posterior right Has healed. The left Has a substantial wound that has necrotic surface eschar that requires a debridement with a curette. 02/16/16;the patient called or a Nurse visit secondary to increased swelling. He had been in earlier in the week with his right leg healed. He was transitioned to is on pressure stocking on the right leg with the only open wound on the left, a substantial area on the left posterior calf. Note he has a history of severe lower extremity edema, he has a history of chronic atrial fibrillation but not heart failure per my notes but I'll need to research this. He is not complaining of chest pain shortness of breath or orthopnea. The intake nurse noted blisters on the previously closed right leg 02/19/16; this is the patient's regular visit day. I see him on Friday with escalating edema new wounds on the right leg and clear signs of at least right ventricular heart failure. I increased his Lasix to 40 twice a  day. He is returning currently in follow-up. States he is noticed a decrease in that the  edema 02/26/16 patient's legs have much less edema. There is nothing really open on the right leg. The left leg has improved condition of the large superficial wound on the posterior left leg 03/04/16; edema control is very much better. The patient's right leg wounds have healed. On the left leg he continues to have severe venous inflammation on the posterior aspect of the left leg. There is no tenderness and I don't think any of this is cellulitis. 03/11/16; patient's right leg is married healed and he is in his own stocking. The patient's left leg has deteriorated somewhat. There is a lot of erythema around the wound on the posterior left leg. There is also a significant rim of erythema posteriorly just above where the wrap would've ended there is a new wound in this location and a lot of tenderness. Can't rule out cellulitis in this area. 03/15/16; patient's right leg remains healed and he is in his own stocking. The patient's left leg is much better than last review. His major wound on the posterior aspect of his left Is almost fully epithelialized. He has 3 small injuries from the wraps. Really. Erythema seems a lot better on antibiotics 03/18/16; right leg remains healed and he is in his own stocking. The patient's left leg is much better. The area on the posterior aspect of the left calf is fully epithelialized. His 3 small injuries which were wrap injuries on the left are improved only one seems still open his erythema has resolved 03/25/16; patient's right leg remains healed and he is in his own stocking. There is no open area today on the left leg posterior leg is completely closed up. His wrap injuries at the superior aspect of his leg are also resolved. He looks as though he has some irritation on the dorsal ankle but this is fully epithelialized without evidence of infection. 03/28/16; we discharged this patient on Monday. Transitioned him into his own stocking. There were problems almost  immediately with uncontrolled swelling weeping edema multiple some of which have opened. He does not feel systemically unwell in particular no chest pain no shortness of breath and he does not feel 04/08/16; the edema is under better control with the Profore light wrap but he still has pitting edema. There is one large wound anteriorly 2 on the medial aspect of his left leg and 3 small areas on the superior posterior calf. Drainage is not excessive he is tolerating a Profore light well 04/15/16; put a Profore wrap on him last week. This is controlled is edema however he had a lot of pain on his left anterior foot most of his wounds are healed 04/22/16 once again the patient has denuded areas on the left anterior foot which he states are because his wrap slips up word. He saw his primary physician today is on Lasix 40 twice a day and states that he his weight is down 20 pounds over the last 3 months. 04/29/16: Much improved. left anterior foot much improved. He is now on Lasix 80 mg per day. Much improved edema control 05/06/16; I was hoping to be able to discharge him today however once again he has blisters at a low level of where the compression was placed last week mostly on his left lateral but also his left medial leg and a small area on the anterior part of the left foot. 05/09/16; apparently the patient  went home after his appointment on 7/4 later in the evening developing pain in his upper medial thigh together with subjective fever and chills although his temperature was not taken. The pain was so intense he felt he would probably have to call 911. However he then remembered that he had leftover doxycycline from a previous round of antibiotics and took these. By the next morning he felt a lot better. He called and spoke to one of our nurses and I approved doxycycline over the phone thinking that this was in relation to the wounds we had previously seen although they were definitely were not. The  patient feels a lot better old fever no chills he is still working. Blood sugars are reasonably controlled 05/13/16; patient is back in for review of his cellulitis on his anterior medial upper thigh. He is taking doxycycline this is a lot better. Culture I did of the nodular area on the dorsal aspect of his foot grew MRSA this also looks a lot better. 05/20/16; the patient is cellulitis on the medial upper thigh has resolved. All of his wound areas including the left anterior foot, areas on the medial aspect of the left calf and the lateral aspect of the calf at all resolved. He has a new blister on the left dorsal foot at the level of the fourth toe this was excised. No evidence of infection 05/27/16; patient continues to complain weeping edema. He has new blisterlike wounds on the left anterior lateral and posterior lateral calf at the top of his wrap levels. The area on his left anterior foot appears better. He is not complaining of fever, pain or pruritus in his feet. 05/30/16; the patient's blisters on his left anterior leg posterior calf all look improved. He did not increase the Lasix 100 mg as I suggested because he was going to run out of his 40 mg tablets. He is still having weeping edema of his toes 06/03/16; I renewed his Lasix at 80 mg once a day as he was about to run out when I last saw him. He is on 80 mg of Lasix now. I have asked him to cut down on the excessive amount of water he was drinking and asked him to drink according to his thirst mechanisms 06/12/2016 -- was seen 2 days ago and was supposed to wear his compression stockings at home but he is developed lymphedema and superficial blisters on the left lower extremity and hence came in for a review 06/24/16; the remaining wound is on his left anterior leg. He still has edema coming from between his toes. There is lymphedema here however his edema is generally better than when I last saw this. He has a history of atrial fibrillation  but does not have a known history of congestive heart failure nevertheless I think he probably has this at least on a diastolic basis. 07/01/16 I reviewed his echocardiogram from January 2017. This was essentially normal. He did not have LVH, EF of 55-60%. His right ventricular function was normal although he did have trivial tricuspid and pulmonic regurgitation. This is not audible on exam however. I increased his Lasix to do massive edema in his legs well above his knees I think in early July. He was also drinking an excessive amount of water at the time. 07/15/16; missed his appointment last week because of the Labor Day holiday on Monday. He could not get another appointment later in the week. Started to feel the wrap digging in superiorly  so we remove the top half and the bottom half of his wrap. He has extensive erythema and blistering superiorly in the left leg. Very tender. Very swollen. Edema in his foot with leaking edema fluid. He has not been systemically unwell 07/22/16; the area on the left leg laterally required some debridement. The medial wounds look more stable. His wrap injury wounds appear to have healed. Edema and his foot is better, weeping edema is also better. He tells me he is meeting with the supplier of the external compression pumps at work 08/05/16; [place] His wrap is been on for an extended period of time. Also over the weekend he developed an extensive area of tender erythema across his anterior medial thigh. He took to doxycycline yesterday that he had leftover from a previous prescription. The patient complains of weeping edema coming out of his toes 08/08/16; I saw this patient on 10/2. He was tender across his anterior thigh. I put him on doxycycline. He returns today in follow-up. He does not have any open wounds on his lower leg, he still has edema weeping into his toes. 08/12/16; patient was seen back urgently today to  follow-up for his extensive left thigh cellulitis/erysipelas. He comes back with a lot less swelling and erythema pain is much better. I believe I gave him Augmentin and Cipro. His wrap was cut down as he stated a roll down his legs. He developed blistering above the level of the wrap that remained. He has 2 open blisters and 1 intact. 08/19/16; patient is been doing his primary doctor who is increased his Lasix from 40-80 once a day or 80 already has less edema. Cellulitis has remained improved in the left thigh. 2 open areas on the posterior left calf 08/26/16; he returns today having new open blisters on the anterior part of his left leg. He has his compression pumps but is not yet been shown how to use some vital representative from the supplier. 09/02/16 patient returns today with no open wounds on the left leg. Some maceration in his plantar toes 09/10/2016 -- Dr. Leanord Hawkingobson had recently discharged him on 09/02/2016 and he has come right back with redness swelling and some open ulcers on his left lower extremity. He says this was caused by trying to apply his compression stockings and he's been unable to use this and has not been able to use his lymphedema pumps. He had some doxycycline leftover and he has started on this a few days ago. 09/16/16; there are no open wounds on his leg on the left and no evidence of cellulitis. 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 He does not complain of symptoms here. I am not clear use using his external compression pumps. 09/23/16; I have not seen this patient in 2 weeks. He canceled his appointment 10 days ago as he was going on vacation. He tells me that on Monday he noticed a large area on his posterior left leg which is been draining copiously and is reopened into a large wound. He is been using ABDs and the external part of his juxtalite, according to our nurse this was not on properly. 10/07/16;  Still a substantial area on the posterior left leg. Using silver alginate 10/14/16; in general better although there is still open area which looks healthy. Still using silver alginate. He reminds me that this happen [place] T oday while he was showering in the morning. He had been using his juxtalite's 10/21/16; the area on his posterior left leg is fully epithelialized. However he arrives today with a large area of tender erythema in his medial and posterior left thigh just above the knee. I have marked the area. Once again he is reluctant to consider hospitalization. I treated him with oral antibiotics in the past for a similar situation with resolution I think with doxycycline however this area it seems more extensive to me. He is not complaining of fever but does have chills and says states he is thirsty. His blood sugar today was in the 140s at home 10/25/16 the area on his posterior left leg is fully epithelialized although there is still some weeping edema. The large area of tenderness and erythema in his medial and posterior left thigh is a lot less tender although there is still a lot of swelling in this thigh. He states he feels a lot better. He is on doxycycline and Augmentin that I started last week. This will continued until Tuesday, December 26. I have ordered a duplex ultrasound of the left thigh rule out DVT whether there is an abscess something that would need to be drained I would also like to know. 11/01/16; he still has weeping edema from a not fully epithelialized area on his left posterior calf. Most of the rest of this looks a lot better. He has completed his antibiotics. His thigh is a lot better. Duplex ultrasound did not show a DVT in the thigh 11/08/16; he comes in today with more Denuded surface epithelium from the posterior aspect of his calf. There is no real evidence of cellulitis. The superior aspect of his wrap appears to have put quite an  indentation in his leg just below the knee and this may have contributed. He does not complain of pain or fever. We have been using silver alginate as the primary dressing. The area of cellulitis in the right thigh has totally resolved. He has been using his compression stockings once a week 11/15/16; the patient arrives today with more loss of epithelium from the posterior aspect of his left calf. He now has a fairly substantial wound in this area. The reason behind this deterioration isn't exactly clear although his edema is not well controlled. He states he feels he is generally more swollen systemically. He is not complaining of chest pain shortness of breath fever. T me he has an appointment with his primary physician in early February. He is on 80 mg of oral ells Lasix a day. He claims compliance with the external compression pumps. He is not having any pain in his legs similar to what he has with his recurrent cellulitis 11/22/16; the patient arrives a follow-up of his large area on his left lateral calf. This looks somewhat better today. He came in earlier in the week for a dressing change since I saw him a week ago. He is not complaining of any pain no shortness of breath no chest pain 11/28/16; the patient arrives for follow-up of his large area on the left lateral calf this does not look better. In fact it is larger weeping edema. The surface of the wound does not look too bad. We have been using silver alginate although I'm not certain that this is a dressing issue. 12/05/16; again the patient follows up for a large wound on the left lateral and left posterior calf this does not look better. There  continues to be weeping edema necrotic surface tissue. More worrisome than this once again there is erythema below the wound involving the distal Achilles and heel suggestive of cellulitis. He is on his feet working most of the day of this is not going well. We are changing his dressing twice a week to  facilitate the drainage. 12/12/16; not much change in the overall dimensions of the large area on the left posterior calf. This is very inflamed looking. I gave him an. Doxycycline last week does not really seem to have helped. He found the wrap very painful indeed it seems to of dog into his legs superiorly and perhaps around the heel. He came in early today because the drainage had soaked through his dressings. 12/19/16- patient arrives for follow-up evaluation of his left lower extremity ulcers. He states that he is using his lymphedema pumps once daily when there is "no drainage". He admits to not using his lipedema pumps while under current treatment. His blood sugars have been consistently between 150-200. 12/26/16; the patient is not using his compression pumps at home because of the wetness on his feet. I've advised him that I think it's important for him to use this daily. He finds his feet too wet, he can put a plastic bag over his legs while he is in the pumps. Otherwise I think will be in a vicious circle. We are using silver alginate to the major area on his left posterior calf 01/02/17; the patient's posterior left leg has further of all into 3 open wounds. All of them covered with a necrotic surface. He claims to be using his compression pumps once a day. His edema control is marginal. Continue with silver alginate 01/10/17; the patient's left posterior leg actually looks somewhat better. There is less edema, less erythema. Still has 3 open areas covered with a necrotic surface requiring debridement. He claims to be using his compression pumps once a day his edema control is better 01/17/17; the patient's left posterior calf look better last week when I saw him and his wrap was changed 2 days ago. He has noted increasing pain in the left heel and arrives today with much larger wounds extensive erythema extending down into the entire heel area especially tender medially. He is not  systemically unwell CBGs have been controlled no fever. Our intake nurse showed me limegreen drainage on his AVD pads. 01/24/17; his usual this patient responds nicely to antibiotics last week giving him Levaquin for presumed Pseudomonas. The whole entire posterior part of his leg is much better much less inflamed and in the case of his Achilles heel area much less tender. He has also had some epithelialization posteriorly there are still open areas here and still draining but overall considerably better 01/31/17- He has continue to tolerate the compression wraps. he states that he continues to use the lymphedema pumps daily, and can increase to twice daily on the weekends. He is voicing no complaints or concerns regarding his LLE ulcers 02/07/17-he is here for follow-up evaluation. He states that he noted some erythema to the left medial and anterior thigh, which he states is new as of yesterday. He is concerned about recurrent cellulitis. He states his blood sugars have been slightly elevated, this morning in the 180s 02/14/17; he is here for follow-up evaluation. When he was last here there was erythema superiorly from his posterior wound in his anterior thigh. He was prescribed Levaquin however a culture of the wound surface grew MRSA over  the phone I changed him to doxycycline on Monday and things seem to be a lot better. 02/24/17; patient missed his appointment on Friday therefore we changed his nurse visit into a physician visit today. Still using silver alginate on the large area of the posterior left thigh. He isn't new area on the dorsal left second toe 03/03/17; actually better today although he admits he has not used his external compression pumps in the last 2 days or so because of work responsibilities over the weekend. 03/10/17; continued improvement. External compression pumps once a day almost all of his wounds have closed on the posterior left calf. Better edema control 03/17/17; in general  improved. He still has 3 small open areas on the lateral aspect of his left leg however most of the area on the posterior part of his leg is epithelialized. He has better edema control. He has an ABD pad under his stocking on the right anterior lower leg although he did not let us look at that today. 03/24/17; patient arrives back in clinic today with no open areas however there are areas on the posterior left calf and anterior left calf that are less than 100% epithelialized. His edema is well controlled in the left lower leg. There is some pitting edema probably lymphedema in the left upper thigh. He uses compression pumps at home once per day. I tried to get him to do this twice a day although he is very reticent. 04/01/2017 -- for the last 2 days he's had significant redness, tenderness and weeping and came in for an urgent visit today. 04/07/17; patient still has 6 more days of doxycycline. He was seen by Dr. Meyer Russel last Wednesday for cellulitis involving the posterior aspect, lateral aspect of his Involving his heel. For the most part he is better there is less erythema and less weeping. He has been on his feet for 12 hours 2 over the weekend. Using his compression pumps once a day 04/14/17 arrives today with continued improvement. Only one area on the posterior left calf that is not fully epithelialized. He has intense bilateral venous inflammation associated with his chronic venous insufficiency disease and secondary lymphedema. We have been using silver alginate to the left posterior calf wound In passing he tells [place] today that the right leg but we have not seen in quite some time has an open area on it but he doesn't want [place] to look at this today states he will show this to [place] next week. 04/21/17; there is no open area on his left leg although he still reports some weeping edema. He showed [place] his right leg today which is the first time we've seen this leg in a long time. He has a large area of  open wound on the right leg anteriorly healthy granulation. Quite a bit of swelling in the right leg and some degree of venous inflammation. He told us about the right leg in passing last week but states that deterioration in the right leg really only happened over the weekend 04/28/17; there is no open area on the left leg although there is an irritated part on the posterior which is like a wrap injury. The wound on the right leg which was new from last week at least to [place] is a lot better. 05/05/17; still no open area on the left leg. Patient is using his new compression stocking which seems to be doing a good job of controlling the edema. He states he is using his  compression pumps once per day. The right leg still has an open wound although it is better in terms of surface area. Required debridement. A lot of pain in the posterior right Achilles marked tenderness. Usually this type of presentation this patient gives concern for an active cellulitis 05/12/17; patient arrives today with his major wound from last week on the right lateral leg somewhat better. Still requiring debridement. He was using his compression stocking on the left leg however that is reopened with superficial wounds anteriorly he did not have an open wound on this leg previously. He is still using his juxta light's once daily at night. He cannot find the time to do this in the morning as he has to be at work by 7 AM 05/19/17; right lateral leg wound looks improved. No debridement required. The concerning area is on the left posterior leg which appears to almost have a subcutaneous hemorrhagic component to it. We've been using silver alginate to all the wounds 05/26/17; the right lateral leg wound continues to look improved. However the area on the left posterior calf is a tightly adherent surface. [place] using silver alginate. Because of the weeping edema in his legs there is very little good alternatives. 06/02/17; the patient left  here last week looking quite good. Major wound on the left posterior calf and a small one on the right lateral calf. Both of these look satisfactory. He tells me that by Wednesday he had noted increased pain in the left leg and drainage. He called on Thursday and Friday to get an appointment here but we were blocked. He did not go to urgent care or his primary physician. He thinks he had a fever on Thursday but did not actually take his temperature. He has not been using his compression pumps on the left leg because of pain. I advised him to go to the emergency room today for IV antibiotics for stents of left leg cellulitis but he has refused I have asked him to take 2 days off work to keep his leg elevated and he has refused this as well. In view of this I'm going to call him and Augmentin and doxycycline. 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 However after a detailed discussion regarding the risk benefits and alternatives he refuses to get admitted to the hospital. With no other recourse I will continue him on oral antibiotics as before and hopefully he'll have his infectious disease consultation this week. 06/16/2017 -- the patient was seen today by the nurse practitioner at infectious disease Ms. Dixon. 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 He has also been advise Lotrimin cream locally. she also had wise IV antibiotics for his condition if it flares up 06/23/17; patient arrives today with drainage bilaterally although the remaining wound on the left posterior calf after  cleaning up today "highlighter yellow drainage" did not look too bad. Unfortunately he has had breakdown on the right anterior leg [previously this leg had not been open and he is using a black stocking] he went to see infectious  disease and is been put on clindamycin 150 mg daily, I did not verify the dose although I'm not familiar with using clindamycin in this dosing range, perhaps for prophylaxisoo 06/27/17; I brought this patient back today to follow-up on the wound deterioration on the right lower leg together with surrounding cellulitis. I started him on doxycycline 4 days ago. This area looks better however he comes in today with intense cellulitis on the medial part of his left thigh. This is not have a wound in this area. Extremely tender. We've been using silver alginate to the wounds on the right lower leg left lower leg with bilateral 4 layer compression he is using his external compression pumps once a day 07/04/17; patient's left medial thigh cellulitis looks better. He has not been using his compression pumps as his insert said it was contraindicated with cellulitis. His right leg continues to make improvements all the wounds are still open. We only have one remaining wound on the left posterior calf. Using silver alginate to all open areas. He is on doxycycline which I started a week ago and should be finishing I gave him Augmentin after Thursday's visit for the severe cellulitis on the left medial thigh which fortunately looks better 07/14/17; the patient's left medial thigh cellulitis has resolved. The cellulitis in his right lower calf on the right also looks better. All of his wounds are stable to improved we've been using silver alginate he has completed the antibiotics I have given him. He has clindamycin 150 mg once a day prescribed by infectious disease for prophylaxis, I've advised him to start this now. We have been using bilateral Unna boots over silver alginate to the wound  areas 07/21/17; the patient is been to see infectious disease who noted his recurrent problems with cellulitis. He was not able to tolerate prophylactic clindamycin therefore he is on amoxicillin 500 twice a day. He also had a second daily dose of Lasix added By Dr. Oneta Rack but he is not taking this. Nor is he being completely compliant with his compression pumps a especially not this week. He has 2 remaining wounds one on the right posterior lateral lower leg and one on the left posterior medial lower leg. 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 The patient has Unna boots bilaterally. 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 He has bilateral Unna boots for compression and silver alginate to his wounds. Arrives today with his legs looking as good as I have seen him in quite some time. Not surprisingly his wounds look better as well with improvement on the right lateral leg venous insufficiency wound and also the left medial leg. He is still using the compression pumps once a day 08/11/17; both legs appear to be doing better wounds on the right lateral and left medial legs look better. Skin on the right leg quite good. He is been using silver alginate as the primary dressing. I'm going to use Anasept gel calcium alginate and maintain all the secondary dressings 08/18/17; the patient continues to actually do quite well. The area on his right lateral leg is just about closed the left medial also looks better although it is still moist in this area. His edema is well controlled we have been using Anasept gel with calcium alginate  and the usual secondary dressings, 4 layer compression and once daily use of his compression pumps "always been able to manage 09/01/17; the patient  continues to do reasonably well in spite of his trip to [place]. The area on the right lateral leg is epithelialized. Left is much better but still open. He has more edema and more chronic erythema on the left leg [venous inflammation] 09/08/17; he arrives today with no open wound on the right lateral leg and decently controlled edema. Unfortunately his left leg is not nearly as in his good situation as last week.he apparently had increasing edema starting on Saturday. He edema soaked through into his foot so used a plastic bag to walk around his home. The area on the medial right leg which was his open area is about the same however he has lost surface epithelium on the left lateral which is new and he has significant pain in the Achilles area of the left foot. He is already on amoxicillin chronically for prophylaxis of cellulitis in the left leg 09/15/17; he is completed a week of doxycycline and the cellulitis in the left posterior leg and Achilles area is as usual improved. He still has a lot of edema and fluid soaking through his dressings. There is no open wound on the right leg. He saw infectious disease NP today 09/22/17;As usual 1 we transition him from our compression wraps to his stockings things did not go well. He has several small open areas on the right leg. He states this was caused by the compression wrap on his skin although he did not wear this with the stockings over them. He has several superficial areas on the left leg medially laterally posteriorly. He does not have any evidence of active cellulitis especially involving the left Achilles The patient is traveling from [name] Saturday going to [facility]. He states he isn't attempting to get an appointment with a heel objects wound [name] there to change his dressings. I am not completely certain whether this will work 10/06/17; the patient came in on Friday for a nurse visit and the nurse reported that his legs actually look  quite good. He arrives in clinic today for his regular follow-up visit. He has a new wound on his left third toe over the PIP probably caused by friction with his footwear. He has small areas on the left leg and a very superficial but epithelialized area on the right anterior lateral lower leg. Other than that his legs look as good as I've seen him in quite some time. We have been using silver alginate Review of systems; no chest pain no shortness of breath other than this a 10 point review of systems negative 10/20/17; seen by Dr. Meyer Russel last week. He had taken some antibiotics [doxycycline] that he had left over. Dr. Meyer Russel thought he had candida infection and declined to give him further antibiotics. He has a small wound remaining on the right lateral leg several areas on the left leg including a larger area on the left posterior several left medial and anterior and a small wound on the left lateral. The area on the left dorsal third toe looks a lot better. ROS; Gen.; no fever, respiratory no cough no sputum Cardiac no chest pain other than this 10 point review of system is negative 10/30/17; patient arrives today having fallen in the bathtub 3 days ago. It took him a while to get up. He has pain and maceration in the wounds on his  left leg which have deteriorated. He has not been using his pumps he also has some maceration on the right lateral leg. 11/03/17; patient continues to have weeping edema especially in the left leg. This saturates his dressings which were just put on on 12/27. As usual the doxycycline seems to take care of the cellulitis on his lower leg. He is not complaining of fever, chills, or other systemic symptoms. He states his leg feels a lot better on the doxycycline I gave him empirically. He also apparently gets injections at his primary doctor's officeo Rocephin for cellulitis prophylaxis. I didn't ask him about his compression pump compliance today I think that's probably  marginal. Arrives in the clinic with all of his dressings primary and secondary macerated full of fluid and he has bilateral edema 11/10/17; the patient's right leg looks some better although there is still a cluster of wounds on the right lateral. The left leg is inflamed with almost circumferential skin loss medially to laterally although we are still maintaining anteriorly. He does not have overt cellulitis there is a lot of drainage. He is not using compression pumps. We have been using silver alginate to the wound areas, there are not a lot of options here 11/17/17; the patient's right leg continues to be stable although there is still open wounds, better than last week. The inflammation in the left leg is better. Still loss of surface layer epithelium especially posteriorly. There is no overt cellulitis in the amount of edema and his left leg is really quite good, tells me he is using his compression pumps once a day. 11/24/17; patient's right leg has a small superficial wound laterally this continues to improve. The inflammation in the left leg is still improving however we have continuous surface layer epithelial loss posteriorly. There is no overt cellulitis in the amount of edema in both legs is really quite good. He states he is using his compression pumps on the left leg once a day for 5 out of 7 days 12/01/17; very small superficial areas on the right lateral leg continue to improve. Edema control in both legs is better today. He has continued loss of surface epithelialization and left posterior calf although I think this is better. We have been using silver alginate with large number of absorptive secondary dressings 4 layer on the left Unna boot on the right at his request. He tells me he is using his compression pumps once a day 12/08/17; he has no open area on the right leg is edema control is good here. On the left leg however he has marked erythema and tenderness breakdown of skin. He has  what appears to be a wrap injury just distal to the popliteal fossa. 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 There was swelling erythema and breakdown in the right leg. He has much worse skin breakdown in the left leg as well multiple open areas medially and posteriorly as well as laterally. He tells me he has been using his compression pumps but tells me he feels that the drainage out of his leg is worse when he uses a compression pumps. T be fair to him he is been saying this o for a while however I  don't know that I have really been listening to this. I wonder if the compression pumps are working properly 12/22/17;. Once again he arrives with severe erythema, weeping edema from the left greater than right leg. Noncompliance with compression pumps. New this visit he is complaining of pain on the lateral aspect of the right leg and the medial aspect of his right thigh. He apparently saw his cardiologist Dr. [name] who was ordered an echocardiogram area and I think this is a step in the right direction 12/25/17; started his doxycycline Monday night. There is still intense erythema of the right leg especially in the anterior thigh although there is less tenderness. The erythema around the wound on the right lateral calf also is less tender. He still complaining of pain in the left heel. His wounds are about the same right lateral left medial left lateral. Superficial but certainly not close to closure. He denies being systemically unwell no fever chills no abdominal pain no diarrhea 12/29/17; back in follow-up of his extensive right calf and right thigh cellulitis. I added amoxicillin to cover possible doxycycline resistant  strep. This seems to of done the trick he is in much less pain there is much less erythema and swelling. He has his echocardiogram at 11:00 this morning. X-ray of the left heel was also negative. 01/05/18; the patient arrived with his edema under much better control. Now that he is retired he is able to use his compression pumps daily and sometimes twice a day per the patient. He has a wound on the right leg the lateral wound looks better. Area on the left leg also looks a lot better. He has no evidence of cellulitis in his bilateral thighs I had a quick peak at his echocardiogram. He is in normal ejection fraction and normal left ventricular function. He has moderate pulmonary hypertension moderately reduced right ventricular function. One would have to wonder about chronic sleep apnea although he says he doesn't snore. He'll review the echocardiogram with his cardiologist. 01/12/18; the patient arrives with the edema in both legs under exemplary control. He is using his compression pumps daily and sometimes twice daily. His wound on the right lateral leg is just about closed. He still has some weeping areas on the posterior left calf and lateral left calf although everything is just about closed here as well. I have spoken with Aldean Baker who is the patient's nurse practitioner and infectious disease. She was concerned that the patient had not understood that the parenteral penicillin injections he was receiving for cellulitis prophylaxis was actually benefiting him. I don't think the patient actually saw that I would tend to agree we were certainly dealing with less infections although he had a serious one last month. 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 He is healed. He'll be placed in juxtalite compression wraps and increase his lymphedema pumps to twice daily. We will follow up again next week to ensure there are no issues with the new  regiment. 01/20/18-he is here for evaluation of bilateral lower extremity weeping edema. Yesterday he was placed in compression wrap to the right lower extremity and compression stocking to left lower shrubbery. He states he uses lymphedema pumps last night and again this morning and noted a blister to the left lower extremity. On exam he was noted to have drainage to the right lower extremity. He will be placed in Unna boots bilaterally and follow-up next week 01/26/18; patient was actually discharged a  week ago to his own juxta light stockings only to return the next day with bilateral lower extremity weeping edema.he was placed in bilateral Unna boots. He arrives today with pain in the back of his left leg. There is no open area on the right leg however there is a linear/wrap injury on the left leg and weeping edema on the left leg posteriorly. I spoke with infectious disease about 10 days ago. They were disappointed that the patient elected to discontinue prophylactic intramuscular penicillin shots as they felt it was particularly beneficial in reducing the frequency of his cellulitis. I discussed this with the patient today. He does not share this view. He'll definitely need antibiotics today. Finally he is traveling to [place] and trauma leaving this Saturday and returning a week later and he does not travel with his pumps. He is going by car 01/30/18; patient was seen 4 days ago and brought back in today for review of cellulitis in the left leg posteriorly. I put him on amoxicillin this really hasn't helped as much as I might like. He is also worried because he is traveling to [name] [place] by car. Finally we will be rewrapping him. There is no open area on the right leg over his left leg has multiple weeping areas as usual 02/09/18; The same wrap on for 10 days. He did not pick up the last doxycycline I prescribed for him. He apparently took 4 days worth he already had. There is nothing open on  his right leg and the edema control is really quite good. He's had damage in the left leg medially and laterally especially probably related to the prolonged use of Unna boots 02/12/18; the patient arrived in clinic today for a nurse visit/wrap change. He complained of a lot of pain in the left posterior calf. He is taking doxycycline that I previously prescribed for him. Unfortunately even though he used his stockings and apparently used to compression pumps twice a day he has weeping edema coming out of the lateral part of his right leg. This is coming from the lower anterior lateral skin area. 02/16/18; the patient has finished his doxycycline and will finish the amoxicillin 2 days. The area of cellulitis in the left calf posteriorly has resolved. He is no longer having any pain. He tells me he is using his compression pumps at least once a day sometimes twice. 02/23/18; the patient finished his doxycycline and Amoxil last week. On Friday he noticed a small erythematous circle about the size of a quarter on the left lower leg just above his ankle. This rapidly expanded and he now has erythema on the lateral and posterior part of the thigh. This is bright red. Also has an area on the dorsal foot just above his toes and a tender area just below the left popliteal fossa. He came off his prophylactic penicillin injections at his own insistence one or 2 months ago. This is obviously deteriorated since then 03/02/18; patient is on doxycycline and Amoxil. Culture I did last week of the weeping area on the back of his left calf grew group B strep. I have therefore renewed the amoxicillin 500 3 times a day for a further week. He has not been systemically unwell. Still complaining of an area of discomfort right under his left popliteal fossa. There is no open wound on the right leg. He tells me that he is using his pumps twice a day on most days 03/09/18; patient arrives in clinic today completing  his amoxicillin  today. The cellulitis on his left leg is better. Furthermore he tells me that he had intramuscular penicillin shots that his primary care office today. However he also states that the wrap on his right leg fell down shortly after leaving clinic last week. He developed a large blister that was present when he came in for a nurse visit later in the week and then he developed intense discomfort around this area.He tells me he is using his compression pumps 03/16/18; the patient has completed his doxycycline. The infectious part of this/cellulitis in the left heel area left popliteal area is a lot better. He has 2 open areas on the right calf. Still areas on the left calf but this is a lot better as well. 03/24/18; the patient arrives complaining of pain in the left popliteal area again. He thinks some of this is wrap injury. He has no open area on the right leg and really no open area on the left calf either except for the popliteal area. He claims to be compliant with the compression pumps 03/31/18; I gave him doxycycline last week because of cellulitis in the left popliteal area. This is a lot better although the surface epithelium is denuded off and response to this. He arrives today with uncontrolled edema in the right calf area as well as a fingernail injury in the right lateral calf. There is only a few open areas on the left 04/06/18; I gave him amoxicillin doxycycline over the last 2 weeks that the amoxicillin should be completing currently. He is not complaining of any pain or systemic symptoms. The only open areas see has is on the right lateral lower leg paradoxically I cannot see anything on the left lower leg. He tells me he is using his compression pumps twice a day on most days. Silver alginate to the wounds that are open under 4 layer compression 04/13/18; he completed antibiotics and has no new complaints. Using his compression pumps. Silver alginate that anything that's opened 04/20/18; he is  using his compression pumps religiously. Silver alginate 4 layer compression anything that's opened. He comes in today with no open wounds on the left leg but 3 on the right including a new one posteriorly. He has 2 on the right lateral and one on the right posterior. He likes Unna boots on the right leg for reasons that aren't really clear we had the usual 4 layer compression on the left. It may be necessary to move to the 4 layer compression on the right however for now I left them in the Unna boots 04/27/18; he is using his compression pumps at least once a day. He has still the wounds on the right lateral calf. The area right posteriorly has closed. He does not have an open wound on the left under 4 layer compression however on the dorsal left foot just proximal to the toes and the left third toe 2 small open areas were identified 05/11/18; he has not uses compression pumps. The areas on the right lateral calf have coalesced into one large wound necrotic surface. On the left side he has one small wound anteriorly however the edema is now weeping out of a large part of his left leg. He says he wasn't using his pumps because of the weeping fluid. I explained to him that this is the time he needs to pump more 05/18/18; patient states he is using his compression pumps twice a day. The area on the right lateral  large wound albeit superficial. On the left side he has innumerable number of small new wounds on the left calf particularly laterally but several anteriorly and medially. All these appear to have healthy granulated base these look like the remnants of blisters however they occurred under compression. The patient arrives in clinic today with his legs somewhat better. There is certainly less edema, less multiple open areas on the left calf and the right anterior leg looks somewhat better as well superficial and a little smaller. However he relates pain and erythema over the last 3-4 days in the thigh  and I looked at this today. He has not been systemically unwell no fever no chills no change in blood sugar values 05/25/18; comes in today in a better state. The severe cellulitis on his left leg seems better with the Keflex. Not as tender. He has not been systemically unwell Hard to find an open wound on the left lower leg using his compression pumps twice a day The confluent wounds on his right lateral calf somewhat better looking. These will ultimately need debridement I didn't do this today. 06/01/18; the severe cellulitis on the left anterior thigh has resolved and he is completed his Keflex. There is no open wound on the left leg however there is a superficial excoriation at the base of the third toe dorsally. Skin on the bottom of his left foot is macerated looking. The left the wounds on the lateral right leg actually looks some better although he did require debridement of the top half of this wound area with an open curet 06/09/18 on evaluation today patient appears to be doing poorly in regard to his right lower extremity in particular this appears to likely be infected he has very thick purulent discharge along with a bright green tent to the discharge. This makes me concerned about the possibility of pseudomonas. He's also having increased discomfort at this point on evaluation. Fortunately there does not appear to be any evidence of infection spreading to the other location at this time. 06/16/18 on evaluation today patient appears to actually be doing fairly well. His ulcer has actually diminished in size quite significantly at this point which is good news. Nonetheless he still does have some evidence of infection he did see infectious disease this morning before coming here for his appointment. I did review the results of their evaluation and their note today. They did actually have him discontinue the Cipro and initiate treatment with linezolid at this time. He is doing this for the  next seven days and they recommended a follow-up in four months with them. He is the keep a log of the need for intermittent antibiotic therapy between now and when he falls back up with infectious disease. This will help them gaze what exactly they need to do to try and help them out. 06/23/18; the patient arrives today with no open wounds on the left leg and left third toe healed. He is been using his compression pumps twice a day. On the right lateral leg he still has a sizable wound but this is a lot better than last time I saw this. 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 Has been using silver alginate under 4 layer compression 06/30/18; the only open wound he has is on the right lateral leg and this looks healthy. No debridement is required. We have been using silver  alginate. He does not have an open wound on the left leg. There is apparently some drainage from the dorsal proximal third toe on the left although I see no open wound here. 07/03/18 on evaluation today patient was actually here just for a nurse visit rapid change. However when he was here on Wednesday for his rat change due to having been healed on the left and then developing blisters we initiated the wrap again knowing that he would be back today for [place] to reevaluate and see were at. Unfortunately he has developed some cellulitis into the proximal portion of his right lower extremity even into the region of his thigh. 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 He was placed on one as what at that point. Nonetheless he is done with that and has been tolerating it well otherwise. Doxycycline which in the past really did not seem to be effective for him. Nonetheless I think the best option may be for [place] to definitely reinitiate the antibiotics for a longer period of time. 07/07/18; since I  last saw this patient a week ago he has had a difficult time. At that point he did not have an open wound on his left leg. We transitioned him into juxta light stockings. He was apparently in the clinic the next day with blisters on the left lateral and left medial lower calf. He also had weeping edema fluid. He was put back into a compression wrap. He was also in the clinic on Friday with intense erythema in his right thigh. Per the patient he was started on Bactrim however that didn't work at all in terms of relieving his pain and swelling. He has taken 3 doxycycline that he had left over from last time and that seems to of helped. He has blistering on the right thigh as well. 07/14/18; the erythema on his right thigh has gotten better with doxycycline that he is finishing. 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 Presumably this cellulitis in the thigh was not related to that although I think this is a bit concerning going forward. He still has an area on the right lateral calf the blister on the right medial calf just below the knee that was discussed above. On the left 2 small open areas left medial and left lateral. Edema control is adequate. He is using his compression pumps twice a day 07/20/18; continued improvement in the condition of both legs especially the edema in his bilateral thighs. He tells me he is been losing weight through a combination of diet and exercise. He is using his compression pumps twice a day. So overall she made to the remaining wounds 07/27/2018; continued improvement in condition of both legs. His edema is well controlled. The area on the right lateral leg is just about closed he had one blisters show up on the medial left upper calf. We have him in 4 layer compression. He is going on a 10-day trip to [place], [place] and [place]. He will be driving. He wants to wear Unna boots because of  the lessening amount of constriction. He will not use compression pumps while he is away 08/05/18 on evaluation today patient actually appears to be doing decently well all things considered in regard to his bilateral lower extremities. The worst ulcer is actually only posterior aspect of his left lower  extremity with a four layer compression wrap cut into his leg a couple weeks back. He did have a trip and actually had [employer] for the trip that he is worn since he was last here. Nonetheless he feels like the [employer] actually do better for him his swelling is up a little bit but he also with his trip was not taking his Lasix on a regular set schedule like he was supposed to be. He states that obviously the reason being that he cannot drive and keep going without having to urinate too frequently which makes it difficult. He did not have his pumps with him while he was away either which I think also maybe playing a role here too. 08/13/2018; the patient only has a small open wound on the right lateral calf which is a big improvement in the last month or 2. He also has the area posteriorly just below the posterior fossa on the left which I think was a wrap injury from several weeks ago. He has no current evidence of cellulitis. He tells me he is back into his compression pumps twice a day. He also tells me that while he was at the laundromat somebody stole a section of his extremitease stockings 08/20/2018; back in the clinic with a much improved state. He only has small areas on the right lateral mid calf which is just about healed. This was is more substantial area for quite a prolonged period of time. He has a small open area on the left anterior tibia. The area on the posterior calf just below the popliteal fossa is closed today. He is using his compression pumps twice a day 08/28/2018; patient has no open wound on the right leg. He has a smattering of open areas on the calf with some  weeping lymphedema. More problematically than that it looks as though his wraps of slipped down in his usual he has very angry upper area of edema just below the right medial knee and on the right lateral calf. He has no open area on his feet. The patient is traveling to [name] next week. I will send him in an antibiotic. We will continue to wrap the right leg. We ordered extremitease stockings for him last week and I plan to transition the right leg to a stocking when he gets home which will be in 10 days time. As usual he is very reluctant to take his pumps with him when he travels 09/07/2018; patient returns from [facility]. He shows me a picture of his left leg in the mid part of his trip last week with intense fire engine erythema. The picture look bad enough I would have considered sending him to the hospital. Instead he went to the [name] in [facility]. They did not prescribe him antibiotics but he did take some doxycycline he had leftover from a previous visit. I had given him trimethoprim sulfamethoxazole before he left this did not work according to the patient. This is resulted in some improvement fortunately. He comes back with a large wound on the left posterior calf. Smaller area on the left anterior tibia. Denuded blisters on the dorsal left foot over his toes. Does not have much in the way of wounds on the right leg although he does have a very tender area on the right posterior area just below the popliteal fossa also suggestive of infection. He promises me he is back on his pumps twice a  day 09/15/2018; the intense cellulitis in his left lower calf is a lot better. The wound area on the posterior left calf is also so better. However he has reasonably extensive wounds on the dorsal aspect of his second and third toes and the proximal foot just at the base of the toes. There is nothing open on the right leg 09/22/2018; the patient has excellent edema  control in his legs bilaterally. He is using his external compression pumps twice a day. He has no open area on the right leg and only the areas in the left foot dorsally second and third toe area on the left side. He does not have any signs of active cellulitis. 10/06/2018; the patient has good edema control bilaterally. He has no open wound on the right leg. There is a blister in the posterior aspect of his left calf that we had to deal with today. He is using his compression pumps twice a day. There is no signs of active cellulitis. We have been using silver alginate to the wound areas. He still has vulnerable areas on the base of his left first second toes dorsally He has a his extremities stockings and we are going to transition him today into the stocking on the right leg. He is cautioned that he will need to continue to use the compression pumps twice a day. If he notices uncontrolled edema in the right leg he may need to go to 3 times a day. 10/13/2018; the patient came in for a nurse check on Friday he has a large flaccid blister on the right medial calf just below the knee. We unroofed this. He has this and a new area underneath the posterior mid calf which was undoubtedly a blister as well. He also has several small areas on the right which is the area we put his extremities stocking on. 10/19/2018; the patient went to see infectious disease this morning I am not sure if that was a routine follow-up in any case the doxycycline I had given him was discontinued and started on linezolid. He has not started this. It is easy to look at his left calf and the inflammation and think this is cellulitis however he is very tender in the tissue just below the popliteal fossa and I have no doubt that there is infection going on here. He states the problem he is having is that with the compression pumps the edema goes down and then starts walking the wrap falls down. We will see if we can adhere this. He  has 1 or 2 minuscule open areas on the right still areas that are weeping on the posterior left calf, the base of his left second and third toes 10/26/18; back today in clinic with quite of skin breakdown in his left anterior leg. This may have been infection the area below the popliteal fossa seems a lot better however tremendous epithelial loss on the left anterior mid tibia area over quite inexpensive tissue. He has 2 blisters on the right side but no other open wound here. 10/29/2018; came in urgently to see [place] today and we worked him in for review. He states that the 4 layer compression on the right leg caused pain he had to cut it down to roughly his mid calf this caused swelling above the wrap and he has blisters and skin breakdown today. As a result of the pain he has not been using his pumps. Both legs are a lot more edematous and there  is a lot of weeping fluid. 11/02/18; arrives in clinic with continued difficulties in the right leg> left. Leg is swollen and painful. multiple skin blisters and new open areas especially laterally. He has not been using his pumps on the right leg. 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 He is not systemically unwell. 11/09/2018; the patient claims he is being compliant with his pumps. He is finished the doxycycline I gave him last week. Culture I did of the wound on the right lateral leg showed a few very resistant methicillin staph aureus. This was resistant to doxycycline. Nevertheless he states the pain in the leg is a lot better which makes me wonder if the cultured organism was not really what was causing the problem nevertheless this is a very dangerous organism to be culturing out of any wound. His right leg is still a lot larger than the left. He is using an Radio broadcast assistant on this area, he blames a 4-layer compression for causing the original skin breakdown which I doubt is true however I cannot talk him out  of it. We have been using silver alginate to all of these areas which were initially blisters 11/16/2018; patient is being compliant with his external compression pumps at twice a day. Miraculously he arrives in clinic today with absolutely no open wounds. He has better edema control on the left where he has been using 4 layer compression versus wound of wounds on the right and I pointed this out to him. There is no inflammation in the skin in his lower legs which is also somewhat unusual for him. There is no open wounds on the dorsal left foot. He has extremitease stockings at home and I have asked him to bring these in next week. 11/25/18 patient's lower extremity on examination today on the left appears for the most part to be wound free. He does have an open wound on the lateral aspect of the right lower extremity but this is minimal compared to what I've seen in past. He does request that we go ahead and wrap the left leg as well even though there's nothing open just so hopefully it will not reopen in short order. 1/28; patient has superficial open wounds on the right lateral calf left anterior calf and left posterior calf. His edema control is adequate. 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 We have been using silver alginate as the primary dressing. He claims compliance with his compression pumps 2/4; patient has superficial open wounds on numerous areas of his left calf and again one on the left dorsal foot. The areas on the right lateral calf have healed. The cellulitis that I gave him doxycycline for last week is also resolved this was mostly on the left anterior calf just below the tibial tuberosity. His edema looks fairly well-controlled. He tells me he went to see his primary doctor today and had blood work ordered 2/11; once again he has several open areas on the left calf left tibial area. Most of  these are small and appear to have healthy granulation. He does not have anything open on the right. The edema and control in his thighs is pretty good which is usually a good indication he has been using his pumps as requested. 2/18; he continues to have several small areas on the left calf and left tibial area. Most of  these are small healthy granulation. We put him in his stocking on the right leg last week and he arrives with a superficial open area over the right upper tibia and a fairly large area on the right lateral tibia in similar condition. His edema control actually does not look too bad, he claims to be using his compression pumps twice a day 2/25. Continued small areas on the left calf and left tibial area. New areas especially on the right are identified just below the tibial tuberosity and on the right upper tibia itself. There are also areas of weeping edema fluid even without an obvious wound. He does not have a considerable degree of lymphedema but clearly there is more edema here than his skin can handle. He states he is using the pumps twice a day. We have an Unna boot on the right and 4 layer compression on the left. 3/3; he continues to have an area on the right lateral calf and right posterior calf just below the popliteal fossa. There is a fair amount of tenderness around the wound on the popliteal fossa but I did not see any evidence of cellulitis, could just be that the wrap came down and rubbed in this area. He does not have an open area on the left leg however there is an area on the left dorsal foot at the base of the third toe We have been using silver alginate to all wound areas 3/10; he did not have an open area on his left leg last time he was here a week ago. T oday he arrives with a horizontal wound just below the tibial tuberosity and an area on the left lateral calf. He has intense erythema and tenderness in this area. The area is on the right lateral calf and  right posterior calf better than last week. We have been using silver alginate as usual 3/18 - Patient returns with 3 small open areas on left calf, and 1 small open area on right calf, the skin looks ok with no significant erythema, he continues the UNA boot on right and 4 layer compression on left. The right lateral calf wound is closed , the right posterior is small area. we will continue silver alginate to the areas. 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 He is been tolerating the dressing changes without complication. Fortunately she has made excellent improvement in regard to the overall status of his wounds. Unfortunately every time we cease wrapping him he ends up reopening in causing more significant issues at that point. Again I'm unsure of the best direction to take although I think the lymphedema clinic may be appropriate for him. 02/03/19 on evaluation today patient appears to be doing well in regard to the wounds that we saw him for last week unfortunately he has a new area on the proximal portion of his right medial/posterior lower extremity where the wrap somewhat slowed down and caused swelling and a blister to rub and open. Unfortunately this is the only opening that he has on either leg at this point. 02/17/19 on evaluation today patient's bilateral lower extremities appear to be doing well. He still completely healed in regard to the left lower extremity. In regard to the right lower extremity the area where the wrap and slid down and caused the  blister still seems to be slightly open although this is dramatically better than during the last evaluation two weeks ago. I'm very pleased with the way this stands overall. 03/03/19 on evaluation today patient appears to be doing well in regard  to his right lower extremity in general although he did have a new blister open this does not appear to be showing any evidence of active infection at this time. Fortunately there's No fevers, chills, nausea, or vomiting noted at this time. Overall I feel like he is making good progress it does feel like that the right leg will we perform the [employer] seems to do with a bit better than three layer wrap on the left which slid down on him. We may switch to doing bilateral in the book wraps. 5/4; I have not seen Mr. Hotard in quite some time. According to our case manager he did not have an open wound on his left leg last week. He had 1 remaining wound on the right posterior medial calf. He arrives today with multiple openings on the left leg probably were blisters and/or wrap injuries from Unna boots. I do not think the Unna boot's will provide adequate compression on the left. I am also not clear about the frequency he is using the compression pumps. 03/17/19 on evaluation today patient appears to be doing excellent in regard to his lower extremities compared to last week's evaluation apparently. He had gotten significantly worse last week which is unfortunate. The [employer] wrap on the left did not seem to do very well for him at all and in fact it didn't control his swelling significantly enough he had an additional outbreak. Subsequently we go back to the four layer compression wrap on the left. This is good news. At least in that he is doing better and the wound seem to be killing him. He still has not heard anything from the lymphedema clinic. 03/24/19 on evaluation today patient actually appears to be doing much better in regard to his bilateral lower Trinity as compared to last week when I saw him. Fortunately there's no signs of active infection at this time. He has been tolerating the dressing changes without complication. Overall I'm extremely pleased with the progress and appearance in  general. 04/07/19 on evaluation today patient appears to be doing well in regard to his bilateral lower extremities. His swelling is significantly down from where it was previous. With that being said he does have a couple blisters still open at this point but fortunately nothing that seems to be too severe and again the majority of the larger openings has healed at this time. 04/14/19 on evaluation today patient actually appears to be doing quite well in regard to his bilateral lower extremities in fact I'm not even sure there's anything significantly open at this time at any site. Nonetheless he did have some trouble with these wraps where they are somewhat irritating him secondary to the fact that he has noted that the graph wasn't too close down to the end of this foot in a little bit short as well up to his knee. Otherwise things seem to be doing quite well. 04/21/19 upon evaluation today patient's wound bed actually showed evidence of being completely healed in regard to both lower extremities which is excellent news. There does not appear to be any signs of active infection which is also good news. I'm very pleased in this regard. No fevers, chills, nausea, or vomiting noted at this time.  04/28/19 on evaluation today patient appears to be doing a little bit worse in regard to both lower extremities on the left mainly due to the fact that when he went infection disease the wrap was not wrapped quite high enough he developed a blister above this. On the right he is a small open area of nothing too significant but again this is continuing to give him some trouble he has been were in the Velcro compression that he has at home. 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 He's been tolerating the dressing changes without complication. Fortunately there's no signs of active infection at this time. No fevers, chills, nausea, or vomiting noted at  this time. We have been trying to get an appointment with her lymphedema clinic in [name] but unfortunately nobody can get them on phone with not been able to even fax information over the patient likewise is not been able to get in touch with them. Overall I'm not sure exactly what's going on here with to reach out again today. 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 Still having a lot of drainage unfortunately. He tells me especially in the left but even on the right. There's no signs of active infection which is good news we've been using so ratcheted up to this point. 05/19/19 on evaluation today patient actually appears to be doing quite well with regard to his left lower extremity which is great news. Fortunately in regard to the right lower extremity has an issues with his wrap and he subsequently did remove this from what I'm understanding. Nonetheless long story short is what he had rewrapped once he removed it subsequently had maggots underneath this wrap whenever he came in for evaluation today. With that being said they were obviously completely cleaned away by the nursing staff. The visit today which is excellent news. However he does appear to potentially have some infection around the right ankle region where the maggots were located as well. He will likely require anabiotic therapy today. 05/26/19 on evaluation today patient actually appears to be doing much better in regard to his bilateral lower extremities. I feel like the infection is under much better control. With that being said there were maggots noted when the wrap was removed yet again today. Again this could have potentially been left over from previous although at this time there does not appear to be any signs of significant drainage there was obviously on the wrap some drainage as well this contracted gnats or  otherwise. Either way I do not see anything that appears to be doing worse in my pinion and in fact I think his drainage has slowed down quite significantly likely mainly due to the fact to his infection being under better control. 06/02/2019 on evaluation today patient actually appears to be doing well with regard to his bilateral lower extremities there is no signs of active infection at this time which is great news. With that being said he does have several open areas more so on the right than the left but nonetheless these are all significantly better than previously noted. 06/09/2019 on evaluation today patient actually appears to be doing well. His wrap stayed up and he did not cause any problems he had more drainage on the right compared to the left but overall I do not see any major issues  at this time which is great news. 06/16/2019 on evaluation today patient appears to be doing excellent with regard to his lower extremities the only area that is open is a new blister that can have opened as of today on the medial ankle on the left. Other than this he really seems to be doing great I see no major issues at this point. 06/23/2019 on evaluation today patient appears to be doing quite well with regard to his bilateral lower extremities. In fact he actually appears to be almost completely healed there is a small area of weeping noted of the right lower extremity just above the ankle. Nonetheless fortunately there is no signs of active infection at this time which is good news. No fevers, chills, nausea, vomiting, or diarrhea. 8/24; the patient arrived for a nurse visit today but complained of very significant pain in the left leg and therefore I was asked to look at this. Noted that he did not have an open area on the left leg last week nevertheless this was wrapped. The patient states that he is not been able to put his compression pumps on the left leg because of the discomfort. He has not been  systemically unwell 06/30/2019 on evaluation today patient unfortunately despite being excellent last week is doing much worse with regard to his left lower extremity today. In fact he had to come in for a nurse on Monday where his left leg had to be rewrapped due to excessive weeping Dr. Leanord Hawking placed him on doxycycline at that point. Fortunately there is no signs of active infection Systemically at this time which is good news. 07/07/2019 in regard to the patient's wounds today he actually seems to be doing well with his right lower extremity there really is nothing open or draining at this point this is great news. Unfortunately the left lower extremity is given him additional trouble at this time. There does not appear to be any signs of active infection nonetheless he does have a lot of edema and swelling noted at this point as well as blistering all of which has led to a much more poor appearing leg at this time compared to where it was 2 weeks ago when it was almost completely healed. Obviously this is a little discouraging for the patient. He is try to contact the lymphedema clinic in [place] he has not been able to get through to them. 07/14/2019 on evaluation today patient actually appears to be doing slightly better with regard to his left lower extremity ulcers. Overall I do feel like at least at the top of the wrap that we have been placing this area has healed quite nicely and looks much better. The remainder of the leg is showing signs of improvement. Unfortunately in the thigh area he still has an open region on the left and again on the right he has been utilizing just a Band-Aid on an area that also opened on the thigh. Again this is an area that were not able to wrap although we did do an Ace wrap to provide some compression that something that obviously is a little less effective than the compression wraps we have been using on the lower portion of the leg. He does have an  appointment with the lymphedema clinic in [facility] on Friday. 07/21/2019 on evaluation today patient appears to be doing better with regard to his lower extremity ulcers. He has been tolerating the dressing changes without complication. Fortunately there is no signs of  active infection at this time. No fevers, chills, nausea, vomiting, or diarrhea. I did receive the paperwork from the physical therapist at the lymphedema clinic in [place]. Subsequently I signed off on that this morning and sent that back to him for further progression with the treatment plan. 07/28/2019 on evaluation today patient appears to be doing very well with regard to his right lower extremity where I do not see any open wounds at this point. Fortunately he is feeling great as far as that is concerned as well. In regard to the left lower extremity he has been having issues with still several areas of weeping and edema although the upper leg is doing better his lower leg still I think is going require the compression wrap at this time. No fevers, chills, nausea, vomiting, or diarrhea. 08/04/2019 on evaluation today patient unfortunately is having new wounds on the right lower extremity. Again we have been using Unna boot wrap on that side. We switched him to using his juxta lite wrap at home. With that being said he tells me he has been using it although his legs extremely swollen and to be honest really does not appear that he has been. I cannot know that for sure however. Nonetheless he has multiple new wounds on the right lower extremity at this time. Obviously we will have to see about getting this rewrapped for him today. 08/11/2019 on evaluation today patient appears to be doing fairly well with regard to his wounds. He has been tolerating the dressing changes including the compression wraps without complication. He still has a lot of edema in his upper thigh regions bilaterally he is supposed to be seeing the  lymphedema clinic on the 15th of this month once his wraps arrive for the upper part of his legs. 08/18/2019 on evaluation today patient appears to be doing well with regard to his bilateral lower extremities at this point. He has been tolerating the dressing changes without complication. Fortunately there is no signs of active infection which is also good news. He does have a couple weeping areas on the first and second toe of the right foot he also has just a small area on the left foot upper leg and a small area on the left lower leg but overall he is doing quite well in my opinion. He is supposed to be getting his wraps shortly in fact tomorrow and then subsequently is seeing the lymphedema clinic next Wednesday on the 21st. Of note he is also leaving on the 25th to go on vacation for a week to the beach. For that reason and since there is some uncertainty about what there can be doing at lymphedema clinic next Wednesday I am get a make an appointment for next Friday here for [place] to see what we need to do for him prior to him leaving for vacation. 10/23; patient arrives in considerable pain predominantly in the upper posterior calf just distal to the popliteal fossa also in the wound anteriorly above the major wound. This is probably cellulitis and he has had this recurrently in the past. He has no open wound on the right side and he has had an Radio broadcast assistant in that area. Finally I note that he has an area on the left posterior calf which by enlarge is mostly epithelialized. This protrudes beyond the borders of the surrounding skin in the setting of dry scaly skin and lymphedema. The patient is leaving for [name] on Sunday. Per his longstanding pattern,  he will not take his compression pumps with him predominantly out of fear that they will be stolen. He therefore asked that we put a Unna boot back on the right leg. He will also contact the [facility] care [name] in [facility] to see if they can  change his dressing in the mid week. 11/3; patient returned from his vacation to [facility]. He was seen on 1 occasion at their wound care [name]. They did a 2 layer compression system as they did not have our 4-layer wrap. I am not completely certain what they put on the wounds. They did not change the Unna boot on the right. The patient is also seeing a lymphedema specialist physical therapist in [place]. It appears that he has some compression sleeve for his thighs which indeed look quite a bit better than I am used to seeing. He pumps over these with his external compression pumps. 11/10; the patient has a new wound on the right medial thigh otherwise there is no open areas on the right. He has an area on the left leg posteriorly anteriorly and medially and an area over the left second toe. We have been using silver alginate. He thinks the injury on his thigh is secondary to friction from the compression sleeve he has. 11/17; the patient has a new wound on the right medial thigh last week. He thinks this is because he did not have a underlying stocking for his thigh juxta lite apparatus. He now has this. The area is fairly large and somewhat angry but I do not think he has underlying cellulitis. He has a intact blister on the right anterior tibial area. Small wound on the right great toe dorsally Small area on the medial left calf. 11/30; the patient does not have any open areas on his right leg and we did not take his juxta lite stocking off. However he states that on Friday his compression wrap fell down lodging around his upper mid calf area. As usual this creates a lot of problems for him. He called urgently today to be seen for a nurse visit however the nurse visit turned into a provider visit because of extreme erythema and pain in the left anterior tibia extending laterally and posteriorly. The area that is problematic is extensive 10/06/2019 upon evaluation today patient actually  appears to be doing poorly in regard to his left lower extremity. He Dr. Leanord Hawking did place him on doxycycline this past Monday apparently due to the fact that he was doing much worse in regard to this left leg. Fortunately the doxycycline does seem to be helping. Unfortunately we are still having a very difficult time getting his edema under any type of control in order to anticipate discharge at some point. The only way were really able to control his lymphedema really is with compression wraps and that has only even seemingly temporary. He has been seeing a lymphedema clinic they are trying to help in this regard but still this has been somewhat frustrating in general for the patient. 10/13/19 on evaluation today patient appears to be doing excellent with regard to his right lower extremity as far as the wounds are concerned. His swelling is still quite extensive unfortunately. He is still having a lot of drainage from the thigh areas bilaterally which is unfortunate. He's been going to lymphedema clinic but again he still really does not have this edema under control as far as his lower extremities are concern. With regard to his left  lower extremity this seems to be improving and I do believe the doxycycline has been of benefit for him. He is about to complete the doxycycline. 10/20/2019 on evaluation today patient appears to be doing poorly in regard to his bilateral lower extremities. More in the right thigh he has a lot of irritation at this site unfortunately. In regard to the left lower extremity the wrap was not quite as high it appears and does seem to have caused him some trouble as well. Fortunately there is no evidence of systemic infection though he does have some blue-green drainage which has me concerned for the possibility of Pseudomonas. He tells me he is previously taking Cipro without complications and he really does not care for Levaquin however due to some of the side effects he  has. He is not allergic to any medications specifically antibiotics that were aware of. 10/27/2019 on evaluation today patient actually does appear to be for the most part doing better when compared to last week's evaluation. With that being said he still has multiple open wounds over the bilateral lower extremities. He actually forgot to start taking the Cipro and states that he still has the whole bottle. He does have several new blisters on left lower extremity today I think I would recommend he go ahead and take the Cipro based on what I am seeing at this point. 12/30-Patient comes at 1 week visit, 4 layer compression wraps on the left and Unna boot on the right, primary dressing Xtrasorb and silver alginate. Patient is taking his Cipro and has a few more days left probably 5-6, and the legs are doing better. He states he is using his compressions devices which I believe he has 11/10/2019 on evaluation today patient actually appears to be much better than last time I saw him 2 weeks ago. His wounds are significantly improved and overall I am very pleased in this regard. Fortunately there is no signs of active infection at this time. He is just a couple of days away from completing Cipro. Overall his edema is much better he has been using his lymphedema pumps which I think is also helping at this point. 11/17/2019 on evaluation today patient appears to be doing excellent in regard to his wounds in general. His legs are swollen but not nearly as much as they have been in the past. Fortunately he is tolerating the compression wraps without complication. No fevers, chills, nausea, vomiting, or diarrhea. He does have some erythema however in the distal portion of his right lower extremity specifically around the forefoot and toes there is a little bit of warmth here as well. 11/24/2019 on evaluation today patient appears to be doing well with regard to his right lower extremity I really do not see any open  wounds at this point. His left lower extremity does have several open areas and his right medial thigh also is open. Other than this however overall the patient seems to be making good progress and I am very pleased at this point. 12/01/2019 on evaluation today patient appears to be doing poorly at this point in regard to his left lower extremity has several new blisters despite the fact that we have him in compression wraps. In fact he had a 4-layer compression wrap, his upper thigh wrapped from lymphedema clinic, and a juxta light over top of the 4 layer compression wrap the lymphedema clinic applied and despite all this he still develop blisters underneath. Obviously this does have me concerned  about the fact that unfortunately despite what we are doing to try to get wounds healed he continues to have new areas arise I do not think he is ever good to be at the point where he can realistically just use wraps at home to keep things under control. Typically when we heal him it takes about 1-2 days before he is back in the clinic with severe breakdown and blistering of his lower extremities bilaterally. This is happened numerous times in the past. Unfortunately I think that we may need some help as far as overall fluid overload to kind of limit what we are seeing and get things under better control. 12/08/2019 on evaluation today patient presents for follow-up concerning his ongoing bilateral lower extremity edema. Unfortunately he is still having quite a bit of swelling the compression wraps are controlling this to some degree but he did see Dr. [name] his cardiologist I do have that available for review today as far as the appointment was concerned that was on 12/06/2019. Obviously that she has been 2 days ago. The patient states that he is only been taking the Lasix 80 mg 1 time a day he had told me previously he was taking this twice a day. Nonetheless Dr. [name] recommended this be up to 80 mg 2 times a  day for the patient as he did appear to be fluid overloaded. With that being said the patient states he did this yesterday and he was unable to go anywhere or do anything due to the fact that he was constantly having to urinate. Nonetheless I think that this is still good to be something that is important for him as far as trying to get his edema under control at all things that he is going to be able to just expect his wounds to get under control and things to be better without going through at least a period of time where he is trying to stabilize his fluid management in general and I think increasing the Lasix is likely the first step here. It was also mentioned the possibility that the patient may require metolazone. With that being said he wanted to have the patient take Lasix twice a day first and then reevaluating 2 months to see where things stand. 12/15/2019 upon evaluation today patient appears to be doing regard to his legs although his toes are showing some signs of weeping especially on the left at this point to some degree on the right. There does not appear to be any signs of active infection and overall I do feel like the compression wraps are doing well for him but he has not been able to take the Lasix at home and the increased dose that Dr. [name] recommended. He tells me that just not go to be feasible for him. Nonetheless I think in this case he should probably send a message to Dr. [name] in order to discuss options from the standpoint of possible admission to get the fluid off or otherwise going forward. 12/22/2019 upon evaluation today patient appears to be doing fairly well with regard to his lower extremities at this point. In fact he would be doing excellent if it was not for the fact that his right anterior thigh apparently had an allergic reaction to adhesive tape that he used. The wound itself that we have been monitoring actually appears to be healed. There is a lot of  irritation at this point. 12/29/2019 upon evaluation today patient appears to be doing well  in regard to his lower extremities. His left medial thigh is open and somewhat draining today but this is the only region that is open the right has done much better with the treatment utilizing the steroid cream that I prescribed for him last week. Overall I am pleased in that regard. Fortunately there is no signs of active infection at this time. No fevers, chills, nausea, vomiting, or diarrhea. 01/05/2020 upon evaluation today patient appears to be doing more poorly in regard to his right lower extremity at this point upon evaluation today. Unfortunately he continues to have issues in this regard and I think the biggest issue is controlling his edema. This obviously is not very well controlled at this point is been recommended that he use the Lasix twice a day but he has not been able to do that. Unfortunately I think this is leading to an issue where honestly he is not really able to effectively control his edema and therefore the wounds really are not doing significantly better. I do not think that he is going to be able to keep things under good control unless he is able to control his edema much better. I discussed this again in great detail with him today. 01/12/2020 good news is patient actually appears to be doing quite well today at this point. He does have an appointment with lymphedema clinic tomorrow. His legs appear healed and the toe on the left is almost completely healed. In general I am very pleased with how things stand at this point. 01/19/2020 upon evaluation today patient appears to actually be doing well in regard to his lower extremities there is nothing open at this point. Fortunately he has done extremely well more recently. Has been seeing lymphedema clinic as well. With that being said he has Velcro wraps for his lower legs as well as his upper legs. The only wound really is on his toe  which is the right great toe and this is barely anything even there. With all that being said I think it is good to be appropriate today to go ahead and switch him over to the Velcro compression wraps. 01/26/2020 upon evaluation today patient appears to be doing worse with regard to his lower extremities after last week switch him to Velcro compression wraps. Unfortunately he lasted less than 24 hours he did not have the sock portion of his Velcro wrap on the left leg and subsequently developed a blister underneath the Velcro portion. Obviously this is not good and not what we were looking for at this point. He states the lymphedema clinic did tell him to wear the wrap for 23 hours and take him off for 1 I am okay with that plan but again right now we got a get things back under control again he may have some cellulitis noted as well. 02/02/2020 upon evaluation today patient unfortunately appears to have several areas of blistering on his bilateral lower extremities today mainly on the feet. His legs do seem to be doing somewhat better which is good news. Fortunately there is no evidence of active infection at this time. No fevers, chills, nausea, vomiting, or diarrhea. 02/16/2020 upon evaluation today patient appears to be doing well at this time with regard to his legs. He has a couple weeping areas on his toes but for the most part everything is doing better and does appear to be sealed up on his legs which is excellent news. We can continue with wrapping him at this point  as he had every time we discontinue the wraps he just breaks out with new wounds. There is really no point in is going forward with this at this point. 03/08/2020 upon evaluation today patient actually appears to be doing quite well with regard to his lower extremity ulcers. He has just a very superficial and really almost nonexistent blister on the left lower extremity he has in general done very well with the compression wraps. With  that being said I do not see any signs of infection at this time which is good news. 03/29/2020 upon evaluation today patient appears to be doing well with regard to his wounds currently except for where he had several new areas that opened up due to some of the wrap slipping and causing him trouble. He states he did not realize they had slipped. Nonetheless he has a 1 area on the right and 3 new areas on the left. Fortunately there is no signs of active infection at this time which is great news. 04/05/2020 upon evaluation today patient actually appears to be doing quite well in general in regard to his legs currently. Fortunately there is no signs of active infection at this time. No fevers, chills, nausea, vomiting, or diarrhea. He tells me next week that he will actually be seen in the lymphedema clinic on Thursday at 10 AM I see him on Wednesday next week. 04/12/2020 upon evaluation today patient appears to be doing very well with regard to his lower extremities bilaterally. In fact he does not appear to have any open wounds at this point which is good news. Fortunately there is no signs of active infection at this time. No fevers, chills, nausea, vomiting, or diarrhea. 04/19/2020 upon evaluation today patient appears to be doing well with regard to his wounds currently on the bilateral lower extremities. There does not appear to be any signs of active infection at this time. Fortunately there is no evidence of systemic infection and overall very pleased at this point. Nonetheless after I held him out last week he literally had blisters the next morning already which swelled up with him being right back here in the clinic. Overall I think that he is just not can be able to be discharged with his legs the way they are he is much to volume overloaded as far as fluid is concerned and that was discussed with him today of also discussed this but should try the clinic nurse manager as well as Dr.  Leanord Hawkingobson. 04/26/2020 upon evaluation today patient appears to be doing better with regard to his wounds currently. He is making some progress and overall swelling is under good control with the compression wraps. Fortunately there is no evidence of active infection at this time. 05/10/2020 on evaluation today patient appears to be doing overall well in regard to his lower extremities bilaterally. He is Tolerating the compression wraps without complication and with what we are seeing currently I feel like that he is making excellent progress. There is no signs of active infection at this time. 05/24/2020 upon evaluation today patient appears to be doing well in regard to his legs. The swelling is actually quite a bit down compared to where it has been in the past. Fortunately there is no sign of active infection at this time which is also good news. With that being said he does have several wounds on his toes that have opened up at this point. 05/31/2020 upon evaluation today patient appears to be doing well  with regard to his legs bilaterally where he really has no significant fluid buildup at this point overall he seems to be doing quite well. Very pleased in this regard. With regard to his toes these also seem to be drying up which is excellent. We have continue to wrap him as every time we tried as a transition to the juxta light wraps things just do not seem to get any better. 06/07/2020 upon evaluation today patient appears to be doing well with regard to his right leg at this point. Unfortunately left leg has a lot of blistering he tells me the wrap started to slide down on him when he tried to put his other Velcro wrap over top of it to help keep things in order but nonetheless still had some issues. 06/14/2020 on evaluation today patient appears to be doing well with regard to his lower extremity ulcers and foot ulcers at this point. I feel like everything is actually showing signs of improvement which  is great news overall there is no signs of active infection at this time. No fevers, chills, nausea, vomiting, or diarrhea. 06/21/2020 on evaluation today patient actually appears to be doing okay in regard to his wounds in general. With that being said the biggest issue I see is on his right foot in particular the first and second toe seem to be doing a little worse due to the fact this is staying very wet. I think he is probably getting need to change out his dressings a couple times in between each week when we see him in regard to his toes in order to keep this drier based on the location and how this is proceeding. 06/28/2020 on evaluation today patient appears to be doing a little bit more poorly overall in regard to the appearance of the skin I am actually somewhat concerned about the possibility of him having a little bit of an infection here. We discussed the course of potentially giving him a doxycycline prescription which he is taken previously with good result. With that being said I do believe that this is potentially mild and at this point easily fixed. I just do not want anything to get any worse. 07/12/2020 upon evaluation today patient actually appears to be making some progress with regard to his legs which is great news there does not appear to be any evidence of active infection. Overall very pleased with where things stand. 07/26/2020 upon evaluation today patient appears to be doing well with regard to his leg ulcers and toe ulcers at this point. He has been tolerating the compression wraps without complication overall very pleased in this regard. 08/09/2020 upon evaluation today patient appears to be doing well with regard to his lower extremities bilaterally. Fortunately there is no signs of active infection overall I am pleased with where things stand. 08/23/2020 on evaluation today patient appears to be doing well with regard to his wound. He has been tolerating the dressing  changes without complication. Fortunately there is no signs of active infection at this time. Overall his legs seem to be doing quite well which is great news and I am very pleased in that regard. No fevers, chills, nausea, vomiting, or diarrhea. 09/13/2020 upon evaluation today patient appears to be doing okay in regard to his lower extremities. He does have a fairly large blister on the right leg which I did remove the blister tissue from today so we can get this to dry out other than that however he  seems to be doing quite well. There is no signs of active infection at this time. 09/27/2020 upon evaluation today patient appears to actually be doing some better in regard to his right leg. Fortunately signs of active infection at this time which is great news. No fevers, chills, nausea, vomiting, or diarrhea. 10/04/2020 upon evaluation today patient actually appears to be showing signs of improvement which is great news with regard to his leg ulcers. Fortunately there is no signs of active infection which is great news he is still taking the antibiotics currently. No fevers, chills, nausea, vomiting, or diarrhea. 10/18/2020 on evaluation today patient appears to be doing well with regard to his legs currently. He has been tolerating the dressing changes including the wraps without complication. Fortunately there is no signs of active infection at this time. No fevers, chills, nausea, vomiting, or diarrhea. 10/25/2020 upon evaluation today patient appears to be doing decently well in regard to his wounds currently. He has been tolerating the dressing changes without complication. Overall I feel like he is making good progress albeit slow. Again this is something we can have to continue to wrap for some time to come most likely. 11/08/2020 upon evaluation today patient appears to be doing well with regard to his wounds currently. He has been tolerating the dressing changes without complication is not  currently on any antibiotics and he does not appear to show any signs of infection. He does continue to have a lot of drainage on the right leg not too severe but nonetheless this is very scattered. On the left leg this is looking to be much improved overall. 11/15/2020 upon evaluation today patient appears to be doing better with regard to his legs bilaterally. Especially the right leg which was much more significant last week. There does not appear to be any signs of active infection which is great news. No fevers, chills, nausea, vomiting, or diarrhea. 11/23/2019 upon evaluation today patient appears to be doing poorly still in regard to his lower extremities bilaterally. Unfortunately his right leg in particular appears to be doing much more poorly there is no signs really of infection this is not warm to touch but he does have a lot of drainage and weeping unfortunately. With that reason I do believe that we may need to initiate some treatment here to try to help calm down some of the swelling of the right leg. I think switching to a 4-layer compression wrap would be beneficial here. The patient is in agreement with giving this a try. 11/29/2020 upon evaluation today patient appears to be doing well currently in regard to his leg ulcers. I feel like the right leg is doing better he still has a lot of drainage but we do see some improvement here. The 4-layer compression wrap I think was helpful. 12/06/2020 upon evaluation today patient appears to be doing well with regard to his legs. In fact they seem to be doing about the best I have seen up to this point. Fortunately there is no signs of active infection at this time. No fevers, chills, nausea, vomiting, or diarrhea. 12/20/2020 upon evaluation today patient appears to be doing well at this time in regard to his legs. He is not having any significant draining which is great news. Fortunately there is no signs of active infection at this time. No fevers,  chills, nausea, vomiting, or diarrhea. 01/17/2021 upon evaluation today [place] actually appears to be doing excellent in regard to his legs. He has a  few areas again that come and go as far as his toes are concerned but overall this is doing quite well. 01/31/2021 upon evaluation today patient appears to be doing well with regard to his legs. Fortunately there does not appear to be any signs of active infection which is great news. Overall he is still having significant edema despite the compression wraps basically the 4-layer compression wrap to just keep things under control there is really not much room for play. 4/13: Mr. Justen is a longstanding patient in our clinic and benefits greatly from weekly compression wraps. Today he has no complaints. He has been tolerating the wraps well. He states he is using the lymphedema pumps at home. 5/4; patient presents for follow-up of his chronic lymphedema/venous insufficiency ulcers. He comes weekly for compression wraps. He has no complaints today. He was unable to tolerate the Coflex 2 layer Last week so we will do the four press 4-layer compression. He has been using his lymphedema pumps daily. 5/18; patient presents for 2-week follow-up. He has no complaints or issues today. He has developed a new wound to the right foot on his fourth toe. He overall feels well and denies signs of infection. 6/1; patient presents for 2-week follow-up. He has no complaints or issues today. He denies signs of infection. 04/18/2021 upon evaluation today patient appears to be doing well with regard to his legs bilaterally. Family open wound is actually on the toe of his left foot everything else is completely closed which is great news. In general I am extremely pleased with where things stand at this point. The patient is also happy that things are doing so well. 05/02/2021 upon evaluation today patient's legs actually appear to be doing quite well today. Fortunately there  does not appear to be any signs of active infection which is great and overall I am extremely pleased with where he stands today. The patient does not appear to have any evidence of active infection at this time which is also great news. 05/09/2021 upon evaluation today patient appears to be doing a little bit more poorly in regard to his legs. Unfortunately he is having issues with some breakdown and a blood blister on the left leg this is due to I believe honestly to how it was wrapped last week. Fortunately there does not appear to be any signs of infection but nonetheless this is still a concern to be honest. No fevers, chills, nausea, vomiting, or diarrhea. 05/16/2021 upon evaluation today patient appears to be doing significantly better as compared to last week. I am very pleased with where things stand today. There does not appear to be any signs of infection which is great news and overall very pleased with where we stand. No fevers, chills, nausea, vomiting, or diarrhea. Electronic Signature(s) Signed: 05/16/2021 1:38:23 PM By: Lenda Kelp PA-C Entered By: Lenda Kelp on 05/16/2021 13:38:23 -------------------------------------------------------------------------------- Physical Exam Details Patient Name: Date of Service: [name], [name] 05/16/2021 10:30 A M Medical Record Number: [id number] Patient Account Number: [id number] Date of Birth/Sex: Treating RN: 10/03/1951 (70 y.o. [name] Primary Care Provider: Nicoletta Ba Other Clinician: Referring Provider: Treating Provider/Extender: Adele Dan in Treatment: 277 Constitutional Obese and well-hydrated in no acute distress. Respiratory normal breathing without difficulty. Psychiatric this patient is able to make decisions and demonstrates good insight into disease process. Alert and Oriented x 3. pleasant and cooperative. Notes Patient's wound bed showed signs of good granulation and  epithelization at this point. There does not appear to be any evidence of infection which is great news and overall I am extremely pleased with where we stand. The large blood blister noted last week is pretty much almost completely resolved which is excellent news as well. Electronic Signature(s) Signed: 05/16/2021 1:38:50 PM By: Lenda Kelp PA-C Entered By: Lenda Kelp on 05/16/2021 13:38:50 -------------------------------------------------------------------------------- Physician Orders Details Patient Name: Date of Service: [name], [name] 05/16/2021 10:30 A M Medical Record Number: [id number] Patient Account Number: [id number] Date of Birth/Sex: Treating RN: 03/03/1951 (70 y.o. [name] Primary Care Provider: Nicoletta Ba Other Clinician: Referring Provider: Treating Provider/Extender: Adele Dan in Treatment: [phone] Verbal / Phone Orders: No Diagnosis Coding ICD-10 Coding Code Description L97.521 Non-pressure chronic ulcer of other 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 of skin L97.829 Non-pressure chronic ulcer of other part of left lower leg with unspecified severity L97.519 Non-pressure chronic ulcer of other part of right foot with unspecified severity I87.333 Chronic venous hypertension (idiopathic) with ulcer and inflammation of bilateral lower extremity I89.0 Lymphedema, not elsewhere classified E11.622 Type 2 diabetes mellitus with other skin ulcer E11.40 Type 2 diabetes mellitus with diabetic neuropathy, unspecified Follow-up Appointments ppointment in 2 weeks. - with Leonard Schwartz Return A Nurse Visit: - Next Week: Dressing Change Bathing/ Shower/ Hygiene May shower with protection but do not get wound dressing(s) wet. Edema Control - Lymphedema / SCD / Other Bilateral Lower Extremities Lymphedema Pumps. Use Lymphedema pumps on leg(s) 2-3 times a day for 45-60 minutes. If wearing any wraps or hose, do not remove them. Continue  exercising as instructed. Elevate legs to the level of the heart or above for 30 minutes daily and/or when sitting, a frequency of: - throughout the day Avoid standing for long periods of time. Exercise regularly Non Wound Condition Bilateral Lower Extremities Other Non Wound Condition Orders/Instructions: - lotion to both legs, 4 layer compression wraps both legs, silver alginate to any weeping areas Wound Treatment Wound #193 - T Second oe Wound Laterality: Left Cleanser: Soap and Water 1 x Per Week/15 Days Discharge Instructions: May shower and wash wound with dial antibacterial soap and water prior to dressing change. Cleanser: Wound Cleanser 1 x Per Week/15 Days Discharge Instructions: Cleanse the wound with wound cleanser prior to applying a clean dressing using gauze sponges, not tissue or cotton balls. Prim Dressing: KerraCel Ag Gelling Fiber Dressing, 2x2 in (silver alginate) 1 x Per Week/15 Days ary Discharge Instructions: Apply silver alginate to wound bed as instructed Secondary Dressing: Woven Gauze Sponges 2x2 in 1 x Per Week/15 Days Discharge Instructions: Apply over primary dressing as directed. Secured With: Insurance underwriter, Sterile 2x75 (in/in) 1 x Per Week/15 Days Discharge Instructions: Secure with stretch gauze as directed. Wound #197 - Lower Leg Wound Laterality: Left, Anterior Cleanser: Soap and Water 1 x Per Week/30 Days Discharge Instructions: May shower and wash wound with dial antibacterial soap and water prior to dressing change. Cleanser: Wound Cleanser 1 x Per Week/30 Days Discharge Instructions: Cleanse the wound with wound cleanser prior to applying a clean dressing using gauze sponges, not tissue or cotton balls. Peri-Wound Care: Sween Lotion (Moisturizing lotion) 1 x Per Week/30 Days Discharge Instructions: Apply moisturizing lotion as directed Prim Dressing: KerraCel Ag Gelling Fiber Dressing, 4x5 in (silver alginate) 1 x Per Week/30  Days ary Discharge Instructions: Apply silver alginate to wound bed as instructed Secondary Dressing: Woven Gauze Sponge, Non-Sterile 4x4 in 1  x Per Week/30 Days Discharge Instructions: Apply over primary dressing as directed. Secondary Dressing: ABD Pad, 5x9 1 x Per Week/30 Days Discharge Instructions: Apply over primary dressing as directed. Compression Wrap: FourPress (4 layer compression wrap) 1 x Per Week/30 Days Discharge Instructions: Apply four layer compression as directed. May also use [place] CoFlex 2 layer compression system as alternative. Electronic Signature(s) Signed: 05/16/2021 5:19:59 PM By: Lenda Kelp PA-C Signed: 05/16/2021 6:05:01 PM By: Antonieta Iba Entered By: Antonieta Iba on 05/16/2021 11:18:50 -------------------------------------------------------------------------------- Problem List Details Patient Name: Date of Service: [name], [name] 05/16/2021 10:30 A M Medical Record Number: [id number] Patient Account Number: [id number] Date of Birth/Sex: Treating RN: 05-Jan-1951 (70 y.o. [name] Primary Care Provider: Nicoletta Ba Other Clinician: Referring Provider: Treating Provider/Extender: Adele Dan in Treatment: 277 Active Problems ICD-10 Encounter Code Description Active Date MDM Diagnosis L97.521 Non-pressure chronic ulcer of other 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 of 04/27/2018 No Yes skin L97.829 Non-pressure chronic ulcer of other part of left lower leg with unspecified 03/21/2021 No Yes severity L97.519 Non-pressure chronic ulcer of other part of right foot with unspecified severity 03/21/2021 No Yes I87.333 Chronic venous hypertension (idiopathic) with ulcer and inflammation of 01/22/2016 No Yes bilateral lower extremity I89.0 Lymphedema, not elsewhere classified 01/22/2016 No Yes E11.622 Type 2 diabetes mellitus with other skin ulcer 01/22/2016 No Yes E11.40 Type 2 diabetes mellitus with diabetic  neuropathy, unspecified 01/22/2016 No Yes Inactive Problems ICD-10 Code Description Active Date Inactive Date L03.115 Cellulitis of right lower limb 12/22/2017 12/22/2017 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 06/30/2018 06/30/2018 L97.511 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 of skin 06/30/2018 06/30/2018 Resolved Problems ICD-10 Code Description Active Date Resolved Date L97.211 Non-pressure chronic ulcer of right calf limited to breakdown of skin 06/30/2018 06/30/2018 L97.221 Non-pressure chronic ulcer of left calf limited to breakdown of skin 09/30/2016 09/30/2016 L03.116 Cellulitis of left lower limb 04/01/2017 04/01/2017 L97.211 Non-pressure chronic ulcer of right calf limited to breakdown of skin 06/30/2017 06/30/2017 Electronic Signature(s) Signed: 05/16/2021 5:19:59 PM By: Lenda Kelp PA-C Signed: 05/16/2021 6:05:01 PM By: Antonieta Iba Previous Signature: 05/16/2021 10:39:05 AM Version By: Lenda Kelp PA-C Entered By: Antonieta Iba on 05/16/2021 11:14:11 -------------------------------------------------------------------------------- Progress Note Details Patient Name: Date of Service: [name], Devynn J. 05/16/2021 10:30 A M Medical Record Number: [id number] Patient Account Number: [id number] Date of Birth/Sex: Treating RN: 14-Jul-1951 (70 y.o. [name] Primary Care Provider: Nicoletta Ba Other Clinician: Referring Provider: Treating Provider/Extender: Adele Dan in Treatment: [phone] Subjective Chief Complaint Information obtained from Patient patient is here for evaluation of venous/lymphedema ulcers History of Present Illness (HPI) Referred by PCP for consultation. Patient has long standing history of BLE venous stasis, no prior ulcerations. At beginning of month, developed cellulitis and weeping. Received IM Rocephin followed by Keflex and resolved. Wears  compression stocking, appr 6 months old. Not sure strength. No present drainage. 01/22/16 this is a patient who is a type II diabetic on insulin. 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 He tells me he religiously wears pressure stockings of uncertain strength. He was here with weeping edema about 8 months ago but did not have an open wound. Roughly a month ago he had a reopening on his bilateral legs. He is been using bandages and Neosporin. He does not complain of pain. 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 Last BUN/creatinine I have is from 11/20/15 at 13 and 1.0 respectively 01/29/16; patient arrives today having tolerated the Profore wrap. He brought in his stockings and these are 18 mmHg stockings he bought from [place]. The compression here is likely inadequate. He does not complain of pain or excessive drainage she has no systemic symptoms. The wound on the right looks improved as does the one on the left although one on the left is more substantial with still tissue at risk below the actual wound area on the bilateral posterior calf 02/05/16; patient arrives with poor edema control. He states that we did put a 4 layer compression on it last week. No weight appear 5 this. 02/12/16; the area on the posterior right Has healed. The left Has a substantial wound that has necrotic surface eschar that requires a debridement with a curette. 02/16/16;the patient called or a Nurse visit secondary to increased swelling. He had been in earlier in the week with his right leg healed. He was transitioned to is on pressure stocking on the right leg with the only open wound on the left, a substantial area on the left posterior calf. Note he has a history of severe lower extremity edema, he has a history of chronic atrial fibrillation but not heart failure per my notes  but I'll need to research this. He is not complaining of chest pain shortness of breath or orthopnea. The intake nurse noted blisters on the previously closed right leg 02/19/16; this is the patient's regular visit day. I see him on Friday with escalating edema new wounds on the right leg and clear signs of at least right ventricular heart failure. I increased his Lasix to 40 twice a day. He is returning currently in follow-up. States he is noticed a decrease in that the edema 02/26/16 patient's legs have much less edema. There is nothing really open on the right leg. The left leg has improved condition of the large superficial wound on the posterior left leg 03/04/16; edema control is very much better. The patient's right leg wounds have healed. On the left leg he continues to have severe venous inflammation on the posterior aspect of the left leg. There is no tenderness and I don't think any of this is cellulitis. 03/11/16; patient's right leg is married healed and he is in his own stocking. The patient's left leg has deteriorated somewhat. There is a lot of erythema around the wound on the posterior left leg. There is also a significant rim of erythema posteriorly just above where the wrap would've ended there is a new wound in this location and a lot of tenderness. Can't rule out cellulitis in this area. 03/15/16; patient's right leg remains healed and he is in his own stocking. The patient's left leg is much better than last review. His major wound on the posterior aspect of his left Is almost fully epithelialized. He has 3 small injuries from the wraps. Really. Erythema seems a lot better on antibiotics 03/18/16; right leg remains healed and he is in his own stocking. The patient's left leg is much better. The area on the posterior aspect of the left calf is fully epithelialized. His 3 small injuries which were wrap injuries on the left are improved only one seems still open his erythema has  resolved 03/25/16; patient's right leg remains healed and he is in his own stocking.  There is no open area today on the left leg posterior leg is completely closed up. His wrap injuries at the superior aspect of his leg are also resolved. He looks as though he has some irritation on the dorsal ankle but this is fully epithelialized without evidence of infection. 03/28/16; we discharged this patient on Monday. Transitioned him into his own stocking. There were problems almost immediately with uncontrolled swelling weeping edema multiple some of which have opened. He does not feel systemically unwell in particular no chest pain no shortness of breath and he does not feel 04/08/16; the edema is under better control with the Profore light wrap but he still has pitting edema. There is one large wound anteriorly 2 on the medial aspect of his left leg and 3 small areas on the superior posterior calf. Drainage is not excessive he is tolerating a Profore light well 04/15/16; put a Profore wrap on him last week. This is controlled is edema however he had a lot of pain on his left anterior foot most of his wounds are healed 04/22/16 once again the patient has denuded areas on the left anterior foot which he states are because his wrap slips up word. He saw his primary physician today is on Lasix 40 twice a day and states that he his weight is down 20 pounds over the last 3 months. 04/29/16: Much improved. left anterior foot much improved. He is now on Lasix 80 mg per day. Much improved edema control 05/06/16; I was hoping to be able to discharge him today however once again he has blisters at a low level of where the compression was placed last week mostly on his left lateral but also his left medial leg and a small area on the anterior part of the left foot. 05/09/16; apparently the patient went home after his appointment on 7/4 later in the evening developing pain in his upper medial thigh together with subjective fever  and chills although his temperature was not taken. The pain was so intense he felt he would probably have to call 911. However he then remembered that he had leftover doxycycline from a previous round of antibiotics and took these. By the next morning he felt a lot better. He called and spoke to one of our nurses and I approved doxycycline over the phone thinking that this was in relation to the wounds we had previously seen although they were definitely were not. The patient feels a lot better old fever no chills he is still working. Blood sugars are reasonably controlled 05/13/16; patient is back in for review of his cellulitis on his anterior medial upper thigh. He is taking doxycycline this is a lot better. Culture I did of the nodular area on the dorsal aspect of his foot grew MRSA this also looks a lot better. 05/20/16; the patient is cellulitis on the medial upper thigh has resolved. All of his wound areas including the left anterior foot, areas on the medial aspect of the left calf and the lateral aspect of the calf at all resolved. He has a new blister on the left dorsal foot at the level of the fourth toe this was excised. No evidence of infection 05/27/16; patient continues to complain weeping edema. He has new blisterlike wounds on the left anterior lateral and posterior lateral calf at the top of his wrap levels. The area on his left anterior foot appears better. He is not complaining of fever, pain or pruritus in his feet. 05/30/16;  the patient's blisters on his left anterior leg posterior calf all look improved. He did not increase the Lasix 100 mg as I suggested because he was going to run out of his 40 mg tablets. He is still having weeping edema of his toes 06/03/16; I renewed his Lasix at 80 mg once a day as he was about to run out when I last saw him. He is on 80 mg of Lasix now. I have asked him to cut down on the excessive amount of water he was drinking and asked him to drink  according to his thirst mechanisms 06/12/2016 -- was seen 2 days ago and was supposed to wear his compression stockings at home but he is developed lymphedema and superficial blisters on the left lower extremity and hence came in for a review 06/24/16; the remaining wound is on his left anterior leg. He still has edema coming from between his toes. There is lymphedema here however his edema is generally better than when I last saw this. He has a history of atrial fibrillation but does not have a known history of congestive heart failure nevertheless I think he probably has this at least on a diastolic basis. 07/01/16 I reviewed his echocardiogram from January 2017. This was essentially normal. He did not have LVH, EF of 55-60%. His right ventricular function was normal although he did have trivial tricuspid and pulmonic regurgitation. This is not audible on exam however. I increased his Lasix to do massive edema in his legs well above his knees I think in early July. He was also drinking an excessive amount of water at the time. 07/15/16; missed his appointment last week because of the Labor Day holiday on Monday. He could not get another appointment later in the week. Started to feel the wrap digging in superiorly so we remove the top half and the bottom half of his wrap. He has extensive erythema and blistering superiorly in the left leg. Very tender. Very swollen. Edema in his foot with leaking edema fluid. He has not been systemically unwell 07/22/16; the area on the left leg laterally required some debridement. The medial wounds look more stable. His wrap injury wounds appear to have healed. Edema and his foot is better, weeping edema is also better. He tells me he is meeting with the supplier of the external compression pumps at work 08/05/16; the patient was on vacation last week in [name]. His wrap is been on for an extended period of time. Also over the weekend he developed an extensive area  of tender erythema across his anterior medial thigh. He took to doxycycline yesterday that he had leftover from a previous prescription. The patient complains of weeping edema coming out of his toes 08/08/16; I saw this patient on 10/2. He was tender across his anterior thigh. I put him on doxycycline. He returns today in follow-up. He does not have any open wounds on his lower leg, he still has edema weeping into his toes. 08/12/16; patient was seen back urgently today to follow-up for his extensive left thigh cellulitis/erysipelas. He comes back with a lot less swelling and erythema pain is much better. I believe I gave him Augmentin and Cipro. His wrap was cut down as he stated a roll down his legs. He developed blistering above the level of the wrap that remained. He has 2 open blisters and 1 intact. 08/19/16; patient is been doing his primary doctor who is increased his Lasix from 40-80 once a day  or 80 already has less edema. Cellulitis has remained improved in the left thigh. 2 open areas on the posterior left calf 08/26/16; he returns today having new open blisters on the anterior part of his left leg. He has his compression pumps but is not yet been shown how to use some vital representative from the supplier. 09/02/16 patient returns today with no open wounds on the left leg. Some maceration in his plantar toes 09/10/2016 -- Dr. Leanord Hawking had recently discharged him on 09/02/2016 and he has come right back with redness swelling and some open ulcers on his left lower extremity. He says this was caused by trying to apply his compression stockings and he's been unable to use this and has not been able to use his lymphedema pumps. He had some doxycycline leftover and he has started on this a few days ago. 09/16/16; there are no open wounds on his leg on the left and no evidence of cellulitis. 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 He does  not complain of symptoms here. I am not clear use using his external compression pumps. 09/23/16; I have not seen this patient in 2 weeks. He canceled his appointment 10 days ago as he was going on vacation. He tells me that on Monday he noticed a large area on his posterior left leg which is been draining copiously and is reopened into a large wound. He is been using ABDs and the external part of his juxtalite, according to our nurse this was not on properly. 10/07/16; Still a substantial area on the posterior left leg. Using silver alginate 10/14/16; in general better although there is still open area which looks healthy. Still using silver alginate. He reminds me that this happen before he left for [facility]. T oday while he was showering in the morning. He had been using his juxtalite's 10/21/16; the area on his posterior left leg is fully epithelialized. However he arrives today with a large area of tender erythema in his medial and posterior left thigh just above the knee. I have marked the area. Once again he is reluctant to consider hospitalization. I treated him with oral antibiotics in the past for a similar situation with resolution I think with doxycycline however this area it seems more extensive to me. He is not complaining of fever but does have chills and says states he is thirsty. His blood sugar today was in the 140s at home 10/25/16 the area on his posterior left leg is fully epithelialized although there is still some weeping edema. The large area of tenderness and erythema in his medial and posterior left thigh is a lot less tender although there is still a lot of swelling in this thigh. He states he feels a lot better. He is on doxycycline and Augmentin that I started last week. This will continued until Tuesday, December 26. I have ordered a duplex ultrasound of the left thigh rule out DVT whether there is an abscess something that would need to be drained I would also like to  know. 11/01/16; he still has weeping edema from a not fully epithelialized area on his left posterior calf. Most of the rest of this looks a lot better. He has completed his antibiotics. His thigh is a lot better. Duplex ultrasound did not show a DVT in the thigh 11/08/16; he comes in today with more Denuded surface epithelium from the posterior aspect of his calf. There is  no real evidence of cellulitis. The superior aspect of his wrap appears to have put quite an indentation in his leg just below the knee and this may have contributed. He does not complain of pain or fever. We have been using silver alginate as the primary dressing. The area of cellulitis in the right thigh has totally resolved. He has been using his compression stockings once a week 11/15/16; the patient arrives today with more loss of epithelium from the posterior aspect of his left calf. He now has a fairly substantial wound in this area. The reason behind this deterioration isn't exactly clear although his edema is not well controlled. He states he feels he is generally more swollen systemically. He is not complaining of chest pain shortness of breath fever. T me he has an appointment with his primary physician in early February. He is on 80 mg of oral ells Lasix a day. He claims compliance with the external compression pumps. He is not having any pain in his legs similar to what he has with his recurrent cellulitis 11/22/16; the patient arrives a follow-up of his large area on his left lateral calf. This looks somewhat better today. He came in earlier in the week for a dressing change since I saw him a week ago. He is not complaining of any pain no shortness of breath no chest pain 11/28/16; the patient arrives for follow-up of his large area on the left lateral calf this does not look better. In fact it is larger weeping edema. The surface of the wound does not look too bad. We have been using silver alginate although I'm not  certain that this is a dressing issue. 12/05/16; again the patient follows up for a large wound on the left lateral and left posterior calf this does not look better. There continues to be weeping edema necrotic surface tissue. More worrisome than this once again there is erythema below the wound involving the distal Achilles and heel suggestive of cellulitis. He is on his feet working most of the day of this is not going well. We are changing his dressing twice a week to facilitate the drainage. 12/12/16; not much change in the overall dimensions of the large area on the left posterior calf. This is very inflamed looking. I gave him an. Doxycycline last week does not really seem to have helped. He found the wrap very painful indeed it seems to of dog into his legs superiorly and perhaps around the heel. He came in early today because the drainage had soaked through his dressings. 12/19/16- patient arrives for follow-up evaluation of his left lower extremity ulcers. He states that he is using his lymphedema pumps once daily when there is "no drainage". He admits to not using his lipedema pumps while under current treatment. His blood sugars have been consistently between 150-200. 12/26/16; the patient is not using his compression pumps at home because of the wetness on his feet. I've advised him that I think it's important for him to use this daily. He finds his feet too wet, he can put a plastic bag over his legs while he is in the pumps. Otherwise I think will be in a vicious circle. We are using silver alginate to the major area on his left posterior calf 01/02/17; the patient's posterior left leg has further of all into 3 open wounds. All of them covered with a necrotic surface. He claims to be using his compression pumps once a day. His edema control  is marginal. Continue with silver alginate 01/10/17; the patient's left posterior leg actually looks somewhat better. There is less edema, less erythema. Still  has 3 open areas covered with a necrotic surface requiring debridement. He claims to be using his compression pumps once a day his edema control is better 01/17/17; the patient's left posterior calf look better last week when I saw him and his wrap was changed 2 days ago. He has noted increasing pain in the left heel and arrives today with much larger wounds extensive erythema extending down into the entire heel area especially tender medially. He is not systemically unwell CBGs have been controlled no fever. Our intake nurse showed me limegreen drainage on his AVD pads. 01/24/17; his usual this patient responds nicely to antibiotics last week giving him Levaquin for presumed Pseudomonas. The whole entire posterior part of his leg is much better much less inflamed and in the case of his Achilles heel area much less tender. He has also had some epithelialization posteriorly there are still open areas here and still draining but overall considerably better 01/31/17- He has continue to tolerate the compression wraps. he states that he continues to use the lymphedema pumps daily, and can increase to twice daily on the weekends. He is voicing no complaints or concerns regarding his LLE ulcers 02/07/17-he is here for follow-up evaluation. He states that he noted some erythema to the left medial and anterior thigh, which he states is new as of yesterday. He is concerned about recurrent cellulitis. He states his blood sugars have been slightly elevated, this morning in the 180s 02/14/17; he is here for follow-up evaluation. When he was last here there was erythema superiorly from his posterior wound in his anterior thigh. He was prescribed Levaquin however a culture of the wound surface grew MRSA over the phone I changed him to doxycycline on Monday and things seem to be a lot better. 02/24/17; patient missed his appointment on Friday therefore we changed his nurse visit into a physician visit today. Still using  silver alginate on the large area of the posterior left thigh. He isn't new area on the dorsal left second toe 03/03/17; actually better today although he admits he has not used his external compression pumps in the last 2 days or so because of work responsibilities over the weekend. 03/10/17; continued improvement. External compression pumps once a day almost all of his wounds have closed on the posterior left calf. Better edema control 03/17/17; in general improved. He still has 3 small open areas on the lateral aspect of his left leg however most of the area on the posterior part of his leg is epithelialized. He has better edema control. He has an ABD pad under his stocking on the right anterior lower leg although he did not let us look at that today. 03/24/17; patient arrives back in clinic today with no open areas however there are areas on the posterior left calf and anterior left calf that are less than 100% epithelialized. His edema is well controlled in the left lower leg. There is some pitting edema probably lymphedema in the left upper thigh. He uses compression pumps at home once per day. I tried to get him to do this twice a day although he is very reticent. 04/01/2017 -- for the last 2 days he's had significant redness, tenderness and weeping and came in for an urgent visit today. 04/07/17; patient still has 6 more days of doxycycline. He was seen by Dr. Meyer Russel  last Wednesday for cellulitis involving the posterior aspect, lateral aspect of his Involving his heel. For the most part he is better there is less erythema and less weeping. He has been on his feet for 12 hours o2 over the weekend. Using his compression pumps once a day 04/14/17 arrives today with continued improvement. Only one area on the posterior left calf that is not fully epithelialized. He has intense bilateral venous inflammation associated with his chronic venous insufficiency disease and secondary lymphedema. We have been  using silver alginate to the left posterior calf wound In passing he tells [place] today that the right leg but we have not seen in quite some time has an open area on it but he doesn't want [place] to look at this today states he will show this to [place] next week. 04/21/17; there is no open area on his left leg although he still reports some weeping edema. He showed [place] his right leg today which is the first time we've seen this leg in a long time. He has a large area of open wound on the right leg anteriorly healthy granulation. Quite a bit of swelling in the right leg and some degree of venous inflammation. He told us about the right leg in passing last week but states that deterioration in the right leg really only happened over the weekend 04/28/17; there is no open area on the left leg although there is an irritated part on the posterior which is like a wrap injury. The wound on the right leg which was new from last week at least to [place] is a lot better. 05/05/17; still no open area on the left leg. Patient is using his new compression stocking which seems to be doing a good job of controlling the edema. He states he is using his compression pumps once per day. The right leg still has an open wound although it is better in terms of surface area. Required debridement. A lot of pain in the posterior right Achilles marked tenderness. Usually this type of presentation this patient gives concern for an active cellulitis 05/12/17; patient arrives today with his major wound from last week on the right lateral leg somewhat better. Still requiring debridement. He was using his compression stocking on the left leg however that is reopened with superficial wounds anteriorly he did not have an open wound on this leg previously. He is still using his juxta light's once daily at night. He cannot find the time to do this in the morning as he has to be at work by 7 AM 05/19/17; right lateral leg wound looks improved. No debridement  required. The concerning area is on the left posterior leg which appears to almost have a subcutaneous hemorrhagic component to it. We've been using silver alginate to all the wounds 05/26/17; the right lateral leg wound continues to look improved. However the area on the left posterior calf is a tightly adherent surface. [place] using silver alginate. Because of the weeping edema in his legs there is very little good alternatives. 06/02/17; the patient left here last week looking quite good. Major wound on the left posterior calf and a small one on the right lateral calf. Both of these look satisfactory. He tells me that by Wednesday he had noted increased pain in the left leg and drainage. He called on Thursday and Friday to get an appointment here but we were blocked. He did not go to urgent care or his primary physician. He thinks he  had a fever on Thursday but did not actually take his temperature. He has not been using his compression pumps on the left leg because of pain. I advised him to go to the emergency room today for IV antibiotics for stents of left leg cellulitis but he has refused I have asked him to take 2 days off work to keep his leg elevated and he has refused this as well. In view of this I'm going to call him and Augmentin and doxycycline. 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 However after a detailed discussion regarding the risk benefits and alternatives he refuses to get admitted to the hospital. With no other recourse I will continue him on oral antibiotics as before and hopefully he'll have his infectious disease consultation this week. 06/16/2017 -- the patient was seen today by the nurse practitioner at infectious disease Ms. Dixon.  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 He has also been advise Lotrimin cream locally. she also had wise IV antibiotics for his condition if it flares up 06/23/17; patient arrives today with drainage bilaterally although the remaining wound on the left posterior calf after cleaning up today "highlighter yellow drainage" did not look too bad. Unfortunately he has had breakdown on the right anterior leg [previously this leg had not been open and he is using a black stocking] he went to see infectious disease and is been put on clindamycin 150 mg daily, I did not verify the dose although I'm not familiar with using clindamycin in this dosing range, perhaps for prophylaxisoo 06/27/17; I brought this patient back today to follow-up on the wound deterioration on the right lower leg together with surrounding cellulitis. I started him on doxycycline 4 days ago. This area looks better however he comes in today with intense cellulitis on the medial part of his left thigh. This is not have a wound in this area. Extremely tender. We've been using silver alginate to the wounds on the right lower leg left lower leg with bilateral 4 layer compression he is using his external compression pumps once a day 07/04/17; patient's left medial thigh cellulitis looks better. He has not been using his compression pumps as his insert said it was contraindicated with cellulitis. His right leg continues to make improvements all the wounds are still open. We only have one remaining wound on the left posterior calf. Using silver alginate to all open areas. He is on doxycycline which I started a week ago and should be finishing I gave him Augmentin after Thursday's visit for the severe cellulitis on the left medial thigh which fortunately looks  better 07/14/17; the patient's left medial thigh cellulitis has resolved. The cellulitis in his right lower calf on the right also looks better. All of his wounds are stable to improved we've been using silver alginate he has completed the antibiotics I have given him. He has clindamycin 150 mg once a day prescribed by infectious disease for prophylaxis, I've advised him to start this now. We have been using bilateral Unna boots over silver alginate  to the wound areas 07/21/17; the patient is been to see infectious disease who noted his recurrent problems with cellulitis. He was not able to tolerate prophylactic clindamycin therefore he is on amoxicillin 500 twice a day. He also had a second daily dose of Lasix added By Dr. Oneta Rack but he is not taking this. Nor is he being completely compliant with his compression pumps a especially not this week. He has 2 remaining wounds one on the right posterior lateral lower leg and one on the left posterior medial lower leg. 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 The patient has Unna boots bilaterally. 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 He has bilateral Unna boots for compression and silver alginate to his wounds. Arrives today with his legs looking as good as I have seen him in quite some time. Not surprisingly his wounds look better as well with improvement on the right lateral leg venous insufficiency wound and also the left medial leg. He is still using the compression pumps once a day 08/11/17; both legs appear to be doing better wounds on the right lateral and left medial legs look better. Skin on the right leg quite good. He is been using silver alginate as the primary dressing. I'm going to use Anasept gel calcium alginate  and maintain all the secondary dressings 08/18/17; the patient continues to actually do quite well. The area on his right lateral leg is just about closed the left medial also looks better although it is still moist in this area. His edema is well controlled we have been using Anasept gel with calcium alginate and the usual secondary dressings, 4 layer compression and once daily use of his compression pumps "always been able to manage 09/01/17; the patient continues to do reasonably well in spite of his trip to [place]. The area on the right lateral leg is epithelialized. Left is much better but still open. He has more edema and more chronic erythema on the left leg [venous inflammation] 09/08/17; he arrives today with no open wound on the right lateral leg and decently controlled edema. Unfortunately his left leg is not nearly as in his good situation as last week.he apparently had increasing edema starting on Saturday. He edema soaked through into his foot so used a plastic bag to walk around his home. The area on the medial right leg which was his open area is about the same however he has lost surface epithelium on the left lateral which is new and he has significant pain in the Achilles area of the left foot. He is already on amoxicillin chronically for prophylaxis of cellulitis in the left leg 09/15/17; he is completed a week of doxycycline and the cellulitis in the left posterior leg and Achilles area is as usual improved. He still has a lot of edema and fluid soaking through his dressings. There is no open wound on the right leg. He saw infectious disease NP today 09/22/17;As usual 1 we transition him from our compression wraps to his stockings things did not go well. He has several small open areas on the right leg. He states this was caused by the compression wrap on his skin although he did not wear this with the stockings over them. He has several superficial areas on  the left leg  medially laterally posteriorly. He does not have any evidence of active cellulitis especially involving the left Achilles The patient is traveling from [facility] Saturday going to [name]. He states he isn't attempting to get an appointment with a heel objects wound [name] there to change his dressings. I am not completely certain whether this will work 10/06/17; the patient came in on Friday for a nurse visit and the nurse reported that his legs actually look quite good. He arrives in clinic today for his regular follow-up visit. He has a new wound on his left third toe over the PIP probably caused by friction with his footwear. He has small areas on the left leg and a very superficial but epithelialized area on the right anterior lateral lower leg. Other than that his legs look as good as I've seen him in quite some time. We have been using silver alginate Review of systems; no chest pain no shortness of breath other than this a 10 point review of systems negative 10/20/17; seen by Dr. Meyer Russel last week. He had taken some antibiotics [doxycycline] that he had left over. Dr. Meyer Russel thought he had candida infection and declined to give him further antibiotics. He has a small wound remaining on the right lateral leg several areas on the left leg including a larger area on the left posterior several left medial and anterior and a small wound on the left lateral. The area on the left dorsal third toe looks a lot better. ROS; Gen.; no fever, respiratory no cough no sputum Cardiac no chest pain other than this 10 point review of system is negative 10/30/17; patient arrives today having fallen in the bathtub 3 days ago. It took him a while to get up. He has pain and maceration in the wounds on his left leg which have deteriorated. He has not been using his pumps he also has some maceration on the right lateral leg. 11/03/17; patient continues to have weeping edema especially in the left leg. This  saturates his dressings which were just put on on 12/27. As usual the doxycycline seems to take care of the cellulitis on his lower leg. He is not complaining of fever, chills, or other systemic symptoms. He states his leg feels a lot better on the doxycycline I gave him empirically. He also apparently gets injections at his primary doctor's officeo Rocephin for cellulitis prophylaxis. I didn't ask him about his compression pump compliance today I think that's probably marginal. Arrives in the clinic with all of his dressings primary and secondary macerated full of fluid and he has bilateral edema 11/10/17; the patient's right leg looks some better although there is still a cluster of wounds on the right lateral. The left leg is inflamed with almost circumferential skin loss medially to laterally although we are still maintaining anteriorly. He does not have overt cellulitis there is a lot of drainage. He is not using compression pumps. We have been using silver alginate to the wound areas, there are not a lot of options here 11/17/17; the patient's right leg continues to be stable although there is still open wounds, better than last week. The inflammation in the left leg is better. Still loss of surface layer epithelium especially posteriorly. There is no overt cellulitis in the amount of edema and his left leg is really quite good, tells me he is using his compression pumps once a day. 11/24/17; patient's right leg has a small superficial wound laterally this continues  to improve. The inflammation in the left leg is still improving however we have continuous surface layer epithelial loss posteriorly. There is no overt cellulitis in the amount of edema in both legs is really quite good. He states he is using his compression pumps on the left leg once a day for 5 out of 7 days 12/01/17; very small superficial areas on the right lateral leg continue to improve. Edema control in both legs is better today. He  has continued loss of surface epithelialization and left posterior calf although I think this is better. We have been using silver alginate with large number of absorptive secondary dressings 4 layer on the left Unna boot on the right at his request. He tells me he is using his compression pumps once a day 12/08/17; he has no open area on the right leg is edema control is good here. ooOn the left leg however he has marked erythema and tenderness breakdown of skin. He has what appears to be a wrap injury just distal to the popliteal fossa. 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 There was swelling erythema and breakdown in the right leg. He has much worse skin breakdown in the left leg as well multiple open areas medially and posteriorly as well as laterally. He tells me he has been using his compression pumps but tells me he feels that the drainage out of his leg is worse when he uses a compression pumps. T be fair to him he is been saying this o for a while however I don't know that I have really been listening to this. I wonder if the compression pumps are working properly 12/22/17;. Once again he arrives with severe erythema, weeping edema from the left greater than right leg. Noncompliance with compression pumps. New this visit he is complaining of pain on the lateral aspect of the right leg and the medial aspect of his right thigh. He apparently saw his cardiologist Dr. [name] who was ordered an echocardiogram area and I think this is a step in the right direction 12/25/17; started his doxycycline Monday night. There is still intense erythema of the right leg especially in the anterior thigh although there is less  tenderness. The erythema around the wound on the right lateral calf also is less tender. He still complaining of pain in the left heel. His wounds are about the same right lateral left medial left lateral. Superficial but certainly not close to closure. He denies being systemically unwell no fever chills no abdominal pain no diarrhea 12/29/17; back in follow-up of his extensive right calf and right thigh cellulitis. I added amoxicillin to cover possible doxycycline resistant strep. This seems to of done the trick he is in much less pain there is much less erythema and swelling. He has his echocardiogram at 11:00 this morning. X-ray of the left heel was also negative. 01/05/18; the patient arrived with his edema under much better control. Now that he is retired he is able to use his compression pumps daily and sometimes twice a day per the patient. He has a wound on the right leg the lateral wound looks better. Area on the left leg also looks a lot better. He has no evidence of  cellulitis in his bilateral thighs I had a quick peak at his echocardiogram. He is in normal ejection fraction and normal left ventricular function. He has moderate pulmonary hypertension moderately reduced right ventricular function. One would have to wonder about chronic sleep apnea although he says he doesn't snore. He'll review the echocardiogram with his cardiologist. 01/12/18; the patient arrives with the edema in both legs under exemplary control. He is using his compression pumps daily and sometimes twice daily. His wound on the right lateral leg is just about closed. He still has some weeping areas on the posterior left calf and lateral left calf although everything is just about closed here as well. I have spoken with Aldean Baker who is the patient's nurse practitioner and infectious disease. She was concerned that the patient had not understood that the parenteral penicillin injections he was receiving for  cellulitis prophylaxis was actually benefiting him. I don't think the patient actually saw that I would tend to agree we were certainly dealing with less infections although he had a serious one last month. 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 He is healed. He'll be placed in juxtalite compression wraps and increase his lymphedema pumps to twice daily. We will follow up again next week to ensure there are no issues with the new regiment. 01/20/18-he is here for evaluation of bilateral lower extremity weeping edema. Yesterday he was placed in compression wrap to the right lower extremity and compression stocking to left lower shrubbery. He states he uses lymphedema pumps last night and again this morning and noted a blister to the left lower extremity. On exam he was noted to have drainage to the right lower extremity. He will be placed in Unna boots bilaterally and follow-up next week 01/26/18; patient was actually discharged a week ago to his own juxta light stockings only to return the next day with bilateral lower extremity weeping edema.he was placed in bilateral Unna boots. He arrives today with pain in the back of his left leg. There is no open area on the right leg however there is a linear/wrap injury on the left leg and weeping edema on the left leg posteriorly. I spoke with infectious disease about 10 days ago. They were disappointed that the patient elected to discontinue prophylactic intramuscular penicillin shots as they felt it was particularly beneficial in reducing the frequency of his cellulitis. I discussed this with the patient today. He does not share this view. He'll definitely need antibiotics today. Finally he is traveling to [place] and trauma leaving this Saturday and returning a week later and he does not travel with his pumps. He is going by car 01/30/18; patient was seen 4 days ago and brought back in today for review of cellulitis in  the left leg posteriorly. I put him on amoxicillin this really hasn't helped as much as I might like. He is also worried because he is traveling to [name] by car. Finally we will be rewrapping him. There is no open area on the right leg over his left leg has multiple weeping areas as usual 02/09/18; The same wrap on for 10 days. He did not pick up the last doxycycline I prescribed for him. He apparently took 4 days worth he already had. There is nothing open on his right leg and the edema control is really quite good. He's had damage in the left leg medially and laterally especially probably related to the prolonged use of Unna boots  02/12/18; the patient arrived in clinic today for a nurse visit/wrap change. He complained of a lot of pain in the left posterior calf. He is taking doxycycline that I previously prescribed for him. Unfortunately even though he used his stockings and apparently used to compression pumps twice a day he has weeping edema coming out of the lateral part of his right leg. This is coming from the lower anterior lateral skin area. 02/16/18; the patient has finished his doxycycline and will finish the amoxicillin 2 days. The area of cellulitis in the left calf posteriorly has resolved. He is no longer having any pain. He tells me he is using his compression pumps at least once a day sometimes twice. 02/23/18; the patient finished his doxycycline and Amoxil last week. On Friday he noticed a small erythematous circle about the size of a quarter on the left lower leg just above his ankle. This rapidly expanded and he now has erythema on the lateral and posterior part of the thigh. This is bright red. Also has an area on the dorsal foot just above his toes and a tender area just below the left popliteal fossa. He came off his prophylactic penicillin injections at his own insistence one or 2 months ago. This is obviously deteriorated since then 03/02/18; patient is on doxycycline  and Amoxil. Culture I did last week of the weeping area on the back of his left calf grew group B strep. I have therefore renewed the amoxicillin 500 3 times a day for a further week. He has not been systemically unwell. Still complaining of an area of discomfort right under his left popliteal fossa. There is no open wound on the right leg. He tells me that he is using his pumps twice a day on most days 03/09/18; patient arrives in clinic today completing his amoxicillin today. The cellulitis on his left leg is better. Furthermore he tells me that he had intramuscular penicillin shots that his primary care office today. However he also states that the wrap on his right leg fell down shortly after leaving clinic last week. He developed a large blister that was present when he came in for a nurse visit later in the week and then he developed intense discomfort around this area.He tells me he is using his compression pumps 03/16/18; the patient has completed his doxycycline. The infectious part of this/cellulitis in the left heel area left popliteal area is a lot better. He has 2 open areas on the right calf. Still areas on the left calf but this is a lot better as well. 03/24/18; the patient arrives complaining of pain in the left popliteal area again. He thinks some of this is wrap injury. He has no open area on the right leg and really no open area on the left calf either except for the popliteal area. He claims to be compliant with the compression pumps 03/31/18; I gave him doxycycline last week because of cellulitis in the left popliteal area. This is a lot better although the surface epithelium is denuded off and response to this. He arrives today with uncontrolled edema in the right calf area as well as a fingernail injury in the right lateral calf. There is only a few open areas on the left 04/06/18; I gave him amoxicillin doxycycline over the last 2 weeks that the amoxicillin should be completing  currently. He is not complaining of any pain or systemic symptoms. The only open areas see has is on the right lateral  lower leg paradoxically I cannot see anything on the left lower leg. He tells me he is using his compression pumps twice a day on most days. Silver alginate to the wounds that are open under 4 layer compression 04/13/18; he completed antibiotics and has no new complaints. Using his compression pumps. Silver alginate that anything that's opened 04/20/18; he is using his compression pumps religiously. Silver alginate 4 layer compression anything that's opened. He comes in today with no open wounds on the left leg but 3 on the right including a new one posteriorly. He has 2 on the right lateral and one on the right posterior. He likes Unna boots on the right leg for reasons that aren't really clear we had the usual 4 layer compression on the left. It may be necessary to move to the 4 layer compression on the right however for now I left them in the Unna boots 04/27/18; he is using his compression pumps at least once a day. He has still the wounds on the right lateral calf. The area right posteriorly has closed. He does not have an open wound on the left under 4 layer compression however on the dorsal left foot just proximal to the toes and the left third toe 2 small open areas were identified 05/11/18; he has not uses compression pumps. The areas on the right lateral calf have coalesced into one large wound necrotic surface. On the left side he has one small wound anteriorly however the edema is now weeping out of a large part of his left leg. He says he wasn't using his pumps because of the weeping fluid. I explained to him that this is the time he needs to pump more 05/18/18; patient states he is using his compression pumps twice a day. The area on the right lateral large wound albeit superficial. On the left side he has innumerable number of small new wounds on the left calf particularly  laterally but several anteriorly and medially. All these appear to have healthy granulated base these look like the remnants of blisters however they occurred under compression. The patient arrives in clinic today with his legs somewhat better. There is certainly less edema, less multiple open areas on the left calf and the right anterior leg looks somewhat better as well superficial and a little smaller. However he relates pain and erythema over the last 3-4 days in the thigh and I looked at this today. He has not been systemically unwell no fever no chills no change in blood sugar values 05/25/18; comes in today in a better state. The severe cellulitis on his left leg seems better with the Keflex. Not as tender. He has not been systemically unwell ooHard to find an open wound on the left lower leg using his compression pumps twice a day ooThe confluent wounds on his right lateral calf somewhat better looking. These will ultimately need debridement I didn't do this today. 06/01/18; the severe cellulitis on the left anterior thigh has resolved and he is completed his Keflex. ooThere is no open wound on the left leg however there is a superficial excoriation at the base of the third toe dorsally. Skin on the bottom of his left foot is macerated looking. ooThe left the wounds on the lateral right leg actually looks some better although he did require debridement of the top half of this wound area with an open curet 06/09/18 on evaluation today patient appears to be doing poorly in regard to his right lower  extremity in particular this appears to likely be infected he has very thick purulent discharge along with a bright green tent to the discharge. This makes me concerned about the possibility of pseudomonas. He's also having increased discomfort at this point on evaluation. Fortunately there does not appear to be any evidence of infection spreading to the other location at this time. 06/16/18 on  evaluation today patient appears to actually be doing fairly well. His ulcer has actually diminished in size quite significantly at this point which is good news. Nonetheless he still does have some evidence of infection he did see infectious disease this morning before coming here for his appointment. I did review the results of their evaluation and their note today. They did actually have him discontinue the Cipro and initiate treatment with linezolid at this time. He is doing this for the next seven days and they recommended a follow-up in four months with them. He is the keep a log of the need for intermittent antibiotic therapy between now and when he falls back up with infectious disease. This will help them gaze what exactly they need to do to try and help them out. 06/23/18; the patient arrives today with no open wounds on the left leg and left third toe healed. He is been using his compression pumps twice a day. On the right lateral leg he still has a sizable wound but this is a lot better than last time I saw this. 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 Has been using silver alginate under 4 layer compression 06/30/18; the only open wound he has is on the right lateral leg and this looks healthy. No debridement is required. We have been using silver alginate. He does not have an open wound on the left leg. There is apparently some drainage from the dorsal proximal third toe on the left although I see no open wound here. 07/03/18 on evaluation today patient was actually here just for a nurse visit rapid change. However when he was here on Wednesday for his rat change due to having been healed on the left and then developing blisters we initiated the wrap again knowing that he would be back today for [place] to reevaluate and see were at. Unfortunately he has developed some cellulitis into the  proximal portion of his right lower extremity even into the region of his thigh. 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 He was placed on one as what at that point. Nonetheless he is done with that and has been tolerating it well otherwise. Doxycycline which in the past really did not seem to be effective for him. Nonetheless I think the best option may be for [place] to definitely reinitiate the antibiotics for a longer period of time. 07/07/18; since I last saw this patient a week ago he has had a difficult time. At that point he did not have an open wound on his left leg. We transitioned him into juxta light stockings. He was apparently in the clinic the next day with blisters on the left lateral and left medial lower calf. He also had weeping edema fluid. He was put back into a compression wrap. He was also in the clinic on Friday with intense erythema in his right thigh. Per the patient he was started on Bactrim however that didn't work at all  in terms of relieving his pain and swelling. He has taken 3 doxycycline that he had left over from last time and that seems to of helped. He has blistering on the right thigh as well. 07/14/18; the erythema on his right thigh has gotten better with doxycycline that he is finishing. 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 Presumably this cellulitis in the thigh was not related to that although I think this is a bit concerning going forward. He still has an area on the right lateral calf the blister on the right medial calf just below the knee that was discussed above. On the left 2 small open areas left medial and left lateral. Edema control is adequate. He is using his compression pumps twice a day 07/20/18; continued improvement in the condition of both legs especially the edema in his bilateral thighs. He tells me he is been losing weight  through a combination of diet and exercise. He is using his compression pumps twice a day. So overall she made to the remaining wounds 07/27/2018; continued improvement in condition of both legs. His edema is well controlled. The area on the right lateral leg is just about closed he had one blisters show up on the medial left upper calf. We have him in 4 layer compression. He is going on a 10-day trip to [place], [place]. He will be driving. He wants to wear Unna boots because of the lessening amount of constriction. He will not use compression pumps while he is away 08/05/18 on evaluation today patient actually appears to be doing decently well all things considered in regard to his bilateral lower extremities. The worst ulcer is actually only posterior aspect of his left lower extremity with a four layer compression wrap cut into his leg a couple weeks back. He did have a trip and actually had [employer] for the trip that he is worn since he was last here. Nonetheless he feels like the [employer] actually do better for him his swelling is up a little bit but he also with his trip was not taking his Lasix on a regular set schedule like he was supposed to be. He states that obviously the reason being that he cannot drive and keep going without having to urinate too frequently which makes it difficult. He did not have his pumps with him while he was away either which I think also maybe playing a role here too. 08/13/2018; the patient only has a small open wound on the right lateral calf which is a big improvement in the last month or 2. He also has the area posteriorly just below the posterior fossa on the left which I think was a wrap injury from several weeks ago. He has no current evidence of cellulitis. He tells me he is back into his compression pumps twice a day. He also tells me that while he was at the laundromat somebody stole a section of his extremitease  stockings 08/20/2018; back in the clinic with a much improved state. He only has small areas on the right lateral mid calf which is just about healed. This was is more substantial area for quite a prolonged period of time. He has a small open area on the left anterior tibia. The area on the posterior calf just below the popliteal fossa is closed today. He is using his compression pumps twice  a day 08/28/2018; patient has no open wound on the right leg. He has a smattering of open areas on the calf with some weeping lymphedema. More problematically than that it looks as though his wraps of slipped down in his usual he has very angry upper area of edema just below the right medial knee and on the right lateral calf. He has no open area on his feet. The patient is traveling to [facility] next week. I will send him in an antibiotic. We will continue to wrap the right leg. We ordered extremitease stockings for him last week and I plan to transition the right leg to a stocking when he gets home which will be in 10 days time. As usual he is very reluctant to take his pumps with him when he travels 09/07/2018; patient returns from [facility]. He shows me a picture of his left leg in the mid part of his trip last week with intense fire engine erythema. The picture look bad enough I would have considered sending him to the hospital. Instead he went to the [name]. They did not prescribe him antibiotics but he did take some doxycycline he had leftover from a previous visit. I had given him trimethoprim sulfamethoxazole before he left this did not work according to the patient. This is resulted in some improvement fortunately. He comes back with a large wound on the left posterior calf. Smaller area on the left anterior tibia. Denuded blisters on the dorsal left foot over his toes. Does not have much in the way of wounds on the right leg although he does have a very tender  area on the right posterior area just below the popliteal fossa also suggestive of infection. He promises me he is back on his pumps twice a day 09/15/2018; the intense cellulitis in his left lower calf is a lot better. The wound area on the posterior left calf is also so better. However he has reasonably extensive wounds on the dorsal aspect of his second and third toes and the proximal foot just at the base of the toes. There is nothing open on the right leg 09/22/2018; the patient has excellent edema control in his legs bilaterally. He is using his external compression pumps twice a day. He has no open area on the right leg and only the areas in the left foot dorsally second and third toe area on the left side. He does not have any signs of active cellulitis. 10/06/2018; the patient has good edema control bilaterally. He has no open wound on the right leg. There is a blister in the posterior aspect of his left calf that we had to deal with today. He is using his compression pumps twice a day. There is no signs of active cellulitis. We have been using silver alginate to the wound areas. He still has vulnerable areas on the base of his left first second toes dorsally He has a his extremities stockings and we are going to transition him today into the stocking on the right leg. He is cautioned that he will need to continue to use the compression pumps twice a day. If he notices uncontrolled edema in the right leg he may need to go to 3 times a day. 10/13/2018; the patient came in for a nurse check on Friday he has a large flaccid blister on the right medial calf just below the knee. We unroofed this. He has this and a new  area underneath the posterior mid calf which was undoubtedly a blister as well. He also has several small areas on the right which is the area we put his extremities stocking on. 10/19/2018; the patient went to see infectious disease this morning I am not sure if that was a routine  follow-up in any case the doxycycline I had given him was discontinued and started on linezolid. He has not started this. It is easy to look at his left calf and the inflammation and think this is cellulitis however he is very tender in the tissue just below the popliteal fossa and I have no doubt that there is infection going on here. He states the problem he is having is that with the compression pumps the edema goes down and then starts walking the wrap falls down. We will see if we can adhere this. He has 1 or 2 minuscule open areas on the right still areas that are weeping on the posterior left calf, the base of his left second and third toes 10/26/18; back today in clinic with quite of skin breakdown in his left anterior leg. This may have been infection the area below the popliteal fossa seems a lot better however tremendous epithelial loss on the left anterior mid tibia area over quite inexpensive tissue. He has 2 blisters on the right side but no other open wound here. 10/29/2018; came in urgently to see [place] today and we worked him in for review. He states that the 4 layer compression on the right leg caused pain he had to cut it down to roughly his mid calf this caused swelling above the wrap and he has blisters and skin breakdown today. As a result of the pain he has not been using his pumps. Both legs are a lot more edematous and there is a lot of weeping fluid. 11/02/18; arrives in clinic with continued difficulties in the right leg> left. Leg is swollen and painful. multiple skin blisters and new open areas especially laterally. He has not been using his pumps on the right leg. 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 He is not systemically unwell. 11/09/2018; the patient claims he is being compliant with his pumps. He is finished the doxycycline I gave him last week. Culture I did of the wound on the right lateral leg showed a few very  resistant methicillin staph aureus. This was resistant to doxycycline. Nevertheless he states the pain in the leg is a lot better which makes me wonder if the cultured organism was not really what was causing the problem nevertheless this is a very dangerous organism to be culturing out of any wound. His right leg is still a lot larger than the left. He is using an Radio broadcast assistant on this area, he blames a 4-layer compression for causing the original skin breakdown which I doubt is true however I cannot talk him out of it. We have been using silver alginate to all of these areas which were initially blisters 11/16/2018; patient is being compliant with his external compression pumps at twice a day. Miraculously he arrives in clinic today with absolutely no open wounds. He has better edema control on the left where he has been using 4 layer compression versus wound of wounds on the right and I pointed this out to him. There is no inflammation in the skin in his lower legs which is also somewhat unusual for him. There is no open wounds on  the dorsal left foot. He has extremitease stockings at home and I have asked him to bring these in next week. 11/25/18 patient's lower extremity on examination today on the left appears for the most part to be wound free. He does have an open wound on the lateral aspect of the right lower extremity but this is minimal compared to what I've seen in past. He does request that we go ahead and wrap the left leg as well even though there's nothing open just so hopefully it will not reopen in short order. 1/28; patient has superficial open wounds on the right lateral calf left anterior calf and left posterior calf. His edema control is adequate. 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 We have been using silver alginate as the primary dressing. He claims compliance with his compression pumps 2/4;  patient has superficial open wounds on numerous areas of his left calf and again one on the left dorsal foot. The areas on the right lateral calf have healed. The cellulitis that I gave him doxycycline for last week is also resolved this was mostly on the left anterior calf just below the tibial tuberosity. His edema looks fairly well-controlled. He tells me he went to see his primary doctor today and had blood work ordered 2/11; once again he has several open areas on the left calf left tibial area. Most of these are small and appear to have healthy granulation. He does not have anything open on the right. The edema and control in his thighs is pretty good which is usually a good indication he has been using his pumps as requested. 2/18; he continues to have several small areas on the left calf and left tibial area. Most of these are small healthy granulation. We put him in his stocking on the right leg last week and he arrives with a superficial open area over the right upper tibia and a fairly large area on the right lateral tibia in similar condition. His edema control actually does not look too bad, he claims to be using his compression pumps twice a day 2/25. Continued small areas on the left calf and left tibial area. New areas especially on the right are identified just below the tibial tuberosity and on the right upper tibia itself. There are also areas of weeping edema fluid even without an obvious wound. He does not have a considerable degree of lymphedema but clearly there is more edema here than his skin can handle. He states he is using the pumps twice a day. We have an Unna boot on the right and 4 layer compression on the left. 3/3; he continues to have an area on the right lateral calf and right posterior calf just below the popliteal fossa. There is a fair amount of tenderness around the wound on the popliteal fossa but I did not see any evidence of cellulitis, could just be that the  wrap came down and rubbed in this area. ooHe does not have an open area on the left leg however there is an area on the left dorsal foot at the base of the third toe ooWe have been using silver alginate to all wound areas 3/10; he did not have an open area on his left leg last time he was here a week ago. T oday he arrives with a horizontal wound just below the tibial tuberosity and an area on the  left lateral calf. He has intense erythema and tenderness in this area. The area is on the right lateral calf and right posterior calf better than last week. We have been using silver alginate as usual 3/18 - Patient returns with 3 small open areas on left calf, and 1 small open area on right calf, the skin looks ok with no significant erythema, he continues the UNA boot on right and 4 layer compression on left. The right lateral calf wound is closed , the right posterior is small area. we will continue silver alginate to the areas. 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 He is been tolerating the dressing changes without complication. Fortunately she has made excellent improvement in regard to the overall status of his wounds. Unfortunately every time we cease wrapping him he ends up reopening in causing more significant issues at that point. Again I'm unsure of the best direction to take although I think the lymphedema clinic may be appropriate for him. 02/03/19 on evaluation today patient appears to be doing well in regard to the wounds that we saw him for last week unfortunately he has a new area on the proximal portion of his right medial/posterior lower extremity where the wrap somewhat slowed down and caused swelling and a blister to rub and open. Unfortunately this is the only opening that he has on  either leg at this point. 02/17/19 on evaluation today patient's bilateral lower extremities appear to be doing well. He still completely healed in regard to the left lower extremity. In regard to the right lower extremity the area where the wrap and slid down and caused the blister still seems to be slightly open although this is dramatically better than during the last evaluation two weeks ago. I'm very pleased with the way this stands overall. 03/03/19 on evaluation today patient appears to be doing well in regard to his right lower extremity in general although he did have a new blister open this does not appear to be showing any evidence of active infection at this time. Fortunately there's No fevers, chills, nausea, or vomiting noted at this time. Overall I feel like he is making good progress it does feel like that the right leg will we perform the [employer] seems to do with a bit better than three layer wrap on the left which slid down on him. We may switch to doing bilateral in the book wraps. 5/4; I have not seen Mr. Wenzlick in quite some time. According to our case manager he did not have an open wound on his left leg last week. He had 1 remaining wound on the right posterior medial calf. He arrives today with multiple openings on the left leg probably were blisters and/or wrap injuries from Unna boots. I do not think the Unna boot's will provide adequate compression on the left. I am also not clear about the frequency he is using the compression pumps. 03/17/19 on evaluation today patient appears to be doing excellent in regard to his lower extremities compared to last week's evaluation apparently. He had gotten significantly worse last week which is unfortunate. The [employer] wrap on the left did not seem to do very well for him at all and in fact it didn't control his swelling significantly enough he had an additional outbreak.  Subsequently we go back to the four layer compression wrap on the  left. This is good news. At least in that he is doing better and the wound seem to be killing him. He still has not heard anything from the lymphedema clinic. 03/24/19 on evaluation today patient actually appears to be doing much better in regard to his bilateral lower Trinity as compared to last week when I saw him. Fortunately there's no signs of active infection at this time. He has been tolerating the dressing changes without complication. Overall I'm extremely pleased with the progress and appearance in general. 04/07/19 on evaluation today patient appears to be doing well in regard to his bilateral lower extremities. His swelling is significantly down from where it was previous. With that being said he does have a couple blisters still open at this point but fortunately nothing that seems to be too severe and again the majority of the larger openings has healed at this time. 04/14/19 on evaluation today patient actually appears to be doing quite well in regard to his bilateral lower extremities in fact I'm not even sure there's anything significantly open at this time at any site. Nonetheless he did have some trouble with these wraps where they are somewhat irritating him secondary to the fact that he has noted that the graph wasn't too close down to the end of this foot in a little bit short as well up to his knee. Otherwise things seem to be doing quite well. 04/21/19 upon evaluation today patient's wound bed actually showed evidence of being completely healed in regard to both lower extremities which is excellent news. There does not appear to be any signs of active infection which is also good news. I'm very pleased in this regard. No fevers, chills, nausea, or vomiting noted at this time. 04/28/19 on evaluation today patient appears to be doing a little bit worse in regard to both lower extremities on the left mainly due to the fact that when he went infection disease the wrap was not wrapped  quite high enough he developed a blister above this. On the right he is a small open area of nothing too significant but again this is continuing to give him some trouble he has been were in the Velcro compression that he has at home. 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 He's been tolerating the dressing changes without complication. Fortunately there's no signs of active infection at this time. No fevers, chills, nausea, or vomiting noted at this time. We have been trying to get an appointment with her lymphedema clinic in [name] but unfortunately nobody can get them on phone with not been able to even fax information over the patient likewise is not been able to get in touch with them. Overall I'm not sure exactly what's going on here with to reach out again today. 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 Still having a lot of drainage unfortunately. He tells me especially in the left but even on the right. There's no signs of active infection which is good news we've been using so ratcheted up to this point. 05/19/19 on evaluation today patient actually appears to be doing quite well with regard to his left lower extremity which is great news. Fortunately in regard to the right lower extremity has an issues with his wrap and he  subsequently did remove this from what I'm understanding. Nonetheless long story short is what he had rewrapped once he removed it subsequently had maggots underneath this wrap whenever he came in for evaluation today. With that being said they were obviously completely cleaned away by the nursing staff. The visit today which is excellent news. However he does appear to potentially have some infection around the right ankle region where the maggots were located as well. He will likely require anabiotic  therapy today. 05/26/19 on evaluation today patient actually appears to be doing much better in regard to his bilateral lower extremities. I feel like the infection is under much better control. With that being said there were maggots noted when the wrap was removed yet again today. Again this could have potentially been left over from previous although at this time there does not appear to be any signs of significant drainage there was obviously on the wrap some drainage as well this contracted gnats or otherwise. Either way I do not see anything that appears to be doing worse in my pinion and in fact I think his drainage has slowed down quite significantly likely mainly due to the fact to his infection being under better control. 06/02/2019 on evaluation today patient actually appears to be doing well with regard to his bilateral lower extremities there is no signs of active infection at this time which is great news. With that being said he does have several open areas more so on the right than the left but nonetheless these are all significantly better than previously noted. 06/09/2019 on evaluation today patient actually appears to be doing well. His wrap stayed up and he did not cause any problems he had more drainage on the right compared to the left but overall I do not see any major issues at this time which is great news. 06/16/2019 on evaluation today patient appears to be doing excellent with regard to his lower extremities the only area that is open is a new blister that can have opened as of today on the medial ankle on the left. Other than this he really seems to be doing great I see no major issues at this point. 06/23/2019 on evaluation today patient appears to be doing quite well with regard to his bilateral lower extremities. In fact he actually appears to be almost completely healed there is a small area of weeping noted of the right lower extremity just above the ankle. Nonetheless  fortunately there is no signs of active infection at this time which is good news. No fevers, chills, nausea, vomiting, or diarrhea. 8/24; the patient arrived for a nurse visit today but complained of very significant pain in the left leg and therefore I was asked to look at this. Noted that he did not have an open area on the left leg last week nevertheless this was wrapped. The patient states that he is not been able to put his compression pumps on the left leg because of the discomfort. He has not been systemically unwell 06/30/2019 on evaluation today patient unfortunately despite being excellent last week is doing much worse with regard to his left lower extremity today. In fact he had to come in for a nurse on Monday where his left leg had to be rewrapped due to excessive weeping Dr. Leanord Hawking placed him on doxycycline at that point. Fortunately there is no signs of active infection Systemically at this time which is good news. 07/07/2019 in regard to the patient's wounds  today he actually seems to be doing well with his right lower extremity there really is nothing open or draining at this point this is great news. Unfortunately the left lower extremity is given him additional trouble at this time. There does not appear to be any signs of active infection nonetheless he does have a lot of edema and swelling noted at this point as well as blistering all of which has led to a much more poor appearing leg at this time compared to where it was 2 weeks ago when it was almost completely healed. Obviously this is a little discouraging for the patient. He is try to contact the lymphedema clinic in [place] he has not been able to get through to them. 07/14/2019 on evaluation today patient actually appears to be doing slightly better with regard to his left lower extremity ulcers. Overall I do feel like at least at the top of the wrap that we have been placing this area has healed quite nicely and looks much  better. The remainder of the leg is showing signs of improvement. Unfortunately in the thigh area he still has an open region on the left and again on the right he has been utilizing just a Band-Aid on an area that also opened on the thigh. Again this is an area that were not able to wrap although we did do an Ace wrap to provide some compression that something that obviously is a little less effective than the compression wraps we have been using on the lower portion of the leg. He does have an appointment with the lymphedema clinic in [name] on Friday. 07/21/2019 on evaluation today patient appears to be doing better with regard to his lower extremity ulcers. He has been tolerating the dressing changes without complication. Fortunately there is no signs of active infection at this time. No fevers, chills, nausea, vomiting, or diarrhea. I did receive the paperwork from the physical therapist at the lymphedema clinic in [place]. Subsequently I signed off on that this morning and sent that back to him for further progression with the treatment plan. 07/28/2019 on evaluation today patient appears to be doing very well with regard to his right lower extremity where I do not see any open wounds at this point. Fortunately he is feeling great as far as that is concerned as well. In regard to the left lower extremity he has been having issues with still several areas of weeping and edema although the upper leg is doing better his lower leg still I think is going require the compression wrap at this time. No fevers, chills, nausea, vomiting, or diarrhea. 08/04/2019 on evaluation today patient unfortunately is having new wounds on the right lower extremity. Again we have been using Unna boot wrap on that side. We switched him to using his juxta lite wrap at home. With that being said he tells me he has been using it although his legs extremely swollen and to be honest really does not appear that he  has been. I cannot know that for sure however. Nonetheless he has multiple new wounds on the right lower extremity at this time. Obviously we will have to see about getting this rewrapped for him today. 08/11/2019 on evaluation today patient appears to be doing fairly well with regard to his wounds. He has been tolerating the dressing changes including the compression wraps without complication. He still has a lot of edema in his upper thigh regions bilaterally he is supposed to be seeing  the lymphedema clinic on the 15th of this month once his wraps arrive for the upper part of his legs. 08/18/2019 on evaluation today patient appears to be doing well with regard to his bilateral lower extremities at this point. He has been tolerating the dressing changes without complication. Fortunately there is no signs of active infection which is also good news. He does have a couple weeping areas on the first and second toe of the right foot he also has just a small area on the left foot upper leg and a small area on the left lower leg but overall he is doing quite well in my opinion. He is supposed to be getting his wraps shortly in fact tomorrow and then subsequently is seeing the lymphedema clinic next Wednesday on the 21st. Of note he is also leaving on the 25th to go on vacation for a week to the beach. For that reason and since there is some uncertainty about what there can be doing at lymphedema clinic next Wednesday I am get a make an appointment for next Friday here for [place] to see what we need to do for him prior to him leaving for vacation. 10/23; patient arrives in considerable pain predominantly in the upper posterior calf just distal to the popliteal fossa also in the wound anteriorly above the major wound. This is probably cellulitis and he has had this recurrently in the past. He has no open wound on the right side and he has had an Radio broadcast assistant in that area. Finally I note that he has an area on the  left posterior calf which by enlarge is mostly epithelialized. This protrudes beyond the borders of the surrounding skin in the setting of dry scaly skin and lymphedema. The patient is leaving for [name] on Sunday. Per his longstanding pattern, he will not take his compression pumps with him predominantly out of fear that they will be stolen. He therefore asked that we put a Unna boot back on the right leg. He will also contact the wound care [name] in [facility] to see if they can change his dressing in the mid week. 11/3; patient returned from his vacation to [name]. He was seen on 1 occasion at their wound care [name]. They did a 2 layer compression system as they did not have our 4-layer wrap. I am not completely certain what they put on the wounds. They did not change the Unna boot on the right. The patient is also seeing a lymphedema specialist physical therapist in [place]. It appears that he has some compression sleeve for his thighs which indeed look quite a bit better than I am used to seeing. He pumps over these with his external compression pumps. 11/10; the patient has a new wound on the right medial thigh otherwise there is no open areas on the right. He has an area on the left leg posteriorly anteriorly and medially and an area over the left second toe. We have been using silver alginate. He thinks the injury on his thigh is secondary to friction from the compression sleeve he has. 11/17; the patient has a new wound on the right medial thigh last week. He thinks this is because he did not have a underlying stocking for his thigh juxta lite apparatus. He now has this. The area is fairly large and somewhat angry but I do not think he has underlying cellulitis. ooHe has a intact blister on the right anterior tibial area. ooSmall wound on  the right great toe dorsally ooSmall area on the medial left calf. 11/30; the patient does not have any open areas on his right  leg and we did not take his juxta lite stocking off. However he states that on Friday his compression wrap fell down lodging around his upper mid calf area. As usual this creates a lot of problems for him. He called urgently today to be seen for a nurse visit however the nurse visit turned into a provider visit because of extreme erythema and pain in the left anterior tibia extending laterally and posteriorly. The area that is problematic is extensive 10/06/2019 upon evaluation today patient actually appears to be doing poorly in regard to his left lower extremity. He Dr. Leanord Hawking did place him on doxycycline this past Monday apparently due to the fact that he was doing much worse in regard to this left leg. Fortunately the doxycycline does seem to be helping. Unfortunately we are still having a very difficult time getting his edema under any type of control in order to anticipate discharge at some point. The only way were really able to control his lymphedema really is with compression wraps and that has only even seemingly temporary. He has been seeing a lymphedema clinic they are trying to help in this regard but still this has been somewhat frustrating in general for the patient. 10/13/19 on evaluation today patient appears to be doing excellent with regard to his right lower extremity as far as the wounds are concerned. His swelling is still quite extensive unfortunately. He is still having a lot of drainage from the thigh areas bilaterally which is unfortunate. He's been going to lymphedema clinic but again he still really does not have this edema under control as far as his lower extremities are concern. With regard to his left lower extremity this seems to be improving and I do believe the doxycycline has been of benefit for him. He is about to complete the doxycycline. 10/20/2019 on evaluation today patient appears to be doing poorly in regard to his bilateral lower extremities. More in the right  thigh he has a lot of irritation at this site unfortunately. In regard to the left lower extremity the wrap was not quite as high it appears and does seem to have caused him some trouble as well. Fortunately there is no evidence of systemic infection though he does have some blue-green drainage which has me concerned for the possibility of Pseudomonas. He tells me he is previously taking Cipro without complications and he really does not care for Levaquin however due to some of the side effects he has. He is not allergic to any medications specifically antibiotics that were aware of. 10/27/2019 on evaluation today patient actually does appear to be for the most part doing better when compared to last week's evaluation. With that being said he still has multiple open wounds over the bilateral lower extremities. He actually forgot to start taking the Cipro and states that he still has the whole bottle. He does have several new blisters on left lower extremity today I think I would recommend he go ahead and take the Cipro based on what I am seeing at this point. 12/30-Patient comes at 1 week visit, 4 layer compression wraps on the left and Unna boot on the right, primary dressing Xtrasorb and silver alginate. Patient is taking his Cipro and has a few more days left probably 5-6, and the legs are doing better. He states he is using his  compressions devices which I believe he has 11/10/2019 on evaluation today patient actually appears to be much better than last time I saw him 2 weeks ago. His wounds are significantly improved and overall I am very pleased in this regard. Fortunately there is no signs of active infection at this time. He is just a couple of days away from completing Cipro. Overall his edema is much better he has been using his lymphedema pumps which I think is also helping at this point. 11/17/2019 on evaluation today patient appears to be doing excellent in regard to his wounds in general.  His legs are swollen but not nearly as much as they have been in the past. Fortunately he is tolerating the compression wraps without complication. No fevers, chills, nausea, vomiting, or diarrhea. He does have some erythema however in the distal portion of his right lower extremity specifically around the forefoot and toes there is a little bit of warmth here as well. 11/24/2019 on evaluation today patient appears to be doing well with regard to his right lower extremity I really do not see any open wounds at this point. His left lower extremity does have several open areas and his right medial thigh also is open. Other than this however overall the patient seems to be making good progress and I am very pleased at this point. 12/01/2019 on evaluation today patient appears to be doing poorly at this point in regard to his left lower extremity has several new blisters despite the fact that we have him in compression wraps. In fact he had a 4-layer compression wrap, his upper thigh wrapped from lymphedema clinic, and a juxta light over top of the 4 layer compression wrap the lymphedema clinic applied and despite all this he still develop blisters underneath. Obviously this does have me concerned about the fact that unfortunately despite what we are doing to try to get wounds healed he continues to have new areas arise I do not think he is ever good to be at the point where he can realistically just use wraps at home to keep things under control. Typically when we heal him it takes about 1-2 days before he is back in the clinic with severe breakdown and blistering of his lower extremities bilaterally. This is happened numerous times in the past. Unfortunately I think that we may need some help as far as overall fluid overload to kind of limit what we are seeing and get things under better control. 12/08/2019 on evaluation today patient presents for follow-up concerning his ongoing bilateral lower extremity  edema. Unfortunately he is still having quite a bit of swelling the compression wraps are controlling this to some degree but he did see Dr. [name] his cardiologist I do have that available for review today as far as the appointment was concerned that was on 12/06/2019. Obviously that she has been 2 days ago. The patient states that he is only been taking the Lasix 80 mg 1 time a day he had told me previously he was taking this twice a day. Nonetheless Dr. [name] recommended this be up to 80 mg 2 times a day for the patient as he did appear to be fluid overloaded. With that being said the patient states he did this yesterday and he was unable to go anywhere or do anything due to the fact that he was constantly having to urinate. Nonetheless I think that this is still good to be something that is important for him as far  as trying to get his edema under control at all things that he is going to be able to just expect his wounds to get under control and things to be better without going through at least a period of time where he is trying to stabilize his fluid management in general and I think increasing the Lasix is likely the first step here. It was also mentioned the possibility that the patient may require metolazone. With that being said he wanted to have the patient take Lasix twice a day first and then reevaluating 2 months to see where things stand. 12/15/2019 upon evaluation today patient appears to be doing regard to his legs although his toes are showing some signs of weeping especially on the left at this point to some degree on the right. There does not appear to be any signs of active infection and overall I do feel like the compression wraps are doing well for him but he has not been able to take the Lasix at home and the increased dose that Dr. [name] recommended. He tells me that just not go to be feasible for him. Nonetheless I think in this case he should probably send a message to Dr. [name]  in order to discuss options from the standpoint of possible admission to get the fluid off or otherwise going forward. 12/22/2019 upon evaluation today patient appears to be doing fairly well with regard to his lower extremities at this point. In fact he would be doing excellent if it was not for the fact that his right anterior thigh apparently had an allergic reaction to adhesive tape that he used. The wound itself that we have been monitoring actually appears to be healed. There is a lot of irritation at this point. 12/29/2019 upon evaluation today patient appears to be doing well in regard to his lower extremities. His left medial thigh is open and somewhat draining today but this is the only region that is open the right has done much better with the treatment utilizing the steroid cream that I prescribed for him last week. Overall I am pleased in that regard. Fortunately there is no signs of active infection at this time. No fevers, chills, nausea, vomiting, or diarrhea. 01/05/2020 upon evaluation today patient appears to be doing more poorly in regard to his right lower extremity at this point upon evaluation today. Unfortunately he continues to have issues in this regard and I think the biggest issue is controlling his edema. This obviously is not very well controlled at this point is been recommended that he use the Lasix twice a day but he has not been able to do that. Unfortunately I think this is leading to an issue where honestly he is not really able to effectively control his edema and therefore the wounds really are not doing significantly better. I do not think that he is going to be able to keep things under good control unless he is able to control his edema much better. I discussed this again in great detail with him today. 01/12/2020 good news is patient actually appears to be doing quite well today at this point. He does have an appointment with lymphedema clinic tomorrow. His legs  appear healed and the toe on the left is almost completely healed. In general I am very pleased with how things stand at this point. 01/19/2020 upon evaluation today patient appears to actually be doing well in regard to his lower extremities there is nothing open at this point.  Fortunately he has done extremely well more recently. Has been seeing lymphedema clinic as well. With that being said he has Velcro wraps for his lower legs as well as his upper legs. The only wound really is on his toe which is the right great toe and this is barely anything even there. With all that being said I think it is good to be appropriate today to go ahead and switch him over to the Velcro compression wraps. 01/26/2020 upon evaluation today patient appears to be doing worse with regard to his lower extremities after last week switch him to Velcro compression wraps. Unfortunately he lasted less than 24 hours he did not have the sock portion of his Velcro wrap on the left leg and subsequently developed a blister underneath the Velcro portion. Obviously this is not good and not what we were looking for at this point. He states the lymphedema clinic did tell him to wear the wrap for 23 hours and take him off for 1 I am okay with that plan but again right now we got a get things back under control again he may have some cellulitis noted as well. 02/02/2020 upon evaluation today patient unfortunately appears to have several areas of blistering on his bilateral lower extremities today mainly on the feet. His legs do seem to be doing somewhat better which is good news. Fortunately there is no evidence of active infection at this time. No fevers, chills, nausea, vomiting, or diarrhea. 02/16/2020 upon evaluation today patient appears to be doing well at this time with regard to his legs. He has a couple weeping areas on his toes but for the most part everything is doing better and does appear to be sealed up on his legs which is  excellent news. We can continue with wrapping him at this point as he had every time we discontinue the wraps he just breaks out with new wounds. There is really no point in is going forward with this at this point. 03/08/2020 upon evaluation today patient actually appears to be doing quite well with regard to his lower extremity ulcers. He has just a very superficial and really almost nonexistent blister on the left lower extremity he has in general done very well with the compression wraps. With that being said I do not see any signs of infection at this time which is good news. 03/29/2020 upon evaluation today patient appears to be doing well with regard to his wounds currently except for where he had several new areas that opened up due to some of the wrap slipping and causing him trouble. He states he did not realize they had slipped. Nonetheless he has a 1 area on the right and 3 new areas on the left. Fortunately there is no signs of active infection at this time which is great news. 04/05/2020 upon evaluation today patient actually appears to be doing quite well in general in regard to his legs currently. Fortunately there is no signs of active infection at this time. No fevers, chills, nausea, vomiting, or diarrhea. He tells me next week that he will actually be seen in the lymphedema clinic on Thursday at 10 AM I see him on Wednesday next week. 04/12/2020 upon evaluation today patient appears to be doing very well with regard to his lower extremities bilaterally. In fact he does not appear to have any open wounds at this point which is good news. Fortunately there is no signs of active infection at this time. No fevers,  chills, nausea, vomiting, or diarrhea. 04/19/2020 upon evaluation today patient appears to be doing well with regard to his wounds currently on the bilateral lower extremities. There does not appear to be any signs of active infection at this time. Fortunately there is no evidence of  systemic infection and overall very pleased at this point. Nonetheless after I held him out last week he literally had blisters the next morning already which swelled up with him being right back here in the clinic. Overall I think that he is just not can be able to be discharged with his legs the way they are he is much to volume overloaded as far as fluid is concerned and that was discussed with him today of also discussed this but should try the clinic nurse manager as well as Dr. Leanord Hawking. 04/26/2020 upon evaluation today patient appears to be doing better with regard to his wounds currently. He is making some progress and overall swelling is under good control with the compression wraps. Fortunately there is no evidence of active infection at this time. 05/10/2020 on evaluation today patient appears to be doing overall well in regard to his lower extremities bilaterally. He is Tolerating the compression wraps without complication and with what we are seeing currently I feel like that he is making excellent progress. There is no signs of active infection at this time. 05/24/2020 upon evaluation today patient appears to be doing well in regard to his legs. The swelling is actually quite a bit down compared to where it has been in the past. Fortunately there is no sign of active infection at this time which is also good news. With that being said he does have several wounds on his toes that have opened up at this point. 05/31/2020 upon evaluation today patient appears to be doing well with regard to his legs bilaterally where he really has no significant fluid buildup at this point overall he seems to be doing quite well. Very pleased in this regard. With regard to his toes these also seem to be drying up which is excellent. We have continue to wrap him as every time we tried as a transition to the juxta light wraps things just do not seem to get any better. 06/07/2020 upon evaluation today patient appears to  be doing well with regard to his right leg at this point. Unfortunately left leg has a lot of blistering he tells me the wrap started to slide down on him when he tried to put his other Velcro wrap over top of it to help keep things in order but nonetheless still had some issues. 06/14/2020 on evaluation today patient appears to be doing well with regard to his lower extremity ulcers and foot ulcers at this point. I feel like everything is actually showing signs of improvement which is great news overall there is no signs of active infection at this time. No fevers, chills, nausea, vomiting, or diarrhea. 06/21/2020 on evaluation today patient actually appears to be doing okay in regard to his wounds in general. With that being said the biggest issue I see is on his right foot in particular the first and second toe seem to be doing a little worse due to the fact this is staying very wet. I think he is probably getting need to change out his dressings a couple times in between each week when we see him in regard to his toes in order to keep this drier based on the location and how this  is proceeding. 06/28/2020 on evaluation today patient appears to be doing a little bit more poorly overall in regard to the appearance of the skin I am actually somewhat concerned about the possibility of him having a little bit of an infection here. We discussed the course of potentially giving him a doxycycline prescription which he is taken previously with good result. With that being said I do believe that this is potentially mild and at this point easily fixed. I just do not want anything to get any worse. 07/12/2020 upon evaluation today patient actually appears to be making some progress with regard to his legs which is great news there does not appear to be any evidence of active infection. Overall very pleased with where things stand. 07/26/2020 upon evaluation today patient appears to be doing well with regard to his  leg ulcers and toe ulcers at this point. He has been tolerating the compression wraps without complication overall very pleased in this regard. 08/09/2020 upon evaluation today patient appears to be doing well with regard to his lower extremities bilaterally. Fortunately there is no signs of active infection overall I am pleased with where things stand. 08/23/2020 on evaluation today patient appears to be doing well with regard to his wound. He has been tolerating the dressing changes without complication. Fortunately there is no signs of active infection at this time. Overall his legs seem to be doing quite well which is great news and I am very pleased in that regard. No fevers, chills, nausea, vomiting, or diarrhea. 09/13/2020 upon evaluation today patient appears to be doing okay in regard to his lower extremities. He does have a fairly large blister on the right leg which I did remove the blister tissue from today so we can get this to dry out other than that however he seems to be doing quite well. There is no signs of active infection at this time. 09/27/2020 upon evaluation today patient appears to actually be doing some better in regard to his right leg. Fortunately signs of active infection at this time which is great news. No fevers, chills, nausea, vomiting, or diarrhea. 10/04/2020 upon evaluation today patient actually appears to be showing signs of improvement which is great news with regard to his leg ulcers. Fortunately there is no signs of active infection which is great news he is still taking the antibiotics currently. No fevers, chills, nausea, vomiting, or diarrhea. 10/18/2020 on evaluation today patient appears to be doing well with regard to his legs currently. He has been tolerating the dressing changes including the wraps without complication. Fortunately there is no signs of active infection at this time. No fevers, chills, nausea, vomiting, or diarrhea. 10/25/2020 upon  evaluation today patient appears to be doing decently well in regard to his wounds currently. He has been tolerating the dressing changes without complication. Overall I feel like he is making good progress albeit slow. Again this is something we can have to continue to wrap for some time to come most likely. 11/08/2020 upon evaluation today patient appears to be doing well with regard to his wounds currently. He has been tolerating the dressing changes without complication is not currently on any antibiotics and he does not appear to show any signs of infection. He does continue to have a lot of drainage on the right leg not too severe but nonetheless this is very scattered. On the left leg this is looking to be much improved overall. 11/15/2020 upon evaluation today patient appears to be  doing better with regard to his legs bilaterally. Especially the right leg which was much more significant last week. There does not appear to be any signs of active infection which is great news. No fevers, chills, nausea, vomiting, or diarrhea. 11/23/2019 upon evaluation today patient appears to be doing poorly still in regard to his lower extremities bilaterally. Unfortunately his right leg in particular appears to be doing much more poorly there is no signs really of infection this is not warm to touch but he does have a lot of drainage and weeping unfortunately. With that reason I do believe that we may need to initiate some treatment here to try to help calm down some of the swelling of the right leg. I think switching to a 4-layer compression wrap would be beneficial here. The patient is in agreement with giving this a try. 11/29/2020 upon evaluation today patient appears to be doing well currently in regard to his leg ulcers. I feel like the right leg is doing better he still has a lot of drainage but we do see some improvement here. The 4-layer compression wrap I think was helpful. 12/06/2020 upon evaluation today  patient appears to be doing well with regard to his legs. In fact they seem to be doing about the best I have seen up to this point. Fortunately there is no signs of active infection at this time. No fevers, chills, nausea, vomiting, or diarrhea. 12/20/2020 upon evaluation today patient appears to be doing well at this time in regard to his legs. He is not having any significant draining which is great news. Fortunately there is no signs of active infection at this time. No fevers, chills, nausea, vomiting, or diarrhea. 01/17/2021 upon evaluation today Tucker actually appears to be doing excellent in regard to his legs. He has a few areas again that come and go as far as his toes are concerned but overall this is doing quite well. 01/31/2021 upon evaluation today patient appears to be doing well with regard to his legs. Fortunately there does not appear to be any signs of active infection which is great news. Overall he is still having significant edema despite the compression wraps basically the 4-layer compression wrap to just keep things under control there is really not much room for play. 4/13: Mr. Strausbaugh is a longstanding patient in our clinic and benefits greatly from weekly compression wraps. Today he has no complaints. He has been tolerating the wraps well. He states he is using the lymphedema pumps at home. 5/4; patient presents for follow-up of his chronic lymphedema/venous insufficiency ulcers. He comes weekly for compression wraps. He has no complaints today. He was unable to tolerate the Coflex 2 layer Last week so we will do the four press 4-layer compression. He has been using his lymphedema pumps daily. 5/18; patient presents for 2-week follow-up. He has no complaints or issues today. He has developed a new wound to the right foot on his fourth toe. He overall feels well and denies signs of infection. 6/1; patient presents for 2-week follow-up. He has no complaints or issues today. He  denies signs of infection. 04/18/2021 upon evaluation today patient appears to be doing well with regard to his legs bilaterally. Family open wound is actually on the toe of his left foot everything else is completely closed which is great news. In general I am extremely pleased with where things stand at this point. The patient is also happy that things are doing so well. 05/02/2021  upon evaluation today patient's legs actually appear to be doing quite well today. Fortunately there does not appear to be any signs of active infection which is great and overall I am extremely pleased with where he stands today. The patient does not appear to have any evidence of active infection at this time which is also great news. 05/09/2021 upon evaluation today patient appears to be doing a little bit more poorly in regard to his legs. Unfortunately he is having issues with some breakdown and a blood blister on the left leg this is due to I believe honestly to how it was wrapped last week. Fortunately there does not appear to be any signs of infection but nonetheless this is still a concern to be honest. No fevers, chills, nausea, vomiting, or diarrhea. 05/16/2021 upon evaluation today patient appears to be doing significantly better as compared to last week. I am very pleased with where things stand today. There does not appear to be any signs of infection which is great news and overall very pleased with where we stand. No fevers, chills, nausea, vomiting, or diarrhea. Objective Constitutional Obese and well-hydrated in no acute distress. Vitals Time Taken: 10:31 AM, Height: 70 in, Weight: 380.2 lbs, BMI: 54.5, Temperature: 98.4 F, Pulse: 84 bpm, Respiratory Rate: 22 breaths/min, Blood Pressure: 153/78 mmHg, Capillary Blood Glucose: 145 mg/dl. Respiratory normal breathing without difficulty. Psychiatric this patient is able to make decisions and demonstrates good insight into disease process. Alert and  Oriented x 3. pleasant and cooperative. General Notes: Patient's wound bed showed signs of good granulation and epithelization at this point. There does not appear to be any evidence of infection which is great news and overall I am extremely pleased with where we stand. The large blood blister noted last week is pretty much almost completely resolved which is excellent news as well. Integumentary (Hair, Skin) Wound #193 status is Open. Original cause of wound was Gradually Appeared. The date acquired was: 01/31/2021. The wound has been in treatment 15 weeks. The wound is located on the Left T Second. The wound measures 0.7cm length x 0.8cm width x 0.1cm depth; 0.44cm^2 area and 0.044cm^3 volume. There is oe Fat Layer (Subcutaneous Tissue) exposed. There is no tunneling or undermining noted. There is a medium amount of serosanguineous drainage noted. The wound margin is distinct with the outline attached to the wound base. There is large (67-100%) red, pink granulation within the wound bed. There is no necrotic tissue within the wound bed. Wound #197 status is Open. Original cause of wound was Gradually Appeared. The date acquired was: 05/09/2021. The wound has been in treatment 1 weeks. The wound is located on the Left,Anterior Lower Leg. The wound measures 2cm length x 1.5cm width x 0.1cm depth; 2.356cm^2 area and 0.236cm^3 volume. There is Fat Layer (Subcutaneous Tissue) exposed. There is no tunneling or undermining noted. There is a medium amount of sanguinous drainage noted. The wound margin is distinct with the outline attached to the wound base. There is large (67-100%) red granulation within the wound bed. There is no necrotic tissue within the wound bed. 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 There was a Four Layer Compression Therapy Procedure by Antonieta Iba, RN. Post procedure Diagnosis Wound #197: Same as Pre-Procedure There was a Four Layer Compression Therapy Procedure by Antonieta Iba, RN. Post procedure Diagnosis Wound #: Same as Pre-Procedure Plan Follow-up Appointments: Return Appointment in 2 weeks. - with Leonard Schwartz Nurse Visit: - Next Week: Dressing Change Bathing/ Shower/ Hygiene: May shower with protection but do not get wound dressing(s) wet. Edema Control - Lymphedema / SCD / Other: Lymphedema Pumps. Use Lymphedema pumps on leg(s) 2-3 times a day for 45-60 minutes. If wearing any wraps or hose, do not remove them. Continue exercising as instructed. Elevate legs to the level of the heart or above for 30 minutes daily and/or when sitting, a frequency of: - throughout the day Avoid standing for long periods of time. Exercise regularly Non Wound Condition: Other Non Wound Condition Orders/Instructions: - lotion to both legs, 4 layer compression wraps both legs, silver alginate to any weeping areas WOUND #193: - T Second Wound Laterality: Left oe Cleanser: Soap and Water 1 x Per Week/15 Days Discharge Instructions: May shower and wash wound with dial antibacterial soap and water prior to dressing change. Cleanser: Wound Cleanser 1 x Per Week/15 Days Discharge Instructions: Cleanse the wound with wound cleanser prior to applying a clean dressing using gauze sponges, not tissue or cotton balls. Prim Dressing: KerraCel Ag Gelling Fiber Dressing, 2x2 in (silver  alginate) 1 x Per Week/15 Days ary Discharge Instructions: Apply silver alginate to wound bed as instructed Secondary Dressing: Woven Gauze Sponges 2x2 in 1 x Per Week/15 Days Discharge Instructions: Apply over primary dressing as directed. Secured With: Insurance underwriter, Sterile 2x75 (in/in) 1 x Per Week/15 Days Discharge Instructions: Secure with stretch gauze as directed. WOUND #197: - Lower Leg Wound Laterality: Left, Anterior Cleanser: Soap and Water 1 x Per Week/30 Days Discharge Instructions: May shower and wash wound with dial antibacterial soap and water prior to dressing change. Cleanser: Wound Cleanser 1 x Per Week/30 Days Discharge Instructions: Cleanse the wound with wound cleanser prior to applying a clean dressing using gauze sponges, not tissue or cotton balls. Peri-Wound Care: Sween Lotion (Moisturizing lotion) 1 x Per Week/30 Days Discharge Instructions: Apply moisturizing lotion as directed Prim Dressing: KerraCel Ag Gelling Fiber Dressing, 4x5 in (silver alginate) 1 x Per Week/30 Days ary Discharge Instructions: Apply silver alginate to wound bed as instructed Secondary Dressing: Woven Gauze Sponge, Non-Sterile 4x4 in 1 x Per Week/30 Days Discharge Instructions: Apply over primary dressing as directed. Secondary Dressing: ABD Pad, 5x9 1 x Per Week/30 Days Discharge Instructions: Apply over primary dressing as directed. Com pression Wrap: FourPress (4 layer compression wrap) 1 x Per Week/30 Days Discharge Instructions: Apply four layer compression as directed. May also use [place] CoFlex 2 layer compression system as alternative. 1. Would recommend that we going continue with the wound care measures as before and the patient is in agreement with that plan. This includes the use of the silver alginate dressing to any open wound locations. 2. I am also can recommend that we continue with the 4-layer compression wrap that seems to be doing well for him. 3. I  am also can recommend that we have the patient continue with the use of his lymphedema pumps. I am not sure how often he is really using  these but I think he needs to continue to do this regularly. 4. I am also can recommend that he continue with the fluid pills. He really should be doing this twice a day although he only does it once a day otherwise it makes him urinate too much he tells me. We will see patient back for reevaluation in 2 weeks here in the clinic. If anything worsens or changes patient will contact our office for additional recommendations. We will see him in 1 week for nurse visit. Electronic Signature(s) Signed: 05/16/2021 1:39:54 PM By: Lenda Kelp PA-C Entered By: Lenda Kelp on 05/16/2021 13:39:54 -------------------------------------------------------------------------------- SuperBill Details Patient Name: Date of Service: [name], Mackay J. 05/16/2021 Medical Record Number: [id number] Patient Account Number: [id number] Date of Birth/Sex: Treating RN: [birth year] (70 y.o. 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 I87.333 Chronic venous hypertension (idiopathic) with ulcer and inflammation of bilateral lower extremity I89.0 Lymphedema, not elsewhere classified E11.622 Type 2 diabetes mellitus with other skin ulcer E11.40 Type 2 diabetes mellitus with diabetic neuropathy, unspecified Physician Procedures : CPT4 Code Description Modifier [id number] - WC PHYS LEVEL 3 - EST PT ICD-10 Diagnosis Description L97.521  Non-pressure chronic ulcer of other 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 of skin L97.829 Non-pressure chronic ulcer of other part of left  lower leg with unspecified severity L97.519 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

## 2021-05-21 ENCOUNTER — Telehealth: Payer: Self-pay

## 2021-05-21 MED ORDER — RIVAROXABAN 20 MG PO TABS
ORAL_TABLET | ORAL | 3 refills | Status: DC
Start: 2021-05-21 — End: 2022-03-29

## 2021-05-21 NOTE — Telephone Encounter (Signed)
Patient refill meds.   It is going to pharmacy in [place].  He has it filled several times before thru this pharmacy.  rivaroxaban (XARELTO) 20 MG TABS tablet [[id number]]   [facility] Fax # [phone]

## 2021-05-21 NOTE — Telephone Encounter (Signed)
Spoke with pt to clarify pharmacy, he mentioned this has been faxed in the past since pharmacy location in [place]. Previous OV note 04/12/20 mentions pharmacy as well.  Fax confirmation received, pharmacy added to pt's chart for future reference and refills.

## 2021-05-23 ENCOUNTER — Encounter (HOSPITAL_BASED_OUTPATIENT_CLINIC_OR_DEPARTMENT_OTHER): Payer: Medicare Other | Admitting: Physician Assistant

## 2021-05-23 ENCOUNTER — Other Ambulatory Visit: Payer: Self-pay

## 2021-05-23 DIAGNOSIS — I739 Peripheral vascular disease, unspecified: Secondary | ICD-10-CM | POA: Diagnosis not present

## 2021-05-23 DIAGNOSIS — I272 Pulmonary hypertension, unspecified: Secondary | ICD-10-CM | POA: Diagnosis not present

## 2021-05-23 DIAGNOSIS — I87333 Chronic venous hypertension (idiopathic) with ulcer and inflammation of bilateral lower extremity: Secondary | ICD-10-CM | POA: Diagnosis not present

## 2021-05-23 DIAGNOSIS — L03116 Cellulitis of left lower limb: Secondary | ICD-10-CM | POA: Diagnosis not present

## 2021-05-23 DIAGNOSIS — I890 Lymphedema, not elsewhere classified: Secondary | ICD-10-CM | POA: Diagnosis not present

## 2021-05-23 DIAGNOSIS — L97521 Non-pressure chronic ulcer of other 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 of skin: Secondary | ICD-10-CM | POA: Diagnosis not present

## 2021-05-23 DIAGNOSIS — E1151 Type 2 diabetes mellitus with diabetic peripheral angiopathy without gangrene: Secondary | ICD-10-CM | POA: Diagnosis not present

## 2021-05-23 DIAGNOSIS — E11622 Type 2 diabetes mellitus with other skin ulcer: Secondary | ICD-10-CM | POA: Diagnosis not present

## 2021-05-23 DIAGNOSIS — I872 Venous insufficiency (chronic) (peripheral): Secondary | ICD-10-CM | POA: Diagnosis not present

## 2021-05-23 DIAGNOSIS — I509 Heart failure, unspecified: Secondary | ICD-10-CM | POA: Diagnosis not present

## 2021-05-23 DIAGNOSIS — I482 Chronic atrial fibrillation, unspecified: Secondary | ICD-10-CM | POA: Diagnosis not present

## 2021-05-23 DIAGNOSIS — I110 Hypertensive heart disease with heart failure: Secondary | ICD-10-CM | POA: Diagnosis not present
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
## 2021-05-24 NOTE — Progress Notes (Signed)
[name], [name] ([id number]) Visit Report for 05/23/2021 SuperBill Details Patient Name: Date of Service: [name], [name] 05/23/2021 Medical Record Number: [id number] Patient Account Number: [id number] Date of Birth/Sex: Treating RN: 1951/01/06 (70 y.o. 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 Electronic Signature(s) Signed: 05/23/2021 11:33:48 AM By: Shawn Stall Signed: 05/23/2021 5:20:22 PM By: Lenda Kelp PA-C Entered By: Shawn Stall on 05/23/2021 11:33:30

## 2021-05-30 ENCOUNTER — Encounter (HOSPITAL_BASED_OUTPATIENT_CLINIC_OR_DEPARTMENT_OTHER): Payer: Medicare Other | Admitting: Physician Assistant

## 2021-05-30 ENCOUNTER — Other Ambulatory Visit: Payer: Self-pay

## 2021-05-30 DIAGNOSIS — I872 Venous insufficiency (chronic) (peripheral): Secondary | ICD-10-CM | POA: Diagnosis not present

## 2021-05-30 DIAGNOSIS — L97522 Non-pressure chronic ulcer of other part of left foot with fat layer exposed: Secondary | ICD-10-CM | POA: Diagnosis not present

## 2021-05-30 DIAGNOSIS — I87333 Chronic venous hypertension (idiopathic) with ulcer and inflammation of bilateral lower extremity: Secondary | ICD-10-CM | POA: Diagnosis not present

## 2021-05-30 DIAGNOSIS — L03116 Cellulitis of left lower limb: Secondary | ICD-10-CM | POA: Diagnosis not present

## 2021-05-30 DIAGNOSIS — I110 Hypertensive heart disease with heart failure: Secondary | ICD-10-CM | POA: Diagnosis not present

## 2021-05-30 DIAGNOSIS — I509 Heart failure, unspecified: Secondary | ICD-10-CM | POA: Diagnosis not present

## 2021-05-30 DIAGNOSIS — L97829 Non-pressure chronic ulcer of other part of left lower leg with unspecified severity: Secondary | ICD-10-CM | POA: Diagnosis not present

## 2021-05-30 DIAGNOSIS — E1151 Type 2 diabetes mellitus with diabetic peripheral angiopathy without gangrene: Secondary | ICD-10-CM | POA: Diagnosis not present

## 2021-05-30 DIAGNOSIS — I482 Chronic atrial fibrillation, unspecified: Secondary | ICD-10-CM | POA: Diagnosis not present

## 2021-05-30 DIAGNOSIS — I890 Lymphedema, not elsewhere classified: Secondary | ICD-10-CM | POA: Diagnosis not present

## 2021-05-30 DIAGNOSIS — I272 Pulmonary hypertension, unspecified: Secondary | ICD-10-CM | POA: Diagnosis not present

## 2021-05-30 DIAGNOSIS — L97521 Non-pressure chronic ulcer of other 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 of skin: Secondary | ICD-10-CM | POA: Diagnosis not present

## 2021-05-30 DIAGNOSIS — E11622 Type 2 diabetes mellitus with other skin ulcer: Secondary | ICD-10-CM | POA: Diagnosis not present

## 2021-05-30 DIAGNOSIS — I739 Peripheral vascular disease, unspecified: Secondary | ICD-10-CM | POA: Diagnosis not present

## 2021-05-30 NOTE — Progress Notes (Addendum)
SURAFEL, HILLEARY ([id number]) Visit Report for 05/30/2021 Chief Complaint Document Details Patient Name: Date of Service: [name], [name] 05/30/2021 10:30 A M Medical Record Number: [id number] Patient Account Number: [id number] Date of Birth/Sex: Treating RN: [birth year] (70 y.o. [name] Primary Care Provider: Nicoletta Ba Other Clinician: Referring Provider: Treating Provider/Extender: Adele Dan in Treatment: 279 Information Obtained from: Patient Chief Complaint patient is here for evaluation of venous/lymphedema ulcers Electronic Signature(s) Signed: 05/30/2021 10:59:15 AM By: Lenda Kelp PA-C Entered By: Lenda Kelp on 05/30/2021 10:59:15 -------------------------------------------------------------------------------- HPI Details Patient Name: Date of Service: [name], [name] 05/30/2021 10:30 A M Medical Record Number: [id number] Patient Account Number: [id number] Date of Birth/Sex: Treating RN: [birth year] (70 y.o. [name] Primary Care Provider: Nicoletta Ba Other Clinician: Referring Provider: Treating Provider/Extender: Adele Dan in Treatment: 279 History of Present Illness HPI Description: Referred by PCP for consultation. Patient has long standing history of BLE venous stasis, no prior ulcerations. At beginning of month, developed cellulitis and weeping. Received IM Rocephin followed by Keflex and resolved. Wears compression stocking, appr 6 months old. Not sure strength. No present drainage. 01/22/16 this is a patient who is a type II diabetic on insulin. 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 He tells me he religiously wears pressure stockings of uncertain strength. He was here with weeping edema about 8 months ago but did not have an open wound. Roughly a month ago he had a reopening on his bilateral legs. He is been using bandages and Neosporin. He  does not complain of pain. 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 Last BUN/creatinine I have is from 11/20/15 at 13 and 1.0 respectively 01/29/16; patient arrives today having tolerated the Profore wrap. He brought in his stockings and these are 18 mmHg stockings he bought from [employer]. The compression here is likely inadequate. He does not complain of pain or excessive drainage she has no systemic symptoms. The wound on the right looks improved as does the one on the left although one on the left is more substantial with still tissue at risk below the actual wound area on the bilateral posterior calf 02/05/16; patient arrives with poor edema control. He states that we did put a 4 layer compression on it last week. No weight appear 5 this. 02/12/16; the area on the posterior right Has healed. The left Has a substantial wound that has necrotic surface eschar that requires a debridement with a curette. 02/16/16;the patient called or a Nurse visit secondary to increased swelling. He had been in earlier in the week with his right leg healed. He was transitioned to is on pressure stocking on the right leg with the only open wound on the left, a substantial area on the left posterior calf. Note he has a history of severe lower extremity edema, he has a history of chronic atrial fibrillation but not heart failure per my notes but I'll need to research this. He is not complaining of chest pain shortness of breath or orthopnea. The intake nurse noted blisters on the previously closed right leg 02/19/16; this is the patient's regular visit day. I see him on Friday with escalating edema new wounds on the right leg and clear signs of at least right ventricular heart failure. I increased his Lasix to 40 twice a  day. He is returning currently in follow-up. States he is noticed a decrease in that the  edema 02/26/16 patient's legs have much less edema. There is nothing really open on the right leg. The left leg has improved condition of the large superficial wound on the posterior left leg 03/04/16; edema control is very much better. The patient's right leg wounds have healed. On the left leg he continues to have severe venous inflammation on the posterior aspect of the left leg. There is no tenderness and I don't think any of this is cellulitis. 03/11/16; patient's right leg is married healed and he is in his own stocking. The patient's left leg has deteriorated somewhat. There is a lot of erythema around the wound on the posterior left leg. There is also a significant rim of erythema posteriorly just above where the wrap would've ended there is a new wound in this location and a lot of tenderness. Can't rule out cellulitis in this area. 03/15/16; patient's right leg remains healed and he is in his own stocking. The patient's left leg is much better than last review. His major wound on the posterior aspect of his left Is almost fully epithelialized. He has 3 small injuries from the wraps. Really. Erythema seems a lot better on antibiotics 03/18/16; right leg remains healed and he is in his own stocking. The patient's left leg is much better. The area on the posterior aspect of the left calf is fully epithelialized. His 3 small injuries which were wrap injuries on the left are improved only one seems still open his erythema has resolved 03/25/16; patient's right leg remains healed and he is in his own stocking. There is no open area today on the left leg posterior leg is completely closed up. His wrap injuries at the superior aspect of his leg are also resolved. He looks as though he has some irritation on the dorsal ankle but this is fully epithelialized without evidence of infection. 03/28/16; we discharged this patient on Monday. Transitioned him into his own stocking. There were problems almost  immediately with uncontrolled swelling weeping edema multiple some of which have opened. He does not feel systemically unwell in particular no chest pain no shortness of breath and he does not feel 04/08/16; the edema is under better control with the Profore light wrap but he still has pitting edema. There is one large wound anteriorly 2 on the medial aspect of his left leg and 3 small areas on the superior posterior calf. Drainage is not excessive he is tolerating a Profore light well 04/15/16; put a Profore wrap on him last week. This is controlled is edema however he had a lot of pain on his left anterior foot most of his wounds are healed 04/22/16 once again the patient has denuded areas on the left anterior foot which he states are because his wrap slips up word. He saw his primary physician today is on Lasix 40 twice a day and states that he his weight is down 20 pounds over the last 3 months. 04/29/16: Much improved. left anterior foot much improved. He is now on Lasix 80 mg per day. Much improved edema control 05/06/16; I was hoping to be able to discharge him today however once again he has blisters at a low level of where the compression was placed last week mostly on his left lateral but also his left medial leg and a small area on the anterior part of the left foot. 05/09/16; apparently the patient  went home after his appointment on 7/4 later in the evening developing pain in his upper medial thigh together with subjective fever and chills although his temperature was not taken. The pain was so intense he felt he would probably have to call 911. However he then remembered that he had leftover doxycycline from a previous round of antibiotics and took these. By the next morning he felt a lot better. He called and spoke to one of our nurses and I approved doxycycline over the phone thinking that this was in relation to the wounds we had previously seen although they were definitely were not. The  patient feels a lot better old fever no chills he is still working. Blood sugars are reasonably controlled 05/13/16; patient is back in for review of his cellulitis on his anterior medial upper thigh. He is taking doxycycline this is a lot better. Culture I did of the nodular area on the dorsal aspect of his foot grew MRSA this also looks a lot better. 05/20/16; the patient is cellulitis on the medial upper thigh has resolved. All of his wound areas including the left anterior foot, areas on the medial aspect of the left calf and the lateral aspect of the calf at all resolved. He has a new blister on the left dorsal foot at the level of the fourth toe this was excised. No evidence of infection 05/27/16; patient continues to complain weeping edema. He has new blisterlike wounds on the left anterior lateral and posterior lateral calf at the top of his wrap levels. The area on his left anterior foot appears better. He is not complaining of fever, pain or pruritus in his feet. 05/30/16; the patient's blisters on his left anterior leg posterior calf all look improved. He did not increase the Lasix 100 mg as I suggested because he was going to run out of his 40 mg tablets. He is still having weeping edema of his toes 06/03/16; I renewed his Lasix at 80 mg once a day as he was about to run out when I last saw him. He is on 80 mg of Lasix now. I have asked him to cut down on the excessive amount of water he was drinking and asked him to drink according to his thirst mechanisms 06/12/2016 -- was seen 2 days ago and was supposed to wear his compression stockings at home but he is developed lymphedema and superficial blisters on the left lower extremity and hence came in for a review 06/24/16; the remaining wound is on his left anterior leg. He still has edema coming from between his toes. There is lymphedema here however his edema is generally better than when I last saw this. He has a history of atrial fibrillation  but does not have a known history of congestive heart failure nevertheless I think he probably has this at least on a diastolic basis. 07/01/16 I reviewed his echocardiogram from January 2017. This was essentially normal. He did not have LVH, EF of 55-60%. His right ventricular function was normal although he did have trivial tricuspid and pulmonic regurgitation. This is not audible on exam however. I increased his Lasix to do massive edema in his legs well above his knees I think in early July. He was also drinking an excessive amount of water at the time. 07/15/16; missed his appointment last week because of the Labor Day holiday on Monday. He could not get another appointment later in the week. Started to feel the wrap digging in superiorly  so we remove the top half and the bottom half of his wrap. He has extensive erythema and blistering superiorly in the left leg. Very tender. Very swollen. Edema in his foot with leaking edema fluid. He has not been systemically unwell 07/22/16; the area on the left leg laterally required some debridement. The medial wounds look more stable. His wrap injury wounds appear to have healed. Edema and his foot is better, weeping edema is also better. He tells me he is meeting with the supplier of the external compression pumps at work 08/05/16; [place] His wrap is been on for an extended period of time. Also over the weekend he developed an extensive area of tender erythema across his anterior medial thigh. He took to doxycycline yesterday that he had leftover from a previous prescription. The patient complains of weeping edema coming out of his toes 08/08/16; I saw this patient on 10/2. He was tender across his anterior thigh. I put him on doxycycline. He returns today in follow-up. He does not have any open wounds on his lower leg, he still has edema weeping into his toes. 08/12/16; patient was seen back urgently today to  follow-up for his extensive left thigh cellulitis/erysipelas. He comes back with a lot less swelling and erythema pain is much better. I believe I gave him Augmentin and Cipro. His wrap was cut down as he stated a roll down his legs. He developed blistering above the level of the wrap that remained. He has 2 open blisters and 1 intact. 08/19/16; patient is been doing his primary doctor who is increased his Lasix from 40-80 once a day or 80 already has less edema. Cellulitis has remained improved in the left thigh. 2 open areas on the posterior left calf 08/26/16; he returns today having new open blisters on the anterior part of his left leg. He has his compression pumps but is not yet been shown how to use some vital representative from the supplier. 09/02/16 patient returns today with no open wounds on the left leg. Some maceration in his plantar toes 09/10/2016 -- Dr. Leanord Hawkingobson had recently discharged him on 09/02/2016 and he has come right back with redness swelling and some open ulcers on his left lower extremity. He says this was caused by trying to apply his compression stockings and he's been unable to use this and has not been able to use his lymphedema pumps. He had some doxycycline leftover and he has started on this a few days ago. 09/16/16; there are no open wounds on his leg on the left and no evidence of cellulitis. 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 He does not complain of symptoms here. I am not clear use using his external compression pumps. 09/23/16; I have not seen this patient in 2 weeks. He canceled his appointment 10 days ago as he was going on vacation. He tells me that on Monday he noticed a large area on his posterior left leg which is been draining copiously and is reopened into a large wound. He is been using ABDs and the external part of his juxtalite, according to our nurse this was not on properly. 10/07/16;  Still a substantial area on the posterior left leg. Using silver alginate 10/14/16; in general better although there is still open area which looks healthy. Still using silver alginate. He reminds me that this happen [place] T oday while he was showering in the morning. He had been using his juxtalite's 10/21/16; the area on his posterior left leg is fully epithelialized. However he arrives today with a large area of tender erythema in his medial and posterior left thigh just above the knee. I have marked the area. Once again he is reluctant to consider hospitalization. I treated him with oral antibiotics in the past for a similar situation with resolution I think with doxycycline however this area it seems more extensive to me. He is not complaining of fever but does have chills and says states he is thirsty. His blood sugar today was in the 140s at home 10/25/16 the area on his posterior left leg is fully epithelialized although there is still some weeping edema. The large area of tenderness and erythema in his medial and posterior left thigh is a lot less tender although there is still a lot of swelling in this thigh. He states he feels a lot better. He is on doxycycline and Augmentin that I started last week. This will continued until Tuesday, December 26. I have ordered a duplex ultrasound of the left thigh rule out DVT whether there is an abscess something that would need to be drained I would also like to know. 11/01/16; he still has weeping edema from a not fully epithelialized area on his left posterior calf. Most of the rest of this looks a lot better. He has completed his antibiotics. His thigh is a lot better. Duplex ultrasound did not show a DVT in the thigh 11/08/16; he comes in today with more Denuded surface epithelium from the posterior aspect of his calf. There is no real evidence of cellulitis. The superior aspect of his wrap appears to have put quite an  indentation in his leg just below the knee and this may have contributed. He does not complain of pain or fever. We have been using silver alginate as the primary dressing. The area of cellulitis in the right thigh has totally resolved. He has been using his compression stockings once a week 11/15/16; the patient arrives today with more loss of epithelium from the posterior aspect of his left calf. He now has a fairly substantial wound in this area. The reason behind this deterioration isn't exactly clear although his edema is not well controlled. He states he feels he is generally more swollen systemically. He is not complaining of chest pain shortness of breath fever. T me he has an appointment with his primary physician in early February. He is on 80 mg of oral ells Lasix a day. He claims compliance with the external compression pumps. He is not having any pain in his legs similar to what he has with his recurrent cellulitis 11/22/16; the patient arrives a follow-up of his large area on his left lateral calf. This looks somewhat better today. He came in earlier in the week for a dressing change since I saw him a week ago. He is not complaining of any pain no shortness of breath no chest pain 11/28/16; the patient arrives for follow-up of his large area on the left lateral calf this does not look better. In fact it is larger weeping edema. The surface of the wound does not look too bad. We have been using silver alginate although I'm not certain that this is a dressing issue. 12/05/16; again the patient follows up for a large wound on the left lateral and left posterior calf this does not look better. There  continues to be weeping edema necrotic surface tissue. More worrisome than this once again there is erythema below the wound involving the distal Achilles and heel suggestive of cellulitis. He is on his feet working most of the day of this is not going well. We are changing his dressing twice a week to  facilitate the drainage. 12/12/16; not much change in the overall dimensions of the large area on the left posterior calf. This is very inflamed looking. I gave him an. Doxycycline last week does not really seem to have helped. He found the wrap very painful indeed it seems to of dog into his legs superiorly and perhaps around the heel. He came in early today because the drainage had soaked through his dressings. 12/19/16- patient arrives for follow-up evaluation of his left lower extremity ulcers. He states that he is using his lymphedema pumps once daily when there is "no drainage". He admits to not using his lipedema pumps while under current treatment. His blood sugars have been consistently between 150-200. 12/26/16; the patient is not using his compression pumps at home because of the wetness on his feet. I've advised him that I think it's important for him to use this daily. He finds his feet too wet, he can put a plastic bag over his legs while he is in the pumps. Otherwise I think will be in a vicious circle. We are using silver alginate to the major area on his left posterior calf 01/02/17; the patient's posterior left leg has further of all into 3 open wounds. All of them covered with a necrotic surface. He claims to be using his compression pumps once a day. His edema control is marginal. Continue with silver alginate 01/10/17; the patient's left posterior leg actually looks somewhat better. There is less edema, less erythema. Still has 3 open areas covered with a necrotic surface requiring debridement. He claims to be using his compression pumps once a day his edema control is better 01/17/17; the patient's left posterior calf look better last week when I saw him and his wrap was changed 2 days ago. He has noted increasing pain in the left heel and arrives today with much larger wounds extensive erythema extending down into the entire heel area especially tender medially. He is not  systemically unwell CBGs have been controlled no fever. Our intake nurse showed me limegreen drainage on his AVD pads. 01/24/17; his usual this patient responds nicely to antibiotics last week giving him Levaquin for presumed Pseudomonas. The whole entire posterior part of his leg is much better much less inflamed and in the case of his Achilles heel area much less tender. He has also had some epithelialization posteriorly there are still open areas here and still draining but overall considerably better 01/31/17- He has continue to tolerate the compression wraps. he states that he continues to use the lymphedema pumps daily, and can increase to twice daily on the weekends. He is voicing no complaints or concerns regarding his LLE ulcers 02/07/17-he is here for follow-up evaluation. He states that he noted some erythema to the left medial and anterior thigh, which he states is new as of yesterday. He is concerned about recurrent cellulitis. He states his blood sugars have been slightly elevated, this morning in the 180s 02/14/17; he is here for follow-up evaluation. When he was last here there was erythema superiorly from his posterior wound in his anterior thigh. He was prescribed Levaquin however a culture of the wound surface grew MRSA over  the phone I changed him to doxycycline on Monday and things seem to be a lot better. 02/24/17; patient missed his appointment on Friday therefore we changed his nurse visit into a physician visit today. Still using silver alginate on the large area of the posterior left thigh. He isn't new area on the dorsal left second toe 03/03/17; actually better today although he admits he has not used his external compression pumps in the last 2 days or so because of work responsibilities over the weekend. 03/10/17; continued improvement. External compression pumps once a day almost all of his wounds have closed on the posterior left calf. Better edema control 03/17/17; in general  improved. He still has 3 small open areas on the lateral aspect of his left leg however most of the area on the posterior part of his leg is epithelialized. He has better edema control. He has an ABD pad under his stocking on the right anterior lower leg although he did not let us look at that today. 03/24/17; patient arrives back in clinic today with no open areas however there are areas on the posterior left calf and anterior left calf that are less than 100% epithelialized. His edema is well controlled in the left lower leg. There is some pitting edema probably lymphedema in the left upper thigh. He uses compression pumps at home once per day. I tried to get him to do this twice a day although he is very reticent. 04/01/2017 -- for the last 2 days he's had significant redness, tenderness and weeping and came in for an urgent visit today. 04/07/17; patient still has 6 more days of doxycycline. He was seen by Dr. Meyer Russel last Wednesday for cellulitis involving the posterior aspect, lateral aspect of his Involving his heel. For the most part he is better there is less erythema and less weeping. He has been on his feet for 12 hours 2 over the weekend. Using his compression pumps once a day 04/14/17 arrives today with continued improvement. Only one area on the posterior left calf that is not fully epithelialized. He has intense bilateral venous inflammation associated with his chronic venous insufficiency disease and secondary lymphedema. We have been using silver alginate to the left posterior calf wound In passing he tells [place] today that the right leg but we have not seen in quite some time has an open area on it but he doesn't want [place] to look at this today states he will show this to [place] next week. 04/21/17; there is no open area on his left leg although he still reports some weeping edema. He showed [place] his right leg today which is the first time we've seen this leg in a long time. He has a large area of  open wound on the right leg anteriorly healthy granulation. Quite a bit of swelling in the right leg and some degree of venous inflammation. He told us about the right leg in passing last week but states that deterioration in the right leg really only happened over the weekend 04/28/17; there is no open area on the left leg although there is an irritated part on the posterior which is like a wrap injury. The wound on the right leg which was new from last week at least to [place] is a lot better. 05/05/17; still no open area on the left leg. Patient is using his new compression stocking which seems to be doing a good job of controlling the edema. He states he is using his  compression pumps once per day. The right leg still has an open wound although it is better in terms of surface area. Required debridement. A lot of pain in the posterior right Achilles marked tenderness. Usually this type of presentation this patient gives concern for an active cellulitis 05/12/17; patient arrives today with his major wound from last week on the right lateral leg somewhat better. Still requiring debridement. He was using his compression stocking on the left leg however that is reopened with superficial wounds anteriorly he did not have an open wound on this leg previously. He is still using his juxta light's once daily at night. He cannot find the time to do this in the morning as he has to be at work by 7 AM 05/19/17; right lateral leg wound looks improved. No debridement required. The concerning area is on the left posterior leg which appears to almost have a subcutaneous hemorrhagic component to it. We've been using silver alginate to all the wounds 05/26/17; the right lateral leg wound continues to look improved. However the area on the left posterior calf is a tightly adherent surface. [place] using silver alginate. Because of the weeping edema in his legs there is very little good alternatives. 06/02/17; the patient left  here last week looking quite good. Major wound on the left posterior calf and a small one on the right lateral calf. Both of these look satisfactory. He tells me that by Wednesday he had noted increased pain in the left leg and drainage. He called on Thursday and Friday to get an appointment here but we were blocked. He did not go to urgent care or his primary physician. He thinks he had a fever on Thursday but did not actually take his temperature. He has not been using his compression pumps on the left leg because of pain. I advised him to go to the emergency room today for IV antibiotics for stents of left leg cellulitis but he has refused I have asked him to take 2 days off work to keep his leg elevated and he has refused this as well. In view of this I'm going to call him and Augmentin and doxycycline. 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 However after a detailed discussion regarding the risk benefits and alternatives he refuses to get admitted to the hospital. With no other recourse I will continue him on oral antibiotics as before and hopefully he'll have his infectious disease consultation this week. 06/16/2017 -- the patient was seen today by the nurse practitioner at infectious disease Ms. Dixon. 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 He has also been advise Lotrimin cream locally. she also had wise IV antibiotics for his condition if it flares up 06/23/17; patient arrives today with drainage bilaterally although the remaining wound on the left posterior calf after  cleaning up today "highlighter yellow drainage" did not look too bad. Unfortunately he has had breakdown on the right anterior leg [previously this leg had not been open and he is using a black stocking] he went to see infectious  disease and is been put on clindamycin 150 mg daily, I did not verify the dose although I'm not familiar with using clindamycin in this dosing range, perhaps for prophylaxisoo 06/27/17; I brought this patient back today to follow-up on the wound deterioration on the right lower leg together with surrounding cellulitis. I started him on doxycycline 4 days ago. This area looks better however he comes in today with intense cellulitis on the medial part of his left thigh. This is not have a wound in this area. Extremely tender. We've been using silver alginate to the wounds on the right lower leg left lower leg with bilateral 4 layer compression he is using his external compression pumps once a day 07/04/17; patient's left medial thigh cellulitis looks better. He has not been using his compression pumps as his insert said it was contraindicated with cellulitis. His right leg continues to make improvements all the wounds are still open. We only have one remaining wound on the left posterior calf. Using silver alginate to all open areas. He is on doxycycline which I started a week ago and should be finishing I gave him Augmentin after Thursday's visit for the severe cellulitis on the left medial thigh which fortunately looks better 07/14/17; the patient's left medial thigh cellulitis has resolved. The cellulitis in his right lower calf on the right also looks better. All of his wounds are stable to improved we've been using silver alginate he has completed the antibiotics I have given him. He has clindamycin 150 mg once a day prescribed by infectious disease for prophylaxis, I've advised him to start this now. We have been using bilateral Unna boots over silver alginate to the wound  areas 07/21/17; the patient is been to see infectious disease who noted his recurrent problems with cellulitis. He was not able to tolerate prophylactic clindamycin therefore he is on amoxicillin 500 twice a day. He also had a second daily dose of Lasix added By Dr. Oneta Rack but he is not taking this. Nor is he being completely compliant with his compression pumps a especially not this week. He has 2 remaining wounds one on the right posterior lateral lower leg and one on the left posterior medial lower leg. 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 The patient has Unna boots bilaterally. 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 He has bilateral Unna boots for compression and silver alginate to his wounds. Arrives today with his legs looking as good as I have seen him in quite some time. Not surprisingly his wounds look better as well with improvement on the right lateral leg venous insufficiency wound and also the left medial leg. He is still using the compression pumps once a day 08/11/17; both legs appear to be doing better wounds on the right lateral and left medial legs look better. Skin on the right leg quite good. He is been using silver alginate as the primary dressing. I'm going to use Anasept gel calcium alginate and maintain all the secondary dressings 08/18/17; the patient continues to actually do quite well. The area on his right lateral leg is just about closed the left medial also looks better although it is still moist in this area. His edema is well controlled we have been using Anasept gel with calcium alginate  and the usual secondary dressings, 4 layer compression and once daily use of his compression pumps "always been able to manage 09/01/17; the patient  continues to do reasonably well in spite of his trip to [place]. The area on the right lateral leg is epithelialized. Left is much better but still open. He has more edema and more chronic erythema on the left leg [venous inflammation] 09/08/17; he arrives today with no open wound on the right lateral leg and decently controlled edema. Unfortunately his left leg is not nearly as in his good situation as last week.he apparently had increasing edema starting on Saturday. He edema soaked through into his foot so used a plastic bag to walk around his home. The area on the medial right leg which was his open area is about the same however he has lost surface epithelium on the left lateral which is new and he has significant pain in the Achilles area of the left foot. He is already on amoxicillin chronically for prophylaxis of cellulitis in the left leg 09/15/17; he is completed a week of doxycycline and the cellulitis in the left posterior leg and Achilles area is as usual improved. He still has a lot of edema and fluid soaking through his dressings. There is no open wound on the right leg. He saw infectious disease NP today 09/22/17;As usual 1 we transition him from our compression wraps to his stockings things did not go well. He has several small open areas on the right leg. He states this was caused by the compression wrap on his skin although he did not wear this with the stockings over them. He has several superficial areas on the left leg medially laterally posteriorly. He does not have any evidence of active cellulitis especially involving the left Achilles The patient is traveling from [name] Saturday going to [facility]. He states he isn't attempting to get an appointment with a heel objects wound [name] there to change his dressings. I am not completely certain whether this will work 10/06/17; the patient came in on Friday for a nurse visit and the nurse reported that his legs actually look  quite good. He arrives in clinic today for his regular follow-up visit. He has a new wound on his left third toe over the PIP probably caused by friction with his footwear. He has small areas on the left leg and a very superficial but epithelialized area on the right anterior lateral lower leg. Other than that his legs look as good as I've seen him in quite some time. We have been using silver alginate Review of systems; no chest pain no shortness of breath other than this a 10 point review of systems negative 10/20/17; seen by Dr. Meyer Russel last week. He had taken some antibiotics [doxycycline] that he had left over. Dr. Meyer Russel thought he had candida infection and declined to give him further antibiotics. He has a small wound remaining on the right lateral leg several areas on the left leg including a larger area on the left posterior several left medial and anterior and a small wound on the left lateral. The area on the left dorsal third toe looks a lot better. ROS; Gen.; no fever, respiratory no cough no sputum Cardiac no chest pain other than this 10 point review of system is negative 10/30/17; patient arrives today having fallen in the bathtub 3 days ago. It took him a while to get up. He has pain and maceration in the wounds on his  left leg which have deteriorated. He has not been using his pumps he also has some maceration on the right lateral leg. 11/03/17; patient continues to have weeping edema especially in the left leg. This saturates his dressings which were just put on on 12/27. As usual the doxycycline seems to take care of the cellulitis on his lower leg. He is not complaining of fever, chills, or other systemic symptoms. He states his leg feels a lot better on the doxycycline I gave him empirically. He also apparently gets injections at his primary doctor's officeo Rocephin for cellulitis prophylaxis. I didn't ask him about his compression pump compliance today I think that's probably  marginal. Arrives in the clinic with all of his dressings primary and secondary macerated full of fluid and he has bilateral edema 11/10/17; the patient's right leg looks some better although there is still a cluster of wounds on the right lateral. The left leg is inflamed with almost circumferential skin loss medially to laterally although we are still maintaining anteriorly. He does not have overt cellulitis there is a lot of drainage. He is not using compression pumps. We have been using silver alginate to the wound areas, there are not a lot of options here 11/17/17; the patient's right leg continues to be stable although there is still open wounds, better than last week. The inflammation in the left leg is better. Still loss of surface layer epithelium especially posteriorly. There is no overt cellulitis in the amount of edema and his left leg is really quite good, tells me he is using his compression pumps once a day. 11/24/17; patient's right leg has a small superficial wound laterally this continues to improve. The inflammation in the left leg is still improving however we have continuous surface layer epithelial loss posteriorly. There is no overt cellulitis in the amount of edema in both legs is really quite good. He states he is using his compression pumps on the left leg once a day for 5 out of 7 days 12/01/17; very small superficial areas on the right lateral leg continue to improve. Edema control in both legs is better today. He has continued loss of surface epithelialization and left posterior calf although I think this is better. We have been using silver alginate with large number of absorptive secondary dressings 4 layer on the left Unna boot on the right at his request. He tells me he is using his compression pumps once a day 12/08/17; he has no open area on the right leg is edema control is good here. On the left leg however he has marked erythema and tenderness breakdown of skin. He has  what appears to be a wrap injury just distal to the popliteal fossa. 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 There was swelling erythema and breakdown in the right leg. He has much worse skin breakdown in the left leg as well multiple open areas medially and posteriorly as well as laterally. He tells me he has been using his compression pumps but tells me he feels that the drainage out of his leg is worse when he uses a compression pumps. T be fair to him he is been saying this o for a while however I  don't know that I have really been listening to this. I wonder if the compression pumps are working properly 12/22/17;. Once again he arrives with severe erythema, weeping edema from the left greater than right leg. Noncompliance with compression pumps. New this visit he is complaining of pain on the lateral aspect of the right leg and the medial aspect of his right thigh. He apparently saw his cardiologist Dr. [name] who was ordered an echocardiogram area and I think this is a step in the right direction 12/25/17; started his doxycycline Monday night. There is still intense erythema of the right leg especially in the anterior thigh although there is less tenderness. The erythema around the wound on the right lateral calf also is less tender. He still complaining of pain in the left heel. His wounds are about the same right lateral left medial left lateral. Superficial but certainly not close to closure. He denies being systemically unwell no fever chills no abdominal pain no diarrhea 12/29/17; back in follow-up of his extensive right calf and right thigh cellulitis. I added amoxicillin to cover possible doxycycline resistant  strep. This seems to of done the trick he is in much less pain there is much less erythema and swelling. He has his echocardiogram at 11:00 this morning. X-ray of the left heel was also negative. 01/05/18; the patient arrived with his edema under much better control. Now that he is retired he is able to use his compression pumps daily and sometimes twice a day per the patient. He has a wound on the right leg the lateral wound looks better. Area on the left leg also looks a lot better. He has no evidence of cellulitis in his bilateral thighs I had a quick peak at his echocardiogram. He is in normal ejection fraction and normal left ventricular function. He has moderate pulmonary hypertension moderately reduced right ventricular function. One would have to wonder about chronic sleep apnea although he says he doesn't snore. He'll review the echocardiogram with his cardiologist. 01/12/18; the patient arrives with the edema in both legs under exemplary control. He is using his compression pumps daily and sometimes twice daily. His wound on the right lateral leg is just about closed. He still has some weeping areas on the posterior left calf and lateral left calf although everything is just about closed here as well. I have spoken with Aldean Baker who is the patient's nurse practitioner and infectious disease. She was concerned that the patient had not understood that the parenteral penicillin injections he was receiving for cellulitis prophylaxis was actually benefiting him. I don't think the patient actually saw that I would tend to agree we were certainly dealing with less infections although he had a serious one last month. 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 He is healed. He'll be placed in juxtalite compression wraps and increase his lymphedema pumps to twice daily. We will follow up again next week to ensure there are no issues with the new  regiment. 01/20/18-he is here for evaluation of bilateral lower extremity weeping edema. Yesterday he was placed in compression wrap to the right lower extremity and compression stocking to left lower shrubbery. He states he uses lymphedema pumps last night and again this morning and noted a blister to the left lower extremity. On exam he was noted to have drainage to the right lower extremity. He will be placed in Unna boots bilaterally and follow-up next week 01/26/18; patient was actually discharged a  week ago to his own juxta light stockings only to return the next day with bilateral lower extremity weeping edema.he was placed in bilateral Unna boots. He arrives today with pain in the back of his left leg. There is no open area on the right leg however there is a linear/wrap injury on the left leg and weeping edema on the left leg posteriorly. I spoke with infectious disease about 10 days ago. They were disappointed that the patient elected to discontinue prophylactic intramuscular penicillin shots as they felt it was particularly beneficial in reducing the frequency of his cellulitis. I discussed this with the patient today. He does not share this view. He'll definitely need antibiotics today. Finally he is traveling to [place] and trauma leaving this Saturday and returning a week later and he does not travel with his pumps. He is going by car 01/30/18; patient was seen 4 days ago and brought back in today for review of cellulitis in the left leg posteriorly. I put him on amoxicillin this really hasn't helped as much as I might like. He is also worried because he is traveling to [name] [place] by car. Finally we will be rewrapping him. There is no open area on the right leg over his left leg has multiple weeping areas as usual 02/09/18; The same wrap on for 10 days. He did not pick up the last doxycycline I prescribed for him. He apparently took 4 days worth he already had. There is nothing open on  his right leg and the edema control is really quite good. He's had damage in the left leg medially and laterally especially probably related to the prolonged use of Unna boots 02/12/18; the patient arrived in clinic today for a nurse visit/wrap change. He complained of a lot of pain in the left posterior calf. He is taking doxycycline that I previously prescribed for him. Unfortunately even though he used his stockings and apparently used to compression pumps twice a day he has weeping edema coming out of the lateral part of his right leg. This is coming from the lower anterior lateral skin area. 02/16/18; the patient has finished his doxycycline and will finish the amoxicillin 2 days. The area of cellulitis in the left calf posteriorly has resolved. He is no longer having any pain. He tells me he is using his compression pumps at least once a day sometimes twice. 02/23/18; the patient finished his doxycycline and Amoxil last week. On Friday he noticed a small erythematous circle about the size of a quarter on the left lower leg just above his ankle. This rapidly expanded and he now has erythema on the lateral and posterior part of the thigh. This is bright red. Also has an area on the dorsal foot just above his toes and a tender area just below the left popliteal fossa. He came off his prophylactic penicillin injections at his own insistence one or 2 months ago. This is obviously deteriorated since then 03/02/18; patient is on doxycycline and Amoxil. Culture I did last week of the weeping area on the back of his left calf grew group B strep. I have therefore renewed the amoxicillin 500 3 times a day for a further week. He has not been systemically unwell. Still complaining of an area of discomfort right under his left popliteal fossa. There is no open wound on the right leg. He tells me that he is using his pumps twice a day on most days 03/09/18; patient arrives in clinic today completing  his amoxicillin  today. The cellulitis on his left leg is better. Furthermore he tells me that he had intramuscular penicillin shots that his primary care office today. However he also states that the wrap on his right leg fell down shortly after leaving clinic last week. He developed a large blister that was present when he came in for a nurse visit later in the week and then he developed intense discomfort around this area.He tells me he is using his compression pumps 03/16/18; the patient has completed his doxycycline. The infectious part of this/cellulitis in the left heel area left popliteal area is a lot better. He has 2 open areas on the right calf. Still areas on the left calf but this is a lot better as well. 03/24/18; the patient arrives complaining of pain in the left popliteal area again. He thinks some of this is wrap injury. He has no open area on the right leg and really no open area on the left calf either except for the popliteal area. He claims to be compliant with the compression pumps 03/31/18; I gave him doxycycline last week because of cellulitis in the left popliteal area. This is a lot better although the surface epithelium is denuded off and response to this. He arrives today with uncontrolled edema in the right calf area as well as a fingernail injury in the right lateral calf. There is only a few open areas on the left 04/06/18; I gave him amoxicillin doxycycline over the last 2 weeks that the amoxicillin should be completing currently. He is not complaining of any pain or systemic symptoms. The only open areas see has is on the right lateral lower leg paradoxically I cannot see anything on the left lower leg. He tells me he is using his compression pumps twice a day on most days. Silver alginate to the wounds that are open under 4 layer compression 04/13/18; he completed antibiotics and has no new complaints. Using his compression pumps. Silver alginate that anything that's opened 04/20/18; he is  using his compression pumps religiously. Silver alginate 4 layer compression anything that's opened. He comes in today with no open wounds on the left leg but 3 on the right including a new one posteriorly. He has 2 on the right lateral and one on the right posterior. He likes Unna boots on the right leg for reasons that aren't really clear we had the usual 4 layer compression on the left. It may be necessary to move to the 4 layer compression on the right however for now I left them in the Unna boots 04/27/18; he is using his compression pumps at least once a day. He has still the wounds on the right lateral calf. The area right posteriorly has closed. He does not have an open wound on the left under 4 layer compression however on the dorsal left foot just proximal to the toes and the left third toe 2 small open areas were identified 05/11/18; he has not uses compression pumps. The areas on the right lateral calf have coalesced into one large wound necrotic surface. On the left side he has one small wound anteriorly however the edema is now weeping out of a large part of his left leg. He says he wasn't using his pumps because of the weeping fluid. I explained to him that this is the time he needs to pump more 05/18/18; patient states he is using his compression pumps twice a day. The area on the right lateral  large wound albeit superficial. On the left side he has innumerable number of small new wounds on the left calf particularly laterally but several anteriorly and medially. All these appear to have healthy granulated base these look like the remnants of blisters however they occurred under compression. The patient arrives in clinic today with his legs somewhat better. There is certainly less edema, less multiple open areas on the left calf and the right anterior leg looks somewhat better as well superficial and a little smaller. However he relates pain and erythema over the last 3-4 days in the thigh  and I looked at this today. He has not been systemically unwell no fever no chills no change in blood sugar values 05/25/18; comes in today in a better state. The severe cellulitis on his left leg seems better with the Keflex. Not as tender. He has not been systemically unwell Hard to find an open wound on the left lower leg using his compression pumps twice a day The confluent wounds on his right lateral calf somewhat better looking. These will ultimately need debridement I didn't do this today. 06/01/18; the severe cellulitis on the left anterior thigh has resolved and he is completed his Keflex. There is no open wound on the left leg however there is a superficial excoriation at the base of the third toe dorsally. Skin on the bottom of his left foot is macerated looking. The left the wounds on the lateral right leg actually looks some better although he did require debridement of the top half of this wound area with an open curet 06/09/18 on evaluation today patient appears to be doing poorly in regard to his right lower extremity in particular this appears to likely be infected he has very thick purulent discharge along with a bright green tent to the discharge. This makes me concerned about the possibility of pseudomonas. He's also having increased discomfort at this point on evaluation. Fortunately there does not appear to be any evidence of infection spreading to the other location at this time. 06/16/18 on evaluation today patient appears to actually be doing fairly well. His ulcer has actually diminished in size quite significantly at this point which is good news. Nonetheless he still does have some evidence of infection he did see infectious disease this morning before coming here for his appointment. I did review the results of their evaluation and their note today. They did actually have him discontinue the Cipro and initiate treatment with linezolid at this time. He is doing this for the  next seven days and they recommended a follow-up in four months with them. He is the keep a log of the need for intermittent antibiotic therapy between now and when he falls back up with infectious disease. This will help them gaze what exactly they need to do to try and help them out. 06/23/18; the patient arrives today with no open wounds on the left leg and left third toe healed. He is been using his compression pumps twice a day. On the right lateral leg he still has a sizable wound but this is a lot better than last time I saw this. 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 Has been using silver alginate under 4 layer compression 06/30/18; the only open wound he has is on the right lateral leg and this looks healthy. No debridement is required. We have been using silver  alginate. He does not have an open wound on the left leg. There is apparently some drainage from the dorsal proximal third toe on the left although I see no open wound here. 07/03/18 on evaluation today patient was actually here just for a nurse visit rapid change. However when he was here on Wednesday for his rat change due to having been healed on the left and then developing blisters we initiated the wrap again knowing that he would be back today for [place] to reevaluate and see were at. Unfortunately he has developed some cellulitis into the proximal portion of his right lower extremity even into the region of his thigh. 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 He was placed on one as what at that point. Nonetheless he is done with that and has been tolerating it well otherwise. Doxycycline which in the past really did not seem to be effective for him. Nonetheless I think the best option may be for [place] to definitely reinitiate the antibiotics for a longer period of time. 07/07/18; since I  last saw this patient a week ago he has had a difficult time. At that point he did not have an open wound on his left leg. We transitioned him into juxta light stockings. He was apparently in the clinic the next day with blisters on the left lateral and left medial lower calf. He also had weeping edema fluid. He was put back into a compression wrap. He was also in the clinic on Friday with intense erythema in his right thigh. Per the patient he was started on Bactrim however that didn't work at all in terms of relieving his pain and swelling. He has taken 3 doxycycline that he had left over from last time and that seems to of helped. He has blistering on the right thigh as well. 07/14/18; the erythema on his right thigh has gotten better with doxycycline that he is finishing. 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 Presumably this cellulitis in the thigh was not related to that although I think this is a bit concerning going forward. He still has an area on the right lateral calf the blister on the right medial calf just below the knee that was discussed above. On the left 2 small open areas left medial and left lateral. Edema control is adequate. He is using his compression pumps twice a day 07/20/18; continued improvement in the condition of both legs especially the edema in his bilateral thighs. He tells me he is been losing weight through a combination of diet and exercise. He is using his compression pumps twice a day. So overall she made to the remaining wounds 07/27/2018; continued improvement in condition of both legs. His edema is well controlled. The area on the right lateral leg is just about closed he had one blisters show up on the medial left upper calf. We have him in 4 layer compression. He is going on a 10-day trip to [place], [place] and [place]. He will be driving. He wants to wear Unna boots because of  the lessening amount of constriction. He will not use compression pumps while he is away 08/05/18 on evaluation today patient actually appears to be doing decently well all things considered in regard to his bilateral lower extremities. The worst ulcer is actually only posterior aspect of his left lower  extremity with a four layer compression wrap cut into his leg a couple weeks back. He did have a trip and actually had [employer] for the trip that he is worn since he was last here. Nonetheless he feels like the [employer] actually do better for him his swelling is up a little bit but he also with his trip was not taking his Lasix on a regular set schedule like he was supposed to be. He states that obviously the reason being that he cannot drive and keep going without having to urinate too frequently which makes it difficult. He did not have his pumps with him while he was away either which I think also maybe playing a role here too. 08/13/2018; the patient only has a small open wound on the right lateral calf which is a big improvement in the last month or 2. He also has the area posteriorly just below the posterior fossa on the left which I think was a wrap injury from several weeks ago. He has no current evidence of cellulitis. He tells me he is back into his compression pumps twice a day. He also tells me that while he was at the laundromat somebody stole a section of his extremitease stockings 08/20/2018; back in the clinic with a much improved state. He only has small areas on the right lateral mid calf which is just about healed. This was is more substantial area for quite a prolonged period of time. He has a small open area on the left anterior tibia. The area on the posterior calf just below the popliteal fossa is closed today. He is using his compression pumps twice a day 08/28/2018; patient has no open wound on the right leg. He has a smattering of open areas on the calf with some  weeping lymphedema. More problematically than that it looks as though his wraps of slipped down in his usual he has very angry upper area of edema just below the right medial knee and on the right lateral calf. He has no open area on his feet. The patient is traveling to [name] next week. I will send him in an antibiotic. We will continue to wrap the right leg. We ordered extremitease stockings for him last week and I plan to transition the right leg to a stocking when he gets home which will be in 10 days time. As usual he is very reluctant to take his pumps with him when he travels 09/07/2018; patient returns from [facility]. He shows me a picture of his left leg in the mid part of his trip last week with intense fire engine erythema. The picture look bad enough I would have considered sending him to the hospital. Instead he went to the [name] in [facility]. They did not prescribe him antibiotics but he did take some doxycycline he had leftover from a previous visit. I had given him trimethoprim sulfamethoxazole before he left this did not work according to the patient. This is resulted in some improvement fortunately. He comes back with a large wound on the left posterior calf. Smaller area on the left anterior tibia. Denuded blisters on the dorsal left foot over his toes. Does not have much in the way of wounds on the right leg although he does have a very tender area on the right posterior area just below the popliteal fossa also suggestive of infection. He promises me he is back on his pumps twice a  day 09/15/2018; the intense cellulitis in his left lower calf is a lot better. The wound area on the posterior left calf is also so better. However he has reasonably extensive wounds on the dorsal aspect of his second and third toes and the proximal foot just at the base of the toes. There is nothing open on the right leg 09/22/2018; the patient has excellent edema  control in his legs bilaterally. He is using his external compression pumps twice a day. He has no open area on the right leg and only the areas in the left foot dorsally second and third toe area on the left side. He does not have any signs of active cellulitis. 10/06/2018; the patient has good edema control bilaterally. He has no open wound on the right leg. There is a blister in the posterior aspect of his left calf that we had to deal with today. He is using his compression pumps twice a day. There is no signs of active cellulitis. We have been using silver alginate to the wound areas. He still has vulnerable areas on the base of his left first second toes dorsally He has a his extremities stockings and we are going to transition him today into the stocking on the right leg. He is cautioned that he will need to continue to use the compression pumps twice a day. If he notices uncontrolled edema in the right leg he may need to go to 3 times a day. 10/13/2018; the patient came in for a nurse check on Friday he has a large flaccid blister on the right medial calf just below the knee. We unroofed this. He has this and a new area underneath the posterior mid calf which was undoubtedly a blister as well. He also has several small areas on the right which is the area we put his extremities stocking on. 10/19/2018; the patient went to see infectious disease this morning I am not sure if that was a routine follow-up in any case the doxycycline I had given him was discontinued and started on linezolid. He has not started this. It is easy to look at his left calf and the inflammation and think this is cellulitis however he is very tender in the tissue just below the popliteal fossa and I have no doubt that there is infection going on here. He states the problem he is having is that with the compression pumps the edema goes down and then starts walking the wrap falls down. We will see if we can adhere this. He  has 1 or 2 minuscule open areas on the right still areas that are weeping on the posterior left calf, the base of his left second and third toes 10/26/18; back today in clinic with quite of skin breakdown in his left anterior leg. This may have been infection the area below the popliteal fossa seems a lot better however tremendous epithelial loss on the left anterior mid tibia area over quite inexpensive tissue. He has 2 blisters on the right side but no other open wound here. 10/29/2018; came in urgently to see [place] today and we worked him in for review. He states that the 4 layer compression on the right leg caused pain he had to cut it down to roughly his mid calf this caused swelling above the wrap and he has blisters and skin breakdown today. As a result of the pain he has not been using his pumps. Both legs are a lot more edematous and there  is a lot of weeping fluid. 11/02/18; arrives in clinic with continued difficulties in the right leg> left. Leg is swollen and painful. multiple skin blisters and new open areas especially laterally. He has not been using his pumps on the right leg. 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 He is not systemically unwell. 11/09/2018; the patient claims he is being compliant with his pumps. He is finished the doxycycline I gave him last week. Culture I did of the wound on the right lateral leg showed a few very resistant methicillin staph aureus. This was resistant to doxycycline. Nevertheless he states the pain in the leg is a lot better which makes me wonder if the cultured organism was not really what was causing the problem nevertheless this is a very dangerous organism to be culturing out of any wound. His right leg is still a lot larger than the left. He is using an Radio broadcast assistant on this area, he blames a 4-layer compression for causing the original skin breakdown which I doubt is true however I cannot talk him out  of it. We have been using silver alginate to all of these areas which were initially blisters 11/16/2018; patient is being compliant with his external compression pumps at twice a day. Miraculously he arrives in clinic today with absolutely no open wounds. He has better edema control on the left where he has been using 4 layer compression versus wound of wounds on the right and I pointed this out to him. There is no inflammation in the skin in his lower legs which is also somewhat unusual for him. There is no open wounds on the dorsal left foot. He has extremitease stockings at home and I have asked him to bring these in next week. 11/25/18 patient's lower extremity on examination today on the left appears for the most part to be wound free. He does have an open wound on the lateral aspect of the right lower extremity but this is minimal compared to what I've seen in past. He does request that we go ahead and wrap the left leg as well even though there's nothing open just so hopefully it will not reopen in short order. 1/28; patient has superficial open wounds on the right lateral calf left anterior calf and left posterior calf. His edema control is adequate. 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 We have been using silver alginate as the primary dressing. He claims compliance with his compression pumps 2/4; patient has superficial open wounds on numerous areas of his left calf and again one on the left dorsal foot. The areas on the right lateral calf have healed. The cellulitis that I gave him doxycycline for last week is also resolved this was mostly on the left anterior calf just below the tibial tuberosity. His edema looks fairly well-controlled. He tells me he went to see his primary doctor today and had blood work ordered 2/11; once again he has several open areas on the left calf left tibial area. Most of  these are small and appear to have healthy granulation. He does not have anything open on the right. The edema and control in his thighs is pretty good which is usually a good indication he has been using his pumps as requested. 2/18; he continues to have several small areas on the left calf and left tibial area. Most of  these are small healthy granulation. We put him in his stocking on the right leg last week and he arrives with a superficial open area over the right upper tibia and a fairly large area on the right lateral tibia in similar condition. His edema control actually does not look too bad, he claims to be using his compression pumps twice a day 2/25. Continued small areas on the left calf and left tibial area. New areas especially on the right are identified just below the tibial tuberosity and on the right upper tibia itself. There are also areas of weeping edema fluid even without an obvious wound. He does not have a considerable degree of lymphedema but clearly there is more edema here than his skin can handle. He states he is using the pumps twice a day. We have an Unna boot on the right and 4 layer compression on the left. 3/3; he continues to have an area on the right lateral calf and right posterior calf just below the popliteal fossa. There is a fair amount of tenderness around the wound on the popliteal fossa but I did not see any evidence of cellulitis, could just be that the wrap came down and rubbed in this area. He does not have an open area on the left leg however there is an area on the left dorsal foot at the base of the third toe We have been using silver alginate to all wound areas 3/10; he did not have an open area on his left leg last time he was here a week ago. T oday he arrives with a horizontal wound just below the tibial tuberosity and an area on the left lateral calf. He has intense erythema and tenderness in this area. The area is on the right lateral calf and  right posterior calf better than last week. We have been using silver alginate as usual 3/18 - Patient returns with 3 small open areas on left calf, and 1 small open area on right calf, the skin looks ok with no significant erythema, he continues the UNA boot on right and 4 layer compression on left. The right lateral calf wound is closed , the right posterior is small area. we will continue silver alginate to the areas. 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 He is been tolerating the dressing changes without complication. Fortunately she has made excellent improvement in regard to the overall status of his wounds. Unfortunately every time we cease wrapping him he ends up reopening in causing more significant issues at that point. Again I'm unsure of the best direction to take although I think the lymphedema clinic may be appropriate for him. 02/03/19 on evaluation today patient appears to be doing well in regard to the wounds that we saw him for last week unfortunately he has a new area on the proximal portion of his right medial/posterior lower extremity where the wrap somewhat slowed down and caused swelling and a blister to rub and open. Unfortunately this is the only opening that he has on either leg at this point. 02/17/19 on evaluation today patient's bilateral lower extremities appear to be doing well. He still completely healed in regard to the left lower extremity. In regard to the right lower extremity the area where the wrap and slid down and caused the  blister still seems to be slightly open although this is dramatically better than during the last evaluation two weeks ago. I'm very pleased with the way this stands overall. 03/03/19 on evaluation today patient appears to be doing well in regard  to his right lower extremity in general although he did have a new blister open this does not appear to be showing any evidence of active infection at this time. Fortunately there's No fevers, chills, nausea, or vomiting noted at this time. Overall I feel like he is making good progress it does feel like that the right leg will we perform the [employer] seems to do with a bit better than three layer wrap on the left which slid down on him. We may switch to doing bilateral in the book wraps. 5/4; I have not seen Mr. Hotard in quite some time. According to our case manager he did not have an open wound on his left leg last week. He had 1 remaining wound on the right posterior medial calf. He arrives today with multiple openings on the left leg probably were blisters and/or wrap injuries from Unna boots. I do not think the Unna boot's will provide adequate compression on the left. I am also not clear about the frequency he is using the compression pumps. 03/17/19 on evaluation today patient appears to be doing excellent in regard to his lower extremities compared to last week's evaluation apparently. He had gotten significantly worse last week which is unfortunate. The [employer] wrap on the left did not seem to do very well for him at all and in fact it didn't control his swelling significantly enough he had an additional outbreak. Subsequently we go back to the four layer compression wrap on the left. This is good news. At least in that he is doing better and the wound seem to be killing him. He still has not heard anything from the lymphedema clinic. 03/24/19 on evaluation today patient actually appears to be doing much better in regard to his bilateral lower Kevin as compared to last week when I saw him. Fortunately there's no signs of active infection at this time. He has been tolerating the dressing changes without complication. Overall I'm extremely pleased with the progress and appearance in  general. 04/07/19 on evaluation today patient appears to be doing well in regard to his bilateral lower extremities. His swelling is significantly down from where it was previous. With that being said he does have a couple blisters still open at this point but fortunately nothing that seems to be too severe and again the majority of the larger openings has healed at this time. 04/14/19 on evaluation today patient actually appears to be doing quite well in regard to his bilateral lower extremities in fact I'm not even sure there's anything significantly open at this time at any site. Nonetheless he did have some trouble with these wraps where they are somewhat irritating him secondary to the fact that he has noted that the graph wasn't too close down to the end of this foot in a little bit short as well up to his knee. Otherwise things seem to be doing quite well. 04/21/19 upon evaluation today patient's wound bed actually showed evidence of being completely healed in regard to both lower extremities which is excellent news. There does not appear to be any signs of active infection which is also good news. I'm very pleased in this regard. No fevers, chills, nausea, or vomiting noted at this time.  04/28/19 on evaluation today patient appears to be doing a little bit worse in regard to both lower extremities on the left mainly due to the fact that when he went infection disease the wrap was not wrapped quite high enough he developed a blister above this. On the right he is a small open area of nothing too significant but again this is continuing to give him some trouble he has been were in the Velcro compression that he has at home. 05/05/19 upon evaluation today patient appears to be doing better with regard to his lower Kevin ulcers. He's been tolerating the dressing changes without complication. Fortunately there's no signs of active infection at this time. No fevers, chills, nausea, or vomiting noted at  this time. We have been trying to get an appointment with her lymphedema clinic in [name] but unfortunately nobody can get them on phone with not been able to even fax information over the patient likewise is not been able to get in touch with them. Overall I'm not sure exactly what's going on here with to reach out again today. 05/12/19 on evaluation today patient actually appears to be doing about the same in regard to his bilateral lower Kevin ulcers. Still having a lot of drainage unfortunately. He tells me especially in the left but even on the right. There's no signs of active infection which is good news we've been using so ratcheted up to this point. 05/19/19 on evaluation today patient actually appears to be doing quite well with regard to his left lower extremity which is great news. Fortunately in regard to the right lower extremity has an issues with his wrap and he subsequently did remove this from what I'm understanding. Nonetheless long story short is what he had rewrapped once he removed it subsequently had maggots underneath this wrap whenever he came in for evaluation today. With that being said they were obviously completely cleaned away by the nursing staff. The visit today which is excellent news. However he does appear to potentially have some infection around the right ankle region where the maggots were located as well. He will likely require anabiotic therapy today. 05/26/19 on evaluation today patient actually appears to be doing much better in regard to his bilateral lower extremities. I feel like the infection is under much better control. With that being said there were maggots noted when the wrap was removed yet again today. Again this could have potentially been left over from previous although at this time there does not appear to be any signs of significant drainage there was obviously on the wrap some drainage as well this contracted gnats or  otherwise. Either way I do not see anything that appears to be doing worse in my pinion and in fact I think his drainage has slowed down quite significantly likely mainly due to the fact to his infection being under better control. 06/02/2019 on evaluation today patient actually appears to be doing well with regard to his bilateral lower extremities there is no signs of active infection at this time which is great news. With that being said he does have several open areas more so on the right than the left but nonetheless these are all significantly better than previously noted. 06/09/2019 on evaluation today patient actually appears to be doing well. His wrap stayed up and he did not cause any problems he had more drainage on the right compared to the left but overall I do not see any major issues  at this time which is great news. 06/16/2019 on evaluation today patient appears to be doing excellent with regard to his lower extremities the only area that is open is a new blister that can have opened as of today on the medial ankle on the left. Other than this he really seems to be doing great I see no major issues at this point. 06/23/2019 on evaluation today patient appears to be doing quite well with regard to his bilateral lower extremities. In fact he actually appears to be almost completely healed there is a small area of weeping noted of the right lower extremity just above the ankle. Nonetheless fortunately there is no signs of active infection at this time which is good news. No fevers, chills, nausea, vomiting, or diarrhea. 8/24; the patient arrived for a nurse visit today but complained of very significant pain in the left leg and therefore I was asked to look at this. Noted that he did not have an open area on the left leg last week nevertheless this was wrapped. The patient states that he is not been able to put his compression pumps on the left leg because of the discomfort. He has not been  systemically unwell 06/30/2019 on evaluation today patient unfortunately despite being excellent last week is doing much worse with regard to his left lower extremity today. In fact he had to come in for a nurse on Monday where his left leg had to be rewrapped due to excessive weeping Dr. Leanord Hawking placed him on doxycycline at that point. Fortunately there is no signs of active infection Systemically at this time which is good news. 07/07/2019 in regard to the patient's wounds today he actually seems to be doing well with his right lower extremity there really is nothing open or draining at this point this is great news. Unfortunately the left lower extremity is given him additional trouble at this time. There does not appear to be any signs of active infection nonetheless he does have a lot of edema and swelling noted at this point as well as blistering all of which has led to a much more poor appearing leg at this time compared to where it was 2 weeks ago when it was almost completely healed. Obviously this is a little discouraging for the patient. He is try to contact the lymphedema clinic in [place] he has not been able to get through to them. 07/14/2019 on evaluation today patient actually appears to be doing slightly better with regard to his left lower extremity ulcers. Overall I do feel like at least at the top of the wrap that we have been placing this area has healed quite nicely and looks much better. The remainder of the leg is showing signs of improvement. Unfortunately in the thigh area he still has an open region on the left and again on the right he has been utilizing just a Band-Aid on an area that also opened on the thigh. Again this is an area that were not able to wrap although we did do an Ace wrap to provide some compression that something that obviously is a little less effective than the compression wraps we have been using on the lower portion of the leg. He does have an  appointment with the lymphedema clinic in [facility] on Friday. 07/21/2019 on evaluation today patient appears to be doing better with regard to his lower extremity ulcers. He has been tolerating the dressing changes without complication. Fortunately there is no signs of  active infection at this time. No fevers, chills, nausea, vomiting, or diarrhea. I did receive the paperwork from the physical therapist at the lymphedema clinic in [place]. Subsequently I signed off on that this morning and sent that back to him for further progression with the treatment plan. 07/28/2019 on evaluation today patient appears to be doing very well with regard to his right lower extremity where I do not see any open wounds at this point. Fortunately he is feeling great as far as that is concerned as well. In regard to the left lower extremity he has been having issues with still several areas of weeping and edema although the upper leg is doing better his lower leg still I think is going require the compression wrap at this time. No fevers, chills, nausea, vomiting, or diarrhea. 08/04/2019 on evaluation today patient unfortunately is having new wounds on the right lower extremity. Again we have been using Unna boot wrap on that side. We switched him to using his juxta lite wrap at home. With that being said he tells me he has been using it although his legs extremely swollen and to be honest really does not appear that he has been. I cannot know that for sure however. Nonetheless he has multiple new wounds on the right lower extremity at this time. Obviously we will have to see about getting this rewrapped for him today. 08/11/2019 on evaluation today patient appears to be doing fairly well with regard to his wounds. He has been tolerating the dressing changes including the compression wraps without complication. He still has a lot of edema in his upper thigh regions bilaterally he is supposed to be seeing the  lymphedema clinic on the 15th of this month once his wraps arrive for the upper part of his legs. 08/18/2019 on evaluation today patient appears to be doing well with regard to his bilateral lower extremities at this point. He has been tolerating the dressing changes without complication. Fortunately there is no signs of active infection which is also good news. He does have a couple weeping areas on the first and second toe of the right foot he also has just a small area on the left foot upper leg and a small area on the left lower leg but overall he is doing quite well in my opinion. He is supposed to be getting his wraps shortly in fact tomorrow and then subsequently is seeing the lymphedema clinic next Wednesday on the 21st. Of note he is also leaving on the 25th to go on vacation for a week to the beach. For that reason and since there is some uncertainty about what there can be doing at lymphedema clinic next Wednesday I am get a make an appointment for next Friday here for [place] to see what we need to do for him prior to him leaving for vacation. 10/23; patient arrives in considerable pain predominantly in the upper posterior calf just distal to the popliteal fossa also in the wound anteriorly above the major wound. This is probably cellulitis and he has had this recurrently in the past. He has no open wound on the right side and he has had an Radio broadcast assistant in that area. Finally I note that he has an area on the left posterior calf which by enlarge is mostly epithelialized. This protrudes beyond the borders of the surrounding skin in the setting of dry scaly skin and lymphedema. The patient is leaving for [name] on Sunday. Per his longstanding pattern,  he will not take his compression pumps with him predominantly out of fear that they will be stolen. He therefore asked that we put a Unna boot back on the right leg. He will also contact the [facility] care [name] in [facility] to see if they can  change his dressing in the mid week. 11/3; patient returned from his vacation to [facility]. He was seen on 1 occasion at their wound care [name]. They did a 2 layer compression system as they did not have our 4-layer wrap. I am not completely certain what they put on the wounds. They did not change the Unna boot on the right. The patient is also seeing a lymphedema specialist physical therapist in [place]. It appears that he has some compression sleeve for his thighs which indeed look quite a bit better than I am used to seeing. He pumps over these with his external compression pumps. 11/10; the patient has a new wound on the right medial thigh otherwise there is no open areas on the right. He has an area on the left leg posteriorly anteriorly and medially and an area over the left second toe. We have been using silver alginate. He thinks the injury on his thigh is secondary to friction from the compression sleeve he has. 11/17; the patient has a new wound on the right medial thigh last week. He thinks this is because he did not have a underlying stocking for his thigh juxta lite apparatus. He now has this. The area is fairly large and somewhat angry but I do not think he has underlying cellulitis. He has a intact blister on the right anterior tibial area. Small wound on the right great toe dorsally Small area on the medial left calf. 11/30; the patient does not have any open areas on his right leg and we did not take his juxta lite stocking off. However he states that on Friday his compression wrap fell down lodging around his upper mid calf area. As usual this creates a lot of problems for him. He called urgently today to be seen for a nurse visit however the nurse visit turned into a provider visit because of extreme erythema and pain in the left anterior tibia extending laterally and posteriorly. The area that is problematic is extensive 10/06/2019 upon evaluation today patient actually  appears to be doing poorly in regard to his left lower extremity. He Dr. Leanord Hawking did place him on doxycycline this past Monday apparently due to the fact that he was doing much worse in regard to this left leg. Fortunately the doxycycline does seem to be helping. Unfortunately we are still having a very difficult time getting his edema under any type of control in order to anticipate discharge at some point. The only way were really able to control his lymphedema really is with compression wraps and that has only even seemingly temporary. He has been seeing a lymphedema clinic they are trying to help in this regard but still this has been somewhat frustrating in general for the patient. 10/13/19 on evaluation today patient appears to be doing excellent with regard to his right lower extremity as far as the wounds are concerned. His swelling is still quite extensive unfortunately. He is still having a lot of drainage from the thigh areas bilaterally which is unfortunate. He's been going to lymphedema clinic but again he still really does not have this edema under control as far as his lower extremities are concern. With regard to his left  lower extremity this seems to be improving and I do believe the doxycycline has been of benefit for him. He is about to complete the doxycycline. 10/20/2019 on evaluation today patient appears to be doing poorly in regard to his bilateral lower extremities. More in the right thigh he has a lot of irritation at this site unfortunately. In regard to the left lower extremity the wrap was not quite as high it appears and does seem to have caused him some trouble as well. Fortunately there is no evidence of systemic infection though he does have some blue-green drainage which has me concerned for the possibility of Pseudomonas. He tells me he is previously taking Cipro without complications and he really does not care for Levaquin however due to some of the side effects he  has. He is not allergic to any medications specifically antibiotics that were aware of. 10/27/2019 on evaluation today patient actually does appear to be for the most part doing better when compared to last week's evaluation. With that being said he still has multiple open wounds over the bilateral lower extremities. He actually forgot to start taking the Cipro and states that he still has the whole bottle. He does have several new blisters on left lower extremity today I think I would recommend he go ahead and take the Cipro based on what I am seeing at this point. 12/30-Patient comes at 1 week visit, 4 layer compression wraps on the left and Unna boot on the right, primary dressing Xtrasorb and silver alginate. Patient is taking his Cipro and has a few more days left probably 5-6, and the legs are doing better. He states he is using his compressions devices which I believe he has 11/10/2019 on evaluation today patient actually appears to be much better than last time I saw him 2 weeks ago. His wounds are significantly improved and overall I am very pleased in this regard. Fortunately there is no signs of active infection at this time. He is just a couple of days away from completing Cipro. Overall his edema is much better he has been using his lymphedema pumps which I think is also helping at this point. 11/17/2019 on evaluation today patient appears to be doing excellent in regard to his wounds in general. His legs are swollen but not nearly as much as they have been in the past. Fortunately he is tolerating the compression wraps without complication. No fevers, chills, nausea, vomiting, or diarrhea. He does have some erythema however in the distal portion of his right lower extremity specifically around the forefoot and toes there is a little bit of warmth here as well. 11/24/2019 on evaluation today patient appears to be doing well with regard to his right lower extremity I really do not see any open  wounds at this point. His left lower extremity does have several open areas and his right medial thigh also is open. Other than this however overall the patient seems to be making good progress and I am very pleased at this point. 12/01/2019 on evaluation today patient appears to be doing poorly at this point in regard to his left lower extremity has several new blisters despite the fact that we have him in compression wraps. In fact he had a 4-layer compression wrap, his upper thigh wrapped from lymphedema clinic, and a juxta light over top of the 4 layer compression wrap the lymphedema clinic applied and despite all this he still develop blisters underneath. Obviously this does have me concerned  about the fact that unfortunately despite what we are doing to try to get wounds healed he continues to have new areas arise I do not think he is ever good to be at the point where he can realistically just use wraps at home to keep things under control. Typically when we heal him it takes about 1-2 days before he is back in the clinic with severe breakdown and blistering of his lower extremities bilaterally. This is happened numerous times in the past. Unfortunately I think that we may need some help as far as overall fluid overload to kind of limit what we are seeing and get things under better control. 12/08/2019 on evaluation today patient presents for follow-up concerning his ongoing bilateral lower extremity edema. Unfortunately he is still having quite a bit of swelling the compression wraps are controlling this to some degree but he did see Dr. [name] his cardiologist I do have that available for review today as far as the appointment was concerned that was on 12/06/2019. Obviously that she has been 2 days ago. The patient states that he is only been taking the Lasix 80 mg 1 time a day he had told me previously he was taking this twice a day. Nonetheless Dr. [name] recommended this be up to 80 mg 2 times a  day for the patient as he did appear to be fluid overloaded. With that being said the patient states he did this yesterday and he was unable to go anywhere or do anything due to the fact that he was constantly having to urinate. Nonetheless I think that this is still good to be something that is important for him as far as trying to get his edema under control at all things that he is going to be able to just expect his wounds to get under control and things to be better without going through at least a period of time where he is trying to stabilize his fluid management in general and I think increasing the Lasix is likely the first step here. It was also mentioned the possibility that the patient may require metolazone. With that being said he wanted to have the patient take Lasix twice a day first and then reevaluating 2 months to see where things stand. 12/15/2019 upon evaluation today patient appears to be doing regard to his legs although his toes are showing some signs of weeping especially on the left at this point to some degree on the right. There does not appear to be any signs of active infection and overall I do feel like the compression wraps are doing well for him but he has not been able to take the Lasix at home and the increased dose that Dr. [name] recommended. He tells me that just not go to be feasible for him. Nonetheless I think in this case he should probably send a message to Dr. [name] in order to discuss options from the standpoint of possible admission to get the fluid off or otherwise going forward. 12/22/2019 upon evaluation today patient appears to be doing fairly well with regard to his lower extremities at this point. In fact he would be doing excellent if it was not for the fact that his right anterior thigh apparently had an allergic reaction to adhesive tape that he used. The wound itself that we have been monitoring actually appears to be healed. There is a lot of  irritation at this point. 12/29/2019 upon evaluation today patient appears to be doing well  in regard to his lower extremities. His left medial thigh is open and somewhat draining today but this is the only region that is open the right has done much better with the treatment utilizing the steroid cream that I prescribed for him last week. Overall I am pleased in that regard. Fortunately there is no signs of active infection at this time. No fevers, chills, nausea, vomiting, or diarrhea. 01/05/2020 upon evaluation today patient appears to be doing more poorly in regard to his right lower extremity at this point upon evaluation today. Unfortunately he continues to have issues in this regard and I think the biggest issue is controlling his edema. This obviously is not very well controlled at this point is been recommended that he use the Lasix twice a day but he has not been able to do that. Unfortunately I think this is leading to an issue where honestly he is not really able to effectively control his edema and therefore the wounds really are not doing significantly better. I do not think that he is going to be able to keep things under good control unless he is able to control his edema much better. I discussed this again in great detail with him today. 01/12/2020 good news is patient actually appears to be doing quite well today at this point. He does have an appointment with lymphedema clinic tomorrow. His legs appear healed and the toe on the left is almost completely healed. In general I am very pleased with how things stand at this point. 01/19/2020 upon evaluation today patient appears to actually be doing well in regard to his lower extremities there is nothing open at this point. Fortunately he has done extremely well more recently. Has been seeing lymphedema clinic as well. With that being said he has Velcro wraps for his lower legs as well as his upper legs. The only wound really is on his toe  which is the right great toe and this is barely anything even there. With all that being said I think it is good to be appropriate today to go ahead and switch him over to the Velcro compression wraps. 01/26/2020 upon evaluation today patient appears to be doing worse with regard to his lower extremities after last week switch him to Velcro compression wraps. Unfortunately he lasted less than 24 hours he did not have the sock portion of his Velcro wrap on the left leg and subsequently developed a blister underneath the Velcro portion. Obviously this is not good and not what we were looking for at this point. He states the lymphedema clinic did tell him to wear the wrap for 23 hours and take him off for 1 I am okay with that plan but again right now we got a get things back under control again he may have some cellulitis noted as well. 02/02/2020 upon evaluation today patient unfortunately appears to have several areas of blistering on his bilateral lower extremities today mainly on the feet. His legs do seem to be doing somewhat better which is good news. Fortunately there is no evidence of active infection at this time. No fevers, chills, nausea, vomiting, or diarrhea. 02/16/2020 upon evaluation today patient appears to be doing well at this time with regard to his legs. He has a couple weeping areas on his toes but for the most part everything is doing better and does appear to be sealed up on his legs which is excellent news. We can continue with wrapping him at this point  as he had every time we discontinue the wraps he just breaks out with new wounds. There is really no point in is going forward with this at this point. 03/08/2020 upon evaluation today patient actually appears to be doing quite well with regard to his lower extremity ulcers. He has just a very superficial and really almost nonexistent blister on the left lower extremity he has in general done very well with the compression wraps. With  that being said I do not see any signs of infection at this time which is good news. 03/29/2020 upon evaluation today patient appears to be doing well with regard to his wounds currently except for where he had several new areas that opened up due to some of the wrap slipping and causing him trouble. He states he did not realize they had slipped. Nonetheless he has a 1 area on the right and 3 new areas on the left. Fortunately there is no signs of active infection at this time which is great news. 04/05/2020 upon evaluation today patient actually appears to be doing quite well in general in regard to his legs currently. Fortunately there is no signs of active infection at this time. No fevers, chills, nausea, vomiting, or diarrhea. He tells me next week that he will actually be seen in the lymphedema clinic on Thursday at 10 AM I see him on Wednesday next week. 04/12/2020 upon evaluation today patient appears to be doing very well with regard to his lower extremities bilaterally. In fact he does not appear to have any open wounds at this point which is good news. Fortunately there is no signs of active infection at this time. No fevers, chills, nausea, vomiting, or diarrhea. 04/19/2020 upon evaluation today patient appears to be doing well with regard to his wounds currently on the bilateral lower extremities. There does not appear to be any signs of active infection at this time. Fortunately there is no evidence of systemic infection and overall very pleased at this point. Nonetheless after I held him out last week he literally had blisters the next morning already which swelled up with him being right back here in the clinic. Overall I think that he is just not can be able to be discharged with his legs the way they are he is much to volume overloaded as far as fluid is concerned and that was discussed with him today of also discussed this but should try the clinic nurse manager as well as Dr.  Leanord Hawkingobson. 04/26/2020 upon evaluation today patient appears to be doing better with regard to his wounds currently. He is making some progress and overall swelling is under good control with the compression wraps. Fortunately there is no evidence of active infection at this time. 05/10/2020 on evaluation today patient appears to be doing overall well in regard to his lower extremities bilaterally. He is Tolerating the compression wraps without complication and with what we are seeing currently I feel like that he is making excellent progress. There is no signs of active infection at this time. 05/24/2020 upon evaluation today patient appears to be doing well in regard to his legs. The swelling is actually quite a bit down compared to where it has been in the past. Fortunately there is no sign of active infection at this time which is also good news. With that being said he does have several wounds on his toes that have opened up at this point. 05/31/2020 upon evaluation today patient appears to be doing well  with regard to his legs bilaterally where he really has no significant fluid buildup at this point overall he seems to be doing quite well. Very pleased in this regard. With regard to his toes these also seem to be drying up which is excellent. We have continue to wrap him as every time we tried as a transition to the juxta light wraps things just do not seem to get any better. 06/07/2020 upon evaluation today patient appears to be doing well with regard to his right leg at this point. Unfortunately left leg has a lot of blistering he tells me the wrap started to slide down on him when he tried to put his other Velcro wrap over top of it to help keep things in order but nonetheless still had some issues. 06/14/2020 on evaluation today patient appears to be doing well with regard to his lower extremity ulcers and foot ulcers at this point. I feel like everything is actually showing signs of improvement which  is great news overall there is no signs of active infection at this time. No fevers, chills, nausea, vomiting, or diarrhea. 06/21/2020 on evaluation today patient actually appears to be doing okay in regard to his wounds in general. With that being said the biggest issue I see is on his right foot in particular the first and second toe seem to be doing a little worse due to the fact this is staying very wet. I think he is probably getting need to change out his dressings a couple times in between each week when we see him in regard to his toes in order to keep this drier based on the location and how this is proceeding. 06/28/2020 on evaluation today patient appears to be doing a little bit more poorly overall in regard to the appearance of the skin I am actually somewhat concerned about the possibility of him having a little bit of an infection here. We discussed the course of potentially giving him a doxycycline prescription which he is taken previously with good result. With that being said I do believe that this is potentially mild and at this point easily fixed. I just do not want anything to get any worse. 07/12/2020 upon evaluation today patient actually appears to be making some progress with regard to his legs which is great news there does not appear to be any evidence of active infection. Overall very pleased with where things stand. 07/26/2020 upon evaluation today patient appears to be doing well with regard to his leg ulcers and toe ulcers at this point. He has been tolerating the compression wraps without complication overall very pleased in this regard. 08/09/2020 upon evaluation today patient appears to be doing well with regard to his lower extremities bilaterally. Fortunately there is no signs of active infection overall I am pleased with where things stand. 08/23/2020 on evaluation today patient appears to be doing well with regard to his wound. He has been tolerating the dressing  changes without complication. Fortunately there is no signs of active infection at this time. Overall his legs seem to be doing quite well which is great news and I am very pleased in that regard. No fevers, chills, nausea, vomiting, or diarrhea. 09/13/2020 upon evaluation today patient appears to be doing okay in regard to his lower extremities. He does have a fairly large blister on the right leg which I did remove the blister tissue from today so we can get this to dry out other than that however he  seems to be doing quite well. There is no signs of active infection at this time. 09/27/2020 upon evaluation today patient appears to actually be doing some better in regard to his right leg. Fortunately signs of active infection at this time which is great news. No fevers, chills, nausea, vomiting, or diarrhea. 10/04/2020 upon evaluation today patient actually appears to be showing signs of improvement which is great news with regard to his leg ulcers. Fortunately there is no signs of active infection which is great news he is still taking the antibiotics currently. No fevers, chills, nausea, vomiting, or diarrhea. 10/18/2020 on evaluation today patient appears to be doing well with regard to his legs currently. He has been tolerating the dressing changes including the wraps without complication. Fortunately there is no signs of active infection at this time. No fevers, chills, nausea, vomiting, or diarrhea. 10/25/2020 upon evaluation today patient appears to be doing decently well in regard to his wounds currently. He has been tolerating the dressing changes without complication. Overall I feel like he is making good progress albeit slow. Again this is something we can have to continue to wrap for some time to come most likely. 11/08/2020 upon evaluation today patient appears to be doing well with regard to his wounds currently. He has been tolerating the dressing changes without complication is not  currently on any antibiotics and he does not appear to show any signs of infection. He does continue to have a lot of drainage on the right leg not too severe but nonetheless this is very scattered. On the left leg this is looking to be much improved overall. 11/15/2020 upon evaluation today patient appears to be doing better with regard to his legs bilaterally. Especially the right leg which was much more significant last week. There does not appear to be any signs of active infection which is great news. No fevers, chills, nausea, vomiting, or diarrhea. 11/23/2019 upon evaluation today patient appears to be doing poorly still in regard to his lower extremities bilaterally. Unfortunately his right leg in particular appears to be doing much more poorly there is no signs really of infection this is not warm to touch but he does have a lot of drainage and weeping unfortunately. With that reason I do believe that we may need to initiate some treatment here to try to help calm down some of the swelling of the right leg. I think switching to a 4-layer compression wrap would be beneficial here. The patient is in agreement with giving this a try. 11/29/2020 upon evaluation today patient appears to be doing well currently in regard to his leg ulcers. I feel like the right leg is doing better he still has a lot of drainage but we do see some improvement here. The 4-layer compression wrap I think was helpful. 12/06/2020 upon evaluation today patient appears to be doing well with regard to his legs. In fact they seem to be doing about the best I have seen up to this point. Fortunately there is no signs of active infection at this time. No fevers, chills, nausea, vomiting, or diarrhea. 12/20/2020 upon evaluation today patient appears to be doing well at this time in regard to his legs. He is not having any significant draining which is great news. Fortunately there is no signs of active infection at this time. No fevers,  chills, nausea, vomiting, or diarrhea. 01/17/2021 upon evaluation today [name] actually appears to be doing excellent in regard to his legs. He has a  few areas again that come and go as far as his toes are concerned but overall this is doing quite well. 01/31/2021 upon evaluation today patient appears to be doing well with regard to his legs. Fortunately there does not appear to be any signs of active infection which is great news. Overall he is still having significant edema despite the compression wraps basically the 4-layer compression wrap to just keep things under control there is really not much room for play. 4/13: Mr. Huttner is a longstanding patient in our clinic and benefits greatly from weekly compression wraps. Today he has no complaints. He has been tolerating the wraps well. He states he is using the lymphedema pumps at home. 5/4; patient presents for follow-up of his chronic lymphedema/venous insufficiency ulcers. He comes weekly for compression wraps. He has no complaints today. He was unable to tolerate the Coflex 2 layer Last week so we will do the four press 4-layer compression. He has been using his lymphedema pumps daily. 5/18; patient presents for 2-week follow-up. He has no complaints or issues today. He has developed a new wound to the right foot on his fourth toe. He overall feels well and denies signs of infection. 6/1; patient presents for 2-week follow-up. He has no complaints or issues today. He denies signs of infection. 04/18/2021 upon evaluation today patient appears to be doing well with regard to his legs bilaterally. Family open wound is actually on the toe of his left foot everything else is completely closed which is great news. In general I am extremely pleased with where things stand at this point. The patient is also happy that things are doing so well. 05/02/2021 upon evaluation today patient's legs actually appear to be doing quite well today. Fortunately there  does not appear to be any signs of active infection which is great and overall I am extremely pleased with where he stands today. The patient does not appear to have any evidence of active infection at this time which is also great news. 05/09/2021 upon evaluation today patient appears to be doing a little bit more poorly in regard to his legs. Unfortunately he is having issues with some breakdown and a blood blister on the left leg this is due to I believe honestly to how it was wrapped last week. Fortunately there does not appear to be any signs of infection but nonetheless this is still a concern to be honest. No fevers, chills, nausea, vomiting, or diarrhea. 05/16/2021 upon evaluation today patient appears to be doing significantly better as compared to last week. I am very pleased with where things stand today. There does not appear to be any signs of infection which is great news and overall very pleased with where we stand. No fevers, chills, nausea, vomiting, or diarrhea. 05/30/2021 upon evaluation today patient appears to be doing well with regard to his legs. He has been tolerating the dressing changes without complication. Fortunately there does not appear to be any signs of active infection which is great news and overall I am extremely pleased with where things stand today. No fevers, chills, nausea, vomiting, or diarrhea. Electronic Signature(s) Signed: 05/30/2021 11:26:38 AM By: Lenda Kelp PA-C Entered By: Lenda Kelp on 05/30/2021 11:26:37 -------------------------------------------------------------------------------- Physical Exam Details Patient Name: Date of Service: [name], [name] 05/30/2021 10:30 A M Medical Record Number: [id number] Patient Account Number: [id number] Date of Birth/Sex: Treating RN: 07/15/1951 (70 y.o. [name] Primary Care Provider: Nicoletta Ba Other Clinician: Referring Provider:  Treating Provider/Extender: Adele Dan in Treatment: 279 Constitutional Well-nourished and well-hydrated in no acute distress. Respiratory normal breathing without difficulty. Psychiatric this patient is able to make decisions and demonstrates good insight into disease process. Alert and Oriented x 3. pleasant and cooperative. Notes Upon inspection patient appears to again be making excellent progress in regard to his legs and again I see no signs of any infection or worsening in general I think that he is tolerating the dressing changes without complication. Overall I think that the patient is making great progress. Electronic Signature(s) Signed: 05/30/2021 11:26:58 AM By: Lenda Kelp PA-C Entered By: Lenda Kelp on 05/30/2021 11:26:58 -------------------------------------------------------------------------------- Physician Orders Details Patient Name: Date of Service: [name], [name] 05/30/2021 10:30 A M Medical Record Number: [id number] Patient Account Number: [id number] Date of Birth/Sex: Treating RN: [birth year] (70 y.o. [name] Primary Care Provider: Nicoletta Ba Other Clinician: Referring Provider: Treating Provider/Extender: Adele Dan in Treatment: [phone] Verbal / Phone Orders: No Diagnosis Coding ICD-10 Coding Code Description L97.521 Non-pressure chronic ulcer of other 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 of skin L97.829 Non-pressure chronic ulcer of other part of left lower leg with unspecified severity L97.519 Non-pressure chronic ulcer of other part of right foot with unspecified severity I87.333 Chronic venous hypertension (idiopathic) with ulcer and inflammation of bilateral lower extremity I89.0 Lymphedema, not elsewhere classified E11.622 Type 2 diabetes mellitus with other skin ulcer E11.40 Type 2 diabetes mellitus with diabetic neuropathy, unspecified Follow-up Appointments Return appointment in 3 weeks. - with Leonard Schwartz Nurse Visit: - 8/3 and  8/10 Bathing/ Shower/ Hygiene May shower with protection but do not get wound dressing(s) wet. Edema Control - Lymphedema / SCD / Other Bilateral Lower Extremities Lymphedema Pumps. Use Lymphedema pumps on leg(s) 2-3 times a day for 45-60 minutes. If wearing any wraps or hose, do not remove them. Continue exercising as instructed. Elevate legs to the level of the heart or above for 30 minutes daily and/or when sitting, a frequency of: - throughout the day Avoid standing for long periods of time. Exercise regularly Non Wound Condition Bilateral Lower Extremities Other Non Wound Condition Orders/Instructions: - lotion to both legs, 4 layer compression wraps both legs, silver alginate to any weeping areas Wound Treatment Wound #193 - T Second oe Wound Laterality: Left Cleanser: Soap and Water 1 x Per Week/15 Days Discharge Instructions: May shower and wash wound with dial antibacterial soap and water prior to dressing change. Cleanser: Wound Cleanser 1 x Per Week/15 Days Discharge Instructions: Cleanse the wound with wound cleanser prior to applying a clean dressing using gauze sponges, not tissue or cotton balls. Prim Dressing: KerraCel Ag Gelling Fiber Dressing, 2x2 in (silver alginate) 1 x Per Week/15 Days ary Discharge Instructions: Apply silver alginate to wound bed as instructed Secondary Dressing: Woven Gauze Sponges 2x2 in 1 x Per Week/15 Days Discharge Instructions: Apply over primary dressing as directed. Secured With: Insurance underwriter, Sterile 2x75 (in/in) 1 x Per Week/15 Days Discharge Instructions: Secure with stretch gauze as directed. Electronic Signature(s) Signed: 05/30/2021 4:30:28 PM By: Lenda Kelp PA-C Signed: 05/30/2021 5:32:13 PM By: Zenaida Deed RN, BSN Entered By: Zenaida Deed on 05/30/2021 11:14:58 -------------------------------------------------------------------------------- Problem List Details Patient Name: Date of Service: [name], [name] 05/30/2021 10:30 A M Medical Record Number: [id number] Patient Account Number: [id number] Date of Birth/Sex: Treating RN: 1951-06-27 (70 y.o. [name] Primary Care Provider: Nicoletta Ba Other Clinician:  Referring Provider: Treating Provider/Extender: Adele Dan in Treatment: 279 Active Problems ICD-10 Encounter Code Description Active Date MDM Diagnosis L97.521 Non-pressure chronic ulcer of other 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 of 04/27/2018 No Yes skin L97.829 Non-pressure chronic ulcer of other part of left lower leg with unspecified 03/21/2021 No Yes severity L97.519 Non-pressure chronic ulcer of other part of right foot with unspecified severity 03/21/2021 No Yes I87.333 Chronic venous hypertension (idiopathic) with ulcer and inflammation of 01/22/2016 No Yes bilateral lower extremity I89.0 Lymphedema, not elsewhere classified 01/22/2016 No Yes E11.622 Type 2 diabetes mellitus with other skin ulcer 01/22/2016 No Yes E11.40 Type 2 diabetes mellitus with diabetic neuropathy, unspecified 01/22/2016 No Yes Inactive Problems ICD-10 Code Description Active Date Inactive Date L03.115 Cellulitis of right lower limb 12/22/2017 12/22/2017 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 06/30/2018 06/30/2018 L97.511 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 of skin 06/30/2018 06/30/2018 Resolved Problems ICD-10 Code Description Active Date Resolved Date L97.211 Non-pressure chronic ulcer of right calf limited to breakdown of skin 06/30/2018 06/30/2018 L97.221 Non-pressure chronic ulcer of left calf limited to breakdown of skin 09/30/2016 09/30/2016 L03.116 Cellulitis of left lower limb 04/01/2017 04/01/2017 L97.211 Non-pressure chronic ulcer of right calf limited to breakdown of skin 06/30/2017 06/30/2017 Electronic Signature(s) Signed: 05/30/2021 10:58:52 AM By: Lenda Kelp  PA-C Entered By: Lenda Kelp on 05/30/2021 10:58:51 -------------------------------------------------------------------------------- Progress Note Details Patient Name: Date of Service: [name], [name] 05/30/2021 10:30 A M Medical Record Number: [id number] Patient Account Number: [id number] Date of Birth/Sex: Treating RN: Mar 20, 1951 (70 y.o. [name] Primary Care Provider: Nicoletta Ba Other Clinician: Referring Provider: Treating Provider/Extender: Adele Dan in Treatment: 279 Subjective Chief Complaint Information obtained from Patient patient is here for evaluation of venous/lymphedema ulcers History of Present Illness (HPI) Referred by PCP for consultation. Patient has long standing history of BLE venous stasis, no prior ulcerations. At beginning of month, developed cellulitis and weeping. Received IM Rocephin followed by Keflex and resolved. Wears compression stocking, appr 6 months old. Not sure strength. No present drainage. 01/22/16 this is a patient who is a type II diabetic on insulin. 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 He tells me he religiously wears pressure stockings of uncertain strength. He was here with weeping edema about 8 months ago but did not have an open wound. Roughly a month ago he had a reopening on his bilateral legs. He is been using bandages and Neosporin. He does not complain of pain. 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 Last BUN/creatinine I have is from 11/20/15 at 13 and 1.0 respectively 01/29/16; patient arrives today having tolerated the Profore wrap. He brought in his stockings and these are 18 mmHg stockings he bought from [employer]. The compression here is likely inadequate. He does not complain of pain or excessive drainage she has no systemic symptoms. The  wound on the right looks improved as does the one on the left although one on the left is more substantial with still tissue at risk below the actual wound area on the bilateral posterior calf 02/05/16; patient arrives with poor edema control. He states that we did put a 4 layer compression on it last week. No weight appear 5 this. 02/12/16; the area on the posterior right  Has healed. The left Has a substantial wound that has necrotic surface eschar that requires a debridement with a curette. 02/16/16;the patient called or a Nurse visit secondary to increased swelling. He had been in earlier in the week with his right leg healed. He was transitioned to is on pressure stocking on the right leg with the only open wound on the left, a substantial area on the left posterior calf. Note he has a history of severe lower extremity edema, he has a history of chronic atrial fibrillation but not heart failure per my notes but I'll need to research this. He is not complaining of chest pain shortness of breath or orthopnea. The intake nurse noted blisters on the previously closed right leg 02/19/16; this is the patient's regular visit day. I see him on Friday with escalating edema new wounds on the right leg and clear signs of at least right ventricular heart failure. I increased his Lasix to 40 twice a day. He is returning currently in follow-up. States he is noticed a decrease in that the edema 02/26/16 patient's legs have much less edema. There is nothing really open on the right leg. The left leg has improved condition of the large superficial wound on the posterior left leg 03/04/16; edema control is very much better. The patient's right leg wounds have healed. On the left leg he continues to have severe venous inflammation on the posterior aspect of the left leg. There is no tenderness and I don't think any of this is cellulitis. 03/11/16; patient's right leg is married healed and he is in his own stocking. The  patient's left leg has deteriorated somewhat. There is a lot of erythema around the wound on the posterior left leg. There is also a significant rim of erythema posteriorly just above where the wrap would've ended there is a new wound in this location and a lot of tenderness. Can't rule out cellulitis in this area. 03/15/16; patient's right leg remains healed and he is in his own stocking. The patient's left leg is much better than last review. His major wound on the posterior aspect of his left Is almost fully epithelialized. He has 3 small injuries from the wraps. Really. Erythema seems a lot better on antibiotics 03/18/16; right leg remains healed and he is in his own stocking. The patient's left leg is much better. The area on the posterior aspect of the left calf is fully epithelialized. His 3 small injuries which were wrap injuries on the left are improved only one seems still open his erythema has resolved 03/25/16; patient's right leg remains healed and he is in his own stocking. There is no open area today on the left leg posterior leg is completely closed up. His wrap injuries at the superior aspect of his leg are also resolved. He looks as though he has some irritation on the dorsal ankle but this is fully epithelialized without evidence of infection. 03/28/16; we discharged this patient on Monday. Transitioned him into his own stocking. There were problems almost immediately with uncontrolled swelling weeping edema multiple some of which have opened. He does not feel systemically unwell in particular no chest pain no shortness of breath and he does not feel 04/08/16; the edema is under better control with the Profore light wrap but he still has pitting edema. There is one large wound anteriorly 2 on the medial aspect of his left leg and 3 small areas on the superior posterior calf. Drainage is not excessive he is  tolerating a Profore light well 04/15/16; put a Profore wrap on him last week. This  is controlled is edema however he had a lot of pain on his left anterior foot most of his wounds are healed 04/22/16 once again the patient has denuded areas on the left anterior foot which he states are because his wrap slips up word. He saw his primary physician today is on Lasix 40 twice a day and states that he his weight is down 20 pounds over the last 3 months. 04/29/16: Much improved. left anterior foot much improved. He is now on Lasix 80 mg per day. Much improved edema control 05/06/16; I was hoping to be able to discharge him today however once again he has blisters at a low level of where the compression was placed last week mostly on his left lateral but also his left medial leg and a small area on the anterior part of the left foot. 05/09/16; apparently the patient went home after his appointment on 7/4 later in the evening developing pain in his upper medial thigh together with subjective fever and chills although his temperature was not taken. The pain was so intense he felt he would probably have to call 911. However he then remembered that he had leftover doxycycline from a previous round of antibiotics and took these. By the next morning he felt a lot better. He called and spoke to one of our nurses and I approved doxycycline over the phone thinking that this was in relation to the wounds we had previously seen although they were definitely were not. The patient feels a lot better old fever no chills he is still working. Blood sugars are reasonably controlled 05/13/16; patient is back in for review of his cellulitis on his anterior medial upper thigh. He is taking doxycycline this is a lot better. Culture I did of the nodular area on the dorsal aspect of his foot grew MRSA this also looks a lot better. 05/20/16; the patient is cellulitis on the medial upper thigh has resolved. All of his wound areas including the left anterior foot, areas on the medial aspect of the left calf and the lateral  aspect of the calf at all resolved. He has a new blister on the left dorsal foot at the level of the fourth toe this was excised. No evidence of infection 05/27/16; patient continues to complain weeping edema. He has new blisterlike wounds on the left anterior lateral and posterior lateral calf at the top of his wrap levels. The area on his left anterior foot appears better. He is not complaining of fever, pain or pruritus in his feet. 05/30/16; the patient's blisters on his left anterior leg posterior calf all look improved. He did not increase the Lasix 100 mg as I suggested because he was going to run out of his 40 mg tablets. He is still having weeping edema of his toes 06/03/16; I renewed his Lasix at 80 mg once a day as he was about to run out when I last saw him. He is on 80 mg of Lasix now. I have asked him to cut down on the excessive amount of water he was drinking and asked him to drink according to his thirst mechanisms 06/12/2016 -- was seen 2 days ago and was supposed to wear his compression stockings at home but he is developed lymphedema and superficial blisters on the left lower extremity and hence came in for a review 06/24/16; the remaining wound is on his  left anterior leg. He still has edema coming from between his toes. There is lymphedema here however his edema is generally better than when I last saw this. He has a history of atrial fibrillation but does not have a known history of congestive heart failure nevertheless I think he probably has this at least on a diastolic basis. 07/01/16 I reviewed his echocardiogram from January 2017. This was essentially normal. He did not have LVH, EF of 55-60%. His right ventricular function was normal although he did have trivial tricuspid and pulmonic regurgitation. This is not audible on exam however. I increased his Lasix to do massive edema in his legs well above his knees I think in early July. He was also drinking an excessive amount of  water at the time. 07/15/16; missed his appointment last week because of the Labor Day holiday on Monday. He could not get another appointment later in the week. Started to feel the wrap digging in superiorly so we remove the top half and the bottom half of his wrap. He has extensive erythema and blistering superiorly in the left leg. Very tender. Very swollen. Edema in his foot with leaking edema fluid. He has not been systemically unwell 07/22/16; the area on the left leg laterally required some debridement. The medial wounds look more stable. His wrap injury wounds appear to have healed. Edema and his foot is better, weeping edema is also better. He tells me he is meeting with the supplier of the external compression pumps at work 08/05/16; the patient was on vacation last week in [name]. His wrap is been on for an extended period of time. Also over the weekend he developed an extensive area of tender erythema across his anterior medial thigh. He took to doxycycline yesterday that he had leftover from a previous prescription. The patient complains of weeping edema coming out of his toes 08/08/16; I saw this patient on 10/2. He was tender across his anterior thigh. I put him on doxycycline. He returns today in follow-up. He does not have any open wounds on his lower leg, he still has edema weeping into his toes. 08/12/16; patient was seen back urgently today to follow-up for his extensive left thigh cellulitis/erysipelas. He comes back with a lot less swelling and erythema pain is much better. I believe I gave him Augmentin and Cipro. His wrap was cut down as he stated a roll down his legs. He developed blistering above the level of the wrap that remained. He has 2 open blisters and 1 intact. 08/19/16; patient is been doing his primary doctor who is increased his Lasix from 40-80 once a day or 80 already has less edema. Cellulitis has remained improved in the left thigh. 2 open areas on the  posterior left calf 08/26/16; he returns today having new open blisters on the anterior part of his left leg. He has his compression pumps but is not yet been shown how to use some vital representative from the supplier. 09/02/16 patient returns today with no open wounds on the left leg. Some maceration in his plantar toes 09/10/2016 -- Dr. Leanord Hawking had recently discharged him on 09/02/2016 and he has come right back with redness swelling and some open ulcers on his left lower extremity. He says this was caused by trying to apply his compression stockings and he's been unable to use this and has not been able to use his lymphedema pumps. He had some doxycycline leftover and he has started on this a few  days ago. 09/16/16; there are no open wounds on his leg on the left and no evidence of cellulitis. 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 He does not complain of symptoms here. I am not clear use using his external compression pumps. 09/23/16; I have not seen this patient in 2 weeks. He canceled his appointment 10 days ago as he was going on vacation. He tells me that on Monday he noticed a large area on his posterior left leg which is been draining copiously and is reopened into a large wound. He is been using ABDs and the external part of his juxtalite, according to our nurse this was not on properly. 10/07/16; Still a substantial area on the posterior left leg. Using silver alginate 10/14/16; in general better although there is still open area which looks healthy. Still using silver alginate. He reminds me that this happen before he left for [name]. T oday while he was showering in the morning. He had been using his juxtalite's 10/21/16; the area on his posterior left leg is fully epithelialized. However he arrives today with a large area of tender erythema in his medial and posterior left thigh just above the knee. I have marked the  area. Once again he is reluctant to consider hospitalization. I treated him with oral antibiotics in the past for a similar situation with resolution I think with doxycycline however this area it seems more extensive to me. He is not complaining of fever but does have chills and says states he is thirsty. His blood sugar today was in the 140s at home 10/25/16 the area on his posterior left leg is fully epithelialized although there is still some weeping edema. The large area of tenderness and erythema in his medial and posterior left thigh is a lot less tender although there is still a lot of swelling in this thigh. He states he feels a lot better. He is on doxycycline and Augmentin that I started last week. This will continued until Tuesday, December 26. I have ordered a duplex ultrasound of the left thigh rule out DVT whether there is an abscess something that would need to be drained I would also like to know. 11/01/16; he still has weeping edema from a not fully epithelialized area on his left posterior calf. Most of the rest of this looks a lot better. He has completed his antibiotics. His thigh is a lot better. Duplex ultrasound did not show a DVT in the thigh 11/08/16; he comes in today with more Denuded surface epithelium from the posterior aspect of his calf. There is no real evidence of cellulitis. The superior aspect of his wrap appears to have put quite an indentation in his leg just below the knee and this may have contributed. He does not complain of pain or fever. We have been using silver alginate as the primary dressing. The area of cellulitis in the right thigh has totally resolved. He has been using his compression stockings once a week 11/15/16; the patient arrives today with more loss of epithelium from the posterior aspect of his left calf. He now has a fairly substantial wound in this area. The reason behind this deterioration isn't exactly clear although his edema is not well  controlled. He states he feels he is generally more swollen systemically. He is not complaining of chest pain shortness of breath fever. T me he has an appointment with his primary physician in early February.  He is on 80 mg of oral ells Lasix a day. He claims compliance with the external compression pumps. He is not having any pain in his legs similar to what he has with his recurrent cellulitis 11/22/16; the patient arrives a follow-up of his large area on his left lateral calf. This looks somewhat better today. He came in earlier in the week for a dressing change since I saw him a week ago. He is not complaining of any pain no shortness of breath no chest pain 11/28/16; the patient arrives for follow-up of his large area on the left lateral calf this does not look better. In fact it is larger weeping edema. The surface of the wound does not look too bad. We have been using silver alginate although I'm not certain that this is a dressing issue. 12/05/16; again the patient follows up for a large wound on the left lateral and left posterior calf this does not look better. There continues to be weeping edema necrotic surface tissue. More worrisome than this once again there is erythema below the wound involving the distal Achilles and heel suggestive of cellulitis. He is on his feet working most of the day of this is not going well. We are changing his dressing twice a week to facilitate the drainage. 12/12/16; not much change in the overall dimensions of the large area on the left posterior calf. This is very inflamed looking. I gave him an. Doxycycline last week does not really seem to have helped. He found the wrap very painful indeed it seems to of dog into his legs superiorly and perhaps around the heel. He came in early today because the drainage had soaked through his dressings. 12/19/16- patient arrives for follow-up evaluation of his left lower extremity ulcers. He states that he is using his  lymphedema pumps once daily when there is "no drainage". He admits to not using his lipedema pumps while under current treatment. His blood sugars have been consistently between 150-200. 12/26/16; the patient is not using his compression pumps at home because of the wetness on his feet. I've advised him that I think it's important for him to use this daily. He finds his feet too wet, he can put a plastic bag over his legs while he is in the pumps. Otherwise I think will be in a vicious circle. We are using silver alginate to the major area on his left posterior calf 01/02/17; the patient's posterior left leg has further of all into 3 open wounds. All of them covered with a necrotic surface. He claims to be using his compression pumps once a day. His edema control is marginal. Continue with silver alginate 01/10/17; the patient's left posterior leg actually looks somewhat better. There is less edema, less erythema. Still has 3 open areas covered with a necrotic surface requiring debridement. He claims to be using his compression pumps once a day his edema control is better 01/17/17; the patient's left posterior calf look better last week when I saw him and his wrap was changed 2 days ago. He has noted increasing pain in the left heel and arrives today with much larger wounds extensive erythema extending down into the entire heel area especially tender medially. He is not systemically unwell CBGs have been controlled no fever. Our intake nurse showed me limegreen drainage on his AVD pads. 01/24/17; his usual this patient responds nicely to antibiotics last week giving him Levaquin for presumed Pseudomonas. The whole entire posterior part of his  leg is much better much less inflamed and in the case of his Achilles heel area much less tender. He has also had some epithelialization posteriorly there are still open areas here and still draining but overall considerably better 01/31/17- He has continue to tolerate  the compression wraps. he states that he continues to use the lymphedema pumps daily, and can increase to twice daily on the weekends. He is voicing no complaints or concerns regarding his LLE ulcers 02/07/17-he is here for follow-up evaluation. He states that he noted some erythema to the left medial and anterior thigh, which he states is new as of yesterday. He is concerned about recurrent cellulitis. He states his blood sugars have been slightly elevated, this morning in the 180s 02/14/17; he is here for follow-up evaluation. When he was last here there was erythema superiorly from his posterior wound in his anterior thigh. He was prescribed Levaquin however a culture of the wound surface grew MRSA over the phone I changed him to doxycycline on Monday and things seem to be a lot better. 02/24/17; patient missed his appointment on Friday therefore we changed his nurse visit into a physician visit today. Still using silver alginate on the large area of the posterior left thigh. He isn't new area on the dorsal left second toe 03/03/17; actually better today although he admits he has not used his external compression pumps in the last 2 days or so because of work responsibilities over the weekend. 03/10/17; continued improvement. External compression pumps once a day almost all of his wounds have closed on the posterior left calf. Better edema control 03/17/17; in general improved. He still has 3 small open areas on the lateral aspect of his left leg however most of the area on the posterior part of his leg is epithelialized. He has better edema control. He has an ABD pad under his stocking on the right anterior lower leg although he did not let us look at that today. 03/24/17; patient arrives back in clinic today with no open areas however there are areas on the posterior left calf and anterior left calf that are less than 100% epithelialized. His edema is well controlled in the left lower leg. There is some  pitting edema probably lymphedema in the left upper thigh. He uses compression pumps at home once per day. I tried to get him to do this twice a day although he is very reticent. 04/01/2017 -- for the last 2 days he's had significant redness, tenderness and weeping and came in for an urgent visit today. 04/07/17; patient still has 6 more days of doxycycline. He was seen by Dr. Meyer Russel last Wednesday for cellulitis involving the posterior aspect, lateral aspect of his Involving his heel. For the most part he is better there is less erythema and less weeping. He has been on his feet for 12 hours o2 over the weekend. Using his compression pumps once a day 04/14/17 arrives today with continued improvement. Only one area on the posterior left calf that is not fully epithelialized. He has intense bilateral venous inflammation associated with his chronic venous insufficiency disease and secondary lymphedema. We have been using silver alginate to the left posterior calf wound In passing he tells [place] today that the right leg but we have not seen in quite some time has an open area on it but he doesn't want [place] to look at this today states he will show this to [place] next week. 04/21/17; there is no open area  on his left leg although he still reports some weeping edema. He showed [place] his right leg today which is the first time we've seen this leg in a long time. He has a large area of open wound on the right leg anteriorly healthy granulation. Quite a bit of swelling in the right leg and some degree of venous inflammation. He told us about the right leg in passing last week but states that deterioration in the right leg really only happened over the weekend 04/28/17; there is no open area on the left leg although there is an irritated part on the posterior which is like a wrap injury. The wound on the right leg which was new from last week at least to [place] is a lot better. 05/05/17; still no open area on the left leg. Patient  is using his new compression stocking which seems to be doing a good job of controlling the edema. He states he is using his compression pumps once per day. The right leg still has an open wound although it is better in terms of surface area. Required debridement. A lot of pain in the posterior right Achilles marked tenderness. Usually this type of presentation this patient gives concern for an active cellulitis 05/12/17; patient arrives today with his major wound from last week on the right lateral leg somewhat better. Still requiring debridement. He was using his compression stocking on the left leg however that is reopened with superficial wounds anteriorly he did not have an open wound on this leg previously. He is still using his juxta light's once daily at night. He cannot find the time to do this in the morning as he has to be at work by 7 AM 05/19/17; right lateral leg wound looks improved. No debridement required. The concerning area is on the left posterior leg which appears to almost have a subcutaneous hemorrhagic component to it. We've been using silver alginate to all the wounds 05/26/17; the right lateral leg wound continues to look improved. However the area on the left posterior calf is a tightly adherent surface. [place] using silver alginate. Because of the weeping edema in his legs there is very little good alternatives. 06/02/17; the patient left here last week looking quite good. Major wound on the left posterior calf and a small one on the right lateral calf. Both of these look satisfactory. He tells me that by Wednesday he had noted increased pain in the left leg and drainage. He called on Thursday and Friday to get an appointment here but we were blocked. He did not go to urgent care or his primary physician. He thinks he had a fever on Thursday but did not actually take his temperature. He has not been using his compression pumps on the left leg because of pain. I advised him to  go to the emergency room today for IV antibiotics for stents of left leg cellulitis but he has refused I have asked him to take 2 days off work to keep his leg elevated and he has refused this as well. In view of this I'm going to call him and Augmentin and doxycycline. 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 However after a detailed discussion regarding the risk benefits and alternatives  he refuses to get admitted to the hospital. With no other recourse I will continue him on oral antibiotics as before and hopefully he'll have his infectious disease consultation this week. 06/16/2017 -- the patient was seen today by the nurse practitioner at infectious disease Ms. Dixon. 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 He has also been advise Lotrimin cream locally. she also had wise IV antibiotics for his condition if it flares up 06/23/17; patient arrives today with drainage bilaterally although the remaining wound on the left posterior calf after cleaning up today "highlighter yellow drainage" did not look too bad. Unfortunately he has had breakdown on the right anterior leg [previously this leg had not been open and he is using a black stocking] he went to see infectious disease and is been put on clindamycin 150 mg daily, I did not verify the dose although I'm not familiar with using clindamycin in this dosing range, perhaps for prophylaxisoo 06/27/17; I brought this patient back today to follow-up on the wound deterioration on the right lower leg together with surrounding cellulitis.  I started him on doxycycline 4 days ago. This area looks better however he comes in today with intense cellulitis on the medial part of his left thigh. This is not have a wound in this area. Extremely tender. We've been using silver alginate to the wounds on the right lower leg left lower leg with bilateral 4 layer compression he is using his external compression pumps once a day 07/04/17; patient's left medial thigh cellulitis looks better. He has not been using his compression pumps as his insert said it was contraindicated with cellulitis. His right leg continues to make improvements all the wounds are still open. We only have one remaining wound on the left posterior calf. Using silver alginate to all open areas. He is on doxycycline which I started a week ago and should be finishing I gave him Augmentin after Thursday's visit for the severe cellulitis on the left medial thigh which fortunately looks better 07/14/17; the patient's left medial thigh cellulitis has resolved. The cellulitis in his right lower calf on the right also looks better. All of his wounds are stable to improved we've been using silver alginate he has completed the antibiotics I have given him. He has clindamycin 150 mg once a day prescribed by infectious disease for prophylaxis, I've advised him to start this now. We have been using bilateral Unna boots over silver alginate to the wound areas 07/21/17; the patient is been to see infectious disease who noted his recurrent problems with cellulitis. He was not able to tolerate prophylactic clindamycin therefore he is on amoxicillin 500 twice a day. He also had a second daily dose of Lasix added By Dr. Oneta Rack but he is not taking this. Nor is he being completely compliant with his compression pumps a especially not this week. He has 2 remaining wounds one on the right posterior lateral lower leg and one on the left posterior medial lower leg. 07/28/17; maintain on Amoxil 500 twice a  day as prophylaxis for recurrent cellulitis as ordered by infectious disease. The patient has Unna boots bilaterally. Still wounds on his right lateral, left medial, and a new open area on the left anterior lateral lower leg 08/04/17; he remains on amoxicillin twice a day for  prophylaxis of recurrent cellulitis. He has bilateral Unna boots for compression and silver alginate to his wounds. Arrives today with his legs looking as good as I have seen him in quite some time. Not surprisingly his wounds look better as well with improvement on the right lateral leg venous insufficiency wound and also the left medial leg. He is still using the compression pumps once a day 08/11/17; both legs appear to be doing better wounds on the right lateral and left medial legs look better. Skin on the right leg quite good. He is been using silver alginate as the primary dressing. I'm going to use Anasept gel calcium alginate and maintain all the secondary dressings 08/18/17; the patient continues to actually do quite well. The area on his right lateral leg is just about closed the left medial also looks better although it is still moist in this area. His edema is well controlled we have been using Anasept gel with calcium alginate and the usual secondary dressings, 4 layer compression and once daily use of his compression pumps "always been able to manage 09/01/17; the patient continues to do reasonably well in spite of his trip to [place]. The area on the right lateral leg is epithelialized. Left is much better but still open. He has more edema and more chronic erythema on the left leg [venous inflammation] 09/08/17; he arrives today with no open wound on the right lateral leg and decently controlled edema. Unfortunately his left leg is not nearly as in his good situation as last week.he apparently had increasing edema starting on Saturday. He edema soaked through into his foot so used a plastic bag to walk around his  home. The area on the medial right leg which was his open area is about the same however he has lost surface epithelium on the left lateral which is new and he has significant pain in the Achilles area of the left foot. He is already on amoxicillin chronically for prophylaxis of cellulitis in the left leg 09/15/17; he is completed a week of doxycycline and the cellulitis in the left posterior leg and Achilles area is as usual improved. He still has a lot of edema and fluid soaking through his dressings. There is no open wound on the right leg. He saw infectious disease NP today 09/22/17;As usual 1 we transition him from our compression wraps to his stockings things did not go well. He has several small open areas on the right leg. He states this was caused by the compression wrap on his skin although he did not wear this with the stockings over them. He has several superficial areas on the left leg medially laterally posteriorly. He does not have any evidence of active cellulitis especially involving the left Achilles The patient is traveling from [facility] Saturday going to [name]. He states he isn't attempting to get an appointment with a heel objects wound [name] there to change his dressings. I am not completely certain whether this will work 10/06/17; the patient came in on Friday for a nurse visit and the nurse reported that his legs actually look quite good. He arrives in clinic today for his regular follow-up visit. He has a new wound on his left third toe over the PIP probably caused by friction with his footwear. He has small areas on the left leg and a very superficial but epithelialized area on the right anterior lateral lower leg. Other than that his legs look as good as I've seen him  in quite some time. We have been using silver alginate Review of systems; no chest pain no shortness of breath other than this a 10 point review of systems negative 10/20/17; seen by Dr. Meyer Russel last  week. He had taken some antibiotics [doxycycline] that he had left over. Dr. Meyer Russel thought he had candida infection and declined to give him further antibiotics. He has a small wound remaining on the right lateral leg several areas on the left leg including a larger area on the left posterior several left medial and anterior and a small wound on the left lateral. The area on the left dorsal third toe looks a lot better. ROS; Gen.; no fever, respiratory no cough no sputum Cardiac no chest pain other than this 10 point review of system is negative 10/30/17; patient arrives today having fallen in the bathtub 3 days ago. It took him a while to get up. He has pain and maceration in the wounds on his left leg which have deteriorated. He has not been using his pumps he also has some maceration on the right lateral leg. 11/03/17; patient continues to have weeping edema especially in the left leg. This saturates his dressings which were just put on on 12/27. As usual the doxycycline seems to take care of the cellulitis on his lower leg. He is not complaining of fever, chills, or other systemic symptoms. He states his leg feels a lot better on the doxycycline I gave him empirically. He also apparently gets injections at his primary doctor's officeo Rocephin for cellulitis prophylaxis. I didn't ask him about his compression pump compliance today I think that's probably marginal. Arrives in the clinic with all of his dressings primary and secondary macerated full of fluid and he has bilateral edema 11/10/17; the patient's right leg looks some better although there is still a cluster of wounds on the right lateral. The left leg is inflamed with almost circumferential skin loss medially to laterally although we are still maintaining anteriorly. He does not have overt cellulitis there is a lot of drainage. He is not using compression pumps. We have been using silver alginate to the wound areas, there are not a lot of  options here 11/17/17; the patient's right leg continues to be stable although there is still open wounds, better than last week. The inflammation in the left leg is better. Still loss of surface layer epithelium especially posteriorly. There is no overt cellulitis in the amount of edema and his left leg is really quite good, tells me he is using his compression pumps once a day. 11/24/17; patient's right leg has a small superficial wound laterally this continues to improve. The inflammation in the left leg is still improving however we have continuous surface layer epithelial loss posteriorly. There is no overt cellulitis in the amount of edema in both legs is really quite good. He states he is using his compression pumps on the left leg once a day for 5 out of 7 days 12/01/17; very small superficial areas on the right lateral leg continue to improve. Edema control in both legs is better today. He has continued loss of surface epithelialization and left posterior calf although I think this is better. We have been using silver alginate with large number of absorptive secondary dressings 4 layer on the left Unna boot on the right at his request. He tells me he is using his compression pumps once a day 12/08/17; he has no open area on the right leg is edema  control is good here. ooOn the left leg however he has marked erythema and tenderness breakdown of skin. He has what appears to be a wrap injury just distal to the popliteal fossa. 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 There was swelling erythema and breakdown in the right leg. He has much worse skin breakdown in the left leg as well multiple open areas medially and  posteriorly as well as laterally. He tells me he has been using his compression pumps but tells me he feels that the drainage out of his leg is worse when he uses a compression pumps. T be fair to him he is been saying this o for a while however I don't know that I have really been listening to this. I wonder if the compression pumps are working properly 12/22/17;. Once again he arrives with severe erythema, weeping edema from the left greater than right leg. Noncompliance with compression pumps. New this visit he is complaining of pain on the lateral aspect of the right leg and the medial aspect of his right thigh. He apparently saw his cardiologist Dr. [name] who was ordered an echocardiogram area and I think this is a step in the right direction 12/25/17; started his doxycycline Monday night. There is still intense erythema of the right leg especially in the anterior thigh although there is less tenderness. The erythema around the wound on the right lateral calf also is less tender. He still complaining of pain in the left heel. His wounds are about the same right lateral left medial left lateral. Superficial but certainly not close to closure. He denies being systemically unwell no fever chills no abdominal pain no diarrhea 12/29/17; back in follow-up of his extensive right calf and right thigh cellulitis. I added amoxicillin to cover possible doxycycline resistant strep. This seems to of done the trick he is in much less pain there is much less erythema and swelling. He has his echocardiogram at 11:00 this morning. X-ray of the left heel was also negative. 01/05/18; the patient arrived with his edema under much better control. Now that he is retired he is able to use his compression pumps daily and sometimes twice a day per the patient. He has a wound on the right leg the lateral wound looks better. Area on the left leg also looks a lot better. He has no evidence of cellulitis in his bilateral  thighs I had a quick peak at his echocardiogram. He is in normal ejection fraction and normal left ventricular function. He has moderate pulmonary hypertension moderately reduced right ventricular function. One would have to wonder about chronic sleep apnea although he says he doesn't snore. He'll review the echocardiogram with his cardiologist. 01/12/18; the patient arrives with the edema in both legs under exemplary control. He is using his compression pumps daily and sometimes twice daily. His wound on the right lateral leg is just about closed. He still has some weeping areas on the posterior left calf and lateral left calf although everything is just about closed here as well. I have spoken with Aldean Baker who is the patient's nurse practitioner and infectious disease. She was concerned that the patient had not understood that the parenteral penicillin injections he was  receiving for cellulitis prophylaxis was actually benefiting him. I don't think the patient actually saw that I would tend to agree we were certainly dealing with less infections although he had a serious one last month. 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 He is healed. He'll be placed in juxtalite compression wraps and increase his lymphedema pumps to twice daily. We will follow up again next week to ensure there are no issues with the new regiment. 01/20/18-he is here for evaluation of bilateral lower extremity weeping edema. Yesterday he was placed in compression wrap to the right lower extremity and compression stocking to left lower shrubbery. He states he uses lymphedema pumps last night and again this morning and noted a blister to the left lower extremity. On exam he was noted to have drainage to the right lower extremity. He will be placed in Unna boots bilaterally and follow-up next week 01/26/18; patient was actually discharged a week ago to his own juxta light stockings only  to return the next day with bilateral lower extremity weeping edema.he was placed in bilateral Unna boots. He arrives today with pain in the back of his left leg. There is no open area on the right leg however there is a linear/wrap injury on the left leg and weeping edema on the left leg posteriorly. I spoke with infectious disease about 10 days ago. They were disappointed that the patient elected to discontinue prophylactic intramuscular penicillin shots as they felt it was particularly beneficial in reducing the frequency of his cellulitis. I discussed this with the patient today. He does not share this view. He'll definitely need antibiotics today. Finally he is traveling to [place] and trauma leaving this Saturday and returning a week later and he does not travel with his pumps. He is going by car 01/30/18; patient was seen 4 days ago and brought back in today for review of cellulitis in the left leg posteriorly. I put him on amoxicillin this really hasn't helped as much as I might like. He is also worried because he is traveling to [facility] trauma by car. Finally we will be rewrapping him. There is no open area on the right leg over his left leg has multiple weeping areas as usual 02/09/18; The same wrap on for 10 days. He did not pick up the last doxycycline I prescribed for him. He apparently took 4 days worth he already had. There is nothing open on his right leg and the edema control is really quite good. He's had damage in the left leg medially and laterally especially probably related to the prolonged use of Unna boots 02/12/18; the patient arrived in clinic today for a nurse visit/wrap change. He complained of a lot of pain in the left posterior calf. He is taking doxycycline that I previously prescribed for him. Unfortunately even though he used his stockings and apparently used to compression pumps twice a day he has weeping edema coming out of the lateral part of his right leg. This is  coming from the lower anterior lateral skin area. 02/16/18; the patient has finished his doxycycline and will finish the amoxicillin 2 days. The area of cellulitis in the left calf posteriorly has resolved. He is no longer having any pain. He tells me he is using his compression pumps at least once a day sometimes twice. 02/23/18; the patient finished his doxycycline and Amoxil last week. On Friday he noticed a small erythematous circle about the size of a quarter  on the left lower leg just above his ankle. This rapidly expanded and he now has erythema on the lateral and posterior part of the thigh. This is bright red. Also has an area on the dorsal foot just above his toes and a tender area just below the left popliteal fossa. He came off his prophylactic penicillin injections at his own insistence one or 2 months ago. This is obviously deteriorated since then 03/02/18; patient is on doxycycline and Amoxil. Culture I did last week of the weeping area on the back of his left calf grew group B strep. I have therefore renewed the amoxicillin 500 3 times a day for a further week. He has not been systemically unwell. Still complaining of an area of discomfort right under his left popliteal fossa. There is no open wound on the right leg. He tells me that he is using his pumps twice a day on most days 03/09/18; patient arrives in clinic today completing his amoxicillin today. The cellulitis on his left leg is better. Furthermore he tells me that he had intramuscular penicillin shots that his primary care office today. However he also states that the wrap on his right leg fell down shortly after leaving clinic last week. He developed a large blister that was present when he came in for a nurse visit later in the week and then he developed intense discomfort around this area.He tells me he is using his compression pumps 03/16/18; the patient has completed his doxycycline. The infectious part of this/cellulitis in  the left heel area left popliteal area is a lot better. He has 2 open areas on the right calf. Still areas on the left calf but this is a lot better as well. 03/24/18; the patient arrives complaining of pain in the left popliteal area again. He thinks some of this is wrap injury. He has no open area on the right leg and really no open area on the left calf either except for the popliteal area. He claims to be compliant with the compression pumps 03/31/18; I gave him doxycycline last week because of cellulitis in the left popliteal area. This is a lot better although the surface epithelium is denuded off and response to this. He arrives today with uncontrolled edema in the right calf area as well as a fingernail injury in the right lateral calf. There is only a few open areas on the left 04/06/18; I gave him amoxicillin doxycycline over the last 2 weeks that the amoxicillin should be completing currently. He is not complaining of any pain or systemic symptoms. The only open areas see has is on the right lateral lower leg paradoxically I cannot see anything on the left lower leg. He tells me he is using his compression pumps twice a day on most days. Silver alginate to the wounds that are open under 4 layer compression 04/13/18; he completed antibiotics and has no new complaints. Using his compression pumps. Silver alginate that anything that's opened 04/20/18; he is using his compression pumps religiously. Silver alginate 4 layer compression anything that's opened. He comes in today with no open wounds on the left leg but 3 on the right including a new one posteriorly. He has 2 on the right lateral and one on the right posterior. He likes Unna boots on the right leg for reasons that aren't really clear we had the usual 4 layer compression on the left. It may be necessary to move to the 4 layer compression on the right  however for now I left them in the Unna boots 04/27/18; he is using his compression pumps at  least once a day. He has still the wounds on the right lateral calf. The area right posteriorly has closed. He does not have an open wound on the left under 4 layer compression however on the dorsal left foot just proximal to the toes and the left third toe 2 small open areas were identified 05/11/18; he has not uses compression pumps. The areas on the right lateral calf have coalesced into one large wound necrotic surface. On the left side he has one small wound anteriorly however the edema is now weeping out of a large part of his left leg. He says he wasn't using his pumps because of the weeping fluid. I explained to him that this is the time he needs to pump more 05/18/18; patient states he is using his compression pumps twice a day. The area on the right lateral large wound albeit superficial. On the left side he has innumerable number of small new wounds on the left calf particularly laterally but several anteriorly and medially. All these appear to have healthy granulated base these look like the remnants of blisters however they occurred under compression. The patient arrives in clinic today with his legs somewhat better. There is certainly less edema, less multiple open areas on the left calf and the right anterior leg looks somewhat better as well superficial and a little smaller. However he relates pain and erythema over the last 3-4 days in the thigh and I looked at this today. He has not been systemically unwell no fever no chills no change in blood sugar values 05/25/18; comes in today in a better state. The severe cellulitis on his left leg seems better with the Keflex. Not as tender. He has not been systemically unwell ooHard to find an open wound on the left lower leg using his compression pumps twice a day ooThe confluent wounds on his right lateral calf somewhat better looking. These will ultimately need debridement I didn't do this today. 06/01/18; the severe cellulitis on the left  anterior thigh has resolved and he is completed his Keflex. ooThere is no open wound on the left leg however there is a superficial excoriation at the base of the third toe dorsally. Skin on the bottom of his left foot is macerated looking. ooThe left the wounds on the lateral right leg actually looks some better although he did require debridement of the top half of this wound area with an open curet 06/09/18 on evaluation today patient appears to be doing poorly in regard to his right lower extremity in particular this appears to likely be infected he has very thick purulent discharge along with a bright green tent to the discharge. This makes me concerned about the possibility of pseudomonas. He's also having increased discomfort at this point on evaluation. Fortunately there does not appear to be any evidence of infection spreading to the other location at this time. 06/16/18 on evaluation today patient appears to actually be doing fairly well. His ulcer has actually diminished in size quite significantly at this point which is good news. Nonetheless he still does have some evidence of infection he did see infectious disease this morning before coming here for his appointment. I did review the results of their evaluation and their note today. They did actually have him discontinue the Cipro and initiate treatment with linezolid at this time. He is doing this for the  next seven days and they recommended a follow-up in four months with them. He is the keep a log of the need for intermittent antibiotic therapy between now and when he falls back up with infectious disease. This will help them gaze what exactly they need to do to try and help them out. 06/23/18; the patient arrives today with no open wounds on the left leg and left third toe healed. He is been using his compression pumps twice a day. On the right lateral leg he still has a sizable wound but this is a lot better than last time I saw this.  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 Has been using silver alginate under 4 layer compression 06/30/18; the only open wound he has is on the right lateral leg and this looks healthy. No debridement is required. We have been using silver alginate. He does not have an open wound on the left leg. There is apparently some drainage from the dorsal proximal third toe on the left although I see no open wound here. 07/03/18 on evaluation today patient was actually here just for a nurse visit rapid change. However when he was here on Wednesday for his rat change due to having been healed on the left and then developing blisters we initiated the wrap again knowing that he would be back today for [place] to reevaluate and see were at. Unfortunately he has developed some cellulitis into the proximal portion of his right lower extremity even into the region of his thigh. 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 He was placed on one as what at that point. Nonetheless he is done with that and has been tolerating it well otherwise. Doxycycline which in the past really did not seem to be effective for him. Nonetheless I think the best option may be for [place] to definitely reinitiate the antibiotics for a longer period of time. 07/07/18; since I last saw this patient a week ago he has had a difficult time. At that point he did not have an open wound on his left leg. We transitioned him into juxta light stockings. He was apparently in the clinic the next day with blisters on the left lateral and left medial lower calf. He also had weeping edema fluid. He was put back into a compression wrap. He was also in the clinic on Friday with intense erythema in his right thigh. Per the patient he was started on Bactrim however that didn't work at all in terms of relieving his pain and  swelling. He has taken 3 doxycycline that he had left over from last time and that seems to of helped. He has blistering on the right thigh as well. 07/14/18; the erythema on his right thigh has gotten better with doxycycline that he is finishing. 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 Presumably this cellulitis in the thigh was not related to that although I think this is a bit concerning going forward. He still has an area on the right lateral calf the blister on the right medial calf just below the knee that was discussed above. On the left 2 small open areas left medial and left lateral. Edema control is adequate. He is using his compression pumps twice a day 07/20/18;  continued improvement in the condition of both legs especially the edema in his bilateral thighs. He tells me he is been losing weight through a combination of diet and exercise. He is using his compression pumps twice a day. So overall she made to the remaining wounds 07/27/2018; continued improvement in condition of both legs. His edema is well controlled. The area on the right lateral leg is just about closed he had one blisters show up on the medial left upper calf. We have him in 4 layer compression. He is going on a 10-day trip to [place] and [place]. He will be driving. He wants to wear Unna boots because of the lessening amount of constriction. He will not use compression pumps while he is away 08/05/18 on evaluation today patient actually appears to be doing decently well all things considered in regard to his bilateral lower extremities. The worst ulcer is actually only posterior aspect of his left lower extremity with a four layer compression wrap cut into his leg a couple weeks back. He did have a trip and actually had [employer] for the trip that he is worn since he was last here. Nonetheless he feels like the [employer]  actually do better for him his swelling is up a little bit but he also with his trip was not taking his Lasix on a regular set schedule like he was supposed to be. He states that obviously the reason being that he cannot drive and keep going without having to urinate too frequently which makes it difficult. He did not have his pumps with him while he was away either which I think also maybe playing a role here too. 08/13/2018; the patient only has a small open wound on the right lateral calf which is a big improvement in the last month or 2. He also has the area posteriorly just below the posterior fossa on the left which I think was a wrap injury from several weeks ago. He has no current evidence of cellulitis. He tells me he is back into his compression pumps twice a day. He also tells me that while he was at the laundromat somebody stole a section of his extremitease stockings 08/20/2018; back in the clinic with a much improved state. He only has small areas on the right lateral mid calf which is just about healed. This was is more substantial area for quite a prolonged period of time. He has a small open area on the left anterior tibia. The area on the posterior calf just below the popliteal fossa is closed today. He is using his compression pumps twice a day 08/28/2018; patient has no open wound on the right leg. He has a smattering of open areas on the calf with some weeping lymphedema. More problematically than that it looks as though his wraps of slipped down in his usual he has very angry upper area of edema just below the right medial knee and on the right lateral calf. He has no open area on his feet. The patient is traveling to [name] next week. I will send him in an antibiotic. We will continue to wrap the right leg. We ordered extremitease stockings for him last week and I plan to transition the right leg to a stocking when he gets home which will be in 10 days time. As usual he is  very reluctant to take his pumps with him when he travels 09/07/2018; patient returns from [facility]. He  shows me a picture of his left leg in the mid part of his trip last week with intense fire engine erythema. The picture look bad enough I would have considered sending him to the hospital. Instead he went to the [facility] care [name] in [facility]. They did not prescribe him antibiotics but he did take some doxycycline he had leftover from a previous visit. I had given him trimethoprim sulfamethoxazole before he left this did not work according to the patient. This is resulted in some improvement fortunately. He comes back with a large wound on the left posterior calf. Smaller area on the left anterior tibia. Denuded blisters on the dorsal left foot over his toes. Does not have much in the way of wounds on the right leg although he does have a very tender area on the right posterior area just below the popliteal fossa also suggestive of infection. He promises me he is back on his pumps twice a day 09/15/2018; the intense cellulitis in his left lower calf is a lot better. The wound area on the posterior left calf is also so better. However he has reasonably extensive wounds on the dorsal aspect of his second and third toes and the proximal foot just at the base of the toes. There is nothing open on the right leg 09/22/2018; the patient has excellent edema control in his legs bilaterally. He is using his external compression pumps twice a day. He has no open area on the right leg and only the areas in the left foot dorsally second and third toe area on the left side. He does not have any signs of active cellulitis. 10/06/2018; the patient has good edema control bilaterally. He has no open wound on the right leg. There is a blister in the posterior aspect of his left calf that we had to deal with today. He is using his compression pumps twice a day. There is no signs of active cellulitis. We  have been using silver alginate to the wound areas. He still has vulnerable areas on the base of his left first second toes dorsally He has a his extremities stockings and we are going to transition him today into the stocking on the right leg. He is cautioned that he will need to continue to use the compression pumps twice a day. If he notices uncontrolled edema in the right leg he may need to go to 3 times a day. 10/13/2018; the patient came in for a nurse check on Friday he has a large flaccid blister on the right medial calf just below the knee. We unroofed this. He has this and a new area underneath the posterior mid calf which was undoubtedly a blister as well. He also has several small areas on the right which is the area we put his extremities stocking on. 10/19/2018; the patient went to see infectious disease this morning I am not sure if that was a routine follow-up in any case the doxycycline I had given him was discontinued and started on linezolid. He has not started this. It is easy to look at his left calf and the inflammation and think this is cellulitis however he is very tender in the tissue just below the popliteal fossa and I have no doubt that there is infection going on here. He states the problem he is having is that with the compression pumps the edema goes down and then starts walking the wrap falls down. We will see if we can adhere  this. He has 1 or 2 minuscule open areas on the right still areas that are weeping on the posterior left calf, the base of his left second and third toes 10/26/18; back today in clinic with quite of skin breakdown in his left anterior leg. This may have been infection the area below the popliteal fossa seems a lot better however tremendous epithelial loss on the left anterior mid tibia area over quite inexpensive tissue. He has 2 blisters on the right side but no other open wound here. 10/29/2018; came in urgently to see [place] today and we worked him  in for review. He states that the 4 layer compression on the right leg caused pain he had to cut it down to roughly his mid calf this caused swelling above the wrap and he has blisters and skin breakdown today. As a result of the pain he has not been using his pumps. Both legs are a lot more edematous and there is a lot of weeping fluid. 11/02/18; arrives in clinic with continued difficulties in the right leg> left. Leg is swollen and painful. multiple skin blisters and new open areas especially laterally. He has not been using his pumps on the right leg. 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 He is not systemically unwell. 11/09/2018; the patient claims he is being compliant with his pumps. He is finished the doxycycline I gave him last week. Culture I did of the wound on the right lateral leg showed a few very resistant methicillin staph aureus. This was resistant to doxycycline. Nevertheless he states the pain in the leg is a lot better which makes me wonder if the cultured organism was not really what was causing the problem nevertheless this is a very dangerous organism to be culturing out of any wound. His right leg is still a lot larger than the left. He is using an Radio broadcast assistant on this area, he blames a 4-layer compression for causing the original skin breakdown which I doubt is true however I cannot talk him out of it. We have been using silver alginate to all of these areas which were initially blisters 11/16/2018; patient is being compliant with his external compression pumps at twice a day. Miraculously he arrives in clinic today with absolutely no open wounds. He has better edema control on the left where he has been using 4 layer compression versus wound of wounds on the right and I pointed this out to him. There is no inflammation in the skin in his lower legs which is also somewhat unusual for him. There is no open wounds on the dorsal left  foot. He has extremitease stockings at home and I have asked him to bring these in next week. 11/25/18 patient's lower extremity on examination today on the left appears for the most part to be wound free. He does have an open wound on the lateral aspect of the right lower extremity but this is minimal compared to what I've seen in past. He does request that we go ahead and wrap the left leg as well even though there's nothing open just so hopefully it will not reopen in short order. 1/28; patient has superficial open wounds on the right lateral calf left anterior calf and left posterior calf. His edema control is adequate. 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 We have been using silver alginate  as the primary dressing. He claims compliance with his compression pumps 2/4; patient has superficial open wounds on numerous areas of his left calf and again one on the left dorsal foot. The areas on the right lateral calf have healed. The cellulitis that I gave him doxycycline for last week is also resolved this was mostly on the left anterior calf just below the tibial tuberosity. His edema looks fairly well-controlled. He tells me he went to see his primary doctor today and had blood work ordered 2/11; once again he has several open areas on the left calf left tibial area. Most of these are small and appear to have healthy granulation. He does not have anything open on the right. The edema and control in his thighs is pretty good which is usually a good indication he has been using his pumps as requested. 2/18; he continues to have several small areas on the left calf and left tibial area. Most of these are small healthy granulation. We put him in his stocking on the right leg last week and he arrives with a superficial open area over the right upper tibia and a fairly large area on the right lateral tibia in similar  condition. His edema control actually does not look too bad, he claims to be using his compression pumps twice a day 2/25. Continued small areas on the left calf and left tibial area. New areas especially on the right are identified just below the tibial tuberosity and on the right upper tibia itself. There are also areas of weeping edema fluid even without an obvious wound. He does not have a considerable degree of lymphedema but clearly there is more edema here than his skin can handle. He states he is using the pumps twice a day. We have an Unna boot on the right and 4 layer compression on the left. 3/3; he continues to have an area on the right lateral calf and right posterior calf just below the popliteal fossa. There is a fair amount of tenderness around the wound on the popliteal fossa but I did not see any evidence of cellulitis, could just be that the wrap came down and rubbed in this area. ooHe does not have an open area on the left leg however there is an area on the left dorsal foot at the base of the third toe ooWe have been using silver alginate to all wound areas 3/10; he did not have an open area on his left leg last time he was here a week ago. T oday he arrives with a horizontal wound just below the tibial tuberosity and an area on the left lateral calf. He has intense erythema and tenderness in this area. The area is on the right lateral calf and right posterior calf better than last week. We have been using silver alginate as usual 3/18 - Patient returns with 3 small open areas on left calf, and 1 small open area on right calf, the skin looks ok with no significant erythema, he continues the UNA boot on right and 4 layer compression on left. The right lateral calf wound is closed , the right posterior is small area. we will continue silver alginate to the areas. 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 He is been tolerating the dressing changes without  complication. Fortunately she has made excellent improvement in regard to the overall status of his wounds. Unfortunately every time we cease wrapping him he ends up reopening in causing more significant issues at that point. Again I'm unsure of the best direction to take although I think the lymphedema clinic may be appropriate for him. 02/03/19 on evaluation today patient appears to be doing well in regard to the wounds that we saw him for last week unfortunately he has a new area on the proximal portion of his right medial/posterior lower extremity where the wrap somewhat slowed down and caused swelling and a blister to rub and open. Unfortunately this is the only opening that he has on either leg at this point. 02/17/19 on evaluation today patient's bilateral lower extremities appear to be doing well. He still completely healed in regard to the left lower extremity. In regard to the right lower extremity the area where the wrap and slid down and caused the blister still seems to be slightly open although this is dramatically better than during the last evaluation two weeks ago. I'm very pleased with the way this stands overall. 03/03/19 on evaluation today patient appears to be doing well in regard to his right lower extremity in general although he did have a new blister open this does not appear to be showing any evidence of active infection at this time. Fortunately there's No fevers, chills, nausea, or vomiting noted at this time. Overall I feel like he is making good progress it does feel like that the right leg will we perform the [employer] seems to do with a bit better than three layer wrap on the left which slid down on him. We may switch to doing bilateral in the book wraps. 5/4; I have not seen Mr. Arcilla in quite  some time. According to our case manager he did not have an open wound on his left leg last week. He had 1 remaining wound on the right posterior medial calf. He arrives today with multiple openings on the left leg probably were blisters and/or wrap injuries from Unna boots. I do not think the Unna boot's will provide adequate compression on the left. I am also not clear about the frequency he is using the compression pumps. 03/17/19 on evaluation today patient appears to be doing excellent in regard to his lower extremities compared to last week's evaluation apparently. He had gotten significantly worse last week which is unfortunate. The [employer] wrap on the left did not seem to do very well for him at all and in fact it didn't control his swelling significantly enough he had an additional outbreak. Subsequently we go back to the four layer compression wrap on the left. This is good news. At least in that he is doing better and the wound seem to be killing him. He still has not heard anything from the lymphedema clinic. 03/24/19 on evaluation today patient actually appears to be doing much better in regard to his bilateral lower Kevin as compared to last week when I saw him. Fortunately there's no signs of active infection at this time. He has been tolerating the dressing changes without complication. Overall I'm extremely pleased with the progress and appearance in general. 04/07/19 on evaluation today patient appears to be doing well in regard to his bilateral lower extremities. His swelling is significantly down from where it was previous. With that being said he does have a couple blisters still open at this point but fortunately nothing  that seems to be too severe and again the majority of the larger openings has healed at this time. 04/14/19 on evaluation today patient actually appears to be doing quite well in regard to his bilateral lower extremities in fact I'm not even sure there's  anything significantly open at this time at any site. Nonetheless he did have some trouble with these wraps where they are somewhat irritating him secondary to the fact that he has noted that the graph wasn't too close down to the end of this foot in a little bit short as well up to his knee. Otherwise things seem to be doing quite well. 04/21/19 upon evaluation today patient's wound bed actually showed evidence of being completely healed in regard to both lower extremities which is excellent news. There does not appear to be any signs of active infection which is also good news. I'm very pleased in this regard. No fevers, chills, nausea, or vomiting noted at this time. 04/28/19 on evaluation today patient appears to be doing a little bit worse in regard to both lower extremities on the left mainly due to the fact that when he went infection disease the wrap was not wrapped quite high enough he developed a blister above this. On the right he is a small open area of nothing too significant but again this is continuing to give him some trouble he has been were in the Velcro compression that he has at home. 05/05/19 upon evaluation today patient appears to be doing better with regard to his lower Kevin ulcers. He's been tolerating the dressing changes without complication. Fortunately there's no signs of active infection at this time. No fevers, chills, nausea, or vomiting noted at this time. We have been trying to get an appointment with her lymphedema clinic in [name] but unfortunately nobody can get them on phone with not been able to even fax information over the patient likewise is not been able to get in touch with them. Overall I'm not sure exactly what's going on here with to reach out again today. 05/12/19 on evaluation today patient actually appears to be doing about the same in regard to his bilateral lower Kevin ulcers. Still having a lot of drainage unfortunately. He tells  me especially in the left but even on the right. There's no signs of active infection which is good news we've been using so ratcheted up to this point. 05/19/19 on evaluation today patient actually appears to be doing quite well with regard to his left lower extremity which is great news. Fortunately in regard to the right lower extremity has an issues with his wrap and he subsequently did remove this from what I'm understanding. Nonetheless long story short is what he had rewrapped once he removed it subsequently had maggots underneath this wrap whenever he came in for evaluation today. With that being said they were obviously completely cleaned away by the nursing staff. The visit today which is excellent news. However he does appear to potentially have some infection around the right ankle region where the maggots were located as well. He will likely require anabiotic therapy today. 05/26/19 on evaluation today patient actually appears to be doing much better in regard to his bilateral lower extremities. I feel like the infection is under much better control. With that being said there were maggots noted when the wrap was removed yet again today. Again this could have potentially been left over from previous although at this time there does not appear  to be any signs of significant drainage there was obviously on the wrap some drainage as well this contracted gnats or otherwise. Either way I do not see anything that appears to be doing worse in my pinion and in fact I think his drainage has slowed down quite significantly likely mainly due to the fact to his infection being under better control. 06/02/2019 on evaluation today patient actually appears to be doing well with regard to his bilateral lower extremities there is no signs of active infection at this time which is great news. With that being said he does have several open areas more so on the right than the left but nonetheless these are all  significantly better than previously noted. 06/09/2019 on evaluation today patient actually appears to be doing well. His wrap stayed up and he did not cause any problems he had more drainage on the right compared to the left but overall I do not see any major issues at this time which is great news. 06/16/2019 on evaluation today patient appears to be doing excellent with regard to his lower extremities the only area that is open is a new blister that can have opened as of today on the medial ankle on the left. Other than this he really seems to be doing great I see no major issues at this point. 06/23/2019 on evaluation today patient appears to be doing quite well with regard to his bilateral lower extremities. In fact he actually appears to be almost completely healed there is a small area of weeping noted of the right lower extremity just above the ankle. Nonetheless fortunately there is no signs of active infection at this time which is good news. No fevers, chills, nausea, vomiting, or diarrhea. 8/24; the patient arrived for a nurse visit today but complained of very significant pain in the left leg and therefore I was asked to look at this. Noted that he did not have an open area on the left leg last week nevertheless this was wrapped. The patient states that he is not been able to put his compression pumps on the left leg because of the discomfort. He has not been systemically unwell 06/30/2019 on evaluation today patient unfortunately despite being excellent last week is doing much worse with regard to his left lower extremity today. In fact he had to come in for a nurse on Monday where his left leg had to be rewrapped due to excessive weeping Dr. Leanord Hawking placed him on doxycycline at that point. Fortunately there is no signs of active infection Systemically at this time which is good news. 07/07/2019 in regard to the patient's wounds today he actually seems to be doing well with his right lower  extremity there really is nothing open or draining at this point this is great news. Unfortunately the left lower extremity is given him additional trouble at this time. There does not appear to be any signs of active infection nonetheless he does have a lot of edema and swelling noted at this point as well as blistering all of which has led to a much more poor appearing leg at this time compared to where it was 2 weeks ago when it was almost completely healed. Obviously this is a little discouraging for the patient. He is try to contact the lymphedema clinic in [place] he has not been able to get through to them. 07/14/2019 on evaluation today patient actually appears to be doing slightly better with regard to his left lower extremity  ulcers. Overall I do feel like at least at the top of the wrap that we have been placing this area has healed quite nicely and looks much better. The remainder of the leg is showing signs of improvement. Unfortunately in the thigh area he still has an open region on the left and again on the right he has been utilizing just a Band-Aid on an area that also opened on the thigh. Again this is an area that were not able to wrap although we did do an Ace wrap to provide some compression that something that obviously is a little less effective than the compression wraps we have been using on the lower portion of the leg. He does have an appointment with the lymphedema clinic in [facility] on Friday. 07/21/2019 on evaluation today patient appears to be doing better with regard to his lower extremity ulcers. He has been tolerating the dressing changes without complication. Fortunately there is no signs of active infection at this time. No fevers, chills, nausea, vomiting, or diarrhea. I did receive the paperwork from the physical therapist at the lymphedema clinic in [place]. Subsequently I signed off on that this morning and sent that back to him for  further progression with the treatment plan. 07/28/2019 on evaluation today patient appears to be doing very well with regard to his right lower extremity where I do not see any open wounds at this point. Fortunately he is feeling great as far as that is concerned as well. In regard to the left lower extremity he has been having issues with still several areas of weeping and edema although the upper leg is doing better his lower leg still I think is going require the compression wrap at this time. No fevers, chills, nausea, vomiting, or diarrhea. 08/04/2019 on evaluation today patient unfortunately is having new wounds on the right lower extremity. Again we have been using Unna boot wrap on that side. We switched him to using his juxta lite wrap at home. With that being said he tells me he has been using it although his legs extremely swollen and to be honest really does not appear that he has been. I cannot know that for sure however. Nonetheless he has multiple new wounds on the right lower extremity at this time. Obviously we will have to see about getting this rewrapped for him today. 08/11/2019 on evaluation today patient appears to be doing fairly well with regard to his wounds. He has been tolerating the dressing changes including the compression wraps without complication. He still has a lot of edema in his upper thigh regions bilaterally he is supposed to be seeing the lymphedema clinic on the 15th of this month once his wraps arrive for the upper part of his legs. 08/18/2019 on evaluation today patient appears to be doing well with regard to his bilateral lower extremities at this point. He has been tolerating the dressing changes without complication. Fortunately there is no signs of active infection which is also good news. He does have a couple weeping areas on the first and second toe of the right foot he also has just a small area on the left foot upper leg and a small area on the left  lower leg but overall he is doing quite well in my opinion. He is supposed to be getting his wraps shortly in fact tomorrow and then subsequently is seeing the lymphedema clinic next Wednesday on the 21st. Of note he is also leaving on the 25th to  go on vacation for a week to the beach. For that reason and since there is some uncertainty about what there can be doing at lymphedema clinic next Wednesday I am get a make an appointment for next Friday here for [place] to see what we need to do for him prior to him leaving for vacation. 10/23; patient arrives in considerable pain predominantly in the upper posterior calf just distal to the popliteal fossa also in the wound anteriorly above the major wound. This is probably cellulitis and he has had this recurrently in the past. He has no open wound on the right side and he has had an Radio broadcast assistant in that area. Finally I note that he has an area on the left posterior calf which by enlarge is mostly epithelialized. This protrudes beyond the borders of the surrounding skin in the setting of dry scaly skin and lymphedema. The patient is leaving for [facility] on Sunday. Per his longstanding pattern, he will not take his compression pumps with him predominantly out of fear that they will be stolen. He therefore asked that we put a Unna boot back on the right leg. He will also contact the wound care [name] in [facility] to see if they can change his dressing in the mid week. 11/3; patient returned from his vacation to [name]. He was seen on 1 occasion at their wound care [name]. They did a 2 layer compression system as they did not have our 4-layer wrap. I am not completely certain what they put on the wounds. They did not change the Unna boot on the right. The patient is also seeing a lymphedema specialist physical therapist in [place]. It appears that he has some compression sleeve for his thighs which indeed look quite a bit better than I am used to  seeing. He pumps over these with his external compression pumps. 11/10; the patient has a new wound on the right medial thigh otherwise there is no open areas on the right. He has an area on the left leg posteriorly anteriorly and medially and an area over the left second toe. We have been using silver alginate. He thinks the injury on his thigh is secondary to friction from the compression sleeve he has. 11/17; the patient has a new wound on the right medial thigh last week. He thinks this is because he did not have a underlying stocking for his thigh juxta lite apparatus. He now has this. The area is fairly large and somewhat angry but I do not think he has underlying cellulitis. ooHe has a intact blister on the right anterior tibial area. ooSmall wound on the right great toe dorsally ooSmall area on the medial left calf. 11/30; the patient does not have any open areas on his right leg and we did not take his juxta lite stocking off. However he states that on Friday his compression wrap fell down lodging around his upper mid calf area. As usual this creates a lot of problems for him. He called urgently today to be seen for a nurse visit however the nurse visit turned into a provider visit because of extreme erythema and pain in the left anterior tibia extending laterally and posteriorly. The area that is problematic is extensive 10/06/2019 upon evaluation today patient actually appears to be doing poorly in regard to his left lower extremity. He Dr. Leanord Hawking did place him on doxycycline this past Monday apparently due to the fact that he was doing much worse in  regard to this left leg. Fortunately the doxycycline does seem to be helping. Unfortunately we are still having a very difficult time getting his edema under any type of control in order to anticipate discharge at some point. The only way were really able to control his lymphedema really is with compression wraps and that has only even  seemingly temporary. He has been seeing a lymphedema clinic they are trying to help in this regard but still this has been somewhat frustrating in general for the patient. 10/13/19 on evaluation today patient appears to be doing excellent with regard to his right lower extremity as far as the wounds are concerned. His swelling is still quite extensive unfortunately. He is still having a lot of drainage from the thigh areas bilaterally which is unfortunate. He's been going to lymphedema clinic but again he still really does not have this edema under control as far as his lower extremities are concern. With regard to his left lower extremity this seems to be improving and I do believe the doxycycline has been of benefit for him. He is about to complete the doxycycline. 10/20/2019 on evaluation today patient appears to be doing poorly in regard to his bilateral lower extremities. More in the right thigh he has a lot of irritation at this site unfortunately. In regard to the left lower extremity the wrap was not quite as high it appears and does seem to have caused him some trouble as well. Fortunately there is no evidence of systemic infection though he does have some blue-green drainage which has me concerned for the possibility of Pseudomonas. He tells me he is previously taking Cipro without complications and he really does not care for Levaquin however due to some of the side effects he has. He is not allergic to any medications specifically antibiotics that were aware of. 10/27/2019 on evaluation today patient actually does appear to be for the most part doing better when compared to last week's evaluation. With that being said he still has multiple open wounds over the bilateral lower extremities. He actually forgot to start taking the Cipro and states that he still has the whole bottle. He does have several new blisters on left lower extremity today I think I would recommend he go ahead and take the  Cipro based on what I am seeing at this point. 12/30-Patient comes at 1 week visit, 4 layer compression wraps on the left and Unna boot on the right, primary dressing Xtrasorb and silver alginate. Patient is taking his Cipro and has a few more days left probably 5-6, and the legs are doing better. He states he is using his compressions devices which I believe he has 11/10/2019 on evaluation today patient actually appears to be much better than last time I saw him 2 weeks ago. His wounds are significantly improved and overall I am very pleased in this regard. Fortunately there is no signs of active infection at this time. He is just a couple of days away from completing Cipro. Overall his edema is much better he has been using his lymphedema pumps which I think is also helping at this point. 11/17/2019 on evaluation today patient appears to be doing excellent in regard to his wounds in general. His legs are swollen but not nearly as much as they have been in the past. Fortunately he is tolerating the compression wraps without complication. No fevers, chills, nausea, vomiting, or diarrhea. He does have some erythema however in the distal portion of his  right lower extremity specifically around the forefoot and toes there is a little bit of warmth here as well. 11/24/2019 on evaluation today patient appears to be doing well with regard to his right lower extremity I really do not see any open wounds at this point. His left lower extremity does have several open areas and his right medial thigh also is open. Other than this however overall the patient seems to be making good progress and I am very pleased at this point. 12/01/2019 on evaluation today patient appears to be doing poorly at this point in regard to his left lower extremity has several new blisters despite the fact that we have him in compression wraps. In fact he had a 4-layer compression wrap, his upper thigh wrapped from lymphedema clinic, and a  juxta light over top of the 4 layer compression wrap the lymphedema clinic applied and despite all this he still develop blisters underneath. Obviously this does have me concerned about the fact that unfortunately despite what we are doing to try to get wounds healed he continues to have new areas arise I do not think he is ever good to be at the point where he can realistically just use wraps at home to keep things under control. Typically when we heal him it takes about 1-2 days before he is back in the clinic with severe breakdown and blistering of his lower extremities bilaterally. This is happened numerous times in the past. Unfortunately I think that we may need some help as far as overall fluid overload to kind of limit what we are seeing and get things under better control. 12/08/2019 on evaluation today patient presents for follow-up concerning his ongoing bilateral lower extremity edema. Unfortunately he is still having quite a bit of swelling the compression wraps are controlling this to some degree but he did see Dr. [name] his cardiologist I do have that available for review today as far as the appointment was concerned that was on 12/06/2019. Obviously that she has been 2 days ago. The patient states that he is only been taking the Lasix 80 mg 1 time a day he had told me previously he was taking this twice a day. Nonetheless Dr. [name] recommended this be up to 80 mg 2 times a day for the patient as he did appear to be fluid overloaded. With that being said the patient states he did this yesterday and he was unable to go anywhere or do anything due to the fact that he was constantly having to urinate. Nonetheless I think that this is still good to be something that is important for him as far as trying to get his edema under control at all things that he is going to be able to just expect his wounds to get under control and things to be better without going through at least a period of time  where he is trying to stabilize his fluid management in general and I think increasing the Lasix is likely the first step here. It was also mentioned the possibility that the patient may require metolazone. With that being said he wanted to have the patient take Lasix twice a day first and then reevaluating 2 months to see where things stand. 12/15/2019 upon evaluation today patient appears to be doing regard to his legs although his toes are showing some signs of weeping especially on the left at this point to some degree on the right. There does not appear to be any signs of  active infection and overall I do feel like the compression wraps are doing well for him but he has not been able to take the Lasix at home and the increased dose that Dr. [name] recommended. He tells me that just not go to be feasible for him. Nonetheless I think in this case he should probably send a message to Dr. [name] in order to discuss options from the standpoint of possible admission to get the fluid off or otherwise going forward. 12/22/2019 upon evaluation today patient appears to be doing fairly well with regard to his lower extremities at this point. In fact he would be doing excellent if it was not for the fact that his right anterior thigh apparently had an allergic reaction to adhesive tape that he used. The wound itself that we have been monitoring actually appears to be healed. There is a lot of irritation at this point. 12/29/2019 upon evaluation today patient appears to be doing well in regard to his lower extremities. His left medial thigh is open and somewhat draining today but this is the only region that is open the right has done much better with the treatment utilizing the steroid cream that I prescribed for him last week. Overall I am pleased in that regard. Fortunately there is no signs of active infection at this time. No fevers, chills, nausea, vomiting, or diarrhea. 01/05/2020 upon evaluation today patient  appears to be doing more poorly in regard to his right lower extremity at this point upon evaluation today. Unfortunately he continues to have issues in this regard and I think the biggest issue is controlling his edema. This obviously is not very well controlled at this point is been recommended that he use the Lasix twice a day but he has not been able to do that. Unfortunately I think this is leading to an issue where honestly he is not really able to effectively control his edema and therefore the wounds really are not doing significantly better. I do not think that he is going to be able to keep things under good control unless he is able to control his edema much better. I discussed this again in great detail with him today. 01/12/2020 good news is patient actually appears to be doing quite well today at this point. He does have an appointment with lymphedema clinic tomorrow. His legs appear healed and the toe on the left is almost completely healed. In general I am very pleased with how things stand at this point. 01/19/2020 upon evaluation today patient appears to actually be doing well in regard to his lower extremities there is nothing open at this point. Fortunately he has done extremely well more recently. Has been seeing lymphedema clinic as well. With that being said he has Velcro wraps for his lower legs as well as his upper legs. The only wound really is on his toe which is the right great toe and this is barely anything even there. With all that being said I think it is good to be appropriate today to go ahead and switch him over to the Velcro compression wraps. 01/26/2020 upon evaluation today patient appears to be doing worse with regard to his lower extremities after last week switch him to Velcro compression wraps. Unfortunately he lasted less than 24 hours he did not have the sock portion of his Velcro wrap on the left leg and subsequently developed a blister underneath the Velcro  portion. Obviously this is not good and not what we were  looking for at this point. He states the lymphedema clinic did tell him to wear the wrap for 23 hours and take him off for 1 I am okay with that plan but again right now we got a get things back under control again he may have some cellulitis noted as well. 02/02/2020 upon evaluation today patient unfortunately appears to have several areas of blistering on his bilateral lower extremities today mainly on the feet. His legs do seem to be doing somewhat better which is good news. Fortunately there is no evidence of active infection at this time. No fevers, chills, nausea, vomiting, or diarrhea. 02/16/2020 upon evaluation today patient appears to be doing well at this time with regard to his legs. He has a couple weeping areas on his toes but for the most part everything is doing better and does appear to be sealed up on his legs which is excellent news. We can continue with wrapping him at this point as he had every time we discontinue the wraps he just breaks out with new wounds. There is really no point in is going forward with this at this point. 03/08/2020 upon evaluation today patient actually appears to be doing quite well with regard to his lower extremity ulcers. He has just a very superficial and really almost nonexistent blister on the left lower extremity he has in general done very well with the compression wraps. With that being said I do not see any signs of infection at this time which is good news. 03/29/2020 upon evaluation today patient appears to be doing well with regard to his wounds currently except for where he had several new areas that opened up due to some of the wrap slipping and causing him trouble. He states he did not realize they had slipped. Nonetheless he has a 1 area on the right and 3 new areas on the left. Fortunately there is no signs of active infection at this time which is great news. 04/05/2020 upon evaluation  today patient actually appears to be doing quite well in general in regard to his legs currently. Fortunately there is no signs of active infection at this time. No fevers, chills, nausea, vomiting, or diarrhea. He tells me next week that he will actually be seen in the lymphedema clinic on Thursday at 10 AM I see him on Wednesday next week. 04/12/2020 upon evaluation today patient appears to be doing very well with regard to his lower extremities bilaterally. In fact he does not appear to have any open wounds at this point which is good news. Fortunately there is no signs of active infection at this time. No fevers, chills, nausea, vomiting, or diarrhea. 04/19/2020 upon evaluation today patient appears to be doing well with regard to his wounds currently on the bilateral lower extremities. There does not appear to be any signs of active infection at this time. Fortunately there is no evidence of systemic infection and overall very pleased at this point. Nonetheless after I held him out last week he literally had blisters the next morning already which swelled up with him being right back here in the clinic. Overall I think that he is just not can be able to be discharged with his legs the way they are he is much to volume overloaded as far as fluid is concerned and that was discussed with him today of also discussed this but should try the clinic nurse manager as well as Dr. Leanord Hawking. 04/26/2020 upon evaluation today patient appears to  be doing better with regard to his wounds currently. He is making some progress and overall swelling is under good control with the compression wraps. Fortunately there is no evidence of active infection at this time. 05/10/2020 on evaluation today patient appears to be doing overall well in regard to his lower extremities bilaterally. He is Tolerating the compression wraps without complication and with what we are seeing currently I feel like that he is making excellent  progress. There is no signs of active infection at this time. 05/24/2020 upon evaluation today patient appears to be doing well in regard to his legs. The swelling is actually quite a bit down compared to where it has been in the past. Fortunately there is no sign of active infection at this time which is also good news. With that being said he does have several wounds on his toes that have opened up at this point. 05/31/2020 upon evaluation today patient appears to be doing well with regard to his legs bilaterally where he really has no significant fluid buildup at this point overall he seems to be doing quite well. Very pleased in this regard. With regard to his toes these also seem to be drying up which is excellent. We have continue to wrap him as every time we tried as a transition to the juxta light wraps things just do not seem to get any better. 06/07/2020 upon evaluation today patient appears to be doing well with regard to his right leg at this point. Unfortunately left leg has a lot of blistering he tells me the wrap started to slide down on him when he tried to put his other Velcro wrap over top of it to help keep things in order but nonetheless still had some issues. 06/14/2020 on evaluation today patient appears to be doing well with regard to his lower extremity ulcers and foot ulcers at this point. I feel like everything is actually showing signs of improvement which is great news overall there is no signs of active infection at this time. No fevers, chills, nausea, vomiting, or diarrhea. 06/21/2020 on evaluation today patient actually appears to be doing okay in regard to his wounds in general. With that being said the biggest issue I see is on his right foot in particular the first and second toe seem to be doing a little worse due to the fact this is staying very wet. I think he is probably getting need to change out his dressings a couple times in between each week when we see him in  regard to his toes in order to keep this drier based on the location and how this is proceeding. 06/28/2020 on evaluation today patient appears to be doing a little bit more poorly overall in regard to the appearance of the skin I am actually somewhat concerned about the possibility of him having a little bit of an infection here. We discussed the course of potentially giving him a doxycycline prescription which he is taken previously with good result. With that being said I do believe that this is potentially mild and at this point easily fixed. I just do not want anything to get any worse. 07/12/2020 upon evaluation today patient actually appears to be making some progress with regard to his legs which is great news there does not appear to be any evidence of active infection. Overall very pleased with where things stand. 07/26/2020 upon evaluation today patient appears to be doing well with regard to his leg ulcers and  toe ulcers at this point. He has been tolerating the compression wraps without complication overall very pleased in this regard. 08/09/2020 upon evaluation today patient appears to be doing well with regard to his lower extremities bilaterally. Fortunately there is no signs of active infection overall I am pleased with where things stand. 08/23/2020 on evaluation today patient appears to be doing well with regard to his wound. He has been tolerating the dressing changes without complication. Fortunately there is no signs of active infection at this time. Overall his legs seem to be doing quite well which is great news and I am very pleased in that regard. No fevers, chills, nausea, vomiting, or diarrhea. 09/13/2020 upon evaluation today patient appears to be doing okay in regard to his lower extremities. He does have a fairly large blister on the right leg which I did remove the blister tissue from today so we can get this to dry out other than that however he seems to be doing quite  well. There is no signs of active infection at this time. 09/27/2020 upon evaluation today patient appears to actually be doing some better in regard to his right leg. Fortunately signs of active infection at this time which is great news. No fevers, chills, nausea, vomiting, or diarrhea. 10/04/2020 upon evaluation today patient actually appears to be showing signs of improvement which is great news with regard to his leg ulcers. Fortunately there is no signs of active infection which is great news he is still taking the antibiotics currently. No fevers, chills, nausea, vomiting, or diarrhea. 10/18/2020 on evaluation today patient appears to be doing well with regard to his legs currently. He has been tolerating the dressing changes including the wraps without complication. Fortunately there is no signs of active infection at this time. No fevers, chills, nausea, vomiting, or diarrhea. 10/25/2020 upon evaluation today patient appears to be doing decently well in regard to his wounds currently. He has been tolerating the dressing changes without complication. Overall I feel like he is making good progress albeit slow. Again this is something we can have to continue to wrap for some time to come most likely. 11/08/2020 upon evaluation today patient appears to be doing well with regard to his wounds currently. He has been tolerating the dressing changes without complication is not currently on any antibiotics and he does not appear to show any signs of infection. He does continue to have a lot of drainage on the right leg not too severe but nonetheless this is very scattered. On the left leg this is looking to be much improved overall. 11/15/2020 upon evaluation today patient appears to be doing better with regard to his legs bilaterally. Especially the right leg which was much more significant last week. There does not appear to be any signs of active infection which is great news. No fevers, chills,  nausea, vomiting, or diarrhea. 11/23/2019 upon evaluation today patient appears to be doing poorly still in regard to his lower extremities bilaterally. Unfortunately his right leg in particular appears to be doing much more poorly there is no signs really of infection this is not warm to touch but he does have a lot of drainage and weeping unfortunately. With that reason I do believe that we may need to initiate some treatment here to try to help calm down some of the swelling of the right leg. I think switching to a 4-layer compression wrap would be beneficial here. The patient is in agreement with giving this a  try. 11/29/2020 upon evaluation today patient appears to be doing well currently in regard to his leg ulcers. I feel like the right leg is doing better he still has a lot of drainage but we do see some improvement here. The 4-layer compression wrap I think was helpful. 12/06/2020 upon evaluation today patient appears to be doing well with regard to his legs. In fact they seem to be doing about the best I have seen up to this point. Fortunately there is no signs of active infection at this time. No fevers, chills, nausea, vomiting, or diarrhea. 12/20/2020 upon evaluation today patient appears to be doing well at this time in regard to his legs. He is not having any significant draining which is great news. Fortunately there is no signs of active infection at this time. No fevers, chills, nausea, vomiting, or diarrhea. 01/17/2021 upon evaluation today Nyko actually appears to be doing excellent in regard to his legs. He has a few areas again that come and go as far as his toes are concerned but overall this is doing quite well. 01/31/2021 upon evaluation today patient appears to be doing well with regard to his legs. Fortunately there does not appear to be any signs of active infection which is great news. Overall he is still having significant edema despite the compression wraps basically the  4-layer compression wrap to just keep things under control there is really not much room for play. 4/13: Mr. Jalomo is a longstanding patient in our clinic and benefits greatly from weekly compression wraps. Today he has no complaints. He has been tolerating the wraps well. He states he is using the lymphedema pumps at home. 5/4; patient presents for follow-up of his chronic lymphedema/venous insufficiency ulcers. He comes weekly for compression wraps. He has no complaints today. He was unable to tolerate the Coflex 2 layer Last week so we will do the four press 4-layer compression. He has been using his lymphedema pumps daily. 5/18; patient presents for 2-week follow-up. He has no complaints or issues today. He has developed a new wound to the right foot on his fourth toe. He overall feels well and denies signs of infection. 6/1; patient presents for 2-week follow-up. He has no complaints or issues today. He denies signs of infection. 04/18/2021 upon evaluation today patient appears to be doing well with regard to his legs bilaterally. Family open wound is actually on the toe of his left foot everything else is completely closed which is great news. In general I am extremely pleased with where things stand at this point. The patient is also happy that things are doing so well. 05/02/2021 upon evaluation today patient's legs actually appear to be doing quite well today. Fortunately there does not appear to be any signs of active infection which is great and overall I am extremely pleased with where he stands today. The patient does not appear to have any evidence of active infection at this time which is also great news. 05/09/2021 upon evaluation today patient appears to be doing a little bit more poorly in regard to his legs. Unfortunately he is having issues with some breakdown and a blood blister on the left leg this is due to I believe honestly to how it was wrapped last week. Fortunately there  does not appear to be any signs of infection but nonetheless this is still a concern to be honest. No fevers, chills, nausea, vomiting, or diarrhea. 05/16/2021 upon evaluation today patient appears to be doing significantly  better as compared to last week. I am very pleased with where things stand today. There does not appear to be any signs of infection which is great news and overall very pleased with where we stand. No fevers, chills, nausea, vomiting, or diarrhea. 05/30/2021 upon evaluation today patient appears to be doing well with regard to his legs. He has been tolerating the dressing changes without complication. Fortunately there does not appear to be any signs of active infection which is great news and overall I am extremely pleased with where things stand today. No fevers, chills, nausea, vomiting, or diarrhea. Objective Constitutional Well-nourished and well-hydrated in no acute distress. Vitals Time Taken: 10:38 AM, Height: 70 in, Weight: 380.2 lbs, BMI: 54.5, Temperature: 97.7 F, Pulse: 72 bpm, Respiratory Rate: 20 breaths/min, Blood Pressure: 157/72 mmHg, Capillary Blood Glucose: 176 mg/dl. Respiratory normal breathing without difficulty. Psychiatric this patient is able to make decisions and demonstrates good insight into disease process. Alert and Oriented x 3. pleasant and cooperative. General Notes: Upon inspection patient appears to again be making excellent progress in regard to his legs and again I see no signs of any infection or worsening in general I think that he is tolerating the dressing changes without complication. Overall I think that the patient is making great progress. Integumentary (Hair, Skin) Wound #193 status is Open. Original cause of wound was Gradually Appeared. The date acquired was: 01/31/2021. The wound has been in treatment 17 weeks. The wound is located on the Left T Second. The wound measures 0.8cm length x 0.9cm width x 0.1cm depth; 0.565cm^2 area  and 0.057cm^3 volume. There oe is Fat Layer (Subcutaneous Tissue) exposed. There is no tunneling or undermining noted. There is a small amount of serous drainage noted. The wound margin is distinct with the outline attached to the wound base. There is large (67-100%) pink granulation within the wound bed. There is no necrotic tissue within the wound bed. Wound #197 status is Healed - Epithelialized. Original cause of wound was Gradually Appeared. The date acquired was: 05/09/2021. The wound has been in treatment 3 weeks. The wound is located on the Left,Anterior Lower Leg. The wound measures 0cm length x 0cm width x 0cm depth; 0cm^2 area and 0cm^3 volume. There is no tunneling or undermining noted. There is a none present amount of drainage noted. There is no granulation within the wound bed. There is no necrotic tissue within the wound bed. 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 Post procedure Diagnosis Wound #: Same as Pre-Procedure There was a Four Layer Compression Therapy Procedure by Shawn Stall, RN. Post procedure Diagnosis Wound #: Same as Pre-Procedure Plan Follow-up Appointments: Return appointment in 3 weeks. - with Leonard Schwartz Nurse Visit: - 8/3 and 8/10 Bathing/ Shower/ Hygiene: May shower with protection but do not get wound dressing(s) wet. Edema Control - Lymphedema / SCD / Other: Lymphedema Pumps. Use Lymphedema  pumps on leg(s) 2-3 times a day for 45-60 minutes. If wearing any wraps or hose, do not remove them. Continue exercising as  instructed. Elevate legs to the level of the heart or above for 30 minutes daily and/or when sitting, a frequency of: - throughout the day Avoid standing for long periods of time. Exercise regularly Non Wound Condition: Other Non Wound Condition Orders/Instructions: - lotion to both legs, 4 layer compression wraps both legs, silver alginate to any weeping areas WOUND #193: - T Second Wound Laterality: Left oe Cleanser: Soap and Water 1 x Per Week/15 Days Discharge Instructions: May shower and wash wound with dial antibacterial soap and water prior to dressing change. Cleanser: Wound Cleanser 1 x Per Week/15 Days Discharge Instructions: Cleanse the wound with wound cleanser prior to applying a clean dressing using gauze sponges, not tissue or cotton balls. Prim Dressing: KerraCel Ag Gelling Fiber Dressing, 2x2 in (silver alginate) 1 x Per Week/15 Days ary Discharge Instructions: Apply silver alginate to wound bed as instructed Secondary Dressing: Woven Gauze Sponges 2x2 in 1 x Per Week/15 Days Discharge Instructions: Apply over primary dressing as directed. Secured With: Insurance underwriter, Sterile 2x75 (in/in) 1 x Per Week/15 Days Discharge Instructions: Secure with stretch gauze as directed. 1. Would recommend currently that we going to continue with the wound care measures as before. This includes the use of the silver alginate dressing to any open wound locations although there really is not much going on here. 2. I am also can recommend that we have the patient continue with the compression wraps. We are using a bilateral 4-layer compression wrap which is doing a great job as well. We will see patient back for reevaluation in 1 week here in the clinic. If anything worsens or changes patient will contact our office for additional recommendations. This  will be for nurse visit over the next 2 weeks and then in fact we will see him back in 3 weeks for provider visit. Electronic Signature(s) Signed: 05/30/2021 11:28:09 AM By: Lenda Kelp PA-C Entered By: Lenda Kelp on 05/30/2021 11:28:08 -------------------------------------------------------------------------------- SuperBill Details Patient Name: Date of Service: [name], Hikaru J. 05/30/2021 Medical Record Number: [id number] Patient Account Number: [id number] Date of Birth/Sex: Treating RN: [birth year] (70 y.o. 

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
## 2021-06-06 ENCOUNTER — Other Ambulatory Visit: Payer: Self-pay

## 2021-06-06 ENCOUNTER — Encounter (HOSPITAL_BASED_OUTPATIENT_CLINIC_OR_DEPARTMENT_OTHER): Payer: Medicare Other | Attending: Physician Assistant | Admitting: Physician Assistant

## 2021-06-06 DIAGNOSIS — L97829 Non-pressure chronic ulcer of other part of left lower leg with unspecified severity: Secondary | ICD-10-CM | POA: Diagnosis not present

## 2021-06-06 DIAGNOSIS — L97521 Non-pressure chronic ulcer of other 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 of skin: Secondary | ICD-10-CM | POA: Diagnosis not present

## 2021-06-06 DIAGNOSIS — I87333 Chronic venous hypertension (idiopathic) with ulcer and inflammation of bilateral lower extremity: Secondary | ICD-10-CM | POA: Diagnosis not present

## 2021-06-06 DIAGNOSIS — E11622 Type 2 diabetes mellitus with other skin ulcer: Secondary | ICD-10-CM | POA: Insufficient documentation

## 2021-06-06 DIAGNOSIS — I890 Lymphedema, not elsewhere classified: Secondary | ICD-10-CM | POA: Diagnosis not present

## 2021-06-06 DIAGNOSIS — E11621 Type 2 diabetes mellitus with foot ulcer: Secondary | ICD-10-CM | POA: Insufficient documentation

## 2021-06-06 DIAGNOSIS — E1140 Type 2 diabetes mellitus with diabetic neuropathy, unspecified: Secondary | ICD-10-CM | POA: Insufficient documentation

## 2021-06-06 DIAGNOSIS — L97519 Non-pressure chronic ulcer of other part of right foot with unspecified severity: Secondary | ICD-10-CM | POA: Insufficient documentation

## 2021-06-06 NOTE — Progress Notes (Signed)
[name], [name] ([id number]) Visit Report for 06/06/2021 Arrival Information Details Patient Name: Date of Service: [name], [name] 06/06/2021 10:30 A M Medical Record Number: [id number] Patient Account Number: [id number] Date of Birth/Sex: Treating RN: September 16, 1951 (70 y.o. [name], [name] Primary Care Neera Teng: Nicoletta Ba Other Clinician: Referring Marliss Buttacavoli: Treating Keimora Swartout/Extender: Adele Kevin in Treatment: 280 Visit Information History Since Last Visit Added or deleted any medications: No Patient Arrived: [name] Any new allergies or adverse reactions: No Arrival Time: 11:40 Had a fall or experienced change in No Accompanied By: self activities of daily living that may affect Transfer Assistance: None risk of falls: Patient Identification Verified: Yes Signs or symptoms of abuse/neglect since last visito No Secondary Verification Process Completed: Yes Hospitalized since last visit: No Patient Requires Transmission-Based Precautions: No Implantable device outside of the clinic excluding No Patient Has Alerts: Yes cellular tissue based products placed in the center since last visit: Has Dressing in Place as Prescribed: Yes Has Compression in Place as Prescribed: Yes Pain Present Now: No Electronic Signature(s) Signed: 06/06/2021 4:43:23 PM By: Shawn Stall Entered By: Shawn Stall on 06/06/2021 11:55:50 -------------------------------------------------------------------------------- Compression Therapy Details Patient Name: Date of Service: [name] 06/06/2021 10:30 A M Medical Record Number: [id number] Patient Account Number: [id number] Date of Birth/Sex: Treating RN: July 06, 1951 (70 y.o. [name] Primary Care Arjuna Doeden: Nicoletta Ba Other Clinician: Referring Shanera Meske: Treating Kaydee Magel/Extender: Adele Kevin in Treatment: 280 Compression Therapy Performed for Wound Assessment: NonWound Condition  Lymphedema - Right Leg Performed By: Clinician Shawn Stall, RN Compression Type: Four Layer Electronic Signature(s) Signed: 06/06/2021 4:43:23 PM By: Shawn Stall Entered By: Shawn Stall on 06/06/2021 11:58:17 -------------------------------------------------------------------------------- Compression Therapy Details Patient Name: Date of Service: [name], [name] 06/06/2021 10:30 A M Medical Record Number: [id number] Patient Account Number: [id number] Date of Birth/Sex: Treating RN: 1950-11-16 (70 y.o. [name] Primary Care Bobie Caris: Nicoletta Ba Other Clinician: Referring Earlin Sweeden: Treating Kiearra Oyervides/Extender: Adele Kevin in Treatment: 280 Compression Therapy Performed for Wound Assessment: NonWound Condition Lymphedema - Left Leg Performed By: Clinician Shawn Stall, RN Compression Type: Four Layer Electronic Signature(s) Signed: 06/06/2021 4:43:23 PM By: Shawn Stall Entered By: Shawn Stall on 06/06/2021 11:59:17 -------------------------------------------------------------------------------- Encounter Discharge Information Details Patient Name: Date of Service: [name], [name] 06/06/2021 10:30 A M Medical Record Number: [id number] Patient Account Number: [id number] Date of Birth/Sex: Treating RN: Apr 19, 1951 (70 y.o. [name] Primary Care Logen Fowle: Nicoletta Ba Other Clinician: Referring Alanny Rivers: Treating Tiah Heckel/Extender: Adele Kevin in Treatment: [phone] Encounter Discharge Information Items Discharge Condition: Stable Ambulatory Status: Walker Discharge Destination: Home Transportation: Private Auto Accompanied By: self Schedule Follow-up Appointment: Yes Clinical Summary of Care: Electronic Signature(s) Signed: 06/06/2021 4:43:23 PM By: Shawn Stall Entered By: Shawn Stall on 06/06/2021  12:01:15 -------------------------------------------------------------------------------- Patient/Caregiver Education Details Patient Name: Date of Service: [name] 8/3/2022andnbsp10:30 A M Medical Record Number: [id number] Patient Account Number: [id number] Date of Birth/Gender: Treating RN: 03-25-1951 (70 y.o. [name] Primary Care Physician: Nicoletta Ba Other Clinician: Referring Physician: Treating Physician/Extender: Adele Kevin in Treatment: [phone] Education Assessment Education Provided To: Patient Education Topics Provided Venous: Handouts: Controlling Swelling with Multilayered Compression Wraps Methods: Explain/Verbal Responses: Reinforcements needed Electronic Signature(s) Signed: 06/06/2021 4:43:23 PM By: Shawn Stall Entered By: Shawn Stall on 06/06/2021 12:01:02 -------------------------------------------------------------------------------- Wound Assessment Details Patient Name: Date of Service: [name], [name] 06/06/2021 10:30 A M Medical Record Number:  [id number] Patient Account Number: [id number] Date of Birth/Sex: Treating RN: 02/04/1951 (70 y.o. [name] Primary Care Rasheka Denard: Nicoletta Ba Other Clinician: Referring Chloie Loney: Treating Shaquoya Cosper/Extender: Adele Kevin in Treatment: 280 Wound Status Wound Number: [id number] Primary Etiology: Diabetic Wound/Ulcer of the Lower Extremity Wound Location: Left T Second oe Wound Status: Open Wounding Event: Gradually Appeared Date Acquired: 01/31/2021 Weeks Of Treatment: 18 Clustered Wound: No Wound Measurements Length: (cm) 0.8 Width: (cm) 0.9 Depth: (cm) 0.1 Area: (cm) 0.565 Volume: (cm) 0.057 % Reduction in Area: 78.7% % Reduction in Volume: 78.5% Wound Description Classification: Grade 2 Exudate Amount: Small Exudate Type: Serous Exudate Color: amber Treatment Notes Wound #193 (Toe Second) Wound Laterality:  Left Cleanser Soap and Water Discharge Instruction: May shower and wash wound with dial antibacterial soap and water prior to dressing change. Wound Cleanser Discharge Instruction: Cleanse the wound with wound cleanser prior to applying a clean dressing using gauze sponges, not tissue or cotton balls. Peri-Wound Care Topical Primary Dressing KerraCel Ag Gelling Fiber Dressing, 2x2 in (silver alginate) Discharge Instruction: Apply silver alginate to wound bed as instructed Secondary Dressing Woven Gauze Sponges 2x2 in Discharge Instruction: Apply over primary dressing as directed. Secured With Conforming Stretch Gauze Bandage, Sterile 2x75 (in/in) Discharge Instruction: Secure with stretch gauze as directed. Compression Wrap Compression Stockings Add-Ons Notes bilateral legs lotion, 4 layer compression wraps. Electronic Signature(s) Signed: 06/06/2021 4:43:23 PM By: Shawn Stall Entered By: Shawn Stall on 06/06/2021 11:57:51 -------------------------------------------------------------------------------- Vitals Details Patient Name: Date of Service: [name], [name] 06/06/2021 10:30 A M Medical Record Number: [id number] Patient Account Number: [id number] Date of Birth/Sex: Treating RN: February 26, 1951 (70 y.o. [name] Primary Care Jodeen Mclin: Nicoletta Ba Other Clinician: Referring Cliford Sequeira: Treating Azaiah Licciardi/Extender: Adele Kevin in Treatment: 280 Vital Signs Time Taken: 10:40 Temperature (F): 97.6 Height (in): 70 Pulse (bpm): 86 Weight (lbs): 380.2 Respiratory Rate (breaths/min): 20 Body Mass Index (BMI): 54.5 Blood Pressure (mmHg): 145/68 Reference Range: 80 - 120 mg / dl Electronic Signature(s) Signed: 06/06/2021 4:43:23 PM By: Shawn Stall Entered By: Shawn Stall on 06/06/2021 11:57:35

## 2021-06-06 NOTE — Progress Notes (Signed)
CORDARRELL, SANE ([id number]) Visit Report for 06/06/2021 SuperBill Details Patient Name: Date of Service: CO JOAL, EAKLE 06/06/2021 Medical Record Number: [id number] Patient Account Number: [id number] Date of Birth/Sex: Treating RN: 1951-07-25 (70 y.o. 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 Electronic Signature(s) Signed: 06/06/2021 4:43:23 PM By: Shawn Stall Signed: 06/06/2021 5:22:07 PM By: Lenda Kelp PA-C Entered By: Shawn Stall on 06/06/2021 12:01:31

## 2021-06-13 ENCOUNTER — Encounter (HOSPITAL_BASED_OUTPATIENT_CLINIC_OR_DEPARTMENT_OTHER): Payer: Medicare Other | Admitting: Physician Assistant

## 2021-06-13 ENCOUNTER — Other Ambulatory Visit: Payer: Self-pay

## 2021-06-13 ENCOUNTER — Ambulatory Visit (INDEPENDENT_AMBULATORY_CARE_PROVIDER_SITE_OTHER): Payer: Medicare Other

## 2021-06-13 DIAGNOSIS — L97521 Non-pressure chronic ulcer of other 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 of skin: Secondary | ICD-10-CM | POA: Diagnosis not present

## 2021-06-13 DIAGNOSIS — E11621 Type 2 diabetes mellitus with foot ulcer: Secondary | ICD-10-CM | POA: Diagnosis not present

## 2021-06-13 DIAGNOSIS — E1140 Type 2 diabetes mellitus with diabetic neuropathy, unspecified: Secondary | ICD-10-CM | POA: Diagnosis not present

## 2021-06-13 DIAGNOSIS — I890 Lymphedema, not elsewhere classified: Secondary | ICD-10-CM | POA: Diagnosis not present

## 2021-06-13 DIAGNOSIS — I87333 Chronic venous hypertension (idiopathic) with ulcer and inflammation of bilateral lower extremity: Secondary | ICD-10-CM | POA: Diagnosis not present

## 2021-06-13 DIAGNOSIS — L03119 Cellulitis of unspecified part of limb: Secondary | ICD-10-CM

## 2021-06-13 DIAGNOSIS — E11622 Type 2 diabetes mellitus with other skin ulcer: Secondary | ICD-10-CM | POA: Diagnosis not present

## 2021-06-13 DIAGNOSIS — L97519 Non-pressure chronic ulcer of other part of right foot with unspecified severity: Secondary | ICD-10-CM | POA: Diagnosis not present

## 2021-06-13 DIAGNOSIS — L97829 Non-pressure chronic ulcer of other part of left lower leg with unspecified severity: Secondary | ICD-10-CM | POA: Diagnosis not present

## 2021-06-13 NOTE — Progress Notes (Signed)
[name], [name] ([id number]) Visit Report for 06/13/2021 Arrival Information Details Patient Name: Date of Service: [name], [name] 06/13/2021 1:45 PM Medical Record Number: [id number] Patient Account Number: [id number] Date of Birth/Sex: Treating RN: March 02, 1951 (70 y.o. Charlean Merl, [name] Primary Care Sukhdeep Wieting: Nicoletta Ba Other Clinician: Referring Trianna Lupien: Treating Markela Wee/Extender: Adele Dan in Treatment: 281 Visit Information History Since Last Visit Added or deleted any medications: No Patient Arrived: Ambulatory Any new allergies or adverse reactions: No Arrival Time: 13:41 Had a fall or experienced change in No Accompanied By: self activities of daily living that may affect Transfer Assistance: None risk of falls: Patient Identification Verified: Yes Signs or symptoms of abuse/neglect since last visito No Secondary Verification Process Completed: Yes Hospitalized since last visit: No Patient Requires Transmission-Based Precautions: No Implantable device outside of the clinic excluding No Patient Has Alerts: Yes cellular tissue based products placed in the center since last visit: Has Dressing in Place as Prescribed: Yes Has Compression in Place as Prescribed: Yes Pain Present Now: No Electronic Signature(s) Signed: 06/13/2021 3:40:52 PM By: Fonnie Mu RN Entered By: Fonnie Mu on 06/13/2021 13:42:22 -------------------------------------------------------------------------------- Compression Therapy Details Patient Name: Date of Service: [name], [name] 06/13/2021 1:45 PM Medical Record Number: [id number] Patient Account Number: [id number] Date of Birth/Sex: Treating RN: Jan 05, 1951 (70 y.o. Lucious Groves Primary Care Kamoria Lucien: Nicoletta Ba Other Clinician: Referring Jaylun Fleener: Treating Aimee Timmons/Extender: Adele Dan in Treatment: 281 Compression Therapy Performed for Wound  Assessment: NonWound Condition Lymphedema - Left Leg Performed By: Clinician Fonnie Mu, RN Compression Type: Four Layer Electronic Signature(s) Signed: 06/13/2021 3:40:52 PM By: Fonnie Mu RN Entered By: Fonnie Mu on 06/13/2021 13:44:45 -------------------------------------------------------------------------------- Compression Therapy Details Patient Name: Date of Service: [name], [name] 06/13/2021 1:45 PM Medical Record Number: [id number] Patient Account Number: [id number] Date of Birth/Sex: Treating RN: 07/26/1951 (70 y.o. Lucious Groves Primary Care Tuff Clabo: Nicoletta Ba Other Clinician: Referring Khaliya Golinski: Treating Mclain Freer/Extender: Adele Dan in Treatment: 281 Compression Therapy Performed for Wound Assessment: NonWound Condition Lymphedema - Right Leg Performed By: Clinician Fonnie Mu, RN Compression Type: Four Layer Electronic Signature(s) Signed: 06/13/2021 3:40:52 PM By: Fonnie Mu RN Entered By: Fonnie Mu on 06/13/2021 13:45:23 -------------------------------------------------------------------------------- Encounter Discharge Information Details Patient Name: Date of Service: [name], [name] 06/13/2021 1:45 PM Medical Record Number: [id number] Patient Account Number: [id number] Date of Birth/Sex: Treating RN: Feb 21, 1951 (70 y.o. Charlean Merl, [name] Primary Care Alayshia Marini: Nicoletta Ba Other Clinician: Referring Teran Daughenbaugh: Treating Tavin Vernet/Extender: Adele Dan in Treatment: [phone] Encounter Discharge Information Items Discharge Condition: Stable Ambulatory Status: Ambulatory Discharge Destination: Home Transportation: Private Auto Accompanied By: self Schedule Follow-up Appointment: Yes Clinical Summary of Care: Patient Declined Electronic Signature(s) Signed: 06/13/2021 3:40:52 PM By: Fonnie Mu RN Entered By: Fonnie Mu on 06/13/2021  13:48:10 -------------------------------------------------------------------------------- Patient/Caregiver Education Details Patient Name: Date of Service: [name] 8/10/2022andnbsp1:45 PM Medical Record Number: [id number] Patient Account Number: [id number] Date of Birth/Gender: Treating RN: 09-12-1951 (70 y.o. Lucious Groves Primary Care Physician: Nicoletta Ba Other Clinician: Referring Physician: Treating Physician/Extender: Adele Dan in Treatment: [phone] Education Assessment Education Provided To: Patient Education Topics Provided Wound/Skin Impairment: Methods: Explain/Verbal Responses: State content correctly [employer]) Signed: 06/13/2021 3:40:52 PM By: Fonnie Mu RN Entered By: Fonnie Mu on 06/13/2021 13:47:55 -------------------------------------------------------------------------------- Wound Assessment Details Patient Name: Date of Service: [name], [name] 06/13/2021 1:45 PM Medical Record Number: [id number] Patient Account Number:  [id number] Date of Birth/Sex: Treating RN: [birth year] (70 y.o. Charlean Merl, [name] Primary Care Loriel Diehl: Nicoletta Ba Other Clinician: Referring Suesan Mohrmann: Treating Dearl Rudden/Extender: Adele Dan in Treatment: 281 Wound Status Wound Number: [id number] Primary Etiology: Diabetic Wound/Ulcer of the Lower Extremity Wound Location: Left T Second oe Wound Status: Open Wounding Event: Gradually Appeared Date Acquired: 01/31/2021 Weeks Of Treatment: 19 Clustered Wound: No Wound Measurements Length: (cm) 0.8 Width: (cm) 0.9 Depth: (cm) 0.1 Area: (cm) 0.565 Volume: (cm) 0.057 % Reduction in Area: 78.7% % Reduction in Volume: 78.5% Wound Description Classification: Grade 2 Exudate Amount: Small Exudate Type: Serous Exudate Color: amber Treatment Notes Wound #193 (Toe Second) Wound Laterality: Left Cleanser Soap and Water Discharge  Instruction: May shower and wash wound with dial antibacterial soap and water prior to dressing change. Wound Cleanser Discharge Instruction: Cleanse the wound with wound cleanser prior to applying a clean dressing using gauze sponges, not tissue or cotton balls. Peri-Wound Care Topical Primary Dressing KerraCel Ag Gelling Fiber Dressing, 2x2 in (silver alginate) Discharge Instruction: Apply silver alginate to wound bed as instructed Secondary Dressing Woven Gauze Sponges 2x2 in Discharge Instruction: Apply over primary dressing as directed. Secured With Conforming Stretch Gauze Bandage, Sterile 2x75 (in/in) Discharge Instruction: Secure with stretch gauze as directed. Compression Wrap Compression Stockings Add-Ons Notes lotion and 4 layer BLE Electronic Signature(s) Signed: 06/13/2021 3:40:52 PM By: Fonnie Mu RN Entered By: Fonnie Mu on 06/13/2021 13:42:42

## 2021-06-13 NOTE — Progress Notes (Signed)
Kevin Powellis a 70 y.o.malepresents to the office today for monthly bicillininjections, per physician's orders.  Bicillin 600,000 Unitswas administeredbilaterally in Right and Left upper outter quadtoday. Patient tolerated injection. 

## 2021-06-14 NOTE — Progress Notes (Signed)
[name], [name] ([id number]) Visit Report for 06/13/2021 SuperBill Details Patient Name: Date of Service: [name], [name] 06/13/2021 Medical Record Number: [id number] Patient Account Number: [id number] Date of Birth/Sex: Treating RN: 1951/08/17 (70 y.o. 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 Electronic Signature(s) Signed: 06/13/2021 3:40:52 PM By: Fonnie Mu RN Signed: 06/14/2021 5:47:57 PM By: Lenda Kelp PA-C Entered By: Fonnie Mu on 06/13/2021 13:48:38

## 2021-06-18 DIAGNOSIS — B351 Tinea unguium: Secondary | ICD-10-CM | POA: Diagnosis not present

## 2021-06-18 DIAGNOSIS — E1142 Type 2 diabetes mellitus with diabetic polyneuropathy: Secondary | ICD-10-CM | POA: Diagnosis not present

## 2021-06-18 DIAGNOSIS — L03031 Cellulitis of right toe: Secondary | ICD-10-CM | POA: Diagnosis not present

## 2021-06-18 DIAGNOSIS — Z794 Long term (current) use of insulin: Secondary | ICD-10-CM | POA: Diagnosis not present

## 2021-06-20 ENCOUNTER — Encounter (HOSPITAL_BASED_OUTPATIENT_CLINIC_OR_DEPARTMENT_OTHER): Payer: Medicare Other | Admitting: Physician Assistant

## 2021-06-20 ENCOUNTER — Other Ambulatory Visit: Payer: Self-pay

## 2021-06-20 DIAGNOSIS — E119 Type 2 diabetes mellitus without complications: Secondary | ICD-10-CM | POA: Diagnosis not present

## 2021-06-20 DIAGNOSIS — E11622 Type 2 diabetes mellitus with other skin ulcer: Secondary | ICD-10-CM | POA: Diagnosis not present

## 2021-06-20 DIAGNOSIS — L97522 Non-pressure chronic ulcer of other part of left foot with fat layer exposed: Secondary | ICD-10-CM | POA: Diagnosis not present

## 2021-06-20 DIAGNOSIS — L97829 Non-pressure chronic ulcer of other part of left lower leg with unspecified severity: Secondary | ICD-10-CM | POA: Diagnosis not present

## 2021-06-20 DIAGNOSIS — E1140 Type 2 diabetes mellitus with diabetic neuropathy, unspecified: Secondary | ICD-10-CM | POA: Diagnosis not present

## 2021-06-20 DIAGNOSIS — I87333 Chronic venous hypertension (idiopathic) with ulcer and inflammation of bilateral lower extremity: Secondary | ICD-10-CM | POA: Diagnosis not present

## 2021-06-20 DIAGNOSIS — L97519 Non-pressure chronic ulcer of other part of right foot with unspecified severity: Secondary | ICD-10-CM | POA: Diagnosis not present

## 2021-06-20 DIAGNOSIS — E11621 Type 2 diabetes mellitus with foot ulcer: Secondary | ICD-10-CM | POA: Diagnosis not present

## 2021-06-20 DIAGNOSIS — L97521 Non-pressure chronic ulcer of other 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 of skin: Secondary | ICD-10-CM | POA: Diagnosis not present

## 2021-06-20 DIAGNOSIS — I890 Lymphedema, not elsewhere classified: Secondary | ICD-10-CM | POA: Diagnosis not present

## 2021-06-20 LAB — HM DIABETES EYE EXAM

## 2021-06-20 NOTE — Progress Notes (Addendum)
HALEN, ANTENUCCI ([id number]) Visit Report for 06/20/2021 Chief Complaint Document Details Patient Name: Date of Service: CO ONIEL, MELESKI 06/20/2021 10:30 A M Medical Record Number: [id number] Patient Account Number: [id number] Date of Birth/Sex: Treating RN: 1951-10-17 (70 y.o. M) Primary Care Provider: Nicoletta Ba Other Clinician: Referring Provider: Treating Provider/Extender: Adele Dan in Treatment: 282 Information Obtained from: Patient Chief Complaint patient is here for evaluation of venous/lymphedema ulcers Electronic Signature(s) Signed: 06/20/2021 10:54:55 AM By: Lenda Kelp PA-C Entered By: Lenda Kelp on 06/20/2021 10:54:54 -------------------------------------------------------------------------------- HPI Details Patient Name: Date of Service: [name], Jozeph J. 06/20/2021 10:30 A M Medical Record Number: [id number] Patient Account Number: [id number] Date of Birth/Sex: Treating RN: 08-Jun-1951 (70 y.o. M) Primary Care Provider: Nicoletta Ba Other Clinician: Referring Provider: Treating Provider/Extender: Adele Dan in Treatment: 282 History of Present Illness HPI Description: Referred by PCP for consultation. Patient has long standing history of BLE venous stasis, no prior ulcerations. At beginning of month, developed cellulitis and weeping. Received IM Rocephin followed by Keflex and resolved. Wears compression stocking, appr 6 months old. Not sure strength. No present drainage. 01/22/16 this is a patient who is a type II diabetic on insulin. 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 He tells me he religiously wears pressure stockings of uncertain strength. He was here with weeping edema about 8 months ago but did not have an open wound. Roughly a month ago he had a reopening on his bilateral legs. He is been using bandages and Neosporin. He does not complain of pain. 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 Last BUN/creatinine I have is from 11/20/15 at 13 and 1.0 respectively 01/29/16; patient arrives today having tolerated the Profore wrap. He brought in his stockings and these are 18 mmHg stockings he bought from [place]. The compression here is likely inadequate. He does not complain of pain or excessive drainage she has no systemic symptoms. The wound on the right looks improved as does the one on the left although one on the left is more substantial with still tissue at risk below the actual wound area on the bilateral posterior calf 02/05/16; patient arrives with poor edema control. He states that we did put a 4 layer compression on it last week. No weight appear 5 this. 02/12/16; the area on the posterior right Has healed. The left Has a substantial wound that has necrotic surface eschar that requires a debridement with a curette. 02/16/16;the patient called or a Nurse visit secondary to increased swelling. He had been in earlier in the week with his right leg healed. He was transitioned to is on pressure stocking on the right leg with the only open wound on the left, a substantial area on the left posterior calf. Note he has a history of severe lower extremity edema, he has a history of chronic atrial fibrillation but not heart failure per my notes but I'll need to research this. He is not complaining of chest pain shortness of breath or orthopnea. The intake nurse noted blisters on the previously closed right leg 02/19/16; this is the patient's regular visit day. I see him on Friday with escalating edema new wounds on the right leg and clear signs of at least right ventricular heart failure. I increased his Lasix to 40 twice a day. He is returning  currently in follow-up. States he is noticed a decrease in that the edema 02/26/16 patient's legs have much less  edema. There is nothing really open on the right leg. The left leg has improved condition of the large superficial wound on the posterior left leg 03/04/16; edema control is very much better. The patient's right leg wounds have healed. On the left leg he continues to have severe venous inflammation on the posterior aspect of the left leg. There is no tenderness and I don't think any of this is cellulitis. 03/11/16; patient's right leg is married healed and he is in his own stocking. The patient's left leg has deteriorated somewhat. There is a lot of erythema around the wound on the posterior left leg. There is also a significant rim of erythema posteriorly just above where the wrap would've ended there is a new wound in this location and a lot of tenderness. Can't rule out cellulitis in this area. 03/15/16; patient's right leg remains healed and he is in his own stocking. The patient's left leg is much better than last review. His major wound on the posterior aspect of his left Is almost fully epithelialized. He has 3 small injuries from the wraps. Really. Erythema seems a lot better on antibiotics 03/18/16; right leg remains healed and he is in his own stocking. The patient's left leg is much better. The area on the posterior aspect of the left calf is fully epithelialized. His 3 small injuries which were wrap injuries on the left are improved only one seems still open his erythema has resolved 03/25/16; patient's right leg remains healed and he is in his own stocking. There is no open area today on the left leg posterior leg is completely closed up. His wrap injuries at the superior aspect of his leg are also resolved. He looks as though he has some irritation on the dorsal ankle but this is fully epithelialized without evidence of infection. 03/28/16; we discharged this patient on Monday. Transitioned him into his own stocking. There were problems almost immediately with uncontrolled swelling weeping edema  multiple some of which have opened. He does not feel systemically unwell in particular no chest pain no shortness of breath and he does not feel 04/08/16; the edema is under better control with the Profore light wrap but he still has pitting edema. There is one large wound anteriorly 2 on the medial aspect of his left leg and 3 small areas on the superior posterior calf. Drainage is not excessive he is tolerating a Profore light well 04/15/16; put a Profore wrap on him last week. This is controlled is edema however he had a lot of pain on his left anterior foot most of his wounds are healed 04/22/16 once again the patient has denuded areas on the left anterior foot which he states are because his wrap slips up word. He saw his primary physician today is on Lasix 40 twice a day and states that he his weight is down 20 pounds over the last 3 months. 04/29/16: Much improved. left anterior foot much improved. He is now on Lasix 80 mg per day. Much improved edema control 05/06/16; I was hoping to be able to discharge him today however once again he has blisters at a low level of where the compression was placed last week mostly on his left lateral but also his left medial leg and a small area on the anterior part of the left foot. 05/09/16; apparently the patient went home after his  appointment on 7/4 later in the evening developing pain in his upper medial thigh together with subjective fever and chills although his temperature was not taken. The pain was so intense he felt he would probably have to call 911. However he then remembered that he had leftover doxycycline from a previous round of antibiotics and took these. By the next morning he felt a lot better. He called and spoke to one of our nurses and I approved doxycycline over the phone thinking that this was in relation to the wounds we had previously seen although they were definitely were not. The patient feels a lot better old fever no chills he is still  working. Blood sugars are reasonably controlled 05/13/16; patient is back in for review of his cellulitis on his anterior medial upper thigh. He is taking doxycycline this is a lot better. Culture I did of the nodular area on the dorsal aspect of his foot grew MRSA this also looks a lot better. 05/20/16; the patient is cellulitis on the medial upper thigh has resolved. All of his wound areas including the left anterior foot, areas on the medial aspect of the left calf and the lateral aspect of the calf at all resolved. He has a new blister on the left dorsal foot at the level of the fourth toe this was excised. No evidence of infection 05/27/16; patient continues to complain weeping edema. He has new blisterlike wounds on the left anterior lateral and posterior lateral calf at the top of his wrap levels. The area on his left anterior foot appears better. He is not complaining of fever, pain or pruritus in his feet. 05/30/16; the patient's blisters on his left anterior leg posterior calf all look improved. He did not increase the Lasix 100 mg as I suggested because he was going to run out of his 40 mg tablets. He is still having weeping edema of his toes 06/03/16; I renewed his Lasix at 80 mg once a day as he was about to run out when I last saw him. He is on 80 mg of Lasix now. I have asked him to cut down on the excessive amount of water he was drinking and asked him to drink according to his thirst mechanisms 06/12/2016 -- was seen 2 days ago and was supposed to wear his compression stockings at home but he is developed lymphedema and superficial blisters on the left lower extremity and hence came in for a review 06/24/16; the remaining wound is on his left anterior leg. He still has edema coming from between his toes. There is lymphedema here however his edema is generally better than when I last saw this. He has a history of atrial fibrillation but does not have a known history of congestive heart  failure nevertheless I think he probably has this at least on a diastolic basis. 07/01/16 I reviewed his echocardiogram from January 2017. This was essentially normal. He did not have LVH, EF of 55-60%. His right ventricular function was normal although he did have trivial tricuspid and pulmonic regurgitation. This is not audible on exam however. I increased his Lasix to do massive edema in his legs well above his knees I think in early July. He was also drinking an excessive amount of water at the time. 07/15/16; missed his appointment last week because of the Labor Day holiday on Monday. He could not get another appointment later in the week. Started to feel the wrap digging in superiorly so we remove the  top half and the bottom half of his wrap. He has extensive erythema and blistering superiorly in the left leg. Very tender. Very swollen. Edema in his foot with leaking edema fluid. He has not been systemically unwell 07/22/16; the area on the left leg laterally required some debridement. The medial wounds look more stable. His wrap injury wounds appear to have healed. Edema and his foot is better, weeping edema is also better. He tells me he is meeting with the supplier of the external compression pumps at work 08/05/16; the patient was on vacation last week in [facility]. His wrap is been on for an extended period of time. Also over the weekend he developed an extensive area of tender erythema across his anterior medial thigh. He took to doxycycline yesterday that he had leftover from a previous prescription. The patient complains of weeping edema coming out of his toes 08/08/16; I saw this patient on 10/2. He was tender across his anterior thigh. I put him on doxycycline. He returns today in follow-up. He does not have any open wounds on his lower leg, he still has edema weeping into his toes. 08/12/16; patient was seen back urgently today to follow-up for his extensive left thigh  cellulitis/erysipelas. He comes back with a lot less swelling and erythema pain is much better. I believe I gave him Augmentin and Cipro. His wrap was cut down as he stated a roll down his legs. He developed blistering above the level of the wrap that remained. He has 2 open blisters and 1 intact. 08/19/16; patient is been doing his primary doctor who is increased his Lasix from 40-80 once a day or 80 already has less edema. Cellulitis has remained improved in the left thigh. 2 open areas on the posterior left calf 08/26/16; he returns today having new open blisters on the anterior part of his left leg. He has his compression pumps but is not yet been shown how to use some vital representative from the supplier. 09/02/16 patient returns today with no open wounds on the left leg. Some maceration in his plantar toes 09/10/2016 -- Dr. Leanord Hawking had recently discharged him on 09/02/2016 and he has come right back with redness swelling and some open ulcers on his left lower extremity. He says this was caused by trying to apply his compression stockings and he's been unable to use this and has not been able to use his lymphedema pumps. He had some doxycycline leftover and he has started on this a few days ago. 09/16/16; there are no open wounds on his leg on the left and no evidence of cellulitis. 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 He does not complain of symptoms here. I am not clear use using his external compression pumps. 09/23/16; I have not seen this patient in 2 weeks. He canceled his appointment 10 days ago as he was going on vacation. He tells me that on Monday he noticed a large area on his posterior left leg which is been draining copiously and is reopened into a large wound. He is been using ABDs and the external part of his juxtalite, according to our nurse this was not on properly. 10/07/16; Still a substantial area on the posterior  left leg. Using silver alginate 10/14/16; in general better although there is still open area which looks healthy. Still using silver alginate. He reminds me that this happen before he left for [facility]. T oday while  he was showering in the morning. He had been using his juxtalite's 10/21/16; the area on his posterior left leg is fully epithelialized. However he arrives today with a large area of tender erythema in his medial and posterior left thigh just above the knee. I have marked the area. Once again he is reluctant to consider hospitalization. I treated him with oral antibiotics in the past for a similar situation with resolution I think with doxycycline however this area it seems more extensive to me. He is not complaining of fever but does have chills and says states he is thirsty. His blood sugar today was in the 140s at home 10/25/16 the area on his posterior left leg is fully epithelialized although there is still some weeping edema. The large area of tenderness and erythema in his medial and posterior left thigh is a lot less tender although there is still a lot of swelling in this thigh. He states he feels a lot better. He is on doxycycline and Augmentin that I started last week. This will continued until Tuesday, December 26. I have ordered a duplex ultrasound of the left thigh rule out DVT whether there is an abscess something that would need to be drained I would also like to know. 11/01/16; he still has weeping edema from a not fully epithelialized area on his left posterior calf. Most of the rest of this looks a lot better. He has completed his antibiotics. His thigh is a lot better. Duplex ultrasound did not show a DVT in the thigh 11/08/16; he comes in today with more Denuded surface epithelium from the posterior aspect of his calf. There is no real evidence of cellulitis. The superior aspect of his wrap appears to have put quite an indentation in his leg just below the knee and  this may have contributed. He does not complain of pain or fever. We have been using silver alginate as the primary dressing. The area of cellulitis in the right thigh has totally resolved. He has been using his compression stockings once a week 11/15/16; the patient arrives today with more loss of epithelium from the posterior aspect of his left calf. He now has a fairly substantial wound in this area. The reason behind this deterioration isn't exactly clear although his edema is not well controlled. He states he feels he is generally more swollen systemically. He is not complaining of chest pain shortness of breath fever. T me he has an appointment with his primary physician in early February. He is on 80 mg of oral ells Lasix a day. He claims compliance with the external compression pumps. He is not having any pain in his legs similar to what he has with his recurrent cellulitis 11/22/16; the patient arrives a follow-up of his large area on his left lateral calf. This looks somewhat better today. He came in earlier in the week for a dressing change since I saw him a week ago. He is not complaining of any pain no shortness of breath no chest pain 11/28/16; the patient arrives for follow-up of his large area on the left lateral calf this does not look better. In fact it is larger weeping edema. The surface of the wound does not look too bad. We have been using silver alginate although I'm not certain that this is a dressing issue. 12/05/16; again the patient follows up for a large wound on the left lateral and left posterior calf this does not look better. There continues to be weeping  edema necrotic surface tissue. More worrisome than this once again there is erythema below the wound involving the distal Achilles and heel suggestive of cellulitis. He is on his feet working most of the day of this is not going well. We are changing his dressing twice a week to facilitate the drainage. 12/12/16; not much  change in the overall dimensions of the large area on the left posterior calf. This is very inflamed looking. I gave him an. Doxycycline last week does not really seem to have helped. He found the wrap very painful indeed it seems to of dog into his legs superiorly and perhaps around the heel. He came in early today because the drainage had soaked through his dressings. 12/19/16- patient arrives for follow-up evaluation of his left lower extremity ulcers. He states that he is using his lymphedema pumps once daily when there is "no drainage". He admits to not using his lipedema pumps while under current treatment. His blood sugars have been consistently between 150-200. 12/26/16; the patient is not using his compression pumps at home because of the wetness on his feet. I've advised him that I think it's important for him to use this daily. He finds his feet too wet, he can put a plastic bag over his legs while he is in the pumps. Otherwise I think will be in a vicious circle. We are using silver alginate to the major area on his left posterior calf 01/02/17; the patient's posterior left leg has further of all into 3 open wounds. All of them covered with a necrotic surface. He claims to be using his compression pumps once a day. His edema control is marginal. Continue with silver alginate 01/10/17; the patient's left posterior leg actually looks somewhat better. There is less edema, less erythema. Still has 3 open areas covered with a necrotic surface requiring debridement. He claims to be using his compression pumps once a day his edema control is better 01/17/17; the patient's left posterior calf look better last week when I saw him and his wrap was changed 2 days ago. He has noted increasing pain in the left heel and arrives today with much larger wounds extensive erythema extending down into the entire heel area especially tender medially. He is not systemically unwell CBGs have been controlled no fever.  Our intake nurse showed me limegreen drainage on his AVD pads. 01/24/17; his usual this patient responds nicely to antibiotics last week giving him Levaquin for presumed Pseudomonas. The whole entire posterior part of his leg is much better much less inflamed and in the case of his Achilles heel area much less tender. He has also had some epithelialization posteriorly there are still open areas here and still draining but overall considerably better 01/31/17- He has continue to tolerate the compression wraps. he states that he continues to use the lymphedema pumps daily, and can increase to twice daily on the weekends. He is voicing no complaints or concerns regarding his LLE ulcers 02/07/17-he is here for follow-up evaluation. He states that he noted some erythema to the left medial and anterior thigh, which he states is new as of yesterday. He is concerned about recurrent cellulitis. He states his blood sugars have been slightly elevated, this morning in the 180s 02/14/17; he is here for follow-up evaluation. When he was last here there was erythema superiorly from his posterior wound in his anterior thigh. He was prescribed Levaquin however a culture of the wound surface grew MRSA over the phone I changed  him to doxycycline on Monday and things seem to be a lot better. 02/24/17; patient missed his appointment on Friday therefore we changed his nurse visit into a physician visit today. Still using silver alginate on the large area of the posterior left thigh. He isn't new area on the dorsal left second toe 03/03/17; actually better today although he admits he has not used his external compression pumps in the last 2 days or so because of work responsibilities over the weekend. 03/10/17; continued improvement. External compression pumps once a day almost all of his wounds have closed on the posterior left calf. Better edema control 03/17/17; in general improved. He still has 3 small open areas on the lateral  aspect of his left leg however most of the area on the posterior part of his leg is epithelialized. He has better edema control. He has an ABD pad under his stocking on the right anterior lower leg although he did not let us look at that today. 03/24/17; patient arrives back in clinic today with no open areas however there are areas on the posterior left calf and anterior left calf that are less than 100% epithelialized. His edema is well controlled in the left lower leg. There is some pitting edema probably lymphedema in the left upper thigh. He uses compression pumps at home once per day. I tried to get him to do this twice a day although he is very reticent. 04/01/2017 -- for the last 2 days he's had significant redness, tenderness and weeping and came in for an urgent visit today. 04/07/17; patient still has 6 more days of doxycycline. He was seen by Dr. Meyer Russel last Wednesday for cellulitis involving the posterior aspect, lateral aspect of his Involving his heel. For the most part he is better there is less erythema and less weeping. He has been on his feet for 12 hours 2 over the weekend. Using his compression pumps once a day 04/14/17 arrives today with continued improvement. Only one area on the posterior left calf that is not fully epithelialized. He has intense bilateral venous inflammation associated with his chronic venous insufficiency disease and secondary lymphedema. We have been using silver alginate to the left posterior calf wound In passing he tells [place] today that the right leg but we have not seen in quite some time has an open area on it but he doesn't want [place] to look at this today states he will show this to [place] next week. 04/21/17; there is no open area on his left leg although he still reports some weeping edema. He showed [place] his right leg today which is the first time we've seen this leg in a long time. He has a large area of open wound on the right leg anteriorly healthy granulation.  Quite a bit of swelling in the right leg and some degree of venous inflammation. He told us about the right leg in passing last week but states that deterioration in the right leg really only happened over the weekend 04/28/17; there is no open area on the left leg although there is an irritated part on the posterior which is like a wrap injury. The wound on the right leg which was new from last week at least to [place] is a lot better. 05/05/17; still no open area on the left leg. Patient is using his new compression stocking which seems to be doing a good job of controlling the edema. He states he is using his compression pumps once per  day. The right leg still has an open wound although it is better in terms of surface area. Required debridement. A lot of pain in the posterior right Achilles marked tenderness. Usually this type of presentation this patient gives concern for an active cellulitis 05/12/17; patient arrives today with his major wound from last week on the right lateral leg somewhat better. Still requiring debridement. He was using his compression stocking on the left leg however that is reopened with superficial wounds anteriorly he did not have an open wound on this leg previously. He is still using his juxta light's once daily at night. He cannot find the time to do this in the morning as he has to be at work by 7 AM 05/19/17; right lateral leg wound looks improved. No debridement required. The concerning area is on the left posterior leg which appears to almost have a subcutaneous hemorrhagic component to it. We've been using silver alginate to all the wounds 05/26/17; the right lateral leg wound continues to look improved. However the area on the left posterior calf is a tightly adherent surface. [place] using silver alginate. Because of the weeping edema in his legs there is very little good alternatives. 06/02/17; the patient left here last week looking quite good. Major wound on the left  posterior calf and a small one on the right lateral calf. Both of these look satisfactory. He tells me that by Wednesday he had noted increased pain in the left leg and drainage. He called on Thursday and Friday to get an appointment here but we were blocked. He did not go to urgent care or his primary physician. He thinks he had a fever on Thursday but did not actually take his temperature. He has not been using his compression pumps on the left leg because of pain. I advised him to go to the emergency room today for IV antibiotics for stents of left leg cellulitis but he has refused I have asked him to take 2 days off work to keep his leg elevated and he has refused this as well. In view of this I'm going to call him and Augmentin and doxycycline. 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 However after a detailed discussion regarding the risk benefits and alternatives he refuses to get admitted to the hospital. With no other recourse I will continue him on oral antibiotics as before and hopefully he'll have his infectious disease consultation this week. 06/16/2017 -- the patient was seen today by the nurse practitioner at infectious disease Ms. Dixon. 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 He has also been advise Lotrimin cream locally. she also had wise IV antibiotics for his condition if it flares up 06/23/17; patient arrives today with drainage bilaterally although the remaining wound on the left posterior calf after cleaning up today "highlighter yellow drainage" did not  look too bad. Unfortunately he has had breakdown on the right anterior leg [previously this leg had not been open and he is using a black stocking] he went to see infectious disease and is been  put on clindamycin 150 mg daily, I did not verify the dose although I'm not familiar with using clindamycin in this dosing range, perhaps for prophylaxisoo 06/27/17; I brought this patient back today to follow-up on the wound deterioration on the right lower leg together with surrounding cellulitis. I started him on doxycycline 4 days ago. This area looks better however he comes in today with intense cellulitis on the medial part of his left thigh. This is not have a wound in this area. Extremely tender. We've been using silver alginate to the wounds on the right lower leg left lower leg with bilateral 4 layer compression he is using his external compression pumps once a day 07/04/17; patient's left medial thigh cellulitis looks better. He has not been using his compression pumps as his insert said it was contraindicated with cellulitis. His right leg continues to make improvements all the wounds are still open. We only have one remaining wound on the left posterior calf. Using silver alginate to all open areas. He is on doxycycline which I started a week ago and should be finishing I gave him Augmentin after Thursday's visit for the severe cellulitis on the left medial thigh which fortunately looks better 07/14/17; the patient's left medial thigh cellulitis has resolved. The cellulitis in his right lower calf on the right also looks better. All of his wounds are stable to improved we've been using silver alginate he has completed the antibiotics I have given him. He has clindamycin 150 mg once a day prescribed by infectious disease for prophylaxis, I've advised him to start this now. We have been using bilateral Unna boots over silver alginate to the wound areas 07/21/17; the patient is been to see infectious  disease who noted his recurrent problems with cellulitis. He was not able to tolerate prophylactic clindamycin therefore he is on amoxicillin 500 twice a day. He also had a second daily dose of Lasix added By Dr. Oneta Rack but he is not taking this. Nor is he being completely compliant with his compression pumps a especially not this week. He has 2 remaining wounds one on the right posterior lateral lower leg and one on the left posterior medial lower leg. 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 The patient has Unna boots bilaterally. 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 He has bilateral Unna boots for compression and silver alginate to his wounds. Arrives today with his legs looking as good as I have seen him in quite some time. Not surprisingly his wounds look better as well with improvement on the right lateral leg venous insufficiency wound and also the left medial leg. He is still using the compression pumps once a day 08/11/17; both legs appear to be doing better wounds on the right lateral and left medial legs look better. Skin on the right leg quite good. He is been using silver alginate as the primary dressing. I'm going to use Anasept gel calcium alginate and maintain all the secondary dressings 08/18/17; the patient continues to actually do quite well. The area on his right lateral leg is just about closed the left medial also looks better although it is still moist in this area. His edema is well controlled we have been using Anasept gel with calcium alginate and the usual secondary  dressings, 4 layer compression and once daily use of his compression pumps "always been able to manage 09/01/17; the patient continues to do reasonably well in spite of his trip [place]. The area on the right lateral leg is epithelialized. Left is much better but still open. He has more edema and more chronic erythema on the left leg [venous inflammation] 09/08/17; he arrives today with no open wound on the right lateral leg and decently controlled edema. Unfortunately his left leg is not nearly as in his good situation as last week.he apparently had increasing edema starting on Saturday. He edema soaked through into his foot so used a plastic bag to walk around his home. The area on the medial right leg which was his open area is about the same however he has lost surface epithelium on the left lateral which is new and he has significant pain in the Achilles area of the left foot. He is already on amoxicillin chronically for prophylaxis of cellulitis in the left leg 09/15/17; he is completed a week of doxycycline and the cellulitis in the left posterior leg and Achilles area is as usual improved. He still has a lot of edema and fluid soaking through his dressings. There is no open wound on the right leg. He saw infectious disease NP today 09/22/17;As usual 1 we transition him from our compression wraps to his stockings things did not go well. He has several small open areas on the right leg. He states this was caused by the compression wrap on his skin although he did not wear this with the stockings over them. He has several superficial areas on the left leg medially laterally posteriorly. He does not have any evidence of active cellulitis especially involving the left Achilles The patient is traveling from [facility] Saturday going to [facility]. He states he isn't attempting to get an appointment with a heel objects wound center there to change his dressings. I am not completely certain whether this will work 10/06/17; the patient came in on Friday for a nurse visit and the nurse reported that his legs actually look quite good. He arrives in clinic today for his  regular follow-up visit. He has a new wound on his left third toe over the PIP probably caused by friction with his footwear. He has small areas on the left leg and a very superficial but epithelialized area on the right anterior lateral lower leg. Other than that his legs look as good as I've seen him in quite some time. We have been using silver alginate Review of systems; no chest pain no shortness of breath other than this a 10 point review of systems negative 10/20/17; seen by Dr. Meyer Russel last week. He had taken some antibiotics [doxycycline] that he had left over. Dr. Meyer Russel thought he had candida infection and declined to give him further antibiotics. He has a small wound remaining on the right lateral leg several areas on the left leg including a larger area on the left posterior several left medial and anterior and a small wound on the left lateral. The area on the left dorsal third toe looks a lot better. ROS; Gen.; no fever, respiratory no cough no sputum Cardiac no chest pain other than this 10 point review of system is negative 10/30/17; patient arrives today having fallen in the bathtub 3 days ago. It took him a while to get up. He has pain and maceration in the wounds on his left leg which have  deteriorated. He has not been using his pumps he also has some maceration on the right lateral leg. 11/03/17; patient continues to have weeping edema especially in the left leg. This saturates his dressings which were just put on on 12/27. As usual the doxycycline seems to take care of the cellulitis on his lower leg. He is not complaining of fever, chills, or other systemic symptoms. He states his leg feels a lot better on the doxycycline I gave him empirically. He also apparently gets injections at his primary doctor's officeo Rocephin for cellulitis prophylaxis. I didn't ask him about his compression pump compliance today I think that's probably marginal. Arrives in the clinic with all of his  dressings primary and secondary macerated full of fluid and he has bilateral edema 11/10/17; the patient's right leg looks some better although there is still a cluster of wounds on the right lateral. The left leg is inflamed with almost circumferential skin loss medially to laterally although we are still maintaining anteriorly. He does not have overt cellulitis there is a lot of drainage. He is not using compression pumps. We have been using silver alginate to the wound areas, there are not a lot of options here 11/17/17; the patient's right leg continues to be stable although there is still open wounds, better than last week. The inflammation in the left leg is better. Still loss of surface layer epithelium especially posteriorly. There is no overt cellulitis in the amount of edema and his left leg is really quite good, tells me he is using his compression pumps once a day. 11/24/17; patient's right leg has a small superficial wound laterally this continues to improve. The inflammation in the left leg is still improving however we have continuous surface layer epithelial loss posteriorly. There is no overt cellulitis in the amount of edema in both legs is really quite good. He states he is using his compression pumps on the left leg once a day for 5 out of 7 days 12/01/17; very small superficial areas on the right lateral leg continue to improve. Edema control in both legs is better today. He has continued loss of surface epithelialization and left posterior calf although I think this is better. We have been using silver alginate with large number of absorptive secondary dressings 4 layer on the left Unna boot on the right at his request. He tells me he is using his compression pumps once a day 12/08/17; he has no open area on the right leg is edema control is good here. On the left leg however he has marked erythema and tenderness breakdown of skin. He has what appears to be a wrap injury just distal to  the popliteal fossa. 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 There was swelling erythema and breakdown in the right leg. He has much worse skin breakdown in the left leg as well multiple open areas medially and posteriorly as well as laterally. He tells me he has been using his compression pumps but tells me he feels that the drainage out of his leg is worse when he uses a compression pumps. T be fair to him he is been saying this o for a while however I don't know that I  have really been listening to this. I wonder if the compression pumps are working properly 12/22/17;. Once again he arrives with severe erythema, weeping edema from the left greater than right leg. Noncompliance with compression pumps. New this visit he is complaining of pain on the lateral aspect of the right leg and the medial aspect of his right thigh. He apparently saw his cardiologist Dr. [name] who was ordered an echocardiogram area and I think this is a step in the right direction 12/25/17; started his doxycycline Monday night. There is still intense erythema of the right leg especially in the anterior thigh although there is less tenderness. The erythema around the wound on the right lateral calf also is less tender. He still complaining of pain in the left heel. His wounds are about the same right lateral left medial left lateral. Superficial but certainly not close to closure. He denies being systemically unwell no fever chills no abdominal pain no diarrhea 12/29/17; back in follow-up of his extensive right calf and right thigh cellulitis. I added amoxicillin to cover possible doxycycline resistant strep. This seems to of done the trick he is in much  less pain there is much less erythema and swelling. He has his echocardiogram at 11:00 this morning. X-ray of the left heel was also negative. 01/05/18; the patient arrived with his edema under much better control. Now that he is retired he is able to use his compression pumps daily and sometimes twice a day per the patient. He has a wound on the right leg the lateral wound looks better. Area on the left leg also looks a lot better. He has no evidence of cellulitis in his bilateral thighs I had a quick peak at his echocardiogram. He is in normal ejection fraction and normal left ventricular function. He has moderate pulmonary hypertension moderately reduced right ventricular function. One would have to wonder about chronic sleep apnea although he says he doesn't snore. He'll review the echocardiogram with his cardiologist. 01/12/18; the patient arrives with the edema in both legs under exemplary control. He is using his compression pumps daily and sometimes twice daily. His wound on the right lateral leg is just about closed. He still has some weeping areas on the posterior left calf and lateral left calf although everything is just about closed here as well. I have spoken with Aldean Baker who is the patient's nurse practitioner and infectious disease. She was concerned that the patient had not understood that the parenteral penicillin injections he was receiving for cellulitis prophylaxis was actually benefiting him. I don't think the patient actually saw that I would tend to agree we were certainly dealing with less infections although he had a serious one last month. 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 He is healed. He'll be placed in juxtalite compression wraps and increase his lymphedema pumps to twice daily. We will follow up again next week to ensure there are no issues with the new regiment. 01/20/18-he is here for evaluation of bilateral lower  extremity weeping edema. Yesterday he was placed in compression wrap to the right lower extremity and compression stocking to left lower shrubbery. He states he uses lymphedema pumps last night and again this morning and noted a blister to the left lower extremity. On exam he was noted to have drainage to the right lower extremity. He will be placed in Unna boots bilaterally and follow-up next week 01/26/18; patient was actually discharged a week ago to his  own juxta light stockings only to return the next day with bilateral lower extremity weeping edema.he was placed in bilateral Unna boots. He arrives today with pain in the back of his left leg. There is no open area on the right leg however there is a linear/wrap injury on the left leg and weeping edema on the left leg posteriorly. I spoke with infectious disease about 10 days ago. They were disappointed that the patient elected to discontinue prophylactic intramuscular penicillin shots as they felt it was particularly beneficial in reducing the frequency of his cellulitis. I discussed this with the patient today. He does not share this view. He'll definitely need antibiotics today. Finally he is traveling to [place] and trauma leaving this Saturday and returning a week later and he does not travel with his pumps. He is going by car 01/30/18; patient was seen 4 days ago and brought back in today for review of cellulitis in the left leg posteriorly. I put him on amoxicillin this really hasn't helped as much as I might like. He is also worried because he is traveling to [facility] trauma by car. Finally we will be rewrapping him. There is no open area on the right leg over his left leg has multiple weeping areas as usual 02/09/18; The same wrap on for 10 days. He did not pick up the last doxycycline I prescribed for him. He apparently took 4 days worth he already had. There is nothing open on his right leg and the edema control is really quite good. He's  had damage in the left leg medially and laterally especially probably related to the prolonged use of Unna boots 02/12/18; the patient arrived in clinic today for a nurse visit/wrap change. He complained of a lot of pain in the left posterior calf. He is taking doxycycline that I previously prescribed for him. Unfortunately even though he used his stockings and apparently used to compression pumps twice a day he has weeping edema coming out of the lateral part of his right leg. This is coming from the lower anterior lateral skin area. 02/16/18; the patient has finished his doxycycline and will finish the amoxicillin 2 days. The area of cellulitis in the left calf posteriorly has resolved. He is no longer having any pain. He tells me he is using his compression pumps at least once a day sometimes twice. 02/23/18; the patient finished his doxycycline and Amoxil last week. On Friday he noticed a small erythematous circle about the size of a quarter on the left lower leg just above his ankle. This rapidly expanded and he now has erythema on the lateral and posterior part of the thigh. This is bright red. Also has an area on the dorsal foot just above his toes and a tender area just below the left popliteal fossa. He came off his prophylactic penicillin injections at his own insistence one or 2 months ago. This is obviously deteriorated since then 03/02/18; patient is on doxycycline and Amoxil. Culture I did last week of the weeping area on the back of his left calf grew group B strep. I have therefore renewed the amoxicillin 500 3 times a day for a further week. He has not been systemically unwell. Still complaining of an area of discomfort right under his left popliteal fossa. There is no open wound on the right leg. He tells me that he is using his pumps twice a day on most days 03/09/18; patient arrives in clinic today completing his amoxicillin today. The  cellulitis on his left leg is better. Furthermore he  tells me that he had intramuscular penicillin shots that his primary care office today. However he also states that the wrap on his right leg fell down shortly after leaving clinic last week. He developed a large blister that was present when he came in for a nurse visit later in the week and then he developed intense discomfort around this area.He tells me he is using his compression pumps 03/16/18; the patient has completed his doxycycline. The infectious part of this/cellulitis in the left heel area left popliteal area is a lot better. He has 2 open areas on the right calf. Still areas on the left calf but this is a lot better as well. 03/24/18; the patient arrives complaining of pain in the left popliteal area again. He thinks some of this is wrap injury. He has no open area on the right leg and really no open area on the left calf either except for the popliteal area. He claims to be compliant with the compression pumps 03/31/18; I gave him doxycycline last week because of cellulitis in the left popliteal area. This is a lot better although the surface epithelium is denuded off and response to this. He arrives today with uncontrolled edema in the right calf area as well as a fingernail injury in the right lateral calf. There is only a few open areas on the left 04/06/18; I gave him amoxicillin doxycycline over the last 2 weeks that the amoxicillin should be completing currently. He is not complaining of any pain or systemic symptoms. The only open areas see has is on the right lateral lower leg paradoxically I cannot see anything on the left lower leg. He tells me he is using his compression pumps twice a day on most days. Silver alginate to the wounds that are open under 4 layer compression 04/13/18; he completed antibiotics and has no new complaints. Using his compression pumps. Silver alginate that anything that's opened 04/20/18; he is using his compression pumps religiously. Silver alginate 4 layer  compression anything that's opened. He comes in today with no open wounds on the left leg but 3 on the right including a new one posteriorly. He has 2 on the right lateral and one on the right posterior. He likes Unna boots on the right leg for reasons that aren't really clear we had the usual 4 layer compression on the left. It may be necessary to move to the 4 layer compression on the right however for now I left them in the Unna boots 04/27/18; he is using his compression pumps at least once a day. He has still the wounds on the right lateral calf. The area right posteriorly has closed. He does not have an open wound on the left under 4 layer compression however on the dorsal left foot just proximal to the toes and the left third toe 2 small open areas were identified 05/11/18; he has not uses compression pumps. The areas on the right lateral calf have coalesced into one large wound necrotic surface. On the left side he has one small wound anteriorly however the edema is now weeping out of a large part of his left leg. He says he wasn't using his pumps because of the weeping fluid. I explained to him that this is the time he needs to pump more 05/18/18; patient states he is using his compression pumps twice a day. The area on the right lateral large wound albeit superficial.  On the left side he has innumerable number of small new wounds on the left calf particularly laterally but several anteriorly and medially. All these appear to have healthy granulated base these look like the remnants of blisters however they occurred under compression. The patient arrives in clinic today with his legs somewhat better. There is certainly less edema, less multiple open areas on the left calf and the right anterior leg looks somewhat better as well superficial and a little smaller. However he relates pain and erythema over the last 3-4 days in the thigh and I looked at this today. He has not been systemically unwell  no fever no chills no change in blood sugar values 05/25/18; comes in today in a better state. The severe cellulitis on his left leg seems better with the Keflex. Not as tender. He has not been systemically unwell Hard to find an open wound on the left lower leg using his compression pumps twice a day The confluent wounds on his right lateral calf somewhat better looking. These will ultimately need debridement I didn't do this today. 06/01/18; the severe cellulitis on the left anterior thigh has resolved and he is completed his Keflex. There is no open wound on the left leg however there is a superficial excoriation at the base of the third toe dorsally. Skin on the bottom of his left foot is macerated looking. The left the wounds on the lateral right leg actually looks some better although he did require debridement of the top half of this wound area with an open curet 06/09/18 on evaluation today patient appears to be doing poorly in regard to his right lower extremity in particular this appears to likely be infected he has very thick purulent discharge along with a bright green tent to the discharge. This makes me concerned about the possibility of pseudomonas. He's also having increased discomfort at this point on evaluation. Fortunately there does not appear to be any evidence of infection spreading to the other location at this time. 06/16/18 on evaluation today patient appears to actually be doing fairly well. His ulcer has actually diminished in size quite significantly at this point which is good news. Nonetheless he still does have some evidence of infection he did see infectious disease this morning before coming here for his appointment. I did review the results of their evaluation and their note today. They did actually have him discontinue the Cipro and initiate treatment with linezolid at this time. He is doing this for the next seven days and they recommended a follow-up in four months with  them. He is the keep a log of the need for intermittent antibiotic therapy between now and when he falls back up with infectious disease. This will help them gaze what exactly they need to do to try and help them out. 06/23/18; the patient arrives today with no open wounds on the left leg and left third toe healed. He is been using his compression pumps twice a day. On the right lateral leg he still has a sizable wound but this is a lot better than last time I saw this. 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 Has been using silver alginate under 4 layer compression 06/30/18; the only open wound he has is on the right lateral leg and this looks healthy. No debridement is required. We have been using silver alginate. He does not  have an open wound on the left leg. There is apparently some drainage from the dorsal proximal third toe on the left although I see no open wound here. 07/03/18 on evaluation today patient was actually here just for a nurse visit rapid change. However when he was here on Wednesday for his rat change due to having been healed on the left and then developing blisters we initiated the wrap again knowing that he would be back today for [place] to reevaluate and see were at. Unfortunately he has developed some cellulitis into the proximal portion of his right lower extremity even into the region of his thigh. 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 He was placed on one as what at that point. Nonetheless he is done with that and has been tolerating it well otherwise. Doxycycline which in the past really did not seem to be effective for him. Nonetheless I think the best option may be for [place] to definitely reinitiate the antibiotics for a longer period of time. 07/07/18; since I last saw this patient a week ago he has had a difficult time. At that  point he did not have an open wound on his left leg. We transitioned him into juxta light stockings. He was apparently in the clinic the next day with blisters on the left lateral and left medial lower calf. He also had weeping edema fluid. He was put back into a compression wrap. He was also in the clinic on Friday with intense erythema in his right thigh. Per the patient he was started on Bactrim however that didn't work at all in terms of relieving his pain and swelling. He has taken 3 doxycycline that he had left over from last time and that seems to of helped. He has blistering on the right thigh as well. 07/14/18; the erythema on his right thigh has gotten better with doxycycline that he is finishing. 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 Presumably this cellulitis in the thigh was not related to that although I think this is a bit concerning going forward. He still has an area on the right lateral calf the blister on the right medial calf just below the knee that was discussed above. On the left 2 small open areas left medial and left lateral. Edema control is adequate. He is using his compression pumps twice a day 07/20/18; continued improvement in the condition of both legs especially the edema in his bilateral thighs. He tells me he is been losing weight through a combination of diet and exercise. He is using his compression pumps twice a day. So overall she made to the remaining wounds 07/27/2018; continued improvement in condition of both legs. His edema is well controlled. The area on the right lateral leg is just about closed he had one blisters show up on the medial left upper calf. We have him in 4 layer compression. He is going on a 10-day trip to [place] and [place]. He will be driving. He wants to wear Unna boots because of the lessening amount of constriction. He will not use compression  pumps while he is away 08/05/18 on evaluation today patient actually appears to be doing decently well all things considered in regard to his bilateral lower extremities. The worst ulcer is actually only posterior aspect of his left lower extremity with a four  layer compression wrap cut into his leg a couple weeks back. He did have a trip and actually had [employer] for the trip that he is worn since he was last here. Nonetheless he feels like the [employer] actually do better for him his swelling is up a little bit but he also with his trip was not taking his Lasix on a regular set schedule like he was supposed to be. He states that obviously the reason being that he cannot drive and keep going without having to urinate too frequently which makes it difficult. He did not have his pumps with him while he was away either which I think also maybe playing a role here too. 08/13/2018; the patient only has a small open wound on the right lateral calf which is a big improvement in the last month or 2. He also has the area posteriorly just below the posterior fossa on the left which I think was a wrap injury from several weeks ago. He has no current evidence of cellulitis. He tells me he is back into his compression pumps twice a day. He also tells me that while he was at the laundromat somebody stole a section of his extremitease stockings 08/20/2018; back in the clinic with a much improved state. He only has small areas on the right lateral mid calf which is just about healed. This was is more substantial area for quite a prolonged period of time. He has a small open area on the left anterior tibia. The area on the posterior calf just below the popliteal fossa is closed today. He is using his compression pumps twice a day 08/28/2018; patient has no open wound on the right leg. He has a smattering of open areas on the calf with some weeping lymphedema. More problematically than that it looks as  though his wraps of slipped down in his usual he has very angry upper area of edema just below the right medial knee and on the right lateral calf. He has no open area on his feet. The patient is traveling to [facility] next week. I will send him in an antibiotic. We will continue to wrap the right leg. We ordered extremitease stockings for him last week and I plan to transition the right leg to a stocking when he gets home which will be in 10 days time. As usual he is very reluctant to take his pumps with him when he travels 09/07/2018; patient returns from [facility]. He shows me a picture of his left leg in the mid part of his trip last week with intense fire engine erythema. The picture look bad enough I would have considered sending him to the hospital. Instead he went to the wound care center in [facility]. They did not prescribe him antibiotics but he did take some doxycycline he had leftover from a previous visit. I had given him trimethoprim sulfamethoxazole before he left this did not work according to the patient. This is resulted in some improvement fortunately. He comes back with a large wound on the left posterior calf. Smaller area on the left anterior tibia. Denuded blisters on the dorsal left foot over his toes. Does not have much in the way of wounds on the right leg although he does have a very tender area on the right posterior area just below the popliteal fossa also suggestive of infection. He promises me he is back on his pumps twice a day 09/15/2018; the intense  cellulitis in his left lower calf is a lot better. The wound area on the posterior left calf is also so better. However he has reasonably extensive wounds on the dorsal aspect of his second and third toes and the proximal foot just at the base of the toes. There is nothing open on the right leg 09/22/2018; the patient has excellent edema control in his legs bilaterally. He is using his external compression  pumps twice a day. He has no open area on the right leg and only the areas in the left foot dorsally second and third toe area on the left side. He does not have any signs of active cellulitis. 10/06/2018; the patient has good edema control bilaterally. He has no open wound on the right leg. There is a blister in the posterior aspect of his left calf that we had to deal with today. He is using his compression pumps twice a day. There is no signs of active cellulitis. We have been using silver alginate to the wound areas. He still has vulnerable areas on the base of his left first second toes dorsally He has a his extremities stockings and we are going to transition him today into the stocking on the right leg. He is cautioned that he will need to continue to use the compression pumps twice a day. If he notices uncontrolled edema in the right leg he may need to go to 3 times a day. 10/13/2018; the patient came in for a nurse check on Friday he has a large flaccid blister on the right medial calf just below the knee. We unroofed this. He has this and a new area underneath the posterior mid calf which was undoubtedly a blister as well. He also has several small areas on the right which is the area we put his extremities stocking on. 10/19/2018; the patient went to see infectious disease this morning I am not sure if that was a routine follow-up in any case the doxycycline I had given him was discontinued and started on linezolid. He has not started this. It is easy to look at his left calf and the inflammation and think this is cellulitis however he is very tender in the tissue just below the popliteal fossa and I have no doubt that there is infection going on here. He states the problem he is having is that with the compression pumps the edema goes down and then starts walking the wrap falls down. We will see if we can adhere this. He has 1 or 2 minuscule open areas on the right still areas that are  weeping on the posterior left calf, the base of his left second and third toes 10/26/18; back today in clinic with quite of skin breakdown in his left anterior leg. This may have been infection the area below the popliteal fossa seems a lot better however tremendous epithelial loss on the left anterior mid tibia area over quite inexpensive tissue. He has 2 blisters on the right side but no other open wound here. 10/29/2018; came in urgently to see [place] today and we worked him in for review. He states that the 4 layer compression on the right leg caused pain he had to cut it down to roughly his mid calf this caused swelling above the wrap and he has blisters and skin breakdown today. As a result of the pain he has not been using his pumps. Both legs are a lot more edematous and there is a lot of  weeping fluid. 11/02/18; arrives in clinic with continued difficulties in the right leg> left. Leg is swollen and painful. multiple skin blisters and new open areas especially laterally. He has not been using his pumps on the right leg. 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 He is not systemically unwell. 11/09/2018; the patient claims he is being compliant with his pumps. He is finished the doxycycline I gave him last week. Culture I did of the wound on the right lateral leg showed a few very resistant methicillin staph aureus. This was resistant to doxycycline. Nevertheless he states the pain in the leg is a lot better which makes me wonder if the cultured organism was not really what was causing the problem nevertheless this is a very dangerous organism to be culturing out of any wound. His right leg is still a lot larger than the left. He is using an Radio broadcast assistant on this area, he blames a 4-layer compression for causing the original skin breakdown which I doubt is true however I cannot talk him out of it. We have been using silver alginate to all of these areas  which were initially blisters 11/16/2018; patient is being compliant with his external compression pumps at twice a day. Miraculously he arrives in clinic today with absolutely no open wounds. He has better edema control on the left where he has been using 4 layer compression versus wound of wounds on the right and I pointed this out to him. There is no inflammation in the skin in his lower legs which is also somewhat unusual for him. There is no open wounds on the dorsal left foot. He has extremitease stockings at home and I have asked him to bring these in next week. 11/25/18 patient's lower extremity on examination today on the left appears for the most part to be wound free. He does have an open wound on the lateral aspect of the right lower extremity but this is minimal compared to what I've seen in past. He does request that we go ahead and wrap the left leg as well even though there's nothing open just so hopefully it will not reopen in short order. 1/28; patient has superficial open wounds on the right lateral calf left anterior calf and left posterior calf. His edema control is adequate. 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 We have been using silver alginate as the primary dressing. He claims compliance with his compression pumps 2/4; patient has superficial open wounds on numerous areas of his left calf and again one on the left dorsal foot. The areas on the right lateral calf have healed. The cellulitis that I gave him doxycycline for last week is also resolved this was mostly on the left anterior calf just below the tibial tuberosity. His edema looks fairly well-controlled. He tells me he went to see his primary doctor today and had blood work ordered 2/11; once again he has several open areas on the left calf left tibial area. Most of these are small and appear to have healthy granulation. He does not  have anything open on the right. The edema and control in his thighs is pretty good which is usually a good indication he has been using his pumps as requested. 2/18; he continues to have several small areas on the left calf and left tibial area. Most of these are small healthy  granulation. We put him in his stocking on the right leg last week and he arrives with a superficial open area over the right upper tibia and a fairly large area on the right lateral tibia in similar condition. His edema control actually does not look too bad, he claims to be using his compression pumps twice a day 2/25. Continued small areas on the left calf and left tibial area. New areas especially on the right are identified just below the tibial tuberosity and on the right upper tibia itself. There are also areas of weeping edema fluid even without an obvious wound. He does not have a considerable degree of lymphedema but clearly there is more edema here than his skin can handle. He states he is using the pumps twice a day. We have an Unna boot on the right and 4 layer compression on the left. 3/3; he continues to have an area on the right lateral calf and right posterior calf just below the popliteal fossa. There is a fair amount of tenderness around the wound on the popliteal fossa but I did not see any evidence of cellulitis, could just be that the wrap came down and rubbed in this area. He does not have an open area on the left leg however there is an area on the left dorsal foot at the base of the third toe We have been using silver alginate to all wound areas 3/10; he did not have an open area on his left leg last time he was here a week ago. T oday he arrives with a horizontal wound just below the tibial tuberosity and an area on the left lateral calf. He has intense erythema and tenderness in this area. The area is on the right lateral calf and right posterior calf better than last week. We have been using silver  alginate as usual 3/18 - Patient returns with 3 small open areas on left calf, and 1 small open area on right calf, the skin looks ok with no significant erythema, he continues the UNA boot on right and 4 layer compression on left. The right lateral calf wound is closed , the right posterior is small area. we will continue silver alginate to the areas. 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 He is been tolerating the dressing changes without complication. Fortunately she has made excellent improvement in regard to the overall status of his wounds. Unfortunately every time we cease wrapping him he ends up reopening in causing more significant issues at that point. Again I'm unsure of the best direction to take although I think the lymphedema clinic may be appropriate for him. 02/03/19 on evaluation today patient appears to be doing well in regard to the wounds that we saw him for last week unfortunately he has a new area on the proximal portion of his right medial/posterior lower extremity where the wrap somewhat slowed down and caused swelling and a blister to rub and open. Unfortunately this is the only opening that he has on either leg at this point. 02/17/19 on evaluation today patient's bilateral lower extremities appear to be doing well. He still completely healed in regard to the left lower extremity. In regard to the right lower extremity the area where the wrap and slid down and caused the blister still seems to  be slightly open although this is dramatically better than during the last evaluation two weeks ago. I'm very pleased with the way this stands overall. 03/03/19 on evaluation today patient appears to be doing well in regard to his right lower extremity in general although he did have a new  blister open this does not appear to be showing any evidence of active infection at this time. Fortunately there's No fevers, chills, nausea, or vomiting noted at this time. Overall I feel like he is making good progress it does feel like that the right leg will we perform the [employer] seems to do with a bit better than three layer wrap on the left which slid down on him. We may switch to doing bilateral in the book wraps. 5/4; I have not seen Mr. Creeden in quite some time. According to our case manager he did not have an open wound on his left leg last week. He had 1 remaining wound on the right posterior medial calf. He arrives today with multiple openings on the left leg probably were blisters and/or wrap injuries from Unna boots. I do not think the Unna boot's will provide adequate compression on the left. I am also not clear about the frequency he is using the compression pumps. 03/17/19 on evaluation today patient appears to be doing excellent in regard to his lower extremities compared to last week's evaluation apparently. He had gotten significantly worse last week which is unfortunate. The [employer] wrap on the left did not seem to do very well for him at all and in fact it didn't control his swelling significantly enough he had an additional outbreak. Subsequently we go back to the four layer compression wrap on the left. This is good news. At least in that he is doing better and the wound seem to be killing him. He still has not heard anything from the lymphedema clinic. 03/24/19 on evaluation today patient actually appears to be doing much better in regard to his bilateral lower Trinity as compared to last week when I saw him. Fortunately there's no signs of active infection at this time. He has been tolerating the dressing changes without complication. Overall I'm extremely pleased with the progress and appearance in general. 04/07/19 on evaluation today patient appears to be doing well in  regard to his bilateral lower extremities. His swelling is significantly down from where it was previous. With that being said he does have a couple blisters still open at this point but fortunately nothing that seems to be too severe and again the majority of the larger openings has healed at this time. 04/14/19 on evaluation today patient actually appears to be doing quite well in regard to his bilateral lower extremities in fact I'm not even sure there's anything significantly open at this time at any site. Nonetheless he did have some trouble with these wraps where they are somewhat irritating him secondary to the fact that he has noted that the graph wasn't too close down to the end of this foot in a little bit short as well up to his knee. Otherwise things seem to be doing quite well. 04/21/19 upon evaluation today patient's wound bed actually showed evidence of being completely healed in regard to both lower extremities which is excellent news. There does not appear to be any signs of active infection which is also good news. I'm very pleased in this regard. No fevers, chills, nausea, or vomiting noted at this time. 04/28/19 on evaluation today  patient appears to be doing a little bit worse in regard to both lower extremities on the left mainly due to the fact that when he went infection disease the wrap was not wrapped quite high enough he developed a blister above this. On the right he is a small open area of nothing too significant but again this is continuing to give him some trouble he has been were in the Velcro compression that he has at home. 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 He's been tolerating the dressing changes without complication. Fortunately there's no signs of active infection at this time. No fevers, chills, nausea, or vomiting noted at this time. We have been trying to get an appointment with her lymphedema  clinic in [facility] but unfortunately nobody can get them on phone with not been able to even fax information over the patient likewise is not been able to get in touch with them. Overall I'm not sure exactly what's going on here with to reach out again today. 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 Still having a lot of drainage unfortunately. He tells me especially in the left but even on the right. There's no signs of active infection which is good news we've been using so ratcheted up to this point. 05/19/19 on evaluation today patient actually appears to be doing quite well with regard to his left lower extremity which is great news. Fortunately in regard to the right lower extremity has an issues with his wrap and he subsequently did remove this from what I'm understanding. Nonetheless long story short is what he had rewrapped once he removed it subsequently had maggots underneath this wrap whenever he came in for evaluation today. With that being said they were obviously completely cleaned away by the nursing staff. The visit today which is excellent news. However he does appear to potentially have some infection around the right ankle region where the maggots were located as well. He will likely require anabiotic therapy today. 05/26/19 on evaluation today patient actually appears to be doing much better in regard to his bilateral lower extremities. I feel like the infection is under much better control. With that being said there were maggots noted when the wrap was removed yet again today. Again this could have potentially been left over from previous although at this time there does not appear to be any signs of significant drainage there was obviously on the wrap some drainage as well this contracted gnats or otherwise. Either way I do not see anything that appears to be doing worse in my  pinion and in fact I think his drainage has slowed down quite significantly likely mainly due to the fact to his infection being under better control. 06/02/2019 on evaluation today patient actually appears to be doing well with regard to his bilateral lower extremities there is no signs of active infection at this time which is great news. With that being said he does have several open areas more so on the right than the left but nonetheless these are all significantly better than previously noted. 06/09/2019 on evaluation today patient actually appears to be doing well. His wrap stayed up and he did not cause any problems he had more drainage on the right compared to the left but overall I do not see any major issues at this time which  is great news. 06/16/2019 on evaluation today patient appears to be doing excellent with regard to his lower extremities the only area that is open is a new blister that can have opened as of today on the medial ankle on the left. Other than this he really seems to be doing great I see no major issues at this point. 06/23/2019 on evaluation today patient appears to be doing quite well with regard to his bilateral lower extremities. In fact he actually appears to be almost completely healed there is a small area of weeping noted of the right lower extremity just above the ankle. Nonetheless fortunately there is no signs of active infection at this time which is good news. No fevers, chills, nausea, vomiting, or diarrhea. 8/24; the patient arrived for a nurse visit today but complained of very significant pain in the left leg and therefore I was asked to look at this. Noted that he did not have an open area on the left leg last week nevertheless this was wrapped. The patient states that he is not been able to put his compression pumps on the left leg because of the discomfort. He has not been systemically unwell 06/30/2019 on evaluation today patient unfortunately despite  being excellent last week is doing much worse with regard to his left lower extremity today. In fact he had to come in for a nurse on Monday where his left leg had to be rewrapped due to excessive weeping Dr. Leanord Hawking placed him on doxycycline at that point. Fortunately there is no signs of active infection Systemically at this time which is good news. 07/07/2019 in regard to the patient's wounds today he actually seems to be doing well with his right lower extremity there really is nothing open or draining at this point this is great news. Unfortunately the left lower extremity is given him additional trouble at this time. There does not appear to be any signs of active infection nonetheless he does have a lot of edema and swelling noted at this point as well as blistering all of which has led to a much more poor appearing leg at this time compared to where it was 2 weeks ago when it was almost completely healed. Obviously this is a little discouraging for the patient. He is try to contact the lymphedema clinic in [place] he has not been able to get through to them. 07/14/2019 on evaluation today patient actually appears to be doing slightly better with regard to his left lower extremity ulcers. Overall I do feel like at least at the top of the wrap that we have been placing this area has healed quite nicely and looks much better. The remainder of the leg is showing signs of improvement. Unfortunately in the thigh area he still has an open region on the left and again on the right he has been utilizing just a Band-Aid on an area that also opened on the thigh. Again this is an area that were not able to wrap although we did do an Ace wrap to provide some compression that something that obviously is a little less effective than the compression wraps we have been using on the lower portion of the leg. He does have an appointment with the lymphedema clinic in [facility] on Friday. 07/21/2019 on  evaluation today patient appears to be doing better with regard to his lower extremity ulcers. He has been tolerating the dressing changes without complication. Fortunately there is no signs of active infection at this  time. No fevers, chills, nausea, vomiting, or diarrhea. I did receive the paperwork from the physical therapist at the lymphedema clinic in [place]. Subsequently I signed off on that this morning and sent that back to him for further progression with the treatment plan. 07/28/2019 on evaluation today patient appears to be doing very well with regard to his right lower extremity where I do not see any open wounds at this point. Fortunately he is feeling great as far as that is concerned as well. In regard to the left lower extremity he has been having issues with still several areas of weeping and edema although the upper leg is doing better his lower leg still I think is going require the compression wrap at this time. No fevers, chills, nausea, vomiting, or diarrhea. 08/04/2019 on evaluation today patient unfortunately is having new wounds on the right lower extremity. Again we have been using Unna boot wrap on that side. We switched him to using his juxta lite wrap at home. With that being said he tells me he has been using it although his legs extremely swollen and to be honest really does not appear that he has been. I cannot know that for sure however. Nonetheless he has multiple new wounds on the right lower extremity at this time. Obviously we will have to see about getting this rewrapped for him today. 08/11/2019 on evaluation today patient appears to be doing fairly well with regard to his wounds. He has been tolerating the dressing changes including the compression wraps without complication. He still has a lot of edema in his upper thigh regions bilaterally he is supposed to be seeing the lymphedema clinic on the 15th of this month once his wraps arrive for the upper part  of his legs. 08/18/2019 on evaluation today patient appears to be doing well with regard to his bilateral lower extremities at this point. He has been tolerating the dressing changes without complication. Fortunately there is no signs of active infection which is also good news. He does have a couple weeping areas on the first and second toe of the right foot he also has just a small area on the left foot upper leg and a small area on the left lower leg but overall he is doing quite well in my opinion. He is supposed to be getting his wraps shortly in fact tomorrow and then subsequently is seeing the lymphedema clinic next Wednesday on the 21st. Of note he is also leaving on the 25th to go on vacation for a week to the beach. For that reason and since there is some uncertainty about what there can be doing at lymphedema clinic next Wednesday I am get a make an appointment for next Friday here for [place] to see what we need to do for him prior to him leaving for vacation. 10/23; patient arrives in considerable pain predominantly in the upper posterior calf just distal to the popliteal fossa also in the wound anteriorly above the major wound. This is probably cellulitis and he has had this recurrently in the past. He has no open wound on the right side and he has had an Radio broadcast assistant in that area. Finally I note that he has an area on the left posterior calf which by enlarge is mostly epithelialized. This protrudes beyond the borders of the surrounding skin in the setting of dry scaly skin and lymphedema. The patient is leaving for [facility] on Sunday. Per his longstanding pattern, he will not take  his compression pumps with him predominantly out of fear that they will be stolen. He therefore asked that we put a Unna boot back on the right leg. He will also contact the wound care center in [facility] to see if they can change his dressing in the mid week. 11/3; patient returned from his vacation to [facility]. He was seen on 1 occasion at their wound care center. They did a 2 layer compression system as they did not have our 4-layer wrap. I am not completely certain what they put on the wounds. They did not change the Unna boot on the right. The patient is also seeing a lymphedema specialist physical therapist in [place]. It appears that he has some compression sleeve for his thighs which indeed look quite a bit better than I am used to seeing. He pumps over these with his external compression pumps. 11/10; the patient has a new wound on the right medial thigh otherwise there is no open areas on the right. He has an area on the left leg posteriorly anteriorly and medially and an area over the left second toe. We have been using silver alginate. He thinks the injury on his thigh is secondary to friction from the compression sleeve he has. 11/17; the patient has a new wound on the right medial thigh last week. He thinks this is because he did not have a underlying stocking for his thigh juxta lite apparatus. He now has this. The area is fairly large and somewhat angry but I do not think he has underlying cellulitis. He has a intact blister on the right anterior tibial area. Small wound on the right great toe dorsally Small area on the medial left calf. 11/30; the patient does not have any open areas on his right leg and we did not take his juxta lite stocking off. However he states that on Friday his compression wrap fell down lodging around his upper mid calf area. As usual this creates a lot of problems for him. He called urgently today to be seen for a nurse visit however the nurse visit turned into a provider visit because of extreme erythema and pain in the left anterior tibia extending laterally and posteriorly. The area that is problematic is extensive 10/06/2019 upon evaluation today patient actually appears to be doing poorly in regard to his left lower extremity. He Dr. Leanord Hawking did place  him on doxycycline this past Monday apparently due to the fact that he was doing much worse in regard to this left leg. Fortunately the doxycycline does seem to be helping. Unfortunately we are still having a very difficult time getting his edema under any type of control in order to anticipate discharge at some point. The only way were really able to control his lymphedema really is with compression wraps and that has only even seemingly temporary. He has been seeing a lymphedema clinic they are trying to help in this regard but still this has been somewhat frustrating in general for the patient. 10/13/19 on evaluation today patient appears to be doing excellent with regard to his right lower extremity as far as the wounds are concerned. His swelling is still quite extensive unfortunately. He is still having a lot of drainage from the thigh areas bilaterally which is unfortunate. He's been going to lymphedema clinic but again he still really does not have this edema under control as far as his lower extremities are concern. With regard to his left lower extremity this seems  to be improving and I do believe the doxycycline has been of benefit for him. He is about to complete the doxycycline. 10/20/2019 on evaluation today patient appears to be doing poorly in regard to his bilateral lower extremities. More in the right thigh he has a lot of irritation at this site unfortunately. In regard to the left lower extremity the wrap was not quite as high it appears and does seem to have caused him some trouble as well. Fortunately there is no evidence of systemic infection though he does have some blue-green drainage which has me concerned for the possibility of Pseudomonas. He tells me he is previously taking Cipro without complications and he really does not care for Levaquin however due to some of the side effects he has. He is not allergic to any medications specifically antibiotics that were aware  of. 10/27/2019 on evaluation today patient actually does appear to be for the most part doing better when compared to last week's evaluation. With that being said he still has multiple open wounds over the bilateral lower extremities. He actually forgot to start taking the Cipro and states that he still has the whole bottle. He does have several new blisters on left lower extremity today I think I would recommend he go ahead and take the Cipro based on what I am seeing at this point. 12/30-Patient comes at 1 week visit, 4 layer compression wraps on the left and Unna boot on the right, primary dressing Xtrasorb and silver alginate. Patient is taking his Cipro and has a few more days left probably 5-6, and the legs are doing better. He states he is using his compressions devices which I believe he has 11/10/2019 on evaluation today patient actually appears to be much better than last time I saw him 2 weeks ago. His wounds are significantly improved and overall I am very pleased in this regard. Fortunately there is no signs of active infection at this time. He is just a couple of days away from completing Cipro. Overall his edema is much better he has been using his lymphedema pumps which I think is also helping at this point. 11/17/2019 on evaluation today patient appears to be doing excellent in regard to his wounds in general. His legs are swollen but not nearly as much as they have been in the past. Fortunately he is tolerating the compression wraps without complication. No fevers, chills, nausea, vomiting, or diarrhea. He does have some erythema however in the distal portion of his right lower extremity specifically around the forefoot and toes there is a little bit of warmth here as well. 11/24/2019 on evaluation today patient appears to be doing well with regard to his right lower extremity I really do not see any open wounds at this point. His left lower extremity does have several open areas and his  right medial thigh also is open. Other than this however overall the patient seems to be making good progress and I am very pleased at this point. 12/01/2019 on evaluation today patient appears to be doing poorly at this point in regard to his left lower extremity has several new blisters despite the fact that we have him in compression wraps. In fact he had a 4-layer compression wrap, his upper thigh wrapped from lymphedema clinic, and a juxta light over top of the 4 layer compression wrap the lymphedema clinic applied and despite all this he still develop blisters underneath. Obviously this does have me concerned about the fact that  unfortunately despite what we are doing to try to get wounds healed he continues to have new areas arise I do not think he is ever good to be at the point where he can realistically just use wraps at home to keep things under control. Typically when we heal him it takes about 1-2 days before he is back in the clinic with severe breakdown and blistering of his lower extremities bilaterally. This is happened numerous times in the past. Unfortunately I think that we may need some help as far as overall fluid overload to kind of limit what we are seeing and get things under better control. 12/08/2019 on evaluation today patient presents for follow-up concerning his ongoing bilateral lower extremity edema. Unfortunately he is still having quite a bit of swelling the compression wraps are controlling this to some degree but he did see Dr. [name] his cardiologist I do have that available for review today as far as the appointment was concerned that was on 12/06/2019. Obviously that she has been 2 days ago. The patient states that he is only been taking the Lasix 80 mg 1 time a day he had told me previously he was taking this twice a day. Nonetheless Dr. [name] recommended this be up to 80 mg 2 times a day for the patient as he did appear to be fluid overloaded. With that being said the  patient states he did this yesterday and he was unable to go anywhere or do anything due to the fact that he was constantly having to urinate. Nonetheless I think that this is still good to be something that is important for him as far as trying to get his edema under control at all things that he is going to be able to just expect his wounds to get under control and things to be better without going through at least a period of time where he is trying to stabilize his fluid management in general and I think increasing the Lasix is likely the first step here. It was also mentioned the possibility that the patient may require metolazone. With that being said he wanted to have the patient take Lasix twice a day first and then reevaluating 2 months to see where things stand. 12/15/2019 upon evaluation today patient appears to be doing regard to his legs although his toes are showing some signs of weeping especially on the left at this point to some degree on the right. There does not appear to be any signs of active infection and overall I do feel like the compression wraps are doing well for him but he has not been able to take the Lasix at home and the increased dose that Dr. [name] recommended. He tells me that just not go to be feasible for him. Nonetheless I think in this case he should probably send a message to Dr. [name] in order to discuss options from the standpoint of possible admission to get the fluid off or otherwise going forward. 12/22/2019 upon evaluation today patient appears to be doing fairly well with regard to his lower extremities at this point. In fact he would be doing excellent if it was not for the fact that his right anterior thigh apparently had an allergic reaction to adhesive tape that he used. The wound itself that we have been monitoring actually appears to be healed. There is a lot of irritation at this point. 12/29/2019 upon evaluation today patient appears to be doing well  in regard to his  lower extremities. His left medial thigh is open and somewhat draining today but this is the only region that is open the right has done much better with the treatment utilizing the steroid cream that I prescribed for him last week. Overall I am pleased in that regard. Fortunately there is no signs of active infection at this time. No fevers, chills, nausea, vomiting, or diarrhea. 01/05/2020 upon evaluation today patient appears to be doing more poorly in regard to his right lower extremity at this point upon evaluation today. Unfortunately he continues to have issues in this regard and I think the biggest issue is controlling his edema. This obviously is not very well controlled at this point is been recommended that he use the Lasix twice a day but he has not been able to do that. Unfortunately I think this is leading to an issue where honestly he is not really able to effectively control his edema and therefore the wounds really are not doing significantly better. I do not think that he is going to be able to keep things under good control unless he is able to control his edema much better. I discussed this again in great detail with him today. 01/12/2020 good news is patient actually appears to be doing quite well today at this point. He does have an appointment with lymphedema clinic tomorrow. His legs appear healed and the toe on the left is almost completely healed. In general I am very pleased with how things stand at this point. 01/19/2020 upon evaluation today patient appears to actually be doing well in regard to his lower extremities there is nothing open at this point. Fortunately he has done extremely well more recently. Has been seeing lymphedema clinic as well. With that being said he has Velcro wraps for his lower legs as well as his upper legs. The only wound really is on his toe which is the right great toe and this is barely anything even there. With all that being said I  think it is good to be appropriate today to go ahead and switch him over to the Velcro compression wraps. 01/26/2020 upon evaluation today patient appears to be doing worse with regard to his lower extremities after last week switch him to Velcro compression wraps. Unfortunately he lasted less than 24 hours he did not have the sock portion of his Velcro wrap on the left leg and subsequently developed a blister underneath the Velcro portion. Obviously this is not good and not what we were looking for at this point. He states the lymphedema clinic did tell him to wear the wrap for 23 hours and take him off for 1 I am okay with that plan but again right now we got a get things back under control again he may have some cellulitis noted as well. 02/02/2020 upon evaluation today patient unfortunately appears to have several areas of blistering on his bilateral lower extremities today mainly on the feet. His legs do seem to be doing somewhat better which is good news. Fortunately there is no evidence of active infection at this time. No fevers, chills, nausea, vomiting, or diarrhea. 02/16/2020 upon evaluation today patient appears to be doing well at this time with regard to his legs. He has a couple weeping areas on his toes but for the most part everything is doing better and does appear to be sealed up on his legs which is excellent news. We can continue with wrapping him at this point as he had every  time we discontinue the wraps he just breaks out with new wounds. There is really no point in is going forward with this at this point. 03/08/2020 upon evaluation today patient actually appears to be doing quite well with regard to his lower extremity ulcers. He has just a very superficial and really almost nonexistent blister on the left lower extremity he has in general done very well with the compression wraps. With that being said I do not see any signs of infection at this time which is good news. 03/29/2020  upon evaluation today patient appears to be doing well with regard to his wounds currently except for where he had several new areas that opened up due to some of the wrap slipping and causing him trouble. He states he did not realize they had slipped. Nonetheless he has a 1 area on the right and 3 new areas on the left. Fortunately there is no signs of active infection at this time which is great news. 04/05/2020 upon evaluation today patient actually appears to be doing quite well in general in regard to his legs currently. Fortunately there is no signs of active infection at this time. No fevers, chills, nausea, vomiting, or diarrhea. He tells me next week that he will actually be seen in the lymphedema clinic on Thursday at 10 AM I see him on Wednesday next week. 04/12/2020 upon evaluation today patient appears to be doing very well with regard to his lower extremities bilaterally. In fact he does not appear to have any open wounds at this point which is good news. Fortunately there is no signs of active infection at this time. No fevers, chills, nausea, vomiting, or diarrhea. 04/19/2020 upon evaluation today patient appears to be doing well with regard to his wounds currently on the bilateral lower extremities. There does not appear to be any signs of active infection at this time. Fortunately there is no evidence of systemic infection and overall very pleased at this point. Nonetheless after I held him out last week he literally had blisters the next morning already which swelled up with him being right back here in the clinic. Overall I think that he is just not can be able to be discharged with his legs the way they are he is much to volume overloaded as far as fluid is concerned and that was discussed with him today of also discussed this but should try the clinic nurse manager as well as Dr. Leanord Hawking. 04/26/2020 upon evaluation today patient appears to be doing better with regard to his wounds  currently. He is making some progress and overall swelling is under good control with the compression wraps. Fortunately there is no evidence of active infection at this time. 05/10/2020 on evaluation today patient appears to be doing overall well in regard to his lower extremities bilaterally. He is Tolerating the compression wraps without complication and with what we are seeing currently I feel like that he is making excellent progress. There is no signs of active infection at this time. 05/24/2020 upon evaluation today patient appears to be doing well in regard to his legs. The swelling is actually quite a bit down compared to where it has been in the past. Fortunately there is no sign of active infection at this time which is also good news. With that being said he does have several wounds on his toes that have opened up at this point. 05/31/2020 upon evaluation today patient appears to be doing well with regard to his  legs bilaterally where he really has no significant fluid buildup at this point overall he seems to be doing quite well. Very pleased in this regard. With regard to his toes these also seem to be drying up which is excellent. We have continue to wrap him as every time we tried as a transition to the juxta light wraps things just do not seem to get any better. 06/07/2020 upon evaluation today patient appears to be doing well with regard to his right leg at this point. Unfortunately left leg has a lot of blistering he tells me the wrap started to slide down on him when he tried to put his other Velcro wrap over top of it to help keep things in order but nonetheless still had some issues. 06/14/2020 on evaluation today patient appears to be doing well with regard to his lower extremity ulcers and foot ulcers at this point. I feel like everything is actually showing signs of improvement which is great news overall there is no signs of active infection at this time. No fevers, chills, nausea,  vomiting, or diarrhea. 06/21/2020 on evaluation today patient actually appears to be doing okay in regard to his wounds in general. With that being said the biggest issue I see is on his right foot in particular the first and second toe seem to be doing a little worse due to the fact this is staying very wet. I think he is probably getting need to change out his dressings a couple times in between each week when we see him in regard to his toes in order to keep this drier based on the location and how this is proceeding. 06/28/2020 on evaluation today patient appears to be doing a little bit more poorly overall in regard to the appearance of the skin I am actually somewhat concerned about the possibility of him having a little bit of an infection here. We discussed the course of potentially giving him a doxycycline prescription which he is taken previously with good result. With that being said I do believe that this is potentially mild and at this point easily fixed. I just do not want anything to get any worse. 07/12/2020 upon evaluation today patient actually appears to be making some progress with regard to his legs which is great news there does not appear to be any evidence of active infection. Overall very pleased with where things stand. 07/26/2020 upon evaluation today patient appears to be doing well with regard to his leg ulcers and toe ulcers at this point. He has been tolerating the compression wraps without complication overall very pleased in this regard. 08/09/2020 upon evaluation today patient appears to be doing well with regard to his lower extremities bilaterally. Fortunately there is no signs of active infection overall I am pleased with where things stand. 08/23/2020 on evaluation today patient appears to be doing well with regard to his wound. He has been tolerating the dressing changes without complication. Fortunately there is no signs of active infection at this time. Overall his  legs seem to be doing quite well which is great news and I am very pleased in that regard. No fevers, chills, nausea, vomiting, or diarrhea. 09/13/2020 upon evaluation today patient appears to be doing okay in regard to his lower extremities. He does have a fairly large blister on the right leg which I did remove the blister tissue from today so we can get this to dry out other than that however he seems to be doing  quite well. There is no signs of active infection at this time. 09/27/2020 upon evaluation today patient appears to actually be doing some better in regard to his right leg. Fortunately signs of active infection at this time which is great news. No fevers, chills, nausea, vomiting, or diarrhea. 10/04/2020 upon evaluation today patient actually appears to be showing signs of improvement which is great news with regard to his leg ulcers. Fortunately there is no signs of active infection which is great news he is still taking the antibiotics currently. No fevers, chills, nausea, vomiting, or diarrhea. 10/18/2020 on evaluation today patient appears to be doing well with regard to his legs currently. He has been tolerating the dressing changes including the wraps without complication. Fortunately there is no signs of active infection at this time. No fevers, chills, nausea, vomiting, or diarrhea. 10/25/2020 upon evaluation today patient appears to be doing decently well in regard to his wounds currently. He has been tolerating the dressing changes without complication. Overall I feel like he is making good progress albeit slow. Again this is something we can have to continue to wrap for some time to come most likely. 11/08/2020 upon evaluation today patient appears to be doing well with regard to his wounds currently. He has been tolerating the dressing changes without complication is not currently on any antibiotics and he does not appear to show any signs of infection. He does continue to have a  lot of drainage on the right leg not too severe but nonetheless this is very scattered. On the left leg this is looking to be much improved overall. 11/15/2020 upon evaluation today patient appears to be doing better with regard to his legs bilaterally. Especially the right leg which was much more significant last week. There does not appear to be any signs of active infection which is great news. No fevers, chills, nausea, vomiting, or diarrhea. 11/23/2019 upon evaluation today patient appears to be doing poorly still in regard to his lower extremities bilaterally. Unfortunately his right leg in particular appears to be doing much more poorly there is no signs really of infection this is not warm to touch but he does have a lot of drainage and weeping unfortunately. With that reason I do believe that we may need to initiate some treatment here to try to help calm down some of the swelling of the right leg. I think switching to a 4-layer compression wrap would be beneficial here. The patient is in agreement with giving this a try. 11/29/2020 upon evaluation today patient appears to be doing well currently in regard to his leg ulcers. I feel like the right leg is doing better he still has a lot of drainage but we do see some improvement here. The 4-layer compression wrap I think was helpful. 12/06/2020 upon evaluation today patient appears to be doing well with regard to his legs. In fact they seem to be doing about the best I have seen up to this point. Fortunately there is no signs of active infection at this time. No fevers, chills, nausea, vomiting, or diarrhea. 12/20/2020 upon evaluation today patient appears to be doing well at this time in regard to his legs. He is not having any significant draining which is great news. Fortunately there is no signs of active infection at this time. No fevers, chills, nausea, vomiting, or diarrhea. 01/17/2021 upon evaluation today Oree actually appears to be doing  excellent in regard to his legs. He has a few areas again that  come and go as far as his toes are concerned but overall this is doing quite well. 01/31/2021 upon evaluation today patient appears to be doing well with regard to his legs. Fortunately there does not appear to be any signs of active infection which is great news. Overall he is still having significant edema despite the compression wraps basically the 4-layer compression wrap to just keep things under control there is really not much room for play. 4/13: Mr. Mcinturff is a longstanding patient in our clinic and benefits greatly from weekly compression wraps. Today he has no complaints. He has been tolerating the wraps well. He states he is using the lymphedema pumps at home. 5/4; patient presents for follow-up of his chronic lymphedema/venous insufficiency ulcers. He comes weekly for compression wraps. He has no complaints today. He was unable to tolerate the Coflex 2 layer Last week so we will do the four press 4-layer compression. He has been using his lymphedema pumps daily. 5/18; patient presents for 2-week follow-up. He has no complaints or issues today. He has developed a new wound to the right foot on his fourth toe. He overall feels well and denies signs of infection. 6/1; patient presents for 2-week follow-up. He has no complaints or issues today. He denies signs of infection. 04/18/2021 upon evaluation today patient appears to be doing well with regard to his legs bilaterally. Family open wound is actually on the toe of his left foot everything else is completely closed which is great news. In general I am extremely pleased with where things stand at this point. The patient is also happy that things are doing so well. 05/02/2021 upon evaluation today patient's legs actually appear to be doing quite well today. Fortunately there does not appear to be any signs of active infection which is great and overall I am extremely pleased with  where he stands today. The patient does not appear to have any evidence of active infection at this time which is also great news. 05/09/2021 upon evaluation today patient appears to be doing a little bit more poorly in regard to his legs. Unfortunately he is having issues with some breakdown and a blood blister on the left leg this is due to I believe honestly to how it was wrapped last week. Fortunately there does not appear to be any signs of infection but nonetheless this is still a concern to be honest. No fevers, chills, nausea, vomiting, or diarrhea. 05/16/2021 upon evaluation today patient appears to be doing significantly better as compared to last week. I am very pleased with where things stand today. There does not appear to be any signs of infection which is great news and overall very pleased with where we stand. No fevers, chills, nausea, vomiting, or diarrhea. 05/30/2021 upon evaluation today patient appears to be doing well with regard to his legs. He has been tolerating the dressing changes without complication. Fortunately there does not appear to be any signs of active infection which is great news and overall I am extremely pleased with where things stand today. No fevers, chills, nausea, vomiting, or diarrhea. 06/20/2021 upon evaluation today patient actually appears to be making good progress today and very pleased with what we are seeing. I think his legs are really maintaining. As long as we continue wrapping he seems to be doing excellent in my opinion. Fortunately there is no signs of active infection at this time. No fevers, chills, nausea, vomiting, or diarrhea. Electronic Signature(s) Signed: 06/20/2021 12:11:55 PM By:  Linwood Dibbles, Linwood Gullikson PA-C Entered By: Lenda Kelp on 06/20/2021 12:11:55 -------------------------------------------------------------------------------- Physical Exam Details Patient Name: Date of Service: KELVYN, SCHUNK 06/20/2021 10:30 A M Medical Record  Number: [id number] Patient Account Number: [id number] Date of Birth/Sex: Treating RN: May 16, 1951 (70 y.o. M) Primary Care Provider: Nicoletta Ba Other Clinician: Referring Provider: Treating Provider/Extender: Adele Dan in Treatment: 282 Constitutional Obese and well-hydrated in no acute distress. Respiratory normal breathing without difficulty. Psychiatric this patient is able to make decisions and demonstrates good insight into disease process. Alert and Oriented x 3. pleasant and cooperative. Notes Upon inspection patient's wounds again showed signs of pretty much being closer really is not a second issue here. He does have a small area where he had an abrasion on his toe where his toenails were trimmed by podiatry and to be honest even this looks like it is almost healed and is doing well. Overall I am extremely pleased with everything I see I see no signs of infection. Electronic Signature(s) Signed: 06/20/2021 12:12:15 PM By: Lenda Kelp PA-C Entered By: Lenda Kelp on 06/20/2021 12:12:15 -------------------------------------------------------------------------------- Physician Orders Details Patient Name: Date of Service: [name], Anacleto J. 06/20/2021 10:30 A M Medical Record Number: [id number] Patient Account Number: [id number] Date of Birth/Sex: Treating RN: [birth year] (70 y.o. [name] Primary Care Provider: Nicoletta Ba Other Clinician: Referring Provider: Treating Provider/Extender: Adele Dan in Treatment: [phone] Verbal / Phone Orders: No Diagnosis Coding ICD-10 Coding Code Description L97.521 Non-pressure chronic ulcer of other 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 of skin L97.829 Non-pressure chronic ulcer of other part of left lower leg with unspecified severity L97.519 Non-pressure chronic ulcer of other part of right foot with unspecified severity I87.333 Chronic venous hypertension  (idiopathic) with ulcer and inflammation of bilateral lower extremity I89.0 Lymphedema, not elsewhere classified E11.622 Type 2 diabetes mellitus with other skin ulcer E11.40 Type 2 diabetes mellitus with diabetic neuropathy, unspecified Follow-up Appointments Return appointment in 3 weeks. - with Leonard Schwartz 07/11/2021 Nurse Visit: - 06/27/2021 and 07/04/2021 Bathing/ Shower/ Hygiene May shower with protection but do not get wound dressing(s) wet. Edema Control - Lymphedema / SCD / Other Bilateral Lower Extremities Lymphedema Pumps. Use Lymphedema pumps on leg(s) 2-3 times a day for 45-60 minutes. If wearing any wraps or hose, do not remove them. Continue exercising as instructed. Elevate legs to the level of the heart or above for 30 minutes daily and/or when sitting, a frequency of: - throughout the day Avoid standing for long periods of time. Exercise regularly Non Wound Condition Bilateral Lower Extremities Other Non Wound Condition Orders/Instructions: - lotion to both legs, 4 layer compression wraps both legs, silver alginate to any weeping areas Wound Treatment Wound #193 - T Second oe Wound Laterality: Left Cleanser: Soap and Water 1 x Per Week/15 Days Discharge Instructions: May shower and wash wound with dial antibacterial soap and water prior to dressing change. Cleanser: Wound Cleanser 1 x Per Week/15 Days Discharge Instructions: Cleanse the wound with wound cleanser prior to applying a clean dressing using gauze sponges, not tissue or cotton balls. Prim Dressing: KerraCel Ag Gelling Fiber Dressing, 2x2 in (silver alginate) 1 x Per Week/15 Days ary Discharge Instructions: Apply silver alginate to wound bed as instructed Secondary Dressing: Woven Gauze Sponges 2x2 in 1 x Per Week/15 Days Discharge Instructions: Apply over primary dressing as directed. Secured With: Insurance underwriter, Sterile 2x75 (in/in) 1 x Per Week/15 Days Discharge  Instructions: Secure with stretch  gauze as directed. Electronic Signature(s) Signed: 06/20/2021 4:56:53 PM By: Lenda Kelp PA-C Signed: 06/20/2021 5:25:21 PM By: Shawn Stall Entered By: Shawn Stall on 06/20/2021 11:13:34 -------------------------------------------------------------------------------- Problem List Details Patient Name: Date of Service: [facility], [place] J. 06/20/2021 10:30 A M Medical Record Number: [id number] Patient Account Number: [id number] Date of Birth/Sex: Treating RN: 01/24/1951 (70 y.o. M) Primary Care Provider: Nicoletta Ba Other Clinician: Referring Provider: Treating Provider/Extender: Adele Dan in Treatment: 282 Active Problems ICD-10 Encounter Code Description Active Date MDM Diagnosis L97.521 Non-pressure chronic ulcer of other 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 of 04/27/2018 No Yes skin L97.829 Non-pressure chronic ulcer of other part of left lower leg with unspecified 03/21/2021 No Yes severity L97.519 Non-pressure chronic ulcer of other part of right foot with unspecified severity 03/21/2021 No Yes I87.333 Chronic venous hypertension (idiopathic) with ulcer and inflammation of 01/22/2016 No Yes bilateral lower extremity I89.0 Lymphedema, not elsewhere classified 01/22/2016 No Yes E11.622 Type 2 diabetes mellitus with other skin ulcer 01/22/2016 No Yes E11.40 Type 2 diabetes mellitus with diabetic neuropathy, unspecified 01/22/2016 No Yes Inactive Problems ICD-10 Code Description Active Date Inactive Date L03.115 Cellulitis of right lower limb 12/22/2017 12/22/2017 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 06/30/2018 06/30/2018 L97.511 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 of skin 06/30/2018 06/30/2018 Resolved Problems ICD-10 Code Description Active Date Resolved Date L97.211 Non-pressure chronic ulcer of right calf limited to breakdown of skin 06/30/2018 06/30/2018 L97.221  Non-pressure chronic ulcer of left calf limited to breakdown of skin 09/30/2016 09/30/2016 L03.116 Cellulitis of left lower limb 04/01/2017 04/01/2017 L97.211 Non-pressure chronic ulcer of right calf limited to breakdown of skin 06/30/2017 06/30/2017 Electronic Signature(s) Signed: 06/20/2021 10:54:42 AM By: Lenda Kelp PA-C Entered By: Lenda Kelp on 06/20/2021 10:54:41 -------------------------------------------------------------------------------- Progress Note Details Patient Name: Date of Service: [name], Julez J. 06/20/2021 10:30 A M Medical Record Number: [id number] Patient Account Number: [id number] Date of Birth/Sex: Treating RN: 14-Sep-1951 (70 y.o. M) Primary Care Provider: Nicoletta Ba Other Clinician: Referring Provider: Treating Provider/Extender: Adele Dan in Treatment: 282 Subjective Chief Complaint Information obtained from Patient patient is here for evaluation of venous/lymphedema ulcers History of Present Illness (HPI) Referred by PCP for consultation. Patient has long standing history of BLE venous stasis, no prior ulcerations. At beginning of month, developed cellulitis and weeping. Received IM Rocephin followed by Keflex and resolved. Wears compression stocking, appr 6 months old. Not sure strength. No present drainage. 01/22/16 this is a patient who is a type II diabetic on insulin. 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 He tells me he religiously wears pressure stockings of uncertain strength. He was here with weeping edema about 8 months ago but did not have an open wound. Roughly a month ago he had a reopening on his bilateral legs. He is been using bandages and Neosporin. He does not complain of pain. 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 Last BUN/creatinine I have is from 11/20/15 at  13 and 1.0 respectively 01/29/16; patient arrives today having tolerated the Profore wrap. He brought in his stockings and these are 18 mmHg stockings he bought from [employer]. The compression here is likely inadequate. He does not complain of pain or excessive drainage she has no  systemic symptoms. The wound on the right looks improved as does the one on the left although one on the left is more substantial with still tissue at risk below the actual wound area on the bilateral posterior calf 02/05/16; patient arrives with poor edema control. He states that we did put a 4 layer compression on it last week. No weight appear 5 this. 02/12/16; the area on the posterior right Has healed. The left Has a substantial wound that has necrotic surface eschar that requires a debridement with a curette. 02/16/16;the patient called or a Nurse visit secondary to increased swelling. He had been in earlier in the week with his right leg healed. He was transitioned to is on pressure stocking on the right leg with the only open wound on the left, a substantial area on the left posterior calf. Note he has a history of severe lower extremity edema, he has a history of chronic atrial fibrillation but not heart failure per my notes but I'll need to research this. He is not complaining of chest pain shortness of breath or orthopnea. The intake nurse noted blisters on the previously closed right leg 02/19/16; this is the patient's regular visit day. I see him on Friday with escalating edema new wounds on the right leg and clear signs of at least right ventricular heart failure. I increased his Lasix to 40 twice a day. He is returning currently in follow-up. States he is noticed a decrease in that the edema 02/26/16 patient's legs have much less edema. There is nothing really open on the right leg. The left leg has improved condition of the large superficial wound on the posterior left leg 03/04/16; edema control is very much better.  The patient's right leg wounds have healed. On the left leg he continues to have severe venous inflammation on the posterior aspect of the left leg. There is no tenderness and I don't think any of this is cellulitis. 03/11/16; patient's right leg is married healed and he is in his own stocking. The patient's left leg has deteriorated somewhat. There is a lot of erythema around the wound on the posterior left leg. There is also a significant rim of erythema posteriorly just above where the wrap would've ended there is a new wound in this location and a lot of tenderness. Can't rule out cellulitis in this area. 03/15/16; patient's right leg remains healed and he is in his own stocking. The patient's left leg is much better than last review. His major wound on the posterior aspect of his left Is almost fully epithelialized. He has 3 small injuries from the wraps. Really. Erythema seems a lot better on antibiotics 03/18/16; right leg remains healed and he is in his own stocking. The patient's left leg is much better. The area on the posterior aspect of the left calf is fully epithelialized. His 3 small injuries which were wrap injuries on the left are improved only one seems still open his erythema has resolved 03/25/16; patient's right leg remains healed and he is in his own stocking. There is no open area today on the left leg posterior leg is completely closed up. His wrap injuries at the superior aspect of his leg are also resolved. He looks as though he has some irritation on the dorsal ankle but this is fully epithelialized without evidence of infection. 03/28/16; we discharged this patient on Monday. Transitioned him into his own stocking. There were problems almost immediately with uncontrolled swelling weeping edema multiple some  of which have opened. He does not feel systemically unwell in particular no chest pain no shortness of breath and he does not feel 04/08/16; the edema is under better control  with the Profore light wrap but he still has pitting edema. There is one large wound anteriorly 2 on the medial aspect of his left leg and 3 small areas on the superior posterior calf. Drainage is not excessive he is tolerating a Profore light well 04/15/16; put a Profore wrap on him last week. This is controlled is edema however he had a lot of pain on his left anterior foot most of his wounds are healed 04/22/16 once again the patient has denuded areas on the left anterior foot which he states are because his wrap slips up word. He saw his primary physician today is on Lasix 40 twice a day and states that he his weight is down 20 pounds over the last 3 months. 04/29/16: Much improved. left anterior foot much improved. He is now on Lasix 80 mg per day. Much improved edema control 05/06/16; I was hoping to be able to discharge him today however once again he has blisters at a low level of where the compression was placed last week mostly on his left lateral but also his left medial leg and a small area on the anterior part of the left foot. 05/09/16; apparently the patient went home after his appointment on 7/4 later in the evening developing pain in his upper medial thigh together with subjective fever and chills although his temperature was not taken. The pain was so intense he felt he would probably have to call 911. However he then remembered that he had leftover doxycycline from a previous round of antibiotics and took these. By the next morning he felt a lot better. He called and spoke to one of our nurses and I approved doxycycline over the phone thinking that this was in relation to the wounds we had previously seen although they were definitely were not. The patient feels a lot better old fever no chills he is still working. Blood sugars are reasonably controlled 05/13/16; patient is back in for review of his cellulitis on his anterior medial upper thigh. He is taking doxycycline this is a lot better.  Culture I did of the nodular area on the dorsal aspect of his foot grew MRSA this also looks a lot better. 05/20/16; the patient is cellulitis on the medial upper thigh has resolved. All of his wound areas including the left anterior foot, areas on the medial aspect of the left calf and the lateral aspect of the calf at all resolved. He has a new blister on the left dorsal foot at the level of the fourth toe this was excised. No evidence of infection 05/27/16; patient continues to complain weeping edema. He has new blisterlike wounds on the left anterior lateral and posterior lateral calf at the top of his wrap levels. The area on his left anterior foot appears better. He is not complaining of fever, pain or pruritus in his feet. 05/30/16; the patient's blisters on his left anterior leg posterior calf all look improved. He did not increase the Lasix 100 mg as I suggested because he was going to run out of his 40 mg tablets. He is still having weeping edema of his toes 06/03/16; I renewed his Lasix at 80 mg once a day as he was about to run out when I last saw him. He is on 80 mg of  Lasix now. I have asked him to cut down on the excessive amount of water he was drinking and asked him to drink according to his thirst mechanisms 06/12/2016 -- was seen 2 days ago and was supposed to wear his compression stockings at home but he is developed lymphedema and superficial blisters on the left lower extremity and hence came in for a review 06/24/16; the remaining wound is on his left anterior leg. He still has edema coming from between his toes. There is lymphedema here however his edema is generally better than when I last saw this. He has a history of atrial fibrillation but does not have a known history of congestive heart failure nevertheless I think he probably has this at least on a diastolic basis. 07/01/16 I reviewed his echocardiogram from January 2017. This was essentially normal. He did not have LVH, EF  of 55-60%. His right ventricular function was normal although he did have trivial tricuspid and pulmonic regurgitation. This is not audible on exam however. I increased his Lasix to do massive edema in his legs well above his knees I think in early July. He was also drinking an excessive amount of water at the time. 07/15/16; missed his appointment last week because of the Labor Day holiday on Monday. He could not get another appointment later in the week. Started to feel the wrap digging in superiorly so we remove the top half and the bottom half of his wrap. He has extensive erythema and blistering superiorly in the left leg. Very tender. Very swollen. Edema in his foot with leaking edema fluid. He has not been systemically unwell 07/22/16; the area on the left leg laterally required some debridement. The medial wounds look more stable. His wrap injury wounds appear to have healed. Edema and his foot is better, weeping edema is also better. He tells me he is meeting with the supplier of the external compression pumps at work 08/05/16; the patient was on vacation last week in [facility]. His wrap is been on for an extended period of time. Also over the weekend he developed an extensive area of tender erythema across his anterior medial thigh. He took to doxycycline yesterday that he had leftover from a previous prescription. The patient complains of weeping edema coming out of his toes 08/08/16; I saw this patient on 10/2. He was tender across his anterior thigh. I put him on doxycycline. He returns today in follow-up. He does not have any open wounds on his lower leg, he still has edema weeping into his toes. 08/12/16; patient was seen back urgently today to follow-up for his extensive left thigh cellulitis/erysipelas. He comes back with a lot less swelling and erythema pain is much better. I believe I gave him Augmentin and Cipro. His wrap was cut down as he stated a roll down his legs. He developed  blistering above the level of the wrap that remained. He has 2 open blisters and 1 intact. 08/19/16; patient is been doing his primary doctor who is increased his Lasix from 40-80 once a day or 80 already has less edema. Cellulitis has remained improved in the left thigh. 2 open areas on the posterior left calf 08/26/16; he returns today having new open blisters on the anterior part of his left leg. He has his compression pumps but is not yet been shown how to use some vital representative from the supplier. 09/02/16 patient returns today with no open wounds on the left leg. Some maceration in his  plantar toes 09/10/2016 -- Dr. Leanord Hawking had recently discharged him on 09/02/2016 and he has come right back with redness swelling and some open ulcers on his left lower extremity. He says this was caused by trying to apply his compression stockings and he's been unable to use this and has not been able to use his lymphedema pumps. He had some doxycycline leftover and he has started on this a few days ago. 09/16/16; there are no open wounds on his leg on the left and no evidence of cellulitis. 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 He does not complain of symptoms here. I am not clear use using his external compression pumps. 09/23/16; I have not seen this patient in 2 weeks. He canceled his appointment 10 days ago as he was going on vacation. He tells me that on Monday he noticed a large area on his posterior left leg which is been draining copiously and is reopened into a large wound. He is been using ABDs and the external part of his juxtalite, according to our nurse this was not on properly. 10/07/16; Still a substantial area on the posterior left leg. Using silver alginate 10/14/16; in general better although there is still open area which looks healthy. Still using silver alginate. He reminds me that this happen before he left for [facility]. T oday while he was showering in the morning. He had been using his juxtalite's 10/21/16; the area on his posterior left leg is fully epithelialized. However he arrives today with a large area of tender erythema in his medial and posterior left thigh just above the knee. I have marked the area. Once again he is reluctant to consider hospitalization. I treated him with oral antibiotics in the past for a similar situation with resolution I think with doxycycline however this area it seems more extensive to me. He is not complaining of fever but does have chills and says states he is thirsty. His blood sugar today was in the 140s at home 10/25/16 the area on his posterior left leg is fully epithelialized although there is still some weeping edema. The large area of tenderness and erythema in his medial and posterior left thigh is a lot less tender although there is still a lot of swelling in this thigh. He states he feels a lot better. He is on doxycycline and Augmentin that I started last week. This will continued until Tuesday, December 26. I have ordered a duplex ultrasound of the left thigh rule out DVT whether there is an abscess something that would need to be drained I would also like to know. 11/01/16; he still has weeping edema from a not fully epithelialized area on his left posterior calf. Most of the rest of this looks a lot better. He has completed his antibiotics. His thigh is a lot better. Duplex ultrasound did not show a DVT in the thigh 11/08/16; he comes in today with more Denuded surface epithelium from the posterior aspect of his calf. There is no real evidence of cellulitis. The superior aspect of his wrap appears to have put quite an indentation in his leg just below the knee and this may have contributed. He does not complain of pain or fever. We have been using silver alginate as the primary dressing. The area of cellulitis in the right thigh has totally resolved. He has been  using his compression stockings once a week 11/15/16; the patient arrives today  with more loss of epithelium from the posterior aspect of his left calf. He now has a fairly substantial wound in this area. The reason behind this deterioration isn't exactly clear although his edema is not well controlled. He states he feels he is generally more swollen systemically. He is not complaining of chest pain shortness of breath fever. T me he has an appointment with his primary physician in early February. He is on 80 mg of oral ells Lasix a day. He claims compliance with the external compression pumps. He is not having any pain in his legs similar to what he has with his recurrent cellulitis 11/22/16; the patient arrives a follow-up of his large area on his left lateral calf. This looks somewhat better today. He came in earlier in the week for a dressing change since I saw him a week ago. He is not complaining of any pain no shortness of breath no chest pain 11/28/16; the patient arrives for follow-up of his large area on the left lateral calf this does not look better. In fact it is larger weeping edema. The surface of the wound does not look too bad. We have been using silver alginate although I'm not certain that this is a dressing issue. 12/05/16; again the patient follows up for a large wound on the left lateral and left posterior calf this does not look better. There continues to be weeping edema necrotic surface tissue. More worrisome than this once again there is erythema below the wound involving the distal Achilles and heel suggestive of cellulitis. He is on his feet working most of the day of this is not going well. We are changing his dressing twice a week to facilitate the drainage. 12/12/16; not much change in the overall dimensions of the large area on the left posterior calf. This is very inflamed looking. I gave him an. Doxycycline last week does not really seem to have helped. He found the wrap  very painful indeed it seems to of dog into his legs superiorly and perhaps around the heel. He came in early today because the drainage had soaked through his dressings. 12/19/16- patient arrives for follow-up evaluation of his left lower extremity ulcers. He states that he is using his lymphedema pumps once daily when there is "no drainage". He admits to not using his lipedema pumps while under current treatment. His blood sugars have been consistently between 150-200. 12/26/16; the patient is not using his compression pumps at home because of the wetness on his feet. I've advised him that I think it's important for him to use this daily. He finds his feet too wet, he can put a plastic bag over his legs while he is in the pumps. Otherwise I think will be in a vicious circle. We are using silver alginate to the major area on his left posterior calf 01/02/17; the patient's posterior left leg has further of all into 3 open wounds. All of them covered with a necrotic surface. He claims to be using his compression pumps once a day. His edema control is marginal. Continue with silver alginate 01/10/17; the patient's left posterior leg actually looks somewhat better. There is less edema, less erythema. Still has 3 open areas covered with a necrotic surface requiring debridement. He claims to be using his compression pumps once a day his edema control is better 01/17/17; the patient's left posterior calf look better last week when I saw him and his wrap was changed 2 days ago. He has  noted increasing pain in the left heel and arrives today with much larger wounds extensive erythema extending down into the entire heel area especially tender medially. He is not systemically unwell CBGs have been controlled no fever. Our intake nurse showed me limegreen drainage on his AVD pads. 01/24/17; his usual this patient responds nicely to antibiotics last week giving him Levaquin for presumed Pseudomonas. The whole entire  posterior part of his leg is much better much less inflamed and in the case of his Achilles heel area much less tender. He has also had some epithelialization posteriorly there are still open areas here and still draining but overall considerably better 01/31/17- He has continue to tolerate the compression wraps. he states that he continues to use the lymphedema pumps daily, and can increase to twice daily on the weekends. He is voicing no complaints or concerns regarding his LLE ulcers 02/07/17-he is here for follow-up evaluation. He states that he noted some erythema to the left medial and anterior thigh, which he states is new as of yesterday. He is concerned about recurrent cellulitis. He states his blood sugars have been slightly elevated, this morning in the 180s 02/14/17; he is here for follow-up evaluation. When he was last here there was erythema superiorly from his posterior wound in his anterior thigh. He was prescribed Levaquin however a culture of the wound surface grew MRSA over the phone I changed him to doxycycline on Monday and things seem to be a lot better. 02/24/17; patient missed his appointment on Friday therefore we changed his nurse visit into a physician visit today. Still using silver alginate on the large area of the posterior left thigh. He isn't new area on the dorsal left second toe 03/03/17; actually better today although he admits he has not used his external compression pumps in the last 2 days or so because of work responsibilities over the weekend. 03/10/17; continued improvement. External compression pumps once a day almost all of his wounds have closed on the posterior left calf. Better edema control 03/17/17; in general improved. He still has 3 small open areas on the lateral aspect of his left leg however most of the area on the posterior part of his leg is epithelialized. He has better edema control. He has an ABD pad under his stocking on the right anterior lower leg  although he did not let us look at that today. 03/24/17; patient arrives back in clinic today with no open areas however there are areas on the posterior left calf and anterior left calf that are less than 100% epithelialized. His edema is well controlled in the left lower leg. There is some pitting edema probably lymphedema in the left upper thigh. He uses compression pumps at home once per day. I tried to get him to do this twice a day although he is very reticent. 04/01/2017 -- for the last 2 days he's had significant redness, tenderness and weeping and came in for an urgent visit today. 04/07/17; patient still has 6 more days of doxycycline. He was seen by Dr. Meyer Russel last Wednesday for cellulitis involving the posterior aspect, lateral aspect of his Involving his heel. For the most part he is better there is less erythema and less weeping. He has been on his feet for 12 hours o2 over the weekend. Using his compression pumps once a day 04/14/17 arrives today with continued improvement. Only one area on the posterior left calf that is not fully epithelialized. He has intense bilateral venous inflammation  associated with his chronic venous insufficiency disease and secondary lymphedema. We have been using silver alginate to the left posterior calf wound In passing he tells [place] today that the right leg but we have not seen in quite some time has an open area on it but he doesn't want [place] to look at this today states he will show this to [place] next week. 04/21/17; there is no open area on his left leg although he still reports some weeping edema. He showed [place] his right leg today which is the first time we've seen this leg in a long time. He has a large area of open wound on the right leg anteriorly healthy granulation. Quite a bit of swelling in the right leg and some degree of venous inflammation. He told us about the right leg in passing last week but states that deterioration in the right leg really only  happened over the weekend 04/28/17; there is no open area on the left leg although there is an irritated part on the posterior which is like a wrap injury. The wound on the right leg which was new from last week at least to [place] is a lot better. 05/05/17; still no open area on the left leg. Patient is using his new compression stocking which seems to be doing a good job of controlling the edema. He states he is using his compression pumps once per day. The right leg still has an open wound although it is better in terms of surface area. Required debridement. A lot of pain in the posterior right Achilles marked tenderness. Usually this type of presentation this patient gives concern for an active cellulitis 05/12/17; patient arrives today with his major wound from last week on the right lateral leg somewhat better. Still requiring debridement. He was using his compression stocking on the left leg however that is reopened with superficial wounds anteriorly he did not have an open wound on this leg previously. He is still using his juxta light's once daily at night. He cannot find the time to do this in the morning as he has to be at work by 7 AM 05/19/17; right lateral leg wound looks improved. No debridement required. The concerning area is on the left posterior leg which appears to almost have a subcutaneous hemorrhagic component to it. We've been using silver alginate to all the wounds 05/26/17; the right lateral leg wound continues to look improved. However the area on the left posterior calf is a tightly adherent surface. [place] using silver alginate. Because of the weeping edema in his legs there is very little good alternatives. 06/02/17; the patient left here last week looking quite good. Major wound on the left posterior calf and a small one on the right lateral calf. Both of these look satisfactory. He tells me that by Wednesday he had noted increased pain in the left leg and drainage. He called on  Thursday and Friday to get an appointment here but we were blocked. He did not go to urgent care or his primary physician. He thinks he had a fever on Thursday but did not actually take his temperature. He has not been using his compression pumps on the left leg because of pain. I advised him to go to the emergency room today for IV antibiotics for stents of left leg cellulitis but he has refused I have asked him to take 2 days off work to keep his leg elevated and he has refused this as well. In view  of this I'm going to call him and Augmentin and doxycycline. 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 However after a detailed discussion regarding the risk benefits and alternatives he refuses to get admitted to the hospital. With no other recourse I will continue him on oral antibiotics as before and hopefully he'll have his infectious disease consultation this week. 06/16/2017 -- the patient was seen today by the nurse practitioner at infectious disease Ms. Dixon. 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 He has also been advise Lotrimin cream locally. she also had wise IV antibiotics for his condition if it flares up 06/23/17; patient arrives today with drainage bilaterally although the remaining wound on the left posterior calf after cleaning up today "highlighter yellow drainage" did not look too bad. Unfortunately he has had breakdown on the right anterior leg [previously this leg had not been open and he is using a black stocking] he went to see infectious disease and is been  put on clindamycin 150 mg daily, I did not verify the dose although I'm not familiar with using clindamycin in this dosing range, perhaps for prophylaxisoo 06/27/17; I brought this patient back today to follow-up on the wound deterioration on the right lower leg together with surrounding cellulitis. I started him on doxycycline 4 days ago. This area looks better however he comes in today with intense cellulitis on the medial part of his left thigh. This is not have a wound in this area. Extremely tender. We've been using silver alginate to the wounds on the right lower leg left lower leg with bilateral 4 layer compression he is using his external compression pumps once a day 07/04/17; patient's left medial thigh cellulitis looks better. He has not been using his compression pumps as his insert said it was contraindicated with cellulitis. His right leg continues to make improvements all the wounds are still open. We only have one remaining wound on the left posterior calf. Using silver alginate to all open areas. He is on doxycycline which I started a week ago and should be finishing I gave him Augmentin after Thursday's visit for the severe cellulitis on the left medial thigh which fortunately looks better 07/14/17; the patient's left medial thigh cellulitis has resolved. The cellulitis in his right lower calf on the right also looks better. All of his wounds are stable to improved we've been using silver alginate he has completed the antibiotics I have given him. He has clindamycin 150 mg once a day prescribed by infectious disease for prophylaxis, I've advised him to start this now. We have been using bilateral Unna boots over silver alginate to the wound areas 07/21/17; the patient is been to see infectious disease who noted his recurrent problems with cellulitis. He was not able to tolerate prophylactic clindamycin therefore he is on amoxicillin 500 twice a day. He also had a second daily dose of Lasix  added By Dr. Oneta Rack but he is not taking this. Nor is he being completely compliant with his compression pumps a especially not this week. He has 2  remaining wounds one on the right posterior lateral lower leg and one on the left posterior medial lower leg. 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 The patient has Unna boots bilaterally. 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 He has bilateral Unna boots for compression and silver alginate to his wounds. Arrives today with his legs looking as good as I have seen him in quite some time. Not surprisingly his wounds look better as well with improvement on the right lateral leg venous insufficiency wound and also the left medial leg. He is still using the compression pumps once a day 08/11/17; both legs appear to be doing better wounds on the right lateral and left medial legs look better. Skin on the right leg quite good. He is been using silver alginate as the primary dressing. I'm going to use Anasept gel calcium alginate and maintain all the secondary dressings 08/18/17; the patient continues to actually do quite well. The area on his right lateral leg is just about closed the left medial also looks better although it is still moist in this area. His edema is well controlled we have been using Anasept gel with calcium alginate and the usual secondary dressings, 4 layer compression and once daily use of his compression pumps "always been able to manage 09/01/17; the patient continues to do reasonably well in spite of his trip to [place]. The area on the right lateral leg is epithelialized. Left is much better but still open. He has more edema and more chronic erythema on the left leg [venous inflammation] 09/08/17; he  arrives today with no open wound on the right lateral leg and decently controlled edema. Unfortunately his left leg is not nearly as in his good situation as last week.he apparently had increasing edema starting on Saturday. He edema soaked through into his foot so used a plastic bag to walk around his home. The area on the medial right leg which was his open area is about the same however he has lost surface epithelium on the left lateral which is new and he has significant pain in the Achilles area of the left foot. He is already on amoxicillin chronically for prophylaxis of cellulitis in the left leg 09/15/17; he is completed a week of doxycycline and the cellulitis in the left posterior leg and Achilles area is as usual improved. He still has a lot of edema and fluid soaking through his dressings. There is no open wound on the right leg. He saw infectious disease NP today 09/22/17;As usual 1 we transition him from our compression wraps to his stockings things did not go well. He has several small open areas on the right leg. He states this was caused by the compression wrap on his skin although he did not wear this with the stockings over them. He has several superficial areas on the left leg medially laterally posteriorly. He does not have any evidence of active cellulitis especially involving the left Achilles The patient is traveling from [facility] Saturday going to [facility]. He states he isn't attempting to get an appointment with a heel objects wound center there to change his dressings. I am not completely certain whether this will work 10/06/17; the patient came in on Friday for a nurse visit and the nurse  reported that his legs actually look quite good. He arrives in clinic today for his regular follow-up visit. He has a new wound on his left third toe over the PIP probably caused by friction with his footwear. He has small areas on the left leg and a very superficial but epithelialized  area on the right anterior lateral lower leg. Other than that his legs look as good as I've seen him in quite some time. We have been using silver alginate Review of systems; no chest pain no shortness of breath other than this a 10 point review of systems negative 10/20/17; seen by Dr. Meyer Russel last week. He had taken some antibiotics [doxycycline] that he had left over. Dr. Meyer Russel thought he had candida infection and declined to give him further antibiotics. He has a small wound remaining on the right lateral leg several areas on the left leg including a larger area on the left posterior several left medial and anterior and a small wound on the left lateral. The area on the left dorsal third toe looks a lot better. ROS; Gen.; no fever, respiratory no cough no sputum Cardiac no chest pain other than this 10 point review of system is negative 10/30/17; patient arrives today having fallen in the bathtub 3 days ago. It took him a while to get up. He has pain and maceration in the wounds on his left leg which have deteriorated. He has not been using his pumps he also has some maceration on the right lateral leg. 11/03/17; patient continues to have weeping edema especially in the left leg. This saturates his dressings which were just put on on 12/27. As usual the doxycycline seems to take care of the cellulitis on his lower leg. He is not complaining of fever, chills, or other systemic symptoms. He states his leg feels a lot better on the doxycycline I gave him empirically. He also apparently gets injections at his primary doctor's officeo Rocephin for cellulitis prophylaxis. I didn't ask him about his compression pump compliance today I think that's probably marginal. Arrives in the clinic with all of his dressings primary and secondary macerated full of fluid and he has bilateral edema 11/10/17; the patient's right leg looks some better although there is still a cluster of wounds on the right lateral. The  left leg is inflamed with almost circumferential skin loss medially to laterally although we are still maintaining anteriorly. He does not have overt cellulitis there is a lot of drainage. He is not using compression pumps. We have been using silver alginate to the wound areas, there are not a lot of options here 11/17/17; the patient's right leg continues to be stable although there is still open wounds, better than last week. The inflammation in the left leg is better. Still loss of surface layer epithelium especially posteriorly. There is no overt cellulitis in the amount of edema and his left leg is really quite good, tells me he is using his compression pumps once a day. 11/24/17; patient's right leg has a small superficial wound laterally this continues to improve. The inflammation in the left leg is still improving however we have continuous surface layer epithelial loss posteriorly. There is no overt cellulitis in the amount of edema in both legs is really quite good. He states he is using his compression pumps on the left leg once a day for 5 out of 7 days 12/01/17; very small superficial areas on the right lateral leg continue to improve. Edema control in  both legs is better today. He has continued loss of surface epithelialization and left posterior calf although I think this is better. We have been using silver alginate with large number of absorptive secondary dressings 4 layer on the left Unna boot on the right at his request. He tells me he is using his compression pumps once a day 12/08/17; he has no open area on the right leg is edema control is good here. ooOn the left leg however he has marked erythema and tenderness breakdown of skin. He has what appears to be a wrap injury just distal to the popliteal fossa. This is the pattern of his recurrent cellulitis area and he apparently received penicillin at his primary physician's office really worked in my view but usually response to  doxycycline given it to him several times in the past 12/15/17; the patient had already deteriorated last Friday when he came in for his nurse check. There was swelling erythema and breakdown in the right leg. He has much worse skin breakdown in the left leg as well multiple open areas medially and posteriorly as well as laterally. He tells me he has been using his compression pumps but tells me he feels that the drainage out of his leg is worse when he uses a compression pumps. T be fair to him he is been saying this o for a while however I don't know that I have really been listening to this. I wonder if the compression pumps are working properly 12/22/17;. Once again he arrives with severe erythema, weeping edema from the left greater than right leg. Noncompliance with compression pumps. New this visit he is complaining of pain on the lateral aspect of the right leg and the medial aspect of his right thigh. He apparently saw his cardiologist Dr. [name] who was ordered an echocardiogram area and I think this is a step in the right direction 12/25/17; started his doxycycline Monday night. There is still intense erythema of the right leg especially in the anterior thigh although there is less tenderness. The erythema around the wound on the right lateral calf also is less tender. He still complaining of pain in the left heel. His wounds are about the same right lateral left medial left lateral. Superficial but certainly not close to closure. He denies being systemically unwell no fever chills no abdominal pain no diarrhea 12/29/17; back in follow-up of his extensive right calf and right thigh cellulitis. I added amoxicillin to cover possible doxycycline resistant strep. This seems to of done the trick he is in much less pain there is much less erythema and swelling. He has his echocardiogram at 11:00 this morning. X-ray of the left heel was also negative. 01/05/18; the patient arrived with his edema under  much better control. Now that he is retired he is able to use his compression pumps daily and sometimes twice a day per the patient. He has a wound on the right leg the lateral wound looks better. Area on the left leg also looks a lot better. He has no evidence of cellulitis in his bilateral thighs I had a quick peak at his echocardiogram. He is in normal ejection fraction and normal left ventricular function. He has moderate pulmonary hypertension moderately reduced right ventricular function. One would have to wonder about chronic sleep apnea although he says he doesn't snore. He'll review the echocardiogram with his cardiologist. 01/12/18; the patient arrives with the edema in both legs under exemplary control. He is using his compression  pumps daily and sometimes twice daily. His wound on the right lateral leg is just about closed. He still has some weeping areas on the posterior left calf and lateral left calf although everything is just about closed here as well. I have spoken with Aldean Baker who is the patient's nurse practitioner and infectious disease. She was concerned that the patient had not understood that the parenteral penicillin injections he was receiving for cellulitis prophylaxis was actually benefiting him. I don't think the patient actually saw that I would tend to agree we were certainly dealing with less infections although he had a serious one last month. 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 He is healed. He'll be placed in juxtalite compression wraps and increase his lymphedema pumps to twice daily. We will follow up again next week to ensure there are no issues with the new regiment. 01/20/18-he is here for evaluation of bilateral lower extremity weeping edema. Yesterday he was placed in compression wrap to the right lower extremity and compression stocking to left lower shrubbery. He states he uses lymphedema pumps last night and  again this morning and noted a blister to the left lower extremity. On exam he was noted to have drainage to the right lower extremity. He will be placed in Unna boots bilaterally and follow-up next week 01/26/18; patient was actually discharged a week ago to his own juxta light stockings only to return the next day with bilateral lower extremity weeping edema.he was placed in bilateral Unna boots. He arrives today with pain in the back of his left leg. There is no open area on the right leg however there is a linear/wrap injury on the left leg and weeping edema on the left leg posteriorly. I spoke with infectious disease about 10 days ago. They were disappointed that the patient elected to discontinue prophylactic intramuscular penicillin shots as they felt it was particularly beneficial in reducing the frequency of his cellulitis. I discussed this with the patient today. He does not share this view. He'll definitely need antibiotics today. Finally he is traveling to [place] and trauma leaving this Saturday and returning a week later and he does not travel with his pumps. He is going by car 01/30/18; patient was seen 4 days ago and brought back in today for review of cellulitis in the left leg posteriorly. I put him on amoxicillin this really hasn't helped as much as I might like. He is also worried because he is traveling to [facility] trauma by car. Finally we will be rewrapping him. There is no open area on the right leg over his left leg has multiple weeping areas as usual 02/09/18; The same wrap on for 10 days. He did not pick up the last doxycycline I prescribed for him. He apparently took 4 days worth he already had. There is nothing open on his right leg and the edema control is really quite good. He's had damage in the left leg medially and laterally especially probably related to the prolonged use of Unna boots 02/12/18; the patient arrived in clinic today for a nurse visit/wrap change. He  complained of a lot of pain in the left posterior calf. He is taking doxycycline that I previously prescribed for him. Unfortunately even though he used his stockings and apparently used to compression pumps twice a day he has weeping edema coming out of the lateral part of his right leg. This is coming from the lower anterior lateral skin area.  02/16/18; the patient has finished his doxycycline and will finish the amoxicillin 2 days. The area of cellulitis in the left calf posteriorly has resolved. He is no longer having any pain. He tells me he is using his compression pumps at least once a day sometimes twice. 02/23/18; the patient finished his doxycycline and Amoxil last week. On Friday he noticed a small erythematous circle about the size of a quarter on the left lower leg just above his ankle. This rapidly expanded and he now has erythema on the lateral and posterior part of the thigh. This is bright red. Also has an area on the dorsal foot just above his toes and a tender area just below the left popliteal fossa. He came off his prophylactic penicillin injections at his own insistence one or 2 months ago. This is obviously deteriorated since then 03/02/18; patient is on doxycycline and Amoxil. Culture I did last week of the weeping area on the back of his left calf grew group B strep. I have therefore renewed the amoxicillin 500 3 times a day for a further week. He has not been systemically unwell. Still complaining of an area of discomfort right under his left popliteal fossa. There is no open wound on the right leg. He tells me that he is using his pumps twice a day on most days 03/09/18; patient arrives in clinic today completing his amoxicillin today. The cellulitis on his left leg is better. Furthermore he tells me that he had intramuscular penicillin shots that his primary care office today. However he also states that the wrap on his right leg fell down shortly after leaving clinic last week.  He developed a large blister that was present when he came in for a nurse visit later in the week and then he developed intense discomfort around this area.He tells me he is using his compression pumps 03/16/18; the patient has completed his doxycycline. The infectious part of this/cellulitis in the left heel area left popliteal area is a lot better. He has 2 open areas on the right calf. Still areas on the left calf but this is a lot better as well. 03/24/18; the patient arrives complaining of pain in the left popliteal area again. He thinks some of this is wrap injury. He has no open area on the right leg and really no open area on the left calf either except for the popliteal area. He claims to be compliant with the compression pumps 03/31/18; I gave him doxycycline last week because of cellulitis in the left popliteal area. This is a lot better although the surface epithelium is denuded off and response to this. He arrives today with uncontrolled edema in the right calf area as well as a fingernail injury in the right lateral calf. There is only a few open areas on the left 04/06/18; I gave him amoxicillin doxycycline over the last 2 weeks that the amoxicillin should be completing currently. He is not complaining of any pain or systemic symptoms. The only open areas see has is on the right lateral lower leg paradoxically I cannot see anything on the left lower leg. He tells me he is using his compression pumps twice a day on most days. Silver alginate to the wounds that are open under 4 layer compression 04/13/18; he completed antibiotics and has no new complaints. Using his compression pumps. Silver alginate that anything that's opened 04/20/18; he is using his compression pumps religiously. Silver alginate 4 layer compression anything that's opened. He  comes in today with no open wounds on the left leg but 3 on the right including a new one posteriorly. He has 2 on the right lateral and one on the  right posterior. He likes Unna boots on the right leg for reasons that aren't really clear we had the usual 4 layer compression on the left. It may be necessary to move to the 4 layer compression on the right however for now I left them in the Unna boots 04/27/18; he is using his compression pumps at least once a day. He has still the wounds on the right lateral calf. The area right posteriorly has closed. He does not have an open wound on the left under 4 layer compression however on the dorsal left foot just proximal to the toes and the left third toe 2 small open areas were identified 05/11/18; he has not uses compression pumps. The areas on the right lateral calf have coalesced into one large wound necrotic surface. On the left side he has one small wound anteriorly however the edema is now weeping out of a large part of his left leg. He says he wasn't using his pumps because of the weeping fluid. I explained to him that this is the time he needs to pump more 05/18/18; patient states he is using his compression pumps twice a day. The area on the right lateral large wound albeit superficial. On the left side he has innumerable number of small new wounds on the left calf particularly laterally but several anteriorly and medially. All these appear to have healthy granulated base these look like the remnants of blisters however they occurred under compression. The patient arrives in clinic today with his legs somewhat better. There is certainly less edema, less multiple open areas on the left calf and the right anterior leg looks somewhat better as well superficial and a little smaller. However he relates pain and erythema over the last 3-4 days in the thigh and I looked at this today. He has not been systemically unwell no fever no chills no change in blood sugar values 05/25/18; comes in today in a better state. The severe cellulitis on his left leg seems better with the Keflex. Not as tender. He has not  been systemically unwell ooHard to find an open wound on the left lower leg using his compression pumps twice a day ooThe confluent wounds on his right lateral calf somewhat better looking. These will ultimately need debridement I didn't do this today. 06/01/18; the severe cellulitis on the left anterior thigh has resolved and he is completed his Keflex. ooThere is no open wound on the left leg however there is a superficial excoriation at the base of the third toe dorsally. Skin on the bottom of his left foot is macerated looking. ooThe left the wounds on the lateral right leg actually looks some better although he did require debridement of the top half of this wound area with an open curet 06/09/18 on evaluation today patient appears to be doing poorly in regard to his right lower extremity in particular this appears to likely be infected he has very thick purulent discharge along with a bright green tent to the discharge. This makes me concerned about the possibility of pseudomonas. He's also having increased discomfort at this point on evaluation. Fortunately there does not appear to be any evidence of infection spreading to the other location at this time. 06/16/18 on evaluation today patient appears to actually be doing fairly well.  His ulcer has actually diminished in size quite significantly at this point which is good news. Nonetheless he still does have some evidence of infection he did see infectious disease this morning before coming here for his appointment. I did review the results of their evaluation and their note today. They did actually have him discontinue the Cipro and initiate treatment with linezolid at this time. He is doing this for the next seven days and they recommended a follow-up in four months with them. He is the keep a log of the need for intermittent antibiotic therapy between now and when he falls back up with infectious disease. This will help them gaze what exactly  they need to do to try and help them out. 06/23/18; the patient arrives today with no open wounds on the left leg and left third toe healed. He is been using his compression pumps twice a day. On the right lateral leg he still has a sizable wound but this is a lot better than last time I saw this. 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 Has been using silver alginate under 4 layer compression 06/30/18; the only open wound he has is on the right lateral leg and this looks healthy. No debridement is required. We have been using silver alginate. He does not have an open wound on the left leg. There is apparently some drainage from the dorsal proximal third toe on the left although I see no open wound here. 07/03/18 on evaluation today patient was actually here just for a nurse visit rapid change. However when he was here on Wednesday for his rat change due to having been healed on the left and then developing blisters we initiated the wrap again knowing that he would be back today for [place] to reevaluate and see were at. Unfortunately he has developed some cellulitis into the proximal portion of his right lower extremity even into the region of his thigh. 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 He was placed on one as what at that point. Nonetheless he is done with that and has been tolerating it well otherwise. Doxycycline which in the past really did not seem to be effective for him. Nonetheless I think the best option may be for [place] to definitely reinitiate the antibiotics for a longer period of time. 07/07/18; since I last saw this patient a week ago he has had a difficult time. At that point he did not have an open wound on his left leg. We transitioned him into juxta light stockings. He was apparently in the clinic the next day with blisters on the left  lateral and left medial lower calf. He also had weeping edema fluid. He was put back into a compression wrap. He was also in the clinic on Friday with intense erythema in his right thigh. Per the patient he was started on Bactrim however that didn't work at all in terms of relieving his pain and swelling. He has taken 3 doxycycline that he had left over from last time and that seems to of helped. He has blistering on the right thigh as well. 07/14/18; the erythema on his right thigh has gotten better with doxycycline that he is finishing. 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  Presumably this cellulitis in the thigh was not related to that although I think this is a bit concerning going forward. He still has an area on the right lateral calf the blister on the right medial calf just below the knee that was discussed above. On the left 2 small open areas left medial and left lateral. Edema control is adequate. He is using his compression pumps twice a day 07/20/18; continued improvement in the condition of both legs especially the edema in his bilateral thighs. He tells me he is been losing weight through a combination of diet and exercise. He is using his compression pumps twice a day. So overall she made to the remaining wounds 07/27/2018; continued improvement in condition of both legs. His edema is well controlled. The area on the right lateral leg is just about closed he had one blisters show up on the medial left upper calf. We have him in 4 layer compression. He is going on a 10-day trip to [place]. He will be driving. He wants to wear Unna boots because of the lessening amount of constriction. He will not use compression pumps while he is away 08/05/18 on evaluation today patient actually appears to be doing decently well all things considered in regard to his bilateral lower extremities. The  worst ulcer is actually only posterior aspect of his left lower extremity with a four layer compression wrap cut into his leg a couple weeks back. He did have a trip and actually had [employer] for the trip that he is worn since he was last here. Nonetheless he feels like the [employer] actually do better for him his swelling is up a little bit but he also with his trip was not taking his Lasix on a regular set schedule like he was supposed to be. He states that obviously the reason being that he cannot drive and keep going without having to urinate too frequently which makes it difficult. He did not have his pumps with him while he was away either which I think also maybe playing a role here too. 08/13/2018; the patient only has a small open wound on the right lateral calf which is a big improvement in the last month or 2. He also has the area posteriorly just below the posterior fossa on the left which I think was a wrap injury from several weeks ago. He has no current evidence of cellulitis. He tells me he is back into his compression pumps twice a day. He also tells me that while he was at the laundromat somebody stole a section of his extremitease stockings 08/20/2018; back in the clinic with a much improved state. He only has small areas on the right lateral mid calf which is just about healed. This was is more substantial area for quite a prolonged period of time. He has a small open area on the left anterior tibia. The area on the posterior calf just below the popliteal fossa is closed today. He is using his compression pumps twice a day 08/28/2018; patient has no open wound on the right leg. He has a smattering of open areas on the calf with some weeping lymphedema. More problematically than that it looks as though his wraps of slipped down in his usual he has very angry upper area of edema just below the right medial knee and on the right lateral calf. He has no open area on his  feet. The patient is traveling  to [facility] next week. I will send him in an antibiotic. We will continue to wrap the right leg. We ordered extremitease stockings for him last week and I plan to transition the right leg to a stocking when he gets home which will be in 10 days time. As usual he is very reluctant to take his pumps with him when he travels 09/07/2018; patient returns from [facility]. He shows me a picture of his left leg in the mid part of his trip last week with intense fire engine erythema. The picture look bad enough I would have considered sending him to the hospital. Instead he went to the wound care center in [facility]. They did not prescribe him antibiotics but he did take some doxycycline he had leftover from a previous visit. I had given him trimethoprim sulfamethoxazole before he left this did not work according to the patient. This is resulted in some improvement fortunately. He comes back with a large wound on the left posterior calf. Smaller area on the left anterior tibia. Denuded blisters on the dorsal left foot over his toes. Does not have much in the way of wounds on the right leg although he does have a very tender area on the right posterior area just below the popliteal fossa also suggestive of infection. He promises me he is back on his pumps twice a day 09/15/2018; the intense cellulitis in his left lower calf is a lot better. The wound area on the posterior left calf is also so better. However he has reasonably extensive wounds on the dorsal aspect of his second and third toes and the proximal foot just at the base of the toes. There is nothing open on the right leg 09/22/2018; the patient has excellent edema control in his legs bilaterally. He is using his external compression pumps twice a day. He has no open area on the right leg and only the areas in the left foot dorsally second and third toe area on the left side. He does not have any signs of  active cellulitis. 10/06/2018; the patient has good edema control bilaterally. He has no open wound on the right leg. There is a blister in the posterior aspect of his left calf that we had to deal with today. He is using his compression pumps twice a day. There is no signs of active cellulitis. We have been using silver alginate to the wound areas. He still has vulnerable areas on the base of his left first second toes dorsally He has a his extremities stockings and we are going to transition him today into the stocking on the right leg. He is cautioned that he will need to continue to use the compression pumps twice a day. If he notices uncontrolled edema in the right leg he may need to go to 3 times a day. 10/13/2018; the patient came in for a nurse check on Friday he has a large flaccid blister on the right medial calf just below the knee. We unroofed this. He has this and a new area underneath the posterior mid calf which was undoubtedly a blister as well. He also has several small areas on the right which is the area we put his extremities stocking on. 10/19/2018; the patient went to see infectious disease this morning I am not sure if that was a routine follow-up in any case the doxycycline I had given him was discontinued and started on linezolid. He has not started this. It is  easy to look at his left calf and the inflammation and think this is cellulitis however he is very tender in the tissue just below the popliteal fossa and I have no doubt that there is infection going on here. He states the problem he is having is that with the compression pumps the edema goes down and then starts walking the wrap falls down. We will see if we can adhere this. He has 1 or 2 minuscule open areas on the right still areas that are weeping on the posterior left calf, the base of his left second and third toes 10/26/18; back today in clinic with quite of skin breakdown in his left anterior leg. This may have  been infection the area below the popliteal fossa seems a lot better however tremendous epithelial loss on the left anterior mid tibia area over quite inexpensive tissue. He has 2 blisters on the right side but no other open wound here. 10/29/2018; came in urgently to see [place] today and we worked him in for review. He states that the 4 layer compression on the right leg caused pain he had to cut it down to roughly his mid calf this caused swelling above the wrap and he has blisters and skin breakdown today. As a result of the pain he has not been using his pumps. Both legs are a lot more edematous and there is a lot of weeping fluid. 11/02/18; arrives in clinic with continued difficulties in the right leg> left. Leg is swollen and painful. multiple skin blisters and new open areas especially laterally. He has not been using his pumps on the right leg. 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 He is not systemically unwell. 11/09/2018; the patient claims he is being compliant with his pumps. He is finished the doxycycline I gave him last week. Culture I did of the wound on the right lateral leg showed a few very resistant methicillin staph aureus. This was resistant to doxycycline. Nevertheless he states the pain in the leg is a lot better which makes me wonder if the cultured organism was not really what was causing the problem nevertheless this is a very dangerous organism to be culturing out of any wound. His right leg is still a lot larger than the left. He is using an Radio broadcast assistant on this area, he blames a 4-layer compression for causing the original skin breakdown which I doubt is true however I cannot talk him out of it. We have been using silver alginate to all of these areas which were initially blisters 11/16/2018; patient is being compliant with his external compression pumps at twice a day. Miraculously he arrives in clinic today with absolutely no  open wounds. He has better edema control on the left where he has been using 4 layer compression versus wound of wounds on the right and I pointed this out to him. There is no inflammation in the skin in his lower legs which is also somewhat unusual for him. There is no open wounds on the dorsal left foot. He has extremitease stockings at home and I have asked him to bring these in next week. 11/25/18 patient's lower extremity on examination today on the left appears for the most part to be wound free. He does have an open wound on the lateral aspect of the right lower extremity but this is minimal compared to what I've seen in past. He does request that we go ahead and  wrap the left leg as well even though there's nothing open just so hopefully it will not reopen in short order. 1/28; patient has superficial open wounds on the right lateral calf left anterior calf and left posterior calf. His edema control is adequate. 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 We have been using silver alginate as the primary dressing. He claims compliance with his compression pumps 2/4; patient has superficial open wounds on numerous areas of his left calf and again one on the left dorsal foot. The areas on the right lateral calf have healed. The cellulitis that I gave him doxycycline for last week is also resolved this was mostly on the left anterior calf just below the tibial tuberosity. His edema looks fairly well-controlled. He tells me he went to see his primary doctor today and had blood work ordered 2/11; once again he has several open areas on the left calf left tibial area. Most of these are small and appear to have healthy granulation. He does not have anything open on the right. The edema and control in his thighs is pretty good which is usually a good indication he has been using his pumps as requested. 2/18; he  continues to have several small areas on the left calf and left tibial area. Most of these are small healthy granulation. We put him in his stocking on the right leg last week and he arrives with a superficial open area over the right upper tibia and a fairly large area on the right lateral tibia in similar condition. His edema control actually does not look too bad, he claims to be using his compression pumps twice a day 2/25. Continued small areas on the left calf and left tibial area. New areas especially on the right are identified just below the tibial tuberosity and on the right upper tibia itself. There are also areas of weeping edema fluid even without an obvious wound. He does not have a considerable degree of lymphedema but clearly there is more edema here than his skin can handle. He states he is using the pumps twice a day. We have an Unna boot on the right and 4 layer compression on the left. 3/3; he continues to have an area on the right lateral calf and right posterior calf just below the popliteal fossa. There is a fair amount of tenderness around the wound on the popliteal fossa but I did not see any evidence of cellulitis, could just be that the wrap came down and rubbed in this area. ooHe does not have an open area on the left leg however there is an area on the left dorsal foot at the base of the third toe ooWe have been using silver alginate to all wound areas 3/10; he did not have an open area on his left leg last time he was here a week ago. T oday he arrives with a horizontal wound just below the tibial tuberosity and an area on the left lateral calf. He has intense erythema and tenderness in this area. The area is on the right lateral calf and right posterior calf better than last week. We have been using silver alginate as usual 3/18 - Patient returns with 3 small open areas on left calf, and 1 small open area on right calf, the skin looks ok with no significant erythema, he  continues the UNA boot on right and 4 layer  compression on left. The right lateral calf wound is closed , the right posterior is small area. we will continue silver alginate to the areas. 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 He is been tolerating the dressing changes without complication. Fortunately she has made excellent improvement in regard to the overall status of his wounds. Unfortunately every time we cease wrapping him he ends up reopening in causing more significant issues at that point. Again I'm unsure of the best direction to take although I think the lymphedema clinic may be appropriate for him. 02/03/19 on evaluation today patient appears to be doing well in regard to the wounds that we saw him for last week unfortunately he has a new area on the proximal portion of his right medial/posterior lower extremity where the wrap somewhat slowed down and caused swelling and a blister to rub and open. Unfortunately this is the only opening that he has on either leg at this point. 02/17/19 on evaluation today patient's bilateral lower extremities appear to be doing well. He still completely healed in regard to the left lower extremity. In regard to the right lower extremity the area where the wrap and slid down and caused the blister still seems to be slightly open although this is dramatically better than during the last evaluation two weeks ago. I'm very pleased with the way this stands overall. 03/03/19 on evaluation today patient appears to be doing well in regard to his right lower extremity in general although he did have a new blister open this does not appear to be showing any evidence of active infection at this time. Fortunately there's No fevers, chills, nausea, or vomiting noted at this  time. Overall I feel like he is making good progress it does feel like that the right leg will we perform the [employer] seems to do with a bit better than three layer wrap on the left which slid down on him. We may switch to doing bilateral in the book wraps. 5/4; I have not seen Mr. Bressman in quite some time. According to our case manager he did not have an open wound on his left leg last week. He had 1 remaining wound on the right posterior medial calf. He arrives today with multiple openings on the left leg probably were blisters and/or wrap injuries from Unna boots. I do not think the Unna boot's will provide adequate compression on the left. I am also not clear about the frequency he is using the compression pumps. 03/17/19 on evaluation today patient appears to be doing excellent in regard to his lower extremities compared to last week's evaluation apparently. He had gotten significantly worse last week which is unfortunate. The [employer] wrap on the left did not seem to do very well for him at all and in fact it didn't control his swelling significantly enough he had an additional outbreak. Subsequently we go back to the four layer compression wrap on the left. This is good news. At least in that he is doing better and the wound seem to be killing him. He still has not heard anything from the lymphedema clinic. 03/24/19 on evaluation today patient actually appears to be doing much better in regard to his bilateral lower Trinity as compared to last week when I saw him. Fortunately there's no  signs of active infection at this time. He has been tolerating the dressing changes without complication. Overall I'm extremely pleased with the progress and appearance in general. 04/07/19 on evaluation today patient appears to be doing well in regard to his bilateral lower extremities. His swelling is significantly down from where it was previous. With that being said he does have a couple blisters still open  at this point but fortunately nothing that seems to be too severe and again the majority of the larger openings has healed at this time. 04/14/19 on evaluation today patient actually appears to be doing quite well in regard to his bilateral lower extremities in fact I'm not even sure there's anything significantly open at this time at any site. Nonetheless he did have some trouble with these wraps where they are somewhat irritating him secondary to the fact that he has noted that the graph wasn't too close down to the end of this foot in a little bit short as well up to his knee. Otherwise things seem to be doing quite well. 04/21/19 upon evaluation today patient's wound bed actually showed evidence of being completely healed in regard to both lower extremities which is excellent news. There does not appear to be any signs of active infection which is also good news. I'm very pleased in this regard. No fevers, chills, nausea, or vomiting noted at this time. 04/28/19 on evaluation today patient appears to be doing a little bit worse in regard to both lower extremities on the left mainly due to the fact that when he went infection disease the wrap was not wrapped quite high enough he developed a blister above this. On the right he is a small open area of nothing too significant but again this is continuing to give him some trouble he has been were in the Velcro compression that he has at home. 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 He's been tolerating the dressing changes without complication. Fortunately there's no signs of active infection at this time. No fevers, chills, nausea, or vomiting noted at this time. We have been trying to get an appointment with her lymphedema clinic in [facility] but unfortunately nobody can get them on phone with not been able to even fax information over the patient likewise is not been  able to get in touch with them. Overall I'm not sure exactly what's going on here with to reach out again today. 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 Still having a lot of drainage unfortunately. He tells me especially in the left but even on the right. There's no signs of active infection which is good news we've been using so ratcheted up to this point. 05/19/19 on evaluation today patient actually appears to be doing quite well with regard to his left lower extremity which is great news. Fortunately in regard to the right lower extremity has an issues with his wrap and he subsequently did remove this from what I'm understanding. Nonetheless long story short is what he had rewrapped once he removed it subsequently had maggots underneath this wrap whenever he came in for evaluation today. With that being said they were obviously completely cleaned away by the nursing staff. The visit today which is excellent news. However he does appear to potentially have some infection around the right ankle region where the maggots were located  as well. He will likely require anabiotic therapy today. 05/26/19 on evaluation today patient actually appears to be doing much better in regard to his bilateral lower extremities. I feel like the infection is under much better control. With that being said there were maggots noted when the wrap was removed yet again today. Again this could have potentially been left over from previous although at this time there does not appear to be any signs of significant drainage there was obviously on the wrap some drainage as well this contracted gnats or otherwise. Either way I do not see anything that appears to be doing worse in my pinion and in fact I think his drainage has slowed down quite significantly likely mainly due to the fact to his infection being under better control. 06/02/2019 on  evaluation today patient actually appears to be doing well with regard to his bilateral lower extremities there is no signs of active infection at this time which is great news. With that being said he does have several open areas more so on the right than the left but nonetheless these are all significantly better than previously noted. 06/09/2019 on evaluation today patient actually appears to be doing well. His wrap stayed up and he did not cause any problems he had more drainage on the right compared to the left but overall I do not see any major issues at this time which is great news. 06/16/2019 on evaluation today patient appears to be doing excellent with regard to his lower extremities the only area that is open is a new blister that can have opened as of today on the medial ankle on the left. Other than this he really seems to be doing great I see no major issues at this point. 06/23/2019 on evaluation today patient appears to be doing quite well with regard to his bilateral lower extremities. In fact he actually appears to be almost completely healed there is a small area of weeping noted of the right lower extremity just above the ankle. Nonetheless fortunately there is no signs of active infection at this time which is good news. No fevers, chills, nausea, vomiting, or diarrhea. 8/24; the patient arrived for a nurse visit today but complained of very significant pain in the left leg and therefore I was asked to look at this. Noted that he did not have an open area on the left leg last week nevertheless this was wrapped. The patient states that he is not been able to put his compression pumps on the left leg because of the discomfort. He has not been systemically unwell 06/30/2019 on evaluation today patient unfortunately despite being excellent last week is doing much worse with regard to his left lower extremity today. In fact he had to come in for a nurse on Monday where his left leg had to  be rewrapped due to excessive weeping Dr. Leanord Hawking placed him on doxycycline at that point. Fortunately there is no signs of active infection Systemically at this time which is good news. 07/07/2019 in regard to the patient's wounds today he actually seems to be doing well with his right lower extremity there really is nothing open or draining at this point this is great news. Unfortunately the left lower extremity is given him additional trouble at this time. There does not appear to be any signs of active infection nonetheless he does have a lot of edema and swelling noted at this point as well as blistering all of which has led  to a much more poor appearing leg at this time compared to where it was 2 weeks ago when it was almost completely healed. Obviously this is a little discouraging for the patient. He is try to contact the lymphedema clinic in [place] he has not been able to get through to them. 07/14/2019 on evaluation today patient actually appears to be doing slightly better with regard to his left lower extremity ulcers. Overall I do feel like at least at the top of the wrap that we have been placing this area has healed quite nicely and looks much better. The remainder of the leg is showing signs of improvement. Unfortunately in the thigh area he still has an open region on the left and again on the right he has been utilizing just a Band-Aid on an area that also opened on the thigh. Again this is an area that were not able to wrap although we did do an Ace wrap to provide some compression that something that obviously is a little less effective than the compression wraps we have been using on the lower portion of the leg. He does have an appointment with the lymphedema clinic in [facility] on Friday. 07/21/2019 on evaluation today patient appears to be doing better with regard to his lower extremity ulcers. He has been tolerating the dressing changes without complication. Fortunately  there is no signs of active infection at this time. No fevers, chills, nausea, vomiting, or diarrhea. I did receive the paperwork from the physical therapist at the lymphedema clinic in [place]. Subsequently I signed off on that this morning and sent that back to him for further progression with the treatment plan. 07/28/2019 on evaluation today patient appears to be doing very well with regard to his right lower extremity where I do not see any open wounds at this point. Fortunately he is feeling great as far as that is concerned as well. In regard to the left lower extremity he has been having issues with still several areas of weeping and edema although the upper leg is doing better his lower leg still I think is going require the compression wrap at this time. No fevers, chills, nausea, vomiting, or diarrhea. 08/04/2019 on evaluation today patient unfortunately is having new wounds on the right lower extremity. Again we have been using Unna boot wrap on that side. We switched him to using his juxta lite wrap at home. With that being said he tells me he has been using it although his legs extremely swollen and to be honest really does not appear that he has been. I cannot know that for sure however. Nonetheless he has multiple new wounds on the right lower extremity at this time. Obviously we will have to see about getting this rewrapped for him today. 08/11/2019 on evaluation today patient appears to be doing fairly well with regard to his wounds. He has been tolerating the dressing changes including the compression wraps without complication. He still has a lot of edema in his upper thigh regions bilaterally he is supposed to be seeing the lymphedema clinic on the 15th of this month once his wraps arrive for the upper part of his legs. 08/18/2019 on evaluation today patient appears to be doing well with regard to his bilateral lower extremities at this point. He has been tolerating the  dressing changes without complication. Fortunately there is no signs of active infection which is also good news. He does have a couple weeping areas on the first and second  toe of the right foot he also has just a small area on the left foot upper leg and a small area on the left lower leg but overall he is doing quite well in my opinion. He is supposed to be getting his wraps shortly in fact tomorrow and then subsequently is seeing the lymphedema clinic next Wednesday on the 21st. Of note he is also leaving on the 25th to go on vacation for a week to the beach. For that reason and since there is some uncertainty about what there can be doing at lymphedema clinic next Wednesday I am get a make an appointment for next Friday here for [place] to see what we need to do for him prior to him leaving for vacation. 10/23; patient arrives in considerable pain predominantly in the upper posterior calf just distal to the popliteal fossa also in the wound anteriorly above the major wound. This is probably cellulitis and he has had this recurrently in the past. He has no open wound on the right side and he has had an Radio broadcast assistant in that area. Finally I note that he has an area on the left posterior calf which by enlarge is mostly epithelialized. This protrudes beyond the borders of the surrounding skin in the setting of dry scaly skin and lymphedema. The patient is leaving for [facility] on Sunday. Per his longstanding pattern, he will not take his compression pumps with him predominantly out of fear that they will be stolen. He therefore asked that we put a Unna boot back on the right leg. He will also contact the wound care center in [facility] to see if they can change his dressing in the mid week. 11/3; patient returned from his vacation to [facility]. He was seen on 1 occasion at their wound care center. They did a 2 layer compression system as they did not have our 4-layer wrap. I am not completely certain  what they put on the wounds. They did not change the Unna boot on the right. The patient is also seeing a lymphedema specialist physical therapist in [place]. It appears that he has some compression sleeve for his thighs which indeed look quite a bit better than I am used to seeing. He pumps over these with his external compression pumps. 11/10; the patient has a new wound on the right medial thigh otherwise there is no open areas on the right. He has an area on the left leg posteriorly anteriorly and medially and an area over the left second toe. We have been using silver alginate. He thinks the injury on his thigh is secondary to friction from the compression sleeve he has. 11/17; the patient has a new wound on the right medial thigh last week. He thinks this is because he did not have a underlying stocking for his thigh juxta lite apparatus. He now has this. The area is fairly large and somewhat angry but I do not think he has underlying cellulitis. ooHe has a intact blister on the right anterior tibial area. ooSmall wound on the right great toe dorsally ooSmall area on the medial left calf. 11/30; the patient does not have any open areas on his right leg and we did not take his juxta lite stocking off. However he states that on Friday his compression wrap fell down lodging around his upper mid calf area. As usual this creates a lot of problems for him. He called urgently today to be seen for a nurse visit  however the nurse visit turned into a provider visit because of extreme erythema and pain in the left anterior tibia extending laterally and posteriorly. The area that is problematic is extensive 10/06/2019 upon evaluation today patient actually appears to be doing poorly in regard to his left lower extremity. He Dr. Leanord Hawking did place him on doxycycline this past Monday apparently due to the fact that he was doing much worse in regard to this left leg. Fortunately the doxycycline does  seem to be helping. Unfortunately we are still having a very difficult time getting his edema under any type of control in order to anticipate discharge at some point. The only way were really able to control his lymphedema really is with compression wraps and that has only even seemingly temporary. He has been seeing a lymphedema clinic they are trying to help in this regard but still this has been somewhat frustrating in general for the patient. 10/13/19 on evaluation today patient appears to be doing excellent with regard to his right lower extremity as far as the wounds are concerned. His swelling is still quite extensive unfortunately. He is still having a lot of drainage from the thigh areas bilaterally which is unfortunate. He's been going to lymphedema clinic but again he still really does not have this edema under control as far as his lower extremities are concern. With regard to his left lower extremity this seems to be improving and I do believe the doxycycline has been of benefit for him. He is about to complete the doxycycline. 10/20/2019 on evaluation today patient appears to be doing poorly in regard to his bilateral lower extremities. More in the right thigh he has a lot of irritation at this site unfortunately. In regard to the left lower extremity the wrap was not quite as high it appears and does seem to have caused him some trouble as well. Fortunately there is no evidence of systemic infection though he does have some blue-green drainage which has me concerned for the possibility of Pseudomonas. He tells me he is previously taking Cipro without complications and he really does not care for Levaquin however due to some of the side effects he has. He is not allergic to any medications specifically antibiotics that were aware of. 10/27/2019 on evaluation today patient actually does appear to be for the most part doing better when compared to last week's evaluation. With that being  said he still has multiple open wounds over the bilateral lower extremities. He actually forgot to start taking the Cipro and states that he still has the whole bottle. He does have several new blisters on left lower extremity today I think I would recommend he go ahead and take the Cipro based on what I am seeing at this point. 12/30-Patient comes at 1 week visit, 4 layer compression wraps on the left and Unna boot on the right, primary dressing Xtrasorb and silver alginate. Patient is taking his Cipro and has a few more days left probably 5-6, and the legs are doing better. He states he is using his compressions devices which I believe he has 11/10/2019 on evaluation today patient actually appears to be much better than last time I saw him 2 weeks ago. His wounds are significantly improved and overall I am very pleased in this regard. Fortunately there is no signs of active infection at this time. He is just a couple of days away from completing Cipro. Overall his edema is much better he has been using his  lymphedema pumps which I think is also helping at this point. 11/17/2019 on evaluation today patient appears to be doing excellent in regard to his wounds in general. His legs are swollen but not nearly as much as they have been in the past. Fortunately he is tolerating the compression wraps without complication. No fevers, chills, nausea, vomiting, or diarrhea. He does have some erythema however in the distal portion of his right lower extremity specifically around the forefoot and toes there is a little bit of warmth here as well. 11/24/2019 on evaluation today patient appears to be doing well with regard to his right lower extremity I really do not see any open wounds at this point. His left lower extremity does have several open areas and his right medial thigh also is open. Other than this however overall the patient seems to be making good progress and I am very pleased at this point. 12/01/2019  on evaluation today patient appears to be doing poorly at this point in regard to his left lower extremity has several new blisters despite the fact that we have him in compression wraps. In fact he had a 4-layer compression wrap, his upper thigh wrapped from lymphedema clinic, and a juxta light over top of the 4 layer compression wrap the lymphedema clinic applied and despite all this he still develop blisters underneath. Obviously this does have me concerned about the fact that unfortunately despite what we are doing to try to get wounds healed he continues to have new areas arise I do not think he is ever good to be at the point where he can realistically just use wraps at home to keep things under control. Typically when we heal him it takes about 1-2 days before he is back in the clinic with severe breakdown and blistering of his lower extremities bilaterally. This is happened numerous times in the past. Unfortunately I think that we may need some help as far as overall fluid overload to kind of limit what we are seeing and get things under better control. 12/08/2019 on evaluation today patient presents for follow-up concerning his ongoing bilateral lower extremity edema. Unfortunately he is still having quite a bit of swelling the compression wraps are controlling this to some degree but he did see Dr. [name] his cardiologist I do have that available for review today as far as the appointment was concerned that was on 12/06/2019. Obviously that she has been 2 days ago. The patient states that he is only been taking the Lasix 80 mg 1 time a day he had told me previously he was taking this twice a day. Nonetheless Dr. [name] recommended this be up to 80 mg 2 times a day for the patient as he did appear to be fluid overloaded. With that being said the patient states he did this yesterday and he was unable to go anywhere or do anything due to the fact that he was constantly having to urinate. Nonetheless I  think that this is still good to be something that is important for him as far as trying to get his edema under control at all things that he is going to be able to just expect his wounds to get under control and things to be better without going through at least a period of time where he is trying to stabilize his fluid management in general and I think increasing the Lasix is likely the first step here. It was also mentioned the possibility that the patient may  require metolazone. With that being said he wanted to have the patient take Lasix twice a day first and then reevaluating 2 months to see where things stand. 12/15/2019 upon evaluation today patient appears to be doing regard to his legs although his toes are showing some signs of weeping especially on the left at this point to some degree on the right. There does not appear to be any signs of active infection and overall I do feel like the compression wraps are doing well for him but he has not been able to take the Lasix at home and the increased dose that Dr. [name] recommended. He tells me that just not go to be feasible for him. Nonetheless I think in this case he should probably send a message to Dr. [name] in order to discuss options from the standpoint of possible admission to get the fluid off or otherwise going forward. 12/22/2019 upon evaluation today patient appears to be doing fairly well with regard to his lower extremities at this point. In fact he would be doing excellent if it was not for the fact that his right anterior thigh apparently had an allergic reaction to adhesive tape that he used. The wound itself that we have been monitoring actually appears to be healed. There is a lot of irritation at this point. 12/29/2019 upon evaluation today patient appears to be doing well in regard to his lower extremities. His left medial thigh is open and somewhat draining today but this is the only region that is open the right has done much  better with the treatment utilizing the steroid cream that I prescribed for him last week. Overall I am pleased in that regard. Fortunately there is no signs of active infection at this time. No fevers, chills, nausea, vomiting, or diarrhea. 01/05/2020 upon evaluation today patient appears to be doing more poorly in regard to his right lower extremity at this point upon evaluation today. Unfortunately he continues to have issues in this regard and I think the biggest issue is controlling his edema. This obviously is not very well controlled at this point is been recommended that he use the Lasix twice a day but he has not been able to do that. Unfortunately I think this is leading to an issue where honestly he is not really able to effectively control his edema and therefore the wounds really are not doing significantly better. I do not think that he is going to be able to keep things under good control unless he is able to control his edema much better. I discussed this again in great detail with him today. 01/12/2020 good news is patient actually appears to be doing quite well today at this point. He does have an appointment with lymphedema clinic tomorrow. His legs appear healed and the toe on the left is almost completely healed. In general I am very pleased with how things stand at this point. 01/19/2020 upon evaluation today patient appears to actually be doing well in regard to his lower extremities there is nothing open at this point. Fortunately he has done extremely well more recently. Has been seeing lymphedema clinic as well. With that being said he has Velcro wraps for his lower legs as well as his upper legs. The only wound really is on his toe which is the right great toe and this is barely anything even there. With all that being said I think it is good to be appropriate today to go ahead and switch him over  to the Velcro compression wraps. 01/26/2020 upon evaluation today patient appears to  be doing worse with regard to his lower extremities after last week switch him to Velcro compression wraps. Unfortunately he lasted less than 24 hours he did not have the sock portion of his Velcro wrap on the left leg and subsequently developed a blister underneath the Velcro portion. Obviously this is not good and not what we were looking for at this point. He states the lymphedema clinic did tell him to wear the wrap for 23 hours and take him off for 1 I am okay with that plan but again right now we got a get things back under control again he may have some cellulitis noted as well. 02/02/2020 upon evaluation today patient unfortunately appears to have several areas of blistering on his bilateral lower extremities today mainly on the feet. His legs do seem to be doing somewhat better which is good news. Fortunately there is no evidence of active infection at this time. No fevers, chills, nausea, vomiting, or diarrhea. 02/16/2020 upon evaluation today patient appears to be doing well at this time with regard to his legs. He has a couple weeping areas on his toes but for the most part everything is doing better and does appear to be sealed up on his legs which is excellent news. We can continue with wrapping him at this point as he had every time we discontinue the wraps he just breaks out with new wounds. There is really no point in is going forward with this at this point. 03/08/2020 upon evaluation today patient actually appears to be doing quite well with regard to his lower extremity ulcers. He has just a very superficial and really almost nonexistent blister on the left lower extremity he has in general done very well with the compression wraps. With that being said I do not see any signs of infection at this time which is good news. 03/29/2020 upon evaluation today patient appears to be doing well with regard to his wounds currently except for where he had several new areas that opened up due to  some of the wrap slipping and causing him trouble. He states he did not realize they had slipped. Nonetheless he has a 1 area on the right and 3 new areas on the left. Fortunately there is no signs of active infection at this time which is great news. 04/05/2020 upon evaluation today patient actually appears to be doing quite well in general in regard to his legs currently. Fortunately there is no signs of active infection at this time. No fevers, chills, nausea, vomiting, or diarrhea. He tells me next week that he will actually be seen in the lymphedema clinic on Thursday at 10 AM I see him on Wednesday next week. 04/12/2020 upon evaluation today patient appears to be doing very well with regard to his lower extremities bilaterally. In fact he does not appear to have any open wounds at this point which is good news. Fortunately there is no signs of active infection at this time. No fevers, chills, nausea, vomiting, or diarrhea. 04/19/2020 upon evaluation today patient appears to be doing well with regard to his wounds currently on the bilateral lower extremities. There does not appear to be any signs of active infection at this time. Fortunately there is no evidence of systemic infection and overall very pleased at this point. Nonetheless after I held him out last week he literally had blisters the next morning already which swelled up  with him being right back here in the clinic. Overall I think that he is just not can be able to be discharged with his legs the way they are he is much to volume overloaded as far as fluid is concerned and that was discussed with him today of also discussed this but should try the clinic nurse manager as well as Dr. Leanord Hawking. 04/26/2020 upon evaluation today patient appears to be doing better with regard to his wounds currently. He is making some progress and overall swelling is under good control with the compression wraps. Fortunately there is no evidence of active infection  at this time. 05/10/2020 on evaluation today patient appears to be doing overall well in regard to his lower extremities bilaterally. He is Tolerating the compression wraps without complication and with what we are seeing currently I feel like that he is making excellent progress. There is no signs of active infection at this time. 05/24/2020 upon evaluation today patient appears to be doing well in regard to his legs. The swelling is actually quite a bit down compared to where it has been in the past. Fortunately there is no sign of active infection at this time which is also good news. With that being said he does have several wounds on his toes that have opened up at this point. 05/31/2020 upon evaluation today patient appears to be doing well with regard to his legs bilaterally where he really has no significant fluid buildup at this point overall he seems to be doing quite well. Very pleased in this regard. With regard to his toes these also seem to be drying up which is excellent. We have continue to wrap him as every time we tried as a transition to the juxta light wraps things just do not seem to get any better. 06/07/2020 upon evaluation today patient appears to be doing well with regard to his right leg at this point. Unfortunately left leg has a lot of blistering he tells me the wrap started to slide down on him when he tried to put his other Velcro wrap over top of it to help keep things in order but nonetheless still had some issues. 06/14/2020 on evaluation today patient appears to be doing well with regard to his lower extremity ulcers and foot ulcers at this point. I feel like everything is actually showing signs of improvement which is great news overall there is no signs of active infection at this time. No fevers, chills, nausea, vomiting, or diarrhea. 06/21/2020 on evaluation today patient actually appears to be doing okay in regard to his wounds in general. With that being said the biggest  issue I see is on his right foot in particular the first and second toe seem to be doing a little worse due to the fact this is staying very wet. I think he is probably getting need to change out his dressings a couple times in between each week when we see him in regard to his toes in order to keep this drier based on the location and how this is proceeding. 06/28/2020 on evaluation today patient appears to be doing a little bit more poorly overall in regard to the appearance of the skin I am actually somewhat concerned about the possibility of him having a little bit of an infection here. We discussed the course of potentially giving him a doxycycline prescription which he is taken previously with good result. With that being said I do believe that this is potentially mild  and at this point easily fixed. I just do not want anything to get any worse. 07/12/2020 upon evaluation today patient actually appears to be making some progress with regard to his legs which is great news there does not appear to be any evidence of active infection. Overall very pleased with where things stand. 07/26/2020 upon evaluation today patient appears to be doing well with regard to his leg ulcers and toe ulcers at this point. He has been tolerating the compression wraps without complication overall very pleased in this regard. 08/09/2020 upon evaluation today patient appears to be doing well with regard to his lower extremities bilaterally. Fortunately there is no signs of active infection overall I am pleased with where things stand. 08/23/2020 on evaluation today patient appears to be doing well with regard to his wound. He has been tolerating the dressing changes without complication. Fortunately there is no signs of active infection at this time. Overall his legs seem to be doing quite well which is great news and I am very pleased in that regard. No fevers, chills, nausea, vomiting, or diarrhea. 09/13/2020 upon  evaluation today patient appears to be doing okay in regard to his lower extremities. He does have a fairly large blister on the right leg which I did remove the blister tissue from today so we can get this to dry out other than that however he seems to be doing quite well. There is no signs of active infection at this time. 09/27/2020 upon evaluation today patient appears to actually be doing some better in regard to his right leg. Fortunately signs of active infection at this time which is great news. No fevers, chills, nausea, vomiting, or diarrhea. 10/04/2020 upon evaluation today patient actually appears to be showing signs of improvement which is great news with regard to his leg ulcers. Fortunately there is no signs of active infection which is great news he is still taking the antibiotics currently. No fevers, chills, nausea, vomiting, or diarrhea. 10/18/2020 on evaluation today patient appears to be doing well with regard to his legs currently. He has been tolerating the dressing changes including the wraps without complication. Fortunately there is no signs of active infection at this time. No fevers, chills, nausea, vomiting, or diarrhea. 10/25/2020 upon evaluation today patient appears to be doing decently well in regard to his wounds currently. He has been tolerating the dressing changes without complication. Overall I feel like he is making good progress albeit slow. Again this is something we can have to continue to wrap for some time to come most likely. 11/08/2020 upon evaluation today patient appears to be doing well with regard to his wounds currently. He has been tolerating the dressing changes without complication is not currently on any antibiotics and he does not appear to show any signs of infection. He does continue to have a lot of drainage on the right leg not too severe but nonetheless this is very scattered. On the left leg this is looking to be much improved  overall. 11/15/2020 upon evaluation today patient appears to be doing better with regard to his legs bilaterally. Especially the right leg which was much more significant last week. There does not appear to be any signs of active infection which is great news. No fevers, chills, nausea, vomiting, or diarrhea. 11/23/2019 upon evaluation today patient appears to be doing poorly still in regard to his lower extremities bilaterally. Unfortunately his right leg in particular appears to be doing much more poorly there is  no signs really of infection this is not warm to touch but he does have a lot of drainage and weeping unfortunately. With that reason I do believe that we may need to initiate some treatment here to try to help calm down some of the swelling of the right leg. I think switching to a 4-layer compression wrap would be beneficial here. The patient is in agreement with giving this a try. 11/29/2020 upon evaluation today patient appears to be doing well currently in regard to his leg ulcers. I feel like the right leg is doing better he still has a lot of drainage but we do see some improvement here. The 4-layer compression wrap I think was helpful. 12/06/2020 upon evaluation today patient appears to be doing well with regard to his legs. In fact they seem to be doing about the best I have seen up to this point. Fortunately there is no signs of active infection at this time. No fevers, chills, nausea, vomiting, or diarrhea. 12/20/2020 upon evaluation today patient appears to be doing well at this time in regard to his legs. He is not having any significant draining which is great news. Fortunately there is no signs of active infection at this time. No fevers, chills, nausea, vomiting, or diarrhea. 01/17/2021 upon evaluation today Morley actually appears to be doing excellent in regard to his legs. He has a few areas again that come and go as far as his toes are concerned but overall this is doing quite  well. 01/31/2021 upon evaluation today patient appears to be doing well with regard to his legs. Fortunately there does not appear to be any signs of active infection which is great news. Overall he is still having significant edema despite the compression wraps basically the 4-layer compression wrap to just keep things under control there is really not much room for play. 4/13: Mr. Boulden is a longstanding patient in our clinic and benefits greatly from weekly compression wraps. Today he has no complaints. He has been tolerating the wraps well. He states he is using the lymphedema pumps at home. 5/4; patient presents for follow-up of his chronic lymphedema/venous insufficiency ulcers. He comes weekly for compression wraps. He has no complaints today. He was unable to tolerate the Coflex 2 layer Last week so we will do the four press 4-layer compression. He has been using his lymphedema pumps daily. 5/18; patient presents for 2-week follow-up. He has no complaints or issues today. He has developed a new wound to the right foot on his fourth toe. He overall feels well and denies signs of infection. 6/1; patient presents for 2-week follow-up. He has no complaints or issues today. He denies signs of infection. 04/18/2021 upon evaluation today patient appears to be doing well with regard to his legs bilaterally. Family open wound is actually on the toe of his left foot everything else is completely closed which is great news. In general I am extremely pleased with where things stand at this point. The patient is also happy that things are doing so well. 05/02/2021 upon evaluation today patient's legs actually appear to be doing quite well today. Fortunately there does not appear to be any signs of active infection which is great and overall I am extremely pleased with where he stands today. The patient does not appear to have any evidence of active infection at this time which is also great news. 05/09/2021  upon evaluation today patient appears to be doing a little bit more poorly in  regard to his legs. Unfortunately he is having issues with some breakdown and a blood blister on the left leg this is due to I believe honestly to how it was wrapped last week. Fortunately there does not appear to be any signs of infection but nonetheless this is still a concern to be honest. No fevers, chills, nausea, vomiting, or diarrhea. 05/16/2021 upon evaluation today patient appears to be doing significantly better as compared to last week. I am very pleased with where things stand today. There does not appear to be any signs of infection which is great news and overall very pleased with where we stand. No fevers, chills, nausea, vomiting, or diarrhea. 05/30/2021 upon evaluation today patient appears to be doing well with regard to his legs. He has been tolerating the dressing changes without complication. Fortunately there does not appear to be any signs of active infection which is great news and overall I am extremely pleased with where things stand today. No fevers, chills, nausea, vomiting, or diarrhea. 06/20/2021 upon evaluation today patient actually appears to be making good progress today and very pleased with what we are seeing. I think his legs are really maintaining. As long as we continue wrapping he seems to be doing excellent in my opinion. Fortunately there is no signs of active infection at this time. No fevers, chills, nausea, vomiting, or diarrhea. Objective Constitutional Obese and well-hydrated in no acute distress. Vitals Time Taken: 10:46 AM, Height: 70 in, Weight: 380.2 lbs, BMI: 54.5, Temperature: 97.9 F, Pulse: 72 bpm, Respiratory Rate: 20 breaths/min, Blood Pressure: 151/67 mmHg. Respiratory normal breathing without difficulty. Psychiatric this patient is able to make decisions and demonstrates good insight into disease process. Alert and Oriented x 3. pleasant and cooperative. General  Notes: Upon inspection patient's wounds again showed signs of pretty much being closer really is not a second issue here. He does have a small area where he had an abrasion on his toe where his toenails were trimmed by podiatry and to be honest even this looks like it is almost healed and is doing well. Overall I am extremely pleased with everything I see I see no signs of infection. Integumentary (Hair, Skin) Wound #193 status is Open. Original cause of wound was Gradually Appeared. The date acquired was: 01/31/2021. The wound has been in treatment 20 weeks. The wound is located on the Left T Second. The wound measures 0.2cm length x 0.2cm width x 0.1cm depth; 0.031cm^2 area and 0.003cm^3 volume. There oe is Fat Layer (Subcutaneous Tissue) exposed. There is no tunneling or undermining noted. There is a small amount of serous drainage noted. The wound margin is distinct with the outline attached to the wound base. There is large (67-100%) red granulation within the wound bed. There is no necrotic tissue within the wound bed. 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 Post procedure Diagnosis Wound #: Same as Pre-Procedure There was a Four Layer Compression Therapy Procedure by Shawn Stall, RN. Post procedure Diagnosis Wound #: Same as Pre-Procedure Plan Follow-up  Appointments: Return appointment in 3 weeks. - with Leonard Schwartz  07/11/2021 Nurse Visit: - 06/27/2021 and 07/04/2021 Bathing/ Shower/ Hygiene: May shower with protection but do not get wound dressing(s) wet. Edema Control - Lymphedema / SCD / Other: Lymphedema Pumps. Use Lymphedema pumps on leg(s) 2-3 times a day for 45-60 minutes. If wearing any wraps or hose, do not remove them. Continue exercising as instructed. Elevate legs to the level of the heart or above for 30 minutes daily and/or when sitting, a frequency of: - throughout the day Avoid standing for long periods of time. Exercise regularly Non Wound Condition: Other Non Wound Condition Orders/Instructions: - lotion to both legs, 4 layer compression wraps both legs, silver alginate to any weeping areas WOUND #193: - T Second Wound Laterality: Left oe Cleanser: Soap and Water 1 x Per Week/15 Days Discharge Instructions: May shower and wash wound with dial antibacterial soap and water prior to dressing change. Cleanser: Wound Cleanser 1 x Per Week/15 Days Discharge Instructions: Cleanse the wound with wound cleanser prior to applying a clean dressing using gauze sponges, not tissue or cotton balls. Prim Dressing: KerraCel Ag Gelling Fiber Dressing, 2x2 in (silver alginate) 1 x Per Week/15 Days ary Discharge Instructions: Apply silver alginate to wound bed as instructed Secondary Dressing: Woven Gauze Sponges 2x2 in 1 x Per Week/15 Days Discharge Instructions: Apply over primary dressing as directed. Secured With: Insurance underwriter, Sterile 2x75 (in/in) 1 x Per Week/15 Days Discharge Instructions: Secure with stretch gauze as directed. 1. Would recommend currently that we continue with silver alginate to the open wound locations. He seems to be doing well with this. 2. I am going to also go ahead and recommend that we have the patient continue to elevate his legs much as possible to try to help with the pain control. 3. And also can recommend that we have the patient go ahead and  continue with the 4-layer compression wraps bilaterally that seem to be doing a great job. We will see patient back for reevaluation in 1 week here in the clinic. If anything worsens or changes patient will contact our office for additional recommendations. Electronic Signature(s) Signed: 06/20/2021 12:12:51 PM By: Lenda Kelp PA-C Entered By: Lenda Kelp on 06/20/2021 12:12:51 -------------------------------------------------------------------------------- SuperBill Details Patient Name: Date of Service: [name], Ladon J. 06/20/2021 Medical Record Number: [id number] Patient Account Number: [id number] Date of Birth/Sex: Treating RN: 03/19/1951 (70 y.o. 

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
## 2021-06-22 ENCOUNTER — Other Ambulatory Visit: Payer: Self-pay | Admitting: Family Medicine

## 2021-06-24 ENCOUNTER — Encounter: Payer: Self-pay | Admitting: Family Medicine

## 2021-06-25 MED ORDER — HYDROCODONE-ACETAMINOPHEN 5-325 MG PO TABS: ORAL_TABLET | ORAL | 0 refills | Status: DC

## 2021-06-25 NOTE — Telephone Encounter (Signed)
Requesting: [employer] Contract: 04/12/20 UDS: N/A Last Visit:05/02/21 Next Visit:07/11/21 Last Refill:04/30/21(60,0)  Please review and advise, med pending

## 2021-06-27 ENCOUNTER — Encounter: Payer: Self-pay | Admitting: Infectious Diseases

## 2021-06-27 ENCOUNTER — Telehealth (INDEPENDENT_AMBULATORY_CARE_PROVIDER_SITE_OTHER): Payer: Medicare Other | Admitting: Infectious Diseases

## 2021-06-27 ENCOUNTER — Encounter (HOSPITAL_BASED_OUTPATIENT_CLINIC_OR_DEPARTMENT_OTHER): Payer: Medicare Other | Admitting: Physician Assistant

## 2021-06-27 ENCOUNTER — Other Ambulatory Visit: Payer: Self-pay

## 2021-06-27 DIAGNOSIS — E11622 Type 2 diabetes mellitus with other skin ulcer: Secondary | ICD-10-CM | POA: Diagnosis not present

## 2021-06-27 DIAGNOSIS — E1140 Type 2 diabetes mellitus with diabetic neuropathy, unspecified: Secondary | ICD-10-CM | POA: Diagnosis not present

## 2021-06-27 DIAGNOSIS — I890 Lymphedema, not elsewhere classified: Secondary | ICD-10-CM | POA: Diagnosis not present

## 2021-06-27 DIAGNOSIS — I87333 Chronic venous hypertension (idiopathic) with ulcer and inflammation of bilateral lower extremity: Secondary | ICD-10-CM | POA: Diagnosis not present

## 2021-06-27 DIAGNOSIS — L03119 Cellulitis of unspecified part of limb: Secondary | ICD-10-CM | POA: Diagnosis not present

## 2021-06-27 DIAGNOSIS — L97829 Non-pressure chronic ulcer of other part of left lower leg with unspecified severity: Secondary | ICD-10-CM | POA: Diagnosis not present

## 2021-06-27 DIAGNOSIS — L97521 Non-pressure chronic ulcer of other 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 of skin: Secondary | ICD-10-CM | POA: Diagnosis not present

## 2021-06-27 DIAGNOSIS — L97519 Non-pressure chronic ulcer of other part of right foot with unspecified severity: Secondary | ICD-10-CM | POA: Diagnosis not present

## 2021-06-27 DIAGNOSIS — E11621 Type 2 diabetes mellitus with foot ulcer: Secondary | ICD-10-CM | POA: Diagnosis not present

## 2021-06-27 MED ORDER — CEFADROXIL 500 MG PO CAPS: 1000.0000 mg | ORAL_CAPSULE | Freq: Two times a day (BID) | ORAL | 2 refills | Status: DC

## 2021-06-27 NOTE — Assessment & Plan Note (Signed)
He has done very well on prophylactic bicillin injections for recurrent cellulitis. Understandably he has a desire to pause injections and see how he does with a change to his lymphedema management plan. I think this is reasonable with close follow up. I will send in cefadroxil 1gm BID to have on demand for treament should he develop a flare before he sees me next time in December. We discussed starting treatment right away when he notices a flare with TWO 500 mg capsules BID x 7-10 days. If he starts on demand treatment would like him to send me an email so we can arrange a follow up 7d after to ensure adequate treatment for him.  He understands the plan.  He would like to continue with his last injection September 7th at Dr. Samul Dada office then take a pause from bicillin thereafter. Will send a message to Dr. Milinda Cave to let him know.  FU arranged and communicated today at our video visit.

## 2021-06-27 NOTE — Progress Notes (Signed)
TROYE, HIEMSTRA ([id number]) Visit Report for 06/27/2021 Arrival Information Details Patient Name: Date of Service: CO HARLON, KUTNER 06/27/2021 10:30 A M Medical Record Number: [id number] Patient Account Number: [id number] Date of Birth/Sex: Treating RN: 20-Feb-1951 (70 y.o. Lytle Michaels Primary Care Harlo Jaso: Nicoletta Ba Other Clinician: Referring Patrich Heinze: Treating Infantof Villagomez/Extender: Adele Dan in Treatment: 283 Visit Information History Since Last Visit Added or deleted any medications: No Patient Arrived: [name] Any new allergies or adverse reactions: No Arrival Time: 10:36 Had a fall or experienced change in No Transfer Assistance: None activities of daily living that may affect Patient Identification Verified: Yes risk of falls: Secondary Verification Process Completed: Yes Signs or symptoms of abuse/neglect since last visito No Patient Requires Transmission-Based Precautions: No Hospitalized since last visit: No Patient Has Alerts: Yes Implantable device outside of the clinic excluding No cellular tissue based products placed in the center since last visit: Has Dressing in Place as Prescribed: Yes Has Compression in Place as Prescribed: Yes Pain Present Now: No Electronic Signature(s) Signed: 06/27/2021 5:22:48 PM By: Antonieta Iba Entered By: Antonieta Iba on 06/27/2021 10:36:43 -------------------------------------------------------------------------------- Compression Therapy Details Patient Name: Date of Service: [name], Yoshiaki J. 06/27/2021 10:30 A M Medical Record Number: [id number] Patient Account Number: [id number] Date of Birth/Sex: Treating RN: 15-Jul-1951 (70 y.o. Lytle Michaels Primary Care Lindora Alviar: Nicoletta Ba Other Clinician: Referring Jaidan Prevette: Treating Ayan Yankey/Extender: Adele Dan in Treatment: 283 Compression Therapy Performed for Wound Assessment: NonWound Condition Lymphedema -  Right Leg Performed By: Clinician Antonieta Iba, RN Compression Type: Four Layer Electronic Signature(s) Signed: 06/27/2021 5:22:48 PM By: Antonieta Iba Entered By: Antonieta Iba on 06/27/2021 11:15:46 -------------------------------------------------------------------------------- Compression Therapy Details Patient Name: Date of Service: [name], Ruperto J. 06/27/2021 10:30 A M Medical Record Number: [id number] Patient Account Number: [id number] Date of Birth/Sex: Treating RN: 1951-09-19 (70 y.o. Lytle Michaels Primary Care Cruzita Lipa: Nicoletta Ba Other Clinician: Referring Isiaha Greenup: Treating Vi Whitesel/Extender: Adele Dan in Treatment: 283 Compression Therapy Performed for Wound Assessment: NonWound Condition Lymphedema - Left Leg Performed By: Clinician Antonieta Iba, RN Compression Type: Four Layer Electronic Signature(s) Signed: 06/27/2021 5:22:48 PM By: Antonieta Iba Entered By: Antonieta Iba on 06/27/2021 11:16:10 -------------------------------------------------------------------------------- Encounter Discharge Information Details Patient Name: Date of Service: [facility], Barney J. 06/27/2021 10:30 A M Medical Record Number: [id number] Patient Account Number: [id number] Date of Birth/Sex: Treating RN: 1951/04/02 (70 y.o. Lytle Michaels Primary Care Jasalyn Frysinger: Nicoletta Ba Other Clinician: Referring Johm Pfannenstiel: Treating Markeis Allman/Extender: Adele Dan in Treatment: 283 Encounter Discharge Information Items Discharge Condition: Stable Ambulatory Status: Walker Discharge Destination: Home Transportation: Private Auto Schedule Follow-up Appointment: Yes Clinical Summary of Care: Provided on 06/27/2021 Form Type Recipient Paper Patient Patient Electronic Signature(s) Signed: 06/27/2021 5:22:48 PM By: Antonieta Iba Entered By: Antonieta Iba on 06/27/2021  11:17:20 -------------------------------------------------------------------------------- Patient/Caregiver Education Details Patient Name: Date of Service: [name], [name] 8/24/2022andnbsp10:30 A M Medical Record Number: [id number] Patient Account Number: [id number] Date of Birth/Gender: Treating RN: 1951-09-30 (70 y.o. Lytle Michaels Primary Care Physician: Nicoletta Ba Other Clinician: Referring Physician: Treating Physician/Extender: Adele Dan in Treatment: 283 Education Assessment Education Provided To: Patient Education Topics Provided Venous: Methods: Explain/Verbal, Printed Responses: State content correctly Wound/Skin Impairment: Methods: Demonstration, Explain/Verbal, Printed Responses: State content correctly Electronic Signature(s) Signed: 06/27/2021 5:22:48 PM By: Antonieta Iba Entered By: Antonieta Iba on 06/27/2021 11:16:58 -------------------------------------------------------------------------------- Wound Assessment Details Patient Name: Date of Service: [name], Nishawn  J. 06/27/2021 10:30 A M Medical Record Number: [id number] Patient Account Number: [id number] Date of Birth/Sex: Treating RN: 1951-05-19 (70 y.o. Lytle Michaels Primary Care Wakeelah Solan: Nicoletta Ba Other Clinician: Referring Saraih Lorton: Treating Nadim Malia/Extender: Adele Dan in Treatment: 283 Wound Status Wound Number: [id number] Primary Diabetic Wound/Ulcer of the Lower Extremity Etiology: Wound Location: Left T Second oe Wound Open Wounding Event: Gradually Appeared Status: Date Acquired: 01/31/2021 Comorbid Chronic sinus problems/congestion, Arrhythmia, Hypertension, Weeks Of Treatment: 21 History: Peripheral Arterial Disease, Type II Diabetes, History of Burn, Clustered Wound: No Gout, Confinement Anxiety Wound Measurements Length: (cm) 0.2 Width: (cm) 0.2 Depth: (cm) 0.1 Area: (cm) 0.031 Volume: (cm) 0.003 %  Reduction in Area: 98.8% % Reduction in Volume: 98.9% Epithelialization: Large (67-100%) Tunneling: No Undermining: No Wound Description Classification: Grade 2 Wound Margin: Distinct, outline attached Exudate Amount: Medium Exudate Type: Serous Exudate Color: amber Wound Bed Granulation Amount: Large (67-100%) Exposed Structure Granulation Quality: Red Fascia Exposed: No Necrotic Amount: None Present (0%) Fat Layer (Subcutaneous Tissue) Exposed: Yes Tendon Exposed: No Muscle Exposed: No Joint Exposed: No Bone Exposed: No Treatment Notes Wound #193 (Toe Second) Wound Laterality: Left Cleanser Soap and Water Discharge Instruction: May shower and wash wound with dial antibacterial soap and water prior to dressing change. Wound Cleanser Discharge Instruction: Cleanse the wound with wound cleanser prior to applying a clean dressing using gauze sponges, not tissue or cotton balls. Peri-Wound Care Topical Primary Dressing KerraCel Ag Gelling Fiber Dressing, 2x2 in (silver alginate) Discharge Instruction: Apply silver alginate to wound bed as instructed Secondary Dressing Woven Gauze Sponges 2x2 in Discharge Instruction: Apply over primary dressing as directed. Secured With Conforming Stretch Gauze Bandage, Sterile 2x75 (in/in) Discharge Instruction: Secure with stretch gauze as directed. Compression Wrap Compression Stockings Add-Ons Electronic Signature(s) Signed: 06/27/2021 5:22:48 PM By: Antonieta Iba Entered By: Antonieta Iba on 06/27/2021 10:47:09 -------------------------------------------------------------------------------- Vitals Details Patient Name: Date of Service: [name], Tyvon J. 06/27/2021 10:30 A M Medical Record Number: [id number] Patient Account Number: [id number] Date of Birth/Sex: Treating RN: 07-02-1951 (70 y.o. Lytle Michaels Primary Care Adolphe Fortunato: Nicoletta Ba Other Clinician: Referring Juddson Cobern: Treating Solymar Grace/Extender: Adele Dan in Treatment: 283 Vital Signs Time Taken: 10:45 Temperature (F): 98.4 Height (in): 70 Pulse (bpm): 67 Weight (lbs): 380.2 Respiratory Rate (breaths/min): 20 Body Mass Index (BMI): 54.5 Blood Pressure (mmHg): 149/66 Reference Range: 80 - 120 mg / dl Electronic Signature(s) Signed: 06/27/2021 5:22:48 PM By: Antonieta Iba Entered By: Antonieta Iba on 06/27/2021 10:45:33

## 2021-06-27 NOTE — Assessment & Plan Note (Signed)
He has been working with the wound clinic for some time now - has decided that he needs the 4-layer wraps consistently to help with controlling lymphedema. He and his team have noticed that he typically flares with cellulitis after wraps are removed for a period of time. I encouraged him to continue working with their recommendations to decrease swelling in legs as this is his largest risk factor for recurrent episodes.

## 2021-06-27 NOTE — Progress Notes (Signed)
JUNIOR, HUEZO ([id number]) Visit Report for 06/27/2021 SuperBill Details Patient Name: Date of Service: CO TONEY, DIFATTA 06/27/2021 Medical Record Number: [id number] Patient Account Number: [id number] Date of Birth/Sex: Treating RN: 09-29-1951 (70 y.o. Lytle Michaels Primary Care Provider: Nicoletta Ba Other Clinician: Referring Provider: Treating Provider/Extender: Adele Dan in Treatment: 283 Diagnosis Coding ICD-10 Codes Code Description [phone] Non-pressure chronic ulcer of other 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 of skin L97.829 Non-pressure chronic ulcer of other part of left lower leg with unspecified severity L97.519 Non-pressure chronic ulcer of other part of right foot with unspecified severity I87.333 Chronic venous hypertension (idiopathic) with ulcer and inflammation of bilateral lower extremity I89.0 Lymphedema, not elsewhere classified E11.622 Type 2 diabetes mellitus with other skin ulcer E11.40 Type 2 diabetes mellitus with diabetic neuropathy, unspecified Facility Procedures CPT4 Description Modifier Quantity Code [id number] [phone] BILATERAL: Application of multi-layer venous compression system; leg (below knee), including ankle and 1 foot. ICD-10 Diagnosis Description I89.0 Lymphedema, not elsewhere classified Electronic Signature(s) Signed: 06/27/2021 5:22:48 PM By: Antonieta Iba Signed: 06/27/2021 6:37:42 PM By: Lenda Kelp PA-C Entered By: Antonieta Iba on 06/27/2021 11:17:37

## 2021-06-27 NOTE — Progress Notes (Signed)
Name: [name]  DOB: 11/27/1950  MRN: [id number]  PCP: [name], MD  Referring Provider: Dr. Leanord Hawking   Virtual Visit via Mychart Video  I connected with [name] on 06/27/21 at  1:45 PM EDT by VIDEO Portal and verified that I am speaking with the correct person using two identifiers.   I discussed the limitations, risks, security and privacy concerns of performing an evaluation and management via virtual service and the availability of in person appointments. I also discussed with the patient that there may be a patient responsible charge related to this service. The patient expressed understanding and agreed to proceed.  Patient Location: [employer], [place]  Provider Location: [facility]    Subjective:   CC:  Follow up on recurrent lower extremity cellulitis infections on prophylactic bicillin injections.    HPI: No medical concerns today but has had some stress surrounding his apartment lately. Hopeful that he as a good resolution to this problem. Has had increased blood pressure today with SBP > 200. He has increased his Metoprolol to a whole tablet at times when his SBP is > 200.   LOV with [place] at the wound clinic 8/17 - seems that he continually has trouble with swelling/lymphedema and recommended 4-layer compression wraps bilaterally. Has trouble with some friction blisters from these wraps too. Had a good follow up here with no signs of infection noted at this visit.  Has been keeping legs up more frequently at night. He would like to consider last injection in September and see if increasing the consistency of the wraps is enough to help prevent cellulitis flares. He has not required any extra antibiotic therapy for cellulitis flares in > 8 months   Review of Systems  Constitutional:  Negative for chills, fever and malaise/fatigue.  HENT:  Negative for tinnitus.   Eyes:  Negative for blurred vision and photophobia.  Respiratory:  Negative for cough  and sputum production.   Cardiovascular:  Positive for leg swelling. Negative for chest pain.  Gastrointestinal:  Negative for diarrhea, nausea and vomiting.  Genitourinary:  Negative for dysuria.  Skin:  Negative for rash.  Neurological:  Negative for headaches.    Past Medical History:  Diagnosis Date   ALLERGIC RHINITIS 08/11/2006   ASTHMA 08/11/2006   Chronic atrial fibrillation (HCC) 08/2014   Chronic combined systolic and diastolic CHF (congestive heart failure) [facility])    Cardiology f/u 12/2017: pt volume overloaded (R heart dysf suspected), BNP very high, lasix increased.  Repeat echo 12/2017: normal LV EF, mild DD, +RV syst dysfxn, mod pulm HTN, biatrial enlgmt.   Chronic constipation    Chronic renal insufficiency, stage 2 (mild)    Borderline stage III (GFR 60s).   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  Plan repeat CBC at f/u summer 2019 and if decreased from baseline will check hemoccults and iron labs again.   OBESITY, MORBID 12/14/2007   saxenda started 05/2020 by [facility] wt mgmt center   OSA (obstructive sleep apnea)    to get sleep study with [facility]) as of 12/03/2018 consult.   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  92018 ID started him on amoxil prophylaxis---ineffective.  End 2018/Jan 2019 penicillin G injections prophyl helpful but pt declined to continue this as of 01/2018 ID f/u.  ID talked him into resuming monthly penicillin G as of 02/2018 f/u.     Restless leg syndrome    Rx'd clonazepam 09/2017 and pt refused to take it after reading the medication's potential side  effects.   Venous stasis ulcers of both lower extremities (HCC)    Severe lymphedema.  wound clinic care ongoing as of 01/2018   Outpatient Medications Prior to Visit  Medication Sig Dispense Refill   arginine 500 MG tablet Take by mouth.     b complex vitamins capsule Take 1 capsule by mouth every morning.     butalbital-acetaminophen-caffeine (FIORICET WITH CODEINE) 50-325-40-30 MG capsule TAKE 1 TO 2 CAPSULES BY  MOUTH EVERY 6 HOURS AS  NEEDED FOR HEADACHE(S)  MANUFACTURER RECOMMENDS NOT EXCEEDING 6 CAPSULES/DAY 60 capsule 0   cetirizine (ZYRTEC) 10 MG tablet Take 10 mg by mouth daily.     CHROMIUM GTF PO Take by mouth daily.     Coenzyme Q10 (CO Q 10) 10 MG CAPS Take by mouth. Reported on 02/19/2016     diltiazem (TIAZAC) 240 MG 24 hr capsule TAKE 1 CAPSULE BY MOUTH  DAILY 90 capsule 1   furosemide (LASIX) 40 MG tablet TAKE 2 TABLETS BY MOUTH TWO TIMES DAILY (Patient taking differently: Take 80 mg in the mornings and 40 mg in the evenings) 360 tablet 3   Glutamine 500 MG CAPS Take by mouth.     HYDROcodone-acetaminophen (NORCO/VICODIN) 5-325 MG tablet 1-2 tabs po bid prn pain 60 tablet 0   Insulin Lispro Prot & Lispro (HUMALOG MIX 75/25 KWIKPEN) (75-25) 100 UNIT/ML Kwikpen INJECT SUBCUTANEOUSLY 25  UNITS EVERY MORNING AND 20  UNITS EVERY EVENING AT  SUPPER 45 mL 3   Insulin Pen Needle (B-D ULTRAFINE III SHORT PEN) 31G X 8 MM MISC USE 1 PENNEEDLE TWICE DAILY 200 each 1   lisinopril (ZESTRIL) 40 MG tablet TAKE 1 TABLET BY MOUTH  DAILY 90 tablet 3   metFORMIN (GLUCOPHAGE) 1000 MG tablet TAKE 1 TABLET BY MOUTH  TWICE DAILY WITH MEALS 180 tablet 1   metoprolol tartrate (LOPRESSOR) 50 MG tablet Take 0.5 tablets (25 mg total) by mouth 2 (two) times daily. 90 tablet 3   Multiple Vitamin (MULTIVITAMIN) tablet Take 1 tablet by mouth daily.     ONETOUCH ULTRA test strip CHECK BLOOD SUGAR TWICE  DAILY 200 strip 3   rivaroxaban (XARELTO) 20 MG TABS tablet TAKE 1 TABLET BY MOUTH ONCE DAILY WITH SUPPER 90  tablet 3   terazosin (HYTRIN) 10 MG capsule TAKE 1 CAPSULE BY MOUTH  ONCE DAILY AT BEDTIME 90 capsule 3   vitamin E 400 UNIT capsule Take 400 Units by mouth every morning. Selenium 50mg      spironolactone (ALDACTONE) 25 MG tablet Take 1 tablet (25 mg total) by mouth daily. 30 tablet 0   Facility-Administered Medications Prior to Visit  Medication Dose Route Frequency Provider Last Rate Last Admin   penicillin g benzathine (BICILLIN LA) 1200000 UNIT/2ML injection 600,000 Units  600,000 Units Intramuscular Q30 days , MD   600,000 Units at 04/12/20 0933   penicillin g benzathine (BICILLIN LA) 1200000 UNIT/2ML injection 600,000 Units  600,000 Units Intramuscular Q30 days 06/12/20, MD   600,000 Units at 04/04/21 0814   penicillin g benzathine (BICILLIN LA)  1200000 UNIT/2ML injection 600,000 Units  600,000 Units Intramuscular Q30 days [name], MD   600,000 Units at 06/13/21 1148   penicillin g benzathine (BICILLIN LA) 1200000 UNIT/2ML injection 600,000 Units  600,000 Units Intramuscular Q30 days [name], MD   600,000 Units at 06/13/21 1146   Allergies  Allergen Reactions   Other Other (See Comments)    Sneezing, coughing   Hydralazine Other (See Comments)    Drowsiness/sedation/mental fog    Physical Exam and Objective Findings: There were no vitals filed for this visit. There is no height or weight on file to calculate BMI.  Physical Exam Constitutional:      Appearance: Normal appearance. He is not ill-appearing.  HENT:     Head: Normocephalic.     Mouth/Throat:     Mouth: Mucous membranes are moist.     Pharynx: Oropharynx is clear.  Eyes:     General: No scleral icterus. Pulmonary:     Effort: Pulmonary effort is normal.  Musculoskeletal:        General: Normal range of motion.     Cervical back: Normal range of motion.  Skin:    Coloration: Skin is not jaundiced or pale.  Neurological:     Mental Status: He is alert and oriented  to person, place, and time.  Psychiatric:        Mood and Affect: Mood normal.        Judgment: Judgment normal.      ASSESSMENT & PLAN:  Problem List Items Addressed This Visit       Unprioritized   Recurrent cellulitis of lower extremity    He has done very well on prophylactic bicillin injections for recurrent cellulitis. Understandably he has a desire to pause injections and see how he does with a change to his lymphedema management plan. I think this is reasonable with close follow up. I will send in cefadroxil 1gm BID to have on demand for treament should he develop a flare before he sees me next time in December. We discussed starting treatment right away when he notices a flare with TWO 500 mg capsules BID x 7-10 days. If he starts on demand treatment would like him to send me an email so we can arrange a follow up 7d after to ensure adequate treatment for him.  He understands the plan.  He would like to continue with his last injection September 7th at Dr. Samul Dada office then take a pause from bicillin thereafter. Will send a message to Dr. Milinda Cave to let him know.  FU arranged and communicated today at our video visit.       Relevant Medications   cefadroxil (DURICEF) 500 MG capsule   Lymphedema of both lower extremities - Primary    He has been working with the wound clinic for some time now - has decided that he needs the 4-layer wraps consistently to help with controlling lymphedema. He and his team have noticed that he typically flares with cellulitis after wraps are removed for a period of time. I encouraged him to continue working with their recommendations to decrease swelling in legs as this is his largest risk factor for recurrent episodes.         Rexene Alberts, MSN, NP-C [facility]ious [facility]  [email or web address] Pager: [phone] Office: [phone] [facility] Main Line: [phone]   06/27/2021

## 2021-07-04 ENCOUNTER — Encounter: Payer: Self-pay | Admitting: Medical

## 2021-07-04 ENCOUNTER — Encounter (HOSPITAL_BASED_OUTPATIENT_CLINIC_OR_DEPARTMENT_OTHER): Payer: Medicare Other | Admitting: Physician Assistant

## 2021-07-04 ENCOUNTER — Other Ambulatory Visit: Payer: Self-pay

## 2021-07-04 ENCOUNTER — Ambulatory Visit (INDEPENDENT_AMBULATORY_CARE_PROVIDER_SITE_OTHER): Payer: Medicare Other | Admitting: Medical

## 2021-07-04 VITALS — BP 118/56 | HR 45 | Ht 70.0 in | Wt 378.0 lb

## 2021-07-04 DIAGNOSIS — E119 Type 2 diabetes mellitus without complications: Secondary | ICD-10-CM | POA: Diagnosis not present

## 2021-07-04 DIAGNOSIS — L97829 Non-pressure chronic ulcer of other part of left lower leg with unspecified severity: Secondary | ICD-10-CM | POA: Diagnosis not present

## 2021-07-04 DIAGNOSIS — I10 Essential (primary) hypertension: Secondary | ICD-10-CM | POA: Diagnosis not present

## 2021-07-04 DIAGNOSIS — I890 Lymphedema, not elsewhere classified: Secondary | ICD-10-CM | POA: Diagnosis not present

## 2021-07-04 DIAGNOSIS — I5042 Chronic combined systolic (congestive) and diastolic (congestive) heart failure: Secondary | ICD-10-CM

## 2021-07-04 DIAGNOSIS — R001 Bradycardia, unspecified: Secondary | ICD-10-CM

## 2021-07-04 DIAGNOSIS — I87333 Chronic venous hypertension (idiopathic) with ulcer and inflammation of bilateral lower extremity: Secondary | ICD-10-CM | POA: Diagnosis not present

## 2021-07-04 DIAGNOSIS — E1140 Type 2 diabetes mellitus with diabetic neuropathy, unspecified: Secondary | ICD-10-CM | POA: Diagnosis not present

## 2021-07-04 DIAGNOSIS — I4821 Permanent atrial fibrillation: Secondary | ICD-10-CM | POA: Diagnosis not present

## 2021-07-04 DIAGNOSIS — L97519 Non-pressure chronic ulcer of other part of right foot with unspecified severity: Secondary | ICD-10-CM | POA: Diagnosis not present

## 2021-07-04 DIAGNOSIS — L97521 Non-pressure chronic ulcer of other 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 of skin: Secondary | ICD-10-CM | POA: Diagnosis not present

## 2021-07-04 DIAGNOSIS — E11621 Type 2 diabetes mellitus with foot ulcer: Secondary | ICD-10-CM | POA: Diagnosis not present

## 2021-07-04 DIAGNOSIS — E11622 Type 2 diabetes mellitus with other skin ulcer: Secondary | ICD-10-CM | POA: Diagnosis not present

## 2021-07-04 MED ORDER — METOPROLOL SUCCINATE ER 25 MG PO TB24: 12.5000 mg | ORAL_TABLET | Freq: Every day | ORAL | 6 refills | Status: DC

## 2021-07-04 NOTE — Progress Notes (Signed)
KEELYN, FJELSTAD ([id number]) Visit Report for 07/04/2021 SuperBill Details Patient Name: Date of Service: CO KINGJAMES, COURY 07/04/2021 Medical Record Number: [id number] Patient Account Number: [id number] Date of Birth/Sex: Treating RN: Oct 09, 1951 (70 y.o. [name], [name] Primary Care Provider: Nicoletta Ba Other Clinician: Referring Provider: Treating Provider/Extender: Adele Dan in Treatment: 284 Diagnosis Coding ICD-10 Codes Code Description [phone] Non-pressure chronic ulcer of other 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 of skin L97.829 Non-pressure chronic ulcer of other part of left lower leg with unspecified severity L97.519 Non-pressure chronic ulcer of other part of right foot with unspecified severity I87.333 Chronic venous hypertension (idiopathic) with ulcer and inflammation of bilateral lower extremity I89.0 Lymphedema, not elsewhere classified E11.622 Type 2 diabetes mellitus with other skin ulcer E11.40 Type 2 diabetes mellitus with diabetic neuropathy, unspecified Facility Procedures CPT4 Description Modifier Quantity Code [id number] [phone] BILATERAL: Application of multi-layer venous compression system; leg (below knee), including ankle and 1 foot. Electronic Signature(s) Signed: 07/04/2021 5:21:16 PM By: Lenda Kelp PA-C Signed: 07/04/2021 6:06:32 PM By: Shawn Stall Entered By: Shawn Stall on 07/04/2021 12:01:11

## 2021-07-04 NOTE — Progress Notes (Signed)
Cardiology Office Note   Date:  07/04/2021   ID:  [name], DOB 06/06/1951, MRN [id number]  PCP:  Jeoffrey MassedMcGowen, [name] H, MD  Cardiologist:  Chrystie NoseKenneth C Hilty, MD EP: None  Chief Complaint  Patient presents with   Follow-up    bradycardia       History of Present Illness: [name] is a 70 y.o. male with a PMH of chronic atrial fibrillation, chronic combined CHF, HTN, DM type 2, CKD stage 3a, RLS, and OSA, who presents for 2 month follow-up  He was last evaluated by cardiology at an outpatient visit with Kevin CourseHao Meng, PA-C 04/18/21 at which time he was under a significant amount of stress as his apartment complex sold and he was given 60 days to evacuate resulting in elevated blood pressures. He was noted to have HR's in the 40s during that visit though thankfully was asymptomatic. He was recommended to decreased his metoprolol from 50mg  BID to 25mg  BID. He was recommended to follow-up in 2 months for close monitoring of his bradycardia. His last echocardiogram in 2019 showed EF 60-65%, no RWMA, mild RV dilation with mildly reduced systolic function, moderate-severe RAE, mild-moderate LAE, moderately increased PA pressures, and no significant valvular abnormalities.   He presents today for follow-up of his bradycardia. HR continues to be in the 40s. Again he remains asymptomatic. He thinks he's found a new apartment but they do not have any open units currently - he is on the wait list. Still having spikes in stress with associated spikes in BP. He has not had any chest pain, change in chronic SOB or LE edema, palpitations, dizziness, lightheadedness, syncope, or bleeding with xarelto. He just came from wound clinic for leg wrappings.    Past Medical History:  Diagnosis Date   ALLERGIC RHINITIS 08/11/2006   ASTHMA 08/11/2006   Chronic atrial fibrillation (HCC) 08/2014   Chronic combined systolic and diastolic CHF (congestive heart failure) [facility](HCC)    Cardiology f/u 12/2017: pt  volume overloaded (R heart dysf suspected), BNP very high, lasix increased.  Repeat echo 12/2017: normal LV EF, mild DD, +RV syst dysfxn, mod pulm HTN, biatrial enlgmt.   Chronic constipation    Chronic renal insufficiency, stage 2 (mild)    Borderline stage III (GFR 60s).   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  Plan repeat CBC at f/u summer 2019 and if decreased from baseline will check hemoccults and iron labs again.   OBESITY, MORBID 12/14/2007   saxenda started 05/2020 by [facility] wt mgmt center   OSA (obstructive sleep apnea)    to get sleep study with [facility] as of 12/03/2018 consult.   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  92018 ID started him on amoxil prophylaxis---ineffective.  End 2018/Jan 2019 penicillin G injections prophyl helpful but pt declined to continue this as of 01/2018 ID f/u.  ID talked him into resuming monthly penicillin G as of 02/2018 f/u.     Restless leg syndrome    Rx'd clonazepam 09/2017 and pt refused to take it after reading the medication's potential side effects.  Venous stasis ulcers of both lower extremities (HCC)    Severe lymphedema.  wound clinic care ongoing as of 01/2018    Past Surgical History:  Procedure Laterality Date   Carotid dopplers  07/23/2018   Left NORMAL.  Right 1-39% ICA stenosis, with <50% distal CCA stenosis (not hemodynamically significant)   TRANSTHORACIC ECHOCARDIOGRAM  11/2007; 09/2014; 11/2015;12/2017   LV fxn normal, EF normal, mild dilation of left atrium.  2015 grade II diast dysfxn.  2017 EF 55-60%. 2019: LVEF 60-65%, mild RV syst  dysf,biatrial enlgmt, mod pulm htn.   URETERAL STENT PLACEMENT     virtual colonoscopy  01/2011   Normal     Current Outpatient Medications  Medication Sig Dispense Refill   arginine 500 MG tablet Take by mouth.     b complex vitamins capsule Take 1 capsule by mouth every morning.     butalbital-acetaminophen-caffeine (FIORICET WITH CODEINE) 50-325-40-30 MG capsule TAKE 1 TO 2 CAPSULES BY  MOUTH EVERY 6 HOURS AS  NEEDED FOR HEADACHE(S)  MANUFACTURER RECOMMENDS NOT EXCEEDING 6 CAPSULES/DAY 60 capsule 0   cetirizine (ZYRTEC) 10 MG tablet Take 10 mg by mouth daily.     CHROMIUM GTF PO Take by mouth daily.     Coenzyme Q10 (CO Q 10) 10 MG CAPS Take by mouth. Reported on 02/19/2016     diltiazem (TIAZAC) 240 MG 24 hr capsule TAKE 1 CAPSULE BY MOUTH  DAILY 90 capsule 1   furosemide (LASIX) 40 MG tablet TAKE 2 TABLETS BY MOUTH TWO TIMES DAILY (Patient taking differently: Take 80 mg in the mornings and 40 mg in the evenings) 360 tablet 3   Glutamine 500 MG CAPS Take by mouth.     HYDROcodone-acetaminophen (NORCO/VICODIN) 5-325 MG tablet 1-2 tabs po bid prn pain 60 tablet 0   Insulin Lispro Prot & Lispro (HUMALOG MIX 75/25 KWIKPEN) (75-25) 100 UNIT/ML Kwikpen INJECT SUBCUTANEOUSLY 25  UNITS EVERY MORNING AND 20  UNITS EVERY EVENING AT  SUPPER 45 mL 3   Insulin Pen Needle (B-D ULTRAFINE III SHORT PEN) 31G X 8 MM MISC USE 1 PENNEEDLE TWICE DAILY 200 each 1   lisinopril (ZESTRIL) 40 MG tablet TAKE 1 TABLET BY MOUTH  DAILY 90 tablet 3   metFORMIN (GLUCOPHAGE) 1000 MG tablet TAKE 1 TABLET BY MOUTH  TWICE DAILY WITH MEALS 180 tablet 1   metoprolol succinate (TOPROL XL) 25 MG 24 hr tablet Take 0.5 tablets (12.5 mg total) by mouth daily. 15 tablet 6   Multiple Vitamin (MULTIVITAMIN) tablet Take 1 tablet by mouth daily.     ONETOUCH ULTRA test strip CHECK BLOOD SUGAR TWICE  DAILY 200 strip 3   rivaroxaban (XARELTO) 20 MG TABS tablet TAKE 1 TABLET BY MOUTH ONCE DAILY WITH SUPPER 90 tablet 3   terazosin  (HYTRIN) 10 MG capsule TAKE 1 CAPSULE BY MOUTH  ONCE DAILY AT BEDTIME 90 capsule 3   vitamin E 400 UNIT capsule Take 400 Units by mouth every morning. Selenium 50mg      Current Facility-Administered Medications  Medication Dose Route Frequency Provider Last Rate Last Admin   penicillin g benzathine (BICILLIN LA) 1200000 UNIT/2ML injection 600,000 Units  600,000 Units Intramuscular Q30 days , MD   600,000 Units at 04/12/20 0933   penicillin g benzathine (BICILLIN LA) 1200000 UNIT/2ML injection 600,000 Units  600,000 Units Intramuscular Q30 days 06/12/20, MD   600,000 Units at 04/04/21 0814   penicillin g benzathine (BICILLIN LA) 1200000 UNIT/2ML injection 600,000 Units  600,000  Units Intramuscular Q30 days [name], MD   600,000 Units at 06/13/21 1148   penicillin g benzathine (BICILLIN LA) 1200000 UNIT/2ML injection 600,000 Units  600,000 Units Intramuscular Q30 days [name], MD   600,000 Units at 06/13/21 1146    Allergies:   Other and Hydralazine    Social History:  The patient  reports that he has never smoked. He has never used smokeless tobacco. He reports that he does not drink alcohol and does not use drugs.   Family History:  The patient's family history includes Diabetes in his mother and sister; Hydrocephalus in his sister.    ROS:  Please see the history of present illness.   Otherwise, review of systems are positive for none.   All other systems are reviewed and negative.    PHYSICAL EXAM: VS:  BP (!) 118/56 (BP Location: Left Arm, Patient Position: Sitting, Cuff Size: Large)   Pulse (!) 45   Ht 5\' 10"  (1.778 m)   Wt (!) 378 lb (171.5 kg)   SpO2 95%   BMI 54.24 kg/m  , BMI Body mass index is 54.24 kg/m. GEN: morbidly obese gentleman sitting in chair in no acute distress HEENT: sclera anicteric Neck: no JVD, carotid bruits, or masses Cardiac: bradycardic, irregular rhythm; no murmurs, rubs, or gallops, legs wrapped bilateral  for LE edema  Respiratory:  clear to auscultation bilaterally, normal work of breathing GI: soft, obese, nontender, nondistended, + BS MS: no deformity or atrophy Skin: warm and dry, no rash Neuro:  Strength and sensation are intact Psych: euthymic mood, full affect   EKG:  EKG is ordered today. The ekg ordered today demonstrates atrial fibrillation, rate 53 bpm, chronic RBBB, no STE/D, no significant change from previous   Recent Labs: 04/04/2021: ALT 11; Hemoglobin 13.4; Platelets 236 05/09/2021: BUN 26; Creatinine, Ser 1.51; Potassium 4.4; Sodium 138    Lipid Panel    Component Value Date/Time   CHOL 101 04/04/2021 0844   TRIG 61 04/04/2021 0844   HDL 41 04/04/2021 0844   CHOLHDL 2.5 04/04/2021 0844   VLDL 12.2 07/12/2020 1015   LDLCALC 46 04/04/2021 0844      Wt Readings from Last 3 Encounters:  07/04/21 (!) 378 lb (171.5 kg)  05/02/21 (!) 375 lb (170.1 kg)  04/27/21 (!) 374 lb 12.8 oz (170 kg)      Other studies Reviewed: Additional studies/ records that were reviewed today include:   Echocardiogram 2019: - Left ventricle: The cavity size was normal. Wall thickness was    normal. Systolic function was normal. The estimated ejection    fraction was in the range of 60% to 65%. Wall motion was normal;    there were no regional wall motion abnormalities.  - Ventricular septum: The contour showed diastolic flattening.  - Aortic valve: Moderately calcified annulus.  - Mitral valve: Moderately calcified annulus.  - Left atrium: The atrium was mildly to moderately dilated.  - Right ventricle: The cavity size was mildly dilated. Systolic    function was mildly reduced.  - Right atrium: The atrium was moderately to severely dilated.  - Pulmonary arteries: Systolic pressure was moderately increased.    PA peak pressure: 53 mm Hg (S).   NST 2015: Overall Impression:  Low risk stress nuclear study Diaphragmatic attenuation, mild LV dilatation.   LV Wall Motion:  Mod LV  dysfunction   ASSESSMENT AND PLAN:  1. Permanent atrial fibrillation: HR is in the 40s again today. EKG with chronic  Afib, HR 45-55bpm during today's visit - Will stop metoprolol tartrate and start metoprolol succinate 12.5mg  daily to improve HR - Continue diltiazem 240mg  daily for rate control, though this will be next choice for reduction if bradycardia persists - Continue xarelto for stroke ppx  2. Chronic combined CHF/Right-sided CHF: volume status is overall stable - no change in DOE or LE edema. He is still getting legs wrapped with wound care - Continue metoprolol succinate as above - Continue lasix 80mg  BID as above  3. HTN: BP 118/56 today - Continue metoprolol succinate as above - Continue diltiazem, lasix, and lisinopril  4. DM type 2: A1C 7.5 04/2021 - Continue metformin and insulin per PCP   Current medicines are reviewed at length with the patient today.  The patient does not have concerns regarding medicines.  The following changes have been made:  As above  Labs/ tests ordered today include:   Orders Placed This Encounter  Procedures   EKG 12-Lead     Disposition:   FU with Dr. in 4 months  Signed, 05/2021, PA-C  07/04/2021 6:26 PM

## 2021-07-04 NOTE — Progress Notes (Signed)
BIAGIO, SNELSON ([id number]) Visit Report for 07/04/2021 Arrival Information Details Patient Name: Date of Service: Kevin [name], [name] 07/04/2021 10:30 A M Medical Record Number: [id number] Patient Account Number: [id number] Date of Birth/Sex: Treating RN: 19-Aug-1951 (70 y.o. [name], [name] Primary Care Torry Adamczak: Nicoletta Ba Other Clinician: Referring Starsha Morning: Treating Ordean Fouts/Extender: Adele Dan in Treatment: 284 Visit Information History Since Last Visit Added or deleted any medications: No Patient Arrived: [name] Any new allergies or adverse reactions: No Arrival Time: 10:55 Had a fall or experienced change in No Accompanied By: self activities of daily living that may affect Transfer Assistance: None risk of falls: Patient Identification Verified: Yes Signs or symptoms of abuse/neglect since last visito No Secondary Verification Process Completed: Yes Hospitalized since last visit: No Patient Requires Transmission-Based Precautions: No Implantable device outside of the clinic excluding No Patient Has Alerts: Yes cellular tissue based products placed in the center since last visit: Has Dressing in Place as Prescribed: Yes Has Compression in Place as Prescribed: Yes Pain Present Now: No Electronic Signature(s) Signed: 07/04/2021 6:06:32 PM By: Shawn Stall Entered By: Shawn Stall on 07/04/2021 10:56:18 -------------------------------------------------------------------------------- Compression Therapy Details Patient Name: Date of Service: [name], [name] 07/04/2021 10:30 A M Medical Record Number: [id number] Patient Account Number: [id number] Date of Birth/Sex: Treating RN: 17-Nov-1950 (70 y.o. [name] Primary Care Londell Noll: Nicoletta Ba Other Clinician: Referring Purva Vessell: Treating Kaley Jutras/Extender: Adele Dan in Treatment: 284 Compression Therapy Performed for Wound Assessment: NonWound  Condition Lymphedema - Right Leg Performed By: Clinician Shawn Stall, RN Compression Type: Four Layer Electronic Signature(s) Signed: 07/04/2021 6:06:32 PM By: Shawn Stall Entered By: Shawn Stall on 07/04/2021 11:38:28 -------------------------------------------------------------------------------- Compression Therapy Details Patient Name: Date of Service: [name], [name] 07/04/2021 10:30 A M Medical Record Number: [id number] Patient Account Number: [id number] Date of Birth/Sex: Treating RN: 06/07/1951 (70 y.o. [name] Primary Care Paiton Boultinghouse: Nicoletta Ba Other Clinician: Referring Litha Lamartina: Treating Tiburcio Linder/Extender: Adele Dan in Treatment: 284 Compression Therapy Performed for Wound Assessment: NonWound Condition Lymphedema - Left Leg Performed By: Clinician Shawn Stall, RN Compression Type: Four Layer Electronic Signature(s) Signed: 07/04/2021 6:06:32 PM By: Shawn Stall Entered By: Shawn Stall on 07/04/2021 11:43:31 -------------------------------------------------------------------------------- Encounter Discharge Information Details Patient Name: Date of Service: [name], [name] 07/04/2021 10:30 A M Medical Record Number: [id number] Patient Account Number: [id number] Date of Birth/Sex: Treating RN: 11/03/1951 (70 y.o. [name] Primary Care Shereece Wellborn: Nicoletta Ba Other Clinician: Referring Tucker Steedley: Treating Lazariah Savard/Extender: Adele Dan in Treatment: [phone] Encounter Discharge Information Items Discharge Condition: Stable Ambulatory Status: Walker Discharge Destination: Home Transportation: Private Auto Accompanied By: self Schedule Follow-up Appointment: Yes Clinical Summary of Care: Electronic Signature(s) Signed: 07/04/2021 6:06:32 PM By: Shawn Stall Entered By: Shawn Stall on 07/04/2021  11:45:08 -------------------------------------------------------------------------------- Patient/Caregiver Education Details Patient Name: Date of Service: [name] 8/31/2022andnbsp10:30 A M Medical Record Number: [id number] Patient Account Number: [id number] Date of Birth/Gender: Treating RN: 1951/03/15 (70 y.o. [name] Primary Care Physician: Nicoletta Ba Other Clinician: Referring Physician: Treating Physician/Extender: Adele Dan in Treatment: [phone] Education Assessment Education Provided To: Patient Education Topics Provided Venous: Handouts: Managing Venous Disease and Related Ulcers Methods: Explain/Verbal Responses: Reinforcements needed Electronic Signature(s) Signed: 07/04/2021 6:06:32 PM By: Shawn Stall Entered By: Shawn Stall on 07/04/2021 11:44:53 -------------------------------------------------------------------------------- Wound Assessment Details Patient Name: Date of Service: [name], [name] 07/04/2021 10:30 A M Medical Record Number:  [id number] Patient Account Number: [id number] Date of Birth/Sex: Treating RN: [birth year] (70 y.o. [name] Primary Care Myah Guynes: Nicoletta Ba Other Clinician: Referring Mahrukh Seguin: Treating Chevis Weisensel/Extender: Adele Dan in Treatment: 284 Wound Status Wound Number: [id number] Primary Etiology: Diabetic Wound/Ulcer of the Lower Extremity Wound Location: Left T Second oe Wound Status: Open Wounding Event: Gradually Appeared Date Acquired: 01/31/2021 Weeks Of Treatment: 22 Clustered Wound: No Wound Measurements Length: (cm) 0.2 Width: (cm) 0.2 Depth: (cm) 0.1 Area: (cm) 0.031 Volume: (cm) 0.003 % Reduction in Area: 98.8% % Reduction in Volume: 98.9% Wound Description Classification: Grade 2 Exudate Amount: Medium Exudate Type: Serous Exudate Color: amber Treatment Notes Wound #193 (Toe Second) Wound Laterality: Left Cleanser Soap  and Water Discharge Instruction: May shower and wash wound with dial antibacterial soap and water prior to dressing change. Wound Cleanser Discharge Instruction: Cleanse the wound with wound cleanser prior to applying a clean dressing using gauze sponges, not tissue or cotton balls. Peri-Wound Care Topical Primary Dressing KerraCel Ag Gelling Fiber Dressing, 2x2 in (silver alginate) Discharge Instruction: Apply silver alginate to wound bed as instructed Secondary Dressing Woven Gauze Sponges 2x2 in Discharge Instruction: Apply over primary dressing as directed. Secured With Conforming Stretch Gauze Bandage, Sterile 2x75 (in/in) Discharge Instruction: Secure with stretch gauze as directed. Compression Wrap Compression Stockings Add-Ons Notes BLE lotion, unna boot first layer to upper portion of lower legs, 4 layer compression wraps. Electronic Signature(s) Signed: 07/04/2021 6:06:32 PM By: Shawn Stall Entered By: Shawn Stall on 07/04/2021 11:35:34 -------------------------------------------------------------------------------- Vitals Details Patient Name: Date of Service: [name], [name] 07/04/2021 10:30 A M Medical Record Number: [id number] Patient Account Number: [id number] Date of Birth/Sex: Treating RN: [birth year] (70 y.o. [name] Primary Care Cheryllynn Sarff: Nicoletta Ba Other Clinician: Referring Shawndale Kilpatrick: Treating Gailene Youkhana/Extender: Adele Dan in Treatment: 284 Vital Signs Time Taken: 10:55 Temperature (F): 98.3 Height (in): 70 Pulse (bpm): 80 Weight (lbs): 380.2 Respiratory Rate (breaths/min): 22 Body Mass Index (BMI): 54.5 Blood Pressure (mmHg): 181/75 Capillary Blood Glucose (mg/dl): 505 Reference Range: 80 - 120 mg / dl Notes patient forgot to take oral BP medication this morning, patient taking now. Electronic Signature(s) Signed: 07/04/2021 6:06:32 PM By: Shawn Stall Entered By: Shawn Stall on 07/04/2021 10:57:49

## 2021-07-04 NOTE — Patient Instructions (Signed)
Medication Instructions:  STOP METOPROLOL TART  START METOPROLOL SUCCINATE 12.5MG  DAILY (1/2 TAB)  *If you need a refill on your cardiac medications before your next appointment, please call your pharmacy*  Lab Work:   Testing/Procedures:  NONE    NONE  Special Instructions TAKE AND LOG YOUR BLOOD PRESSURE AND BRING WITH YOU TO YOUR FOLLOW UP APPOINTMENT  PLEASE READ AND FOLLOW LOW SALT DIET-ATTACHED  Follow-Up: Your next appointment:  4 month(s) In Person with K. Italy Hilty, MD   At [facility], you and your health needs are our priority.  As part of our continuing mission to provide you with exceptional heart care, we have created designated Provider Care Teams.  These Care Teams include your primary Cardiologist (physician) and Advanced Practice Providers (APPs -  Physician Assistants and Nurse Practitioners) who all work together to provide you with the care you need, when you need it.  DASH Eating Plan DASH stands for Dietary Approaches to Stop Hypertension. The DASH eating plan is a healthy eating plan that has been shown to: Reduce high blood pressure (hypertension). Reduce your risk for type 2 diabetes, heart disease, and stroke. Help with weight loss. What are tips for following this plan? Reading food labels Check food labels for the amount of salt (sodium) per serving. Choose foods with less than 5 percent of the Daily Value of sodium. Generally, foods with less than 300 milligrams (mg) of sodium per serving fit into this eating plan. To find whole grains, look for the word "whole" as the first word in the ingredient list. Shopping Buy products labeled as "low-sodium" or "no salt added." Buy fresh foods. Avoid canned foods and pre-made or frozen meals. Cooking Avoid adding salt when cooking. Use salt-free seasonings or herbs instead of table salt or sea salt. Check with your health care provider or pharmacist before using salt substitutes. Do not fry foods. Cook foods  using healthy methods such as baking, boiling, grilling, roasting, and broiling instead. Cook with heart-healthy oils, such as olive, canola, avocado, soybean, or sunflower oil. Meal planning  Eat a balanced diet that includes: 4 or more servings of fruits and 4 or more servings of vegetables each day. Try to fill one-half of your plate with fruits and vegetables. 6-8 servings of whole grains each day. Less than 6 oz (170 g) of lean meat, poultry, or fish each day. A 3-oz (85-g) serving of meat is about the same size as a deck of cards. One egg equals 1 oz (28 g). 2-3 servings of low-fat dairy each day. One serving is 1 cup (237 mL). 1 serving of nuts, seeds, or beans 5 times each week. 2-3 servings of heart-healthy fats. Healthy fats called omega-3 fatty acids are found in foods such as walnuts, flaxseeds, fortified milks, and eggs. These fats are also found in cold-water fish, such as sardines, salmon, and mackerel. Limit how much you eat of: Canned or prepackaged foods. Food that is high in trans fat, such as some fried foods. Food that is high in saturated fat, such as fatty meat. Desserts and other sweets, sugary drinks, and other foods with added sugar. Full-fat dairy products. Do not salt foods before eating. Do not eat more than 4 egg yolks a week. Try to eat at least 2 vegetarian meals a week. Eat more home-cooked food and less restaurant, buffet, and fast food. Lifestyle When eating at a restaurant, ask that your food be prepared with less salt or no salt, if possible. If  you drink alcohol: Limit how much you use to: 0-1 drink a day for women who are not pregnant. 0-2 drinks a day for men. Be aware of how much alcohol is in your drink. In the [place] one drink equals one 12 oz bottle of beer (355 mL), one 5 oz glass of wine (148 mL), or one 1 oz glass of hard liquor (44 mL). General information Avoid eating more than 2,300 mg of salt a day. If you have hypertension, you may need  to reduce your sodium intake to 1,500 mg a day. Work with your health care provider to maintain a healthy body weight or to lose weight. Ask what an ideal weight is for you. Get at least 30 minutes of exercise that causes your heart to beat faster (aerobic exercise) most days of the week. Activities may include walking, swimming, or biking. Work with your health care provider or dietitian to adjust your eating plan to your individual calorie needs. What foods should I eat? Fruits All fresh, dried, or frozen fruit. Canned fruit in natural juice (without added sugar). Vegetables Fresh or frozen vegetables (raw, steamed, roasted, or grilled). Low-sodium or reduced-sodium tomato and vegetable juice. Low-sodium or reduced-sodium tomato sauce and tomato paste. Low-sodium or reduced-sodium canned vegetables. Grains Whole-grain or whole-wheat bread. Whole-grain or whole-wheat pasta. Brown rice. Orpah Cobb. Bulgur. Whole-grain and low-sodium cereals. Pita bread. Low-fat, low-sodium crackers. Whole-wheat flour tortillas. Meats and other proteins Skinless chicken or [place]. Ground chicken or [place]. Pork with fat trimmed off. Fish and seafood. Egg whites. Dried beans, peas, or lentils. Unsalted nuts, nut butters, and seeds. Unsalted canned beans. Lean cuts of beef with fat trimmed off. Low-sodium, lean precooked or cured meat, such as sausages or meat loaves. Dairy Low-fat (1%) or fat-free (skim) milk. Reduced-fat, low-fat, or fat-free cheeses. Nonfat, low-sodium ricotta or cottage cheese. Low-fat or nonfat yogurt. Low-fat, low-sodium cheese. Fats and oils Soft margarine without trans fats. Vegetable oil. Reduced-fat, low-fat, or light mayonnaise and salad dressings (reduced-sodium). Canola, safflower, olive, avocado, soybean, and sunflower oils. Avocado. Seasonings and condiments Herbs. Spices. Seasoning mixes without salt. Other foods Unsalted popcorn and pretzels. Fat-free sweets. The items listed  above may not be a complete list of foods and beverages you can eat. Contact a dietitian for more information. What foods should I avoid? Fruits Canned fruit in a light or heavy syrup. Fried fruit. Fruit in cream or butter sauce. Vegetables Creamed or fried vegetables. Vegetables in a cheese sauce. Regular canned vegetables (not low-sodium or reduced-sodium). Regular canned tomato sauce and paste (not low-sodium or reduced-sodium). Regular tomato and vegetable juice (not low-sodium or reduced-sodium). Rosita Fire. Olives. Grains Baked goods made with fat, such as croissants, muffins, or some breads. Dry pasta or rice meal packs. Meats and other proteins Fatty cuts of meat. Ribs. Fried meat. Tomasa Blase. Bologna, salami, and other precooked or cured meats, such as sausages or meat loaves. Fat from the back of a pig (fatback). Bratwurst. Salted nuts and seeds. Canned beans with added salt. Canned or smoked fish. Whole eggs or egg yolks. Chicken or [place] with skin. Dairy Whole or 2% milk, cream, and half-and-half. Whole or full-fat cream cheese. Whole-fat or sweetened yogurt. Full-fat cheese. Nondairy creamers. Whipped toppings. Processed cheese and cheese spreads. Fats and oils Butter. Stick margarine. Lard. Shortening. Ghee. Bacon fat. Tropical oils, such as coconut, palm kernel, or palm oil. Seasonings and condiments Onion salt, garlic salt, seasoned salt, table salt, and sea salt. Worcestershire sauce. Tartar sauce. Barbecue sauce. Teriyaki  sauce. Soy sauce, including reduced-sodium. Steak sauce. Canned and packaged gravies. Fish sauce. Oyster sauce. Cocktail sauce. Store-bought horseradish. Ketchup. Mustard. Meat flavorings and tenderizers. Bouillon cubes. Hot sauces. Pre-made or packaged marinades. Pre-made or packaged taco seasonings. Relishes. Regular salad dressings. Other foods Salted popcorn and pretzels. The items listed above may not be a complete list of foods and beverages you should avoid.  Contact a dietitian for more information. Where to find more information National Heart, Lung, and Blood Institute: PopSteam.is American Heart Association: www.heart.org Academy of Nutrition and Dietetics: www.eatright.org National Kidney Foundation: www.kidney.org Summary The DASH eating plan is a healthy eating plan that has been shown to reduce high blood pressure (hypertension). It may also reduce your risk for type 2 diabetes, heart disease, and stroke. When on the DASH eating plan, aim to eat more fresh fruits and vegetables, whole grains, lean proteins, low-fat dairy, and heart-healthy fats. With the DASH eating plan, you should limit salt (sodium) intake to 2,300 mg a day. If you have hypertension, you may need to reduce your sodium intake to 1,500 mg a day. Work with your health care provider or dietitian to adjust your eating plan to your individual calorie needs. This information is not intended to replace advice given to you by your health care provider. Make sure you discuss any questions you have with your health care provider. Document Revised: 09/24/2019 Document Reviewed: 09/24/2019 Elsevier Patient Education  2022 [employer].

## 2021-07-11 ENCOUNTER — Encounter (HOSPITAL_BASED_OUTPATIENT_CLINIC_OR_DEPARTMENT_OTHER): Payer: Medicare Other | Attending: Physician Assistant | Admitting: Physician Assistant

## 2021-07-11 ENCOUNTER — Other Ambulatory Visit: Payer: Self-pay

## 2021-07-11 ENCOUNTER — Ambulatory Visit (INDEPENDENT_AMBULATORY_CARE_PROVIDER_SITE_OTHER): Payer: Medicare Other | Admitting: Family Medicine

## 2021-07-11 ENCOUNTER — Encounter: Payer: Self-pay | Admitting: Family Medicine

## 2021-07-11 ENCOUNTER — Telehealth: Payer: Self-pay

## 2021-07-11 VITALS — BP 137/69 | HR 61 | Temp 97.7°F | Wt 374.0 lb

## 2021-07-11 DIAGNOSIS — N183 Chronic kidney disease, stage 3 unspecified: Secondary | ICD-10-CM | POA: Diagnosis not present

## 2021-07-11 DIAGNOSIS — Z794 Long term (current) use of insulin: Secondary | ICD-10-CM | POA: Diagnosis not present

## 2021-07-11 DIAGNOSIS — N1832 Chronic kidney disease, stage 3b: Secondary | ICD-10-CM | POA: Diagnosis not present

## 2021-07-11 DIAGNOSIS — E1122 Type 2 diabetes mellitus with diabetic chronic kidney disease: Secondary | ICD-10-CM

## 2021-07-11 DIAGNOSIS — I87333 Chronic venous hypertension (idiopathic) with ulcer and inflammation of bilateral lower extremity: Secondary | ICD-10-CM | POA: Insufficient documentation

## 2021-07-11 DIAGNOSIS — L97521 Non-pressure chronic ulcer of other 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 of skin: Secondary | ICD-10-CM | POA: Insufficient documentation

## 2021-07-11 DIAGNOSIS — E1140 Type 2 diabetes mellitus with diabetic neuropathy, unspecified: Secondary | ICD-10-CM | POA: Diagnosis not present

## 2021-07-11 DIAGNOSIS — L97519 Non-pressure chronic ulcer of other part of right foot with unspecified severity: Secondary | ICD-10-CM | POA: Diagnosis not present

## 2021-07-11 DIAGNOSIS — E11621 Type 2 diabetes mellitus with foot ulcer: Secondary | ICD-10-CM | POA: Diagnosis not present

## 2021-07-11 DIAGNOSIS — L97829 Non-pressure chronic ulcer of other part of left lower leg with unspecified severity: Secondary | ICD-10-CM | POA: Diagnosis not present

## 2021-07-11 DIAGNOSIS — Z125 Encounter for screening for malignant neoplasm of prostate: Secondary | ICD-10-CM | POA: Diagnosis not present

## 2021-07-11 DIAGNOSIS — I87332 Chronic venous hypertension (idiopathic) with ulcer and inflammation of left lower extremity: Secondary | ICD-10-CM | POA: Diagnosis not present

## 2021-07-11 DIAGNOSIS — Z23 Encounter for immunization: Secondary | ICD-10-CM

## 2021-07-11 DIAGNOSIS — R001 Bradycardia, unspecified: Secondary | ICD-10-CM

## 2021-07-11 DIAGNOSIS — I482 Chronic atrial fibrillation, unspecified: Secondary | ICD-10-CM

## 2021-07-11 DIAGNOSIS — L97522 Non-pressure chronic ulcer of other part of left foot with fat layer exposed: Secondary | ICD-10-CM | POA: Diagnosis not present

## 2021-07-11 DIAGNOSIS — I10 Essential (primary) hypertension: Secondary | ICD-10-CM | POA: Diagnosis not present

## 2021-07-11 DIAGNOSIS — I890 Lymphedema, not elsewhere classified: Secondary | ICD-10-CM | POA: Insufficient documentation

## 2021-07-11 DIAGNOSIS — E11622 Type 2 diabetes mellitus with other skin ulcer: Secondary | ICD-10-CM | POA: Diagnosis not present

## 2021-07-11 DIAGNOSIS — Z7901 Long term (current) use of anticoagulants: Secondary | ICD-10-CM

## 2021-07-11 LAB — BASIC METABOLIC PANEL
BUN: 25 mg/dL — ABNORMAL HIGH (ref 6–23)
CO2: 27 mEq/L (ref 19–32)
Calcium: 8.6 mg/dL (ref 8.4–10.5)
Chloride: 99 mEq/L (ref 96–112)
Creatinine, Ser: 1.51 mg/dL — ABNORMAL HIGH (ref 0.40–1.50)
GFR: 46.58 mL/min — ABNORMAL LOW (ref >60.00)
Glucose, Bld: 147 mg/dL — ABNORMAL HIGH (ref 70–99)
Potassium: 4.4 mEq/L (ref 3.5–5.1)
Sodium: 138 mEq/L (ref 135–145)

## 2021-07-11 LAB — HEMOGLOBIN A1C: Hgb A1c MFr Bld: 7.9 % — ABNORMAL HIGH (ref 4.6–6.5)

## 2021-07-11 LAB — PSA: PSA: 0.39 ng/mL (ref 0.10–4.00)

## 2021-07-11 MED ORDER — CLONIDINE HCL 0.1 MG PO TABS: ORAL_TABLET | ORAL | 3 refills | Status: DC

## 2021-07-11 NOTE — Progress Notes (Signed)
OFFICE VISIT  07/11/2021  CC:  Chief Complaint  Patient presents with   Follow-up    3 month [name]. Pt is fasting    HPI:    Patient is a 70 y.o. Caucasian male who presents for 3 mo f/u DM, HTN, CRI III, and chronic a-fib. He has chronic bilat LE lymphedema with recurrent stasis ulcers and cellulitis-->gets ongoing care at wound care clinic, monthly pen G prophylaxis here as per ID recommendations.  DM: glucoses vary per pt, no real numbers recalled except one. Fasting 187 this morning, took 25 U 75/25.  HTN: Says he saw a PA at Dr. Blanchie Dessert office recently and was switched from lopressor to toprol 1/2 of 25mg  qd d/t bradycardia-->HR mid 60s since then but bp consistently higher at 200/90s avg.    CRI III: avoids nsaids.   PMP AWARE reviewed today: most recent rx for vicodin was filled 06/25/21, # 60, rx by me. Most recent fioricet rx filled 01/05/21, #60, rx by me. No red flags.  Past Medical History:  Diagnosis Date   ALLERGIC RHINITIS 08/11/2006   ASTHMA 08/11/2006   Chronic atrial fibrillation (HCC) 08/2014   Chronic combined systolic and diastolic CHF (congestive heart failure) [facility])    Cardiology f/u 12/2017: pt volume overloaded (R heart dysf suspected), BNP very high, lasix increased.  Repeat echo 12/2017: normal LV EF, mild DD, +RV syst dysfxn, mod pulm HTN, biatrial enlgmt.   Chronic constipation    Chronic renal insufficiency, stage 2 (mild)    Borderline stage III (GFR 60s).   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  Plan repeat CBC at f/u summer 2019 and if decreased from baseline will check hemoccults and iron labs again.   OBESITY, MORBID 12/14/2007    saxenda started 05/2020 by [facility] wt mgmt center   OSA (obstructive sleep apnea)    to get sleep study with [facility]) as of 12/03/2018 consult.   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  [id number] ID started him on amoxil prophylaxis---ineffective.  End 2018/Jan 2019 penicillin G injections prophyl helpful but pt declined to continue this as of 01/2018 ID f/u.  ID talked him into resuming monthly penicillin G as of 02/2018 f/u.     Restless leg syndrome    Rx'd clonazepam 09/2017 and pt refused to take it after reading the medication's potential side effects.   Venous stasis ulcers of both lower extremities (HCC)    Severe lymphedema.  wound clinic care ongoing as of 01/2018    Past Surgical History:  Procedure Laterality Date   Carotid dopplers  07/23/2018   Left NORMAL.  Right 1-39% ICA stenosis, with <50% distal CCA stenosis (not hemodynamically significant)   TRANSTHORACIC ECHOCARDIOGRAM  11/2007; 09/2014; 11/2015;12/2017   LV fxn normal, EF normal, mild dilation of left atrium.  2015 grade II diast dysfxn.  2017 EF 55-60%. 2019: LVEF 60-65%, mild RV syst dysf,biatrial enlgmt, mod pulm htn.   URETERAL STENT PLACEMENT     virtual colonoscopy  01/2011   Normal    Outpatient Medications Prior to Visit  Medication Sig Dispense Refill  arginine 500 MG tablet Take by mouth.     b complex vitamins capsule Take 1 capsule by mouth every morning.     butalbital-acetaminophen-caffeine (FIORICET WITH CODEINE) 50-325-40-30 MG capsule TAKE 1 TO 2 CAPSULES BY  MOUTH EVERY 6 HOURS AS  NEEDED FOR HEADACHE(S)  MANUFACTURER RECOMMENDS NOT EXCEEDING 6 CAPSULES/DAY 60 capsule 0   cetirizine (ZYRTEC) 10 MG tablet Take 10 mg by mouth daily.     CHROMIUM GTF PO Take by mouth daily.     Coenzyme Q10 (CO Q 10) 10 MG CAPS Take by mouth. Reported on 02/19/2016     diltiazem (TIAZAC) 240 MG 24 hr capsule TAKE 1 CAPSULE BY MOUTH  DAILY 90  capsule 1   furosemide (LASIX) 40 MG tablet TAKE 2 TABLETS BY MOUTH TWO TIMES DAILY (Patient taking differently: Take 80 mg in the mornings and 40 mg in the evenings) 360 tablet 3   Glutamine 500 MG CAPS Take by mouth.     HYDROcodone-acetaminophen (NORCO/VICODIN) 5-325 MG tablet 1-2 tabs po bid prn pain 60 tablet 0   Insulin Lispro Prot & Lispro (HUMALOG MIX 75/25 KWIKPEN) (75-25) 100 UNIT/ML Kwikpen INJECT SUBCUTANEOUSLY 25  UNITS EVERY MORNING AND 20  UNITS EVERY EVENING AT  SUPPER 45 mL 3   Insulin Pen Needle (B-D ULTRAFINE III SHORT PEN) 31G X 8 MM MISC USE 1 PENNEEDLE TWICE DAILY 200 each 1   lisinopril (ZESTRIL) 40 MG tablet TAKE 1 TABLET BY MOUTH  DAILY 90 tablet 3   metFORMIN (GLUCOPHAGE) 1000 MG tablet TAKE 1 TABLET BY MOUTH  TWICE DAILY WITH MEALS 180 tablet 1   metoprolol succinate (TOPROL XL) 25 MG 24 hr tablet Take 0.5 tablets (12.5 mg total) by mouth daily. 15 tablet 6   Multiple Vitamin (MULTIVITAMIN) tablet Take 1 tablet by mouth daily.     ONETOUCH ULTRA test strip CHECK BLOOD SUGAR TWICE  DAILY 200 strip 3   rivaroxaban (XARELTO) 20 MG TABS tablet TAKE 1 TABLET BY MOUTH ONCE DAILY WITH SUPPER 90 tablet 3   terazosin (HYTRIN) 10 MG capsule TAKE 1 CAPSULE BY MOUTH  ONCE DAILY AT BEDTIME 90 capsule 3   vitamin E 400 UNIT capsule Take 400 Units by mouth every morning. Selenium 50mg      Facility-Administered Medications Prior to Visit  Medication Dose Route Frequency Provider Last Rate Last Admin   penicillin g benzathine (BICILLIN LA) 1200000 UNIT/2ML injection 600,000 Units  600,000 Units Intramuscular Q30 days , MD   600,000 Units at 04/12/20 0933   penicillin g benzathine (BICILLIN LA) 1200000 UNIT/2ML injection 600,000 Units  600,000 Units Intramuscular Q30 days 06/12/20, MD   600,000 Units at 04/04/21 0814   penicillin g benzathine (BICILLIN LA) 1200000 UNIT/2ML injection 600,000 Units  600,000 Units Intramuscular Q30 days 06/04/21, MD    600,000 Units at 06/13/21 1148   penicillin g benzathine (BICILLIN LA) 1200000 UNIT/2ML injection 600,000 Units  600,000 Units Intramuscular Q30 days 08/13/21, MD   600,000 Units at 06/13/21 1146    Allergies  Allergen Reactions   Other Other (See Comments)    Sneezing, coughing   Hydralazine Other (See Comments)    Drowsiness/sedation/mental fog    ROS As per HPI  PE: Vitals with BMI 07/11/2021 07/04/2021 05/02/2021  Height - 5\' 10"  5\' 9"   Weight 374 lbs 378 lbs 375 lbs  BMI 53.66 54.24 55.35  Systolic 137 118 05/04/2021  Diastolic 69 56 56  Pulse 61 45 66  Gen: Alert, well appearing.  Patient is oriented to person, place, time, and situation. AFFECT: pleasant, lucid thought and speech. CV: Irreg irreg, rate approx 60-65, no murmur/rub/gallop. Chest is clear, no wheezing or rales. Normal symmetric air entry throughout both lung fields. No chest wall deformities or tenderness. EXT: LLs in unna boots.  LABS:  Lab Results  Component Value Date   TSH 4.40 06/08/2018   Lab Results  Component Value Date   WBC 8.1 04/04/2021   HGB 13.4 04/04/2021   HCT 43.6 04/04/2021   MCV 84.8 04/04/2021   PLT 236 04/04/2021   Lab Results  Component Value Date   CREATININE 1.51 (H) 05/09/2021   BUN 26 (H) 05/09/2021   NA 138 05/09/2021   K 4.4 05/09/2021   CL 100 05/09/2021   CO2 28 05/09/2021   Lab Results  Component Value Date   ALT 11 04/04/2021   AST 15 04/04/2021   ALKPHOS 70 07/12/2020   BILITOT 1.0 04/04/2021   Lab Results  Component Value Date   CHOL 101 04/04/2021   Lab Results  Component Value Date   HDL 41 04/04/2021   Lab Results  Component Value Date   LDLCALC 46 04/04/2021   Lab Results  Component Value Date   TRIG 61 04/04/2021   Lab Results  Component Value Date   CHOLHDL 2.5 04/04/2021   Lab Results  Component Value Date   PSA 0.35 07/12/2020   PSA 0.71 07/06/2019   PSA 0.26 03/09/2018   Lab Results  Component Value Date   HGBA1C 7.5  (H) 04/04/2021   IMPRESSION AND PLAN:  1) HTN, uncontrolled.  Worse control since change from lopressor 12.5 bid to toprol xl 12.5 qd, but HR consistently better->60s now as opposed to 40s.   Severely limited choices at this point regarding bp med additions-->aldactone was assoc with hyperkalemia, he did not tolerate hydralazine.  Avoiding thiazide d/t renal insufficiency plus pt already on lasix 80 bid.  He is maxed out on lisinopril.  Options add amlodipine but worry about this worsening his LE edema potentially. Clonidine is what I chose to go with today: start low and watch HR closely; 0.1mg  tid, ok to give extra 0.1mg  prn SBP>180 or DBP > 100.  2) DM; no signif home gluc info. He varies his insulin dosing a lot. Cont metformin 1000mg  bid. Hba1c and lytes/cr today.  3) CRI IIIb: avoids NSAIDs. Lytes/cr today.  4) Prostate ca screening: PSA due at this time->ordered.  An After Visit Summary was printed and given to the patient.  FOLLOW UP: Return in about 1 week (around 07/18/2021) for routine chronic illness f/u. Next cpe 04/2022  Signed:  05/2022, MD           07/11/2021

## 2021-07-11 NOTE — Telephone Encounter (Signed)
Spoke with patient regarding results/recommendations.  

## 2021-07-11 NOTE — Telephone Encounter (Signed)
You are correct->he should continue taking the metoprolol.
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
## 2021-07-11 NOTE — Telephone Encounter (Signed)
Pt called to ask if he is adding the new medication with the metoprolol or if he is to stop taking metoprolol. Based off office note, advised pt to continue metoprolol.   Please advise of any changes that may need to be told to pt.

## 2021-07-11 NOTE — Progress Notes (Addendum)
GIANI, BETZOLD ([id number]) Visit Report for 07/11/2021 Chief Complaint Document Details Patient Name: Date of Service: CO MARSHELL, DILAURO 07/11/2021 10:30 A M Medical Record Number: [id number] Patient Account Number: [id number] Date of Birth/Sex: Treating RN: 01-16-1951 (70 y.o. [name] Primary Care Provider: Nicoletta Ba Other Clinician: Referring Provider: Treating Provider/Extender: Adele Dan in Treatment: 285 Information Obtained from: Patient Chief Complaint patient is here for evaluation of venous/lymphedema ulcers Electronic Signature(s) Signed: 07/11/2021 10:40:31 AM By: Lenda Kelp PA-C Entered By: Lenda Kelp on 07/11/2021 10:40:31 -------------------------------------------------------------------------------- HPI Details Patient Name: Date of Service: [name], Jamont J. 07/11/2021 10:30 A M Medical Record Number: [id number] Patient Account Number: [id number] Date of Birth/Sex: Treating RN: [birth year] (70 y.o. [name] Primary Care Provider: Nicoletta Ba Other Clinician: Referring Provider: Treating Provider/Extender: Adele Dan in Treatment: 285 History of Present Illness HPI Description: Referred by PCP for consultation. Patient has long standing history of BLE venous stasis, no prior ulcerations. At beginning of month, developed cellulitis and weeping. Received IM Rocephin followed by Keflex and resolved. Wears compression stocking, appr 6 months old. Not sure strength. No present drainage. 01/22/16 this is a patient who is a type II diabetic on insulin. 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 He tells me he religiously wears pressure stockings of uncertain strength. He was here with weeping edema about 8 months ago but did not have an open wound. Roughly a month ago he had a reopening on his bilateral legs. He is been using bandages and Neosporin. He does  not complain of pain. 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 Last BUN/creatinine I have is from 11/20/15 at 13 and 1.0 respectively 01/29/16; patient arrives today having tolerated the Profore wrap. He brought in his stockings and these are 18 mmHg stockings he bought from [employer]. The compression here is likely inadequate. He does not complain of pain or excessive drainage she has no systemic symptoms. The wound on the right looks improved as does the one on the left although one on the left is more substantial with still tissue at risk below the actual wound area on the bilateral posterior calf 02/05/16; patient arrives with poor edema control. He states that we did put a 4 layer compression on it last week. No weight appear 5 this. 02/12/16; the area on the posterior right Has healed. The left Has a substantial wound that has necrotic surface eschar that requires a debridement with a curette. 02/16/16;the patient called or a Nurse visit secondary to increased swelling. He had been in earlier in the week with his right leg healed. He was transitioned to is on pressure stocking on the right leg with the only open wound on the left, a substantial area on the left posterior calf. Note he has a history of severe lower extremity edema, he has a history of chronic atrial fibrillation but not heart failure per my notes but I'll need to research this. He is not complaining of chest pain shortness of breath or orthopnea. The intake nurse noted blisters on the previously closed right leg 02/19/16; this is the patient's regular visit day. I see him on Friday with escalating edema new wounds on the right leg and clear signs of at least right ventricular heart failure. I increased his Lasix to 40 twice a  day. He is returning currently in follow-up. States he is noticed a decrease in that the edema 02/26/16  patient's legs have much less edema. There is nothing really open on the right leg. The left leg has improved condition of the large superficial wound on the posterior left leg 03/04/16; edema control is very much better. The patient's right leg wounds have healed. On the left leg he continues to have severe venous inflammation on the posterior aspect of the left leg. There is no tenderness and I don't think any of this is cellulitis. 03/11/16; patient's right leg is married healed and he is in his own stocking. The patient's left leg has deteriorated somewhat. There is a lot of erythema around the wound on the posterior left leg. There is also a significant rim of erythema posteriorly just above where the wrap would've ended there is a new wound in this location and a lot of tenderness. Can't rule out cellulitis in this area. 03/15/16; patient's right leg remains healed and he is in his own stocking. The patient's left leg is much better than last review. His major wound on the posterior aspect of his left Is almost fully epithelialized. He has 3 small injuries from the wraps. Really. Erythema seems a lot better on antibiotics 03/18/16; right leg remains healed and he is in his own stocking. The patient's left leg is much better. The area on the posterior aspect of the left calf is fully epithelialized. His 3 small injuries which were wrap injuries on the left are improved only one seems still open his erythema has resolved 03/25/16; patient's right leg remains healed and he is in his own stocking. There is no open area today on the left leg posterior leg is completely closed up. His wrap injuries at the superior aspect of his leg are also resolved. He looks as though he has some irritation on the dorsal ankle but this is fully epithelialized without evidence of infection. 03/28/16; we discharged this patient on Monday. Transitioned him into his own stocking. There were problems almost immediately with  uncontrolled swelling weeping edema multiple some of which have opened. He does not feel systemically unwell in particular no chest pain no shortness of breath and he does not feel 04/08/16; the edema is under better control with the Profore light wrap but he still has pitting edema. There is one large wound anteriorly 2 on the medial aspect of his left leg and 3 small areas on the superior posterior calf. Drainage is not excessive he is tolerating a Profore light well 04/15/16; put a Profore wrap on him last week. This is controlled is edema however he had a lot of pain on his left anterior foot most of his wounds are healed 04/22/16 once again the patient has denuded areas on the left anterior foot which he states are because his wrap slips up word. He saw his primary physician today is on Lasix 40 twice a day and states that he his weight is down 20 pounds over the last 3 months. 04/29/16: Much improved. left anterior foot much improved. He is now on Lasix 80 mg per day. Much improved edema control 05/06/16; I was hoping to be able to discharge him today however once again he has blisters at a low level of where the compression was placed last week mostly on his left lateral but also his left medial leg and a small area on the anterior part of the left foot. 05/09/16; apparently the patient  went home after his appointment on 7/4 later in the evening developing pain in his upper medial thigh together with subjective fever and chills although his temperature was not taken. The pain was so intense he felt he would probably have to call 911. However he then remembered that he had leftover doxycycline from a previous round of antibiotics and took these. By the next morning he felt a lot better. He called and spoke to one of our nurses and I approved doxycycline over the phone thinking that this was in relation to the wounds we had previously seen although they were definitely were not. The patient feels a lot  better old fever no chills he is still working. Blood sugars are reasonably controlled 05/13/16; patient is back in for review of his cellulitis on his anterior medial upper thigh. He is taking doxycycline this is a lot better. Culture I did of the nodular area on the dorsal aspect of his foot grew MRSA this also looks a lot better. 05/20/16; the patient is cellulitis on the medial upper thigh has resolved. All of his wound areas including the left anterior foot, areas on the medial aspect of the left calf and the lateral aspect of the calf at all resolved. He has a new blister on the left dorsal foot at the level of the fourth toe this was excised. No evidence of infection 05/27/16; patient continues to complain weeping edema. He has new blisterlike wounds on the left anterior lateral and posterior lateral calf at the top of his wrap levels. The area on his left anterior foot appears better. He is not complaining of fever, pain or pruritus in his feet. 05/30/16; the patient's blisters on his left anterior leg posterior calf all look improved. He did not increase the Lasix 100 mg as I suggested because he was going to run out of his 40 mg tablets. He is still having weeping edema of his toes 06/03/16; I renewed his Lasix at 80 mg once a day as he was about to run out when I last saw him. He is on 80 mg of Lasix now. I have asked him to cut down on the excessive amount of water he was drinking and asked him to drink according to his thirst mechanisms 06/12/2016 -- was seen 2 days ago and was supposed to wear his compression stockings at home but he is developed lymphedema and superficial blisters on the left lower extremity and hence came in for a review 06/24/16; the remaining wound is on his left anterior leg. He still has edema coming from between his toes. There is lymphedema here however his edema is generally better than when I last saw this. He has a history of atrial fibrillation but does not have a  known history of congestive heart failure nevertheless I think he probably has this at least on a diastolic basis. 07/01/16 I reviewed his echocardiogram from January 2017. This was essentially normal. He did not have LVH, EF of 55-60%. His right ventricular function was normal although he did have trivial tricuspid and pulmonic regurgitation. This is not audible on exam however. I increased his Lasix to do massive edema in his legs well above his knees I think in early July. He was also drinking an excessive amount of water at the time. 07/15/16; missed his appointment last week because of the Labor Day holiday on Monday. He could not get another appointment later in the week. Started to feel the wrap digging in superiorly  so we remove the top half and the bottom half of his wrap. He has extensive erythema and blistering superiorly in the left leg. Very tender. Very swollen. Edema in his foot with leaking edema fluid. He has not been systemically unwell 07/22/16; the area on the left leg laterally required some debridement. The medial wounds look more stable. His wrap injury wounds appear to have healed. Edema and his foot is better, weeping edema is also better. He tells me he is meeting with the supplier of the external compression pumps at work 08/05/16; the patient was on vacation last week in [facility]. His wrap is been on for an extended period of time. Also over the weekend he developed an extensive area of tender erythema across his anterior medial thigh. He took to doxycycline yesterday that he had leftover from a previous prescription. The patient complains of weeping edema coming out of his toes 08/08/16; I saw this patient on 10/2. He was tender across his anterior thigh. I put him on doxycycline. He returns today in follow-up. He does not have any open wounds on his lower leg, he still has edema weeping into his toes. 08/12/16; patient was seen back urgently today to follow-up for his  extensive left thigh cellulitis/erysipelas. He comes back with a lot less swelling and erythema pain is much better. I believe I gave him Augmentin and Cipro. His wrap was cut down as he stated a roll down his legs. He developed blistering above the level of the wrap that remained. He has 2 open blisters and 1 intact. 08/19/16; patient is been doing his primary doctor who is increased his Lasix from 40-80 once a day or 80 already has less edema. Cellulitis has remained improved in the left thigh. 2 open areas on the posterior left calf 08/26/16; he returns today having new open blisters on the anterior part of his left leg. He has his compression pumps but is not yet been shown how to use some vital representative from the supplier. 09/02/16 patient returns today with no open wounds on the left leg. Some maceration in his plantar toes 09/10/2016 -- Dr. Leanord Hawking had recently discharged him on 09/02/2016 and he has come right back with redness swelling and some open ulcers on his left lower extremity. He says this was caused by trying to apply his compression stockings and he's been unable to use this and has not been able to use his lymphedema pumps. He had some doxycycline leftover and he has started on this a few days ago. 09/16/16; there are no open wounds on his leg on the left and no evidence of cellulitis. 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 He does not complain of symptoms here. I am not clear use using his external compression pumps. 09/23/16; I have not seen this patient in 2 weeks. He canceled his appointment 10 days ago as he was going on vacation. He tells me that on Monday he noticed a large area on his posterior left leg which is been draining copiously and is reopened into a large wound. He is been using ABDs and the external part of his juxtalite, according to our nurse this was not on properly. 10/07/16; Still a substantial  area on the posterior left leg. Using silver alginate 10/14/16; in general better although there is still open area which looks healthy. Still using silver alginate. He reminds me that this happen before he left for [facility]  [place]. T oday while he was showering in the morning. He had been using his juxtalite's 10/21/16; the area on his posterior left leg is fully epithelialized. However he arrives today with a large area of tender erythema in his medial and posterior left thigh just above the knee. I have marked the area. Once again he is reluctant to consider hospitalization. I treated him with oral antibiotics in the past for a similar situation with resolution I think with doxycycline however this area it seems more extensive to me. He is not complaining of fever but does have chills and says states he is thirsty. His blood sugar today was in the 140s at home 10/25/16 the area on his posterior left leg is fully epithelialized although there is still some weeping edema. The large area of tenderness and erythema in his medial and posterior left thigh is a lot less tender although there is still a lot of swelling in this thigh. He states he feels a lot better. He is on doxycycline and Augmentin that I started last week. This will continued until Tuesday, December 26. I have ordered a duplex ultrasound of the left thigh rule out DVT whether there is an abscess something that would need to be drained I would also like to know. 11/01/16; he still has weeping edema from a not fully epithelialized area on his left posterior calf. Most of the rest of this looks a lot better. He has completed his antibiotics. His thigh is a lot better. Duplex ultrasound did not show a DVT in the thigh 11/08/16; he comes in today with more Denuded surface epithelium from the posterior aspect of his calf. There is no real evidence of cellulitis. The superior aspect of his wrap appears to have put quite an indentation in his leg  just below the knee and this may have contributed. He does not complain of pain or fever. We have been using silver alginate as the primary dressing. The area of cellulitis in the right thigh has totally resolved. He has been using his compression stockings once a week 11/15/16; the patient arrives today with more loss of epithelium from the posterior aspect of his left calf. He now has a fairly substantial wound in this area. The reason behind this deterioration isn't exactly clear although his edema is not well controlled. He states he feels he is generally more swollen systemically. He is not complaining of chest pain shortness of breath fever. T me he has an appointment with his primary physician in early February. He is on 80 mg of oral ells Lasix a day. He claims compliance with the external compression pumps. He is not having any pain in his legs similar to what he has with his recurrent cellulitis 11/22/16; the patient arrives a follow-up of his large area on his left lateral calf. This looks somewhat better today. He came in earlier in the week for a dressing change since I saw him a week ago. He is not complaining of any pain no shortness of breath no chest pain 11/28/16; the patient arrives for follow-up of his large area on the left lateral calf this does not look better. In fact it is larger weeping edema. The surface of the wound does not look too bad. We have been using silver alginate although I'm not certain that this is a dressing issue. 12/05/16; again the patient follows up for a large wound on the left lateral and left posterior calf this does not look better. There  continues to be weeping edema necrotic surface tissue. More worrisome than this once again there is erythema below the wound involving the distal Achilles and heel suggestive of cellulitis. He is on his feet working most of the day of this is not going well. We are changing his dressing twice a week to facilitate the  drainage. 12/12/16; not much change in the overall dimensions of the large area on the left posterior calf. This is very inflamed looking. I gave him an. Doxycycline last week does not really seem to have helped. He found the wrap very painful indeed it seems to of dog into his legs superiorly and perhaps around the heel. He came in early today because the drainage had soaked through his dressings. 12/19/16- patient arrives for follow-up evaluation of his left lower extremity ulcers. He states that he is using his lymphedema pumps once daily when there is "no drainage". He admits to not using his lipedema pumps while under current treatment. His blood sugars have been consistently between 150-200. 12/26/16; the patient is not using his compression pumps at home because of the wetness on his feet. I've advised him that I think it's important for him to use this daily. He finds his feet too wet, he can put a plastic bag over his legs while he is in the pumps. Otherwise I think will be in a vicious circle. We are using silver alginate to the major area on his left posterior calf 01/02/17; the patient's posterior left leg has further of all into 3 open wounds. All of them covered with a necrotic surface. He claims to be using his compression pumps once a day. His edema control is marginal. Continue with silver alginate 01/10/17; the patient's left posterior leg actually looks somewhat better. There is less edema, less erythema. Still has 3 open areas covered with a necrotic surface requiring debridement. He claims to be using his compression pumps once a day his edema control is better 01/17/17; the patient's left posterior calf look better last week when I saw him and his wrap was changed 2 days ago. He has noted increasing pain in the left heel and arrives today with much larger wounds extensive erythema extending down into the entire heel area especially tender medially. He is not systemically unwell CBGs have  been controlled no fever. Our intake nurse showed me limegreen drainage on his AVD pads. 01/24/17; his usual this patient responds nicely to antibiotics last week giving him Levaquin for presumed Pseudomonas. The whole entire posterior part of his leg is much better much less inflamed and in the case of his Achilles heel area much less tender. He has also had some epithelialization posteriorly there are still open areas here and still draining but overall considerably better 01/31/17- He has continue to tolerate the compression wraps. he states that he continues to use the lymphedema pumps daily, and can increase to twice daily on the weekends. He is voicing no complaints or concerns regarding his LLE ulcers 02/07/17-he is here for follow-up evaluation. He states that he noted some erythema to the left medial and anterior thigh, which he states is new as of yesterday. He is concerned about recurrent cellulitis. He states his blood sugars have been slightly elevated, this morning in the 180s 02/14/17; he is here for follow-up evaluation. When he was last here there was erythema superiorly from his posterior wound in his anterior thigh. He was prescribed Levaquin however a culture of the wound surface grew MRSA over  the phone I changed him to doxycycline on Monday and things seem to be a lot better. 02/24/17; patient missed his appointment on Friday therefore we changed his nurse visit into a physician visit today. Still using silver alginate on the large area of the posterior left thigh. He isn't new area on the dorsal left second toe 03/03/17; actually better today although he admits he has not used his external compression pumps in the last 2 days or so because of work responsibilities over the weekend. 03/10/17; continued improvement. External compression pumps once a day almost all of his wounds have closed on the posterior left calf. Better edema control 03/17/17; in general improved. He still has 3 small  open areas on the lateral aspect of his left leg however most of the area on the posterior part of his leg is epithelialized. He has better edema control. He has an ABD pad under his stocking on the right anterior lower leg although he did not let us look at that today. 03/24/17; patient arrives back in clinic today with no open areas however there are areas on the posterior left calf and anterior left calf that are less than 100% epithelialized. His edema is well controlled in the left lower leg. There is some pitting edema probably lymphedema in the left upper thigh. He uses compression pumps at home once per day. I tried to get him to do this twice a day although he is very reticent. 04/01/2017 -- for the last 2 days he's had significant redness, tenderness and weeping and came in for an urgent visit today. 04/07/17; patient still has 6 more days of doxycycline. He was seen by Dr. Meyer Russel last Wednesday for cellulitis involving the posterior aspect, lateral aspect of his Involving his heel. For the most part he is better there is less erythema and less weeping. He has been on his feet for 12 hours 2 over the weekend. Using his compression pumps once a day 04/14/17 arrives today with continued improvement. Only one area on the posterior left calf that is not fully epithelialized. He has intense bilateral venous inflammation associated with his chronic venous insufficiency disease and secondary lymphedema. We have been using silver alginate to the left posterior calf wound In passing he tells [place] today that the right leg but we have not seen in quite some time has an open area on it but he doesn't want [place] to look at this today states he will show this to [place] next week. 04/21/17; there is no open area on his left leg although he still reports some weeping edema. He showed [place] his right leg today which is the first time we've seen this leg in a long time. He has a large area of open wound on the right leg  anteriorly healthy granulation. Quite a bit of swelling in the right leg and some degree of venous inflammation. He told us about the right leg in passing last week but states that deterioration in the right leg really only happened over the weekend 04/28/17; there is no open area on the left leg although there is an irritated part on the posterior which is like a wrap injury. The wound on the right leg which was new from last week at least to [place] is a lot better. 05/05/17; still no open area on the left leg. Patient is using his new compression stocking which seems to be doing a good job of controlling the edema. He states he is using his  compression pumps once per day. The right leg still has an open wound although it is better in terms of surface area. Required debridement. A lot of pain in the posterior right Achilles marked tenderness. Usually this type of presentation this patient gives concern for an active cellulitis 05/12/17; patient arrives today with his major wound from last week on the right lateral leg somewhat better. Still requiring debridement. He was using his compression stocking on the left leg however that is reopened with superficial wounds anteriorly he did not have an open wound on this leg previously. He is still using his juxta light's once daily at night. He cannot find the time to do this in the morning as he has to be at work by 7 AM 05/19/17; right lateral leg wound looks improved. No debridement required. The concerning area is on the left posterior leg which appears to almost have a subcutaneous hemorrhagic component to it. We've been using silver alginate to all the wounds 05/26/17; the right lateral leg wound continues to look improved. However the area on the left posterior calf is a tightly adherent surface. [place] using silver alginate. Because of the weeping edema in his legs there is very little good alternatives. 06/02/17; the patient left here last week looking quite  good. Major wound on the left posterior calf and a small one on the right lateral calf. Both of these look satisfactory. He tells me that by Wednesday he had noted increased pain in the left leg and drainage. He called on Thursday and Friday to get an appointment here but we were blocked. He did not go to urgent care or his primary physician. He thinks he had a fever on Thursday but did not actually take his temperature. He has not been using his compression pumps on the left leg because of pain. I advised him to go to the emergency room today for IV antibiotics for stents of left leg cellulitis but he has refused I have asked him to take 2 days off work to keep his leg elevated and he has refused this as well. In view of this I'm going to call him and Augmentin and doxycycline. 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 However after a detailed discussion regarding the risk benefits and alternatives he refuses to get admitted to the hospital. With no other recourse I will continue him on oral antibiotics as before and hopefully he'll have his infectious disease consultation this week. 06/16/2017 -- the patient was seen today by the nurse practitioner at infectious disease Ms. Dixon. 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 He has also been advise Lotrimin cream locally. she also had wise IV antibiotics for his condition if it flares up 06/23/17; patient arrives today with drainage bilaterally although the remaining wound on the left posterior calf after cleaning up today "highlighter  yellow drainage" did not look too bad. Unfortunately he has had breakdown on the right anterior leg [previously this leg had not been open and he is using a black stocking] he went to see infectious  disease and is been put on clindamycin 150 mg daily, I did not verify the dose although I'm not familiar with using clindamycin in this dosing range, perhaps for prophylaxisoo 06/27/17; I brought this patient back today to follow-up on the wound deterioration on the right lower leg together with surrounding cellulitis. I started him on doxycycline 4 days ago. This area looks better however he comes in today with intense cellulitis on the medial part of his left thigh. This is not have a wound in this area. Extremely tender. We've been using silver alginate to the wounds on the right lower leg left lower leg with bilateral 4 layer compression he is using his external compression pumps once a day 07/04/17; patient's left medial thigh cellulitis looks better. He has not been using his compression pumps as his insert said it was contraindicated with cellulitis. His right leg continues to make improvements all the wounds are still open. We only have one remaining wound on the left posterior calf. Using silver alginate to all open areas. He is on doxycycline which I started a week ago and should be finishing I gave him Augmentin after Thursday's visit for the severe cellulitis on the left medial thigh which fortunately looks better 07/14/17; the patient's left medial thigh cellulitis has resolved. The cellulitis in his right lower calf on the right also looks better. All of his wounds are stable to improved we've been using silver alginate he has completed the antibiotics I have given him. He has clindamycin 150 mg once a day prescribed by infectious disease for prophylaxis, I've advised him to start this now. We have been using bilateral Unna boots over silver alginate to the wound areas 07/21/17; the patient is  been to see infectious disease who noted his recurrent problems with cellulitis. He was not able to tolerate prophylactic clindamycin therefore he is on amoxicillin 500 twice a day. He also had a second daily dose of Lasix added By Dr. Oneta Rack but he is not taking this. Nor is he being completely compliant with his compression pumps a especially not this week. He has 2 remaining wounds one on the right posterior lateral lower leg and one on the left posterior medial lower leg. 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 The patient has Unna boots bilaterally. 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 He has bilateral Unna boots for compression and silver alginate to his wounds. Arrives today with his legs looking as good as I have seen him in quite some time. Not surprisingly his wounds look better as well with improvement on the right lateral leg venous insufficiency wound and also the left medial leg. He is still using the compression pumps once a day 08/11/17; both legs appear to be doing better wounds on the right lateral and left medial legs look better. Skin on the right leg quite good. He is been using silver alginate as the primary dressing. I'm going to use Anasept gel calcium alginate and maintain all the secondary dressings 08/18/17; the patient continues to actually do quite well. The area on his right lateral leg is just about closed the left medial also looks better although it is still moist in this area. His edema is well controlled we have been using Anasept gel with calcium alginate  and the usual secondary dressings, 4 layer compression and once daily use of his compression pumps "always been able to manage 09/01/17; the patient continues to do reasonably well in  spite of his trip to [place]. The area on the right lateral leg is epithelialized. Left is much better but still open. He has more edema and more chronic erythema on the left leg [venous inflammation] 09/08/17; he arrives today with no open wound on the right lateral leg and decently controlled edema. Unfortunately his left leg is not nearly as in his good situation as last week.he apparently had increasing edema starting on Saturday. He edema soaked through into his foot so used a plastic bag to walk around his home. The area on the medial right leg which was his open area is about the same however he has lost surface epithelium on the left lateral which is new and he has significant pain in the Achilles area of the left foot. He is already on amoxicillin chronically for prophylaxis of cellulitis in the left leg 09/15/17; he is completed a week of doxycycline and the cellulitis in the left posterior leg and Achilles area is as usual improved. He still has a lot of edema and fluid soaking through his dressings. There is no open wound on the right leg. He saw infectious disease NP today 09/22/17;As usual 1 we transition him from our compression wraps to his stockings things did not go well. He has several small open areas on the right leg. He states this was caused by the compression wrap on his skin although he did not wear this with the stockings over them. He has several superficial areas on the left leg medially laterally posteriorly. He does not have any evidence of active cellulitis especially involving the left Achilles The patient is traveling from [facility] Saturday going to [facility]. He states he isn't attempting to get an appointment with a heel objects wound center there to change his dressings. I am not completely certain whether this will work 10/06/17; the patient came in on Friday for a nurse visit and the nurse reported that his legs actually look quite good. He arrives in clinic  today for his regular follow-up visit. He has a new wound on his left third toe over the PIP probably caused by friction with his footwear. He has small areas on the left leg and a very superficial but epithelialized area on the right anterior lateral lower leg. Other than that his legs look as good as I've seen him in quite some time. We have been using silver alginate Review of systems; no chest pain no shortness of breath other than this a 10 point review of systems negative 10/20/17; seen by Dr. Meyer Russel last week. He had taken some antibiotics [doxycycline] that he had left over. Dr. Meyer Russel thought he had candida infection and declined to give him further antibiotics. He has a small wound remaining on the right lateral leg several areas on the left leg including a larger area on the left posterior several left medial and anterior and a small wound on the left lateral. The area on the left dorsal third toe looks a lot better. ROS; Gen.; no fever, respiratory no cough no sputum Cardiac no chest pain other than this 10 point review of system is negative 10/30/17; patient arrives today having fallen in the bathtub 3 days ago. It took him a while to get up. He has pain and maceration in the wounds on his  left leg which have deteriorated. He has not been using his pumps he also has some maceration on the right lateral leg. 11/03/17; patient continues to have weeping edema especially in the left leg. This saturates his dressings which were just put on on 12/27. As usual the doxycycline seems to take care of the cellulitis on his lower leg. He is not complaining of fever, chills, or other systemic symptoms. He states his leg feels a lot better on the doxycycline I gave him empirically. He also apparently gets injections at his primary doctor's officeo Rocephin for cellulitis prophylaxis. I didn't ask him about his compression pump compliance today I think that's probably marginal. Arrives in the clinic  with all of his dressings primary and secondary macerated full of fluid and he has bilateral edema 11/10/17; the patient's right leg looks some better although there is still a cluster of wounds on the right lateral. The left leg is inflamed with almost circumferential skin loss medially to laterally although we are still maintaining anteriorly. He does not have overt cellulitis there is a lot of drainage. He is not using compression pumps. We have been using silver alginate to the wound areas, there are not a lot of options here 11/17/17; the patient's right leg continues to be stable although there is still open wounds, better than last week. The inflammation in the left leg is better. Still loss of surface layer epithelium especially posteriorly. There is no overt cellulitis in the amount of edema and his left leg is really quite good, tells me he is using his compression pumps once a day. 11/24/17; patient's right leg has a small superficial wound laterally this continues to improve. The inflammation in the left leg is still improving however we have continuous surface layer epithelial loss posteriorly. There is no overt cellulitis in the amount of edema in both legs is really quite good. He states he is using his compression pumps on the left leg once a day for 5 out of 7 days 12/01/17; very small superficial areas on the right lateral leg continue to improve. Edema control in both legs is better today. He has continued loss of surface epithelialization and left posterior calf although I think this is better. We have been using silver alginate with large number of absorptive secondary dressings 4 layer on the left Unna boot on the right at his request. He tells me he is using his compression pumps once a day 12/08/17; he has no open area on the right leg is edema control is good here. On the left leg however he has marked erythema and tenderness breakdown of skin. He has what appears to be a wrap injury  just distal to the popliteal fossa. 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 There was swelling erythema and breakdown in the right leg. He has much worse skin breakdown in the left leg as well multiple open areas medially and posteriorly as well as laterally. He tells me he has been using his compression pumps but tells me he feels that the drainage out of his leg is worse when he uses a compression pumps. T be fair to him he is been saying this o for a while however I  don't know that I have really been listening to this. I wonder if the compression pumps are working properly 12/22/17;. Once again he arrives with severe erythema, weeping edema from the left greater than right leg. Noncompliance with compression pumps. New this visit he is complaining of pain on the lateral aspect of the right leg and the medial aspect of his right thigh. He apparently saw his cardiologist Dr. [name] who was ordered an echocardiogram area and I think this is a step in the right direction 12/25/17; started his doxycycline Monday night. There is still intense erythema of the right leg especially in the anterior thigh although there is less tenderness. The erythema around the wound on the right lateral calf also is less tender. He still complaining of pain in the left heel. His wounds are about the same right lateral left medial left lateral. Superficial but certainly not close to closure. He denies being systemically unwell no fever chills no abdominal pain no diarrhea 12/29/17; back in follow-up of his extensive right calf and right thigh cellulitis. I added amoxicillin to cover possible doxycycline resistant strep. This seems to of done the  trick he is in much less pain there is much less erythema and swelling. He has his echocardiogram at 11:00 this morning. X-ray of the left heel was also negative. 01/05/18; the patient arrived with his edema under much better control. Now that he is retired he is able to use his compression pumps daily and sometimes twice a day per the patient. He has a wound on the right leg the lateral wound looks better. Area on the left leg also looks a lot better. He has no evidence of cellulitis in his bilateral thighs I had a quick peak at his echocardiogram. He is in normal ejection fraction and normal left ventricular function. He has moderate pulmonary hypertension moderately reduced right ventricular function. One would have to wonder about chronic sleep apnea although he says he doesn't snore. He'll review the echocardiogram with his cardiologist. 01/12/18; the patient arrives with the edema in both legs under exemplary control. He is using his compression pumps daily and sometimes twice daily. His wound on the right lateral leg is just about closed. He still has some weeping areas on the posterior left calf and lateral left calf although everything is just about closed here as well. I have spoken with Aldean Baker who is the patient's nurse practitioner and infectious disease. She was concerned that the patient had not understood that the parenteral penicillin injections he was receiving for cellulitis prophylaxis was actually benefiting him. I don't think the patient actually saw that I would tend to agree we were certainly dealing with less infections although he had a serious one last month. 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 He is healed. He'll be placed in juxtalite compression wraps and increase his lymphedema pumps to twice daily. We will follow up again next week to ensure there are no issues with the new regiment. 01/20/18-he is here for evaluation of  bilateral lower extremity weeping edema. Yesterday he was placed in compression wrap to the right lower extremity and compression stocking to left lower shrubbery. He states he uses lymphedema pumps last night and again this morning and noted a blister to the left lower extremity. On exam he was noted to have drainage to the right lower extremity. He will be placed in Unna boots bilaterally and follow-up next week 01/26/18; patient was actually discharged a  week ago to his own juxta light stockings only to return the next day with bilateral lower extremity weeping edema.he was placed in bilateral Unna boots. He arrives today with pain in the back of his left leg. There is no open area on the right leg however there is a linear/wrap injury on the left leg and weeping edema on the left leg posteriorly. I spoke with infectious disease about 10 days ago. They were disappointed that the patient elected to discontinue prophylactic intramuscular penicillin shots as they felt it was particularly beneficial in reducing the frequency of his cellulitis. I discussed this with the patient today. He does not share this view. He'll definitely need antibiotics today. Finally he is traveling to [place] and trauma leaving this Saturday and returning a week later and he does not travel with his pumps. He is going by car 01/30/18; patient was seen 4 days ago and brought back in today for review of cellulitis in the left leg posteriorly. I put him on amoxicillin this really hasn't helped as much as I might like. He is also worried because he is traveling to [facility] by car. Finally we will be rewrapping him. There is no open area on the right leg over his left leg has multiple weeping areas as usual 02/09/18; The same wrap on for 10 days. He did not pick up the last doxycycline I prescribed for him. He apparently took 4 days worth he already had. There is nothing open on his right leg and the edema control is really  quite good. He's had damage in the left leg medially and laterally especially probably related to the prolonged use of Unna boots 02/12/18; the patient arrived in clinic today for a nurse visit/wrap change. He complained of a lot of pain in the left posterior calf. He is taking doxycycline that I previously prescribed for him. Unfortunately even though he used his stockings and apparently used to compression pumps twice a day he has weeping edema coming out of the lateral part of his right leg. This is coming from the lower anterior lateral skin area. 02/16/18; the patient has finished his doxycycline and will finish the amoxicillin 2 days. The area of cellulitis in the left calf posteriorly has resolved. He is no longer having any pain. He tells me he is using his compression pumps at least once a day sometimes twice. 02/23/18; the patient finished his doxycycline and Amoxil last week. On Friday he noticed a small erythematous circle about the size of a quarter on the left lower leg just above his ankle. This rapidly expanded and he now has erythema on the lateral and posterior part of the thigh. This is bright red. Also has an area on the dorsal foot just above his toes and a tender area just below the left popliteal fossa. He came off his prophylactic penicillin injections at his own insistence one or 2 months ago. This is obviously deteriorated since then 03/02/18; patient is on doxycycline and Amoxil. Culture I did last week of the weeping area on the back of his left calf grew group B strep. I have therefore renewed the amoxicillin 500 3 times a day for a further week. He has not been systemically unwell. Still complaining of an area of discomfort right under his left popliteal fossa. There is no open wound on the right leg. He tells me that he is using his pumps twice a day on most days 03/09/18; patient arrives in clinic today completing  his amoxicillin today. The cellulitis on his left leg is  better. Furthermore he tells me that he had intramuscular penicillin shots that his primary care office today. However he also states that the wrap on his right leg fell down shortly after leaving clinic last week. He developed a large blister that was present when he came in for a nurse visit later in the week and then he developed intense discomfort around this area.He tells me he is using his compression pumps 03/16/18; the patient has completed his doxycycline. The infectious part of this/cellulitis in the left heel area left popliteal area is a lot better. He has 2 open areas on the right calf. Still areas on the left calf but this is a lot better as well. 03/24/18; the patient arrives complaining of pain in the left popliteal area again. He thinks some of this is wrap injury. He has no open area on the right leg and really no open area on the left calf either except for the popliteal area. He claims to be compliant with the compression pumps 03/31/18; I gave him doxycycline last week because of cellulitis in the left popliteal area. This is a lot better although the surface epithelium is denuded off and response to this. He arrives today with uncontrolled edema in the right calf area as well as a fingernail injury in the right lateral calf. There is only a few open areas on the left 04/06/18; I gave him amoxicillin doxycycline over the last 2 weeks that the amoxicillin should be completing currently. He is not complaining of any pain or systemic symptoms. The only open areas see has is on the right lateral lower leg paradoxically I cannot see anything on the left lower leg. He tells me he is using his compression pumps twice a day on most days. Silver alginate to the wounds that are open under 4 layer compression 04/13/18; he completed antibiotics and has no new complaints. Using his compression pumps. Silver alginate that anything that's opened 04/20/18; he is using his compression pumps religiously.  Silver alginate 4 layer compression anything that's opened. He comes in today with no open wounds on the left leg but 3 on the right including a new one posteriorly. He has 2 on the right lateral and one on the right posterior. He likes Unna boots on the right leg for reasons that aren't really clear we had the usual 4 layer compression on the left. It may be necessary to move to the 4 layer compression on the right however for now I left them in the Unna boots 04/27/18; he is using his compression pumps at least once a day. He has still the wounds on the right lateral calf. The area right posteriorly has closed. He does not have an open wound on the left under 4 layer compression however on the dorsal left foot just proximal to the toes and the left third toe 2 small open areas were identified 05/11/18; he has not uses compression pumps. The areas on the right lateral calf have coalesced into one large wound necrotic surface. On the left side he has one small wound anteriorly however the edema is now weeping out of a large part of his left leg. He says he wasn't using his pumps because of the weeping fluid. I explained to him that this is the time he needs to pump more 05/18/18; patient states he is using his compression pumps twice a day. The area on the right lateral  large wound albeit superficial. On the left side he has innumerable number of small new wounds on the left calf particularly laterally but several anteriorly and medially. All these appear to have healthy granulated base these look like the remnants of blisters however they occurred under compression. The patient arrives in clinic today with his legs somewhat better. There is certainly less edema, less multiple open areas on the left calf and the right anterior leg looks somewhat better as well superficial and a little smaller. However he relates pain and erythema over the last 3-4 days in the thigh and I looked at this today. He has not  been systemically unwell no fever no chills no change in blood sugar values 05/25/18; comes in today in a better state. The severe cellulitis on his left leg seems better with the Keflex. Not as tender. He has not been systemically unwell Hard to find an open wound on the left lower leg using his compression pumps twice a day The confluent wounds on his right lateral calf somewhat better looking. These will ultimately need debridement I didn't do this today. 06/01/18; the severe cellulitis on the left anterior thigh has resolved and he is completed his Keflex. There is no open wound on the left leg however there is a superficial excoriation at the base of the third toe dorsally. Skin on the bottom of his left foot is macerated looking. The left the wounds on the lateral right leg actually looks some better although he did require debridement of the top half of this wound area with an open curet 06/09/18 on evaluation today patient appears to be doing poorly in regard to his right lower extremity in particular this appears to likely be infected he has very thick purulent discharge along with a bright green tent to the discharge. This makes me concerned about the possibility of pseudomonas. He's also having increased discomfort at this point on evaluation. Fortunately there does not appear to be any evidence of infection spreading to the other location at this time. 06/16/18 on evaluation today patient appears to actually be doing fairly well. His ulcer has actually diminished in size quite significantly at this point which is good news. Nonetheless he still does have some evidence of infection he did see infectious disease this morning before coming here for his appointment. I did review the results of their evaluation and their note today. They did actually have him discontinue the Cipro and initiate treatment with linezolid at this time. He is doing this for the next seven days and they recommended a  follow-up in four months with them. He is the keep a log of the need for intermittent antibiotic therapy between now and when he falls back up with infectious disease. This will help them gaze what exactly they need to do to try and help them out. 06/23/18; the patient arrives today with no open wounds on the left leg and left third toe healed. He is been using his compression pumps twice a day. On the right lateral leg he still has a sizable wound but this is a lot better than last time I saw this. 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 Has been using silver alginate under 4 layer compression 06/30/18; the only open wound he has is on the right lateral leg and this looks healthy. No debridement is required. We have been using silver  alginate. He does not have an open wound on the left leg. There is apparently some drainage from the dorsal proximal third toe on the left although I see no open wound here. 07/03/18 on evaluation today patient was actually here just for a nurse visit rapid change. However when he was here on Wednesday for his rat change due to having been healed on the left and then developing blisters we initiated the wrap again knowing that he would be back today for [place] to reevaluate and see were at. Unfortunately he has developed some cellulitis into the proximal portion of his right lower extremity even into the region of his thigh. 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 He was placed on one as what at that point. Nonetheless he is done with that and has been tolerating it well otherwise. Doxycycline which in the past really did not seem to be effective for him. Nonetheless I think the best option may be for [place] to definitely reinitiate the antibiotics for a longer period of time. 07/07/18; since I last saw this patient a week ago he has  had a difficult time. At that point he did not have an open wound on his left leg. We transitioned him into juxta light stockings. He was apparently in the clinic the next day with blisters on the left lateral and left medial lower calf. He also had weeping edema fluid. He was put back into a compression wrap. He was also in the clinic on Friday with intense erythema in his right thigh. Per the patient he was started on Bactrim however that didn't work at all in terms of relieving his pain and swelling. He has taken 3 doxycycline that he had left over from last time and that seems to of helped. He has blistering on the right thigh as well. 07/14/18; the erythema on his right thigh has gotten better with doxycycline that he is finishing. 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 Presumably this cellulitis in the thigh was not related to that although I think this is a bit concerning going forward. He still has an area on the right lateral calf the blister on the right medial calf just below the knee that was discussed above. On the left 2 small open areas left medial and left lateral. Edema control is adequate. He is using his compression pumps twice a day 07/20/18; continued improvement in the condition of both legs especially the edema in his bilateral thighs. He tells me he is been losing weight through a combination of diet and exercise. He is using his compression pumps twice a day. So overall she made to the remaining wounds 07/27/2018; continued improvement in condition of both legs. His edema is well controlled. The area on the right lateral leg is just about closed he had one blisters show up on the medial left upper calf. We have him in 4 layer compression. He is going on a 10-day trip to [place], [place] and [place]. He will be driving. He wants to wear Unna boots because of the lessening amount of constriction.  He will not use compression pumps while he is away 08/05/18 on evaluation today patient actually appears to be doing decently well all things considered in regard to his bilateral lower extremities. The worst ulcer is actually only posterior aspect of his left lower  extremity with a four layer compression wrap cut into his leg a couple weeks back. He did have a trip and actually had [employer] for the trip that he is worn since he was last here. Nonetheless he feels like the [employer] actually do better for him his swelling is up a little bit but he also with his trip was not taking his Lasix on a regular set schedule like he was supposed to be. He states that obviously the reason being that he cannot drive and keep going without having to urinate too frequently which makes it difficult. He did not have his pumps with him while he was away either which I think also maybe playing a role here too. 08/13/2018; the patient only has a small open wound on the right lateral calf which is a big improvement in the last month or 2. He also has the area posteriorly just below the posterior fossa on the left which I think was a wrap injury from several weeks ago. He has no current evidence of cellulitis. He tells me he is back into his compression pumps twice a day. He also tells me that while he was at the laundromat somebody stole a section of his extremitease stockings 08/20/2018; back in the clinic with a much improved state. He only has small areas on the right lateral mid calf which is just about healed. This was is more substantial area for quite a prolonged period of time. He has a small open area on the left anterior tibia. The area on the posterior calf just below the popliteal fossa is closed today. He is using his compression pumps twice a day 08/28/2018; patient has no open wound on the right leg. He has a smattering of open areas on the calf with some weeping lymphedema. More  problematically than that it looks as though his wraps of slipped down in his usual he has very angry upper area of edema just below the right medial knee and on the right lateral calf. He has no open area on his feet. The patient is traveling to [facility] next week. I will send him in an antibiotic. We will continue to wrap the right leg. We ordered extremitease stockings for him last week and I plan to transition the right leg to a stocking when he gets home which will be in 10 days time. As usual he is very reluctant to take his pumps with him when he travels 09/07/2018; patient returns from [facility]. He shows me a picture of his left leg in the mid part of his trip last week with intense fire engine erythema. The picture look bad enough I would have considered sending him to the hospital. Instead he went to the wound care center in [facility]. They did not prescribe him antibiotics but he did take some doxycycline he had leftover from a previous visit. I had given him trimethoprim sulfamethoxazole before he left this did not work according to the patient. This is resulted in some improvement fortunately. He comes back with a large wound on the left posterior calf. Smaller area on the left anterior tibia. Denuded blisters on the dorsal left foot over his toes. Does not have much in the way of wounds on the right leg although he does have a very tender area on the right posterior area just below the popliteal fossa also suggestive of infection. He promises me he is back on his pumps twice a  day 09/15/2018; the intense cellulitis in his left lower calf is a lot better. The wound area on the posterior left calf is also so better. However he has reasonably extensive wounds on the dorsal aspect of his second and third toes and the proximal foot just at the base of the toes. There is nothing open on the right leg 09/22/2018; the patient has excellent edema control in his legs  bilaterally. He is using his external compression pumps twice a day. He has no open area on the right leg and only the areas in the left foot dorsally second and third toe area on the left side. He does not have any signs of active cellulitis. 10/06/2018; the patient has good edema control bilaterally. He has no open wound on the right leg. There is a blister in the posterior aspect of his left calf that we had to deal with today. He is using his compression pumps twice a day. There is no signs of active cellulitis. We have been using silver alginate to the wound areas. He still has vulnerable areas on the base of his left first second toes dorsally He has a his extremities stockings and we are going to transition him today into the stocking on the right leg. He is cautioned that he will need to continue to use the compression pumps twice a day. If he notices uncontrolled edema in the right leg he may need to go to 3 times a day. 10/13/2018; the patient came in for a nurse check on Friday he has a large flaccid blister on the right medial calf just below the knee. We unroofed this. He has this and a new area underneath the posterior mid calf which was undoubtedly a blister as well. He also has several small areas on the right which is the area we put his extremities stocking on. 10/19/2018; the patient went to see infectious disease this morning I am not sure if that was a routine follow-up in any case the doxycycline I had given him was discontinued and started on linezolid. He has not started this. It is easy to look at his left calf and the inflammation and think this is cellulitis however he is very tender in the tissue just below the popliteal fossa and I have no doubt that there is infection going on here. He states the problem he is having is that with the compression pumps the edema goes down and then starts walking the wrap falls down. We will see if we can adhere this. He has 1 or 2 minuscule  open areas on the right still areas that are weeping on the posterior left calf, the base of his left second and third toes 10/26/18; back today in clinic with quite of skin breakdown in his left anterior leg. This may have been infection the area below the popliteal fossa seems a lot better however tremendous epithelial loss on the left anterior mid tibia area over quite inexpensive tissue. He has 2 blisters on the right side but no other open wound here. 10/29/2018; came in urgently to see [place] today and we worked him in for review. He states that the 4 layer compression on the right leg caused pain he had to cut it down to roughly his mid calf this caused swelling above the wrap and he has blisters and skin breakdown today. As a result of the pain he has not been using his pumps. Both legs are a lot more edematous and there  is a lot of weeping fluid. 11/02/18; arrives in clinic with continued difficulties in the right leg> left. Leg is swollen and painful. multiple skin blisters and new open areas especially laterally. He has not been using his pumps on the right leg. 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 He is not systemically unwell. 11/09/2018; the patient claims he is being compliant with his pumps. He is finished the doxycycline I gave him last week. Culture I did of the wound on the right lateral leg showed a few very resistant methicillin staph aureus. This was resistant to doxycycline. Nevertheless he states the pain in the leg is a lot better which makes me wonder if the cultured organism was not really what was causing the problem nevertheless this is a very dangerous organism to be culturing out of any wound. His right leg is still a lot larger than the left. He is using an Radio broadcast assistant on this area, he blames a 4-layer compression for causing the original skin breakdown which I doubt is true however I cannot talk him out of it. We have been  using silver alginate to all of these areas which were initially blisters 11/16/2018; patient is being compliant with his external compression pumps at twice a day. Miraculously he arrives in clinic today with absolutely no open wounds. He has better edema control on the left where he has been using 4 layer compression versus wound of wounds on the right and I pointed this out to him. There is no inflammation in the skin in his lower legs which is also somewhat unusual for him. There is no open wounds on the dorsal left foot. He has extremitease stockings at home and I have asked him to bring these in next week. 11/25/18 patient's lower extremity on examination today on the left appears for the most part to be wound free. He does have an open wound on the lateral aspect of the right lower extremity but this is minimal compared to what I've seen in past. He does request that we go ahead and wrap the left leg as well even though there's nothing open just so hopefully it will not reopen in short order. 1/28; patient has superficial open wounds on the right lateral calf left anterior calf and left posterior calf. His edema control is adequate. 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 We have been using silver alginate as the primary dressing. He claims compliance with his compression pumps 2/4; patient has superficial open wounds on numerous areas of his left calf and again one on the left dorsal foot. The areas on the right lateral calf have healed. The cellulitis that I gave him doxycycline for last week is also resolved this was mostly on the left anterior calf just below the tibial tuberosity. His edema looks fairly well-controlled. He tells me he went to see his primary doctor today and had blood work ordered 2/11; once again he has several open areas on the left calf left tibial area. Most of these are small and  appear to have healthy granulation. He does not have anything open on the right. The edema and control in his thighs is pretty good which is usually a good indication he has been using his pumps as requested. 2/18; he continues to have several small areas on the left calf and left tibial area. Most of  these are small healthy granulation. We put him in his stocking on the right leg last week and he arrives with a superficial open area over the right upper tibia and a fairly large area on the right lateral tibia in similar condition. His edema control actually does not look too bad, he claims to be using his compression pumps twice a day 2/25. Continued small areas on the left calf and left tibial area. New areas especially on the right are identified just below the tibial tuberosity and on the right upper tibia itself. There are also areas of weeping edema fluid even without an obvious wound. He does not have a considerable degree of lymphedema but clearly there is more edema here than his skin can handle. He states he is using the pumps twice a day. We have an Unna boot on the right and 4 layer compression on the left. 3/3; he continues to have an area on the right lateral calf and right posterior calf just below the popliteal fossa. There is a fair amount of tenderness around the wound on the popliteal fossa but I did not see any evidence of cellulitis, could just be that the wrap came down and rubbed in this area. He does not have an open area on the left leg however there is an area on the left dorsal foot at the base of the third toe We have been using silver alginate to all wound areas 3/10; he did not have an open area on his left leg last time he was here a week ago. T oday he arrives with a horizontal wound just below the tibial tuberosity and an area on the left lateral calf. He has intense erythema and tenderness in this area. The area is on the right lateral calf and right posterior calf  better than last week. We have been using silver alginate as usual 3/18 - Patient returns with 3 small open areas on left calf, and 1 small open area on right calf, the skin looks ok with no significant erythema, he continues the UNA boot on right and 4 layer compression on left. The right lateral calf wound is closed , the right posterior is small area. we will continue silver alginate to the areas. 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 He is been tolerating the dressing changes without complication. Fortunately she has made excellent improvement in regard to the overall status of his wounds. Unfortunately every time we cease wrapping him he ends up reopening in causing more significant issues at that point. Again I'm unsure of the best direction to take although I think the lymphedema clinic may be appropriate for him. 02/03/19 on evaluation today patient appears to be doing well in regard to the wounds that we saw him for last week unfortunately he has a new area on the proximal portion of his right medial/posterior lower extremity where the wrap somewhat slowed down and caused swelling and a blister to rub and open. Unfortunately this is the only opening that he has on either leg at this point. 02/17/19 on evaluation today patient's bilateral lower extremities appear to be doing well. He still completely healed in regard to the left lower extremity. In regard to the right lower extremity the area where the wrap and slid down and caused the  blister still seems to be slightly open although this is dramatically better than during the last evaluation two weeks ago. I'm very pleased with the way this stands overall. 03/03/19 on evaluation today patient appears to be doing well in regard to his right lower  extremity in general although he did have a new blister open this does not appear to be showing any evidence of active infection at this time. Fortunately there's No fevers, chills, nausea, or vomiting noted at this time. Overall I feel like he is making good progress it does feel like that the right leg will we perform the [employer] seems to do with a bit better than three layer wrap on the left which slid down on him. We may switch to doing bilateral in the book wraps. 5/4; I have not seen Mr. Cush in quite some time. According to our case manager he did not have an open wound on his left leg last week. He had 1 remaining wound on the right posterior medial calf. He arrives today with multiple openings on the left leg probably were blisters and/or wrap injuries from Unna boots. I do not think the Unna boot's will provide adequate compression on the left. I am also not clear about the frequency he is using the compression pumps. 03/17/19 on evaluation today patient appears to be doing excellent in regard to his lower extremities compared to last week's evaluation apparently. He had gotten significantly worse last week which is unfortunate. The [employer] wrap on the left did not seem to do very well for him at all and in fact it didn't control his swelling significantly enough he had an additional outbreak. Subsequently we go back to the four layer compression wrap on the left. This is good news. At least in that he is doing better and the wound seem to be killing him. He still has not heard anything from the lymphedema clinic. 03/24/19 on evaluation today patient actually appears to be doing much better in regard to his bilateral lower Trinity as compared to last week when I saw him. Fortunately there's no signs of active infection at this time. He has been tolerating the dressing changes without complication. Overall I'm extremely pleased with the progress and appearance in general. 04/07/19 on  evaluation today patient appears to be doing well in regard to his bilateral lower extremities. His swelling is significantly down from where it was previous. With that being said he does have a couple blisters still open at this point but fortunately nothing that seems to be too severe and again the majority of the larger openings has healed at this time. 04/14/19 on evaluation today patient actually appears to be doing quite well in regard to his bilateral lower extremities in fact I'm not even sure there's anything significantly open at this time at any site. Nonetheless he did have some trouble with these wraps where they are somewhat irritating him secondary to the fact that he has noted that the graph wasn't too close down to the end of this foot in a little bit short as well up to his knee. Otherwise things seem to be doing quite well. 04/21/19 upon evaluation today patient's wound bed actually showed evidence of being completely healed in regard to both lower extremities which is excellent news. There does not appear to be any signs of active infection which is also good news. I'm very pleased in this regard. No fevers, chills, nausea, or vomiting noted at this time.  04/28/19 on evaluation today patient appears to be doing a little bit worse in regard to both lower extremities on the left mainly due to the fact that when he went infection disease the wrap was not wrapped quite high enough he developed a blister above this. On the right he is a small open area of nothing too significant but again this is continuing to give him some trouble he has been were in the Velcro compression that he has at home. 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 He's been tolerating the dressing changes without complication. Fortunately there's no signs of active infection at this time. No fevers, chills, nausea, or vomiting noted at this time. We have  been trying to get an appointment with her lymphedema clinic in [facility] but unfortunately nobody can get them on phone with not been able to even fax information over the patient likewise is not been able to get in touch with them. Overall I'm not sure exactly what's going on here with to reach out again today. 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 Still having a lot of drainage unfortunately. He tells me especially in the left but even on the right. There's no signs of active infection which is good news we've been using so ratcheted up to this point. 05/19/19 on evaluation today patient actually appears to be doing quite well with regard to his left lower extremity which is great news. Fortunately in regard to the right lower extremity has an issues with his wrap and he subsequently did remove this from what I'm understanding. Nonetheless long story short is what he had rewrapped once he removed it subsequently had maggots underneath this wrap whenever he came in for evaluation today. With that being said they were obviously completely cleaned away by the nursing staff. The visit today which is excellent news. However he does appear to potentially have some infection around the right ankle region where the maggots were located as well. He will likely require anabiotic therapy today. 05/26/19 on evaluation today patient actually appears to be doing much better in regard to his bilateral lower extremities. I feel like the infection is under much better control. With that being said there were maggots noted when the wrap was removed yet again today. Again this could have potentially been left over from previous although at this time there does not appear to be any signs of significant drainage there was obviously on the wrap some drainage as well this contracted gnats or otherwise. Either way I do  not see anything that appears to be doing worse in my pinion and in fact I think his drainage has slowed down quite significantly likely mainly due to the fact to his infection being under better control. 06/02/2019 on evaluation today patient actually appears to be doing well with regard to his bilateral lower extremities there is no signs of active infection at this time which is great news. With that being said he does have several open areas more so on the right than the left but nonetheless these are all significantly better than previously noted. 06/09/2019 on evaluation today patient actually appears to be doing well. His wrap stayed up and he did not cause any problems he had more drainage on the right compared to the left but overall I do not see any major issues  at this time which is great news. 06/16/2019 on evaluation today patient appears to be doing excellent with regard to his lower extremities the only area that is open is a new blister that can have opened as of today on the medial ankle on the left. Other than this he really seems to be doing great I see no major issues at this point. 06/23/2019 on evaluation today patient appears to be doing quite well with regard to his bilateral lower extremities. In fact he actually appears to be almost completely healed there is a small area of weeping noted of the right lower extremity just above the ankle. Nonetheless fortunately there is no signs of active infection at this time which is good news. No fevers, chills, nausea, vomiting, or diarrhea. 8/24; the patient arrived for a nurse visit today but complained of very significant pain in the left leg and therefore I was asked to look at this. Noted that he did not have an open area on the left leg last week nevertheless this was wrapped. The patient states that he is not been able to put his compression pumps on the left leg because of the discomfort. He has not been systemically unwell 06/30/2019  on evaluation today patient unfortunately despite being excellent last week is doing much worse with regard to his left lower extremity today. In fact he had to come in for a nurse on Monday where his left leg had to be rewrapped due to excessive weeping Dr. Leanord Hawking placed him on doxycycline at that point. Fortunately there is no signs of active infection Systemically at this time which is good news. 07/07/2019 in regard to the patient's wounds today he actually seems to be doing well with his right lower extremity there really is nothing open or draining at this point this is great news. Unfortunately the left lower extremity is given him additional trouble at this time. There does not appear to be any signs of active infection nonetheless he does have a lot of edema and swelling noted at this point as well as blistering all of which has led to a much more poor appearing leg at this time compared to where it was 2 weeks ago when it was almost completely healed. Obviously this is a little discouraging for the patient. He is try to contact the lymphedema clinic in [place] he has not been able to get through to them. 07/14/2019 on evaluation today patient actually appears to be doing slightly better with regard to his left lower extremity ulcers. Overall I do feel like at least at the top of the wrap that we have been placing this area has healed quite nicely and looks much better. The remainder of the leg is showing signs of improvement. Unfortunately in the thigh area he still has an open region on the left and again on the right he has been utilizing just a Band-Aid on an area that also opened on the thigh. Again this is an area that were not able to wrap although we did do an Ace wrap to provide some compression that something that obviously is a little less effective than the compression wraps we have been using on the lower portion of the leg. He does have an appointment with the lymphedema clinic  in [facility] on Friday. 07/21/2019 on evaluation today patient appears to be doing better with regard to his lower extremity ulcers. He has been tolerating the dressing changes without complication. Fortunately there is no signs of  active infection at this time. No fevers, chills, nausea, vomiting, or diarrhea. I did receive the paperwork from the physical therapist at the lymphedema clinic in [place]. Subsequently I signed off on that this morning and sent that back to him for further progression with the treatment plan. 07/28/2019 on evaluation today patient appears to be doing very well with regard to his right lower extremity where I do not see any open wounds at this point. Fortunately he is feeling great as far as that is concerned as well. In regard to the left lower extremity he has been having issues with still several areas of weeping and edema although the upper leg is doing better his lower leg still I think is going require the compression wrap at this time. No fevers, chills, nausea, vomiting, or diarrhea. 08/04/2019 on evaluation today patient unfortunately is having new wounds on the right lower extremity. Again we have been using Unna boot wrap on that side. We switched him to using his juxta lite wrap at home. With that being said he tells me he has been using it although his legs extremely swollen and to be honest really does not appear that he has been. I cannot know that for sure however. Nonetheless he has multiple new wounds on the right lower extremity at this time. Obviously we will have to see about getting this rewrapped for him today. 08/11/2019 on evaluation today patient appears to be doing fairly well with regard to his wounds. He has been tolerating the dressing changes including the compression wraps without complication. He still has a lot of edema in his upper thigh regions bilaterally he is supposed to be seeing the lymphedema clinic on the 15th of this month  once his wraps arrive for the upper part of his legs. 08/18/2019 on evaluation today patient appears to be doing well with regard to his bilateral lower extremities at this point. He has been tolerating the dressing changes without complication. Fortunately there is no signs of active infection which is also good news. He does have a couple weeping areas on the first and second toe of the right foot he also has just a small area on the left foot upper leg and a small area on the left lower leg but overall he is doing quite well in my opinion. He is supposed to be getting his wraps shortly in fact tomorrow and then subsequently is seeing the lymphedema clinic next Wednesday on the 21st. Of note he is also leaving on the 25th to go on vacation for a week to the beach. For that reason and since there is some uncertainty about what there can be doing at lymphedema clinic next Wednesday I am get a make an appointment for next Friday here for [place] to see what we need to do for him prior to him leaving for vacation. 10/23; patient arrives in considerable pain predominantly in the upper posterior calf just distal to the popliteal fossa also in the wound anteriorly above the major wound. This is probably cellulitis and he has had this recurrently in the past. He has no open wound on the right side and he has had an Radio broadcast assistant in that area. Finally I note that he has an area on the left posterior calf which by enlarge is mostly epithelialized. This protrudes beyond the borders of the surrounding skin in the setting of dry scaly skin and lymphedema. The patient is leaving for [facility] on Sunday. Per his longstanding pattern,  he will not take his compression pumps with him predominantly out of fear that they will be stolen. He therefore asked that we put a Unna boot back on the right leg. He will also contact the wound care center in [facility] to see if they can change his dressing in the mid week. 11/3;  patient returned from his vacation to [facility]. He was seen on 1 occasion at their wound care center. They did a 2 layer compression system as they did not have our 4-layer wrap. I am not completely certain what they put on the wounds. They did not change the Unna boot on the right. The patient is also seeing a lymphedema specialist physical therapist in [place]. It appears that he has some compression sleeve for his thighs which indeed look quite a bit better than I am used to seeing. He pumps over these with his external compression pumps. 11/10; the patient has a new wound on the right medial thigh otherwise there is no open areas on the right. He has an area on the left leg posteriorly anteriorly and medially and an area over the left second toe. We have been using silver alginate. He thinks the injury on his thigh is secondary to friction from the compression sleeve he has. 11/17; the patient has a new wound on the right medial thigh last week. He thinks this is because he did not have a underlying stocking for his thigh juxta lite apparatus. He now has this. The area is fairly large and somewhat angry but I do not think he has underlying cellulitis. He has a intact blister on the right anterior tibial area. Small wound on the right great toe dorsally Small area on the medial left calf. 11/30; the patient does not have any open areas on his right leg and we did not take his juxta lite stocking off. However he states that on Friday his compression wrap fell down lodging around his upper mid calf area. As usual this creates a lot of problems for him. He called urgently today to be seen for a nurse visit however the nurse visit turned into a provider visit because of extreme erythema and pain in the left anterior tibia extending laterally and posteriorly. The area that is problematic is extensive 10/06/2019 upon evaluation today patient actually appears to be doing poorly in regard to his  left lower extremity. He Dr. Leanord Hawking did place him on doxycycline this past Monday apparently due to the fact that he was doing much worse in regard to this left leg. Fortunately the doxycycline does seem to be helping. Unfortunately we are still having a very difficult time getting his edema under any type of control in order to anticipate discharge at some point. The only way were really able to control his lymphedema really is with compression wraps and that has only even seemingly temporary. He has been seeing a lymphedema clinic they are trying to help in this regard but still this has been somewhat frustrating in general for the patient. 10/13/19 on evaluation today patient appears to be doing excellent with regard to his right lower extremity as far as the wounds are concerned. His swelling is still quite extensive unfortunately. He is still having a lot of drainage from the thigh areas bilaterally which is unfortunate. He's been going to lymphedema clinic but again he still really does not have this edema under control as far as his lower extremities are concern. With regard to his left  lower extremity this seems to be improving and I do believe the doxycycline has been of benefit for him. He is about to complete the doxycycline. 10/20/2019 on evaluation today patient appears to be doing poorly in regard to his bilateral lower extremities. More in the right thigh he has a lot of irritation at this site unfortunately. In regard to the left lower extremity the wrap was not quite as high it appears and does seem to have caused him some trouble as well. Fortunately there is no evidence of systemic infection though he does have some blue-green drainage which has me concerned for the possibility of Pseudomonas. He tells me he is previously taking Cipro without complications and he really does not care for Levaquin however due to some of the side effects he has. He is not allergic to any medications  specifically antibiotics that were aware of. 10/27/2019 on evaluation today patient actually does appear to be for the most part doing better when compared to last week's evaluation. With that being said he still has multiple open wounds over the bilateral lower extremities. He actually forgot to start taking the Cipro and states that he still has the whole bottle. He does have several new blisters on left lower extremity today I think I would recommend he go ahead and take the Cipro based on what I am seeing at this point. 12/30-Patient comes at 1 week visit, 4 layer compression wraps on the left and Unna boot on the right, primary dressing Xtrasorb and silver alginate. Patient is taking his Cipro and has a few more days left probably 5-6, and the legs are doing better. He states he is using his compressions devices which I believe he has 11/10/2019 on evaluation today patient actually appears to be much better than last time I saw him 2 weeks ago. His wounds are significantly improved and overall I am very pleased in this regard. Fortunately there is no signs of active infection at this time. He is just a couple of days away from completing Cipro. Overall his edema is much better he has been using his lymphedema pumps which I think is also helping at this point. 11/17/2019 on evaluation today patient appears to be doing excellent in regard to his wounds in general. His legs are swollen but not nearly as much as they have been in the past. Fortunately he is tolerating the compression wraps without complication. No fevers, chills, nausea, vomiting, or diarrhea. He does have some erythema however in the distal portion of his right lower extremity specifically around the forefoot and toes there is a little bit of warmth here as well. 11/24/2019 on evaluation today patient appears to be doing well with regard to his right lower extremity I really do not see any open wounds at this point. His left lower  extremity does have several open areas and his right medial thigh also is open. Other than this however overall the patient seems to be making good progress and I am very pleased at this point. 12/01/2019 on evaluation today patient appears to be doing poorly at this point in regard to his left lower extremity has several new blisters despite the fact that we have him in compression wraps. In fact he had a 4-layer compression wrap, his upper thigh wrapped from lymphedema clinic, and a juxta light over top of the 4 layer compression wrap the lymphedema clinic applied and despite all this he still develop blisters underneath. Obviously this does have me concerned  about the fact that unfortunately despite what we are doing to try to get wounds healed he continues to have new areas arise I do not think he is ever good to be at the point where he can realistically just use wraps at home to keep things under control. Typically when we heal him it takes about 1-2 days before he is back in the clinic with severe breakdown and blistering of his lower extremities bilaterally. This is happened numerous times in the past. Unfortunately I think that we may need some help as far as overall fluid overload to kind of limit what we are seeing and get things under better control. 12/08/2019 on evaluation today patient presents for follow-up concerning his ongoing bilateral lower extremity edema. Unfortunately he is still having quite a bit of swelling the compression wraps are controlling this to some degree but he did see Dr. [name] his cardiologist I do have that available for review today as far as the appointment was concerned that was on 12/06/2019. Obviously that she has been 2 days ago. The patient states that he is only been taking the Lasix 80 mg 1 time a day he had told me previously he was taking this twice a day. Nonetheless Dr. [name] recommended this be up to 80 mg 2 times a day for the patient as he did appear to  be fluid overloaded. With that being said the patient states he did this yesterday and he was unable to go anywhere or do anything due to the fact that he was constantly having to urinate. Nonetheless I think that this is still good to be something that is important for him as far as trying to get his edema under control at all things that he is going to be able to just expect his wounds to get under control and things to be better without going through at least a period of time where he is trying to stabilize his fluid management in general and I think increasing the Lasix is likely the first step here. It was also mentioned the possibility that the patient may require metolazone. With that being said he wanted to have the patient take Lasix twice a day first and then reevaluating 2 months to see where things stand. 12/15/2019 upon evaluation today patient appears to be doing regard to his legs although his toes are showing some signs of weeping especially on the left at this point to some degree on the right. There does not appear to be any signs of active infection and overall I do feel like the compression wraps are doing well for him but he has not been able to take the Lasix at home and the increased dose that Dr. [name] recommended. He tells me that just not go to be feasible for him. Nonetheless I think in this case he should probably send a message to Dr. [name] in order to discuss options from the standpoint of possible admission to get the fluid off or otherwise going forward. 12/22/2019 upon evaluation today patient appears to be doing fairly well with regard to his lower extremities at this point. In fact he would be doing excellent if it was not for the fact that his right anterior thigh apparently had an allergic reaction to adhesive tape that he used. The wound itself that we have been monitoring actually appears to be healed. There is a lot of irritation at this point. 12/29/2019 upon  evaluation today patient appears to be doing well  in regard to his lower extremities. His left medial thigh is open and somewhat draining today but this is the only region that is open the right has done much better with the treatment utilizing the steroid cream that I prescribed for him last week. Overall I am pleased in that regard. Fortunately there is no signs of active infection at this time. No fevers, chills, nausea, vomiting, or diarrhea. 01/05/2020 upon evaluation today patient appears to be doing more poorly in regard to his right lower extremity at this point upon evaluation today. Unfortunately he continues to have issues in this regard and I think the biggest issue is controlling his edema. This obviously is not very well controlled at this point is been recommended that he use the Lasix twice a day but he has not been able to do that. Unfortunately I think this is leading to an issue where honestly he is not really able to effectively control his edema and therefore the wounds really are not doing significantly better. I do not think that he is going to be able to keep things under good control unless he is able to control his edema much better. I discussed this again in great detail with him today. 01/12/2020 good news is patient actually appears to be doing quite well today at this point. He does have an appointment with lymphedema clinic tomorrow. His legs appear healed and the toe on the left is almost completely healed. In general I am very pleased with how things stand at this point. 01/19/2020 upon evaluation today patient appears to actually be doing well in regard to his lower extremities there is nothing open at this point. Fortunately he has done extremely well more recently. Has been seeing lymphedema clinic as well. With that being said he has Velcro wraps for his lower legs as well as his upper legs. The only wound really is on his toe which is the right great toe and this is  barely anything even there. With all that being said I think it is good to be appropriate today to go ahead and switch him over to the Velcro compression wraps. 01/26/2020 upon evaluation today patient appears to be doing worse with regard to his lower extremities after last week switch him to Velcro compression wraps. Unfortunately he lasted less than 24 hours he did not have the sock portion of his Velcro wrap on the left leg and subsequently developed a blister underneath the Velcro portion. Obviously this is not good and not what we were looking for at this point. He states the lymphedema clinic did tell him to wear the wrap for 23 hours and take him off for 1 I am okay with that plan but again right now we got a get things back under control again he may have some cellulitis noted as well. 02/02/2020 upon evaluation today patient unfortunately appears to have several areas of blistering on his bilateral lower extremities today mainly on the feet. His legs do seem to be doing somewhat better which is good news. Fortunately there is no evidence of active infection at this time. No fevers, chills, nausea, vomiting, or diarrhea. 02/16/2020 upon evaluation today patient appears to be doing well at this time with regard to his legs. He has a couple weeping areas on his toes but for the most part everything is doing better and does appear to be sealed up on his legs which is excellent news. We can continue with wrapping him at this point  as he had every time we discontinue the wraps he just breaks out with new wounds. There is really no point in is going forward with this at this point. 03/08/2020 upon evaluation today patient actually appears to be doing quite well with regard to his lower extremity ulcers. He has just a very superficial and really almost nonexistent blister on the left lower extremity he has in general done very well with the compression wraps. With that being said I do not see any signs  of infection at this time which is good news. 03/29/2020 upon evaluation today patient appears to be doing well with regard to his wounds currently except for where he had several new areas that opened up due to some of the wrap slipping and causing him trouble. He states he did not realize they had slipped. Nonetheless he has a 1 area on the right and 3 new areas on the left. Fortunately there is no signs of active infection at this time which is great news. 04/05/2020 upon evaluation today patient actually appears to be doing quite well in general in regard to his legs currently. Fortunately there is no signs of active infection at this time. No fevers, chills, nausea, vomiting, or diarrhea. He tells me next week that he will actually be seen in the lymphedema clinic on Thursday at 10 AM I see him on Wednesday next week. 04/12/2020 upon evaluation today patient appears to be doing very well with regard to his lower extremities bilaterally. In fact he does not appear to have any open wounds at this point which is good news. Fortunately there is no signs of active infection at this time. No fevers, chills, nausea, vomiting, or diarrhea. 04/19/2020 upon evaluation today patient appears to be doing well with regard to his wounds currently on the bilateral lower extremities. There does not appear to be any signs of active infection at this time. Fortunately there is no evidence of systemic infection and overall very pleased at this point. Nonetheless after I held him out last week he literally had blisters the next morning already which swelled up with him being right back here in the clinic. Overall I think that he is just not can be able to be discharged with his legs the way they are he is much to volume overloaded as far as fluid is concerned and that was discussed with him today of also discussed this but should try the clinic nurse manager as well as Dr. Leanord Hawking. 04/26/2020 upon evaluation today patient  appears to be doing better with regard to his wounds currently. He is making some progress and overall swelling is under good control with the compression wraps. Fortunately there is no evidence of active infection at this time. 05/10/2020 on evaluation today patient appears to be doing overall well in regard to his lower extremities bilaterally. He is Tolerating the compression wraps without complication and with what we are seeing currently I feel like that he is making excellent progress. There is no signs of active infection at this time. 05/24/2020 upon evaluation today patient appears to be doing well in regard to his legs. The swelling is actually quite a bit down compared to where it has been in the past. Fortunately there is no sign of active infection at this time which is also good news. With that being said he does have several wounds on his toes that have opened up at this point. 05/31/2020 upon evaluation today patient appears to be doing well  with regard to his legs bilaterally where he really has no significant fluid buildup at this point overall he seems to be doing quite well. Very pleased in this regard. With regard to his toes these also seem to be drying up which is excellent. We have continue to wrap him as every time we tried as a transition to the juxta light wraps things just do not seem to get any better. 06/07/2020 upon evaluation today patient appears to be doing well with regard to his right leg at this point. Unfortunately left leg has a lot of blistering he tells me the wrap started to slide down on him when he tried to put his other Velcro wrap over top of it to help keep things in order but nonetheless still had some issues. 06/14/2020 on evaluation today patient appears to be doing well with regard to his lower extremity ulcers and foot ulcers at this point. I feel like everything is actually showing signs of improvement which is great news overall there is no signs of active  infection at this time. No fevers, chills, nausea, vomiting, or diarrhea. 06/21/2020 on evaluation today patient actually appears to be doing okay in regard to his wounds in general. With that being said the biggest issue I see is on his right foot in particular the first and second toe seem to be doing a little worse due to the fact this is staying very wet. I think he is probably getting need to change out his dressings a couple times in between each week when we see him in regard to his toes in order to keep this drier based on the location and how this is proceeding. 06/28/2020 on evaluation today patient appears to be doing a little bit more poorly overall in regard to the appearance of the skin I am actually somewhat concerned about the possibility of him having a little bit of an infection here. We discussed the course of potentially giving him a doxycycline prescription which he is taken previously with good result. With that being said I do believe that this is potentially mild and at this point easily fixed. I just do not want anything to get any worse. 07/12/2020 upon evaluation today patient actually appears to be making some progress with regard to his legs which is great news there does not appear to be any evidence of active infection. Overall very pleased with where things stand. 07/26/2020 upon evaluation today patient appears to be doing well with regard to his leg ulcers and toe ulcers at this point. He has been tolerating the compression wraps without complication overall very pleased in this regard. 08/09/2020 upon evaluation today patient appears to be doing well with regard to his lower extremities bilaterally. Fortunately there is no signs of active infection overall I am pleased with where things stand. 08/23/2020 on evaluation today patient appears to be doing well with regard to his wound. He has been tolerating the dressing changes without complication. Fortunately there is no  signs of active infection at this time. Overall his legs seem to be doing quite well which is great news and I am very pleased in that regard. No fevers, chills, nausea, vomiting, or diarrhea. 09/13/2020 upon evaluation today patient appears to be doing okay in regard to his lower extremities. He does have a fairly large blister on the right leg which I did remove the blister tissue from today so we can get this to dry out other than that however he  seems to be doing quite well. There is no signs of active infection at this time. 09/27/2020 upon evaluation today patient appears to actually be doing some better in regard to his right leg. Fortunately signs of active infection at this time which is great news. No fevers, chills, nausea, vomiting, or diarrhea. 10/04/2020 upon evaluation today patient actually appears to be showing signs of improvement which is great news with regard to his leg ulcers. Fortunately there is no signs of active infection which is great news he is still taking the antibiotics currently. No fevers, chills, nausea, vomiting, or diarrhea. 10/18/2020 on evaluation today patient appears to be doing well with regard to his legs currently. He has been tolerating the dressing changes including the wraps without complication. Fortunately there is no signs of active infection at this time. No fevers, chills, nausea, vomiting, or diarrhea. 10/25/2020 upon evaluation today patient appears to be doing decently well in regard to his wounds currently. He has been tolerating the dressing changes without complication. Overall I feel like he is making good progress albeit slow. Again this is something we can have to continue to wrap for some time to come most likely. 11/08/2020 upon evaluation today patient appears to be doing well with regard to his wounds currently. He has been tolerating the dressing changes without complication is not currently on any antibiotics and he does not appear to  show any signs of infection. He does continue to have a lot of drainage on the right leg not too severe but nonetheless this is very scattered. On the left leg this is looking to be much improved overall. 11/15/2020 upon evaluation today patient appears to be doing better with regard to his legs bilaterally. Especially the right leg which was much more significant last week. There does not appear to be any signs of active infection which is great news. No fevers, chills, nausea, vomiting, or diarrhea. 11/23/2019 upon evaluation today patient appears to be doing poorly still in regard to his lower extremities bilaterally. Unfortunately his right leg in particular appears to be doing much more poorly there is no signs really of infection this is not warm to touch but he does have a lot of drainage and weeping unfortunately. With that reason I do believe that we may need to initiate some treatment here to try to help calm down some of the swelling of the right leg. I think switching to a 4-layer compression wrap would be beneficial here. The patient is in agreement with giving this a try. 11/29/2020 upon evaluation today patient appears to be doing well currently in regard to his leg ulcers. I feel like the right leg is doing better he still has a lot of drainage but we do see some improvement here. The 4-layer compression wrap I think was helpful. 12/06/2020 upon evaluation today patient appears to be doing well with regard to his legs. In fact they seem to be doing about the best I have seen up to this point. Fortunately there is no signs of active infection at this time. No fevers, chills, nausea, vomiting, or diarrhea. 12/20/2020 upon evaluation today patient appears to be doing well at this time in regard to his legs. He is not having any significant draining which is great news. Fortunately there is no signs of active infection at this time. No fevers, chills, nausea, vomiting, or diarrhea. 01/17/2021 upon  evaluation today [place] actually appears to be doing excellent in regard to his legs. He has a  few areas again that come and go as far as his toes are concerned but overall this is doing quite well. 01/31/2021 upon evaluation today patient appears to be doing well with regard to his legs. Fortunately there does not appear to be any signs of active infection which is great news. Overall he is still having significant edema despite the compression wraps basically the 4-layer compression wrap to just keep things under control there is really not much room for play. 4/13: Mr. Lamarque is a longstanding patient in our clinic and benefits greatly from weekly compression wraps. Today he has no complaints. He has been tolerating the wraps well. He states he is using the lymphedema pumps at home. 5/4; patient presents for follow-up of his chronic lymphedema/venous insufficiency ulcers. He comes weekly for compression wraps. He has no complaints today. He was unable to tolerate the Coflex 2 layer Last week so we will do the four press 4-layer compression. He has been using his lymphedema pumps daily. 5/18; patient presents for 2-week follow-up. He has no complaints or issues today. He has developed a new wound to the right foot on his fourth toe. He overall feels well and denies signs of infection. 6/1; patient presents for 2-week follow-up. He has no complaints or issues today. He denies signs of infection. 04/18/2021 upon evaluation today patient appears to be doing well with regard to his legs bilaterally. Family open wound is actually on the toe of his left foot everything else is completely closed which is great news. In general I am extremely pleased with where things stand at this point. The patient is also happy that things are doing so well. 05/02/2021 upon evaluation today patient's legs actually appear to be doing quite well today. Fortunately there does not appear to be any signs of active infection  which is great and overall I am extremely pleased with where he stands today. The patient does not appear to have any evidence of active infection at this time which is also great news. 05/09/2021 upon evaluation today patient appears to be doing a little bit more poorly in regard to his legs. Unfortunately he is having issues with some breakdown and a blood blister on the left leg this is due to I believe honestly to how it was wrapped last week. Fortunately there does not appear to be any signs of infection but nonetheless this is still a concern to be honest. No fevers, chills, nausea, vomiting, or diarrhea. 05/16/2021 upon evaluation today patient appears to be doing significantly better as compared to last week. I am very pleased with where things stand today. There does not appear to be any signs of infection which is great news and overall very pleased with where we stand. No fevers, chills, nausea, vomiting, or diarrhea. 05/30/2021 upon evaluation today patient appears to be doing well with regard to his legs. He has been tolerating the dressing changes without complication. Fortunately there does not appear to be any signs of active infection which is great news and overall I am extremely pleased with where things stand today. No fevers, chills, nausea, vomiting, or diarrhea. 06/20/2021 upon evaluation today patient actually appears to be making good progress today and very pleased with what we are seeing. I think his legs are really maintaining. As long as we continue wrapping he seems to be doing excellent in my opinion. Fortunately there is no signs of active infection at this time. No fevers, chills, nausea, vomiting, or diarrhea. 07/11/2021 upon evaluation  today patient actually appears to be making excellent progress at this time. Fortunately there does not appear to be any evidence of active infection which is great news and overall I am extremely pleased with where things stand today. No  fevers, chills, nausea, vomiting, or diarrhea. Electronic Signature(s) Signed: 07/11/2021 1:13:16 PM By: Lenda Kelp PA-C Entered By: Lenda Kelp on 07/11/2021 13:13:16 -------------------------------------------------------------------------------- Physical Exam Details Patient Name: Date of Service: [name], Kebron J. 07/11/2021 10:30 A M Medical Record Number: [id number] Patient Account Number: [id number] Date of Birth/Sex: Treating RN: [birth year] (70 y.o. [name] Primary Care Provider: Nicoletta Ba Other Clinician: Referring Provider: Treating Provider/Extender: Adele Dan in Treatment: 285 Constitutional Obese and well-hydrated in no acute distress. Respiratory normal breathing without difficulty. Psychiatric this patient is able to make decisions and demonstrates good insight into disease process. Alert and Oriented x 3. pleasant and cooperative. Notes Upon inspection patient's wound bed actually showed signs of good granulation and epithelization at this point. Fortunately there does not appear to be any signs of active infection systemically which is great news and overall I am extremely pleased with where we stand today. The patient is likewise happy that things are doing so well. Electronic Signature(s) Signed: 07/11/2021 1:13:49 PM By: Lenda Kelp PA-C Entered By: Lenda Kelp on 07/11/2021 13:13:49 -------------------------------------------------------------------------------- Physician Orders Details Patient Name: Date of Service: [name], Quintarius J. 07/11/2021 10:30 A M Medical Record Number: [id number] Patient Account Number: [id number] Date of Birth/Sex: Treating RN: April 13, 1951 (70 y.o. [name] Primary Care Provider: Nicoletta Ba Other Clinician: Referring Provider: Treating Provider/Extender: Adele Dan in Treatment: [phone] Verbal / Phone Orders: No Diagnosis Coding ICD-10  Coding Code Description L97.521 Non-pressure chronic ulcer of other 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 of skin L97.829 Non-pressure chronic ulcer of other part of left lower leg with unspecified severity L97.519 Non-pressure chronic ulcer of other part of right foot with unspecified severity I87.333 Chronic venous hypertension (idiopathic) with ulcer and inflammation of bilateral lower extremity I89.0 Lymphedema, not elsewhere classified E11.622 Type 2 diabetes mellitus with other skin ulcer E11.40 Type 2 diabetes mellitus with diabetic neuropathy, unspecified Follow-up Appointments Return appointment in 3 weeks. - with Leonard Schwartz 9/28 Nurse Visit: - one week 9/14 Bathing/ Shower/ Hygiene May shower with protection but do not get wound dressing(s) wet. Edema Control - Lymphedema / SCD / Other Bilateral Lower Extremities Lymphedema Pumps. Use Lymphedema pumps on leg(s) 2-3 times a day for 45-60 minutes. If wearing any wraps or hose, do not remove them. Continue exercising as instructed. Elevate legs to the level of the heart or above for 30 minutes daily and/or when sitting, a frequency of: - throughout the day Avoid standing for long periods of time. Exercise regularly Non Wound Condition Bilateral Lower Extremities Other Non Wound Condition Orders/Instructions: - lotion to both legs, 4 layer compression wraps both legs, silver alginate to any weeping areas Wound Treatment Wound #193 - T Second oe Wound Laterality: Left Cleanser: Soap and Water 1 x Per Week/15 Days Discharge Instructions: May shower and wash wound with dial antibacterial soap and water prior to dressing change. Cleanser: Wound Cleanser 1 x Per Week/15 Days Discharge Instructions: Cleanse the wound with wound cleanser prior to applying a clean dressing using gauze sponges, not tissue or cotton balls. Prim Dressing: Maxorb Extra Calcium Alginate 2x2 in 1 x Per Week/15 Days ary Discharge Instructions: Apply calcium  alginate to wound bed  as instructed Secondary Dressing: Woven Gauze Sponges 2x2 in 1 x Per Week/15 Days Discharge Instructions: Apply over primary dressing as directed. Secured With: Insurance underwriter, Sterile 2x75 (in/in) 1 x Per Week/15 Days Discharge Instructions: Secure with stretch gauze as directed. Electronic Signature(s) Signed: 07/11/2021 6:00:14 PM By: Lenda Kelp PA-C Signed: 07/11/2021 6:09:03 PM By: Zenaida Deed RN, BSN Entered By: Zenaida Deed on 07/11/2021 11:17:27 -------------------------------------------------------------------------------- Problem List Details Patient Name: Date of Service: [name], Larkin J. 07/11/2021 10:30 A M Medical Record Number: [id number] Patient Account Number: [id number] Date of Birth/Sex: Treating RN: 10-27-1951 (70 y.o. [name] Primary Care Provider: Nicoletta Ba Other Clinician: Referring Provider: Treating Provider/Extender: Adele Dan in Treatment: 285 Active Problems ICD-10 Encounter Code Description Active Date MDM Diagnosis L97.521 Non-pressure chronic ulcer of other 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 of 04/27/2018 No Yes skin L97.829 Non-pressure chronic ulcer of other part of left lower leg with unspecified 03/21/2021 No Yes severity L97.519 Non-pressure chronic ulcer of other part of right foot with unspecified severity 03/21/2021 No Yes I87.333 Chronic venous hypertension (idiopathic) with ulcer and inflammation of 01/22/2016 No Yes bilateral lower extremity I89.0 Lymphedema, not elsewhere classified 01/22/2016 No Yes E11.622 Type 2 diabetes mellitus with other skin ulcer 01/22/2016 No Yes E11.40 Type 2 diabetes mellitus with diabetic neuropathy, unspecified 01/22/2016 No Yes Inactive Problems ICD-10 Code Description Active Date Inactive Date L03.115 Cellulitis of right lower limb 12/22/2017 12/22/2017 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 with other  specified severity 06/30/2018 06/30/2018 L97.511 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 of skin 06/30/2018 06/30/2018 Resolved Problems ICD-10 Code Description Active Date Resolved Date L97.211 Non-pressure chronic ulcer of right calf limited to breakdown of skin 06/30/2018 06/30/2018 L97.221 Non-pressure chronic ulcer of left calf limited to breakdown of skin 09/30/2016 09/30/2016 L03.116 Cellulitis of left lower limb 04/01/2017 04/01/2017 L97.211 Non-pressure chronic ulcer of right calf limited to breakdown of skin 06/30/2017 06/30/2017 Electronic Signature(s) Signed: 07/11/2021 10:40:22 AM By: Lenda Kelp PA-C Entered By: Lenda Kelp on 07/11/2021 10:40:21 -------------------------------------------------------------------------------- Progress Note Details Patient Name: Date of Service: [name], Thang J. 07/11/2021 10:30 A M Medical Record Number: [id number] Patient Account Number: [id number] Date of Birth/Sex: Treating RN: [birth year] (70 y.o. [name] Primary Care Provider: Nicoletta Ba Other Clinician: Referring Provider: Treating Provider/Extender: Adele Dan in Treatment: 285 Subjective Chief Complaint Information obtained from Patient patient is here for evaluation of venous/lymphedema ulcers History of Present Illness (HPI) Referred by PCP for consultation. Patient has long standing history of BLE venous stasis, no prior ulcerations. At beginning of month, developed cellulitis and weeping. Received IM Rocephin followed by Keflex and resolved. Wears compression stocking, appr 6 months old. Not sure strength. No present drainage. 01/22/16 this is a patient who is a type II diabetic on insulin. 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 He tells me he religiously wears pressure stockings of uncertain strength. He was here with weeping edema about 8 months ago but did not have an  open wound. Roughly a month ago he had a reopening on his bilateral legs. He is been using bandages and Neosporin. He does not complain of pain. 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 Last BUN/creatinine I have is from 11/20/15 at 13 and 1.0 respectively  01/29/16; patient arrives today having tolerated the Profore wrap. He brought in his stockings and these are 18 mmHg stockings he bought from [place]. The compression here is likely inadequate. He does not complain of pain or excessive drainage she has no systemic symptoms. The wound on the right looks improved as does the one on the left although one on the left is more substantial with still tissue at risk below the actual wound area on the bilateral posterior calf 02/05/16; patient arrives with poor edema control. He states that we did put a 4 layer compression on it last week. No weight appear 5 this. 02/12/16; the area on the posterior right Has healed. The left Has a substantial wound that has necrotic surface eschar that requires a debridement with a curette. 02/16/16;the patient called or a Nurse visit secondary to increased swelling. He had been in earlier in the week with his right leg healed. He was transitioned to is on pressure stocking on the right leg with the only open wound on the left, a substantial area on the left posterior calf. Note he has a history of severe lower extremity edema, he has a history of chronic atrial fibrillation but not heart failure per my notes but I'll need to research this. He is not complaining of chest pain shortness of breath or orthopnea. The intake nurse noted blisters on the previously closed right leg 02/19/16; this is the patient's regular visit day. I see him on Friday with escalating edema new wounds on the right leg and clear signs of at least right ventricular heart failure. I increased his  Lasix to 40 twice a day. He is returning currently in follow-up. States he is noticed a decrease in that the edema 02/26/16 patient's legs have much less edema. There is nothing really open on the right leg. The left leg has improved condition of the large superficial wound on the posterior left leg 03/04/16; edema control is very much better. The patient's right leg wounds have healed. On the left leg he continues to have severe venous inflammation on the posterior aspect of the left leg. There is no tenderness and I don't think any of this is cellulitis. 03/11/16; patient's right leg is married healed and he is in his own stocking. The patient's left leg has deteriorated somewhat. There is a lot of erythema around the wound on the posterior left leg. There is also a significant rim of erythema posteriorly just above where the wrap would've ended there is a new wound in this location and a lot of tenderness. Can't rule out cellulitis in this area. 03/15/16; patient's right leg remains healed and he is in his own stocking. The patient's left leg is much better than last review. His major wound on the posterior aspect of his left Is almost fully epithelialized. He has 3 small injuries from the wraps. Really. Erythema seems a lot better on antibiotics 03/18/16; right leg remains healed and he is in his own stocking. The patient's left leg is much better. The area on the posterior aspect of the left calf is fully epithelialized. His 3 small injuries which were wrap injuries on the left are improved only one seems still open his erythema has resolved 03/25/16; patient's right leg remains healed and he is in his own stocking. There is no open area today on the left leg posterior leg is completely closed up. His wrap injuries at the superior aspect of his leg are also resolved. He looks as though  he has some irritation on the dorsal ankle but this is fully epithelialized without evidence of infection. 03/28/16; we  discharged this patient on Monday. Transitioned him into his own stocking. There were problems almost immediately with uncontrolled swelling weeping edema multiple some of which have opened. He does not feel systemically unwell in particular no chest pain no shortness of breath and he does not feel 04/08/16; the edema is under better control with the Profore light wrap but he still has pitting edema. There is one large wound anteriorly 2 on the medial aspect of his left leg and 3 small areas on the superior posterior calf. Drainage is not excessive he is tolerating a Profore light well 04/15/16; put a Profore wrap on him last week. This is controlled is edema however he had a lot of pain on his left anterior foot most of his wounds are healed 04/22/16 once again the patient has denuded areas on the left anterior foot which he states are because his wrap slips up word. He saw his primary physician today is on Lasix 40 twice a day and states that he his weight is down 20 pounds over the last 3 months. 04/29/16: Much improved. left anterior foot much improved. He is now on Lasix 80 mg per day. Much improved edema control 05/06/16; I was hoping to be able to discharge him today however once again he has blisters at a low level of where the compression was placed last week mostly on his left lateral but also his left medial leg and a small area on the anterior part of the left foot. 05/09/16; apparently the patient went home after his appointment on 7/4 later in the evening developing pain in his upper medial thigh together with subjective fever and chills although his temperature was not taken. The pain was so intense he felt he would probably have to call 911. However he then remembered that he had leftover doxycycline from a previous round of antibiotics and took these. By the next morning he felt a lot better. He called and spoke to one of our nurses and I approved doxycycline over the phone thinking that this  was in relation to the wounds we had previously seen although they were definitely were not. The patient feels a lot better old fever no chills he is still working. Blood sugars are reasonably controlled 05/13/16; patient is back in for review of his cellulitis on his anterior medial upper thigh. He is taking doxycycline this is a lot better. Culture I did of the nodular area on the dorsal aspect of his foot grew MRSA this also looks a lot better. 05/20/16; the patient is cellulitis on the medial upper thigh has resolved. All of his wound areas including the left anterior foot, areas on the medial aspect of the left calf and the lateral aspect of the calf at all resolved. He has a new blister on the left dorsal foot at the level of the fourth toe this was excised. No evidence of infection 05/27/16; patient continues to complain weeping edema. He has new blisterlike wounds on the left anterior lateral and posterior lateral calf at the top of his wrap levels. The area on his left anterior foot appears better. He is not complaining of fever, pain or pruritus in his feet. 05/30/16; the patient's blisters on his left anterior leg posterior calf all look improved. He did not increase the Lasix 100 mg as I suggested because he was going to run out of  his 40 mg tablets. He is still having weeping edema of his toes 06/03/16; I renewed his Lasix at 80 mg once a day as he was about to run out when I last saw him. He is on 80 mg of Lasix now. I have asked him to cut down on the excessive amount of water he was drinking and asked him to drink according to his thirst mechanisms 06/12/2016 -- was seen 2 days ago and was supposed to wear his compression stockings at home but he is developed lymphedema and superficial blisters on the left lower extremity and hence came in for a review 06/24/16; the remaining wound is on his left anterior leg. He still has edema coming from between his toes. There is lymphedema here however  his edema is generally better than when I last saw this. He has a history of atrial fibrillation but does not have a known history of congestive heart failure nevertheless I think he probably has this at least on a diastolic basis. 07/01/16 I reviewed his echocardiogram from January 2017. This was essentially normal. He did not have LVH, EF of 55-60%. His right ventricular function was normal although he did have trivial tricuspid and pulmonic regurgitation. This is not audible on exam however. I increased his Lasix to do massive edema in his legs well above his knees I think in early July. He was also drinking an excessive amount of water at the time. 07/15/16; missed his appointment last week because of the Labor Day holiday on Monday. He could not get another appointment later in the week. Started to feel the wrap digging in superiorly so we remove the top half and the bottom half of his wrap. He has extensive erythema and blistering superiorly in the left leg. Very tender. Very swollen. Edema in his foot with leaking edema fluid. He has not been systemically unwell 07/22/16; the area on the left leg laterally required some debridement. The medial wounds look more stable. His wrap injury wounds appear to have healed. Edema and his foot is better, weeping edema is also better. He tells me he is meeting with the supplier of the external compression pumps at work 08/05/16; the patient was on vacation last week in [facility]. His wrap is been on for an extended period of time. Also over the weekend he developed an extensive area of tender erythema across his anterior medial thigh. He took to doxycycline yesterday that he had leftover from a previous prescription. The patient complains of weeping edema coming out of his toes 08/08/16; I saw this patient on 10/2. He was tender across his anterior thigh. I put him on doxycycline. He returns today in follow-up. He does not have any open wounds on his lower  leg, he still has edema weeping into his toes. 08/12/16; patient was seen back urgently today to follow-up for his extensive left thigh cellulitis/erysipelas. He comes back with a lot less swelling and erythema pain is much better. I believe I gave him Augmentin and Cipro. His wrap was cut down as he stated a roll down his legs. He developed blistering above the level of the wrap that remained. He has 2 open blisters and 1 intact. 08/19/16; patient is been doing his primary doctor who is increased his Lasix from 40-80 once a day or 80 already has less edema. Cellulitis has remained improved in the left thigh. 2 open areas on the posterior left calf 08/26/16; he returns today having new open blisters on the  anterior part of his left leg. He has his compression pumps but is not yet been shown how to use some vital representative from the supplier. 09/02/16 patient returns today with no open wounds on the left leg. Some maceration in his plantar toes 09/10/2016 -- Dr. Leanord Hawking had recently discharged him on 09/02/2016 and he has come right back with redness swelling and some open ulcers on his left lower extremity. He says this was caused by trying to apply his compression stockings and he's been unable to use this and has not been able to use his lymphedema pumps. He had some doxycycline leftover and he has started on this a few days ago. 09/16/16; there are no open wounds on his leg on the left and no evidence of cellulitis. 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 He does not complain of symptoms here. I am not clear use using his external compression pumps. 09/23/16; I have not seen this patient in 2 weeks. He canceled his appointment 10 days ago as he was going on vacation. He tells me that on Monday he noticed a large area on his posterior left leg which is been draining copiously and is reopened into a large wound. He is been using ABDs and  the external part of his juxtalite, according to our nurse this was not on properly. 10/07/16; Still a substantial area on the posterior left leg. Using silver alginate 10/14/16; in general better although there is still open area which looks healthy. Still using silver alginate. He reminds me that this happen before he left for [facility]. T oday while he was showering in the morning. He had been using his juxtalite's 10/21/16; the area on his posterior left leg is fully epithelialized. However he arrives today with a large area of tender erythema in his medial and posterior left thigh just above the knee. I have marked the area. Once again he is reluctant to consider hospitalization. I treated him with oral antibiotics in the past for a similar situation with resolution I think with doxycycline however this area it seems more extensive to me. He is not complaining of fever but does have chills and says states he is thirsty. His blood sugar today was in the 140s at home 10/25/16 the area on his posterior left leg is fully epithelialized although there is still some weeping edema. The large area of tenderness and erythema in his medial and posterior left thigh is a lot less tender although there is still a lot of swelling in this thigh. He states he feels a lot better. He is on doxycycline and Augmentin that I started last week. This will continued until Tuesday, December 26. I have ordered a duplex ultrasound of the left thigh rule out DVT whether there is an abscess something that would need to be drained I would also like to know. 11/01/16; he still has weeping edema from a not fully epithelialized area on his left posterior calf. Most of the rest of this looks a lot better. He has completed his antibiotics. His thigh is a lot better. Duplex ultrasound did not show a DVT in the thigh 11/08/16; he comes in today with more Denuded surface epithelium from the posterior aspect of his calf. There is no  real evidence of cellulitis. The superior aspect of his wrap appears to have put quite an indentation in his leg just below the knee and this may have contributed. He does  not complain of pain or fever. We have been using silver alginate as the primary dressing. The area of cellulitis in the right thigh has totally resolved. He has been using his compression stockings once a week 11/15/16; the patient arrives today with more loss of epithelium from the posterior aspect of his left calf. He now has a fairly substantial wound in this area. The reason behind this deterioration isn't exactly clear although his edema is not well controlled. He states he feels he is generally more swollen systemically. He is not complaining of chest pain shortness of breath fever. T me he has an appointment with his primary physician in early February. He is on 80 mg of oral ells Lasix a day. He claims compliance with the external compression pumps. He is not having any pain in his legs similar to what he has with his recurrent cellulitis 11/22/16; the patient arrives a follow-up of his large area on his left lateral calf. This looks somewhat better today. He came in earlier in the week for a dressing change since I saw him a week ago. He is not complaining of any pain no shortness of breath no chest pain 11/28/16; the patient arrives for follow-up of his large area on the left lateral calf this does not look better. In fact it is larger weeping edema. The surface of the wound does not look too bad. We have been using silver alginate although I'm not certain that this is a dressing issue. 12/05/16; again the patient follows up for a large wound on the left lateral and left posterior calf this does not look better. There continues to be weeping edema necrotic surface tissue. More worrisome than this once again there is erythema below the wound involving the distal Achilles and heel suggestive of cellulitis. He is on his feet  working most of the day of this is not going well. We are changing his dressing twice a week to facilitate the drainage. 12/12/16; not much change in the overall dimensions of the large area on the left posterior calf. This is very inflamed looking. I gave him an. Doxycycline last week does not really seem to have helped. He found the wrap very painful indeed it seems to of dog into his legs superiorly and perhaps around the heel. He came in early today because the drainage had soaked through his dressings. 12/19/16- patient arrives for follow-up evaluation of his left lower extremity ulcers. He states that he is using his lymphedema pumps once daily when there is "no drainage". He admits to not using his lipedema pumps while under current treatment. His blood sugars have been consistently between 150-200. 12/26/16; the patient is not using his compression pumps at home because of the wetness on his feet. I've advised him that I think it's important for him to use this daily. He finds his feet too wet, he can put a plastic bag over his legs while he is in the pumps. Otherwise I think will be in a vicious circle. We are using silver alginate to the major area on his left posterior calf 01/02/17; the patient's posterior left leg has further of all into 3 open wounds. All of them covered with a necrotic surface. He claims to be using his compression pumps once a day. His edema control is marginal. Continue with silver alginate 01/10/17; the patient's left posterior leg actually looks somewhat better. There is less edema, less erythema. Still has 3 open areas covered with a necrotic surface  requiring debridement. He claims to be using his compression pumps once a day his edema control is better 01/17/17; the patient's left posterior calf look better last week when I saw him and his wrap was changed 2 days ago. He has noted increasing pain in the left heel and arrives today with much larger wounds extensive erythema  extending down into the entire heel area especially tender medially. He is not systemically unwell CBGs have been controlled no fever. Our intake nurse showed me limegreen drainage on his AVD pads. 01/24/17; his usual this patient responds nicely to antibiotics last week giving him Levaquin for presumed Pseudomonas. The whole entire posterior part of his leg is much better much less inflamed and in the case of his Achilles heel area much less tender. He has also had some epithelialization posteriorly there are still open areas here and still draining but overall considerably better 01/31/17- He has continue to tolerate the compression wraps. he states that he continues to use the lymphedema pumps daily, and can increase to twice daily on the weekends. He is voicing no complaints or concerns regarding his LLE ulcers 02/07/17-he is here for follow-up evaluation. He states that he noted some erythema to the left medial and anterior thigh, which he states is new as of yesterday. He is concerned about recurrent cellulitis. He states his blood sugars have been slightly elevated, this morning in the 180s 02/14/17; he is here for follow-up evaluation. When he was last here there was erythema superiorly from his posterior wound in his anterior thigh. He was prescribed Levaquin however a culture of the wound surface grew MRSA over the phone I changed him to doxycycline on Monday and things seem to be a lot better. 02/24/17; patient missed his appointment on Friday therefore we changed his nurse visit into a physician visit today. Still using silver alginate on the large area of the posterior left thigh. He isn't new area on the dorsal left second toe 03/03/17; actually better today although he admits he has not used his external compression pumps in the last 2 days or so because of work responsibilities over the weekend. 03/10/17; continued improvement. External compression pumps once a day almost all of his wounds  have closed on the posterior left calf. Better edema control 03/17/17; in general improved. He still has 3 small open areas on the lateral aspect of his left leg however most of the area on the posterior part of his leg is epithelialized. He has better edema control. He has an ABD pad under his stocking on the right anterior lower leg although he did not let us look at that today. 03/24/17; patient arrives back in clinic today with no open areas however there are areas on the posterior left calf and anterior left calf that are less than 100% epithelialized. His edema is well controlled in the left lower leg. There is some pitting edema probably lymphedema in the left upper thigh. He uses compression pumps at home once per day. I tried to get him to do this twice a day although he is very reticent. 04/01/2017 -- for the last 2 days he's had significant redness, tenderness and weeping and came in for an urgent visit today. 04/07/17; patient still has 6 more days of doxycycline. He was seen by Dr. Meyer RusselBritto last Wednesday for cellulitis involving the posterior aspect, lateral aspect of his Involving his heel. For the most part he is better there is less erythema and less weeping. He has been  on his feet for 12 hours o2 over the weekend. Using his compression pumps once a day 04/14/17 arrives today with continued improvement. Only one area on the posterior left calf that is not fully epithelialized. He has intense bilateral venous inflammation associated with his chronic venous insufficiency disease and secondary lymphedema. We have been using silver alginate to the left posterior calf wound In passing he tells [place] today that the right leg but we have not seen in quite some time has an open area on it but he doesn't want [place] to look at this today states he will show this to [place] next week. 04/21/17; there is no open area on his left leg although he still reports some weeping edema. He showed [place] his right leg today  which is the first time we've seen this leg in a long time. He has a large area of open wound on the right leg anteriorly healthy granulation. Quite a bit of swelling in the right leg and some degree of venous inflammation. He told us about the right leg in passing last week but states that deterioration in the right leg really only happened over the weekend 04/28/17; there is no open area on the left leg although there is an irritated part on the posterior which is like a wrap injury. The wound on the right leg which was new from last week at least to [place] is a lot better. 05/05/17; still no open area on the left leg. Patient is using his new compression stocking which seems to be doing a good job of controlling the edema. He states he is using his compression pumps once per day. The right leg still has an open wound although it is better in terms of surface area. Required debridement. A lot of pain in the posterior right Achilles marked tenderness. Usually this type of presentation this patient gives concern for an active cellulitis 05/12/17; patient arrives today with his major wound from last week on the right lateral leg somewhat better. Still requiring debridement. He was using his compression stocking on the left leg however that is reopened with superficial wounds anteriorly he did not have an open wound on this leg previously. He is still using his juxta light's once daily at night. He cannot find the time to do this in the morning as he has to be at work by 7 AM 05/19/17; right lateral leg wound looks improved. No debridement required. The concerning area is on the left posterior leg which appears to almost have a subcutaneous hemorrhagic component to it. We've been using silver alginate to all the wounds 05/26/17; the right lateral leg wound continues to look improved. However the area on the left posterior calf is a tightly adherent surface. [place] using silver alginate. Because of the weeping  edema in his legs there is very little good alternatives. 06/02/17; the patient left here last week looking quite good. Major wound on the left posterior calf and a small one on the right lateral calf. Both of these look satisfactory. He tells me that by Wednesday he had noted increased pain in the left leg and drainage. He called on Thursday and Friday to get an appointment here but we were blocked. He did not go to urgent care or his primary physician. He thinks he had a fever on Thursday but did not actually take his temperature. He has not been using his compression pumps on the left leg because of pain. I advised him to go  to the emergency room today for IV antibiotics for stents of left leg cellulitis but he has refused I have asked him to take 2 days off work to keep his leg elevated and he has refused this as well. In view of this I'm going to call him and Augmentin and doxycycline. 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 However after a detailed discussion regarding the risk benefits and alternatives he refuses to get admitted to the hospital. With no other recourse I will continue him on oral antibiotics as before and hopefully he'll have his infectious disease consultation this week. 06/16/2017 -- the patient was seen today by the nurse practitioner at infectious disease Ms. Dixon. 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 He has also been advise Lotrimin cream locally. she also had wise IV antibiotics for his condition if it flares up 06/23/17; patient arrives today with  drainage bilaterally although the remaining wound on the left posterior calf after cleaning up today "highlighter yellow drainage" did not look too bad. Unfortunately he has had breakdown on the right anterior leg [previously this leg had not been open and he is using a black stocking] he went to see infectious disease and is been put on clindamycin 150 mg daily, I did not verify the dose although I'm not familiar with using clindamycin in this dosing range, perhaps for prophylaxisoo 06/27/17; I brought this patient back today to follow-up on the wound deterioration on the right lower leg together with surrounding cellulitis. I started him on doxycycline 4 days ago. This area looks better however he comes in today with intense cellulitis on the medial part of his left thigh. This is not have a wound in this area. Extremely tender. We've been using silver alginate to the wounds on the right lower leg left lower leg with bilateral 4 layer compression he is using his external compression pumps once a day 07/04/17; patient's left medial thigh cellulitis looks better. He has not been using his compression pumps as his insert said it was contraindicated with cellulitis. His right leg continues to make improvements all the wounds are still open. We only have one remaining wound on the left posterior calf. Using silver alginate to all open areas. He is on doxycycline which I started a week ago and should be finishing I gave him Augmentin after Thursday's visit for the severe cellulitis on the left medial thigh which fortunately looks better 07/14/17; the patient's left medial thigh cellulitis has resolved. The cellulitis in his right lower calf on the right also looks better. All of his wounds are stable to improved we've been using silver alginate he has completed the antibiotics I have given him. He has clindamycin 150 mg once a day prescribed by infectious disease for prophylaxis, I've advised him to start  this now. We have been using bilateral Unna boots over silver alginate to the wound areas 07/21/17; the patient is been to see infectious disease who noted his recurrent problems with cellulitis. He was not able to tolerate prophylactic clindamycin therefore he is on  amoxicillin 500 twice a day. He also had a second daily dose of Lasix added By Dr. Oneta Rack but he is not taking this. Nor is he being completely compliant with his compression pumps a especially not this week. He has 2 remaining wounds one on the right posterior lateral lower leg and one on the left posterior medial lower leg. 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 The patient has Unna boots bilaterally. 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 He has bilateral Unna boots for compression and silver alginate to his wounds. Arrives today with his legs looking as good as I have seen him in quite some time. Not surprisingly his wounds look better as well with improvement on the right lateral leg venous insufficiency wound and also the left medial leg. He is still using the compression pumps once a day 08/11/17; both legs appear to be doing better wounds on the right lateral and left medial legs look better. Skin on the right leg quite good. He is been using silver alginate as the primary dressing. I'm going to use Anasept gel calcium alginate and maintain all the secondary dressings 08/18/17; the patient continues to actually do quite well. The area on his right lateral leg is just about closed the left medial also looks better although it is still moist in this area. His edema is well controlled we have been using Anasept gel with calcium alginate and the usual secondary dressings, 4 layer compression and once daily  use of his compression pumps "always been able to manage 09/01/17; the patient continues to do reasonably well in spite of his trip to [place]. The area on the right lateral leg is epithelialized. Left is much better but still open. He has more edema and more chronic erythema on the left leg [venous inflammation] 09/08/17; he arrives today with no open wound on the right lateral leg and decently controlled edema. Unfortunately his left leg is not nearly as in his good situation as last week.he apparently had increasing edema starting on Saturday. He edema soaked through into his foot so used a plastic bag to walk around his home. The area on the medial right leg which was his open area is about the same however he has lost surface epithelium on the left lateral which is new and he has significant pain in the Achilles area of the left foot. He is already on amoxicillin chronically for prophylaxis of cellulitis in the left leg 09/15/17; he is completed a week of doxycycline and the cellulitis in the left posterior leg and Achilles area is as usual improved. He still has a lot of edema and fluid soaking through his dressings. There is no open wound on the right leg. He saw infectious disease NP today 09/22/17;As usual 1 we transition him from our compression wraps to his stockings things did not go well. He has several small open areas on the right leg. He states this was caused by the compression wrap on his skin although he did not wear this with the stockings over them. He has several superficial areas on the left leg medially laterally posteriorly. He does not have any evidence of active cellulitis especially involving the left Achilles The patient is traveling from [facility] Saturday going to [facility]. He  states he isn't attempting to get an appointment with a heel objects wound center there to change his dressings. I am not completely certain whether this will work 10/06/17; the patient came  in on Friday for a nurse visit and the nurse reported that his legs actually look quite good. He arrives in clinic today for his regular follow-up visit. He has a new wound on his left third toe over the PIP probably caused by friction with his footwear. He has small areas on the left leg and a very superficial but epithelialized area on the right anterior lateral lower leg. Other than that his legs look as good as I've seen him in quite some time. We have been using silver alginate Review of systems; no chest pain no shortness of breath other than this a 10 point review of systems negative 10/20/17; seen by Dr. Meyer Russel last week. He had taken some antibiotics [doxycycline] that he had left over. Dr. Meyer Russel thought he had candida infection and declined to give him further antibiotics. He has a small wound remaining on the right lateral leg several areas on the left leg including a larger area on the left posterior several left medial and anterior and a small wound on the left lateral. The area on the left dorsal third toe looks a lot better. ROS; Gen.; no fever, respiratory no cough no sputum Cardiac no chest pain other than this 10 point review of system is negative 10/30/17; patient arrives today having fallen in the bathtub 3 days ago. It took him a while to get up. He has pain and maceration in the wounds on his left leg which have deteriorated. He has not been using his pumps he also has some maceration on the right lateral leg. 11/03/17; patient continues to have weeping edema especially in the left leg. This saturates his dressings which were just put on on 12/27. As usual the doxycycline seems to take care of the cellulitis on his lower leg. He is not complaining of fever, chills, or other systemic symptoms. He states his leg feels a lot better on the doxycycline I gave him empirically. He also apparently gets injections at his primary doctor's officeo Rocephin for cellulitis prophylaxis.  I didn't ask him about his compression pump compliance today I think that's probably marginal. Arrives in the clinic with all of his dressings primary and secondary macerated full of fluid and he has bilateral edema 11/10/17; the patient's right leg looks some better although there is still a cluster of wounds on the right lateral. The left leg is inflamed with almost circumferential skin loss medially to laterally although we are still maintaining anteriorly. He does not have overt cellulitis there is a lot of drainage. He is not using compression pumps. We have been using silver alginate to the wound areas, there are not a lot of options here 11/17/17; the patient's right leg continues to be stable although there is still open wounds, better than last week. The inflammation in the left leg is better. Still loss of surface layer epithelium especially posteriorly. There is no overt cellulitis in the amount of edema and his left leg is really quite good, tells me he is using his compression pumps once a day. 11/24/17; patient's right leg has a small superficial wound laterally this continues to improve. The inflammation in the left leg is still improving however we have continuous surface layer epithelial loss posteriorly. There is no overt cellulitis in the amount of edema in both  legs is really quite good. He states he is using his compression pumps on the left leg once a day for 5 out of 7 days 12/01/17; very small superficial areas on the right lateral leg continue to improve. Edema control in both legs is better today. He has continued loss of surface epithelialization and left posterior calf although I think this is better. We have been using silver alginate with large number of absorptive secondary dressings 4 layer on the left Unna boot on the right at his request. He tells me he is using his compression pumps once a day 12/08/17; he has no open area on the right leg is edema control is good  here. ooOn the left leg however he has marked erythema and tenderness breakdown of skin. He has what appears to be a wrap injury just distal to the popliteal fossa. 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 There was swelling erythema and breakdown in the right leg. He has much worse skin breakdown in the left leg as well multiple open areas medially and posteriorly as well as laterally. He tells me he has been using his compression pumps but tells me he feels that the drainage out of his leg is worse when he uses a compression pumps. T be fair to him he is been saying this o for a while however I don't know that I have really been listening to this. I wonder if the compression pumps are working properly 12/22/17;. Once again he arrives with severe erythema, weeping edema from the left greater than right leg. Noncompliance with compression pumps. New this visit he is complaining of pain on the lateral aspect of the right leg and the medial aspect of his right thigh. He apparently saw his cardiologist Dr. [name] who was ordered an echocardiogram area and I think this is a step in the right direction 12/25/17; started his doxycycline Monday night. There is still intense erythema of the right leg especially in the anterior thigh although there is less tenderness. The erythema around the wound on the right lateral calf also is less tender. He still complaining of pain in the left heel. His wounds are about the same right lateral left medial left lateral. Superficial but certainly not close to closure. He denies being systemically unwell no fever chills no abdominal pain no diarrhea 12/29/17; back in follow-up of his extensive right  calf and right thigh cellulitis. I added amoxicillin to cover possible doxycycline resistant strep. This seems to of done the trick he is in much less pain there is much less erythema and swelling. He has his echocardiogram at 11:00 this morning. X-ray of the left heel was also negative. 01/05/18; the patient arrived with his edema under much better control. Now that he is retired he is able to use his compression pumps daily and sometimes twice a day per the patient. He has a wound on the right leg the lateral wound looks better. Area on the left leg also looks a lot better. He has no evidence of cellulitis in his bilateral thighs I had a quick peak at his echocardiogram. He is in normal ejection fraction and normal left ventricular function. He has moderate pulmonary hypertension moderately reduced right  ventricular function. One would have to wonder about chronic sleep apnea although he says he doesn't snore. He'll review the echocardiogram with his cardiologist. 01/12/18; the patient arrives with the edema in both legs under exemplary control. He is using his compression pumps daily and sometimes twice daily. His wound on the right lateral leg is just about closed. He still has some weeping areas on the posterior left calf and lateral left calf although everything is just about closed here as well. I have spoken with Aldean Baker who is the patient's nurse practitioner and infectious disease. She was concerned that the patient had not understood that the parenteral penicillin injections he was receiving for cellulitis prophylaxis was actually benefiting him. I don't think the patient actually saw that I would tend to agree we were certainly dealing with less infections although he had a serious one last month. 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 He is healed. He'll be placed in juxtalite compression wraps and increase his lymphedema pumps to twice daily.  We will follow up again next week to ensure there are no issues with the new regiment. 01/20/18-he is here for evaluation of bilateral lower extremity weeping edema. Yesterday he was placed in compression wrap to the right lower extremity and compression stocking to left lower shrubbery. He states he uses lymphedema pumps last night and again this morning and noted a blister to the left lower extremity. On exam he was noted to have drainage to the right lower extremity. He will be placed in Unna boots bilaterally and follow-up next week 01/26/18; patient was actually discharged a week ago to his own juxta light stockings only to return the next day with bilateral lower extremity weeping edema.he was placed in bilateral Unna boots. He arrives today with pain in the back of his left leg. There is no open area on the right leg however there is a linear/wrap injury on the left leg and weeping edema on the left leg posteriorly. I spoke with infectious disease about 10 days ago. They were disappointed that the patient elected to discontinue prophylactic intramuscular penicillin shots as they felt it was particularly beneficial in reducing the frequency of his cellulitis. I discussed this with the patient today. He does not share this view. He'll definitely need antibiotics today. Finally he is traveling to [place] and trauma leaving this Saturday and returning a week later and he does not travel with his pumps. He is going by car 01/30/18; patient was seen 4 days ago and brought back in today for review of cellulitis in the left leg posteriorly. I put him on amoxicillin this really hasn't helped as much as I might like. He is also worried because he is traveling to [facility] trauma by car. Finally we will be rewrapping him. There is no open area on the right leg over his left leg has multiple weeping areas as usual 02/09/18; The same wrap on for 10 days. He did not pick up the last doxycycline I prescribed for  him. He apparently took 4 days worth he already had. There is nothing open on his right leg and the edema control is really quite good. He's had damage in the left leg medially and laterally especially probably related to the prolonged use of Unna boots 02/12/18; the patient arrived in clinic today for a nurse visit/wrap change. He complained of a lot of pain in the left posterior calf. He is taking doxycycline that I previously prescribed  for him. Unfortunately even though he used his stockings and apparently used to compression pumps twice a day he has weeping edema coming out of the lateral part of his right leg. This is coming from the lower anterior lateral skin area. 02/16/18; the patient has finished his doxycycline and will finish the amoxicillin 2 days. The area of cellulitis in the left calf posteriorly has resolved. He is no longer having any pain. He tells me he is using his compression pumps at least once a day sometimes twice. 02/23/18; the patient finished his doxycycline and Amoxil last week. On Friday he noticed a small erythematous circle about the size of a quarter on the left lower leg just above his ankle. This rapidly expanded and he now has erythema on the lateral and posterior part of the thigh. This is bright red. Also has an area on the dorsal foot just above his toes and a tender area just below the left popliteal fossa. He came off his prophylactic penicillin injections at his own insistence one or 2 months ago. This is obviously deteriorated since then 03/02/18; patient is on doxycycline and Amoxil. Culture I did last week of the weeping area on the back of his left calf grew group B strep. I have therefore renewed the amoxicillin 500 3 times a day for a further week. He has not been systemically unwell. Still complaining of an area of discomfort right under his left popliteal fossa. There is no open wound on the right leg. He tells me that he is using his pumps twice a day on  most days 03/09/18; patient arrives in clinic today completing his amoxicillin today. The cellulitis on his left leg is better. Furthermore he tells me that he had intramuscular penicillin shots that his primary care office today. However he also states that the wrap on his right leg fell down shortly after leaving clinic last week. He developed a large blister that was present when he came in for a nurse visit later in the week and then he developed intense discomfort around this area.He tells me he is using his compression pumps 03/16/18; the patient has completed his doxycycline. The infectious part of this/cellulitis in the left heel area left popliteal area is a lot better. He has 2 open areas on the right calf. Still areas on the left calf but this is a lot better as well. 03/24/18; the patient arrives complaining of pain in the left popliteal area again. He thinks some of this is wrap injury. He has no open area on the right leg and really no open area on the left calf either except for the popliteal area. He claims to be compliant with the compression pumps 03/31/18; I gave him doxycycline last week because of cellulitis in the left popliteal area. This is a lot better although the surface epithelium is denuded off and response to this. He arrives today with uncontrolled edema in the right calf area as well as a fingernail injury in the right lateral calf. There is only a few open areas on the left 04/06/18; I gave him amoxicillin doxycycline over the last 2 weeks that the amoxicillin should be completing currently. He is not complaining of any pain or systemic symptoms. The only open areas see has is on the right lateral lower leg paradoxically I cannot see anything on the left lower leg. He tells me he is using his compression pumps twice a day on most days. Silver alginate to the wounds that  are open under 4 layer compression 04/13/18; he completed antibiotics and has no new complaints. Using his  compression pumps. Silver alginate that anything that's opened 04/20/18; he is using his compression pumps religiously. Silver alginate 4 layer compression anything that's opened. He comes in today with no open wounds on the left leg but 3 on the right including a new one posteriorly. He has 2 on the right lateral and one on the right posterior. He likes Unna boots on the right leg for reasons that aren't really clear we had the usual 4 layer compression on the left. It may be necessary to move to the 4 layer compression on the right however for now I left them in the Unna boots 04/27/18; he is using his compression pumps at least once a day. He has still the wounds on the right lateral calf. The area right posteriorly has closed. He does not have an open wound on the left under 4 layer compression however on the dorsal left foot just proximal to the toes and the left third toe 2 small open areas were identified 05/11/18; he has not uses compression pumps. The areas on the right lateral calf have coalesced into one large wound necrotic surface. On the left side he has one small wound anteriorly however the edema is now weeping out of a large part of his left leg. He says he wasn't using his pumps because of the weeping fluid. I explained to him that this is the time he needs to pump more 05/18/18; patient states he is using his compression pumps twice a day. The area on the right lateral large wound albeit superficial. On the left side he has innumerable number of small new wounds on the left calf particularly laterally but several anteriorly and medially. All these appear to have healthy granulated base these look like the remnants of blisters however they occurred under compression. The patient arrives in clinic today with his legs somewhat better. There is certainly less edema, less multiple open areas on the left calf and the right anterior leg looks somewhat better as well superficial and a little  smaller. However he relates pain and erythema over the last 3-4 days in the thigh and I looked at this today. He has not been systemically unwell no fever no chills no change in blood sugar values 05/25/18; comes in today in a better state. The severe cellulitis on his left leg seems better with the Keflex. Not as tender. He has not been systemically unwell ooHard to find an open wound on the left lower leg using his compression pumps twice a day ooThe confluent wounds on his right lateral calf somewhat better looking. These will ultimately need debridement I didn't do this today. 06/01/18; the severe cellulitis on the left anterior thigh has resolved and he is completed his Keflex. ooThere is no open wound on the left leg however there is a superficial excoriation at the base of the third toe dorsally. Skin on the bottom of his left foot is macerated looking. ooThe left the wounds on the lateral right leg actually looks some better although he did require debridement of the top half of this wound area with an open curet 06/09/18 on evaluation today patient appears to be doing poorly in regard to his right lower extremity in particular this appears to likely be infected he has very thick purulent discharge along with a bright green tent to the discharge. This makes me concerned about the possibility of  pseudomonas. He's also having increased discomfort at this point on evaluation. Fortunately there does not appear to be any evidence of infection spreading to the other location at this time. 06/16/18 on evaluation today patient appears to actually be doing fairly well. His ulcer has actually diminished in size quite significantly at this point which is good news. Nonetheless he still does have some evidence of infection he did see infectious disease this morning before coming here for his appointment. I did review the results of their evaluation and their note today. They did actually have him  discontinue the Cipro and initiate treatment with linezolid at this time. He is doing this for the next seven days and they recommended a follow-up in four months with them. He is the keep a log of the need for intermittent antibiotic therapy between now and when he falls back up with infectious disease. This will help them gaze what exactly they need to do to try and help them out. 06/23/18; the patient arrives today with no open wounds on the left leg and left third toe healed. He is been using his compression pumps twice a day. On the right lateral leg he still has a sizable wound but this is a lot better than last time I saw this. 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 Has been using silver alginate under 4 layer compression 06/30/18; the only open wound he has is on the right lateral leg and this looks healthy. No debridement is required. We have been using silver alginate. He does not have an open wound on the left leg. There is apparently some drainage from the dorsal proximal third toe on the left although I see no open wound here. 07/03/18 on evaluation today patient was actually here just for a nurse visit rapid change. However when he was here on Wednesday for his rat change due to having been healed on the left and then developing blisters we initiated the wrap again knowing that he would be back today for [place] to reevaluate and see were at. Unfortunately he has developed some cellulitis into the proximal portion of his right lower extremity even into the region of his thigh. 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 He was placed on one as what at that point. Nonetheless he is done with that and has been tolerating it well otherwise. Doxycycline which in the past really did not seem to be effective for him. Nonetheless I think the best option may  be for [place] to definitely reinitiate the antibiotics for a longer period of time. 07/07/18; since I last saw this patient a week ago he has had a difficult time. At that point he did not have an open wound on his left leg. We transitioned him into juxta light stockings. He was apparently in the clinic the next day with blisters on the left lateral and left medial lower calf. He also had weeping edema fluid. He was put back into a compression wrap. He was also in the clinic on Friday with intense erythema in his right thigh. Per the patient he was started on Bactrim however that didn't work at all in terms of relieving his pain and swelling. He has taken 3 doxycycline that he had left over from last time and that seems to of helped. He has blistering on the  right thigh as well. 07/14/18; the erythema on his right thigh has gotten better with doxycycline that he is finishing. 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 Presumably this cellulitis in the thigh was not related to that although I think this is a bit concerning going forward. He still has an area on the right lateral calf the blister on the right medial calf just below the knee that was discussed above. On the left 2 small open areas left medial and left lateral. Edema control is adequate. He is using his compression pumps twice a day 07/20/18; continued improvement in the condition of both legs especially the edema in his bilateral thighs. He tells me he is been losing weight through a combination of diet and exercise. He is using his compression pumps twice a day. So overall she made to the remaining wounds 07/27/2018; continued improvement in condition of both legs. His edema is well controlled. The area on the right lateral leg is just about closed he had one blisters show up on the medial left upper calf. We have him in 4 layer compression. He is going on a 10-day trip to  [place], [place] and [place]. He will be driving. He wants to wear Unna boots because of the lessening amount of constriction. He will not use compression pumps while he is away 08/05/18 on evaluation today patient actually appears to be doing decently well all things considered in regard to his bilateral lower extremities. The worst ulcer is actually only posterior aspect of his left lower extremity with a four layer compression wrap cut into his leg a couple weeks back. He did have a trip and actually had [employer] for the trip that he is worn since he was last here. Nonetheless he feels like the [employer] actually do better for him his swelling is up a little bit but he also with his trip was not taking his Lasix on a regular set schedule like he was supposed to be. He states that obviously the reason being that he cannot drive and keep going without having to urinate too frequently which makes it difficult. He did not have his pumps with him while he was away either which I think also maybe playing a role here too. 08/13/2018; the patient only has a small open wound on the right lateral calf which is a big improvement in the last month or 2. He also has the area posteriorly just below the posterior fossa on the left which I think was a wrap injury from several weeks ago. He has no current evidence of cellulitis. He tells me he is back into his compression pumps twice a day. He also tells me that while he was at the laundromat somebody stole a section of his extremitease stockings 08/20/2018; back in the clinic with a much improved state. He only has small areas on the right lateral mid calf which is just about healed. This was is more substantial area for quite a prolonged period of time. He has a small open area on the left anterior tibia. The area on the posterior calf just below the popliteal fossa is closed today. He is using his compression pumps twice a day 08/28/2018;  patient has no open wound on the right leg. He has a smattering of open areas on the calf with some weeping lymphedema. More problematically than that it looks  as though his wraps of slipped down in his usual he has very angry upper area of edema just below the right medial knee and on the right lateral calf. He has no open area on his feet. The patient is traveling to [facility] next week. I will send him in an antibiotic. We will continue to wrap the right leg. We ordered extremitease stockings for him last week and I plan to transition the right leg to a stocking when he gets home which will be in 10 days time. As usual he is very reluctant to take his pumps with him when he travels 09/07/2018; patient returns from [facility]. He shows me a picture of his left leg in the mid part of his trip last week with intense fire engine erythema. The picture look bad enough I would have considered sending him to the hospital. Instead he went to the wound care center in [facility]. They did not prescribe him antibiotics but he did take some doxycycline he had leftover from a previous visit. I had given him trimethoprim sulfamethoxazole before he left this did not work according to the patient. This is resulted in some improvement fortunately. He comes back with a large wound on the left posterior calf. Smaller area on the left anterior tibia. Denuded blisters on the dorsal left foot over his toes. Does not have much in the way of wounds on the right leg although he does have a very tender area on the right posterior area just below the popliteal fossa also suggestive of infection. He promises me he is back on his pumps twice a day 09/15/2018; the intense cellulitis in his left lower calf is a lot better. The wound area on the posterior left calf is also so better. However he has reasonably extensive wounds on the dorsal aspect of his second and third toes and the proximal foot just at the base of  the toes. There is nothing open on the right leg 09/22/2018; the patient has excellent edema control in his legs bilaterally. He is using his external compression pumps twice a day. He has no open area on the right leg and only the areas in the left foot dorsally second and third toe area on the left side. He does not have any signs of active cellulitis. 10/06/2018; the patient has good edema control bilaterally. He has no open wound on the right leg. There is a blister in the posterior aspect of his left calf that we had to deal with today. He is using his compression pumps twice a day. There is no signs of active cellulitis. We have been using silver alginate to the wound areas. He still has vulnerable areas on the base of his left first second toes dorsally He has a his extremities stockings and we are going to transition him today into the stocking on the right leg. He is cautioned that he will need to continue to use the compression pumps twice a day. If he notices uncontrolled edema in the right leg he may need to go to 3 times a day. 10/13/2018; the patient came in for a nurse check on Friday he has a large flaccid blister on the right medial calf just below the knee. We unroofed this. He has this and a new area underneath the posterior mid calf which was undoubtedly a blister as well. He also has several small areas on the right which is the area we put his extremities stocking on.  10/19/2018; the patient went to see infectious disease this morning I am not sure if that was a routine follow-up in any case the doxycycline I had given him was discontinued and started on linezolid. He has not started this. It is easy to look at his left calf and the inflammation and think this is cellulitis however he is very tender in the tissue just below the popliteal fossa and I have no doubt that there is infection going on here. He states the problem he is having is that with the compression pumps the edema  goes down and then starts walking the wrap falls down. We will see if we can adhere this. He has 1 or 2 minuscule open areas on the right still areas that are weeping on the posterior left calf, the base of his left second and third toes 10/26/18; back today in clinic with quite of skin breakdown in his left anterior leg. This may have been infection the area below the popliteal fossa seems a lot better however tremendous epithelial loss on the left anterior mid tibia area over quite inexpensive tissue. He has 2 blisters on the right side but no other open wound here. 10/29/2018; came in urgently to see [place] today and we worked him in for review. He states that the 4 layer compression on the right leg caused pain he had to cut it down to roughly his mid calf this caused swelling above the wrap and he has blisters and skin breakdown today. As a result of the pain he has not been using his pumps. Both legs are a lot more edematous and there is a lot of weeping fluid. 11/02/18; arrives in clinic with continued difficulties in the right leg> left. Leg is swollen and painful. multiple skin blisters and new open areas especially laterally. He has not been using his pumps on the right leg. 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 He is not systemically unwell. 11/09/2018; the patient claims he is being compliant with his pumps. He is finished the doxycycline I gave him last week. Culture I did of the wound on the right lateral leg showed a few very resistant methicillin staph aureus. This was resistant to doxycycline. Nevertheless he states the pain in the leg is a lot better which makes me wonder if the cultured organism was not really what was causing the problem nevertheless this is a very dangerous organism to be culturing out of any wound. His right leg is still a lot larger than the left. He is using an Radio broadcast assistant on this area, he blames a 4-layer compression  for causing the original skin breakdown which I doubt is true however I cannot talk him out of it. We have been using silver alginate to all of these areas which were initially blisters 11/16/2018; patient is being compliant with his external compression pumps at twice a day. Miraculously he arrives in clinic today with absolutely no open wounds. He has better edema control on the left where he has been using 4 layer compression versus wound of wounds on the right and I pointed this out to him. There is no inflammation in the skin in his lower legs which is also somewhat unusual for him. There is no open wounds on the dorsal left foot. He has extremitease stockings at home and I have asked him to bring these in next week. 11/25/18 patient's lower extremity on examination today on the left appears  for the most part to be wound free. He does have an open wound on the lateral aspect of the right lower extremity but this is minimal compared to what I've seen in past. He does request that we go ahead and wrap the left leg as well even though there's nothing open just so hopefully it will not reopen in short order. 1/28; patient has superficial open wounds on the right lateral calf left anterior calf and left posterior calf. His edema control is adequate. 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 We have been using silver alginate as the primary dressing. He claims compliance with his compression pumps 2/4; patient has superficial open wounds on numerous areas of his left calf and again one on the left dorsal foot. The areas on the right lateral calf have healed. The cellulitis that I gave him doxycycline for last week is also resolved this was mostly on the left anterior calf just below the tibial tuberosity. His edema looks fairly well-controlled. He tells me he went to see his primary doctor today and had blood work  ordered 2/11; once again he has several open areas on the left calf left tibial area. Most of these are small and appear to have healthy granulation. He does not have anything open on the right. The edema and control in his thighs is pretty good which is usually a good indication he has been using his pumps as requested. 2/18; he continues to have several small areas on the left calf and left tibial area. Most of these are small healthy granulation. We put him in his stocking on the right leg last week and he arrives with a superficial open area over the right upper tibia and a fairly large area on the right lateral tibia in similar condition. His edema control actually does not look too bad, he claims to be using his compression pumps twice a day 2/25. Continued small areas on the left calf and left tibial area. New areas especially on the right are identified just below the tibial tuberosity and on the right upper tibia itself. There are also areas of weeping edema fluid even without an obvious wound. He does not have a considerable degree of lymphedema but clearly there is more edema here than his skin can handle. He states he is using the pumps twice a day. We have an Unna boot on the right and 4 layer compression on the left. 3/3; he continues to have an area on the right lateral calf and right posterior calf just below the popliteal fossa. There is a fair amount of tenderness around the wound on the popliteal fossa but I did not see any evidence of cellulitis, could just be that the wrap came down and rubbed in this area. ooHe does not have an open area on the left leg however there is an area on the left dorsal foot at the base of the third toe ooWe have been using silver alginate to all wound areas 3/10; he did not have an open area on his left leg last time he was here a week ago. T oday he arrives with a horizontal wound just below the tibial tuberosity and an area on the left lateral  calf. He has intense erythema and tenderness in this area. The area is on the right lateral calf and right posterior calf better than last week. We have been using  silver alginate as usual 3/18 - Patient returns with 3 small open areas on left calf, and 1 small open area on right calf, the skin looks ok with no significant erythema, he continues the UNA boot on right and 4 layer compression on left. The right lateral calf wound is closed , the right posterior is small area. we will continue silver alginate to the areas. 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 He is been tolerating the dressing changes without complication. Fortunately she has made excellent improvement in regard to the overall status of his wounds. Unfortunately every time we cease wrapping him he ends up reopening in causing more significant issues at that point. Again I'm unsure of the best direction to take although I think the lymphedema clinic may be appropriate for him. 02/03/19 on evaluation today patient appears to be doing well in regard to the wounds that we saw him for last week unfortunately he has a new area on the proximal portion of his right medial/posterior lower extremity where the wrap somewhat slowed down and caused swelling and a blister to rub and open. Unfortunately this is the only opening that he has on either leg at this point. 02/17/19 on evaluation today patient's bilateral lower extremities appear to be doing well. He still completely healed in regard to the left lower extremity. In regard to the right lower extremity the area where the wrap and slid down and caused the blister still seems to be slightly open although this is dramatically better than during the last evaluation two weeks ago. I'm very pleased with  the way this stands overall. 03/03/19 on evaluation today patient appears to be doing well in regard to his right lower extremity in general although he did have a new blister open this does not appear to be showing any evidence of active infection at this time. Fortunately there's No fevers, chills, nausea, or vomiting noted at this time. Overall I feel like he is making good progress it does feel like that the right leg will we perform the [employer] seems to do with a bit better than three layer wrap on the left which slid down on him. We may switch to doing bilateral in the book wraps. 5/4; I have not seen Mr. Street in quite some time. According to our case manager he did not have an open wound on his left leg last week. He had 1 remaining wound on the right posterior medial calf. He arrives today with multiple openings on the left leg probably were blisters and/or wrap injuries from Unna boots. I do not think the Unna boot's will provide adequate compression on the left. I am also not clear about the frequency he is using the compression pumps. 03/17/19 on evaluation today patient appears to be doing excellent in regard to his lower extremities compared to last week's evaluation apparently. He had gotten significantly worse last week which is unfortunate. The [employer] wrap on the left did not seem to do very well for him at all and in fact it didn't control his swelling significantly enough he had an additional outbreak. Subsequently we go back to the four layer compression wrap on the left. This is good news. At least in that he is doing better and the wound seem to be killing  him. He still has not heard anything from the lymphedema clinic. 03/24/19 on evaluation today patient actually appears to be doing much better in regard to his bilateral lower Trinity as compared to last week when I saw him. Fortunately there's no signs of active infection at this time. He has been tolerating the dressing  changes without complication. Overall I'm extremely pleased with the progress and appearance in general. 04/07/19 on evaluation today patient appears to be doing well in regard to his bilateral lower extremities. His swelling is significantly down from where it was previous. With that being said he does have a couple blisters still open at this point but fortunately nothing that seems to be too severe and again the majority of the larger openings has healed at this time. 04/14/19 on evaluation today patient actually appears to be doing quite well in regard to his bilateral lower extremities in fact I'm not even sure there's anything significantly open at this time at any site. Nonetheless he did have some trouble with these wraps where they are somewhat irritating him secondary to the fact that he has noted that the graph wasn't too close down to the end of this foot in a little bit short as well up to his knee. Otherwise things seem to be doing quite well. 04/21/19 upon evaluation today patient's wound bed actually showed evidence of being completely healed in regard to both lower extremities which is excellent news. There does not appear to be any signs of active infection which is also good news. I'm very pleased in this regard. No fevers, chills, nausea, or vomiting noted at this time. 04/28/19 on evaluation today patient appears to be doing a little bit worse in regard to both lower extremities on the left mainly due to the fact that when he went infection disease the wrap was not wrapped quite high enough he developed a blister above this. On the right he is a small open area of nothing too significant but again this is continuing to give him some trouble he has been were in the Velcro compression that he has at home. 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 He's been tolerating the dressing changes without complication. Fortunately  there's no signs of active infection at this time. No fevers, chills, nausea, or vomiting noted at this time. We have been trying to get an appointment with her lymphedema clinic in [facility] but unfortunately nobody can get them on phone with not been able to even fax information over the patient likewise is not been able to get in touch with them. Overall I'm not sure exactly what's going on here with to reach out again today. 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 Still having a lot of drainage unfortunately. He tells me especially in the left but even on the right. There's no signs of active infection which is good news we've been using so ratcheted up to this point. 05/19/19 on evaluation today patient actually appears to be doing quite well with regard to his left lower extremity which is great news. Fortunately in regard to the right lower extremity has an issues with his wrap and he subsequently did remove this from what I'm understanding. Nonetheless long story short is what he had rewrapped once he removed it subsequently had maggots underneath this wrap whenever he came in for  evaluation today. With that being said they were obviously completely cleaned away by the nursing staff. The visit today which is excellent news. However he does appear to potentially have some infection around the right ankle region where the maggots were located as well. He will likely require anabiotic therapy today. 05/26/19 on evaluation today patient actually appears to be doing much better in regard to his bilateral lower extremities. I feel like the infection is under much better control. With that being said there were maggots noted when the wrap was removed yet again today. Again this could have potentially been left over from previous although at this time there does not appear to be any signs of  significant drainage there was obviously on the wrap some drainage as well this contracted gnats or otherwise. Either way I do not see anything that appears to be doing worse in my pinion and in fact I think his drainage has slowed down quite significantly likely mainly due to the fact to his infection being under better control. 06/02/2019 on evaluation today patient actually appears to be doing well with regard to his bilateral lower extremities there is no signs of active infection at this time which is great news. With that being said he does have several open areas more so on the right than the left but nonetheless these are all significantly better than previously noted. 06/09/2019 on evaluation today patient actually appears to be doing well. His wrap stayed up and he did not cause any problems he had more drainage on the right compared to the left but overall I do not see any major issues at this time which is great news. 06/16/2019 on evaluation today patient appears to be doing excellent with regard to his lower extremities the only area that is open is a new blister that can have opened as of today on the medial ankle on the left. Other than this he really seems to be doing great I see no major issues at this point. 06/23/2019 on evaluation today patient appears to be doing quite well with regard to his bilateral lower extremities. In fact he actually appears to be almost completely healed there is a small area of weeping noted of the right lower extremity just above the ankle. Nonetheless fortunately there is no signs of active infection at this time which is good news. No fevers, chills, nausea, vomiting, or diarrhea. 8/24; the patient arrived for a nurse visit today but complained of very significant pain in the left leg and therefore I was asked to look at this. Noted that he did not have an open area on the left leg last week nevertheless this was wrapped. The patient states that he is not  been able to put his compression pumps on the left leg because of the discomfort. He has not been systemically unwell 06/30/2019 on evaluation today patient unfortunately despite being excellent last week is doing much worse with regard to his left lower extremity today. In fact he had to come in for a nurse on Monday where his left leg had to be rewrapped due to excessive weeping Dr. Leanord Hawking placed him on doxycycline at that point. Fortunately there is no signs of active infection Systemically at this time which is good news. 07/07/2019 in regard to the patient's wounds today he actually seems to be doing well with his right lower extremity there really is nothing open or draining at this point this is great news. Unfortunately the left lower extremity  is given him additional trouble at this time. There does not appear to be any signs of active infection nonetheless he does have a lot of edema and swelling noted at this point as well as blistering all of which has led to a much more poor appearing leg at this time compared to where it was 2 weeks ago when it was almost completely healed. Obviously this is a little discouraging for the patient. He is try to contact the lymphedema clinic in [place] he has not been able to get through to them. 07/14/2019 on evaluation today patient actually appears to be doing slightly better with regard to his left lower extremity ulcers. Overall I do feel like at least at the top of the wrap that we have been placing this area has healed quite nicely and looks much better. The remainder of the leg is showing signs of improvement. Unfortunately in the thigh area he still has an open region on the left and again on the right he has been utilizing just a Band-Aid on an area that also opened on the thigh. Again this is an area that were not able to wrap although we did do an Ace wrap to provide some compression that something that obviously is a little less effective than  the compression wraps we have been using on the lower portion of the leg. He does have an appointment with the lymphedema clinic in [facility] on Friday. 07/21/2019 on evaluation today patient appears to be doing better with regard to his lower extremity ulcers. He has been tolerating the dressing changes without complication. Fortunately there is no signs of active infection at this time. No fevers, chills, nausea, vomiting, or diarrhea. I did receive the paperwork from the physical therapist at the lymphedema clinic in [place]. Subsequently I signed off on that this morning and sent that back to him for further progression with the treatment plan. 07/28/2019 on evaluation today patient appears to be doing very well with regard to his right lower extremity where I do not see any open wounds at this point. Fortunately he is feeling great as far as that is concerned as well. In regard to the left lower extremity he has been having issues with still several areas of weeping and edema although the upper leg is doing better his lower leg still I think is going require the compression wrap at this time. No fevers, chills, nausea, vomiting, or diarrhea. 08/04/2019 on evaluation today patient unfortunately is having new wounds on the right lower extremity. Again we have been using Unna boot wrap on that side. We switched him to using his juxta lite wrap at home. With that being said he tells me he has been using it although his legs extremely swollen and to be honest really does not appear that he has been. I cannot know that for sure however. Nonetheless he has multiple new wounds on the right lower extremity at this time. Obviously we will have to see about getting this rewrapped for him today. 08/11/2019 on evaluation today patient appears to be doing fairly well with regard to his wounds. He has been tolerating the dressing changes including the compression wraps without complication. He still has a  lot of edema in his upper thigh regions bilaterally he is supposed to be seeing the lymphedema clinic on the 15th of this month once his wraps arrive for the upper part of his legs. 08/18/2019 on evaluation today patient appears to be doing well with regard  to his bilateral lower extremities at this point. He has been tolerating the dressing changes without complication. Fortunately there is no signs of active infection which is also good news. He does have a couple weeping areas on the first and second toe of the right foot he also has just a small area on the left foot upper leg and a small area on the left lower leg but overall he is doing quite well in my opinion. He is supposed to be getting his wraps shortly in fact tomorrow and then subsequently is seeing the lymphedema clinic next Wednesday on the 21st. Of note he is also leaving on the 25th to go on vacation for a week to the beach. For that reason and since there is some uncertainty about what there can be doing at lymphedema clinic next Wednesday I am get a make an appointment for next Friday here for [place] to see what we need to do for him prior to him leaving for vacation. 10/23; patient arrives in considerable pain predominantly in the upper posterior calf just distal to the popliteal fossa also in the wound anteriorly above the major wound. This is probably cellulitis and he has had this recurrently in the past. He has no open wound on the right side and he has had an Radio broadcast assistant in that area. Finally I note that he has an area on the left posterior calf which by enlarge is mostly epithelialized. This protrudes beyond the borders of the surrounding skin in the setting of dry scaly skin and lymphedema. The patient is leaving for [facility] on Sunday. Per his longstanding pattern, he will not take his compression pumps with him predominantly out of fear that they will be stolen. He therefore asked that we put a Unna boot back on the right  leg. He will also contact the wound care center in [facility] to see if they can change his dressing in the mid week. 11/3; patient returned from his vacation to [facility]. He was seen on 1 occasion at their wound care center. They did a 2 layer compression system as they did not have our 4-layer wrap. I am not completely certain what they put on the wounds. They did not change the Unna boot on the right. The patient is also seeing a lymphedema specialist physical therapist in [place]. It appears that he has some compression sleeve for his thighs which indeed look quite a bit better than I am used to seeing. He pumps over these with his external compression pumps. 11/10; the patient has a new wound on the right medial thigh otherwise there is no open areas on the right. He has an area on the left leg posteriorly anteriorly and medially and an area over the left second toe. We have been using silver alginate. He thinks the injury on his thigh is secondary to friction from the compression sleeve he has. 11/17; the patient has a new wound on the right medial thigh last week. He thinks this is because he did not have a underlying stocking for his thigh juxta lite apparatus. He now has this. The area is fairly large and somewhat angry but I do not think he has underlying cellulitis. ooHe has a intact blister on the right anterior tibial area. ooSmall wound on the right great toe dorsally ooSmall area on the medial left calf. 11/30; the patient does not have any open areas on his right leg and we did not take his juxta  lite stocking off. However he states that on Friday his compression wrap fell down lodging around his upper mid calf area. As usual this creates a lot of problems for him. He called urgently today to be seen for a nurse visit however the nurse visit turned into a provider visit because of extreme erythema and pain in the left anterior tibia extending laterally and posteriorly.  The area that is problematic is extensive 10/06/2019 upon evaluation today patient actually appears to be doing poorly in regard to his left lower extremity. He Dr. Leanord Hawking did place him on doxycycline this past Monday apparently due to the fact that he was doing much worse in regard to this left leg. Fortunately the doxycycline does seem to be helping. Unfortunately we are still having a very difficult time getting his edema under any type of control in order to anticipate discharge at some point. The only way were really able to control his lymphedema really is with compression wraps and that has only even seemingly temporary. He has been seeing a lymphedema clinic they are trying to help in this regard but still this has been somewhat frustrating in general for the patient. 10/13/19 on evaluation today patient appears to be doing excellent with regard to his right lower extremity as far as the wounds are concerned. His swelling is still quite extensive unfortunately. He is still having a lot of drainage from the thigh areas bilaterally which is unfortunate. He's been going to lymphedema clinic but again he still really does not have this edema under control as far as his lower extremities are concern. With regard to his left lower extremity this seems to be improving and I do believe the doxycycline has been of benefit for him. He is about to complete the doxycycline. 10/20/2019 on evaluation today patient appears to be doing poorly in regard to his bilateral lower extremities. More in the right thigh he has a lot of irritation at this site unfortunately. In regard to the left lower extremity the wrap was not quite as high it appears and does seem to have caused him some trouble as well. Fortunately there is no evidence of systemic infection though he does have some blue-green drainage which has me concerned for the possibility of Pseudomonas. He tells me he is previously taking Cipro without  complications and he really does not care for Levaquin however due to some of the side effects he has. He is not allergic to any medications specifically antibiotics that were aware of. 10/27/2019 on evaluation today patient actually does appear to be for the most part doing better when compared to last week's evaluation. With that being said he still has multiple open wounds over the bilateral lower extremities. He actually forgot to start taking the Cipro and states that he still has the whole bottle. He does have several new blisters on left lower extremity today I think I would recommend he go ahead and take the Cipro based on what I am seeing at this point. 12/30-Patient comes at 1 week visit, 4 layer compression wraps on the left and Unna boot on the right, primary dressing Xtrasorb and silver alginate. Patient is taking his Cipro and has a few more days left probably 5-6, and the legs are doing better. He states he is using his compressions devices which I believe he has 11/10/2019 on evaluation today patient actually appears to be much better than last time I saw him 2 weeks ago. His wounds are significantly improved  and overall I am very pleased in this regard. Fortunately there is no signs of active infection at this time. He is just a couple of days away from completing Cipro. Overall his edema is much better he has been using his lymphedema pumps which I think is also helping at this point. 11/17/2019 on evaluation today patient appears to be doing excellent in regard to his wounds in general. His legs are swollen but not nearly as much as they have been in the past. Fortunately he is tolerating the compression wraps without complication. No fevers, chills, nausea, vomiting, or diarrhea. He does have some erythema however in the distal portion of his right lower extremity specifically around the forefoot and toes there is a little bit of warmth here as well. 11/24/2019 on evaluation today  patient appears to be doing well with regard to his right lower extremity I really do not see any open wounds at this point. His left lower extremity does have several open areas and his right medial thigh also is open. Other than this however overall the patient seems to be making good progress and I am very pleased at this point. 12/01/2019 on evaluation today patient appears to be doing poorly at this point in regard to his left lower extremity has several new blisters despite the fact that we have him in compression wraps. In fact he had a 4-layer compression wrap, his upper thigh wrapped from lymphedema clinic, and a juxta light over top of the 4 layer compression wrap the lymphedema clinic applied and despite all this he still develop blisters underneath. Obviously this does have me concerned about the fact that unfortunately despite what we are doing to try to get wounds healed he continues to have new areas arise I do not think he is ever good to be at the point where he can realistically just use wraps at home to keep things under control. Typically when we heal him it takes about 1-2 days before he is back in the clinic with severe breakdown and blistering of his lower extremities bilaterally. This is happened numerous times in the past. Unfortunately I think that we may need some help as far as overall fluid overload to kind of limit what we are seeing and get things under better control. 12/08/2019 on evaluation today patient presents for follow-up concerning his ongoing bilateral lower extremity edema. Unfortunately he is still having quite a bit of swelling the compression wraps are controlling this to some degree but he did see Dr. [name] his cardiologist I do have that available for review today as far as the appointment was concerned that was on 12/06/2019. Obviously that she has been 2 days ago. The patient states that he is only been taking the Lasix 80 mg 1 time a day he had told me  previously he was taking this twice a day. Nonetheless Dr. [name] recommended this be up to 80 mg 2 times a day for the patient as he did appear to be fluid overloaded. With that being said the patient states he did this yesterday and he was unable to go anywhere or do anything due to the fact that he was constantly having to urinate. Nonetheless I think that this is still good to be something that is important for him as far as trying to get his edema under control at all things that he is going to be able to just expect his wounds to get under control and things to be better  without going through at least a period of time where he is trying to stabilize his fluid management in general and I think increasing the Lasix is likely the first step here. It was also mentioned the possibility that the patient may require metolazone. With that being said he wanted to have the patient take Lasix twice a day first and then reevaluating 2 months to see where things stand. 12/15/2019 upon evaluation today patient appears to be doing regard to his legs although his toes are showing some signs of weeping especially on the left at this point to some degree on the right. There does not appear to be any signs of active infection and overall I do feel like the compression wraps are doing well for him but he has not been able to take the Lasix at home and the increased dose that Dr. [name] recommended. He tells me that just not go to be feasible for him. Nonetheless I think in this case he should probably send a message to Dr. [name] in order to discuss options from the standpoint of possible admission to get the fluid off or otherwise going forward. 12/22/2019 upon evaluation today patient appears to be doing fairly well with regard to his lower extremities at this point. In fact he would be doing excellent if it was not for the fact that his right anterior thigh apparently had an allergic reaction to adhesive tape that he  used. The wound itself that we have been monitoring actually appears to be healed. There is a lot of irritation at this point. 12/29/2019 upon evaluation today patient appears to be doing well in regard to his lower extremities. His left medial thigh is open and somewhat draining today but this is the only region that is open the right has done much better with the treatment utilizing the steroid cream that I prescribed for him last week. Overall I am pleased in that regard. Fortunately there is no signs of active infection at this time. No fevers, chills, nausea, vomiting, or diarrhea. 01/05/2020 upon evaluation today patient appears to be doing more poorly in regard to his right lower extremity at this point upon evaluation today. Unfortunately he continues to have issues in this regard and I think the biggest issue is controlling his edema. This obviously is not very well controlled at this point is been recommended that he use the Lasix twice a day but he has not been able to do that. Unfortunately I think this is leading to an issue where honestly he is not really able to effectively control his edema and therefore the wounds really are not doing significantly better. I do not think that he is going to be able to keep things under good control unless he is able to control his edema much better. I discussed this again in great detail with him today. 01/12/2020 good news is patient actually appears to be doing quite well today at this point. He does have an appointment with lymphedema clinic tomorrow. His legs appear healed and the toe on the left is almost completely healed. In general I am very pleased with how things stand at this point. 01/19/2020 upon evaluation today patient appears to actually be doing well in regard to his lower extremities there is nothing open at this point. Fortunately he has done extremely well more recently. Has been seeing lymphedema clinic as well. With that being said he  has Velcro wraps for his lower legs as well as his upper  legs. The only wound really is on his toe which is the right great toe and this is barely anything even there. With all that being said I think it is good to be appropriate today to go ahead and switch him over to the Velcro compression wraps. 01/26/2020 upon evaluation today patient appears to be doing worse with regard to his lower extremities after last week switch him to Velcro compression wraps. Unfortunately he lasted less than 24 hours he did not have the sock portion of his Velcro wrap on the left leg and subsequently developed a blister underneath the Velcro portion. Obviously this is not good and not what we were looking for at this point. He states the lymphedema clinic did tell him to wear the wrap for 23 hours and take him off for 1 I am okay with that plan but again right now we got a get things back under control again he may have some cellulitis noted as well. 02/02/2020 upon evaluation today patient unfortunately appears to have several areas of blistering on his bilateral lower extremities today mainly on the feet. His legs do seem to be doing somewhat better which is good news. Fortunately there is no evidence of active infection at this time. No fevers, chills, nausea, vomiting, or diarrhea. 02/16/2020 upon evaluation today patient appears to be doing well at this time with regard to his legs. He has a couple weeping areas on his toes but for the most part everything is doing better and does appear to be sealed up on his legs which is excellent news. We can continue with wrapping him at this point as he had every time we discontinue the wraps he just breaks out with new wounds. There is really no point in is going forward with this at this point. 03/08/2020 upon evaluation today patient actually appears to be doing quite well with regard to his lower extremity ulcers. He has just a very superficial and really almost nonexistent  blister on the left lower extremity he has in general done very well with the compression wraps. With that being said I do not see any signs of infection at this time which is good news. 03/29/2020 upon evaluation today patient appears to be doing well with regard to his wounds currently except for where he had several new areas that opened up due to some of the wrap slipping and causing him trouble. He states he did not realize they had slipped. Nonetheless he has a 1 area on the right and 3 new areas on the left. Fortunately there is no signs of active infection at this time which is great news. 04/05/2020 upon evaluation today patient actually appears to be doing quite well in general in regard to his legs currently. Fortunately there is no signs of active infection at this time. No fevers, chills, nausea, vomiting, or diarrhea. He tells me next week that he will actually be seen in the lymphedema clinic on Thursday at 10 AM I see him on Wednesday next week. 04/12/2020 upon evaluation today patient appears to be doing very well with regard to his lower extremities bilaterally. In fact he does not appear to have any open wounds at this point which is good news. Fortunately there is no signs of active infection at this time. No fevers, chills, nausea, vomiting, or diarrhea. 04/19/2020 upon evaluation today patient appears to be doing well with regard to his wounds currently on the bilateral lower extremities. There does not appear to be  any signs of active infection at this time. Fortunately there is no evidence of systemic infection and overall very pleased at this point. Nonetheless after I held him out last week he literally had blisters the next morning already which swelled up with him being right back here in the clinic. Overall I think that he is just not can be able to be discharged with his legs the way they are he is much to volume overloaded as far as fluid is concerned and that was discussed  with him today of also discussed this but should try the clinic nurse manager as well as Dr. Leanord Hawking. 04/26/2020 upon evaluation today patient appears to be doing better with regard to his wounds currently. He is making some progress and overall swelling is under good control with the compression wraps. Fortunately there is no evidence of active infection at this time. 05/10/2020 on evaluation today patient appears to be doing overall well in regard to his lower extremities bilaterally. He is Tolerating the compression wraps without complication and with what we are seeing currently I feel like that he is making excellent progress. There is no signs of active infection at this time. 05/24/2020 upon evaluation today patient appears to be doing well in regard to his legs. The swelling is actually quite a bit down compared to where it has been in the past. Fortunately there is no sign of active infection at this time which is also good news. With that being said he does have several wounds on his toes that have opened up at this point. 05/31/2020 upon evaluation today patient appears to be doing well with regard to his legs bilaterally where he really has no significant fluid buildup at this point overall he seems to be doing quite well. Very pleased in this regard. With regard to his toes these also seem to be drying up which is excellent. We have continue to wrap him as every time we tried as a transition to the juxta light wraps things just do not seem to get any better. 06/07/2020 upon evaluation today patient appears to be doing well with regard to his right leg at this point. Unfortunately left leg has a lot of blistering he tells me the wrap started to slide down on him when he tried to put his other Velcro wrap over top of it to help keep things in order but nonetheless still had some issues. 06/14/2020 on evaluation today patient appears to be doing well with regard to his lower extremity ulcers and foot  ulcers at this point. I feel like everything is actually showing signs of improvement which is great news overall there is no signs of active infection at this time. No fevers, chills, nausea, vomiting, or diarrhea. 06/21/2020 on evaluation today patient actually appears to be doing okay in regard to his wounds in general. With that being said the biggest issue I see is on his right foot in particular the first and second toe seem to be doing a little worse due to the fact this is staying very wet. I think he is probably getting need to change out his dressings a couple times in between each week when we see him in regard to his toes in order to keep this drier based on the location and how this is proceeding. 06/28/2020 on evaluation today patient appears to be doing a little bit more poorly overall in regard to the appearance of the skin I am actually somewhat concerned about the  possibility of him having a little bit of an infection here. We discussed the course of potentially giving him a doxycycline prescription which he is taken previously with good result. With that being said I do believe that this is potentially mild and at this point easily fixed. I just do not want anything to get any worse. 07/12/2020 upon evaluation today patient actually appears to be making some progress with regard to his legs which is great news there does not appear to be any evidence of active infection. Overall very pleased with where things stand. 07/26/2020 upon evaluation today patient appears to be doing well with regard to his leg ulcers and toe ulcers at this point. He has been tolerating the compression wraps without complication overall very pleased in this regard. 08/09/2020 upon evaluation today patient appears to be doing well with regard to his lower extremities bilaterally. Fortunately there is no signs of active infection overall I am pleased with where things stand. 08/23/2020 on evaluation today patient  appears to be doing well with regard to his wound. He has been tolerating the dressing changes without complication. Fortunately there is no signs of active infection at this time. Overall his legs seem to be doing quite well which is great news and I am very pleased in that regard. No fevers, chills, nausea, vomiting, or diarrhea. 09/13/2020 upon evaluation today patient appears to be doing okay in regard to his lower extremities. He does have a fairly large blister on the right leg which I did remove the blister tissue from today so we can get this to dry out other than that however he seems to be doing quite well. There is no signs of active infection at this time. 09/27/2020 upon evaluation today patient appears to actually be doing some better in regard to his right leg. Fortunately signs of active infection at this time which is great news. No fevers, chills, nausea, vomiting, or diarrhea. 10/04/2020 upon evaluation today patient actually appears to be showing signs of improvement which is great news with regard to his leg ulcers. Fortunately there is no signs of active infection which is great news he is still taking the antibiotics currently. No fevers, chills, nausea, vomiting, or diarrhea. 10/18/2020 on evaluation today patient appears to be doing well with regard to his legs currently. He has been tolerating the dressing changes including the wraps without complication. Fortunately there is no signs of active infection at this time. No fevers, chills, nausea, vomiting, or diarrhea. 10/25/2020 upon evaluation today patient appears to be doing decently well in regard to his wounds currently. He has been tolerating the dressing changes without complication. Overall I feel like he is making good progress albeit slow. Again this is something we can have to continue to wrap for some time to come most likely. 11/08/2020 upon evaluation today patient appears to be doing well with regard to his wounds  currently. He has been tolerating the dressing changes without complication is not currently on any antibiotics and he does not appear to show any signs of infection. He does continue to have a lot of drainage on the right leg not too severe but nonetheless this is very scattered. On the left leg this is looking to be much improved overall. 11/15/2020 upon evaluation today patient appears to be doing better with regard to his legs bilaterally. Especially the right leg which was much more significant last week. There does not appear to be any signs of active infection which is  great news. No fevers, chills, nausea, vomiting, or diarrhea. 11/23/2019 upon evaluation today patient appears to be doing poorly still in regard to his lower extremities bilaterally. Unfortunately his right leg in particular appears to be doing much more poorly there is no signs really of infection this is not warm to touch but he does have a lot of drainage and weeping unfortunately. With that reason I do believe that we may need to initiate some treatment here to try to help calm down some of the swelling of the right leg. I think switching to a 4-layer compression wrap would be beneficial here. The patient is in agreement with giving this a try. 11/29/2020 upon evaluation today patient appears to be doing well currently in regard to his leg ulcers. I feel like the right leg is doing better he still has a lot of drainage but we do see some improvement here. The 4-layer compression wrap I think was helpful. 12/06/2020 upon evaluation today patient appears to be doing well with regard to his legs. In fact they seem to be doing about the best I have seen up to this point. Fortunately there is no signs of active infection at this time. No fevers, chills, nausea, vomiting, or diarrhea. 12/20/2020 upon evaluation today patient appears to be doing well at this time in regard to his legs. He is not having any significant draining which is  great news. Fortunately there is no signs of active infection at this time. No fevers, chills, nausea, vomiting, or diarrhea. 01/17/2021 upon evaluation today C[place]a actually appears to be doing excellent in regard to his legs. He has a few areas again that come and go as far as his toes are concerned but overall this is doing quite well. 01/31/2021 upon evaluation today patient appears to be doing well with regard to his legs. Fortunately there does not appear to be any signs of active infection which is great news. Overall he is still having significant edema despite the compression wraps basically the 4-layer compression wrap to just keep things under control there is really not much room for play. 4/13: Mr. Jaworski is a longstanding patient in our clinic and benefits greatly from weekly compression wraps. Today he has no complaints. He has been tolerating the wraps well. He states he is using the lymphedema pumps at home. 5/4; patient presents for follow-up of his chronic lymphedema/venous insufficiency ulcers. He comes weekly for compression wraps. He has no complaints today. He was unable to tolerate the Coflex 2 layer Last week so we will do the four press 4-layer compression. He has been using his lymphedema pumps daily. 5/18; patient presents for 2-week follow-up. He has no complaints or issues today. He has developed a new wound to the right foot on his fourth toe. He overall feels well and denies signs of infection. 6/1; patient presents for 2-week follow-up. He has no complaints or issues today. He denies signs of infection. 04/18/2021 upon evaluation today patient appears to be doing well with regard to his legs bilaterally. Family open wound is actually on the toe of his left foot everything else is completely closed which is great news. In general I am extremely pleased with where things stand at this point. The patient is also happy that things are doing so well. 05/02/2021 upon  evaluation today patient's legs actually appear to be doing quite well today. Fortunately there does not appear to be any signs of active infection which is great and overall I am  extremely pleased with where he stands today. The patient does not appear to have any evidence of active infection at this time which is also great news. 05/09/2021 upon evaluation today patient appears to be doing a little bit more poorly in regard to his legs. Unfortunately he is having issues with some breakdown and a blood blister on the left leg this is due to I believe honestly to how it was wrapped last week. Fortunately there does not appear to be any signs of infection but nonetheless this is still a concern to be honest. No fevers, chills, nausea, vomiting, or diarrhea. 05/16/2021 upon evaluation today patient appears to be doing significantly better as compared to last week. I am very pleased with where things stand today. There does not appear to be any signs of infection which is great news and overall very pleased with where we stand. No fevers, chills, nausea, vomiting, or diarrhea. 05/30/2021 upon evaluation today patient appears to be doing well with regard to his legs. He has been tolerating the dressing changes without complication. Fortunately there does not appear to be any signs of active infection which is great news and overall I am extremely pleased with where things stand today. No fevers, chills, nausea, vomiting, or diarrhea. 06/20/2021 upon evaluation today patient actually appears to be making good progress today and very pleased with what we are seeing. I think his legs are really maintaining. As long as we continue wrapping he seems to be doing excellent in my opinion. Fortunately there is no signs of active infection at this time. No fevers, chills, nausea, vomiting, or diarrhea. 07/11/2021 upon evaluation today patient actually appears to be making excellent progress at this time. Fortunately there  does not appear to be any evidence of active infection which is great news and overall I am extremely pleased with where things stand today. No fevers, chills, nausea, vomiting, or diarrhea. Objective Constitutional Obese and well-hydrated in no acute distress. Vitals Time Taken: 10:41 AM, Height: 70 in, Weight: 380.2 lbs, BMI: 54.5, Temperature: 98.2 F, Pulse: 80 bpm, Respiratory Rate: 18 breaths/min, Blood Pressure: 154/77 mmHg, Capillary Blood Glucose: 187 mg/dl. Respiratory normal breathing without difficulty. Psychiatric this patient is able to make decisions and demonstrates good insight into disease process. Alert and Oriented x 3. pleasant and cooperative. General Notes: Upon inspection patient's wound bed actually showed signs of good granulation and epithelization at this point. Fortunately there does not appear to be any signs of active infection systemically which is great news and overall I am extremely pleased with where we stand today. The patient is likewise happy that things are doing so well. Integumentary (Hair, Skin) Wound #193 status is Open. Original cause of wound was Gradually Appeared. The date acquired was: 01/31/2021. The wound has been in treatment 23 weeks. The wound is located on the Left T Second. The wound measures 0.1cm length x 0.1cm width x 0.1cm depth; 0.008cm^2 area and 0.001cm^3 volume. There oe is Fat Layer (Subcutaneous Tissue) exposed. There is no tunneling or undermining noted. There is a small amount of serous drainage noted. The wound margin is distinct with the outline attached to the wound base. There is large (67-100%) pink, pale granulation within the wound bed. There is no necrotic tissue within the wound bed. 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 Post procedure Diagnosis Wound #: Same as Pre-Procedure There was a Four Layer Compression Therapy Procedure by Shawn Stall, RN. Post procedure Diagnosis Wound #: Same as Pre-Procedure Plan Follow-up Appointments: Return appointment in 3 weeks. - with Leonard Schwartz 9/28 Nurse Visit: - one week 9/14 Bathing/ Shower/ Hygiene: May shower with protection but do not get wound dressing(s) wet. Edema Control - Lymphedema / SCD / Other: Lymphedema Pumps. Use Lymphedema pumps on leg(s) 2-3 times a day for 45-60 minutes. If wearing any wraps or hose, do not remove them. Continue exercising as instructed. Elevate legs to the level of the heart or above for 30 minutes daily and/or when sitting, a frequency of: - throughout the day Avoid standing for long periods of time. Exercise regularly Non Wound Condition: Other Non Wound Condition Orders/Instructions: - lotion to both legs, 4 layer compression wraps both legs, silver alginate to any weeping areas WOUND #193: - T Second Wound Laterality: Left oe Cleanser: Soap and Water 1 x Per Week/15 Days Discharge Instructions: May shower and wash wound with dial antibacterial soap and water prior to dressing change. Cleanser: Wound Cleanser 1 x Per Week/15 Days Discharge Instructions: Cleanse the wound with wound cleanser prior to applying a clean dressing using gauze sponges, not tissue or cotton balls. Prim Dressing: Maxorb Extra Calcium Alginate 2x2 in 1 x Per Week/15 Days ary Discharge Instructions: Apply calcium alginate to wound bed as instructed Secondary Dressing: Woven Gauze  Sponges 2x2 in 1 x Per Week/15 Days Discharge Instructions: Apply over primary dressing as directed. Secured With: Insurance underwriter, Sterile 2x75 (in/in) 1 x Per Week/15 Days Discharge Instructions: Secure with stretch gauze as directed. 1. Would recommend currently that we actually going to continue with the silver alginate to the open areas. I think that is doing great. 2. We will also continue with a 4-layer compression wrap which I likewise think is also doing extremely well for the patient currently. 3. I am also going to suggest that we continue to have the patient use his pumps and elevate his legs much as possible to help with edema control. We will see patient back for reevaluation in 1 week here in the clinic. If anything worsens or changes patient will contact our office for additional recommendations. Electronic Signature(s) Signed: 07/11/2021 1:14:18 PM By: Lenda Kelp PA-C Signed: 07/11/2021 1:14:18 PM By: Lenda Kelp PA-C Entered By: Lenda Kelp on 07/11/2021 13:14:18 -------------------------------------------------------------------------------- SuperBill Details Patient Name: Date of Service: [name], Baldo J. 07/11/2021 Medical Record Number: [id number] Patient Account Number: [id number] Date of Birth/Sex: Treating RN: Jun 13, 1951 (70 y.o. [name] Primary Care Provider: Nicoletta Ba Other Clinician: Referring Provider: Treating Provider/Extender: Adele Dan in Treatment: 285 Diagnosis Coding ICD-10 Codes Code Description [phone] Non-pressure chronic ulcer of other 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 of skin L97.829 Non-pressure chronic ulcer of other part of left lower leg with unspecified severity L97.519 Non-pressure chronic ulcer of other part of right foot with unspecified severity I87.333 Chronic venous hypertension (idiopathic) with ulcer and inflammation of bilateral lower extremity I89.0  Lymphedema, not elsewhere classified E11.622 Type 2 diabetes mellitus with other skin ulcer E11.40 Type 2  diabetes mellitus with diabetic neuropathy, unspecified Facility Procedures CPT4: Code [id number] Description: 81 BILATERAL: Application of multi-layer venous compression system; leg (below knee), including ankle and t. Modifier: Quantity: 1 Physician Procedures : CPT4 Code Description Modifier [id number] - WC PHYS LEVEL 4 - EST PT ICD-10 Diagnosis Description L97.521 Non-pressure chronic ulcer of other 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 of skin L97.829 Non-pressure chronic ulcer of other part of left  lower leg with unspecified severity L97.519 Non-pressure chronic ulcer of other part of right foot with unspecified severity I87.333 Chronic venous hypertension (idiopathic) with ulcer and inflammation of bilateral lower extremity Quantity: 1 Electronic Signature(s) Signed: 07/11/2021 1:14:42 PM By: Lenda Kelp PA-C Entered By: Lenda Kelp on 07/11/2021 13:14:36

## 2021-07-12 NOTE — Progress Notes (Signed)
INCREASE insulin dose by 2 units each morning and 2 units each evening.

## 2021-07-17 ENCOUNTER — Other Ambulatory Visit: Payer: Self-pay

## 2021-07-18 ENCOUNTER — Encounter: Payer: Self-pay | Admitting: Family Medicine

## 2021-07-18 ENCOUNTER — Ambulatory Visit (INDEPENDENT_AMBULATORY_CARE_PROVIDER_SITE_OTHER): Payer: Medicare Other | Admitting: Family Medicine

## 2021-07-18 ENCOUNTER — Other Ambulatory Visit: Payer: Self-pay | Admitting: Family Medicine

## 2021-07-18 ENCOUNTER — Encounter (HOSPITAL_BASED_OUTPATIENT_CLINIC_OR_DEPARTMENT_OTHER): Payer: Medicare Other | Admitting: Physician Assistant

## 2021-07-18 VITALS — BP 94/44 | HR 46 | Temp 97.6°F | Ht 70.0 in | Wt 381.0 lb

## 2021-07-18 DIAGNOSIS — E11621 Type 2 diabetes mellitus with foot ulcer: Secondary | ICD-10-CM | POA: Diagnosis not present

## 2021-07-18 DIAGNOSIS — R001 Bradycardia, unspecified: Secondary | ICD-10-CM

## 2021-07-18 DIAGNOSIS — I10 Essential (primary) hypertension: Secondary | ICD-10-CM

## 2021-07-18 DIAGNOSIS — L97829 Non-pressure chronic ulcer of other part of left lower leg with unspecified severity: Secondary | ICD-10-CM | POA: Diagnosis not present

## 2021-07-18 DIAGNOSIS — I87333 Chronic venous hypertension (idiopathic) with ulcer and inflammation of bilateral lower extremity: Secondary | ICD-10-CM | POA: Diagnosis not present

## 2021-07-18 DIAGNOSIS — I890 Lymphedema, not elsewhere classified: Secondary | ICD-10-CM | POA: Diagnosis not present

## 2021-07-18 DIAGNOSIS — L97519 Non-pressure chronic ulcer of other part of right foot with unspecified severity: Secondary | ICD-10-CM | POA: Diagnosis not present

## 2021-07-18 DIAGNOSIS — L97521 Non-pressure chronic ulcer of other 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 of skin: Secondary | ICD-10-CM | POA: Diagnosis not present

## 2021-07-18 DIAGNOSIS — E1140 Type 2 diabetes mellitus with diabetic neuropathy, unspecified: Secondary | ICD-10-CM | POA: Diagnosis not present

## 2021-07-18 DIAGNOSIS — E11622 Type 2 diabetes mellitus with other skin ulcer: Secondary | ICD-10-CM | POA: Diagnosis not present

## 2021-07-18 MED ORDER — DILTIAZEM HCL ER BEADS 120 MG PO CP24: 120.0000 mg | ORAL_CAPSULE | Freq: Every day | ORAL | 0 refills | Status: DC

## 2021-07-18 MED ORDER — BD PEN NEEDLE SHORT U/F 31G X 8 MM MISC: 1 refills | Status: AC

## 2021-07-18 NOTE — Progress Notes (Signed)
OFFICE VISIT  07/18/2021  CC:  Chief Complaint  Patient presents with   Follow-up    [name]; pt was taken off metoprolol tartrte 50mg  and placed on succinate 25mg .    HPI:    Patient is a 70 y.o. Caucasian male who presents for 1 wk f/u uncontrolled HTN + bradycardia. A/P as of last visit: "1) HTN, uncontrolled.  Worse control since change from lopressor 12.5 bid to toprol xl 12.5 qd, but HR consistently better->60s now as opposed to 40s.   Severely limited choices at this point regarding bp med additions-->aldactone was assoc with hyperkalemia, he did not tolerate hydralazine.  Avoiding thiazide d/t renal insufficiency plus pt already on lasix 80 bid.  He is maxed out on lisinopril.  Options add amlodipine but worry about this worsening his LE edema potentially. Clonidine is what I chose to go with today: start low and watch HR closely; 0.1mg  tid, ok to give extra 0.1mg  prn SBP>180 or DBP > 100.   2) DM; no signif home gluc info. He varies his insulin dosing a lot. Cont metformin 1000mg  bid. Hba1c and lytes/cr today.   3) CRI IIIb: avoids NSAIDs. Lytes/cr today.   4) Prostate ca screening: PSA due at this time->ordered."  INTERIM HX: Labs stable last visit. BP's still 180-200 over 80-110 for the most part, HR still 40s-50s for the most part. No dizziness, SOB, CP, or palpitations. Mild dry mouth after starting clonidine.    Past Medical History:  Diagnosis Date   ALLERGIC RHINITIS 08/11/2006   ASTHMA 08/11/2006   Chronic atrial fibrillation (HCC) 08/2014   Chronic combined systolic and diastolic CHF (congestive heart failure) [facility])    Cardiology f/u 12/2017: pt volume overloaded (R heart dysf suspected), BNP very high, lasix increased.  Repeat echo 12/2017: normal LV EF, mild DD, +RV syst dysfxn, mod pulm HTN, biatrial enlgmt.   Chronic constipation    Chronic renal insufficiency, stage 2 (mild)    Borderline stage III (GFR 60s).   Colon cancer screening 02/2021   02/2021  Cologuard POS->GI ref   DIABETES MELLITUS, TYPE II 08/11/2006   DM W/RENAL MANIFESTATIONS, TYPE II 04/20/2007   HYPERTENSION 08/11/2006   Impaired mobility and endurance    MRSA infection 06/2018   LL venous stasis ulcer infected   Normocytic anemia 2016-2019   03/2018 B12 normal, iron ok (ferritin borderline low).  Plan repeat CBC at f/u summer 2019 and if decreased from baseline will check hemoccults and iron labs again.   OBESITY, MORBID 12/14/2007   saxenda started 05/2020 by [facility] wt mgmt center   OSA (obstructive sleep apnea)    to get sleep study with [facility]) as of 12/03/2018 consult.   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  [id number] ID started him on amoxil prophylaxis---ineffective.  End 2018/Jan 2019 penicillin G injections prophyl helpful but pt declined to continue this as of 01/2018 ID f/u.  ID talked him into resuming monthly penicillin G as of 02/2018 f/u.     Restless leg syndrome    Rx'd clonazepam 09/2017 and pt refused to take it after reading the medication's potential side effects.   Venous stasis ulcers of both lower extremities (HCC)    Severe lymphedema.  wound clinic care ongoing as of 01/2018    Past Surgical History:  Procedure Laterality Date   Carotid dopplers  07/23/2018   Left NORMAL.  Right 1-39% ICA stenosis, with <50% distal CCA  stenosis (not hemodynamically significant)   TRANSTHORACIC ECHOCARDIOGRAM  11/2007; 09/2014; 11/2015;12/2017   LV fxn normal, EF normal, mild dilation of left atrium.  2015 grade II diast dysfxn.  2017 EF 55-60%. 2019: LVEF 60-65%, mild RV syst dysf,biatrial enlgmt, mod pulm htn.   URETERAL STENT PLACEMENT     virtual colonoscopy  01/2011   Normal    Outpatient Medications Prior to Visit  Medication Sig Dispense Refill   arginine 500 MG tablet Take by mouth.     b complex vitamins capsule Take 1 capsule by mouth every morning.      butalbital-acetaminophen-caffeine (FIORICET WITH CODEINE) 50-325-40-30 MG capsule TAKE 1 TO 2 CAPSULES BY  MOUTH EVERY 6 HOURS AS  NEEDED FOR HEADACHE(S)  MANUFACTURER RECOMMENDS NOT EXCEEDING 6 CAPSULES/DAY 60 capsule 0   cetirizine (ZYRTEC) 10 MG tablet Take 10 mg by mouth daily.     CHROMIUM GTF PO Take by mouth daily.     cloNIDine (CATAPRES) 0.1 MG tablet 1 tab po tid.  Take an extra tab as needed for bp > 180 on top or >100 on bottom (Patient taking differently: Take 0.1 mg by mouth 2 (two) times daily. 1 tab po tid.  Take an extra tab as needed for bp > 180 on top or >100 on bottom) 90 tablet 3   Coenzyme Q10 (CO Q 10) 10 MG CAPS Take by mouth. Reported on 02/19/2016     furosemide (LASIX) 40 MG tablet TAKE 2 TABLETS BY MOUTH TWO TIMES DAILY (Patient taking differently: Take 80 mg in the mornings and 40 mg in the evenings) 360 tablet 3   Glutamine 500 MG CAPS Take by mouth.     HYDROcodone-acetaminophen (NORCO/VICODIN) 5-325 MG tablet 1-2 tabs po bid prn pain 60 tablet 0   Insulin Lispro Prot & Lispro (HUMALOG MIX 75/25 KWIKPEN) (75-25) 100 UNIT/ML Kwikpen INJECT SUBCUTANEOUSLY 25  UNITS EVERY MORNING AND 20  UNITS EVERY EVENING AT  SUPPER 45 mL 3   lisinopril (ZESTRIL) 40 MG tablet TAKE 1 TABLET BY MOUTH  DAILY 90 tablet 3   metFORMIN (GLUCOPHAGE) 1000 MG tablet TAKE 1 TABLET BY MOUTH  TWICE DAILY WITH MEALS 180 tablet 1   Multiple Vitamin (MULTIVITAMIN) tablet Take 1 tablet by mouth daily.     ONETOUCH ULTRA test strip CHECK BLOOD SUGAR TWICE  DAILY 200 strip 3   rivaroxaban (XARELTO) 20 MG TABS tablet TAKE 1 TABLET BY MOUTH ONCE DAILY WITH SUPPER 90 tablet 3   terazosin (HYTRIN) 10 MG capsule TAKE 1 CAPSULE BY MOUTH  ONCE DAILY AT BEDTIME 90 capsule 3   vitamin E 400 UNIT capsule Take 400 Units by mouth every morning. Selenium 50mg      diltiazem (TIAZAC) 240 MG 24 hr capsule TAKE 1 CAPSULE BY MOUTH  DAILY 90 capsule 1   metoprolol succinate (TOPROL XL) 25 MG 24 hr tablet Take 0.5 tablets  (12.5 mg total) by mouth daily. 15 tablet 6   Insulin Pen Needle (B-D ULTRAFINE III SHORT PEN) 31G X 8 MM MISC USE 1 PENNEEDLE TWICE DAILY 200 each 1   Facility-Administered Medications Prior to Visit  Medication Dose Route Frequency Provider Last Rate Last Admin   penicillin g benzathine (BICILLIN LA) 1200000 UNIT/2ML injection 600,000 Units  600,000 Units Intramuscular Q30 days , MD   600,000 Units at 04/12/20 0933   penicillin g benzathine (BICILLIN LA) 1200000 UNIT/2ML injection 600,000 Units  600,000 Units Intramuscular Q30 days Annessa Satre, 06/12/20, MD   600,000 Units  at 04/04/21 0814   penicillin g benzathine (BICILLIN LA) 1200000 UNIT/2ML injection 600,000 Units  600,000 Units Intramuscular Q30 days [name], MD   600,000 Units at 06/13/21 1148   penicillin g benzathine (BICILLIN LA) 1200000 UNIT/2ML injection 600,000 Units  600,000 Units Intramuscular Q30 days [name], MD   600,000 Units at 06/13/21 1146    Allergies  Allergen Reactions   Other Other (See Comments)    Sneezing, coughing   Hydralazine Other (See Comments)    Drowsiness/sedation/mental fog    ROS As per HPI  PE: Vitals with BMI 07/18/2021 07/11/2021 07/04/2021  Height 5\' 10"  - 5\' 10"   Weight 381 lbs 374 lbs 378 lbs  BMI 54.67 53.66 54.24  Systolic 94 137 118  Diastolic 44 69 56  Pulse 46 61 45    Gen: Alert, well appearing.  Patient is oriented to person, place, time, and situation. AFFECT: pleasant, lucid thought and speech. CV: Irreg irreg, brady to 40s, distant S1 and S2 Chest is clear, no wheezing or rales. Normal symmetric air entry throughout both lung fields. No chest wall deformities or tenderness.   LABS:  Lab Results  Component Value Date   TSH 4.40 06/08/2018   Lab Results  Component Value Date   WBC 8.1 04/04/2021   HGB 13.4 04/04/2021   HCT 43.6 04/04/2021   MCV 84.8 04/04/2021   PLT 236 04/04/2021   Lab Results  Component Value Date   CREATININE  1.51 (H) 07/11/2021   BUN 25 (H) 07/11/2021   NA 138 07/11/2021   K 4.4 07/11/2021   CL 99 07/11/2021   CO2 27 07/11/2021   Lab Results  Component Value Date   ALT 11 04/04/2021   AST 15 04/04/2021   ALKPHOS 70 07/12/2020   BILITOT 1.0 04/04/2021   Lab Results  Component Value Date   CHOL 101 04/04/2021   Lab Results  Component Value Date   HDL 41 04/04/2021   Lab Results  Component Value Date   LDLCALC 46 04/04/2021   Lab Results  Component Value Date   TRIG 61 04/04/2021   Lab Results  Component Value Date   CHOLHDL 2.5 04/04/2021   Lab Results  Component Value Date   PSA 0.39 07/11/2021   PSA 0.35 07/12/2020   PSA 0.71 07/06/2019   Lab Results  Component Value Date   HGBA1C 7.9 (H) 07/11/2021   IMPRESSION AND PLAN:  1) Uncontrolled HTN + bradycardia in the setting of chronic a-fib. Stop toprol. Dec dilt xr to 120 qd. Cont clonidine 0.1 tid and lisinopril 40 qd. After I get HR up some consistently then will work on bp control.  An After Visit Summary was printed and given to the patient.  FOLLOW UP: Return for 7-10 d f/u bp and hr.  Signed:  09/05/2019, MD           07/18/2021
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
## 2021-07-18 NOTE — Progress Notes (Signed)
WAEL, MAESTAS ([id number]) Visit Report for 07/18/2021 SuperBill Details Patient Name: Date of Service: [place] LUCHIANO, VISCOMI 07/18/2021 Medical Record Number: [id number] Patient Account Number: [id number] Date of Birth/Sex: Treating RN: April 28, 1951 (70 y.o. 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 Electronic Signature(s) Signed: 07/18/2021 5:28:57 PM By: Lenda Kelp PA-C Signed: 07/18/2021 6:11:22 PM By: Shawn Stall RN, BSN Entered By: Shawn Stall on 07/18/2021 14:29:48

## 2021-07-18 NOTE — Patient Instructions (Signed)
Stop metoprolol completely.  We are decreasing your diltiazem to 120mg  daily (new rx sent in).

## 2021-07-25 ENCOUNTER — Other Ambulatory Visit: Payer: Self-pay

## 2021-07-25 ENCOUNTER — Encounter (HOSPITAL_BASED_OUTPATIENT_CLINIC_OR_DEPARTMENT_OTHER): Payer: Medicare Other | Admitting: Physician Assistant

## 2021-07-25 DIAGNOSIS — I87333 Chronic venous hypertension (idiopathic) with ulcer and inflammation of bilateral lower extremity: Secondary | ICD-10-CM | POA: Diagnosis not present

## 2021-07-25 DIAGNOSIS — L97519 Non-pressure chronic ulcer of other part of right foot with unspecified severity: Secondary | ICD-10-CM | POA: Diagnosis not present

## 2021-07-25 DIAGNOSIS — I890 Lymphedema, not elsewhere classified: Secondary | ICD-10-CM | POA: Diagnosis not present

## 2021-07-25 DIAGNOSIS — Z794 Long term (current) use of insulin: Secondary | ICD-10-CM | POA: Diagnosis not present

## 2021-07-25 DIAGNOSIS — L97522 Non-pressure chronic ulcer of other part of left foot with fat layer exposed: Secondary | ICD-10-CM | POA: Diagnosis not present

## 2021-07-25 DIAGNOSIS — L97521 Non-pressure chronic ulcer of other 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 of skin: Secondary | ICD-10-CM | POA: Diagnosis not present

## 2021-07-25 DIAGNOSIS — E1140 Type 2 diabetes mellitus with diabetic neuropathy, unspecified: Secondary | ICD-10-CM | POA: Diagnosis not present

## 2021-07-25 DIAGNOSIS — I10 Essential (primary) hypertension: Secondary | ICD-10-CM | POA: Diagnosis not present

## 2021-07-25 DIAGNOSIS — E11622 Type 2 diabetes mellitus with other skin ulcer: Secondary | ICD-10-CM | POA: Diagnosis not present

## 2021-07-25 DIAGNOSIS — E11621 Type 2 diabetes mellitus with foot ulcer: Secondary | ICD-10-CM | POA: Diagnosis not present

## 2021-07-25 DIAGNOSIS — E1121 Type 2 diabetes mellitus with diabetic nephropathy: Secondary | ICD-10-CM | POA: Diagnosis not present

## 2021-07-25 DIAGNOSIS — I87332 Chronic venous hypertension (idiopathic) with ulcer and inflammation of left lower extremity: Secondary | ICD-10-CM | POA: Diagnosis not present

## 2021-07-25 DIAGNOSIS — L97829 Non-pressure chronic ulcer of other part of left lower leg with unspecified severity: Secondary | ICD-10-CM | POA: Diagnosis not present

## 2021-07-25 NOTE — Progress Notes (Addendum)
[name], [name] ([id number]) Visit Report for 07/25/2021 Chief Complaint Document Details Patient Name: Date of Service: [name], [name] 07/25/2021 10:30 A M Medical Record Number: [id number] Patient Account Number: [id number] Date of Birth/Sex: Treating RN: 08-05-1951 (70 y.o. [name] Primary Care Provider: Nicoletta Ba Other Clinician: Referring Provider: Treating Provider/Extender: Adele Dan in Treatment: 287 Information Obtained from: Patient Chief Complaint patient is here for evaluation of venous/lymphedema ulcers Electronic Signature(s) Signed: 07/25/2021 10:55:30 AM By: Lenda Kelp PA-C Entered By: Lenda Kelp on 07/25/2021 10:55:30 -------------------------------------------------------------------------------- HPI Details Patient Name: Date of Service: [name], [name] 07/25/2021 10:30 A M Medical Record Number: [id number] Patient Account Number: [id number] Date of Birth/Sex: Treating RN: 11-Oct-1951 (70 y.o. [name] Primary Care Provider: Nicoletta Ba Other Clinician: Referring Provider: Treating Provider/Extender: Adele Dan in Treatment: 287 History of Present Illness HPI Description: Referred by PCP for consultation. Patient has long standing history of BLE venous stasis, no prior ulcerations. At beginning of month, developed cellulitis and weeping. Received IM Rocephin followed by Keflex and resolved. Wears compression stocking, appr 6 months old. Not sure strength. No present drainage. 01/22/16 this is a patient who is a type II diabetic on insulin. 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 He tells me he religiously wears pressure stockings of uncertain strength. He was here with weeping edema about 8 months ago but did not have an open wound. Roughly a month ago he had a reopening on his bilateral legs. He is been using bandages and Neosporin. He  does not complain of pain. 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 Last BUN/creatinine I have is from 11/20/15 at 13 and 1.0 respectively 01/29/16; patient arrives today having tolerated the Profore wrap. He brought in his stockings and these are 18 mmHg stockings he bought from [employer]. The compression here is likely inadequate. He does not complain of pain or excessive drainage she has no systemic symptoms. The wound on the right looks improved as does the one on the left although one on the left is more substantial with still tissue at risk below the actual wound area on the bilateral posterior calf 02/05/16; patient arrives with poor edema control. He states that we did put a 4 layer compression on it last week. No weight appear 5 this. 02/12/16; the area on the posterior right Has healed. The left Has a substantial wound that has necrotic surface eschar that requires a debridement with a curette. 02/16/16;the patient called or a Nurse visit secondary to increased swelling. He had been in earlier in the week with his right leg healed. He was transitioned to is on pressure stocking on the right leg with the only open wound on the left, a substantial area on the left posterior calf. Note he has a history of severe lower extremity edema, he has a history of chronic atrial fibrillation but not heart failure per my notes but I'll need to research this. He is not complaining of chest pain shortness of breath or orthopnea. The intake nurse noted blisters on the previously closed right leg 02/19/16; this is the patient's regular visit day. I see him on Friday with escalating edema new wounds on the right leg and clear signs of at least right ventricular heart failure. I increased his Lasix to 40 twice a  day. He is returning currently in follow-up. States he is noticed a decrease in that the  edema 02/26/16 patient's legs have much less edema. There is nothing really open on the right leg. The left leg has improved condition of the large superficial wound on the posterior left leg 03/04/16; edema control is very much better. The patient's right leg wounds have healed. On the left leg he continues to have severe venous inflammation on the posterior aspect of the left leg. There is no tenderness and I don't think any of this is cellulitis. 03/11/16; patient's right leg is married healed and he is in his own stocking. The patient's left leg has deteriorated somewhat. There is a lot of erythema around the wound on the posterior left leg. There is also a significant rim of erythema posteriorly just above where the wrap would've ended there is a new wound in this location and a lot of tenderness. Can't rule out cellulitis in this area. 03/15/16; patient's right leg remains healed and he is in his own stocking. The patient's left leg is much better than last review. His major wound on the posterior aspect of his left Is almost fully epithelialized. He has 3 small injuries from the wraps. Really. Erythema seems a lot better on antibiotics 03/18/16; right leg remains healed and he is in his own stocking. The patient's left leg is much better. The area on the posterior aspect of the left calf is fully epithelialized. His 3 small injuries which were wrap injuries on the left are improved only one seems still open his erythema has resolved 03/25/16; patient's right leg remains healed and he is in his own stocking. There is no open area today on the left leg posterior leg is completely closed up. His wrap injuries at the superior aspect of his leg are also resolved. He looks as though he has some irritation on the dorsal ankle but this is fully epithelialized without evidence of infection. 03/28/16; we discharged this patient on Monday. Transitioned him into his own stocking. There were problems almost  immediately with uncontrolled swelling weeping edema multiple some of which have opened. He does not feel systemically unwell in particular no chest pain no shortness of breath and he does not feel 04/08/16; the edema is under better control with the Profore light wrap but he still has pitting edema. There is one large wound anteriorly 2 on the medial aspect of his left leg and 3 small areas on the superior posterior calf. Drainage is not excessive he is tolerating a Profore light well 04/15/16; put a Profore wrap on him last week. This is controlled is edema however he had a lot of pain on his left anterior foot most of his wounds are healed 04/22/16 once again the patient has denuded areas on the left anterior foot which he states are because his wrap slips up word. He saw his primary physician today is on Lasix 40 twice a day and states that he his weight is down 20 pounds over the last 3 months. 04/29/16: Much improved. left anterior foot much improved. He is now on Lasix 80 mg per day. Much improved edema control 05/06/16; I was hoping to be able to discharge him today however once again he has blisters at a low level of where the compression was placed last week mostly on his left lateral but also his left medial leg and a small area on the anterior part of the left foot. 05/09/16; apparently the patient  went home after his appointment on 7/4 later in the evening developing pain in his upper medial thigh together with subjective fever and chills although his temperature was not taken. The pain was so intense he felt he would probably have to call 911. However he then remembered that he had leftover doxycycline from a previous round of antibiotics and took these. By the next morning he felt a lot better. He called and spoke to one of our nurses and I approved doxycycline over the phone thinking that this was in relation to the wounds we had previously seen although they were definitely were not. The  patient feels a lot better old fever no chills he is still working. Blood sugars are reasonably controlled 05/13/16; patient is back in for review of his cellulitis on his anterior medial upper thigh. He is taking doxycycline this is a lot better. Culture I did of the nodular area on the dorsal aspect of his foot grew MRSA this also looks a lot better. 05/20/16; the patient is cellulitis on the medial upper thigh has resolved. All of his wound areas including the left anterior foot, areas on the medial aspect of the left calf and the lateral aspect of the calf at all resolved. He has a new blister on the left dorsal foot at the level of the fourth toe this was excised. No evidence of infection 05/27/16; patient continues to complain weeping edema. He has new blisterlike wounds on the left anterior lateral and posterior lateral calf at the top of his wrap levels. The area on his left anterior foot appears better. He is not complaining of fever, pain or pruritus in his feet. 05/30/16; the patient's blisters on his left anterior leg posterior calf all look improved. He did not increase the Lasix 100 mg as I suggested because he was going to run out of his 40 mg tablets. He is still having weeping edema of his toes 06/03/16; I renewed his Lasix at 80 mg once a day as he was about to run out when I last saw him. He is on 80 mg of Lasix now. I have asked him to cut down on the excessive amount of water he was drinking and asked him to drink according to his thirst mechanisms 06/12/2016 -- was seen 2 days ago and was supposed to wear his compression stockings at home but he is developed lymphedema and superficial blisters on the left lower extremity and hence came in for a review 06/24/16; the remaining wound is on his left anterior leg. He still has edema coming from between his toes. There is lymphedema here however his edema is generally better than when I last saw this. He has a history of atrial fibrillation  but does not have a known history of congestive heart failure nevertheless I think he probably has this at least on a diastolic basis. 07/01/16 I reviewed his echocardiogram from January 2017. This was essentially normal. He did not have LVH, EF of 55-60%. His right ventricular function was normal although he did have trivial tricuspid and pulmonic regurgitation. This is not audible on exam however. I increased his Lasix to do massive edema in his legs well above his knees I think in early July. He was also drinking an excessive amount of water at the time. 07/15/16; missed his appointment last week because of the Labor Day holiday on Monday. He could not get another appointment later in the week. Started to feel the wrap digging in superiorly  so we remove the top half and the bottom half of his wrap. He has extensive erythema and blistering superiorly in the left leg. Very tender. Very swollen. Edema in his foot with leaking edema fluid. He has not been systemically unwell 07/22/16; the area on the left leg laterally required some debridement. The medial wounds look more stable. His wrap injury wounds appear to have healed. Edema and his foot is better, weeping edema is also better. He tells me he is meeting with the supplier of the external compression pumps at work 08/05/16; [place] His wrap is been on for an extended period of time. Also over the weekend he developed an extensive area of tender erythema across his anterior medial thigh. He took to doxycycline yesterday that he had leftover from a previous prescription. The patient complains of weeping edema coming out of his toes 08/08/16; I saw this patient on 10/2. He was tender across his anterior thigh. I put him on doxycycline. He returns today in follow-up. He does not have any open wounds on his lower leg, he still has edema weeping into his toes. 08/12/16; patient was seen back urgently today to  follow-up for his extensive left thigh cellulitis/erysipelas. He comes back with a lot less swelling and erythema pain is much better. I believe I gave him Augmentin and Cipro. His wrap was cut down as he stated a roll down his legs. He developed blistering above the level of the wrap that remained. He has 2 open blisters and 1 intact. 08/19/16; patient is been doing his primary doctor who is increased his Lasix from 40-80 once a day or 80 already has less edema. Cellulitis has remained improved in the left thigh. 2 open areas on the posterior left calf 08/26/16; he returns today having new open blisters on the anterior part of his left leg. He has his compression pumps but is not yet been shown how to use some vital representative from the supplier. 09/02/16 patient returns today with no open wounds on the left leg. Some maceration in his plantar toes 09/10/2016 -- Dr. Leanord Hawkingobson had recently discharged him on 09/02/2016 and he has come right back with redness swelling and some open ulcers on his left lower extremity. He says this was caused by trying to apply his compression stockings and he's been unable to use this and has not been able to use his lymphedema pumps. He had some doxycycline leftover and he has started on this a few days ago. 09/16/16; there are no open wounds on his leg on the left and no evidence of cellulitis. 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 He does not complain of symptoms here. I am not clear use using his external compression pumps. 09/23/16; I have not seen this patient in 2 weeks. He canceled his appointment 10 days ago as he was going on vacation. He tells me that on Monday he noticed a large area on his posterior left leg which is been draining copiously and is reopened into a large wound. He is been using ABDs and the external part of his juxtalite, according to our nurse this was not on properly. 10/07/16;  Still a substantial area on the posterior left leg. Using silver alginate 10/14/16; in general better although there is still open area which looks healthy. Still using silver alginate. He reminds me that this happen [place] T oday while he was showering in the morning. He had been using his juxtalite's 10/21/16; the area on his posterior left leg is fully epithelialized. However he arrives today with a large area of tender erythema in his medial and posterior left thigh just above the knee. I have marked the area. Once again he is reluctant to consider hospitalization. I treated him with oral antibiotics in the past for a similar situation with resolution I think with doxycycline however this area it seems more extensive to me. He is not complaining of fever but does have chills and says states he is thirsty. His blood sugar today was in the 140s at home 10/25/16 the area on his posterior left leg is fully epithelialized although there is still some weeping edema. The large area of tenderness and erythema in his medial and posterior left thigh is a lot less tender although there is still a lot of swelling in this thigh. He states he feels a lot better. He is on doxycycline and Augmentin that I started last week. This will continued until Tuesday, December 26. I have ordered a duplex ultrasound of the left thigh rule out DVT whether there is an abscess something that would need to be drained I would also like to know. 11/01/16; he still has weeping edema from a not fully epithelialized area on his left posterior calf. Most of the rest of this looks a lot better. He has completed his antibiotics. His thigh is a lot better. Duplex ultrasound did not show a DVT in the thigh 11/08/16; he comes in today with more Denuded surface epithelium from the posterior aspect of his calf. There is no real evidence of cellulitis. The superior aspect of his wrap appears to have put quite an  indentation in his leg just below the knee and this may have contributed. He does not complain of pain or fever. We have been using silver alginate as the primary dressing. The area of cellulitis in the right thigh has totally resolved. He has been using his compression stockings once a week 11/15/16; the patient arrives today with more loss of epithelium from the posterior aspect of his left calf. He now has a fairly substantial wound in this area. The reason behind this deterioration isn't exactly clear although his edema is not well controlled. He states he feels he is generally more swollen systemically. He is not complaining of chest pain shortness of breath fever. T me he has an appointment with his primary physician in early February. He is on 80 mg of oral ells Lasix a day. He claims compliance with the external compression pumps. He is not having any pain in his legs similar to what he has with his recurrent cellulitis 11/22/16; the patient arrives a follow-up of his large area on his left lateral calf. This looks somewhat better today. He came in earlier in the week for a dressing change since I saw him a week ago. He is not complaining of any pain no shortness of breath no chest pain 11/28/16; the patient arrives for follow-up of his large area on the left lateral calf this does not look better. In fact it is larger weeping edema. The surface of the wound does not look too bad. We have been using silver alginate although I'm not certain that this is a dressing issue. 12/05/16; again the patient follows up for a large wound on the left lateral and left posterior calf this does not look better. There  continues to be weeping edema necrotic surface tissue. More worrisome than this once again there is erythema below the wound involving the distal Achilles and heel suggestive of cellulitis. He is on his feet working most of the day of this is not going well. We are changing his dressing twice a week to  facilitate the drainage. 12/12/16; not much change in the overall dimensions of the large area on the left posterior calf. This is very inflamed looking. I gave him an. Doxycycline last week does not really seem to have helped. He found the wrap very painful indeed it seems to of dog into his legs superiorly and perhaps around the heel. He came in early today because the drainage had soaked through his dressings. 12/19/16- patient arrives for follow-up evaluation of his left lower extremity ulcers. He states that he is using his lymphedema pumps once daily when there is "no drainage". He admits to not using his lipedema pumps while under current treatment. His blood sugars have been consistently between 150-200. 12/26/16; the patient is not using his compression pumps at home because of the wetness on his feet. I've advised him that I think it's important for him to use this daily. He finds his feet too wet, he can put a plastic bag over his legs while he is in the pumps. Otherwise I think will be in a vicious circle. We are using silver alginate to the major area on his left posterior calf 01/02/17; the patient's posterior left leg has further of all into 3 open wounds. All of them covered with a necrotic surface. He claims to be using his compression pumps once a day. His edema control is marginal. Continue with silver alginate 01/10/17; the patient's left posterior leg actually looks somewhat better. There is less edema, less erythema. Still has 3 open areas covered with a necrotic surface requiring debridement. He claims to be using his compression pumps once a day his edema control is better 01/17/17; the patient's left posterior calf look better last week when I saw him and his wrap was changed 2 days ago. He has noted increasing pain in the left heel and arrives today with much larger wounds extensive erythema extending down into the entire heel area especially tender medially. He is not  systemically unwell CBGs have been controlled no fever. Our intake nurse showed me limegreen drainage on his AVD pads. 01/24/17; his usual this patient responds nicely to antibiotics last week giving him Levaquin for presumed Pseudomonas. The whole entire posterior part of his leg is much better much less inflamed and in the case of his Achilles heel area much less tender. He has also had some epithelialization posteriorly there are still open areas here and still draining but overall considerably better 01/31/17- He has continue to tolerate the compression wraps. he states that he continues to use the lymphedema pumps daily, and can increase to twice daily on the weekends. He is voicing no complaints or concerns regarding his LLE ulcers 02/07/17-he is here for follow-up evaluation. He states that he noted some erythema to the left medial and anterior thigh, which he states is new as of yesterday. He is concerned about recurrent cellulitis. He states his blood sugars have been slightly elevated, this morning in the 180s 02/14/17; he is here for follow-up evaluation. When he was last here there was erythema superiorly from his posterior wound in his anterior thigh. He was prescribed Levaquin however a culture of the wound surface grew MRSA over  the phone I changed him to doxycycline on Monday and things seem to be a lot better. 02/24/17; patient missed his appointment on Friday therefore we changed his nurse visit into a physician visit today. Still using silver alginate on the large area of the posterior left thigh. He isn't new area on the dorsal left second toe 03/03/17; actually better today although he admits he has not used his external compression pumps in the last 2 days or so because of work responsibilities over the weekend. 03/10/17; continued improvement. External compression pumps once a day almost all of his wounds have closed on the posterior left calf. Better edema control 03/17/17; in general  improved. He still has 3 small open areas on the lateral aspect of his left leg however most of the area on the posterior part of his leg is epithelialized. He has better edema control. He has an ABD pad under his stocking on the right anterior lower leg although he did not let us look at that today. 03/24/17; patient arrives back in clinic today with no open areas however there are areas on the posterior left calf and anterior left calf that are less than 100% epithelialized. His edema is well controlled in the left lower leg. There is some pitting edema probably lymphedema in the left upper thigh. He uses compression pumps at home once per day. I tried to get him to do this twice a day although he is very reticent. 04/01/2017 -- for the last 2 days he's had significant redness, tenderness and weeping and came in for an urgent visit today. 04/07/17; patient still has 6 more days of doxycycline. He was seen by Dr. Meyer Russel last Wednesday for cellulitis involving the posterior aspect, lateral aspect of his Involving his heel. For the most part he is better there is less erythema and less weeping. He has been on his feet for 12 hours 2 over the weekend. Using his compression pumps once a day 04/14/17 arrives today with continued improvement. Only one area on the posterior left calf that is not fully epithelialized. He has intense bilateral venous inflammation associated with his chronic venous insufficiency disease and secondary lymphedema. We have been using silver alginate to the left posterior calf wound In passing he tells [place] today that the right leg but we have not seen in quite some time has an open area on it but he doesn't want [place] to look at this today states he will show this to [place] next week. 04/21/17; there is no open area on his left leg although he still reports some weeping edema. He showed [place] his right leg today which is the first time we've seen this leg in a long time. He has a large area of  open wound on the right leg anteriorly healthy granulation. Quite a bit of swelling in the right leg and some degree of venous inflammation. He told us about the right leg in passing last week but states that deterioration in the right leg really only happened over the weekend 04/28/17; there is no open area on the left leg although there is an irritated part on the posterior which is like a wrap injury. The wound on the right leg which was new from last week at least to [place] is a lot better. 05/05/17; still no open area on the left leg. Patient is using his new compression stocking which seems to be doing a good job of controlling the edema. He states he is using his  compression pumps once per day. The right leg still has an open wound although it is better in terms of surface area. Required debridement. A lot of pain in the posterior right Achilles marked tenderness. Usually this type of presentation this patient gives concern for an active cellulitis 05/12/17; patient arrives today with his major wound from last week on the right lateral leg somewhat better. Still requiring debridement. He was using his compression stocking on the left leg however that is reopened with superficial wounds anteriorly he did not have an open wound on this leg previously. He is still using his juxta light's once daily at night. He cannot find the time to do this in the morning as he has to be at work by 7 AM 05/19/17; right lateral leg wound looks improved. No debridement required. The concerning area is on the left posterior leg which appears to almost have a subcutaneous hemorrhagic component to it. We've been using silver alginate to all the wounds 05/26/17; the right lateral leg wound continues to look improved. However the area on the left posterior calf is a tightly adherent surface. [place] using silver alginate. Because of the weeping edema in his legs there is very little good alternatives. 06/02/17; the patient left  here last week looking quite good. Major wound on the left posterior calf and a small one on the right lateral calf. Both of these look satisfactory. He tells me that by Wednesday he had noted increased pain in the left leg and drainage. He called on Thursday and Friday to get an appointment here but we were blocked. He did not go to urgent care or his primary physician. He thinks he had a fever on Thursday but did not actually take his temperature. He has not been using his compression pumps on the left leg because of pain. I advised him to go to the emergency room today for IV antibiotics for stents of left leg cellulitis but he has refused I have asked him to take 2 days off work to keep his leg elevated and he has refused this as well. In view of this I'm going to call him and Augmentin and doxycycline. 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 However after a detailed discussion regarding the risk benefits and alternatives he refuses to get admitted to the [name]. With no other recourse I will continue him on oral antibiotics as before and hopefully he'll have his infectious disease consultation this week. 06/16/2017 -- the patient was seen today by the nurse practitioner at infectious disease Ms. Dixon. 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 He has also been advise Lotrimin cream locally. she also had wise IV antibiotics for his condition if it flares up 06/23/17; patient arrives today with drainage bilaterally although the remaining wound on the left posterior calf after  cleaning up today "highlighter yellow drainage" did not look too bad. Unfortunately he has had breakdown on the right anterior leg [previously this leg had not been open and he is using a black stocking] he went to see infectious  disease and is been put on clindamycin 150 mg daily, I did not verify the dose although I'm not familiar with using clindamycin in this dosing range, perhaps for prophylaxisoo 06/27/17; I brought this patient back today to follow-up on the wound deterioration on the right lower leg together with surrounding cellulitis. I started him on doxycycline 4 days ago. This area looks better however he comes in today with intense cellulitis on the medial part of his left thigh. This is not have a wound in this area. Extremely tender. We've been using silver alginate to the wounds on the right lower leg left lower leg with bilateral 4 layer compression he is using his external compression pumps once a day 07/04/17; patient's left medial thigh cellulitis looks better. He has not been using his compression pumps as his insert said it was contraindicated with cellulitis. His right leg continues to make improvements all the wounds are still open. We only have one remaining wound on the left posterior calf. Using silver alginate to all open areas. He is on doxycycline which I started a week ago and should be finishing I gave him Augmentin after Thursday's visit for the severe cellulitis on the left medial thigh which fortunately looks better 07/14/17; the patient's left medial thigh cellulitis has resolved. The cellulitis in his right lower calf on the right also looks better. All of his wounds are stable to improved we've been using silver alginate he has completed the antibiotics I have given him. He has clindamycin 150 mg once a day prescribed by infectious disease for prophylaxis, I've advised him to start this now. We have been using bilateral Unna boots over silver alginate to the wound  areas 07/21/17; the patient is been to see infectious disease who noted his recurrent problems with cellulitis. He was not able to tolerate prophylactic clindamycin therefore he is on amoxicillin 500 twice a day. He also had a second daily dose of Lasix added By Dr. Oneta Rack but he is not taking this. Nor is he being completely compliant with his compression pumps a especially not this week. He has 2 remaining wounds one on the right posterior lateral lower leg and one on the left posterior medial lower leg. 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 The patient has Unna boots bilaterally. 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 He has bilateral Unna boots for compression and silver alginate to his wounds. Arrives today with his legs looking as good as I have seen him in quite some time. Not surprisingly his wounds look better as well with improvement on the right lateral leg venous insufficiency wound and also the left medial leg. He is still using the compression pumps once a day 08/11/17; both legs appear to be doing better wounds on the right lateral and left medial legs look better. Skin on the right leg quite good. He is been using silver alginate as the primary dressing. I'm going to use Anasept gel calcium alginate and maintain all the secondary dressings 08/18/17; the patient continues to actually do quite well. The area on his right lateral leg is just about closed the left medial also looks better although it is still moist in this area. His edema is well controlled we have been using Anasept gel with calcium alginate  and the usual secondary dressings, 4 layer compression and once daily use of his compression pumps "always been able to manage 09/01/17; the patient  continues to do reasonably well in spite of his trip to [place]. The area on the right lateral leg is epithelialized. Left is much better but still open. He has more edema and more chronic erythema on the left leg [venous inflammation] 09/08/17; he arrives today with no open wound on the right lateral leg and decently controlled edema. Unfortunately his left leg is not nearly as in his good situation as last week.he apparently had increasing edema starting on Saturday. He edema soaked through into his foot so used a plastic bag to walk around his home. The area on the medial right leg which was his open area is about the same however he has lost surface epithelium on the left lateral which is new and he has significant pain in the Achilles area of the left foot. He is already on amoxicillin chronically for prophylaxis of cellulitis in the left leg 09/15/17; he is completed a week of doxycycline and the cellulitis in the left posterior leg and Achilles area is as usual improved. He still has a lot of edema and fluid soaking through his dressings. There is no open wound on the right leg. He saw infectious disease NP today 09/22/17;As usual 1 we transition him from our compression wraps to his stockings things did not go well. He has several small open areas on the right leg. He states this was caused by the compression wrap on his skin although he did not wear this with the stockings over them. He has several superficial areas on the left leg medially laterally posteriorly. He does not have any evidence of active cellulitis especially involving the left Achilles The patient is traveling from [facility] Saturday going to [name]. He states he isn't attempting to get an appointment with a heel objects wound center there to change his dressings. I am not completely certain whether this will work 10/06/17; the patient came in on Friday for a nurse visit and the nurse reported that his legs actually look  quite good. He arrives in clinic today for his regular follow-up visit. He has a new wound on his left third toe over the PIP probably caused by friction with his footwear. He has small areas on the left leg and a very superficial but epithelialized area on the right anterior lateral lower leg. Other than that his legs look as good as I've seen him in quite some time. We have been using silver alginate Review of systems; no chest pain no shortness of breath other than this a 10 point review of systems negative 10/20/17; seen by Dr. Meyer Russel last week. He had taken some antibiotics [doxycycline] that he had left over. Dr. Meyer Russel thought he had candida infection and declined to give him further antibiotics. He has a small wound remaining on the right lateral leg several areas on the left leg including a larger area on the left posterior several left medial and anterior and a small wound on the left lateral. The area on the left dorsal third toe looks a lot better. ROS; Gen.; no fever, respiratory no cough no sputum Cardiac no chest pain other than this 10 point review of [name] is negative 10/30/17; patient arrives today having fallen in the bathtub 3 days ago. It took him a while to get up. He has pain and maceration in the wounds on his  left leg which have deteriorated. He has not been using his pumps he also has some maceration on the right lateral leg. 11/03/17; patient continues to have weeping edema especially in the left leg. This saturates his dressings which were just put on on 12/27. As usual the doxycycline seems to take care of the cellulitis on his lower leg. He is not complaining of fever, chills, or other systemic symptoms. He states his leg feels a lot better on the doxycycline I gave him empirically. He also apparently gets injections at his primary doctor's officeo Rocephin for cellulitis prophylaxis. I didn't ask him about his compression pump compliance today I think that's probably  marginal. Arrives in the clinic with all of his dressings primary and secondary macerated full of fluid and he has bilateral edema 11/10/17; the patient's right leg looks some better although there is still a cluster of wounds on the right lateral. The left leg is inflamed with almost circumferential skin loss medially to laterally although we are still maintaining anteriorly. He does not have overt cellulitis there is a lot of drainage. He is not using compression pumps. We have been using silver alginate to the wound areas, there are not a lot of options here 11/17/17; the patient's right leg continues to be stable although there is still open wounds, better than last week. The inflammation in the left leg is better. Still loss of surface layer epithelium especially posteriorly. There is no overt cellulitis in the amount of edema and his left leg is really quite good, tells me he is using his compression pumps once a day. 11/24/17; patient's right leg has a small superficial wound laterally this continues to improve. The inflammation in the left leg is still improving however we have continuous surface layer epithelial loss posteriorly. There is no overt cellulitis in the amount of edema in both legs is really quite good. He states he is using his compression pumps on the left leg once a day for 5 out of 7 days 12/01/17; very small superficial areas on the right lateral leg continue to improve. Edema control in both legs is better today. He has continued loss of surface epithelialization and left posterior calf although I think this is better. We have been using silver alginate with large number of absorptive secondary dressings 4 layer on the left Unna boot on the right at his request. He tells me he is using his compression pumps once a day 12/08/17; he has no open area on the right leg is edema control is good here. On the left leg however he has marked erythema and tenderness breakdown of skin. He has  what appears to be a wrap injury just distal to the popliteal fossa. 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 There was swelling erythema and breakdown in the right leg. He has much worse skin breakdown in the left leg as well multiple open areas medially and posteriorly as well as laterally. He tells me he has been using his compression pumps but tells me he feels that the drainage out of his leg is worse when he uses a compression pumps. T be fair to him he is been saying this o for a while however I  don't know that I have really been listening to this. I wonder if the compression pumps are working properly 12/22/17;. Once again he arrives with severe erythema, weeping edema from the left greater than right leg. Noncompliance with compression pumps. New this visit he is complaining of pain on the lateral aspect of the right leg and the medial aspect of his right thigh. He apparently saw his cardiologist Dr. [name] who was ordered an echocardiogram area and I think this is a step in the right direction 12/25/17; started his doxycycline Monday night. There is still intense erythema of the right leg especially in the anterior thigh although there is less tenderness. The erythema around the wound on the right lateral calf also is less tender. He still complaining of pain in the left heel. His wounds are about the same right lateral left medial left lateral. Superficial but certainly not close to closure. He denies being systemically unwell no fever chills no abdominal pain no diarrhea 12/29/17; back in follow-up of his extensive right calf and right thigh cellulitis. I added amoxicillin to cover possible doxycycline resistant  strep. This seems to of done the trick he is in much less pain there is much less erythema and swelling. He has his echocardiogram at 11:00 this morning. X-ray of the left heel was also negative. 01/05/18; the patient arrived with his edema under much better control. Now that he is retired he is able to use his compression pumps daily and sometimes twice a day per the patient. He has a wound on the right leg the lateral wound looks better. Area on the left leg also looks a lot better. He has no evidence of cellulitis in his bilateral thighs I had a quick peak at his echocardiogram. He is in normal ejection fraction and normal left ventricular function. He has moderate pulmonary hypertension moderately reduced right ventricular function. One would have to wonder about chronic sleep apnea although he says he doesn't snore. He'll review the echocardiogram with his cardiologist. 01/12/18; the patient arrives with the edema in both legs under exemplary control. He is using his compression pumps daily and sometimes twice daily. His wound on the right lateral leg is just about closed. He still has some weeping areas on the posterior left calf and lateral left calf although everything is just about closed here as well. I have spoken with Aldean Baker who is the patient's nurse practitioner and infectious disease. She was concerned that the patient had not understood that the parenteral penicillin injections he was receiving for cellulitis prophylaxis was actually benefiting him. I don't think the patient actually saw that I would tend to agree we were certainly dealing with less infections although he had a serious one last month. 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 He is healed. He'll be placed in juxtalite compression wraps and increase his lymphedema pumps to twice daily. We will follow up again next week to ensure there are no issues with the new  regiment. 01/20/18-he is here for evaluation of bilateral lower extremity weeping edema. Yesterday he was placed in compression wrap to the right lower extremity and compression stocking to left lower shrubbery. He states he uses lymphedema pumps last night and again this morning and noted a blister to the left lower extremity. On exam he was noted to have drainage to the right lower extremity. He will be placed in Unna boots bilaterally and follow-up next week 01/26/18; patient was actually discharged a  week ago to his own juxta light stockings only to return the next day with bilateral lower extremity weeping edema.he was placed in bilateral Unna boots. He arrives today with pain in the back of his left leg. There is no open area on the right leg however there is a linear/wrap injury on the left leg and weeping edema on the left leg posteriorly. I spoke with infectious disease about 10 days ago. They were disappointed that the patient elected to discontinue prophylactic intramuscular penicillin shots as they felt it was particularly beneficial in reducing the frequency of his cellulitis. I discussed this with the patient today. He does not share this view. He'll definitely need antibiotics today. Finally he is traveling to [place] and trauma leaving this Saturday and returning a week later and he does not travel with his pumps. He is going by car 01/30/18; patient was seen 4 days ago and brought back in today for review of cellulitis in the left leg posteriorly. I put him on amoxicillin this really hasn't helped as much as I might like. He is also worried because he is traveling to [facility] by car. Finally we will be rewrapping him. There is no open area on the right leg over his left leg has multiple weeping areas as usual 02/09/18; The same wrap on for 10 days. He did not pick up the last doxycycline I prescribed for him. He apparently took 4 days worth he already had. There is nothing open on  his right leg and the edema control is really quite good. He's had damage in the left leg medially and laterally especially probably related to the prolonged use of Unna boots 02/12/18; the patient arrived in clinic today for a nurse visit/wrap change. He complained of a lot of pain in the left posterior calf. He is taking doxycycline that I previously prescribed for him. Unfortunately even though he used his stockings and apparently used to compression pumps twice a day he has weeping edema coming out of the lateral part of his right leg. This is coming from the lower anterior lateral skin area. 02/16/18; the patient has finished his doxycycline and will finish the amoxicillin 2 days. The area of cellulitis in the left calf posteriorly has resolved. He is no longer having any pain. He tells me he is using his compression pumps at least once a day sometimes twice. 02/23/18; the patient finished his doxycycline and Amoxil last week. On Friday he noticed a small erythematous circle about the size of a quarter on the left lower leg just above his ankle. This rapidly expanded and he now has erythema on the lateral and posterior part of the thigh. This is bright red. Also has an area on the dorsal foot just above his toes and a tender area just below the left popliteal fossa. He came off his prophylactic penicillin injections at his own insistence one or 2 months ago. This is obviously deteriorated since then 03/02/18; patient is on doxycycline and Amoxil. Culture I did last week of the weeping area on the back of his left calf grew group B strep. I have therefore renewed the amoxicillin 500 3 times a day for a further week. He has not been systemically unwell. Still complaining of an area of discomfort right under his left popliteal fossa. There is no open wound on the right leg. He tells me that he is using his pumps twice a day on most days 03/09/18; patient arrives in clinic today completing  his amoxicillin  today. The cellulitis on his left leg is better. Furthermore he tells me that he had intramuscular penicillin shots that his primary care office today. However he also states that the wrap on his right leg fell down shortly after leaving clinic last week. He developed a large blister that was present when he came in for a nurse visit later in the week and then he developed intense discomfort around this area.He tells me he is using his compression pumps 03/16/18; the patient has completed his doxycycline. The infectious part of this/cellulitis in the left heel area left popliteal area is a lot better. He has 2 open areas on the right calf. Still areas on the left calf but this is a lot better as well. 03/24/18; the patient arrives complaining of pain in the left popliteal area again. He thinks some of this is wrap injury. He has no open area on the right leg and really no open area on the left calf either except for the popliteal area. He claims to be compliant with the compression pumps 03/31/18; I gave him doxycycline last week because of cellulitis in the left popliteal area. This is a lot better although the surface epithelium is denuded off and response to this. He arrives today with uncontrolled edema in the right calf area as well as a fingernail injury in the right lateral calf. There is only a few open areas on the left 04/06/18; I gave him amoxicillin doxycycline over the last 2 weeks that the amoxicillin should be completing currently. He is not complaining of any pain or systemic symptoms. The only open areas see has is on the right lateral lower leg paradoxically I cannot see anything on the left lower leg. He tells me he is using his compression pumps twice a day on most days. Silver alginate to the wounds that are open under 4 layer compression 04/13/18; he completed antibiotics and has no new complaints. Using his compression pumps. Silver alginate that anything that's opened 04/20/18; he is  using his compression pumps religiously. Silver alginate 4 layer compression anything that's opened. He comes in today with no open wounds on the left leg but 3 on the right including a new one posteriorly. He has 2 on the right lateral and one on the right posterior. He likes Unna boots on the right leg for reasons that aren't really clear we had the usual 4 layer compression on the left. It may be necessary to move to the 4 layer compression on the right however for now I left them in the Unna boots 04/27/18; he is using his compression pumps at least once a day. He has still the wounds on the right lateral calf. The area right posteriorly has closed. He does not have an open wound on the left under 4 layer compression however on the dorsal left foot just proximal to the toes and the left third toe 2 small open areas were identified 05/11/18; he has not uses compression pumps. The areas on the right lateral calf have coalesced into one large wound necrotic surface. On the left side he has one small wound anteriorly however the edema is now weeping out of a large part of his left leg. He says he wasn't using his pumps because of the weeping fluid. I explained to him that this is the time he needs to pump more 05/18/18; patient states he is using his compression pumps twice a day. The area on the right lateral  large wound albeit superficial. On the left side he has innumerable number of small new wounds on the left calf particularly laterally but several anteriorly and medially. All these appear to have healthy granulated base these look like the remnants of blisters however they occurred under compression. The patient arrives in clinic today with his legs somewhat better. There is certainly less edema, less multiple open areas on the left calf and the right anterior leg looks somewhat better as well superficial and a little smaller. However he relates pain and erythema over the last 3-4 days in the thigh  and I looked at this today. He has not been systemically unwell no fever no chills no change in blood sugar values 05/25/18; comes in today in a better state. The severe cellulitis on his left leg seems better with the Keflex. Not as tender. He has not been systemically unwell Hard to find an open wound on the left lower leg using his compression pumps twice a day The confluent wounds on his right lateral calf somewhat better looking. These will ultimately need debridement I didn't do this today. 06/01/18; the severe cellulitis on the left anterior thigh has resolved and he is completed his Keflex. There is no open wound on the left leg however there is a superficial excoriation at the base of the third toe dorsally. Skin on the bottom of his left foot is macerated looking. The left the wounds on the lateral right leg actually looks some better although he did require debridement of the top half of this wound area with an open curet 06/09/18 on evaluation today patient appears to be doing poorly in regard to his right lower extremity in particular this appears to likely be infected he has very thick purulent discharge along with a bright green tent to the discharge. This makes me concerned about the possibility of pseudomonas. He's also having increased discomfort at this point on evaluation. Fortunately there does not appear to be any evidence of infection spreading to the other location at this time. 06/16/18 on evaluation today patient appears to actually be doing fairly well. His ulcer has actually diminished in size quite significantly at this point which is good news. Nonetheless he still does have some evidence of infection he did see infectious disease this morning before coming here for his appointment. I did review the results of their evaluation and their note today. They did actually have him discontinue the Cipro and initiate treatment with linezolid at this time. He is doing this for the  next seven days and they recommended a follow-up in four months with them. He is the keep a log of the need for intermittent antibiotic therapy between now and when he falls back up with infectious disease. This will help them gaze what exactly they need to do to try and help them out. 06/23/18; the patient arrives today with no open wounds on the left leg and left third toe healed. He is been using his compression pumps twice a day. On the right lateral leg he still has a sizable wound but this is a lot better than last time I saw this. 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 Has been using silver alginate under 4 layer compression 06/30/18; the only open wound he has is on the right lateral leg and this looks healthy. No debridement is required. We have been using silver  alginate. He does not have an open wound on the left leg. There is apparently some drainage from the dorsal proximal third toe on the left although I see no open wound here. 07/03/18 on evaluation today patient was actually here just for a nurse visit rapid change. However when he was here on Wednesday for his rat change due to having been healed on the left and then developing blisters we initiated the wrap again knowing that he would be back today for [place] to reevaluate and see were at. Unfortunately he has developed some cellulitis into the proximal portion of his right lower extremity even into the region of his thigh. 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 He was placed on one as what at that point. Nonetheless he is done with that and has been tolerating it well otherwise. Doxycycline which in the past really did not seem to be effective for him. Nonetheless I think the best option may be for [place] to definitely reinitiate the antibiotics for a longer period of time. 07/07/18; since I  last saw this patient a week ago he has had a difficult time. At that point he did not have an open wound on his left leg. We transitioned him into juxta light stockings. He was apparently in the clinic the next day with blisters on the left lateral and left medial lower calf. He also had weeping edema fluid. He was put back into a compression wrap. He was also in the clinic on Friday with intense erythema in his right thigh. Per the patient he was started on Bactrim however that didn't work at all in terms of relieving his pain and swelling. He has taken 3 doxycycline that he had left over from last time and that seems to of helped. He has blistering on the right thigh as well. 07/14/18; the erythema on his right thigh has gotten better with doxycycline that he is finishing. 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 Presumably this cellulitis in the thigh was not related to that although I think this is a bit concerning going forward. He still has an area on the right lateral calf the blister on the right medial calf just below the knee that was discussed above. On the left 2 small open areas left medial and left lateral. Edema control is adequate. He is using his compression pumps twice a day 07/20/18; continued improvement in the condition of both legs especially the edema in his bilateral thighs. He tells me he is been losing weight through a combination of diet and exercise. He is using his compression pumps twice a day. So overall she made to the remaining wounds 07/27/2018; continued improvement in condition of both legs. His edema is well controlled. The area on the right lateral leg is just about closed he had one blisters show up on the medial left upper calf. We have him in 4 layer compression. He is going on a 10-day trip to [place], [place] and [place]. He will be driving. He wants to wear Unna boots because of  the lessening amount of constriction. He will not use compression pumps while he is away 08/05/18 on evaluation today patient actually appears to be doing decently well all things considered in regard to his bilateral lower extremities. The worst ulcer is actually only posterior aspect of his left lower  extremity with a four layer compression wrap cut into his leg a couple weeks back. He did have a trip and actually had [employer] for the trip that he is worn since he was last here. Nonetheless he feels like the [employer] actually do better for him his swelling is up a little bit but he also with his trip was not taking his Lasix on a regular set schedule like he was supposed to be. He states that obviously the reason being that he cannot drive and keep going without having to urinate too frequently which makes it difficult. He did not have his pumps with him while he was away either which I think also maybe playing a role here too. 08/13/2018; the patient only has a small open wound on the right lateral calf which is a big improvement in the last month or 2. He also has the area posteriorly just below the posterior fossa on the left which I think was a wrap injury from several weeks ago. He has no current evidence of cellulitis. He tells me he is back into his compression pumps twice a day. He also tells me that while he was at the laundromat somebody stole a section of his extremitease stockings 08/20/2018; back in the clinic with a much improved state. He only has small areas on the right lateral mid calf which is just about healed. This was is more substantial area for quite a prolonged period of time. He has a small open area on the left anterior tibia. The area on the posterior calf just below the popliteal fossa is closed today. He is using his compression pumps twice a day 08/28/2018; patient has no open wound on the right leg. He has a smattering of open areas on the calf with some  weeping lymphedema. More problematically than that it looks as though his wraps of slipped down in his usual he has very angry upper area of edema just below the right medial knee and on the right lateral calf. He has no open area on his feet. The patient is traveling to [facility] next week. I will send him in an antibiotic. We will continue to wrap the right leg. We ordered extremitease stockings for him last week and I plan to transition the right leg to a stocking when he gets home which will be in 10 days time. As usual he is very reluctant to take his pumps with him when he travels 09/07/2018; patient returns from [name]. He shows me a picture of his left leg in the mid part of his trip last week with intense fire engine erythema. The picture look bad enough I would have considered sending him to the [name]. Instead he went to the wound care center in [facility]. They did not prescribe him antibiotics but he did take some doxycycline he had leftover from a previous visit. I had given him trimethoprim sulfamethoxazole before he left this did not work according to the patient. This is resulted in some improvement fortunately. He comes back with a large wound on the left posterior calf. Smaller area on the left anterior tibia. Denuded blisters on the dorsal left foot over his toes. Does not have much in the way of wounds on the right leg although he does have a very tender area on the right posterior area just below the popliteal fossa also suggestive of infection. He promises me he is back on his pumps twice a  day 09/15/2018; the intense cellulitis in his left lower calf is a lot better. The wound area on the posterior left calf is also so better. However he has reasonably extensive wounds on the dorsal aspect of his second and third toes and the proximal foot just at the base of the toes. There is nothing open on the right leg 09/22/2018; the patient has excellent edema  control in his legs bilaterally. He is using his external compression pumps twice a day. He has no open area on the right leg and only the areas in the left foot dorsally second and third toe area on the left side. He does not have any signs of active cellulitis. 10/06/2018; the patient has good edema control bilaterally. He has no open wound on the right leg. There is a blister in the posterior aspect of his left calf that we had to deal with today. He is using his compression pumps twice a day. There is no signs of active cellulitis. We have been using silver alginate to the wound areas. He still has vulnerable areas on the base of his left first second toes dorsally He has a his extremities stockings and we are going to transition him today into the stocking on the right leg. He is cautioned that he will need to continue to use the compression pumps twice a day. If he notices uncontrolled edema in the right leg he may need to go to 3 times a day. 10/13/2018; the patient came in for a nurse check on Friday he has a large flaccid blister on the right medial calf just below the knee. We unroofed this. He has this and a new area underneath the posterior mid calf which was undoubtedly a blister as well. He also has several small areas on the right which is the area we put his extremities stocking on. 10/19/2018; the patient went to see infectious disease this morning I am not sure if that was a routine follow-up in any case the doxycycline I had given him was discontinued and started on linezolid. He has not started this. It is easy to look at his left calf and the inflammation and think this is cellulitis however he is very tender in the tissue just below the popliteal fossa and I have no doubt that there is infection going on here. He states the problem he is having is that with the compression pumps the edema goes down and then starts walking the wrap falls down. We will see if we can adhere this. He  has 1 or 2 minuscule open areas on the right still areas that are weeping on the posterior left calf, the base of his left second and third toes 10/26/18; back today in clinic with quite of skin breakdown in his left anterior leg. This may have been infection the area below the popliteal fossa seems a lot better however tremendous epithelial loss on the left anterior mid tibia area over quite inexpensive tissue. He has 2 blisters on the right side but no other open wound here. 10/29/2018; came in urgently to see [place] today and we worked him in for review. He states that the 4 layer compression on the right leg caused pain he had to cut it down to roughly his mid calf this caused swelling above the wrap and he has blisters and skin breakdown today. As a result of the pain he has not been using his pumps. Both legs are a lot more edematous and there  is a lot of weeping fluid. 11/02/18; arrives in clinic with continued difficulties in the right leg> left. Leg is swollen and painful. multiple skin blisters and new open areas especially laterally. He has not been using his pumps on the right leg. 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 He is not systemically unwell. 11/09/2018; the patient claims he is being compliant with his pumps. He is finished the doxycycline I gave him last week. Culture I did of the wound on the right lateral leg showed a few very resistant methicillin staph aureus. This was resistant to doxycycline. Nevertheless he states the pain in the leg is a lot better which makes me wonder if the cultured organism was not really what was causing the problem nevertheless this is a very dangerous organism to be culturing out of any wound. His right leg is still a lot larger than the left. He is using an Radio broadcast assistant on this area, he blames a 4-layer compression for causing the original skin breakdown which I doubt is true however I cannot talk him out  of it. We have been using silver alginate to all of these areas which were initially blisters 11/16/2018; patient is being compliant with his external compression pumps at twice a day. Miraculously he arrives in clinic today with absolutely no open wounds. He has better edema control on the left where he has been using 4 layer compression versus wound of wounds on the right and I pointed this out to him. There is no inflammation in the skin in his lower legs which is also somewhat unusual for him. There is no open wounds on the dorsal left foot. He has extremitease stockings at home and I have asked him to bring these in next week. 11/25/18 patient's lower extremity on examination today on the left appears for the most part to be wound free. He does have an open wound on the lateral aspect of the right lower extremity but this is minimal compared to what I've seen in past. He does request that we go ahead and wrap the left leg as well even though there's nothing open just so hopefully it will not reopen in short order. 1/28; patient has superficial open wounds on the right lateral calf left anterior calf and left posterior calf. His edema control is adequate. 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 We have been using silver alginate as the primary dressing. He claims compliance with his compression pumps 2/4; patient has superficial open wounds on numerous areas of his left calf and again one on the left dorsal foot. The areas on the right lateral calf have healed. The cellulitis that I gave him doxycycline for last week is also resolved this was mostly on the left anterior calf just below the tibial tuberosity. His edema looks fairly well-controlled. He tells me he went to see his primary doctor today and had blood work ordered 2/11; once again he has several open areas on the left calf left tibial area. Most of  these are small and appear to have healthy granulation. He does not have anything open on the right. The edema and control in his thighs is pretty good which is usually a good indication he has been using his pumps as requested. 2/18; he continues to have several small areas on the left calf and left tibial area. Most of  these are small healthy granulation. We put him in his stocking on the right leg last week and he arrives with a superficial open area over the right upper tibia and a fairly large area on the right lateral tibia in similar condition. His edema control actually does not look too bad, he claims to be using his compression pumps twice a day 2/25. Continued small areas on the left calf and left tibial area. New areas especially on the right are identified just below the tibial tuberosity and on the right upper tibia itself. There are also areas of weeping edema fluid even without an obvious wound. He does not have a considerable degree of lymphedema but clearly there is more edema here than his skin can handle. He states he is using the pumps twice a day. We have an Unna boot on the right and 4 layer compression on the left. 3/3; he continues to have an area on the right lateral calf and right posterior calf just below the popliteal fossa. There is a fair amount of tenderness around the wound on the popliteal fossa but I did not see any evidence of cellulitis, could just be that the wrap came down and rubbed in this area. He does not have an open area on the left leg however there is an area on the left dorsal foot at the base of the third toe We have been using silver alginate to all wound areas 3/10; he did not have an open area on his left leg last time he was here a week ago. T oday he arrives with a horizontal wound just below the tibial tuberosity and an area on the left lateral calf. He has intense erythema and tenderness in this area. The area is on the right lateral calf and  right posterior calf better than last week. We have been using silver alginate as usual 3/18 - Patient returns with 3 small open areas on left calf, and 1 small open area on right calf, the skin looks ok with no significant erythema, he continues the UNA boot on right and 4 layer compression on left. The right lateral calf wound is closed , the right posterior is small area. we will continue silver alginate to the areas. 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 He is been tolerating the dressing changes without complication. Fortunately she has made excellent improvement in regard to the overall status of his wounds. Unfortunately every time we cease wrapping him he ends up reopening in causing more significant issues at that point. Again I'm unsure of the best direction to take although I think the lymphedema clinic may be appropriate for him. 02/03/19 on evaluation today patient appears to be doing well in regard to the wounds that we saw him for last week unfortunately he has a new area on the proximal portion of his right medial/posterior lower extremity where the wrap somewhat slowed down and caused swelling and a blister to rub and open. Unfortunately this is the only opening that he has on either leg at this point. 02/17/19 on evaluation today patient's bilateral lower extremities appear to be doing well. He still completely healed in regard to the left lower extremity. In regard to the right lower extremity the area where the wrap and slid down and caused the  blister still seems to be slightly open although this is dramatically better than during the last evaluation two weeks ago. I'm very pleased with the way this stands overall. 03/03/19 on evaluation today patient appears to be doing well in regard  to his right lower extremity in general although he did have a new blister open this does not appear to be showing any evidence of active infection at this time. Fortunately there's No fevers, chills, nausea, or vomiting noted at this time. Overall I feel like he is making good progress it does feel like that the right leg will we perform the [employer] seems to do with a bit better than three layer wrap on the left which slid down on him. We may switch to doing bilateral in the book wraps. 5/4; I have not seen Mr. Hotard in quite some time. According to our case manager he did not have an open wound on his left leg last week. He had 1 remaining wound on the right posterior medial calf. He arrives today with multiple openings on the left leg probably were blisters and/or wrap injuries from Unna boots. I do not think the Unna boot's will provide adequate compression on the left. I am also not clear about the frequency he is using the compression pumps. 03/17/19 on evaluation today patient appears to be doing excellent in regard to his lower extremities compared to last week's evaluation apparently. He had gotten significantly worse last week which is unfortunate. The [employer] wrap on the left did not seem to do very well for him at all and in fact it didn't control his swelling significantly enough he had an additional outbreak. Subsequently we go back to the four layer compression wrap on the left. This is good news. At least in that he is doing better and the wound seem to be killing him. He still has not heard anything from the lymphedema clinic. 03/24/19 on evaluation today patient actually appears to be doing much better in regard to his bilateral lower Trinity as compared to last week when I saw him. Fortunately there's no signs of active infection at this time. He has been tolerating the dressing changes without complication. Overall I'm extremely pleased with the progress and appearance in  general. 04/07/19 on evaluation today patient appears to be doing well in regard to his bilateral lower extremities. His swelling is significantly down from where it was previous. With that being said he does have a couple blisters still open at this point but fortunately nothing that seems to be too severe and again the majority of the larger openings has healed at this time. 04/14/19 on evaluation today patient actually appears to be doing quite well in regard to his bilateral lower extremities in fact I'm not even sure there's anything significantly open at this time at any site. Nonetheless he did have some trouble with these wraps where they are somewhat irritating him secondary to the fact that he has noted that the graph wasn't too close down to the end of this foot in a little bit short as well up to his knee. Otherwise things seem to be doing quite well. 04/21/19 upon evaluation today patient's wound bed actually showed evidence of being completely healed in regard to both lower extremities which is excellent news. There does not appear to be any signs of active infection which is also good news. I'm very pleased in this regard. No fevers, chills, nausea, or vomiting noted at this time.  04/28/19 on evaluation today patient appears to be doing a little bit worse in regard to both lower extremities on the left mainly due to the fact that when he went infection disease the wrap was not wrapped quite high enough he developed a blister above this. On the right he is a small open area of nothing too significant but again this is continuing to give him some trouble he has been were in the Velcro compression that he has at home. 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 He's been tolerating the dressing changes without complication. Fortunately there's no signs of active infection at this time. No fevers, chills, nausea, or vomiting noted at  this time. We have been trying to get an appointment with her lymphedema clinic in [facility] but unfortunately nobody can get them on phone with not been able to even fax information over the patient likewise is not been able to get in touch with them. Overall I'm not sure exactly what's going on here with to reach out again today. 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 Still having a lot of drainage unfortunately. He tells me especially in the left but even on the right. There's no signs of active infection which is good news we've been using so ratcheted up to this point. 05/19/19 on evaluation today patient actually appears to be doing quite well with regard to his left lower extremity which is great news. Fortunately in regard to the right lower extremity has an issues with his wrap and he subsequently did remove this from what I'm understanding. Nonetheless long story short is what he had rewrapped once he removed it subsequently had maggots underneath this wrap whenever he came in for evaluation today. With that being said they were obviously completely cleaned away by the nursing staff. The visit today which is excellent news. However he does appear to potentially have some infection around the right ankle region where the maggots were located as well. He will likely require anabiotic therapy today. 05/26/19 on evaluation today patient actually appears to be doing much better in regard to his bilateral lower extremities. I feel like the infection is under much better control. With that being said there were maggots noted when the wrap was removed yet again today. Again this could have potentially been left over from previous although at this time there does not appear to be any signs of significant drainage there was obviously on the wrap some drainage as well this contracted gnats or  otherwise. Either way I do not see anything that appears to be doing worse in my pinion and in fact I think his drainage has slowed down quite significantly likely mainly due to the fact to his infection being under better control. 06/02/2019 on evaluation today patient actually appears to be doing well with regard to his bilateral lower extremities there is no signs of active infection at this time which is great news. With that being said he does have several open areas more so on the right than the left but nonetheless these are all significantly better than previously noted. 06/09/2019 on evaluation today patient actually appears to be doing well. His wrap stayed up and he did not cause any problems he had more drainage on the right compared to the left but overall I do not see any major issues  at this time which is great news. 06/16/2019 on evaluation today patient appears to be doing excellent with regard to his lower extremities the only area that is open is a new blister that can have opened as of today on the medial ankle on the left. Other than this he really seems to be doing great I see no major issues at this point. 06/23/2019 on evaluation today patient appears to be doing quite well with regard to his bilateral lower extremities. In fact he actually appears to be almost completely healed there is a small area of weeping noted of the right lower extremity just above the ankle. Nonetheless fortunately there is no signs of active infection at this time which is good news. No fevers, chills, nausea, vomiting, or diarrhea. 8/24; the patient arrived for a nurse visit today but complained of very significant pain in the left leg and therefore I was asked to look at this. Noted that he did not have an open area on the left leg last week nevertheless this was wrapped. The patient states that he is not been able to put his compression pumps on the left leg because of the discomfort. He has not been  systemically unwell 06/30/2019 on evaluation today patient unfortunately despite being excellent last week is doing much worse with regard to his left lower extremity today. In fact he had to come in for a nurse on Monday where his left leg had to be rewrapped due to excessive weeping Dr. Leanord Hawking placed him on doxycycline at that point. Fortunately there is no signs of active infection Systemically at this time which is good news. 07/07/2019 in regard to the patient's wounds today he actually seems to be doing well with his right lower extremity there really is nothing open or draining at this point this is great news. Unfortunately the left lower extremity is given him additional trouble at this time. There does not appear to be any signs of active infection nonetheless he does have a lot of edema and swelling noted at this point as well as blistering all of which has led to a much more poor appearing leg at this time compared to where it was 2 weeks ago when it was almost completely healed. Obviously this is a little discouraging for the patient. He is try to contact the lymphedema clinic in [place] he has not been able to get through to them. 07/14/2019 on evaluation today patient actually appears to be doing slightly better with regard to his left lower extremity ulcers. Overall I do feel like at least at the top of the wrap that we have been placing this area has healed quite nicely and looks much better. The remainder of the leg is showing signs of improvement. Unfortunately in the thigh area he still has an open region on the left and again on the right he has been utilizing just a Band-Aid on an area that also opened on the thigh. Again this is an area that were not able to wrap although we did do an Ace wrap to provide some compression that something that obviously is a little less effective than the compression wraps we have been using on the lower portion of the leg. He does have an  appointment with the lymphedema clinic in [name] on Friday. 07/21/2019 on evaluation today patient appears to be doing better with regard to his lower extremity ulcers. He has been tolerating the dressing changes without complication. Fortunately there is no signs of  active infection at this time. No fevers, chills, nausea, vomiting, or diarrhea. I did receive the paperwork from the physical therapist at the lymphedema clinic in [place]. Subsequently I signed off on that this morning and sent that back to him for further progression with the treatment plan. 07/28/2019 on evaluation today patient appears to be doing very well with regard to his right lower extremity where I do not see any open wounds at this point. Fortunately he is feeling great as far as that is concerned as well. In regard to the left lower extremity he has been having issues with still several areas of weeping and edema although the upper leg is doing better his lower leg still I think is going require the compression wrap at this time. No fevers, chills, nausea, vomiting, or diarrhea. 08/04/2019 on evaluation today patient unfortunately is having new wounds on the right lower extremity. Again we have been using Unna boot wrap on that side. We switched him to using his juxta lite wrap at home. With that being said he tells me he has been using it although his legs extremely swollen and to be honest really does not appear that he has been. I cannot know that for sure however. Nonetheless he has multiple new wounds on the right lower extremity at this time. Obviously we will have to see about getting this rewrapped for him today. 08/11/2019 on evaluation today patient appears to be doing fairly well with regard to his wounds. He has been tolerating the dressing changes including the compression wraps without complication. He still has a lot of edema in his upper thigh regions bilaterally he is supposed to be seeing the  lymphedema clinic on the 15th of this month once his wraps arrive for the upper part of his legs. 08/18/2019 on evaluation today patient appears to be doing well with regard to his bilateral lower extremities at this point. He has been tolerating the dressing changes without complication. Fortunately there is no signs of active infection which is also good news. He does have a couple weeping areas on the first and second toe of the right foot he also has just a small area on the left foot upper leg and a small area on the left lower leg but overall he is doing quite well in my opinion. He is supposed to be getting his wraps shortly in fact tomorrow and then subsequently is seeing the lymphedema clinic next Wednesday on the 21st. Of note he is also leaving on the 25th to go on vacation for a [place] to the beach. For that reason and since there is some uncertainty about what there can be doing at lymphedema clinic next Wednesday I am get a make an appointment for next Friday here for [place] to see what we need to do for him prior to him leaving for vacation. 10/23; patient arrives in considerable pain predominantly in the upper posterior calf just distal to the popliteal fossa also in the wound anteriorly above the major wound. This is probably cellulitis and he has had this recurrently in the past. He has no open wound on the right side and he has had an Radio broadcast assistant in that area. Finally I note that he has an area on the left posterior calf which by enlarge is mostly epithelialized. This protrudes beyond the borders of the surrounding skin in the setting of dry scaly skin and lymphedema. The patient is leaving for [facility] on Sunday. Per his longstanding pattern,  he will not take his compression pumps with him predominantly out of fear that they will be stolen. He therefore asked that we put a Unna boot back on the right leg. He will also contact the wound care center in [name] to see if they can  change his dressing in the mid week. 11/3; patient returned from his vacation to [name]. He was seen on 1 occasion at their wound care center. They did a 2 layer compression [name] as they did not have our 4-layer wrap. I am not completely certain what they put on the wounds. They did not change the Unna boot on the right. The patient is also seeing a lymphedema specialist physical therapist in [place]. It appears that he has some compression sleeve for his thighs which indeed look quite a bit better than I am used to seeing. He pumps over these with his external compression pumps. 11/10; the patient has a new wound on the right medial thigh otherwise there is no open areas on the right. He has an area on the left leg posteriorly anteriorly and medially and an area over the left second toe. We have been using silver alginate. He thinks the injury on his thigh is secondary to friction from the compression sleeve he has. 11/17; the patient has a new wound on the right medial thigh last week. He thinks this is because he did not have a underlying stocking for his thigh juxta lite apparatus. He now has this. The area is fairly large and somewhat angry but I do not think he has underlying cellulitis. He has a intact blister on the right anterior tibial area. Small wound on the right great toe dorsally Small area on the medial left calf. 11/30; the patient does not have any open areas on his right leg and we did not take his juxta lite stocking off. However he states that on Friday his compression wrap fell down lodging around his upper mid calf area. As usual this creates a lot of problems for him. He called urgently today to be seen for a nurse visit however the nurse visit turned into a provider visit because of extreme erythema and pain in the left anterior tibia extending laterally and posteriorly. The area that is problematic is extensive 10/06/2019 upon evaluation today patient actually  appears to be doing poorly in regard to his left lower extremity. He Dr. Leanord Hawking did place him on doxycycline this past Monday apparently due to the fact that he was doing much worse in regard to this left leg. Fortunately the doxycycline does seem to be helping. Unfortunately we are still having a very difficult time getting his edema under any type of control in order to anticipate discharge at some point. The only way were really able to control his lymphedema really is with compression wraps and that has only even seemingly temporary. He has been seeing a lymphedema clinic they are trying to help in this regard but still this has been somewhat frustrating in general for the patient. 10/13/19 on evaluation today patient appears to be doing excellent with regard to his right lower extremity as far as the wounds are concerned. His swelling is still quite extensive unfortunately. He is still having a lot of drainage from the thigh areas bilaterally which is unfortunate. He's been going to lymphedema clinic but again he still really does not have this edema under control as far as his lower extremities are concern. With regard to his left  lower extremity this seems to be improving and I do believe the doxycycline has been of benefit for him. He is about to complete the doxycycline. 10/20/2019 on evaluation today patient appears to be doing poorly in regard to his bilateral lower extremities. More in the right thigh he has a lot of irritation at this site unfortunately. In regard to the left lower extremity the wrap was not quite as high it appears and does seem to have caused him some trouble as well. Fortunately there is no evidence of systemic infection though he does have some blue-green drainage which has me concerned for the possibility of Pseudomonas. He tells me he is previously taking Cipro without complications and he really does not care for Levaquin however due to some of the side effects he  has. He is not allergic to any medications specifically antibiotics that were aware of. 10/27/2019 on evaluation today patient actually does appear to be for the most part doing better when compared to last week's evaluation. With that being said he still has multiple open wounds over the bilateral lower extremities. He actually forgot to start taking the Cipro and states that he still has the whole bottle. He does have several new blisters on left lower extremity today I think I would recommend he go ahead and take the Cipro based on what I am seeing at this point. 12/30-Patient comes at 1 week visit, 4 layer compression wraps on the left and Unna boot on the right, primary dressing Xtrasorb and silver alginate. Patient is taking his Cipro and has a few more days left probably 5-6, and the legs are doing better. He states he is using his compressions devices which I believe he has 11/10/2019 on evaluation today patient actually appears to be much better than last time I saw him 2 weeks ago. His wounds are significantly improved and overall I am very pleased in this regard. Fortunately there is no signs of active infection at this time. He is just a couple of days away from completing Cipro. Overall his edema is much better he has been using his lymphedema pumps which I think is also helping at this point. 11/17/2019 on evaluation today patient appears to be doing excellent in regard to his wounds in general. His legs are swollen but not nearly as much as they have been in the past. Fortunately he is tolerating the compression wraps without complication. No fevers, chills, nausea, vomiting, or diarrhea. He does have some erythema however in the distal portion of his right lower extremity specifically around the forefoot and toes there is a little bit of warmth here as well. 11/24/2019 on evaluation today patient appears to be doing well with regard to his right lower extremity I really do not see any open  wounds at this point. His left lower extremity does have several open areas and his right medial thigh also is open. Other than this however overall the patient seems to be making good progress and I am very pleased at this point. 12/01/2019 on evaluation today patient appears to be doing poorly at this point in regard to his left lower extremity has several new blisters despite the fact that we have him in compression wraps. In fact he had a 4-layer compression wrap, his upper thigh wrapped from lymphedema clinic, and a juxta light over top of the 4 layer compression wrap the lymphedema clinic applied and despite all this he still develop blisters underneath. Obviously this does have me concerned  about the fact that unfortunately despite what we are doing to try to get wounds healed he continues to have new areas arise I do not think he is ever good to be at the point where he can realistically just use wraps at home to keep things under control. Typically when we heal him it takes about 1-2 days before he is back in the clinic with severe breakdown and blistering of his lower extremities bilaterally. This is happened numerous times in the past. Unfortunately I think that we may need some help as far as overall fluid overload to kind of limit what we are seeing and get things under better control. 12/08/2019 on evaluation today patient presents for follow-up concerning his ongoing bilateral lower extremity edema. Unfortunately he is still having quite a bit of swelling the compression wraps are controlling this to some degree but he did see Dr. [name] his cardiologist I do have that available for review today as far as the appointment was concerned that was on 12/06/2019. Obviously that she has been 2 days ago. The patient states that he is only been taking the Lasix 80 mg 1 time a day he had told me previously he was taking this twice a day. Nonetheless Dr. [name] recommended this be up to 80 mg 2 times a  day for the patient as he did appear to be fluid overloaded. With that being said the patient states he did this yesterday and he was unable to go anywhere or do anything due to the fact that he was constantly having to urinate. Nonetheless I think that this is still good to be something that is important for him as far as trying to get his edema under control at all things that he is going to be able to just expect his wounds to get under control and things to be better without going through at least a period of time where he is trying to stabilize his fluid management in general and I think increasing the Lasix is likely the first step here. It was also mentioned the possibility that the patient may require metolazone. With that being said he wanted to have the patient take Lasix twice a day first and then reevaluating 2 months to see where things stand. 12/15/2019 upon evaluation today patient appears to be doing regard to his legs although his toes are showing some signs of weeping especially on the left at this point to some degree on the right. There does not appear to be any signs of active infection and overall I do feel like the compression wraps are doing well for him but he has not been able to take the Lasix at home and the increased dose that Dr. [name] recommended. He tells me that just not go to be feasible for him. Nonetheless I think in this case he should probably send a message to Dr. [name] in order to discuss options from the standpoint of possible admission to get the fluid off or otherwise going forward. 12/22/2019 upon evaluation today patient appears to be doing fairly well with regard to his lower extremities at this point. In fact he would be doing excellent if it was not for the fact that his right anterior thigh apparently had an allergic reaction to adhesive tape that he used. The wound itself that we have been monitoring actually appears to be healed. There is a lot of  irritation at this point. 12/29/2019 upon evaluation today patient appears to be doing well  in regard to his lower extremities. His left medial thigh is open and somewhat draining today but this is the only region that is open the right has done much better with the treatment utilizing the steroid cream that I prescribed for him last week. Overall I am pleased in that regard. Fortunately there is no signs of active infection at this time. No fevers, chills, nausea, vomiting, or diarrhea. 01/05/2020 upon evaluation today patient appears to be doing more poorly in regard to his right lower extremity at this point upon evaluation today. Unfortunately he continues to have issues in this regard and I think the biggest issue is controlling his edema. This obviously is not very well controlled at this point is been recommended that he use the Lasix twice a day but he has not been able to do that. Unfortunately I think this is leading to an issue where honestly he is not really able to effectively control his edema and therefore the wounds really are not doing significantly better. I do not think that he is going to be able to keep things under good control unless he is able to control his edema much better. I discussed this again in great detail with him today. 01/12/2020 good news is patient actually appears to be doing quite well today at this point. He does have an appointment with lymphedema clinic tomorrow. His legs appear healed and the toe on the left is almost completely healed. In general I am very pleased with how things stand at this point. 01/19/2020 upon evaluation today patient appears to actually be doing well in regard to his lower extremities there is nothing open at this point. Fortunately he has done extremely well more recently. Has been seeing lymphedema clinic as well. With that being said he has Velcro wraps for his lower legs as well as his upper legs. The only wound really is on his toe  which is the right great toe and this is barely anything even there. With all that being said I think it is good to be appropriate today to go ahead and switch him over to the Velcro compression wraps. 01/26/2020 upon evaluation today patient appears to be doing worse with regard to his lower extremities after last week switch him to Velcro compression wraps. Unfortunately he lasted less than 24 hours he did not have the sock portion of his Velcro wrap on the left leg and subsequently developed a blister underneath the Velcro portion. Obviously this is not good and not what we were looking for at this point. He states the lymphedema clinic did tell him to wear the wrap for 23 hours and take him off for 1 I am okay with that plan but again right now we got a get things back under control again he may have some cellulitis noted as well. 02/02/2020 upon evaluation today patient unfortunately appears to have several areas of blistering on his bilateral lower extremities today mainly on the feet. His legs do seem to be doing somewhat better which is good news. Fortunately there is no evidence of active infection at this time. No fevers, chills, nausea, vomiting, or diarrhea. 02/16/2020 upon evaluation today patient appears to be doing well at this time with regard to his legs. He has a couple weeping areas on his toes but for the most part everything is doing better and does appear to be sealed up on his legs which is excellent news. We can continue with wrapping him at this point  as he had every time we discontinue the wraps he just breaks out with new wounds. There is really no point in is going forward with this at this point. 03/08/2020 upon evaluation today patient actually appears to be doing quite well with regard to his lower extremity ulcers. He has just a very superficial and really almost nonexistent blister on the left lower extremity he has in general done very well with the compression wraps. With  that being said I do not see any signs of infection at this time which is good news. 03/29/2020 upon evaluation today patient appears to be doing well with regard to his wounds currently except for where he had several new areas that opened up due to some of the wrap slipping and causing him trouble. He states he did not realize they had slipped. Nonetheless he has a 1 area on the right and 3 new areas on the left. Fortunately there is no signs of active infection at this time which is great news. 04/05/2020 upon evaluation today patient actually appears to be doing quite well in general in regard to his legs currently. Fortunately there is no signs of active infection at this time. No fevers, chills, nausea, vomiting, or diarrhea. He tells me next week that he will actually be seen in the lymphedema clinic on Thursday at 10 AM I see him on Wednesday next week. 04/12/2020 upon evaluation today patient appears to be doing very well with regard to his lower extremities bilaterally. In fact he does not appear to have any open wounds at this point which is good news. Fortunately there is no signs of active infection at this time. No fevers, chills, nausea, vomiting, or diarrhea. 04/19/2020 upon evaluation today patient appears to be doing well with regard to his wounds currently on the bilateral lower extremities. There does not appear to be any signs of active infection at this time. Fortunately there is no evidence of systemic infection and overall very pleased at this point. Nonetheless after I held him out last week he literally had blisters the next morning already which swelled up with him being right back here in the clinic. Overall I think that he is just not can be able to be discharged with his legs the way they are he is much to volume overloaded as far as fluid is concerned and that was discussed with him today of also discussed this but should try the clinic nurse manager as well as Dr.  Leanord Hawkingobson. 04/26/2020 upon evaluation today patient appears to be doing better with regard to his wounds currently. He is making some progress and overall swelling is under good control with the compression wraps. Fortunately there is no evidence of active infection at this time. 05/10/2020 on evaluation today patient appears to be doing overall well in regard to his lower extremities bilaterally. He is Tolerating the compression wraps without complication and with what we are seeing currently I feel like that he is making excellent progress. There is no signs of active infection at this time. 05/24/2020 upon evaluation today patient appears to be doing well in regard to his legs. The swelling is actually quite a bit down compared to where it has been in the past. Fortunately there is no sign of active infection at this time which is also good news. With that being said he does have several wounds on his toes that have opened up at this point. 05/31/2020 upon evaluation today patient appears to be doing well  with regard to his legs bilaterally where he really has no significant fluid buildup at this point overall he seems to be doing quite well. Very pleased in this regard. With regard to his toes these also seem to be drying up which is excellent. We have continue to wrap him as every time we tried as a transition to the juxta light wraps things just do not seem to get any better. 06/07/2020 upon evaluation today patient appears to be doing well with regard to his right leg at this point. Unfortunately left leg has a lot of blistering he tells me the wrap started to slide down on him when he tried to put his other Velcro wrap over top of it to help keep things in order but nonetheless still had some issues. 06/14/2020 on evaluation today patient appears to be doing well with regard to his lower extremity ulcers and foot ulcers at this point. I feel like everything is actually showing signs of improvement which  is great news overall there is no signs of active infection at this time. No fevers, chills, nausea, vomiting, or diarrhea. 06/21/2020 on evaluation today patient actually appears to be doing okay in regard to his wounds in general. With that being said the biggest issue I see is on his right foot in particular the first and second toe seem to be doing a little worse due to the fact this is staying very wet. I think he is probably getting need to change out his dressings a couple times in between each week when we see him in regard to his toes in order to keep this drier based on the location and how this is proceeding. 06/28/2020 on evaluation today patient appears to be doing a little bit more poorly overall in regard to the appearance of the skin I am actually somewhat concerned about the possibility of him having a little bit of an infection here. We discussed the course of potentially giving him a doxycycline prescription which he is taken previously with good result. With that being said I do believe that this is potentially mild and at this point easily fixed. I just do not want anything to get any worse. 07/12/2020 upon evaluation today patient actually appears to be making some progress with regard to his legs which is great news there does not appear to be any evidence of active infection. Overall very pleased with where things stand. 07/26/2020 upon evaluation today patient appears to be doing well with regard to his leg ulcers and toe ulcers at this point. He has been tolerating the compression wraps without complication overall very pleased in this regard. 08/09/2020 upon evaluation today patient appears to be doing well with regard to his lower extremities bilaterally. Fortunately there is no signs of active infection overall I am pleased with where things stand. 08/23/2020 on evaluation today patient appears to be doing well with regard to his wound. He has been tolerating the dressing  changes without complication. Fortunately there is no signs of active infection at this time. Overall his legs seem to be doing quite well which is great news and I am very pleased in that regard. No fevers, chills, nausea, vomiting, or diarrhea. 09/13/2020 upon evaluation today patient appears to be doing okay in regard to his lower extremities. He does have a fairly large blister on the right leg which I did remove the blister tissue from today so we can get this to dry out other than that however he  seems to be doing quite well. There is no signs of active infection at this time. 09/27/2020 upon evaluation today patient appears to actually be doing some better in regard to his right leg. Fortunately signs of active infection at this time which is great news. No fevers, chills, nausea, vomiting, or diarrhea. 10/04/2020 upon evaluation today patient actually appears to be showing signs of improvement which is great news with regard to his leg ulcers. Fortunately there is no signs of active infection which is great news he is still taking the antibiotics currently. No fevers, chills, nausea, vomiting, or diarrhea. 10/18/2020 on evaluation today patient appears to be doing well with regard to his legs currently. He has been tolerating the dressing changes including the wraps without complication. Fortunately there is no signs of active infection at this time. No fevers, chills, nausea, vomiting, or diarrhea. 10/25/2020 upon evaluation today patient appears to be doing decently well in regard to his wounds currently. He has been tolerating the dressing changes without complication. Overall I feel like he is making good progress albeit slow. Again this is something we can have to continue to wrap for some time to come most likely. 11/08/2020 upon evaluation today patient appears to be doing well with regard to his wounds currently. He has been tolerating the dressing changes without complication is not  currently on any antibiotics and he does not appear to show any signs of infection. He does continue to have a lot of drainage on the right leg not too severe but nonetheless this is very scattered. On the left leg this is looking to be much improved overall. 11/15/2020 upon evaluation today patient appears to be doing better with regard to his legs bilaterally. Especially the right leg which was much more significant last week. There does not appear to be any signs of active infection which is great news. No fevers, chills, nausea, vomiting, or diarrhea. 11/23/2019 upon evaluation today patient appears to be doing poorly still in regard to his lower extremities bilaterally. Unfortunately his right leg in particular appears to be doing much more poorly there is no signs really of infection this is not warm to touch but he does have a lot of drainage and weeping unfortunately. With that reason I do believe that we may need to initiate some treatment here to try to help calm down some of the swelling of the right leg. I think switching to a 4-layer compression wrap would be beneficial here. The patient is in agreement with giving this a try. 11/29/2020 upon evaluation today patient appears to be doing well currently in regard to his leg ulcers. I feel like the right leg is doing better he still has a lot of drainage but we do see some improvement here. The 4-layer compression wrap I think was helpful. 12/06/2020 upon evaluation today patient appears to be doing well with regard to his legs. In fact they seem to be doing about the best I have seen up to this point. Fortunately there is no signs of active infection at this time. No fevers, chills, nausea, vomiting, or diarrhea. 12/20/2020 upon evaluation today patient appears to be doing well at this time in regard to his legs. He is not having any significant draining which is great news. Fortunately there is no signs of active infection at this time. No fevers,  chills, nausea, vomiting, or diarrhea. 01/17/2021 upon evaluation today [name] actually appears to be doing excellent in regard to his legs. He has a  few areas again that come and go as far as his toes are concerned but overall this is doing quite well. 01/31/2021 upon evaluation today patient appears to be doing well with regard to his legs. Fortunately there does not appear to be any signs of active infection which is great news. Overall he is still having significant edema despite the compression wraps basically the 4-layer compression wrap to just keep things under control there is really not much room for play. 4/13: Mr. Engram is a longstanding patient in our clinic and benefits greatly from weekly compression wraps. Today he has no complaints. He has been tolerating the wraps well. He states he is using the lymphedema pumps at home. 5/4; patient presents for follow-up of his chronic lymphedema/venous insufficiency ulcers. He comes weekly for compression wraps. He has no complaints today. He was unable to tolerate the Coflex 2 layer Last week so we will do the four press 4-layer compression. He has been using his lymphedema pumps daily. 5/18; patient presents for 2-week follow-up. He has no complaints or issues today. He has developed a new wound to the right foot on his fourth toe. He overall feels well and denies signs of infection. 6/1; patient presents for 2-week follow-up. He has no complaints or issues today. He denies signs of infection. 04/18/2021 upon evaluation today patient appears to be doing well with regard to his legs bilaterally. Family open wound is actually on the toe of his left foot everything else is completely closed which is great news. In general I am extremely pleased with where things stand at this point. The patient is also happy that things are doing so well. 05/02/2021 upon evaluation today patient's legs actually appear to be doing quite well today. Fortunately there  does not appear to be any signs of active infection which is great and overall I am extremely pleased with where he stands today. The patient does not appear to have any evidence of active infection at this time which is also great news. 05/09/2021 upon evaluation today patient appears to be doing a little bit more poorly in regard to his legs. Unfortunately he is having issues with some breakdown and a blood blister on the left leg this is due to I believe honestly to how it was wrapped last week. Fortunately there does not appear to be any signs of infection but nonetheless this is still a concern to be honest. No fevers, chills, nausea, vomiting, or diarrhea. 05/16/2021 upon evaluation today patient appears to be doing significantly better as compared to last week. I am very pleased with where things stand today. There does not appear to be any signs of infection which is great news and overall very pleased with where we stand. No fevers, chills, nausea, vomiting, or diarrhea. 05/30/2021 upon evaluation today patient appears to be doing well with regard to his legs. He has been tolerating the dressing changes without complication. Fortunately there does not appear to be any signs of active infection which is great news and overall I am extremely pleased with where things stand today. No fevers, chills, nausea, vomiting, or diarrhea. 06/20/2021 upon evaluation today patient actually appears to be making good progress today and very pleased with what we are seeing. I think his legs are really maintaining. As long as we continue wrapping he seems to be doing excellent in my opinion. Fortunately there is no signs of active infection at this time. No fevers, chills, nausea, vomiting, or diarrhea. 07/11/2021 upon evaluation  today patient actually appears to be making excellent progress at this time. Fortunately there does not appear to be any evidence of active infection which is great news and overall I am  extremely pleased with where things stand today. No fevers, chills, nausea, vomiting, or diarrhea. 07/25/2021 upon evaluation today patient appears to be doing well currently in regard to his lower extremities. He has been making good progress here and I do not see anything that is actually open significantly today this is great news. No fevers, chills, nausea, vomiting, or diarrhea. Electronic Signature(s) Signed: 07/25/2021 12:37:07 PM By: Lenda Kelp PA-C Entered By: Lenda Kelp on 07/25/2021 12:37:06 -------------------------------------------------------------------------------- Physical Exam Details Patient Name: Date of Service: [name], [name] 07/25/2021 10:30 A M Medical Record Number: [id number] Patient Account Number: [id number] Date of Birth/Sex: Treating RN: 19-Nov-1950 (70 y.o. [name] Primary Care Provider: Nicoletta Ba Other Clinician: Referring Provider: Treating Provider/Extender: Adele Dan in Treatment: 287 Constitutional Well-nourished and well-hydrated in no acute distress. Respiratory normal breathing without difficulty. Psychiatric this patient is able to make decisions and demonstrates good insight into disease process. Alert and Oriented x 3. pleasant and cooperative. Notes Upon inspection patient's wound bed actually showed signs of complete epithelization in regard to the toe everything else is doing well his skin actually appears to be intact and doing quite nicely today all things considered. Electronic Signature(s) Signed: 07/25/2021 12:37:23 PM By: Lenda Kelp PA-C Entered By: Lenda Kelp on 07/25/2021 12:37:22 -------------------------------------------------------------------------------- Physician Orders Details Patient Name: Date of Service: [name], [name] 07/25/2021 10:30 A M Medical Record Number: [id number] Patient Account Number: [id number] Date of Birth/Sex: Treating RN: 17-Nov-1950 (70 y.o.  [name] Primary Care Provider: Nicoletta Ba Other Clinician: Referring Provider: Treating Provider/Extender: Adele Dan in Treatment: [phone] Verbal / Phone Orders: No Diagnosis Coding ICD-10 Coding Code Description L97.521 Non-pressure chronic ulcer of other 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 of skin L97.829 Non-pressure chronic ulcer of other part of left lower leg with unspecified severity L97.519 Non-pressure chronic ulcer of other part of right foot with unspecified severity I87.333 Chronic venous hypertension (idiopathic) with ulcer and inflammation of bilateral lower extremity I89.0 Lymphedema, not elsewhere classified E11.622 Type 2 diabetes mellitus with other skin ulcer E11.40 Type 2 diabetes mellitus with diabetic neuropathy, unspecified Follow-up Appointments Return appointment in 3 weeks. - with Leonard Schwartz Nurse Visit: - one week 9/28 and 10/5 Bathing/ Shower/ Hygiene May shower with protection but do not get wound dressing(s) wet. Edema Control - Lymphedema / SCD / Other Bilateral Lower Extremities Lymphedema Pumps. Use Lymphedema pumps on leg(s) 2-3 times a day for 45-60 minutes. If wearing any wraps or hose, do not remove them. Continue exercising as instructed. Elevate legs to the level of the heart or above for 30 minutes daily and/or when sitting, a frequency of: - throughout the day Avoid standing for long periods of time. Exercise regularly Non Wound Condition Bilateral Lower Extremities Other Non Wound Condition Orders/Instructions: - lotion to both legs, 4 layer compression wraps both legs, silver alginate to any weeping areas Electronic Signature(s) Signed: 07/25/2021 5:43:52 PM By: Zenaida Deed RN, BSN Signed: 07/26/2021 2:21:00 PM By: Lenda Kelp PA-C Entered By: Zenaida Deed on 07/25/2021 11:51:07 -------------------------------------------------------------------------------- Problem List Details Patient  Name: Date of Service: [name], [name] 07/25/2021 10:30 A M Medical Record Number: [id number] Patient Account Number: [id number] Date of Birth/Sex: Treating RN: 1951/01/07 (70 y.o.  [name] Primary Care Provider: Nicoletta Ba Other Clinician: Referring Provider: Treating Provider/Extender: Adele Dan in Treatment: 287 Active Problems ICD-10 Encounter Code Description Active Date MDM Diagnosis L97.521 Non-pressure chronic ulcer of other 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 of 04/27/2018 No Yes skin L97.829 Non-pressure chronic ulcer of other part of left lower leg with unspecified 03/21/2021 No Yes severity L97.519 Non-pressure chronic ulcer of other part of right foot with unspecified severity 03/21/2021 No Yes I87.333 Chronic venous hypertension (idiopathic) with ulcer and inflammation of 01/22/2016 No Yes bilateral lower extremity I89.0 Lymphedema, not elsewhere classified 01/22/2016 No Yes E11.622 Type 2 diabetes mellitus with other skin ulcer 01/22/2016 No Yes E11.40 Type 2 diabetes mellitus with diabetic neuropathy, unspecified 01/22/2016 No Yes Inactive Problems ICD-10 Code Description Active Date Inactive Date L03.115 Cellulitis of right lower limb 12/22/2017 12/22/2017 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 06/30/2018 06/30/2018 L97.511 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 of skin 06/30/2018 06/30/2018 Resolved Problems ICD-10 Code Description Active Date Resolved Date L97.211 Non-pressure chronic ulcer of right calf limited to breakdown of skin 06/30/2018 06/30/2018 L97.221 Non-pressure chronic ulcer of left calf limited to breakdown of skin 09/30/2016 09/30/2016 L03.116 Cellulitis of left lower limb 04/01/2017 04/01/2017 L97.211 Non-pressure chronic ulcer of right calf limited to breakdown of skin 06/30/2017 06/30/2017 Electronic Signature(s) Signed: 07/25/2021  10:55:18 AM By: Lenda Kelp PA-C Entered By: Lenda Kelp on 07/25/2021 10:55:17 -------------------------------------------------------------------------------- Progress Note Details Patient Name: Date of Service: [name], [name] 07/25/2021 10:30 A M Medical Record Number: [id number] Patient Account Number: [id number] Date of Birth/Sex: Treating RN: Nov 07, 1950 (70 y.o. [name] Primary Care Provider: Nicoletta Ba Other Clinician: Referring Provider: Treating Provider/Extender: Adele Dan in Treatment: 287 Subjective Chief Complaint Information obtained from Patient patient is here for evaluation of venous/lymphedema ulcers History of Present Illness (HPI) Referred by PCP for consultation. Patient has long standing history of BLE venous stasis, no prior ulcerations. At beginning of month, developed cellulitis and weeping. Received IM Rocephin followed by Keflex and resolved. Wears compression stocking, appr 6 months old. Not sure strength. No present drainage. 01/22/16 this is a patient who is a type II diabetic on insulin. 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 He tells me he religiously wears pressure stockings of uncertain strength. He was here with weeping edema about 8 months ago but did not have an open wound. Roughly a month ago he had a reopening on his bilateral legs. He is been using bandages and Neosporin. He does not complain of pain. 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 Last BUN/creatinine I have is from 11/20/15 at 13 and 1.0 respectively 01/29/16; patient arrives today having tolerated the Profore wrap. He brought in his stockings and these are 18 mmHg stockings he bought from [employer]. The compression here is likely inadequate. He does not complain of pain or excessive drainage she  has no systemic symptoms. The wound on the right looks improved as does the one on the left although one on the left is more substantial with still tissue at risk below the actual wound area on the bilateral posterior calf 02/05/16; patient arrives with poor edema control. He states that we did put a 4 layer compression on it last week. No weight  appear 5 this. 02/12/16; the area on the posterior right Has healed. The left Has a substantial wound that has necrotic surface eschar that requires a debridement with a curette. 02/16/16;the patient called or a Nurse visit secondary to increased swelling. He had been in earlier in the week with his right leg healed. He was transitioned to is on pressure stocking on the right leg with the only open wound on the left, a substantial area on the left posterior calf. Note he has a history of severe lower extremity edema, he has a history of chronic atrial fibrillation but not heart failure per my notes but I'll need to research this. He is not complaining of chest pain shortness of breath or orthopnea. The intake nurse noted blisters on the previously closed right leg 02/19/16; this is the patient's regular visit day. I see him on Friday with escalating edema new wounds on the right leg and clear signs of at least right ventricular heart failure. I increased his Lasix to 40 twice a day. He is returning currently in follow-up. States he is noticed a decrease in that the edema 02/26/16 patient's legs have much less edema. There is nothing really open on the right leg. The left leg has improved condition of the large superficial wound on the posterior left leg 03/04/16; edema control is very much better. The patient's right leg wounds have healed. On the left leg he continues to have severe venous inflammation on the posterior aspect of the left leg. There is no tenderness and I don't think any of this is cellulitis. 03/11/16; patient's right leg is married healed and he is  in his own stocking. The patient's left leg has deteriorated somewhat. There is a lot of erythema around the wound on the posterior left leg. There is also a significant rim of erythema posteriorly just above where the wrap would've ended there is a new wound in this location and a lot of tenderness. Can't rule out cellulitis in this area. 03/15/16; patient's right leg remains healed and he is in his own stocking. The patient's left leg is much better than last review. His major wound on the posterior aspect of his left Is almost fully epithelialized. He has 3 small injuries from the wraps. Really. Erythema seems a lot better on antibiotics 03/18/16; right leg remains healed and he is in his own stocking. The patient's left leg is much better. The area on the posterior aspect of the left calf is fully epithelialized. His 3 small injuries which were wrap injuries on the left are improved only one seems still open his erythema has resolved 03/25/16; patient's right leg remains healed and he is in his own stocking. There is no open area today on the left leg posterior leg is completely closed up. His wrap injuries at the superior aspect of his leg are also resolved. He looks as though he has some irritation on the dorsal ankle but this is fully epithelialized without evidence of infection. 03/28/16; we discharged this patient on Monday. Transitioned him into his own stocking. There were problems almost immediately with uncontrolled swelling weeping edema multiple some of which have opened. He does not feel systemically unwell in particular no chest pain no shortness of breath and he does not feel 04/08/16; the edema is under better control with the Profore light wrap but he still has pitting edema. There is one large wound anteriorly 2 on the medial aspect of his left leg and 3 small areas on  the superior posterior calf. Drainage is not excessive he is tolerating a Profore light well 04/15/16; put a Profore  wrap on him last week. This is controlled is edema however he had a lot of pain on his left anterior foot most of his wounds are healed 04/22/16 once again the patient has denuded areas on the left anterior foot which he states are because his wrap slips up word. He saw his primary physician today is on Lasix 40 twice a day and states that he his weight is down 20 pounds over the last 3 months. 04/29/16: Much improved. left anterior foot much improved. He is now on Lasix 80 mg per day. Much improved edema control 05/06/16; I was hoping to be able to discharge him today however once again he has blisters at a low level of where the compression was placed last week mostly on his left lateral but also his left medial leg and a small area on the anterior part of the left foot. 05/09/16; apparently the patient went home after his appointment on 7/4 later in the evening developing pain in his upper medial thigh together with subjective fever and chills although his temperature was not taken. The pain was so intense he felt he would probably have to call 911. However he then remembered that he had leftover doxycycline from a previous round of antibiotics and took these. By the next morning he felt a lot better. He called and spoke to one of our nurses and I approved doxycycline over the phone thinking that this was in relation to the wounds we had previously seen although they were definitely were not. The patient feels a lot better old fever no chills he is still working. Blood sugars are reasonably controlled 05/13/16; patient is back in for review of his cellulitis on his anterior medial upper thigh. He is taking doxycycline this is a lot better. Culture I did of the nodular area on the dorsal aspect of his foot grew MRSA this also looks a lot better. 05/20/16; the patient is cellulitis on the medial upper thigh has resolved. All of his wound areas including the left anterior foot, areas on the medial aspect of  the left calf and the lateral aspect of the calf at all resolved. He has a new blister on the left dorsal foot at the level of the fourth toe this was excised. No evidence of infection 05/27/16; patient continues to complain weeping edema. He has new blisterlike wounds on the left anterior lateral and posterior lateral calf at the top of his wrap levels. The area on his left anterior foot appears better. He is not complaining of fever, pain or pruritus in his feet. 05/30/16; the patient's blisters on his left anterior leg posterior calf all look improved. He did not increase the Lasix 100 mg as I suggested because he was going to run out of his 40 mg tablets. He is still having weeping edema of his toes 06/03/16; I renewed his Lasix at 80 mg once a day as he was about to run out when I last saw him. He is on 80 mg of Lasix now. I have asked him to cut down on the excessive amount of water he was drinking and asked him to drink according to his thirst mechanisms 06/12/2016 -- was seen 2 days ago and was supposed to wear his compression stockings at home but he is developed lymphedema and superficial blisters on the left lower extremity and hence came in  for a review 06/24/16; the remaining wound is on his left anterior leg. He still has edema coming from between his toes. There is lymphedema here however his edema is generally better than when I last saw this. He has a history of atrial fibrillation but does not have a known history of congestive heart failure nevertheless I think he probably has this at least on a diastolic basis. 07/01/16 I reviewed his echocardiogram from January 2017. This was essentially normal. He did not have LVH, EF of 55-60%. His right ventricular function was normal although he did have trivial tricuspid and pulmonic regurgitation. This is not audible on exam however. I increased his Lasix to do massive edema in his legs well above his knees I think in early July. He was also  drinking an excessive amount of water at the time. 07/15/16; missed his appointment last week because of the Labor Day holiday on Monday. He could not get another appointment later in the week. Started to feel the wrap digging in superiorly so we remove the top half and the bottom half of his wrap. He has extensive erythema and blistering superiorly in the left leg. Very tender. Very swollen. Edema in his foot with leaking edema fluid. He has not been systemically unwell 07/22/16; the area on the left leg laterally required some debridement. The medial wounds look more stable. His wrap injury wounds appear to have healed. Edema and his foot is better, weeping edema is also better. He tells me he is meeting with the supplier of the external compression pumps at work 08/05/16; the patient was on vacation last week in [name]. His wrap is been on for an extended period of time. Also over the weekend he developed an extensive area of tender erythema across his anterior medial thigh. He took to doxycycline yesterday that he had leftover from a previous prescription. The patient complains of weeping edema coming out of his toes 08/08/16; I saw this patient on 10/2. He was tender across his anterior thigh. I put him on doxycycline. He returns today in follow-up. He does not have any open wounds on his lower leg, he still has edema weeping into his toes. 08/12/16; patient was seen back urgently today to follow-up for his extensive left thigh cellulitis/erysipelas. He comes back with a lot less swelling and erythema pain is much better. I believe I gave him Augmentin and Cipro. His wrap was cut down as he stated a roll down his legs. He developed blistering above the level of the wrap that remained. He has 2 open blisters and 1 intact. 08/19/16; patient is been doing his primary doctor who is increased his Lasix from 40-80 once a day or 80 already has less edema. Cellulitis has remained improved in the left  thigh. 2 open areas on the posterior left calf 08/26/16; he returns today having new open blisters on the anterior part of his left leg. He has his compression pumps but is not yet been shown how to use some vital representative from the supplier. 09/02/16 patient returns today with no open wounds on the left leg. Some maceration in his plantar toes 09/10/2016 -- Dr. Leanord Hawking had recently discharged him on 09/02/2016 and he has come right back with redness swelling and some open ulcers on his left lower extremity. He says this was caused by trying to apply his compression stockings and he's been unable to use this and has not been able to use his lymphedema pumps. He had some  doxycycline leftover and he has started on this a few days ago. 09/16/16; there are no open wounds on his leg on the left and no evidence of cellulitis. 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 He does not complain of symptoms here. I am not clear use using his external compression pumps. 09/23/16; I have not seen this patient in 2 weeks. He canceled his appointment 10 days ago as he was going on vacation. He tells me that on Monday he noticed a large area on his posterior left leg which is been draining copiously and is reopened into a large wound. He is been using ABDs and the external part of his juxtalite, according to our nurse this was not on properly. 10/07/16; Still a substantial area on the posterior left leg. Using silver alginate 10/14/16; in general better although there is still open area which looks healthy. Still using silver alginate. He reminds me that this happen before he left for [facility]. T oday while he was showering in the morning. He had been using his juxtalite's 10/21/16; the area on his posterior left leg is fully epithelialized. However he arrives today with a large area of tender erythema in his medial and posterior left thigh just above the  knee. I have marked the area. Once again he is reluctant to consider hospitalization. I treated him with oral antibiotics in the past for a similar situation with resolution I think with doxycycline however this area it seems more extensive to me. He is not complaining of fever but does have chills and says states he is thirsty. His blood sugar today was in the 140s at home 10/25/16 the area on his posterior left leg is fully epithelialized although there is still some weeping edema. The large area of tenderness and erythema in his medial and posterior left thigh is a lot less tender although there is still a lot of swelling in this thigh. He states he feels a lot better. He is on doxycycline and Augmentin that I started last week. This will continued until Tuesday, December 26. I have ordered a duplex ultrasound of the left thigh rule out DVT whether there is an abscess something that would need to be drained I would also like to know. 11/01/16; he still has weeping edema from a not fully epithelialized area on his left posterior calf. Most of the rest of this looks a lot better. He has completed his antibiotics. His thigh is a lot better. Duplex ultrasound did not show a DVT in the thigh 11/08/16; he comes in today with more Denuded surface epithelium from the posterior aspect of his calf. There is no real evidence of cellulitis. The superior aspect of his wrap appears to have put quite an indentation in his leg just below the knee and this may have contributed. He does not complain of pain or fever. We have been using silver alginate as the primary dressing. The area of cellulitis in the right thigh has totally resolved. He has been using his compression stockings once a week 11/15/16; the patient arrives today with more loss of epithelium from the posterior aspect of his left calf. He now has a fairly substantial wound in this area. The reason behind this deterioration isn't exactly clear although his  edema is not well controlled. He states he feels he is generally more swollen systemically. He is not complaining of chest pain shortness of breath fever. T me he  has an appointment with his primary physician in early February. He is on 80 mg of oral ells Lasix a day. He claims compliance with the external compression pumps. He is not having any pain in his legs similar to what he has with his recurrent cellulitis 11/22/16; the patient arrives a follow-up of his large area on his left lateral calf. This looks somewhat better today. He came in earlier in the week for a dressing change since I saw him a week ago. He is not complaining of any pain no shortness of breath no chest pain 11/28/16; the patient arrives for follow-up of his large area on the left lateral calf this does not look better. In fact it is larger weeping edema. The surface of the wound does not look too bad. We have been using silver alginate although I'm not certain that this is a dressing issue. 12/05/16; again the patient follows up for a large wound on the left lateral and left posterior calf this does not look better. There continues to be weeping edema necrotic surface tissue. More worrisome than this once again there is erythema below the wound involving the distal Achilles and heel suggestive of cellulitis. He is on his feet working most of the day of this is not going well. We are changing his dressing twice a week to facilitate the drainage. 12/12/16; not much change in the overall dimensions of the large area on the left posterior calf. This is very inflamed looking. I gave him an. Doxycycline last week does not really seem to have helped. He found the wrap very painful indeed it seems to of dog into his legs superiorly and perhaps around the heel. He came in early today because the drainage had soaked through his dressings. 12/19/16- patient arrives for follow-up evaluation of his left lower extremity ulcers. He states that he is  using his lymphedema pumps once daily when there is "no drainage". He admits to not using his lipedema pumps while under current treatment. His blood sugars have been consistently between 150-200. 12/26/16; the patient is not using his compression pumps at home because of the wetness on his feet. I've advised him that I think it's important for him to use this daily. He finds his feet too wet, he can put a plastic bag over his legs while he is in the pumps. Otherwise I think will be in a vicious circle. We are using silver alginate to the major area on his left posterior calf 01/02/17; the patient's posterior left leg has further of all into 3 open wounds. All of them covered with a necrotic surface. He claims to be using his compression pumps once a day. His edema control is marginal. Continue with silver alginate 01/10/17; the patient's left posterior leg actually looks somewhat better. There is less edema, less erythema. Still has 3 open areas covered with a necrotic surface requiring debridement. He claims to be using his compression pumps once a day his edema control is better 01/17/17; the patient's left posterior calf look better last week when I saw him and his wrap was changed 2 days ago. He has noted increasing pain in the left heel and arrives today with much larger wounds extensive erythema extending down into the entire heel area especially tender medially. He is not systemically unwell CBGs have been controlled no fever. Our intake nurse showed me limegreen drainage on his AVD pads. 01/24/17; his usual this patient responds nicely to antibiotics last week giving him Levaquin  for presumed Pseudomonas. The whole entire posterior part of his leg is much better much less inflamed and in the case of his Achilles heel area much less tender. He has also had some epithelialization posteriorly there are still open areas here and still draining but overall considerably better 01/31/17- He has continue to  tolerate the compression wraps. he states that he continues to use the lymphedema pumps daily, and can increase to twice daily on the weekends. He is voicing no complaints or concerns regarding his LLE ulcers 02/07/17-he is here for follow-up evaluation. He states that he noted some erythema to the left medial and anterior thigh, which he states is new as of yesterday. He is concerned about recurrent cellulitis. He states his blood sugars have been slightly elevated, this morning in the 180s 02/14/17; he is here for follow-up evaluation. When he was last here there was erythema superiorly from his posterior wound in his anterior thigh. He was prescribed Levaquin however a culture of the wound surface grew MRSA over the phone I changed him to doxycycline on Monday and things seem to be a lot better. 02/24/17; patient missed his appointment on Friday therefore we changed his nurse visit into a physician visit today. Still using silver alginate on the large area of the posterior left thigh. He isn't new area on the dorsal left second toe 03/03/17; actually better today although he admits he has not used his external compression pumps in the last 2 days or so because of work responsibilities over the weekend. 03/10/17; continued improvement. External compression pumps once a day almost all of his wounds have closed on the posterior left calf. Better edema control 03/17/17; in general improved. He still has 3 small open areas on the lateral aspect of his left leg however most of the area on the posterior part of his leg is epithelialized. He has better edema control. He has an ABD pad under his stocking on the right anterior lower leg although he did not let us look at that today. 03/24/17; patient arrives back in clinic today with no open areas however there are areas on the posterior left calf and anterior left calf that are less than 100% epithelialized. His edema is well controlled in the left lower leg. There  is some pitting edema probably lymphedema in the left upper thigh. He uses compression pumps at home once per day. I tried to get him to do this twice a day although he is very reticent. 04/01/2017 -- for the last 2 days he's had significant redness, tenderness and weeping and came in for an urgent visit today. 04/07/17; patient still has 6 more days of doxycycline. He was seen by Dr. Meyer Russel last Wednesday for cellulitis involving the posterior aspect, lateral aspect of his Involving his heel. For the most part he is better there is less erythema and less weeping. He has been on his feet for 12 hours o2 over the weekend. Using his compression pumps once a day 04/14/17 arrives today with continued improvement. Only one area on the posterior left calf that is not fully epithelialized. He has intense bilateral venous inflammation associated with his chronic venous insufficiency disease and secondary lymphedema. We have been using silver alginate to the left posterior calf wound In passing he tells [place] today that the right leg but we have not seen in quite some time has an open area on it but he doesn't want [place] to look at this today states he will show this  to [place] next week. 04/21/17; there is no open area on his left leg although he still reports some weeping edema. He showed [place] his right leg today which is the first time we've seen this leg in a long time. He has a large area of open wound on the right leg anteriorly healthy granulation. Quite a bit of swelling in the right leg and some degree of venous inflammation. He told us about the right leg in passing last week but states that deterioration in the right leg really only happened over the weekend 04/28/17; there is no open area on the left leg although there is an irritated part on the posterior which is like a wrap injury. The wound on the right leg which was new from last week at least to [place] is a lot better. 05/05/17; still no open area on the left leg.  Patient is using his new compression stocking which seems to be doing a good job of controlling the edema. He states he is using his compression pumps once per day. The right leg still has an open wound although it is better in terms of surface area. Required debridement. A lot of pain in the posterior right Achilles marked tenderness. Usually this type of presentation this patient gives concern for an active cellulitis 05/12/17; patient arrives today with his major wound from last week on the right lateral leg somewhat better. Still requiring debridement. He was using his compression stocking on the left leg however that is reopened with superficial wounds anteriorly he did not have an open wound on this leg previously. He is still using his juxta light's once daily at night. He cannot find the time to do this in the morning as he has to be at work by 7 AM 05/19/17; right lateral leg wound looks improved. No debridement required. The concerning area is on the left posterior leg which appears to almost have a subcutaneous hemorrhagic component to it. We've been using silver alginate to all the wounds 05/26/17; the right lateral leg wound continues to look improved. However the area on the left posterior calf is a tightly adherent surface. [place] using silver alginate. Because of the weeping edema in his legs there is very little good alternatives. 06/02/17; the patient left here last week looking quite good. Major wound on the left posterior calf and a small one on the right lateral calf. Both of these look satisfactory. He tells me that by Wednesday he had noted increased pain in the left leg and drainage. He called on Thursday and Friday to get an appointment here but we were blocked. He did not go to urgent care or his primary physician. He thinks he had a fever on Thursday but did not actually take his temperature. He has not been using his compression pumps on the left leg because of pain. I advised  him to go to the emergency room today for IV antibiotics for stents of left leg cellulitis but he has refused I have asked him to take 2 days off work to keep his leg elevated and he has refused this as well. In view of this I'm going to call him and Augmentin and doxycycline. 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 However  after a detailed discussion regarding the risk benefits and alternatives he refuses to get admitted to the [name]. With no other recourse I will continue him on oral antibiotics as before and hopefully he'll have his infectious disease consultation this week. 06/16/2017 -- the patient was seen today by the nurse practitioner at infectious disease Ms. Dixon. 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 He has also been advise Lotrimin cream locally. she also had wise IV antibiotics for his condition if it flares up 06/23/17; patient arrives today with drainage bilaterally although the remaining wound on the left posterior calf after cleaning up today "highlighter yellow drainage" did not look too bad. Unfortunately he has had breakdown on the right anterior leg [previously this leg had not been open and he is using a black stocking] he went to see infectious disease and is been put on clindamycin 150 mg daily, I did not verify the dose although I'm not familiar with using clindamycin in this dosing range, perhaps for prophylaxisoo 06/27/17; I brought this patient back today to follow-up on the wound deterioration on the right lower leg together with surrounding  cellulitis. I started him on doxycycline 4 days ago. This area looks better however he comes in today with intense cellulitis on the medial part of his left thigh. This is not have a wound in this area. Extremely tender. We've been using silver alginate to the wounds on the right lower leg left lower leg with bilateral 4 layer compression he is using his external compression pumps once a day 07/04/17; patient's left medial thigh cellulitis looks better. He has not been using his compression pumps as his insert said it was contraindicated with cellulitis. His right leg continues to make improvements all the wounds are still open. We only have one remaining wound on the left posterior calf. Using silver alginate to all open areas. He is on doxycycline which I started a week ago and should be finishing I gave him Augmentin after Thursday's visit for the severe cellulitis on the left medial thigh which fortunately looks better 07/14/17; the patient's left medial thigh cellulitis has resolved. The cellulitis in his right lower calf on the right also looks better. All of his wounds are stable to improved we've been using silver alginate he has completed the antibiotics I have given him. He has clindamycin 150 mg once a day prescribed by infectious disease for prophylaxis, I've advised him to start this now. We have been using bilateral Unna boots over silver alginate to the wound areas 07/21/17; the patient is been to see infectious disease who noted his recurrent problems with cellulitis. He was not able to tolerate prophylactic clindamycin therefore he is on amoxicillin 500 twice a day. He also had a second daily dose of Lasix added By Dr. Oneta Rack but he is not taking this. Nor is he being completely compliant with his compression pumps a especially not this week. He has 2 remaining wounds one on the right posterior lateral lower leg and one on the left posterior medial lower leg. 07/28/17; maintain on Amoxil  500 twice a day as prophylaxis for recurrent cellulitis as ordered by infectious disease. The patient has Unna boots bilaterally. Still wounds on his right lateral, left medial, and a new open area on the left anterior lateral lower  leg 08/04/17; he remains on amoxicillin twice a day for prophylaxis of recurrent cellulitis. He has bilateral Unna boots for compression and silver alginate to his wounds. Arrives today with his legs looking as good as I have seen him in quite some time. Not surprisingly his wounds look better as well with improvement on the right lateral leg venous insufficiency wound and also the left medial leg. He is still using the compression pumps once a day 08/11/17; both legs appear to be doing better wounds on the right lateral and left medial legs look better. Skin on the right leg quite good. He is been using silver alginate as the primary dressing. I'm going to use Anasept gel calcium alginate and maintain all the secondary dressings 08/18/17; the patient continues to actually do quite well. The area on his right lateral leg is just about closed the left medial also looks better although it is still moist in this area. His edema is well controlled we have been using Anasept gel with calcium alginate and the usual secondary dressings, 4 layer compression and once daily use of his compression pumps "always been able to manage 09/01/17; the patient continues to do reasonably well in spite of his trip to [place]. The area on the right lateral leg is epithelialized. Left is much better but still open. He has more edema and more chronic erythema on the left leg [venous inflammation] 09/08/17; he arrives today with no open wound on the right lateral leg and decently controlled edema. Unfortunately his left leg is not nearly as in his good situation as last week.he apparently had increasing edema starting on Saturday. He edema soaked through into his foot so used a plastic bag to walk  around his home. The area on the medial right leg which was his open area is about the same however he has lost surface epithelium on the left lateral which is new and he has significant pain in the Achilles area of the left foot. He is already on amoxicillin chronically for prophylaxis of cellulitis in the left leg 09/15/17; he is completed a week of doxycycline and the cellulitis in the left posterior leg and Achilles area is as usual improved. He still has a lot of edema and fluid soaking through his dressings. There is no open wound on the right leg. He saw infectious disease NP today 09/22/17;As usual 1 we transition him from our compression wraps to his stockings things did not go well. He has several small open areas on the right leg. He states this was caused by the compression wrap on his skin although he did not wear this with the stockings over them. He has several superficial areas on the left leg medially laterally posteriorly. He does not have any evidence of active cellulitis especially involving the left Achilles The patient is traveling from [name] Saturday going to [name]. He states he isn't attempting to get an appointment with a heel objects wound center there to change his dressings. I am not completely certain whether this will work 10/06/17; the patient came in on Friday for a nurse visit and the nurse reported that his legs actually look quite good. He arrives in clinic today for his regular follow-up visit. He has a new wound on his left third toe over the PIP probably caused by friction with his footwear. He has small areas on the left leg and a very superficial but epithelialized area on the right anterior lateral lower leg. Other than that  his legs look as good as I've seen him in quite some time. We have been using silver alginate Review of systems; no chest pain no shortness of breath other than this a 10 point review of systems negative 10/20/17; seen by Dr.  Meyer Russel last week. He had taken some antibiotics [doxycycline] that he had left over. Dr. Meyer Russel thought he had candida infection and declined to give him further antibiotics. He has a small wound remaining on the right lateral leg several areas on the left leg including a larger area on the left posterior several left medial and anterior and a small wound on the left lateral. The area on the left dorsal third toe looks a lot better. ROS; Gen.; no fever, respiratory no cough no sputum Cardiac no chest pain other than this 10 point review of [name] is negative 10/30/17; patient arrives today having fallen in the bathtub 3 days ago. It took him a while to get up. He has pain and maceration in the wounds on his left leg which have deteriorated. He has not been using his pumps he also has some maceration on the right lateral leg. 11/03/17; patient continues to have weeping edema especially in the left leg. This saturates his dressings which were just put on on 12/27. As usual the doxycycline seems to take care of the cellulitis on his lower leg. He is not complaining of fever, chills, or other systemic symptoms. He states his leg feels a lot better on the doxycycline I gave him empirically. He also apparently gets injections at his primary doctor's officeo Rocephin for cellulitis prophylaxis. I didn't ask him about his compression pump compliance today I think that's probably marginal. Arrives in the clinic with all of his dressings primary and secondary macerated full of fluid and he has bilateral edema 11/10/17; the patient's right leg looks some better although there is still a cluster of wounds on the right lateral. The left leg is inflamed with almost circumferential skin loss medially to laterally although we are still maintaining anteriorly. He does not have overt cellulitis there is a lot of drainage. He is not using compression pumps. We have been using silver alginate to the wound areas, there are  not a lot of options here 11/17/17; the patient's right leg continues to be stable although there is still open wounds, better than last week. The inflammation in the left leg is better. Still loss of surface layer epithelium especially posteriorly. There is no overt cellulitis in the amount of edema and his left leg is really quite good, tells me he is using his compression pumps once a day. 11/24/17; patient's right leg has a small superficial wound laterally this continues to improve. The inflammation in the left leg is still improving however we have continuous surface layer epithelial loss posteriorly. There is no overt cellulitis in the amount of edema in both legs is really quite good. He states he is using his compression pumps on the left leg once a day for 5 out of 7 days 12/01/17; very small superficial areas on the right lateral leg continue to improve. Edema control in both legs is better today. He has continued loss of surface epithelialization and left posterior calf although I think this is better. We have been using silver alginate with large number of absorptive secondary dressings 4 layer on the left Unna boot on the right at his request. He tells me he is using his compression pumps once a day 12/08/17; he has  no open area on the right leg is edema control is good here. ooOn the left leg however he has marked erythema and tenderness breakdown of skin. He has what appears to be a wrap injury just distal to the popliteal fossa. 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 There was swelling erythema and breakdown in the right leg. He has much worse skin breakdown in the left leg as well multiple open areas medially  and posteriorly as well as laterally. He tells me he has been using his compression pumps but tells me he feels that the drainage out of his leg is worse when he uses a compression pumps. T be fair to him he is been saying this o for a while however I don't know that I have really been listening to this. I wonder if the compression pumps are working properly 12/22/17;. Once again he arrives with severe erythema, weeping edema from the left greater than right leg. Noncompliance with compression pumps. New this visit he is complaining of pain on the lateral aspect of the right leg and the medial aspect of his right thigh. He apparently saw his cardiologist Dr. [name] who was ordered an echocardiogram area and I think this is a step in the right direction 12/25/17; started his doxycycline Monday night. There is still intense erythema of the right leg especially in the anterior thigh although there is less tenderness. The erythema around the wound on the right lateral calf also is less tender. He still complaining of pain in the left heel. His wounds are about the same right lateral left medial left lateral. Superficial but certainly not close to closure. He denies being systemically unwell no fever chills no abdominal pain no diarrhea 12/29/17; back in follow-up of his extensive right calf and right thigh cellulitis. I added amoxicillin to cover possible doxycycline resistant strep. This seems to of done the trick he is in much less pain there is much less erythema and swelling. He has his echocardiogram at 11:00 this morning. X-ray of the left heel was also negative. 01/05/18; the patient arrived with his edema under much better control. Now that he is retired he is able to use his compression pumps daily and sometimes twice a day per the patient. He has a wound on the right leg the lateral wound looks better. Area on the left leg also looks a lot better. He has no evidence of cellulitis in his bilateral  thighs I had a quick peak at his echocardiogram. He is in normal ejection fraction and normal left ventricular function. He has moderate pulmonary hypertension moderately reduced right ventricular function. One would have to wonder about chronic sleep apnea although he says he doesn't snore. He'll review the echocardiogram with his cardiologist. 01/12/18; the patient arrives with the edema in both legs under exemplary control. He is using his compression pumps daily and sometimes twice daily. His wound on the right lateral leg is just about closed. He still has some weeping areas on the posterior left calf and lateral left calf although everything is just about closed here as well. I have spoken with Aldean Baker who is the patient's nurse practitioner and infectious disease. She was concerned that the patient had  not understood that the parenteral penicillin injections he was receiving for cellulitis prophylaxis was actually benefiting him. I don't think the patient actually saw that I would tend to agree we were certainly dealing with less infections although he had a serious one last month. 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 He is healed. He'll be placed in juxtalite compression wraps and increase his lymphedema pumps to twice daily. We will follow up again next week to ensure there are no issues with the new regiment. 01/20/18-he is here for evaluation of bilateral lower extremity weeping edema. Yesterday he was placed in compression wrap to the right lower extremity and compression stocking to left lower shrubbery. He states he uses lymphedema pumps last night and again this morning and noted a blister to the left lower extremity. On exam he was noted to have drainage to the right lower extremity. He will be placed in Unna boots bilaterally and follow-up next week 01/26/18; patient was actually discharged a week ago to his own juxta light stockings only  to return the next day with bilateral lower extremity weeping edema.he was placed in bilateral Unna boots. He arrives today with pain in the back of his left leg. There is no open area on the right leg however there is a linear/wrap injury on the left leg and weeping edema on the left leg posteriorly. I spoke with infectious disease about 10 days ago. They were disappointed that the patient elected to discontinue prophylactic intramuscular penicillin shots as they felt it was particularly beneficial in reducing the frequency of his cellulitis. I discussed this with the patient today. He does not share this view. He'll definitely need antibiotics today. Finally he is traveling to [place] and trauma leaving this Saturday and returning a week later and he does not travel with his pumps. He is going by car 01/30/18; patient was seen 4 days ago and brought back in today for review of cellulitis in the left leg posteriorly. I put him on amoxicillin this really hasn't helped as much as I might like. He is also worried because he is traveling to [name] trauma by car. Finally we will be rewrapping him. There is no open area on the right leg over his left leg has multiple weeping areas as usual 02/09/18; The same wrap on for 10 days. He did not pick up the last doxycycline I prescribed for him. He apparently took 4 days worth he already had. There is nothing open on his right leg and the edema control is really quite good. He's had damage in the left leg medially and laterally especially probably related to the prolonged use of Unna boots 02/12/18; the patient arrived in clinic today for a nurse visit/wrap change. He complained of a lot of pain in the left posterior calf. He is taking doxycycline that I previously prescribed for him. Unfortunately even though he used his stockings and apparently used to compression pumps twice a day he has weeping edema coming out of the lateral part of his right leg. This is  coming from the lower anterior lateral skin area. 02/16/18; the patient has finished his doxycycline and will finish the amoxicillin 2 days. The area of cellulitis in the left calf posteriorly has resolved. He is no longer having any pain. He tells me he is using his compression pumps at least once a day sometimes twice. 02/23/18; the patient finished his doxycycline and Amoxil last week. On Friday he noticed a  small erythematous circle about the size of a quarter on the left lower leg just above his ankle. This rapidly expanded and he now has erythema on the lateral and posterior part of the thigh. This is bright red. Also has an area on the dorsal foot just above his toes and a tender area just below the left popliteal fossa. He came off his prophylactic penicillin injections at his own insistence one or 2 months ago. This is obviously deteriorated since then 03/02/18; patient is on doxycycline and Amoxil. Culture I did last week of the weeping area on the back of his left calf grew group B strep. I have therefore renewed the amoxicillin 500 3 times a day for a further week. He has not been systemically unwell. Still complaining of an area of discomfort right under his left popliteal fossa. There is no open wound on the right leg. He tells me that he is using his pumps twice a day on most days 03/09/18; patient arrives in clinic today completing his amoxicillin today. The cellulitis on his left leg is better. Furthermore he tells me that he had intramuscular penicillin shots that his primary care office today. However he also states that the wrap on his right leg fell down shortly after leaving clinic last week. He developed a large blister that was present when he came in for a nurse visit later in the week and then he developed intense discomfort around this area.He tells me he is using his compression pumps 03/16/18; the patient has completed his doxycycline. The infectious part of this/cellulitis in  the left heel area left popliteal area is a lot better. He has 2 open areas on the right calf. Still areas on the left calf but this is a lot better as well. 03/24/18; the patient arrives complaining of pain in the left popliteal area again. He thinks some of this is wrap injury. He has no open area on the right leg and really no open area on the left calf either except for the popliteal area. He claims to be compliant with the compression pumps 03/31/18; I gave him doxycycline last week because of cellulitis in the left popliteal area. This is a lot better although the surface epithelium is denuded off and response to this. He arrives today with uncontrolled edema in the right calf area as well as a fingernail injury in the right lateral calf. There is only a few open areas on the left 04/06/18; I gave him amoxicillin doxycycline over the last 2 weeks that the amoxicillin should be completing currently. He is not complaining of any pain or systemic symptoms. The only open areas see has is on the right lateral lower leg paradoxically I cannot see anything on the left lower leg. He tells me he is using his compression pumps twice a day on most days. Silver alginate to the wounds that are open under 4 layer compression 04/13/18; he completed antibiotics and has no new complaints. Using his compression pumps. Silver alginate that anything that's opened 04/20/18; he is using his compression pumps religiously. Silver alginate 4 layer compression anything that's opened. He comes in today with no open wounds on the left leg but 3 on the right including a new one posteriorly. He has 2 on the right lateral and one on the right posterior. He likes Unna boots on the right leg for reasons that aren't really clear we had the usual 4 layer compression on the left. It may be necessary to  move to the 4 layer compression on the right however for now I left them in the Unna boots 04/27/18; he is using his compression pumps at  least once a day. He has still the wounds on the right lateral calf. The area right posteriorly has closed. He does not have an open wound on the left under 4 layer compression however on the dorsal left foot just proximal to the toes and the left third toe 2 small open areas were identified 05/11/18; he has not uses compression pumps. The areas on the right lateral calf have coalesced into one large wound necrotic surface. On the left side he has one small wound anteriorly however the edema is now weeping out of a large part of his left leg. He says he wasn't using his pumps because of the weeping fluid. I explained to him that this is the time he needs to pump more 05/18/18; patient states he is using his compression pumps twice a day. The area on the right lateral large wound albeit superficial. On the left side he has innumerable number of small new wounds on the left calf particularly laterally but several anteriorly and medially. All these appear to have healthy granulated base these look like the remnants of blisters however they occurred under compression. The patient arrives in clinic today with his legs somewhat better. There is certainly less edema, less multiple open areas on the left calf and the right anterior leg looks somewhat better as well superficial and a little smaller. However he relates pain and erythema over the last 3-4 days in the thigh and I looked at this today. He has not been systemically unwell no fever no chills no change in blood sugar values 05/25/18; comes in today in a better state. The severe cellulitis on his left leg seems better with the Keflex. Not as tender. He has not been systemically unwell ooHard to find an open wound on the left lower leg using his compression pumps twice a day ooThe confluent wounds on his right lateral calf somewhat better looking. These will ultimately need debridement I didn't do this today. 06/01/18; the severe cellulitis on the left  anterior thigh has resolved and he is completed his Keflex. ooThere is no open wound on the left leg however there is a superficial excoriation at the base of the third toe dorsally. Skin on the bottom of his left foot is macerated looking. ooThe left the wounds on the lateral right leg actually looks some better although he did require debridement of the top half of this wound area with an open curet 06/09/18 on evaluation today patient appears to be doing poorly in regard to his right lower extremity in particular this appears to likely be infected he has very thick purulent discharge along with a bright green tent to the discharge. This makes me concerned about the possibility of pseudomonas. He's also having increased discomfort at this point on evaluation. Fortunately there does not appear to be any evidence of infection spreading to the other location at this time. 06/16/18 on evaluation today patient appears to actually be doing fairly well. His ulcer has actually diminished in size quite significantly at this point which is good news. Nonetheless he still does have some evidence of infection he did see infectious disease this morning before coming here for his appointment. I did review the results of their evaluation and their note today. They did actually have him discontinue the Cipro and initiate treatment with linezolid  at this time. He is doing this for the next seven days and they recommended a follow-up in four months with them. He is the keep a log of the need for intermittent antibiotic therapy between now and when he falls back up with infectious disease. This will help them gaze what exactly they need to do to try and help them out. 06/23/18; the patient arrives today with no open wounds on the left leg and left third toe healed. He is been using his compression pumps twice a day. On the right lateral leg he still has a sizable wound but this is a lot better than last time I saw this.  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 Has been using silver alginate under 4 layer compression 06/30/18; the only open wound he has is on the right lateral leg and this looks healthy. No debridement is required. We have been using silver alginate. He does not have an open wound on the left leg. There is apparently some drainage from the dorsal proximal third toe on the left although I see no open wound here. 07/03/18 on evaluation today patient was actually here just for a nurse visit rapid change. However when he was here on Wednesday for his rat change due to having been healed on the left and then developing blisters we initiated the wrap again knowing that he would be back today for [place] to reevaluate and see were at. Unfortunately he has developed some cellulitis into the proximal portion of his right lower extremity even into the region of his thigh. 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 He was placed on one as what at that point. Nonetheless he is done with that and has been tolerating it well otherwise. Doxycycline which in the past really did not seem to be effective for him. Nonetheless I think the best option may be for [place] to definitely reinitiate the antibiotics for a longer period of time. 07/07/18; since I last saw this patient a week ago he has had a difficult time. At that point he did not have an open wound on his left leg. We transitioned him into juxta light stockings. He was apparently in the clinic the next day with blisters on the left lateral and left medial lower calf. He also had weeping edema fluid. He was put back into a compression wrap. He was also in the clinic on Friday with intense erythema in his right thigh. Per the patient he was started on Bactrim however that didn't work at all in terms of relieving his pain and  swelling. He has taken 3 doxycycline that he had left over from last time and that seems to of helped. He has blistering on the right thigh as well. 07/14/18; the erythema on his right thigh has gotten better with doxycycline that he is finishing. 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 Presumably this cellulitis in the thigh was not related to that although I think this is a bit concerning going forward. He still has an area on the right lateral calf the blister on the right medial calf just below the knee that was discussed above. On the left 2 small open areas left medial and left lateral. Edema control is adequate. He  is using his compression pumps twice a day 07/20/18; continued improvement in the condition of both legs especially the edema in his bilateral thighs. He tells me he is been losing weight through a combination of diet and exercise. He is using his compression pumps twice a day. So overall she made to the remaining wounds 07/27/2018; continued improvement in condition of both legs. His edema is well controlled. The area on the right lateral leg is just about closed he had one blisters show up on the medial left upper calf. We have him in 4 layer compression. He is going on a 10-day trip to [place]. He will be driving. He wants to wear Unna boots because of the lessening amount of constriction. He will not use compression pumps while he is away 08/05/18 on evaluation today patient actually appears to be doing decently well all things considered in regard to his bilateral lower extremities. The worst ulcer is actually only posterior aspect of his left lower extremity with a four layer compression wrap cut into his leg a couple weeks back. He did have a trip and actually had [employer] for the trip that he is worn since he was last here. Nonetheless he feels like the [employer]  actually do better for him his swelling is up a little bit but he also with his trip was not taking his Lasix on a regular set schedule like he was supposed to be. He states that obviously the reason being that he cannot drive and keep going without having to urinate too frequently which makes it difficult. He did not have his pumps with him while he was away either which I think also maybe playing a role here too. 08/13/2018; the patient only has a small open wound on the right lateral calf which is a big improvement in the last month or 2. He also has the area posteriorly just below the posterior fossa on the left which I think was a wrap injury from several weeks ago. He has no current evidence of cellulitis. He tells me he is back into his compression pumps twice a day. He also tells me that while he was at the laundromat somebody stole a section of his extremitease stockings 08/20/2018; back in the clinic with a much improved state. He only has small areas on the right lateral mid calf which is just about healed. This was is more substantial area for quite a prolonged period of time. He has a small open area on the left anterior tibia. The area on the posterior calf just below the popliteal fossa is closed today. He is using his compression pumps twice a day 08/28/2018; patient has no open wound on the right leg. He has a smattering of open areas on the calf with some weeping lymphedema. More problematically than that it looks as though his wraps of slipped down in his usual he has very angry upper area of edema just below the right medial knee and on the right lateral calf. He has no open area on his feet. The patient is traveling to [name] next week. I will send him in an antibiotic. We will continue to wrap the right leg. We ordered extremitease stockings for him last week and I plan to transition the right leg to a stocking when he gets home which will be in 10 days time. As usual he is  very reluctant to take his pumps with him when  he travels 09/07/2018; patient returns from [facility]. He shows me a picture of his left leg in the mid part of his trip last week with intense fire engine erythema. The picture look bad enough I would have considered sending him to the [name]. Instead he went to the wound care center in [name]. They did not prescribe him antibiotics but he did take some doxycycline he had leftover from a previous visit. I had given him trimethoprim sulfamethoxazole before he left this did not work according to the patient. This is resulted in some improvement fortunately. He comes back with a large wound on the left posterior calf. Smaller area on the left anterior tibia. Denuded blisters on the dorsal left foot over his toes. Does not have much in the way of wounds on the right leg although he does have a very tender area on the right posterior area just below the popliteal fossa also suggestive of infection. He promises me he is back on his pumps twice a day 09/15/2018; the intense cellulitis in his left lower calf is a lot better. The wound area on the posterior left calf is also so better. However he has reasonably extensive wounds on the dorsal aspect of his second and third toes and the proximal foot just at the base of the toes. There is nothing open on the right leg 09/22/2018; the patient has excellent edema control in his legs bilaterally. He is using his external compression pumps twice a day. He has no open area on the right leg and only the areas in the left foot dorsally second and third toe area on the left side. He does not have any signs of active cellulitis. 10/06/2018; the patient has good edema control bilaterally. He has no open wound on the right leg. There is a blister in the posterior aspect of his left calf that we had to deal with today. He is using his compression pumps twice a day. There is no signs of active cellulitis. We  have been using silver alginate to the wound areas. He still has vulnerable areas on the base of his left first second toes dorsally He has a his extremities stockings and we are going to transition him today into the stocking on the right leg. He is cautioned that he will need to continue to use the compression pumps twice a day. If he notices uncontrolled edema in the right leg he may need to go to 3 times a day. 10/13/2018; the patient came in for a nurse check on Friday he has a large flaccid blister on the right medial calf just below the knee. We unroofed this. He has this and a new area underneath the posterior mid calf which was undoubtedly a blister as well. He also has several small areas on the right which is the area we put his extremities stocking on. 10/19/2018; the patient went to see infectious disease this morning I am not sure if that was a routine follow-up in any case the doxycycline I had given him was discontinued and started on linezolid. He has not started this. It is easy to look at his left calf and the inflammation and think this is cellulitis however he is very tender in the tissue just below the popliteal fossa and I have no doubt that there is infection going on here. He states the problem he is having is that with the compression pumps the edema goes down and then starts walking the wrap  falls down. We will see if we can adhere this. He has 1 or 2 minuscule open areas on the right still areas that are weeping on the posterior left calf, the base of his left second and third toes 10/26/18; back today in clinic with quite of skin breakdown in his left anterior leg. This may have been infection the area below the popliteal fossa seems a lot better however tremendous epithelial loss on the left anterior mid tibia area over quite inexpensive tissue. He has 2 blisters on the right side but no other open wound here. 10/29/2018; came in urgently to see [place] today and we worked him  in for review. He states that the 4 layer compression on the right leg caused pain he had to cut it down to roughly his mid calf this caused swelling above the wrap and he has blisters and skin breakdown today. As a result of the pain he has not been using his pumps. Both legs are a lot more edematous and there is a lot of weeping fluid. 11/02/18; arrives in clinic with continued difficulties in the right leg> left. Leg is swollen and painful. multiple skin blisters and new open areas especially laterally. He has not been using his pumps on the right leg. 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 He is not systemically unwell. 11/09/2018; the patient claims he is being compliant with his pumps. He is finished the doxycycline I gave him last week. Culture I did of the wound on the right lateral leg showed a few very resistant methicillin staph aureus. This was resistant to doxycycline. Nevertheless he states the pain in the leg is a lot better which makes me wonder if the cultured organism was not really what was causing the problem nevertheless this is a very dangerous organism to be culturing out of any wound. His right leg is still a lot larger than the left. He is using an Radio broadcast assistant on this area, he blames a 4-layer compression for causing the original skin breakdown which I doubt is true however I cannot talk him out of it. We have been using silver alginate to all of these areas which were initially blisters 11/16/2018; patient is being compliant with his external compression pumps at twice a day. Miraculously he arrives in clinic today with absolutely no open wounds. He has better edema control on the left where he has been using 4 layer compression versus wound of wounds on the right and I pointed this out to him. There is no inflammation in the skin in his lower legs which is also somewhat unusual for him. There is no open wounds on the dorsal left  foot. He has extremitease stockings at home and I have asked him to bring these in next week. 11/25/18 patient's lower extremity on examination today on the left appears for the most part to be wound free. He does have an open wound on the lateral aspect of the right lower extremity but this is minimal compared to what I've seen in past. He does request that we go ahead and wrap the left leg as well even though there's nothing open just so hopefully it will not reopen in short order. 1/28; patient has superficial open wounds on the right lateral calf left anterior calf and left posterior calf. His edema control is adequate. He has an area of very tender erythematous skin at the superior upper part of his calf compatible with  his recurrent cellulitis. We have been using silver alginate as the primary dressing. He claims compliance with his compression pumps 2/4; patient has superficial open wounds on numerous areas of his left calf and again one on the left dorsal foot. The areas on the right lateral calf have healed. The cellulitis that I gave him doxycycline for last week is also resolved this was mostly on the left anterior calf just below the tibial tuberosity. His edema looks fairly well-controlled. He tells me he went to see his primary doctor today and had blood work ordered 2/11; once again he has several open areas on the left calf left tibial area. Most of these are small and appear to have healthy granulation. He does not have anything open on the right. The edema and control in his thighs is pretty good which is usually a good indication he has been using his pumps as requested. 2/18; he continues to have several small areas on the left calf and left tibial area. Most of these are small healthy granulation. We put him in his stocking on the right leg last week and he arrives with a superficial open area over the right upper tibia and a fairly large area on the right lateral tibia in similar  condition. His edema control actually does not look too bad, he claims to be using his compression pumps twice a day 2/25. Continued small areas on the left calf and left tibial area. New areas especially on the right are identified just below the tibial tuberosity and on the right upper tibia itself. There are also areas of weeping edema fluid even without an obvious wound. He does not have a considerable degree of lymphedema but clearly there is more edema here than his skin can handle. He states he is using the pumps twice a day. We have an Unna boot on the right and 4 layer compression on the left. 3/3; he continues to have an area on the right lateral calf and right posterior calf just below the popliteal fossa. There is a fair amount of tenderness around the wound on the popliteal fossa but I did not see any evidence of cellulitis, could just be that the wrap came down and rubbed in this area. ooHe does not have an open area on the left leg however there is an area on the left dorsal foot at the base of the third toe ooWe have been using silver alginate to all wound areas 3/10; he did not have an open area on his left leg last time he was here a week ago. T oday he arrives with a horizontal wound just below the tibial tuberosity and an area on the left lateral calf. He has intense erythema and tenderness in this area. The area is on the right lateral calf and right posterior calf better than last week. We have been using silver alginate as usual 3/18 - Patient returns with 3 small open areas on left calf, and 1 small open area on right calf, the skin looks ok with no significant erythema, he continues the UNA boot on right and 4 layer compression on left. The right lateral calf wound is closed , the right posterior is small area. we will continue silver alginate to the areas. 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 He is been tolerating the dressing changes without complication. Fortunately she has made excellent improvement in regard to the overall status of his wounds. Unfortunately every time we cease wrapping him he ends up reopening in causing more significant issues at that point. Again I'm unsure of the best direction to take although I think the lymphedema clinic may be appropriate for him. 02/03/19 on evaluation today patient appears to be doing well in regard to the wounds that we saw him for last week unfortunately he has a new area on the proximal portion of his right medial/posterior lower extremity where the wrap somewhat slowed down and caused swelling and a blister to rub and open. Unfortunately this is the only opening that he has on either leg at this point. 02/17/19 on evaluation today patient's bilateral lower extremities appear to be doing well. He still completely healed in regard to the left lower extremity. In regard to the right lower extremity the area where the wrap and slid down and caused the blister still seems to be slightly open although this is dramatically better than during the last evaluation two weeks ago. I'm very pleased with the way this stands overall. 03/03/19 on evaluation today patient appears to be doing well in regard to his right lower extremity in general although he did have a new blister open this does not appear to be showing any evidence of active infection at this time. Fortunately there's No fevers, chills, nausea, or vomiting noted at this time. Overall I feel like he is making good progress it does feel like that the right leg will we perform the [employer] seems to do with a bit better than three layer wrap on the left which slid down on him. We may switch to doing bilateral in the book wraps. 5/4; I have not seen Mr. Yonts in quite  some time. According to our case manager he did not have an open wound on his left leg last week. He had 1 remaining wound on the right posterior medial calf. He arrives today with multiple openings on the left leg probably were blisters and/or wrap injuries from Unna boots. I do not think the Unna boot's will provide adequate compression on the left. I am also not clear about the frequency he is using the compression pumps. 03/17/19 on evaluation today patient appears to be doing excellent in regard to his lower extremities compared to last week's evaluation apparently. He had gotten significantly worse last week which is unfortunate. The [employer] wrap on the left did not seem to do very well for him at all and in fact it didn't control his swelling significantly enough he had an additional outbreak. Subsequently we go back to the four layer compression wrap on the left. This is good news. At least in that he is doing better and the wound seem to be killing him. He still has not heard anything from the lymphedema clinic. 03/24/19 on evaluation today patient actually appears to be doing much better in regard to his bilateral lower Trinity as compared to last week when I saw him. Fortunately there's no signs of active infection at this time. He has been tolerating the dressing changes without complication. Overall I'm extremely pleased with the progress and appearance in general. 04/07/19 on evaluation today patient appears to be doing well in regard to his bilateral lower extremities. His swelling is significantly down from where it was previous. With that being said he does have a couple  blisters still open at this point but fortunately nothing that seems to be too severe and again the majority of the larger openings has healed at this time. 04/14/19 on evaluation today patient actually appears to be doing quite well in regard to his bilateral lower extremities in fact I'm not even sure there's  anything significantly open at this time at any site. Nonetheless he did have some trouble with these wraps where they are somewhat irritating him secondary to the fact that he has noted that the graph wasn't too close down to the end of this foot in a little bit short as well up to his knee. Otherwise things seem to be doing quite well. 04/21/19 upon evaluation today patient's wound bed actually showed evidence of being completely healed in regard to both lower extremities which is excellent news. There does not appear to be any signs of active infection which is also good news. I'm very pleased in this regard. No fevers, chills, nausea, or vomiting noted at this time. 04/28/19 on evaluation today patient appears to be doing a little bit worse in regard to both lower extremities on the left mainly due to the fact that when he went infection disease the wrap was not wrapped quite high enough he developed a blister above this. On the right he is a small open area of nothing too significant but again this is continuing to give him some trouble he has been were in the Velcro compression that he has at home. 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 He's been tolerating the dressing changes without complication. Fortunately there's no signs of active infection at this time. No fevers, chills, nausea, or vomiting noted at this time. We have been trying to get an appointment with her lymphedema clinic in [facility] but unfortunately nobody can get them on phone with not been able to even fax information over the patient likewise is not been able to get in touch with them. Overall I'm not sure exactly what's going on here with to reach out again today. 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 Still having a lot of drainage unfortunately. He tells  me especially in the left but even on the right. There's no signs of active infection which is good news we've been using so ratcheted up to this point. 05/19/19 on evaluation today patient actually appears to be doing quite well with regard to his left lower extremity which is great news. Fortunately in regard to the right lower extremity has an issues with his wrap and he subsequently did remove this from what I'm understanding. Nonetheless long story short is what he had rewrapped once he removed it subsequently had maggots underneath this wrap whenever he came in for evaluation today. With that being said they were obviously completely cleaned away by the nursing staff. The visit today which is excellent news. However he does appear to potentially have some infection around the right ankle region where the maggots were located as well. He will likely require anabiotic therapy today. 05/26/19 on evaluation today patient actually appears to be doing much better in regard to his bilateral lower extremities. I feel like the infection is under much better control. With that being said there were maggots noted when the wrap was removed yet again today. Again this could have potentially been left over from  previous although at this time there does not appear to be any signs of significant drainage there was obviously on the wrap some drainage as well this contracted gnats or otherwise. Either way I do not see anything that appears to be doing worse in my pinion and in fact I think his drainage has slowed down quite significantly likely mainly due to the fact to his infection being under better control. 06/02/2019 on evaluation today patient actually appears to be doing well with regard to his bilateral lower extremities there is no signs of active infection at this time which is great news. With that being said he does have several open areas more so on the right than the left but nonetheless these are all  significantly better than previously noted. 06/09/2019 on evaluation today patient actually appears to be doing well. His wrap stayed up and he did not cause any problems he had more drainage on the right compared to the left but overall I do not see any major issues at this time which is great news. 06/16/2019 on evaluation today patient appears to be doing excellent with regard to his lower extremities the only area that is open is a new blister that can have opened as of today on the medial ankle on the left. Other than this he really seems to be doing great I see no major issues at this point. 06/23/2019 on evaluation today patient appears to be doing quite well with regard to his bilateral lower extremities. In fact he actually appears to be almost completely healed there is a small area of weeping noted of the right lower extremity just above the ankle. Nonetheless fortunately there is no signs of active infection at this time which is good news. No fevers, chills, nausea, vomiting, or diarrhea. 8/24; the patient arrived for a nurse visit today but complained of very significant pain in the left leg and therefore I was asked to look at this. Noted that he did not have an open area on the left leg last week nevertheless this was wrapped. The patient states that he is not been able to put his compression pumps on the left leg because of the discomfort. He has not been systemically unwell 06/30/2019 on evaluation today patient unfortunately despite being excellent last week is doing much worse with regard to his left lower extremity today. In fact he had to come in for a nurse on Monday where his left leg had to be rewrapped due to excessive weeping Dr. Leanord Hawking placed him on doxycycline at that point. Fortunately there is no signs of active infection Systemically at this time which is good news. 07/07/2019 in regard to the patient's wounds today he actually seems to be doing well with his right lower  extremity there really is nothing open or draining at this point this is great news. Unfortunately the left lower extremity is given him additional trouble at this time. There does not appear to be any signs of active infection nonetheless he does have a lot of edema and swelling noted at this point as well as blistering all of which has led to a much more poor appearing leg at this time compared to where it was 2 weeks ago when it was almost completely healed. Obviously this is a little discouraging for the patient. He is try to contact the lymphedema clinic in [place] he has not been able to get through to them. 07/14/2019 on evaluation today patient actually appears to be doing  slightly better with regard to his left lower extremity ulcers. Overall I do feel like at least at the top of the wrap that we have been placing this area has healed quite nicely and looks much better. The remainder of the leg is showing signs of improvement. Unfortunately in the thigh area he still has an open region on the left and again on the right he has been utilizing just a Band-Aid on an area that also opened on the thigh. Again this is an area that were not able to wrap although we did do an Ace wrap to provide some compression that something that obviously is a little less effective than the compression wraps we have been using on the lower portion of the leg. He does have an appointment with the lymphedema clinic in [name] on Friday. 07/21/2019 on evaluation today patient appears to be doing better with regard to his lower extremity ulcers. He has been tolerating the dressing changes without complication. Fortunately there is no signs of active infection at this time. No fevers, chills, nausea, vomiting, or diarrhea. I did receive the paperwork from the physical therapist at the lymphedema clinic in [place]. Subsequently I signed off on that this morning and sent that back to him for  further progression with the treatment plan. 07/28/2019 on evaluation today patient appears to be doing very well with regard to his right lower extremity where I do not see any open wounds at this point. Fortunately he is feeling great as far as that is concerned as well. In regard to the left lower extremity he has been having issues with still several areas of weeping and edema although the upper leg is doing better his lower leg still I think is going require the compression wrap at this time. No fevers, chills, nausea, vomiting, or diarrhea. 08/04/2019 on evaluation today patient unfortunately is having new wounds on the right lower extremity. Again we have been using Unna boot wrap on that side. We switched him to using his juxta lite wrap at home. With that being said he tells me he has been using it although his legs extremely swollen and to be honest really does not appear that he has been. I cannot know that for sure however. Nonetheless he has multiple new wounds on the right lower extremity at this time. Obviously we will have to see about getting this rewrapped for him today. 08/11/2019 on evaluation today patient appears to be doing fairly well with regard to his wounds. He has been tolerating the dressing changes including the compression wraps without complication. He still has a lot of edema in his upper thigh regions bilaterally he is supposed to be seeing the lymphedema clinic on the 15th of this month once his wraps arrive for the upper part of his legs. 08/18/2019 on evaluation today patient appears to be doing well with regard to his bilateral lower extremities at this point. He has been tolerating the dressing changes without complication. Fortunately there is no signs of active infection which is also good news. He does have a couple weeping areas on the first and second toe of the right foot he also has just a small area on the left foot upper leg and a small area on the left  lower leg but overall he is doing quite well in my opinion. He is supposed to be getting his wraps shortly in fact tomorrow and then subsequently is seeing the lymphedema clinic next Wednesday on the 21st. Of  note he is also leaving on the 25th to go on vacation for a week to the beach. For that reason and since there is some uncertainty about what there can be doing at lymphedema clinic next Wednesday I am get a make an appointment for next Friday here for [place] to see what we need to do for him prior to him leaving for vacation. 10/23; patient arrives in considerable pain predominantly in the upper posterior calf just distal to the popliteal fossa also in the wound anteriorly above the major wound. This is probably cellulitis and he has had this recurrently in the past. He has no open wound on the right side and he has had an Radio broadcast assistant in that area. Finally I note that he has an area on the left posterior calf which by enlarge is mostly epithelialized. This protrudes beyond the borders of the surrounding skin in the setting of dry scaly skin and lymphedema. The patient is leaving for [facility] on Sunday. Per his longstanding pattern, he will not take his compression pumps with him predominantly out of fear that they will be stolen. He therefore asked that we put a Unna boot back on the right leg. He will also contact the wound care center in [facility] to see if they can change his dressing in the mid week. 11/3; patient returned from his vacation to [facility]. He was seen on 1 occasion at their wound care center. They did a 2 layer compression [name] as they did not have our 4-layer wrap. I am not completely certain what they put on the wounds. They did not change the Unna boot on the right. The patient is also seeing a lymphedema specialist physical therapist in [place]. It appears that he has some compression sleeve for his thighs which indeed look quite a bit better than I am used to  seeing. He pumps over these with his external compression pumps. 11/10; the patient has a new wound on the right medial thigh otherwise there is no open areas on the right. He has an area on the left leg posteriorly anteriorly and medially and an area over the left second toe. We have been using silver alginate. He thinks the injury on his thigh is secondary to friction from the compression sleeve he has. 11/17; the patient has a new wound on the right medial thigh last week. He thinks this is because he did not have a underlying stocking for his thigh juxta lite apparatus. He now has this. The area is fairly large and somewhat angry but I do not think he has underlying cellulitis. ooHe has a intact blister on the right anterior tibial area. ooSmall wound on the right great toe dorsally ooSmall area on the medial left calf. 11/30; the patient does not have any open areas on his right leg and we did not take his juxta lite stocking off. However he states that on Friday his compression wrap fell down lodging around his upper mid calf area. As usual this creates a lot of problems for him. He called urgently today to be seen for a nurse visit however the nurse visit turned into a provider visit because of extreme erythema and pain in the left anterior tibia extending laterally and posteriorly. The area that is problematic is extensive 10/06/2019 upon evaluation today patient actually appears to be doing poorly in regard to his left lower extremity. He Dr. Leanord Hawking did place him on doxycycline this past Monday apparently due to  the fact that he was doing much worse in regard to this left leg. Fortunately the doxycycline does seem to be helping. Unfortunately we are still having a very difficult time getting his edema under any type of control in order to anticipate discharge at some point. The only way were really able to control his lymphedema really is with compression wraps and that has only even  seemingly temporary. He has been seeing a lymphedema clinic they are trying to help in this regard but still this has been somewhat frustrating in general for the patient. 10/13/19 on evaluation today patient appears to be doing excellent with regard to his right lower extremity as far as the wounds are concerned. His swelling is still quite extensive unfortunately. He is still having a lot of drainage from the thigh areas bilaterally which is unfortunate. He's been going to lymphedema clinic but again he still really does not have this edema under control as far as his lower extremities are concern. With regard to his left lower extremity this seems to be improving and I do believe the doxycycline has been of benefit for him. He is about to complete the doxycycline. 10/20/2019 on evaluation today patient appears to be doing poorly in regard to his bilateral lower extremities. More in the right thigh he has a lot of irritation at this site unfortunately. In regard to the left lower extremity the wrap was not quite as high it appears and does seem to have caused him some trouble as well. Fortunately there is no evidence of systemic infection though he does have some blue-green drainage which has me concerned for the possibility of Pseudomonas. He tells me he is previously taking Cipro without complications and he really does not care for Levaquin however due to some of the side effects he has. He is not allergic to any medications specifically antibiotics that were aware of. 10/27/2019 on evaluation today patient actually does appear to be for the most part doing better when compared to last week's evaluation. With that being said he still has multiple open wounds over the bilateral lower extremities. He actually forgot to start taking the Cipro and states that he still has the whole bottle. He does have several new blisters on left lower extremity today I think I would recommend he go ahead and take the  Cipro based on what I am seeing at this point. 12/30-Patient comes at 1 week visit, 4 layer compression wraps on the left and Unna boot on the right, primary dressing Xtrasorb and silver alginate. Patient is taking his Cipro and has a few more days left probably 5-6, and the legs are doing better. He states he is using his compressions devices which I believe he has 11/10/2019 on evaluation today patient actually appears to be much better than last time I saw him 2 weeks ago. His wounds are significantly improved and overall I am very pleased in this regard. Fortunately there is no signs of active infection at this time. He is just a couple of days away from completing Cipro. Overall his edema is much better he has been using his lymphedema pumps which I think is also helping at this point. 11/17/2019 on evaluation today patient appears to be doing excellent in regard to his wounds in general. His legs are swollen but not nearly as much as they have been in the past. Fortunately he is tolerating the compression wraps without complication. No fevers, chills, nausea, vomiting, or diarrhea. He does have  some erythema however in the distal portion of his right lower extremity specifically around the forefoot and toes there is a little bit of warmth here as well. 11/24/2019 on evaluation today patient appears to be doing well with regard to his right lower extremity I really do not see any open wounds at this point. His left lower extremity does have several open areas and his right medial thigh also is open. Other than this however overall the patient seems to be making good progress and I am very pleased at this point. 12/01/2019 on evaluation today patient appears to be doing poorly at this point in regard to his left lower extremity has several new blisters despite the fact that we have him in compression wraps. In fact he had a 4-layer compression wrap, his upper thigh wrapped from lymphedema clinic, and a  juxta light over top of the 4 layer compression wrap the lymphedema clinic applied and despite all this he still develop blisters underneath. Obviously this does have me concerned about the fact that unfortunately despite what we are doing to try to get wounds healed he continues to have new areas arise I do not think he is ever good to be at the point where he can realistically just use wraps at home to keep things under control. Typically when we heal him it takes about 1-2 days before he is back in the clinic with severe breakdown and blistering of his lower extremities bilaterally. This is happened numerous times in the past. Unfortunately I think that we may need some help as far as overall fluid overload to kind of limit what we are seeing and get things under better control. 12/08/2019 on evaluation today patient presents for follow-up concerning his ongoing bilateral lower extremity edema. Unfortunately he is still having quite a bit of swelling the compression wraps are controlling this to some degree but he did see Dr. [name] his cardiologist I do have that available for review today as far as the appointment was concerned that was on 12/06/2019. Obviously that she has been 2 days ago. The patient states that he is only been taking the Lasix 80 mg 1 time a day he had told me previously he was taking this twice a day. Nonetheless Dr. [name] recommended this be up to 80 mg 2 times a day for the patient as he did appear to be fluid overloaded. With that being said the patient states he did this yesterday and he was unable to go anywhere or do anything due to the fact that he was constantly having to urinate. Nonetheless I think that this is still good to be something that is important for him as far as trying to get his edema under control at all things that he is going to be able to just expect his wounds to get under control and things to be better without going through at least a period of time  where he is trying to stabilize his fluid management in general and I think increasing the Lasix is likely the first step here. It was also mentioned the possibility that the patient may require metolazone. With that being said he wanted to have the patient take Lasix twice a day first and then reevaluating 2 months to see where things stand. 12/15/2019 upon evaluation today patient appears to be doing regard to his legs although his toes are showing some signs of weeping especially on the left at this point to some degree on the right.  There does not appear to be any signs of active infection and overall I do feel like the compression wraps are doing well for him but he has not been able to take the Lasix at home and the increased dose that Dr. [name] recommended. He tells me that just not go to be feasible for him. Nonetheless I think in this case he should probably send a message to Dr. [name] in order to discuss options from the standpoint of possible admission to get the fluid off or otherwise going forward. 12/22/2019 upon evaluation today patient appears to be doing fairly well with regard to his lower extremities at this point. In fact he would be doing excellent if it was not for the fact that his right anterior thigh apparently had an allergic reaction to adhesive tape that he used. The wound itself that we have been monitoring actually appears to be healed. There is a lot of irritation at this point. 12/29/2019 upon evaluation today patient appears to be doing well in regard to his lower extremities. His left medial thigh is open and somewhat draining today but this is the only region that is open the right has done much better with the treatment utilizing the steroid cream that I prescribed for him last week. Overall I am pleased in that regard. Fortunately there is no signs of active infection at this time. No fevers, chills, nausea, vomiting, or diarrhea. 01/05/2020 upon evaluation today patient  appears to be doing more poorly in regard to his right lower extremity at this point upon evaluation today. Unfortunately he continues to have issues in this regard and I think the biggest issue is controlling his edema. This obviously is not very well controlled at this point is been recommended that he use the Lasix twice a day but he has not been able to do that. Unfortunately I think this is leading to an issue where honestly he is not really able to effectively control his edema and therefore the wounds really are not doing significantly better. I do not think that he is going to be able to keep things under good control unless he is able to control his edema much better. I discussed this again in great detail with him today. 01/12/2020 good news is patient actually appears to be doing quite well today at this point. He does have an appointment with lymphedema clinic tomorrow. His legs appear healed and the toe on the left is almost completely healed. In general I am very pleased with how things stand at this point. 01/19/2020 upon evaluation today patient appears to actually be doing well in regard to his lower extremities there is nothing open at this point. Fortunately he has done extremely well more recently. Has been seeing lymphedema clinic as well. With that being said he has Velcro wraps for his lower legs as well as his upper legs. The only wound really is on his toe which is the right great toe and this is barely anything even there. With all that being said I think it is good to be appropriate today to go ahead and switch him over to the Velcro compression wraps. 01/26/2020 upon evaluation today patient appears to be doing worse with regard to his lower extremities after last week switch him to Velcro compression wraps. Unfortunately he lasted less than 24 hours he did not have the sock portion of his Velcro wrap on the left leg and subsequently developed a blister underneath the Velcro  portion. Obviously  this is not good and not what we were looking for at this point. He states the lymphedema clinic did tell him to wear the wrap for 23 hours and take him off for 1 I am okay with that plan but again right now we got a get things back under control again he may have some cellulitis noted as well. 02/02/2020 upon evaluation today patient unfortunately appears to have several areas of blistering on his bilateral lower extremities today mainly on the feet. His legs do seem to be doing somewhat better which is good news. Fortunately there is no evidence of active infection at this time. No fevers, chills, nausea, vomiting, or diarrhea. 02/16/2020 upon evaluation today patient appears to be doing well at this time with regard to his legs. He has a couple weeping areas on his toes but for the most part everything is doing better and does appear to be sealed up on his legs which is excellent news. We can continue with wrapping him at this point as he had every time we discontinue the wraps he just breaks out with new wounds. There is really no point in is going forward with this at this point. 03/08/2020 upon evaluation today patient actually appears to be doing quite well with regard to his lower extremity ulcers. He has just a very superficial and really almost nonexistent blister on the left lower extremity he has in general done very well with the compression wraps. With that being said I do not see any signs of infection at this time which is good news. 03/29/2020 upon evaluation today patient appears to be doing well with regard to his wounds currently except for where he had several new areas that opened up due to some of the wrap slipping and causing him trouble. He states he did not realize they had slipped. Nonetheless he has a 1 area on the right and 3 new areas on the left. Fortunately there is no signs of active infection at this time which is great news. 04/05/2020 upon evaluation  today patient actually appears to be doing quite well in general in regard to his legs currently. Fortunately there is no signs of active infection at this time. No fevers, chills, nausea, vomiting, or diarrhea. He tells me next week that he will actually be seen in the lymphedema clinic on Thursday at 10 AM I see him on Wednesday next week. 04/12/2020 upon evaluation today patient appears to be doing very well with regard to his lower extremities bilaterally. In fact he does not appear to have any open wounds at this point which is good news. Fortunately there is no signs of active infection at this time. No fevers, chills, nausea, vomiting, or diarrhea. 04/19/2020 upon evaluation today patient appears to be doing well with regard to his wounds currently on the bilateral lower extremities. There does not appear to be any signs of active infection at this time. Fortunately there is no evidence of systemic infection and overall very pleased at this point. Nonetheless after I held him out last week he literally had blisters the next morning already which swelled up with him being right back here in the clinic. Overall I think that he is just not can be able to be discharged with his legs the way they are he is much to volume overloaded as far as fluid is concerned and that was discussed with him today of also discussed this but should try the clinic nurse manager as well as  Dr. Leanord Hawking. 04/26/2020 upon evaluation today patient appears to be doing better with regard to his wounds currently. He is making some progress and overall swelling is under good control with the compression wraps. Fortunately there is no evidence of active infection at this time. 05/10/2020 on evaluation today patient appears to be doing overall well in regard to his lower extremities bilaterally. He is Tolerating the compression wraps without complication and with what we are seeing currently I feel like that he is making excellent  progress. There is no signs of active infection at this time. 05/24/2020 upon evaluation today patient appears to be doing well in regard to his legs. The swelling is actually quite a bit down compared to where it has been in the past. Fortunately there is no sign of active infection at this time which is also good news. With that being said he does have several wounds on his toes that have opened up at this point. 05/31/2020 upon evaluation today patient appears to be doing well with regard to his legs bilaterally where he really has no significant fluid buildup at this point overall he seems to be doing quite well. Very pleased in this regard. With regard to his toes these also seem to be drying up which is excellent. We have continue to wrap him as every time we tried as a transition to the juxta light wraps things just do not seem to get any better. 06/07/2020 upon evaluation today patient appears to be doing well with regard to his right leg at this point. Unfortunately left leg has a lot of blistering he tells me the wrap started to slide down on him when he tried to put his other Velcro wrap over top of it to help keep things in order but nonetheless still had some issues. 06/14/2020 on evaluation today patient appears to be doing well with regard to his lower extremity ulcers and foot ulcers at this point. I feel like everything is actually showing signs of improvement which is great news overall there is no signs of active infection at this time. No fevers, chills, nausea, vomiting, or diarrhea. 06/21/2020 on evaluation today patient actually appears to be doing okay in regard to his wounds in general. With that being said the biggest issue I see is on his right foot in particular the first and second toe seem to be doing a little worse due to the fact this is staying very wet. I think he is probably getting need to change out his dressings a couple times in between each week when we see him in  regard to his toes in order to keep this drier based on the location and how this is proceeding. 06/28/2020 on evaluation today patient appears to be doing a little bit more poorly overall in regard to the appearance of the skin I am actually somewhat concerned about the possibility of him having a little bit of an infection here. We discussed the course of potentially giving him a doxycycline prescription which he is taken previously with good result. With that being said I do believe that this is potentially mild and at this point easily fixed. I just do not want anything to get any worse. 07/12/2020 upon evaluation today patient actually appears to be making some progress with regard to his legs which is great news there does not appear to be any evidence of active infection. Overall very pleased with where things stand. 07/26/2020 upon evaluation today patient appears to be  doing well with regard to his leg ulcers and toe ulcers at this point. He has been tolerating the compression wraps without complication overall very pleased in this regard. 08/09/2020 upon evaluation today patient appears to be doing well with regard to his lower extremities bilaterally. Fortunately there is no signs of active infection overall I am pleased with where things stand. 08/23/2020 on evaluation today patient appears to be doing well with regard to his wound. He has been tolerating the dressing changes without complication. Fortunately there is no signs of active infection at this time. Overall his legs seem to be doing quite well which is great news and I am very pleased in that regard. No fevers, chills, nausea, vomiting, or diarrhea. 09/13/2020 upon evaluation today patient appears to be doing okay in regard to his lower extremities. He does have a fairly large blister on the right leg which I did remove the blister tissue from today so we can get this to dry out other than that however he seems to be doing quite  well. There is no signs of active infection at this time. 09/27/2020 upon evaluation today patient appears to actually be doing some better in regard to his right leg. Fortunately signs of active infection at this time which is great news. No fevers, chills, nausea, vomiting, or diarrhea. 10/04/2020 upon evaluation today patient actually appears to be showing signs of improvement which is great news with regard to his leg ulcers. Fortunately there is no signs of active infection which is great news he is still taking the antibiotics currently. No fevers, chills, nausea, vomiting, or diarrhea. 10/18/2020 on evaluation today patient appears to be doing well with regard to his legs currently. He has been tolerating the dressing changes including the wraps without complication. Fortunately there is no signs of active infection at this time. No fevers, chills, nausea, vomiting, or diarrhea. 10/25/2020 upon evaluation today patient appears to be doing decently well in regard to his wounds currently. He has been tolerating the dressing changes without complication. Overall I feel like he is making good progress albeit slow. Again this is something we can have to continue to wrap for some time to come most likely. 11/08/2020 upon evaluation today patient appears to be doing well with regard to his wounds currently. He has been tolerating the dressing changes without complication is not currently on any antibiotics and he does not appear to show any signs of infection. He does continue to have a lot of drainage on the right leg not too severe but nonetheless this is very scattered. On the left leg this is looking to be much improved overall. 11/15/2020 upon evaluation today patient appears to be doing better with regard to his legs bilaterally. Especially the right leg which was much more significant last week. There does not appear to be any signs of active infection which is great news. No fevers, chills,  nausea, vomiting, or diarrhea. 11/23/2019 upon evaluation today patient appears to be doing poorly still in regard to his lower extremities bilaterally. Unfortunately his right leg in particular appears to be doing much more poorly there is no signs really of infection this is not warm to touch but he does have a lot of drainage and weeping unfortunately. With that reason I do believe that we may need to initiate some treatment here to try to help calm down some of the swelling of the right leg. I think switching to a 4-layer compression wrap would be beneficial here.  The patient is in agreement with giving this a try. 11/29/2020 upon evaluation today patient appears to be doing well currently in regard to his leg ulcers. I feel like the right leg is doing better he still has a lot of drainage but we do see some improvement here. The 4-layer compression wrap I think was helpful. 12/06/2020 upon evaluation today patient appears to be doing well with regard to his legs. In fact they seem to be doing about the best I have seen up to this point. Fortunately there is no signs of active infection at this time. No fevers, chills, nausea, vomiting, or diarrhea. 12/20/2020 upon evaluation today patient appears to be doing well at this time in regard to his legs. He is not having any significant draining which is great news. Fortunately there is no signs of active infection at this time. No fevers, chills, nausea, vomiting, or diarrhea. 01/17/2021 upon evaluation today Ison actually appears to be doing excellent in regard to his legs. He has a few areas again that come and go as far as his toes are concerned but overall this is doing quite well. 01/31/2021 upon evaluation today patient appears to be doing well with regard to his legs. Fortunately there does not appear to be any signs of active infection which is great news. Overall he is still having significant edema despite the compression wraps basically the  4-layer compression wrap to just keep things under control there is really not much room for play. 4/13: Mr. Sawyers is a longstanding patient in our clinic and benefits greatly from weekly compression wraps. Today he has no complaints. He has been tolerating the wraps well. He states he is using the lymphedema pumps at home. 5/4; patient presents for follow-up of his chronic lymphedema/venous insufficiency ulcers. He comes weekly for compression wraps. He has no complaints today. He was unable to tolerate the Coflex 2 layer Last week so we will do the four press 4-layer compression. He has been using his lymphedema pumps daily. 5/18; patient presents for 2-week follow-up. He has no complaints or issues today. He has developed a new wound to the right foot on his fourth toe. He overall feels well and denies signs of infection. 6/1; patient presents for 2-week follow-up. He has no complaints or issues today. He denies signs of infection. 04/18/2021 upon evaluation today patient appears to be doing well with regard to his legs bilaterally. Family open wound is actually on the toe of his left foot everything else is completely closed which is great news. In general I am extremely pleased with where things stand at this point. The patient is also happy that things are doing so well. 05/02/2021 upon evaluation today patient's legs actually appear to be doing quite well today. Fortunately there does not appear to be any signs of active infection which is great and overall I am extremely pleased with where he stands today. The patient does not appear to have any evidence of active infection at this time which is also great news. 05/09/2021 upon evaluation today patient appears to be doing a little bit more poorly in regard to his legs. Unfortunately he is having issues with some breakdown and a blood blister on the left leg this is due to I believe honestly to how it was wrapped last week. Fortunately there  does not appear to be any signs of infection but nonetheless this is still a concern to be honest. No fevers, chills, nausea, vomiting, or diarrhea. 05/16/2021  upon evaluation today patient appears to be doing significantly better as compared to last week. I am very pleased with where things stand today. There does not appear to be any signs of infection which is great news and overall very pleased with where we stand. No fevers, chills, nausea, vomiting, or diarrhea. 05/30/2021 upon evaluation today patient appears to be doing well with regard to his legs. He has been tolerating the dressing changes without complication. Fortunately there does not appear to be any signs of active infection which is great news and overall I am extremely pleased with where things stand today. No fevers, chills, nausea, vomiting, or diarrhea. 06/20/2021 upon evaluation today patient actually appears to be making good progress today and very pleased with what we are seeing. I think his legs are really maintaining. As long as we continue wrapping he seems to be doing excellent in my opinion. Fortunately there is no signs of active infection at this time. No fevers, chills, nausea, vomiting, or diarrhea. 07/11/2021 upon evaluation today patient actually appears to be making excellent progress at this time. Fortunately there does not appear to be any evidence of active infection which is great news and overall I am extremely pleased with where things stand today. No fevers, chills, nausea, vomiting, or diarrhea. 07/25/2021 upon evaluation today patient appears to be doing well currently in regard to his lower extremities. He has been making good progress here and I do not see anything that is actually open significantly today this is great news. No fevers, chills, nausea, vomiting, or diarrhea. Objective Constitutional Well-nourished and well-hydrated in no acute distress. Vitals Time Taken: 11:04 AM, Height: 70 in, Weight:  380.2 lbs, BMI: 54.5, Temperature: 97.8 F, Pulse: 81 bpm, Respiratory Rate: 18 breaths/min, Blood Pressure: 161/68 mmHg. Respiratory normal breathing without difficulty. Psychiatric this patient is able to make decisions and demonstrates good insight into disease process. Alert and Oriented x 3. pleasant and cooperative. General Notes: Upon inspection patient's wound bed actually showed signs of complete epithelization in regard to the toe everything else is doing well his skin actually appears to be intact and doing quite nicely today all things considered. Integumentary (Hair, Skin) Wound #193 status is Healed - Epithelialized. Original cause of wound was Gradually Appeared. The date acquired was: 01/31/2021. The wound has been in treatment 25 weeks. The wound is located on the Left T Second. The wound measures 0cm length x 0cm width x 0cm depth; 0cm^2 area and 0cm^3 volume. oe There is Fat Layer (Subcutaneous Tissue) exposed. There is a small amount of serous drainage noted. The wound margin is distinct with the outline attached to the wound base. There is large (67-100%) pink, pale granulation within the wound bed. There is no necrotic tissue within the wound bed. 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 Post procedure Diagnosis Wound #: Same as Pre-Procedure There was a Four Layer Compression Therapy Procedure by Shawn Stall, RN. Post procedure Diagnosis Wound #: Same as Pre-Procedure Plan Follow-up Appointments: Return appointment in 3  weeks. - with Leonard Schwartz Nurse Visit: - one week 9/28 and 10/5 Bathing/ Shower/ Hygiene: May shower with protection but do not get wound dressing(s) wet. Edema Control - Lymphedema / SCD / Other: Lymphedema Pumps. Use Lymphedema pumps on leg(s) 2-3 times a day for 45-60 minutes. If wearing any wraps or hose, do not remove them. Continue exercising as instructed. Elevate legs to the level of the heart or above for 30 minutes daily and/or when sitting, a frequency of: - throughout the day Avoid standing for long periods of time. Exercise regularly Non Wound Condition: Other Non Wound Condition Orders/Instructions: - lotion to both legs, 4 layer compression wraps both legs, silver alginate to any weeping areas 1. Would recommend currently that we going continue with the wound care measures as before and the patient is in agreement the plan this includes the use of the compression wrapping bilaterally. This is a 4-layer compression wrap and seems to be doing a great job. 2. I am also can recommend currently that we have the patient continue to monitor for any signs of worsening or infection. Obviously if he has any significant issues he should let me know soon as possible. 3. I am also can recommend that we have the patient continue to elevate his legs is much as possible try to help with edema control. We will see patient back for reevaluation in 1 week here in the clinic. If anything worsens or changes patient will contact our office for additional recommendations. Electronic Signature(s) Signed: 07/25/2021 12:38:54 PM By: Lenda Kelp PA-C Entered By: Lenda Kelp on 07/25/2021 12:38:54 -------------------------------------------------------------------------------- SuperBill Details Patient Name: Date of Service: [name], [name] 07/25/2021 Medical Record Number:  [id number] Patient Account Number: [id number] Date of Birth/Sex: Treating RN: 09-13-1951 (70 y.o. [name] Primary Care Provider: Nicoletta Ba Other Clinician: Referring Provider: Treating Provider/Extender: Adele Dan in Treatment: 287 Diagnosis Coding ICD-10 Codes Code Description [phone] Non-pressure chronic ulcer of other 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 of skin L97.829 Non-pressure chronic ulcer of other part of left lower leg with unspecified severity L97.519 Non-pressure chronic ulcer of other part of right foot with unspecified severity I87.333 Chronic venous hypertension (idiopathic) with ulcer and inflammation of bilateral lower extremity I89.0 Lymphedema, not elsewhere classified E11.622 Type 2 diabetes mellitus with other skin ulcer E11.40 Type 2 diabetes mellitus with diabetic neuropathy, unspecified Facility Procedures CPT4: Code [id number] Description: 81 BILATERAL: Application of multi-layer venous compression [name]; leg (below knee), including ankle and t. Modifier: Quantity: 1 Physician Procedures : CPT4 Code Description Modifier [id number] - WC PHYS LEVEL 3 - EST PT ICD-10 Diagnosis Description I89.0 Lymphedema, not elsewhere classified I87.333 Chronic venous hypertension (idiopathic) with ulcer and inflammation of bilateral lower extremity  E11.40 Type 2 diabetes mellitus with diabetic neuropathy, unspecified Quantity: 1 Electronic Signature(s) Signed: 07/25/2021 5:43:52 PM By: Zenaida Deed RN, BSN Signed: 07/26/2021 2:21:00 PM By: Lenda Kelp PA-C Previous Signature: 07/25/2021 12:47:02 PM Version By: Lenda Kelp PA-C Entered By: Zenaida Deed on 07/25/2021 15:45:37

## 2021-07-26 ENCOUNTER — Ambulatory Visit (INDEPENDENT_AMBULATORY_CARE_PROVIDER_SITE_OTHER): Payer: Medicare Other | Admitting: Family Medicine

## 2021-07-26 ENCOUNTER — Encounter: Payer: Self-pay | Admitting: Family Medicine

## 2021-07-26 VITALS — BP 147/53 | HR 56 | Temp 98.0°F | Resp 16 | Ht 70.0 in | Wt 380.0 lb

## 2021-07-26 DIAGNOSIS — I10 Essential (primary) hypertension: Secondary | ICD-10-CM

## 2021-07-26 DIAGNOSIS — I482 Chronic atrial fibrillation, unspecified: Secondary | ICD-10-CM | POA: Diagnosis not present

## 2021-07-26 DIAGNOSIS — N183 Chronic kidney disease, stage 3 unspecified: Secondary | ICD-10-CM

## 2021-07-26 DIAGNOSIS — N2889 Other specified disorders of kidney and ureter: Secondary | ICD-10-CM

## 2021-07-26 MED ORDER — HYDROCODONE-ACETAMINOPHEN 5-325 MG PO TABS: ORAL_TABLET | ORAL | 0 refills | Status: DC

## 2021-07-26 MED ORDER — AMLODIPINE BESYLATE 10 MG PO TABS: 10.0000 mg | ORAL_TABLET | Freq: Every day | ORAL | 0 refills | Status: DC

## 2021-07-26 NOTE — Patient Instructions (Signed)
Increase clonidine by 1 tab at each dose if bp remains >150 on top or >90 on bottom.  (0.3mg  twice a day is max)  We hope to eventually get your blood pressure consistently around 130/80.

## 2021-07-26 NOTE — Progress Notes (Signed)
OFFICE VISIT  07/26/2021  CC:  Chief Complaint  Patient presents with   Follow-up    Hypertension and HR    HPI:    Patient is a 70 y.o. Caucasian male who presents for 1 wk f/u uncontrolled HTN with bradycardia and a-fib. A/P as of last visit: "1) Uncontrolled HTN + bradycardia in the setting of chronic a-fib. Stop toprol. Dec dilt xr to 120 qd. Cont clonidine 0.1 tid and lisinopril 40 qd. After I get HR up some consistently then will work on bp control."  INTERIM HX: Feeling well, no acute complaints.  Home bp's: 161-212 syst, 83-114 diast, HR 58-82.  He requests RF of his vicodin, not out of this med yet but wants to make sure rx available at pharmacy when needs to RF pretty soon.  PMP AWARE reviewed today: most recent rx for vicodin was filled 06/25/21, # 60, rx by me. No red flags.  Past Medical History:  Diagnosis Date   ALLERGIC RHINITIS 08/11/2006   ASTHMA 08/11/2006   Chronic atrial fibrillation (HCC) 08/2014   Chronic combined systolic and diastolic CHF (congestive heart failure) [facility])    Cardiology f/u 12/2017: pt volume overloaded (R heart dysf suspected), BNP very high, lasix increased.  Repeat echo 12/2017: normal LV EF, mild DD, +RV syst dysfxn, mod pulm HTN, biatrial enlgmt.   Chronic constipation    Chronic renal insufficiency, stage 2 (mild)    Borderline stage III (GFR 60s).   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  Plan repeat CBC at f/u summer 2019 and if decreased from baseline will check hemoccults and iron labs again.   OBESITY, MORBID 12/14/2007   saxenda started 05/2020 by [facility] wt mgmt center   OSA (obstructive sleep apnea)    to get  sleep study with [facility]) as of 12/03/2018 consult.   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  [id number] ID started him on amoxil prophylaxis---ineffective.  End 2018/Jan 2019 penicillin G injections prophyl helpful but pt declined to continue this as of 01/2018 ID f/u.  ID talked him into resuming monthly penicillin G as of 02/2018 f/u.     Restless leg syndrome    Rx'd clonazepam 09/2017 and pt refused to take it after reading the medication's potential side effects.   Venous stasis ulcers of both lower extremities (HCC)    Severe lymphedema.  wound clinic care ongoing as of 01/2018    Past Surgical History:  Procedure Laterality Date   Carotid dopplers  07/23/2018   Left NORMAL.  Right 1-39% ICA stenosis, with <50% distal CCA stenosis (not hemodynamically significant)   TRANSTHORACIC ECHOCARDIOGRAM  11/2007; 09/2014; 11/2015;12/2017   LV fxn normal, EF normal, mild dilation of left atrium.  2015 grade II diast dysfxn.  2017 EF 55-60%. 2019: LVEF 60-65%, mild RV syst dysf,biatrial enlgmt, mod pulm htn.   URETERAL STENT PLACEMENT     virtual colonoscopy  01/2011   Normal    Outpatient Medications Prior to Visit  Medication Sig Dispense Refill   arginine 500 MG tablet Take by mouth.     b complex vitamins capsule  Take 1 capsule by mouth every morning.     butalbital-acetaminophen-caffeine (FIORICET WITH CODEINE) 50-325-40-30 MG capsule TAKE 1 TO 2 CAPSULES BY  MOUTH EVERY 6 HOURS AS  NEEDED FOR HEADACHE(S)  MANUFACTURER RECOMMENDS NOT EXCEEDING 6 CAPSULES/DAY 60 capsule 0   cetirizine (ZYRTEC) 10 MG tablet Take 10 mg by mouth daily.     CHROMIUM GTF PO Take by mouth daily.     cloNIDine (CATAPRES) 0.1 MG tablet 1 tab po tid.  Take an extra tab as needed for bp > 180 on top or >100 on bottom (Patient taking differently: Take 0.1 mg by mouth 2 (two) times daily. 1 tab po tid.  Take an extra tab as needed for bp >  180 on top or >100 on bottom) 90 tablet 3   Coenzyme Q10 (CO Q 10) 10 MG CAPS Take by mouth. Reported on 02/19/2016     diltiazem (TIAZAC) 120 MG 24 hr capsule Take 1 capsule (120 mg total) by mouth daily. 30 capsule 0   furosemide (LASIX) 40 MG tablet TAKE 2 TABLETS BY MOUTH  TWICE DAILY 360 tablet 1   Glutamine 500 MG CAPS Take by mouth.     Insulin Lispro Prot & Lispro (HUMALOG MIX 75/25 KWIKPEN) (75-25) 100 UNIT/ML Kwikpen INJECT SUBCUTANEOUSLY 25  UNITS EVERY MORNING AND 20  UNITS EVERY EVENING AT  SUPPER 45 mL 3   Insulin Pen Needle (B-D ULTRAFINE III SHORT PEN) 31G X 8 MM MISC USE 1 PENNEEDLE TWICE DAILY 200 each 1   lisinopril (ZESTRIL) 40 MG tablet TAKE 1 TABLET BY MOUTH  DAILY 90 tablet 3   metFORMIN (GLUCOPHAGE) 1000 MG tablet TAKE 1 TABLET BY MOUTH  TWICE DAILY WITH MEALS 180 tablet 1   Multiple Vitamin (MULTIVITAMIN) tablet Take 1 tablet by mouth daily.     ONETOUCH ULTRA test strip CHECK BLOOD SUGAR TWICE  DAILY 200 strip 3   rivaroxaban (XARELTO) 20 MG TABS tablet TAKE 1 TABLET BY MOUTH ONCE DAILY WITH SUPPER 90 tablet 3   terazosin (HYTRIN) 10 MG capsule TAKE 1 CAPSULE BY MOUTH  ONCE DAILY AT BEDTIME 90 capsule 3   vitamin E 400 UNIT capsule Take 400 Units by mouth every morning. Selenium 50mg      HYDROcodone-acetaminophen (NORCO/VICODIN) 5-325 MG tablet 1-2 tabs po bid prn pain 60 tablet 0   Facility-Administered Medications Prior to Visit  Medication Dose Route Frequency Provider Last Rate Last Admin   penicillin g benzathine (BICILLIN LA) 1200000 UNIT/2ML injection 600,000 Units  600,000 Units Intramuscular Q30 days , MD   600,000 Units at 04/12/20 0933   penicillin g benzathine (BICILLIN LA) 1200000 UNIT/2ML injection 600,000 Units  600,000 Units Intramuscular Q30 days 06/12/20, MD   600,000 Units at 04/04/21 0814   penicillin g benzathine (BICILLIN LA) 1200000 UNIT/2ML injection 600,000 Units  600,000 Units Intramuscular Q30 days 06/04/21, MD    600,000 Units at 06/13/21 1148   penicillin g benzathine (BICILLIN LA) 1200000 UNIT/2ML injection 600,000 Units  600,000 Units Intramuscular Q30 days 08/13/21, MD   600,000 Units at 06/13/21 1146    Allergies  Allergen Reactions   Other Other (See Comments)    Sneezing, coughing   Hydralazine Other (See Comments)    Drowsiness/sedation/mental fog    ROS As per HPI  PE: Vitals with BMI 07/26/2021 07/18/2021 07/11/2021  Height 5\' 10"  5\' 10"  -  Weight 380 lbs 381 lbs 374 lbs  BMI 54.52 54.67 53.66  Systolic  147 94 137  Diastolic 53 44 69  Pulse 56 46 61     Gen: Alert, well appearing.  Patient is oriented to person, place, time, and situation. AFFECT: pleasant, lucid thought and speech. CV: irreg irreg, rate 60s, no m/r/g Chest is clear, no wheezing or rales. Normal symmetric air entry throughout both lung fields. No chest wall deformities or tenderness.   LABS:    Chemistry      Component Value Date/Time   NA 138 07/11/2021 0844   NA 141 02/09/2020 1453   K 4.4 07/11/2021 0844   CL 99 07/11/2021 0844   CO2 27 07/11/2021 0844   BUN 25 (H) 07/11/2021 0844   BUN 20 02/09/2020 1453   CREATININE 1.51 (H) 07/11/2021 0844   CREATININE 1.57 (H) 04/04/2021 0844      Component Value Date/Time   CALCIUM 8.6 07/11/2021 0844   ALKPHOS 70 07/12/2020 1015   AST 15 04/04/2021 0844   ALT 11 04/04/2021 0844   BILITOT 1.0 04/04/2021 0844     Lab Results  Component Value Date   HGBA1C 7.9 (H) 07/11/2021    IMPRESSION AND PLAN:  1) Uncontrolled HTN in the setting of a-fib with bradycardia. D/c of metoprolol and cutting back on dose of diltiazem has helped bring HR up to acceptable range. Will add amlodipine for bp control.  Therapeutic expectations and side effect profile of medication discussed today.  Patient's questions answered. Titrate clonidine from 0.1mg  bid up to max of 0.3 bid if bp remains >150/90. Cont lisinopril 40 qd.  Pt with CRI III; most recent sCr  stable at 1.5.   An After Visit Summary was printed and given to the patient.  FOLLOW UP: Return in about 2 weeks (around 08/09/2021) for f/u bp/hr.  Signed:  Santiago Bumpers, MD           07/26/2021

## 2021-07-27 ENCOUNTER — Ambulatory Visit: Payer: Medicare Other | Admitting: Family Medicine

## 2021-07-31 NOTE — Progress Notes (Signed)
ROC, STREETT ([id number]) Visit Report for 07/25/2021 Arrival Information Details Patient Name: Date of Service: [name], [name] 07/25/2021 10:30 A M Medical Record Number: [id number] Patient Account Number: [id number] Date of Birth/Sex: Treating [name]: 11-Jul-1951 (70 y.o. [name], [name] Primary Care [name]: [name] Other Clinician: Referring [name]: Treating [name]/Extender: [name] in Treatment: 287 Visit Information History Since Last Visit Added or deleted any medications: No Patient Arrived: [name] Any new allergies or adverse reactions: No Arrival Time: 11:04 Had a fall or experienced change in No Accompanied By: self activities of daily living that may affect Transfer Assistance: None risk of falls: Patient Identification Verified: Yes Signs or symptoms of abuse/neglect since last visito No Secondary Verification Process Completed: Yes Hospitalized since last visit: No Patient Requires Transmission-Based Precautions: No Implantable device outside of the clinic excluding No Patient Has Alerts: Yes cellular tissue based products placed in the [name] since last visit: Has Dressing in Place as Prescribed: Yes Pain Present Now: No Electronic Signature(s) Signed: 07/31/2021 12:59:59 PM By: Sandre Kitty Entered By: Sandre Kitty on 07/25/2021 11:04:35 -------------------------------------------------------------------------------- Compression Therapy Details Patient Name: Date of Service: [name], [name] 07/25/2021 10:30 A M Medical Record Number: [id number] Patient Account Number: [id number] Date of Birth/Sex: Treating [name]: 1951-06-04 (70 y.o. [name] Primary Care [name]: [name] Other Clinician: Referring [name]: Treating [name]/Extender: [name] in Treatment: 287 Compression Therapy Performed for Wound Assessment: NonWound Condition Lymphedema - Right  Leg Performed By: Clinician Kevin Pilling, [name] Compression Type: Four Layer Post Procedure Diagnosis Same as Pre-procedure Electronic Signature(s) Signed: 07/25/2021 5:43:52 PM By: Baruch Gouty [name], BSN Entered By: Baruch Gouty on 07/25/2021 11:49:14 -------------------------------------------------------------------------------- Compression Therapy Details Patient Name: Date of Service: [name], [name] 07/25/2021 10:30 A M Medical Record Number: [id number] Patient Account Number: [id number] Date of Birth/Sex: Treating [name]: 1951-09-01 (70 y.o. [name] Primary Care Nomi Rudnicki: [name] Other Clinician: Referring [name]: Treating [name]/Extender: [name] in Treatment: 287 Compression Therapy Performed for Wound Assessment: NonWound Condition Lymphedema - Left Leg Performed By: Clinician Kevin Pilling, [name] Compression Type: Four Layer Post Procedure Diagnosis Same as Pre-procedure Electronic Signature(s) Signed: 07/25/2021 5:43:52 PM By: Baruch Gouty [name], BSN Entered By: Baruch Gouty on 07/25/2021 11:49:38 -------------------------------------------------------------------------------- Encounter Discharge Information Details Patient Name: Date of Service: [name], [name] 07/25/2021 10:30 A M Medical Record Number: [id number] Patient Account Number: [id number] Date of Birth/Sex: Treating [name]: [name] (70 y.o. [name] Primary Care [name]: [name] Other Clinician: Referring [name]: Treating [name]/Extender: [name] in Treatment: 287 Encounter Discharge Information Items Discharge Condition: Stable Ambulatory Status: Walker Discharge Destination: Home Transportation: Private Auto Accompanied By: self Schedule Follow-up Appointment: Yes Clinical Summary of Care: Patient Declined Electronic Signature(s) Signed: 07/25/2021 5:43:52 PM By: Baruch Gouty [name],  BSN Entered By: Baruch Gouty on 07/25/2021 15:48:10 -------------------------------------------------------------------------------- Lower Extremity Assessment Details Patient Name: Date of Service: [name], [name] 07/25/2021 10:30 A M Medical Record Number: [id number] Patient Account Number: [id number] Date of Birth/Sex: Treating [name]: [name] (70 y.o. [name] Primary Care [name]: [name] Other Clinician: Referring [name]: Treating [name]/Extender: [name] in Treatment: 287 Edema Assessment Assessed: [name]: Yes] [name]: Yes] Edema: [Left: Yes] [Right: Yes] Calf Left: Right: Point of Measurement: 25 cm From Medial Instep 38 cm 38 cm Ankle Left: Right: Point of Measurement: 9 cm From Medial Instep 28 cm 26.5 cm Electronic  Signature(s) Signed: 07/25/2021 5:58:14 PM By: [name], BSN Entered By: [name] on 07/25/2021 11:12:11 -------------------------------------------------------------------------------- Unity Details Patient Name: Date of Service: [name], [name] 07/25/2021 10:30 A M Medical Record Number: [id number] Patient Account Number: [id number] Date of Birth/Sex: Treating [name]: 04-Jun-1951 (70 y.o. [name] Primary Care Lela Murfin: [name] Other Clinician: Referring Grantham Hippert: Treating Latausha Flamm/Extender: [name] in Treatment: Free Soil reviewed with physician Active Inactive Venous Leg Ulcer Nursing Diagnoses: Actual venous Insuffiency (use after diagnosis is confirmed) Goals: Patient will maintain optimal edema control Date Initiated: 09/10/2016 Target Resolution Date: 08/03/2021 Goal Status: Active Verify adequate tissue perfusion prior to therapeutic compression application Date Initiated: 09/10/2016 Date Inactivated: 11/28/2016 Goal Status: Met Interventions: Assess peripheral edema status every  visit. Compression as ordered Provide education on venous insufficiency Notes: Electronic Signature(s) Signed: 07/25/2021 5:43:52 PM By: Baruch Gouty [name], BSN Entered By: Baruch Gouty on 07/25/2021 15:44:39 -------------------------------------------------------------------------------- Pain Assessment Details Patient Name: Date of Service: [name], [name] 07/25/2021 10:30 A M Medical Record Number: [id number] Patient Account Number: [id number] Date of Birth/Sex: Treating [name]: 03-08-1951 (70 y.o. [name] Primary Care Teddy Rebstock: [name] Other Clinician: Referring Starletta Houchin: Treating Fran Neiswonger/Extender: [name] in Treatment: 287 Active Problems Location of Pain Severity and Description of Pain Patient Has Paino No Site Locations Pain Management and Medication Current Pain Management: Electronic Signature(s) Signed: 07/25/2021 5:43:52 PM By: Baruch Gouty [name], BSN Signed: 07/31/2021 12:59:59 PM By: Sandre Kitty Entered By: Sandre Kitty on 07/25/2021 11:05:07 -------------------------------------------------------------------------------- Patient/Caregiver Education Details Patient Name: Date of Service: [name], [name] 9/21/2022andnbsp10:30 [place] Record Number: [id number] Patient Account Number: [id number] Date of Birth/Gender: Treating [name]: 1951-09-13 (70 y.o. [name] Primary Care Physician: [name] Other Clinician: Referring Physician: Treating Physician/Extender: [name] in Treatment: [phone] Education Assessment Education Provided To: Patient Education Topics Provided Venous: Electronic Signature(s) Signed: 07/25/2021 5:43:52 PM By: Baruch Gouty [name], BSN Entered By: Baruch Gouty on 07/25/2021 15:45:11 -------------------------------------------------------------------------------- Wound Assessment Details Patient Name: Date of Service: [name], Kevin  J. 07/25/2021 10:30 A M Medical Record Number: [id number] Patient Account Number: [id number] Date of Birth/Sex: Treating [name]: 1951/05/13 (70 y.o. [name] Primary Care Zaryah Seckel: [name] Other Clinician: Referring Sila Sarsfield: Treating Jeslin Bazinet/Extender: [name] in Treatment: 287 Wound Status Wound Number: [id number] Primary Diabetic Wound/Ulcer of the Lower Extremity Etiology: Wound Location: Left T Second oe Wound Healed - Epithelialized Wounding Event: Gradually Appeared Status: Date Acquired: 01/31/2021 Comorbid Chronic sinus problems/congestion, Arrhythmia, Hypertension, Weeks Of Treatment: 25 History: Peripheral Arterial Disease, Type II Diabetes, History of Burn, Clustered Wound: No Gout, Confinement Anxiety Photos Wound Measurements Length: (cm) 0 % Width: (cm) 0 % Depth: (cm) 0 Ep Area: (cm) 0 Volume: (cm) 0 Reduction in Area: 100% Reduction in Volume: 100% ithelialization: Large (67-100%) Wound Description Classification: Grade 2 F Wound Margin: Distinct, outline attached S Exudate Amount: Small Exudate Type: Serous Exudate Color: amber oul Odor After Cleansing: No lough/Fibrino No Wound Bed Granulation Amount: Large (67-100%) Exposed Structure Granulation Quality: Pink, Pale Fascia Exposed: No Necrotic Amount: None Present (0%) Fat Layer (Subcutaneous Tissue) Exposed: Yes Tendon Exposed: No Muscle Exposed: No Joint Exposed: No Bone Exposed: No Treatment Notes Wound #193 (Toe Second) Wound Laterality: Left Cleanser Peri-Wound Care Topical Primary Dressing Secondary Dressing Secured With Compression Wrap Compression Stockings Add-Ons Notes bil 4 layer wraps Electronic Signature(s) Signed: 07/25/2021 5:58:14 PM By:  [name], BSN Entered By: [name] on 07/25/2021 11:12:28 -------------------------------------------------------------------------------- Vitals Details Patient Name: Date of  Service: [name], GANG. 07/25/2021 10:30 A M Medical Record Number: [id number] Patient Account Number: [id number] Date of Birth/Sex: Treating [name]: 02/06/1951 (70 y.o. [name] Primary Care Tedric Leeth: [name] Other Clinician: Referring Benson Porcaro: Treating Traniya Prichett/Extender: [name] in Treatment: 287 Vital Signs Time Taken: 11:04 Temperature (F): 97.8 Height (in): 70 Pulse (bpm): 81 Weight (lbs): 380.2 Respiratory Rate (breaths/min): 18 Body Mass Index (BMI): 54.5 Blood Pressure (mmHg): 161/68 Reference Range: 80 - 120 mg / dl Electronic Signature(s) Signed: 07/31/2021 12:59:59 PM By: Sandre Kitty Entered By: Sandre Kitty on 07/25/2021 11:04:54

## 2021-08-01 ENCOUNTER — Other Ambulatory Visit: Payer: Self-pay

## 2021-08-01 ENCOUNTER — Encounter (HOSPITAL_BASED_OUTPATIENT_CLINIC_OR_DEPARTMENT_OTHER): Payer: Medicare Other | Admitting: Physician Assistant

## 2021-08-01 DIAGNOSIS — E1140 Type 2 diabetes mellitus with diabetic neuropathy, unspecified: Secondary | ICD-10-CM | POA: Diagnosis not present

## 2021-08-01 DIAGNOSIS — I890 Lymphedema, not elsewhere classified: Secondary | ICD-10-CM | POA: Diagnosis not present

## 2021-08-01 DIAGNOSIS — L97519 Non-pressure chronic ulcer of other part of right foot with unspecified severity: Secondary | ICD-10-CM | POA: Diagnosis not present

## 2021-08-01 DIAGNOSIS — I87333 Chronic venous hypertension (idiopathic) with ulcer and inflammation of bilateral lower extremity: Secondary | ICD-10-CM | POA: Diagnosis not present

## 2021-08-01 DIAGNOSIS — E11622 Type 2 diabetes mellitus with other skin ulcer: Secondary | ICD-10-CM | POA: Diagnosis not present

## 2021-08-01 DIAGNOSIS — E11621 Type 2 diabetes mellitus with foot ulcer: Secondary | ICD-10-CM | POA: Diagnosis not present

## 2021-08-01 DIAGNOSIS — L97521 Non-pressure chronic ulcer of other 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 of skin: Secondary | ICD-10-CM | POA: Diagnosis not present

## 2021-08-01 DIAGNOSIS — L97829 Non-pressure chronic ulcer of other part of left lower leg with unspecified severity: Secondary | ICD-10-CM | POA: Diagnosis not present

## 2021-08-01 NOTE — Progress Notes (Signed)
KWELI, GRASSEL ([id number]) Visit Report for 08/01/2021 Arrival Information Details Patient Name: Date of Service: CO KEVAL, NAM 08/01/2021 10:30 A M Medical Record Number: [id number] Patient Account Number: [id number] Date of Birth/Sex: Treating RN: January 10, 1951 (70 y.o. [name] Primary Care Pyper Olexa: Nicoletta Ba Other Clinician: Referring Eleftherios Dudenhoeffer: Treating Lilienne Weins/Extender: Adele Dan in Treatment: 288 Visit Information History Since Last Visit Added or deleted any medications: Yes Patient Arrived: Walker Any new allergies or adverse reactions: No Arrival Time: 11:15 Had a fall or experienced change in No Accompanied By: self activities of daily living that may affect Transfer Assistance: None risk of falls: Patient Identification Verified: Yes Signs or symptoms of abuse/neglect since last visito No Secondary Verification Process Completed: Yes Hospitalized since last visit: No Patient Requires Transmission-Based Precautions: No Implantable device outside of the clinic excluding No Patient Has Alerts: Yes cellular tissue based products placed in the center since last visit: Has Dressing in Place as Prescribed: Yes Has Compression in Place as Prescribed: Yes Pain Present Now: No Electronic Signature(s) Signed: 08/01/2021 5:46:28 PM By: Zenaida Deed RN, BSN Entered By: Zenaida Deed on 08/01/2021 11:16:50 -------------------------------------------------------------------------------- Compression Therapy Details Patient Name: Date of Service: [name], Davante J. 08/01/2021 10:30 A M Medical Record Number: [id number] Patient Account Number: [id number] Date of Birth/Sex: Treating RN: April 17, 1951 (70 y.o. [name] Primary Care Ieshia Hatcher: Nicoletta Ba Other Clinician: Referring Jenesis Martin: Treating Revel Stellmach/Extender: Adele Dan in Treatment: 288 Compression Therapy Performed for Wound Assessment:  NonWound Condition Lymphedema - Right Leg Performed By: Clinician Shawn Stall, RN Compression Type: Four Layer Electronic Signature(s) Signed: 08/01/2021 5:54:54 PM By: Shawn Stall RN, BSN Entered By: Shawn Stall on 08/01/2021 11:59:19 -------------------------------------------------------------------------------- Compression Therapy Details Patient Name: Date of Service: [name], Maricela J. 08/01/2021 10:30 A M Medical Record Number: [id number] Patient Account Number: [id number] Date of Birth/Sex: Treating RN: 06/26/1951 (70 y.o. [name] Primary Care Mahad Newstrom: Nicoletta Ba Other Clinician: Referring Jamarien Rodkey: Treating Antony Sian/Extender: Adele Dan in Treatment: 288 Compression Therapy Performed for Wound Assessment: NonWound Condition Lymphedema - Left Leg Performed By: Clinician Shawn Stall, RN Compression Type: Double Layer Electronic Signature(s) Signed: 08/01/2021 5:54:54 PM By: Shawn Stall RN, BSN Entered By: Shawn Stall on 08/01/2021 11:59:45 -------------------------------------------------------------------------------- Encounter Discharge Information Details Patient Name: Date of Service: [facility], Argel J. 08/01/2021 10:30 A M Medical Record Number: [id number] Patient Account Number: [id number] Date of Birth/Sex: Treating RN: 03-17-1951 (70 y.o. [name] Primary Care Paislyn Domenico: Nicoletta Ba Other Clinician: Referring Ijanae Macapagal: Treating Iyonna Rish/Extender: Adele Dan in Treatment: 288 Encounter Discharge Information Items Discharge Condition: Stable Ambulatory Status: Walker Discharge Destination: Home Transportation: Private Auto Accompanied By: self Schedule Follow-up Appointment: Yes Clinical Summary of Care: Electronic Signature(s) Signed: 08/01/2021 5:54:54 PM By: Shawn Stall RN, BSN Entered By: Shawn Stall on 08/01/2021  12:02:31 -------------------------------------------------------------------------------- Patient/Caregiver Education Details Patient Name: Date of Service: [name], [name] 9/28/2022andnbsp10:30 A M Medical Record Number: [id number] Patient Account Number: [id number] Date of Birth/Gender: Treating RN: 1951/10/21 (70 y.o. [name] Primary Care Physician: Nicoletta Ba Other Clinician: Referring Physician: Treating Physician/Extender: Adele Dan in Treatment: [phone] Education Assessment Education Provided To: Patient Education Topics Provided Venous: Methods: Explain/Verbal Responses: Reinforcements needed, State content correctly Electronic Signature(s) Signed: 08/01/2021 5:46:28 PM By: Zenaida Deed RN, BSN Entered By: Zenaida Deed on 08/01/2021 12:02:13 -------------------------------------------------------------------------------- Vitals Details Patient Name: Date of Service: [name], Donel J. 08/01/2021 10:30  A M Medical Record Number: [id number] Patient Account Number: [id number] Date of Birth/Sex: Treating RN: 1950/12/18 (70 y.o. [name] Primary Care Wilhelmine Krogstad: Nicoletta Ba Other Clinician: Referring Ahmari Garton: Treating Harlee Eckroth/Extender: Adele Dan in Treatment: 288 Vital Signs Time Taken: 11:19 Temperature (F): 97.6 Height (in): 70 Pulse (bpm): 84 Source: Stated Respiratory Rate (breaths/min): 22 Weight (lbs): 380.2 Blood Pressure (mmHg): 126/70 Source: Stated Capillary Blood Glucose (mg/dl): 364 Body Mass Index (BMI): 54.5 Reference Range: 80 - 120 mg / dl Electronic Signature(s) Signed: 08/01/2021 5:46:28 PM By: Zenaida Deed RN, BSN Entered By: Zenaida Deed on 08/01/2021 11:20:48

## 2021-08-03 ENCOUNTER — Ambulatory Visit (INDEPENDENT_AMBULATORY_CARE_PROVIDER_SITE_OTHER): Payer: Medicare Other | Admitting: Family Medicine

## 2021-08-03 ENCOUNTER — Telehealth: Payer: Self-pay

## 2021-08-03 ENCOUNTER — Other Ambulatory Visit: Payer: Self-pay

## 2021-08-03 ENCOUNTER — Encounter: Payer: Self-pay | Admitting: Family Medicine

## 2021-08-03 VITALS — BP 123/72 | HR 72 | Temp 97.7°F | Ht 70.0 in | Wt 388.4 lb

## 2021-08-03 DIAGNOSIS — I4891 Unspecified atrial fibrillation: Secondary | ICD-10-CM

## 2021-08-03 DIAGNOSIS — I10 Essential (primary) hypertension: Secondary | ICD-10-CM

## 2021-08-03 DIAGNOSIS — N1831 Chronic kidney disease, stage 3a: Secondary | ICD-10-CM

## 2021-08-03 DIAGNOSIS — R001 Bradycardia, unspecified: Secondary | ICD-10-CM

## 2021-08-03 MED ORDER — CLONIDINE HCL 0.3 MG PO TABS: 0.3000 mg | ORAL_TABLET | Freq: Three times a day (TID) | ORAL | 3 refills | Status: DC

## 2021-08-03 MED ORDER — AMLODIPINE BESYLATE 10 MG PO TABS: 10.0000 mg | ORAL_TABLET | Freq: Every day | ORAL | 3 refills | Status: DC

## 2021-08-03 NOTE — Progress Notes (Signed)
OFFICE VISIT  08/03/2021  CC: f/u HTN   HPI:    Patient is a 70 y.o. Caucasian male who presents for 8 day f/u uncontrolled HTN in the setting of a-fib, bradycardia, and CRI III. A/P as of last visit: "Uncontrolled HTN in the setting of a-fib with bradycardia. D/c of metoprolol and cutting back on dose of diltiazem has helped bring HR up to acceptable range. Will add amlodipine for bp control.  Therapeutic expectations and side effect profile of medication discussed today.  Patient's questions answered. Titrate clonidine from 0.1mg  bid up to max of 0.3 bid if bp remains >150/90. Cont lisinopril 40 qd.   Pt with CRI III; most recent sCr stable at 1.5."  INTERIM HX: He's feeling well.  BPs at home over the last week: syst range 122-224, avg 175 or so.  Diast range 76-121, avg 100 or so. HR 61-85, avg 75. 126/70, HR 84 at wound care visit recently. No side effects from amlodipine.    Past Medical History:  Diagnosis Date   ALLERGIC RHINITIS 08/11/2006   ASTHMA 08/11/2006   Chronic atrial fibrillation (HCC) 08/2014   Chronic combined systolic and diastolic CHF (congestive heart failure) [facility])    Cardiology f/u 12/2017: pt volume overloaded (R heart dysf suspected), BNP very high, lasix increased.  Repeat echo 12/2017: normal LV EF, mild DD, +RV syst dysfxn, mod pulm HTN, biatrial enlgmt.   Chronic constipation    Chronic renal insufficiency, stage 2 (mild)    Borderline stage III (GFR 60s).   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  Plan repeat CBC at f/u summer 2019 and if decreased from baseline will check hemoccults and iron labs again.   OBESITY, MORBID 12/14/2007    saxenda started 05/2020 by [facility] wt mgmt center   OSA (obstructive sleep apnea)    to get sleep study with [facility]) as of 12/03/2018 consult.   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  [id number] ID started him on amoxil prophylaxis---ineffective.  End 2018/Jan 2019 penicillin G injections prophyl helpful but pt declined to continue this as of 01/2018 ID f/u.  ID talked him into resuming monthly penicillin G as of 02/2018 f/u.     Restless leg syndrome    Rx'd clonazepam 09/2017 and pt refused to take it after reading the medication's potential side effects.   Venous stasis ulcers of both lower extremities (HCC)    Severe lymphedema.  wound clinic care ongoing as of 01/2018    Past Surgical History:  Procedure Laterality Date   Carotid dopplers  07/23/2018   Left NORMAL.  Right 1-39% ICA stenosis, with <50% distal CCA stenosis (not hemodynamically significant)   TRANSTHORACIC ECHOCARDIOGRAM  11/2007; 09/2014; 11/2015;12/2017   LV fxn normal, EF normal, mild dilation of left atrium.  2015 grade II diast dysfxn.  2017 EF 55-60%. 2019: LVEF 60-65%, mild RV syst dysf,biatrial enlgmt, mod pulm htn.   URETERAL STENT PLACEMENT     virtual colonoscopy  01/2011   Normal    Outpatient Medications Prior to Visit  Medication Sig Dispense Refill   arginine 500 MG tablet  Take by mouth.     b complex vitamins capsule Take 1 capsule by mouth every morning.     butalbital-acetaminophen-caffeine (FIORICET WITH CODEINE) 50-325-40-30 MG capsule TAKE 1 TO 2 CAPSULES BY  MOUTH EVERY 6 HOURS AS  NEEDED FOR HEADACHE(S)  MANUFACTURER RECOMMENDS NOT EXCEEDING 6 CAPSULES/DAY 60 capsule 0   cetirizine (ZYRTEC) 10 MG tablet Take 10 mg by mouth daily.     CHROMIUM GTF PO Take by mouth daily.     Coenzyme Q10 (CO Q 10) 10 MG CAPS Take by mouth. Reported on 02/19/2016     diltiazem (TIAZAC) 120 MG 24 hr capsule Take 1 capsule (120 mg total) by  mouth daily. 30 capsule 0   furosemide (LASIX) 40 MG tablet TAKE 2 TABLETS BY MOUTH  TWICE DAILY 360 tablet 1   Glutamine 500 MG CAPS Take by mouth.     HYDROcodone-acetaminophen (NORCO/VICODIN) 5-325 MG tablet 1-2 tabs po bid prn pain 60 tablet 0   Insulin Lispro Prot & Lispro (HUMALOG MIX 75/25 KWIKPEN) (75-25) 100 UNIT/ML Kwikpen INJECT SUBCUTANEOUSLY 25  UNITS EVERY MORNING AND 20  UNITS EVERY EVENING AT  SUPPER 45 mL 3   Insulin Pen Needle (B-D ULTRAFINE III SHORT PEN) 31G X 8 MM MISC USE 1 PENNEEDLE TWICE DAILY 200 each 1   lisinopril (ZESTRIL) 40 MG tablet TAKE 1 TABLET BY MOUTH  DAILY 90 tablet 3   metFORMIN (GLUCOPHAGE) 1000 MG tablet TAKE 1 TABLET BY MOUTH  TWICE DAILY WITH MEALS 180 tablet 1   Multiple Vitamin (MULTIVITAMIN) tablet Take 1 tablet by mouth daily.     ONETOUCH ULTRA test strip CHECK BLOOD SUGAR TWICE  DAILY 200 strip 3   rivaroxaban (XARELTO) 20 MG TABS tablet TAKE 1 TABLET BY MOUTH ONCE DAILY WITH SUPPER 90 tablet 3   terazosin (HYTRIN) 10 MG capsule TAKE 1 CAPSULE BY MOUTH  ONCE DAILY AT BEDTIME 90 capsule 3   vitamin E 400 UNIT capsule Take 400 Units by mouth every morning. Selenium 50mg      amLODipine (NORVASC) 10 MG tablet Take 1 tablet (10 mg total) by mouth daily. 30 tablet 0   cloNIDine (CATAPRES) 0.1 MG tablet 1 tab po tid.  Take an extra tab as needed for bp > 180 on top or >100 on bottom (Patient taking differently: Take 0.1 mg by mouth 2 (two) times daily. 1 tab po tid.  Take an extra tab as needed for bp > 180 on top or >100 on bottom) 90 tablet 3   penicillin g benzathine (BICILLIN LA) 1200000 UNIT/2ML injection 600,000 Units      penicillin g benzathine (BICILLIN LA) 1200000 UNIT/2ML injection 600,000 Units      penicillin g benzathine (BICILLIN LA) 1200000 UNIT/2ML injection 600,000 Units      penicillin g benzathine (BICILLIN LA) 1200000 UNIT/2ML injection 600,000 Units      No facility-administered medications prior to visit.    Allergies  Allergen  Reactions   Other Other (See Comments)    Sneezing, coughing   Hydralazine Other (See Comments)    Drowsiness/sedation/mental fog    ROS As per HPI  PE: Vitals with BMI 08/03/2021 07/26/2021 07/18/2021  Height 5\' 10"  5\' 10"  5\' 10"   Weight 388 lbs 6 oz 380 lbs 381 lbs  BMI 55.73 54.52 54.67  Systolic 123 147 94  Diastolic 72 53 44  Pulse 72 56 46     Gen: Alert, well appearing.  Patient is oriented to person, place, time, and situation. AFFECT:  pleasant, lucid thought and speech. No further exam today.  LABS:    Chemistry      Component Value Date/Time   NA 138 07/11/2021 0844   NA 141 02/09/2020 1453   K 4.4 07/11/2021 0844   CL 99 07/11/2021 0844   CO2 27 07/11/2021 0844   BUN 25 (H) 07/11/2021 0844   BUN 20 02/09/2020 1453   CREATININE 1.51 (H) 07/11/2021 0844   CREATININE 1.57 (H) 04/04/2021 0844      Component Value Date/Time   CALCIUM 8.6 07/11/2021 0844   ALKPHOS 70 07/12/2020 1015   AST 15 04/04/2021 0844   ALT 11 04/04/2021 0844   BILITOT 1.0 04/04/2021 0844     Lab Results  Component Value Date   WBC 8.1 04/04/2021   HGB 13.4 04/04/2021   HCT 43.6 04/04/2021   MCV 84.8 04/04/2021   PLT 236 04/04/2021   Lab Results  Component Value Date   HGBA1C 7.9 (H) 07/11/2021   IMPRESSION AND PLAN:  1) Uncontrolled HTN.  His HR is now very good since getting off BB and lowering diltiazem dose. BP still not at goal with amlod 10 qd, lisinopril 40 qd, diltiazem 120 qd, clonidine 0.1mg  bid, and hytrin 10 qd. Will increase clonidine to 0.3mg  tid and continue all other meds as-is. He does not want to do clonidine patch b/c he's afraid it won't stay on his skin. He will send me bp numbers via mychart in a couple weeks.  An After Visit Summary was printed and given to the patient.  FOLLOW UP: Return for keep appt set for 10/10/21.  Signed:  Santiago Bumpers, MD           08/03/2021

## 2021-08-03 NOTE — Telephone Encounter (Signed)
Patient had appt today with Dr. Milinda Cave.  He mentioned that his [place] Placard was expiring end of year.  [place] Placard form was completed by patient and placed in McGowen's inbox at front office. Patient said he will pick up at his next visit.  Does not need until December, when current placard expires.  If any questions, patient can be reached at [phone]

## 2021-08-03 NOTE — Progress Notes (Signed)
KASH, DAVIE ([id number]) Visit Report for 08/01/2021 SuperBill Details Patient Name: Date of Service: CO ROYSTON, BEKELE 08/01/2021 Medical Record Number: [id number] Patient Account Number: [id number] Date of Birth/Sex: Treating RN: 11/12/1950 (70 y.o. 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 Electronic Signature(s) Signed: 08/01/2021 5:54:54 PM By: Shawn Stall RN, BSN Signed: 08/03/2021 4:42:31 PM By: Lenda Kelp PA-C Entered By: Shawn Stall on 08/01/2021 12:02:41

## 2021-08-06 NOTE — Telephone Encounter (Signed)
Signed and put in box to go up front. Signed:  Santiago Bumpers, MD           08/06/2021

## 2021-08-06 NOTE — Telephone Encounter (Signed)
Placed on PCP desk to review and sign, if appropriate.  

## 2021-08-08 ENCOUNTER — Encounter (HOSPITAL_BASED_OUTPATIENT_CLINIC_OR_DEPARTMENT_OTHER): Payer: Medicare Other | Attending: Physician Assistant | Admitting: Physician Assistant

## 2021-08-08 ENCOUNTER — Ambulatory Visit: Payer: Medicare Other | Admitting: Family Medicine

## 2021-08-08 ENCOUNTER — Other Ambulatory Visit: Payer: Self-pay

## 2021-08-08 DIAGNOSIS — L97829 Non-pressure chronic ulcer of other part of left lower leg with unspecified severity: Secondary | ICD-10-CM | POA: Diagnosis not present

## 2021-08-08 DIAGNOSIS — L97521 Non-pressure chronic ulcer of other 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 of skin: Secondary | ICD-10-CM | POA: Diagnosis not present

## 2021-08-08 DIAGNOSIS — L97812 Non-pressure chronic ulcer of other part of right lower leg with fat layer exposed: Secondary | ICD-10-CM | POA: Insufficient documentation

## 2021-08-08 DIAGNOSIS — L03115 Cellulitis of right lower limb: Secondary | ICD-10-CM | POA: Diagnosis not present

## 2021-08-08 DIAGNOSIS — E1140 Type 2 diabetes mellitus with diabetic neuropathy, unspecified: Secondary | ICD-10-CM | POA: Insufficient documentation

## 2021-08-08 DIAGNOSIS — I890 Lymphedema, not elsewhere classified: Secondary | ICD-10-CM | POA: Diagnosis not present

## 2021-08-08 DIAGNOSIS — L97219 Non-pressure chronic ulcer of right calf with unspecified severity: Secondary | ICD-10-CM | POA: Diagnosis not present

## 2021-08-08 DIAGNOSIS — L97519 Non-pressure chronic ulcer of other part of right foot with unspecified severity: Secondary | ICD-10-CM | POA: Insufficient documentation

## 2021-08-08 DIAGNOSIS — L03116 Cellulitis of left lower limb: Secondary | ICD-10-CM | POA: Diagnosis not present

## 2021-08-08 DIAGNOSIS — E11621 Type 2 diabetes mellitus with foot ulcer: Secondary | ICD-10-CM | POA: Insufficient documentation

## 2021-08-08 DIAGNOSIS — I87333 Chronic venous hypertension (idiopathic) with ulcer and inflammation of bilateral lower extremity: Secondary | ICD-10-CM | POA: Insufficient documentation

## 2021-08-08 DIAGNOSIS — Z794 Long term (current) use of insulin: Secondary | ICD-10-CM | POA: Insufficient documentation

## 2021-08-08 DIAGNOSIS — L97512 Non-pressure chronic ulcer of other part of right foot with fat layer exposed: Secondary | ICD-10-CM | POA: Diagnosis not present

## 2021-08-08 DIAGNOSIS — E11622 Type 2 diabetes mellitus with other skin ulcer: Secondary | ICD-10-CM | POA: Diagnosis not present

## 2021-08-08 NOTE — Progress Notes (Addendum)
FOREST, REDWINE ([id number]) Visit Report for 08/08/2021 Chief Complaint Document Details Patient Name: Date of Service: Kevin LEOCADIO, HEAL 08/08/2021 10:30 A M Medical Record Number: [id number] Patient Account Number: [id number] Date of Birth/Sex: Treating RN: 09-26-1951 (70 y.o. 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 08/08/2021 10:30 A M Medical Record Number: [id number] Patient Account Number: [id number] Date of Birth/Sex: Treating RN: Apr 29, 1951 (70 y.o. [name] Primary Care Provider: Nicoletta Ba Other Clinician: Referring Provider: Treating Provider/Extender: Adele Dan in Treatment: 289 History of Present Illness HPI Description: Referred by PCP for consultation. Patient has long standing history of BLE venous stasis, no prior ulcerations. At beginning of month, developed cellulitis and weeping. Received IM Rocephin followed by Keflex and resolved. Wears compression stocking, appr 6 months old. Not sure strength. No present drainage. 01/22/16 this is a patient who is a type II diabetic on insulin. 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 He tells me he religiously wears pressure stockings of uncertain strength. He was here with weeping edema about 8 months ago but did not have an open wound. Roughly a month ago he had a  reopening on his bilateral legs. He is been using bandages and Neosporin. He does not complain of pain. 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 Last BUN/creatinine I have is from 11/20/15 at 13 and 1.0 respectively 01/29/16; patient arrives today having tolerated the Profore wrap. He brought in his stockings and these are 18 mmHg stockings he bought from [place]. The compression here is likely inadequate. He does not complain of pain or excessive drainage she has no systemic symptoms. The wound on the right looks improved as does the one on the left although one on the left is more substantial with still tissue at risk below the actual wound area on the bilateral posterior calf 02/05/16; patient arrives with poor edema control. He states that we did put a 4 layer compression on it last week. No weight appear 5 this. 02/12/16; the area on the posterior right Has healed. The left Has a substantial wound that has necrotic surface eschar that requires a debridement with a curette. 02/16/16;the patient called or a Nurse visit secondary to increased swelling. He had been in earlier in the week with his right leg healed. He was transitioned to is on pressure stocking on the right leg with the only open wound on the left, a substantial area on the left posterior calf. Note he has a history of severe lower extremity edema, he has a history of chronic atrial fibrillation but not heart failure per my notes but I'll need to research this. He is not complaining of chest pain shortness of breath or orthopnea. The intake nurse noted blisters on the previously closed right leg 02/19/16; this is the patient's regular visit day. I see him on Friday with escalating edema new wounds on the right leg and clear signs of at least right  ventricular heart failure. I increased his Lasix to 40 twice a day. He is returning  currently in follow-up. States he is noticed a decrease in that the edema 02/26/16 patient's legs have much less edema. There is nothing really open on the right leg. The left leg has improved condition of the large superficial wound on the posterior left leg 03/04/16; edema control is very much better. The patient's right leg wounds have healed. On the left leg he continues to have severe venous inflammation on the posterior aspect of the left leg. There is no tenderness and I don't think any of this is cellulitis. 03/11/16; patient's right leg is married healed and he is in his own stocking. The patient's left leg has deteriorated somewhat. There is a lot of erythema around the wound on the posterior left leg. There is also a significant rim of erythema posteriorly just above where the wrap would've ended there is a new wound in this location and a lot of tenderness. Can't rule out cellulitis in this area. 03/15/16; patient's right leg remains healed and he is in his own stocking. The patient's left leg is much better than last review. His major wound on the posterior aspect of his left Is almost fully epithelialized. He has 3 small injuries from the wraps. Really. Erythema seems a lot better on antibiotics 03/18/16; right leg remains healed and he is in his own stocking. The patient's left leg is much better. The area on the posterior aspect of the left calf is fully epithelialized. His 3 small injuries which were wrap injuries on the left are improved only one seems still open his erythema has resolved 03/25/16; patient's right leg remains healed and he is in his own stocking. There is no open area today on the left leg posterior leg is completely closed up. His wrap injuries at the superior aspect of his leg are also resolved. He looks as though he has some irritation on the dorsal ankle but this is fully epithelialized without evidence of infection. 03/28/16; we discharged this patient on Monday.  Transitioned him into his own stocking. There were problems almost immediately with uncontrolled swelling weeping edema multiple some of which have opened. He does not feel systemically unwell in particular no chest pain no shortness of breath and he does not feel 04/08/16; the edema is under better control with the Profore light wrap but he still has pitting edema. There is one large wound anteriorly 2 on the medial aspect of his left leg and 3 small areas on the superior posterior calf. Drainage is not excessive he is tolerating a Profore light well 04/15/16; put a Profore wrap on him last week. This is controlled is edema however he had a lot of pain on his left anterior foot most of his wounds are healed 04/22/16 once again the patient has denuded areas on the left anterior foot which he states are because his wrap slips up word. He saw his primary physician today is on Lasix 40 twice a day and states that he his weight is down 20 pounds over the last 3 months. 04/29/16: Much improved. left anterior foot much improved. He is now on Lasix 80 mg per day. Much improved edema control 05/06/16; I was hoping to be able to discharge him today however once again he has blisters at a low level of where the compression was placed last week mostly on his left lateral but also his left medial leg and a small area on  the anterior part of the left foot. 05/09/16; apparently the patient went home after his appointment on 7/4 later in the evening developing pain in his upper medial thigh together with subjective fever and chills although his temperature was not taken. The pain was so intense he felt he would probably have to call 911. However he then remembered that he had leftover doxycycline from a previous round of antibiotics and took these. By the next morning he felt a lot better. He called and spoke to one of our nurses and I approved doxycycline over the phone thinking that this was in relation to the wounds we had  previously seen although they were definitely were not. The patient feels a lot better old fever no chills he is still working. Blood sugars are reasonably controlled 05/13/16; patient is back in for review of his cellulitis on his anterior medial upper thigh. He is taking doxycycline this is a lot better. Culture I did of the nodular area on the dorsal aspect of his foot grew MRSA this also looks a lot better. 05/20/16; the patient is cellulitis on the medial upper thigh has resolved. All of his wound areas including the left anterior foot, areas on the medial aspect of the left calf and the lateral aspect of the calf at all resolved. He has a new blister on the left dorsal foot at the level of the fourth toe this was excised. No evidence of infection 05/27/16; patient continues to complain weeping edema. He has new blisterlike wounds on the left anterior lateral and posterior lateral calf at the top of his wrap levels. The area on his left anterior foot appears better. He is not complaining of fever, pain or pruritus in his feet. 05/30/16; the patient's blisters on his left anterior leg posterior calf all look improved. He did not increase the Lasix 100 mg as I suggested because he was going to run out of his 40 mg tablets. He is still having weeping edema of his toes 06/03/16; I renewed his Lasix at 80 mg once a day as he was about to run out when I last saw him. He is on 80 mg of Lasix now. I have asked him to cut down on the excessive amount of water he was drinking and asked him to drink according to his thirst mechanisms 06/12/2016 -- was seen 2 days ago and was supposed to wear his compression stockings at home but he is developed lymphedema and superficial blisters on the left lower extremity and hence came in for a review 06/24/16; the remaining wound is on his left anterior leg. He still has edema coming from between his toes. There is lymphedema here however his edema is generally better than  when I last saw this. He has a history of atrial fibrillation but does not have a known history of congestive heart failure nevertheless I think he probably has this at least on a diastolic basis. 07/01/16 I reviewed his echocardiogram from January 2017. This was essentially normal. He did not have LVH, EF of 55-60%. His right ventricular function was normal although he did have trivial tricuspid and pulmonic regurgitation. This is not audible on exam however. I increased his Lasix to do massive edema in his legs well above his knees I think in early July. He was also drinking an excessive amount of water at the time. 07/15/16; missed his appointment last week because of the Labor Day holiday on Monday. He could not get another appointment later  in the week. Started to feel the wrap digging in superiorly so we remove the top half and the bottom half of his wrap. He has extensive erythema and blistering superiorly in the left leg. Very tender. Very swollen. Edema in his foot with leaking edema fluid. He has not been systemically unwell 07/22/16; the area on the left leg laterally required some debridement. The medial wounds look more stable. His wrap injury wounds appear to have healed. Edema and his foot is better, weeping edema is also better. He tells me he is meeting with the supplier of the external compression pumps at work 08/05/16; the patient was on vacation last week in [facility]. His wrap is been on for an extended period of time. Also over the weekend he developed an extensive area of tender erythema across his anterior medial thigh. He took to doxycycline yesterday that he had leftover from a previous prescription. The patient complains of weeping edema coming out of his toes 08/08/16; I saw this patient on 10/2. He was tender across his anterior thigh. I put him on doxycycline. He returns today in follow-up. He does not have any open wounds on his lower leg, he still has edema weeping into  his toes. 08/12/16; patient was seen back urgently today to follow-up for his extensive left thigh cellulitis/erysipelas. He comes back with a lot less swelling and erythema pain is much better. I believe I gave him Augmentin and Cipro. His wrap was cut down as he stated a roll down his legs. He developed blistering above the level of the wrap that remained. He has 2 open blisters and 1 intact. 08/19/16; patient is been doing his primary doctor who is increased his Lasix from 40-80 once a day or 80 already has less edema. Cellulitis has remained improved in the left thigh. 2 open areas on the posterior left calf 08/26/16; he returns today having new open blisters on the anterior part of his left leg. He has his compression pumps but is not yet been shown how to use some vital representative from the supplier. 09/02/16 patient returns today with no open wounds on the left leg. Some maceration in his plantar toes 09/10/2016 -- Dr. Leanord Hawking had recently discharged him on 09/02/2016 and he has come right back with redness swelling and some open ulcers on his left lower extremity. He says this was caused by trying to apply his compression stockings and he's been unable to use this and has not been able to use his lymphedema pumps. He had some doxycycline leftover and he has started on this a few days ago. 09/16/16; there are no open wounds on his leg on the left and no evidence of cellulitis. 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 He does not complain of symptoms here. I am not clear use using his external compression pumps. 09/23/16; I have not seen this patient in 2 weeks. He canceled his appointment 10 days ago as he was going on vacation. He tells me that on Monday he noticed a large area on his posterior left leg which is been draining copiously and is reopened into a large wound. He is been using ABDs and the external part of his juxtalite,  according to our nurse this was not on properly. 10/07/16; Still a substantial area on the posterior left leg. Using silver alginate 10/14/16; in general better although there is still open area which looks healthy. Still using silver alginate.  He reminds me that this happen before he left for [facility]. T oday while he was showering in the morning. He had been using his juxtalite's 10/21/16; the area on his posterior left leg is fully epithelialized. However he arrives today with a large area of tender erythema in his medial and posterior left thigh just above the knee. I have marked the area. Once again he is reluctant to consider hospitalization. I treated him with oral antibiotics in the past for a similar situation with resolution I think with doxycycline however this area it seems more extensive to me. He is not complaining of fever but does have chills and says states he is thirsty. His blood sugar today was in the 140s at home 10/25/16 the area on his posterior left leg is fully epithelialized although there is still some weeping edema. The large area of tenderness and erythema in his medial and posterior left thigh is a lot less tender although there is still a lot of swelling in this thigh. He states he feels a lot better. He is on doxycycline and Augmentin that I started last week. This will continued until Tuesday, December 26. I have ordered a duplex ultrasound of the left thigh rule out DVT whether there is an abscess something that would need to be drained I would also like to know. 11/01/16; he still has weeping edema from a not fully epithelialized area on his left posterior calf. Most of the rest of this looks a lot better. He has completed his antibiotics. His thigh is a lot better. Duplex ultrasound did not show a DVT in the thigh 11/08/16; he comes in today with more Denuded surface epithelium from the posterior aspect of his calf. There is no real evidence of cellulitis. The  superior aspect of his wrap appears to have put quite an indentation in his leg just below the knee and this may have contributed. He does not complain of pain or fever. We have been using silver alginate as the primary dressing. The area of cellulitis in the right thigh has totally resolved. He has been using his compression stockings once a week 11/15/16; the patient arrives today with more loss of epithelium from the posterior aspect of his left calf. He now has a fairly substantial wound in this area. The reason behind this deterioration isn't exactly clear although his edema is not well controlled. He states he feels he is generally more swollen systemically. He is not complaining of chest pain shortness of breath fever. T me he has an appointment with his primary physician in early February. He is on 80 mg of oral ells Lasix a day. He claims compliance with the external compression pumps. He is not having any pain in his legs similar to what he has with his recurrent cellulitis 11/22/16; the patient arrives a follow-up of his large area on his left lateral calf. This looks somewhat better today. He came in earlier in the week for a dressing change since I saw him a week ago. He is not complaining of any pain no shortness of breath no chest pain 11/28/16; the patient arrives for follow-up of his large area on the left lateral calf this does not look better. In fact it is larger weeping edema. The surface of the wound does not look too bad. We have been using silver alginate although I'm not certain that this is a dressing issue. 12/05/16; again the patient follows up for a large wound on the left  lateral and left posterior calf this does not look better. There continues to be weeping edema necrotic surface tissue. More worrisome than this once again there is erythema below the wound involving the distal Achilles and heel suggestive of cellulitis. He is on his feet working most of the day of this is  not going well. We are changing his dressing twice a week to facilitate the drainage. 12/12/16; not much change in the overall dimensions of the large area on the left posterior calf. This is very inflamed looking. I gave him an. Doxycycline last week does not really seem to have helped. He found the wrap very painful indeed it seems to of dog into his legs superiorly and perhaps around the heel. He came in early today because the drainage had soaked through his dressings. 12/19/16- patient arrives for follow-up evaluation of his left lower extremity ulcers. He states that he is using his lymphedema pumps once daily when there is "no drainage". He admits to not using his lipedema pumps while under current treatment. His blood sugars have been consistently between 150-200. 12/26/16; the patient is not using his compression pumps at home because of the wetness on his feet. I've advised him that I think it's important for him to use this daily. He finds his feet too wet, he can put a plastic bag over his legs while he is in the pumps. Otherwise I think will be in a vicious circle. We are using silver alginate to the major area on his left posterior calf 01/02/17; the patient's posterior left leg has further of all into 3 open wounds. All of them covered with a necrotic surface. He claims to be using his compression pumps once a day. His edema control is marginal. Continue with silver alginate 01/10/17; the patient's left posterior leg actually looks somewhat better. There is less edema, less erythema. Still has 3 open areas covered with a necrotic surface requiring debridement. He claims to be using his compression pumps once a day his edema control is better 01/17/17; the patient's left posterior calf look better last week when I saw him and his wrap was changed 2 days ago. He has noted increasing pain in the left heel and arrives today with much larger wounds extensive erythema extending down into the entire  heel area especially tender medially. He is not systemically unwell CBGs have been controlled no fever. Our intake nurse showed me limegreen drainage on his AVD pads. 01/24/17; his usual this patient responds nicely to antibiotics last week giving him Levaquin for presumed Pseudomonas. The whole entire posterior part of his leg is much better much less inflamed and in the case of his Achilles heel area much less tender. He has also had some epithelialization posteriorly there are still open areas here and still draining but overall considerably better 01/31/17- He has continue to tolerate the compression wraps. he states that he continues to use the lymphedema pumps daily, and can increase to twice daily on the weekends. He is voicing no complaints or concerns regarding his LLE ulcers 02/07/17-he is here for follow-up evaluation. He states that he noted some erythema to the left medial and anterior thigh, which he states is new as of yesterday. He is concerned about recurrent cellulitis. He states his blood sugars have been slightly elevated, this morning in the 180s 02/14/17; he is here for follow-up evaluation. When he was last here there was erythema superiorly from his posterior wound in his anterior thigh. He was prescribed  Levaquin however a culture of the wound surface grew MRSA over the phone I changed him to doxycycline on Monday and things seem to be a lot better. 02/24/17; patient missed his appointment on Friday therefore we changed his nurse visit into a physician visit today. Still using silver alginate on the large area of the posterior left thigh. He isn't new area on the dorsal left second toe 03/03/17; actually better today although he admits he has not used his external compression pumps in the last 2 days or so because of work responsibilities over the weekend. 03/10/17; continued improvement. External compression pumps once a day almost all of his wounds have closed on the posterior left  calf. Better edema control 03/17/17; in general improved. He still has 3 small open areas on the lateral aspect of his left leg however most of the area on the posterior part of his leg is epithelialized. He has better edema control. He has an ABD pad under his stocking on the right anterior lower leg although he did not let us look at that today. 03/24/17; patient arrives back in clinic today with no open areas however there are areas on the posterior left calf and anterior left calf that are less than 100% epithelialized. His edema is well controlled in the left lower leg. There is some pitting edema probably lymphedema in the left upper thigh. He uses compression pumps at home once per day. I tried to get him to do this twice a day although he is very reticent. 04/01/2017 -- for the last 2 days he's had significant redness, tenderness and weeping and came in for an urgent visit today. 04/07/17; patient still has 6 more days of doxycycline. He was seen by Dr. Meyer Russel last Wednesday for cellulitis involving the posterior aspect, lateral aspect of his Involving his heel. For the most part he is better there is less erythema and less weeping. He has been on his feet for 12 hours 2 over the weekend. Using his compression pumps once a day 04/14/17 arrives today with continued improvement. Only one area on the posterior left calf that is not fully epithelialized. He has intense bilateral venous inflammation associated with his chronic venous insufficiency disease and secondary lymphedema. We have been using silver alginate to the left posterior calf wound In passing he tells [place] today that the right leg but we have not seen in quite some time has an open area on it but he doesn't want [place] to look at this today states he will show this to [place] next week. 04/21/17; there is no open area on his left leg although he still reports some weeping edema. He showed [place] his right leg today which is the first time we've  seen this leg in a long time. He has a large area of open wound on the right leg anteriorly healthy granulation. Quite a bit of swelling in the right leg and some degree of venous inflammation. He told us about the right leg in passing last week but states that deterioration in the right leg really only happened over the weekend 04/28/17; there is no open area on the left leg although there is an irritated part on the posterior which is like a wrap injury. The wound on the right leg which was new from last week at least to [place] is a lot better. 05/05/17; still no open area on the left leg. Patient is using his new compression stocking which seems to be doing a good  job of controlling the edema. He states he is using his compression pumps once per day. The right leg still has an open wound although it is better in terms of surface area. Required debridement. A lot of pain in the posterior right Achilles marked tenderness. Usually this type of presentation this patient gives concern for an active cellulitis 05/12/17; patient arrives today with his major wound from last week on the right lateral leg somewhat better. Still requiring debridement. He was using his compression stocking on the left leg however that is reopened with superficial wounds anteriorly he did not have an open wound on this leg previously. He is still using his juxta light's once daily at night. He cannot find the time to do this in the morning as he has to be at work by 7 AM 05/19/17; right lateral leg wound looks improved. No debridement required. The concerning area is on the left posterior leg which appears to almost have a subcutaneous hemorrhagic component to it. We've been using silver alginate to all the wounds 05/26/17; the right lateral leg wound continues to look improved. However the area on the left posterior calf is a tightly adherent surface. [place] using silver alginate. Because of the weeping edema in his legs there is very  little good alternatives. 06/02/17; the patient left here last week looking quite good. Major wound on the left posterior calf and a small one on the right lateral calf. Both of these look satisfactory. He tells me that by Wednesday he had noted increased pain in the left leg and drainage. He called on Thursday and Friday to get an appointment here but we were blocked. He did not go to urgent care or his primary physician. He thinks he had a fever on Thursday but did not actually take his temperature. He has not been using his compression pumps on the left leg because of pain. I advised him to go to the emergency room today for IV antibiotics for stents of left leg cellulitis but he has refused I have asked him to take 2 days off work to keep his leg elevated and he has refused this as well. In view of this I'm going to call him and Augmentin and doxycycline. 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 However after a detailed discussion regarding the risk benefits and alternatives he refuses to get admitted to the hospital. With no other recourse I will continue him on oral antibiotics as before and hopefully he'll have his infectious disease consultation this week. 06/16/2017 -- the patient was seen today by the nurse practitioner at infectious disease Ms. Dixon. 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 He has also been advise Lotrimin cream locally. she also had wise IV antibiotics for his condition if it flares up 06/23/17; patient arrives today with drainage bilaterally although  the remaining wound on the left posterior calf after cleaning up today "highlighter yellow drainage" did not look too bad. Unfortunately he has had breakdown on the right anterior leg [previously this leg had not been open and  he is using a black stocking] he went to see infectious disease and is been put on clindamycin 150 mg daily, I did not verify the dose although I'm not familiar with using clindamycin in this dosing range, perhaps for prophylaxisoo 06/27/17; I brought this patient back today to follow-up on the wound deterioration on the right lower leg together with surrounding cellulitis. I started him on doxycycline 4 days ago. This area looks better however he comes in today with intense cellulitis on the medial part of his left thigh. This is not have a wound in this area. Extremely tender. We've been using silver alginate to the wounds on the right lower leg left lower leg with bilateral 4 layer compression he is using his external compression pumps once a day 07/04/17; patient's left medial thigh cellulitis looks better. He has not been using his compression pumps as his insert said it was contraindicated with cellulitis. His right leg continues to make improvements all the wounds are still open. We only have one remaining wound on the left posterior calf. Using silver alginate to all open areas. He is on doxycycline which I started a week ago and should be finishing I gave him Augmentin after Thursday's visit for the severe cellulitis on the left medial thigh which fortunately looks better 07/14/17; the patient's left medial thigh cellulitis has resolved. The cellulitis in his right lower calf on the right also looks better. All of his wounds are stable to improved we've been using silver alginate he has completed the antibiotics I have given him. He has clindamycin 150 mg once a day prescribed by infectious disease for prophylaxis, I've advised him to start this now. We have been using  bilateral Unna boots over silver alginate to the wound areas 07/21/17; the patient is been to see infectious disease who noted his recurrent problems with cellulitis. He was not able to tolerate prophylactic clindamycin therefore he is on amoxicillin 500 twice a day. He also had a second daily dose of Lasix added By Dr. Oneta Rack but he is not taking this. Nor is he being completely compliant with his compression pumps a especially not this week. He has 2 remaining wounds one on the right posterior lateral lower leg and one on the left posterior medial lower leg. 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 The patient has Unna boots bilaterally. 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 He has bilateral Unna boots for compression and silver alginate to his wounds. Arrives today with his legs looking as good as I have seen him in quite some time. Not surprisingly his wounds look better as well with improvement on the right lateral leg venous insufficiency wound and also the left medial leg. He is still using the compression pumps once a day 08/11/17; both legs appear to be doing better wounds on the right lateral and left medial legs look better. Skin on the right leg quite good. He is been using silver alginate as the primary dressing. I'm going to use Anasept gel calcium alginate and maintain all the secondary dressings 08/18/17; the patient continues to actually do quite well. The area on his right lateral leg is just about closed the left medial also looks better although it is still moist in this area. His edema is  well controlled we have been using Anasept gel with calcium alginate and the usual secondary dressings, 4 layer compression and once daily use of his compression pumps  "always been able to manage 09/01/17; the patient continues to do reasonably well in spite of his trip to [place]. The area on the right lateral leg is epithelialized. Left is much better but still open. He has more edema and more chronic erythema on the left leg [venous inflammation] 09/08/17; he arrives today with no open wound on the right lateral leg and decently controlled edema. Unfortunately his left leg is not nearly as in his good situation as last week.he apparently had increasing edema starting on Saturday. He edema soaked through into his foot so used a plastic bag to walk around his home. The area on the medial right leg which was his open area is about the same however he has lost surface epithelium on the left lateral which is new and he has significant pain in the Achilles area of the left foot. He is already on amoxicillin chronically for prophylaxis of cellulitis in the left leg 09/15/17; he is completed a week of doxycycline and the cellulitis in the left posterior leg and Achilles area is as usual improved. He still has a lot of edema and fluid soaking through his dressings. There is no open wound on the right leg. He saw infectious disease NP today 09/22/17;As usual 1 we transition him from our compression wraps to his stockings things did not go well. He has several small open areas on the right leg. He states this was caused by the compression wrap on his skin although he did not wear this with the stockings over them. He has several superficial areas on the left leg medially laterally posteriorly. He does not have any evidence of active cellulitis especially involving the left Achilles The patient is traveling from [facility] Saturday going to [facility]. He states he isn't attempting to get an appointment with a heel objects wound center there to change his dressings. I am not completely certain whether this will work 10/06/17; the patient came in on Friday for a nurse visit  and the nurse reported that his legs actually look quite good. He arrives in clinic today for his regular follow-up visit. He has a new wound on his left third toe over the PIP probably caused by friction with his footwear. He has small areas on the left leg and a very superficial but epithelialized area on the right anterior lateral lower leg. Other than that his legs look as good as I've seen him in quite some time. We have been using silver alginate Review of systems; no chest pain no shortness of breath other than this a 10 point review of systems negative 10/20/17; seen by Dr. Meyer Russel last week. He had taken some antibiotics [doxycycline] that he had left over. Dr. Meyer Russel thought he had candida infection and declined to give him further antibiotics. He has a small wound remaining on the right lateral leg several areas on the left leg including a larger area on the left posterior several left medial and anterior and a small wound on the left lateral. The area on the left dorsal third toe looks a lot better. ROS; Gen.; no fever, respiratory no cough no sputum Cardiac no chest pain other than this 10 point review of system is negative 10/30/17; patient arrives today having fallen in the bathtub 3 days ago. It took him a while to get  up. He has pain and maceration in the wounds on his left leg which have deteriorated. He has not been using his pumps he also has some maceration on the right lateral leg. 11/03/17; patient continues to have weeping edema especially in the left leg. This saturates his dressings which were just put on on 12/27. As usual the doxycycline seems to take care of the cellulitis on his lower leg. He is not complaining of fever, chills, or other systemic symptoms. He states his leg feels a lot better on the doxycycline I gave him empirically. He also apparently gets injections at his primary doctor's officeo Rocephin for cellulitis prophylaxis. I didn't ask him about his  compression pump compliance today I think that's probably marginal. Arrives in the clinic with all of his dressings primary and secondary macerated full of fluid and he has bilateral edema 11/10/17; the patient's right leg looks some better although there is still a cluster of wounds on the right lateral. The left leg is inflamed with almost circumferential skin loss medially to laterally although we are still maintaining anteriorly. He does not have overt cellulitis there is a lot of drainage. He is not using compression pumps. We have been using silver alginate to the wound areas, there are not a lot of options here 11/17/17; the patient's right leg continues to be stable although there is still open wounds, better than last week. The inflammation in the left leg is better. Still loss of surface layer epithelium especially posteriorly. There is no overt cellulitis in the amount of edema and his left leg is really quite good, tells me he is using his compression pumps once a day. 11/24/17; patient's right leg has a small superficial wound laterally this continues to improve. The inflammation in the left leg is still improving however we have continuous surface layer epithelial loss posteriorly. There is no overt cellulitis in the amount of edema in both legs is really quite good. He states he is using his compression pumps on the left leg once a day for 5 out of 7 days 12/01/17; very small superficial areas on the right lateral leg continue to improve. Edema control in both legs is better today. He has continued loss of surface epithelialization and left posterior calf although I think this is better. We have been using silver alginate with large number of absorptive secondary dressings 4 layer on the left Unna boot on the right at his request. He tells me he is using his compression pumps once a day 12/08/17; he has no open area on the right leg is edema control is good here. On the left leg however he has  marked erythema and tenderness breakdown of skin. He has what appears to be a wrap injury just distal to the popliteal fossa. 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 There was swelling erythema and breakdown in the right leg. He has much worse skin breakdown in the left leg as well multiple open areas medially and posteriorly as well as laterally. He tells me he has been using his compression pumps but tells me he feels that the drainage out of his leg is worse when he uses a compression pumps. T be fair to him  he is been saying this o for a while however I don't know that I have really been listening to this. I wonder if the compression pumps are working properly 12/22/17;. Once again he arrives with severe erythema, weeping edema from the left greater than right leg. Noncompliance with compression pumps. New this visit he is complaining of pain on the lateral aspect of the right leg and the medial aspect of his right thigh. He apparently saw his cardiologist Dr. [name] who was ordered an echocardiogram area and I think this is a step in the right direction 12/25/17; started his doxycycline Monday night. There is still intense erythema of the right leg especially in the anterior thigh although there is less tenderness. The erythema around the wound on the right lateral calf also is less tender. He still complaining of pain in the left heel. His wounds are about the same right lateral left medial left lateral. Superficial but certainly not close to closure. He denies being systemically unwell no fever chills no abdominal pain no diarrhea 12/29/17; back in follow-up of his extensive right calf and right thigh cellulitis. I added  amoxicillin to cover possible doxycycline resistant strep. This seems to of done the trick he is in much less pain there is much less erythema and swelling. He has his echocardiogram at 11:00 this morning. X-ray of the left heel was also negative. 01/05/18; the patient arrived with his edema under much better control. Now that he is retired he is able to use his compression pumps daily and sometimes twice a day per the patient. He has a wound on the right leg the lateral wound looks better. Area on the left leg also looks a lot better. He has no evidence of cellulitis in his bilateral thighs I had a quick peak at his echocardiogram. He is in normal ejection fraction and normal left ventricular function. He has moderate pulmonary hypertension moderately reduced right ventricular function. One would have to wonder about chronic sleep apnea although he says he doesn't snore. He'll review the echocardiogram with his cardiologist. 01/12/18; the patient arrives with the edema in both legs under exemplary control. He is using his compression pumps daily and sometimes twice daily. His wound on the right lateral leg is just about closed. He still has some weeping areas on the posterior left calf and lateral left calf although everything is just about closed here as well. I have spoken with Aldean Baker who is the patient's nurse practitioner and infectious disease. She was concerned that the patient had not understood that the parenteral penicillin injections he was receiving for cellulitis prophylaxis was actually benefiting him. I don't think the patient actually saw that I would tend to agree we were certainly dealing with less infections although he had a serious one last month. 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 He is healed. He'll be placed in juxtalite compression wraps and increase his lymphedema pumps to twice daily. We will follow up again next week to  ensure there are no issues with the new regiment. 01/20/18-he is here for evaluation of bilateral lower extremity weeping edema. Yesterday he was placed in compression wrap to the right lower extremity and compression stocking to left lower shrubbery. He states he uses lymphedema pumps last night and again this morning and noted a blister to the left lower extremity. On exam he was noted to have drainage to the right lower extremity. He will be placed in [employer]  bilaterally and follow-up next week 01/26/18; patient was actually discharged a week ago to his own juxta light stockings only to return the next day with bilateral lower extremity weeping edema.he was placed in bilateral Unna boots. He arrives today with pain in the back of his left leg. There is no open area on the right leg however there is a linear/wrap injury on the left leg and weeping edema on the left leg posteriorly. I spoke with infectious disease about 10 days ago. They were disappointed that the patient elected to discontinue prophylactic intramuscular penicillin shots as they felt it was particularly beneficial in reducing the frequency of his cellulitis. I discussed this with the patient today. He does not share this view. He'll definitely need antibiotics today. Finally he is traveling to [place] and trauma leaving this Saturday and returning a week later and he does not travel with his pumps. He is going by car 01/30/18; patient was seen 4 days ago and brought back in today for review of cellulitis in the left leg posteriorly. I put him on amoxicillin this really hasn't helped as much as I might like. He is also worried because he is traveling to [facility] trauma by car. Finally we will be rewrapping him. There is no open area on the right leg over his left leg has multiple weeping areas as usual 02/09/18; The same wrap on for 10 days. He did not pick up the last doxycycline I prescribed for him. He apparently took 4 days worth  he already had. There is nothing open on his right leg and the edema control is really quite good. He's had damage in the left leg medially and laterally especially probably related to the prolonged use of Unna boots 02/12/18; the patient arrived in clinic today for a nurse visit/wrap change. He complained of a lot of pain in the left posterior calf. He is taking doxycycline that I previously prescribed for him. Unfortunately even though he used his stockings and apparently used to compression pumps twice a day he has weeping edema coming out of the lateral part of his right leg. This is coming from the lower anterior lateral skin area. 02/16/18; the patient has finished his doxycycline and will finish the amoxicillin 2 days. The area of cellulitis in the left calf posteriorly has resolved. He is no longer having any pain. He tells me he is using his compression pumps at least once a day sometimes twice. 02/23/18; the patient finished his doxycycline and Amoxil last week. On Friday he noticed a small erythematous circle about the size of a quarter on the left lower leg just above his ankle. This rapidly expanded and he now has erythema on the lateral and posterior part of the thigh. This is bright red. Also has an area on the dorsal foot just above his toes and a tender area just below the left popliteal fossa. He came off his prophylactic penicillin injections at his own insistence one or 2 months ago. This is obviously deteriorated since then 03/02/18; patient is on doxycycline and Amoxil. Culture I did last week of the weeping area on the back of his left calf grew group B strep. I have therefore renewed the amoxicillin 500 3 times a day for a further week. He has not been systemically unwell. Still complaining of an area of discomfort right under his left popliteal fossa. There is no open wound on the right leg. He tells me that he is using his pumps twice a  day on most days 03/09/18; patient arrives in  clinic today completing his amoxicillin today. The cellulitis on his left leg is better. Furthermore he tells me that he had intramuscular penicillin shots that his primary care office today. However he also states that the wrap on his right leg fell down shortly after leaving clinic last week. He developed a large blister that was present when he came in for a nurse visit later in the week and then he developed intense discomfort around this area.He tells me he is using his compression pumps 03/16/18; the patient has completed his doxycycline. The infectious part of this/cellulitis in the left heel area left popliteal area is a lot better. He has 2 open areas on the right calf. Still areas on the left calf but this is a lot better as well. 03/24/18; the patient arrives complaining of pain in the left popliteal area again. He thinks some of this is wrap injury. He has no open area on the right leg and really no open area on the left calf either except for the popliteal area. He claims to be compliant with the compression pumps 03/31/18; I gave him doxycycline last week because of cellulitis in the left popliteal area. This is a lot better although the surface epithelium is denuded off and response to this. He arrives today with uncontrolled edema in the right calf area as well as a fingernail injury in the right lateral calf. There is only a few open areas on the left 04/06/18; I gave him amoxicillin doxycycline over the last 2 weeks that the amoxicillin should be completing currently. He is not complaining of any pain or systemic symptoms. The only open areas see has is on the right lateral lower leg paradoxically I cannot see anything on the left lower leg. He tells me he is using his compression pumps twice a day on most days. Silver alginate to the wounds that are open under 4 layer compression 04/13/18; he completed antibiotics and has no new complaints. Using his compression pumps. Silver alginate that  anything that's opened 04/20/18; he is using his compression pumps religiously. Silver alginate 4 layer compression anything that's opened. He comes in today with no open wounds on the left leg but 3 on the right including a new one posteriorly. He has 2 on the right lateral and one on the right posterior. He likes Unna boots on the right leg for reasons that aren't really clear we had the usual 4 layer compression on the left. It may be necessary to move to the 4 layer compression on the right however for now I left them in the Unna boots 04/27/18; he is using his compression pumps at least once a day. He has still the wounds on the right lateral calf. The area right posteriorly has closed. He does not have an open wound on the left under 4 layer compression however on the dorsal left foot just proximal to the toes and the left third toe 2 small open areas were identified 05/11/18; he has not uses compression pumps. The areas on the right lateral calf have coalesced into one large wound necrotic surface. On the left side he has one small wound anteriorly however the edema is now weeping out of a large part of his left leg. He says he wasn't using his pumps because of the weeping fluid. I explained to him that this is the time he needs to pump more 05/18/18; patient states he is using his  compression pumps twice a day. The area on the right lateral large wound albeit superficial. On the left side he has innumerable number of small new wounds on the left calf particularly laterally but several anteriorly and medially. All these appear to have healthy granulated base these look like the remnants of blisters however they occurred under compression. The patient arrives in clinic today with his legs somewhat better. There is certainly less edema, less multiple open areas on the left calf and the right anterior leg looks somewhat better as well superficial and a little smaller. However he relates pain and  erythema over the last 3-4 days in the thigh and I looked at this today. He has not been systemically unwell no fever no chills no change in blood sugar values 05/25/18; comes in today in a better state. The severe cellulitis on his left leg seems better with the Keflex. Not as tender. He has not been systemically unwell Hard to find an open wound on the left lower leg using his compression pumps twice a day The confluent wounds on his right lateral calf somewhat better looking. These will ultimately need debridement I didn't do this today. 06/01/18; the severe cellulitis on the left anterior thigh has resolved and he is completed his Keflex. There is no open wound on the left leg however there is a superficial excoriation at the base of the third toe dorsally. Skin on the bottom of his left foot is macerated looking. The left the wounds on the lateral right leg actually looks some better although he did require debridement of the top half of this wound area with an open curet 06/09/18 on evaluation today patient appears to be doing poorly in regard to his right lower extremity in particular this appears to likely be infected he has very thick purulent discharge along with a bright green tent to the discharge. This makes me concerned about the possibility of pseudomonas. He's also having increased discomfort at this point on evaluation. Fortunately there does not appear to be any evidence of infection spreading to the other location at this time. 06/16/18 on evaluation today patient appears to actually be doing fairly well. His ulcer has actually diminished in size quite significantly at this point which is good news. Nonetheless he still does have some evidence of infection he did see infectious disease this morning before coming here for his appointment. I did review the results of their evaluation and their note today. They did actually have him discontinue the Cipro and initiate treatment with  linezolid at this time. He is doing this for the next seven days and they recommended a follow-up in four months with them. He is the keep a log of the need for intermittent antibiotic therapy between now and when he falls back up with infectious disease. This will help them gaze what exactly they need to do to try and help them out. 06/23/18; the patient arrives today with no open wounds on the left leg and left third toe healed. He is been using his compression pumps twice a day. On the right lateral leg he still has a sizable wound but this is a lot better than last time I saw this. 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 Has been using silver alginate under 4 layer compression 06/30/18; the only open wound he has is on the right lateral leg and this  looks healthy. No debridement is required. We have been using silver alginate. He does not have an open wound on the left leg. There is apparently some drainage from the dorsal proximal third toe on the left although I see no open wound here. 07/03/18 on evaluation today patient was actually here just for a nurse visit rapid change. However when he was here on Wednesday for his rat change due to having been healed on the left and then developing blisters we initiated the wrap again knowing that he would be back today for [place] to reevaluate and see were at. Unfortunately he has developed some cellulitis into the proximal portion of his right lower extremity even into the region of his thigh. 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 He was placed on one as what at that point. Nonetheless he is done with that and has been tolerating it well otherwise. Doxycycline which in the past really did not seem to be effective for him. Nonetheless I think the best option may be for [place] to definitely reinitiate the  antibiotics for a longer period of time. 07/07/18; since I last saw this patient a week ago he has had a difficult time. At that point he did not have an open wound on his left leg. We transitioned him into juxta light stockings. He was apparently in the clinic the next day with blisters on the left lateral and left medial lower calf. He also had weeping edema fluid. He was put back into a compression wrap. He was also in the clinic on Friday with intense erythema in his right thigh. Per the patient he was started on Bactrim however that didn't work at all in terms of relieving his pain and swelling. He has taken 3 doxycycline that he had left over from last time and that seems to of helped. He has blistering on the right thigh as well. 07/14/18; the erythema on his right thigh has gotten better with doxycycline that he is finishing. 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 Presumably this cellulitis in the thigh was not related to that although I think this is a bit concerning going forward. He still has an area on the right lateral calf the blister on the right medial calf just below the knee that was discussed above. On the left 2 small open areas left medial and left lateral. Edema control is adequate. He is using his compression pumps twice a day 07/20/18; continued improvement in the condition of both legs especially the edema in his bilateral thighs. He tells me he is been losing weight through a combination of diet and exercise. He is using his compression pumps twice a day. So overall she made to the remaining wounds 07/27/2018; continued improvement in condition of both legs. His edema is well controlled. The area on the right lateral leg is just about closed he had one blisters show up on the medial left upper calf. We have him in 4 layer compression. He is going on a 10-day trip to [place].  He will be driving. He wants to wear Unna boots because of the lessening amount of constriction. He will not use compression pumps while he is away 08/05/18 on evaluation today patient actually appears to be doing decently well all things considered in regard to his bilateral lower extremities. The  worst ulcer is actually only posterior aspect of his left lower extremity with a four layer compression wrap cut into his leg a couple weeks back. He did have a trip and actually had [employer] for the trip that he is worn since he was last here. Nonetheless he feels like the [employer] actually do better for him his swelling is up a little bit but he also with his trip was not taking his Lasix on a regular set schedule like he was supposed to be. He states that obviously the reason being that he cannot drive and keep going without having to urinate too frequently which makes it difficult. He did not have his pumps with him while he was away either which I think also maybe playing a role here too. 08/13/2018; the patient only has a small open wound on the right lateral calf which is a big improvement in the last month or 2. He also has the area posteriorly just below the posterior fossa on the left which I think was a wrap injury from several weeks ago. He has no current evidence of cellulitis. He tells me he is back into his compression pumps twice a day. He also tells me that while he was at the laundromat somebody stole a section of his extremitease stockings 08/20/2018; back in the clinic with a much improved state. He only has small areas on the right lateral mid calf which is just about healed. This was is more substantial area for quite a prolonged period of time. He has a small open area on the left anterior tibia. The area on the posterior calf just below the popliteal fossa is closed today. He is using his compression pumps twice a day 08/28/2018; patient has no open wound on the right  leg. He has a smattering of open areas on the calf with some weeping lymphedema. More problematically than that it looks as though his wraps of slipped down in his usual he has very angry upper area of edema just below the right medial knee and on the right lateral calf. He has no open area on his feet. The patient is traveling to [facility] next week. I will send him in an antibiotic. We will continue to wrap the right leg. We ordered extremitease stockings for him last week and I plan to transition the right leg to a stocking when he gets home which will be in 10 days time. As usual he is very reluctant to take his pumps with him when he travels 09/07/2018; patient returns from [facility]. He shows me a picture of his left leg in the mid part of his trip last week with intense fire engine erythema. The picture look bad enough I would have considered sending him to the hospital. Instead he went to the wound care center in [facility]. They did not prescribe him antibiotics but he did take some doxycycline he had leftover from a previous visit. I had given him trimethoprim sulfamethoxazole before he left this did not work according to the patient. This is resulted in some improvement fortunately. He comes back with a large wound on the left posterior calf. Smaller area on the left anterior tibia. Denuded blisters on the dorsal left foot over his toes. Does not have much in the way of wounds on the right leg although he does have a very tender area on the right posterior area just below the popliteal fossa also suggestive of infection.  He promises me he is back on his pumps twice a day 09/15/2018; the intense cellulitis in his left lower calf is a lot better. The wound area on the posterior left calf is also so better. However he has reasonably extensive wounds on the dorsal aspect of his second and third toes and the proximal foot just at the base of the toes. There is nothing open on the  right leg 09/22/2018; the patient has excellent edema control in his legs bilaterally. He is using his external compression pumps twice a day. He has no open area on the right leg and only the areas in the left foot dorsally second and third toe area on the left side. He does not have any signs of active cellulitis. 10/06/2018; the patient has good edema control bilaterally. He has no open wound on the right leg. There is a blister in the posterior aspect of his left calf that we had to deal with today. He is using his compression pumps twice a day. There is no signs of active cellulitis. We have been using silver alginate to the wound areas. He still has vulnerable areas on the base of his left first second toes dorsally He has a his extremities stockings and we are going to transition him today into the stocking on the right leg. He is cautioned that he will need to continue to use the compression pumps twice a day. If he notices uncontrolled edema in the right leg he may need to go to 3 times a day. 10/13/2018; the patient came in for a nurse check on Friday he has a large flaccid blister on the right medial calf just below the knee. We unroofed this. He has this and a new area underneath the posterior mid calf which was undoubtedly a blister as well. He also has several small areas on the right which is the area we put his extremities stocking on. 10/19/2018; the patient went to see infectious disease this morning I am not sure if that was a routine follow-up in any case the doxycycline I had given him was discontinued and started on linezolid. He has not started this. It is easy to look at his left calf and the inflammation and think this is cellulitis however he is very tender in the tissue just below the popliteal fossa and I have no doubt that there is infection going on here. He states the problem he is having is that with the compression pumps the edema goes down and then starts walking the  wrap falls down. We will see if we can adhere this. He has 1 or 2 minuscule open areas on the right still areas that are weeping on the posterior left calf, the base of his left second and third toes 10/26/18; back today in clinic with quite of skin breakdown in his left anterior leg. This may have been infection the area below the popliteal fossa seems a lot better however tremendous epithelial loss on the left anterior mid tibia area over quite inexpensive tissue. He has 2 blisters on the right side but no other open wound here. 10/29/2018; came in urgently to see [place] today and we worked him in for review. He states that the 4 layer compression on the right leg caused pain he had to cut it down to roughly his mid calf this caused swelling above the wrap and he has blisters and skin breakdown today. As a result of the pain he has not been using  his pumps. Both legs are a lot more edematous and there is a lot of weeping fluid. 11/02/18; arrives in clinic with continued difficulties in the right leg> left. Leg is swollen and painful. multiple skin blisters and new open areas especially laterally. He has not been using his pumps on the right leg. 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 He is not systemically unwell. 11/09/2018; the patient claims he is being compliant with his pumps. He is finished the doxycycline I gave him last week. Culture I did of the wound on the right lateral leg showed a few very resistant methicillin staph aureus. This was resistant to doxycycline. Nevertheless he states the pain in the leg is a lot better which makes me wonder if the cultured organism was not really what was causing the problem nevertheless this is a very dangerous organism to be culturing out of any wound. His right leg is still a lot larger than the left. He is using an Radio broadcast assistant on this area, he blames a 4-layer compression for causing the original skin  breakdown which I doubt is true however I cannot talk him out of it. We have been using silver alginate to all of these areas which were initially blisters 11/16/2018; patient is being compliant with his external compression pumps at twice a day. Miraculously he arrives in clinic today with absolutely no open wounds. He has better edema control on the left where he has been using 4 layer compression versus wound of wounds on the right and I pointed this out to him. There is no inflammation in the skin in his lower legs which is also somewhat unusual for him. There is no open wounds on the dorsal left foot. He has extremitease stockings at home and I have asked him to bring these in next week. 11/25/18 patient's lower extremity on examination today on the left appears for the most part to be wound free. He does have an open wound on the lateral aspect of the right lower extremity but this is minimal compared to what I've seen in past. He does request that we go ahead and wrap the left leg as well even though there's nothing open just so hopefully it will not reopen in short order. 1/28; patient has superficial open wounds on the right lateral calf left anterior calf and left posterior calf. His edema control is adequate. 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 We have been using silver alginate as the primary dressing. He claims compliance with his compression pumps 2/4; patient has superficial open wounds on numerous areas of his left calf and again one on the left dorsal foot. The areas on the right lateral calf have healed. The cellulitis that I gave him doxycycline for last week is also resolved this was mostly on the left anterior calf just below the tibial tuberosity. His edema looks fairly well-controlled. He tells me he went to see his primary doctor today and had blood work ordered 2/11; once again he has  several open areas on the left calf left tibial area. Most of these are small and appear to have healthy granulation. He does not have anything open on the right. The edema and control in his thighs is pretty good which is usually a good indication he has been using his pumps as requested. 2/18; he continues to have several small  areas on the left calf and left tibial area. Most of these are small healthy granulation. We put him in his stocking on the right leg last week and he arrives with a superficial open area over the right upper tibia and a fairly large area on the right lateral tibia in similar condition. His edema control actually does not look too bad, he claims to be using his compression pumps twice a day 2/25. Continued small areas on the left calf and left tibial area. New areas especially on the right are identified just below the tibial tuberosity and on the right upper tibia itself. There are also areas of weeping edema fluid even without an obvious wound. He does not have a considerable degree of lymphedema but clearly there is more edema here than his skin can handle. He states he is using the pumps twice a day. We have an Unna boot on the right and 4 layer compression on the left. 3/3; he continues to have an area on the right lateral calf and right posterior calf just below the popliteal fossa. There is a fair amount of tenderness around the wound on the popliteal fossa but I did not see any evidence of cellulitis, could just be that the wrap came down and rubbed in this area. He does not have an open area on the left leg however there is an area on the left dorsal foot at the base of the third toe We have been using silver alginate to all wound areas 3/10; he did not have an open area on his left leg last time he was here a week ago. T oday he arrives with a horizontal wound just below the tibial tuberosity and an area on the left lateral calf. He has intense erythema and  tenderness in this area. The area is on the right lateral calf and right posterior calf better than last week. We have been using silver alginate as usual 3/18 - Patient returns with 3 small open areas on left calf, and 1 small open area on right calf, the skin looks ok with no significant erythema, he continues the UNA boot on right and 4 layer compression on left. The right lateral calf wound is closed , the right posterior is small area. we will continue silver alginate to the areas. 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 He is been tolerating the dressing changes without complication. Fortunately she has made excellent improvement in regard to the overall status of his wounds. Unfortunately every time we cease wrapping him he ends up reopening in causing more significant issues at that point. Again I'm unsure of the best direction to take although I think the lymphedema clinic may be appropriate for him. 02/03/19 on evaluation today patient appears to be doing well in regard to the wounds that we saw him for last week unfortunately he has a new area on the proximal portion of his right medial/posterior lower extremity where the wrap somewhat slowed down and caused swelling and a blister to rub and open. Unfortunately this is the only opening that he has on either leg at this point. 02/17/19 on evaluation today patient's bilateral lower extremities appear to be doing well. He still completely healed in regard to the left lower extremity. In regard to the right lower extremity  the area where the wrap and slid down and caused the blister still seems to be slightly open although this is dramatically better than during the last evaluation two weeks ago. I'm very pleased with the way this stands  overall. 03/03/19 on evaluation today patient appears to be doing well in regard to his right lower extremity in general although he did have a new blister open this does not appear to be showing any evidence of active infection at this time. Fortunately there's No fevers, chills, nausea, or vomiting noted at this time. Overall I feel like he is making good progress it does feel like that the right leg will we perform the [employer] seems to do with a bit better than three layer wrap on the left which slid down on him. We may switch to doing bilateral in the book wraps. 5/4; I have not seen Mr. Poinsett in quite some time. According to our case manager he did not have an open wound on his left leg last week. He had 1 remaining wound on the right posterior medial calf. He arrives today with multiple openings on the left leg probably were blisters and/or wrap injuries from Unna boots. I do not think the Unna boot's will provide adequate compression on the left. I am also not clear about the frequency he is using the compression pumps. 03/17/19 on evaluation today patient appears to be doing excellent in regard to his lower extremities compared to last week's evaluation apparently. He had gotten significantly worse last week which is unfortunate. The [employer] wrap on the left did not seem to do very well for him at all and in fact it didn't control his swelling significantly enough he had an additional outbreak. Subsequently we go back to the four layer compression wrap on the left. This is good news. At least in that he is doing better and the wound seem to be killing him. He still has not heard anything from the lymphedema clinic. 03/24/19 on evaluation today patient actually appears to be doing much better in regard to his bilateral lower Trinity as compared to last week when I saw him. Fortunately there's no signs of active infection at this time. He has been tolerating the dressing changes without  complication. Overall I'm extremely pleased with the progress and appearance in general. 04/07/19 on evaluation today patient appears to be doing well in regard to his bilateral lower extremities. His swelling is significantly down from where it was previous. With that being said he does have a couple blisters still open at this point but fortunately nothing that seems to be too severe and again the majority of the larger openings has healed at this time. 04/14/19 on evaluation today patient actually appears to be doing quite well in regard to his bilateral lower extremities in fact I'm not even sure there's anything significantly open at this time at any site. Nonetheless he did have some trouble with these wraps where they are somewhat irritating him secondary to the fact that he has noted that the graph wasn't too close down to the end of this foot in a little bit short as well up to his knee. Otherwise things seem to be doing quite well. 04/21/19 upon evaluation today patient's wound bed actually showed evidence of being completely healed in regard to both lower extremities which is excellent news. There does not appear to be any signs of active infection which is also good news. I'm very pleased in this  regard. No fevers, chills, nausea, or vomiting noted at this time. 04/28/19 on evaluation today patient appears to be doing a little bit worse in regard to both lower extremities on the left mainly due to the fact that when he went infection disease the wrap was not wrapped quite high enough he developed a blister above this. On the right he is a small open area of nothing too significant but again this is continuing to give him some trouble he has been were in the Velcro compression that he has at home. 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 He's been tolerating the dressing changes without complication. Fortunately there's no signs of  active infection at this time. No fevers, chills, nausea, or vomiting noted at this time. We have been trying to get an appointment with her lymphedema clinic in [facility] but unfortunately nobody can get them on phone with not been able to even fax information over the patient likewise is not been able to get in touch with them. Overall I'm not sure exactly what's going on here with to reach out again today. 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 Still having a lot of drainage unfortunately. He tells me especially in the left but even on the right. There's no signs of active infection which is good news we've been using so ratcheted up to this point. 05/19/19 on evaluation today patient actually appears to be doing quite well with regard to his left lower extremity which is great news. Fortunately in regard to the right lower extremity has an issues with his wrap and he subsequently did remove this from what I'm understanding. Nonetheless long story short is what he had rewrapped once he removed it subsequently had maggots underneath this wrap whenever he came in for evaluation today. With that being said they were obviously completely cleaned away by the nursing staff. The visit today which is excellent news. However he does appear to potentially have some infection around the right ankle region where the maggots were located as well. He will likely require anabiotic therapy today. 05/26/19 on evaluation today patient actually appears to be doing much better in regard to his bilateral lower extremities. I feel like the infection is under much better control. With that being said there were maggots noted when the wrap was removed yet again today. Again this could have potentially been left over from previous although at this time there does not appear to be any signs of significant drainage there  was obviously on the wrap some drainage as well this contracted gnats or otherwise. Either way I do not see anything that appears to be doing worse in my pinion and in fact I think his drainage has slowed down quite significantly likely mainly due to the fact to his infection being under better control. 06/02/2019 on evaluation today patient actually appears to be doing well with regard to his bilateral lower extremities there is no signs of active infection at this time which is great news. With that being said he does have several open areas more so on the right than the left but nonetheless these are all significantly better than previously noted. 06/09/2019 on evaluation today patient actually appears to be doing well. His wrap stayed up and he did not cause any problems he had more drainage on the right compared to  the left but overall I do not see any major issues at this time which is great news. 06/16/2019 on evaluation today patient appears to be doing excellent with regard to his lower extremities the only area that is open is a new blister that can have opened as of today on the medial ankle on the left. Other than this he really seems to be doing great I see no major issues at this point. 06/23/2019 on evaluation today patient appears to be doing quite well with regard to his bilateral lower extremities. In fact he actually appears to be almost completely healed there is a small area of weeping noted of the right lower extremity just above the ankle. Nonetheless fortunately there is no signs of active infection at this time which is good news. No fevers, chills, nausea, vomiting, or diarrhea. 8/24; the patient arrived for a nurse visit today but complained of very significant pain in the left leg and therefore I was asked to look at this. Noted that he did not have an open area on the left leg last week nevertheless this was wrapped. The patient states that he is not been able to put his  compression pumps on the left leg because of the discomfort. He has not been systemically unwell 06/30/2019 on evaluation today patient unfortunately despite being excellent last week is doing much worse with regard to his left lower extremity today. In fact he had to come in for a nurse on Monday where his left leg had to be rewrapped due to excessive weeping Dr. Leanord Hawking placed him on doxycycline at that point. Fortunately there is no signs of active infection Systemically at this time which is good news. 07/07/2019 in regard to the patient's wounds today he actually seems to be doing well with his right lower extremity there really is nothing open or draining at this point this is great news. Unfortunately the left lower extremity is given him additional trouble at this time. There does not appear to be any signs of active infection nonetheless he does have a lot of edema and swelling noted at this point as well as blistering all of which has led to a much more poor appearing leg at this time compared to where it was 2 weeks ago when it was almost completely healed. Obviously this is a little discouraging for the patient. He is try to contact the lymphedema clinic in [place] he has not been able to get through to them. 07/14/2019 on evaluation today patient actually appears to be doing slightly better with regard to his left lower extremity ulcers. Overall I do feel like at least at the top of the wrap that we have been placing this area has healed quite nicely and looks much better. The remainder of the leg is showing signs of improvement. Unfortunately in the thigh area he still has an open region on the left and again on the right he has been utilizing just a Band-Aid on an area that also opened on the thigh. Again this is an area that were not able to wrap although we did do an Ace wrap to provide some compression that something that obviously is a little less effective than the compression wraps  we have been using on the lower portion of the leg. He does have an appointment with the lymphedema clinic in [facility] on Friday. 07/21/2019 on evaluation today patient appears to be doing better with regard to his lower extremity ulcers. He has been tolerating  the dressing changes without complication. Fortunately there is no signs of active infection at this time. No fevers, chills, nausea, vomiting, or diarrhea. I did receive the paperwork from the physical therapist at the lymphedema clinic in [place]. Subsequently I signed off on that this morning and sent that back to him for further progression with the treatment plan. 07/28/2019 on evaluation today patient appears to be doing very well with regard to his right lower extremity where I do not see any open wounds at this point. Fortunately he is feeling great as far as that is concerned as well. In regard to the left lower extremity he has been having issues with still several areas of weeping and edema although the upper leg is doing better his lower leg still I think is going require the compression wrap at this time. No fevers, chills, nausea, vomiting, or diarrhea. 08/04/2019 on evaluation today patient unfortunately is having new wounds on the right lower extremity. Again we have been using Unna boot wrap on that side. We switched him to using his juxta lite wrap at home. With that being said he tells me he has been using it although his legs extremely swollen and to be honest really does not appear that he has been. I cannot know that for sure however. Nonetheless he has multiple new wounds on the right lower extremity at this time. Obviously we will have to see about getting this rewrapped for him today. 08/11/2019 on evaluation today patient appears to be doing fairly well with regard to his wounds. He has been tolerating the dressing changes including the compression wraps without complication. He still has a lot of edema in his  upper thigh regions bilaterally he is supposed to be seeing the lymphedema clinic on the 15th of this month once his wraps arrive for the upper part of his legs. 08/18/2019 on evaluation today patient appears to be doing well with regard to his bilateral lower extremities at this point. He has been tolerating the dressing changes without complication. Fortunately there is no signs of active infection which is also good news. He does have a couple weeping areas on the first and second toe of the right foot he also has just a small area on the left foot upper leg and a small area on the left lower leg but overall he is doing quite well in my opinion. He is supposed to be getting his wraps shortly in fact tomorrow and then subsequently is seeing the lymphedema clinic next Wednesday on the 21st. Of note he is also leaving on the 25th to go on vacation for a week to the beach. For that reason and since there is some uncertainty about what there can be doing at lymphedema clinic next Wednesday I am get a make an appointment for next Friday here for [place] to see what we need to do for him prior to him leaving for vacation. 10/23; patient arrives in considerable pain predominantly in the upper posterior calf just distal to the popliteal fossa also in the wound anteriorly above the major wound. This is probably cellulitis and he has had this recurrently in the past. He has no open wound on the right side and he has had an Radio broadcast assistant in that area. Finally I note that he has an area on the left posterior calf which by enlarge is mostly epithelialized. This protrudes beyond the borders of the surrounding skin in the setting of dry scaly skin and lymphedema. The patient  is leaving for [facility] on Sunday. Per his longstanding pattern, he will not take his compression pumps with him predominantly out of fear that they will be stolen. He therefore asked that we put a Unna boot back on the right leg. He will also  contact the wound care center in [facility] to see if they can change his dressing in the mid week. 11/3; patient returned from his vacation to [facility]. He was seen on 1 occasion at their wound care center. They did a 2 layer compression system as they did not have our 4-layer wrap. I am not completely certain what they put on the wounds. They did not change the Unna boot on the right. The patient is also seeing a lymphedema specialist physical therapist in [place]. It appears that he has some compression sleeve for his thighs which indeed look quite a bit better than I am used to seeing. He pumps over these with his external compression pumps. 11/10; the patient has a new wound on the right medial thigh otherwise there is no open areas on the right. He has an area on the left leg posteriorly anteriorly and medially and an area over the left second toe. We have been using silver alginate. He thinks the injury on his thigh is secondary to friction from the compression sleeve he has. 11/17; the patient has a new wound on the right medial thigh last week. He thinks this is because he did not have a underlying stocking for his thigh juxta lite apparatus. He now has this. The area is fairly large and somewhat angry but I do not think he has underlying cellulitis. He has a intact blister on the right anterior tibial area. Small wound on the right great toe dorsally Small area on the medial left calf. 11/30; the patient does not have any open areas on his right leg and we did not take his juxta lite stocking off. However he states that on Friday his compression wrap fell down lodging around his upper mid calf area. As usual this creates a lot of problems for him. He called urgently today to be seen for a nurse visit however the nurse visit turned into a provider visit because of extreme erythema and pain in the left anterior tibia extending laterally and posteriorly. The area that is  problematic is extensive 10/06/2019 upon evaluation today patient actually appears to be doing poorly in regard to his left lower extremity. He Dr. Leanord Hawking did place him on doxycycline this past Monday apparently due to the fact that he was doing much worse in regard to this left leg. Fortunately the doxycycline does seem to be helping. Unfortunately we are still having a very difficult time getting his edema under any type of control in order to anticipate discharge at some point. The only way were really able to control his lymphedema really is with compression wraps and that has only even seemingly temporary. He has been seeing a lymphedema clinic they are trying to help in this regard but still this has been somewhat frustrating in general for the patient. 10/13/19 on evaluation today patient appears to be doing excellent with regard to his right lower extremity as far as the wounds are concerned. His swelling is still quite extensive unfortunately. He is still having a lot of drainage from the thigh areas bilaterally which is unfortunate. He's been going to lymphedema clinic but again he still really does not have this edema under control as far  as his lower extremities are concern. With regard to his left lower extremity this seems to be improving and I do believe the doxycycline has been of benefit for him. He is about to complete the doxycycline. 10/20/2019 on evaluation today patient appears to be doing poorly in regard to his bilateral lower extremities. More in the right thigh he has a lot of irritation at this site unfortunately. In regard to the left lower extremity the wrap was not quite as high it appears and does seem to have caused him some trouble as well. Fortunately there is no evidence of systemic infection though he does have some blue-green drainage which has me concerned for the possibility of Pseudomonas. He tells me he is previously taking Cipro without complications and he really  does not care for Levaquin however due to some of the side effects he has. He is not allergic to any medications specifically antibiotics that were aware of. 10/27/2019 on evaluation today patient actually does appear to be for the most part doing better when compared to last week's evaluation. With that being said he still has multiple open wounds over the bilateral lower extremities. He actually forgot to start taking the Cipro and states that he still has the whole bottle. He does have several new blisters on left lower extremity today I think I would recommend he go ahead and take the Cipro based on what I am seeing at this point. 12/30-Patient comes at 1 week visit, 4 layer compression wraps on the left and Unna boot on the right, primary dressing Xtrasorb and silver alginate. Patient is taking his Cipro and has a few more days left probably 5-6, and the legs are doing better. He states he is using his compressions devices which I believe he has 11/10/2019 on evaluation today patient actually appears to be much better than last time I saw him 2 weeks ago. His wounds are significantly improved and overall I am very pleased in this regard. Fortunately there is no signs of active infection at this time. He is just a couple of days away from completing Cipro. Overall his edema is much better he has been using his lymphedema pumps which I think is also helping at this point. 11/17/2019 on evaluation today patient appears to be doing excellent in regard to his wounds in general. His legs are swollen but not nearly as much as they have been in the past. Fortunately he is tolerating the compression wraps without complication. No fevers, chills, nausea, vomiting, or diarrhea. He does have some erythema however in the distal portion of his right lower extremity specifically around the forefoot and toes there is a little bit of warmth here as well. 11/24/2019 on evaluation today patient appears to be doing well  with regard to his right lower extremity I really do not see any open wounds at this point. His left lower extremity does have several open areas and his right medial thigh also is open. Other than this however overall the patient seems to be making good progress and I am very pleased at this point. 12/01/2019 on evaluation today patient appears to be doing poorly at this point in regard to his left lower extremity has several new blisters despite the fact that we have him in compression wraps. In fact he had a 4-layer compression wrap, his upper thigh wrapped from lymphedema clinic, and a juxta light over top of the 4 layer compression wrap the lymphedema clinic applied and despite all this  he still develop blisters underneath. Obviously this does have me concerned about the fact that unfortunately despite what we are doing to try to get wounds healed he continues to have new areas arise I do not think he is ever good to be at the point where he can realistically just use wraps at home to keep things under control. Typically when we heal him it takes about 1-2 days before he is back in the clinic with severe breakdown and blistering of his lower extremities bilaterally. This is happened numerous times in the past. Unfortunately I think that we may need some help as far as overall fluid overload to kind of limit what we are seeing and get things under better control. 12/08/2019 on evaluation today patient presents for follow-up concerning his ongoing bilateral lower extremity edema. Unfortunately he is still having quite a bit of swelling the compression wraps are controlling this to some degree but he did see Dr. [name] his cardiologist I do have that available for review today as far as the appointment was concerned that was on 12/06/2019. Obviously that she has been 2 days ago. The patient states that he is only been taking the Lasix 80 mg 1 time a day he had told me previously he was taking this twice a  day. Nonetheless Dr. [name] recommended this be up to 80 mg 2 times a day for the patient as he did appear to be fluid overloaded. With that being said the patient states he did this yesterday and he was unable to go anywhere or do anything due to the fact that he was constantly having to urinate. Nonetheless I think that this is still good to be something that is important for him as far as trying to get his edema under control at all things that he is going to be able to just expect his wounds to get under control and things to be better without going through at least a period of time where he is trying to stabilize his fluid management in general and I think increasing the Lasix is likely the first step here. It was also mentioned the possibility that the patient may require metolazone. With that being said he wanted to have the patient take Lasix twice a day first and then reevaluating 2 months to see where things stand. 12/15/2019 upon evaluation today patient appears to be doing regard to his legs although his toes are showing some signs of weeping especially on the left at this point to some degree on the right. There does not appear to be any signs of active infection and overall I do feel like the compression wraps are doing well for him but he has not been able to take the Lasix at home and the increased dose that Dr. [name] recommended. He tells me that just not go to be feasible for him. Nonetheless I think in this case he should probably send a message to Dr. [name] in order to discuss options from the standpoint of possible admission to get the fluid off or otherwise going forward. 12/22/2019 upon evaluation today patient appears to be doing fairly well with regard to his lower extremities at this point. In fact he would be doing excellent if it was not for the fact that his right anterior thigh apparently had an allergic reaction to adhesive tape that he used. The wound itself that we have  been monitoring actually appears to be healed. There is a lot of irritation at this  point. 12/29/2019 upon evaluation today patient appears to be doing well in regard to his lower extremities. His left medial thigh is open and somewhat draining today but this is the only region that is open the right has done much better with the treatment utilizing the steroid cream that I prescribed for him last week. Overall I am pleased in that regard. Fortunately there is no signs of active infection at this time. No fevers, chills, nausea, vomiting, or diarrhea. 01/05/2020 upon evaluation today patient appears to be doing more poorly in regard to his right lower extremity at this point upon evaluation today. Unfortunately he continues to have issues in this regard and I think the biggest issue is controlling his edema. This obviously is not very well controlled at this point is been recommended that he use the Lasix twice a day but he has not been able to do that. Unfortunately I think this is leading to an issue where honestly he is not really able to effectively control his edema and therefore the wounds really are not doing significantly better. I do not think that he is going to be able to keep things under good control unless he is able to control his edema much better. I discussed this again in great detail with him today. 01/12/2020 good news is patient actually appears to be doing quite well today at this point. He does have an appointment with lymphedema clinic tomorrow. His legs appear healed and the toe on the left is almost completely healed. In general I am very pleased with how things stand at this point. 01/19/2020 upon evaluation today patient appears to actually be doing well in regard to his lower extremities there is nothing open at this point. Fortunately he has done extremely well more recently. Has been seeing lymphedema clinic as well. With that being said he has Velcro wraps for his lower legs  as well as his upper legs. The only wound really is on his toe which is the right great toe and this is barely anything even there. With all that being said I think it is good to be appropriate today to go ahead and switch him over to the Velcro compression wraps. 01/26/2020 upon evaluation today patient appears to be doing worse with regard to his lower extremities after last week switch him to Velcro compression wraps. Unfortunately he lasted less than 24 hours he did not have the sock portion of his Velcro wrap on the left leg and subsequently developed a blister underneath the Velcro portion. Obviously this is not good and not what we were looking for at this point. He states the lymphedema clinic did tell him to wear the wrap for 23 hours and take him off for 1 I am okay with that plan but again right now we got a get things back under control again he may have some cellulitis noted as well. 02/02/2020 upon evaluation today patient unfortunately appears to have several areas of blistering on his bilateral lower extremities today mainly on the feet. His legs do seem to be doing somewhat better which is good news. Fortunately there is no evidence of active infection at this time. No fevers, chills, nausea, vomiting, or diarrhea. 02/16/2020 upon evaluation today patient appears to be doing well at this time with regard to his legs. He has a couple weeping areas on his toes but for the most part everything is doing better and does appear to be sealed up on his legs which is  excellent news. We can continue with wrapping him at this point as he had every time we discontinue the wraps he just breaks out with new wounds. There is really no point in is going forward with this at this point. 03/08/2020 upon evaluation today patient actually appears to be doing quite well with regard to his lower extremity ulcers. He has just a very superficial and really almost nonexistent blister on the left lower extremity he  has in general done very well with the compression wraps. With that being said I do not see any signs of infection at this time which is good news. 03/29/2020 upon evaluation today patient appears to be doing well with regard to his wounds currently except for where he had several new areas that opened up due to some of the wrap slipping and causing him trouble. He states he did not realize they had slipped. Nonetheless he has a 1 area on the right and 3 new areas on the left. Fortunately there is no signs of active infection at this time which is great news. 04/05/2020 upon evaluation today patient actually appears to be doing quite well in general in regard to his legs currently. Fortunately there is no signs of active infection at this time. No fevers, chills, nausea, vomiting, or diarrhea. He tells me next week that he will actually be seen in the lymphedema clinic on Thursday at 10 AM I see him on Wednesday next week. 04/12/2020 upon evaluation today patient appears to be doing very well with regard to his lower extremities bilaterally. In fact he does not appear to have any open wounds at this point which is good news. Fortunately there is no signs of active infection at this time. No fevers, chills, nausea, vomiting, or diarrhea. 04/19/2020 upon evaluation today patient appears to be doing well with regard to his wounds currently on the bilateral lower extremities. There does not appear to be any signs of active infection at this time. Fortunately there is no evidence of systemic infection and overall very pleased at this point. Nonetheless after I held him out last week he literally had blisters the next morning already which swelled up with him being right back here in the clinic. Overall I think that he is just not can be able to be discharged with his legs the way they are he is much to volume overloaded as far as fluid is concerned and that was discussed with him today of also discussed this but  should try the clinic nurse manager as well as Dr. Leanord Hawking. 04/26/2020 upon evaluation today patient appears to be doing better with regard to his wounds currently. He is making some progress and overall swelling is under good control with the compression wraps. Fortunately there is no evidence of active infection at this time. 05/10/2020 on evaluation today patient appears to be doing overall well in regard to his lower extremities bilaterally. He is Tolerating the compression wraps without complication and with what we are seeing currently I feel like that he is making excellent progress. There is no signs of active infection at this time. 05/24/2020 upon evaluation today patient appears to be doing well in regard to his legs. The swelling is actually quite a bit down compared to where it has been in the past. Fortunately there is no sign of active infection at this time which is also good news. With that being said he does have several wounds on his toes that have opened up at this  point. 05/31/2020 upon evaluation today patient appears to be doing well with regard to his legs bilaterally where he really has no significant fluid buildup at this point overall he seems to be doing quite well. Very pleased in this regard. With regard to his toes these also seem to be drying up which is excellent. We have continue to wrap him as every time we tried as a transition to the juxta light wraps things just do not seem to get any better. 06/07/2020 upon evaluation today patient appears to be doing well with regard to his right leg at this point. Unfortunately left leg has a lot of blistering he tells me the wrap started to slide down on him when he tried to put his other Velcro wrap over top of it to help keep things in order but nonetheless still had some issues. 06/14/2020 on evaluation today patient appears to be doing well with regard to his lower extremity ulcers and foot ulcers at this point. I feel like  everything is actually showing signs of improvement which is great news overall there is no signs of active infection at this time. No fevers, chills, nausea, vomiting, or diarrhea. 06/21/2020 on evaluation today patient actually appears to be doing okay in regard to his wounds in general. With that being said the biggest issue I see is on his right foot in particular the first and second toe seem to be doing a little worse due to the fact this is staying very wet. I think he is probably getting need to change out his dressings a couple times in between each week when we see him in regard to his toes in order to keep this drier based on the location and how this is proceeding. 06/28/2020 on evaluation today patient appears to be doing a little bit more poorly overall in regard to the appearance of the skin I am actually somewhat concerned about the possibility of him having a little bit of an infection here. We discussed the course of potentially giving him a doxycycline prescription which he is taken previously with good result. With that being said I do believe that this is potentially mild and at this point easily fixed. I just do not want anything to get any worse. 07/12/2020 upon evaluation today patient actually appears to be making some progress with regard to his legs which is great news there does not appear to be any evidence of active infection. Overall very pleased with where things stand. 07/26/2020 upon evaluation today patient appears to be doing well with regard to his leg ulcers and toe ulcers at this point. He has been tolerating the compression wraps without complication overall very pleased in this regard. 08/09/2020 upon evaluation today patient appears to be doing well with regard to his lower extremities bilaterally. Fortunately there is no signs of active infection overall I am pleased with where things stand. 08/23/2020 on evaluation today patient appears to be doing well with  regard to his wound. He has been tolerating the dressing changes without complication. Fortunately there is no signs of active infection at this time. Overall his legs seem to be doing quite well which is great news and I am very pleased in that regard. No fevers, chills, nausea, vomiting, or diarrhea. 09/13/2020 upon evaluation today patient appears to be doing okay in regard to his lower extremities. He does have a fairly large blister on the right leg which I did remove the blister tissue from today so we  can get this to dry out other than that however he seems to be doing quite well. There is no signs of active infection at this time. 09/27/2020 upon evaluation today patient appears to actually be doing some better in regard to his right leg. Fortunately signs of active infection at this time which is great news. No fevers, chills, nausea, vomiting, or diarrhea. 10/04/2020 upon evaluation today patient actually appears to be showing signs of improvement which is great news with regard to his leg ulcers. Fortunately there is no signs of active infection which is great news he is still taking the antibiotics currently. No fevers, chills, nausea, vomiting, or diarrhea. 10/18/2020 on evaluation today patient appears to be doing well with regard to his legs currently. He has been tolerating the dressing changes including the wraps without complication. Fortunately there is no signs of active infection at this time. No fevers, chills, nausea, vomiting, or diarrhea. 10/25/2020 upon evaluation today patient appears to be doing decently well in regard to his wounds currently. He has been tolerating the dressing changes without complication. Overall I feel like he is making good progress albeit slow. Again this is something we can have to continue to wrap for some time to come most likely. 11/08/2020 upon evaluation today patient appears to be doing well with regard to his wounds currently. He has been  tolerating the dressing changes without complication is not currently on any antibiotics and he does not appear to show any signs of infection. He does continue to have a lot of drainage on the right leg not too severe but nonetheless this is very scattered. On the left leg this is looking to be much improved overall. 11/15/2020 upon evaluation today patient appears to be doing better with regard to his legs bilaterally. Especially the right leg which was much more significant last week. There does not appear to be any signs of active infection which is great news. No fevers, chills, nausea, vomiting, or diarrhea. 11/23/2019 upon evaluation today patient appears to be doing poorly still in regard to his lower extremities bilaterally. Unfortunately his right leg in particular appears to be doing much more poorly there is no signs really of infection this is not warm to touch but he does have a lot of drainage and weeping unfortunately. With that reason I do believe that we may need to initiate some treatment here to try to help calm down some of the swelling of the right leg. I think switching to a 4-layer compression wrap would be beneficial here. The patient is in agreement with giving this a try. 11/29/2020 upon evaluation today patient appears to be doing well currently in regard to his leg ulcers. I feel like the right leg is doing better he still has a lot of drainage but we do see some improvement here. The 4-layer compression wrap I think was helpful. 12/06/2020 upon evaluation today patient appears to be doing well with regard to his legs. In fact they seem to be doing about the best I have seen up to this point. Fortunately there is no signs of active infection at this time. No fevers, chills, nausea, vomiting, or diarrhea. 12/20/2020 upon evaluation today patient appears to be doing well at this time in regard to his legs. He is not having any significant draining which is great news. Fortunately  there is no signs of active infection at this time. No fevers, chills, nausea, vomiting, or diarrhea. 01/17/2021 upon evaluation today Melik actually appears to  be doing excellent in regard to his legs. He has a few areas again that come and go as far as his toes are concerned but overall this is doing quite well. 01/31/2021 upon evaluation today patient appears to be doing well with regard to his legs. Fortunately there does not appear to be any signs of active infection which is great news. Overall he is still having significant edema despite the compression wraps basically the 4-layer compression wrap to just keep things under control there is really not much room for play. 4/13: Mr. Caiazzo is a longstanding patient in our clinic and benefits greatly from weekly compression wraps. Today he has no complaints. He has been tolerating the wraps well. He states he is using the lymphedema pumps at home. 5/4; patient presents for follow-up of his chronic lymphedema/venous insufficiency ulcers. He comes weekly for compression wraps. He has no complaints today. He was unable to tolerate the Coflex 2 layer Last week so we will do the four press 4-layer compression. He has been using his lymphedema pumps daily. 5/18; patient presents for 2-week follow-up. He has no complaints or issues today. He has developed a new wound to the right foot on his fourth toe. He overall feels well and denies signs of infection. 6/1; patient presents for 2-week follow-up. He has no complaints or issues today. He denies signs of infection. 04/18/2021 upon evaluation today patient appears to be doing well with regard to his legs bilaterally. Family open wound is actually on the toe of his left foot everything else is completely closed which is great news. In general I am extremely pleased with where things stand at this point. The patient is also happy that things are doing so well. 05/02/2021 upon evaluation today patient's legs  actually appear to be doing quite well today. Fortunately there does not appear to be any signs of active infection which is great and overall I am extremely pleased with where he stands today. The patient does not appear to have any evidence of active infection at this time which is also great news. 05/09/2021 upon evaluation today patient appears to be doing a little bit more poorly in regard to his legs. Unfortunately he is having issues with some breakdown and a blood blister on the left leg this is due to I believe honestly to how it was wrapped last week. Fortunately there does not appear to be any signs of infection but nonetheless this is still a concern to be honest. No fevers, chills, nausea, vomiting, or diarrhea. 05/16/2021 upon evaluation today patient appears to be doing significantly better as compared to last week. I am very pleased with where things stand today. There does not appear to be any signs of infection which is great news and overall very pleased with where we stand. No fevers, chills, nausea, vomiting, or diarrhea. 05/30/2021 upon evaluation today patient appears to be doing well with regard to his legs. He has been tolerating the dressing changes without complication. Fortunately there does not appear to be any signs of active infection which is great news and overall I am extremely pleased with where things stand today. No fevers, chills, nausea, vomiting, or diarrhea. 06/20/2021 upon evaluation today patient actually appears to be making good progress today and very pleased with what we are seeing. I think his legs are really maintaining. As long as we continue wrapping he seems to be doing excellent in my opinion. Fortunately there is no signs of active infection at this  time. No fevers, chills, nausea, vomiting, or diarrhea. 07/11/2021 upon evaluation today patient actually appears to be making excellent progress at this time. Fortunately there does not appear to be any  evidence of active infection which is great news and overall I am extremely pleased with where things stand today. No fevers, chills, nausea, vomiting, or diarrhea. 07/25/2021 upon evaluation today patient appears to be doing well currently in regard to his lower extremities. He has been making good progress here and I do not see anything that is actually open significantly today this is great news. No fevers, chills, nausea, vomiting, or diarrhea. 08/08/2021 upon evaluation today patient appears to be doing well with regard to his wound. He has been tolerating the dressing changes without complication. With that being said unfortunately has a new area that opened up as far as his right posterior leg is concerned this was a blister he also has an area on the third toe right foot which also reopen. Fortunately there is no signs of active infection at this time which is great news. No fevers, chills, nausea, vomiting, or diarrhea. Electronic Signature(s) Signed: 08/08/2021 11:38:27 AM By: Lenda Kelp PA-C Entered By: Lenda Kelp on 08/08/2021 11:38:27 -------------------------------------------------------------------------------- Physical Exam Details Patient Name: Date of Service: [name], [name] 08/08/2021 10:30 A M Medical Record Number: [id number] Patient Account Number: [id number] Date of Birth/Sex: Treating RN: 31-Jan-1951 (70 y.o. [name] Primary Care Provider: Nicoletta Ba Other Clinician: Referring Provider: Treating Provider/Extender: Adele Dan in Treatment: 289 Constitutional Obese and well-hydrated in no acute distress. Respiratory normal breathing without difficulty. Psychiatric this patient is able to make decisions and demonstrates good insight into disease process. Alert and Oriented x 3. pleasant and cooperative. Notes Upon inspection patient's wound bed showed signs of good granulation epithelization there does not appear to  be anything deep. I think that this will heal quite readily we will use silver alginate as we have in the past and honestly I think he will do excellent in this regard. Electronic Signature(s) Signed: 08/08/2021 11:38:46 AM By: Lenda Kelp PA-C Entered By: Lenda Kelp on 08/08/2021 11:38:45 -------------------------------------------------------------------------------- Physician Orders Details Patient Name: Date of Service: [name], Cheick J. 08/08/2021 10:30 A M Medical Record Number: [id number] Patient Account Number: [id number] Date of Birth/Sex: Treating RN: 1950/12/29 (70 y.o. [name], [name] Primary Care Provider: Nicoletta Ba Other Clinician: Referring Provider: Treating Provider/Extender: Adele Dan in Treatment: [phone] Verbal / Phone Orders: No Diagnosis Coding ICD-10 Coding Code Description L97.521 Non-pressure chronic ulcer of other 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 of skin L97.829 Non-pressure chronic ulcer of other part of left lower leg with unspecified severity L97.519 Non-pressure chronic ulcer of other part of right foot with unspecified severity L97.812 Non-pressure chronic ulcer of other part of right lower leg with fat layer exposed I87.333 Chronic venous hypertension (idiopathic) with ulcer and inflammation of bilateral lower extremity I89.0 Lymphedema, not elsewhere classified E11.622 Type 2 diabetes mellitus with other skin ulcer E11.40 Type 2 diabetes mellitus with diabetic neuropathy, unspecified Follow-up Appointments ppointment in 2 weeks. Leonard Schwartz 08/22/2021 Return A Nurse Visit: - 08/15/2021 Bathing/ Shower/ Hygiene May shower with protection but do not get wound dressing(s) wet. Edema Control - Lymphedema / SCD / Other Bilateral Lower Extremities Lymphedema Pumps. Use Lymphedema pumps on leg(s) 2-3 times a day for 45-60 minutes. If wearing any wraps or hose, do not remove them. Continue exercising as  instructed.  Elevate legs to the level of the heart or above for 30 minutes daily and/or when sitting, a frequency of: - throughout the day Avoid standing for long periods of time. Exercise regularly Non Wound Condition Bilateral Lower Extremities Other Non Wound Condition Orders/Instructions: - lotion to both legs, 4 layer compression wraps both legs, silver alginate to any weeping areas Wound Treatment Wound #[id number] - Lower Leg Wound Laterality: Right, Posterior Cleanser: Soap and Water 1 x Per Week Discharge Instructions: May shower and wash wound with dial antibacterial soap and water prior to dressing change. Cleanser: Wound Cleanser 1 x Per Week Discharge Instructions: Cleanse the wound with wound cleanser prior to applying a clean dressing using gauze sponges, not tissue or cotton balls. Peri-Wound Care: Zinc Oxide Ointment 30g tube 1 x Per Week Discharge Instructions: Apply Zinc Oxide to periwound with each dressing change Peri-Wound Care: Sween Lotion (Moisturizing lotion) 1 x Per Week Discharge Instructions: Apply moisturizing lotion as directed Prim Dressing: KerraCel Ag Gelling Fiber Dressing, 4x5 in (silver alginate) 1 x Per Week ary Discharge Instructions: Apply silver alginate to wound bed as instructed Secondary Dressing: ABD Pad, 8x10 1 x Per Week Discharge Instructions: Apply over primary dressing as directed. Compression Wrap: FourPress (4 layer compression wrap) 1 x Per Week Discharge Instructions: ****UNNA BOOT FIRST LAYER APPLIED TO UPPER PORTION OF LOWER LEG.*** Wound #[id number] - T Third oe Wound Laterality: Right Cleanser: Soap and Water 1 x Per Week Discharge Instructions: May shower and wash wound with dial antibacterial soap and water prior to dressing change. Cleanser: Wound Cleanser 1 x Per Week Discharge Instructions: Cleanse the wound with wound cleanser prior to applying a clean dressing using gauze sponges, not tissue or cotton balls. Prim Dressing: KerraCel Ag  Gelling Fiber Dressing, 2x2 in (silver alginate) 1 x Per Week ary Discharge Instructions: Apply silver alginate to wound bed as instructed Secured With: Conforming Stretch Gauze Bandage, Sterile 2x75 (in/in) 1 x Per Week Discharge Instructions: Secure with stretch gauze as directed. Secured With: 65M Medipore H Soft Cloth Surgical Tape, 2x2 (in/yd) 1 x Per Week Discharge Instructions: Secure dressing with tape as directed. Electronic Signature(s) Signed: 08/09/2021 6:25:33 PM By: Shawn Stall RN, BSN Signed: 08/10/2021 9:30:20 AM By: Lenda Kelp PA-C Entered By: Shawn Stall on 08/08/2021 11:34:53 -------------------------------------------------------------------------------- Problem List Details Patient Name: Date of Service: [name], [name] 08/08/2021 10:30 A M Medical Record Number: [id number] Patient Account Number: [id number] Date of Birth/Sex: Treating RN: [birth year] (69 y.o. [name] Primary Care Provider: Nicoletta Ba Other Clinician: Referring Provider: Treating Provider/Extender: Adele Dan in Treatment: 289 Active Problems ICD-10 Encounter Code Description Active Date MDM Diagnosis L97.521 Non-pressure chronic ulcer of other 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 of 04/27/2018 No Yes skin L97.829 Non-pressure chronic ulcer of other part of left lower leg with unspecified 03/21/2021 No Yes severity L97.519 Non-pressure chronic ulcer of other part of right foot with unspecified severity 03/21/2021 No Yes L97.812 Non-pressure chronic ulcer of other part of right lower leg with fat layer 08/08/2021 No Yes exposed I87.333 Chronic venous hypertension (idiopathic) with ulcer and inflammation of 01/22/2016 No Yes bilateral lower extremity I89.0 Lymphedema, not elsewhere classified 01/22/2016 No Yes E11.622 Type 2 diabetes mellitus with other skin ulcer 01/22/2016 No Yes E11.40 Type 2 diabetes mellitus with diabetic neuropathy,  unspecified 01/22/2016 No Yes Inactive Problems ICD-10 Code Description Active Date Inactive Date L03.115 Cellulitis of right lower limb 12/22/2017 12/22/2017 L97.228 Non-pressure chronic ulcer of left  calf with other specified severity 06/30/2018 06/30/2018 L97.511 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 of skin 06/30/2018 06/30/2018 Resolved Problems ICD-10 Code Description Active Date Resolved Date L97.211 Non-pressure chronic ulcer of right calf limited to breakdown of skin 06/30/2018 06/30/2018 L97.221 Non-pressure chronic ulcer of left calf limited to breakdown of skin 09/30/2016 09/30/2016 L03.116 Cellulitis of left lower limb 04/01/2017 04/01/2017 L97.211 Non-pressure chronic ulcer of right calf limited to breakdown of skin 06/30/2017 06/30/2017 Electronic Signature(s) Signed: 08/08/2021 11:28:43 AM By: Lenda Kelp PA-C Previous Signature: 08/08/2021 10:43:30 AM Version By: Lenda Kelp PA-C Entered By: Lenda Kelp on 08/08/2021 11:28:42 -------------------------------------------------------------------------------- Progress Note Details Patient Name: Date of Service: [name], [name] 08/08/2021 10:30 A M Medical Record Number: [id number] Patient Account Number: [id number] Date of Birth/Sex: Treating RN: 05/13/1951 (70 y.o. [name] Primary Care Provider: Nicoletta Ba Other Clinician: Referring Provider: Treating Provider/Extender: Adele Dan in Treatment: 289 Subjective Chief Complaint Information obtained from Patient Patient is here for evaluation of venous/lymphedema ulcers History of Present Illness (HPI) Referred by PCP for consultation. Patient has long standing history of BLE venous stasis, no prior ulcerations. At beginning of month, developed cellulitis and weeping. Received IM Rocephin followed by Keflex and resolved. Wears compression stocking, appr 6 months old. Not sure strength. No present  drainage. 01/22/16 this is a patient who is a type II diabetic on insulin. 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 He tells me he religiously wears pressure stockings of uncertain strength. He was here with weeping edema about 8 months ago but did not have an open wound. Roughly a month ago he had a reopening on his bilateral legs. He is been using bandages and Neosporin. He does not complain of pain. 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 Last BUN/creatinine I have is from 11/20/15 at 13 and 1.0 respectively 01/29/16; patient arrives today having tolerated the Profore wrap. He brought in his stockings and these are 18 mmHg stockings he bought from [place]. The compression here is likely inadequate. He does not complain of pain or excessive drainage she has no systemic symptoms. The wound on the right looks improved as does the one on the left although one on the left is more substantial with still tissue at risk below the actual wound area on the bilateral posterior calf 02/05/16; patient arrives with poor edema control. He states that we did put a 4 layer compression on it last week. No weight appear 5 this. 02/12/16; the area on the posterior right Has healed. The left Has a substantial wound that has necrotic surface eschar that requires a debridement with a curette. 02/16/16;the patient called or a Nurse visit secondary to increased swelling. He had been in earlier in the week with his right leg healed. He was transitioned to is on pressure stocking on the right leg with the only open wound on the left, a substantial area on the left posterior calf. Note he has a history of severe lower extremity edema, he has a history of chronic atrial fibrillation but not heart failure per my notes but I'll need to research this. He is not complaining of chest pain  shortness of breath or orthopnea. The intake nurse noted blisters on the previously closed right leg 02/19/16; this is the patient's regular visit day.  I see him on Friday with escalating edema new wounds on the right leg and clear signs of at least right ventricular heart failure. I increased his Lasix to 40 twice a day. He is returning currently in follow-up. States he is noticed a decrease in that the edema 02/26/16 patient's legs have much less edema. There is nothing really open on the right leg. The left leg has improved condition of the large superficial wound on the posterior left leg 03/04/16; edema control is very much better. The patient's right leg wounds have healed. On the left leg he continues to have severe venous inflammation on the posterior aspect of the left leg. There is no tenderness and I don't think any of this is cellulitis. 03/11/16; patient's right leg is married healed and he is in his own stocking. The patient's left leg has deteriorated somewhat. There is a lot of erythema around the wound on the posterior left leg. There is also a significant rim of erythema posteriorly just above where the wrap would've ended there is a new wound in this location and a lot of tenderness. Can't rule out cellulitis in this area. 03/15/16; patient's right leg remains healed and he is in his own stocking. The patient's left leg is much better than last review. His major wound on the posterior aspect of his left Is almost fully epithelialized. He has 3 small injuries from the wraps. Really. Erythema seems a lot better on antibiotics 03/18/16; right leg remains healed and he is in his own stocking. The patient's left leg is much better. The area on the posterior aspect of the left calf is fully epithelialized. His 3 small injuries which were wrap injuries on the left are improved only one seems still open his erythema has resolved 03/25/16; patient's right leg remains healed and he is in his own  stocking. There is no open area today on the left leg posterior leg is completely closed up. His wrap injuries at the superior aspect of his leg are also resolved. He looks as though he has some irritation on the dorsal ankle but this is fully epithelialized without evidence of infection. 03/28/16; we discharged this patient on Monday. Transitioned him into his own stocking. There were problems almost immediately with uncontrolled swelling weeping edema multiple some of which have opened. He does not feel systemically unwell in particular no chest pain no shortness of breath and he does not feel 04/08/16; the edema is under better control with the Profore light wrap but he still has pitting edema. There is one large wound anteriorly 2 on the medial aspect of his left leg and 3 small areas on the superior posterior calf. Drainage is not excessive he is tolerating a Profore light well 04/15/16; put a Profore wrap on him last week. This is controlled is edema however he had a lot of pain on his left anterior foot most of his wounds are healed 04/22/16 once again the patient has denuded areas on the left anterior foot which he states are because his wrap slips up word. He saw his primary physician today is on Lasix 40 twice a day and states that he his weight is down 20 pounds over the last 3 months. 04/29/16: Much improved. left anterior foot much improved. He is now on Lasix 80 mg per day. Much improved edema control 05/06/16; I was hoping to be able to discharge him today however once again he has blisters at a low level of where the compression  was placed last week mostly on his left lateral but also his left medial leg and a small area on the anterior part of the left foot. 05/09/16; apparently the patient went home after his appointment on 7/4 later in the evening developing pain in his upper medial thigh together with subjective fever and chills although his temperature was not taken. The pain was so intense  he felt he would probably have to call 911. However he then remembered that he had leftover doxycycline from a previous round of antibiotics and took these. By the next morning he felt a lot better. He called and spoke to one of our nurses and I approved doxycycline over the phone thinking that this was in relation to the wounds we had previously seen although they were definitely were not. The patient feels a lot better old fever no chills he is still working. Blood sugars are reasonably controlled 05/13/16; patient is back in for review of his cellulitis on his anterior medial upper thigh. He is taking doxycycline this is a lot better. Culture I did of the nodular area on the dorsal aspect of his foot grew MRSA this also looks a lot better. 05/20/16; the patient is cellulitis on the medial upper thigh has resolved. All of his wound areas including the left anterior foot, areas on the medial aspect of the left calf and the lateral aspect of the calf at all resolved. He has a new blister on the left dorsal foot at the level of the fourth toe this was excised. No evidence of infection 05/27/16; patient continues to complain weeping edema. He has new blisterlike wounds on the left anterior lateral and posterior lateral calf at the top of his wrap levels. The area on his left anterior foot appears better. He is not complaining of fever, pain or pruritus in his feet. 05/30/16; the patient's blisters on his left anterior leg posterior calf all look improved. He did not increase the Lasix 100 mg as I suggested because he was going to run out of his 40 mg tablets. He is still having weeping edema of his toes 06/03/16; I renewed his Lasix at 80 mg once a day as he was about to run out when I last saw him. He is on 80 mg of Lasix now. I have asked him to cut down on the excessive amount of water he was drinking and asked him to drink according to his thirst mechanisms 06/12/2016 -- was seen 2 days ago and was  supposed to wear his compression stockings at home but he is developed lymphedema and superficial blisters on the left lower extremity and hence came in for a review 06/24/16; the remaining wound is on his left anterior leg. He still has edema coming from between his toes. There is lymphedema here however his edema is generally better than when I last saw this. He has a history of atrial fibrillation but does not have a known history of congestive heart failure nevertheless I think he probably has this at least on a diastolic basis. 07/01/16 I reviewed his echocardiogram from January 2017. This was essentially normal. He did not have LVH, EF of 55-60%. His right ventricular function was normal although he did have trivial tricuspid and pulmonic regurgitation. This is not audible on exam however. I increased his Lasix to do massive edema in his legs well above his knees I think in early July. He was also drinking an excessive amount of water at the time. 07/15/16;  missed his appointment last week because of the Labor Day holiday on Monday. He could not get another appointment later in the week. Started to feel the wrap digging in superiorly so we remove the top half and the bottom half of his wrap. He has extensive erythema and blistering superiorly in the left leg. Very tender. Very swollen. Edema in his foot with leaking edema fluid. He has not been systemically unwell 07/22/16; the area on the left leg laterally required some debridement. The medial wounds look more stable. His wrap injury wounds appear to have healed. Edema and his foot is better, weeping edema is also better. He tells me he is meeting with the supplier of the external compression pumps at work 08/05/16; the patient was on vacation last week in [facility]. His wrap is been on for an extended period of time. Also over the weekend he developed an extensive area of tender erythema across his anterior medial thigh. He took to doxycycline  yesterday that he had leftover from a previous prescription. The patient complains of weeping edema coming out of his toes 08/08/16; I saw this patient on 10/2. He was tender across his anterior thigh. I put him on doxycycline. He returns today in follow-up. He does not have any open wounds on his lower leg, he still has edema weeping into his toes. 08/12/16; patient was seen back urgently today to follow-up for his extensive left thigh cellulitis/erysipelas. He comes back with a lot less swelling and erythema pain is much better. I believe I gave him Augmentin and Cipro. His wrap was cut down as he stated a roll down his legs. He developed blistering above the level of the wrap that remained. He has 2 open blisters and 1 intact. 08/19/16; patient is been doing his primary doctor who is increased his Lasix from 40-80 once a day or 80 already has less edema. Cellulitis has remained improved in the left thigh. 2 open areas on the posterior left calf 08/26/16; he returns today having new open blisters on the anterior part of his left leg. He has his compression pumps but is not yet been shown how to use some vital representative from the supplier. 09/02/16 patient returns today with no open wounds on the left leg. Some maceration in his plantar toes 09/10/2016 -- Dr. Leanord Hawking had recently discharged him on 09/02/2016 and he has come right back with redness swelling and some open ulcers on his left lower extremity. He says this was caused by trying to apply his compression stockings and he's been unable to use this and has not been able to use his lymphedema pumps. He had some doxycycline leftover and he has started on this a few days ago. 09/16/16; there are no open wounds on his leg on the left and no evidence of cellulitis. 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 He does not complain of symptoms here. I am not clear use using his external  compression pumps. 09/23/16; I have not seen this patient in 2 weeks. He canceled his appointment 10 days ago as he was going on vacation. He tells me that on Monday he noticed a large area on his posterior left leg which is been draining copiously and is reopened into a large wound. He is been using ABDs and the external part of his juxtalite, according to our nurse this was not on properly. 10/07/16; Still a substantial area on the posterior left leg.  Using silver alginate 10/14/16; in general better although there is still open area which looks healthy. Still using silver alginate. He reminds me that this happen before he left for [facility]. T oday while he was showering in the morning. He had been using his juxtalite's 10/21/16; the area on his posterior left leg is fully epithelialized. However he arrives today with a large area of tender erythema in his medial and posterior left thigh just above the knee. I have marked the area. Once again he is reluctant to consider hospitalization. I treated him with oral antibiotics in the past for a similar situation with resolution I think with doxycycline however this area it seems more extensive to me. He is not complaining of fever but does have chills and says states he is thirsty. His blood sugar today was in the 140s at home 10/25/16 the area on his posterior left leg is fully epithelialized although there is still some weeping edema. The large area of tenderness and erythema in his medial and posterior left thigh is a lot less tender although there is still a lot of swelling in this thigh. He states he feels a lot better. He is on doxycycline and Augmentin that I started last week. This will continued until Tuesday, December 26. I have ordered a duplex ultrasound of the left thigh rule out DVT whether there is an abscess something that would need to be drained I would also like to know. 11/01/16; he still has weeping edema from a not fully  epithelialized area on his left posterior calf. Most of the rest of this looks a lot better. He has completed his antibiotics. His thigh is a lot better. Duplex ultrasound did not show a DVT in the thigh 11/08/16; he comes in today with more Denuded surface epithelium from the posterior aspect of his calf. There is no real evidence of cellulitis. The superior aspect of his wrap appears to have put quite an indentation in his leg just below the knee and this may have contributed. He does not complain of pain or fever. We have been using silver alginate as the primary dressing. The area of cellulitis in the right thigh has totally resolved. He has been using his compression stockings once a week 11/15/16; the patient arrives today with more loss of epithelium from the posterior aspect of his left calf. He now has a fairly substantial wound in this area. The reason behind this deterioration isn't exactly clear although his edema is not well controlled. He states he feels he is generally more swollen systemically. He is not complaining of chest pain shortness of breath fever. T me he has an appointment with his primary physician in early February. He is on 80 mg of oral ells Lasix a day. He claims compliance with the external compression pumps. He is not having any pain in his legs similar to what he has with his recurrent cellulitis 11/22/16; the patient arrives a follow-up of his large area on his left lateral calf. This looks somewhat better today. He came in earlier in the week for a dressing change since I saw him a week ago. He is not complaining of any pain no shortness of breath no chest pain 11/28/16; the patient arrives for follow-up of his large area on the left lateral calf this does not look better. In fact it is larger weeping edema. The surface of the wound does not look too bad. We have been using silver alginate although I'm not  certain that this is a dressing issue. 12/05/16; again the patient  follows up for a large wound on the left lateral and left posterior calf this does not look better. There continues to be weeping edema necrotic surface tissue. More worrisome than this once again there is erythema below the wound involving the distal Achilles and heel suggestive of cellulitis. He is on his feet working most of the day of this is not going well. We are changing his dressing twice a week to facilitate the drainage. 12/12/16; not much change in the overall dimensions of the large area on the left posterior calf. This is very inflamed looking. I gave him an. Doxycycline last week does not really seem to have helped. He found the wrap very painful indeed it seems to of dog into his legs superiorly and perhaps around the heel. He came in early today because the drainage had soaked through his dressings. 12/19/16- patient arrives for follow-up evaluation of his left lower extremity ulcers. He states that he is using his lymphedema pumps once daily when there is "no drainage". He admits to not using his lipedema pumps while under current treatment. His blood sugars have been consistently between 150-200. 12/26/16; the patient is not using his compression pumps at home because of the wetness on his feet. I've advised him that I think it's important for him to use this daily. He finds his feet too wet, he can put a plastic bag over his legs while he is in the pumps. Otherwise I think will be in a vicious circle. We are using silver alginate to the major area on his left posterior calf 01/02/17; the patient's posterior left leg has further of all into 3 open wounds. All of them covered with a necrotic surface. He claims to be using his compression pumps once a day. His edema control is marginal. Continue with silver alginate 01/10/17; the patient's left posterior leg actually looks somewhat better. There is less edema, less erythema. Still has 3 open areas covered with a necrotic surface requiring  debridement. He claims to be using his compression pumps once a day his edema control is better 01/17/17; the patient's left posterior calf look better last week when I saw him and his wrap was changed 2 days ago. He has noted increasing pain in the left heel and arrives today with much larger wounds extensive erythema extending down into the entire heel area especially tender medially. He is not systemically unwell CBGs have been controlled no fever. Our intake nurse showed me limegreen drainage on his AVD pads. 01/24/17; his usual this patient responds nicely to antibiotics last week giving him Levaquin for presumed Pseudomonas. The whole entire posterior part of his leg is much better much less inflamed and in the case of his Achilles heel area much less tender. He has also had some epithelialization posteriorly there are still open areas here and still draining but overall considerably better 01/31/17- He has continue to tolerate the compression wraps. he states that he continues to use the lymphedema pumps daily, and can increase to twice daily on the weekends. He is voicing no complaints or concerns regarding his LLE ulcers 02/07/17-he is here for follow-up evaluation. He states that he noted some erythema to the left medial and anterior thigh, which he states is new as of yesterday. He is concerned about recurrent cellulitis. He states his blood sugars have been slightly elevated, this morning in the 180s 02/14/17; he is here for follow-up evaluation.  When he was last here there was erythema superiorly from his posterior wound in his anterior thigh. He was prescribed Levaquin however a culture of the wound surface grew MRSA over the phone I changed him to doxycycline on Monday and things seem to be a lot better. 02/24/17; patient missed his appointment on Friday therefore we changed his nurse visit into a physician visit today. Still using silver alginate on the large area of the posterior left thigh.  He isn't new area on the dorsal left second toe 03/03/17; actually better today although he admits he has not used his external compression pumps in the last 2 days or so because of work responsibilities over the weekend. 03/10/17; continued improvement. External compression pumps once a day almost all of his wounds have closed on the posterior left calf. Better edema control 03/17/17; in general improved. He still has 3 small open areas on the lateral aspect of his left leg however most of the area on the posterior part of his leg is epithelialized. He has better edema control. He has an ABD pad under his stocking on the right anterior lower leg although he did not let us look at that today. 03/24/17; patient arrives back in clinic today with no open areas however there are areas on the posterior left calf and anterior left calf that are less than 100% epithelialized. His edema is well controlled in the left lower leg. There is some pitting edema probably lymphedema in the left upper thigh. He uses compression pumps at home once per day. I tried to get him to do this twice a day although he is very reticent. 04/01/2017 -- for the last 2 days he's had significant redness, tenderness and weeping and came in for an urgent visit today. 04/07/17; patient still has 6 more days of doxycycline. He was seen by Dr. Meyer Russel last Wednesday for cellulitis involving the posterior aspect, lateral aspect of his Involving his heel. For the most part he is better there is less erythema and less weeping. He has been on his feet for 12 hours o2 over the weekend. Using his compression pumps once a day 04/14/17 arrives today with continued improvement. Only one area on the posterior left calf that is not fully epithelialized. He has intense bilateral venous inflammation associated with his chronic venous insufficiency disease and secondary lymphedema. We have been using silver alginate to the left posterior calf wound In passing  he tells [place] today that the right leg but we have not seen in quite some time has an open area on it but he doesn't want [place] to look at this today states he will show this to [place] next week. 04/21/17; there is no open area on his left leg although he still reports some weeping edema. He showed [place] his right leg today which is the first time we've seen this leg in a long time. He has a large area of open wound on the right leg anteriorly healthy granulation. Quite a bit of swelling in the right leg and some degree of venous inflammation. He told us about the right leg in passing last week but states that deterioration in the right leg really only happened over the weekend 04/28/17; there is no open area on the left leg although there is an irritated part on the posterior which is like a wrap injury. The wound on the right leg which was new from last week at least to [place] is a lot better. 05/05/17; still no  open area on the left leg. Patient is using his new compression stocking which seems to be doing a good job of controlling the edema. He states he is using his compression pumps once per day. The right leg still has an open wound although it is better in terms of surface area. Required debridement. A lot of pain in the posterior right Achilles marked tenderness. Usually this type of presentation this patient gives concern for an active cellulitis 05/12/17; patient arrives today with his major wound from last week on the right lateral leg somewhat better. Still requiring debridement. He was using his compression stocking on the left leg however that is reopened with superficial wounds anteriorly he did not have an open wound on this leg previously. He is still using his juxta light's once daily at night. He cannot find the time to do this in the morning as he has to be at work by 7 AM 05/19/17; right lateral leg wound looks improved. No debridement required. The concerning area is on the left posterior leg which  appears to almost have a subcutaneous hemorrhagic component to it. We've been using silver alginate to all the wounds 05/26/17; the right lateral leg wound continues to look improved. However the area on the left posterior calf is a tightly adherent surface. [place] using silver alginate. Because of the weeping edema in his legs there is very little good alternatives. 06/02/17; the patient left here last week looking quite good. Major wound on the left posterior calf and a small one on the right lateral calf. Both of these look satisfactory. He tells me that by Wednesday he had noted increased pain in the left leg and drainage. He called on Thursday and Friday to get an appointment here but we were blocked. He did not go to urgent care or his primary physician. He thinks he had a fever on Thursday but did not actually take his temperature. He has not been using his compression pumps on the left leg because of pain. I advised him to go to the emergency room today for IV antibiotics for stents of left leg cellulitis but he has refused I have asked him to take 2 days off work to keep his leg elevated and he has refused this as well. In view of this I'm going to call him and Augmentin and doxycycline. 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 However after a detailed discussion regarding the risk benefits and alternatives he refuses to get admitted to the hospital. With no other recourse I will continue him on oral antibiotics as before and hopefully he'll have his infectious disease consultation this week. 06/16/2017 -- the patient was seen today by the nurse practitioner at infectious disease Ms. Dixon. Her review noted recurrent cellulitis of the lower  extremity with tinea pedis of the left foot and she has recommended clindamycin 150 mg daily for now and she may increase it to 300 mg daily to cover staph and Streptococcus. He has also been advise Lotrimin cream locally. she also had wise IV antibiotics for his condition if it flares up 06/23/17; patient arrives today with drainage bilaterally although the remaining wound on the left posterior calf after cleaning up today "highlighter yellow drainage" did not look  too bad. Unfortunately he has had breakdown on the right anterior leg [previously this leg had not been open and he is using a black stocking] he went to see infectious disease and is been put on clindamycin 150 mg daily, I did not verify the dose although I'm not familiar with using clindamycin in this dosing range, perhaps for prophylaxisoo 06/27/17; I brought this patient back today to follow-up on the wound deterioration on the right lower leg together with surrounding cellulitis. I started him on doxycycline 4 days ago. This area looks better however he comes in today with intense cellulitis on the medial part of his left thigh. This is not have a wound in this area. Extremely tender. We've been using silver alginate to the wounds on the right lower leg left lower leg with bilateral 4 layer compression he is using his external compression pumps once a day 07/04/17; patient's left medial thigh cellulitis looks better. He has not been using his compression pumps as his insert said it was contraindicated with cellulitis. His right leg continues to make improvements all the wounds are still open. We only have one remaining wound on the left posterior calf. Using silver alginate to all open areas. He is on doxycycline which I started a week ago and should be finishing I gave him Augmentin after Thursday's visit for the severe cellulitis on the left medial thigh which fortunately looks better 07/14/17; the patient's left medial thigh cellulitis  has resolved. The cellulitis in his right lower calf on the right also looks better. All of his wounds are stable to improved we've been using silver alginate he has completed the antibiotics I have given him. He has clindamycin 150 mg once a day prescribed by infectious disease for prophylaxis, I've advised him to start this now. We have been using bilateral Unna boots over silver alginate to the wound areas 07/21/17; the patient is been to see infectious disease who noted his recurrent problems with cellulitis. He was not able to tolerate prophylactic clindamycin therefore he is on amoxicillin 500 twice a day. He also had a second daily dose of Lasix added By Dr. Oneta Rack but he is not taking this. Nor is he being completely compliant with his compression pumps a especially not this week. He has 2 remaining wounds one on the right posterior lateral lower leg and one on the left posterior medial lower leg. 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 The patient has Unna boots bilaterally. 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 He has bilateral Unna boots for compression and silver alginate to his wounds. Arrives today with his legs looking as good as I have seen him in quite some time. Not surprisingly his wounds look better as well with improvement on the right lateral leg venous insufficiency wound and also the left medial leg. He is still using the compression pumps once a day 08/11/17; both legs appear to be doing better wounds on the right lateral and left medial legs look better. Skin on the right leg quite good. He is been using silver alginate as the primary dressing. I'm going to use Anasept gel calcium alginate and maintain all the secondary dressings 08/18/17; the  patient continues to actually do quite well. The area on his right lateral leg  is just about closed the left medial also looks better although it is still moist in this area. His edema is well controlled we have been using Anasept gel with calcium alginate and the usual secondary dressings, 4 layer compression and once daily use of his compression pumps "always been able to manage 09/01/17; the patient continues to do reasonably well in spite of his trip to [place]. The area on the right lateral leg is epithelialized. Left is much better but still open. He has more edema and more chronic erythema on the left leg [venous inflammation] 09/08/17; he arrives today with no open wound on the right lateral leg and decently controlled edema. Unfortunately his left leg is not nearly as in his good situation as last week.he apparently had increasing edema starting on Saturday. He edema soaked through into his foot so used a plastic bag to walk around his home. The area on the medial right leg which was his open area is about the same however he has lost surface epithelium on the left lateral which is new and he has significant pain in the Achilles area of the left foot. He is already on amoxicillin chronically for prophylaxis of cellulitis in the left leg 09/15/17; he is completed a week of doxycycline and the cellulitis in the left posterior leg and Achilles area is as usual improved. He still has a lot of edema and fluid soaking through his dressings. There is no open wound on the right leg. He saw infectious disease NP today 09/22/17;As usual 1 we transition him from our compression wraps to his stockings things did not go well. He has several small open areas on the right leg. He states this was caused by the compression wrap on his skin although he did not wear this with the stockings over them. He has several superficial areas on the left leg medially laterally posteriorly. He does not have any evidence  of active cellulitis especially involving the left Achilles The patient is traveling from [facility] Saturday going to [facility]. He states he isn't attempting to get an appointment with a heel objects wound center there to change his dressings. I am not completely certain whether this will work 10/06/17; the patient came in on Friday for a nurse visit and the nurse reported that his legs actually look quite good. He arrives in clinic today for his regular follow-up visit. He has a new wound on his left third toe over the PIP probably caused by friction with his footwear. He has small areas on the left leg and a very superficial but epithelialized area on the right anterior lateral lower leg. Other than that his legs look as good as I've seen him in quite some time. We have been using silver alginate Review of systems; no chest pain no shortness of breath other than this a 10 point review of systems negative 10/20/17; seen by Dr. Meyer Russel last week. He had taken some antibiotics [doxycycline] that he had left over. Dr. Meyer Russel thought he had candida infection and declined to give him further antibiotics. He has a small wound remaining on the right lateral leg several areas on the left leg including a larger area on the left posterior several left medial and anterior and a small wound on the left lateral. The area on the left dorsal third toe looks a lot better. ROS; Gen.; no fever, respiratory no cough no sputum Cardiac no chest pain other than this 10 point review of system is  negative 10/30/17; patient arrives today having fallen in the bathtub 3 days ago. It took him a while to get up. He has pain and maceration in the wounds on his left leg which have deteriorated. He has not been using his pumps he also has some maceration on the right lateral leg. 11/03/17; patient continues to have weeping edema especially in the left leg. This saturates his dressings which were just put on on 12/27. As usual  the doxycycline seems to take care of the cellulitis on his lower leg. He is not complaining of fever, chills, or other systemic symptoms. He states his leg feels a lot better on the doxycycline I gave him empirically. He also apparently gets injections at his primary doctor's officeo Rocephin for cellulitis prophylaxis. I didn't ask him about his compression pump compliance today I think that's probably marginal. Arrives in the clinic with all of his dressings primary and secondary macerated full of fluid and he has bilateral edema 11/10/17; the patient's right leg looks some better although there is still a cluster of wounds on the right lateral. The left leg is inflamed with almost circumferential skin loss medially to laterally although we are still maintaining anteriorly. He does not have overt cellulitis there is a lot of drainage. He is not using compression pumps. We have been using silver alginate to the wound areas, there are not a lot of options here 11/17/17; the patient's right leg continues to be stable although there is still open wounds, better than last week. The inflammation in the left leg is better. Still loss of surface layer epithelium especially posteriorly. There is no overt cellulitis in the amount of edema and his left leg is really quite good, tells me he is using his compression pumps once a day. 11/24/17; patient's right leg has a small superficial wound laterally this continues to improve. The inflammation in the left leg is still improving however we have continuous surface layer epithelial loss posteriorly. There is no overt cellulitis in the amount of edema in both legs is really quite good. He states he is using his compression pumps on the left leg once a day for 5 out of 7 days 12/01/17; very small superficial areas on the right lateral leg continue to improve. Edema control in both legs is better today. He has continued loss of surface epithelialization and left  posterior calf although I think this is better. We have been using silver alginate with large number of absorptive secondary dressings 4 layer on the left Unna boot on the right at his request. He tells me he is using his compression pumps once a day 12/08/17; he has no open area on the right leg is edema control is good here. ooOn the left leg however he has marked erythema and tenderness breakdown of skin. He has what appears to be a wrap injury just distal to the popliteal fossa. 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 There was swelling erythema and breakdown in the right leg. He has much worse skin breakdown in the left leg as well multiple open areas medially and posteriorly as well as laterally. He tells me he has been using his compression pumps but tells me he feels  that the drainage out of his leg is worse when he uses a compression pumps. T be fair to him he is been saying this o for a while however I don't know that I have really been listening to this. I wonder if the compression pumps are working properly 12/22/17;. Once again he arrives with severe erythema, weeping edema from the left greater than right leg. Noncompliance with compression pumps. New this visit he is complaining of pain on the lateral aspect of the right leg and the medial aspect of his right thigh. He apparently saw his cardiologist Dr. [name] who was ordered an echocardiogram area and I think this is a step in the right direction 12/25/17; started his doxycycline Monday night. There is still intense erythema of the right leg especially in the anterior thigh although there is less tenderness. The erythema around the wound on the right lateral  calf also is less tender. He still complaining of pain in the left heel. His wounds are about the same right lateral left medial left lateral. Superficial but certainly not close to closure. He denies being systemically unwell no fever chills no abdominal pain no diarrhea 12/29/17; back in follow-up of his extensive right calf and right thigh cellulitis. I added amoxicillin to cover possible doxycycline resistant strep. This seems to of done the trick he is in much less pain there is much less erythema and swelling. He has his echocardiogram at 11:00 this morning. X-ray of the left heel was also negative. 01/05/18; the patient arrived with his edema under much better control. Now that he is retired he is able to use his compression pumps daily and sometimes twice a day per the patient. He has a wound on the right leg the lateral wound looks better. Area on the left leg also looks a lot better. He has no evidence of cellulitis in his bilateral thighs I had a quick peak at his echocardiogram. He is in normal ejection fraction and normal left ventricular function. He has moderate pulmonary hypertension moderately reduced right ventricular function. One would have to wonder about chronic sleep apnea although he says he doesn't snore. He'll review the echocardiogram with his cardiologist. 01/12/18; the patient arrives with the edema in both legs under exemplary control. He is using his compression pumps daily and sometimes twice daily. His wound on the right lateral leg is just about closed. He still has some weeping areas on the posterior left calf and lateral left calf although everything is just about closed here as well. I have spoken with Aldean Baker who is the patient's nurse practitioner and infectious disease. She was concerned that the patient had not understood that the parenteral penicillin injections he was receiving for cellulitis prophylaxis was actually benefiting him. I don't think the  patient actually saw that I would tend to agree we were certainly dealing with less infections although he had a serious one last month. 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 He is healed. He'll be placed in juxtalite compression wraps and increase his lymphedema pumps to twice daily. We will follow up again next week to ensure there are no issues with the new regiment. 01/20/18-he is here for evaluation of bilateral lower extremity weeping edema. Yesterday he was placed in compression wrap to the right lower extremity and compression stocking to left lower shrubbery. He states he uses lymphedema pumps last night and again this morning and noted a blister to the left lower extremity.  On exam he was noted to have drainage to the right lower extremity. He will be placed in Unna boots bilaterally and follow-up next week 01/26/18; patient was actually discharged a week ago to his own juxta light stockings only to return the next day with bilateral lower extremity weeping edema.he was placed in bilateral Unna boots. He arrives today with pain in the back of his left leg. There is no open area on the right leg however there is a linear/wrap injury on the left leg and weeping edema on the left leg posteriorly. I spoke with infectious disease about 10 days ago. They were disappointed that the patient elected to discontinue prophylactic intramuscular penicillin shots as they felt it was particularly beneficial in reducing the frequency of his cellulitis. I discussed this with the patient today. He does not share this view. He'll definitely need antibiotics today. Finally he is traveling to [place] and trauma leaving this Saturday and returning a week later and he does not travel with his pumps. He is going by car 01/30/18; patient was seen 4 days ago and brought back in today for review of cellulitis in the left leg posteriorly. I put him on amoxicillin this really  hasn't helped as much as I might like. He is also worried because he is traveling to [facility] trauma by car. Finally we will be rewrapping him. There is no open area on the right leg over his left leg has multiple weeping areas as usual 02/09/18; The same wrap on for 10 days. He did not pick up the last doxycycline I prescribed for him. He apparently took 4 days worth he already had. There is nothing open on his right leg and the edema control is really quite good. He's had damage in the left leg medially and laterally especially probably related to the prolonged use of Unna boots 02/12/18; the patient arrived in clinic today for a nurse visit/wrap change. He complained of a lot of pain in the left posterior calf. He is taking doxycycline that I previously prescribed for him. Unfortunately even though he used his stockings and apparently used to compression pumps twice a day he has weeping edema coming out of the lateral part of his right leg. This is coming from the lower anterior lateral skin area. 02/16/18; the patient has finished his doxycycline and will finish the amoxicillin 2 days. The area of cellulitis in the left calf posteriorly has resolved. He is no longer having any pain. He tells me he is using his compression pumps at least once a day sometimes twice. 02/23/18; the patient finished his doxycycline and Amoxil last week. On Friday he noticed a small erythematous circle about the size of a quarter on the left lower leg just above his ankle. This rapidly expanded and he now has erythema on the lateral and posterior part of the thigh. This is bright red. Also has an area on the dorsal foot just above his toes and a tender area just below the left popliteal fossa. He came off his prophylactic penicillin injections at his own insistence one or 2 months ago. This is obviously deteriorated since then 03/02/18; patient is on doxycycline and Amoxil. Culture I did last week of the weeping area on the  back of his left calf grew group B strep. I have therefore renewed the amoxicillin 500 3 times a day for a further week. He has not been systemically unwell. Still complaining of an area of discomfort right under his left popliteal  fossa. There is no open wound on the right leg. He tells me that he is using his pumps twice a day on most days 03/09/18; patient arrives in clinic today completing his amoxicillin today. The cellulitis on his left leg is better. Furthermore he tells me that he had intramuscular penicillin shots that his primary care office today. However he also states that the wrap on his right leg fell down shortly after leaving clinic last week. He developed a large blister that was present when he came in for a nurse visit later in the week and then he developed intense discomfort around this area.He tells me he is using his compression pumps 03/16/18; the patient has completed his doxycycline. The infectious part of this/cellulitis in the left heel area left popliteal area is a lot better. He has 2 open areas on the right calf. Still areas on the left calf but this is a lot better as well. 03/24/18; the patient arrives complaining of pain in the left popliteal area again. He thinks some of this is wrap injury. He has no open area on the right leg and really no open area on the left calf either except for the popliteal area. He claims to be compliant with the compression pumps 03/31/18; I gave him doxycycline last week because of cellulitis in the left popliteal area. This is a lot better although the surface epithelium is denuded off and response to this. He arrives today with uncontrolled edema in the right calf area as well as a fingernail injury in the right lateral calf. There is only a few open areas on the left 04/06/18; I gave him amoxicillin doxycycline over the last 2 weeks that the amoxicillin should be completing currently. He is not complaining of any pain or systemic symptoms.  The only open areas see has is on the right lateral lower leg paradoxically I cannot see anything on the left lower leg. He tells me he is using his compression pumps twice a day on most days. Silver alginate to the wounds that are open under 4 layer compression 04/13/18; he completed antibiotics and has no new complaints. Using his compression pumps. Silver alginate that anything that's opened 04/20/18; he is using his compression pumps religiously. Silver alginate 4 layer compression anything that's opened. He comes in today with no open wounds on the left leg but 3 on the right including a new one posteriorly. He has 2 on the right lateral and one on the right posterior. He likes Unna boots on the right leg for reasons that aren't really clear we had the usual 4 layer compression on the left. It may be necessary to move to the 4 layer compression on the right however for now I left them in the Unna boots 04/27/18; he is using his compression pumps at least once a day. He has still the wounds on the right lateral calf. The area right posteriorly has closed. He does not have an open wound on the left under 4 layer compression however on the dorsal left foot just proximal to the toes and the left third toe 2 small open areas were identified 05/11/18; he has not uses compression pumps. The areas on the right lateral calf have coalesced into one large wound necrotic surface. On the left side he has one small wound anteriorly however the edema is now weeping out of a large part of his left leg. He says he wasn't using his pumps because of the weeping fluid. I  explained to him that this is the time he needs to pump more 05/18/18; patient states he is using his compression pumps twice a day. The area on the right lateral large wound albeit superficial. On the left side he has innumerable number of small new wounds on the left calf particularly laterally but several anteriorly and medially. All these appear  to have healthy granulated base these look like the remnants of blisters however they occurred under compression. The patient arrives in clinic today with his legs somewhat better. There is certainly less edema, less multiple open areas on the left calf and the right anterior leg looks somewhat better as well superficial and a little smaller. However he relates pain and erythema over the last 3-4 days in the thigh and I looked at this today. He has not been systemically unwell no fever no chills no change in blood sugar values 05/25/18; comes in today in a better state. The severe cellulitis on his left leg seems better with the Keflex. Not as tender. He has not been systemically unwell ooHard to find an open wound on the left lower leg using his compression pumps twice a day ooThe confluent wounds on his right lateral calf somewhat better looking. These will ultimately need debridement I didn't do this today. 06/01/18; the severe cellulitis on the left anterior thigh has resolved and he is completed his Keflex. ooThere is no open wound on the left leg however there is a superficial excoriation at the base of the third toe dorsally. Skin on the bottom of his left foot is macerated looking. ooThe left the wounds on the lateral right leg actually looks some better although he did require debridement of the top half of this wound area with an open curet 06/09/18 on evaluation today patient appears to be doing poorly in regard to his right lower extremity in particular this appears to likely be infected he has very thick purulent discharge along with a bright green tent to the discharge. This makes me concerned about the possibility of pseudomonas. He's also having increased discomfort at this point on evaluation. Fortunately there does not appear to be any evidence of infection spreading to the other location at this time. 06/16/18 on evaluation today patient appears to actually be doing fairly well. His  ulcer has actually diminished in size quite significantly at this point which is good news. Nonetheless he still does have some evidence of infection he did see infectious disease this morning before coming here for his appointment. I did review the results of their evaluation and their note today. They did actually have him discontinue the Cipro and initiate treatment with linezolid at this time. He is doing this for the next seven days and they recommended a follow-up in four months with them. He is the keep a log of the need for intermittent antibiotic therapy between now and when he falls back up with infectious disease. This will help them gaze what exactly they need to do to try and help them out. 06/23/18; the patient arrives today with no open wounds on the left leg and left third toe healed. He is been using his compression pumps twice a day. On the right lateral leg he still has a sizable wound but this is a lot better than last time I saw this. 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 Has been using silver  alginate under 4 layer compression 06/30/18; the only open wound he has is on the right lateral leg and this looks healthy. No debridement is required. We have been using silver alginate. He does not have an open wound on the left leg. There is apparently some drainage from the dorsal proximal third toe on the left although I see no open wound here. 07/03/18 on evaluation today patient was actually here just for a nurse visit rapid change. However when he was here on Wednesday for his rat change due to having been healed on the left and then developing blisters we initiated the wrap again knowing that he would be back today for [place] to reevaluate and see were at. Unfortunately he has developed some cellulitis into the proximal portion of his right lower extremity even into the region of his  thigh. 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 He was placed on one as what at that point. Nonetheless he is done with that and has been tolerating it well otherwise. Doxycycline which in the past really did not seem to be effective for him. Nonetheless I think the best option may be for [place] to definitely reinitiate the antibiotics for a longer period of time. 07/07/18; since I last saw this patient a week ago he has had a difficult time. At that point he did not have an open wound on his left leg. We transitioned him into juxta light stockings. He was apparently in the clinic the next day with blisters on the left lateral and left medial lower calf. He also had weeping edema fluid. He was put back into a compression wrap. He was also in the clinic on Friday with intense erythema in his right thigh. Per the patient he was started on Bactrim however that didn't work at all in terms of relieving his pain and swelling. He has taken 3 doxycycline that he had left over from last time and that seems to of helped. He has blistering on the right thigh as well. 07/14/18; the erythema on his right thigh has gotten better with doxycycline that he is finishing. 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 Presumably this cellulitis in the thigh was not related to that although I think this is a bit concerning going forward. He still has an area on the right lateral calf the blister on the right medial calf just below the knee that was discussed above. On the left 2 small open areas left medial and left lateral. Edema control is adequate. He is using his compression pumps twice a day 07/20/18; continued improvement in the condition of both legs especially the edema in his bilateral thighs. He tells me he is been losing weight through a combination of diet and exercise. He is using his compression pumps  twice a day. So overall she made to the remaining wounds 07/27/2018; continued improvement in condition of both legs. His edema is well controlled. The area on the right lateral leg is just about closed he had one blisters show up on the medial left upper calf. We have him in 4 layer compression. He is going on a 10-day trip to [place]. He will be driving. He wants to wear Unna boots because of the lessening amount of constriction. He will not use compression pumps while he is away 08/05/18 on evaluation  today patient actually appears to be doing decently well all things considered in regard to his bilateral lower extremities. The worst ulcer is actually only posterior aspect of his left lower extremity with a four layer compression wrap cut into his leg a couple weeks back. He did have a trip and actually had [employer] for the trip that he is worn since he was last here. Nonetheless he feels like the [employer] actually do better for him his swelling is up a little bit but he also with his trip was not taking his Lasix on a regular set schedule like he was supposed to be. He states that obviously the reason being that he cannot drive and keep going without having to urinate too frequently which makes it difficult. He did not have his pumps with him while he was away either which I think also maybe playing a role here too. 08/13/2018; the patient only has a small open wound on the right lateral calf which is a big improvement in the last month or 2. He also has the area posteriorly just below the posterior fossa on the left which I think was a wrap injury from several weeks ago. He has no current evidence of cellulitis. He tells me he is back into his compression pumps twice a day. He also tells me that while he was at the laundromat somebody stole a section of his extremitease stockings 08/20/2018; back in the clinic with a much improved state. He only has small  areas on the right lateral mid calf which is just about healed. This was is more substantial area for quite a prolonged period of time. He has a small open area on the left anterior tibia. The area on the posterior calf just below the popliteal fossa is closed today. He is using his compression pumps twice a day 08/28/2018; patient has no open wound on the right leg. He has a smattering of open areas on the calf with some weeping lymphedema. More problematically than that it looks as though his wraps of slipped down in his usual he has very angry upper area of edema just below the right medial knee and on the right lateral calf. He has no open area on his feet. The patient is traveling to [facility] next week. I will send him in an antibiotic. We will continue to wrap the right leg. We ordered extremitease stockings for him last week and I plan to transition the right leg to a stocking when he gets home which will be in 10 days time. As usual he is very reluctant to take his pumps with him when he travels 09/07/2018; patient returns from [facility]. He shows me a picture of his left leg in the mid part of his trip last week with intense fire engine erythema. The picture look bad enough I would have considered sending him to the hospital. Instead he went to the wound care center in [facility]. They did not prescribe him antibiotics but he did take some doxycycline he had leftover from a previous visit. I had given him trimethoprim sulfamethoxazole before he left this did not work according to the patient. This is resulted in some improvement fortunately. He comes back with a large wound on the left posterior calf. Smaller area on the left anterior tibia. Denuded blisters on the dorsal left foot over his toes. Does not have much in the way of wounds on the right leg although he  does have a very tender area on the right posterior area just below the popliteal fossa also suggestive of  infection. He promises me he is back on his pumps twice a day 09/15/2018; the intense cellulitis in his left lower calf is a lot better. The wound area on the posterior left calf is also so better. However he has reasonably extensive wounds on the dorsal aspect of his second and third toes and the proximal foot just at the base of the toes. There is nothing open on the right leg 09/22/2018; the patient has excellent edema control in his legs bilaterally. He is using his external compression pumps twice a day. He has no open area on the right leg and only the areas in the left foot dorsally second and third toe area on the left side. He does not have any signs of active cellulitis. 10/06/2018; the patient has good edema control bilaterally. He has no open wound on the right leg. There is a blister in the posterior aspect of his left calf that we had to deal with today. He is using his compression pumps twice a day. There is no signs of active cellulitis. We have been using silver alginate to the wound areas. He still has vulnerable areas on the base of his left first second toes dorsally He has a his extremities stockings and we are going to transition him today into the stocking on the right leg. He is cautioned that he will need to continue to use the compression pumps twice a day. If he notices uncontrolled edema in the right leg he may need to go to 3 times a day. 10/13/2018; the patient came in for a nurse check on Friday he has a large flaccid blister on the right medial calf just below the knee. We unroofed this. He has this and a new area underneath the posterior mid calf which was undoubtedly a blister as well. He also has several small areas on the right which is the area we put his extremities stocking on. 10/19/2018; the patient went to see infectious disease this morning I am not sure if that was a routine follow-up in any case the doxycycline I had given him was discontinued and started on  linezolid. He has not started this. It is easy to look at his left calf and the inflammation and think this is cellulitis however he is very tender in the tissue just below the popliteal fossa and I have no doubt that there is infection going on here. He states the problem he is having is that with the compression pumps the edema goes down and then starts walking the wrap falls down. We will see if we can adhere this. He has 1 or 2 minuscule open areas on the right still areas that are weeping on the posterior left calf, the base of his left second and third toes 10/26/18; back today in clinic with quite of skin breakdown in his left anterior leg. This may have been infection the area below the popliteal fossa seems a lot better however tremendous epithelial loss on the left anterior mid tibia area over quite inexpensive tissue. He has 2 blisters on the right side but no other open wound here. 10/29/2018; came in urgently to see [place] today and we worked him in for review. He states that the 4 layer compression on the right leg caused pain he had to cut it down to roughly his mid calf this caused swelling above the  wrap and he has blisters and skin breakdown today. As a result of the pain he has not been using his pumps. Both legs are a lot more edematous and there is a lot of weeping fluid. 11/02/18; arrives in clinic with continued difficulties in the right leg> left. Leg is swollen and painful. multiple skin blisters and new open areas especially laterally. He has not been using his pumps on the right leg. 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 He is not systemically unwell. 11/09/2018; the patient claims he is being compliant with his pumps. He is finished the doxycycline I gave him last week. Culture I did of the wound on the right lateral leg showed a few very resistant methicillin staph aureus. This was resistant to doxycycline. Nevertheless he states  the pain in the leg is a lot better which makes me wonder if the cultured organism was not really what was causing the problem nevertheless this is a very dangerous organism to be culturing out of any wound. His right leg is still a lot larger than the left. He is using an Radio broadcast assistant on this area, he blames a 4-layer compression for causing the original skin breakdown which I doubt is true however I cannot talk him out of it. We have been using silver alginate to all of these areas which were initially blisters 11/16/2018; patient is being compliant with his external compression pumps at twice a day. Miraculously he arrives in clinic today with absolutely no open wounds. He has better edema control on the left where he has been using 4 layer compression versus wound of wounds on the right and I pointed this out to him. There is no inflammation in the skin in his lower legs which is also somewhat unusual for him. There is no open wounds on the dorsal left foot. He has extremitease stockings at home and I have asked him to bring these in next week. 11/25/18 patient's lower extremity on examination today on the left appears for the most part to be wound free. He does have an open wound on the lateral aspect of the right lower extremity but this is minimal compared to what I've seen in past. He does request that we go ahead and wrap the left leg as well even though there's nothing open just so hopefully it will not reopen in short order. 1/28; patient has superficial open wounds on the right lateral calf left anterior calf and left posterior calf. His edema control is adequate. 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 We have been using silver alginate as the primary dressing. He claims compliance with his compression pumps 2/4; patient has superficial open wounds on numerous areas of his left calf and again one on the  left dorsal foot. The areas on the right lateral calf have healed. The cellulitis that I gave him doxycycline for last week is also resolved this was mostly on the left anterior calf just below the tibial tuberosity. His edema looks fairly well-controlled. He tells me he went to see his primary doctor today and had blood work ordered 2/11; once again he has several open areas on the left calf left tibial area. Most of these are small and appear to have healthy granulation. He does not have anything open on the right. The edema and control in his thighs is pretty good which  is usually a good indication he has been using his pumps as requested. 2/18; he continues to have several small areas on the left calf and left tibial area. Most of these are small healthy granulation. We put him in his stocking on the right leg last week and he arrives with a superficial open area over the right upper tibia and a fairly large area on the right lateral tibia in similar condition. His edema control actually does not look too bad, he claims to be using his compression pumps twice a day 2/25. Continued small areas on the left calf and left tibial area. New areas especially on the right are identified just below the tibial tuberosity and on the right upper tibia itself. There are also areas of weeping edema fluid even without an obvious wound. He does not have a considerable degree of lymphedema but clearly there is more edema here than his skin can handle. He states he is using the pumps twice a day. We have an Unna boot on the right and 4 layer compression on the left. 3/3; he continues to have an area on the right lateral calf and right posterior calf just below the popliteal fossa. There is a fair amount of tenderness around the wound on the popliteal fossa but I did not see any evidence of cellulitis, could just be that the wrap came down and rubbed in this area. ooHe does not have an open area on the left leg  however there is an area on the left dorsal foot at the base of the third toe ooWe have been using silver alginate to all wound areas 3/10; he did not have an open area on his left leg last time he was here a week ago. T oday he arrives with a horizontal wound just below the tibial tuberosity and an area on the left lateral calf. He has intense erythema and tenderness in this area. The area is on the right lateral calf and right posterior calf better than last week. We have been using silver alginate as usual 3/18 - Patient returns with 3 small open areas on left calf, and 1 small open area on right calf, the skin looks ok with no significant erythema, he continues the UNA boot on right and 4 layer compression on left. The right lateral calf wound is closed , the right posterior is small area. we will continue silver alginate to the areas. 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 He is been tolerating the dressing changes without complication. Fortunately she has made excellent improvement in regard to the overall status of his wounds. Unfortunately every time we cease wrapping him he ends up reopening in causing more significant issues at that point. Again I'm unsure of the best direction to take although I think the lymphedema clinic may be appropriate for him. 02/03/19 on evaluation today patient appears to be doing well in regard to the wounds that we saw him for last week unfortunately he has a new area on the proximal portion of his right medial/posterior lower extremity where the wrap somewhat slowed down and caused swelling and a blister to rub and open. Unfortunately this is the only opening that he has on either leg at this point. 02/17/19 on evaluation today patient's bilateral lower extremities  appear to  be doing well. He still completely healed in regard to the left lower extremity. In regard to the right lower extremity the area where the wrap and slid down and caused the blister still seems to be slightly open although this is dramatically better than during the last evaluation two weeks ago. I'm very pleased with the way this stands overall. 03/03/19 on evaluation today patient appears to be doing well in regard to his right lower extremity in general although he did have a new blister open this does not appear to be showing any evidence of active infection at this time. Fortunately there's No fevers, chills, nausea, or vomiting noted at this time. Overall I feel like he is making good progress it does feel like that the right leg will we perform the [employer] seems to do with a bit better than three layer wrap on the left which slid down on him. We may switch to doing bilateral in the book wraps. 5/4; I have not seen Mr. Gibbon in quite some time. According to our case manager he did not have an open wound on his left leg last week. He had 1 remaining wound on the right posterior medial calf. He arrives today with multiple openings on the left leg probably were blisters and/or wrap injuries from Unna boots. I do not think the Unna boot's will provide adequate compression on the left. I am also not clear about the frequency he is using the compression pumps. 03/17/19 on evaluation today patient appears to be doing excellent in regard to his lower extremities compared to last week's evaluation apparently. He had gotten significantly worse last week which is unfortunate. The [employer] wrap on the left did not seem to do very well for him at all and in fact it didn't control his swelling significantly enough he had an additional outbreak. Subsequently we go back to the four layer compression wrap on the left. This is good news. At least in that he is doing better and the wound seem to be  killing him. He still has not heard anything from the lymphedema clinic. 03/24/19 on evaluation today patient actually appears to be doing much better in regard to his bilateral lower Trinity as compared to last week when I saw him. Fortunately there's no signs of active infection at this time. He has been tolerating the dressing changes without complication. Overall I'm extremely pleased with the progress and appearance in general. 04/07/19 on evaluation today patient appears to be doing well in regard to his bilateral lower extremities. His swelling is significantly down from where it was previous. With that being said he does have a couple blisters still open at this point but fortunately nothing that seems to be too severe and again the majority of the larger openings has healed at this time. 04/14/19 on evaluation today patient actually appears to be doing quite well in regard to his bilateral lower extremities in fact I'm not even sure there's anything significantly open at this time at any site. Nonetheless he did have some trouble with these wraps where they are somewhat irritating him secondary to the fact that he has noted that the graph wasn't too close down to the end of this foot in a little bit short as well up to his knee. Otherwise things seem to be doing quite well. 04/21/19 upon evaluation today patient's wound bed actually showed evidence of being completely healed in regard to both lower extremities which is excellent news. There  does not appear to be any signs of active infection which is also good news. I'm very pleased in this regard. No fevers, chills, nausea, or vomiting noted at this time. 04/28/19 on evaluation today patient appears to be doing a little bit worse in regard to both lower extremities on the left mainly due to the fact that when he went infection disease the wrap was not wrapped quite high enough he developed a blister above this. On the right he is a small open area  of nothing too significant but again this is continuing to give him some trouble he has been were in the Velcro compression that he has at home. 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 He's been tolerating the dressing changes without complication. Fortunately there's no signs of active infection at this time. No fevers, chills, nausea, or vomiting noted at this time. We have been trying to get an appointment with her lymphedema clinic in [facility] but unfortunately nobody can get them on phone with not been able to even fax information over the patient likewise is not been able to get in touch with them. Overall I'm not sure exactly what's going on here with to reach out again today. 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 Still having a lot of drainage unfortunately. He tells me especially in the left but even on the right. There's no signs of active infection which is good news we've been using so ratcheted up to this point. 05/19/19 on evaluation today patient actually appears to be doing quite well with regard to his left lower extremity which is great news. Fortunately in regard to the right lower extremity has an issues with his wrap and he subsequently did remove this from what I'm understanding. Nonetheless long story short is what he had rewrapped once he removed it subsequently had maggots underneath this wrap whenever he came in for evaluation today. With that being said they were obviously completely cleaned away by the nursing staff. The visit today which is excellent news. However he does appear to potentially have some infection around the right ankle region where the maggots were located as well. He will likely require anabiotic therapy today. 05/26/19 on evaluation today patient actually appears to be doing much better  in regard to his bilateral lower extremities. I feel like the infection is under much better control. With that being said there were maggots noted when the wrap was removed yet again today. Again this could have potentially been left over from previous although at this time there does not appear to be any signs of significant drainage there was obviously on the wrap some drainage as well this contracted gnats or otherwise. Either way I do not see anything that appears to be doing worse in my pinion and in fact I think his drainage has slowed down quite significantly likely mainly due to the fact to his infection being under better control. 06/02/2019 on evaluation today patient actually appears to be doing well with regard to his bilateral lower extremities there is no signs of active infection at this time which is great news. With that being said he does have several open areas more so on the right than the left but nonetheless these are all significantly better than previously noted. 06/09/2019 on evaluation today patient actually appears to be doing well.  His wrap stayed up and he did not cause any problems he had more drainage on the right compared to the left but overall I do not see any major issues at this time which is great news. 06/16/2019 on evaluation today patient appears to be doing excellent with regard to his lower extremities the only area that is open is a new blister that can have opened as of today on the medial ankle on the left. Other than this he really seems to be doing great I see no major issues at this point. 06/23/2019 on evaluation today patient appears to be doing quite well with regard to his bilateral lower extremities. In fact he actually appears to be almost completely healed there is a small area of weeping noted of the right lower extremity just above the ankle. Nonetheless fortunately there is no signs of active infection at this time which is good news. No fevers,  chills, nausea, vomiting, or diarrhea. 8/24; the patient arrived for a nurse visit today but complained of very significant pain in the left leg and therefore I was asked to look at this. Noted that he did not have an open area on the left leg last week nevertheless this was wrapped. The patient states that he is not been able to put his compression pumps on the left leg because of the discomfort. He has not been systemically unwell 06/30/2019 on evaluation today patient unfortunately despite being excellent last week is doing much worse with regard to his left lower extremity today. In fact he had to come in for a nurse on Monday where his left leg had to be rewrapped due to excessive weeping Dr. Leanord Hawking placed him on doxycycline at that point. Fortunately there is no signs of active infection Systemically at this time which is good news. 07/07/2019 in regard to the patient's wounds today he actually seems to be doing well with his right lower extremity there really is nothing open or draining at this point this is great news. Unfortunately the left lower extremity is given him additional trouble at this time. There does not appear to be any signs of active infection nonetheless he does have a lot of edema and swelling noted at this point as well as blistering all of which has led to a much more poor appearing leg at this time compared to where it was 2 weeks ago when it was almost completely healed. Obviously this is a little discouraging for the patient. He is try to contact the lymphedema clinic in [place] he has not been able to get through to them. 07/14/2019 on evaluation today patient actually appears to be doing slightly better with regard to his left lower extremity ulcers. Overall I do feel like at least at the top of the wrap that we have been placing this area has healed quite nicely and looks much better. The remainder of the leg is showing signs of improvement. Unfortunately in the thigh  area he still has an open region on the left and again on the right he has been utilizing just a Band-Aid on an area that also opened on the thigh. Again this is an area that were not able to wrap although we did do an Ace wrap to provide some compression that something that obviously is a little less effective than the compression wraps we have been using on the lower portion of the leg. He does have an appointment with the lymphedema clinic in [facility] on Friday. 07/21/2019  on evaluation today patient appears to be doing better with regard to his lower extremity ulcers. He has been tolerating the dressing changes without complication. Fortunately there is no signs of active infection at this time. No fevers, chills, nausea, vomiting, or diarrhea. I did receive the paperwork from the physical therapist at the lymphedema clinic in [place]. Subsequently I signed off on that this morning and sent that back to him for further progression with the treatment plan. 07/28/2019 on evaluation today patient appears to be doing very well with regard to his right lower extremity where I do not see any open wounds at this point. Fortunately he is feeling great as far as that is concerned as well. In regard to the left lower extremity he has been having issues with still several areas of weeping and edema although the upper leg is doing better his lower leg still I think is going require the compression wrap at this time. No fevers, chills, nausea, vomiting, or diarrhea. 08/04/2019 on evaluation today patient unfortunately is having new wounds on the right lower extremity. Again we have been using Unna boot wrap on that side. We switched him to using his juxta lite wrap at home. With that being said he tells me he has been using it although his legs extremely swollen and to be honest really does not appear that he has been. I cannot know that for sure however. Nonetheless he has multiple new wounds on the  right lower extremity at this time. Obviously we will have to see about getting this rewrapped for him today. 08/11/2019 on evaluation today patient appears to be doing fairly well with regard to his wounds. He has been tolerating the dressing changes including the compression wraps without complication. He still has a lot of edema in his upper thigh regions bilaterally he is supposed to be seeing the lymphedema clinic on the 15th of this month once his wraps arrive for the upper part of his legs. 08/18/2019 on evaluation today patient appears to be doing well with regard to his bilateral lower extremities at this point. He has been tolerating the dressing changes without complication. Fortunately there is no signs of active infection which is also good news. He does have a couple weeping areas on the first and second toe of the right foot he also has just a small area on the left foot upper leg and a small area on the left lower leg but overall he is doing quite well in my opinion. He is supposed to be getting his wraps shortly in fact tomorrow and then subsequently is seeing the lymphedema clinic next Wednesday on the 21st. Of note he is also leaving on the 25th to go on vacation for a week to the beach. For that reason and since there is some uncertainty about what there can be doing at lymphedema clinic next Wednesday I am get a make an appointment for next Friday here for [place] to see what we need to do for him prior to him leaving for vacation. 10/23; patient arrives in considerable pain predominantly in the upper posterior calf just distal to the popliteal fossa also in the wound anteriorly above the major wound. This is probably cellulitis and he has had this recurrently in the past. He has no open wound on the right side and he has had an Radio broadcast assistant in that area. Finally I note that he has an area on the left posterior calf which by enlarge is mostly epithelialized.  This protrudes beyond the  borders of the surrounding skin in the setting of dry scaly skin and lymphedema. The patient is leaving for [facility] on Sunday. Per his longstanding pattern, he will not take his compression pumps with him predominantly out of fear that they will be stolen. He therefore asked that we put a Unna boot back on the right leg. He will also contact the wound care center in [facility] to see if they can change his dressing in the mid week. 11/3; patient returned from his vacation to [facility]. He was seen on 1 occasion at their wound care center. They did a 2 layer compression system as they did not have our 4-layer wrap. I am not completely certain what they put on the wounds. They did not change the Unna boot on the right. The patient is also seeing a lymphedema specialist physical therapist in [place]. It appears that he has some compression sleeve for his thighs which indeed look quite a bit better than I am used to seeing. He pumps over these with his external compression pumps. 11/10; the patient has a new wound on the right medial thigh otherwise there is no open areas on the right. He has an area on the left leg posteriorly anteriorly and medially and an area over the left second toe. We have been using silver alginate. He thinks the injury on his thigh is secondary to friction from the compression sleeve he has. 11/17; the patient has a new wound on the right medial thigh last week. He thinks this is because he did not have a underlying stocking for his thigh juxta lite apparatus. He now has this. The area is fairly large and somewhat angry but I do not think he has underlying cellulitis. ooHe has a intact blister on the right anterior tibial area. ooSmall wound on the right great toe dorsally ooSmall area on the medial left calf. 11/30; the patient does not have any open areas on his right leg and we did not take his juxta lite stocking off. However he states that on Friday  his compression wrap fell down lodging around his upper mid calf area. As usual this creates a lot of problems for him. He called urgently today to be seen for a nurse visit however the nurse visit turned into a provider visit because of extreme erythema and pain in the left anterior tibia extending laterally and posteriorly. The area that is problematic is extensive 10/06/2019 upon evaluation today patient actually appears to be doing poorly in regard to his left lower extremity. He Dr. Leanord Hawking did place him on doxycycline this past Monday apparently due to the fact that he was doing much worse in regard to this left leg. Fortunately the doxycycline does seem to be helping. Unfortunately we are still having a very difficult time getting his edema under any type of control in order to anticipate discharge at some point. The only way were really able to control his lymphedema really is with compression wraps and that has only even seemingly temporary. He has been seeing a lymphedema clinic they are trying to help in this regard but still this has been somewhat frustrating in general for the patient. 10/13/19 on evaluation today patient appears to be doing excellent with regard to his right lower extremity as far as the wounds are concerned. His swelling is still quite extensive unfortunately. He is still having a lot of drainage from the thigh areas bilaterally which is unfortunate.  He's been going to lymphedema clinic but again he still really does not have this edema under control as far as his lower extremities are concern. With regard to his left lower extremity this seems to be improving and I do believe the doxycycline has been of benefit for him. He is about to complete the doxycycline. 10/20/2019 on evaluation today patient appears to be doing poorly in regard to his bilateral lower extremities. More in the right thigh he has a lot of irritation at this site unfortunately. In regard to the left  lower extremity the wrap was not quite as high it appears and does seem to have caused him some trouble as well. Fortunately there is no evidence of systemic infection though he does have some blue-green drainage which has me concerned for the possibility of Pseudomonas. He tells me he is previously taking Cipro without complications and he really does not care for Levaquin however due to some of the side effects he has. He is not allergic to any medications specifically antibiotics that were aware of. 10/27/2019 on evaluation today patient actually does appear to be for the most part doing better when compared to last week's evaluation. With that being said he still has multiple open wounds over the bilateral lower extremities. He actually forgot to start taking the Cipro and states that he still has the whole bottle. He does have several new blisters on left lower extremity today I think I would recommend he go ahead and take the Cipro based on what I am seeing at this point. 12/30-Patient comes at 1 week visit, 4 layer compression wraps on the left and Unna boot on the right, primary dressing Xtrasorb and silver alginate. Patient is taking his Cipro and has a few more days left probably 5-6, and the legs are doing better. He states he is using his compressions devices which I believe he has 11/10/2019 on evaluation today patient actually appears to be much better than last time I saw him 2 weeks ago. His wounds are significantly improved and overall I am very pleased in this regard. Fortunately there is no signs of active infection at this time. He is just a couple of days away from completing Cipro. Overall his edema is much better he has been using his lymphedema pumps which I think is also helping at this point. 11/17/2019 on evaluation today patient appears to be doing excellent in regard to his wounds in general. His legs are swollen but not nearly as much as they have been in the past.  Fortunately he is tolerating the compression wraps without complication. No fevers, chills, nausea, vomiting, or diarrhea. He does have some erythema however in the distal portion of his right lower extremity specifically around the forefoot and toes there is a little bit of warmth here as well. 11/24/2019 on evaluation today patient appears to be doing well with regard to his right lower extremity I really do not see any open wounds at this point. His left lower extremity does have several open areas and his right medial thigh also is open. Other than this however overall the patient seems to be making good progress and I am very pleased at this point. 12/01/2019 on evaluation today patient appears to be doing poorly at this point in regard to his left lower extremity has several new blisters despite the fact that we have him in compression wraps. In fact he had a 4-layer compression wrap, his upper thigh wrapped from lymphedema clinic,  and a juxta light over top of the 4 layer compression wrap the lymphedema clinic applied and despite all this he still develop blisters underneath. Obviously this does have me concerned about the fact that unfortunately despite what we are doing to try to get wounds healed he continues to have new areas arise I do not think he is ever good to be at the point where he can realistically just use wraps at home to keep things under control. Typically when we heal him it takes about 1-2 days before he is back in the clinic with severe breakdown and blistering of his lower extremities bilaterally. This is happened numerous times in the past. Unfortunately I think that we may need some help as far as overall fluid overload to kind of limit what we are seeing and get things under better control. 12/08/2019 on evaluation today patient presents for follow-up concerning his ongoing bilateral lower extremity edema. Unfortunately he is still having quite a bit of swelling the  compression wraps are controlling this to some degree but he did see Dr. [name] his cardiologist I do have that available for review today as far as the appointment was concerned that was on 12/06/2019. Obviously that she has been 2 days ago. The patient states that he is only been taking the Lasix 80 mg 1 time a day he had told me previously he was taking this twice a day. Nonetheless Dr. [name] recommended this be up to 80 mg 2 times a day for the patient as he did appear to be fluid overloaded. With that being said the patient states he did this yesterday and he was unable to go anywhere or do anything due to the fact that he was constantly having to urinate. Nonetheless I think that this is still good to be something that is important for him as far as trying to get his edema under control at all things that he is going to be able to just expect his wounds to get under control and things to be better without going through at least a period of time where he is trying to stabilize his fluid management in general and I think increasing the Lasix is likely the first step here. It was also mentioned the possibility that the patient may require metolazone. With that being said he wanted to have the patient take Lasix twice a day first and then reevaluating 2 months to see where things stand. 12/15/2019 upon evaluation today patient appears to be doing regard to his legs although his toes are showing some signs of weeping especially on the left at this point to some degree on the right. There does not appear to be any signs of active infection and overall I do feel like the compression wraps are doing well for him but he has not been able to take the Lasix at home and the increased dose that Dr. [name] recommended. He tells me that just not go to be feasible for him. Nonetheless I think in this case he should probably send a message to Dr. [name] in order to discuss options from the standpoint of possible  admission to get the fluid off or otherwise going forward. 12/22/2019 upon evaluation today patient appears to be doing fairly well with regard to his lower extremities at this point. In fact he would be doing excellent if it was not for the fact that his right anterior thigh apparently had an allergic reaction to adhesive tape that he used. The  wound itself that we have been monitoring actually appears to be healed. There is a lot of irritation at this point. 12/29/2019 upon evaluation today patient appears to be doing well in regard to his lower extremities. His left medial thigh is open and somewhat draining today but this is the only region that is open the right has done much better with the treatment utilizing the steroid cream that I prescribed for him last week. Overall I am pleased in that regard. Fortunately there is no signs of active infection at this time. No fevers, chills, nausea, vomiting, or diarrhea. 01/05/2020 upon evaluation today patient appears to be doing more poorly in regard to his right lower extremity at this point upon evaluation today. Unfortunately he continues to have issues in this regard and I think the biggest issue is controlling his edema. This obviously is not very well controlled at this point is been recommended that he use the Lasix twice a day but he has not been able to do that. Unfortunately I think this is leading to an issue where honestly he is not really able to effectively control his edema and therefore the wounds really are not doing significantly better. I do not think that he is going to be able to keep things under good control unless he is able to control his edema much better. I discussed this again in great detail with him today. 01/12/2020 good news is patient actually appears to be doing quite well today at this point. He does have an appointment with lymphedema clinic tomorrow. His legs appear healed and the toe on the left is almost completely  healed. In general I am very pleased with how things stand at this point. 01/19/2020 upon evaluation today patient appears to actually be doing well in regard to his lower extremities there is nothing open at this point. Fortunately he has done extremely well more recently. Has been seeing lymphedema clinic as well. With that being said he has Velcro wraps for his lower legs as well as his upper legs. The only wound really is on his toe which is the right great toe and this is barely anything even there. With all that being said I think it is good to be appropriate today to go ahead and switch him over to the Velcro compression wraps. 01/26/2020 upon evaluation today patient appears to be doing worse with regard to his lower extremities after last week switch him to Velcro compression wraps. Unfortunately he lasted less than 24 hours he did not have the sock portion of his Velcro wrap on the left leg and subsequently developed a blister underneath the Velcro portion. Obviously this is not good and not what we were looking for at this point. He states the lymphedema clinic did tell him to wear the wrap for 23 hours and take him off for 1 I am okay with that plan but again right now we got a get things back under control again he may have some cellulitis noted as well. 02/02/2020 upon evaluation today patient unfortunately appears to have several areas of blistering on his bilateral lower extremities today mainly on the feet. His legs do seem to be doing somewhat better which is good news. Fortunately there is no evidence of active infection at this time. No fevers, chills, nausea, vomiting, or diarrhea. 02/16/2020 upon evaluation today patient appears to be doing well at this time with regard to his legs. He has a couple weeping areas on his toes but  for the most part everything is doing better and does appear to be sealed up on his legs which is excellent news. We can continue with wrapping him at this  point as he had every time we discontinue the wraps he just breaks out with new wounds. There is really no point in is going forward with this at this point. 03/08/2020 upon evaluation today patient actually appears to be doing quite well with regard to his lower extremity ulcers. He has just a very superficial and really almost nonexistent blister on the left lower extremity he has in general done very well with the compression wraps. With that being said I do not see any signs of infection at this time which is good news. 03/29/2020 upon evaluation today patient appears to be doing well with regard to his wounds currently except for where he had several new areas that opened up due to some of the wrap slipping and causing him trouble. He states he did not realize they had slipped. Nonetheless he has a 1 area on the right and 3 new areas on the left. Fortunately there is no signs of active infection at this time which is great news. 04/05/2020 upon evaluation today patient actually appears to be doing quite well in general in regard to his legs currently. Fortunately there is no signs of active infection at this time. No fevers, chills, nausea, vomiting, or diarrhea. He tells me next week that he will actually be seen in the lymphedema clinic on Thursday at 10 AM I see him on Wednesday next week. 04/12/2020 upon evaluation today patient appears to be doing very well with regard to his lower extremities bilaterally. In fact he does not appear to have any open wounds at this point which is good news. Fortunately there is no signs of active infection at this time. No fevers, chills, nausea, vomiting, or diarrhea. 04/19/2020 upon evaluation today patient appears to be doing well with regard to his wounds currently on the bilateral lower extremities. There does not appear to be any signs of active infection at this time. Fortunately there is no evidence of systemic infection and overall very pleased at this point.  Nonetheless after I held him out last week he literally had blisters the next morning already which swelled up with him being right back here in the clinic. Overall I think that he is just not can be able to be discharged with his legs the way they are he is much to volume overloaded as far as fluid is concerned and that was discussed with him today of also discussed this but should try the clinic nurse manager as well as Dr. Leanord Hawking. 04/26/2020 upon evaluation today patient appears to be doing better with regard to his wounds currently. He is making some progress and overall swelling is under good control with the compression wraps. Fortunately there is no evidence of active infection at this time. 05/10/2020 on evaluation today patient appears to be doing overall well in regard to his lower extremities bilaterally. He is Tolerating the compression wraps without complication and with what we are seeing currently I feel like that he is making excellent progress. There is no signs of active infection at this time. 05/24/2020 upon evaluation today patient appears to be doing well in regard to his legs. The swelling is actually quite a bit down compared to where it has been in the past. Fortunately there is no sign of active infection at this time which is also  good news. With that being said he does have several wounds on his toes that have opened up at this point. 05/31/2020 upon evaluation today patient appears to be doing well with regard to his legs bilaterally where he really has no significant fluid buildup at this point overall he seems to be doing quite well. Very pleased in this regard. With regard to his toes these also seem to be drying up which is excellent. We have continue to wrap him as every time we tried as a transition to the juxta light wraps things just do not seem to get any better. 06/07/2020 upon evaluation today patient appears to be doing well with regard to his right leg at this point.  Unfortunately left leg has a lot of blistering he tells me the wrap started to slide down on him when he tried to put his other Velcro wrap over top of it to help keep things in order but nonetheless still had some issues. 06/14/2020 on evaluation today patient appears to be doing well with regard to his lower extremity ulcers and foot ulcers at this point. I feel like everything is actually showing signs of improvement which is great news overall there is no signs of active infection at this time. No fevers, chills, nausea, vomiting, or diarrhea. 06/21/2020 on evaluation today patient actually appears to be doing okay in regard to his wounds in general. With that being said the biggest issue I see is on his right foot in particular the first and second toe seem to be doing a little worse due to the fact this is staying very wet. I think he is probably getting need to change out his dressings a couple times in between each week when we see him in regard to his toes in order to keep this drier based on the location and how this is proceeding. 06/28/2020 on evaluation today patient appears to be doing a little bit more poorly overall in regard to the appearance of the skin I am actually somewhat concerned about the possibility of him having a little bit of an infection here. We discussed the course of potentially giving him a doxycycline prescription which he is taken previously with good result. With that being said I do believe that this is potentially mild and at this point easily fixed. I just do not want anything to get any worse. 07/12/2020 upon evaluation today patient actually appears to be making some progress with regard to his legs which is great news there does not appear to be any evidence of active infection. Overall very pleased with where things stand. 07/26/2020 upon evaluation today patient appears to be doing well with regard to his leg ulcers and toe ulcers at this point. He has been  tolerating the compression wraps without complication overall very pleased in this regard. 08/09/2020 upon evaluation today patient appears to be doing well with regard to his lower extremities bilaterally. Fortunately there is no signs of active infection overall I am pleased with where things stand. 08/23/2020 on evaluation today patient appears to be doing well with regard to his wound. He has been tolerating the dressing changes without complication. Fortunately there is no signs of active infection at this time. Overall his legs seem to be doing quite well which is great news and I am very pleased in that regard. No fevers, chills, nausea, vomiting, or diarrhea. 09/13/2020 upon evaluation today patient appears to be doing okay in regard to his lower extremities. He does  have a fairly large blister on the right leg which I did remove the blister tissue from today so we can get this to dry out other than that however he seems to be doing quite well. There is no signs of active infection at this time. 09/27/2020 upon evaluation today patient appears to actually be doing some better in regard to his right leg. Fortunately signs of active infection at this time which is great news. No fevers, chills, nausea, vomiting, or diarrhea. 10/04/2020 upon evaluation today patient actually appears to be showing signs of improvement which is great news with regard to his leg ulcers. Fortunately there is no signs of active infection which is great news he is still taking the antibiotics currently. No fevers, chills, nausea, vomiting, or diarrhea. 10/18/2020 on evaluation today patient appears to be doing well with regard to his legs currently. He has been tolerating the dressing changes including the wraps without complication. Fortunately there is no signs of active infection at this time. No fevers, chills, nausea, vomiting, or diarrhea. 10/25/2020 upon evaluation today patient appears to be doing decently well  in regard to his wounds currently. He has been tolerating the dressing changes without complication. Overall I feel like he is making good progress albeit slow. Again this is something we can have to continue to wrap for some time to come most likely. 11/08/2020 upon evaluation today patient appears to be doing well with regard to his wounds currently. He has been tolerating the dressing changes without complication is not currently on any antibiotics and he does not appear to show any signs of infection. He does continue to have a lot of drainage on the right leg not too severe but nonetheless this is very scattered. On the left leg this is looking to be much improved overall. 11/15/2020 upon evaluation today patient appears to be doing better with regard to his legs bilaterally. Especially the right leg which was much more significant last week. There does not appear to be any signs of active infection which is great news. No fevers, chills, nausea, vomiting, or diarrhea. 11/23/2019 upon evaluation today patient appears to be doing poorly still in regard to his lower extremities bilaterally. Unfortunately his right leg in particular appears to be doing much more poorly there is no signs really of infection this is not warm to touch but he does have a lot of drainage and weeping unfortunately. With that reason I do believe that we may need to initiate some treatment here to try to help calm down some of the swelling of the right leg. I think switching to a 4-layer compression wrap would be beneficial here. The patient is in agreement with giving this a try. 11/29/2020 upon evaluation today patient appears to be doing well currently in regard to his leg ulcers. I feel like the right leg is doing better he still has a lot of drainage but we do see some improvement here. The 4-layer compression wrap I think was helpful. 12/06/2020 upon evaluation today patient appears to be doing well with regard to his legs.  In fact they seem to be doing about the best I have seen up to this point. Fortunately there is no signs of active infection at this time. No fevers, chills, nausea, vomiting, or diarrhea. 12/20/2020 upon evaluation today patient appears to be doing well at this time in regard to his legs. He is not having any significant draining which is great news. Fortunately there is no signs of  active infection at this time. No fevers, chills, nausea, vomiting, or diarrhea. 01/17/2021 upon evaluation today Chevis actually appears to be doing excellent in regard to his legs. He has a few areas again that come and go as far as his toes are concerned but overall this is doing quite well. 01/31/2021 upon evaluation today patient appears to be doing well with regard to his legs. Fortunately there does not appear to be any signs of active infection which is great news. Overall he is still having significant edema despite the compression wraps basically the 4-layer compression wrap to just keep things under control there is really not much room for play. 4/13: Mr. Esty is a longstanding patient in our clinic and benefits greatly from weekly compression wraps. Today he has no complaints. He has been tolerating the wraps well. He states he is using the lymphedema pumps at home. 5/4; patient presents for follow-up of his chronic lymphedema/venous insufficiency ulcers. He comes weekly for compression wraps. He has no complaints today. He was unable to tolerate the Coflex 2 layer Last week so we will do the four press 4-layer compression. He has been using his lymphedema pumps daily. 5/18; patient presents for 2-week follow-up. He has no complaints or issues today. He has developed a new wound to the right foot on his fourth toe. He overall feels well and denies signs of infection. 6/1; patient presents for 2-week follow-up. He has no complaints or issues today. He denies signs of infection. 04/18/2021 upon evaluation today  patient appears to be doing well with regard to his legs bilaterally. Family open wound is actually on the toe of his left foot everything else is completely closed which is great news. In general I am extremely pleased with where things stand at this point. The patient is also happy that things are doing so well. 05/02/2021 upon evaluation today patient's legs actually appear to be doing quite well today. Fortunately there does not appear to be any signs of active infection which is great and overall I am extremely pleased with where he stands today. The patient does not appear to have any evidence of active infection at this time which is also great news. 05/09/2021 upon evaluation today patient appears to be doing a little bit more poorly in regard to his legs. Unfortunately he is having issues with some breakdown and a blood blister on the left leg this is due to I believe honestly to how it was wrapped last week. Fortunately there does not appear to be any signs of infection but nonetheless this is still a concern to be honest. No fevers, chills, nausea, vomiting, or diarrhea. 05/16/2021 upon evaluation today patient appears to be doing significantly better as compared to last week. I am very pleased with where things stand today. There does not appear to be any signs of infection which is great news and overall very pleased with where we stand. No fevers, chills, nausea, vomiting, or diarrhea. 05/30/2021 upon evaluation today patient appears to be doing well with regard to his legs. He has been tolerating the dressing changes without complication. Fortunately there does not appear to be any signs of active infection which is great news and overall I am extremely pleased with where things stand today. No fevers, chills, nausea, vomiting, or diarrhea. 06/20/2021 upon evaluation today patient actually appears to be making good progress today and very pleased with what we are seeing. I think his legs  are really maintaining. As long as we  continue wrapping he seems to be doing excellent in my opinion. Fortunately there is no signs of active infection at this time. No fevers, chills, nausea, vomiting, or diarrhea. 07/11/2021 upon evaluation today patient actually appears to be making excellent progress at this time. Fortunately there does not appear to be any evidence of active infection which is great news and overall I am extremely pleased with where things stand today. No fevers, chills, nausea, vomiting, or diarrhea. 07/25/2021 upon evaluation today patient appears to be doing well currently in regard to his lower extremities. He has been making good progress here and I do not see anything that is actually open significantly today this is great news. No fevers, chills, nausea, vomiting, or diarrhea. 08/08/2021 upon evaluation today patient appears to be doing well with regard to his wound. He has been tolerating the dressing changes without complication. With that being said unfortunately has a new area that opened up as far as his right posterior leg is concerned this was a blister he also has an area on the third toe right foot which also reopen. Fortunately there is no signs of active infection at this time which is great news. No fevers, chills, nausea, vomiting, or diarrhea. Objective Constitutional Obese and well-hydrated in no acute distress. Vitals Time Taken: 10:55 AM, Height: 70 in, Weight: 380.2 lbs, BMI: 54.5, Temperature: 97.7 F, Pulse: 76 bpm, Respiratory Rate: 22 breaths/min, Blood Pressure: 116/72 mmHg, Capillary Blood Glucose: 142 mg/dl. General Notes: glucose per pt report this am Respiratory normal breathing without difficulty. Psychiatric this patient is able to make decisions and demonstrates good insight into disease process. Alert and Oriented x 3. pleasant and cooperative. General Notes: Upon inspection patient's wound bed showed signs of good granulation  epithelization there does not appear to be anything deep. I think that this will heal quite readily we will use silver alginate as we have in the past and honestly I think he will do excellent in this regard. Integumentary (Hair, Skin) Wound #198 status is Open. Original cause of wound was Blister. The date acquired was: 08/08/2021. The wound is located on the Right,Posterior Lower Leg. The wound measures 3.5cm length x 5.5cm width x 0.1cm depth; 15.119cm^2 area and 1.512cm^3 volume. The wound is limited to skin breakdown. There is no tunneling or undermining noted. There is a medium amount of serous drainage noted. The wound margin is flat and intact. There is no granulation within the wound bed. There is no necrotic tissue within the wound bed. Wound #199 status is Open. Original cause of wound was Gradually Appeared. The date acquired was: 08/08/2021. The wound is located on the Right T Third. oe The wound measures 0.9cm length x 0.8cm width x 0.1cm depth; 0.565cm^2 area and 0.057cm^3 volume. There is Fat Layer (Subcutaneous Tissue) exposed. There is no tunneling or undermining noted. There is a medium amount of serous drainage noted. The wound margin is flat and intact. There is large (67-100%) red granulation within the wound bed. There is no necrotic tissue within the wound bed. 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 There was a Four Layer Compression Therapy Procedure by Shawn Stall, RN. Post procedure Diagnosis Wound #198: Same as Pre-Procedure There was a Four Layer Compression Therapy Procedure by Shawn Stall, RN. Post procedure Diagnosis Wound #: Same as Pre-Procedure Plan Follow-up Appointments: Return Appointment in 2 weeks. Leonard Schwartz 08/22/2021 Nurse Visit: - 08/15/2021 Bathing/ Shower/ Hygiene: May shower with protection but do not get wound dressing(s) wet. Edema Control - Lymphedema / SCD / Other: Lymphedema Pumps. Use Lymphedema pumps on leg(s) 2-3 times a day for 45-60 minutes. If wearing any wraps or hose, do not remove them. Continue exercising as instructed. Elevate legs to the level of the heart or above for 30 minutes daily and/or when sitting, a frequency of: - throughout the day Avoid standing for long periods of time. Exercise regularly Non Wound Condition: Other Non Wound Condition Orders/Instructions: - lotion to both legs, 4 layer compression wraps both legs, silver alginate to any weeping areas WOUND #198: - Lower Leg Wound Laterality: Right, Posterior Cleanser: Soap and Water 1 x Per Week/ Discharge Instructions: May shower and wash wound with dial antibacterial soap and water prior to dressing change. Cleanser: Wound Cleanser 1 x Per Week/ Discharge Instructions: Cleanse the wound with wound cleanser prior to applying a clean dressing using gauze sponges, not tissue or cotton balls. Peri-Wound Care: Zinc Oxide Ointment 30g tube 1 x Per Week/ Discharge Instructions: Apply Zinc Oxide to periwound with each dressing change Peri-Wound Care: Sween Lotion (Moisturizing lotion) 1 x Per Week/ Discharge Instructions: Apply moisturizing lotion as directed Prim Dressing: KerraCel Ag Gelling Fiber Dressing, 4x5 in (silver alginate) 1 x Per  Week/ ary Discharge Instructions: Apply silver alginate to wound bed as instructed Secondary Dressing: ABD Pad, 8x10 1 x Per Week/ Discharge Instructions: Apply over primary dressing as directed. Com pression Wrap: FourPress (4 layer compression wrap) 1 x Per Week/ Discharge Instructions: ****UNNA BOOT FIRST LAYER APPLIED TO UPPER PORTION OF LOWER LEG.*** WOUND #199: - T Third Wound Laterality: Right oe Cleanser: Soap and Water 1 x Per Week/ Discharge Instructions: May shower and wash wound with dial antibacterial soap and water prior to dressing change. Cleanser: Wound Cleanser 1 x Per Week/ Discharge Instructions: Cleanse the wound with wound cleanser prior to applying a clean dressing using gauze sponges, not tissue or cotton balls. Prim Dressing: KerraCel Ag Gelling Fiber Dressing, 2x2 in (silver alginate) 1 x Per Week/ ary Discharge Instructions: Apply silver alginate to wound bed as instructed Secured With: Conforming Stretch Gauze Bandage, Sterile 2x75 (in/in) 1 x Per Week/ Discharge Instructions: Secure with stretch gauze as directed. Secured With: 89M Medipore H Soft Cloth Surgical T ape, 2x2 (in/yd) 1 x Per Week/ Discharge Instructions: Secure dressing with tape as directed. 1. Would recommend currently that we going continue with wound care measures as before and the patient is in agreement with plan. This includes the use of the silver alginate to the open wound locations and the patient is in agreement with that plan. 2. I am also can recommend that we have the patient continue with the 4-layer compression wrap bilaterally which is doing a great job. 3. I am also can recommend the patient should continue to elevate his legs much as possible to help with edema control. We will see patient back for reevaluation in 1 week here in  the clinic. If anything worsens or changes patient will contact our office for additional recommendations. Electronic Signature(s) Signed: 08/08/2021  11:39:26 AM By: Lenda Kelp PA-C Entered By: Lenda Kelp on 08/08/2021 11:39:26 -------------------------------------------------------------------------------- SuperBill Details Patient Name: Date of Service: [name], Kendre J. 08/08/2021 Medical Record Number: [id number] Patient Account Number: [id number] Date of Birth/Sex: Treating RN: 15-Jan-1951 (70 y.o. 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 Modifier: Quantity: 1 Physician Procedures : CPT4 Code Description Modifier [id number] - WC PHYS LEVEL 4 - EST PT ICD-10 Diagnosis Description L97.521 Non-pressure chronic ulcer of other 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 of skin L97.829 Non-pressure chronic ulcer of other part of left  lower leg with unspecified severity L97.519 Non-pressure chronic ulcer of other part of right foot with unspecified severity L97.812 Non-pressure chronic ulcer of other part of right lower leg with fat layer  exposed Quantity: 1 Electronic Signature(s) Signed: 08/08/2021 11:39:52 AM By: Lenda Kelp PA-C Entered By: Lenda Kelp on 08/08/2021 11:39:49
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
## 2021-08-15 ENCOUNTER — Telehealth: Payer: Self-pay | Admitting: Family Medicine

## 2021-08-15 ENCOUNTER — Other Ambulatory Visit: Payer: Self-pay

## 2021-08-15 ENCOUNTER — Encounter (HOSPITAL_BASED_OUTPATIENT_CLINIC_OR_DEPARTMENT_OTHER): Payer: Medicare Other | Admitting: Internal Medicine

## 2021-08-15 DIAGNOSIS — E11621 Type 2 diabetes mellitus with foot ulcer: Secondary | ICD-10-CM | POA: Diagnosis not present

## 2021-08-15 DIAGNOSIS — L97519 Non-pressure chronic ulcer of other part of right foot with unspecified severity: Secondary | ICD-10-CM | POA: Diagnosis not present

## 2021-08-15 DIAGNOSIS — I87333 Chronic venous hypertension (idiopathic) with ulcer and inflammation of bilateral lower extremity: Secondary | ICD-10-CM | POA: Diagnosis not present

## 2021-08-15 DIAGNOSIS — E1140 Type 2 diabetes mellitus with diabetic neuropathy, unspecified: Secondary | ICD-10-CM | POA: Diagnosis not present

## 2021-08-15 DIAGNOSIS — L03115 Cellulitis of right lower limb: Secondary | ICD-10-CM | POA: Diagnosis not present

## 2021-08-15 DIAGNOSIS — L97521 Non-pressure chronic ulcer of other 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 of skin: Secondary | ICD-10-CM | POA: Diagnosis not present

## 2021-08-15 DIAGNOSIS — I890 Lymphedema, not elsewhere classified: Secondary | ICD-10-CM | POA: Diagnosis not present

## 2021-08-15 DIAGNOSIS — E11622 Type 2 diabetes mellitus with other skin ulcer: Secondary | ICD-10-CM | POA: Diagnosis not present

## 2021-08-15 DIAGNOSIS — L97829 Non-pressure chronic ulcer of other part of left lower leg with unspecified severity: Secondary | ICD-10-CM | POA: Diagnosis not present

## 2021-08-15 DIAGNOSIS — L97812 Non-pressure chronic ulcer of other part of right lower leg with fat layer exposed: Secondary | ICD-10-CM | POA: Diagnosis not present

## 2021-08-15 DIAGNOSIS — Z794 Long term (current) use of insulin: Secondary | ICD-10-CM | POA: Diagnosis not present

## 2021-08-15 DIAGNOSIS — L03116 Cellulitis of left lower limb: Secondary | ICD-10-CM | POA: Diagnosis not present

## 2021-08-15 NOTE — Telephone Encounter (Signed)
Pt called... returning Brittanae's call.

## 2021-08-15 NOTE — Telephone Encounter (Signed)
Spoke with pt and he will be out of 2 BP medications(amlodipine and diltiazem) and wanted to make sure he should still be taking them. Advised from 9/30 appt, he should provide report of bp readings via mychart or call. He was unable to provide this information at the time but will send mychart message by tomorrow for provider review and recommendations.

## 2021-08-15 NOTE — Progress Notes (Signed)
[name], [name] ([id number]) Visit Report for 08/15/2021 SuperBill Details Patient Name: Date of Service: [name], [name] 08/15/2021 Medical Record Number: [id number] Patient Account Number: [id number] Date of Birth/Sex: Treating RN: December 14, 1950 (70 y.o. 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 Electronic Signature(s) Signed: 08/15/2021 4:37:06 PM By: Baltazar Najjar MD Signed: 08/15/2021 4:40:08 PM By: Zenaida Deed RN, BSN Entered By: Zenaida Deed on 08/15/2021 11:41:00
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
## 2021-08-16 ENCOUNTER — Encounter: Payer: Self-pay | Admitting: Family Medicine

## 2021-08-16 DIAGNOSIS — I10 Essential (primary) hypertension: Secondary | ICD-10-CM

## 2021-08-16 MED ORDER — VALSARTAN 320 MG PO TABS: 320.0000 mg | ORAL_TABLET | Freq: Every day | ORAL | 0 refills | Status: DC

## 2021-08-16 MED ORDER — DILTIAZEM HCL ER BEADS 120 MG PO CP24: 120.0000 mg | ORAL_CAPSULE | Freq: Every day | ORAL | 0 refills | Status: DC

## 2021-08-16 NOTE — Addendum Note (Signed)
Addended by: Emi Holes D on: 08/16/2021 02:58 PM   Modules accepted: Orders

## 2021-08-16 NOTE — Addendum Note (Signed)
Addended by: Emi Holes D on: 08/16/2021 02:55 PM   Modules accepted: Orders

## 2021-08-16 NOTE — Telephone Encounter (Signed)
(  BPs about the same ->avg syst around 170s, avg diast around 90s, HR 60s-70s. Currently on amlodipine 10 qd, terazosin 10 qd, clonidine 0.3 tid, and lisinopril 40 qd.)  I recommend he stop lisinopril and switch to valsartan 320mg  daily, pls do 30d rx, no RF.  Needs nonfasting lab in 1 wk, bmet, dx uncontrolled hypertension.  Drop off bp/hr readings when he comes in for lab

## 2021-08-16 NOTE — Telephone Encounter (Signed)
Spoke with pt and advised of recommendations, rx sent in. Lab visit scheduled. Pt made aware refill available for amlodipine, terazosin, diltiazem and clonidine.

## 2021-08-20 ENCOUNTER — Other Ambulatory Visit: Payer: Self-pay

## 2021-08-20 ENCOUNTER — Encounter (HOSPITAL_BASED_OUTPATIENT_CLINIC_OR_DEPARTMENT_OTHER): Payer: Medicare Other | Admitting: Internal Medicine

## 2021-08-20 DIAGNOSIS — E11622 Type 2 diabetes mellitus with other skin ulcer: Secondary | ICD-10-CM | POA: Diagnosis not present

## 2021-08-20 DIAGNOSIS — L97219 Non-pressure chronic ulcer of right calf with unspecified severity: Secondary | ICD-10-CM | POA: Diagnosis not present

## 2021-08-20 DIAGNOSIS — I890 Lymphedema, not elsewhere classified: Secondary | ICD-10-CM | POA: Diagnosis not present

## 2021-08-20 DIAGNOSIS — L97521 Non-pressure chronic ulcer of other 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 of skin: Secondary | ICD-10-CM | POA: Diagnosis not present

## 2021-08-20 DIAGNOSIS — E1140 Type 2 diabetes mellitus with diabetic neuropathy, unspecified: Secondary | ICD-10-CM | POA: Diagnosis not present

## 2021-08-20 DIAGNOSIS — L97519 Non-pressure chronic ulcer of other part of right foot with unspecified severity: Secondary | ICD-10-CM | POA: Diagnosis not present

## 2021-08-20 DIAGNOSIS — L03115 Cellulitis of right lower limb: Secondary | ICD-10-CM | POA: Diagnosis not present

## 2021-08-20 DIAGNOSIS — L97818 Non-pressure chronic ulcer of other part of right lower leg with other specified severity: Secondary | ICD-10-CM | POA: Diagnosis not present

## 2021-08-20 DIAGNOSIS — I87333 Chronic venous hypertension (idiopathic) with ulcer and inflammation of bilateral lower extremity: Secondary | ICD-10-CM | POA: Diagnosis not present

## 2021-08-20 DIAGNOSIS — L97829 Non-pressure chronic ulcer of other part of left lower leg with unspecified severity: Secondary | ICD-10-CM | POA: Diagnosis not present

## 2021-08-20 DIAGNOSIS — L97812 Non-pressure chronic ulcer of other part of right lower leg with fat layer exposed: Secondary | ICD-10-CM | POA: Diagnosis not present

## 2021-08-20 DIAGNOSIS — L97828 Non-pressure chronic ulcer of other part of left lower leg with other specified severity: Secondary | ICD-10-CM | POA: Diagnosis not present

## 2021-08-20 DIAGNOSIS — E11621 Type 2 diabetes mellitus with foot ulcer: Secondary | ICD-10-CM | POA: Diagnosis not present

## 2021-08-20 DIAGNOSIS — L03116 Cellulitis of left lower limb: Secondary | ICD-10-CM | POA: Diagnosis not present

## 2021-08-20 DIAGNOSIS — Z794 Long term (current) use of insulin: Secondary | ICD-10-CM | POA: Diagnosis not present

## 2021-08-20 NOTE — Progress Notes (Signed)
[name], [name] ([id number]) Visit Report for 08/20/2021 HPI Details Patient Name: Date of Service: [name], [name] 08/20/2021 3:45 PM Medical Record Number: [id number] Patient Account Number: [id number] Date of Birth/Sex: Treating RN: 04-11-1951 (70 y.o. [name] Primary Care Provider: Nicoletta Ba Other Clinician: Referring Provider: Treating Provider/Extender: Suzzette Righter in Treatment: 291 History of Present Illness HPI Description: Referred by PCP for consultation. Patient has long standing history of BLE venous stasis, no prior ulcerations. At beginning of month, developed cellulitis and weeping. Received IM Rocephin followed by Keflex and resolved. Wears compression stocking, appr 6 months old. Not sure strength. No present drainage. 01/22/16 this is a patient who is a type II diabetic on insulin. 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 He tells me he religiously wears pressure stockings of uncertain strength. He was here with weeping edema about 8 months ago but did not have an open wound. Roughly a month ago he had a reopening on his bilateral legs. He is been using bandages and Neosporin. He does not complain of pain. 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 Last BUN/creatinine I have is from 11/20/15 at 13 and 1.0 respectively 01/29/16; patient arrives today having tolerated the Profore wrap. He brought in his stockings and these are 18 mmHg stockings he bought from [place]. The compression here is likely inadequate. He does not complain of pain or excessive drainage she has no systemic symptoms. The wound on the right looks improved as does the one on the left although one on the left is more substantial with still tissue at risk below the actual wound area on the bilateral posterior calf 02/05/16;  patient arrives with poor edema control. He states that we did put a 4 layer compression on it last week. No weight appear 5 this. 02/12/16; the area on the posterior right Has healed. The left Has a substantial wound that has necrotic surface eschar that requires a debridement with a curette. 02/16/16;the patient called or a Nurse visit secondary to increased swelling. He had been in earlier in the week with his right leg healed. He was transitioned to is on pressure stocking on the right leg with the only open wound on the left, a substantial area on the left posterior calf. Note he has a history of severe lower extremity edema, he has a history of chronic atrial fibrillation but not heart failure per my notes but I'll need to research this. He is not complaining of chest pain shortness of breath or orthopnea. The intake nurse noted blisters on the previously closed right leg 02/19/16; this is the patient's regular visit day. I see him on Friday with escalating edema new wounds on the right leg and clear signs of at least right ventricular heart failure. I increased his Lasix to 40 twice a day. He is returning currently in follow-up. States he is noticed a decrease in that the edema 02/26/16 patient's legs have much less edema. There is nothing really open on the right leg. The left leg has improved condition of the large superficial wound on the posterior left leg 03/04/16; edema control is very much better. The patient's right leg wounds have healed. On the left leg he continues to have severe venous inflammation on the posterior aspect of the left leg. There is no tenderness and I don't think any of  this is cellulitis. 03/11/16; patient's right leg is married healed and he is in his own stocking. The patient's left leg has deteriorated somewhat. There is a lot of erythema around the wound on the posterior left leg. There is also a significant rim of erythema posteriorly just above where the wrap would've  ended there is a new wound in this location and a lot of tenderness. Can't rule out cellulitis in this area. 03/15/16; patient's right leg remains healed and he is in his own stocking. The patient's left leg is much better than last review. His major wound on the posterior aspect of his left Is almost fully epithelialized. He has 3 small injuries from the wraps. Really. Erythema seems a lot better on antibiotics 03/18/16; right leg remains healed and he is in his own stocking. The patient's left leg is much better. The area on the posterior aspect of the left calf is fully epithelialized. His 3 small injuries which were wrap injuries on the left are improved only one seems still open his erythema has resolved 03/25/16; patient's right leg remains healed and he is in his own stocking. There is no open area today on the left leg posterior leg is completely closed up. His wrap injuries at the superior aspect of his leg are also resolved. He looks as though he has some irritation on the dorsal ankle but this is fully epithelialized without evidence of infection. 03/28/16; we discharged this patient on Monday. Transitioned him into his own stocking. There were problems almost immediately with uncontrolled swelling weeping edema multiple some of which have opened. He does not feel systemically unwell in particular no chest pain no shortness of breath and he does not feel 04/08/16; the edema is under better control with the Profore light wrap but he still has pitting edema. There is one large wound anteriorly 2 on the medial aspect of his left leg and 3 small areas on the superior posterior calf. Drainage is not excessive he is tolerating a Profore light well 04/15/16; put a Profore wrap on him last week. This is controlled is edema however he had a lot of pain on his left anterior foot most of his wounds are healed 04/22/16 once again the patient has denuded areas on the left anterior foot which he states are  because his wrap slips up word. He saw his primary physician today is on Lasix 40 twice a day and states that he his weight is down 20 pounds over the last 3 months. 04/29/16: Much improved. left anterior foot much improved. He is now on Lasix 80 mg per day. Much improved edema control 05/06/16; I was hoping to be able to discharge him today however once again he has blisters at a low level of where the compression was placed last week mostly on his left lateral but also his left medial leg and a small area on the anterior part of the left foot. 05/09/16; apparently the patient went home after his appointment on 7/4 later in the evening developing pain in his upper medial thigh together with subjective fever and chills although his temperature was not taken. The pain was so intense he felt he would probably have to call 911. However he then remembered that he had leftover doxycycline from a previous round of antibiotics and took these. By the next morning he felt a lot better. He called and spoke to one of our nurses and I approved doxycycline over the phone thinking that this was in  relation to the wounds we had previously seen although they were definitely were not. The patient feels a lot better old fever no chills he is still working. Blood sugars are reasonably controlled 05/13/16; patient is back in for review of his cellulitis on his anterior medial upper thigh. He is taking doxycycline this is a lot better. Culture I did of the nodular area on the dorsal aspect of his foot grew MRSA this also looks a lot better. 05/20/16; the patient is cellulitis on the medial upper thigh has resolved. All of his wound areas including the left anterior foot, areas on the medial aspect of the left calf and the lateral aspect of the calf at all resolved. He has a new blister on the left dorsal foot at the level of the fourth toe this was excised. No evidence of infection 05/27/16; patient continues to complain weeping  edema. He has new blisterlike wounds on the left anterior lateral and posterior lateral calf at the top of his wrap levels. The area on his left anterior foot appears better. He is not complaining of fever, pain or pruritus in his feet. 05/30/16; the patient's blisters on his left anterior leg posterior calf all look improved. He did not increase the Lasix 100 mg as I suggested because he was going to run out of his 40 mg tablets. He is still having weeping edema of his toes 06/03/16; I renewed his Lasix at 80 mg once a day as he was about to run out when I last saw him. He is on 80 mg of Lasix now. I have asked him to cut down on the excessive amount of water he was drinking and asked him to drink according to his thirst mechanisms 06/12/2016 -- was seen 2 days ago and was supposed to wear his compression stockings at home but he is developed lymphedema and superficial blisters on the left lower extremity and hence came in for a review 06/24/16; the remaining wound is on his left anterior leg. He still has edema coming from between his toes. There is lymphedema here however his edema is generally better than when I last saw this. He has a history of atrial fibrillation but does not have a known history of congestive heart failure nevertheless I think he probably has this at least on a diastolic basis. 07/01/16 I reviewed his echocardiogram from January 2017. This was essentially normal. He did not have LVH, EF of 55-60%. His right ventricular function was normal although he did have trivial tricuspid and pulmonic regurgitation. This is not audible on exam however. I increased his Lasix to do massive edema in his legs well above his knees I think in early July. He was also drinking an excessive amount of water at the time. 07/15/16; missed his appointment last week because of the Labor Day holiday on Monday. He could not get another appointment later in the week. Started to feel the wrap digging in  superiorly so we remove the top half and the bottom half of his wrap. He has extensive erythema and blistering superiorly in the left leg. Very tender. Very swollen. Edema in his foot with leaking edema fluid. He has not been systemically unwell 07/22/16; the area on the left leg laterally required some debridement. The medial wounds look more stable. His wrap injury wounds appear to have healed. Edema and his foot is better, weeping edema is also better. He tells me he is meeting with the supplier of the external compression pumps  at work 08/05/16; the patient was on vacation last week in [facility]. His wrap is been on for an extended period of time. Also over the weekend he developed an extensive area of tender erythema across his anterior medial thigh. He took to doxycycline yesterday that he had leftover from a previous prescription. The patient complains of weeping edema coming out of his toes 08/08/16; I saw this patient on 10/2. He was tender across his anterior thigh. I put him on doxycycline. He returns today in follow-up. He does not have any open wounds on his lower leg, he still has edema weeping into his toes. 08/12/16; patient was seen back urgently today to follow-up for his extensive left thigh cellulitis/erysipelas. He comes back with a lot less swelling and erythema pain is much better. I believe I gave him Augmentin and Cipro. His wrap was cut down as he stated a roll down his legs. He developed blistering above the level of the wrap that remained. He has 2 open blisters and 1 intact. 08/19/16; patient is been doing his primary doctor who is increased his Lasix from 40-80 once a day or 80 already has less edema. Cellulitis has remained improved in the left thigh. 2 open areas on the posterior left calf 08/26/16; he returns today having new open blisters on the anterior part of his left leg. He has his compression pumps but is not yet been shown how to use some vital representative  from the supplier. 09/02/16 patient returns today with no open wounds on the left leg. Some maceration in his plantar toes 09/10/2016 -- Dr. Leanord Hawking had recently discharged him on 09/02/2016 and he has come right back with redness swelling and some open ulcers on his left lower extremity. He says this was caused by trying to apply his compression stockings and he's been unable to use this and has not been able to use his lymphedema pumps. He had some doxycycline leftover and he has started on this a few days ago. 09/16/16; there are no open wounds on his leg on the left and no evidence of cellulitis. 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 He does not complain of symptoms here. I am not clear use using his external compression pumps. 09/23/16; I have not seen this patient in 2 weeks. He canceled his appointment 10 days ago as he was going on vacation. He tells me that on Monday he noticed a large area on his posterior left leg which is been draining copiously and is reopened into a large wound. He is been using ABDs and the external part of his juxtalite, according to our nurse this was not on properly. 10/07/16; Still a substantial area on the posterior left leg. Using silver alginate 10/14/16; in general better although there is still open area which looks healthy. Still using silver alginate. He reminds me that this happen before he left for [facility]. T oday while he was showering in the morning. He had been using his juxtalite's 10/21/16; the area on his posterior left leg is fully epithelialized. However he arrives today with a large area of tender erythema in his medial and posterior left thigh just above the knee. I have marked the area. Once again he is reluctant to consider hospitalization. I treated him with oral antibiotics in the past for a similar situation with resolution I think with doxycycline however this area it seems  more extensive to me. He is  not complaining of fever but does have chills and says states he is thirsty. His blood sugar today was in the 140s at home 10/25/16 the area on his posterior left leg is fully epithelialized although there is still some weeping edema. The large area of tenderness and erythema in his medial and posterior left thigh is a lot less tender although there is still a lot of swelling in this thigh. He states he feels a lot better. He is on doxycycline and Augmentin that I started last week. This will continued until Tuesday, December 26. I have ordered a duplex ultrasound of the left thigh rule out DVT whether there is an abscess something that would need to be drained I would also like to know. 11/01/16; he still has weeping edema from a not fully epithelialized area on his left posterior calf. Most of the rest of this looks a lot better. He has completed his antibiotics. His thigh is a lot better. Duplex ultrasound did not show a DVT in the thigh 11/08/16; he comes in today with more Denuded surface epithelium from the posterior aspect of his calf. There is no real evidence of cellulitis. The superior aspect of his wrap appears to have put quite an indentation in his leg just below the knee and this may have contributed. He does not complain of pain or fever. We have been using silver alginate as the primary dressing. The area of cellulitis in the right thigh has totally resolved. He has been using his compression stockings once a week 11/15/16; the patient arrives today with more loss of epithelium from the posterior aspect of his left calf. He now has a fairly substantial wound in this area. The reason behind this deterioration isn't exactly clear although his edema is not well controlled. He states he feels he is generally more swollen systemically. He is not complaining of chest pain shortness of breath fever. T me he has an appointment with his primary physician in early  February. He is on 80 mg of oral ells Lasix a day. He claims compliance with the external compression pumps. He is not having any pain in his legs similar to what he has with his recurrent cellulitis 11/22/16; the patient arrives a follow-up of his large area on his left lateral calf. This looks somewhat better today. He came in earlier in the week for a dressing change since I saw him a week ago. He is not complaining of any pain no shortness of breath no chest pain 11/28/16; the patient arrives for follow-up of his large area on the left lateral calf this does not look better. In fact it is larger weeping edema. The surface of the wound does not look too bad. We have been using silver alginate although I'm not certain that this is a dressing issue. 12/05/16; again the patient follows up for a large wound on the left lateral and left posterior calf this does not look better. There continues to be weeping edema necrotic surface tissue. More worrisome than this once again there is erythema below the wound involving the distal Achilles and heel suggestive of cellulitis. He is on his feet working most of the day of this is not going well. We are changing his dressing twice a week to facilitate the drainage. 12/12/16; not much change in the overall dimensions of the large area on the left posterior calf. This is very inflamed looking. I gave him an. Doxycycline last week does not really seem to have  helped. He found the wrap very painful indeed it seems to of dog into his legs superiorly and perhaps around the heel. He came in early today because the drainage had soaked through his dressings. 12/19/16- patient arrives for follow-up evaluation of his left lower extremity ulcers. He states that he is using his lymphedema pumps once daily when there is "no drainage". He admits to not using his lipedema pumps while under current treatment. His blood sugars have been consistently between 150-200. 12/26/16; the patient  is not using his compression pumps at home because of the wetness on his feet. I've advised him that I think it's important for him to use this daily. He finds his feet too wet, he can put a plastic bag over his legs while he is in the pumps. Otherwise I think will be in a vicious circle. We are using silver alginate to the major area on his left posterior calf 01/02/17; the patient's posterior left leg has further of all into 3 open wounds. All of them covered with a necrotic surface. He claims to be using his compression pumps once a day. His edema control is marginal. Continue with silver alginate 01/10/17; the patient's left posterior leg actually looks somewhat better. There is less edema, less erythema. Still has 3 open areas covered with a necrotic surface requiring debridement. He claims to be using his compression pumps once a day his edema control is better 01/17/17; the patient's left posterior calf look better last week when I saw him and his wrap was changed 2 days ago. He has noted increasing pain in the left heel and arrives today with much larger wounds extensive erythema extending down into the entire heel area especially tender medially. He is not systemically unwell CBGs have been controlled no fever. Our intake nurse showed me limegreen drainage on his AVD pads. 01/24/17; his usual this patient responds nicely to antibiotics last week giving him Levaquin for presumed Pseudomonas. The whole entire posterior part of his leg is much better much less inflamed and in the case of his Achilles heel area much less tender. He has also had some epithelialization posteriorly there are still open areas here and still draining but overall considerably better 01/31/17- He has continue to tolerate the compression wraps. he states that he continues to use the lymphedema pumps daily, and can increase to twice daily on the weekends. He is voicing no complaints or concerns regarding his LLE ulcers 02/07/17-he  is here for follow-up evaluation. He states that he noted some erythema to the left medial and anterior thigh, which he states is new as of yesterday. He is concerned about recurrent cellulitis. He states his blood sugars have been slightly elevated, this morning in the 180s 02/14/17; he is here for follow-up evaluation. When he was last here there was erythema superiorly from his posterior wound in his anterior thigh. He was prescribed Levaquin however a culture of the wound surface grew MRSA over the phone I changed him to doxycycline on Monday and things seem to be a lot better. 02/24/17; patient missed his appointment on Friday therefore we changed his nurse visit into a physician visit today. Still using silver alginate on the large area of the posterior left thigh. He isn't new area on the dorsal left second toe 03/03/17; actually better today although he admits he has not used his external compression pumps in the last 2 days or so because of work responsibilities over the weekend. 03/10/17; continued improvement. External compression  pumps once a day almost all of his wounds have closed on the posterior left calf. Better edema control 03/17/17; in general improved. He still has 3 small open areas on the lateral aspect of his left leg however most of the area on the posterior part of his leg is epithelialized. He has better edema control. He has an ABD pad under his stocking on the right anterior lower leg although he did not let us look at that today. 03/24/17; patient arrives back in clinic today with no open areas however there are areas on the posterior left calf and anterior left calf that are less than 100% epithelialized. His edema is well controlled in the left lower leg. There is some pitting edema probably lymphedema in the left upper thigh. He uses compression pumps at home once per day. I tried to get him to do this twice a day although he is very reticent. 04/01/2017 -- for the last 2  days he's had significant redness, tenderness and weeping and came in for an urgent visit today. 04/07/17; patient still has 6 more days of doxycycline. He was seen by Dr. Meyer Russel last Wednesday for cellulitis involving the posterior aspect, lateral aspect of his Involving his heel. For the most part he is better there is less erythema and less weeping. He has been on his feet for 12 hours 2 over the weekend. Using his compression pumps once a day 04/14/17 arrives today with continued improvement. Only one area on the posterior left calf that is not fully epithelialized. He has intense bilateral venous inflammation associated with his chronic venous insufficiency disease and secondary lymphedema. We have been using silver alginate to the left posterior calf wound In passing he tells [place] today that the right leg but we have not seen in quite some time has an open area on it but he doesn't want [place] to look at this today states he will show this to [place] next week. 04/21/17; there is no open area on his left leg although he still reports some weeping edema. He showed [place] his right leg today which is the first time we've seen this leg in a long time. He has a large area of open wound on the right leg anteriorly healthy granulation. Quite a bit of swelling in the right leg and some degree of venous inflammation. He told us about the right leg in passing last week but states that deterioration in the right leg really only happened over the weekend 04/28/17; there is no open area on the left leg although there is an irritated part on the posterior which is like a wrap injury. The wound on the right leg which was new from last week at least to [place] is a lot better. 05/05/17; still no open area on the left leg. Patient is using his new compression stocking which seems to be doing a good job of controlling the edema. He states he is using his compression pumps once per day. The right leg still has an open wound although it is  better in terms of surface area. Required debridement. A lot of pain in the posterior right Achilles marked tenderness. Usually this type of presentation this patient gives concern for an active cellulitis 05/12/17; patient arrives today with his major wound from last week on the right lateral leg somewhat better. Still requiring debridement. He was using his compression stocking on the left leg however that is reopened with superficial wounds anteriorly he did not have an  open wound on this leg previously. He is still using his juxta light's once daily at night. He cannot find the time to do this in the morning as he has to be at work by 7 AM 05/19/17; right lateral leg wound looks improved. No debridement required. The concerning area is on the left posterior leg which appears to almost have a subcutaneous hemorrhagic component to it. We've been using silver alginate to all the wounds 05/26/17; the right lateral leg wound continues to look improved. However the area on the left posterior calf is a tightly adherent surface. [place] using silver alginate. Because of the weeping edema in his legs there is very little good alternatives. 06/02/17; the patient left here last week looking quite good. Major wound on the left posterior calf and a small one on the right lateral calf. Both of these look satisfactory. He tells me that by Wednesday he had noted increased pain in the left leg and drainage. He called on Thursday and Friday to get an appointment here but we were blocked. He did not go to urgent care or his primary physician. He thinks he had a fever on Thursday but did not actually take his temperature. He has not been using his compression pumps on the left leg because of pain. I advised him to go to the emergency room today for IV antibiotics for stents of left leg cellulitis but he has refused I have asked him to take 2 days off work to keep his leg elevated and he has refused this as well. In view of  this I'm going to call him and Augmentin and doxycycline. 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 However after a detailed discussion regarding the risk benefits and alternatives he refuses to get admitted to the hospital. With no other recourse I will continue him on oral antibiotics as before and hopefully he'll have his infectious disease consultation this week. 06/16/2017 -- the patient was seen today by the nurse practitioner at infectious disease Ms. Dixon. 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 He has also been advise Lotrimin cream locally. she also had wise IV antibiotics for his condition if it flares up 06/23/17; patient arrives today with drainage bilaterally although the remaining wound on the left posterior calf after cleaning up today "highlighter yellow drainage" did not look too bad. Unfortunately he has had breakdown on the right anterior leg [previously this leg had not been open and he is using a black stocking] he went to see infectious disease and is been put on clindamycin 150 mg daily, I did not verify the dose although I'm not familiar with using clindamycin in this dosing range, perhaps for prophylaxisoo 06/27/17; I brought this patient back today to follow-up on the wound deterioration on the right lower leg together with surrounding cellulitis. I started him on doxycycline 4 days ago. This area looks better however he comes in today with intense cellulitis on the medial part of his left thigh. This is not have a wound in this area. Extremely tender.  We've  been using silver alginate to the wounds on the right lower leg left lower leg with bilateral 4 layer compression he is using his external compression pumps once a day 07/04/17; patient's left medial thigh cellulitis looks better. He has not been using his compression pumps as his insert said it was contraindicated with cellulitis. His right leg continues to make improvements all the wounds are still open. We only have one remaining wound on the left posterior calf. Using silver alginate to all open areas. He is on doxycycline which I started a week ago and should be finishing I gave him Augmentin after Thursday's visit for the severe cellulitis on the left medial thigh which fortunately looks better 07/14/17; the patient's left medial thigh cellulitis has resolved. The cellulitis in his right lower calf on the right also looks better. All of his wounds are stable to improved we've been using silver alginate he has completed the antibiotics I have given him. He has clindamycin 150 mg once a day prescribed by infectious disease for prophylaxis, I've advised him to start this now. We have been using bilateral Unna boots over silver alginate to the wound areas 07/21/17; the patient is been to see infectious disease who noted his recurrent problems with cellulitis. He was not able to tolerate prophylactic clindamycin therefore he is on amoxicillin 500 twice a day. He also had a second daily dose of Lasix added By Dr. Oneta Rack but he is not taking this. Nor is he being completely compliant with his compression pumps a especially not this week. He has 2 remaining wounds one on the right posterior lateral lower leg and one on the left posterior medial lower leg. 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 The patient has Unna boots bilaterally. Still wounds on his right lateral, left medial, and a new open area on the left anterior  lateral lower leg 08/04/17; he remains on amoxicillin twice a day for prophylaxis of recurrent cellulitis. He has bilateral Unna boots for compression and silver alginate to his wounds. Arrives today with his legs looking as good as I have seen him in quite some time. Not surprisingly his wounds look better as well with improvement on the right lateral leg venous insufficiency wound and also the left medial leg. He is still using the compression pumps once a day 08/11/17; both legs appear to be doing better wounds on the right lateral and left medial legs look better. Skin on the right leg quite good. He is been using silver alginate as the primary dressing. I'm going to use Anasept gel calcium alginate and maintain all the secondary dressings 08/18/17; the patient continues to actually do quite well. The area on his right lateral leg is just about closed the left medial also looks better although it is still moist in this area. His edema is well controlled we have been using Anasept gel with calcium alginate and the usual secondary dressings, 4 layer compression and once daily use of his compression pumps "always been able to manage 09/01/17; the patient continues to do reasonably well in spite of his trip to [place]. The area on the right lateral leg is epithelialized. Left is much better but still open. He has more edema and more chronic erythema on the left leg [venous inflammation] 09/08/17; he arrives today with no open wound on the right lateral leg and decently controlled edema. Unfortunately his left leg is not nearly as  in his good situation as last week.he apparently had increasing edema starting on Saturday. He edema soaked through into his foot so used a plastic bag to walk around his home. The area on the medial right leg which was his open area is about the same however he has lost surface epithelium on the left lateral which is new and he has significant pain in the Achilles area of the  left foot. He is already on amoxicillin chronically for prophylaxis of cellulitis in the left leg 09/15/17; he is completed a week of doxycycline and the cellulitis in the left posterior leg and Achilles area is as usual improved. He still has a lot of edema and fluid soaking through his dressings. There is no open wound on the right leg. He saw infectious disease NP today 09/22/17;As usual 1 we transition him from our compression wraps to his stockings things did not go well. He has several small open areas on the right leg. He states this was caused by the compression wrap on his skin although he did not wear this with the stockings over them. He has several superficial areas on the left leg medially laterally posteriorly. He does not have any evidence of active cellulitis especially involving the left Achilles The patient is traveling from [facility] Saturday going to [facility]. He states he isn't attempting to get an appointment with a heel objects wound center there to change his dressings. I am not completely certain whether this will work 10/06/17; the patient came in on Friday for a nurse visit and the nurse reported that his legs actually look quite good. He arrives in clinic today for his regular follow-up visit. He has a new wound on his left third toe over the PIP probably caused by friction with his footwear. He has small areas on the left leg and a very superficial but epithelialized area on the right anterior lateral lower leg. Other than that his legs look as good as I've seen him in quite some time. We have been using silver alginate Review of systems; no chest pain no shortness of breath other than this a 10 point review of systems negative 10/20/17; seen by Dr. Meyer Russel last week. He had taken some antibiotics [doxycycline] that he had left over. Dr. Meyer Russel thought he had candida infection and declined to give him further antibiotics. He has a small wound remaining on the right  lateral leg several areas on the left leg including a larger area on the left posterior several left medial and anterior and a small wound on the left lateral. The area on the left dorsal third toe looks a lot better. ROS; Gen.; no fever, respiratory no cough no sputum Cardiac no chest pain other than this 10 point review of system is negative 10/30/17; patient arrives today having fallen in the bathtub 3 days ago. It took him a while to get up. He has pain and maceration in the wounds on his left leg which have deteriorated. He has not been using his pumps he also has some maceration on the right lateral leg. 11/03/17; patient continues to have weeping edema especially in the left leg. This saturates his dressings which were just put on on 12/27. As usual the doxycycline seems to take care of the cellulitis on his lower leg. He is not complaining of fever, chills, or other systemic symptoms. He states his leg feels a lot better on the doxycycline I gave him empirically. He also apparently gets injections at  his primary doctor's officeo Rocephin for cellulitis prophylaxis. I didn't ask him about his compression pump compliance today I think that's probably marginal. Arrives in the clinic with all of his dressings primary and secondary macerated full of fluid and he has bilateral edema 11/10/17; the patient's right leg looks some better although there is still a cluster of wounds on the right lateral. The left leg is inflamed with almost circumferential skin loss medially to laterally although we are still maintaining anteriorly. He does not have overt cellulitis there is a lot of drainage. He is not using compression pumps. We have been using silver alginate to the wound areas, there are not a lot of options here 11/17/17; the patient's right leg continues to be stable although there is still open wounds, better than last week. The inflammation in the left leg is better. Still loss of surface layer  epithelium especially posteriorly. There is no overt cellulitis in the amount of edema and his left leg is really quite good, tells me he is using his compression pumps once a day. 11/24/17; patient's right leg has a small superficial wound laterally this continues to improve. The inflammation in the left leg is still improving however we have continuous surface layer epithelial loss posteriorly. There is no overt cellulitis in the amount of edema in both legs is really quite good. He states he is using his compression pumps on the left leg once a day for 5 out of 7 days 12/01/17; very small superficial areas on the right lateral leg continue to improve. Edema control in both legs is better today. He has continued loss of surface epithelialization and left posterior calf although I think this is better. We have been using silver alginate with large number of absorptive secondary dressings 4 layer on the left Unna boot on the right at his request. He tells me he is using his compression pumps once a day 12/08/17; he has no open area on the right leg is edema control is good here. On the left leg however he has marked erythema and tenderness breakdown of skin. He has what appears to be a wrap injury just distal to the popliteal fossa. 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 There was swelling erythema and breakdown in the right leg. He has much worse skin breakdown in the left leg as well multiple open areas medially and posteriorly as well as laterally. He tells me he has been using his compression pumps but tells me he feels that the drainage out of his leg is worse when he uses a compression pumps. T be fair to him he is been  saying this o for a while however I don't know that I have really been listening to this. I wonder if the compression pumps are working properly 12/22/17;. Once again he arrives with severe erythema, weeping edema from the left greater than right leg. Noncompliance with compression pumps. New this visit he is complaining of pain on the lateral aspect of the right leg and the medial aspect of his right thigh. He apparently saw his cardiologist Dr. [name] who was ordered an echocardiogram area and I think this is a step in the right direction 12/25/17; started his doxycycline  Monday night. There is still intense erythema of the right leg especially in the anterior thigh although there is less tenderness. The erythema around the wound on the right lateral calf also is less tender. He still complaining of pain in the left heel. His wounds are about the same right lateral left medial left lateral. Superficial but certainly not close to closure. He denies being systemically unwell no fever chills no abdominal pain no diarrhea 12/29/17; back in follow-up of his extensive right calf and right thigh cellulitis. I added amoxicillin to cover possible doxycycline resistant strep. This seems to of done the trick he is in much less pain there is much less erythema and swelling. He has his echocardiogram at 11:00 this morning. X-ray of the left heel was also negative. 01/05/18; the patient arrived with his edema under much better control. Now that he is retired he is able to use his compression pumps daily and sometimes twice a day per the patient. He has a wound on the right leg the lateral wound looks better. Area on the left leg also looks a lot better. He has no evidence of cellulitis in his bilateral thighs I had a quick peak at his echocardiogram. He is in normal ejection fraction and normal left ventricular function. He has moderate pulmonary hypertension moderately reduced right ventricular function. One would  have to wonder about chronic sleep apnea although he says he doesn't snore. He'll review the echocardiogram with his cardiologist. 01/12/18; the patient arrives with the edema in both legs under exemplary control. He is using his compression pumps daily and sometimes twice daily. His wound on the right lateral leg is just about closed. He still has some weeping areas on the posterior left calf and lateral left calf although everything is just about closed here as well. I have spoken with Aldean Baker who is the patient's nurse practitioner and infectious disease. She was concerned that the patient had not understood that the parenteral penicillin injections he was receiving for cellulitis prophylaxis was actually benefiting him. I don't think the patient actually saw that I would tend to agree we were certainly dealing with less infections although he had a serious one last month. 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 He is healed. He'll be placed in juxtalite compression wraps and increase his lymphedema pumps to twice daily. We will follow up again next week to ensure there are no issues with the new regiment. 01/20/18-he is here for evaluation of bilateral lower extremity weeping edema. Yesterday he was placed in compression wrap to the right lower extremity and compression stocking to left lower shrubbery. He states he uses lymphedema pumps last night and again this morning and noted a blister to the left lower extremity. On exam he was noted to have drainage to the right lower extremity. He will be placed in Unna boots bilaterally and follow-up next week 01/26/18; patient was actually discharged a week ago to his own juxta light stockings only to return the next day with bilateral lower extremity weeping edema.he was placed in bilateral Unna boots. He arrives today with pain in the back of his left leg. There is no open area on the right leg however there  is a linear/wrap injury on the left leg and weeping edema on the left leg posteriorly. I spoke with infectious disease about 10 days ago. They were disappointed that the patient elected to discontinue prophylactic intramuscular penicillin shots as they felt it was  particularly beneficial in reducing the frequency of his cellulitis. I discussed this with the patient today. He does not share this view. He'll definitely need antibiotics today. Finally he is traveling to [place] and trauma leaving this Saturday and returning a week later and he does not travel with his pumps. He is going by car 01/30/18; patient was seen 4 days ago and brought back in today for review of cellulitis in the left leg posteriorly. I put him on amoxicillin this really hasn't helped as much as I might like. He is also worried because he is traveling to [facility] trauma by car. Finally we will be rewrapping him. There is no open area on the right leg over his left leg has multiple weeping areas as usual 02/09/18; The same wrap on for 10 days. He did not pick up the last doxycycline I prescribed for him. He apparently took 4 days worth he already had. There is nothing open on his right leg and the edema control is really quite good. He's had damage in the left leg medially and laterally especially probably related to the prolonged use of Unna boots 02/12/18; the patient arrived in clinic today for a nurse visit/wrap change. He complained of a lot of pain in the left posterior calf. He is taking doxycycline that I previously prescribed for him. Unfortunately even though he used his stockings and apparently used to compression pumps twice a day he has weeping edema coming out of the lateral part of his right leg. This is coming from the lower anterior lateral skin area. 02/16/18; the patient has finished his doxycycline and will finish the amoxicillin 2 days. The area of cellulitis in the left calf posteriorly has resolved. He is  no longer having any pain. He tells me he is using his compression pumps at least once a day sometimes twice. 02/23/18; the patient finished his doxycycline and Amoxil last week. On Friday he noticed a small erythematous circle about the size of a quarter on the left lower leg just above his ankle. This rapidly expanded and he now has erythema on the lateral and posterior part of the thigh. This is bright red. Also has an area on the dorsal foot just above his toes and a tender area just below the left popliteal fossa. He came off his prophylactic penicillin injections at his own insistence one or 2 months ago. This is obviously deteriorated since then 03/02/18; patient is on doxycycline and Amoxil. Culture I did last week of the weeping area on the back of his left calf grew group B strep. I have therefore renewed the amoxicillin 500 3 times a day for a further week. He has not been systemically unwell. Still complaining of an area of discomfort right under his left popliteal fossa. There is no open wound on the right leg. He tells me that he is using his pumps twice a day on most days 03/09/18; patient arrives in clinic today completing his amoxicillin today. The cellulitis on his left leg is better. Furthermore he tells me that he had intramuscular penicillin shots that his primary care office today. However he also states that the wrap on his right leg fell down shortly after leaving clinic last week. He developed a large blister that was present when he came in for a nurse visit later in the week and then he developed intense discomfort around this area.He tells me he is using his compression pumps 03/16/18; the patient has completed his doxycycline. The infectious  part of this/cellulitis in the left heel area left popliteal area is a lot better. He has 2 open areas on the right calf. Still areas on the left calf but this is a lot better as well. 03/24/18; the patient arrives complaining of pain in the  left popliteal area again. He thinks some of this is wrap injury. He has no open area on the right leg and really no open area on the left calf either except for the popliteal area. He claims to be compliant with the compression pumps 03/31/18; I gave him doxycycline last week because of cellulitis in the left popliteal area. This is a lot better although the surface epithelium is denuded off and response to this. He arrives today with uncontrolled edema in the right calf area as well as a fingernail injury in the right lateral calf. There is only a few open areas on the left 04/06/18; I gave him amoxicillin doxycycline over the last 2 weeks that the amoxicillin should be completing currently. He is not complaining of any pain or systemic symptoms. The only open areas see has is on the right lateral lower leg paradoxically I cannot see anything on the left lower leg. He tells me he is using his compression pumps twice a day on most days. Silver alginate to the wounds that are open under 4 layer compression 04/13/18; he completed antibiotics and has no new complaints. Using his compression pumps. Silver alginate that anything that's opened 04/20/18; he is using his compression pumps religiously. Silver alginate 4 layer compression anything that's opened. He comes in today with no open wounds on the left leg but 3 on the right including a new one posteriorly. He has 2 on the right lateral and one on the right posterior. He likes Unna boots on the right leg for reasons that aren't really clear we had the usual 4 layer compression on the left. It may be necessary to move to the 4 layer compression on the right however for now I left them in the Unna boots 04/27/18; he is using his compression pumps at least once a day. He has still the wounds on the right lateral calf. The area right posteriorly has closed. He does not have an open wound on the left under 4 layer compression however on the dorsal left foot just  proximal to the toes and the left third toe 2 small open areas were identified 05/11/18; he has not uses compression pumps. The areas on the right lateral calf have coalesced into one large wound necrotic surface. On the left side he has one small wound anteriorly however the edema is now weeping out of a large part of his left leg. He says he wasn't using his pumps because of the weeping fluid. I explained to him that this is the time he needs to pump more 05/18/18; patient states he is using his compression pumps twice a day. The area on the right lateral large wound albeit superficial. On the left side he has innumerable number of small new wounds on the left calf particularly laterally but several anteriorly and medially. All these appear to have healthy granulated base these look like the remnants of blisters however they occurred under compression. The patient arrives in clinic today with his legs somewhat better. There is certainly less edema, less multiple open areas on the left calf and the right anterior leg looks somewhat better as well superficial and a little smaller. However he relates pain and erythema  over the last 3-4 days in the thigh and I looked at this today. He has not been systemically unwell no fever no chills no change in blood sugar values 05/25/18; comes in today in a better state. The severe cellulitis on his left leg seems better with the Keflex. Not as tender. He has not been systemically unwell Hard to find an open wound on the left lower leg using his compression pumps twice a day The confluent wounds on his right lateral calf somewhat better looking. These will ultimately need debridement I didn't do this today. 06/01/18; the severe cellulitis on the left anterior thigh has resolved and he is completed his Keflex. There is no open wound on the left leg however there is a superficial excoriation at the base of the third toe dorsally. Skin on the bottom of his left foot  is macerated looking. The left the wounds on the lateral right leg actually looks some better although he did require debridement of the top half of this wound area with an open curet 06/09/18 on evaluation today patient appears to be doing poorly in regard to his right lower extremity in particular this appears to likely be infected he has very thick purulent discharge along with a bright green tent to the discharge. This makes me concerned about the possibility of pseudomonas. He's also having increased discomfort at this point on evaluation. Fortunately there does not appear to be any evidence of infection spreading to the other location at this time. 06/16/18 on evaluation today patient appears to actually be doing fairly well. His ulcer has actually diminished in size quite significantly at this point which is good news. Nonetheless he still does have some evidence of infection he did see infectious disease this morning before coming here for his appointment. I did review the results of their evaluation and their note today. They did actually have him discontinue the Cipro and initiate treatment with linezolid at this time. He is doing this for the next seven days and they recommended a follow-up in four months with them. He is the keep a log of the need for intermittent antibiotic therapy between now and when he falls back up with infectious disease. This will help them gaze what exactly they need to do to try and help them out. 06/23/18; the patient arrives today with no open wounds on the left leg and left third toe healed. He is been using his compression pumps twice a day. On the right lateral leg he still has a sizable wound but this is a lot better than last time I saw this. 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 Has been using silver alginate under 4 layer compression 06/30/18; the only  open wound he has is on the right lateral leg and this looks healthy. No debridement is required. We have been using silver alginate. He does not have an open wound on the left leg. There is apparently some drainage from the dorsal proximal third toe on the left although I see no open wound here. 07/03/18 on evaluation today patient was actually here just for a nurse visit rapid change. However when he was here on Wednesday for his rat change due to having been healed on the left and then developing blisters we initiated the wrap again knowing that he would be back today for [place] to reevaluate and see were at. Unfortunately  he has developed some cellulitis into the proximal portion of his right lower extremity even into the region of his thigh. 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 He was placed on one as what at that point. Nonetheless he is done with that and has been tolerating it well otherwise. Doxycycline which in the past really did not seem to be effective for him. Nonetheless I think the best option may be for [place] to definitely reinitiate the antibiotics for a longer period of time. 07/07/18; since I last saw this patient a week ago he has had a difficult time. At that point he did not have an open wound on his left leg. We transitioned him into juxta light stockings. He was apparently in the clinic the next day with blisters on the left lateral and left medial lower calf. He also had weeping edema fluid. He was put back into a compression wrap. He was also in the clinic on Friday with intense erythema in his right thigh. Per the patient he was started on Bactrim however that didn't work at all in terms of relieving his pain and swelling. He has taken 3 doxycycline that he had left over from last time and that seems to of helped. He has blistering on the right thigh as well. 07/14/18; the erythema on his right thigh has gotten better with doxycycline  that he is finishing. 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 Presumably this cellulitis in the thigh was not related to that although I think this is a bit concerning going forward. He still has an area on the right lateral calf the blister on the right medial calf just below the knee that was discussed above. On the left 2 small open areas left medial and left lateral. Edema control is adequate. He is using his compression pumps twice a day 07/20/18; continued improvement in the condition of both legs especially the edema in his bilateral thighs. He tells me he is been losing weight through a combination of diet and exercise. He is using his compression pumps twice a day. So overall she made to the remaining wounds 07/27/2018; continued improvement in condition of both legs. His edema is well controlled. The area on the right lateral leg is just about closed he had one blisters show up on the medial left upper calf. We have him in 4 layer compression. He is going on a 10-day trip to [place], [place]. He will be driving. He wants to wear Unna boots because of the lessening amount of constriction. He will not use compression pumps while he is away 08/05/18 on evaluation today patient actually appears to be doing decently well all things considered in regard to his bilateral lower extremities. The worst ulcer is actually only posterior aspect of his left lower extremity with a four layer compression wrap cut into his leg a couple weeks back. He did have a trip and actually had [employer] for the trip that he is worn since he was last here. Nonetheless he feels like the [employer] actually do better for him his swelling is up a little bit but he also with his trip was not taking his Lasix on a regular set schedule like he was supposed to be. He states that obviously the reason being that he cannot  drive and  keep going without having to urinate too frequently which makes it difficult. He did not have his pumps with him while he was away either which I think also maybe playing a role here too. 08/13/2018; the patient only has a small open wound on the right lateral calf which is a big improvement in the last month or 2. He also has the area posteriorly just below the posterior fossa on the left which I think was a wrap injury from several weeks ago. He has no current evidence of cellulitis. He tells me he is back into his compression pumps twice a day. He also tells me that while he was at the laundromat somebody stole a section of his extremitease stockings 08/20/2018; back in the clinic with a much improved state. He only has small areas on the right lateral mid calf which is just about healed. This was is more substantial area for quite a prolonged period of time. He has a small open area on the left anterior tibia. The area on the posterior calf just below the popliteal fossa is closed today. He is using his compression pumps twice a day 08/28/2018; patient has no open wound on the right leg. He has a smattering of open areas on the calf with some weeping lymphedema. More problematically than that it looks as though his wraps of slipped down in his usual he has very angry upper area of edema just below the right medial knee and on the right lateral calf. He has no open area on his feet. The patient is traveling to [facility] next week. I will send him in an antibiotic. We will continue to wrap the right leg. We ordered extremitease stockings for him last week and I plan to transition the right leg to a stocking when he gets home which will be in 10 days time. As usual he is very reluctant to take his pumps with him when he travels 09/07/2018; patient returns from [facility]. He shows me a picture of his left leg in the mid part of his trip last week with intense fire engine erythema.  The picture look bad enough I would have considered sending him to the hospital. Instead he went to the wound care center in [facility]. They did not prescribe him antibiotics but he did take some doxycycline he had leftover from a previous visit. I had given him trimethoprim sulfamethoxazole before he left this did not work according to the patient. This is resulted in some improvement fortunately. He comes back with a large wound on the left posterior calf. Smaller area on the left anterior tibia. Denuded blisters on the dorsal left foot over his toes. Does not have much in the way of wounds on the right leg although he does have a very tender area on the right posterior area just below the popliteal fossa also suggestive of infection. He promises me he is back on his pumps twice a day 09/15/2018; the intense cellulitis in his left lower calf is a lot better. The wound area on the posterior left calf is also so better. However he has reasonably extensive wounds on the dorsal aspect of his second and third toes and the proximal foot just at the base of the toes. There is nothing open on the right leg 09/22/2018; the patient has excellent edema control in his legs bilaterally. He is using his external compression pumps twice a day. He has no open area on the right leg and  only the areas in the left foot dorsally second and third toe area on the left side. He does not have any signs of active cellulitis. 10/06/2018; the patient has good edema control bilaterally. He has no open wound on the right leg. There is a blister in the posterior aspect of his left calf that we had to deal with today. He is using his compression pumps twice a day. There is no signs of active cellulitis. We have been using silver alginate to the wound areas. He still has vulnerable areas on the base of his left first second toes dorsally He has a his extremities stockings and we are going to transition him today into  the stocking on the right leg. He is cautioned that he will need to continue to use the compression pumps twice a day. If he notices uncontrolled edema in the right leg he may need to go to 3 times a day. 10/13/2018; the patient came in for a nurse check on Friday he has a large flaccid blister on the right medial calf just below the knee. We unroofed this. He has this and a new area underneath the posterior mid calf which was undoubtedly a blister as well. He also has several small areas on the right which is the area we put his extremities stocking on. 10/19/2018; the patient went to see infectious disease this morning I am not sure if that was a routine follow-up in any case the doxycycline I had given him was discontinued and started on linezolid. He has not started this. It is easy to look at his left calf and the inflammation and think this is cellulitis however he is very tender in the tissue just below the popliteal fossa and I have no doubt that there is infection going on here. He states the problem he is having is that with the compression pumps the edema goes down and then starts walking the wrap falls down. We will see if we can adhere this. He has 1 or 2 minuscule open areas on the right still areas that are weeping on the posterior left calf, the base of his left second and third toes 10/26/18; back today in clinic with quite of skin breakdown in his left anterior leg. This may have been infection the area below the popliteal fossa seems a lot better however tremendous epithelial loss on the left anterior mid tibia area over quite inexpensive tissue. He has 2 blisters on the right side but no other open wound here. 10/29/2018; came in urgently to see [place] today and we worked him in for review. He states that the 4 layer compression on the right leg caused pain he had to cut it down to roughly his mid calf this caused swelling above the wrap and he has blisters and skin breakdown today. As  a result of the pain he has not been using his pumps. Both legs are a lot more edematous and there is a lot of weeping fluid. 11/02/18; arrives in clinic with continued difficulties in the right leg> left. Leg is swollen and painful. multiple skin blisters and new open areas especially laterally. He has not been using his pumps on the right leg. 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 He is not systemically unwell. 11/09/2018; the patient claims he is being compliant with his pumps. He is finished the doxycycline I gave him last week. Culture I did of the wound on the right  lateral leg showed a few very resistant methicillin staph aureus. This was resistant to doxycycline. Nevertheless he states the pain in the leg is a lot better which makes me wonder if the cultured organism was not really what was causing the problem nevertheless this is a very dangerous organism to be culturing out of any wound. His right leg is still a lot larger than the left. He is using an Radio broadcast assistant on this area, he blames a 4-layer compression for causing the original skin breakdown which I doubt is true however I cannot talk him out of it. We have been using silver alginate to all of these areas which were initially blisters 11/16/2018; patient is being compliant with his external compression pumps at twice a day. Miraculously he arrives in clinic today with absolutely no open wounds. He has better edema control on the left where he has been using 4 layer compression versus wound of wounds on the right and I pointed this out to him. There is no inflammation in the skin in his lower legs which is also somewhat unusual for him. There is no open wounds on the dorsal left foot. He has extremitease stockings at home and I have asked him to bring these in next week. 11/25/18 patient's lower extremity on examination today on the left appears for the most part to be wound free. He does  have an open wound on the lateral aspect of the right lower extremity but this is minimal compared to what I've seen in past. He does request that we go ahead and wrap the left leg as well even though there's nothing open just so hopefully it will not reopen in short order. 1/28; patient has superficial open wounds on the right lateral calf left anterior calf and left posterior calf. His edema control is adequate. 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 We have been using silver alginate as the primary dressing. He claims compliance with his compression pumps 2/4; patient has superficial open wounds on numerous areas of his left calf and again one on the left dorsal foot. The areas on the right lateral calf have healed. The cellulitis that I gave him doxycycline for last week is also resolved this was mostly on the left anterior calf just below the tibial tuberosity. His edema looks fairly well-controlled. He tells me he went to see his primary doctor today and had blood work ordered 2/11; once again he has several open areas on the left calf left tibial area. Most of these are small and appear to have healthy granulation. He does not have anything open on the right. The edema and control in his thighs is pretty good which is usually a good indication he has been using his pumps as requested. 2/18; he continues to have several small areas on the left calf and left tibial area. Most of these are small healthy granulation. We put him in his stocking on the right leg last week and he arrives with a superficial open area over the right upper tibia and a fairly large area on the right lateral tibia in similar condition. His edema control actually does not look too bad, he claims to be using his compression pumps twice a day 2/25. Continued small areas on the left calf and left tibial area. New areas especially on the  right are identified just below the tibial tuberosity and on the right  upper tibia itself. There are also areas of weeping edema fluid even without an obvious wound. He does not have a considerable degree of lymphedema but clearly there is more edema here than his skin can handle. He states he is using the pumps twice a day. We have an Unna boot on the right and 4 layer compression on the left. 3/3; he continues to have an area on the right lateral calf and right posterior calf just below the popliteal fossa. There is a fair amount of tenderness around the wound on the popliteal fossa but I did not see any evidence of cellulitis, could just be that the wrap came down and rubbed in this area. He does not have an open area on the left leg however there is an area on the left dorsal foot at the base of the third toe We have been using silver alginate to all wound areas 3/10; he did not have an open area on his left leg last time he was here a week ago. T oday he arrives with a horizontal wound just below the tibial tuberosity and an area on the left lateral calf. He has intense erythema and tenderness in this area. The area is on the right lateral calf and right posterior calf better than last week. We have been using silver alginate as usual 3/18 - Patient returns with 3 small open areas on left calf, and 1 small open area on right calf, the skin looks ok with no significant erythema, he continues the UNA boot on right and 4 layer compression on left. The right lateral calf wound is closed , the right posterior is small area. we will continue silver alginate to the areas. 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 He is been tolerating the dressing changes without complication. Fortunately she has made  excellent improvement in regard to the overall status of his wounds. Unfortunately every time we cease wrapping him he ends up reopening in causing more significant issues at that point. Again I'm unsure of the best direction to take although I think the lymphedema clinic may be appropriate for him. 02/03/19 on evaluation today patient appears to be doing well in regard to the wounds that we saw him for last week unfortunately he has a new area on the proximal portion of his right medial/posterior lower extremity where the wrap somewhat slowed down and caused swelling and a blister to rub and open. Unfortunately this is the only opening that he has on either leg at this point. 02/17/19 on evaluation today patient's bilateral lower extremities appear to be doing well. He still completely healed in regard to the left lower extremity. In regard to the right lower extremity the area where the wrap and slid down and caused the blister still seems to be slightly open although this is dramatically better than during the last evaluation two weeks ago. I'm very pleased with the way this stands overall. 03/03/19 on evaluation today patient appears to be doing well in regard to his right lower extremity in general although he did have a new blister open this does not appear to be showing any evidence of active infection at this time. Fortunately there's No fevers, chills, nausea, or vomiting noted at this time. Overall I feel like he is making good progress  it does feel like that the right leg will we perform the [employer] seems to do with a bit better than three layer wrap on the left which slid down on him. We may switch to doing bilateral in the book wraps. 5/4; I have not seen Mr. Husmann in quite some time. According to our case manager he did not have an open wound on his left leg last week. He had 1 remaining wound on the right posterior medial calf. He arrives today with multiple openings on the left leg  probably were blisters and/or wrap injuries from Unna boots. I do not think the Unna boot's will provide adequate compression on the left. I am also not clear about the frequency he is using the compression pumps. 03/17/19 on evaluation today patient appears to be doing excellent in regard to his lower extremities compared to last week's evaluation apparently. He had gotten significantly worse last week which is unfortunate. The [employer] wrap on the left did not seem to do very well for him at all and in fact it didn't control his swelling significantly enough he had an additional outbreak. Subsequently we go back to the four layer compression wrap on the left. This is good news. At least in that he is doing better and the wound seem to be killing him. He still has not heard anything from the lymphedema clinic. 03/24/19 on evaluation today patient actually appears to be doing much better in regard to his bilateral lower Trinity as compared to last week when I saw him. Fortunately there's no signs of active infection at this time. He has been tolerating the dressing changes without complication. Overall I'm extremely pleased with the progress and appearance in general. 04/07/19 on evaluation today patient appears to be doing well in regard to his bilateral lower extremities. His swelling is significantly down from where it was previous. With that being said he does have a couple blisters still open at this point but fortunately nothing that seems to be too severe and again the majority of the larger openings has healed at this time. 04/14/19 on evaluation today patient actually appears to be doing quite well in regard to his bilateral lower extremities in fact I'm not even sure there's anything significantly open at this time at any site. Nonetheless he did have some trouble with these wraps where they are somewhat irritating him secondary to the fact that he has noted that the graph wasn't too close down  to the end of this foot in a little bit short as well up to his knee. Otherwise things seem to be doing quite well. 04/21/19 upon evaluation today patient's wound bed actually showed evidence of being completely healed in regard to both lower extremities which is excellent news. There does not appear to be any signs of active infection which is also good news. I'm very pleased in this regard. No fevers, chills, nausea, or vomiting noted at this time. 04/28/19 on evaluation today patient appears to be doing a little bit worse in regard to both lower extremities on the left mainly due to the fact that when he went infection disease the wrap was not wrapped quite high enough he developed a blister above this. On the right he is a small open area of nothing too significant but again this is continuing to give him some trouble he has been were in the Velcro compression that he has at home. 05/05/19 upon evaluation today patient appears to be doing  better with regard to his lower Trinity ulcers. He's been tolerating the dressing changes without complication. Fortunately there's no signs of active infection at this time. No fevers, chills, nausea, or vomiting noted at this time. We have been trying to get an appointment with her lymphedema clinic in [facility] but unfortunately nobody can get them on phone with not been able to even fax information over the patient likewise is not been able to get in touch with them. Overall I'm not sure exactly what's going on here with to reach out again today. 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 Still having a lot of drainage unfortunately. He tells me especially in the left but even on the right. There's no signs of active infection which is good news we've been using so ratcheted up to this point. 05/19/19 on evaluation today patient actually appears to be doing quite  well with regard to his left lower extremity which is great news. Fortunately in regard to the right lower extremity has an issues with his wrap and he subsequently did remove this from what I'm understanding. Nonetheless long story short is what he had rewrapped once he removed it subsequently had maggots underneath this wrap whenever he came in for evaluation today. With that being said they were obviously completely cleaned away by the nursing staff. The visit today which is excellent news. However he does appear to potentially have some infection around the right ankle region where the maggots were located as well. He will likely require anabiotic therapy today. 05/26/19 on evaluation today patient actually appears to be doing much better in regard to his bilateral lower extremities. I feel like the infection is under much better control. With that being said there were maggots noted when the wrap was removed yet again today. Again this could have potentially been left over from previous although at this time there does not appear to be any signs of significant drainage there was obviously on the wrap some drainage as well this contracted gnats or otherwise. Either way I do not see anything that appears to be doing worse in my pinion and in fact I think his drainage has slowed down quite significantly likely mainly due to the fact to his infection being under better control. 06/02/2019 on evaluation today patient actually appears to be doing well with regard to his bilateral lower extremities there is no signs of active infection at this time which is great news. With that being said he does have several open areas more so on the right than the left but nonetheless these are all significantly better than previously noted. 06/09/2019 on evaluation today patient actually appears to be doing well. His wrap stayed up and he did not cause any problems he had more drainage on the right compared to the left  but overall I do not see any major issues at this time which is great news. 06/16/2019 on evaluation today patient appears to be doing excellent with regard to his lower extremities the only area that is open is a new blister that can have opened as of today on the medial ankle on the left. Other than this he really seems to be doing great I see no major issues at this point. 06/23/2019 on evaluation today patient appears to be doing quite well with regard to his bilateral lower extremities. In fact he actually appears to be almost completely  healed there is a small area of weeping noted of the right lower extremity just above the ankle. Nonetheless fortunately there is no signs of active infection at this time which is good news. No fevers, chills, nausea, vomiting, or diarrhea. 8/24; the patient arrived for a nurse visit today but complained of very significant pain in the left leg and therefore I was asked to look at this. Noted that he did not have an open area on the left leg last week nevertheless this was wrapped. The patient states that he is not been able to put his compression pumps on the left leg because of the discomfort. He has not been systemically unwell 06/30/2019 on evaluation today patient unfortunately despite being excellent last week is doing much worse with regard to his left lower extremity today. In fact he had to come in for a nurse on Monday where his left leg had to be rewrapped due to excessive weeping Dr. Leanord Hawking placed him on doxycycline at that point. Fortunately there is no signs of active infection Systemically at this time which is good news. 07/07/2019 in regard to the patient's wounds today he actually seems to be doing well with his right lower extremity there really is nothing open or draining at this point this is great news. Unfortunately the left lower extremity is given him additional trouble at this time. There does not appear to be any signs of active infection  nonetheless he does have a lot of edema and swelling noted at this point as well as blistering all of which has led to a much more poor appearing leg at this time compared to where it was 2 weeks ago when it was almost completely healed. Obviously this is a little discouraging for the patient. He is try to contact the lymphedema clinic in [place] he has not been able to get through to them. 07/14/2019 on evaluation today patient actually appears to be doing slightly better with regard to his left lower extremity ulcers. Overall I do feel like at least at the top of the wrap that we have been placing this area has healed quite nicely and looks much better. The remainder of the leg is showing signs of improvement. Unfortunately in the thigh area he still has an open region on the left and again on the right he has been utilizing just a Band-Aid on an area that also opened on the thigh. Again this is an area that were not able to wrap although we did do an Ace wrap to provide some compression that something that obviously is a little less effective than the compression wraps we have been using on the lower portion of the leg. He does have an appointment with the lymphedema clinic in [facility] on Friday. 07/21/2019 on evaluation today patient appears to be doing better with regard to his lower extremity ulcers. He has been tolerating the dressing changes without complication. Fortunately there is no signs of active infection at this time. No fevers, chills, nausea, vomiting, or diarrhea. I did receive the paperwork from the physical therapist at the lymphedema clinic in [place]. Subsequently I signed off on that this morning and sent that back to him for further progression with the treatment plan. 07/28/2019 on evaluation today patient appears to be doing very well with regard to his right lower extremity where I do not see any open wounds at this point. Fortunately he is feeling great as far  as that is concerned as well. In regard  to the left lower extremity he has been having issues with still several areas of weeping and edema although the upper leg is doing better his lower leg still I think is going require the compression wrap at this time. No fevers, chills, nausea, vomiting, or diarrhea. 08/04/2019 on evaluation today patient unfortunately is having new wounds on the right lower extremity. Again we have been using Unna boot wrap on that side. We switched him to using his juxta lite wrap at home. With that being said he tells me he has been using it although his legs extremely swollen and to be honest really does not appear that he has been. I cannot know that for sure however. Nonetheless he has multiple new wounds on the right lower extremity at this time. Obviously we will have to see about getting this rewrapped for him today. 08/11/2019 on evaluation today patient appears to be doing fairly well with regard to his wounds. He has been tolerating the dressing changes including the compression wraps without complication. He still has a lot of edema in his upper thigh regions bilaterally he is supposed to be seeing the lymphedema clinic on the 15th of this month once his wraps arrive for the upper part of his legs. 08/18/2019 on evaluation today patient appears to be doing well with regard to his bilateral lower extremities at this point. He has been tolerating the dressing changes without complication. Fortunately there is no signs of active infection which is also good news. He does have a couple weeping areas on the first and second toe of the right foot he also has just a small area on the left foot upper leg and a small area on the left lower leg but overall he is doing quite well in my opinion. He is supposed to be getting his wraps shortly in fact tomorrow and then subsequently is seeing the lymphedema clinic next Wednesday on the 21st. Of note he is also leaving on the 25th to  go on vacation for a week to the beach. For that reason and since there is some uncertainty about what there can be doing at lymphedema clinic next Wednesday I am get a make an appointment for next Friday here for [place] to see what we need to do for him prior to him leaving for vacation. 10/23; patient arrives in considerable pain predominantly in the upper posterior calf just distal to the popliteal fossa also in the wound anteriorly above the major wound. This is probably cellulitis and he has had this recurrently in the past. He has no open wound on the right side and he has had an Radio broadcast assistant in that area. Finally I note that he has an area on the left posterior calf which by enlarge is mostly epithelialized. This protrudes beyond the borders of the surrounding skin in the setting of dry scaly skin and lymphedema. The patient is leaving for [facility] on Sunday. Per his longstanding pattern, he will not take his compression pumps with him predominantly out of fear that they will be stolen. He therefore asked that we put a Unna boot back on the right leg. He will also contact the wound care center in [facility] to see if they can change his dressing in the mid week. 11/3; patient returned from his vacation to [facility]. He was seen on 1 occasion at their wound care center. They did a 2 layer compression system as they did not have our 4-layer wrap. I am  not completely certain what they put on the wounds. They did not change the Unna boot on the right. The patient is also seeing a lymphedema specialist physical therapist in [name]. It appears that he has some compression sleeve for his thighs which indeed look quite a bit better than I am used to seeing. He pumps over these with his external compression pumps. 11/10; the patient has a new wound on the right medial thigh otherwise there is no open areas on the right. He has an area on the left leg posteriorly anteriorly and medially and an  area over the left second toe. We have been using silver alginate. He thinks the injury on his thigh is secondary to friction from the compression sleeve he has. 11/17; the patient has a new wound on the right medial thigh last week. He thinks this is because he did not have a underlying stocking for his thigh juxta lite apparatus. He now has this. The area is fairly large and somewhat angry but I do not think he has underlying cellulitis. He has a intact blister on the right anterior tibial area. Small wound on the right great toe dorsally Small area on the medial left calf. 11/30; the patient does not have any open areas on his right leg and we did not take his juxta lite stocking off. However he states that on Friday his compression wrap fell down lodging around his upper mid calf area. As usual this creates a lot of problems for him. He called urgently today to be seen for a nurse visit however the nurse visit turned into a provider visit because of extreme erythema and pain in the left anterior tibia extending laterally and posteriorly. The area that is problematic is extensive 10/06/2019 upon evaluation today patient actually appears to be doing poorly in regard to his left lower extremity. He Dr. Leanord Hawking did place him on doxycycline this past Monday apparently due to the fact that he was doing much worse in regard to this left leg. Fortunately the doxycycline does seem to be helping. Unfortunately we are still having a very difficult time getting his edema under any type of control in order to anticipate discharge at some point. The only way were really able to control his lymphedema really is with compression wraps and that has only even seemingly temporary. He has been seeing a lymphedema clinic they are trying to help in this regard but still this has been somewhat frustrating in general for the patient. 10/13/19 on evaluation today patient appears to be doing excellent with regard to his  right lower extremity as far as the wounds are concerned. His swelling is still quite extensive unfortunately. He is still having a lot of drainage from the thigh areas bilaterally which is unfortunate. He's been going to lymphedema clinic but again he still really does not have this edema under control as far as his lower extremities are concern. With regard to his left lower extremity this seems to be improving and I do believe the doxycycline has been of benefit for him. He is about to complete the doxycycline. 10/20/2019 on evaluation today patient appears to be doing poorly in regard to his bilateral lower extremities. More in the right thigh he has a lot of irritation at this site unfortunately. In regard to the left lower extremity the wrap was not quite as high it appears and does seem to have caused him some trouble as well. Fortunately there is no evidence of systemic  infection though he does have some blue-green drainage which has me concerned for the possibility of Pseudomonas. He tells me he is previously taking Cipro without complications and he really does not care for Levaquin however due to some of the side effects he has. He is not allergic to any medications specifically antibiotics that were aware of. 10/27/2019 on evaluation today patient actually does appear to be for the most part doing better when compared to last week's evaluation. With that being said he still has multiple open wounds over the bilateral lower extremities. He actually forgot to start taking the Cipro and states that he still has the whole bottle. He does have several new blisters on left lower extremity today I think I would recommend he go ahead and take the Cipro based on what I am seeing at this point. 12/30-Patient comes at 1 week visit, 4 layer compression wraps on the left and Unna boot on the right, primary dressing Xtrasorb and silver alginate. Patient is taking his Cipro and has a few more days left  probably 5-6, and the legs are doing better. He states he is using his compressions devices which I believe he has 11/10/2019 on evaluation today patient actually appears to be much better than last time I saw him 2 weeks ago. His wounds are significantly improved and overall I am very pleased in this regard. Fortunately there is no signs of active infection at this time. He is just a couple of days away from completing Cipro. Overall his edema is much better he has been using his lymphedema pumps which I think is also helping at this point. 11/17/2019 on evaluation today patient appears to be doing excellent in regard to his wounds in general. His legs are swollen but not nearly as much as they have been in the past. Fortunately he is tolerating the compression wraps without complication. No fevers, chills, nausea, vomiting, or diarrhea. He does have some erythema however in the distal portion of his right lower extremity specifically around the forefoot and toes there is a little bit of warmth here as well. 11/24/2019 on evaluation today patient appears to be doing well with regard to his right lower extremity I really do not see any open wounds at this point. His left lower extremity does have several open areas and his right medial thigh also is open. Other than this however overall the patient seems to be making good progress and I am very pleased at this point. 12/01/2019 on evaluation today patient appears to be doing poorly at this point in regard to his left lower extremity has several new blisters despite the fact that we have him in compression wraps. In fact he had a 4-layer compression wrap, his upper thigh wrapped from lymphedema clinic, and a juxta light over top of the 4 layer compression wrap the lymphedema clinic applied and despite all this he still develop blisters underneath. Obviously this does have me concerned about the fact that unfortunately despite what we are doing to try to get  wounds healed he continues to have new areas arise I do not think he is ever good to be at the point where he can realistically just use wraps at home to keep things under control. Typically when we heal him it takes about 1-2 days before he is back in the clinic with severe breakdown and blistering of his lower extremities bilaterally. This is happened numerous times in the past. Unfortunately I think that we may need  some help as far as overall fluid overload to kind of limit what we are seeing and get things under better control. 12/08/2019 on evaluation today patient presents for follow-up concerning his ongoing bilateral lower extremity edema. Unfortunately he is still having quite a bit of swelling the compression wraps are controlling this to some degree but he did see Dr. [name] his cardiologist I do have that available for review today as far as the appointment was concerned that was on 12/06/2019. Obviously that she has been 2 days ago. The patient states that he is only been taking the Lasix 80 mg 1 time a day he had told me previously he was taking this twice a day. Nonetheless Dr. [name] recommended this be up to 80 mg 2 times a day for the patient as he did appear to be fluid overloaded. With that being said the patient states he did this yesterday and he was unable to go anywhere or do anything due to the fact that he was constantly having to urinate. Nonetheless I think that this is still good to be something that is important for him as far as trying to get his edema under control at all things that he is going to be able to just expect his wounds to get under control and things to be better without going through at least a period of time where he is trying to stabilize his fluid management in general and I think increasing the Lasix is likely the first step here. It was also mentioned the possibility that the patient may require metolazone. With that being said he wanted to have the patient  take Lasix twice a day first and then reevaluating 2 months to see where things stand. 12/15/2019 upon evaluation today patient appears to be doing regard to his legs although his toes are showing some signs of weeping especially on the left at this point to some degree on the right. There does not appear to be any signs of active infection and overall I do feel like the compression wraps are doing well for him but he has not been able to take the Lasix at home and the increased dose that Dr. [name] recommended. He tells me that just not go to be feasible for him. Nonetheless I think in this case he should probably send a message to Dr. [name] in order to discuss options from the standpoint of possible admission to get the fluid off or otherwise going forward. 12/22/2019 upon evaluation today patient appears to be doing fairly well with regard to his lower extremities at this point. In fact he would be doing excellent if it was not for the fact that his right anterior thigh apparently had an allergic reaction to adhesive tape that he used. The wound itself that we have been monitoring actually appears to be healed. There is a lot of irritation at this point. 12/29/2019 upon evaluation today patient appears to be doing well in regard to his lower extremities. His left medial thigh is open and somewhat draining today but this is the only region that is open the right has done much better with the treatment utilizing the steroid cream that I prescribed for him last week. Overall I am pleased in that regard. Fortunately there is no signs of active infection at this time. No fevers, chills, nausea, vomiting, or diarrhea. 01/05/2020 upon evaluation today patient appears to be doing more poorly in regard to his right lower extremity at this point upon evaluation  today. Unfortunately he continues to have issues in this regard and I think the biggest issue is controlling his edema. This obviously is not very well  controlled at this point is been recommended that he use the Lasix twice a day but he has not been able to do that. Unfortunately I think this is leading to an issue where honestly he is not really able to effectively control his edema and therefore the wounds really are not doing significantly better. I do not think that he is going to be able to keep things under good control unless he is able to control his edema much better. I discussed this again in great detail with him today. 01/12/2020 good news is patient actually appears to be doing quite well today at this point. He does have an appointment with lymphedema clinic tomorrow. His legs appear healed and the toe on the left is almost completely healed. In general I am very pleased with how things stand at this point. 01/19/2020 upon evaluation today patient appears to actually be doing well in regard to his lower extremities there is nothing open at this point. Fortunately he has done extremely well more recently. Has been seeing lymphedema clinic as well. With that being said he has Velcro wraps for his lower legs as well as his upper legs. The only wound really is on his toe which is the right great toe and this is barely anything even there. With all that being said I think it is good to be appropriate today to go ahead and switch him over to the Velcro compression wraps. 01/26/2020 upon evaluation today patient appears to be doing worse with regard to his lower extremities after last week switch him to Velcro compression wraps. Unfortunately he lasted less than 24 hours he did not have the sock portion of his Velcro wrap on the left leg and subsequently developed a blister underneath the Velcro portion. Obviously this is not good and not what we were looking for at this point. He states the lymphedema clinic did tell him to wear the wrap for 23 hours and take him off for 1 I am okay with that plan but again right now we got a get things back  under control again he may have some cellulitis noted as well. 02/02/2020 upon evaluation today patient unfortunately appears to have several areas of blistering on his bilateral lower extremities today mainly on the feet. His legs do seem to be doing somewhat better which is good news. Fortunately there is no evidence of active infection at this time. No fevers, chills, nausea, vomiting, or diarrhea. 02/16/2020 upon evaluation today patient appears to be doing well at this time with regard to his legs. He has a couple weeping areas on his toes but for the most part everything is doing better and does appear to be sealed up on his legs which is excellent news. We can continue with wrapping him at this point as he had every time we discontinue the wraps he just breaks out with new wounds. There is really no point in is going forward with this at this point. 03/08/2020 upon evaluation today patient actually appears to be doing quite well with regard to his lower extremity ulcers. He has just a very superficial and really almost nonexistent blister on the left lower extremity he has in general done very well with the compression wraps. With that being said I do not see any signs of infection at this time which  is good news. 03/29/2020 upon evaluation today patient appears to be doing well with regard to his wounds currently except for where he had several new areas that opened up due to some of the wrap slipping and causing him trouble. He states he did not realize they had slipped. Nonetheless he has a 1 area on the right and 3 new areas on the left. Fortunately there is no signs of active infection at this time which is great news. 04/05/2020 upon evaluation today patient actually appears to be doing quite well in general in regard to his legs currently. Fortunately there is no signs of active infection at this time. No fevers, chills, nausea, vomiting, or diarrhea. He tells me next week that he will actually  be seen in the lymphedema clinic on Thursday at 10 AM I see him on Wednesday next week. 04/12/2020 upon evaluation today patient appears to be doing very well with regard to his lower extremities bilaterally. In fact he does not appear to have any open wounds at this point which is good news. Fortunately there is no signs of active infection at this time. No fevers, chills, nausea, vomiting, or diarrhea. 04/19/2020 upon evaluation today patient appears to be doing well with regard to his wounds currently on the bilateral lower extremities. There does not appear to be any signs of active infection at this time. Fortunately there is no evidence of systemic infection and overall very pleased at this point. Nonetheless after I held him out last week he literally had blisters the next morning already which swelled up with him being right back here in the clinic. Overall I think that he is just not can be able to be discharged with his legs the way they are he is much to volume overloaded as far as fluid is concerned and that was discussed with him today of also discussed this but should try the clinic nurse manager as well as Dr. Leanord Hawking. 04/26/2020 upon evaluation today patient appears to be doing better with regard to his wounds currently. He is making some progress and overall swelling is under good control with the compression wraps. Fortunately there is no evidence of active infection at this time. 05/10/2020 on evaluation today patient appears to be doing overall well in regard to his lower extremities bilaterally. He is Tolerating the compression wraps without complication and with what we are seeing currently I feel like that he is making excellent progress. There is no signs of active infection at this time. 05/24/2020 upon evaluation today patient appears to be doing well in regard to his legs. The swelling is actually quite a bit down compared to where it has been in the past. Fortunately there is no  sign of active infection at this time which is also good news. With that being said he does have several wounds on his toes that have opened up at this point. 05/31/2020 upon evaluation today patient appears to be doing well with regard to his legs bilaterally where he really has no significant fluid buildup at this point overall he seems to be doing quite well. Very pleased in this regard. With regard to his toes these also seem to be drying up which is excellent. We have continue to wrap him as every time we tried as a transition to the juxta light wraps things just do not seem to get any better. 06/07/2020 upon evaluation today patient appears to be doing well with regard to his right leg at this point.  Unfortunately left leg has a lot of blistering he tells me the wrap started to slide down on him when he tried to put his other Velcro wrap over top of it to help keep things in order but nonetheless still had some issues. 06/14/2020 on evaluation today patient appears to be doing well with regard to his lower extremity ulcers and foot ulcers at this point. I feel like everything is actually showing signs of improvement which is great news overall there is no signs of active infection at this time. No fevers, chills, nausea, vomiting, or diarrhea. 06/21/2020 on evaluation today patient actually appears to be doing okay in regard to his wounds in general. With that being said the biggest issue I see is on his right foot in particular the first and second toe seem to be doing a little worse due to the fact this is staying very wet. I think he is probably getting need to change out his dressings a couple times in between each week when we see him in regard to his toes in order to keep this drier based on the location and how this is proceeding. 06/28/2020 on evaluation today patient appears to be doing a little bit more poorly overall in regard to the appearance of the skin I am actually somewhat concerned  about the possibility of him having a little bit of an infection here. We discussed the course of potentially giving him a doxycycline prescription which he is taken previously with good result. With that being said I do believe that this is potentially mild and at this point easily fixed. I just do not want anything to get any worse. 07/12/2020 upon evaluation today patient actually appears to be making some progress with regard to his legs which is great news there does not appear to be any evidence of active infection. Overall very pleased with where things stand. 07/26/2020 upon evaluation today patient appears to be doing well with regard to his leg ulcers and toe ulcers at this point. He has been tolerating the compression wraps without complication overall very pleased in this regard. 08/09/2020 upon evaluation today patient appears to be doing well with regard to his lower extremities bilaterally. Fortunately there is no signs of active infection overall I am pleased with where things stand. 08/23/2020 on evaluation today patient appears to be doing well with regard to his wound. He has been tolerating the dressing changes without complication. Fortunately there is no signs of active infection at this time. Overall his legs seem to be doing quite well which is great news and I am very pleased in that regard. No fevers, chills, nausea, vomiting, or diarrhea. 09/13/2020 upon evaluation today patient appears to be doing okay in regard to his lower extremities. He does have a fairly large blister on the right leg which I did remove the blister tissue from today so we can get this to dry out other than that however he seems to be doing quite well. There is no signs of active infection at this time. 09/27/2020 upon evaluation today patient appears to actually be doing some better in regard to his right leg. Fortunately signs of active infection at this time which is great news. No fevers, chills,  nausea, vomiting, or diarrhea. 10/04/2020 upon evaluation today patient actually appears to be showing signs of improvement which is great news with regard to his leg ulcers. Fortunately there is no signs of active infection which is great news he is still taking  the antibiotics currently. No fevers, chills, nausea, vomiting, or diarrhea. 10/18/2020 on evaluation today patient appears to be doing well with regard to his legs currently. He has been tolerating the dressing changes including the wraps without complication. Fortunately there is no signs of active infection at this time. No fevers, chills, nausea, vomiting, or diarrhea. 10/25/2020 upon evaluation today patient appears to be doing decently well in regard to his wounds currently. He has been tolerating the dressing changes without complication. Overall I feel like he is making good progress albeit slow. Again this is something we can have to continue to wrap for some time to come most likely. 11/08/2020 upon evaluation today patient appears to be doing well with regard to his wounds currently. He has been tolerating the dressing changes without complication is not currently on any antibiotics and he does not appear to show any signs of infection. He does continue to have a lot of drainage on the right leg not too severe but nonetheless this is very scattered. On the left leg this is looking to be much improved overall. 11/15/2020 upon evaluation today patient appears to be doing better with regard to his legs bilaterally. Especially the right leg which was much more significant last week. There does not appear to be any signs of active infection which is great news. No fevers, chills, nausea, vomiting, or diarrhea. 11/23/2019 upon evaluation today patient appears to be doing poorly still in regard to his lower extremities bilaterally. Unfortunately his right leg in particular appears to be doing much more poorly there is no signs really of  infection this is not warm to touch but he does have a lot of drainage and weeping unfortunately. With that reason I do believe that we may need to initiate some treatment here to try to help calm down some of the swelling of the right leg. I think switching to a 4-layer compression wrap would be beneficial here. The patient is in agreement with giving this a try. 11/29/2020 upon evaluation today patient appears to be doing well currently in regard to his leg ulcers. I feel like the right leg is doing better he still has a lot of drainage but we do see some improvement here. The 4-layer compression wrap I think was helpful. 12/06/2020 upon evaluation today patient appears to be doing well with regard to his legs. In fact they seem to be doing about the best I have seen up to this point. Fortunately there is no signs of active infection at this time. No fevers, chills, nausea, vomiting, or diarrhea. 12/20/2020 upon evaluation today patient appears to be doing well at this time in regard to his legs. He is not having any significant draining which is great news. Fortunately there is no signs of active infection at this time. No fevers, chills, nausea, vomiting, or diarrhea. 01/17/2021 upon evaluation today Delroy actually appears to be doing excellent in regard to his legs. He has a few areas again that come and go as far as his toes are concerned but overall this is doing quite well. 01/31/2021 upon evaluation today patient appears to be doing well with regard to his legs. Fortunately there does not appear to be any signs of active infection which is great news. Overall he is still having significant edema despite the compression wraps basically the 4-layer compression wrap to just keep things under control there is really not much room for play. 4/13: Mr. Badal is a longstanding patient in our clinic and  benefits greatly from weekly compression wraps. Today he has no complaints. He has been tolerating the  wraps well. He states he is using the lymphedema pumps at home. 5/4; patient presents for follow-up of his chronic lymphedema/venous insufficiency ulcers. He comes weekly for compression wraps. He has no complaints today. He was unable to tolerate the Coflex 2 layer Last week so we will do the four press 4-layer compression. He has been using his lymphedema pumps daily. 5/18; patient presents for 2-week follow-up. He has no complaints or issues today. He has developed a new wound to the right foot on his fourth toe. He overall feels well and denies signs of infection. 6/1; patient presents for 2-week follow-up. He has no complaints or issues today. He denies signs of infection. 04/18/2021 upon evaluation today patient appears to be doing well with regard to his legs bilaterally. Family open wound is actually on the toe of his left foot everything else is completely closed which is great news. In general I am extremely pleased with where things stand at this point. The patient is also happy that things are doing so well. 05/02/2021 upon evaluation today patient's legs actually appear to be doing quite well today. Fortunately there does not appear to be any signs of active infection which is great and overall I am extremely pleased with where he stands today. The patient does not appear to have any evidence of active infection at this time which is also great news. 05/09/2021 upon evaluation today patient appears to be doing a little bit more poorly in regard to his legs. Unfortunately he is having issues with some breakdown and a blood blister on the left leg this is due to I believe honestly to how it was wrapped last week. Fortunately there does not appear to be any signs of infection but nonetheless this is still a concern to be honest. No fevers, chills, nausea, vomiting, or diarrhea. 05/16/2021 upon evaluation today patient appears to be doing significantly better as compared to last week. I am very  pleased with where things stand today. There does not appear to be any signs of infection which is great news and overall very pleased with where we stand. No fevers, chills, nausea, vomiting, or diarrhea. 05/30/2021 upon evaluation today patient appears to be doing well with regard to his legs. He has been tolerating the dressing changes without complication. Fortunately there does not appear to be any signs of active infection which is great news and overall I am extremely pleased with where things stand today. No fevers, chills, nausea, vomiting, or diarrhea. 06/20/2021 upon evaluation today patient actually appears to be making good progress today and very pleased with what we are seeing. I think his legs are really maintaining. As long as we continue wrapping he seems to be doing excellent in my opinion. Fortunately there is no signs of active infection at this time. No fevers, chills, nausea, vomiting, or diarrhea. 07/11/2021 upon evaluation today patient actually appears to be making excellent progress at this time. Fortunately there does not appear to be any evidence of active infection which is great news and overall I am extremely pleased with where things stand today. No fevers, chills, nausea, vomiting, or diarrhea. 07/25/2021 upon evaluation today patient appears to be doing well currently in regard to his lower extremities. He has been making good progress here and I do not see anything that is actually open significantly today this is great news. No fevers, chills, nausea, vomiting, or  diarrhea. 08/08/2021 upon evaluation today patient appears to be doing well with regard to his wound. He has been tolerating the dressing changes without complication. With that being said unfortunately has a new area that opened up as far as his right posterior leg is concerned this was a blister he also has an area on the third toe right foot which also reopen. Fortunately there is no signs of active  infection at this time which is great news. No fevers, chills, nausea, vomiting, or diarrhea. 10/17; patient came in today at his request initially for a nurse visit because it but out of concern for deterioration in both his lower legs and cellulitis I was asked to look at him. He comes in with increased swelling which he says started over the weekend he started to notice pain as well in his left medial ankle, right knee, left knee left dorsal foot. His wraps fell down contributing to some of this but he has not been using his compression pumps over the weekend for reasons that are not really clear. He comes in with multiple new wounds including the right posterior leg, right third toe, right fourth toe, left second toe left medial ankle left dorsal ankle and right anterior lower leg Electronic Signature(s) Signed: 08/20/2021 5:32:36 PM By: Baltazar Najjar MD Entered By: Baltazar Najjar on 08/20/2021 17:11:56 -------------------------------------------------------------------------------- Physical Exam Details Patient Name: Date of Service: [name], [name] 08/20/2021 3:45 PM Medical Record Number: [id number] Patient Account Number: [id number] Date of Birth/Sex: Treating RN: 20-Aug-1951 (70 y.o. [name] Primary Care Provider: Nicoletta Ba Other Clinician: Referring Provider: Treating Provider/Extender: Suzzette Righter in Treatment: 291 Constitutional Sitting or standing Blood Pressure is within target range for patient.. Pulse regular and within target range for patient.Marland Kitchen Respirations regular, non-labored and within target range.. Temperature is normal and within the target range for the patient.Marland Kitchen Appears in no distress. Cardiovascular Poorly controlled edema to one third the way below his knees the left greater than right foot.. Notes Wound exam; multiple wound areas are open here as described in the HPI. Most concerning is very intense erythema on the  right anterior knee left medial ankle left dorsal foot. Very tender. Electronic Signature(s) Signed: 08/20/2021 5:32:36 PM By: Baltazar Najjar MD Entered By: Baltazar Najjar on 08/20/2021 17:09:59 -------------------------------------------------------------------------------- Physician Orders Details Patient Name: Date of Service: [facility], Caidin J. 08/20/2021 3:45 PM Medical Record Number: [id number] Patient Account Number: [id number] Date of Birth/Sex: Treating RN: Sep 23, 1951 (70 y.o. [name] Primary Care Provider: Nicoletta Ba Other Clinician: Referring Provider: Treating Provider/Extender: Suzzette Righter in Treatment: [phone] Verbal / Phone Orders: No Diagnosis Coding ICD-10 Coding Code Description [id number] Non-pressure chronic ulcer of other 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 of skin L97.829 Non-pressure chronic ulcer of other part of left lower leg with unspecified severity L97.519 Non-pressure chronic ulcer of other part of right foot with unspecified severity L97.812 Non-pressure chronic ulcer of other part of right lower leg with fat layer exposed I87.333 Chronic venous hypertension (idiopathic) with ulcer and inflammation of bilateral lower extremity I89.0 Lymphedema, not elsewhere classified E11.622 Type 2 diabetes mellitus with other skin ulcer E11.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 Leonard Schwartz 08/29/2021 Return A Nurse Visit: - Friday 08/24/21 Other: - Prescription for Doxycycline sent to pharmacy, take as directed. Bathing/ Shower/ Hygiene May shower with protection but do not get wound dressing(s) wet. Edema Control - Lymphedema /  SCD / Other Bilateral Lower Extremities Lymphedema Pumps. Use Lymphedema pumps on leg(s) 2-3 times a day for 45-60 minutes. If wearing any wraps or hose, do not remove them. Continue exercising as instructed. Elevate legs to the  level of the heart or above for 30 minutes daily and/or when sitting, a frequency of: - throughout the day Avoid standing for long periods of time. Exercise regularly Non Wound Condition Bilateral Lower Extremities Other Non Wound Condition Orders/Instructions: - lotion to both legs, 4 layer compression wraps both legs, silver alginate to any weeping areas Wound Treatment Wound #198 - Lower Leg Wound Laterality: Right, Posterior Cleanser: Soap and Water 1 x Per Week Discharge Instructions: May shower and wash wound with dial antibacterial soap and water prior to dressing change. Cleanser: Wound Cleanser 1 x Per Week Discharge Instructions: Cleanse the wound with wound cleanser prior to applying a clean dressing using gauze sponges, not tissue or cotton balls. Peri-Wound Care: Zinc Oxide Ointment 30g tube 1 x Per Week Discharge Instructions: Apply Zinc Oxide to periwound with each dressing change Peri-Wound Care: Sween Lotion (Moisturizing lotion) 1 x Per Week Discharge Instructions: Apply moisturizing lotion as directed Prim Dressing: KerraCel Ag Gelling Fiber Dressing, 4x5 in (silver alginate) 1 x Per Week ary Discharge Instructions: Apply silver alginate to wound bed as instructed Secondary Dressing: ABD Pad, 8x10 1 x Per Week Discharge Instructions: Apply over primary dressing as directed. Compression Wrap: FourPress (4 layer compression wrap) 1 x Per Week Discharge Instructions: ****UNNA BOOT FIRST LAYER APPLIED TO UPPER PORTION OF LOWER LEG.*** Wound #199 - T Third oe Wound Laterality: Right Cleanser: Soap and Water 1 x Per Week Discharge Instructions: May shower and wash wound with dial antibacterial soap and water prior to dressing change. Cleanser: Wound Cleanser 1 x Per Week Discharge Instructions: Cleanse the wound with wound cleanser prior to applying a clean dressing using gauze sponges, not tissue or cotton balls. Prim Dressing: KerraCel Ag Gelling Fiber Dressing, 2x2 in  (silver alginate) 1 x Per Week ary Discharge Instructions: Apply silver alginate to wound bed as instructed Secured With: Conforming Stretch Gauze Bandage, Sterile 2x75 (in/in) 1 x Per Week Discharge Instructions: Secure with stretch gauze as directed. Secured With: 14M Medipore H Soft Cloth Surgical Tape, 2x2 (in/yd) 1 x Per Week Discharge Instructions: Secure dressing with tape as directed. Wound #200 - Lower Leg Wound Laterality: Left, Medial Cleanser: Soap and Water 1 x Per Week Discharge Instructions: May shower and wash wound with dial antibacterial soap and water prior to dressing change. Cleanser: Wound Cleanser 1 x Per Week Discharge Instructions: Cleanse the wound with wound cleanser prior to applying a clean dressing using gauze sponges, not tissue or cotton balls. Peri-Wound Care: Zinc Oxide Ointment 30g tube 1 x Per Week Discharge Instructions: Apply Zinc Oxide to periwound with each dressing change Peri-Wound Care: Sween Lotion (Moisturizing lotion) 1 x Per Week Discharge Instructions: Apply moisturizing lotion as directed Prim Dressing: KerraCel Ag Gelling Fiber Dressing, 4x5 in (silver alginate) 1 x Per Week ary Discharge Instructions: Apply silver alginate to wound bed as instructed Secondary Dressing: ABD Pad, 8x10 1 x Per Week Discharge Instructions: Apply over primary dressing as directed. Compression Wrap: FourPress (4 layer compression wrap) 1 x Per Week Discharge Instructions: ****UNNA BOOT FIRST LAYER APPLIED TO UPPER PORTION OF LOWER LEG.*** Wound #201 - Lower Leg Wound Laterality: Right, Anterior Cleanser: Soap and Water 1 x Per Week Discharge Instructions: May shower and wash wound with dial antibacterial soap and water  prior to dressing change. Cleanser: Wound Cleanser 1 x Per Week Discharge Instructions: Cleanse the wound with wound cleanser prior to applying a clean dressing using gauze sponges, not tissue or cotton balls. Peri-Wound Care: Zinc Oxide Ointment  30g tube 1 x Per Week Discharge Instructions: Apply Zinc Oxide to periwound with each dressing change Peri-Wound Care: Sween Lotion (Moisturizing lotion) 1 x Per Week Discharge Instructions: Apply moisturizing lotion as directed Prim Dressing: KerraCel Ag Gelling Fiber Dressing, 4x5 in (silver alginate) 1 x Per Week ary Discharge Instructions: Apply silver alginate to wound bed as instructed Secondary Dressing: ABD Pad, 8x10 1 x Per Week Discharge Instructions: Apply over primary dressing as directed. Compression Wrap: FourPress (4 layer compression wrap) 1 x Per Week Discharge Instructions: ****UNNA BOOT FIRST LAYER APPLIED TO UPPER PORTION OF LOWER LEG.*** Wound #202 - T Fourth oe Wound Laterality: Right Cleanser: Soap and Water 1 x Per Week Discharge Instructions: May shower and wash wound with dial antibacterial soap and water prior to dressing change. Cleanser: Wound Cleanser 1 x Per Week Discharge Instructions: Cleanse the wound with wound cleanser prior to applying a clean dressing using gauze sponges, not tissue or cotton balls. Prim Dressing: KerraCel Ag Gelling Fiber Dressing, 2x2 in (silver alginate) 1 x Per Week ary Discharge Instructions: Apply silver alginate to wound bed as instructed Secured With: Conforming Stretch Gauze Bandage, Sterile 2x75 (in/in) 1 x Per Week Discharge Instructions: Secure with stretch gauze as directed. Secured With: 37M Medipore H Soft Cloth Surgical Tape, 2x2 (in/yd) 1 x Per Week Discharge Instructions: Secure dressing with tape as directed. Wound #203 - Ankle Wound Laterality: Right, Anterior Cleanser: Soap and Water 1 x Per Week Discharge Instructions: May shower and wash wound with dial antibacterial soap and water prior to dressing change. Cleanser: Wound Cleanser 1 x Per Week Discharge Instructions: Cleanse the wound with wound cleanser prior to applying a clean dressing using gauze sponges, not tissue or cotton balls. Peri-Wound Care: Zinc  Oxide Ointment 30g tube 1 x Per Week Discharge Instructions: Apply Zinc Oxide to periwound with each dressing change Peri-Wound Care: Sween Lotion (Moisturizing lotion) 1 x Per Week Discharge Instructions: Apply moisturizing lotion as directed Prim Dressing: KerraCel Ag Gelling Fiber Dressing, 4x5 in (silver alginate) 1 x Per Week ary Discharge Instructions: Apply silver alginate to wound bed as instructed Secondary Dressing: ABD Pad, 8x10 1 x Per Week Discharge Instructions: Apply over primary dressing as directed. Compression Wrap: FourPress (4 layer compression wrap) 1 x Per Week Discharge Instructions: ****UNNA BOOT FIRST LAYER APPLIED TO UPPER PORTION OF LOWER LEG.*** Patient Medications llergies: No Known Allergies A Notifications Medication Indication Start End cellulitis bilat l/e 08/20/2021 doxycycline monohydrate DOSE oral 100 mg capsule - 1 capsule oral bid for 10 days Electronic Signature(s) Signed: 08/20/2021 5:17:00 PM By: Baltazar Najjar MD Entered By: Baltazar Najjar on 08/20/2021 17:16:53 -------------------------------------------------------------------------------- Problem List Details Patient Name: Date of Service: [name], [name] 08/20/2021 3:45 PM Medical Record Number: [id number] Patient Account Number: [id number] Date of Birth/Sex: Treating RN: 07-Sep-1951 (70 y.o. 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 03/21/2021 No Yes L97.812  Non-pressure chronic ulcer of other part of right lower leg with fat layer 08/08/2021 No Yes exposed 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 of 01/22/2016 No Yes bilateral lower extremity I89.0 Lymphedema, not elsewhere classified 01/22/2016 No Yes E11.622 Type 2 diabetes mellitus with other skin ulcer 01/22/2016 No Yes L03.115 Cellulitis of right lower limb 12/22/2017 No Yes L03.116 Cellulitis of left lower limb 08/20/2021 No Yes E11.40 Type 2 diabetes mellitus with diabetic neuropathy, unspecified 01/22/2016 No Yes Inactive Problems ICD-10 Code Description Active Date Inactive Date 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 06/30/2018 06/30/2018 L97.511 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 of skin 06/30/2018 06/30/2018 Resolved Problems ICD-10 Code Description Active Date Resolved Date L97.211 Non-pressure chronic ulcer of right calf limited to breakdown of skin 06/30/2018 06/30/2018 L97.221 Non-pressure chronic ulcer of left calf limited to breakdown of skin 09/30/2016 09/30/2016 L03.116 Cellulitis of left lower limb 04/01/2017 04/01/2017 L97.211 Non-pressure chronic ulcer of right calf limited to breakdown of skin 06/30/2017 06/30/2017 Electronic Signature(s) Signed: 08/20/2021 5:32:36 PM By: Baltazar Najjar MD Entered By: Baltazar Najjar on 08/20/2021 17:04:38 -------------------------------------------------------------------------------- Progress Note Details Patient Name: Date of Service: [name], [name] 08/20/2021 3:45 PM Medical Record Number: [id number] Patient Account Number: [id number] Date of Birth/Sex: Treating RN: 1951-10-23 (70 y.o. [name] Primary Care Provider: Nicoletta Ba Other Clinician: Referring Provider: Treating Provider/Extender: Suzzette Righter in Treatment: 291 Subjective History of Present Illness (HPI) Referred by PCP  for consultation. Patient has long standing history of BLE venous stasis, no prior ulcerations. At beginning of month, developed cellulitis and weeping. Received IM Rocephin followed by Keflex and resolved. Wears compression stocking, appr 6 months old. Not sure strength. No present drainage. 01/22/16 this is a patient who is a type II diabetic on insulin. 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 He tells me he religiously wears pressure stockings of uncertain strength. He was here with weeping edema about 8 months ago but did not have an open wound. Roughly a month ago he had a reopening on his bilateral legs. He is been using bandages and Neosporin. He does not complain of pain. 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 Last BUN/creatinine I have is from 11/20/15 at 13 and 1.0 respectively 01/29/16; patient arrives today having tolerated the Profore wrap. He brought in his stockings and these are 18 mmHg stockings he bought from [employer]. The compression here is likely inadequate. He does not complain of pain or excessive drainage she has no systemic symptoms. The wound on the right looks improved as does the one on the left although one on the left is more substantial with still tissue at risk below the actual wound area on the bilateral posterior calf 02/05/16; patient arrives with poor edema control. He states that we did put a 4 layer compression on it last week. No weight appear 5 this. 02/12/16; the area on the posterior right Has healed. The left Has a substantial wound that has necrotic surface eschar that requires a debridement with a curette. 02/16/16;the patient called or a Nurse visit secondary to increased swelling. He had been in earlier in the week with his right leg healed. He was transitioned to is on pressure stocking on the right leg with the  only open wound on the left, a substantial area on the left posterior calf. Note he has a history of severe lower extremity edema, he has a history of chronic atrial fibrillation but not heart failure per my notes but I'll need to research this. He is not complaining of chest pain shortness of breath or orthopnea. The intake nurse noted blisters on the previously closed right leg 02/19/16; this is the patient's regular visit day. I see him on Friday with escalating edema new wounds on the right leg and clear signs of at least right ventricular heart failure. I increased his Lasix to 40 twice a day. He is returning currently in follow-up. States he is noticed a decrease in that the edema 02/26/16 patient's legs have much less edema. There is nothing really open on the right leg. The left leg has improved condition of the large superficial wound on the posterior left leg 03/04/16; edema control is very much better. The patient's right leg wounds have healed. On the left leg he continues to have severe venous inflammation on the posterior aspect of the left leg. There is no tenderness and I don't think any of this is cellulitis. 03/11/16; patient's right leg is married healed and he is in his own stocking. The patient's left leg has deteriorated somewhat. There is a lot of erythema around the wound on the posterior left leg. There is also a significant rim of erythema posteriorly just above where the wrap would've ended there is a new wound in this location and a lot of tenderness. Can't rule out cellulitis in this area. 03/15/16; patient's right leg remains healed and he is in his own stocking. The patient's left leg is much better than last review. His major wound on the posterior aspect of his left Is almost fully epithelialized. He has 3 small injuries from the wraps. Really. Erythema seems a lot better on antibiotics 03/18/16; right leg remains healed and he is in his own stocking. The patient's left leg is  much better. The area on the posterior aspect of the left calf is fully epithelialized. His 3 small injuries which were wrap injuries on the left are improved only one seems still open his erythema has resolved 03/25/16; patient's right leg remains healed and he is in his own stocking. There is no open area today on the left leg posterior leg is completely closed up. His wrap injuries at the superior aspect of his leg are also resolved. He looks as though he has some irritation on the dorsal ankle but this is fully epithelialized without evidence of infection. 03/28/16; we discharged this patient on Monday. Transitioned him into his own stocking. There were problems almost immediately with uncontrolled swelling weeping edema multiple some of which have opened. He does not feel systemically unwell in particular no chest pain no shortness of breath and he does not feel 04/08/16; the edema is under better control with the Profore light wrap but he still has pitting edema. There is one large wound anteriorly 2 on the medial aspect of his left leg and 3 small areas on the superior posterior calf. Drainage is not excessive he is tolerating a Profore light well 04/15/16; put a Profore wrap on him last week. This is controlled is edema however he had a lot of pain on his left anterior foot most of his wounds are healed 04/22/16 once again the patient has denuded areas on the left anterior foot which he states are because his wrap slips  up word. He saw his primary physician today is on Lasix 40 twice a day and states that he his weight is down 20 pounds over the last 3 months. 04/29/16: Much improved. left anterior foot much improved. He is now on Lasix 80 mg per day. Much improved edema control 05/06/16; I was hoping to be able to discharge him today however once again he has blisters at a low level of where the compression was placed last week mostly on his left lateral but also his left medial leg and a small area  on the anterior part of the left foot. 05/09/16; apparently the patient went home after his appointment on 7/4 later in the evening developing pain in his upper medial thigh together with subjective fever and chills although his temperature was not taken. The pain was so intense he felt he would probably have to call 911. However he then remembered that he had leftover doxycycline from a previous round of antibiotics and took these. By the next morning he felt a lot better. He called and spoke to one of our nurses and I approved doxycycline over the phone thinking that this was in relation to the wounds we had previously seen although they were definitely were not. The patient feels a lot better old fever no chills he is still working. Blood sugars are reasonably controlled 05/13/16; patient is back in for review of his cellulitis on his anterior medial upper thigh. He is taking doxycycline this is a lot better. Culture I did of the nodular area on the dorsal aspect of his foot grew MRSA this also looks a lot better. 05/20/16; the patient is cellulitis on the medial upper thigh has resolved. All of his wound areas including the left anterior foot, areas on the medial aspect of the left calf and the lateral aspect of the calf at all resolved. He has a new blister on the left dorsal foot at the level of the fourth toe this was excised. No evidence of infection 05/27/16; patient continues to complain weeping edema. He has new blisterlike wounds on the left anterior lateral and posterior lateral calf at the top of his wrap levels. The area on his left anterior foot appears better. He is not complaining of fever, pain or pruritus in his feet. 05/30/16; the patient's blisters on his left anterior leg posterior calf all look improved. He did not increase the Lasix 100 mg as I suggested because he was going to run out of his 40 mg tablets. He is still having weeping edema of his toes 06/03/16; I renewed his Lasix at  80 mg once a day as he was about to run out when I last saw him. He is on 80 mg of Lasix now. I have asked him to cut down on the excessive amount of water he was drinking and asked him to drink according to his thirst mechanisms 06/12/2016 -- was seen 2 days ago and was supposed to wear his compression stockings at home but he is developed lymphedema and superficial blisters on the left lower extremity and hence came in for a review 06/24/16; the remaining wound is on his left anterior leg. He still has edema coming from between his toes. There is lymphedema here however his edema is generally better than when I last saw this. He has a history of atrial fibrillation but does not have a known history of congestive heart failure nevertheless I think he probably has this at least on a diastolic  basis. 07/01/16 I reviewed his echocardiogram from January 2017. This was essentially normal. He did not have LVH, EF of 55-60%. His right ventricular function was normal although he did have trivial tricuspid and pulmonic regurgitation. This is not audible on exam however. I increased his Lasix to do massive edema in his legs well above his knees I think in early July. He was also drinking an excessive amount of water at the time. 07/15/16; missed his appointment last week because of the Labor Day holiday on Monday. He could not get another appointment later in the week. Started to feel the wrap digging in superiorly so we remove the top half and the bottom half of his wrap. He has extensive erythema and blistering superiorly in the left leg. Very tender. Very swollen. Edema in his foot with leaking edema fluid. He has not been systemically unwell 07/22/16; the area on the left leg laterally required some debridement. The medial wounds look more stable. His wrap injury wounds appear to have healed. Edema and his foot is better, weeping edema is also better. He tells me he is meeting with the supplier of the external  compression pumps at work 08/05/16; the patient was on vacation last week in [facility]. His wrap is been on for an extended period of time. Also over the weekend he developed an extensive area of tender erythema across his anterior medial thigh. He took to doxycycline yesterday that he had leftover from a previous prescription. The patient complains of weeping edema coming out of his toes 08/08/16; I saw this patient on 10/2. He was tender across his anterior thigh. I put him on doxycycline. He returns today in follow-up. He does not have any open wounds on his lower leg, he still has edema weeping into his toes. 08/12/16; patient was seen back urgently today to follow-up for his extensive left thigh cellulitis/erysipelas. He comes back with a lot less swelling and erythema pain is much better. I believe I gave him Augmentin and Cipro. His wrap was cut down as he stated a roll down his legs. He developed blistering above the level of the wrap that remained. He has 2 open blisters and 1 intact. 08/19/16; patient is been doing his primary doctor who is increased his Lasix from 40-80 once a day or 80 already has less edema. Cellulitis has remained improved in the left thigh. 2 open areas on the posterior left calf 08/26/16; he returns today having new open blisters on the anterior part of his left leg. He has his compression pumps but is not yet been shown how to use some vital representative from the supplier. 09/02/16 patient returns today with no open wounds on the left leg. Some maceration in his plantar toes 09/10/2016 -- Dr. Leanord Hawking had recently discharged him on 09/02/2016 and he has come right back with redness swelling and some open ulcers on his left lower extremity. He says this was caused by trying to apply his compression stockings and he's been unable to use this and has not been able to use his lymphedema pumps. He had some doxycycline leftover and he has started on this a few days  ago. 09/16/16; there are no open wounds on his leg on the left and no evidence of cellulitis. 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 He does not complain of symptoms here. I am not clear use using his external compression pumps. 09/23/16; I have not seen this  patient in 2 weeks. He canceled his appointment 10 days ago as he was going on vacation. He tells me that on Monday he noticed a large area on his posterior left leg which is been draining copiously and is reopened into a large wound. He is been using ABDs and the external part of his juxtalite, according to our nurse this was not on properly. 10/07/16; Still a substantial area on the posterior left leg. Using silver alginate 10/14/16; in general better although there is still open area which looks healthy. Still using silver alginate. He reminds me that this happen before he left for [facility]. T oday while he was showering in the morning. He had been using his juxtalite's 10/21/16; the area on his posterior left leg is fully epithelialized. However he arrives today with a large area of tender erythema in his medial and posterior left thigh just above the knee. I have marked the area. Once again he is reluctant to consider hospitalization. I treated him with oral antibiotics in the past for a similar situation with resolution I think with doxycycline however this area it seems more extensive to me. He is not complaining of fever but does have chills and says states he is thirsty. His blood sugar today was in the 140s at home 10/25/16 the area on his posterior left leg is fully epithelialized although there is still some weeping edema. The large area of tenderness and erythema in his medial and posterior left thigh is a lot less tender although there is still a lot of swelling in this thigh. He states he feels a lot better. He is on doxycycline and Augmentin that I started last week.  This will continued until Tuesday, December 26. I have ordered a duplex ultrasound of the left thigh rule out DVT whether there is an abscess something that would need to be drained I would also like to know. 11/01/16; he still has weeping edema from a not fully epithelialized area on his left posterior calf. Most of the rest of this looks a lot better. He has completed his antibiotics. His thigh is a lot better. Duplex ultrasound did not show a DVT in the thigh 11/08/16; he comes in today with more Denuded surface epithelium from the posterior aspect of his calf. There is no real evidence of cellulitis. The superior aspect of his wrap appears to have put quite an indentation in his leg just below the knee and this may have contributed. He does not complain of pain or fever. We have been using silver alginate as the primary dressing. The area of cellulitis in the right thigh has totally resolved. He has been using his compression stockings once a week 11/15/16; the patient arrives today with more loss of epithelium from the posterior aspect of his left calf. He now has a fairly substantial wound in this area. The reason behind this deterioration isn't exactly clear although his edema is not well controlled. He states he feels he is generally more swollen systemically. He is not complaining of chest pain shortness of breath fever. T me he has an appointment with his primary physician in early February. He is on 80 mg of oral ells Lasix a day. He claims compliance with the external compression pumps. He is not having any pain in his legs similar to what he has with his recurrent cellulitis 11/22/16; the patient arrives a follow-up of his large area on his left lateral calf. This looks somewhat better today. He came  in earlier in the week for a dressing change since I saw him a week ago. He is not complaining of any pain no shortness of breath no chest pain 11/28/16; the patient arrives for follow-up of his  large area on the left lateral calf this does not look better. In fact it is larger weeping edema. The surface of the wound does not look too bad. We have been using silver alginate although I'm not certain that this is a dressing issue. 12/05/16; again the patient follows up for a large wound on the left lateral and left posterior calf this does not look better. There continues to be weeping edema necrotic surface tissue. More worrisome than this once again there is erythema below the wound involving the distal Achilles and heel suggestive of cellulitis. He is on his feet working most of the day of this is not going well. We are changing his dressing twice a week to facilitate the drainage. 12/12/16; not much change in the overall dimensions of the large area on the left posterior calf. This is very inflamed looking. I gave him an. Doxycycline last week does not really seem to have helped. He found the wrap very painful indeed it seems to of dog into his legs superiorly and perhaps around the heel. He came in early today because the drainage had soaked through his dressings. 12/19/16- patient arrives for follow-up evaluation of his left lower extremity ulcers. He states that he is using his lymphedema pumps once daily when there is "no drainage". He admits to not using his lipedema pumps while under current treatment. His blood sugars have been consistently between 150-200. 12/26/16; the patient is not using his compression pumps at home because of the wetness on his feet. I've advised him that I think it's important for him to use this daily. He finds his feet too wet, he can put a plastic bag over his legs while he is in the pumps. Otherwise I think will be in a vicious circle. We are using silver alginate to the major area on his left posterior calf 01/02/17; the patient's posterior left leg has further of all into 3 open wounds. All of them covered with a necrotic surface. He claims to be using his  compression pumps once a day. His edema control is marginal. Continue with silver alginate 01/10/17; the patient's left posterior leg actually looks somewhat better. There is less edema, less erythema. Still has 3 open areas covered with a necrotic surface requiring debridement. He claims to be using his compression pumps once a day his edema control is better 01/17/17; the patient's left posterior calf look better last week when I saw him and his wrap was changed 2 days ago. He has noted increasing pain in the left heel and arrives today with much larger wounds extensive erythema extending down into the entire heel area especially tender medially. He is not systemically unwell CBGs have been controlled no fever. Our intake nurse showed me limegreen drainage on his AVD pads. 01/24/17; his usual this patient responds nicely to antibiotics last week giving him Levaquin for presumed Pseudomonas. The whole entire posterior part of his leg is much better much less inflamed and in the case of his Achilles heel area much less tender. He has also had some epithelialization posteriorly there are still open areas here and still draining but overall considerably better 01/31/17- He has continue to tolerate the compression wraps. he states that he continues to use the lymphedema pumps  daily, and can increase to twice daily on the weekends. He is voicing no complaints or concerns regarding his LLE ulcers 02/07/17-he is here for follow-up evaluation. He states that he noted some erythema to the left medial and anterior thigh, which he states is new as of yesterday. He is concerned about recurrent cellulitis. He states his blood sugars have been slightly elevated, this morning in the 180s 02/14/17; he is here for follow-up evaluation. When he was last here there was erythema superiorly from his posterior wound in his anterior thigh. He was prescribed Levaquin however a culture of the wound surface grew MRSA over the phone I  changed him to doxycycline on Monday and things seem to be a lot better. 02/24/17; patient missed his appointment on Friday therefore we changed his nurse visit into a physician visit today. Still using silver alginate on the large area of the posterior left thigh. He isn't new area on the dorsal left second toe 03/03/17; actually better today although he admits he has not used his external compression pumps in the last 2 days or so because of work responsibilities over the weekend. 03/10/17; continued improvement. External compression pumps once a day almost all of his wounds have closed on the posterior left calf. Better edema control 03/17/17; in general improved. He still has 3 small open areas on the lateral aspect of his left leg however most of the area on the posterior part of his leg is epithelialized. He has better edema control. He has an ABD pad under his stocking on the right anterior lower leg although he did not let us look at that today. 03/24/17; patient arrives back in clinic today with no open areas however there are areas on the posterior left calf and anterior left calf that are less than 100% epithelialized. His edema is well controlled in the left lower leg. There is some pitting edema probably lymphedema in the left upper thigh. He uses compression pumps at home once per day. I tried to get him to do this twice a day although he is very reticent. 04/01/2017 -- for the last 2 days he's had significant redness, tenderness and weeping and came in for an urgent visit today. 04/07/17; patient still has 6 more days of doxycycline. He was seen by Dr. Meyer Russel last Wednesday for cellulitis involving the posterior aspect, lateral aspect of his Involving his heel. For the most part he is better there is less erythema and less weeping. He has been on his feet for 12 hours o2 over the weekend. Using his compression pumps once a day 04/14/17 arrives today with continued improvement. Only one area on  the posterior left calf that is not fully epithelialized. He has intense bilateral venous inflammation associated with his chronic venous insufficiency disease and secondary lymphedema. We have been using silver alginate to the left posterior calf wound In passing he tells [place] today that the right leg but we have not seen in quite some time has an open area on it but he doesn't want [place] to look at this today states he will show this to [place] next week. 04/21/17; there is no open area on his left leg although he still reports some weeping edema. He showed [place] his right leg today which is the first time we've seen this leg in a long time. He has a large area of open wound on the right leg anteriorly healthy granulation. Quite a bit of swelling in the right leg and some degree  of venous inflammation. He told us about the right leg in passing last week but states that deterioration in the right leg really only happened over the weekend 04/28/17; there is no open area on the left leg although there is an irritated part on the posterior which is like a wrap injury. The wound on the right leg which was new from last week at least to [place] is a lot better. 05/05/17; still no open area on the left leg. Patient is using his new compression stocking which seems to be doing a good job of controlling the edema. He states he is using his compression pumps once per day. The right leg still has an open wound although it is better in terms of surface area. Required debridement. A lot of pain in the posterior right Achilles marked tenderness. Usually this type of presentation this patient gives concern for an active cellulitis 05/12/17; patient arrives today with his major wound from last week on the right lateral leg somewhat better. Still requiring debridement. He was using his compression stocking on the left leg however that is reopened with superficial wounds anteriorly he did not have an open wound on this leg previously. He  is still using his juxta light's once daily at night. He cannot find the time to do this in the morning as he has to be at work by 7 AM 05/19/17; right lateral leg wound looks improved. No debridement required. The concerning area is on the left posterior leg which appears to almost have a subcutaneous hemorrhagic component to it. We've been using silver alginate to all the wounds 05/26/17; the right lateral leg wound continues to look improved. However the area on the left posterior calf is a tightly adherent surface. [place] using silver alginate. Because of the weeping edema in his legs there is very little good alternatives. 06/02/17; the patient left here last week looking quite good. Major wound on the left posterior calf and a small one on the right lateral calf. Both of these look satisfactory. He tells me that by Wednesday he had noted increased pain in the left leg and drainage. He called on Thursday and Friday to get an appointment here but we were blocked. He did not go to urgent care or his primary physician. He thinks he had a fever on Thursday but did not actually take his temperature. He has not been using his compression pumps on the left leg because of pain. I advised him to go to the emergency room today for IV antibiotics for stents of left leg cellulitis but he has refused I have asked him to take 2 days off work to keep his leg elevated and he has refused this as well. In view of this I'm going to call him and Augmentin and doxycycline. 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 However after a detailed discussion regarding the risk benefits and alternatives he refuses to get admitted to the hospital. With no other recourse I will continue him  on oral antibiotics as before and hopefully he'll have his infectious disease consultation this week. 06/16/2017 -- the patient was seen today by the nurse practitioner at infectious disease Ms. Dixon. 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 He has also been advise Lotrimin cream locally. she also had wise IV antibiotics for his condition if it flares up 06/23/17; patient arrives today with drainage bilaterally although the remaining wound on the left posterior calf after cleaning up today "highlighter yellow drainage" did not look too bad. Unfortunately he has had breakdown on the right anterior leg [previously this leg had not been open and he is using a black stocking] he went to see infectious disease and is been put on clindamycin 150 mg daily, I did not verify the dose although I'm not familiar with using clindamycin in this dosing range, perhaps for prophylaxisoo 06/27/17; I brought this patient back today to follow-up on the wound deterioration on the right lower leg together with surrounding cellulitis. I started him on doxycycline 4 days ago. This area looks better however he comes in today with intense cellulitis on the medial part of his left thigh. This is not have a wound in this area. Extremely tender. We've been using silver alginate to the wounds on the right lower leg left lower leg with bilateral 4 layer compression he is using his external compression pumps once a day 07/04/17; patient's left medial thigh cellulitis looks better. He has not been using his compression pumps as his insert said it was contraindicated with cellulitis. His right leg continues to make improvements all the wounds are still open. We only have one remaining wound on the left posterior calf. Using silver alginate to all open  areas. He is on doxycycline which I started a week ago and should be finishing I gave him Augmentin after Thursday's visit for the severe cellulitis on the left medial thigh which fortunately looks better 07/14/17; the patient's left medial thigh cellulitis has resolved. The cellulitis in his right lower calf on the right also looks better. All of his wounds are stable to improved we've been using silver alginate he has completed the antibiotics I have given him. He has clindamycin 150 mg once a day prescribed by infectious disease for prophylaxis, I've advised him to start this now. We have been using bilateral Unna boots over silver alginate to the wound areas 07/21/17; the patient is been to see infectious disease who noted his recurrent problems with cellulitis. He was not able to tolerate prophylactic clindamycin therefore he is on amoxicillin 500 twice a day. He also had a second daily dose of Lasix added By Dr. Oneta Rack but he is not taking this. Nor is he being completely compliant with his compression pumps a especially not this week. He has 2 remaining wounds one on the right posterior lateral lower leg and one on the left posterior medial lower leg. 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 The patient has Unna boots bilaterally. 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 He has bilateral Unna boots for compression and silver alginate to his wounds. Arrives today with his legs looking as good as I have seen him in quite some time. Not surprisingly his wounds look better as well with improvement on the right lateral leg venous insufficiency wound and also the left medial leg. He  is still using the compression pumps once a day 08/11/17; both legs appear to be doing better  wounds on the right lateral and left medial legs look better. Skin on the right leg quite good. He is been using silver alginate as the primary dressing. I'm going to use Anasept gel calcium alginate and maintain all the secondary dressings 08/18/17; the patient continues to actually do quite well. The area on his right lateral leg is just about closed the left medial also looks better although it is still moist in this area. His edema is well controlled we have been using Anasept gel with calcium alginate and the usual secondary dressings, 4 layer compression and once daily use of his compression pumps "always been able to manage 09/01/17; the patient continues to do reasonably well in spite of his trip to [place]. The area on the right lateral leg is epithelialized. Left is much better but still open. He has more edema and more chronic erythema on the left leg [venous inflammation] 09/08/17; he arrives today with no open wound on the right lateral leg and decently controlled edema. Unfortunately his left leg is not nearly as in his good situation as last week.he apparently had increasing edema starting on Saturday. He edema soaked through into his foot so used a plastic bag to walk around his home. The area on the medial right leg which was his open area is about the same however he has lost surface epithelium on the left lateral which is new and he has significant pain in the Achilles area of the left foot. He is already on amoxicillin chronically for prophylaxis of cellulitis in the left leg 09/15/17; he is completed a week of doxycycline and the cellulitis in the left posterior leg and Achilles area is as usual improved. He still has a lot of edema and fluid soaking through his dressings. There is no open wound on the right leg. He saw infectious disease NP today 09/22/17;As usual 1 we transition him from our compression wraps to his stockings things did not go well. He has several small open  areas on the right leg. He states this was caused by the compression wrap on his skin although he did not wear this with the stockings over them. He has several superficial areas on the left leg medially laterally posteriorly. He does not have any evidence of active cellulitis especially involving the left Achilles The patient is traveling from [facility] Saturday going to [facility]. He states he isn't attempting to get an appointment with a heel objects wound center there to change his dressings. I am not completely certain whether this will work 10/06/17; the patient came in on Friday for a nurse visit and the nurse reported that his legs actually look quite good. He arrives in clinic today for his regular follow-up visit. He has a new wound on his left third toe over the PIP probably caused by friction with his footwear. He has small areas on the left leg and a very superficial but epithelialized area on the right anterior lateral lower leg. Other than that his legs look as good as I've seen him in quite some time. We have been using silver alginate Review of systems; no chest pain no shortness of breath other than this a 10 point review of systems negative 10/20/17; seen by Dr. Meyer Russel last week. He had taken some antibiotics [doxycycline] that he had left over. Dr. Meyer Russel thought he had candida infection and declined to  give him further antibiotics. He has a small wound remaining on the right lateral leg several areas on the left leg including a larger area on the left posterior several left medial and anterior and a small wound on the left lateral. The area on the left dorsal third toe looks a lot better. ROS; Gen.; no fever, respiratory no cough no sputum Cardiac no chest pain other than this 10 point review of system is negative 10/30/17; patient arrives today having fallen in the bathtub 3 days ago. It took him a while to get up. He has pain and maceration in the wounds on his left  leg which have deteriorated. He has not been using his pumps he also has some maceration on the right lateral leg. 11/03/17; patient continues to have weeping edema especially in the left leg. This saturates his dressings which were just put on on 12/27. As usual the doxycycline seems to take care of the cellulitis on his lower leg. He is not complaining of fever, chills, or other systemic symptoms. He states his leg feels a lot better on the doxycycline I gave him empirically. He also apparently gets injections at his primary doctor's officeo Rocephin for cellulitis prophylaxis. I didn't ask him about his compression pump compliance today I think that's probably marginal. Arrives in the clinic with all of his dressings primary and secondary macerated full of fluid and he has bilateral edema 11/10/17; the patient's right leg looks some better although there is still a cluster of wounds on the right lateral. The left leg is inflamed with almost circumferential skin loss medially to laterally although we are still maintaining anteriorly. He does not have overt cellulitis there is a lot of drainage. He is not using compression pumps. We have been using silver alginate to the wound areas, there are not a lot of options here 11/17/17; the patient's right leg continues to be stable although there is still open wounds, better than last week. The inflammation in the left leg is better. Still loss of surface layer epithelium especially posteriorly. There is no overt cellulitis in the amount of edema and his left leg is really quite good, tells me he is using his compression pumps once a day. 11/24/17; patient's right leg has a small superficial wound laterally this continues to improve. The inflammation in the left leg is still improving however we have continuous surface layer epithelial loss posteriorly. There is no overt cellulitis in the amount of edema in both legs is really quite good. He states he is using  his compression pumps on the left leg once a day for 5 out of 7 days 12/01/17; very small superficial areas on the right lateral leg continue to improve. Edema control in both legs is better today. He has continued loss of surface epithelialization and left posterior calf although I think this is better. We have been using silver alginate with large number of absorptive secondary dressings 4 layer on the left Unna boot on the right at his request. He tells me he is using his compression pumps once a day 12/08/17; he has no open area on the right leg is edema control is good here. ooOn the left leg however he has marked erythema and tenderness breakdown of skin. He has what appears to be a wrap injury just distal to the popliteal fossa. This is the pattern of his recurrent cellulitis area and he apparently received penicillin at his primary physician's office really worked in my view but  usually response to doxycycline given it to him several times in the past 12/15/17; the patient had already deteriorated last Friday when he came in for his nurse check. There was swelling erythema and breakdown in the right leg. He has much worse skin breakdown in the left leg as well multiple open areas medially and posteriorly as well as laterally. He tells me he has been using his compression pumps but tells me he feels that the drainage out of his leg is worse when he uses a compression pumps. T be fair to him he is been saying this o for a while however I don't know that I have really been listening to this. I wonder if the compression pumps are working properly 12/22/17;. Once again he arrives with severe erythema, weeping edema from the left greater than right leg. Noncompliance with compression pumps. New this visit he is complaining of pain on the lateral aspect of the right leg and the medial aspect of his right thigh. He apparently saw his cardiologist Dr. [name] who was ordered an echocardiogram area and I  think this is a step in the right direction 12/25/17; started his doxycycline Monday night. There is still intense erythema of the right leg especially in the anterior thigh although there is less tenderness. The erythema around the wound on the right lateral calf also is less tender. He still complaining of pain in the left heel. His wounds are about the same right lateral left medial left lateral. Superficial but certainly not close to closure. He denies being systemically unwell no fever chills no abdominal pain no diarrhea 12/29/17; back in follow-up of his extensive right calf and right thigh cellulitis. I added amoxicillin to cover possible doxycycline resistant strep. This seems to of done the trick he is in much less pain there is much less erythema and swelling. He has his echocardiogram at 11:00 this morning. X-ray of the left heel was also negative. 01/05/18; the patient arrived with his edema under much better control. Now that he is retired he is able to use his compression pumps daily and sometimes twice a day per the patient. He has a wound on the right leg the lateral wound looks better. Area on the left leg also looks a lot better. He has no evidence of cellulitis in his bilateral thighs I had a quick peak at his echocardiogram. He is in normal ejection fraction and normal left ventricular function. He has moderate pulmonary hypertension moderately reduced right ventricular function. One would have to wonder about chronic sleep apnea although he says he doesn't snore. He'll review the echocardiogram with his cardiologist. 01/12/18; the patient arrives with the edema in both legs under exemplary control. He is using his compression pumps daily and sometimes twice daily. His wound on the right lateral leg is just about closed. He still has some weeping areas on the posterior left calf and lateral left calf although everything is just about closed here as well. I have spoken with Aldean Baker who is the patient's nurse practitioner and infectious disease. She was concerned that the patient had not understood that the parenteral penicillin injections he was receiving for cellulitis prophylaxis was actually benefiting him. I don't think the patient actually saw that I would tend to agree we were certainly dealing with less infections although he had a serious one last month. 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 He is healed. He'll be placed in juxtalite compression wraps  and increase his lymphedema pumps to twice daily. We will follow up again next week to ensure there are no issues with the new regiment. 01/20/18-he is here for evaluation of bilateral lower extremity weeping edema. Yesterday he was placed in compression wrap to the right lower extremity and compression stocking to left lower shrubbery. He states he uses lymphedema pumps last night and again this morning and noted a blister to the left lower extremity. On exam he was noted to have drainage to the right lower extremity. He will be placed in Unna boots bilaterally and follow-up next week 01/26/18; patient was actually discharged a week ago to his own juxta light stockings only to return the next day with bilateral lower extremity weeping edema.he was placed in bilateral Unna boots. He arrives today with pain in the back of his left leg. There is no open area on the right leg however there is a linear/wrap injury on the left leg and weeping edema on the left leg posteriorly. I spoke with infectious disease about 10 days ago. They were disappointed that the patient elected to discontinue prophylactic intramuscular penicillin shots as they felt it was particularly beneficial in reducing the frequency of his cellulitis. I discussed this with the patient today. He does not share this view. He'll definitely need antibiotics today. Finally he is traveling to [place] and trauma leaving  this Saturday and returning a week later and he does not travel with his pumps. He is going by car 01/30/18; patient was seen 4 days ago and brought back in today for review of cellulitis in the left leg posteriorly. I put him on amoxicillin this really hasn't helped as much as I might like. He is also worried because he is traveling to [facility] trauma by car. Finally we will be rewrapping him. There is no open area on the right leg over his left leg has multiple weeping areas as usual 02/09/18; The same wrap on for 10 days. He did not pick up the last doxycycline I prescribed for him. He apparently took 4 days worth he already had. There is nothing open on his right leg and the edema control is really quite good. He's had damage in the left leg medially and laterally especially probably related to the prolonged use of Unna boots 02/12/18; the patient arrived in clinic today for a nurse visit/wrap change. He complained of a lot of pain in the left posterior calf. He is taking doxycycline that I previously prescribed for him. Unfortunately even though he used his stockings and apparently used to compression pumps twice a day he has weeping edema coming out of the lateral part of his right leg. This is coming from the lower anterior lateral skin area. 02/16/18; the patient has finished his doxycycline and will finish the amoxicillin 2 days. The area of cellulitis in the left calf posteriorly has resolved. He is no longer having any pain. He tells me he is using his compression pumps at least once a day sometimes twice. 02/23/18; the patient finished his doxycycline and Amoxil last week. On Friday he noticed a small erythematous circle about the size of a quarter on the left lower leg just above his ankle. This rapidly expanded and he now has erythema on the lateral and posterior part of the thigh. This is bright red. Also has an area on the dorsal foot just above his toes and a tender area just below the  left popliteal fossa. He came off his prophylactic penicillin  injections at his own insistence one or 2 months ago. This is obviously deteriorated since then 03/02/18; patient is on doxycycline and Amoxil. Culture I did last week of the weeping area on the back of his left calf grew group B strep. I have therefore renewed the amoxicillin 500 3 times a day for a further week. He has not been systemically unwell. Still complaining of an area of discomfort right under his left popliteal fossa. There is no open wound on the right leg. He tells me that he is using his pumps twice a day on most days 03/09/18; patient arrives in clinic today completing his amoxicillin today. The cellulitis on his left leg is better. Furthermore he tells me that he had intramuscular penicillin shots that his primary care office today. However he also states that the wrap on his right leg fell down shortly after leaving clinic last week. He developed a large blister that was present when he came in for a nurse visit later in the week and then he developed intense discomfort around this area.He tells me he is using his compression pumps 03/16/18; the patient has completed his doxycycline. The infectious part of this/cellulitis in the left heel area left popliteal area is a lot better. He has 2 open areas on the right calf. Still areas on the left calf but this is a lot better as well. 03/24/18; the patient arrives complaining of pain in the left popliteal area again. He thinks some of this is wrap injury. He has no open area on the right leg and really no open area on the left calf either except for the popliteal area. He claims to be compliant with the compression pumps 03/31/18; I gave him doxycycline last week because of cellulitis in the left popliteal area. This is a lot better although the surface epithelium is denuded off and response to this. He arrives today with uncontrolled edema in the right calf area as well as a  fingernail injury in the right lateral calf. There is only a few open areas on the left 04/06/18; I gave him amoxicillin doxycycline over the last 2 weeks that the amoxicillin should be completing currently. He is not complaining of any pain or systemic symptoms. The only open areas see has is on the right lateral lower leg paradoxically I cannot see anything on the left lower leg. He tells me he is using his compression pumps twice a day on most days. Silver alginate to the wounds that are open under 4 layer compression 04/13/18; he completed antibiotics and has no new complaints. Using his compression pumps. Silver alginate that anything that's opened 04/20/18; he is using his compression pumps religiously. Silver alginate 4 layer compression anything that's opened. He comes in today with no open wounds on the left leg but 3 on the right including a new one posteriorly. He has 2 on the right lateral and one on the right posterior. He likes Unna boots on the right leg for reasons that aren't really clear we had the usual 4 layer compression on the left. It may be necessary to move to the 4 layer compression on the right however for now I left them in the Unna boots 04/27/18; he is using his compression pumps at least once a day. He has still the wounds on the right lateral calf. The area right posteriorly has closed. He does not have an open wound on the left under 4 layer compression however on the dorsal left foot  just proximal to the toes and the left third toe 2 small open areas were identified 05/11/18; he has not uses compression pumps. The areas on the right lateral calf have coalesced into one large wound necrotic surface. On the left side he has one small wound anteriorly however the edema is now weeping out of a large part of his left leg. He says he wasn't using his pumps because of the weeping fluid. I explained to him that this is the time he needs to pump more 05/18/18; patient states he is  using his compression pumps twice a day. The area on the right lateral large wound albeit superficial. On the left side he has innumerable number of small new wounds on the left calf particularly laterally but several anteriorly and medially. All these appear to have healthy granulated base these look like the remnants of blisters however they occurred under compression. The patient arrives in clinic today with his legs somewhat better. There is certainly less edema, less multiple open areas on the left calf and the right anterior leg looks somewhat better as well superficial and a little smaller. However he relates pain and erythema over the last 3-4 days in the thigh and I looked at this today. He has not been systemically unwell no fever no chills no change in blood sugar values 05/25/18; comes in today in a better state. The severe cellulitis on his left leg seems better with the Keflex. Not as tender. He has not been systemically unwell ooHard to find an open wound on the left lower leg using his compression pumps twice a day ooThe confluent wounds on his right lateral calf somewhat better looking. These will ultimately need debridement I didn't do this today. 06/01/18; the severe cellulitis on the left anterior thigh has resolved and he is completed his Keflex. ooThere is no open wound on the left leg however there is a superficial excoriation at the base of the third toe dorsally. Skin on the bottom of his left foot is macerated looking. ooThe left the wounds on the lateral right leg actually looks some better although he did require debridement of the top half of this wound area with an open curet 06/09/18 on evaluation today patient appears to be doing poorly in regard to his right lower extremity in particular this appears to likely be infected he has very thick purulent discharge along with a bright green tent to the discharge. This makes me concerned about the possibility of pseudomonas.  He's also having increased discomfort at this point on evaluation. Fortunately there does not appear to be any evidence of infection spreading to the other location at this time. 06/16/18 on evaluation today patient appears to actually be doing fairly well. His ulcer has actually diminished in size quite significantly at this point which is good news. Nonetheless he still does have some evidence of infection he did see infectious disease this morning before coming here for his appointment. I did review the results of their evaluation and their note today. They did actually have him discontinue the Cipro and initiate treatment with linezolid at this time. He is doing this for the next seven days and they recommended a follow-up in four months with them. He is the keep a log of the need for intermittent antibiotic therapy between now and when he falls back up with infectious disease. This will help them gaze what exactly they need to do to try and help them out. 06/23/18; the patient arrives  today with no open wounds on the left leg and left third toe healed. He is been using his compression pumps twice a day. On the right lateral leg he still has a sizable wound but this is a lot better than last time I saw this. 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 Has been using silver alginate under 4 layer compression 06/30/18; the only open wound he has is on the right lateral leg and this looks healthy. No debridement is required. We have been using silver alginate. He does not have an open wound on the left leg. There is apparently some drainage from the dorsal proximal third toe on the left although I see no open wound here. 07/03/18 on evaluation today patient was actually here just for a nurse visit rapid change. However when he was here on Wednesday for his rat change due to having been healed on the left  and then developing blisters we initiated the wrap again knowing that he would be back today for [place] to reevaluate and see were at. Unfortunately he has developed some cellulitis into the proximal portion of his right lower extremity even into the region of his thigh. 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 He was placed on one as what at that point. Nonetheless he is done with that and has been tolerating it well otherwise. Doxycycline which in the past really did not seem to be effective for him. Nonetheless I think the best option may be for [place] to definitely reinitiate the antibiotics for a longer period of time. 07/07/18; since I last saw this patient a week ago he has had a difficult time. At that point he did not have an open wound on his left leg. We transitioned him into juxta light stockings. He was apparently in the clinic the next day with blisters on the left lateral and left medial lower calf. He also had weeping edema fluid. He was put back into a compression wrap. He was also in the clinic on Friday with intense erythema in his right thigh. Per the patient he was started on Bactrim however that didn't work at all in terms of relieving his pain and swelling. He has taken 3 doxycycline that he had left over from last time and that seems to of helped. He has blistering on the right thigh as well. 07/14/18; the erythema on his right thigh has gotten better with doxycycline that he is finishing. 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 Presumably this cellulitis in the thigh was not related to that although I think this is a bit concerning going forward. He still has an area on the right lateral calf the blister on the right medial calf just below the knee that was discussed above. On the left 2 small open areas left medial and left lateral. Edema control is adequate. He is  using his compression pumps twice a day 07/20/18; continued improvement in the condition of both legs especially the edema in his bilateral thighs. He tells me he is been losing weight through a combination of diet and exercise. He is using his compression pumps twice a day. So overall she made to the remaining wounds 07/27/2018; continued improvement in condition of both legs. His edema is  well controlled. The area on the right lateral leg is just about closed he had one blisters show up on the medial left upper calf. We have him in 4 layer compression. He is going on a 10-day trip to [place], [place] and [place]. He will be driving. He wants to wear Unna boots because of the lessening amount of constriction. He will not use compression pumps while he is away 08/05/18 on evaluation today patient actually appears to be doing decently well all things considered in regard to his bilateral lower extremities. The worst ulcer is actually only posterior aspect of his left lower extremity with a four layer compression wrap cut into his leg a couple weeks back. He did have a trip and actually had [employer] for the trip that he is worn since he was last here. Nonetheless he feels like the [employer] actually do better for him his swelling is up a little bit but he also with his trip was not taking his Lasix on a regular set schedule like he was supposed to be. He states that obviously the reason being that he cannot drive and keep going without having to urinate too frequently which makes it difficult. He did not have his pumps with him while he was away either which I think also maybe playing a role here too. 08/13/2018; the patient only has a small open wound on the right lateral calf which is a big improvement in the last month or 2. He also has the area posteriorly just below the posterior fossa on the left which I think was a wrap injury from several weeks ago. He has no current evidence of  cellulitis. He tells me he is back into his compression pumps twice a day. He also tells me that while he was at the laundromat somebody stole a section of his extremitease stockings 08/20/2018; back in the clinic with a much improved state. He only has small areas on the right lateral mid calf which is just about healed. This was is more substantial area for quite a prolonged period of time. He has a small open area on the left anterior tibia. The area on the posterior calf just below the popliteal fossa is closed today. He is using his compression pumps twice a day 08/28/2018; patient has no open wound on the right leg. He has a smattering of open areas on the calf with some weeping lymphedema. More problematically than that it looks as though his wraps of slipped down in his usual he has very angry upper area of edema just below the right medial knee and on the right lateral calf. He has no open area on his feet. The patient is traveling to [facility] next week. I will send him in an antibiotic. We will continue to wrap the right leg. We ordered extremitease stockings for him last week and I plan to transition the right leg to a stocking when he gets home which will be in 10 days time. As usual he is very reluctant to take his pumps with him when he travels 09/07/2018; patient returns from [facility]. He shows me a picture of his left leg in the mid part of his trip last week with intense fire engine erythema. The picture look bad enough I would have considered sending him to the hospital. Instead he went to the wound care center in [facility]. They did not prescribe him antibiotics but he did take some  doxycycline he had leftover from a previous visit. I had given him trimethoprim sulfamethoxazole before he left this did not work according to the patient. This is resulted in some improvement fortunately. He comes back with a large wound on the left posterior calf. Smaller area on the  left anterior tibia. Denuded blisters on the dorsal left foot over his toes. Does not have much in the way of wounds on the right leg although he does have a very tender area on the right posterior area just below the popliteal fossa also suggestive of infection. He promises me he is back on his pumps twice a day 09/15/2018; the intense cellulitis in his left lower calf is a lot better. The wound area on the posterior left calf is also so better. However he has reasonably extensive wounds on the dorsal aspect of his second and third toes and the proximal foot just at the base of the toes. There is nothing open on the right leg 09/22/2018; the patient has excellent edema control in his legs bilaterally. He is using his external compression pumps twice a day. He has no open area on the right leg and only the areas in the left foot dorsally second and third toe area on the left side. He does not have any signs of active cellulitis. 10/06/2018; the patient has good edema control bilaterally. He has no open wound on the right leg. There is a blister in the posterior aspect of his left calf that we had to deal with today. He is using his compression pumps twice a day. There is no signs of active cellulitis. We have been using silver alginate to the wound areas. He still has vulnerable areas on the base of his left first second toes dorsally He has a his extremities stockings and we are going to transition him today into the stocking on the right leg. He is cautioned that he will need to continue to use the compression pumps twice a day. If he notices uncontrolled edema in the right leg he may need to go to 3 times a day. 10/13/2018; the patient came in for a nurse check on Friday he has a large flaccid blister on the right medial calf just below the knee. We unroofed this. He has this and a new area underneath the posterior mid calf which was undoubtedly a blister as well. He also has several small areas on  the right which is the area we put his extremities stocking on. 10/19/2018; the patient went to see infectious disease this morning I am not sure if that was a routine follow-up in any case the doxycycline I had given him was discontinued and started on linezolid. He has not started this. It is easy to look at his left calf and the inflammation and think this is cellulitis however he is very tender in the tissue just below the popliteal fossa and I have no doubt that there is infection going on here. He states the problem he is having is that with the compression pumps the edema goes down and then starts walking the wrap falls down. We will see if we can adhere this. He has 1 or 2 minuscule open areas on the right still areas that are weeping on the posterior left calf, the base of his left second and third toes 10/26/18; back today in clinic with quite of skin breakdown in his left anterior leg. This may have been infection the area below the popliteal fossa seems  a lot better however tremendous epithelial loss on the left anterior mid tibia area over quite inexpensive tissue. He has 2 blisters on the right side but no other open wound here. 10/29/2018; came in urgently to see [place] today and we worked him in for review. He states that the 4 layer compression on the right leg caused pain he had to cut it down to roughly his mid calf this caused swelling above the wrap and he has blisters and skin breakdown today. As a result of the pain he has not been using his pumps. Both legs are a lot more edematous and there is a lot of weeping fluid. 11/02/18; arrives in clinic with continued difficulties in the right leg> left. Leg is swollen and painful. multiple skin blisters and new open areas especially laterally. He has not been using his pumps on the right leg. 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 He is not systemically unwell. 11/09/2018; the patient  claims he is being compliant with his pumps. He is finished the doxycycline I gave him last week. Culture I did of the wound on the right lateral leg showed a few very resistant methicillin staph aureus. This was resistant to doxycycline. Nevertheless he states the pain in the leg is a lot better which makes me wonder if the cultured organism was not really what was causing the problem nevertheless this is a very dangerous organism to be culturing out of any wound. His right leg is still a lot larger than the left. He is using an Radio broadcast assistant on this area, he blames a 4-layer compression for causing the original skin breakdown which I doubt is true however I cannot talk him out of it. We have been using silver alginate to all of these areas which were initially blisters 11/16/2018; patient is being compliant with his external compression pumps at twice a day. Miraculously he arrives in clinic today with absolutely no open wounds. He has better edema control on the left where he has been using 4 layer compression versus wound of wounds on the right and I pointed this out to him. There is no inflammation in the skin in his lower legs which is also somewhat unusual for him. There is no open wounds on the dorsal left foot. He has extremitease stockings at home and I have asked him to bring these in next week. 11/25/18 patient's lower extremity on examination today on the left appears for the most part to be wound free. He does have an open wound on the lateral aspect of the right lower extremity but this is minimal compared to what I've seen in past. He does request that we go ahead and wrap the left leg as well even though there's nothing open just so hopefully it will not reopen in short order. 1/28; patient has superficial open wounds on the right lateral calf left anterior calf and left posterior calf. His edema control is adequate. He has an area of very tender erythematous skin at the superior upper part  of his calf compatible with his recurrent cellulitis. We have been using silver alginate as the primary dressing. He claims compliance with his compression pumps 2/4; patient has superficial open wounds on numerous areas of his left calf and again one on the left dorsal foot. The areas on the right lateral calf have healed. The cellulitis that I gave him doxycycline for last week is also resolved this was mostly on  the left anterior calf just below the tibial tuberosity. His edema looks fairly well-controlled. He tells me he went to see his primary doctor today and had blood work ordered 2/11; once again he has several open areas on the left calf left tibial area. Most of these are small and appear to have healthy granulation. He does not have anything open on the right. The edema and control in his thighs is pretty good which is usually a good indication he has been using his pumps as requested. 2/18; he continues to have several small areas on the left calf and left tibial area. Most of these are small healthy granulation. We put him in his stocking on the right leg last week and he arrives with a superficial open area over the right upper tibia and a fairly large area on the right lateral tibia in similar condition. His edema control actually does not look too bad, he claims to be using his compression pumps twice a day 2/25. Continued small areas on the left calf and left tibial area. New areas especially on the right are identified just below the tibial tuberosity and on the right upper tibia itself. There are also areas of weeping edema fluid even without an obvious wound. He does not have a considerable degree of lymphedema but clearly there is more edema here than his skin can handle. He states he is using the pumps twice a day. We have an Unna boot on the right and 4 layer compression on the left. 3/3; he continues to have an area on the right lateral calf and right posterior calf just below  the popliteal fossa. There is a fair amount of tenderness around the wound on the popliteal fossa but I did not see any evidence of cellulitis, could just be that the wrap came down and rubbed in this area. ooHe does not have an open area on the left leg however there is an area on the left dorsal foot at the base of the third toe ooWe have been using silver alginate to all wound areas 3/10; he did not have an open area on his left leg last time he was here a week ago. T oday he arrives with a horizontal wound just below the tibial tuberosity and an area on the left lateral calf. He has intense erythema and tenderness in this area. The area is on the right lateral calf and right posterior calf better than last week. We have been using silver alginate as usual 3/18 - Patient returns with 3 small open areas on left calf, and 1 small open area on right calf, the skin looks ok with no significant erythema, he continues the UNA boot on right and 4 layer compression on left. The right lateral calf wound is closed , the right posterior is small area. we will continue silver alginate to the areas. 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 He is been tolerating the dressing changes without complication. Fortunately she has made excellent improvement in regard to the overall status of his wounds. Unfortunately every time we cease wrapping him he ends up reopening in causing more significant issues at that point. Again I'm unsure of the best direction to take although I think the lymphedema clinic may be appropriate for him. 02/03/19 on evaluation today  patient appears to be doing well in regard to the wounds that we saw him for last week unfortunately he has a new area on the proximal portion of his right  medial/posterior lower extremity where the wrap somewhat slowed down and caused swelling and a blister to rub and open. Unfortunately this is the only opening that he has on either leg at this point. 02/17/19 on evaluation today patient's bilateral lower extremities appear to be doing well. He still completely healed in regard to the left lower extremity. In regard to the right lower extremity the area where the wrap and slid down and caused the blister still seems to be slightly open although this is dramatically better than during the last evaluation two weeks ago. I'm very pleased with the way this stands overall. 03/03/19 on evaluation today patient appears to be doing well in regard to his right lower extremity in general although he did have a new blister open this does not appear to be showing any evidence of active infection at this time. Fortunately there's No fevers, chills, nausea, or vomiting noted at this time. Overall I feel like he is making good progress it does feel like that the right leg will we perform the [employer] seems to do with a bit better than three layer wrap on the left which slid down on him. We may switch to doing bilateral in the book wraps. 5/4; I have not seen Mr. Kelnhofer in quite some time. According to our case manager he did not have an open wound on his left leg last week. He had 1 remaining wound on the right posterior medial calf. He arrives today with multiple openings on the left leg probably were blisters and/or wrap injuries from Unna boots. I do not think the Unna boot's will provide adequate compression on the left. I am also not clear about the frequency he is using the compression pumps. 03/17/19 on evaluation today patient appears to be doing excellent in regard to his lower extremities compared to last week's evaluation apparently. He had gotten significantly worse last week which is unfortunate. The [employer] wrap on the left did not seem to do very well  for him at all and in fact it didn't control his swelling significantly enough he had an additional outbreak. Subsequently we go back to the four layer compression wrap on the left. This is good news. At least in that he is doing better and the wound seem to be killing him. He still has not heard anything from the lymphedema clinic. 03/24/19 on evaluation today patient actually appears to be doing much better in regard to his bilateral lower [place] as compared to last week when I saw him. Fortunately there's no signs of active infection at this time. He has been tolerating the dressing changes without complication. Overall I'm extremely pleased with the progress and appearance in general. 04/07/19 on evaluation today patient appears to be doing well in regard to his bilateral lower extremities. His swelling is significantly down from where it was previous. With that being said he does have a couple blisters still open at this point but fortunately nothing that seems to be too severe and again the majority of the larger openings has healed at this time. 04/14/19 on evaluation today patient actually appears to be doing quite well in regard to his bilateral lower extremities in fact I'm not even sure there's anything significantly open at this time at any site. Nonetheless he did have  some trouble with these wraps where they are somewhat irritating him secondary to the fact that he has noted that the graph wasn't too close down to the end of this foot in a little bit short as well up to his knee. Otherwise things seem to be doing quite well. 04/21/19 upon evaluation today patient's wound bed actually showed evidence of being completely healed in regard to both lower extremities which is excellent news. There does not appear to be any signs of active infection which is also good news. I'm very pleased in this regard. No fevers, chills, nausea, or vomiting noted at this time. 04/28/19 on evaluation today  patient appears to be doing a little bit worse in regard to both lower extremities on the left mainly due to the fact that when he went infection disease the wrap was not wrapped quite high enough he developed a blister above this. On the right he is a small open area of nothing too significant but again this is continuing to give him some trouble he has been were in the Velcro compression that he has at home. 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 He's been tolerating the dressing changes without complication. Fortunately there's no signs of active infection at this time. No fevers, chills, nausea, or vomiting noted at this time. We have been trying to get an appointment with her lymphedema clinic in [facility] but unfortunately nobody can get them on phone with not been able to even fax information over the patient likewise is not been able to get in touch with them. Overall I'm not sure exactly what's going on here with to reach out again today. 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 Still having a lot of drainage unfortunately. He tells me especially in the left but even on the right. There's no signs of active infection which is good news we've been using so ratcheted up to this point. 05/19/19 on evaluation today patient actually appears to be doing quite well with regard to his left lower extremity which is great news. Fortunately in regard to the right lower extremity has an issues with his wrap and he subsequently did remove this from what I'm understanding. Nonetheless long story short is what he had rewrapped once he removed it subsequently had maggots underneath this wrap whenever he came in for evaluation today. With that being said they were obviously completely cleaned away by the nursing staff. The visit today which is  excellent news. However he does appear to potentially have some infection around the right ankle region where the maggots were located as well. He will likely require anabiotic therapy today. 05/26/19 on evaluation today patient actually appears to be doing much better in regard to his bilateral lower extremities. I feel like the infection is under much better control. With that being said there were maggots noted when the wrap was removed yet again today. Again this could have potentially been left over from previous although at this time there does not appear to be any signs of significant drainage there was obviously on the wrap some drainage as well this contracted gnats or otherwise. Either way I do not see anything that appears to be doing worse in my pinion and in fact I think his drainage has slowed down quite significantly likely mainly due to the fact to his  infection being under better control. 06/02/2019 on evaluation today patient actually appears to be doing well with regard to his bilateral lower extremities there is no signs of active infection at this time which is great news. With that being said he does have several open areas more so on the right than the left but nonetheless these are all significantly better than previously noted. 06/09/2019 on evaluation today patient actually appears to be doing well. His wrap stayed up and he did not cause any problems he had more drainage on the right compared to the left but overall I do not see any major issues at this time which is great news. 06/16/2019 on evaluation today patient appears to be doing excellent with regard to his lower extremities the only area that is open is a new blister that can have opened as of today on the medial ankle on the left. Other than this he really seems to be doing great I see no major issues at this point. 06/23/2019 on evaluation today patient appears to be doing quite well with regard to his bilateral lower  extremities. In fact he actually appears to be almost completely healed there is a small area of weeping noted of the right lower extremity just above the ankle. Nonetheless fortunately there is no signs of active infection at this time which is good news. No fevers, chills, nausea, vomiting, or diarrhea. 8/24; the patient arrived for a nurse visit today but complained of very significant pain in the left leg and therefore I was asked to look at this. Noted that he did not have an open area on the left leg last week nevertheless this was wrapped. The patient states that he is not been able to put his compression pumps on the left leg because of the discomfort. He has not been systemically unwell 06/30/2019 on evaluation today patient unfortunately despite being excellent last week is doing much worse with regard to his left lower extremity today. In fact he had to come in for a nurse on Monday where his left leg had to be rewrapped due to excessive weeping Dr. Leanord Hawking placed him on doxycycline at that point. Fortunately there is no signs of active infection Systemically at this time which is good news. 07/07/2019 in regard to the patient's wounds today he actually seems to be doing well with his right lower extremity there really is nothing open or draining at this point this is great news. Unfortunately the left lower extremity is given him additional trouble at this time. There does not appear to be any signs of active infection nonetheless he does have a lot of edema and swelling noted at this point as well as blistering all of which has led to a much more poor appearing leg at this time compared to where it was 2 weeks ago when it was almost completely healed. Obviously this is a little discouraging for the patient. He is try to contact the lymphedema clinic in [place] he has not been able to get through to them. 07/14/2019 on evaluation today patient actually appears to be doing slightly better  with regard to his left lower extremity ulcers. Overall I do feel like at least at the top of the wrap that we have been placing this area has healed quite nicely and looks much better. The remainder of the leg is showing signs of improvement. Unfortunately in the thigh area he still has an open region on the left and again on the right  he has been utilizing just a Band-Aid on an area that also opened on the thigh. Again this is an area that were not able to wrap although we did do an Ace wrap to provide some compression that something that obviously is a little less effective than the compression wraps we have been using on the lower portion of the leg. He does have an appointment with the lymphedema clinic in [facility] on Friday. 07/21/2019 on evaluation today patient appears to be doing better with regard to his lower extremity ulcers. He has been tolerating the dressing changes without complication. Fortunately there is no signs of active infection at this time. No fevers, chills, nausea, vomiting, or diarrhea. I did receive the paperwork from the physical therapist at the lymphedema clinic in [place]. Subsequently I signed off on that this morning and sent that back to him for further progression with the treatment plan. 07/28/2019 on evaluation today patient appears to be doing very well with regard to his right lower extremity where I do not see any open wounds at this point. Fortunately he is feeling great as far as that is concerned as well. In regard to the left lower extremity he has been having issues with still several areas of weeping and edema although the upper leg is doing better his lower leg still I think is going require the compression wrap at this time. No fevers, chills, nausea, vomiting, or diarrhea. 08/04/2019 on evaluation today patient unfortunately is having new wounds on the right lower extremity. Again we have been using Unna boot wrap on that side. We switched him  to using his juxta lite wrap at home. With that being said he tells me he has been using it although his legs extremely swollen and to be honest really does not appear that he has been. I cannot know that for sure however. Nonetheless he has multiple new wounds on the right lower extremity at this time. Obviously we will have to see about getting this rewrapped for him today. 08/11/2019 on evaluation today patient appears to be doing fairly well with regard to his wounds. He has been tolerating the dressing changes including the compression wraps without complication. He still has a lot of edema in his upper thigh regions bilaterally he is supposed to be seeing the lymphedema clinic on the 15th of this month once his wraps arrive for the upper part of his legs. 08/18/2019 on evaluation today patient appears to be doing well with regard to his bilateral lower extremities at this point. He has been tolerating the dressing changes without complication. Fortunately there is no signs of active infection which is also good news. He does have a couple weeping areas on the first and second toe of the right foot he also has just a small area on the left foot upper leg and a small area on the left lower leg but overall he is doing quite well in my opinion. He is supposed to be getting his wraps shortly in fact tomorrow and then subsequently is seeing the lymphedema clinic next Wednesday on the 21st. Of note he is also leaving on the 25th to go on vacation for a week to the beach. For that reason and since there is some uncertainty about what there can be doing at lymphedema clinic next Wednesday I am get a make an appointment for next Friday here for [place] to see what we need to do for him prior to him leaving for vacation. 10/23; patient  arrives in considerable pain predominantly in the upper posterior calf just distal to the popliteal fossa also in the wound anteriorly above the major wound. This is probably  cellulitis and he has had this recurrently in the past. He has no open wound on the right side and he has had an Radio broadcast assistant in that area. Finally I note that he has an area on the left posterior calf which by enlarge is mostly epithelialized. This protrudes beyond the borders of the surrounding skin in the setting of dry scaly skin and lymphedema. The patient is leaving for [facility] on Sunday. Per his longstanding pattern, he will not take his compression pumps with him predominantly out of fear that they will be stolen. He therefore asked that we put a Unna boot back on the right leg. He will also contact the wound care center in [facility] to see if they can change his dressing in the mid week. 11/3; patient returned from his vacation to [facility]. He was seen on 1 occasion at their wound care center. They did a 2 layer compression system as they did not have our 4-layer wrap. I am not completely certain what they put on the wounds. They did not change the Unna boot on the right. The patient is also seeing a lymphedema specialist physical therapist in [place]. It appears that he has some compression sleeve for his thighs which indeed look quite a bit better than I am used to seeing. He pumps over these with his external compression pumps. 11/10; the patient has a new wound on the right medial thigh otherwise there is no open areas on the right. He has an area on the left leg posteriorly anteriorly and medially and an area over the left second toe. We have been using silver alginate. He thinks the injury on his thigh is secondary to friction from the compression sleeve he has. 11/17; the patient has a new wound on the right medial thigh last week. He thinks this is because he did not have a underlying stocking for his thigh juxta lite apparatus. He now has this. The area is fairly large and somewhat angry but I do not think he has underlying cellulitis. ooHe has a intact blister on the  right anterior tibial area. ooSmall wound on the right great toe dorsally ooSmall area on the medial left calf. 11/30; the patient does not have any open areas on his right leg and we did not take his juxta lite stocking off. However he states that on Friday his compression wrap fell down lodging around his upper mid calf area. As usual this creates a lot of problems for him. He called urgently today to be seen for a nurse visit however the nurse visit turned into a provider visit because of extreme erythema and pain in the left anterior tibia extending laterally and posteriorly. The area that is problematic is extensive 10/06/2019 upon evaluation today patient actually appears to be doing poorly in regard to his left lower extremity. He Dr. Leanord Hawking did place him on doxycycline this past Monday apparently due to the fact that he was doing much worse in regard to this left leg. Fortunately the doxycycline does seem to be helping. Unfortunately we are still having a very difficult time getting his edema under any type of control in order to anticipate discharge at some point. The only way were really able to control his lymphedema really is with compression wraps and that has only even  seemingly temporary. He has been seeing a lymphedema clinic they are trying to help in this regard but still this has been somewhat frustrating in general for the patient. 10/13/19 on evaluation today patient appears to be doing excellent with regard to his right lower extremity as far as the wounds are concerned. His swelling is still quite extensive unfortunately. He is still having a lot of drainage from the thigh areas bilaterally which is unfortunate. He's been going to lymphedema clinic but again he still really does not have this edema under control as far as his lower extremities are concern. With regard to his left lower extremity this seems to be improving and I do believe the doxycycline has been of benefit for  him. He is about to complete the doxycycline. 10/20/2019 on evaluation today patient appears to be doing poorly in regard to his bilateral lower extremities. More in the right thigh he has a lot of irritation at this site unfortunately. In regard to the left lower extremity the wrap was not quite as high it appears and does seem to have caused him some trouble as well. Fortunately there is no evidence of systemic infection though he does have some blue-green drainage which has me concerned for the possibility of Pseudomonas. He tells me he is previously taking Cipro without complications and he really does not care for Levaquin however due to some of the side effects he has. He is not allergic to any medications specifically antibiotics that were aware of. 10/27/2019 on evaluation today patient actually does appear to be for the most part doing better when compared to last week's evaluation. With that being said he still has multiple open wounds over the bilateral lower extremities. He actually forgot to start taking the Cipro and states that he still has the whole bottle. He does have several new blisters on left lower extremity today I think I would recommend he go ahead and take the Cipro based on what I am seeing at this point. 12/30-Patient comes at 1 week visit, 4 layer compression wraps on the left and Unna boot on the right, primary dressing Xtrasorb and silver alginate. Patient is taking his Cipro and has a few more days left probably 5-6, and the legs are doing better. He states he is using his compressions devices which I believe he has 11/10/2019 on evaluation today patient actually appears to be much better than last time I saw him 2 weeks ago. His wounds are significantly improved and overall I am very pleased in this regard. Fortunately there is no signs of active infection at this time. He is just a couple of days away from completing Cipro. Overall his edema is much better he has been  using his lymphedema pumps which I think is also helping at this point. 11/17/2019 on evaluation today patient appears to be doing excellent in regard to his wounds in general. His legs are swollen but not nearly as much as they have been in the past. Fortunately he is tolerating the compression wraps without complication. No fevers, chills, nausea, vomiting, or diarrhea. He does have some erythema however in the distal portion of his right lower extremity specifically around the forefoot and toes there is a little bit of warmth here as well. 11/24/2019 on evaluation today patient appears to be doing well with regard to his right lower extremity I really do not see any open wounds at this point. His left lower extremity does have several open areas and his  right medial thigh also is open. Other than this however overall the patient seems to be making good progress and I am very pleased at this point. 12/01/2019 on evaluation today patient appears to be doing poorly at this point in regard to his left lower extremity has several new blisters despite the fact that we have him in compression wraps. In fact he had a 4-layer compression wrap, his upper thigh wrapped from lymphedema clinic, and a juxta light over top of the 4 layer compression wrap the lymphedema clinic applied and despite all this he still develop blisters underneath. Obviously this does have me concerned about the fact that unfortunately despite what we are doing to try to get wounds healed he continues to have new areas arise I do not think he is ever good to be at the point where he can realistically just use wraps at home to keep things under control. Typically when we heal him it takes about 1-2 days before he is back in the clinic with severe breakdown and blistering of his lower extremities bilaterally. This is happened numerous times in the past. Unfortunately I think that we may need some help as far as overall fluid overload to kind  of limit what we are seeing and get things under better control. 12/08/2019 on evaluation today patient presents for follow-up concerning his ongoing bilateral lower extremity edema. Unfortunately he is still having quite a bit of swelling the compression wraps are controlling this to some degree but he did see Dr. [name] his cardiologist I do have that available for review today as far as the appointment was concerned that was on 12/06/2019. Obviously that she has been 2 days ago. The patient states that he is only been taking the Lasix 80 mg 1 time a day he had told me previously he was taking this twice a day. Nonetheless Dr. [name] recommended this be up to 80 mg 2 times a day for the patient as he did appear to be fluid overloaded. With that being said the patient states he did this yesterday and he was unable to go anywhere or do anything due to the fact that he was constantly having to urinate. Nonetheless I think that this is still good to be something that is important for him as far as trying to get his edema under control at all things that he is going to be able to just expect his wounds to get under control and things to be better without going through at least a period of time where he is trying to stabilize his fluid management in general and I think increasing the Lasix is likely the first step here. It was also mentioned the possibility that the patient may require metolazone. With that being said he wanted to have the patient take Lasix twice a day first and then reevaluating 2 months to see where things stand. 12/15/2019 upon evaluation today patient appears to be doing regard to his legs although his toes are showing some signs of weeping especially on the left at this point to some degree on the right. There does not appear to be any signs of active infection and overall I do feel like the compression wraps are doing well for him but he has not been able to take the Lasix at home and the  increased dose that Dr. [name] recommended. He tells me that just not go to be feasible for him. Nonetheless I think in this case he should probably send  a message to Dr. [name] in order to discuss options from the standpoint of possible admission to get the fluid off or otherwise going forward. 12/22/2019 upon evaluation today patient appears to be doing fairly well with regard to his lower extremities at this point. In fact he would be doing excellent if it was not for the fact that his right anterior thigh apparently had an allergic reaction to adhesive tape that he used. The wound itself that we have been monitoring actually appears to be healed. There is a lot of irritation at this point. 12/29/2019 upon evaluation today patient appears to be doing well in regard to his lower extremities. His left medial thigh is open and somewhat draining today but this is the only region that is open the right has done much better with the treatment utilizing the steroid cream that I prescribed for him last week. Overall I am pleased in that regard. Fortunately there is no signs of active infection at this time. No fevers, chills, nausea, vomiting, or diarrhea. 01/05/2020 upon evaluation today patient appears to be doing more poorly in regard to his right lower extremity at this point upon evaluation today. Unfortunately he continues to have issues in this regard and I think the biggest issue is controlling his edema. This obviously is not very well controlled at this point is been recommended that he use the Lasix twice a day but he has not been able to do that. Unfortunately I think this is leading to an issue where honestly he is not really able to effectively control his edema and therefore the wounds really are not doing significantly better. I do not think that he is going to be able to keep things under good control unless he is able to control his edema much better. I discussed this again in great detail with  him today. 01/12/2020 good news is patient actually appears to be doing quite well today at this point. He does have an appointment with lymphedema clinic tomorrow. His legs appear healed and the toe on the left is almost completely healed. In general I am very pleased with how things stand at this point. 01/19/2020 upon evaluation today patient appears to actually be doing well in regard to his lower extremities there is nothing open at this point. Fortunately he has done extremely well more recently. Has been seeing lymphedema clinic as well. With that being said he has Velcro wraps for his lower legs as well as his upper legs. The only wound really is on his toe which is the right great toe and this is barely anything even there. With all that being said I think it is good to be appropriate today to go ahead and switch him over to the Velcro compression wraps. 01/26/2020 upon evaluation today patient appears to be doing worse with regard to his lower extremities after last week switch him to Velcro compression wraps. Unfortunately he lasted less than 24 hours he did not have the sock portion of his Velcro wrap on the left leg and subsequently developed a blister underneath the Velcro portion. Obviously this is not good and not what we were looking for at this point. He states the lymphedema clinic did tell him to wear the wrap for 23 hours and take him off for 1 I am okay with that plan but again right now we got a get things back under control again he may have some cellulitis noted as well. 02/02/2020 upon evaluation today patient unfortunately  appears to have several areas of blistering on his bilateral lower extremities today mainly on the feet. His legs do seem to be doing somewhat better which is good news. Fortunately there is no evidence of active infection at this time. No fevers, chills, nausea, vomiting, or diarrhea. 02/16/2020 upon evaluation today patient appears to be doing well at this  time with regard to his legs. He has a couple weeping areas on his toes but for the most part everything is doing better and does appear to be sealed up on his legs which is excellent news. We can continue with wrapping him at this point as he had every time we discontinue the wraps he just breaks out with new wounds. There is really no point in is going forward with this at this point. 03/08/2020 upon evaluation today patient actually appears to be doing quite well with regard to his lower extremity ulcers. He has just a very superficial and really almost nonexistent blister on the left lower extremity he has in general done very well with the compression wraps. With that being said I do not see any signs of infection at this time which is good news. 03/29/2020 upon evaluation today patient appears to be doing well with regard to his wounds currently except for where he had several new areas that opened up due to some of the wrap slipping and causing him trouble. He states he did not realize they had slipped. Nonetheless he has a 1 area on the right and 3 new areas on the left. Fortunately there is no signs of active infection at this time which is great news. 04/05/2020 upon evaluation today patient actually appears to be doing quite well in general in regard to his legs currently. Fortunately there is no signs of active infection at this time. No fevers, chills, nausea, vomiting, or diarrhea. He tells me next week that he will actually be seen in the lymphedema clinic on Thursday at 10 AM I see him on Wednesday next week. 04/12/2020 upon evaluation today patient appears to be doing very well with regard to his lower extremities bilaterally. In fact he does not appear to have any open wounds at this point which is good news. Fortunately there is no signs of active infection at this time. No fevers, chills, nausea, vomiting, or diarrhea. 04/19/2020 upon evaluation today patient appears to be doing well with  regard to his wounds currently on the bilateral lower extremities. There does not appear to be any signs of active infection at this time. Fortunately there is no evidence of systemic infection and overall very pleased at this point. Nonetheless after I held him out last week he literally had blisters the next morning already which swelled up with him being right back here in the clinic. Overall I think that he is just not can be able to be discharged with his legs the way they are he is much to volume overloaded as far as fluid is concerned and that was discussed with him today of also discussed this but should try the clinic nurse manager as well as Dr. Leanord Hawking. 04/26/2020 upon evaluation today patient appears to be doing better with regard to his wounds currently. He is making some progress and overall swelling is under good control with the compression wraps. Fortunately there is no evidence of active infection at this time. 05/10/2020 on evaluation today patient appears to be doing overall well in regard to his lower extremities bilaterally. He is Tolerating the  compression wraps without complication and with what we are seeing currently I feel like that he is making excellent progress. There is no signs of active infection at this time. 05/24/2020 upon evaluation today patient appears to be doing well in regard to his legs. The swelling is actually quite a bit down compared to where it has been in the past. Fortunately there is no sign of active infection at this time which is also good news. With that being said he does have several wounds on his toes that have opened up at this point. 05/31/2020 upon evaluation today patient appears to be doing well with regard to his legs bilaterally where he really has no significant fluid buildup at this point overall he seems to be doing quite well. Very pleased in this regard. With regard to his toes these also seem to be drying up which is excellent. We  have continue to wrap him as every time we tried as a transition to the juxta light wraps things just do not seem to get any better. 06/07/2020 upon evaluation today patient appears to be doing well with regard to his right leg at this point. Unfortunately left leg has a lot of blistering he tells me the wrap started to slide down on him when he tried to put his other Velcro wrap over top of it to help keep things in order but nonetheless still had some issues. 06/14/2020 on evaluation today patient appears to be doing well with regard to his lower extremity ulcers and foot ulcers at this point. I feel like everything is actually showing signs of improvement which is great news overall there is no signs of active infection at this time. No fevers, chills, nausea, vomiting, or diarrhea. 06/21/2020 on evaluation today patient actually appears to be doing okay in regard to his wounds in general. With that being said the biggest issue I see is on his right foot in particular the first and second toe seem to be doing a little worse due to the fact this is staying very wet. I think he is probably getting need to change out his dressings a couple times in between each week when we see him in regard to his toes in order to keep this drier based on the location and how this is proceeding. 06/28/2020 on evaluation today patient appears to be doing a little bit more poorly overall in regard to the appearance of the skin I am actually somewhat concerned about the possibility of him having a little bit of an infection here. We discussed the course of potentially giving him a doxycycline prescription which he is taken previously with good result. With that being said I do believe that this is potentially mild and at this point easily fixed. I just do not want anything to get any worse. 07/12/2020 upon evaluation today patient actually appears to be making some progress with regard to his legs which is great news there  does not appear to be any evidence of active infection. Overall very pleased with where things stand. 07/26/2020 upon evaluation today patient appears to be doing well with regard to his leg ulcers and toe ulcers at this point. He has been tolerating the compression wraps without complication overall very pleased in this regard. 08/09/2020 upon evaluation today patient appears to be doing well with regard to his lower extremities bilaterally. Fortunately there is no signs of active infection overall I am pleased with where things stand. 08/23/2020 on evaluation today patient  appears to be doing well with regard to his wound. He has been tolerating the dressing changes without complication. Fortunately there is no signs of active infection at this time. Overall his legs seem to be doing quite well which is great news and I am very pleased in that regard. No fevers, chills, nausea, vomiting, or diarrhea. 09/13/2020 upon evaluation today patient appears to be doing okay in regard to his lower extremities. He does have a fairly large blister on the right leg which I did remove the blister tissue from today so we can get this to dry out other than that however he seems to be doing quite well. There is no signs of active infection at this time. 09/27/2020 upon evaluation today patient appears to actually be doing some better in regard to his right leg. Fortunately signs of active infection at this time which is great news. No fevers, chills, nausea, vomiting, or diarrhea. 10/04/2020 upon evaluation today patient actually appears to be showing signs of improvement which is great news with regard to his leg ulcers. Fortunately there is no signs of active infection which is great news he is still taking the antibiotics currently. No fevers, chills, nausea, vomiting, or diarrhea. 10/18/2020 on evaluation today patient appears to be doing well with regard to his legs currently. He has been tolerating the dressing  changes including the wraps without complication. Fortunately there is no signs of active infection at this time. No fevers, chills, nausea, vomiting, or diarrhea. 10/25/2020 upon evaluation today patient appears to be doing decently well in regard to his wounds currently. He has been tolerating the dressing changes without complication. Overall I feel like he is making good progress albeit slow. Again this is something we can have to continue to wrap for some time to come most likely. 11/08/2020 upon evaluation today patient appears to be doing well with regard to his wounds currently. He has been tolerating the dressing changes without complication is not currently on any antibiotics and he does not appear to show any signs of infection. He does continue to have a lot of drainage on the right leg not too severe but nonetheless this is very scattered. On the left leg this is looking to be much improved overall. 11/15/2020 upon evaluation today patient appears to be doing better with regard to his legs bilaterally. Especially the right leg which was much more significant last week. There does not appear to be any signs of active infection which is great news. No fevers, chills, nausea, vomiting, or diarrhea. 11/23/2019 upon evaluation today patient appears to be doing poorly still in regard to his lower extremities bilaterally. Unfortunately his right leg in particular appears to be doing much more poorly there is no signs really of infection this is not warm to touch but he does have a lot of drainage and weeping unfortunately. With that reason I do believe that we may need to initiate some treatment here to try to help calm down some of the swelling of the right leg. I think switching to a 4-layer compression wrap would be beneficial here. The patient is in agreement with giving this a try. 11/29/2020 upon evaluation today patient appears to be doing well currently in regard to his leg ulcers. I feel like  the right leg is doing better he still has a lot of drainage but we do see some improvement here. The 4-layer compression wrap I think was helpful. 12/06/2020 upon evaluation today patient appears to be doing  well with regard to his legs. In fact they seem to be doing about the best I have seen up to this point. Fortunately there is no signs of active infection at this time. No fevers, chills, nausea, vomiting, or diarrhea. 12/20/2020 upon evaluation today patient appears to be doing well at this time in regard to his legs. He is not having any significant draining which is great news. Fortunately there is no signs of active infection at this time. No fevers, chills, nausea, vomiting, or diarrhea. 01/17/2021 upon evaluation today Ace actually appears to be doing excellent in regard to his legs. He has a few areas again that come and go as far as his toes are concerned but overall this is doing quite well. 01/31/2021 upon evaluation today patient appears to be doing well with regard to his legs. Fortunately there does not appear to be any signs of active infection which is great news. Overall he is still having significant edema despite the compression wraps basically the 4-layer compression wrap to just keep things under control there is really not much room for play. 4/13: Mr. Lehnen is a longstanding patient in our clinic and benefits greatly from weekly compression wraps. Today he has no complaints. He has been tolerating the wraps well. He states he is using the lymphedema pumps at home. 5/4; patient presents for follow-up of his chronic lymphedema/venous insufficiency ulcers. He comes weekly for compression wraps. He has no complaints today. He was unable to tolerate the Coflex 2 layer Last week so we will do the four press 4-layer compression. He has been using his lymphedema pumps daily. 5/18; patient presents for 2-week follow-up. He has no complaints or issues today. He has developed a new  wound to the right foot on his fourth toe. He overall feels well and denies signs of infection. 6/1; patient presents for 2-week follow-up. He has no complaints or issues today. He denies signs of infection. 04/18/2021 upon evaluation today patient appears to be doing well with regard to his legs bilaterally. Family open wound is actually on the toe of his left foot everything else is completely closed which is great news. In general I am extremely pleased with where things stand at this point. The patient is also happy that things are doing so well. 05/02/2021 upon evaluation today patient's legs actually appear to be doing quite well today. Fortunately there does not appear to be any signs of active infection which is great and overall I am extremely pleased with where he stands today. The patient does not appear to have any evidence of active infection at this time which is also great news. 05/09/2021 upon evaluation today patient appears to be doing a little bit more poorly in regard to his legs. Unfortunately he is having issues with some breakdown and a blood blister on the left leg this is due to I believe honestly to how it was wrapped last week. Fortunately there does not appear to be any signs of infection but nonetheless this is still a concern to be honest. No fevers, chills, nausea, vomiting, or diarrhea. 05/16/2021 upon evaluation today patient appears to be doing significantly better as compared to last week. I am very pleased with where things stand today. There does not appear to be any signs of infection which is great news and overall very pleased with where we stand. No fevers, chills, nausea, vomiting, or diarrhea. 05/30/2021 upon evaluation today patient appears to be doing well with regard to his legs.  He has been tolerating the dressing changes without complication. Fortunately there does not appear to be any signs of active infection which is great news and overall I am extremely  pleased with where things stand today. No fevers, chills, nausea, vomiting, or diarrhea. 06/20/2021 upon evaluation today patient actually appears to be making good progress today and very pleased with what we are seeing. I think his legs are really maintaining. As long as we continue wrapping he seems to be doing excellent in my opinion. Fortunately there is no signs of active infection at this time. No fevers, chills, nausea, vomiting, or diarrhea. 07/11/2021 upon evaluation today patient actually appears to be making excellent progress at this time. Fortunately there does not appear to be any evidence of active infection which is great news and overall I am extremely pleased with where things stand today. No fevers, chills, nausea, vomiting, or diarrhea. 07/25/2021 upon evaluation today patient appears to be doing well currently in regard to his lower extremities. He has been making good progress here and I do not see anything that is actually open significantly today this is great news. No fevers, chills, nausea, vomiting, or diarrhea. 08/08/2021 upon evaluation today patient appears to be doing well with regard to his wound. He has been tolerating the dressing changes without complication. With that being said unfortunately has a new area that opened up as far as his right posterior leg is concerned this was a blister he also has an area on the third toe right foot which also reopen. Fortunately there is no signs of active infection at this time which is great news. No fevers, chills, nausea, vomiting, or diarrhea. 10/17; patient came in today at his request initially for a nurse visit because it but out of concern for deterioration in both his lower legs and cellulitis I was asked to look at him. He comes in with increased swelling which he says started over the weekend he started to notice pain as well in his left medial ankle, right knee, left knee left dorsal foot. His wraps fell down contributing  to some of this but he has not been using his compression pumps over the weekend for reasons that are not really clear. He comes in with multiple new wounds including the right posterior leg, right third toe, right fourth toe, left second toe left medial ankle left dorsal ankle and right anterior lower leg Objective Constitutional Sitting or standing Blood Pressure is within target range for patient.. Pulse regular and within target range for patient.Marland Kitchen Respirations regular, non-labored and within target range.. Temperature is normal and within the target range for the patient.Marland Kitchen Appears in no distress. Vitals Time Taken: 4:18 PM, Height: 70 in, Weight: 380.2 lbs, BMI: 54.5, Temperature: 97.9 F, Pulse: 76 bpm, Respiratory Rate: 22 breaths/min, Blood Pressure: 126/69 mmHg, Capillary Blood Glucose: 162 mg/dl. Cardiovascular Poorly controlled edema to one third the way below his knees the left greater than right foot.. General Notes: Wound exam; multiple wound areas are open here as described in the HPI. Most concerning is very intense erythema on the right anterior knee left medial ankle left dorsal foot. Very tender. Integumentary (Hair, Skin) Wound #198 status is Open. Original cause of wound was Blister. The date acquired was: 08/08/2021. The wound has been in treatment 1 weeks. The wound is located on the Right,Posterior Lower Leg. The wound measures 1cm length x 0.9cm width x 0.1cm depth; 0.707cm^2 area and 0.071cm^3 volume. There is Fat Layer (Subcutaneous Tissue) exposed.  There is no tunneling or undermining noted. There is a large amount of serous drainage noted. The wound margin is distinct with the outline attached to the wound base. There is large (67-100%) red granulation within the wound bed. There is no necrotic tissue within the wound bed. Wound #199 status is Open. Original cause of wound was Gradually Appeared. The date acquired was: 08/08/2021. The wound has been in treatment 1  weeks. The wound is located on the Right T Third. The wound measures 1cm length x 1.2cm width x 0.1cm depth; 0.942cm^2 area and 0.094cm^3 volume. There is oe Fat Layer (Subcutaneous Tissue) exposed. There is no tunneling or undermining noted. There is a medium amount of serous drainage noted. The wound margin is distinct with the outline attached to the wound base. There is large (67-100%) red granulation within the wound bed. There is a small (1-33%) amount of necrotic tissue within the wound bed including Adherent Slough. Wound #200 status is Open. Original cause of wound was Gradually Appeared. The date acquired was: 08/20/2021. The wound is located on the Left,Medial Lower Leg. The wound measures 4cm length x 1.5cm width x 0.1cm depth; 4.712cm^2 area and 0.471cm^3 volume. There is Fat Layer (Subcutaneous Tissue) exposed. There is no tunneling or undermining noted. There is a large amount of serous drainage noted. The wound margin is distinct with the outline attached to the wound base. There is large (67-100%) red granulation within the wound bed. There is no necrotic tissue within the wound bed. Wound #201 status is Open. Original cause of wound was Gradually Appeared. The date acquired was: 08/20/2021. The wound is located on the Right,Anterior Lower Leg. The wound measures 1cm length x 0.8cm width x 0.1cm depth; 0.628cm^2 area and 0.063cm^3 volume. There is Fat Layer (Subcutaneous Tissue) exposed. There is no tunneling or undermining noted. There is a large amount of serous drainage noted. The wound margin is distinct with the outline attached to the wound base. There is large (67-100%) red granulation within the wound bed. There is no necrotic tissue within the wound bed. Wound #202 status is Open. Original cause of wound was Gradually Appeared. The date acquired was: 08/20/2021. The wound is located on the Right T oe Fourth. The wound measures 1.5cm length x 1cm width x 0.1cm depth; 1.178cm^2  area and 0.118cm^3 volume. There is Fat Layer (Subcutaneous Tissue) exposed. There is no tunneling or undermining noted. There is a medium amount of serous drainage noted. The wound margin is distinct with the outline attached to the wound base. There is large (67-100%) red granulation within the wound bed. There is no necrotic tissue within the wound bed. Wound #203 status is Open. Original cause of wound was Gradually Appeared. The date acquired was: 08/20/2021. The wound is located on the Right,Anterior Ankle. The wound measures 1.5cm length x 1.5cm width x 0.1cm depth; 1.767cm^2 area and 0.177cm^3 volume. There is Fat Layer (Subcutaneous Tissue) exposed. There is no tunneling or undermining noted. There is a medium amount of serosanguineous drainage noted. The wound margin is distinct with the outline attached to the wound base. There is large (67-100%) red granulation within the wound bed. There is a small (1-33%) amount of necrotic tissue within the wound bed including Eschar. 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 There was a Four Layer Compression Therapy Procedure by Antonieta Iba, RN. Post procedure Diagnosis Wound #198: Same as  Pre-Procedure Wound #200 Pre-procedure diagnosis of Wound #200 is a Lymphedema located on the Left,Medial Lower Leg . There was a Four Layer Compression Therapy Procedure by Antonieta Iba, RN. Post procedure Diagnosis Wound #200: Same as Pre-Procedure Wound #201 Pre-procedure diagnosis of Wound #201 is a Lymphedema located on the Right,Anterior Lower Leg . There was a Four Layer Compression Therapy Procedure by Antonieta Iba, RN. Post procedure Diagnosis Wound #201: Same as Pre-Procedure Wound #203 Pre-procedure diagnosis of Wound #203 is a Lymphedema located on the Right,Anterior Ankle . There was a Four Layer Compression Therapy Procedure by Antonieta Iba, RN. Post procedure Diagnosis Wound #203: Same as Pre-Procedure Plan Follow-up Appointments: Return Appointment in 1 week. Leonard Schwartz 08/29/2021 Nurse Visit: - Friday 08/24/21 Other: - Prescription for Doxycycline sent to pharmacy, take as directed. Bathing/ Shower/ Hygiene: May shower with protection but do not get wound dressing(s) wet. Edema Control - Lymphedema / SCD / Other: Lymphedema Pumps. Use Lymphedema pumps on leg(s) 2-3 times a day for 45-60 minutes. If wearing any wraps or hose, do not remove them. Continue exercising as instructed. Elevate legs to the level of the heart or above for 30 minutes daily and/or when sitting, a frequency of: - throughout the day Avoid standing for long periods of time. Exercise regularly Non Wound Condition: Other Non Wound Condition Orders/Instructions: - lotion to both legs, 4 layer compression wraps both legs, silver alginate to any weeping areas WOUND #198: - Lower Leg Wound Laterality: Right, Posterior Cleanser: Soap and Water 1 x Per Week/ Discharge Instructions: May shower and wash wound with dial antibacterial soap and water prior to dressing change. Cleanser: Wound Cleanser 1 x Per Week/ Discharge Instructions: Cleanse the wound with wound cleanser prior to applying a clean dressing  using gauze sponges, not tissue or cotton balls. Peri-Wound Care: Zinc Oxide Ointment 30g tube 1 x Per Week/ Discharge Instructions: Apply Zinc Oxide to periwound with each dressing change Peri-Wound Care: Sween Lotion (Moisturizing lotion) 1 x Per Week/ Discharge Instructions: Apply moisturizing lotion as directed Prim Dressing: KerraCel Ag Gelling Fiber Dressing, 4x5 in (silver alginate) 1 x Per Week/ ary Discharge Instructions: Apply silver alginate to wound bed as instructed Secondary Dressing: ABD Pad, 8x10 1 x Per Week/ Discharge Instructions: Apply over primary dressing as directed. Com pression Wrap: FourPress (4 layer compression wrap) 1 x Per Week/ Discharge Instructions: ****UNNA BOOT FIRST LAYER APPLIED TO UPPER PORTION OF LOWER LEG.*** WOUND #199: - T Third Wound Laterality: Right oe Cleanser: Soap and Water 1 x Per Week/ Discharge Instructions: May shower and wash wound with dial antibacterial soap and water prior to dressing change. Cleanser: Wound Cleanser 1 x Per Week/ Discharge Instructions: Cleanse the wound with wound cleanser prior to applying a clean dressing using gauze sponges, not tissue or cotton balls. Prim Dressing: KerraCel Ag Gelling Fiber Dressing, 2x2 in (silver alginate) 1 x Per Week/ ary Discharge Instructions: Apply silver alginate to wound bed as instructed  Secured With: Insurance underwriter, Sterile 2x75 (in/in) 1 x Per Week/ Discharge Instructions: Secure with stretch gauze as directed. Secured With: 62M Medipore H Soft Cloth Surgical T ape, 2x2 (in/yd) 1 x Per Week/ Discharge Instructions: Secure dressing with tape as directed. WOUND #200: - Lower Leg Wound Laterality: Left, Medial Cleanser: Soap and Water 1 x Per Week/ Discharge Instructions: May shower and wash wound with dial antibacterial soap and water prior to dressing change. Cleanser: Wound Cleanser 1 x Per Week/ Discharge Instructions: Cleanse the wound with wound cleanser prior  to applying a clean dressing using gauze sponges, not tissue or cotton balls. Peri-Wound Care: Zinc Oxide Ointment 30g tube 1 x Per Week/ Discharge Instructions: Apply Zinc Oxide to periwound with each dressing change Peri-Wound Care: Sween Lotion (Moisturizing lotion) 1 x Per Week/ Discharge Instructions: Apply moisturizing lotion as directed Prim Dressing: KerraCel Ag Gelling Fiber Dressing, 4x5 in (silver alginate) 1 x Per Week/ ary Discharge Instructions: Apply silver alginate to wound bed as instructed Secondary Dressing: ABD Pad, 8x10 1 x Per Week/ Discharge Instructions: Apply over primary dressing as directed. Com pression Wrap: FourPress (4 layer compression wrap) 1 x Per Week/ Discharge Instructions: ****UNNA BOOT FIRST LAYER APPLIED TO UPPER PORTION OF LOWER LEG.*** WOUND #201: - Lower Leg Wound Laterality: Right, Anterior Cleanser: Soap and Water 1 x Per Week/ Discharge Instructions: May shower and wash wound with dial antibacterial soap and water prior to dressing change. Cleanser: Wound Cleanser 1 x Per Week/ Discharge Instructions: Cleanse the wound with wound cleanser prior to applying a clean dressing using gauze sponges, not tissue or cotton balls. Peri-Wound Care: Zinc Oxide Ointment 30g tube 1 x Per Week/ Discharge Instructions: Apply Zinc Oxide to periwound with each dressing change Peri-Wound Care: Sween Lotion (Moisturizing lotion) 1 x Per Week/ Discharge Instructions: Apply moisturizing lotion as directed Prim Dressing: KerraCel Ag Gelling Fiber Dressing, 4x5 in (silver alginate) 1 x Per Week/ ary Discharge Instructions: Apply silver alginate to wound bed as instructed Secondary Dressing: ABD Pad, 8x10 1 x Per Week/ Discharge Instructions: Apply over primary dressing as directed. Com pression Wrap: FourPress (4 layer compression wrap) 1 x Per Week/ Discharge Instructions: ****UNNA BOOT FIRST LAYER APPLIED TO UPPER PORTION OF LOWER LEG.*** WOUND #202: - T Fourth  Wound Laterality: Right oe Cleanser: Soap and Water 1 x Per Week/ Discharge Instructions: May shower and wash wound with dial antibacterial soap and water prior to dressing change. Cleanser: Wound Cleanser 1 x Per Week/ Discharge Instructions: Cleanse the wound with wound cleanser prior to applying a clean dressing using gauze sponges, not tissue or cotton balls. Prim Dressing: KerraCel Ag Gelling Fiber Dressing, 2x2 in (silver alginate) 1 x Per Week/ ary Discharge Instructions: Apply silver alginate to wound bed as instructed Secured With: Conforming Stretch Gauze Bandage, Sterile 2x75 (in/in) 1 x Per Week/ Discharge Instructions: Secure with stretch gauze as directed. Secured With: 62M Medipore H Soft Cloth Surgical T ape, 2x2 (in/yd) 1 x Per Week/ Discharge Instructions: Secure dressing with tape as directed. WOUND #203: - Ankle Wound Laterality: Right, Anterior Cleanser: Soap and Water 1 x Per Week/ Discharge Instructions: May shower and wash wound with dial antibacterial soap and water prior to dressing change. Cleanser: Wound Cleanser 1 x Per Week/ Discharge Instructions: Cleanse the wound with wound cleanser prior to applying a clean dressing using gauze sponges, not tissue or cotton balls. Peri-Wound Care: Zinc Oxide Ointment 30g tube 1 x Per Week/ Discharge Instructions: Apply Zinc Oxide to  periwound with each dressing change Peri-Wound Care: Sween Lotion (Moisturizing lotion) 1 x Per Week/ Discharge Instructions: Apply moisturizing lotion as directed Prim Dressing: KerraCel Ag Gelling Fiber Dressing, 4x5 in (silver alginate) 1 x Per Week/ ary Discharge Instructions: Apply silver alginate to wound bed as instructed Secondary Dressing: ABD Pad, 8x10 1 x Per Week/ Discharge Instructions: Apply over primary dressing as directed. Com pression Wrap: FourPress (4 layer compression wrap) 1 x Per Week/ Discharge Instructions: ****UNNA BOOT FIRST LAYER APPLIED TO UPPER PORTION OF LOWER  LEG.*** 1. Doxycycline 100 twice daily. It looks like he has areas of erysipelas bilaterally. Some of this might be related to wrap coming down however the left medial ankle is worrisome. 2. We put him back in silver alginate to all wound areas and 4-layer compression. We will bring him back in for a nurse visit on Friday morning. 3. I told him that he will need to go to the ER if the erythema that he can see on his right knee extends into his thigh Electronic Signature(s) Signed: 08/20/2021 5:32:36 PM By: Baltazar Najjar MD Entered By: Baltazar Najjar on 08/20/2021 17:13:17 -------------------------------------------------------------------------------- SuperBill Details Patient Name: Date of Service: [name], Robinson J. 08/20/2021 Medical Record Number: [id number] Patient Account Number: [id number] Date of Birth/Sex: Treating RN: April 22, 1951 (70 y.o. 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

## 2021-08-20 NOTE — Progress Notes (Signed)
BANNER, HUCKABA ([id number]) Visit Report for 08/20/2021 Arrival Information Details Patient Name: Date of Service: [name], [name] 08/20/2021 3:45 PM Medical Record Number: [id number] Patient Account Number: [id number] Date of Birth/Sex: Treating RN: 04/28/1951 (70 y.o. [name] Primary Care Meyer Arora: [name] Other Clinician: Referring Angellica Maddison: Treating Leira Regino/Extender: Ruffin Pyo in Treatment: 5 Visit Information History Since Last Visit Added or deleted any medications: No Patient Arrived: [name] new allergies or adverse reactions: No Arrival Time: 16:16 Had a fall or experienced change in No Transfer Assistance: Manual activities of daily living that may affect Patient Identification Verified: Yes risk of falls: Secondary Verification Process Completed: Yes Signs or symptoms of abuse/neglect since No Patient Requires Transmission-Based Precautions: No last visito Patient Has Alerts: Yes Hospitalized since last visit: No Implantable device outside of the clinic No excluding cellular tissue based products placed in the center since last visit: Has Dressing in Place as Prescribed: Yes Has Compression in Place as Prescribed: Yes Has Footwear/Offloading in Place as Yes Prescribed: Left: Surgical Shoe with Pressure Relief Insole Right: Surgical Shoe with Pressure Relief Insole Pain Present Now: Yes Electronic Signature(s) Signed: 08/20/2021 5:53:01 PM By: Lorrin Jackson Entered By: Lorrin Jackson on 08/20/2021 16:17:06 -------------------------------------------------------------------------------- Compression Therapy Details Patient Name: Date of Service: [name], [name] 08/20/2021 3:45 PM Medical Record Number: [id number] Patient Account Number: [id number] Date of Birth/Sex: Treating RN: 01/05/1951 (70 y.o. [name] Primary Care Tyanna Hach: [name] Other Clinician: Referring Leyani Gargus: Treating  Hafiz Irion/Extender: Ruffin Pyo in Treatment: 291 Compression Therapy Performed for Wound Assessment: Wound #200 Left,Medial Lower Leg Performed By: Clinician Lorrin Jackson, RN Compression Type: Four Layer Post Procedure Diagnosis Same as Pre-procedure Electronic Signature(s) Signed: 08/20/2021 5:53:01 PM By: Lorrin Jackson Entered By: Lorrin Jackson on 08/20/2021 16:45:11 -------------------------------------------------------------------------------- Compression Therapy Details Patient Name: Date of Service: [name] 08/20/2021 3:45 PM Medical Record Number: [id number] Patient Account Number: [id number] Date of Birth/Sex: Treating RN: 15-Mar-1951 (70 y.o. [name] Primary Care Odas Ozer: [name] Other Clinician: Referring Trei Schoch: Treating Shirl Weir/Extender: Ruffin Pyo in Treatment: 291 Compression Therapy Performed for Wound Assessment: Wound #201 Right,Anterior Lower Leg Performed By: Clinician Lorrin Jackson, RN Compression Type: Four Layer Post Procedure Diagnosis Same as Pre-procedure Electronic Signature(s) Signed: 08/20/2021 5:53:01 PM By: Lorrin Jackson Entered By: Lorrin Jackson on 08/20/2021 16:45:11 -------------------------------------------------------------------------------- Compression Therapy Details Patient Name: Date of Service: [facility] 08/20/2021 3:45 PM Medical Record Number: [id number] Patient Account Number: [id number] Date of Birth/Sex: Treating RN: [birth year] (70 y.o. [name] Primary Care Titilayo Hagans: [name] Other Clinician: Referring Jillene Wehrenberg: Treating Kaseem Vastine/Extender: Ruffin Pyo in Treatment: 291 Compression Therapy Performed for Wound Assessment: Wound #198 Right,Posterior Lower Leg Performed By: Clinician Lorrin Jackson, RN Compression Type: Four Layer Post Procedure Diagnosis Same as  Pre-procedure Electronic Signature(s) Signed: 08/20/2021 5:53:01 PM By: Lorrin Jackson Entered By: Lorrin Jackson on 08/20/2021 16:45:12 -------------------------------------------------------------------------------- Compression Therapy Details Patient Name: Date of Service: [facility] 08/20/2021 3:45 PM Medical Record Number: [id number] Patient Account Number: [id number] Date of Birth/Sex: Treating RN: [birth year] (70 y.o. [name] Primary Care Shatori Bertucci: [name] Other Clinician: Referring Tyashia Morrisette: Treating Leena Tiede/Extender: Ruffin Pyo in Treatment: 291 Compression Therapy Performed for Wound Assessment: Wound #203 Right,Anterior Ankle Performed By: Clinician Lorrin Jackson, RN Compression Type: Four Layer Post Procedure Diagnosis Same as Pre-procedure Electronic Signature(s) Signed: 08/20/2021 5:53:01 PM By: Lorrin Jackson Entered By: Lorrin Jackson on  08/20/2021 16:45:12 -------------------------------------------------------------------------------- Encounter Discharge Information Details Patient Name: Date of Service: [name], [name] 08/20/2021 3:45 PM Medical Record Number: [id number] Patient Account Number: [id number] Date of Birth/Sex: Treating RN: 02-23-1951 (70 y.o. [name] Primary Care Lineth Thielke: [name] Other Clinician: Referring Georgiana Spillane: Treating Kaeden Mester/Extender: Ruffin Pyo in Treatment: 291 Encounter Discharge Information Items Discharge Condition: Stable Ambulatory Status: Walker Discharge Destination: Home Transportation: Private Auto Accompanied By: self Schedule Follow-up Appointment: Yes Clinical Summary of Care: Notes Patient to go to pharmacy and pick up oral medication. Electronic Signature(s) Signed: 08/20/2021 5:45:49 PM By: Deon Pilling RN, BSN Entered By: Deon Pilling on 08/20/2021  17:45:31 -------------------------------------------------------------------------------- Lower Extremity Assessment Details Patient Name: Date of Service: [name], [name] 08/20/2021 3:45 PM Medical Record Number: [id number] Patient Account Number: [id number] Date of Birth/Sex: Treating RN: [birth year] (70 y.o. [name] Primary Care Brooklynne Pereida: [name] Other Clinician: Referring Jasmin Trumbull: Treating Zacari Radick/Extender: Ruffin Pyo in Treatment: 291 Edema Assessment Assessed: [name]: Yes] [name]: Yes] Edema: [Left: Yes] [Right: Yes] Calf Left: Right: Point of Measurement: 25 cm From Medial Instep 39 cm 38.5 cm Ankle Left: Right: Point of Measurement: 9 cm From Medial Instep 26.5 cm 26.5 cm Vascular Assessment Pulses: Dorsalis Pedis Palpable: [Left:Yes] [Right:No] Electronic Signature(s) Signed: 08/20/2021 5:53:01 PM By: Lorrin Jackson Entered By: Lorrin Jackson on 08/20/2021 16:20:37 -------------------------------------------------------------------------------- Multi Wound Chart Details Patient Name: Date of Service: [name], [name] 08/20/2021 3:45 PM Medical Record Number: [id number] Patient Account Number: [id number] Date of Birth/Sex: Treating RN: [birth year] (70 y.o. [name] Primary Care Deshauna Cayson: [name] Other Clinician: Referring Rajesh Wyss: Treating Brandon Wiechman/Extender: Ruffin Pyo in Treatment: 291 Vital Signs Height(in): 70 Capillary Blood Glucose(mg/dl): 162 Weight(lbs): 380.2 Pulse(bpm): 74 Body Mass Index(BMI): 67 Blood Pressure(mmHg): 126/69 Temperature(F): 97.9 Respiratory Rate(breaths/min): 22 Photos: Right, Posterior Lower Leg Right T Third oe Left, Medial Lower Leg Wound Location: Blister Gradually Appeared Gradually Appeared Wounding Event: Lymphedema Lymphedema Lymphedema Primary Etiology: Chronic sinus problems/congestion, Chronic sinus problems/congestion,  Chronic sinus problems/congestion, Comorbid History: Arrhythmia, Hypertension, Peripheral Arrhythmia, Hypertension, Peripheral Arrhythmia, Hypertension, Peripheral Arterial Disease, Type II Diabetes, Arterial Disease, Type II Diabetes, Arterial Disease, Type II Diabetes, History of Burn, Gout, Confinement History of Burn, Gout, Confinement History of Burn, Gout, Confinement Anxiety Anxiety Anxiety 08/08/2021 08/08/2021 08/20/2021 Date Acquired: 1 1 0 Weeks of Treatment: Open Open Open Wound Status: 1x0.9x0.1 1x1.2x0.1 4x1.5x0.1 Measurements L x W x D (cm) 0.707 0.942 4.712 A (cm) : rea 0.071 0.094 0.471 Volume (cm) : 95.30% -66.70% 0.00% % Reduction in Area: 95.30% -64.90% 0.00% % Reduction in Volume: Partial Thickness Full Thickness Without Exposed Full Thickness Without Exposed Classification: Support Structures Support Structures Large Medium Large Exudate A mount: Serous Serous Serous Exudate Type: amber amber amber Exudate Color: Distinct, outline attached Distinct, outline attached Distinct, outline attached Wound Margin: Large (67-100%) Large (67-100%) Large (67-100%) Granulation Amount: Red Red Red Granulation Quality: None Present (0%) Small (1-33%) None Present (0%) Necrotic Amount: N/A Adherent Slough N/A Necrotic Tissue: Fat Layer (Subcutaneous Tissue): Yes Fat Layer (Subcutaneous Tissue): Yes Fat Layer (Subcutaneous Tissue): Yes Exposed Structures: Fascia: No Fascia: No Fascia: No Tendon: No Tendon: No Tendon: No Muscle: No Muscle: No Muscle: No Joint: No Joint: No Joint: No Bone: No Bone: No Bone: No Medium (34-66%) Medium (34-66%) None Epithelialization: Compression Therapy N/A Compression Therapy Procedures Performed: Wound Number: [id number] Photos: Right, Anterior Lower Leg Right T Fourth oe Right, Anterior Ankle Wound Location: Gradually Appeared  Gradually Appeared Gradually Appeared Wounding Event: Lymphedema Lymphedema  Lymphedema Primary Etiology: Chronic sinus problems/congestion, Chronic sinus problems/congestion, Chronic sinus problems/congestion, Comorbid History: Arrhythmia, Hypertension, Peripheral Arrhythmia, Hypertension, Peripheral Arrhythmia, Hypertension, Peripheral Arterial Disease, Type II Diabetes, Arterial Disease, Type II Diabetes, Arterial Disease, Type II Diabetes, History of Burn, Gout, Confinement History of Burn, Gout, Confinement History of Burn, Gout, Confinement Anxiety Anxiety Anxiety 08/20/2021 08/20/2021 08/20/2021 Date Acquired: 0 0 0 Weeks of Treatment: Open Open Open Wound Status: 1x0.8x0.1 1.5x1x0.1 1.5x1.5x0.1 Measurements L x W x D (cm) 0.628 1.178 1.767 A (cm) : rea 0.063 0.118 0.177 Volume (cm) : 0.00% 0.00% N/A % Reduction in Area: 0.00% 0.00% N/A % Reduction in Volume: Full Thickness Without Exposed Full Thickness Without Exposed Full Thickness Without Exposed Classification: Support Structures Support Structures Support Structures Large Medium Medium Exudate A mount: Serous Serous Serosanguineous Exudate Type: amber amber red, brown Exudate Color: Distinct, outline attached Distinct, outline attached Distinct, outline attached Wound Margin: Large (67-100%) Large (67-100%) Large (67-100%) Granulation Amount: Red Red Red Granulation Quality: None Present (0%) None Present (0%) Small (1-33%) Necrotic Amount: N/A N/A Eschar Necrotic Tissue: Fat Layer (Subcutaneous Tissue): Yes Fat Layer (Subcutaneous Tissue): Yes Fat Layer (Subcutaneous Tissue): Yes Exposed Structures: Fascia: No Fascia: No Fascia: No Tendon: No Tendon: No Tendon: No Muscle: No Muscle: No Muscle: No Joint: No Joint: No Joint: No Bone: No Bone: No Bone: No None None None Epithelialization: Compression Therapy N/A Compression Therapy Procedures Performed: Treatment Notes Electronic Signature(s) Signed: 08/20/2021 5:32:36 PM By: Linton Ham MD Signed:  08/20/2021 5:53:01 PM By: Lorrin Jackson Entered By: Linton Ham on 08/20/2021 17:05:44 -------------------------------------------------------------------------------- Multi-Disciplinary Care Plan Details Patient Name: Date of Service: [facility] [name] 08/20/2021 3:45 PM Medical Record Number: [id number] Patient Account Number: [id number] Date of Birth/Sex: Treating RN: [birth year] (70 y.o. [name] Primary Care Naresh Althaus: [name] Other Clinician: Referring Caydin Yeatts: Treating Faron Whitelock/Extender: Ruffin Pyo in Treatment: [place] reviewed with physician Active Inactive Venous Leg Ulcer Nursing Diagnoses: Actual venous Insuffiency (use after diagnosis is confirmed) Goals: Patient will maintain optimal edema control Date Initiated: 09/10/2016 Target Resolution Date: 08/31/2021 Goal Status: Active Verify adequate tissue perfusion prior to therapeutic compression application Date Initiated: 09/10/2016 Date Inactivated: 11/28/2016 Goal Status: Met Interventions: Assess peripheral edema status every visit. Compression as ordered Provide education on venous insufficiency Notes: Electronic Signature(s) Signed: 08/20/2021 5:53:01 PM By: Lorrin Jackson Entered By: Lorrin Jackson on 08/20/2021 16:34:18 -------------------------------------------------------------------------------- Pain Assessment Details Patient Name: Date of Service: [name], [name] 08/20/2021 3:45 PM Medical Record Number: [id number] Patient Account Number: [id number] Date of Birth/Sex: Treating RN: 07/29/1951 (70 y.o. [name] Primary Care Noralyn Karim: [name] Other Clinician: Referring Libi Corso: Treating Sumaya Riedesel/Extender: Ruffin Pyo in Treatment: 291 Active Problems Location of Pain Severity and Description of Pain Patient Has Paino Yes Site Locations Pain Location: Pain in Ulcers With Dressing  Change: Yes Duration of the Pain. Constant / Intermittento Intermittent Rate the pain. Current Pain Level: 6 Character of Pain Describe the Pain: Tender, Throbbing Pain Management and Medication Current Pain Management: Medication: Yes Cold Application: No Rest: Yes Massage: No Activity: No T.E.N.S.: No Heat Application: No Leg drop or elevation: No Is the Current Pain Management Adequate: Adequate Electronic Signature(s) Signed: 08/20/2021 5:53:01 PM By: Lorrin Jackson Signed: 08/20/2021 5:53:01 PM By: Lorrin Jackson Entered By: Lorrin Jackson on 08/20/2021 16:19:01 -------------------------------------------------------------------------------- Patient/Caregiver Education Details Patient Name: Date of Service: [name], [name] 10/17/2022andnbsp3:45 PM Medical Record Number:  [id number] Patient Account Number: [id number] Date of Birth/Gender: Treating RN: 03-Oct-1951 (70 y.o. [name] Primary Care Physician: [name] Other Clinician: Referring Physician: Treating Physician/Extender: Ruffin Pyo in Treatment: 56 Education Assessment Education Provided To: Patient Education Topics Provided Venous: Methods: Demonstration, Explain/Verbal, Printed Responses: State content correctly Wound/Skin Impairment: Methods: Demonstration, Explain/Verbal, Printed Responses: State content correctly Electronic Signature(s) Signed: 08/20/2021 5:53:01 PM By: Lorrin Jackson Entered By: Lorrin Jackson on 08/20/2021 16:34:46 -------------------------------------------------------------------------------- Wound Assessment Details Patient Name: Date of Service: [name], [name] 08/20/2021 3:45 PM Medical Record Number: [id number] Patient Account Number: [id number] Date of Birth/Sex: Treating RN: [birth year] (70 y.o. [name] Primary Care Candido Flott: [name] Other Clinician: Referring Carol Theys: Treating Reno Clasby/Extender: Ruffin Pyo in Treatment: 291 Wound Status Wound Number: 169 Primary Lymphedema Etiology: Wound Location: Right, Posterior Lower Leg Wound Open Wounding Event: Blister Status: Date Acquired: 08/08/2021 Comorbid Chronic sinus problems/congestion, Arrhythmia, Hypertension, Weeks Of Treatment: 1 History: Peripheral Arterial Disease, Type II Diabetes, History of Burn, Gout, Clustered Wound: No Confinement Anxiety Photos Wound Measurements Length: (cm) 1 Width: (cm) 0.9 Depth: (cm) 0.1 Area: (cm) 0.707 Volume: (cm) 0.071 % Reduction in Area: 95.3% % Reduction in Volume: 95.3% Epithelialization: Medium (34-66%) Tunneling: No Undermining: No Wound Description Classification: Partial Thickness Wound Margin: Distinct, outline attached Exudate Amount: Large Exudate Type: Serous Exudate Color: amber Foul Odor After Cleansing: No Slough/Fibrino No Wound Bed Granulation Amount: Large (67-100%) Exposed Structure Granulation Quality: Red Fascia Exposed: No Necrotic Amount: None Present (0%) Fat Layer (Subcutaneous Tissue) Exposed: Yes Tendon Exposed: No Muscle Exposed: No Joint Exposed: No Bone Exposed: No Treatment Notes Wound #198 (Lower Leg) Wound Laterality: Right, Posterior Cleanser Soap and Water Discharge Instruction: May shower and wash wound with dial antibacterial soap and water prior to dressing change. Wound Cleanser Discharge Instruction: Cleanse the wound with wound cleanser prior to applying a clean dressing using gauze sponges, not tissue or cotton balls. Peri-Wound Care Zinc Oxide Ointment 30g tube Discharge Instruction: Apply Zinc Oxide to periwound with each dressing change Sween Lotion (Moisturizing lotion) Discharge Instruction: Apply moisturizing lotion as directed Topical Primary Dressing KerraCel Ag Gelling Fiber Dressing, 4x5 in (silver alginate) Discharge Instruction: Apply silver alginate to wound bed as instructed Secondary  Dressing ABD Pad, 8x10 Discharge Instruction: Apply over primary dressing as directed. Secured With Compression Wrap FourPress (4 layer compression wrap) Discharge Instruction: ****UNNA BOOT FIRST LAYER APPLIED TO UPPER PORTION OF LOWER LEG.*** Compression Stockings Add-Ons Electronic Signature(s) Signed: 08/20/2021 5:53:01 PM By: Lorrin Jackson Signed: 08/20/2021 5:53:01 PM By: Lorrin Jackson Entered By: Lorrin Jackson on 08/20/2021 16:29:19 -------------------------------------------------------------------------------- Wound Assessment Details Patient Name: Date of Service: [name], [name] 08/20/2021 3:45 PM Medical Record Number: [id number] Patient Account Number: [id number] Date of Birth/Sex: Treating RN: 1951-02-19 (70 y.o. [name] Primary Care Norissa Bartee: [name] Other Clinician: Referring Nery Kalisz: Treating Toula Miyasaki/Extender: Ruffin Pyo in Treatment: 291 Wound Status Wound Number: 199 Primary Lymphedema Etiology: Wound Location: Right T Third oe Wound Open Wounding Event: Gradually Appeared Status: Date Acquired: 08/08/2021 Comorbid Chronic sinus problems/congestion, Arrhythmia, Hypertension, Weeks Of Treatment: 1 History: Peripheral Arterial Disease, Type II Diabetes, History of Burn, Gout, Clustered Wound: No Confinement Anxiety Photos Wound Measurements Length: (cm) 1 Width: (cm) 1.2 Depth: (cm) 0.1 Area: (cm) 0.942 Volume: (cm) 0.094 % Reduction in Area: -66.7% % Reduction in Volume: -64.9% Epithelialization: Medium (34-66%) Tunneling: No Undermining: No Wound Description Classification: Full Thickness Without Exposed Support Structures Wound Margin: Distinct,  outline attached Exudate Amount: Medium Exudate Type: Serous Exudate Color: amber Foul Odor After Cleansing: No Slough/Fibrino Yes Wound Bed Granulation Amount: Large (67-100%) Exposed Structure Granulation Quality: Red Fascia Exposed:  No Necrotic Amount: Small (1-33%) Fat Layer (Subcutaneous Tissue) Exposed: Yes Necrotic Quality: Adherent Slough Tendon Exposed: No Muscle Exposed: No Joint Exposed: No Bone Exposed: No Treatment Notes Wound #199 (Toe Third) Wound Laterality: Right Cleanser Soap and Water Discharge Instruction: May shower and wash wound with dial antibacterial soap and water prior to dressing change. Wound Cleanser Discharge Instruction: Cleanse the wound with wound cleanser prior to applying a clean dressing using gauze sponges, not tissue or cotton balls. Peri-Wound Care Topical Primary Dressing KerraCel Ag Gelling Fiber Dressing, 2x2 in (silver alginate) Discharge Instruction: Apply silver alginate to wound bed as instructed Secondary Dressing Secured With Conforming Stretch Gauze Bandage, Sterile 2x75 (in/in) Discharge Instruction: Secure with stretch gauze as directed. 79M Medipore H Soft Cloth Surgical T ape, 2x2 (in/yd) Discharge Instruction: Secure dressing with tape as directed. Compression Wrap Compression Stockings Add-Ons Electronic Signature(s) Signed: 08/20/2021 5:53:01 PM By: Lorrin Jackson Entered By: Lorrin Jackson on 08/20/2021 16:30:24 -------------------------------------------------------------------------------- Wound Assessment Details Patient Name: Date of Service: [name], [name] 08/20/2021 3:45 PM Medical Record Number: [id number] Patient Account Number: [id number] Date of Birth/Sex: Treating RN: May 12, 1951 (70 y.o. [name] Primary Care Stevenson Windmiller: [name] Other Clinician: Referring Amna Welker: Treating Darnell Stimson/Extender: Ruffin Pyo in Treatment: 291 Wound Status Wound Number: 200 Primary Lymphedema Etiology: Wound Location: Left, Medial Lower Leg Wound Open Wounding Event: Gradually Appeared Status: Date Acquired: 08/20/2021 Comorbid Chronic sinus problems/congestion, Arrhythmia, Hypertension, Weeks Of Treatment:  0 History: Peripheral Arterial Disease, Type II Diabetes, History of Burn, Gout, Clustered Wound: No Confinement Anxiety Photos Wound Measurements Length: (cm) 4 Width: (cm) 1.5 Depth: (cm) 0.1 Area: (cm) 4.712 Volume: (cm) 0.471 % Reduction in Area: 0% % Reduction in Volume: 0% Epithelialization: None Tunneling: No Undermining: No Wound Description Classification: Full Thickness Without Exposed Support Structures Wound Margin: Distinct, outline attached Exudate Amount: Large Exudate Type: Serous Exudate Color: amber Foul Odor After Cleansing: No Slough/Fibrino No Wound Bed Granulation Amount: Large (67-100%) Exposed Structure Granulation Quality: Red Fascia Exposed: No Necrotic Amount: None Present (0%) Fat Layer (Subcutaneous Tissue) Exposed: Yes Tendon Exposed: No Muscle Exposed: No Joint Exposed: No Bone Exposed: No Treatment Notes Wound #200 (Lower Leg) Wound Laterality: Left, Medial Cleanser Soap and Water Discharge Instruction: May shower and wash wound with dial antibacterial soap and water prior to dressing change. Wound Cleanser Discharge Instruction: Cleanse the wound with wound cleanser prior to applying a clean dressing using gauze sponges, not tissue or cotton balls. Peri-Wound Care Zinc Oxide Ointment 30g tube Discharge Instruction: Apply Zinc Oxide to periwound with each dressing change Sween Lotion (Moisturizing lotion) Discharge Instruction: Apply moisturizing lotion as directed Topical Primary Dressing KerraCel Ag Gelling Fiber Dressing, 4x5 in (silver alginate) Discharge Instruction: Apply silver alginate to wound bed as instructed Secondary Dressing ABD Pad, 8x10 Discharge Instruction: Apply over primary dressing as directed. Secured With Compression Wrap FourPress (4 layer compression wrap) Discharge Instruction: ****UNNA BOOT FIRST LAYER APPLIED TO UPPER PORTION OF LOWER LEG.*** Compression Stockings Add-Ons Electronic  Signature(s) Signed: 08/20/2021 5:53:01 PM By: Lorrin Jackson Entered By: Lorrin Jackson on 08/20/2021 16:31:08 -------------------------------------------------------------------------------- Wound Assessment Details Patient Name: Date of Service: [name], DEIKE 08/20/2021 3:45 PM Medical Record Number: [id number] Patient Account Number: [id number] Date of Birth/Sex: Treating RN: [birth year] (70 y.o. [name] Primary Care Tiffannie Sloss:  [name] Other Clinician: Referring Miamarie Moll: Treating Hewitt Garner/Extender: Ruffin Pyo in Treatment: 291 Wound Status Wound Number: [id number] Primary Lymphedema Etiology: Wound Location: Right, Anterior Lower Leg Wound Open Wounding Event: Gradually Appeared Status: Date Acquired: 08/20/2021 Comorbid Chronic sinus problems/congestion, Arrhythmia, Hypertension, Weeks Of Treatment: 0 History: Peripheral Arterial Disease, Type II Diabetes, History of Burn, Gout, Clustered Wound: No Confinement Anxiety Photos Wound Measurements Length: (cm) 1 Width: (cm) 0.8 Depth: (cm) 0.1 Area: (cm) 0.628 Volume: (cm) 0.063 % Reduction in Area: 0% % Reduction in Volume: 0% Epithelialization: None Tunneling: No Undermining: No Wound Description Classification: Full Thickness Without Exposed Support Structures Wound Margin: Distinct, outline attached Exudate Amount: Large Exudate Type: Serous Exudate Color: amber Foul Odor After Cleansing: No Slough/Fibrino No Wound Bed Granulation Amount: Large (67-100%) Exposed Structure Granulation Quality: Red Fascia Exposed: No Necrotic Amount: None Present (0%) Fat Layer (Subcutaneous Tissue) Exposed: Yes Tendon Exposed: No Muscle Exposed: No Joint Exposed: No Bone Exposed: No Treatment Notes Wound #201 (Lower Leg) Wound Laterality: Right, Anterior Cleanser Soap and Water Discharge Instruction: May shower and wash wound with dial antibacterial soap and water prior to  dressing change. Wound Cleanser Discharge Instruction: Cleanse the wound with wound cleanser prior to applying a clean dressing using gauze sponges, not tissue or cotton balls. Peri-Wound Care Zinc Oxide Ointment 30g tube Discharge Instruction: Apply Zinc Oxide to periwound with each dressing change Sween Lotion (Moisturizing lotion) Discharge Instruction: Apply moisturizing lotion as directed Topical Primary Dressing KerraCel Ag Gelling Fiber Dressing, 4x5 in (silver alginate) Discharge Instruction: Apply silver alginate to wound bed as instructed Secondary Dressing ABD Pad, 8x10 Discharge Instruction: Apply over primary dressing as directed. Secured With Compression Wrap FourPress (4 layer compression wrap) Discharge Instruction: ****UNNA BOOT FIRST LAYER APPLIED TO UPPER PORTION OF LOWER LEG.*** Compression Stockings Add-Ons Electronic Signature(s) Signed: 08/20/2021 5:53:01 PM By: Lorrin Jackson Entered By: Lorrin Jackson on 08/20/2021 16:31:59 -------------------------------------------------------------------------------- Wound Assessment Details Patient Name: Date of Service: Kevin RAYNARD, MAPPS 08/20/2021 3:45 PM Medical Record Number: [id number] Patient Account Number: [id number] Date of Birth/Sex: Treating RN: [birth year] (70 y.o. [name] Primary Care Maile Linford: [name] Other Clinician: Referring Yoandri Congrove: Treating Alhaji Mcneal/Extender: Ruffin Pyo in Treatment: 291 Wound Status Wound Number: [id number] Primary Lymphedema Etiology: Wound Location: Right T Fourth oe Wound Open Wounding Event: Gradually Appeared Status: Date Acquired: 08/20/2021 Comorbid Chronic sinus problems/congestion, Arrhythmia, Hypertension, Weeks Of Treatment: 0 History: Peripheral Arterial Disease, Type II Diabetes, History of Burn, Gout, Clustered Wound: No Confinement Anxiety Photos Wound Measurements Length: (cm) 1.5 Width: (cm) 1 Depth: (cm)  0.1 Area: (cm) 1.178 Volume: (cm) 0.118 % Reduction in Area: 0% % Reduction in Volume: 0% Epithelialization: None Tunneling: No Undermining: No Wound Description Classification: Full Thickness Without Exposed Support Structures Wound Margin: Distinct, outline attached Exudate Amount: Medium Exudate Type: Serous Exudate Color: amber Foul Odor After Cleansing: No Slough/Fibrino No Wound Bed Granulation Amount: Large (67-100%) Exposed Structure Granulation Quality: Red Fascia Exposed: No Necrotic Amount: None Present (0%) Fat Layer (Subcutaneous Tissue) Exposed: Yes Tendon Exposed: No Muscle Exposed: No Joint Exposed: No Bone Exposed: No Treatment Notes Wound #202 (Toe Fourth) Wound Laterality: Right Cleanser Soap and Water Discharge Instruction: May shower and wash wound with dial antibacterial soap and water prior to dressing change. Wound Cleanser Discharge Instruction: Cleanse the wound with wound cleanser prior to applying a clean dressing using gauze sponges, not tissue or cotton balls. Peri-Wound Care Topical Primary Dressing KerraCel Ag Gelling Fiber Dressing, 2x2 in (  silver alginate) Discharge Instruction: Apply silver alginate to wound bed as instructed Secondary Dressing Secured With Conforming Stretch Gauze Bandage, Sterile 2x75 (in/in) Discharge Instruction: Secure with stretch gauze as directed. 38M Medipore H Soft Cloth Surgical T ape, 2x2 (in/yd) Discharge Instruction: Secure dressing with tape as directed. Compression Wrap Compression Stockings Add-Ons Electronic Signature(s) Signed: 08/20/2021 5:53:01 PM By: Lorrin Jackson Entered By: Lorrin Jackson on 08/20/2021 16:36:19 -------------------------------------------------------------------------------- Wound Assessment Details Patient Name: Date of Service: Kevin LORIK, GUO 08/20/2021 3:45 PM Medical Record Number: [id number] Patient Account Number: [id number] Date of Birth/Sex: Treating  RN: October 31, 1951 (70 y.o. [name] Primary Care Tucker Steedley: [name] Other Clinician: Referring Chaelyn Bunyan: Treating Jahliyah Trice/Extender: Ruffin Pyo in Treatment: 291 Wound Status Wound Number: [id number] Primary Lymphedema Etiology: Wound Location: Right, Anterior Ankle Wound Open Wounding Event: Gradually Appeared Status: Date Acquired: 08/20/2021 Comorbid Chronic sinus problems/congestion, Arrhythmia, Hypertension, Weeks Of Treatment: 0 History: Peripheral Arterial Disease, Type II Diabetes, History of Burn, Gout, Clustered Wound: No Confinement Anxiety Photos Wound Measurements Length: (cm) 1.5 Width: (cm) 1.5 Depth: (cm) 0.1 Area: (cm) 1.767 Volume: (cm) 0.177 % Reduction in Area: % Reduction in Volume: Epithelialization: None Tunneling: No Undermining: No Wound Description Classification: Full Thickness Without Exposed Support Structures Wound Margin: Distinct, outline attached Exudate Amount: Medium Exudate Type: Serosanguineous Exudate Color: red, brown Foul Odor After Cleansing: No Slough/Fibrino No Wound Bed Granulation Amount: Large (67-100%) Exposed Structure Granulation Quality: Red Fascia Exposed: No Necrotic Amount: Small (1-33%) Fat Layer (Subcutaneous Tissue) Exposed: Yes Necrotic Quality: Eschar Tendon Exposed: No Muscle Exposed: No Joint Exposed: No Bone Exposed: No Treatment Notes Wound #203 (Ankle) Wound Laterality: Right, Anterior Cleanser Soap and Water Discharge Instruction: May shower and wash wound with dial antibacterial soap and water prior to dressing change. Wound Cleanser Discharge Instruction: Cleanse the wound with wound cleanser prior to applying a clean dressing using gauze sponges, not tissue or cotton balls. Peri-Wound Care Zinc Oxide Ointment 30g tube Discharge Instruction: Apply Zinc Oxide to periwound with each dressing change Sween Lotion (Moisturizing lotion) Discharge Instruction:  Apply moisturizing lotion as directed Topical Primary Dressing KerraCel Ag Gelling Fiber Dressing, 4x5 in (silver alginate) Discharge Instruction: Apply silver alginate to wound bed as instructed Secondary Dressing ABD Pad, 8x10 Discharge Instruction: Apply over primary dressing as directed. Secured With Compression Wrap FourPress (4 layer compression wrap) Discharge Instruction: ****UNNA BOOT FIRST LAYER APPLIED TO UPPER PORTION OF LOWER LEG.*** Compression Stockings Add-Ons Electronic Signature(s) Signed: 08/20/2021 5:53:01 PM By: Lorrin Jackson Entered By: Lorrin Jackson on 08/20/2021 16:38:07 -------------------------------------------------------------------------------- Padroni Details Patient Name: Date of Service: [name], Taelon J. 08/20/2021 3:45 PM Medical Record Number: [id number] Patient Account Number: [id number] Date of Birth/Sex: Treating RN: Jun 17, 1951 (70 y.o. [name] Primary Care Dequann Vandervelden: [name] Other Clinician: Referring Lindi Abram: Treating Cathren Sween/Extender: Ruffin Pyo in Treatment: 291 Vital Signs Time Taken: 16:18 Temperature (F): 97.9 Height (in): 70 Pulse (bpm): 76 Weight (lbs): 380.2 Respiratory Rate (breaths/min): 22 Body Mass Index (BMI): 54.5 Blood Pressure (mmHg): 126/69 Capillary Blood Glucose (mg/dl): 162 Reference Range: 80 - 120 mg / dl Electronic Signature(s) Signed: 08/20/2021 5:53:01 PM By: Lorrin Jackson Entered By: Lorrin Jackson on 08/20/2021 16:18:35

## 2021-08-22 ENCOUNTER — Ambulatory Visit: Payer: Medicare Other

## 2021-08-22 ENCOUNTER — Encounter (HOSPITAL_BASED_OUTPATIENT_CLINIC_OR_DEPARTMENT_OTHER): Payer: Medicare Other | Admitting: Physician Assistant

## 2021-08-24 ENCOUNTER — Other Ambulatory Visit: Payer: Self-pay

## 2021-08-24 ENCOUNTER — Encounter (HOSPITAL_BASED_OUTPATIENT_CLINIC_OR_DEPARTMENT_OTHER): Payer: Medicare Other | Admitting: Internal Medicine

## 2021-08-24 ENCOUNTER — Telehealth: Payer: Self-pay

## 2021-08-24 ENCOUNTER — Ambulatory Visit (INDEPENDENT_AMBULATORY_CARE_PROVIDER_SITE_OTHER): Payer: Medicare Other

## 2021-08-24 DIAGNOSIS — I890 Lymphedema, not elsewhere classified: Secondary | ICD-10-CM | POA: Diagnosis not present

## 2021-08-24 DIAGNOSIS — E11621 Type 2 diabetes mellitus with foot ulcer: Secondary | ICD-10-CM | POA: Diagnosis not present

## 2021-08-24 DIAGNOSIS — E11622 Type 2 diabetes mellitus with other skin ulcer: Secondary | ICD-10-CM | POA: Diagnosis not present

## 2021-08-24 DIAGNOSIS — E1140 Type 2 diabetes mellitus with diabetic neuropathy, unspecified: Secondary | ICD-10-CM | POA: Diagnosis not present

## 2021-08-24 DIAGNOSIS — Z794 Long term (current) use of insulin: Secondary | ICD-10-CM | POA: Diagnosis not present

## 2021-08-24 DIAGNOSIS — L97829 Non-pressure chronic ulcer of other part of left lower leg with unspecified severity: Secondary | ICD-10-CM | POA: Diagnosis not present

## 2021-08-24 DIAGNOSIS — L03116 Cellulitis of left lower limb: Secondary | ICD-10-CM | POA: Diagnosis not present

## 2021-08-24 DIAGNOSIS — I87333 Chronic venous hypertension (idiopathic) with ulcer and inflammation of bilateral lower extremity: Secondary | ICD-10-CM | POA: Diagnosis not present

## 2021-08-24 DIAGNOSIS — L97519 Non-pressure chronic ulcer of other part of right foot with unspecified severity: Secondary | ICD-10-CM | POA: Diagnosis not present

## 2021-08-24 DIAGNOSIS — L03115 Cellulitis of right lower limb: Secondary | ICD-10-CM | POA: Diagnosis not present

## 2021-08-24 DIAGNOSIS — L97812 Non-pressure chronic ulcer of other part of right lower leg with fat layer exposed: Secondary | ICD-10-CM | POA: Diagnosis not present

## 2021-08-24 DIAGNOSIS — I10 Essential (primary) hypertension: Secondary | ICD-10-CM | POA: Diagnosis not present

## 2021-08-24 DIAGNOSIS — L97521 Non-pressure chronic ulcer of other 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 of skin: Secondary | ICD-10-CM | POA: Diagnosis not present

## 2021-08-24 LAB — BASIC METABOLIC PANEL
BUN: 41 mg/dL — ABNORMAL HIGH (ref 6–23)
CO2: 24 mEq/L (ref 19–32)
Calcium: 8.6 mg/dL (ref 8.4–10.5)
Chloride: 100 mEq/L (ref 96–112)
Creatinine, Ser: 1.97 mg/dL — ABNORMAL HIGH (ref 0.40–1.50)
GFR: 33.83 mL/min — ABNORMAL LOW (ref >60.00)
Glucose, Bld: 115 mg/dL — ABNORMAL HIGH (ref 70–99)
Potassium: 4.6 mEq/L (ref 3.5–5.1)
Sodium: 139 mEq/L (ref 135–145)

## 2021-08-24 NOTE — Telephone Encounter (Signed)
Blood pressures improved, esp the last 1 wk-->150 syst avg, low 70s diast avg. His kidney function is down a little bit. I want to give him longer on current medication regimen, plan recheck bmet 1 wk, dx uncontrolled HTN.-thx

## 2021-08-24 NOTE — Addendum Note (Signed)
Addended by: Emi Holes D on: 08/24/2021 04:52 PM   Modules accepted: Orders

## 2021-08-24 NOTE — Progress Notes (Signed)
RENAUD, CELLI ([id number]) Visit Report for 08/24/2021 Arrival Information Details Patient Name: Date of Service: CO CORDEL, DREWES 08/24/2021 11:00 A M Medical Record Number: [id number] Patient Account Number: [id number] Date of Birth/Sex: Treating RN: [birth year] (70 y.o. [name], [name] Primary Care Zephan Beauchaine: Nicoletta Ba Other Clinician: Referring Dareen Gutzwiller: Treating Dawaun Brancato/Extender: Debe Coder in Treatment: 291 Visit Information History Since Last Visit Added or deleted any medications: No Patient Arrived: Walker Any new allergies or adverse reactions: No Arrival Time: 11:50 Had a fall or experienced change in No Accompanied By: self activities of daily living that may affect Transfer Assistance: None risk of falls: Secondary Verification Process Completed: Yes Signs or symptoms of abuse/neglect since No Patient Requires Transmission-Based Precautions: No last visito Patient Has Alerts: Yes Hospitalized since last visit: No Implantable device outside of the clinic No excluding cellular tissue based products placed in the center since last visit: Has Dressing in Place as Prescribed: Yes Has Compression in Place as Prescribed: Yes Has Footwear/Offloading in Place as Yes Prescribed: Left: Surgical Shoe with Pressure Relief Insole Right: Surgical Shoe with Pressure Relief Insole Pain Present Now: Yes Electronic Signature(s) Signed: 08/24/2021 1:21:44 PM By: Shawn Stall RN, BSN Entered By: Shawn Stall on 08/24/2021 12:53:12 -------------------------------------------------------------------------------- Compression Therapy Details Patient Name: Date of Service: [name], Tavian J. 08/24/2021 11:00 A M Medical Record Number: [id number] Patient Account Number: [id number] Date of Birth/Sex: Treating RN: December 14, 1950 (70 y.o. [name] Primary Care Jannatul Wojdyla: Nicoletta Ba Other Clinician: Referring Jann Milkovich: Treating  Marquasha Brutus/Extender: Debe Coder in Treatment: 291 Compression Therapy Performed for Wound Assessment: Wound #198 Right,Posterior Lower Leg Performed By: Clinician Shawn Stall, RN Compression Type: Four Layer Electronic Signature(s) Signed: 08/24/2021 1:21:44 PM By: Shawn Stall RN, BSN Entered By: Shawn Stall on 08/24/2021 12:54:27 -------------------------------------------------------------------------------- Compression Therapy Details Patient Name: Date of Service: [name], Mayer J. 08/24/2021 11:00 A M Medical Record Number: [id number] Patient Account Number: [id number] Date of Birth/Sex: Treating RN: [birth year] (70 y.o. [name] Primary Care Aviya Jarvie: Nicoletta Ba Other Clinician: Referring Ahmiyah Coil: Treating Raisha Brabender/Extender: Debe Coder in Treatment: 291 Compression Therapy Performed for Wound Assessment: Wound #200 Left,Medial Lower Leg Performed By: Clinician Shawn Stall, RN Compression Type: Four Layer Electronic Signature(s) Signed: 08/24/2021 1:21:44 PM By: Shawn Stall RN, BSN Entered By: Shawn Stall on 08/24/2021 12:54:27 -------------------------------------------------------------------------------- Compression Therapy Details Patient Name: Date of Service: [name], Jessey J. 08/24/2021 11:00 A M Medical Record Number: [id number] Patient Account Number: [id number] Date of Birth/Sex: Treating RN: 1951/01/14 (70 y.o. [name] Primary Care Yaretsi Humphres: Nicoletta Ba Other Clinician: Referring Saira Kramme: Treating Wyatt Thorstenson/Extender: Debe Coder in Treatment: 291 Compression Therapy Performed for Wound Assessment: Wound #201 Right,Anterior Lower Leg Performed By: Clinician Shawn Stall, RN Compression Type: Four Layer Electronic Signature(s) Signed: 08/24/2021 1:21:44 PM By: Shawn Stall RN, BSN Entered By: Shawn Stall on 08/24/2021  12:54:27 -------------------------------------------------------------------------------- Compression Therapy Details Patient Name: Date of Service: [name], Elver J. 08/24/2021 11:00 A M Medical Record Number: [id number] Patient Account Number: [id number] Date of Birth/Sex: Treating RN: 12/25/1950 (70 y.o. [name] Primary Care Avelynn Sellin: Nicoletta Ba Other Clinician: Referring Tanya Crothers: Treating Mikaya Bunner/Extender: Debe Coder in Treatment: 291 Compression Therapy Performed for Wound Assessment: Wound #203 Right,Anterior Ankle Performed By: Clinician Shawn Stall, RN Compression Type: Four Layer Electronic Signature(s) Signed: 08/24/2021 1:21:44 PM By: Shawn Stall RN, BSN Entered By: Shawn Stall on 08/24/2021 12:54:27 -------------------------------------------------------------------------------- Encounter Discharge Information Details Patient Name: Date  of Service: [name], Daronte J. 08/24/2021 11:00 A M Medical Record Number: [id number] Patient Account Number: [id number] Date of Birth/Sex: Treating RN: 02-28-1951 (70 y.o. [name] Primary Care Navah Grondin: Nicoletta Ba Other Clinician: Referring Giannis Corpuz: Treating Latasia Silberstein/Extender: Debe Coder in Treatment: [phone] Encounter Discharge Information Items Discharge Condition: Stable Ambulatory Status: Walker Discharge Destination: Home Transportation: Private Auto Accompanied By: self Schedule Follow-up Appointment: Yes Clinical Summary of Care: Electronic Signature(s) Signed: 08/24/2021 1:21:44 PM By: Shawn Stall RN, BSN Entered By: Shawn Stall on 08/24/2021 12:56:40 -------------------------------------------------------------------------------- Patient/Caregiver Education Details Patient Name: Date of Service: [name], Shepherd J. 10/21/2022andnbsp11:00 A M Medical Record Number: [id number] Patient Account Number: [id number] Date of  Birth/Gender: Treating RN: 1951-06-29 (70 y.o. [name] Primary Care Physician: Nicoletta Ba Other Clinician: Referring Physician: Treating Physician/Extender: Debe Coder in Treatment: 291 Education Assessment Education Provided To: Patient Education Topics Provided Wound/Skin Impairment: Handouts: Skin Care Do's and Dont's Methods: Explain/Verbal Responses: Reinforcements needed Electronic Signature(s) Signed: 08/24/2021 1:21:44 PM By: Shawn Stall RN, BSN Entered By: Shawn Stall on 08/24/2021 12:55:33 -------------------------------------------------------------------------------- Wound Assessment Details Patient Name: Date of Service: [name], Khaliel J. 08/24/2021 11:00 A M Medical Record Number: [id number] Patient Account Number: [id number] Date of Birth/Sex: Treating RN: August 18, 1951 (70 y.o. [name] Primary Care Kamry Faraci: Nicoletta Ba Other Clinician: Referring Morning Halberg: Treating Olden Klauer/Extender: Debe Coder in Treatment: 291 Wound Status Wound Number: 198 Primary Etiology: Lymphedema Wound Location: Right, Posterior Lower Leg Wound Status: Open Wounding Event: Blister Date Acquired: 08/08/2021 Weeks Of Treatment: 2 Clustered Wound: No Wound Measurements Length: (cm) 1 Width: (cm) 0.9 Depth: (cm) 0.1 Area: (cm) 0.707 Volume: (cm) 0.071 % Reduction in Area: 95.3% % Reduction in Volume: 95.3% Wound Description Classification: Partial Thickness Exudate Amount: Large Exudate Type: Serous Exudate Color: amber Treatment Notes Wound #198 (Lower Leg) Wound Laterality: Right, Posterior Cleanser Soap and Water Discharge Instruction: May shower and wash wound with dial antibacterial soap and water prior to dressing change. Wound Cleanser Discharge Instruction: Cleanse the wound with wound cleanser prior to applying a clean dressing using gauze sponges, not tissue or  cotton balls. Peri-Wound Care Zinc Oxide Ointment 30g tube Discharge Instruction: Apply Zinc Oxide to periwound with each dressing change Sween Lotion (Moisturizing lotion) Discharge Instruction: Apply moisturizing lotion as directed Topical Primary Dressing KerraCel Ag Gelling Fiber Dressing, 4x5 in (silver alginate) Discharge Instruction: Apply silver alginate to wound bed as instructed Secondary Dressing ABD Pad, 8x10 Discharge Instruction: Apply over primary dressing as directed. Secured With Compression Wrap FourPress (4 layer compression wrap) Discharge Instruction: ****UNNA BOOT FIRST LAYER APPLIED TO UPPER PORTION OF LOWER LEG.*** Compression Stockings Add-Ons Electronic Signature(s) Signed: 08/24/2021 1:21:44 PM By: Shawn Stall RN, BSN Entered By: Shawn Stall on 08/24/2021 12:53:51 -------------------------------------------------------------------------------- Wound Assessment Details Patient Name: Date of Service: [name], Cleotis J. 08/24/2021 11:00 A M Medical Record Number: [id number] Patient Account Number: [id number] Date of Birth/Sex: Treating RN: [birth year] (70 y.o. [name] Primary Care Valen Mascaro: Nicoletta Ba Other Clinician: Referring Lakashia Collison: Treating Jaidyn Kuhl/Extender: Debe Coder in Treatment: 291 Wound Status Wound Number: 199 Primary Etiology: Lymphedema Wound Location: Right T Third oe Wound Status: Open Wounding Event: Gradually Appeared Date Acquired: 08/08/2021 Weeks Of Treatment: 2 Clustered Wound: No Wound Measurements Length: (cm) 1 Width: (cm) 1.2 Depth: (cm) 0.1 Area: (cm) 0.942 Volume: (cm) 0.094 % Reduction in Area: -66.7% % Reduction in Volume: -64.9% Wound Description Classification: Full Thickness Without Exposed Support Structu Exudate  Amount: Medium Exudate Type: Serous Exudate Color: amber res Treatment Notes Wound #199 (Toe Third) Wound Laterality: Right Cleanser Soap and  Water Discharge Instruction: May shower and wash wound with dial antibacterial soap and water prior to dressing change. Wound Cleanser Discharge Instruction: Cleanse the wound with wound cleanser prior to applying a clean dressing using gauze sponges, not tissue or cotton balls. Peri-Wound Care Topical Primary Dressing KerraCel Ag Gelling Fiber Dressing, 2x2 in (silver alginate) Discharge Instruction: Apply silver alginate to wound bed as instructed Secondary Dressing Secured With Conforming Stretch Gauze Bandage, Sterile 2x75 (in/in) Discharge Instruction: Secure with stretch gauze as directed. 44M Medipore H Soft Cloth Surgical T ape, 2x2 (in/yd) Discharge Instruction: Secure dressing with tape as directed. Compression Wrap Compression Stockings Add-Ons Electronic Signature(s) Signed: 08/24/2021 1:21:44 PM By: Shawn Stall RN, BSN Entered By: Shawn Stall on 08/24/2021 12:53:51 -------------------------------------------------------------------------------- Wound Assessment Details Patient Name: Date of Service: [name], Joash J. 08/24/2021 11:00 A M Medical Record Number: [id number] Patient Account Number: [id number] Date of Birth/Sex: Treating RN: 07-09-1951 (70 y.o. [name] Primary Care Arcadio Cope: Nicoletta Ba Other Clinician: Referring Patton Rabinovich: Treating Rhesa Forsberg/Extender: Debe Coder in Treatment: 291 Wound Status Wound Number: 200 Primary Etiology: Lymphedema Wound Location: Left, Medial Lower Leg Wound Status: Open Wounding Event: Gradually Appeared Date Acquired: 08/20/2021 Weeks Of Treatment: 0 Clustered Wound: No Wound Measurements Length: (cm) 4 Width: (cm) 1.5 Depth: (cm) 0.1 Area: (cm) 4.712 Volume: (cm) 0.471 % Reduction in Area: 0% % Reduction in Volume: 0% Wound Description Classification: Full Thickness Without Exposed Support Structur Exudate Amount: Large Exudate Type: Serous Exudate Color:  amber es Treatment Notes Wound #200 (Lower Leg) Wound Laterality: Left, Medial Cleanser Soap and Water Discharge Instruction: May shower and wash wound with dial antibacterial soap and water prior to dressing change. Wound Cleanser Discharge Instruction: Cleanse the wound with wound cleanser prior to applying a clean dressing using gauze sponges, not tissue or cotton balls. Peri-Wound Care Zinc Oxide Ointment 30g tube Discharge Instruction: Apply Zinc Oxide to periwound with each dressing change Sween Lotion (Moisturizing lotion) Discharge Instruction: Apply moisturizing lotion as directed Topical Primary Dressing KerraCel Ag Gelling Fiber Dressing, 4x5 in (silver alginate) Discharge Instruction: Apply silver alginate to wound bed as instructed Secondary Dressing ABD Pad, 8x10 Discharge Instruction: Apply over primary dressing as directed. Secured With Compression Wrap FourPress (4 layer compression wrap) Discharge Instruction: ****UNNA BOOT FIRST LAYER APPLIED TO UPPER PORTION OF LOWER LEG.*** Compression Stockings Add-Ons Electronic Signature(s) Signed: 08/24/2021 1:21:44 PM By: Shawn Stall RN, BSN Entered By: Shawn Stall on 08/24/2021 12:53:51 -------------------------------------------------------------------------------- Wound Assessment Details Patient Name: Date of Service: [name], Olson J. 08/24/2021 11:00 A M Medical Record Number: [id number] Patient Account Number: [id number] Date of Birth/Sex: Treating RN: 16-Jun-1951 (70 y.o. [name] Primary Care Ivalene Platte: Nicoletta Ba Other Clinician: Referring Shaquoia Miers: Treating Dorismar Chay/Extender: Debe Coder in Treatment: 291 Wound Status Wound Number: 201 Primary Etiology: Lymphedema Wound Location: Right, Anterior Lower Leg Wound Status: Open Wounding Event: Gradually Appeared Date Acquired: 08/20/2021 Weeks Of Treatment: 0 Clustered Wound: No Wound Measurements Length:  (cm) 1 Width: (cm) 0.8 Depth: (cm) 0.1 Area: (cm) 0.628 Volume: (cm) 0.063 % Reduction in Area: 0% % Reduction in Volume: 0% Wound Description Classification: Full Thickness Without Exposed Support Structu Exudate Amount: Large Exudate Type: Serous Exudate Color: amber res Treatment Notes Wound #201 (Lower Leg) Wound Laterality: Right, Anterior Cleanser Soap and Water Discharge Instruction: May shower and wash wound with dial antibacterial  soap and water prior to dressing change. Wound Cleanser Discharge Instruction: Cleanse the wound with wound cleanser prior to applying a clean dressing using gauze sponges, not tissue or cotton balls. Peri-Wound Care Zinc Oxide Ointment 30g tube Discharge Instruction: Apply Zinc Oxide to periwound with each dressing change Sween Lotion (Moisturizing lotion) Discharge Instruction: Apply moisturizing lotion as directed Topical Primary Dressing KerraCel Ag Gelling Fiber Dressing, 4x5 in (silver alginate) Discharge Instruction: Apply silver alginate to wound bed as instructed Secondary Dressing ABD Pad, 8x10 Discharge Instruction: Apply over primary dressing as directed. Secured With Compression Wrap FourPress (4 layer compression wrap) Discharge Instruction: ****UNNA BOOT FIRST LAYER APPLIED TO UPPER PORTION OF LOWER LEG.*** Compression Stockings Add-Ons Electronic Signature(s) Signed: 08/24/2021 1:21:44 PM By: Shawn Stall RN, BSN Entered By: Shawn Stall on 08/24/2021 12:53:52 -------------------------------------------------------------------------------- Wound Assessment Details Patient Name: Date of Service: [name], Josiel J. 08/24/2021 11:00 A M Medical Record Number: [id number] Patient Account Number: [id number] Date of Birth/Sex: Treating RN: January 26, 1951 (70 y.o. [name] Primary Care [name] [name]: Nicoletta Ba Other Clinician: Referring Bernhard Koskinen: Treating Aviyah Swetz/Extender: Debe Coder  in Treatment: 291 Wound Status Wound Number: 202 Primary Etiology: Lymphedema Wound Location: Right T Fourth oe Wound Status: Open Wounding Event: Gradually Appeared Date Acquired: 08/20/2021 Weeks Of Treatment: 0 Clustered Wound: No Wound Measurements Length: (cm) 1.5 Width: (cm) 1 Depth: (cm) 0.1 Area: (cm) 1.178 Volume: (cm) 0.118 % Reduction in Area: 0% % Reduction in Volume: 0% Wound Description Classification: Full Thickness Without Exposed Support Structu Exudate Amount: Medium Exudate Type: Serous Exudate Color: amber res Treatment Notes Wound #202 (Toe Fourth) Wound Laterality: Right Cleanser Soap and Water Discharge Instruction: May shower and wash wound with dial antibacterial soap and water prior to dressing change. Wound Cleanser Discharge Instruction: Cleanse the wound with wound cleanser prior to applying a clean dressing using gauze sponges, not tissue or cotton balls. Peri-Wound Care Topical Primary Dressing KerraCel Ag Gelling Fiber Dressing, 2x2 in (silver alginate) Discharge Instruction: Apply silver alginate to wound bed as instructed Secondary Dressing Secured With Conforming Stretch Gauze Bandage, Sterile 2x75 (in/in) Discharge Instruction: Secure with stretch gauze as directed. 23M Medipore H Soft Cloth Surgical T ape, 2x2 (in/yd) Discharge Instruction: Secure dressing with tape as directed. Compression Wrap Compression Stockings Add-Ons Electronic Signature(s) Signed: 08/24/2021 1:21:44 PM By: Shawn Stall RN, BSN Entered By: Shawn Stall on 08/24/2021 12:53:52 -------------------------------------------------------------------------------- Wound Assessment Details Patient Name: Date of Service: [name], Dahl J. 08/24/2021 11:00 A M Medical Record Number: [id number] Patient Account Number: [id number] Date of Birth/Sex: Treating RN: 30-Mar-1951 (70 y.o. [name] Primary Care Simi Briel: Nicoletta Ba Other  Clinician: Referring Stonewall Doss: Treating Pate Aylward/Extender: Debe Coder in Treatment: 291 Wound Status Wound Number: 203 Primary Etiology: Lymphedema Wound Location: Right, Anterior Ankle Wound Status: Open Wounding Event: Gradually Appeared Date Acquired: 08/20/2021 Weeks Of Treatment: 0 Clustered Wound: No Wound Measurements Length: (cm) 1.5 Width: (cm) 1.5 Depth: (cm) 0.1 Area: (cm) 1.767 Volume: (cm) 0.177 % Reduction in Area: 0% % Reduction in Volume: 0% Wound Description Classification: Full Thickness Without Exposed Support Structu Exudate Amount: Medium Exudate Type: Serosanguineous Exudate Color: red, brown res Treatment Notes Wound #203 (Ankle) Wound Laterality: Right, Anterior Cleanser Soap and Water Discharge Instruction: May shower and wash wound with dial antibacterial soap and water prior to dressing change. Wound Cleanser Discharge Instruction: Cleanse the wound with wound cleanser prior to applying a clean dressing using gauze sponges, not tissue or cotton balls. Peri-Wound Care Zinc Oxide  Ointment 30g tube Discharge Instruction: Apply Zinc Oxide to periwound with each dressing change Sween Lotion (Moisturizing lotion) Discharge Instruction: Apply moisturizing lotion as directed Topical Primary Dressing KerraCel Ag Gelling Fiber Dressing, 4x5 in (silver alginate) Discharge Instruction: Apply silver alginate to wound bed as instructed Secondary Dressing ABD Pad, 8x10 Discharge Instruction: Apply over primary dressing as directed. Secured With Compression Wrap FourPress (4 layer compression wrap) Discharge Instruction: ****UNNA BOOT FIRST LAYER APPLIED TO UPPER PORTION OF LOWER LEG.*** Compression Stockings Add-Ons Electronic Signature(s) Signed: 08/24/2021 1:21:44 PM By: Shawn Stall RN, BSN Entered By: Shawn Stall on 08/24/2021 12:53:52

## 2021-08-24 NOTE — Telephone Encounter (Signed)
Pt brought in BP readings from 10/13-10/21. Placed on provider desk for review.  Please review and advise

## 2021-08-24 NOTE — Telephone Encounter (Signed)
LM for pt to returncall

## 2021-08-27 NOTE — Progress Notes (Signed)
ADALBERT, ALBERTO ([id number]) Visit Report for 08/24/2021 SuperBill Details Patient Name: Date of Service: CO RIYAAN, HEROUX 08/24/2021 Medical Record Number: [id number] Patient Account Number: [id number] Date of Birth/Sex: Treating RN: 1951-08-22 (70 y.o. 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 Electronic Signature(s) Signed: 08/24/2021 1:21:44 PM By: Shawn Stall RN, BSN Signed: 08/27/2021 4:28:21 PM By: Geralyn Corwin DO Entered By: Shawn Stall on 08/24/2021 12:56:51

## 2021-08-27 NOTE — Telephone Encounter (Signed)
OK for lab friday

## 2021-08-27 NOTE — Telephone Encounter (Signed)
Spoke with pt, he advised he has been taking doxycycline for cellulitis and will finish this med on Wednesday. He will be out of town next week. Please advise if he can schedule for lab visit this Friday or if he should wait longer.

## 2021-08-27 NOTE — Telephone Encounter (Addendum)
Spoke with pt regarding recommendations,voiced understanding. Lab visit scheduled

## 2021-08-29 ENCOUNTER — Other Ambulatory Visit: Payer: Self-pay

## 2021-08-29 ENCOUNTER — Encounter (HOSPITAL_BASED_OUTPATIENT_CLINIC_OR_DEPARTMENT_OTHER): Payer: Medicare Other | Admitting: Physician Assistant

## 2021-08-29 DIAGNOSIS — E11622 Type 2 diabetes mellitus with other skin ulcer: Secondary | ICD-10-CM | POA: Diagnosis not present

## 2021-08-29 DIAGNOSIS — L97829 Non-pressure chronic ulcer of other part of left lower leg with unspecified severity: Secondary | ICD-10-CM | POA: Diagnosis not present

## 2021-08-29 DIAGNOSIS — L03116 Cellulitis of left lower limb: Secondary | ICD-10-CM | POA: Diagnosis not present

## 2021-08-29 DIAGNOSIS — E1140 Type 2 diabetes mellitus with diabetic neuropathy, unspecified: Secondary | ICD-10-CM | POA: Diagnosis not present

## 2021-08-29 DIAGNOSIS — I890 Lymphedema, not elsewhere classified: Secondary | ICD-10-CM | POA: Diagnosis not present

## 2021-08-29 DIAGNOSIS — L97521 Non-pressure chronic ulcer of other 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 of skin: Secondary | ICD-10-CM | POA: Diagnosis not present

## 2021-08-29 DIAGNOSIS — L03115 Cellulitis of right lower limb: Secondary | ICD-10-CM | POA: Diagnosis not present

## 2021-08-29 DIAGNOSIS — E11621 Type 2 diabetes mellitus with foot ulcer: Secondary | ICD-10-CM | POA: Diagnosis not present

## 2021-08-29 DIAGNOSIS — I87333 Chronic venous hypertension (idiopathic) with ulcer and inflammation of bilateral lower extremity: Secondary | ICD-10-CM | POA: Diagnosis not present

## 2021-08-29 DIAGNOSIS — L97519 Non-pressure chronic ulcer of other part of right foot with unspecified severity: Secondary | ICD-10-CM | POA: Diagnosis not present

## 2021-08-29 DIAGNOSIS — Z794 Long term (current) use of insulin: Secondary | ICD-10-CM | POA: Diagnosis not present

## 2021-08-29 DIAGNOSIS — L97812 Non-pressure chronic ulcer of other part of right lower leg with fat layer exposed: Secondary | ICD-10-CM | POA: Diagnosis not present

## 2021-08-29 NOTE — Progress Notes (Signed)
HERBERT, MARKEN ([id number]) Visit Report for 08/29/2021 SuperBill Details Patient Name: Date of Service: CO [name], [name] 08/29/2021 Medical Record Number: [id number] Patient Account Number: [id number] Date of Birth/Sex: Treating RN: 09/04/1951 (70 y.o. 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 Electronic Signature(s) Signed: 08/29/2021 4:55:06 PM By: Lenda Kelp PA-C Signed: 08/29/2021 5:43:04 PM By: Shawn Stall RN, BSN Entered By: Shawn Stall on 08/29/2021 11:53:01
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
## 2021-08-31 ENCOUNTER — Other Ambulatory Visit: Payer: Self-pay

## 2021-08-31 ENCOUNTER — Ambulatory Visit (INDEPENDENT_AMBULATORY_CARE_PROVIDER_SITE_OTHER): Payer: Medicare Other

## 2021-08-31 ENCOUNTER — Telehealth: Payer: Self-pay

## 2021-08-31 DIAGNOSIS — I10 Essential (primary) hypertension: Secondary | ICD-10-CM | POA: Diagnosis not present

## 2021-08-31 DIAGNOSIS — M5451 Vertebrogenic low back pain: Secondary | ICD-10-CM | POA: Diagnosis not present

## 2021-08-31 DIAGNOSIS — M9906 Segmental and somatic dysfunction of lower extremity: Secondary | ICD-10-CM | POA: Diagnosis not present

## 2021-08-31 DIAGNOSIS — M9904 Segmental and somatic dysfunction of sacral region: Secondary | ICD-10-CM | POA: Diagnosis not present

## 2021-08-31 DIAGNOSIS — M25561 Pain in right knee: Secondary | ICD-10-CM | POA: Diagnosis not present

## 2021-08-31 DIAGNOSIS — M9903 Segmental and somatic dysfunction of lumbar region: Secondary | ICD-10-CM | POA: Diagnosis not present

## 2021-08-31 DIAGNOSIS — M9905 Segmental and somatic dysfunction of pelvic region: Secondary | ICD-10-CM | POA: Diagnosis not present

## 2021-08-31 LAB — BASIC METABOLIC PANEL
BUN: 29 mg/dL — ABNORMAL HIGH (ref 6–23)
CO2: 26 mEq/L (ref 19–32)
Calcium: 8.7 mg/dL (ref 8.4–10.5)
Chloride: 101 mEq/L (ref 96–112)
Creatinine, Ser: 1.92 mg/dL — ABNORMAL HIGH (ref 0.40–1.50)
GFR: 34.88 mL/min — ABNORMAL LOW (ref >60.00)
Glucose, Bld: 130 mg/dL — ABNORMAL HIGH (ref 70–99)
Potassium: 4.8 mEq/L (ref 3.5–5.1)
Sodium: 141 mEq/L (ref 135–145)

## 2021-08-31 NOTE — Telephone Encounter (Signed)
Pt brought BP readings for review. Placed on PCP desk to review and further recommendations, if needed.

## 2021-09-03 ENCOUNTER — Encounter: Payer: Self-pay | Admitting: Family Medicine

## 2021-09-03 MED ORDER — ISOSORBIDE MONONITRATE 10 MG PO TABS: 10.0000 mg | ORAL_TABLET | Freq: Every day | ORAL | 0 refills | Status: DC

## 2021-09-03 NOTE — Telephone Encounter (Signed)
Spoke with pt regarding recommendations,voiced understanding. Rx sent to local pharmacy, mychart message sent with f/u instructions.

## 2021-09-03 NOTE — Telephone Encounter (Signed)
Pt also has placard form that has been filled out for pick up

## 2021-09-03 NOTE — Telephone Encounter (Signed)
LM for pt to return call to discuss.  

## 2021-09-03 NOTE — Telephone Encounter (Signed)
BP avg 180/70, avg HR 70 (hx brady). Renal function stable but I'd like it to come back up to his baseline. Cut lasix back to 1 and 1/2 tabs twice a day. For new bp med pls eRx imdur 10mg , 1 tab qd, #30, RF x 0. Put him on my schedule for f/u at the end of this week for f/u HTN and kidney function-thx

## 2021-09-03 NOTE — Addendum Note (Signed)
Addended by: Emi Holes D on: 09/03/2021 04:51 PM   Modules accepted: Orders

## 2021-09-03 NOTE — Telephone Encounter (Signed)
Spoke with pt and advised of recommendations, voiced understanding. He is currently out of town and will not be back until Sunday. Does he need to start bp med prior to returning? When should f/u for HTN and kidney function be?   Please review and advise

## 2021-09-03 NOTE — Telephone Encounter (Signed)
I would prefer he start the isosorbide (new med) prior to returning home. OK for f/u HTN next week and we'll repeat bmet at that time.

## 2021-09-05 ENCOUNTER — Encounter (HOSPITAL_BASED_OUTPATIENT_CLINIC_OR_DEPARTMENT_OTHER): Payer: Medicare Other | Admitting: Physician Assistant

## 2021-09-11 ENCOUNTER — Encounter: Payer: Self-pay | Admitting: Family Medicine

## 2021-09-12 ENCOUNTER — Other Ambulatory Visit: Payer: Self-pay

## 2021-09-12 ENCOUNTER — Encounter (HOSPITAL_BASED_OUTPATIENT_CLINIC_OR_DEPARTMENT_OTHER): Payer: Medicare Other | Attending: Physician Assistant | Admitting: Physician Assistant

## 2021-09-12 DIAGNOSIS — L97829 Non-pressure chronic ulcer of other part of left lower leg with unspecified severity: Secondary | ICD-10-CM | POA: Diagnosis not present

## 2021-09-12 DIAGNOSIS — M9906 Segmental and somatic dysfunction of lower extremity: Secondary | ICD-10-CM | POA: Diagnosis not present

## 2021-09-12 DIAGNOSIS — M9904 Segmental and somatic dysfunction of sacral region: Secondary | ICD-10-CM | POA: Diagnosis not present

## 2021-09-12 DIAGNOSIS — E1151 Type 2 diabetes mellitus with diabetic peripheral angiopathy without gangrene: Secondary | ICD-10-CM | POA: Diagnosis not present

## 2021-09-12 DIAGNOSIS — L97519 Non-pressure chronic ulcer of other part of right foot with unspecified severity: Secondary | ICD-10-CM | POA: Diagnosis not present

## 2021-09-12 DIAGNOSIS — M5451 Vertebrogenic low back pain: Secondary | ICD-10-CM | POA: Diagnosis not present

## 2021-09-12 DIAGNOSIS — M25561 Pain in right knee: Secondary | ICD-10-CM | POA: Diagnosis not present

## 2021-09-12 DIAGNOSIS — L97521 Non-pressure chronic ulcer of other 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 of skin: Secondary | ICD-10-CM | POA: Diagnosis not present

## 2021-09-12 DIAGNOSIS — M9905 Segmental and somatic dysfunction of pelvic region: Secondary | ICD-10-CM | POA: Diagnosis not present

## 2021-09-12 DIAGNOSIS — M9903 Segmental and somatic dysfunction of lumbar region: Secondary | ICD-10-CM | POA: Diagnosis not present

## 2021-09-12 DIAGNOSIS — L97812 Non-pressure chronic ulcer of other part of right lower leg with fat layer exposed: Secondary | ICD-10-CM | POA: Insufficient documentation

## 2021-09-12 NOTE — Progress Notes (Signed)
[name], [name] ([id number]) Visit Report for 09/12/2021 Arrival Information Details Patient Name: Date of Service: CO FULLER, MAKIN 09/12/2021 10:30 A M Medical Record Number: [id number] Patient Account Number: [id number] Date of Birth/Sex: Treating RN: October 31, 1951 (70 y.o. [name] Primary Care Quade Ramirez: Nicoletta Ba Other Clinician: Referring Francys Bolin: Treating Sanders Manninen/Extender: Adele Dan in Treatment: 294 Visit Information History Since Last Visit Added or deleted any medications: Yes Patient Arrived: Walker Any new allergies or adverse reactions: No Arrival Time: 10:32 Had a fall or experienced change in No Accompanied By: self activities of daily living that may affect Transfer Assistance: None risk of falls: Patient Identification Verified: Yes Signs or symptoms of abuse/neglect since last visito No Secondary Verification Process Completed: Yes Hospitalized since last visit: No Patient Requires Transmission-Based Precautions: No Implantable device outside of the clinic excluding No Patient Has Alerts: Yes cellular tissue based products placed in the center since last visit: Has Dressing in Place as Prescribed: Yes Has Compression in Place as Prescribed: Yes Pain Present Now: No Electronic Signature(s) Signed: 09/12/2021 5:03:19 PM By: Zenaida Deed RN, BSN Entered By: Zenaida Deed on 09/12/2021 10:33:40 -------------------------------------------------------------------------------- Compression Therapy Details Patient Name: Date of Service: [name], Jareth J. 09/12/2021 10:30 A M Medical Record Number: [id number] Patient Account Number: [id number] Date of Birth/Sex: Treating RN: 1951-04-09 (70 y.o. [name] Primary Care Journiee Feldkamp: Nicoletta Ba Other Clinician: Referring Cadince Hilscher: Treating Krrish Freund/Extender: Adele Dan in Treatment: 294 Compression Therapy Performed for Wound Assessment:  Wound #198 Right,Posterior Lower Leg Performed By: Clinician Shawn Stall, RN Compression Type: Four Layer Electronic Signature(s) Signed: 09/12/2021 5:29:58 PM By: Shawn Stall RN, BSN Entered By: Shawn Stall on 09/12/2021 13:30:08 -------------------------------------------------------------------------------- Compression Therapy Details Patient Name: Date of Service: [name], Eion J. 09/12/2021 10:30 A M Medical Record Number: [id number] Patient Account Number: [id number] Date of Birth/Sex: Treating RN: 1951/10/27 (70 y.o. [name] Primary Care Imani Fiebelkorn: Nicoletta Ba Other Clinician: Referring Jett Kulzer: Treating Daiquan Resnik/Extender: Adele Dan in Treatment: 294 Compression Therapy Performed for Wound Assessment: Wound #200 Left,Medial Lower Leg Performed By: Clinician Shawn Stall, RN Compression Type: Four Layer Electronic Signature(s) Signed: 09/12/2021 5:29:58 PM By: Shawn Stall RN, BSN Entered By: Shawn Stall on 09/12/2021 13:30:08 -------------------------------------------------------------------------------- Compression Therapy Details Patient Name: Date of Service: [name], Trayvon J. 09/12/2021 10:30 A M Medical Record Number: [id number] Patient Account Number: [id number] Date of Birth/Sex: Treating RN: 1951-08-12 (70 y.o. [name] Primary Care Denea Cheaney: Nicoletta Ba Other Clinician: Referring Abed Schar: Treating Royce Stegman/Extender: Adele Dan in Treatment: 294 Compression Therapy Performed for Wound Assessment: Wound #201 Right,Anterior Lower Leg Performed By: Clinician Shawn Stall, RN Compression Type: Four Layer Electronic Signature(s) Signed: 09/12/2021 5:29:58 PM By: Shawn Stall RN, BSN Entered By: Shawn Stall on 09/12/2021 13:30:08 -------------------------------------------------------------------------------- Compression Therapy Details Patient Name: Date of Service: [name], Vittorio J. 09/12/2021 10:30 A M Medical Record Number: [id number] Patient Account Number: [id number] Date of Birth/Sex: Treating RN: 1950/12/28 (70 y.o. [name] Primary Care Ilia Engelbert: Nicoletta Ba Other Clinician: Referring Skyrah Krupp: Treating Seri Kimmer/Extender: Adele Dan in Treatment: 294 Compression Therapy Performed for Wound Assessment: Wound #203 Right,Anterior Ankle Performed By: Clinician Shawn Stall, RN Compression Type: Four Layer Electronic Signature(s) Signed: 09/12/2021 5:29:58 PM By: Shawn Stall RN, BSN Entered By: Shawn Stall on 09/12/2021 13:30:08 -------------------------------------------------------------------------------- Encounter Discharge Information Details Patient Name: Date of Service: [name], Benjiman J. 09/12/2021 10:30 A M Medical Record  Number: [id number] Patient Account Number: [id number] Date of Birth/Sex: Treating RN: 05-Dec-1950 (70 y.o. [name] Primary Care Sallye Lunz: Other Clinician: Nicoletta Ba Referring Klarisa Barman: Treating Naser Schuld/Extender: Adele Dan in Treatment: 294 Encounter Discharge Information Items Discharge Condition: Stable Ambulatory Status: Walker Discharge Destination: Home Transportation: Private Auto Accompanied By: self Schedule Follow-up Appointment: Yes Clinical Summary of Care: Electronic Signature(s) Signed: 09/12/2021 5:29:58 PM By: Shawn Stall RN, BSN Entered By: Shawn Stall on 09/12/2021 13:31:25 -------------------------------------------------------------------------------- Patient/Caregiver Education Details Patient Name: Date of Service: [name], [name] 11/9/2022andnbsp10:30 A M Medical Record Number: [id number] Patient Account Number: [id number] Date of Birth/Gender: Treating RN: 1951-09-25 (70 y.o. [name] Primary Care Physician: Nicoletta Ba Other Clinician: Referring Physician: Treating  Physician/Extender: Adele Dan in Treatment: 294 Education Assessment Education Provided To: Patient Education Topics Provided Elevated Blood Sugar/ Impact on Healing: Handouts: Elevated Blood Sugars: How Do They Affect Wound Healing Methods: Explain/Verbal Responses: Reinforcements needed Electronic Signature(s) Signed: 09/12/2021 5:29:58 PM By: Shawn Stall RN, BSN Entered By: Shawn Stall on 09/12/2021 13:31:08 -------------------------------------------------------------------------------- Wound Assessment Details Patient Name: Date of Service: [name], Hugo J. 09/12/2021 10:30 A M Medical Record Number: [id number] Patient Account Number: [id number] Date of Birth/Sex: Treating RN: Jan 07, 1951 (70 y.o. [name] Primary Care Miosha Behe: Nicoletta Ba Other Clinician: Referring Emonte Dieujuste: Treating Keeli Roberg/Extender: Adele Dan in Treatment: 294 Wound Status Wound Number: 198 Primary Etiology: Lymphedema Wound Location: Right, Posterior Lower Leg Wound Status: Open Wounding Event: Blister Date Acquired: 08/08/2021 Weeks Of Treatment: 5 Clustered Wound: No Wound Measurements Length: (cm) 1 Width: (cm) 0.9 Depth: (cm) 0.1 Area: (cm) 0.707 Volume: (cm) 0.071 % Reduction in Area: 95.3% % Reduction in Volume: 95.3% Wound Description Classification: Partial Thickness Exudate Amount: Medium Exudate Type: Serous Exudate Color: amber Treatment Notes Wound #198 (Lower Leg) Wound Laterality: Right, Posterior Cleanser Soap and Water Discharge Instruction: May shower and wash wound with dial antibacterial soap and water prior to dressing change. Wound Cleanser Discharge Instruction: Cleanse the wound with wound cleanser prior to applying a clean dressing using gauze sponges, not tissue or cotton balls. Peri-Wound Care Zinc Oxide Ointment 30g tube Discharge Instruction: Apply Zinc Oxide to periwound with each  dressing change Sween Lotion (Moisturizing lotion) Discharge Instruction: Apply moisturizing lotion as directed Topical Primary Dressing KerraCel Ag Gelling Fiber Dressing, 4x5 in (silver alginate) Discharge Instruction: Apply silver alginate to wound bed as instructed Secondary Dressing ABD Pad, 8x10 Discharge Instruction: Apply over primary dressing as directed. Secured With Compression Wrap FourPress (4 layer compression wrap) Discharge Instruction: ****UNNA BOOT FIRST LAYER APPLIED TO UPPER PORTION OF LOWER LEG.*** Compression Stockings Add-Ons Electronic Signature(s) Signed: 09/12/2021 5:29:58 PM By: Shawn Stall RN, BSN Entered By: Shawn Stall on 09/12/2021 12:51:58 -------------------------------------------------------------------------------- Wound Assessment Details Patient Name: Date of Service: [name], Merrik J. 09/12/2021 10:30 A M Medical Record Number: [id number] Patient Account Number: [id number] Date of Birth/Sex: Treating RN: 1951-03-01 (70 y.o. [name] Primary Care Daray Polgar: Nicoletta Ba Other Clinician: Referring Jackalyn Haith: Treating Harrie Cazarez/Extender: Adele Dan in Treatment: 294 Wound Status Wound Number: 199 Primary Etiology: Lymphedema Wound Location: Right T Third oe Wound Status: Open Wounding Event: Gradually Appeared Date Acquired: 08/08/2021 Weeks Of Treatment: 5 Clustered Wound: No Wound Measurements Length: (cm) 1 Width: (cm) 1.2 Depth: (cm) 0.1 Area: (cm) 0.942 Volume: (cm) 0.094 % Reduction in Area: -66.7% % Reduction in Volume: -64.9% Wound Description Classification: Full Thickness Without Exposed Support Structu Exudate  Amount: Medium Exudate Type: Serous Exudate Color: amber res Treatment Notes Wound #199 (Toe Third) Wound Laterality: Right Cleanser Soap and Water Discharge Instruction: May shower and wash wound with dial antibacterial soap and water prior to dressing change. Wound  Cleanser Discharge Instruction: Cleanse the wound with wound cleanser prior to applying a clean dressing using gauze sponges, not tissue or cotton balls. Peri-Wound Care Topical Primary Dressing KerraCel Ag Gelling Fiber Dressing, 2x2 in (silver alginate) Discharge Instruction: Apply silver alginate to wound bed as instructed Secondary Dressing Secured With Conforming Stretch Gauze Bandage, Sterile 2x75 (in/in) Discharge Instruction: Secure with stretch gauze as directed. 73M Medipore H Soft Cloth Surgical T ape, 2x2 (in/yd) Discharge Instruction: Secure dressing with tape as directed. Compression Wrap Compression Stockings Add-Ons Electronic Signature(s) Signed: 09/12/2021 5:29:58 PM By: Shawn Stall RN, BSN Entered By: Shawn Stall on 09/12/2021 12:51:59 -------------------------------------------------------------------------------- Wound Assessment Details Patient Name: Date of Service: [name], Claxton J. 09/12/2021 10:30 A M Medical Record Number: [id number] Patient Account Number: [id number] Date of Birth/Sex: Treating RN: [birth year] (70 y.o. [name] Primary Care Tahirih Lair: Nicoletta Ba Other Clinician: Referring Tavious Griesinger: Treating Talicia Sui/Extender: Adele Dan in Treatment: 294 Wound Status Wound Number: 200 Primary Etiology: Lymphedema Wound Location: Left, Medial Lower Leg Wound Status: Open Wounding Event: Gradually Appeared Date Acquired: 08/20/2021 Weeks Of Treatment: 3 Clustered Wound: No Wound Measurements Length: (cm) 4 Width: (cm) 1.5 Depth: (cm) 0.1 Area: (cm) 4.712 Volume: (cm) 0.471 % Reduction in Area: 0% % Reduction in Volume: 0% Wound Description Classification: Full Thickness Without Exposed Support Structu Exudate Amount: Medium Exudate Type: Serous Exudate Color: amber res Treatment Notes Wound #200 (Lower Leg) Wound Laterality: Left, Medial Cleanser Soap and Water Discharge Instruction: May  shower and wash wound with dial antibacterial soap and water prior to dressing change. Wound Cleanser Discharge Instruction: Cleanse the wound with wound cleanser prior to applying a clean dressing using gauze sponges, not tissue or cotton balls. Peri-Wound Care Zinc Oxide Ointment 30g tube Discharge Instruction: Apply Zinc Oxide to periwound with each dressing change Sween Lotion (Moisturizing lotion) Discharge Instruction: Apply moisturizing lotion as directed Topical Primary Dressing KerraCel Ag Gelling Fiber Dressing, 4x5 in (silver alginate) Discharge Instruction: Apply silver alginate to wound bed as instructed Secondary Dressing ABD Pad, 8x10 Discharge Instruction: Apply over primary dressing as directed. Secured With Compression Wrap FourPress (4 layer compression wrap) Discharge Instruction: ****UNNA BOOT FIRST LAYER APPLIED TO UPPER PORTION OF LOWER LEG.*** Compression Stockings Add-Ons Electronic Signature(s) Signed: 09/12/2021 5:29:58 PM By: Shawn Stall RN, BSN Entered By: Shawn Stall on 09/12/2021 12:51:59 -------------------------------------------------------------------------------- Wound Assessment Details Patient Name: Date of Service: [name], Srihan J. 09/12/2021 10:30 A M Medical Record Number: [id number] Patient Account Number: [id number] Date of Birth/Sex: Treating RN: [birth year] (70 y.o. [name] Primary Care Shalimar Mcclain: Other Clinician: Nicoletta Ba Referring Youssouf Shipley: Treating Heide Brossart/Extender: Adele Dan in Treatment: 294 Wound Status Wound Number: 201 Primary Etiology: Lymphedema Wound Location: Right, Anterior Lower Leg Wound Status: Open Wounding Event: Gradually Appeared Date Acquired: 08/20/2021 Weeks Of Treatment: 3 Clustered Wound: No Wound Measurements Length: (cm) 1 Width: (cm) 0.8 Depth: (cm) 0.1 Area: (cm) 0.628 Volume: (cm) 0.063 % Reduction in Area: 0% % Reduction in Volume: 0% Wound  Description Classification: Full Thickness Without Exposed Support Structu Exudate Amount: Large Exudate Type: Serous Exudate Color: amber res Treatment Notes Wound #201 (Lower Leg) Wound Laterality: Right, Anterior Cleanser Soap and Water Discharge Instruction: May shower and wash wound with  dial antibacterial soap and water prior to dressing change. Wound Cleanser Discharge Instruction: Cleanse the wound with wound cleanser prior to applying a clean dressing using gauze sponges, not tissue or cotton balls. Peri-Wound Care Zinc Oxide Ointment 30g tube Discharge Instruction: Apply Zinc Oxide to periwound with each dressing change Sween Lotion (Moisturizing lotion) Discharge Instruction: Apply moisturizing lotion as directed Topical Primary Dressing KerraCel Ag Gelling Fiber Dressing, 4x5 in (silver alginate) Discharge Instruction: Apply silver alginate to wound bed as instructed Secondary Dressing ABD Pad, 8x10 Discharge Instruction: Apply over primary dressing as directed. Secured With Compression Wrap FourPress (4 layer compression wrap) Discharge Instruction: ****UNNA BOOT FIRST LAYER APPLIED TO UPPER PORTION OF LOWER LEG.*** Compression Stockings Add-Ons Electronic Signature(s) Signed: 09/12/2021 5:29:58 PM By: Shawn Stall RN, BSN Entered By: Shawn Stall on 09/12/2021 12:51:59 -------------------------------------------------------------------------------- Wound Assessment Details Patient Name: Date of Service: [name], Finneas J. 09/12/2021 10:30 A M Medical Record Number: [id number] Patient Account Number: [id number] Date of Birth/Sex: Treating RN: 1951/01/13 (70 y.o. [name] Primary Care Sherial Ebrahim: Nicoletta Ba Other Clinician: Referring Sondos Wolfman: Treating Lajoyce Tamura/Extender: Adele Dan in Treatment: 294 Wound Status Wound Number: 202 Primary Etiology: Lymphedema Wound Location: Right T Fourth oe Wound Status:  Open Wounding Event: Gradually Appeared Date Acquired: 08/20/2021 Weeks Of Treatment: 3 Clustered Wound: No Wound Measurements Length: (cm) 1.5 Width: (cm) 1 Depth: (cm) 0.1 Area: (cm) 1.178 Volume: (cm) 0.118 % Reduction in Area: 0% % Reduction in Volume: 0% Wound Description Classification: Full Thickness Without Exposed Support Structu Exudate Amount: Medium Exudate Type: Serous Exudate Color: amber res Treatment Notes Wound #202 (Toe Fourth) Wound Laterality: Right Cleanser Soap and Water Discharge Instruction: May shower and wash wound with dial antibacterial soap and water prior to dressing change. Wound Cleanser Discharge Instruction: Cleanse the wound with wound cleanser prior to applying a clean dressing using gauze sponges, not tissue or cotton balls. Peri-Wound Care Topical Primary Dressing KerraCel Ag Gelling Fiber Dressing, 2x2 in (silver alginate) Discharge Instruction: Apply silver alginate to wound bed as instructed Secondary Dressing Secured With Conforming Stretch Gauze Bandage, Sterile 2x75 (in/in) Discharge Instruction: Secure with stretch gauze as directed. 73M Medipore H Soft Cloth Surgical T ape, 2x2 (in/yd) Discharge Instruction: Secure dressing with tape as directed. Compression Wrap Compression Stockings Add-Ons Electronic Signature(s) Signed: 09/12/2021 5:29:58 PM By: Shawn Stall RN, BSN Entered By: Shawn Stall on 09/12/2021 12:51:59 -------------------------------------------------------------------------------- Wound Assessment Details Patient Name: Date of Service: [name], Everson J. 09/12/2021 10:30 A M Medical Record Number: [id number] Patient Account Number: [id number] Date of Birth/Sex: Treating RN: 1951-07-31 (70 y.o. [name] Primary Care Trudee Chirino: Nicoletta Ba Other Clinician: Referring Lyrica Mcclarty: Treating Kerisha Goughnour/Extender: Adele Dan in Treatment: 294 Wound Status Wound Number:  203 Primary Etiology: Lymphedema Wound Location: Right, Anterior Ankle Wound Status: Open Wounding Event: Gradually Appeared Date Acquired: 08/20/2021 Weeks Of Treatment: 3 Clustered Wound: No Wound Measurements Length: (cm) 1.5 Width: (cm) 1.5 Depth: (cm) 0.1 Area: (cm) 1.767 Volume: (cm) 0.177 % Reduction in Area: 0% % Reduction in Volume: 0% Wound Description Classification: Full Thickness Without Exposed Support Structur Exudate Amount: Medium Exudate Type: Serosanguineous Exudate Color: red, brown es Treatment Notes Wound #203 (Ankle) Wound Laterality: Right, Anterior Cleanser Soap and Water Discharge Instruction: May shower and wash wound with dial antibacterial soap and water prior to dressing change. Wound Cleanser Discharge Instruction: Cleanse the wound with wound cleanser prior to applying a clean dressing using gauze sponges, not tissue or cotton balls.  Peri-Wound Care Zinc Oxide Ointment 30g tube Discharge Instruction: Apply Zinc Oxide to periwound with each dressing change Sween Lotion (Moisturizing lotion) Discharge Instruction: Apply moisturizing lotion as directed Topical Primary Dressing KerraCel Ag Gelling Fiber Dressing, 4x5 in (silver alginate) Discharge Instruction: Apply silver alginate to wound bed as instructed Secondary Dressing ABD Pad, 8x10 Discharge Instruction: Apply over primary dressing as directed. Secured With Compression Wrap FourPress (4 layer compression wrap) Discharge Instruction: ****UNNA BOOT FIRST LAYER APPLIED TO UPPER PORTION OF LOWER LEG.*** Compression Stockings Add-Ons Electronic Signature(s) Signed: 09/12/2021 5:29:58 PM By: Shawn Stall RN, BSN Entered By: Shawn Stall on 09/12/2021 12:52:00 -------------------------------------------------------------------------------- Vitals Details Patient Name: Date of Service: [name], Almond J. 09/12/2021 10:30 A M Medical Record Number: [id number] Patient Account Number:  [id number] Date of Birth/Sex: Treating RN: [birth year] (70 y.o. [name] Primary Care Doylene Splinter: Nicoletta Ba Other Clinician: Referring Karin Griffith: Treating Dominica Kent/Extender: Adele Dan in Treatment: 294 Vital Signs Time Taken: 10:33 Temperature (F): 97.7 Height (in): 70 Pulse (bpm): 88 Source: Stated Respiratory Rate (breaths/min): 22 Weight (lbs): 380.2 Blood Pressure (mmHg): 155/63 Source: Stated Capillary Blood Glucose (mg/dl): 409 Body Mass Index (BMI): 54.5 Reference Range: 80 - 120 mg / dl Notes glucose per pt report this am Electronic Signature(s) Signed: 09/12/2021 5:03:19 PM By: Zenaida Deed RN, BSN Entered By: Zenaida Deed on 09/12/2021 10:34:38

## 2021-09-14 ENCOUNTER — Other Ambulatory Visit: Payer: Self-pay

## 2021-09-14 ENCOUNTER — Telehealth: Payer: Self-pay

## 2021-09-14 ENCOUNTER — Other Ambulatory Visit: Payer: Self-pay | Admitting: Family Medicine

## 2021-09-14 ENCOUNTER — Ambulatory Visit (INDEPENDENT_AMBULATORY_CARE_PROVIDER_SITE_OTHER): Payer: Medicare Other

## 2021-09-14 ENCOUNTER — Encounter: Payer: Self-pay | Admitting: Family Medicine

## 2021-09-14 DIAGNOSIS — N183 Chronic kidney disease, stage 3 unspecified: Secondary | ICD-10-CM

## 2021-09-14 LAB — BASIC METABOLIC PANEL
BUN: 26 mg/dL — ABNORMAL HIGH (ref 6–23)
CO2: 27 mEq/L (ref 19–32)
Calcium: 8.5 mg/dL (ref 8.4–10.5)
Chloride: 100 mEq/L (ref 96–112)
Creatinine, Ser: 1.66 mg/dL — ABNORMAL HIGH (ref 0.40–1.50)
GFR: 41.53 mL/min — ABNORMAL LOW (ref >60.00)
Glucose, Bld: 124 mg/dL — ABNORMAL HIGH (ref 70–99)
Potassium: 4.7 mEq/L (ref 3.5–5.1)
Sodium: 139 mEq/L (ref 135–145)

## 2021-09-14 MED ORDER — ISOSORBIDE MONONITRATE ER 30 MG PO TB24: 30.0000 mg | ORAL_TABLET | Freq: Every day | ORAL | 0 refills | Status: DC

## 2021-09-14 MED ORDER — DILTIAZEM HCL ER BEADS 120 MG PO CP24: 120.0000 mg | ORAL_CAPSULE | Freq: Every day | ORAL | 3 refills | Status: AC

## 2021-09-14 NOTE — Telephone Encounter (Signed)
Pt brought recent log of BP readings for review. Placed on provider desk, please review and advise

## 2021-09-14 NOTE — Addendum Note (Signed)
Addended by: Emi Holes D on: 09/14/2021 04:20 PM   Modules accepted: Orders

## 2021-09-14 NOTE — Telephone Encounter (Signed)
Diltiazem erx'd

## 2021-09-14 NOTE — Telephone Encounter (Signed)
Pt was here for lab visit and advised refill sent. Voiced understanding

## 2021-09-14 NOTE — Telephone Encounter (Signed)
Spoke with pt regarding recommendations; rx sent in and o/v scheduled.

## 2021-09-14 NOTE — Telephone Encounter (Signed)
Reviewed home bp's for the last 2 wks-->180/90 avg, HR avg 60s. Increase isosorbide mononitrate to 30mg  daily.  Pls do rx for imdur 30mg  tabs, 1 tab po qd, #30, no RF (take 3 of the 10's until out, then fill rx for 30mg  tabs).o/v with me in7-10d if he doesn't already have one arranged.

## 2021-09-17 DIAGNOSIS — L97511 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 of skin: Secondary | ICD-10-CM | POA: Diagnosis not present

## 2021-09-17 DIAGNOSIS — B351 Tinea unguium: Secondary | ICD-10-CM | POA: Diagnosis not present

## 2021-09-17 DIAGNOSIS — Z794 Long term (current) use of insulin: Secondary | ICD-10-CM | POA: Diagnosis not present

## 2021-09-17 DIAGNOSIS — E1142 Type 2 diabetes mellitus with diabetic polyneuropathy: Secondary | ICD-10-CM | POA: Diagnosis not present

## 2021-09-17 DIAGNOSIS — L97521 Non-pressure chronic ulcer of other 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 of skin: Secondary | ICD-10-CM | POA: Diagnosis not present

## 2021-09-18 ENCOUNTER — Other Ambulatory Visit: Payer: Self-pay

## 2021-09-19 ENCOUNTER — Encounter: Payer: Self-pay | Admitting: Family Medicine

## 2021-09-19 ENCOUNTER — Other Ambulatory Visit: Payer: Self-pay

## 2021-09-19 ENCOUNTER — Ambulatory Visit (INDEPENDENT_AMBULATORY_CARE_PROVIDER_SITE_OTHER): Payer: Medicare Other | Admitting: Family Medicine

## 2021-09-19 ENCOUNTER — Encounter (HOSPITAL_BASED_OUTPATIENT_CLINIC_OR_DEPARTMENT_OTHER): Payer: Medicare Other | Admitting: Physician Assistant

## 2021-09-19 VITALS — BP 124/60 | HR 65 | Temp 97.6°F | Resp 16 | Ht 70.0 in | Wt 395.0 lb

## 2021-09-19 DIAGNOSIS — N183 Chronic kidney disease, stage 3 unspecified: Secondary | ICD-10-CM | POA: Diagnosis not present

## 2021-09-19 DIAGNOSIS — F331 Major depressive disorder, recurrent, moderate: Secondary | ICD-10-CM

## 2021-09-19 DIAGNOSIS — E1151 Type 2 diabetes mellitus with diabetic peripheral angiopathy without gangrene: Secondary | ICD-10-CM | POA: Diagnosis not present

## 2021-09-19 DIAGNOSIS — I890 Lymphedema, not elsewhere classified: Secondary | ICD-10-CM | POA: Diagnosis not present

## 2021-09-19 DIAGNOSIS — L97829 Non-pressure chronic ulcer of other part of left lower leg with unspecified severity: Secondary | ICD-10-CM | POA: Diagnosis not present

## 2021-09-19 DIAGNOSIS — L97322 Non-pressure chronic ulcer of left ankle with fat layer exposed: Secondary | ICD-10-CM | POA: Diagnosis not present

## 2021-09-19 DIAGNOSIS — L97521 Non-pressure chronic ulcer of other 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 of skin: Secondary | ICD-10-CM | POA: Diagnosis not present

## 2021-09-19 DIAGNOSIS — L97519 Non-pressure chronic ulcer of other part of right foot with unspecified severity: Secondary | ICD-10-CM | POA: Diagnosis not present

## 2021-09-19 DIAGNOSIS — L97812 Non-pressure chronic ulcer of other part of right lower leg with fat layer exposed: Secondary | ICD-10-CM | POA: Diagnosis not present

## 2021-09-19 DIAGNOSIS — I10 Essential (primary) hypertension: Secondary | ICD-10-CM

## 2021-09-19 DIAGNOSIS — F4323 Adjustment disorder with mixed anxiety and depressed mood: Secondary | ICD-10-CM

## 2021-09-19 LAB — BASIC METABOLIC PANEL
BUN: 25 mg/dL — ABNORMAL HIGH (ref 6–23)
CO2: 28 mEq/L (ref 19–32)
Calcium: 8.7 mg/dL (ref 8.4–10.5)
Chloride: 100 mEq/L (ref 96–112)
Creatinine, Ser: 1.55 mg/dL — ABNORMAL HIGH (ref 0.40–1.50)
GFR: 45.08 mL/min — ABNORMAL LOW (ref >60.00)
Glucose, Bld: 144 mg/dL — ABNORMAL HIGH (ref 70–99)
Potassium: 4.9 mEq/L (ref 3.5–5.1)
Sodium: 137 mEq/L (ref 135–145)

## 2021-09-19 LAB — TSH: TSH: 6.21 u[IU]/mL — ABNORMAL HIGH (ref 0.35–5.50)

## 2021-09-19 MED ORDER — VALSARTAN 320 MG PO TABS: 320.0000 mg | ORAL_TABLET | Freq: Every day | ORAL | 3 refills | Status: DC

## 2021-09-19 NOTE — Progress Notes (Addendum)
HILL, MACKIE ([id number]) Visit Report for 09/19/2021 Chief Complaint Document Details Patient Name: Date of Service: CO CARLESTER, KASPAREK 09/19/2021 10:30 A M Medical Record Number: [id number] Patient Account Number: [id number] Date of Birth/Sex: Treating RN: Dec 25, 1950 (70 y.o. 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 09/19/2021 10:30 A M Medical Record Number: [id number] Patient Account Number: [id number] Date of Birth/Sex: Treating RN: 10-Jan-1951 (70 y.o. [name] Primary Care Provider: Nicoletta Ba Other Clinician: Referring Provider: Treating Provider/Extender: Adele Dan in Treatment: 295 Debridement Performed for Assessment: Wound #[id number] Left,Anterior Ankle Performed By: Physician Lenda Kelp, PA Debridement Type: Debridement Level of Consciousness (Pre-procedure): Awake and Alert Pre-procedure Verification/Time Out Yes - 11:45 Taken: Start Time: 11:46 Pain Control: Other : Benzocaine 20% T Area Debrided (L x W): otal 1 (cm) x 1.4 (cm) = 1.4 (cm) Viable, Non-Viable, Slough, Subcutaneous, Skin: Dermis , Skin: Epidermis, Fibrin/Exudate, Slough, Hyper- Tissue and other material debrided: granulation Level: Skin/Subcutaneous Tissue Debridement Description: Excisional Instrument: Curette Bleeding: Moderate Hemostasis Achieved: Silver Nitrate End Time: 11:52 Procedural Pain: 0 Post Procedural Pain: 0 Response  to Treatment: Procedure was tolerated well Level of Consciousness (Post- Awake and Alert procedure): Post Debridement Measurements of Total Wound Length: (cm) 1 Width: (cm) 1.4 Depth: (cm) 0.2 Volume: (cm) 0.22 Character of Wound/Ulcer Post Debridement: Improved Post Procedure Diagnosis Same as Pre-procedure Electronic Signature(s) Signed: 09/19/2021 4:59:49 PM By: Lenda Kelp PA-C Signed: 09/19/2021 5:43:37 PM By: Shawn Stall RN, BSN Entered By: Shawn Stall on 09/19/2021 11:53:08 -------------------------------------------------------------------------------- HPI Details Patient Name: Date of Service: [name], Naif J. 09/19/2021 10:30 A M Medical Record Number: [id number] Patient Account Number: [id number] Date of Birth/Sex: Treating RN: Jul 10, 1951 (70 y.o. [name] Primary Care Provider: Nicoletta Ba Other Clinician: Referring Provider: Treating Provider/Extender: Adele Dan in Treatment: 295 History of Present Illness HPI Description: Referred by PCP for consultation. Patient has long standing history of BLE venous stasis, no prior ulcerations. At beginning of month, developed cellulitis and weeping. Received IM Rocephin followed by Keflex and resolved. Wears compression stocking, appr 6 months old. Not sure strength. No present drainage. 01/22/16 this is a patient who is a type II diabetic on insulin. 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 He tells me he religiously wears pressure stockings of uncertain strength. He was here with weeping edema about 8 months ago but did not have an open wound. Roughly a month ago he had a reopening on his bilateral legs. He is been using bandages and Neosporin. He does not complain of pain. 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 Last BUN/creatinine I  have is from 11/20/15 at 13 and 1.0 respectively 01/29/16; patient arrives today having tolerated the Profore wrap. He brought in his stockings and these are 18 mmHg stockings he bought from [place]. The compression here is likely inadequate. He does not complain of pain or excessive drainage she has no systemic symptoms. The wound on the right looks improved as does the  one on the left although one on the left is more substantial with still tissue at risk below the actual wound area on the bilateral posterior calf 02/05/16; patient arrives with poor edema control. He states that we did put a 4 layer compression on it last week. No weight appear 5 this. 02/12/16; the area on the posterior right Has healed. The left Has a substantial wound that has necrotic surface eschar that requires a debridement with a curette. 02/16/16;the patient called or a Nurse visit secondary to increased swelling. He had been in earlier in the week with his right leg healed. He was transitioned to is on pressure stocking on the right leg with the only open wound on the left, a substantial area on the left posterior calf. Note he has a history of severe lower extremity edema, he has a history of chronic atrial fibrillation but not heart failure per my notes but I'll need to research this. He is not complaining of chest pain shortness of breath or orthopnea. The intake nurse noted blisters on the previously closed right leg 02/19/16; this is the patient's regular visit day. I see him on Friday with escalating edema new wounds on the right leg and clear signs of at least right ventricular heart failure. I increased his Lasix to 40 twice a day. He is returning currently in follow-up. States he is noticed a decrease in that the edema 02/26/16 patient's legs have much less edema. There is nothing really open on the right leg. The left leg has improved condition of the large superficial wound on the posterior left leg 03/04/16; edema  control is very much better. The patient's right leg wounds have healed. On the left leg he continues to have severe venous inflammation on the posterior aspect of the left leg. There is no tenderness and I don't think any of this is cellulitis. 03/11/16; patient's right leg is married healed and he is in his own stocking. The patient's left leg has deteriorated somewhat. There is a lot of erythema around the wound on the posterior left leg. There is also a significant rim of erythema posteriorly just above where the wrap would've ended there is a new wound in this location and a lot of tenderness. Can't rule out cellulitis in this area. 03/15/16; patient's right leg remains healed and he is in his own stocking. The patient's left leg is much better than last review. His major wound on the posterior aspect of his left Is almost fully epithelialized. He has 3 small injuries from the wraps. Really. Erythema seems a lot better on antibiotics 03/18/16; right leg remains healed and he is in his own stocking. The patient's left leg is much better. The area on the posterior aspect of the left calf is fully epithelialized. His 3 small injuries which were wrap injuries on the left are improved only one seems still open his erythema has resolved 03/25/16; patient's right leg remains healed and he is in his own stocking. There is no open area today on the left leg posterior leg is completely closed up. His wrap injuries at the superior aspect of his leg are also resolved. He looks as though he has some irritation on the dorsal ankle but this is fully epithelialized without evidence of infection. 03/28/16; we discharged this patient on Monday. Transitioned him into his own stocking. There were problems almost immediately with uncontrolled swelling weeping edema multiple some of which have opened. He does not feel systemically unwell in particular  no chest pain no shortness of breath and he does not feel 04/08/16; the  edema is under better control with the Profore light wrap but he still has pitting edema. There is one large wound anteriorly 2 on the medial aspect of his left leg and 3 small areas on the superior posterior calf. Drainage is not excessive he is tolerating a Profore light well 04/15/16; put a Profore wrap on him last week. This is controlled is edema however he had a lot of pain on his left anterior foot most of his wounds are healed 04/22/16 once again the patient has denuded areas on the left anterior foot which he states are because his wrap slips up word. He saw his primary physician today is on Lasix 40 twice a day and states that he his weight is down 20 pounds over the last 3 months. 04/29/16: Much improved. left anterior foot much improved. He is now on Lasix 80 mg per day. Much improved edema control 05/06/16; I was hoping to be able to discharge him today however once again he has blisters at a low level of where the compression was placed last week mostly on his left lateral but also his left medial leg and a small area on the anterior part of the left foot. 05/09/16; apparently the patient went home after his appointment on 7/4 later in the evening developing pain in his upper medial thigh together with subjective fever and chills although his temperature was not taken. The pain was so intense he felt he would probably have to call 911. However he then remembered that he had leftover doxycycline from a previous round of antibiotics and took these. By the next morning he felt a lot better. He called and spoke to one of our nurses and I approved doxycycline over the phone thinking that this was in relation to the wounds we had previously seen although they were definitely were not. The patient feels a lot better old fever no chills he is still working. Blood sugars are reasonably controlled 05/13/16; patient is back in for review of his cellulitis on his anterior medial upper thigh. He is taking  doxycycline this is a lot better. Culture I did of the nodular area on the dorsal aspect of his foot grew MRSA this also looks a lot better. 05/20/16; the patient is cellulitis on the medial upper thigh has resolved. All of his wound areas including the left anterior foot, areas on the medial aspect of the left calf and the lateral aspect of the calf at all resolved. He has a new blister on the left dorsal foot at the level of the fourth toe this was excised. No evidence of infection 05/27/16; patient continues to complain weeping edema. He has new blisterlike wounds on the left anterior lateral and posterior lateral calf at the top of his wrap levels. The area on his left anterior foot appears better. He is not complaining of fever, pain or pruritus in his feet. 05/30/16; the patient's blisters on his left anterior leg posterior calf all look improved. He did not increase the Lasix 100 mg as I suggested because he was going to run out of his 40 mg tablets. He is still having weeping edema of his toes 06/03/16; I renewed his Lasix at 80 mg once a day as he was about to run out when I last saw him. He is on 80 mg of Lasix now. I have asked him to cut down on the excessive  amount of water he was drinking and asked him to drink according to his thirst mechanisms 06/12/2016 -- was seen 2 days ago and was supposed to wear his compression stockings at home but he is developed lymphedema and superficial blisters on the left lower extremity and hence came in for a review 06/24/16; the remaining wound is on his left anterior leg. He still has edema coming from between his toes. There is lymphedema here however his edema is generally better than when I last saw this. He has a history of atrial fibrillation but does not have a known history of congestive heart failure nevertheless I think he probably has this at least on a diastolic basis. 07/01/16 I reviewed his echocardiogram from January 2017. This was essentially  normal. He did not have LVH, EF of 55-60%. His right ventricular function was normal although he did have trivial tricuspid and pulmonic regurgitation. This is not audible on exam however. I increased his Lasix to do massive edema in his legs well above his knees I think in early July. He was also drinking an excessive amount of water at the time. 07/15/16; missed his appointment last week because of the Labor Day holiday on Monday. He could not get another appointment later in the week. Started to feel the wrap digging in superiorly so we remove the top half and the bottom half of his wrap. He has extensive erythema and blistering superiorly in the left leg. Very tender. Very swollen. Edema in his foot with leaking edema fluid. He has not been systemically unwell 07/22/16; the area on the left leg laterally required some debridement. The medial wounds look more stable. His wrap injury wounds appear to have healed. Edema and his foot is better, weeping edema is also better. He tells me he is meeting with the supplier of the external compression pumps at work 08/05/16; the patient was on vacation last week in [facility]. His wrap is been on for an extended period of time. Also over the weekend he developed an extensive area of tender erythema across his anterior medial thigh. He took to doxycycline yesterday that he had leftover from a previous prescription. The patient complains of weeping edema coming out of his toes 08/08/16; I saw this patient on 10/2. He was tender across his anterior thigh. I put him on doxycycline. He returns today in follow-up. He does not have any open wounds on his lower leg, he still has edema weeping into his toes. 08/12/16; patient was seen back urgently today to follow-up for his extensive left thigh cellulitis/erysipelas. He comes back with a lot less swelling and erythema pain is much better. I believe I gave him Augmentin and Cipro. His wrap was cut down as he stated a  roll down his legs. He developed blistering above the level of the wrap that remained. He has 2 open blisters and 1 intact. 08/19/16; patient is been doing his primary doctor who is increased his Lasix from 40-80 once a day or 80 already has less edema. Cellulitis has remained improved in the left thigh. 2 open areas on the posterior left calf 08/26/16; he returns today having new open blisters on the anterior part of his left leg. He has his compression pumps but is not yet been shown how to use some vital representative from the supplier. 09/02/16 patient returns today with no open wounds on the left leg. Some maceration in his plantar toes 09/10/2016 -- Dr. Leanord Hawking had recently discharged him on 09/02/2016  and he has come right back with redness swelling and some open ulcers on his left lower extremity. He says this was caused by trying to apply his compression stockings and he's been unable to use this and has not been able to use his lymphedema pumps. He had some doxycycline leftover and he has started on this a few days ago. 09/16/16; there are no open wounds on his leg on the left and no evidence of cellulitis. 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 He does not complain of symptoms here. I am not clear use using his external compression pumps. 09/23/16; I have not seen this patient in 2 weeks. He canceled his appointment 10 days ago as he was going on vacation. He tells me that on Monday he noticed a large area on his posterior left leg which is been draining copiously and is reopened into a large wound. He is been using ABDs and the external part of his juxtalite, according to our nurse this was not on properly. 10/07/16; Still a substantial area on the posterior left leg. Using silver alginate 10/14/16; in general better although there is still open area which looks healthy. Still using silver alginate. He reminds me that this happen  before he left for [facility]. T oday while he was showering in the morning. He had been using his juxtalite's 10/21/16; the area on his posterior left leg is fully epithelialized. However he arrives today with a large area of tender erythema in his medial and posterior left thigh just above the knee. I have marked the area. Once again he is reluctant to consider hospitalization. I treated him with oral antibiotics in the past for a similar situation with resolution I think with doxycycline however this area it seems more extensive to me. He is not complaining of fever but does have chills and says states he is thirsty. His blood sugar today was in the 140s at home 10/25/16 the area on his posterior left leg is fully epithelialized although there is still some weeping edema. The large area of tenderness and erythema in his medial and posterior left thigh is a lot less tender although there is still a lot of swelling in this thigh. He states he feels a lot better. He is on doxycycline and Augmentin that I started last week. This will continued until Tuesday, December 26. I have ordered a duplex ultrasound of the left thigh rule out DVT whether there is an abscess something that would need to be drained I would also like to know. 11/01/16; he still has weeping edema from a not fully epithelialized area on his left posterior calf. Most of the rest of this looks a lot better. He has completed his antibiotics. His thigh is a lot better. Duplex ultrasound did not show a DVT in the thigh 11/08/16; he comes in today with more Denuded surface epithelium from the posterior aspect of his calf. There is no real evidence of cellulitis. The superior aspect of his wrap appears to have put quite an indentation in his leg just below the knee and this may have contributed. He does not complain of pain or fever. We have been using silver alginate as the primary dressing. The area of cellulitis in the right thigh has  totally resolved. He has been using his compression stockings once a week 11/15/16; the patient arrives today with more loss of epithelium from the posterior aspect of his left  calf. He now has a fairly substantial wound in this area. The reason behind this deterioration isn't exactly clear although his edema is not well controlled. He states he feels he is generally more swollen systemically. He is not complaining of chest pain shortness of breath fever. T me he has an appointment with his primary physician in early February. He is on 80 mg of oral ells Lasix a day. He claims compliance with the external compression pumps. He is not having any pain in his legs similar to what he has with his recurrent cellulitis 11/22/16; the patient arrives a follow-up of his large area on his left lateral calf. This looks somewhat better today. He came in earlier in the week for a dressing change since I saw him a week ago. He is not complaining of any pain no shortness of breath no chest pain 11/28/16; the patient arrives for follow-up of his large area on the left lateral calf this does not look better. In fact it is larger weeping edema. The surface of the wound does not look too bad. We have been using silver alginate although I'm not certain that this is a dressing issue. 12/05/16; again the patient follows up for a large wound on the left lateral and left posterior calf this does not look better. There continues to be weeping edema necrotic surface tissue. More worrisome than this once again there is erythema below the wound involving the distal Achilles and heel suggestive of cellulitis. He is on his feet working most of the day of this is not going well. We are changing his dressing twice a week to facilitate the drainage. 12/12/16; not much change in the overall dimensions of the large area on the left posterior calf. This is very inflamed looking. I gave him an. Doxycycline last week does not really seem to have  helped. He found the wrap very painful indeed it seems to of dog into his legs superiorly and perhaps around the heel. He came in early today because the drainage had soaked through his dressings. 12/19/16- patient arrives for follow-up evaluation of his left lower extremity ulcers. He states that he is using his lymphedema pumps once daily when there is "no drainage". He admits to not using his lipedema pumps while under current treatment. His blood sugars have been consistently between 150-200. 12/26/16; the patient is not using his compression pumps at home because of the wetness on his feet. I've advised him that I think it's important for him to use this daily. He finds his feet too wet, he can put a plastic bag over his legs while he is in the pumps. Otherwise I think will be in a vicious circle. We are using silver alginate to the major area on his left posterior calf 01/02/17; the patient's posterior left leg has further of all into 3 open wounds. All of them covered with a necrotic surface. He claims to be using his compression pumps once a day. His edema control is marginal. Continue with silver alginate 01/10/17; the patient's left posterior leg actually looks somewhat better. There is less edema, less erythema. Still has 3 open areas covered with a necrotic surface requiring debridement. He claims to be using his compression pumps once a day his edema control is better 01/17/17; the patient's left posterior calf look better last week when I saw him and his wrap was changed 2 days ago. He has noted increasing pain in the left heel and arrives today with much  larger wounds extensive erythema extending down into the entire heel area especially tender medially. He is not systemically unwell CBGs have been controlled no fever. Our intake nurse showed me limegreen drainage on his AVD pads. 01/24/17; his usual this patient responds nicely to antibiotics last week giving him Levaquin for presumed  Pseudomonas. The whole entire posterior part of his leg is much better much less inflamed and in the case of his Achilles heel area much less tender. He has also had some epithelialization posteriorly there are still open areas here and still draining but overall considerably better 01/31/17- He has continue to tolerate the compression wraps. he states that he continues to use the lymphedema pumps daily, and can increase to twice daily on the weekends. He is voicing no complaints or concerns regarding his LLE ulcers 02/07/17-he is here for follow-up evaluation. He states that he noted some erythema to the left medial and anterior thigh, which he states is new as of yesterday. He is concerned about recurrent cellulitis. He states his blood sugars have been slightly elevated, this morning in the 180s 02/14/17; he is here for follow-up evaluation. When he was last here there was erythema superiorly from his posterior wound in his anterior thigh. He was prescribed Levaquin however a culture of the wound surface grew MRSA over the phone I changed him to doxycycline on Monday and things seem to be a lot better. 02/24/17; patient missed his appointment on Friday therefore we changed his nurse visit into a physician visit today. Still using silver alginate on the large area of the posterior left thigh. He isn't new area on the dorsal left second toe 03/03/17; actually better today although he admits he has not used his external compression pumps in the last 2 days or so because of work responsibilities over the weekend. 03/10/17; continued improvement. External compression pumps once a day almost all of his wounds have closed on the posterior left calf. Better edema control 03/17/17; in general improved. He still has 3 small open areas on the lateral aspect of his left leg however most of the area on the posterior part of his leg is epithelialized. He has better edema control. He has an ABD pad under his stocking on  the right anterior lower leg although he did not let us look at that today. 03/24/17; patient arrives back in clinic today with no open areas however there are areas on the posterior left calf and anterior left calf that are less than 100% epithelialized. His edema is well controlled in the left lower leg. There is some pitting edema probably lymphedema in the left upper thigh. He uses compression pumps at home once per day. I tried to get him to do this twice a day although he is very reticent. 04/01/2017 -- for the last 2 days he's had significant redness, tenderness and weeping and came in for an urgent visit today. 04/07/17; patient still has 6 more days of doxycycline. He was seen by Dr. Meyer Russel last Wednesday for cellulitis involving the posterior aspect, lateral aspect of his Involving his heel. For the most part he is better there is less erythema and less weeping. He has been on his feet for 12 hours 2 over the weekend. Using his compression pumps once a day 04/14/17 arrives today with continued improvement. Only one area on the posterior left calf that is not fully epithelialized. He has intense bilateral venous inflammation associated with his chronic venous insufficiency disease and secondary lymphedema. We have  been using silver alginate to the left posterior calf wound In passing he tells [place] today that the right leg but we have not seen in quite some time has an open area on it but he doesn't want [place] to look at this today states he will show this to [place] next week. 04/21/17; there is no open area on his left leg although he still reports some weeping edema. He showed [place] his right leg today which is the first time we've seen this leg in a long time. He has a large area of open wound on the right leg anteriorly healthy granulation. Quite a bit of swelling in the right leg and some degree of venous inflammation. He told us about the right leg in passing last week but states that deterioration in  the right leg really only happened over the weekend 04/28/17; there is no open area on the left leg although there is an irritated part on the posterior which is like a wrap injury. The wound on the right leg which was new from last week at least to [place] is a lot better. 05/05/17; still no open area on the left leg. Patient is using his new compression stocking which seems to be doing a good job of controlling the edema. He states he is using his compression pumps once per day. The right leg still has an open wound although it is better in terms of surface area. Required debridement. A lot of pain in the posterior right Achilles marked tenderness. Usually this type of presentation this patient gives concern for an active cellulitis 05/12/17; patient arrives today with his major wound from last week on the right lateral leg somewhat better. Still requiring debridement. He was using his compression stocking on the left leg however that is reopened with superficial wounds anteriorly he did not have an open wound on this leg previously. He is still using his juxta light's once daily at night. He cannot find the time to do this in the morning as he has to be at work by 7 AM 05/19/17; right lateral leg wound looks improved. No debridement required. The concerning area is on the left posterior leg which appears to almost have a subcutaneous hemorrhagic component to it. We've been using silver alginate to all the wounds 05/26/17; the right lateral leg wound continues to look improved. However the area on the left posterior calf is a tightly adherent surface. [place] using silver alginate. Because of the weeping edema in his legs there is very little good alternatives. 06/02/17; the patient left here last week looking quite good. Major wound on the left posterior calf and a small one on the right lateral calf. Both of these look satisfactory. He tells me that by Wednesday he had noted increased pain in the left leg and  drainage. He called on Thursday and Friday to get an appointment here but we were blocked. He did not go to urgent care or his primary physician. He thinks he had a fever on Thursday but did not actually take his temperature. He has not been using his compression pumps on the left leg because of pain. I advised him to go to the emergency room today for IV antibiotics for stents of left leg cellulitis but he has refused I have asked him to take 2 days off work to keep his leg elevated and he has refused this as well. In view of this I'm going to call him and Augmentin and doxycycline. 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 However after a detailed discussion regarding the risk benefits and alternatives he refuses to get admitted to the hospital. With no other recourse I will continue him on oral antibiotics as before and hopefully he'll have his infectious disease consultation this week. 06/16/2017 -- the patient was seen today by the nurse practitioner at infectious disease Ms. Dixon. 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 He has also been advise Lotrimin cream locally. she also had wise IV antibiotics for his condition if it flares up 06/23/17; patient arrives today with drainage bilaterally although the remaining wound on the left posterior calf after cleaning up today "highlighter yellow drainage" did not look too bad. Unfortunately he has had breakdown on the right anterior leg [previously this leg had not been open and he is using a black stocking] he went to see  infectious disease and is been put on clindamycin 150 mg daily, I did not verify the dose although I'm not familiar with using clindamycin in this dosing range, perhaps for prophylaxisoo 06/27/17; I brought this patient back today to follow-up on the wound deterioration on the right lower leg together with surrounding cellulitis. I started him on doxycycline 4 days ago. This area looks better however he comes in today with intense cellulitis on the medial part of his left thigh. This is not have a wound in this area. Extremely tender. We've been using silver alginate to the wounds on the right lower leg left lower leg with bilateral 4 layer compression he is using his external compression pumps once a day 07/04/17; patient's left medial thigh cellulitis looks better. He has not been using his compression pumps as his insert said it was contraindicated with cellulitis. His right leg continues to make improvements all the wounds are still open. We only have one remaining wound on the left posterior calf. Using silver alginate to all open areas. He is on doxycycline which I started a week ago and should be finishing I gave him Augmentin after Thursday's visit for the severe cellulitis on the left medial thigh which fortunately looks better 07/14/17; the patient's left medial thigh cellulitis has resolved. The cellulitis in his right lower calf on the right also looks better. All of his wounds are stable to improved we've been using silver alginate he has completed the antibiotics I have given him. He has clindamycin 150 mg once a day prescribed by infectious disease for prophylaxis, I've advised him to start this now. We have been using bilateral Unna boots over silver alginate to the wound areas 07/21/17; the patient is been to see infectious disease who noted his recurrent problems with cellulitis. He was not able to tolerate prophylactic clindamycin therefore he is on amoxicillin 500 twice a day. He also  had a second daily dose of Lasix added By Dr. Oneta Rack but he is not taking this. Nor is he being completely compliant with his compression pumps a especially not this week. He has 2 remaining wounds one on the right posterior lateral lower leg and one  on the left posterior medial lower leg. 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 The patient has Unna boots bilaterally. 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 He has bilateral Unna boots for compression and silver alginate to his wounds. Arrives today with his legs looking as good as I have seen him in quite some time. Not surprisingly his wounds look better as well with improvement on the right lateral leg venous insufficiency wound and also the left medial leg. He is still using the compression pumps once a day 08/11/17; both legs appear to be doing better wounds on the right lateral and left medial legs look better. Skin on the right leg quite good. He is been using silver alginate as the primary dressing. I'm going to use Anasept gel calcium alginate and maintain all the secondary dressings 08/18/17; the patient continues to actually do quite well. The area on his right lateral leg is just about closed the left medial also looks better although it is still moist in this area. His edema is well controlled we have been using Anasept gel with calcium alginate and the usual secondary dressings, 4 layer compression and once daily use of his compression pumps "always been able to manage 09/01/17; the patient continues to do reasonably well in spite of his trip to [place]. The area on the right lateral leg is epithelialized. Left is much better but still open. He has more edema and more chronic erythema on the left leg [venous  inflammation] 09/08/17; he arrives today with no open wound on the right lateral leg and decently controlled edema. Unfortunately his left leg is not nearly as in his good situation as last week.he apparently had increasing edema starting on Saturday. He edema soaked through into his foot so used a plastic bag to walk around his home. The area on the medial right leg which was his open area is about the same however he has lost surface epithelium on the left lateral which is new and he has significant pain in the Achilles area of the left foot. He is already on amoxicillin chronically for prophylaxis of cellulitis in the left leg 09/15/17; he is completed a week of doxycycline and the cellulitis in the left posterior leg and Achilles area is as usual improved. He still has a lot of edema and fluid soaking through his dressings. There is no open wound on the right leg. He saw infectious disease NP today 09/22/17;As usual 1 we transition him from our compression wraps to his stockings things did not go well. He has several small open areas on the right leg. He states this was caused by the compression wrap on his skin although he did not wear this with the stockings over them. He has several superficial areas on the left leg medially laterally posteriorly. He does not have any evidence of active cellulitis especially involving the left Achilles The patient is traveling from [facility] Saturday going to [facility]. He states he isn't attempting to get an appointment with a heel objects wound center there to change his dressings. I am not completely certain whether this will work 10/06/17; the patient came in on Friday for a nurse visit and the nurse reported that his legs actually look quite good. He arrives in clinic  today for his regular follow-up visit. He has a new wound on his left third toe over the PIP probably caused by friction with his footwear. He has small areas on the left leg and a very  superficial but epithelialized area on the right anterior lateral lower leg. Other than that his legs look as good as I've seen him in quite some time. We have been using silver alginate Review of systems; no chest pain no shortness of breath other than this a 10 point review of systems negative 10/20/17; seen by Dr. Meyer Russel last week. He had taken some antibiotics [doxycycline] that he had left over. Dr. Meyer Russel thought he had candida infection and declined to give him further antibiotics. He has a small wound remaining on the right lateral leg several areas on the left leg including a larger area on the left posterior several left medial and anterior and a small wound on the left lateral. The area on the left dorsal third toe looks a lot better. ROS; Gen.; no fever, respiratory no cough no sputum Cardiac no chest pain other than this 10 point review of system is negative 10/30/17; patient arrives today having fallen in the bathtub 3 days ago. It took him a while to get up. He has pain and maceration in the wounds on his left leg which have deteriorated. He has not been using his pumps he also has some maceration on the right lateral leg. 11/03/17; patient continues to have weeping edema especially in the left leg. This saturates his dressings which were just put on on 12/27. As usual the doxycycline seems to take care of the cellulitis on his lower leg. He is not complaining of fever, chills, or other systemic symptoms. He states his leg feels a lot better on the doxycycline I gave him empirically. He also apparently gets injections at his primary doctor's officeo Rocephin for cellulitis prophylaxis. I didn't ask him about his compression pump compliance today I think that's probably marginal. Arrives in the clinic with all of his dressings primary and secondary macerated full of fluid and he has bilateral edema 11/10/17; the patient's right leg looks some better although there is still a cluster of  wounds on the right lateral. The left leg is inflamed with almost circumferential skin loss medially to laterally although we are still maintaining anteriorly. He does not have overt cellulitis there is a lot of drainage. He is not using compression pumps. We have been using silver alginate to the wound areas, there are not a lot of options here 11/17/17; the patient's right leg continues to be stable although there is still open wounds, better than last week. The inflammation in the left leg is better. Still loss of surface layer epithelium especially posteriorly. There is no overt cellulitis in the amount of edema and his left leg is really quite good, tells me he is using his compression pumps once a day. 11/24/17; patient's right leg has a small superficial wound laterally this continues to improve. The inflammation in the left leg is still improving however we have continuous surface layer epithelial loss posteriorly. There is no overt cellulitis in the amount of edema in both legs is really quite good. He states he is using his compression pumps on the left leg once a day for 5 out of 7 days 12/01/17; very small superficial areas on the right lateral leg continue to improve. Edema control in both legs is better today. He has continued loss of surface epithelialization  and left posterior calf although I think this is better. We have been using silver alginate with large number of absorptive secondary dressings 4 layer on the left Unna boot on the right at his request. He tells me he is using his compression pumps once a day 12/08/17; he has no open area on the right leg is edema control is good here. On the left leg however he has marked erythema and tenderness breakdown of skin. He has what appears to be a wrap injury just distal to the popliteal fossa. This is the pattern of his recurrent cellulitis area and he apparently received penicillin at his primary physician's office really worked in my view  but usually response to doxycycline given it to him several times in the past 12/15/17; the patient had already deteriorated last Friday when he came in for his nurse check. There was swelling erythema and breakdown in the right leg. He has much worse skin breakdown in the left leg as well multiple open areas medially and posteriorly as well as laterally. He tells me he has been using his compression pumps but tells me he feels that the drainage out of his leg is worse when he uses a compression pumps. T be fair to him he is been saying this o for a while however I don't know that I have really been listening to this. I wonder if the compression pumps are working properly 12/22/17;. Once again he arrives with severe erythema, weeping edema from the left greater than right leg. Noncompliance with compression pumps. New this visit he is complaining of pain on the lateral aspect of the right leg and the medial aspect of his right thigh. He apparently saw his cardiologist Dr. [name] who was ordered an echocardiogram area and I think this is a step in the right direction 12/25/17; started his doxycycline Monday night. There is still intense erythema of the right leg especially in the anterior thigh although there is less tenderness. The erythema around the wound on the right lateral calf also is less tender. He still complaining of pain in the left heel. His wounds are about the same right lateral left medial left lateral. Superficial but certainly not close to closure. He denies being systemically unwell no fever chills no abdominal pain no diarrhea 12/29/17; back in follow-up of his extensive right calf and right thigh cellulitis. I added amoxicillin to cover possible doxycycline resistant strep. This seems to of done the trick he is in much less pain there is much less erythema and swelling. He has his echocardiogram at 11:00 this morning. X-ray of the left heel was also negative. 01/05/18; the patient  arrived with his edema under much better control. Now that he is retired he is able to use his compression pumps daily and sometimes twice a day per the patient. He has a wound on the right leg the lateral wound looks better. Area on the left leg also looks a lot better. He has no evidence of cellulitis in his bilateral thighs I had a quick peak at his echocardiogram. He is in normal ejection fraction and normal left ventricular function. He has moderate pulmonary hypertension moderately reduced right ventricular function. One would have to wonder about chronic sleep apnea although he says he doesn't snore. He'll review the echocardiogram with his cardiologist. 01/12/18; the patient arrives with the edema in both legs under exemplary control. He is using his compression pumps daily and sometimes twice daily. His wound on the right lateral  leg is just about closed. He still has some weeping areas on the posterior left calf and lateral left calf although everything is just about closed here as well. I have spoken with Aldean Baker who is the patient's nurse practitioner and infectious disease. She was concerned that the patient had not understood that the parenteral penicillin injections he was receiving for cellulitis prophylaxis was actually benefiting him. I don't think the patient actually saw that I would tend to agree we were certainly dealing with less infections although he had a serious one last month. 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 He is healed. He'll be placed in juxtalite compression wraps and increase his lymphedema pumps to twice daily. We will follow up again next week to ensure there are no issues with the new regiment. 01/20/18-he is here for evaluation of bilateral lower extremity weeping edema. Yesterday he was placed in compression wrap to the right lower extremity and compression stocking to left lower shrubbery. He states he uses  lymphedema pumps last night and again this morning and noted a blister to the left lower extremity. On exam he was noted to have drainage to the right lower extremity. He will be placed in Unna boots bilaterally and follow-up next week 01/26/18; patient was actually discharged a week ago to his own juxta light stockings only to return the next day with bilateral lower extremity weeping edema.he was placed in bilateral Unna boots. He arrives today with pain in the back of his left leg. There is no open area on the right leg however there is a linear/wrap injury on the left leg and weeping edema on the left leg posteriorly. I spoke with infectious disease about 10 days ago. They were disappointed that the patient elected to discontinue prophylactic intramuscular penicillin shots as they felt it was particularly beneficial in reducing the frequency of his cellulitis. I discussed this with the patient today. He does not share this view. He'll definitely need antibiotics today. Finally he is traveling to [place] and trauma leaving this Saturday and returning a week later and he does not travel with his pumps. He is going by car 01/30/18; patient was seen 4 days ago and brought back in today for review of cellulitis in the left leg posteriorly. I put him on amoxicillin this really hasn't helped as much as I might like. He is also worried because he is traveling to [facility] trauma by car. Finally we will be rewrapping him. There is no open area on the right leg over his left leg has multiple weeping areas as usual 02/09/18; The same wrap on for 10 days. He did not pick up the last doxycycline I prescribed for him. He apparently took 4 days worth he already had. There is nothing open on his right leg and the edema control is really quite good. He's had damage in the left leg medially and laterally especially probably related to the prolonged use of Unna boots 02/12/18; the patient arrived in clinic today for a  nurse visit/wrap change. He complained of a lot of pain in the left posterior calf. He is taking doxycycline that I previously prescribed for him. Unfortunately even though he used his stockings and apparently used to compression pumps twice a day he has weeping edema coming out of the lateral part of his right leg. This is coming from the lower anterior lateral skin area. 02/16/18; the patient has finished his doxycycline and will finish the amoxicillin  2 days. The area of cellulitis in the left calf posteriorly has resolved. He is no longer having any pain. He tells me he is using his compression pumps at least once a day sometimes twice. 02/23/18; the patient finished his doxycycline and Amoxil last week. On Friday he noticed a small erythematous circle about the size of a quarter on the left lower leg just above his ankle. This rapidly expanded and he now has erythema on the lateral and posterior part of the thigh. This is bright red. Also has an area on the dorsal foot just above his toes and a tender area just below the left popliteal fossa. He came off his prophylactic penicillin injections at his own insistence one or 2 months ago. This is obviously deteriorated since then 03/02/18; patient is on doxycycline and Amoxil. Culture I did last week of the weeping area on the back of his left calf grew group B strep. I have therefore renewed the amoxicillin 500 3 times a day for a further week. He has not been systemically unwell. Still complaining of an area of discomfort right under his left popliteal fossa. There is no open wound on the right leg. He tells me that he is using his pumps twice a day on most days 03/09/18; patient arrives in clinic today completing his amoxicillin today. The cellulitis on his left leg is better. Furthermore he tells me that he had intramuscular penicillin shots that his primary care office today. However he also states that the wrap on his right leg fell down shortly after  leaving clinic last week. He developed a large blister that was present when he came in for a nurse visit later in the week and then he developed intense discomfort around this area.He tells me he is using his compression pumps 03/16/18; the patient has completed his doxycycline. The infectious part of this/cellulitis in the left heel area left popliteal area is a lot better. He has 2 open areas on the right calf. Still areas on the left calf but this is a lot better as well. 03/24/18; the patient arrives complaining of pain in the left popliteal area again. He thinks some of this is wrap injury. He has no open area on the right leg and really no open area on the left calf either except for the popliteal area. He claims to be compliant with the compression pumps 03/31/18; I gave him doxycycline last week because of cellulitis in the left popliteal area. This is a lot better although the surface epithelium is denuded off and response to this. He arrives today with uncontrolled edema in the right calf area as well as a fingernail injury in the right lateral calf. There is only a few open areas on the left 04/06/18; I gave him amoxicillin doxycycline over the last 2 weeks that the amoxicillin should be completing currently. He is not complaining of any pain or systemic symptoms. The only open areas see has is on the right lateral lower leg paradoxically I cannot see anything on the left lower leg. He tells me he is using his compression pumps twice a day on most days. Silver alginate to the wounds that are open under 4 layer compression 04/13/18; he completed antibiotics and has no new complaints. Using his compression pumps. Silver alginate that anything that's opened 04/20/18; he is using his compression pumps religiously. Silver alginate 4 layer compression anything that's opened. He comes in today with no open wounds on the left leg but  3 on the right including a new one posteriorly. He has 2 on the right  lateral and one on the right posterior. He likes Unna boots on the right leg for reasons that aren't really clear we had the usual 4 layer compression on the left. It may be necessary to move to the 4 layer compression on the right however for now I left them in the Unna boots 04/27/18; he is using his compression pumps at least once a day. He has still the wounds on the right lateral calf. The area right posteriorly has closed. He does not have an open wound on the left under 4 layer compression however on the dorsal left foot just proximal to the toes and the left third toe 2 small open areas were identified 05/11/18; he has not uses compression pumps. The areas on the right lateral calf have coalesced into one large wound necrotic surface. On the left side he has one small wound anteriorly however the edema is now weeping out of a large part of his left leg. He says he wasn't using his pumps because of the weeping fluid. I explained to him that this is the time he needs to pump more 05/18/18; patient states he is using his compression pumps twice a day. The area on the right lateral large wound albeit superficial. On the left side he has innumerable number of small new wounds on the left calf particularly laterally but several anteriorly and medially. All these appear to have healthy granulated base these look like the remnants of blisters however they occurred under compression. The patient arrives in clinic today with his legs somewhat better. There is certainly less edema, less multiple open areas on the left calf and the right anterior leg looks somewhat better as well superficial and a little smaller. However he relates pain and erythema over the last 3-4 days in the thigh and I looked at this today. He has not been systemically unwell no fever no chills no change in blood sugar values 05/25/18; comes in today in a better state. The severe cellulitis on his left leg seems better with the Keflex.  Not as tender. He has not been systemically unwell Hard to find an open wound on the left lower leg using his compression pumps twice a day The confluent wounds on his right lateral calf somewhat better looking. These will ultimately need debridement I didn't do this today. 06/01/18; the severe cellulitis on the left anterior thigh has resolved and he is completed his Keflex. There is no open wound on the left leg however there is a superficial excoriation at the base of the third toe dorsally. Skin on the bottom of his left foot is macerated looking. The left the wounds on the lateral right leg actually looks some better although he did require debridement of the top half of this wound area with an open curet 06/09/18 on evaluation today patient appears to be doing poorly in regard to his right lower extremity in particular this appears to likely be infected he has very thick purulent discharge along with a bright green tent to the discharge. This makes me concerned about the possibility of pseudomonas. He's also having increased discomfort at this point on evaluation. Fortunately there does not appear to be any evidence of infection spreading to the other location at this time. 06/16/18 on evaluation today patient appears to actually be doing fairly well. His ulcer has actually diminished in size quite significantly at this point  which is good news. Nonetheless he still does have some evidence of infection he did see infectious disease this morning before coming here for his appointment. I did review the results of their evaluation and their note today. They did actually have him discontinue the Cipro and initiate treatment with linezolid at this time. He is doing this for the next seven days and they recommended a follow-up in four months with them. He is the keep a log of the need for intermittent antibiotic therapy between now and when he falls back up with infectious disease. This will help them  gaze what exactly they need to do to try and help them out. 06/23/18; the patient arrives today with no open wounds on the left leg and left third toe healed. He is been using his compression pumps twice a day. On the right lateral leg he still has a sizable wound but this is a lot better than last time I saw this. 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 Has been using silver alginate under 4 layer compression 06/30/18; the only open wound he has is on the right lateral leg and this looks healthy. No debridement is required. We have been using silver alginate. He does not have an open wound on the left leg. There is apparently some drainage from the dorsal proximal third toe on the left although I see no open wound here. 07/03/18 on evaluation today patient was actually here just for a nurse visit rapid change. However when he was here on Wednesday for his rat change due to having been healed on the left and then developing blisters we initiated the wrap again knowing that he would be back today for [place] to reevaluate and see were at. Unfortunately he has developed some cellulitis into the proximal portion of his right lower extremity even into the region of his thigh. 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 He was placed on one as what at that point. Nonetheless he is done with that and has been tolerating it well otherwise. Doxycycline which in the past really did not seem to be effective for him. Nonetheless I think the best option may be for [place] to definitely reinitiate the antibiotics for a longer period of time. 07/07/18; since I last saw this patient a week ago he has had a difficult time. At that point he did not have an open wound on his left leg. We transitioned him into juxta light stockings. He was apparently in the clinic the next day with  blisters on the left lateral and left medial lower calf. He also had weeping edema fluid. He was put back into a compression wrap. He was also in the clinic on Friday with intense erythema in his right thigh. Per the patient he was started on Bactrim however that didn't work at all in terms of relieving his pain and swelling. He has taken 3 doxycycline that he had left over from last time and that seems to of helped. He has blistering on the right thigh as well. 07/14/18; the erythema on his right thigh has gotten better with doxycycline that he is finishing. 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 Presumably this cellulitis in the thigh was not related to that although  I think this is a bit concerning going forward. He still has an area on the right lateral calf the blister on the right medial calf just below the knee that was discussed above. On the left 2 small open areas left medial and left lateral. Edema control is adequate. He is using his compression pumps twice a day 07/20/18; continued improvement in the condition of both legs especially the edema in his bilateral thighs. He tells me he is been losing weight through a combination of diet and exercise. He is using his compression pumps twice a day. So overall she made to the remaining wounds 07/27/2018; continued improvement in condition of both legs. His edema is well controlled. The area on the right lateral leg is just about closed he had one blisters show up on the medial left upper calf. We have him in 4 layer compression. He is going on a 10-day trip to [place], [place]. He will be driving. He wants to wear Unna boots because of the lessening amount of constriction. He will not use compression pumps while he is away 08/05/18 on evaluation today patient actually appears to be doing decently well all things considered in regard to his bilateral  lower extremities. The worst ulcer is actually only posterior aspect of his left lower extremity with a four layer compression wrap cut into his leg a couple weeks back. He did have a trip and actually had [employer] for the trip that he is worn since he was last here. Nonetheless he feels like the [employer] actually do better for him his swelling is up a little bit but he also with his trip was not taking his Lasix on a regular set schedule like he was supposed to be. He states that obviously the reason being that he cannot drive and keep going without having to urinate too frequently which makes it difficult. He did not have his pumps with him while he was away either which I think also maybe playing a role here too. 08/13/2018; the patient only has a small open wound on the right lateral calf which is a big improvement in the last month or 2. He also has the area posteriorly just below the posterior fossa on the left which I think was a wrap injury from several weeks ago. He has no current evidence of cellulitis. He tells me he is back into his compression pumps twice a day. He also tells me that while he was at the laundromat somebody stole a section of his extremitease stockings 08/20/2018; back in the clinic with a much improved state. He only has small areas on the right lateral mid calf which is just about healed. This was is more substantial area for quite a prolonged period of time. He has a small open area on the left anterior tibia. The area on the posterior calf just below the popliteal fossa is closed today. He is using his compression pumps twice a day 08/28/2018; patient has no open wound on the right leg. He has a smattering of open areas on the calf with some weeping lymphedema. More problematically than that it looks as though his wraps of slipped down in his usual he has very angry upper area of edema just below the right medial knee and on the right lateral calf. He has  no open area on his feet. The patient is traveling to [facility] next week. I will send him in an antibiotic.  We will continue to wrap the right leg. We ordered extremitease stockings for him last week and I plan to transition the right leg to a stocking when he gets home which will be in 10 days time. As usual he is very reluctant to take his pumps with him when he travels 09/07/2018; patient returns from [facility]. He shows me a picture of his left leg in the mid part of his trip last week with intense fire engine erythema. The picture look bad enough I would have considered sending him to the hospital. Instead he went to the wound care center in [facility]. They did not prescribe him antibiotics but he did take some doxycycline he had leftover from a previous visit. I had given him trimethoprim sulfamethoxazole before he left this did not work according to the patient. This is resulted in some improvement fortunately. He comes back with a large wound on the left posterior calf. Smaller area on the left anterior tibia. Denuded blisters on the dorsal left foot over his toes. Does not have much in the way of wounds on the right leg although he does have a very tender area on the right posterior area just below the popliteal fossa also suggestive of infection. He promises me he is back on his pumps twice a day 09/15/2018; the intense cellulitis in his left lower calf is a lot better. The wound area on the posterior left calf is also so better. However he has reasonably extensive wounds on the dorsal aspect of his second and third toes and the proximal foot just at the base of the toes. There is nothing open on the right leg 09/22/2018; the patient has excellent edema control in his legs bilaterally. He is using his external compression pumps twice a day. He has no open area on the right leg and only the areas in the left foot dorsally second and third toe area on the left side. He does  not have any signs of active cellulitis. 10/06/2018; the patient has good edema control bilaterally. He has no open wound on the right leg. There is a blister in the posterior aspect of his left calf that we had to deal with today. He is using his compression pumps twice a day. There is no signs of active cellulitis. We have been using silver alginate to the wound areas. He still has vulnerable areas on the base of his left first second toes dorsally He has a his extremities stockings and we are going to transition him today into the stocking on the right leg. He is cautioned that he will need to continue to use the compression pumps twice a day. If he notices uncontrolled edema in the right leg he may need to go to 3 times a day. 10/13/2018; the patient came in for a nurse check on Friday he has a large flaccid blister on the right medial calf just below the knee. We unroofed this. He has this and a new area underneath the posterior mid calf which was undoubtedly a blister as well. He also has several small areas on the right which is the area we put his extremities stocking on. 10/19/2018; the patient went to see infectious disease this morning I am not sure if that was a routine follow-up in any case the doxycycline I had given him was discontinued and started on linezolid. He has not started this. It is easy to look at his left calf and the inflammation and think  this is cellulitis however he is very tender in the tissue just below the popliteal fossa and I have no doubt that there is infection going on here. He states the problem he is having is that with the compression pumps the edema goes down and then starts walking the wrap falls down. We will see if we can adhere this. He has 1 or 2 minuscule open areas on the right still areas that are weeping on the posterior left calf, the base of his left second and third toes 10/26/18; back today in clinic with quite of skin breakdown in his left  anterior leg. This may have been infection the area below the popliteal fossa seems a lot better however tremendous epithelial loss on the left anterior mid tibia area over quite inexpensive tissue. He has 2 blisters on the right side but no other open wound here. 10/29/2018; came in urgently to see [place] today and we worked him in for review. He states that the 4 layer compression on the right leg caused pain he had to cut it down to roughly his mid calf this caused swelling above the wrap and he has blisters and skin breakdown today. As a result of the pain he has not been using his pumps. Both legs are a lot more edematous and there is a lot of weeping fluid. 11/02/18; arrives in clinic with continued difficulties in the right leg> left. Leg is swollen and painful. multiple skin blisters and new open areas especially laterally. He has not been using his pumps on the right leg. 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 He is not systemically unwell. 11/09/2018; the patient claims he is being compliant with his pumps. He is finished the doxycycline I gave him last week. Culture I did of the wound on the right lateral leg showed a few very resistant methicillin staph aureus. This was resistant to doxycycline. Nevertheless he states the pain in the leg is a lot better which makes me wonder if the cultured organism was not really what was causing the problem nevertheless this is a very dangerous organism to be culturing out of any wound. His right leg is still a lot larger than the left. He is using an [employer] broadcast assistant on this area, he blames a 4-layer compression for causing the original skin breakdown which I doubt is true however I cannot talk him out of it. We have been using silver alginate to all of these areas which were initially blisters 11/16/2018; patient is being compliant with his external compression pumps at twice a day. Miraculously he arrives in clinic  today with absolutely no open wounds. He has better edema control on the left where he has been using 4 layer compression versus wound of wounds on the right and I pointed this out to him. There is no inflammation in the skin in his lower legs which is also somewhat unusual for him. There is no open wounds on the dorsal left foot. He has extremitease stockings at home and I have asked him to bring these in next week. 11/25/18 patient's lower extremity on examination today on the left appears for the most part to be wound free. He does have an open wound on the lateral aspect of the right lower extremity but this is minimal compared to what I've seen in past. He does request that we go ahead and wrap the left leg as well even though there's nothing open just  so hopefully it will not reopen in short order. 1/28; patient has superficial open wounds on the right lateral calf left anterior calf and left posterior calf. His edema control is adequate. 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 We have been using silver alginate as the primary dressing. He claims compliance with his compression pumps 2/4; patient has superficial open wounds on numerous areas of his left calf and again one on the left dorsal foot. The areas on the right lateral calf have healed. The cellulitis that I gave him doxycycline for last week is also resolved this was mostly on the left anterior calf just below the tibial tuberosity. His edema looks fairly well-controlled. He tells me he went to see his primary doctor today and had blood work ordered 2/11; once again he has several open areas on the left calf left tibial area. Most of these are small and appear to have healthy granulation. He does not have anything open on the right. The edema and control in his thighs is pretty good which is usually a good indication he has been using his pumps as  requested. 2/18; he continues to have several small areas on the left calf and left tibial area. Most of these are small healthy granulation. We put him in his stocking on the right leg last week and he arrives with a superficial open area over the right upper tibia and a fairly large area on the right lateral tibia in similar condition. His edema control actually does not look too bad, he claims to be using his compression pumps twice a day 2/25. Continued small areas on the left calf and left tibial area. New areas especially on the right are identified just below the tibial tuberosity and on the right upper tibia itself. There are also areas of weeping edema fluid even without an obvious wound. He does not have a considerable degree of lymphedema but clearly there is more edema here than his skin can handle. He states he is using the pumps twice a day. We have an Unna boot on the right and 4 layer compression on the left. 3/3; he continues to have an area on the right lateral calf and right posterior calf just below the popliteal fossa. There is a fair amount of tenderness around the wound on the popliteal fossa but I did not see any evidence of cellulitis, could just be that the wrap came down and rubbed in this area. He does not have an open area on the left leg however there is an area on the left dorsal foot at the base of the third toe We have been using silver alginate to all wound areas 3/10; he did not have an open area on his left leg last time he was here a week ago. T oday he arrives with a horizontal wound just below the tibial tuberosity and an area on the left lateral calf. He has intense erythema and tenderness in this area. The area is on the right lateral calf and right posterior calf better than last week. We have been using silver alginate as usual 3/18 - Patient returns with 3 small open areas on left calf, and 1 small open area on right calf, the skin looks ok with no  significant erythema, he continues the UNA boot on right and 4 layer compression on left. The right lateral calf wound is closed , the  right posterior is small area. we will continue silver alginate to the areas. 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 He is been tolerating the dressing changes without complication. Fortunately she has made excellent improvement in regard to the overall status of his wounds. Unfortunately every time we cease wrapping him he ends up reopening in causing more significant issues at that point. Again I'm unsure of the best direction to take although I think the lymphedema clinic may be appropriate for him. 02/03/19 on evaluation today patient appears to be doing well in regard to the wounds that we saw him for last week unfortunately he has a new area on the proximal portion of his right medial/posterior lower extremity where the wrap somewhat slowed down and caused swelling and a blister to rub and open. Unfortunately this is the only opening that he has on either leg at this point. 02/17/19 on evaluation today patient's bilateral lower extremities appear to be doing well. He still completely healed in regard to the left lower extremity. In regard to the right lower extremity the area where the wrap and slid down and caused the blister still seems to be slightly open although this is dramatically better than during the last evaluation two weeks ago. I'm very pleased with the way this stands overall. 03/03/19 on evaluation today patient appears to be doing well in regard to his right lower extremity in general although he did have a new blister open this does not appear to be showing any evidence of active infection at this time. Fortunately there's No fevers, chills, nausea, or  vomiting noted at this time. Overall I feel like he is making good progress it does feel like that the right leg will we perform the [employer] seems to do with a bit better than three layer wrap on the left which slid down on him. We may switch to doing bilateral in the book wraps. 5/4; I have not seen Mr. Tritschler in quite some time. According to our case manager he did not have an open wound on his left leg last week. He had 1 remaining wound on the right posterior medial calf. He arrives today with multiple openings on the left leg probably were blisters and/or wrap injuries from Unna boots. I do not think the Unna boot's will provide adequate compression on the left. I am also not clear about the frequency he is using the compression pumps. 03/17/19 on evaluation today patient appears to be doing excellent in regard to his lower extremities compared to last week's evaluation apparently. He had gotten significantly worse last week which is unfortunate. The [employer] wrap on the left did not seem to do very well for him at all and in fact it didn't control his swelling significantly enough he had an additional outbreak. Subsequently we go back to the four layer compression wrap on the left. This is good news. At least in that he is doing better and the wound seem to be killing him. He still has not heard anything from the lymphedema clinic. 03/24/19 on evaluation today patient actually appears to be doing much better in regard to his bilateral lower [place] as compared to last week when I saw him. Fortunately there's no signs of active infection at this time. He has been tolerating the  dressing changes without complication. Overall I'm extremely pleased with the progress and appearance in general. 04/07/19 on evaluation today patient appears to be doing well in regard to his bilateral lower extremities. His swelling is significantly down from where it was previous. With that being said he does have a  couple blisters still open at this point but fortunately nothing that seems to be too severe and again the majority of the larger openings has healed at this time. 04/14/19 on evaluation today patient actually appears to be doing quite well in regard to his bilateral lower extremities in fact I'm not even sure there's anything significantly open at this time at any site. Nonetheless he did have some trouble with these wraps where they are somewhat irritating him secondary to the fact that he has noted that the graph wasn't too close down to the end of this foot in a little bit short as well up to his knee. Otherwise things seem to be doing quite well. 04/21/19 upon evaluation today patient's wound bed actually showed evidence of being completely healed in regard to both lower extremities which is excellent news. There does not appear to be any signs of active infection which is also good news. I'm very pleased in this regard. No fevers, chills, nausea, or vomiting noted at this time. 04/28/19 on evaluation today patient appears to be doing a little bit worse in regard to both lower extremities on the left mainly due to the fact that when he went infection disease the wrap was not wrapped quite high enough he developed a blister above this. On the right he is a small open area of nothing too significant but again this is continuing to give him some trouble he has been were in the Velcro compression that he has at home. 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 He's been tolerating the dressing changes without complication. Fortunately there's no signs of active infection at this time. No fevers, chills, nausea, or vomiting noted at this time. We have been trying to get an appointment with her lymphedema clinic in [facility] but unfortunately nobody can get them on phone with not been able to even fax information over the  patient likewise is not been able to get in touch with them. Overall I'm not sure exactly what's going on here with to reach out again today. 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 Still having a lot of drainage unfortunately. He tells me especially in the left but even on the right. There's no signs of active infection which is good news we've been using so ratcheted up to this point. 05/19/19 on evaluation today patient actually appears to be doing quite well with regard to his left lower extremity which is great news. Fortunately in regard to the right lower extremity has an issues with his wrap and he subsequently did remove this from what I'm understanding. Nonetheless long story short is what he had rewrapped once he removed it subsequently had maggots underneath this wrap whenever he came in for evaluation today. With that being said they were obviously completely cleaned away by the nursing staff. The visit today which is excellent news. However he does appear to potentially have some infection around the right ankle region where the maggots were located as well. He will likely require anabiotic therapy today. 05/26/19 on evaluation  today patient actually appears to be doing much better in regard to his bilateral lower extremities. I feel like the infection is under much better control. With that being said there were maggots noted when the wrap was removed yet again today. Again this could have potentially been left over from previous although at this time there does not appear to be any signs of significant drainage there was obviously on the wrap some drainage as well this contracted gnats or otherwise. Either way I do not see anything that appears to be doing worse in my pinion and in fact I think his drainage has slowed down quite significantly likely mainly due to the fact to his infection being under  better control. 06/02/2019 on evaluation today patient actually appears to be doing well with regard to his bilateral lower extremities there is no signs of active infection at this time which is great news. With that being said he does have several open areas more so on the right than the left but nonetheless these are all significantly better than previously noted. 06/09/2019 on evaluation today patient actually appears to be doing well. His wrap stayed up and he did not cause any problems he had more drainage on the right compared to the left but overall I do not see any major issues at this time which is great news. 06/16/2019 on evaluation today patient appears to be doing excellent with regard to his lower extremities the only area that is open is a new blister that can have opened as of today on the medial ankle on the left. Other than this he really seems to be doing great I see no major issues at this point. 06/23/2019 on evaluation today patient appears to be doing quite well with regard to his bilateral lower extremities. In fact he actually appears to be almost completely healed there is a small area of weeping noted of the right lower extremity just above the ankle. Nonetheless fortunately there is no signs of active infection at this time which is good news. No fevers, chills, nausea, vomiting, or diarrhea. 8/24; the patient arrived for a nurse visit today but complained of very significant pain in the left leg and therefore I was asked to look at this. Noted that he did not have an open area on the left leg last week nevertheless this was wrapped. The patient states that he is not been able to put his compression pumps on the left leg because of the discomfort. He has not been systemically unwell 06/30/2019 on evaluation today patient unfortunately despite being excellent last week is doing much worse with regard to his left lower extremity today. In fact he had to come in for a nurse on  Monday where his left leg had to be rewrapped due to excessive weeping Dr. Leanord Hawking placed him on doxycycline at that point. Fortunately there is no signs of active infection Systemically at this time which is good news. 07/07/2019 in regard to the patient's wounds today he actually seems to be doing well with his right lower extremity there really is nothing open or draining at this point this is great news. Unfortunately the left lower extremity is given him additional trouble at this time. There does not appear to be any signs of active infection nonetheless he does have a lot of edema and swelling noted at this point as well as blistering all of which has led to a much more poor appearing leg at this time compared to  where it was 2 weeks ago when it was almost completely healed. Obviously this is a little discouraging for the patient. He is try to contact the lymphedema clinic in [place] he has not been able to get through to them. 07/14/2019 on evaluation today patient actually appears to be doing slightly better with regard to his left lower extremity ulcers. Overall I do feel like at least at the top of the wrap that we have been placing this area has healed quite nicely and looks much better. The remainder of the leg is showing signs of improvement. Unfortunately in the thigh area he still has an open region on the left and again on the right he has been utilizing just a Band-Aid on an area that also opened on the thigh. Again this is an area that were not able to wrap although we did do an Ace wrap to provide some compression that something that obviously is a little less effective than the compression wraps we have been using on the lower portion of the leg. He does have an appointment with the lymphedema clinic in [facility] on Friday. 07/21/2019 on evaluation today patient appears to be doing better with regard to his lower extremity ulcers. He has been tolerating the dressing changes  without complication. Fortunately there is no signs of active infection at this time. No fevers, chills, nausea, vomiting, or diarrhea. I did receive the paperwork from the physical therapist at the lymphedema clinic in [place]. Subsequently I signed off on that this morning and sent that back to him for further progression with the treatment plan. 07/28/2019 on evaluation today patient appears to be doing very well with regard to his right lower extremity where I do not see any open wounds at this point. Fortunately he is feeling great as far as that is concerned as well. In regard to the left lower extremity he has been having issues with still several areas of weeping and edema although the upper leg is doing better his lower leg still I think is going require the compression wrap at this time. No fevers, chills, nausea, vomiting, or diarrhea. 08/04/2019 on evaluation today patient unfortunately is having new wounds on the right lower extremity. Again we have been using Unna boot wrap on that side. We switched him to using his juxta lite wrap at home. With that being said he tells me he has been using it although his legs extremely swollen and to be honest really does not appear that he has been. I cannot know that for sure however. Nonetheless he has multiple new wounds on the right lower extremity at this time. Obviously we will have to see about getting this rewrapped for him today. 08/11/2019 on evaluation today patient appears to be doing fairly well with regard to his wounds. He has been tolerating the dressing changes including the compression wraps without complication. He still has a lot of edema in his upper thigh regions bilaterally he is supposed to be seeing the lymphedema clinic on the 15th of this month once his wraps arrive for the upper part of his legs. 08/18/2019 on evaluation today patient appears to be doing well with regard to his bilateral lower extremities at this point.  He has been tolerating the dressing changes without complication. Fortunately there is no signs of active infection which is also good news. He does have a couple weeping areas on the first and second toe of the right foot he also has just a small area  on the left foot upper leg and a small area on the left lower leg but overall he is doing quite well in my opinion. He is supposed to be getting his wraps shortly in fact tomorrow and then subsequently is seeing the lymphedema clinic next Wednesday on the 21st. Of note he is also leaving on the 25th to go on vacation for a week to the beach. For that reason and since there is some uncertainty about what there can be doing at lymphedema clinic next Wednesday I am get a make an appointment for next Friday here for [place] to see what we need to do for him prior to him leaving for vacation. 10/23; patient arrives in considerable pain predominantly in the upper posterior calf just distal to the popliteal fossa also in the wound anteriorly above the major wound. This is probably cellulitis and he has had this recurrently in the past. He has no open wound on the right side and he has had an Radio broadcast assistant in that area. Finally I note that he has an area on the left posterior calf which by enlarge is mostly epithelialized. This protrudes beyond the borders of the surrounding skin in the setting of dry scaly skin and lymphedema. The patient is leaving for [facility] on Sunday. Per his longstanding pattern, he will not take his compression pumps with him predominantly out of fear that they will be stolen. He therefore asked that we put a Unna boot back on the right leg. He will also contact the wound care center in [facility] to see if they can change his dressing in the mid week. 11/3; patient returned from his vacation to [facility]. He was seen on 1 occasion at their wound care center. They did a 2 layer compression system as they did not have our 4-layer wrap. I  am not completely certain what they put on the wounds. They did not change the Unna boot on the right. The patient is also seeing a lymphedema specialist physical therapist in [place]. It appears that he has some compression sleeve for his thighs which indeed look quite a bit better than I am used to seeing. He pumps over these with his external compression pumps. 11/10; the patient has a new wound on the right medial thigh otherwise there is no open areas on the right. He has an area on the left leg posteriorly anteriorly and medially and an area over the left second toe. We have been using silver alginate. He thinks the injury on his thigh is secondary to friction from the compression sleeve he has. 11/17; the patient has a new wound on the right medial thigh last week. He thinks this is because he did not have a underlying stocking for his thigh juxta lite apparatus. He now has this. The area is fairly large and somewhat angry but I do not think he has underlying cellulitis. He has a intact blister on the right anterior tibial area. Small wound on the right great toe dorsally Small area on the medial left calf. 11/30; the patient does not have any open areas on his right leg and we did not take his juxta lite stocking off. However he states that on Friday his compression wrap fell down lodging around his upper mid calf area. As usual this creates a lot of problems for him. He called urgently today to be seen for a nurse visit however the nurse visit turned into a provider visit because of extreme  erythema and pain in the left anterior tibia extending laterally and posteriorly. The area that is problematic is extensive 10/06/2019 upon evaluation today patient actually appears to be doing poorly in regard to his left lower extremity. He Dr. Leanord Hawking did place him on doxycycline this past Monday apparently due to the fact that he was doing much worse in regard to this left leg. Fortunately the  doxycycline does seem to be helping. Unfortunately we are still having a very difficult time getting his edema under any type of control in order to anticipate discharge at some point. The only way were really able to control his lymphedema really is with compression wraps and that has only even seemingly temporary. He has been seeing a lymphedema clinic they are trying to help in this regard but still this has been somewhat frustrating in general for the patient. 10/13/19 on evaluation today patient appears to be doing excellent with regard to his right lower extremity as far as the wounds are concerned. His swelling is still quite extensive unfortunately. He is still having a lot of drainage from the thigh areas bilaterally which is unfortunate. He's been going to lymphedema clinic but again he still really does not have this edema under control as far as his lower extremities are concern. With regard to his left lower extremity this seems to be improving and I do believe the doxycycline has been of benefit for him. He is about to complete the doxycycline. 10/20/2019 on evaluation today patient appears to be doing poorly in regard to his bilateral lower extremities. More in the right thigh he has a lot of irritation at this site unfortunately. In regard to the left lower extremity the wrap was not quite as high it appears and does seem to have caused him some trouble as well. Fortunately there is no evidence of systemic infection though he does have some blue-green drainage which has me concerned for the possibility of Pseudomonas. He tells me he is previously taking Cipro without complications and he really does not care for Levaquin however due to some of the side effects he has. He is not allergic to any medications specifically antibiotics that were aware of. 10/27/2019 on evaluation today patient actually does appear to be for the most part doing better when compared to last week's evaluation.  With that being said he still has multiple open wounds over the bilateral lower extremities. He actually forgot to start taking the Cipro and states that he still has the whole bottle. He does have several new blisters on left lower extremity today I think I would recommend he go ahead and take the Cipro based on what I am seeing at this point. 12/30-Patient comes at 1 week visit, 4 layer compression wraps on the left and Unna boot on the right, primary dressing Xtrasorb and silver alginate. Patient is taking his Cipro and has a few more days left probably 5-6, and the legs are doing better. He states he is using his compressions devices which I believe he has 11/10/2019 on evaluation today patient actually appears to be much better than last time I saw him 2 weeks ago. His wounds are significantly improved and overall I am very pleased in this regard. Fortunately there is no signs of active infection at this time. He is just a couple of days away from completing Cipro. Overall his edema is much better he has been using his lymphedema pumps which I think is also helping at this point. 11/17/2019  on evaluation today patient appears to be doing excellent in regard to his wounds in general. His legs are swollen but not nearly as much as they have been in the past. Fortunately he is tolerating the compression wraps without complication. No fevers, chills, nausea, vomiting, or diarrhea. He does have some erythema however in the distal portion of his right lower extremity specifically around the forefoot and toes there is a little bit of warmth here as well. 11/24/2019 on evaluation today patient appears to be doing well with regard to his right lower extremity I really do not see any open wounds at this point. His left lower extremity does have several open areas and his right medial thigh also is open. Other than this however overall the patient seems to be making good progress and I am very pleased at this  point. 12/01/2019 on evaluation today patient appears to be doing poorly at this point in regard to his left lower extremity has several new blisters despite the fact that we have him in compression wraps. In fact he had a 4-layer compression wrap, his upper thigh wrapped from lymphedema clinic, and a juxta light over top of the 4 layer compression wrap the lymphedema clinic applied and despite all this he still develop blisters underneath. Obviously this does have me concerned about the fact that unfortunately despite what we are doing to try to get wounds healed he continues to have new areas arise I do not think he is ever good to be at the point where he can realistically just use wraps at home to keep things under control. Typically when we heal him it takes about 1-2 days before he is back in the clinic with severe breakdown and blistering of his lower extremities bilaterally. This is happened numerous times in the past. Unfortunately I think that we may need some help as far as overall fluid overload to kind of limit what we are seeing and get things under better control. 12/08/2019 on evaluation today patient presents for follow-up concerning his ongoing bilateral lower extremity edema. Unfortunately he is still having quite a bit of swelling the compression wraps are controlling this to some degree but he did see Dr. [name] his cardiologist I do have that available for review today as far as the appointment was concerned that was on 12/06/2019. Obviously that she has been 2 days ago. The patient states that he is only been taking the Lasix 80 mg 1 time a day he had told me previously he was taking this twice a day. Nonetheless Dr. [name] recommended this be up to 80 mg 2 times a day for the patient as he did appear to be fluid overloaded. With that being said the patient states he did this yesterday and he was unable to go anywhere or do anything due to the fact that he was constantly having to  urinate. Nonetheless I think that this is still good to be something that is important for him as far as trying to get his edema under control at all things that he is going to be able to just expect his wounds to get under control and things to be better without going through at least a period of time where he is trying to stabilize his fluid management in general and I think increasing the Lasix is likely the first step here. It was also mentioned the possibility that the patient may require metolazone. With that being said he wanted to have the patient  take Lasix twice a day first and then reevaluating 2 months to see where things stand. 12/15/2019 upon evaluation today patient appears to be doing regard to his legs although his toes are showing some signs of weeping especially on the left at this point to some degree on the right. There does not appear to be any signs of active infection and overall I do feel like the compression wraps are doing well for him but he has not been able to take the Lasix at home and the increased dose that Dr. [name] recommended. He tells me that just not go to be feasible for him. Nonetheless I think in this case he should probably send a message to Dr. [name] in order to discuss options from the standpoint of possible admission to get the fluid off or otherwise going forward. 12/22/2019 upon evaluation today patient appears to be doing fairly well with regard to his lower extremities at this point. In fact he would be doing excellent if it was not for the fact that his right anterior thigh apparently had an allergic reaction to adhesive tape that he used. The wound itself that we have been monitoring actually appears to be healed. There is a lot of irritation at this point. 12/29/2019 upon evaluation today patient appears to be doing well in regard to his lower extremities. His left medial thigh is open and somewhat draining today but this is the only region that is open  the right has done much better with the treatment utilizing the steroid cream that I prescribed for him last week. Overall I am pleased in that regard. Fortunately there is no signs of active infection at this time. No fevers, chills, nausea, vomiting, or diarrhea. 01/05/2020 upon evaluation today patient appears to be doing more poorly in regard to his right lower extremity at this point upon evaluation today. Unfortunately he continues to have issues in this regard and I think the biggest issue is controlling his edema. This obviously is not very well controlled at this point is been recommended that he use the Lasix twice a day but he has not been able to do that. Unfortunately I think this is leading to an issue where honestly he is not really able to effectively control his edema and therefore the wounds really are not doing significantly better. I do not think that he is going to be able to keep things under good control unless he is able to control his edema much better. I discussed this again in great detail with him today. 01/12/2020 good news is patient actually appears to be doing quite well today at this point. He does have an appointment with lymphedema clinic tomorrow. His legs appear healed and the toe on the left is almost completely healed. In general I am very pleased with how things stand at this point. 01/19/2020 upon evaluation today patient appears to actually be doing well in regard to his lower extremities there is nothing open at this point. Fortunately he has done extremely well more recently. Has been seeing lymphedema clinic as well. With that being said he has Velcro wraps for his lower legs as well as his upper legs. The only wound really is on his toe which is the right great toe and this is barely anything even there. With all that being said I think it is good to be appropriate today to go ahead and switch him over to the Velcro compression wraps. 01/26/2020 upon evaluation  today patient appears  to be doing worse with regard to his lower extremities after last week switch him to Velcro compression wraps. Unfortunately he lasted less than 24 hours he did not have the sock portion of his Velcro wrap on the left leg and subsequently developed a blister underneath the Velcro portion. Obviously this is not good and not what we were looking for at this point. He states the lymphedema clinic did tell him to wear the wrap for 23 hours and take him off for 1 I am okay with that plan but again right now we got a get things back under control again he may have some cellulitis noted as well. 02/02/2020 upon evaluation today patient unfortunately appears to have several areas of blistering on his bilateral lower extremities today mainly on the feet. His legs do seem to be doing somewhat better which is good news. Fortunately there is no evidence of active infection at this time. No fevers, chills, nausea, vomiting, or diarrhea. 02/16/2020 upon evaluation today patient appears to be doing well at this time with regard to his legs. He has a couple weeping areas on his toes but for the most part everything is doing better and does appear to be sealed up on his legs which is excellent news. We can continue with wrapping him at this point as he had every time we discontinue the wraps he just breaks out with new wounds. There is really no point in is going forward with this at this point. 03/08/2020 upon evaluation today patient actually appears to be doing quite well with regard to his lower extremity ulcers. He has just a very superficial and really almost nonexistent blister on the left lower extremity he has in general done very well with the compression wraps. With that being said I do not see any signs of infection at this time which is good news. 03/29/2020 upon evaluation today patient appears to be doing well with regard to his wounds currently except for where he had several new areas  that opened up due to some of the wrap slipping and causing him trouble. He states he did not realize they had slipped. Nonetheless he has a 1 area on the right and 3 new areas on the left. Fortunately there is no signs of active infection at this time which is great news. 04/05/2020 upon evaluation today patient actually appears to be doing quite well in general in regard to his legs currently. Fortunately there is no signs of active infection at this time. No fevers, chills, nausea, vomiting, or diarrhea. He tells me next week that he will actually be seen in the lymphedema clinic on Thursday at 10 AM I see him on Wednesday next week. 04/12/2020 upon evaluation today patient appears to be doing very well with regard to his lower extremities bilaterally. In fact he does not appear to have any open wounds at this point which is good news. Fortunately there is no signs of active infection at this time. No fevers, chills, nausea, vomiting, or diarrhea. 04/19/2020 upon evaluation today patient appears to be doing well with regard to his wounds currently on the bilateral lower extremities. There does not appear to be any signs of active infection at this time. Fortunately there is no evidence of systemic infection and overall very pleased at this point. Nonetheless after I held him out last week he literally had blisters the next morning already which swelled up with him being right back here in the clinic. Overall I think  that he is just not can be able to be discharged with his legs the way they are he is much to volume overloaded as far as fluid is concerned and that was discussed with him today of also discussed this but should try the clinic nurse manager as well as Dr. Leanord Hawking. 04/26/2020 upon evaluation today patient appears to be doing better with regard to his wounds currently. He is making some progress and overall swelling is under good control with the compression wraps. Fortunately there is no  evidence of active infection at this time. 05/10/2020 on evaluation today patient appears to be doing overall well in regard to his lower extremities bilaterally. He is Tolerating the compression wraps without complication and with what we are seeing currently I feel like that he is making excellent progress. There is no signs of active infection at this time. 05/24/2020 upon evaluation today patient appears to be doing well in regard to his legs. The swelling is actually quite a bit down compared to where it has been in the past. Fortunately there is no sign of active infection at this time which is also good news. With that being said he does have several wounds on his toes that have opened up at this point. 05/31/2020 upon evaluation today patient appears to be doing well with regard to his legs bilaterally where he really has no significant fluid buildup at this point overall he seems to be doing quite well. Very pleased in this regard. With regard to his toes these also seem to be drying up which is excellent. We have continue to wrap him as every time we tried as a transition to the juxta light wraps things just do not seem to get any better. 06/07/2020 upon evaluation today patient appears to be doing well with regard to his right leg at this point. Unfortunately left leg has a lot of blistering he tells me the wrap started to slide down on him when he tried to put his other Velcro wrap over top of it to help keep things in order but nonetheless still had some issues. 06/14/2020 on evaluation today patient appears to be doing well with regard to his lower extremity ulcers and foot ulcers at this point. I feel like everything is actually showing signs of improvement which is great news overall there is no signs of active infection at this time. No fevers, chills, nausea, vomiting, or diarrhea. 06/21/2020 on evaluation today patient actually appears to be doing okay in regard to his wounds in general.  With that being said the biggest issue I see is on his right foot in particular the first and second toe seem to be doing a little worse due to the fact this is staying very wet. I think he is probably getting need to change out his dressings a couple times in between each week when we see him in regard to his toes in order to keep this drier based on the location and how this is proceeding. 06/28/2020 on evaluation today patient appears to be doing a little bit more poorly overall in regard to the appearance of the skin I am actually somewhat concerned about the possibility of him having a little bit of an infection here. We discussed the course of potentially giving him a doxycycline prescription which he is taken previously with good result. With that being said I do believe that this is potentially mild and at this point easily fixed. I just do not want anything  to get any worse. 07/12/2020 upon evaluation today patient actually appears to be making some progress with regard to his legs which is great news there does not appear to be any evidence of active infection. Overall very pleased with where things stand. 07/26/2020 upon evaluation today patient appears to be doing well with regard to his leg ulcers and toe ulcers at this point. He has been tolerating the compression wraps without complication overall very pleased in this regard. 08/09/2020 upon evaluation today patient appears to be doing well with regard to his lower extremities bilaterally. Fortunately there is no signs of active infection overall I am pleased with where things stand. 08/23/2020 on evaluation today patient appears to be doing well with regard to his wound. He has been tolerating the dressing changes without complication. Fortunately there is no signs of active infection at this time. Overall his legs seem to be doing quite well which is great news and I am very pleased in that regard. No fevers, chills, nausea, vomiting, or  diarrhea. 09/13/2020 upon evaluation today patient appears to be doing okay in regard to his lower extremities. He does have a fairly large blister on the right leg which I did remove the blister tissue from today so we can get this to dry out other than that however he seems to be doing quite well. There is no signs of active infection at this time. 09/27/2020 upon evaluation today patient appears to actually be doing some better in regard to his right leg. Fortunately signs of active infection at this time which is great news. No fevers, chills, nausea, vomiting, or diarrhea. 10/04/2020 upon evaluation today patient actually appears to be showing signs of improvement which is great news with regard to his leg ulcers. Fortunately there is no signs of active infection which is great news he is still taking the antibiotics currently. No fevers, chills, nausea, vomiting, or diarrhea. 10/18/2020 on evaluation today patient appears to be doing well with regard to his legs currently. He has been tolerating the dressing changes including the wraps without complication. Fortunately there is no signs of active infection at this time. No fevers, chills, nausea, vomiting, or diarrhea. 10/25/2020 upon evaluation today patient appears to be doing decently well in regard to his wounds currently. He has been tolerating the dressing changes without complication. Overall I feel like he is making good progress albeit slow. Again this is something we can have to continue to wrap for some time to come most likely. 11/08/2020 upon evaluation today patient appears to be doing well with regard to his wounds currently. He has been tolerating the dressing changes without complication is not currently on any antibiotics and he does not appear to show any signs of infection. He does continue to have a lot of drainage on the right leg not too severe but nonetheless this is very scattered. On the left leg this is looking to be much  improved overall. 11/15/2020 upon evaluation today patient appears to be doing better with regard to his legs bilaterally. Especially the right leg which was much more significant last week. There does not appear to be any signs of active infection which is great news. No fevers, chills, nausea, vomiting, or diarrhea. 11/23/2019 upon evaluation today patient appears to be doing poorly still in regard to his lower extremities bilaterally. Unfortunately his right leg in particular appears to be doing much more poorly there is no signs really of infection this is not warm to touch but  he does have a lot of drainage and weeping unfortunately. With that reason I do believe that we may need to initiate some treatment here to try to help calm down some of the swelling of the right leg. I think switching to a 4-layer compression wrap would be beneficial here. The patient is in agreement with giving this a try. 11/29/2020 upon evaluation today patient appears to be doing well currently in regard to his leg ulcers. I feel like the right leg is doing better he still has a lot of drainage but we do see some improvement here. The 4-layer compression wrap I think was helpful. 12/06/2020 upon evaluation today patient appears to be doing well with regard to his legs. In fact they seem to be doing about the best I have seen up to this point. Fortunately there is no signs of active infection at this time. No fevers, chills, nausea, vomiting, or diarrhea. 12/20/2020 upon evaluation today patient appears to be doing well at this time in regard to his legs. He is not having any significant draining which is great news. Fortunately there is no signs of active infection at this time. No fevers, chills, nausea, vomiting, or diarrhea. 01/17/2021 upon evaluation today Sie actually appears to be doing excellent in regard to his legs. He has a few areas again that come and go as far as his toes are concerned but overall this is doing  quite well. 01/31/2021 upon evaluation today patient appears to be doing well with regard to his legs. Fortunately there does not appear to be any signs of active infection which is great news. Overall he is still having significant edema despite the compression wraps basically the 4-layer compression wrap to just keep things under control there is really not much room for play. 4/13: Mr. Talton is a longstanding patient in our clinic and benefits greatly from weekly compression wraps. Today he has no complaints. He has been tolerating the wraps well. He states he is using the lymphedema pumps at home. 5/4; patient presents for follow-up of his chronic lymphedema/venous insufficiency ulcers. He comes weekly for compression wraps. He has no complaints today. He was unable to tolerate the Coflex 2 layer Last week so we will do the four press 4-layer compression. He has been using his lymphedema pumps daily. 5/18; patient presents for 2-week follow-up. He has no complaints or issues today. He has developed a new wound to the right foot on his fourth toe. He overall feels well and denies signs of infection. 6/1; patient presents for 2-week follow-up. He has no complaints or issues today. He denies signs of infection. 04/18/2021 upon evaluation today patient appears to be doing well with regard to his legs bilaterally. Family open wound is actually on the toe of his left foot everything else is completely closed which is great news. In general I am extremely pleased with where things stand at this point. The patient is also happy that things are doing so well. 05/02/2021 upon evaluation today patient's legs actually appear to be doing quite well today. Fortunately there does not appear to be any signs of active infection which is great and overall I am extremely pleased with where he stands today. The patient does not appear to have any evidence of active infection at this time which is also great  news. 05/09/2021 upon evaluation today patient appears to be doing a little bit more poorly in regard to his legs. Unfortunately he is having issues with some breakdown  and a blood blister on the left leg this is due to I believe honestly to how it was wrapped last week. Fortunately there does not appear to be any signs of infection but nonetheless this is still a concern to be honest. No fevers, chills, nausea, vomiting, or diarrhea. 05/16/2021 upon evaluation today patient appears to be doing significantly better as compared to last week. I am very pleased with where things stand today. There does not appear to be any signs of infection which is great news and overall very pleased with where we stand. No fevers, chills, nausea, vomiting, or diarrhea. 05/30/2021 upon evaluation today patient appears to be doing well with regard to his legs. He has been tolerating the dressing changes without complication. Fortunately there does not appear to be any signs of active infection which is great news and overall I am extremely pleased with where things stand today. No fevers, chills, nausea, vomiting, or diarrhea. 06/20/2021 upon evaluation today patient actually appears to be making good progress today and very pleased with what we are seeing. I think his legs are really maintaining. As long as we continue wrapping he seems to be doing excellent in my opinion. Fortunately there is no signs of active infection at this time. No fevers, chills, nausea, vomiting, or diarrhea. 07/11/2021 upon evaluation today patient actually appears to be making excellent progress at this time. Fortunately there does not appear to be any evidence of active infection which is great news and overall I am extremely pleased with where things stand today. No fevers, chills, nausea, vomiting, or diarrhea. 07/25/2021 upon evaluation today patient appears to be doing well currently in regard to his lower extremities. He has been making good  progress here and I do not see anything that is actually open significantly today this is great news. No fevers, chills, nausea, vomiting, or diarrhea. 08/08/2021 upon evaluation today patient appears to be doing well with regard to his wound. He has been tolerating the dressing changes without complication. With that being said unfortunately has a new area that opened up as far as his right posterior leg is concerned this was a blister he also has an area on the third toe right foot which also reopen. Fortunately there is no signs of active infection at this time which is great news. No fevers, chills, nausea, vomiting, or diarrhea. 10/17; patient came in today at his request initially for a nurse visit because it but out of concern for deterioration in both his lower legs and cellulitis I was asked to look at him. He comes in with increased swelling which he says started over the weekend he started to notice pain as well in his left medial ankle, right knee, left knee left dorsal foot. His wraps fell down contributing to some of this but he has not been using his compression pumps over the weekend for reasons that are not really clear. He comes in with multiple new wounds including the right posterior leg, right third toe, right fourth toe, left second toe left medial ankle left dorsal ankle and right anterior lower leg 09/19/2021 upon evaluation today patient does seem to be making improvements in general which is great news. I do not see any evidence of infection currently he does have some hypergranulation of the anterior portion of his ankle on the left side this is going require some debridement to pare this down and then subsequently silver nitrate probably due to the amount of bleeding that he is probably  going to experience. He is in agreement with this plan however. Electronic Signature(s) Signed: 09/19/2021 3:24:34 PM By: Lenda Kelp PA-C Entered By: Lenda Kelp on 09/19/2021  15:24:34 -------------------------------------------------------------------------------- Physical Exam Details Patient Name: Date of Service: [name], Zacarias J. 09/19/2021 10:30 A M Medical Record Number: [id number] Patient Account Number: [id number] Date of Birth/Sex: Treating RN: 1951-01-03 (70 y.o. [name] Primary Care Provider: Nicoletta Ba Other Clinician: Referring Provider: Treating Provider/Extender: Adele Dan in Treatment: 295 Constitutional Obese and well-hydrated in no acute distress. Respiratory normal breathing without difficulty. Psychiatric this patient is able to make decisions and demonstrates good insight into disease process. Alert and Oriented x 3. pleasant and cooperative. Notes Upon inspection patient's wounds appear to be doing quite well and in fact this seems much better than when Dr. Leanord Hawking saw him last which is great news as well. Electronic Signature(s) Signed: 09/19/2021 3:24:54 PM By: Lenda Kelp PA-C Entered By: Lenda Kelp on 09/19/2021 15:24:54 -------------------------------------------------------------------------------- Physician Orders Details Patient Name: Date of Service: [name], Luay J. 09/19/2021 10:30 A M Medical Record Number: [id number] Patient Account Number: [id number] Date of Birth/Sex: Treating RN: 02/15/1951 (70 y.o. [name], [name] Primary Care Provider: Nicoletta Ba Other Clinician: Referring Provider: Treating Provider/Extender: Adele Dan in Treatment: [phone] Verbal / Phone Orders: No Diagnosis Coding ICD-10 Coding Code Description L97.521 Non-pressure chronic ulcer of other 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 of skin L97.829 Non-pressure chronic ulcer of other part of left lower leg with unspecified severity L97.519 Non-pressure chronic ulcer of other part of right foot with unspecified severity L97.812 Non-pressure chronic ulcer of other  part of right lower leg with fat layer exposed I87.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 Leonard Schwartz Wednesday Return A Bathing/ Shower/ Hygiene May shower with protection but do not get wound dressing(s) wet. Edema Control - Lymphedema / SCD / Other Bilateral Lower Extremities Lymphedema Pumps. Use Lymphedema pumps on leg(s) 2-3 times a day for 45-60 minutes. If wearing any wraps or hose, do not remove them. Continue exercising as instructed. Elevate legs to the level of the heart or above for 30 minutes daily and/or when sitting, a frequency of: - throughout the day Avoid standing for long periods of time. Exercise regularly Wound Treatment Wound #198 - Lower Leg Wound Laterality: Right, Posterior Cleanser: Soap and Water 1 x Per Week Discharge Instructions: May shower and wash wound with dial antibacterial soap and water prior to dressing change. Cleanser: Wound Cleanser 1 x Per Week Discharge Instructions: Cleanse the wound with wound cleanser prior to applying a clean dressing using gauze sponges, not tissue or cotton balls. Peri-Wound Care: Zinc Oxide Ointment 30g tube 1 x Per Week Discharge Instructions: Apply Zinc Oxide to periwound with each dressing change Peri-Wound Care: Sween Lotion (Moisturizing lotion) 1 x Per Week Discharge Instructions: Apply moisturizing lotion as directed Prim Dressing: KerraCel Ag Gelling Fiber Dressing, 4x5 in (silver alginate) ary 1 x Per Week Discharge Instructions: Apply silver alginate to wound bed as instructed Secondary Dressing: ABD Pad, 8x10 1 x Per Week Discharge Instructions: Apply over primary  dressing as directed. Compression Wrap: FourPress (4 layer compression wrap) 1 x  Per Week Discharge Instructions: ****UNNA BOOT FIRST LAYER APPLIED TO UPPER PORTION OF LOWER LEG.*** Wound #199 - T Third oe Wound Laterality: Right Cleanser: Soap and Water 1 x Per Week Discharge Instructions: May shower and wash wound with dial antibacterial soap and water prior to dressing change. Cleanser: Wound Cleanser 1 x Per Week Discharge Instructions: Cleanse the wound with wound cleanser prior to applying a clean dressing using gauze sponges, not tissue or cotton balls. Peri-Wound Care: Zinc Oxide Ointment 30g tube 1 x Per Week Discharge Instructions: Apply Zinc Oxide to periwound with each dressing change Peri-Wound Care: Sween Lotion (Moisturizing lotion) 1 x Per Week Discharge Instructions: Apply moisturizing lotion as directed Prim Dressing: KerraCel Ag Gelling Fiber Dressing, 4x5 in (silver alginate) 1 x Per Week ary Discharge Instructions: Apply silver alginate to wound bed as instructed Secured With: Conforming Stretch Gauze Bandage, Sterile 2x75 (in/in) 1 x Per Week Discharge Instructions: Secure with stretch gauze as directed. Secured With: 17M Medipore Scientist, research (life sciences) Surgical T 2x10 (in/yd) 1 x Per Week ape Discharge Instructions: Secure with tape as directed. Wound #200 - Lower Leg Wound Laterality: Left, Medial Cleanser: Soap and Water 1 x Per Week Discharge Instructions: May shower and wash wound with dial antibacterial soap and water prior to dressing change. Cleanser: Wound Cleanser 1 x Per Week Discharge Instructions: Cleanse the wound with wound cleanser prior to applying a clean dressing using gauze sponges, not tissue or cotton balls. Peri-Wound Care: Zinc Oxide Ointment 30g tube 1 x Per Week Discharge Instructions: Apply Zinc Oxide to periwound with each dressing change Peri-Wound Care: Sween Lotion (Moisturizing lotion) 1 x Per Week Discharge Instructions: Apply moisturizing lotion  as directed Prim Dressing: KerraCel Ag Gelling Fiber Dressing, 4x5 in (silver alginate) 1 x Per Week ary Discharge Instructions: Apply silver alginate to wound bed as instructed Secondary Dressing: ABD Pad, 8x10 1 x Per Week Discharge Instructions: Apply over primary dressing as directed. Compression Wrap: FourPress (4 layer compression wrap) 1 x Per Week Discharge Instructions: ****UNNA BOOT FIRST LAYER APPLIED TO UPPER PORTION OF LOWER LEG.*** Wound #201 - Lower Leg Wound Laterality: Right, Anterior Cleanser: Soap and Water 1 x Per Week Discharge Instructions: May shower and wash wound with dial antibacterial soap and water prior to dressing change. Cleanser: Wound Cleanser 1 x Per Week Discharge Instructions: Cleanse the wound with wound cleanser prior to applying a clean dressing using gauze sponges, not tissue or cotton balls. Peri-Wound Care: Zinc Oxide Ointment 30g tube 1 x Per Week Discharge Instructions: Apply Zinc Oxide to periwound with each dressing change Peri-Wound Care: Sween Lotion (Moisturizing lotion) 1 x Per Week Discharge Instructions: Apply moisturizing lotion as directed Prim Dressing: KerraCel Ag Gelling Fiber Dressing, 4x5 in (silver alginate) 1 x Per Week ary Discharge Instructions: Apply silver alginate to wound bed as instructed Secondary Dressing: ABD Pad, 8x10 1 x Per Week Discharge Instructions: Apply over primary dressing as directed. Compression Wrap: FourPress (4 layer compression wrap) 1 x Per Week Discharge Instructions: ****UNNA BOOT FIRST LAYER APPLIED TO UPPER PORTION OF LOWER LEG.*** Wound #202 - T Fourth oe Wound Laterality: Right Cleanser: Soap and Water 1 x Per Week Discharge Instructions: May shower and wash wound with dial antibacterial soap and water prior to dressing change. Cleanser: Wound Cleanser 1 x Per Week Discharge Instructions: Cleanse the wound with wound cleanser prior to applying a clean dressing using gauze sponges, not tissue or  cotton balls. Peri-Wound Care: Zinc Oxide Ointment 30g tube 1 x Per Week  Discharge Instructions: Apply Zinc Oxide to periwound with each dressing change Peri-Wound Care: Sween Lotion (Moisturizing lotion) 1 x Per Week Discharge Instructions: Apply moisturizing lotion as directed Prim Dressing: KerraCel Ag Gelling Fiber Dressing, 4x5 in (silver alginate) 1 x Per Week ary Discharge Instructions: Apply silver alginate to wound bed as instructed Secured With: Conforming Stretch Gauze Bandage, Sterile 2x75 (in/in) 1 x Per Week Discharge Instructions: Secure with stretch gauze as directed. Secured With: 34M Medipore Scientist, research (life sciences) Surgical T 2x10 (in/yd) 1 x Per Week ape Discharge Instructions: Secure with tape as directed. Wound #203 - Ankle Wound Laterality: Left, Anterior Cleanser: Soap and Water 1 x Per Week Discharge Instructions: May shower and wash wound with dial antibacterial soap and water prior to dressing change. Cleanser: Wound Cleanser 1 x Per Week Discharge Instructions: Cleanse the wound with wound cleanser prior to applying a clean dressing using gauze sponges, not tissue or cotton balls. Peri-Wound Care: Zinc Oxide Ointment 30g tube 1 x Per Week Discharge Instructions: Apply Zinc Oxide to periwound with each dressing change Peri-Wound Care: Sween Lotion (Moisturizing lotion) 1 x Per Week Discharge Instructions: Apply moisturizing lotion as directed Prim Dressing: KerraCel Ag Gelling Fiber Dressing, 4x5 in (silver alginate) 1 x Per Week ary Discharge Instructions: Apply silver alginate to wound bed as instructed Secondary Dressing: ABD Pad, 8x10 1 x Per Week Discharge Instructions: Apply over primary dressing as directed. Compression Wrap: FourPress (4 layer compression wrap) 1 x Per Week Discharge Instructions: ****UNNA BOOT FIRST LAYER APPLIED TO UPPER PORTION OF LOWER LEG.*** Electronic Signature(s) Signed: 09/19/2021 4:59:49 PM By: Lenda Kelp PA-C Signed: 09/19/2021  5:43:37 PM By: Shawn Stall RN, BSN Entered By: Shawn Stall on 09/19/2021 11:55:33 -------------------------------------------------------------------------------- Problem List Details Patient Name: Date of Service: [name], Gared J. 09/19/2021 10:30 A M Medical Record Number: [id number] Patient Account Number: [id number] Date of Birth/Sex: Treating RN: 1951/03/29 (70 y.o. 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 04/27/2018 No Yes skin L97.829 Non-pressure chronic ulcer of other part of left lower leg with unspecified 03/21/2021 No Yes severity L97.519 Non-pressure chronic ulcer of other part of right foot with unspecified severity 03/21/2021 No Yes L97.812 Non-pressure chronic ulcer of other part of right lower leg with fat layer 08/08/2021 No Yes exposed I87.333 Chronic venous hypertension (idiopathic) with ulcer and inflammation of 01/22/2016 No Yes bilateral lower extremity L03.116 Cellulitis of left lower limb 08/20/2021 No Yes I89.0 Lymphedema, not elsewhere classified 01/22/2016 No Yes E11.40 Type 2 diabetes mellitus with diabetic neuropathy, unspecified 01/22/2016 No Yes E11.622 Type 2 diabetes mellitus with other skin ulcer 01/22/2016 No Yes L03.115 Cellulitis of right lower limb 12/22/2017 No Yes Inactive Problems ICD-10 Code Description Active Date Inactive Date 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 06/30/2018 06/30/2018 L97.511 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 of skin 06/30/2018 06/30/2018 Resolved Problems ICD-10 Code Description Active Date Resolved Date L97.211 Non-pressure chronic ulcer of  right calf limited to breakdown of skin 06/30/2018 06/30/2018 L97.221 Non-pressure chronic ulcer of left calf limited to breakdown of skin 09/30/2016 09/30/2016 L03.116 Cellulitis of left lower limb 04/01/2017 04/01/2017 L97.211 Non-pressure chronic ulcer of right calf limited to breakdown of skin 06/30/2017 06/30/2017 Electronic Signature(s) Signed: 09/19/2021 10:59:46 AM By:  Linwood Dibbles, Nuri Larmer PA-C Entered By: Lenda Kelp on 09/19/2021 10:59:45 -------------------------------------------------------------------------------- Progress Note Details Patient Name: Date of Service: CO UNKNOWN, SCHLEYER 09/19/2021 10:30 A M Medical Record Number: [id number] Patient Account Number: [id number] Date of Birth/Sex: Treating RN: [birth year] (70 y.o. [name] Primary Care Provider: Nicoletta Ba Other Clinician: Referring Provider: Treating Provider/Extender: Adele Dan in Treatment: [id number] Subjective Chief Complaint Information obtained from Patient Patient is here for evaluation of venous/lymphedema ulcers History of Present Illness (HPI) Referred by PCP for consultation. Patient has long standing history of BLE venous stasis, no prior ulcerations. At beginning of month, developed cellulitis and weeping. Received IM Rocephin followed by Keflex and resolved. Wears compression stocking, appr 6 months old. Not sure strength. No present drainage. 01/22/16 this is a patient who is a type II diabetic on insulin. 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 He tells me he religiously wears pressure stockings of uncertain strength. He was here with weeping edema about 8 months ago but did not have an open wound. Roughly a month ago he had a reopening on his bilateral legs. He is been using bandages and Neosporin. He does not complain of pain. 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 Last BUN/creatinine I have is from 11/20/15 at 13 and 1.0 respectively 01/29/16; patient arrives today having tolerated the Profore wrap. He brought in his stockings and these are 18 mmHg stockings he bought from [employer]. The compression here is likely inadequate. He does not complain of pain or excessive drainage she has no systemic symptoms. The wound on the right looks improved as does the one on the left although one on the left is more substantial with still tissue at risk below the actual wound area on the bilateral posterior calf 02/05/16; patient arrives with poor edema control. He states that we did put a 4 layer compression on it last week. No weight appear 5 this. 02/12/16; the area on the posterior right Has healed. The left Has a substantial wound that has necrotic surface eschar that requires a debridement with a curette. 02/16/16;the patient called or a Nurse visit secondary to increased swelling. He had been in earlier in the week with his right leg healed. He was transitioned to is on pressure stocking on the right leg with the only open wound on the left, a substantial area on the left posterior calf. Note he has a history of severe lower extremity edema, he has a history of chronic atrial fibrillation but not heart failure per my notes but I'll need to research this. He is not complaining of chest pain shortness of breath or orthopnea. The intake nurse noted blisters on the previously closed right leg 02/19/16; this is the patient's regular visit day. I see him on Friday with escalating edema new wounds on the right leg and clear signs of at least right ventricular heart failure. I increased his Lasix to 40 twice a day. He is returning currently in follow-up. States he is noticed a decrease in that the edema 02/26/16 patient's legs have much less edema. There is nothing really open on the right leg. The left leg has improved condition of  the large superficial wound on the posterior left leg 03/04/16; edema control is very much better. The patient's right leg wounds have healed. On the left leg he continues  to have severe venous inflammation on the posterior aspect of the left leg. There is no tenderness and I don't think any of this is cellulitis. 03/11/16; patient's right leg is married healed and he is in his own stocking. The patient's left leg has deteriorated somewhat. There is a lot of erythema around the wound on the posterior left leg. There is also a significant rim of erythema posteriorly just above where the wrap would've ended there is a new wound in this location and a lot of tenderness. Can't rule out cellulitis in this area. 03/15/16; patient's right leg remains healed and he is in his own stocking. The patient's left leg is much better than last review. His major wound on the posterior aspect of his left Is almost fully epithelialized. He has 3 small injuries from the wraps. Really. Erythema seems a lot better on antibiotics 03/18/16; right leg remains healed and he is in his own stocking. The patient's left leg is much better. The area on the posterior aspect of the left calf is fully epithelialized. His 3 small injuries which were wrap injuries on the left are improved only one seems still open his erythema has resolved 03/25/16; patient's right leg remains healed and he is in his own stocking. There is no open area today on the left leg posterior leg is completely closed up. His wrap injuries at the superior aspect of his leg are also resolved. He looks as though he has some irritation on the dorsal ankle but this is fully epithelialized without evidence of infection. 03/28/16; we discharged this patient on Monday. Transitioned him into his own stocking. There were problems almost immediately with uncontrolled swelling weeping edema multiple some of which have opened. He does not feel systemically unwell in particular no  chest pain no shortness of breath and he does not feel 04/08/16; the edema is under better control with the Profore light wrap but he still has pitting edema. There is one large wound anteriorly 2 on the medial aspect of his left leg and 3 small areas on the superior posterior calf. Drainage is not excessive he is tolerating a Profore light well 04/15/16; put a Profore wrap on him last week. This is controlled is edema however he had a lot of pain on his left anterior foot most of his wounds are healed 04/22/16 once again the patient has denuded areas on the left anterior foot which he states are because his wrap slips up word. He saw his primary physician today is on Lasix 40 twice a day and states that he his weight is down 20 pounds over the last 3 months. 04/29/16: Much improved. left anterior foot much improved. He is now on Lasix 80 mg per day. Much improved edema control 05/06/16; I was hoping to be able to discharge him today however once again he has blisters at a low level of where the compression was placed last week mostly on his left lateral but also his left medial leg and a small area on the anterior part of the left foot. 05/09/16; apparently the patient went home after his appointment on 7/4 later in the evening developing pain in his upper medial thigh together with subjective fever and chills although his temperature was not taken. The pain was so intense he felt he would probably have to call 911. However he then remembered that he had leftover doxycycline from a previous round of antibiotics and took these. By the next morning he felt a lot  better. He called and spoke to one of our nurses and I approved doxycycline over the phone thinking that this was in relation to the wounds we had previously seen although they were definitely were not. The patient feels a lot better old fever no chills he is still working. Blood sugars are reasonably controlled 05/13/16; patient is back in for review of  his cellulitis on his anterior medial upper thigh. He is taking doxycycline this is a lot better. Culture I did of the nodular area on the dorsal aspect of his foot grew MRSA this also looks a lot better. 05/20/16; the patient is cellulitis on the medial upper thigh has resolved. All of his wound areas including the left anterior foot, areas on the medial aspect of the left calf and the lateral aspect of the calf at all resolved. He has a new blister on the left dorsal foot at the level of the fourth toe this was excised. No evidence of infection 05/27/16; patient continues to complain weeping edema. He has new blisterlike wounds on the left anterior lateral and posterior lateral calf at the top of his wrap levels. The area on his left anterior foot appears better. He is not complaining of fever, pain or pruritus in his feet. 05/30/16; the patient's blisters on his left anterior leg posterior calf all look improved. He did not increase the Lasix 100 mg as I suggested because he was going to run out of his 40 mg tablets. He is still having weeping edema of his toes 06/03/16; I renewed his Lasix at 80 mg once a day as he was about to run out when I last saw him. He is on 80 mg of Lasix now. I have asked him to cut down on the excessive amount of water he was drinking and asked him to drink according to his thirst mechanisms 06/12/2016 -- was seen 2 days ago and was supposed to wear his compression stockings at home but he is developed lymphedema and superficial blisters on the left lower extremity and hence came in for a review 06/24/16; the remaining wound is on his left anterior leg. He still has edema coming from between his toes. There is lymphedema here however his edema is generally better than when I last saw this. He has a history of atrial fibrillation but does not have a known history of congestive heart failure nevertheless I think he probably has this at least on a diastolic basis. 07/01/16 I  reviewed his echocardiogram from January 2017. This was essentially normal. He did not have LVH, EF of 55-60%. His right ventricular function was normal although he did have trivial tricuspid and pulmonic regurgitation. This is not audible on exam however. I increased his Lasix to do massive edema in his legs well above his knees I think in early July. He was also drinking an excessive amount of water at the time. 07/15/16; missed his appointment last week because of the Labor Day holiday on Monday. He could not get another appointment later in the week. Started to feel the wrap digging in superiorly so we remove the top half and the bottom half of his wrap. He has extensive erythema and blistering superiorly in the left leg. Very tender. Very swollen. Edema in his foot with leaking edema fluid. He has not been systemically unwell 07/22/16; the area on the left leg laterally required some debridement. The medial wounds look more stable. His wrap injury wounds appear to have healed. Edema and his  foot is better, weeping edema is also better. He tells me he is meeting with the supplier of the external compression pumps at work 08/05/16; the patient was on vacation last week in [facility]. His wrap is been on for an extended period of time. Also over the weekend he developed an extensive area of tender erythema across his anterior medial thigh. He took to doxycycline yesterday that he had leftover from a previous prescription. The patient complains of weeping edema coming out of his toes 08/08/16; I saw this patient on 10/2. He was tender across his anterior thigh. I put him on doxycycline. He returns today in follow-up. He does not have any open wounds on his lower leg, he still has edema weeping into his toes. 08/12/16; patient was seen back urgently today to follow-up for his extensive left thigh cellulitis/erysipelas. He comes back with a lot less swelling and erythema pain is much better. I believe I  gave him Augmentin and Cipro. His wrap was cut down as he stated a roll down his legs. He developed blistering above the level of the wrap that remained. He has 2 open blisters and 1 intact. 08/19/16; patient is been doing his primary doctor who is increased his Lasix from 40-80 once a day or 80 already has less edema. Cellulitis has remained improved in the left thigh. 2 open areas on the posterior left calf 08/26/16; he returns today having new open blisters on the anterior part of his left leg. He has his compression pumps but is not yet been shown how to use some vital representative from the supplier. 09/02/16 patient returns today with no open wounds on the left leg. Some maceration in his plantar toes 09/10/2016 -- Dr. Leanord Hawking had recently discharged him on 09/02/2016 and he has come right back with redness swelling and some open ulcers on his left lower extremity. He says this was caused by trying to apply his compression stockings and he's been unable to use this and has not been able to use his lymphedema pumps. He had some doxycycline leftover and he has started on this a few days ago. 09/16/16; there are no open wounds on his leg on the left and no evidence of cellulitis. 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 He does not complain of symptoms here. I am not clear use using his external compression pumps. 09/23/16; I have not seen this patient in 2 weeks. He canceled his appointment 10 days ago as he was going on vacation. He tells me that on Monday he noticed a large area on his posterior left leg which is been draining copiously and is reopened into a large wound. He is been using ABDs and the external part of his juxtalite, according to our nurse this was not on properly. 10/07/16; Still a substantial area on the posterior left leg. Using silver alginate 10/14/16; in general better although there is still open area which looks  healthy. Still using silver alginate. He reminds me that this happen before he left for [employer] T oday while he was showering in the morning. He had been using his juxtalite's 10/21/16; the area on his posterior left leg is fully epithelialized. However he arrives today with a large area of tender erythema in his medial and posterior left thigh just above the knee. I have marked the area. Once again he is reluctant to consider hospitalization. I treated him with oral antibiotics in the  past for a similar situation with resolution I think with doxycycline however this area it seems more extensive to me. He is not complaining of fever but does have chills and says states he is thirsty. His blood sugar today was in the 140s at home 10/25/16 the area on his posterior left leg is fully epithelialized although there is still some weeping edema. The large area of tenderness and erythema in his medial and posterior left thigh is a lot less tender although there is still a lot of swelling in this thigh. He states he feels a lot better. He is on doxycycline and Augmentin that I started last week. This will continued until Tuesday, December 26. I have ordered a duplex ultrasound of the left thigh rule out DVT whether there is an abscess something that would need to be drained I would also like to know. 11/01/16; he still has weeping edema from a not fully epithelialized area on his left posterior calf. Most of the rest of this looks a lot better. He has completed his antibiotics. His thigh is a lot better. Duplex ultrasound did not show a DVT in the thigh 11/08/16; he comes in today with more Denuded surface epithelium from the posterior aspect of his calf. There is no real evidence of cellulitis. The superior aspect of his wrap appears to have put quite an indentation in his leg just below the knee and this may have contributed. He does not complain of pain or fever. We have been using silver alginate as the  primary dressing. The area of cellulitis in the right thigh has totally resolved. He has been using his compression stockings once a week 11/15/16; the patient arrives today with more loss of epithelium from the posterior aspect of his left calf. He now has a fairly substantial wound in this area. The reason behind this deterioration isn't exactly clear although his edema is not well controlled. He states he feels he is generally more swollen systemically. He is not complaining of chest pain shortness of breath fever. T me he has an appointment with his primary physician in early February. He is on 80 mg of oral ells Lasix a day. He claims compliance with the external compression pumps. He is not having any pain in his legs similar to what he has with his recurrent cellulitis 11/22/16; the patient arrives a follow-up of his large area on his left lateral calf. This looks somewhat better today. He came in earlier in the week for a dressing change since I saw him a week ago. He is not complaining of any pain no shortness of breath no chest pain 11/28/16; the patient arrives for follow-up of his large area on the left lateral calf this does not look better. In fact it is larger weeping edema. The surface of the wound does not look too bad. We have been using silver alginate although I'm not certain that this is a dressing issue. 12/05/16; again the patient follows up for a large wound on the left lateral and left posterior calf this does not look better. There continues to be weeping edema necrotic surface tissue. More worrisome than this once again there is erythema below the wound involving the distal Achilles and heel suggestive of cellulitis. He is on his feet working most of the day of this is not going well. We are changing his dressing twice a week to facilitate the drainage. 12/12/16; not much change in the overall dimensions of the large area on  the left posterior calf. This is very inflamed looking. I  gave him an. Doxycycline last week does not really seem to have helped. He found the wrap very painful indeed it seems to of dog into his legs superiorly and perhaps around the heel. He came in early today because the drainage had soaked through his dressings. 12/19/16- patient arrives for follow-up evaluation of his left lower extremity ulcers. He states that he is using his lymphedema pumps once daily when there is "no drainage". He admits to not using his lipedema pumps while under current treatment. His blood sugars have been consistently between 150-200. 12/26/16; the patient is not using his compression pumps at home because of the wetness on his feet. I've advised him that I think it's important for him to use this daily. He finds his feet too wet, he can put a plastic bag over his legs while he is in the pumps. Otherwise I think will be in a vicious circle. We are using silver alginate to the major area on his left posterior calf 01/02/17; the patient's posterior left leg has further of all into 3 open wounds. All of them covered with a necrotic surface. He claims to be using his compression pumps once a day. His edema control is marginal. Continue with silver alginate 01/10/17; the patient's left posterior leg actually looks somewhat better. There is less edema, less erythema. Still has 3 open areas covered with a necrotic surface requiring debridement. He claims to be using his compression pumps once a day his edema control is better 01/17/17; the patient's left posterior calf look better last week when I saw him and his wrap was changed 2 days ago. He has noted increasing pain in the left heel and arrives today with much larger wounds extensive erythema extending down into the entire heel area especially tender medially. He is not systemically unwell CBGs have been controlled no fever. Our intake nurse showed me limegreen drainage on his AVD pads. 01/24/17; his usual this patient responds nicely to  antibiotics last week giving him Levaquin for presumed Pseudomonas. The whole entire posterior part of his leg is much better much less inflamed and in the case of his Achilles heel area much less tender. He has also had some epithelialization posteriorly there are still open areas here and still draining but overall considerably better 01/31/17- He has continue to tolerate the compression wraps. he states that he continues to use the lymphedema pumps daily, and can increase to twice daily on the weekends. He is voicing no complaints or concerns regarding his LLE ulcers 02/07/17-he is here for follow-up evaluation. He states that he noted some erythema to the left medial and anterior thigh, which he states is new as of yesterday. He is concerned about recurrent cellulitis. He states his blood sugars have been slightly elevated, this morning in the 180s 02/14/17; he is here for follow-up evaluation. When he was last here there was erythema superiorly from his posterior wound in his anterior thigh. He was prescribed Levaquin however a culture of the wound surface grew MRSA over the phone I changed him to doxycycline on Monday and things seem to be a lot better. 02/24/17; patient missed his appointment on Friday therefore we changed his nurse visit into a physician visit today. Still using silver alginate on the large area of the posterior left thigh. He isn't new area on the dorsal left second toe 03/03/17; actually better today although he admits he has not used his  external compression pumps in the last 2 days or so because of work responsibilities over the weekend. 03/10/17; continued improvement. External compression pumps once a day almost all of his wounds have closed on the posterior left calf. Better edema control 03/17/17; in general improved. He still has 3 small open areas on the lateral aspect of his left leg however most of the area on the posterior part of his leg is epithelialized. He has better  edema control. He has an ABD pad under his stocking on the right anterior lower leg although he did not let us look at that today. 03/24/17; patient arrives back in clinic today with no open areas however there are areas on the posterior left calf and anterior left calf that are less than 100% epithelialized. His edema is well controlled in the left lower leg. There is some pitting edema probably lymphedema in the left upper thigh. He uses compression pumps at home once per day. I tried to get him to do this twice a day although he is very reticent. 04/01/2017 -- for the last 2 days he's had significant redness, tenderness and weeping and came in for an urgent visit today. 04/07/17; patient still has 6 more days of doxycycline. He was seen by Dr. Meyer Russel last Wednesday for cellulitis involving the posterior aspect, lateral aspect of his Involving his heel. For the most part he is better there is less erythema and less weeping. He has been on his feet for 12 hours o2 over the weekend. Using his compression pumps once a day 04/14/17 arrives today with continued improvement. Only one area on the posterior left calf that is not fully epithelialized. He has intense bilateral venous inflammation associated with his chronic venous insufficiency disease and secondary lymphedema. We have been using silver alginate to the left posterior calf wound In passing he tells [place] today that the right leg but we have not seen in quite some time has an open area on it but he doesn't want [place] to look at this today states he will show this to [place] next week. 04/21/17; there is no open area on his left leg although he still reports some weeping edema. He showed [place] his right leg today which is the first time we've seen this leg in a long time. He has a large area of open wound on the right leg anteriorly healthy granulation. Quite a bit of swelling in the right leg and some degree of venous inflammation. He told us about the right leg  in passing last week but states that deterioration in the right leg really only happened over the weekend 04/28/17; there is no open area on the left leg although there is an irritated part on the posterior which is like a wrap injury. The wound on the right leg which was new from last week at least to [place] is a lot better. 05/05/17; still no open area on the left leg. Patient is using his new compression stocking which seems to be doing a good job of controlling the edema. He states he is using his compression pumps once per day. The right leg still has an open wound although it is better in terms of surface area. Required debridement. A lot of pain in the posterior right Achilles marked tenderness. Usually this type of presentation this patient gives concern for an active cellulitis 05/12/17; patient arrives today with his major wound from last week on the right lateral leg somewhat better. Still requiring debridement. He  was using his compression stocking on the left leg however that is reopened with superficial wounds anteriorly he did not have an open wound on this leg previously. He is still using his juxta light's once daily at night. He cannot find the time to do this in the morning as he has to be at work by 7 AM 05/19/17; right lateral leg wound looks improved. No debridement required. The concerning area is on the left posterior leg which appears to almost have a subcutaneous hemorrhagic component to it. We've been using silver alginate to all the wounds 05/26/17; the right lateral leg wound continues to look improved. However the area on the left posterior calf is a tightly adherent surface. [place] using silver alginate. Because of the weeping edema in his legs there is very little good alternatives. 06/02/17; the patient left here last week looking quite good. Major wound on the left posterior calf and a small one on the right lateral calf. Both of these look satisfactory. He tells me that by  Wednesday he had noted increased pain in the left leg and drainage. He called on Thursday and Friday to get an appointment here but we were blocked. He did not go to urgent care or his primary physician. He thinks he had a fever on Thursday but did not actually take his temperature. He has not been using his compression pumps on the left leg because of pain. I advised him to go to the emergency room today for IV antibiotics for stents of left leg cellulitis but he has refused I have asked him to take 2 days off work to keep his leg elevated and he has refused this as well. In view of this I'm going to call him and Augmentin and doxycycline. 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 However after a detailed discussion regarding the risk benefits and alternatives he refuses to get admitted to the hospital. With no other recourse I will continue him on oral antibiotics as before and hopefully he'll have his infectious disease consultation this week. 06/16/2017 -- the patient was seen today by the nurse practitioner at infectious disease Ms. Dixon. 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 He has also been advise Lotrimin cream locally. she also had wise IV antibiotics for his condition if it flares up 06/23/17; patient arrives today with drainage bilaterally although the remaining wound on the left posterior calf after cleaning up today "highlighter yellow drainage" did not look too bad. Unfortunately he has had breakdown on the right anterior leg [previously this leg had not been open  and he is using a black stocking] he went to see infectious disease and is been put on clindamycin 150 mg daily, I did not verify the dose although I'm not familiar with using clindamycin in this dosing range, perhaps for prophylaxisoo 06/27/17; I brought this patient back today to follow-up on the wound deterioration on the right lower leg together with surrounding cellulitis. I started him on doxycycline 4 days ago. This area looks better however he comes in today with  intense cellulitis on the medial part of his left thigh. This is not have a wound in this area. Extremely tender. We've been using silver alginate to the wounds on the right lower leg left lower leg with bilateral 4 layer compression he is using his external compression pumps once a day 07/04/17; patient's left medial thigh cellulitis looks better. He has not been using his compression pumps as his insert said it was contraindicated with cellulitis. His right leg continues to make improvements all the wounds are still open. We only have one remaining wound on the left posterior calf. Using silver alginate to all open areas. He is on doxycycline which I started a week ago and should be finishing I gave him Augmentin after Thursday's visit for the severe cellulitis on the left medial thigh which fortunately looks better 07/14/17; the patient's left medial thigh cellulitis has resolved. The cellulitis in his right lower calf on the right also looks better. All of his wounds are stable to improved we've been using silver alginate he has completed the antibiotics I have given him. He has clindamycin 150 mg once a day prescribed by infectious disease for prophylaxis, I've advised him to start this now. We have been using bilateral Unna boots over silver alginate to the wound areas 07/21/17; the patient is been to see infectious disease who noted his recurrent problems with cellulitis. He was not able to tolerate prophylactic  clindamycin therefore he is on amoxicillin 500 twice a day. He also had a second daily dose of Lasix added By Dr. Oneta Rack but he is not taking this. Nor is he being completely compliant with his compression pumps a especially not this week. He has 2 remaining wounds one on the right posterior lateral lower leg and one on the left posterior medial lower leg. 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 The patient has Unna boots bilaterally. 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 He has bilateral Unna boots for compression and silver alginate to his wounds. Arrives today with his legs looking as good as I have seen him in quite some time. Not surprisingly his wounds look better as well with improvement on the right lateral leg venous insufficiency wound and also the left medial leg. He is still using the compression pumps once a day 08/11/17; both legs appear to be doing better wounds on the right lateral and left medial legs look better. Skin on the right leg quite good. He is been using silver alginate as the primary dressing. I'm going to use Anasept gel calcium alginate and maintain all the secondary dressings 08/18/17; the patient continues to actually do quite well. The area on his right lateral leg is just about closed the left medial also looks better although it is still moist in this area. His edema is well controlled we have been using Anasept gel with calcium alginate and the usual secondary dressings, 4 layer compression and once daily use of his compression pumps "always been able to manage 09/01/17; the patient continues to do reasonably well in spite of his trip to [place]. The area on the right lateral leg is epithelialized. Left is much better but still  open. He has more edema and more chronic erythema on the left leg [venous inflammation] 09/08/17; he  arrives today with no open wound on the right lateral leg and decently controlled edema. Unfortunately his left leg is not nearly as in his good situation as last week.he apparently had increasing edema starting on Saturday. He edema soaked through into his foot so used a plastic bag to walk around his home. The area on the medial right leg which was his open area is about the same however he has lost surface epithelium on the left lateral which is new and he has significant pain in the Achilles area of the left foot. He is already on amoxicillin chronically for prophylaxis of cellulitis in the left leg 09/15/17; he is completed a week of doxycycline and the cellulitis in the left posterior leg and Achilles area is as usual improved. He still has a lot of edema and fluid soaking through his dressings. There is no open wound on the right leg. He saw infectious disease NP today 09/22/17;As usual 1 we transition him from our compression wraps to his stockings things did not go well. He has several small open areas on the right leg. He states this was caused by the compression wrap on his skin although he did not wear this with the stockings over them. He has several superficial areas on the left leg medially laterally posteriorly. He does not have any evidence of active cellulitis especially involving the left Achilles The patient is traveling from [facility] Saturday going to [facility]. He states he isn't attempting to get an appointment with a heel objects wound center there to change his dressings. I am not completely certain whether this will work 10/06/17; the patient came in on Friday for a nurse visit and the nurse reported that his legs actually look quite good. He arrives in clinic today for his regular follow-up visit. He has a new wound on his left third toe over the PIP probably caused by  friction with his footwear. He has small areas on the left leg and a very superficial but epithelialized area on the right anterior lateral lower leg. Other than that his legs look as good as I've seen him in quite some time. We have been using silver alginate Review of systems; no chest pain no shortness of breath other than this a 10 point review of systems negative 10/20/17; seen by Dr. Meyer Russel last week. He had taken some antibiotics [doxycycline] that he had left over. Dr. Meyer Russel thought he had candida infection and declined to give him further antibiotics. He has a small wound remaining on the right lateral leg several areas on the left leg including a larger area on the left posterior several left medial and anterior and a small wound on the left lateral. The area on the left dorsal third toe looks a lot better. ROS; Gen.; no fever, respiratory no cough no sputum Cardiac no chest pain other than this 10 point review of system is negative 10/30/17; patient arrives today having fallen in the bathtub 3 days ago. It took him a while to get up. He has pain and maceration in the wounds on his left leg which have deteriorated. He has not been using his pumps he also has some maceration on the right lateral leg. 11/03/17; patient continues to have weeping edema especially in the left leg. This saturates his dressings which were just put on on 12/27. As usual the doxycycline seems to take care of the cellulitis on his lower leg. He is not complaining of fever, chills, or other systemic  symptoms. He states his leg feels a lot better on the doxycycline I gave him empirically. He also apparently gets injections at his primary doctor's officeo Rocephin for cellulitis prophylaxis. I didn't ask him about his compression pump compliance today I think that's probably marginal. Arrives in the clinic with all of his dressings primary and secondary macerated full of fluid and he has bilateral edema 11/10/17; the  patient's right leg looks some better although there is still a cluster of wounds on the right lateral. The left leg is inflamed with almost circumferential skin loss medially to laterally although we are still maintaining anteriorly. He does not have overt cellulitis there is a lot of drainage. He is not using compression pumps. We have been using silver alginate to the wound areas, there are not a lot of options here 11/17/17; the patient's right leg continues to be stable although there is still open wounds, better than last week. The inflammation in the left leg is better. Still loss of surface layer epithelium especially posteriorly. There is no overt cellulitis in the amount of edema and his left leg is really quite good, tells me he is using his compression pumps once a day. 11/24/17; patient's right leg has a small superficial wound laterally this continues to improve. The inflammation in the left leg is still improving however we have continuous surface layer epithelial loss posteriorly. There is no overt cellulitis in the amount of edema in both legs is really quite good. He states he is using his compression pumps on the left leg once a day for 5 out of 7 days 12/01/17; very small superficial areas on the right lateral leg continue to improve. Edema control in both legs is better today. He has continued loss of surface epithelialization and left posterior calf although I think this is better. We have been using silver alginate with large number of absorptive secondary dressings 4 layer on the left Unna boot on the right at his request. He tells me he is using his compression pumps once a day 12/08/17; he has no open area on the right leg is edema control is good here. ooOn the left leg however he has marked erythema and tenderness breakdown of skin. He has what appears to be a wrap injury just distal to the popliteal fossa. This is the pattern of his recurrent cellulitis area and he apparently  received penicillin at his primary physician's office really worked in my view but usually response to doxycycline given it to him several times in the past 12/15/17; the patient had already deteriorated last Friday when he came in for his nurse check. There was swelling erythema and breakdown in the right leg. He has much worse skin breakdown in the left leg as well multiple open areas medially and posteriorly as well as laterally. He tells me he has been using his compression pumps but tells me he feels that the drainage out of his leg is worse when he uses a compression pumps. T be fair to him he is been saying this o for a while however I don't know that I have really been listening to this. I wonder if the compression pumps are working properly 12/22/17;. Once again he arrives with severe erythema, weeping edema from the left greater than right leg. Noncompliance with compression pumps. New this visit he is complaining of pain on the lateral aspect of the right leg and the medial aspect of his right thigh. He apparently saw his cardiologist Dr.  Hilty who was ordered an echocardiogram area and I think this is a step in the right direction 12/25/17; started his doxycycline Monday night. There is still intense erythema of the right leg especially in the anterior thigh although there is less tenderness. The erythema around the wound on the right lateral calf also is less tender. He still complaining of pain in the left heel. His wounds are about the same right lateral left medial left lateral. Superficial but certainly not close to closure. He denies being systemically unwell no fever chills no abdominal pain no diarrhea 12/29/17; back in follow-up of his extensive right calf and right thigh cellulitis. I added amoxicillin to cover possible doxycycline resistant strep. This seems to of done the trick he is in much less pain there is much less erythema and swelling. He has his echocardiogram at 11:00 this  morning. X-ray of the left heel was also negative. 01/05/18; the patient arrived with his edema under much better control. Now that he is retired he is able to use his compression pumps daily and sometimes twice a day per the patient. He has a wound on the right leg the lateral wound looks better. Area on the left leg also looks a lot better. He has no evidence of cellulitis in his bilateral thighs I had a quick peak at his echocardiogram. He is in normal ejection fraction and normal left ventricular function. He has moderate pulmonary hypertension moderately reduced right ventricular function. One would have to wonder about chronic sleep apnea although he says he doesn't snore. He'll review the echocardiogram with his cardiologist. 01/12/18; the patient arrives with the edema in both legs under exemplary control. He is using his compression pumps daily and sometimes twice daily. His wound on the right lateral leg is just about closed. He still has some weeping areas on the posterior left calf and lateral left calf although everything is just about closed here as well. I have spoken with Aldean Baker who is the patient's nurse practitioner and infectious disease. She was concerned that the patient had not understood that the parenteral penicillin injections he was receiving for cellulitis prophylaxis was actually benefiting him. I don't think the patient actually saw that I would tend to agree we were certainly dealing with less infections although he had a serious one last month. 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 He is healed. He'll be placed in juxtalite compression wraps and increase his lymphedema pumps to twice daily. We will follow up again next week to ensure there are no issues with the new regiment. 01/20/18-he is here for evaluation of bilateral lower extremity weeping edema. Yesterday he was placed in compression wrap to the right lower extremity  and compression stocking to left lower shrubbery. He states he uses lymphedema pumps last night and again this morning and noted a blister to the left lower extremity. On exam he was noted to have drainage to the right lower extremity. He will be placed in Unna boots bilaterally and follow-up next week 01/26/18; patient was actually discharged a week ago to his own juxta light stockings only to return the next day with bilateral lower extremity weeping edema.he was placed in bilateral Unna boots. He arrives today with pain in the back of his left leg. There is no open area on the right leg however there is a linear/wrap injury on the left leg and weeping edema on the left leg posteriorly. I spoke with infectious disease  about 10 days ago. They were disappointed that the patient elected to discontinue prophylactic intramuscular penicillin shots as they felt it was particularly beneficial in reducing the frequency of his cellulitis. I discussed this with the patient today. He does not share this view. He'll definitely need antibiotics today. Finally he is traveling to [place] and trauma leaving this Saturday and returning a week later and he does not travel with his pumps. He is going by car 01/30/18; patient was seen 4 days ago and brought back in today for review of cellulitis in the left leg posteriorly. I put him on amoxicillin this really hasn't helped as much as I might like. He is also worried because he is traveling to [facility] by car. Finally we will be rewrapping him. There is no open area on the right leg over his left leg has multiple weeping areas as usual 02/09/18; The same wrap on for 10 days. He did not pick up the last doxycycline I prescribed for him. He apparently took 4 days worth he already had. There is nothing open on his right leg and the edema control is really quite good. He's had damage in the left leg medially and laterally especially probably related to the prolonged  use of Unna boots 02/12/18; the patient arrived in clinic today for a nurse visit/wrap change. He complained of a lot of pain in the left posterior calf. He is taking doxycycline that I previously prescribed for him. Unfortunately even though he used his stockings and apparently used to compression pumps twice a day he has weeping edema coming out of the lateral part of his right leg. This is coming from the lower anterior lateral skin area. 02/16/18; the patient has finished his doxycycline and will finish the amoxicillin 2 days. The area of cellulitis in the left calf posteriorly has resolved. He is no longer having any pain. He tells me he is using his compression pumps at least once a day sometimes twice. 02/23/18; the patient finished his doxycycline and Amoxil last week. On Friday he noticed a small erythematous circle about the size of a quarter on the left lower leg just above his ankle. This rapidly expanded and he now has erythema on the lateral and posterior part of the thigh. This is bright red. Also has an area on the dorsal foot just above his toes and a tender area just below the left popliteal fossa. He came off his prophylactic penicillin injections at his own insistence one or 2 months ago. This is obviously deteriorated since then 03/02/18; patient is on doxycycline and Amoxil. Culture I did last week of the weeping area on the back of his left calf grew group B strep. I have therefore renewed the amoxicillin 500 3 times a day for a further week. He has not been systemically unwell. Still complaining of an area of discomfort right under his left popliteal fossa. There is no open wound on the right leg. He tells me that he is using his pumps twice a day on most days 03/09/18; patient arrives in clinic today completing his amoxicillin today. The cellulitis on his left leg is better. Furthermore he tells me that he had intramuscular penicillin shots that his primary care office today. However  he also states that the wrap on his right leg fell down shortly after leaving clinic last week. He developed a large blister that was present when he came in for a nurse visit later in the week and then he  developed intense discomfort around this area.He tells me he is using his compression pumps 03/16/18; the patient has completed his doxycycline. The infectious part of this/cellulitis in the left heel area left popliteal area is a lot better. He has 2 open areas on the right calf. Still areas on the left calf but this is a lot better as well. 03/24/18; the patient arrives complaining of pain in the left popliteal area again. He thinks some of this is wrap injury. He has no open area on the right leg and really no open area on the left calf either except for the popliteal area. He claims to be compliant with the compression pumps 03/31/18; I gave him doxycycline last week because of cellulitis in the left popliteal area. This is a lot better although the surface epithelium is denuded off and response to this. He arrives today with uncontrolled edema in the right calf area as well as a fingernail injury in the right lateral calf. There is only a few open areas on the left 04/06/18; I gave him amoxicillin doxycycline over the last 2 weeks that the amoxicillin should be completing currently. He is not complaining of any pain or systemic symptoms. The only open areas see has is on the right lateral lower leg paradoxically I cannot see anything on the left lower leg. He tells me he is using his compression pumps twice a day on most days. Silver alginate to the wounds that are open under 4 layer compression 04/13/18; he completed antibiotics and has no new complaints. Using his compression pumps. Silver alginate that anything that's opened 04/20/18; he is using his compression pumps religiously. Silver alginate 4 layer compression anything that's opened. He comes in today with no open wounds on the left leg but 3  on the right including a new one posteriorly. He has 2 on the right lateral and one on the right posterior. He likes Unna boots on the right leg for reasons that aren't really clear we had the usual 4 layer compression on the left. It may be necessary to move to the 4 layer compression on the right however for now I left them in the Unna boots 04/27/18; he is using his compression pumps at least once a day. He has still the wounds on the right lateral calf. The area right posteriorly has closed. He does not have an open wound on the left under 4 layer compression however on the dorsal left foot just proximal to the toes and the left third toe 2 small open areas were identified 05/11/18; he has not uses compression pumps. The areas on the right lateral calf have coalesced into one large wound necrotic surface. On the left side he has one small wound anteriorly however the edema is now weeping out of a large part of his left leg. He says he wasn't using his pumps because of the weeping fluid. I explained to him that this is the time he needs to pump more 05/18/18; patient states he is using his compression pumps twice a day. The area on the right lateral large wound albeit superficial. On the left side he has innumerable number of small new wounds on the left calf particularly laterally but several anteriorly and medially. All these appear to have healthy granulated base these look like the remnants of blisters however they occurred under compression. The patient arrives in clinic today with his legs somewhat better. There is certainly less edema, less multiple open areas on the left  calf and the right anterior leg looks somewhat better as well superficial and a little smaller. However he relates pain and erythema over the last 3-4 days in the thigh and I looked at this today. He has not been systemically unwell no fever no chills no change in blood sugar values 05/25/18; comes in today in a better state.  The severe cellulitis on his left leg seems better with the Keflex. Not as tender. He has not been systemically unwell ooHard to find an open wound on the left lower leg using his compression pumps twice a day ooThe confluent wounds on his right lateral calf somewhat better looking. These will ultimately need debridement I didn't do this today. 06/01/18; the severe cellulitis on the left anterior thigh has resolved and he is completed his Keflex. ooThere is no open wound on the left leg however there is a superficial excoriation at the base of the third toe dorsally. Skin on the bottom of his left foot is macerated looking. ooThe left the wounds on the lateral right leg actually looks some better although he did require debridement of the top half of this wound area with an open curet 06/09/18 on evaluation today patient appears to be doing poorly in regard to his right lower extremity in particular this appears to likely be infected he has very thick purulent discharge along with a bright green tent to the discharge. This makes me concerned about the possibility of pseudomonas. He's also having increased discomfort at this point on evaluation. Fortunately there does not appear to be any evidence of infection spreading to the other location at this time. 06/16/18 on evaluation today patient appears to actually be doing fairly well. His ulcer has actually diminished in size quite significantly at this point which is good news. Nonetheless he still does have some evidence of infection he did see infectious disease this morning before coming here for his appointment. I did review the results of their evaluation and their note today. They did actually have him discontinue the Cipro and initiate treatment with linezolid at this time. He is doing this for the next seven days and they recommended a follow-up in four months with them. He is the keep a log of the need for intermittent antibiotic therapy between  now and when he falls back up with infectious disease. This will help them gaze what exactly they need to do to try and help them out. 06/23/18; the patient arrives today with no open wounds on the left leg and left third toe healed. He is been using his compression pumps twice a day. On the right lateral leg he still has a sizable wound but this is a lot better than last time I saw this. 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 Has been using silver alginate under 4 layer compression 06/30/18; the only open wound he has is on the right lateral leg and this looks healthy. No debridement is required. We have been using silver alginate. He does not have an open wound on the left leg. There is apparently some drainage from the dorsal proximal third toe on the left although I see no open wound here. 07/03/18 on evaluation today patient was actually here just for a nurse visit rapid change. However when he was here on Wednesday for his rat change due to having been healed on the left and then developing  blisters we initiated the wrap again knowing that he would be back today for [place] to reevaluate and see were at. Unfortunately he has developed some cellulitis into the proximal portion of his right lower extremity even into the region of his thigh. 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 He was placed on one as what at that point. Nonetheless he is done with that and has been tolerating it well otherwise. Doxycycline which in the past really did not seem to be effective for him. Nonetheless I think the best option may be for [place] to definitely reinitiate the antibiotics for a longer period of time. 07/07/18; since I last saw this patient a week ago he has had a difficult time. At that point he did not have an open wound on his left leg. We transitioned him into juxta  light stockings. He was apparently in the clinic the next day with blisters on the left lateral and left medial lower calf. He also had weeping edema fluid. He was put back into a compression wrap. He was also in the clinic on Friday with intense erythema in his right thigh. Per the patient he was started on Bactrim however that didn't work at all in terms of relieving his pain and swelling. He has taken 3 doxycycline that he had left over from last time and that seems to of helped. He has blistering on the right thigh as well. 07/14/18; the erythema on his right thigh has gotten better with doxycycline that he is finishing. 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 Presumably this cellulitis in the thigh was not related to that although I think this is a bit concerning going forward. He still has an area on the right lateral calf the blister on the right medial calf just below the knee that was discussed above. On the left 2 small open areas left medial and left lateral. Edema control is adequate. He is using his compression pumps twice a day 07/20/18; continued improvement in the condition of both legs especially the edema in his bilateral thighs. He tells me he is been losing weight through a combination of diet and exercise. He is using his compression pumps twice a day. So overall she made to the remaining wounds 07/27/2018; continued improvement in condition of both legs. His edema is well controlled. The area on the right lateral leg is just about closed he had one blisters show up on the medial left upper calf. We have him in 4 layer compression. He is going on a 10-day trip to [place]. He will be driving. He wants to wear Unna boots because of the lessening amount of constriction. He will not use compression pumps while he is away 08/05/18 on evaluation today patient actually appears to be doing  decently well all things considered in regard to his bilateral lower extremities. The worst ulcer is actually only posterior aspect of his left lower extremity with a four layer compression wrap cut into his leg a couple weeks back. He did have a trip and actually had [employer] for the trip that he is worn since he was last here. Nonetheless he feels like the [employer] actually do better for him his swelling is up a little bit but he also with his trip was not taking his Lasix on  a regular set schedule like he was supposed to be. He states that obviously the reason being that he cannot drive and keep going without having to urinate too frequently which makes it difficult. He did not have his pumps with him while he was away either which I think also maybe playing a role here too. 08/13/2018; the patient only has a small open wound on the right lateral calf which is a big improvement in the last month or 2. He also has the area posteriorly just below the posterior fossa on the left which I think was a wrap injury from several weeks ago. He has no current evidence of cellulitis. He tells me he is back into his compression pumps twice a day. He also tells me that while he was at the laundromat somebody stole a section of his extremitease stockings 08/20/2018; back in the clinic with a much improved state. He only has small areas on the right lateral mid calf which is just about healed. This was is more substantial area for quite a prolonged period of time. He has a small open area on the left anterior tibia. The area on the posterior calf just below the popliteal fossa is closed today. He is using his compression pumps twice a day 08/28/2018; patient has no open wound on the right leg. He has a smattering of open areas on the calf with some weeping lymphedema. More problematically than that it looks as though his wraps of slipped down in his usual he has very angry upper area of edema just  below the right medial knee and on the right lateral calf. He has no open area on his feet. The patient is traveling to [facility] next week. I will send him in an antibiotic. We will continue to wrap the right leg. We ordered extremitease stockings for him last week and I plan to transition the right leg to a stocking when he gets home which will be in 10 days time. As usual he is very reluctant to take his pumps with him when he travels 09/07/2018; patient returns from [facility]. He shows me a picture of his left leg in the mid part of his trip last week with intense fire engine erythema. The picture look bad enough I would have considered sending him to the hospital. Instead he went to the wound care center in [facility]. They did not prescribe him antibiotics but he did take some doxycycline he had leftover from a previous visit. I had given him trimethoprim sulfamethoxazole before he left this did not work according to the patient. This is resulted in some improvement fortunately. He comes back with a large wound on the left posterior calf. Smaller area on the left anterior tibia. Denuded blisters on the dorsal left foot over his toes. Does not have much in the way of wounds on the right leg although he does have a very tender area on the right posterior area just below the popliteal fossa also suggestive of infection. He promises me he is back on his pumps twice a day 09/15/2018; the intense cellulitis in his left lower calf is a lot better. The wound area on the posterior left calf is also so better. However he has reasonably extensive wounds on the dorsal aspect of his second and third toes and the proximal foot just at the base of the toes. There is nothing open on the right leg 09/22/2018; the patient has excellent edema control in his  legs bilaterally. He is using his external compression pumps twice a day. He has no open area on the right leg and only the areas in the left  foot dorsally second and third toe area on the left side. He does not have any signs of active cellulitis. 10/06/2018; the patient has good edema control bilaterally. He has no open wound on the right leg. There is a blister in the posterior aspect of his left calf that we had to deal with today. He is using his compression pumps twice a day. There is no signs of active cellulitis. We have been using silver alginate to the wound areas. He still has vulnerable areas on the base of his left first second toes dorsally He has a his extremities stockings and we are going to transition him today into the stocking on the right leg. He is cautioned that he will need to continue to use the compression pumps twice a day. If he notices uncontrolled edema in the right leg he may need to go to 3 times a day. 10/13/2018; the patient came in for a nurse check on Friday he has a large flaccid blister on the right medial calf just below the knee. We unroofed this. He has this and a new area underneath the posterior mid calf which was undoubtedly a blister as well. He also has several small areas on the right which is the area we put his extremities stocking on. 10/19/2018; the patient went to see infectious disease this morning I am not sure if that was a routine follow-up in any case the doxycycline I had given him was discontinued and started on linezolid. He has not started this. It is easy to look at his left calf and the inflammation and think this is cellulitis however he is very tender in the tissue just below the popliteal fossa and I have no doubt that there is infection going on here. He states the problem he is having is that with the compression pumps the edema goes down and then starts walking the wrap falls down. We will see if we can adhere this. He has 1 or 2 minuscule open areas on the right still areas that are weeping on the posterior left calf, the base of his left second and third toes 10/26/18;  back today in clinic with quite of skin breakdown in his left anterior leg. This may have been infection the area below the popliteal fossa seems a lot better however tremendous epithelial loss on the left anterior mid tibia area over quite inexpensive tissue. He has 2 blisters on the right side but no other open wound here. 10/29/2018; came in urgently to see [place] today and we worked him in for review. He states that the 4 layer compression on the right leg caused pain he had to cut it down to roughly his mid calf this caused swelling above the wrap and he has blisters and skin breakdown today. As a result of the pain he has not been using his pumps. Both legs are a lot more edematous and there is a lot of weeping fluid. 11/02/18; arrives in clinic with continued difficulties in the right leg> left. Leg is swollen and painful. multiple skin blisters and new open areas especially laterally. He has not been using his pumps on the right leg. 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 He is not systemically unwell. 11/09/2018; the patient claims he is being compliant  with his pumps. He is finished the doxycycline I gave him last week. Culture I did of the wound on the right lateral leg showed a few very resistant methicillin staph aureus. This was resistant to doxycycline. Nevertheless he states the pain in the leg is a lot better which makes me wonder if the cultured organism was not really what was causing the problem nevertheless this is a very dangerous organism to be culturing out of any wound. His right leg is still a lot larger than the left. He is using an Radio broadcast assistant on this area, he blames a 4-layer compression for causing the original skin breakdown which I doubt is true however I cannot talk him out of it. We have been using silver alginate to all of these areas which were initially blisters 11/16/2018; patient is being compliant with his external  compression pumps at twice a day. Miraculously he arrives in clinic today with absolutely no open wounds. He has better edema control on the left where he has been using 4 layer compression versus wound of wounds on the right and I pointed this out to him. There is no inflammation in the skin in his lower legs which is also somewhat unusual for him. There is no open wounds on the dorsal left foot. He has extremitease stockings at home and I have asked him to bring these in next week. 11/25/18 patient's lower extremity on examination today on the left appears for the most part to be wound free. He does have an open wound on the lateral aspect of the right lower extremity but this is minimal compared to what I've seen in past. He does request that we go ahead and wrap the left leg as well even though there's nothing open just so hopefully it will not reopen in short order. 1/28; patient has superficial open wounds on the right lateral calf left anterior calf and left posterior calf. His edema control is adequate. 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 We have been using silver alginate as the primary dressing. He claims compliance with his compression pumps 2/4; patient has superficial open wounds on numerous areas of his left calf and again one on the left dorsal foot. The areas on the right lateral calf have healed. The cellulitis that I gave him doxycycline for last week is also resolved this was mostly on the left anterior calf just below the tibial tuberosity. His edema looks fairly well-controlled. He tells me he went to see his primary doctor today and had blood work ordered 2/11; once again he has several open areas on the left calf left tibial area. Most of these are small and appear to have healthy granulation. He does not have anything open on the right. The edema and control in his thighs is pretty good  which is usually a good indication he has been using his pumps as requested. 2/18; he continues to have several small areas on the left calf and left tibial area. Most of these are small healthy granulation. We put him in his stocking on the right leg last week and he arrives with a superficial open area over the right upper tibia and a fairly large area on the right lateral tibia in similar condition. His edema control actually does not look too bad, he claims to be using his compression pumps twice a day 2/25. Continued small areas on the left  calf and left tibial area. New areas especially on the right are identified just below the tibial tuberosity and on the right upper tibia itself. There are also areas of weeping edema fluid even without an obvious wound. He does not have a considerable degree of lymphedema but clearly there is more edema here than his skin can handle. He states he is using the pumps twice a day. We have an Unna boot on the right and 4 layer compression on the left. 3/3; he continues to have an area on the right lateral calf and right posterior calf just below the popliteal fossa. There is a fair amount of tenderness around the wound on the popliteal fossa but I did not see any evidence of cellulitis, could just be that the wrap came down and rubbed in this area. ooHe does not have an open area on the left leg however there is an area on the left dorsal foot at the base of the third toe ooWe have been using silver alginate to all wound areas 3/10; he did not have an open area on his left leg last time he was here a week ago. T oday he arrives with a horizontal wound just below the tibial tuberosity and an area on the left lateral calf. He has intense erythema and tenderness in this area. The area is on the right lateral calf and right posterior calf better than last week. We have been using silver alginate as usual 3/18 - Patient returns with 3 small open areas on left calf,  and 1 small open area on right calf, the skin looks ok with no significant erythema, he continues the UNA boot on right and 4 layer compression on left. The right lateral calf wound is closed , the right posterior is small area. we will continue silver alginate to the areas. 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 He is been tolerating the dressing changes without complication. Fortunately she has made excellent improvement in regard to the overall status of his wounds. Unfortunately every time we cease wrapping him he ends up reopening in causing more significant issues at that point. Again I'm unsure of the best direction to take although I think the lymphedema clinic may be appropriate for him. 02/03/19 on evaluation today patient appears to be doing well in regard to the wounds that we saw him for last week unfortunately he has a new area on the proximal portion of his right medial/posterior lower extremity where the wrap somewhat slowed down and caused swelling and a blister to rub and open. Unfortunately this is the only opening that he has on either leg at this point. 02/17/19 on evaluation today patient's bilateral lower extremities appear to be doing well. He still completely healed in regard to the left lower extremity. In regard to the right lower extremity the area where the wrap and slid down and caused the blister still seems to be slightly open although this is dramatically better than during the last evaluation two weeks ago. I'm very pleased with the way this stands overall. 03/03/19 on evaluation today patient appears to be doing well in regard to his right lower extremity in general although he did have a new blister open this does not appear to be showing any evidence of active infection  at  this time. Fortunately there's No fevers, chills, nausea, or vomiting noted at this time. Overall I feel like he is making good progress it does feel like that the right leg will we perform the [employer] seems to do with a bit better than three layer wrap on the left which slid down on him. We may switch to doing bilateral in the book wraps. 5/4; I have not seen Mr. Aydelotte in quite some time. According to our case manager he did not have an open wound on his left leg last week. He had 1 remaining wound on the right posterior medial calf. He arrives today with multiple openings on the left leg probably were blisters and/or wrap injuries from Unna boots. I do not think the Unna boot's will provide adequate compression on the left. I am also not clear about the frequency he is using the compression pumps. 03/17/19 on evaluation today patient appears to be doing excellent in regard to his lower extremities compared to last week's evaluation apparently. He had gotten significantly worse last week which is unfortunate. The [employer] wrap on the left did not seem to do very well for him at all and in fact it didn't control his swelling significantly enough he had an additional outbreak. Subsequently we go back to the four layer compression wrap on the left. This is good news. At least in that he is doing better and the wound seem to be killing him. He still has not heard anything from the lymphedema clinic. 03/24/19 on evaluation today patient actually appears to be doing much better in regard to his bilateral lower Trinity as compared to last week when I saw him. Fortunately there's no signs of active infection at this time. He has been tolerating the dressing changes without complication. Overall I'm extremely pleased with the progress and appearance in general. 04/07/19 on evaluation today patient appears to be doing well in regard to his bilateral lower extremities. His swelling is significantly down from  where it was previous. With that being said he does have a couple blisters still open at this point but fortunately nothing that seems to be too severe and again the majority of the larger openings has healed at this time. 04/14/19 on evaluation today patient actually appears to be doing quite well in regard to his bilateral lower extremities in fact I'm not even sure there's anything significantly open at this time at any site. Nonetheless he did have some trouble with these wraps where they are somewhat irritating him secondary to the fact that he has noted that the graph wasn't too close down to the end of this foot in a little bit short as well up to his knee. Otherwise things seem to be doing quite well. 04/21/19 upon evaluation today patient's wound bed actually showed evidence of being completely healed in regard to both lower extremities which is excellent news. There does not appear to be any signs of active infection which is also good news. I'm very pleased in this regard. No fevers, chills, nausea, or vomiting noted at this time. 04/28/19 on evaluation today patient appears to be doing a little bit worse in regard to both lower extremities on the left mainly due to the fact that when he went infection disease the wrap was not wrapped quite high enough he developed a blister above this. On the right he is a small open area of nothing too significant but again this is continuing to give him some trouble  he has been were in the Velcro compression that he has at home. 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 He's been tolerating the dressing changes without complication. Fortunately there's no signs of active infection at this time. No fevers, chills, nausea, or vomiting noted at this time. We have been trying to get an appointment with her lymphedema clinic in [facility] but unfortunately nobody can get them on  phone with not been able to even fax information over the patient likewise is not been able to get in touch with them. Overall I'm not sure exactly what's going on here with to reach out again today. 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 Still having a lot of drainage unfortunately. He tells me especially in the left but even on the right. There's no signs of active infection which is good news we've been using so ratcheted up to this point. 05/19/19 on evaluation today patient actually appears to be doing quite well with regard to his left lower extremity which is great news. Fortunately in regard to the right lower extremity has an issues with his wrap and he subsequently did remove this from what I'm understanding. Nonetheless long story short is what he had rewrapped once he removed it subsequently had maggots underneath this wrap whenever he came in for evaluation today. With that being said they were obviously completely cleaned away by the nursing staff. The visit today which is excellent news. However he does appear to potentially have some infection around the right ankle region where the maggots were located as well. He will likely require anabiotic therapy today. 05/26/19 on evaluation today patient actually appears to be doing much better in regard to his bilateral lower extremities. I feel like the infection is under much better control. With that being said there were maggots noted when the wrap was removed yet again today. Again this could have potentially been left over from previous although at this time there does not appear to be any signs of significant drainage there was obviously on the wrap some drainage as well this contracted gnats or otherwise. Either way I do not see anything that appears to be doing worse in my pinion and in fact I think his drainage has slowed down quite significantly  likely mainly due to the fact to his infection being under better control. 06/02/2019 on evaluation today patient actually appears to be doing well with regard to his bilateral lower extremities there is no signs of active infection at this time which is great news. With that being said he does have several open areas more so on the right than the left but nonetheless these are all significantly better than previously noted. 06/09/2019 on evaluation today patient actually appears to be doing well. His wrap stayed up and he did not cause any problems he had more drainage on the right compared to the left but overall I do not see any major issues at this time which is great news. 06/16/2019 on evaluation today patient appears to be doing excellent with regard to his lower extremities the only area that is open is a new blister that can have opened as of today on the medial ankle on the left. Other than this he really seems to be doing great I see no major issues at this point. 06/23/2019 on evaluation today patient  appears to be doing quite well with regard to his bilateral lower extremities. In fact he actually appears to be almost completely healed there is a small area of weeping noted of the right lower extremity just above the ankle. Nonetheless fortunately there is no signs of active infection at this time which is good news. No fevers, chills, nausea, vomiting, or diarrhea. 8/24; the patient arrived for a nurse visit today but complained of very significant pain in the left leg and therefore I was asked to look at this. Noted that he did not have an open area on the left leg last week nevertheless this was wrapped. The patient states that he is not been able to put his compression pumps on the left leg because of the discomfort. He has not been systemically unwell 06/30/2019 on evaluation today patient unfortunately despite being excellent last week is doing much worse with regard to his left lower  extremity today. In fact he had to come in for a nurse on Monday where his left leg had to be rewrapped due to excessive weeping Dr. Leanord Hawking placed him on doxycycline at that point. Fortunately there is no signs of active infection Systemically at this time which is good news. 07/07/2019 in regard to the patient's wounds today he actually seems to be doing well with his right lower extremity there really is nothing open or draining at this point this is great news. Unfortunately the left lower extremity is given him additional trouble at this time. There does not appear to be any signs of active infection nonetheless he does have a lot of edema and swelling noted at this point as well as blistering all of which has led to a much more poor appearing leg at this time compared to where it was 2 weeks ago when it was almost completely healed. Obviously this is a little discouraging for the patient. He is try to contact the lymphedema clinic in [place] he has not been able to get through to them. 07/14/2019 on evaluation today patient actually appears to be doing slightly better with regard to his left lower extremity ulcers. Overall I do feel like at least at the top of the wrap that we have been placing this area has healed quite nicely and looks much better. The remainder of the leg is showing signs of improvement. Unfortunately in the thigh area he still has an open region on the left and again on the right he has been utilizing just a Band-Aid on an area that also opened on the thigh. Again this is an area that were not able to wrap although we did do an Ace wrap to provide some compression that something that obviously is a little less effective than the compression wraps we have been using on the lower portion of the leg. He does have an appointment with the lymphedema clinic in [facility] on Friday. 07/21/2019 on evaluation today patient appears to be doing better with regard to his lower  extremity ulcers. He has been tolerating the dressing changes without complication. Fortunately there is no signs of active infection at this time. No fevers, chills, nausea, vomiting, or diarrhea. I did receive the paperwork from the physical therapist at the lymphedema clinic in [place]. Subsequently I signed off on that this morning and sent that back to him for further progression with the treatment plan. 07/28/2019 on evaluation today patient appears to be doing very well with regard to his right lower extremity where I do not  see any open wounds at this point. Fortunately he is feeling great as far as that is concerned as well. In regard to the left lower extremity he has been having issues with still several areas of weeping and edema although the upper leg is doing better his lower leg still I think is going require the compression wrap at this time. No fevers, chills, nausea, vomiting, or diarrhea. 08/04/2019 on evaluation today patient unfortunately is having new wounds on the right lower extremity. Again we have been using Unna boot wrap on that side. We switched him to using his juxta lite wrap at home. With that being said he tells me he has been using it although his legs extremely swollen and to be honest really does not appear that he has been. I cannot know that for sure however. Nonetheless he has multiple new wounds on the right lower extremity at this time. Obviously we will have to see about getting this rewrapped for him today. 08/11/2019 on evaluation today patient appears to be doing fairly well with regard to his wounds. He has been tolerating the dressing changes including the compression wraps without complication. He still has a lot of edema in his upper thigh regions bilaterally he is supposed to be seeing the lymphedema clinic on the 15th of this month once his wraps arrive for the upper part of his legs. 08/18/2019 on evaluation today patient appears to be doing well  with regard to his bilateral lower extremities at this point. He has been tolerating the dressing changes without complication. Fortunately there is no signs of active infection which is also good news. He does have a couple weeping areas on the first and second toe of the right foot he also has just a small area on the left foot upper leg and a small area on the left lower leg but overall he is doing quite well in my opinion. He is supposed to be getting his wraps shortly in fact tomorrow and then subsequently is seeing the lymphedema clinic next Wednesday on the 21st. Of note he is also leaving on the 25th to go on vacation for a week to the beach. For that reason and since there is some uncertainty about what there can be doing at lymphedema clinic next Wednesday I am get a make an appointment for next Friday here for [place] to see what we need to do for him prior to him leaving for vacation. 10/23; patient arrives in considerable pain predominantly in the upper posterior calf just distal to the popliteal fossa also in the wound anteriorly above the major wound. This is probably cellulitis and he has had this recurrently in the past. He has no open wound on the right side and he has had an Radio broadcast assistant in that area. Finally I note that he has an area on the left posterior calf which by enlarge is mostly epithelialized. This protrudes beyond the borders of the surrounding skin in the setting of dry scaly skin and lymphedema. The patient is leaving for [facility] on Sunday. Per his longstanding pattern, he will not take his compression pumps with him predominantly out of fear that they will be stolen. He therefore asked that we put a Unna boot back on the right leg. He will also contact the wound care center in [facility] to see if they can change his dressing in the mid week. 11/3; patient returned from his vacation to [facility]. He was seen on 1 occasion  at their wound care center. They did a 2  layer compression system as they did not have our 4-layer wrap. I am not completely certain what they put on the wounds. They did not change the Unna boot on the right. The patient is also seeing a lymphedema specialist physical therapist in [place]. It appears that he has some compression sleeve for his thighs which indeed look quite a bit better than I am used to seeing. He pumps over these with his external compression pumps. 11/10; the patient has a new wound on the right medial thigh otherwise there is no open areas on the right. He has an area on the left leg posteriorly anteriorly and medially and an area over the left second toe. We have been using silver alginate. He thinks the injury on his thigh is secondary to friction from the compression sleeve he has. 11/17; the patient has a new wound on the right medial thigh last week. He thinks this is because he did not have a underlying stocking for his thigh juxta lite apparatus. He now has this. The area is fairly large and somewhat angry but I do not think he has underlying cellulitis. ooHe has a intact blister on the right anterior tibial area. ooSmall wound on the right great toe dorsally ooSmall area on the medial left calf. 11/30; the patient does not have any open areas on his right leg and we did not take his juxta lite stocking off. However he states that on Friday his compression wrap fell down lodging around his upper mid calf area. As usual this creates a lot of problems for him. He called urgently today to be seen for a nurse visit however the nurse visit turned into a provider visit because of extreme erythema and pain in the left anterior tibia extending laterally and posteriorly. The area that is problematic is extensive 10/06/2019 upon evaluation today patient actually appears to be doing poorly in regard to his left lower extremity. He Dr. Leanord Hawking did place him on doxycycline this past Monday apparently due to the fact  that he was doing much worse in regard to this left leg. Fortunately the doxycycline does seem to be helping. Unfortunately we are still having a very difficult time getting his edema under any type of control in order to anticipate discharge at some point. The only way were really able to control his lymphedema really is with compression wraps and that has only even seemingly temporary. He has been seeing a lymphedema clinic they are trying to help in this regard but still this has been somewhat frustrating in general for the patient. 10/13/19 on evaluation today patient appears to be doing excellent with regard to his right lower extremity as far as the wounds are concerned. His swelling is still quite extensive unfortunately. He is still having a lot of drainage from the thigh areas bilaterally which is unfortunate. He's been going to lymphedema clinic but again he still really does not have this edema under control as far as his lower extremities are concern. With regard to his left lower extremity this seems to be improving and I do believe the doxycycline has been of benefit for him. He is about to complete the doxycycline. 10/20/2019 on evaluation today patient appears to be doing poorly in regard to his bilateral lower extremities. More in the right thigh he has a lot of irritation at this site unfortunately. In regard to the left lower extremity the wrap was not quite  as high it appears and does seem to have caused him some trouble as well. Fortunately there is no evidence of systemic infection though he does have some blue-green drainage which has me concerned for the possibility of Pseudomonas. He tells me he is previously taking Cipro without complications and he really does not care for Levaquin however due to some of the side effects he has. He is not allergic to any medications specifically antibiotics that were aware of. 10/27/2019 on evaluation today patient actually does appear to be  for the most part doing better when compared to last week's evaluation. With that being said he still has multiple open wounds over the bilateral lower extremities. He actually forgot to start taking the Cipro and states that he still has the whole bottle. He does have several new blisters on left lower extremity today I think I would recommend he go ahead and take the Cipro based on what I am seeing at this point. 12/30-Patient comes at 1 week visit, 4 layer compression wraps on the left and Unna boot on the right, primary dressing Xtrasorb and silver alginate. Patient is taking his Cipro and has a few more days left probably 5-6, and the legs are doing better. He states he is using his compressions devices which I believe he has 11/10/2019 on evaluation today patient actually appears to be much better than last time I saw him 2 weeks ago. His wounds are significantly improved and overall I am very pleased in this regard. Fortunately there is no signs of active infection at this time. He is just a couple of days away from completing Cipro. Overall his edema is much better he has been using his lymphedema pumps which I think is also helping at this point. 11/17/2019 on evaluation today patient appears to be doing excellent in regard to his wounds in general. His legs are swollen but not nearly as much as they have been in the past. Fortunately he is tolerating the compression wraps without complication. No fevers, chills, nausea, vomiting, or diarrhea. He does have some erythema however in the distal portion of his right lower extremity specifically around the forefoot and toes there is a little bit of warmth here as well. 11/24/2019 on evaluation today patient appears to be doing well with regard to his right lower extremity I really do not see any open wounds at this point. His left lower extremity does have several open areas and his right medial thigh also is open. Other than this however overall the  patient seems to be making good progress and I am very pleased at this point. 12/01/2019 on evaluation today patient appears to be doing poorly at this point in regard to his left lower extremity has several new blisters despite the fact that we have him in compression wraps. In fact he had a 4-layer compression wrap, his upper thigh wrapped from lymphedema clinic, and a juxta light over top of the 4 layer compression wrap the lymphedema clinic applied and despite all this he still develop blisters underneath. Obviously this does have me concerned about the fact that unfortunately despite what we are doing to try to get wounds healed he continues to have new areas arise I do not think he is ever good to be at the point where he can realistically just use wraps at home to keep things under control. Typically when we heal him it takes about 1-2 days before he is back in the clinic with severe breakdown  and blistering of his lower extremities bilaterally. This is happened numerous times in the past. Unfortunately I think that we may need some help as far as overall fluid overload to kind of limit what we are seeing and get things under better control. 12/08/2019 on evaluation today patient presents for follow-up concerning his ongoing bilateral lower extremity edema. Unfortunately he is still having quite a bit of swelling the compression wraps are controlling this to some degree but he did see Dr. [name] his cardiologist I do have that available for review today as far as the appointment was concerned that was on 12/06/2019. Obviously that she has been 2 days ago. The patient states that he is only been taking the Lasix 80 mg 1 time a day he had told me previously he was taking this twice a day. Nonetheless Dr. [name] recommended this be up to 80 mg 2 times a day for the patient as he did appear to be fluid overloaded. With that being said the patient states he did this yesterday and he was unable to go anywhere  or do anything due to the fact that he was constantly having to urinate. Nonetheless I think that this is still good to be something that is important for him as far as trying to get his edema under control at all things that he is going to be able to just expect his wounds to get under control and things to be better without going through at least a period of time where he is trying to stabilize his fluid management in general and I think increasing the Lasix is likely the first step here. It was also mentioned the possibility that the patient may require metolazone. With that being said he wanted to have the patient take Lasix twice a day first and then reevaluating 2 months to see where things stand. 12/15/2019 upon evaluation today patient appears to be doing regard to his legs although his toes are showing some signs of weeping especially on the left at this point to some degree on the right. There does not appear to be any signs of active infection and overall I do feel like the compression wraps are doing well for him but he has not been able to take the Lasix at home and the increased dose that Dr. [name] recommended. He tells me that just not go to be feasible for him. Nonetheless I think in this case he should probably send a message to Dr. [name] in order to discuss options from the standpoint of possible admission to get the fluid off or otherwise going forward. 12/22/2019 upon evaluation today patient appears to be doing fairly well with regard to his lower extremities at this point. In fact he would be doing excellent if it was not for the fact that his right anterior thigh apparently had an allergic reaction to adhesive tape that he used. The wound itself that we have been monitoring actually appears to be healed. There is a lot of irritation at this point. 12/29/2019 upon evaluation today patient appears to be doing well in regard to his lower extremities. His left medial thigh is open and  somewhat draining today but this is the only region that is open the right has done much better with the treatment utilizing the steroid cream that I prescribed for him last week. Overall I am pleased in that regard. Fortunately there is no signs of active infection at this time. No fevers, chills, nausea, vomiting, or diarrhea.  01/05/2020 upon evaluation today patient appears to be doing more poorly in regard to his right lower extremity at this point upon evaluation today. Unfortunately he continues to have issues in this regard and I think the biggest issue is controlling his edema. This obviously is not very well controlled at this point is been recommended that he use the Lasix twice a day but he has not been able to do that. Unfortunately I think this is leading to an issue where honestly he is not really able to effectively control his edema and therefore the wounds really are not doing significantly better. I do not think that he is going to be able to keep things under good control unless he is able to control his edema much better. I discussed this again in great detail with him today. 01/12/2020 good news is patient actually appears to be doing quite well today at this point. He does have an appointment with lymphedema clinic tomorrow. His legs appear healed and the toe on the left is almost completely healed. In general I am very pleased with how things stand at this point. 01/19/2020 upon evaluation today patient appears to actually be doing well in regard to his lower extremities there is nothing open at this point. Fortunately he has done extremely well more recently. Has been seeing lymphedema clinic as well. With that being said he has Velcro wraps for his lower legs as well as his upper legs. The only wound really is on his toe which is the right great toe and this is barely anything even there. With all that being said I think it is good to be appropriate today to go ahead and switch him  over to the Velcro compression wraps. 01/26/2020 upon evaluation today patient appears to be doing worse with regard to his lower extremities after last week switch him to Velcro compression wraps. Unfortunately he lasted less than 24 hours he did not have the sock portion of his Velcro wrap on the left leg and subsequently developed a blister underneath the Velcro portion. Obviously this is not good and not what we were looking for at this point. He states the lymphedema clinic did tell him to wear the wrap for 23 hours and take him off for 1 I am okay with that plan but again right now we got a get things back under control again he may have some cellulitis noted as well. 02/02/2020 upon evaluation today patient unfortunately appears to have several areas of blistering on his bilateral lower extremities today mainly on the feet. His legs do seem to be doing somewhat better which is good news. Fortunately there is no evidence of active infection at this time. No fevers, chills, nausea, vomiting, or diarrhea. 02/16/2020 upon evaluation today patient appears to be doing well at this time with regard to his legs. He has a couple weeping areas on his toes but for the most part everything is doing better and does appear to be sealed up on his legs which is excellent news. We can continue with wrapping him at this point as he had every time we discontinue the wraps he just breaks out with new wounds. There is really no point in is going forward with this at this point. 03/08/2020 upon evaluation today patient actually appears to be doing quite well with regard to his lower extremity ulcers. He has just a very superficial and really almost nonexistent blister on the left lower extremity he has in general done  very well with the compression wraps. With that being said I do not see any signs of infection at this time which is good news. 03/29/2020 upon evaluation today patient appears to be doing well with regard  to his wounds currently except for where he had several new areas that opened up due to some of the wrap slipping and causing him trouble. He states he did not realize they had slipped. Nonetheless he has a 1 area on the right and 3 new areas on the left. Fortunately there is no signs of active infection at this time which is great news. 04/05/2020 upon evaluation today patient actually appears to be doing quite well in general in regard to his legs currently. Fortunately there is no signs of active infection at this time. No fevers, chills, nausea, vomiting, or diarrhea. He tells me next week that he will actually be seen in the lymphedema clinic on Thursday at 10 AM I see him on Wednesday next week. 04/12/2020 upon evaluation today patient appears to be doing very well with regard to his lower extremities bilaterally. In fact he does not appear to have any open wounds at this point which is good news. Fortunately there is no signs of active infection at this time. No fevers, chills, nausea, vomiting, or diarrhea. 04/19/2020 upon evaluation today patient appears to be doing well with regard to his wounds currently on the bilateral lower extremities. There does not appear to be any signs of active infection at this time. Fortunately there is no evidence of systemic infection and overall very pleased at this point. Nonetheless after I held him out last week he literally had blisters the next morning already which swelled up with him being right back here in the clinic. Overall I think that he is just not can be able to be discharged with his legs the way they are he is much to volume overloaded as far as fluid is concerned and that was discussed with him today of also discussed this but should try the clinic nurse manager as well as Dr. Leanord Hawking. 04/26/2020 upon evaluation today patient appears to be doing better with regard to his wounds currently. He is making some progress and overall swelling is under good  control with the compression wraps. Fortunately there is no evidence of active infection at this time. 05/10/2020 on evaluation today patient appears to be doing overall well in regard to his lower extremities bilaterally. He is Tolerating the compression wraps without complication and with what we are seeing currently I feel like that he is making excellent progress. There is no signs of active infection at this time. 05/24/2020 upon evaluation today patient appears to be doing well in regard to his legs. The swelling is actually quite a bit down compared to where it has been in the past. Fortunately there is no sign of active infection at this time which is also good news. With that being said he does have several wounds on his toes that have opened up at this point. 05/31/2020 upon evaluation today patient appears to be doing well with regard to his legs bilaterally where he really has no significant fluid buildup at this point overall he seems to be doing quite well. Very pleased in this regard. With regard to his toes these also seem to be drying up which is excellent. We have continue to wrap him as every time we tried as a transition to the juxta light wraps things just do not seem to  get any better. 06/07/2020 upon evaluation today patient appears to be doing well with regard to his right leg at this point. Unfortunately left leg has a lot of blistering he tells me the wrap started to slide down on him when he tried to put his other Velcro wrap over top of it to help keep things in order but nonetheless still had some issues. 06/14/2020 on evaluation today patient appears to be doing well with regard to his lower extremity ulcers and foot ulcers at this point. I feel like everything is actually showing signs of improvement which is great news overall there is no signs of active infection at this time. No fevers, chills, nausea, vomiting, or diarrhea. 06/21/2020 on evaluation today patient actually  appears to be doing okay in regard to his wounds in general. With that being said the biggest issue I see is on his right foot in particular the first and second toe seem to be doing a little worse due to the fact this is staying very wet. I think he is probably getting need to change out his dressings a couple times in between each week when we see him in regard to his toes in order to keep this drier based on the location and how this is proceeding. 06/28/2020 on evaluation today patient appears to be doing a little bit more poorly overall in regard to the appearance of the skin I am actually somewhat concerned about the possibility of him having a little bit of an infection here. We discussed the course of potentially giving him a doxycycline prescription which he is taken previously with good result. With that being said I do believe that this is potentially mild and at this point easily fixed. I just do not want anything to get any worse. 07/12/2020 upon evaluation today patient actually appears to be making some progress with regard to his legs which is great news there does not appear to be any evidence of active infection. Overall very pleased with where things stand. 07/26/2020 upon evaluation today patient appears to be doing well with regard to his leg ulcers and toe ulcers at this point. He has been tolerating the compression wraps without complication overall very pleased in this regard. 08/09/2020 upon evaluation today patient appears to be doing well with regard to his lower extremities bilaterally. Fortunately there is no signs of active infection overall I am pleased with where things stand. 08/23/2020 on evaluation today patient appears to be doing well with regard to his wound. He has been tolerating the dressing changes without complication. Fortunately there is no signs of active infection at this time. Overall his legs seem to be doing quite well which is great news and I am very  pleased in that regard. No fevers, chills, nausea, vomiting, or diarrhea. 09/13/2020 upon evaluation today patient appears to be doing okay in regard to his lower extremities. He does have a fairly large blister on the right leg which I did remove the blister tissue from today so we can get this to dry out other than that however he seems to be doing quite well. There is no signs of active infection at this time. 09/27/2020 upon evaluation today patient appears to actually be doing some better in regard to his right leg. Fortunately signs of active infection at this time which is great news. No fevers, chills, nausea, vomiting, or diarrhea. 10/04/2020 upon evaluation today patient actually appears to be showing signs of improvement which is great news  with regard to his leg ulcers. Fortunately there is no signs of active infection which is great news he is still taking the antibiotics currently. No fevers, chills, nausea, vomiting, or diarrhea. 10/18/2020 on evaluation today patient appears to be doing well with regard to his legs currently. He has been tolerating the dressing changes including the wraps without complication. Fortunately there is no signs of active infection at this time. No fevers, chills, nausea, vomiting, or diarrhea. 10/25/2020 upon evaluation today patient appears to be doing decently well in regard to his wounds currently. He has been tolerating the dressing changes without complication. Overall I feel like he is making good progress albeit slow. Again this is something we can have to continue to wrap for some time to come most likely. 11/08/2020 upon evaluation today patient appears to be doing well with regard to his wounds currently. He has been tolerating the dressing changes without complication is not currently on any antibiotics and he does not appear to show any signs of infection. He does continue to have a lot of drainage on the right leg not too severe but nonetheless  this is very scattered. On the left leg this is looking to be much improved overall. 11/15/2020 upon evaluation today patient appears to be doing better with regard to his legs bilaterally. Especially the right leg which was much more significant last week. There does not appear to be any signs of active infection which is great news. No fevers, chills, nausea, vomiting, or diarrhea. 11/23/2019 upon evaluation today patient appears to be doing poorly still in regard to his lower extremities bilaterally. Unfortunately his right leg in particular appears to be doing much more poorly there is no signs really of infection this is not warm to touch but he does have a lot of drainage and weeping unfortunately. With that reason I do believe that we may need to initiate some treatment here to try to help calm down some of the swelling of the right leg. I think switching to a 4-layer compression wrap would be beneficial here. The patient is in agreement with giving this a try. 11/29/2020 upon evaluation today patient appears to be doing well currently in regard to his leg ulcers. I feel like the right leg is doing better he still has a lot of drainage but we do see some improvement here. The 4-layer compression wrap I think was helpful. 12/06/2020 upon evaluation today patient appears to be doing well with regard to his legs. In fact they seem to be doing about the best I have seen up to this point. Fortunately there is no signs of active infection at this time. No fevers, chills, nausea, vomiting, or diarrhea. 12/20/2020 upon evaluation today patient appears to be doing well at this time in regard to his legs. He is not having any significant draining which is great news. Fortunately there is no signs of active infection at this time. No fevers, chills, nausea, vomiting, or diarrhea. 01/17/2021 upon evaluation today [place] actually appears to be doing excellent in regard to his legs. He has a few areas again that come  and go as far as his toes are concerned but overall this is doing quite well. 01/31/2021 upon evaluation today patient appears to be doing well with regard to his legs. Fortunately there does not appear to be any signs of active infection which is great news. Overall he is still having significant edema despite the compression wraps basically the 4-layer compression wrap to just  keep things under control there is really not much room for play. 4/13: Mr. Nuttall is a longstanding patient in our clinic and benefits greatly from weekly compression wraps. Today he has no complaints. He has been tolerating the wraps well. He states he is using the lymphedema pumps at home. 5/4; patient presents for follow-up of his chronic lymphedema/venous insufficiency ulcers. He comes weekly for compression wraps. He has no complaints today. He was unable to tolerate the Coflex 2 layer Last week so we will do the four press 4-layer compression. He has been using his lymphedema pumps daily. 5/18; patient presents for 2-week follow-up. He has no complaints or issues today. He has developed a new wound to the right foot on his fourth toe. He overall feels well and denies signs of infection. 6/1; patient presents for 2-week follow-up. He has no complaints or issues today. He denies signs of infection. 04/18/2021 upon evaluation today patient appears to be doing well with regard to his legs bilaterally. Family open wound is actually on the toe of his left foot everything else is completely closed which is great news. In general I am extremely pleased with where things stand at this point. The patient is also happy that things are doing so well. 05/02/2021 upon evaluation today patient's legs actually appear to be doing quite well today. Fortunately there does not appear to be any signs of active infection which is great and overall I am extremely pleased with where he stands today. The patient does not appear to have any  evidence of active infection at this time which is also great news. 05/09/2021 upon evaluation today patient appears to be doing a little bit more poorly in regard to his legs. Unfortunately he is having issues with some breakdown and a blood blister on the left leg this is due to I believe honestly to how it was wrapped last week. Fortunately there does not appear to be any signs of infection but nonetheless this is still a concern to be honest. No fevers, chills, nausea, vomiting, or diarrhea. 05/16/2021 upon evaluation today patient appears to be doing significantly better as compared to last week. I am very pleased with where things stand today. There does not appear to be any signs of infection which is great news and overall very pleased with where we stand. No fevers, chills, nausea, vomiting, or diarrhea. 05/30/2021 upon evaluation today patient appears to be doing well with regard to his legs. He has been tolerating the dressing changes without complication. Fortunately there does not appear to be any signs of active infection which is great news and overall I am extremely pleased with where things stand today. No fevers, chills, nausea, vomiting, or diarrhea. 06/20/2021 upon evaluation today patient actually appears to be making good progress today and very pleased with what we are seeing. I think his legs are really maintaining. As long as we continue wrapping he seems to be doing excellent in my opinion. Fortunately there is no signs of active infection at this time. No fevers, chills, nausea, vomiting, or diarrhea. 07/11/2021 upon evaluation today patient actually appears to be making excellent progress at this time. Fortunately there does not appear to be any evidence of active infection which is great news and overall I am extremely pleased with where things stand today. No fevers, chills, nausea, vomiting, or diarrhea. 07/25/2021 upon evaluation today patient appears to be doing well  currently in regard to his lower extremities. He has been making good progress  here and I do not see anything that is actually open significantly today this is great news. No fevers, chills, nausea, vomiting, or diarrhea. 08/08/2021 upon evaluation today patient appears to be doing well with regard to his wound. He has been tolerating the dressing changes without complication. With that being said unfortunately has a new area that opened up as far as his right posterior leg is concerned this was a blister he also has an area on the third toe right foot which also reopen. Fortunately there is no signs of active infection at this time which is great news. No fevers, chills, nausea, vomiting, or diarrhea. 10/17; patient came in today at his request initially for a nurse visit because it but out of concern for deterioration in both his lower legs and cellulitis I was asked to look at him. He comes in with increased swelling which he says started over the weekend he started to notice pain as well in his left medial ankle, right knee, left knee left dorsal foot. His wraps fell down contributing to some of this but he has not been using his compression pumps over the weekend for reasons that are not really clear. He comes in with multiple new wounds including the right posterior leg, right third toe, right fourth toe, left second toe left medial ankle left dorsal ankle and right anterior lower leg 09/19/2021 upon evaluation today patient does seem to be making improvements in general which is great news. I do not see any evidence of infection currently he does have some hypergranulation of the anterior portion of his ankle on the left side this is going require some debridement to pare this down and then subsequently silver nitrate probably due to the amount of bleeding that he is probably going to experience. He is in agreement with this plan however. Objective Constitutional Obese and well-hydrated in no  acute distress. Vitals Time Taken: 11:05 AM, Height: 70 in, Weight: 380.2 lbs, BMI: 54.5, Temperature: 98.1 F, Pulse: 71 bpm, Respiratory Rate: 22 breaths/min, Blood Pressure: 134/62 mmHg, Capillary Blood Glucose: 182 mg/dl. Respiratory normal breathing without difficulty. Psychiatric this patient is able to make decisions and demonstrates good insight into disease process. Alert and Oriented x 3. pleasant and cooperative. General Notes: Upon inspection patient's wounds appear to be doing quite well and in fact this seems much better than when Dr. Leanord Hawking saw him last which is great news as well. Integumentary (Hair, Skin) Wound #198 status is Open. Original cause of wound was Blister. The date acquired was: 08/08/2021. The wound has been in treatment 6 weeks. The wound is located on the Right,Posterior Lower Leg. The wound measures 3cm length x 2cm width x 0.1cm depth; 4.712cm^2 area and 0.471cm^3 volume. There is Fat Layer (Subcutaneous Tissue) exposed. There is no tunneling or undermining noted. There is a medium amount of serosanguineous drainage noted. The wound margin is distinct with the outline attached to the wound base. There is large (67-100%) red, pink granulation within the wound bed. There is no necrotic tissue within the wound bed. Wound #199 status is Open. Original cause of wound was Gradually Appeared. The date acquired was: 08/08/2021. The wound has been in treatment 6 weeks. The wound is located on the Right T Third. The wound measures 1.2cm length x 1.5cm width x 0.1cm depth; 1.414cm^2 area and 0.141cm^3 volume. There is oe Fat Layer (Subcutaneous Tissue) exposed. There is no tunneling or undermining noted. There is a medium amount of serous drainage noted. The  wound margin is distinct with the outline attached to the wound base. There is large (67-100%) red, pink granulation within the wound bed. There is no necrotic tissue within the wound bed. Wound #200 status is Open.  Original cause of wound was Gradually Appeared. The date acquired was: 08/20/2021. The wound has been in treatment 4 weeks. The wound is located on the Left,Medial Lower Leg. The wound measures 2cm length x 1cm width x 0.1cm depth; 1.571cm^2 area and 0.157cm^3 volume. There is Fat Layer (Subcutaneous Tissue) exposed. There is no tunneling noted. There is a medium amount of serosanguineous drainage noted. The wound margin is distinct with the outline attached to the wound base. There is large (67-100%) red granulation within the wound bed. There is no necrotic tissue within the wound bed. Wound #201 status is Open. Original cause of wound was Gradually Appeared. The date acquired was: 08/20/2021. The wound has been in treatment 4 weeks. The wound is located on the Right,Anterior Lower Leg. The wound measures 5.7cm length x 7cm width x 0.1cm depth; 31.337cm^2 area and 3.134cm^3 volume. There is Fat Layer (Subcutaneous Tissue) exposed. There is no tunneling or undermining noted. There is a large amount of serous drainage noted. The wound margin is distinct with the outline attached to the wound base. There is large (67-100%) red granulation within the wound bed. There is no necrotic tissue within the wound bed. Wound #202 status is Open. Original cause of wound was Gradually Appeared. The date acquired was: 08/20/2021. The wound has been in treatment 4 weeks. The wound is located on the Right T Fourth. The wound measures 0.4cm length x 0.5cm width x 0.1cm depth; 0.157cm^2 area and 0.016cm^3 volume. There oe is Fat Layer (Subcutaneous Tissue) exposed. There is no tunneling or undermining noted. There is a medium amount of serous drainage noted. The wound margin is distinct with the outline attached to the wound base. There is large (67-100%) red granulation within the wound bed. There is a small (1-33%) amount of necrotic tissue within the wound bed including Adherent Slough. Wound #203 status is Open.  Original cause of wound was Gradually Appeared. The date acquired was: 08/20/2021. The wound has been in treatment 4 weeks. The wound is located on the Left,Anterior Ankle. The wound measures 1cm length x 1.4cm width x 0.1cm depth; 1.1cm^2 area and 0.11cm^3 volume. There is Fat Layer (Subcutaneous Tissue) exposed. There is no tunneling or undermining noted. There is a medium amount of serosanguineous drainage noted. The wound margin is distinct with the outline attached to the wound base. There is large (67-100%) red granulation within the wound bed. There is a small (1-33%) amount of necrotic tissue within the wound bed including Adherent Slough. 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 There was a Excisional Skin/Subcutaneous Tissue Debridement with a total area of 1.4 sq cm performed by Larina Bras  III, Leonard Schwartz, [place]. With the following instrument(s): Curette to remove Viable and Non-Viable tissue/material. Material removed includes Subcutaneous Tissue, Slough, Skin: Dermis, Skin: Epidermis, Fibrin/Exudate, and Hyper-granulation after achieving pain control using Other (Benzocaine 20%). A time out was conducted at 11:45, prior to the start of the procedure. A Moderate  amount of bleeding was controlled with Silver Nitrate. The procedure was tolerated well with a pain level of 0 throughout and a pain level of 0 following the procedure. Post Debridement Measurements: 1cm length x 1.4cm width x 0.2cm depth; 0.22cm^3 volume. Character of Wound/Ulcer Post Debridement is improved. Post procedure Diagnosis Wound #203: Same as Pre-Procedure Pre-procedure diagnosis of Wound #203 is a Lymphedema located on the Left,Anterior Ankle . There was a Four Layer Compression Therapy Procedure by Shawn Stall, RN. Post procedure Diagnosis Wound #203: Same as Pre-Procedure Wound #198 Pre-procedure diagnosis of Wound #198 is a Lymphedema located on the Right,Posterior Lower Leg . There was a Four Layer Compression Therapy Procedure by Shawn Stall, RN. Post procedure Diagnosis Wound #198: Same as Pre-Procedure Wound #200 Pre-procedure diagnosis of Wound #200 is a Lymphedema located on the Left,Medial Lower Leg . There was a Four Layer Compression Therapy Procedure by Shawn Stall, RN. Post procedure Diagnosis Wound #200: Same as Pre-Procedure Wound #201 Pre-procedure diagnosis of Wound #201 is a Lymphedema located on the Right,Anterior Lower Leg . There was a Four Layer Compression Therapy Procedure by Shawn Stall, RN. Post procedure Diagnosis Wound #201: Same as Pre-Procedure Plan Follow-up Appointments: Return Appointment in 1 week. Leonard Schwartz Wednesday Bathing/ Shower/ Hygiene: May shower with protection but do not get wound dressing(s) wet. Edema Control - Lymphedema / SCD / Other: Lymphedema Pumps. Use Lymphedema pumps on leg(s) 2-3 times a day for 45-60 minutes. If wearing any wraps or hose, do not remove them. Continue exercising as instructed. Elevate legs to the level of the heart or above for 30 minutes daily and/or when sitting, a frequency of: - throughout the day Avoid standing for long periods of time. Exercise regularly WOUND #198: - Lower Leg Wound  Laterality: Right, Posterior Cleanser: Soap and Water 1 x Per Week/ Discharge Instructions: May shower and wash wound with dial antibacterial soap and water prior to dressing change. Cleanser: Wound Cleanser 1 x Per Week/ Discharge Instructions: Cleanse the wound with wound cleanser prior to applying a clean dressing using gauze sponges, not tissue or cotton balls. Peri-Wound Care: Zinc Oxide Ointment 30g tube 1 x Per Week/ Discharge Instructions: Apply Zinc Oxide to periwound with each dressing change Peri-Wound Care: Sween Lotion (Moisturizing lotion) 1 x Per Week/ Discharge Instructions: Apply moisturizing lotion as directed Prim Dressing: KerraCel Ag Gelling Fiber Dressing, 4x5 in (silver alginate) 1 x Per Week/ ary Discharge Instructions: Apply silver alginate to wound bed as instructed Secondary Dressing: ABD Pad, 8x10 1 x Per Week/ Discharge Instructions: Apply over primary dressing as directed. Com pression Wrap: FourPress (4 layer compression wrap) 1 x Per Week/ Discharge Instructions: ****UNNA BOOT FIRST LAYER APPLIED TO UPPER PORTION OF LOWER LEG.*** WOUND #199: - T Third Wound Laterality: Right oe Cleanser: Soap and Water 1 x Per Week/ Discharge Instructions: May shower and wash wound with dial antibacterial soap and water prior to dressing change. Cleanser: Wound Cleanser 1 x Per Week/ Discharge Instructions: Cleanse the wound with wound cleanser prior to applying a clean dressing using gauze sponges, not tissue or cotton balls. Peri-Wound Care: Zinc Oxide Ointment 30g tube 1 x Per Week/ Discharge Instructions: Apply Zinc Oxide to periwound  with each dressing change Peri-Wound Care: Sween Lotion (Moisturizing lotion) 1 x Per Week/ Discharge Instructions: Apply moisturizing lotion as directed Prim Dressing: KerraCel Ag Gelling Fiber Dressing, 4x5 in (silver alginate) 1 x Per Week/ ary Discharge Instructions: Apply silver alginate to wound bed as instructed Secured With:  Conforming Stretch Gauze Bandage, Sterile 2x75 (in/in) 1 x Per Week/ Discharge Instructions: Secure with stretch gauze as directed. Secured With: 68M Medipore Scientist, research (life sciences) Surgical T 2x10 (in/yd) 1 x Per Week/ ape Discharge Instructions: Secure with tape as directed. WOUND #200: - Lower Leg Wound Laterality: Left, Medial Cleanser: Soap and Water 1 x Per Week/ Discharge Instructions: May shower and wash wound with dial antibacterial soap and water prior to dressing change. Cleanser: Wound Cleanser 1 x Per Week/ Discharge Instructions: Cleanse the wound with wound cleanser prior to applying a clean dressing using gauze sponges, not tissue or cotton balls. Peri-Wound Care: Zinc Oxide Ointment 30g tube 1 x Per Week/ Discharge Instructions: Apply Zinc Oxide to periwound with each dressing change Peri-Wound Care: Sween Lotion (Moisturizing lotion) 1 x Per Week/ Discharge Instructions: Apply moisturizing lotion as directed Prim Dressing: KerraCel Ag Gelling Fiber Dressing, 4x5 in (silver alginate) 1 x Per Week/ ary Discharge Instructions: Apply silver alginate to wound bed as instructed Secondary Dressing: ABD Pad, 8x10 1 x Per Week/ Discharge Instructions: Apply over primary dressing as directed. Com pression Wrap: FourPress (4 layer compression wrap) 1 x Per Week/ Discharge Instructions: ****UNNA BOOT FIRST LAYER APPLIED TO UPPER PORTION OF LOWER LEG.*** WOUND #201: - Lower Leg Wound Laterality: Right, Anterior Cleanser: Soap and Water 1 x Per Week/ Discharge Instructions: May shower and wash wound with dial antibacterial soap and water prior to dressing change. Cleanser: Wound Cleanser 1 x Per Week/ Discharge Instructions: Cleanse the wound with wound cleanser prior to applying a clean dressing using gauze sponges, not tissue or cotton balls. Peri-Wound Care: Zinc Oxide Ointment 30g tube 1 x Per Week/ Discharge Instructions: Apply Zinc Oxide to periwound with each dressing change Peri-Wound  Care: Sween Lotion (Moisturizing lotion) 1 x Per Week/ Discharge Instructions: Apply moisturizing lotion as directed Prim Dressing: KerraCel Ag Gelling Fiber Dressing, 4x5 in (silver alginate) 1 x Per Week/ ary Discharge Instructions: Apply silver alginate to wound bed as instructed Secondary Dressing: ABD Pad, 8x10 1 x Per Week/ Discharge Instructions: Apply over primary dressing as directed. Com pression Wrap: FourPress (4 layer compression wrap) 1 x Per Week/ Discharge Instructions: ****UNNA BOOT FIRST LAYER APPLIED TO UPPER PORTION OF LOWER LEG.*** WOUND #202: - T Fourth Wound Laterality: Right oe Cleanser: Soap and Water 1 x Per Week/ Discharge Instructions: May shower and wash wound with dial antibacterial soap and water prior to dressing change. Cleanser: Wound Cleanser 1 x Per Week/ Discharge Instructions: Cleanse the wound with wound cleanser prior to applying a clean dressing using gauze sponges, not tissue or cotton balls. Peri-Wound Care: Zinc Oxide Ointment 30g tube 1 x Per Week/ Discharge Instructions: Apply Zinc Oxide to periwound with each dressing change Peri-Wound Care: Sween Lotion (Moisturizing lotion) 1 x Per Week/ Discharge Instructions: Apply moisturizing lotion as directed Prim Dressing: KerraCel Ag Gelling Fiber Dressing, 4x5 in (silver alginate) 1 x Per Week/ ary Discharge Instructions: Apply silver alginate to wound bed as instructed Secured With: Conforming Stretch Gauze Bandage, Sterile 2x75 (in/in) 1 x Per Week/ Discharge Instructions: Secure with stretch gauze as directed. Secured With: 68M Medipore Scientist, research (life sciences) Surgical T 2x10 (in/yd) 1 x Per Week/ ape Discharge Instructions: Secure with  tape as directed. WOUND #203: - Ankle Wound Laterality: Left, Anterior Cleanser: Soap and Water 1 x Per Week/ Discharge Instructions: May shower and wash wound with dial antibacterial soap and water prior to dressing change. Cleanser: Wound Cleanser 1 x Per Week/ Discharge  Instructions: Cleanse the wound with wound cleanser prior to applying a clean dressing using gauze sponges, not tissue or cotton balls. Peri-Wound Care: Zinc Oxide Ointment 30g tube 1 x Per Week/ Discharge Instructions: Apply Zinc Oxide to periwound with each dressing change Peri-Wound Care: Sween Lotion (Moisturizing lotion) 1 x Per Week/ Discharge Instructions: Apply moisturizing lotion as directed Prim Dressing: KerraCel Ag Gelling Fiber Dressing, 4x5 in (silver alginate) 1 x Per Week/ ary Discharge Instructions: Apply silver alginate to wound bed as instructed Secondary Dressing: ABD Pad, 8x10 1 x Per Week/ Discharge Instructions: Apply over primary dressing as directed. Com pression Wrap: FourPress (4 layer compression wrap) 1 x Per Week/ Discharge Instructions: ****UNNA BOOT FIRST LAYER APPLIED TO UPPER PORTION OF LOWER LEG.*** 1. I would recommend currently that we go ahead and continue with the wound care measures as before pacifically with alginate to the anterior portion of the left ankle region where I did have to debride today. 2. I am also can recommend silver alginate be continued to the other wound locations as well. In general I think he is doing great and overall very pleased but nonetheless we need to keep a close eye on this. He can definitely make shifts fairly quickly. I think were getting much more back into a stable realm however. We will see patient back for reevaluation in 1 week here in the clinic. If anything worsens or changes patient will contact our office for additional recommendations. Electronic Signature(s) Signed: 09/19/2021 3:25:27 PM By: Lenda Kelp PA-C Entered By: Lenda Kelp on 09/19/2021 15:25:26 -------------------------------------------------------------------------------- SuperBill Details Patient Name: Date of Service: [name], Juandedios J. 09/19/2021 Medical Record Number: [id number] Patient Account Number: [id number] Date of Birth/Sex:  Treating RN: 05-Aug-1951 (70 y.o. 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 hypertension (idiopathic) with ulcer and inflammation of bilateral lower extremity L03.116 Cellulitis of left lower limb I89.0 Lymphedema, not elsewhere classified E11.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, unspecified E11.622 Type 2 diabetes mellitus with other skin ulcer L03.115 Cellulitis of right lower limb Facility Procedures CPT4 Code: [id number] Description: 11042 - DEB SUBQ TISSUE 20 SQ CM/< ICD-10 Diagnosis Description L97.521 Non-pressure chronic ulcer of other 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 of skin Modifier: Quantity: 1 CPT4 Code: [id number] Description: (Facility Use Only) [id number] - APPLY MULTLAY COMPRS LWR RT LEG Modifier: 59 Quantity: 1 Physician Procedures : CPT4 Code Description Modifier [id number] 11042 - WC PHYS SUBQ TISS 20 SQ CM ICD-10 Diagnosis Description L97.521 Non-pressure chronic ulcer of other 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 of skin Quantity: 1 Electronic Signature(s) Signed: 09/19/2021 3:25:38 PM By: Lenda Kelp PA-C Entered By: Lenda Kelp on 09/19/2021 15:25:36
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
## 2021-09-19 NOTE — Progress Notes (Signed)
OFFICE VISIT  09/19/2021  CC:  Chief Complaint  Patient presents with   Follow-up    Hypertension. Pt would like to know if he need to continue with Valsartan    HPI:    Patient is a 70 y.o. male who presents for 6 wk f/u uncontrolled HTN. A/P as of last visit: "1) Uncontrolled HTN.  His HR is now very good since getting off BB and lowering diltiazem dose. BP still not at goal with amlod 10 qd, lisinopril 40 qd, diltiazem 120 qd, clonidine 0.1mg  bid, and hytrin 10 qd. Will increase clonidine to 0.3mg  tid and continue all other meds as-is. He does not want to do clonidine patch b/c he's afraid it won't stay on his skin. He will send me bp numbers via mychart in a couple weeks."  INTERIM HX: Patient has been updating me of his blood pressures at home via [employer]. Pressure still about the same on October 13 so I switched him over from lisinopril to valsartan with 320 mg a day. The week following that his blood pressures did improve some but his creatinine jumped from 1.5-1.9. I did not change any dosing at that time. A week later his blood pressure report was still [id number] systolic.  Creatinine stayed at 1.9.  I added Imdur 10 mg a day at that time and recommended he cut back his Lasix to 1-1/2 tabs per day. Then on November 11 his creatinine was back down to 1.6 and I increased his Imdur to 30 mg a day.  He has remained on clonidine 0.3 3 times daily, Hytrin 10 daily, diltiazem 120 daily, and amlodipine 10/day. He is not on beta-blocker due to history of bradycardia.   Most recent home bp avg180 syst, 80 diast, HR avg 65 He reports compliance with medications as prescribed.  He is struggling quite a bit with his social situation.  Is facing eviction from his apartment, recently had to get a lawyer to postpone his parents with the magistrate.  Has no help to move, feels trapped and helpless.  Obviously this causes him distress and he feels overwhelmed, sad, hopeless, feels like  he would be better off dead.  No suicidal ideation or plan.  No HI.  He says past trials of antidepressant have resulted in him feeling overmedicated and he only took them 2 to 3 days.  He is not in counseling. PHQ9 today->18, very difficult.  ROS as above, plus--> no fevers, no CP, no SOB, no wheezing, no cough, no dizziness, no HAs, no rashes, no melena/hematochezia.  No polyuria or polydipsia.  No myalgias or arthralgias.  No focal weakness, paresthesias, or tremors.  No acute vision or hearing abnormalities.  No dysuria or unusual/new urinary urgency or frequency.  No recent changes in lower legs. No n/v/d or abd pain.  No palpitations.     Past Medical History:  Diagnosis Date   ALLERGIC RHINITIS 08/11/2006   ASTHMA 08/11/2006   Bradycardia    Beta blocker d/c'd 2022   Chronic atrial fibrillation ([place]) 08/2014   Chronic combined systolic and diastolic CHF (congestive heart failure) [facility])    Cardiology f/u 12/2017: pt volume overloaded (R heart dysf suspected), BNP very high, lasix increased.  Repeat echo 12/2017: normal LV EF, mild DD, +RV syst dysfxn, mod pulm HTN, biatrial enlgmt.   Chronic constipation    Chronic renal insufficiency, stage 2 (mild)    Borderline stage III (GFR 60s).   Colon cancer screening 02/2021   02/2021 Cologuard  POS->GI ref   DIABETES MELLITUS, TYPE II 08/11/2006   DM W/RENAL MANIFESTATIONS, TYPE II 04/20/2007   HYPERTENSION 08/11/2006   Impaired mobility and endurance    MRSA infection 06/2018   LL venous stasis ulcer infected   Normocytic anemia 2016-2019   03/2018 B12 normal, iron ok (ferritin borderline low).  Plan repeat CBC at f/u summer 2019 and if decreased from baseline will check hemoccults and iron labs again.   OBESITY, MORBID 12/14/2007   saxenda started 05/2020 by [facility] wt mgmt center   OSA (obstructive sleep apnea)    to get sleep study with [facility]) as of 12/03/2018 consult.   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  [id number] ID started him on amoxil prophylaxis---ineffective.  End 2018/Jan 2019 penicillin G injections prophyl helpful but pt declined to continue this as of 01/2018 ID f/u.  ID talked him into resuming monthly penicillin G as of 02/2018 f/u.     Restless leg syndrome    Rx'd clonazepam 09/2017 and pt refused to take it after reading the medication's potential side effects.   Venous stasis ulcers of both lower extremities (HCC)    Severe lymphedema.  wound clinic care ongoing as of 01/2018    Past Surgical History:  Procedure Laterality Date   Carotid dopplers  07/23/2018   Left NORMAL.  Right 1-39% ICA stenosis, with <50% distal CCA stenosis (not hemodynamically significant)   TRANSTHORACIC ECHOCARDIOGRAM  11/2007; 09/2014; 11/2015;12/2017   LV fxn normal, EF normal, mild dilation of left atrium.  2015 grade II diast dysfxn.  2017 EF 55-60%. 2019: LVEF 60-65%, mild RV syst dysf,biatrial enlgmt, mod pulm htn.   URETERAL STENT PLACEMENT     virtual colonoscopy  01/2011   Normal    Outpatient Medications Prior to Visit  Medication Sig Dispense Refill   amLODipine (NORVASC) 10 MG tablet Take 1 tablet (10 mg total) by mouth daily. 90 tablet 3   arginine 500 MG tablet Take by mouth.     b complex vitamins capsule Take 1 capsule by mouth every morning.     butalbital-acetaminophen-caffeine (FIORICET WITH CODEINE) 50-325-40-30 MG capsule TAKE 1 TO 2 CAPSULES BY  MOUTH EVERY 6 HOURS AS  NEEDED FOR HEADACHE(S)  MANUFACTURER RECOMMENDS NOT EXCEEDING 6 CAPSULES/DAY 60 capsule 0   cetirizine (ZYRTEC) 10 MG tablet Take 10 mg by mouth daily.     CHROMIUM GTF PO Take by mouth daily.     cloNIDine (CATAPRES) 0.3 MG tablet Take 1 tablet (0.3 mg total) by mouth 3 (three) times daily. 90 tablet 3   Coenzyme Q10 (CO Q 10) 10 MG CAPS Take by mouth. Reported on 02/19/2016     diltiazem (TIAZAC) 120 MG 24 hr capsule Take 1 capsule (120 mg total) by mouth  daily. 90 capsule 3   furosemide (LASIX) 40 MG tablet TAKE 2 TABLETS BY MOUTH  TWICE DAILY 360 tablet 1   Glutamine 500 MG CAPS Take by mouth.     HYDROcodone-acetaminophen (NORCO/VICODIN) 5-325 MG tablet 1-2 tabs po bid prn pain 60 tablet 0   Insulin Lispro Prot & Lispro (HUMALOG MIX 75/25 KWIKPEN) (75-25) 100 UNIT/ML Kwikpen INJECT SUBCUTANEOUSLY 25  UNITS EVERY MORNING AND 20  UNITS EVERY EVENING AT  SUPPER 45 mL 3   Insulin Pen Needle (B-D ULTRAFINE III SHORT PEN) 31G X 8 MM MISC USE 1 PENNEEDLE TWICE DAILY 200 each 1   isosorbide mononitrate (IMDUR) 30 MG  24 hr tablet Take 1 tablet (30 mg total) by mouth daily. 30 tablet 0   metFORMIN (GLUCOPHAGE) 1000 MG tablet TAKE 1 TABLET BY MOUTH  TWICE DAILY WITH MEALS 180 tablet 1   Multiple Vitamin (MULTIVITAMIN) tablet Take 1 tablet by mouth daily.     ONETOUCH ULTRA test strip CHECK BLOOD SUGAR TWICE  DAILY 200 strip 3   rivaroxaban (XARELTO) 20 MG TABS tablet TAKE 1 TABLET BY MOUTH ONCE DAILY WITH SUPPER 90 tablet 3   terazosin (HYTRIN) 10 MG capsule TAKE 1 CAPSULE BY MOUTH  ONCE DAILY AT BEDTIME 90 capsule 3   vitamin E 400 UNIT capsule Take 400 Units by mouth every morning. Selenium 50mg      valsartan (DIOVAN) 320 MG tablet Take 1 tablet (320 mg total) by mouth daily. (Patient not taking: Reported on 09/19/2021) 30 tablet 0   No facility-administered medications prior to visit.    Allergies  Allergen Reactions   Other Other (See Comments)    Sneezing, coughing   Hydralazine Other (See Comments)    Drowsiness/sedation/mental fog    ROS As per HPI  PE: Vitals with BMI 09/19/2021 08/03/2021 07/26/2021  Height 5\' 10"  5\' 10"  5\' 10"   Weight 395 lbs 388 lbs 6 oz 380 lbs  BMI 56.68 55.73 54.52  Systolic 124 123 07/28/2021  Diastolic 60 72 53  Pulse 65 72 56     Gen: Alert, well appearing.  Patient is oriented to person, place, time, and situation. AFFECT: weeping softly, lucid thought and speech. No further exam today.  LABS:     Chemistry      Component Value Date/Time   NA 139 09/14/2021 1111   NA 141 02/09/2020 1453   K 4.7 09/14/2021 1111   CL 100 09/14/2021 1111   CO2 27 09/14/2021 1111   BUN 26 (H) 09/14/2021 1111   BUN 20 02/09/2020 1453   CREATININE 1.66 (H) 09/14/2021 1111   CREATININE 1.57 (H) 04/04/2021 0844      Component Value Date/Time   CALCIUM 8.5 09/14/2021 1111   ALKPHOS 70 07/12/2020 1015   AST 15 04/04/2021 0844   ALT 11 04/04/2021 0844   BILITOT 1.0 04/04/2021 0844     Lab Results  Component Value Date   WBC 8.1 04/04/2021   HGB 13.4 04/04/2021   HCT 43.6 04/04/2021   MCV 84.8 04/04/2021   PLT 236 04/04/2021   Lab Results  Component Value Date   HGBA1C 7.9 (H) 07/11/2021   Lab Results  Component Value Date   TSH 4.40 06/08/2018    IMPRESSION AND PLAN:  #1 hypertension,difficult to control---in the setting of CRI III.  Not controlled well per home measurements but blood pressure normal here today.  Suspect there is some inaccuracy of home measurements but hard to tell.  We will hold with current treatment of amlodipine 10 mg daily, clonidine 0.3 3 times daily, diltiazem 120 mg a day, Imdur 30 mg/day, terazosin 10 mg a day, and valsartan 320 mg a day.  He continues on Lasix 60 mg a day for chronic lower extremity edema. Monitoring electrolytes and creatinine today.  #2 anxiety and depression.  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  Current social situation is the biggest factor playing a role.  Also his inability to lose weight weighs heavily on him. He declines antidepressant at this time but is open to counseling so I have made referral  today. Will also reach out to see if there is any social services avenues that we can help with.  DM 2: Next a1c after 10/10/21  Spent 34 min with pt today reviewing HPI, reviewing relevant past history, doing exam, reviewing and discussing lab and imaging  data, and formulating plans.  An After Visit Summary was printed and given to the patient.  FOLLOW UP: Return for keep f/u with me set for 10/10/21.  Signed:  Crissie Sickles, MD           09/19/2021

## 2021-09-20 ENCOUNTER — Telehealth: Payer: Self-pay

## 2021-09-20 NOTE — Progress Notes (Signed)
[name], [name] ([id number]) Visit Report for 09/12/2021 SuperBill Details Patient Name: Date of Service: [name], [name] 09/12/2021 Medical Record Number: [id number] Patient Account Number: [id number] Date of Birth/Sex: Treating RN: [birth year] (70 y.o. [name], [name] Primary Care Provider: Nicoletta Ba Other Clinician: Referring Provider: Treating Provider/Extender: Adele Dan in Treatment: 294 Diagnosis Coding ICD-10 Codes Code Description [phone] Non-pressure chronic ulcer of other 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 of skin L97.829 Non-pressure chronic ulcer of other part of left lower leg with unspecified severity L97.519 Non-pressure chronic ulcer of other part of right foot with unspecified severity L97.812 Non-pressure chronic ulcer of other part of right lower leg with fat layer exposed I87.333 Chronic venous hypertension (idiopathic) with ulcer and inflammation of bilateral lower extremity I89.0 Lymphedema, not elsewhere classified E11.622 Type 2 diabetes mellitus with other skin ulcer L03.115 Cellulitis of right lower limb L03.116 Cellulitis of left lower limb E11.40 Type 2 diabetes mellitus with diabetic neuropathy, unspecified Facility Procedures CPT4 Description Modifier Quantity Code [id number] [phone] BILATERAL: Application of multi-layer venous compression system; leg (below knee), including ankle and 1 foot. Electronic Signature(s) Signed: 09/12/2021 5:29:58 PM By: Shawn Stall RN, BSN Signed: 09/20/2021 5:50:22 PM By: Lenda Kelp PA-C Entered By: Shawn Stall on 09/12/2021 13:31:38

## 2021-09-20 NOTE — Telephone Encounter (Signed)
Patient is sorry that he missed Britt's call a few minutes ago.  Please call back patient when you are available.

## 2021-09-20 NOTE — Telephone Encounter (Signed)
Pollie Meyer, CMA  09/20/2021 11:27 AM EST     Spoke with pt regarding results/recommendations,voiced understanding.

## 2021-09-21 ENCOUNTER — Other Ambulatory Visit: Payer: Self-pay | Admitting: Family Medicine

## 2021-09-25 ENCOUNTER — Encounter (HOSPITAL_COMMUNITY): Payer: Self-pay

## 2021-09-25 ENCOUNTER — Ambulatory Visit (INDEPENDENT_AMBULATORY_CARE_PROVIDER_SITE_OTHER): Payer: Medicare Other | Admitting: Licensed Clinical Social Worker

## 2021-09-25 DIAGNOSIS — F4323 Adjustment disorder with mixed anxiety and depressed mood: Secondary | ICD-10-CM

## 2021-09-25 NOTE — Plan of Care (Signed)
  Problem: Anxiety Disorder CCP Problem  1 Transition/Moving  Goal:  Dayne will manage mood and anxiety as evidenced by coping with daily stressors and coping with the transition to moving to a new place for 5 out of 7 days for 60 days.  Outcome: Not Progressing Goal: STG: Patient will practice problem solving skills 3 times per week for the next 4 weeks Outcome: Not Progressing
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
## 2021-09-26 ENCOUNTER — Other Ambulatory Visit: Payer: Self-pay

## 2021-09-26 ENCOUNTER — Encounter (HOSPITAL_BASED_OUTPATIENT_CLINIC_OR_DEPARTMENT_OTHER): Payer: Medicare Other | Admitting: Physician Assistant

## 2021-09-26 DIAGNOSIS — I890 Lymphedema, not elsewhere classified: Secondary | ICD-10-CM | POA: Diagnosis not present

## 2021-09-26 DIAGNOSIS — I87332 Chronic venous hypertension (idiopathic) with ulcer and inflammation of left lower extremity: Secondary | ICD-10-CM | POA: Diagnosis not present

## 2021-09-26 DIAGNOSIS — L97219 Non-pressure chronic ulcer of right calf with unspecified severity: Secondary | ICD-10-CM | POA: Diagnosis not present

## 2021-09-26 DIAGNOSIS — L97512 Non-pressure chronic ulcer of other part of right foot with fat layer exposed: Secondary | ICD-10-CM | POA: Diagnosis not present

## 2021-09-26 NOTE — Progress Notes (Addendum)
BODI, PALMERI ([id number]) Visit Report for 09/26/2021 Chief Complaint Document Details Patient Name: Date of Service: [name], [name] 09/26/2021 10:30 A M Medical Record Number: [id number] Patient Account Number: [id number] Date of Birth/Sex: Treating RN: 1951/09/02 (70 y.o. [name] Primary Care Provider: Nicoletta Ba Other Clinician: Referring Provider: Treating Provider/Extender: Adele Dan in Treatment: [phone] Information Obtained from: Patient Chief Complaint Patient is here for evaluation of venous/lymphedema ulcers Electronic Signature(s) Signed: 09/26/2021 10:52:19 AM By: Lenda Kelp PA-C Entered By: Lenda Kelp on 09/26/2021 10:52:19 -------------------------------------------------------------------------------- HPI Details Patient Name: Date of Service: Kevin [name], [name] 09/26/2021 10:30 A M Medical Record Number: [id number] Patient Account Number: [id number] Date of Birth/Sex: Treating RN: November 06, 1950 (70 y.o. [name] Primary Care Provider: Nicoletta Ba Other Clinician: Referring Provider: Treating Provider/Extender: Adele Dan in Treatment: 296 History of Present Illness HPI Description: Referred by PCP for consultation. Patient has long standing history of BLE venous stasis, no prior ulcerations. At beginning of month, developed cellulitis and weeping. Received IM Rocephin followed by Keflex and resolved. Wears compression stocking, appr 6 months old. Not sure strength. No present drainage. 01/22/16 this is a patient who is a type II diabetic on insulin. 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 He tells me he religiously wears pressure stockings of uncertain strength. He was here with weeping edema about 8 months ago but did not have an open wound. Roughly a month ago he had a reopening on his bilateral legs. He is been using bandages and Neosporin.  He does not complain of pain. 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 Last BUN/creatinine I have is from 11/20/15 at 13 and 1.0 respectively 01/29/16; patient arrives today having tolerated the Profore wrap. He brought in his stockings and these are 18 mmHg stockings he bought from [place]. The compression here is likely inadequate. He does not complain of pain or excessive drainage she has no systemic symptoms. The wound on the right looks improved as does the one on the left although one on the left is more substantial with still tissue at risk below the actual wound area on the bilateral posterior calf 02/05/16; patient arrives with poor edema control. He states that we did put a 4 layer compression on it last week. No weight appear 5 this. 02/12/16; the area on the posterior right Has healed. The left Has a substantial wound that has necrotic surface eschar that requires a debridement with a curette. 02/16/16;the patient called or a Nurse visit secondary to increased swelling. He had been in earlier in the week with his right leg healed. He was transitioned to is on pressure stocking on the right leg with the only open wound on the left, a substantial area on the left posterior calf. Note he has a history of severe lower extremity edema, he has a history of chronic atrial fibrillation but not heart failure per my notes but I'll need to research this. He is not complaining of chest pain shortness of breath or orthopnea. The intake nurse noted blisters on the previously closed right leg 02/19/16; this is the patient's regular visit day. I see him on Friday with escalating edema new wounds on the right leg and clear signs of at least right ventricular heart failure. I increased his Lasix to 40 twice a  day. He is returning currently in follow-up. States he is noticed a decrease in that the  edema 02/26/16 patient's legs have much less edema. There is nothing really open on the right leg. The left leg has improved condition of the large superficial wound on the posterior left leg 03/04/16; edema control is very much better. The patient's right leg wounds have healed. On the left leg he continues to have severe venous inflammation on the posterior aspect of the left leg. There is no tenderness and I don't think any of this is cellulitis. 03/11/16; patient's right leg is married healed and he is in his own stocking. The patient's left leg has deteriorated somewhat. There is a lot of erythema around the wound on the posterior left leg. There is also a significant rim of erythema posteriorly just above where the wrap would've ended there is a new wound in this location and a lot of tenderness. Can't rule out cellulitis in this area. 03/15/16; patient's right leg remains healed and he is in his own stocking. The patient's left leg is much better than last review. His major wound on the posterior aspect of his left Is almost fully epithelialized. He has 3 small injuries from the wraps. Really. Erythema seems a lot better on antibiotics 03/18/16; right leg remains healed and he is in his own stocking. The patient's left leg is much better. The area on the posterior aspect of the left calf is fully epithelialized. His 3 small injuries which were wrap injuries on the left are improved only one seems still open his erythema has resolved 03/25/16; patient's right leg remains healed and he is in his own stocking. There is no open area today on the left leg posterior leg is completely closed up. His wrap injuries at the superior aspect of his leg are also resolved. He looks as though he has some irritation on the dorsal ankle but this is fully epithelialized without evidence of infection. 03/28/16; we discharged this patient on Monday. Transitioned him into his own stocking. There were problems almost  immediately with uncontrolled swelling weeping edema multiple some of which have opened. He does not feel systemically unwell in particular no chest pain no shortness of breath and he does not feel 04/08/16; the edema is under better control with the Profore light wrap but he still has pitting edema. There is one large wound anteriorly 2 on the medial aspect of his left leg and 3 small areas on the superior posterior calf. Drainage is not excessive he is tolerating a Profore light well 04/15/16; put a Profore wrap on him last week. This is controlled is edema however he had a lot of pain on his left anterior foot most of his wounds are healed 04/22/16 once again the patient has denuded areas on the left anterior foot which he states are because his wrap slips up word. He saw his primary physician today is on Lasix 40 twice a day and states that he his weight is down 20 pounds over the last 3 months. 04/29/16: Much improved. left anterior foot much improved. He is now on Lasix 80 mg per day. Much improved edema control 05/06/16; I was hoping to be able to discharge him today however once again he has blisters at a low level of where the compression was placed last week mostly on his left lateral but also his left medial leg and a small area on the anterior part of the left foot. 05/09/16; apparently the patient  went home after his appointment on 7/4 later in the evening developing pain in his upper medial thigh together with subjective fever and chills although his temperature was not taken. The pain was so intense he felt he would probably have to call 911. However he then remembered that he had leftover doxycycline from a previous round of antibiotics and took these. By the next morning he felt a lot better. He called and spoke to one of our nurses and I approved doxycycline over the phone thinking that this was in relation to the wounds we had previously seen although they were definitely were not. The  patient feels a lot better old fever no chills he is still working. Blood sugars are reasonably controlled 05/13/16; patient is back in for review of his cellulitis on his anterior medial upper thigh. He is taking doxycycline this is a lot better. Culture I did of the nodular area on the dorsal aspect of his foot grew MRSA this also looks a lot better. 05/20/16; the patient is cellulitis on the medial upper thigh has resolved. All of his wound areas including the left anterior foot, areas on the medial aspect of the left calf and the lateral aspect of the calf at all resolved. He has a new blister on the left dorsal foot at the level of the fourth toe this was excised. No evidence of infection 05/27/16; patient continues to complain weeping edema. He has new blisterlike wounds on the left anterior lateral and posterior lateral calf at the top of his wrap levels. The area on his left anterior foot appears better. He is not complaining of fever, pain or pruritus in his feet. 05/30/16; the patient's blisters on his left anterior leg posterior calf all look improved. He did not increase the Lasix 100 mg as I suggested because he was going to run out of his 40 mg tablets. He is still having weeping edema of his toes 06/03/16; I renewed his Lasix at 80 mg once a day as he was about to run out when I last saw him. He is on 80 mg of Lasix now. I have asked him to cut down on the excessive amount of water he was drinking and asked him to drink according to his thirst mechanisms 06/12/2016 -- was seen 2 days ago and was supposed to wear his compression stockings at home but he is developed lymphedema and superficial blisters on the left lower extremity and hence came in for a review 06/24/16; the remaining wound is on his left anterior leg. He still has edema coming from between his toes. There is lymphedema here however his edema is generally better than when I last saw this. He has a history of atrial fibrillation  but does not have a known history of congestive heart failure nevertheless I think he probably has this at least on a diastolic basis. 07/01/16 I reviewed his echocardiogram from January 2017. This was essentially normal. He did not have LVH, EF of 55-60%. His right ventricular function was normal although he did have trivial tricuspid and pulmonic regurgitation. This is not audible on exam however. I increased his Lasix to do massive edema in his legs well above his knees I think in early July. He was also drinking an excessive amount of water at the time. 07/15/16; missed his appointment last week because of the Labor Day holiday on Monday. He could not get another appointment later in the week. Started to feel the wrap digging in superiorly  so we remove the top half and the bottom half of his wrap. He has extensive erythema and blistering superiorly in the left leg. Very tender. Very swollen. Edema in his foot with leaking edema fluid. He has not been systemically unwell 07/22/16; the area on the left leg laterally required some debridement. The medial wounds look more stable. His wrap injury wounds appear to have healed. Edema and his foot is better, weeping edema is also better. He tells me he is meeting with the supplier of the external compression pumps at work 08/05/16; [place] His wrap is been on for an extended period of time. Also over the weekend he developed an extensive area of tender erythema across his anterior medial thigh. He took to doxycycline yesterday that he had leftover from a previous prescription. The patient complains of weeping edema coming out of his toes 08/08/16; I saw this patient on 10/2. He was tender across his anterior thigh. I put him on doxycycline. He returns today in follow-up. He does not have any open wounds on his lower leg, he still has edema weeping into his toes. 08/12/16; patient was seen back urgently today to  follow-up for his extensive left thigh cellulitis/erysipelas. He comes back with a lot less swelling and erythema pain is much better. I believe I gave him Augmentin and Cipro. His wrap was cut down as he stated a roll down his legs. He developed blistering above the level of the wrap that remained. He has 2 open blisters and 1 intact. 08/19/16; patient is been doing his primary doctor who is increased his Lasix from 40-80 once a day or 80 already has less edema. Cellulitis has remained improved in the left thigh. 2 open areas on the posterior left calf 08/26/16; he returns today having new open blisters on the anterior part of his left leg. He has his compression pumps but is not yet been shown how to use some vital representative from the supplier. 09/02/16 patient returns today with no open wounds on the left leg. Some maceration in his plantar toes 09/10/2016 -- Dr. Leanord Hawkingobson had recently discharged him on 09/02/2016 and he has come right back with redness swelling and some open ulcers on his left lower extremity. He says this was caused by trying to apply his compression stockings and he's been unable to use this and has not been able to use his lymphedema pumps. He had some doxycycline leftover and he has started on this a few days ago. 09/16/16; there are no open wounds on his leg on the left and no evidence of cellulitis. 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 He does not complain of symptoms here. I am not clear use using his external compression pumps. 09/23/16; I have not seen this patient in 2 weeks. He canceled his appointment 10 days ago as he was going on vacation. He tells me that on Monday he noticed a large area on his posterior left leg which is been draining copiously and is reopened into a large wound. He is been using ABDs and the external part of his juxtalite, according to our nurse this was not on properly. 10/07/16;  Still a substantial area on the posterior left leg. Using silver alginate 10/14/16; in general better although there is still open area which looks healthy. Still using silver alginate. He reminds me that this happen [place] T oday while he was showering in the morning. He had been using his juxtalite's 10/21/16; the area on his posterior left leg is fully epithelialized. However he arrives today with a large area of tender erythema in his medial and posterior left thigh just above the knee. I have marked the area. Once again he is reluctant to consider hospitalization. I treated him with oral antibiotics in the past for a similar situation with resolution I think with doxycycline however this area it seems more extensive to me. He is not complaining of fever but does have chills and says states he is thirsty. His blood sugar today was in the 140s at home 10/25/16 the area on his posterior left leg is fully epithelialized although there is still some weeping edema. The large area of tenderness and erythema in his medial and posterior left thigh is a lot less tender although there is still a lot of swelling in this thigh. He states he feels a lot better. He is on doxycycline and Augmentin that I started last week. This will continued until Tuesday, December 26. I have ordered a duplex ultrasound of the left thigh rule out DVT whether there is an abscess something that would need to be drained I would also like to know. 11/01/16; he still has weeping edema from a not fully epithelialized area on his left posterior calf. Most of the rest of this looks a lot better. He has completed his antibiotics. His thigh is a lot better. Duplex ultrasound did not show a DVT in the thigh 11/08/16; he comes in today with more Denuded surface epithelium from the posterior aspect of his calf. There is no real evidence of cellulitis. The superior aspect of his wrap appears to have put quite an  indentation in his leg just below the knee and this may have contributed. He does not complain of pain or fever. We have been using silver alginate as the primary dressing. The area of cellulitis in the right thigh has totally resolved. He has been using his compression stockings once a week 11/15/16; the patient arrives today with more loss of epithelium from the posterior aspect of his left calf. He now has a fairly substantial wound in this area. The reason behind this deterioration isn't exactly clear although his edema is not well controlled. He states he feels he is generally more swollen systemically. He is not complaining of chest pain shortness of breath fever. T me he has an appointment with his primary physician in early February. He is on 80 mg of oral ells Lasix a day. He claims compliance with the external compression pumps. He is not having any pain in his legs similar to what he has with his recurrent cellulitis 11/22/16; the patient arrives a follow-up of his large area on his left lateral calf. This looks somewhat better today. He came in earlier in the week for a dressing change since I saw him a week ago. He is not complaining of any pain no shortness of breath no chest pain 11/28/16; the patient arrives for follow-up of his large area on the left lateral calf this does not look better. In fact it is larger weeping edema. The surface of the wound does not look too bad. We have been using silver alginate although I'm not certain that this is a dressing issue. 12/05/16; again the patient follows up for a large wound on the left lateral and left posterior calf this does not look better. There  continues to be weeping edema necrotic surface tissue. More worrisome than this once again there is erythema below the wound involving the distal Achilles and heel suggestive of cellulitis. He is on his feet working most of the day of this is not going well. We are changing his dressing twice a week to  facilitate the drainage. 12/12/16; not much change in the overall dimensions of the large area on the left posterior calf. This is very inflamed looking. I gave him an. Doxycycline last week does not really seem to have helped. He found the wrap very painful indeed it seems to of dog into his legs superiorly and perhaps around the heel. He came in early today because the drainage had soaked through his dressings. 12/19/16- patient arrives for follow-up evaluation of his left lower extremity ulcers. He states that he is using his lymphedema pumps once daily when there is "no drainage". He admits to not using his lipedema pumps while under current treatment. His blood sugars have been consistently between 150-200. 12/26/16; the patient is not using his compression pumps at home because of the wetness on his feet. I've advised him that I think it's important for him to use this daily. He finds his feet too wet, he can put a plastic bag over his legs while he is in the pumps. Otherwise I think will be in a vicious circle. We are using silver alginate to the major area on his left posterior calf 01/02/17; the patient's posterior left leg has further of all into 3 open wounds. All of them covered with a necrotic surface. He claims to be using his compression pumps once a day. His edema control is marginal. Continue with silver alginate 01/10/17; the patient's left posterior leg actually looks somewhat better. There is less edema, less erythema. Still has 3 open areas covered with a necrotic surface requiring debridement. He claims to be using his compression pumps once a day his edema control is better 01/17/17; the patient's left posterior calf look better last week when I saw him and his wrap was changed 2 days ago. He has noted increasing pain in the left heel and arrives today with much larger wounds extensive erythema extending down into the entire heel area especially tender medially. He is not  systemically unwell CBGs have been controlled no fever. Our intake nurse showed me limegreen drainage on his AVD pads. 01/24/17; his usual this patient responds nicely to antibiotics last week giving him Levaquin for presumed Pseudomonas. The whole entire posterior part of his leg is much better much less inflamed and in the case of his Achilles heel area much less tender. He has also had some epithelialization posteriorly there are still open areas here and still draining but overall considerably better 01/31/17- He has continue to tolerate the compression wraps. he states that he continues to use the lymphedema pumps daily, and can increase to twice daily on the weekends. He is voicing no complaints or concerns regarding his LLE ulcers 02/07/17-he is here for follow-up evaluation. He states that he noted some erythema to the left medial and anterior thigh, which he states is new as of yesterday. He is concerned about recurrent cellulitis. He states his blood sugars have been slightly elevated, this morning in the 180s 02/14/17; he is here for follow-up evaluation. When he was last here there was erythema superiorly from his posterior wound in his anterior thigh. He was prescribed Levaquin however a culture of the wound surface grew MRSA over  the phone I changed him to doxycycline on Monday and things seem to be a lot better. 02/24/17; patient missed his appointment on Friday therefore we changed his nurse visit into a physician visit today. Still using silver alginate on the large area of the posterior left thigh. He isn't new area on the dorsal left second toe 03/03/17; actually better today although he admits he has not used his external compression pumps in the last 2 days or so because of work responsibilities over the weekend. 03/10/17; continued improvement. External compression pumps once a day almost all of his wounds have closed on the posterior left calf. Better edema control 03/17/17; in general  improved. He still has 3 small open areas on the lateral aspect of his left leg however most of the area on the posterior part of his leg is epithelialized. He has better edema control. He has an ABD pad under his stocking on the right anterior lower leg although he did not let us look at that today. 03/24/17; patient arrives back in clinic today with no open areas however there are areas on the posterior left calf and anterior left calf that are less than 100% epithelialized. His edema is well controlled in the left lower leg. There is some pitting edema probably lymphedema in the left upper thigh. He uses compression pumps at home once per day. I tried to get him to do this twice a day although he is very reticent. 04/01/2017 -- for the last 2 days he's had significant redness, tenderness and weeping and came in for an urgent visit today. 04/07/17; patient still has 6 more days of doxycycline. He was seen by Dr. Meyer Russel last Wednesday for cellulitis involving the posterior aspect, lateral aspect of his Involving his heel. For the most part he is better there is less erythema and less weeping. He has been on his feet for 12 hours 2 over the weekend. Using his compression pumps once a day 04/14/17 arrives today with continued improvement. Only one area on the posterior left calf that is not fully epithelialized. He has intense bilateral venous inflammation associated with his chronic venous insufficiency disease and secondary lymphedema. We have been using silver alginate to the left posterior calf wound In passing he tells [place] today that the right leg but we have not seen in quite some time has an open area on it but he doesn't want [place] to look at this today states he will show this to [place] next week. 04/21/17; there is no open area on his left leg although he still reports some weeping edema. He showed [place] his right leg today which is the first time we've seen this leg in a long time. He has a large area of  open wound on the right leg anteriorly healthy granulation. Quite a bit of swelling in the right leg and some degree of venous inflammation. He told us about the right leg in passing last week but states that deterioration in the right leg really only happened over the weekend 04/28/17; there is no open area on the left leg although there is an irritated part on the posterior which is like a wrap injury. The wound on the right leg which was new from last week at least to [place] is a lot better. 05/05/17; still no open area on the left leg. Patient is using his new compression stocking which seems to be doing a good job of controlling the edema. He states he is using his  compression pumps once per day. The right leg still has an open wound although it is better in terms of surface area. Required debridement. A lot of pain in the posterior right Achilles marked tenderness. Usually this type of presentation this patient gives concern for an active cellulitis 05/12/17; patient arrives today with his major wound from last week on the right lateral leg somewhat better. Still requiring debridement. He was using his compression stocking on the left leg however that is reopened with superficial wounds anteriorly he did not have an open wound on this leg previously. He is still using his juxta light's once daily at night. He cannot find the time to do this in the morning as he has to be at work by 7 AM 05/19/17; right lateral leg wound looks improved. No debridement required. The concerning area is on the left posterior leg which appears to almost have a subcutaneous hemorrhagic component to it. We've been using silver alginate to all the wounds 05/26/17; the right lateral leg wound continues to look improved. However the area on the left posterior calf is a tightly adherent surface. [place] using silver alginate. Because of the weeping edema in his legs there is very little good alternatives. 06/02/17; the patient left  here last week looking quite good. Major wound on the left posterior calf and a small one on the right lateral calf. Both of these look satisfactory. He tells me that by Wednesday he had noted increased pain in the left leg and drainage. He called on Thursday and Friday to get an appointment here but we were blocked. He did not go to urgent care or his primary physician. He thinks he had a fever on Thursday but did not actually take his temperature. He has not been using his compression pumps on the left leg because of pain. I advised him to go to the emergency room today for IV antibiotics for stents of left leg cellulitis but he has refused I have asked him to take 2 days off work to keep his leg elevated and he has refused this as well. In view of this I'm going to call him and Augmentin and doxycycline. 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 However after a detailed discussion regarding the risk benefits and alternatives he refuses to get admitted to the hospital. With no other recourse I will continue him on oral antibiotics as before and hopefully he'll have his infectious disease consultation this week. 06/16/2017 -- the patient was seen today by the nurse practitioner at infectious disease Ms. Dixon. 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 He has also been advise Lotrimin cream locally. she also had wise IV antibiotics for his condition if it flares up 06/23/17; patient arrives today with drainage bilaterally although the remaining wound on the left posterior calf after  cleaning up today "highlighter yellow drainage" did not look too bad. Unfortunately he has had breakdown on the right anterior leg [previously this leg had not been open and he is using a black stocking] he went to see infectious  disease and is been put on clindamycin 150 mg daily, I did not verify the dose although I'm not familiar with using clindamycin in this dosing range, perhaps for prophylaxisoo 06/27/17; I brought this patient back today to follow-up on the wound deterioration on the right lower leg together with surrounding cellulitis. I started him on doxycycline 4 days ago. This area looks better however he comes in today with intense cellulitis on the medial part of his left thigh. This is not have a wound in this area. Extremely tender. We've been using silver alginate to the wounds on the right lower leg left lower leg with bilateral 4 layer compression he is using his external compression pumps once a day 07/04/17; patient's left medial thigh cellulitis looks better. He has not been using his compression pumps as his insert said it was contraindicated with cellulitis. His right leg continues to make improvements all the wounds are still open. We only have one remaining wound on the left posterior calf. Using silver alginate to all open areas. He is on doxycycline which I started a week ago and should be finishing I gave him Augmentin after Thursday's visit for the severe cellulitis on the left medial thigh which fortunately looks better 07/14/17; the patient's left medial thigh cellulitis has resolved. The cellulitis in his right lower calf on the right also looks better. All of his wounds are stable to improved we've been using silver alginate he has completed the antibiotics I have given him. He has clindamycin 150 mg once a day prescribed by infectious disease for prophylaxis, I've advised him to start this now. We have been using bilateral Unna boots over silver alginate to the wound  areas 07/21/17; the patient is been to see infectious disease who noted his recurrent problems with cellulitis. He was not able to tolerate prophylactic clindamycin therefore he is on amoxicillin 500 twice a day. He also had a second daily dose of Lasix added By Dr. Oneta Rack but he is not taking this. Nor is he being completely compliant with his compression pumps a especially not this week. He has 2 remaining wounds one on the right posterior lateral lower leg and one on the left posterior medial lower leg. 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 The patient has Unna boots bilaterally. 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 He has bilateral Unna boots for compression and silver alginate to his wounds. Arrives today with his legs looking as good as I have seen him in quite some time. Not surprisingly his wounds look better as well with improvement on the right lateral leg venous insufficiency wound and also the left medial leg. He is still using the compression pumps once a day 08/11/17; both legs appear to be doing better wounds on the right lateral and left medial legs look better. Skin on the right leg quite good. He is been using silver alginate as the primary dressing. I'm going to use Anasept gel calcium alginate and maintain all the secondary dressings 08/18/17; the patient continues to actually do quite well. The area on his right lateral leg is just about closed the left medial also looks better although it is still moist in this area. His edema is well controlled we have been using Anasept gel with calcium alginate  and the usual secondary dressings, 4 layer compression and once daily use of his compression pumps "always been able to manage 09/01/17; the patient  continues to do reasonably well in spite of his trip to [place]. The area on the right lateral leg is epithelialized. Left is much better but still open. He has more edema and more chronic erythema on the left leg [venous inflammation] 09/08/17; he arrives today with no open wound on the right lateral leg and decently controlled edema. Unfortunately his left leg is not nearly as in his good situation as last week.he apparently had increasing edema starting on Saturday. He edema soaked through into his foot so used a plastic bag to walk around his home. The area on the medial right leg which was his open area is about the same however he has lost surface epithelium on the left lateral which is new and he has significant pain in the Achilles area of the left foot. He is already on amoxicillin chronically for prophylaxis of cellulitis in the left leg 09/15/17; he is completed a week of doxycycline and the cellulitis in the left posterior leg and Achilles area is as usual improved. He still has a lot of edema and fluid soaking through his dressings. There is no open wound on the right leg. He saw infectious disease NP today 09/22/17;As usual 1 we transition him from our compression wraps to his stockings things did not go well. He has several small open areas on the right leg. He states this was caused by the compression wrap on his skin although he did not wear this with the stockings over them. He has several superficial areas on the left leg medially laterally posteriorly. He does not have any evidence of active cellulitis especially involving the left Achilles The patient is traveling from [facility] Saturday going to [facility]. He states he isn't attempting to get an appointment with a heel objects wound center there to change his dressings. I am not completely certain whether this will work 10/06/17; the patient came in on Friday for a nurse visit and the nurse reported that his legs actually look  quite good. He arrives in clinic today for his regular follow-up visit. He has a new wound on his left third toe over the PIP probably caused by friction with his footwear. He has small areas on the left leg and a very superficial but epithelialized area on the right anterior lateral lower leg. Other than that his legs look as good as I've seen him in quite some time. We have been using silver alginate Review of systems; no chest pain no shortness of breath other than this a 10 point review of systems negative 10/20/17; seen by Dr. Meyer Russel last week. He had taken some antibiotics [doxycycline] that he had left over. Dr. Meyer Russel thought he had candida infection and declined to give him further antibiotics. He has a small wound remaining on the right lateral leg several areas on the left leg including a larger area on the left posterior several left medial and anterior and a small wound on the left lateral. The area on the left dorsal third toe looks a lot better. ROS; Gen.; no fever, respiratory no cough no sputum Cardiac no chest pain other than this 10 point review of system is negative 10/30/17; patient arrives today having fallen in the bathtub 3 days ago. It took him a while to get up. He has pain and maceration in the wounds on his  left leg which have deteriorated. He has not been using his pumps he also has some maceration on the right lateral leg. 11/03/17; patient continues to have weeping edema especially in the left leg. This saturates his dressings which were just put on on 12/27. As usual the doxycycline seems to take care of the cellulitis on his lower leg. He is not complaining of fever, chills, or other systemic symptoms. He states his leg feels a lot better on the doxycycline I gave him empirically. He also apparently gets injections at his primary doctor's officeo Rocephin for cellulitis prophylaxis. I didn't ask him about his compression pump compliance today I think that's probably  marginal. Arrives in the clinic with all of his dressings primary and secondary macerated full of fluid and he has bilateral edema 11/10/17; the patient's right leg looks some better although there is still a cluster of wounds on the right lateral. The left leg is inflamed with almost circumferential skin loss medially to laterally although we are still maintaining anteriorly. He does not have overt cellulitis there is a lot of drainage. He is not using compression pumps. We have been using silver alginate to the wound areas, there are not a lot of options here 11/17/17; the patient's right leg continues to be stable although there is still open wounds, better than last week. The inflammation in the left leg is better. Still loss of surface layer epithelium especially posteriorly. There is no overt cellulitis in the amount of edema and his left leg is really quite good, tells me he is using his compression pumps once a day. 11/24/17; patient's right leg has a small superficial wound laterally this continues to improve. The inflammation in the left leg is still improving however we have continuous surface layer epithelial loss posteriorly. There is no overt cellulitis in the amount of edema in both legs is really quite good. He states he is using his compression pumps on the left leg once a day for 5 out of 7 days 12/01/17; very small superficial areas on the right lateral leg continue to improve. Edema control in both legs is better today. He has continued loss of surface epithelialization and left posterior calf although I think this is better. We have been using silver alginate with large number of absorptive secondary dressings 4 layer on the left Unna boot on the right at his request. He tells me he is using his compression pumps once a day 12/08/17; he has no open area on the right leg is edema control is good here. On the left leg however he has marked erythema and tenderness breakdown of skin. He has  what appears to be a wrap injury just distal to the popliteal fossa. 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 There was swelling erythema and breakdown in the right leg. He has much worse skin breakdown in the left leg as well multiple open areas medially and posteriorly as well as laterally. He tells me he has been using his compression pumps but tells me he feels that the drainage out of his leg is worse when he uses a compression pumps. T be fair to him he is been saying this o for a while however I  don't know that I have really been listening to this. I wonder if the compression pumps are working properly 12/22/17;. Once again he arrives with severe erythema, weeping edema from the left greater than right leg. Noncompliance with compression pumps. New this visit he is complaining of pain on the lateral aspect of the right leg and the medial aspect of his right thigh. He apparently saw his cardiologist Dr. [name] who was ordered an echocardiogram area and I think this is a step in the right direction 12/25/17; started his doxycycline Monday night. There is still intense erythema of the right leg especially in the anterior thigh although there is less tenderness. The erythema around the wound on the right lateral calf also is less tender. He still complaining of pain in the left heel. His wounds are about the same right lateral left medial left lateral. Superficial but certainly not close to closure. He denies being systemically unwell no fever chills no abdominal pain no diarrhea 12/29/17; back in follow-up of his extensive right calf and right thigh cellulitis. I added amoxicillin to cover possible doxycycline resistant  strep. This seems to of done the trick he is in much less pain there is much less erythema and swelling. He has his echocardiogram at 11:00 this morning. X-ray of the left heel was also negative. 01/05/18; the patient arrived with his edema under much better control. Now that he is retired he is able to use his compression pumps daily and sometimes twice a day per the patient. He has a wound on the right leg the lateral wound looks better. Area on the left leg also looks a lot better. He has no evidence of cellulitis in his bilateral thighs I had a quick peak at his echocardiogram. He is in normal ejection fraction and normal left ventricular function. He has moderate pulmonary hypertension moderately reduced right ventricular function. One would have to wonder about chronic sleep apnea although he says he doesn't snore. He'll review the echocardiogram with his cardiologist. 01/12/18; the patient arrives with the edema in both legs under exemplary control. He is using his compression pumps daily and sometimes twice daily. His wound on the right lateral leg is just about closed. He still has some weeping areas on the posterior left calf and lateral left calf although everything is just about closed here as well. I have spoken with Aldean Baker who is the patient's nurse practitioner and infectious disease. She was concerned that the patient had not understood that the parenteral penicillin injections he was receiving for cellulitis prophylaxis was actually benefiting him. I don't think the patient actually saw that I would tend to agree we were certainly dealing with less infections although he had a serious one last month. 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 He is healed. He'll be placed in juxtalite compression wraps and increase his lymphedema pumps to twice daily. We will follow up again next week to ensure there are no issues with the new  regiment. 01/20/18-he is here for evaluation of bilateral lower extremity weeping edema. Yesterday he was placed in compression wrap to the right lower extremity and compression stocking to left lower shrubbery. He states he uses lymphedema pumps last night and again this morning and noted a blister to the left lower extremity. On exam he was noted to have drainage to the right lower extremity. He will be placed in Unna boots bilaterally and follow-up next week 01/26/18; patient was actually discharged a  week ago to his own juxta light stockings only to return the next day with bilateral lower extremity weeping edema.he was placed in bilateral Unna boots. He arrives today with pain in the back of his left leg. There is no open area on the right leg however there is a linear/wrap injury on the left leg and weeping edema on the left leg posteriorly. I spoke with infectious disease about 10 days ago. They were disappointed that the patient elected to discontinue prophylactic intramuscular penicillin shots as they felt it was particularly beneficial in reducing the frequency of his cellulitis. I discussed this with the patient today. He does not share this view. He'll definitely need antibiotics today. Finally he is traveling to [place] and trauma leaving this Saturday and returning a week later and he does not travel with his pumps. He is going by car 01/30/18; patient was seen 4 days ago and brought back in today for review of cellulitis in the left leg posteriorly. I put him on amoxicillin this really hasn't helped as much as I might like. He is also worried because he is traveling to [facility] trauma by car. Finally we will be rewrapping him. There is no open area on the right leg over his left leg has multiple weeping areas as usual 02/09/18; The same wrap on for 10 days. He did not pick up the last doxycycline I prescribed for him. He apparently took 4 days worth he already had. There is nothing open on  his right leg and the edema control is really quite good. He's had damage in the left leg medially and laterally especially probably related to the prolonged use of Unna boots 02/12/18; the patient arrived in clinic today for a nurse visit/wrap change. He complained of a lot of pain in the left posterior calf. He is taking doxycycline that I previously prescribed for him. Unfortunately even though he used his stockings and apparently used to compression pumps twice a day he has weeping edema coming out of the lateral part of his right leg. This is coming from the lower anterior lateral skin area. 02/16/18; the patient has finished his doxycycline and will finish the amoxicillin 2 days. The area of cellulitis in the left calf posteriorly has resolved. He is no longer having any pain. He tells me he is using his compression pumps at least once a day sometimes twice. 02/23/18; the patient finished his doxycycline and Amoxil last week. On Friday he noticed a small erythematous circle about the size of a quarter on the left lower leg just above his ankle. This rapidly expanded and he now has erythema on the lateral and posterior part of the thigh. This is bright red. Also has an area on the dorsal foot just above his toes and a tender area just below the left popliteal fossa. He came off his prophylactic penicillin injections at his own insistence one or 2 months ago. This is obviously deteriorated since then 03/02/18; patient is on doxycycline and Amoxil. Culture I did last week of the weeping area on the back of his left calf grew group B strep. I have therefore renewed the amoxicillin 500 3 times a day for a further week. He has not been systemically unwell. Still complaining of an area of discomfort right under his left popliteal fossa. There is no open wound on the right leg. He tells me that he is using his pumps twice a day on most days 03/09/18; patient arrives in clinic today completing  his amoxicillin  today. The cellulitis on his left leg is better. Furthermore he tells me that he had intramuscular penicillin shots that his primary care office today. However he also states that the wrap on his right leg fell down shortly after leaving clinic last week. He developed a large blister that was present when he came in for a nurse visit later in the week and then he developed intense discomfort around this area.He tells me he is using his compression pumps 03/16/18; the patient has completed his doxycycline. The infectious part of this/cellulitis in the left heel area left popliteal area is a lot better. He has 2 open areas on the right calf. Still areas on the left calf but this is a lot better as well. 03/24/18; the patient arrives complaining of pain in the left popliteal area again. He thinks some of this is wrap injury. He has no open area on the right leg and really no open area on the left calf either except for the popliteal area. He claims to be compliant with the compression pumps 03/31/18; I gave him doxycycline last week because of cellulitis in the left popliteal area. This is a lot better although the surface epithelium is denuded off and response to this. He arrives today with uncontrolled edema in the right calf area as well as a fingernail injury in the right lateral calf. There is only a few open areas on the left 04/06/18; I gave him amoxicillin doxycycline over the last 2 weeks that the amoxicillin should be completing currently. He is not complaining of any pain or systemic symptoms. The only open areas see has is on the right lateral lower leg paradoxically I cannot see anything on the left lower leg. He tells me he is using his compression pumps twice a day on most days. Silver alginate to the wounds that are open under 4 layer compression 04/13/18; he completed antibiotics and has no new complaints. Using his compression pumps. Silver alginate that anything that's opened 04/20/18; he is  using his compression pumps religiously. Silver alginate 4 layer compression anything that's opened. He comes in today with no open wounds on the left leg but 3 on the right including a new one posteriorly. He has 2 on the right lateral and one on the right posterior. He likes Unna boots on the right leg for reasons that aren't really clear we had the usual 4 layer compression on the left. It may be necessary to move to the 4 layer compression on the right however for now I left them in the Unna boots 04/27/18; he is using his compression pumps at least once a day. He has still the wounds on the right lateral calf. The area right posteriorly has closed. He does not have an open wound on the left under 4 layer compression however on the dorsal left foot just proximal to the toes and the left third toe 2 small open areas were identified 05/11/18; he has not uses compression pumps. The areas on the right lateral calf have coalesced into one large wound necrotic surface. On the left side he has one small wound anteriorly however the edema is now weeping out of a large part of his left leg. He says he wasn't using his pumps because of the weeping fluid. I explained to him that this is the time he needs to pump more 05/18/18; patient states he is using his compression pumps twice a day. The area on the right lateral  large wound albeit superficial. On the left side he has innumerable number of small new wounds on the left calf particularly laterally but several anteriorly and medially. All these appear to have healthy granulated base these look like the remnants of blisters however they occurred under compression. The patient arrives in clinic today with his legs somewhat better. There is certainly less edema, less multiple open areas on the left calf and the right anterior leg looks somewhat better as well superficial and a little smaller. However he relates pain and erythema over the last 3-4 days in the thigh  and I looked at this today. He has not been systemically unwell no fever no chills no change in blood sugar values 05/25/18; comes in today in a better state. The severe cellulitis on his left leg seems better with the Keflex. Not as tender. He has not been systemically unwell Hard to find an open wound on the left lower leg using his compression pumps twice a day The confluent wounds on his right lateral calf somewhat better looking. These will ultimately need debridement I didn't do this today. 06/01/18; the severe cellulitis on the left anterior thigh has resolved and he is completed his Keflex. There is no open wound on the left leg however there is a superficial excoriation at the base of the third toe dorsally. Skin on the bottom of his left foot is macerated looking. The left the wounds on the lateral right leg actually looks some better although he did require debridement of the top half of this wound area with an open curet 06/09/18 on evaluation today patient appears to be doing poorly in regard to his right lower extremity in particular this appears to likely be infected he has very thick purulent discharge along with a bright green tent to the discharge. This makes me concerned about the possibility of pseudomonas. He's also having increased discomfort at this point on evaluation. Fortunately there does not appear to be any evidence of infection spreading to the other location at this time. 06/16/18 on evaluation today patient appears to actually be doing fairly well. His ulcer has actually diminished in size quite significantly at this point which is good news. Nonetheless he still does have some evidence of infection he did see infectious disease this morning before coming here for his appointment. I did review the results of their evaluation and their note today. They did actually have him discontinue the Cipro and initiate treatment with linezolid at this time. He is doing this for the  next seven days and they recommended a follow-up in four months with them. He is the keep a log of the need for intermittent antibiotic therapy between now and when he falls back up with infectious disease. This will help them gaze what exactly they need to do to try and help them out. 06/23/18; the patient arrives today with no open wounds on the left leg and left third toe healed. He is been using his compression pumps twice a day. On the right lateral leg he still has a sizable wound but this is a lot better than last time I saw this. 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 Has been using silver alginate under 4 layer compression 06/30/18; the only open wound he has is on the right lateral leg and this looks healthy. No debridement is required. We have been using silver  alginate. He does not have an open wound on the left leg. There is apparently some drainage from the dorsal proximal third toe on the left although I see no open wound here. 07/03/18 on evaluation today patient was actually here just for a nurse visit rapid change. However when he was here on Wednesday for his rat change due to having been healed on the left and then developing blisters we initiated the wrap again knowing that he would be back today for [place] to reevaluate and see were at. Unfortunately he has developed some cellulitis into the proximal portion of his right lower extremity even into the region of his thigh. 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 He was placed on one as what at that point. Nonetheless he is done with that and has been tolerating it well otherwise. Doxycycline which in the past really did not seem to be effective for him. Nonetheless I think the best option may be for [place] to definitely reinitiate the antibiotics for a longer period of time. 07/07/18; since I  last saw this patient a week ago he has had a difficult time. At that point he did not have an open wound on his left leg. We transitioned him into juxta light stockings. He was apparently in the clinic the next day with blisters on the left lateral and left medial lower calf. He also had weeping edema fluid. He was put back into a compression wrap. He was also in the clinic on Friday with intense erythema in his right thigh. Per the patient he was started on Bactrim however that didn't work at all in terms of relieving his pain and swelling. He has taken 3 doxycycline that he had left over from last time and that seems to of helped. He has blistering on the right thigh as well. 07/14/18; the erythema on his right thigh has gotten better with doxycycline that he is finishing. 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 Presumably this cellulitis in the thigh was not related to that although I think this is a bit concerning going forward. He still has an area on the right lateral calf the blister on the right medial calf just below the knee that was discussed above. On the left 2 small open areas left medial and left lateral. Edema control is adequate. He is using his compression pumps twice a day 07/20/18; continued improvement in the condition of both legs especially the edema in his bilateral thighs. He tells me he is been losing weight through a combination of diet and exercise. He is using his compression pumps twice a day. So overall she made to the remaining wounds 07/27/2018; continued improvement in condition of both legs. His edema is well controlled. The area on the right lateral leg is just about closed he had one blisters show up on the medial left upper calf. We have him in 4 layer compression. He is going on a 10-day trip to [place], [place] and [place]. He will be driving. He wants to wear Unna boots because of  the lessening amount of constriction. He will not use compression pumps while he is away 08/05/18 on evaluation today patient actually appears to be doing decently well all things considered in regard to his bilateral lower extremities. The worst ulcer is actually only posterior aspect of his left lower  extremity with a four layer compression wrap cut into his leg a couple weeks back. He did have a trip and actually had [employer] for the trip that he is worn since he was last here. Nonetheless he feels like the [employer] actually do better for him his swelling is up a little bit but he also with his trip was not taking his Lasix on a regular set schedule like he was supposed to be. He states that obviously the reason being that he cannot drive and keep going without having to urinate too frequently which makes it difficult. He did not have his pumps with him while he was away either which I think also maybe playing a role here too. 08/13/2018; the patient only has a small open wound on the right lateral calf which is a big improvement in the last month or 2. He also has the area posteriorly just below the posterior fossa on the left which I think was a wrap injury from several weeks ago. He has no current evidence of cellulitis. He tells me he is back into his compression pumps twice a day. He also tells me that while he was at the laundromat somebody stole a section of his extremitease stockings 08/20/2018; back in the clinic with a much improved state. He only has small areas on the right lateral mid calf which is just about healed. This was is more substantial area for quite a prolonged period of time. He has a small open area on the left anterior tibia. The area on the posterior calf just below the popliteal fossa is closed today. He is using his compression pumps twice a day 08/28/2018; patient has no open wound on the right leg. He has a smattering of open areas on the calf with some  weeping lymphedema. More problematically than that it looks as though his wraps of slipped down in his usual he has very angry upper area of edema just below the right medial knee and on the right lateral calf. He has no open area on his feet. The patient is traveling to [facility] next week. I will send him in an antibiotic. We will continue to wrap the right leg. We ordered extremitease stockings for him last week and I plan to transition the right leg to a stocking when he gets home which will be in 10 days time. As usual he is very reluctant to take his pumps with him when he travels 09/07/2018; patient returns from [facility]. He shows me a picture of his left leg in the mid part of his trip last week with intense fire engine erythema. The picture look bad enough I would have considered sending him to the hospital. Instead he went to the wound care center in [facility]. They did not prescribe him antibiotics but he did take some doxycycline he had leftover from a previous visit. I had given him trimethoprim sulfamethoxazole before he left this did not work according to the patient. This is resulted in some improvement fortunately. He comes back with a large wound on the left posterior calf. Smaller area on the left anterior tibia. Denuded blisters on the dorsal left foot over his toes. Does not have much in the way of wounds on the right leg although he does have a very tender area on the right posterior area just below the popliteal fossa also suggestive of infection. He promises me he is back on his pumps twice a  day 09/15/2018; the intense cellulitis in his left lower calf is a lot better. The wound area on the posterior left calf is also so better. However he has reasonably extensive wounds on the dorsal aspect of his second and third toes and the proximal foot just at the base of the toes. There is nothing open on the right leg 09/22/2018; the patient has excellent edema  control in his legs bilaterally. He is using his external compression pumps twice a day. He has no open area on the right leg and only the areas in the left foot dorsally second and third toe area on the left side. He does not have any signs of active cellulitis. 10/06/2018; the patient has good edema control bilaterally. He has no open wound on the right leg. There is a blister in the posterior aspect of his left calf that we had to deal with today. He is using his compression pumps twice a day. There is no signs of active cellulitis. We have been using silver alginate to the wound areas. He still has vulnerable areas on the base of his left first second toes dorsally He has a his extremities stockings and we are going to transition him today into the stocking on the right leg. He is cautioned that he will need to continue to use the compression pumps twice a day. If he notices uncontrolled edema in the right leg he may need to go to 3 times a day. 10/13/2018; the patient came in for a nurse check on Friday he has a large flaccid blister on the right medial calf just below the knee. We unroofed this. He has this and a new area underneath the posterior mid calf which was undoubtedly a blister as well. He also has several small areas on the right which is the area we put his extremities stocking on. 10/19/2018; the patient went to see infectious disease this morning I am not sure if that was a routine follow-up in any case the doxycycline I had given him was discontinued and started on linezolid. He has not started this. It is easy to look at his left calf and the inflammation and think this is cellulitis however he is very tender in the tissue just below the popliteal fossa and I have no doubt that there is infection going on here. He states the problem he is having is that with the compression pumps the edema goes down and then starts walking the wrap falls down. We will see if we can adhere this. He  has 1 or 2 minuscule open areas on the right still areas that are weeping on the posterior left calf, the base of his left second and third toes 10/26/18; back today in clinic with quite of skin breakdown in his left anterior leg. This may have been infection the area below the popliteal fossa seems a lot better however tremendous epithelial loss on the left anterior mid tibia area over quite inexpensive tissue. He has 2 blisters on the right side but no other open wound here. 10/29/2018; came in urgently to see [place] today and we worked him in for review. He states that the 4 layer compression on the right leg caused pain he had to cut it down to roughly his mid calf this caused swelling above the wrap and he has blisters and skin breakdown today. As a result of the pain he has not been using his pumps. Both legs are a lot more edematous and there  is a lot of weeping fluid. 11/02/18; arrives in clinic with continued difficulties in the right leg> left. Leg is swollen and painful. multiple skin blisters and new open areas especially laterally. He has not been using his pumps on the right leg. 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 He is not systemically unwell. 11/09/2018; the patient claims he is being compliant with his pumps. He is finished the doxycycline I gave him last week. Culture I did of the wound on the right lateral leg showed a few very resistant methicillin staph aureus. This was resistant to doxycycline. Nevertheless he states the pain in the leg is a lot better which makes me wonder if the cultured organism was not really what was causing the problem nevertheless this is a very dangerous organism to be culturing out of any wound. His right leg is still a lot larger than the left. He is using an Radio broadcast assistant on this area, he blames a 4-layer compression for causing the original skin breakdown which I doubt is true however I cannot talk him out  of it. We have been using silver alginate to all of these areas which were initially blisters 11/16/2018; patient is being compliant with his external compression pumps at twice a day. Miraculously he arrives in clinic today with absolutely no open wounds. He has better edema control on the left where he has been using 4 layer compression versus wound of wounds on the right and I pointed this out to him. There is no inflammation in the skin in his lower legs which is also somewhat unusual for him. There is no open wounds on the dorsal left foot. He has extremitease stockings at home and I have asked him to bring these in next week. 11/25/18 patient's lower extremity on examination today on the left appears for the most part to be wound free. He does have an open wound on the lateral aspect of the right lower extremity but this is minimal compared to what I've seen in past. He does request that we go ahead and wrap the left leg as well even though there's nothing open just so hopefully it will not reopen in short order. 1/28; patient has superficial open wounds on the right lateral calf left anterior calf and left posterior calf. His edema control is adequate. 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 We have been using silver alginate as the primary dressing. He claims compliance with his compression pumps 2/4; patient has superficial open wounds on numerous areas of his left calf and again one on the left dorsal foot. The areas on the right lateral calf have healed. The cellulitis that I gave him doxycycline for last week is also resolved this was mostly on the left anterior calf just below the tibial tuberosity. His edema looks fairly well-controlled. He tells me he went to see his primary doctor today and had blood work ordered 2/11; once again he has several open areas on the left calf left tibial area. Most of  these are small and appear to have healthy granulation. He does not have anything open on the right. The edema and control in his thighs is pretty good which is usually a good indication he has been using his pumps as requested. 2/18; he continues to have several small areas on the left calf and left tibial area. Most of  these are small healthy granulation. We put him in his stocking on the right leg last week and he arrives with a superficial open area over the right upper tibia and a fairly large area on the right lateral tibia in similar condition. His edema control actually does not look too bad, he claims to be using his compression pumps twice a day 2/25. Continued small areas on the left calf and left tibial area. New areas especially on the right are identified just below the tibial tuberosity and on the right upper tibia itself. There are also areas of weeping edema fluid even without an obvious wound. He does not have a considerable degree of lymphedema but clearly there is more edema here than his skin can handle. He states he is using the pumps twice a day. We have an Unna boot on the right and 4 layer compression on the left. 3/3; he continues to have an area on the right lateral calf and right posterior calf just below the popliteal fossa. There is a fair amount of tenderness around the wound on the popliteal fossa but I did not see any evidence of cellulitis, could just be that the wrap came down and rubbed in this area. He does not have an open area on the left leg however there is an area on the left dorsal foot at the base of the third toe We have been using silver alginate to all wound areas 3/10; he did not have an open area on his left leg last time he was here a week ago. T oday he arrives with a horizontal wound just below the tibial tuberosity and an area on the left lateral calf. He has intense erythema and tenderness in this area. The area is on the right lateral calf and  right posterior calf better than last week. We have been using silver alginate as usual 3/18 - Patient returns with 3 small open areas on left calf, and 1 small open area on right calf, the skin looks ok with no significant erythema, he continues the UNA boot on right and 4 layer compression on left. The right lateral calf wound is closed , the right posterior is small area. we will continue silver alginate to the areas. 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 He is been tolerating the dressing changes without complication. Fortunately she has made excellent improvement in regard to the overall status of his wounds. Unfortunately every time we cease wrapping him he ends up reopening in causing more significant issues at that point. Again I'm unsure of the best direction to take although I think the lymphedema clinic may be appropriate for him. 02/03/19 on evaluation today patient appears to be doing well in regard to the wounds that we saw him for last week unfortunately he has a new area on the proximal portion of his right medial/posterior lower extremity where the wrap somewhat slowed down and caused swelling and a blister to rub and open. Unfortunately this is the only opening that he has on either leg at this point. 02/17/19 on evaluation today patient's bilateral lower extremities appear to be doing well. He still completely healed in regard to the left lower extremity. In regard to the right lower extremity the area where the wrap and slid down and caused the  blister still seems to be slightly open although this is dramatically better than during the last evaluation two weeks ago. I'm very pleased with the way this stands overall. 03/03/19 on evaluation today patient appears to be doing well in regard  to his right lower extremity in general although he did have a new blister open this does not appear to be showing any evidence of active infection at this time. Fortunately there's No fevers, chills, nausea, or vomiting noted at this time. Overall I feel like he is making good progress it does feel like that the right leg will we perform the [employer] seems to do with a bit better than three layer wrap on the left which slid down on him. We may switch to doing bilateral in the book wraps. 5/4; I have not seen Mr. Hotard in quite some time. According to our case manager he did not have an open wound on his left leg last week. He had 1 remaining wound on the right posterior medial calf. He arrives today with multiple openings on the left leg probably were blisters and/or wrap injuries from Unna boots. I do not think the Unna boot's will provide adequate compression on the left. I am also not clear about the frequency he is using the compression pumps. 03/17/19 on evaluation today patient appears to be doing excellent in regard to his lower extremities compared to last week's evaluation apparently. He had gotten significantly worse last week which is unfortunate. The [employer] wrap on the left did not seem to do very well for him at all and in fact it didn't control his swelling significantly enough he had an additional outbreak. Subsequently we go back to the four layer compression wrap on the left. This is good news. At least in that he is doing better and the wound seem to be killing him. He still has not heard anything from the lymphedema clinic. 03/24/19 on evaluation today patient actually appears to be doing much better in regard to his bilateral lower Trinity as compared to last week when I saw him. Fortunately there's no signs of active infection at this time. He has been tolerating the dressing changes without complication. Overall I'm extremely pleased with the progress and appearance in  general. 04/07/19 on evaluation today patient appears to be doing well in regard to his bilateral lower extremities. His swelling is significantly down from where it was previous. With that being said he does have a couple blisters still open at this point but fortunately nothing that seems to be too severe and again the majority of the larger openings has healed at this time. 04/14/19 on evaluation today patient actually appears to be doing quite well in regard to his bilateral lower extremities in fact I'm not even sure there's anything significantly open at this time at any site. Nonetheless he did have some trouble with these wraps where they are somewhat irritating him secondary to the fact that he has noted that the graph wasn't too close down to the end of this foot in a little bit short as well up to his knee. Otherwise things seem to be doing quite well. 04/21/19 upon evaluation today patient's wound bed actually showed evidence of being completely healed in regard to both lower extremities which is excellent news. There does not appear to be any signs of active infection which is also good news. I'm very pleased in this regard. No fevers, chills, nausea, or vomiting noted at this time.  04/28/19 on evaluation today patient appears to be doing a little bit worse in regard to both lower extremities on the left mainly due to the fact that when he went infection disease the wrap was not wrapped quite high enough he developed a blister above this. On the right he is a small open area of nothing too significant but again this is continuing to give him some trouble he has been were in the Velcro compression that he has at home. 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 He's been tolerating the dressing changes without complication. Fortunately there's no signs of active infection at this time. No fevers, chills, nausea, or vomiting noted at  this time. We have been trying to get an appointment with her lymphedema clinic in [facility] but unfortunately nobody can get them on phone with not been able to even fax information over the patient likewise is not been able to get in touch with them. Overall I'm not sure exactly what's going on here with to reach out again today. 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 Still having a lot of drainage unfortunately. He tells me especially in the left but even on the right. There's no signs of active infection which is good news we've been using so ratcheted up to this point. 05/19/19 on evaluation today patient actually appears to be doing quite well with regard to his left lower extremity which is great news. Fortunately in regard to the right lower extremity has an issues with his wrap and he subsequently did remove this from what I'm understanding. Nonetheless long story short is what he had rewrapped once he removed it subsequently had maggots underneath this wrap whenever he came in for evaluation today. With that being said they were obviously completely cleaned away by the nursing staff. The visit today which is excellent news. However he does appear to potentially have some infection around the right ankle region where the maggots were located as well. He will likely require anabiotic therapy today. 05/26/19 on evaluation today patient actually appears to be doing much better in regard to his bilateral lower extremities. I feel like the infection is under much better control. With that being said there were maggots noted when the wrap was removed yet again today. Again this could have potentially been left over from previous although at this time there does not appear to be any signs of significant drainage there was obviously on the wrap some drainage as well this contracted gnats or  otherwise. Either way I do not see anything that appears to be doing worse in my pinion and in fact I think his drainage has slowed down quite significantly likely mainly due to the fact to his infection being under better control. 06/02/2019 on evaluation today patient actually appears to be doing well with regard to his bilateral lower extremities there is no signs of active infection at this time which is great news. With that being said he does have several open areas more so on the right than the left but nonetheless these are all significantly better than previously noted. 06/09/2019 on evaluation today patient actually appears to be doing well. His wrap stayed up and he did not cause any problems he had more drainage on the right compared to the left but overall I do not see any major issues  at this time which is great news. 06/16/2019 on evaluation today patient appears to be doing excellent with regard to his lower extremities the only area that is open is a new blister that can have opened as of today on the medial ankle on the left. Other than this he really seems to be doing great I see no major issues at this point. 06/23/2019 on evaluation today patient appears to be doing quite well with regard to his bilateral lower extremities. In fact he actually appears to be almost completely healed there is a small area of weeping noted of the right lower extremity just above the ankle. Nonetheless fortunately there is no signs of active infection at this time which is good news. No fevers, chills, nausea, vomiting, or diarrhea. 8/24; the patient arrived for a nurse visit today but complained of very significant pain in the left leg and therefore I was asked to look at this. Noted that he did not have an open area on the left leg last week nevertheless this was wrapped. The patient states that he is not been able to put his compression pumps on the left leg because of the discomfort. He has not been  systemically unwell 06/30/2019 on evaluation today patient unfortunately despite being excellent last week is doing much worse with regard to his left lower extremity today. In fact he had to come in for a nurse on Monday where his left leg had to be rewrapped due to excessive weeping Dr. Leanord Hawking placed him on doxycycline at that point. Fortunately there is no signs of active infection Systemically at this time which is good news. 07/07/2019 in regard to the patient's wounds today he actually seems to be doing well with his right lower extremity there really is nothing open or draining at this point this is great news. Unfortunately the left lower extremity is given him additional trouble at this time. There does not appear to be any signs of active infection nonetheless he does have a lot of edema and swelling noted at this point as well as blistering all of which has led to a much more poor appearing leg at this time compared to where it was 2 weeks ago when it was almost completely healed. Obviously this is a little discouraging for the patient. He is try to contact the lymphedema clinic in [place] he has not been able to get through to them. 07/14/2019 on evaluation today patient actually appears to be doing slightly better with regard to his left lower extremity ulcers. Overall I do feel like at least at the top of the wrap that we have been placing this area has healed quite nicely and looks much better. The remainder of the leg is showing signs of improvement. Unfortunately in the thigh area he still has an open region on the left and again on the right he has been utilizing just a Band-Aid on an area that also opened on the thigh. Again this is an area that were not able to wrap although we did do an Ace wrap to provide some compression that something that obviously is a little less effective than the compression wraps we have been using on the lower portion of the leg. He does have an  appointment with the lymphedema clinic in [facility] on Friday. 07/21/2019 on evaluation today patient appears to be doing better with regard to his lower extremity ulcers. He has been tolerating the dressing changes without complication. Fortunately there is no signs of  active infection at this time. No fevers, chills, nausea, vomiting, or diarrhea. I did receive the paperwork from the physical therapist at the lymphedema clinic in [place]. Subsequently I signed off on that this morning and sent that back to him for further progression with the treatment plan. 07/28/2019 on evaluation today patient appears to be doing very well with regard to his right lower extremity where I do not see any open wounds at this point. Fortunately he is feeling great as far as that is concerned as well. In regard to the left lower extremity he has been having issues with still several areas of weeping and edema although the upper leg is doing better his lower leg still I think is going require the compression wrap at this time. No fevers, chills, nausea, vomiting, or diarrhea. 08/04/2019 on evaluation today patient unfortunately is having new wounds on the right lower extremity. Again we have been using Unna boot wrap on that side. We switched him to using his juxta lite wrap at home. With that being said he tells me he has been using it although his legs extremely swollen and to be honest really does not appear that he has been. I cannot know that for sure however. Nonetheless he has multiple new wounds on the right lower extremity at this time. Obviously we will have to see about getting this rewrapped for him today. 08/11/2019 on evaluation today patient appears to be doing fairly well with regard to his wounds. He has been tolerating the dressing changes including the compression wraps without complication. He still has a lot of edema in his upper thigh regions bilaterally he is supposed to be seeing the  lymphedema clinic on the 15th of this month once his wraps arrive for the upper part of his legs. 08/18/2019 on evaluation today patient appears to be doing well with regard to his bilateral lower extremities at this point. He has been tolerating the dressing changes without complication. Fortunately there is no signs of active infection which is also good news. He does have a couple weeping areas on the first and second toe of the right foot he also has just a small area on the left foot upper leg and a small area on the left lower leg but overall he is doing quite well in my opinion. He is supposed to be getting his wraps shortly in fact tomorrow and then subsequently is seeing the lymphedema clinic next Wednesday on the 21st. Of note he is also leaving on the 25th to go on vacation for a week to the beach. For that reason and since there is some uncertainty about what there can be doing at lymphedema clinic next Wednesday I am get a make an appointment for next Friday here for [place] to see what we need to do for him prior to him leaving for vacation. 10/23; patient arrives in considerable pain predominantly in the upper posterior calf just distal to the popliteal fossa also in the wound anteriorly above the major wound. This is probably cellulitis and he has had this recurrently in the past. He has no open wound on the right side and he has had an Radio broadcast assistant in that area. Finally I note that he has an area on the left posterior calf which by enlarge is mostly epithelialized. This protrudes beyond the borders of the surrounding skin in the setting of dry scaly skin and lymphedema. The patient is leaving for [facility] on Sunday. Per his longstanding pattern,  he will not take his compression pumps with him predominantly out of fear that they will be stolen. He therefore asked that we put a Unna boot back on the right leg. He will also contact the wound care center in [facility] to see if they can  change his dressing in the mid week. 11/3; patient returned from his vacation to [facility]. He was seen on 1 occasion at their wound care center. They did a 2 layer compression system as they did not have our 4-layer wrap. I am not completely certain what they put on the wounds. They did not change the Unna boot on the right. The patient is also seeing a lymphedema specialist physical therapist in [place]. It appears that he has some compression sleeve for his thighs which indeed look quite a bit better than I am used to seeing. He pumps over these with his external compression pumps. 11/10; the patient has a new wound on the right medial thigh otherwise there is no open areas on the right. He has an area on the left leg posteriorly anteriorly and medially and an area over the left second toe. We have been using silver alginate. He thinks the injury on his thigh is secondary to friction from the compression sleeve he has. 11/17; the patient has a new wound on the right medial thigh last week. He thinks this is because he did not have a underlying stocking for his thigh juxta lite apparatus. He now has this. The area is fairly large and somewhat angry but I do not think he has underlying cellulitis. He has a intact blister on the right anterior tibial area. Small wound on the right great toe dorsally Small area on the medial left calf. 11/30; the patient does not have any open areas on his right leg and we did not take his juxta lite stocking off. However he states that on Friday his compression wrap fell down lodging around his upper mid calf area. As usual this creates a lot of problems for him. He called urgently today to be seen for a nurse visit however the nurse visit turned into a provider visit because of extreme erythema and pain in the left anterior tibia extending laterally and posteriorly. The area that is problematic is extensive 10/06/2019 upon evaluation today patient actually  appears to be doing poorly in regard to his left lower extremity. He Dr. Leanord Hawking did place him on doxycycline this past Monday apparently due to the fact that he was doing much worse in regard to this left leg. Fortunately the doxycycline does seem to be helping. Unfortunately we are still having a very difficult time getting his edema under any type of control in order to anticipate discharge at some point. The only way were really able to control his lymphedema really is with compression wraps and that has only even seemingly temporary. He has been seeing a lymphedema clinic they are trying to help in this regard but still this has been somewhat frustrating in general for the patient. 10/13/19 on evaluation today patient appears to be doing excellent with regard to his right lower extremity as far as the wounds are concerned. His swelling is still quite extensive unfortunately. He is still having a lot of drainage from the thigh areas bilaterally which is unfortunate. He's been going to lymphedema clinic but again he still really does not have this edema under control as far as his lower extremities are concern. With regard to his left  lower extremity this seems to be improving and I do believe the doxycycline has been of benefit for him. He is about to complete the doxycycline. 10/20/2019 on evaluation today patient appears to be doing poorly in regard to his bilateral lower extremities. More in the right thigh he has a lot of irritation at this site unfortunately. In regard to the left lower extremity the wrap was not quite as high it appears and does seem to have caused him some trouble as well. Fortunately there is no evidence of systemic infection though he does have some blue-green drainage which has me concerned for the possibility of Pseudomonas. He tells me he is previously taking Cipro without complications and he really does not care for Levaquin however due to some of the side effects he  has. He is not allergic to any medications specifically antibiotics that were aware of. 10/27/2019 on evaluation today patient actually does appear to be for the most part doing better when compared to last week's evaluation. With that being said he still has multiple open wounds over the bilateral lower extremities. He actually forgot to start taking the Cipro and states that he still has the whole bottle. He does have several new blisters on left lower extremity today I think I would recommend he go ahead and take the Cipro based on what I am seeing at this point. 12/30-Patient comes at 1 week visit, 4 layer compression wraps on the left and Unna boot on the right, primary dressing Xtrasorb and silver alginate. Patient is taking his Cipro and has a few more days left probably 5-6, and the legs are doing better. He states he is using his compressions devices which I believe he has 11/10/2019 on evaluation today patient actually appears to be much better than last time I saw him 2 weeks ago. His wounds are significantly improved and overall I am very pleased in this regard. Fortunately there is no signs of active infection at this time. He is just a couple of days away from completing Cipro. Overall his edema is much better he has been using his lymphedema pumps which I think is also helping at this point. 11/17/2019 on evaluation today patient appears to be doing excellent in regard to his wounds in general. His legs are swollen but not nearly as much as they have been in the past. Fortunately he is tolerating the compression wraps without complication. No fevers, chills, nausea, vomiting, or diarrhea. He does have some erythema however in the distal portion of his right lower extremity specifically around the forefoot and toes there is a little bit of warmth here as well. 11/24/2019 on evaluation today patient appears to be doing well with regard to his right lower extremity I really do not see any open  wounds at this point. His left lower extremity does have several open areas and his right medial thigh also is open. Other than this however overall the patient seems to be making good progress and I am very pleased at this point. 12/01/2019 on evaluation today patient appears to be doing poorly at this point in regard to his left lower extremity has several new blisters despite the fact that we have him in compression wraps. In fact he had a 4-layer compression wrap, his upper thigh wrapped from lymphedema clinic, and a juxta light over top of the 4 layer compression wrap the lymphedema clinic applied and despite all this he still develop blisters underneath. Obviously this does have me concerned  about the fact that unfortunately despite what we are doing to try to get wounds healed he continues to have new areas arise I do not think he is ever good to be at the point where he can realistically just use wraps at home to keep things under control. Typically when we heal him it takes about 1-2 days before he is back in the clinic with severe breakdown and blistering of his lower extremities bilaterally. This is happened numerous times in the past. Unfortunately I think that we may need some help as far as overall fluid overload to kind of limit what we are seeing and get things under better control. 12/08/2019 on evaluation today patient presents for follow-up concerning his ongoing bilateral lower extremity edema. Unfortunately he is still having quite a bit of swelling the compression wraps are controlling this to some degree but he did see Dr. [name] his cardiologist I do have that available for review today as far as the appointment was concerned that was on 12/06/2019. Obviously that she has been 2 days ago. The patient states that he is only been taking the Lasix 80 mg 1 time a day he had told me previously he was taking this twice a day. Nonetheless Dr. [name] recommended this be up to 80 mg 2 times a  day for the patient as he did appear to be fluid overloaded. With that being said the patient states he did this yesterday and he was unable to go anywhere or do anything due to the fact that he was constantly having to urinate. Nonetheless I think that this is still good to be something that is important for him as far as trying to get his edema under control at all things that he is going to be able to just expect his wounds to get under control and things to be better without going through at least a period of time where he is trying to stabilize his fluid management in general and I think increasing the Lasix is likely the first step here. It was also mentioned the possibility that the patient may require metolazone. With that being said he wanted to have the patient take Lasix twice a day first and then reevaluating 2 months to see where things stand. 12/15/2019 upon evaluation today patient appears to be doing regard to his legs although his toes are showing some signs of weeping especially on the left at this point to some degree on the right. There does not appear to be any signs of active infection and overall I do feel like the compression wraps are doing well for him but he has not been able to take the Lasix at home and the increased dose that Dr. [name] recommended. He tells me that just not go to be feasible for him. Nonetheless I think in this case he should probably send a message to Dr. [name] in order to discuss options from the standpoint of possible admission to get the fluid off or otherwise going forward. 12/22/2019 upon evaluation today patient appears to be doing fairly well with regard to his lower extremities at this point. In fact he would be doing excellent if it was not for the fact that his right anterior thigh apparently had an allergic reaction to adhesive tape that he used. The wound itself that we have been monitoring actually appears to be healed. There is a lot of  irritation at this point. 12/29/2019 upon evaluation today patient appears to be doing well  in regard to his lower extremities. His left medial thigh is open and somewhat draining today but this is the only region that is open the right has done much better with the treatment utilizing the steroid cream that I prescribed for him last week. Overall I am pleased in that regard. Fortunately there is no signs of active infection at this time. No fevers, chills, nausea, vomiting, or diarrhea. 01/05/2020 upon evaluation today patient appears to be doing more poorly in regard to his right lower extremity at this point upon evaluation today. Unfortunately he continues to have issues in this regard and I think the biggest issue is controlling his edema. This obviously is not very well controlled at this point is been recommended that he use the Lasix twice a day but he has not been able to do that. Unfortunately I think this is leading to an issue where honestly he is not really able to effectively control his edema and therefore the wounds really are not doing significantly better. I do not think that he is going to be able to keep things under good control unless he is able to control his edema much better. I discussed this again in great detail with him today. 01/12/2020 good news is patient actually appears to be doing quite well today at this point. He does have an appointment with lymphedema clinic tomorrow. His legs appear healed and the toe on the left is almost completely healed. In general I am very pleased with how things stand at this point. 01/19/2020 upon evaluation today patient appears to actually be doing well in regard to his lower extremities there is nothing open at this point. Fortunately he has done extremely well more recently. Has been seeing lymphedema clinic as well. With that being said he has Velcro wraps for his lower legs as well as his upper legs. The only wound really is on his toe  which is the right great toe and this is barely anything even there. With all that being said I think it is good to be appropriate today to go ahead and switch him over to the Velcro compression wraps. 01/26/2020 upon evaluation today patient appears to be doing worse with regard to his lower extremities after last week switch him to Velcro compression wraps. Unfortunately he lasted less than 24 hours he did not have the sock portion of his Velcro wrap on the left leg and subsequently developed a blister underneath the Velcro portion. Obviously this is not good and not what we were looking for at this point. He states the lymphedema clinic did tell him to wear the wrap for 23 hours and take him off for 1 I am okay with that plan but again right now we got a get things back under control again he may have some cellulitis noted as well. 02/02/2020 upon evaluation today patient unfortunately appears to have several areas of blistering on his bilateral lower extremities today mainly on the feet. His legs do seem to be doing somewhat better which is good news. Fortunately there is no evidence of active infection at this time. No fevers, chills, nausea, vomiting, or diarrhea. 02/16/2020 upon evaluation today patient appears to be doing well at this time with regard to his legs. He has a couple weeping areas on his toes but for the most part everything is doing better and does appear to be sealed up on his legs which is excellent news. We can continue with wrapping him at this point  as he had every time we discontinue the wraps he just breaks out with new wounds. There is really no point in is going forward with this at this point. 03/08/2020 upon evaluation today patient actually appears to be doing quite well with regard to his lower extremity ulcers. He has just a very superficial and really almost nonexistent blister on the left lower extremity he has in general done very well with the compression wraps. With  that being said I do not see any signs of infection at this time which is good news. 03/29/2020 upon evaluation today patient appears to be doing well with regard to his wounds currently except for where he had several new areas that opened up due to some of the wrap slipping and causing him trouble. He states he did not realize they had slipped. Nonetheless he has a 1 area on the right and 3 new areas on the left. Fortunately there is no signs of active infection at this time which is great news. 04/05/2020 upon evaluation today patient actually appears to be doing quite well in general in regard to his legs currently. Fortunately there is no signs of active infection at this time. No fevers, chills, nausea, vomiting, or diarrhea. He tells me next week that he will actually be seen in the lymphedema clinic on Thursday at 10 AM I see him on Wednesday next week. 04/12/2020 upon evaluation today patient appears to be doing very well with regard to his lower extremities bilaterally. In fact he does not appear to have any open wounds at this point which is good news. Fortunately there is no signs of active infection at this time. No fevers, chills, nausea, vomiting, or diarrhea. 04/19/2020 upon evaluation today patient appears to be doing well with regard to his wounds currently on the bilateral lower extremities. There does not appear to be any signs of active infection at this time. Fortunately there is no evidence of systemic infection and overall very pleased at this point. Nonetheless after I held him out last week he literally had blisters the next morning already which swelled up with him being right back here in the clinic. Overall I think that he is just not can be able to be discharged with his legs the way they are he is much to volume overloaded as far as fluid is concerned and that was discussed with him today of also discussed this but should try the clinic nurse manager as well as Dr.  Leanord Hawkingobson. 04/26/2020 upon evaluation today patient appears to be doing better with regard to his wounds currently. He is making some progress and overall swelling is under good control with the compression wraps. Fortunately there is no evidence of active infection at this time. 05/10/2020 on evaluation today patient appears to be doing overall well in regard to his lower extremities bilaterally. He is Tolerating the compression wraps without complication and with what we are seeing currently I feel like that he is making excellent progress. There is no signs of active infection at this time. 05/24/2020 upon evaluation today patient appears to be doing well in regard to his legs. The swelling is actually quite a bit down compared to where it has been in the past. Fortunately there is no sign of active infection at this time which is also good news. With that being said he does have several wounds on his toes that have opened up at this point. 05/31/2020 upon evaluation today patient appears to be doing well  with regard to his legs bilaterally where he really has no significant fluid buildup at this point overall he seems to be doing quite well. Very pleased in this regard. With regard to his toes these also seem to be drying up which is excellent. We have continue to wrap him as every time we tried as a transition to the juxta light wraps things just do not seem to get any better. 06/07/2020 upon evaluation today patient appears to be doing well with regard to his right leg at this point. Unfortunately left leg has a lot of blistering he tells me the wrap started to slide down on him when he tried to put his other Velcro wrap over top of it to help keep things in order but nonetheless still had some issues. 06/14/2020 on evaluation today patient appears to be doing well with regard to his lower extremity ulcers and foot ulcers at this point. I feel like everything is actually showing signs of improvement which  is great news overall there is no signs of active infection at this time. No fevers, chills, nausea, vomiting, or diarrhea. 06/21/2020 on evaluation today patient actually appears to be doing okay in regard to his wounds in general. With that being said the biggest issue I see is on his right foot in particular the first and second toe seem to be doing a little worse due to the fact this is staying very wet. I think he is probably getting need to change out his dressings a couple times in between each week when we see him in regard to his toes in order to keep this drier based on the location and how this is proceeding. 06/28/2020 on evaluation today patient appears to be doing a little bit more poorly overall in regard to the appearance of the skin I am actually somewhat concerned about the possibility of him having a little bit of an infection here. We discussed the course of potentially giving him a doxycycline prescription which he is taken previously with good result. With that being said I do believe that this is potentially mild and at this point easily fixed. I just do not want anything to get any worse. 07/12/2020 upon evaluation today patient actually appears to be making some progress with regard to his legs which is great news there does not appear to be any evidence of active infection. Overall very pleased with where things stand. 07/26/2020 upon evaluation today patient appears to be doing well with regard to his leg ulcers and toe ulcers at this point. He has been tolerating the compression wraps without complication overall very pleased in this regard. 08/09/2020 upon evaluation today patient appears to be doing well with regard to his lower extremities bilaterally. Fortunately there is no signs of active infection overall I am pleased with where things stand. 08/23/2020 on evaluation today patient appears to be doing well with regard to his wound. He has been tolerating the dressing  changes without complication. Fortunately there is no signs of active infection at this time. Overall his legs seem to be doing quite well which is great news and I am very pleased in that regard. No fevers, chills, nausea, vomiting, or diarrhea. 09/13/2020 upon evaluation today patient appears to be doing okay in regard to his lower extremities. He does have a fairly large blister on the right leg which I did remove the blister tissue from today so we can get this to dry out other than that however he  seems to be doing quite well. There is no signs of active infection at this time. 09/27/2020 upon evaluation today patient appears to actually be doing some better in regard to his right leg. Fortunately signs of active infection at this time which is great news. No fevers, chills, nausea, vomiting, or diarrhea. 10/04/2020 upon evaluation today patient actually appears to be showing signs of improvement which is great news with regard to his leg ulcers. Fortunately there is no signs of active infection which is great news he is still taking the antibiotics currently. No fevers, chills, nausea, vomiting, or diarrhea. 10/18/2020 on evaluation today patient appears to be doing well with regard to his legs currently. He has been tolerating the dressing changes including the wraps without complication. Fortunately there is no signs of active infection at this time. No fevers, chills, nausea, vomiting, or diarrhea. 10/25/2020 upon evaluation today patient appears to be doing decently well in regard to his wounds currently. He has been tolerating the dressing changes without complication. Overall I feel like he is making good progress albeit slow. Again this is something we can have to continue to wrap for some time to come most likely. 11/08/2020 upon evaluation today patient appears to be doing well with regard to his wounds currently. He has been tolerating the dressing changes without complication is not  currently on any antibiotics and he does not appear to show any signs of infection. He does continue to have a lot of drainage on the right leg not too severe but nonetheless this is very scattered. On the left leg this is looking to be much improved overall. 11/15/2020 upon evaluation today patient appears to be doing better with regard to his legs bilaterally. Especially the right leg which was much more significant last week. There does not appear to be any signs of active infection which is great news. No fevers, chills, nausea, vomiting, or diarrhea. 11/23/2019 upon evaluation today patient appears to be doing poorly still in regard to his lower extremities bilaterally. Unfortunately his right leg in particular appears to be doing much more poorly there is no signs really of infection this is not warm to touch but he does have a lot of drainage and weeping unfortunately. With that reason I do believe that we may need to initiate some treatment here to try to help calm down some of the swelling of the right leg. I think switching to a 4-layer compression wrap would be beneficial here. The patient is in agreement with giving this a try. 11/29/2020 upon evaluation today patient appears to be doing well currently in regard to his leg ulcers. I feel like the right leg is doing better he still has a lot of drainage but we do see some improvement here. The 4-layer compression wrap I think was helpful. 12/06/2020 upon evaluation today patient appears to be doing well with regard to his legs. In fact they seem to be doing about the best I have seen up to this point. Fortunately there is no signs of active infection at this time. No fevers, chills, nausea, vomiting, or diarrhea. 12/20/2020 upon evaluation today patient appears to be doing well at this time in regard to his legs. He is not having any significant draining which is great news. Fortunately there is no signs of active infection at this time. No fevers,  chills, nausea, vomiting, or diarrhea. 01/17/2021 upon evaluation today [place] actually appears to be doing excellent in regard to his legs. He has a  few areas again that come and go as far as his toes are concerned but overall this is doing quite well. 01/31/2021 upon evaluation today patient appears to be doing well with regard to his legs. Fortunately there does not appear to be any signs of active infection which is great news. Overall he is still having significant edema despite the compression wraps basically the 4-layer compression wrap to just keep things under control there is really not much room for play. 4/13: Mr. Rarick is a longstanding patient in our clinic and benefits greatly from weekly compression wraps. Today he has no complaints. He has been tolerating the wraps well. He states he is using the lymphedema pumps at home. 5/4; patient presents for follow-up of his chronic lymphedema/venous insufficiency ulcers. He comes weekly for compression wraps. He has no complaints today. He was unable to tolerate the Coflex 2 layer Last week so we will do the four press 4-layer compression. He has been using his lymphedema pumps daily. 5/18; patient presents for 2-week follow-up. He has no complaints or issues today. He has developed a new wound to the right foot on his fourth toe. He overall feels well and denies signs of infection. 6/1; patient presents for 2-week follow-up. He has no complaints or issues today. He denies signs of infection. 04/18/2021 upon evaluation today patient appears to be doing well with regard to his legs bilaterally. Family open wound is actually on the toe of his left foot everything else is completely closed which is great news. In general I am extremely pleased with where things stand at this point. The patient is also happy that things are doing so well. 05/02/2021 upon evaluation today patient's legs actually appear to be doing quite well today. Fortunately there  does not appear to be any signs of active infection which is great and overall I am extremely pleased with where he stands today. The patient does not appear to have any evidence of active infection at this time which is also great news. 05/09/2021 upon evaluation today patient appears to be doing a little bit more poorly in regard to his legs. Unfortunately he is having issues with some breakdown and a blood blister on the left leg this is due to I believe honestly to how it was wrapped last week. Fortunately there does not appear to be any signs of infection but nonetheless this is still a concern to be honest. No fevers, chills, nausea, vomiting, or diarrhea. 05/16/2021 upon evaluation today patient appears to be doing significantly better as compared to last week. I am very pleased with where things stand today. There does not appear to be any signs of infection which is great news and overall very pleased with where we stand. No fevers, chills, nausea, vomiting, or diarrhea. 05/30/2021 upon evaluation today patient appears to be doing well with regard to his legs. He has been tolerating the dressing changes without complication. Fortunately there does not appear to be any signs of active infection which is great news and overall I am extremely pleased with where things stand today. No fevers, chills, nausea, vomiting, or diarrhea. 06/20/2021 upon evaluation today patient actually appears to be making good progress today and very pleased with what we are seeing. I think his legs are really maintaining. As long as we continue wrapping he seems to be doing excellent in my opinion. Fortunately there is no signs of active infection at this time. No fevers, chills, nausea, vomiting, or diarrhea. 07/11/2021 upon evaluation  today patient actually appears to be making excellent progress at this time. Fortunately there does not appear to be any evidence of active infection which is great news and overall I am  extremely pleased with where things stand today. No fevers, chills, nausea, vomiting, or diarrhea. 07/25/2021 upon evaluation today patient appears to be doing well currently in regard to his lower extremities. He has been making good progress here and I do not see anything that is actually open significantly today this is great news. No fevers, chills, nausea, vomiting, or diarrhea. 08/08/2021 upon evaluation today patient appears to be doing well with regard to his wound. He has been tolerating the dressing changes without complication. With that being said unfortunately has a new area that opened up as far as his right posterior leg is concerned this was a blister he also has an area on the third toe right foot which also reopen. Fortunately there is no signs of active infection at this time which is great news. No fevers, chills, nausea, vomiting, or diarrhea. 10/17; patient came in today at his request initially for a nurse visit because it but out of concern for deterioration in both his lower legs and cellulitis I was asked to look at him. He comes in with increased swelling which he says started over the weekend he started to notice pain as well in his left medial ankle, right knee, left knee left dorsal foot. His wraps fell down contributing to some of this but he has not been using his compression pumps over the weekend for reasons that are not really clear. He comes in with multiple new wounds including the right posterior leg, right third toe, right fourth toe, left second toe left medial ankle left dorsal ankle and right anterior lower leg 09/19/2021 upon evaluation today patient does seem to be making improvements in general which is great news. I do not see any evidence of infection currently he does have some hypergranulation of the anterior portion of his ankle on the left side this is going require some debridement to pare this down and then subsequently silver nitrate probably due to  the amount of bleeding that he is probably going to experience. He is in agreement with this plan however. 09/26/2021 upon evaluation today although the patient's legs appear to be doing okay today unfortunately he did have maggots noted during the evaluation as well. This is again quite unfortunate with the respect to the fact that he feels like that he got the wrap wet which was noted today he is not sure when and I think this is what led to the issue. Nonetheless we can try to see what we can do about silly things off so that this does not happen again in the future although he is can have to be diligent about taking care of his wraps as well. Electronic Signature(s) Signed: 09/26/2021 12:52:59 PM By: Lenda Kelp PA-C Entered By: Lenda Kelp on 09/26/2021 12:52:59 -------------------------------------------------------------------------------- Physical Exam Details Patient Name: Date of Service: [facility], Dorance J. 09/26/2021 10:30 A M Medical Record Number: [id number] Patient Account Number: [id number] Date of Birth/Sex: Treating RN: 09/20/1951 (70 y.o. [name] Primary Care Provider: Nicoletta Ba Other Clinician: Referring Provider: Treating Provider/Extender: Adele Dan in Treatment: 296 Constitutional Well-nourished and well-hydrated in no acute distress. Respiratory normal breathing without difficulty. Psychiatric this patient is able to make decisions and demonstrates good insight into disease process. Alert and Oriented x 3. pleasant  and cooperative. Notes Upon inspection patient's wounds again in general showed signs of improvement and pleased in that regard I was able to ensure between myself and the nurses that we got all the maggots off I did not see any remaining this is good news. Electronic Signature(s) Signed: 09/26/2021 12:53:15 PM By: Lenda Kelp PA-C Entered By: Lenda Kelp on 09/26/2021  12:53:15 -------------------------------------------------------------------------------- Physician Orders Details Patient Name: Date of Service: Kevin [name], Jarrod J. 09/26/2021 10:30 A M Medical Record Number: [id number] Patient Account Number: [id number] Date of Birth/Sex: Treating RN: 07-Dec-1950 (70 y.o. [name], [name] Primary Care Provider: Nicoletta Ba Other Clinician: Referring Provider: Treating Provider/Extender: Adele Dan in Treatment: [phone] Verbal / Phone Orders: No Diagnosis Coding ICD-10 Coding Code Description L97.521 Non-pressure chronic ulcer of other 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 of skin L97.829 Non-pressure chronic ulcer of other part of left lower leg with unspecified severity L97.519 Non-pressure chronic ulcer of other part of right foot with unspecified severity L97.812 Non-pressure chronic ulcer of other part of right lower leg with fat layer exposed I87.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 Leonard Schwartz Wednesday Return A Bathing/ Shower/ Hygiene May shower with protection but do not get wound dressing(s) wet. Edema Control - Lymphedema / SCD / Other Bilateral Lower Extremities Lymphedema Pumps. Use Lymphedema pumps on leg(s) 2-3 times a day for 45-60 minutes. If wearing any wraps or hose, do not remove them. Continue exercising as instructed. Elevate legs to the level of the heart or above for 30 minutes daily and/or when sitting, a frequency of: - throughout the day Avoid standing for long periods of time. Exercise regularly Wound Treatment Wound #199 - T Third oe Wound  Laterality: Right Cleanser: Soap and Water 1 x Per Week Discharge Instructions: May shower and wash wound with dial antibacterial soap and water prior to dressing change. Cleanser: Wound Cleanser 1 x Per Week Discharge Instructions: Cleanse the wound with wound cleanser prior to applying a clean dressing using gauze sponges, not tissue or cotton balls. Peri-Wound Care: Zinc Oxide Ointment 30g tube 1 x Per Week Discharge Instructions: Apply Zinc Oxide to periwound with each dressing change Peri-Wound Care: Sween Lotion (Moisturizing lotion) 1 x Per Week Discharge Instructions: Apply moisturizing lotion as directed Prim Dressing: KerraCel Ag Gelling Fiber Dressing, 4x5 in (silver alginate) 1 x Per Week ary Discharge Instructions: Apply silver alginate to wound bed as instructed Secured With: Coban Self-Adherent Wrap 4x5 (in/yd) 1 x Per Week Discharge Instructions: Secure with Coban apply lightly as directed. Secured With: Insurance underwriter, Sterile 2x75 (in/in) 1 x Per Week Discharge Instructions: Secure with stretch gauze as directed. Wound #200 - Lower Leg Wound Laterality: Left, Medial Cleanser: Soap and Water 1 x Per Week Discharge Instructions: May shower and wash wound with dial antibacterial soap and water prior to dressing change. Cleanser: Wound Cleanser 1 x Per Week Discharge Instructions: Cleanse the wound with wound cleanser prior to applying a clean dressing using gauze sponges, not tissue or cotton balls. Peri-Wound Care: Zinc Oxide Ointment 30g tube 1 x Per Week Discharge Instructions: Apply Zinc Oxide to periwound with each dressing change Peri-Wound Care: Sween Lotion (Moisturizing lotion) 1 x  Per Week Discharge Instructions: Apply moisturizing lotion as directed Prim Dressing: KerraCel Ag Gelling Fiber Dressing, 4x5 in (silver alginate) 1 x Per Week ary Discharge Instructions: Apply silver alginate to wound bed as instructed Secondary Dressing: ABD Pad, 8x10  1 x Per Week Discharge Instructions: Apply over primary dressing as directed. Compression Wrap: FourPress (4 layer compression wrap) 1 x Per Week Discharge Instructions: ****UNNA BOOT FIRST LAYER APPLIED TO UPPER PORTION OF LOWER LEG.*** Wound #201 - Lower Leg Wound Laterality: Right, Anterior Cleanser: Soap and Water 1 x Per Week Discharge Instructions: May shower and wash wound with dial antibacterial soap and water prior to dressing change. Cleanser: Wound Cleanser 1 x Per Week Discharge Instructions: Cleanse the wound with wound cleanser prior to applying a clean dressing using gauze sponges, not tissue or cotton balls. Peri-Wound Care: Zinc Oxide Ointment 30g tube 1 x Per Week Discharge Instructions: Apply Zinc Oxide to periwound with each dressing change Peri-Wound Care: Sween Lotion (Moisturizing lotion) 1 x Per Week Discharge Instructions: Apply moisturizing lotion as directed Prim Dressing: KerraCel Ag Gelling Fiber Dressing, 4x5 in (silver alginate) 1 x Per Week ary Discharge Instructions: Apply silver alginate to wound bed as instructed Secondary Dressing: ABD Pad, 8x10 1 x Per Week Discharge Instructions: Apply over primary dressing as directed. Compression Wrap: FourPress (4 layer compression wrap) 1 x Per Week Discharge Instructions: ****UNNA BOOT FIRST LAYER APPLIED TO UPPER PORTION OF LOWER LEG.*** Wound #202 - T Fourth oe Wound Laterality: Right Cleanser: Soap and Water 1 x Per Week Discharge Instructions: May shower and wash wound with dial antibacterial soap and water prior to dressing change. Cleanser: Wound Cleanser 1 x Per Week Discharge Instructions: Cleanse the wound with wound cleanser prior to applying a clean dressing using gauze sponges, not tissue or cotton balls. Peri-Wound Care: Zinc Oxide Ointment 30g tube 1 x Per Week Discharge Instructions: Apply Zinc Oxide to periwound with each dressing change Peri-Wound Care: Sween Lotion (Moisturizing lotion) 1 x Per  Week Discharge Instructions: Apply moisturizing lotion as directed Prim Dressing: KerraCel Ag Gelling Fiber Dressing, 4x5 in (silver alginate) 1 x Per Week ary Discharge Instructions: Apply silver alginate to wound bed as instructed Secured With: Coban Self-Adherent Wrap 4x5 (in/yd) 1 x Per Week Discharge Instructions: Secure with Coban apply lightly as directed. Secured With: Insurance underwriter, Sterile 2x75 (in/in) 1 x Per Week Discharge Instructions: Secure with stretch gauze as directed. Wound #203 - Ankle Wound Laterality: Left, Anterior Cleanser: Soap and Water 1 x Per Week Discharge Instructions: May shower and wash wound with dial antibacterial soap and water prior to dressing change. Cleanser: Wound Cleanser 1 x Per Week Discharge Instructions: Cleanse the wound with wound cleanser prior to applying a clean dressing using gauze sponges, not tissue or cotton balls. Peri-Wound Care: Zinc Oxide Ointment 30g tube 1 x Per Week Discharge Instructions: Apply Zinc Oxide to periwound with each dressing change Peri-Wound Care: Sween Lotion (Moisturizing lotion) 1 x Per Week Discharge Instructions: Apply moisturizing lotion as directed Prim Dressing: KerraCel Ag Gelling Fiber Dressing, 4x5 in (silver alginate) 1 x Per Week ary Discharge Instructions: Apply silver alginate to wound bed as instructed Secondary Dressing: ABD Pad, 8x10 1 x Per Week Discharge Instructions: Apply over primary dressing as directed. Compression Wrap: FourPress (4 layer compression wrap) 1 x Per Week Discharge Instructions: ****UNNA BOOT FIRST LAYER APPLIED TO UPPER PORTION OF LOWER LEG.*** Wound #204 - T Second oe Wound Laterality: Left Cleanser: Soap and Water 1 x Per  Week Discharge Instructions: May shower and wash wound with dial antibacterial soap and water prior to dressing change. Cleanser: Wound Cleanser 1 x Per Week Discharge Instructions: Cleanse the wound with wound cleanser prior to  applying a clean dressing using gauze sponges, not tissue or cotton balls. Peri-Wound Care: Zinc Oxide Ointment 30g tube 1 x Per Week Discharge Instructions: Apply Zinc Oxide to periwound with each dressing change Peri-Wound Care: Sween Lotion (Moisturizing lotion) 1 x Per Week Discharge Instructions: Apply moisturizing lotion as directed Prim Dressing: KerraCel Ag Gelling Fiber Dressing, 4x5 in (silver alginate) 1 x Per Week ary Discharge Instructions: Apply silver alginate to wound bed as instructed Secured With: Coban Self-Adherent Wrap 4x5 (in/yd) 1 x Per Week Discharge Instructions: Secure with Coban apply lightly as directed. Secured With: Insurance underwriter, Sterile 2x75 (in/in) 1 x Per Week Discharge Instructions: Secure with stretch gauze as directed. Electronic Signature(s) Signed: 09/26/2021 5:33:12 PM By: Lenda Kelp PA-C Signed: 09/26/2021 5:48:49 PM By: Shawn Stall RN, BSN Entered By: Shawn Stall on 09/26/2021 12:23:36 -------------------------------------------------------------------------------- Problem List Details Patient Name: Date of Service: Kevin [name], Amarian J. 09/26/2021 10:30 A M Medical Record Number: [id number] Patient Account Number: [id number] Date of Birth/Sex: Treating RN: 26-Dec-1950 (70 y.o. 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 04/27/2018 No Yes skin L97.829 Non-pressure chronic ulcer of other part of left lower leg with unspecified 03/21/2021 No Yes severity L97.519 Non-pressure chronic ulcer of other part of right foot with unspecified severity 03/21/2021 No Yes L97.812 Non-pressure chronic ulcer of other part of right lower leg  with fat layer 08/08/2021 No Yes exposed I87.333 Chronic venous hypertension (idiopathic) with ulcer and inflammation of 01/22/2016 No Yes bilateral lower extremity L03.116 Cellulitis of left lower limb 08/20/2021 No Yes I89.0 Lymphedema, not elsewhere classified 01/22/2016 No Yes E11.40 Type 2 diabetes mellitus with diabetic neuropathy, unspecified 01/22/2016 No Yes E11.622 Type 2 diabetes mellitus with other skin ulcer 01/22/2016 No Yes L03.115 Cellulitis of right lower limb 12/22/2017 No Yes Inactive Problems ICD-10 Code Description Active Date Inactive Date 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 06/30/2018 06/30/2018 L97.511 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 of skin 06/30/2018 06/30/2018 Resolved Problems ICD-10 Code Description Active Date Resolved Date L97.211 Non-pressure chronic ulcer of right calf limited to breakdown of skin 06/30/2018 06/30/2018 L97.221 Non-pressure chronic ulcer of left calf limited to breakdown of skin 09/30/2016 09/30/2016 L03.116 Cellulitis of left lower limb 04/01/2017 04/01/2017 L97.211 Non-pressure chronic ulcer of right calf limited to breakdown of skin 06/30/2017 06/30/2017 Electronic Signature(s) Signed: 09/26/2021 10:52:07 AM By: Lenda Kelp PA-C Entered By: Lenda Kelp on 09/26/2021 10:52:07 -------------------------------------------------------------------------------- Progress Note Details Patient Name: Date of Service: Kevin [name], Yaakov J. 09/26/2021 10:30 A M Medical Record Number: [id number] Patient Account Number: [id number] Date of Birth/Sex: Treating RN: 1951-04-09 (70 y.o. [name] Primary Care Provider: Nicoletta Ba Other Clinician: Referring Provider: Treating Provider/Extender: Adele Dan in Treatment: 296 Subjective Chief Complaint Information obtained from Patient Patient is here for evaluation of venous/lymphedema  ulcers History of Present Illness (HPI) Referred by PCP for consultation. Patient has long standing history of BLE venous stasis, no prior ulcerations. At beginning of month, developed cellulitis  and weeping. Received IM Rocephin followed by Keflex and resolved. Wears compression stocking, appr 6 months old. Not sure strength. No present drainage. 01/22/16 this is a patient who is a type II diabetic on insulin. 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 He tells me he religiously wears pressure stockings of uncertain strength. He was here with weeping edema about 8 months ago but did not have an open wound. Roughly a month ago he had a reopening on his bilateral legs. He is been using bandages and Neosporin. He does not complain of pain. 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 Last BUN/creatinine I have is from 11/20/15 at 13 and 1.0 respectively 01/29/16; patient arrives today having tolerated the Profore wrap. He brought in his stockings and these are 18 mmHg stockings he bought from [place]. The compression here is likely inadequate. He does not complain of pain or excessive drainage she has no systemic symptoms. The wound on the right looks improved as does the one on the left although one on the left is more substantial with still tissue at risk below the actual wound area on the bilateral posterior calf 02/05/16; patient arrives with poor edema control. He states that we did put a 4 layer compression on it last week. No weight appear 5 this. 02/12/16; the area on the posterior right Has healed. The left Has a substantial wound that has necrotic surface eschar that requires a debridement with a curette. 02/16/16;the patient called or a Nurse visit secondary to increased swelling. He had been in earlier in the week with his right leg healed. He was transitioned  to is on pressure stocking on the right leg with the only open wound on the left, a substantial area on the left posterior calf. Note he has a history of severe lower extremity edema, he has a history of chronic atrial fibrillation but not heart failure per my notes but I'll need to research this. He is not complaining of chest pain shortness of breath or orthopnea. The intake nurse noted blisters on the previously closed right leg 02/19/16; this is the patient's regular visit day. I see him on Friday with escalating edema new wounds on the right leg and clear signs of at least right ventricular heart failure. I increased his Lasix to 40 twice a day. He is returning currently in follow-up. States he is noticed a decrease in that the edema 02/26/16 patient's legs have much less edema. There is nothing really open on the right leg. The left leg has improved condition of the large superficial wound on the posterior left leg 03/04/16; edema control is very much better. The patient's right leg wounds have healed. On the left leg he continues to have severe venous inflammation on the posterior aspect of the left leg. There is no tenderness and I don't think any of this is cellulitis. 03/11/16; patient's right leg is married healed and he is in his own stocking. The patient's left leg has deteriorated somewhat. There is a lot of erythema around the wound on the posterior left leg. There is also a significant rim of erythema posteriorly just above where the wrap would've ended there is a new wound in this location and a lot of tenderness. Can't rule out cellulitis in this area. 03/15/16; patient's right leg remains healed and he is in his own stocking. The patient's  left leg is much better than last review. His major wound on the posterior aspect of his left Is almost fully epithelialized. He has 3 small injuries from the wraps. Really. Erythema seems a lot better on antibiotics 03/18/16; right leg remains healed and  he is in his own stocking. The patient's left leg is much better. The area on the posterior aspect of the left calf is fully epithelialized. His 3 small injuries which were wrap injuries on the left are improved only one seems still open his erythema has resolved 03/25/16; patient's right leg remains healed and he is in his own stocking. There is no open area today on the left leg posterior leg is completely closed up. His wrap injuries at the superior aspect of his leg are also resolved. He looks as though he has some irritation on the dorsal ankle but this is fully epithelialized without evidence of infection. 03/28/16; we discharged this patient on Monday. Transitioned him into his own stocking. There were problems almost immediately with uncontrolled swelling weeping edema multiple some of which have opened. He does not feel systemically unwell in particular no chest pain no shortness of breath and he does not feel 04/08/16; the edema is under better control with the Profore light wrap but he still has pitting edema. There is one large wound anteriorly 2 on the medial aspect of his left leg and 3 small areas on the superior posterior calf. Drainage is not excessive he is tolerating a Profore light well 04/15/16; put a Profore wrap on him last week. This is controlled is edema however he had a lot of pain on his left anterior foot most of his wounds are healed 04/22/16 once again the patient has denuded areas on the left anterior foot which he states are because his wrap slips up word. He saw his primary physician today is on Lasix 40 twice a day and states that he his weight is down 20 pounds over the last 3 months. 04/29/16: Much improved. left anterior foot much improved. He is now on Lasix 80 mg per day. Much improved edema control 05/06/16; I was hoping to be able to discharge him today however once again he has blisters at a low level of where the compression was placed last week mostly on his left  lateral but also his left medial leg and a small area on the anterior part of the left foot. 05/09/16; apparently the patient went home after his appointment on 7/4 later in the evening developing pain in his upper medial thigh together with subjective fever and chills although his temperature was not taken. The pain was so intense he felt he would probably have to call 911. However he then remembered that he had leftover doxycycline from a previous round of antibiotics and took these. By the next morning he felt a lot better. He called and spoke to one of our nurses and I approved doxycycline over the phone thinking that this was in relation to the wounds we had previously seen although they were definitely were not. The patient feels a lot better old fever no chills he is still working. Blood sugars are reasonably controlled 05/13/16; patient is back in for review of his cellulitis on his anterior medial upper thigh. He is taking doxycycline this is a lot better. Culture I did of the nodular area on the dorsal aspect of his foot grew MRSA this also looks a lot better. 05/20/16; the patient is cellulitis on the medial upper  thigh has resolved. All of his wound areas including the left anterior foot, areas on the medial aspect of the left calf and the lateral aspect of the calf at all resolved. He has a new blister on the left dorsal foot at the level of the fourth toe this was excised. No evidence of infection 05/27/16; patient continues to complain weeping edema. He has new blisterlike wounds on the left anterior lateral and posterior lateral calf at the top of his wrap levels. The area on his left anterior foot appears better. He is not complaining of fever, pain or pruritus in his feet. 05/30/16; the patient's blisters on his left anterior leg posterior calf all look improved. He did not increase the Lasix 100 mg as I suggested because he was going to run out of his 40 mg tablets. He is still having  weeping edema of his toes 06/03/16; I renewed his Lasix at 80 mg once a day as he was about to run out when I last saw him. He is on 80 mg of Lasix now. I have asked him to cut down on the excessive amount of water he was drinking and asked him to drink according to his thirst mechanisms 06/12/2016 -- was seen 2 days ago and was supposed to wear his compression stockings at home but he is developed lymphedema and superficial blisters on the left lower extremity and hence came in for a review 06/24/16; the remaining wound is on his left anterior leg. He still has edema coming from between his toes. There is lymphedema here however his edema is generally better than when I last saw this. He has a history of atrial fibrillation but does not have a known history of congestive heart failure nevertheless I think he probably has this at least on a diastolic basis. 07/01/16 I reviewed his echocardiogram from January 2017. This was essentially normal. He did not have LVH, EF of 55-60%. His right ventricular function was normal although he did have trivial tricuspid and pulmonic regurgitation. This is not audible on exam however. I increased his Lasix to do massive edema in his legs well above his knees I think in early July. He was also drinking an excessive amount of water at the time. 07/15/16; missed his appointment last week because of the Labor Day holiday on Monday. He could not get another appointment later in the week. Started to feel the wrap digging in superiorly so we remove the top half and the bottom half of his wrap. He has extensive erythema and blistering superiorly in the left leg. Very tender. Very swollen. Edema in his foot with leaking edema fluid. He has not been systemically unwell 07/22/16; the area on the left leg laterally required some debridement. The medial wounds look more stable. His wrap injury wounds appear to have healed. Edema and his foot is better, weeping edema is also better.  He tells me he is meeting with the supplier of the external compression pumps at work 08/05/16; the patient was on vacation last week in [facility]. His wrap is been on for an extended period of time. Also over the weekend he developed an extensive area of tender erythema across his anterior medial thigh. He took to doxycycline yesterday that he had leftover from a previous prescription. The patient complains of weeping edema coming out of his toes 08/08/16; I saw this patient on 10/2. He was tender across his anterior thigh. I put him on doxycycline. He returns today in follow-up.  He does not have any open wounds on his lower leg, he still has edema weeping into his toes. 08/12/16; patient was seen back urgently today to follow-up for his extensive left thigh cellulitis/erysipelas. He comes back with a lot less swelling and erythema pain is much better. I believe I gave him Augmentin and Cipro. His wrap was cut down as he stated a roll down his legs. He developed blistering above the level of the wrap that remained. He has 2 open blisters and 1 intact. 08/19/16; patient is been doing his primary doctor who is increased his Lasix from 40-80 once a day or 80 already has less edema. Cellulitis has remained improved in the left thigh. 2 open areas on the posterior left calf 08/26/16; he returns today having new open blisters on the anterior part of his left leg. He has his compression pumps but is not yet been shown how to use some vital representative from the supplier. 09/02/16 patient returns today with no open wounds on the left leg. Some maceration in his plantar toes 09/10/2016 -- Dr. Leanord Hawking had recently discharged him on 09/02/2016 and he has come right back with redness swelling and some open ulcers on his left lower extremity. He says this was caused by trying to apply his compression stockings and he's been unable to use this and has not been able to use his lymphedema pumps. He had some  doxycycline leftover and he has started on this a few days ago. 09/16/16; there are no open wounds on his leg on the left and no evidence of cellulitis. 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 He does not complain of symptoms here. I am not clear use using his external compression pumps. 09/23/16; I have not seen this patient in 2 weeks. He canceled his appointment 10 days ago as he was going on vacation. He tells me that on Monday he noticed a large area on his posterior left leg which is been draining copiously and is reopened into a large wound. He is been using ABDs and the external part of his juxtalite, according to our nurse this was not on properly. 10/07/16; Still a substantial area on the posterior left leg. Using silver alginate 10/14/16; in general better although there is still open area which looks healthy. Still using silver alginate. He reminds me that this happen before he left for [name]. T oday while he was showering in the morning. He had been using his juxtalite's 10/21/16; the area on his posterior left leg is fully epithelialized. However he arrives today with a large area of tender erythema in his medial and posterior left thigh just above the knee. I have marked the area. Once again he is reluctant to consider hospitalization. I treated him with oral antibiotics in the past for a similar situation with resolution I think with doxycycline however this area it seems more extensive to me. He is not complaining of fever but does have chills and says states he is thirsty. His blood sugar today was in the 140s at home 10/25/16 the area on his posterior left leg is fully epithelialized although there is still some weeping edema. The large area of tenderness and erythema in his medial and posterior left thigh is a lot less tender although there is still a lot of swelling in this thigh. He states he feels a lot better. He  is on doxycycline and Augmentin that I started last  week. This will continued until Tuesday, December 26. I have ordered a duplex ultrasound of the left thigh rule out DVT whether there is an abscess something that would need to be drained I would also like to know. 11/01/16; he still has weeping edema from a not fully epithelialized area on his left posterior calf. Most of the rest of this looks a lot better. He has completed his antibiotics. His thigh is a lot better. Duplex ultrasound did not show a DVT in the thigh 11/08/16; he comes in today with more Denuded surface epithelium from the posterior aspect of his calf. There is no real evidence of cellulitis. The superior aspect of his wrap appears to have put quite an indentation in his leg just below the knee and this may have contributed. He does not complain of pain or fever. We have been using silver alginate as the primary dressing. The area of cellulitis in the right thigh has totally resolved. He has been using his compression stockings once a week 11/15/16; the patient arrives today with more loss of epithelium from the posterior aspect of his left calf. He now has a fairly substantial wound in this area. The reason behind this deterioration isn't exactly clear although his edema is not well controlled. He states he feels he is generally more swollen systemically. He is not complaining of chest pain shortness of breath fever. T me he has an appointment with his primary physician in early February. He is on 80 mg of oral ells Lasix a day. He claims compliance with the external compression pumps. He is not having any pain in his legs similar to what he has with his recurrent cellulitis 11/22/16; the patient arrives a follow-up of his large area on his left lateral calf. This looks somewhat better today. He came in earlier in the week for a dressing change since I saw him a week ago. He is not complaining of any pain no shortness of breath no  chest pain 11/28/16; the patient arrives for follow-up of his large area on the left lateral calf this does not look better. In fact it is larger weeping edema. The surface of the wound does not look too bad. We have been using silver alginate although I'm not certain that this is a dressing issue. 12/05/16; again the patient follows up for a large wound on the left lateral and left posterior calf this does not look better. There continues to be weeping edema necrotic surface tissue. More worrisome than this once again there is erythema below the wound involving the distal Achilles and heel suggestive of cellulitis. He is on his feet working most of the day of this is not going well. We are changing his dressing twice a week to facilitate the drainage. 12/12/16; not much change in the overall dimensions of the large area on the left posterior calf. This is very inflamed looking. I gave him an. Doxycycline last week does not really seem to have helped. He found the wrap very painful indeed it seems to of dog into his legs superiorly and perhaps around the heel. He came in early today because the drainage had soaked through his dressings. 12/19/16- patient arrives for follow-up evaluation of his left lower extremity ulcers. He states that he is using his lymphedema pumps once daily when there is "no drainage". He admits to not using his lipedema pumps while under current treatment. His blood sugars have been consistently between 150-200. 12/26/16; the patient is not using  his compression pumps at home because of the wetness on his feet. I've advised him that I think it's important for him to use this daily. He finds his feet too wet, he can put a plastic bag over his legs while he is in the pumps. Otherwise I think will be in a vicious circle. We are using silver alginate to the major area on his left posterior calf 01/02/17; the patient's posterior left leg has further of all into 3 open wounds. All of them  covered with a necrotic surface. He claims to be using his compression pumps once a day. His edema control is marginal. Continue with silver alginate 01/10/17; the patient's left posterior leg actually looks somewhat better. There is less edema, less erythema. Still has 3 open areas covered with a necrotic surface requiring debridement. He claims to be using his compression pumps once a day his edema control is better 01/17/17; the patient's left posterior calf look better last week when I saw him and his wrap was changed 2 days ago. He has noted increasing pain in the left heel and arrives today with much larger wounds extensive erythema extending down into the entire heel area especially tender medially. He is not systemically unwell CBGs have been controlled no fever. Our intake nurse showed me limegreen drainage on his AVD pads. 01/24/17; his usual this patient responds nicely to antibiotics last week giving him Levaquin for presumed Pseudomonas. The whole entire posterior part of his leg is much better much less inflamed and in the case of his Achilles heel area much less tender. He has also had some epithelialization posteriorly there are still open areas here and still draining but overall considerably better 01/31/17- He has continue to tolerate the compression wraps. he states that he continues to use the lymphedema pumps daily, and can increase to twice daily on the weekends. He is voicing no complaints or concerns regarding his LLE ulcers 02/07/17-he is here for follow-up evaluation. He states that he noted some erythema to the left medial and anterior thigh, which he states is new as of yesterday. He is concerned about recurrent cellulitis. He states his blood sugars have been slightly elevated, this morning in the 180s 02/14/17; he is here for follow-up evaluation. When he was last here there was erythema superiorly from his posterior wound in his anterior thigh. He was prescribed Levaquin  however a culture of the wound surface grew MRSA over the phone I changed him to doxycycline on Monday and things seem to be a lot better. 02/24/17; patient missed his appointment on Friday therefore we changed his nurse visit into a physician visit today. Still using silver alginate on the large area of the posterior left thigh. He isn't new area on the dorsal left second toe 03/03/17; actually better today although he admits he has not used his external compression pumps in the last 2 days or so because of work responsibilities over the weekend. 03/10/17; continued improvement. External compression pumps once a day almost all of his wounds have closed on the posterior left calf. Better edema control 03/17/17; in general improved. He still has 3 small open areas on the lateral aspect of his left leg however most of the area on the posterior part of his leg is epithelialized. He has better edema control. He has an ABD pad under his stocking on the right anterior lower leg although he did not let us look at that today. 03/24/17; patient arrives back in clinic today with  no open areas however there are areas on the posterior left calf and anterior left calf that are less than 100% epithelialized. His edema is well controlled in the left lower leg. There is some pitting edema probably lymphedema in the left upper thigh. He uses compression pumps at home once per day. I tried to get him to do this twice a day although he is very reticent. 04/01/2017 -- for the last 2 days he's had significant redness, tenderness and weeping and came in for an urgent visit today. 04/07/17; patient still has 6 more days of doxycycline. He was seen by Dr. Meyer Russel last Wednesday for cellulitis involving the posterior aspect, lateral aspect of his Involving his heel. For the most part he is better there is less erythema and less weeping. He has been on his feet for 12 hours o2 over the weekend. Using his compression pumps once a  day 04/14/17 arrives today with continued improvement. Only one area on the posterior left calf that is not fully epithelialized. He has intense bilateral venous inflammation associated with his chronic venous insufficiency disease and secondary lymphedema. We have been using silver alginate to the left posterior calf wound In passing he tells [place] today that the right leg but we have not seen in quite some time has an open area on it but he doesn't want [place] to look at this today states he will show this to [place] next week. 04/21/17; there is no open area on his left leg although he still reports some weeping edema. He showed [place] his right leg today which is the first time we've seen this leg in a long time. He has a large area of open wound on the right leg anteriorly healthy granulation. Quite a bit of swelling in the right leg and some degree of venous inflammation. He told us about the right leg in passing last week but states that deterioration in the right leg really only happened over the weekend 04/28/17; there is no open area on the left leg although there is an irritated part on the posterior which is like a wrap injury. The wound on the right leg which was new from last week at least to [place] is a lot better. 05/05/17; still no open area on the left leg. Patient is using his new compression stocking which seems to be doing a good job of controlling the edema. He states he is using his compression pumps once per day. The right leg still has an open wound although it is better in terms of surface area. Required debridement. A lot of pain in the posterior right Achilles marked tenderness. Usually this type of presentation this patient gives concern for an active cellulitis 05/12/17; patient arrives today with his major wound from last week on the right lateral leg somewhat better. Still requiring debridement. He was using his compression stocking on the left leg however that is reopened with superficial  wounds anteriorly he did not have an open wound on this leg previously. He is still using his juxta light's once daily at night. He cannot find the time to do this in the morning as he has to be at work by 7 AM 05/19/17; right lateral leg wound looks improved. No debridement required. The concerning area is on the left posterior leg which appears to almost have a subcutaneous hemorrhagic component to it. We've been using silver alginate to all the wounds 05/26/17; the right lateral leg wound continues to look improved. However the  area on the left posterior calf is a tightly adherent surface. [place] using silver alginate. Because of the weeping edema in his legs there is very little good alternatives. 06/02/17; the patient left here last week looking quite good. Major wound on the left posterior calf and a small one on the right lateral calf. Both of these look satisfactory. He tells me that by Wednesday he had noted increased pain in the left leg and drainage. He called on Thursday and Friday to get an appointment here but we were blocked. He did not go to urgent care or his primary physician. He thinks he had a fever on Thursday but did not actually take his temperature. He has not been using his compression pumps on the left leg because of pain. I advised him to go to the emergency room today for IV antibiotics for stents of left leg cellulitis but he has refused I have asked him to take 2 days off work to keep his leg elevated and he has refused this as well. In view of this I'm going to call him and Augmentin and doxycycline. 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 However after a detailed discussion regarding the risk benefits and alternatives he refuses to  get admitted to the hospital. With no other recourse I will continue him on oral antibiotics as before and hopefully he'll have his infectious disease consultation this week. 06/16/2017 -- the patient was seen today by the nurse practitioner at infectious disease Ms. Dixon. 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 He has also been advise Lotrimin cream locally. she also had wise IV antibiotics for his condition if it flares up 06/23/17; patient arrives today with drainage bilaterally although the remaining wound on the left posterior calf after cleaning up today "highlighter yellow drainage" did not look too bad. Unfortunately he has had breakdown on the right anterior leg [previously this leg had not been open and he is using a black stocking] he went to see infectious disease and is been put on clindamycin 150 mg daily, I did not verify the dose although I'm not familiar with using clindamycin in this dosing range, perhaps for prophylaxisoo 06/27/17; I brought this patient back today to follow-up on the wound deterioration on the right lower leg together with surrounding cellulitis. I started him on doxycycline 4 days ago. This area looks better however he comes in today with intense cellulitis on the medial part of his left thigh. This is not have a wound in this area. Extremely tender. We've been using silver alginate to the wounds on the right lower leg left lower leg with bilateral 4 layer compression he is using his external compression pumps once a day 07/04/17; patient's left medial thigh cellulitis looks better. He has not been using his compression pumps as his insert said it was contraindicated with cellulitis. His right leg continues to make improvements all the wounds are still open. We only have one remaining  wound on the left posterior calf. Using silver alginate to all open areas. He is on  doxycycline which I started a week ago and should be finishing I gave him Augmentin after Thursday's visit for the severe cellulitis on the left medial thigh which fortunately looks better 07/14/17; the patient's left medial thigh cellulitis has resolved. The cellulitis in his right lower calf on the right also looks better. All of his wounds are stable to improved we've been using silver alginate he has completed the antibiotics I have given him. He has clindamycin 150 mg once a day prescribed by infectious disease for prophylaxis, I've advised him to start this now. We have been using bilateral Unna boots over silver alginate to the wound areas 07/21/17; the patient is been to see infectious disease who noted his recurrent problems with cellulitis. He was not able to tolerate prophylactic clindamycin therefore he is on amoxicillin 500 twice a day. He also had a second daily dose of Lasix added By Dr. Oneta Rack but he is not taking this. Nor is he being completely compliant with his compression pumps a especially not this week. He has 2 remaining wounds one on the right posterior lateral lower leg and one on the left posterior medial lower leg. 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 The patient has Unna boots bilaterally. 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 He has bilateral Unna boots for compression and silver alginate to his wounds. Arrives today with his legs looking as good as I have seen him in quite some time. Not surprisingly his wounds look better as well with improvement on the right lateral leg venous insufficiency wound and also the left medial leg. He is still using the  compression pumps once a day 08/11/17; both legs appear to be doing better wounds on the right lateral and left medial legs look better. Skin on the right leg quite good. He is been using silver alginate as the primary dressing. I'm going to use Anasept gel calcium alginate and maintain all the secondary dressings 08/18/17; the patient continues to actually do quite well. The area on his right lateral leg is just about closed the left medial also looks better although it is still moist in this area. His edema is well controlled we have been using Anasept gel with calcium alginate and the usual secondary dressings, 4 layer compression and once daily use of his compression pumps "always been able to manage 09/01/17; the patient continues to do reasonably well in spite of his trip to [place]. The area on the right lateral leg is epithelialized. Left is much better but still open. He has more edema and more chronic erythema on the left leg [venous inflammation] 09/08/17; he arrives today with no open wound on the right lateral leg and decently controlled edema. Unfortunately his left leg is not nearly as in his good situation as last week.he apparently had increasing edema starting on Saturday. He edema soaked through into his foot so used a plastic bag to walk around his home. The area on the medial right leg which was his open area is about the same however he has lost surface epithelium on the left lateral which is new and he has significant pain in the Achilles area of the left foot. He is already on amoxicillin chronically for prophylaxis of cellulitis in the left leg 09/15/17; he  is completed a week of doxycycline and the cellulitis in the left posterior leg and Achilles area is as usual improved. He still has a lot of edema and fluid soaking through his dressings. There is no open wound on the right leg. He saw infectious disease NP today 09/22/17;As usual 1 we transition him from our compression  wraps to his stockings things did not go well. He has several small open areas on the right leg. He states this was caused by the compression wrap on his skin although he did not wear this with the stockings over them. He has several superficial areas on the left leg medially laterally posteriorly. He does not have any evidence of active cellulitis especially involving the left Achilles The patient is traveling from [facility] Saturday going to [facility]. He states he isn't attempting to get an appointment with a heel objects wound center there to change his dressings. I am not completely certain whether this will work 10/06/17; the patient came in on Friday for a nurse visit and the nurse reported that his legs actually look quite good. He arrives in clinic today for his regular follow-up visit. He has a new wound on his left third toe over the PIP probably caused by friction with his footwear. He has small areas on the left leg and a very superficial but epithelialized area on the right anterior lateral lower leg. Other than that his legs look as good as I've seen him in quite some time. We have been using silver alginate Review of systems; no chest pain no shortness of breath other than this a 10 point review of systems negative 10/20/17; seen by Dr. Meyer Russel last week. He had taken some antibiotics [doxycycline] that he had left over. Dr. Meyer Russel thought he had candida infection and declined to give him further antibiotics. He has a small wound remaining on the right lateral leg several areas on the left leg including a larger area on the left posterior several left medial and anterior and a small wound on the left lateral. The area on the left dorsal third toe looks a lot better. ROS; Gen.; no fever, respiratory no cough no sputum Cardiac no chest pain other than this 10 point review of system is negative 10/30/17; patient arrives today having fallen in the bathtub 3 days ago. It took him a while  to get up. He has pain and maceration in the wounds on his left leg which have deteriorated. He has not been using his pumps he also has some maceration on the right lateral leg. 11/03/17; patient continues to have weeping edema especially in the left leg. This saturates his dressings which were just put on on 12/27. As usual the doxycycline seems to take care of the cellulitis on his lower leg. He is not complaining of fever, chills, or other systemic symptoms. He states his leg feels a lot better on the doxycycline I gave him empirically. He also apparently gets injections at his primary doctor's officeo Rocephin for cellulitis prophylaxis. I didn't ask him about his compression pump compliance today I think that's probably marginal. Arrives in the clinic with all of his dressings primary and secondary macerated full of fluid and he has bilateral edema 11/10/17; the patient's right leg looks some better although there is still a cluster of wounds on the right lateral. The left leg is inflamed with almost circumferential skin loss medially to laterally although we are still maintaining anteriorly. He does not have overt cellulitis  there is a lot of drainage. He is not using compression pumps. We have been using silver alginate to the wound areas, there are not a lot of options here 11/17/17; the patient's right leg continues to be stable although there is still open wounds, better than last week. The inflammation in the left leg is better. Still loss of surface layer epithelium especially posteriorly. There is no overt cellulitis in the amount of edema and his left leg is really quite good, tells me he is using his compression pumps once a day. 11/24/17; patient's right leg has a small superficial wound laterally this continues to improve. The inflammation in the left leg is still improving however we have continuous surface layer epithelial loss posteriorly. There is no overt cellulitis in the amount of  edema in both legs is really quite good. He states he is using his compression pumps on the left leg once a day for 5 out of 7 days 12/01/17; very small superficial areas on the right lateral leg continue to improve. Edema control in both legs is better today. He has continued loss of surface epithelialization and left posterior calf although I think this is better. We have been using silver alginate with large number of absorptive secondary dressings 4 layer on the left Unna boot on the right at his request. He tells me he is using his compression pumps once a day 12/08/17; he has no open area on the right leg is edema control is good here. ooOn the left leg however he has marked erythema and tenderness breakdown of skin. He has what appears to be a wrap injury just distal to the popliteal fossa. 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 There was swelling erythema and breakdown in the right leg. He has much worse skin breakdown in the left leg as well multiple open areas medially and posteriorly as well as laterally. He tells me he has been using his compression pumps but tells me he feels that the drainage out of his leg is worse when he uses a compression pumps. T be fair to him he is been saying this o for a while however I don't know that I have really been listening to this. I wonder if the compression pumps are working properly 12/22/17;. Once again he arrives with severe erythema, weeping edema from the left greater than right leg. Noncompliance with compression pumps. New this visit he is complaining of pain on the lateral aspect of the right leg and the medial aspect of his right thigh. He apparently saw his  cardiologist Dr. [name] who was ordered an echocardiogram area and I think this is a step in the right direction 12/25/17; started his doxycycline Monday night. There is still intense erythema of the right leg especially in the anterior thigh although there is less tenderness. The erythema around the wound on the right lateral calf also is less tender. He still complaining of pain in the left heel. His wounds are about the same right lateral left medial left lateral. Superficial but certainly not close to closure. He denies being systemically unwell no fever chills no abdominal pain no diarrhea 12/29/17; back in follow-up of his extensive right calf and right thigh cellulitis.  I added amoxicillin to cover possible doxycycline resistant strep. This seems to of done the trick he is in much less pain there is much less erythema and swelling. He has his echocardiogram at 11:00 this morning. X-ray of the left heel was also negative. 01/05/18; the patient arrived with his edema under much better control. Now that he is retired he is able to use his compression pumps daily and sometimes twice a day per the patient. He has a wound on the right leg the lateral wound looks better. Area on the left leg also looks a lot better. He has no evidence of cellulitis in his bilateral thighs I had a quick peak at his echocardiogram. He is in normal ejection fraction and normal left ventricular function. He has moderate pulmonary hypertension moderately reduced right ventricular function. One would have to wonder about chronic sleep apnea although he says he doesn't snore. He'll review the echocardiogram with his cardiologist. 01/12/18; the patient arrives with the edema in both legs under exemplary control. He is using his compression pumps daily and sometimes twice daily. His wound on the right lateral leg is just about closed. He still has some weeping areas on the posterior left calf and lateral left calf although  everything is just about closed here as well. I have spoken with Aldean Baker who is the patient's nurse practitioner and infectious disease. She was concerned that the patient had not understood that the parenteral penicillin injections he was receiving for cellulitis prophylaxis was actually benefiting him. I don't think the patient actually saw that I would tend to agree we were certainly dealing with less infections although he had a serious one last month. 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 He is healed. He'll be placed in juxtalite compression wraps and increase his lymphedema pumps to twice daily. We will follow up again next week to ensure there are no issues with the new regiment. 01/20/18-he is here for evaluation of bilateral lower extremity weeping edema. Yesterday he was placed in compression wrap to the right lower extremity and compression stocking to left lower shrubbery. He states he uses lymphedema pumps last night and again this morning and noted a blister to the left lower extremity. On exam he was noted to have drainage to the right lower extremity. He will be placed in Unna boots bilaterally and follow-up next week 01/26/18; patient was actually discharged a week ago to his own juxta light stockings only to return the next day with bilateral lower extremity weeping edema.he was placed in bilateral Unna boots. He arrives today with pain in the back of his left leg. There is no open area on the right leg however there is a linear/wrap injury on the left leg and weeping edema on the left leg posteriorly. I spoke with infectious disease about 10 days ago. They were disappointed that the patient elected to discontinue prophylactic intramuscular penicillin shots as they felt it was particularly beneficial in reducing the frequency of his cellulitis. I discussed this with the patient today. He does not share this view. He'll definitely need  antibiotics today. Finally he is traveling to [place] and trauma leaving this Saturday and returning a week later and he does not travel with his pumps. He is going by car 01/30/18; patient was seen 4 days ago and brought back in today for review of cellulitis in the left leg posteriorly. I put him on amoxicillin this really hasn't helped as much as  I might like. He is also worried because he is traveling to [facility] trauma by car. Finally we will be rewrapping him. There is no open area on the right leg over his left leg has multiple weeping areas as usual 02/09/18; The same wrap on for 10 days. He did not pick up the last doxycycline I prescribed for him. He apparently took 4 days worth he already had. There is nothing open on his right leg and the edema control is really quite good. He's had damage in the left leg medially and laterally especially probably related to the prolonged use of Unna boots 02/12/18; the patient arrived in clinic today for a nurse visit/wrap change. He complained of a lot of pain in the left posterior calf. He is taking doxycycline that I previously prescribed for him. Unfortunately even though he used his stockings and apparently used to compression pumps twice a day he has weeping edema coming out of the lateral part of his right leg. This is coming from the lower anterior lateral skin area. 02/16/18; the patient has finished his doxycycline and will finish the amoxicillin 2 days. The area of cellulitis in the left calf posteriorly has resolved. He is no longer having any pain. He tells me he is using his compression pumps at least once a day sometimes twice. 02/23/18; the patient finished his doxycycline and Amoxil last week. On Friday he noticed a small erythematous circle about the size of a quarter on the left lower leg just above his ankle. This rapidly expanded and he now has erythema on the lateral and posterior part of the thigh. This is bright red. Also has an  area on the dorsal foot just above his toes and a tender area just below the left popliteal fossa. He came off his prophylactic penicillin injections at his own insistence one or 2 months ago. This is obviously deteriorated since then 03/02/18; patient is on doxycycline and Amoxil. Culture I did last week of the weeping area on the back of his left calf grew group B strep. I have therefore renewed the amoxicillin 500 3 times a day for a further week. He has not been systemically unwell. Still complaining of an area of discomfort right under his left popliteal fossa. There is no open wound on the right leg. He tells me that he is using his pumps twice a day on most days 03/09/18; patient arrives in clinic today completing his amoxicillin today. The cellulitis on his left leg is better. Furthermore he tells me that he had intramuscular penicillin shots that his primary care office today. However he also states that the wrap on his right leg fell down shortly after leaving clinic last week. He developed a large blister that was present when he came in for a nurse visit later in the week and then he developed intense discomfort around this area.He tells me he is using his compression pumps 03/16/18; the patient has completed his doxycycline. The infectious part of this/cellulitis in the left heel area left popliteal area is a lot better. He has 2 open areas on the right calf. Still areas on the left calf but this is a lot better as well. 03/24/18; the patient arrives complaining of pain in the left popliteal area again. He thinks some of this is wrap injury. He has no open area on the right leg and really no open area on the left calf either except for the popliteal area. He claims to be compliant with  the compression pumps 03/31/18; I gave him doxycycline last week because of cellulitis in the left popliteal area. This is a lot better although the surface epithelium is denuded off and response to this. He  arrives today with uncontrolled edema in the right calf area as well as a fingernail injury in the right lateral calf. There is only a few open areas on the left 04/06/18; I gave him amoxicillin doxycycline over the last 2 weeks that the amoxicillin should be completing currently. He is not complaining of any pain or systemic symptoms. The only open areas see has is on the right lateral lower leg paradoxically I cannot see anything on the left lower leg. He tells me he is using his compression pumps twice a day on most days. Silver alginate to the wounds that are open under 4 layer compression 04/13/18; he completed antibiotics and has no new complaints. Using his compression pumps. Silver alginate that anything that's opened 04/20/18; he is using his compression pumps religiously. Silver alginate 4 layer compression anything that's opened. He comes in today with no open wounds on the left leg but 3 on the right including a new one posteriorly. He has 2 on the right lateral and one on the right posterior. He likes Unna boots on the right leg for reasons that aren't really clear we had the usual 4 layer compression on the left. It may be necessary to move to the 4 layer compression on the right however for now I left them in the Unna boots 04/27/18; he is using his compression pumps at least once a day. He has still the wounds on the right lateral calf. The area right posteriorly has closed. He does not have an open wound on the left under 4 layer compression however on the dorsal left foot just proximal to the toes and the left third toe 2 small open areas were identified 05/11/18; he has not uses compression pumps. The areas on the right lateral calf have coalesced into one large wound necrotic surface. On the left side he has one small wound anteriorly however the edema is now weeping out of a large part of his left leg. He says he wasn't using his pumps because of the weeping fluid. I explained to him  that this is the time he needs to pump more 05/18/18; patient states he is using his compression pumps twice a day. The area on the right lateral large wound albeit superficial. On the left side he has innumerable number of small new wounds on the left calf particularly laterally but several anteriorly and medially. All these appear to have healthy granulated base these look like the remnants of blisters however they occurred under compression. The patient arrives in clinic today with his legs somewhat better. There is certainly less edema, less multiple open areas on the left calf and the right anterior leg looks somewhat better as well superficial and a little smaller. However he relates pain and erythema over the last 3-4 days in the thigh and I looked at this today. He has not been systemically unwell no fever no chills no change in blood sugar values 05/25/18; comes in today in a better state. The severe cellulitis on his left leg seems better with the Keflex. Not as tender. He has not been systemically unwell ooHard to find an open wound on the left lower leg using his compression pumps twice a day ooThe confluent wounds on his right lateral calf somewhat better looking. These  will ultimately need debridement I didn't do this today. 06/01/18; the severe cellulitis on the left anterior thigh has resolved and he is completed his Keflex. ooThere is no open wound on the left leg however there is a superficial excoriation at the base of the third toe dorsally. Skin on the bottom of his left foot is macerated looking. ooThe left the wounds on the lateral right leg actually looks some better although he did require debridement of the top half of this wound area with an open curet 06/09/18 on evaluation today patient appears to be doing poorly in regard to his right lower extremity in particular this appears to likely be infected he has very thick purulent discharge along with a bright green tent to the  discharge. This makes me concerned about the possibility of pseudomonas. He's also having increased discomfort at this point on evaluation. Fortunately there does not appear to be any evidence of infection spreading to the other location at this time. 06/16/18 on evaluation today patient appears to actually be doing fairly well. His ulcer has actually diminished in size quite significantly at this point which is good news. Nonetheless he still does have some evidence of infection he did see infectious disease this morning before coming here for his appointment. I did review the results of their evaluation and their note today. They did actually have him discontinue the Cipro and initiate treatment with linezolid at this time. He is doing this for the next seven days and they recommended a follow-up in four months with them. He is the keep a log of the need for intermittent antibiotic therapy between now and when he falls back up with infectious disease. This will help them gaze what exactly they need to do to try and help them out. 06/23/18; the patient arrives today with no open wounds on the left leg and left third toe healed. He is been using his compression pumps twice a day. On the right lateral leg he still has a sizable wound but this is a lot better than last time I saw this. 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 Has been using silver alginate under 4 layer compression 06/30/18; the only open wound he has is on the right lateral leg and this looks healthy. No debridement is required. We have been using silver alginate. He does not have an open wound on the left leg. There is apparently some drainage from the dorsal proximal third toe on the left although I see no open wound here. 07/03/18 on evaluation today patient was actually here just for a nurse visit rapid change. However when he was  here on Wednesday for his rat change due to having been healed on the left and then developing blisters we initiated the wrap again knowing that he would be back today for [place] to reevaluate and see were at. Unfortunately he has developed some cellulitis into the proximal portion of his right lower extremity even into the region of his thigh. 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 He was placed on one as what at that point. Nonetheless he is done with that and has been tolerating it well otherwise. Doxycycline which in the past really did not seem to be effective for him. Nonetheless I think the best option may be for [place] to definitely reinitiate the antibiotics  for a longer period of time. 07/07/18; since I last saw this patient a week ago he has had a difficult time. At that point he did not have an open wound on his left leg. We transitioned him into juxta light stockings. He was apparently in the clinic the next day with blisters on the left lateral and left medial lower calf. He also had weeping edema fluid. He was put back into a compression wrap. He was also in the clinic on Friday with intense erythema in his right thigh. Per the patient he was started on Bactrim however that didn't work at all in terms of relieving his pain and swelling. He has taken 3 doxycycline that he had left over from last time and that seems to of helped. He has blistering on the right thigh as well. 07/14/18; the erythema on his right thigh has gotten better with doxycycline that he is finishing. 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 Presumably this cellulitis in the thigh was not related to that although I think this is a bit concerning going forward. He still has an area on the right lateral calf the blister on the right medial calf just below the knee that was discussed above. On the left 2 small  open areas left medial and left lateral. Edema control is adequate. He is using his compression pumps twice a day 07/20/18; continued improvement in the condition of both legs especially the edema in his bilateral thighs. He tells me he is been losing weight through a combination of diet and exercise. He is using his compression pumps twice a day. So overall she made to the remaining wounds 07/27/2018; continued improvement in condition of both legs. His edema is well controlled. The area on the right lateral leg is just about closed he had one blisters show up on the medial left upper calf. We have him in 4 layer compression. He is going on a 10-day trip to [place], [place] and [place]. He will be driving. He wants to wear Unna boots because of the lessening amount of constriction. He will not use compression pumps while he is away 08/05/18 on evaluation today patient actually appears to be doing decently well all things considered in regard to his bilateral lower extremities. The worst ulcer is actually only posterior aspect of his left lower extremity with a four layer compression wrap cut into his leg a couple weeks back. He did have a trip and actually had [employer] for the trip that he is worn since he was last here. Nonetheless he feels like the [employer] actually do better for him his swelling is up a little bit but he also with his trip was not taking his Lasix on a regular set schedule like he was supposed to be. He states that obviously the reason being that he cannot drive and keep going without having to urinate too frequently which makes it difficult. He did not have his pumps with him while he was away either which I think also maybe playing a role here too. 08/13/2018; the patient only has a small open wound on the right lateral calf which is a big improvement in the last month or 2. He also has the area posteriorly just below the posterior fossa on the left which I  think was a wrap injury from several weeks ago. He has no current  evidence of cellulitis. He tells me he is back into his compression pumps twice a day. He also tells me that while he was at the laundromat somebody stole a section of his extremitease stockings 08/20/2018; back in the clinic with a much improved state. He only has small areas on the right lateral mid calf which is just about healed. This was is more substantial area for quite a prolonged period of time. He has a small open area on the left anterior tibia. The area on the posterior calf just below the popliteal fossa is closed today. He is using his compression pumps twice a day 08/28/2018; patient has no open wound on the right leg. He has a smattering of open areas on the calf with some weeping lymphedema. More problematically than that it looks as though his wraps of slipped down in his usual he has very angry upper area of edema just below the right medial knee and on the right lateral calf. He has no open area on his feet. The patient is traveling to [facility] next week. I will send him in an antibiotic. We will continue to wrap the right leg. We ordered extremitease stockings for him last week and I plan to transition the right leg to a stocking when he gets home which will be in 10 days time. As usual he is very reluctant to take his pumps with him when he travels 09/07/2018; patient returns from [facility]. He shows me a picture of his left leg in the mid part of his trip last week with intense fire engine erythema. The picture look bad enough I would have considered sending him to the hospital. Instead he went to the wound care center in [facility]. They did not prescribe him antibiotics but he did take some doxycycline he had leftover from a previous visit. I had given him trimethoprim sulfamethoxazole before he left this did not work according to the patient. This is resulted in some improvement fortunately. He  comes back with a large wound on the left posterior calf. Smaller area on the left anterior tibia. Denuded blisters on the dorsal left foot over his toes. Does not have much in the way of wounds on the right leg although he does have a very tender area on the right posterior area just below the popliteal fossa also suggestive of infection. He promises me he is back on his pumps twice a day 09/15/2018; the intense cellulitis in his left lower calf is a lot better. The wound area on the posterior left calf is also so better. However he has reasonably extensive wounds on the dorsal aspect of his second and third toes and the proximal foot just at the base of the toes. There is nothing open on the right leg 09/22/2018; the patient has excellent edema control in his legs bilaterally. He is using his external compression pumps twice a day. He has no open area on the right leg and only the areas in the left foot dorsally second and third toe area on the left side. He does not have any signs of active cellulitis. 10/06/2018; the patient has good edema control bilaterally. He has no open wound on the right leg. There is a blister in the posterior aspect of his left calf that we had to deal with today. He is using his compression pumps twice a day. There is no signs of active cellulitis. We have been using silver alginate to the wound areas. He  still has vulnerable areas on the base of his left first second toes dorsally He has a his extremities stockings and we are going to transition him today into the stocking on the right leg. He is cautioned that he will need to continue to use the compression pumps twice a day. If he notices uncontrolled edema in the right leg he may need to go to 3 times a day. 10/13/2018; the patient came in for a nurse check on Friday he has a large flaccid blister on the right medial calf just below the knee. We unroofed this. He has this and a new area underneath the posterior mid calf  which was undoubtedly a blister as well. He also has several small areas on the right which is the area we put his extremities stocking on. 10/19/2018; the patient went to see infectious disease this morning I am not sure if that was a routine follow-up in any case the doxycycline I had given him was discontinued and started on linezolid. He has not started this. It is easy to look at his left calf and the inflammation and think this is cellulitis however he is very tender in the tissue just below the popliteal fossa and I have no doubt that there is infection going on here. He states the problem he is having is that with the compression pumps the edema goes down and then starts walking the wrap falls down. We will see if we can adhere this. He has 1 or 2 minuscule open areas on the right still areas that are weeping on the posterior left calf, the base of his left second and third toes 10/26/18; back today in clinic with quite of skin breakdown in his left anterior leg. This may have been infection the area below the popliteal fossa seems a lot better however tremendous epithelial loss on the left anterior mid tibia area over quite inexpensive tissue. He has 2 blisters on the right side but no other open wound here. 10/29/2018; came in urgently to see [place] today and we worked him in for review. He states that the 4 layer compression on the right leg caused pain he had to cut it down to roughly his mid calf this caused swelling above the wrap and he has blisters and skin breakdown today. As a result of the pain he has not been using his pumps. Both legs are a lot more edematous and there is a lot of weeping fluid. 11/02/18; arrives in clinic with continued difficulties in the right leg> left. Leg is swollen and painful. multiple skin blisters and new open areas especially laterally. He has not been using his pumps on the right leg. 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 He is not systemically unwell. 11/09/2018; the patient claims he is being compliant with his pumps. He is finished the doxycycline I gave him last week. Culture I did of the wound on the right lateral leg showed a few very resistant methicillin staph aureus. This was resistant to doxycycline. Nevertheless he states the pain in the leg is a lot better which makes me wonder if the cultured organism was not really what was causing the problem nevertheless this is a very dangerous organism to be culturing out of any wound. His right leg is still a lot larger than the left. He is using an Radio broadcast assistant on this area, he blames a 4-layer compression for causing the original skin breakdown which  I doubt is true however I cannot talk him out of it. We have been using silver alginate to all of these areas which were initially blisters 11/16/2018; patient is being compliant with his external compression pumps at twice a day. Miraculously he arrives in clinic today with absolutely no open wounds. He has better edema control on the left where he has been using 4 layer compression versus wound of wounds on the right and I pointed this out to him. There is no inflammation in the skin in his lower legs which is also somewhat unusual for him. There is no open wounds on the dorsal left foot. He has extremitease stockings at home and I have asked him to bring these in next week. 11/25/18 patient's lower extremity on examination today on the left appears for the most part to be wound free. He does have an open wound on the lateral aspect of the right lower extremity but this is minimal compared to what I've seen in past. He does request that we go ahead and wrap the left leg as well even though there's nothing open just so hopefully it will not reopen in short order. 1/28; patient has superficial open wounds on the right lateral calf left anterior calf and left posterior calf. His edema control is adequate. He  has an area of very tender erythematous skin at the superior upper part of his calf compatible with his recurrent cellulitis. We have been using silver alginate as the primary dressing. He claims compliance with his compression pumps 2/4; patient has superficial open wounds on numerous areas of his left calf and again one on the left dorsal foot. The areas on the right lateral calf have healed. The cellulitis that I gave him doxycycline for last week is also resolved this was mostly on the left anterior calf just below the tibial tuberosity. His edema looks fairly well-controlled. He tells me he went to see his primary doctor today and had blood work ordered 2/11; once again he has several open areas on the left calf left tibial area. Most of these are small and appear to have healthy granulation. He does not have anything open on the right. The edema and control in his thighs is pretty good which is usually a good indication he has been using his pumps as requested. 2/18; he continues to have several small areas on the left calf and left tibial area. Most of these are small healthy granulation. We put him in his stocking on the right leg last week and he arrives with a superficial open area over the right upper tibia and a fairly large area on the right lateral tibia in similar condition. His edema control actually does not look too bad, he claims to be using his compression pumps twice a day 2/25. Continued small areas on the left calf and left tibial area. New areas especially on the right are identified just below the tibial tuberosity and on the right upper tibia itself. There are also areas of weeping edema fluid even without an obvious wound. He does not have a considerable degree of lymphedema but clearly there is more edema here than his skin can handle. He states he is using the pumps twice a day. We have an Unna boot on the right and 4 layer compression on the left. 3/3; he continues to  have an area on the right lateral calf and right posterior calf just below the popliteal fossa. There is a fair amount of  tenderness around the wound on the popliteal fossa but I did not see any evidence of cellulitis, could just be that the wrap came down and rubbed in this area. ooHe does not have an open area on the left leg however there is an area on the left dorsal foot at the base of the third toe ooWe have been using silver alginate to all wound areas 3/10; he did not have an open area on his left leg last time he was here a week ago. T oday he arrives with a horizontal wound just below the tibial tuberosity and an area on the left lateral calf. He has intense erythema and tenderness in this area. The area is on the right lateral calf and right posterior calf better than last week. We have been using silver alginate as usual 3/18 - Patient returns with 3 small open areas on left calf, and 1 small open area on right calf, the skin looks ok with no significant erythema, he continues the UNA boot on right and 4 layer compression on left. The right lateral calf wound is closed , the right posterior is small area. we will continue silver alginate to the areas. 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 He is been tolerating the dressing changes without complication. Fortunately she has made excellent improvement in regard to the overall status of his wounds. Unfortunately every time we cease wrapping him he ends up reopening in causing more significant issues at that point. Again I'm unsure of the best direction to take although I think the lymphedema clinic may be appropriate for him. 02/03/19 on evaluation today patient appears to be doing well in regard to the wounds that we saw him for last week  unfortunately he has a new area on the proximal portion of his right medial/posterior lower extremity where the wrap somewhat slowed down and caused swelling and a blister to rub and open. Unfortunately this is the only opening that he has on either leg at this point. 02/17/19 on evaluation today patient's bilateral lower extremities appear to be doing well. He still completely healed in regard to the left lower extremity. In regard to the right lower extremity the area where the wrap and slid down and caused the blister still seems to be slightly open although this is dramatically better than during the last evaluation two weeks ago. I'm very pleased with the way this stands overall. 03/03/19 on evaluation today patient appears to be doing well in regard to his right lower extremity in general although he did have a new blister open this does not appear to be showing any evidence of active infection at this time. Fortunately there's No fevers, chills, nausea, or vomiting noted at this time. Overall I feel like he is making good progress it does feel like that the right leg will we perform the [employer] seems to do with a bit better than three layer wrap on the left which slid down on him. We may switch to doing bilateral in the book wraps. 5/4; I have not seen Mr. Grandmaison in quite some time. According to our case manager he did not have an open wound on his left leg last week. He had 1 remaining wound on the right posterior medial calf. He arrives today with multiple openings  on the left leg probably were blisters and/or wrap injuries from Unna boots. I do not think the Unna boot's will provide adequate compression on the left. I am also not clear about the frequency he is using the compression pumps. 03/17/19 on evaluation today patient appears to be doing excellent in regard to his lower extremities compared to last week's evaluation apparently. He had gotten significantly worse last week which is  unfortunate. The [employer] wrap on the left did not seem to do very well for him at all and in fact it didn't control his swelling significantly enough he had an additional outbreak. Subsequently we go back to the four layer compression wrap on the left. This is good news. At least in that he is doing better and the wound seem to be killing him. He still has not heard anything from the lymphedema clinic. 03/24/19 on evaluation today patient actually appears to be doing much better in regard to his bilateral lower Trinity as compared to last week when I saw him. Fortunately there's no signs of active infection at this time. He has been tolerating the dressing changes without complication. Overall I'm extremely pleased with the progress and appearance in general. 04/07/19 on evaluation today patient appears to be doing well in regard to his bilateral lower extremities. His swelling is significantly down from where it was previous. With that being said he does have a couple blisters still open at this point but fortunately nothing that seems to be too severe and again the majority of the larger openings has healed at this time. 04/14/19 on evaluation today patient actually appears to be doing quite well in regard to his bilateral lower extremities in fact I'm not even sure there's anything significantly open at this time at any site. Nonetheless he did have some trouble with these wraps where they are somewhat irritating him secondary to the fact that he has noted that the graph wasn't too close down to the end of this foot in a little bit short as well up to his knee. Otherwise things seem to be doing quite well. 04/21/19 upon evaluation today patient's wound bed actually showed evidence of being completely healed in regard to both lower extremities which is excellent news. There does not appear to be any signs of active infection which is also good news. I'm very pleased in this regard. No fevers, chills,  nausea, or vomiting noted at this time. 04/28/19 on evaluation today patient appears to be doing a little bit worse in regard to both lower extremities on the left mainly due to the fact that when he went infection disease the wrap was not wrapped quite high enough he developed a blister above this. On the right he is a small open area of nothing too significant but again this is continuing to give him some trouble he has been were in the Velcro compression that he has at home. 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 He's been tolerating the dressing changes without complication. Fortunately there's no signs of active infection at this time. No fevers, chills, nausea, or vomiting noted at this time. We have been trying to get an appointment with her lymphedema clinic in [facility] but unfortunately nobody can get them on phone with not been able to even fax information over the patient likewise is not been able to get in touch with them. Overall I'm not sure exactly what's going  on here with to reach out again today. 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 Still having a lot of drainage unfortunately. He tells me especially in the left but even on the right. There's no signs of active infection which is good news we've been using so ratcheted up to this point. 05/19/19 on evaluation today patient actually appears to be doing quite well with regard to his left lower extremity which is great news. Fortunately in regard to the right lower extremity has an issues with his wrap and he subsequently did remove this from what I'm understanding. Nonetheless long story short is what he had rewrapped once he removed it subsequently had maggots underneath this wrap whenever he came in for evaluation today. With that being said they were obviously  completely cleaned away by the nursing staff. The visit today which is excellent news. However he does appear to potentially have some infection around the right ankle region where the maggots were located as well. He will likely require anabiotic therapy today. 05/26/19 on evaluation today patient actually appears to be doing much better in regard to his bilateral lower extremities. I feel like the infection is under much better control. With that being said there were maggots noted when the wrap was removed yet again today. Again this could have potentially been left over from previous although at this time there does not appear to be any signs of significant drainage there was obviously on the wrap some drainage as well this contracted gnats or otherwise. Either way I do not see anything that appears to be doing worse in my pinion and in fact I think his drainage has slowed down quite significantly likely mainly due to the fact to his infection being under better control. 06/02/2019 on evaluation today patient actually appears to be doing well with regard to his bilateral lower extremities there is no signs of active infection at this time which is great news. With that being said he does have several open areas more so on the right than the left but nonetheless these are all significantly better than previously noted. 06/09/2019 on evaluation today patient actually appears to be doing well. His wrap stayed up and he did not cause any problems he had more drainage on the right compared to the left but overall I do not see any major issues at this time which is great news. 06/16/2019 on evaluation today patient appears to be doing excellent with regard to his lower extremities the only area that is open is a new blister that can have opened as of today on the medial ankle on the left. Other than this he really seems to be doing great I see no major issues at this point. 06/23/2019 on evaluation today  patient appears to be doing quite well with regard to his bilateral lower extremities. In fact he actually appears to be almost completely healed there is a small area of weeping noted of the right lower extremity just above the ankle. Nonetheless fortunately there is no signs of active infection at this time which is good news. No fevers, chills, nausea, vomiting, or diarrhea. 8/24; the patient arrived for a nurse visit today but complained of very significant pain in the left leg and therefore I was asked to look at this. Noted that he did not have an open area on the left leg last week nevertheless this was wrapped. The  patient states that he is not been able to put his compression pumps on the left leg because of the discomfort. He has not been systemically unwell 06/30/2019 on evaluation today patient unfortunately despite being excellent last week is doing much worse with regard to his left lower extremity today. In fact he had to come in for a nurse on Monday where his left leg had to be rewrapped due to excessive weeping Dr. Leanord Hawking placed him on doxycycline at that point. Fortunately there is no signs of active infection Systemically at this time which is good news. 07/07/2019 in regard to the patient's wounds today he actually seems to be doing well with his right lower extremity there really is nothing open or draining at this point this is great news. Unfortunately the left lower extremity is given him additional trouble at this time. There does not appear to be any signs of active infection nonetheless he does have a lot of edema and swelling noted at this point as well as blistering all of which has led to a much more poor appearing leg at this time compared to where it was 2 weeks ago when it was almost completely healed. Obviously this is a little discouraging for the patient. He is try to contact the lymphedema clinic in [place] he has not been able to get through to them. 07/14/2019 on  evaluation today patient actually appears to be doing slightly better with regard to his left lower extremity ulcers. Overall I do feel like at least at the top of the wrap that we have been placing this area has healed quite nicely and looks much better. The remainder of the leg is showing signs of improvement. Unfortunately in the thigh area he still has an open region on the left and again on the right he has been utilizing just a Band-Aid on an area that also opened on the thigh. Again this is an area that were not able to wrap although we did do an Ace wrap to provide some compression that something that obviously is a little less effective than the compression wraps we have been using on the lower portion of the leg. He does have an appointment with the lymphedema clinic in [facility] on Friday. 07/21/2019 on evaluation today patient appears to be doing better with regard to his lower extremity ulcers. He has been tolerating the dressing changes without complication. Fortunately there is no signs of active infection at this time. No fevers, chills, nausea, vomiting, or diarrhea. I did receive the paperwork from the physical therapist at the lymphedema clinic in [place]. Subsequently I signed off on that this morning and sent that back to him for further progression with the treatment plan. 07/28/2019 on evaluation today patient appears to be doing very well with regard to his right lower extremity where I do not see any open wounds at this point. Fortunately he is feeling great as far as that is concerned as well. In regard to the left lower extremity he has been having issues with still several areas of weeping and edema although the upper leg is doing better his lower leg still I think is going require the compression wrap at this time. No fevers, chills, nausea, vomiting, or diarrhea. 08/04/2019 on evaluation today patient unfortunately is having new wounds on the right lower extremity.  Again we have been using Unna boot wrap on that side. We switched him to using his juxta lite wrap at home. With that being said he  tells me he has been using it although his legs extremely swollen and to be honest really does not appear that he has been. I cannot know that for sure however. Nonetheless he has multiple new wounds on the right lower extremity at this time. Obviously we will have to see about getting this rewrapped for him today. 08/11/2019 on evaluation today patient appears to be doing fairly well with regard to his wounds. He has been tolerating the dressing changes including the compression wraps without complication. He still has a lot of edema in his upper thigh regions bilaterally he is supposed to be seeing the lymphedema clinic on the 15th of this month once his wraps arrive for the upper part of his legs. 08/18/2019 on evaluation today patient appears to be doing well with regard to his bilateral lower extremities at this point. He has been tolerating the dressing changes without complication. Fortunately there is no signs of active infection which is also good news. He does have a couple weeping areas on the first and second toe of the right foot he also has just a small area on the left foot upper leg and a small area on the left lower leg but overall he is doing quite well in my opinion. He is supposed to be getting his wraps shortly in fact tomorrow and then subsequently is seeing the lymphedema clinic next Wednesday on the 21st. Of note he is also leaving on the 25th to go on vacation for a week to the beach. For that reason and since there is some uncertainty about what there can be doing at lymphedema clinic next Wednesday I am get a make an appointment for next Friday here for [place] to see what we need to do for him prior to him leaving for vacation. 10/23; patient arrives in considerable pain predominantly in the upper posterior calf just distal to the popliteal fossa also  in the wound anteriorly above the major wound. This is probably cellulitis and he has had this recurrently in the past. He has no open wound on the right side and he has had an Radio broadcast assistant in that area. Finally I note that he has an area on the left posterior calf which by enlarge is mostly epithelialized. This protrudes beyond the borders of the surrounding skin in the setting of dry scaly skin and lymphedema. The patient is leaving for [facility] on Sunday. Per his longstanding pattern, he will not take his compression pumps with him predominantly out of fear that they will be stolen. He therefore asked that we put a Unna boot back on the right leg. He will also contact the wound care center in [facility] to see if they can change his dressing in the mid week. 11/3; patient returned from his vacation to [facility]. He was seen on 1 occasion at their wound care center. They did a 2 layer compression system as they did not have our 4-layer wrap. I am not completely certain what they put on the wounds. They did not change the Unna boot on the right. The patient is also seeing a lymphedema specialist physical therapist in [place]. It appears that he has some compression sleeve for his thighs which indeed look quite a bit better than I am used to seeing. He pumps over these with his external compression pumps. 11/10; the patient has a new wound on the right medial thigh otherwise there is no open areas on the right. He has an area  on the left leg posteriorly anteriorly and medially and an area over the left second toe. We have been using silver alginate. He thinks the injury on his thigh is secondary to friction from the compression sleeve he has. 11/17; the patient has a new wound on the right medial thigh last week. He thinks this is because he did not have a underlying stocking for his thigh juxta lite apparatus. He now has this. The area is fairly large and somewhat angry but I do not think  he has underlying cellulitis. ooHe has a intact blister on the right anterior tibial area. ooSmall wound on the right great toe dorsally ooSmall area on the medial left calf. 11/30; the patient does not have any open areas on his right leg and we did not take his juxta lite stocking off. However he states that on Friday his compression wrap fell down lodging around his upper mid calf area. As usual this creates a lot of problems for him. He called urgently today to be seen for a nurse visit however the nurse visit turned into a provider visit because of extreme erythema and pain in the left anterior tibia extending laterally and posteriorly. The area that is problematic is extensive 10/06/2019 upon evaluation today patient actually appears to be doing poorly in regard to his left lower extremity. He Dr. Leanord Hawking did place him on doxycycline this past Monday apparently due to the fact that he was doing much worse in regard to this left leg. Fortunately the doxycycline does seem to be helping. Unfortunately we are still having a very difficult time getting his edema under any type of control in order to anticipate discharge at some point. The only way were really able to control his lymphedema really is with compression wraps and that has only even seemingly temporary. He has been seeing a lymphedema clinic they are trying to help in this regard but still this has been somewhat frustrating in general for the patient. 10/13/19 on evaluation today patient appears to be doing excellent with regard to his right lower extremity as far as the wounds are concerned. His swelling is still quite extensive unfortunately. He is still having a lot of drainage from the thigh areas bilaterally which is unfortunate. He's been going to lymphedema clinic but again he still really does not have this edema under control as far as his lower extremities are concern. With regard to his left lower extremity this seems to be  improving and I do believe the doxycycline has been of benefit for him. He is about to complete the doxycycline. 10/20/2019 on evaluation today patient appears to be doing poorly in regard to his bilateral lower extremities. More in the right thigh he has a lot of irritation at this site unfortunately. In regard to the left lower extremity the wrap was not quite as high it appears and does seem to have caused him some trouble as well. Fortunately there is no evidence of systemic infection though he does have some blue-green drainage which has me concerned for the possibility of Pseudomonas. He tells me he is previously taking Cipro without complications and he really does not care for Levaquin however due to some of the side effects he has. He is not allergic to any medications specifically antibiotics that were aware of. 10/27/2019 on evaluation today patient actually does appear to be for the most part doing better when compared to last week's evaluation. With that being said he still has multiple open  wounds over the bilateral lower extremities. He actually forgot to start taking the Cipro and states that he still has the whole bottle. He does have several new blisters on left lower extremity today I think I would recommend he go ahead and take the Cipro based on what I am seeing at this point. 12/30-Patient comes at 1 week visit, 4 layer compression wraps on the left and Unna boot on the right, primary dressing Xtrasorb and silver alginate. Patient is taking his Cipro and has a few more days left probably 5-6, and the legs are doing better. He states he is using his compressions devices which I believe he has 11/10/2019 on evaluation today patient actually appears to be much better than last time I saw him 2 weeks ago. His wounds are significantly improved and overall I am very pleased in this regard. Fortunately there is no signs of active infection at this time. He is just a couple of days away  from completing Cipro. Overall his edema is much better he has been using his lymphedema pumps which I think is also helping at this point. 11/17/2019 on evaluation today patient appears to be doing excellent in regard to his wounds in general. His legs are swollen but not nearly as much as they have been in the past. Fortunately he is tolerating the compression wraps without complication. No fevers, chills, nausea, vomiting, or diarrhea. He does have some erythema however in the distal portion of his right lower extremity specifically around the forefoot and toes there is a little bit of warmth here as well. 11/24/2019 on evaluation today patient appears to be doing well with regard to his right lower extremity I really do not see any open wounds at this point. His left lower extremity does have several open areas and his right medial thigh also is open. Other than this however overall the patient seems to be making good progress and I am very pleased at this point. 12/01/2019 on evaluation today patient appears to be doing poorly at this point in regard to his left lower extremity has several new blisters despite the fact that we have him in compression wraps. In fact he had a 4-layer compression wrap, his upper thigh wrapped from lymphedema clinic, and a juxta light over top of the 4 layer compression wrap the lymphedema clinic applied and despite all this he still develop blisters underneath. Obviously this does have me concerned about the fact that unfortunately despite what we are doing to try to get wounds healed he continues to have new areas arise I do not think he is ever good to be at the point where he can realistically just use wraps at home to keep things under control. Typically when we heal him it takes about 1-2 days before he is back in the clinic with severe breakdown and blistering of his lower extremities bilaterally. This is happened numerous times in the past. Unfortunately I  think that we may need some help as far as overall fluid overload to kind of limit what we are seeing and get things under better control. 12/08/2019 on evaluation today patient presents for follow-up concerning his ongoing bilateral lower extremity edema. Unfortunately he is still having quite a bit of swelling the compression wraps are controlling this to some degree but he did see Dr. [name] his cardiologist I do have that available for review today as far as the appointment was concerned that was on 12/06/2019. Obviously that she has been 2  days ago. The patient states that he is only been taking the Lasix 80 mg 1 time a day he had told me previously he was taking this twice a day. Nonetheless Dr. [name] recommended this be up to 80 mg 2 times a day for the patient as he did appear to be fluid overloaded. With that being said the patient states he did this yesterday and he was unable to go anywhere or do anything due to the fact that he was constantly having to urinate. Nonetheless I think that this is still good to be something that is important for him as far as trying to get his edema under control at all things that he is going to be able to just expect his wounds to get under control and things to be better without going through at least a period of time where he is trying to stabilize his fluid management in general and I think increasing the Lasix is likely the first step here. It was also mentioned the possibility that the patient may require metolazone. With that being said he wanted to have the patient take Lasix twice a day first and then reevaluating 2 months to see where things stand. 12/15/2019 upon evaluation today patient appears to be doing regard to his legs although his toes are showing some signs of weeping especially on the left at this point to some degree on the right. There does not appear to be any signs of active infection and overall I do feel like the compression wraps are doing  well for him but he has not been able to take the Lasix at home and the increased dose that Dr. [name] recommended. He tells me that just not go to be feasible for him. Nonetheless I think in this case he should probably send a message to Dr. [name] in order to discuss options from the standpoint of possible admission to get the fluid off or otherwise going forward. 12/22/2019 upon evaluation today patient appears to be doing fairly well with regard to his lower extremities at this point. In fact he would be doing excellent if it was not for the fact that his right anterior thigh apparently had an allergic reaction to adhesive tape that he used. The wound itself that we have been monitoring actually appears to be healed. There is a lot of irritation at this point. 12/29/2019 upon evaluation today patient appears to be doing well in regard to his lower extremities. His left medial thigh is open and somewhat draining today but this is the only region that is open the right has done much better with the treatment utilizing the steroid cream that I prescribed for him last week. Overall I am pleased in that regard. Fortunately there is no signs of active infection at this time. No fevers, chills, nausea, vomiting, or diarrhea. 01/05/2020 upon evaluation today patient appears to be doing more poorly in regard to his right lower extremity at this point upon evaluation today. Unfortunately he continues to have issues in this regard and I think the biggest issue is controlling his edema. This obviously is not very well controlled at this point is been recommended that he use the Lasix twice a day but he has not been able to do that. Unfortunately I think this is leading to an issue where honestly he is not really able to effectively control his edema and therefore the wounds really are not doing significantly better. I do not think that he is going  to be able to keep things under good control unless he is able to  control his edema much better. I discussed this again in great detail with him today. 01/12/2020 good news is patient actually appears to be doing quite well today at this point. He does have an appointment with lymphedema clinic tomorrow. His legs appear healed and the toe on the left is almost completely healed. In general I am very pleased with how things stand at this point. 01/19/2020 upon evaluation today patient appears to actually be doing well in regard to his lower extremities there is nothing open at this point. Fortunately he has done extremely well more recently. Has been seeing lymphedema clinic as well. With that being said he has Velcro wraps for his lower legs as well as his upper legs. The only wound really is on his toe which is the right great toe and this is barely anything even there. With all that being said I think it is good to be appropriate today to go ahead and switch him over to the Velcro compression wraps. 01/26/2020 upon evaluation today patient appears to be doing worse with regard to his lower extremities after last week switch him to Velcro compression wraps. Unfortunately he lasted less than 24 hours he did not have the sock portion of his Velcro wrap on the left leg and subsequently developed a blister underneath the Velcro portion. Obviously this is not good and not what we were looking for at this point. He states the lymphedema clinic did tell him to wear the wrap for 23 hours and take him off for 1 I am okay with that plan but again right now we got a get things back under control again he may have some cellulitis noted as well. 02/02/2020 upon evaluation today patient unfortunately appears to have several areas of blistering on his bilateral lower extremities today mainly on the feet. His legs do seem to be doing somewhat better which is good news. Fortunately there is no evidence of active infection at this time. No fevers, chills, nausea, vomiting, or  diarrhea. 02/16/2020 upon evaluation today patient appears to be doing well at this time with regard to his legs. He has a couple weeping areas on his toes but for the most part everything is doing better and does appear to be sealed up on his legs which is excellent news. We can continue with wrapping him at this point as he had every time we discontinue the wraps he just breaks out with new wounds. There is really no point in is going forward with this at this point. 03/08/2020 upon evaluation today patient actually appears to be doing quite well with regard to his lower extremity ulcers. He has just a very superficial and really almost nonexistent blister on the left lower extremity he has in general done very well with the compression wraps. With that being said I do not see any signs of infection at this time which is good news. 03/29/2020 upon evaluation today patient appears to be doing well with regard to his wounds currently except for where he had several new areas that opened up due to some of the wrap slipping and causing him trouble. He states he did not realize they had slipped. Nonetheless he has a 1 area on the right and 3 new areas on the left. Fortunately there is no signs of active infection at this time which is great news. 04/05/2020 upon evaluation today patient actually appears to  be doing quite well in general in regard to his legs currently. Fortunately there is no signs of active infection at this time. No fevers, chills, nausea, vomiting, or diarrhea. He tells me next week that he will actually be seen in the lymphedema clinic on Thursday at 10 AM I see him on Wednesday next week. 04/12/2020 upon evaluation today patient appears to be doing very well with regard to his lower extremities bilaterally. In fact he does not appear to have any open wounds at this point which is good news. Fortunately there is no signs of active infection at this time. No fevers, chills, nausea, vomiting,  or diarrhea. 04/19/2020 upon evaluation today patient appears to be doing well with regard to his wounds currently on the bilateral lower extremities. There does not appear to be any signs of active infection at this time. Fortunately there is no evidence of systemic infection and overall very pleased at this point. Nonetheless after I held him out last week he literally had blisters the next morning already which swelled up with him being right back here in the clinic. Overall I think that he is just not can be able to be discharged with his legs the way they are he is much to volume overloaded as far as fluid is concerned and that was discussed with him today of also discussed this but should try the clinic nurse manager as well as Dr. Leanord Hawking. 04/26/2020 upon evaluation today patient appears to be doing better with regard to his wounds currently. He is making some progress and overall swelling is under good control with the compression wraps. Fortunately there is no evidence of active infection at this time. 05/10/2020 on evaluation today patient appears to be doing overall well in regard to his lower extremities bilaterally. He is Tolerating the compression wraps without complication and with what we are seeing currently I feel like that he is making excellent progress. There is no signs of active infection at this time. 05/24/2020 upon evaluation today patient appears to be doing well in regard to his legs. The swelling is actually quite a bit down compared to where it has been in the past. Fortunately there is no sign of active infection at this time which is also good news. With that being said he does have several wounds on his toes that have opened up at this point. 05/31/2020 upon evaluation today patient appears to be doing well with regard to his legs bilaterally where he really has no significant fluid buildup at this point overall he seems to be doing quite well. Very pleased in this regard.  With regard to his toes these also seem to be drying up which is excellent. We have continue to wrap him as every time we tried as a transition to the juxta light wraps things just do not seem to get any better. 06/07/2020 upon evaluation today patient appears to be doing well with regard to his right leg at this point. Unfortunately left leg has a lot of blistering he tells me the wrap started to slide down on him when he tried to put his other Velcro wrap over top of it to help keep things in order but nonetheless still had some issues. 06/14/2020 on evaluation today patient appears to be doing well with regard to his lower extremity ulcers and foot ulcers at this point. I feel like everything is actually showing signs of improvement which is great news overall there is no signs of active infection  at this time. No fevers, chills, nausea, vomiting, or diarrhea. 06/21/2020 on evaluation today patient actually appears to be doing okay in regard to his wounds in general. With that being said the biggest issue I see is on his right foot in particular the first and second toe seem to be doing a little worse due to the fact this is staying very wet. I think he is probably getting need to change out his dressings a couple times in between each week when we see him in regard to his toes in order to keep this drier based on the location and how this is proceeding. 06/28/2020 on evaluation today patient appears to be doing a little bit more poorly overall in regard to the appearance of the skin I am actually somewhat concerned about the possibility of him having a little bit of an infection here. We discussed the course of potentially giving him a doxycycline prescription which he is taken previously with good result. With that being said I do believe that this is potentially mild and at this point easily fixed. I just do not want anything to get any worse. 07/12/2020 upon evaluation today patient actually appears  to be making some progress with regard to his legs which is great news there does not appear to be any evidence of active infection. Overall very pleased with where things stand. 07/26/2020 upon evaluation today patient appears to be doing well with regard to his leg ulcers and toe ulcers at this point. He has been tolerating the compression wraps without complication overall very pleased in this regard. 08/09/2020 upon evaluation today patient appears to be doing well with regard to his lower extremities bilaterally. Fortunately there is no signs of active infection overall I am pleased with where things stand. 08/23/2020 on evaluation today patient appears to be doing well with regard to his wound. He has been tolerating the dressing changes without complication. Fortunately there is no signs of active infection at this time. Overall his legs seem to be doing quite well which is great news and I am very pleased in that regard. No fevers, chills, nausea, vomiting, or diarrhea. 09/13/2020 upon evaluation today patient appears to be doing okay in regard to his lower extremities. He does have a fairly large blister on the right leg which I did remove the blister tissue from today so we can get this to dry out other than that however he seems to be doing quite well. There is no signs of active infection at this time. 09/27/2020 upon evaluation today patient appears to actually be doing some better in regard to his right leg. Fortunately signs of active infection at this time which is great news. No fevers, chills, nausea, vomiting, or diarrhea. 10/04/2020 upon evaluation today patient actually appears to be showing signs of improvement which is great news with regard to his leg ulcers. Fortunately there is no signs of active infection which is great news he is still taking the antibiotics currently. No fevers, chills, nausea, vomiting, or diarrhea. 10/18/2020 on evaluation today patient appears to be doing  well with regard to his legs currently. He has been tolerating the dressing changes including the wraps without complication. Fortunately there is no signs of active infection at this time. No fevers, chills, nausea, vomiting, or diarrhea. 10/25/2020 upon evaluation today patient appears to be doing decently well in regard to his wounds currently. He has been tolerating the dressing changes without complication. Overall I feel like he is making  good progress albeit slow. Again this is something we can have to continue to wrap for some time to come most likely. 11/08/2020 upon evaluation today patient appears to be doing well with regard to his wounds currently. He has been tolerating the dressing changes without complication is not currently on any antibiotics and he does not appear to show any signs of infection. He does continue to have a lot of drainage on the right leg not too severe but nonetheless this is very scattered. On the left leg this is looking to be much improved overall. 11/15/2020 upon evaluation today patient appears to be doing better with regard to his legs bilaterally. Especially the right leg which was much more significant last week. There does not appear to be any signs of active infection which is great news. No fevers, chills, nausea, vomiting, or diarrhea. 11/23/2019 upon evaluation today patient appears to be doing poorly still in regard to his lower extremities bilaterally. Unfortunately his right leg in particular appears to be doing much more poorly there is no signs really of infection this is not warm to touch but he does have a lot of drainage and weeping unfortunately. With that reason I do believe that we may need to initiate some treatment here to try to help calm down some of the swelling of the right leg. I think switching to a 4-layer compression wrap would be beneficial here. The patient is in agreement with giving this a try. 11/29/2020 upon evaluation today patient  appears to be doing well currently in regard to his leg ulcers. I feel like the right leg is doing better he still has a lot of drainage but we do see some improvement here. The 4-layer compression wrap I think was helpful. 12/06/2020 upon evaluation today patient appears to be doing well with regard to his legs. In fact they seem to be doing about the best I have seen up to this point. Fortunately there is no signs of active infection at this time. No fevers, chills, nausea, vomiting, or diarrhea. 12/20/2020 upon evaluation today patient appears to be doing well at this time in regard to his legs. He is not having any significant draining which is great news. Fortunately there is no signs of active infection at this time. No fevers, chills, nausea, vomiting, or diarrhea. 01/17/2021 upon evaluation today Chanan actually appears to be doing excellent in regard to his legs. He has a few areas again that come and go as far as his toes are concerned but overall this is doing quite well. 01/31/2021 upon evaluation today patient appears to be doing well with regard to his legs. Fortunately there does not appear to be any signs of active infection which is great news. Overall he is still having significant edema despite the compression wraps basically the 4-layer compression wrap to just keep things under control there is really not much room for play. 4/13: Mr. Beamer is a longstanding patient in our clinic and benefits greatly from weekly compression wraps. Today he has no complaints. He has been tolerating the wraps well. He states he is using the lymphedema pumps at home. 5/4; patient presents for follow-up of his chronic lymphedema/venous insufficiency ulcers. He comes weekly for compression wraps. He has no complaints today. He was unable to tolerate the Coflex 2 layer Last week so we will do the four press 4-layer compression. He has been using his lymphedema pumps daily. 5/18; patient presents for 2-week  follow-up. He has no complaints  or issues today. He has developed a new wound to the right foot on his fourth toe. He overall feels well and denies signs of infection. 6/1; patient presents for 2-week follow-up. He has no complaints or issues today. He denies signs of infection. 04/18/2021 upon evaluation today patient appears to be doing well with regard to his legs bilaterally. Family open wound is actually on the toe of his left foot everything else is completely closed which is great news. In general I am extremely pleased with where things stand at this point. The patient is also happy that things are doing so well. 05/02/2021 upon evaluation today patient's legs actually appear to be doing quite well today. Fortunately there does not appear to be any signs of active infection which is great and overall I am extremely pleased with where he stands today. The patient does not appear to have any evidence of active infection at this time which is also great news. 05/09/2021 upon evaluation today patient appears to be doing a little bit more poorly in regard to his legs. Unfortunately he is having issues with some breakdown and a blood blister on the left leg this is due to I believe honestly to how it was wrapped last week. Fortunately there does not appear to be any signs of infection but nonetheless this is still a concern to be honest. No fevers, chills, nausea, vomiting, or diarrhea. 05/16/2021 upon evaluation today patient appears to be doing significantly better as compared to last week. I am very pleased with where things stand today. There does not appear to be any signs of infection which is great news and overall very pleased with where we stand. No fevers, chills, nausea, vomiting, or diarrhea. 05/30/2021 upon evaluation today patient appears to be doing well with regard to his legs. He has been tolerating the dressing changes without complication. Fortunately there does not appear to be any  signs of active infection which is great news and overall I am extremely pleased with where things stand today. No fevers, chills, nausea, vomiting, or diarrhea. 06/20/2021 upon evaluation today patient actually appears to be making good progress today and very pleased with what we are seeing. I think his legs are really maintaining. As long as we continue wrapping he seems to be doing excellent in my opinion. Fortunately there is no signs of active infection at this time. No fevers, chills, nausea, vomiting, or diarrhea. 07/11/2021 upon evaluation today patient actually appears to be making excellent progress at this time. Fortunately there does not appear to be any evidence of active infection which is great news and overall I am extremely pleased with where things stand today. No fevers, chills, nausea, vomiting, or diarrhea. 07/25/2021 upon evaluation today patient appears to be doing well currently in regard to his lower extremities. He has been making good progress here and I do not see anything that is actually open significantly today this is great news. No fevers, chills, nausea, vomiting, or diarrhea. 08/08/2021 upon evaluation today patient appears to be doing well with regard to his wound. He has been tolerating the dressing changes without complication. With that being said unfortunately has a new area that opened up as far as his right posterior leg is concerned this was a blister he also has an area on the third toe right foot which also reopen. Fortunately there is no signs of active infection at this time which is great news. No fevers, chills, nausea, vomiting, or diarrhea. 10/17; patient came  in today at his request initially for a nurse visit because it but out of concern for deterioration in both his lower legs and cellulitis I was asked to look at him. He comes in with increased swelling which he says started over the weekend he started to notice pain as well in his left medial  ankle, right knee, left knee left dorsal foot. His wraps fell down contributing to some of this but he has not been using his compression pumps over the weekend for reasons that are not really clear. He comes in with multiple new wounds including the right posterior leg, right third toe, right fourth toe, left second toe left medial ankle left dorsal ankle and right anterior lower leg 09/19/2021 upon evaluation today patient does seem to be making improvements in general which is great news. I do not see any evidence of infection currently he does have some hypergranulation of the anterior portion of his ankle on the left side this is going require some debridement to pare this down and then subsequently silver nitrate probably due to the amount of bleeding that he is probably going to experience. He is in agreement with this plan however. 09/26/2021 upon evaluation today although the patient's legs appear to be doing okay today unfortunately he did have maggots noted during the evaluation as well. This is again quite unfortunate with the respect to the fact that he feels like that he got the wrap wet which was noted today he is not sure when and I think this is what led to the issue. Nonetheless we can try to see what we can do about silly things off so that this does not happen again in the future although he is can have to be diligent about taking care of his wraps as well. Objective Constitutional Well-nourished and well-hydrated in no acute distress. Vitals Time Taken: 11:17 AM, Height: 70 in, Weight: 380.2 lbs, BMI: 54.5, Temperature: 98.3 F, Pulse: 79 bpm, Respiratory Rate: 20 breaths/min, Blood Pressure: 115/68 mmHg. Respiratory normal breathing without difficulty. Psychiatric this patient is able to make decisions and demonstrates good insight into disease process. Alert and Oriented x 3. pleasant and cooperative. General Notes: Upon inspection patient's wounds again in general showed  signs of improvement and pleased in that regard I was able to ensure between myself and the nurses that we got all the maggots off I did not see any remaining this is good news. Integumentary (Hair, Skin) Wound #198 status is Healed - Epithelialized. Original cause of wound was Blister. The date acquired was: 08/08/2021. The wound has been in treatment 7 weeks. The wound is located on the Right,Posterior Lower Leg. The wound measures 0cm length x 0cm width x 0cm depth; 0cm^2 area and 0cm^3 volume. There is no tunneling or undermining noted. There is a none present amount of drainage noted. The wound margin is distinct with the outline attached to the wound base. There is no granulation within the wound bed. There is no necrotic tissue within the wound bed. Wound #199 status is Open. Original cause of wound was Gradually Appeared. The date acquired was: 08/08/2021. The wound has been in treatment 7 weeks. The wound is located on the Right T Third. The wound measures 0.5cm length x 0.6cm width x 0.1cm depth; 0.236cm^2 area and 0.024cm^3 volume. There is oe Fat Layer (Subcutaneous Tissue) exposed. There is no tunneling or undermining noted. There is a medium amount of serous drainage noted. The wound margin is distinct with the  outline attached to the wound base. There is large (67-100%) red, pink granulation within the wound bed. There is no necrotic tissue within the wound bed. Wound #200 status is Open. Original cause of wound was Gradually Appeared. The date acquired was: 08/20/2021. The wound has been in treatment 5 weeks. The wound is located on the Left,Medial Lower Leg. The wound measures 0.3cm length x 0.4cm width x 0.1cm depth; 0.094cm^2 area and 0.009cm^3 volume. There is Fat Layer (Subcutaneous Tissue) exposed. There is no tunneling or undermining noted. There is a medium amount of serosanguineous drainage noted. The wound margin is distinct with the outline attached to the wound base. There  is large (67-100%) red granulation within the wound bed. There is a small (1-33%) amount of necrotic tissue within the wound bed. Wound #201 status is Open. Original cause of wound was Gradually Appeared. The date acquired was: 08/20/2021. The wound has been in treatment 5 weeks. The wound is located on the Right,Anterior Lower Leg. The wound measures 0.8cm length x 0.5cm width x 0.1cm depth; 0.314cm^2 area and 0.031cm^3 volume. There is Fat Layer (Subcutaneous Tissue) exposed. There is no tunneling or undermining noted. There is a large amount of serous drainage noted. The wound margin is distinct with the outline attached to the wound base. There is large (67-100%) red granulation within the wound bed. There is no necrotic tissue within the wound bed. Wound #202 status is Open. Original cause of wound was Gradually Appeared. The date acquired was: 08/20/2021. The wound has been in treatment 5 weeks. The wound is located on the Right T Fourth. The wound measures 0.6cm length x 0.8cm width x 0.1cm depth; 0.377cm^2 area and 0.038cm^3 volume. There oe is Fat Layer (Subcutaneous Tissue) exposed. There is no tunneling or undermining noted. There is a medium amount of serous drainage noted. The wound margin is distinct with the outline attached to the wound base. There is large (67-100%) red granulation within the wound bed. There is a small (1-33%) amount of necrotic tissue within the wound bed including Adherent Slough. Wound #203 status is Open. Original cause of wound was Gradually Appeared. The date acquired was: 08/20/2021. The wound has been in treatment 5 weeks. The wound is located on the Left,Anterior Ankle. The wound measures 0.7cm length x 1.2cm width x 0.2cm depth; 0.66cm^2 area and 0.132cm^3 volume. There is Fat Layer (Subcutaneous Tissue) exposed. There is no tunneling or undermining noted. There is a medium amount of serosanguineous drainage noted. The wound margin is distinct with the  outline attached to the wound base. There is medium (34-66%) red, pink granulation within the wound bed. There is a medium (34-66%) amount of necrotic tissue within the wound bed including Adherent Slough. Wound #204 status is Open. Original cause of wound was Gradually Appeared. The date acquired was: 09/26/2021. The wound is located on the Left T oe Second. The wound measures 5.4cm length x 5cm width x 0.4cm depth; 21.206cm^2 area and 8.482cm^3 volume. There is Fat Layer (Subcutaneous Tissue) exposed. There is no tunneling or undermining noted. There is a medium amount of serosanguineous drainage noted. The wound margin is flat and intact. There is large (67-100%) red granulation within the wound bed. There is a small (1-33%) amount of necrotic tissue within the wound bed. Wound #205 status is Open. Original cause of wound was Gradually Appeared. The date acquired was: 09/26/2021. The wound is located on the Left,Posterior Lower Leg. The wound measures 3.2cm length x 0.9cm width x 0.1cm depth;  2.262cm^2 area and 0.226cm^3 volume. There is Fat Layer (Subcutaneous Tissue) exposed. There is no tunneling or undermining noted. There is a medium amount of serosanguineous drainage noted. The wound margin is flat and intact. There is large (67-100%) pink granulation within the wound bed. There is no necrotic tissue within the wound bed. 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 There was a Four Layer Compression Therapy Procedure by Shawn Stall, RN. Post procedure Diagnosis Wound #200: Same as Pre-Procedure Wound #201 Pre-procedure diagnosis of Wound #201 is a Lymphedema located on the Right,Anterior Lower Leg . There was a Four Layer Compression Therapy Procedure by Shawn Stall, RN. Post procedure Diagnosis Wound #201: Same as Pre-Procedure Wound #203 Pre-procedure diagnosis of Wound #203 is a Lymphedema located on the Left,Anterior Ankle . There was a Four Layer Compression Therapy Procedure by Shawn Stall, RN. Post procedure Diagnosis Wound #203: Same as Pre-Procedure Wound #205 Pre-procedure diagnosis of Wound #205 is a Venous Leg Ulcer located on the Left,Posterior Lower Leg . There was a Four Layer Compression Therapy Procedure by Shawn Stall, RN. Post procedure Diagnosis Wound #205: Same as Pre-Procedure Plan Follow-up Appointments: Return Appointment in 1 week. [facility] Wednesday Bathing/ Shower/ Hygiene: May shower with protection but do not get wound dressing(s) wet. Edema Control - Lymphedema / SCD / Other: Lymphedema Pumps. Use Lymphedema pumps on leg(s) 2-3 times a day for 45-60 minutes. If wearing any wraps or hose, do not remove them. Continue exercising as instructed. Elevate legs to the level of the heart or above for 30 minutes daily and/or when sitting, a frequency of: - throughout the day Avoid standing for long periods of time. Exercise regularly WOUND #199: - T Third Wound Laterality: Right oe Cleanser: Soap and Water 1 x Per Week/ Discharge Instructions: May shower and wash wound with dial antibacterial soap and water prior to dressing change. Cleanser: Wound Cleanser 1 x Per Week/ Discharge Instructions: Cleanse  the wound with wound cleanser prior to applying a clean dressing using gauze sponges, not tissue or cotton balls. Peri-Wound Care: Zinc Oxide Ointment 30g tube 1 x Per Week/ Discharge Instructions: Apply Zinc Oxide to periwound with each dressing change Peri-Wound Care: Sween Lotion (Moisturizing lotion) 1 x Per Week/ Discharge Instructions: Apply moisturizing lotion as directed Prim Dressing: KerraCel Ag Gelling Fiber Dressing, 4x5 in (silver alginate) 1 x Per Week/ ary Discharge Instructions: Apply silver alginate to wound bed as instructed Secured With: Coban Self-Adherent Wrap 4x5 (in/yd) 1 x Per Week/ Discharge Instructions: Secure with Coban apply lightly as directed. Secured With: Insurance underwriter, Sterile 2x75 (in/in) 1 x Per Week/ Discharge Instructions: Secure with stretch gauze as directed. WOUND #200: - Lower Leg Wound Laterality: Left, Medial Cleanser: Soap and Water 1 x Per Week/ Discharge Instructions: May shower and wash wound with dial antibacterial soap and water prior to  dressing change. Cleanser: Wound Cleanser 1 x Per Week/ Discharge Instructions: Cleanse the wound with wound cleanser prior to applying a clean dressing using gauze sponges, not tissue or cotton balls. Peri-Wound Care: Zinc Oxide Ointment 30g tube 1 x Per Week/ Discharge Instructions: Apply Zinc Oxide to periwound with each dressing change Peri-Wound Care: Sween Lotion (Moisturizing lotion) 1 x Per Week/ Discharge Instructions: Apply moisturizing lotion as directed Prim Dressing: KerraCel Ag Gelling Fiber Dressing, 4x5 in (silver alginate) 1 x Per Week/ ary Discharge Instructions: Apply silver alginate to wound bed as instructed Secondary Dressing: ABD Pad, 8x10 1 x Per Week/ Discharge Instructions: Apply over primary dressing as directed. Com pression Wrap: FourPress (4 layer compression wrap) 1 x Per Week/ Discharge Instructions: ****UNNA BOOT FIRST LAYER APPLIED TO UPPER PORTION OF  LOWER LEG.*** WOUND #201: - Lower Leg Wound Laterality: Right, Anterior Cleanser: Soap and Water 1 x Per Week/ Discharge Instructions: May shower and wash wound with dial antibacterial soap and water prior to dressing change. Cleanser: Wound Cleanser 1 x Per Week/ Discharge Instructions: Cleanse the wound with wound cleanser prior to applying a clean dressing using gauze sponges, not tissue or cotton balls. Peri-Wound Care: Zinc Oxide Ointment 30g tube 1 x Per Week/ Discharge Instructions: Apply Zinc Oxide to periwound with each dressing change Peri-Wound Care: Sween Lotion (Moisturizing lotion) 1 x Per Week/ Discharge Instructions: Apply moisturizing lotion as directed Prim Dressing: KerraCel Ag Gelling Fiber Dressing, 4x5 in (silver alginate) 1 x Per Week/ ary Discharge Instructions: Apply silver alginate to wound bed as instructed Secondary Dressing: ABD Pad, 8x10 1 x Per Week/ Discharge Instructions: Apply over primary dressing as directed. Com pression Wrap: FourPress (4 layer compression wrap) 1 x Per Week/ Discharge Instructions: ****UNNA BOOT FIRST LAYER APPLIED TO UPPER PORTION OF LOWER LEG.*** WOUND #202: - T Fourth Wound Laterality: Right oe Cleanser: Soap and Water 1 x Per Week/ Discharge Instructions: May shower and wash wound with dial antibacterial soap and water prior to dressing change. Cleanser: Wound Cleanser 1 x Per Week/ Discharge Instructions: Cleanse the wound with wound cleanser prior to applying a clean dressing using gauze sponges, not tissue or cotton balls. Peri-Wound Care: Zinc Oxide Ointment 30g tube 1 x Per Week/ Discharge Instructions: Apply Zinc Oxide to periwound with each dressing change Peri-Wound Care: Sween Lotion (Moisturizing lotion) 1 x Per Week/ Discharge Instructions: Apply moisturizing lotion as directed Prim Dressing: KerraCel Ag Gelling Fiber Dressing, 4x5 in (silver alginate) 1 x Per Week/ ary Discharge Instructions: Apply silver alginate to  wound bed as instructed Secured With: Coban Self-Adherent Wrap 4x5 (in/yd) 1 x Per Week/ Discharge Instructions: Secure with Coban apply lightly as directed. Secured With: Insurance underwriter, Sterile 2x75 (in/in) 1 x Per Week/ Discharge Instructions: Secure with stretch gauze as directed. WOUND #203: - Ankle Wound Laterality: Left, Anterior Cleanser: Soap and Water 1 x Per Week/ Discharge Instructions: May shower and wash wound with dial antibacterial soap and water prior to dressing change. Cleanser: Wound Cleanser 1 x Per Week/ Discharge Instructions: Cleanse the wound with wound cleanser prior to applying a clean dressing using gauze sponges, not tissue or cotton balls. Peri-Wound Care: Zinc Oxide Ointment 30g tube 1 x Per Week/ Discharge Instructions: Apply Zinc Oxide to periwound with each dressing change Peri-Wound Care: Sween Lotion (Moisturizing lotion) 1 x Per Week/ Discharge Instructions: Apply moisturizing lotion as directed Prim Dressing: KerraCel Ag Gelling Fiber Dressing, 4x5 in (silver alginate) 1 x Per Week/ ary Discharge Instructions: Apply silver  alginate to wound bed as instructed Secondary Dressing: ABD Pad, 8x10 1 x Per Week/ Discharge Instructions: Apply over primary dressing as directed. Com pression Wrap: FourPress (4 layer compression wrap) 1 x Per Week/ Discharge Instructions: ****UNNA BOOT FIRST LAYER APPLIED TO UPPER PORTION OF LOWER LEG.*** WOUND #204: - T Second Wound Laterality: Left oe Cleanser: Soap and Water 1 x Per Week/ Discharge Instructions: May shower and wash wound with dial antibacterial soap and water prior to dressing change. Cleanser: Wound Cleanser 1 x Per Week/ Discharge Instructions: Cleanse the wound with wound cleanser prior to applying a clean dressing using gauze sponges, not tissue or cotton balls. Peri-Wound Care: Zinc Oxide Ointment 30g tube 1 x Per Week/ Discharge Instructions: Apply Zinc Oxide to periwound with each  dressing change Peri-Wound Care: Sween Lotion (Moisturizing lotion) 1 x Per Week/ Discharge Instructions: Apply moisturizing lotion as directed Prim Dressing: KerraCel Ag Gelling Fiber Dressing, 4x5 in (silver alginate) 1 x Per Week/ ary Discharge Instructions: Apply silver alginate to wound bed as instructed Secured With: Coban Self-Adherent Wrap 4x5 (in/yd) 1 x Per Week/ Discharge Instructions: Secure with Coban apply lightly as directed. Secured With: Insurance underwriter, Sterile 2x75 (in/in) 1 x Per Week/ Discharge Instructions: Secure with stretch gauze as directed. 1. Would recommend currently that we going to continue with the wound care measures as before and the patient is in agreement with plan this includes the use of the silver alginate to the open wound locations I think that still doing a good job. 2. I am also can recommend that we have the patient continue with the 4-layer compression wraps bilaterally. That seems to be doing decently well currently as well. 3. I am also going to suggest that we use Coban over the toes lightly in order to try to prevent anything from being able to get in and no maggots are definitely an issue we run into before and that is a risk especially for use outside and get things wet. Nonetheless we will see where things stand at follow-up next week. We will see patient back for reevaluation in 1 week here in the clinic. If anything worsens or changes patient will contact our office for additional recommendations. Electronic Signature(s) Signed: 09/26/2021 12:54:02 PM By: Lenda Kelp PA-C Entered By: Lenda Kelp on 09/26/2021 12:54:01 -------------------------------------------------------------------------------- SuperBill Details Patient Name: Date of Service: Kevin [name], Dejaun J. 09/26/2021 Medical Record Number: [id number] Patient Account Number: [id number] Date of Birth/Sex: Treating RN: [birth year] (70 y.o. 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 (idiopathic) with ulcer and inflammation of bilateral lower extremity L03.116 Cellulitis of left lower limb I89.0 Lymphedema, not elsewhere classified E11.40 Type 2 diabetes mellitus with diabetic neuropathy, unspecified E11.622 Type 2 diabetes mellitus with other skin ulcer L03.115 Cellulitis of right lower limb Physician Procedures : CPT4 Code Description Modifier [id number] - WC PHYS LEVEL 4 - EST PT ICD-10 Diagnosis Description L97.521 Non-pressure chronic ulcer of other 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 of skin L97.829  Non-pressure chronic ulcer of other part of left  lower leg with unspecified severity L97.519 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

## 2021-09-28 ENCOUNTER — Other Ambulatory Visit: Payer: Self-pay | Admitting: Family Medicine
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
## 2021-10-03 ENCOUNTER — Encounter (HOSPITAL_BASED_OUTPATIENT_CLINIC_OR_DEPARTMENT_OTHER): Payer: Medicare Other | Admitting: Physician Assistant

## 2021-10-03 ENCOUNTER — Other Ambulatory Visit: Payer: Self-pay

## 2021-10-03 ENCOUNTER — Other Ambulatory Visit: Payer: Self-pay | Admitting: Family Medicine

## 2021-10-03 DIAGNOSIS — L97829 Non-pressure chronic ulcer of other part of left lower leg with unspecified severity: Secondary | ICD-10-CM | POA: Diagnosis not present

## 2021-10-03 DIAGNOSIS — L97519 Non-pressure chronic ulcer of other part of right foot with unspecified severity: Secondary | ICD-10-CM | POA: Diagnosis not present

## 2021-10-03 DIAGNOSIS — I87333 Chronic venous hypertension (idiopathic) with ulcer and inflammation of bilateral lower extremity: Secondary | ICD-10-CM | POA: Diagnosis not present

## 2021-10-03 DIAGNOSIS — E1151 Type 2 diabetes mellitus with diabetic peripheral angiopathy without gangrene: Secondary | ICD-10-CM | POA: Diagnosis not present

## 2021-10-03 DIAGNOSIS — L97512 Non-pressure chronic ulcer of other part of right foot with fat layer exposed: Secondary | ICD-10-CM | POA: Diagnosis not present

## 2021-10-03 DIAGNOSIS — L97812 Non-pressure chronic ulcer of other part of right lower leg with fat layer exposed: Secondary | ICD-10-CM | POA: Diagnosis not present

## 2021-10-03 DIAGNOSIS — I890 Lymphedema, not elsewhere classified: Secondary | ICD-10-CM | POA: Diagnosis not present

## 2021-10-03 DIAGNOSIS — L97521 Non-pressure chronic ulcer of other 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 of skin: Secondary | ICD-10-CM | POA: Diagnosis not present

## 2021-10-03 DIAGNOSIS — L97322 Non-pressure chronic ulcer of left ankle with fat layer exposed: Secondary | ICD-10-CM | POA: Diagnosis not present

## 2021-10-03 NOTE — Progress Notes (Signed)
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 Distinct, outline attached Exudate Amount: Medium Exudate Type: Serosanguineous Exudate Color: red, brown Wound Bed Granulation Amount: Large (67-100%) Granulation Quality: Pink, Pale Necrotic Amount: None Present (0%) Foul Odor After Cleansing: No Slough/Fibrino No Exposed Structure Fascia Exposed: No Fat Layer (Subcutaneous Tissue) Exposed:  Yes Tendon Exposed: No Muscle Exposed: No Joint Exposed: No Bone Exposed: No Treatment Notes Wound #[id number] (Foot) Wound Laterality: Dorsal, Left Cleanser Soap and Water Discharge Instruction: May shower and wash wound with dial antibacterial soap and water prior to dressing change. Wound Cleanser Discharge Instruction: Cleanse the wound with wound cleanser prior to applying a clean dressing using gauze sponges, not tissue or cotton balls. Peri-Wound Care Zinc Oxide Ointment 30g tube Discharge Instruction: Apply Zinc Oxide to periwound with each dressing change Sween Lotion (Moisturizing lotion) Discharge Instruction: Apply moisturizing lotion as directed Topical Primary Dressing KerraCel Ag Gelling Fiber Dressing, 4x5 in (silver alginate) Discharge Instruction: Apply silver alginate to wound bed as instructed Secondary Dressing ABD Pad, 8x10 Discharge Instruction: Apply over primary dressing as directed. Secured With Compression Wrap FourPress (4 layer compression wrap) Discharge Instruction: ****UNNA BOOT FIRST LAYER APPLIED TO UPPER PORTION OF LOWER LEG.*** Compression Stockings Add-Ons Electronic Signature(s) Signed: 10/03/2021 5:54:55 PM By: Deon Pilling RN, BSN Entered By: Deon Pilling on 10/03/2021 11:27:50 -------------------------------------------------------------------------------- Wound Assessment Details Patient Name: Date of Service: [name], Eain J. 10/03/2021 10:30 A M Medical Record Number: [id number] Patient Account Number: [id number] Date of Birth/Sex: Treating RN: Oct 06, 1951 (70 y.o. [name], [name] Primary Care Westyn Keatley: [name] Other Clinician: Referring Shylynn Bruning: Treating Indalecio Malmstrom/Extender: [name] in Treatment: 297 Wound Status Wound Number: [id number] Primary Diabetic Wound/Ulcer of the Lower Extremity Etiology: Wound Location: Left T Third oe Secondary Lymphedema Wounding Event: Gradually  Appeared Etiology: Date Acquired: 10/03/2021 Wound Open Weeks Of Treatment: 0 Status: Clustered Wound: Yes Clustered Wound: Yes Comorbid Chronic sinus problems/congestion, Arrhythmia, Hypertension, History: Peripheral Arterial Disease, Type II Diabetes, History of Burn, Gout, Confinement Anxiety Wound Measurements Length: (cm) 2.5 Width: (cm) 1.6 Depth: (cm) 0.4 Clustered Quantity: 2 Area: (cm) 3.142 Volume: (cm) 1.257 % Reduction in Area: % Reduction in Volume: Epithelialization: None Tunneling: No Undermining: No Wound Description Classification: Grade 2 Wound Margin: Distinct, outline attached Exudate Amount: Medium Exudate Type: Serosanguineous Exudate Color: red, brown Foul Odor After Cleansing: No Slough/Fibrino Yes Wound Bed Granulation Amount: Large (67-100%) Exposed Structure Granulation Quality: Red, Pink, Hyper-granulation  Fascia Exposed: No Necrotic Amount: Small (1-33%) Fat Layer (Subcutaneous Tissue) Exposed: Yes Necrotic Quality: Adherent Slough Tendon Exposed: No Muscle Exposed: No Joint Exposed: No Bone Exposed: No Treatment Notes Wound #207 (Toe Third) Wound Laterality: Left Cleanser Soap and Water Discharge Instruction: May shower and wash wound with dial antibacterial soap and water prior to dressing change. Wound Cleanser Discharge Instruction: Cleanse the wound with wound cleanser prior to applying a clean dressing using gauze sponges, not tissue or cotton balls. Peri-Wound Care Zinc Oxide Ointment 30g tube Discharge Instruction: Apply Zinc Oxide to periwound with each dressing change Sween Lotion (Moisturizing lotion) Discharge Instruction: Apply moisturizing lotion as directed Topical Primary Dressing KerraCel Ag Gelling Fiber Dressing, 4x5 in (silver alginate) Discharge Instruction: Apply silver alginate to wound bed as instructed Secondary Dressing Secured With Coban Self-Adherent Wrap 4x5 (in/yd) Discharge Instruction: Secure  with Coban apply lightly as directed. Conforming Stretch Gauze Bandage, Sterile 2x75 (in/in) Discharge Instruction: Secure with stretch gauze as directed. Compression Wrap Compression Stockings Add-Ons Electronic Signature(s) Signed: 10/03/2021 5:54:55 PM By: Deon Pilling RN, BSN Entered By: Deon Pilling on 10/03/2021 11:29:35 -------------------------------------------------------------------------------- Vitals Details Patient Name: Date of Service: [name], Dinesh J. 10/03/2021 10:30 A M Medical Record Number: [id number] Patient Account Number: [id number] Date of Birth/Sex: Treating RN: August 07, 1951 (70 y.o. [name] Primary Care Jariel Drost: [name] Other Clinician: Referring Geneen Dieter: Treating Jerrianne Hartin/Extender: [name] in Treatment: 297 Vital Signs Time Taken: 10:53 Temperature (F): 98.0 Height (in): 70 Pulse (bpm): 67 Weight (lbs): 380.2 Respiratory Rate (breaths/min): 20 Body Mass Index (BMI): 54.5 Blood Pressure (mmHg): 160/65 Reference Range: 80 - 120 mg / dl Electronic Signature(s) Signed: 10/03/2021 3:47:30 PM By: Sandre Kitty Entered By: Sandre Kitty on 10/03/2021 10:53:42

## 2021-10-03 NOTE — Progress Notes (Addendum)
WIATT, MAHABIR ([id number]) Visit Report for 10/03/2021 Chief Complaint Document Details Patient Name: Date of Service: CO BAYAN, KUSHNIR 10/03/2021 10:30 A M Medical Record Number: [id number] Patient Account Number: [id number] Date of Birth/Sex: Treating RN: 06-08-1951 (70 y.o. [name] Primary Care Provider: Nicoletta Ba Other Clinician: Referring Provider: Treating Provider/Extender: Adele Dan in Treatment: 297 Information Obtained from: Patient Chief Complaint Patient is here for evaluation of venous/lymphedema ulcers Electronic Signature(s) Signed: 10/03/2021 11:00:51 AM By: Lenda Kelp PA-C Entered By: Lenda Kelp on 10/03/2021 11:00:50 -------------------------------------------------------------------------------- HPI Details Patient Name: Date of Service: [name], Dedrick J. 10/03/2021 10:30 A M Medical Record Number: [id number] Patient Account Number: [id number] Date of Birth/Sex: Treating RN: Dec 19, 1950 (70 y.o. [name] Primary Care Provider: Nicoletta Ba Other Clinician: Referring Provider: Treating Provider/Extender: Adele Dan in Treatment: 297 History of Present Illness HPI Description: Referred by PCP for consultation. Patient has long standing history of BLE venous stasis, no prior ulcerations. At beginning of month, developed cellulitis and weeping. Received IM Rocephin followed by Keflex and resolved. Wears compression stocking, appr 6 months old. Not sure strength. No present drainage. 01/22/16 this is a patient who is a type II diabetic on insulin. 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 He tells me he religiously wears pressure stockings of uncertain strength. He was here with weeping edema about 8 months ago but did not have an open wound. Roughly a month ago he had a reopening on his bilateral legs. He is been using bandages and Neosporin.  He does not complain of pain. 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 Last BUN/creatinine I have is from 11/20/15 at 13 and 1.0 respectively 01/29/16; patient arrives today having tolerated the Profore wrap. He brought in his stockings and these are 18 mmHg stockings he bought from [employer]. The compression here is likely inadequate. He does not complain of pain or excessive drainage she has no systemic symptoms. The wound on the right looks improved as does the one on the left although one on the left is more substantial with still tissue at risk below the actual wound area on the bilateral posterior calf 02/05/16; patient arrives with poor edema control. He states that we did put a 4 layer compression on it last week. No weight appear 5 this. 02/12/16; the area on the posterior right Has healed. The left Has a substantial wound that has necrotic surface eschar that requires a debridement with a curette. 02/16/16;the patient called or a Nurse visit secondary to increased swelling. He had been in earlier in the week with his right leg healed. He was transitioned to is on pressure stocking on the right leg with the only open wound on the left, a substantial area on the left posterior calf. Note he has a history of severe lower extremity edema, he has a history of chronic atrial fibrillation but not heart failure per my notes but I'll need to research this. He is not complaining of chest pain shortness of breath or orthopnea. The intake nurse noted blisters on the previously closed right leg 02/19/16; this is the patient's regular visit day. I see him on Friday with escalating edema new wounds on the right leg and clear signs of at least right ventricular heart failure. I increased his Lasix to 40 twice a  day. He is returning currently in follow-up. States he is noticed a decrease in that the  edema 02/26/16 patient's legs have much less edema. There is nothing really open on the right leg. The left leg has improved condition of the large superficial wound on the posterior left leg 03/04/16; edema control is very much better. The patient's right leg wounds have healed. On the left leg he continues to have severe venous inflammation on the posterior aspect of the left leg. There is no tenderness and I don't think any of this is cellulitis. 03/11/16; patient's right leg is married healed and he is in his own stocking. The patient's left leg has deteriorated somewhat. There is a lot of erythema around the wound on the posterior left leg. There is also a significant rim of erythema posteriorly just above where the wrap would've ended there is a new wound in this location and a lot of tenderness. Can't rule out cellulitis in this area. 03/15/16; patient's right leg remains healed and he is in his own stocking. The patient's left leg is much better than last review. His major wound on the posterior aspect of his left Is almost fully epithelialized. He has 3 small injuries from the wraps. Really. Erythema seems a lot better on antibiotics 03/18/16; right leg remains healed and he is in his own stocking. The patient's left leg is much better. The area on the posterior aspect of the left calf is fully epithelialized. His 3 small injuries which were wrap injuries on the left are improved only one seems still open his erythema has resolved 03/25/16; patient's right leg remains healed and he is in his own stocking. There is no open area today on the left leg posterior leg is completely closed up. His wrap injuries at the superior aspect of his leg are also resolved. He looks as though he has some irritation on the dorsal ankle but this is fully epithelialized without evidence of infection. 03/28/16; we discharged this patient on Monday. Transitioned him into his own stocking. There were problems almost  immediately with uncontrolled swelling weeping edema multiple some of which have opened. He does not feel systemically unwell in particular no chest pain no shortness of breath and he does not feel 04/08/16; the edema is under better control with the Profore light wrap but he still has pitting edema. There is one large wound anteriorly 2 on the medial aspect of his left leg and 3 small areas on the superior posterior calf. Drainage is not excessive he is tolerating a Profore light well 04/15/16; put a Profore wrap on him last week. This is controlled is edema however he had a lot of pain on his left anterior foot most of his wounds are healed 04/22/16 once again the patient has denuded areas on the left anterior foot which he states are because his wrap slips up word. He saw his primary physician today is on Lasix 40 twice a day and states that he his weight is down 20 pounds over the last 3 months. 04/29/16: Much improved. left anterior foot much improved. He is now on Lasix 80 mg per day. Much improved edema control 05/06/16; I was hoping to be able to discharge him today however once again he has blisters at a low level of where the compression was placed last week mostly on his left lateral but also his left medial leg and a small area on the anterior part of the left foot. 05/09/16; apparently the patient  went home after his appointment on 7/4 later in the evening developing pain in his upper medial thigh together with subjective fever and chills although his temperature was not taken. The pain was so intense he felt he would probably have to call 911. However he then remembered that he had leftover doxycycline from a previous round of antibiotics and took these. By the next morning he felt a lot better. He called and spoke to one of our nurses and I approved doxycycline over the phone thinking that this was in relation to the wounds we had previously seen although they were definitely were not. The  patient feels a lot better old fever no chills he is still working. Blood sugars are reasonably controlled 05/13/16; patient is back in for review of his cellulitis on his anterior medial upper thigh. He is taking doxycycline this is a lot better. Culture I did of the nodular area on the dorsal aspect of his foot grew MRSA this also looks a lot better. 05/20/16; the patient is cellulitis on the medial upper thigh has resolved. All of his wound areas including the left anterior foot, areas on the medial aspect of the left calf and the lateral aspect of the calf at all resolved. He has a new blister on the left dorsal foot at the level of the fourth toe this was excised. No evidence of infection 05/27/16; patient continues to complain weeping edema. He has new blisterlike wounds on the left anterior lateral and posterior lateral calf at the top of his wrap levels. The area on his left anterior foot appears better. He is not complaining of fever, pain or pruritus in his feet. 05/30/16; the patient's blisters on his left anterior leg posterior calf all look improved. He did not increase the Lasix 100 mg as I suggested because he was going to run out of his 40 mg tablets. He is still having weeping edema of his toes 06/03/16; I renewed his Lasix at 80 mg once a day as he was about to run out when I last saw him. He is on 80 mg of Lasix now. I have asked him to cut down on the excessive amount of water he was drinking and asked him to drink according to his thirst mechanisms 06/12/2016 -- was seen 2 days ago and was supposed to wear his compression stockings at home but he is developed lymphedema and superficial blisters on the left lower extremity and hence came in for a review 06/24/16; the remaining wound is on his left anterior leg. He still has edema coming from between his toes. There is lymphedema here however his edema is generally better than when I last saw this. He has a history of atrial fibrillation  but does not have a known history of congestive heart failure nevertheless I think he probably has this at least on a diastolic basis. 07/01/16 I reviewed his echocardiogram from January 2017. This was essentially normal. He did not have LVH, EF of 55-60%. His right ventricular function was normal although he did have trivial tricuspid and pulmonic regurgitation. This is not audible on exam however. I increased his Lasix to do massive edema in his legs well above his knees I think in early July. He was also drinking an excessive amount of water at the time. 07/15/16; missed his appointment last week because of the Labor Day holiday on Monday. He could not get another appointment later in the week. Started to feel the wrap digging in superiorly  so we remove the top half and the bottom half of his wrap. He has extensive erythema and blistering superiorly in the left leg. Very tender. Very swollen. Edema in his foot with leaking edema fluid. He has not been systemically unwell 07/22/16; the area on the left leg laterally required some debridement. The medial wounds look more stable. His wrap injury wounds appear to have healed. Edema and his foot is better, weeping edema is also better. He tells me he is meeting with the supplier of the external compression pumps at work 08/05/16; [place] His wrap is been on for an extended period of time. Also over the weekend he developed an extensive area of tender erythema across his anterior medial thigh. He took to doxycycline yesterday that he had leftover from a previous prescription. The patient complains of weeping edema coming out of his toes 08/08/16; I saw this patient on 10/2. He was tender across his anterior thigh. I put him on doxycycline. He returns today in follow-up. He does not have any open wounds on his lower leg, he still has edema weeping into his toes. 08/12/16; patient was seen back urgently today to  follow-up for his extensive left thigh cellulitis/erysipelas. He comes back with a lot less swelling and erythema pain is much better. I believe I gave him Augmentin and Cipro. His wrap was cut down as he stated a roll down his legs. He developed blistering above the level of the wrap that remained. He has 2 open blisters and 1 intact. 08/19/16; patient is been doing his primary doctor who is increased his Lasix from 40-80 once a day or 80 already has less edema. Cellulitis has remained improved in the left thigh. 2 open areas on the posterior left calf 08/26/16; he returns today having new open blisters on the anterior part of his left leg. He has his compression pumps but is not yet been shown how to use some vital representative from the supplier. 09/02/16 patient returns today with no open wounds on the left leg. Some maceration in his plantar toes 09/10/2016 -- Dr. Leanord Hawkingobson had recently discharged him on 09/02/2016 and he has come right back with redness swelling and some open ulcers on his left lower extremity. He says this was caused by trying to apply his compression stockings and he's been unable to use this and has not been able to use his lymphedema pumps. He had some doxycycline leftover and he has started on this a few days ago. 09/16/16; there are no open wounds on his leg on the left and no evidence of cellulitis. 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 He does not complain of symptoms here. I am not clear use using his external compression pumps. 09/23/16; I have not seen this patient in 2 weeks. He canceled his appointment 10 days ago as he was going on vacation. He tells me that on Monday he noticed a large area on his posterior left leg which is been draining copiously and is reopened into a large wound. He is been using ABDs and the external part of his juxtalite, according to our nurse this was not on properly. 10/07/16;  Still a substantial area on the posterior left leg. Using silver alginate 10/14/16; in general better although there is still open area which looks healthy. Still using silver alginate. He reminds me that this happen [place] T oday while he was showering in the morning. He had been using his juxtalite's 10/21/16; the area on his posterior left leg is fully epithelialized. However he arrives today with a large area of tender erythema in his medial and posterior left thigh just above the knee. I have marked the area. Once again he is reluctant to consider hospitalization. I treated him with oral antibiotics in the past for a similar situation with resolution I think with doxycycline however this area it seems more extensive to me. He is not complaining of fever but does have chills and says states he is thirsty. His blood sugar today was in the 140s at home 10/25/16 the area on his posterior left leg is fully epithelialized although there is still some weeping edema. The large area of tenderness and erythema in his medial and posterior left thigh is a lot less tender although there is still a lot of swelling in this thigh. He states he feels a lot better. He is on doxycycline and Augmentin that I started last week. This will continued until Tuesday, December 26. I have ordered a duplex ultrasound of the left thigh rule out DVT whether there is an abscess something that would need to be drained I would also like to know. 11/01/16; he still has weeping edema from a not fully epithelialized area on his left posterior calf. Most of the rest of this looks a lot better. He has completed his antibiotics. His thigh is a lot better. Duplex ultrasound did not show a DVT in the thigh 11/08/16; he comes in today with more Denuded surface epithelium from the posterior aspect of his calf. There is no real evidence of cellulitis. The superior aspect of his wrap appears to have put quite an  indentation in his leg just below the knee and this may have contributed. He does not complain of pain or fever. We have been using silver alginate as the primary dressing. The area of cellulitis in the right thigh has totally resolved. He has been using his compression stockings once a week 11/15/16; the patient arrives today with more loss of epithelium from the posterior aspect of his left calf. He now has a fairly substantial wound in this area. The reason behind this deterioration isn't exactly clear although his edema is not well controlled. He states he feels he is generally more swollen systemically. He is not complaining of chest pain shortness of breath fever. T me he has an appointment with his primary physician in early February. He is on 80 mg of oral ells Lasix a day. He claims compliance with the external compression pumps. He is not having any pain in his legs similar to what he has with his recurrent cellulitis 11/22/16; the patient arrives a follow-up of his large area on his left lateral calf. This looks somewhat better today. He came in earlier in the week for a dressing change since I saw him a week ago. He is not complaining of any pain no shortness of breath no chest pain 11/28/16; the patient arrives for follow-up of his large area on the left lateral calf this does not look better. In fact it is larger weeping edema. The surface of the wound does not look too bad. We have been using silver alginate although I'm not certain that this is a dressing issue. 12/05/16; again the patient follows up for a large wound on the left lateral and left posterior calf this does not look better. There  continues to be weeping edema necrotic surface tissue. More worrisome than this once again there is erythema below the wound involving the distal Achilles and heel suggestive of cellulitis. He is on his feet working most of the day of this is not going well. We are changing his dressing twice a week to  facilitate the drainage. 12/12/16; not much change in the overall dimensions of the large area on the left posterior calf. This is very inflamed looking. I gave him an. Doxycycline last week does not really seem to have helped. He found the wrap very painful indeed it seems to of dog into his legs superiorly and perhaps around the heel. He came in early today because the drainage had soaked through his dressings. 12/19/16- patient arrives for follow-up evaluation of his left lower extremity ulcers. He states that he is using his lymphedema pumps once daily when there is "no drainage". He admits to not using his lipedema pumps while under current treatment. His blood sugars have been consistently between 150-200. 12/26/16; the patient is not using his compression pumps at home because of the wetness on his feet. I've advised him that I think it's important for him to use this daily. He finds his feet too wet, he can put a plastic bag over his legs while he is in the pumps. Otherwise I think will be in a vicious circle. We are using silver alginate to the major area on his left posterior calf 01/02/17; the patient's posterior left leg has further of all into 3 open wounds. All of them covered with a necrotic surface. He claims to be using his compression pumps once a day. His edema control is marginal. Continue with silver alginate 01/10/17; the patient's left posterior leg actually looks somewhat better. There is less edema, less erythema. Still has 3 open areas covered with a necrotic surface requiring debridement. He claims to be using his compression pumps once a day his edema control is better 01/17/17; the patient's left posterior calf look better last week when I saw him and his wrap was changed 2 days ago. He has noted increasing pain in the left heel and arrives today with much larger wounds extensive erythema extending down into the entire heel area especially tender medially. He is not  systemically unwell CBGs have been controlled no fever. Our intake nurse showed me limegreen drainage on his AVD pads. 01/24/17; his usual this patient responds nicely to antibiotics last week giving him Levaquin for presumed Pseudomonas. The whole entire posterior part of his leg is much better much less inflamed and in the case of his Achilles heel area much less tender. He has also had some epithelialization posteriorly there are still open areas here and still draining but overall considerably better 01/31/17- He has continue to tolerate the compression wraps. he states that he continues to use the lymphedema pumps daily, and can increase to twice daily on the weekends. He is voicing no complaints or concerns regarding his LLE ulcers 02/07/17-he is here for follow-up evaluation. He states that he noted some erythema to the left medial and anterior thigh, which he states is new as of yesterday. He is concerned about recurrent cellulitis. He states his blood sugars have been slightly elevated, this morning in the 180s 02/14/17; he is here for follow-up evaluation. When he was last here there was erythema superiorly from his posterior wound in his anterior thigh. He was prescribed Levaquin however a culture of the wound surface grew MRSA over  the phone I changed him to doxycycline on Monday and things seem to be a lot better. 02/24/17; patient missed his appointment on Friday therefore we changed his nurse visit into a physician visit today. Still using silver alginate on the large area of the posterior left thigh. He isn't new area on the dorsal left second toe 03/03/17; actually better today although he admits he has not used his external compression pumps in the last 2 days or so because of work responsibilities over the weekend. 03/10/17; continued improvement. External compression pumps once a day almost all of his wounds have closed on the posterior left calf. Better edema control 03/17/17; in general  improved. He still has 3 small open areas on the lateral aspect of his left leg however most of the area on the posterior part of his leg is epithelialized. He has better edema control. He has an ABD pad under his stocking on the right anterior lower leg although he did not let us look at that today. 03/24/17; patient arrives back in clinic today with no open areas however there are areas on the posterior left calf and anterior left calf that are less than 100% epithelialized. His edema is well controlled in the left lower leg. There is some pitting edema probably lymphedema in the left upper thigh. He uses compression pumps at home once per day. I tried to get him to do this twice a day although he is very reticent. 04/01/2017 -- for the last 2 days he's had significant redness, tenderness and weeping and came in for an urgent visit today. 04/07/17; patient still has 6 more days of doxycycline. He was seen by Dr. Meyer Russel last Wednesday for cellulitis involving the posterior aspect, lateral aspect of his Involving his heel. For the most part he is better there is less erythema and less weeping. He has been on his feet for 12 hours 2 over the weekend. Using his compression pumps once a day 04/14/17 arrives today with continued improvement. Only one area on the posterior left calf that is not fully epithelialized. He has intense bilateral venous inflammation associated with his chronic venous insufficiency disease and secondary lymphedema. We have been using silver alginate to the left posterior calf wound In passing he tells [place] today that the right leg but we have not seen in quite some time has an open area on it but he doesn't want [place] to look at this today states he will show this to [place] next week. 04/21/17; there is no open area on his left leg although he still reports some weeping edema. He showed [place] his right leg today which is the first time we've seen this leg in a long time. He has a large area of  open wound on the right leg anteriorly healthy granulation. Quite a bit of swelling in the right leg and some degree of venous inflammation. He told us about the right leg in passing last week but states that deterioration in the right leg really only happened over the weekend 04/28/17; there is no open area on the left leg although there is an irritated part on the posterior which is like a wrap injury. The wound on the right leg which was new from last week at least to [place] is a lot better. 05/05/17; still no open area on the left leg. Patient is using his new compression stocking which seems to be doing a good job of controlling the edema. He states he is using his  compression pumps once per day. The right leg still has an open wound although it is better in terms of surface area. Required debridement. A lot of pain in the posterior right Achilles marked tenderness. Usually this type of presentation this patient gives concern for an active cellulitis 05/12/17; patient arrives today with his major wound from last week on the right lateral leg somewhat better. Still requiring debridement. He was using his compression stocking on the left leg however that is reopened with superficial wounds anteriorly he did not have an open wound on this leg previously. He is still using his juxta light's once daily at night. He cannot find the time to do this in the morning as he has to be at work by 7 AM 05/19/17; right lateral leg wound looks improved. No debridement required. The concerning area is on the left posterior leg which appears to almost have a subcutaneous hemorrhagic component to it. We've been using silver alginate to all the wounds 05/26/17; the right lateral leg wound continues to look improved. However the area on the left posterior calf is a tightly adherent surface. [place] using silver alginate. Because of the weeping edema in his legs there is very little good alternatives. 06/02/17; the patient left  here last week looking quite good. Major wound on the left posterior calf and a small one on the right lateral calf. Both of these look satisfactory. He tells me that by Wednesday he had noted increased pain in the left leg and drainage. He called on Thursday and Friday to get an appointment here but we were blocked. He did not go to urgent care or his primary physician. He thinks he had a fever on Thursday but did not actually take his temperature. He has not been using his compression pumps on the left leg because of pain. I advised him to go to the emergency room today for IV antibiotics for stents of left leg cellulitis but he has refused I have asked him to take 2 days off work to keep his leg elevated and he has refused this as well. In view of this I'm going to call him and Augmentin and doxycycline. 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 However after a detailed discussion regarding the risk benefits and alternatives he refuses to get admitted to the hospital. With no other recourse I will continue him on oral antibiotics as before and hopefully he'll have his infectious disease consultation this week. 06/16/2017 -- the patient was seen today by the nurse practitioner at infectious disease Ms. Dixon. 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 He has also been advise Lotrimin cream locally. she also had wise IV antibiotics for his condition if it flares up 06/23/17; patient arrives today with drainage bilaterally although the remaining wound on the left posterior calf after  cleaning up today "highlighter yellow drainage" did not look too bad. Unfortunately he has had breakdown on the right anterior leg [previously this leg had not been open and he is using a black stocking] he went to see infectious  disease and is been put on clindamycin 150 mg daily, I did not verify the dose although I'm not familiar with using clindamycin in this dosing range, perhaps for prophylaxisoo 06/27/17; I brought this patient back today to follow-up on the wound deterioration on the right lower leg together with surrounding cellulitis. I started him on doxycycline 4 days ago. This area looks better however he comes in today with intense cellulitis on the medial part of his left thigh. This is not have a wound in this area. Extremely tender. We've been using silver alginate to the wounds on the right lower leg left lower leg with bilateral 4 layer compression he is using his external compression pumps once a day 07/04/17; patient's left medial thigh cellulitis looks better. He has not been using his compression pumps as his insert said it was contraindicated with cellulitis. His right leg continues to make improvements all the wounds are still open. We only have one remaining wound on the left posterior calf. Using silver alginate to all open areas. He is on doxycycline which I started a week ago and should be finishing I gave him Augmentin after Thursday's visit for the severe cellulitis on the left medial thigh which fortunately looks better 07/14/17; the patient's left medial thigh cellulitis has resolved. The cellulitis in his right lower calf on the right also looks better. All of his wounds are stable to improved we've been using silver alginate he has completed the antibiotics I have given him. He has clindamycin 150 mg once a day prescribed by infectious disease for prophylaxis, I've advised him to start this now. We have been using bilateral Unna boots over silver alginate to the wound  areas 07/21/17; the patient is been to see infectious disease who noted his recurrent problems with cellulitis. He was not able to tolerate prophylactic clindamycin therefore he is on amoxicillin 500 twice a day. He also had a second daily dose of Lasix added By Dr. Oneta Rack but he is not taking this. Nor is he being completely compliant with his compression pumps a especially not this week. He has 2 remaining wounds one on the right posterior lateral lower leg and one on the left posterior medial lower leg. 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 The patient has Unna boots bilaterally. 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 He has bilateral Unna boots for compression and silver alginate to his wounds. Arrives today with his legs looking as good as I have seen him in quite some time. Not surprisingly his wounds look better as well with improvement on the right lateral leg venous insufficiency wound and also the left medial leg. He is still using the compression pumps once a day 08/11/17; both legs appear to be doing better wounds on the right lateral and left medial legs look better. Skin on the right leg quite good. He is been using silver alginate as the primary dressing. I'm going to use Anasept gel calcium alginate and maintain all the secondary dressings 08/18/17; the patient continues to actually do quite well. The area on his right lateral leg is just about closed the left medial also looks better although it is still moist in this area. His edema is well controlled we have been using Anasept gel with calcium alginate  and the usual secondary dressings, 4 layer compression and once daily use of his compression pumps "always been able to manage 09/01/17; the patient  continues to do reasonably well in spite of his trip to [place]. The area on the right lateral leg is epithelialized. Left is much better but still open. He has more edema and more chronic erythema on the left leg [venous inflammation] 09/08/17; he arrives today with no open wound on the right lateral leg and decently controlled edema. Unfortunately his left leg is not nearly as in his good situation as last week.he apparently had increasing edema starting on Saturday. He edema soaked through into his foot so used a plastic bag to walk around his home. The area on the medial right leg which was his open area is about the same however he has lost surface epithelium on the left lateral which is new and he has significant pain in the Achilles area of the left foot. He is already on amoxicillin chronically for prophylaxis of cellulitis in the left leg 09/15/17; he is completed a week of doxycycline and the cellulitis in the left posterior leg and Achilles area is as usual improved. He still has a lot of edema and fluid soaking through his dressings. There is no open wound on the right leg. He saw infectious disease NP today 09/22/17;As usual 1 we transition him from our compression wraps to his stockings things did not go well. He has several small open areas on the right leg. He states this was caused by the compression wrap on his skin although he did not wear this with the stockings over them. He has several superficial areas on the left leg medially laterally posteriorly. He does not have any evidence of active cellulitis especially involving the left Achilles The patient is traveling from [facility] Saturday going to [facility]. He states he isn't attempting to get an appointment with a heel objects wound center there to change his dressings. I am not completely certain whether this will work 10/06/17; the patient came in on Friday for a nurse visit and the nurse reported that his legs actually look  quite good. He arrives in clinic today for his regular follow-up visit. He has a new wound on his left third toe over the PIP probably caused by friction with his footwear. He has small areas on the left leg and a very superficial but epithelialized area on the right anterior lateral lower leg. Other than that his legs look as good as I've seen him in quite some time. We have been using silver alginate Review of systems; no chest pain no shortness of breath other than this a 10 point review of systems negative 10/20/17; seen by Dr. Meyer Russel last week. He had taken some antibiotics [doxycycline] that he had left over. Dr. Meyer Russel thought he had candida infection and declined to give him further antibiotics. He has a small wound remaining on the right lateral leg several areas on the left leg including a larger area on the left posterior several left medial and anterior and a small wound on the left lateral. The area on the left dorsal third toe looks a lot better. ROS; Gen.; no fever, respiratory no cough no sputum Cardiac no chest pain other than this 10 point review of system is negative 10/30/17; patient arrives today having fallen in the bathtub 3 days ago. It took him a while to get up. He has pain and maceration in the wounds on his  left leg which have deteriorated. He has not been using his pumps he also has some maceration on the right lateral leg. 11/03/17; patient continues to have weeping edema especially in the left leg. This saturates his dressings which were just put on on 12/27. As usual the doxycycline seems to take care of the cellulitis on his lower leg. He is not complaining of fever, chills, or other systemic symptoms. He states his leg feels a lot better on the doxycycline I gave him empirically. He also apparently gets injections at his primary doctor's officeo Rocephin for cellulitis prophylaxis. I didn't ask him about his compression pump compliance today I think that's probably  marginal. Arrives in the clinic with all of his dressings primary and secondary macerated full of fluid and he has bilateral edema 11/10/17; the patient's right leg looks some better although there is still a cluster of wounds on the right lateral. The left leg is inflamed with almost circumferential skin loss medially to laterally although we are still maintaining anteriorly. He does not have overt cellulitis there is a lot of drainage. He is not using compression pumps. We have been using silver alginate to the wound areas, there are not a lot of options here 11/17/17; the patient's right leg continues to be stable although there is still open wounds, better than last week. The inflammation in the left leg is better. Still loss of surface layer epithelium especially posteriorly. There is no overt cellulitis in the amount of edema and his left leg is really quite good, tells me he is using his compression pumps once a day. 11/24/17; patient's right leg has a small superficial wound laterally this continues to improve. The inflammation in the left leg is still improving however we have continuous surface layer epithelial loss posteriorly. There is no overt cellulitis in the amount of edema in both legs is really quite good. He states he is using his compression pumps on the left leg once a day for 5 out of 7 days 12/01/17; very small superficial areas on the right lateral leg continue to improve. Edema control in both legs is better today. He has continued loss of surface epithelialization and left posterior calf although I think this is better. We have been using silver alginate with large number of absorptive secondary dressings 4 layer on the left Unna boot on the right at his request. He tells me he is using his compression pumps once a day 12/08/17; he has no open area on the right leg is edema control is good here. On the left leg however he has marked erythema and tenderness breakdown of skin. He has  what appears to be a wrap injury just distal to the popliteal fossa. 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 There was swelling erythema and breakdown in the right leg. He has much worse skin breakdown in the left leg as well multiple open areas medially and posteriorly as well as laterally. He tells me he has been using his compression pumps but tells me he feels that the drainage out of his leg is worse when he uses a compression pumps. T be fair to him he is been saying this o for a while however I  don't know that I have really been listening to this. I wonder if the compression pumps are working properly 12/22/17;. Once again he arrives with severe erythema, weeping edema from the left greater than right leg. Noncompliance with compression pumps. New this visit he is complaining of pain on the lateral aspect of the right leg and the medial aspect of his right thigh. He apparently saw his cardiologist Dr. [name] who was ordered an echocardiogram area and I think this is a step in the right direction 12/25/17; started his doxycycline Monday night. There is still intense erythema of the right leg especially in the anterior thigh although there is less tenderness. The erythema around the wound on the right lateral calf also is less tender. He still complaining of pain in the left heel. His wounds are about the same right lateral left medial left lateral. Superficial but certainly not close to closure. He denies being systemically unwell no fever chills no abdominal pain no diarrhea 12/29/17; back in follow-up of his extensive right calf and right thigh cellulitis. I added amoxicillin to cover possible doxycycline resistant  strep. This seems to of done the trick he is in much less pain there is much less erythema and swelling. He has his echocardiogram at 11:00 this morning. X-ray of the left heel was also negative. 01/05/18; the patient arrived with his edema under much better control. Now that he is retired he is able to use his compression pumps daily and sometimes twice a day per the patient. He has a wound on the right leg the lateral wound looks better. Area on the left leg also looks a lot better. He has no evidence of cellulitis in his bilateral thighs I had a quick peak at his echocardiogram. He is in normal ejection fraction and normal left ventricular function. He has moderate pulmonary hypertension moderately reduced right ventricular function. One would have to wonder about chronic sleep apnea although he says he doesn't snore. He'll review the echocardiogram with his cardiologist. 01/12/18; the patient arrives with the edema in both legs under exemplary control. He is using his compression pumps daily and sometimes twice daily. His wound on the right lateral leg is just about closed. He still has some weeping areas on the posterior left calf and lateral left calf although everything is just about closed here as well. I have spoken with Aldean Baker who is the patient's nurse practitioner and infectious disease. She was concerned that the patient had not understood that the parenteral penicillin injections he was receiving for cellulitis prophylaxis was actually benefiting him. I don't think the patient actually saw that I would tend to agree we were certainly dealing with less infections although he had a serious one last month. 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 He is healed. He'll be placed in juxtalite compression wraps and increase his lymphedema pumps to twice daily. We will follow up again next week to ensure there are no issues with the new  regiment. 01/20/18-he is here for evaluation of bilateral lower extremity weeping edema. Yesterday he was placed in compression wrap to the right lower extremity and compression stocking to left lower shrubbery. He states he uses lymphedema pumps last night and again this morning and noted a blister to the left lower extremity. On exam he was noted to have drainage to the right lower extremity. He will be placed in Unna boots bilaterally and follow-up next week 01/26/18; patient was actually discharged a  week ago to his own juxta light stockings only to return the next day with bilateral lower extremity weeping edema.he was placed in bilateral Unna boots. He arrives today with pain in the back of his left leg. There is no open area on the right leg however there is a linear/wrap injury on the left leg and weeping edema on the left leg posteriorly. I spoke with infectious disease about 10 days ago. They were disappointed that the patient elected to discontinue prophylactic intramuscular penicillin shots as they felt it was particularly beneficial in reducing the frequency of his cellulitis. I discussed this with the patient today. He does not share this view. He'll definitely need antibiotics today. Finally he is traveling to [place] and trauma leaving this Saturday and returning a week later and he does not travel with his pumps. He is going by car 01/30/18; patient was seen 4 days ago and brought back in today for review of cellulitis in the left leg posteriorly. I put him on amoxicillin this really hasn't helped as much as I might like. He is also worried because he is traveling to [facility] by car. Finally we will be rewrapping him. There is no open area on the right leg over his left leg has multiple weeping areas as usual 02/09/18; The same wrap on for 10 days. He did not pick up the last doxycycline I prescribed for him. He apparently took 4 days worth he already had. There is nothing open on  his right leg and the edema control is really quite good. He's had damage in the left leg medially and laterally especially probably related to the prolonged use of Unna boots 02/12/18; the patient arrived in clinic today for a nurse visit/wrap change. He complained of a lot of pain in the left posterior calf. He is taking doxycycline that I previously prescribed for him. Unfortunately even though he used his stockings and apparently used to compression pumps twice a day he has weeping edema coming out of the lateral part of his right leg. This is coming from the lower anterior lateral skin area. 02/16/18; the patient has finished his doxycycline and will finish the amoxicillin 2 days. The area of cellulitis in the left calf posteriorly has resolved. He is no longer having any pain. He tells me he is using his compression pumps at least once a day sometimes twice. 02/23/18; the patient finished his doxycycline and Amoxil last week. On Friday he noticed a small erythematous circle about the size of a quarter on the left lower leg just above his ankle. This rapidly expanded and he now has erythema on the lateral and posterior part of the thigh. This is bright red. Also has an area on the dorsal foot just above his toes and a tender area just below the left popliteal fossa. He came off his prophylactic penicillin injections at his own insistence one or 2 months ago. This is obviously deteriorated since then 03/02/18; patient is on doxycycline and Amoxil. Culture I did last week of the weeping area on the back of his left calf grew group B strep. I have therefore renewed the amoxicillin 500 3 times a day for a further week. He has not been systemically unwell. Still complaining of an area of discomfort right under his left popliteal fossa. There is no open wound on the right leg. He tells me that he is using his pumps twice a day on most days 03/09/18; patient arrives in clinic today completing  his amoxicillin  today. The cellulitis on his left leg is better. Furthermore he tells me that he had intramuscular penicillin shots that his primary care office today. However he also states that the wrap on his right leg fell down shortly after leaving clinic last week. He developed a large blister that was present when he came in for a nurse visit later in the week and then he developed intense discomfort around this area.He tells me he is using his compression pumps 03/16/18; the patient has completed his doxycycline. The infectious part of this/cellulitis in the left heel area left popliteal area is a lot better. He has 2 open areas on the right calf. Still areas on the left calf but this is a lot better as well. 03/24/18; the patient arrives complaining of pain in the left popliteal area again. He thinks some of this is wrap injury. He has no open area on the right leg and really no open area on the left calf either except for the popliteal area. He claims to be compliant with the compression pumps 03/31/18; I gave him doxycycline last week because of cellulitis in the left popliteal area. This is a lot better although the surface epithelium is denuded off and response to this. He arrives today with uncontrolled edema in the right calf area as well as a fingernail injury in the right lateral calf. There is only a few open areas on the left 04/06/18; I gave him amoxicillin doxycycline over the last 2 weeks that the amoxicillin should be completing currently. He is not complaining of any pain or systemic symptoms. The only open areas see has is on the right lateral lower leg paradoxically I cannot see anything on the left lower leg. He tells me he is using his compression pumps twice a day on most days. Silver alginate to the wounds that are open under 4 layer compression 04/13/18; he completed antibiotics and has no new complaints. Using his compression pumps. Silver alginate that anything that's opened 04/20/18; he is  using his compression pumps religiously. Silver alginate 4 layer compression anything that's opened. He comes in today with no open wounds on the left leg but 3 on the right including a new one posteriorly. He has 2 on the right lateral and one on the right posterior. He likes Unna boots on the right leg for reasons that aren't really clear we had the usual 4 layer compression on the left. It may be necessary to move to the 4 layer compression on the right however for now I left them in the Unna boots 04/27/18; he is using his compression pumps at least once a day. He has still the wounds on the right lateral calf. The area right posteriorly has closed. He does not have an open wound on the left under 4 layer compression however on the dorsal left foot just proximal to the toes and the left third toe 2 small open areas were identified 05/11/18; he has not uses compression pumps. The areas on the right lateral calf have coalesced into one large wound necrotic surface. On the left side he has one small wound anteriorly however the edema is now weeping out of a large part of his left leg. He says he wasn't using his pumps because of the weeping fluid. I explained to him that this is the time he needs to pump more 05/18/18; patient states he is using his compression pumps twice a day. The area on the right lateral  large wound albeit superficial. On the left side he has innumerable number of small new wounds on the left calf particularly laterally but several anteriorly and medially. All these appear to have healthy granulated base these look like the remnants of blisters however they occurred under compression. The patient arrives in clinic today with his legs somewhat better. There is certainly less edema, less multiple open areas on the left calf and the right anterior leg looks somewhat better as well superficial and a little smaller. However he relates pain and erythema over the last 3-4 days in the thigh  and I looked at this today. He has not been systemically unwell no fever no chills no change in blood sugar values 05/25/18; comes in today in a better state. The severe cellulitis on his left leg seems better with the Keflex. Not as tender. He has not been systemically unwell Hard to find an open wound on the left lower leg using his compression pumps twice a day The confluent wounds on his right lateral calf somewhat better looking. These will ultimately need debridement I didn't do this today. 06/01/18; the severe cellulitis on the left anterior thigh has resolved and he is completed his Keflex. There is no open wound on the left leg however there is a superficial excoriation at the base of the third toe dorsally. Skin on the bottom of his left foot is macerated looking. The left the wounds on the lateral right leg actually looks some better although he did require debridement of the top half of this wound area with an open curet 06/09/18 on evaluation today patient appears to be doing poorly in regard to his right lower extremity in particular this appears to likely be infected he has very thick purulent discharge along with a bright green tent to the discharge. This makes me concerned about the possibility of pseudomonas. He's also having increased discomfort at this point on evaluation. Fortunately there does not appear to be any evidence of infection spreading to the other location at this time. 06/16/18 on evaluation today patient appears to actually be doing fairly well. His ulcer has actually diminished in size quite significantly at this point which is good news. Nonetheless he still does have some evidence of infection he did see infectious disease this morning before coming here for his appointment. I did review the results of their evaluation and their note today. They did actually have him discontinue the Cipro and initiate treatment with linezolid at this time. He is doing this for the  next seven days and they recommended a follow-up in four months with them. He is the keep a log of the need for intermittent antibiotic therapy between now and when he falls back up with infectious disease. This will help them gaze what exactly they need to do to try and help them out. 06/23/18; the patient arrives today with no open wounds on the left leg and left third toe healed. He is been using his compression pumps twice a day. On the right lateral leg he still has a sizable wound but this is a lot better than last time I saw this. 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 Has been using silver alginate under 4 layer compression 06/30/18; the only open wound he has is on the right lateral leg and this looks healthy. No debridement is required. We have been using silver  alginate. He does not have an open wound on the left leg. There is apparently some drainage from the dorsal proximal third toe on the left although I see no open wound here. 07/03/18 on evaluation today patient was actually here just for a nurse visit rapid change. However when he was here on Wednesday for his rat change due to having been healed on the left and then developing blisters we initiated the wrap again knowing that he would be back today for [place] to reevaluate and see were at. Unfortunately he has developed some cellulitis into the proximal portion of his right lower extremity even into the region of his thigh. 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 He was placed on one as what at that point. Nonetheless he is done with that and has been tolerating it well otherwise. Doxycycline which in the past really did not seem to be effective for him. Nonetheless I think the best option may be for [place] to definitely reinitiate the antibiotics for a longer period of time. 07/07/18; since I  last saw this patient a week ago he has had a difficult time. At that point he did not have an open wound on his left leg. We transitioned him into juxta light stockings. He was apparently in the clinic the next day with blisters on the left lateral and left medial lower calf. He also had weeping edema fluid. He was put back into a compression wrap. He was also in the clinic on Friday with intense erythema in his right thigh. Per the patient he was started on Bactrim however that didn't work at all in terms of relieving his pain and swelling. He has taken 3 doxycycline that he had left over from last time and that seems to of helped. He has blistering on the right thigh as well. 07/14/18; the erythema on his right thigh has gotten better with doxycycline that he is finishing. 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 Presumably this cellulitis in the thigh was not related to that although I think this is a bit concerning going forward. He still has an area on the right lateral calf the blister on the right medial calf just below the knee that was discussed above. On the left 2 small open areas left medial and left lateral. Edema control is adequate. He is using his compression pumps twice a day 07/20/18; continued improvement in the condition of both legs especially the edema in his bilateral thighs. He tells me he is been losing weight through a combination of diet and exercise. He is using his compression pumps twice a day. So overall she made to the remaining wounds 07/27/2018; continued improvement in condition of both legs. His edema is well controlled. The area on the right lateral leg is just about closed he had one blisters show up on the medial left upper calf. We have him in 4 layer compression. He is going on a 10-day trip to [place], [place] and [place]. He will be driving. He wants to wear Unna boots because of  the lessening amount of constriction. He will not use compression pumps while he is away 08/05/18 on evaluation today patient actually appears to be doing decently well all things considered in regard to his bilateral lower extremities. The worst ulcer is actually only posterior aspect of his left lower  extremity with a four layer compression wrap cut into his leg a couple weeks back. He did have a trip and actually had [employer] for the trip that he is worn since he was last here. Nonetheless he feels like the [employer] actually do better for him his swelling is up a little bit but he also with his trip was not taking his Lasix on a regular set schedule like he was supposed to be. He states that obviously the reason being that he cannot drive and keep going without having to urinate too frequently which makes it difficult. He did not have his pumps with him while he was away either which I think also maybe playing a role here too. 08/13/2018; the patient only has a small open wound on the right lateral calf which is a big improvement in the last month or 2. He also has the area posteriorly just below the posterior fossa on the left which I think was a wrap injury from several weeks ago. He has no current evidence of cellulitis. He tells me he is back into his compression pumps twice a day. He also tells me that while he was at the laundromat somebody stole a section of his extremitease stockings 08/20/2018; back in the clinic with a much improved state. He only has small areas on the right lateral mid calf which is just about healed. This was is more substantial area for quite a prolonged period of time. He has a small open area on the left anterior tibia. The area on the posterior calf just below the popliteal fossa is closed today. He is using his compression pumps twice a day 08/28/2018; patient has no open wound on the right leg. He has a smattering of open areas on the calf with some  weeping lymphedema. More problematically than that it looks as though his wraps of slipped down in his usual he has very angry upper area of edema just below the right medial knee and on the right lateral calf. He has no open area on his feet. The patient is traveling to [facility] next week. I will send him in an antibiotic. We will continue to wrap the right leg. We ordered extremitease stockings for him last week and I plan to transition the right leg to a stocking when he gets home which will be in 10 days time. As usual he is very reluctant to take his pumps with him when he travels 09/07/2018; patient returns from [facility]. He shows me a picture of his left leg in the mid part of his trip last week with intense fire engine erythema. The picture look bad enough I would have considered sending him to the hospital. Instead he went to the wound care center in [facility]. They did not prescribe him antibiotics but he did take some doxycycline he had leftover from a previous visit. I had given him trimethoprim sulfamethoxazole before he left this did not work according to the patient. This is resulted in some improvement fortunately. He comes back with a large wound on the left posterior calf. Smaller area on the left anterior tibia. Denuded blisters on the dorsal left foot over his toes. Does not have much in the way of wounds on the right leg although he does have a very tender area on the right posterior area just below the popliteal fossa also suggestive of infection. He promises me he is back on his pumps twice a  day 09/15/2018; the intense cellulitis in his left lower calf is a lot better. The wound area on the posterior left calf is also so better. However he has reasonably extensive wounds on the dorsal aspect of his second and third toes and the proximal foot just at the base of the toes. There is nothing open on the right leg 09/22/2018; the patient has excellent edema  control in his legs bilaterally. He is using his external compression pumps twice a day. He has no open area on the right leg and only the areas in the left foot dorsally second and third toe area on the left side. He does not have any signs of active cellulitis. 10/06/2018; the patient has good edema control bilaterally. He has no open wound on the right leg. There is a blister in the posterior aspect of his left calf that we had to deal with today. He is using his compression pumps twice a day. There is no signs of active cellulitis. We have been using silver alginate to the wound areas. He still has vulnerable areas on the base of his left first second toes dorsally He has a his extremities stockings and we are going to transition him today into the stocking on the right leg. He is cautioned that he will need to continue to use the compression pumps twice a day. If he notices uncontrolled edema in the right leg he may need to go to 3 times a day. 10/13/2018; the patient came in for a nurse check on Friday he has a large flaccid blister on the right medial calf just below the knee. We unroofed this. He has this and a new area underneath the posterior mid calf which was undoubtedly a blister as well. He also has several small areas on the right which is the area we put his extremities stocking on. 10/19/2018; the patient went to see infectious disease this morning I am not sure if that was a routine follow-up in any case the doxycycline I had given him was discontinued and started on linezolid. He has not started this. It is easy to look at his left calf and the inflammation and think this is cellulitis however he is very tender in the tissue just below the popliteal fossa and I have no doubt that there is infection going on here. He states the problem he is having is that with the compression pumps the edema goes down and then starts walking the wrap falls down. We will see if we can adhere this. He  has 1 or 2 minuscule open areas on the right still areas that are weeping on the posterior left calf, the base of his left second and third toes 10/26/18; back today in clinic with quite of skin breakdown in his left anterior leg. This may have been infection the area below the popliteal fossa seems a lot better however tremendous epithelial loss on the left anterior mid tibia area over quite inexpensive tissue. He has 2 blisters on the right side but no other open wound here. 10/29/2018; came in urgently to see [place] today and we worked him in for review. He states that the 4 layer compression on the right leg caused pain he had to cut it down to roughly his mid calf this caused swelling above the wrap and he has blisters and skin breakdown today. As a result of the pain he has not been using his pumps. Both legs are a lot more edematous and there  is a lot of weeping fluid. 11/02/18; arrives in clinic with continued difficulties in the right leg> left. Leg is swollen and painful. multiple skin blisters and new open areas especially laterally. He has not been using his pumps on the right leg. 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 He is not systemically unwell. 11/09/2018; the patient claims he is being compliant with his pumps. He is finished the doxycycline I gave him last week. Culture I did of the wound on the right lateral leg showed a few very resistant methicillin staph aureus. This was resistant to doxycycline. Nevertheless he states the pain in the leg is a lot better which makes me wonder if the cultured organism was not really what was causing the problem nevertheless this is a very dangerous organism to be culturing out of any wound. His right leg is still a lot larger than the left. He is using an Radio broadcast assistant on this area, he blames a 4-layer compression for causing the original skin breakdown which I doubt is true however I cannot talk him out  of it. We have been using silver alginate to all of these areas which were initially blisters 11/16/2018; patient is being compliant with his external compression pumps at twice a day. Miraculously he arrives in clinic today with absolutely no open wounds. He has better edema control on the left where he has been using 4 layer compression versus wound of wounds on the right and I pointed this out to him. There is no inflammation in the skin in his lower legs which is also somewhat unusual for him. There is no open wounds on the dorsal left foot. He has extremitease stockings at home and I have asked him to bring these in next week. 11/25/18 patient's lower extremity on examination today on the left appears for the most part to be wound free. He does have an open wound on the lateral aspect of the right lower extremity but this is minimal compared to what I've seen in past. He does request that we go ahead and wrap the left leg as well even though there's nothing open just so hopefully it will not reopen in short order. 1/28; patient has superficial open wounds on the right lateral calf left anterior calf and left posterior calf. His edema control is adequate. 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 We have been using silver alginate as the primary dressing. He claims compliance with his compression pumps 2/4; patient has superficial open wounds on numerous areas of his left calf and again one on the left dorsal foot. The areas on the right lateral calf have healed. The cellulitis that I gave him doxycycline for last week is also resolved this was mostly on the left anterior calf just below the tibial tuberosity. His edema looks fairly well-controlled. He tells me he went to see his primary doctor today and had blood work ordered 2/11; once again he has several open areas on the left calf left tibial area. Most of  these are small and appear to have healthy granulation. He does not have anything open on the right. The edema and control in his thighs is pretty good which is usually a good indication he has been using his pumps as requested. 2/18; he continues to have several small areas on the left calf and left tibial area. Most of  these are small healthy granulation. We put him in his stocking on the right leg last week and he arrives with a superficial open area over the right upper tibia and a fairly large area on the right lateral tibia in similar condition. His edema control actually does not look too bad, he claims to be using his compression pumps twice a day 2/25. Continued small areas on the left calf and left tibial area. New areas especially on the right are identified just below the tibial tuberosity and on the right upper tibia itself. There are also areas of weeping edema fluid even without an obvious wound. He does not have a considerable degree of lymphedema but clearly there is more edema here than his skin can handle. He states he is using the pumps twice a day. We have an Unna boot on the right and 4 layer compression on the left. 3/3; he continues to have an area on the right lateral calf and right posterior calf just below the popliteal fossa. There is a fair amount of tenderness around the wound on the popliteal fossa but I did not see any evidence of cellulitis, could just be that the wrap came down and rubbed in this area. He does not have an open area on the left leg however there is an area on the left dorsal foot at the base of the third toe We have been using silver alginate to all wound areas 3/10; he did not have an open area on his left leg last time he was here a week ago. T oday he arrives with a horizontal wound just below the tibial tuberosity and an area on the left lateral calf. He has intense erythema and tenderness in this area. The area is on the right lateral calf and  right posterior calf better than last week. We have been using silver alginate as usual 3/18 - Patient returns with 3 small open areas on left calf, and 1 small open area on right calf, the skin looks ok with no significant erythema, he continues the UNA boot on right and 4 layer compression on left. The right lateral calf wound is closed , the right posterior is small area. we will continue silver alginate to the areas. 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 He is been tolerating the dressing changes without complication. Fortunately she has made excellent improvement in regard to the overall status of his wounds. Unfortunately every time we cease wrapping him he ends up reopening in causing more significant issues at that point. Again I'm unsure of the best direction to take although I think the lymphedema clinic may be appropriate for him. 02/03/19 on evaluation today patient appears to be doing well in regard to the wounds that we saw him for last week unfortunately he has a new area on the proximal portion of his right medial/posterior lower extremity where the wrap somewhat slowed down and caused swelling and a blister to rub and open. Unfortunately this is the only opening that he has on either leg at this point. 02/17/19 on evaluation today patient's bilateral lower extremities appear to be doing well. He still completely healed in regard to the left lower extremity. In regard to the right lower extremity the area where the wrap and slid down and caused the  blister still seems to be slightly open although this is dramatically better than during the last evaluation two weeks ago. I'm very pleased with the way this stands overall. 03/03/19 on evaluation today patient appears to be doing well in regard  to his right lower extremity in general although he did have a new blister open this does not appear to be showing any evidence of active infection at this time. Fortunately there's No fevers, chills, nausea, or vomiting noted at this time. Overall I feel like he is making good progress it does feel like that the right leg will we perform the [employer] seems to do with a bit better than three layer wrap on the left which slid down on him. We may switch to doing bilateral in the book wraps. 5/4; I have not seen Mr. Hotard in quite some time. According to our case manager he did not have an open wound on his left leg last week. He had 1 remaining wound on the right posterior medial calf. He arrives today with multiple openings on the left leg probably were blisters and/or wrap injuries from Unna boots. I do not think the Unna boot's will provide adequate compression on the left. I am also not clear about the frequency he is using the compression pumps. 03/17/19 on evaluation today patient appears to be doing excellent in regard to his lower extremities compared to last week's evaluation apparently. He had gotten significantly worse last week which is unfortunate. The [employer] wrap on the left did not seem to do very well for him at all and in fact it didn't control his swelling significantly enough he had an additional outbreak. Subsequently we go back to the four layer compression wrap on the left. This is good news. At least in that he is doing better and the wound seem to be killing him. He still has not heard anything from the lymphedema clinic. 03/24/19 on evaluation today patient actually appears to be doing much better in regard to his bilateral lower Trinity as compared to last week when I saw him. Fortunately there's no signs of active infection at this time. He has been tolerating the dressing changes without complication. Overall I'm extremely pleased with the progress and appearance in  general. 04/07/19 on evaluation today patient appears to be doing well in regard to his bilateral lower extremities. His swelling is significantly down from where it was previous. With that being said he does have a couple blisters still open at this point but fortunately nothing that seems to be too severe and again the majority of the larger openings has healed at this time. 04/14/19 on evaluation today patient actually appears to be doing quite well in regard to his bilateral lower extremities in fact I'm not even sure there's anything significantly open at this time at any site. Nonetheless he did have some trouble with these wraps where they are somewhat irritating him secondary to the fact that he has noted that the graph wasn't too close down to the end of this foot in a little bit short as well up to his knee. Otherwise things seem to be doing quite well. 04/21/19 upon evaluation today patient's wound bed actually showed evidence of being completely healed in regard to both lower extremities which is excellent news. There does not appear to be any signs of active infection which is also good news. I'm very pleased in this regard. No fevers, chills, nausea, or vomiting noted at this time.  04/28/19 on evaluation today patient appears to be doing a little bit worse in regard to both lower extremities on the left mainly due to the fact that when he went infection disease the wrap was not wrapped quite high enough he developed a blister above this. On the right he is a small open area of nothing too significant but again this is continuing to give him some trouble he has been were in the Velcro compression that he has at home. 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 He's been tolerating the dressing changes without complication. Fortunately there's no signs of active infection at this time. No fevers, chills, nausea, or vomiting noted at  this time. We have been trying to get an appointment with her lymphedema clinic in [facility] but unfortunately nobody can get them on phone with not been able to even fax information over the patient likewise is not been able to get in touch with them. Overall I'm not sure exactly what's going on here with to reach out again today. 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 Still having a lot of drainage unfortunately. He tells me especially in the left but even on the right. There's no signs of active infection which is good news we've been using so ratcheted up to this point. 05/19/19 on evaluation today patient actually appears to be doing quite well with regard to his left lower extremity which is great news. Fortunately in regard to the right lower extremity has an issues with his wrap and he subsequently did remove this from what I'm understanding. Nonetheless long story short is what he had rewrapped once he removed it subsequently had maggots underneath this wrap whenever he came in for evaluation today. With that being said they were obviously completely cleaned away by the nursing staff. The visit today which is excellent news. However he does appear to potentially have some infection around the right ankle region where the maggots were located as well. He will likely require anabiotic therapy today. 05/26/19 on evaluation today patient actually appears to be doing much better in regard to his bilateral lower extremities. I feel like the infection is under much better control. With that being said there were maggots noted when the wrap was removed yet again today. Again this could have potentially been left over from previous although at this time there does not appear to be any signs of significant drainage there was obviously on the wrap some drainage as well this contracted gnats or  otherwise. Either way I do not see anything that appears to be doing worse in my pinion and in fact I think his drainage has slowed down quite significantly likely mainly due to the fact to his infection being under better control. 06/02/2019 on evaluation today patient actually appears to be doing well with regard to his bilateral lower extremities there is no signs of active infection at this time which is great news. With that being said he does have several open areas more so on the right than the left but nonetheless these are all significantly better than previously noted. 06/09/2019 on evaluation today patient actually appears to be doing well. His wrap stayed up and he did not cause any problems he had more drainage on the right compared to the left but overall I do not see any major issues  at this time which is great news. 06/16/2019 on evaluation today patient appears to be doing excellent with regard to his lower extremities the only area that is open is a new blister that can have opened as of today on the medial ankle on the left. Other than this he really seems to be doing great I see no major issues at this point. 06/23/2019 on evaluation today patient appears to be doing quite well with regard to his bilateral lower extremities. In fact he actually appears to be almost completely healed there is a small area of weeping noted of the right lower extremity just above the ankle. Nonetheless fortunately there is no signs of active infection at this time which is good news. No fevers, chills, nausea, vomiting, or diarrhea. 8/24; the patient arrived for a nurse visit today but complained of very significant pain in the left leg and therefore I was asked to look at this. Noted that he did not have an open area on the left leg last week nevertheless this was wrapped. The patient states that he is not been able to put his compression pumps on the left leg because of the discomfort. He has not been  systemically unwell 06/30/2019 on evaluation today patient unfortunately despite being excellent last week is doing much worse with regard to his left lower extremity today. In fact he had to come in for a nurse on Monday where his left leg had to be rewrapped due to excessive weeping Dr. Leanord Hawking placed him on doxycycline at that point. Fortunately there is no signs of active infection Systemically at this time which is good news. 07/07/2019 in regard to the patient's wounds today he actually seems to be doing well with his right lower extremity there really is nothing open or draining at this point this is great news. Unfortunately the left lower extremity is given him additional trouble at this time. There does not appear to be any signs of active infection nonetheless he does have a lot of edema and swelling noted at this point as well as blistering all of which has led to a much more poor appearing leg at this time compared to where it was 2 weeks ago when it was almost completely healed. Obviously this is a little discouraging for the patient. He is try to contact the lymphedema clinic in [place] he has not been able to get through to them. 07/14/2019 on evaluation today patient actually appears to be doing slightly better with regard to his left lower extremity ulcers. Overall I do feel like at least at the top of the wrap that we have been placing this area has healed quite nicely and looks much better. The remainder of the leg is showing signs of improvement. Unfortunately in the thigh area he still has an open region on the left and again on the right he has been utilizing just a Band-Aid on an area that also opened on the thigh. Again this is an area that were not able to wrap although we did do an Ace wrap to provide some compression that something that obviously is a little less effective than the compression wraps we have been using on the lower portion of the leg. He does have an  appointment with the lymphedema clinic in [facility] on Friday. 07/21/2019 on evaluation today patient appears to be doing better with regard to his lower extremity ulcers. He has been tolerating the dressing changes without complication. Fortunately there is no signs of  active infection at this time. No fevers, chills, nausea, vomiting, or diarrhea. I did receive the paperwork from the physical therapist at the lymphedema clinic in [place]. Subsequently I signed off on that this morning and sent that back to him for further progression with the treatment plan. 07/28/2019 on evaluation today patient appears to be doing very well with regard to his right lower extremity where I do not see any open wounds at this point. Fortunately he is feeling great as far as that is concerned as well. In regard to the left lower extremity he has been having issues with still several areas of weeping and edema although the upper leg is doing better his lower leg still I think is going require the compression wrap at this time. No fevers, chills, nausea, vomiting, or diarrhea. 08/04/2019 on evaluation today patient unfortunately is having new wounds on the right lower extremity. Again we have been using Unna boot wrap on that side. We switched him to using his juxta lite wrap at home. With that being said he tells me he has been using it although his legs extremely swollen and to be honest really does not appear that he has been. I cannot know that for sure however. Nonetheless he has multiple new wounds on the right lower extremity at this time. Obviously we will have to see about getting this rewrapped for him today. 08/11/2019 on evaluation today patient appears to be doing fairly well with regard to his wounds. He has been tolerating the dressing changes including the compression wraps without complication. He still has a lot of edema in his upper thigh regions bilaterally he is supposed to be seeing the  lymphedema clinic on the 15th of this month once his wraps arrive for the upper part of his legs. 08/18/2019 on evaluation today patient appears to be doing well with regard to his bilateral lower extremities at this point. He has been tolerating the dressing changes without complication. Fortunately there is no signs of active infection which is also good news. He does have a couple weeping areas on the first and second toe of the right foot he also has just a small area on the left foot upper leg and a small area on the left lower leg but overall he is doing quite well in my opinion. He is supposed to be getting his wraps shortly in fact tomorrow and then subsequently is seeing the lymphedema clinic next Wednesday on the 21st. Of note he is also leaving on the 25th to go on vacation for a week to the beach. For that reason and since there is some uncertainty about what there can be doing at lymphedema clinic next Wednesday I am get a make an appointment for next Friday here for [place] to see what we need to do for him prior to him leaving for vacation. 10/23; patient arrives in considerable pain predominantly in the upper posterior calf just distal to the popliteal fossa also in the wound anteriorly above the major wound. This is probably cellulitis and he has had this recurrently in the past. He has no open wound on the right side and he has had an Radio broadcast assistant in that area. Finally I note that he has an area on the left posterior calf which by enlarge is mostly epithelialized. This protrudes beyond the borders of the surrounding skin in the setting of dry scaly skin and lymphedema. The patient is leaving for [facility] on Sunday. Per his longstanding pattern,  he will not take his compression pumps with him predominantly out of fear that they will be stolen. He therefore asked that we put a Unna boot back on the right leg. He will also contact the wound care center in [facility] to see if they can  change his dressing in the mid week. 11/3; patient returned from his vacation to [facility]. He was seen on 1 occasion at their wound care center. They did a 2 layer compression system as they did not have our 4-layer wrap. I am not completely certain what they put on the wounds. They did not change the Unna boot on the right. The patient is also seeing a lymphedema specialist physical therapist in [place]. It appears that he has some compression sleeve for his thighs which indeed look quite a bit better than I am used to seeing. He pumps over these with his external compression pumps. 11/10; the patient has a new wound on the right medial thigh otherwise there is no open areas on the right. He has an area on the left leg posteriorly anteriorly and medially and an area over the left second toe. We have been using silver alginate. He thinks the injury on his thigh is secondary to friction from the compression sleeve he has. 11/17; the patient has a new wound on the right medial thigh last week. He thinks this is because he did not have a underlying stocking for his thigh juxta lite apparatus. He now has this. The area is fairly large and somewhat angry but I do not think he has underlying cellulitis. He has a intact blister on the right anterior tibial area. Small wound on the right great toe dorsally Small area on the medial left calf. 11/30; the patient does not have any open areas on his right leg and we did not take his juxta lite stocking off. However he states that on Friday his compression wrap fell down lodging around his upper mid calf area. As usual this creates a lot of problems for him. He called urgently today to be seen for a nurse visit however the nurse visit turned into a provider visit because of extreme erythema and pain in the left anterior tibia extending laterally and posteriorly. The area that is problematic is extensive 10/06/2019 upon evaluation today patient actually  appears to be doing poorly in regard to his left lower extremity. He Dr. Leanord Hawking did place him on doxycycline this past Monday apparently due to the fact that he was doing much worse in regard to this left leg. Fortunately the doxycycline does seem to be helping. Unfortunately we are still having a very difficult time getting his edema under any type of control in order to anticipate discharge at some point. The only way were really able to control his lymphedema really is with compression wraps and that has only even seemingly temporary. He has been seeing a lymphedema clinic they are trying to help in this regard but still this has been somewhat frustrating in general for the patient. 10/13/19 on evaluation today patient appears to be doing excellent with regard to his right lower extremity as far as the wounds are concerned. His swelling is still quite extensive unfortunately. He is still having a lot of drainage from the thigh areas bilaterally which is unfortunate. He's been going to lymphedema clinic but again he still really does not have this edema under control as far as his lower extremities are concern. With regard to his left  lower extremity this seems to be improving and I do believe the doxycycline has been of benefit for him. He is about to complete the doxycycline. 10/20/2019 on evaluation today patient appears to be doing poorly in regard to his bilateral lower extremities. More in the right thigh he has a lot of irritation at this site unfortunately. In regard to the left lower extremity the wrap was not quite as high it appears and does seem to have caused him some trouble as well. Fortunately there is no evidence of systemic infection though he does have some blue-green drainage which has me concerned for the possibility of Pseudomonas. He tells me he is previously taking Cipro without complications and he really does not care for Levaquin however due to some of the side effects he  has. He is not allergic to any medications specifically antibiotics that were aware of. 10/27/2019 on evaluation today patient actually does appear to be for the most part doing better when compared to last week's evaluation. With that being said he still has multiple open wounds over the bilateral lower extremities. He actually forgot to start taking the Cipro and states that he still has the whole bottle. He does have several new blisters on left lower extremity today I think I would recommend he go ahead and take the Cipro based on what I am seeing at this point. 12/30-Patient comes at 1 week visit, 4 layer compression wraps on the left and Unna boot on the right, primary dressing Xtrasorb and silver alginate. Patient is taking his Cipro and has a few more days left probably 5-6, and the legs are doing better. He states he is using his compressions devices which I believe he has 11/10/2019 on evaluation today patient actually appears to be much better than last time I saw him 2 weeks ago. His wounds are significantly improved and overall I am very pleased in this regard. Fortunately there is no signs of active infection at this time. He is just a couple of days away from completing Cipro. Overall his edema is much better he has been using his lymphedema pumps which I think is also helping at this point. 11/17/2019 on evaluation today patient appears to be doing excellent in regard to his wounds in general. His legs are swollen but not nearly as much as they have been in the past. Fortunately he is tolerating the compression wraps without complication. No fevers, chills, nausea, vomiting, or diarrhea. He does have some erythema however in the distal portion of his right lower extremity specifically around the forefoot and toes there is a little bit of warmth here as well. 11/24/2019 on evaluation today patient appears to be doing well with regard to his right lower extremity I really do not see any open  wounds at this point. His left lower extremity does have several open areas and his right medial thigh also is open. Other than this however overall the patient seems to be making good progress and I am very pleased at this point. 12/01/2019 on evaluation today patient appears to be doing poorly at this point in regard to his left lower extremity has several new blisters despite the fact that we have him in compression wraps. In fact he had a 4-layer compression wrap, his upper thigh wrapped from lymphedema clinic, and a juxta light over top of the 4 layer compression wrap the lymphedema clinic applied and despite all this he still develop blisters underneath. Obviously this does have me concerned  about the fact that unfortunately despite what we are doing to try to get wounds healed he continues to have new areas arise I do not think he is ever good to be at the point where he can realistically just use wraps at home to keep things under control. Typically when we heal him it takes about 1-2 days before he is back in the clinic with severe breakdown and blistering of his lower extremities bilaterally. This is happened numerous times in the past. Unfortunately I think that we may need some help as far as overall fluid overload to kind of limit what we are seeing and get things under better control. 12/08/2019 on evaluation today patient presents for follow-up concerning his ongoing bilateral lower extremity edema. Unfortunately he is still having quite a bit of swelling the compression wraps are controlling this to some degree but he did see Dr. [name] his cardiologist I do have that available for review today as far as the appointment was concerned that was on 12/06/2019. Obviously that she has been 2 days ago. The patient states that he is only been taking the Lasix 80 mg 1 time a day he had told me previously he was taking this twice a day. Nonetheless Dr. [name] recommended this be up to 80 mg 2 times a  day for the patient as he did appear to be fluid overloaded. With that being said the patient states he did this yesterday and he was unable to go anywhere or do anything due to the fact that he was constantly having to urinate. Nonetheless I think that this is still good to be something that is important for him as far as trying to get his edema under control at all things that he is going to be able to just expect his wounds to get under control and things to be better without going through at least a period of time where he is trying to stabilize his fluid management in general and I think increasing the Lasix is likely the first step here. It was also mentioned the possibility that the patient may require metolazone. With that being said he wanted to have the patient take Lasix twice a day first and then reevaluating 2 months to see where things stand. 12/15/2019 upon evaluation today patient appears to be doing regard to his legs although his toes are showing some signs of weeping especially on the left at this point to some degree on the right. There does not appear to be any signs of active infection and overall I do feel like the compression wraps are doing well for him but he has not been able to take the Lasix at home and the increased dose that Dr. [name] recommended. He tells me that just not go to be feasible for him. Nonetheless I think in this case he should probably send a message to Dr. [name] in order to discuss options from the standpoint of possible admission to get the fluid off or otherwise going forward. 12/22/2019 upon evaluation today patient appears to be doing fairly well with regard to his lower extremities at this point. In fact he would be doing excellent if it was not for the fact that his right anterior thigh apparently had an allergic reaction to adhesive tape that he used. The wound itself that we have been monitoring actually appears to be healed. There is a lot of  irritation at this point. 12/29/2019 upon evaluation today patient appears to be doing well  in regard to his lower extremities. His left medial thigh is open and somewhat draining today but this is the only region that is open the right has done much better with the treatment utilizing the steroid cream that I prescribed for him last week. Overall I am pleased in that regard. Fortunately there is no signs of active infection at this time. No fevers, chills, nausea, vomiting, or diarrhea. 01/05/2020 upon evaluation today patient appears to be doing more poorly in regard to his right lower extremity at this point upon evaluation today. Unfortunately he continues to have issues in this regard and I think the biggest issue is controlling his edema. This obviously is not very well controlled at this point is been recommended that he use the Lasix twice a day but he has not been able to do that. Unfortunately I think this is leading to an issue where honestly he is not really able to effectively control his edema and therefore the wounds really are not doing significantly better. I do not think that he is going to be able to keep things under good control unless he is able to control his edema much better. I discussed this again in great detail with him today. 01/12/2020 good news is patient actually appears to be doing quite well today at this point. He does have an appointment with lymphedema clinic tomorrow. His legs appear healed and the toe on the left is almost completely healed. In general I am very pleased with how things stand at this point. 01/19/2020 upon evaluation today patient appears to actually be doing well in regard to his lower extremities there is nothing open at this point. Fortunately he has done extremely well more recently. Has been seeing lymphedema clinic as well. With that being said he has Velcro wraps for his lower legs as well as his upper legs. The only wound really is on his toe  which is the right great toe and this is barely anything even there. With all that being said I think it is good to be appropriate today to go ahead and switch him over to the Velcro compression wraps. 01/26/2020 upon evaluation today patient appears to be doing worse with regard to his lower extremities after last week switch him to Velcro compression wraps. Unfortunately he lasted less than 24 hours he did not have the sock portion of his Velcro wrap on the left leg and subsequently developed a blister underneath the Velcro portion. Obviously this is not good and not what we were looking for at this point. He states the lymphedema clinic did tell him to wear the wrap for 23 hours and take him off for 1 I am okay with that plan but again right now we got a get things back under control again he may have some cellulitis noted as well. 02/02/2020 upon evaluation today patient unfortunately appears to have several areas of blistering on his bilateral lower extremities today mainly on the feet. His legs do seem to be doing somewhat better which is good news. Fortunately there is no evidence of active infection at this time. No fevers, chills, nausea, vomiting, or diarrhea. 02/16/2020 upon evaluation today patient appears to be doing well at this time with regard to his legs. He has a couple weeping areas on his toes but for the most part everything is doing better and does appear to be sealed up on his legs which is excellent news. We can continue with wrapping him at this point  as he had every time we discontinue the wraps he just breaks out with new wounds. There is really no point in is going forward with this at this point. 03/08/2020 upon evaluation today patient actually appears to be doing quite well with regard to his lower extremity ulcers. He has just a very superficial and really almost nonexistent blister on the left lower extremity he has in general done very well with the compression wraps. With  that being said I do not see any signs of infection at this time which is good news. 03/29/2020 upon evaluation today patient appears to be doing well with regard to his wounds currently except for where he had several new areas that opened up due to some of the wrap slipping and causing him trouble. He states he did not realize they had slipped. Nonetheless he has a 1 area on the right and 3 new areas on the left. Fortunately there is no signs of active infection at this time which is great news. 04/05/2020 upon evaluation today patient actually appears to be doing quite well in general in regard to his legs currently. Fortunately there is no signs of active infection at this time. No fevers, chills, nausea, vomiting, or diarrhea. He tells me next week that he will actually be seen in the lymphedema clinic on Thursday at 10 AM I see him on Wednesday next week. 04/12/2020 upon evaluation today patient appears to be doing very well with regard to his lower extremities bilaterally. In fact he does not appear to have any open wounds at this point which is good news. Fortunately there is no signs of active infection at this time. No fevers, chills, nausea, vomiting, or diarrhea. 04/19/2020 upon evaluation today patient appears to be doing well with regard to his wounds currently on the bilateral lower extremities. There does not appear to be any signs of active infection at this time. Fortunately there is no evidence of systemic infection and overall very pleased at this point. Nonetheless after I held him out last week he literally had blisters the next morning already which swelled up with him being right back here in the clinic. Overall I think that he is just not can be able to be discharged with his legs the way they are he is much to volume overloaded as far as fluid is concerned and that was discussed with him today of also discussed this but should try the clinic nurse manager as well as Dr.  Leanord Hawkingobson. 04/26/2020 upon evaluation today patient appears to be doing better with regard to his wounds currently. He is making some progress and overall swelling is under good control with the compression wraps. Fortunately there is no evidence of active infection at this time. 05/10/2020 on evaluation today patient appears to be doing overall well in regard to his lower extremities bilaterally. He is Tolerating the compression wraps without complication and with what we are seeing currently I feel like that he is making excellent progress. There is no signs of active infection at this time. 05/24/2020 upon evaluation today patient appears to be doing well in regard to his legs. The swelling is actually quite a bit down compared to where it has been in the past. Fortunately there is no sign of active infection at this time which is also good news. With that being said he does have several wounds on his toes that have opened up at this point. 05/31/2020 upon evaluation today patient appears to be doing well  with regard to his legs bilaterally where he really has no significant fluid buildup at this point overall he seems to be doing quite well. Very pleased in this regard. With regard to his toes these also seem to be drying up which is excellent. We have continue to wrap him as every time we tried as a transition to the juxta light wraps things just do not seem to get any better. 06/07/2020 upon evaluation today patient appears to be doing well with regard to his right leg at this point. Unfortunately left leg has a lot of blistering he tells me the wrap started to slide down on him when he tried to put his other Velcro wrap over top of it to help keep things in order but nonetheless still had some issues. 06/14/2020 on evaluation today patient appears to be doing well with regard to his lower extremity ulcers and foot ulcers at this point. I feel like everything is actually showing signs of improvement which  is great news overall there is no signs of active infection at this time. No fevers, chills, nausea, vomiting, or diarrhea. 06/21/2020 on evaluation today patient actually appears to be doing okay in regard to his wounds in general. With that being said the biggest issue I see is on his right foot in particular the first and second toe seem to be doing a little worse due to the fact this is staying very wet. I think he is probably getting need to change out his dressings a couple times in between each week when we see him in regard to his toes in order to keep this drier based on the location and how this is proceeding. 06/28/2020 on evaluation today patient appears to be doing a little bit more poorly overall in regard to the appearance of the skin I am actually somewhat concerned about the possibility of him having a little bit of an infection here. We discussed the course of potentially giving him a doxycycline prescription which he is taken previously with good result. With that being said I do believe that this is potentially mild and at this point easily fixed. I just do not want anything to get any worse. 07/12/2020 upon evaluation today patient actually appears to be making some progress with regard to his legs which is great news there does not appear to be any evidence of active infection. Overall very pleased with where things stand. 07/26/2020 upon evaluation today patient appears to be doing well with regard to his leg ulcers and toe ulcers at this point. He has been tolerating the compression wraps without complication overall very pleased in this regard. 08/09/2020 upon evaluation today patient appears to be doing well with regard to his lower extremities bilaterally. Fortunately there is no signs of active infection overall I am pleased with where things stand. 08/23/2020 on evaluation today patient appears to be doing well with regard to his wound. He has been tolerating the dressing  changes without complication. Fortunately there is no signs of active infection at this time. Overall his legs seem to be doing quite well which is great news and I am very pleased in that regard. No fevers, chills, nausea, vomiting, or diarrhea. 09/13/2020 upon evaluation today patient appears to be doing okay in regard to his lower extremities. He does have a fairly large blister on the right leg which I did remove the blister tissue from today so we can get this to dry out other than that however he  seems to be doing quite well. There is no signs of active infection at this time. 09/27/2020 upon evaluation today patient appears to actually be doing some better in regard to his right leg. Fortunately signs of active infection at this time which is great news. No fevers, chills, nausea, vomiting, or diarrhea. 10/04/2020 upon evaluation today patient actually appears to be showing signs of improvement which is great news with regard to his leg ulcers. Fortunately there is no signs of active infection which is great news he is still taking the antibiotics currently. No fevers, chills, nausea, vomiting, or diarrhea. 10/18/2020 on evaluation today patient appears to be doing well with regard to his legs currently. He has been tolerating the dressing changes including the wraps without complication. Fortunately there is no signs of active infection at this time. No fevers, chills, nausea, vomiting, or diarrhea. 10/25/2020 upon evaluation today patient appears to be doing decently well in regard to his wounds currently. He has been tolerating the dressing changes without complication. Overall I feel like he is making good progress albeit slow. Again this is something we can have to continue to wrap for some time to come most likely. 11/08/2020 upon evaluation today patient appears to be doing well with regard to his wounds currently. He has been tolerating the dressing changes without complication is not  currently on any antibiotics and he does not appear to show any signs of infection. He does continue to have a lot of drainage on the right leg not too severe but nonetheless this is very scattered. On the left leg this is looking to be much improved overall. 11/15/2020 upon evaluation today patient appears to be doing better with regard to his legs bilaterally. Especially the right leg which was much more significant last week. There does not appear to be any signs of active infection which is great news. No fevers, chills, nausea, vomiting, or diarrhea. 11/23/2019 upon evaluation today patient appears to be doing poorly still in regard to his lower extremities bilaterally. Unfortunately his right leg in particular appears to be doing much more poorly there is no signs really of infection this is not warm to touch but he does have a lot of drainage and weeping unfortunately. With that reason I do believe that we may need to initiate some treatment here to try to help calm down some of the swelling of the right leg. I think switching to a 4-layer compression wrap would be beneficial here. The patient is in agreement with giving this a try. 11/29/2020 upon evaluation today patient appears to be doing well currently in regard to his leg ulcers. I feel like the right leg is doing better he still has a lot of drainage but we do see some improvement here. The 4-layer compression wrap I think was helpful. 12/06/2020 upon evaluation today patient appears to be doing well with regard to his legs. In fact they seem to be doing about the best I have seen up to this point. Fortunately there is no signs of active infection at this time. No fevers, chills, nausea, vomiting, or diarrhea. 12/20/2020 upon evaluation today patient appears to be doing well at this time in regard to his legs. He is not having any significant draining which is great news. Fortunately there is no signs of active infection at this time. No fevers,  chills, nausea, vomiting, or diarrhea. 01/17/2021 upon evaluation today [place] actually appears to be doing excellent in regard to his legs. He has a  few areas again that come and go as far as his toes are concerned but overall this is doing quite well. 01/31/2021 upon evaluation today patient appears to be doing well with regard to his legs. Fortunately there does not appear to be any signs of active infection which is great news. Overall he is still having significant edema despite the compression wraps basically the 4-layer compression wrap to just keep things under control there is really not much room for play. 4/13: Mr. Shular is a longstanding patient in our clinic and benefits greatly from weekly compression wraps. Today he has no complaints. He has been tolerating the wraps well. He states he is using the lymphedema pumps at home. 5/4; patient presents for follow-up of his chronic lymphedema/venous insufficiency ulcers. He comes weekly for compression wraps. He has no complaints today. He was unable to tolerate the Coflex 2 layer Last week so we will do the four press 4-layer compression. He has been using his lymphedema pumps daily. 5/18; patient presents for 2-week follow-up. He has no complaints or issues today. He has developed a new wound to the right foot on his fourth toe. He overall feels well and denies signs of infection. 6/1; patient presents for 2-week follow-up. He has no complaints or issues today. He denies signs of infection. 04/18/2021 upon evaluation today patient appears to be doing well with regard to his legs bilaterally. Family open wound is actually on the toe of his left foot everything else is completely closed which is great news. In general I am extremely pleased with where things stand at this point. The patient is also happy that things are doing so well. 05/02/2021 upon evaluation today patient's legs actually appear to be doing quite well today. Fortunately there  does not appear to be any signs of active infection which is great and overall I am extremely pleased with where he stands today. The patient does not appear to have any evidence of active infection at this time which is also great news. 05/09/2021 upon evaluation today patient appears to be doing a little bit more poorly in regard to his legs. Unfortunately he is having issues with some breakdown and a blood blister on the left leg this is due to I believe honestly to how it was wrapped last week. Fortunately there does not appear to be any signs of infection but nonetheless this is still a concern to be honest. No fevers, chills, nausea, vomiting, or diarrhea. 05/16/2021 upon evaluation today patient appears to be doing significantly better as compared to last week. I am very pleased with where things stand today. There does not appear to be any signs of infection which is great news and overall very pleased with where we stand. No fevers, chills, nausea, vomiting, or diarrhea. 05/30/2021 upon evaluation today patient appears to be doing well with regard to his legs. He has been tolerating the dressing changes without complication. Fortunately there does not appear to be any signs of active infection which is great news and overall I am extremely pleased with where things stand today. No fevers, chills, nausea, vomiting, or diarrhea. 06/20/2021 upon evaluation today patient actually appears to be making good progress today and very pleased with what we are seeing. I think his legs are really maintaining. As long as we continue wrapping he seems to be doing excellent in my opinion. Fortunately there is no signs of active infection at this time. No fevers, chills, nausea, vomiting, or diarrhea. 07/11/2021 upon evaluation  today patient actually appears to be making excellent progress at this time. Fortunately there does not appear to be any evidence of active infection which is great news and overall I am  extremely pleased with where things stand today. No fevers, chills, nausea, vomiting, or diarrhea. 07/25/2021 upon evaluation today patient appears to be doing well currently in regard to his lower extremities. He has been making good progress here and I do not see anything that is actually open significantly today this is great news. No fevers, chills, nausea, vomiting, or diarrhea. 08/08/2021 upon evaluation today patient appears to be doing well with regard to his wound. He has been tolerating the dressing changes without complication. With that being said unfortunately has a new area that opened up as far as his right posterior leg is concerned this was a blister he also has an area on the third toe right foot which also reopen. Fortunately there is no signs of active infection at this time which is great news. No fevers, chills, nausea, vomiting, or diarrhea. 10/17; patient came in today at his request initially for a nurse visit because it but out of concern for deterioration in both his lower legs and cellulitis I was asked to look at him. He comes in with increased swelling which he says started over the weekend he started to notice pain as well in his left medial ankle, right knee, left knee left dorsal foot. His wraps fell down contributing to some of this but he has not been using his compression pumps over the weekend for reasons that are not really clear. He comes in with multiple new wounds including the right posterior leg, right third toe, right fourth toe, left second toe left medial ankle left dorsal ankle and right anterior lower leg 09/19/2021 upon evaluation today patient does seem to be making improvements in general which is great news. I do not see any evidence of infection currently he does have some hypergranulation of the anterior portion of his ankle on the left side this is going require some debridement to pare this down and then subsequently silver nitrate probably due to  the amount of bleeding that he is probably going to experience. He is in agreement with this plan however. 09/26/2021 upon evaluation today although the patient's legs appear to be doing okay today unfortunately he did have maggots noted during the evaluation as well. This is again quite unfortunate with the respect to the fact that he feels like that he got the wrap wet which was noted today he is not sure when and I think this is what led to the issue. Nonetheless we can try to see what we can do about silly things off so that this does not happen again in the future although he is can have to be diligent about taking care of his wraps as well. 10/03/2021 upon evaluation today patient appears to be doing okay in regard to his legs currently. He unfortunately has maggots again noted on the left foot. This obviously is becoming quite an issue to be perfectly honest. I do think that he needs to see what can be done at home to try to limit this exposure. Last week we had cleaned everything away so this is a new infestation as far as that is concerned. There is really not much that I can do to combat that I am trying to do what we can here in the clinic to wrap his foot and try to prevent any  access of that again with knots there is a small this becomes very difficult. Electronic Signature(s) Signed: 10/03/2021 5:27:16 PM By: Lenda Kelp PA-C Entered By: Lenda Kelp on 10/03/2021 17:27:15 -------------------------------------------------------------------------------- Physical Exam Details Patient Name: Date of Service: [name], Jago J. 10/03/2021 10:30 A M Medical Record Number: [id number] Patient Account Number: [id number] Date of Birth/Sex: Treating RN: 02-12-1951 (70 y.o. [name] Primary Care Provider: Nicoletta Ba Other Clinician: Referring Provider: Treating Provider/Extender: Adele Dan in Treatment: 297 Constitutional Obese and  well-hydrated in no acute distress. Respiratory normal breathing without difficulty. Psychiatric this patient is able to make decisions and demonstrates good insight into disease process. Alert and Oriented x 3. pleasant and cooperative. Notes Upon inspection patient's wounds again showed signs for the most part of improvement which is good news I do not see any evidence of active infection also good news. There is no evidence of active infection at this time also good news. He is also very stressed due to the fact that he has a court date on Monday trying to be able to stay in his home a bit longer until he can actually get into the new apartment. Electronic Signature(s) Signed: 10/03/2021 5:27:48 PM By: Lenda Kelp PA-C Entered By: Lenda Kelp on 10/03/2021 17:27:48 -------------------------------------------------------------------------------- Physician Orders Details Patient Name: Date of Service: [name], Kavari J. 10/03/2021 10:30 A M Medical Record Number: [id number] Patient Account Number: [id number] Date of Birth/Sex: Treating RN: [birth year] (70 y.o. [name], [name] Primary Care Provider: Nicoletta Ba Other Clinician: Referring Provider: Treating Provider/Extender: Adele Dan in Treatment: [phone] Verbal / Phone Orders: No Diagnosis Coding ICD-10 Coding Code Description L97.521 Non-pressure chronic ulcer of other 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 of skin L97.829 Non-pressure chronic ulcer of other part of left lower leg with unspecified severity L97.519 Non-pressure chronic ulcer of other part of right foot with unspecified severity L97.812 Non-pressure chronic ulcer of other part of right lower leg with fat layer exposed I87.333 Chronic venous hypertension (idiopathic) with ulcer and inflammation of bilateral lower extremity L03.116 Cellulitis of left lower limb I89.0 Lymphedema, not elsewhere classified 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 Leonard Schwartz Wednesday Return A Other: - double the diuretic daily for the week. Bathing/ Shower/ Hygiene May shower with protection but do not get wound dressing(s) wet. Edema Control - Lymphedema / SCD / Other Bilateral Lower Extremities Lymphedema Pumps. Use Lymphedema pumps on leg(s) 2-3 times a day for 45-60 minutes. If wearing any wraps or hose, do not remove them. Continue exercising as instructed. Elevate legs to the level of the heart or above for 30 minutes daily and/or when sitting, a frequency of: - throughout the day Avoid standing for long periods of time. Exercise regularly Other Edema Control Orders/Instructions: - right leg 4 layer compression with unna boot to secure to upper portion of lower leg. apply lotion to right leg. Wound Treatment Wound #199 - T Second oe Wound Laterality: Right Cleanser: Soap and Water 1 x Per Week Discharge Instructions: May shower and wash wound with dial antibacterial soap and water prior to dressing change. Cleanser: Wound Cleanser 1 x Per Week Discharge Instructions: Cleanse the wound with wound cleanser prior to applying a clean dressing using gauze sponges, not tissue or cotton balls. Peri-Wound Care: Zinc Oxide  Ointment 30g tube 1 x Per Week Discharge Instructions: Apply Zinc Oxide to periwound with each dressing change Peri-Wound Care: Sween Lotion (Moisturizing lotion) 1 x Per Week Discharge Instructions: Apply moisturizing lotion as directed Prim Dressing: KerraCel Ag Gelling Fiber Dressing, 4x5 in (silver alginate) 1 x Per Week ary Discharge Instructions: Apply silver alginate to wound bed as instructed Secured With: Coban Self-Adherent Wrap 4x5 (in/yd) 1 x Per Week Discharge Instructions: Secure with Coban apply lightly as directed. Secured With: Teacher, early years/pre, Sterile 2x75 (in/in) 1 x Per Week Discharge Instructions: Secure with stretch gauze as directed. Wound #203 - Ankle Wound Laterality: Left, Anterior Cleanser: Soap and Water 1 x Per Week Discharge Instructions: May shower and wash wound with dial antibacterial soap and water prior to dressing change. Cleanser: Wound Cleanser 1 x Per Week Discharge Instructions: Cleanse the wound with wound cleanser prior to applying a clean dressing using gauze sponges, not tissue or cotton balls. Peri-Wound Care: Zinc Oxide Ointment 30g tube 1 x Per Week Discharge Instructions: Apply Zinc Oxide to periwound with each dressing change Peri-Wound Care: Sween Lotion (Moisturizing lotion) 1 x Per Week Discharge Instructions: Apply moisturizing lotion as directed Prim Dressing: KerraCel Ag Gelling Fiber Dressing, 4x5 in (silver alginate) 1 x Per Week ary Discharge Instructions: Apply silver alginate to wound bed as instructed Secondary Dressing: ABD Pad, 8x10 1 x Per Week Discharge Instructions: Apply over primary dressing as directed. Compression Wrap: FourPress (4 layer compression wrap) 1 x Per Week Discharge Instructions: ****UNNA BOOT FIRST LAYER APPLIED TO UPPER PORTION OF LOWER LEG.*** Wound #204 - T Second oe Wound Laterality: Left Cleanser: Soap and Water 1 x Per Week Discharge Instructions: May shower and wash wound with dial antibacterial soap and water prior to dressing change. Cleanser: Wound Cleanser 1 x Per Week Discharge Instructions: Cleanse the wound with wound cleanser prior to applying a clean dressing using gauze sponges, not tissue or cotton balls. Peri-Wound Care: Zinc Oxide Ointment 30g tube 1 x Per Week Discharge Instructions: Apply Zinc Oxide to periwound with each dressing change Peri-Wound Care: Sween Lotion (Moisturizing lotion) 1 x Per Week Discharge Instructions: Apply moisturizing lotion as directed Prim Dressing: KerraCel Ag Gelling Fiber Dressing, 4x5 in  (silver alginate) 1 x Per Week ary Discharge Instructions: Apply silver alginate to wound bed as instructed Secondary Dressing: gauze and zetuvit 1 x Per Week Discharge Instructions: apply gauze between the toes and apply zetuvit pads over the toes secure with the compression wrap to dorsal foot to aid in drainage. Secured With: Coban Self-Adherent Wrap 4x5 (in/yd) 1 x Per Week Discharge Instructions: Secure with Coban apply lightly as directed. Secured With: Insurance underwriter, Sterile 2x75 (in/in) 1 x Per Week Discharge Instructions: Secure with stretch gauze as directed. Wound #205 - Lower Leg Wound Laterality: Left, Posterior Cleanser: Soap and Water 1 x Per Week Discharge Instructions: May shower and wash wound with dial antibacterial soap and water prior to dressing change. Cleanser: Wound Cleanser 1 x Per Week Discharge Instructions: Cleanse the wound with wound cleanser prior to applying a clean dressing using gauze sponges, not tissue or cotton balls. Peri-Wound Care: Zinc Oxide Ointment 30g tube 1 x Per Week Discharge Instructions: Apply Zinc Oxide to periwound with each dressing change Peri-Wound Care: Sween Lotion (Moisturizing lotion) 1 x Per Week Discharge Instructions: Apply moisturizing lotion as directed Prim Dressing: KerraCel Ag Gelling Fiber Dressing, 4x5 in (silver alginate) 1 x Per Week ary Discharge Instructions: Apply silver alginate to  wound bed as instructed Secondary Dressing: ABD Pad, 8x10 1 x Per Week Discharge Instructions: Apply over primary dressing as directed. Compression Wrap: FourPress (4 layer compression wrap) 1 x Per Week Discharge Instructions: ****UNNA BOOT FIRST LAYER APPLIED TO UPPER PORTION OF LOWER LEG.*** Wound #206 - Foot Wound Laterality: Dorsal, Left Cleanser: Soap and Water 1 x Per Week Discharge Instructions: May shower and wash wound with dial antibacterial soap and water prior to dressing change. Cleanser: Wound Cleanser  1 x Per Week Discharge Instructions: Cleanse the wound with wound cleanser prior to applying a clean dressing using gauze sponges, not tissue or cotton balls. Peri-Wound Care: Zinc Oxide Ointment 30g tube 1 x Per Week Discharge Instructions: Apply Zinc Oxide to periwound with each dressing change Peri-Wound Care: Sween Lotion (Moisturizing lotion) 1 x Per Week Discharge Instructions: Apply moisturizing lotion as directed Prim Dressing: KerraCel Ag Gelling Fiber Dressing, 4x5 in (silver alginate) 1 x Per Week ary Discharge Instructions: Apply silver alginate to wound bed as instructed Secondary Dressing: ABD Pad, 8x10 1 x Per Week Discharge Instructions: Apply over primary dressing as directed. Compression Wrap: FourPress (4 layer compression wrap) 1 x Per Week Discharge Instructions: ****UNNA BOOT FIRST LAYER APPLIED TO UPPER PORTION OF LOWER LEG.*** Wound #207 - T Third oe Wound Laterality: Left Cleanser: Soap and Water 1 x Per Week Discharge Instructions: May shower and wash wound with dial antibacterial soap and water prior to dressing change. Cleanser: Wound Cleanser 1 x Per Week Discharge Instructions: Cleanse the wound with wound cleanser prior to applying a clean dressing using gauze sponges, not tissue or cotton balls. Peri-Wound Care: Zinc Oxide Ointment 30g tube 1 x Per Week Discharge Instructions: Apply Zinc Oxide to periwound with each dressing change Peri-Wound Care: Sween Lotion (Moisturizing lotion) 1 x Per Week Discharge Instructions: Apply moisturizing lotion as directed Prim Dressing: KerraCel Ag Gelling Fiber Dressing, 4x5 in (silver alginate) 1 x Per Week ary Discharge Instructions: Apply silver alginate to wound bed as instructed Secured With: Coban Self-Adherent Wrap 4x5 (in/yd) 1 x Per Week Discharge Instructions: Secure with Coban apply lightly as directed. Secured With: Insurance underwriter, Sterile 2x75 (in/in) 1 x Per Week Discharge Instructions:  Secure with stretch gauze as directed. Electronic Signature(s) Signed: 10/03/2021 5:54:55 PM By: Shawn Stall RN, BSN Signed: 10/03/2021 6:11:30 PM By: Lenda Kelp PA-C Entered By: Shawn Stall on 10/03/2021 11:54:01 -------------------------------------------------------------------------------- Problem List Details Patient Name: Date of Service: [name], Orlo J. 10/03/2021 10:30 A M Medical Record Number: [id number] Patient Account Number: [id number] Date of Birth/Sex: Treating RN: [birth year] (70 y.o. 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/27/2018 No Yes skin L97.829 Non-pressure chronic ulcer of other part of left lower leg with unspecified 03/21/2021 No Yes severity L97.519 Non-pressure chronic ulcer of other part of right foot with unspecified severity 03/21/2021 No Yes L97.812 Non-pressure chronic ulcer of other part of right lower leg with fat layer 08/08/2021 No Yes exposed I87.333 Chronic venous hypertension (idiopathic) with ulcer and inflammation of 01/22/2016 No Yes bilateral lower extremity L03.116 Cellulitis of left lower limb 08/20/2021 No Yes I89.0 Lymphedema, not elsewhere classified 01/22/2016 No Yes E11.40 Type 2 diabetes mellitus with diabetic neuropathy, unspecified 01/22/2016 No Yes E11.622 Type 2 diabetes mellitus with other skin ulcer  01/22/2016 No Yes L03.115 Cellulitis of right lower limb 12/22/2017 No Yes Inactive Problems ICD-10 Code Description Active Date Inactive Date 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 06/30/2018 06/30/2018 L97.511 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 of  skin 06/30/2018 06/30/2018 Resolved Problems ICD-10 Code Description Active Date Resolved Date L97.211 Non-pressure chronic ulcer of right calf limited to breakdown of skin 06/30/2018 06/30/2018 L97.221 Non-pressure chronic ulcer of left calf limited to breakdown of skin 09/30/2016 09/30/2016 L03.116 Cellulitis of left lower limb 04/01/2017 04/01/2017 L97.211 Non-pressure chronic ulcer of right calf limited to breakdown of skin 06/30/2017 06/30/2017 Electronic Signature(s) Signed: 10/03/2021 10:59:46 AM By: Lenda Kelp PA-C Entered By: Lenda Kelp on 10/03/2021 10:59:45 -------------------------------------------------------------------------------- Progress Note Details Patient Name: Date of Service: [name], Edilson J. 10/03/2021 10:30 A M Medical Record Number: [id number] Patient Account Number: [id number] Date of Birth/Sex: Treating RN: [birth year] (70 y.o. [name] Primary Care Provider: Nicoletta Ba Other Clinician: Referring Provider: Treating Provider/Extender: Adele Dan in Treatment: 297 Subjective Chief Complaint Information obtained from Patient Patient is here for evaluation of venous/lymphedema ulcers History of Present Illness (HPI) Referred by PCP for consultation. Patient has long standing history of BLE venous stasis, no prior ulcerations. At beginning of month, developed cellulitis and weeping. Received IM Rocephin followed by Keflex and resolved. Wears compression stocking, appr 6 months old. Not sure strength. No present drainage. 01/22/16 this is a patient who is a type II diabetic on insulin. 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 He tells me he religiously wears pressure stockings of uncertain strength. He was here with weeping edema about 8 months ago but did not have an open wound. Roughly a month ago he had a reopening on his bilateral legs. He is been using bandages and Neosporin. He does  not complain of pain. 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 Last BUN/creatinine I have is from 11/20/15 at 13 and 1.0 respectively 01/29/16; patient arrives today having tolerated the Profore wrap. He brought in his stockings and these are 18 mmHg stockings he bought from [employer]. The compression here is likely inadequate. He does not complain of pain or excessive drainage she has no systemic symptoms. The wound on the right looks improved as does the one on the left although one on the left is more substantial with still tissue at risk below the actual wound area on the bilateral posterior calf 02/05/16; patient arrives with poor edema control. He states that we did put a 4 layer compression on it last week. No weight appear 5 this. 02/12/16; the area on the posterior right Has healed. The left Has a substantial wound that has necrotic surface eschar that requires a debridement with a curette. 02/16/16;the patient called or a Nurse visit secondary to increased swelling. He had been in earlier in the week with his right leg healed. He was transitioned to is on pressure stocking on the right leg with the only open wound on the left, a substantial area on the left posterior calf. Note he has a history of severe lower extremity edema, he has a history of chronic atrial fibrillation but not heart failure per my notes but I'll need to research this. He is not complaining of chest pain shortness of breath or orthopnea. The intake nurse  noted blisters on the previously closed right leg 02/19/16; this is the patient's regular visit day. I see him on Friday with escalating edema new wounds on the right leg and clear signs of at least right ventricular heart failure. I increased his Lasix to 40 twice a day. He is returning currently in follow-up. States he is noticed a decrease in that the edema 02/26/16  patient's legs have much less edema. There is nothing really open on the right leg. The left leg has improved condition of the large superficial wound on the posterior left leg 03/04/16; edema control is very much better. The patient's right leg wounds have healed. On the left leg he continues to have severe venous inflammation on the posterior aspect of the left leg. There is no tenderness and I don't think any of this is cellulitis. 03/11/16; patient's right leg is married healed and he is in his own stocking. The patient's left leg has deteriorated somewhat. There is a lot of erythema around the wound on the posterior left leg. There is also a significant rim of erythema posteriorly just above where the wrap would've ended there is a new wound in this location and a lot of tenderness. Can't rule out cellulitis in this area. 03/15/16; patient's right leg remains healed and he is in his own stocking. The patient's left leg is much better than last review. His major wound on the posterior aspect of his left Is almost fully epithelialized. He has 3 small injuries from the wraps. Really. Erythema seems a lot better on antibiotics 03/18/16; right leg remains healed and he is in his own stocking. The patient's left leg is much better. The area on the posterior aspect of the left calf is fully epithelialized. His 3 small injuries which were wrap injuries on the left are improved only one seems still open his erythema has resolved 03/25/16; patient's right leg remains healed and he is in his own stocking. There is no open area today on the left leg posterior leg is completely closed up. His wrap injuries at the superior aspect of his leg are also resolved. He looks as though he has some irritation on the dorsal ankle but this is fully epithelialized without evidence of infection. 03/28/16; we discharged this patient on Monday. Transitioned him into his own stocking. There were problems almost immediately with  uncontrolled swelling weeping edema multiple some of which have opened. He does not feel systemically unwell in particular no chest pain no shortness of breath and he does not feel 04/08/16; the edema is under better control with the Profore light wrap but he still has pitting edema. There is one large wound anteriorly 2 on the medial aspect of his left leg and 3 small areas on the superior posterior calf. Drainage is not excessive he is tolerating a Profore light well 04/15/16; put a Profore wrap on him last week. This is controlled is edema however he had a lot of pain on his left anterior foot most of his wounds are healed 04/22/16 once again the patient has denuded areas on the left anterior foot which he states are because his wrap slips up word. He saw his primary physician today is on Lasix 40 twice a day and states that he his weight is down 20 pounds over the last 3 months. 04/29/16: Much improved. left anterior foot much improved. He is now on Lasix 80 mg per day. Much improved edema control 05/06/16; I was hoping to be able to  discharge him today however once again he has blisters at a low level of where the compression was placed last week mostly on his left lateral but also his left medial leg and a small area on the anterior part of the left foot. 05/09/16; apparently the patient went home after his appointment on 7/4 later in the evening developing pain in his upper medial thigh together with subjective fever and chills although his temperature was not taken. The pain was so intense he felt he would probably have to call 911. However he then remembered that he had leftover doxycycline from a previous round of antibiotics and took these. By the next morning he felt a lot better. He called and spoke to one of our nurses and I approved doxycycline over the phone thinking that this was in relation to the wounds we had previously seen although they were definitely were not. The patient feels a lot  better old fever no chills he is still working. Blood sugars are reasonably controlled 05/13/16; patient is back in for review of his cellulitis on his anterior medial upper thigh. He is taking doxycycline this is a lot better. Culture I did of the nodular area on the dorsal aspect of his foot grew MRSA this also looks a lot better. 05/20/16; the patient is cellulitis on the medial upper thigh has resolved. All of his wound areas including the left anterior foot, areas on the medial aspect of the left calf and the lateral aspect of the calf at all resolved. He has a new blister on the left dorsal foot at the level of the fourth toe this was excised. No evidence of infection 05/27/16; patient continues to complain weeping edema. He has new blisterlike wounds on the left anterior lateral and posterior lateral calf at the top of his wrap levels. The area on his left anterior foot appears better. He is not complaining of fever, pain or pruritus in his feet. 05/30/16; the patient's blisters on his left anterior leg posterior calf all look improved. He did not increase the Lasix 100 mg as I suggested because he was going to run out of his 40 mg tablets. He is still having weeping edema of his toes 06/03/16; I renewed his Lasix at 80 mg once a day as he was about to run out when I last saw him. He is on 80 mg of Lasix now. I have asked him to cut down on the excessive amount of water he was drinking and asked him to drink according to his thirst mechanisms 06/12/2016 -- was seen 2 days ago and was supposed to wear his compression stockings at home but he is developed lymphedema and superficial blisters on the left lower extremity and hence came in for a review 06/24/16; the remaining wound is on his left anterior leg. He still has edema coming from between his toes. There is lymphedema here however his edema is generally better than when I last saw this. He has a history of atrial fibrillation but does not have a  known history of congestive heart failure nevertheless I think he probably has this at least on a diastolic basis. 07/01/16 I reviewed his echocardiogram from January 2017. This was essentially normal. He did not have LVH, EF of 55-60%. His right ventricular function was normal although he did have trivial tricuspid and pulmonic regurgitation. This is not audible on exam however. I increased his Lasix to do massive edema in his legs well above his knees I  think in early July. He was also drinking an excessive amount of water at the time. 07/15/16; missed his appointment last week because of the Labor Day holiday on Monday. He could not get another appointment later in the week. Started to feel the wrap digging in superiorly so we remove the top half and the bottom half of his wrap. He has extensive erythema and blistering superiorly in the left leg. Very tender. Very swollen. Edema in his foot with leaking edema fluid. He has not been systemically unwell 07/22/16; the area on the left leg laterally required some debridement. The medial wounds look more stable. His wrap injury wounds appear to have healed. Edema and his foot is better, weeping edema is also better. He tells me he is meeting with the supplier of the external compression pumps at work 08/05/16; the patient was on vacation last week in [facility]. His wrap is been on for an extended period of time. Also over the weekend he developed an extensive area of tender erythema across his anterior medial thigh. He took to doxycycline yesterday that he had leftover from a previous prescription. The patient complains of weeping edema coming out of his toes 08/08/16; I saw this patient on 10/2. He was tender across his anterior thigh. I put him on doxycycline. He returns today in follow-up. He does not have any open wounds on his lower leg, he still has edema weeping into his toes. 08/12/16; patient was seen back urgently today to follow-up for his  extensive left thigh cellulitis/erysipelas. He comes back with a lot less swelling and erythema pain is much better. I believe I gave him Augmentin and Cipro. His wrap was cut down as he stated a roll down his legs. He developed blistering above the level of the wrap that remained. He has 2 open blisters and 1 intact. 08/19/16; patient is been doing his primary doctor who is increased his Lasix from 40-80 once a day or 80 already has less edema. Cellulitis has remained improved in the left thigh. 2 open areas on the posterior left calf 08/26/16; he returns today having new open blisters on the anterior part of his left leg. He has his compression pumps but is not yet been shown how to use some vital representative from the supplier. 09/02/16 patient returns today with no open wounds on the left leg. Some maceration in his plantar toes 09/10/2016 -- Dr. Leanord Hawking had recently discharged him on 09/02/2016 and he has come right back with redness swelling and some open ulcers on his left lower extremity. He says this was caused by trying to apply his compression stockings and he's been unable to use this and has not been able to use his lymphedema pumps. He had some doxycycline leftover and he has started on this a few days ago. 09/16/16; there are no open wounds on his leg on the left and no evidence of cellulitis. 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 He does not complain of symptoms here. I am not clear use using his external compression pumps. 09/23/16; I have not seen this patient in 2 weeks. He canceled his appointment 10 days ago as he was going on vacation. He tells me that on Monday he noticed a large area on his posterior left leg which is been draining copiously and is reopened into a large wound. He is been using ABDs and the external part of his juxtalite, according to our  nurse this was not on properly. 10/07/16; Still a substantial  area on the posterior left leg. Using silver alginate 10/14/16; in general better although there is still open area which looks healthy. Still using silver alginate. He reminds me that this happen before he left for [facility]. T oday while he was showering in the morning. He had been using his juxtalite's 10/21/16; the area on his posterior left leg is fully epithelialized. However he arrives today with a large area of tender erythema in his medial and posterior left thigh just above the knee. I have marked the area. Once again he is reluctant to consider hospitalization. I treated him with oral antibiotics in the past for a similar situation with resolution I think with doxycycline however this area it seems more extensive to me. He is not complaining of fever but does have chills and says states he is thirsty. His blood sugar today was in the 140s at home 10/25/16 the area on his posterior left leg is fully epithelialized although there is still some weeping edema. The large area of tenderness and erythema in his medial and posterior left thigh is a lot less tender although there is still a lot of swelling in this thigh. He states he feels a lot better. He is on doxycycline and Augmentin that I started last week. This will continued until Tuesday, December 26. I have ordered a duplex ultrasound of the left thigh rule out DVT whether there is an abscess something that would need to be drained I would also like to know. 11/01/16; he still has weeping edema from a not fully epithelialized area on his left posterior calf. Most of the rest of this looks a lot better. He has completed his antibiotics. His thigh is a lot better. Duplex ultrasound did not show a DVT in the thigh 11/08/16; he comes in today with more Denuded surface epithelium from the posterior aspect of his calf. There is no real evidence of cellulitis. The superior aspect of his wrap appears to have put quite an indentation in his leg  just below the knee and this may have contributed. He does not complain of pain or fever. We have been using silver alginate as the primary dressing. The area of cellulitis in the right thigh has totally resolved. He has been using his compression stockings once a week 11/15/16; the patient arrives today with more loss of epithelium from the posterior aspect of his left calf. He now has a fairly substantial wound in this area. The reason behind this deterioration isn't exactly clear although his edema is not well controlled. He states he feels he is generally more swollen systemically. He is not complaining of chest pain shortness of breath fever. T me he has an appointment with his primary physician in early February. He is on 80 mg of oral ells Lasix a day. He claims compliance with the external compression pumps. He is not having any pain in his legs similar to what he has with his recurrent cellulitis 11/22/16; the patient arrives a follow-up of his large area on his left lateral calf. This looks somewhat better today. He came in earlier in the week for a dressing change since I saw him a week ago. He is not complaining of any pain no shortness of breath no chest pain 11/28/16; the patient arrives for follow-up of his large area on the left lateral calf this does not look better. In fact it is larger weeping edema. The surface  of the wound does not look too bad. We have been using silver alginate although I'm not certain that this is a dressing issue. 12/05/16; again the patient follows up for a large wound on the left lateral and left posterior calf this does not look better. There continues to be weeping edema necrotic surface tissue. More worrisome than this once again there is erythema below the wound involving the distal Achilles and heel suggestive of cellulitis. He is on his feet working most of the day of this is not going well. We are changing his dressing twice a week to facilitate the  drainage. 12/12/16; not much change in the overall dimensions of the large area on the left posterior calf. This is very inflamed looking. I gave him an. Doxycycline last week does not really seem to have helped. He found the wrap very painful indeed it seems to of dog into his legs superiorly and perhaps around the heel. He came in early today because the drainage had soaked through his dressings. 12/19/16- patient arrives for follow-up evaluation of his left lower extremity ulcers. He states that he is using his lymphedema pumps once daily when there is "no drainage". He admits to not using his lipedema pumps while under current treatment. His blood sugars have been consistently between 150-200. 12/26/16; the patient is not using his compression pumps at home because of the wetness on his feet. I've advised him that I think it's important for him to use this daily. He finds his feet too wet, he can put a plastic bag over his legs while he is in the pumps. Otherwise I think will be in a vicious circle. We are using silver alginate to the major area on his left posterior calf 01/02/17; the patient's posterior left leg has further of all into 3 open wounds. All of them covered with a necrotic surface. He claims to be using his compression pumps once a day. His edema control is marginal. Continue with silver alginate 01/10/17; the patient's left posterior leg actually looks somewhat better. There is less edema, less erythema. Still has 3 open areas covered with a necrotic surface requiring debridement. He claims to be using his compression pumps once a day his edema control is better 01/17/17; the patient's left posterior calf look better last week when I saw him and his wrap was changed 2 days ago. He has noted increasing pain in the left heel and arrives today with much larger wounds extensive erythema extending down into the entire heel area especially tender medially. He is not systemically unwell CBGs have  been controlled no fever. Our intake nurse showed me limegreen drainage on his AVD pads. 01/24/17; his usual this patient responds nicely to antibiotics last week giving him Levaquin for presumed Pseudomonas. The whole entire posterior part of his leg is much better much less inflamed and in the case of his Achilles heel area much less tender. He has also had some epithelialization posteriorly there are still open areas here and still draining but overall considerably better 01/31/17- He has continue to tolerate the compression wraps. he states that he continues to use the lymphedema pumps daily, and can increase to twice daily on the weekends. He is voicing no complaints or concerns regarding his LLE ulcers 02/07/17-he is here for follow-up evaluation. He states that he noted some erythema to the left medial and anterior thigh, which he states is new as of yesterday. He is concerned about recurrent cellulitis. He states his blood  sugars have been slightly elevated, this morning in the 180s 02/14/17; he is here for follow-up evaluation. When he was last here there was erythema superiorly from his posterior wound in his anterior thigh. He was prescribed Levaquin however a culture of the wound surface grew MRSA over the phone I changed him to doxycycline on Monday and things seem to be a lot better. 02/24/17; patient missed his appointment on Friday therefore we changed his nurse visit into a physician visit today. Still using silver alginate on the large area of the posterior left thigh. He isn't new area on the dorsal left second toe 03/03/17; actually better today although he admits he has not used his external compression pumps in the last 2 days or so because of work responsibilities over the weekend. 03/10/17; continued improvement. External compression pumps once a day almost all of his wounds have closed on the posterior left calf. Better edema control 03/17/17; in general improved. He still has 3 small  open areas on the lateral aspect of his left leg however most of the area on the posterior part of his leg is epithelialized. He has better edema control. He has an ABD pad under his stocking on the right anterior lower leg although he did not let us look at that today. 03/24/17; patient arrives back in clinic today with no open areas however there are areas on the posterior left calf and anterior left calf that are less than 100% epithelialized. His edema is well controlled in the left lower leg. There is some pitting edema probably lymphedema in the left upper thigh. He uses compression pumps at home once per day. I tried to get him to do this twice a day although he is very reticent. 04/01/2017 -- for the last 2 days he's had significant redness, tenderness and weeping and came in for an urgent visit today. 04/07/17; patient still has 6 more days of doxycycline. He was seen by Dr. Meyer Russel last Wednesday for cellulitis involving the posterior aspect, lateral aspect of his Involving his heel. For the most part he is better there is less erythema and less weeping. He has been on his feet for 12 hours o2 over the weekend. Using his compression pumps once a day 04/14/17 arrives today with continued improvement. Only one area on the posterior left calf that is not fully epithelialized. He has intense bilateral venous inflammation associated with his chronic venous insufficiency disease and secondary lymphedema. We have been using silver alginate to the left posterior calf wound In passing he tells [place] today that the right leg but we have not seen in quite some time has an open area on it but he doesn't want [place] to look at this today states he will show this to [place] next week. 04/21/17; there is no open area on his left leg although he still reports some weeping edema. He showed [place] his right leg today which is the first time we've seen this leg in a long time. He has a large area of open wound on the right leg  anteriorly healthy granulation. Quite a bit of swelling in the right leg and some degree of venous inflammation. He told us about the right leg in passing last week but states that deterioration in the right leg really only happened over the weekend 04/28/17; there is no open area on the left leg although there is an irritated part on the posterior which is like a wrap injury. The wound on the right leg  which was new from last week at least to [place] is a lot better. 05/05/17; still no open area on the left leg. Patient is using his new compression stocking which seems to be doing a good job of controlling the edema. He states he is using his compression pumps once per day. The right leg still has an open wound although it is better in terms of surface area. Required debridement. A lot of pain in the posterior right Achilles marked tenderness. Usually this type of presentation this patient gives concern for an active cellulitis 05/12/17; patient arrives today with his major wound from last week on the right lateral leg somewhat better. Still requiring debridement. He was using his compression stocking on the left leg however that is reopened with superficial wounds anteriorly he did not have an open wound on this leg previously. He is still using his juxta light's once daily at night. He cannot find the time to do this in the morning as he has to be at work by 7 AM 05/19/17; right lateral leg wound looks improved. No debridement required. The concerning area is on the left posterior leg which appears to almost have a subcutaneous hemorrhagic component to it. We've been using silver alginate to all the wounds 05/26/17; the right lateral leg wound continues to look improved. However the area on the left posterior calf is a tightly adherent surface. [place] using silver alginate. Because of the weeping edema in his legs there is very little good alternatives. 06/02/17; the patient left here last week looking quite  good. Major wound on the left posterior calf and a small one on the right lateral calf. Both of these look satisfactory. He tells me that by Wednesday he had noted increased pain in the left leg and drainage. He called on Thursday and Friday to get an appointment here but we were blocked. He did not go to urgent care or his primary physician. He thinks he had a fever on Thursday but did not actually take his temperature. He has not been using his compression pumps on the left leg because of pain. I advised him to go to the emergency room today for IV antibiotics for stents of left leg cellulitis but he has refused I have asked him to take 2 days off work to keep his leg elevated and he has refused this as well. In view of this I'm going to call him and Augmentin and doxycycline. 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 However after a detailed discussion regarding the risk benefits and alternatives he refuses to get admitted to the hospital. With no other recourse I will continue him on oral antibiotics as before and hopefully he'll have his infectious disease consultation this week. 06/16/2017 -- the patient was seen today by the nurse practitioner at infectious disease Ms. Dixon. 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 He has also been advise Lotrimin cream locally. she also had wise IV antibiotics for his condition if it flares up 06/23/17; patient arrives today with drainage bilaterally although the remaining  wound on the left posterior calf after cleaning up today "highlighter  yellow drainage" did not look too bad. Unfortunately he has had breakdown on the right anterior leg [previously this leg had not been open and he is using a black stocking] he went to see infectious disease and is been put on clindamycin 150 mg daily, I did not verify the dose although I'm not familiar with using clindamycin in this dosing range, perhaps for prophylaxisoo 06/27/17; I brought this patient back today to follow-up on the wound deterioration on the right lower leg together with surrounding cellulitis. I started him on doxycycline 4 days ago. This area looks better however he comes in today with intense cellulitis on the medial part of his left thigh. This is not have a wound in this area. Extremely tender. We've been using silver alginate to the wounds on the right lower leg left lower leg with bilateral 4 layer compression he is using his external compression pumps once a day 07/04/17; patient's left medial thigh cellulitis looks better. He has not been using his compression pumps as his insert said it was contraindicated with cellulitis. His right leg continues to make improvements all the wounds are still open. We only have one remaining wound on the left posterior calf. Using silver alginate to all open areas. He is on doxycycline which I started a week ago and should be finishing I gave him Augmentin after Thursday's visit for the severe cellulitis on the left medial thigh which fortunately looks better 07/14/17; the patient's left medial thigh cellulitis has resolved. The cellulitis in his right lower calf on the right also looks better. All of his wounds are stable to improved we've been using silver alginate he has completed the antibiotics I have given him. He has clindamycin 150 mg once a day prescribed by infectious disease for prophylaxis, I've advised him to start this now. We have been using bilateral Unna boots over silver alginate to the wound areas 07/21/17; the patient is  been to see infectious disease who noted his recurrent problems with cellulitis. He was not able to tolerate prophylactic clindamycin therefore he is on amoxicillin 500 twice a day. He also had a second daily dose of Lasix added By Dr. Oneta Rack but he is not taking this. Nor is he being completely compliant with his compression pumps a especially not this week. He has 2 remaining wounds one on the right posterior lateral lower leg and one on the left posterior medial lower leg. 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 The patient has Unna boots bilaterally. 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 He has bilateral Unna boots for compression and silver alginate to his wounds. Arrives today with his legs looking as good as I have seen him in quite some time. Not surprisingly his wounds look better as well with improvement on the right lateral leg venous insufficiency wound and also the left medial leg. He is still using the compression pumps once a day 08/11/17; both legs appear to be doing better wounds on the right lateral and left medial legs look better. Skin on the right leg quite good. He is been using silver alginate as the primary dressing. I'm going to use Anasept gel calcium alginate and maintain all the secondary dressings  08/18/17; the patient continues to actually do quite well. The area on his right lateral leg is just about closed the left medial also looks better although it is still moist in this area. His edema is well controlled we have been using Anasept gel with calcium alginate and the usual secondary dressings, 4 layer compression and once daily use of his compression pumps "always been able to manage 09/01/17; the patient continues to do reasonably well in  spite of his trip to [place]. The area on the right lateral leg is epithelialized. Left is much better but still open. He has more edema and more chronic erythema on the left leg [venous inflammation] 09/08/17; he arrives today with no open wound on the right lateral leg and decently controlled edema. Unfortunately his left leg is not nearly as in his good situation as last week.he apparently had increasing edema starting on Saturday. He edema soaked through into his foot so used a plastic bag to walk around his home. The area on the medial right leg which was his open area is about the same however he has lost surface epithelium on the left lateral which is new and he has significant pain in the Achilles area of the left foot. He is already on amoxicillin chronically for prophylaxis of cellulitis in the left leg 09/15/17; he is completed a week of doxycycline and the cellulitis in the left posterior leg and Achilles area is as usual improved. He still has a lot of edema and fluid soaking through his dressings. There is no open wound on the right leg. He saw infectious disease NP today 09/22/17;As usual 1 we transition him from our compression wraps to his stockings things did not go well. He has several small open areas on the right leg. He states this was caused by the compression wrap on his skin although he did not wear this with the stockings over them. He has several superficial areas on the left leg medially laterally posteriorly. He does not have any evidence of active cellulitis especially involving the left Achilles The patient is traveling from [facility] Saturday going to [facility]. He states he isn't attempting to get an appointment with a heel objects wound center there to change his dressings. I am not completely certain whether this will work 10/06/17; the patient came in on Friday for a nurse visit and the nurse reported that his legs actually look quite good. He arrives in clinic  today for his regular follow-up visit. He has a new wound on his left third toe over the PIP probably caused by friction with his footwear. He has small areas on the left leg and a very superficial but epithelialized area on the right anterior lateral lower leg. Other than that his legs look as good as I've seen him in quite some time. We have been using silver alginate Review of systems; no chest pain no shortness of breath other than this a 10 point review of systems negative 10/20/17; seen by Dr. Meyer Russel last week. He had taken some antibiotics [doxycycline] that he had left over. Dr. Meyer Russel thought he had candida infection and declined to give him further antibiotics. He has a small wound remaining on the right lateral leg several areas on the left leg including a larger area on the left posterior several left medial and anterior and a small wound on the left lateral. The area on the left dorsal third toe looks a lot better. ROS; Gen.; no fever, respiratory  no cough no sputum Cardiac no chest pain other than this 10 point review of system is negative 10/30/17; patient arrives today having fallen in the bathtub 3 days ago. It took him a while to get up. He has pain and maceration in the wounds on his left leg which have deteriorated. He has not been using his pumps he also has some maceration on the right lateral leg. 11/03/17; patient continues to have weeping edema especially in the left leg. This saturates his dressings which were just put on on 12/27. As usual the doxycycline seems to take care of the cellulitis on his lower leg. He is not complaining of fever, chills, or other systemic symptoms. He states his leg feels a lot better on the doxycycline I gave him empirically. He also apparently gets injections at his primary doctor's officeo Rocephin for cellulitis prophylaxis. I didn't ask him about his compression pump compliance today I think that's probably marginal. Arrives in the clinic  with all of his dressings primary and secondary macerated full of fluid and he has bilateral edema 11/10/17; the patient's right leg looks some better although there is still a cluster of wounds on the right lateral. The left leg is inflamed with almost circumferential skin loss medially to laterally although we are still maintaining anteriorly. He does not have overt cellulitis there is a lot of drainage. He is not using compression pumps. We have been using silver alginate to the wound areas, there are not a lot of options here 11/17/17; the patient's right leg continues to be stable although there is still open wounds, better than last week. The inflammation in the left leg is better. Still loss of surface layer epithelium especially posteriorly. There is no overt cellulitis in the amount of edema and his left leg is really quite good, tells me he is using his compression pumps once a day. 11/24/17; patient's right leg has a small superficial wound laterally this continues to improve. The inflammation in the left leg is still improving however we have continuous surface layer epithelial loss posteriorly. There is no overt cellulitis in the amount of edema in both legs is really quite good. He states he is using his compression pumps on the left leg once a day for 5 out of 7 days 12/01/17; very small superficial areas on the right lateral leg continue to improve. Edema control in both legs is better today. He has continued loss of surface epithelialization and left posterior calf although I think this is better. We have been using silver alginate with large number of absorptive secondary dressings 4 layer on the left Unna boot on the right at his request. He tells me he is using his compression pumps once a day 12/08/17; he has no open area on the right leg is edema control is good here. ooOn the left leg however he has marked erythema and tenderness breakdown of skin. He has what appears to be a wrap  injury just distal to the popliteal fossa. 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 There was swelling erythema and breakdown in the right leg. He has much worse skin breakdown in the left leg as well multiple open areas medially and posteriorly as well  as laterally. He tells me he has been using his compression pumps but tells me he feels that the drainage out of his leg is worse when he uses a compression pumps. T be fair to him he is been saying this o for a while however I don't know that I have really been listening to this. I wonder if the compression pumps are working properly 12/22/17;. Once again he arrives with severe erythema, weeping edema from the left greater than right leg. Noncompliance with compression pumps. New this visit he is complaining of pain on the lateral aspect of the right leg and the medial aspect of his right thigh. He apparently saw his cardiologist Dr. [name] who was ordered an echocardiogram area and I think this is a step in the right direction 12/25/17; started his doxycycline Monday night. There is still intense erythema of the right leg especially in the anterior thigh although there is less tenderness. The erythema around the wound on the right lateral calf also is less tender. He still complaining of pain in the left heel. His wounds are about the same right lateral left medial left lateral. Superficial but certainly not close to closure. He denies being systemically unwell no fever chills no abdominal pain no diarrhea 12/29/17; back in follow-up of his extensive right calf and right thigh cellulitis. I added amoxicillin to cover possible doxycycline resistant strep. This seems to of done  the trick he is in much less pain there is much less erythema and swelling. He has his echocardiogram at 11:00 this morning. X-ray of the left heel was also negative. 01/05/18; the patient arrived with his edema under much better control. Now that he is retired he is able to use his compression pumps daily and sometimes twice a day per the patient. He has a wound on the right leg the lateral wound looks better. Area on the left leg also looks a lot better. He has no evidence of cellulitis in his bilateral thighs I had a quick peak at his echocardiogram. He is in normal ejection fraction and normal left ventricular function. He has moderate pulmonary hypertension moderately reduced right ventricular function. One would have to wonder about chronic sleep apnea although he says he doesn't snore. He'll review the echocardiogram with his cardiologist. 01/12/18; the patient arrives with the edema in both legs under exemplary control. He is using his compression pumps daily and sometimes twice daily. His wound on the right lateral leg is just about closed. He still has some weeping areas on the posterior left calf and lateral left calf although everything is just about closed here as well. I have spoken with Aldean Baker who is the patient's nurse practitioner and infectious disease. She was concerned that the patient had not understood that the parenteral penicillin injections he was receiving for cellulitis prophylaxis was actually benefiting him. I don't think the patient actually saw that I would tend to agree we were certainly dealing with less infections although he had a serious one last month. 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 He is healed. He'll be placed in juxtalite compression wraps and increase his lymphedema pumps to twice daily. We will follow up again next week to ensure there are no issues with the new regiment. 01/20/18-he is here for evaluation  of bilateral lower extremity weeping edema. Yesterday he was placed in compression wrap to the right lower extremity and compression stocking to left lower shrubbery. He states he uses  lymphedema pumps last night and again this morning and noted a blister to the left lower extremity. On exam he was noted to have drainage to the right lower extremity. He will be placed in Unna boots bilaterally and follow-up next week 01/26/18; patient was actually discharged a week ago to his own juxta light stockings only to return the next day with bilateral lower extremity weeping edema.he was placed in bilateral Unna boots. He arrives today with pain in the back of his left leg. There is no open area on the right leg however there is a linear/wrap injury on the left leg and weeping edema on the left leg posteriorly. I spoke with infectious disease about 10 days ago. They were disappointed that the patient elected to discontinue prophylactic intramuscular penicillin shots as they felt it was particularly beneficial in reducing the frequency of his cellulitis. I discussed this with the patient today. He does not share this view. He'll definitely need antibiotics today. Finally he is traveling to [place] and trauma leaving this Saturday and returning a week later and he does not travel with his pumps. He is going by car 01/30/18; patient was seen 4 days ago and brought back in today for review of cellulitis in the left leg posteriorly. I put him on amoxicillin this really hasn't helped as much as I might like. He is also worried because he is traveling to [facility] trauma by car. Finally we will be rewrapping him. There is no open area on the right leg over his left leg has multiple weeping areas as usual 02/09/18; The same wrap on for 10 days. He did not pick up the last doxycycline I prescribed for him. He apparently took 4 days worth he already had. There is nothing open on his right leg and the edema control is  really quite good. He's had damage in the left leg medially and laterally especially probably related to the prolonged use of Unna boots 02/12/18; the patient arrived in clinic today for a nurse visit/wrap change. He complained of a lot of pain in the left posterior calf. He is taking doxycycline that I previously prescribed for him. Unfortunately even though he used his stockings and apparently used to compression pumps twice a day he has weeping edema coming out of the lateral part of his right leg. This is coming from the lower anterior lateral skin area. 02/16/18; the patient has finished his doxycycline and will finish the amoxicillin 2 days. The area of cellulitis in the left calf posteriorly has resolved. He is no longer having any pain. He tells me he is using his compression pumps at least once a day sometimes twice. 02/23/18; the patient finished his doxycycline and Amoxil last week. On Friday he noticed a small erythematous circle about the size of a quarter on the left lower leg just above his ankle. This rapidly expanded and he now has erythema on the lateral and posterior part of the thigh. This is bright red. Also has an area on the dorsal foot just above his toes and a tender area just below the left popliteal fossa. He came off his prophylactic penicillin injections at his own insistence one or 2 months ago. This is obviously deteriorated since then 03/02/18; patient is on doxycycline and Amoxil. Culture I did last week of the weeping area on the back of his left calf grew group B strep. I have therefore renewed the amoxicillin 500 3 times a day for a further week. He has  not been systemically unwell. Still complaining of an area of discomfort right under his left popliteal fossa. There is no open wound on the right leg. He tells me that he is using his pumps twice a day on most days 03/09/18; patient arrives in clinic today completing his amoxicillin today. The cellulitis on his left leg is  better. Furthermore he tells me that he had intramuscular penicillin shots that his primary care office today. However he also states that the wrap on his right leg fell down shortly after leaving clinic last week. He developed a large blister that was present when he came in for a nurse visit later in the week and then he developed intense discomfort around this area.He tells me he is using his compression pumps 03/16/18; the patient has completed his doxycycline. The infectious part of this/cellulitis in the left heel area left popliteal area is a lot better. He has 2 open areas on the right calf. Still areas on the left calf but this is a lot better as well. 03/24/18; the patient arrives complaining of pain in the left popliteal area again. He thinks some of this is wrap injury. He has no open area on the right leg and really no open area on the left calf either except for the popliteal area. He claims to be compliant with the compression pumps 03/31/18; I gave him doxycycline last week because of cellulitis in the left popliteal area. This is a lot better although the surface epithelium is denuded off and response to this. He arrives today with uncontrolled edema in the right calf area as well as a fingernail injury in the right lateral calf. There is only a few open areas on the left 04/06/18; I gave him amoxicillin doxycycline over the last 2 weeks that the amoxicillin should be completing currently. He is not complaining of any pain or systemic symptoms. The only open areas see has is on the right lateral lower leg paradoxically I cannot see anything on the left lower leg. He tells me he is using his compression pumps twice a day on most days. Silver alginate to the wounds that are open under 4 layer compression 04/13/18; he completed antibiotics and has no new complaints. Using his compression pumps. Silver alginate that anything that's opened 04/20/18; he is using his compression pumps religiously.  Silver alginate 4 layer compression anything that's opened. He comes in today with no open wounds on the left leg but 3 on the right including a new one posteriorly. He has 2 on the right lateral and one on the right posterior. He likes Unna boots on the right leg for reasons that aren't really clear we had the usual 4 layer compression on the left. It may be necessary to move to the 4 layer compression on the right however for now I left them in the Unna boots 04/27/18; he is using his compression pumps at least once a day. He has still the wounds on the right lateral calf. The area right posteriorly has closed. He does not have an open wound on the left under 4 layer compression however on the dorsal left foot just proximal to the toes and the left third toe 2 small open areas were identified 05/11/18; he has not uses compression pumps. The areas on the right lateral calf have coalesced into one large wound necrotic surface. On the left side he has one small wound anteriorly however the edema is now weeping out of a large part  of his left leg. He says he wasn't using his pumps because of the weeping fluid. I explained to him that this is the time he needs to pump more 05/18/18; patient states he is using his compression pumps twice a day. The area on the right lateral large wound albeit superficial. On the left side he has innumerable number of small new wounds on the left calf particularly laterally but several anteriorly and medially. All these appear to have healthy granulated base these look like the remnants of blisters however they occurred under compression. The patient arrives in clinic today with his legs somewhat better. There is certainly less edema, less multiple open areas on the left calf and the right anterior leg looks somewhat better as well superficial and a little smaller. However he relates pain and erythema over the last 3-4 days in the thigh and I looked at this today. He has not  been systemically unwell no fever no chills no change in blood sugar values 05/25/18; comes in today in a better state. The severe cellulitis on his left leg seems better with the Keflex. Not as tender. He has not been systemically unwell ooHard to find an open wound on the left lower leg using his compression pumps twice a day ooThe confluent wounds on his right lateral calf somewhat better looking. These will ultimately need debridement I didn't do this today. 06/01/18; the severe cellulitis on the left anterior thigh has resolved and he is completed his Keflex. ooThere is no open wound on the left leg however there is a superficial excoriation at the base of the third toe dorsally. Skin on the bottom of his left foot is macerated looking. ooThe left the wounds on the lateral right leg actually looks some better although he did require debridement of the top half of this wound area with an open curet 06/09/18 on evaluation today patient appears to be doing poorly in regard to his right lower extremity in particular this appears to likely be infected he has very thick purulent discharge along with a bright green tent to the discharge. This makes me concerned about the possibility of pseudomonas. He's also having increased discomfort at this point on evaluation. Fortunately there does not appear to be any evidence of infection spreading to the other location at this time. 06/16/18 on evaluation today patient appears to actually be doing fairly well. His ulcer has actually diminished in size quite significantly at this point which is good news. Nonetheless he still does have some evidence of infection he did see infectious disease this morning before coming here for his appointment. I did review the results of their evaluation and their note today. They did actually have him discontinue the Cipro and initiate treatment with linezolid at this time. He is doing this for the next seven days and they  recommended a follow-up in four months with them. He is the keep a log of the need for intermittent antibiotic therapy between now and when he falls back up with infectious disease. This will help them gaze what exactly they need to do to try and help them out. 06/23/18; the patient arrives today with no open wounds on the left leg and left third toe healed. He is been using his compression pumps twice a day. On the right lateral leg he still has a sizable wound but this is a lot better than last time I saw this. In my absence he apparently cultured MRSA coming from this wound and  is completed a course of linezolid as has been directed by infectious disease. Has been using silver alginate under 4 layer compression 06/30/18; the only open wound he has is on the right lateral leg and this looks healthy. No debridement is required. We have been using silver alginate. He does not have an open wound on the left leg. There is apparently some drainage from the dorsal proximal third toe on the left although I see no open wound here. 07/03/18 on evaluation today patient was actually here just for a nurse visit rapid change. However when he was here on Wednesday for his rat change due to having been healed on the left and then developing blisters we initiated the wrap again knowing that he would be back today for [place] to reevaluate and see were at. Unfortunately he has developed some cellulitis into the proximal portion of his right lower extremity even into the region of his thigh. 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 He was placed on one as what at that point. Nonetheless he is done with that and has been tolerating it well otherwise. Doxycycline which in the past really did not seem to be effective for him. Nonetheless I think the best option may be for [place] to definitely reinitiate the antibiotics for a longer period of time. 07/07/18; since I last saw this patient a  week ago he has had a difficult time. At that point he did not have an open wound on his left leg. We transitioned him into juxta light stockings. He was apparently in the clinic the next day with blisters on the left lateral and left medial lower calf. He also had weeping edema fluid. He was put back into a compression wrap. He was also in the clinic on Friday with intense erythema in his right thigh. Per the patient he was started on Bactrim however that didn't work at all in terms of relieving his pain and swelling. He has taken 3 doxycycline that he had left over from last time and that seems to of helped. He has blistering on the right thigh as well. 07/14/18; the erythema on his right thigh has gotten better with doxycycline that he is finishing. 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 Presumably this cellulitis in the thigh was not related to that although I think this is a bit concerning going forward. He still has an area on the right lateral calf the blister on the right medial calf just below the knee that was discussed above. On the left 2 small open areas left medial and left lateral. Edema control is adequate. He is using his compression pumps twice a day 07/20/18; continued improvement in the condition of both legs especially the edema in his bilateral thighs. He tells me he is been losing weight through a combination of diet and exercise. He is using his compression pumps twice a day. So overall she made to the remaining wounds 07/27/2018; continued improvement in condition of both legs. His edema is well controlled. The area on the right lateral leg is just about closed he had one blisters show up on the medial left upper calf. We have him in 4 layer compression. He is going on a 10-day trip to [place]. He will be driving. He wants to wear Unna boots because of the lessening  amount of  constriction. He will not use compression pumps while he is away 08/05/18 on evaluation today patient actually appears to be doing decently well all things considered in regard to his bilateral lower extremities. The worst ulcer is actually only posterior aspect of his left lower extremity with a four layer compression wrap cut into his leg a couple weeks back. He did have a trip and actually had [employer] for the trip that he is worn since he was last here. Nonetheless he feels like the [employer] actually do better for him his swelling is up a little bit but he also with his trip was not taking his Lasix on a regular set schedule like he was supposed to be. He states that obviously the reason being that he cannot drive and keep going without having to urinate too frequently which makes it difficult. He did not have his pumps with him while he was away either which I think also maybe playing a role here too. 08/13/2018; the patient only has a small open wound on the right lateral calf which is a big improvement in the last month or 2. He also has the area posteriorly just below the posterior fossa on the left which I think was a wrap injury from several weeks ago. He has no current evidence of cellulitis. He tells me he is back into his compression pumps twice a day. He also tells me that while he was at the laundromat somebody stole a section of his extremitease stockings 08/20/2018; back in the clinic with a much improved state. He only has small areas on the right lateral mid calf which is just about healed. This was is more substantial area for quite a prolonged period of time. He has a small open area on the left anterior tibia. The area on the posterior calf just below the popliteal fossa is closed today. He is using his compression pumps twice a day 08/28/2018; patient has no open wound on the right leg. He has a smattering of open areas on the calf with some weeping lymphedema. More  problematically than that it looks as though his wraps of slipped down in his usual he has very angry upper area of edema just below the right medial knee and on the right lateral calf. He has no open area on his feet. The patient is traveling to [facility] next week. I will send him in an antibiotic. We will continue to wrap the right leg. We ordered extremitease stockings for him last week and I plan to transition the right leg to a stocking when he gets home which will be in 10 days time. As usual he is very reluctant to take his pumps with him when he travels 09/07/2018; patient returns from [facility]. He shows me a picture of his left leg in the mid part of his trip last week with intense fire engine erythema. The picture look bad enough I would have considered sending him to the hospital. Instead he went to the wound care center in [facility]. They did not prescribe him antibiotics but he did take some doxycycline he had leftover from a previous visit. I had given him trimethoprim sulfamethoxazole before he left this did not work according to the patient. This is resulted in some improvement fortunately. He comes back with a large wound on the left posterior calf. Smaller area on the left anterior tibia. Denuded blisters on the dorsal left foot over  his toes. Does not have much in the way of wounds on the right leg although he does have a very tender area on the right posterior area just below the popliteal fossa also suggestive of infection. He promises me he is back on his pumps twice a day 09/15/2018; the intense cellulitis in his left lower calf is a lot better. The wound area on the posterior left calf is also so better. However he has reasonably extensive wounds on the dorsal aspect of his second and third toes and the proximal foot just at the base of the toes. There is nothing open on the right leg 09/22/2018; the patient has excellent edema control in his legs  bilaterally. He is using his external compression pumps twice a day. He has no open area on the right leg and only the areas in the left foot dorsally second and third toe area on the left side. He does not have any signs of active cellulitis. 10/06/2018; the patient has good edema control bilaterally. He has no open wound on the right leg. There is a blister in the posterior aspect of his left calf that we had to deal with today. He is using his compression pumps twice a day. There is no signs of active cellulitis. We have been using silver alginate to the wound areas. He still has vulnerable areas on the base of his left first second toes dorsally He has a his extremities stockings and we are going to transition him today into the stocking on the right leg. He is cautioned that he will need to continue to use the compression pumps twice a day. If he notices uncontrolled edema in the right leg he may need to go to 3 times a day. 10/13/2018; the patient came in for a nurse check on Friday he has a large flaccid blister on the right medial calf just below the knee. We unroofed this. He has this and a new area underneath the posterior mid calf which was undoubtedly a blister as well. He also has several small areas on the right which is the area we put his extremities stocking on. 10/19/2018; the patient went to see infectious disease this morning I am not sure if that was a routine follow-up in any case the doxycycline I had given him was discontinued and started on linezolid. He has not started this. It is easy to look at his left calf and the inflammation and think this is cellulitis however he is very tender in the tissue just below the popliteal fossa and I have no doubt that there is infection going on here. He states the problem he is having is that with the compression pumps the edema goes down and then starts walking the wrap falls down. We will see if we can adhere this. He has 1 or 2 minuscule  open areas on the right still areas that are weeping on the posterior left calf, the base of his left second and third toes 10/26/18; back today in clinic with quite of skin breakdown in his left anterior leg. This may have been infection the area below the popliteal fossa seems a lot better however tremendous epithelial loss on the left anterior mid tibia area over quite inexpensive tissue. He has 2 blisters on the right side but no other open wound here. 10/29/2018; came in urgently to see [place] today and we worked him in for review. He states that the 4 layer compression on the right leg caused  pain he had to cut it down to roughly his mid calf this caused swelling above the wrap and he has blisters and skin breakdown today. As a result of the pain he has not been using his pumps. Both legs are a lot more edematous and there is a lot of weeping fluid. 11/02/18; arrives in clinic with continued difficulties in the right leg> left. Leg is swollen and painful. multiple skin blisters and new open areas especially laterally. He has not been using his pumps on the right leg. 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 He is not systemically unwell. 11/09/2018; the patient claims he is being compliant with his pumps. He is finished the doxycycline I gave him last week. Culture I did of the wound on the right lateral leg showed a few very resistant methicillin staph aureus. This was resistant to doxycycline. Nevertheless he states the pain in the leg is a lot better which makes me wonder if the cultured organism was not really what was causing the problem nevertheless this is a very dangerous organism to be culturing out of any wound. His right leg is still a lot larger than the left. He is using an Radio broadcast assistant on this area, he blames a 4-layer compression for causing the original skin breakdown which I doubt is true however I cannot talk him out of it. We have been  using silver alginate to all of these areas which were initially blisters 11/16/2018; patient is being compliant with his external compression pumps at twice a day. Miraculously he arrives in clinic today with absolutely no open wounds. He has better edema control on the left where he has been using 4 layer compression versus wound of wounds on the right and I pointed this out to him. There is no inflammation in the skin in his lower legs which is also somewhat unusual for him. There is no open wounds on the dorsal left foot. He has extremitease stockings at home and I have asked him to bring these in next week. 11/25/18 patient's lower extremity on examination today on the left appears for the most part to be wound free. He does have an open wound on the lateral aspect of the right lower extremity but this is minimal compared to what I've seen in past. He does request that we go ahead and wrap the left leg as well even though there's nothing open just so hopefully it will not reopen in short order. 1/28; patient has superficial open wounds on the right lateral calf left anterior calf and left posterior calf. His edema control is adequate. 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 We have been using silver alginate as the primary dressing. He claims compliance with his compression pumps 2/4; patient has superficial open wounds on numerous areas of his left calf and again one on the left dorsal foot. The areas on the right lateral calf have healed. The cellulitis that I gave him doxycycline for last week is also resolved this was mostly on the left anterior calf just below the tibial tuberosity. His edema looks fairly well-controlled. He tells me he went to see his primary doctor today and had blood work ordered 2/11; once again he has several open areas on the left calf left tibial area. Most of these are small and  appear to have healthy granulation. He does not  have anything open on the right. The edema and control in his thighs is pretty good which is usually a good indication he has been using his pumps as requested. 2/18; he continues to have several small areas on the left calf and left tibial area. Most of these are small healthy granulation. We put him in his stocking on the right leg last week and he arrives with a superficial open area over the right upper tibia and a fairly large area on the right lateral tibia in similar condition. His edema control actually does not look too bad, he claims to be using his compression pumps twice a day 2/25. Continued small areas on the left calf and left tibial area. New areas especially on the right are identified just below the tibial tuberosity and on the right upper tibia itself. There are also areas of weeping edema fluid even without an obvious wound. He does not have a considerable degree of lymphedema but clearly there is more edema here than his skin can handle. He states he is using the pumps twice a day. We have an Unna boot on the right and 4 layer compression on the left. 3/3; he continues to have an area on the right lateral calf and right posterior calf just below the popliteal fossa. There is a fair amount of tenderness around the wound on the popliteal fossa but I did not see any evidence of cellulitis, could just be that the wrap came down and rubbed in this area. ooHe does not have an open area on the left leg however there is an area on the left dorsal foot at the base of the third toe ooWe have been using silver alginate to all wound areas 3/10; he did not have an open area on his left leg last time he was here a week ago. T oday he arrives with a horizontal wound just below the tibial tuberosity and an area on the left lateral calf. He has intense erythema and tenderness in this area. The area is on the right lateral calf and right posterior  calf better than last week. We have been using silver alginate as usual 3/18 - Patient returns with 3 small open areas on left calf, and 1 small open area on right calf, the skin looks ok with no significant erythema, he continues the UNA boot on right and 4 layer compression on left. The right lateral calf wound is closed , the right posterior is small area. we will continue silver alginate to the areas. 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 He is been tolerating the dressing changes without complication. Fortunately she has made excellent improvement in regard to the overall status of his wounds. Unfortunately every time we cease wrapping him he ends up reopening in causing more significant issues at that point. Again I'm unsure of the best direction to take although I think the lymphedema clinic may be appropriate for him. 02/03/19 on evaluation today patient appears to be doing well in regard to the wounds that we saw him for last week unfortunately he has a new area on the proximal portion of his right medial/posterior lower extremity where the wrap somewhat slowed down and caused swelling and a blister to rub and open. Unfortunately this is the only opening that he has  on either leg at this point. 02/17/19 on evaluation today patient's bilateral lower extremities appear to be doing well. He still completely healed in regard to the left lower extremity. In regard to the right lower extremity the area where the wrap and slid down and caused the blister still seems to be slightly open although this is dramatically better than during the last evaluation two weeks ago. I'm very pleased with the way this stands overall. 03/03/19 on evaluation today patient appears to be doing well in regard to his right  lower extremity in general although he did have a new blister open this does not appear to be showing any evidence of active infection at this time. Fortunately there's No fevers, chills, nausea, or vomiting noted at this time. Overall I feel like he is making good progress it does feel like that the right leg will we perform the [employer] seems to do with a bit better than three layer wrap on the left which slid down on him. We may switch to doing bilateral in the book wraps. 5/4; I have not seen Mr. Decoste in quite some time. According to our case manager he did not have an open wound on his left leg last week. He had 1 remaining wound on the right posterior medial calf. He arrives today with multiple openings on the left leg probably were blisters and/or wrap injuries from Unna boots. I do not think the Unna boot's will provide adequate compression on the left. I am also not clear about the frequency he is using the compression pumps. 03/17/19 on evaluation today patient appears to be doing excellent in regard to his lower extremities compared to last week's evaluation apparently. He had gotten significantly worse last week which is unfortunate. The [employer] wrap on the left did not seem to do very well for him at all and in fact it didn't control his swelling significantly enough he had an additional outbreak. Subsequently we go back to the four layer compression wrap on the left. This is good news. At least in that he is doing better and the wound seem to be killing him. He still has not heard anything from the lymphedema clinic. 03/24/19 on evaluation today patient actually appears to be doing much better in regard to his bilateral lower Trinity as compared to last week when I saw him. Fortunately there's no signs of active infection at this time. He has been tolerating the dressing changes without complication. Overall I'm extremely pleased with the progress and appearance in general. 04/07/19 on  evaluation today patient appears to be doing well in regard to his bilateral lower extremities. His swelling is significantly down from where it was previous. With that being said he does have a couple blisters still open at this point but fortunately nothing that seems to be too severe and again the majority of the larger openings has healed at this time. 04/14/19 on evaluation today patient actually appears to be doing quite well in regard to his bilateral lower extremities in fact I'm not even sure there's anything significantly open at this time at any site. Nonetheless he did have some trouble with these wraps where they are somewhat irritating him secondary to the fact that he has noted that the graph wasn't too close down to the end of this foot in a little bit short as well up to his knee. Otherwise things seem to be doing quite well. 04/21/19 upon evaluation today patient's wound bed actually  showed evidence of being completely healed in regard to both lower extremities which is excellent news. There does not appear to be any signs of active infection which is also good news. I'm very pleased in this regard. No fevers, chills, nausea, or vomiting noted at this time. 04/28/19 on evaluation today patient appears to be doing a little bit worse in regard to both lower extremities on the left mainly due to the fact that when he went infection disease the wrap was not wrapped quite high enough he developed a blister above this. On the right he is a small open area of nothing too significant but again this is continuing to give him some trouble he has been were in the Velcro compression that he has at home. 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 He's been tolerating the dressing changes without complication. Fortunately there's no signs of active infection at this time. No fevers, chills, nausea, or vomiting noted at this time. We have  been trying to get an appointment with her lymphedema clinic in [facility] but unfortunately nobody can get them on phone with not been able to even fax information over the patient likewise is not been able to get in touch with them. Overall I'm not sure exactly what's going on here with to reach out again today. 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 Still having a lot of drainage unfortunately. He tells me especially in the left but even on the right. There's no signs of active infection which is good news we've been using so ratcheted up to this point. 05/19/19 on evaluation today patient actually appears to be doing quite well with regard to his left lower extremity which is great news. Fortunately in regard to the right lower extremity has an issues with his wrap and he subsequently did remove this from what I'm understanding. Nonetheless long story short is what he had rewrapped once he removed it subsequently had maggots underneath this wrap whenever he came in for evaluation today. With that being said they were obviously completely cleaned away by the nursing staff. The visit today which is excellent news. However he does appear to potentially have some infection around the right ankle region where the maggots were located as well. He will likely require anabiotic therapy today. 05/26/19 on evaluation today patient actually appears to be doing much better in regard to his bilateral lower extremities. I feel like the infection is under much better control. With that being said there were maggots noted when the wrap was removed yet again today. Again this could have potentially been left over from previous although at this time there does not appear to be any signs of significant drainage there was obviously on the wrap some drainage as well this contracted gnats or otherwise. Either way I do  not see anything that appears to be doing worse in my pinion and in fact I think his drainage has slowed down quite significantly likely mainly due to the fact to his infection being under better control. 06/02/2019 on evaluation today patient actually appears to be doing well with regard to his bilateral lower extremities there is no signs of active infection at this time which is great news. With that being said he does have several open areas more so on the right than the left but nonetheless these are  all significantly better than previously noted. 06/09/2019 on evaluation today patient actually appears to be doing well. His wrap stayed up and he did not cause any problems he had more drainage on the right compared to the left but overall I do not see any major issues at this time which is great news. 06/16/2019 on evaluation today patient appears to be doing excellent with regard to his lower extremities the only area that is open is a new blister that can have opened as of today on the medial ankle on the left. Other than this he really seems to be doing great I see no major issues at this point. 06/23/2019 on evaluation today patient appears to be doing quite well with regard to his bilateral lower extremities. In fact he actually appears to be almost completely healed there is a small area of weeping noted of the right lower extremity just above the ankle. Nonetheless fortunately there is no signs of active infection at this time which is good news. No fevers, chills, nausea, vomiting, or diarrhea. 8/24; the patient arrived for a nurse visit today but complained of very significant pain in the left leg and therefore I was asked to look at this. Noted that he did not have an open area on the left leg last week nevertheless this was wrapped. The patient states that he is not been able to put his compression pumps on the left leg because of the discomfort. He has not been systemically unwell 06/30/2019  on evaluation today patient unfortunately despite being excellent last week is doing much worse with regard to his left lower extremity today. In fact he had to come in for a nurse on Monday where his left leg had to be rewrapped due to excessive weeping Dr. Leanord Hawking placed him on doxycycline at that point. Fortunately there is no signs of active infection Systemically at this time which is good news. 07/07/2019 in regard to the patient's wounds today he actually seems to be doing well with his right lower extremity there really is nothing open or draining at this point this is great news. Unfortunately the left lower extremity is given him additional trouble at this time. There does not appear to be any signs of active infection nonetheless he does have a lot of edema and swelling noted at this point as well as blistering all of which has led to a much more poor appearing leg at this time compared to where it was 2 weeks ago when it was almost completely healed. Obviously this is a little discouraging for the patient. He is try to contact the lymphedema clinic in [place] he has not been able to get through to them. 07/14/2019 on evaluation today patient actually appears to be doing slightly better with regard to his left lower extremity ulcers. Overall I do feel like at least at the top of the wrap that we have been placing this area has healed quite nicely and looks much better. The remainder of the leg is showing signs of improvement. Unfortunately in the thigh area he still has an open region on the left and again on the right he has been utilizing just a Band-Aid on an area that also opened on the thigh. Again this is an area that were not able to wrap although we did do an Ace wrap to provide some compression that something that obviously is a little less effective than the compression wraps we have been using on the lower portion of  the leg. He does have an appointment with the lymphedema clinic  in [facility] on Friday. 07/21/2019 on evaluation today patient appears to be doing better with regard to his lower extremity ulcers. He has been tolerating the dressing changes without complication. Fortunately there is no signs of active infection at this time. No fevers, chills, nausea, vomiting, or diarrhea. I did receive the paperwork from the physical therapist at the lymphedema clinic in [place]. Subsequently I signed off on that this morning and sent that back to him for further progression with the treatment plan. 07/28/2019 on evaluation today patient appears to be doing very well with regard to his right lower extremity where I do not see any open wounds at this point. Fortunately he is feeling great as far as that is concerned as well. In regard to the left lower extremity he has been having issues with still several areas of weeping and edema although the upper leg is doing better his lower leg still I think is going require the compression wrap at this time. No fevers, chills, nausea, vomiting, or diarrhea. 08/04/2019 on evaluation today patient unfortunately is having new wounds on the right lower extremity. Again we have been using Unna boot wrap on that side. We switched him to using his juxta lite wrap at home. With that being said he tells me he has been using it although his legs extremely swollen and to be honest really does not appear that he has been. I cannot know that for sure however. Nonetheless he has multiple new wounds on the right lower extremity at this time. Obviously we will have to see about getting this rewrapped for him today. 08/11/2019 on evaluation today patient appears to be doing fairly well with regard to his wounds. He has been tolerating the dressing changes including the compression wraps without complication. He still has a lot of edema in his upper thigh regions bilaterally he is supposed to be seeing the lymphedema clinic on the 15th of this month  once his wraps arrive for the upper part of his legs. 08/18/2019 on evaluation today patient appears to be doing well with regard to his bilateral lower extremities at this point. He has been tolerating the dressing changes without complication. Fortunately there is no signs of active infection which is also good news. He does have a couple weeping areas on the first and second toe of the right foot he also has just a small area on the left foot upper leg and a small area on the left lower leg but overall he is doing quite well in my opinion. He is supposed to be getting his wraps shortly in fact tomorrow and then subsequently is seeing the lymphedema clinic next Wednesday on the 21st. Of note he is also leaving on the 25th to go on vacation for a week to the beach. For that reason and since there is some uncertainty about what there can be doing at lymphedema clinic next Wednesday I am get a make an appointment for next Friday here for [place] to see what we need to do for him prior to him leaving for vacation. 10/23; patient arrives in considerable pain predominantly in the upper posterior calf just distal to the popliteal fossa also in the wound anteriorly above the major wound. This is probably cellulitis and he has had this recurrently in the past. He has no open wound on the right side and he has had an Radio broadcast assistant in that area. Finally I  note that he has an area on the left posterior calf which by enlarge is mostly epithelialized. This protrudes beyond the borders of the surrounding skin in the setting of dry scaly skin and lymphedema. The patient is leaving for [facility] on Sunday. Per his longstanding pattern, he will not take his compression pumps with him predominantly out of fear that they will be stolen. He therefore asked that we put a Unna boot back on the right leg. He will also contact the wound care center in [facility] to see if they can change his dressing in the mid week. 11/3;  patient returned from his vacation to [facility]. He was seen on 1 occasion at their wound care center. They did a 2 layer compression system as they did not have our 4-layer wrap. I am not completely certain what they put on the wounds. They did not change the Unna boot on the right. The patient is also seeing a lymphedema specialist physical therapist in [place]. It appears that he has some compression sleeve for his thighs which indeed look quite a bit better than I am used to seeing. He pumps over these with his external compression pumps. 11/10; the patient has a new wound on the right medial thigh otherwise there is no open areas on the right. He has an area on the left leg posteriorly anteriorly and medially and an area over the left second toe. We have been using silver alginate. He thinks the injury on his thigh is secondary to friction from the compression sleeve he has. 11/17; the patient has a new wound on the right medial thigh last week. He thinks this is because he did not have a underlying stocking for his thigh juxta lite apparatus. He now has this. The area is fairly large and somewhat angry but I do not think he has underlying cellulitis. ooHe has a intact blister on the right anterior tibial area. ooSmall wound on the right great toe dorsally ooSmall area on the medial left calf. 11/30; the patient does not have any open areas on his right leg and we did not take his juxta lite stocking off. However he states that on Friday his compression wrap fell down lodging around his upper mid calf area. As usual this creates a lot of problems for him. He called urgently today to be seen for a nurse visit however the nurse visit turned into a provider visit because of extreme erythema and pain in the left anterior tibia extending laterally and posteriorly. The area that is problematic is extensive 10/06/2019 upon evaluation today patient actually appears to be doing poorly in  regard to his left lower extremity. He Dr. Leanord Hawking did place him on doxycycline this past Monday apparently due to the fact that he was doing much worse in regard to this left leg. Fortunately the doxycycline does seem to be helping. Unfortunately we are still having a very difficult time getting his edema under any type of control in order to anticipate discharge at some point. The only way were really able to control his lymphedema really is with compression wraps and that has only even seemingly temporary. He has been seeing a lymphedema clinic they are trying to help in this regard but still this has been somewhat frustrating in general for the patient. 10/13/19 on evaluation today patient appears to be doing excellent with regard to his right lower extremity as far as the wounds are concerned. His swelling is still quite extensive  unfortunately. He is still having a lot of drainage from the thigh areas bilaterally which is unfortunate. He's been going to lymphedema clinic but again he still really does not have this edema under control as far as his lower extremities are concern. With regard to his left lower extremity this seems to be improving and I do believe the doxycycline has been of benefit for him. He is about to complete the doxycycline. 10/20/2019 on evaluation today patient appears to be doing poorly in regard to his bilateral lower extremities. More in the right thigh he has a lot of irritation at this site unfortunately. In regard to the left lower extremity the wrap was not quite as high it appears and does seem to have caused him some trouble as well. Fortunately there is no evidence of systemic infection though he does have some blue-green drainage which has me concerned for the possibility of Pseudomonas. He tells me he is previously taking Cipro without complications and he really does not care for Levaquin however due to some of the side effects he has. He is not allergic to any  medications specifically antibiotics that were aware of. 10/27/2019 on evaluation today patient actually does appear to be for the most part doing better when compared to last week's evaluation. With that being said he still has multiple open wounds over the bilateral lower extremities. He actually forgot to start taking the Cipro and states that he still has the whole bottle. He does have several new blisters on left lower extremity today I think I would recommend he go ahead and take the Cipro based on what I am seeing at this point. 12/30-Patient comes at 1 week visit, 4 layer compression wraps on the left and Unna boot on the right, primary dressing Xtrasorb and silver alginate. Patient is taking his Cipro and has a few more days left probably 5-6, and the legs are doing better. He states he is using his compressions devices which I believe he has 11/10/2019 on evaluation today patient actually appears to be much better than last time I saw him 2 weeks ago. His wounds are significantly improved and overall I am very pleased in this regard. Fortunately there is no signs of active infection at this time. He is just a couple of days away from completing Cipro. Overall his edema is much better he has been using his lymphedema pumps which I think is also helping at this point. 11/17/2019 on evaluation today patient appears to be doing excellent in regard to his wounds in general. His legs are swollen but not nearly as much as they have been in the past. Fortunately he is tolerating the compression wraps without complication. No fevers, chills, nausea, vomiting, or diarrhea. He does have some erythema however in the distal portion of his right lower extremity specifically around the forefoot and toes there is a little bit of warmth here as well. 11/24/2019 on evaluation today patient appears to be doing well with regard to his right lower extremity I really do not see any open wounds at this point. His  left lower extremity does have several open areas and his right medial thigh also is open. Other than this however overall the patient seems to be making good progress and I am very pleased at this point. 12/01/2019 on evaluation today patient appears to be doing poorly at this point in regard to his left lower extremity has several new blisters despite the fact that we have him in  compression wraps. In fact he had a 4-layer compression wrap, his upper thigh wrapped from lymphedema clinic, and a juxta light over top of the 4 layer compression wrap the lymphedema clinic applied and despite all this he still develop blisters underneath. Obviously this does have me concerned about the fact that unfortunately despite what we are doing to try to get wounds healed he continues to have new areas arise I do not think he is ever good to be at the point where he can realistically just use wraps at home to keep things under control. Typically when we heal him it takes about 1-2 days before he is back in the clinic with severe breakdown and blistering of his lower extremities bilaterally. This is happened numerous times in the past. Unfortunately I think that we may need some help as far as overall fluid overload to kind of limit what we are seeing and get things under better control. 12/08/2019 on evaluation today patient presents for follow-up concerning his ongoing bilateral lower extremity edema. Unfortunately he is still having quite a bit of swelling the compression wraps are controlling this to some degree but he did see Dr. [name] his cardiologist I do have that available for review today as far as the appointment was concerned that was on 12/06/2019. Obviously that she has been 2 days ago. The patient states that he is only been taking the Lasix 80 mg 1 time a day he had told me previously he was taking this twice a day. Nonetheless Dr. [name] recommended this be up to 80 mg 2 times a day for the patient as he  did appear to be fluid overloaded. With that being said the patient states he did this yesterday and he was unable to go anywhere or do anything due to the fact that he was constantly having to urinate. Nonetheless I think that this is still good to be something that is important for him as far as trying to get his edema under control at all things that he is going to be able to just expect his wounds to get under control and things to be better without going through at least a period of time where he is trying to stabilize his fluid management in general and I think increasing the Lasix is likely the first step here. It was also mentioned the possibility that the patient may require metolazone. With that being said he wanted to have the patient take Lasix twice a day first and then reevaluating 2 months to see where things stand. 12/15/2019 upon evaluation today patient appears to be doing regard to his legs although his toes are showing some signs of weeping especially on the left at this point to some degree on the right. There does not appear to be any signs of active infection and overall I do feel like the compression wraps are doing well for him but he has not been able to take the Lasix at home and the increased dose that Dr. [name] recommended. He tells me that just not go to be feasible for him. Nonetheless I think in this case he should probably send a message to Dr. [name] in order to discuss options from the standpoint of possible admission to get the fluid off or otherwise going forward. 12/22/2019 upon evaluation today patient appears to be doing fairly well with regard to his lower extremities at this point. In fact he would be doing excellent if it was not for the fact that  his right anterior thigh apparently had an allergic reaction to adhesive tape that he used. The wound itself that we have been monitoring actually appears to be healed. There is a lot of irritation at this  point. 12/29/2019 upon evaluation today patient appears to be doing well in regard to his lower extremities. His left medial thigh is open and somewhat draining today but this is the only region that is open the right has done much better with the treatment utilizing the steroid cream that I prescribed for him last week. Overall I am pleased in that regard. Fortunately there is no signs of active infection at this time. No fevers, chills, nausea, vomiting, or diarrhea. 01/05/2020 upon evaluation today patient appears to be doing more poorly in regard to his right lower extremity at this point upon evaluation today. Unfortunately he continues to have issues in this regard and I think the biggest issue is controlling his edema. This obviously is not very well controlled at this point is been recommended that he use the Lasix twice a day but he has not been able to do that. Unfortunately I think this is leading to an issue where honestly he is not really able to effectively control his edema and therefore the wounds really are not doing significantly better. I do not think that he is going to be able to keep things under good control unless he is able to control his edema much better. I discussed this again in great detail with him today. 01/12/2020 good news is patient actually appears to be doing quite well today at this point. He does have an appointment with lymphedema clinic tomorrow. His legs appear healed and the toe on the left is almost completely healed. In general I am very pleased with how things stand at this point. 01/19/2020 upon evaluation today patient appears to actually be doing well in regard to his lower extremities there is nothing open at this point. Fortunately he has done extremely well more recently. Has been seeing lymphedema clinic as well. With that being said he has Velcro wraps for his lower legs as well as his upper legs. The only wound really is on his toe which is the right  great toe and this is barely anything even there. With all that being said I think it is good to be appropriate today to go ahead and switch him over to the Velcro compression wraps. 01/26/2020 upon evaluation today patient appears to be doing worse with regard to his lower extremities after last week switch him to Velcro compression wraps. Unfortunately he lasted less than 24 hours he did not have the sock portion of his Velcro wrap on the left leg and subsequently developed a blister underneath the Velcro portion. Obviously this is not good and not what we were looking for at this point. He states the lymphedema clinic did tell him to wear the wrap for 23 hours and take him off for 1 I am okay with that plan but again right now we got a get things back under control again he may have some cellulitis noted as well. 02/02/2020 upon evaluation today patient unfortunately appears to have several areas of blistering on his bilateral lower extremities today mainly on the feet. His legs do seem to be doing somewhat better which is good news. Fortunately there is no evidence of active infection at this time. No fevers, chills, nausea, vomiting, or diarrhea. 02/16/2020 upon evaluation today patient appears to be doing well at  this time with regard to his legs. He has a couple weeping areas on his toes but for the most part everything is doing better and does appear to be sealed up on his legs which is excellent news. We can continue with wrapping him at this point as he had every time we discontinue the wraps he just breaks out with new wounds. There is really no point in is going forward with this at this point. 03/08/2020 upon evaluation today patient actually appears to be doing quite well with regard to his lower extremity ulcers. He has just a very superficial and really almost nonexistent blister on the left lower extremity he has in general done very well with the compression wraps. With that being said I  do not see any signs of infection at this time which is good news. 03/29/2020 upon evaluation today patient appears to be doing well with regard to his wounds currently except for where he had several new areas that opened up due to some of the wrap slipping and causing him trouble. He states he did not realize they had slipped. Nonetheless he has a 1 area on the right and 3 new areas on the left. Fortunately there is no signs of active infection at this time which is great news. 04/05/2020 upon evaluation today patient actually appears to be doing quite well in general in regard to his legs currently. Fortunately there is no signs of active infection at this time. No fevers, chills, nausea, vomiting, or diarrhea. He tells me next week that he will actually be seen in the lymphedema clinic on Thursday at 10 AM I see him on Wednesday next week. 04/12/2020 upon evaluation today patient appears to be doing very well with regard to his lower extremities bilaterally. In fact he does not appear to have any open wounds at this point which is good news. Fortunately there is no signs of active infection at this time. No fevers, chills, nausea, vomiting, or diarrhea. 04/19/2020 upon evaluation today patient appears to be doing well with regard to his wounds currently on the bilateral lower extremities. There does not appear to be any signs of active infection at this time. Fortunately there is no evidence of systemic infection and overall very pleased at this point. Nonetheless after I held him out last week he literally had blisters the next morning already which swelled up with him being right back here in the clinic. Overall I think that he is just not can be able to be discharged with his legs the way they are he is much to volume overloaded as far as fluid is concerned and that was discussed with him today of also discussed this but should try the clinic nurse manager as well as Dr. Leanord Hawking. 04/26/2020 upon  evaluation today patient appears to be doing better with regard to his wounds currently. He is making some progress and overall swelling is under good control with the compression wraps. Fortunately there is no evidence of active infection at this time. 05/10/2020 on evaluation today patient appears to be doing overall well in regard to his lower extremities bilaterally. He is Tolerating the compression wraps without complication and with what we are seeing currently I feel like that he is making excellent progress. There is no signs of active infection at this time. 05/24/2020 upon evaluation today patient appears to be doing well in regard to his legs. The swelling is actually quite a bit down compared to where it has been  in the past. Fortunately there is no sign of active infection at this time which is also good news. With that being said he does have several wounds on his toes that have opened up at this point. 05/31/2020 upon evaluation today patient appears to be doing well with regard to his legs bilaterally where he really has no significant fluid buildup at this point overall he seems to be doing quite well. Very pleased in this regard. With regard to his toes these also seem to be drying up which is excellent. We have continue to wrap him as every time we tried as a transition to the juxta light wraps things just do not seem to get any better. 06/07/2020 upon evaluation today patient appears to be doing well with regard to his right leg at this point. Unfortunately left leg has a lot of blistering he tells me the wrap started to slide down on him when he tried to put his other Velcro wrap over top of it to help keep things in order but nonetheless still had some issues. 06/14/2020 on evaluation today patient appears to be doing well with regard to his lower extremity ulcers and foot ulcers at this point. I feel like everything is actually showing signs of improvement which is great news overall  there is no signs of active infection at this time. No fevers, chills, nausea, vomiting, or diarrhea. 06/21/2020 on evaluation today patient actually appears to be doing okay in regard to his wounds in general. With that being said the biggest issue I see is on his right foot in particular the first and second toe seem to be doing a little worse due to the fact this is staying very wet. I think he is probably getting need to change out his dressings a couple times in between each week when we see him in regard to his toes in order to keep this drier based on the location and how this is proceeding. 06/28/2020 on evaluation today patient appears to be doing a little bit more poorly overall in regard to the appearance of the skin I am actually somewhat concerned about the possibility of him having a little bit of an infection here. We discussed the course of potentially giving him a doxycycline prescription which he is taken previously with good result. With that being said I do believe that this is potentially mild and at this point easily fixed. I just do not want anything to get any worse. 07/12/2020 upon evaluation today patient actually appears to be making some progress with regard to his legs which is great news there does not appear to be any evidence of active infection. Overall very pleased with where things stand. 07/26/2020 upon evaluation today patient appears to be doing well with regard to his leg ulcers and toe ulcers at this point. He has been tolerating the compression wraps without complication overall very pleased in this regard. 08/09/2020 upon evaluation today patient appears to be doing well with regard to his lower extremities bilaterally. Fortunately there is no signs of active infection overall I am pleased with where things stand. 08/23/2020 on evaluation today patient appears to be doing well with regard to his wound. He has been tolerating the dressing changes without  complication. Fortunately there is no signs of active infection at this time. Overall his legs seem to be doing quite well which is great news and I am very pleased in that regard. No fevers, chills, nausea, vomiting, or diarrhea. 09/13/2020  upon evaluation today patient appears to be doing okay in regard to his lower extremities. He does have a fairly large blister on the right leg which I did remove the blister tissue from today so we can get this to dry out other than that however he seems to be doing quite well. There is no signs of active infection at this time. 09/27/2020 upon evaluation today patient appears to actually be doing some better in regard to his right leg. Fortunately signs of active infection at this time which is great news. No fevers, chills, nausea, vomiting, or diarrhea. 10/04/2020 upon evaluation today patient actually appears to be showing signs of improvement which is great news with regard to his leg ulcers. Fortunately there is no signs of active infection which is great news he is still taking the antibiotics currently. No fevers, chills, nausea, vomiting, or diarrhea. 10/18/2020 on evaluation today patient appears to be doing well with regard to his legs currently. He has been tolerating the dressing changes including the wraps without complication. Fortunately there is no signs of active infection at this time. No fevers, chills, nausea, vomiting, or diarrhea. 10/25/2020 upon evaluation today patient appears to be doing decently well in regard to his wounds currently. He has been tolerating the dressing changes without complication. Overall I feel like he is making good progress albeit slow. Again this is something we can have to continue to wrap for some time to come most likely. 11/08/2020 upon evaluation today patient appears to be doing well with regard to his wounds currently. He has been tolerating the dressing changes without complication is not currently on any  antibiotics and he does not appear to show any signs of infection. He does continue to have a lot of drainage on the right leg not too severe but nonetheless this is very scattered. On the left leg this is looking to be much improved overall. 11/15/2020 upon evaluation today patient appears to be doing better with regard to his legs bilaterally. Especially the right leg which was much more significant last week. There does not appear to be any signs of active infection which is great news. No fevers, chills, nausea, vomiting, or diarrhea. 11/23/2019 upon evaluation today patient appears to be doing poorly still in regard to his lower extremities bilaterally. Unfortunately his right leg in particular appears to be doing much more poorly there is no signs really of infection this is not warm to touch but he does have a lot of drainage and weeping unfortunately. With that reason I do believe that we may need to initiate some treatment here to try to help calm down some of the swelling of the right leg. I think switching to a 4-layer compression wrap would be beneficial here. The patient is in agreement with giving this a try. 11/29/2020 upon evaluation today patient appears to be doing well currently in regard to his leg ulcers. I feel like the right leg is doing better he still has a lot of drainage but we do see some improvement here. The 4-layer compression wrap I think was helpful. 12/06/2020 upon evaluation today patient appears to be doing well with regard to his legs. In fact they seem to be doing about the best I have seen up to this point. Fortunately there is no signs of active infection at this time. No fevers, chills, nausea, vomiting, or diarrhea. 12/20/2020 upon evaluation today patient appears to be doing well at this time in regard to his legs. He  is not having any significant draining which is great news. Fortunately there is no signs of active infection at this time. No fevers, chills, nausea,  vomiting, or diarrhea. 01/17/2021 upon evaluation today Rob actually appears to be doing excellent in regard to his legs. He has a few areas again that come and go as far as his toes are concerned but overall this is doing quite well. 01/31/2021 upon evaluation today patient appears to be doing well with regard to his legs. Fortunately there does not appear to be any signs of active infection which is great news. Overall he is still having significant edema despite the compression wraps basically the 4-layer compression wrap to just keep things under control there is really not much room for play. 4/13: Mr. Dubose is a longstanding patient in our clinic and benefits greatly from weekly compression wraps. Today he has no complaints. He has been tolerating the wraps well. He states he is using the lymphedema pumps at home. 5/4; patient presents for follow-up of his chronic lymphedema/venous insufficiency ulcers. He comes weekly for compression wraps. He has no complaints today. He was unable to tolerate the Coflex 2 layer Last week so we will do the four press 4-layer compression. He has been using his lymphedema pumps daily. 5/18; patient presents for 2-week follow-up. He has no complaints or issues today. He has developed a new wound to the right foot on his fourth toe. He overall feels well and denies signs of infection. 6/1; patient presents for 2-week follow-up. He has no complaints or issues today. He denies signs of infection. 04/18/2021 upon evaluation today patient appears to be doing well with regard to his legs bilaterally. Family open wound is actually on the toe of his left foot everything else is completely closed which is great news. In general I am extremely pleased with where things stand at this point. The patient is also happy that things are doing so well. 05/02/2021 upon evaluation today patient's legs actually appear to be doing quite well today. Fortunately there does not appear  to be any signs of active infection which is great and overall I am extremely pleased with where he stands today. The patient does not appear to have any evidence of active infection at this time which is also great news. 05/09/2021 upon evaluation today patient appears to be doing a little bit more poorly in regard to his legs. Unfortunately he is having issues with some breakdown and a blood blister on the left leg this is due to I believe honestly to how it was wrapped last week. Fortunately there does not appear to be any signs of infection but nonetheless this is still a concern to be honest. No fevers, chills, nausea, vomiting, or diarrhea. 05/16/2021 upon evaluation today patient appears to be doing significantly better as compared to last week. I am very pleased with where things stand today. There does not appear to be any signs of infection which is great news and overall very pleased with where we stand. No fevers, chills, nausea, vomiting, or diarrhea. 05/30/2021 upon evaluation today patient appears to be doing well with regard to his legs. He has been tolerating the dressing changes without complication. Fortunately there does not appear to be any signs of active infection which is great news and overall I am extremely pleased with where things stand today. No fevers, chills, nausea, vomiting, or diarrhea. 06/20/2021 upon evaluation today patient actually appears to be making good progress today and very pleased  with what we are seeing. I think his legs are really maintaining. As long as we continue wrapping he seems to be doing excellent in my opinion. Fortunately there is no signs of active infection at this time. No fevers, chills, nausea, vomiting, or diarrhea. 07/11/2021 upon evaluation today patient actually appears to be making excellent progress at this time. Fortunately there does not appear to be any evidence of active infection which is great news and overall I am extremely pleased  with where things stand today. No fevers, chills, nausea, vomiting, or diarrhea. 07/25/2021 upon evaluation today patient appears to be doing well currently in regard to his lower extremities. He has been making good progress here and I do not see anything that is actually open significantly today this is great news. No fevers, chills, nausea, vomiting, or diarrhea. 08/08/2021 upon evaluation today patient appears to be doing well with regard to his wound. He has been tolerating the dressing changes without complication. With that being said unfortunately has a new area that opened up as far as his right posterior leg is concerned this was a blister he also has an area on the third toe right foot which also reopen. Fortunately there is no signs of active infection at this time which is great news. No fevers, chills, nausea, vomiting, or diarrhea. 10/17; patient came in today at his request initially for a nurse visit because it but out of concern for deterioration in both his lower legs and cellulitis I was asked to look at him. He comes in with increased swelling which he says started over the weekend he started to notice pain as well in his left medial ankle, right knee, left knee left dorsal foot. His wraps fell down contributing to some of this but he has not been using his compression pumps over the weekend for reasons that are not really clear. He comes in with multiple new wounds including the right posterior leg, right third toe, right fourth toe, left second toe left medial ankle left dorsal ankle and right anterior lower leg 09/19/2021 upon evaluation today patient does seem to be making improvements in general which is great news. I do not see any evidence of infection currently he does have some hypergranulation of the anterior portion of his ankle on the left side this is going require some debridement to pare this down and then subsequently silver nitrate probably due to the amount of  bleeding that he is probably going to experience. He is in agreement with this plan however. 09/26/2021 upon evaluation today although the patient's legs appear to be doing okay today unfortunately he did have maggots noted during the evaluation as well. This is again quite unfortunate with the respect to the fact that he feels like that he got the wrap wet which was noted today he is not sure when and I think this is what led to the issue. Nonetheless we can try to see what we can do about silly things off so that this does not happen again in the future although he is can have to be diligent about taking care of his wraps as well. 10/03/2021 upon evaluation today patient appears to be doing okay in regard to his legs currently. He unfortunately has maggots again noted on the left foot. This obviously is becoming quite an issue to be perfectly honest. I do think that he needs to see what can be done at home to try to limit this exposure. Last week we had  cleaned everything away so this is a new infestation as far as that is concerned. There is really not much that I can do to combat that I am trying to do what we can here in the clinic to wrap his foot and try to prevent any access of that again with knots there is a small this becomes very difficult. Objective Constitutional Obese and well-hydrated in no acute distress. Vitals Time Taken: 10:53 AM, Height: 70 in, Weight: 380.2 lbs, BMI: 54.5, Temperature: 98.0 F, Pulse: 67 bpm, Respiratory Rate: 20 breaths/min, Blood Pressure: 160/65 mmHg. Respiratory normal breathing without difficulty. Psychiatric this patient is able to make decisions and demonstrates good insight into disease process. Alert and Oriented x 3. pleasant and cooperative. General Notes: Upon inspection patient's wounds again showed signs for the most part of improvement which is good news I do not see any evidence of active infection also good news. There is no evidence of  active infection at this time also good news. He is also very stressed due to the fact that he has a court date on Monday trying to be able to stay in his home a bit longer until he can actually get into the new apartment. Integumentary (Hair, Skin) Wound #199 status is Open. Original cause of wound was Gradually Appeared. The date acquired was: 08/08/2021. The wound has been in treatment 8 weeks. The wound is located on the Right T Second. The wound measures 1.2cm length x 0.8cm width x 0.1cm depth; 0.754cm^2 area and 0.075cm^3 volume. There oe is Fat Layer (Subcutaneous Tissue) exposed. There is no tunneling or undermining noted. There is a medium amount of serous drainage noted. The wound margin is distinct with the outline attached to the wound base. There is large (67-100%) red, pink granulation within the wound bed. There is no necrotic tissue within the wound bed. General Notes: maceration present: maggots noted to right foot. Wound #200 status is Healed - Epithelialized. Original cause of wound was Gradually Appeared. The date acquired was: 08/20/2021. The wound has been in treatment 6 weeks. The wound is located on the Left,Medial Lower Leg. The wound measures 0cm length x 0cm width x 0cm depth; 0cm^2 area and 0cm^3 volume. There is a medium amount of serosanguineous drainage noted. Wound #201 status is Healed - Epithelialized. Original cause of wound was Gradually Appeared. The date acquired was: 08/20/2021. The wound has been in treatment 6 weeks. The wound is located on the Right,Anterior Lower Leg. The wound measures 0cm length x 0cm width x 0cm depth; 0cm^2 area and 0cm^3 volume. There is a large amount of serous drainage noted. Wound #202 status is Healed - Epithelialized. Original cause of wound was Gradually Appeared. The date acquired was: 08/20/2021. The wound has been in treatment 6 weeks. The wound is located on the Right T Fourth. The wound measures 0cm length x 0cm width x 0cm  depth; 0cm^2 area and 0cm^3 volume. oe There is a medium amount of serous drainage noted. Wound #203 status is Open. Original cause of wound was Gradually Appeared. The date acquired was: 08/20/2021. The wound has been in treatment 6 weeks. The wound is located on the Left,Anterior Ankle. The wound measures 0.6cm length x 1.1cm width x 0.1cm depth; 0.518cm^2 area and 0.052cm^3 volume. There is Fat Layer (Subcutaneous Tissue) exposed. There is no tunneling or undermining noted. There is a medium amount of serosanguineous drainage noted. The wound margin is distinct with the outline attached to the wound base. There is  large (67-100%) red, pink granulation within the wound bed. There is no necrotic tissue within the wound bed. Wound #204 status is Open. Original cause of wound was Gradually Appeared. The date acquired was: 09/26/2021. The wound has been in treatment 1 weeks. The wound is located on the Left T Second. The wound measures 2.3cm length x 3.5cm width x 0.1cm depth; 6.322cm^2 area and 0.632cm^3 volume. There oe is Fat Layer (Subcutaneous Tissue) exposed. There is no tunneling or undermining noted. There is a medium amount of serosanguineous drainage noted. The wound margin is flat and intact. There is large (67-100%) red granulation within the wound bed. There is no necrotic tissue within the wound bed. Wound #205 status is Open. Original cause of wound was Gradually Appeared. The date acquired was: 09/26/2021. The wound has been in treatment 1 weeks. The wound is located on the Left,Posterior Lower Leg. The wound measures 5cm length x 0.7cm width x 0.1cm depth; 2.749cm^2 area and 0.275cm^3 volume. There is Fat Layer (Subcutaneous Tissue) exposed. There is no tunneling or undermining noted. There is a medium amount of serosanguineous drainage noted. The wound margin is flat and intact. There is large (67-100%) pink granulation within the wound bed. There is no necrotic tissue within the  wound bed. Wound #206 status is Open. Original cause of wound was Gradually Appeared. The date acquired was: 10/03/2021. The wound is located on the Left,Dorsal Foot. The wound measures 1.1cm length x 3.5cm width x 0.1cm depth; 3.024cm^2 area and 0.302cm^3 volume. There is Fat Layer (Subcutaneous Tissue) exposed. There is no tunneling or undermining noted. There is a medium amount of serosanguineous drainage noted. The wound margin is distinct with the outline attached to the wound base. There is large (67-100%) pink, pale granulation within the wound bed. There is no necrotic tissue within the wound bed. Wound #207 status is Open. Original cause of wound was Gradually Appeared. The date acquired was: 10/03/2021. The wound is located on the Left T Third. oe The wound measures 2.5cm length x 1.6cm width x 0.4cm depth; 3.142cm^2 area and 1.257cm^3 volume. There is Fat Layer (Subcutaneous Tissue) exposed. There is no tunneling or undermining noted. There is a medium amount of serosanguineous drainage noted. The wound margin is distinct with the outline attached to the wound base. There is large (67-100%) red, pink, hyper - granulation within the wound bed. There is a small (1-33%) amount of necrotic tissue within the wound bed including Adherent Slough. 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 There was a Four Layer Compression Therapy Procedure by Shawn Stall, RN. Post procedure Diagnosis Wound #205: Same as Pre-Procedure There was a Four Layer Compression Therapy Procedure by Shawn Stall, RN. Post procedure Diagnosis Wound #: Same as Pre-Procedure Plan Follow-up Appointments: Return Appointment in 1 week. Leonard Schwartz Wednesday Other: - double the diuretic daily  for the week. Bathing/ Shower/ Hygiene: May shower with protection but do not get wound dressing(s) wet. Edema Control - Lymphedema / SCD / Other: Lymphedema Pumps. Use Lymphedema pumps on leg(s) 2-3 times a day for 45-60 minutes. If wearing any wraps or hose, do not remove them. Continue exercising as instructed. Elevate legs to the level of the heart or above for 30 minutes daily and/or when sitting, a frequency of: - throughout the day Avoid standing for long periods of time. Exercise regularly Other Edema Control Orders/Instructions: - right leg 4 layer compression with unna boot to secure to upper portion of lower leg. apply lotion to right leg. WOUND #199: - T Second Wound Laterality: Right oe Cleanser: Soap and Water 1 x Per Week/ Discharge Instructions: May shower and wash wound with dial antibacterial soap and water prior to dressing change. Cleanser: Wound Cleanser 1 x Per Week/ Discharge Instructions: Cleanse the wound with wound cleanser prior to applying a clean dressing using gauze sponges, not tissue or cotton balls. Peri-Wound Care: Zinc Oxide Ointment 30g tube 1 x Per Week/ Discharge Instructions: Apply Zinc Oxide to periwound with each dressing change Peri-Wound Care: Sween Lotion (Moisturizing lotion) 1 x Per Week/ Discharge Instructions: Apply moisturizing lotion as directed Prim Dressing: KerraCel Ag Gelling Fiber Dressing, 4x5 in (silver alginate) 1 x Per  Week/ ary Discharge Instructions: Apply silver alginate to wound bed as instructed Secured With: Coban Self-Adherent Wrap 4x5 (in/yd) 1 x Per Week/ Discharge Instructions: Secure with Coban apply lightly as directed. Secured With: Insurance underwriter, Sterile 2x75 (in/in) 1 x Per Week/ Discharge Instructions: Secure with stretch gauze as directed. WOUND #203: - Ankle Wound Laterality: Left, Anterior Cleanser: Soap and Water 1 x Per Week/ Discharge Instructions: May shower and wash wound with dial antibacterial soap and water prior to dressing change. Cleanser: Wound Cleanser 1 x Per Week/ Discharge Instructions: Cleanse the wound with wound cleanser prior to applying a clean dressing using gauze sponges, not tissue or cotton balls. Peri-Wound Care: Zinc Oxide Ointment 30g tube 1 x Per Week/ Discharge Instructions: Apply Zinc Oxide to periwound with each dressing change Peri-Wound Care: Sween Lotion (Moisturizing lotion) 1 x Per Week/ Discharge Instructions: Apply moisturizing lotion as directed Prim Dressing: KerraCel Ag Gelling Fiber Dressing, 4x5 in (silver alginate) 1 x Per Week/ ary Discharge Instructions: Apply silver alginate to wound bed as instructed Secondary Dressing: ABD Pad, 8x10 1 x Per Week/ Discharge Instructions: Apply over primary dressing as directed. Com pression Wrap: FourPress (4 layer compression wrap) 1 x Per Week/ Discharge Instructions: ****UNNA BOOT FIRST LAYER APPLIED TO UPPER PORTION OF LOWER LEG.*** WOUND #204: - T Second Wound Laterality: Left oe Cleanser: Soap and Water 1 x Per Week/ Discharge Instructions: May shower and wash wound with dial antibacterial soap and water prior to dressing change. Cleanser: Wound Cleanser 1 x Per Week/ Discharge Instructions: Cleanse the wound with wound cleanser prior to applying a clean dressing using gauze sponges, not tissue or cotton balls. Peri-Wound Care: Zinc Oxide Ointment 30g tube 1 x Per  Week/ Discharge Instructions: Apply Zinc Oxide to periwound with each dressing change Peri-Wound Care: Sween Lotion (Moisturizing lotion) 1 x Per Week/ Discharge Instructions: Apply moisturizing lotion as directed Prim Dressing: KerraCel Ag Gelling Fiber Dressing, 4x5 in (silver alginate) 1 x Per Week/ ary Discharge Instructions: Apply silver alginate to wound bed as instructed Secondary Dressing: gauze and zetuvit 1 x Per Week/ Discharge Instructions: apply gauze between the toes and apply zetuvit pads  over the toes secure with the compression wrap to dorsal foot to aid in drainage. Secured With: Coban Self-Adherent Wrap 4x5 (in/yd) 1 x Per Week/ Discharge Instructions: Secure with Coban apply lightly as directed. Secured With: Insurance underwriter, Sterile 2x75 (in/in) 1 x Per Week/ Discharge Instructions: Secure with stretch gauze as directed. WOUND #205: - Lower Leg Wound Laterality: Left, Posterior Cleanser: Soap and Water 1 x Per Week/ Discharge Instructions: May shower and wash wound with dial antibacterial soap and water prior to dressing change. Cleanser: Wound Cleanser 1 x Per Week/ Discharge Instructions: Cleanse the wound with wound cleanser prior to applying a clean dressing using gauze sponges, not tissue or cotton balls. Peri-Wound Care: Zinc Oxide Ointment 30g tube 1 x Per Week/ Discharge Instructions: Apply Zinc Oxide to periwound with each dressing change Peri-Wound Care: Sween Lotion (Moisturizing lotion) 1 x Per Week/ Discharge Instructions: Apply moisturizing lotion as directed Prim Dressing: KerraCel Ag Gelling Fiber Dressing, 4x5 in (silver alginate) 1 x Per Week/ ary Discharge Instructions: Apply silver alginate to wound bed as instructed Secondary Dressing: ABD Pad, 8x10 1 x Per Week/ Discharge Instructions: Apply over primary dressing as directed. Com pression Wrap: FourPress (4 layer compression wrap) 1 x Per Week/ Discharge Instructions: ****UNNA  BOOT FIRST LAYER APPLIED TO UPPER PORTION OF LOWER LEG.*** WOUND #206: - Foot Wound Laterality: Dorsal, Left Cleanser: Soap and Water 1 x Per Week/ Discharge Instructions: May shower and wash wound with dial antibacterial soap and water prior to dressing change. Cleanser: Wound Cleanser 1 x Per Week/ Discharge Instructions: Cleanse the wound with wound cleanser prior to applying a clean dressing using gauze sponges, not tissue or cotton balls. Peri-Wound Care: Zinc Oxide Ointment 30g tube 1 x Per Week/ Discharge Instructions: Apply Zinc Oxide to periwound with each dressing change Peri-Wound Care: Sween Lotion (Moisturizing lotion) 1 x Per Week/ Discharge Instructions: Apply moisturizing lotion as directed Prim Dressing: KerraCel Ag Gelling Fiber Dressing, 4x5 in (silver alginate) 1 x Per Week/ ary Discharge Instructions: Apply silver alginate to wound bed as instructed Secondary Dressing: ABD Pad, 8x10 1 x Per Week/ Discharge Instructions: Apply over primary dressing as directed. Com pression Wrap: FourPress (4 layer compression wrap) 1 x Per Week/ Discharge Instructions: ****UNNA BOOT FIRST LAYER APPLIED TO UPPER PORTION OF LOWER LEG.*** WOUND #207: - T Third Wound Laterality: Left oe Cleanser: Soap and Water 1 x Per Week/ Discharge Instructions: May shower and wash wound with dial antibacterial soap and water prior to dressing change. Cleanser: Wound Cleanser 1 x Per Week/ Discharge Instructions: Cleanse the wound with wound cleanser prior to applying a clean dressing using gauze sponges, not tissue or cotton balls. Peri-Wound Care: Zinc Oxide Ointment 30g tube 1 x Per Week/ Discharge Instructions: Apply Zinc Oxide to periwound with each dressing change Peri-Wound Care: Sween Lotion (Moisturizing lotion) 1 x Per Week/ Discharge Instructions: Apply moisturizing lotion as directed Prim Dressing: KerraCel Ag Gelling Fiber Dressing, 4x5 in (silver alginate) 1 x Per Week/ ary Discharge  Instructions: Apply silver alginate to wound bed as instructed Secured With: Coban Self-Adherent Wrap 4x5 (in/yd) 1 x Per Week/ Discharge Instructions: Secure with Coban apply lightly as directed. Secured With: Insurance underwriter, Sterile 2x75 (in/in) 1 x Per Week/ Discharge Instructions: Secure with stretch gauze as directed. 1. Would recommend currently that we go ahead and continue with the wound care measures as before this includes the silver alginate dressing. 2. I am also can recommend that we continue with the  compression wrapping which I think is doing decently well for the patient. Regular try to seal off the foot area little bit better so hopefully he does not have a repeat of the issue with maggots going forward. We will see patient back for reevaluation in 1 week here in the clinic. If anything worsens or changes patient will contact our office for additional recommendations. Electronic Signature(s) Signed: 10/03/2021 5:51:56 PM By: Lenda Kelp PA-C Entered By: Lenda Kelp on 10/03/2021 17:51:56 -------------------------------------------------------------------------------- SuperBill Details Patient Name: Date of Service: [name], Trevonn J. 10/03/2021 Medical Record Number: [id number] Patient Account Number: [id number] Date of Birth/Sex: Treating RN: [birth year] (70 y.o. 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 L97.812 Non-pressure chronic ulcer of other part of right lower leg with fat layer  exposed I87.333 Chronic venous hypertension (idiopathic) 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

## 2021-10-03 NOTE — Telephone Encounter (Signed)
Med pending PCP approval

## 2021-10-03 NOTE — Telephone Encounter (Signed)
Requesting: FIORICET W/ CODEINE Contract: 07/3021- NORCO UDS: N/A Last Visit:09/19/21 Next Visit: 10/10/21 Last Refill: 01/03/21(60,0)  Please Advise. Med pending

## 2021-10-10 ENCOUNTER — Ambulatory Visit (INDEPENDENT_AMBULATORY_CARE_PROVIDER_SITE_OTHER): Payer: Medicare Other | Admitting: Family Medicine

## 2021-10-10 ENCOUNTER — Encounter (HOSPITAL_BASED_OUTPATIENT_CLINIC_OR_DEPARTMENT_OTHER): Payer: Medicare Other | Attending: Physician Assistant | Admitting: Physician Assistant

## 2021-10-10 ENCOUNTER — Encounter: Payer: Self-pay | Admitting: Family Medicine

## 2021-10-10 ENCOUNTER — Other Ambulatory Visit: Payer: Self-pay

## 2021-10-10 VITALS — BP 123/56 | HR 72 | Temp 97.7°F | Ht 70.0 in | Wt 392.2 lb

## 2021-10-10 DIAGNOSIS — L97521 Non-pressure chronic ulcer of other 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 of skin: Secondary | ICD-10-CM | POA: Diagnosis not present

## 2021-10-10 DIAGNOSIS — I87333 Chronic venous hypertension (idiopathic) with ulcer and inflammation of bilateral lower extremity: Secondary | ICD-10-CM | POA: Insufficient documentation

## 2021-10-10 DIAGNOSIS — E11622 Type 2 diabetes mellitus with other skin ulcer: Secondary | ICD-10-CM | POA: Diagnosis not present

## 2021-10-10 DIAGNOSIS — L97522 Non-pressure chronic ulcer of other part of left foot with fat layer exposed: Secondary | ICD-10-CM | POA: Diagnosis not present

## 2021-10-10 DIAGNOSIS — L97829 Non-pressure chronic ulcer of other part of left lower leg with unspecified severity: Secondary | ICD-10-CM | POA: Diagnosis not present

## 2021-10-10 DIAGNOSIS — Z794 Long term (current) use of insulin: Secondary | ICD-10-CM | POA: Diagnosis not present

## 2021-10-10 DIAGNOSIS — N1831 Chronic kidney disease, stage 3a: Secondary | ICD-10-CM | POA: Diagnosis not present

## 2021-10-10 DIAGNOSIS — L03115 Cellulitis of right lower limb: Secondary | ICD-10-CM | POA: Insufficient documentation

## 2021-10-10 DIAGNOSIS — E7800 Pure hypercholesterolemia, unspecified: Secondary | ICD-10-CM | POA: Diagnosis not present

## 2021-10-10 DIAGNOSIS — L97519 Non-pressure chronic ulcer of other part of right foot with unspecified severity: Secondary | ICD-10-CM | POA: Diagnosis not present

## 2021-10-10 DIAGNOSIS — F4323 Adjustment disorder with mixed anxiety and depressed mood: Secondary | ICD-10-CM

## 2021-10-10 DIAGNOSIS — I10 Essential (primary) hypertension: Secondary | ICD-10-CM

## 2021-10-10 DIAGNOSIS — L03116 Cellulitis of left lower limb: Secondary | ICD-10-CM | POA: Diagnosis not present

## 2021-10-10 DIAGNOSIS — L97812 Non-pressure chronic ulcer of other part of right lower leg with fat layer exposed: Secondary | ICD-10-CM | POA: Insufficient documentation

## 2021-10-10 DIAGNOSIS — L97512 Non-pressure chronic ulcer of other part of right foot with fat layer exposed: Secondary | ICD-10-CM | POA: Diagnosis not present

## 2021-10-10 DIAGNOSIS — N2889 Other specified disorders of kidney and ureter: Secondary | ICD-10-CM

## 2021-10-10 DIAGNOSIS — E1140 Type 2 diabetes mellitus with diabetic neuropathy, unspecified: Secondary | ICD-10-CM | POA: Insufficient documentation

## 2021-10-10 DIAGNOSIS — N183 Chronic kidney disease, stage 3 unspecified: Secondary | ICD-10-CM

## 2021-10-10 DIAGNOSIS — L97322 Non-pressure chronic ulcer of left ankle with fat layer exposed: Secondary | ICD-10-CM | POA: Diagnosis not present

## 2021-10-10 DIAGNOSIS — I890 Lymphedema, not elsewhere classified: Secondary | ICD-10-CM | POA: Diagnosis not present

## 2021-10-10 DIAGNOSIS — E1122 Type 2 diabetes mellitus with diabetic chronic kidney disease: Secondary | ICD-10-CM

## 2021-10-10 LAB — COMPREHENSIVE METABOLIC PANEL
ALT: 11 U/L (ref 0–53)
AST: 13 U/L (ref 0–37)
Albumin: 3.6 g/dL (ref 3.5–5.2)
Alkaline Phosphatase: 81 U/L (ref 39–117)
BUN: 29 mg/dL — ABNORMAL HIGH (ref 6–23)
CO2: 28 mEq/L (ref 19–32)
Calcium: 8.7 mg/dL (ref 8.4–10.5)
Chloride: 100 mEq/L (ref 96–112)
Creatinine, Ser: 1.73 mg/dL — ABNORMAL HIGH (ref 0.40–1.50)
GFR: 39.5 mL/min — ABNORMAL LOW (ref >60.00)
Glucose, Bld: 184 mg/dL — ABNORMAL HIGH (ref 70–99)
Potassium: 4.6 mEq/L (ref 3.5–5.1)
Sodium: 137 mEq/L (ref 135–145)
Total Bilirubin: 0.6 mg/dL (ref 0.2–1.2)
Total Protein: 6.7 g/dL (ref 6.0–8.3)

## 2021-10-10 LAB — HEMOGLOBIN A1C: Hgb A1c MFr Bld: 7.9 % — ABNORMAL HIGH (ref 4.6–6.5)

## 2021-10-10 MED ORDER — ISOSORBIDE MONONITRATE ER 30 MG PO TB24: 30.0000 mg | ORAL_TABLET | Freq: Every day | ORAL | 3 refills | Status: DC

## 2021-10-10 NOTE — Progress Notes (Addendum)
GLENWOOD, REVOIR ([id number]) Visit Report for 10/10/2021 Chief Complaint Document Details Patient Name: Date of Service: CO TARVARES, LANT 10/10/2021 10:30 A M Medical Record Number: [id number] Patient Account Number: [id number] Date of Birth/Sex: Treating RN: August 06, 1951 (70 y.o. [name] Primary Care Provider: Nicoletta Ba Other Clinician: Referring Provider: Treating Provider/Extender: Adele Dan in Treatment: 298 Information Obtained from: Patient Chief Complaint Patient is here for evaluation of venous/lymphedema ulcers Electronic Signature(s) Signed: 10/10/2021 10:56:11 AM By: Lenda Kelp PA-C Entered By: Lenda Kelp on 10/10/2021 10:56:10 -------------------------------------------------------------------------------- HPI Details Patient Name: Date of Service: [name], Antonious J. 10/10/2021 10:30 A M Medical Record Number: [id number] Patient Account Number: [id number] Date of Birth/Sex: Treating RN: [birth year] (70 y.o. [name] Primary Care Provider: Nicoletta Ba Other Clinician: Referring Provider: Treating Provider/Extender: Adele Dan in Treatment: 298 History of Present Illness HPI Description: Referred by PCP for consultation. Patient has long standing history of BLE venous stasis, no prior ulcerations. At beginning of month, developed cellulitis and weeping. Received IM Rocephin followed by Keflex and resolved. Wears compression stocking, appr 6 months old. Not sure strength. No present drainage. 01/22/16 this is a patient who is a type II diabetic on insulin. 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 He tells me he religiously wears pressure stockings of uncertain strength. He was here with weeping edema about 8 months ago but did not have an open wound. Roughly a month ago he had a reopening on his bilateral legs. He is been using bandages and Neosporin. He  does not complain of pain. 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 Last BUN/creatinine I have is from 11/20/15 at 13 and 1.0 respectively 01/29/16; patient arrives today having tolerated the Profore wrap. He brought in his stockings and these are 18 mmHg stockings he bought from [employer]. The compression here is likely inadequate. He does not complain of pain or excessive drainage she has no systemic symptoms. The wound on the right looks improved as does the one on the left although one on the left is more substantial with still tissue at risk below the actual wound area on the bilateral posterior calf 02/05/16; patient arrives with poor edema control. He states that we did put a 4 layer compression on it last week. No weight appear 5 this. 02/12/16; the area on the posterior right Has healed. The left Has a substantial wound that has necrotic surface eschar that requires a debridement with a curette. 02/16/16;the patient called or a Nurse visit secondary to increased swelling. He had been in earlier in the week with his right leg healed. He was transitioned to is on pressure stocking on the right leg with the only open wound on the left, a substantial area on the left posterior calf. Note he has a history of severe lower extremity edema, he has a history of chronic atrial fibrillation but not heart failure per my notes but I'll need to research this. He is not complaining of chest pain shortness of breath or orthopnea. The intake nurse noted blisters on the previously closed right leg 02/19/16; this is the patient's regular visit day. I see him on Friday with escalating edema new wounds on the right leg and clear signs of at least right ventricular heart failure. I increased his Lasix to 40 twice a  day. He is returning currently in follow-up. States he is noticed a decrease in that the  edema 02/26/16 patient's legs have much less edema. There is nothing really open on the right leg. The left leg has improved condition of the large superficial wound on the posterior left leg 03/04/16; edema control is very much better. The patient's right leg wounds have healed. On the left leg he continues to have severe venous inflammation on the posterior aspect of the left leg. There is no tenderness and I don't think any of this is cellulitis. 03/11/16; patient's right leg is married healed and he is in his own stocking. The patient's left leg has deteriorated somewhat. There is a lot of erythema around the wound on the posterior left leg. There is also a significant rim of erythema posteriorly just above where the wrap would've ended there is a new wound in this location and a lot of tenderness. Can't rule out cellulitis in this area. 03/15/16; patient's right leg remains healed and he is in his own stocking. The patient's left leg is much better than last review. His major wound on the posterior aspect of his left Is almost fully epithelialized. He has 3 small injuries from the wraps. Really. Erythema seems a lot better on antibiotics 03/18/16; right leg remains healed and he is in his own stocking. The patient's left leg is much better. The area on the posterior aspect of the left calf is fully epithelialized. His 3 small injuries which were wrap injuries on the left are improved only one seems still open his erythema has resolved 03/25/16; patient's right leg remains healed and he is in his own stocking. There is no open area today on the left leg posterior leg is completely closed up. His wrap injuries at the superior aspect of his leg are also resolved. He looks as though he has some irritation on the dorsal ankle but this is fully epithelialized without evidence of infection. 03/28/16; we discharged this patient on Monday. Transitioned him into his own stocking. There were problems almost  immediately with uncontrolled swelling weeping edema multiple some of which have opened. He does not feel systemically unwell in particular no chest pain no shortness of breath and he does not feel 04/08/16; the edema is under better control with the Profore light wrap but he still has pitting edema. There is one large wound anteriorly 2 on the medial aspect of his left leg and 3 small areas on the superior posterior calf. Drainage is not excessive he is tolerating a Profore light well 04/15/16; put a Profore wrap on him last week. This is controlled is edema however he had a lot of pain on his left anterior foot most of his wounds are healed 04/22/16 once again the patient has denuded areas on the left anterior foot which he states are because his wrap slips up word. He saw his primary physician today is on Lasix 40 twice a day and states that he his weight is down 20 pounds over the last 3 months. 04/29/16: Much improved. left anterior foot much improved. He is now on Lasix 80 mg per day. Much improved edema control 05/06/16; I was hoping to be able to discharge him today however once again he has blisters at a low level of where the compression was placed last week mostly on his left lateral but also his left medial leg and a small area on the anterior part of the left foot. 05/09/16; apparently the patient  went home after his appointment on 7/4 later in the evening developing pain in his upper medial thigh together with subjective fever and chills although his temperature was not taken. The pain was so intense he felt he would probably have to call 911. However he then remembered that he had leftover doxycycline from a previous round of antibiotics and took these. By the next morning he felt a lot better. He called and spoke to one of our nurses and I approved doxycycline over the phone thinking that this was in relation to the wounds we had previously seen although they were definitely were not. The  patient feels a lot better old fever no chills he is still working. Blood sugars are reasonably controlled 05/13/16; patient is back in for review of his cellulitis on his anterior medial upper thigh. He is taking doxycycline this is a lot better. Culture I did of the nodular area on the dorsal aspect of his foot grew MRSA this also looks a lot better. 05/20/16; the patient is cellulitis on the medial upper thigh has resolved. All of his wound areas including the left anterior foot, areas on the medial aspect of the left calf and the lateral aspect of the calf at all resolved. He has a new blister on the left dorsal foot at the level of the fourth toe this was excised. No evidence of infection 05/27/16; patient continues to complain weeping edema. He has new blisterlike wounds on the left anterior lateral and posterior lateral calf at the top of his wrap levels. The area on his left anterior foot appears better. He is not complaining of fever, pain or pruritus in his feet. 05/30/16; the patient's blisters on his left anterior leg posterior calf all look improved. He did not increase the Lasix 100 mg as I suggested because he was going to run out of his 40 mg tablets. He is still having weeping edema of his toes 06/03/16; I renewed his Lasix at 80 mg once a day as he was about to run out when I last saw him. He is on 80 mg of Lasix now. I have asked him to cut down on the excessive amount of water he was drinking and asked him to drink according to his thirst mechanisms 06/12/2016 -- was seen 2 days ago and was supposed to wear his compression stockings at home but he is developed lymphedema and superficial blisters on the left lower extremity and hence came in for a review 06/24/16; the remaining wound is on his left anterior leg. He still has edema coming from between his toes. There is lymphedema here however his edema is generally better than when I last saw this. He has a history of atrial fibrillation  but does not have a known history of congestive heart failure nevertheless I think he probably has this at least on a diastolic basis. 07/01/16 I reviewed his echocardiogram from January 2017. This was essentially normal. He did not have LVH, EF of 55-60%. His right ventricular function was normal although he did have trivial tricuspid and pulmonic regurgitation. This is not audible on exam however. I increased his Lasix to do massive edema in his legs well above his knees I think in early July. He was also drinking an excessive amount of water at the time. 07/15/16; missed his appointment last week because of the Labor Day holiday on Monday. He could not get another appointment later in the week. Started to feel the wrap digging in superiorly  so we remove the top half and the bottom half of his wrap. He has extensive erythema and blistering superiorly in the left leg. Very tender. Very swollen. Edema in his foot with leaking edema fluid. He has not been systemically unwell 07/22/16; the area on the left leg laterally required some debridement. The medial wounds look more stable. His wrap injury wounds appear to have healed. Edema and his foot is better, weeping edema is also better. He tells me he is meeting with the supplier of the external compression pumps at work 08/05/16; [place] His wrap is been on for an extended period of time. Also over the weekend he developed an extensive area of tender erythema across his anterior medial thigh. He took to doxycycline yesterday that he had leftover from a previous prescription. The patient complains of weeping edema coming out of his toes 08/08/16; I saw this patient on 10/2. He was tender across his anterior thigh. I put him on doxycycline. He returns today in follow-up. He does not have any open wounds on his lower leg, he still has edema weeping into his toes. 08/12/16; patient was seen back urgently today to  follow-up for his extensive left thigh cellulitis/erysipelas. He comes back with a lot less swelling and erythema pain is much better. I believe I gave him Augmentin and Cipro. His wrap was cut down as he stated a roll down his legs. He developed blistering above the level of the wrap that remained. He has 2 open blisters and 1 intact. 08/19/16; patient is been doing his primary doctor who is increased his Lasix from 40-80 once a day or 80 already has less edema. Cellulitis has remained improved in the left thigh. 2 open areas on the posterior left calf 08/26/16; he returns today having new open blisters on the anterior part of his left leg. He has his compression pumps but is not yet been shown how to use some vital representative from the supplier. 09/02/16 patient returns today with no open wounds on the left leg. Some maceration in his plantar toes 09/10/2016 -- Dr. Leanord Hawkingobson had recently discharged him on 09/02/2016 and he has come right back with redness swelling and some open ulcers on his left lower extremity. He says this was caused by trying to apply his compression stockings and he's been unable to use this and has not been able to use his lymphedema pumps. He had some doxycycline leftover and he has started on this a few days ago. 09/16/16; there are no open wounds on his leg on the left and no evidence of cellulitis. 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 He does not complain of symptoms here. I am not clear use using his external compression pumps. 09/23/16; I have not seen this patient in 2 weeks. He canceled his appointment 10 days ago as he was going on vacation. He tells me that on Monday he noticed a large area on his posterior left leg which is been draining copiously and is reopened into a large wound. He is been using ABDs and the external part of his juxtalite, according to our nurse this was not on properly. 10/07/16;  Still a substantial area on the posterior left leg. Using silver alginate 10/14/16; in general better although there is still open area which looks healthy. Still using silver alginate. He reminds me that this happen [place] T oday while he was showering in the morning. He had been using his juxtalite's 10/21/16; the area on his posterior left leg is fully epithelialized. However he arrives today with a large area of tender erythema in his medial and posterior left thigh just above the knee. I have marked the area. Once again he is reluctant to consider hospitalization. I treated him with oral antibiotics in the past for a similar situation with resolution I think with doxycycline however this area it seems more extensive to me. He is not complaining of fever but does have chills and says states he is thirsty. His blood sugar today was in the 140s at home 10/25/16 the area on his posterior left leg is fully epithelialized although there is still some weeping edema. The large area of tenderness and erythema in his medial and posterior left thigh is a lot less tender although there is still a lot of swelling in this thigh. He states he feels a lot better. He is on doxycycline and Augmentin that I started last week. This will continued until Tuesday, December 26. I have ordered a duplex ultrasound of the left thigh rule out DVT whether there is an abscess something that would need to be drained I would also like to know. 11/01/16; he still has weeping edema from a not fully epithelialized area on his left posterior calf. Most of the rest of this looks a lot better. He has completed his antibiotics. His thigh is a lot better. Duplex ultrasound did not show a DVT in the thigh 11/08/16; he comes in today with more Denuded surface epithelium from the posterior aspect of his calf. There is no real evidence of cellulitis. The superior aspect of his wrap appears to have put quite an  indentation in his leg just below the knee and this may have contributed. He does not complain of pain or fever. We have been using silver alginate as the primary dressing. The area of cellulitis in the right thigh has totally resolved. He has been using his compression stockings once a week 11/15/16; the patient arrives today with more loss of epithelium from the posterior aspect of his left calf. He now has a fairly substantial wound in this area. The reason behind this deterioration isn't exactly clear although his edema is not well controlled. He states he feels he is generally more swollen systemically. He is not complaining of chest pain shortness of breath fever. T me he has an appointment with his primary physician in early February. He is on 80 mg of oral ells Lasix a day. He claims compliance with the external compression pumps. He is not having any pain in his legs similar to what he has with his recurrent cellulitis 11/22/16; the patient arrives a follow-up of his large area on his left lateral calf. This looks somewhat better today. He came in earlier in the week for a dressing change since I saw him a week ago. He is not complaining of any pain no shortness of breath no chest pain 11/28/16; the patient arrives for follow-up of his large area on the left lateral calf this does not look better. In fact it is larger weeping edema. The surface of the wound does not look too bad. We have been using silver alginate although I'm not certain that this is a dressing issue. 12/05/16; again the patient follows up for a large wound on the left lateral and left posterior calf this does not look better. There  continues to be weeping edema necrotic surface tissue. More worrisome than this once again there is erythema below the wound involving the distal Achilles and heel suggestive of cellulitis. He is on his feet working most of the day of this is not going well. We are changing his dressing twice a week to  facilitate the drainage. 12/12/16; not much change in the overall dimensions of the large area on the left posterior calf. This is very inflamed looking. I gave him an. Doxycycline last week does not really seem to have helped. He found the wrap very painful indeed it seems to of dog into his legs superiorly and perhaps around the heel. He came in early today because the drainage had soaked through his dressings. 12/19/16- patient arrives for follow-up evaluation of his left lower extremity ulcers. He states that he is using his lymphedema pumps once daily when there is "no drainage". He admits to not using his lipedema pumps while under current treatment. His blood sugars have been consistently between 150-200. 12/26/16; the patient is not using his compression pumps at home because of the wetness on his feet. I've advised him that I think it's important for him to use this daily. He finds his feet too wet, he can put a plastic bag over his legs while he is in the pumps. Otherwise I think will be in a vicious circle. We are using silver alginate to the major area on his left posterior calf 01/02/17; the patient's posterior left leg has further of all into 3 open wounds. All of them covered with a necrotic surface. He claims to be using his compression pumps once a day. His edema control is marginal. Continue with silver alginate 01/10/17; the patient's left posterior leg actually looks somewhat better. There is less edema, less erythema. Still has 3 open areas covered with a necrotic surface requiring debridement. He claims to be using his compression pumps once a day his edema control is better 01/17/17; the patient's left posterior calf look better last week when I saw him and his wrap was changed 2 days ago. He has noted increasing pain in the left heel and arrives today with much larger wounds extensive erythema extending down into the entire heel area especially tender medially. He is not  systemically unwell CBGs have been controlled no fever. Our intake nurse showed me limegreen drainage on his AVD pads. 01/24/17; his usual this patient responds nicely to antibiotics last week giving him Levaquin for presumed Pseudomonas. The whole entire posterior part of his leg is much better much less inflamed and in the case of his Achilles heel area much less tender. He has also had some epithelialization posteriorly there are still open areas here and still draining but overall considerably better 01/31/17- He has continue to tolerate the compression wraps. he states that he continues to use the lymphedema pumps daily, and can increase to twice daily on the weekends. He is voicing no complaints or concerns regarding his LLE ulcers 02/07/17-he is here for follow-up evaluation. He states that he noted some erythema to the left medial and anterior thigh, which he states is new as of yesterday. He is concerned about recurrent cellulitis. He states his blood sugars have been slightly elevated, this morning in the 180s 02/14/17; he is here for follow-up evaluation. When he was last here there was erythema superiorly from his posterior wound in his anterior thigh. He was prescribed Levaquin however a culture of the wound surface grew MRSA over  the phone I changed him to doxycycline on Monday and things seem to be a lot better. 02/24/17; patient missed his appointment on Friday therefore we changed his nurse visit into a physician visit today. Still using silver alginate on the large area of the posterior left thigh. He isn't new area on the dorsal left second toe 03/03/17; actually better today although he admits he has not used his external compression pumps in the last 2 days or so because of work responsibilities over the weekend. 03/10/17; continued improvement. External compression pumps once a day almost all of his wounds have closed on the posterior left calf. Better edema control 03/17/17; in general  improved. He still has 3 small open areas on the lateral aspect of his left leg however most of the area on the posterior part of his leg is epithelialized. He has better edema control. He has an ABD pad under his stocking on the right anterior lower leg although he did not let us look at that today. 03/24/17; patient arrives back in clinic today with no open areas however there are areas on the posterior left calf and anterior left calf that are less than 100% epithelialized. His edema is well controlled in the left lower leg. There is some pitting edema probably lymphedema in the left upper thigh. He uses compression pumps at home once per day. I tried to get him to do this twice a day although he is very reticent. 04/01/2017 -- for the last 2 days he's had significant redness, tenderness and weeping and came in for an urgent visit today. 04/07/17; patient still has 6 more days of doxycycline. He was seen by Dr. Meyer Russel last Wednesday for cellulitis involving the posterior aspect, lateral aspect of his Involving his heel. For the most part he is better there is less erythema and less weeping. He has been on his feet for 12 hours 2 over the weekend. Using his compression pumps once a day 04/14/17 arrives today with continued improvement. Only one area on the posterior left calf that is not fully epithelialized. He has intense bilateral venous inflammation associated with his chronic venous insufficiency disease and secondary lymphedema. We have been using silver alginate to the left posterior calf wound In passing he tells [place] today that the right leg but we have not seen in quite some time has an open area on it but he doesn't want [place] to look at this today states he will show this to [place] next week. 04/21/17; there is no open area on his left leg although he still reports some weeping edema. He showed [place] his right leg today which is the first time we've seen this leg in a long time. He has a large area of  open wound on the right leg anteriorly healthy granulation. Quite a bit of swelling in the right leg and some degree of venous inflammation. He told us about the right leg in passing last week but states that deterioration in the right leg really only happened over the weekend 04/28/17; there is no open area on the left leg although there is an irritated part on the posterior which is like a wrap injury. The wound on the right leg which was new from last week at least to [place] is a lot better. 05/05/17; still no open area on the left leg. Patient is using his new compression stocking which seems to be doing a good job of controlling the edema. He states he is using his  compression pumps once per day. The right leg still has an open wound although it is better in terms of surface area. Required debridement. A lot of pain in the posterior right Achilles marked tenderness. Usually this type of presentation this patient gives concern for an active cellulitis 05/12/17; patient arrives today with his major wound from last week on the right lateral leg somewhat better. Still requiring debridement. He was using his compression stocking on the left leg however that is reopened with superficial wounds anteriorly he did not have an open wound on this leg previously. He is still using his juxta light's once daily at night. He cannot find the time to do this in the morning as he has to be at work by 7 AM 05/19/17; right lateral leg wound looks improved. No debridement required. The concerning area is on the left posterior leg which appears to almost have a subcutaneous hemorrhagic component to it. We've been using silver alginate to all the wounds 05/26/17; the right lateral leg wound continues to look improved. However the area on the left posterior calf is a tightly adherent surface. [place] using silver alginate. Because of the weeping edema in his legs there is very little good alternatives. 06/02/17; the patient left  here last week looking quite good. Major wound on the left posterior calf and a small one on the right lateral calf. Both of these look satisfactory. He tells me that by Wednesday he had noted increased pain in the left leg and drainage. He called on Thursday and Friday to get an appointment here but we were blocked. He did not go to urgent care or his primary physician. He thinks he had a fever on Thursday but did not actually take his temperature. He has not been using his compression pumps on the left leg because of pain. I advised him to go to the emergency room today for IV antibiotics for stents of left leg cellulitis but he has refused I have asked him to take 2 days off work to keep his leg elevated and he has refused this as well. In view of this I'm going to call him and Augmentin and doxycycline. 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 However after a detailed discussion regarding the risk benefits and alternatives he refuses to get admitted to the hospital. With no other recourse I will continue him on oral antibiotics as before and hopefully he'll have his infectious disease consultation this week. 06/16/2017 -- the patient was seen today by the nurse practitioner at infectious disease Ms. Dixon. 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 He has also been advise Lotrimin cream locally. she also had wise IV antibiotics for his condition if it flares up 06/23/17; patient arrives today with drainage bilaterally although the remaining wound on the left posterior calf after  cleaning up today "highlighter yellow drainage" did not look too bad. Unfortunately he has had breakdown on the right anterior leg [previously this leg had not been open and he is using a black stocking] he went to see infectious  disease and is been put on clindamycin 150 mg daily, I did not verify the dose although I'm not familiar with using clindamycin in this dosing range, perhaps for prophylaxisoo 06/27/17; I brought this patient back today to follow-up on the wound deterioration on the right lower leg together with surrounding cellulitis. I started him on doxycycline 4 days ago. This area looks better however he comes in today with intense cellulitis on the medial part of his left thigh. This is not have a wound in this area. Extremely tender. We've been using silver alginate to the wounds on the right lower leg left lower leg with bilateral 4 layer compression he is using his external compression pumps once a day 07/04/17; patient's left medial thigh cellulitis looks better. He has not been using his compression pumps as his insert said it was contraindicated with cellulitis. His right leg continues to make improvements all the wounds are still open. We only have one remaining wound on the left posterior calf. Using silver alginate to all open areas. He is on doxycycline which I started a week ago and should be finishing I gave him Augmentin after Thursday's visit for the severe cellulitis on the left medial thigh which fortunately looks better 07/14/17; the patient's left medial thigh cellulitis has resolved. The cellulitis in his right lower calf on the right also looks better. All of his wounds are stable to improved we've been using silver alginate he has completed the antibiotics I have given him. He has clindamycin 150 mg once a day prescribed by infectious disease for prophylaxis, I've advised him to start this now. We have been using bilateral Unna boots over silver alginate to the wound  areas 07/21/17; the patient is been to see infectious disease who noted his recurrent problems with cellulitis. He was not able to tolerate prophylactic clindamycin therefore he is on amoxicillin 500 twice a day. He also had a second daily dose of Lasix added By Dr. Oneta Rack but he is not taking this. Nor is he being completely compliant with his compression pumps a especially not this week. He has 2 remaining wounds one on the right posterior lateral lower leg and one on the left posterior medial lower leg. 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 The patient has Unna boots bilaterally. 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 He has bilateral Unna boots for compression and silver alginate to his wounds. Arrives today with his legs looking as good as I have seen him in quite some time. Not surprisingly his wounds look better as well with improvement on the right lateral leg venous insufficiency wound and also the left medial leg. He is still using the compression pumps once a day 08/11/17; both legs appear to be doing better wounds on the right lateral and left medial legs look better. Skin on the right leg quite good. He is been using silver alginate as the primary dressing. I'm going to use Anasept gel calcium alginate and maintain all the secondary dressings 08/18/17; the patient continues to actually do quite well. The area on his right lateral leg is just about closed the left medial also looks better although it is still moist in this area. His edema is well controlled we have been using Anasept gel with calcium alginate  and the usual secondary dressings, 4 layer compression and once daily use of his compression pumps "always been able to manage 09/01/17; the patient  continues to do reasonably well in spite of his trip to [place]. The area on the right lateral leg is epithelialized. Left is much better but still open. He has more edema and more chronic erythema on the left leg [venous inflammation] 09/08/17; he arrives today with no open wound on the right lateral leg and decently controlled edema. Unfortunately his left leg is not nearly as in his good situation as last week.he apparently had increasing edema starting on Saturday. He edema soaked through into his foot so used a plastic bag to walk around his home. The area on the medial right leg which was his open area is about the same however he has lost surface epithelium on the left lateral which is new and he has significant pain in the Achilles area of the left foot. He is already on amoxicillin chronically for prophylaxis of cellulitis in the left leg 09/15/17; he is completed a week of doxycycline and the cellulitis in the left posterior leg and Achilles area is as usual improved. He still has a lot of edema and fluid soaking through his dressings. There is no open wound on the right leg. He saw infectious disease NP today 09/22/17;As usual 1 we transition him from our compression wraps to his stockings things did not go well. He has several small open areas on the right leg. He states this was caused by the compression wrap on his skin although he did not wear this with the stockings over them. He has several superficial areas on the left leg medially laterally posteriorly. He does not have any evidence of active cellulitis especially involving the left Achilles The patient is traveling from [facility] Saturday going to [facility]. He states he isn't attempting to get an appointment with a heel objects wound center there to change his dressings. I am not completely certain whether this will work 10/06/17; the patient came in on Friday for a nurse visit and the nurse reported that his legs actually look  quite good. He arrives in clinic today for his regular follow-up visit. He has a new wound on his left third toe over the PIP probably caused by friction with his footwear. He has small areas on the left leg and a very superficial but epithelialized area on the right anterior lateral lower leg. Other than that his legs look as good as I've seen him in quite some time. We have been using silver alginate Review of systems; no chest pain no shortness of breath other than this a 10 point review of systems negative 10/20/17; seen by Dr. Meyer Russel last week. He had taken some antibiotics [doxycycline] that he had left over. Dr. Meyer Russel thought he had candida infection and declined to give him further antibiotics. He has a small wound remaining on the right lateral leg several areas on the left leg including a larger area on the left posterior several left medial and anterior and a small wound on the left lateral. The area on the left dorsal third toe looks a lot better. ROS; Gen.; no fever, respiratory no cough no sputum Cardiac no chest pain other than this 10 point review of system is negative 10/30/17; patient arrives today having fallen in the bathtub 3 days ago. It took him a while to get up. He has pain and maceration in the wounds on his  left leg which have deteriorated. He has not been using his pumps he also has some maceration on the right lateral leg. 11/03/17; patient continues to have weeping edema especially in the left leg. This saturates his dressings which were just put on on 12/27. As usual the doxycycline seems to take care of the cellulitis on his lower leg. He is not complaining of fever, chills, or other systemic symptoms. He states his leg feels a lot better on the doxycycline I gave him empirically. He also apparently gets injections at his primary doctor's officeo Rocephin for cellulitis prophylaxis. I didn't ask him about his compression pump compliance today I think that's probably  marginal. Arrives in the clinic with all of his dressings primary and secondary macerated full of fluid and he has bilateral edema 11/10/17; the patient's right leg looks some better although there is still a cluster of wounds on the right lateral. The left leg is inflamed with almost circumferential skin loss medially to laterally although we are still maintaining anteriorly. He does not have overt cellulitis there is a lot of drainage. He is not using compression pumps. We have been using silver alginate to the wound areas, there are not a lot of options here 11/17/17; the patient's right leg continues to be stable although there is still open wounds, better than last week. The inflammation in the left leg is better. Still loss of surface layer epithelium especially posteriorly. There is no overt cellulitis in the amount of edema and his left leg is really quite good, tells me he is using his compression pumps once a day. 11/24/17; patient's right leg has a small superficial wound laterally this continues to improve. The inflammation in the left leg is still improving however we have continuous surface layer epithelial loss posteriorly. There is no overt cellulitis in the amount of edema in both legs is really quite good. He states he is using his compression pumps on the left leg once a day for 5 out of 7 days 12/01/17; very small superficial areas on the right lateral leg continue to improve. Edema control in both legs is better today. He has continued loss of surface epithelialization and left posterior calf although I think this is better. We have been using silver alginate with large number of absorptive secondary dressings 4 layer on the left Unna boot on the right at his request. He tells me he is using his compression pumps once a day 12/08/17; he has no open area on the right leg is edema control is good here. On the left leg however he has marked erythema and tenderness breakdown of skin. He has  what appears to be a wrap injury just distal to the popliteal fossa. 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 There was swelling erythema and breakdown in the right leg. He has much worse skin breakdown in the left leg as well multiple open areas medially and posteriorly as well as laterally. He tells me he has been using his compression pumps but tells me he feels that the drainage out of his leg is worse when he uses a compression pumps. T be fair to him he is been saying this o for a while however I  don't know that I have really been listening to this. I wonder if the compression pumps are working properly 12/22/17;. Once again he arrives with severe erythema, weeping edema from the left greater than right leg. Noncompliance with compression pumps. New this visit he is complaining of pain on the lateral aspect of the right leg and the medial aspect of his right thigh. He apparently saw his cardiologist Dr. [name] who was ordered an echocardiogram area and I think this is a step in the right direction 12/25/17; started his doxycycline Monday night. There is still intense erythema of the right leg especially in the anterior thigh although there is less tenderness. The erythema around the wound on the right lateral calf also is less tender. He still complaining of pain in the left heel. His wounds are about the same right lateral left medial left lateral. Superficial but certainly not close to closure. He denies being systemically unwell no fever chills no abdominal pain no diarrhea 12/29/17; back in follow-up of his extensive right calf and right thigh cellulitis. I added amoxicillin to cover possible doxycycline resistant  strep. This seems to of done the trick he is in much less pain there is much less erythema and swelling. He has his echocardiogram at 11:00 this morning. X-ray of the left heel was also negative. 01/05/18; the patient arrived with his edema under much better control. Now that he is retired he is able to use his compression pumps daily and sometimes twice a day per the patient. He has a wound on the right leg the lateral wound looks better. Area on the left leg also looks a lot better. He has no evidence of cellulitis in his bilateral thighs I had a quick peak at his echocardiogram. He is in normal ejection fraction and normal left ventricular function. He has moderate pulmonary hypertension moderately reduced right ventricular function. One would have to wonder about chronic sleep apnea although he says he doesn't snore. He'll review the echocardiogram with his cardiologist. 01/12/18; the patient arrives with the edema in both legs under exemplary control. He is using his compression pumps daily and sometimes twice daily. His wound on the right lateral leg is just about closed. He still has some weeping areas on the posterior left calf and lateral left calf although everything is just about closed here as well. I have spoken with Aldean Baker who is the patient's nurse practitioner and infectious disease. She was concerned that the patient had not understood that the parenteral penicillin injections he was receiving for cellulitis prophylaxis was actually benefiting him. I don't think the patient actually saw that I would tend to agree we were certainly dealing with less infections although he had a serious one last month. 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 He is healed. He'll be placed in juxtalite compression wraps and increase his lymphedema pumps to twice daily. We will follow up again next week to ensure there are no issues with the new  regiment. 01/20/18-he is here for evaluation of bilateral lower extremity weeping edema. Yesterday he was placed in compression wrap to the right lower extremity and compression stocking to left lower shrubbery. He states he uses lymphedema pumps last night and again this morning and noted a blister to the left lower extremity. On exam he was noted to have drainage to the right lower extremity. He will be placed in Unna boots bilaterally and follow-up next week 01/26/18; patient was actually discharged a  week ago to his own juxta light stockings only to return the next day with bilateral lower extremity weeping edema.he was placed in bilateral Unna boots. He arrives today with pain in the back of his left leg. There is no open area on the right leg however there is a linear/wrap injury on the left leg and weeping edema on the left leg posteriorly. I spoke with infectious disease about 10 days ago. They were disappointed that the patient elected to discontinue prophylactic intramuscular penicillin shots as they felt it was particularly beneficial in reducing the frequency of his cellulitis. I discussed this with the patient today. He does not share this view. He'll definitely need antibiotics today. Finally he is traveling to [place] and trauma leaving this Saturday and returning a week later and he does not travel with his pumps. He is going by car 01/30/18; patient was seen 4 days ago and brought back in today for review of cellulitis in the left leg posteriorly. I put him on amoxicillin this really hasn't helped as much as I might like. He is also worried because he is traveling to [facility] by car. Finally we will be rewrapping him. There is no open area on the right leg over his left leg has multiple weeping areas as usual 02/09/18; The same wrap on for 10 days. He did not pick up the last doxycycline I prescribed for him. He apparently took 4 days worth he already had. There is nothing open on  his right leg and the edema control is really quite good. He's had damage in the left leg medially and laterally especially probably related to the prolonged use of Unna boots 02/12/18; the patient arrived in clinic today for a nurse visit/wrap change. He complained of a lot of pain in the left posterior calf. He is taking doxycycline that I previously prescribed for him. Unfortunately even though he used his stockings and apparently used to compression pumps twice a day he has weeping edema coming out of the lateral part of his right leg. This is coming from the lower anterior lateral skin area. 02/16/18; the patient has finished his doxycycline and will finish the amoxicillin 2 days. The area of cellulitis in the left calf posteriorly has resolved. He is no longer having any pain. He tells me he is using his compression pumps at least once a day sometimes twice. 02/23/18; the patient finished his doxycycline and Amoxil last week. On Friday he noticed a small erythematous circle about the size of a quarter on the left lower leg just above his ankle. This rapidly expanded and he now has erythema on the lateral and posterior part of the thigh. This is bright red. Also has an area on the dorsal foot just above his toes and a tender area just below the left popliteal fossa. He came off his prophylactic penicillin injections at his own insistence one or 2 months ago. This is obviously deteriorated since then 03/02/18; patient is on doxycycline and Amoxil. Culture I did last week of the weeping area on the back of his left calf grew group B strep. I have therefore renewed the amoxicillin 500 3 times a day for a further week. He has not been systemically unwell. Still complaining of an area of discomfort right under his left popliteal fossa. There is no open wound on the right leg. He tells me that he is using his pumps twice a day on most days 03/09/18; patient arrives in clinic today completing  his amoxicillin  today. The cellulitis on his left leg is better. Furthermore he tells me that he had intramuscular penicillin shots that his primary care office today. However he also states that the wrap on his right leg fell down shortly after leaving clinic last week. He developed a large blister that was present when he came in for a nurse visit later in the week and then he developed intense discomfort around this area.He tells me he is using his compression pumps 03/16/18; the patient has completed his doxycycline. The infectious part of this/cellulitis in the left heel area left popliteal area is a lot better. He has 2 open areas on the right calf. Still areas on the left calf but this is a lot better as well. 03/24/18; the patient arrives complaining of pain in the left popliteal area again. He thinks some of this is wrap injury. He has no open area on the right leg and really no open area on the left calf either except for the popliteal area. He claims to be compliant with the compression pumps 03/31/18; I gave him doxycycline last week because of cellulitis in the left popliteal area. This is a lot better although the surface epithelium is denuded off and response to this. He arrives today with uncontrolled edema in the right calf area as well as a fingernail injury in the right lateral calf. There is only a few open areas on the left 04/06/18; I gave him amoxicillin doxycycline over the last 2 weeks that the amoxicillin should be completing currently. He is not complaining of any pain or systemic symptoms. The only open areas see has is on the right lateral lower leg paradoxically I cannot see anything on the left lower leg. He tells me he is using his compression pumps twice a day on most days. Silver alginate to the wounds that are open under 4 layer compression 04/13/18; he completed antibiotics and has no new complaints. Using his compression pumps. Silver alginate that anything that's opened 04/20/18; he is  using his compression pumps religiously. Silver alginate 4 layer compression anything that's opened. He comes in today with no open wounds on the left leg but 3 on the right including a new one posteriorly. He has 2 on the right lateral and one on the right posterior. He likes Unna boots on the right leg for reasons that aren't really clear we had the usual 4 layer compression on the left. It may be necessary to move to the 4 layer compression on the right however for now I left them in the Unna boots 04/27/18; he is using his compression pumps at least once a day. He has still the wounds on the right lateral calf. The area right posteriorly has closed. He does not have an open wound on the left under 4 layer compression however on the dorsal left foot just proximal to the toes and the left third toe 2 small open areas were identified 05/11/18; he has not uses compression pumps. The areas on the right lateral calf have coalesced into one large wound necrotic surface. On the left side he has one small wound anteriorly however the edema is now weeping out of a large part of his left leg. He says he wasn't using his pumps because of the weeping fluid. I explained to him that this is the time he needs to pump more 05/18/18; patient states he is using his compression pumps twice a day. The area on the right lateral  large wound albeit superficial. On the left side he has innumerable number of small new wounds on the left calf particularly laterally but several anteriorly and medially. All these appear to have healthy granulated base these look like the remnants of blisters however they occurred under compression. The patient arrives in clinic today with his legs somewhat better. There is certainly less edema, less multiple open areas on the left calf and the right anterior leg looks somewhat better as well superficial and a little smaller. However he relates pain and erythema over the last 3-4 days in the thigh  and I looked at this today. He has not been systemically unwell no fever no chills no change in blood sugar values 05/25/18; comes in today in a better state. The severe cellulitis on his left leg seems better with the Keflex. Not as tender. He has not been systemically unwell Hard to find an open wound on the left lower leg using his compression pumps twice a day The confluent wounds on his right lateral calf somewhat better looking. These will ultimately need debridement I didn't do this today. 06/01/18; the severe cellulitis on the left anterior thigh has resolved and he is completed his Keflex. There is no open wound on the left leg however there is a superficial excoriation at the base of the third toe dorsally. Skin on the bottom of his left foot is macerated looking. The left the wounds on the lateral right leg actually looks some better although he did require debridement of the top half of this wound area with an open curet 06/09/18 on evaluation today patient appears to be doing poorly in regard to his right lower extremity in particular this appears to likely be infected he has very thick purulent discharge along with a bright green tent to the discharge. This makes me concerned about the possibility of pseudomonas. He's also having increased discomfort at this point on evaluation. Fortunately there does not appear to be any evidence of infection spreading to the other location at this time. 06/16/18 on evaluation today patient appears to actually be doing fairly well. His ulcer has actually diminished in size quite significantly at this point which is good news. Nonetheless he still does have some evidence of infection he did see infectious disease this morning before coming here for his appointment. I did review the results of their evaluation and their note today. They did actually have him discontinue the Cipro and initiate treatment with linezolid at this time. He is doing this for the  next seven days and they recommended a follow-up in four months with them. He is the keep a log of the need for intermittent antibiotic therapy between now and when he falls back up with infectious disease. This will help them gaze what exactly they need to do to try and help them out. 06/23/18; the patient arrives today with no open wounds on the left leg and left third toe healed. He is been using his compression pumps twice a day. On the right lateral leg he still has a sizable wound but this is a lot better than last time I saw this. 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 Has been using silver alginate under 4 layer compression 06/30/18; the only open wound he has is on the right lateral leg and this looks healthy. No debridement is required. We have been using silver  alginate. He does not have an open wound on the left leg. There is apparently some drainage from the dorsal proximal third toe on the left although I see no open wound here. 07/03/18 on evaluation today patient was actually here just for a nurse visit rapid change. However when he was here on Wednesday for his rat change due to having been healed on the left and then developing blisters we initiated the wrap again knowing that he would be back today for [place] to reevaluate and see were at. Unfortunately he has developed some cellulitis into the proximal portion of his right lower extremity even into the region of his thigh. 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 He was placed on one as what at that point. Nonetheless he is done with that and has been tolerating it well otherwise. Doxycycline which in the past really did not seem to be effective for him. Nonetheless I think the best option may be for [place] to definitely reinitiate the antibiotics for a longer period of time. 07/07/18; since I  last saw this patient a week ago he has had a difficult time. At that point he did not have an open wound on his left leg. We transitioned him into juxta light stockings. He was apparently in the clinic the next day with blisters on the left lateral and left medial lower calf. He also had weeping edema fluid. He was put back into a compression wrap. He was also in the clinic on Friday with intense erythema in his right thigh. Per the patient he was started on Bactrim however that didn't work at all in terms of relieving his pain and swelling. He has taken 3 doxycycline that he had left over from last time and that seems to of helped. He has blistering on the right thigh as well. 07/14/18; the erythema on his right thigh has gotten better with doxycycline that he is finishing. 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 Presumably this cellulitis in the thigh was not related to that although I think this is a bit concerning going forward. He still has an area on the right lateral calf the blister on the right medial calf just below the knee that was discussed above. On the left 2 small open areas left medial and left lateral. Edema control is adequate. He is using his compression pumps twice a day 07/20/18; continued improvement in the condition of both legs especially the edema in his bilateral thighs. He tells me he is been losing weight through a combination of diet and exercise. He is using his compression pumps twice a day. So overall she made to the remaining wounds 07/27/2018; continued improvement in condition of both legs. His edema is well controlled. The area on the right lateral leg is just about closed he had one blisters show up on the medial left upper calf. We have him in 4 layer compression. He is going on a 10-day trip to [place], [place] and [place]. He will be driving. He wants to wear Unna boots because of  the lessening amount of constriction. He will not use compression pumps while he is away 08/05/18 on evaluation today patient actually appears to be doing decently well all things considered in regard to his bilateral lower extremities. The worst ulcer is actually only posterior aspect of his left lower  extremity with a four layer compression wrap cut into his leg a couple weeks back. He did have a trip and actually had [employer] for the trip that he is worn since he was last here. Nonetheless he feels like the [employer] actually do better for him his swelling is up a little bit but he also with his trip was not taking his Lasix on a regular set schedule like he was supposed to be. He states that obviously the reason being that he cannot drive and keep going without having to urinate too frequently which makes it difficult. He did not have his pumps with him while he was away either which I think also maybe playing a role here too. 08/13/2018; the patient only has a small open wound on the right lateral calf which is a big improvement in the last month or 2. He also has the area posteriorly just below the posterior fossa on the left which I think was a wrap injury from several weeks ago. He has no current evidence of cellulitis. He tells me he is back into his compression pumps twice a day. He also tells me that while he was at the laundromat somebody stole a section of his extremitease stockings 08/20/2018; back in the clinic with a much improved state. He only has small areas on the right lateral mid calf which is just about healed. This was is more substantial area for quite a prolonged period of time. He has a small open area on the left anterior tibia. The area on the posterior calf just below the popliteal fossa is closed today. He is using his compression pumps twice a day 08/28/2018; patient has no open wound on the right leg. He has a smattering of open areas on the calf with some  weeping lymphedema. More problematically than that it looks as though his wraps of slipped down in his usual he has very angry upper area of edema just below the right medial knee and on the right lateral calf. He has no open area on his feet. The patient is traveling to [facility] next week. I will send him in an antibiotic. We will continue to wrap the right leg. We ordered extremitease stockings for him last week and I plan to transition the right leg to a stocking when he gets home which will be in 10 days time. As usual he is very reluctant to take his pumps with him when he travels 09/07/2018; patient returns from [facility]. He shows me a picture of his left leg in the mid part of his trip last week with intense fire engine erythema. The picture look bad enough I would have considered sending him to the hospital. Instead he went to the wound care center in [facility]. They did not prescribe him antibiotics but he did take some doxycycline he had leftover from a previous visit. I had given him trimethoprim sulfamethoxazole before he left this did not work according to the patient. This is resulted in some improvement fortunately. He comes back with a large wound on the left posterior calf. Smaller area on the left anterior tibia. Denuded blisters on the dorsal left foot over his toes. Does not have much in the way of wounds on the right leg although he does have a very tender area on the right posterior area just below the popliteal fossa also suggestive of infection. He promises me he is back on his pumps twice a  day 09/15/2018; the intense cellulitis in his left lower calf is a lot better. The wound area on the posterior left calf is also so better. However he has reasonably extensive wounds on the dorsal aspect of his second and third toes and the proximal foot just at the base of the toes. There is nothing open on the right leg 09/22/2018; the patient has excellent edema  control in his legs bilaterally. He is using his external compression pumps twice a day. He has no open area on the right leg and only the areas in the left foot dorsally second and third toe area on the left side. He does not have any signs of active cellulitis. 10/06/2018; the patient has good edema control bilaterally. He has no open wound on the right leg. There is a blister in the posterior aspect of his left calf that we had to deal with today. He is using his compression pumps twice a day. There is no signs of active cellulitis. We have been using silver alginate to the wound areas. He still has vulnerable areas on the base of his left first second toes dorsally He has a his extremities stockings and we are going to transition him today into the stocking on the right leg. He is cautioned that he will need to continue to use the compression pumps twice a day. If he notices uncontrolled edema in the right leg he may need to go to 3 times a day. 10/13/2018; the patient came in for a nurse check on Friday he has a large flaccid blister on the right medial calf just below the knee. We unroofed this. He has this and a new area underneath the posterior mid calf which was undoubtedly a blister as well. He also has several small areas on the right which is the area we put his extremities stocking on. 10/19/2018; the patient went to see infectious disease this morning I am not sure if that was a routine follow-up in any case the doxycycline I had given him was discontinued and started on linezolid. He has not started this. It is easy to look at his left calf and the inflammation and think this is cellulitis however he is very tender in the tissue just below the popliteal fossa and I have no doubt that there is infection going on here. He states the problem he is having is that with the compression pumps the edema goes down and then starts walking the wrap falls down. We will see if we can adhere this. He  has 1 or 2 minuscule open areas on the right still areas that are weeping on the posterior left calf, the base of his left second and third toes 10/26/18; back today in clinic with quite of skin breakdown in his left anterior leg. This may have been infection the area below the popliteal fossa seems a lot better however tremendous epithelial loss on the left anterior mid tibia area over quite inexpensive tissue. He has 2 blisters on the right side but no other open wound here. 10/29/2018; came in urgently to see [place] today and we worked him in for review. He states that the 4 layer compression on the right leg caused pain he had to cut it down to roughly his mid calf this caused swelling above the wrap and he has blisters and skin breakdown today. As a result of the pain he has not been using his pumps. Both legs are a lot more edematous and there  is a lot of weeping fluid. 11/02/18; arrives in clinic with continued difficulties in the right leg> left. Leg is swollen and painful. multiple skin blisters and new open areas especially laterally. He has not been using his pumps on the right leg. 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 He is not systemically unwell. 11/09/2018; the patient claims he is being compliant with his pumps. He is finished the doxycycline I gave him last week. Culture I did of the wound on the right lateral leg showed a few very resistant methicillin staph aureus. This was resistant to doxycycline. Nevertheless he states the pain in the leg is a lot better which makes me wonder if the cultured organism was not really what was causing the problem nevertheless this is a very dangerous organism to be culturing out of any wound. His right leg is still a lot larger than the left. He is using an Radio broadcast assistant on this area, he blames a 4-layer compression for causing the original skin breakdown which I doubt is true however I cannot talk him out  of it. We have been using silver alginate to all of these areas which were initially blisters 11/16/2018; patient is being compliant with his external compression pumps at twice a day. Miraculously he arrives in clinic today with absolutely no open wounds. He has better edema control on the left where he has been using 4 layer compression versus wound of wounds on the right and I pointed this out to him. There is no inflammation in the skin in his lower legs which is also somewhat unusual for him. There is no open wounds on the dorsal left foot. He has extremitease stockings at home and I have asked him to bring these in next week. 11/25/18 patient's lower extremity on examination today on the left appears for the most part to be wound free. He does have an open wound on the lateral aspect of the right lower extremity but this is minimal compared to what I've seen in past. He does request that we go ahead and wrap the left leg as well even though there's nothing open just so hopefully it will not reopen in short order. 1/28; patient has superficial open wounds on the right lateral calf left anterior calf and left posterior calf. His edema control is adequate. 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 We have been using silver alginate as the primary dressing. He claims compliance with his compression pumps 2/4; patient has superficial open wounds on numerous areas of his left calf and again one on the left dorsal foot. The areas on the right lateral calf have healed. The cellulitis that I gave him doxycycline for last week is also resolved this was mostly on the left anterior calf just below the tibial tuberosity. His edema looks fairly well-controlled. He tells me he went to see his primary doctor today and had blood work ordered 2/11; once again he has several open areas on the left calf left tibial area. Most of  these are small and appear to have healthy granulation. He does not have anything open on the right. The edema and control in his thighs is pretty good which is usually a good indication he has been using his pumps as requested. 2/18; he continues to have several small areas on the left calf and left tibial area. Most of  these are small healthy granulation. We put him in his stocking on the right leg last week and he arrives with a superficial open area over the right upper tibia and a fairly large area on the right lateral tibia in similar condition. His edema control actually does not look too bad, he claims to be using his compression pumps twice a day 2/25. Continued small areas on the left calf and left tibial area. New areas especially on the right are identified just below the tibial tuberosity and on the right upper tibia itself. There are also areas of weeping edema fluid even without an obvious wound. He does not have a considerable degree of lymphedema but clearly there is more edema here than his skin can handle. He states he is using the pumps twice a day. We have an Unna boot on the right and 4 layer compression on the left. 3/3; he continues to have an area on the right lateral calf and right posterior calf just below the popliteal fossa. There is a fair amount of tenderness around the wound on the popliteal fossa but I did not see any evidence of cellulitis, could just be that the wrap came down and rubbed in this area. He does not have an open area on the left leg however there is an area on the left dorsal foot at the base of the third toe We have been using silver alginate to all wound areas 3/10; he did not have an open area on his left leg last time he was here a week ago. T oday he arrives with a horizontal wound just below the tibial tuberosity and an area on the left lateral calf. He has intense erythema and tenderness in this area. The area is on the right lateral calf and  right posterior calf better than last week. We have been using silver alginate as usual 3/18 - Patient returns with 3 small open areas on left calf, and 1 small open area on right calf, the skin looks ok with no significant erythema, he continues the UNA boot on right and 4 layer compression on left. The right lateral calf wound is closed , the right posterior is small area. we will continue silver alginate to the areas. 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 He is been tolerating the dressing changes without complication. Fortunately she has made excellent improvement in regard to the overall status of his wounds. Unfortunately every time we cease wrapping him he ends up reopening in causing more significant issues at that point. Again I'm unsure of the best direction to take although I think the lymphedema clinic may be appropriate for him. 02/03/19 on evaluation today patient appears to be doing well in regard to the wounds that we saw him for last week unfortunately he has a new area on the proximal portion of his right medial/posterior lower extremity where the wrap somewhat slowed down and caused swelling and a blister to rub and open. Unfortunately this is the only opening that he has on either leg at this point. 02/17/19 on evaluation today patient's bilateral lower extremities appear to be doing well. He still completely healed in regard to the left lower extremity. In regard to the right lower extremity the area where the wrap and slid down and caused the  blister still seems to be slightly open although this is dramatically better than during the last evaluation two weeks ago. I'm very pleased with the way this stands overall. 03/03/19 on evaluation today patient appears to be doing well in regard  to his right lower extremity in general although he did have a new blister open this does not appear to be showing any evidence of active infection at this time. Fortunately there's No fevers, chills, nausea, or vomiting noted at this time. Overall I feel like he is making good progress it does feel like that the right leg will we perform the [employer] seems to do with a bit better than three layer wrap on the left which slid down on him. We may switch to doing bilateral in the book wraps. 5/4; I have not seen Mr. Hotard in quite some time. According to our case manager he did not have an open wound on his left leg last week. He had 1 remaining wound on the right posterior medial calf. He arrives today with multiple openings on the left leg probably were blisters and/or wrap injuries from Unna boots. I do not think the Unna boot's will provide adequate compression on the left. I am also not clear about the frequency he is using the compression pumps. 03/17/19 on evaluation today patient appears to be doing excellent in regard to his lower extremities compared to last week's evaluation apparently. He had gotten significantly worse last week which is unfortunate. The [employer] wrap on the left did not seem to do very well for him at all and in fact it didn't control his swelling significantly enough he had an additional outbreak. Subsequently we go back to the four layer compression wrap on the left. This is good news. At least in that he is doing better and the wound seem to be killing him. He still has not heard anything from the lymphedema clinic. 03/24/19 on evaluation today patient actually appears to be doing much better in regard to his bilateral lower Trinity as compared to last week when I saw him. Fortunately there's no signs of active infection at this time. He has been tolerating the dressing changes without complication. Overall I'm extremely pleased with the progress and appearance in  general. 04/07/19 on evaluation today patient appears to be doing well in regard to his bilateral lower extremities. His swelling is significantly down from where it was previous. With that being said he does have a couple blisters still open at this point but fortunately nothing that seems to be too severe and again the majority of the larger openings has healed at this time. 04/14/19 on evaluation today patient actually appears to be doing quite well in regard to his bilateral lower extremities in fact I'm not even sure there's anything significantly open at this time at any site. Nonetheless he did have some trouble with these wraps where they are somewhat irritating him secondary to the fact that he has noted that the graph wasn't too close down to the end of this foot in a little bit short as well up to his knee. Otherwise things seem to be doing quite well. 04/21/19 upon evaluation today patient's wound bed actually showed evidence of being completely healed in regard to both lower extremities which is excellent news. There does not appear to be any signs of active infection which is also good news. I'm very pleased in this regard. No fevers, chills, nausea, or vomiting noted at this time.  04/28/19 on evaluation today patient appears to be doing a little bit worse in regard to both lower extremities on the left mainly due to the fact that when he went infection disease the wrap was not wrapped quite high enough he developed a blister above this. On the right he is a small open area of nothing too significant but again this is continuing to give him some trouble he has been were in the Velcro compression that he has at home. 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 He's been tolerating the dressing changes without complication. Fortunately there's no signs of active infection at this time. No fevers, chills, nausea, or vomiting noted at  this time. We have been trying to get an appointment with her lymphedema clinic in [facility] but unfortunately nobody can get them on phone with not been able to even fax information over the patient likewise is not been able to get in touch with them. Overall I'm not sure exactly what's going on here with to reach out again today. 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 Still having a lot of drainage unfortunately. He tells me especially in the left but even on the right. There's no signs of active infection which is good news we've been using so ratcheted up to this point. 05/19/19 on evaluation today patient actually appears to be doing quite well with regard to his left lower extremity which is great news. Fortunately in regard to the right lower extremity has an issues with his wrap and he subsequently did remove this from what I'm understanding. Nonetheless long story short is what he had rewrapped once he removed it subsequently had maggots underneath this wrap whenever he came in for evaluation today. With that being said they were obviously completely cleaned away by the nursing staff. The visit today which is excellent news. However he does appear to potentially have some infection around the right ankle region where the maggots were located as well. He will likely require anabiotic therapy today. 05/26/19 on evaluation today patient actually appears to be doing much better in regard to his bilateral lower extremities. I feel like the infection is under much better control. With that being said there were maggots noted when the wrap was removed yet again today. Again this could have potentially been left over from previous although at this time there does not appear to be any signs of significant drainage there was obviously on the wrap some drainage as well this contracted gnats or  otherwise. Either way I do not see anything that appears to be doing worse in my pinion and in fact I think his drainage has slowed down quite significantly likely mainly due to the fact to his infection being under better control. 06/02/2019 on evaluation today patient actually appears to be doing well with regard to his bilateral lower extremities there is no signs of active infection at this time which is great news. With that being said he does have several open areas more so on the right than the left but nonetheless these are all significantly better than previously noted. 06/09/2019 on evaluation today patient actually appears to be doing well. His wrap stayed up and he did not cause any problems he had more drainage on the right compared to the left but overall I do not see any major issues  at this time which is great news. 06/16/2019 on evaluation today patient appears to be doing excellent with regard to his lower extremities the only area that is open is a new blister that can have opened as of today on the medial ankle on the left. Other than this he really seems to be doing great I see no major issues at this point. 06/23/2019 on evaluation today patient appears to be doing quite well with regard to his bilateral lower extremities. In fact he actually appears to be almost completely healed there is a small area of weeping noted of the right lower extremity just above the ankle. Nonetheless fortunately there is no signs of active infection at this time which is good news. No fevers, chills, nausea, vomiting, or diarrhea. 8/24; the patient arrived for a nurse visit today but complained of very significant pain in the left leg and therefore I was asked to look at this. Noted that he did not have an open area on the left leg last week nevertheless this was wrapped. The patient states that he is not been able to put his compression pumps on the left leg because of the discomfort. He has not been  systemically unwell 06/30/2019 on evaluation today patient unfortunately despite being excellent last week is doing much worse with regard to his left lower extremity today. In fact he had to come in for a nurse on Monday where his left leg had to be rewrapped due to excessive weeping Dr. Leanord Hawking placed him on doxycycline at that point. Fortunately there is no signs of active infection Systemically at this time which is good news. 07/07/2019 in regard to the patient's wounds today he actually seems to be doing well with his right lower extremity there really is nothing open or draining at this point this is great news. Unfortunately the left lower extremity is given him additional trouble at this time. There does not appear to be any signs of active infection nonetheless he does have a lot of edema and swelling noted at this point as well as blistering all of which has led to a much more poor appearing leg at this time compared to where it was 2 weeks ago when it was almost completely healed. Obviously this is a little discouraging for the patient. He is try to contact the lymphedema clinic in [place] he has not been able to get through to them. 07/14/2019 on evaluation today patient actually appears to be doing slightly better with regard to his left lower extremity ulcers. Overall I do feel like at least at the top of the wrap that we have been placing this area has healed quite nicely and looks much better. The remainder of the leg is showing signs of improvement. Unfortunately in the thigh area he still has an open region on the left and again on the right he has been utilizing just a Band-Aid on an area that also opened on the thigh. Again this is an area that were not able to wrap although we did do an Ace wrap to provide some compression that something that obviously is a little less effective than the compression wraps we have been using on the lower portion of the leg. He does have an  appointment with the lymphedema clinic in [facility] on Friday. 07/21/2019 on evaluation today patient appears to be doing better with regard to his lower extremity ulcers. He has been tolerating the dressing changes without complication. Fortunately there is no signs of  active infection at this time. No fevers, chills, nausea, vomiting, or diarrhea. I did receive the paperwork from the physical therapist at the lymphedema clinic in [place]. Subsequently I signed off on that this morning and sent that back to him for further progression with the treatment plan. 07/28/2019 on evaluation today patient appears to be doing very well with regard to his right lower extremity where I do not see any open wounds at this point. Fortunately he is feeling great as far as that is concerned as well. In regard to the left lower extremity he has been having issues with still several areas of weeping and edema although the upper leg is doing better his lower leg still I think is going require the compression wrap at this time. No fevers, chills, nausea, vomiting, or diarrhea. 08/04/2019 on evaluation today patient unfortunately is having new wounds on the right lower extremity. Again we have been using Unna boot wrap on that side. We switched him to using his juxta lite wrap at home. With that being said he tells me he has been using it although his legs extremely swollen and to be honest really does not appear that he has been. I cannot know that for sure however. Nonetheless he has multiple new wounds on the right lower extremity at this time. Obviously we will have to see about getting this rewrapped for him today. 08/11/2019 on evaluation today patient appears to be doing fairly well with regard to his wounds. He has been tolerating the dressing changes including the compression wraps without complication. He still has a lot of edema in his upper thigh regions bilaterally he is supposed to be seeing the  lymphedema clinic on the 15th of this month once his wraps arrive for the upper part of his legs. 08/18/2019 on evaluation today patient appears to be doing well with regard to his bilateral lower extremities at this point. He has been tolerating the dressing changes without complication. Fortunately there is no signs of active infection which is also good news. He does have a couple weeping areas on the first and second toe of the right foot he also has just a small area on the left foot upper leg and a small area on the left lower leg but overall he is doing quite well in my opinion. He is supposed to be getting his wraps shortly in fact tomorrow and then subsequently is seeing the lymphedema clinic next Wednesday on the 21st. Of note he is also leaving on the 25th to go on vacation for a week to the beach. For that reason and since there is some uncertainty about what there can be doing at lymphedema clinic next Wednesday I am get a make an appointment for next Friday here for [place] to see what we need to do for him prior to him leaving for vacation. 10/23; patient arrives in considerable pain predominantly in the upper posterior calf just distal to the popliteal fossa also in the wound anteriorly above the major wound. This is probably cellulitis and he has had this recurrently in the past. He has no open wound on the right side and he has had an Radio broadcast assistant in that area. Finally I note that he has an area on the left posterior calf which by enlarge is mostly epithelialized. This protrudes beyond the borders of the surrounding skin in the setting of dry scaly skin and lymphedema. The patient is leaving for [facility] on Sunday. Per his longstanding pattern,  he will not take his compression pumps with him predominantly out of fear that they will be stolen. He therefore asked that we put a Unna boot back on the right leg. He will also contact the wound care center in [facility] to see if they can  change his dressing in the mid week. 11/3; patient returned from his vacation to [facility]. He was seen on 1 occasion at their wound care center. They did a 2 layer compression system as they did not have our 4-layer wrap. I am not completely certain what they put on the wounds. They did not change the Unna boot on the right. The patient is also seeing a lymphedema specialist physical therapist in [place]. It appears that he has some compression sleeve for his thighs which indeed look quite a bit better than I am used to seeing. He pumps over these with his external compression pumps. 11/10; the patient has a new wound on the right medial thigh otherwise there is no open areas on the right. He has an area on the left leg posteriorly anteriorly and medially and an area over the left second toe. We have been using silver alginate. He thinks the injury on his thigh is secondary to friction from the compression sleeve he has. 11/17; the patient has a new wound on the right medial thigh last week. He thinks this is because he did not have a underlying stocking for his thigh juxta lite apparatus. He now has this. The area is fairly large and somewhat angry but I do not think he has underlying cellulitis. He has a intact blister on the right anterior tibial area. Small wound on the right great toe dorsally Small area on the medial left calf. 11/30; the patient does not have any open areas on his right leg and we did not take his juxta lite stocking off. However he states that on Friday his compression wrap fell down lodging around his upper mid calf area. As usual this creates a lot of problems for him. He called urgently today to be seen for a nurse visit however the nurse visit turned into a provider visit because of extreme erythema and pain in the left anterior tibia extending laterally and posteriorly. The area that is problematic is extensive 10/06/2019 upon evaluation today patient actually  appears to be doing poorly in regard to his left lower extremity. He Dr. Leanord Hawking did place him on doxycycline this past Monday apparently due to the fact that he was doing much worse in regard to this left leg. Fortunately the doxycycline does seem to be helping. Unfortunately we are still having a very difficult time getting his edema under any type of control in order to anticipate discharge at some point. The only way were really able to control his lymphedema really is with compression wraps and that has only even seemingly temporary. He has been seeing a lymphedema clinic they are trying to help in this regard but still this has been somewhat frustrating in general for the patient. 10/13/19 on evaluation today patient appears to be doing excellent with regard to his right lower extremity as far as the wounds are concerned. His swelling is still quite extensive unfortunately. He is still having a lot of drainage from the thigh areas bilaterally which is unfortunate. He's been going to lymphedema clinic but again he still really does not have this edema under control as far as his lower extremities are concern. With regard to his left  lower extremity this seems to be improving and I do believe the doxycycline has been of benefit for him. He is about to complete the doxycycline. 10/20/2019 on evaluation today patient appears to be doing poorly in regard to his bilateral lower extremities. More in the right thigh he has a lot of irritation at this site unfortunately. In regard to the left lower extremity the wrap was not quite as high it appears and does seem to have caused him some trouble as well. Fortunately there is no evidence of systemic infection though he does have some blue-green drainage which has me concerned for the possibility of Pseudomonas. He tells me he is previously taking Cipro without complications and he really does not care for Levaquin however due to some of the side effects he  has. He is not allergic to any medications specifically antibiotics that were aware of. 10/27/2019 on evaluation today patient actually does appear to be for the most part doing better when compared to last week's evaluation. With that being said he still has multiple open wounds over the bilateral lower extremities. He actually forgot to start taking the Cipro and states that he still has the whole bottle. He does have several new blisters on left lower extremity today I think I would recommend he go ahead and take the Cipro based on what I am seeing at this point. 12/30-Patient comes at 1 week visit, 4 layer compression wraps on the left and Unna boot on the right, primary dressing Xtrasorb and silver alginate. Patient is taking his Cipro and has a few more days left probably 5-6, and the legs are doing better. He states he is using his compressions devices which I believe he has 11/10/2019 on evaluation today patient actually appears to be much better than last time I saw him 2 weeks ago. His wounds are significantly improved and overall I am very pleased in this regard. Fortunately there is no signs of active infection at this time. He is just a couple of days away from completing Cipro. Overall his edema is much better he has been using his lymphedema pumps which I think is also helping at this point. 11/17/2019 on evaluation today patient appears to be doing excellent in regard to his wounds in general. His legs are swollen but not nearly as much as they have been in the past. Fortunately he is tolerating the compression wraps without complication. No fevers, chills, nausea, vomiting, or diarrhea. He does have some erythema however in the distal portion of his right lower extremity specifically around the forefoot and toes there is a little bit of warmth here as well. 11/24/2019 on evaluation today patient appears to be doing well with regard to his right lower extremity I really do not see any open  wounds at this point. His left lower extremity does have several open areas and his right medial thigh also is open. Other than this however overall the patient seems to be making good progress and I am very pleased at this point. 12/01/2019 on evaluation today patient appears to be doing poorly at this point in regard to his left lower extremity has several new blisters despite the fact that we have him in compression wraps. In fact he had a 4-layer compression wrap, his upper thigh wrapped from lymphedema clinic, and a juxta light over top of the 4 layer compression wrap the lymphedema clinic applied and despite all this he still develop blisters underneath. Obviously this does have me concerned  about the fact that unfortunately despite what we are doing to try to get wounds healed he continues to have new areas arise I do not think he is ever good to be at the point where he can realistically just use wraps at home to keep things under control. Typically when we heal him it takes about 1-2 days before he is back in the clinic with severe breakdown and blistering of his lower extremities bilaterally. This is happened numerous times in the past. Unfortunately I think that we may need some help as far as overall fluid overload to kind of limit what we are seeing and get things under better control. 12/08/2019 on evaluation today patient presents for follow-up concerning his ongoing bilateral lower extremity edema. Unfortunately he is still having quite a bit of swelling the compression wraps are controlling this to some degree but he did see Dr. [name] his cardiologist I do have that available for review today as far as the appointment was concerned that was on 12/06/2019. Obviously that she has been 2 days ago. The patient states that he is only been taking the Lasix 80 mg 1 time a day he had told me previously he was taking this twice a day. Nonetheless Dr. [name] recommended this be up to 80 mg 2 times a  day for the patient as he did appear to be fluid overloaded. With that being said the patient states he did this yesterday and he was unable to go anywhere or do anything due to the fact that he was constantly having to urinate. Nonetheless I think that this is still good to be something that is important for him as far as trying to get his edema under control at all things that he is going to be able to just expect his wounds to get under control and things to be better without going through at least a period of time where he is trying to stabilize his fluid management in general and I think increasing the Lasix is likely the first step here. It was also mentioned the possibility that the patient may require metolazone. With that being said he wanted to have the patient take Lasix twice a day first and then reevaluating 2 months to see where things stand. 12/15/2019 upon evaluation today patient appears to be doing regard to his legs although his toes are showing some signs of weeping especially on the left at this point to some degree on the right. There does not appear to be any signs of active infection and overall I do feel like the compression wraps are doing well for him but he has not been able to take the Lasix at home and the increased dose that Dr. [name] recommended. He tells me that just not go to be feasible for him. Nonetheless I think in this case he should probably send a message to Dr. [name] in order to discuss options from the standpoint of possible admission to get the fluid off or otherwise going forward. 12/22/2019 upon evaluation today patient appears to be doing fairly well with regard to his lower extremities at this point. In fact he would be doing excellent if it was not for the fact that his right anterior thigh apparently had an allergic reaction to adhesive tape that he used. The wound itself that we have been monitoring actually appears to be healed. There is a lot of  irritation at this point. 12/29/2019 upon evaluation today patient appears to be doing well  in regard to his lower extremities. His left medial thigh is open and somewhat draining today but this is the only region that is open the right has done much better with the treatment utilizing the steroid cream that I prescribed for him last week. Overall I am pleased in that regard. Fortunately there is no signs of active infection at this time. No fevers, chills, nausea, vomiting, or diarrhea. 01/05/2020 upon evaluation today patient appears to be doing more poorly in regard to his right lower extremity at this point upon evaluation today. Unfortunately he continues to have issues in this regard and I think the biggest issue is controlling his edema. This obviously is not very well controlled at this point is been recommended that he use the Lasix twice a day but he has not been able to do that. Unfortunately I think this is leading to an issue where honestly he is not really able to effectively control his edema and therefore the wounds really are not doing significantly better. I do not think that he is going to be able to keep things under good control unless he is able to control his edema much better. I discussed this again in great detail with him today. 01/12/2020 good news is patient actually appears to be doing quite well today at this point. He does have an appointment with lymphedema clinic tomorrow. His legs appear healed and the toe on the left is almost completely healed. In general I am very pleased with how things stand at this point. 01/19/2020 upon evaluation today patient appears to actually be doing well in regard to his lower extremities there is nothing open at this point. Fortunately he has done extremely well more recently. Has been seeing lymphedema clinic as well. With that being said he has Velcro wraps for his lower legs as well as his upper legs. The only wound really is on his toe  which is the right great toe and this is barely anything even there. With all that being said I think it is good to be appropriate today to go ahead and switch him over to the Velcro compression wraps. 01/26/2020 upon evaluation today patient appears to be doing worse with regard to his lower extremities after last week switch him to Velcro compression wraps. Unfortunately he lasted less than 24 hours he did not have the sock portion of his Velcro wrap on the left leg and subsequently developed a blister underneath the Velcro portion. Obviously this is not good and not what we were looking for at this point. He states the lymphedema clinic did tell him to wear the wrap for 23 hours and take him off for 1 I am okay with that plan but again right now we got a get things back under control again he may have some cellulitis noted as well. 02/02/2020 upon evaluation today patient unfortunately appears to have several areas of blistering on his bilateral lower extremities today mainly on the feet. His legs do seem to be doing somewhat better which is good news. Fortunately there is no evidence of active infection at this time. No fevers, chills, nausea, vomiting, or diarrhea. 02/16/2020 upon evaluation today patient appears to be doing well at this time with regard to his legs. He has a couple weeping areas on his toes but for the most part everything is doing better and does appear to be sealed up on his legs which is excellent news. We can continue with wrapping him at this point  as he had every time we discontinue the wraps he just breaks out with new wounds. There is really no point in is going forward with this at this point. 03/08/2020 upon evaluation today patient actually appears to be doing quite well with regard to his lower extremity ulcers. He has just a very superficial and really almost nonexistent blister on the left lower extremity he has in general done very well with the compression wraps. With  that being said I do not see any signs of infection at this time which is good news. 03/29/2020 upon evaluation today patient appears to be doing well with regard to his wounds currently except for where he had several new areas that opened up due to some of the wrap slipping and causing him trouble. He states he did not realize they had slipped. Nonetheless he has a 1 area on the right and 3 new areas on the left. Fortunately there is no signs of active infection at this time which is great news. 04/05/2020 upon evaluation today patient actually appears to be doing quite well in general in regard to his legs currently. Fortunately there is no signs of active infection at this time. No fevers, chills, nausea, vomiting, or diarrhea. He tells me next week that he will actually be seen in the lymphedema clinic on Thursday at 10 AM I see him on Wednesday next week. 04/12/2020 upon evaluation today patient appears to be doing very well with regard to his lower extremities bilaterally. In fact he does not appear to have any open wounds at this point which is good news. Fortunately there is no signs of active infection at this time. No fevers, chills, nausea, vomiting, or diarrhea. 04/19/2020 upon evaluation today patient appears to be doing well with regard to his wounds currently on the bilateral lower extremities. There does not appear to be any signs of active infection at this time. Fortunately there is no evidence of systemic infection and overall very pleased at this point. Nonetheless after I held him out last week he literally had blisters the next morning already which swelled up with him being right back here in the clinic. Overall I think that he is just not can be able to be discharged with his legs the way they are he is much to volume overloaded as far as fluid is concerned and that was discussed with him today of also discussed this but should try the clinic nurse manager as well as Dr.  Leanord Hawkingobson. 04/26/2020 upon evaluation today patient appears to be doing better with regard to his wounds currently. He is making some progress and overall swelling is under good control with the compression wraps. Fortunately there is no evidence of active infection at this time. 05/10/2020 on evaluation today patient appears to be doing overall well in regard to his lower extremities bilaterally. He is Tolerating the compression wraps without complication and with what we are seeing currently I feel like that he is making excellent progress. There is no signs of active infection at this time. 05/24/2020 upon evaluation today patient appears to be doing well in regard to his legs. The swelling is actually quite a bit down compared to where it has been in the past. Fortunately there is no sign of active infection at this time which is also good news. With that being said he does have several wounds on his toes that have opened up at this point. 05/31/2020 upon evaluation today patient appears to be doing well  with regard to his legs bilaterally where he really has no significant fluid buildup at this point overall he seems to be doing quite well. Very pleased in this regard. With regard to his toes these also seem to be drying up which is excellent. We have continue to wrap him as every time we tried as a transition to the juxta light wraps things just do not seem to get any better. 06/07/2020 upon evaluation today patient appears to be doing well with regard to his right leg at this point. Unfortunately left leg has a lot of blistering he tells me the wrap started to slide down on him when he tried to put his other Velcro wrap over top of it to help keep things in order but nonetheless still had some issues. 06/14/2020 on evaluation today patient appears to be doing well with regard to his lower extremity ulcers and foot ulcers at this point. I feel like everything is actually showing signs of improvement which  is great news overall there is no signs of active infection at this time. No fevers, chills, nausea, vomiting, or diarrhea. 06/21/2020 on evaluation today patient actually appears to be doing okay in regard to his wounds in general. With that being said the biggest issue I see is on his right foot in particular the first and second toe seem to be doing a little worse due to the fact this is staying very wet. I think he is probably getting need to change out his dressings a couple times in between each week when we see him in regard to his toes in order to keep this drier based on the location and how this is proceeding. 06/28/2020 on evaluation today patient appears to be doing a little bit more poorly overall in regard to the appearance of the skin I am actually somewhat concerned about the possibility of him having a little bit of an infection here. We discussed the course of potentially giving him a doxycycline prescription which he is taken previously with good result. With that being said I do believe that this is potentially mild and at this point easily fixed. I just do not want anything to get any worse. 07/12/2020 upon evaluation today patient actually appears to be making some progress with regard to his legs which is great news there does not appear to be any evidence of active infection. Overall very pleased with where things stand. 07/26/2020 upon evaluation today patient appears to be doing well with regard to his leg ulcers and toe ulcers at this point. He has been tolerating the compression wraps without complication overall very pleased in this regard. 08/09/2020 upon evaluation today patient appears to be doing well with regard to his lower extremities bilaterally. Fortunately there is no signs of active infection overall I am pleased with where things stand. 08/23/2020 on evaluation today patient appears to be doing well with regard to his wound. He has been tolerating the dressing  changes without complication. Fortunately there is no signs of active infection at this time. Overall his legs seem to be doing quite well which is great news and I am very pleased in that regard. No fevers, chills, nausea, vomiting, or diarrhea. 09/13/2020 upon evaluation today patient appears to be doing okay in regard to his lower extremities. He does have a fairly large blister on the right leg which I did remove the blister tissue from today so we can get this to dry out other than that however he  seems to be doing quite well. There is no signs of active infection at this time. 09/27/2020 upon evaluation today patient appears to actually be doing some better in regard to his right leg. Fortunately signs of active infection at this time which is great news. No fevers, chills, nausea, vomiting, or diarrhea. 10/04/2020 upon evaluation today patient actually appears to be showing signs of improvement which is great news with regard to his leg ulcers. Fortunately there is no signs of active infection which is great news he is still taking the antibiotics currently. No fevers, chills, nausea, vomiting, or diarrhea. 10/18/2020 on evaluation today patient appears to be doing well with regard to his legs currently. He has been tolerating the dressing changes including the wraps without complication. Fortunately there is no signs of active infection at this time. No fevers, chills, nausea, vomiting, or diarrhea. 10/25/2020 upon evaluation today patient appears to be doing decently well in regard to his wounds currently. He has been tolerating the dressing changes without complication. Overall I feel like he is making good progress albeit slow. Again this is something we can have to continue to wrap for some time to come most likely. 11/08/2020 upon evaluation today patient appears to be doing well with regard to his wounds currently. He has been tolerating the dressing changes without complication is not  currently on any antibiotics and he does not appear to show any signs of infection. He does continue to have a lot of drainage on the right leg not too severe but nonetheless this is very scattered. On the left leg this is looking to be much improved overall. 11/15/2020 upon evaluation today patient appears to be doing better with regard to his legs bilaterally. Especially the right leg which was much more significant last week. There does not appear to be any signs of active infection which is great news. No fevers, chills, nausea, vomiting, or diarrhea. 11/23/2019 upon evaluation today patient appears to be doing poorly still in regard to his lower extremities bilaterally. Unfortunately his right leg in particular appears to be doing much more poorly there is no signs really of infection this is not warm to touch but he does have a lot of drainage and weeping unfortunately. With that reason I do believe that we may need to initiate some treatment here to try to help calm down some of the swelling of the right leg. I think switching to a 4-layer compression wrap would be beneficial here. The patient is in agreement with giving this a try. 11/29/2020 upon evaluation today patient appears to be doing well currently in regard to his leg ulcers. I feel like the right leg is doing better he still has a lot of drainage but we do see some improvement here. The 4-layer compression wrap I think was helpful. 12/06/2020 upon evaluation today patient appears to be doing well with regard to his legs. In fact they seem to be doing about the best I have seen up to this point. Fortunately there is no signs of active infection at this time. No fevers, chills, nausea, vomiting, or diarrhea. 12/20/2020 upon evaluation today patient appears to be doing well at this time in regard to his legs. He is not having any significant draining which is great news. Fortunately there is no signs of active infection at this time. No fevers,  chills, nausea, vomiting, or diarrhea. 01/17/2021 upon evaluation today [place] actually appears to be doing excellent in regard to his legs. He has a  few areas again that come and go as far as his toes are concerned but overall this is doing quite well. 01/31/2021 upon evaluation today patient appears to be doing well with regard to his legs. Fortunately there does not appear to be any signs of active infection which is great news. Overall he is still having significant edema despite the compression wraps basically the 4-layer compression wrap to just keep things under control there is really not much room for play. 4/13: Mr. Leonhardt is a longstanding patient in our clinic and benefits greatly from weekly compression wraps. Today he has no complaints. He has been tolerating the wraps well. He states he is using the lymphedema pumps at home. 5/4; patient presents for follow-up of his chronic lymphedema/venous insufficiency ulcers. He comes weekly for compression wraps. He has no complaints today. He was unable to tolerate the Coflex 2 layer Last week so we will do the four press 4-layer compression. He has been using his lymphedema pumps daily. 5/18; patient presents for 2-week follow-up. He has no complaints or issues today. He has developed a new wound to the right foot on his fourth toe. He overall feels well and denies signs of infection. 6/1; patient presents for 2-week follow-up. He has no complaints or issues today. He denies signs of infection. 04/18/2021 upon evaluation today patient appears to be doing well with regard to his legs bilaterally. Family open wound is actually on the toe of his left foot everything else is completely closed which is great news. In general I am extremely pleased with where things stand at this point. The patient is also happy that things are doing so well. 05/02/2021 upon evaluation today patient's legs actually appear to be doing quite well today. Fortunately there  does not appear to be any signs of active infection which is great and overall I am extremely pleased with where he stands today. The patient does not appear to have any evidence of active infection at this time which is also great news. 05/09/2021 upon evaluation today patient appears to be doing a little bit more poorly in regard to his legs. Unfortunately he is having issues with some breakdown and a blood blister on the left leg this is due to I believe honestly to how it was wrapped last week. Fortunately there does not appear to be any signs of infection but nonetheless this is still a concern to be honest. No fevers, chills, nausea, vomiting, or diarrhea. 05/16/2021 upon evaluation today patient appears to be doing significantly better as compared to last week. I am very pleased with where things stand today. There does not appear to be any signs of infection which is great news and overall very pleased with where we stand. No fevers, chills, nausea, vomiting, or diarrhea. 05/30/2021 upon evaluation today patient appears to be doing well with regard to his legs. He has been tolerating the dressing changes without complication. Fortunately there does not appear to be any signs of active infection which is great news and overall I am extremely pleased with where things stand today. No fevers, chills, nausea, vomiting, or diarrhea. 06/20/2021 upon evaluation today patient actually appears to be making good progress today and very pleased with what we are seeing. I think his legs are really maintaining. As long as we continue wrapping he seems to be doing excellent in my opinion. Fortunately there is no signs of active infection at this time. No fevers, chills, nausea, vomiting, or diarrhea. 07/11/2021 upon evaluation  today patient actually appears to be making excellent progress at this time. Fortunately there does not appear to be any evidence of active infection which is great news and overall I am  extremely pleased with where things stand today. No fevers, chills, nausea, vomiting, or diarrhea. 07/25/2021 upon evaluation today patient appears to be doing well currently in regard to his lower extremities. He has been making good progress here and I do not see anything that is actually open significantly today this is great news. No fevers, chills, nausea, vomiting, or diarrhea. 08/08/2021 upon evaluation today patient appears to be doing well with regard to his wound. He has been tolerating the dressing changes without complication. With that being said unfortunately has a new area that opened up as far as his right posterior leg is concerned this was a blister he also has an area on the third toe right foot which also reopen. Fortunately there is no signs of active infection at this time which is great news. No fevers, chills, nausea, vomiting, or diarrhea. 10/17; patient came in today at his request initially for a nurse visit because it but out of concern for deterioration in both his lower legs and cellulitis I was asked to look at him. He comes in with increased swelling which he says started over the weekend he started to notice pain as well in his left medial ankle, right knee, left knee left dorsal foot. His wraps fell down contributing to some of this but he has not been using his compression pumps over the weekend for reasons that are not really clear. He comes in with multiple new wounds including the right posterior leg, right third toe, right fourth toe, left second toe left medial ankle left dorsal ankle and right anterior lower leg 09/19/2021 upon evaluation today patient does seem to be making improvements in general which is great news. I do not see any evidence of infection currently he does have some hypergranulation of the anterior portion of his ankle on the left side this is going require some debridement to pare this down and then subsequently silver nitrate probably due to  the amount of bleeding that he is probably going to experience. He is in agreement with this plan however. 09/26/2021 upon evaluation today although the patient's legs appear to be doing okay today unfortunately he did have maggots noted during the evaluation as well. This is again quite unfortunate with the respect to the fact that he feels like that he got the wrap wet which was noted today he is not sure when and I think this is what led to the issue. Nonetheless we can try to see what we can do about silly things off so that this does not happen again in the future although he is can have to be diligent about taking care of his wraps as well. 10/03/2021 upon evaluation today patient appears to be doing okay in regard to his legs currently. He unfortunately has maggots again noted on the left foot. This obviously is becoming quite an issue to be perfectly honest. I do think that he needs to see what can be done at home to try to limit this exposure. Last week we had cleaned everything away so this is a new infestation as far as that is concerned. There is really not much that I can do to combat that I am trying to do what we can here in the clinic to wrap his foot and try to prevent any  access of that again with knots there is a small this becomes very difficult. 10/10/2021 upon evaluation today patient appears to be doing poorly in regard to his legs he is very swollen and unfortunately I think a big part of the issue here is simply that he is not taking his fluid pills appropriately stressed probably is not helping much at all either. He still having a lot of issues as far as getting moved and having to be out of his current living condition. With that being said he does have of note maggots noted on the right foot this time completely separate from what we have been seeing he tells me that he got his wraps wet again. Its mainly when it rains it sounds like that he goes out to his car in the area  where he has to park there is a area that collects a fairly large puddle according to what he tells me today either way I explained to him which we have done before as well that if he gets his wraps wet he needs to let us know so we can get them changed out. He does not need to keep them on wet. Electronic Signature(s) Signed: 10/10/2021 4:42:12 PM By: Lenda Kelp PA-C Entered By: Lenda Kelp on 10/10/2021 16:42:11 -------------------------------------------------------------------------------- Physical Exam Details Patient Name: Date of Service: [name], [name] 10/10/2021 10:30 A M Medical Record Number: [id number] Patient Account Number: [id number] Date of Birth/Sex: Treating RN: [birth year] (70 y.o. [name] Primary Care Provider: Nicoletta Ba Other Clinician: Referring Provider: Treating Provider/Extender: Adele Dan in Treatment: 298 Constitutional Chronically ill appearing but in no apparent acute distress. Respiratory normal breathing without difficulty. Psychiatric this patient is able to make decisions and demonstrates good insight into disease process. Alert and Oriented x 3. pleasant and cooperative. Notes We did clear away as many of the maggots as possible I did not see anything remaining once we were done today. Overall I think he is doing unfortunately quite poorly. I think he really needs to be doubling up on his fluid pills to try to get some of the excess fluid off else he is going to continue to have issues with ongoing problems with his legs to be perfectly honest. Electronic Signature(s) Signed: 10/10/2021 4:48:34 PM By: Lenda Kelp PA-C Entered By: Lenda Kelp on 10/10/2021 16:48:33 -------------------------------------------------------------------------------- Physician Orders Details Patient Name: Date of Service: [name], Bill J. 10/10/2021 10:30 A M Medical Record Number: [id number] Patient Account Number:  [id number] Date of Birth/Sex: Treating RN: [birth year] (70 y.o. [name] Primary Care Provider: Nicoletta Ba Other Clinician: Referring Provider: Treating Provider/Extender: Adele Dan in Treatment: [phone] Verbal / Phone Orders: No Diagnosis Coding ICD-10 Coding Code Description L97.521 Non-pressure chronic ulcer of other 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 of skin L97.829 Non-pressure chronic ulcer of other part of left lower leg with unspecified severity L97.519 Non-pressure chronic ulcer of other part of right foot with unspecified severity L97.812 Non-pressure chronic ulcer of other part of right lower leg with fat layer exposed I87.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 Leonard Schwartz Wednesday ***extra time 75 minutes**** Return A Call wound  center if dressing get wet from rain call to see if wound center can add you to schedule as a nurse visit to change. Other: - ****double the diuretic daily for the week.*** Bathing/ Shower/ Hygiene May shower with protection but do not get wound dressing(s) wet. Edema Control - Lymphedema / SCD / Other Bilateral Lower Extremities Lymphedema Pumps. Use Lymphedema pumps on leg(s) 2-3 times a day for 45-60 minutes. If wearing any wraps or hose, do not remove them. Continue exercising as instructed. Elevate legs to the level of the heart or above for 30 minutes daily and/or when sitting, a frequency of: - throughout the day Avoid standing for long periods of time. Exercise regularly Other Edema Control Orders/Instructions: - right leg 4 layer compression with unna boot  to secure to upper portion of lower leg. apply lotion to right leg. Wound Treatment Wound #[id number] - T Second oe Wound Laterality: Right Cleanser: Soap and Water 1 x Per Week Discharge Instructions: May shower and wash wound with dial antibacterial soap and water prior to dressing change. Cleanser: Wound Cleanser 1 x Per Week Discharge Instructions: Cleanse the wound with wound cleanser prior to applying a clean dressing using gauze sponges, not tissue or cotton balls. Peri-Wound Care: Zinc Oxide Ointment 30g tube 1 x Per Week Discharge Instructions: Apply Zinc Oxide to periwound with each dressing change Peri-Wound Care: Sween Lotion (Moisturizing lotion) 1 x Per Week Discharge Instructions: Apply moisturizing lotion as directed Prim Dressing: KerraCel Ag Gelling Fiber Dressing, 4x5 in (silver alginate) 1 x Per Week ary Discharge Instructions: Apply silver alginate to wound bed as instructed Secured With: Coban Self-Adherent Wrap 4x5 (in/yd) 1 x Per Week Discharge Instructions: Secure with Coban apply lightly as directed. Secured With: Insurance underwriter, Sterile 2x75 (in/in) 1 x Per Week Discharge Instructions: Secure with stretch gauze as directed. Wound #203 - Ankle Wound Laterality: Left, Anterior Cleanser: Soap and Water 1 x Per Week Discharge Instructions: May shower and wash wound with dial antibacterial soap and water prior to dressing change. Cleanser: Wound Cleanser 1 x Per Week Discharge Instructions: Cleanse the wound with wound cleanser prior to applying a clean dressing using gauze sponges, not tissue or cotton balls. Peri-Wound Care: Zinc Oxide Ointment 30g tube 1 x Per Week Discharge Instructions: Apply Zinc Oxide to periwound with each dressing change Peri-Wound Care: Sween Lotion (Moisturizing lotion) 1 x Per Week Discharge Instructions: Apply moisturizing lotion as directed Prim Dressing: KerraCel Ag Gelling Fiber Dressing, 4x5 in (silver alginate) 1 x Per  Week ary Discharge Instructions: Apply silver alginate to wound bed as instructed Secondary Dressing: ABD Pad, 8x10 1 x Per Week Discharge Instructions: Apply over primary dressing as directed. Compression Wrap: FourPress (4 layer compression wrap) 1 x Per Week Discharge Instructions: ****UNNA BOOT FIRST LAYER APPLIED TO UPPER PORTION OF LOWER LEG.*** Wound #204 - T Second oe Wound Laterality: Left Cleanser: Soap and Water 1 x Per Week Discharge Instructions: May shower and wash wound with dial antibacterial soap and water prior to dressing change. Cleanser: Wound Cleanser 1 x Per Week Discharge Instructions: Cleanse the wound with wound cleanser prior to applying a clean dressing using gauze sponges, not tissue or cotton balls. Peri-Wound Care: Zinc Oxide Ointment 30g tube 1 x Per Week Discharge Instructions: Apply Zinc Oxide to periwound with each dressing change Peri-Wound Care: Sween Lotion (Moisturizing lotion) 1 x Per Week Discharge Instructions: Apply moisturizing lotion as directed Prim Dressing: KerraCel Ag Gelling Fiber Dressing, 4x5 in (silver alginate) 1  x Per Week ary Discharge Instructions: Apply silver alginate to wound bed as instructed Secondary Dressing: gauze and zetuvit 1 x Per Week Discharge Instructions: apply gauze between the toes and apply zetuvit pads over the toes secure with the compression wrap to dorsal foot to aid in drainage. Secured With: Coban Self-Adherent Wrap 4x5 (in/yd) 1 x Per Week Discharge Instructions: Secure with Coban apply lightly as directed. Secured With: Insurance underwriter, Sterile 2x75 (in/in) 1 x Per Week Discharge Instructions: Secure with stretch gauze as directed. Wound #207 - T Third oe Wound Laterality: Left Cleanser: Soap and Water 1 x Per Week Discharge Instructions: May shower and wash wound with dial antibacterial soap and water prior to dressing change. Cleanser: Wound Cleanser 1 x Per Week Discharge  Instructions: Cleanse the wound with wound cleanser prior to applying a clean dressing using gauze sponges, not tissue or cotton balls. Peri-Wound Care: Zinc Oxide Ointment 30g tube 1 x Per Week Discharge Instructions: Apply Zinc Oxide to periwound with each dressing change Peri-Wound Care: Sween Lotion (Moisturizing lotion) 1 x Per Week Discharge Instructions: Apply moisturizing lotion as directed Prim Dressing: KerraCel Ag Gelling Fiber Dressing, 4x5 in (silver alginate) 1 x Per Week ary Discharge Instructions: Apply silver alginate to wound bed as instructed Secured With: Coban Self-Adherent Wrap 4x5 (in/yd) 1 x Per Week Discharge Instructions: Secure with Coban apply lightly as directed. Secured With: Insurance underwriter, Sterile 2x75 (in/in) 1 x Per Week Discharge Instructions: Secure with stretch gauze as directed. Wound #208 - T Great oe Wound Laterality: Right Cleanser: Soap and Water 1 x Per Week Discharge Instructions: May shower and wash wound with dial antibacterial soap and water prior to dressing change. Cleanser: Wound Cleanser 1 x Per Week Discharge Instructions: Cleanse the wound with wound cleanser prior to applying a clean dressing using gauze sponges, not tissue or cotton balls. Peri-Wound Care: Zinc Oxide Ointment 30g tube 1 x Per Week Discharge Instructions: Apply Zinc Oxide to periwound with each dressing change Peri-Wound Care: Sween Lotion (Moisturizing lotion) 1 x Per Week Discharge Instructions: Apply moisturizing lotion as directed Prim Dressing: KerraCel Ag Gelling Fiber Dressing, 4x5 in (silver alginate) 1 x Per Week ary Discharge Instructions: Apply silver alginate to wound bed as instructed Secured With: Coban Self-Adherent Wrap 4x5 (in/yd) 1 x Per Week Discharge Instructions: Secure with Coban apply lightly as directed. Secured With: Insurance underwriter, Sterile 2x75 (in/in) 1 x Per Week Discharge Instructions: Secure with stretch  gauze as directed. Wound #209 - Foot Wound Laterality: Dorsal, Right Cleanser: Soap and Water 1 x Per Week Discharge Instructions: May shower and wash wound with dial antibacterial soap and water prior to dressing change. Cleanser: Wound Cleanser 1 x Per Week Discharge Instructions: Cleanse the wound with wound cleanser prior to applying a clean dressing using gauze sponges, not tissue or cotton balls. Peri-Wound Care: Zinc Oxide Ointment 30g tube 1 x Per Week Discharge Instructions: Apply Zinc Oxide to periwound with each dressing change Peri-Wound Care: Sween Lotion (Moisturizing lotion) 1 x Per Week Discharge Instructions: Apply moisturizing lotion as directed Prim Dressing: KerraCel Ag Gelling Fiber Dressing, 4x5 in (silver alginate) 1 x Per Week ary Discharge Instructions: Apply silver alginate to wound bed as instructed Secondary Dressing: ABD Pad, 8x10 1 x Per Week Discharge Instructions: Apply over primary dressing as directed. Compression Wrap: FourPress (4 layer compression wrap) 1 x Per Week Discharge Instructions: ****UNNA BOOT FIRST LAYER APPLIED TO UPPER PORTION OF LOWER LEG.*** Electronic Signature(s)  Signed: 10/10/2021 4:36:51 PM By: Shawn Stall RN, BSN Signed: 10/10/2021 4:51:36 PM By: Lenda Kelp PA-C Entered By: Shawn Stall on 10/10/2021 11:44:52 -------------------------------------------------------------------------------- Problem List Details Patient Name: Date of Service: [name], [name] 10/10/2021 10:30 A M Medical Record Number: [id number] Patient Account Number: [id number] Date of Birth/Sex: Treating RN: 12/30/1950 (70 y.o. 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 04/27/2018 No Yes skin L97.829 Non-pressure chronic ulcer of other part of left lower leg with unspecified 03/21/2021 No Yes severity L97.519 Non-pressure chronic ulcer of other part of right foot with unspecified severity 03/21/2021 No Yes L97.812 Non-pressure chronic ulcer of other part of right lower leg with fat layer 08/08/2021 No Yes exposed I87.333 Chronic venous hypertension (idiopathic) with ulcer and inflammation of 01/22/2016 No Yes bilateral lower extremity L03.116 Cellulitis of left lower limb 08/20/2021 No Yes I89.0 Lymphedema, not elsewhere classified 01/22/2016 No Yes E11.40 Type 2 diabetes mellitus with diabetic neuropathy, unspecified 01/22/2016 No Yes E11.622 Type 2 diabetes mellitus with other skin ulcer 01/22/2016 No Yes L03.115 Cellulitis of right lower limb 12/22/2017 No Yes Inactive Problems ICD-10 Code Description Active Date Inactive Date 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 06/30/2018 06/30/2018 L97.511 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 of skin 06/30/2018 06/30/2018 Resolved Problems ICD-10 Code Description Active Date Resolved Date L97.211 Non-pressure chronic ulcer of right calf limited to breakdown of skin 06/30/2018 06/30/2018 L97.221 Non-pressure chronic ulcer of left calf limited to breakdown of skin 09/30/2016 09/30/2016 L03.116 Cellulitis of left lower limb 04/01/2017 04/01/2017 L97.211 Non-pressure chronic ulcer of right calf limited to breakdown of skin 06/30/2017 06/30/2017 Electronic Signature(s) Signed: 10/10/2021 10:55:34 AM By: Lenda Kelp PA-C Entered By: Lenda Kelp on 10/10/2021 10:55:32 -------------------------------------------------------------------------------- Progress Note Details Patient Name: Date of Service: [name], [name] 10/10/2021 10:30 A M Medical Record Number: [id number] Patient Account Number: [id number] Date of Birth/Sex: Treating  RN: 1950-12-08 (70 y.o. [name] Primary Care Provider: Nicoletta Ba Other Clinician: Referring Provider: Treating Provider/Extender: Adele Dan in Treatment: [phone] Subjective Chief Complaint Information obtained from Patient Patient is here for evaluation of venous/lymphedema ulcers History of Present Illness (HPI) Referred by PCP for consultation. Patient has long standing history of BLE venous stasis, no prior ulcerations. At beginning of month, developed cellulitis and weeping. Received IM Rocephin followed by Keflex and resolved. Wears compression stocking, appr 6 months old. Not sure strength. No present drainage. 01/22/16 this is a patient who is a type II diabetic on insulin. 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 He tells me he religiously wears pressure stockings of uncertain strength. He was here with weeping edema about 8 months ago but did not have an open wound. Roughly a month ago he had a reopening on his bilateral legs. He is been using bandages and Neosporin. He does not complain of pain. 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 Last BUN/creatinine I have is from 11/20/15 at 13 and 1.0 respectively 01/29/16; patient arrives today having tolerated the Profore wrap. He brought in his stockings and these are 18  mmHg stockings he bought from [place]. The compression here is likely inadequate. He does not complain of pain or excessive drainage she has no systemic symptoms. The wound on the right looks improved as does the one on the left although one on the left is more substantial with still tissue at risk below the actual wound area on the bilateral posterior calf 02/05/16; patient arrives with poor edema control. He states that we did put a 4 layer compression on it last week. No weight appear 5  this. 02/12/16; the area on the posterior right Has healed. The left Has a substantial wound that has necrotic surface eschar that requires a debridement with a curette. 02/16/16;the patient called or a Nurse visit secondary to increased swelling. He had been in earlier in the week with his right leg healed. He was transitioned to is on pressure stocking on the right leg with the only open wound on the left, a substantial area on the left posterior calf. Note he has a history of severe lower extremity edema, he has a history of chronic atrial fibrillation but not heart failure per my notes but I'll need to research this. He is not complaining of chest pain shortness of breath or orthopnea. The intake nurse noted blisters on the previously closed right leg 02/19/16; this is the patient's regular visit day. I see him on Friday with escalating edema new wounds on the right leg and clear signs of at least right ventricular heart failure. I increased his Lasix to 40 twice a day. He is returning currently in follow-up. States he is noticed a decrease in that the edema 02/26/16 patient's legs have much less edema. There is nothing really open on the right leg. The left leg has improved condition of the large superficial wound on the posterior left leg 03/04/16; edema control is very much better. The patient's right leg wounds have healed. On the left leg he continues to have severe venous inflammation on the posterior aspect of the left leg. There is no tenderness and I don't think any of this is cellulitis. 03/11/16; patient's right leg is married healed and he is in his own stocking. The patient's left leg has deteriorated somewhat. There is a lot of erythema around the wound on the posterior left leg. There is also a significant rim of erythema posteriorly just above where the wrap would've ended there is a new wound in this location and a lot of tenderness. Can't rule out cellulitis in this area. 03/15/16;  patient's right leg remains healed and he is in his own stocking. The patient's left leg is much better than last review. His major wound on the posterior aspect of his left Is almost fully epithelialized. He has 3 small injuries from the wraps. Really. Erythema seems a lot better on antibiotics 03/18/16; right leg remains healed and he is in his own stocking. The patient's left leg is much better. The area on the posterior aspect of the left calf is fully epithelialized. His 3 small injuries which were wrap injuries on the left are improved only one seems still open his erythema has resolved 03/25/16; patient's right leg remains healed and he is in his own stocking. There is no open area today on the left leg posterior leg is completely closed up. His wrap injuries at the superior aspect of his leg are also resolved. He looks as though he has some irritation on the dorsal ankle but this is fully epithelialized without evidence of infection.  03/28/16; we discharged this patient on Monday. Transitioned him into his own stocking. There were problems almost immediately with uncontrolled swelling weeping edema multiple some of which have opened. He does not feel systemically unwell in particular no chest pain no shortness of breath and he does not feel 04/08/16; the edema is under better control with the Profore light wrap but he still has pitting edema. There is one large wound anteriorly 2 on the medial aspect of his left leg and 3 small areas on the superior posterior calf. Drainage is not excessive he is tolerating a Profore light well 04/15/16; put a Profore wrap on him last week. This is controlled is edema however he had a lot of pain on his left anterior foot most of his wounds are healed 04/22/16 once again the patient has denuded areas on the left anterior foot which he states are because his wrap slips up word. He saw his primary physician today is on Lasix 40 twice a day and states that he his weight  is down 20 pounds over the last 3 months. 04/29/16: Much improved. left anterior foot much improved. He is now on Lasix 80 mg per day. Much improved edema control 05/06/16; I was hoping to be able to discharge him today however once again he has blisters at a low level of where the compression was placed last week mostly on his left lateral but also his left medial leg and a small area on the anterior part of the left foot. 05/09/16; apparently the patient went home after his appointment on 7/4 later in the evening developing pain in his upper medial thigh together with subjective fever and chills although his temperature was not taken. The pain was so intense he felt he would probably have to call 911. However he then remembered that he had leftover doxycycline from a previous round of antibiotics and took these. By the next morning he felt a lot better. He called and spoke to one of our nurses and I approved doxycycline over the phone thinking that this was in relation to the wounds we had previously seen although they were definitely were not. The patient feels a lot better old fever no chills he is still working. Blood sugars are reasonably controlled 05/13/16; patient is back in for review of his cellulitis on his anterior medial upper thigh. He is taking doxycycline this is a lot better. Culture I did of the nodular area on the dorsal aspect of his foot grew MRSA this also looks a lot better. 05/20/16; the patient is cellulitis on the medial upper thigh has resolved. All of his wound areas including the left anterior foot, areas on the medial aspect of the left calf and the lateral aspect of the calf at all resolved. He has a new blister on the left dorsal foot at the level of the fourth toe this was excised. No evidence of infection 05/27/16; patient continues to complain weeping edema. He has new blisterlike wounds on the left anterior lateral and posterior lateral calf at the top of his wrap levels.  The area on his left anterior foot appears better. He is not complaining of fever, pain or pruritus in his feet. 05/30/16; the patient's blisters on his left anterior leg posterior calf all look improved. He did not increase the Lasix 100 mg as I suggested because he was going to run out of his 40 mg tablets. He is still having weeping edema of his toes 06/03/16; I renewed his  Lasix at 80 mg once a day as he was about to run out when I last saw him. He is on 80 mg of Lasix now. I have asked him to cut down on the excessive amount of water he was drinking and asked him to drink according to his thirst mechanisms 06/12/2016 -- was seen 2 days ago and was supposed to wear his compression stockings at home but he is developed lymphedema and superficial blisters on the left lower extremity and hence came in for a review 06/24/16; the remaining wound is on his left anterior leg. He still has edema coming from between his toes. There is lymphedema here however his edema is generally better than when I last saw this. He has a history of atrial fibrillation but does not have a known history of congestive heart failure nevertheless I think he probably has this at least on a diastolic basis. 07/01/16 I reviewed his echocardiogram from January 2017. This was essentially normal. He did not have LVH, EF of 55-60%. His right ventricular function was normal although he did have trivial tricuspid and pulmonic regurgitation. This is not audible on exam however. I increased his Lasix to do massive edema in his legs well above his knees I think in early July. He was also drinking an excessive amount of water at the time. 07/15/16; missed his appointment last week because of the Labor Day holiday on Monday. He could not get another appointment later in the week. Started to feel the wrap digging in superiorly so we remove the top half and the bottom half of his wrap. He has extensive erythema and blistering superiorly in the  left leg. Very tender. Very swollen. Edema in his foot with leaking edema fluid. He has not been systemically unwell 07/22/16; the area on the left leg laterally required some debridement. The medial wounds look more stable. His wrap injury wounds appear to have healed. Edema and his foot is better, weeping edema is also better. He tells me he is meeting with the supplier of the external compression pumps at work 08/05/16; the patient was on vacation last week in [facility]. His wrap is been on for an extended period of time. Also over the weekend he developed an extensive area of tender erythema across his anterior medial thigh. He took to doxycycline yesterday that he had leftover from a previous prescription. The patient complains of weeping edema coming out of his toes 08/08/16; I saw this patient on 10/2. He was tender across his anterior thigh. I put him on doxycycline. He returns today in follow-up. He does not have any open wounds on his lower leg, he still has edema weeping into his toes. 08/12/16; patient was seen back urgently today to follow-up for his extensive left thigh cellulitis/erysipelas. He comes back with a lot less swelling and erythema pain is much better. I believe I gave him Augmentin and Cipro. His wrap was cut down as he stated a roll down his legs. He developed blistering above the level of the wrap that remained. He has 2 open blisters and 1 intact. 08/19/16; patient is been doing his primary doctor who is increased his Lasix from 40-80 once a day or 80 already has less edema. Cellulitis has remained improved in the left thigh. 2 open areas on the posterior left calf 08/26/16; he returns today having new open blisters on the anterior part of his left leg. He has his compression pumps but is not yet been shown how  to use some vital representative from the supplier. 09/02/16 patient returns today with no open wounds on the left leg. Some maceration in his plantar  toes 09/10/2016 -- Dr. Leanord Hawking had recently discharged him on 09/02/2016 and he has come right back with redness swelling and some open ulcers on his left lower extremity. He says this was caused by trying to apply his compression stockings and he's been unable to use this and has not been able to use his lymphedema pumps. He had some doxycycline leftover and he has started on this a few days ago. 09/16/16; there are no open wounds on his leg on the left and no evidence of cellulitis. 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 He does not complain of symptoms here. I am not clear use using his external compression pumps. 09/23/16; I have not seen this patient in 2 weeks. He canceled his appointment 10 days ago as he was going on vacation. He tells me that on Monday he noticed a large area on his posterior left leg which is been draining copiously and is reopened into a large wound. He is been using ABDs and the external part of his juxtalite, according to our nurse this was not on properly. 10/07/16; Still a substantial area on the posterior left leg. Using silver alginate 10/14/16; in general better although there is still open area which looks healthy. Still using silver alginate. He reminds me that this happen before he left for [facility]. T oday while he was showering in the morning. He had been using his juxtalite's 10/21/16; the area on his posterior left leg is fully epithelialized. However he arrives today with a large area of tender erythema in his medial and posterior left thigh just above the knee. I have marked the area. Once again he is reluctant to consider hospitalization. I treated him with oral antibiotics in the past for a similar situation with resolution I think with doxycycline however this area it seems more extensive to me. He is not complaining of fever but does have chills and says states he is thirsty. His blood sugar  today was in the 140s at home 10/25/16 the area on his posterior left leg is fully epithelialized although there is still some weeping edema. The large area of tenderness and erythema in his medial and posterior left thigh is a lot less tender although there is still a lot of swelling in this thigh. He states he feels a lot better. He is on doxycycline and Augmentin that I started last week. This will continued until Tuesday, December 26. I have ordered a duplex ultrasound of the left thigh rule out DVT whether there is an abscess something that would need to be drained I would also like to know. 11/01/16; he still has weeping edema from a not fully epithelialized area on his left posterior calf. Most of the rest of this looks a lot better. He has completed his antibiotics. His thigh is a lot better. Duplex ultrasound did not show a DVT in the thigh 11/08/16; he comes in today with more Denuded surface epithelium from the posterior aspect of his calf. There is no real evidence of cellulitis. The superior aspect of his wrap appears to have put quite an indentation in his leg just below the knee and this may have contributed. He does not complain of pain or fever. We have been using silver alginate as the primary dressing. The area  of cellulitis in the right thigh has totally resolved. He has been using his compression stockings once a week 11/15/16; the patient arrives today with more loss of epithelium from the posterior aspect of his left calf. He now has a fairly substantial wound in this area. The reason behind this deterioration isn't exactly clear although his edema is not well controlled. He states he feels he is generally more swollen systemically. He is not complaining of chest pain shortness of breath fever. T me he has an appointment with his primary physician in early February. He is on 80 mg of oral ells Lasix a day. He claims compliance with the external compression pumps. He is not having  any pain in his legs similar to what he has with his recurrent cellulitis 11/22/16; the patient arrives a follow-up of his large area on his left lateral calf. This looks somewhat better today. He came in earlier in the week for a dressing change since I saw him a week ago. He is not complaining of any pain no shortness of breath no chest pain 11/28/16; the patient arrives for follow-up of his large area on the left lateral calf this does not look better. In fact it is larger weeping edema. The surface of the wound does not look too bad. We have been using silver alginate although I'm not certain that this is a dressing issue. 12/05/16; again the patient follows up for a large wound on the left lateral and left posterior calf this does not look better. There continues to be weeping edema necrotic surface tissue. More worrisome than this once again there is erythema below the wound involving the distal Achilles and heel suggestive of cellulitis. He is on his feet working most of the day of this is not going well. We are changing his dressing twice a week to facilitate the drainage. 12/12/16; not much change in the overall dimensions of the large area on the left posterior calf. This is very inflamed looking. I gave him an. Doxycycline last week does not really seem to have helped. He found the wrap very painful indeed it seems to of dog into his legs superiorly and perhaps around the heel. He came in early today because the drainage had soaked through his dressings. 12/19/16- patient arrives for follow-up evaluation of his left lower extremity ulcers. He states that he is using his lymphedema pumps once daily when there is "no drainage". He admits to not using his lipedema pumps while under current treatment. His blood sugars have been consistently between 150-200. 12/26/16; the patient is not using his compression pumps at home because of the wetness on his feet. I've advised him that I think it's important for  him to use this daily. He finds his feet too wet, he can put a plastic bag over his legs while he is in the pumps. Otherwise I think will be in a vicious circle. We are using silver alginate to the major area on his left posterior calf 01/02/17; the patient's posterior left leg has further of all into 3 open wounds. All of them covered with a necrotic surface. He claims to be using his compression pumps once a day. His edema control is marginal. Continue with silver alginate 01/10/17; the patient's left posterior leg actually looks somewhat better. There is less edema, less erythema. Still has 3 open areas covered with a necrotic surface requiring debridement. He claims to be using his compression pumps once a day his edema control is  better 01/17/17; the patient's left posterior calf look better last week when I saw him and his wrap was changed 2 days ago. He has noted increasing pain in the left heel and arrives today with much larger wounds extensive erythema extending down into the entire heel area especially tender medially. He is not systemically unwell CBGs have been controlled no fever. Our intake nurse showed me limegreen drainage on his AVD pads. 01/24/17; his usual this patient responds nicely to antibiotics last week giving him Levaquin for presumed Pseudomonas. The whole entire posterior part of his leg is much better much less inflamed and in the case of his Achilles heel area much less tender. He has also had some epithelialization posteriorly there are still open areas here and still draining but overall considerably better 01/31/17- He has continue to tolerate the compression wraps. he states that he continues to use the lymphedema pumps daily, and can increase to twice daily on the weekends. He is voicing no complaints or concerns regarding his LLE ulcers 02/07/17-he is here for follow-up evaluation. He states that he noted some erythema to the left medial and anterior thigh, which he states  is new as of yesterday. He is concerned about recurrent cellulitis. He states his blood sugars have been slightly elevated, this morning in the 180s 02/14/17; he is here for follow-up evaluation. When he was last here there was erythema superiorly from his posterior wound in his anterior thigh. He was prescribed Levaquin however a culture of the wound surface grew MRSA over the phone I changed him to doxycycline on Monday and things seem to be a lot better. 02/24/17; patient missed his appointment on Friday therefore we changed his nurse visit into a physician visit today. Still using silver alginate on the large area of the posterior left thigh. He isn't new area on the dorsal left second toe 03/03/17; actually better today although he admits he has not used his external compression pumps in the last 2 days or so because of work responsibilities over the weekend. 03/10/17; continued improvement. External compression pumps once a day almost all of his wounds have closed on the posterior left calf. Better edema control 03/17/17; in general improved. He still has 3 small open areas on the lateral aspect of his left leg however most of the area on the posterior part of his leg is epithelialized. He has better edema control. He has an ABD pad under his stocking on the right anterior lower leg although he did not let us look at that today. 03/24/17; patient arrives back in clinic today with no open areas however there are areas on the posterior left calf and anterior left calf that are less than 100% epithelialized. His edema is well controlled in the left lower leg. There is some pitting edema probably lymphedema in the left upper thigh. He uses compression pumps at home once per day. I tried to get him to do this twice a day although he is very reticent. 04/01/2017 -- for the last 2 days he's had significant redness, tenderness and weeping and came in for an urgent visit today. 04/07/17; patient still has 6 more  days of doxycycline. He was seen by Dr. Meyer Russel last Wednesday for cellulitis involving the posterior aspect, lateral aspect of his Involving his heel. For the most part he is better there is less erythema and less weeping. He has been on his feet for 12 hours o2 over the weekend. Using his compression pumps once a day  04/14/17 arrives today with continued improvement. Only one area on the posterior left calf that is not fully epithelialized. He has intense bilateral venous inflammation associated with his chronic venous insufficiency disease and secondary lymphedema. We have been using silver alginate to the left posterior calf wound In passing he tells [place] today that the right leg but we have not seen in quite some time has an open area on it but he doesn't want [place] to look at this today states he will show this to [place] next week. 04/21/17; there is no open area on his left leg although he still reports some weeping edema. He showed [place] his right leg today which is the first time we've seen this leg in a long time. He has a large area of open wound on the right leg anteriorly healthy granulation. Quite a bit of swelling in the right leg and some degree of venous inflammation. He told us about the right leg in passing last week but states that deterioration in the right leg really only happened over the weekend 04/28/17; there is no open area on the left leg although there is an irritated part on the posterior which is like a wrap injury. The wound on the right leg which was new from last week at least to [place] is a lot better. 05/05/17; still no open area on the left leg. Patient is using his new compression stocking which seems to be doing a good job of controlling the edema. He states he is using his compression pumps once per day. The right leg still has an open wound although it is better in terms of surface area. Required debridement. A lot of pain in the posterior right Achilles marked tenderness. Usually  this type of presentation this patient gives concern for an active cellulitis 05/12/17; patient arrives today with his major wound from last week on the right lateral leg somewhat better. Still requiring debridement. He was using his compression stocking on the left leg however that is reopened with superficial wounds anteriorly he did not have an open wound on this leg previously. He is still using his juxta light's once daily at night. He cannot find the time to do this in the morning as he has to be at work by 7 AM 05/19/17; right lateral leg wound looks improved. No debridement required. The concerning area is on the left posterior leg which appears to almost have a subcutaneous hemorrhagic component to it. We've been using silver alginate to all the wounds 05/26/17; the right lateral leg wound continues to look improved. However the area on the left posterior calf is a tightly adherent surface. [place] using silver alginate. Because of the weeping edema in his legs there is very little good alternatives. 06/02/17; the patient left here last week looking quite good. Major wound on the left posterior calf and a small one on the right lateral calf. Both of these look satisfactory. He tells me that by Wednesday he had noted increased pain in the left leg and drainage. He called on Thursday and Friday to get an appointment here but we were blocked. He did not go to urgent care or his primary physician. He thinks he had a fever on Thursday but did not actually take his temperature. He has not been using his compression pumps on the left leg because of pain. I advised him to go to the emergency room today for IV antibiotics for stents of left leg cellulitis but he has refused  I have asked him to take 2 days off work to keep his leg elevated and he has refused this as well. In view of this I'm going to call him and Augmentin and doxycycline. He tells me he took some leftover doxycycline starting on Friday  previous cultures of the left leg have grown MRSA 06/09/2017 -- the patient has florid cellulitis of his left lower extremity with copious amount of drainage and there is no doubt in my mind that he needs inpatient care. However after a detailed discussion regarding the risk benefits and alternatives he refuses to get admitted to the hospital. With no other recourse I will continue him on oral antibiotics as before and hopefully he'll have his infectious disease consultation this week. 06/16/2017 -- the patient was seen today by the nurse practitioner at infectious disease Ms. Dixon. 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 He has also been advise Lotrimin cream locally. she also had wise IV antibiotics for his condition if it flares up 06/23/17; patient arrives today with drainage bilaterally although the remaining wound on the left posterior calf after cleaning up today "highlighter yellow drainage" did not look too bad. Unfortunately he has had breakdown on the right anterior leg [previously this leg had not been open and he is using a black stocking] he went to see infectious disease and is been put on clindamycin 150 mg daily, I did not verify the dose although I'm not familiar with using clindamycin in this dosing range, perhaps for prophylaxisoo 06/27/17; I brought this patient back today to follow-up on the wound deterioration on the right lower leg together with surrounding cellulitis. I started him on doxycycline 4 days ago. This area looks better however he comes in today with intense cellulitis on the medial part of his left thigh. This is not have a wound in this area. Extremely tender. We've been using silver alginate to the wounds on the right lower leg left lower leg with bilateral 4 layer compression he is  using his external compression pumps once a day 07/04/17; patient's left medial thigh cellulitis looks better. He has not been using his compression pumps as his insert said it was contraindicated with cellulitis. His right leg continues to make improvements all the wounds are still open. We only have one remaining wound on the left posterior calf. Using silver alginate to all open areas. He is on doxycycline which I started a week ago and should be finishing I gave him Augmentin after Thursday's visit for the severe cellulitis on the left medial thigh which fortunately looks better 07/14/17; the patient's left medial thigh cellulitis has resolved. The cellulitis in his right lower calf on the right also looks better. All of his wounds are stable to improved we've been using silver alginate he has completed the antibiotics I have given him. He has clindamycin 150 mg once a day prescribed by infectious disease for prophylaxis, I've advised him to start this now. We have been using bilateral Unna boots over silver alginate to the wound areas 07/21/17; the patient is been to see infectious disease who noted his recurrent problems with cellulitis. He was not able to tolerate prophylactic clindamycin therefore he is on amoxicillin 500 twice a day. He also had a second daily dose of Lasix added By Dr. Oneta Rack  but he is not taking this. Nor is he being completely compliant with his compression pumps a especially not this week. He has 2 remaining wounds one on the right posterior lateral lower leg and one on the left posterior medial lower leg. 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 The patient has Unna boots bilaterally. 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 He has bilateral Unna  boots for compression and silver alginate to his wounds. Arrives today with his legs looking as good as I have seen him in quite some time. Not surprisingly his wounds look better as well with improvement on the right lateral leg venous insufficiency wound and also the left medial leg. He is still using the compression pumps once a day 08/11/17; both legs appear to be doing better wounds on the right lateral and left medial legs look better. Skin on the right leg quite good. He is been using silver alginate as the primary dressing. I'm going to use Anasept gel calcium alginate and maintain all the secondary dressings 08/18/17; the patient continues to actually do quite well. The area on his right lateral leg is just about closed the left medial also looks better although it is still moist in this area. His edema is well controlled we have been using Anasept gel with calcium alginate and the usual secondary dressings, 4 layer compression and once daily use of his compression pumps "always been able to manage 09/01/17; the patient continues to do reasonably well in spite of his trip to [place]. The area on the right lateral leg is epithelialized. Left is much better but still open. He has more edema and more chronic erythema on the left leg [venous inflammation] 09/08/17; he arrives today with no open wound on the right lateral leg and decently controlled edema. Unfortunately his left leg is not nearly as in his good situation as last week.he apparently had increasing edema starting on Saturday. He edema soaked through into his foot so used a plastic bag to walk around his home. The area on the medial right leg which was his open area is about the same however he has lost surface epithelium on the left lateral which is new and he has significant pain in the Achilles area of the left foot. He is already on amoxicillin chronically for prophylaxis of cellulitis in the left leg 09/15/17; he is completed a  week of doxycycline and the cellulitis in the left posterior leg and Achilles area is as usual improved. He still has a lot of edema and fluid soaking through his dressings. There is no open wound on the right leg. He saw infectious disease NP today 09/22/17;As usual 1 we transition him from our compression wraps to his stockings things did not go well. He has several small open areas on the right leg. He states this was caused by the compression wrap on his skin although he did not wear this with the stockings over them. He has several superficial areas on the left leg medially laterally posteriorly. He does not have any evidence of active cellulitis especially involving the left Achilles The patient is traveling from [facility] Saturday going to [facility]. He states he isn't attempting to get an appointment with a heel objects wound center there to change  his dressings. I am not completely certain whether this will work 10/06/17; the patient came in on Friday for a nurse visit and the nurse reported that his legs actually look quite good. He arrives in clinic today for his regular follow-up visit. He has a new wound on his left third toe over the PIP probably caused by friction with his footwear. He has small areas on the left leg and a very superficial but epithelialized area on the right anterior lateral lower leg. Other than that his legs look as good as I've seen him in quite some time. We have been using silver alginate Review of systems; no chest pain no shortness of breath other than this a 10 point review of systems negative 10/20/17; seen by Dr. Meyer Russel last week. He had taken some antibiotics [doxycycline] that he had left over. Dr. Meyer Russel thought he had candida infection and declined to give him further antibiotics. He has a small wound remaining on the right lateral leg several areas on the left leg including a larger area on the left posterior several left medial and anterior and a  small wound on the left lateral. The area on the left dorsal third toe looks a lot better. ROS; Gen.; no fever, respiratory no cough no sputum Cardiac no chest pain other than this 10 point review of system is negative 10/30/17; patient arrives today having fallen in the bathtub 3 days ago. It took him a while to get up. He has pain and maceration in the wounds on his left leg which have deteriorated. He has not been using his pumps he also has some maceration on the right lateral leg. 11/03/17; patient continues to have weeping edema especially in the left leg. This saturates his dressings which were just put on on 12/27. As usual the doxycycline seems to take care of the cellulitis on his lower leg. He is not complaining of fever, chills, or other systemic symptoms. He states his leg feels a lot better on the doxycycline I gave him empirically. He also apparently gets injections at his primary doctor's officeo Rocephin for cellulitis prophylaxis. I didn't ask him about his compression pump compliance today I think that's probably marginal. Arrives in the clinic with all of his dressings primary and secondary macerated full of fluid and he has bilateral edema 11/10/17; the patient's right leg looks some better although there is still a cluster of wounds on the right lateral. The left leg is inflamed with almost circumferential skin loss medially to laterally although we are still maintaining anteriorly. He does not have overt cellulitis there is a lot of drainage. He is not using compression pumps. We have been using silver alginate to the wound areas, there are not a lot of options here 11/17/17; the patient's right leg continues to be stable although there is still open wounds, better than last week. The inflammation in the left leg is better. Still loss of surface layer epithelium especially posteriorly. There is no overt cellulitis in the amount of edema and his left leg is really quite good, tells  me he is using his compression pumps once a day. 11/24/17; patient's right leg has a small superficial wound laterally this continues to improve. The inflammation in the left leg is still improving however we have continuous surface layer epithelial loss posteriorly. There is no overt cellulitis in the amount of edema in both legs is really quite good. He states he is using his compression pumps on the left leg  once a day for 5 out of 7 days 12/01/17; very small superficial areas on the right lateral leg continue to improve. Edema control in both legs is better today. He has continued loss of surface epithelialization and left posterior calf although I think this is better. We have been using silver alginate with large number of absorptive secondary dressings 4 layer on the left Unna boot on the right at his request. He tells me he is using his compression pumps once a day 12/08/17; he has no open area on the right leg is edema control is good here. ooOn the left leg however he has marked erythema and tenderness breakdown of skin. He has what appears to be a wrap injury just distal to the popliteal fossa. 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 There was swelling erythema and breakdown in the right leg. He has much worse skin breakdown in the left leg as well multiple open areas medially and posteriorly as well as laterally. He tells me he has been using his compression pumps but tells me he feels that the drainage out of his leg is worse when he uses a compression pumps. T be fair to him he is been saying this o for a while however I don't know that I have really been listening to this. I wonder if the compression pumps are  working properly 12/22/17;. Once again he arrives with severe erythema, weeping edema from the left greater than right leg. Noncompliance with compression pumps. New this visit he is complaining of pain on the lateral aspect of the right leg and the medial aspect of his right thigh. He apparently saw his cardiologist Dr. [name] who was ordered an echocardiogram area and I think this is a step in the right direction 12/25/17; started his doxycycline Monday night. There is still intense erythema of the right leg especially in the anterior thigh although there is less tenderness. The erythema around the wound on the right lateral calf also is less tender. He still complaining of pain in the left heel. His wounds are about the same right lateral left medial left lateral. Superficial but certainly not close to closure. He denies being systemically unwell no fever chills no abdominal pain no diarrhea 12/29/17; back in follow-up of his extensive right calf and right thigh cellulitis. I added amoxicillin to cover possible doxycycline resistant strep. This seems to of done the trick he is in much less pain there is much less erythema and swelling. He has his echocardiogram at 11:00 this morning. X-ray of the left heel was also negative. 01/05/18; the patient arrived with his edema under much better control. Now that he is retired he is able to use his compression pumps daily and sometimes twice a day per the patient. He has a wound on the right leg the lateral wound looks better. Area on the left leg also looks a lot better. He has no evidence of cellulitis in his bilateral thighs I had a quick peak at his echocardiogram. He is in normal ejection fraction and normal left ventricular function. He has moderate pulmonary hypertension moderately reduced right ventricular function. One would have to wonder about chronic sleep apnea although he says he doesn't snore. He'll  review the echocardiogram with his  cardiologist. 01/12/18; the patient arrives with the edema in both legs under exemplary control. He is using his compression pumps daily and sometimes twice daily. His wound on the right lateral leg is just about closed. He still has some weeping areas on the posterior left calf and lateral left calf although everything is just about closed here as well. I have spoken with Aldean Baker who is the patient's nurse practitioner and infectious disease. She was concerned that the patient had not understood that the parenteral penicillin injections he was receiving for cellulitis prophylaxis was actually benefiting him. I don't think the patient actually saw that I would tend to agree we were certainly dealing with less infections although he had a serious one last month. 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 He is healed. He'll be placed in juxtalite compression wraps and increase his lymphedema pumps to twice daily. We will follow up again next week to ensure there are no issues with the new regiment. 01/20/18-he is here for evaluation of bilateral lower extremity weeping edema. Yesterday he was placed in compression wrap to the right lower extremity and compression stocking to left lower shrubbery. He states he uses lymphedema pumps last night and again this morning and noted a blister to the left lower extremity. On exam he was noted to have drainage to the right lower extremity. He will be placed in Unna boots bilaterally and follow-up next week 01/26/18; patient was actually discharged a week ago to his own juxta light stockings only to return the next day with bilateral lower extremity weeping edema.he was placed in bilateral Unna boots. He arrives today with pain in the back of his left leg. There is no open area on the right leg however there is a linear/wrap injury on the left leg and weeping edema on the left leg posteriorly. I spoke with infectious disease  about 10 days ago. They were disappointed that the patient elected to discontinue prophylactic intramuscular penicillin shots as they felt it was particularly beneficial in reducing the frequency of his cellulitis. I discussed this with the patient today. He does not share this view. He'll definitely need antibiotics today. Finally he is traveling to [place] and trauma leaving this Saturday and returning a week later and he does not travel with his pumps. He is going by car 01/30/18; patient was seen 4 days ago and brought back in today for review of cellulitis in the left leg posteriorly. I put him on amoxicillin this really hasn't helped as much as I might like. He is also worried because he is traveling to [facility] trauma by car. Finally we will be rewrapping him. There is no open area on the right leg over his left leg has multiple weeping areas as usual 02/09/18; The same wrap on for 10 days. He did not pick up the last doxycycline I prescribed for him. He apparently took 4 days worth he already had. There is nothing open on his right leg and the edema control is really quite good. He's had damage in the left leg medially and laterally especially probably related to the prolonged use of Unna boots 02/12/18; the patient arrived in clinic today for a nurse visit/wrap change. He complained of a lot of pain in the left posterior calf. He is taking doxycycline that I previously prescribed for him. Unfortunately even though he used his stockings and apparently used to compression pumps twice a day  he has weeping edema coming out of the lateral part of his right leg. This is coming from the lower anterior lateral skin area. 02/16/18; the patient has finished his doxycycline and will finish the amoxicillin 2 days. The area of cellulitis in the left calf posteriorly has resolved. He is no longer having any pain. He tells me he is using his compression pumps at least once a day sometimes twice. 02/23/18; the  patient finished his doxycycline and Amoxil last week. On Friday he noticed a small erythematous circle about the size of a quarter on the left lower leg just above his ankle. This rapidly expanded and he now has erythema on the lateral and posterior part of the thigh. This is bright red. Also has an area on the dorsal foot just above his toes and a tender area just below the left popliteal fossa. He came off his prophylactic penicillin injections at his own insistence one or 2 months ago. This is obviously deteriorated since then 03/02/18; patient is on doxycycline and Amoxil. Culture I did last week of the weeping area on the back of his left calf grew group B strep. I have therefore renewed the amoxicillin 500 3 times a day for a further week. He has not been systemically unwell. Still complaining of an area of discomfort right under his left popliteal fossa. There is no open wound on the right leg. He tells me that he is using his pumps twice a day on most days 03/09/18; patient arrives in clinic today completing his amoxicillin today. The cellulitis on his left leg is better. Furthermore he tells me that he had intramuscular penicillin shots that his primary care office today. However he also states that the wrap on his right leg fell down shortly after leaving clinic last week. He developed a large blister that was present when he came in for a nurse visit later in the week and then he developed intense discomfort around this area.He tells me he is using his compression pumps 03/16/18; the patient has completed his doxycycline. The infectious part of this/cellulitis in the left heel area left popliteal area is a lot better. He has 2 open areas on the right calf. Still areas on the left calf but this is a lot better as well. 03/24/18; the patient arrives complaining of pain in the left popliteal area again. He thinks some of this is wrap injury. He has no open area on the right leg and really no open  area on the left calf either except for the popliteal area. He claims to be compliant with the compression pumps 03/31/18; I gave him doxycycline last week because of cellulitis in the left popliteal area. This is a lot better although the surface epithelium is denuded off and response to this. He arrives today with uncontrolled edema in the right calf area as well as a fingernail injury in the right lateral calf. There is only a few open areas on the left 04/06/18; I gave him amoxicillin doxycycline over the last 2 weeks that the amoxicillin should be completing currently. He is not complaining of any pain or systemic symptoms. The only open areas see has is on the right lateral lower leg paradoxically I cannot see anything on the left lower leg. He tells me he is using his compression pumps twice a day on most days. Silver alginate to the wounds that are open under 4 layer compression 04/13/18; he completed antibiotics and has no new complaints. Using his  compression pumps. Silver alginate that anything that's opened 04/20/18; he is using his compression pumps religiously. Silver alginate 4 layer compression anything that's opened. He comes in today with no open wounds on the left leg but 3 on the right including a new one posteriorly. He has 2 on the right lateral and one on the right posterior. He likes Unna boots on the right leg for reasons that aren't really clear we had the usual 4 layer compression on the left. It may be necessary to move to the 4 layer compression on the right however for now I left them in the Unna boots 04/27/18; he is using his compression pumps at least once a day. He has still the wounds on the right lateral calf. The area right posteriorly has closed. He does not have an open wound on the left under 4 layer compression however on the dorsal left foot just proximal to the toes and the left third toe 2 small open areas were identified 05/11/18; he has not uses compression pumps.  The areas on the right lateral calf have coalesced into one large wound necrotic surface. On the left side he has one small wound anteriorly however the edema is now weeping out of a large part of his left leg. He says he wasn't using his pumps because of the weeping fluid. I explained to him that this is the time he needs to pump more 05/18/18; patient states he is using his compression pumps twice a day. The area on the right lateral large wound albeit superficial. On the left side he has innumerable number of small new wounds on the left calf particularly laterally but several anteriorly and medially. All these appear to have healthy granulated base these look like the remnants of blisters however they occurred under compression. The patient arrives in clinic today with his legs somewhat better. There is certainly less edema, less multiple open areas on the left calf and the right anterior leg looks somewhat better as well superficial and a little smaller. However he relates pain and erythema over the last 3-4 days in the thigh and I looked at this today. He has not been systemically unwell no fever no chills no change in blood sugar values 05/25/18; comes in today in a better state. The severe cellulitis on his left leg seems better with the Keflex. Not as tender. He has not been systemically unwell ooHard to find an open wound on the left lower leg using his compression pumps twice a day ooThe confluent wounds on his right lateral calf somewhat better looking. These will ultimately need debridement I didn't do this today. 06/01/18; the severe cellulitis on the left anterior thigh has resolved and he is completed his Keflex. ooThere is no open wound on the left leg however there is a superficial excoriation at the base of the third toe dorsally. Skin on the bottom of his left foot is macerated looking. ooThe left the wounds on the lateral right leg actually looks some better although he did  require debridement of the top half of this wound area with an open curet 06/09/18 on evaluation today patient appears to be doing poorly in regard to his right lower extremity in particular this appears to likely be infected he has very thick purulent discharge along with a bright green tent to the discharge. This makes me concerned about the possibility of pseudomonas. He's also having increased discomfort at this point on evaluation. Fortunately there does not appear to  be any evidence of infection spreading to the other location at this time. 06/16/18 on evaluation today patient appears to actually be doing fairly well. His ulcer has actually diminished in size quite significantly at this point which is good news. Nonetheless he still does have some evidence of infection he did see infectious disease this morning before coming here for his appointment. I did review the results of their evaluation and their note today. They did actually have him discontinue the Cipro and initiate treatment with linezolid at this time. He is doing this for the next seven days and they recommended a follow-up in four months with them. He is the keep a log of the need for intermittent antibiotic therapy between now and when he falls back up with infectious disease. This will help them gaze what exactly they need to do to try and help them out. 06/23/18; the patient arrives today with no open wounds on the left leg and left third toe healed. He is been using his compression pumps twice a day. On the right lateral leg he still has a sizable wound but this is a lot better than last time I saw this. 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 Has been using silver alginate under 4 layer compression 06/30/18; the only open wound he has is on the right lateral leg and this looks healthy. No debridement is required. We have been  using silver alginate. He does not have an open wound on the left leg. There is apparently some drainage from the dorsal proximal third toe on the left although I see no open wound here. 07/03/18 on evaluation today patient was actually here just for a nurse visit rapid change. However when he was here on Wednesday for his rat change due to having been healed on the left and then developing blisters we initiated the wrap again knowing that he would be back today for [place] to reevaluate and see were at. Unfortunately he has developed some cellulitis into the proximal portion of his right lower extremity even into the region of his thigh. 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 He was placed on one as what at that point. Nonetheless he is done with that and has been tolerating it well otherwise. Doxycycline which in the past really did not seem to be effective for him. Nonetheless I think the best option may be for [place] to definitely reinitiate the antibiotics for a longer period of time. 07/07/18; since I last saw this patient a week ago he has had a difficult time. At that point he did not have an open wound on his left leg. We transitioned him into juxta light stockings. He was apparently in the clinic the next day with blisters on the left lateral and left medial lower calf. He also had weeping edema fluid. He was put back into a compression wrap. He was also in the clinic on Friday with intense erythema in his right thigh. Per the patient he was started on Bactrim however that didn't work at all in terms of relieving his pain and swelling. He has taken 3 doxycycline that he had left over from last time and that seems to of helped. He has blistering on the right thigh as well. 07/14/18; the erythema on his right thigh has gotten better with doxycycline that he  is finishing. The culture that I did of a blister on the right lateral calf just below his knee grew MRSA  resistant to doxycycline. Presumably this cellulitis in the thigh was not related to that although I think this is a bit concerning going forward. He still has an area on the right lateral calf the blister on the right medial calf just below the knee that was discussed above. On the left 2 small open areas left medial and left lateral. Edema control is adequate. He is using his compression pumps twice a day 07/20/18; continued improvement in the condition of both legs especially the edema in his bilateral thighs. He tells me he is been losing weight through a combination of diet and exercise. He is using his compression pumps twice a day. So overall she made to the remaining wounds 07/27/2018; continued improvement in condition of both legs. His edema is well controlled. The area on the right lateral leg is just about closed he had one blisters show up on the medial left upper calf. We have him in 4 layer compression. He is going on a 10-day trip to [place], [place]. He will be driving. He wants to wear Unna boots because of the lessening amount of constriction. He will not use compression pumps while he is away 08/05/18 on evaluation today patient actually appears to be doing decently well all things considered in regard to his bilateral lower extremities. The worst ulcer is actually only posterior aspect of his left lower extremity with a four layer compression wrap cut into his leg a couple weeks back. He did have a trip and actually had [employer] for the trip that he is worn since he was last here. Nonetheless he feels like the [employer] actually do better for him his swelling is up a little bit but he also with his trip was not taking his Lasix on a regular set schedule like he was supposed to be. He states that obviously the reason being that he cannot drive and keep going without having to urinate too frequently which makes it difficult. He did not have his pumps with  him while he was away either which I think also maybe playing a role here too. 08/13/2018; the patient only has a small open wound on the right lateral calf which is a big improvement in the last month or 2. He also has the area posteriorly just below the posterior fossa on the left which I think was a wrap injury from several weeks ago. He has no current evidence of cellulitis. He tells me he is back into his compression pumps twice a day. He also tells me that while he was at the laundromat somebody stole a section of his extremitease stockings 08/20/2018; back in the clinic with a much improved state. He only has small areas on the right lateral mid calf which is just about healed. This was is more substantial area for quite a prolonged period of time. He has a small open area on the left anterior tibia. The area on the posterior calf just below the popliteal fossa is closed today. He is using his compression pumps twice a day 08/28/2018; patient has no open wound on the right leg. He has a smattering of open areas on the calf with some weeping lymphedema. More problematically than that it looks as though his wraps of slipped down in his usual he has very angry upper area of edema  just below the right medial knee and on the right lateral calf. He has no open area on his feet. The patient is traveling to [facility] next week. I will send him in an antibiotic. We will continue to wrap the right leg. We ordered extremitease stockings for him last week and I plan to transition the right leg to a stocking when he gets home which will be in 10 days time. As usual he is very reluctant to take his pumps with him when he travels 09/07/2018; patient returns from [facility]. He shows me a picture of his left leg in the mid part of his trip last week with intense fire engine erythema. The picture look bad enough I would have considered sending him to the hospital. Instead he went to the wound care center in  [facility]. They did not prescribe him antibiotics but he did take some doxycycline he had leftover from a previous visit. I had given him trimethoprim sulfamethoxazole before he left this did not work according to the patient. This is resulted in some improvement fortunately. He comes back with a large wound on the left posterior calf. Smaller area on the left anterior tibia. Denuded blisters on the dorsal left foot over his toes. Does not have much in the way of wounds on the right leg although he does have a very tender area on the right posterior area just below the popliteal fossa also suggestive of infection. He promises me he is back on his pumps twice a day 09/15/2018; the intense cellulitis in his left lower calf is a lot better. The wound area on the posterior left calf is also so better. However he has reasonably extensive wounds on the dorsal aspect of his second and third toes and the proximal foot just at the base of the toes. There is nothing open on the right leg 09/22/2018; the patient has excellent edema control in his legs bilaterally. He is using his external compression pumps twice a day. He has no open area on the right leg and only the areas in the left foot dorsally second and third toe area on the left side. He does not have any signs of active cellulitis. 10/06/2018; the patient has good edema control bilaterally. He has no open wound on the right leg. There is a blister in the posterior aspect of his left calf that we had to deal with today. He is using his compression pumps twice a day. There is no signs of active cellulitis. We have been using silver alginate to the wound areas. He still has vulnerable areas on the base of his left first second toes dorsally He has a his extremities stockings and we are going to transition him today into the stocking on the right leg. He is cautioned that he will need to continue to use the compression pumps twice a day. If he  notices uncontrolled edema in the right leg he may need to go to 3 times a day. 10/13/2018; the patient came in for a nurse check on Friday he has a large flaccid blister on the right medial calf just below the knee. We unroofed this. He has this and a new area underneath the posterior mid calf which was undoubtedly a blister as well. He also has several small areas on the right which is the area we put his extremities stocking on. 10/19/2018; the patient went to see infectious disease this morning I am not sure if that was  a routine follow-up in any case the doxycycline I had given him was discontinued and started on linezolid. He has not started this. It is easy to look at his left calf and the inflammation and think this is cellulitis however he is very tender in the tissue just below the popliteal fossa and I have no doubt that there is infection going on here. He states the problem he is having is that with the compression pumps the edema goes down and then starts walking the wrap falls down. We will see if we can adhere this. He has 1 or 2 minuscule open areas on the right still areas that are weeping on the posterior left calf, the base of his left second and third toes 10/26/18; back today in clinic with quite of skin breakdown in his left anterior leg. This may have been infection the area below the popliteal fossa seems a lot better however tremendous epithelial loss on the left anterior mid tibia area over quite inexpensive tissue. He has 2 blisters on the right side but no other open wound here. 10/29/2018; came in urgently to see [place] today and we worked him in for review. He states that the 4 layer compression on the right leg caused pain he had to cut it down to roughly his mid calf this caused swelling above the wrap and he has blisters and skin breakdown today. As a result of the pain he has not been using his pumps. Both legs are a lot more edematous and there is a lot of weeping  fluid. 11/02/18; arrives in clinic with continued difficulties in the right leg> left. Leg is swollen and painful. multiple skin blisters and new open areas especially laterally. He has not been using his pumps on the right leg. 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 He is not systemically unwell. 11/09/2018; the patient claims he is being compliant with his pumps. He is finished the doxycycline I gave him last week. Culture I did of the wound on the right lateral leg showed a few very resistant methicillin staph aureus. This was resistant to doxycycline. Nevertheless he states the pain in the leg is a lot better which makes me wonder if the cultured organism was not really what was causing the problem nevertheless this is a very dangerous organism to be culturing out of any wound. His right leg is still a lot larger than the left. He is using an Radio broadcast assistant on this area, he blames a 4-layer compression for causing the original skin breakdown which I doubt is true however I cannot talk him out of it. We have been using silver alginate to all of these areas which were initially blisters 11/16/2018; patient is being compliant with his external compression pumps at twice a day. Miraculously he arrives in clinic today with absolutely no open wounds. He has better edema control on the left where he has been using 4 layer compression versus wound of wounds on the right and I pointed this out to him. There is no inflammation in the skin in his lower legs which is also somewhat unusual for him. There is no open wounds on the dorsal left foot. He has extremitease stockings at home and I have asked him to bring these in next week. 11/25/18 patient's lower extremity on examination today on the left appears for the most part to be wound free. He does have an open wound on the lateral aspect  of the right lower extremity but this is minimal compared to what I've seen in  past. He does request that we go ahead and wrap the left leg as well even though there's nothing open just so hopefully it will not reopen in short order. 1/28; patient has superficial open wounds on the right lateral calf left anterior calf and left posterior calf. His edema control is adequate. 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 We have been using silver alginate as the primary dressing. He claims compliance with his compression pumps 2/4; patient has superficial open wounds on numerous areas of his left calf and again one on the left dorsal foot. The areas on the right lateral calf have healed. The cellulitis that I gave him doxycycline for last week is also resolved this was mostly on the left anterior calf just below the tibial tuberosity. His edema looks fairly well-controlled. He tells me he went to see his primary doctor today and had blood work ordered 2/11; once again he has several open areas on the left calf left tibial area. Most of these are small and appear to have healthy granulation. He does not have anything open on the right. The edema and control in his thighs is pretty good which is usually a good indication he has been using his pumps as requested. 2/18; he continues to have several small areas on the left calf and left tibial area. Most of these are small healthy granulation. We put him in his stocking on the right leg last week and he arrives with a superficial open area over the right upper tibia and a fairly large area on the right lateral tibia in similar condition. His edema control actually does not look too bad, he claims to be using his compression pumps twice a day 2/25. Continued small areas on the left calf and left tibial area. New areas especially on the right are identified just below the tibial tuberosity and on the right upper tibia itself. There are also areas of weeping  edema fluid even without an obvious wound. He does not have a considerable degree of lymphedema but clearly there is more edema here than his skin can handle. He states he is using the pumps twice a day. We have an Unna boot on the right and 4 layer compression on the left. 3/3; he continues to have an area on the right lateral calf and right posterior calf just below the popliteal fossa. There is a fair amount of tenderness around the wound on the popliteal fossa but I did not see any evidence of cellulitis, could just be that the wrap came down and rubbed in this area. ooHe does not have an open area on the left leg however there is an area on the left dorsal foot at the base of the third toe ooWe have been using silver alginate to all wound areas 3/10; he did not have an open area on his left leg last time he was here a week ago. T oday he arrives with a horizontal wound just below the tibial tuberosity and an area on the left lateral calf. He has intense erythema and tenderness in this area. The area is on the right lateral calf and right posterior calf better than last week. We have been using silver alginate as usual 3/18 - Patient returns with 3 small open areas on left calf, and 1  small open area on right calf, the skin looks ok with no significant erythema, he continues the UNA boot on right and 4 layer compression on left. The right lateral calf wound is closed , the right posterior is small area. we will continue silver alginate to the areas. 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 He is been tolerating the dressing changes without complication. Fortunately she has made excellent improvement in regard to the overall status of his wounds. Unfortunately every time we cease wrapping him he ends  up reopening in causing more significant issues at that point. Again I'm unsure of the best direction to take although I think the lymphedema clinic may be appropriate for him. 02/03/19 on evaluation today patient appears to be doing well in regard to the wounds that we saw him for last week unfortunately he has a new area on the proximal portion of his right medial/posterior lower extremity where the wrap somewhat slowed down and caused swelling and a blister to rub and open. Unfortunately this is the only opening that he has on either leg at this point. 02/17/19 on evaluation today patient's bilateral lower extremities appear to be doing well. He still completely healed in regard to the left lower extremity. In regard to the right lower extremity the area where the wrap and slid down and caused the blister still seems to be slightly open although this is dramatically better than during the last evaluation two weeks ago. I'm very pleased with the way this stands overall. 03/03/19 on evaluation today patient appears to be doing well in regard to his right lower extremity in general although he did have a new blister open this does not appear to be showing any evidence of active infection at this time. Fortunately there's No fevers, chills, nausea, or vomiting noted at this time. Overall I feel like he is making good progress it does feel like that the right leg will we perform the [employer] seems to do with a bit better than three layer wrap on the left which slid down on him. We may switch to doing bilateral in the book wraps. 5/4; I have not seen Mr. Getchell in quite some time. According to our case manager he did not have an open wound on his left leg last week. He had 1 remaining wound on the right posterior medial calf. He arrives today with multiple openings on the left leg probably were blisters and/or wrap injuries from Unna boots. I do not think the Unna boot's will provide adequate compression on  the left. I am also not clear about the frequency he is using the compression pumps. 03/17/19 on evaluation today patient appears to be doing excellent in regard to his lower extremities compared to last week's evaluation apparently. He had gotten significantly worse last week which is unfortunate. The [employer] wrap on the left did not seem to do very well for him at all and in fact it didn't control his swelling significantly enough he had an additional outbreak. Subsequently we go back to the four layer compression wrap on the left. This is good news. At least in that he is doing better and the wound seem to be killing him. He still has not heard anything from the lymphedema clinic. 03/24/19 on evaluation today patient actually  appears to be doing much better in regard to his bilateral lower Trinity as compared to last week when I saw him. Fortunately there's no signs of active infection at this time. He has been tolerating the dressing changes without complication. Overall I'm extremely pleased with the progress and appearance in general. 04/07/19 on evaluation today patient appears to be doing well in regard to his bilateral lower extremities. His swelling is significantly down from where it was previous. With that being said he does have a couple blisters still open at this point but fortunately nothing that seems to be too severe and again the majority of the larger openings has healed at this time. 04/14/19 on evaluation today patient actually appears to be doing quite well in regard to his bilateral lower extremities in fact I'm not even sure there's anything significantly open at this time at any site. Nonetheless he did have some trouble with these wraps where they are somewhat irritating him secondary to the fact that he has noted that the graph wasn't too close down to the end of this foot in a little bit short as well up to his knee. Otherwise things seem to be doing quite well. 04/21/19 upon  evaluation today patient's wound bed actually showed evidence of being completely healed in regard to both lower extremities which is excellent news. There does not appear to be any signs of active infection which is also good news. I'm very pleased in this regard. No fevers, chills, nausea, or vomiting noted at this time. 04/28/19 on evaluation today patient appears to be doing a little bit worse in regard to both lower extremities on the left mainly due to the fact that when he went infection disease the wrap was not wrapped quite high enough he developed a blister above this. On the right he is a small open area of nothing too significant but again this is continuing to give him some trouble he has been were in the Velcro compression that he has at home. 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 He's been tolerating the dressing changes without complication. Fortunately there's no signs of active infection at this time. No fevers, chills, nausea, or vomiting noted at this time. We have been trying to get an appointment with her lymphedema clinic in [facility] but unfortunately nobody can get them on phone with not been able to even fax information over the patient likewise is not been able to get in touch with them. Overall I'm not sure exactly what's going on here with to reach out again today. 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 Still having a lot of drainage unfortunately. He tells me especially in the left but even on the right. There's no signs of active infection which is good news we've been using so ratcheted up to this point. 05/19/19 on evaluation today patient actually appears to be doing quite well with regard to his left lower extremity which is great news. Fortunately in regard to the right lower extremity has an  issues with his wrap and he subsequently did remove this from what I'm understanding. Nonetheless long story short is what he had rewrapped once he removed it subsequently had maggots underneath this wrap whenever he came in for evaluation today. With that being said they were obviously completely cleaned away by the nursing staff. The  visit today which is excellent news. However he does appear to potentially have some infection around the right ankle region where the maggots were located as well. He will likely require anabiotic therapy today. 05/26/19 on evaluation today patient actually appears to be doing much better in regard to his bilateral lower extremities. I feel like the infection is under much better control. With that being said there were maggots noted when the wrap was removed yet again today. Again this could have potentially been left over from previous although at this time there does not appear to be any signs of significant drainage there was obviously on the wrap some drainage as well this contracted gnats or otherwise. Either way I do not see anything that appears to be doing worse in my pinion and in fact I think his drainage has slowed down quite significantly likely mainly due to the fact to his infection being under better control. 06/02/2019 on evaluation today patient actually appears to be doing well with regard to his bilateral lower extremities there is no signs of active infection at this time which is great news. With that being said he does have several open areas more so on the right than the left but nonetheless these are all significantly better than previously noted. 06/09/2019 on evaluation today patient actually appears to be doing well. His wrap stayed up and he did not cause any problems he had more drainage on the right compared to the left but overall I do not see any major issues at this time which is great news. 06/16/2019 on evaluation today patient appears to  be doing excellent with regard to his lower extremities the only area that is open is a new blister that can have opened as of today on the medial ankle on the left. Other than this he really seems to be doing great I see no major issues at this point. 06/23/2019 on evaluation today patient appears to be doing quite well with regard to his bilateral lower extremities. In fact he actually appears to be almost completely healed there is a small area of weeping noted of the right lower extremity just above the ankle. Nonetheless fortunately there is no signs of active infection at this time which is good news. No fevers, chills, nausea, vomiting, or diarrhea. 8/24; the patient arrived for a nurse visit today but complained of very significant pain in the left leg and therefore I was asked to look at this. Noted that he did not have an open area on the left leg last week nevertheless this was wrapped. The patient states that he is not been able to put his compression pumps on the left leg because of the discomfort. He has not been systemically unwell 06/30/2019 on evaluation today patient unfortunately despite being excellent last week is doing much worse with regard to his left lower extremity today. In fact he had to come in for a nurse on Monday where his left leg had to be rewrapped due to excessive weeping Dr. Leanord Hawking placed him on doxycycline at that point. Fortunately there is no signs of active infection Systemically at this time which is good news. 07/07/2019 in regard to the patient's wounds today he actually seems to be doing well with his right lower extremity there really is nothing open or draining at this point this is great news. Unfortunately the left lower extremity is given him additional trouble at this time. There does not appear to be any signs of active  infection nonetheless he does have a lot of edema and swelling noted at this point as well as blistering all of which has led to a much  more poor appearing leg at this time compared to where it was 2 weeks ago when it was almost completely healed. Obviously this is a little discouraging for the patient. He is try to contact the lymphedema clinic in [place] he has not been able to get through to them. 07/14/2019 on evaluation today patient actually appears to be doing slightly better with regard to his left lower extremity ulcers. Overall I do feel like at least at the top of the wrap that we have been placing this area has healed quite nicely and looks much better. The remainder of the leg is showing signs of improvement. Unfortunately in the thigh area he still has an open region on the left and again on the right he has been utilizing just a Band-Aid on an area that also opened on the thigh. Again this is an area that were not able to wrap although we did do an Ace wrap to provide some compression that something that obviously is a little less effective than the compression wraps we have been using on the lower portion of the leg. He does have an appointment with the lymphedema clinic in [facility] on Friday. 07/21/2019 on evaluation today patient appears to be doing better with regard to his lower extremity ulcers. He has been tolerating the dressing changes without complication. Fortunately there is no signs of active infection at this time. No fevers, chills, nausea, vomiting, or diarrhea. I did receive the paperwork from the physical therapist at the lymphedema clinic in [place]. Subsequently I signed off on that this morning and sent that back to him for further progression with the treatment plan. 07/28/2019 on evaluation today patient appears to be doing very well with regard to his right lower extremity where I do not see any open wounds at this point. Fortunately he is feeling great as far as that is concerned as well. In regard to the left lower extremity he has been having issues with still several areas  of weeping and edema although the upper leg is doing better his lower leg still I think is going require the compression wrap at this time. No fevers, chills, nausea, vomiting, or diarrhea. 08/04/2019 on evaluation today patient unfortunately is having new wounds on the right lower extremity. Again we have been using Unna boot wrap on that side. We switched him to using his juxta lite wrap at home. With that being said he tells me he has been using it although his legs extremely swollen and to be honest really does not appear that he has been. I cannot know that for sure however. Nonetheless he has multiple new wounds on the right lower extremity at this time. Obviously we will have to see about getting this rewrapped for him today. 08/11/2019 on evaluation today patient appears to be doing fairly well with regard to his wounds. He has been tolerating the dressing changes including the compression wraps without complication. He still has a lot of edema in his upper thigh regions bilaterally he is supposed to be seeing the lymphedema clinic on the 15th of this month once his wraps arrive for the upper part of his legs. 08/18/2019 on evaluation today patient appears to be doing well with regard to his bilateral lower extremities at this point. He has been tolerating the dressing changes without complication. Fortunately  there is no signs of active infection which is also good news. He does have a couple weeping areas on the first and second toe of the right foot he also has just a small area on the left foot upper leg and a small area on the left lower leg but overall he is doing quite well in my opinion. He is supposed to be getting his wraps shortly in fact tomorrow and then subsequently is seeing the lymphedema clinic next Wednesday on the 21st. Of note he is also leaving on the 25th to go on vacation for a week to the beach. For that reason and since there is some uncertainty about what there can  be doing at lymphedema clinic next Wednesday I am get a make an appointment for next Friday here for [place] to see what we need to do for him prior to him leaving for vacation. 10/23; patient arrives in considerable pain predominantly in the upper posterior calf just distal to the popliteal fossa also in the wound anteriorly above the major wound. This is probably cellulitis and he has had this recurrently in the past. He has no open wound on the right side and he has had an Radio broadcast assistant in that area. Finally I note that he has an area on the left posterior calf which by enlarge is mostly epithelialized. This protrudes beyond the borders of the surrounding skin in the setting of dry scaly skin and lymphedema. The patient is leaving for [facility] on Sunday. Per his longstanding pattern, he will not take his compression pumps with him predominantly out of fear that they will be stolen. He therefore asked that we put a Unna boot back on the right leg. He will also contact the wound care center in [facility] to see if they can change his dressing in the mid week. 11/3; patient returned from his vacation to [facility]. He was seen on 1 occasion at their wound care center. They did a 2 layer compression system as they did not have our 4-layer wrap. I am not completely certain what they put on the wounds. They did not change the Unna boot on the right. The patient is also seeing a lymphedema specialist physical therapist in [place]. It appears that he has some compression sleeve for his thighs which indeed look quite a bit better than I am used to seeing. He pumps over these with his external compression pumps. 11/10; the patient has a new wound on the right medial thigh otherwise there is no open areas on the right. He has an area on the left leg posteriorly anteriorly and medially and an area over the left second toe. We have been using silver alginate. He thinks the injury on his thigh is secondary  to friction from the compression sleeve he has. 11/17; the patient has a new wound on the right medial thigh last week. He thinks this is because he did not have a underlying stocking for his thigh juxta lite apparatus. He now has this. The area is fairly large and somewhat angry but I do not think he has underlying cellulitis. ooHe has a intact blister on the right anterior tibial area. ooSmall wound on the right great toe dorsally ooSmall area on the medial left calf. 11/30; the patient does not have any open areas on his right leg and we did not take his juxta lite stocking off. However he states that on Friday his compression wrap fell down lodging around his  upper mid calf area. As usual this creates a lot of problems for him. He called urgently today to be seen for a nurse visit however the nurse visit turned into a provider visit because of extreme erythema and pain in the left anterior tibia extending laterally and posteriorly. The area that is problematic is extensive 10/06/2019 upon evaluation today patient actually appears to be doing poorly in regard to his left lower extremity. He Dr. Leanord Hawking did place him on doxycycline this past Monday apparently due to the fact that he was doing much worse in regard to this left leg. Fortunately the doxycycline does seem to be helping. Unfortunately we are still having a very difficult time getting his edema under any type of control in order to anticipate discharge at some point. The only way were really able to control his lymphedema really is with compression wraps and that has only even seemingly temporary. He has been seeing a lymphedema clinic they are trying to help in this regard but still this has been somewhat frustrating in general for the patient. 10/13/19 on evaluation today patient appears to be doing excellent with regard to his right lower extremity as far as the wounds are concerned. His swelling is still quite extensive  unfortunately. He is still having a lot of drainage from the thigh areas bilaterally which is unfortunate. He's been going to lymphedema clinic but again he still really does not have this edema under control as far as his lower extremities are concern. With regard to his left lower extremity this seems to be improving and I do believe the doxycycline has been of benefit for him. He is about to complete the doxycycline. 10/20/2019 on evaluation today patient appears to be doing poorly in regard to his bilateral lower extremities. More in the right thigh he has a lot of irritation at this site unfortunately. In regard to the left lower extremity the wrap was not quite as high it appears and does seem to have caused him some trouble as well. Fortunately there is no evidence of systemic infection though he does have some blue-green drainage which has me concerned for the possibility of Pseudomonas. He tells me he is previously taking Cipro without complications and he really does not care for Levaquin however due to some of the side effects he has. He is not allergic to any medications specifically antibiotics that were aware of. 10/27/2019 on evaluation today patient actually does appear to be for the most part doing better when compared to last week's evaluation. With that being said he still has multiple open wounds over the bilateral lower extremities. He actually forgot to start taking the Cipro and states that he still has the whole bottle. He does have several new blisters on left lower extremity today I think I would recommend he go ahead and take the Cipro based on what I am seeing at this point. 12/30-Patient comes at 1 week visit, 4 layer compression wraps on the left and Unna boot on the right, primary dressing Xtrasorb and silver alginate. Patient is taking his Cipro and has a few more days left probably 5-6, and the legs are doing better. He states he is using his compressions devices which I  believe he has 11/10/2019 on evaluation today patient actually appears to be much better than last time I saw him 2 weeks ago. His wounds are significantly improved and overall I am very pleased in this regard. Fortunately there is no signs of active infection  at this time. He is just a couple of days away from completing Cipro. Overall his edema is much better he has been using his lymphedema pumps which I think is also helping at this point. 11/17/2019 on evaluation today patient appears to be doing excellent in regard to his wounds in general. His legs are swollen but not nearly as much as they have been in the past. Fortunately he is tolerating the compression wraps without complication. No fevers, chills, nausea, vomiting, or diarrhea. He does have some erythema however in the distal portion of his right lower extremity specifically around the forefoot and toes there is a little bit of warmth here as well. 11/24/2019 on evaluation today patient appears to be doing well with regard to his right lower extremity I really do not see any open wounds at this point. His left lower extremity does have several open areas and his right medial thigh also is open. Other than this however overall the patient seems to be making good progress and I am very pleased at this point. 12/01/2019 on evaluation today patient appears to be doing poorly at this point in regard to his left lower extremity has several new blisters despite the fact that we have him in compression wraps. In fact he had a 4-layer compression wrap, his upper thigh wrapped from lymphedema clinic, and a juxta light over top of the 4 layer compression wrap the lymphedema clinic applied and despite all this he still develop blisters underneath. Obviously this does have me concerned about the fact that unfortunately despite what we are doing to try to get wounds healed he continues to have new areas arise I do not think he is ever good to be at the  point where he can realistically just use wraps at home to keep things under control. Typically when we heal him it takes about 1-2 days before he is back in the clinic with severe breakdown and blistering of his lower extremities bilaterally. This is happened numerous times in the past. Unfortunately I think that we may need some help as far as overall fluid overload to kind of limit what we are seeing and get things under better control. 12/08/2019 on evaluation today patient presents for follow-up concerning his ongoing bilateral lower extremity edema. Unfortunately he is still having quite a bit of swelling the compression wraps are controlling this to some degree but he did see Dr. [name] his cardiologist I do have that available for review today as far as the appointment was concerned that was on 12/06/2019. Obviously that she has been 2 days ago. The patient states that he is only been taking the Lasix 80 mg 1 time a day he had told me previously he was taking this twice a day. Nonetheless Dr. [name] recommended this be up to 80 mg 2 times a day for the patient as he did appear to be fluid overloaded. With that being said the patient states he did this yesterday and he was unable to go anywhere or do anything due to the fact that he was constantly having to urinate. Nonetheless I think that this is still good to be something that is important for him as far as trying to get his edema under control at all things that he is going to be able to just expect his wounds to get under control and things to be better without going through at least a period of time where he is trying to stabilize his fluid management  in general and I think increasing the Lasix is likely the first step here. It was also mentioned the possibility that the patient may require metolazone. With that being said he wanted to have the patient take Lasix twice a day first and then reevaluating 2 months to see where things  stand. 12/15/2019 upon evaluation today patient appears to be doing regard to his legs although his toes are showing some signs of weeping especially on the left at this point to some degree on the right. There does not appear to be any signs of active infection and overall I do feel like the compression wraps are doing well for him but he has not been able to take the Lasix at home and the increased dose that Dr. [name] recommended. He tells me that just not go to be feasible for him. Nonetheless I think in this case he should probably send a message to Dr. [name] in order to discuss options from the standpoint of possible admission to get the fluid off or otherwise going forward. 12/22/2019 upon evaluation today patient appears to be doing fairly well with regard to his lower extremities at this point. In fact he would be doing excellent if it was not for the fact that his right anterior thigh apparently had an allergic reaction to adhesive tape that he used. The wound itself that we have been monitoring actually appears to be healed. There is a lot of irritation at this point. 12/29/2019 upon evaluation today patient appears to be doing well in regard to his lower extremities. His left medial thigh is open and somewhat draining today but this is the only region that is open the right has done much better with the treatment utilizing the steroid cream that I prescribed for him last week. Overall I am pleased in that regard. Fortunately there is no signs of active infection at this time. No fevers, chills, nausea, vomiting, or diarrhea. 01/05/2020 upon evaluation today patient appears to be doing more poorly in regard to his right lower extremity at this point upon evaluation today. Unfortunately he continues to have issues in this regard and I think the biggest issue is controlling his edema. This obviously is not very well controlled at this point is been recommended that he use the Lasix twice a day but  he has not been able to do that. Unfortunately I think this is leading to an issue where honestly he is not really able to effectively control his edema and therefore the wounds really are not doing significantly better. I do not think that he is going to be able to keep things under good control unless he is able to control his edema much better. I discussed this again in great detail with him today. 01/12/2020 good news is patient actually appears to be doing quite well today at this point. He does have an appointment with lymphedema clinic tomorrow. His legs appear healed and the toe on the left is almost completely healed. In general I am very pleased with how things stand at this point. 01/19/2020 upon evaluation today patient appears to actually be doing well in regard to his lower extremities there is nothing open at this point. Fortunately he has done extremely well more recently. Has been seeing lymphedema clinic as well. With that being said he has Velcro wraps for his lower legs as well as his upper legs. The only wound really is on his toe which is the right great toe and this is  barely anything even there. With all that being said I think it is good to be appropriate today to go ahead and switch him over to the Velcro compression wraps. 01/26/2020 upon evaluation today patient appears to be doing worse with regard to his lower extremities after last week switch him to Velcro compression wraps. Unfortunately he lasted less than 24 hours he did not have the sock portion of his Velcro wrap on the left leg and subsequently developed a blister underneath the Velcro portion. Obviously this is not good and not what we were looking for at this point. He states the lymphedema clinic did tell him to wear the wrap for 23 hours and take him off for 1 I am okay with that plan but again right now we got a get things back under control again he may have some cellulitis noted as well. 02/02/2020 upon  evaluation today patient unfortunately appears to have several areas of blistering on his bilateral lower extremities today mainly on the feet. His legs do seem to be doing somewhat better which is good news. Fortunately there is no evidence of active infection at this time. No fevers, chills, nausea, vomiting, or diarrhea. 02/16/2020 upon evaluation today patient appears to be doing well at this time with regard to his legs. He has a couple weeping areas on his toes but for the most part everything is doing better and does appear to be sealed up on his legs which is excellent news. We can continue with wrapping him at this point as he had every time we discontinue the wraps he just breaks out with new wounds. There is really no point in is going forward with this at this point. 03/08/2020 upon evaluation today patient actually appears to be doing quite well with regard to his lower extremity ulcers. He has just a very superficial and really almost nonexistent blister on the left lower extremity he has in general done very well with the compression wraps. With that being said I do not see any signs of infection at this time which is good news. 03/29/2020 upon evaluation today patient appears to be doing well with regard to his wounds currently except for where he had several new areas that opened up due to some of the wrap slipping and causing him trouble. He states he did not realize they had slipped. Nonetheless he has a 1 area on the right and 3 new areas on the left. Fortunately there is no signs of active infection at this time which is great news. 04/05/2020 upon evaluation today patient actually appears to be doing quite well in general in regard to his legs currently. Fortunately there is no signs of active infection at this time. No fevers, chills, nausea, vomiting, or diarrhea. He tells me next week that he will actually be seen in the lymphedema clinic on Thursday at 10 AM I see him on Wednesday  next week. 04/12/2020 upon evaluation today patient appears to be doing very well with regard to his lower extremities bilaterally. In fact he does not appear to have any open wounds at this point which is good news. Fortunately there is no signs of active infection at this time. No fevers, chills, nausea, vomiting, or diarrhea. 04/19/2020 upon evaluation today patient appears to be doing well with regard to his wounds currently on the bilateral lower extremities. There does not appear to be any signs of active infection at this time. Fortunately there is no evidence of systemic infection and  overall very pleased at this point. Nonetheless after I held him out last week he literally had blisters the next morning already which swelled up with him being right back here in the clinic. Overall I think that he is just not can be able to be discharged with his legs the way they are he is much to volume overloaded as far as fluid is concerned and that was discussed with him today of also discussed this but should try the clinic nurse manager as well as Dr. Leanord Hawking. 04/26/2020 upon evaluation today patient appears to be doing better with regard to his wounds currently. He is making some progress and overall swelling is under good control with the compression wraps. Fortunately there is no evidence of active infection at this time. 05/10/2020 on evaluation today patient appears to be doing overall well in regard to his lower extremities bilaterally. He is Tolerating the compression wraps without complication and with what we are seeing currently I feel like that he is making excellent progress. There is no signs of active infection at this time. 05/24/2020 upon evaluation today patient appears to be doing well in regard to his legs. The swelling is actually quite a bit down compared to where it has been in the past. Fortunately there is no sign of active infection at this time which is also good news. With that being  said he does have several wounds on his toes that have opened up at this point. 05/31/2020 upon evaluation today patient appears to be doing well with regard to his legs bilaterally where he really has no significant fluid buildup at this point overall he seems to be doing quite well. Very pleased in this regard. With regard to his toes these also seem to be drying up which is excellent. We have continue to wrap him as every time we tried as a transition to the juxta light wraps things just do not seem to get any better. 06/07/2020 upon evaluation today patient appears to be doing well with regard to his right leg at this point. Unfortunately left leg has a lot of blistering he tells me the wrap started to slide down on him when he tried to put his other Velcro wrap over top of it to help keep things in order but nonetheless still had some issues. 06/14/2020 on evaluation today patient appears to be doing well with regard to his lower extremity ulcers and foot ulcers at this point. I feel like everything is actually showing signs of improvement which is great news overall there is no signs of active infection at this time. No fevers, chills, nausea, vomiting, or diarrhea. 06/21/2020 on evaluation today patient actually appears to be doing okay in regard to his wounds in general. With that being said the biggest issue I see is on his right foot in particular the first and second toe seem to be doing a little worse due to the fact this is staying very wet. I think he is probably getting need to change out his dressings a couple times in between each week when we see him in regard to his toes in order to keep this drier based on the location and how this is proceeding. 06/28/2020 on evaluation today patient appears to be doing a little bit more poorly overall in regard to the appearance of the skin I am actually somewhat concerned about the possibility of him having a little bit of an infection here. We  discussed the course of potentially  giving him a doxycycline prescription which he is taken previously with good result. With that being said I do believe that this is potentially mild and at this point easily fixed. I just do not want anything to get any worse. 07/12/2020 upon evaluation today patient actually appears to be making some progress with regard to his legs which is great news there does not appear to be any evidence of active infection. Overall very pleased with where things stand. 07/26/2020 upon evaluation today patient appears to be doing well with regard to his leg ulcers and toe ulcers at this point. He has been tolerating the compression wraps without complication overall very pleased in this regard. 08/09/2020 upon evaluation today patient appears to be doing well with regard to his lower extremities bilaterally. Fortunately there is no signs of active infection overall I am pleased with where things stand. 08/23/2020 on evaluation today patient appears to be doing well with regard to his wound. He has been tolerating the dressing changes without complication. Fortunately there is no signs of active infection at this time. Overall his legs seem to be doing quite well which is great news and I am very pleased in that regard. No fevers, chills, nausea, vomiting, or diarrhea. 09/13/2020 upon evaluation today patient appears to be doing okay in regard to his lower extremities. He does have a fairly large blister on the right leg which I did remove the blister tissue from today so we can get this to dry out other than that however he seems to be doing quite well. There is no signs of active infection at this time. 09/27/2020 upon evaluation today patient appears to actually be doing some better in regard to his right leg. Fortunately signs of active infection at this time which is great news. No fevers, chills, nausea, vomiting, or diarrhea. 10/04/2020 upon evaluation today patient  actually appears to be showing signs of improvement which is great news with regard to his leg ulcers. Fortunately there is no signs of active infection which is great news he is still taking the antibiotics currently. No fevers, chills, nausea, vomiting, or diarrhea. 10/18/2020 on evaluation today patient appears to be doing well with regard to his legs currently. He has been tolerating the dressing changes including the wraps without complication. Fortunately there is no signs of active infection at this time. No fevers, chills, nausea, vomiting, or diarrhea. 10/25/2020 upon evaluation today patient appears to be doing decently well in regard to his wounds currently. He has been tolerating the dressing changes without complication. Overall I feel like he is making good progress albeit slow. Again this is something we can have to continue to wrap for some time to come most likely. 11/08/2020 upon evaluation today patient appears to be doing well with regard to his wounds currently. He has been tolerating the dressing changes without complication is not currently on any antibiotics and he does not appear to show any signs of infection. He does continue to have a lot of drainage on the right leg not too severe but nonetheless this is very scattered. On the left leg this is looking to be much improved overall. 11/15/2020 upon evaluation today patient appears to be doing better with regard to his legs bilaterally. Especially the right leg which was much more significant last week. There does not appear to be any signs of active infection which is great news. No fevers, chills, nausea, vomiting, or diarrhea. 11/23/2019 upon evaluation today patient appears to be doing  poorly still in regard to his lower extremities bilaterally. Unfortunately his right leg in particular appears to be doing much more poorly there is no signs really of infection this is not warm to touch but he does have a lot of drainage and  weeping unfortunately. With that reason I do believe that we may need to initiate some treatment here to try to help calm down some of the swelling of the right leg. I think switching to a 4-layer compression wrap would be beneficial here. The patient is in agreement with giving this a try. 11/29/2020 upon evaluation today patient appears to be doing well currently in regard to his leg ulcers. I feel like the right leg is doing better he still has a lot of drainage but we do see some improvement here. The 4-layer compression wrap I think was helpful. 12/06/2020 upon evaluation today patient appears to be doing well with regard to his legs. In fact they seem to be doing about the best I have seen up to this point. Fortunately there is no signs of active infection at this time. No fevers, chills, nausea, vomiting, or diarrhea. 12/20/2020 upon evaluation today patient appears to be doing well at this time in regard to his legs. He is not having any significant draining which is great news. Fortunately there is no signs of active infection at this time. No fevers, chills, nausea, vomiting, or diarrhea. 01/17/2021 upon evaluation today Aayan actually appears to be doing excellent in regard to his legs. He has a few areas again that come and go as far as his toes are concerned but overall this is doing quite well. 01/31/2021 upon evaluation today patient appears to be doing well with regard to his legs. Fortunately there does not appear to be any signs of active infection which is great news. Overall he is still having significant edema despite the compression wraps basically the 4-layer compression wrap to just keep things under control there is really not much room for play. 4/13: [name] is a longstanding patient in our clinic and benefits greatly from weekly compression wraps. Today he has no complaints. He has been tolerating the wraps well. He states he is using the lymphedema pumps at home. 5/4;  patient presents for follow-up of his chronic lymphedema/venous insufficiency ulcers. He comes weekly for compression wraps. He has no complaints today. He was unable to tolerate the Coflex 2 layer Last week so we will do the four press 4-layer compression. He has been using his lymphedema pumps daily. 5/18; patient presents for 2-week follow-up. He has no complaints or issues today. He has developed a new wound to the right foot on his fourth toe. He overall feels well and denies signs of infection. 6/1; patient presents for 2-week follow-up. He has no complaints or issues today. He denies signs of infection. 04/18/2021 upon evaluation today patient appears to be doing well with regard to his legs bilaterally. Family open wound is actually on the toe of his left foot everything else is completely closed which is great news. In general I am extremely pleased with where things stand at this point. The patient is also happy that things are doing so well. 05/02/2021 upon evaluation today patient's legs actually appear to be doing quite well today. Fortunately there does not appear to be any signs of active infection which is great and overall I am extremely pleased with where he stands today. The patient does not appear to have any evidence of active  infection at this time which is also great news. 05/09/2021 upon evaluation today patient appears to be doing a little bit more poorly in regard to his legs. Unfortunately he is having issues with some breakdown and a blood blister on the left leg this is due to I believe honestly to how it was wrapped last week. Fortunately there does not appear to be any signs of infection but nonetheless this is still a concern to be honest. No fevers, chills, nausea, vomiting, or diarrhea. 05/16/2021 upon evaluation today patient appears to be doing significantly better as compared to last week. I am very pleased with where things stand today. There does not appear to be any  signs of infection which is great news and overall very pleased with where we stand. No fevers, chills, nausea, vomiting, or diarrhea. 05/30/2021 upon evaluation today patient appears to be doing well with regard to his legs. He has been tolerating the dressing changes without complication. Fortunately there does not appear to be any signs of active infection which is great news and overall I am extremely pleased with where things stand today. No fevers, chills, nausea, vomiting, or diarrhea. 06/20/2021 upon evaluation today patient actually appears to be making good progress today and very pleased with what we are seeing. I think his legs are really maintaining. As long as we continue wrapping he seems to be doing excellent in my opinion. Fortunately there is no signs of active infection at this time. No fevers, chills, nausea, vomiting, or diarrhea. 07/11/2021 upon evaluation today patient actually appears to be making excellent progress at this time. Fortunately there does not appear to be any evidence of active infection which is great news and overall I am extremely pleased with where things stand today. No fevers, chills, nausea, vomiting, or diarrhea. 07/25/2021 upon evaluation today patient appears to be doing well currently in regard to his lower extremities. He has been making good progress here and I do not see anything that is actually open significantly today this is great news. No fevers, chills, nausea, vomiting, or diarrhea. 08/08/2021 upon evaluation today patient appears to be doing well with regard to his wound. He has been tolerating the dressing changes without complication. With that being said unfortunately has a new area that opened up as far as his right posterior leg is concerned this was a blister he also has an area on the third toe right foot which also reopen. Fortunately there is no signs of active infection at this time which is great news. No fevers, chills, nausea, vomiting,  or diarrhea. 10/17; patient came in today at his request initially for a nurse visit because it but out of concern for deterioration in both his lower legs and cellulitis I was asked to look at him. He comes in with increased swelling which he says started over the weekend he started to notice pain as well in his left medial ankle, right knee, left knee left dorsal foot. His wraps fell down contributing to some of this but he has not been using his compression pumps over the weekend for reasons that are not really clear. He comes in with multiple new wounds including the right posterior leg, right third toe, right fourth toe, left second toe left medial ankle left dorsal ankle and right anterior lower leg 09/19/2021 upon evaluation today patient does seem to be making improvements in general which is great news. I do not see any evidence of infection currently he does have some hypergranulation  of the anterior portion of his ankle on the left side this is going require some debridement to pare this down and then subsequently silver nitrate probably due to the amount of bleeding that he is probably going to experience. He is in agreement with this plan however. 09/26/2021 upon evaluation today although the patient's legs appear to be doing okay today unfortunately he did have maggots noted during the evaluation as well. This is again quite unfortunate with the respect to the fact that he feels like that he got the wrap wet which was noted today he is not sure when and I think this is what led to the issue. Nonetheless we can try to see what we can do about silly things off so that this does not happen again in the future although he is can have to be diligent about taking care of his wraps as well. 10/03/2021 upon evaluation today patient appears to be doing okay in regard to his legs currently. He unfortunately has maggots again noted on the left foot. This obviously is becoming quite an issue to be  perfectly honest. I do think that he needs to see what can be done at home to try to limit this exposure. Last week we had cleaned everything away so this is a new infestation as far as that is concerned. There is really not much that I can do to combat that I am trying to do what we can here in the clinic to wrap his foot and try to prevent any access of that again with knots there is a small this becomes very difficult. 10/10/2021 upon evaluation today patient appears to be doing poorly in regard to his legs he is very swollen and unfortunately I think a big part of the issue here is simply that he is not taking his fluid pills appropriately stressed probably is not helping much at all either. He still having a lot of issues as far as getting moved and having to be out of his current living condition. With that being said he does have of note maggots noted on the right foot this time completely separate from what we have been seeing he tells me that he got his wraps wet again. Its mainly when it rains it sounds like that he goes out to his car in the area where he has to park there is a area that collects a fairly large puddle according to what he tells me today either way I explained to him which we have done before as well that if he gets his wraps wet he needs to let us know so we can get them changed out. He does not need to keep them on wet. Objective Constitutional Chronically ill appearing but in no apparent acute distress. Vitals Time Taken: 10:35 AM, Height: 70 in, Weight: 380.2 lbs, BMI: 54.5, Temperature: 98.2 F, Pulse: 74 bpm, Respiratory Rate: 22 breaths/min, Blood Pressure: 138/66 mmHg. Respiratory normal breathing without difficulty. Psychiatric this patient is able to make decisions and demonstrates good insight into disease process. Alert and Oriented x 3. pleasant and cooperative. General Notes: We did clear away as many of the maggots as possible I did not see anything  remaining once we were done today. Overall I think he is doing unfortunately quite poorly. I think he really needs to be doubling up on his fluid pills to try to get some of the excess fluid off else he is going to continue to have issues with ongoing  problems with his legs to be perfectly honest. Integumentary (Hair, Skin) Wound #199 status is Open. Original cause of wound was Gradually Appeared. The date acquired was: 08/08/2021. The wound has been in treatment 9 weeks. The wound is located on the Right T Second. The wound measures 2.5cm length x 1.7cm width x 0.1cm depth; 3.338cm^2 area and 0.334cm^3 volume. There oe is Fat Layer (Subcutaneous Tissue) exposed. There is no tunneling or undermining noted. There is a medium amount of serous drainage noted. The wound margin is distinct with the outline attached to the wound base. There is large (67-100%) red, pink granulation within the wound bed. There is no necrotic tissue within the wound bed. General Notes: >30 maggots noted in wound. Wound #203 status is Open. Original cause of wound was Gradually Appeared. The date acquired was: 08/20/2021. The wound has been in treatment 7 weeks. The wound is located on the Left,Anterior Ankle. The wound measures 0.5cm length x 1.3cm width x 0.1cm depth; 0.511cm^2 area and 0.051cm^3 volume. There is Fat Layer (Subcutaneous Tissue) exposed. There is no tunneling or undermining noted. There is a medium amount of serosanguineous drainage noted. The wound margin is distinct with the outline attached to the wound base. There is large (67-100%) red, pink granulation within the wound bed. There is a small (1-33%) amount of necrotic tissue within the wound bed including Adherent Slough. Wound #204 status is Open. Original cause of wound was Gradually Appeared. The date acquired was: 09/26/2021. The wound has been in treatment 2 weeks. The wound is located on the Left T Second. The wound measures 2.5cm length x 3.5cm  width x 0.1cm depth; 6.872cm^2 area and 0.687cm^3 volume. There oe is Fat Layer (Subcutaneous Tissue) exposed. There is no tunneling or undermining noted. There is a medium amount of serosanguineous drainage noted. The wound margin is flat and intact. There is large (67-100%) red granulation within the wound bed. There is no necrotic tissue within the wound bed. Wound #205 status is Healed - Epithelialized. Original cause of wound was Gradually Appeared. The date acquired was: 09/26/2021. The wound has been in treatment 2 weeks. The wound is located on the Left,Posterior Lower Leg. The wound measures 0cm length x 0cm width x 0cm depth; 0cm^2 area and 0cm^3 volume. There is a medium amount of serosanguineous drainage noted. Wound #206 status is Healed - Epithelialized. Original cause of wound was Gradually Appeared. The date acquired was: 10/03/2021. The wound has been in treatment 1 weeks. The wound is located on the Left,Dorsal Foot. The wound measures 0cm length x 0cm width x 0cm depth; 0cm^2 area and 0cm^3 volume. There is a medium amount of serosanguineous drainage noted. Wound #207 status is Open. Original cause of wound was Gradually Appeared. The date acquired was: 10/03/2021. The wound has been in treatment 1 weeks. The wound is located on the Left T Third. The wound measures 1.5cm length x 1.6cm width x 0.1cm depth; 1.885cm^2 area and 0.188cm^3 volume. There is oe Fat Layer (Subcutaneous Tissue) exposed. There is no tunneling or undermining noted. There is a medium amount of serosanguineous drainage noted. The wound margin is distinct with the outline attached to the wound base. There is large (67-100%) red, pink, hyper - granulation within the wound bed. There is a small (1-33%) amount of necrotic tissue within the wound bed including Adherent Slough. Wound #208 status is Open. Original cause of wound was Gradually Appeared. The date acquired was: 10/10/2021. The wound is located on the  Right  T Great. oe The wound measures 1cm length x 2.4cm width x 0.1cm depth; 1.885cm^2 area and 0.188cm^3 volume. There is Fat Layer (Subcutaneous Tissue) exposed. There is no tunneling or undermining noted. There is a medium amount of serosanguineous drainage noted. The wound margin is distinct with the outline attached to the wound base. There is medium (34-66%) red, pink granulation within the wound bed. There is a medium (34-66%) amount of necrotic tissue within the wound bed including Adherent Slough. General Notes: cluster of 2. maggots present x10. Wound #209 status is Open. Original cause of wound was Gradually Appeared. The date acquired was: 10/10/2021. The wound is located on the Right,Dorsal Foot. The wound measures 1.5cm length x 1cm width x 0.1cm depth; 1.178cm^2 area and 0.118cm^3 volume. There is Fat Layer (Subcutaneous Tissue) exposed. There is no tunneling or undermining noted. There is a medium amount of serosanguineous drainage noted. The wound margin is distinct with the outline attached to the wound base. There is medium (34-66%) red, pink granulation within the wound bed. There is a medium (34-66%) amount of necrotic tissue within the wound bed including Adherent Slough. 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 There was a Four Layer Compression Therapy Procedure by Shawn Stall, RN. Post procedure Diagnosis Wound #203: Same as Pre-Procedure Wound #209 Pre-procedure diagnosis of Wound #209 is a Lymphedema located on the Right,Dorsal Foot . There was a Four Layer Compression Therapy Procedure by Shawn Stall, RN. Post procedure Diagnosis Wound #209: Same as Pre-Procedure Plan Follow-up Appointments: Return Appointment in 1 week. Leonard Schwartz Wednesday ***extra time 75 minutes**** Call wound center if dressing get wet from rain call to see if wound center can add you to schedule as a nurse visit to change. Other: - ****double the diuretic daily for the week.*** Bathing/ Shower/ Hygiene: May shower with protection but do not get wound dressing(s) wet. Edema Control - Lymphedema / SCD / Other: Lymphedema Pumps. Use Lymphedema pumps on leg(s) 2-3 times a day for 45-60 minutes. If wearing any wraps or hose, do not remove them. Continue exercising as instructed. Elevate legs to the level of the heart or above for 30 minutes daily and/or when sitting, a frequency of: - throughout the day Avoid standing for long periods of time. Exercise regularly Other Edema Control Orders/Instructions: - right leg 4 layer compression with unna boot to secure to upper portion of lower leg. apply lotion to right leg. WOUND #199: - T Second Wound Laterality: Right oe Cleanser: Soap and Water 1 x Per Week/ Discharge Instructions: May shower and wash wound with dial antibacterial soap and water prior to dressing change. Cleanser: Wound Cleanser 1 x Per Week/ Discharge Instructions: Cleanse the wound with wound cleanser prior to applying a clean dressing using gauze sponges, not tissue or cotton balls. Peri-Wound Care: Zinc Oxide Ointment 30g tube 1 x Per Week/  Discharge Instructions: Apply Zinc Oxide to periwound with each dressing change Peri-Wound  Care: Sween Lotion (Moisturizing lotion) 1 x Per Week/ Discharge Instructions: Apply moisturizing lotion as directed Prim Dressing: KerraCel Ag Gelling Fiber Dressing, 4x5 in (silver alginate) 1 x Per Week/ ary Discharge Instructions: Apply silver alginate to wound bed as instructed Secured With: Coban Self-Adherent Wrap 4x5 (in/yd) 1 x Per Week/ Discharge Instructions: Secure with Coban apply lightly as directed. Secured With: Insurance underwriter, Sterile 2x75 (in/in) 1 x Per Week/ Discharge Instructions: Secure with stretch gauze as directed. WOUND #203: - Ankle Wound Laterality: Left, Anterior Cleanser: Soap and Water 1 x Per Week/ Discharge Instructions: May shower and wash wound with dial antibacterial soap and water prior to dressing change. Cleanser: Wound Cleanser 1 x Per Week/ Discharge Instructions: Cleanse the wound with wound cleanser prior to applying a clean dressing using gauze sponges, not tissue or cotton balls. Peri-Wound Care: Zinc Oxide Ointment 30g tube 1 x Per Week/ Discharge Instructions: Apply Zinc Oxide to periwound with each dressing change Peri-Wound Care: Sween Lotion (Moisturizing lotion) 1 x Per Week/ Discharge Instructions: Apply moisturizing lotion as directed Prim Dressing: KerraCel Ag Gelling Fiber Dressing, 4x5 in (silver alginate) 1 x Per Week/ ary Discharge Instructions: Apply silver alginate to wound bed as instructed Secondary Dressing: ABD Pad, 8x10 1 x Per Week/ Discharge Instructions: Apply over primary dressing as directed. Com pression Wrap: FourPress (4 layer compression wrap) 1 x Per Week/ Discharge Instructions: ****UNNA BOOT FIRST LAYER APPLIED TO UPPER PORTION OF LOWER LEG.*** WOUND #204: - T Second Wound Laterality: Left oe Cleanser: Soap and Water 1 x Per Week/ Discharge Instructions: May shower and wash wound with dial antibacterial soap and water prior to dressing change. Cleanser: Wound Cleanser 1 x Per Week/ Discharge  Instructions: Cleanse the wound with wound cleanser prior to applying a clean dressing using gauze sponges, not tissue or cotton balls. Peri-Wound Care: Zinc Oxide Ointment 30g tube 1 x Per Week/ Discharge Instructions: Apply Zinc Oxide to periwound with each dressing change Peri-Wound Care: Sween Lotion (Moisturizing lotion) 1 x Per Week/ Discharge Instructions: Apply moisturizing lotion as directed Prim Dressing: KerraCel Ag Gelling Fiber Dressing, 4x5 in (silver alginate) 1 x Per Week/ ary Discharge Instructions: Apply silver alginate to wound bed as instructed Secondary Dressing: gauze and zetuvit 1 x Per Week/ Discharge Instructions: apply gauze between the toes and apply zetuvit pads over the toes secure with the compression wrap to dorsal foot to aid in drainage. Secured With: Coban Self-Adherent Wrap 4x5 (in/yd) 1 x Per Week/ Discharge Instructions: Secure with Coban apply lightly as directed. Secured With: Insurance underwriter, Sterile 2x75 (in/in) 1 x Per Week/ Discharge Instructions: Secure with stretch gauze as directed. WOUND #207: - T Third Wound Laterality: Left oe Cleanser: Soap and Water 1 x Per Week/ Discharge Instructions: May shower and wash wound with dial antibacterial soap and water prior to dressing change. Cleanser: Wound Cleanser 1 x Per Week/ Discharge Instructions: Cleanse the wound with wound cleanser prior to applying a clean dressing using gauze sponges, not tissue or cotton balls. Peri-Wound Care: Zinc Oxide Ointment 30g tube 1 x Per Week/ Discharge Instructions: Apply Zinc Oxide to periwound with each dressing change Peri-Wound Care: Sween Lotion (Moisturizing lotion) 1 x Per Week/ Discharge Instructions: Apply moisturizing lotion as directed Prim Dressing: KerraCel Ag Gelling Fiber Dressing, 4x5 in (silver alginate) 1 x Per Week/ ary Discharge Instructions: Apply silver alginate to wound bed as instructed Secured With:  Coban Self-Adherent Wrap  4x5 (in/yd) 1 x Per Week/ Discharge Instructions: Secure with Coban apply lightly as directed. Secured With: Insurance underwriter, Sterile 2x75 (in/in) 1 x Per Week/ Discharge Instructions: Secure with stretch gauze as directed. WOUND #208: - T Great Wound Laterality: Right oe Cleanser: Soap and Water 1 x Per Week/ Discharge Instructions: May shower and wash wound with dial antibacterial soap and water prior to dressing change. Cleanser: Wound Cleanser 1 x Per Week/ Discharge Instructions: Cleanse the wound with wound cleanser prior to applying a clean dressing using gauze sponges, not tissue or cotton balls. Peri-Wound Care: Zinc Oxide Ointment 30g tube 1 x Per Week/ Discharge Instructions: Apply Zinc Oxide to periwound with each dressing change Peri-Wound Care: Sween Lotion (Moisturizing lotion) 1 x Per Week/ Discharge Instructions: Apply moisturizing lotion as directed Prim Dressing: KerraCel Ag Gelling Fiber Dressing, 4x5 in (silver alginate) 1 x Per Week/ ary Discharge Instructions: Apply silver alginate to wound bed as instructed Secured With: Coban Self-Adherent Wrap 4x5 (in/yd) 1 x Per Week/ Discharge Instructions: Secure with Coban apply lightly as directed. Secured With: Insurance underwriter, Sterile 2x75 (in/in) 1 x Per Week/ Discharge Instructions: Secure with stretch gauze as directed. WOUND #209: - Foot Wound Laterality: Dorsal, Right Cleanser: Soap and Water 1 x Per Week/ Discharge Instructions: May shower and wash wound with dial antibacterial soap and water prior to dressing change. Cleanser: Wound Cleanser 1 x Per Week/ Discharge Instructions: Cleanse the wound with wound cleanser prior to applying a clean dressing using gauze sponges, not tissue or cotton balls. Peri-Wound Care: Zinc Oxide Ointment 30g tube 1 x Per Week/ Discharge Instructions: Apply Zinc Oxide to periwound with each dressing change Peri-Wound Care: Sween Lotion (Moisturizing  lotion) 1 x Per Week/ Discharge Instructions: Apply moisturizing lotion as directed Prim Dressing: KerraCel Ag Gelling Fiber Dressing, 4x5 in (silver alginate) 1 x Per Week/ ary Discharge Instructions: Apply silver alginate to wound bed as instructed Secondary Dressing: ABD Pad, 8x10 1 x Per Week/ Discharge Instructions: Apply over primary dressing as directed. Com pression Wrap: FourPress (4 layer compression wrap) 1 x Per Week/ Discharge Instructions: ****UNNA BOOT FIRST LAYER APPLIED TO UPPER PORTION OF LOWER LEG.*** 1. Would recommend that we going to continue with wound care measures as before and the patient is in agreement with plan. This includes the use of the silver alginate dressing to any open wound locations. 2. Also can recommend that we have the patient continue 4-layer compression wraps which I think is can be still the best thing for him to do. 3. I am also going to suggest he should be using his fluid pills regularly and really needs to double up on that he is supposed to be taking 1 in the morning 1 in the evening he never does this and right now I think he needs to definitely step that up. 4. He should also contact us if he gets his wraps wet ASAP so he does not end up with issues with maggots as he been having each week for the past 3 weeks. We will see patient back for reevaluation in 1 week here in the clinic. If anything worsens or changes patient will contact our office for additional recommendations. Electronic Signature(s) Signed: 10/10/2021 4:49:10 PM By: Lenda Kelp PA-C Entered By: Lenda Kelp on 10/10/2021 16:49:09 -------------------------------------------------------------------------------- SuperBill Details Patient Name: Date of Service: [name], Seiya J. 10/10/2021 Medical Record Number: [id number] Patient Account Number: [id number] Date of Birth/Sex: Treating  RN: 01-22-1951 (70 y.o. 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 L97.812 Non-pressure chronic ulcer of other part of right lower leg with fat layer exposed I87.333 Chronic venous hypertension (idiopathic) with ulcer and inflammation of bilateral lower extremity L03.116 Cellulitis of left lower limb I89.0 Lymphedema, not elsewhere classified E11.40 Type 2 diabetes mellitus with diabetic neuropathy, unspecified E11.622 Type 2 diabetes mellitus with other skin ulcer L03.115 Cellulitis of right lower limb Facility Procedures CPT4: Code [id number] Description: 81 BILATERAL: Application of multi-layer venous compression system; leg (below knee), including ankle and t. 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

## 2021-10-10 NOTE — Progress Notes (Signed)
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 Quality: Adherent Slough Tendon Exposed: No Muscle Exposed: No Joint Exposed: No Bone Exposed: No Assessment Notes cluster of 2. maggots present x10. Treatment Notes Wound #208 (Toe Great) Wound Laterality: Right Cleanser Soap and Water Discharge Instruction: May shower and wash wound  with dial antibacterial soap and water prior to dressing change. Wound Cleanser Discharge Instruction: Cleanse the wound with wound cleanser prior to applying a clean dressing using gauze sponges, not tissue or cotton balls. Peri-Wound Care Zinc Oxide Ointment 30g tube Discharge Instruction: Apply Zinc Oxide to periwound with each dressing change Sween Lotion (Moisturizing lotion) Discharge Instruction: Apply moisturizing lotion as directed Topical Primary Dressing KerraCel Ag Gelling Fiber Dressing, 4x5 in (silver alginate) Discharge Instruction: Apply silver alginate to wound bed as instructed Secondary Dressing Secured With Coban Self-Adherent Wrap 4x5 (in/yd) Discharge Instruction: Secure with Coban apply lightly as directed. Conforming Stretch Gauze Bandage, Sterile 2x75 (in/in) Discharge Instruction: Secure with stretch gauze as directed. Compression Wrap Compression Stockings Add-Ons Electronic Signature(s) Signed: 10/10/2021 4:36:51 PM By: Deon Pilling RN, BSN Entered By: Deon Pilling on 10/10/2021 10:54:50 -------------------------------------------------------------------------------- Wound Assessment Details Patient Name: Date of Service: [name], [place] J. 10/10/2021 10:30 A M Medical Record Number: [id number] Patient Account Number: [id number] Date of Birth/Sex: Treating RN: [birth year] (70 y.o. [name], [name] Primary Care Jung Ingerson: [name] Other Clinician: Referring Shanah Guimaraes: Treating Hue Frick/Extender: [name] in Treatment: 298 Wound Status Wound Number: [id number] Primary Lymphedema Etiology: Wound Location: Right, Dorsal Foot Secondary Diabetic Wound/Ulcer of the Lower Extremity Wounding Event: Gradually Appeared Etiology: Date Acquired: 10/10/2021 Wound Open Weeks Of Treatment: 0 Status: Clustered Wound: No Comorbid Chronic sinus problems/congestion, Arrhythmia, Hypertension, History: Peripheral Arterial Disease, Type  II Diabetes, History of Burn, Gout, Confinement Anxiety Wound Measurements Length: (cm) 1.5 Width: (cm) 1 Depth: (cm) 0.1 Area: (cm) 1.178 Volume: (cm) 0.118 % Reduction in Area: 0% % Reduction in Volume: 0% Epithelialization: None Tunneling: No Undermining: No Wound Description Classification: Full Thickness Without Exposed Support Structures Wound Margin: Distinct, outline attached Exudate Amount: Medium Exudate Type: Serosanguineous Exudate Color: red, brown Foul Odor After Cleansing: No Slough/Fibrino Yes Wound Bed Granulation Amount: Medium (34-66%) Exposed Structure Granulation Quality: Red, Pink Fascia Exposed: No Necrotic Amount: Medium (34-66%) Fat Layer (Subcutaneous Tissue) Exposed: Yes Necrotic Quality:  Adherent Slough Tendon Exposed: No Muscle Exposed: No Joint Exposed: No Bone Exposed: No Treatment Notes Wound #209 (Foot) Wound Laterality: Dorsal, Right Cleanser Soap and Water Discharge Instruction: May shower and wash wound with dial antibacterial soap and water prior to dressing change. Wound Cleanser Discharge Instruction: Cleanse the wound with wound cleanser prior to applying a clean dressing using gauze sponges, not tissue or cotton balls. Peri-Wound Care Zinc Oxide Ointment 30g tube Discharge Instruction: Apply Zinc Oxide to periwound with each dressing change Sween Lotion (Moisturizing lotion) Discharge Instruction: Apply moisturizing lotion as directed Topical Primary Dressing KerraCel Ag Gelling Fiber Dressing, 4x5 in (silver alginate) Discharge Instruction: Apply silver alginate to wound bed as instructed Secondary Dressing ABD Pad, 8x10 Discharge Instruction: Apply over primary dressing as directed. Secured With Compression Wrap FourPress (4 layer compression wrap) Discharge Instruction: ****UNNA BOOT FIRST LAYER APPLIED TO UPPER PORTION OF LOWER LEG.*** Compression Stockings Add-Ons Electronic Signature(s) Signed: 10/10/2021  4:36:51 PM By: Deon Pilling RN, BSN Entered By: Deon Pilling on 10/10/2021 11:16:33 -------------------------------------------------------------------------------- Vitals Details Patient Name: Date of Service: [name], Asani J. 10/10/2021 10:30 A M Medical Record Number: [id number] Patient Account Number: [id number] Date of Birth/Sex: Treating RN: January 18, 1951 (70 y.o. [name] Primary Care Sovereign Ramiro: [name] Other Clinician: Referring Verlean Allport: Treating Cage Gupton/Extender: [name] in Treatment: 298 Vital Signs Time Taken: 10:35 Temperature (F): 98.2 Height (in): 70 Pulse (bpm): 74 Weight (lbs): 380.2 Respiratory Rate (breaths/min): 22 Body Mass Index (BMI): 54.5 Blood Pressure (mmHg): 138/66 Reference Range: 80 - 120 mg / dl Electronic Signature(s) Signed: 10/10/2021 4:36:51 PM By: Deon Pilling RN, BSN Entered By: Deon Pilling on 10/10/2021 10:43:48

## 2021-10-10 NOTE — Progress Notes (Signed)
OFFICE VISIT  10/10/2021  CC:  Chief Complaint  Patient presents with   Follow-up    [name]; pt is not fasting   HPI:    Patient is a 70 y.o. male who presents for f/u uncontrolled HTN, DM 2, HLD, CRI III. He has chronic a-fib and is on anticoagulation. A/P as of last visit 3 weeks ago: "#1 hypertension,difficult to control---in the setting of CRI III.  Not controlled well per home measurements but blood pressure normal here today.  Suspect there is some inaccuracy of home measurements but hard to tell.  We will hold with current treatment of amlodipine 10 mg daily, clonidine 0.3 3 times daily, diltiazem 120 mg a day, Imdur 30 mg/day, terazosin 10 mg a day, and valsartan 320 mg a day.  He continues on Lasix 60 mg a day for chronic lower extremity edema. Monitoring electrolytes and creatinine today.   #2 anxiety and depression.  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  Current social situation is the biggest factor playing a role.  Also his inability to lose weight weighs heavily on him. He declines antidepressant at this time but is open to counseling so I have made referral today. Will also reach out to see if there is any social services avenues that we can help with.   DM 2: Next a1c after 10/10/21"  INTERIM HX: [name] is still struggling a lot from a psychological/emotional standpoint. He had a court date and was able to get his eviction date moved back to early January. He did get an upsetting letter today stating he was going to be evicted in 2 days.  He has a call into his lawyer to see if this is accurate. His apartment is still not ready to move into.  He has established with a counselor and felt like this went okay. Home blood pressures consistently [id number] systolic over [id number] diastolic, heart rate [id number] to 70s.  Taking 80mg  lasix qd, clarified with patient today.    Past Medical History:  Diagnosis  Date   ALLERGIC RHINITIS 08/11/2006   ASTHMA 08/11/2006   Bradycardia    Beta blocker d/c'd 2022   Chronic atrial fibrillation ([place]) 08/2014   Chronic combined systolic and diastolic CHF (congestive heart failure) [facility])    Cardiology f/u 12/2017: pt volume overloaded (R heart dysf suspected), BNP very high, lasix increased.  Repeat echo 12/2017: normal LV EF, mild DD, +RV syst dysfxn, mod pulm HTN, biatrial enlgmt.   Chronic constipation    Chronic renal insufficiency, stage 3 (moderate) (HCC)    Colon cancer screening 02/2021   02/2021 Cologuard POS->GI ref   DIABETES MELLITUS, TYPE II 08/11/2006   HYPERTENSION 08/11/2006   Impaired mobility and endurance    MRSA infection 06/2018   LL venous stasis ulcer infected   Normocytic anemia 2016-2019   03/2018 B12 normal, iron ok (ferritin borderline low).   OBESITY, MORBID 12/14/2007   saxenda started 05/2020 by [facility] wt mgmt center   OSA (obstructive sleep apnea)    to get sleep study with [facility] as of 12/03/2018 consult.   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  92018 ID started him on amoxil prophylaxis---ineffective.  End 2018/Jan 2019 penicillin G injections prophyl helpful but pt declined to continue this as of 01/2018 ID f/u.  ID talked him into resuming monthly penicillin G as  of 02/2018 f/u.     Restless leg syndrome    Rx'd clonazepam 09/2017 and pt refused to take it after reading the medication's potential side effects.   Venous stasis ulcers of both lower extremities (HCC)    Severe lymphedema.  wound clinic care ongoing as of 01/2018    Past Surgical History:  Procedure Laterality Date   Carotid dopplers  07/23/2018   Left NORMAL.  Right 1-39% ICA stenosis, with <50% distal CCA stenosis (not hemodynamically significant)   TRANSTHORACIC ECHOCARDIOGRAM  11/2007; 09/2014; 11/2015;12/2017   LV fxn normal, EF normal, mild dilation of left atrium.   2015 grade II diast dysfxn.  2017 EF 55-60%. 2019: LVEF 60-65%, mild RV syst dysf,biatrial enlgmt, mod pulm htn.   URETERAL STENT PLACEMENT     virtual colonoscopy  01/2011   Normal    Outpatient Medications Prior to Visit  Medication Sig Dispense Refill   amLODipine (NORVASC) 10 MG tablet Take 1 tablet (10 mg total) by mouth daily. 90 tablet 3   arginine 500 MG tablet Take by mouth.     b complex vitamins capsule Take 1 capsule by mouth every morning.     butalbital-acetaminophen-caffeine (FIORICET WITH CODEINE) 50-325-40-30 MG capsule TAKE 1 TO 2 CAPSULES BY MOUTH EVERY 6 HOURS AS NEEDED FOR HEADACHES. NOT TO EXCEED 6 CAPSULES PER DAY 60 capsule 5   cetirizine (ZYRTEC) 10 MG tablet Take 10 mg by mouth daily.     CHROMIUM GTF PO Take by mouth daily.     cloNIDine (CATAPRES) 0.3 MG tablet Take 1 tablet (0.3 mg total) by mouth 3 (three) times daily. 90 tablet 3   Coenzyme Q10 (CO Q 10) 10 MG CAPS Take by mouth. Reported on 02/19/2016     diltiazem (TIAZAC) 120 MG 24 hr capsule Take 1 capsule (120 mg total) by mouth daily. 90 capsule 3   furosemide (LASIX) 40 MG tablet TAKE 2 TABLETS BY MOUTH  TWICE DAILY 360 tablet 1   Glutamine 500 MG CAPS Take by mouth.     HYDROcodone-acetaminophen (NORCO/VICODIN) 5-325 MG tablet 1-2 tabs po bid prn pain 60 tablet 0   Insulin Lispro Prot & Lispro (HUMALOG MIX 75/25 KWIKPEN) (75-25) 100 UNIT/ML Kwikpen INJECT SUBCUTANEOUSLY 25  UNITS EVERY MORNING AND 20  UNITS EVERY EVENING AT  SUPPER 45 mL 3   Insulin Pen Needle (B-D ULTRAFINE III SHORT PEN) 31G X 8 MM MISC USE 1 PENNEEDLE TWICE DAILY 200 each 1   metFORMIN (GLUCOPHAGE) 1000 MG tablet TAKE 1 TABLET BY MOUTH  TWICE DAILY WITH MEALS 180 tablet 1   Multiple Vitamin (MULTIVITAMIN) tablet Take 1 tablet by mouth daily.     ONETOUCH ULTRA test strip CHECK BLOOD SUGAR TWICE  DAILY 200 strip 3   rivaroxaban (XARELTO) 20 MG TABS tablet TAKE 1 TABLET BY MOUTH ONCE DAILY WITH SUPPER 90 tablet 3   terazosin (HYTRIN) 10  MG capsule TAKE 1 CAPSULE BY MOUTH  ONCE DAILY AT BEDTIME 90 capsule 3   valsartan (DIOVAN) 320 MG tablet Take 1 tablet (320 mg total) by mouth daily. 90 tablet 3   vitamin E 400 UNIT capsule Take 400 Units by mouth every morning. Selenium 50mg      isosorbide mononitrate (IMDUR) 30 MG 24 hr tablet Take 1 tablet (30 mg total) by mouth daily. 30 tablet 0   No facility-administered medications prior to visit.    Allergies  Allergen Reactions   Other Other (See Comments)    Sneezing, coughing  Hydralazine Other (See Comments)    Drowsiness/sedation/mental fog    ROS As per HPI  PE: Vitals with BMI 10/10/2021 09/19/2021 08/03/2021  Height 5\' 10"  5\' 10"  5\' 10"   Weight 392 lbs 3 oz 395 lbs 388 lbs 6 oz  BMI 56.27 [id number]  Systolic [id number] A999333 [id number]  Diastolic 56 60 72  Pulse 72 65 72   Gen: Alert, well appearing.  Patient is oriented to person, place, time, and situation. AFFECT: morose, tearful, lucid thought and speech. No further exam today.   LABS:  Lab Results  Component Value Date   TSH 6.21 (H) 09/19/2021   T3TOTAL 70 (L) 06/08/2018   Lab Results  Component Value Date   WBC 8.1 04/04/2021   HGB 13.4 04/04/2021   HCT 43.6 04/04/2021   MCV 84.8 04/04/2021   PLT 236 04/04/2021   Lab Results  Component Value Date   IRON 45 03/10/2018   FERRITIN 26.4 03/10/2018   Lab Results  Component Value Date   VITAMINB12 592 03/10/2018   Lab Results  Component Value Date   CREATININE 1.55 (H) 09/19/2021   BUN 25 (H) 09/19/2021   NA 137 09/19/2021   K 4.9 09/19/2021   CL 100 09/19/2021   CO2 28 09/19/2021   Lab Results  Component Value Date   ALT 11 04/04/2021   AST 15 04/04/2021   ALKPHOS 70 07/12/2020   BILITOT 1.0 04/04/2021   Lab Results  Component Value Date   CHOL 101 04/04/2021   Lab Results  Component Value Date   HDL 41 04/04/2021   Lab Results  Component Value Date   LDLCALC 46 04/04/2021   Lab Results  Component Value Date   TRIG 61  04/04/2021   Lab Results  Component Value Date   CHOLHDL 2.5 04/04/2021   Lab Results  Component Value Date   PSA 0.39 07/11/2021   PSA 0.35 07/12/2020   PSA 0.71 07/06/2019   Lab Results  Component Value Date   HGBA1C 7.9 (H) 07/11/2021    IMPRESSION AND PLAN:  #1 Adjustment disorder with depressed and anxious mood. He has declined antidepressant.  He has established with a Social worker.  We will have to see how his current social situation plays out.  Emotional support given today.  #2 hypertension.  Home measurements consistently high.  However at wound clinic earlier today his blood pressure was 138/66.  Today in office his blood pressure is 123/56. I recommended he continue current blood pressure medication regimen and he can cut back his home checks to as needed if feeling bad.  #3 chronic lymphedema bilateral lower legs. Wound clinic for regular changing of Unna boots. Lasix 80 mg daily. Checking electrolytes and creatinine today.  4.  Chronic renal insufficiency stage III.  With current med regimen are certainly keeping a close eye on this.  Electrolytes and creatinine today.  5. diabetes type 2, with nephropathy. Continue 75/25 insulin twice daily and metformin 1000 mg twice a day.  Last GFR was 45 but if it drops below this consistently we will have to get him off metformin.  6. preventative health care : colon ca screening: positive cologuard 02/2021-->referred to GI but pt has not been ready to schedule yet. Prostate cancer screening last PSA normal 3 months ago. Vaccines: Tdap ->deferred for now.  Shingrix->deferred for now.  An After Visit Summary was printed and given to the patient.  FOLLOW UP: Return in about 3 months (around 01/08/2022).  Signed:  Abbe Amsterdam  Trystian Crisanto, MD           10/10/2021

## 2021-10-11 ENCOUNTER — Telehealth: Payer: Self-pay | Admitting: Family Medicine

## 2021-10-11 ENCOUNTER — Telehealth (HOSPITAL_COMMUNITY): Payer: Self-pay | Admitting: Licensed Clinical Social Worker

## 2021-10-11 DIAGNOSIS — I10 Essential (primary) hypertension: Secondary | ICD-10-CM

## 2021-10-11 DIAGNOSIS — Z794 Long term (current) use of insulin: Secondary | ICD-10-CM

## 2021-10-11 DIAGNOSIS — N183 Chronic kidney disease, stage 3 unspecified: Secondary | ICD-10-CM

## 2021-10-11 DIAGNOSIS — E7800 Pure hypercholesterolemia, unspecified: Secondary | ICD-10-CM

## 2021-10-11 DIAGNOSIS — N1831 Chronic kidney disease, stage 3a: Secondary | ICD-10-CM

## 2021-10-11 NOTE — Telephone Encounter (Signed)
Spoke with pt and he faces eviction as of tomorrow morning. Provider verbally made aware. LVM for Lowry Ram with [place] social services to see what can be done to better assist pt.

## 2021-10-11 NOTE — Addendum Note (Signed)
Addended by: Emi Holes D on: 10/11/2021 04:40 PM   Modules accepted: Orders

## 2021-10-11 NOTE — Telephone Encounter (Signed)
LVM for pt to return call. Please advise of lab results as well

## 2021-10-11 NOTE — Telephone Encounter (Signed)
Pt left vm. He stated he needed to talk to [name] today to let know know something. - Sharia Reeve is out of office for the rest of the day and pt has a upcoming appt.

## 2021-10-11 NOTE — Telephone Encounter (Signed)
A user error has taken place: charting done on wrong patient and has been corrected.

## 2021-10-11 NOTE — Telephone Encounter (Signed)
Pt was advised of update

## 2021-10-11 NOTE — Telephone Encounter (Signed)
LVM for pt to return call

## 2021-10-11 NOTE — Telephone Encounter (Signed)
Okay given to place referral for community care coordination.

## 2021-10-12 ENCOUNTER — Other Ambulatory Visit: Payer: Medicare Other | Admitting: *Deleted

## 2021-10-12 ENCOUNTER — Other Ambulatory Visit: Payer: Self-pay

## 2021-10-12 ENCOUNTER — Telehealth: Payer: Self-pay | Admitting: *Deleted

## 2021-10-12 NOTE — Telephone Encounter (Signed)
   Telephone encounter was:  Unsuccessful.  10/12/2021 Name: [name] MRN: [id number] DOB: 07/31/1951  Unsuccessful outbound call made today to assist with:   Homeless  Outreach Attempt:  1st Attempt  A HIPAA compliant voice message was left requesting a return call.  Instructed patient to call back at   Instructed patient to call back at [phone]  at their earliest convenience. Yehuda Mao Greenauer -[facility] Guide , Embedded Care Coordination [facility], Care Management  [phone] [address] [place] , [place] [address] Email : [email or web address]

## 2021-10-12 NOTE — Patient Outreach (Signed)
  Care Management   Follow Up Note   10/12/2021 Name: [name] MRN: [id number] DOB: Sep 08, 1951   Referred by: [name], MD  Reason for referral : Telephone Assessment (Unsuccessful Initial Outreach Call Attempt.)  An unsuccessful telephone outreach was attempted today. The patient was referred to the case management team for assistance with care management and care coordination. A HIPAA compliant message was left on voicemail, providing contact information and a brief explanation regarding reason for the call, encouraging patient to return LCSW's call at his earliest convenience.  LCSW will make a second initial telephone outreach call attempt, within the next 5-7 business days, if a return call is not received from patient in the meantime.  Follow-Up Plan:  10/19/2021 at 9:30 am  Danford Bad, BSW, MSW, LCSW  Licensed Clinical Social Worker  [facility]  Mailing [place] [address], [place], [place] [address] Physical Address-[address], [place], [place] [address] Toll Free Main # [phone] Fax # [phone] Cell # [phone]  [email or web address]

## 2021-10-17 ENCOUNTER — Other Ambulatory Visit: Payer: Self-pay

## 2021-10-17 ENCOUNTER — Encounter (HOSPITAL_BASED_OUTPATIENT_CLINIC_OR_DEPARTMENT_OTHER): Payer: Medicare Other | Admitting: Physician Assistant

## 2021-10-17 DIAGNOSIS — E11622 Type 2 diabetes mellitus with other skin ulcer: Secondary | ICD-10-CM | POA: Diagnosis not present

## 2021-10-17 DIAGNOSIS — L97829 Non-pressure chronic ulcer of other part of left lower leg with unspecified severity: Secondary | ICD-10-CM | POA: Diagnosis not present

## 2021-10-17 DIAGNOSIS — L97522 Non-pressure chronic ulcer of other part of left foot with fat layer exposed: Secondary | ICD-10-CM | POA: Diagnosis not present

## 2021-10-17 DIAGNOSIS — I890 Lymphedema, not elsewhere classified: Secondary | ICD-10-CM | POA: Diagnosis not present

## 2021-10-17 DIAGNOSIS — L97322 Non-pressure chronic ulcer of left ankle with fat layer exposed: Secondary | ICD-10-CM | POA: Diagnosis not present

## 2021-10-17 DIAGNOSIS — L97521 Non-pressure chronic ulcer of other 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 of skin: Secondary | ICD-10-CM | POA: Diagnosis not present

## 2021-10-17 DIAGNOSIS — L97519 Non-pressure chronic ulcer of other part of right foot with unspecified severity: Secondary | ICD-10-CM | POA: Diagnosis not present

## 2021-10-17 DIAGNOSIS — L03116 Cellulitis of left lower limb: Secondary | ICD-10-CM | POA: Diagnosis not present

## 2021-10-17 DIAGNOSIS — I87333 Chronic venous hypertension (idiopathic) with ulcer and inflammation of bilateral lower extremity: Secondary | ICD-10-CM | POA: Diagnosis not present

## 2021-10-17 DIAGNOSIS — E1140 Type 2 diabetes mellitus with diabetic neuropathy, unspecified: Secondary | ICD-10-CM | POA: Diagnosis not present

## 2021-10-17 DIAGNOSIS — L03115 Cellulitis of right lower limb: Secondary | ICD-10-CM | POA: Diagnosis not present

## 2021-10-17 DIAGNOSIS — L97512 Non-pressure chronic ulcer of other part of right foot with fat layer exposed: Secondary | ICD-10-CM | POA: Diagnosis not present

## 2021-10-17 DIAGNOSIS — L97812 Non-pressure chronic ulcer of other part of right lower leg with fat layer exposed: Secondary | ICD-10-CM | POA: Diagnosis not present
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
## 2021-10-17 NOTE — Progress Notes (Signed)
[name], [name] ([id number]) Visit Report for 10/17/2021 Chief Complaint Document Details Patient Name: Date of Service: [name], [name] 10/17/2021 10:30 A M Medical Record Number: [id number] Patient Account Number: [id number] Date of Birth/Sex: Treating RN: 06-26-1951 (70 y.o. [name] Primary Care Provider: Nicoletta [name] Other Clinician: Referring Provider: Treating Provider/Extender: [name] in Treatment: 299 Information Obtained from: Patient Chief Complaint Patient is here for evaluation of venous/lymphedema ulcers Electronic Signature(s) Signed: 10/17/2021 12:48:21 PM By: Lenda Kelp PA-C Entered By: Lenda Kelp on 10/17/2021 12:48:21 -------------------------------------------------------------------------------- HPI Details Patient Name: Date of Service: [name], [name] 10/17/2021 10:30 A M Medical Record Number: [id number] Patient Account Number: [id number] Date of Birth/Sex: Treating RN: [birth year] (70 y.o. [name] Primary Care Provider: Nicoletta [name] Other Clinician: Referring Provider: Treating Provider/Extender: [name] in Treatment: 299 History of Present Illness HPI Description: Referred by PCP for consultation. Patient has long standing history of BLE venous stasis, no prior ulcerations. At beginning of month, developed cellulitis and weeping. Received IM Rocephin followed by Keflex and resolved. Wears compression stocking, appr 6 months old. Not sure strength. No present drainage. 01/22/16 this is a patient who is a type II diabetic on insulin. 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 He tells me he religiously wears pressure stockings of uncertain strength. He was here with weeping edema about 8 months ago but did not have an open wound. Roughly a month ago he had a reopening on his bilateral legs. He is been using bandages and Neosporin.  He does not complain of pain. 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 Last BUN/creatinine I have is from 11/20/15 at 13 and 1.0 respectively 01/29/16; patient arrives today having tolerated the Profore wrap. He brought in his stockings and these are 18 mmHg stockings he bought from [employer]. The compression here is likely inadequate. He does not complain of pain or excessive drainage she has no systemic symptoms. The wound on the right looks improved as does the one on the left although one on the left is more substantial with still tissue at risk below the actual wound area on the bilateral posterior calf 02/05/16; patient arrives with poor edema control. He states that we did put a 4 layer compression on it last week. No weight appear 5 this. 02/12/16; the area on the posterior right Has healed. The left Has a substantial wound that has necrotic surface eschar that requires a debridement with a curette. 02/16/16;the patient called or a Nurse visit secondary to increased swelling. He had been in earlier in the week with his right leg healed. He was transitioned to is on pressure stocking on the right leg with the only open wound on the left, a substantial area on the left posterior calf. Note he has a history of severe lower extremity edema, he has a history of chronic atrial fibrillation but not heart failure per my notes but I'll need to research this. He is not complaining of chest pain shortness of breath or orthopnea. The intake nurse noted blisters on the previously closed right leg 02/19/16; this is the patient's regular visit day. I see him on Friday with escalating edema new wounds on the right leg and clear signs of at least right ventricular heart failure. I increased his Lasix to 40 twice a  day. He is returning currently in follow-up. States he is noticed a decrease in that the  edema 02/26/16 patient's legs have much less edema. There is nothing really open on the right leg. The left leg has improved condition of the large superficial wound on the posterior left leg 03/04/16; edema control is very much better. The patient's right leg wounds have healed. On the left leg he continues to have severe venous inflammation on the posterior aspect of the left leg. There is no tenderness and I don't think any of this is cellulitis. 03/11/16; patient's right leg is married healed and he is in his own stocking. The patient's left leg has deteriorated somewhat. There is a lot of erythema around the wound on the posterior left leg. There is also a significant rim of erythema posteriorly just above where the wrap would've ended there is a new wound in this location and a lot of tenderness. Can't rule out cellulitis in this area. 03/15/16; patient's right leg remains healed and he is in his own stocking. The patient's left leg is much better than last review. His major wound on the posterior aspect of his left Is almost fully epithelialized. He has 3 small injuries from the wraps. Really. Erythema seems a lot better on antibiotics 03/18/16; right leg remains healed and he is in his own stocking. The patient's left leg is much better. The area on the posterior aspect of the left calf is fully epithelialized. His 3 small injuries which were wrap injuries on the left are improved only one seems still open his erythema has resolved 03/25/16; patient's right leg remains healed and he is in his own stocking. There is no open area today on the left leg posterior leg is completely closed up. His wrap injuries at the superior aspect of his leg are also resolved. He looks as though he has some irritation on the dorsal ankle but this is fully epithelialized without evidence of infection. 03/28/16; we discharged this patient on Monday. Transitioned him into his own stocking. There were problems almost  immediately with uncontrolled swelling weeping edema multiple some of which have opened. He does not feel systemically unwell in particular no chest pain no shortness of breath and he does not feel 04/08/16; the edema is under better control with the Profore light wrap but he still has pitting edema. There is one large wound anteriorly 2 on the medial aspect of his left leg and 3 small areas on the superior posterior calf. Drainage is not excessive he is tolerating a Profore light well 04/15/16; put a Profore wrap on him last week. This is controlled is edema however he had a lot of pain on his left anterior foot most of his wounds are healed 04/22/16 once again the patient has denuded areas on the left anterior foot which he states are because his wrap slips up word. He saw his primary physician today is on Lasix 40 twice a day and states that he his weight is down 20 pounds over the last 3 months. 04/29/16: Much improved. left anterior foot much improved. He is now on Lasix 80 mg per day. Much improved edema control 05/06/16; I was hoping to be able to discharge him today however once again he has blisters at a low level of where the compression was placed last week mostly on his left lateral but also his left medial leg and a small area on the anterior part of the left foot. 05/09/16; apparently the patient  went home after his appointment on 7/4 later in the evening developing pain in his upper medial thigh together with subjective fever and chills although his temperature was not taken. The pain was so intense he felt he would probably have to call 911. However he then remembered that he had leftover doxycycline from a previous round of antibiotics and took these. By the next morning he felt a lot better. He called and spoke to one of our nurses and I approved doxycycline over the phone thinking that this was in relation to the wounds we had previously seen although they were definitely were not. The  patient feels a lot better old fever no chills he is still working. Blood sugars are reasonably controlled 05/13/16; patient is back in for review of his cellulitis on his anterior medial upper thigh. He is taking doxycycline this is a lot better. Culture I did of the nodular area on the dorsal aspect of his foot grew MRSA this also looks a lot better. 05/20/16; the patient is cellulitis on the medial upper thigh has resolved. All of his wound areas including the left anterior foot, areas on the medial aspect of the left calf and the lateral aspect of the calf at all resolved. He has a new blister on the left dorsal foot at the level of the fourth toe this was excised. No evidence of infection 05/27/16; patient continues to complain weeping edema. He has new blisterlike wounds on the left anterior lateral and posterior lateral calf at the top of his wrap levels. The area on his left anterior foot appears better. He is not complaining of fever, pain or pruritus in his feet. 05/30/16; the patient's blisters on his left anterior leg posterior calf all look improved. He did not increase the Lasix 100 mg as I suggested because he was going to run out of his 40 mg tablets. He is still having weeping edema of his toes 06/03/16; I renewed his Lasix at 80 mg once a day as he was about to run out when I last saw him. He is on 80 mg of Lasix now. I have asked him to cut down on the excessive amount of water he was drinking and asked him to drink according to his thirst mechanisms 06/12/2016 -- was seen 2 days ago and was supposed to wear his compression stockings at home but he is developed lymphedema and superficial blisters on the left lower extremity and hence came in for a review 06/24/16; the remaining wound is on his left anterior leg. He still has edema coming from between his toes. There is lymphedema here however his edema is generally better than when I last saw this. He has a history of atrial fibrillation  but does not have a known history of congestive heart failure nevertheless I think he probably has this at least on a diastolic basis. 07/01/16 I reviewed his echocardiogram from January 2017. This was essentially normal. He did not have LVH, EF of 55-60%. His right ventricular function was normal although he did have trivial tricuspid and pulmonic regurgitation. This is not audible on exam however. I increased his Lasix to do massive edema in his legs well above his knees I think in early July. He was also drinking an excessive amount of water at the time. 07/15/16; missed his appointment last week because of the Labor Day holiday on Monday. He could not get another appointment later in the week. Started to feel the wrap digging in superiorly  so we remove the top half and the bottom half of his wrap. He has extensive erythema and blistering superiorly in the left leg. Very tender. Very swollen. Edema in his foot with leaking edema fluid. He has not been systemically unwell 07/22/16; the area on the left leg laterally required some debridement. The medial wounds look more stable. His wrap injury wounds appear to have healed. Edema and his foot is better, weeping edema is also better. He tells me he is meeting with the supplier of the external compression pumps at work 08/05/16; the patient was on vacation last week in [facility]. His wrap is been on for an extended period of time. Also over the weekend he developed an extensive area of tender erythema across his anterior medial thigh. He took to doxycycline yesterday that he had leftover from a previous prescription. The patient complains of weeping edema coming out of his toes 08/08/16; I saw this patient on 10/2. He was tender across his anterior thigh. I put him on doxycycline. He returns today in follow-up. He does not have any open wounds on his lower leg, he still has edema weeping into his toes. 08/12/16; patient was seen back urgently today to  follow-up for his extensive left thigh cellulitis/erysipelas. He comes back with a lot less swelling and erythema pain is much better. I believe I gave him Augmentin and Cipro. His wrap was cut down as he stated a roll down his legs. He developed blistering above the level of the wrap that remained. He has 2 open blisters and 1 intact. 08/19/16; patient is been doing his primary doctor who is increased his Lasix from 40-80 once a day or 80 already has less edema. Cellulitis has remained improved in the left thigh. 2 open areas on the posterior left calf 08/26/16; he returns today having new open blisters on the anterior part of his left leg. He has his compression pumps but is not yet been shown how to use some vital representative from the supplier. 09/02/16 patient returns today with no open wounds on the left leg. Some maceration in his plantar toes 09/10/2016 -- Dr. Leanord Hawking had recently discharged him on 09/02/2016 and he has come right back with redness swelling and some open ulcers on his left lower extremity. He says this was caused by trying to apply his compression stockings and he's been unable to use this and has not been able to use his lymphedema pumps. He had some doxycycline leftover and he has started on this a few days ago. 09/16/16; there are no open wounds on his leg on the left and no evidence of cellulitis. 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 He does not complain of symptoms here. I am not clear use using his external compression pumps. 09/23/16; I have not seen this patient in 2 weeks. He canceled his appointment 10 days ago as he was going on vacation. He tells me that on Monday he noticed a large area on his posterior left leg which is been draining copiously and is reopened into a large wound. He is been using ABDs and the external part of his juxtalite, according to our nurse this was not on properly. 10/07/16;  Still a substantial area on the posterior left leg. Using silver alginate 10/14/16; in general better although there is still open area which looks healthy. Still using silver alginate. He reminds me that this happen before he left for [facility]  [place]. T oday while he was showering in the morning. He had been using his juxtalite's 10/21/16; the area on his posterior left leg is fully epithelialized. However he arrives today with a large area of tender erythema in his medial and posterior left thigh just above the knee. I have marked the area. Once again he is reluctant to consider hospitalization. I treated him with oral antibiotics in the past for a similar situation with resolution I think with doxycycline however this area it seems more extensive to me. He is not complaining of fever but does have chills and says states he is thirsty. His blood sugar today was in the 140s at home 10/25/16 the area on his posterior left leg is fully epithelialized although there is still some weeping edema. The large area of tenderness and erythema in his medial and posterior left thigh is a lot less tender although there is still a lot of swelling in this thigh. He states he feels a lot better. He is on doxycycline and Augmentin that I started last week. This will continued until Tuesday, December 26. I have ordered a duplex ultrasound of the left thigh rule out DVT whether there is an abscess something that would need to be drained I would also like to know. 11/01/16; he still has weeping edema from a not fully epithelialized area on his left posterior calf. Most of the rest of this looks a lot better. He has completed his antibiotics. His thigh is a lot better. Duplex ultrasound did not show a DVT in the thigh 11/08/16; he comes in today with more Denuded surface epithelium from the posterior aspect of his calf. There is no real evidence of cellulitis. The superior aspect of his wrap appears to have put quite an  indentation in his leg just below the knee and this may have contributed. He does not complain of pain or fever. We have been using silver alginate as the primary dressing. The area of cellulitis in the right thigh has totally resolved. He has been using his compression stockings once a week 11/15/16; the patient arrives today with more loss of epithelium from the posterior aspect of his left calf. He now has a fairly substantial wound in this area. The reason behind this deterioration isn't exactly clear although his edema is not well controlled. He states he feels he is generally more swollen systemically. He is not complaining of chest pain shortness of breath fever. T me he has an appointment with his primary physician in early February. He is on 80 mg of oral ells Lasix a day. He claims compliance with the external compression pumps. He is not having any pain in his legs similar to what he has with his recurrent cellulitis 11/22/16; the patient arrives a follow-up of his large area on his left lateral calf. This looks somewhat better today. He came in earlier in the week for a dressing change since I saw him a week ago. He is not complaining of any pain no shortness of breath no chest pain 11/28/16; the patient arrives for follow-up of his large area on the left lateral calf this does not look better. In fact it is larger weeping edema. The surface of the wound does not look too bad. We have been using silver alginate although I'm not certain that this is a dressing issue. 12/05/16; again the patient follows up for a large wound on the left lateral and left posterior calf this does not look better. There  continues to be weeping edema necrotic surface tissue. More worrisome than this once again there is erythema below the wound involving the distal Achilles and heel suggestive of cellulitis. He is on his feet working most of the day of this is not going well. We are changing his dressing twice a week to  facilitate the drainage. 12/12/16; not much change in the overall dimensions of the large area on the left posterior calf. This is very inflamed looking. I gave him an. Doxycycline last week does not really seem to have helped. He found the wrap very painful indeed it seems to of dog into his legs superiorly and perhaps around the heel. He came in early today because the drainage had soaked through his dressings. 12/19/16- patient arrives for follow-up evaluation of his left lower extremity ulcers. He states that he is using his lymphedema pumps once daily when there is "no drainage". He admits to not using his lipedema pumps while under current treatment. His blood sugars have been consistently between 150-200. 12/26/16; the patient is not using his compression pumps at home because of the wetness on his feet. I've advised him that I think it's important for him to use this daily. He finds his feet too wet, he can put a plastic bag over his legs while he is in the pumps. Otherwise I think will be in a vicious circle. We are using silver alginate to the major area on his left posterior calf 01/02/17; the patient's posterior left leg has further of all into 3 open wounds. All of them covered with a necrotic surface. He claims to be using his compression pumps once a day. His edema control is marginal. Continue with silver alginate 01/10/17; the patient's left posterior leg actually looks somewhat better. There is less edema, less erythema. Still has 3 open areas covered with a necrotic surface requiring debridement. He claims to be using his compression pumps once a day his edema control is better 01/17/17; the patient's left posterior calf look better last week when I saw him and his wrap was changed 2 days ago. He has noted increasing pain in the left heel and arrives today with much larger wounds extensive erythema extending down into the entire heel area especially tender medially. He is not  systemically unwell CBGs have been controlled no fever. Our intake nurse showed me limegreen drainage on his AVD pads. 01/24/17; his usual this patient responds nicely to antibiotics last week giving him Levaquin for presumed Pseudomonas. The whole entire posterior part of his leg is much better much less inflamed and in the case of his Achilles heel area much less tender. He has also had some epithelialization posteriorly there are still open areas here and still draining but overall considerably better 01/31/17- He has continue to tolerate the compression wraps. he states that he continues to use the lymphedema pumps daily, and can increase to twice daily on the weekends. He is voicing no complaints or concerns regarding his LLE ulcers 02/07/17-he is here for follow-up evaluation. He states that he noted some erythema to the left medial and anterior thigh, which he states is new as of yesterday. He is concerned about recurrent cellulitis. He states his blood sugars have been slightly elevated, this morning in the 180s 02/14/17; he is here for follow-up evaluation. When he was last here there was erythema superiorly from his posterior wound in his anterior thigh. He was prescribed Levaquin however a culture of the wound surface grew MRSA over  the phone I changed him to doxycycline on Monday and things seem to be a lot better. 02/24/17; patient missed his appointment on Friday therefore we changed his nurse visit into a physician visit today. Still using silver alginate on the large area of the posterior left thigh. He isn't new area on the dorsal left second toe 03/03/17; actually better today although he admits he has not used his external compression pumps in the last 2 days or so because of work responsibilities over the weekend. 03/10/17; continued improvement. External compression pumps once a day almost all of his wounds have closed on the posterior left calf. Better edema control 03/17/17; in general  improved. He still has 3 small open areas on the lateral aspect of his left leg however most of the area on the posterior part of his leg is epithelialized. He has better edema control. He has an ABD pad under his stocking on the right anterior lower leg although he did not let us look at that today. 03/24/17; patient arrives back in clinic today with no open areas however there are areas on the posterior left calf and anterior left calf that are less than 100% epithelialized. His edema is well controlled in the left lower leg. There is some pitting edema probably lymphedema in the left upper thigh. He uses compression pumps at home once per day. I tried to get him to do this twice a day although he is very reticent. 04/01/2017 -- for the last 2 days he's had significant redness, tenderness and weeping and came in for an urgent visit today. 04/07/17; patient still has 6 more days of doxycycline. He was seen by Dr. Meyer Russel last Wednesday for cellulitis involving the posterior aspect, lateral aspect of his Involving his heel. For the most part he is better there is less erythema and less weeping. He has been on his feet for 12 hours 2 over the weekend. Using his compression pumps once a day 04/14/17 arrives today with continued improvement. Only one area on the posterior left calf that is not fully epithelialized. He has intense bilateral venous inflammation associated with his chronic venous insufficiency disease and secondary lymphedema. We have been using silver alginate to the left posterior calf wound In passing he tells [place] today that the right leg but we have not seen in quite some time has an open area on it but he doesn't want [place] to look at this today states he will show this to [place] next week. 04/21/17; there is no open area on his left leg although he still reports some weeping edema. He showed [place] his right leg today which is the first time we've seen this leg in a long time. He has a large area of  open wound on the right leg anteriorly healthy granulation. Quite a bit of swelling in the right leg and some degree of venous inflammation. He told us about the right leg in passing last week but states that deterioration in the right leg really only happened over the weekend 04/28/17; there is no open area on the left leg although there is an irritated part on the posterior which is like a wrap injury. The wound on the right leg which was new from last week at least to [place] is a lot better. 05/05/17; still no open area on the left leg. Patient is using his new compression stocking which seems to be doing a good job of controlling the edema. He states he is using his  compression pumps once per day. The right leg still has an open wound although it is better in terms of surface area. Required debridement. A lot of pain in the posterior right Achilles marked tenderness. Usually this type of presentation this patient gives concern for an active cellulitis 05/12/17; patient arrives today with his major wound from last week on the right lateral leg somewhat better. Still requiring debridement. He was using his compression stocking on the left leg however that is reopened with superficial wounds anteriorly he did not have an open wound on this leg previously. He is still using his juxta light's once daily at night. He cannot find the time to do this in the morning as he has to be at work by 7 AM 05/19/17; right lateral leg wound looks improved. No debridement required. The concerning area is on the left posterior leg which appears to almost have a subcutaneous hemorrhagic component to it. We've been using silver alginate to all the wounds 05/26/17; the right lateral leg wound continues to look improved. However the area on the left posterior calf is a tightly adherent surface. [place] using silver alginate. Because of the weeping edema in his legs there is very little good alternatives. 06/02/17; the patient left  here last week looking quite good. Major wound on the left posterior calf and a small one on the right lateral calf. Both of these look satisfactory. He tells me that by Wednesday he had noted increased pain in the left leg and drainage. He called on Thursday and Friday to get an appointment here but we were blocked. He did not go to urgent care or his primary physician. He thinks he had a fever on Thursday but did not actually take his temperature. He has not been using his compression pumps on the left leg because of pain. I advised him to go to the emergency room today for IV antibiotics for stents of left leg cellulitis but he has refused I have asked him to take 2 days off work to keep his leg elevated and he has refused this as well. In view of this I'm going to call him and Augmentin and doxycycline. 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 However after a detailed discussion regarding the risk benefits and alternatives he refuses to get admitted to the hospital. With no other recourse I will continue him on oral antibiotics as before and hopefully he'll have his infectious disease consultation this week. 06/16/2017 -- the patient was seen today by the nurse practitioner at infectious disease Ms. Dixon. 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 He has also been advise Lotrimin cream locally. she also had wise IV antibiotics for his condition if it flares up 06/23/17; patient arrives today with drainage bilaterally although the remaining wound on the left posterior calf after  cleaning up today "highlighter yellow drainage" did not look too bad. Unfortunately he has had breakdown on the right anterior leg [previously this leg had not been open and he is using a black stocking] he went to see infectious  disease and is been put on clindamycin 150 mg daily, I did not verify the dose although I'm not familiar with using clindamycin in this dosing range, perhaps for prophylaxisoo 06/27/17; I brought this patient back today to follow-up on the wound deterioration on the right lower leg together with surrounding cellulitis. I started him on doxycycline 4 days ago. This area looks better however he comes in today with intense cellulitis on the medial part of his left thigh. This is not have a wound in this area. Extremely tender. We've been using silver alginate to the wounds on the right lower leg left lower leg with bilateral 4 layer compression he is using his external compression pumps once a day 07/04/17; patient's left medial thigh cellulitis looks better. He has not been using his compression pumps as his insert said it was contraindicated with cellulitis. His right leg continues to make improvements all the wounds are still open. We only have one remaining wound on the left posterior calf. Using silver alginate to all open areas. He is on doxycycline which I started a week ago and should be finishing I gave him Augmentin after Thursday's visit for the severe cellulitis on the left medial thigh which fortunately looks better 07/14/17; the patient's left medial thigh cellulitis has resolved. The cellulitis in his right lower calf on the right also looks better. All of his wounds are stable to improved we've been using silver alginate he has completed the antibiotics I have given him. He has clindamycin 150 mg once a day prescribed by infectious disease for prophylaxis, I've advised him to start this now. We have been using bilateral Unna boots over silver alginate to the wound  areas 07/21/17; the patient is been to see infectious disease who noted his recurrent problems with cellulitis. He was not able to tolerate prophylactic clindamycin therefore he is on amoxicillin 500 twice a day. He also had a second daily dose of Lasix added By Dr. Oneta Rack but he is not taking this. Nor is he being completely compliant with his compression pumps a especially not this week. He has 2 remaining wounds one on the right posterior lateral lower leg and one on the left posterior medial lower leg. 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 The patient has Unna boots bilaterally. 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 He has bilateral Unna boots for compression and silver alginate to his wounds. Arrives today with his legs looking as good as I have seen him in quite some time. Not surprisingly his wounds look better as well with improvement on the right lateral leg venous insufficiency wound and also the left medial leg. He is still using the compression pumps once a day 08/11/17; both legs appear to be doing better wounds on the right lateral and left medial legs look better. Skin on the right leg quite good. He is been using silver alginate as the primary dressing. I'm going to use Anasept gel calcium alginate and maintain all the secondary dressings 08/18/17; the patient continues to actually do quite well. The area on his right lateral leg is just about closed the left medial also looks better although it is still moist in this area. His edema is well controlled we have been using Anasept gel with calcium alginate  and the usual secondary dressings, 4 layer compression and once daily use of his compression pumps "always been able to manage 09/01/17; the patient  continues to do reasonably well in spite of his trip to [place]. The area on the right lateral leg is epithelialized. Left is much better but still open. He has more edema and more chronic erythema on the left leg [venous inflammation] 09/08/17; he arrives today with no open wound on the right lateral leg and decently controlled edema. Unfortunately his left leg is not nearly as in his good situation as last week.he apparently had increasing edema starting on Saturday. He edema soaked through into his foot so used a plastic bag to walk around his home. The area on the medial right leg which was his open area is about the same however he has lost surface epithelium on the left lateral which is new and he has significant pain in the Achilles area of the left foot. He is already on amoxicillin chronically for prophylaxis of cellulitis in the left leg 09/15/17; he is completed a week of doxycycline and the cellulitis in the left posterior leg and Achilles area is as usual improved. He still has a lot of edema and fluid soaking through his dressings. There is no open wound on the right leg. He saw infectious disease NP today 09/22/17;As usual 1 we transition him from our compression wraps to his stockings things did not go well. He has several small open areas on the right leg. He states this was caused by the compression wrap on his skin although he did not wear this with the stockings over them. He has several superficial areas on the left leg medially laterally posteriorly. He does not have any evidence of active cellulitis especially involving the left Achilles The patient is traveling from [facility] Saturday going to [facility]. He states he isn't attempting to get an appointment with a heel objects wound center there to change his dressings. I am not completely certain whether this will work 10/06/17; the patient came in on Friday for a nurse visit and the nurse reported that his legs actually look  quite good. He arrives in clinic today for his regular follow-up visit. He has a new wound on his left third toe over the PIP probably caused by friction with his footwear. He has small areas on the left leg and a very superficial but epithelialized area on the right anterior lateral lower leg. Other than that his legs look as good as I've seen him in quite some time. We have been using silver alginate Review of systems; no chest pain no shortness of breath other than this a 10 point review of systems negative 10/20/17; seen by Dr. Meyer Russel last week. He had taken some antibiotics [doxycycline] that he had left over. Dr. Meyer Russel thought he had candida infection and declined to give him further antibiotics. He has a small wound remaining on the right lateral leg several areas on the left leg including a larger area on the left posterior several left medial and anterior and a small wound on the left lateral. The area on the left dorsal third toe looks a lot better. ROS; Gen.; no fever, respiratory no cough no sputum Cardiac no chest pain other than this 10 point review of system is negative 10/30/17; patient arrives today having fallen in the bathtub 3 days ago. It took him a while to get up. He has pain and maceration in the wounds on his  left leg which have deteriorated. He has not been using his pumps he also has some maceration on the right lateral leg. 11/03/17; patient continues to have weeping edema especially in the left leg. This saturates his dressings which were just put on on 12/27. As usual the doxycycline seems to take care of the cellulitis on his lower leg. He is not complaining of fever, chills, or other systemic symptoms. He states his leg feels a lot better on the doxycycline I gave him empirically. He also apparently gets injections at his primary doctor's officeo Rocephin for cellulitis prophylaxis. I didn't ask him about his compression pump compliance today I think that's probably  marginal. Arrives in the clinic with all of his dressings primary and secondary macerated full of fluid and he has bilateral edema 11/10/17; the patient's right leg looks some better although there is still a cluster of wounds on the right lateral. The left leg is inflamed with almost circumferential skin loss medially to laterally although we are still maintaining anteriorly. He does not have overt cellulitis there is a lot of drainage. He is not using compression pumps. We have been using silver alginate to the wound areas, there are not a lot of options here 11/17/17; the patient's right leg continues to be stable although there is still open wounds, better than last week. The inflammation in the left leg is better. Still loss of surface layer epithelium especially posteriorly. There is no overt cellulitis in the amount of edema and his left leg is really quite good, tells me he is using his compression pumps once a day. 11/24/17; patient's right leg has a small superficial wound laterally this continues to improve. The inflammation in the left leg is still improving however we have continuous surface layer epithelial loss posteriorly. There is no overt cellulitis in the amount of edema in both legs is really quite good. He states he is using his compression pumps on the left leg once a day for 5 out of 7 days 12/01/17; very small superficial areas on the right lateral leg continue to improve. Edema control in both legs is better today. He has continued loss of surface epithelialization and left posterior calf although I think this is better. We have been using silver alginate with large number of absorptive secondary dressings 4 layer on the left Unna boot on the right at his request. He tells me he is using his compression pumps once a day 12/08/17; he has no open area on the right leg is edema control is good here. On the left leg however he has marked erythema and tenderness breakdown of skin. He has  what appears to be a wrap injury just distal to the popliteal fossa. 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 There was swelling erythema and breakdown in the right leg. He has much worse skin breakdown in the left leg as well multiple open areas medially and posteriorly as well as laterally. He tells me he has been using his compression pumps but tells me he feels that the drainage out of his leg is worse when he uses a compression pumps. T be fair to him he is been saying this o for a while however I  don't know that I have really been listening to this. I wonder if the compression pumps are working properly 12/22/17;. Once again he arrives with severe erythema, weeping edema from the left greater than right leg. Noncompliance with compression pumps. New this visit he is complaining of pain on the lateral aspect of the right leg and the medial aspect of his right thigh. He apparently saw his cardiologist Dr. [name] who was ordered an echocardiogram area and I think this is a step in the right direction 12/25/17; started his doxycycline Monday night. There is still intense erythema of the right leg especially in the anterior thigh although there is less tenderness. The erythema around the wound on the right lateral calf also is less tender. He still complaining of pain in the left heel. His wounds are about the same right lateral left medial left lateral. Superficial but certainly not close to closure. He denies being systemically unwell no fever chills no abdominal pain no diarrhea 12/29/17; back in follow-up of his extensive right calf and right thigh cellulitis. I added amoxicillin to cover possible doxycycline resistant  strep. This seems to of done the trick he is in much less pain there is much less erythema and swelling. He has his echocardiogram at 11:00 this morning. X-ray of the left heel was also negative. 01/05/18; the patient arrived with his edema under much better control. Now that he is retired he is able to use his compression pumps daily and sometimes twice a day per the patient. He has a wound on the right leg the lateral wound looks better. Area on the left leg also looks a lot better. He has no evidence of cellulitis in his bilateral thighs I had a quick peak at his echocardiogram. He is in normal ejection fraction and normal left ventricular function. He has moderate pulmonary hypertension moderately reduced right ventricular function. One would have to wonder about chronic sleep apnea although he says he doesn't snore. He'll review the echocardiogram with his cardiologist. 01/12/18; the patient arrives with the edema in both legs under exemplary control. He is using his compression pumps daily and sometimes twice daily. His wound on the right lateral leg is just about closed. He still has some weeping areas on the posterior left calf and lateral left calf although everything is just about closed here as well. I have spoken with Aldean Baker who is the patient's nurse practitioner and infectious disease. She was concerned that the patient had not understood that the parenteral penicillin injections he was receiving for cellulitis prophylaxis was actually benefiting him. I don't think the patient actually saw that I would tend to agree we were certainly dealing with less infections although he had a serious one last month. 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 He is healed. He'll be placed in juxtalite compression wraps and increase his lymphedema pumps to twice daily. We will follow up again next week to ensure there are no issues with the new  regiment. 01/20/18-he is here for evaluation of bilateral lower extremity weeping edema. Yesterday he was placed in compression wrap to the right lower extremity and compression stocking to left lower shrubbery. He states he uses lymphedema pumps last night and again this morning and noted a blister to the left lower extremity. On exam he was noted to have drainage to the right lower extremity. He will be placed in Unna boots bilaterally and follow-up next week 01/26/18; patient was actually discharged a  week ago to his own juxta light stockings only to return the next day with bilateral lower extremity weeping edema.he was placed in bilateral Unna boots. He arrives today with pain in the back of his left leg. There is no open area on the right leg however there is a linear/wrap injury on the left leg and weeping edema on the left leg posteriorly. I spoke with infectious disease about 10 days ago. They were disappointed that the patient elected to discontinue prophylactic intramuscular penicillin shots as they felt it was particularly beneficial in reducing the frequency of his cellulitis. I discussed this with the patient today. He does not share this view. He'll definitely need antibiotics today. Finally he is traveling to [place] and trauma leaving this Saturday and returning a week later and he does not travel with his pumps. He is going by car 01/30/18; patient was seen 4 days ago and brought back in today for review of cellulitis in the left leg posteriorly. I put him on amoxicillin this really hasn't helped as much as I might like. He is also worried because he is traveling to [facility] trauma by car. Finally we will be rewrapping him. There is no open area on the right leg over his left leg has multiple weeping areas as usual 02/09/18; The same wrap on for 10 days. He did not pick up the last doxycycline I prescribed for him. He apparently took 4 days worth he already had. There is nothing open on  his right leg and the edema control is really quite good. He's had damage in the left leg medially and laterally especially probably related to the prolonged use of Unna boots 02/12/18; the patient arrived in clinic today for a nurse visit/wrap change. He complained of a lot of pain in the left posterior calf. He is taking doxycycline that I previously prescribed for him. Unfortunately even though he used his stockings and apparently used to compression pumps twice a day he has weeping edema coming out of the lateral part of his right leg. This is coming from the lower anterior lateral skin area. 02/16/18; the patient has finished his doxycycline and will finish the amoxicillin 2 days. The area of cellulitis in the left calf posteriorly has resolved. He is no longer having any pain. He tells me he is using his compression pumps at least once a day sometimes twice. 02/23/18; the patient finished his doxycycline and Amoxil last week. On Friday he noticed a small erythematous circle about the size of a quarter on the left lower leg just above his ankle. This rapidly expanded and he now has erythema on the lateral and posterior part of the thigh. This is bright red. Also has an area on the dorsal foot just above his toes and a tender area just below the left popliteal fossa. He came off his prophylactic penicillin injections at his own insistence one or 2 months ago. This is obviously deteriorated since then 03/02/18; patient is on doxycycline and Amoxil. Culture I did last week of the weeping area on the back of his left calf grew group B strep. I have therefore renewed the amoxicillin 500 3 times a day for a further week. He has not been systemically unwell. Still complaining of an area of discomfort right under his left popliteal fossa. There is no open wound on the right leg. He tells me that he is using his pumps twice a day on most days 03/09/18; patient arrives in clinic today completing  his amoxicillin  today. The cellulitis on his left leg is better. Furthermore he tells me that he had intramuscular penicillin shots that his primary care office today. However he also states that the wrap on his right leg fell down shortly after leaving clinic last week. He developed a large blister that was present when he came in for a nurse visit later in the week and then he developed intense discomfort around this area.He tells me he is using his compression pumps 03/16/18; the patient has completed his doxycycline. The infectious part of this/cellulitis in the left heel area left popliteal area is a lot better. He has 2 open areas on the right calf. Still areas on the left calf but this is a lot better as well. 03/24/18; the patient arrives complaining of pain in the left popliteal area again. He thinks some of this is wrap injury. He has no open area on the right leg and really no open area on the left calf either except for the popliteal area. He claims to be compliant with the compression pumps 03/31/18; I gave him doxycycline last week because of cellulitis in the left popliteal area. This is a lot better although the surface epithelium is denuded off and response to this. He arrives today with uncontrolled edema in the right calf area as well as a fingernail injury in the right lateral calf. There is only a few open areas on the left 04/06/18; I gave him amoxicillin doxycycline over the last 2 weeks that the amoxicillin should be completing currently. He is not complaining of any pain or systemic symptoms. The only open areas see has is on the right lateral lower leg paradoxically I cannot see anything on the left lower leg. He tells me he is using his compression pumps twice a day on most days. Silver alginate to the wounds that are open under 4 layer compression 04/13/18; he completed antibiotics and has no new complaints. Using his compression pumps. Silver alginate that anything that's opened 04/20/18; he is  using his compression pumps religiously. Silver alginate 4 layer compression anything that's opened. He comes in today with no open wounds on the left leg but 3 on the right including a new one posteriorly. He has 2 on the right lateral and one on the right posterior. He likes Unna boots on the right leg for reasons that aren't really clear we had the usual 4 layer compression on the left. It may be necessary to move to the 4 layer compression on the right however for now I left them in the Unna boots 04/27/18; he is using his compression pumps at least once a day. He has still the wounds on the right lateral calf. The area right posteriorly has closed. He does not have an open wound on the left under 4 layer compression however on the dorsal left foot just proximal to the toes and the left third toe 2 small open areas were identified 05/11/18; he has not uses compression pumps. The areas on the right lateral calf have coalesced into one large wound necrotic surface. On the left side he has one small wound anteriorly however the edema is now weeping out of a large part of his left leg. He says he wasn't using his pumps because of the weeping fluid. I explained to him that this is the time he needs to pump more 05/18/18; patient states he is using his compression pumps twice a day. The area on the right lateral  large wound albeit superficial. On the left side he has innumerable number of small new wounds on the left calf particularly laterally but several anteriorly and medially. All these appear to have healthy granulated base these look like the remnants of blisters however they occurred under compression. The patient arrives in clinic today with his legs somewhat better. There is certainly less edema, less multiple open areas on the left calf and the right anterior leg looks somewhat better as well superficial and a little smaller. However he relates pain and erythema over the last 3-4 days in the thigh  and I looked at this today. He has not been systemically unwell no fever no chills no change in blood sugar values 05/25/18; comes in today in a better state. The severe cellulitis on his left leg seems better with the Keflex. Not as tender. He has not been systemically unwell Hard to find an open wound on the left lower leg using his compression pumps twice a day The confluent wounds on his right lateral calf somewhat better looking. These will ultimately need debridement I didn't do this today. 06/01/18; the severe cellulitis on the left anterior thigh has resolved and he is completed his Keflex. There is no open wound on the left leg however there is a superficial excoriation at the base of the third toe dorsally. Skin on the bottom of his left foot is macerated looking. The left the wounds on the lateral right leg actually looks some better although he did require debridement of the top half of this wound area with an open curet 06/09/18 on evaluation today patient appears to be doing poorly in regard to his right lower extremity in particular this appears to likely be infected he has very thick purulent discharge along with a bright green tent to the discharge. This makes me concerned about the possibility of pseudomonas. He's also having increased discomfort at this point on evaluation. Fortunately there does not appear to be any evidence of infection spreading to the other location at this time. 06/16/18 on evaluation today patient appears to actually be doing fairly well. His ulcer has actually diminished in size quite significantly at this point which is good news. Nonetheless he still does have some evidence of infection he did see infectious disease this morning before coming here for his appointment. I did review the results of their evaluation and their note today. They did actually have him discontinue the Cipro and initiate treatment with linezolid at this time. He is doing this for the  next seven days and they recommended a follow-up in four months with them. He is the keep a log of the need for intermittent antibiotic therapy between now and when he falls back up with infectious disease. This will help them gaze what exactly they need to do to try and help them out. 06/23/18; the patient arrives today with no open wounds on the left leg and left third toe healed. He is been using his compression pumps twice a day. On the right lateral leg he still has a sizable wound but this is a lot better than last time I saw this. 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 Has been using silver alginate under 4 layer compression 06/30/18; the only open wound he has is on the right lateral leg and this looks healthy. No debridement is required. We have been using silver  alginate. He does not have an open wound on the left leg. There is apparently some drainage from the dorsal proximal third toe on the left although I see no open wound here. 07/03/18 on evaluation today patient was actually here just for a nurse visit rapid change. However when he was here on Wednesday for his rat change due to having been healed on the left and then developing blisters we initiated the wrap again knowing that he would be back today for [place] to reevaluate and see were at. Unfortunately he has developed some cellulitis into the proximal portion of his right lower extremity even into the region of his thigh. 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 He was placed on one as what at that point. Nonetheless he is done with that and has been tolerating it well otherwise. Doxycycline which in the past really did not seem to be effective for him. Nonetheless I think the best option may be for [place] to definitely reinitiate the antibiotics for a longer period of time. 07/07/18; since I  last saw this patient a week ago he has had a difficult time. At that point he did not have an open wound on his left leg. We transitioned him into juxta light stockings. He was apparently in the clinic the next day with blisters on the left lateral and left medial lower calf. He also had weeping edema fluid. He was put back into a compression wrap. He was also in the clinic on Friday with intense erythema in his right thigh. Per the patient he was started on Bactrim however that didn't work at all in terms of relieving his pain and swelling. He has taken 3 doxycycline that he had left over from last time and that seems to of helped. He has blistering on the right thigh as well. 07/14/18; the erythema on his right thigh has gotten better with doxycycline that he is finishing. 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 Presumably this cellulitis in the thigh was not related to that although I think this is a bit concerning going forward. He still has an area on the right lateral calf the blister on the right medial calf just below the knee that was discussed above. On the left 2 small open areas left medial and left lateral. Edema control is adequate. He is using his compression pumps twice a day 07/20/18; continued improvement in the condition of both legs especially the edema in his bilateral thighs. He tells me he is been losing weight through a combination of diet and exercise. He is using his compression pumps twice a day. So overall she made to the remaining wounds 07/27/2018; continued improvement in condition of both legs. His edema is well controlled. The area on the right lateral leg is just about closed he had one blisters show up on the medial left upper calf. We have him in 4 layer compression. He is going on a 10-day trip to [place], [place] and [place]. He will be driving. He wants to wear Unna boots because of  the lessening amount of constriction. He will not use compression pumps while he is away 08/05/18 on evaluation today patient actually appears to be doing decently well all things considered in regard to his bilateral lower extremities. The worst ulcer is actually only posterior aspect of his left lower  extremity with a four layer compression wrap cut into his leg a couple weeks back. He did have a trip and actually had [employer] for the trip that he is worn since he was last here. Nonetheless he feels like the [employer] actually do better for him his swelling is up a little bit but he also with his trip was not taking his Lasix on a regular set schedule like he was supposed to be. He states that obviously the reason being that he cannot drive and keep going without having to urinate too frequently which makes it difficult. He did not have his pumps with him while he was away either which I think also maybe playing a role here too. 08/13/2018; the patient only has a small open wound on the right lateral calf which is a big improvement in the last month or 2. He also has the area posteriorly just below the posterior fossa on the left which I think was a wrap injury from several weeks ago. He has no current evidence of cellulitis. He tells me he is back into his compression pumps twice a day. He also tells me that while he was at the laundromat somebody stole a section of his extremitease stockings 08/20/2018; back in the clinic with a much improved state. He only has small areas on the right lateral mid calf which is just about healed. This was is more substantial area for quite a prolonged period of time. He has a small open area on the left anterior tibia. The area on the posterior calf just below the popliteal fossa is closed today. He is using his compression pumps twice a day 08/28/2018; patient has no open wound on the right leg. He has a smattering of open areas on the calf with some  weeping lymphedema. More problematically than that it looks as though his wraps of slipped down in his usual he has very angry upper area of edema just below the right medial knee and on the right lateral calf. He has no open area on his feet. The patient is traveling to [facility] next week. I will send him in an antibiotic. We will continue to wrap the right leg. We ordered extremitease stockings for him last week and I plan to transition the right leg to a stocking when he gets home which will be in 10 days time. As usual he is very reluctant to take his pumps with him when he travels 09/07/2018; patient returns from [facility]. He shows me a picture of his left leg in the mid part of his trip last week with intense fire engine erythema. The picture look bad enough I would have considered sending him to the hospital. Instead he went to the wound care center in [facility]. They did not prescribe him antibiotics but he did take some doxycycline he had leftover from a previous visit. I had given him trimethoprim sulfamethoxazole before he left this did not work according to the patient. This is resulted in some improvement fortunately. He comes back with a large wound on the left posterior calf. Smaller area on the left anterior tibia. Denuded blisters on the dorsal left foot over his toes. Does not have much in the way of wounds on the right leg although he does have a very tender area on the right posterior area just below the popliteal fossa also suggestive of infection. He promises me he is back on his pumps twice a  day 09/15/2018; the intense cellulitis in his left lower calf is a lot better. The wound area on the posterior left calf is also so better. However he has reasonably extensive wounds on the dorsal aspect of his second and third toes and the proximal foot just at the base of the toes. There is nothing open on the right leg 09/22/2018; the patient has excellent edema  control in his legs bilaterally. He is using his external compression pumps twice a day. He has no open area on the right leg and only the areas in the left foot dorsally second and third toe area on the left side. He does not have any signs of active cellulitis. 10/06/2018; the patient has good edema control bilaterally. He has no open wound on the right leg. There is a blister in the posterior aspect of his left calf that we had to deal with today. He is using his compression pumps twice a day. There is no signs of active cellulitis. We have been using silver alginate to the wound areas. He still has vulnerable areas on the base of his left first second toes dorsally He has a his extremities stockings and we are going to transition him today into the stocking on the right leg. He is cautioned that he will need to continue to use the compression pumps twice a day. If he notices uncontrolled edema in the right leg he may need to go to 3 times a day. 10/13/2018; the patient came in for a nurse check on Friday he has a large flaccid blister on the right medial calf just below the knee. We unroofed this. He has this and a new area underneath the posterior mid calf which was undoubtedly a blister as well. He also has several small areas on the right which is the area we put his extremities stocking on. 10/19/2018; the patient went to see infectious disease this morning I am not sure if that was a routine follow-up in any case the doxycycline I had given him was discontinued and started on linezolid. He has not started this. It is easy to look at his left calf and the inflammation and think this is cellulitis however he is very tender in the tissue just below the popliteal fossa and I have no doubt that there is infection going on here. He states the problem he is having is that with the compression pumps the edema goes down and then starts walking the wrap falls down. We will see if we can adhere this. He  has 1 or 2 minuscule open areas on the right still areas that are weeping on the posterior left calf, the base of his left second and third toes 10/26/18; back today in clinic with quite of skin breakdown in his left anterior leg. This may have been infection the area below the popliteal fossa seems a lot better however tremendous epithelial loss on the left anterior mid tibia area over quite inexpensive tissue. He has 2 blisters on the right side but no other open wound here. 10/29/2018; came in urgently to see [place] today and we worked him in for review. He states that the 4 layer compression on the right leg caused pain he had to cut it down to roughly his mid calf this caused swelling above the wrap and he has blisters and skin breakdown today. As a result of the pain he has not been using his pumps. Both legs are a lot more edematous and there  is a lot of weeping fluid. 11/02/18; arrives in clinic with continued difficulties in the right leg> left. Leg is swollen and painful. multiple skin blisters and new open areas especially laterally. He has not been using his pumps on the right leg. 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 He is not systemically unwell. 11/09/2018; the patient claims he is being compliant with his pumps. He is finished the doxycycline I gave him last week. Culture I did of the wound on the right lateral leg showed a few very resistant methicillin staph aureus. This was resistant to doxycycline. Nevertheless he states the pain in the leg is a lot better which makes me wonder if the cultured organism was not really what was causing the problem nevertheless this is a very dangerous organism to be culturing out of any wound. His right leg is still a lot larger than the left. He is using an Radio broadcast assistant on this area, he blames a 4-layer compression for causing the original skin breakdown which I doubt is true however I cannot talk him out  of it. We have been using silver alginate to all of these areas which were initially blisters 11/16/2018; patient is being compliant with his external compression pumps at twice a day. Miraculously he arrives in clinic today with absolutely no open wounds. He has better edema control on the left where he has been using 4 layer compression versus wound of wounds on the right and I pointed this out to him. There is no inflammation in the skin in his lower legs which is also somewhat unusual for him. There is no open wounds on the dorsal left foot. He has extremitease stockings at home and I have asked him to bring these in next week. 11/25/18 patient's lower extremity on examination today on the left appears for the most part to be wound free. He does have an open wound on the lateral aspect of the right lower extremity but this is minimal compared to what I've seen in past. He does request that we go ahead and wrap the left leg as well even though there's nothing open just so hopefully it will not reopen in short order. 1/28; patient has superficial open wounds on the right lateral calf left anterior calf and left posterior calf. His edema control is adequate. 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 We have been using silver alginate as the primary dressing. He claims compliance with his compression pumps 2/4; patient has superficial open wounds on numerous areas of his left calf and again one on the left dorsal foot. The areas on the right lateral calf have healed. The cellulitis that I gave him doxycycline for last week is also resolved this was mostly on the left anterior calf just below the tibial tuberosity. His edema looks fairly well-controlled. He tells me he went to see his primary doctor today and had blood work ordered 2/11; once again he has several open areas on the left calf left tibial area. Most of  these are small and appear to have healthy granulation. He does not have anything open on the right. The edema and control in his thighs is pretty good which is usually a good indication he has been using his pumps as requested. 2/18; he continues to have several small areas on the left calf and left tibial area. Most of  these are small healthy granulation. We put him in his stocking on the right leg last week and he arrives with a superficial open area over the right upper tibia and a fairly large area on the right lateral tibia in similar condition. His edema control actually does not look too bad, he claims to be using his compression pumps twice a day 2/25. Continued small areas on the left calf and left tibial area. New areas especially on the right are identified just below the tibial tuberosity and on the right upper tibia itself. There are also areas of weeping edema fluid even without an obvious wound. He does not have a considerable degree of lymphedema but clearly there is more edema here than his skin can handle. He states he is using the pumps twice a day. We have an Unna boot on the right and 4 layer compression on the left. 3/3; he continues to have an area on the right lateral calf and right posterior calf just below the popliteal fossa. There is a fair amount of tenderness around the wound on the popliteal fossa but I did not see any evidence of cellulitis, could just be that the wrap came down and rubbed in this area. He does not have an open area on the left leg however there is an area on the left dorsal foot at the base of the third toe We have been using silver alginate to all wound areas 3/10; he did not have an open area on his left leg last time he was here a week ago. T oday he arrives with a horizontal wound just below the tibial tuberosity and an area on the left lateral calf. He has intense erythema and tenderness in this area. The area is on the right lateral calf and  right posterior calf better than last week. We have been using silver alginate as usual 3/18 - Patient returns with 3 small open areas on left calf, and 1 small open area on right calf, the skin looks ok with no significant erythema, he continues the UNA boot on right and 4 layer compression on left. The right lateral calf wound is closed , the right posterior is small area. we will continue silver alginate to the areas. 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 He is been tolerating the dressing changes without complication. Fortunately she has made excellent improvement in regard to the overall status of his wounds. Unfortunately every time we cease wrapping him he ends up reopening in causing more significant issues at that point. Again I'm unsure of the best direction to take although I think the lymphedema clinic may be appropriate for him. 02/03/19 on evaluation today patient appears to be doing well in regard to the wounds that we saw him for last week unfortunately he has a new area on the proximal portion of his right medial/posterior lower extremity where the wrap somewhat slowed down and caused swelling and a blister to rub and open. Unfortunately this is the only opening that he has on either leg at this point. 02/17/19 on evaluation today patient's bilateral lower extremities appear to be doing well. He still completely healed in regard to the left lower extremity. In regard to the right lower extremity the area where the wrap and slid down and caused the  blister still seems to be slightly open although this is dramatically better than during the last evaluation two weeks ago. I'm very pleased with the way this stands overall. 03/03/19 on evaluation today patient appears to be doing well in regard  to his right lower extremity in general although he did have a new blister open this does not appear to be showing any evidence of active infection at this time. Fortunately there's No fevers, chills, nausea, or vomiting noted at this time. Overall I feel like he is making good progress it does feel like that the right leg will we perform the [employer] seems to do with a bit better than three layer wrap on the left which slid down on him. We may switch to doing bilateral in the book wraps. 5/4; I have not seen Mr. Lawrie in quite some time. According to our case manager he did not have an open wound on his left leg last week. He had 1 remaining wound on the right posterior medial calf. He arrives today with multiple openings on the left leg probably were blisters and/or wrap injuries from Unna boots. I do not think the Unna boot's will provide adequate compression on the left. I am also not clear about the frequency he is using the compression pumps. 03/17/19 on evaluation today patient appears to be doing excellent in regard to his lower extremities compared to last week's evaluation apparently. He had gotten significantly worse last week which is unfortunate. The [employer] wrap on the left did not seem to do very well for him at all and in fact it didn't control his swelling significantly enough he had an additional outbreak. Subsequently we go back to the four layer compression wrap on the left. This is good news. At least in that he is doing better and the wound seem to be killing him. He still has not heard anything from the lymphedema clinic. 03/24/19 on evaluation today patient actually appears to be doing much better in regard to his bilateral lower Trinity as compared to last week when I saw him. Fortunately there's no signs of active infection at this time. He has been tolerating the dressing changes without complication. Overall I'm extremely pleased with the progress and appearance in  general. 04/07/19 on evaluation today patient appears to be doing well in regard to his bilateral lower extremities. His swelling is significantly down from where it was previous. With that being said he does have a couple blisters still open at this point but fortunately nothing that seems to be too severe and again the majority of the larger openings has healed at this time. 04/14/19 on evaluation today patient actually appears to be doing quite well in regard to his bilateral lower extremities in fact I'm not even sure there's anything significantly open at this time at any site. Nonetheless he did have some trouble with these wraps where they are somewhat irritating him secondary to the fact that he has noted that the graph wasn't too close down to the end of this foot in a little bit short as well up to his knee. Otherwise things seem to be doing quite well. 04/21/19 upon evaluation today patient's wound bed actually showed evidence of being completely healed in regard to both lower extremities which is excellent news. There does not appear to be any signs of active infection which is also good news. I'm very pleased in this regard. No fevers, chills, nausea, or vomiting noted at this time.  04/28/19 on evaluation today patient appears to be doing a little bit worse in regard to both lower extremities on the left mainly due to the fact that when he went infection disease the wrap was not wrapped quite high enough he developed a blister above this. On the right he is a small open area of nothing too significant but again this is continuing to give him some trouble he has been were in the Velcro compression that he has at home. 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 He's been tolerating the dressing changes without complication. Fortunately there's no signs of active infection at this time. No fevers, chills, nausea, or vomiting noted at  this time. We have been trying to get an appointment with her lymphedema clinic in [facility] but unfortunately nobody can get them on phone with not been able to even fax information over the patient likewise is not been able to get in touch with them. Overall I'm not sure exactly what's going on here with to reach out again today. 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 Still having a lot of drainage unfortunately. He tells me especially in the left but even on the right. There's no signs of active infection which is good news we've been using so ratcheted up to this point. 05/19/19 on evaluation today patient actually appears to be doing quite well with regard to his left lower extremity which is great news. Fortunately in regard to the right lower extremity has an issues with his wrap and he subsequently did remove this from what I'm understanding. Nonetheless long story short is what he had rewrapped once he removed it subsequently had maggots underneath this wrap whenever he came in for evaluation today. With that being said they were obviously completely cleaned away by the nursing staff. The visit today which is excellent news. However he does appear to potentially have some infection around the right ankle region where the maggots were located as well. He will likely require anabiotic therapy today. 05/26/19 on evaluation today patient actually appears to be doing much better in regard to his bilateral lower extremities. I feel like the infection is under much better control. With that being said there were maggots noted when the wrap was removed yet again today. Again this could have potentially been left over from previous although at this time there does not appear to be any signs of significant drainage there was obviously on the wrap some drainage as well this contracted gnats or  otherwise. Either way I do not see anything that appears to be doing worse in my pinion and in fact I think his drainage has slowed down quite significantly likely mainly due to the fact to his infection being under better control. 06/02/2019 on evaluation today patient actually appears to be doing well with regard to his bilateral lower extremities there is no signs of active infection at this time which is great news. With that being said he does have several open areas more so on the right than the left but nonetheless these are all significantly better than previously noted. 06/09/2019 on evaluation today patient actually appears to be doing well. His wrap stayed up and he did not cause any problems he had more drainage on the right compared to the left but overall I do not see any major issues  at this time which is great news. 06/16/2019 on evaluation today patient appears to be doing excellent with regard to his lower extremities the only area that is open is a new blister that can have opened as of today on the medial ankle on the left. Other than this he really seems to be doing great I see no major issues at this point. 06/23/2019 on evaluation today patient appears to be doing quite well with regard to his bilateral lower extremities. In fact he actually appears to be almost completely healed there is a small area of weeping noted of the right lower extremity just above the ankle. Nonetheless fortunately there is no signs of active infection at this time which is good news. No fevers, chills, nausea, vomiting, or diarrhea. 8/24; the patient arrived for a nurse visit today but complained of very significant pain in the left leg and therefore I was asked to look at this. Noted that he did not have an open area on the left leg last week nevertheless this was wrapped. The patient states that he is not been able to put his compression pumps on the left leg because of the discomfort. He has not been  systemically unwell 06/30/2019 on evaluation today patient unfortunately despite being excellent last week is doing much worse with regard to his left lower extremity today. In fact he had to come in for a nurse on Monday where his left leg had to be rewrapped due to excessive weeping Dr. Leanord Hawking placed him on doxycycline at that point. Fortunately there is no signs of active infection Systemically at this time which is good news. 07/07/2019 in regard to the patient's wounds today he actually seems to be doing well with his right lower extremity there really is nothing open or draining at this point this is great news. Unfortunately the left lower extremity is given him additional trouble at this time. There does not appear to be any signs of active infection nonetheless he does have a lot of edema and swelling noted at this point as well as blistering all of which has led to a much more poor appearing leg at this time compared to where it was 2 weeks ago when it was almost completely healed. Obviously this is a little discouraging for the patient. He is try to contact the lymphedema clinic in [place] he has not been able to get through to them. 07/14/2019 on evaluation today patient actually appears to be doing slightly better with regard to his left lower extremity ulcers. Overall I do feel like at least at the top of the wrap that we have been placing this area has healed quite nicely and looks much better. The remainder of the leg is showing signs of improvement. Unfortunately in the thigh area he still has an open region on the left and again on the right he has been utilizing just a Band-Aid on an area that also opened on the thigh. Again this is an area that were not able to wrap although we did do an Ace wrap to provide some compression that something that obviously is a little less effective than the compression wraps we have been using on the lower portion of the leg. He does have an  appointment with the lymphedema clinic in [facility] on Friday. 07/21/2019 on evaluation today patient appears to be doing better with regard to his lower extremity ulcers. He has been tolerating the dressing changes without complication. Fortunately there is no signs of  active infection at this time. No fevers, chills, nausea, vomiting, or diarrhea. I did receive the paperwork from the physical therapist at the lymphedema clinic in [place]. Subsequently I signed off on that this morning and sent that back to him for further progression with the treatment plan. 07/28/2019 on evaluation today patient appears to be doing very well with regard to his right lower extremity where I do not see any open wounds at this point. Fortunately he is feeling great as far as that is concerned as well. In regard to the left lower extremity he has been having issues with still several areas of weeping and edema although the upper leg is doing better his lower leg still I think is going require the compression wrap at this time. No fevers, chills, nausea, vomiting, or diarrhea. 08/04/2019 on evaluation today patient unfortunately is having new wounds on the right lower extremity. Again we have been using Unna boot wrap on that side. We switched him to using his juxta lite wrap at home. With that being said he tells me he has been using it although his legs extremely swollen and to be honest really does not appear that he has been. I cannot know that for sure however. Nonetheless he has multiple new wounds on the right lower extremity at this time. Obviously we will have to see about getting this rewrapped for him today. 08/11/2019 on evaluation today patient appears to be doing fairly well with regard to his wounds. He has been tolerating the dressing changes including the compression wraps without complication. He still has a lot of edema in his upper thigh regions bilaterally he is supposed to be seeing the  lymphedema clinic on the 15th of this month once his wraps arrive for the upper part of his legs. 08/18/2019 on evaluation today patient appears to be doing well with regard to his bilateral lower extremities at this point. He has been tolerating the dressing changes without complication. Fortunately there is no signs of active infection which is also good news. He does have a couple weeping areas on the first and second toe of the right foot he also has just a small area on the left foot upper leg and a small area on the left lower leg but overall he is doing quite well in my opinion. He is supposed to be getting his wraps shortly in fact tomorrow and then subsequently is seeing the lymphedema clinic next Wednesday on the 21st. Of note he is also leaving on the 25th to go on vacation for a week to the beach. For that reason and since there is some uncertainty about what there can be doing at lymphedema clinic next Wednesday I am get a make an appointment for next Friday here for [place] to see what we need to do for him prior to him leaving for vacation. 10/23; patient arrives in considerable pain predominantly in the upper posterior calf just distal to the popliteal fossa also in the wound anteriorly above the major wound. This is probably cellulitis and he has had this recurrently in the past. He has no open wound on the right side and he has had an Radio broadcast assistant in that area. Finally I note that he has an area on the left posterior calf which by enlarge is mostly epithelialized. This protrudes beyond the borders of the surrounding skin in the setting of dry scaly skin and lymphedema. The patient is leaving for [name] on Sunday. Per his longstanding pattern,  he will not take his compression pumps with him predominantly out of fear that they will be stolen. He therefore asked that we put a Unna boot back on the right leg. He will also contact the wound care center in [facility] to see if they can  change his dressing in the mid week. 11/3; patient returned from his vacation to [facility]. He was seen on 1 occasion at their wound care center. They did a 2 layer compression system as they did not have our 4-layer wrap. I am not completely certain what they put on the wounds. They did not change the Unna boot on the right. The patient is also seeing a lymphedema specialist physical therapist in [place]. It appears that he has some compression sleeve for his thighs which indeed look quite a bit better than I am used to seeing. He pumps over these with his external compression pumps. 11/10; the patient has a new wound on the right medial thigh otherwise there is no open areas on the right. He has an area on the left leg posteriorly anteriorly and medially and an area over the left second toe. We have been using silver alginate. He thinks the injury on his thigh is secondary to friction from the compression sleeve he has. 11/17; the patient has a new wound on the right medial thigh last week. He thinks this is because he did not have a underlying stocking for his thigh juxta lite apparatus. He now has this. The area is fairly large and somewhat angry but I do not think he has underlying cellulitis. He has a intact blister on the right anterior tibial area. Small wound on the right great toe dorsally Small area on the medial left calf. 11/30; the patient does not have any open areas on his right leg and we did not take his juxta lite stocking off. However he states that on Friday his compression wrap fell down lodging around his upper mid calf area. As usual this creates a lot of problems for him. He called urgently today to be seen for a nurse visit however the nurse visit turned into a provider visit because of extreme erythema and pain in the left anterior tibia extending laterally and posteriorly. The area that is problematic is extensive 10/06/2019 upon evaluation today patient actually  appears to be doing poorly in regard to his left lower extremity. He Dr. Leanord Hawking did place him on doxycycline this past Monday apparently due to the fact that he was doing much worse in regard to this left leg. Fortunately the doxycycline does seem to be helping. Unfortunately we are still having a very difficult time getting his edema under any type of control in order to anticipate discharge at some point. The only way were really able to control his lymphedema really is with compression wraps and that has only even seemingly temporary. He has been seeing a lymphedema clinic they are trying to help in this regard but still this has been somewhat frustrating in general for the patient. 10/13/19 on evaluation today patient appears to be doing excellent with regard to his right lower extremity as far as the wounds are concerned. His swelling is still quite extensive unfortunately. He is still having a lot of drainage from the thigh areas bilaterally which is unfortunate. He's been going to lymphedema clinic but again he still really does not have this edema under control as far as his lower extremities are concern. With regard to his left  lower extremity this seems to be improving and I do believe the doxycycline has been of benefit for him. He is about to complete the doxycycline. 10/20/2019 on evaluation today patient appears to be doing poorly in regard to his bilateral lower extremities. More in the right thigh he has a lot of irritation at this site unfortunately. In regard to the left lower extremity the wrap was not quite as high it appears and does seem to have caused him some trouble as well. Fortunately there is no evidence of systemic infection though he does have some blue-green drainage which has me concerned for the possibility of Pseudomonas. He tells me he is previously taking Cipro without complications and he really does not care for Levaquin however due to some of the side effects he  has. He is not allergic to any medications specifically antibiotics that were aware of. 10/27/2019 on evaluation today patient actually does appear to be for the most part doing better when compared to last week's evaluation. With that being said he still has multiple open wounds over the bilateral lower extremities. He actually forgot to start taking the Cipro and states that he still has the whole bottle. He does have several new blisters on left lower extremity today I think I would recommend he go ahead and take the Cipro based on what I am seeing at this point. 12/30-Patient comes at 1 week visit, 4 layer compression wraps on the left and Unna boot on the right, primary dressing Xtrasorb and silver alginate. Patient is taking his Cipro and has a few more days left probably 5-6, and the legs are doing better. He states he is using his compressions devices which I believe he has 11/10/2019 on evaluation today patient actually appears to be much better than last time I saw him 2 weeks ago. His wounds are significantly improved and overall I am very pleased in this regard. Fortunately there is no signs of active infection at this time. He is just a couple of days away from completing Cipro. Overall his edema is much better he has been using his lymphedema pumps which I think is also helping at this point. 11/17/2019 on evaluation today patient appears to be doing excellent in regard to his wounds in general. His legs are swollen but not nearly as much as they have been in the past. Fortunately he is tolerating the compression wraps without complication. No fevers, chills, nausea, vomiting, or diarrhea. He does have some erythema however in the distal portion of his right lower extremity specifically around the forefoot and toes there is a little bit of warmth here as well. 11/24/2019 on evaluation today patient appears to be doing well with regard to his right lower extremity I really do not see any open  wounds at this point. His left lower extremity does have several open areas and his right medial thigh also is open. Other than this however overall the patient seems to be making good progress and I am very pleased at this point. 12/01/2019 on evaluation today patient appears to be doing poorly at this point in regard to his left lower extremity has several new blisters despite the fact that we have him in compression wraps. In fact he had a 4-layer compression wrap, his upper thigh wrapped from lymphedema clinic, and a juxta light over top of the 4 layer compression wrap the lymphedema clinic applied and despite all this he still develop blisters underneath. Obviously this does have me concerned  about the fact that unfortunately despite what we are doing to try to get wounds healed he continues to have new areas arise I do not think he is ever good to be at the point where he can realistically just use wraps at home to keep things under control. Typically when we heal him it takes about 1-2 days before he is back in the clinic with severe breakdown and blistering of his lower extremities bilaterally. This is happened numerous times in the past. Unfortunately I think that we may need some help as far as overall fluid overload to kind of limit what we are seeing and get things under better control. 12/08/2019 on evaluation today patient presents for follow-up concerning his ongoing bilateral lower extremity edema. Unfortunately he is still having quite a bit of swelling the compression wraps are controlling this to some degree but he did see Dr. [name] his cardiologist I do have that available for review today as far as the appointment was concerned that was on 12/06/2019. Obviously that she has been 2 days ago. The patient states that he is only been taking the Lasix 80 mg 1 time a day he had told me previously he was taking this twice a day. Nonetheless Dr. [name] recommended this be up to 80 mg 2 times a  day for the patient as he did appear to be fluid overloaded. With that being said the patient states he did this yesterday and he was unable to go anywhere or do anything due to the fact that he was constantly having to urinate. Nonetheless I think that this is still good to be something that is important for him as far as trying to get his edema under control at all things that he is going to be able to just expect his wounds to get under control and things to be better without going through at least a period of time where he is trying to stabilize his fluid management in general and I think increasing the Lasix is likely the first step here. It was also mentioned the possibility that the patient may require metolazone. With that being said he wanted to have the patient take Lasix twice a day first and then reevaluating 2 months to see where things stand. 12/15/2019 upon evaluation today patient appears to be doing regard to his legs although his toes are showing some signs of weeping especially on the left at this point to some degree on the right. There does not appear to be any signs of active infection and overall I do feel like the compression wraps are doing well for him but he has not been able to take the Lasix at home and the increased dose that Dr. [name] recommended. He tells me that just not go to be feasible for him. Nonetheless I think in this case he should probably send a message to Dr. [name] in order to discuss options from the standpoint of possible admission to get the fluid off or otherwise going forward. 12/22/2019 upon evaluation today patient appears to be doing fairly well with regard to his lower extremities at this point. In fact he would be doing excellent if it was not for the fact that his right anterior thigh apparently had an allergic reaction to adhesive tape that he used. The wound itself that we have been monitoring actually appears to be healed. There is a lot of  irritation at this point. 12/29/2019 upon evaluation today patient appears to be doing well  in regard to his lower extremities. His left medial thigh is open and somewhat draining today but this is the only region that is open the right has done much better with the treatment utilizing the steroid cream that I prescribed for him last week. Overall I am pleased in that regard. Fortunately there is no signs of active infection at this time. No fevers, chills, nausea, vomiting, or diarrhea. 01/05/2020 upon evaluation today patient appears to be doing more poorly in regard to his right lower extremity at this point upon evaluation today. Unfortunately he continues to have issues in this regard and I think the biggest issue is controlling his edema. This obviously is not very well controlled at this point is been recommended that he use the Lasix twice a day but he has not been able to do that. Unfortunately I think this is leading to an issue where honestly he is not really able to effectively control his edema and therefore the wounds really are not doing significantly better. I do not think that he is going to be able to keep things under good control unless he is able to control his edema much better. I discussed this again in great detail with him today. 01/12/2020 good news is patient actually appears to be doing quite well today at this point. He does have an appointment with lymphedema clinic tomorrow. His legs appear healed and the toe on the left is almost completely healed. In general I am very pleased with how things stand at this point. 01/19/2020 upon evaluation today patient appears to actually be doing well in regard to his lower extremities there is nothing open at this point. Fortunately he has done extremely well more recently. Has been seeing lymphedema clinic as well. With that being said he has Velcro wraps for his lower legs as well as his upper legs. The only wound really is on his toe  which is the right great toe and this is barely anything even there. With all that being said I think it is good to be appropriate today to go ahead and switch him over to the Velcro compression wraps. 01/26/2020 upon evaluation today patient appears to be doing worse with regard to his lower extremities after last week switch him to Velcro compression wraps. Unfortunately he lasted less than 24 hours he did not have the sock portion of his Velcro wrap on the left leg and subsequently developed a blister underneath the Velcro portion. Obviously this is not good and not what we were looking for at this point. He states the lymphedema clinic did tell him to wear the wrap for 23 hours and take him off for 1 I am okay with that plan but again right now we got a get things back under control again he may have some cellulitis noted as well. 02/02/2020 upon evaluation today patient unfortunately appears to have several areas of blistering on his bilateral lower extremities today mainly on the feet. His legs do seem to be doing somewhat better which is good news. Fortunately there is no evidence of active infection at this time. No fevers, chills, nausea, vomiting, or diarrhea. 02/16/2020 upon evaluation today patient appears to be doing well at this time with regard to his legs. He has a couple weeping areas on his toes but for the most part everything is doing better and does appear to be sealed up on his legs which is excellent news. We can continue with wrapping him at this point  as he had every time we discontinue the wraps he just breaks out with new wounds. There is really no point in is going forward with this at this point. 03/08/2020 upon evaluation today patient actually appears to be doing quite well with regard to his lower extremity ulcers. He has just a very superficial and really almost nonexistent blister on the left lower extremity he has in general done very well with the compression wraps. With  that being said I do not see any signs of infection at this time which is good news. 03/29/2020 upon evaluation today patient appears to be doing well with regard to his wounds currently except for where he had several new areas that opened up due to some of the wrap slipping and causing him trouble. He states he did not realize they had slipped. Nonetheless he has a 1 area on the right and 3 new areas on the left. Fortunately there is no signs of active infection at this time which is great news. 04/05/2020 upon evaluation today patient actually appears to be doing quite well in general in regard to his legs currently. Fortunately there is no signs of active infection at this time. No fevers, chills, nausea, vomiting, or diarrhea. He tells me next week that he will actually be seen in the lymphedema clinic on Thursday at 10 AM I see him on Wednesday next week. 04/12/2020 upon evaluation today patient appears to be doing very well with regard to his lower extremities bilaterally. In fact he does not appear to have any open wounds at this point which is good news. Fortunately there is no signs of active infection at this time. No fevers, chills, nausea, vomiting, or diarrhea. 04/19/2020 upon evaluation today patient appears to be doing well with regard to his wounds currently on the bilateral lower extremities. There does not appear to be any signs of active infection at this time. Fortunately there is no evidence of systemic infection and overall very pleased at this point. Nonetheless after I held him out last week he literally had blisters the next morning already which swelled up with him being right back here in the clinic. Overall I think that he is just not can be able to be discharged with his legs the way they are he is much to volume overloaded as far as fluid is concerned and that was discussed with him today of also discussed this but should try the clinic nurse manager as well as Dr.  Leanord Hawking. 04/26/2020 upon evaluation today patient appears to be doing better with regard to his wounds currently. He is making some progress and overall swelling is under good control with the compression wraps. Fortunately there is no evidence of active infection at this time. 05/10/2020 on evaluation today patient appears to be doing overall well in regard to his lower extremities bilaterally. He is Tolerating the compression wraps without complication and with what we are seeing currently I feel like that he is making excellent progress. There is no signs of active infection at this time. 05/24/2020 upon evaluation today patient appears to be doing well in regard to his legs. The swelling is actually quite a bit down compared to where it has been in the past. Fortunately there is no sign of active infection at this time which is also good news. With that being said he does have several wounds on his toes that have opened up at this point. 05/31/2020 upon evaluation today patient appears to be doing well  with regard to his legs bilaterally where he really has no significant fluid buildup at this point overall he seems to be doing quite well. Very pleased in this regard. With regard to his toes these also seem to be drying up which is excellent. We have continue to wrap him as every time we tried as a transition to the juxta light wraps things just do not seem to get any better. 06/07/2020 upon evaluation today patient appears to be doing well with regard to his right leg at this point. Unfortunately left leg has a lot of blistering he tells me the wrap started to slide down on him when he tried to put his other Velcro wrap over top of it to help keep things in order but nonetheless still had some issues. 06/14/2020 on evaluation today patient appears to be doing well with regard to his lower extremity ulcers and foot ulcers at this point. I feel like everything is actually showing signs of improvement which  is great news overall there is no signs of active infection at this time. No fevers, chills, nausea, vomiting, or diarrhea. 06/21/2020 on evaluation today patient actually appears to be doing okay in regard to his wounds in general. With that being said the biggest issue I see is on his right foot in particular the first and second toe seem to be doing a little worse due to the fact this is staying very wet. I think he is probably getting need to change out his dressings a couple times in between each week when we see him in regard to his toes in order to keep this drier based on the location and how this is proceeding. 06/28/2020 on evaluation today patient appears to be doing a little bit more poorly overall in regard to the appearance of the skin I am actually somewhat concerned about the possibility of him having a little bit of an infection here. We discussed the course of potentially giving him a doxycycline prescription which he is taken previously with good result. With that being said I do believe that this is potentially mild and at this point easily fixed. I just do not want anything to get any worse. 07/12/2020 upon evaluation today patient actually appears to be making some progress with regard to his legs which is great news there does not appear to be any evidence of active infection. Overall very pleased with where things stand. 07/26/2020 upon evaluation today patient appears to be doing well with regard to his leg ulcers and toe ulcers at this point. He has been tolerating the compression wraps without complication overall very pleased in this regard. 08/09/2020 upon evaluation today patient appears to be doing well with regard to his lower extremities bilaterally. Fortunately there is no signs of active infection overall I am pleased with where things stand. 08/23/2020 on evaluation today patient appears to be doing well with regard to his wound. He has been tolerating the dressing  changes without complication. Fortunately there is no signs of active infection at this time. Overall his legs seem to be doing quite well which is great news and I am very pleased in that regard. No fevers, chills, nausea, vomiting, or diarrhea. 09/13/2020 upon evaluation today patient appears to be doing okay in regard to his lower extremities. He does have a fairly large blister on the right leg which I did remove the blister tissue from today so we can get this to dry out other than that however he  seems to be doing quite well. There is no signs of active infection at this time. 09/27/2020 upon evaluation today patient appears to actually be doing some better in regard to his right leg. Fortunately signs of active infection at this time which is great news. No fevers, chills, nausea, vomiting, or diarrhea. 10/04/2020 upon evaluation today patient actually appears to be showing signs of improvement which is great news with regard to his leg ulcers. Fortunately there is no signs of active infection which is great news he is still taking the antibiotics currently. No fevers, chills, nausea, vomiting, or diarrhea. 10/18/2020 on evaluation today patient appears to be doing well with regard to his legs currently. He has been tolerating the dressing changes including the wraps without complication. Fortunately there is no signs of active infection at this time. No fevers, chills, nausea, vomiting, or diarrhea. 10/25/2020 upon evaluation today patient appears to be doing decently well in regard to his wounds currently. He has been tolerating the dressing changes without complication. Overall I feel like he is making good progress albeit slow. Again this is something we can have to continue to wrap for some time to come most likely. 11/08/2020 upon evaluation today patient appears to be doing well with regard to his wounds currently. He has been tolerating the dressing changes without complication is not  currently on any antibiotics and he does not appear to show any signs of infection. He does continue to have a lot of drainage on the right leg not too severe but nonetheless this is very scattered. On the left leg this is looking to be much improved overall. 11/15/2020 upon evaluation today patient appears to be doing better with regard to his legs bilaterally. Especially the right leg which was much more significant last week. There does not appear to be any signs of active infection which is great news. No fevers, chills, nausea, vomiting, or diarrhea. 11/23/2019 upon evaluation today patient appears to be doing poorly still in regard to his lower extremities bilaterally. Unfortunately his right leg in particular appears to be doing much more poorly there is no signs really of infection this is not warm to touch but he does have a lot of drainage and weeping unfortunately. With that reason I do believe that we may need to initiate some treatment here to try to help calm down some of the swelling of the right leg. I think switching to a 4-layer compression wrap would be beneficial here. The patient is in agreement with giving this a try. 11/29/2020 upon evaluation today patient appears to be doing well currently in regard to his leg ulcers. I feel like the right leg is doing better he still has a lot of drainage but we do see some improvement here. The 4-layer compression wrap I think was helpful. 12/06/2020 upon evaluation today patient appears to be doing well with regard to his legs. In fact they seem to be doing about the best I have seen up to this point. Fortunately there is no signs of active infection at this time. No fevers, chills, nausea, vomiting, or diarrhea. 12/20/2020 upon evaluation today patient appears to be doing well at this time in regard to his legs. He is not having any significant draining which is great news. Fortunately there is no signs of active infection at this time. No fevers,  chills, nausea, vomiting, or diarrhea. 01/17/2021 upon evaluation today Verdon CumminsJesse actually appears to be doing excellent in regard to his legs. He has a  few areas again that come and go as far as his toes are concerned but overall this is doing quite well. 01/31/2021 upon evaluation today patient appears to be doing well with regard to his legs. Fortunately there does not appear to be any signs of active infection which is great news. Overall he is still having significant edema despite the compression wraps basically the 4-layer compression wrap to just keep things under control there is really not much room for play. 4/13: Mr. Rushing is a longstanding patient in our clinic and benefits greatly from weekly compression wraps. Today he has no complaints. He has been tolerating the wraps well. He states he is using the lymphedema pumps at home. 5/4; patient presents for follow-up of his chronic lymphedema/venous insufficiency ulcers. He comes weekly for compression wraps. He has no complaints today. He was unable to tolerate the Coflex 2 layer Last week so we will do the four press 4-layer compression. He has been using his lymphedema pumps daily. 5/18; patient presents for 2-week follow-up. He has no complaints or issues today. He has developed a new wound to the right foot on his fourth toe. He overall feels well and denies signs of infection. 6/1; patient presents for 2-week follow-up. He has no complaints or issues today. He denies signs of infection. 04/18/2021 upon evaluation today patient appears to be doing well with regard to his legs bilaterally. Family open wound is actually on the toe of his left foot everything else is completely closed which is great news. In general I am extremely pleased with where things stand at this point. The patient is also happy that things are doing so well. 05/02/2021 upon evaluation today patient's legs actually appear to be doing quite well today. Fortunately there  does not appear to be any signs of active infection which is great and overall I am extremely pleased with where he stands today. The patient does not appear to have any evidence of active infection at this time which is also great news. 05/09/2021 upon evaluation today patient appears to be doing a little bit more poorly in regard to his legs. Unfortunately he is having issues with some breakdown and a blood blister on the left leg this is due to I believe honestly to how it was wrapped last week. Fortunately there does not appear to be any signs of infection but nonetheless this is still a concern to be honest. No fevers, chills, nausea, vomiting, or diarrhea. 05/16/2021 upon evaluation today patient appears to be doing significantly better as compared to last week. I am very pleased with where things stand today. There does not appear to be any signs of infection which is great news and overall very pleased with where we stand. No fevers, chills, nausea, vomiting, or diarrhea. 05/30/2021 upon evaluation today patient appears to be doing well with regard to his legs. He has been tolerating the dressing changes without complication. Fortunately there does not appear to be any signs of active infection which is great news and overall I am extremely pleased with where things stand today. No fevers, chills, nausea, vomiting, or diarrhea. 06/20/2021 upon evaluation today patient actually appears to be making good progress today and very pleased with what we are seeing. I think his legs are really maintaining. As long as we continue wrapping he seems to be doing excellent in my opinion. Fortunately there is no signs of active infection at this time. No fevers, chills, nausea, vomiting, or diarrhea. 07/11/2021 upon evaluation  today patient actually appears to be making excellent progress at this time. Fortunately there does not appear to be any evidence of active infection which is great news and overall I am  extremely pleased with where things stand today. No fevers, chills, nausea, vomiting, or diarrhea. 07/25/2021 upon evaluation today patient appears to be doing well currently in regard to his lower extremities. He has been making good progress here and I do not see anything that is actually open significantly today this is great news. No fevers, chills, nausea, vomiting, or diarrhea. 08/08/2021 upon evaluation today patient appears to be doing well with regard to his wound. He has been tolerating the dressing changes without complication. With that being said unfortunately has a new area that opened up as far as his right posterior leg is concerned this was a blister he also has an area on the third toe right foot which also reopen. Fortunately there is no signs of active infection at this time which is great news. No fevers, chills, nausea, vomiting, or diarrhea. 10/17; patient came in today at his request initially for a nurse visit because it but out of concern for deterioration in both his lower legs and cellulitis I was asked to look at him. He comes in with increased swelling which he says started over the weekend he started to notice pain as well in his left medial ankle, right knee, left knee left dorsal foot. His wraps fell down contributing to some of this but he has not been using his compression pumps over the weekend for reasons that are not really clear. He comes in with multiple new wounds including the right posterior leg, right third toe, right fourth toe, left second toe left medial ankle left dorsal ankle and right anterior lower leg 09/19/2021 upon evaluation today patient does seem to be making improvements in general which is great news. I do not see any evidence of infection currently he does have some hypergranulation of the anterior portion of his ankle on the left side this is going require some debridement to pare this down and then subsequently silver nitrate probably due to  the amount of bleeding that he is probably going to experience. He is in agreement with this plan however. 09/26/2021 upon evaluation today although the patient's legs appear to be doing okay today unfortunately he did have maggots noted during the evaluation as well. This is again quite unfortunate with the respect to the fact that he feels like that he got the wrap wet which was noted today he is not sure when and I think this is what led to the issue. Nonetheless we can try to see what we can do about silly things off so that this does not happen again in the future although he is can have to be diligent about taking care of his wraps as well. 10/03/2021 upon evaluation today patient appears to be doing okay in regard to his legs currently. He unfortunately has maggots again noted on the left foot. This obviously is becoming quite an issue to be perfectly honest. I do think that he needs to see what can be done at home to try to limit this exposure. Last week we had cleaned everything away so this is a new infestation as far as that is concerned. There is really not much that I can do to combat that I am trying to do what we can here in the clinic to wrap his foot and try to prevent any  access of that again with knots there is a small this becomes very difficult. 10/10/2021 upon evaluation today patient appears to be doing poorly in regard to his legs he is very swollen and unfortunately I think a big part of the issue here is simply that he is not taking his fluid pills appropriately stressed probably is not helping much at all either. He still having a lot of issues as far as getting moved and having to be out of his current living condition. With that being said he does have of note maggots noted on the right foot this time completely separate from what we have been seeing he tells me that he got his wraps wet again. Its mainly when it rains it sounds like that he goes out to his car in the area  where he has to park there is a area that collects a fairly large puddle according to what he tells me today either way I explained to him which we have done before as well that if he gets his wraps wet he needs to let us know so we can get them changed out. He does not need to keep them on wet. 10/17/2021 upon evaluation today patient appears to be doing well in regard to his legs. In fact things are significantly better compared to where they were previous. Fortunately I see no evidence of infection currently which is great news. No fevers, chills, nausea, vomiting, or diarrhea. Electronic Signature(s) Signed: 10/17/2021 12:48:37 PM By: Lenda Kelp PA-C Entered By: Lenda Kelp on 10/17/2021 12:48:37 -------------------------------------------------------------------------------- Physical Exam Details Patient Name: Date of Service: [name], Brok J. 10/17/2021 10:30 A M Medical Record Number: [id number] Patient Account Number: [id number] Date of Birth/Sex: Treating RN: [birth year] (70 y.o. [name] Primary Care Provider: Nicoletta [name] Other Clinician: Referring Provider: Treating Provider/Extender: [name] in Treatment: 299 Constitutional Well-nourished and well-hydrated in no acute distress. Respiratory normal breathing without difficulty. Psychiatric this patient is able to make decisions and demonstrates good insight into disease process. Alert and Oriented x 3. pleasant and cooperative. Notes Upon inspection patient's wound bed actually showed signs of good granulation and epithelization at this point. I do not see any evidence of active infection locally nor systemically currently which is great news and overall the patient is significantly improved including there being no maggots at this point. Electronic Signature(s) Signed: 10/17/2021 12:48:55 PM By: Lenda Kelp PA-C Entered By: Lenda Kelp on 10/17/2021  12:48:54 -------------------------------------------------------------------------------- Physician Orders Details Patient Name: Date of Service: [name], Llewelyn J. 10/17/2021 10:30 A M Medical Record Number: [id number] Patient Account Number: [id number] Date of Birth/Sex: Treating RN: May 01, 1951 (70 y.o. [name] Primary Care Provider: Nicoletta [name] Other Clinician: Referring Provider: Treating Provider/Extender: [name] in Treatment: [phone] Verbal / Phone Orders: No Diagnosis Coding Follow-up Appointments ppointment in 1 week. Leonard Schwartz Wednesday ***extra time 75 minutes**** Return A Other: - ****double the diuretic daily for the week.*** Bathing/ Shower/ Hygiene May shower with protection but do not get wound dressing(s) wet. Edema Control - Lymphedema / SCD / Other Bilateral Lower Extremities Lymphedema Pumps. Use Lymphedema pumps on leg(s) 2-3 times a day for 45-60 minutes. If wearing any wraps or hose, do not remove them. Continue exercising as instructed. Elevate legs to the level of the heart or above for 30 minutes daily and/or when sitting, a frequency of: - throughout the day Avoid standing for long periods of  time. Exercise regularly Other Edema Control Orders/Instructions: - right leg 4 layer compression with unna boot to secure to upper portion of lower leg. apply lotion to right leg. Wound Treatment Wound #199 - T Second oe Wound Laterality: Right Cleanser: Soap and Water 1 x Per Week Discharge Instructions: May shower and wash wound with dial antibacterial soap and water prior to dressing change. Cleanser: Wound Cleanser 1 x Per Week Discharge Instructions: Cleanse the wound with wound cleanser prior to applying a clean dressing using gauze sponges, not tissue or cotton balls. Peri-Wound Care: Zinc Oxide Ointment 30g tube 1 x Per Week Discharge Instructions: Apply Zinc Oxide to periwound with each dressing change Peri-Wound Care:  Sween Lotion (Moisturizing lotion) 1 x Per Week Discharge Instructions: Apply moisturizing lotion as directed Prim Dressing: KerraCel Ag Gelling Fiber Dressing, 4x5 in (silver alginate) 1 x Per Week ary Discharge Instructions: Apply silver alginate to wound bed as instructed Secured With: Coban Self-Adherent Wrap 4x5 (in/yd) 1 x Per Week Discharge Instructions: Secure with Coban apply lightly as directed. Secured With: Insurance underwriter, Sterile 2x75 (in/in) 1 x Per Week Discharge Instructions: Secure with stretch gauze as directed. Wound #203 - Ankle Wound Laterality: Left, Anterior Cleanser: Soap and Water 1 x Per Week Discharge Instructions: May shower and wash wound with dial antibacterial soap and water prior to dressing change. Cleanser: Wound Cleanser 1 x Per Week Discharge Instructions: Cleanse the wound with wound cleanser prior to applying a clean dressing using gauze sponges, not tissue or cotton balls. Peri-Wound Care: Zinc Oxide Ointment 30g tube 1 x Per Week Discharge Instructions: Apply Zinc Oxide to periwound with each dressing change Peri-Wound Care: Sween Lotion (Moisturizing lotion) 1 x Per Week Discharge Instructions: Apply moisturizing lotion as directed Prim Dressing: KerraCel Ag Gelling Fiber Dressing, 4x5 in (silver alginate) 1 x Per Week ary Discharge Instructions: Apply silver alginate to wound bed as instructed Secondary Dressing: ABD Pad, 8x10 1 x Per Week Discharge Instructions: Apply over primary dressing as directed. Compression Wrap: FourPress (4 layer compression wrap) 1 x Per Week Discharge Instructions: ****UNNA BOOT FIRST LAYER APPLIED TO UPPER PORTION OF LOWER LEG.*** Wound #204 - T Second oe Wound Laterality: Left Cleanser: Soap and Water 1 x Per Week Discharge Instructions: May shower and wash wound with dial antibacterial soap and water prior to dressing change. Cleanser: Wound Cleanser 1 x Per Week Discharge Instructions: Cleanse  the wound with wound cleanser prior to applying a clean dressing using gauze sponges, not tissue or cotton balls. Peri-Wound Care: Zinc Oxide Ointment 30g tube 1 x Per Week Discharge Instructions: Apply Zinc Oxide to periwound with each dressing change Peri-Wound Care: Sween Lotion (Moisturizing lotion) 1 x Per Week Discharge Instructions: Apply moisturizing lotion as directed Prim Dressing: KerraCel Ag Gelling Fiber Dressing, 4x5 in (silver alginate) 1 x Per Week ary Discharge Instructions: Apply silver alginate to wound bed as instructed Secondary Dressing: gauze and zetuvit 1 x Per Week Discharge Instructions: apply gauze between the toes and apply zetuvit pads over the toes secure with the compression wrap to dorsal foot to aid in drainage. Secured With: Coban Self-Adherent Wrap 4x5 (in/yd) 1 x Per Week Discharge Instructions: Secure with Coban apply lightly as directed. Secured With: Insurance underwriter, Sterile 2x75 (in/in) 1 x Per Week Discharge Instructions: Secure with stretch gauze as directed. Wound #208 - T Great oe Wound Laterality: Right Cleanser: Soap and Water 1 x Per Week Discharge Instructions: May shower and wash wound with dial  antibacterial soap and water prior to dressing change. Cleanser: Wound Cleanser 1 x Per Week Discharge Instructions: Cleanse the wound with wound cleanser prior to applying a clean dressing using gauze sponges, not tissue or cotton balls. Peri-Wound Care: Zinc Oxide Ointment 30g tube 1 x Per Week Discharge Instructions: Apply Zinc Oxide to periwound with each dressing change Peri-Wound Care: Sween Lotion (Moisturizing lotion) 1 x Per Week Discharge Instructions: Apply moisturizing lotion as directed Prim Dressing: KerraCel Ag Gelling Fiber Dressing, 4x5 in (silver alginate) 1 x Per Week ary Discharge Instructions: Apply silver alginate to wound bed as instructed Secured With: Coban Self-Adherent Wrap 4x5 (in/yd) 1 x Per  Week Discharge Instructions: Secure with Coban apply lightly as directed. Secured With: Insurance underwriter, Sterile 2x75 (in/in) 1 x Per Week Discharge Instructions: Secure with stretch gauze as directed. Electronic Signature(s) Signed: 10/17/2021 3:33:06 PM By: Lenda Kelp PA-C Signed: 10/17/2021 5:27:53 PM By: Shawn Stall RN, BSN Entered By: Shawn Stall on 10/17/2021 11:06:37 -------------------------------------------------------------------------------- Problem List Details Patient Name: Date of Service: [name], [name] 10/17/2021 10:30 A M Medical Record Number: [id number] Patient Account Number: [id number] Date of Birth/Sex: Treating RN: 09/14/1951 (70 y.o. 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 04/27/2018 No Yes skin L97.829 Non-pressure chronic ulcer of other part of left lower leg with unspecified 03/21/2021 No Yes severity L97.519 Non-pressure chronic ulcer of other part of right foot with unspecified severity 03/21/2021 No Yes L97.812 Non-pressure chronic ulcer of other part of right lower leg with fat layer 08/08/2021 No Yes exposed I87.333 Chronic venous hypertension (idiopathic) with ulcer and inflammation of 01/22/2016 No Yes bilateral lower extremity L03.116 Cellulitis of left lower limb 08/20/2021 No Yes I89.0 Lymphedema, not elsewhere classified 01/22/2016 No Yes E11.40 Type 2 diabetes mellitus with diabetic neuropathy, unspecified 01/22/2016 No Yes E11.622 Type 2 diabetes mellitus with other skin ulcer 01/22/2016 No Yes L03.115 Cellulitis of right lower limb 12/22/2017 No Yes Inactive Problems ICD-10 Code Description Active Date Inactive  Date 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 06/30/2018 06/30/2018 L97.511 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 of skin 06/30/2018 06/30/2018 Resolved Problems ICD-10 Code Description Active Date Resolved Date L97.211 Non-pressure chronic ulcer of right calf limited to breakdown of skin 06/30/2018 06/30/2018 L97.221 Non-pressure chronic ulcer of left calf limited to breakdown of skin 09/30/2016 09/30/2016 L03.116 Cellulitis of left lower limb 04/01/2017 04/01/2017 L97.211 Non-pressure chronic ulcer of right calf limited to breakdown of skin 06/30/2017 06/30/2017 Electronic Signature(s) Signed: 10/17/2021 12:22:38 PM By: Lenda Kelp PA-C Entered By: Lenda Kelp on 10/17/2021 12:22:38 -------------------------------------------------------------------------------- Progress Note Details Patient Name: Date of Service: [name], [name] 10/17/2021 10:30 A M Medical Record Number: [id number] Patient Account Number: [id number] Date of Birth/Sex: Treating RN: 19-Aug-1951 (70 y.o. [name] Primary Care Provider: Nicoletta [name] Other Clinician: Referring Provider: Treating Provider/Extender: [name] in Treatment: 299 Subjective Chief Complaint Information obtained from Patient Patient is here for evaluation of venous/lymphedema ulcers History of Present Illness (HPI) Referred by PCP for consultation. Patient has long standing history of BLE venous stasis, no prior ulcerations. At beginning of month, developed cellulitis and weeping. Received IM Rocephin followed by Keflex and resolved. Wears  compression stocking, appr 6 months old. Not sure strength. No present drainage. 01/22/16 this is a patient who is a type II diabetic on insulin. 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 He tells me he religiously wears pressure stockings of uncertain  strength. He was here with weeping edema about 8 months ago but did not have an open wound. Roughly a month ago he had a reopening on his bilateral legs. He is been using bandages and Neosporin. He does not complain of pain. 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 Last BUN/creatinine I have is from 11/20/15 at 13 and 1.0 respectively 01/29/16; patient arrives today having tolerated the Profore wrap. He brought in his stockings and these are 18 mmHg stockings he bought from [place]. The compression here is likely inadequate. He does not complain of pain or excessive drainage she has no systemic symptoms. The wound on the right looks improved as does the one on the left although one on the left is more substantial with still tissue at risk below the actual wound area on the bilateral posterior calf 02/05/16; patient arrives with poor edema control. He states that we did put a 4 layer compression on it last week. No weight appear 5 this. 02/12/16; the area on the posterior right Has healed. The left Has a substantial wound that has necrotic surface eschar that requires a debridement with a curette. 02/16/16;the patient called or a Nurse visit secondary to increased swelling. He had been in earlier in the week with his right leg healed. He was transitioned to is on pressure stocking on the right leg with the only open wound on the left, a substantial area on the left posterior calf. Note he has a history of severe lower extremity edema, he has a history of chronic atrial fibrillation but not heart failure per my notes but I'll need to research this. He is not complaining of chest pain shortness of breath or orthopnea. The intake nurse noted blisters on the previously closed right leg 02/19/16; this is the patient's regular visit day. I see him on Friday with escalating edema new wounds on the right leg  and clear signs of at least right ventricular heart failure. I increased his Lasix to 40 twice a day. He is returning currently in follow-up. States he is noticed a decrease in that the edema 02/26/16 patient's legs have much less edema. There is nothing really open on the right leg. The left leg has improved condition of the large superficial wound on the posterior left leg 03/04/16; edema control is very much better. The patient's right leg wounds have healed. On the left leg he continues to have severe venous inflammation on the posterior aspect of the left leg. There is no tenderness and I don't think any of this is cellulitis. 03/11/16; patient's right leg is married healed and he is in his own stocking. The patient's left leg has deteriorated somewhat. There is a lot of erythema around the wound on the posterior left leg. There is also a significant rim of erythema posteriorly just above where the wrap would've ended there is a new wound in this location and a lot of tenderness. Can't rule out cellulitis in this area. 03/15/16; patient's right leg remains healed and he is in his own stocking. The patient's left leg is much better than last review. His major  wound on the posterior aspect of his left Is almost fully epithelialized. He has 3 small injuries from the wraps. Really. Erythema seems a lot better on antibiotics 03/18/16; right leg remains healed and he is in his own stocking. The patient's left leg is much better. The area on the posterior aspect of the left calf is fully epithelialized. His 3 small injuries which were wrap injuries on the left are improved only one seems still open his erythema has resolved 03/25/16; patient's right leg remains healed and he is in his own stocking. There is no open area today on the left leg posterior leg is completely closed up. His wrap injuries at the superior aspect of his leg are also resolved. He looks as though he has some irritation on the dorsal ankle  but this is fully epithelialized without evidence of infection. 03/28/16; we discharged this patient on Monday. Transitioned him into his own stocking. There were problems almost immediately with uncontrolled swelling weeping edema multiple some of which have opened. He does not feel systemically unwell in particular no chest pain no shortness of breath and he does not feel 04/08/16; the edema is under better control with the Profore light wrap but he still has pitting edema. There is one large wound anteriorly 2 on the medial aspect of his left leg and 3 small areas on the superior posterior calf. Drainage is not excessive he is tolerating a Profore light well 04/15/16; put a Profore wrap on him last week. This is controlled is edema however he had a lot of pain on his left anterior foot most of his wounds are healed 04/22/16 once again the patient has denuded areas on the left anterior foot which he states are because his wrap slips up word. He saw his primary physician today is on Lasix 40 twice a day and states that he his weight is down 20 pounds over the last 3 months. 04/29/16: Much improved. left anterior foot much improved. He is now on Lasix 80 mg per day. Much improved edema control 05/06/16; I was hoping to be able to discharge him today however once again he has blisters at a low level of where the compression was placed last week mostly on his left lateral but also his left medial leg and a small area on the anterior part of the left foot. 05/09/16; apparently the patient went home after his appointment on 7/4 later in the evening developing pain in his upper medial thigh together with subjective fever and chills although his temperature was not taken. The pain was so intense he felt he would probably have to call 911. However he then remembered that he had leftover doxycycline from a previous round of antibiotics and took these. By the next morning he felt a lot better. He called and spoke to one  of our nurses and I approved doxycycline over the phone thinking that this was in relation to the wounds we had previously seen although they were definitely were not. The patient feels a lot better old fever no chills he is still working. Blood sugars are reasonably controlled 05/13/16; patient is back in for review of his cellulitis on his anterior medial upper thigh. He is taking doxycycline this is a lot better. Culture I did of the nodular area on the dorsal aspect of his foot grew MRSA this also looks a lot better. 05/20/16; the patient is cellulitis on the medial upper thigh has resolved. All of his wound areas including the  left anterior foot, areas on the medial aspect of the left calf and the lateral aspect of the calf at all resolved. He has a new blister on the left dorsal foot at the level of the fourth toe this was excised. No evidence of infection 05/27/16; patient continues to complain weeping edema. He has new blisterlike wounds on the left anterior lateral and posterior lateral calf at the top of his wrap levels. The area on his left anterior foot appears better. He is not complaining of fever, pain or pruritus in his feet. 05/30/16; the patient's blisters on his left anterior leg posterior calf all look improved. He did not increase the Lasix 100 mg as I suggested because he was going to run out of his 40 mg tablets. He is still having weeping edema of his toes 06/03/16; I renewed his Lasix at 80 mg once a day as he was about to run out when I last saw him. He is on 80 mg of Lasix now. I have asked him to cut down on the excessive amount of water he was drinking and asked him to drink according to his thirst mechanisms 06/12/2016 -- was seen 2 days ago and was supposed to wear his compression stockings at home but he is developed lymphedema and superficial blisters on the left lower extremity and hence came in for a review 06/24/16; the remaining wound is on his left anterior leg. He  still has edema coming from between his toes. There is lymphedema here however his edema is generally better than when I last saw this. He has a history of atrial fibrillation but does not have a known history of congestive heart failure nevertheless I think he probably has this at least on a diastolic basis. 07/01/16 I reviewed his echocardiogram from January 2017. This was essentially normal. He did not have LVH, EF of 55-60%. His right ventricular function was normal although he did have trivial tricuspid and pulmonic regurgitation. This is not audible on exam however. I increased his Lasix to do massive edema in his legs well above his knees I think in early July. He was also drinking an excessive amount of water at the time. 07/15/16; missed his appointment last week because of the Labor Day holiday on Monday. He could not get another appointment later in the week. Started to feel the wrap digging in superiorly so we remove the top half and the bottom half of his wrap. He has extensive erythema and blistering superiorly in the left leg. Very tender. Very swollen. Edema in his foot with leaking edema fluid. He has not been systemically unwell 07/22/16; the area on the left leg laterally required some debridement. The medial wounds look more stable. His wrap injury wounds appear to have healed. Edema and his foot is better, weeping edema is also better. He tells me he is meeting with the supplier of the external compression pumps at work 08/05/16; the patient was on vacation last week in [facility]. His wrap is been on for an extended period of time. Also over the weekend he developed an extensive area of tender erythema across his anterior medial thigh. He took to doxycycline yesterday that he had leftover from a previous prescription. The patient complains of weeping edema coming out of his toes 08/08/16; I saw this patient on 10/2. He was tender across his anterior thigh. I put him on doxycycline.  He returns today in follow-up. He does not have any open wounds on his lower  leg, he still has edema weeping into his toes. 08/12/16; patient was seen back urgently today to follow-up for his extensive left thigh cellulitis/erysipelas. He comes back with a lot less swelling and erythema pain is much better. I believe I gave him Augmentin and Cipro. His wrap was cut down as he stated a roll down his legs. He developed blistering above the level of the wrap that remained. He has 2 open blisters and 1 intact. 08/19/16; patient is been doing his primary doctor who is increased his Lasix from 40-80 once a day or 80 already has less edema. Cellulitis has remained improved in the left thigh. 2 open areas on the posterior left calf 08/26/16; he returns today having new open blisters on the anterior part of his left leg. He has his compression pumps but is not yet been shown how to use some vital representative from the supplier. 09/02/16 patient returns today with no open wounds on the left leg. Some maceration in his plantar toes 09/10/2016 -- Dr. Leanord Hawking had recently discharged him on 09/02/2016 and he has come right back with redness swelling and some open ulcers on his left lower extremity. He says this was caused by trying to apply his compression stockings and he's been unable to use this and has not been able to use his lymphedema pumps. He had some doxycycline leftover and he has started on this a few days ago. 09/16/16; there are no open wounds on his leg on the left and no evidence of cellulitis. 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 He does not complain of symptoms here. I am not clear use using his external compression pumps. 09/23/16; I have not seen this patient in 2 weeks. He canceled his appointment 10 days ago as he was going on vacation. He tells me that on Monday he noticed a large area on his posterior left leg which is been  draining copiously and is reopened into a large wound. He is been using ABDs and the external part of his juxtalite, according to our nurse this was not on properly. 10/07/16; Still a substantial area on the posterior left leg. Using silver alginate 10/14/16; in general better although there is still open area which looks healthy. Still using silver alginate. He reminds me that this happen before he left for [facility]. T oday while he was showering in the morning. He had been using his juxtalite's 10/21/16; the area on his posterior left leg is fully epithelialized. However he arrives today with a large area of tender erythema in his medial and posterior left thigh just above the knee. I have marked the area. Once again he is reluctant to consider hospitalization. I treated him with oral antibiotics in the past for a similar situation with resolution I think with doxycycline however this area it seems more extensive to me. He is not complaining of fever but does have chills and says states he is thirsty. His blood sugar today was in the 140s at home 10/25/16 the area on his posterior left leg is fully epithelialized although there is still some weeping edema. The large area of tenderness and erythema in his medial and posterior left thigh is a lot less tender although there is still a lot of swelling in this thigh. He states he feels a lot better. He is on doxycycline and Augmentin that I started last week. This will continued until Tuesday, December 26. I have ordered  a duplex ultrasound of the left thigh rule out DVT whether there is an abscess something that would need to be drained I would also like to know. 11/01/16; he still has weeping edema from a not fully epithelialized area on his left posterior calf. Most of the rest of this looks a lot better. He has completed his antibiotics. His thigh is a lot better. Duplex ultrasound did not show a DVT in the thigh 11/08/16; he comes in today with  more Denuded surface epithelium from the posterior aspect of his calf. There is no real evidence of cellulitis. The superior aspect of his wrap appears to have put quite an indentation in his leg just below the knee and this may have contributed. He does not complain of pain or fever. We have been using silver alginate as the primary dressing. The area of cellulitis in the right thigh has totally resolved. He has been using his compression stockings once a week 11/15/16; the patient arrives today with more loss of epithelium from the posterior aspect of his left calf. He now has a fairly substantial wound in this area. The reason behind this deterioration isn't exactly clear although his edema is not well controlled. He states he feels he is generally more swollen systemically. He is not complaining of chest pain shortness of breath fever. T me he has an appointment with his primary physician in early February. He is on 80 mg of oral ells Lasix a day. He claims compliance with the external compression pumps. He is not having any pain in his legs similar to what he has with his recurrent cellulitis 11/22/16; the patient arrives a follow-up of his large area on his left lateral calf. This looks somewhat better today. He came in earlier in the week for a dressing change since I saw him a week ago. He is not complaining of any pain no shortness of breath no chest pain 11/28/16; the patient arrives for follow-up of his large area on the left lateral calf this does not look better. In fact it is larger weeping edema. The surface of the wound does not look too bad. We have been using silver alginate although I'm not certain that this is a dressing issue. 12/05/16; again the patient follows up for a large wound on the left lateral and left posterior calf this does not look better. There continues to be weeping edema necrotic surface tissue. More worrisome than this once again there is erythema below the wound  involving the distal Achilles and heel suggestive of cellulitis. He is on his feet working most of the day of this is not going well. We are changing his dressing twice a week to facilitate the drainage. 12/12/16; not much change in the overall dimensions of the large area on the left posterior calf. This is very inflamed looking. I gave him an. Doxycycline last week does not really seem to have helped. He found the wrap very painful indeed it seems to of dog into his legs superiorly and perhaps around the heel. He came in early today because the drainage had soaked through his dressings. 12/19/16- patient arrives for follow-up evaluation of his left lower extremity ulcers. He states that he is using his lymphedema pumps once daily when there is "no drainage". He admits to not using his lipedema pumps while under current treatment. His blood sugars have been consistently between 150-200. 12/26/16; the patient is not using his compression pumps at home because of the wetness on  his feet. I've advised him that I think it's important for him to use this daily. He finds his feet too wet, he can put a plastic bag over his legs while he is in the pumps. Otherwise I think will be in a vicious circle. We are using silver alginate to the major area on his left posterior calf 01/02/17; the patient's posterior left leg has further of all into 3 open wounds. All of them covered with a necrotic surface. He claims to be using his compression pumps once a day. His edema control is marginal. Continue with silver alginate 01/10/17; the patient's left posterior leg actually looks somewhat better. There is less edema, less erythema. Still has 3 open areas covered with a necrotic surface requiring debridement. He claims to be using his compression pumps once a day his edema control is better 01/17/17; the patient's left posterior calf look better last week when I saw him and his wrap was changed 2 days ago. He has noted increasing  pain in the left heel and arrives today with much larger wounds extensive erythema extending down into the entire heel area especially tender medially. He is not systemically unwell CBGs have been controlled no fever. Our intake nurse showed me limegreen drainage on his AVD pads. 01/24/17; his usual this patient responds nicely to antibiotics last week giving him Levaquin for presumed Pseudomonas. The whole entire posterior part of his leg is much better much less inflamed and in the case of his Achilles heel area much less tender. He has also had some epithelialization posteriorly there are still open areas here and still draining but overall considerably better 01/31/17- He has continue to tolerate the compression wraps. he states that he continues to use the lymphedema pumps daily, and can increase to twice daily on the weekends. He is voicing no complaints or concerns regarding his LLE ulcers 02/07/17-he is here for follow-up evaluation. He states that he noted some erythema to the left medial and anterior thigh, which he states is new as of yesterday. He is concerned about recurrent cellulitis. He states his blood sugars have been slightly elevated, this morning in the 180s 02/14/17; he is here for follow-up evaluation. When he was last here there was erythema superiorly from his posterior wound in his anterior thigh. He was prescribed Levaquin however a culture of the wound surface grew MRSA over the phone I changed him to doxycycline on Monday and things seem to be a lot better. 02/24/17; patient missed his appointment on Friday therefore we changed his nurse visit into a physician visit today. Still using silver alginate on the large area of the posterior left thigh. He isn't new area on the dorsal left second toe 03/03/17; actually better today although he admits he has not used his external compression pumps in the last 2 days or so because of work responsibilities over the weekend. 03/10/17;  continued improvement. External compression pumps once a day almost all of his wounds have closed on the posterior left calf. Better edema control 03/17/17; in general improved. He still has 3 small open areas on the lateral aspect of his left leg however most of the area on the posterior part of his leg is epithelialized. He has better edema control. He has an ABD pad under his stocking on the right anterior lower leg although he did not let us look at that today. 03/24/17; patient arrives back in clinic today with no open areas however there are areas on the posterior  left calf and anterior left calf that are less than 100% epithelialized. His edema is well controlled in the left lower leg. There is some pitting edema probably lymphedema in the left upper thigh. He uses compression pumps at home once per day. I tried to get him to do this twice a day although he is very reticent. 04/01/2017 -- for the last 2 days he's had significant redness, tenderness and weeping and came in for an urgent visit today. 04/07/17; patient still has 6 more days of doxycycline. He was seen by Dr. Meyer Russel last Wednesday for cellulitis involving the posterior aspect, lateral aspect of his Involving his heel. For the most part he is better there is less erythema and less weeping. He has been on his feet for 12 hours o2 over the weekend. Using his compression pumps once a day 04/14/17 arrives today with continued improvement. Only one area on the posterior left calf that is not fully epithelialized. He has intense bilateral venous inflammation associated with his chronic venous insufficiency disease and secondary lymphedema. We have been using silver alginate to the left posterior calf wound In passing he tells [place] today that the right leg but we have not seen in quite some time has an open area on it but he doesn't want [place] to look at this today states he will show this to [place] next week. 04/21/17; there is no open area on his left  leg although he still reports some weeping edema. He showed [place] his right leg today which is the first time we've seen this leg in a long time. He has a large area of open wound on the right leg anteriorly healthy granulation. Quite a bit of swelling in the right leg and some degree of venous inflammation. He told us about the right leg in passing last week but states that deterioration in the right leg really only happened over the weekend 04/28/17; there is no open area on the left leg although there is an irritated part on the posterior which is like a wrap injury. The wound on the right leg which was new from last week at least to [place] is a lot better. 05/05/17; still no open area on the left leg. Patient is using his new compression stocking which seems to be doing a good job of controlling the edema. He states he is using his compression pumps once per day. The right leg still has an open wound although it is better in terms of surface area. Required debridement. A lot of pain in the posterior right Achilles marked tenderness. Usually this type of presentation this patient gives concern for an active cellulitis 05/12/17; patient arrives today with his major wound from last week on the right lateral leg somewhat better. Still requiring debridement. He was using his compression stocking on the left leg however that is reopened with superficial wounds anteriorly he did not have an open wound on this leg previously. He is still using his juxta light's once daily at night. He cannot find the time to do this in the morning as he has to be at work by 7 AM 05/19/17; right lateral leg wound looks improved. No debridement required. The concerning area is on the left posterior leg which appears to almost have a subcutaneous hemorrhagic component to it. We've been using silver alginate to all the wounds 05/26/17; the right lateral leg wound continues to look improved. However the area on the left posterior calf is a  tightly adherent  surface. [place] using silver alginate. Because of the weeping edema in his legs there is very little good alternatives. 06/02/17; the patient left here last week looking quite good. Major wound on the left posterior calf and a small one on the right lateral calf. Both of these look satisfactory. He tells me that by Wednesday he had noted increased pain in the left leg and drainage. He called on Thursday and Friday to get an appointment here but we were blocked. He did not go to urgent care or his primary physician. He thinks he had a fever on Thursday but did not actually take his temperature. He has not been using his compression pumps on the left leg because of pain. I advised him to go to the emergency room today for IV antibiotics for stents of left leg cellulitis but he has refused I have asked him to take 2 days off work to keep his leg elevated and he has refused this as well. In view of this I'm going to call him and Augmentin and doxycycline. 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 However after a detailed discussion regarding the risk benefits and alternatives he refuses to get admitted to the hospital. With no other recourse I will continue him on oral antibiotics as before and hopefully he'll have his infectious disease consultation this week. 06/16/2017 -- the patient was seen today by the nurse practitioner at infectious disease Ms. Dixon. 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 He has also been advise Lotrimin cream locally. she also had wise IV  antibiotics for his condition if it flares up 06/23/17; patient arrives today with drainage bilaterally although the remaining wound on the left posterior calf after cleaning up today "highlighter yellow drainage" did not look too bad. Unfortunately he has had breakdown on the right anterior leg [previously this leg had not been open and he is using a black stocking] he went to see infectious disease and is been put on clindamycin 150 mg daily, I did not verify the dose although I'm not familiar with using clindamycin in this dosing range, perhaps for prophylaxisoo 06/27/17; I brought this patient back today to follow-up on the wound deterioration on the right lower leg together with surrounding cellulitis. I started him on doxycycline 4 days ago. This area looks better however he comes in today with intense cellulitis on the medial part of his left thigh. This is not have a wound in this area. Extremely tender. We've been using silver alginate to the wounds on the right lower leg left lower leg with bilateral 4 layer compression he is using his external compression pumps once a day 07/04/17; patient's left medial thigh cellulitis looks better. He has not been using his compression pumps as his insert said it was contraindicated with cellulitis. His right leg continues to make improvements all the wounds are still open. We only have one remaining wound on the left posterior calf. Using silver alginate to all open areas. He is on doxycycline which I started a week ago and should be finishing  I gave him Augmentin after Thursday's visit for the severe cellulitis on the left medial thigh which fortunately looks better 07/14/17; the patient's left medial thigh cellulitis has resolved. The cellulitis in his right lower calf on the right also looks better. All of his wounds are stable to improved we've been using silver alginate he has completed the antibiotics I have given him. He has clindamycin 150 mg once a  day prescribed by infectious disease for prophylaxis, I've advised him to start this now. We have been using bilateral Unna boots over silver alginate to the wound areas 07/21/17; the patient is been to see infectious disease who noted his recurrent problems with cellulitis. He was not able to tolerate prophylactic clindamycin therefore he is on amoxicillin 500 twice a day. He also had a second daily dose of Lasix added By Dr. Oneta Rack but he is not taking this. Nor is he being completely compliant with his compression pumps a especially not this week. He has 2 remaining wounds one on the right posterior lateral lower leg and one on the left posterior medial lower leg. 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 The patient has Unna boots bilaterally. 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 He has bilateral Unna boots for compression and silver alginate to his wounds. Arrives today with his legs looking as good as I have seen him in quite some time. Not surprisingly his wounds look better as well with improvement on the right lateral leg venous insufficiency wound and also the left medial leg. He is still using the compression pumps once a day 08/11/17; both legs appear to be doing better wounds on the right lateral and left medial legs look better. Skin on the right leg quite good. He is been using silver alginate as the primary dressing. I'm going to use Anasept gel calcium alginate and maintain all the secondary dressings 08/18/17; the patient continues to actually do quite well. The area on his right lateral leg is just about closed the left medial also looks better although it is still moist in this area. His edema is well controlled we have been using Anasept gel with  calcium alginate and the usual secondary dressings, 4 layer compression and once daily use of his compression pumps "always been able to manage 09/01/17; the patient continues to do reasonably well in spite of his trip to [place]. The area on the right lateral leg is epithelialized. Left is much better but still open. He has more edema and more chronic erythema on the left leg [venous inflammation] 09/08/17; he arrives today with no open wound on the right lateral leg and decently controlled edema. Unfortunately his left leg is not nearly as in his good situation as last week.he apparently had increasing edema starting on Saturday. He edema soaked through into his foot so used a plastic bag to walk around his home. The area on the medial right leg which was his open area is about the same however he has lost surface epithelium on the left lateral which is new and he has significant pain in the Achilles area of the left foot. He is already on amoxicillin chronically for prophylaxis of cellulitis in the left leg 09/15/17; he is completed a week of doxycycline and the cellulitis in  the left posterior leg and Achilles area is as usual improved. He still has a lot of edema and fluid soaking through his dressings. There is no open wound on the right leg. He saw infectious disease NP today 09/22/17;As usual 1 we transition him from our compression wraps to his stockings things did not go well. He has several small open areas on the right leg. He states this was caused by the compression wrap on his skin although he did not wear this with the stockings over them. He has several superficial areas on the left leg medially laterally posteriorly. He does not have any evidence of active cellulitis especially involving the left Achilles The patient is traveling from [facility] Saturday going to [facility]. He states he isn't attempting to get an appointment with a heel objects wound center there to change his  dressings. I am not completely certain whether this will work 10/06/17; the patient came in on Friday for a nurse visit and the nurse reported that his legs actually look quite good. He arrives in clinic today for his regular follow-up visit. He has a new wound on his left third toe over the PIP probably caused by friction with his footwear. He has small areas on the left leg and a very superficial but epithelialized area on the right anterior lateral lower leg. Other than that his legs look as good as I've seen him in quite some time. We have been using silver alginate Review of systems; no chest pain no shortness of breath other than this a 10 point review of systems negative 10/20/17; seen by Dr. Meyer Russel last week. He had taken some antibiotics [doxycycline] that he had left over. Dr. Meyer Russel thought he had candida infection and declined to give him further antibiotics. He has a small wound remaining on the right lateral leg several areas on the left leg including a larger area on the left posterior several left medial and anterior and a small wound on the left lateral. The area on the left dorsal third toe looks a lot better. ROS; Gen.; no fever, respiratory no cough no sputum Cardiac no chest pain other than this 10 point review of system is negative 10/30/17; patient arrives today having fallen in the bathtub 3 days ago. It took him a while to get up. He has pain and maceration in the wounds on his left leg which have deteriorated. He has not been using his pumps he also has some maceration on the right lateral leg. 11/03/17; patient continues to have weeping edema especially in the left leg. This saturates his dressings which were just put on on 12/27. As usual the doxycycline seems to take care of the cellulitis on his lower leg. He is not complaining of fever, chills, or other systemic symptoms. He states his leg feels a lot better on the doxycycline I gave him empirically. He also apparently  gets injections at his primary doctor's officeo Rocephin for cellulitis prophylaxis. I didn't ask him about his compression pump compliance today I think that's probably marginal. Arrives in the clinic with all of his dressings primary and secondary macerated full of fluid and he has bilateral edema 11/10/17; the patient's right leg looks some better although there is still a cluster of wounds on the right lateral. The left leg is inflamed with almost circumferential skin loss medially to laterally although we are still maintaining anteriorly. He does not have overt cellulitis there is a lot of drainage. He is not using  compression pumps. We have been using silver alginate to the wound areas, there are not a lot of options here 11/17/17; the patient's right leg continues to be stable although there is still open wounds, better than last week. The inflammation in the left leg is better. Still loss of surface layer epithelium especially posteriorly. There is no overt cellulitis in the amount of edema and his left leg is really quite good, tells me he is using his compression pumps once a day. 11/24/17; patient's right leg has a small superficial wound laterally this continues to improve. The inflammation in the left leg is still improving however we have continuous surface layer epithelial loss posteriorly. There is no overt cellulitis in the amount of edema in both legs is really quite good. He states he is using his compression pumps on the left leg once a day for 5 out of 7 days 12/01/17; very small superficial areas on the right lateral leg continue to improve. Edema control in both legs is better today. He has continued loss of surface epithelialization and left posterior calf although I think this is better. We have been using silver alginate with large number of absorptive secondary dressings 4 layer on the left Unna boot on the right at his request. He tells me he is using his compression pumps once a  day 12/08/17; he has no open area on the right leg is edema control is good here. ooOn the left leg however he has marked erythema and tenderness breakdown of skin. He has what appears to be a wrap injury just distal to the popliteal fossa. 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 There was swelling erythema and breakdown in the right leg. He has much worse skin breakdown in the left leg as well multiple open areas medially and posteriorly as well as laterally. He tells me he has been using his compression pumps but tells me he feels that the drainage out of his leg is worse when he uses a compression pumps. T be fair to him he is been saying this o for a while however I don't know that I have really been listening to this. I wonder if the compression pumps are working properly 12/22/17;. Once again he arrives with severe erythema, weeping edema from the left greater than right leg. Noncompliance with compression pumps. New this visit he is complaining of pain on the lateral aspect of the right leg and the medial aspect of his right thigh. He apparently saw his cardiologist Dr. [name] who was ordered an echocardiogram area and I think this is a step in the right direction 12/25/17; started his doxycycline Monday night. There is still intense erythema of the right leg especially in the anterior thigh although there is less tenderness. The erythema around the wound on the right lateral calf also is less tender. He still complaining of pain in the left heel. His wounds are about the same right lateral left medial left lateral. Superficial but certainly not close to closure. He denies being systemically unwell no fever chills no  abdominal pain no diarrhea 12/29/17; back in follow-up of his extensive right calf and right thigh cellulitis. I added amoxicillin to cover possible doxycycline resistant strep. This  seems to of done the trick he is in much less pain there is much less erythema and swelling. He has his echocardiogram at 11:00 this morning. X-ray of the left heel was also negative. 01/05/18; the patient arrived with his edema under much better control. Now that he is retired he is able to use his compression pumps daily and sometimes twice a day per the patient. He has a wound on the right leg the lateral wound looks better. Area on the left leg also looks a lot better. He has no evidence of cellulitis in his bilateral thighs I had a quick peak at his echocardiogram. He is in normal ejection fraction and normal left ventricular function. He has moderate pulmonary hypertension moderately reduced right ventricular function. One would have to wonder about chronic sleep apnea although he says he doesn't snore. He'll review the echocardiogram with his cardiologist. 01/12/18; the patient arrives with the edema in both legs under exemplary control. He is using his compression pumps daily and sometimes twice daily. His wound on the right lateral leg is just about closed. He still has some weeping areas on the posterior left calf and lateral left calf although everything is just about closed here as well. I have spoken with Aldean Baker who is the patient's nurse practitioner and infectious disease. She was concerned that the patient had not understood that the parenteral penicillin injections he was receiving for cellulitis prophylaxis was actually benefiting him. I don't think the patient actually saw that I would tend to agree we were certainly dealing with less infections although he had a serious one last month. 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 He is healed. He'll be placed in  juxtalite compression wraps and increase his lymphedema pumps to twice daily. We will follow up again next week to ensure there are no issues with the new regiment. 01/20/18-he is here for evaluation of bilateral lower extremity weeping edema. Yesterday he was placed in compression wrap to the right lower extremity and compression stocking to left lower shrubbery. He states he uses lymphedema pumps last night and again this morning and noted a blister to the left lower extremity. On exam he was noted to have drainage to the right lower extremity. He will be placed in Unna boots bilaterally and follow-up next week 01/26/18; patient was actually discharged a week ago to his own juxta light stockings only to return the next day with bilateral lower extremity weeping edema.he was placed in bilateral Unna boots. He arrives today with pain in the back of his left leg. There is no open area on the right leg however there is a linear/wrap injury on the left leg and weeping edema on the left leg posteriorly. I spoke with infectious disease about 10 days ago. They were disappointed that the patient elected to discontinue prophylactic intramuscular penicillin shots as they felt it was particularly beneficial in reducing the frequency of his cellulitis. I discussed this with the patient today. He does not share this view. He'll definitely need antibiotics today. Finally he is traveling to [place] and trauma leaving this Saturday and returning a week later and he does not travel with his pumps. He is going by car 01/30/18; patient was seen 4 days ago and brought back in today for review of cellulitis in the left leg posteriorly. I put him on amoxicillin this really hasn't helped as much as I might like. He is also worried because he is traveling  to [facility] trauma by car. Finally we will be rewrapping him. There is no open area on the right leg over his left leg has multiple weeping areas as usual 02/09/18; The same  wrap on for 10 days. He did not pick up the last doxycycline I prescribed for him. He apparently took 4 days worth he already had. There is nothing open on his right leg and the edema control is really quite good. He's had damage in the left leg medially and laterally especially probably related to the prolonged use of Unna boots 02/12/18; the patient arrived in clinic today for a nurse visit/wrap change. He complained of a lot of pain in the left posterior calf. He is taking doxycycline that I previously prescribed for him. Unfortunately even though he used his stockings and apparently used to compression pumps twice a day he has weeping edema coming out of the lateral part of his right leg. This is coming from the lower anterior lateral skin area. 02/16/18; the patient has finished his doxycycline and will finish the amoxicillin 2 days. The area of cellulitis in the left calf posteriorly has resolved. He is no longer having any pain. He tells me he is using his compression pumps at least once a day sometimes twice. 02/23/18; the patient finished his doxycycline and Amoxil last week. On Friday he noticed a small erythematous circle about the size of a quarter on the left lower leg just above his ankle. This rapidly expanded and he now has erythema on the lateral and posterior part of the thigh. This is bright red. Also has an area on the dorsal foot just above his toes and a tender area just below the left popliteal fossa. He came off his prophylactic penicillin injections at his own insistence one or 2 months ago. This is obviously deteriorated since then 03/02/18; patient is on doxycycline and Amoxil. Culture I did last week of the weeping area on the back of his left calf grew group B strep. I have therefore renewed the amoxicillin 500 3 times a day for a further week. He has not been systemically unwell. Still complaining of an area of discomfort right under his left popliteal fossa. There is no open  wound on the right leg. He tells me that he is using his pumps twice a day on most days 03/09/18; patient arrives in clinic today completing his amoxicillin today. The cellulitis on his left leg is better. Furthermore he tells me that he had intramuscular penicillin shots that his primary care office today. However he also states that the wrap on his right leg fell down shortly after leaving clinic last week. He developed a large blister that was present when he came in for a nurse visit later in the week and then he developed intense discomfort around this area.He tells me he is using his compression pumps 03/16/18; the patient has completed his doxycycline. The infectious part of this/cellulitis in the left heel area left popliteal area is a lot better. He has 2 open areas on the right calf. Still areas on the left calf but this is a lot better as well. 03/24/18; the patient arrives complaining of pain in the left popliteal area again. He thinks some of this is wrap injury. He has no open area on the right leg and really no open area on the left calf either except for the popliteal area. He claims to be compliant with the compression pumps 03/31/18; I gave him doxycycline last week  because of cellulitis in the left popliteal area. This is a lot better although the surface epithelium is denuded off and response to this. He arrives today with uncontrolled edema in the right calf area as well as a fingernail injury in the right lateral calf. There is only a few open areas on the left 04/06/18; I gave him amoxicillin doxycycline over the last 2 weeks that the amoxicillin should be completing currently. He is not complaining of any pain or systemic symptoms. The only open areas see has is on the right lateral lower leg paradoxically I cannot see anything on the left lower leg. He tells me he is using his compression pumps twice a day on most days. Silver alginate to the wounds that are open under 4 layer  compression 04/13/18; he completed antibiotics and has no new complaints. Using his compression pumps. Silver alginate that anything that's opened 04/20/18; he is using his compression pumps religiously. Silver alginate 4 layer compression anything that's opened. He comes in today with no open wounds on the left leg but 3 on the right including a new one posteriorly. He has 2 on the right lateral and one on the right posterior. He likes Unna boots on the right leg for reasons that aren't really clear we had the usual 4 layer compression on the left. It may be necessary to move to the 4 layer compression on the right however for now I left them in the Unna boots 04/27/18; he is using his compression pumps at least once a day. He has still the wounds on the right lateral calf. The area right posteriorly has closed. He does not have an open wound on the left under 4 layer compression however on the dorsal left foot just proximal to the toes and the left third toe 2 small open areas were identified 05/11/18; he has not uses compression pumps. The areas on the right lateral calf have coalesced into one large wound necrotic surface. On the left side he has one small wound anteriorly however the edema is now weeping out of a large part of his left leg. He says he wasn't using his pumps because of the weeping fluid. I explained to him that this is the time he needs to pump more 05/18/18; patient states he is using his compression pumps twice a day. The area on the right lateral large wound albeit superficial. On the left side he has innumerable number of small new wounds on the left calf particularly laterally but several anteriorly and medially. All these appear to have healthy granulated base these look like the remnants of blisters however they occurred under compression. The patient arrives in clinic today with his legs somewhat better. There is certainly less edema, less multiple open areas on the left calf  and the right anterior leg looks somewhat better as well superficial and a little smaller. However he relates pain and erythema over the last 3-4 days in the thigh and I looked at this today. He has not been systemically unwell no fever no chills no change in blood sugar values 05/25/18; comes in today in a better state. The severe cellulitis on his left leg seems better with the Keflex. Not as tender. He has not been systemically unwell ooHard to find an open wound on the left lower leg using his compression pumps twice a day ooThe confluent wounds on his right lateral calf somewhat better looking. These will ultimately need debridement I didn't do this today. 06/01/18;  the severe cellulitis on the left anterior thigh has resolved and he is completed his Keflex. ooThere is no open wound on the left leg however there is a superficial excoriation at the base of the third toe dorsally. Skin on the bottom of his left foot is macerated looking. ooThe left the wounds on the lateral right leg actually looks some better although he did require debridement of the top half of this wound area with an open curet 06/09/18 on evaluation today patient appears to be doing poorly in regard to his right lower extremity in particular this appears to likely be infected he has very thick purulent discharge along with a bright green tent to the discharge. This makes me concerned about the possibility of pseudomonas. He's also having increased discomfort at this point on evaluation. Fortunately there does not appear to be any evidence of infection spreading to the other location at this time. 06/16/18 on evaluation today patient appears to actually be doing fairly well. His ulcer has actually diminished in size quite significantly at this point which is good news. Nonetheless he still does have some evidence of infection he did see infectious disease this morning before coming here for his appointment. I did review the  results of their evaluation and their note today. They did actually have him discontinue the Cipro and initiate treatment with linezolid at this time. He is doing this for the next seven days and they recommended a follow-up in four months with them. He is the keep a log of the need for intermittent antibiotic therapy between now and when he falls back up with infectious disease. This will help them gaze what exactly they need to do to try and help them out. 06/23/18; the patient arrives today with no open wounds on the left leg and left third toe healed. He is been using his compression pumps twice a day. On the right lateral leg he still has a sizable wound but this is a lot better than last time I saw this. 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 Has been using silver alginate under 4 layer compression 06/30/18; the only open wound he has is on the right lateral leg and this looks healthy. No debridement is required. We have been using silver alginate. He does not have an open wound on the left leg. There is apparently some drainage from the dorsal proximal third toe on the left although I see no open wound here. 07/03/18 on evaluation today patient was actually here just for a nurse visit rapid change. However when he was here on Wednesday for his rat change due to having been healed on the left and then developing blisters we initiated the wrap again knowing that he would be back today for [place] to reevaluate and see were at. Unfortunately he has developed some cellulitis into the proximal portion of his right lower extremity even into the region of his thigh. 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 He was placed on one as what at that point. Nonetheless he is done with that and has been tolerating it well otherwise. Doxycycline which in the past really  did not seem to be effective for him. Nonetheless I think the best option may be for [place] to definitely reinitiate the antibiotics for a longer period of time. 07/07/18; since I last  saw this patient a week ago he has had a difficult time. At that point he did not have an open wound on his left leg. We transitioned him into juxta light stockings. He was apparently in the clinic the next day with blisters on the left lateral and left medial lower calf. He also had weeping edema fluid. He was put back into a compression wrap. He was also in the clinic on Friday with intense erythema in his right thigh. Per the patient he was started on Bactrim however that didn't work at all in terms of relieving his pain and swelling. He has taken 3 doxycycline that he had left over from last time and that seems to of helped. He has blistering on the right thigh as well. 07/14/18; the erythema on his right thigh has gotten better with doxycycline that he is finishing. 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 Presumably this cellulitis in the thigh was not related to that although I think this is a bit concerning going forward. He still has an area on the right lateral calf the blister on the right medial calf just below the knee that was discussed above. On the left 2 small open areas left medial and left lateral. Edema control is adequate. He is using his compression pumps twice a day 07/20/18; continued improvement in the condition of both legs especially the edema in his bilateral thighs. He tells me he is been losing weight through a combination of diet and exercise. He is using his compression pumps twice a day. So overall she made to the remaining wounds 07/27/2018; continued improvement in condition of both legs. His edema is well controlled. The area on the right lateral leg is just about closed he had one blisters show up on the medial left  upper calf. We have him in 4 layer compression. He is going on a 10-day trip to [place], [place] and [place]. He will be driving. He wants to wear Unna boots because of the lessening amount of constriction. He will not use compression pumps while he is away 08/05/18 on evaluation today patient actually appears to be doing decently well all things considered in regard to his bilateral lower extremities. The worst ulcer is actually only posterior aspect of his left lower extremity with a four layer compression wrap cut into his leg a couple weeks back. He did have a trip and actually had [employer] for the trip that he is worn since he was last here. Nonetheless he feels like the [employer] actually do better for him his swelling is up a little bit but he also with his trip was not taking his Lasix on a regular set schedule like he was supposed to be. He states that obviously the reason being that he cannot drive and keep going without having to urinate too frequently which makes it difficult. He did not have his pumps with him while he was away either which I think also maybe playing a role here too. 08/13/2018; the patient only has a small open wound on the right lateral calf which is a big improvement in the last month or 2. He also has the area posteriorly just below the posterior fossa on the left which I think was a wrap injury from several weeks ago. He has no current evidence of cellulitis. He tells me he is back into his  compression pumps twice a day. He also tells me that while he was at the laundromat somebody stole a section of his extremitease stockings 08/20/2018; back in the clinic with a much improved state. He only has small areas on the right lateral mid calf which is just about healed. This was is more substantial area for quite a prolonged period of time. He has a small open area on the left anterior tibia. The area on the posterior calf just below the popliteal fossa  is closed today. He is using his compression pumps twice a day 08/28/2018; patient has no open wound on the right leg. He has a smattering of open areas on the calf with some weeping lymphedema. More problematically than that it looks as though his wraps of slipped down in his usual he has very angry upper area of edema just below the right medial knee and on the right lateral calf. He has no open area on his feet. The patient is traveling to [facility] next week. I will send him in an antibiotic. We will continue to wrap the right leg. We ordered extremitease stockings for him last week and I plan to transition the right leg to a stocking when he gets home which will be in 10 days time. As usual he is very reluctant to take his pumps with him when he travels 09/07/2018; patient returns from [facility]. He shows me a picture of his left leg in the mid part of his trip last week with intense fire engine erythema. The picture look bad enough I would have considered sending him to the hospital. Instead he went to the wound care center in [facility]. They did not prescribe him antibiotics but he did take some doxycycline he had leftover from a previous visit. I had given him trimethoprim sulfamethoxazole before he left this did not work according to the patient. This is resulted in some improvement fortunately. He comes back with a large wound on the left posterior calf. Smaller area on the left anterior tibia. Denuded blisters on the dorsal left foot over his toes. Does not have much in the way of wounds on the right leg although he does have a very tender area on the right posterior area just below the popliteal fossa also suggestive of infection. He promises me he is back on his pumps twice a day 09/15/2018; the intense cellulitis in his left lower calf is a lot better. The wound area on the posterior left calf is also so better. However he has reasonably extensive wounds on the dorsal  aspect of his second and third toes and the proximal foot just at the base of the toes. There is nothing open on the right leg 09/22/2018; the patient has excellent edema control in his legs bilaterally. He is using his external compression pumps twice a day. He has no open area on the right leg and only the areas in the left foot dorsally second and third toe area on the left side. He does not have any signs of active cellulitis. 10/06/2018; the patient has good edema control bilaterally. He has no open wound on the right leg. There is a blister in the posterior aspect of his left calf that we had to deal with today. He is using his compression pumps twice a day. There is no signs of active cellulitis. We have been using silver alginate to the wound areas. He still has vulnerable areas on the base of his left  first second toes dorsally He has a his extremities stockings and we are going to transition him today into the stocking on the right leg. He is cautioned that he will need to continue to use the compression pumps twice a day. If he notices uncontrolled edema in the right leg he may need to go to 3 times a day. 10/13/2018; the patient came in for a nurse check on Friday he has a large flaccid blister on the right medial calf just below the knee. We unroofed this. He has this and a new area underneath the posterior mid calf which was undoubtedly a blister as well. He also has several small areas on the right which is the area we put his extremities stocking on. 10/19/2018; the patient went to see infectious disease this morning I am not sure if that was a routine follow-up in any case the doxycycline I had given him was discontinued and started on linezolid. He has not started this. It is easy to look at his left calf and the inflammation and think this is cellulitis however he is very tender in the tissue just below the popliteal fossa and I have no doubt that there is infection going on here. He  states the problem he is having is that with the compression pumps the edema goes down and then starts walking the wrap falls down. We will see if we can adhere this. He has 1 or 2 minuscule open areas on the right still areas that are weeping on the posterior left calf, the base of his left second and third toes 10/26/18; back today in clinic with quite of skin breakdown in his left anterior leg. This may have been infection the area below the popliteal fossa seems a lot better however tremendous epithelial loss on the left anterior mid tibia area over quite inexpensive tissue. He has 2 blisters on the right side but no other open wound here. 10/29/2018; came in urgently to see [place] today and we worked him in for review. He states that the 4 layer compression on the right leg caused pain he had to cut it down to roughly his mid calf this caused swelling above the wrap and he has blisters and skin breakdown today. As a result of the pain he has not been using his pumps. Both legs are a lot more edematous and there is a lot of weeping fluid. 11/02/18; arrives in clinic with continued difficulties in the right leg> left. Leg is swollen and painful. multiple skin blisters and new open areas especially laterally. He has not been using his pumps on the right leg. 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 He is not systemically unwell. 11/09/2018; the patient claims he is being compliant with his pumps. He is finished the doxycycline I gave him last week. Culture I did of the wound on the right lateral leg showed a few very resistant methicillin staph aureus. This was resistant to doxycycline. Nevertheless he states the pain in the leg is a lot better which makes me wonder if the cultured organism was not really what was causing the problem nevertheless this is a very dangerous organism to be culturing out of any wound. His right leg is still a lot larger than the  left. He is using an [employer] broadcast assistant on this area, he blames a 4-layer compression for causing the original skin breakdown which I doubt is true however I cannot talk him out  of it. We have been using silver alginate to all of these areas which were initially blisters 11/16/2018; patient is being compliant with his external compression pumps at twice a day. Miraculously he arrives in clinic today with absolutely no open wounds. He has better edema control on the left where he has been using 4 layer compression versus wound of wounds on the right and I pointed this out to him. There is no inflammation in the skin in his lower legs which is also somewhat unusual for him. There is no open wounds on the dorsal left foot. He has extremitease stockings at home and I have asked him to bring these in next week. 11/25/18 patient's lower extremity on examination today on the left appears for the most part to be wound free. He does have an open wound on the lateral aspect of the right lower extremity but this is minimal compared to what I've seen in past. He does request that we go ahead and wrap the left leg as well even though there's nothing open just so hopefully it will not reopen in short order. 1/28; patient has superficial open wounds on the right lateral calf left anterior calf and left posterior calf. His edema control is adequate. 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 We have been using silver alginate as the primary dressing. He claims compliance with his compression pumps 2/4; patient has superficial open wounds on numerous areas of his left calf and again one on the left dorsal foot. The areas on the right lateral calf have healed. The cellulitis that I gave him doxycycline for last week is also resolved this was mostly on the left anterior calf just below the tibial tuberosity. His edema looks fairly well-controlled.  He tells me he went to see his primary doctor today and had blood work ordered 2/11; once again he has several open areas on the left calf left tibial area. Most of these are small and appear to have healthy granulation. He does not have anything open on the right. The edema and control in his thighs is pretty good which is usually a good indication he has been using his pumps as requested. 2/18; he continues to have several small areas on the left calf and left tibial area. Most of these are small healthy granulation. We put him in his stocking on the right leg last week and he arrives with a superficial open area over the right upper tibia and a fairly large area on the right lateral tibia in similar condition. His edema control actually does not look too bad, he claims to be using his compression pumps twice a day 2/25. Continued small areas on the left calf and left tibial area. New areas especially on the right are identified just below the tibial tuberosity and on the right upper tibia itself. There are also areas of weeping edema fluid even without an obvious wound. He does not have a considerable degree of lymphedema but clearly there is more edema here than his skin can handle. He states he is using the pumps twice a day. We have an Unna boot on the right and 4 layer compression on the left. 3/3; he continues to have an area on the right lateral calf and right posterior calf just below the popliteal fossa. There is a fair amount of tenderness around the wound on the popliteal fossa but I did  not see any evidence of cellulitis, could just be that the wrap came down and rubbed in this area. ooHe does not have an open area on the left leg however there is an area on the left dorsal foot at the base of the third toe ooWe have been using silver alginate to all wound areas 3/10; he did not have an open area on his left leg last time he was here a week ago. T oday he arrives with a horizontal wound  just below the tibial tuberosity and an area on the left lateral calf. He has intense erythema and tenderness in this area. The area is on the right lateral calf and right posterior calf better than last week. We have been using silver alginate as usual 3/18 - Patient returns with 3 small open areas on left calf, and 1 small open area on right calf, the skin looks ok with no significant erythema, he continues the UNA boot on right and 4 layer compression on left. The right lateral calf wound is closed , the right posterior is small area. we will continue silver alginate to the areas. 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 He is been tolerating the dressing changes without complication. Fortunately she has made excellent improvement in regard to the overall status of his wounds. Unfortunately every time we cease wrapping him he ends up reopening in causing more significant issues at that point. Again I'm unsure of the best direction to take although I think the lymphedema clinic may be appropriate for him. 02/03/19 on evaluation today patient appears to be doing well in regard to the wounds that we saw him for last week unfortunately he has a new area on the proximal portion of his right medial/posterior lower extremity where the wrap somewhat slowed down and caused swelling and a blister to rub and open. Unfortunately this is the only opening that he has on either leg at this point. 02/17/19 on evaluation today patient's bilateral lower extremities appear to be doing well. He still completely healed in regard to the left lower extremity. In regard to the right lower extremity the area where the wrap and slid down and caused the blister still seems to be slightly open although this is dramatically better  than during the last evaluation two weeks ago. I'm very pleased with the way this stands overall. 03/03/19 on evaluation today patient appears to be doing well in regard to his right lower extremity in general although he did have a new blister open this does not appear to be showing any evidence of active infection at this time. Fortunately there's No fevers, chills, nausea, or vomiting noted at this time. Overall I feel like he is making good progress it does feel like that the right leg will we perform the [employer] seems to do with a bit better than three layer wrap on the left which slid down on him. We may switch to doing bilateral in the book wraps. 5/4; I have not seen Mr. Ferch in quite some time. According to our case manager he did not have an open wound on his left leg last week. He had 1 remaining wound on the right posterior medial calf. He arrives today with multiple openings on the left leg probably were blisters and/or wrap injuries  from [employer]. I do not think the Unna boot's will provide adequate compression on the left. I am also not clear about the frequency he is using the compression pumps. 03/17/19 on evaluation today patient appears to be doing excellent in regard to his lower extremities compared to last week's evaluation apparently. He had gotten significantly worse last week which is unfortunate. The [employer] wrap on the left did not seem to do very well for him at all and in fact it didn't control his swelling significantly enough he had an additional outbreak. Subsequently we go back to the four layer compression wrap on the left. This is good news. At least in that he is doing better and the wound seem to be killing him. He still has not heard anything from the lymphedema clinic. 03/24/19 on evaluation today patient actually appears to be doing much better in regard to his bilateral lower Trinity as compared to last week when I saw him. Fortunately there's no signs of  active infection at this time. He has been tolerating the dressing changes without complication. Overall I'm extremely pleased with the progress and appearance in general. 04/07/19 on evaluation today patient appears to be doing well in regard to his bilateral lower extremities. His swelling is significantly down from where it was previous. With that being said he does have a couple blisters still open at this point but fortunately nothing that seems to be too severe and again the majority of the larger openings has healed at this time. 04/14/19 on evaluation today patient actually appears to be doing quite well in regard to his bilateral lower extremities in fact I'm not even sure there's anything significantly open at this time at any site. Nonetheless he did have some trouble with these wraps where they are somewhat irritating him secondary to the fact that he has noted that the graph wasn't too close down to the end of this foot in a little bit short as well up to his knee. Otherwise things seem to be doing quite well. 04/21/19 upon evaluation today patient's wound bed actually showed evidence of being completely healed in regard to both lower extremities which is excellent news. There does not appear to be any signs of active infection which is also good news. I'm very pleased in this regard. No fevers, chills, nausea, or vomiting noted at this time. 04/28/19 on evaluation today patient appears to be doing a little bit worse in regard to both lower extremities on the left mainly due to the fact that when he went infection disease the wrap was not wrapped quite high enough he developed a blister above this. On the right he is a small open area of nothing too significant but again this is continuing to give him some trouble he has been were in the Velcro compression that he has at home. 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 He's been  tolerating the dressing changes without complication. Fortunately there's no signs of active infection at this time. No fevers, chills, nausea, or vomiting noted at this time. We have been trying to get an appointment with her lymphedema clinic in [facility] but unfortunately nobody can get them on phone with not been able to even fax information over the patient likewise is not been able to get in touch with them. Overall I'm not sure exactly what's going on here with to reach out again today. 05/12/19 on  evaluation today patient actually appears to be doing about the same in regard to his bilateral lower Trinity ulcers. Still having a lot of drainage unfortunately. He tells me especially in the left but even on the right. There's no signs of active infection which is good news we've been using so ratcheted up to this point. 05/19/19 on evaluation today patient actually appears to be doing quite well with regard to his left lower extremity which is great news. Fortunately in regard to the right lower extremity has an issues with his wrap and he subsequently did remove this from what I'm understanding. Nonetheless long story short is what he had rewrapped once he removed it subsequently had maggots underneath this wrap whenever he came in for evaluation today. With that being said they were obviously completely cleaned away by the nursing staff. The visit today which is excellent news. However he does appear to potentially have some infection around the right ankle region where the maggots were located as well. He will likely require anabiotic therapy today. 05/26/19 on evaluation today patient actually appears to be doing much better in regard to his bilateral lower extremities. I feel like the infection is under much better control. With that being said there were maggots noted when the wrap was removed yet again today. Again this could have potentially been left over from previous  although at this time there does not appear to be any signs of significant drainage there was obviously on the wrap some drainage as well this contracted gnats or otherwise. Either way I do not see anything that appears to be doing worse in my pinion and in fact I think his drainage has slowed down quite significantly likely mainly due to the fact to his infection being under better control. 06/02/2019 on evaluation today patient actually appears to be doing well with regard to his bilateral lower extremities there is no signs of active infection at this time which is great news. With that being said he does have several open areas more so on the right than the left but nonetheless these are all significantly better than previously noted. 06/09/2019 on evaluation today patient actually appears to be doing well. His wrap stayed up and he did not cause any problems he had more drainage on the right compared to the left but overall I do not see any major issues at this time which is great news. 06/16/2019 on evaluation today patient appears to be doing excellent with regard to his lower extremities the only area that is open is a new blister that can have opened as of today on the medial ankle on the left. Other than this he really seems to be doing great I see no major issues at this point. 06/23/2019 on evaluation today patient appears to be doing quite well with regard to his bilateral lower extremities. In fact he actually appears to be almost completely healed there is a small area of weeping noted of the right lower extremity just above the ankle. Nonetheless fortunately there is no signs of active infection at this time which is good news. No fevers, chills, nausea, vomiting, or diarrhea. 8/24; the patient arrived for a nurse visit today but complained of very significant pain in the left leg and therefore I was asked to look at this. Noted that he did not have an open area on the left leg last week  nevertheless this was wrapped. The patient states that he is not been able to put  his compression pumps on the left leg because of the discomfort. He has not been systemically unwell 06/30/2019 on evaluation today patient unfortunately despite being excellent last week is doing much worse with regard to his left lower extremity today. In fact he had to come in for a nurse on Monday where his left leg had to be rewrapped due to excessive weeping Dr. Leanord Hawking placed him on doxycycline at that point. Fortunately there is no signs of active infection Systemically at this time which is good news. 07/07/2019 in regard to the patient's wounds today he actually seems to be doing well with his right lower extremity there really is nothing open or draining at this point this is great news. Unfortunately the left lower extremity is given him additional trouble at this time. There does not appear to be any signs of active infection nonetheless he does have a lot of edema and swelling noted at this point as well as blistering all of which has led to a much more poor appearing leg at this time compared to where it was 2 weeks ago when it was almost completely healed. Obviously this is a little discouraging for the patient. He is try to contact the lymphedema clinic in [place] he has not been able to get through to them. 07/14/2019 on evaluation today patient actually appears to be doing slightly better with regard to his left lower extremity ulcers. Overall I do feel like at least at the top of the wrap that we have been placing this area has healed quite nicely and looks much better. The remainder of the leg is showing signs of improvement. Unfortunately in the thigh area he still has an open region on the left and again on the right he has been utilizing just a Band-Aid on an area that also opened on the thigh. Again this is an area that were not able to wrap although we did do an Ace wrap to provide some compression  that something that obviously is a little less effective than the compression wraps we have been using on the lower portion of the leg. He does have an appointment with the lymphedema clinic in [facility] on Friday. 07/21/2019 on evaluation today patient appears to be doing better with regard to his lower extremity ulcers. He has been tolerating the dressing changes without complication. Fortunately there is no signs of active infection at this time. No fevers, chills, nausea, vomiting, or diarrhea. I did receive the paperwork from the physical therapist at the lymphedema clinic in [place]. Subsequently I signed off on that this morning and sent that back to him for further progression with the treatment plan. 07/28/2019 on evaluation today patient appears to be doing very well with regard to his right lower extremity where I do not see any open wounds at this point. Fortunately he is feeling great as far as that is concerned as well. In regard to the left lower extremity he has been having issues with still several areas of weeping and edema although the upper leg is doing better his lower leg still I think is going require the compression wrap at this time. No fevers, chills, nausea, vomiting, or diarrhea. 08/04/2019 on evaluation today patient unfortunately is having new wounds on the right lower extremity. Again we have been using Unna boot wrap on that side. We switched him to using his juxta lite wrap at home. With that being said he tells me he has been using it although his legs extremely  swollen and to be honest really does not appear that he has been. I cannot know that for sure however. Nonetheless he has multiple new wounds on the right lower extremity at this time. Obviously we will have to see about getting this rewrapped for him today. 08/11/2019 on evaluation today patient appears to be doing fairly well with regard to his wounds. He has been tolerating the dressing changes  including the compression wraps without complication. He still has a lot of edema in his upper thigh regions bilaterally he is supposed to be seeing the lymphedema clinic on the 15th of this month once his wraps arrive for the upper part of his legs. 08/18/2019 on evaluation today patient appears to be doing well with regard to his bilateral lower extremities at this point. He has been tolerating the dressing changes without complication. Fortunately there is no signs of active infection which is also good news. He does have a couple weeping areas on the first and second toe of the right foot he also has just a small area on the left foot upper leg and a small area on the left lower leg but overall he is doing quite well in my opinion. He is supposed to be getting his wraps shortly in fact tomorrow and then subsequently is seeing the lymphedema clinic next Wednesday on the 21st. Of note he is also leaving on the 25th to go on vacation for a week to the beach. For that reason and since there is some uncertainty about what there can be doing at lymphedema clinic next Wednesday I am get a make an appointment for next Friday here for [place] to see what we need to do for him prior to him leaving for vacation. 10/23; patient arrives in considerable pain predominantly in the upper posterior calf just distal to the popliteal fossa also in the wound anteriorly above the major wound. This is probably cellulitis and he has had this recurrently in the past. He has no open wound on the right side and he has had an Radio broadcast assistant in that area. Finally I note that he has an area on the left posterior calf which by enlarge is mostly epithelialized. This protrudes beyond the borders of the surrounding skin in the setting of dry scaly skin and lymphedema. The patient is leaving for [facility] on Sunday. Per his longstanding pattern, he will not take his compression pumps with him predominantly out of fear that they will be  stolen. He therefore asked that we put a Unna boot back on the right leg. He will also contact the wound care center in [facility] to see if they can change his dressing in the mid week. 11/3; patient returned from his vacation to [facility]. He was seen on 1 occasion at their wound care center. They did a 2 layer compression system as they did not have our 4-layer wrap. I am not completely certain what they put on the wounds. They did not change the Unna boot on the right. The patient is also seeing a lymphedema specialist physical therapist in [place]. It appears that he has some compression sleeve for his thighs which indeed look quite a bit better than I am used to seeing. He pumps over these with his external compression pumps. 11/10; the patient has a new wound on the right medial thigh otherwise there is no open areas on the right. He has an area on the left leg posteriorly anteriorly and medially and an  area over the left second toe. We have been using silver alginate. He thinks the injury on his thigh is secondary to friction from the compression sleeve he has. 11/17; the patient has a new wound on the right medial thigh last week. He thinks this is because he did not have a underlying stocking for his thigh juxta lite apparatus. He now has this. The area is fairly large and somewhat angry but I do not think he has underlying cellulitis. ooHe has a intact blister on the right anterior tibial area. ooSmall wound on the right great toe dorsally ooSmall area on the medial left calf. 11/30; the patient does not have any open areas on his right leg and we did not take his juxta lite stocking off. However he states that on Friday his compression wrap fell down lodging around his upper mid calf area. As usual this creates a lot of problems for him. He called urgently today to be seen for a nurse visit however the nurse visit turned into a provider visit because of extreme erythema and  pain in the left anterior tibia extending laterally and posteriorly. The area that is problematic is extensive 10/06/2019 upon evaluation today patient actually appears to be doing poorly in regard to his left lower extremity. He Dr. Leanord Hawking did place him on doxycycline this past Monday apparently due to the fact that he was doing much worse in regard to this left leg. Fortunately the doxycycline does seem to be helping. Unfortunately we are still having a very difficult time getting his edema under any type of control in order to anticipate discharge at some point. The only way were really able to control his lymphedema really is with compression wraps and that has only even seemingly temporary. He has been seeing a lymphedema clinic they are trying to help in this regard but still this has been somewhat frustrating in general for the patient. 10/13/19 on evaluation today patient appears to be doing excellent with regard to his right lower extremity as far as the wounds are concerned. His swelling is still quite extensive unfortunately. He is still having a lot of drainage from the thigh areas bilaterally which is unfortunate. He's been going to lymphedema clinic but again he still really does not have this edema under control as far as his lower extremities are concern. With regard to his left lower extremity this seems to be improving and I do believe the doxycycline has been of benefit for him. He is about to complete the doxycycline. 10/20/2019 on evaluation today patient appears to be doing poorly in regard to his bilateral lower extremities. More in the right thigh he has a lot of irritation at this site unfortunately. In regard to the left lower extremity the wrap was not quite as high it appears and does seem to have caused him some trouble as well. Fortunately there is no evidence of systemic infection though he does have some blue-green drainage which has me concerned for the possibility  of Pseudomonas. He tells me he is previously taking Cipro without complications and he really does not care for Levaquin however due to some of the side effects he has. He is not allergic to any medications specifically antibiotics that were aware of. 10/27/2019 on evaluation today patient actually does appear to be for the most part doing better when compared to last week's evaluation. With that being said he still has multiple open wounds over the bilateral lower extremities. He actually forgot to  start taking the Cipro and states that he still has the whole bottle. He does have several new blisters on left lower extremity today I think I would recommend he go ahead and take the Cipro based on what I am seeing at this point. 12/30-Patient comes at 1 week visit, 4 layer compression wraps on the left and Unna boot on the right, primary dressing Xtrasorb and silver alginate. Patient is taking his Cipro and has a few more days left probably 5-6, and the legs are doing better. He states he is using his compressions devices which I believe he has 11/10/2019 on evaluation today patient actually appears to be much better than last time I saw him 2 weeks ago. His wounds are significantly improved and overall I am very pleased in this regard. Fortunately there is no signs of active infection at this time. He is just a couple of days away from completing Cipro. Overall his edema is much better he has been using his lymphedema pumps which I think is also helping at this point. 11/17/2019 on evaluation today patient appears to be doing excellent in regard to his wounds in general. His legs are swollen but not nearly as much as they have been in the past. Fortunately he is tolerating the compression wraps without complication. No fevers, chills, nausea, vomiting, or diarrhea. He does have some erythema however in the distal portion of his right lower extremity specifically around the forefoot and toes there is a  little bit of warmth here as well. 11/24/2019 on evaluation today patient appears to be doing well with regard to his right lower extremity I really do not see any open wounds at this point. His left lower extremity does have several open areas and his right medial thigh also is open. Other than this however overall the patient seems to be making good progress and I am very pleased at this point. 12/01/2019 on evaluation today patient appears to be doing poorly at this point in regard to his left lower extremity has several new blisters despite the fact that we have him in compression wraps. In fact he had a 4-layer compression wrap, his upper thigh wrapped from lymphedema clinic, and a juxta light over top of the 4 layer compression wrap the lymphedema clinic applied and despite all this he still develop blisters underneath. Obviously this does have me concerned about the fact that unfortunately despite what we are doing to try to get wounds healed he continues to have new areas arise I do not think he is ever good to be at the point where he can realistically just use wraps at home to keep things under control. Typically when we heal him it takes about 1-2 days before he is back in the clinic with severe breakdown and blistering of his lower extremities bilaterally. This is happened numerous times in the past. Unfortunately I think that we may need some help as far as overall fluid overload to kind of limit what we are seeing and get things under better control. 12/08/2019 on evaluation today patient presents for follow-up concerning his ongoing bilateral lower extremity edema. Unfortunately he is still having quite a bit of swelling the compression wraps are controlling this to some degree but he did see Dr. [name] his cardiologist I do have that available for review today as far as the appointment was concerned that was on 12/06/2019. Obviously that she has been 2 days ago. The patient states that he is  only been  taking the Lasix 80 mg 1 time a day he had told me previously he was taking this twice a day. Nonetheless Dr. [name] recommended this be up to 80 mg 2 times a day for the patient as he did appear to be fluid overloaded. With that being said the patient states he did this yesterday and he was unable to go anywhere or do anything due to the fact that he was constantly having to urinate. Nonetheless I think that this is still good to be something that is important for him as far as trying to get his edema under control at all things that he is going to be able to just expect his wounds to get under control and things to be better without going through at least a period of time where he is trying to stabilize his fluid management in general and I think increasing the Lasix is likely the first step here. It was also mentioned the possibility that the patient may require metolazone. With that being said he wanted to have the patient take Lasix twice a day first and then reevaluating 2 months to see where things stand. 12/15/2019 upon evaluation today patient appears to be doing regard to his legs although his toes are showing some signs of weeping especially on the left at this point to some degree on the right. There does not appear to be any signs of active infection and overall I do feel like the compression wraps are doing well for him but he has not been able to take the Lasix at home and the increased dose that Dr. [name] recommended. He tells me that just not go to be feasible for him. Nonetheless I think in this case he should probably send a message to Dr. [name] in order to discuss options from the standpoint of possible admission to get the fluid off or otherwise going forward. 12/22/2019 upon evaluation today patient appears to be doing fairly well with regard to his lower extremities at this point. In fact he would be doing excellent if it was not for the fact that his right anterior thigh  apparently had an allergic reaction to adhesive tape that he used. The wound itself that we have been monitoring actually appears to be healed. There is a lot of irritation at this point. 12/29/2019 upon evaluation today patient appears to be doing well in regard to his lower extremities. His left medial thigh is open and somewhat draining today but this is the only region that is open the right has done much better with the treatment utilizing the steroid cream that I prescribed for him last week. Overall I am pleased in that regard. Fortunately there is no signs of active infection at this time. No fevers, chills, nausea, vomiting, or diarrhea. 01/05/2020 upon evaluation today patient appears to be doing more poorly in regard to his right lower extremity at this point upon evaluation today. Unfortunately he continues to have issues in this regard and I think the biggest issue is controlling his edema. This obviously is not very well controlled at this point is been recommended that he use the Lasix twice a day but he has not been able to do that. Unfortunately I think this is leading to an issue where honestly he is not really able to effectively control his edema and therefore the wounds really are not doing significantly better. I do not think that he is going to be able to keep things under good control unless  he is able to control his edema much better. I discussed this again in great detail with him today. 01/12/2020 good news is patient actually appears to be doing quite well today at this point. He does have an appointment with lymphedema clinic tomorrow. His legs appear healed and the toe on the left is almost completely healed. In general I am very pleased with how things stand at this point. 01/19/2020 upon evaluation today patient appears to actually be doing well in regard to his lower extremities there is nothing open at this point. Fortunately he has done extremely well more recently. Has  been seeing lymphedema clinic as well. With that being said he has Velcro wraps for his lower legs as well as his upper legs. The only wound really is on his toe which is the right great toe and this is barely anything even there. With all that being said I think it is good to be appropriate today to go ahead and switch him over to the Velcro compression wraps. 01/26/2020 upon evaluation today patient appears to be doing worse with regard to his lower extremities after last week switch him to Velcro compression wraps. Unfortunately he lasted less than 24 hours he did not have the sock portion of his Velcro wrap on the left leg and subsequently developed a blister underneath the Velcro portion. Obviously this is not good and not what we were looking for at this point. He states the lymphedema clinic did tell him to wear the wrap for 23 hours and take him off for 1 I am okay with that plan but again right now we got a get things back under control again he may have some cellulitis noted as well. 02/02/2020 upon evaluation today patient unfortunately appears to have several areas of blistering on his bilateral lower extremities today mainly on the feet. His legs do seem to be doing somewhat better which is good news. Fortunately there is no evidence of active infection at this time. No fevers, chills, nausea, vomiting, or diarrhea. 02/16/2020 upon evaluation today patient appears to be doing well at this time with regard to his legs. He has a couple weeping areas on his toes but for the most part everything is doing better and does appear to be sealed up on his legs which is excellent news. We can continue with wrapping him at this point as he had every time we discontinue the wraps he just breaks out with new wounds. There is really no point in is going forward with this at this point. 03/08/2020 upon evaluation today patient actually appears to be doing quite well with regard to his lower extremity ulcers.  He has just a very superficial and really almost nonexistent blister on the left lower extremity he has in general done very well with the compression wraps. With that being said I do not see any signs of infection at this time which is good news. 03/29/2020 upon evaluation today patient appears to be doing well with regard to his wounds currently except for where he had several new areas that opened up due to some of the wrap slipping and causing him trouble. He states he did not realize they had slipped. Nonetheless he has a 1 area on the right and 3 new areas on the left. Fortunately there is no signs of active infection at this time which is great news. 04/05/2020 upon evaluation today patient actually appears to be doing quite well in general in regard to his  legs currently. Fortunately there is no signs of active infection at this time. No fevers, chills, nausea, vomiting, or diarrhea. He tells me next week that he will actually be seen in the lymphedema clinic on Thursday at 10 AM I see him on Wednesday next week. 04/12/2020 upon evaluation today patient appears to be doing very well with regard to his lower extremities bilaterally. In fact he does not appear to have any open wounds at this point which is good news. Fortunately there is no signs of active infection at this time. No fevers, chills, nausea, vomiting, or diarrhea. 04/19/2020 upon evaluation today patient appears to be doing well with regard to his wounds currently on the bilateral lower extremities. There does not appear to be any signs of active infection at this time. Fortunately there is no evidence of systemic infection and overall very pleased at this point. Nonetheless after I held him out last week he literally had blisters the next morning already which swelled up with him being right back here in the clinic. Overall I think that he is just not can be able to be discharged with his legs the way they are he is much to volume  overloaded as far as fluid is concerned and that was discussed with him today of also discussed this but should try the clinic nurse manager as well as Dr. Leanord Hawking. 04/26/2020 upon evaluation today patient appears to be doing better with regard to his wounds currently. He is making some progress and overall swelling is under good control with the compression wraps. Fortunately there is no evidence of active infection at this time. 05/10/2020 on evaluation today patient appears to be doing overall well in regard to his lower extremities bilaterally. He is Tolerating the compression wraps without complication and with what we are seeing currently I feel like that he is making excellent progress. There is no signs of active infection at this time. 05/24/2020 upon evaluation today patient appears to be doing well in regard to his legs. The swelling is actually quite a bit down compared to where it has been in the past. Fortunately there is no sign of active infection at this time which is also good news. With that being said he does have several wounds on his toes that have opened up at this point. 05/31/2020 upon evaluation today patient appears to be doing well with regard to his legs bilaterally where he really has no significant fluid buildup at this point overall he seems to be doing quite well. Very pleased in this regard. With regard to his toes these also seem to be drying up which is excellent. We have continue to wrap him as every time we tried as a transition to the juxta light wraps things just do not seem to get any better. 06/07/2020 upon evaluation today patient appears to be doing well with regard to his right leg at this point. Unfortunately left leg has a lot of blistering he tells me the wrap started to slide down on him when he tried to put his other Velcro wrap over top of it to help keep things in order but nonetheless still had some issues. 06/14/2020 on evaluation today patient appears to be  doing well with regard to his lower extremity ulcers and foot ulcers at this point. I feel like everything is actually showing signs of improvement which is great news overall there is no signs of active infection at this time. No fevers, chills, nausea, vomiting, or diarrhea.  06/21/2020 on evaluation today patient actually appears to be doing okay in regard to his wounds in general. With that being said the biggest issue I see is on his right foot in particular the first and second toe seem to be doing a little worse due to the fact this is staying very wet. I think he is probably getting need to change out his dressings a couple times in between each week when we see him in regard to his toes in order to keep this drier based on the location and how this is proceeding. 06/28/2020 on evaluation today patient appears to be doing a little bit more poorly overall in regard to the appearance of the skin I am actually somewhat concerned about the possibility of him having a little bit of an infection here. We discussed the course of potentially giving him a doxycycline prescription which he is taken previously with good result. With that being said I do believe that this is potentially mild and at this point easily fixed. I just do not want anything to get any worse. 07/12/2020 upon evaluation today patient actually appears to be making some progress with regard to his legs which is great news there does not appear to be any evidence of active infection. Overall very pleased with where things stand. 07/26/2020 upon evaluation today patient appears to be doing well with regard to his leg ulcers and toe ulcers at this point. He has been tolerating the compression wraps without complication overall very pleased in this regard. 08/09/2020 upon evaluation today patient appears to be doing well with regard to his lower extremities bilaterally. Fortunately there is no signs of active infection overall I am pleased  with where things stand. 08/23/2020 on evaluation today patient appears to be doing well with regard to his wound. He has been tolerating the dressing changes without complication. Fortunately there is no signs of active infection at this time. Overall his legs seem to be doing quite well which is great news and I am very pleased in that regard. No fevers, chills, nausea, vomiting, or diarrhea. 09/13/2020 upon evaluation today patient appears to be doing okay in regard to his lower extremities. He does have a fairly large blister on the right leg which I did remove the blister tissue from today so we can get this to dry out other than that however he seems to be doing quite well. There is no signs of active infection at this time. 09/27/2020 upon evaluation today patient appears to actually be doing some better in regard to his right leg. Fortunately signs of active infection at this time which is great news. No fevers, chills, nausea, vomiting, or diarrhea. 10/04/2020 upon evaluation today patient actually appears to be showing signs of improvement which is great news with regard to his leg ulcers. Fortunately there is no signs of active infection which is great news he is still taking the antibiotics currently. No fevers, chills, nausea, vomiting, or diarrhea. 10/18/2020 on evaluation today patient appears to be doing well with regard to his legs currently. He has been tolerating the dressing changes including the wraps without complication. Fortunately there is no signs of active infection at this time. No fevers, chills, nausea, vomiting, or diarrhea. 10/25/2020 upon evaluation today patient appears to be doing decently well in regard to his wounds currently. He has been tolerating the dressing changes without complication. Overall I feel like he is making good progress albeit slow. Again this is something we can have  to continue to wrap for some time to come most likely. 11/08/2020 upon evaluation  today patient appears to be doing well with regard to his wounds currently. He has been tolerating the dressing changes without complication is not currently on any antibiotics and he does not appear to show any signs of infection. He does continue to have a lot of drainage on the right leg not too severe but nonetheless this is very scattered. On the left leg this is looking to be much improved overall. 11/15/2020 upon evaluation today patient appears to be doing better with regard to his legs bilaterally. Especially the right leg which was much more significant last week. There does not appear to be any signs of active infection which is great news. No fevers, chills, nausea, vomiting, or diarrhea. 11/23/2019 upon evaluation today patient appears to be doing poorly still in regard to his lower extremities bilaterally. Unfortunately his right leg in particular appears to be doing much more poorly there is no signs really of infection this is not warm to touch but he does have a lot of drainage and weeping unfortunately. With that reason I do believe that we may need to initiate some treatment here to try to help calm down some of the swelling of the right leg. I think switching to a 4-layer compression wrap would be beneficial here. The patient is in agreement with giving this a try. 11/29/2020 upon evaluation today patient appears to be doing well currently in regard to his leg ulcers. I feel like the right leg is doing better he still has a lot of drainage but we do see some improvement here. The 4-layer compression wrap I think was helpful. 12/06/2020 upon evaluation today patient appears to be doing well with regard to his legs. In fact they seem to be doing about the best I have seen up to this point. Fortunately there is no signs of active infection at this time. No fevers, chills, nausea, vomiting, or diarrhea. 12/20/2020 upon evaluation today patient appears to be doing well at this time in regard to  his legs. He is not having any significant draining which is great news. Fortunately there is no signs of active infection at this time. No fevers, chills, nausea, vomiting, or diarrhea. 01/17/2021 upon evaluation today Akil actually appears to be doing excellent in regard to his legs. He has a few areas again that come and go as far as his toes are concerned but overall this is doing quite well. 01/31/2021 upon evaluation today patient appears to be doing well with regard to his legs. Fortunately there does not appear to be any signs of active infection which is great news. Overall he is still having significant edema despite the compression wraps basically the 4-layer compression wrap to just keep things under control there is really not much room for play. 4/13: Mr. Briseno is a longstanding patient in our clinic and benefits greatly from weekly compression wraps. Today he has no complaints. He has been tolerating the wraps well. He states he is using the lymphedema pumps at home. 5/4; patient presents for follow-up of his chronic lymphedema/venous insufficiency ulcers. He comes weekly for compression wraps. He has no complaints today. He was unable to tolerate the Coflex 2 layer Last week so we will do the four press 4-layer compression. He has been using his lymphedema pumps daily. 5/18; patient presents for 2-week follow-up. He has no complaints or issues today. He has developed a new wound to the  right foot on his fourth toe. He overall feels well and denies signs of infection. 6/1; patient presents for 2-week follow-up. He has no complaints or issues today. He denies signs of infection. 04/18/2021 upon evaluation today patient appears to be doing well with regard to his legs bilaterally. Family open wound is actually on the toe of his left foot everything else is completely closed which is great news. In general I am extremely pleased with where things stand at this point. The patient is also  happy that things are doing so well. 05/02/2021 upon evaluation today patient's legs actually appear to be doing quite well today. Fortunately there does not appear to be any signs of active infection which is great and overall I am extremely pleased with where he stands today. The patient does not appear to have any evidence of active infection at this time which is also great news. 05/09/2021 upon evaluation today patient appears to be doing a little bit more poorly in regard to his legs. Unfortunately he is having issues with some breakdown and a blood blister on the left leg this is due to I believe honestly to how it was wrapped last week. Fortunately there does not appear to be any signs of infection but nonetheless this is still a concern to be honest. No fevers, chills, nausea, vomiting, or diarrhea. 05/16/2021 upon evaluation today patient appears to be doing significantly better as compared to last week. I am very pleased with where things stand today. There does not appear to be any signs of infection which is great news and overall very pleased with where we stand. No fevers, chills, nausea, vomiting, or diarrhea. 05/30/2021 upon evaluation today patient appears to be doing well with regard to his legs. He has been tolerating the dressing changes without complication. Fortunately there does not appear to be any signs of active infection which is great news and overall I am extremely pleased with where things stand today. No fevers, chills, nausea, vomiting, or diarrhea. 06/20/2021 upon evaluation today patient actually appears to be making good progress today and very pleased with what we are seeing. I think his legs are really maintaining. As long as we continue wrapping he seems to be doing excellent in my opinion. Fortunately there is no signs of active infection at this time. No fevers, chills, nausea, vomiting, or diarrhea. 07/11/2021 upon evaluation today patient actually appears to be  making excellent progress at this time. Fortunately there does not appear to be any evidence of active infection which is great news and overall I am extremely pleased with where things stand today. No fevers, chills, nausea, vomiting, or diarrhea. 07/25/2021 upon evaluation today patient appears to be doing well currently in regard to his lower extremities. He has been making good progress here and I do not see anything that is actually open significantly today this is great news. No fevers, chills, nausea, vomiting, or diarrhea. 08/08/2021 upon evaluation today patient appears to be doing well with regard to his wound. He has been tolerating the dressing changes without complication. With that being said unfortunately has a new area that opened up as far as his right posterior leg is concerned this was a blister he also has an area on the third toe right foot which also reopen. Fortunately there is no signs of active infection at this time which is great news. No fevers, chills, nausea, vomiting, or diarrhea. 10/17; patient came in today at his request initially for a nurse visit  because it but out of concern for deterioration in both his lower legs and cellulitis I was asked to look at him. He comes in with increased swelling which he says started over the weekend he started to notice pain as well in his left medial ankle, right knee, left knee left dorsal foot. His wraps fell down contributing to some of this but he has not been using his compression pumps over the weekend for reasons that are not really clear. He comes in with multiple new wounds including the right posterior leg, right third toe, right fourth toe, left second toe left medial ankle left dorsal ankle and right anterior lower leg 09/19/2021 upon evaluation today patient does seem to be making improvements in general which is great news. I do not see any evidence of infection currently he does have some hypergranulation of the  anterior portion of his ankle on the left side this is going require some debridement to pare this down and then subsequently silver nitrate probably due to the amount of bleeding that he is probably going to experience. He is in agreement with this plan however. 09/26/2021 upon evaluation today although the patient's legs appear to be doing okay today unfortunately he did have maggots noted during the evaluation as well. This is again quite unfortunate with the respect to the fact that he feels like that he got the wrap wet which was noted today he is not sure when and I think this is what led to the issue. Nonetheless we can try to see what we can do about silly things off so that this does not happen again in the future although he is can have to be diligent about taking care of his wraps as well. 10/03/2021 upon evaluation today patient appears to be doing okay in regard to his legs currently. He unfortunately has maggots again noted on the left foot. This obviously is becoming quite an issue to be perfectly honest. I do think that he needs to see what can be done at home to try to limit this exposure. Last week we had cleaned everything away so this is a new infestation as far as that is concerned. There is really not much that I can do to combat that I am trying to do what we can here in the clinic to wrap his foot and try to prevent any access of that again with knots there is a small this becomes very difficult. 10/10/2021 upon evaluation today patient appears to be doing poorly in regard to his legs he is very swollen and unfortunately I think a big part of the issue here is simply that he is not taking his fluid pills appropriately stressed probably is not helping much at all either. He still having a lot of issues as far as getting moved and having to be out of his current living condition. With that being said he does have of note maggots noted on the right foot this time completely separate  from what we have been seeing he tells me that he got his wraps wet again. Its mainly when it rains it sounds like that he goes out to his car in the area where he has to park there is a area that collects a fairly large puddle according to what he tells me today either way I explained to him which we have done before as well that if he gets his wraps wet he needs to let us know so we can get  them changed out. He does not need to keep them on wet. 10/17/2021 upon evaluation today patient appears to be doing well in regard to his legs. In fact things are significantly better compared to where they were previous. Fortunately I see no evidence of infection currently which is great news. No fevers, chills, nausea, vomiting, or diarrhea. Objective Constitutional Well-nourished and well-hydrated in no acute distress. Vitals Time Taken: 10:38 AM, Height: 70 in, Weight: 380.2 lbs, BMI: 54.5, Temperature: 98 F, Pulse: 80 bpm, Respiratory Rate: 22 breaths/min, Blood Pressure: 151/65 mmHg. Respiratory normal breathing without difficulty. Psychiatric this patient is able to make decisions and demonstrates good insight into disease process. Alert and Oriented x 3. pleasant and cooperative. General Notes: Upon inspection patient's wound bed actually showed signs of good granulation and epithelization at this point. I do not see any evidence of active infection locally nor systemically currently which is great news and overall the patient is significantly improved including there being no maggots at this point. Integumentary (Hair, Skin) Wound #199 status is Open. Original cause of wound was Gradually Appeared. The date acquired was: 08/08/2021. The wound has been in treatment 10 weeks. The wound is located on the Right T Second. The wound measures 2.2cm length x 1.4cm width x 0.1cm depth; 2.419cm^2 area and 0.242cm^3 volume. There oe is Fat Layer (Subcutaneous Tissue) exposed. There is no tunneling or  undermining noted. There is a medium amount of serous drainage noted. The wound margin is distinct with the outline attached to the wound base. There is large (67-100%) red, pink granulation within the wound bed. There is no necrotic tissue within the wound bed. Wound #203 status is Open. Original cause of wound was Gradually Appeared. The date acquired was: 08/20/2021. The wound has been in treatment 8 weeks. The wound is located on the Left,Anterior Ankle. The wound measures 0.4cm length x 0.9cm width x 0.1cm depth; 0.283cm^2 area and 0.028cm^3 volume. There is Fat Layer (Subcutaneous Tissue) exposed. There is no tunneling or undermining noted. There is a medium amount of serosanguineous drainage noted. The wound margin is distinct with the outline attached to the wound base. There is large (67-100%) red, pink granulation within the wound bed. There is no necrotic tissue within the wound bed. Wound #204 status is Open. Original cause of wound was Gradually Appeared. The date acquired was: 09/26/2021. The wound has been in treatment 3 weeks. The wound is located on the Left T Second. The wound measures 3cm length x 3cm width x 0.1cm depth; 7.069cm^2 area and 0.707cm^3 volume. There is oe Fat Layer (Subcutaneous Tissue) exposed. There is no tunneling or undermining noted. There is a medium amount of serosanguineous drainage noted. The wound margin is flat and intact. There is large (67-100%) red granulation within the wound bed. There is no necrotic tissue within the wound bed. Wound #207 status is Healed - Epithelialized. Original cause of wound was Gradually Appeared. The date acquired was: 10/03/2021. The wound has been in treatment 2 weeks. The wound is located on the Left T Third. The wound measures 0cm length x 0cm width x 0cm depth; 0cm^2 area and 0cm^3 volume. oe There is a medium amount of serosanguineous drainage noted. Wound #208 status is Open. Original cause of wound was Gradually  Appeared. The date acquired was: 10/10/2021. The wound has been in treatment 1 weeks. The wound is located on the Right T Great. The wound measures 0.6cm length x 0.7cm width x 0.1cm depth; 0.33cm^2 area and 0.033cm^3  volume. There is oe Fat Layer (Subcutaneous Tissue) exposed. There is no tunneling or undermining noted. There is a medium amount of serosanguineous drainage noted. The wound margin is distinct with the outline attached to the wound base. There is large (67-100%) red, pink granulation within the wound bed. There is no necrotic tissue within the wound bed. Wound #209 status is Healed - Epithelialized. Original cause of wound was Gradually Appeared. The date acquired was: 10/10/2021. The wound has been in treatment 1 weeks. The wound is located on the Right,Dorsal Foot. The wound measures 0cm length x 0cm width x 0cm depth; 0cm^2 area and 0cm^3 volume. There is a medium amount of serosanguineous drainage noted. 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 There was a Four Layer Compression Therapy Procedure by Shawn Stall, RN. Post procedure Diagnosis Wound #203: Same as Pre-Procedure There was a Four Layer Compression Therapy Procedure by Shawn Stall, RN. Post procedure Diagnosis Wound #: Same as Pre-Procedure Plan Follow-up Appointments: Return Appointment in 1 week. Leonard Schwartz Wednesday ***extra time 75 minutes**** Other: - ****double the diuretic daily for the week.*** Bathing/ Shower/ Hygiene: May shower with protection but do not get wound dressing(s) wet. Edema Control - Lymphedema / SCD / Other: Lymphedema Pumps. Use Lymphedema pumps on leg(s) 2-3 times a day for 45-60 minutes. If wearing any wraps or hose, do not remove them. Continue exercising as instructed. Elevate legs to the level of the heart or above for 30 minutes daily and/or when sitting, a frequency of: - throughout the day Avoid standing for long periods of time. Exercise regularly Other Edema Control Orders/Instructions: - right leg 4 layer compression with unna boot to secure to upper portion of lower leg. apply lotion to right leg. WOUND #199: - T Second Wound Laterality: Right oe Cleanser: Soap and Water 1 x Per Week/ Discharge Instructions: May shower and wash wound with dial antibacterial soap and water prior to dressing change. Cleanser: Wound Cleanser 1 x Per Week/ Discharge Instructions: Cleanse the wound with wound cleanser prior to applying a clean dressing using gauze sponges, not tissue or cotton balls. Peri-Wound Care: Zinc Oxide Ointment 30g tube 1 x Per Week/ Discharge Instructions: Apply Zinc Oxide to periwound with each dressing change Peri-Wound Care: Sween Lotion (Moisturizing lotion) 1 x Per Week/ Discharge Instructions: Apply moisturizing lotion as directed Prim Dressing: KerraCel Ag Gelling Fiber Dressing, 4x5 in (silver alginate) 1 x Per Week/ ary Discharge Instructions: Apply silver alginate to wound bed as instructed Secured With: Coban Self-Adherent Wrap 4x5 (in/yd) 1 x Per Week/ Discharge Instructions: Secure with Coban apply lightly as directed. Secured With: Insurance underwriter, Sterile 2x75 (in/in) 1 x Per  Week/ Discharge Instructions: Secure with stretch gauze as directed. WOUND #203: - Ankle Wound Laterality: Left, Anterior Cleanser: Soap and Water 1 x Per Week/ Discharge Instructions: May shower and wash  wound with dial antibacterial soap and water prior to dressing change. Cleanser: Wound Cleanser 1 x Per Week/ Discharge Instructions: Cleanse the wound with wound cleanser prior to applying a clean dressing using gauze sponges, not tissue or cotton balls. Peri-Wound Care: Zinc Oxide Ointment 30g tube 1 x Per Week/ Discharge Instructions: Apply Zinc Oxide to periwound with each dressing change Peri-Wound Care: Sween Lotion (Moisturizing lotion) 1 x Per Week/ Discharge Instructions: Apply moisturizing lotion as directed Prim Dressing: KerraCel Ag Gelling Fiber Dressing, 4x5 in (silver alginate) 1 x Per Week/ ary Discharge Instructions: Apply silver alginate to wound bed as instructed Secondary Dressing: ABD Pad, 8x10 1 x Per Week/ Discharge Instructions: Apply over primary dressing as directed. Com pression Wrap: FourPress (4 layer compression wrap) 1 x Per Week/ Discharge Instructions: ****UNNA BOOT FIRST LAYER APPLIED TO UPPER PORTION OF LOWER LEG.*** WOUND #204: - T Second Wound Laterality: Left oe Cleanser: Soap and Water 1 x Per Week/ Discharge Instructions: May shower and wash wound with dial antibacterial soap and water prior to dressing change. Cleanser: Wound Cleanser 1 x Per Week/ Discharge Instructions: Cleanse the wound with wound cleanser prior to applying a clean dressing using gauze sponges, not tissue or cotton balls. Peri-Wound Care: Zinc Oxide Ointment 30g tube 1 x Per Week/ Discharge Instructions: Apply Zinc Oxide to periwound with each dressing change Peri-Wound Care: Sween Lotion (Moisturizing lotion) 1 x Per Week/ Discharge Instructions: Apply moisturizing lotion as directed Prim Dressing: KerraCel Ag Gelling Fiber Dressing, 4x5 in (silver alginate) 1 x Per  Week/ ary Discharge Instructions: Apply silver alginate to wound bed as instructed Secondary Dressing: gauze and zetuvit 1 x Per Week/ Discharge Instructions: apply gauze between the toes and apply zetuvit pads over the toes secure with the compression wrap to dorsal foot to aid in drainage. Secured With: Coban Self-Adherent Wrap 4x5 (in/yd) 1 x Per Week/ Discharge Instructions: Secure with Coban apply lightly as directed. Secured With: Insurance underwriter, Sterile 2x75 (in/in) 1 x Per Week/ Discharge Instructions: Secure with stretch gauze as directed. WOUND #208: - T Great Wound Laterality: Right oe Cleanser: Soap and Water 1 x Per Week/ Discharge Instructions: May shower and wash wound with dial antibacterial soap and water prior to dressing change. Cleanser: Wound Cleanser 1 x Per Week/ Discharge Instructions: Cleanse the wound with wound cleanser prior to applying a clean dressing using gauze sponges, not tissue or cotton balls. Peri-Wound Care: Zinc Oxide Ointment 30g tube 1 x Per Week/ Discharge Instructions: Apply Zinc Oxide to periwound with each dressing change Peri-Wound Care: Sween Lotion (Moisturizing lotion) 1 x Per Week/ Discharge Instructions: Apply moisturizing lotion as directed Prim Dressing: KerraCel Ag Gelling Fiber Dressing, 4x5 in (silver alginate) 1 x Per Week/ ary Discharge Instructions: Apply silver alginate to wound bed as instructed Secured With: Coban Self-Adherent Wrap 4x5 (in/yd) 1 x Per Week/ Discharge Instructions: Secure with Coban apply lightly as directed. Secured With: Insurance underwriter, Sterile 2x75 (in/in) 1 x Per Week/ Discharge Instructions: Secure with stretch gauze as directed. 1. Would recommend currently that we go ahead and continue with the measures as before I think doubling up on his Lasix like he supposed to be taking was actually very good for him. This seems to have improved things significantly. 2. Also can  recommend that we have the patient go ahead and continue with the 4-layer compression wraps using alginate to any open areas that is doing awesome. We will see patient back for reevaluation in 1  week here in the clinic. If anything worsens or changes patient will contact our office for additional recommendations. Electronic Signature(s) Signed: 10/17/2021 12:49:19 PM By: Lenda Kelp PA-C Entered By: Lenda Kelp on 10/17/2021 12:49:19 -------------------------------------------------------------------------------- SuperBill Details Patient Name: Date of Service: [name], [name] 10/17/2021 Medical Record Number: [id number] Patient Account Number: [id number] Date of Birth/Sex: Treating RN: 1951/10/29 (70 y.o. 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

## 2021-10-19 ENCOUNTER — Telehealth: Payer: Self-pay | Admitting: *Deleted

## 2021-10-19 ENCOUNTER — Ambulatory Visit: Payer: Self-pay | Admitting: *Deleted

## 2021-10-19 ENCOUNTER — Ambulatory Visit (HOSPITAL_COMMUNITY): Payer: Medicare Other | Admitting: Licensed Clinical Social Worker

## 2021-10-19 NOTE — Patient Outreach (Signed)
°  Care Management   Follow Up Note   10/19/2021 Name: Cyrus [place] Schriefer MRN: [id number] DOB: [birth year]   Referred by: [name], MD  Reason for Referral:  Housing Barriers  An unsuccessful telephone outreach was attempted today. The patient was referred to the case management team for assistance with care management and care coordination. HIPAA compliant messages were left on voicemail, including contact information, encouraging patient to return LCSW's call at his earliest convenience.  LCSW will make a second initial telephone outreach call within the next 5-7 business days, if a return call is not received from patient in the meantime.  Follow-Up Plan: Telephone follow-up appointment with care management team member scheduled for:  10/23/2021 at 1:45 pm.  Danford Bad, BSW, MSW, LCSW  Licensed Clinical Social Worker  [facility]  Mailing [place]. [address], [place], [place] [address] Physical Address-[address] [place], [place] [address] Toll Free Main # [phone] Fax # [phone] Cell # [phone]  [email or web address]

## 2021-10-21 ENCOUNTER — Encounter: Payer: Self-pay | Admitting: Family Medicine

## 2021-10-21 ENCOUNTER — Other Ambulatory Visit: Payer: Self-pay | Admitting: Family Medicine

## 2021-10-22 ENCOUNTER — Telehealth: Payer: Self-pay | Admitting: *Deleted

## 2021-10-22 MED ORDER — HYDROCODONE-ACETAMINOPHEN 5-325 MG PO TABS: ORAL_TABLET | ORAL | 0 refills | Status: DC

## 2021-10-22 NOTE — Telephone Encounter (Signed)
° °  Telephone encounter was:  Successful.  10/22/2021 Name: [name] MRN: [id number] DOB: 1950/12/15  [name] is a 70 y.o. year old male who is a primary care patient of McGowen, Maryjean Morn, MD . The community resource team was consulted for assistance with Reached out to patient has lived there for 35 years and the new company wanted to raise rent and he is staying at [facility] , he has  place in [place] waiting for it to be ready .  Care guide performed the following interventions: Patient provided with information about care guide support team and interviewed to confirm resource needs Follow up call placed to community resources to determine status of patients referral.  Follow Up Plan:  Care guide will follow up with patient by phone over the next days and No further follow up planned at this time. The patient has been provided with needed resources.  Alois Cliche -[facility] Guide , Embedded Care Coordination [facility], Care Management  [phone] [address] [place] , [place] [address] Email : [email or web address]

## 2021-10-22 NOTE — Telephone Encounter (Signed)
Requesting: [facility] Contract:08/03/21 UDS: N/A Last Visit:10/10/21 Next Visit: 01/09/22 Last Refill: 07/26/21(60,0)  Please Advise. Med pending

## 2021-10-22 NOTE — Telephone Encounter (Signed)
Hydrocodone rx filled. Letter printed.

## 2021-10-23 ENCOUNTER — Encounter: Payer: Self-pay | Admitting: *Deleted

## 2021-10-23 ENCOUNTER — Other Ambulatory Visit: Payer: Self-pay | Admitting: *Deleted

## 2021-10-23 NOTE — Patient Outreach (Signed)
°  Care Management   Follow Up Note   10/23/2021 Name: [name] MRN: [id number] DOB: Sep 30, 1951   Referred by: [name], MD  Reason for referral : Telephone Assessment (Housing Barriers).   A second unsuccessful telephone outreach was attempted today. The patient was referred to the case management team for assistance with care management and care coordination. HIPAA compliant messages left on voicemail for patient, both home and mobile numbers, providing contact information and a brief description regarding reason for the call, encouraging patient to return LCSW's call at his earliest convenience.  LCSW will make a third outreach call attempt within the next 3-5 business days, if a return call is not received from patient in the meantime.  Unsuccessful Patient Outreach Letter mailed on 10/23/2021.  Follow-Up Plan:  LCSW coverage partner will make a third initial outreach call attempt to patient between 10/30/2021 - 11/01/2021.  Danford Bad, BSW, MSW, LCSW  Licensed Restaurant manager, [facility]  Mailing [place], [place], [place] [address] Physical Address-[address] [place], [place] [address] Toll Free Main # [phone] Fax # [phone] Cell # [phone]  [email or web address]

## 2021-10-24 ENCOUNTER — Other Ambulatory Visit: Payer: Self-pay

## 2021-10-24 ENCOUNTER — Encounter (HOSPITAL_BASED_OUTPATIENT_CLINIC_OR_DEPARTMENT_OTHER): Payer: Medicare Other | Admitting: Physician Assistant

## 2021-10-24 ENCOUNTER — Ambulatory Visit (INDEPENDENT_AMBULATORY_CARE_PROVIDER_SITE_OTHER): Payer: Medicare Other | Admitting: Infectious Diseases

## 2021-10-24 ENCOUNTER — Encounter: Payer: Self-pay | Admitting: Infectious Diseases

## 2021-10-24 DIAGNOSIS — I890 Lymphedema, not elsewhere classified: Secondary | ICD-10-CM | POA: Diagnosis not present

## 2021-10-24 DIAGNOSIS — L03119 Cellulitis of unspecified part of limb: Secondary | ICD-10-CM | POA: Diagnosis not present

## 2021-10-24 DIAGNOSIS — L03116 Cellulitis of left lower limb: Secondary | ICD-10-CM | POA: Diagnosis not present

## 2021-10-24 DIAGNOSIS — E11622 Type 2 diabetes mellitus with other skin ulcer: Secondary | ICD-10-CM | POA: Diagnosis not present

## 2021-10-24 DIAGNOSIS — L97812 Non-pressure chronic ulcer of other part of right lower leg with fat layer exposed: Secondary | ICD-10-CM | POA: Diagnosis not present

## 2021-10-24 DIAGNOSIS — L97829 Non-pressure chronic ulcer of other part of left lower leg with unspecified severity: Secondary | ICD-10-CM | POA: Diagnosis not present

## 2021-10-24 DIAGNOSIS — L97521 Non-pressure chronic ulcer of other 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 of skin: Secondary | ICD-10-CM | POA: Diagnosis not present

## 2021-10-24 DIAGNOSIS — L97519 Non-pressure chronic ulcer of other part of right foot with unspecified severity: Secondary | ICD-10-CM | POA: Diagnosis not present

## 2021-10-24 DIAGNOSIS — I87333 Chronic venous hypertension (idiopathic) with ulcer and inflammation of bilateral lower extremity: Secondary | ICD-10-CM | POA: Diagnosis not present

## 2021-10-24 DIAGNOSIS — L97322 Non-pressure chronic ulcer of left ankle with fat layer exposed: Secondary | ICD-10-CM | POA: Diagnosis not present

## 2021-10-24 DIAGNOSIS — L97522 Non-pressure chronic ulcer of other part of left foot with fat layer exposed: Secondary | ICD-10-CM | POA: Diagnosis not present

## 2021-10-24 DIAGNOSIS — L97512 Non-pressure chronic ulcer of other part of right foot with fat layer exposed: Secondary | ICD-10-CM | POA: Diagnosis not present

## 2021-10-24 DIAGNOSIS — I872 Venous insufficiency (chronic) (peripheral): Secondary | ICD-10-CM | POA: Diagnosis not present

## 2021-10-24 DIAGNOSIS — L03115 Cellulitis of right lower limb: Secondary | ICD-10-CM | POA: Diagnosis not present

## 2021-10-24 DIAGNOSIS — E1140 Type 2 diabetes mellitus with diabetic neuropathy, unspecified: Secondary | ICD-10-CM | POA: Diagnosis not present

## 2021-10-24 NOTE — Assessment & Plan Note (Signed)
Last injection 08/03/2021. Since then over the last 3 months he reports 1 episode of cellulitis from a friction blister that required treatment. He would like to continue to observe off prophylactic antibiotics.  We discussed that if he has > 3 flares in 34m period would be a good idea to touch base again with a plan.  May be more helpful to do 1g BID amoxil for suppression vs smaller dose to account for BSA if he wanted to avoid monthly injections.  Continue wound care strategies to reduce lymphedema.
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
## 2021-10-24 NOTE — Progress Notes (Addendum)
DIESEL, LINA ([id number]) Visit Report for 10/24/2021 Chief Complaint Document Details Patient Name: Date of Service: CO MALONE, VANBLARCOM 10/24/2021 10:30 A M Medical Record Number: [id number] Patient Account Number: [id number] Date of Birth/Sex: Treating RN: 12-14-1950 (70 y.o. [name] Primary Care Provider: Nicoletta Ba Other Clinician: Referring Provider: Treating Provider/Extender: Adele Dan in Treatment: 300 Information Obtained from: Patient Chief Complaint Patient is here for evaluation of venous/lymphedema ulcers Electronic Signature(s) Signed: 10/24/2021 11:35:41 AM By: Lenda Kelp PA-C Entered By: Lenda Kelp on 10/24/2021 11:35:40 -------------------------------------------------------------------------------- HPI Details Patient Name: Date of Service: [name], Clemons J. 10/24/2021 10:30 A M Medical Record Number: [id number] Patient Account Number: [id number] Date of Birth/Sex: Treating RN: 01/02/1951 (70 y.o. [name] Primary Care Provider: Nicoletta Ba Other Clinician: Referring Provider: Treating Provider/Extender: Adele Dan in Treatment: 300 History of Present Illness HPI Description: Referred by PCP for consultation. Patient has long standing history of BLE venous stasis, no prior ulcerations. At beginning of month, developed cellulitis and weeping. Received IM Rocephin followed by Keflex and resolved. Wears compression stocking, appr 6 months old. Not sure strength. No present drainage. 01/22/16 this is a patient who is a type II diabetic on insulin. 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 He tells me he religiously wears pressure stockings of uncertain strength. He was here with weeping edema about 8 months ago but did not have an open wound. Roughly a month ago he had a reopening on his bilateral legs. He is been using bandages and Neosporin.  He does not complain of pain. 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 Last BUN/creatinine I have is from 11/20/15 at 13 and 1.0 respectively 01/29/16; patient arrives today having tolerated the Profore wrap. He brought in his stockings and these are 18 mmHg stockings he bought from [place]. The compression here is likely inadequate. He does not complain of pain or excessive drainage she has no systemic symptoms. The wound on the right looks improved as does the one on the left although one on the left is more substantial with still tissue at risk below the actual wound area on the bilateral posterior calf 02/05/16; patient arrives with poor edema control. He states that we did put a 4 layer compression on it last week. No weight appear 5 this. 02/12/16; the area on the posterior right Has healed. The left Has a substantial wound that has necrotic surface eschar that requires a debridement with a curette. 02/16/16;the patient called or a Nurse visit secondary to increased swelling. He had been in earlier in the week with his right leg healed. He was transitioned to is on pressure stocking on the right leg with the only open wound on the left, a substantial area on the left posterior calf. Note he has a history of severe lower extremity edema, he has a history of chronic atrial fibrillation but not heart failure per my notes but I'll need to research this. He is not complaining of chest pain shortness of breath or orthopnea. The intake nurse noted blisters on the previously closed right leg 02/19/16; this is the patient's regular visit day. I see him on Friday with escalating edema new wounds on the right leg and clear signs of at least right ventricular heart failure. I increased his Lasix to 40 twice a  day. He is returning currently in follow-up. States he is noticed a decrease in that the  edema 02/26/16 patient's legs have much less edema. There is nothing really open on the right leg. The left leg has improved condition of the large superficial wound on the posterior left leg 03/04/16; edema control is very much better. The patient's right leg wounds have healed. On the left leg he continues to have severe venous inflammation on the posterior aspect of the left leg. There is no tenderness and I don't think any of this is cellulitis. 03/11/16; patient's right leg is married healed and he is in his own stocking. The patient's left leg has deteriorated somewhat. There is a lot of erythema around the wound on the posterior left leg. There is also a significant rim of erythema posteriorly just above where the wrap would've ended there is a new wound in this location and a lot of tenderness. Can't rule out cellulitis in this area. 03/15/16; patient's right leg remains healed and he is in his own stocking. The patient's left leg is much better than last review. His major wound on the posterior aspect of his left Is almost fully epithelialized. He has 3 small injuries from the wraps. Really. Erythema seems a lot better on antibiotics 03/18/16; right leg remains healed and he is in his own stocking. The patient's left leg is much better. The area on the posterior aspect of the left calf is fully epithelialized. His 3 small injuries which were wrap injuries on the left are improved only one seems still open his erythema has resolved 03/25/16; patient's right leg remains healed and he is in his own stocking. There is no open area today on the left leg posterior leg is completely closed up. His wrap injuries at the superior aspect of his leg are also resolved. He looks as though he has some irritation on the dorsal ankle but this is fully epithelialized without evidence of infection. 03/28/16; we discharged this patient on Monday. Transitioned him into his own stocking. There were problems almost  immediately with uncontrolled swelling weeping edema multiple some of which have opened. He does not feel systemically unwell in particular no chest pain no shortness of breath and he does not feel 04/08/16; the edema is under better control with the Profore light wrap but he still has pitting edema. There is one large wound anteriorly 2 on the medial aspect of his left leg and 3 small areas on the superior posterior calf. Drainage is not excessive he is tolerating a Profore light well 04/15/16; put a Profore wrap on him last week. This is controlled is edema however he had a lot of pain on his left anterior foot most of his wounds are healed 04/22/16 once again the patient has denuded areas on the left anterior foot which he states are because his wrap slips up word. He saw his primary physician today is on Lasix 40 twice a day and states that he his weight is down 20 pounds over the last 3 months. 04/29/16: Much improved. left anterior foot much improved. He is now on Lasix 80 mg per day. Much improved edema control 05/06/16; I was hoping to be able to discharge him today however once again he has blisters at a low level of where the compression was placed last week mostly on his left lateral but also his left medial leg and a small area on the anterior part of the left foot. 05/09/16; apparently the patient  went home after his appointment on 7/4 later in the evening developing pain in his upper medial thigh together with subjective fever and chills although his temperature was not taken. The pain was so intense he felt he would probably have to call 911. However he then remembered that he had leftover doxycycline from a previous round of antibiotics and took these. By the next morning he felt a lot better. He called and spoke to one of our nurses and I approved doxycycline over the phone thinking that this was in relation to the wounds we had previously seen although they were definitely were not. The  patient feels a lot better old fever no chills he is still working. Blood sugars are reasonably controlled 05/13/16; patient is back in for review of his cellulitis on his anterior medial upper thigh. He is taking doxycycline this is a lot better. Culture I did of the nodular area on the dorsal aspect of his foot grew MRSA this also looks a lot better. 05/20/16; the patient is cellulitis on the medial upper thigh has resolved. All of his wound areas including the left anterior foot, areas on the medial aspect of the left calf and the lateral aspect of the calf at all resolved. He has a new blister on the left dorsal foot at the level of the fourth toe this was excised. No evidence of infection 05/27/16; patient continues to complain weeping edema. He has new blisterlike wounds on the left anterior lateral and posterior lateral calf at the top of his wrap levels. The area on his left anterior foot appears better. He is not complaining of fever, pain or pruritus in his feet. 05/30/16; the patient's blisters on his left anterior leg posterior calf all look improved. He did not increase the Lasix 100 mg as I suggested because he was going to run out of his 40 mg tablets. He is still having weeping edema of his toes 06/03/16; I renewed his Lasix at 80 mg once a day as he was about to run out when I last saw him. He is on 80 mg of Lasix now. I have asked him to cut down on the excessive amount of water he was drinking and asked him to drink according to his thirst mechanisms 06/12/2016 -- was seen 2 days ago and was supposed to wear his compression stockings at home but he is developed lymphedema and superficial blisters on the left lower extremity and hence came in for a review 06/24/16; the remaining wound is on his left anterior leg. He still has edema coming from between his toes. There is lymphedema here however his edema is generally better than when I last saw this. He has a history of atrial fibrillation  but does not have a known history of congestive heart failure nevertheless I think he probably has this at least on a diastolic basis. 07/01/16 I reviewed his echocardiogram from January 2017. This was essentially normal. He did not have LVH, EF of 55-60%. His right ventricular function was normal although he did have trivial tricuspid and pulmonic regurgitation. This is not audible on exam however. I increased his Lasix to do massive edema in his legs well above his knees I think in early July. He was also drinking an excessive amount of water at the time. 07/15/16; missed his appointment last week because of the Labor Day holiday on Monday. He could not get another appointment later in the week. Started to feel the wrap digging in superiorly  so we remove the top half and the bottom half of his wrap. He has extensive erythema and blistering superiorly in the left leg. Very tender. Very swollen. Edema in his foot with leaking edema fluid. He has not been systemically unwell 07/22/16; the area on the left leg laterally required some debridement. The medial wounds look more stable. His wrap injury wounds appear to have healed. Edema and his foot is better, weeping edema is also better. He tells me he is meeting with the supplier of the external compression pumps at work 08/05/16; the patient was on vacation last week in [facility]. His wrap is been on for an extended period of time. Also over the weekend he developed an extensive area of tender erythema across his anterior medial thigh. He took to doxycycline yesterday that he had leftover from a previous prescription. The patient complains of weeping edema coming out of his toes 08/08/16; I saw this patient on 10/2. He was tender across his anterior thigh. I put him on doxycycline. He returns today in follow-up. He does not have any open wounds on his lower leg, he still has edema weeping into his toes. 08/12/16; patient was seen back urgently today to  follow-up for his extensive left thigh cellulitis/erysipelas. He comes back with a lot less swelling and erythema pain is much better. I believe I gave him Augmentin and Cipro. His wrap was cut down as he stated a roll down his legs. He developed blistering above the level of the wrap that remained. He has 2 open blisters and 1 intact. 08/19/16; patient is been doing his primary doctor who is increased his Lasix from 40-80 once a day or 80 already has less edema. Cellulitis has remained improved in the left thigh. 2 open areas on the posterior left calf 08/26/16; he returns today having new open blisters on the anterior part of his left leg. He has his compression pumps but is not yet been shown how to use some vital representative from the supplier. 09/02/16 patient returns today with no open wounds on the left leg. Some maceration in his plantar toes 09/10/2016 -- Dr. Leanord Hawking had recently discharged him on 09/02/2016 and he has come right back with redness swelling and some open ulcers on his left lower extremity. He says this was caused by trying to apply his compression stockings and he's been unable to use this and has not been able to use his lymphedema pumps. He had some doxycycline leftover and he has started on this a few days ago. 09/16/16; there are no open wounds on his leg on the left and no evidence of cellulitis. 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 He does not complain of symptoms here. I am not clear use using his external compression pumps. 09/23/16; I have not seen this patient in 2 weeks. He canceled his appointment 10 days ago as he was going on vacation. He tells me that on Monday he noticed a large area on his posterior left leg which is been draining copiously and is reopened into a large wound. He is been using ABDs and the external part of his juxtalite, according to our nurse this was not on properly. 10/07/16;  Still a substantial area on the posterior left leg. Using silver alginate 10/14/16; in general better although there is still open area which looks healthy. Still using silver alginate. He reminds me that this happen before he left for [facility]  [place]. T oday while he was showering in the morning. He had been using his juxtalite's 10/21/16; the area on his posterior left leg is fully epithelialized. However he arrives today with a large area of tender erythema in his medial and posterior left thigh just above the knee. I have marked the area. Once again he is reluctant to consider hospitalization. I treated him with oral antibiotics in the past for a similar situation with resolution I think with doxycycline however this area it seems more extensive to me. He is not complaining of fever but does have chills and says states he is thirsty. His blood sugar today was in the 140s at home 10/25/16 the area on his posterior left leg is fully epithelialized although there is still some weeping edema. The large area of tenderness and erythema in his medial and posterior left thigh is a lot less tender although there is still a lot of swelling in this thigh. He states he feels a lot better. He is on doxycycline and Augmentin that I started last week. This will continued until Tuesday, December 26. I have ordered a duplex ultrasound of the left thigh rule out DVT whether there is an abscess something that would need to be drained I would also like to know. 11/01/16; he still has weeping edema from a not fully epithelialized area on his left posterior calf. Most of the rest of this looks a lot better. He has completed his antibiotics. His thigh is a lot better. Duplex ultrasound did not show a DVT in the thigh 11/08/16; he comes in today with more Denuded surface epithelium from the posterior aspect of his calf. There is no real evidence of cellulitis. The superior aspect of his wrap appears to have put quite an  indentation in his leg just below the knee and this may have contributed. He does not complain of pain or fever. We have been using silver alginate as the primary dressing. The area of cellulitis in the right thigh has totally resolved. He has been using his compression stockings once a week 11/15/16; the patient arrives today with more loss of epithelium from the posterior aspect of his left calf. He now has a fairly substantial wound in this area. The reason behind this deterioration isn't exactly clear although his edema is not well controlled. He states he feels he is generally more swollen systemically. He is not complaining of chest pain shortness of breath fever. T me he has an appointment with his primary physician in early February. He is on 80 mg of oral ells Lasix a day. He claims compliance with the external compression pumps. He is not having any pain in his legs similar to what he has with his recurrent cellulitis 11/22/16; the patient arrives a follow-up of his large area on his left lateral calf. This looks somewhat better today. He came in earlier in the week for a dressing change since I saw him a week ago. He is not complaining of any pain no shortness of breath no chest pain 11/28/16; the patient arrives for follow-up of his large area on the left lateral calf this does not look better. In fact it is larger weeping edema. The surface of the wound does not look too bad. We have been using silver alginate although I'm not certain that this is a dressing issue. 12/05/16; again the patient follows up for a large wound on the left lateral and left posterior calf this does not look better. There  continues to be weeping edema necrotic surface tissue. More worrisome than this once again there is erythema below the wound involving the distal Achilles and heel suggestive of cellulitis. He is on his feet working most of the day of this is not going well. We are changing his dressing twice a week to  facilitate the drainage. 12/12/16; not much change in the overall dimensions of the large area on the left posterior calf. This is very inflamed looking. I gave him an. Doxycycline last week does not really seem to have helped. He found the wrap very painful indeed it seems to of dog into his legs superiorly and perhaps around the heel. He came in early today because the drainage had soaked through his dressings. 12/19/16- patient arrives for follow-up evaluation of his left lower extremity ulcers. He states that he is using his lymphedema pumps once daily when there is "no drainage". He admits to not using his lipedema pumps while under current treatment. His blood sugars have been consistently between 150-200. 12/26/16; the patient is not using his compression pumps at home because of the wetness on his feet. I've advised him that I think it's important for him to use this daily. He finds his feet too wet, he can put a plastic bag over his legs while he is in the pumps. Otherwise I think will be in a vicious circle. We are using silver alginate to the major area on his left posterior calf 01/02/17; the patient's posterior left leg has further of all into 3 open wounds. All of them covered with a necrotic surface. He claims to be using his compression pumps once a day. His edema control is marginal. Continue with silver alginate 01/10/17; the patient's left posterior leg actually looks somewhat better. There is less edema, less erythema. Still has 3 open areas covered with a necrotic surface requiring debridement. He claims to be using his compression pumps once a day his edema control is better 01/17/17; the patient's left posterior calf look better last week when I saw him and his wrap was changed 2 days ago. He has noted increasing pain in the left heel and arrives today with much larger wounds extensive erythema extending down into the entire heel area especially tender medially. He is not  systemically unwell CBGs have been controlled no fever. Our intake nurse showed me limegreen drainage on his AVD pads. 01/24/17; his usual this patient responds nicely to antibiotics last week giving him Levaquin for presumed Pseudomonas. The whole entire posterior part of his leg is much better much less inflamed and in the case of his Achilles heel area much less tender. He has also had some epithelialization posteriorly there are still open areas here and still draining but overall considerably better 01/31/17- He has continue to tolerate the compression wraps. he states that he continues to use the lymphedema pumps daily, and can increase to twice daily on the weekends. He is voicing no complaints or concerns regarding his LLE ulcers 02/07/17-he is here for follow-up evaluation. He states that he noted some erythema to the left medial and anterior thigh, which he states is new as of yesterday. He is concerned about recurrent cellulitis. He states his blood sugars have been slightly elevated, this morning in the 180s 02/14/17; he is here for follow-up evaluation. When he was last here there was erythema superiorly from his posterior wound in his anterior thigh. He was prescribed Levaquin however a culture of the wound surface grew MRSA over  the phone I changed him to doxycycline on Monday and things seem to be a lot better. 02/24/17; patient missed his appointment on Friday therefore we changed his nurse visit into a physician visit today. Still using silver alginate on the large area of the posterior left thigh. He isn't new area on the dorsal left second toe 03/03/17; actually better today although he admits he has not used his external compression pumps in the last 2 days or so because of work responsibilities over the weekend. 03/10/17; continued improvement. External compression pumps once a day almost all of his wounds have closed on the posterior left calf. Better edema control 03/17/17; in general  improved. He still has 3 small open areas on the lateral aspect of his left leg however most of the area on the posterior part of his leg is epithelialized. He has better edema control. He has an ABD pad under his stocking on the right anterior lower leg although he did not let us look at that today. 03/24/17; patient arrives back in clinic today with no open areas however there are areas on the posterior left calf and anterior left calf that are less than 100% epithelialized. His edema is well controlled in the left lower leg. There is some pitting edema probably lymphedema in the left upper thigh. He uses compression pumps at home once per day. I tried to get him to do this twice a day although he is very reticent. 04/01/2017 -- for the last 2 days he's had significant redness, tenderness and weeping and came in for an urgent visit today. 04/07/17; patient still has 6 more days of doxycycline. He was seen by Dr. Meyer Russel last Wednesday for cellulitis involving the posterior aspect, lateral aspect of his Involving his heel. For the most part he is better there is less erythema and less weeping. He has been on his feet for 12 hours 2 over the weekend. Using his compression pumps once a day 04/14/17 arrives today with continued improvement. Only one area on the posterior left calf that is not fully epithelialized. He has intense bilateral venous inflammation associated with his chronic venous insufficiency disease and secondary lymphedema. We have been using silver alginate to the left posterior calf wound In passing he tells [place] today that the right leg but we have not seen in quite some time has an open area on it but he doesn't want [place] to look at this today states he will show this to [place] next week. 04/21/17; there is no open area on his left leg although he still reports some weeping edema. He showed [place] his right leg today which is the first time we've seen this leg in a long time. He has a large area of  open wound on the right leg anteriorly healthy granulation. Quite a bit of swelling in the right leg and some degree of venous inflammation. He told us about the right leg in passing last week but states that deterioration in the right leg really only happened over the weekend 04/28/17; there is no open area on the left leg although there is an irritated part on the posterior which is like a wrap injury. The wound on the right leg which was new from last week at least to [place] is a lot better. 05/05/17; still no open area on the left leg. Patient is using his new compression stocking which seems to be doing a good job of controlling the edema. He states he is using his  compression pumps once per day. The right leg still has an open wound although it is better in terms of surface area. Required debridement. A lot of pain in the posterior right Achilles marked tenderness. Usually this type of presentation this patient gives concern for an active cellulitis 05/12/17; patient arrives today with his major wound from last week on the right lateral leg somewhat better. Still requiring debridement. He was using his compression stocking on the left leg however that is reopened with superficial wounds anteriorly he did not have an open wound on this leg previously. He is still using his juxta light's once daily at night. He cannot find the time to do this in the morning as he has to be at work by 7 AM 05/19/17; right lateral leg wound looks improved. No debridement required. The concerning area is on the left posterior leg which appears to almost have a subcutaneous hemorrhagic component to it. We've been using silver alginate to all the wounds 05/26/17; the right lateral leg wound continues to look improved. However the area on the left posterior calf is a tightly adherent surface. [place] using silver alginate. Because of the weeping edema in his legs there is very little good alternatives. 06/02/17; the patient left  here last week looking quite good. Major wound on the left posterior calf and a small one on the right lateral calf. Both of these look satisfactory. He tells me that by Wednesday he had noted increased pain in the left leg and drainage. He called on Thursday and Friday to get an appointment here but we were blocked. He did not go to urgent care or his primary physician. He thinks he had a fever on Thursday but did not actually take his temperature. He has not been using his compression pumps on the left leg because of pain. I advised him to go to the emergency room today for IV antibiotics for stents of left leg cellulitis but he has refused I have asked him to take 2 days off work to keep his leg elevated and he has refused this as well. In view of this I'm going to call him and Augmentin and doxycycline. 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 However after a detailed discussion regarding the risk benefits and alternatives he refuses to get admitted to the hospital. With no other recourse I will continue him on oral antibiotics as before and hopefully he'll have his infectious disease consultation this week. 06/16/2017 -- the patient was seen today by the nurse practitioner at infectious disease Ms. Dixon. 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 He has also been advise Lotrimin cream locally. she also had wise IV antibiotics for his condition if it flares up 06/23/17; patient arrives today with drainage bilaterally although the remaining wound on the left posterior calf after  cleaning up today "highlighter yellow drainage" did not look too bad. Unfortunately he has had breakdown on the right anterior leg [previously this leg had not been open and he is using a black stocking] he went to see infectious  disease and is been put on clindamycin 150 mg daily, I did not verify the dose although I'm not familiar with using clindamycin in this dosing range, perhaps for prophylaxisoo 06/27/17; I brought this patient back today to follow-up on the wound deterioration on the right lower leg together with surrounding cellulitis. I started him on doxycycline 4 days ago. This area looks better however he comes in today with intense cellulitis on the medial part of his left thigh. This is not have a wound in this area. Extremely tender. We've been using silver alginate to the wounds on the right lower leg left lower leg with bilateral 4 layer compression he is using his external compression pumps once a day 07/04/17; patient's left medial thigh cellulitis looks better. He has not been using his compression pumps as his insert said it was contraindicated with cellulitis. His right leg continues to make improvements all the wounds are still open. We only have one remaining wound on the left posterior calf. Using silver alginate to all open areas. He is on doxycycline which I started a week ago and should be finishing I gave him Augmentin after Thursday's visit for the severe cellulitis on the left medial thigh which fortunately looks better 07/14/17; the patient's left medial thigh cellulitis has resolved. The cellulitis in his right lower calf on the right also looks better. All of his wounds are stable to improved we've been using silver alginate he has completed the antibiotics I have given him. He has clindamycin 150 mg once a day prescribed by infectious disease for prophylaxis, I've advised him to start this now. We have been using bilateral Unna boots over silver alginate to the wound  areas 07/21/17; the patient is been to see infectious disease who noted his recurrent problems with cellulitis. He was not able to tolerate prophylactic clindamycin therefore he is on amoxicillin 500 twice a day. He also had a second daily dose of Lasix added By Dr. Oneta Rack but he is not taking this. Nor is he being completely compliant with his compression pumps a especially not this week. He has 2 remaining wounds one on the right posterior lateral lower leg and one on the left posterior medial lower leg. 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 The patient has Unna boots bilaterally. 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 He has bilateral Unna boots for compression and silver alginate to his wounds. Arrives today with his legs looking as good as I have seen him in quite some time. Not surprisingly his wounds look better as well with improvement on the right lateral leg venous insufficiency wound and also the left medial leg. He is still using the compression pumps once a day 08/11/17; both legs appear to be doing better wounds on the right lateral and left medial legs look better. Skin on the right leg quite good. He is been using silver alginate as the primary dressing. I'm going to use Anasept gel calcium alginate and maintain all the secondary dressings 08/18/17; the patient continues to actually do quite well. The area on his right lateral leg is just about closed the left medial also looks better although it is still moist in this area. His edema is well controlled we have been using Anasept gel with calcium alginate  and the usual secondary dressings, 4 layer compression and once daily use of his compression pumps "always been able to manage 09/01/17; the patient  continues to do reasonably well in spite of his trip to [place]. The area on the right lateral leg is epithelialized. Left is much better but still open. He has more edema and more chronic erythema on the left leg [venous inflammation] 09/08/17; he arrives today with no open wound on the right lateral leg and decently controlled edema. Unfortunately his left leg is not nearly as in his good situation as last week.he apparently had increasing edema starting on Saturday. He edema soaked through into his foot so used a plastic bag to walk around his home. The area on the medial right leg which was his open area is about the same however he has lost surface epithelium on the left lateral which is new and he has significant pain in the Achilles area of the left foot. He is already on amoxicillin chronically for prophylaxis of cellulitis in the left leg 09/15/17; he is completed a week of doxycycline and the cellulitis in the left posterior leg and Achilles area is as usual improved. He still has a lot of edema and fluid soaking through his dressings. There is no open wound on the right leg. He saw infectious disease NP today 09/22/17;As usual 1 we transition him from our compression wraps to his stockings things did not go well. He has several small open areas on the right leg. He states this was caused by the compression wrap on his skin although he did not wear this with the stockings over them. He has several superficial areas on the left leg medially laterally posteriorly. He does not have any evidence of active cellulitis especially involving the left Achilles The patient is traveling from [facility] Saturday going to [facility]. He states he isn't attempting to get an appointment with a heel objects wound center there to change his dressings. I am not completely certain whether this will work 10/06/17; the patient came in on Friday for a nurse visit and the nurse reported that his legs actually look  quite good. He arrives in clinic today for his regular follow-up visit. He has a new wound on his left third toe over the PIP probably caused by friction with his footwear. He has small areas on the left leg and a very superficial but epithelialized area on the right anterior lateral lower leg. Other than that his legs look as good as I've seen him in quite some time. We have been using silver alginate Review of systems; no chest pain no shortness of breath other than this a 10 point review of systems negative 10/20/17; seen by Dr. Meyer Russel last week. He had taken some antibiotics [doxycycline] that he had left over. Dr. Meyer Russel thought he had candida infection and declined to give him further antibiotics. He has a small wound remaining on the right lateral leg several areas on the left leg including a larger area on the left posterior several left medial and anterior and a small wound on the left lateral. The area on the left dorsal third toe looks a lot better. ROS; Gen.; no fever, respiratory no cough no sputum Cardiac no chest pain other than this 10 point review of system is negative 10/30/17; patient arrives today having fallen in the bathtub 3 days ago. It took him a while to get up. He has pain and maceration in the wounds on his  left leg which have deteriorated. He has not been using his pumps he also has some maceration on the right lateral leg. 11/03/17; patient continues to have weeping edema especially in the left leg. This saturates his dressings which were just put on on 12/27. As usual the doxycycline seems to take care of the cellulitis on his lower leg. He is not complaining of fever, chills, or other systemic symptoms. He states his leg feels a lot better on the doxycycline I gave him empirically. He also apparently gets injections at his primary doctor's officeo Rocephin for cellulitis prophylaxis. I didn't ask him about his compression pump compliance today I think that's probably  marginal. Arrives in the clinic with all of his dressings primary and secondary macerated full of fluid and he has bilateral edema 11/10/17; the patient's right leg looks some better although there is still a cluster of wounds on the right lateral. The left leg is inflamed with almost circumferential skin loss medially to laterally although we are still maintaining anteriorly. He does not have overt cellulitis there is a lot of drainage. He is not using compression pumps. We have been using silver alginate to the wound areas, there are not a lot of options here 11/17/17; the patient's right leg continues to be stable although there is still open wounds, better than last week. The inflammation in the left leg is better. Still loss of surface layer epithelium especially posteriorly. There is no overt cellulitis in the amount of edema and his left leg is really quite good, tells me he is using his compression pumps once a day. 11/24/17; patient's right leg has a small superficial wound laterally this continues to improve. The inflammation in the left leg is still improving however we have continuous surface layer epithelial loss posteriorly. There is no overt cellulitis in the amount of edema in both legs is really quite good. He states he is using his compression pumps on the left leg once a day for 5 out of 7 days 12/01/17; very small superficial areas on the right lateral leg continue to improve. Edema control in both legs is better today. He has continued loss of surface epithelialization and left posterior calf although I think this is better. We have been using silver alginate with large number of absorptive secondary dressings 4 layer on the left Unna boot on the right at his request. He tells me he is using his compression pumps once a day 12/08/17; he has no open area on the right leg is edema control is good here. On the left leg however he has marked erythema and tenderness breakdown of skin. He has  what appears to be a wrap injury just distal to the popliteal fossa. 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 There was swelling erythema and breakdown in the right leg. He has much worse skin breakdown in the left leg as well multiple open areas medially and posteriorly as well as laterally. He tells me he has been using his compression pumps but tells me he feels that the drainage out of his leg is worse when he uses a compression pumps. T be fair to him he is been saying this o for a while however I  don't know that I have really been listening to this. I wonder if the compression pumps are working properly 12/22/17;. Once again he arrives with severe erythema, weeping edema from the left greater than right leg. Noncompliance with compression pumps. New this visit he is complaining of pain on the lateral aspect of the right leg and the medial aspect of his right thigh. He apparently saw his cardiologist Dr. [name] who was ordered an echocardiogram area and I think this is a step in the right direction 12/25/17; started his doxycycline Monday night. There is still intense erythema of the right leg especially in the anterior thigh although there is less tenderness. The erythema around the wound on the right lateral calf also is less tender. He still complaining of pain in the left heel. His wounds are about the same right lateral left medial left lateral. Superficial but certainly not close to closure. He denies being systemically unwell no fever chills no abdominal pain no diarrhea 12/29/17; back in follow-up of his extensive right calf and right thigh cellulitis. I added amoxicillin to cover possible doxycycline resistant  strep. This seems to of done the trick he is in much less pain there is much less erythema and swelling. He has his echocardiogram at 11:00 this morning. X-ray of the left heel was also negative. 01/05/18; the patient arrived with his edema under much better control. Now that he is retired he is able to use his compression pumps daily and sometimes twice a day per the patient. He has a wound on the right leg the lateral wound looks better. Area on the left leg also looks a lot better. He has no evidence of cellulitis in his bilateral thighs I had a quick peak at his echocardiogram. He is in normal ejection fraction and normal left ventricular function. He has moderate pulmonary hypertension moderately reduced right ventricular function. One would have to wonder about chronic sleep apnea although he says he doesn't snore. He'll review the echocardiogram with his cardiologist. 01/12/18; the patient arrives with the edema in both legs under exemplary control. He is using his compression pumps daily and sometimes twice daily. His wound on the right lateral leg is just about closed. He still has some weeping areas on the posterior left calf and lateral left calf although everything is just about closed here as well. I have spoken with Aldean Baker who is the patient's nurse practitioner and infectious disease. She was concerned that the patient had not understood that the parenteral penicillin injections he was receiving for cellulitis prophylaxis was actually benefiting him. I don't think the patient actually saw that I would tend to agree we were certainly dealing with less infections although he had a serious one last month. 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 He is healed. He'll be placed in juxtalite compression wraps and increase his lymphedema pumps to twice daily. We will follow up again next week to ensure there are no issues with the new  regiment. 01/20/18-he is here for evaluation of bilateral lower extremity weeping edema. Yesterday he was placed in compression wrap to the right lower extremity and compression stocking to left lower shrubbery. He states he uses lymphedema pumps last night and again this morning and noted a blister to the left lower extremity. On exam he was noted to have drainage to the right lower extremity. He will be placed in Unna boots bilaterally and follow-up next week 01/26/18; patient was actually discharged a  week ago to his own juxta light stockings only to return the next day with bilateral lower extremity weeping edema.he was placed in bilateral Unna boots. He arrives today with pain in the back of his left leg. There is no open area on the right leg however there is a linear/wrap injury on the left leg and weeping edema on the left leg posteriorly. I spoke with infectious disease about 10 days ago. They were disappointed that the patient elected to discontinue prophylactic intramuscular penicillin shots as they felt it was particularly beneficial in reducing the frequency of his cellulitis. I discussed this with the patient today. He does not share this view. He'll definitely need antibiotics today. Finally he is traveling to [place] and trauma leaving this Saturday and returning a week later and he does not travel with his pumps. He is going by car 01/30/18; patient was seen 4 days ago and brought back in today for review of cellulitis in the left leg posteriorly. I put him on amoxicillin this really hasn't helped as much as I might like. He is also worried because he is traveling to [facility] by car. Finally we will be rewrapping him. There is no open area on the right leg over his left leg has multiple weeping areas as usual 02/09/18; The same wrap on for 10 days. He did not pick up the last doxycycline I prescribed for him. He apparently took 4 days worth he already had. There is nothing open on  his right leg and the edema control is really quite good. He's had damage in the left leg medially and laterally especially probably related to the prolonged use of Unna boots 02/12/18; the patient arrived in clinic today for a nurse visit/wrap change. He complained of a lot of pain in the left posterior calf. He is taking doxycycline that I previously prescribed for him. Unfortunately even though he used his stockings and apparently used to compression pumps twice a day he has weeping edema coming out of the lateral part of his right leg. This is coming from the lower anterior lateral skin area. 02/16/18; the patient has finished his doxycycline and will finish the amoxicillin 2 days. The area of cellulitis in the left calf posteriorly has resolved. He is no longer having any pain. He tells me he is using his compression pumps at least once a day sometimes twice. 02/23/18; the patient finished his doxycycline and Amoxil last week. On Friday he noticed a small erythematous circle about the size of a quarter on the left lower leg just above his ankle. This rapidly expanded and he now has erythema on the lateral and posterior part of the thigh. This is bright red. Also has an area on the dorsal foot just above his toes and a tender area just below the left popliteal fossa. He came off his prophylactic penicillin injections at his own insistence one or 2 months ago. This is obviously deteriorated since then 03/02/18; patient is on doxycycline and Amoxil. Culture I did last week of the weeping area on the back of his left calf grew group B strep. I have therefore renewed the amoxicillin 500 3 times a day for a further week. He has not been systemically unwell. Still complaining of an area of discomfort right under his left popliteal fossa. There is no open wound on the right leg. He tells me that he is using his pumps twice a day on most days 03/09/18; patient arrives in clinic today completing  his amoxicillin  today. The cellulitis on his left leg is better. Furthermore he tells me that he had intramuscular penicillin shots that his primary care office today. However he also states that the wrap on his right leg fell down shortly after leaving clinic last week. He developed a large blister that was present when he came in for a nurse visit later in the week and then he developed intense discomfort around this area.He tells me he is using his compression pumps 03/16/18; the patient has completed his doxycycline. The infectious part of this/cellulitis in the left heel area left popliteal area is a lot better. He has 2 open areas on the right calf. Still areas on the left calf but this is a lot better as well. 03/24/18; the patient arrives complaining of pain in the left popliteal area again. He thinks some of this is wrap injury. He has no open area on the right leg and really no open area on the left calf either except for the popliteal area. He claims to be compliant with the compression pumps 03/31/18; I gave him doxycycline last week because of cellulitis in the left popliteal area. This is a lot better although the surface epithelium is denuded off and response to this. He arrives today with uncontrolled edema in the right calf area as well as a fingernail injury in the right lateral calf. There is only a few open areas on the left 04/06/18; I gave him amoxicillin doxycycline over the last 2 weeks that the amoxicillin should be completing currently. He is not complaining of any pain or systemic symptoms. The only open areas see has is on the right lateral lower leg paradoxically I cannot see anything on the left lower leg. He tells me he is using his compression pumps twice a day on most days. Silver alginate to the wounds that are open under 4 layer compression 04/13/18; he completed antibiotics and has no new complaints. Using his compression pumps. Silver alginate that anything that's opened 04/20/18; he is  using his compression pumps religiously. Silver alginate 4 layer compression anything that's opened. He comes in today with no open wounds on the left leg but 3 on the right including a new one posteriorly. He has 2 on the right lateral and one on the right posterior. He likes Unna boots on the right leg for reasons that aren't really clear we had the usual 4 layer compression on the left. It may be necessary to move to the 4 layer compression on the right however for now I left them in the Unna boots 04/27/18; he is using his compression pumps at least once a day. He has still the wounds on the right lateral calf. The area right posteriorly has closed. He does not have an open wound on the left under 4 layer compression however on the dorsal left foot just proximal to the toes and the left third toe 2 small open areas were identified 05/11/18; he has not uses compression pumps. The areas on the right lateral calf have coalesced into one large wound necrotic surface. On the left side he has one small wound anteriorly however the edema is now weeping out of a large part of his left leg. He says he wasn't using his pumps because of the weeping fluid. I explained to him that this is the time he needs to pump more 05/18/18; patient states he is using his compression pumps twice a day. The area on the right lateral  large wound albeit superficial. On the left side he has innumerable number of small new wounds on the left calf particularly laterally but several anteriorly and medially. All these appear to have healthy granulated base these look like the remnants of blisters however they occurred under compression. The patient arrives in clinic today with his legs somewhat better. There is certainly less edema, less multiple open areas on the left calf and the right anterior leg looks somewhat better as well superficial and a little smaller. However he relates pain and erythema over the last 3-4 days in the thigh  and I looked at this today. He has not been systemically unwell no fever no chills no change in blood sugar values 05/25/18; comes in today in a better state. The severe cellulitis on his left leg seems better with the Keflex. Not as tender. He has not been systemically unwell Hard to find an open wound on the left lower leg using his compression pumps twice a day The confluent wounds on his right lateral calf somewhat better looking. These will ultimately need debridement I didn't do this today. 06/01/18; the severe cellulitis on the left anterior thigh has resolved and he is completed his Keflex. There is no open wound on the left leg however there is a superficial excoriation at the base of the third toe dorsally. Skin on the bottom of his left foot is macerated looking. The left the wounds on the lateral right leg actually looks some better although he did require debridement of the top half of this wound area with an open curet 06/09/18 on evaluation today patient appears to be doing poorly in regard to his right lower extremity in particular this appears to likely be infected he has very thick purulent discharge along with a bright green tent to the discharge. This makes me concerned about the possibility of pseudomonas. He's also having increased discomfort at this point on evaluation. Fortunately there does not appear to be any evidence of infection spreading to the other location at this time. 06/16/18 on evaluation today patient appears to actually be doing fairly well. His ulcer has actually diminished in size quite significantly at this point which is good news. Nonetheless he still does have some evidence of infection he did see infectious disease this morning before coming here for his appointment. I did review the results of their evaluation and their note today. They did actually have him discontinue the Cipro and initiate treatment with linezolid at this time. He is doing this for the  next seven days and they recommended a follow-up in four months with them. He is the keep a log of the need for intermittent antibiotic therapy between now and when he falls back up with infectious disease. This will help them gaze what exactly they need to do to try and help them out. 06/23/18; the patient arrives today with no open wounds on the left leg and left third toe healed. He is been using his compression pumps twice a day. On the right lateral leg he still has a sizable wound but this is a lot better than last time I saw this. 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 Has been using silver alginate under 4 layer compression 06/30/18; the only open wound he has is on the right lateral leg and this looks healthy. No debridement is required. We have been using silver  alginate. He does not have an open wound on the left leg. There is apparently some drainage from the dorsal proximal third toe on the left although I see no open wound here. 07/03/18 on evaluation today patient was actually here just for a nurse visit rapid change. However when he was here on Wednesday for his rat change due to having been healed on the left and then developing blisters we initiated the wrap again knowing that he would be back today for [place] to reevaluate and see were at. Unfortunately he has developed some cellulitis into the proximal portion of his right lower extremity even into the region of his thigh. 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 He was placed on one as what at that point. Nonetheless he is done with that and has been tolerating it well otherwise. Doxycycline which in the past really did not seem to be effective for him. Nonetheless I think the best option may be for [place] to definitely reinitiate the antibiotics for a longer period of time. 07/07/18; since I  last saw this patient a week ago he has had a difficult time. At that point he did not have an open wound on his left leg. We transitioned him into juxta light stockings. He was apparently in the clinic the next day with blisters on the left lateral and left medial lower calf. He also had weeping edema fluid. He was put back into a compression wrap. He was also in the clinic on Friday with intense erythema in his right thigh. Per the patient he was started on Bactrim however that didn't work at all in terms of relieving his pain and swelling. He has taken 3 doxycycline that he had left over from last time and that seems to of helped. He has blistering on the right thigh as well. 07/14/18; the erythema on his right thigh has gotten better with doxycycline that he is finishing. 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 Presumably this cellulitis in the thigh was not related to that although I think this is a bit concerning going forward. He still has an area on the right lateral calf the blister on the right medial calf just below the knee that was discussed above. On the left 2 small open areas left medial and left lateral. Edema control is adequate. He is using his compression pumps twice a day 07/20/18; continued improvement in the condition of both legs especially the edema in his bilateral thighs. He tells me he is been losing weight through a combination of diet and exercise. He is using his compression pumps twice a day. So overall she made to the remaining wounds 07/27/2018; continued improvement in condition of both legs. His edema is well controlled. The area on the right lateral leg is just about closed he had one blisters show up on the medial left upper calf. We have him in 4 layer compression. He is going on a 10-day trip to [place], [place] and [place]. He will be driving. He wants to wear Unna boots because of  the lessening amount of constriction. He will not use compression pumps while he is away 08/05/18 on evaluation today patient actually appears to be doing decently well all things considered in regard to his bilateral lower extremities. The worst ulcer is actually only posterior aspect of his left lower  extremity with a four layer compression wrap cut into his leg a couple weeks back. He did have a trip and actually had [employer] for the trip that he is worn since he was last here. Nonetheless he feels like the [employer] actually do better for him his swelling is up a little bit but he also with his trip was not taking his Lasix on a regular set schedule like he was supposed to be. He states that obviously the reason being that he cannot drive and keep going without having to urinate too frequently which makes it difficult. He did not have his pumps with him while he was away either which I think also maybe playing a role here too. 08/13/2018; the patient only has a small open wound on the right lateral calf which is a big improvement in the last month or 2. He also has the area posteriorly just below the posterior fossa on the left which I think was a wrap injury from several weeks ago. He has no current evidence of cellulitis. He tells me he is back into his compression pumps twice a day. He also tells me that while he was at the laundromat somebody stole a section of his extremitease stockings 08/20/2018; back in the clinic with a much improved state. He only has small areas on the right lateral mid calf which is just about healed. This was is more substantial area for quite a prolonged period of time. He has a small open area on the left anterior tibia. The area on the posterior calf just below the popliteal fossa is closed today. He is using his compression pumps twice a day 08/28/2018; patient has no open wound on the right leg. He has a smattering of open areas on the calf with some  weeping lymphedema. More problematically than that it looks as though his wraps of slipped down in his usual he has very angry upper area of edema just below the right medial knee and on the right lateral calf. He has no open area on his feet. The patient is traveling to [facility] next week. I will send him in an antibiotic. We will continue to wrap the right leg. We ordered extremitease stockings for him last week and I plan to transition the right leg to a stocking when he gets home which will be in 10 days time. As usual he is very reluctant to take his pumps with him when he travels 09/07/2018; patient returns from [facility]. He shows me a picture of his left leg in the mid part of his trip last week with intense fire engine erythema. The picture look bad enough I would have considered sending him to the hospital. Instead he went to the wound care center in [facility]. They did not prescribe him antibiotics but he did take some doxycycline he had leftover from a previous visit. I had given him trimethoprim sulfamethoxazole before he left this did not work according to the patient. This is resulted in some improvement fortunately. He comes back with a large wound on the left posterior calf. Smaller area on the left anterior tibia. Denuded blisters on the dorsal left foot over his toes. Does not have much in the way of wounds on the right leg although he does have a very tender area on the right posterior area just below the popliteal fossa also suggestive of infection. He promises me he is back on his pumps twice a  day 09/15/2018; the intense cellulitis in his left lower calf is a lot better. The wound area on the posterior left calf is also so better. However he has reasonably extensive wounds on the dorsal aspect of his second and third toes and the proximal foot just at the base of the toes. There is nothing open on the right leg 09/22/2018; the patient has excellent edema  control in his legs bilaterally. He is using his external compression pumps twice a day. He has no open area on the right leg and only the areas in the left foot dorsally second and third toe area on the left side. He does not have any signs of active cellulitis. 10/06/2018; the patient has good edema control bilaterally. He has no open wound on the right leg. There is a blister in the posterior aspect of his left calf that we had to deal with today. He is using his compression pumps twice a day. There is no signs of active cellulitis. We have been using silver alginate to the wound areas. He still has vulnerable areas on the base of his left first second toes dorsally He has a his extremities stockings and we are going to transition him today into the stocking on the right leg. He is cautioned that he will need to continue to use the compression pumps twice a day. If he notices uncontrolled edema in the right leg he may need to go to 3 times a day. 10/13/2018; the patient came in for a nurse check on Friday he has a large flaccid blister on the right medial calf just below the knee. We unroofed this. He has this and a new area underneath the posterior mid calf which was undoubtedly a blister as well. He also has several small areas on the right which is the area we put his extremities stocking on. 10/19/2018; the patient went to see infectious disease this morning I am not sure if that was a routine follow-up in any case the doxycycline I had given him was discontinued and started on linezolid. He has not started this. It is easy to look at his left calf and the inflammation and think this is cellulitis however he is very tender in the tissue just below the popliteal fossa and I have no doubt that there is infection going on here. He states the problem he is having is that with the compression pumps the edema goes down and then starts walking the wrap falls down. We will see if we can adhere this. He  has 1 or 2 minuscule open areas on the right still areas that are weeping on the posterior left calf, the base of his left second and third toes 10/26/18; back today in clinic with quite of skin breakdown in his left anterior leg. This may have been infection the area below the popliteal fossa seems a lot better however tremendous epithelial loss on the left anterior mid tibia area over quite inexpensive tissue. He has 2 blisters on the right side but no other open wound here. 10/29/2018; came in urgently to see [place] today and we worked him in for review. He states that the 4 layer compression on the right leg caused pain he had to cut it down to roughly his mid calf this caused swelling above the wrap and he has blisters and skin breakdown today. As a result of the pain he has not been using his pumps. Both legs are a lot more edematous and there  is a lot of weeping fluid. 11/02/18; arrives in clinic with continued difficulties in the right leg> left. Leg is swollen and painful. multiple skin blisters and new open areas especially laterally. He has not been using his pumps on the right leg. 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 He is not systemically unwell. 11/09/2018; the patient claims he is being compliant with his pumps. He is finished the doxycycline I gave him last week. Culture I did of the wound on the right lateral leg showed a few very resistant methicillin staph aureus. This was resistant to doxycycline. Nevertheless he states the pain in the leg is a lot better which makes me wonder if the cultured organism was not really what was causing the problem nevertheless this is a very dangerous organism to be culturing out of any wound. His right leg is still a lot larger than the left. He is using an Radio broadcast assistant on this area, he blames a 4-layer compression for causing the original skin breakdown which I doubt is true however I cannot talk him out  of it. We have been using silver alginate to all of these areas which were initially blisters 11/16/2018; patient is being compliant with his external compression pumps at twice a day. Miraculously he arrives in clinic today with absolutely no open wounds. He has better edema control on the left where he has been using 4 layer compression versus wound of wounds on the right and I pointed this out to him. There is no inflammation in the skin in his lower legs which is also somewhat unusual for him. There is no open wounds on the dorsal left foot. He has extremitease stockings at home and I have asked him to bring these in next week. 11/25/18 patient's lower extremity on examination today on the left appears for the most part to be wound free. He does have an open wound on the lateral aspect of the right lower extremity but this is minimal compared to what I've seen in past. He does request that we go ahead and wrap the left leg as well even though there's nothing open just so hopefully it will not reopen in short order. 1/28; patient has superficial open wounds on the right lateral calf left anterior calf and left posterior calf. His edema control is adequate. 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 We have been using silver alginate as the primary dressing. He claims compliance with his compression pumps 2/4; patient has superficial open wounds on numerous areas of his left calf and again one on the left dorsal foot. The areas on the right lateral calf have healed. The cellulitis that I gave him doxycycline for last week is also resolved this was mostly on the left anterior calf just below the tibial tuberosity. His edema looks fairly well-controlled. He tells me he went to see his primary doctor today and had blood work ordered 2/11; once again he has several open areas on the left calf left tibial area. Most of  these are small and appear to have healthy granulation. He does not have anything open on the right. The edema and control in his thighs is pretty good which is usually a good indication he has been using his pumps as requested. 2/18; he continues to have several small areas on the left calf and left tibial area. Most of  these are small healthy granulation. We put him in his stocking on the right leg last week and he arrives with a superficial open area over the right upper tibia and a fairly large area on the right lateral tibia in similar condition. His edema control actually does not look too bad, he claims to be using his compression pumps twice a day 2/25. Continued small areas on the left calf and left tibial area. New areas especially on the right are identified just below the tibial tuberosity and on the right upper tibia itself. There are also areas of weeping edema fluid even without an obvious wound. He does not have a considerable degree of lymphedema but clearly there is more edema here than his skin can handle. He states he is using the pumps twice a day. We have an Unna boot on the right and 4 layer compression on the left. 3/3; he continues to have an area on the right lateral calf and right posterior calf just below the popliteal fossa. There is a fair amount of tenderness around the wound on the popliteal fossa but I did not see any evidence of cellulitis, could just be that the wrap came down and rubbed in this area. He does not have an open area on the left leg however there is an area on the left dorsal foot at the base of the third toe We have been using silver alginate to all wound areas 3/10; he did not have an open area on his left leg last time he was here a week ago. T oday he arrives with a horizontal wound just below the tibial tuberosity and an area on the left lateral calf. He has intense erythema and tenderness in this area. The area is on the right lateral calf and  right posterior calf better than last week. We have been using silver alginate as usual 3/18 - Patient returns with 3 small open areas on left calf, and 1 small open area on right calf, the skin looks ok with no significant erythema, he continues the UNA boot on right and 4 layer compression on left. The right lateral calf wound is closed , the right posterior is small area. we will continue silver alginate to the areas. 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 He is been tolerating the dressing changes without complication. Fortunately she has made excellent improvement in regard to the overall status of his wounds. Unfortunately every time we cease wrapping him he ends up reopening in causing more significant issues at that point. Again I'm unsure of the best direction to take although I think the lymphedema clinic may be appropriate for him. 02/03/19 on evaluation today patient appears to be doing well in regard to the wounds that we saw him for last week unfortunately he has a new area on the proximal portion of his right medial/posterior lower extremity where the wrap somewhat slowed down and caused swelling and a blister to rub and open. Unfortunately this is the only opening that he has on either leg at this point. 02/17/19 on evaluation today patient's bilateral lower extremities appear to be doing well. He still completely healed in regard to the left lower extremity. In regard to the right lower extremity the area where the wrap and slid down and caused the  blister still seems to be slightly open although this is dramatically better than during the last evaluation two weeks ago. I'm very pleased with the way this stands overall. 03/03/19 on evaluation today patient appears to be doing well in regard  to his right lower extremity in general although he did have a new blister open this does not appear to be showing any evidence of active infection at this time. Fortunately there's No fevers, chills, nausea, or vomiting noted at this time. Overall I feel like he is making good progress it does feel like that the right leg will we perform the [employer] seems to do with a bit better than three layer wrap on the left which slid down on him. We may switch to doing bilateral in the book wraps. 5/4; I have not seen Mr. Diefendorf in quite some time. According to our case manager he did not have an open wound on his left leg last week. He had 1 remaining wound on the right posterior medial calf. He arrives today with multiple openings on the left leg probably were blisters and/or wrap injuries from Unna boots. I do not think the Unna boot's will provide adequate compression on the left. I am also not clear about the frequency he is using the compression pumps. 03/17/19 on evaluation today patient appears to be doing excellent in regard to his lower extremities compared to last week's evaluation apparently. He had gotten significantly worse last week which is unfortunate. The [employer] wrap on the left did not seem to do very well for him at all and in fact it didn't control his swelling significantly enough he had an additional outbreak. Subsequently we go back to the four layer compression wrap on the left. This is good news. At least in that he is doing better and the wound seem to be killing him. He still has not heard anything from the lymphedema clinic. 03/24/19 on evaluation today patient actually appears to be doing much better in regard to his bilateral lower Trinity as compared to last week when I saw him. Fortunately there's no signs of active infection at this time. He has been tolerating the dressing changes without complication. Overall I'm extremely pleased with the progress and appearance in  general. 04/07/19 on evaluation today patient appears to be doing well in regard to his bilateral lower extremities. His swelling is significantly down from where it was previous. With that being said he does have a couple blisters still open at this point but fortunately nothing that seems to be too severe and again the majority of the larger openings has healed at this time. 04/14/19 on evaluation today patient actually appears to be doing quite well in regard to his bilateral lower extremities in fact I'm not even sure there's anything significantly open at this time at any site. Nonetheless he did have some trouble with these wraps where they are somewhat irritating him secondary to the fact that he has noted that the graph wasn't too close down to the end of this foot in a little bit short as well up to his knee. Otherwise things seem to be doing quite well. 04/21/19 upon evaluation today patient's wound bed actually showed evidence of being completely healed in regard to both lower extremities which is excellent news. There does not appear to be any signs of active infection which is also good news. I'm very pleased in this regard. No fevers, chills, nausea, or vomiting noted at this time.  04/28/19 on evaluation today patient appears to be doing a little bit worse in regard to both lower extremities on the left mainly due to the fact that when he went infection disease the wrap was not wrapped quite high enough he developed a blister above this. On the right he is a small open area of nothing too significant but again this is continuing to give him some trouble he has been were in the Velcro compression that he has at home. 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 He's been tolerating the dressing changes without complication. Fortunately there's no signs of active infection at this time. No fevers, chills, nausea, or vomiting noted at  this time. We have been trying to get an appointment with her lymphedema clinic in [facility] but unfortunately nobody can get them on phone with not been able to even fax information over the patient likewise is not been able to get in touch with them. Overall I'm not sure exactly what's going on here with to reach out again today. 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 Still having a lot of drainage unfortunately. He tells me especially in the left but even on the right. There's no signs of active infection which is good news we've been using so ratcheted up to this point. 05/19/19 on evaluation today patient actually appears to be doing quite well with regard to his left lower extremity which is great news. Fortunately in regard to the right lower extremity has an issues with his wrap and he subsequently did remove this from what I'm understanding. Nonetheless long story short is what he had rewrapped once he removed it subsequently had maggots underneath this wrap whenever he came in for evaluation today. With that being said they were obviously completely cleaned away by the nursing staff. The visit today which is excellent news. However he does appear to potentially have some infection around the right ankle region where the maggots were located as well. He will likely require anabiotic therapy today. 05/26/19 on evaluation today patient actually appears to be doing much better in regard to his bilateral lower extremities. I feel like the infection is under much better control. With that being said there were maggots noted when the wrap was removed yet again today. Again this could have potentially been left over from previous although at this time there does not appear to be any signs of significant drainage there was obviously on the wrap some drainage as well this contracted gnats or  otherwise. Either way I do not see anything that appears to be doing worse in my pinion and in fact I think his drainage has slowed down quite significantly likely mainly due to the fact to his infection being under better control. 06/02/2019 on evaluation today patient actually appears to be doing well with regard to his bilateral lower extremities there is no signs of active infection at this time which is great news. With that being said he does have several open areas more so on the right than the left but nonetheless these are all significantly better than previously noted. 06/09/2019 on evaluation today patient actually appears to be doing well. His wrap stayed up and he did not cause any problems he had more drainage on the right compared to the left but overall I do not see any major issues  at this time which is great news. 06/16/2019 on evaluation today patient appears to be doing excellent with regard to his lower extremities the only area that is open is a new blister that can have opened as of today on the medial ankle on the left. Other than this he really seems to be doing great I see no major issues at this point. 06/23/2019 on evaluation today patient appears to be doing quite well with regard to his bilateral lower extremities. In fact he actually appears to be almost completely healed there is a small area of weeping noted of the right lower extremity just above the ankle. Nonetheless fortunately there is no signs of active infection at this time which is good news. No fevers, chills, nausea, vomiting, or diarrhea. 8/24; the patient arrived for a nurse visit today but complained of very significant pain in the left leg and therefore I was asked to look at this. Noted that he did not have an open area on the left leg last week nevertheless this was wrapped. The patient states that he is not been able to put his compression pumps on the left leg because of the discomfort. He has not been  systemically unwell 06/30/2019 on evaluation today patient unfortunately despite being excellent last week is doing much worse with regard to his left lower extremity today. In fact he had to come in for a nurse on Monday where his left leg had to be rewrapped due to excessive weeping Dr. Leanord Hawking placed him on doxycycline at that point. Fortunately there is no signs of active infection Systemically at this time which is good news. 07/07/2019 in regard to the patient's wounds today he actually seems to be doing well with his right lower extremity there really is nothing open or draining at this point this is great news. Unfortunately the left lower extremity is given him additional trouble at this time. There does not appear to be any signs of active infection nonetheless he does have a lot of edema and swelling noted at this point as well as blistering all of which has led to a much more poor appearing leg at this time compared to where it was 2 weeks ago when it was almost completely healed. Obviously this is a little discouraging for the patient. He is try to contact the lymphedema clinic in [place] he has not been able to get through to them. 07/14/2019 on evaluation today patient actually appears to be doing slightly better with regard to his left lower extremity ulcers. Overall I do feel like at least at the top of the wrap that we have been placing this area has healed quite nicely and looks much better. The remainder of the leg is showing signs of improvement. Unfortunately in the thigh area he still has an open region on the left and again on the right he has been utilizing just a Band-Aid on an area that also opened on the thigh. Again this is an area that were not able to wrap although we did do an Ace wrap to provide some compression that something that obviously is a little less effective than the compression wraps we have been using on the lower portion of the leg. He does have an  appointment with the lymphedema clinic in [facility] on Friday. 07/21/2019 on evaluation today patient appears to be doing better with regard to his lower extremity ulcers. He has been tolerating the dressing changes without complication. Fortunately there is no signs of  active infection at this time. No fevers, chills, nausea, vomiting, or diarrhea. I did receive the paperwork from the physical therapist at the lymphedema clinic in [place]. Subsequently I signed off on that this morning and sent that back to him for further progression with the treatment plan. 07/28/2019 on evaluation today patient appears to be doing very well with regard to his right lower extremity where I do not see any open wounds at this point. Fortunately he is feeling great as far as that is concerned as well. In regard to the left lower extremity he has been having issues with still several areas of weeping and edema although the upper leg is doing better his lower leg still I think is going require the compression wrap at this time. No fevers, chills, nausea, vomiting, or diarrhea. 08/04/2019 on evaluation today patient unfortunately is having new wounds on the right lower extremity. Again we have been using Unna boot wrap on that side. We switched him to using his juxta lite wrap at home. With that being said he tells me he has been using it although his legs extremely swollen and to be honest really does not appear that he has been. I cannot know that for sure however. Nonetheless he has multiple new wounds on the right lower extremity at this time. Obviously we will have to see about getting this rewrapped for him today. 08/11/2019 on evaluation today patient appears to be doing fairly well with regard to his wounds. He has been tolerating the dressing changes including the compression wraps without complication. He still has a lot of edema in his upper thigh regions bilaterally he is supposed to be seeing the  lymphedema clinic on the 15th of this month once his wraps arrive for the upper part of his legs. 08/18/2019 on evaluation today patient appears to be doing well with regard to his bilateral lower extremities at this point. He has been tolerating the dressing changes without complication. Fortunately there is no signs of active infection which is also good news. He does have a couple weeping areas on the first and second toe of the right foot he also has just a small area on the left foot upper leg and a small area on the left lower leg but overall he is doing quite well in my opinion. He is supposed to be getting his wraps shortly in fact tomorrow and then subsequently is seeing the lymphedema clinic next Wednesday on the 21st. Of note he is also leaving on the 25th to go on vacation for a week to the beach. For that reason and since there is some uncertainty about what there can be doing at lymphedema clinic next Wednesday I am get a make an appointment for next Friday here for [place] to see what we need to do for him prior to him leaving for vacation. 10/23; patient arrives in considerable pain predominantly in the upper posterior calf just distal to the popliteal fossa also in the wound anteriorly above the major wound. This is probably cellulitis and he has had this recurrently in the past. He has no open wound on the right side and he has had an Radio broadcast assistant in that area. Finally I note that he has an area on the left posterior calf which by enlarge is mostly epithelialized. This protrudes beyond the borders of the surrounding skin in the setting of dry scaly skin and lymphedema. The patient is leaving for [facility] [name] on Sunday. Per his longstanding pattern,  he will not take his compression pumps with him predominantly out of fear that they will be stolen. He therefore asked that we put a Unna boot back on the right leg. He will also contact the wound care center in [facility] to see if they can  change his dressing in the mid week. 11/3; patient returned from his vacation to [facility]. He was seen on 1 occasion at their wound care center. They did a 2 layer compression system as they did not have our 4-layer wrap. I am not completely certain what they put on the wounds. They did not change the Unna boot on the right. The patient is also seeing a lymphedema specialist physical therapist in [place]. It appears that he has some compression sleeve for his thighs which indeed look quite a bit better than I am used to seeing. He pumps over these with his external compression pumps. 11/10; the patient has a new wound on the right medial thigh otherwise there is no open areas on the right. He has an area on the left leg posteriorly anteriorly and medially and an area over the left second toe. We have been using silver alginate. He thinks the injury on his thigh is secondary to friction from the compression sleeve he has. 11/17; the patient has a new wound on the right medial thigh last week. He thinks this is because he did not have a underlying stocking for his thigh juxta lite apparatus. He now has this. The area is fairly large and somewhat angry but I do not think he has underlying cellulitis. He has a intact blister on the right anterior tibial area. Small wound on the right great toe dorsally Small area on the medial left calf. 11/30; the patient does not have any open areas on his right leg and we did not take his juxta lite stocking off. However he states that on Friday his compression wrap fell down lodging around his upper mid calf area. As usual this creates a lot of problems for him. He called urgently today to be seen for a nurse visit however the nurse visit turned into a provider visit because of extreme erythema and pain in the left anterior tibia extending laterally and posteriorly. The area that is problematic is extensive 10/06/2019 upon evaluation today patient actually  appears to be doing poorly in regard to his left lower extremity. He Dr. Leanord Hawking did place him on doxycycline this past Monday apparently due to the fact that he was doing much worse in regard to this left leg. Fortunately the doxycycline does seem to be helping. Unfortunately we are still having a very difficult time getting his edema under any type of control in order to anticipate discharge at some point. The only way were really able to control his lymphedema really is with compression wraps and that has only even seemingly temporary. He has been seeing a lymphedema clinic they are trying to help in this regard but still this has been somewhat frustrating in general for the patient. 10/13/19 on evaluation today patient appears to be doing excellent with regard to his right lower extremity as far as the wounds are concerned. His swelling is still quite extensive unfortunately. He is still having a lot of drainage from the thigh areas bilaterally which is unfortunate. He's been going to lymphedema clinic but again he still really does not have this edema under control as far as his lower extremities are concern. With regard to his left  lower extremity this seems to be improving and I do believe the doxycycline has been of benefit for him. He is about to complete the doxycycline. 10/20/2019 on evaluation today patient appears to be doing poorly in regard to his bilateral lower extremities. More in the right thigh he has a lot of irritation at this site unfortunately. In regard to the left lower extremity the wrap was not quite as high it appears and does seem to have caused him some trouble as well. Fortunately there is no evidence of systemic infection though he does have some blue-green drainage which has me concerned for the possibility of Pseudomonas. He tells me he is previously taking Cipro without complications and he really does not care for Levaquin however due to some of the side effects he  has. He is not allergic to any medications specifically antibiotics that were aware of. 10/27/2019 on evaluation today patient actually does appear to be for the most part doing better when compared to last week's evaluation. With that being said he still has multiple open wounds over the bilateral lower extremities. He actually forgot to start taking the Cipro and states that he still has the whole bottle. He does have several new blisters on left lower extremity today I think I would recommend he go ahead and take the Cipro based on what I am seeing at this point. 12/30-Patient comes at 1 week visit, 4 layer compression wraps on the left and Unna boot on the right, primary dressing Xtrasorb and silver alginate. Patient is taking his Cipro and has a few more days left probably 5-6, and the legs are doing better. He states he is using his compressions devices which I believe he has 11/10/2019 on evaluation today patient actually appears to be much better than last time I saw him 2 weeks ago. His wounds are significantly improved and overall I am very pleased in this regard. Fortunately there is no signs of active infection at this time. He is just a couple of days away from completing Cipro. Overall his edema is much better he has been using his lymphedema pumps which I think is also helping at this point. 11/17/2019 on evaluation today patient appears to be doing excellent in regard to his wounds in general. His legs are swollen but not nearly as much as they have been in the past. Fortunately he is tolerating the compression wraps without complication. No fevers, chills, nausea, vomiting, or diarrhea. He does have some erythema however in the distal portion of his right lower extremity specifically around the forefoot and toes there is a little bit of warmth here as well. 11/24/2019 on evaluation today patient appears to be doing well with regard to his right lower extremity I really do not see any open  wounds at this point. His left lower extremity does have several open areas and his right medial thigh also is open. Other than this however overall the patient seems to be making good progress and I am very pleased at this point. 12/01/2019 on evaluation today patient appears to be doing poorly at this point in regard to his left lower extremity has several new blisters despite the fact that we have him in compression wraps. In fact he had a 4-layer compression wrap, his upper thigh wrapped from lymphedema clinic, and a juxta light over top of the 4 layer compression wrap the lymphedema clinic applied and despite all this he still develop blisters underneath. Obviously this does have me concerned  about the fact that unfortunately despite what we are doing to try to get wounds healed he continues to have new areas arise I do not think he is ever good to be at the point where he can realistically just use wraps at home to keep things under control. Typically when we heal him it takes about 1-2 days before he is back in the clinic with severe breakdown and blistering of his lower extremities bilaterally. This is happened numerous times in the past. Unfortunately I think that we may need some help as far as overall fluid overload to kind of limit what we are seeing and get things under better control. 12/08/2019 on evaluation today patient presents for follow-up concerning his ongoing bilateral lower extremity edema. Unfortunately he is still having quite a bit of swelling the compression wraps are controlling this to some degree but he did see Dr. [name] his cardiologist I do have that available for review today as far as the appointment was concerned that was on 12/06/2019. Obviously that she has been 2 days ago. The patient states that he is only been taking the Lasix 80 mg 1 time a day he had told me previously he was taking this twice a day. Nonetheless Dr. [name] recommended this be up to 80 mg 2 times a  day for the patient as he did appear to be fluid overloaded. With that being said the patient states he did this yesterday and he was unable to go anywhere or do anything due to the fact that he was constantly having to urinate. Nonetheless I think that this is still good to be something that is important for him as far as trying to get his edema under control at all things that he is going to be able to just expect his wounds to get under control and things to be better without going through at least a period of time where he is trying to stabilize his fluid management in general and I think increasing the Lasix is likely the first step here. It was also mentioned the possibility that the patient may require metolazone. With that being said he wanted to have the patient take Lasix twice a day first and then reevaluating 2 months to see where things stand. 12/15/2019 upon evaluation today patient appears to be doing regard to his legs although his toes are showing some signs of weeping especially on the left at this point to some degree on the right. There does not appear to be any signs of active infection and overall I do feel like the compression wraps are doing well for him but he has not been able to take the Lasix at home and the increased dose that Dr. [name] recommended. He tells me that just not go to be feasible for him. Nonetheless I think in this case he should probably send a message to Dr. [name] in order to discuss options from the standpoint of possible admission to get the fluid off or otherwise going forward. 12/22/2019 upon evaluation today patient appears to be doing fairly well with regard to his lower extremities at this point. In fact he would be doing excellent if it was not for the fact that his right anterior thigh apparently had an allergic reaction to adhesive tape that he used. The wound itself that we have been monitoring actually appears to be healed. There is a lot of  irritation at this point. 12/29/2019 upon evaluation today patient appears to be doing well  in regard to his lower extremities. His left medial thigh is open and somewhat draining today but this is the only region that is open the right has done much better with the treatment utilizing the steroid cream that I prescribed for him last week. Overall I am pleased in that regard. Fortunately there is no signs of active infection at this time. No fevers, chills, nausea, vomiting, or diarrhea. 01/05/2020 upon evaluation today patient appears to be doing more poorly in regard to his right lower extremity at this point upon evaluation today. Unfortunately he continues to have issues in this regard and I think the biggest issue is controlling his edema. This obviously is not very well controlled at this point is been recommended that he use the Lasix twice a day but he has not been able to do that. Unfortunately I think this is leading to an issue where honestly he is not really able to effectively control his edema and therefore the wounds really are not doing significantly better. I do not think that he is going to be able to keep things under good control unless he is able to control his edema much better. I discussed this again in great detail with him today. 01/12/2020 good news is patient actually appears to be doing quite well today at this point. He does have an appointment with lymphedema clinic tomorrow. His legs appear healed and the toe on the left is almost completely healed. In general I am very pleased with how things stand at this point. 01/19/2020 upon evaluation today patient appears to actually be doing well in regard to his lower extremities there is nothing open at this point. Fortunately he has done extremely well more recently. Has been seeing lymphedema clinic as well. With that being said he has Velcro wraps for his lower legs as well as his upper legs. The only wound really is on his toe  which is the right great toe and this is barely anything even there. With all that being said I think it is good to be appropriate today to go ahead and switch him over to the Velcro compression wraps. 01/26/2020 upon evaluation today patient appears to be doing worse with regard to his lower extremities after last week switch him to Velcro compression wraps. Unfortunately he lasted less than 24 hours he did not have the sock portion of his Velcro wrap on the left leg and subsequently developed a blister underneath the Velcro portion. Obviously this is not good and not what we were looking for at this point. He states the lymphedema clinic did tell him to wear the wrap for 23 hours and take him off for 1 I am okay with that plan but again right now we got a get things back under control again he may have some cellulitis noted as well. 02/02/2020 upon evaluation today patient unfortunately appears to have several areas of blistering on his bilateral lower extremities today mainly on the feet. His legs do seem to be doing somewhat better which is good news. Fortunately there is no evidence of active infection at this time. No fevers, chills, nausea, vomiting, or diarrhea. 02/16/2020 upon evaluation today patient appears to be doing well at this time with regard to his legs. He has a couple weeping areas on his toes but for the most part everything is doing better and does appear to be sealed up on his legs which is excellent news. We can continue with wrapping him at this point  as he had every time we discontinue the wraps he just breaks out with new wounds. There is really no point in is going forward with this at this point. 03/08/2020 upon evaluation today patient actually appears to be doing quite well with regard to his lower extremity ulcers. He has just a very superficial and really almost nonexistent blister on the left lower extremity he has in general done very well with the compression wraps. With  that being said I do not see any signs of infection at this time which is good news. 03/29/2020 upon evaluation today patient appears to be doing well with regard to his wounds currently except for where he had several new areas that opened up due to some of the wrap slipping and causing him trouble. He states he did not realize they had slipped. Nonetheless he has a 1 area on the right and 3 new areas on the left. Fortunately there is no signs of active infection at this time which is great news. 04/05/2020 upon evaluation today patient actually appears to be doing quite well in general in regard to his legs currently. Fortunately there is no signs of active infection at this time. No fevers, chills, nausea, vomiting, or diarrhea. He tells me next week that he will actually be seen in the lymphedema clinic on Thursday at 10 AM I see him on Wednesday next week. 04/12/2020 upon evaluation today patient appears to be doing very well with regard to his lower extremities bilaterally. In fact he does not appear to have any open wounds at this point which is good news. Fortunately there is no signs of active infection at this time. No fevers, chills, nausea, vomiting, or diarrhea. 04/19/2020 upon evaluation today patient appears to be doing well with regard to his wounds currently on the bilateral lower extremities. There does not appear to be any signs of active infection at this time. Fortunately there is no evidence of systemic infection and overall very pleased at this point. Nonetheless after I held him out last week he literally had blisters the next morning already which swelled up with him being right back here in the clinic. Overall I think that he is just not can be able to be discharged with his legs the way they are he is much to volume overloaded as far as fluid is concerned and that was discussed with him today of also discussed this but should try the clinic nurse manager as well as Dr.  Leanord Hawking. 04/26/2020 upon evaluation today patient appears to be doing better with regard to his wounds currently. He is making some progress and overall swelling is under good control with the compression wraps. Fortunately there is no evidence of active infection at this time. 05/10/2020 on evaluation today patient appears to be doing overall well in regard to his lower extremities bilaterally. He is Tolerating the compression wraps without complication and with what we are seeing currently I feel like that he is making excellent progress. There is no signs of active infection at this time. 05/24/2020 upon evaluation today patient appears to be doing well in regard to his legs. The swelling is actually quite a bit down compared to where it has been in the past. Fortunately there is no sign of active infection at this time which is also good news. With that being said he does have several wounds on his toes that have opened up at this point. 05/31/2020 upon evaluation today patient appears to be doing well  with regard to his legs bilaterally where he really has no significant fluid buildup at this point overall he seems to be doing quite well. Very pleased in this regard. With regard to his toes these also seem to be drying up which is excellent. We have continue to wrap him as every time we tried as a transition to the juxta light wraps things just do not seem to get any better. 06/07/2020 upon evaluation today patient appears to be doing well with regard to his right leg at this point. Unfortunately left leg has a lot of blistering he tells me the wrap started to slide down on him when he tried to put his other Velcro wrap over top of it to help keep things in order but nonetheless still had some issues. 06/14/2020 on evaluation today patient appears to be doing well with regard to his lower extremity ulcers and foot ulcers at this point. I feel like everything is actually showing signs of improvement which  is great news overall there is no signs of active infection at this time. No fevers, chills, nausea, vomiting, or diarrhea. 06/21/2020 on evaluation today patient actually appears to be doing okay in regard to his wounds in general. With that being said the biggest issue I see is on his right foot in particular the first and second toe seem to be doing a little worse due to the fact this is staying very wet. I think he is probably getting need to change out his dressings a couple times in between each week when we see him in regard to his toes in order to keep this drier based on the location and how this is proceeding. 06/28/2020 on evaluation today patient appears to be doing a little bit more poorly overall in regard to the appearance of the skin I am actually somewhat concerned about the possibility of him having a little bit of an infection here. We discussed the course of potentially giving him a doxycycline prescription which he is taken previously with good result. With that being said I do believe that this is potentially mild and at this point easily fixed. I just do not want anything to get any worse. 07/12/2020 upon evaluation today patient actually appears to be making some progress with regard to his legs which is great news there does not appear to be any evidence of active infection. Overall very pleased with where things stand. 07/26/2020 upon evaluation today patient appears to be doing well with regard to his leg ulcers and toe ulcers at this point. He has been tolerating the compression wraps without complication overall very pleased in this regard. 08/09/2020 upon evaluation today patient appears to be doing well with regard to his lower extremities bilaterally. Fortunately there is no signs of active infection overall I am pleased with where things stand. 08/23/2020 on evaluation today patient appears to be doing well with regard to his wound. He has been tolerating the dressing  changes without complication. Fortunately there is no signs of active infection at this time. Overall his legs seem to be doing quite well which is great news and I am very pleased in that regard. No fevers, chills, nausea, vomiting, or diarrhea. 09/13/2020 upon evaluation today patient appears to be doing okay in regard to his lower extremities. He does have a fairly large blister on the right leg which I did remove the blister tissue from today so we can get this to dry out other than that however he  seems to be doing quite well. There is no signs of active infection at this time. 09/27/2020 upon evaluation today patient appears to actually be doing some better in regard to his right leg. Fortunately signs of active infection at this time which is great news. No fevers, chills, nausea, vomiting, or diarrhea. 10/04/2020 upon evaluation today patient actually appears to be showing signs of improvement which is great news with regard to his leg ulcers. Fortunately there is no signs of active infection which is great news he is still taking the antibiotics currently. No fevers, chills, nausea, vomiting, or diarrhea. 10/18/2020 on evaluation today patient appears to be doing well with regard to his legs currently. He has been tolerating the dressing changes including the wraps without complication. Fortunately there is no signs of active infection at this time. No fevers, chills, nausea, vomiting, or diarrhea. 10/25/2020 upon evaluation today patient appears to be doing decently well in regard to his wounds currently. He has been tolerating the dressing changes without complication. Overall I feel like he is making good progress albeit slow. Again this is something we can have to continue to wrap for some time to come most likely. 11/08/2020 upon evaluation today patient appears to be doing well with regard to his wounds currently. He has been tolerating the dressing changes without complication is not  currently on any antibiotics and he does not appear to show any signs of infection. He does continue to have a lot of drainage on the right leg not too severe but nonetheless this is very scattered. On the left leg this is looking to be much improved overall. 11/15/2020 upon evaluation today patient appears to be doing better with regard to his legs bilaterally. Especially the right leg which was much more significant last week. There does not appear to be any signs of active infection which is great news. No fevers, chills, nausea, vomiting, or diarrhea. 11/23/2019 upon evaluation today patient appears to be doing poorly still in regard to his lower extremities bilaterally. Unfortunately his right leg in particular appears to be doing much more poorly there is no signs really of infection this is not warm to touch but he does have a lot of drainage and weeping unfortunately. With that reason I do believe that we may need to initiate some treatment here to try to help calm down some of the swelling of the right leg. I think switching to a 4-layer compression wrap would be beneficial here. The patient is in agreement with giving this a try. 11/29/2020 upon evaluation today patient appears to be doing well currently in regard to his leg ulcers. I feel like the right leg is doing better he still has a lot of drainage but we do see some improvement here. The 4-layer compression wrap I think was helpful. 12/06/2020 upon evaluation today patient appears to be doing well with regard to his legs. In fact they seem to be doing about the best I have seen up to this point. Fortunately there is no signs of active infection at this time. No fevers, chills, nausea, vomiting, or diarrhea. 12/20/2020 upon evaluation today patient appears to be doing well at this time in regard to his legs. He is not having any significant draining which is great news. Fortunately there is no signs of active infection at this time. No fevers,  chills, nausea, vomiting, or diarrhea. 01/17/2021 upon evaluation today Verdon CumminsJesse actually appears to be doing excellent in regard to his legs. He has a  few areas again that come and go as far as his toes are concerned but overall this is doing quite well. 01/31/2021 upon evaluation today patient appears to be doing well with regard to his legs. Fortunately there does not appear to be any signs of active infection which is great news. Overall he is still having significant edema despite the compression wraps basically the 4-layer compression wrap to just keep things under control there is really not much room for play. 4/13: Mr. Rushing is a longstanding patient in our clinic and benefits greatly from weekly compression wraps. Today he has no complaints. He has been tolerating the wraps well. He states he is using the lymphedema pumps at home. 5/4; patient presents for follow-up of his chronic lymphedema/venous insufficiency ulcers. He comes weekly for compression wraps. He has no complaints today. He was unable to tolerate the Coflex 2 layer Last week so we will do the four press 4-layer compression. He has been using his lymphedema pumps daily. 5/18; patient presents for 2-week follow-up. He has no complaints or issues today. He has developed a new wound to the right foot on his fourth toe. He overall feels well and denies signs of infection. 6/1; patient presents for 2-week follow-up. He has no complaints or issues today. He denies signs of infection. 04/18/2021 upon evaluation today patient appears to be doing well with regard to his legs bilaterally. Family open wound is actually on the toe of his left foot everything else is completely closed which is great news. In general I am extremely pleased with where things stand at this point. The patient is also happy that things are doing so well. 05/02/2021 upon evaluation today patient's legs actually appear to be doing quite well today. Fortunately there  does not appear to be any signs of active infection which is great and overall I am extremely pleased with where he stands today. The patient does not appear to have any evidence of active infection at this time which is also great news. 05/09/2021 upon evaluation today patient appears to be doing a little bit more poorly in regard to his legs. Unfortunately he is having issues with some breakdown and a blood blister on the left leg this is due to I believe honestly to how it was wrapped last week. Fortunately there does not appear to be any signs of infection but nonetheless this is still a concern to be honest. No fevers, chills, nausea, vomiting, or diarrhea. 05/16/2021 upon evaluation today patient appears to be doing significantly better as compared to last week. I am very pleased with where things stand today. There does not appear to be any signs of infection which is great news and overall very pleased with where we stand. No fevers, chills, nausea, vomiting, or diarrhea. 05/30/2021 upon evaluation today patient appears to be doing well with regard to his legs. He has been tolerating the dressing changes without complication. Fortunately there does not appear to be any signs of active infection which is great news and overall I am extremely pleased with where things stand today. No fevers, chills, nausea, vomiting, or diarrhea. 06/20/2021 upon evaluation today patient actually appears to be making good progress today and very pleased with what we are seeing. I think his legs are really maintaining. As long as we continue wrapping he seems to be doing excellent in my opinion. Fortunately there is no signs of active infection at this time. No fevers, chills, nausea, vomiting, or diarrhea. 07/11/2021 upon evaluation  today patient actually appears to be making excellent progress at this time. Fortunately there does not appear to be any evidence of active infection which is great news and overall I am  extremely pleased with where things stand today. No fevers, chills, nausea, vomiting, or diarrhea. 07/25/2021 upon evaluation today patient appears to be doing well currently in regard to his lower extremities. He has been making good progress here and I do not see anything that is actually open significantly today this is great news. No fevers, chills, nausea, vomiting, or diarrhea. 08/08/2021 upon evaluation today patient appears to be doing well with regard to his wound. He has been tolerating the dressing changes without complication. With that being said unfortunately has a new area that opened up as far as his right posterior leg is concerned this was a blister he also has an area on the third toe right foot which also reopen. Fortunately there is no signs of active infection at this time which is great news. No fevers, chills, nausea, vomiting, or diarrhea. 10/17; patient came in today at his request initially for a nurse visit because it but out of concern for deterioration in both his lower legs and cellulitis I was asked to look at him. He comes in with increased swelling which he says started over the weekend he started to notice pain as well in his left medial ankle, right knee, left knee left dorsal foot. His wraps fell down contributing to some of this but he has not been using his compression pumps over the weekend for reasons that are not really clear. He comes in with multiple new wounds including the right posterior leg, right third toe, right fourth toe, left second toe left medial ankle left dorsal ankle and right anterior lower leg 09/19/2021 upon evaluation today patient does seem to be making improvements in general which is great news. I do not see any evidence of infection currently he does have some hypergranulation of the anterior portion of his ankle on the left side this is going require some debridement to pare this down and then subsequently silver nitrate probably due to  the amount of bleeding that he is probably going to experience. He is in agreement with this plan however. 09/26/2021 upon evaluation today although the patient's legs appear to be doing okay today unfortunately he did have maggots noted during the evaluation as well. This is again quite unfortunate with the respect to the fact that he feels like that he got the wrap wet which was noted today he is not sure when and I think this is what led to the issue. Nonetheless we can try to see what we can do about silly things off so that this does not happen again in the future although he is can have to be diligent about taking care of his wraps as well. 10/03/2021 upon evaluation today patient appears to be doing okay in regard to his legs currently. He unfortunately has maggots again noted on the left foot. This obviously is becoming quite an issue to be perfectly honest. I do think that he needs to see what can be done at home to try to limit this exposure. Last week we had cleaned everything away so this is a new infestation as far as that is concerned. There is really not much that I can do to combat that I am trying to do what we can here in the clinic to wrap his foot and try to prevent any  access of that again with knots there is a small this becomes very difficult. 10/10/2021 upon evaluation today patient appears to be doing poorly in regard to his legs he is very swollen and unfortunately I think a big part of the issue here is simply that he is not taking his fluid pills appropriately stressed probably is not helping much at all either. He still having a lot of issues as far as getting moved and having to be out of his current living condition. With that being said he does have of note maggots noted on the right foot this time completely separate from what we have been seeing he tells me that he got his wraps wet again. Its mainly when it rains it sounds like that he goes out to his car in the area  where he has to park there is a area that collects a fairly large puddle according to what he tells me today either way I explained to him which we have done before as well that if he gets his wraps wet he needs to let us know so we can get them changed out. He does not need to keep them on wet. 10/17/2021 upon evaluation today patient appears to be doing well in regard to his legs. In fact things are significantly better compared to where they were previous. Fortunately I see no evidence of infection currently which is great news. No fevers, chills, nausea, vomiting, or diarrhea. 10/24/2021 upon evaluation today patient appears to be doing awesome in regard to his legs in general. I think everything is showing signs of significant improvement which is great news. There is really only 1 opening on the second toe left foot everything else is closed. Electronic Signature(s) Signed: 10/24/2021 1:04:25 PM By: Lenda Kelp PA-C Entered By: Lenda Kelp on 10/24/2021 13:04:25 -------------------------------------------------------------------------------- Physical Exam Details Patient Name: Date of Service: [facility], Petr J. 10/24/2021 10:30 A M Medical Record Number: [id number] Patient Account Number: [id number] Date of Birth/Sex: Treating RN: [birth year] (70 y.o. [name] Primary Care Provider: Nicoletta Ba Other Clinician: Referring Provider: Treating Provider/Extender: Adele Dan in Treatment: 300 Constitutional Obese and well-hydrated in no acute distress. Respiratory normal breathing without difficulty. Psychiatric this patient is able to make decisions and demonstrates good insight into disease process. Alert and Oriented x 3. pleasant and cooperative. Notes Upon inspection patient's wounds again are showing signs of improvement in general and overall I think that he has made great progress. I do not see any signs of anything worsening  currently. There were no evidences of maggots either at this point all of which I think is awesome. Electronic Signature(s) Signed: 10/24/2021 1:05:14 PM By: Lenda Kelp PA-C Entered By: Lenda Kelp on 10/24/2021 13:05:13 -------------------------------------------------------------------------------- Physician Orders Details Patient Name: Date of Service: [name], Shandon J. 10/24/2021 10:30 A M Medical Record Number: [id number] Patient Account Number: [id number] Date of Birth/Sex: Treating RN: [birth year] (70 y.o. 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 Leonard Schwartz Wednesday 11/07/2021 Return A Nurse Visit: - Wednesday 10/31/2021 Bathing/ Shower/ Hygiene May shower with protection but do not get wound dressing(s) wet. Edema Control - Lymphedema / SCD / Other Bilateral Lower Extremities Lymphedema Pumps. Use Lymphedema pumps on leg(s) 2-3 times a day for 45-60 minutes. If wearing any wraps or hose, do not remove them. Continue  exercising as instructed. Elevate legs to the level of the heart or above for 30 minutes daily and/or when sitting, a frequency of: - throughout the day Avoid standing for long periods of time. Exercise regularly Other Edema Control Orders/Instructions: - both legs 4 layer compression with unna boot to secure to upper portion of lower leg. apply lotion to both legs. Wound Treatment Wound #204 - T Second oe Wound Laterality: Left Prim Dressing: KerraCel Ag Gelling Fiber Dressing, 2x2 in (silver alginate) ary Discharge Instructions: Apply silver alginate to wound bed as instructed Secondary Dressing: Woven Gauze Sponges 2x2 in Discharge Instructions: Apply over primary dressing as directed. Secured With: Coban Self-Adherent Wrap 4x5 (in/yd) Discharge Instructions: Secure with Coban as directed. Electronic Signature(s) Signed: 10/24/2021 5:18:31 PM By: Lenda Kelp PA-C Signed: 10/24/2021 6:10:28 PM By: Shawn Stall RN, BSN Entered By: Shawn Stall on 10/24/2021 12:25:40 -------------------------------------------------------------------------------- Problem List Details Patient Name: Date of Service: [name], Khamauri J. 10/24/2021 10:30 A M Medical Record Number: [id number] Patient Account Number: [id number] Date of Birth/Sex: Treating RN: [birth year] (70 y.o. [name] Primary Care Provider: Nicoletta Ba Other Clinician: Referring Provider: Treating Provider/Extender: Adele Dan in Treatment: 300 Active Problems ICD-10 Encounter Code Description Active Date MDM Diagnosis L97.521 Non-pressure chronic ulcer of other 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 of 04/27/2018 No Yes skin L97.829 Non-pressure chronic ulcer of other part of left lower leg with unspecified 03/21/2021 No Yes severity L97.519 Non-pressure chronic ulcer of other part of right foot with unspecified severity 03/21/2021 No Yes L97.812 Non-pressure chronic ulcer of other  part of right lower leg with fat layer 08/08/2021 No Yes exposed I87.333 Chronic venous hypertension (idiopathic) with ulcer and inflammation of 01/22/2016 No Yes bilateral lower extremity L03.116 Cellulitis of left lower limb 08/20/2021 No Yes I89.0 Lymphedema, not elsewhere classified 01/22/2016 No Yes E11.40 Type 2 diabetes mellitus with diabetic neuropathy, unspecified 01/22/2016 No Yes E11.622 Type 2 diabetes mellitus with other skin ulcer 01/22/2016 No Yes L03.115 Cellulitis of right lower limb 12/22/2017 No Yes Inactive Problems ICD-10 Code Description Active Date Inactive Date 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 06/30/2018 06/30/2018 L97.511 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 of skin 06/30/2018 06/30/2018 Resolved Problems ICD-10 Code Description Active Date Resolved Date L97.211 Non-pressure chronic ulcer of right calf limited to breakdown of skin 06/30/2018 06/30/2018 L97.221 Non-pressure chronic ulcer of left calf limited to breakdown of skin 09/30/2016 09/30/2016 L03.116 Cellulitis of left lower limb 04/01/2017 04/01/2017 L97.211 Non-pressure chronic ulcer of right calf limited to breakdown of skin 06/30/2017 06/30/2017 Electronic Signature(s) Signed: 10/24/2021 11:35:07 AM By: Lenda Kelp PA-C Entered By: Lenda Kelp on 10/24/2021 11:35:06 -------------------------------------------------------------------------------- Progress Note Details Patient Name: Date of Service: [name], Prabhav J.  10/24/2021 10:30 A M Medical Record Number: [id number] Patient Account Number: [id number] Date of Birth/Sex: Treating RN: 12-04-1950 (70 y.o. [name] Primary Care Provider: Nicoletta Ba Other Clinician: Referring Provider: Treating Provider/Extender: Adele Dan in Treatment: 300 Subjective Chief Complaint Information obtained from Patient Patient is here for evaluation  of venous/lymphedema ulcers History of Present Illness (HPI) Referred by PCP for consultation. Patient has long standing history of BLE venous stasis, no prior ulcerations. At beginning of month, developed cellulitis and weeping. Received IM Rocephin followed by Keflex and resolved. Wears compression stocking, appr 6 months old. Not sure strength. No present drainage. 01/22/16 this is a patient who is a type II diabetic on insulin. 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 He tells me he religiously wears pressure stockings of uncertain strength. He was here with weeping edema about 8 months ago but did not have an open wound. Roughly a month ago he had a reopening on his bilateral legs. He is been using bandages and Neosporin. He does not complain of pain. 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 Last BUN/creatinine I have is from 11/20/15 at 13 and 1.0 respectively 01/29/16; patient arrives today having tolerated the Profore wrap. He brought in his stockings and these are 18 mmHg stockings he bought from [place]. The compression here is likely inadequate. He does not complain of pain or excessive drainage she has no systemic symptoms. The wound on the right looks improved as does the one on the left although one on the left is more substantial with still tissue at risk below the actual wound area on the bilateral posterior calf 02/05/16; patient arrives with poor edema control. He states that we did put a 4 layer compression on it last week. No weight appear 5 this. 02/12/16; the area on the posterior right Has healed. The left Has a substantial wound that has necrotic surface eschar that requires a debridement with a curette. 02/16/16;the patient called or a Nurse visit secondary to increased swelling. He had been in earlier in the week with his right leg  healed. He was transitioned to is on pressure stocking on the right leg with the only open wound on the left, a substantial area on the left posterior calf. Note he has a history of severe lower extremity edema, he has a history of chronic atrial fibrillation but not heart failure per my notes but I'll need to research this. He is not complaining of chest pain shortness of breath or orthopnea. The intake nurse noted blisters on the previously closed right leg 02/19/16; this is the patient's regular visit day. I see him on Friday with escalating edema new wounds on the right leg and clear signs of at least right ventricular heart failure. I increased his Lasix to 40 twice a day. He is returning currently in follow-up. States he is noticed a decrease in that the edema 02/26/16 patient's legs have much less edema. There is nothing really open on the right leg. The left leg has improved condition of the large superficial wound on the posterior left leg 03/04/16; edema control is very much better. The patient's right leg wounds have healed. On the left leg he continues to have severe venous inflammation on the posterior aspect of the left leg. There is no tenderness and I don't think any of this is  cellulitis. 03/11/16; patient's right leg is married healed and he is in his own stocking. The patient's left leg has deteriorated somewhat. There is a lot of erythema around the wound on the posterior left leg. There is also a significant rim of erythema posteriorly just above where the wrap would've ended there is a new wound in this location and a lot of tenderness. Can't rule out cellulitis in this area. 03/15/16; patient's right leg remains healed and he is in his own stocking. The patient's left leg is much better than last review. His major wound on the posterior aspect of his left Is almost fully epithelialized. He has 3 small injuries from the wraps. Really. Erythema seems a lot better on antibiotics 03/18/16;  right leg remains healed and he is in his own stocking. The patient's left leg is much better. The area on the posterior aspect of the left calf is fully epithelialized. His 3 small injuries which were wrap injuries on the left are improved only one seems still open his erythema has resolved 03/25/16; patient's right leg remains healed and he is in his own stocking. There is no open area today on the left leg posterior leg is completely closed up. His wrap injuries at the superior aspect of his leg are also resolved. He looks as though he has some irritation on the dorsal ankle but this is fully epithelialized without evidence of infection. 03/28/16; we discharged this patient on Monday. Transitioned him into his own stocking. There were problems almost immediately with uncontrolled swelling weeping edema multiple some of which have opened. He does not feel systemically unwell in particular no chest pain no shortness of breath and he does not feel 04/08/16; the edema is under better control with the Profore light wrap but he still has pitting edema. There is one large wound anteriorly 2 on the medial aspect of his left leg and 3 small areas on the superior posterior calf. Drainage is not excessive he is tolerating a Profore light well 04/15/16; put a Profore wrap on him last week. This is controlled is edema however he had a lot of pain on his left anterior foot most of his wounds are healed 04/22/16 once again the patient has denuded areas on the left anterior foot which he states are because his wrap slips up word. He saw his primary physician today is on Lasix 40 twice a day and states that he his weight is down 20 pounds over the last 3 months. 04/29/16: Much improved. left anterior foot much improved. He is now on Lasix 80 mg per day. Much improved edema control 05/06/16; I was hoping to be able to discharge him today however once again he has blisters at a low level of where the compression was placed  last week mostly on his left lateral but also his left medial leg and a small area on the anterior part of the left foot. 05/09/16; apparently the patient went home after his appointment on 7/4 later in the evening developing pain in his upper medial thigh together with subjective fever and chills although his temperature was not taken. The pain was so intense he felt he would probably have to call 911. However he then remembered that he had leftover doxycycline from a previous round of antibiotics and took these. By the next morning he felt a lot better. He called and spoke to one of our nurses and I approved doxycycline over the phone thinking that this was in relation to  the wounds we had previously seen although they were definitely were not. The patient feels a lot better old fever no chills he is still working. Blood sugars are reasonably controlled 05/13/16; patient is back in for review of his cellulitis on his anterior medial upper thigh. He is taking doxycycline this is a lot better. Culture I did of the nodular area on the dorsal aspect of his foot grew MRSA this also looks a lot better. 05/20/16; the patient is cellulitis on the medial upper thigh has resolved. All of his wound areas including the left anterior foot, areas on the medial aspect of the left calf and the lateral aspect of the calf at all resolved. He has a new blister on the left dorsal foot at the level of the fourth toe this was excised. No evidence of infection 05/27/16; patient continues to complain weeping edema. He has new blisterlike wounds on the left anterior lateral and posterior lateral calf at the top of his wrap levels. The area on his left anterior foot appears better. He is not complaining of fever, pain or pruritus in his feet. 05/30/16; the patient's blisters on his left anterior leg posterior calf all look improved. He did not increase the Lasix 100 mg as I suggested because he was going to run out of his 40 mg  tablets. He is still having weeping edema of his toes 06/03/16; I renewed his Lasix at 80 mg once a day as he was about to run out when I last saw him. He is on 80 mg of Lasix now. I have asked him to cut down on the excessive amount of water he was drinking and asked him to drink according to his thirst mechanisms 06/12/2016 -- was seen 2 days ago and was supposed to wear his compression stockings at home but he is developed lymphedema and superficial blisters on the left lower extremity and hence came in for a review 06/24/16; the remaining wound is on his left anterior leg. He still has edema coming from between his toes. There is lymphedema here however his edema is generally better than when I last saw this. He has a history of atrial fibrillation but does not have a known history of congestive heart failure nevertheless I think he probably has this at least on a diastolic basis. 07/01/16 I reviewed his echocardiogram from January 2017. This was essentially normal. He did not have LVH, EF of 55-60%. His right ventricular function was normal although he did have trivial tricuspid and pulmonic regurgitation. This is not audible on exam however. I increased his Lasix to do massive edema in his legs well above his knees I think in early July. He was also drinking an excessive amount of water at the time. 07/15/16; missed his appointment last week because of the Labor Day holiday on Monday. He could not get another appointment later in the week. Started to feel the wrap digging in superiorly so we remove the top half and the bottom half of his wrap. He has extensive erythema and blistering superiorly in the left leg. Very tender. Very swollen. Edema in his foot with leaking edema fluid. He has not been systemically unwell 07/22/16; the area on the left leg laterally required some debridement. The medial wounds look more stable. His wrap injury wounds appear to have healed. Edema and his foot is better,  weeping edema is also better. He tells me he is meeting with the supplier of the external compression pumps at work  08/05/16; the patient was on vacation last week in [facility]. His wrap is been on for an extended period of time. Also over the weekend he developed an extensive area of tender erythema across his anterior medial thigh. He took to doxycycline yesterday that he had leftover from a previous prescription. The patient complains of weeping edema coming out of his toes 08/08/16; I saw this patient on 10/2. He was tender across his anterior thigh. I put him on doxycycline. He returns today in follow-up. He does not have any open wounds on his lower leg, he still has edema weeping into his toes. 08/12/16; patient was seen back urgently today to follow-up for his extensive left thigh cellulitis/erysipelas. He comes back with a lot less swelling and erythema pain is much better. I believe I gave him Augmentin and Cipro. His wrap was cut down as he stated a roll down his legs. He developed blistering above the level of the wrap that remained. He has 2 open blisters and 1 intact. 08/19/16; patient is been doing his primary doctor who is increased his Lasix from 40-80 once a day or 80 already has less edema. Cellulitis has remained improved in the left thigh. 2 open areas on the posterior left calf 08/26/16; he returns today having new open blisters on the anterior part of his left leg. He has his compression pumps but is not yet been shown how to use some vital representative from the supplier. 09/02/16 patient returns today with no open wounds on the left leg. Some maceration in his plantar toes 09/10/2016 -- Dr. Leanord Hawking had recently discharged him on 09/02/2016 and he has come right back with redness swelling and some open ulcers on his left lower extremity. He says this was caused by trying to apply his compression stockings and he's been unable to use this and has not been able to use  his lymphedema pumps. He had some doxycycline leftover and he has started on this a few days ago. 09/16/16; there are no open wounds on his leg on the left and no evidence of cellulitis. 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 He does not complain of symptoms here. I am not clear use using his external compression pumps. 09/23/16; I have not seen this patient in 2 weeks. He canceled his appointment 10 days ago as he was going on vacation. He tells me that on Monday he noticed a large area on his posterior left leg which is been draining copiously and is reopened into a large wound. He is been using ABDs and the external part of his juxtalite, according to our nurse this was not on properly. 10/07/16; Still a substantial area on the posterior left leg. Using silver alginate 10/14/16; in general better although there is still open area which looks healthy. Still using silver alginate. He reminds me that this happen before he left for [facility]. T oday while he was showering in the morning. He had been using his juxtalite's 10/21/16; the area on his posterior left leg is fully epithelialized. However he arrives today with a large area of tender erythema in his medial and posterior left thigh just above the knee. I have marked the area. Once again he is reluctant to consider hospitalization. I treated him with oral antibiotics in the past for a similar situation with resolution I think with doxycycline however this area it seems more extensive to me. He is not complaining of  fever but does have chills and says states he is thirsty. His blood sugar today was in the 140s at home 10/25/16 the area on his posterior left leg is fully epithelialized although there is still some weeping edema. The large area of tenderness and erythema in his medial and posterior left thigh is a lot less tender although there is still a lot of swelling in this thigh.  He states he feels a lot better. He is on doxycycline and Augmentin that I started last week. This will continued until Tuesday, December 26. I have ordered a duplex ultrasound of the left thigh rule out DVT whether there is an abscess something that would need to be drained I would also like to know. 11/01/16; he still has weeping edema from a not fully epithelialized area on his left posterior calf. Most of the rest of this looks a lot better. He has completed his antibiotics. His thigh is a lot better. Duplex ultrasound did not show a DVT in the thigh 11/08/16; he comes in today with more Denuded surface epithelium from the posterior aspect of his calf. There is no real evidence of cellulitis. The superior aspect of his wrap appears to have put quite an indentation in his leg just below the knee and this may have contributed. He does not complain of pain or fever. We have been using silver alginate as the primary dressing. The area of cellulitis in the right thigh has totally resolved. He has been using his compression stockings once a week 11/15/16; the patient arrives today with more loss of epithelium from the posterior aspect of his left calf. He now has a fairly substantial wound in this area. The reason behind this deterioration isn't exactly clear although his edema is not well controlled. He states he feels he is generally more swollen systemically. He is not complaining of chest pain shortness of breath fever. T me he has an appointment with his primary physician in early February. He is on 80 mg of oral ells Lasix a day. He claims compliance with the external compression pumps. He is not having any pain in his legs similar to what he has with his recurrent cellulitis 11/22/16; the patient arrives a follow-up of his large area on his left lateral calf. This looks somewhat better today. He came in earlier in the week for a dressing change since I saw him a week ago. He is not complaining of any  pain no shortness of breath no chest pain 11/28/16; the patient arrives for follow-up of his large area on the left lateral calf this does not look better. In fact it is larger weeping edema. The surface of the wound does not look too bad. We have been using silver alginate although I'm not certain that this is a dressing issue. 12/05/16; again the patient follows up for a large wound on the left lateral and left posterior calf this does not look better. There continues to be weeping edema necrotic surface tissue. More worrisome than this once again there is erythema below the wound involving the distal Achilles and heel suggestive of cellulitis. He is on his feet working most of the day of this is not going well. We are changing his dressing twice a week to facilitate the drainage. 12/12/16; not much change in the overall dimensions of the large area on the left posterior calf. This is very inflamed looking. I gave him an. Doxycycline last week does not really seem to have helped. He  found the wrap very painful indeed it seems to of dog into his legs superiorly and perhaps around the heel. He came in early today because the drainage had soaked through his dressings. 12/19/16- patient arrives for follow-up evaluation of his left lower extremity ulcers. He states that he is using his lymphedema pumps once daily when there is "no drainage". He admits to not using his lipedema pumps while under current treatment. His blood sugars have been consistently between 150-200. 12/26/16; the patient is not using his compression pumps at home because of the wetness on his feet. I've advised him that I think it's important for him to use this daily. He finds his feet too wet, he can put a plastic bag over his legs while he is in the pumps. Otherwise I think will be in a vicious circle. We are using silver alginate to the major area on his left posterior calf 01/02/17; the patient's posterior left leg has further of all into  3 open wounds. All of them covered with a necrotic surface. He claims to be using his compression pumps once a day. His edema control is marginal. Continue with silver alginate 01/10/17; the patient's left posterior leg actually looks somewhat better. There is less edema, less erythema. Still has 3 open areas covered with a necrotic surface requiring debridement. He claims to be using his compression pumps once a day his edema control is better 01/17/17; the patient's left posterior calf look better last week when I saw him and his wrap was changed 2 days ago. He has noted increasing pain in the left heel and arrives today with much larger wounds extensive erythema extending down into the entire heel area especially tender medially. He is not systemically unwell CBGs have been controlled no fever. Our intake nurse showed me limegreen drainage on his AVD pads. 01/24/17; his usual this patient responds nicely to antibiotics last week giving him Levaquin for presumed Pseudomonas. The whole entire posterior part of his leg is much better much less inflamed and in the case of his Achilles heel area much less tender. He has also had some epithelialization posteriorly there are still open areas here and still draining but overall considerably better 01/31/17- He has continue to tolerate the compression wraps. he states that he continues to use the lymphedema pumps daily, and can increase to twice daily on the weekends. He is voicing no complaints or concerns regarding his LLE ulcers 02/07/17-he is here for follow-up evaluation. He states that he noted some erythema to the left medial and anterior thigh, which he states is new as of yesterday. He is concerned about recurrent cellulitis. He states his blood sugars have been slightly elevated, this morning in the 180s 02/14/17; he is here for follow-up evaluation. When he was last here there was erythema superiorly from his posterior wound in his anterior thigh. He  was prescribed Levaquin however a culture of the wound surface grew MRSA over the phone I changed him to doxycycline on Monday and things seem to be a lot better. 02/24/17; patient missed his appointment on Friday therefore we changed his nurse visit into a physician visit today. Still using silver alginate on the large area of the posterior left thigh. He isn't new area on the dorsal left second toe 03/03/17; actually better today although he admits he has not used his external compression pumps in the last 2 days or so because of work responsibilities over the weekend. 03/10/17; continued improvement. External compression pumps once  a day almost all of his wounds have closed on the posterior left calf. Better edema control 03/17/17; in general improved. He still has 3 small open areas on the lateral aspect of his left leg however most of the area on the posterior part of his leg is epithelialized. He has better edema control. He has an ABD pad under his stocking on the right anterior lower leg although he did not let us look at that today. 03/24/17; patient arrives back in clinic today with no open areas however there are areas on the posterior left calf and anterior left calf that are less than 100% epithelialized. His edema is well controlled in the left lower leg. There is some pitting edema probably lymphedema in the left upper thigh. He uses compression pumps at home once per day. I tried to get him to do this twice a day although he is very reticent. 04/01/2017 -- for the last 2 days he's had significant redness, tenderness and weeping and came in for an urgent visit today. 04/07/17; patient still has 6 more days of doxycycline. He was seen by Dr. Meyer Russel last Wednesday for cellulitis involving the posterior aspect, lateral aspect of his Involving his heel. For the most part he is better there is less erythema and less weeping. He has been on his feet for 12 hours o2 over the weekend. Using his  compression pumps once a day 04/14/17 arrives today with continued improvement. Only one area on the posterior left calf that is not fully epithelialized. He has intense bilateral venous inflammation associated with his chronic venous insufficiency disease and secondary lymphedema. We have been using silver alginate to the left posterior calf wound In passing he tells [place] today that the right leg but we have not seen in quite some time has an open area on it but he doesn't want [place] to look at this today states he will show this to [place] next week. 04/21/17; there is no open area on his left leg although he still reports some weeping edema. He showed [place] his right leg today which is the first time we've seen this leg in a long time. He has a large area of open wound on the right leg anteriorly healthy granulation. Quite a bit of swelling in the right leg and some degree of venous inflammation. He told us about the right leg in passing last week but states that deterioration in the right leg really only happened over the weekend 04/28/17; there is no open area on the left leg although there is an irritated part on the posterior which is like a wrap injury. The wound on the right leg which was new from last week at least to [place] is a lot better. 05/05/17; still no open area on the left leg. Patient is using his new compression stocking which seems to be doing a good job of controlling the edema. He states he is using his compression pumps once per day. The right leg still has an open wound although it is better in terms of surface area. Required debridement. A lot of pain in the posterior right Achilles marked tenderness. Usually this type of presentation this patient gives concern for an active cellulitis 05/12/17; patient arrives today with his major wound from last week on the right lateral leg somewhat better. Still requiring debridement. He was using his compression stocking on the left leg however that is  reopened with superficial wounds anteriorly he did not have an open wound  on this leg previously. He is still using his juxta light's once daily at night. He cannot find the time to do this in the morning as he has to be at work by 7 AM 05/19/17; right lateral leg wound looks improved. No debridement required. The concerning area is on the left posterior leg which appears to almost have a subcutaneous hemorrhagic component to it. We've been using silver alginate to all the wounds 05/26/17; the right lateral leg wound continues to look improved. However the area on the left posterior calf is a tightly adherent surface. [place] using silver alginate. Because of the weeping edema in his legs there is very little good alternatives. 06/02/17; the patient left here last week looking quite good. Major wound on the left posterior calf and a small one on the right lateral calf. Both of these look satisfactory. He tells me that by Wednesday he had noted increased pain in the left leg and drainage. He called on Thursday and Friday to get an appointment here but we were blocked. He did not go to urgent care or his primary physician. He thinks he had a fever on Thursday but did not actually take his temperature. He has not been using his compression pumps on the left leg because of pain. I advised him to go to the emergency room today for IV antibiotics for stents of left leg cellulitis but he has refused I have asked him to take 2 days off work to keep his leg elevated and he has refused this as well. In view of this I'm going to call him and Augmentin and doxycycline. 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 However after a detailed discussion regarding the risk benefits and  alternatives he refuses to get admitted to the hospital. With no other recourse I will continue him on oral antibiotics as before and hopefully he'll have his infectious disease consultation this week. 06/16/2017 -- the patient was seen today by the nurse practitioner at infectious disease Ms. Dixon. 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 He has also been advise Lotrimin cream locally. she also had wise IV antibiotics for his condition if it flares up 06/23/17; patient arrives today with drainage bilaterally although the remaining wound on the left posterior calf after cleaning up today "highlighter yellow drainage" did not look too bad. Unfortunately he has had breakdown on the right anterior leg [previously this leg had not been open and he is using a black stocking] he went to see infectious disease and is been put on clindamycin 150 mg daily, I did not verify the dose although I'm not familiar with using clindamycin in this dosing range, perhaps for prophylaxisoo 06/27/17; I brought this patient back today to follow-up on the wound deterioration on the right lower leg together with surrounding cellulitis. I started him on doxycycline 4 days ago. This area looks better however he comes in today with intense cellulitis on the medial part of his left thigh. This is not have a wound in this area. Extremely tender. We've been using silver  alginate to the wounds on the right lower leg left lower leg with bilateral 4 layer compression he is using his external compression pumps once a day 07/04/17; patient's left medial thigh cellulitis looks better. He has not been using his compression pumps as his insert said it was contraindicated with cellulitis. His right leg continues to make improvements all the wounds are still open.  We only have one remaining wound on the left posterior calf. Using silver alginate to all open areas. He is on doxycycline which I started a week ago and should be finishing I gave him Augmentin after Thursday's visit for the severe cellulitis on the left medial thigh which fortunately looks better 07/14/17; the patient's left medial thigh cellulitis has resolved. The cellulitis in his right lower calf on the right also looks better. All of his wounds are stable to improved we've been using silver alginate he has completed the antibiotics I have given him. He has clindamycin 150 mg once a day prescribed by infectious disease for prophylaxis, I've advised him to start this now. We have been using bilateral Unna boots over silver alginate to the wound areas 07/21/17; the patient is been to see infectious disease who noted his recurrent problems with cellulitis. He was not able to tolerate prophylactic clindamycin therefore he is on amoxicillin 500 twice a day. He also had a second daily dose of Lasix added By Dr. Oneta Rack but he is not taking this. Nor is he being completely compliant with his compression pumps a especially not this week. He has 2 remaining wounds one on the right posterior lateral lower leg and one on the left posterior medial lower leg. 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 The patient has Unna boots bilaterally. 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 He has bilateral Unna boots for compression and silver alginate to his wounds. Arrives today with his legs looking as good as I have seen him in quite some time. Not surprisingly his wounds look better as well with improvement on the right lateral leg venous insufficiency wound and also the left medial leg. He  is still using the compression pumps once a day 08/11/17; both legs appear to be doing better wounds on the right lateral and left medial legs look better. Skin on the right leg quite good. He is been using silver alginate as the primary dressing. I'm going to use Anasept gel calcium alginate and maintain all the secondary dressings 08/18/17; the patient continues to actually do quite well. The area on his right lateral leg is just about closed the left medial also looks better although it is still moist in this area. His edema is well controlled we have been using Anasept gel with calcium alginate and the usual secondary dressings, 4 layer compression and once daily use of his compression pumps "always been able to manage 09/01/17; the patient continues to do reasonably well in spite of his trip to [place]. The area on the right lateral leg is epithelialized. Left is much better but still open. He has more edema and more chronic erythema on the left leg [venous inflammation] 09/08/17; he arrives today with no open wound on the right lateral leg and decently controlled edema. Unfortunately his left leg is not nearly as in his  good situation as last week.he apparently had increasing edema starting on Saturday. He edema soaked through into his foot so used a plastic bag to walk around his home. The area on the medial right leg which was his open area is about the same however he has lost surface epithelium on the left lateral which is new and he has significant pain in the Achilles area of the left foot. He is already on amoxicillin chronically for prophylaxis of cellulitis in the left leg 09/15/17; he is completed a week of doxycycline and the cellulitis in the left posterior leg and Achilles area is as usual improved. He still has a lot of edema and fluid soaking through his dressings. There is no open wound on the right leg. He saw infectious disease NP today 09/22/17;As usual 1 we transition him  from our compression wraps to his stockings things did not go well. He has several small open areas on the right leg. He states this was caused by the compression wrap on his skin although he did not wear this with the stockings over them. He has several superficial areas on the left leg medially laterally posteriorly. He does not have any evidence of active cellulitis especially involving the left Achilles The patient is traveling from [facility] Saturday going to [facility]. He states he isn't attempting to get an appointment with a heel objects wound center there to change his dressings. I am not completely certain whether this will work 10/06/17; the patient came in on Friday for a nurse visit and the nurse reported that his legs actually look quite good. He arrives in clinic today for his regular follow-up visit. He has a new wound on his left third toe over the PIP probably caused by friction with his footwear. He has small areas on the left leg and a very superficial but epithelialized area on the right anterior lateral lower leg. Other than that his legs look as good as I've seen him in quite some time. We have been using silver alginate Review of systems; no chest pain no shortness of breath other than this a 10 point review of systems negative 10/20/17; seen by Dr. Meyer Russel last week. He had taken some antibiotics [doxycycline] that he had left over. Dr. Meyer Russel thought he had candida infection and declined to give him further antibiotics. He has a small wound remaining on the right lateral leg several areas on the left leg including a larger area on the left posterior several left medial and anterior and a small wound on the left lateral. The area on the left dorsal third toe looks a lot better. ROS; Gen.; no fever, respiratory no cough no sputum Cardiac no chest pain other than this 10 point review of system is negative 10/30/17; patient arrives today having fallen in the bathtub 3 days ago.  It took him a while to get up. He has pain and maceration in the wounds on his left leg which have deteriorated. He has not been using his pumps he also has some maceration on the right lateral leg. 11/03/17; patient continues to have weeping edema especially in the left leg. This saturates his dressings which were just put on on 12/27. As usual the doxycycline seems to take care of the cellulitis on his lower leg. He is not complaining of fever, chills, or other systemic symptoms. He states his leg feels a lot better on the doxycycline I gave him empirically. He also apparently gets injections at his primary  doctor's officeo Rocephin for cellulitis prophylaxis. I didn't ask him about his compression pump compliance today I think that's probably marginal. Arrives in the clinic with all of his dressings primary and secondary macerated full of fluid and he has bilateral edema 11/10/17; the patient's right leg looks some better although there is still a cluster of wounds on the right lateral. The left leg is inflamed with almost circumferential skin loss medially to laterally although we are still maintaining anteriorly. He does not have overt cellulitis there is a lot of drainage. He is not using compression pumps. We have been using silver alginate to the wound areas, there are not a lot of options here 11/17/17; the patient's right leg continues to be stable although there is still open wounds, better than last week. The inflammation in the left leg is better. Still loss of surface layer epithelium especially posteriorly. There is no overt cellulitis in the amount of edema and his left leg is really quite good, tells me he is using his compression pumps once a day. 11/24/17; patient's right leg has a small superficial wound laterally this continues to improve. The inflammation in the left leg is still improving however we have continuous surface layer epithelial loss posteriorly. There is no overt  cellulitis in the amount of edema in both legs is really quite good. He states he is using his compression pumps on the left leg once a day for 5 out of 7 days 12/01/17; very small superficial areas on the right lateral leg continue to improve. Edema control in both legs is better today. He has continued loss of surface epithelialization and left posterior calf although I think this is better. We have been using silver alginate with large number of absorptive secondary dressings 4 layer on the left Unna boot on the right at his request. He tells me he is using his compression pumps once a day 12/08/17; he has no open area on the right leg is edema control is good here. ooOn the left leg however he has marked erythema and tenderness breakdown of skin. He has what appears to be a wrap injury just distal to the popliteal fossa. 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 There was swelling erythema and breakdown in the right leg. He has much worse skin breakdown in the left leg as well multiple open areas medially and posteriorly as well as laterally. He tells me he has been using his compression pumps but tells me he feels that the drainage out of his leg is worse when he uses a compression pumps. T be fair to him he is been saying this o for a while however I don't know that I have really been listening to this. I wonder if the compression pumps are working properly 12/22/17;. Once again he arrives with severe erythema, weeping edema from the left greater than right leg. Noncompliance with compression pumps. New this visit he is complaining of pain on the lateral aspect of the right leg and the medial aspect of his right thigh. He  apparently saw his cardiologist Dr. [name] who was ordered an echocardiogram area and I think this is a step in the right direction 12/25/17; started his doxycycline Monday night.  There is still intense erythema of the right leg especially in the anterior thigh although there is less tenderness. The erythema around the wound on the right lateral calf also is less tender. He still complaining of pain in the left heel. His wounds are about the same right lateral left medial left lateral. Superficial but certainly not close to closure. He denies being systemically unwell no fever chills no abdominal pain no diarrhea 12/29/17; back in follow-up of his extensive right calf and right thigh cellulitis. I added amoxicillin to cover possible doxycycline resistant strep. This seems to of done the trick he is in much less pain there is much less erythema and swelling. He has his echocardiogram at 11:00 this morning. X-ray of the left heel was also negative. 01/05/18; the patient arrived with his edema under much better control. Now that he is retired he is able to use his compression pumps daily and sometimes twice a day per the patient. He has a wound on the right leg the lateral wound looks better. Area on the left leg also looks a lot better. He has no evidence of cellulitis in his bilateral thighs I had a quick peak at his echocardiogram. He is in normal ejection fraction and normal left ventricular function. He has moderate pulmonary hypertension moderately reduced right ventricular function. One would have to wonder about chronic sleep apnea although he says he doesn't snore. He'll review the echocardiogram with his cardiologist. 01/12/18; the patient arrives with the edema in both legs under exemplary control. He is using his compression pumps daily and sometimes twice daily. His wound on the right lateral leg is just about closed. He still has some weeping areas on the posterior left calf and lateral left calf  although everything is just about closed here as well. I have spoken with Aldean Baker who is the patient's nurse practitioner and infectious disease. She was concerned that the patient had not understood that the parenteral penicillin injections he was receiving for cellulitis prophylaxis was actually benefiting him. I don't think the patient actually saw that I would tend to agree we were certainly dealing with less infections although he had a serious one last month. 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 He is healed. He'll be placed in juxtalite compression wraps and increase his lymphedema pumps to twice daily. We will follow up again next week to ensure there are no issues with the new regiment. 01/20/18-he is here for evaluation of bilateral lower extremity weeping edema. Yesterday he was placed in compression wrap to the right lower extremity and compression stocking to left lower shrubbery. He states he uses lymphedema pumps last night and again this morning and noted a blister to the left lower extremity. On exam he was noted to have drainage to the right lower extremity. He will be placed in Unna boots bilaterally and follow-up next week 01/26/18; patient was actually discharged a week ago to his own juxta light stockings only to return the next day with bilateral lower extremity weeping edema.he was placed in bilateral Unna boots. He arrives today with pain in the back of his left leg. There is no open area on the right leg however there is a linear/wrap injury on the left leg and weeping edema on the left leg posteriorly. I spoke with infectious disease about 10 days ago. They were disappointed that the patient elected to discontinue prophylactic intramuscular penicillin shots as they felt it was particularly beneficial in  reducing the frequency of his cellulitis. I discussed this with the patient today. He does not share this view. He'll  definitely need antibiotics today. Finally he is traveling to [place] and trauma leaving this Saturday and returning a week later and he does not travel with his pumps. He is going by car 01/30/18; patient was seen 4 days ago and brought back in today for review of cellulitis in the left leg posteriorly. I put him on amoxicillin this really hasn't helped as much as I might like. He is also worried because he is traveling to [facility] by car. Finally we will be rewrapping him. There is no open area on the right leg over his left leg has multiple weeping areas as usual 02/09/18; The same wrap on for 10 days. He did not pick up the last doxycycline I prescribed for him. He apparently took 4 days worth he already had. There is nothing open on his right leg and the edema control is really quite good. He's had damage in the left leg medially and laterally especially probably related to the prolonged use of Unna boots 02/12/18; the patient arrived in clinic today for a nurse visit/wrap change. He complained of a lot of pain in the left posterior calf. He is taking doxycycline that I previously prescribed for him. Unfortunately even though he used his stockings and apparently used to compression pumps twice a day he has weeping edema coming out of the lateral part of his right leg. This is coming from the lower anterior lateral skin area. 02/16/18; the patient has finished his doxycycline and will finish the amoxicillin 2 days. The area of cellulitis in the left calf posteriorly has resolved. He is no longer having any pain. He tells me he is using his compression pumps at least once a day sometimes twice. 02/23/18; the patient finished his doxycycline and Amoxil last week. On Friday he noticed a small erythematous circle about the size of a quarter on the left lower leg just above his ankle. This rapidly expanded and he now has erythema on the lateral and posterior part of the thigh. This is bright red.  Also has an area on the dorsal foot just above his toes and a tender area just below the left popliteal fossa. He came off his prophylactic penicillin injections at his own insistence one or 2 months ago. This is obviously deteriorated since then 03/02/18; patient is on doxycycline and Amoxil. Culture I did last week of the weeping area on the back of his left calf grew group B strep. I have therefore renewed the amoxicillin 500 3 times a day for a further week. He has not been systemically unwell. Still complaining of an area of discomfort right under his left popliteal fossa. There is no open wound on the right leg. He tells me that he is using his pumps twice a day on most days 03/09/18; patient arrives in clinic today completing his amoxicillin today. The cellulitis on his left leg is better. Furthermore he tells me that he had intramuscular penicillin shots that his primary care office today. However he also states that the wrap on his right leg fell down shortly after leaving clinic last week. He developed a large blister that was present when he came in for a nurse visit later in the week and then he developed intense discomfort around this area.He tells me he is using his compression pumps 03/16/18; the patient has completed his doxycycline. The infectious part of  this/cellulitis in the left heel area left popliteal area is a lot better. He has 2 open areas on the right calf. Still areas on the left calf but this is a lot better as well. 03/24/18; the patient arrives complaining of pain in the left popliteal area again. He thinks some of this is wrap injury. He has no open area on the right leg and really no open area on the left calf either except for the popliteal area. He claims to be compliant with the compression pumps 03/31/18; I gave him doxycycline last week because of cellulitis in the left popliteal area. This is a lot better although the surface epithelium is denuded off and response to  this. He arrives today with uncontrolled edema in the right calf area as well as a fingernail injury in the right lateral calf. There is only a few open areas on the left 04/06/18; I gave him amoxicillin doxycycline over the last 2 weeks that the amoxicillin should be completing currently. He is not complaining of any pain or systemic symptoms. The only open areas see has is on the right lateral lower leg paradoxically I cannot see anything on the left lower leg. He tells me he is using his compression pumps twice a day on most days. Silver alginate to the wounds that are open under 4 layer compression 04/13/18; he completed antibiotics and has no new complaints. Using his compression pumps. Silver alginate that anything that's opened 04/20/18; he is using his compression pumps religiously. Silver alginate 4 layer compression anything that's opened. He comes in today with no open wounds on the left leg but 3 on the right including a new one posteriorly. He has 2 on the right lateral and one on the right posterior. He likes Unna boots on the right leg for reasons that aren't really clear we had the usual 4 layer compression on the left. It may be necessary to move to the 4 layer compression on the right however for now I left them in the Unna boots 04/27/18; he is using his compression pumps at least once a day. He has still the wounds on the right lateral calf. The area right posteriorly has closed. He does not have an open wound on the left under 4 layer compression however on the dorsal left foot just proximal to the toes and the left third toe 2 small open areas were identified 05/11/18; he has not uses compression pumps. The areas on the right lateral calf have coalesced into one large wound necrotic surface. On the left side he has one small wound anteriorly however the edema is now weeping out of a large part of his left leg. He says he wasn't using his pumps because of the weeping fluid. I explained  to him that this is the time he needs to pump more 05/18/18; patient states he is using his compression pumps twice a day. The area on the right lateral large wound albeit superficial. On the left side he has innumerable number of small new wounds on the left calf particularly laterally but several anteriorly and medially. All these appear to have healthy granulated base these look like the remnants of blisters however they occurred under compression. The patient arrives in clinic today with his legs somewhat better. There is certainly less edema, less multiple open areas on the left calf and the right anterior leg looks somewhat better as well superficial and a little smaller. However he relates pain and erythema over the  last 3-4 days in the thigh and I looked at this today. He has not been systemically unwell no fever no chills no change in blood sugar values 05/25/18; comes in today in a better state. The severe cellulitis on his left leg seems better with the Keflex. Not as tender. He has not been systemically unwell ooHard to find an open wound on the left lower leg using his compression pumps twice a day ooThe confluent wounds on his right lateral calf somewhat better looking. These will ultimately need debridement I didn't do this today. 06/01/18; the severe cellulitis on the left anterior thigh has resolved and he is completed his Keflex. ooThere is no open wound on the left leg however there is a superficial excoriation at the base of the third toe dorsally. Skin on the bottom of his left foot is macerated looking. ooThe left the wounds on the lateral right leg actually looks some better although he did require debridement of the top half of this wound area with an open curet 06/09/18 on evaluation today patient appears to be doing poorly in regard to his right lower extremity in particular this appears to likely be infected he has very thick purulent discharge along with a bright green tent  to the discharge. This makes me concerned about the possibility of pseudomonas. He's also having increased discomfort at this point on evaluation. Fortunately there does not appear to be any evidence of infection spreading to the other location at this time. 06/16/18 on evaluation today patient appears to actually be doing fairly well. His ulcer has actually diminished in size quite significantly at this point which is good news. Nonetheless he still does have some evidence of infection he did see infectious disease this morning before coming here for his appointment. I did review the results of their evaluation and their note today. They did actually have him discontinue the Cipro and initiate treatment with linezolid at this time. He is doing this for the next seven days and they recommended a follow-up in four months with them. He is the keep a log of the need for intermittent antibiotic therapy between now and when he falls back up with infectious disease. This will help them gaze what exactly they need to do to try and help them out. 06/23/18; the patient arrives today with no open wounds on the left leg and left third toe healed. He is been using his compression pumps twice a day. On the right lateral leg he still has a sizable wound but this is a lot better than last time I saw this. 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 Has been using silver alginate under 4 layer compression 06/30/18; the only open wound he has is on the right lateral leg and this looks healthy. No debridement is required. We have been using silver alginate. He does not have an open wound on the left leg. There is apparently some drainage from the dorsal proximal third toe on the left although I see no open wound here. 07/03/18 on evaluation today patient was actually here just for a nurse visit rapid change. However when he  was here on Wednesday for his rat change due to having been healed on the left and then developing blisters we initiated the wrap again knowing that he would be back today for [place] to reevaluate and see were at. Unfortunately he has  developed some cellulitis into the proximal portion of his right lower extremity even into the region of his thigh. 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 He was placed on one as what at that point. Nonetheless he is done with that and has been tolerating it well otherwise. Doxycycline which in the past really did not seem to be effective for him. Nonetheless I think the best option may be for [place] to definitely reinitiate the antibiotics for a longer period of time. 07/07/18; since I last saw this patient a week ago he has had a difficult time. At that point he did not have an open wound on his left leg. We transitioned him into juxta light stockings. He was apparently in the clinic the next day with blisters on the left lateral and left medial lower calf. He also had weeping edema fluid. He was put back into a compression wrap. He was also in the clinic on Friday with intense erythema in his right thigh. Per the patient he was started on Bactrim however that didn't work at all in terms of relieving his pain and swelling. He has taken 3 doxycycline that he had left over from last time and that seems to of helped. He has blistering on the right thigh as well. 07/14/18; the erythema on his right thigh has gotten better with doxycycline that he is finishing. 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 Presumably this cellulitis in the thigh was not related to that although I think this is a bit concerning going forward. He still has an area on the right lateral calf the blister on the right medial calf just below the knee that was discussed above. On the left 2  small open areas left medial and left lateral. Edema control is adequate. He is using his compression pumps twice a day 07/20/18; continued improvement in the condition of both legs especially the edema in his bilateral thighs. He tells me he is been losing weight through a combination of diet and exercise. He is using his compression pumps twice a day. So overall she made to the remaining wounds 07/27/2018; continued improvement in condition of both legs. His edema is well controlled. The area on the right lateral leg is just about closed he had one blisters show up on the medial left upper calf. We have him in 4 layer compression. He is going on a 10-day trip to [place], [place] and [place]. He will be driving. He wants to wear Unna boots because of the lessening amount of constriction. He will not use compression pumps while he is away 08/05/18 on evaluation today patient actually appears to be doing decently well all things considered in regard to his bilateral lower extremities. The worst ulcer is actually only posterior aspect of his left lower extremity with a four layer compression wrap cut into his leg a couple weeks back. He did have a trip and actually had [employer] for the trip that he is worn since he was last here. Nonetheless he feels like the [employer] actually do better for him his swelling is up a little bit but he also with his trip was not taking his Lasix on a regular set schedule like he was supposed to be. He states that obviously the reason being that he cannot drive and keep going without  having to urinate too frequently which makes it difficult. He did not have his pumps with him while he was away either which I think also maybe playing a role here too. 08/13/2018; the patient only has a small open wound on the right lateral calf which is a big improvement in the last month or 2. He also has the area posteriorly just below the posterior fossa on the left which  I think was a wrap injury from several weeks ago. He has no current evidence of cellulitis. He tells me he is back into his compression pumps twice a day. He also tells me that while he was at the laundromat somebody stole a section of his extremitease stockings 08/20/2018; back in the clinic with a much improved state. He only has small areas on the right lateral mid calf which is just about healed. This was is more substantial area for quite a prolonged period of time. He has a small open area on the left anterior tibia. The area on the posterior calf just below the popliteal fossa is closed today. He is using his compression pumps twice a day 08/28/2018; patient has no open wound on the right leg. He has a smattering of open areas on the calf with some weeping lymphedema. More problematically than that it looks as though his wraps of slipped down in his usual he has very angry upper area of edema just below the right medial knee and on the right lateral calf. He has no open area on his feet. The patient is traveling to [facility] next week. I will send him in an antibiotic. We will continue to wrap the right leg. We ordered extremitease stockings for him last week and I plan to transition the right leg to a stocking when he gets home which will be in 10 days time. As usual he is very reluctant to take his pumps with him when he travels 09/07/2018; patient returns from [facility]. He shows me a picture of his left leg in the mid part of his trip last week with intense fire engine erythema. The picture look bad enough I would have considered sending him to the hospital. Instead he went to the wound care center in [facility]. They did not prescribe him antibiotics but he did take some doxycycline he had leftover from a previous visit. I had given him trimethoprim sulfamethoxazole before he left this did not work according to the patient. This is resulted in some improvement  fortunately. He comes back with a large wound on the left posterior calf. Smaller area on the left anterior tibia. Denuded blisters on the dorsal left foot over his toes. Does not have much in the way of wounds on the right leg although he does have a very tender area on the right posterior area just below the popliteal fossa also suggestive of infection. He promises me he is back on his pumps twice a day 09/15/2018; the intense cellulitis in his left lower calf is a lot better. The wound area on the posterior left calf is also so better. However he has reasonably extensive wounds on the dorsal aspect of his second and third toes and the proximal foot just at the base of the toes. There is nothing open on the right leg 09/22/2018; the patient has excellent edema control in his legs bilaterally. He is using his external compression pumps twice a day. He has no open area on the right leg and only the  areas in the left foot dorsally second and third toe area on the left side. He does not have any signs of active cellulitis. 10/06/2018; the patient has good edema control bilaterally. He has no open wound on the right leg. There is a blister in the posterior aspect of his left calf that we had to deal with today. He is using his compression pumps twice a day. There is no signs of active cellulitis. We have been using silver alginate to the wound areas. He still has vulnerable areas on the base of his left first second toes dorsally He has a his extremities stockings and we are going to transition him today into the stocking on the right leg. He is cautioned that he will need to continue to use the compression pumps twice a day. If he notices uncontrolled edema in the right leg he may need to go to 3 times a day. 10/13/2018; the patient came in for a nurse check on Friday he has a large flaccid blister on the right medial calf just below the knee. We unroofed this. He has this and a new area underneath the  posterior mid calf which was undoubtedly a blister as well. He also has several small areas on the right which is the area we put his extremities stocking on. 10/19/2018; the patient went to see infectious disease this morning I am not sure if that was a routine follow-up in any case the doxycycline I had given him was discontinued and started on linezolid. He has not started this. It is easy to look at his left calf and the inflammation and think this is cellulitis however he is very tender in the tissue just below the popliteal fossa and I have no doubt that there is infection going on here. He states the problem he is having is that with the compression pumps the edema goes down and then starts walking the wrap falls down. We will see if we can adhere this. He has 1 or 2 minuscule open areas on the right still areas that are weeping on the posterior left calf, the base of his left second and third toes 10/26/18; back today in clinic with quite of skin breakdown in his left anterior leg. This may have been infection the area below the popliteal fossa seems a lot better however tremendous epithelial loss on the left anterior mid tibia area over quite inexpensive tissue. He has 2 blisters on the right side but no other open wound here. 10/29/2018; came in urgently to see [place] today and we worked him in for review. He states that the 4 layer compression on the right leg caused pain he had to cut it down to roughly his mid calf this caused swelling above the wrap and he has blisters and skin breakdown today. As a result of the pain he has not been using his pumps. Both legs are a lot more edematous and there is a lot of weeping fluid. 11/02/18; arrives in clinic with continued difficulties in the right leg> left. Leg is swollen and painful. multiple skin blisters and new open areas especially laterally. He has not been using his pumps on the right leg. 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 He is not systemically unwell. 11/09/2018; the patient claims he is being compliant with his pumps. He is finished the doxycycline I gave him last week. Culture I did of the wound on the right lateral leg  showed a few very resistant methicillin staph aureus. This was resistant to doxycycline. Nevertheless he states the pain in the leg is a lot better which makes me wonder if the cultured organism was not really what was causing the problem nevertheless this is a very dangerous organism to be culturing out of any wound. His right leg is still a lot larger than the left. He is using an Radio broadcast assistant on this area, he blames a 4-layer compression for causing the original skin breakdown which I doubt is true however I cannot talk him out of it. We have been using silver alginate to all of these areas which were initially blisters 11/16/2018; patient is being compliant with his external compression pumps at twice a day. Miraculously he arrives in clinic today with absolutely no open wounds. He has better edema control on the left where he has been using 4 layer compression versus wound of wounds on the right and I pointed this out to him. There is no inflammation in the skin in his lower legs which is also somewhat unusual for him. There is no open wounds on the dorsal left foot. He has extremitease stockings at home and I have asked him to bring these in next week. 11/25/18 patient's lower extremity on examination today on the left appears for the most part to be wound free. He does have an open wound on the lateral aspect of the right lower extremity but this is minimal compared to what I've seen in past. He does request that we go ahead and wrap the left leg as well even though there's nothing open just so hopefully it will not reopen in short order. 1/28; patient has superficial open wounds on the right lateral calf left anterior calf and left posterior calf. His edema  control is adequate. 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 We have been using silver alginate as the primary dressing. He claims compliance with his compression pumps 2/4; patient has superficial open wounds on numerous areas of his left calf and again one on the left dorsal foot. The areas on the right lateral calf have healed. The cellulitis that I gave him doxycycline for last week is also resolved this was mostly on the left anterior calf just below the tibial tuberosity. His edema looks fairly well-controlled. He tells me he went to see his primary doctor today and had blood work ordered 2/11; once again he has several open areas on the left calf left tibial area. Most of these are small and appear to have healthy granulation. He does not have anything open on the right. The edema and control in his thighs is pretty good which is usually a good indication he has been using his pumps as requested. 2/18; he continues to have several small areas on the left calf and left tibial area. Most of these are small healthy granulation. We put him in his stocking on the right leg last week and he arrives with a superficial open area over the right upper tibia and a fairly large area on the right lateral tibia in similar condition. His edema control actually does not look too bad, he claims to be using his compression pumps twice a day 2/25. Continued small areas on the left calf and left tibial area. New areas especially on the right are identified just below the tibial tuberosity and on the right upper tibia itself.  There are also areas of weeping edema fluid even without an obvious wound. He does not have a considerable degree of lymphedema but clearly there is more edema here than his skin can handle. He states he is using the pumps twice a day. We have an Unna boot on the right and 4 layer compression on the  left. 3/3; he continues to have an area on the right lateral calf and right posterior calf just below the popliteal fossa. There is a fair amount of tenderness around the wound on the popliteal fossa but I did not see any evidence of cellulitis, could just be that the wrap came down and rubbed in this area. ooHe does not have an open area on the left leg however there is an area on the left dorsal foot at the base of the third toe ooWe have been using silver alginate to all wound areas 3/10; he did not have an open area on his left leg last time he was here a week ago. T oday he arrives with a horizontal wound just below the tibial tuberosity and an area on the left lateral calf. He has intense erythema and tenderness in this area. The area is on the right lateral calf and right posterior calf better than last week. We have been using silver alginate as usual 3/18 - Patient returns with 3 small open areas on left calf, and 1 small open area on right calf, the skin looks ok with no significant erythema, he continues the UNA boot on right and 4 layer compression on left. The right lateral calf wound is closed , the right posterior is small area. we will continue silver alginate to the areas. 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 He is been tolerating the dressing changes without complication. Fortunately she has made excellent improvement in regard to the overall status of his wounds. Unfortunately every time we cease wrapping him he ends up reopening in causing more significant issues at that point. Again I'm unsure of the best direction to take although I think the lymphedema clinic may be appropriate for him. 02/03/19 on evaluation today patient appears to be doing well in regard to the wounds that we  saw him for last week unfortunately he has a new area on the proximal portion of his right medial/posterior lower extremity where the wrap somewhat slowed down and caused swelling and a blister to rub and open. Unfortunately this is the only opening that he has on either leg at this point. 02/17/19 on evaluation today patient's bilateral lower extremities appear to be doing well. He still completely healed in regard to the left lower extremity. In regard to the right lower extremity the area where the wrap and slid down and caused the blister still seems to be slightly open although this is dramatically better than during the last evaluation two weeks ago. I'm very pleased with the way this stands overall. 03/03/19 on evaluation today patient appears to be doing well in regard to his right lower extremity in general although he did have a new blister open this does not appear to be showing any evidence of active infection at this time. Fortunately there's No fevers, chills, nausea, or vomiting noted at this time. Overall I feel like he is making good progress it does  feel like that the right leg will we perform the [employer] seems to do with a bit better than three layer wrap on the left which slid down on him. We may switch to doing bilateral in the book wraps. 5/4; I have not seen Mr. Trapani in quite some time. According to our case manager he did not have an open wound on his left leg last week. He had 1 remaining wound on the right posterior medial calf. He arrives today with multiple openings on the left leg probably were blisters and/or wrap injuries from Unna boots. I do not think the Unna boot's will provide adequate compression on the left. I am also not clear about the frequency he is using the compression pumps. 03/17/19 on evaluation today patient appears to be doing excellent in regard to his lower extremities compared to last week's evaluation apparently. He had gotten significantly worse  last week which is unfortunate. The [employer] wrap on the left did not seem to do very well for him at all and in fact it didn't control his swelling significantly enough he had an additional outbreak. Subsequently we go back to the four layer compression wrap on the left. This is good news. At least in that he is doing better and the wound seem to be killing him. He still has not heard anything from the lymphedema clinic. 03/24/19 on evaluation today patient actually appears to be doing much better in regard to his bilateral lower Trinity as compared to last week when I saw him. Fortunately there's no signs of active infection at this time. He has been tolerating the dressing changes without complication. Overall I'm extremely pleased with the progress and appearance in general. 04/07/19 on evaluation today patient appears to be doing well in regard to his bilateral lower extremities. His swelling is significantly down from where it was previous. With that being said he does have a couple blisters still open at this point but fortunately nothing that seems to be too severe and again the majority of the larger openings has healed at this time. 04/14/19 on evaluation today patient actually appears to be doing quite well in regard to his bilateral lower extremities in fact I'm not even sure there's anything significantly open at this time at any site. Nonetheless he did have some trouble with these wraps where they are somewhat irritating him secondary to the fact that he has noted that the graph wasn't too close down to the end of this foot in a little bit short as well up to his knee. Otherwise things seem to be doing quite well. 04/21/19 upon evaluation today patient's wound bed actually showed evidence of being completely healed in regard to both lower extremities which is excellent news. There does not appear to be any signs of active infection which is also good news. I'm very pleased in this regard. No  fevers, chills, nausea, or vomiting noted at this time. 04/28/19 on evaluation today patient appears to be doing a little bit worse in regard to both lower extremities on the left mainly due to the fact that when he went infection disease the wrap was not wrapped quite high enough he developed a blister above this. On the right he is a small open area of nothing too significant but again this is continuing to give him some trouble he has been were in the Velcro compression that he has at home. 05/05/19 upon evaluation today patient appears to be doing better with  regard to his lower Trinity ulcers. He's been tolerating the dressing changes without complication. Fortunately there's no signs of active infection at this time. No fevers, chills, nausea, or vomiting noted at this time. We have been trying to get an appointment with her lymphedema clinic in [facility] but unfortunately nobody can get them on phone with not been able to even fax information over the patient likewise is not been able to get in touch with them. Overall I'm not sure exactly what's going on here with to reach out again today. 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 Still having a lot of drainage unfortunately. He tells me especially in the left but even on the right. There's no signs of active infection which is good news we've been using so ratcheted up to this point. 05/19/19 on evaluation today patient actually appears to be doing quite well with regard to his left lower extremity which is great news. Fortunately in regard to the right lower extremity has an issues with his wrap and he subsequently did remove this from what I'm understanding. Nonetheless long story short is what he had rewrapped once he removed it subsequently had maggots underneath this wrap whenever he came in for evaluation today. With that being said  they were obviously completely cleaned away by the nursing staff. The visit today which is excellent news. However he does appear to potentially have some infection around the right ankle region where the maggots were located as well. He will likely require anabiotic therapy today. 05/26/19 on evaluation today patient actually appears to be doing much better in regard to his bilateral lower extremities. I feel like the infection is under much better control. With that being said there were maggots noted when the wrap was removed yet again today. Again this could have potentially been left over from previous although at this time there does not appear to be any signs of significant drainage there was obviously on the wrap some drainage as well this contracted gnats or otherwise. Either way I do not see anything that appears to be doing worse in my pinion and in fact I think his drainage has slowed down quite significantly likely mainly due to the fact to his infection being under better control. 06/02/2019 on evaluation today patient actually appears to be doing well with regard to his bilateral lower extremities there is no signs of active infection at this time which is great news. With that being said he does have several open areas more so on the right than the left but nonetheless these are all significantly better than previously noted. 06/09/2019 on evaluation today patient actually appears to be doing well. His wrap stayed up and he did not cause any problems he had more drainage on the right compared to the left but overall I do not see any major issues at this time which is great news. 06/16/2019 on evaluation today patient appears to be doing excellent with regard to his lower extremities the only area that is open is a new blister that can have opened as of today on the medial ankle on the left. Other than this he really seems to be doing great I see no major issues at this point. 06/23/2019 on  evaluation today patient appears to be doing quite well with regard to his bilateral lower extremities. In fact he actually appears to be almost completely healed there  is a small area of weeping noted of the right lower extremity just above the ankle. Nonetheless fortunately there is no signs of active infection at this time which is good news. No fevers, chills, nausea, vomiting, or diarrhea. 8/24; the patient arrived for a nurse visit today but complained of very significant pain in the left leg and therefore I was asked to look at this. Noted that he did not have an open area on the left leg last week nevertheless this was wrapped. The patient states that he is not been able to put his compression pumps on the left leg because of the discomfort. He has not been systemically unwell 06/30/2019 on evaluation today patient unfortunately despite being excellent last week is doing much worse with regard to his left lower extremity today. In fact he had to come in for a nurse on Monday where his left leg had to be rewrapped due to excessive weeping Dr. Leanord Hawking placed him on doxycycline at that point. Fortunately there is no signs of active infection Systemically at this time which is good news. 07/07/2019 in regard to the patient's wounds today he actually seems to be doing well with his right lower extremity there really is nothing open or draining at this point this is great news. Unfortunately the left lower extremity is given him additional trouble at this time. There does not appear to be any signs of active infection nonetheless he does have a lot of edema and swelling noted at this point as well as blistering all of which has led to a much more poor appearing leg at this time compared to where it was 2 weeks ago when it was almost completely healed. Obviously this is a little discouraging for the patient. He is try to contact the lymphedema clinic in [place] he has not been able to get through to  them. 07/14/2019 on evaluation today patient actually appears to be doing slightly better with regard to his left lower extremity ulcers. Overall I do feel like at least at the top of the wrap that we have been placing this area has healed quite nicely and looks much better. The remainder of the leg is showing signs of improvement. Unfortunately in the thigh area he still has an open region on the left and again on the right he has been utilizing just a Band-Aid on an area that also opened on the thigh. Again this is an area that were not able to wrap although we did do an Ace wrap to provide some compression that something that obviously is a little less effective than the compression wraps we have been using on the lower portion of the leg. He does have an appointment with the lymphedema clinic in [facility] on Friday. 07/21/2019 on evaluation today patient appears to be doing better with regard to his lower extremity ulcers. He has been tolerating the dressing changes without complication. Fortunately there is no signs of active infection at this time. No fevers, chills, nausea, vomiting, or diarrhea. I did receive the paperwork from the physical therapist at the lymphedema clinic in [place]. Subsequently I signed off on that this morning and sent that back to him for further progression with the treatment plan. 07/28/2019 on evaluation today patient appears to be doing very well with regard to his right lower extremity where I do not see any open wounds at this point. Fortunately he is feeling great as far as that is concerned as well. In regard to the left  lower extremity he has been having issues with still several areas of weeping and edema although the upper leg is doing better his lower leg still I think is going require the compression wrap at this time. No fevers, chills, nausea, vomiting, or diarrhea. 08/04/2019 on evaluation today patient unfortunately is having new wounds on the  right lower extremity. Again we have been using Unna boot wrap on that side. We switched him to using his juxta lite wrap at home. With that being said he tells me he has been using it although his legs extremely swollen and to be honest really does not appear that he has been. I cannot know that for sure however. Nonetheless he has multiple new wounds on the right lower extremity at this time. Obviously we will have to see about getting this rewrapped for him today. 08/11/2019 on evaluation today patient appears to be doing fairly well with regard to his wounds. He has been tolerating the dressing changes including the compression wraps without complication. He still has a lot of edema in his upper thigh regions bilaterally he is supposed to be seeing the lymphedema clinic on the 15th of this month once his wraps arrive for the upper part of his legs. 08/18/2019 on evaluation today patient appears to be doing well with regard to his bilateral lower extremities at this point. He has been tolerating the dressing changes without complication. Fortunately there is no signs of active infection which is also good news. He does have a couple weeping areas on the first and second toe of the right foot he also has just a small area on the left foot upper leg and a small area on the left lower leg but overall he is doing quite well in my opinion. He is supposed to be getting his wraps shortly in fact tomorrow and then subsequently is seeing the lymphedema clinic next Wednesday on the 21st. Of note he is also leaving on the 25th to go on vacation for a week to the beach. For that reason and since there is some uncertainty about what there can be doing at lymphedema clinic next Wednesday I am get a make an appointment for next Friday here for [place] to see what we need to do for him prior to him leaving for vacation. 10/23; patient arrives in considerable pain predominantly in the upper posterior calf just distal to  the popliteal fossa also in the wound anteriorly above the major wound. This is probably cellulitis and he has had this recurrently in the past. He has no open wound on the right side and he has had an Radio broadcast assistant in that area. Finally I note that he has an area on the left posterior calf which by enlarge is mostly epithelialized. This protrudes beyond the borders of the surrounding skin in the setting of dry scaly skin and lymphedema. The patient is leaving for [facility] on Sunday. Per his longstanding pattern, he will not take his compression pumps with him predominantly out of fear that they will be stolen. He therefore asked that we put a Unna boot back on the right leg. He will also contact the wound care center in [facility] to see if they can change his dressing in the mid week. 11/3; patient returned from his vacation to [facility]. He was seen on 1 occasion at their wound care center. They did a 2 layer compression system as they did not have our 4-layer wrap. I am not completely  certain what they put on the wounds. They did not change the Unna boot on the right. The patient is also seeing a lymphedema specialist physical therapist in [place]. It appears that he has some compression sleeve for his thighs which indeed look quite a bit better than I am used to seeing. He pumps over these with his external compression pumps. 11/10; the patient has a new wound on the right medial thigh otherwise there is no open areas on the right. He has an area on the left leg posteriorly anteriorly and medially and an area over the left second toe. We have been using silver alginate. He thinks the injury on his thigh is secondary to friction from the compression sleeve he has. 11/17; the patient has a new wound on the right medial thigh last week. He thinks this is because he did not have a underlying stocking for his thigh juxta lite apparatus. He now has this. The area is fairly large and somewhat  angry but I do not think he has underlying cellulitis. ooHe has a intact blister on the right anterior tibial area. ooSmall wound on the right great toe dorsally ooSmall area on the medial left calf. 11/30; the patient does not have any open areas on his right leg and we did not take his juxta lite stocking off. However he states that on Friday his compression wrap fell down lodging around his upper mid calf area. As usual this creates a lot of problems for him. He called urgently today to be seen for a nurse visit however the nurse visit turned into a provider visit because of extreme erythema and pain in the left anterior tibia extending laterally and posteriorly. The area that is problematic is extensive 10/06/2019 upon evaluation today patient actually appears to be doing poorly in regard to his left lower extremity. He Dr. Leanord Hawking did place him on doxycycline this past Monday apparently due to the fact that he was doing much worse in regard to this left leg. Fortunately the doxycycline does seem to be helping. Unfortunately we are still having a very difficult time getting his edema under any type of control in order to anticipate discharge at some point. The only way were really able to control his lymphedema really is with compression wraps and that has only even seemingly temporary. He has been seeing a lymphedema clinic they are trying to help in this regard but still this has been somewhat frustrating in general for the patient. 10/13/19 on evaluation today patient appears to be doing excellent with regard to his right lower extremity as far as the wounds are concerned. His swelling is still quite extensive unfortunately. He is still having a lot of drainage from the thigh areas bilaterally which is unfortunate. He's been going to lymphedema clinic but again he still really does not have this edema under control as far as his lower extremities are concern. With regard to his left lower  extremity this seems to be improving and I do believe the doxycycline has been of benefit for him. He is about to complete the doxycycline. 10/20/2019 on evaluation today patient appears to be doing poorly in regard to his bilateral lower extremities. More in the right thigh he has a lot of irritation at this site unfortunately. In regard to the left lower extremity the wrap was not quite as high it appears and does seem to have caused him some trouble as well. Fortunately there is no evidence of systemic infection though  he does have some blue-green drainage which has me concerned for the possibility of Pseudomonas. He tells me he is previously taking Cipro without complications and he really does not care for Levaquin however due to some of the side effects he has. He is not allergic to any medications specifically antibiotics that were aware of. 10/27/2019 on evaluation today patient actually does appear to be for the most part doing better when compared to last week's evaluation. With that being said he still has multiple open wounds over the bilateral lower extremities. He actually forgot to start taking the Cipro and states that he still has the whole bottle. He does have several new blisters on left lower extremity today I think I would recommend he go ahead and take the Cipro based on what I am seeing at this point. 12/30-Patient comes at 1 week visit, 4 layer compression wraps on the left and Unna boot on the right, primary dressing Xtrasorb and silver alginate. Patient is taking his Cipro and has a few more days left probably 5-6, and the legs are doing better. He states he is using his compressions devices which I believe he has 11/10/2019 on evaluation today patient actually appears to be much better than last time I saw him 2 weeks ago. His wounds are significantly improved and overall I am very pleased in this regard. Fortunately there is no signs of active infection at this time. He is  just a couple of days away from completing Cipro. Overall his edema is much better he has been using his lymphedema pumps which I think is also helping at this point. 11/17/2019 on evaluation today patient appears to be doing excellent in regard to his wounds in general. His legs are swollen but not nearly as much as they have been in the past. Fortunately he is tolerating the compression wraps without complication. No fevers, chills, nausea, vomiting, or diarrhea. He does have some erythema however in the distal portion of his right lower extremity specifically around the forefoot and toes there is a little bit of warmth here as well. 11/24/2019 on evaluation today patient appears to be doing well with regard to his right lower extremity I really do not see any open wounds at this point. His left lower extremity does have several open areas and his right medial thigh also is open. Other than this however overall the patient seems to be making good progress and I am very pleased at this point. 12/01/2019 on evaluation today patient appears to be doing poorly at this point in regard to his left lower extremity has several new blisters despite the fact that we have him in compression wraps. In fact he had a 4-layer compression wrap, his upper thigh wrapped from lymphedema clinic, and a juxta light over top of the 4 layer compression wrap the lymphedema clinic applied and despite all this he still develop blisters underneath. Obviously this does have me concerned about the fact that unfortunately despite what we are doing to try to get wounds healed he continues to have new areas arise I do not think he is ever good to be at the point where he can realistically just use wraps at home to keep things under control. Typically when we heal him it takes about 1-2 days before he is back in the clinic with severe breakdown and blistering of his lower extremities bilaterally. This is happened numerous times in the  past. Unfortunately I think that we may need some help  as far as overall fluid overload to kind of limit what we are seeing and get things under better control. 12/08/2019 on evaluation today patient presents for follow-up concerning his ongoing bilateral lower extremity edema. Unfortunately he is still having quite a bit of swelling the compression wraps are controlling this to some degree but he did see Dr. [name] his cardiologist I do have that available for review today as far as the appointment was concerned that was on 12/06/2019. Obviously that she has been 2 days ago. The patient states that he is only been taking the Lasix 80 mg 1 time a day he had told me previously he was taking this twice a day. Nonetheless Dr. [name] recommended this be up to 80 mg 2 times a day for the patient as he did appear to be fluid overloaded. With that being said the patient states he did this yesterday and he was unable to go anywhere or do anything due to the fact that he was constantly having to urinate. Nonetheless I think that this is still good to be something that is important for him as far as trying to get his edema under control at all things that he is going to be able to just expect his wounds to get under control and things to be better without going through at least a period of time where he is trying to stabilize his fluid management in general and I think increasing the Lasix is likely the first step here. It was also mentioned the possibility that the patient may require metolazone. With that being said he wanted to have the patient take Lasix twice a day first and then reevaluating 2 months to see where things stand. 12/15/2019 upon evaluation today patient appears to be doing regard to his legs although his toes are showing some signs of weeping especially on the left at this point to some degree on the right. There does not appear to be any signs of active infection and overall I do feel like the  compression wraps are doing well for him but he has not been able to take the Lasix at home and the increased dose that Dr. [name] recommended. He tells me that just not go to be feasible for him. Nonetheless I think in this case he should probably send a message to Dr. [name] in order to discuss options from the standpoint of possible admission to get the fluid off or otherwise going forward. 12/22/2019 upon evaluation today patient appears to be doing fairly well with regard to his lower extremities at this point. In fact he would be doing excellent if it was not for the fact that his right anterior thigh apparently had an allergic reaction to adhesive tape that he used. The wound itself that we have been monitoring actually appears to be healed. There is a lot of irritation at this point. 12/29/2019 upon evaluation today patient appears to be doing well in regard to his lower extremities. His left medial thigh is open and somewhat draining today but this is the only region that is open the right has done much better with the treatment utilizing the steroid cream that I prescribed for him last week. Overall I am pleased in that regard. Fortunately there is no signs of active infection at this time. No fevers, chills, nausea, vomiting, or diarrhea. 01/05/2020 upon evaluation today patient appears to be doing more poorly in regard to his right lower extremity at this point upon evaluation today. Unfortunately  he continues to have issues in this regard and I think the biggest issue is controlling his edema. This obviously is not very well controlled at this point is been recommended that he use the Lasix twice a day but he has not been able to do that. Unfortunately I think this is leading to an issue where honestly he is not really able to effectively control his edema and therefore the wounds really are not doing significantly better. I do not think that he is going to be able to keep things under good  control unless he is able to control his edema much better. I discussed this again in great detail with him today. 01/12/2020 good news is patient actually appears to be doing quite well today at this point. He does have an appointment with lymphedema clinic tomorrow. His legs appear healed and the toe on the left is almost completely healed. In general I am very pleased with how things stand at this point. 01/19/2020 upon evaluation today patient appears to actually be doing well in regard to his lower extremities there is nothing open at this point. Fortunately he has done extremely well more recently. Has been seeing lymphedema clinic as well. With that being said he has Velcro wraps for his lower legs as well as his upper legs. The only wound really is on his toe which is the right great toe and this is barely anything even there. With all that being said I think it is good to be appropriate today to go ahead and switch him over to the Velcro compression wraps. 01/26/2020 upon evaluation today patient appears to be doing worse with regard to his lower extremities after last week switch him to Velcro compression wraps. Unfortunately he lasted less than 24 hours he did not have the sock portion of his Velcro wrap on the left leg and subsequently developed a blister underneath the Velcro portion. Obviously this is not good and not what we were looking for at this point. He states the lymphedema clinic did tell him to wear the wrap for 23 hours and take him off for 1 I am okay with that plan but again right now we got a get things back under control again he may have some cellulitis noted as well. 02/02/2020 upon evaluation today patient unfortunately appears to have several areas of blistering on his bilateral lower extremities today mainly on the feet. His legs do seem to be doing somewhat better which is good news. Fortunately there is no evidence of active infection at this time. No fevers, chills,  nausea, vomiting, or diarrhea. 02/16/2020 upon evaluation today patient appears to be doing well at this time with regard to his legs. He has a couple weeping areas on his toes but for the most part everything is doing better and does appear to be sealed up on his legs which is excellent news. We can continue with wrapping him at this point as he had every time we discontinue the wraps he just breaks out with new wounds. There is really no point in is going forward with this at this point. 03/08/2020 upon evaluation today patient actually appears to be doing quite well with regard to his lower extremity ulcers. He has just a very superficial and really almost nonexistent blister on the left lower extremity he has in general done very well with the compression wraps. With that being said I do not see any signs of infection at this time which is good  news. 03/29/2020 upon evaluation today patient appears to be doing well with regard to his wounds currently except for where he had several new areas that opened up due to some of the wrap slipping and causing him trouble. He states he did not realize they had slipped. Nonetheless he has a 1 area on the right and 3 new areas on the left. Fortunately there is no signs of active infection at this time which is great news. 04/05/2020 upon evaluation today patient actually appears to be doing quite well in general in regard to his legs currently. Fortunately there is no signs of active infection at this time. No fevers, chills, nausea, vomiting, or diarrhea. He tells me next week that he will actually be seen in the lymphedema clinic on Thursday at 10 AM I see him on Wednesday next week. 04/12/2020 upon evaluation today patient appears to be doing very well with regard to his lower extremities bilaterally. In fact he does not appear to have any open wounds at this point which is good news. Fortunately there is no signs of active infection at this time. No fevers,  chills, nausea, vomiting, or diarrhea. 04/19/2020 upon evaluation today patient appears to be doing well with regard to his wounds currently on the bilateral lower extremities. There does not appear to be any signs of active infection at this time. Fortunately there is no evidence of systemic infection and overall very pleased at this point. Nonetheless after I held him out last week he literally had blisters the next morning already which swelled up with him being right back here in the clinic. Overall I think that he is just not can be able to be discharged with his legs the way they are he is much to volume overloaded as far as fluid is concerned and that was discussed with him today of also discussed this but should try the clinic nurse manager as well as Dr. Leanord Hawking. 04/26/2020 upon evaluation today patient appears to be doing better with regard to his wounds currently. He is making some progress and overall swelling is under good control with the compression wraps. Fortunately there is no evidence of active infection at this time. 05/10/2020 on evaluation today patient appears to be doing overall well in regard to his lower extremities bilaterally. He is Tolerating the compression wraps without complication and with what we are seeing currently I feel like that he is making excellent progress. There is no signs of active infection at this time. 05/24/2020 upon evaluation today patient appears to be doing well in regard to his legs. The swelling is actually quite a bit down compared to where it has been in the past. Fortunately there is no sign of active infection at this time which is also good news. With that being said he does have several wounds on his toes that have opened up at this point. 05/31/2020 upon evaluation today patient appears to be doing well with regard to his legs bilaterally where he really has no significant fluid buildup at this point overall he seems to be doing quite well. Very  pleased in this regard. With regard to his toes these also seem to be drying up which is excellent. We have continue to wrap him as every time we tried as a transition to the juxta light wraps things just do not seem to get any better. 06/07/2020 upon evaluation today patient appears to be doing well with regard to his right leg at this point. Unfortunately left  leg has a lot of blistering he tells me the wrap started to slide down on him when he tried to put his other Velcro wrap over top of it to help keep things in order but nonetheless still had some issues. 06/14/2020 on evaluation today patient appears to be doing well with regard to his lower extremity ulcers and foot ulcers at this point. I feel like everything is actually showing signs of improvement which is great news overall there is no signs of active infection at this time. No fevers, chills, nausea, vomiting, or diarrhea. 06/21/2020 on evaluation today patient actually appears to be doing okay in regard to his wounds in general. With that being said the biggest issue I see is on his right foot in particular the first and second toe seem to be doing a little worse due to the fact this is staying very wet. I think he is probably getting need to change out his dressings a couple times in between each week when we see him in regard to his toes in order to keep this drier based on the location and how this is proceeding. 06/28/2020 on evaluation today patient appears to be doing a little bit more poorly overall in regard to the appearance of the skin I am actually somewhat concerned about the possibility of him having a little bit of an infection here. We discussed the course of potentially giving him a doxycycline prescription which he is taken previously with good result. With that being said I do believe that this is potentially mild and at this point easily fixed. I just do not want anything to get any worse. 07/12/2020 upon evaluation today  patient actually appears to be making some progress with regard to his legs which is great news there does not appear to be any evidence of active infection. Overall very pleased with where things stand. 07/26/2020 upon evaluation today patient appears to be doing well with regard to his leg ulcers and toe ulcers at this point. He has been tolerating the compression wraps without complication overall very pleased in this regard. 08/09/2020 upon evaluation today patient appears to be doing well with regard to his lower extremities bilaterally. Fortunately there is no signs of active infection overall I am pleased with where things stand. 08/23/2020 on evaluation today patient appears to be doing well with regard to his wound. He has been tolerating the dressing changes without complication. Fortunately there is no signs of active infection at this time. Overall his legs seem to be doing quite well which is great news and I am very pleased in that regard. No fevers, chills, nausea, vomiting, or diarrhea. 09/13/2020 upon evaluation today patient appears to be doing okay in regard to his lower extremities. He does have a fairly large blister on the right leg which I did remove the blister tissue from today so we can get this to dry out other than that however he seems to be doing quite well. There is no signs of active infection at this time. 09/27/2020 upon evaluation today patient appears to actually be doing some better in regard to his right leg. Fortunately signs of active infection at this time which is great news. No fevers, chills, nausea, vomiting, or diarrhea. 10/04/2020 upon evaluation today patient actually appears to be showing signs of improvement which is great news with regard to his leg ulcers. Fortunately there is no signs of active infection which is great news he is still taking the antibiotics currently.  No fevers, chills, nausea, vomiting, or diarrhea. 10/18/2020 on evaluation today  patient appears to be doing well with regard to his legs currently. He has been tolerating the dressing changes including the wraps without complication. Fortunately there is no signs of active infection at this time. No fevers, chills, nausea, vomiting, or diarrhea. 10/25/2020 upon evaluation today patient appears to be doing decently well in regard to his wounds currently. He has been tolerating the dressing changes without complication. Overall I feel like he is making good progress albeit slow. Again this is something we can have to continue to wrap for some time to come most likely. 11/08/2020 upon evaluation today patient appears to be doing well with regard to his wounds currently. He has been tolerating the dressing changes without complication is not currently on any antibiotics and he does not appear to show any signs of infection. He does continue to have a lot of drainage on the right leg not too severe but nonetheless this is very scattered. On the left leg this is looking to be much improved overall. 11/15/2020 upon evaluation today patient appears to be doing better with regard to his legs bilaterally. Especially the right leg which was much more significant last week. There does not appear to be any signs of active infection which is great news. No fevers, chills, nausea, vomiting, or diarrhea. 11/23/2019 upon evaluation today patient appears to be doing poorly still in regard to his lower extremities bilaterally. Unfortunately his right leg in particular appears to be doing much more poorly there is no signs really of infection this is not warm to touch but he does have a lot of drainage and weeping unfortunately. With that reason I do believe that we may need to initiate some treatment here to try to help calm down some of the swelling of the right leg. I think switching to a 4-layer compression wrap would be beneficial here. The patient is in agreement with giving this a try. 11/29/2020  upon evaluation today patient appears to be doing well currently in regard to his leg ulcers. I feel like the right leg is doing better he still has a lot of drainage but we do see some improvement here. The 4-layer compression wrap I think was helpful. 12/06/2020 upon evaluation today patient appears to be doing well with regard to his legs. In fact they seem to be doing about the best I have seen up to this point. Fortunately there is no signs of active infection at this time. No fevers, chills, nausea, vomiting, or diarrhea. 12/20/2020 upon evaluation today patient appears to be doing well at this time in regard to his legs. He is not having any significant draining which is great news. Fortunately there is no signs of active infection at this time. No fevers, chills, nausea, vomiting, or diarrhea. 01/17/2021 upon evaluation today [place] actually appears to be doing excellent in regard to his legs. He has a few areas again that come and go as far as his toes are concerned but overall this is doing quite well. 01/31/2021 upon evaluation today patient appears to be doing well with regard to his legs. Fortunately there does not appear to be any signs of active infection which is great news. Overall he is still having significant edema despite the compression wraps basically the 4-layer compression wrap to just keep things under control there is really not much room for play. 4/13: Mr. Kirtz is a longstanding patient in our clinic and benefits greatly  from weekly compression wraps. Today he has no complaints. He has been tolerating the wraps well. He states he is using the lymphedema pumps at home. 5/4; patient presents for follow-up of his chronic lymphedema/venous insufficiency ulcers. He comes weekly for compression wraps. He has no complaints today. He was unable to tolerate the Coflex 2 layer Last week so we will do the four press 4-layer compression. He has been using his lymphedema  pumps daily. 5/18; patient presents for 2-week follow-up. He has no complaints or issues today. He has developed a new wound to the right foot on his fourth toe. He overall feels well and denies signs of infection. 6/1; patient presents for 2-week follow-up. He has no complaints or issues today. He denies signs of infection. 04/18/2021 upon evaluation today patient appears to be doing well with regard to his legs bilaterally. Family open wound is actually on the toe of his left foot everything else is completely closed which is great news. In general I am extremely pleased with where things stand at this point. The patient is also happy that things are doing so well. 05/02/2021 upon evaluation today patient's legs actually appear to be doing quite well today. Fortunately there does not appear to be any signs of active infection which is great and overall I am extremely pleased with where he stands today. The patient does not appear to have any evidence of active infection at this time which is also great news. 05/09/2021 upon evaluation today patient appears to be doing a little bit more poorly in regard to his legs. Unfortunately he is having issues with some breakdown and a blood blister on the left leg this is due to I believe honestly to how it was wrapped last week. Fortunately there does not appear to be any signs of infection but nonetheless this is still a concern to be honest. No fevers, chills, nausea, vomiting, or diarrhea. 05/16/2021 upon evaluation today patient appears to be doing significantly better as compared to last week. I am very pleased with where things stand today. There does not appear to be any signs of infection which is great news and overall very pleased with where we stand. No fevers, chills, nausea, vomiting, or diarrhea. 05/30/2021 upon evaluation today patient appears to be doing well with regard to his legs. He has been tolerating the dressing changes without  complication. Fortunately there does not appear to be any signs of active infection which is great news and overall I am extremely pleased with where things stand today. No fevers, chills, nausea, vomiting, or diarrhea. 06/20/2021 upon evaluation today patient actually appears to be making good progress today and very pleased with what we are seeing. I think his legs are really maintaining. As long as we continue wrapping he seems to be doing excellent in my opinion. Fortunately there is no signs of active infection at this time. No fevers, chills, nausea, vomiting, or diarrhea. 07/11/2021 upon evaluation today patient actually appears to be making excellent progress at this time. Fortunately there does not appear to be any evidence of active infection which is great news and overall I am extremely pleased with where things stand today. No fevers, chills, nausea, vomiting, or diarrhea. 07/25/2021 upon evaluation today patient appears to be doing well currently in regard to his lower extremities. He has been making good progress here and I do not see anything that is actually open significantly today this is great news. No fevers, chills, nausea, vomiting, or diarrhea. 08/08/2021  upon evaluation today patient appears to be doing well with regard to his wound. He has been tolerating the dressing changes without complication. With that being said unfortunately has a new area that opened up as far as his right posterior leg is concerned this was a blister he also has an area on the third toe right foot which also reopen. Fortunately there is no signs of active infection at this time which is great news. No fevers, chills, nausea, vomiting, or diarrhea. 10/17; patient came in today at his request initially for a nurse visit because it but out of concern for deterioration in both his lower legs and cellulitis I was asked to look at him. He comes in with increased swelling which he says started over the weekend  he started to notice pain as well in his left medial ankle, right knee, left knee left dorsal foot. His wraps fell down contributing to some of this but he has not been using his compression pumps over the weekend for reasons that are not really clear. He comes in with multiple new wounds including the right posterior leg, right third toe, right fourth toe, left second toe left medial ankle left dorsal ankle and right anterior lower leg 09/19/2021 upon evaluation today patient does seem to be making improvements in general which is great news. I do not see any evidence of infection currently he does have some hypergranulation of the anterior portion of his ankle on the left side this is going require some debridement to pare this down and then subsequently silver nitrate probably due to the amount of bleeding that he is probably going to experience. He is in agreement with this plan however. 09/26/2021 upon evaluation today although the patient's legs appear to be doing okay today unfortunately he did have maggots noted during the evaluation as well. This is again quite unfortunate with the respect to the fact that he feels like that he got the wrap wet which was noted today he is not sure when and I think this is what led to the issue. Nonetheless we can try to see what we can do about silly things off so that this does not happen again in the future although he is can have to be diligent about taking care of his wraps as well. 10/03/2021 upon evaluation today patient appears to be doing okay in regard to his legs currently. He unfortunately has maggots again noted on the left foot. This obviously is becoming quite an issue to be perfectly honest. I do think that he needs to see what can be done at home to try to limit this exposure. Last week we had cleaned everything away so this is a new infestation as far as that is concerned. There is really not much that I can do to combat that I am trying to  do what we can here in the clinic to wrap his foot and try to prevent any access of that again with knots there is a small this becomes very difficult. 10/10/2021 upon evaluation today patient appears to be doing poorly in regard to his legs he is very swollen and unfortunately I think a big part of the issue here is simply that he is not taking his fluid pills appropriately stressed probably is not helping much at all either. He still having a lot of issues as far as getting moved and having to be out of his current living condition. With that being said he does have of  note maggots noted on the right foot this time completely separate from what we have been seeing he tells me that he got his wraps wet again. Its mainly when it rains it sounds like that he goes out to his car in the area where he has to park there is a area that collects a fairly large puddle according to what he tells me today either way I explained to him which we have done before as well that if he gets his wraps wet he needs to let us know so we can get them changed out. He does not need to keep them on wet. 10/17/2021 upon evaluation today patient appears to be doing well in regard to his legs. In fact things are significantly better compared to where they were previous. Fortunately I see no evidence of infection currently which is great news. No fevers, chills, nausea, vomiting, or diarrhea. 10/24/2021 upon evaluation today patient appears to be doing awesome in regard to his legs in general. I think everything is showing signs of significant improvement which is great news. There is really only 1 opening on the second toe left foot everything else is closed. Objective Constitutional Obese and well-hydrated in no acute distress. Vitals Time Taken: 10:39 AM, Height: 70 in, Weight: 380.2 lbs, BMI: 54.5, Temperature: 98.4 F, Pulse: 84 bpm, Respiratory Rate: 20 breaths/min, Blood Pressure: 115/65 mmHg. Respiratory normal  breathing without difficulty. Psychiatric this patient is able to make decisions and demonstrates good insight into disease process. Alert and Oriented x 3. pleasant and cooperative. General Notes: Upon inspection patient's wounds again are showing signs of improvement in general and overall I think that he has made great progress. I do not see any signs of anything worsening currently. There were no evidences of maggots either at this point all of which I think is awesome. Integumentary (Hair, Skin) Wound #199 status is Healed - Epithelialized. Original cause of wound was Gradually Appeared. The date acquired was: 08/08/2021. The wound has been in treatment 11 weeks. The wound is located on the Right T Second. The wound measures 0cm length x 0cm width x 0cm depth; 0cm^2 area and 0cm^3 volume. oe There is Fat Layer (Subcutaneous Tissue) exposed. There is a medium amount of serous drainage noted. The wound margin is distinct with the outline attached to the wound base. There is no granulation within the wound bed. There is no necrotic tissue within the wound bed. Wound #203 status is Healed - Epithelialized. Original cause of wound was Gradually Appeared. The date acquired was: 08/20/2021. The wound has been in treatment 9 weeks. The wound is located on the Left,Anterior Ankle. The wound measures 0cm length x 0cm width x 0cm depth; 0cm^2 area and 0cm^3 volume. There is Fat Layer (Subcutaneous Tissue) exposed. There is a medium amount of serosanguineous drainage noted. The wound margin is distinct with the outline attached to the wound base. There is no granulation within the wound bed. There is no necrotic tissue within the wound bed. Wound #204 status is Open. Original cause of wound was Gradually Appeared. The date acquired was: 09/26/2021. The wound has been in treatment 4 weeks. The wound is located on the Left T Second. The wound measures 3cm length x 3.5cm width x 0.1cm depth; 8.247cm^2 area and  0.825cm^3 volume. There is oe Fat Layer (Subcutaneous Tissue) exposed. There is no tunneling or undermining noted. There is a medium amount of serosanguineous drainage noted. The wound margin is flat and intact. There is  large (67-100%) red granulation within the wound bed. There is no necrotic tissue within the wound bed. Wound #208 status is Healed - Epithelialized. Original cause of wound was Gradually Appeared. The date acquired was: 10/10/2021. The wound has been in treatment 2 weeks. The wound is located on the Right T Great. The wound measures 0cm length x 0cm width x 0cm depth; 0cm^2 area and 0cm^3 volume. oe There is Fat Layer (Subcutaneous Tissue) exposed. There is a medium amount of serosanguineous drainage noted. The wound margin is distinct with the outline attached to the wound base. There is no granulation within the wound bed. There is no necrotic tissue within the wound bed. 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 Post procedure Diagnosis Wound #: Same as Pre-Procedure There was a Four Layer Compression Therapy Procedure by Shawn Stall, RN. Post procedure Diagnosis Wound #: Same as Pre-Procedure Plan Follow-up Appointments: Return Appointment in 2 weeks. Leonard Schwartz  Wednesday 11/07/2021 Nurse Visit: - Wednesday 10/31/2021 Bathing/ Shower/ Hygiene: May shower with protection but do not get wound dressing(s) wet. Edema Control - Lymphedema / SCD / Other: Lymphedema Pumps. Use Lymphedema pumps on leg(s) 2-3 times a day for 45-60 minutes. If wearing any wraps or hose, do not remove them. Continue exercising as instructed. Elevate legs to the level of the heart or above for 30 minutes daily and/or when sitting, a frequency of: - throughout the day Avoid standing for long periods of time. Exercise regularly Other Edema Control Orders/Instructions: - both legs 4 layer compression with unna boot to secure to upper portion of lower leg. apply lotion to both legs. WOUND #204: - T Second Wound Laterality: Left oe Prim Dressing: KerraCel Ag Gelling Fiber Dressing, 2x2 in (silver alginate) ary Discharge Instructions: Apply silver alginate to wound bed as instructed Secondary Dressing: Woven Gauze Sponges 2x2 in Discharge Instructions: Apply over primary dressing as directed. Secured With: Coban Self-Adherent Wrap 4x5 (in/yd) Discharge Instructions: Secure with Coban as directed. 1. I would recommend that we go ahead and continue with the wound care measures as before and the patient is in agreement with plan we will use silver alginate over the left second toe and otherwise were doing the 4-layer compression wraps bilaterally which has done also for him. 2. I am also can recommend that he continue to elevate his legs he should also continue with his Lasix which I think is doing a good job as well. We will see patient back for reevaluation in 1 week here in the clinic. If anything worsens or changes patient will contact our office for additional recommendations. This will be a nurse visit and I will see him in 2 weeks for provider visit. Electronic Signature(s) Signed: 10/24/2021 1:05:43 PM By: Lenda Kelp PA-C Entered By: Lenda Kelp on 10/24/2021  13:05:43 -------------------------------------------------------------------------------- SuperBill Details Patient Name: Date of Service: [name], Taelon J. 10/24/2021  Medical Record Number: [id number] Patient Account Number: [id number] Date of Birth/Sex: Treating RN: [id number] (70 y.o. [name] Primary Care Provider: Nicoletta Ba Other Clinician: Referring Provider: Treating Provider/Extender: Adele Dan in Treatment: 300 Diagnosis Coding ICD-10 Codes Code Description [phone] Non-pressure chronic ulcer of other 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 of skin L97.829 Non-pressure chronic ulcer of other part of left lower leg with unspecified severity L97.519 Non-pressure chronic ulcer of other part of right foot with unspecified severity L97.812 Non-pressure chronic ulcer of other part of right lower leg with fat layer exposed I87.333 Chronic venous hypertension (idiopathic) with ulcer and inflammation of bilateral lower extremity L03.116 Cellulitis of left lower limb I89.0 Lymphedema, not elsewhere classified E11.40 Type 2 diabetes mellitus with diabetic neuropathy, unspecified E11.622 Type 2 diabetes mellitus with other skin ulcer L03.115 Cellulitis of right lower limb Facility Procedures CPT4: Code 24401027 [id number] foot Description: 1 BILATERAL: Application of multi-layer venous compression system; leg (below knee), including ankle and . 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

## 2021-10-24 NOTE — Progress Notes (Signed)
Name: [name]  DOB: 08-09-1951  MRN: [id number]  PCP: [name], MD  Referring Provider: Dr. Leanord Hawking   Virtual Visit via Mychart Video  I connected with [name] on 10/24/21 at  1:45 PM EST by TELEPHONE Portal and verified that I am speaking with the correct person using two identifiers.   I discussed the limitations, risks, security and privacy concerns of performing an evaluation and management via virtual service and the availability of in person appointments. I also discussed with the patient that there may be a patient responsible charge related to this service. The patient expressed understanding and agreed to proceed.  Patient Location: [place], [place]  Provider Location: [facility]    Subjective:   CC:  Follow up on recurrent lower extremity cellulitis infections previously on prophylactic bicillin   HPI: We had trouble with video connection and transitioned to telephone visit.  Had one episode where he was wrapped too tight and he started with a blister that evolved that required treatment for cellulitis.   He had a hard time with injections since new office personnel administering (pain, bloody drainage on his clothes frequently). He would like to continue to observe off antibiotics.   He has had trouble with his apartment and spent some time discussing. He is currently in a hotel awaiting his new apartment.    ROS   Past Medical History:  Diagnosis Date   ALLERGIC RHINITIS 08/11/2006   ASTHMA 08/11/2006   Bradycardia    Beta blocker d/c'd 2022   Chronic atrial fibrillation (HCC) 08/2014   Chronic combined systolic and diastolic CHF (congestive heart failure) [facility])    Cardiology f/u 12/2017: pt volume overloaded (R heart dysf suspected), BNP very high, lasix increased.  Repeat echo 12/2017: normal LV EF, mild DD, +RV syst dysfxn, mod pulm HTN, biatrial enlgmt.   Chronic constipation    Chronic renal insufficiency, stage 3 (moderate)  (HCC)    Colon cancer screening 02/2021   02/2021 Cologuard POS->GI ref   DIABETES MELLITUS, TYPE II 08/11/2006   HYPERTENSION 08/11/2006   Impaired mobility and endurance    MRSA infection 06/2018   LL venous stasis ulcer infected   Normocytic anemia 2016-2019   03/2018 B12 normal, iron ok (ferritin borderline low).   OBESITY, MORBID 12/14/2007   saxenda started 05/2020 by [facility] wt mgmt center   OSA (obstructive sleep apnea)    to get sleep study with [facility]) as of 12/03/2018 consult.   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  92018 ID started him on amoxil prophylaxis---ineffective.  End 2018/Jan 2019 penicillin G injections prophyl helpful but pt declined to continue this as of 01/2018 ID f/u.  ID talked him into resuming monthly penicillin G as of 02/2018 f/u.     Restless leg syndrome    Rx'd clonazepam 09/2017 and pt refused to take it after reading the medication's potential side effects.   Venous stasis ulcers of both lower extremities (HCC)    Severe lymphedema.  wound clinic care ongoing as of 01/2018   Outpatient Medications Prior to Visit  Medication Sig Dispense Refill   amLODipine (NORVASC) 10 MG tablet Take 1 tablet (10 mg total) by mouth daily. 90 tablet 3   arginine 500 MG tablet Take by mouth.     b complex vitamins capsule Take 1 capsule by mouth every morning.     butalbital-acetaminophen-caffeine (FIORICET WITH CODEINE) 50-325-40-30 MG  capsule TAKE 1 TO 2 CAPSULES BY MOUTH EVERY 6 HOURS AS NEEDED FOR HEADACHES. NOT TO EXCEED 6 CAPSULES PER DAY 60 capsule 5   cetirizine (ZYRTEC) 10 MG tablet Take 10 mg by mouth daily.     CHROMIUM GTF PO Take by mouth daily.     cloNIDine (CATAPRES) 0.3 MG tablet Take 1 tablet (0.3 mg total) by mouth 3 (three) times daily. 90 tablet 3   Coenzyme Q10 (CO Q 10) 10 MG CAPS Take by mouth. Reported on 02/19/2016     diltiazem (TIAZAC) 120 MG 24 hr capsule  Take 1 capsule (120 mg total) by mouth daily. 90 capsule 3   furosemide (LASIX) 40 MG tablet TAKE 2 TABLETS BY MOUTH  TWICE DAILY 360 tablet 1   Glutamine 500 MG CAPS Take by mouth.     HYDROcodone-acetaminophen (NORCO/VICODIN) 5-325 MG tablet 1-2 tabs po bid prn pain 60 tablet 0   Insulin Lispro Prot & Lispro (HUMALOG MIX 75/25 KWIKPEN) (75-25) 100 UNIT/ML Kwikpen INJECT SUBCUTANEOUSLY 25  UNITS EVERY MORNING AND 20  UNITS EVERY EVENING AT  SUPPER 45 mL 3   Insulin Pen Needle (B-D ULTRAFINE III SHORT PEN) 31G X 8 MM MISC USE 1 PENNEEDLE TWICE DAILY 200 each 1   isosorbide mononitrate (IMDUR) 30 MG 24 hr tablet Take 1 tablet (30 mg total) by mouth daily. 90 tablet 3   metFORMIN (GLUCOPHAGE) 1000 MG tablet TAKE 1 TABLET BY MOUTH  TWICE DAILY WITH MEALS 180 tablet 1   Multiple Vitamin (MULTIVITAMIN) tablet Take 1 tablet by mouth daily.     ONETOUCH ULTRA test strip CHECK BLOOD SUGAR TWICE  DAILY 200 strip 3   rivaroxaban (XARELTO) 20 MG TABS tablet TAKE 1 TABLET BY MOUTH ONCE DAILY WITH SUPPER 90 tablet 3   terazosin (HYTRIN) 10 MG capsule TAKE 1 CAPSULE BY MOUTH  ONCE DAILY AT BEDTIME 90 capsule 3   valsartan (DIOVAN) 320 MG tablet Take 1 tablet (320 mg total) by mouth daily. 90 tablet 3   vitamin E 400 UNIT capsule Take 400 Units by mouth every morning. Selenium 50mg      No facility-administered medications prior to visit.   Allergies  Allergen Reactions   Other Other (See Comments)    Sneezing, coughing   Hydralazine Other (See Comments)    Drowsiness/sedation/mental fog    Physical Exam and Objective Findings: There were no vitals filed for this visit. There is no height or weight on file to calculate BMI.  Physical Exam Pulmonary:     Effort: Pulmonary effort is normal.     Comments: No shortness of breath detected in conversation.  Neurological:     Mental Status: He is oriented to person, place, and time.  Psychiatric:        Mood and Affect: Mood normal.        Behavior:  Behavior normal.        Thought Content: Thought content normal.        Judgment: Judgment normal.      ASSESSMENT & PLAN:  Problem List Items Addressed This Visit       Unprioritized   Recurrent cellulitis of lower extremity    Last injection 08/03/2021. Since then over the last 3 months he reports 1 episode of cellulitis from a friction blister that required treatment. He would like to continue to observe off prophylactic antibiotics.  We discussed that if he has > 3 flares in 70m period would be a good idea to touch base  again with a plan.  May be more helpful to do 1g BID amoxil for suppression vs smaller dose to account for BSA if he wanted to avoid monthly injections.  Continue wound care strategies to reduce lymphedema.        Follow Up Instructions: Follow up PRN    I discussed the assessment and treatment plan with the patient. The patient was provided an opportunity to ask questions and all were answered. The patient agreed with the plan and demonstrated an understanding of the instructions.   The patient was advised to call back or seek an in-person evaluation if the symptoms worsen or if the condition fails to improve as anticipated.  I provided 9 minutes of non-face-to-face time during this encounter.   Rexene Alberts, MSN, NP-C [facility]  [email or web address] Pager: [phone] Office: [phone] [facility] Main Line: [phone]

## 2021-10-31 ENCOUNTER — Encounter (HOSPITAL_BASED_OUTPATIENT_CLINIC_OR_DEPARTMENT_OTHER): Payer: Medicare Other | Admitting: Internal Medicine

## 2021-10-31 ENCOUNTER — Other Ambulatory Visit: Payer: Self-pay

## 2021-10-31 DIAGNOSIS — L97812 Non-pressure chronic ulcer of other part of right lower leg with fat layer exposed: Secondary | ICD-10-CM | POA: Diagnosis not present

## 2021-10-31 DIAGNOSIS — I87333 Chronic venous hypertension (idiopathic) with ulcer and inflammation of bilateral lower extremity: Secondary | ICD-10-CM | POA: Diagnosis not present

## 2021-10-31 DIAGNOSIS — L03116 Cellulitis of left lower limb: Secondary | ICD-10-CM | POA: Diagnosis not present

## 2021-10-31 DIAGNOSIS — L03115 Cellulitis of right lower limb: Secondary | ICD-10-CM | POA: Diagnosis not present

## 2021-10-31 DIAGNOSIS — L97519 Non-pressure chronic ulcer of other part of right foot with unspecified severity: Secondary | ICD-10-CM | POA: Diagnosis not present

## 2021-10-31 DIAGNOSIS — E11622 Type 2 diabetes mellitus with other skin ulcer: Secondary | ICD-10-CM | POA: Diagnosis not present

## 2021-10-31 DIAGNOSIS — L97829 Non-pressure chronic ulcer of other part of left lower leg with unspecified severity: Secondary | ICD-10-CM | POA: Diagnosis not present

## 2021-10-31 DIAGNOSIS — L97521 Non-pressure chronic ulcer of other 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 of skin: Secondary | ICD-10-CM | POA: Diagnosis not present

## 2021-10-31 DIAGNOSIS — I890 Lymphedema, not elsewhere classified: Secondary | ICD-10-CM | POA: Diagnosis not present

## 2021-10-31 DIAGNOSIS — E1140 Type 2 diabetes mellitus with diabetic neuropathy, unspecified: Secondary | ICD-10-CM | POA: Diagnosis not present

## 2021-10-31 NOTE — Progress Notes (Signed)
CHIJIOKE, LASSER ([id number]) Visit Report for 10/31/2021 SuperBill Details Patient Name: Date of Service: CO VICTORINO, FATZINGER 10/31/2021 Medical Record Number: [id number] Patient Account Number: [id number] Date of Birth/Sex: Treating RN: 18-Mar-1951 (70 y.o. [name], [name] Primary Care Provider: Nicoletta Ba Other Clinician: Referring Provider: Treating Provider/Extender: Suzzette Righter in Treatment: 301 Diagnosis Coding ICD-10 Codes Code Description [id number] Non-pressure chronic ulcer of other 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 of skin L97.829 Non-pressure chronic ulcer of other part of left lower leg with unspecified severity L97.519 Non-pressure chronic ulcer of other part of right foot with unspecified severity L97.812 Non-pressure chronic ulcer of other part of right lower leg with fat layer exposed I87.333 Chronic venous hypertension (idiopathic) with ulcer and inflammation of bilateral lower extremity L03.116 Cellulitis of left lower limb I89.0 Lymphedema, not elsewhere classified E11.40 Type 2 diabetes mellitus with diabetic neuropathy, unspecified E11.622 Type 2 diabetes mellitus with other skin ulcer L03.115 Cellulitis of right lower limb Facility Procedures CPT4 Description Modifier Quantity Code [id number] BILATERAL: Application of multi-layer venous compression system; leg (below knee), including ankle and 1 foot. Electronic Signature(s) Signed: 10/31/2021 3:01:11 PM By: Shawn Stall RN, BSN Signed: 10/31/2021 3:46:55 PM By: Baltazar Najjar MD Entered By: Shawn Stall on 10/31/2021 11:45:50

## 2021-10-31 NOTE — Progress Notes (Signed)
[name], CHLUDZINSKI ([id number]) Visit Report for 10/31/2021 Arrival Information Details Patient Name: Date of Service: [name], OVERGAARD 10/31/2021 10:30 A M Medical Record Number: [id number] Patient Account Number: [id number] Date of Birth/Sex: Treating RN: 07/15/1951 (70 y.o. Collene Gobble Primary Care Janiel Crisostomo: [name] Other Clinician: Referring Taijah Macrae: Treating Oaklyn Jakubek/Extender: Ruffin Pyo in Treatment: 66 Visit Information History Since Last Visit Added or deleted any medications: No Patient Arrived: [name] new allergies or adverse reactions: No Arrival Time: 10:58 Had a fall or experienced change in No Accompanied By: self activities of daily living that may affect Transfer Assistance: None risk of falls: Patient Identification Verified: Yes Signs or symptoms of abuse/neglect since last visito No Patient Requires Transmission-Based Precautions: No Hospitalized since last visit: No Patient Has Alerts: Yes Implantable device outside of the clinic excluding No cellular tissue based products placed in the center since last visit: Has Dressing in Place as Prescribed: Yes Has Compression in Place as Prescribed: Yes Pain Present Now: No Electronic Signature(s) Signed: 10/31/2021 5:15:38 PM By: Dellie Catholic RN Entered By: Dellie Catholic on 10/31/2021 11:02:55 -------------------------------------------------------------------------------- Compression Therapy Details Patient Name: Date of Service: [name], Zayvian J. 10/31/2021 10:30 A M Medical Record Number: [id number] Patient Account Number: [id number] Date of Birth/Sex: Treating RN: [birth year] (70 y.o. [name] Primary Care Jacquelene Kopecky: [name] Other Clinician: Referring Ambreen Tufte: Treating Sabriel Borromeo/Extender: Ruffin Pyo in Treatment: 301 Compression Therapy Performed for Wound Assessment: NonWound Condition Lymphedema - Right  Leg Performed By: Clinician Deon Pilling, RN Compression Type: Four Layer Electronic Signature(s) Signed: 10/31/2021 3:01:11 PM By: Deon Pilling RN, BSN Entered By: Deon Pilling on 10/31/2021 11:42:41 -------------------------------------------------------------------------------- Compression Therapy Details Patient Name: Date of Service: [name], Damarie J. 10/31/2021 10:30 A M Medical Record Number: [id number] Patient Account Number: [id number] Date of Birth/Sex: Treating RN: 11-Mar-1951 (70 y.o. [name] Primary Care Rolfe Hartsell: [name] Other Clinician: Referring Antoinetta Berrones: Treating Angelik Walls/Extender: Ruffin Pyo in Treatment: 301 Compression Therapy Performed for Wound Assessment: NonWound Condition Lymphedema - Left Leg Performed By: Clinician Deon Pilling, RN Compression Type: Four Layer Electronic Signature(s) Signed: 10/31/2021 3:01:11 PM By: Deon Pilling RN, BSN Entered By: Deon Pilling on 10/31/2021 11:43:35 -------------------------------------------------------------------------------- Encounter Discharge Information Details Patient Name: Date of Service: [facility], Taiga J. 10/31/2021 10:30 A M Medical Record Number: [id number] Patient Account Number: [id number] Date of Birth/Sex: Treating RN: Jul 12, 1951 (70 y.o. [name] Primary Care Jibran Crookshanks: [name] Other Clinician: Referring Riddick Nuon: Treating Lexie Koehl/Extender: Ruffin Pyo in Treatment: 301 Encounter Discharge Information Items Discharge Condition: Stable Ambulatory Status: Walker Discharge Destination: Home Transportation: Private Auto Accompanied By: self Schedule Follow-up Appointment: Yes Clinical Summary of Care: Electronic Signature(s) Signed: 10/31/2021 3:01:11 PM By: Deon Pilling RN, BSN Entered By: Deon Pilling on 10/31/2021  11:45:33 -------------------------------------------------------------------------------- Patient/Caregiver Education Details Patient Name: Date of Service: Kevin [name], [name] 12/28/2022andnbsp10:30 A M Medical Record Number: [id number] Patient Account Number: [id number] Date of Birth/Gender: Treating RN: 13-Nov-1950 (70 y.o. [name] Primary Care Physician: [name] Other Clinician: Referring Physician: Treating Physician/Extender: Ruffin Pyo in Treatment: 35 Education Assessment Education Provided To: Patient Education Topics Provided Venous: Handouts: Managing Venous Disease and Related Ulcers Methods: Explain/Verbal Responses: Reinforcements needed Electronic Signature(s) Signed: 10/31/2021 3:01:11 PM By: Deon Pilling RN, BSN Entered By: Deon Pilling on 10/31/2021 11:45:13 -------------------------------------------------------------------------------- Wound Assessment Details Patient Name: Date of Service: Kevin [name], [name] 10/31/2021 10:30 A M Medical Record Number: [id number]  Patient Account Number: [id number] Date of Birth/Sex: Treating RN: [birth year] (70 y.o. [name] Primary Care Roosevelt Eimers: Nicoletta Ba Other Clinician: Referring Darnisha Vernet: Treating Gresham Caetano/Extender: Suzzette Righter in Treatment: 301 Wound Status Wound Number: [id number] Primary Etiology: Diabetic Wound/Ulcer of the Lower Extremity Wound Location: Left T Second oe Wound Status: Open Wounding Event: Gradually Appeared Date Acquired: 09/26/2021 Weeks Of Treatment: 5 Clustered Wound: No Wound Measurements Length: (cm) 3 Width: (cm) 3.5 Depth: (cm) 0.1 Area: (cm) 8.247 Volume: (cm) 0.825 % Reduction in Area: 61.1% % Reduction in Volume: 90.3% Wound Description Classification: Grade 2 Exudate Amount: Medium Exudate Type: Serosanguineous Exudate Color: red, brown Treatment Notes Wound #204 (Toe Second) Wound  Laterality: Left Cleanser Peri-Wound Care Topical Primary Dressing KerraCel Ag Gelling Fiber Dressing, 2x2 in (silver alginate) Discharge Instruction: Apply silver alginate to wound bed as instructed Secondary Dressing Woven Gauze Sponges 2x2 in Discharge Instruction: Apply over primary dressing as directed. Secured With [employer] 4x5 (in/yd) Discharge Instruction: Secure with Coban as directed. Compression Wrap Compression Stockings Add-Ons Notes BLE lotion, unna boot upper portion of lower leg, 4 layer compression wraps. Electronic Signature(s) Signed: 10/31/2021 3:01:11 PM By: Shawn Stall RN, BSN Signed: 10/31/2021 3:01:11 PM By: Shawn Stall RN, BSN Entered By: Shawn Stall on 10/31/2021 11:41:49 -------------------------------------------------------------------------------- Vitals Details Patient Name: Date of Service: Kevin [name], [name] 10/31/2021 10:30 A M Medical Record Number: [id number] Patient Account Number: [id number] Date of Birth/Sex: Treating RN: 29-Mar-1951 (70 y.o. Dianna Limbo Primary Care Bode Pieper: Nicoletta Ba Other Clinician: Referring Terrell Shimko: Treating Song Garris/Extender: Suzzette Righter in Treatment: 301 Vital Signs Time Taken: 10:59 Temperature (F): 98 Height (in): 70 Pulse (bpm): 75 Weight (lbs): 380.2 Respiratory Rate (breaths/min): 20 Body Mass Index (BMI): 54.5 Blood Pressure (mmHg): 108/67 Reference Range: 80 - 120 mg / dl Electronic Signature(s) Signed: 10/31/2021 5:15:38 PM By: Karie Schwalbe RN Entered By: Karie Schwalbe on 10/31/2021 17:05:27

## 2021-11-01 ENCOUNTER — Telehealth: Payer: Self-pay

## 2021-11-07 ENCOUNTER — Encounter (HOSPITAL_BASED_OUTPATIENT_CLINIC_OR_DEPARTMENT_OTHER): Payer: Medicare Other | Attending: Physician Assistant | Admitting: Physician Assistant

## 2021-11-07 ENCOUNTER — Other Ambulatory Visit: Payer: Self-pay

## 2021-11-07 DIAGNOSIS — E11622 Type 2 diabetes mellitus with other skin ulcer: Secondary | ICD-10-CM | POA: Insufficient documentation

## 2021-11-07 DIAGNOSIS — L97812 Non-pressure chronic ulcer of other part of right lower leg with fat layer exposed: Secondary | ICD-10-CM | POA: Diagnosis not present

## 2021-11-07 DIAGNOSIS — I890 Lymphedema, not elsewhere classified: Secondary | ICD-10-CM | POA: Diagnosis not present

## 2021-11-07 DIAGNOSIS — I872 Venous insufficiency (chronic) (peripheral): Secondary | ICD-10-CM | POA: Diagnosis not present

## 2021-11-07 DIAGNOSIS — E11621 Type 2 diabetes mellitus with foot ulcer: Secondary | ICD-10-CM | POA: Diagnosis not present

## 2021-11-07 DIAGNOSIS — L97519 Non-pressure chronic ulcer of other part of right foot with unspecified severity: Secondary | ICD-10-CM | POA: Diagnosis not present

## 2021-11-07 DIAGNOSIS — L97522 Non-pressure chronic ulcer of other part of left foot with fat layer exposed: Secondary | ICD-10-CM | POA: Diagnosis not present

## 2021-11-07 DIAGNOSIS — L03115 Cellulitis of right lower limb: Secondary | ICD-10-CM | POA: Diagnosis not present

## 2021-11-07 DIAGNOSIS — I87333 Chronic venous hypertension (idiopathic) with ulcer and inflammation of bilateral lower extremity: Secondary | ICD-10-CM | POA: Insufficient documentation

## 2021-11-07 DIAGNOSIS — L97521 Non-pressure chronic ulcer of other 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 of skin: Secondary | ICD-10-CM | POA: Diagnosis not present

## 2021-11-07 DIAGNOSIS — L03116 Cellulitis of left lower limb: Secondary | ICD-10-CM | POA: Diagnosis not present

## 2021-11-07 DIAGNOSIS — E1151 Type 2 diabetes mellitus with diabetic peripheral angiopathy without gangrene: Secondary | ICD-10-CM | POA: Diagnosis not present

## 2021-11-07 DIAGNOSIS — E1140 Type 2 diabetes mellitus with diabetic neuropathy, unspecified: Secondary | ICD-10-CM | POA: Diagnosis not present

## 2021-11-07 NOTE — Progress Notes (Addendum)
[name], [name] ([id number]) Visit Report for 11/07/2021 Chief Complaint Document Details Patient Name: Date of Service: [name], [name] 11/07/2021 10:30 A M Medical Record Number: [id number] Patient Account Number: [id number] Date of Birth/Sex: Treating RN: [birth year] (71 y.o. [name] Primary Care Provider: [name] Other Clinician: Referring Provider: Treating Provider/Extender: [name] in Treatment: [place] from: Patient Chief Complaint Patient is here for evaluation of venous/lymphedema ulcers Electronic Signature(s) Signed: 11/07/2021 10:53:50 AM By: Worthy Keeler PA-C Entered By: Worthy Keeler on 11/07/2021 10:53:49 -------------------------------------------------------------------------------- HPI Details Patient Name: Date of Service: [name], [name] 11/07/2021 10:30 A M Medical Record Number: [id number] Patient Account Number: [id number] Date of Birth/Sex: Treating RN: [birth year] (71 y.o. [name] Primary Care Provider: [name] Other Clinician: Referring Provider: Treating Provider/Extender: [name] in Treatment: 302 History of Present Illness HPI Description: Referred by PCP for consultation. Patient has long standing history of BLE venous stasis, no prior ulcerations. At beginning of month, developed cellulitis and weeping. Received IM Rocephin followed by Keflex and resolved. Wears compression stocking, appr 6 months old. Not sure strength. No present drainage. 01/22/16 this is a patient who is a type II diabetic on insulin. 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 He tells me he religiously wears pressure stockings of uncertain strength. He was here with weeping edema about 8 months ago but did not have an open wound. Roughly a month ago he had a reopening on his bilateral legs. He is been using bandages and Neosporin. He does  not complain of pain. 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 Last BUN/creatinine I have is from 11/20/15 at 13 and 1.0 respectively 01/29/16; patient arrives today having tolerated the Profore wrap. He brought in his stockings and these are 18 mmHg stockings he bought from [place]. The compression here is likely inadequate. He does not complain of pain or excessive drainage she has no systemic symptoms. The wound on the right looks improved as does the one on the left although one on the left is more substantial with still tissue at risk below the actual wound area on the bilateral posterior calf 02/05/16; patient arrives with poor edema control. He states that we did put a 4 layer compression on it last week. No weight appear 5 this. 02/12/16; the area on the posterior right Has healed. The left Has a substantial wound that has necrotic surface eschar that requires a debridement with a curette. 02/16/16;the patient called or a Nurse visit secondary to increased swelling. He had been in earlier in the week with his right leg healed. He was transitioned to is on pressure stocking on the right leg with the only open wound on the left, a substantial area on the left posterior calf. Note he has a history of severe lower extremity edema, he has a history of chronic atrial fibrillation but not heart failure per my notes but I'll need to research this. He is not complaining of chest pain shortness of breath or orthopnea. The intake nurse noted blisters on the previously closed right leg 02/19/16; this is the patient's regular visit day. I see him on Friday with escalating edema new wounds on the right leg and clear signs of at least right ventricular heart failure. I increased his Lasix to 40 twice a  day. He is returning currently in follow-up. States he is noticed a decrease in that the edema 02/26/16  patient's legs have much less edema. There is nothing really open on the right leg. The left leg has improved condition of the large superficial wound on the posterior left leg 03/04/16; edema control is very much better. The patient's right leg wounds have healed. On the left leg he continues to have severe venous inflammation on the posterior aspect of the left leg. There is no tenderness and I don't think any of this is cellulitis. 03/11/16; patient's right leg is married healed and he is in his own stocking. The patient's left leg has deteriorated somewhat. There is a lot of erythema around the wound on the posterior left leg. There is also a significant rim of erythema posteriorly just above where the wrap would've ended there is a new wound in this location and a lot of tenderness. Can't rule out cellulitis in this area. 03/15/16; patient's right leg remains healed and he is in his own stocking. The patient's left leg is much better than last review. His major wound on the posterior aspect of his left Is almost fully epithelialized. He has 3 small injuries from the wraps. Really. Erythema seems a lot better on antibiotics 03/18/16; right leg remains healed and he is in his own stocking. The patient's left leg is much better. The area on the posterior aspect of the left calf is fully epithelialized. His 3 small injuries which were wrap injuries on the left are improved only one seems still open his erythema has resolved 03/25/16; patient's right leg remains healed and he is in his own stocking. There is no open area today on the left leg posterior leg is completely closed up. His wrap injuries at the superior aspect of his leg are also resolved. He looks as though he has some irritation on the dorsal ankle but this is fully epithelialized without evidence of infection. 03/28/16; we discharged this patient on Monday. Transitioned him into his own stocking. There were problems almost immediately with  uncontrolled swelling weeping edema multiple some of which have opened. He does not feel systemically unwell in particular no chest pain no shortness of breath and he does not feel 04/08/16; the edema is under better control with the Profore light wrap but he still has pitting edema. There is one large wound anteriorly 2 on the medial aspect of his left leg and 3 small areas on the superior posterior calf. Drainage is not excessive he is tolerating a Profore light well 04/15/16; put a Profore wrap on him last week. This is controlled is edema however he had a lot of pain on his left anterior foot most of his wounds are healed 04/22/16 once again the patient has denuded areas on the left anterior foot which he states are because his wrap slips up word. He saw his primary physician today is on Lasix 40 twice a day and states that he his weight is down 20 pounds over the last 3 months. 04/29/16: Much improved. left anterior foot much improved. He is now on Lasix 80 mg per day. Much improved edema control 05/06/16; I was hoping to be able to discharge him today however once again he has blisters at a low level of where the compression was placed last week mostly on his left lateral but also his left medial leg and a small area on the anterior part of the left foot. 05/09/16; apparently the patient  went home after his appointment on 7/4 later in the evening developing pain in his upper medial thigh together with subjective fever and chills although his temperature was not taken. The pain was so intense he felt he would probably have to call 911. However he then remembered that he had leftover doxycycline from a previous round of antibiotics and took these. By the next morning he felt a lot better. He called and spoke to one of our nurses and I approved doxycycline over the phone thinking that this was in relation to the wounds we had previously seen although they were definitely were not. The patient feels a lot  better old fever no chills he is still working. Blood sugars are reasonably controlled 05/13/16; patient is back in for review of his cellulitis on his anterior medial upper thigh. He is taking doxycycline this is a lot better. Culture I did of the nodular area on the dorsal aspect of his foot grew MRSA this also looks a lot better. 05/20/16; the patient is cellulitis on the medial upper thigh has resolved. All of his wound areas including the left anterior foot, areas on the medial aspect of the left calf and the lateral aspect of the calf at all resolved. He has a new blister on the left dorsal foot at the level of the fourth toe this was excised. No evidence of infection 05/27/16; patient continues to complain weeping edema. He has new blisterlike wounds on the left anterior lateral and posterior lateral calf at the top of his wrap levels. The area on his left anterior foot appears better. He is not complaining of fever, pain or pruritus in his feet. 05/30/16; the patient's blisters on his left anterior leg posterior calf all look improved. He did not increase the Lasix 100 mg as I suggested because he was going to run out of his 40 mg tablets. He is still having weeping edema of his toes 06/03/16; I renewed his Lasix at 80 mg once a day as he was about to run out when I last saw him. He is on 80 mg of Lasix now. I have asked him to cut down on the excessive amount of water he was drinking and asked him to drink according to his thirst mechanisms 06/12/2016 -- was seen 2 days ago and was supposed to wear his compression stockings at home but he is developed lymphedema and superficial blisters on the left lower extremity and hence came in for a review 06/24/16; the remaining wound is on his left anterior leg. He still has edema coming from between his toes. There is lymphedema here however his edema is generally better than when I last saw this. He has a history of atrial fibrillation but does not have a  known history of congestive heart failure nevertheless I think he probably has this at least on a diastolic basis. 07/01/16 I reviewed his echocardiogram from January 2017. This was essentially normal. He did not have LVH, EF of 55-60%. His right ventricular function was normal although he did have trivial tricuspid and pulmonic regurgitation. This is not audible on exam however. I increased his Lasix to do massive edema in his legs well above his knees I think in early July. He was also drinking an excessive amount of water at the time. 07/15/16; missed his appointment last week because of the Labor Day holiday on Monday. He could not get another appointment later in the week. Started to feel the wrap digging in superiorly  so we remove the top half and the bottom half of his wrap. He has extensive erythema and blistering superiorly in the left leg. Very tender. Very swollen. Edema in his foot with leaking edema fluid. He has not been systemically unwell 07/22/16; the area on the left leg laterally required some debridement. The medial wounds look more stable. His wrap injury wounds appear to have healed. Edema and his foot is better, weeping edema is also better. He tells me he is meeting with the supplier of the external compression pumps at work 08/05/16; the patient was on vacation last week in [facility]. His wrap is been on for an extended period of time. Also over the weekend he developed an extensive area of tender erythema across his anterior medial thigh. He took to doxycycline yesterday that he had leftover from a previous prescription. The patient complains of weeping edema coming out of his toes 08/08/16; I saw this patient on 10/2. He was tender across his anterior thigh. I put him on doxycycline. He returns today in follow-up. He does not have any open wounds on his lower leg, he still has edema weeping into his toes. 08/12/16; patient was seen back urgently today to follow-up for his  extensive left thigh cellulitis/erysipelas. He comes back with a lot less swelling and erythema pain is much better. I believe I gave him Augmentin and Cipro. His wrap was cut down as he stated a roll down his legs. He developed blistering above the level of the wrap that remained. He has 2 open blisters and 1 intact. 08/19/16; patient is been doing his primary doctor who is increased his Lasix from 40-80 once a day or 80 already has less edema. Cellulitis has remained improved in the left thigh. 2 open areas on the posterior left calf 08/26/16; he returns today having new open blisters on the anterior part of his left leg. He has his compression pumps but is not yet been shown how to use some vital representative from the supplier. 09/02/16 patient returns today with no open wounds on the left leg. Some maceration in his plantar toes 09/10/2016 -- Dr. Dellia Nims had recently discharged him on 09/02/2016 and he has come right back with redness swelling and some open ulcers on his left lower extremity. He says this was caused by trying to apply his compression stockings and he's been unable to use this and has not been able to use his lymphedema pumps. He had some doxycycline leftover and he has started on this a few days ago. 09/16/16; there are no open wounds on his leg on the left and no evidence of cellulitis. 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 He does not complain of symptoms here. I am not clear use using his external compression pumps. 09/23/16; I have not seen this patient in 2 weeks. He canceled his appointment 10 days ago as he was going on vacation. He tells me that on Monday he noticed a large area on his posterior left leg which is been draining copiously and is reopened into a large wound. He is been using ABDs and the external part of his juxtalite, according to our nurse this was not on properly. 10/07/16; Still a substantial  area on the posterior left leg. Using silver alginate 10/14/16; in general better although there is still open area which looks healthy. Still using silver alginate. He reminds me that this happen before he left for [facility]  [place]. T oday while he was showering in the morning. He had been using his juxtalite's 10/21/16; the area on his posterior left leg is fully epithelialized. However he arrives today with a large area of tender erythema in his medial and posterior left thigh just above the knee. I have marked the area. Once again he is reluctant to consider hospitalization. I treated him with oral antibiotics in the past for a similar situation with resolution I think with doxycycline however this area it seems more extensive to me. He is not complaining of fever but does have chills and says states he is thirsty. His blood sugar today was in the 140s at home 10/25/16 the area on his posterior left leg is fully epithelialized although there is still some weeping edema. The large area of tenderness and erythema in his medial and posterior left thigh is a lot less tender although there is still a lot of swelling in this thigh. He states he feels a lot better. He is on doxycycline and Augmentin that I started last week. This will continued until Tuesday, December 26. I have ordered a duplex ultrasound of the left thigh rule out DVT whether there is an abscess something that would need to be drained I would also like to know. 11/01/16; he still has weeping edema from a not fully epithelialized area on his left posterior calf. Most of the rest of this looks a lot better. He has completed his antibiotics. His thigh is a lot better. Duplex ultrasound did not show a DVT in the thigh 11/08/16; he comes in today with more Denuded surface epithelium from the posterior aspect of his calf. There is no real evidence of cellulitis. The superior aspect of his wrap appears to have put quite an indentation in his leg  just below the knee and this may have contributed. He does not complain of pain or fever. We have been using silver alginate as the primary dressing. The area of cellulitis in the right thigh has totally resolved. He has been using his compression stockings once a week 11/15/16; the patient arrives today with more loss of epithelium from the posterior aspect of his left calf. He now has a fairly substantial wound in this area. The reason behind this deterioration isn't exactly clear although his edema is not well controlled. He states he feels he is generally more swollen systemically. He is not complaining of chest pain shortness of breath fever. T me he has an appointment with his primary physician in early February. He is on 80 mg of oral ells Lasix a day. He claims compliance with the external compression pumps. He is not having any pain in his legs similar to what he has with his recurrent cellulitis 11/22/16; the patient arrives a follow-up of his large area on his left lateral calf. This looks somewhat better today. He came in earlier in the week for a dressing change since I saw him a week ago. He is not complaining of any pain no shortness of breath no chest pain 11/28/16; the patient arrives for follow-up of his large area on the left lateral calf this does not look better. In fact it is larger weeping edema. The surface of the wound does not look too bad. We have been using silver alginate although I'm not certain that this is a dressing issue. 12/05/16; again the patient follows up for a large wound on the left lateral and left posterior calf this does not look better. There  continues to be weeping edema necrotic surface tissue. More worrisome than this once again there is erythema below the wound involving the distal Achilles and heel suggestive of cellulitis. He is on his feet working most of the day of this is not going well. We are changing his dressing twice a week to facilitate the  drainage. 12/12/16; not much change in the overall dimensions of the large area on the left posterior calf. This is very inflamed looking. I gave him an. Doxycycline last week does not really seem to have helped. He found the wrap very painful indeed it seems to of dog into his legs superiorly and perhaps around the heel. He came in early today because the drainage had soaked through his dressings. 12/19/16- patient arrives for follow-up evaluation of his left lower extremity ulcers. He states that he is using his lymphedema pumps once daily when there is "no drainage". He admits to not using his lipedema pumps while under current treatment. His blood sugars have been consistently between 150-200. 12/26/16; the patient is not using his compression pumps at home because of the wetness on his feet. I've advised him that I think it's important for him to use this daily. He finds his feet too wet, he can put a plastic bag over his legs while he is in the pumps. Otherwise I think will be in a vicious circle. We are using silver alginate to the major area on his left posterior calf 01/02/17; the patient's posterior left leg has further of all into 3 open wounds. All of them covered with a necrotic surface. He claims to be using his compression pumps once a day. His edema control is marginal. Continue with silver alginate 01/10/17; the patient's left posterior leg actually looks somewhat better. There is less edema, less erythema. Still has 3 open areas covered with a necrotic surface requiring debridement. He claims to be using his compression pumps once a day his edema control is better 01/17/17; the patient's left posterior calf look better last week when I saw him and his wrap was changed 2 days ago. He has noted increasing pain in the left heel and arrives today with much larger wounds extensive erythema extending down into the entire heel area especially tender medially. He is not systemically unwell CBGs have  been controlled no fever. Our intake nurse showed me limegreen drainage on his AVD pads. 01/24/17; his usual this patient responds nicely to antibiotics last week giving him Levaquin for presumed Pseudomonas. The whole entire posterior part of his leg is much better much less inflamed and in the case of his Achilles heel area much less tender. He has also had some epithelialization posteriorly there are still open areas here and still draining but overall considerably better 01/31/17- He has continue to tolerate the compression wraps. he states that he continues to use the lymphedema pumps daily, and can increase to twice daily on the weekends. He is voicing no complaints or concerns regarding his LLE ulcers 02/07/17-he is here for follow-up evaluation. He states that he noted some erythema to the left medial and anterior thigh, which he states is new as of yesterday. He is concerned about recurrent cellulitis. He states his blood sugars have been slightly elevated, this morning in the 180s 02/14/17; he is here for follow-up evaluation. When he was last here there was erythema superiorly from his posterior wound in his anterior thigh. He was prescribed Levaquin however a culture of the wound surface grew MRSA over  the phone I changed him to doxycycline on Monday and things seem to be a lot better. 02/24/17; patient missed his appointment on Friday therefore we changed his nurse visit into a physician visit today. Still using silver alginate on the large area of the posterior left thigh. He isn't new area on the dorsal left second toe 03/03/17; actually better today although he admits he has not used his external compression pumps in the last 2 days or so because of work responsibilities over the weekend. 03/10/17; continued improvement. External compression pumps once a day almost all of his wounds have closed on the posterior left calf. Better edema control 03/17/17; in general improved. He still has 3 small  open areas on the lateral aspect of his left leg however most of the area on the posterior part of his leg is epithelialized. He has better edema control. He has an ABD pad under his stocking on the right anterior lower leg although he did not let us look at that today. 03/24/17; patient arrives back in clinic today with no open areas however there are areas on the posterior left calf and anterior left calf that are less than 100% epithelialized. His edema is well controlled in the left lower leg. There is some pitting edema probably lymphedema in the left upper thigh. He uses compression pumps at home once per day. I tried to get him to do this twice a day although he is very reticent. 04/01/2017 -- for the last 2 days he's had significant redness, tenderness and weeping and came in for an urgent visit today. 04/07/17; patient still has 6 more days of doxycycline. He was seen by Dr. Con Memos last Wednesday for cellulitis involving the posterior aspect, lateral aspect of his Involving his heel. For the most part he is better there is less erythema and less weeping. He has been on his feet for 12 hours 2 over the weekend. Using his compression pumps once a day 04/14/17 arrives today with continued improvement. Only one area on the posterior left calf that is not fully epithelialized. He has intense bilateral venous inflammation associated with his chronic venous insufficiency disease and secondary lymphedema. We have been using silver alginate to the left posterior calf wound In passing he tells [place] today that the right leg but we have not seen in quite some time has an open area on it but he doesn't want [place] to look at this today states he will show this to [place] next week. 04/21/17; there is no open area on his left leg although he still reports some weeping edema. He showed [place] his right leg today which is the first time we've seen this leg in a long time. He has a large area of open wound on the right leg  anteriorly healthy granulation. Quite a bit of swelling in the right leg and some degree of venous inflammation. He told us about the right leg in passing last week but states that deterioration in the right leg really only happened over the weekend 04/28/17; there is no open area on the left leg although there is an irritated part on the posterior which is like a wrap injury. The wound on the right leg which was new from last week at least to [place] is a lot better. 05/05/17; still no open area on the left leg. Patient is using his new compression stocking which seems to be doing a good job of controlling the edema. He states he is using his  compression pumps once per day. The right leg still has an open wound although it is better in terms of surface area. Required debridement. A lot of pain in the posterior right Achilles marked tenderness. Usually this type of presentation this patient gives concern for an active cellulitis 05/12/17; patient arrives today with his major wound from last week on the right lateral leg somewhat better. Still requiring debridement. He was using his compression stocking on the left leg however that is reopened with superficial wounds anteriorly he did not have an open wound on this leg previously. He is still using his juxta light's once daily at night. He cannot find the time to do this in the morning as he has to be at work by 7 AM 05/19/17; right lateral leg wound looks improved. No debridement required. The concerning area is on the left posterior leg which appears to almost have a subcutaneous hemorrhagic component to it. We've been using silver alginate to all the wounds 05/26/17; the right lateral leg wound continues to look improved. However the area on the left posterior calf is a tightly adherent surface. [place] using silver alginate. Because of the weeping edema in his legs there is very little good alternatives. 06/02/17; the patient left here last week looking quite  good. Major wound on the left posterior calf and a small one on the right lateral calf. Both of these look satisfactory. He tells me that by Wednesday he had noted increased pain in the left leg and drainage. He called on Thursday and Friday to get an appointment here but we were blocked. He did not go to urgent care or his primary physician. He thinks he had a fever on Thursday but did not actually take his temperature. He has not been using his compression pumps on the left leg because of pain. I advised him to go to the emergency room today for IV antibiotics for stents of left leg cellulitis but he has refused I have asked him to take 2 days off work to keep his leg elevated and he has refused this as well. In view of this I'm going to call him and Augmentin and doxycycline. 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 However after a detailed discussion regarding the risk benefits and alternatives he refuses to get admitted to the hospital. With no other recourse I will continue him on oral antibiotics as before and hopefully he'll have his infectious disease consultation this week. 06/16/2017 -- the patient was seen today by the nurse practitioner at infectious disease Ms. Dixon. 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 He has also been advise Lotrimin cream locally. she also had wise IV antibiotics for his condition if it flares up 06/23/17; patient arrives today with drainage bilaterally although the remaining wound on the left posterior calf after cleaning up today "highlighter  yellow drainage" did not look too bad. Unfortunately he has had breakdown on the right anterior leg [previously this leg had not been open and he is using a black stocking] he went to see infectious  disease and is been put on clindamycin 150 mg daily, I did not verify the dose although I'm not familiar with using clindamycin in this dosing range, perhaps for prophylaxisoo 06/27/17; I brought this patient back today to follow-up on the wound deterioration on the right lower leg together with surrounding cellulitis. I started him on doxycycline 4 days ago. This area looks better however he comes in today with intense cellulitis on the medial part of his left thigh. This is not have a wound in this area. Extremely tender. We've been using silver alginate to the wounds on the right lower leg left lower leg with bilateral 4 layer compression he is using his external compression pumps once a day 07/04/17; patient's left medial thigh cellulitis looks better. He has not been using his compression pumps as his insert said it was contraindicated with cellulitis. His right leg continues to make improvements all the wounds are still open. We only have one remaining wound on the left posterior calf. Using silver alginate to all open areas. He is on doxycycline which I started a week ago and should be finishing I gave him Augmentin after Thursday's visit for the severe cellulitis on the left medial thigh which fortunately looks better 07/14/17; the patient's left medial thigh cellulitis has resolved. The cellulitis in his right lower calf on the right also looks better. All of his wounds are stable to improved we've been using silver alginate he has completed the antibiotics I have given him. He has clindamycin 150 mg once a day prescribed by infectious disease for prophylaxis, I've advised him to start this now. We have been using bilateral Unna boots over silver alginate to the wound areas 07/21/17; the patient is  been to see infectious disease who noted his recurrent problems with cellulitis. He was not able to tolerate prophylactic clindamycin therefore he is on amoxicillin 500 twice a day. He also had a second daily dose of Lasix added By Dr. Melford Aase but he is not taking this. Nor is he being completely compliant with his compression pumps a especially not this week. He has 2 remaining wounds one on the right posterior lateral lower leg and one on the left posterior medial lower leg. 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 The patient has Unna boots bilaterally. 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 He has bilateral Unna boots for compression and silver alginate to his wounds. Arrives today with his legs looking as good as I have seen him in quite some time. Not surprisingly his wounds look better as well with improvement on the right lateral leg venous insufficiency wound and also the left medial leg. He is still using the compression pumps once a day 08/11/17; both legs appear to be doing better wounds on the right lateral and left medial legs look better. Skin on the right leg quite good. He is been using silver alginate as the primary dressing. I'm going to use Anasept gel calcium alginate and maintain all the secondary dressings 08/18/17; the patient continues to actually do quite well. The area on his right lateral leg is just about closed the left medial also looks better although it is still moist in this area. His edema is well controlled we have been using Anasept gel with calcium alginate  and the usual secondary dressings, 4 layer compression and once daily use of his compression pumps "always been able to manage 09/01/17; the patient continues to do reasonably well in  spite of his trip to [place]. The area on the right lateral leg is epithelialized. Left is much better but still open. He has more edema and more chronic erythema on the left leg [venous inflammation] 09/08/17; he arrives today with no open wound on the right lateral leg and decently controlled edema. Unfortunately his left leg is not nearly as in his good situation as last week.he apparently had increasing edema starting on Saturday. He edema soaked through into his foot so used a plastic bag to walk around his home. The area on the medial right leg which was his open area is about the same however he has lost surface epithelium on the left lateral which is new and he has significant pain in the Achilles area of the left foot. He is already on amoxicillin chronically for prophylaxis of cellulitis in the left leg 09/15/17; he is completed a week of doxycycline and the cellulitis in the left posterior leg and Achilles area is as usual improved. He still has a lot of edema and fluid soaking through his dressings. There is no open wound on the right leg. He saw infectious disease NP today 09/22/17;As usual 1 we transition him from our compression wraps to his stockings things did not go well. He has several small open areas on the right leg. He states this was caused by the compression wrap on his skin although he did not wear this with the stockings over them. He has several superficial areas on the left leg medially laterally posteriorly. He does not have any evidence of active cellulitis especially involving the left Achilles The patient is traveling from [facility] Saturday going to [facility]. He states he isn't attempting to get an appointment with a heel objects wound center there to change his dressings. I am not completely certain whether this will work 10/06/17; the patient came in on Friday for a nurse visit and the nurse reported that his legs actually look quite good. He arrives in clinic  today for his regular follow-up visit. He has a new wound on his left third toe over the PIP probably caused by friction with his footwear. He has small areas on the left leg and a very superficial but epithelialized area on the right anterior lateral lower leg. Other than that his legs look as good as I've seen him in quite some time. We have been using silver alginate Review of systems; no chest pain no shortness of breath other than this a 10 point review of systems negative 10/20/17; seen by Dr. Con Memos last week. He had taken some antibiotics [doxycycline] that he had left over. Dr. Con Memos thought he had candida infection and declined to give him further antibiotics. He has a small wound remaining on the right lateral leg several areas on the left leg including a larger area on the left posterior several left medial and anterior and a small wound on the left lateral. The area on the left dorsal third toe looks a lot better. ROS; Gen.; no fever, respiratory no cough no sputum Cardiac no chest pain other than this 10 point review of system is negative 10/30/17; patient arrives today having fallen in the bathtub 3 days ago. It took him a while to get up. He has pain and maceration in the wounds on his  left leg which have deteriorated. He has not been using his pumps he also has some maceration on the right lateral leg. 11/03/17; patient continues to have weeping edema especially in the left leg. This saturates his dressings which were just put on on 12/27. As usual the doxycycline seems to take care of the cellulitis on his lower leg. He is not complaining of fever, chills, or other systemic symptoms. He states his leg feels a lot better on the doxycycline I gave him empirically. He also apparently gets injections at his primary doctor's officeo Rocephin for cellulitis prophylaxis. I didn't ask him about his compression pump compliance today I think that's probably marginal. Arrives in the clinic  with all of his dressings primary and secondary macerated full of fluid and he has bilateral edema 11/10/17; the patient's right leg looks some better although there is still a cluster of wounds on the right lateral. The left leg is inflamed with almost circumferential skin loss medially to laterally although we are still maintaining anteriorly. He does not have overt cellulitis there is a lot of drainage. He is not using compression pumps. We have been using silver alginate to the wound areas, there are not a lot of options here 11/17/17; the patient's right leg continues to be stable although there is still open wounds, better than last week. The inflammation in the left leg is better. Still loss of surface layer epithelium especially posteriorly. There is no overt cellulitis in the amount of edema and his left leg is really quite good, tells me he is using his compression pumps once a day. 11/24/17; patient's right leg has a small superficial wound laterally this continues to improve. The inflammation in the left leg is still improving however we have continuous surface layer epithelial loss posteriorly. There is no overt cellulitis in the amount of edema in both legs is really quite good. He states he is using his compression pumps on the left leg once a day for 5 out of 7 days 12/01/17; very small superficial areas on the right lateral leg continue to improve. Edema control in both legs is better today. He has continued loss of surface epithelialization and left posterior calf although I think this is better. We have been using silver alginate with large number of absorptive secondary dressings 4 layer on the left Unna boot on the right at his request. He tells me he is using his compression pumps once a day 12/08/17; he has no open area on the right leg is edema control is good here. On the left leg however he has marked erythema and tenderness breakdown of skin. He has what appears to be a wrap injury  just distal to the popliteal fossa. 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 There was swelling erythema and breakdown in the right leg. He has much worse skin breakdown in the left leg as well multiple open areas medially and posteriorly as well as laterally. He tells me he has been using his compression pumps but tells me he feels that the drainage out of his leg is worse when he uses a compression pumps. T be fair to him he is been saying this o for a while however I  don't know that I have really been listening to this. I wonder if the compression pumps are working properly 12/22/17;. Once again he arrives with severe erythema, weeping edema from the left greater than right leg. Noncompliance with compression pumps. New this visit he is complaining of pain on the lateral aspect of the right leg and the medial aspect of his right thigh. He apparently saw his cardiologist Dr. Debara Pickett who was ordered an echocardiogram area and I think this is a step in the right direction 12/25/17; started his doxycycline Monday night. There is still intense erythema of the right leg especially in the anterior thigh although there is less tenderness. The erythema around the wound on the right lateral calf also is less tender. He still complaining of pain in the left heel. His wounds are about the same right lateral left medial left lateral. Superficial but certainly not close to closure. He denies being systemically unwell no fever chills no abdominal pain no diarrhea 12/29/17; back in follow-up of his extensive right calf and right thigh cellulitis. I added amoxicillin to cover possible doxycycline resistant strep. This seems to of done the  trick he is in much less pain there is much less erythema and swelling. He has his echocardiogram at 11:00 this morning. X-ray of the left heel was also negative. 01/05/18; the patient arrived with his edema under much better control. Now that he is retired he is able to use his compression pumps daily and sometimes twice a day per the patient. He has a wound on the right leg the lateral wound looks better. Area on the left leg also looks a lot better. He has no evidence of cellulitis in his bilateral thighs I had a quick peak at his echocardiogram. He is in normal ejection fraction and normal left ventricular function. He has moderate pulmonary hypertension moderately reduced right ventricular function. One would have to wonder about chronic sleep apnea although he says he doesn't snore. He'll review the echocardiogram with his cardiologist. 01/12/18; the patient arrives with the edema in both legs under exemplary control. He is using his compression pumps daily and sometimes twice daily. His wound on the right lateral leg is just about closed. He still has some weeping areas on the posterior left calf and lateral left calf although everything is just about closed here as well. I have spoken with Jeri Lager who is the patient's nurse practitioner and infectious disease. She was concerned that the patient had not understood that the parenteral penicillin injections he was receiving for cellulitis prophylaxis was actually benefiting him. I don't think the patient actually saw that I would tend to agree we were certainly dealing with less infections although he had a serious one last month. 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 He is healed. He'll be placed in juxtalite compression wraps and increase his lymphedema pumps to twice daily. We will follow up again next week to ensure there are no issues with the new regiment. 01/20/18-he is here for evaluation of  bilateral lower extremity weeping edema. Yesterday he was placed in compression wrap to the right lower extremity and compression stocking to left lower shrubbery. He states he uses lymphedema pumps last night and again this morning and noted a blister to the left lower extremity. On exam he was noted to have drainage to the right lower extremity. He will be placed in Unna boots bilaterally and follow-up next week 01/26/18; patient was actually discharged a  week ago to his own juxta light stockings only to return the next day with bilateral lower extremity weeping edema.he was placed in bilateral Unna boots. He arrives today with pain in the back of his left leg. There is no open area on the right leg however there is a linear/wrap injury on the left leg and weeping edema on the left leg posteriorly. I spoke with infectious disease about 10 days ago. They were disappointed that the patient elected to discontinue prophylactic intramuscular penicillin shots as they felt it was particularly beneficial in reducing the frequency of his cellulitis. I discussed this with the patient today. He does not share this view. He'll definitely need antibiotics today. Finally he is traveling to [place] and trauma leaving this Saturday and returning a week later and he does not travel with his pumps. He is going by car 01/30/18; patient was seen 4 days ago and brought back in today for review of cellulitis in the left leg posteriorly. I put him on amoxicillin this really hasn't helped as much as I might like. He is also worried because he is traveling to [facility] trauma by car. Finally we will be rewrapping him. There is no open area on the right leg over his left leg has multiple weeping areas as usual 02/09/18; The same wrap on for 10 days. He did not pick up the last doxycycline I prescribed for him. He apparently took 4 days worth he already had. There is nothing open on his right leg and the edema control is really  quite good. He's had damage in the left leg medially and laterally especially probably related to the prolonged use of Unna boots 02/12/18; the patient arrived in clinic today for a nurse visit/wrap change. He complained of a lot of pain in the left posterior calf. He is taking doxycycline that I previously prescribed for him. Unfortunately even though he used his stockings and apparently used to compression pumps twice a day he has weeping edema coming out of the lateral part of his right leg. This is coming from the lower anterior lateral skin area. 02/16/18; the patient has finished his doxycycline and will finish the amoxicillin 2 days. The area of cellulitis in the left calf posteriorly has resolved. He is no longer having any pain. He tells me he is using his compression pumps at least once a day sometimes twice. 02/23/18; the patient finished his doxycycline and Amoxil last week. On Friday he noticed a small erythematous circle about the size of a quarter on the left lower leg just above his ankle. This rapidly expanded and he now has erythema on the lateral and posterior part of the thigh. This is bright red. Also has an area on the dorsal foot just above his toes and a tender area just below the left popliteal fossa. He came off his prophylactic penicillin injections at his own insistence one or 2 months ago. This is obviously deteriorated since then 03/02/18; patient is on doxycycline and Amoxil. Culture I did last week of the weeping area on the back of his left calf grew group B strep. I have therefore renewed the amoxicillin 500 3 times a day for a further week. He has not been systemically unwell. Still complaining of an area of discomfort right under his left popliteal fossa. There is no open wound on the right leg. He tells me that he is using his pumps twice a day on most days 03/09/18; patient arrives in clinic today completing  his amoxicillin today. The cellulitis on his left leg is  better. Furthermore he tells me that he had intramuscular penicillin shots that his primary care office today. However he also states that the wrap on his right leg fell down shortly after leaving clinic last week. He developed a large blister that was present when he came in for a nurse visit later in the week and then he developed intense discomfort around this area.He tells me he is using his compression pumps 03/16/18; the patient has completed his doxycycline. The infectious part of this/cellulitis in the left heel area left popliteal area is a lot better. He has 2 open areas on the right calf. Still areas on the left calf but this is a lot better as well. 03/24/18; the patient arrives complaining of pain in the left popliteal area again. He thinks some of this is wrap injury. He has no open area on the right leg and really no open area on the left calf either except for the popliteal area. He claims to be compliant with the compression pumps 03/31/18; I gave him doxycycline last week because of cellulitis in the left popliteal area. This is a lot better although the surface epithelium is denuded off and response to this. He arrives today with uncontrolled edema in the right calf area as well as a fingernail injury in the right lateral calf. There is only a few open areas on the left 04/06/18; I gave him amoxicillin doxycycline over the last 2 weeks that the amoxicillin should be completing currently. He is not complaining of any pain or systemic symptoms. The only open areas see has is on the right lateral lower leg paradoxically I cannot see anything on the left lower leg. He tells me he is using his compression pumps twice a day on most days. Silver alginate to the wounds that are open under 4 layer compression 04/13/18; he completed antibiotics and has no new complaints. Using his compression pumps. Silver alginate that anything that's opened 04/20/18; he is using his compression pumps religiously.  Silver alginate 4 layer compression anything that's opened. He comes in today with no open wounds on the left leg but 3 on the right including a new one posteriorly. He has 2 on the right lateral and one on the right posterior. He likes Unna boots on the right leg for reasons that aren't really clear we had the usual 4 layer compression on the left. It may be necessary to move to the 4 layer compression on the right however for now I left them in the Unna boots 04/27/18; he is using his compression pumps at least once a day. He has still the wounds on the right lateral calf. The area right posteriorly has closed. He does not have an open wound on the left under 4 layer compression however on the dorsal left foot just proximal to the toes and the left third toe 2 small open areas were identified 05/11/18; he has not uses compression pumps. The areas on the right lateral calf have coalesced into one large wound necrotic surface. On the left side he has one small wound anteriorly however the edema is now weeping out of a large part of his left leg. He says he wasn't using his pumps because of the weeping fluid. I explained to him that this is the time he needs to pump more 05/18/18; patient states he is using his compression pumps twice a day. The area on the right lateral  large wound albeit superficial. On the left side he has innumerable number of small new wounds on the left calf particularly laterally but several anteriorly and medially. All these appear to have healthy granulated base these look like the remnants of blisters however they occurred under compression. The patient arrives in clinic today with his legs somewhat better. There is certainly less edema, less multiple open areas on the left calf and the right anterior leg looks somewhat better as well superficial and a little smaller. However he relates pain and erythema over the last 3-4 days in the thigh and I looked at this today. He has not  been systemically unwell no fever no chills no change in blood sugar values 05/25/18; comes in today in a better state. The severe cellulitis on his left leg seems better with the Keflex. Not as tender. He has not been systemically unwell Hard to find an open wound on the left lower leg using his compression pumps twice a day The confluent wounds on his right lateral calf somewhat better looking. These will ultimately need debridement I didn't do this today. 06/01/18; the severe cellulitis on the left anterior thigh has resolved and he is completed his Keflex. There is no open wound on the left leg however there is a superficial excoriation at the base of the third toe dorsally. Skin on the bottom of his left foot is macerated looking. The left the wounds on the lateral right leg actually looks some better although he did require debridement of the top half of this wound area with an open curet 06/09/18 on evaluation today patient appears to be doing poorly in regard to his right lower extremity in particular this appears to likely be infected he has very thick purulent discharge along with a bright green tent to the discharge. This makes me concerned about the possibility of pseudomonas. He's also having increased discomfort at this point on evaluation. Fortunately there does not appear to be any evidence of infection spreading to the other location at this time. 06/16/18 on evaluation today patient appears to actually be doing fairly well. His ulcer has actually diminished in size quite significantly at this point which is good news. Nonetheless he still does have some evidence of infection he did see infectious disease this morning before coming here for his appointment. I did review the results of their evaluation and their note today. They did actually have him discontinue the Cipro and initiate treatment with linezolid at this time. He is doing this for the next seven days and they recommended a  follow-up in four months with them. He is the keep a log of the need for intermittent antibiotic therapy between now and when he falls back up with infectious disease. This will help them gaze what exactly they need to do to try and help them out. 06/23/18; the patient arrives today with no open wounds on the left leg and left third toe healed. He is been using his compression pumps twice a day. On the right lateral leg he still has a sizable wound but this is a lot better than last time I saw this. 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 Has been using silver alginate under 4 layer compression 06/30/18; the only open wound he has is on the right lateral leg and this looks healthy. No debridement is required. We have been using silver  alginate. He does not have an open wound on the left leg. There is apparently some drainage from the dorsal proximal third toe on the left although I see no open wound here. 07/03/18 on evaluation today patient was actually here just for a nurse visit rapid change. However when he was here on Wednesday for his rat change due to having been healed on the left and then developing blisters we initiated the wrap again knowing that he would be back today for [place] to reevaluate and see were at. Unfortunately he has developed some cellulitis into the proximal portion of his right lower extremity even into the region of his thigh. 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 He was placed on one as what at that point. Nonetheless he is done with that and has been tolerating it well otherwise. Doxycycline which in the past really did not seem to be effective for him. Nonetheless I think the best option may be for [place] to definitely reinitiate the antibiotics for a longer period of time. 07/07/18; since I last saw this patient a week ago he has  had a difficult time. At that point he did not have an open wound on his left leg. We transitioned him into juxta light stockings. He was apparently in the clinic the next day with blisters on the left lateral and left medial lower calf. He also had weeping edema fluid. He was put back into a compression wrap. He was also in the clinic on Friday with intense erythema in his right thigh. Per the patient he was started on Bactrim however that didn't work at all in terms of relieving his pain and swelling. He has taken 3 doxycycline that he had left over from last time and that seems to of helped. He has blistering on the right thigh as well. 07/14/18; the erythema on his right thigh has gotten better with doxycycline that he is finishing. 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 Presumably this cellulitis in the thigh was not related to that although I think this is a bit concerning going forward. He still has an area on the right lateral calf the blister on the right medial calf just below the knee that was discussed above. On the left 2 small open areas left medial and left lateral. Edema control is adequate. He is using his compression pumps twice a day 07/20/18; continued improvement in the condition of both legs especially the edema in his bilateral thighs. He tells me he is been losing weight through a combination of diet and exercise. He is using his compression pumps twice a day. So overall she made to the remaining wounds 07/27/2018; continued improvement in condition of both legs. His edema is well controlled. The area on the right lateral leg is just about closed he had one blisters show up on the medial left upper calf. We have him in 4 layer compression. He is going on a 10-day trip to [place], [place] and [place]. He will be driving. He wants to wear Unna boots because of the lessening amount of constriction.  He will not use compression pumps while he is away 08/05/18 on evaluation today patient actually appears to be doing decently well all things considered in regard to his bilateral lower extremities. The worst ulcer is actually only posterior aspect of his left lower  extremity with a four layer compression wrap cut into his leg a couple weeks back. He did have a trip and actually had [employer] for the trip that he is worn since he was last here. Nonetheless he feels like the [employer] actually do better for him his swelling is up a little bit but he also with his trip was not taking his Lasix on a regular set schedule like he was supposed to be. He states that obviously the reason being that he cannot drive and keep going without having to urinate too frequently which makes it difficult. He did not have his pumps with him while he was away either which I think also maybe playing a role here too. 08/13/2018; the patient only has a small open wound on the right lateral calf which is a big improvement in the last month or 2. He also has the area posteriorly just below the posterior fossa on the left which I think was a wrap injury from several weeks ago. He has no current evidence of cellulitis. He tells me he is back into his compression pumps twice a day. He also tells me that while he was at the [facility] somebody stole a section of his extremitease stockings 08/20/2018; back in the clinic with a much improved state. He only has small areas on the right lateral mid calf which is just about healed. This was is more substantial area for quite a prolonged period of time. He has a small open area on the left anterior tibia. The area on the posterior calf just below the popliteal fossa is closed today. He is using his compression pumps twice a day 08/28/2018; patient has no open wound on the right leg. He has a smattering of open areas on the calf with some weeping lymphedema. More  problematically than that it looks as though his wraps of slipped down in his usual he has very angry upper area of edema just below the right medial knee and on the right lateral calf. He has no open area on his feet. The patient is traveling to [facility] next week. I will send him in an antibiotic. We will continue to wrap the right leg. We ordered extremitease stockings for him last week and I plan to transition the right leg to a stocking when he gets home which will be in 10 days time. As usual he is very reluctant to take his pumps with him when he travels 09/07/2018; patient returns from [facility]. He shows me a picture of his left leg in the mid part of his trip last week with intense fire engine erythema. The picture look bad enough I would have considered sending him to the hospital. Instead he went to the wound care center in [facility]. They did not prescribe him antibiotics but he did take some doxycycline he had leftover from a previous visit. I had given him trimethoprim sulfamethoxazole before he left this did not work according to the patient. This is resulted in some improvement fortunately. He comes back with a large wound on the left posterior calf. Smaller area on the left anterior tibia. Denuded blisters on the dorsal left foot over his toes. Does not have much in the way of wounds on the right leg although he does have a very tender area on the right posterior area just below the popliteal fossa also suggestive of infection. He promises me he is back on his pumps twice a  day 09/15/2018; the intense cellulitis in his left lower calf is a lot better. The wound area on the posterior left calf is also so better. However he has reasonably extensive wounds on the dorsal aspect of his second and third toes and the proximal foot just at the base of the toes. There is nothing open on the right leg 09/22/2018; the patient has excellent edema control in his legs  bilaterally. He is using his external compression pumps twice a day. He has no open area on the right leg and only the areas in the left foot dorsally second and third toe area on the left side. He does not have any signs of active cellulitis. 10/06/2018; the patient has good edema control bilaterally. He has no open wound on the right leg. There is a blister in the posterior aspect of his left calf that we had to deal with today. He is using his compression pumps twice a day. There is no signs of active cellulitis. We have been using silver alginate to the wound areas. He still has vulnerable areas on the base of his left first second toes dorsally He has a his extremities stockings and we are going to transition him today into the stocking on the right leg. He is cautioned that he will need to continue to use the compression pumps twice a day. If he notices uncontrolled edema in the right leg he may need to go to 3 times a day. 10/13/2018; the patient came in for a nurse check on Friday he has a large flaccid blister on the right medial calf just below the knee. We unroofed this. He has this and a new area underneath the posterior mid calf which was undoubtedly a blister as well. He also has several small areas on the right which is the area we put his extremities stocking on. 10/19/2018; the patient went to see infectious disease this morning I am not sure if that was a routine follow-up in any case the doxycycline I had given him was discontinued and started on linezolid. He has not started this. It is easy to look at his left calf and the inflammation and think this is cellulitis however he is very tender in the tissue just below the popliteal fossa and I have no doubt that there is infection going on here. He states the problem he is having is that with the compression pumps the edema goes down and then starts walking the wrap falls down. We will see if we can adhere this. He has 1 or 2 minuscule  open areas on the right still areas that are weeping on the posterior left calf, the base of his left second and third toes 10/26/18; back today in clinic with quite of skin breakdown in his left anterior leg. This may have been infection the area below the popliteal fossa seems a lot better however tremendous epithelial loss on the left anterior mid tibia area over quite inexpensive tissue. He has 2 blisters on the right side but no other open wound here. 10/29/2018; came in urgently to see [place] today and we worked him in for review. He states that the 4 layer compression on the right leg caused pain he had to cut it down to roughly his mid calf this caused swelling above the wrap and he has blisters and skin breakdown today. As a result of the pain he has not been using his pumps. Both legs are a lot more edematous and there  is a lot of weeping fluid. 11/02/18; arrives in clinic with continued difficulties in the right leg> left. Leg is swollen and painful. multiple skin blisters and new open areas especially laterally. He has not been using his pumps on the right leg. 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 He is not systemically unwell. 11/09/2018; the patient claims he is being compliant with his pumps. He is finished the doxycycline I gave him last week. Culture I did of the wound on the right lateral leg showed a few very resistant methicillin staph aureus. This was resistant to doxycycline. Nevertheless he states the pain in the leg is a lot better which makes me wonder if the cultured organism was not really what was causing the problem nevertheless this is a very dangerous organism to be culturing out of any wound. His right leg is still a lot larger than the left. He is using an Haematologist on this area, he blames a 4-layer compression for causing the original skin breakdown which I doubt is true however I cannot talk him out of it. We have been  using silver alginate to all of these areas which were initially blisters 11/16/2018; patient is being compliant with his external compression pumps at twice a day. Miraculously he arrives in clinic today with absolutely no open wounds. He has better edema control on the left where he has been using 4 layer compression versus wound of wounds on the right and I pointed this out to him. There is no inflammation in the skin in his lower legs which is also somewhat unusual for him. There is no open wounds on the dorsal left foot. He has extremitease stockings at home and I have asked him to bring these in next week. 11/25/18 patient's lower extremity on examination today on the left appears for the most part to be wound free. He does have an open wound on the lateral aspect of the right lower extremity but this is minimal compared to what I've seen in past. He does request that we go ahead and wrap the left leg as well even though there's nothing open just so hopefully it will not reopen in short order. 1/28; patient has superficial open wounds on the right lateral calf left anterior calf and left posterior calf. His edema control is adequate. 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 We have been using silver alginate as the primary dressing. He claims compliance with his compression pumps 2/4; patient has superficial open wounds on numerous areas of his left calf and again one on the left dorsal foot. The areas on the right lateral calf have healed. The cellulitis that I gave him doxycycline for last week is also resolved this was mostly on the left anterior calf just below the tibial tuberosity. His edema looks fairly well-controlled. He tells me he went to see his primary doctor today and had blood work ordered 2/11; once again he has several open areas on the left calf left tibial area. Most of these are small and  appear to have healthy granulation. He does not have anything open on the right. The edema and control in his thighs is pretty good which is usually a good indication he has been using his pumps as requested. 2/18; he continues to have several small areas on the left calf and left tibial area. Most of  these are small healthy granulation. We put him in his stocking on the right leg last week and he arrives with a superficial open area over the right upper tibia and a fairly large area on the right lateral tibia in similar condition. His edema control actually does not look too bad, he claims to be using his compression pumps twice a day 2/25. Continued small areas on the left calf and left tibial area. New areas especially on the right are identified just below the tibial tuberosity and on the right upper tibia itself. There are also areas of weeping edema fluid even without an obvious wound. He does not have a considerable degree of lymphedema but clearly there is more edema here than his skin can handle. He states he is using the pumps twice a day. We have an Unna boot on the right and 4 layer compression on the left. 3/3; he continues to have an area on the right lateral calf and right posterior calf just below the popliteal fossa. There is a fair amount of tenderness around the wound on the popliteal fossa but I did not see any evidence of cellulitis, could just be that the wrap came down and rubbed in this area. He does not have an open area on the left leg however there is an area on the left dorsal foot at the base of the third toe We have been using silver alginate to all wound areas 3/10; he did not have an open area on his left leg last time he was here a week ago. T oday he arrives with a horizontal wound just below the tibial tuberosity and an area on the left lateral calf. He has intense erythema and tenderness in this area. The area is on the right lateral calf and right posterior calf  better than last week. We have been using silver alginate as usual 3/18 - Patient returns with 3 small open areas on left calf, and 1 small open area on right calf, the skin looks ok with no significant erythema, he continues the UNA boot on right and 4 layer compression on left. The right lateral calf wound is closed , the right posterior is small area. we will continue silver alginate to the areas. 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 He is been tolerating the dressing changes without complication. Fortunately she has made excellent improvement in regard to the overall status of his wounds. Unfortunately every time we cease wrapping him he ends up reopening in causing more significant issues at that point. Again I'm unsure of the best direction to take although I think the lymphedema clinic may be appropriate for him. 02/03/19 on evaluation today patient appears to be doing well in regard to the wounds that we saw him for last week unfortunately he has a new area on the proximal portion of his right medial/posterior lower extremity where the wrap somewhat slowed down and caused swelling and a blister to rub and open. Unfortunately this is the only opening that he has on either leg at this point. 02/17/19 on evaluation today patient's bilateral lower extremities appear to be doing well. He still completely healed in regard to the left lower extremity. In regard to the right lower extremity the area where the wrap and slid down and caused the  blister still seems to be slightly open although this is dramatically better than during the last evaluation two weeks ago. I'm very pleased with the way this stands overall. 03/03/19 on evaluation today patient appears to be doing well in regard to his right lower  extremity in general although he did have a new blister open this does not appear to be showing any evidence of active infection at this time. Fortunately there's No fevers, chills, nausea, or vomiting noted at this time. Overall I feel like he is making good progress it does feel like that the right leg will we perform the [employer] seems to do with a bit better than three layer wrap on the left which slid down on him. We may switch to doing bilateral in the book wraps. 5/4; I have not seen [name] in quite some time. According to our case manager he did not have an open wound on his left leg last week. He had 1 remaining wound on the right posterior medial calf. He arrives today with multiple openings on the left leg probably were blisters and/or wrap injuries from Unna boots. I do not think the Unna boot's will provide adequate compression on the left. I am also not clear about the frequency he is using the compression pumps. 03/17/19 on evaluation today patient appears to be doing excellent in regard to his lower extremities compared to last week's evaluation apparently. He had gotten significantly worse last week which is unfortunate. The [employer] wrap on the left did not seem to do very well for him at all and in fact it didn't control his swelling significantly enough he had an additional outbreak. Subsequently we go back to the four layer compression wrap on the left. This is good news. At least in that he is doing better and the wound seem to be killing him. He still has not heard anything from the lymphedema clinic. 03/24/19 on evaluation today patient actually appears to be doing much better in regard to his bilateral lower Trinity as compared to last week when I saw him. Fortunately there's no signs of active infection at this time. He has been tolerating the dressing changes without complication. Overall I'm extremely pleased with the progress and appearance in general. 04/07/19 on  evaluation today patient appears to be doing well in regard to his bilateral lower extremities. His swelling is significantly down from where it was previous. With that being said he does have a couple blisters still open at this point but fortunately nothing that seems to be too severe and again the majority of the larger openings has healed at this time. 04/14/19 on evaluation today patient actually appears to be doing quite well in regard to his bilateral lower extremities in fact I'm not even sure there's anything significantly open at this time at any site. Nonetheless he did have some trouble with these wraps where they are somewhat irritating him secondary to the fact that he has noted that the graph wasn't too close down to the end of this foot in a little bit short as well up to his knee. Otherwise things seem to be doing quite well. 04/21/19 upon evaluation today patient's wound bed actually showed evidence of being completely healed in regard to both lower extremities which is excellent news. There does not appear to be any signs of active infection which is also good news. I'm very pleased in this regard. No fevers, chills, nausea, or vomiting noted at this time.  04/28/19 on evaluation today patient appears to be doing a little bit worse in regard to both lower extremities on the left mainly due to the fact that when he went infection disease the wrap was not wrapped quite high enough he developed a blister above this. On the right he is a small open area of nothing too significant but again this is continuing to give him some trouble he has been were in the Velcro compression that he has at home. 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 He's been tolerating the dressing changes without complication. Fortunately there's no signs of active infection at this time. No fevers, chills, nausea, or vomiting noted at this time. We have  been trying to get an appointment with her lymphedema clinic [place] [facility] but unfortunately nobody can get them on phone with not been able to even fax information over the patient likewise is not been able to get in touch with them. Overall I'm not sure exactly what's going on here with to reach out again today. 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 Still having a lot of drainage unfortunately. He tells me especially in the left but even on the right. There's no signs of active infection which is good news we've been using so ratcheted up to this point. 05/19/19 on evaluation today patient actually appears to be doing quite well with regard to his left lower extremity which is great news. Fortunately in regard to the right lower extremity has an issues with his wrap and he subsequently did remove this from what I'm understanding. Nonetheless long story short is what he had rewrapped once he removed it subsequently had maggots underneath this wrap whenever he came in for evaluation today. With that being said they were obviously completely cleaned away by the nursing staff. The visit today which is excellent news. However he does appear to potentially have some infection around the right ankle region where the maggots were located as well. He will likely require anabiotic therapy today. 05/26/19 on evaluation today patient actually appears to be doing much better in regard to his bilateral lower extremities. I feel like the infection is under much better control. With that being said there were maggots noted when the wrap was removed yet again today. Again this could have potentially been left over from previous although at this time there does not appear to be any signs of significant drainage there was obviously on the wrap some drainage as well this contracted gnats or otherwise. Either way I do  not see anything that appears to be doing worse in my pinion and in fact I think his drainage has slowed down quite significantly likely mainly due to the fact to his infection being under better control. 06/02/2019 on evaluation today patient actually appears to be doing well with regard to his bilateral lower extremities there is no signs of active infection at this time which is great news. With that being said he does have several open areas more so on the right than the left but nonetheless these are all significantly better than previously noted. 06/09/2019 on evaluation today patient actually appears to be doing well. His wrap stayed up and he did not cause any problems he had more drainage on the right compared to the left but overall I do not see any major issues  at this time which is great news. 06/16/2019 on evaluation today patient appears to be doing excellent with regard to his lower extremities the only area that is open is a new blister that can have opened as of today on the medial ankle on the left. Other than this he really seems to be doing great I see no major issues at this point. 06/23/2019 on evaluation today patient appears to be doing quite well with regard to his bilateral lower extremities. In fact he actually appears to be almost completely healed there is a small area of weeping noted of the right lower extremity just above the ankle. Nonetheless fortunately there is no signs of active infection at this time which is good news. No fevers, chills, nausea, vomiting, or diarrhea. 8/24; the patient arrived for a nurse visit today but complained of very significant pain in the left leg and therefore I was asked to look at this. Noted that he did not have an open area on the left leg last week nevertheless this was wrapped. The patient states that he is not been able to put his compression pumps on the left leg because of the discomfort. He has not been systemically unwell 06/30/2019  on evaluation today patient unfortunately despite being excellent last week is doing much worse with regard to his left lower extremity today. In fact he had to come in for a nurse on Monday where his left leg had to be rewrapped due to excessive weeping Dr. Dellia Nims placed him on doxycycline at that point. Fortunately there is no signs of active infection Systemically at this time which is good news. 07/07/2019 in regard to the patient's wounds today he actually seems to be doing well with his right lower extremity there really is nothing open or draining at this point this is great news. Unfortunately the left lower extremity is given him additional trouble at this time. There does not appear to be any signs of active infection nonetheless he does have a lot of edema and swelling noted at this point as well as blistering all of which has led to a much more poor appearing leg at this time compared to where it was 2 weeks ago when it was almost completely healed. Obviously this is a little discouraging for the patient. He is try to contact the lymphedema clinic in [place] he has not been able to get through to them. 07/14/2019 on evaluation today patient actually appears to be doing slightly better with regard to his left lower extremity ulcers. Overall I do feel like at least at the top of the wrap that we have been placing this area has healed quite nicely and looks much better. The remainder of the leg is showing signs of improvement. Unfortunately in the thigh area he still has an open region on the left and again on the right he has been utilizing just a Band-Aid on an area that also opened on the thigh. Again this is an area that were not able to wrap although we did do an Ace wrap to provide some compression that something that obviously is a little less effective than the compression wraps we have been using on the lower portion of the leg. He does have an appointment with the lymphedema clinic  in [facility] on Friday. 07/21/2019 on evaluation today patient appears to be doing better with regard to his lower extremity ulcers. He has been tolerating the dressing changes without complication. Fortunately there is no signs of  active infection at this time. No fevers, chills, nausea, vomiting, or diarrhea. I did receive the paperwork from the physical therapist at the lymphedema clinic in [place]. Subsequently I signed off on that this morning and sent that back to him for further progression with the treatment plan. 07/28/2019 on evaluation today patient appears to be doing very well with regard to his right lower extremity where I do not see any open wounds at this point. Fortunately he is feeling great as far as that is concerned as well. In regard to the left lower extremity he has been having issues with still several areas of weeping and edema although the upper leg is doing better his lower leg still I think is going require the compression wrap at this time. No fevers, chills, nausea, vomiting, or diarrhea. 08/04/2019 on evaluation today patient unfortunately is having new wounds on the right lower extremity. Again we have been using Unna boot wrap on that side. We switched him to using his juxta lite wrap at home. With that being said he tells me he has been using it although his legs extremely swollen and to be honest really does not appear that he has been. I cannot know that for sure however. Nonetheless he has multiple new wounds on the right lower extremity at this time. Obviously we will have to see about getting this rewrapped for him today. 08/11/2019 on evaluation today patient appears to be doing fairly well with regard to his wounds. He has been tolerating the dressing changes including the compression wraps without complication. He still has a lot of edema in his upper thigh regions bilaterally he is supposed to be seeing the lymphedema clinic on the 15th of this month  once his wraps arrive for the upper part of his legs. 08/18/2019 on evaluation today patient appears to be doing well with regard to his bilateral lower extremities at this point. He has been tolerating the dressing changes without complication. Fortunately there is no signs of active infection which is also good news. He does have a couple weeping areas on the first and second toe of the right foot he also has just a small area on the left foot upper leg and a small area on the left lower leg but overall he is doing quite well in my opinion. He is supposed to be getting his wraps shortly in fact tomorrow and then subsequently is seeing the lymphedema clinic next Wednesday on the 21st. Of note he is also leaving on the 25th to go on vacation for a week to the beach. For that reason and since there is some uncertainty about what there can be doing at lymphedema clinic next Wednesday I am get a make an appointment for next Friday here for [place] to see what we need to do for him prior to him leaving for vacation. 10/23; patient arrives in considerable pain predominantly in the upper posterior calf just distal to the popliteal fossa also in the wound anteriorly above the major wound. This is probably cellulitis and he has had this recurrently in the past. He has no open wound on the right side and he has had an Haematologist in that area. Finally I note that he has an area on the left posterior calf which by enlarge is mostly epithelialized. This protrudes beyond the borders of the surrounding skin in the setting of dry scaly skin and lymphedema. The patient is leaving for [facility] on Sunday. Per his longstanding pattern,  he will not take his compression pumps with him predominantly out of fear that they will be stolen. He therefore asked that we put a Unna boot back on the right leg. He will also contact the wound care center in [facility] to see if they can change his dressing in the mid week. 11/3;  patient returned from his vacation to [facility]. He was seen on 1 occasion at their wound care center. They did a 2 layer compression system as they did not have our 4-layer wrap. I am not completely certain what they put on the wounds. They did not change the Unna boot on the right. The patient is also seeing a lymphedema specialist physical therapist in [place]. It appears that he has some compression sleeve for his thighs which indeed look quite a bit better than I am used to seeing. He pumps over these with his external compression pumps. 11/10; the patient has a new wound on the right medial thigh otherwise there is no open areas on the right. He has an area on the left leg posteriorly anteriorly and medially and an area over the left second toe. We have been using silver alginate. He thinks the injury on his thigh is secondary to friction from the compression sleeve he has. 11/17; the patient has a new wound on the right medial thigh last week. He thinks this is because he did not have a underlying stocking for his thigh juxta lite apparatus. He now has this. The area is fairly large and somewhat angry but I do not think he has underlying cellulitis. He has a intact blister on the right anterior tibial area. Small wound on the right great toe dorsally Small area on the medial left calf. 11/30; the patient does not have any open areas on his right leg and we did not take his juxta lite stocking off. However he states that on Friday his compression wrap fell down lodging around his upper mid calf area. As usual this creates a lot of problems for him. He called urgently today to be seen for a nurse visit however the nurse visit turned into a provider visit because of extreme erythema and pain in the left anterior tibia extending laterally and posteriorly. The area that is problematic is extensive 10/06/2019 upon evaluation today patient actually appears to be doing poorly in regard to his  left lower extremity. He Dr. Dellia Nims did place him on doxycycline this past Monday apparently due to the fact that he was doing much worse in regard to this left leg. Fortunately the doxycycline does seem to be helping. Unfortunately we are still having a very difficult time getting his edema under any type of control in order to anticipate discharge at some point. The only way were really able to control his lymphedema really is with compression wraps and that has only even seemingly temporary. He has been seeing a lymphedema clinic they are trying to help in this regard but still this has been somewhat frustrating in general for the patient. 10/13/19 on evaluation today patient appears to be doing excellent with regard to his right lower extremity as far as the wounds are concerned. His swelling is still quite extensive unfortunately. He is still having a lot of drainage from the thigh areas bilaterally which is unfortunate. He's been going to lymphedema clinic but again he still really does not have this edema under control as far as his lower extremities are concern. With regard to his left  lower extremity this seems to be improving and I do believe the doxycycline has been of benefit for him. He is about to complete the doxycycline. 10/20/2019 on evaluation today patient appears to be doing poorly in regard to his bilateral lower extremities. More in the right thigh he has a lot of irritation at this site unfortunately. In regard to the left lower extremity the wrap was not quite as high it appears and does seem to have caused him some trouble as well. Fortunately there is no evidence of systemic infection though he does have some blue-green drainage which has me concerned for the possibility of Pseudomonas. He tells me he is previously taking Cipro without complications and he really does not care for Levaquin however due to some of the side effects he has. He is not allergic to any medications  specifically antibiotics that were aware of. 10/27/2019 on evaluation today patient actually does appear to be for the most part doing better when compared to last week's evaluation. With that being said he still has multiple open wounds over the bilateral lower extremities. He actually forgot to start taking the Cipro and states that he still has the whole bottle. He does have several new blisters on left lower extremity today I think I would recommend he go ahead and take the Cipro based on what I am seeing at this point. 12/30-Patient comes at 1 week visit, 4 layer compression wraps on the left and Unna boot on the right, primary dressing Xtrasorb and silver alginate. Patient is taking his Cipro and has a few more days left probably 5-6, and the legs are doing better. He states he is using his compressions devices which I believe he has 11/10/2019 on evaluation today patient actually appears to be much better than last time I saw him 2 weeks ago. His wounds are significantly improved and overall I am very pleased in this regard. Fortunately there is no signs of active infection at this time. He is just a couple of days away from completing Cipro. Overall his edema is much better he has been using his lymphedema pumps which I think is also helping at this point. 11/17/2019 on evaluation today patient appears to be doing excellent in regard to his wounds in general. His legs are swollen but not nearly as much as they have been in the past. Fortunately he is tolerating the compression wraps without complication. No fevers, chills, nausea, vomiting, or diarrhea. He does have some erythema however in the distal portion of his right lower extremity specifically around the forefoot and toes there is a little bit of warmth here as well. 11/24/2019 on evaluation today patient appears to be doing well with regard to his right lower extremity I really do not see any open wounds at this point. His left lower  extremity does have several open areas and his right medial thigh also is open. Other than this however overall the patient seems to be making good progress and I am very pleased at this point. 12/01/2019 on evaluation today patient appears to be doing poorly at this point in regard to his left lower extremity has several new blisters despite the fact that we have him in compression wraps. In fact he had a 4-layer compression wrap, his upper thigh wrapped from lymphedema clinic, and a juxta light over top of the 4 layer compression wrap the lymphedema clinic applied and despite all this he still develop blisters underneath. Obviously this does have me concerned  about the fact that unfortunately despite what we are doing to try to get wounds healed he continues to have new areas arise I do not think he is ever good to be at the point where he can realistically just use wraps at home to keep things under control. Typically when we heal him it takes about 1-2 days before he is back in the clinic with severe breakdown and blistering of his lower extremities bilaterally. This is happened numerous times in the past. Unfortunately I think that we may need some help as far as overall fluid overload to kind of limit what we are seeing and get things under better control. 12/08/2019 on evaluation today patient presents for follow-up concerning his ongoing bilateral lower extremity edema. Unfortunately he is still having quite a bit of swelling the compression wraps are controlling this to some degree but he did see Dr. Debara Pickett his cardiologist I do have that available for review today as far as the appointment was concerned that was on 12/06/2019. Obviously that she has been 2 days ago. The patient states that he is only been taking the Lasix 80 mg 1 time a day he had told me previously he was taking this twice a day. Nonetheless Dr. Debara Pickett recommended this be up to 80 mg 2 times a day for the patient as he did appear to  be fluid overloaded. With that being said the patient states he did this yesterday and he was unable to go anywhere or do anything due to the fact that he was constantly having to urinate. Nonetheless I think that this is still good to be something that is important for him as far as trying to get his edema under control at all things that he is going to be able to just expect his wounds to get under control and things to be better without going through at least a period of time where he is trying to stabilize his fluid management in general and I think increasing the Lasix is likely the first step here. It was also mentioned the possibility that the patient may require metolazone. With that being said he wanted to have the patient take Lasix twice a day first and then reevaluating 2 months to see where things stand. 12/15/2019 upon evaluation today patient appears to be doing regard to his legs although his toes are showing some signs of weeping especially on the left at this point to some degree on the right. There does not appear to be any signs of active infection and overall I do feel like the compression wraps are doing well for him but he has not been able to take the Lasix at home and the increased dose that Dr. Debara Pickett recommended. He tells me that just not go to be feasible for him. Nonetheless I think in this case he should probably send a message to Dr. Debara Pickett in order to discuss options from the standpoint of possible admission to get the fluid off or otherwise going forward. 12/22/2019 upon evaluation today patient appears to be doing fairly well with regard to his lower extremities at this point. In fact he would be doing excellent if it was not for the fact that his right anterior thigh apparently had an allergic reaction to adhesive tape that he used. The wound itself that we have been monitoring actually appears to be healed. There is a lot of irritation at this point. 12/29/2019 upon  evaluation today patient appears to be doing well  in regard to his lower extremities. His left medial thigh is open and somewhat draining today but this is the only region that is open the right has done much better with the treatment utilizing the steroid cream that I prescribed for him last week. Overall I am pleased in that regard. Fortunately there is no signs of active infection at this time. No fevers, chills, nausea, vomiting, or diarrhea. 01/05/2020 upon evaluation today patient appears to be doing more poorly in regard to his right lower extremity at this point upon evaluation today. Unfortunately he continues to have issues in this regard and I think the biggest issue is controlling his edema. This obviously is not very well controlled at this point is been recommended that he use the Lasix twice a day but he has not been able to do that. Unfortunately I think this is leading to an issue where honestly he is not really able to effectively control his edema and therefore the wounds really are not doing significantly better. I do not think that he is going to be able to keep things under good control unless he is able to control his edema much better. I discussed this again in great detail with him today. 01/12/2020 good news is patient actually appears to be doing quite well today at this point. He does have an appointment with lymphedema clinic tomorrow. His legs appear healed and the toe on the left is almost completely healed. In general I am very pleased with how things stand at this point. 01/19/2020 upon evaluation today patient appears to actually be doing well in regard to his lower extremities there is nothing open at this point. Fortunately he has done extremely well more recently. Has been seeing lymphedema clinic as well. With that being said he has Velcro wraps for his lower legs as well as his upper legs. The only wound really is on his toe which is the right great toe and this is  barely anything even there. With all that being said I think it is good to be appropriate today to go ahead and switch him over to the Velcro compression wraps. 01/26/2020 upon evaluation today patient appears to be doing worse with regard to his lower extremities after last week switch him to Velcro compression wraps. Unfortunately he lasted less than 24 hours he did not have the sock portion of his Velcro wrap on the left leg and subsequently developed a blister underneath the Velcro portion. Obviously this is not good and not what we were looking for at this point. He states the lymphedema clinic did tell him to wear the wrap for 23 hours and take him off for 1 I am okay with that plan but again right now we got a get things back under control again he may have some cellulitis noted as well. 02/02/2020 upon evaluation today patient unfortunately appears to have several areas of blistering on his bilateral lower extremities today mainly on the feet. His legs do seem to be doing somewhat better which is good news. Fortunately there is no evidence of active infection at this time. No fevers, chills, nausea, vomiting, or diarrhea. 02/16/2020 upon evaluation today patient appears to be doing well at this time with regard to his legs. He has a couple weeping areas on his toes but for the most part everything is doing better and does appear to be sealed up on his legs which is excellent news. We can continue with wrapping him at this point  as he had every time we discontinue the wraps he just breaks out with new wounds. There is really no point in is going forward with this at this point. 03/08/2020 upon evaluation today patient actually appears to be doing quite well with regard to his lower extremity ulcers. He has just a very superficial and really almost nonexistent blister on the left lower extremity he has in general done very well with the compression wraps. With that being said I do not see any signs  of infection at this time which is good news. 03/29/2020 upon evaluation today patient appears to be doing well with regard to his wounds currently except for where he had several new areas that opened up due to some of the wrap slipping and causing him trouble. He states he did not realize they had slipped. Nonetheless he has a 1 area on the right and 3 new areas on the left. Fortunately there is no signs of active infection at this time which is great news. 04/05/2020 upon evaluation today patient actually appears to be doing quite well in general in regard to his legs currently. Fortunately there is no signs of active infection at this time. No fevers, chills, nausea, vomiting, or diarrhea. He tells me next week that he will actually be seen in the lymphedema clinic on Thursday at 10 AM I see him on Wednesday next week. 04/12/2020 upon evaluation today patient appears to be doing very well with regard to his lower extremities bilaterally. In fact he does not appear to have any open wounds at this point which is good news. Fortunately there is no signs of active infection at this time. No fevers, chills, nausea, vomiting, or diarrhea. 04/19/2020 upon evaluation today patient appears to be doing well with regard to his wounds currently on the bilateral lower extremities. There does not appear to be any signs of active infection at this time. Fortunately there is no evidence of systemic infection and overall very pleased at this point. Nonetheless after I held him out last week he literally had blisters the next morning already which swelled up with him being right back here in the clinic. Overall I think that he is just not can be able to be discharged with his legs the way they are he is much to volume overloaded as far as fluid is concerned and that was discussed with him today of also discussed this but should try the clinic nurse manager as well as Dr. Dellia Nims. 04/26/2020 upon evaluation today patient  appears to be doing better with regard to his wounds currently. He is making some progress and overall swelling is under good control with the compression wraps. Fortunately there is no evidence of active infection at this time. 05/10/2020 on evaluation today patient appears to be doing overall well in regard to his lower extremities bilaterally. He is Tolerating the compression wraps without complication and with what we are seeing currently I feel like that he is making excellent progress. There is no signs of active infection at this time. 05/24/2020 upon evaluation today patient appears to be doing well in regard to his legs. The swelling is actually quite a bit down compared to where it has been in the past. Fortunately there is no sign of active infection at this time which is also good news. With that being said he does have several wounds on his toes that have opened up at this point. 05/31/2020 upon evaluation today patient appears to be doing well  with regard to his legs bilaterally where he really has no significant fluid buildup at this point overall he seems to be doing quite well. Very pleased in this regard. With regard to his toes these also seem to be drying up which is excellent. We have continue to wrap him as every time we tried as a transition to the juxta light wraps things just do not seem to get any better. 06/07/2020 upon evaluation today patient appears to be doing well with regard to his right leg at this point. Unfortunately left leg has a lot of blistering he tells me the wrap started to slide down on him when he tried to put his other Velcro wrap over top of it to help keep things in order but nonetheless still had some issues. 06/14/2020 on evaluation today patient appears to be doing well with regard to his lower extremity ulcers and foot ulcers at this point. I feel like everything is actually showing signs of improvement which is great news overall there is no signs of active  infection at this time. No fevers, chills, nausea, vomiting, or diarrhea. 06/21/2020 on evaluation today patient actually appears to be doing okay in regard to his wounds in general. With that being said the biggest issue I see is on his right foot in particular the first and second toe seem to be doing a little worse due to the fact this is staying very wet. I think he is probably getting need to change out his dressings a couple times in between each week when we see him in regard to his toes in order to keep this drier based on the location and how this is proceeding. 06/28/2020 on evaluation today patient appears to be doing a little bit more poorly overall in regard to the appearance of the skin I am actually somewhat concerned about the possibility of him having a little bit of an infection here. We discussed the course of potentially giving him a doxycycline prescription which he is taken previously with good result. With that being said I do believe that this is potentially mild and at this point easily fixed. I just do not want anything to get any worse. 07/12/2020 upon evaluation today patient actually appears to be making some progress with regard to his legs which is great news there does not appear to be any evidence of active infection. Overall very pleased with where things stand. 07/26/2020 upon evaluation today patient appears to be doing well with regard to his leg ulcers and toe ulcers at this point. He has been tolerating the compression wraps without complication overall very pleased in this regard. 08/09/2020 upon evaluation today patient appears to be doing well with regard to his lower extremities bilaterally. Fortunately there is no signs of active infection overall I am pleased with where things stand. 08/23/2020 on evaluation today patient appears to be doing well with regard to his wound. He has been tolerating the dressing changes without complication. Fortunately there is no  signs of active infection at this time. Overall his legs seem to be doing quite well which is great news and I am very pleased in that regard. No fevers, chills, nausea, vomiting, or diarrhea. 09/13/2020 upon evaluation today patient appears to be doing okay in regard to his lower extremities. He does have a fairly large blister on the right leg which I did remove the blister tissue from today so we can get this to dry out other than that however he  seems to be doing quite well. There is no signs of active infection at this time. 09/27/2020 upon evaluation today patient appears to actually be doing some better in regard to his right leg. Fortunately signs of active infection at this time which is great news. No fevers, chills, nausea, vomiting, or diarrhea. 10/04/2020 upon evaluation today patient actually appears to be showing signs of improvement which is great news with regard to his leg ulcers. Fortunately there is no signs of active infection which is great news he is still taking the antibiotics currently. No fevers, chills, nausea, vomiting, or diarrhea. 10/18/2020 on evaluation today patient appears to be doing well with regard to his legs currently. He has been tolerating the dressing changes including the wraps without complication. Fortunately there is no signs of active infection at this time. No fevers, chills, nausea, vomiting, or diarrhea. 10/25/2020 upon evaluation today patient appears to be doing decently well in regard to his wounds currently. He has been tolerating the dressing changes without complication. Overall I feel like he is making good progress albeit slow. Again this is something we can have to continue to wrap for some time to come most likely. 11/08/2020 upon evaluation today patient appears to be doing well with regard to his wounds currently. He has been tolerating the dressing changes without complication is not currently on any antibiotics and he does not appear to  show any signs of infection. He does continue to have a lot of drainage on the right leg not too severe but nonetheless this is very scattered. On the left leg this is looking to be much improved overall. 11/15/2020 upon evaluation today patient appears to be doing better with regard to his legs bilaterally. Especially the right leg which was much more significant last week. There does not appear to be any signs of active infection which is great news. No fevers, chills, nausea, vomiting, or diarrhea. 11/23/2019 upon evaluation today patient appears to be doing poorly still in regard to his lower extremities bilaterally. Unfortunately his right leg in particular appears to be doing much more poorly there is no signs really of infection this is not warm to touch but he does have a lot of drainage and weeping unfortunately. With that reason I do believe that we may need to initiate some treatment here to try to help calm down some of the swelling of the right leg. I think switching to a 4-layer compression wrap would be beneficial here. The patient is in agreement with giving this a try. 11/29/2020 upon evaluation today patient appears to be doing well currently in regard to his leg ulcers. I feel like the right leg is doing better he still has a lot of drainage but we do see some improvement here. The 4-layer compression wrap I think was helpful. 12/06/2020 upon evaluation today patient appears to be doing well with regard to his legs. In fact they seem to be doing about the best I have seen up to this point. Fortunately there is no signs of active infection at this time. No fevers, chills, nausea, vomiting, or diarrhea. 12/20/2020 upon evaluation today patient appears to be doing well at this time in regard to his legs. He is not having any significant draining which is great news. Fortunately there is no signs of active infection at this time. No fevers, chills, nausea, vomiting, or diarrhea. 01/17/2021 upon  evaluation today Traden actually appears to be doing excellent in regard to his legs. He has a  few areas again that come and go as far as his toes are concerned but overall this is doing quite well. 01/31/2021 upon evaluation today patient appears to be doing well with regard to his legs. Fortunately there does not appear to be any signs of active infection which is great news. Overall he is still having significant edema despite the compression wraps basically the 4-layer compression wrap to just keep things under control there is really not much room for play. 4/13: [name] is a longstanding patient in our clinic and benefits greatly from weekly compression wraps. Today he has no complaints. He has been tolerating the wraps well. He states he is using the lymphedema pumps at home. 5/4; patient presents for follow-up of his chronic lymphedema/venous insufficiency ulcers. He comes weekly for compression wraps. He has no complaints today. He was unable to tolerate the Coflex 2 layer Last week so we will do the four press 4-layer compression. He has been using his lymphedema pumps daily. 5/18; patient presents for 2-week follow-up. He has no complaints or issues today. He has developed a new wound to the right foot on his fourth toe. He overall feels well and denies signs of infection. 6/1; patient presents for 2-week follow-up. He has no complaints or issues today. He denies signs of infection. 04/18/2021 upon evaluation today patient appears to be doing well with regard to his legs bilaterally. Family open wound is actually on the toe of his left foot everything else is completely closed which is great news. In general I am extremely pleased with where things stand at this point. The patient is also happy that things are doing so well. 05/02/2021 upon evaluation today patient's legs actually appear to be doing quite well today. Fortunately there does not appear to be any signs of active infection  which is great and overall I am extremely pleased with where he stands today. The patient does not appear to have any evidence of active infection at this time which is also great news. 05/09/2021 upon evaluation today patient appears to be doing a little bit more poorly in regard to his legs. Unfortunately he is having issues with some breakdown and a blood blister on the left leg this is due to I believe honestly to how it was wrapped last week. Fortunately there does not appear to be any signs of infection but nonetheless this is still a concern to be honest. No fevers, chills, nausea, vomiting, or diarrhea. 05/16/2021 upon evaluation today patient appears to be doing significantly better as compared to last week. I am very pleased with where things stand today. There does not appear to be any signs of infection which is great news and overall very pleased with where we stand. No fevers, chills, nausea, vomiting, or diarrhea. 05/30/2021 upon evaluation today patient appears to be doing well with regard to his legs. He has been tolerating the dressing changes without complication. Fortunately there does not appear to be any signs of active infection which is great news and overall I am extremely pleased with where things stand today. No fevers, chills, nausea, vomiting, or diarrhea. 06/20/2021 upon evaluation today patient actually appears to be making good progress today and very pleased with what we are seeing. I think his legs are really maintaining. As long as we continue wrapping he seems to be doing excellent in my opinion. Fortunately there is no signs of active infection at this time. No fevers, chills, nausea, vomiting, or diarrhea. 07/11/2021 upon evaluation  today patient actually appears to be making excellent progress at this time. Fortunately there does not appear to be any evidence of active infection which is great news and overall I am extremely pleased with where things stand today. No  fevers, chills, nausea, vomiting, or diarrhea. 07/25/2021 upon evaluation today patient appears to be doing well currently in regard to his lower extremities. He has been making good progress here and I do not see anything that is actually open significantly today this is great news. No fevers, chills, nausea, vomiting, or diarrhea. 08/08/2021 upon evaluation today patient appears to be doing well with regard to his wound. He has been tolerating the dressing changes without complication. With that being said unfortunately has a new area that opened up as far as his right posterior leg is concerned this was a blister he also has an area on the third toe right foot which also reopen. Fortunately there is no signs of active infection at this time which is great news. No fevers, chills, nausea, vomiting, or diarrhea. 10/17; patient came in today at his request initially for a nurse visit because it but out of concern for deterioration in both his lower legs and cellulitis I was asked to look at him. He comes in with increased swelling which he says started over the weekend he started to notice pain as well in his left medial ankle, right knee, left knee left dorsal foot. His wraps fell down contributing to some of this but he has not been using his compression pumps over the weekend for reasons that are not really clear. He comes in with multiple new wounds including the right posterior leg, right third toe, right fourth toe, left second toe left medial ankle left dorsal ankle and right anterior lower leg 09/19/2021 upon evaluation today patient does seem to be making improvements in general which is great news. I do not see any evidence of infection currently he does have some hypergranulation of the anterior portion of his ankle on the left side this is going require some debridement to pare this down and then subsequently silver nitrate probably due to the amount of bleeding that he is probably going to  experience. He is in agreement with this plan however. 09/26/2021 upon evaluation today although the patient's legs appear to be doing okay today unfortunately he did have maggots noted during the evaluation as well. This is again quite unfortunate with the respect to the fact that he feels like that he got the wrap wet which was noted today he is not sure when and I think this is what led to the issue. Nonetheless we can try to see what we can do about silly things off so that this does not happen again in the future although he is can have to be diligent about taking care of his wraps as well. 10/03/2021 upon evaluation today patient appears to be doing okay in regard to his legs currently. He unfortunately has maggots again noted on the left foot. This obviously is becoming quite an issue to be perfectly honest. I do think that he needs to see what can be done at home to try to limit this exposure. Last week we had cleaned everything away so this is a new infestation as far as that is concerned. There is really not much that I can do to combat that I am trying to do what we can here in the clinic to wrap his foot and try to prevent any  access of that again with knots there is a small this becomes very difficult. 10/10/2021 upon evaluation today patient appears to be doing poorly in regard to his legs he is very swollen and unfortunately I think a big part of the issue here is simply that he is not taking his fluid pills appropriately stressed probably is not helping much at all either. He still having a lot of issues as far as getting moved and having to be out of his current living condition. With that being said he does have of note maggots noted on the right foot this time completely separate from what we have been seeing he tells me that he got his wraps wet again. Its mainly when it rains it sounds like that he goes out to his car in the area where he has to park there is a area that collects a  fairly large puddle according to what he tells me today either way I explained to him which we have done before as well that if he gets his wraps wet he needs to let us know so we can get them changed out. He does not need to keep them on wet. 10/17/2021 upon evaluation today patient appears to be doing well in regard to his legs. In fact things are significantly better compared to where they were previous. Fortunately I see no evidence of infection currently which is great news. No fevers, chills, nausea, vomiting, or diarrhea. 10/24/2021 upon evaluation today patient appears to be doing awesome in regard to his legs in general. I think everything is showing signs of significant improvement which is great news. There is really only 1 opening on the second toe left foot everything else is closed. 11/07/2020 upon evaluation today patient appears to be doing well with regard to his legs in general he does have a blister on the left foot but otherwise things are doing overall quite well. Electronic Signature(s) Signed: 11/07/2021 1:03:27 PM By: Worthy Keeler PA-C Entered By: Worthy Keeler on 11/07/2021 13:03:27 -------------------------------------------------------------------------------- Physical Exam Details Patient Name: Date of Service: [name], [name] 11/07/2021 10:30 A M Medical Record Number: [id number] Patient Account Number: [id number] Date of Birth/Sex: Treating RN: [birth year] (71 y.o. [name] Primary Care Provider: [name] Other Clinician: Referring Provider: Treating Provider/Extender: [name] in Treatment: 90 Constitutional Well-nourished and well-hydrated in no acute distress. Respiratory normal breathing without difficulty. Psychiatric this patient is able to make decisions and demonstrates good insight into disease process. Alert and Oriented x 3. pleasant and cooperative. Notes Upon inspection patient's wound bed actually  showed signs of good granulation and epithelization at this point. Fortunately there does not appear to be any evidence of active infection locally nor systemically at this time. Electronic Signature(s) Signed: 11/07/2021 1:03:59 PM By: Worthy Keeler PA-C Entered By: Worthy Keeler on 11/07/2021 13:03:59 -------------------------------------------------------------------------------- Physician Orders Details Patient Name: Date of Service: [name], [name] 11/07/2021 10:30 A M Medical Record Number: [id number] Patient Account Number: [id number] Date of Birth/Sex: Treating RN: [birth year] (71 y.o. [name], [name] Primary Care Provider: [name] Other Clinician: Referring Provider: Treating Provider/Extender: [name] in Treatment: [phone] Verbal / Phone Orders: No Diagnosis Coding ICD-10 Coding Code Description L97.521 Non-pressure chronic ulcer of other 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 of skin L97.829 Non-pressure chronic ulcer of other part of left lower leg with unspecified severity L97.519 Non-pressure chronic ulcer of other part of  right foot with unspecified severity L97.812 Non-pressure chronic ulcer of other part of right lower leg with fat layer exposed I87.333 Chronic venous hypertension (idiopathic) with ulcer and inflammation of bilateral lower extremity L03.116 Cellulitis of left lower limb I89.0 Lymphedema, not elsewhere classified E11.40 Type 2 diabetes mellitus with diabetic neuropathy, unspecified E11.622 Type 2 diabetes mellitus with other skin ulcer L03.115 Cellulitis of right lower limb Follow-up Appointments ppointment in 2 weeks. Margarita Grizzle Wednesday Return A Other: - next week Wednesday Bathing/ Shower/ Hygiene May shower with protection but do not get wound dressing(s) wet. Edema Control - Lymphedema / SCD / Other Bilateral Lower Extremities Lymphedema Pumps. Use Lymphedema pumps on leg(s) 2-3 times a day for 45-60 minutes.  If wearing any wraps or hose, do not remove them. Continue exercising as instructed. Elevate legs to the level of the heart or above for 30 minutes daily and/or when sitting, a frequency of: - throughout the day Avoid standing for long periods of time. Exercise regularly Other Edema Control Orders/Instructions: - right leg 4 layer compression with unna boot to secure to upper portion of lower leg. apply lotion to right leg. Wound Treatment Wound #204 - T Second oe Wound Laterality: Left Prim Dressing: KerraCel Ag Gelling Fiber Dressing, 2x2 in (silver alginate) ary Discharge Instructions: Apply silver alginate to wound bed as instructed Secondary Dressing: Woven Gauze Sponges 2x2 in Discharge Instructions: Apply over primary dressing as directed. Secured With: Coban Self-Adherent Wrap 4x5 (in/yd) Discharge Instructions: Secure with Coban as directed. Wound #210 - Foot Wound Laterality: Dorsal, Left Cleanser: Soap and Water 1 x Per Week Discharge Instructions: May shower and wash wound with dial antibacterial soap and water prior to dressing change. Cleanser: Wound Cleanser 1 x Per Week Discharge Instructions: Cleanse the wound with wound cleanser prior to applying a clean dressing using gauze sponges, not tissue or cotton balls. Peri-Wound Care: Zinc Oxide Ointment 30g tube 1 x Per Week Discharge Instructions: Apply Zinc Oxide to periwound with each dressing change Peri-Wound Care: Sween Lotion (Moisturizing lotion) 1 x Per Week Discharge Instructions: Apply moisturizing lotion as directed Prim Dressing: KerraCel Ag Gelling Fiber Dressing, 4x5 in (silver alginate) 1 x Per Week ary Discharge Instructions: Apply silver alginate to wound bed as instructed Secondary Dressing: ABD Pad, 8x10 1 x Per Week Discharge Instructions: Apply over primary dressing as directed. Compression Wrap: FourPress (4 layer compression wrap) 1 x Per Week Discharge Instructions: Apply four layer compression  apply unna boot first layer to upper portion of lower leg. Electronic Signature(s) Signed: 11/07/2021 4:51:59 PM By: Worthy Keeler PA-C Signed: 11/07/2021 5:44:25 PM By: Deon Pilling RN, BSN Entered By: Deon Pilling on 11/07/2021 11:33:19 -------------------------------------------------------------------------------- Problem List Details Patient Name: Date of Service: [name], [name] 11/07/2021 10:30 A M Medical Record Number: [id number] Patient Account Number: [id number] Date of Birth/Sex: Treating RN: [birth year] (71 y.o. 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 of 04/27/2018 No Yes skin L97.829 Non-pressure chronic ulcer of other part of left lower leg with unspecified 03/21/2021 No Yes severity L97.519 Non-pressure chronic ulcer of other part of right foot with unspecified severity 03/21/2021 No Yes L97.812 Non-pressure chronic ulcer of other part of right lower leg with fat layer 08/08/2021 No Yes exposed I87.333 Chronic venous hypertension (idiopathic) with  ulcer and inflammation of 01/22/2016 No Yes bilateral lower extremity L03.116 Cellulitis of left lower limb 08/20/2021 No Yes I89.0 Lymphedema, not elsewhere classified 01/22/2016 No Yes E11.40 Type 2 diabetes mellitus with diabetic neuropathy, unspecified 01/22/2016 No Yes E11.622 Type 2 diabetes mellitus with other skin ulcer 01/22/2016 No Yes L03.115 Cellulitis of right lower limb 12/22/2017 No Yes Inactive Problems ICD-10 Code Description Active Date Inactive Date 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 06/30/2018 06/30/2018 L97.511 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  to breakdown of skin 06/30/2018 06/30/2018 Resolved Problems ICD-10 Code Description Active Date Resolved Date L97.211 Non-pressure chronic ulcer of right calf limited to breakdown of skin 06/30/2018 06/30/2018 L97.221 Non-pressure chronic ulcer of left calf limited to breakdown of skin 09/30/2016 09/30/2016 L03.116 Cellulitis of left lower limb 04/01/2017 04/01/2017 L97.211 Non-pressure chronic ulcer of right calf limited to breakdown of skin 06/30/2017 06/30/2017 Electronic Signature(s) Signed: 11/07/2021 10:53:23 AM By: Worthy Keeler PA-C Entered By: Worthy Keeler on 11/07/2021 10:53:22 -------------------------------------------------------------------------------- Progress Note Details Patient Name: Date of Service: [name], [name] 11/07/2021 10:30 A M Medical Record Number: [id number] Patient Account Number: [id number] Date of Birth/Sex: Treating RN: 1951-06-27 (71 y.o. [name] Primary Care Provider: [name] Other Clinician: Referring Provider: Treating Provider/Extender: [name] in Treatment: [place] obtained from Patient Patient is here for evaluation of venous/lymphedema ulcers History of Present Illness (HPI) Referred by PCP for consultation. Patient has long standing history of BLE venous stasis, no prior ulcerations. At beginning of month, developed cellulitis and weeping. Received IM Rocephin followed by Keflex and resolved. Wears compression stocking, appr 6 months old. Not sure strength. No present drainage. 01/22/16 this is a patient who is a type II diabetic on insulin. 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 He tells me he religiously wears pressure stockings of uncertain strength. He was here with weeping edema about 8 months ago but did not have an open wound. Roughly a month ago he had a reopening on his bilateral legs. He is been using bandages and  Neosporin. He does not complain of pain. 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 Last BUN/creatinine I have is from 11/20/15 at 13 and 1.0 respectively 01/29/16; patient arrives today having tolerated the Profore wrap. He brought in his stockings and these are 18 mmHg stockings he bought from [place]. The compression here is likely inadequate. He does not complain of pain or excessive drainage she has no systemic symptoms. The wound on the right looks improved as does the one on the left although one on the left is more substantial with still tissue at risk below the actual wound area on the bilateral posterior calf 02/05/16; patient arrives with poor edema control. He states that we did put a 4 layer compression on it last week. No weight appear 5 this. 02/12/16; the area on the posterior right Has healed. The left Has a substantial wound that has necrotic surface eschar that requires a debridement with a curette. 02/16/16;the patient called or a Nurse visit secondary to increased swelling. He had been in earlier in the week with his right leg healed. He was transitioned to is on pressure stocking on the right leg with the only open wound on the left, a substantial area on the left  posterior calf. Note he has a history of severe lower extremity edema, he has a history of chronic atrial fibrillation but not heart failure per my notes but I'll need to research this. He is not complaining of chest pain shortness of breath or orthopnea. The intake nurse noted blisters on the previously closed right leg 02/19/16; this is the patient's regular visit day. I see him on Friday with escalating edema new wounds on the right leg and clear signs of at least right ventricular heart failure. I increased his Lasix to 40 twice a day. He is returning currently in follow-up. States he is noticed a decrease in that  the edema 02/26/16 patient's legs have much less edema. There is nothing really open on the right leg. The left leg has improved condition of the large superficial wound on the posterior left leg 03/04/16; edema control is very much better. The patient's right leg wounds have healed. On the left leg he continues to have severe venous inflammation on the posterior aspect of the left leg. There is no tenderness and I don't think any of this is cellulitis. 03/11/16; patient's right leg is married healed and he is in his own stocking. The patient's left leg has deteriorated somewhat. There is a lot of erythema around the wound on the posterior left leg. There is also a significant rim of erythema posteriorly just above where the wrap would've ended there is a new wound in this location and a lot of tenderness. Can't rule out cellulitis in this area. 03/15/16; patient's right leg remains healed and he is in his own stocking. The patient's left leg is much better than last review. His major wound on the posterior aspect of his left Is almost fully epithelialized. He has 3 small injuries from the wraps. Really. Erythema seems a lot better on antibiotics 03/18/16; right leg remains healed and he is in his own stocking. The patient's left leg is much better. The area on the posterior aspect of the left calf is fully epithelialized. His 3 small injuries which were wrap injuries on the left are improved only one seems still open his erythema has resolved 03/25/16; patient's right leg remains healed and he is in his own stocking. There is no open area today on the left leg posterior leg is completely closed up. His wrap injuries at the superior aspect of his leg are also resolved. He looks as though he has some irritation on the dorsal ankle but this is fully epithelialized without evidence of infection. 03/28/16; we discharged this patient on Monday. Transitioned him into his own stocking. There were problems almost  immediately with uncontrolled swelling weeping edema multiple some of which have opened. He does not feel systemically unwell in particular no chest pain no shortness of breath and he does not feel 04/08/16; the edema is under better control with the Profore light wrap but he still has pitting edema. There is one large wound anteriorly 2 on the medial aspect of his left leg and 3 small areas on the superior posterior calf. Drainage is not excessive he is tolerating a Profore light well 04/15/16; put a Profore wrap on him last week. This is controlled is edema however he had a lot of pain on his left anterior foot most of his wounds are healed 04/22/16 once again the patient has denuded areas on the left anterior foot which he states are because his wrap slips up word. He saw his primary physician today is on Lasix  40 twice a day and states that he his weight is down 20 pounds over the last 3 months. 04/29/16: Much improved. left anterior foot much improved. He is now on Lasix 80 mg per day. Much improved edema control 05/06/16; I was hoping to be able to discharge him today however once again he has blisters at a low level of where the compression was placed last week mostly on his left lateral but also his left medial leg and a small area on the anterior part of the left foot. 05/09/16; apparently the patient went home after his appointment on 7/4 later in the evening developing pain in his upper medial thigh together with subjective fever and chills although his temperature was not taken. The pain was so intense he felt he would probably have to call 911. However he then remembered that he had leftover doxycycline from a previous round of antibiotics and took these. By the next morning he felt a lot better. He called and spoke to one of our nurses and I approved doxycycline over the phone thinking that this was in relation to the wounds we had previously seen although they were definitely were not. The  patient feels a lot better old fever no chills he is still working. Blood sugars are reasonably controlled 05/13/16; patient is back in for review of his cellulitis on his anterior medial upper thigh. He is taking doxycycline this is a lot better. Culture I did of the nodular area on the dorsal aspect of his foot grew MRSA this also looks a lot better. 05/20/16; the patient is cellulitis on the medial upper thigh has resolved. All of his wound areas including the left anterior foot, areas on the medial aspect of the left calf and the lateral aspect of the calf at all resolved. He has a new blister on the left dorsal foot at the level of the fourth toe this was excised. No evidence of infection 05/27/16; patient continues to complain weeping edema. He has new blisterlike wounds on the left anterior lateral and posterior lateral calf at the top of his wrap levels. The area on his left anterior foot appears better. He is not complaining of fever, pain or pruritus in his feet. 05/30/16; the patient's blisters on his left anterior leg posterior calf all look improved. He did not increase the Lasix 100 mg as I suggested because he was going to run out of his 40 mg tablets. He is still having weeping edema of his toes 06/03/16; I renewed his Lasix at 80 mg once a day as he was about to run out when I last saw him. He is on 80 mg of Lasix now. I have asked him to cut down on the excessive amount of water he was drinking and asked him to drink according to his thirst mechanisms 06/12/2016 -- was seen 2 days ago and was supposed to wear his compression stockings at home but he is developed lymphedema and superficial blisters on the left lower extremity and hence came in for a review 06/24/16; the remaining wound is on his left anterior leg. He still has edema coming from between his toes. There is lymphedema here however his edema is generally better than when I last saw this. He has a history of atrial fibrillation  but does not have a known history of congestive heart failure nevertheless I think he probably has this at least on a diastolic basis. 07/01/16 I reviewed his echocardiogram from January 2017. This was  essentially normal. He did not have LVH, EF of 55-60%. His right ventricular function was normal although he did have trivial tricuspid and pulmonic regurgitation. This is not audible on exam however. I increased his Lasix to do massive edema in his legs well above his knees I think in early July. He was also drinking an excessive amount of water at the time. 07/15/16; missed his appointment last week because of the Labor Day holiday on Monday. He could not get another appointment later in the week. Started to feel the wrap digging in superiorly so we remove the top half and the bottom half of his wrap. He has extensive erythema and blistering superiorly in the left leg. Very tender. Very swollen. Edema in his foot with leaking edema fluid. He has not been systemically unwell 07/22/16; the area on the left leg laterally required some debridement. The medial wounds look more stable. His wrap injury wounds appear to have healed. Edema and his foot is better, weeping edema is also better. He tells me he is meeting with the supplier of the external compression pumps at work 08/05/16; the patient was on vacation last week in [facility]. His wrap is been on for an extended period of time. Also over the weekend he developed an extensive area of tender erythema across his anterior medial thigh. He took to doxycycline yesterday that he had leftover from a previous prescription. The patient complains of weeping edema coming out of his toes 08/08/16; I saw this patient on 10/2. He was tender across his anterior thigh. I put him on doxycycline. He returns today in follow-up. He does not have any open wounds on his lower leg, he still has edema weeping into his toes. 08/12/16; patient was seen back urgently today to  follow-up for his extensive left thigh cellulitis/erysipelas. He comes back with a lot less swelling and erythema pain is much better. I believe I gave him Augmentin and Cipro. His wrap was cut down as he stated a roll down his legs. He developed blistering above the level of the wrap that remained. He has 2 open blisters and 1 intact. 08/19/16; patient is been doing his primary doctor who is increased his Lasix from 40-80 once a day or 80 already has less edema. Cellulitis has remained improved in the left thigh. 2 open areas on the posterior left calf 08/26/16; he returns today having new open blisters on the anterior part of his left leg. He has his compression pumps but is not yet been shown how to use some vital representative from the supplier. 09/02/16 patient returns today with no open wounds on the left leg. Some maceration in his plantar toes 09/10/2016 -- Dr. Dellia Nims had recently discharged him on 09/02/2016 and he has come right back with redness swelling and some open ulcers on his left lower extremity. He says this was caused by trying to apply his compression stockings and he's been unable to use this and has not been able to use his lymphedema pumps. He had some doxycycline leftover and he has started on this a few days ago. 09/16/16; there are no open wounds on his leg on the left and no evidence of cellulitis. 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 He does not complain of symptoms here. I am not clear use using his external compression pumps. 09/23/16; I have not seen this patient in 2 weeks. He canceled his appointment 10 days ago as  he was going on vacation. He tells me that on Monday he noticed a large area on his posterior left leg which is been draining copiously and is reopened into a large wound. He is been using ABDs and the external part of his juxtalite, according to our nurse this was not on properly. 10/07/16;  Still a substantial area on the posterior left leg. Using silver alginate 10/14/16; in general better although there is still open area which looks healthy. Still using silver alginate. He reminds me that this happen before he left for [facility]. T oday while he was showering in the morning. He had been using his juxtalite's 10/21/16; the area on his posterior left leg is fully epithelialized. However he arrives today with a large area of tender erythema in his medial and posterior left thigh just above the knee. I have marked the area. Once again he is reluctant to consider hospitalization. I treated him with oral antibiotics in the past for a similar situation with resolution I think with doxycycline however this area it seems more extensive to me. He is not complaining of fever but does have chills and says states he is thirsty. His blood sugar today was in the 140s at home 10/25/16 the area on his posterior left leg is fully epithelialized although there is still some weeping edema. The large area of tenderness and erythema in his medial and posterior left thigh is a lot less tender although there is still a lot of swelling in this thigh. He states he feels a lot better. He is on doxycycline and Augmentin that I started last week. This will continued until Tuesday, December 26. I have ordered a duplex ultrasound of the left thigh rule out DVT whether there is an abscess something that would need to be drained I would also like to know. 11/01/16; he still has weeping edema from a not fully epithelialized area on his left posterior calf. Most of the rest of this looks a lot better. He has completed his antibiotics. His thigh is a lot better. Duplex ultrasound did not show a DVT in the thigh 11/08/16; he comes in today with more Denuded surface epithelium from the posterior aspect of his calf. There is no real evidence of cellulitis. The superior aspect of his wrap appears to have put quite an  indentation in his leg just below the knee and this may have contributed. He does not complain of pain or fever. We have been using silver alginate as the primary dressing. The area of cellulitis in the right thigh has totally resolved. He has been using his compression stockings once a week 11/15/16; the patient arrives today with more loss of epithelium from the posterior aspect of his left calf. He now has a fairly substantial wound in this area. The reason behind this deterioration isn't exactly clear although his edema is not well controlled. He states he feels he is generally more swollen systemically. He is not complaining of chest pain shortness of breath fever. T me he has an appointment with his primary physician in early February. He is on 80 mg of oral ells Lasix a day. He claims compliance with the external compression pumps. He is not having any pain in his legs similar to what he has with his recurrent cellulitis 11/22/16; the patient arrives a follow-up of his large area on his left lateral calf. This looks somewhat better today. He came in earlier in the week for a dressing change since I  saw him a week ago. He is not complaining of any pain no shortness of breath no chest pain 11/28/16; the patient arrives for follow-up of his large area on the left lateral calf this does not look better. In fact it is larger weeping edema. The surface of the wound does not look too bad. We have been using silver alginate although I'm not certain that this is a dressing issue. 12/05/16; again the patient follows up for a large wound on the left lateral and left posterior calf this does not look better. There continues to be weeping edema necrotic surface tissue. More worrisome than this once again there is erythema below the wound involving the distal Achilles and heel suggestive of cellulitis. He is on his feet working most of the day of this is not going well. We are changing his dressing twice a week to  facilitate the drainage. 12/12/16; not much change in the overall dimensions of the large area on the left posterior calf. This is very inflamed looking. I gave him an. Doxycycline last week does not really seem to have helped. He found the wrap very painful indeed it seems to of dog into his legs superiorly and perhaps around the heel. He came in early today because the drainage had soaked through his dressings. 12/19/16- patient arrives for follow-up evaluation of his left lower extremity ulcers. He states that he is using his lymphedema pumps once daily when there is "no drainage". He admits to not using his lipedema pumps while under current treatment. His blood sugars have been consistently between 150-200. 12/26/16; the patient is not using his compression pumps at home because of the wetness on his feet. I've advised him that I think it's important for him to use this daily. He finds his feet too wet, he can put a plastic bag over his legs while he is in the pumps. Otherwise I think will be in a vicious circle. We are using silver alginate to the major area on his left posterior calf 01/02/17; the patient's posterior left leg has further of all into 3 open wounds. All of them covered with a necrotic surface. He claims to be using his compression pumps once a day. His edema control is marginal. Continue with silver alginate 01/10/17; the patient's left posterior leg actually looks somewhat better. There is less edema, less erythema. Still has 3 open areas covered with a necrotic surface requiring debridement. He claims to be using his compression pumps once a day his edema control is better 01/17/17; the patient's left posterior calf look better last week when I saw him and his wrap was changed 2 days ago. He has noted increasing pain in the left heel and arrives today with much larger wounds extensive erythema extending down into the entire heel area especially tender medially. He is not  systemically unwell CBGs have been controlled no fever. Our intake nurse showed me limegreen drainage on his AVD pads. 01/24/17; his usual this patient responds nicely to antibiotics last week giving him Levaquin for presumed Pseudomonas. The whole entire posterior part of his leg is much better much less inflamed and in the case of his Achilles heel area much less tender. He has also had some epithelialization posteriorly there are still open areas here and still draining but overall considerably better 01/31/17- He has continue to tolerate the compression wraps. he states that he continues to use the lymphedema pumps daily, and can increase to twice daily on the weekends. He  is voicing no complaints or concerns regarding his LLE ulcers 02/07/17-he is here for follow-up evaluation. He states that he noted some erythema to the left medial and anterior thigh, which he states is new as of yesterday. He is concerned about recurrent cellulitis. He states his blood sugars have been slightly elevated, this morning in the 180s 02/14/17; he is here for follow-up evaluation. When he was last here there was erythema superiorly from his posterior wound in his anterior thigh. He was prescribed Levaquin however a culture of the wound surface grew MRSA over the phone I changed him to doxycycline on Monday and things seem to be a lot better. 02/24/17; patient missed his appointment on Friday therefore we changed his nurse visit into a physician visit today. Still using silver alginate on the large area of the posterior left thigh. He isn't new area on the dorsal left second toe 03/03/17; actually better today although he admits he has not used his external compression pumps in the last 2 days or so because of work responsibilities over the weekend. 03/10/17; continued improvement. External compression pumps once a day almost all of his wounds have closed on the posterior left calf. Better edema control 03/17/17; in general  improved. He still has 3 small open areas on the lateral aspect of his left leg however most of the area on the posterior part of his leg is epithelialized. He has better edema control. He has an ABD pad under his stocking on the right anterior lower leg although he did not let us look at that today. 03/24/17; patient arrives back in clinic today with no open areas however there are areas on the posterior left calf and anterior left calf that are less than 100% epithelialized. His edema is well controlled in the left lower leg. There is some pitting edema probably lymphedema in the left upper thigh. He uses compression pumps at home once per day. I tried to get him to do this twice a day although he is very reticent. 04/01/2017 -- for the last 2 days he's had significant redness, tenderness and weeping and came in for an urgent visit today. 04/07/17; patient still has 6 more days of doxycycline. He was seen by Dr. Con Memos last Wednesday for cellulitis involving the posterior aspect, lateral aspect of his Involving his heel. For the most part he is better there is less erythema and less weeping. He has been on his feet for 12 hours o2 over the weekend. Using his compression pumps once a day 04/14/17 arrives today with continued improvement. Only one area on the posterior left calf that is not fully epithelialized. He has intense bilateral venous inflammation associated with his chronic venous insufficiency disease and secondary lymphedema. We have been using silver alginate to the left posterior calf wound In passing he tells [place] today that the right leg but we have not seen in quite some time has an open area on it but he doesn't want [place] to look at this today states he will show this to [place] next week. 04/21/17; there is no open area on his left leg although he still reports some weeping edema. He showed [place] his right leg today which is the first time we've seen this leg in a long time. He has a large area of  open wound on the right leg anteriorly healthy granulation. Quite a bit of swelling in the right leg and some degree of venous inflammation. He told us about the right leg in  passing last week but states that deterioration in the right leg really only happened over the weekend 04/28/17; there is no open area on the left leg although there is an irritated part on the posterior which is like a wrap injury. The wound on the right leg which was new from last week at least to [place] is a lot better. 05/05/17; still no open area on the left leg. Patient is using his new compression stocking which seems to be doing a good job of controlling the edema. He states he is using his compression pumps once per day. The right leg still has an open wound although it is better in terms of surface area. Required debridement. A lot of pain in the posterior right Achilles marked tenderness. Usually this type of presentation this patient gives concern for an active cellulitis 05/12/17; patient arrives today with his major wound from last week on the right lateral leg somewhat better. Still requiring debridement. He was using his compression stocking on the left leg however that is reopened with superficial wounds anteriorly he did not have an open wound on this leg previously. He is still using his juxta light's once daily at night. He cannot find the time to do this in the morning as he has to be at work by 7 AM 05/19/17; right lateral leg wound looks improved. No debridement required. The concerning area is on the left posterior leg which appears to almost have a subcutaneous hemorrhagic component to it. We've been using silver alginate to all the wounds 05/26/17; the right lateral leg wound continues to look improved. However the area on the left posterior calf is a tightly adherent surface. [place] using silver alginate. Because of the weeping edema in his legs there is very little good alternatives. 06/02/17; the patient left  here last week looking quite good. Major wound on the left posterior calf and a small one on the right lateral calf. Both of these look satisfactory. He tells me that by Wednesday he had noted increased pain in the left leg and drainage. He called on Thursday and Friday to get an appointment here but we were blocked. He did not go to urgent care or his primary physician. He thinks he had a fever on Thursday but did not actually take his temperature. He has not been using his compression pumps on the left leg because of pain. I advised him to go to the emergency room today for IV antibiotics for stents of left leg cellulitis but he has refused I have asked him to take 2 days off work to keep his leg elevated and he has refused this as well. In view of this I'm going to call him and Augmentin and doxycycline. 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 However after a detailed discussion regarding the risk benefits and alternatives he refuses to get admitted to the hospital. With no other recourse I will continue him on oral antibiotics as before and hopefully he'll have his infectious disease consultation this week. 06/16/2017 -- the patient was seen today by the nurse practitioner at infectious disease Ms. Dixon. Her review noted recurrent cellulitis of the lower extremity with tinea pedis of the left foot and she has recommended clindamycin 150 mg  daily for now and she may increase it to 300 mg daily to cover staph and Streptococcus. He has also been advise Lotrimin cream locally. she also had wise IV antibiotics for his condition if it flares up 06/23/17; patient arrives today with drainage bilaterally although the remaining wound on the left posterior calf after  cleaning up today "highlighter yellow drainage" did not look too bad. Unfortunately he has had breakdown on the right anterior leg [previously this leg had not been open and he is using a black stocking] he went to see infectious disease and is been put on clindamycin 150 mg daily, I did not verify the dose although I'm not familiar with using clindamycin in this dosing range, perhaps for prophylaxisoo 06/27/17; I brought this patient back today to follow-up on the wound deterioration on the right lower leg together with surrounding cellulitis. I started him on doxycycline 4 days ago. This area looks better however he comes in today with intense cellulitis on the medial part of his left thigh. This is not have a wound in this area. Extremely tender. We've been using silver alginate to the wounds on the right lower leg left lower leg with bilateral 4 layer compression he is using his external compression pumps once a day 07/04/17; patient's left medial thigh cellulitis looks better. He has not been using his compression pumps as his insert said it was contraindicated with cellulitis. His right leg continues to make improvements all the wounds are still open. We only have one remaining wound on the left posterior calf. Using silver alginate to all open areas. He is on doxycycline which I started a week ago and should be finishing I gave him Augmentin after Thursday's visit for the severe cellulitis on the left medial thigh which fortunately looks better 07/14/17; the patient's left medial thigh cellulitis has resolved. The cellulitis in his right lower calf on the right also looks better. All of his wounds are stable to improved we've been using silver alginate he has completed the antibiotics I have given him. He has clindamycin 150 mg once a day prescribed by infectious disease for prophylaxis, I've advised him to start this now. We have been using bilateral Unna boots over silver alginate to the wound  areas 07/21/17; the patient is been to see infectious disease who noted his recurrent problems with cellulitis. He was not able to tolerate prophylactic clindamycin therefore he is on amoxicillin 500 twice a day. He also had a second daily dose of Lasix added By Dr. Melford Aase but he is not taking this. Nor is he being completely compliant with his compression pumps a especially not this week. He has 2 remaining wounds one on the right posterior lateral lower leg and one on the left posterior medial lower leg. 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 The patient has Unna boots bilaterally. 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 He has bilateral Unna boots for compression and silver alginate to his wounds. Arrives today with his legs looking as good as I have seen him in quite some time. Not surprisingly his wounds look better as well with improvement on the right lateral leg venous insufficiency wound and also the left medial leg. He is still using the compression pumps once a day 08/11/17; both  legs appear to be doing better wounds on the right lateral and left medial legs look better. Skin on the right leg quite good. He is been using silver alginate as the primary dressing. I'm going to use Anasept gel calcium alginate and maintain all the secondary dressings 08/18/17; the patient continues to actually do quite well. The area on his right lateral leg is just about closed the left medial also looks better although it is still moist in this area. His edema is well controlled we have been using Anasept gel with calcium alginate and the usual secondary dressings, 4 layer compression and once daily use of his compression pumps "always been able to manage 09/01/17; the patient  continues to do reasonably well in spite of his trip to [place]. The area on the right lateral leg is epithelialized. Left is much better but still open. He has more edema and more chronic erythema on the left leg [venous inflammation] 09/08/17; he arrives today with no open wound on the right lateral leg and decently controlled edema. Unfortunately his left leg is not nearly as in his good situation as last week.he apparently had increasing edema starting on Saturday. He edema soaked through into his foot so used a plastic bag to walk around his home. The area on the medial right leg which was his open area is about the same however he has lost surface epithelium on the left lateral which is new and he has significant pain in the Achilles area of the left foot. He is already on amoxicillin chronically for prophylaxis of cellulitis in the left leg 09/15/17; he is completed a week of doxycycline and the cellulitis in the left posterior leg and Achilles area is as usual improved. He still has a lot of edema and fluid soaking through his dressings. There is no open wound on the right leg. He saw infectious disease NP today 09/22/17;As usual 1 we transition him from our compression wraps to his stockings things did not go well. He has several small open areas on the right leg. He states this was caused by the compression wrap on his skin although he did not wear this with the stockings over them. He has several superficial areas on the left leg medially laterally posteriorly. He does not have any evidence of active cellulitis especially involving the left Achilles The patient is traveling from [facility] Saturday going to [facility]. He states he isn't attempting to get an appointment with a heel objects wound center there to change his dressings. I am not completely certain whether this will work 10/06/17; the patient came in on Friday for a nurse visit and the nurse reported that his legs actually look  quite good. He arrives in clinic today for his regular follow-up visit. He has a new wound on his left third toe over the PIP probably caused by friction with his footwear. He has small areas on the left leg and a very superficial but epithelialized area on the right anterior lateral lower leg. Other than that his legs look as good as I've seen him in quite some time. We have been using silver alginate Review of systems; no chest pain no shortness of breath other than this a 10 point review of systems negative 10/20/17; seen by Dr. Con Memos last week. He had taken some antibiotics [doxycycline] that he had left over. Dr. Con Memos thought he had candida infection and declined to give him further antibiotics. He has a small wound remaining on  the right lateral leg several areas on the left leg including a larger area on the left posterior several left medial and anterior and a small wound on the left lateral. The area on the left dorsal third toe looks a lot better. ROS; Gen.; no fever, respiratory no cough no sputum Cardiac no chest pain other than this 10 point review of system is negative 10/30/17; patient arrives today having fallen in the bathtub 3 days ago. It took him a while to get up. He has pain and maceration in the wounds on his left leg which have deteriorated. He has not been using his pumps he also has some maceration on the right lateral leg. 11/03/17; patient continues to have weeping edema especially in the left leg. This saturates his dressings which were just put on on 12/27. As usual the doxycycline seems to take care of the cellulitis on his lower leg. He is not complaining of fever, chills, or other systemic symptoms. He states his leg feels a lot better on the doxycycline I gave him empirically. He also apparently gets injections at his primary doctor's officeo Rocephin for cellulitis prophylaxis. I didn't ask him about his compression pump compliance today I think that's probably  marginal. Arrives in the clinic with all of his dressings primary and secondary macerated full of fluid and he has bilateral edema 11/10/17; the patient's right leg looks some better although there is still a cluster of wounds on the right lateral. The left leg is inflamed with almost circumferential skin loss medially to laterally although we are still maintaining anteriorly. He does not have overt cellulitis there is a lot of drainage. He is not using compression pumps. We have been using silver alginate to the wound areas, there are not a lot of options here 11/17/17; the patient's right leg continues to be stable although there is still open wounds, better than last week. The inflammation in the left leg is better. Still loss of surface layer epithelium especially posteriorly. There is no overt cellulitis in the amount of edema and his left leg is really quite good, tells me he is using his compression pumps once a day. 11/24/17; patient's right leg has a small superficial wound laterally this continues to improve. The inflammation in the left leg is still improving however we have continuous surface layer epithelial loss posteriorly. There is no overt cellulitis in the amount of edema in both legs is really quite good. He states he is using his compression pumps on the left leg once a day for 5 out of 7 days 12/01/17; very small superficial areas on the right lateral leg continue to improve. Edema control in both legs is better today. He has continued loss of surface epithelialization and left posterior calf although I think this is better. We have been using silver alginate with large number of absorptive secondary dressings 4 layer on the left Unna boot on the right at his request. He tells me he is using his compression pumps once a day 12/08/17; he has no open area on the right leg is edema control is good here. ooOn the left leg however he has marked erythema and tenderness breakdown of skin. He  has what appears to be a wrap injury just distal to the popliteal fossa. This is the pattern of his recurrent cellulitis area and he apparently received penicillin at his primary physician's office really worked in my view but usually response to doxycycline given it to him several times in  the past 12/15/17; the patient had already deteriorated last Friday when he came in for his nurse check. There was swelling erythema and breakdown in the right leg. He has much worse skin breakdown in the left leg as well multiple open areas medially and posteriorly as well as laterally. He tells me he has been using his compression pumps but tells me he feels that the drainage out of his leg is worse when he uses a compression pumps. T be fair to him he is been saying this o for a while however I don't know that I have really been listening to this. I wonder if the compression pumps are working properly 12/22/17;. Once again he arrives with severe erythema, weeping edema from the left greater than right leg. Noncompliance with compression pumps. New this visit he is complaining of pain on the lateral aspect of the right leg and the medial aspect of his right thigh. He apparently saw his cardiologist Dr. Debara Pickett who was ordered an echocardiogram area and I think this is a step in the right direction 12/25/17; started his doxycycline Monday night. There is still intense erythema of the right leg especially in the anterior thigh although there is less tenderness. The erythema around the wound on the right lateral calf also is less tender. He still complaining of pain in the left heel. His wounds are about the same right lateral left medial left lateral. Superficial but certainly not close to closure. He denies being systemically unwell no fever chills no abdominal pain no diarrhea 12/29/17; back in follow-up of his extensive right calf and right thigh cellulitis. I added amoxicillin to cover possible doxycycline resistant  strep. This seems to of done the trick he is in much less pain there is much less erythema and swelling. He has his echocardiogram at 11:00 this morning. X-ray of the left heel was also negative. 01/05/18; the patient arrived with his edema under much better control. Now that he is retired he is able to use his compression pumps daily and sometimes twice a day per the patient. He has a wound on the right leg the lateral wound looks better. Area on the left leg also looks a lot better. He has no evidence of cellulitis in his bilateral thighs I had a quick peak at his echocardiogram. He is in normal ejection fraction and normal left ventricular function. He has moderate pulmonary hypertension moderately reduced right ventricular function. One would have to wonder about chronic sleep apnea although he says he doesn't snore. He'll review the echocardiogram with his cardiologist. 01/12/18; the patient arrives with the edema in both legs under exemplary control. He is using his compression pumps daily and sometimes twice daily. His wound on the right lateral leg is just about closed. He still has some weeping areas on the posterior left calf and lateral left calf although everything is just about closed here as well. I have spoken with Jeri Lager who is the patient's nurse practitioner and infectious disease. She was concerned that the patient had not understood that the parenteral penicillin injections he was receiving for cellulitis prophylaxis was actually benefiting him. I don't think the patient actually saw that I would tend to agree we were certainly dealing with less infections although he had a serious one last month. 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 He is healed. He'll be placed in juxtalite compression wraps and increase his lymphedema pumps to twice daily. We will follow up  again next week to ensure there are no issues with the new  regiment. 01/20/18-he is here for evaluation of bilateral lower extremity weeping edema. Yesterday he was placed in compression wrap to the right lower extremity and compression stocking to left lower shrubbery. He states he uses lymphedema pumps last night and again this morning and noted a blister to the left lower extremity. On exam he was noted to have drainage to the right lower extremity. He will be placed in Unna boots bilaterally and follow-up next week 01/26/18; patient was actually discharged a week ago to his own juxta light stockings only to return the next day with bilateral lower extremity weeping edema.he was placed in bilateral Unna boots. He arrives today with pain in the back of his left leg. There is no open area on the right leg however there is a linear/wrap injury on the left leg and weeping edema on the left leg posteriorly. I spoke with infectious disease about 10 days ago. They were disappointed that the patient elected to discontinue prophylactic intramuscular penicillin shots as they felt it was particularly beneficial in reducing the frequency of his cellulitis. I discussed this with the patient today. He does not share this view. He'll definitely need antibiotics today. Finally he is traveling to [place] and trauma leaving this Saturday and returning a week later and he does not travel with his pumps. He is going by car 01/30/18; patient was seen 4 days ago and brought back in today for review of cellulitis in the left leg posteriorly. I put him on amoxicillin this really hasn't helped as much as I might like. He is also worried because he is traveling to [facility] by car. Finally we will be rewrapping him. There is no open area on the right leg over his left leg has multiple weeping areas as usual 02/09/18; The same wrap on for 10 days. He did not pick up the last doxycycline I prescribed for him. He apparently took 4 days worth he already had. There is nothing open on  his right leg and the edema control is really quite good. He's had damage in the left leg medially and laterally especially probably related to the prolonged use of Unna boots 02/12/18; the patient arrived in clinic today for a nurse visit/wrap change. He complained of a lot of pain in the left posterior calf. He is taking doxycycline that I previously prescribed for him. Unfortunately even though he used his stockings and apparently used to compression pumps twice a day he has weeping edema coming out of the lateral part of his right leg. This is coming from the lower anterior lateral skin area. 02/16/18; the patient has finished his doxycycline and will finish the amoxicillin 2 days. The area of cellulitis in the left calf posteriorly has resolved. He is no longer having any pain. He tells me he is using his compression pumps at least once a day sometimes twice. 02/23/18; the patient finished his doxycycline and Amoxil last week. On Friday he noticed a small erythematous circle about the size of a quarter on the left lower leg just above his ankle. This rapidly expanded and he now has erythema on the lateral and posterior part of the thigh. This is bright red. Also has an area on the dorsal foot just above his toes and a tender area just below the left popliteal fossa. He came off his prophylactic penicillin injections at his own insistence one or 2 months ago. This is  obviously deteriorated since then 03/02/18; patient is on doxycycline and Amoxil. Culture I did last week of the weeping area on the back of his left calf grew group B strep. I have therefore renewed the amoxicillin 500 3 times a day for a further week. He has not been systemically unwell. Still complaining of an area of discomfort right under his left popliteal fossa. There is no open wound on the right leg. He tells me that he is using his pumps twice a day on most days 03/09/18; patient arrives in clinic today completing his amoxicillin  today. The cellulitis on his left leg is better. Furthermore he tells me that he had intramuscular penicillin shots that his primary care office today. However he also states that the wrap on his right leg fell down shortly after leaving clinic last week. He developed a large blister that was present when he came in for a nurse visit later in the week and then he developed intense discomfort around this area.He tells me he is using his compression pumps 03/16/18; the patient has completed his doxycycline. The infectious part of this/cellulitis in the left heel area left popliteal area is a lot better. He has 2 open areas on the right calf. Still areas on the left calf but this is a lot better as well. 03/24/18; the patient arrives complaining of pain in the left popliteal area again. He thinks some of this is wrap injury. He has no open area on the right leg and really no open area on the left calf either except for the popliteal area. He claims to be compliant with the compression pumps 03/31/18; I gave him doxycycline last week because of cellulitis in the left popliteal area. This is a lot better although the surface epithelium is denuded off and response to this. He arrives today with uncontrolled edema in the right calf area as well as a fingernail injury in the right lateral calf. There is only a few open areas on the left 04/06/18; I gave him amoxicillin doxycycline over the last 2 weeks that the amoxicillin should be completing currently. He is not complaining of any pain or systemic symptoms. The only open areas see has is on the right lateral lower leg paradoxically I cannot see anything on the left lower leg. He tells me he is using his compression pumps twice a day on most days. Silver alginate to the wounds that are open under 4 layer compression 04/13/18; he completed antibiotics and has no new complaints. Using his compression pumps. Silver alginate that anything that's opened 04/20/18; he is  using his compression pumps religiously. Silver alginate 4 layer compression anything that's opened. He comes in today with no open wounds on the left leg but 3 on the right including a new one posteriorly. He has 2 on the right lateral and one on the right posterior. He likes Unna boots on the right leg for reasons that aren't really clear we had the usual 4 layer compression on the left. It may be necessary to move to the 4 layer compression on the right however for now I left them in the Unna boots 04/27/18; he is using his compression pumps at least once a day. He has still the wounds on the right lateral calf. The area right posteriorly has closed. He does not have an open wound on the left under 4 layer compression however on the dorsal left foot just proximal to the toes and the left third toe 2  small open areas were identified 05/11/18; he has not uses compression pumps. The areas on the right lateral calf have coalesced into one large wound necrotic surface. On the left side he has one small wound anteriorly however the edema is now weeping out of a large part of his left leg. He says he wasn't using his pumps because of the weeping fluid. I explained to him that this is the time he needs to pump more 05/18/18; patient states he is using his compression pumps twice a day. The area on the right lateral large wound albeit superficial. On the left side he has innumerable number of small new wounds on the left calf particularly laterally but several anteriorly and medially. All these appear to have healthy granulated base these look like the remnants of blisters however they occurred under compression. The patient arrives in clinic today with his legs somewhat better. There is certainly less edema, less multiple open areas on the left calf and the right anterior leg looks somewhat better as well superficial and a little smaller. However he relates pain and erythema over the last 3-4 days in the thigh  and I looked at this today. He has not been systemically unwell no fever no chills no change in blood sugar values 05/25/18; comes in today in a better state. The severe cellulitis on his left leg seems better with the Keflex. Not as tender. He has not been systemically unwell ooHard to find an open wound on the left lower leg using his compression pumps twice a day ooThe confluent wounds on his right lateral calf somewhat better looking. These will ultimately need debridement I didn't do this today. 06/01/18; the severe cellulitis on the left anterior thigh has resolved and he is completed his Keflex. ooThere is no open wound on the left leg however there is a superficial excoriation at the base of the third toe dorsally. Skin on the bottom of his left foot is macerated looking. ooThe left the wounds on the lateral right leg actually looks some better although he did require debridement of the top half of this wound area with an open curet 06/09/18 on evaluation today patient appears to be doing poorly in regard to his right lower extremity in particular this appears to likely be infected he has very thick purulent discharge along with a bright green tent to the discharge. This makes me concerned about the possibility of pseudomonas. He's also having increased discomfort at this point on evaluation. Fortunately there does not appear to be any evidence of infection spreading to the other location at this time. 06/16/18 on evaluation today patient appears to actually be doing fairly well. His ulcer has actually diminished in size quite significantly at this point which is good news. Nonetheless he still does have some evidence of infection he did see infectious disease this morning before coming here for his appointment. I did review the results of their evaluation and their note today. They did actually have him discontinue the Cipro and initiate treatment with linezolid at this time. He is doing this  for the next seven days and they recommended a follow-up in four months with them. He is the keep a log of the need for intermittent antibiotic therapy between now and when he falls back up with infectious disease. This will help them gaze what exactly they need to do to try and help them out. 06/23/18; the patient arrives today with no open wounds on the left leg and left  third toe healed. He is been using his compression pumps twice a day. On the right lateral leg he still has a sizable wound but this is a lot better than last time I saw this. 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 Has been using silver alginate under 4 layer compression 06/30/18; the only open wound he has is on the right lateral leg and this looks healthy. No debridement is required. We have been using silver alginate. He does not have an open wound on the left leg. There is apparently some drainage from the dorsal proximal third toe on the left although I see no open wound here. 07/03/18 on evaluation today patient was actually here just for a nurse visit rapid change. However when he was here on Wednesday for his rat change due to having been healed on the left and then developing blisters we initiated the wrap again knowing that he would be back today for [place] to reevaluate and see were at. Unfortunately he has developed some cellulitis into the proximal portion of his right lower extremity even into the region of his thigh. 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 He was placed on one as what at that point. Nonetheless he is done with that and has been tolerating it well otherwise. Doxycycline which in the past really did not seem to be effective for him. Nonetheless I think the best option may be for [place] to definitely reinitiate the antibiotics for a longer period of time. 07/07/18;  since I last saw this patient a week ago he has had a difficult time. At that point he did not have an open wound on his left leg. We transitioned him into juxta light stockings. He was apparently in the clinic the next day with blisters on the left lateral and left medial lower calf. He also had weeping edema fluid. He was put back into a compression wrap. He was also in the clinic on Friday with intense erythema in his right thigh. Per the patient he was started on Bactrim however that didn't work at all in terms of relieving his pain and swelling. He has taken 3 doxycycline that he had left over from last time and that seems to of helped. He has blistering on the right thigh as well. 07/14/18; the erythema on his right thigh has gotten better with doxycycline that he is finishing. 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 Presumably this cellulitis in the thigh was not related to that although I think this is a bit concerning going forward. He still has an area on the right lateral calf the blister on the right medial calf just below the knee that was discussed above. On the left 2 small open areas left medial and left lateral. Edema control is adequate. He is using his compression pumps twice a day 07/20/18; continued improvement in the condition of both legs especially the edema in his bilateral thighs. He tells me he is been losing weight through a combination of diet and exercise. He is using his compression pumps twice a day. So overall she made to the remaining wounds 07/27/2018; continued improvement in condition of both legs. His edema is well controlled. The area on the right lateral leg is just about  closed he had one blisters show up on the medial left upper calf. We have him in 4 layer compression. He is going on a 10-day trip to [place], [place] and [place]. He will be driving. He wants to wear Unna boots  because of the lessening amount of constriction. He will not use compression pumps while he is away 08/05/18 on evaluation today patient actually appears to be doing decently well all things considered in regard to his bilateral lower extremities. The worst ulcer is actually only posterior aspect of his left lower extremity with a four layer compression wrap cut into his leg a couple weeks back. He did have a trip and actually had [employer] for the trip that he is worn since he was last here. Nonetheless he feels like the [employer] actually do better for him his swelling is up a little bit but he also with his trip was not taking his Lasix on a regular set schedule like he was supposed to be. He states that obviously the reason being that he cannot drive and keep going without having to urinate too frequently which makes it difficult. He did not have his pumps with him while he was away either which I think also maybe playing a role here too. 08/13/2018; the patient only has a small open wound on the right lateral calf which is a big improvement in the last month or 2. He also has the area posteriorly just below the posterior fossa on the left which I think was a wrap injury from several weeks ago. He has no current evidence of cellulitis. He tells me he is back into his compression pumps twice a day. He also tells me that while he was at the [facility] somebody stole a section of his extremitease stockings 08/20/2018; back in the clinic with a much improved state. He only has small areas on the right lateral mid calf which is just about healed. This was is more substantial area for quite a prolonged period of time. He has a small open area on the left anterior tibia. The area on the posterior calf just below the popliteal fossa is closed today. He is using his compression pumps twice a day 08/28/2018; patient has no open wound on the right leg. He has a smattering of open areas on the calf  with some weeping lymphedema. More problematically than that it looks as though his wraps of slipped down in his usual he has very angry upper area of edema just below the right medial knee and on the right lateral calf. He has no open area on his feet. The patient is traveling to [facility] next week. I will send him in an antibiotic. We will continue to wrap the right leg. We ordered extremitease stockings for him last week and I plan to transition the right leg to a stocking when he gets home which will be in 10 days time. As usual he is very reluctant to take his pumps with him when he travels 09/07/2018; patient returns from [facility]. He shows me a picture of his left leg in the mid part of his trip last week with intense fire engine erythema. The picture look bad enough I would have considered sending him to the hospital. Instead he went to the wound care center in [facility]. They did not prescribe him antibiotics but he did take some doxycycline he had leftover from a previous visit. I had given  him trimethoprim sulfamethoxazole before he left this did not work according to the patient. This is resulted in some improvement fortunately. He comes back with a large wound on the left posterior calf. Smaller area on the left anterior tibia. Denuded blisters on the dorsal left foot over his toes. Does not have much in the way of wounds on the right leg although he does have a very tender area on the right posterior area just below the popliteal fossa also suggestive of infection. He promises me he is back on his pumps twice a day 09/15/2018; the intense cellulitis in his left lower calf is a lot better. The wound area on the posterior left calf is also so better. However he has reasonably extensive wounds on the dorsal aspect of his second and third toes and the proximal foot just at the base of the toes. There is nothing open on the right leg 09/22/2018; the patient has excellent  edema control in his legs bilaterally. He is using his external compression pumps twice a day. He has no open area on the right leg and only the areas in the left foot dorsally second and third toe area on the left side. He does not have any signs of active cellulitis. 10/06/2018; the patient has good edema control bilaterally. He has no open wound on the right leg. There is a blister in the posterior aspect of his left calf that we had to deal with today. He is using his compression pumps twice a day. There is no signs of active cellulitis. We have been using silver alginate to the wound areas. He still has vulnerable areas on the base of his left first second toes dorsally He has a his extremities stockings and we are going to transition him today into the stocking on the right leg. He is cautioned that he will need to continue to use the compression pumps twice a day. If he notices uncontrolled edema in the right leg he may need to go to 3 times a day. 10/13/2018; the patient came in for a nurse check on Friday he has a large flaccid blister on the right medial calf just below the knee. We unroofed this. He has this and a new area underneath the posterior mid calf which was undoubtedly a blister as well. He also has several small areas on the right which is the area we put his extremities stocking on. 10/19/2018; the patient went to see infectious disease this morning I am not sure if that was a routine follow-up in any case the doxycycline I had given him was discontinued and started on linezolid. He has not started this. It is easy to look at his left calf and the inflammation and think this is cellulitis however he is very tender in the tissue just below the popliteal fossa and I have no doubt that there is infection going on here. He states the problem he is having is that with the compression pumps the edema goes down and then starts walking the wrap falls down. We will see if we can adhere this.  He has 1 or 2 minuscule open areas on the right still areas that are weeping on the posterior left calf, the base of his left second and third toes 10/26/18; back today in clinic with quite of skin breakdown in his left anterior leg. This may have been infection the area below the popliteal fossa seems a lot better however tremendous epithelial loss on the left anterior  mid tibia area over quite inexpensive tissue. He has 2 blisters on the right side but no other open wound here. 10/29/2018; came in urgently to see [place] today and we worked him in for review. He states that the 4 layer compression on the right leg caused pain he had to cut it down to roughly his mid calf this caused swelling above the wrap and he has blisters and skin breakdown today. As a result of the pain he has not been using his pumps. Both legs are a lot more edematous and there is a lot of weeping fluid. 11/02/18; arrives in clinic with continued difficulties in the right leg> left. Leg is swollen and painful. multiple skin blisters and new open areas especially laterally. He has not been using his pumps on the right leg. 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 He is not systemically unwell. 11/09/2018; the patient claims he is being compliant with his pumps. He is finished the doxycycline I gave him last week. Culture I did of the wound on the right lateral leg showed a few very resistant methicillin staph aureus. This was resistant to doxycycline. Nevertheless he states the pain in the leg is a lot better which makes me wonder if the cultured organism was not really what was causing the problem nevertheless this is a very dangerous organism to be culturing out of any wound. His right leg is still a lot larger than the left. He is using an Haematologist on this area, he blames a 4-layer compression for causing the original skin breakdown which I doubt is true however I cannot talk him  out of it. We have been using silver alginate to all of these areas which were initially blisters 11/16/2018; patient is being compliant with his external compression pumps at twice a day. Miraculously he arrives in clinic today with absolutely no open wounds. He has better edema control on the left where he has been using 4 layer compression versus wound of wounds on the right and I pointed this out to him. There is no inflammation in the skin in his lower legs which is also somewhat unusual for him. There is no open wounds on the dorsal left foot. He has extremitease stockings at home and I have asked him to bring these in next week. 11/25/18 patient's lower extremity on examination today on the left appears for the most part to be wound free. He does have an open wound on the lateral aspect of the right lower extremity but this is minimal compared to what I've seen in past. He does request that we go ahead and wrap the left leg as well even though there's nothing open just so hopefully it will not reopen in short order. 1/28; patient has superficial open wounds on the right lateral calf left anterior calf and left posterior calf. His edema control is adequate. 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 We have been using silver alginate as the primary dressing. He claims compliance with his compression pumps 2/4; patient has superficial open wounds on numerous areas of his left calf and again one on the left dorsal foot. The areas on the right lateral calf have healed. The cellulitis that I gave him doxycycline for last week is also resolved this was mostly on the left anterior calf just below the tibial tuberosity. His edema looks  fairly well-controlled. He tells me he went to see his primary doctor today and had blood work ordered 2/11; once again he has several open areas on the left calf left tibial area. Most of  these are small and appear to have healthy granulation. He does not have anything open on the right. The edema and control in his thighs is pretty good which is usually a good indication he has been using his pumps as requested. 2/18; he continues to have several small areas on the left calf and left tibial area. Most of these are small healthy granulation. We put him in his stocking on the right leg last week and he arrives with a superficial open area over the right upper tibia and a fairly large area on the right lateral tibia in similar condition. His edema control actually does not look too bad, he claims to be using his compression pumps twice a day 2/25. Continued small areas on the left calf and left tibial area. New areas especially on the right are identified just below the tibial tuberosity and on the right upper tibia itself. There are also areas of weeping edema fluid even without an obvious wound. He does not have a considerable degree of lymphedema but clearly there is more edema here than his skin can handle. He states he is using the pumps twice a day. We have an Unna boot on the right and 4 layer compression on the left. 3/3; he continues to have an area on the right lateral calf and right posterior calf just below the popliteal fossa. There is a fair amount of tenderness around the wound on the popliteal fossa but I did not see any evidence of cellulitis, could just be that the wrap came down and rubbed in this area. ooHe does not have an open area on the left leg however there is an area on the left dorsal foot at the base of the third toe ooWe have been using silver alginate to all wound areas 3/10; he did not have an open area on his left leg last time he was here a week ago. T oday he arrives with a horizontal wound just below the tibial tuberosity and an area on the left lateral calf. He has intense erythema and tenderness in this area. The area is on the right lateral calf  and right posterior calf better than last week. We have been using silver alginate as usual 3/18 - Patient returns with 3 small open areas on left calf, and 1 small open area on right calf, the skin looks ok with no significant erythema, he continues the UNA boot on right and 4 layer compression on left. The right lateral calf wound is closed , the right posterior is small area. we will continue silver alginate to the areas. 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 He is been tolerating the dressing changes without complication. Fortunately she has made excellent improvement in regard to the overall status of his wounds. Unfortunately every time we cease wrapping him he ends up reopening in causing more significant issues at that point. Again I'm unsure of the best direction to take although I think the lymphedema clinic may be appropriate for him. 02/03/19 on evaluation today patient appears to be doing well in regard to the wounds  that we saw him for last week unfortunately he has a new area on the proximal portion of his right medial/posterior lower extremity where the wrap somewhat slowed down and caused swelling and a blister to rub and open. Unfortunately this is the only opening that he has on either leg at this point. 02/17/19 on evaluation today patient's bilateral lower extremities appear to be doing well. He still completely healed in regard to the left lower extremity. In regard to the right lower extremity the area where the wrap and slid down and caused the blister still seems to be slightly open although this is dramatically better than during the last evaluation two weeks ago. I'm very pleased with the way this stands overall. 03/03/19 on evaluation today patient appears to be doing well in  regard to his right lower extremity in general although he did have a new blister open this does not appear to be showing any evidence of active infection at this time. Fortunately there's No fevers, chills, nausea, or vomiting noted at this time. Overall I feel like he is making good progress it does feel like that the right leg will we perform the [employer] seems to do with a bit better than three layer wrap on the left which slid down on him. We may switch to doing bilateral in the book wraps. 5/4; I have not seen Mr. Depaola in quite some time. According to our case manager he did not have an open wound on his left leg last week. He had 1 remaining wound on the right posterior medial calf. He arrives today with multiple openings on the left leg probably were blisters and/or wrap injuries from Unna boots. I do not think the Unna boot's will provide adequate compression on the left. I am also not clear about the frequency he is using the compression pumps. 03/17/19 on evaluation today patient appears to be doing excellent in regard to his lower extremities compared to last week's evaluation apparently. He had gotten significantly worse last week which is unfortunate. The [employer] wrap on the left did not seem to do very well for him at all and in fact it didn't control his swelling significantly enough he had an additional outbreak. Subsequently we go back to the four layer compression wrap on the left. This is good news. At least in that he is doing better and the wound seem to be killing him. He still has not heard anything from the lymphedema clinic. 03/24/19 on evaluation today patient actually appears to be doing much better in regard to his bilateral lower [place] as compared to last week when I saw him. Fortunately there's no signs of active infection at this time. He has been tolerating the dressing changes without complication. Overall I'm extremely pleased with the progress and appearance in  general. 04/07/19 on evaluation today patient appears to be doing well in regard to his bilateral lower extremities. His swelling is significantly down from where it was previous. With that being said he does have a couple blisters still open at this point but fortunately nothing that seems to be too severe and again the majority of the larger openings has healed at this time. 04/14/19 on evaluation today patient actually appears to be doing quite well in regard to his bilateral lower extremities in fact I'm not even sure there's anything significantly open at this time at any site. Nonetheless he did have some trouble with these wraps where they are somewhat irritating him  secondary to the fact that he has noted that the graph wasn't too close down to the end of this foot in a little bit short as well up to his knee. Otherwise things seem to be doing quite well. 04/21/19 upon evaluation today patient's wound bed actually showed evidence of being completely healed in regard to both lower extremities which is excellent news. There does not appear to be any signs of active infection which is also good news. I'm very pleased in this regard. No fevers, chills, nausea, or vomiting noted at this time. 04/28/19 on evaluation today patient appears to be doing a little bit worse in regard to both lower extremities on the left mainly due to the fact that when he went infection disease the wrap was not wrapped quite high enough he developed a blister above this. On the right he is a small open area of nothing too significant but again this is continuing to give him some trouble he has been were in the Velcro compression that he has at home. 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 He's been tolerating the dressing changes without complication. Fortunately there's no signs of active infection at this time. No fevers, chills, nausea, or vomiting noted at  this time. We have been trying to get an appointment with her lymphedema clinic in [place] but unfortunately nobody can get them on phone with not been able to even fax information over the patient likewise is not been able to get in touch with them. Overall I'm not sure exactly what's going on here with to reach out again today. 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 Still having a lot of drainage unfortunately. He tells me especially in the left but even on the right. There's no signs of active infection which is good news we've been using so ratcheted up to this point. 05/19/19 on evaluation today patient actually appears to be doing quite well with regard to his left lower extremity which is great news. Fortunately in regard to the right lower extremity has an issues with his wrap and he subsequently did remove this from what I'm understanding. Nonetheless long story short is what he had rewrapped once he removed it subsequently had maggots underneath this wrap whenever he came in for evaluation today. With that being said they were obviously completely cleaned away by the nursing staff. The visit today which is excellent news. However he does appear to potentially have some infection around the right ankle region where the maggots were located as well. He will likely require anabiotic therapy today. 05/26/19 on evaluation today patient actually appears to be doing much better in regard to his bilateral lower extremities. I feel like the infection is under much better control. With that being said there were maggots noted when the wrap was removed yet again today. Again this could have potentially been left over from previous although at this time there does not appear to be any signs of significant drainage there was obviously on the wrap some drainage as well this contracted gnats or  otherwise. Either way I do not see anything that appears to be doing worse in my pinion and in fact I think his drainage has slowed down quite significantly likely mainly due to the fact to his infection being under better control. 06/02/2019 on evaluation today patient actually  appears to be doing well with regard to his bilateral lower extremities there is no signs of active infection at this time which is great news. With that being said he does have several open areas more so on the right than the left but nonetheless these are all significantly better than previously noted. 06/09/2019 on evaluation today patient actually appears to be doing well. His wrap stayed up and he did not cause any problems he had more drainage on the right compared to the left but overall I do not see any major issues at this time which is great news. 06/16/2019 on evaluation today patient appears to be doing excellent with regard to his lower extremities the only area that is open is a new blister that can have opened as of today on the medial ankle on the left. Other than this he really seems to be doing great I see no major issues at this point. 06/23/2019 on evaluation today patient appears to be doing quite well with regard to his bilateral lower extremities. In fact he actually appears to be almost completely healed there is a small area of weeping noted of the right lower extremity just above the ankle. Nonetheless fortunately there is no signs of active infection at this time which is good news. No fevers, chills, nausea, vomiting, or diarrhea. 8/24; the patient arrived for a nurse visit today but complained of very significant pain in the left leg and therefore I was asked to look at this. Noted that he did not have an open area on the left leg last week nevertheless this was wrapped. The patient states that he is not been able to put his compression pumps on the left leg because of the discomfort. He has not been  systemically unwell 06/30/2019 on evaluation today patient unfortunately despite being excellent last week is doing much worse with regard to his left lower extremity today. In fact he had to come in for a nurse on Monday where his left leg had to be rewrapped due to excessive weeping Dr. Dellia Nims placed him on doxycycline at that point. Fortunately there is no signs of active infection Systemically at this time which is good news. 07/07/2019 in regard to the patient's wounds today he actually seems to be doing well with his right lower extremity there really is nothing open or draining at this point this is great news. Unfortunately the left lower extremity is given him additional trouble at this time. There does not appear to be any signs of active infection nonetheless he does have a lot of edema and swelling noted at this point as well as blistering all of which has led to a much more poor appearing leg at this time compared to where it was 2 weeks ago when it was almost completely healed. Obviously this is a little discouraging for the patient. He is try to contact the lymphedema clinic in [place] he has not been able to get through to them. 07/14/2019 on evaluation today patient actually appears to be doing slightly better with regard to his left lower extremity ulcers. Overall I do feel like at least at the top of the wrap that we have been placing this area has healed quite nicely and looks much better. The remainder of the leg is showing signs of improvement. Unfortunately in the thigh area he still has an open region on the left and again on the right he has been utilizing just a Band-Aid on an area that also  opened on the thigh. Again this is an area that were not able to wrap although we did do an Ace wrap to provide some compression that something that obviously is a little less effective than the compression wraps we have been using on the lower portion of the leg. He does have an  appointment with the lymphedema clinic in [facility] on Friday. 07/21/2019 on evaluation today patient appears to be doing better with regard to his lower extremity ulcers. He has been tolerating the dressing changes without complication. Fortunately there is no signs of active infection at this time. No fevers, chills, nausea, vomiting, or diarrhea. I did receive the paperwork from the physical therapist at the lymphedema clinic in [place]. Subsequently I signed off on that this morning and sent that back to him for further progression with the treatment plan. 07/28/2019 on evaluation today patient appears to be doing very well with regard to his right lower extremity where I do not see any open wounds at this point. Fortunately he is feeling great as far as that is concerned as well. In regard to the left lower extremity he has been having issues with still several areas of weeping and edema although the upper leg is doing better his lower leg still I think is going require the compression wrap at this time. No fevers, chills, nausea, vomiting, or diarrhea. 08/04/2019 on evaluation today patient unfortunately is having new wounds on the right lower extremity. Again we have been using Unna boot wrap on that side. We switched him to using his juxta lite wrap at home. With that being said he tells me he has been using it although his legs extremely swollen and to be honest really does not appear that he has been. I cannot know that for sure however. Nonetheless he has multiple new wounds on the right lower extremity at this time. Obviously we will have to see about getting this rewrapped for him today. 08/11/2019 on evaluation today patient appears to be doing fairly well with regard to his wounds. He has been tolerating the dressing changes including the compression wraps without complication. He still has a lot of edema in his upper thigh regions bilaterally he is supposed to be seeing the  lymphedema clinic on the 15th of this month once his wraps arrive for the upper part of his legs. 08/18/2019 on evaluation today patient appears to be doing well with regard to his bilateral lower extremities at this point. He has been tolerating the dressing changes without complication. Fortunately there is no signs of active infection which is also good news. He does have a couple weeping areas on the first and second toe of the right foot he also has just a small area on the left foot upper leg and a small area on the left lower leg but overall he is doing quite well in my opinion. He is supposed to be getting his wraps shortly in fact tomorrow and then subsequently is seeing the lymphedema clinic next Wednesday on the 21st. Of note he is also leaving on the 25th to go on vacation for a week to the beach. For that reason and since there is some uncertainty about what there can be doing at lymphedema clinic next Wednesday I am get a make an appointment for next Friday here for [place] to see what we need to do for him prior to him leaving for vacation. 10/23; patient arrives in considerable pain predominantly in the upper posterior calf just  distal to the popliteal fossa also in the wound anteriorly above the major wound. This is probably cellulitis and he has had this recurrently in the past. He has no open wound on the right side and he has had an Haematologist in that area. Finally I note that he has an area on the left posterior calf which by enlarge is mostly epithelialized. This protrudes beyond the borders of the surrounding skin in the setting of dry scaly skin and lymphedema. The patient is leaving for [facility] on Sunday. Per his longstanding pattern, he will not take his compression pumps with him predominantly out of fear that they will be stolen. He therefore asked that we put a Unna boot back on the right leg. He will also contact the wound care center in [facility] to see if they can  change his dressing in the mid week. 11/3; patient returned from his vacation to [facility]. He was seen on 1 occasion at their wound care center. They did a 2 layer compression system as they did not have our 4-layer wrap. I am not completely certain what they put on the wounds. They did not change the Unna boot on the right. The patient is also seeing a lymphedema specialist physical therapist in [place]. It appears that he has some compression sleeve for his thighs which indeed look quite a bit better than I am used to seeing. He pumps over these with his external compression pumps. 11/10; the patient has a new wound on the right medial thigh otherwise there is no open areas on the right. He has an area on the left leg posteriorly anteriorly and medially and an area over the left second toe. We have been using silver alginate. He thinks the injury on his thigh is secondary to friction from the compression sleeve he has. 11/17; the patient has a new wound on the right medial thigh last week. He thinks this is because he did not have a underlying stocking for his thigh juxta lite apparatus. He now has this. The area is fairly large and somewhat angry but I do not think he has underlying cellulitis. ooHe has a intact blister on the right anterior tibial area. ooSmall wound on the right great toe dorsally ooSmall area on the medial left calf. 11/30; the patient does not have any open areas on his right leg and we did not take his juxta lite stocking off. However he states that on Friday his compression wrap fell down lodging around his upper mid calf area. As usual this creates a lot of problems for him. He called urgently today to be seen for a nurse visit however the nurse visit turned into a provider visit because of extreme erythema and pain in the left anterior tibia extending laterally and posteriorly. The area that is problematic is extensive 10/06/2019 upon evaluation today patient  actually appears to be doing poorly in regard to his left lower extremity. He Dr. Dellia Nims did place him on doxycycline this past Monday apparently due to the fact that he was doing much worse in regard to this left leg. Fortunately the doxycycline does seem to be helping. Unfortunately we are still having a very difficult time getting his edema under any type of control in order to anticipate discharge at some point. The only way were really able to control his lymphedema really is with compression wraps and that has only even seemingly temporary. He has been seeing a lymphedema clinic they are  trying to help in this regard but still this has been somewhat frustrating in general for the patient. 10/13/19 on evaluation today patient appears to be doing excellent with regard to his right lower extremity as far as the wounds are concerned. His swelling is still quite extensive unfortunately. He is still having a lot of drainage from the thigh areas bilaterally which is unfortunate. He's been going to lymphedema clinic but again he still really does not have this edema under control as far as his lower extremities are concern. With regard to his left lower extremity this seems to be improving and I do believe the doxycycline has been of benefit for him. He is about to complete the doxycycline. 10/20/2019 on evaluation today patient appears to be doing poorly in regard to his bilateral lower extremities. More in the right thigh he has a lot of irritation at this site unfortunately. In regard to the left lower extremity the wrap was not quite as high it appears and does seem to have caused him some trouble as well. Fortunately there is no evidence of systemic infection though he does have some blue-green drainage which has me concerned for the possibility of Pseudomonas. He tells me he is previously taking Cipro without complications and he really does not care for Levaquin however due to some of the  side effects he has. He is not allergic to any medications specifically antibiotics that were aware of. 10/27/2019 on evaluation today patient actually does appear to be for the most part doing better when compared to last week's evaluation. With that being said he still has multiple open wounds over the bilateral lower extremities. He actually forgot to start taking the Cipro and states that he still has the whole bottle. He does have several new blisters on left lower extremity today I think I would recommend he go ahead and take the Cipro based on what I am seeing at this point. 12/30-Patient comes at 1 week visit, 4 layer compression wraps on the left and Unna boot on the right, primary dressing Xtrasorb and silver alginate. Patient is taking his Cipro and has a few more days left probably 5-6, and the legs are doing better. He states he is using his compressions devices which I believe he has 11/10/2019 on evaluation today patient actually appears to be much better than last time I saw him 2 weeks ago. His wounds are significantly improved and overall I am very pleased in this regard. Fortunately there is no signs of active infection at this time. He is just a couple of days away from completing Cipro. Overall his edema is much better he has been using his lymphedema pumps which I think is also helping at this point. 11/17/2019 on evaluation today patient appears to be doing excellent in regard to his wounds in general. His legs are swollen but not nearly as much as they have been in the past. Fortunately he is tolerating the compression wraps without complication. No fevers, chills, nausea, vomiting, or diarrhea. He does have some erythema however in the distal portion of his right lower extremity specifically around the forefoot and toes there is a little bit of warmth here as well. 11/24/2019 on evaluation today patient appears to be doing well with regard to his right lower extremity I really do  not see any open wounds at this point. His left lower extremity does have several open areas and his right medial thigh also is open. Other than this however overall  the patient seems to be making good progress and I am very pleased at this point. 12/01/2019 on evaluation today patient appears to be doing poorly at this point in regard to his left lower extremity has several new blisters despite the fact that we have him in compression wraps. In fact he had a 4-layer compression wrap, his upper thigh wrapped from lymphedema clinic, and a juxta light over top of the 4 layer compression wrap the lymphedema clinic applied and despite all this he still develop blisters underneath. Obviously this does have me concerned about the fact that unfortunately despite what we are doing to try to get wounds healed he continues to have new areas arise I do not think he is ever good to be at the point where he can realistically just use wraps at home to keep things under control. Typically when we heal him it takes about 1-2 days before he is back in the clinic with severe breakdown and blistering of his lower extremities bilaterally. This is happened numerous times in the past. Unfortunately I think that we may need some help as far as overall fluid overload to kind of limit what we are seeing and get things under better control. 12/08/2019 on evaluation today patient presents for follow-up concerning his ongoing bilateral lower extremity edema. Unfortunately he is still having quite a bit of swelling the compression wraps are controlling this to some degree but he did see Dr. Debara Pickett his cardiologist I do have that available for review today as far as the appointment was concerned that was on 12/06/2019. Obviously that she has been 2 days ago. The patient states that he is only been taking the Lasix 80 mg 1 time a day he had told me previously he was taking this twice a day. Nonetheless Dr. Debara Pickett recommended this be up to  80 mg 2 times a day for the patient as he did appear to be fluid overloaded. With that being said the patient states he did this yesterday and he was unable to go anywhere or do anything due to the fact that he was constantly having to urinate. Nonetheless I think that this is still good to be something that is important for him as far as trying to get his edema under control at all things that he is going to be able to just expect his wounds to get under control and things to be better without going through at least a period of time where he is trying to stabilize his fluid management in general and I think increasing the Lasix is likely the first step here. It was also mentioned the possibility that the patient may require metolazone. With that being said he wanted to have the patient take Lasix twice a day first and then reevaluating 2 months to see where things stand. 12/15/2019 upon evaluation today patient appears to be doing regard to his legs although his toes are showing some signs of weeping especially on the left at this point to some degree on the right. There does not appear to be any signs of active infection and overall I do feel like the compression wraps are doing well for him but he has not been able to take the Lasix at home and the increased dose that Dr. Debara Pickett recommended. He tells me that just not go to be feasible for him. Nonetheless I think in this case he should probably send a message to Dr. Debara Pickett in order to discuss options from the  standpoint of possible admission to get the fluid off or otherwise going forward. 12/22/2019 upon evaluation today patient appears to be doing fairly well with regard to his lower extremities at this point. In fact he would be doing excellent if it was not for the fact that his right anterior thigh apparently had an allergic reaction to adhesive tape that he used. The wound itself that we have been monitoring actually appears to be healed. There is  a lot of irritation at this point. 12/29/2019 upon evaluation today patient appears to be doing well in regard to his lower extremities. His left medial thigh is open and somewhat draining today but this is the only region that is open the right has done much better with the treatment utilizing the steroid cream that I prescribed for him last week. Overall I am pleased in that regard. Fortunately there is no signs of active infection at this time. No fevers, chills, nausea, vomiting, or diarrhea. 01/05/2020 upon evaluation today patient appears to be doing more poorly in regard to his right lower extremity at this point upon evaluation today. Unfortunately he continues to have issues in this regard and I think the biggest issue is controlling his edema. This obviously is not very well controlled at this point is been recommended that he use the Lasix twice a day but he has not been able to do that. Unfortunately I think this is leading to an issue where honestly he is not really able to effectively control his edema and therefore the wounds really are not doing significantly better. I do not think that he is going to be able to keep things under good control unless he is able to control his edema much better. I discussed this again in great detail with him today. 01/12/2020 good news is patient actually appears to be doing quite well today at this point. He does have an appointment with lymphedema clinic tomorrow. His legs appear healed and the toe on the left is almost completely healed. In general I am very pleased with how things stand at this point. 01/19/2020 upon evaluation today patient appears to actually be doing well in regard to his lower extremities there is nothing open at this point. Fortunately he has done extremely well more recently. Has been seeing lymphedema clinic as well. With that being said he has Velcro wraps for his lower legs as well as his upper legs. The only wound really is on  his toe which is the right great toe and this is barely anything even there. With all that being said I think it is good to be appropriate today to go ahead and switch him over to the Velcro compression wraps. 01/26/2020 upon evaluation today patient appears to be doing worse with regard to his lower extremities after last week switch him to Velcro compression wraps. Unfortunately he lasted less than 24 hours he did not have the sock portion of his Velcro wrap on the left leg and subsequently developed a blister underneath the Velcro portion. Obviously this is not good and not what we were looking for at this point. He states the lymphedema clinic did tell him to wear the wrap for 23 hours and take him off for 1 I am okay with that plan but again right now we got a get things back under control again he may have some cellulitis noted as well. 02/02/2020 upon evaluation today patient unfortunately appears to have several areas of blistering on his bilateral lower  extremities today mainly on the feet. His legs do seem to be doing somewhat better which is good news. Fortunately there is no evidence of active infection at this time. No fevers, chills, nausea, vomiting, or diarrhea. 02/16/2020 upon evaluation today patient appears to be doing well at this time with regard to his legs. He has a couple weeping areas on his toes but for the most part everything is doing better and does appear to be sealed up on his legs which is excellent news. We can continue with wrapping him at this point as he had every time we discontinue the wraps he just breaks out with new wounds. There is really no point in is going forward with this at this point. 03/08/2020 upon evaluation today patient actually appears to be doing quite well with regard to his lower extremity ulcers. He has just a very superficial and really almost nonexistent blister on the left lower extremity he has in general done very well with the compression  wraps. With that being said I do not see any signs of infection at this time which is good news. 03/29/2020 upon evaluation today patient appears to be doing well with regard to his wounds currently except for where he had several new areas that opened up due to some of the wrap slipping and causing him trouble. He states he did not realize they had slipped. Nonetheless he has a 1 area on the right and 3 new areas on the left. Fortunately there is no signs of active infection at this time which is great news. 04/05/2020 upon evaluation today patient actually appears to be doing quite well in general in regard to his legs currently. Fortunately there is no signs of active infection at this time. No fevers, chills, nausea, vomiting, or diarrhea. He tells me next week that he will actually be seen in the lymphedema clinic on Thursday at 10 AM I see him on Wednesday next week. 04/12/2020 upon evaluation today patient appears to be doing very well with regard to his lower extremities bilaterally. In fact he does not appear to have any open wounds at this point which is good news. Fortunately there is no signs of active infection at this time. No fevers, chills, nausea, vomiting, or diarrhea. 04/19/2020 upon evaluation today patient appears to be doing well with regard to his wounds currently on the bilateral lower extremities. There does not appear to be any signs of active infection at this time. Fortunately there is no evidence of systemic infection and overall very pleased at this point. Nonetheless after I held him out last week he literally had blisters the next morning already which swelled up with him being right back here in the clinic. Overall I think that he is just not can be able to be discharged with his legs the way they are he is much to volume overloaded as far as fluid is concerned and that was discussed with him today of also discussed this but should try the clinic nurse manager as well as Dr.  Dellia Nims. 04/26/2020 upon evaluation today patient appears to be doing better with regard to his wounds currently. He is making some progress and overall swelling is under good control with the compression wraps. Fortunately there is no evidence of active infection at this time. 05/10/2020 on evaluation today patient appears to be doing overall well in regard to his lower extremities bilaterally. He is Tolerating the compression wraps without complication and with what we are seeing currently  I feel like that he is making excellent progress. There is no signs of active infection at this time. 05/24/2020 upon evaluation today patient appears to be doing well in regard to his legs. The swelling is actually quite a bit down compared to where it has been in the past. Fortunately there is no sign of active infection at this time which is also good news. With that being said he does have several wounds on his toes that have opened up at this point. 05/31/2020 upon evaluation today patient appears to be doing well with regard to his legs bilaterally where he really has no significant fluid buildup at this point overall he seems to be doing quite well. Very pleased in this regard. With regard to his toes these also seem to be drying up which is excellent. We have continue to wrap him as every time we tried as a transition to the juxta light wraps things just do not seem to get any better. 06/07/2020 upon evaluation today patient appears to be doing well with regard to his right leg at this point. Unfortunately left leg has a lot of blistering he tells me the wrap started to slide down on him when he tried to put his other Velcro wrap over top of it to help keep things in order but nonetheless still had some issues. 06/14/2020 on evaluation today patient appears to be doing well with regard to his lower extremity ulcers and foot ulcers at this point. I feel like everything is actually showing signs of improvement which  is great news overall there is no signs of active infection at this time. No fevers, chills, nausea, vomiting, or diarrhea. 06/21/2020 on evaluation today patient actually appears to be doing okay in regard to his wounds in general. With that being said the biggest issue I see is on his right foot in particular the first and second toe seem to be doing a little worse due to the fact this is staying very wet. I think he is probably getting need to change out his dressings a couple times in between each week when we see him in regard to his toes in order to keep this drier based on the location and how this is proceeding. 06/28/2020 on evaluation today patient appears to be doing a little bit more poorly overall in regard to the appearance of the skin I am actually somewhat concerned about the possibility of him having a little bit of an infection here. We discussed the course of potentially giving him a doxycycline prescription which he is taken previously with good result. With that being said I do believe that this is potentially mild and at this point easily fixed. I just do not want anything to get any worse. 07/12/2020 upon evaluation today patient actually appears to be making some progress with regard to his legs which is great news there does not appear to be any evidence of active infection. Overall very pleased with where things stand. 07/26/2020 upon evaluation today patient appears to be doing well with regard to his leg ulcers and toe ulcers at this point. He has been tolerating the compression wraps without complication overall very pleased in this regard. 08/09/2020 upon evaluation today patient appears to be doing well with regard to his lower extremities bilaterally. Fortunately there is no signs of active infection overall I am pleased with where things stand. 08/23/2020 on evaluation today patient appears to be doing well with regard to his wound. He has  been tolerating the dressing  changes without complication. Fortunately there is no signs of active infection at this time. Overall his legs seem to be doing quite well which is great news and I am very pleased in that regard. No fevers, chills, nausea, vomiting, or diarrhea. 09/13/2020 upon evaluation today patient appears to be doing okay in regard to his lower extremities. He does have a fairly large blister on the right leg which I did remove the blister tissue from today so we can get this to dry out other than that however he seems to be doing quite well. There is no signs of active infection at this time. 09/27/2020 upon evaluation today patient appears to actually be doing some better in regard to his right leg. Fortunately signs of active infection at this time which is great news. No fevers, chills, nausea, vomiting, or diarrhea. 10/04/2020 upon evaluation today patient actually appears to be showing signs of improvement which is great news with regard to his leg ulcers. Fortunately there is no signs of active infection which is great news he is still taking the antibiotics currently. No fevers, chills, nausea, vomiting, or diarrhea. 10/18/2020 on evaluation today patient appears to be doing well with regard to his legs currently. He has been tolerating the dressing changes including the wraps without complication. Fortunately there is no signs of active infection at this time. No fevers, chills, nausea, vomiting, or diarrhea. 10/25/2020 upon evaluation today patient appears to be doing decently well in regard to his wounds currently. He has been tolerating the dressing changes without complication. Overall I feel like he is making good progress albeit slow. Again this is something we can have to continue to wrap for some time to come most likely. 11/08/2020 upon evaluation today patient appears to be doing well with regard to his wounds currently. He has been tolerating the dressing changes without complication is not  currently on any antibiotics and he does not appear to show any signs of infection. He does continue to have a lot of drainage on the right leg not too severe but nonetheless this is very scattered. On the left leg this is looking to be much improved overall. 11/15/2020 upon evaluation today patient appears to be doing better with regard to his legs bilaterally. Especially the right leg which was much more significant last week. There does not appear to be any signs of active infection which is great news. No fevers, chills, nausea, vomiting, or diarrhea. 11/23/2019 upon evaluation today patient appears to be doing poorly still in regard to his lower extremities bilaterally. Unfortunately his right leg in particular appears to be doing much more poorly there is no signs really of infection this is not warm to touch but he does have a lot of drainage and weeping unfortunately. With that reason I do believe that we may need to initiate some treatment here to try to help calm down some of the swelling of the right leg. I think switching to a 4-layer compression wrap would be beneficial here. The patient is in agreement with giving this a try. 11/29/2020 upon evaluation today patient appears to be doing well currently in regard to his leg ulcers. I feel like the right leg is doing better he still has a lot of drainage but we do see some improvement here. The 4-layer compression wrap I think was helpful. 12/06/2020 upon evaluation today patient appears to be doing well with regard to his legs. In fact they seem to be  doing about the best I have seen up to this point. Fortunately there is no signs of active infection at this time. No fevers, chills, nausea, vomiting, or diarrhea. 12/20/2020 upon evaluation today patient appears to be doing well at this time in regard to his legs. He is not having any significant draining which is great news. Fortunately there is no signs of active infection at this time. No fevers,  chills, nausea, vomiting, or diarrhea. 01/17/2021 upon evaluation today Teon actually appears to be doing excellent in regard to his legs. He has a few areas again that come and go as far as his toes are concerned but overall this is doing quite well. 01/31/2021 upon evaluation today patient appears to be doing well with regard to his legs. Fortunately there does not appear to be any signs of active infection which is great news. Overall he is still having significant edema despite the compression wraps basically the 4-layer compression wrap to just keep things under control there is really not much room for play. 4/13: [name] is a longstanding patient in our clinic and benefits greatly from weekly compression wraps. Today he has no complaints. He has been tolerating the wraps well. He states he is using the lymphedema pumps at home. 5/4; patient presents for follow-up of his chronic lymphedema/venous insufficiency ulcers. He comes weekly for compression wraps. He has no complaints today. He was unable to tolerate the Coflex 2 layer Last week so we will do the four press 4-layer compression. He has been using his lymphedema pumps daily. 5/18; patient presents for 2-week follow-up. He has no complaints or issues today. He has developed a new wound to the right foot on his fourth toe. He overall feels well and denies signs of infection. 6/1; patient presents for 2-week follow-up. He has no complaints or issues today. He denies signs of infection. 04/18/2021 upon evaluation today patient appears to be doing well with regard to his legs bilaterally. Family open wound is actually on the toe of his left foot everything else is completely closed which is great news. In general I am extremely pleased with where things stand at this point. The patient is also happy that things are doing so well. 05/02/2021 upon evaluation today patient's legs actually appear to be doing quite well today. Fortunately there  does not appear to be any signs of active infection which is great and overall I am extremely pleased with where he stands today. The patient does not appear to have any evidence of active infection at this time which is also great news. 05/09/2021 upon evaluation today patient appears to be doing a little bit more poorly in regard to his legs. Unfortunately he is having issues with some breakdown and a blood blister on the left leg this is due to I believe honestly to how it was wrapped last week. Fortunately there does not appear to be any signs of infection but nonetheless this is still a concern to be honest. No fevers, chills, nausea, vomiting, or diarrhea. 05/16/2021 upon evaluation today patient appears to be doing significantly better as compared to last week. I am very pleased with where things stand today. There does not appear to be any signs of infection which is great news and overall very pleased with where we stand. No fevers, chills, nausea, vomiting, or diarrhea. 05/30/2021 upon evaluation today patient appears to be doing well with regard to his legs. He has been tolerating the dressing changes without complication. Fortunately there  does not appear to be any signs of active infection which is great news and overall I am extremely pleased with where things stand today. No fevers, chills, nausea, vomiting, or diarrhea. 06/20/2021 upon evaluation today patient actually appears to be making good progress today and very pleased with what we are seeing. I think his legs are really maintaining. As long as we continue wrapping he seems to be doing excellent in my opinion. Fortunately there is no signs of active infection at this time. No fevers, chills, nausea, vomiting, or diarrhea. 07/11/2021 upon evaluation today patient actually appears to be making excellent progress at this time. Fortunately there does not appear to be any evidence of active infection which is great news and overall I am  extremely pleased with where things stand today. No fevers, chills, nausea, vomiting, or diarrhea. 07/25/2021 upon evaluation today patient appears to be doing well currently in regard to his lower extremities. He has been making good progress here and I do not see anything that is actually open significantly today this is great news. No fevers, chills, nausea, vomiting, or diarrhea. 08/08/2021 upon evaluation today patient appears to be doing well with regard to his wound. He has been tolerating the dressing changes without complication. With that being said unfortunately has a new area that opened up as far as his right posterior leg is concerned this was a blister he also has an area on the third toe right foot which also reopen. Fortunately there is no signs of active infection at this time which is great news. No fevers, chills, nausea, vomiting, or diarrhea. 10/17; patient came in today at his request initially for a nurse visit because it but out of concern for deterioration in both his lower legs and cellulitis I was asked to look at him. He comes in with increased swelling which he says started over the weekend he started to notice pain as well in his left medial ankle, right knee, left knee left dorsal foot. His wraps fell down contributing to some of this but he has not been using his compression pumps over the weekend for reasons that are not really clear. He comes in with multiple new wounds including the right posterior leg, right third toe, right fourth toe, left second toe left medial ankle left dorsal ankle and right anterior lower leg 09/19/2021 upon evaluation today patient does seem to be making improvements in general which is great news. I do not see any evidence of infection currently he does have some hypergranulation of the anterior portion of his ankle on the left side this is going require some debridement to pare this down and then subsequently silver nitrate probably due to  the amount of bleeding that he is probably going to experience. He is in agreement with this plan however. 09/26/2021 upon evaluation today although the patient's legs appear to be doing okay today unfortunately he did have maggots noted during the evaluation as well. This is again quite unfortunate with the respect to the fact that he feels like that he got the wrap wet which was noted today he is not sure when and I think this is what led to the issue. Nonetheless we can try to see what we can do about silly things off so that this does not happen again in the future although he is can have to be diligent about taking care of his wraps as well. 10/03/2021 upon evaluation today patient appears to be doing okay in regard to  his legs currently. He unfortunately has maggots again noted on the left foot. This obviously is becoming quite an issue to be perfectly honest. I do think that he needs to see what can be done at home to try to limit this exposure. Last week we had cleaned everything away so this is a new infestation as far as that is concerned. There is really not much that I can do to combat that I am trying to do what we can here in the clinic to wrap his foot and try to prevent any access of that again with knots there is a small this becomes very difficult. 10/10/2021 upon evaluation today patient appears to be doing poorly in regard to his legs he is very swollen and unfortunately I think a big part of the issue here is simply that he is not taking his fluid pills appropriately stressed probably is not helping much at all either. He still having a lot of issues as far as getting moved and having to be out of his current living condition. With that being said he does have of note maggots noted on the right foot this time completely separate from what we have been seeing he tells me that he got his wraps wet again. Its mainly when it rains it sounds like that he goes out to his car in the area  where he has to park there is a area that collects a fairly large puddle according to what he tells me today either way I explained to him which we have done before as well that if he gets his wraps wet he needs to let us know so we can get them changed out. He does not need to keep them on wet. 10/17/2021 upon evaluation today patient appears to be doing well in regard to his legs. In fact things are significantly better compared to where they were previous. Fortunately I see no evidence of infection currently which is great news. No fevers, chills, nausea, vomiting, or diarrhea. 10/24/2021 upon evaluation today patient appears to be doing awesome in regard to his legs in general. I think everything is showing signs of significant improvement which is great news. There is really only 1 opening on the second toe left foot everything else is closed. 11/07/2020 upon evaluation today patient appears to be doing well with regard to his legs in general he does have a blister on the left foot but otherwise things are doing overall quite well. Objective Constitutional Well-nourished and well-hydrated in no acute distress. Vitals Time Taken: 10:35 AM, Height: 70 in, Weight: 380.2 lbs, BMI: 54.5, Temperature: 98.1 F, Pulse: 80 bpm, Respiratory Rate: 20 breaths/min, Blood Pressure: 161/64 mmHg. Respiratory normal breathing without difficulty. Psychiatric this patient is able to make decisions and demonstrates good insight into disease process. Alert and Oriented x 3. pleasant and cooperative. General Notes: Upon inspection patient's wound bed actually showed signs of good granulation and epithelization at this point. Fortunately there does not appear to be any evidence of active infection locally nor systemically at this time. Integumentary (Hair, Skin) Wound #204 status is Open. Original cause of wound was Gradually Appeared. The date acquired was: 09/26/2021. The wound has been in treatment 6 weeks. The  wound is located on the Left T Second. The wound measures 1.7cm length x 1cm width x 0.1cm depth; 1.335cm^2 area and 0.134cm^3 volume. There is oe Fat Layer (Subcutaneous Tissue) exposed. There is no tunneling or undermining noted. There is a medium amount  of serosanguineous drainage noted. The wound margin is distinct with the outline attached to the wound base. There is large (67-100%) pink, pale granulation within the wound bed. There is no necrotic tissue within the wound bed. Wound #210 status is Open. Original cause of wound was Blister. The date acquired was: 11/07/2021. The wound is located on the Left,Dorsal Foot. The wound measures 2cm length x 2.6cm width x 0.1cm depth; 4.084cm^2 area and 0.408cm^3 volume. There is Fat Layer (Subcutaneous Tissue) exposed. There is no tunneling or undermining noted. There is a medium amount of serosanguineous drainage noted. The wound margin is distinct with the outline attached to the wound base. There is large (67-100%) pink, pale granulation within the wound bed. There is no necrotic tissue within the wound bed. 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 There was a Four Layer Compression  Therapy Procedure by Deon Pilling, RN. Post procedure Diagnosis Wound #210: Same as Pre-Procedure There was a Four Layer Compression Therapy Procedure by Deon Pilling, RN. Post procedure Diagnosis Wound #: Same as Pre-Procedure Plan Follow-up Appointments: Return Appointment in 2 weeks. Margarita Grizzle Wednesday Other: - next week Wednesday Bathing/ Shower/ Hygiene: May shower with protection but do not get wound dressing(s) wet. Edema Control - Lymphedema / SCD / Other: Lymphedema Pumps. Use Lymphedema pumps on leg(s) 2-3 times a day for 45-60 minutes. If wearing any wraps or hose, do not remove them. Continue exercising as instructed. Elevate legs to the level of the heart or above for 30 minutes daily and/or when sitting, a frequency of: - throughout the day Avoid standing for long periods of time. Exercise regularly Other Edema Control Orders/Instructions: - right leg 4 layer compression with unna boot to secure to upper portion of lower leg. apply lotion to right leg. WOUND #204: - T Second Wound Laterality: Left oe Prim Dressing: KerraCel Ag Gelling Fiber Dressing, 2x2 in (silver alginate) ary Discharge Instructions: Apply silver alginate to wound bed as instructed Secondary Dressing: Woven Gauze Sponges 2x2 in Discharge Instructions: Apply over primary dressing as directed. Secured With: Coban Self-Adherent Wrap 4x5 (in/yd) Discharge Instructions: Secure with Coban as directed. WOUND #210: - Foot Wound Laterality: Dorsal, Left Cleanser: Soap and Water 1 x Per Week/ Discharge Instructions: May shower and wash wound with dial antibacterial soap and water prior to dressing change. Cleanser: Wound Cleanser 1 x Per Week/ Discharge Instructions: Cleanse the wound with wound cleanser prior to applying a clean dressing using gauze sponges, not tissue or cotton balls. Peri-Wound Care: Zinc Oxide Ointment 30g tube 1 x Per Week/ Discharge Instructions: Apply Zinc Oxide to periwound with each  dressing change Peri-Wound Care: Sween Lotion (Moisturizing lotion) 1 x Per Week/ Discharge Instructions: Apply moisturizing lotion as directed Prim Dressing: KerraCel Ag Gelling Fiber Dressing, 4x5 in (silver alginate) 1 x Per Week/ ary Discharge Instructions:  Apply silver alginate to wound bed as instructed Secondary Dressing: ABD Pad, 8x10 1 x Per Week/ Discharge Instructions: Apply over primary dressing as directed. Com pression Wrap: FourPress (4 layer compression wrap) 1 x Per Week/ Discharge Instructions: Apply four layer compression apply unna boot first layer to upper portion of lower leg. 1. I would recommend that we go ahead and continue with the wound care measures as before and the patient is in agreement the plan. This includes the use of the silver alginate to any open wound locations. I think that still appropriate. 2. I am also can recommend that we have the patient continue with the 4-layer compression wraps bilaterally which I think is also doing an awesome job for him currently. We will see patient back for reevaluation in 1 week here in the clinic. If anything worsens or changes patient will contact our office for additional recommendations. Electronic Signature(s) Signed: 11/07/2021 1:04:27 PM By: Worthy Keeler PA-C Entered By: Worthy Keeler on 11/07/2021 13:04:27 -------------------------------------------------------------------------------- SuperBill Details Patient Name: Date of Service: [name], [name] 11/07/2021 Medical Record Number: [id number] Patient Account Number: [id number] Date of Birth/Sex: Treating RN: 1950-11-22 (71 y.o. 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

## 2021-11-09 ENCOUNTER — Ambulatory Visit (HOSPITAL_COMMUNITY): Payer: Medicare Other | Admitting: Licensed Clinical Social Worker
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
## 2021-11-14 ENCOUNTER — Encounter (HOSPITAL_BASED_OUTPATIENT_CLINIC_OR_DEPARTMENT_OTHER): Payer: Medicare Other | Admitting: Physician Assistant

## 2021-11-14 ENCOUNTER — Other Ambulatory Visit: Payer: Self-pay

## 2021-11-14 DIAGNOSIS — L97519 Non-pressure chronic ulcer of other part of right foot with unspecified severity: Secondary | ICD-10-CM | POA: Diagnosis not present

## 2021-11-14 DIAGNOSIS — E11622 Type 2 diabetes mellitus with other skin ulcer: Secondary | ICD-10-CM | POA: Diagnosis not present

## 2021-11-14 DIAGNOSIS — L97812 Non-pressure chronic ulcer of other part of right lower leg with fat layer exposed: Secondary | ICD-10-CM | POA: Diagnosis not present

## 2021-11-14 DIAGNOSIS — I890 Lymphedema, not elsewhere classified: Secondary | ICD-10-CM | POA: Diagnosis not present

## 2021-11-14 DIAGNOSIS — L03116 Cellulitis of left lower limb: Secondary | ICD-10-CM | POA: Diagnosis not present

## 2021-11-14 DIAGNOSIS — I87333 Chronic venous hypertension (idiopathic) with ulcer and inflammation of bilateral lower extremity: Secondary | ICD-10-CM | POA: Diagnosis not present

## 2021-11-14 DIAGNOSIS — L03115 Cellulitis of right lower limb: Secondary | ICD-10-CM | POA: Diagnosis not present

## 2021-11-14 DIAGNOSIS — E1140 Type 2 diabetes mellitus with diabetic neuropathy, unspecified: Secondary | ICD-10-CM | POA: Diagnosis not present

## 2021-11-14 DIAGNOSIS — L97521 Non-pressure chronic ulcer of other part of left foot limited to breakdown of skin: Secondary | ICD-10-CM | POA: Diagnosis not present

## 2021-11-14 DIAGNOSIS — E1151 Type 2 diabetes mellitus with diabetic peripheral angiopathy without gangrene: Secondary | ICD-10-CM | POA: Diagnosis not present

## 2021-11-14 NOTE — Progress Notes (Addendum)
Kevin, [name] ([id number]) Visit Report for 11/14/2021 Chief Complaint Document Details Patient Name: Date of Service: [name], [name] 11/14/2021 10:30 A M Medical Record Number: [id number] Patient Account Number: [id number] Date of Birth/Sex: Treating RN: 1951-08-26 (71 y.o. [name] Primary Care Provider: Nicoletta Ba Other Clinician: Referring Provider: Treating Provider/Extender: Adele Dan in Treatment: 303 Information Obtained from: Patient Chief Complaint Patient is here for evaluation of venous/lymphedema ulcers Electronic Signature(s) Signed: 11/14/2021 10:43:03 AM By: Lenda Kelp PA-C Entered By: Lenda Kelp on 11/14/2021 10:43:03 -------------------------------------------------------------------------------- Problem List Details Patient Name: Date of Service: [name], [name] 11/14/2021 10:30 A M Medical Record Number: [id number] Patient Account Number: [id number] Date of Birth/Sex: Treating RN: September 15, 1951 (71 y.o. 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, unspecified 01/22/2016 No Yes E11.622 Type 2 diabetes mellitus with other skin ulcer 01/22/2016 No Yes L03.115 Cellulitis of right lower limb 12/22/2017 No Yes Inactive Problems ICD-10 Code Description Active Date Inactive Date 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 06/30/2018 06/30/2018 L97.511 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 of skin 06/30/2018 06/30/2018 Resolved Problems ICD-10 Code Description Active Date Resolved Date L97.211 Non-pressure chronic ulcer of right calf limited to breakdown of skin 06/30/2018 06/30/2018 L97.221 Non-pressure chronic ulcer of left calf limited to breakdown of skin 09/30/2016 09/30/2016 L03.116 Cellulitis of left lower limb 04/01/2017 04/01/2017 L97.211 Non-pressure chronic ulcer of right calf limited to breakdown of skin 06/30/2017 06/30/2017 Electronic Signature(s) Signed: 11/14/2021 10:42:40 AM By: Lenda Kelp PA-C Entered By: Lenda Kelp on 11/14/2021 10:42:38 -------------------------------------------------------------------------------- SuperBill Details Patient Name: Date of Service: [name], [name] 11/14/2021 Medical Record Number: [id number] Patient Account Number: [id number] Date of Birth/Sex: Treating RN: 27-Jun-1951 (71 y.o. [name], [name] Primary Care Provider: Nicoletta Ba Other Clinician: Referring Provider: Treating Provider/Extender: Adele Dan in Treatment: 303 Diagnosis Coding ICD-10 Codes Code Description [id number] Non-pressure chronic ulcer of other part of left foot limited to breakdown of skin L97.829 Non-pressure chronic ulcer of other part of left lower leg with unspecified severity L97.519 Non-pressure chronic ulcer of other part of right foot with unspecified severity L97.812 Non-pressure chronic ulcer of other part of right lower leg with fat layer  exposed I87.333 Chronic venous hypertension (idiopathic) with ulcer and inflammation of bilateral lower extremity L03.116 Cellulitis of left lower limb I89.0 Lymphedema, not elsewhere classified 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 Modifier: Quantity: 1 Electronic Signature(s) Signed: 11/14/2021 12:18:13 PM By: Shawn Stall RN, BSN Signed: 11/14/2021 5:05:22 PM By: Lenda Kelp PA-C Entered By: Shawn Stall on 11/14/2021 12:17:39

## 2021-11-14 NOTE — Progress Notes (Signed)
Kevin, [name] ([id number]) Visit Report for 11/14/2021 Arrival Information Details Patient Name: Date of Service: Kevin Kevin, [name] 11/14/2021 10:30 A M Medical Record Number: [id number] Patient Account Number: [id number] Date of Birth/Sex: Treating RN: [birth year] (71 y.o. [name], [name] Primary Care Meer Reindl: [name] Other Clinician: Referring Shemekia Patane: Treating Minetta Krisher/Extender: [name] in Treatment: 15 Visit Information History Since Last Visit Added or deleted any medications: No Patient Arrived: [name] Any new allergies or adverse reactions: No Arrival Time: 10:00 Had a fall or experienced change in No Accompanied By: self activities of daily living that may affect Transfer Assistance: None risk of falls: Patient Identification Verified: Yes Signs or symptoms of abuse/neglect since last visito No Secondary Verification Process Completed: Yes Hospitalized since last visit: No Patient Requires Transmission-Based Precautions: No Implantable device outside of the clinic excluding No Patient Has Alerts: Yes cellular tissue based products placed in the center since last visit: Has Dressing in Place as Prescribed: Yes Has Compression in Place as Prescribed: Yes Pain Present Now: No Electronic Signature(s) Signed: 11/14/2021 12:18:13 PM By: Deon Pilling RN, BSN Entered By: Deon Pilling on 11/14/2021 12:14:55 -------------------------------------------------------------------------------- Compression Therapy Details Patient Name: Date of Service: Kevin Nora, [name] 11/14/2021 10:30 A M Medical Record Number: [id number] Patient Account Number: [id number] Date of Birth/Sex: Treating RN: 08-Jul-1951 (71 y.o. [name] Primary Care Momoka Stringfield: [name] Other Clinician: Referring Jashayla Glatfelter: Treating Emaline Karnes/Extender: [name] in Treatment: 303 Compression Therapy Performed for Wound Assessment: Wound  #210 Left,Dorsal Foot Performed By: Clinician Deon Pilling, RN Compression Type: Four Layer Electronic Signature(s) Signed: 11/14/2021 12:18:13 PM By: Deon Pilling RN, BSN Entered By: Deon Pilling on 11/14/2021 12:15:51 -------------------------------------------------------------------------------- Compression Therapy Details Patient Name: Date of Service: Kevin Nora, [name] 11/14/2021 10:30 A M Medical Record Number: [id number] Patient Account Number: [id number] Date of Birth/Sex: Treating RN: 01/24/1951 (71 y.o. [name] Primary Care Garyson Stelly: [name] Other Clinician: Referring Kolbi Altadonna: Treating Chimamanda [name]/Extender: [name] in Treatment: 303 Compression Therapy Performed for Wound Assessment: NonWound Condition Lymphedema - Right Leg Performed By: Clinician Deon Pilling, RN Compression Type: Four Layer Electronic Signature(s) Signed: 11/14/2021 12:18:13 PM By: Deon Pilling RN, BSN Entered By: Deon Pilling on 11/14/2021 12:16:11 -------------------------------------------------------------------------------- Encounter Discharge Information Details Patient Name: Date of Service: Kevin [facility], [name] 11/14/2021 10:30 A M Medical Record Number: [id number] Patient Account Number: [id number] Date of Birth/Sex: Treating RN: August 13, 1951 (71 y.o. [name] Primary Care Karoline Fleer: [name] Other Clinician: Referring Zaion Hreha: Treating Kerah Hardebeck/Extender: [name] in Treatment: [phone] Encounter Discharge Information Items Discharge Condition: Stable Ambulatory Status: Walker Discharge Destination: Home Transportation: Private Auto Accompanied By: self Schedule Follow-up Appointment: Yes Clinical Summary of Care: Electronic Signature(s) Signed: 11/14/2021 12:18:13 PM By: Deon Pilling RN, BSN Entered By: Deon Pilling on 11/14/2021  12:17:29 -------------------------------------------------------------------------------- Patient/Caregiver Education Details Patient Name: Date of Service: [name] 1/11/2023andnbsp10:30 [place] Record Number: [id number] Patient Account Number: [id number] Date of Birth/Gender: Treating RN: 1950/12/10 (71 y.o. [name] Primary Care Physician: [name] Other Clinician: Referring Physician: Treating Physician/Extender: [name] in Treatment: 77 Education Assessment Education Provided To: Patient Education Topics Provided Venous: Handouts: Managing Venous Disease and Related Ulcers Methods: Explain/Verbal Responses: Reinforcements needed Electronic Signature(s) Signed: 11/14/2021 12:18:13 PM By: Deon Pilling RN, BSN Entered By: Deon Pilling on 11/14/2021 12:17:15 -------------------------------------------------------------------------------- Wound Assessment Details Patient Name: Date of Service: Kevin [name], [name] 11/14/2021 10:30 A M Medical Record Number: [id number] Patient Account Number: [id number] Date of Birth/Sex: Treating RN: [birth year] (71 y.o. [name] Primary Care Koa Palla: [name] Other Clinician: Referring Shery Waun[place]a: Treating Reiley Bertagnolli/Extender: [name] in Treatment: 303 Wound Status Wound Number: [id number] Primary Etiology: Diabetic Wound/Ulcer of the Lower Extremity Wound Location: Left T Second oe Wound Status: Open Wounding Event: Gradually Appeared Date Acquired: 09/26/2021 Weeks Of Treatment: 7 Clustered Wound: No Wound Measurements Length: (cm) 1.7 Width: (cm) 1 Depth: (cm) 0.1 Area: (cm) 1.335 Volume: (cm) 0.134 % Reduction in Area: 93.7% % Reduction in Volume: 98.4% Wound Description Classification: Grade 2 Exudate Amount: Medium Exudate Type: Serosanguineous Exudate Color: red, brown Treatment Notes Wound #204 (Toe Second) Wound Laterality:  Left Cleanser Peri-Wound Care Topical Primary Dressing KerraCel Ag Gelling Fiber Dressing, 2x2 in (silver alginate) Discharge Instruction: Apply silver alginate to wound bed as instructed Secondary Dressing Woven Gauze Sponges 2x2 in Discharge Instruction: Apply over primary dressing as directed. Secured With [employer] 4x5 (in/yd) Discharge Instruction: Secure with Coban as directed. Compression Wrap Compression Stockings Add-Ons Notes BLE 4 layer compression with lotion and unna boot first layer applied to upper portion of lower legs. Electronic Signature(s) Signed: 11/14/2021 12:18:13 PM By: Deon Pilling RN, BSN Signed: 11/14/2021 12:18:13 PM By: Deon Pilling RN, BSN Entered By: Deon Pilling on 11/14/2021 12:15:21 -------------------------------------------------------------------------------- Wound Assessment Details Patient Name: Date of Service: Kevin [name], Dominico J. 11/14/2021 10:30 A M Medical Record Number: [id number] Patient Account Number: [id number] Date of Birth/Sex: Treating RN: 08-09-1951 (71 y.o. [name] Primary Care Shakoya Gilmore: [name] Other Clinician: Referring Alynn Ellithorpe: Treating Auriana Scalia/Extender: [name] in Treatment: 303 Wound Status Wound Number: [id number] Primary Etiology: Diabetic Wound/Ulcer of the Lower Extremity Wound Location: Left, Dorsal Foot Secondary Etiology: Lymphedema Wounding Event: Blister Wound Status: Open Date Acquired: 11/07/2021 Weeks Of Treatment: 1 Clustered Wound: No Wound Measurements Length: (cm) Width: (cm) Depth: (cm) Area: (cm) Volume: (cm) 0 % Reduction in Area: 100% 0 % Reduction in Volume: 100% 0 0 0 Wound Description Classification: Grade 2 Exudate Amount: Medium Exudate Type: Serosanguineous Exudate Color: red, brown Treatment Notes Wound #210 (Foot) Wound Laterality: Dorsal, Left Cleanser Soap and Water Discharge Instruction: May shower and wash  wound with dial antibacterial soap and water prior to dressing change. Wound Cleanser Discharge Instruction: Cleanse the wound with wound cleanser prior to applying a clean dressing using gauze sponges, not tissue or cotton balls. Peri-Wound Care Zinc Oxide Ointment 30g tube Discharge Instruction: Apply Zinc Oxide to periwound with each dressing change Sween Lotion (Moisturizing lotion) Discharge Instruction: Apply moisturizing lotion as directed Topical Primary Dressing KerraCel Ag Gelling Fiber Dressing, 4x5 in (silver alginate) Discharge Instruction: Apply silver alginate to wound bed as instructed Secondary Dressing ABD Pad, 8x10 Discharge Instruction: Apply over primary dressing as directed. Secured With Compression Wrap FourPress (4 layer compression wrap) Discharge Instruction: Apply four layer compression apply unna boot first layer to upper portion of lower leg. Compression Stockings Add-Ons Notes BLE 4 layer compression with lotion and unna boot first layer applied to upper portion of lower legs. Electronic Signature(s) Signed: 11/14/2021 12:18:13 PM By: Deon Pilling RN, BSN Entered By: Deon Pilling on 11/14/2021 12:15:21

## 2021-11-16 ENCOUNTER — Telehealth: Payer: Self-pay

## 2021-11-16 ENCOUNTER — Other Ambulatory Visit: Payer: Self-pay

## 2021-11-16 ENCOUNTER — Encounter: Payer: Self-pay | Admitting: Family Medicine

## 2021-11-16 MED ORDER — ONETOUCH ULTRA VI STRP
ORAL_STRIP | 0 refills | Status: AC
Start: 2021-11-16 — End: ?

## 2021-11-16 NOTE — Telephone Encounter (Signed)
TC to patient to make contact for [facility] network referral.  No answer, left message to call me at [phone] Juliann Pulse, RN

## 2021-11-20 ENCOUNTER — Other Ambulatory Visit: Payer: Self-pay | Admitting: Family Medicine

## 2021-11-20 ENCOUNTER — Other Ambulatory Visit: Payer: Self-pay | Admitting: *Deleted

## 2021-11-20 ENCOUNTER — Telehealth: Payer: Self-pay

## 2021-11-20 NOTE — Telephone Encounter (Signed)
Made contact with [name], he is currently living in the [employer] on [address].  He has promise of a unit in [employer] in [place].  He is waiting for the unit to be renovated.  He has been evicted thus his current situation of no furniture.  The unit has a fridge and stove.  He will make contact with me as soon as he has a key to the unit.  I will then proceed with [facility] referral.  I will keep in touch with him.  Discussed the [employer].  Pleasant and cooperative.   Juliann Pulse, RN [facility] [phone]

## 2021-11-21 ENCOUNTER — Other Ambulatory Visit: Payer: Self-pay

## 2021-11-21 ENCOUNTER — Encounter: Payer: Self-pay | Admitting: *Deleted

## 2021-11-21 ENCOUNTER — Encounter (HOSPITAL_BASED_OUTPATIENT_CLINIC_OR_DEPARTMENT_OTHER): Payer: Medicare Other | Admitting: Internal Medicine

## 2021-11-21 DIAGNOSIS — L97521 Non-pressure chronic ulcer of other part of left foot limited to breakdown of skin: Secondary | ICD-10-CM | POA: Diagnosis not present

## 2021-11-21 DIAGNOSIS — L97812 Non-pressure chronic ulcer of other part of right lower leg with fat layer exposed: Secondary | ICD-10-CM | POA: Diagnosis not present

## 2021-11-21 DIAGNOSIS — E11622 Type 2 diabetes mellitus with other skin ulcer: Secondary | ICD-10-CM | POA: Diagnosis not present

## 2021-11-21 DIAGNOSIS — L03116 Cellulitis of left lower limb: Secondary | ICD-10-CM | POA: Diagnosis not present

## 2021-11-21 DIAGNOSIS — L03115 Cellulitis of right lower limb: Secondary | ICD-10-CM | POA: Diagnosis not present

## 2021-11-21 DIAGNOSIS — I87333 Chronic venous hypertension (idiopathic) with ulcer and inflammation of bilateral lower extremity: Secondary | ICD-10-CM | POA: Diagnosis not present

## 2021-11-21 DIAGNOSIS — E1151 Type 2 diabetes mellitus with diabetic peripheral angiopathy without gangrene: Secondary | ICD-10-CM | POA: Diagnosis not present

## 2021-11-21 DIAGNOSIS — E1140 Type 2 diabetes mellitus with diabetic neuropathy, unspecified: Secondary | ICD-10-CM | POA: Diagnosis not present

## 2021-11-21 DIAGNOSIS — I890 Lymphedema, not elsewhere classified: Secondary | ICD-10-CM | POA: Diagnosis not present

## 2021-11-21 DIAGNOSIS — L97519 Non-pressure chronic ulcer of other part of right foot with unspecified severity: Secondary | ICD-10-CM | POA: Diagnosis not present

## 2021-11-21 NOTE — Progress Notes (Signed)
[name], [name] ([id number]) Visit Report for 11/21/2021 Arrival Information Details Patient Name: Date of Service: Kevin BRYSHERE, MCFERREN 11/21/2021 10:30 A M Medical Record Number: [id number] Patient Account Number: [id number] Date of Birth/Sex: Treating RN: Jul 31, 1951 (71 y.o. [name] Primary Care Zeferino Mounts: [name] Other Clinician: Referring Derrika Ruffalo: Treating Amador Braddy/Extender: [name] in Treatment: 304 Visit Information History Since Last Visit Added or deleted any medications: No Patient Arrived: Walker Any new allergies or adverse reactions: No Arrival Time: 10:45 Had a fall or experienced change in No Accompanied By: self activities of daily living that may affect Transfer Assistance: None risk of falls: Patient Identification Verified: Yes Signs or symptoms of abuse/neglect since No Secondary Verification Process Completed: Yes last visito Patient Requires Transmission-Based Precautions: No Hospitalized since last visit: No Patient Has Alerts: Yes Implantable device outside of the clinic No excluding cellular tissue based products placed in the center since last visit: Has Dressing in Place as Prescribed: Yes Has Compression in Place as Prescribed: Yes Has Footwear/Offloading in Place as Yes Prescribed: Left: Surgical Shoe with Pressure Relief Insole Right: Surgical Shoe with Pressure Relief Insole Pain Present Now: No Electronic Signature(s) Signed: 11/21/2021 12:02:35 PM By: Deon Pilling RN, BSN Entered By: Deon Pilling on 11/21/2021 11:37:26 -------------------------------------------------------------------------------- Compression Therapy Details Patient Name: Date of Service: [name], [name] 11/21/2021 10:30 A M Medical Record Number: [id number] Patient Account Number: [id number] Date of Birth/Sex: Treating RN: December 15, 1950 (71 y.o. [name] Primary Care Kashvi Prevette: [name] Other Clinician: Referring  Chanti Golubski: Treating Jese Comella/Extender: [name] in Treatment: 304 Compression Therapy Performed for Wound Assessment: NonWound Condition Lymphedema - Right Leg Performed By: Clinician Deon Pilling, RN Compression Type: Four Layer Electronic Signature(s) Signed: 11/21/2021 12:02:35 PM By: Deon Pilling RN, BSN Entered By: Deon Pilling on 11/21/2021 11:38:40 -------------------------------------------------------------------------------- Compression Therapy Details Patient Name: Date of Service: [name], [name] 11/21/2021 10:30 A M Medical Record Number: [id number] Patient Account Number: [id number] Date of Birth/Sex: Treating RN: 1951/07/30 (71 y.o. [name] Primary Care Jerni Selmer: [name] Other Clinician: Referring Tal Neer: Treating Caitlynn Ju/Extender: [name] in Treatment: 304 Compression Therapy Performed for Wound Assessment: NonWound Condition Lymphedema - Left Leg Performed By: Clinician Deon Pilling, RN Compression Type: Four Layer Electronic Signature(s) Signed: 11/21/2021 12:02:35 PM By: Deon Pilling RN, BSN Entered By: Deon Pilling on 11/21/2021 11:38:58 -------------------------------------------------------------------------------- Encounter Discharge Information Details Patient Name: Date of Service: [name], [name] 11/21/2021 10:30 A M Medical Record Number: [id number] Patient Account Number: [id number] Date of Birth/Sex: Treating RN: [birth year] (71 y.o. [name] Primary Care Kaiesha Tonner: [name] Other Clinician: Referring Rosalynn Sergent: Treating Dock Baccam/Extender: [name] in Treatment: [phone] Encounter Discharge Information Items Discharge Condition: Stable Ambulatory Status: Walker Discharge Destination: Home Transportation: Private Auto Accompanied By: self Schedule Follow-up Appointment: Yes Clinical Summary of Care: Electronic  Signature(s) Signed: 11/21/2021 12:02:35 PM By: Deon Pilling RN, BSN Entered By: Deon Pilling on 11/21/2021 11:40:16 -------------------------------------------------------------------------------- Patient/Caregiver Education Details Patient Name: Date of Service: [name], [name] 1/18/2023andnbsp10:30 [place] Record Number: [id number] Patient Account Number: [id number] Date of Birth/Gender: Treating RN: May 15, 1951 (71 y.o. [name] Primary Care Physician: [name] Other Clinician: Referring Physician: Treating Physician/Extender: [name] in Treatment: [phone] Education Assessment Education Provided To: Patient Education Topics Provided Venous: Handouts: Managing Venous Disease and Related Ulcers Methods: Explain/Verbal Responses: Reinforcements needed Electronic Signature(s) Signed: 11/21/2021 12:02:35 PM By:  Deon Pilling RN, BSN Entered By: Deon Pilling on 11/21/2021 11:40:03 -------------------------------------------------------------------------------- Wound Assessment Details Patient Name: Date of Service: [name], [name] 11/21/2021 10:30 A M Medical Record Number: [id number] Patient Account Number: [id number] Date of Birth/Sex: Treating RN: [birth year] (71 y.o. [name] Primary Care Altheria Shadoan: [name] Other Clinician: Referring Keane Martelli: Treating Sylvi Rybolt/Extender: [name] in Treatment: 304 Wound Status Wound Number: [id number] Primary Etiology: Diabetic Wound/Ulcer of the Lower Extremity Wound Location: Left T Second oe Wound Status: Open Wounding Event: Gradually Appeared Date Acquired: 09/26/2021 Weeks Of Treatment: 8 Clustered Wound: No Wound Measurements Length: (cm) 1.7 Width: (cm) 1 Depth: (cm) 0.1 Area: (cm) 1.335 Volume: (cm) 0.134 % Reduction in Area: 93.7% % Reduction in Volume: 98.4% Wound Description Classification: Grade 2 Exudate Amount:  Medium Exudate Type: Serosanguineous Exudate Color: red, brown Treatment Notes Wound #204 (Toe Second) Wound Laterality: Left Cleanser Peri-Wound Care Topical Primary Dressing KerraCel Ag Gelling Fiber Dressing, 2x2 in (silver alginate) Discharge Instruction: Apply silver alginate to wound bed as instructed Secondary Dressing Woven Gauze Sponges 2x2 in Discharge Instruction: Apply over primary dressing as directed. Secured With [employer] 4x5 (in/yd) Discharge Instruction: Secure with Coban as directed. Compression Wrap Compression Stockings Add-Ons Notes lotion, unna boot first layer to upper portion of lower legs, 4 layer compression wraps to BLE. Electronic Signature(s) Signed: 11/21/2021 12:02:35 PM By: Deon Pilling RN, BSN Entered By: Deon Pilling on 11/21/2021 11:38:11 -------------------------------------------------------------------------------- Wound Assessment Details Patient Name: Date of Service: [name], [name] 11/21/2021 10:30 A M Medical Record Number: [id number] Patient Account Number: [id number] Date of Birth/Sex: Treating RN: November 28, 1950 (71 y.o. [name] Primary Care Mussa Groesbeck: [name] Other Clinician: Referring Kyra Laffey: Treating Rian Busche/Extender: [name] in Treatment: 304 Wound Status Wound Number: [id number] Primary Etiology: Diabetic Wound/Ulcer of the Lower Extremity Wound Location: Left, Dorsal Foot Secondary Etiology: Lymphedema Wounding Event: Blister Wound Status: Open Date Acquired: 11/07/2021 Weeks Of Treatment: 2 Clustered Wound: No Wound Measurements Length: (cm) Width: (cm) Depth: (cm) Area: (cm) Volume: (cm) 0 % Reduction in Area: 100% 0 % Reduction in Volume: 100% 0 0 0 Wound Description Classification: Grade 2 Exudate Amount: Medium Exudate Type: Serosanguineous Exudate Color: red, brown Treatment Notes Wound #210 (Foot) Wound Laterality: Dorsal, Left Cleanser Soap  and Water Discharge Instruction: May shower and wash wound with dial antibacterial soap and water prior to dressing change. Wound Cleanser Discharge Instruction: Cleanse the wound with wound cleanser prior to applying a clean dressing using gauze sponges, not tissue or cotton balls. Peri-Wound Care Zinc Oxide Ointment 30g tube Discharge Instruction: Apply Zinc Oxide to periwound with each dressing change Sween Lotion (Moisturizing lotion) Discharge Instruction: Apply moisturizing lotion as directed Topical Primary Dressing KerraCel Ag Gelling Fiber Dressing, 4x5 in (silver alginate) Discharge Instruction: Apply silver alginate to wound bed as instructed Secondary Dressing ABD Pad, 8x10 Discharge Instruction: Apply over primary dressing as directed. Secured With Compression Wrap FourPress (4 layer compression wrap) Discharge Instruction: Apply four layer compression apply unna boot first layer to upper portion of lower leg. Compression Stockings Add-Ons Notes lotion, unna boot first layer to upper portion of lower legs, 4 layer compression wraps to BLE. Electronic Signature(s) Signed: 11/21/2021 12:02:35 PM By: Deon Pilling RN, BSN Entered By: Deon Pilling on 11/21/2021 11:38:11 -------------------------------------------------------------------------------- Vitals Details Patient Name: Date of Service: [name], [name] 11/21/2021 10:30 A M Medical Record Number: [id number] Patient Account Number: [id number] Date of Birth/Sex: Treating RN: [birth year] (70  y.o. [name] Primary Care Gale Hulse: [name] Other Clinician: Referring Jarrick Fjeld: Treating Leoni Goodness/Extender: [name] in Treatment: 304 Vital Signs Time Taken: 10:45 Temperature (F): 97.9 Height (in): 70 Pulse (bpm): 90 Weight (lbs): 380.2 Respiratory Rate (breaths/min): 22 Body Mass Index (BMI): 54.5 Blood Pressure (mmHg): 176/64 Reference Range: 80 - 120 mg /  dl Notes Per patient has not taken BP medication this morning. Electronic Signature(s) Signed: 11/21/2021 12:02:35 PM By: Deon Pilling RN, BSN Entered By: Deon Pilling on 11/21/2021 11:37:55

## 2021-11-21 NOTE — Patient Outreach (Signed)
Care Management Clinical Social Work Note  11/21/2021 Name: [name] MRN: [id number] DOB: 10-01-1951  [name] is a 71 y.o. year old male who is a primary care patient of McGowen, Maryjean Morn, MD.  The Care Management team was consulted for assistance with chronic disease management and coordination needs.  Engaged with patient by telephone for initial visit in response to provider referral for social work chronic care management and care coordination services  Consent to Services:  Mr. Pinzon was given information about Care Management services today including:  Care Management services includes personalized support from designated clinical staff supervised by his physician, including individualized plan of care and coordination with other care providers 24/7 contact phone numbers for assistance for urgent and routine care needs. The patient may stop case management services at any time by phone call to the office staff.  Patient agreed to services and consent obtained.   Assessment: Review of patient past medical history, allergies, medications, and health status, including review of relevant consultants reports was performed today as part of a comprehensive evaluation and provision of chronic care management and care coordination services.  SDOH (Social Determinants of Health) assessments and interventions performed:  SDOH Interventions    Flowsheet Row Most Recent Value  SDOH Interventions   Food Insecurity Interventions Intervention Not Indicated  Financial Strain Interventions Other (Comment)  [Resources Provided]  Housing Interventions Other (Comment)  [Residing at [facility] until housing is available.]  Intimate Partner Violence Interventions Intervention Not Indicated  Physical Activity Interventions Patient Refused  Stress Interventions Intervention Not Indicated  Social Connections Interventions Intervention Not Indicated  Transportation Interventions  Intervention Not Indicated        Advanced Directives Status: Not ready or willing to discuss.  Care Plan  Allergies  Allergen Reactions   Other Other (See Comments)    Sneezing, coughing   Hydralazine Other (See Comments)    Drowsiness/sedation/mental fog    Outpatient Encounter Medications as of 11/20/2021  Medication Sig   amLODipine (NORVASC) 10 MG tablet Take 1 tablet (10 mg total) by mouth daily.   arginine 500 MG tablet Take by mouth.   b complex vitamins capsule Take 1 capsule by mouth every morning.   butalbital-acetaminophen-caffeine (FIORICET WITH CODEINE) 50-325-40-30 MG capsule TAKE 1 TO 2 CAPSULES BY MOUTH EVERY 6 HOURS AS NEEDED FOR HEADACHES. NOT TO EXCEED 6 CAPSULES PER DAY   cetirizine (ZYRTEC) 10 MG tablet Take 10 mg by mouth daily.   CHROMIUM GTF PO Take by mouth daily.   cloNIDine (CATAPRES) 0.3 MG tablet Take 1 tablet (0.3 mg total) by mouth 3 (three) times daily.   Coenzyme Q10 (CO Q 10) 10 MG CAPS Take by mouth. Reported on 02/19/2016   diltiazem (TIAZAC) 120 MG 24 hr capsule Take 1 capsule (120 mg total) by mouth daily.   furosemide (LASIX) 40 MG tablet TAKE 2 TABLETS BY MOUTH  TWICE DAILY   glucose blood (ONETOUCH ULTRA) test strip CHECK BLOOD SUGAR TWICE  DAILY   Glutamine 500 MG CAPS Take by mouth.   HYDROcodone-acetaminophen (NORCO/VICODIN) 5-325 MG tablet 1-2 tabs po bid prn pain   Insulin Lispro Prot & Lispro (HUMALOG MIX 75/25 KWIKPEN) (75-25) 100 UNIT/ML Kwikpen INJECT SUBCUTANEOUSLY 25  UNITS EVERY MORNING AND 20  UNITS EVERY EVENING AT  SUPPER   Insulin Pen Needle (B-D ULTRAFINE III SHORT PEN) 31G X 8 MM MISC USE 1 PENNEEDLE TWICE DAILY   isosorbide mononitrate (IMDUR) 30 MG 24 hr tablet Take  1 tablet (30 mg total) by mouth daily.   metFORMIN (GLUCOPHAGE) 1000 MG tablet TAKE 1 TABLET BY MOUTH  TWICE DAILY WITH MEALS   Multiple Vitamin (MULTIVITAMIN) tablet Take 1 tablet by mouth daily.   rivaroxaban (XARELTO) 20 MG TABS tablet TAKE 1 TABLET BY  MOUTH ONCE DAILY WITH SUPPER   terazosin (HYTRIN) 10 MG capsule TAKE 1 CAPSULE BY MOUTH  ONCE DAILY AT BEDTIME   valsartan (DIOVAN) 320 MG tablet Take 1 tablet (320 mg total) by mouth daily.   vitamin E 400 UNIT capsule Take 400 Units by mouth every morning. Selenium 50mg    No facility-administered encounter medications on file as of 11/20/2021.    Patient Active Problem List   Diagnosis Date Noted   Uncontrolled type 2 diabetes mellitus with hyperglycemia (HCC) 03/04/2018   Chronic diastolic heart failure (HCC) 12/24/2016   Lymphedema 02/22/2016   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   Chronic headaches 05/10/2015   Equivocal stress test 10/24/2014   Atrial fibrillation (HCC) 08/15/2014   Healthcare maintenance 08/15/2014   Type 2 diabetes mellitus with diabetic neuropathy (HCC) 08/15/2014   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   Recurrent cellulitis of lower extremity 08/02/2013   Lymphedema of both lower extremities 11/04/2008   Morbid obesity due to excess calories (HCC) 12/14/2007   Venous (peripheral) insufficiency 07/22/2007   Diabetic renal disease (HCC) 04/20/2007   DM2 (diabetes mellitus, type 2) (HCC) 08/11/2006   Hypertension, essential 08/11/2006   ALLERGIC RHINITIS 08/11/2006   ASTHMA 08/11/2006   Asthma 08/11/2006    Conditions to be addressed/monitored: HTN, DMII and Homelessness.  Corporate treasurerinancial Constraints, Housing Barriers, and Lacks Knowledge of [employer] Resources.  Care Plan : LCSW Plan of Care  Updates made by Karolee StampsSaporito, Eather Chaires D, LCSW since 11/21/2021 12:00 AM     Problem: Find Affordable and Safe Housing in My Community. Resolved 11/21/2021  Priority: High     Goal: Find Affordable and Safe Housing in My Community. Completed 11/21/2021  Start Date: 11/21/2021  Expected End Date: 11/21/2021  This Visit's Progress: On track  Priority: High  Note:   Current Barriers:   Financial constraints related to inability to afford increase in rent payments  at current place of residence.   Housing barriers and is currently homeless. Lacks knowledge of available community agencies and resources. Clinical Goals:  Patient will work with LCSW and [employer] Care [employer], in an effort to establish permanent, safe, and affordable housing.  Clinical Interventions:  Patient interviewed and appropriate assessments performed. Collaboration with Primary Care Physician, Dr. Nicoletta BaPhilip McGowen regarding development and update of comprehensive plan of care as evidenced by provider attestation and co-signature. Inter-disciplinary care team collaboration (see longitudinal plan of care). Assessment of needs, barriers, agencies contacted, as well as how impacting. Reviewed various housing resources and discussed options with patient. Provided patient with information about all available housing resources in [place], [place], [place], [place], and [place]. Discussed plans with patient for ongoing care management follow-up and provided patient with direct contact information for care management team. Advised patient to begin calling list of housing agencies and resources, provided to him by [employer] Johnson[employer]CSW and [employer] Care Gu[employer].   Other Clinical Interventions: PHQ 2 and PHQ 9 Depression Screen performed and results reviewed with patient, Solution-Focused Strategies implemented, Deep Breathing Exercises, Relaxation Techniques and Mindfulness Meditation Strategies encouraged on a daily basis, Active Listening/Reflection utilized, Emotional Support provided, Problem Solving/Task Centered Skills utilized, Motivational Interviewing performed, Quality of Sleep assessed and Sleep Hygiene Techniques promoted, Increase in Actives/Exercise encouraged,  Verbalization of Feelings encouraged, Crisis Resource Education/Information provided, and Suicidal Ideation/Homicidal Ideation assessed - None Present. Patient Goals/Self-Care Activities: Work with [employer], on a  bi-weekly basis, in an effort to obtain permanent, safe, and affordable housing assistance.   Regret to hear that you are having to move out of your current place of residence, for the past 35 years, due to new ownership and management increasing monthly rent payments. Continue to reside at [facility], until permanent, safe and affordable housing is available.  ~ Approved for housing in [place], [place], just awaiting a move-in date.   Keep list of Affordable Apartments and Rental Properties, as well as list of [address], in [place], [place], [place], [place], and [facility], for future reference.   Contact LCSW directly (# [phone]) if you have questions, need assistance, or if additional social work needs are identified in the near future.    No Follow-Up Required.     Danford Bad, BSW, MSW, LCSW  Licensed Restaurant manager, [employer] [address] [place], [place] [address] Physical Address-[address] [place], [place] [address] Toll Free Main # [phone] Fax # [phone] Cell # [phone]  [email or web address]

## 2021-11-22 ENCOUNTER — Ambulatory Visit (HOSPITAL_COMMUNITY): Payer: Medicare Other | Admitting: Licensed Clinical Social Worker

## 2021-11-23 ENCOUNTER — Ambulatory Visit: Payer: Medicare Other | Admitting: Internal Medicine

## 2021-11-27 NOTE — Telephone Encounter (Signed)
Left message to return my call when he obtained housing for a [place] referral

## 2021-11-28 ENCOUNTER — Other Ambulatory Visit: Payer: Self-pay

## 2021-11-28 ENCOUNTER — Encounter (HOSPITAL_BASED_OUTPATIENT_CLINIC_OR_DEPARTMENT_OTHER): Payer: Medicare Other | Admitting: Physician Assistant

## 2021-11-28 DIAGNOSIS — E1151 Type 2 diabetes mellitus with diabetic peripheral angiopathy without gangrene: Secondary | ICD-10-CM | POA: Diagnosis not present

## 2021-11-28 DIAGNOSIS — L03116 Cellulitis of left lower limb: Secondary | ICD-10-CM | POA: Diagnosis not present

## 2021-11-28 DIAGNOSIS — E1140 Type 2 diabetes mellitus with diabetic neuropathy, unspecified: Secondary | ICD-10-CM | POA: Diagnosis not present

## 2021-11-28 DIAGNOSIS — L97812 Non-pressure chronic ulcer of other part of right lower leg with fat layer exposed: Secondary | ICD-10-CM | POA: Diagnosis not present

## 2021-11-28 DIAGNOSIS — L03115 Cellulitis of right lower limb: Secondary | ICD-10-CM | POA: Diagnosis not present

## 2021-11-28 DIAGNOSIS — L97522 Non-pressure chronic ulcer of other part of left foot with fat layer exposed: Secondary | ICD-10-CM | POA: Diagnosis not present

## 2021-11-28 DIAGNOSIS — I87333 Chronic venous hypertension (idiopathic) with ulcer and inflammation of bilateral lower extremity: Secondary | ICD-10-CM | POA: Diagnosis not present

## 2021-11-28 DIAGNOSIS — I87312 Chronic venous hypertension (idiopathic) with ulcer of left lower extremity: Secondary | ICD-10-CM | POA: Diagnosis not present

## 2021-11-28 DIAGNOSIS — E11622 Type 2 diabetes mellitus with other skin ulcer: Secondary | ICD-10-CM | POA: Diagnosis not present

## 2021-11-28 DIAGNOSIS — I890 Lymphedema, not elsewhere classified: Secondary | ICD-10-CM | POA: Diagnosis not present

## 2021-11-28 DIAGNOSIS — L97521 Non-pressure chronic ulcer of other part of left foot limited to breakdown of skin: Secondary | ICD-10-CM | POA: Diagnosis not present

## 2021-11-28 DIAGNOSIS — L97519 Non-pressure chronic ulcer of other part of right foot with unspecified severity: Secondary | ICD-10-CM | POA: Diagnosis not present

## 2021-11-28 NOTE — Progress Notes (Addendum)
ARTH, [name] ([id number]) Visit Report for 11/28/2021 Chief Complaint Document Details Patient Name: Date of Service: [name], GALICIA 11/28/2021 10:30 A M Medical Record Number: [id number] Patient Account Number: [id number] Date of Birth/Sex: Treating RN: 1951-05-01 (71 y.o. [name] Primary Care Provider: Nicoletta Ba Other Clinician: Referring Provider: Treating Provider/Extender: Adele Dan in Treatment: [phone] Information Obtained from: Patient Chief Complaint Patient is here for evaluation of venous/lymphedema ulcers Electronic Signature(s) Signed: 11/28/2021 10:53:59 AM By: Lenda Kelp PA-C Entered By: Lenda Kelp on 11/28/2021 10:53:59 -------------------------------------------------------------------------------- HPI Details Patient Name: Date of Service: [name], [name] 11/28/2021 10:30 A M Medical Record Number: [id number] Patient Account Number: [id number] Date of Birth/Sex: Treating RN: [birth year] (71 y.o. [name] Primary Care Provider: Nicoletta Ba Other Clinician: Referring Provider: Treating Provider/Extender: Adele Dan in Treatment: 305 History of Present Illness HPI Description: Referred by PCP for consultation. Patient has long standing history of BLE venous stasis, no prior ulcerations. At beginning of month, developed cellulitis and weeping. Received IM Rocephin followed by Keflex and resolved. Wears compression stocking, appr 6 months old. Not sure strength. No present drainage. 01/22/16 this is a patient who is a type II diabetic on insulin. 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 He tells me he religiously wears pressure stockings of uncertain strength. He was here with weeping edema about 8 months ago but did not have an open wound. Roughly a month ago he had a reopening on his bilateral legs. He is been using bandages and Neosporin. He  does not complain of pain. 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 Last BUN/creatinine I have is from 11/20/15 at 13 and 1.0 respectively 01/29/16; patient arrives today having tolerated the Profore wrap. He brought in his stockings and these are 18 mmHg stockings he bought from [employer]. The compression here is likely inadequate. He does not complain of pain or excessive drainage she has no systemic symptoms. The wound on the right looks improved as does the one on the left although one on the left is more substantial with still tissue at risk below the actual wound area on the bilateral posterior calf 02/05/16; patient arrives with poor edema control. He states that we did put a 4 layer compression on it last week. No weight appear 5 this. 02/12/16; the area on the posterior right Has healed. The left Has a substantial wound that has necrotic surface eschar that requires a debridement with a curette. 02/16/16;the patient called or a Nurse visit secondary to increased swelling. He had been in earlier in the week with his right leg healed. He was transitioned to is on pressure stocking on the right leg with the only open wound on the left, a substantial area on the left posterior calf. Note he has a history of severe lower extremity edema, he has a history of chronic atrial fibrillation but not heart failure per my notes but I'll need to research this. He is not complaining of chest pain shortness of breath or orthopnea. The intake nurse noted blisters on the previously closed right leg 02/19/16; this is the patient's regular visit day. I see him on Friday with escalating edema new wounds on the right leg and clear signs of at least right ventricular heart failure. I increased his Lasix to 40 twice a  day. He is returning currently in follow-up. States he is noticed a decrease in that the  edema 02/26/16 patient's legs have much less edema. There is nothing really open on the right leg. The left leg has improved condition of the large superficial wound on the posterior left leg 03/04/16; edema control is very much better. The patient's right leg wounds have healed. On the left leg he continues to have severe venous inflammation on the posterior aspect of the left leg. There is no tenderness and I don't think any of this is cellulitis. 03/11/16; patient's right leg is married healed and he is in his own stocking. The patient's left leg has deteriorated somewhat. There is a lot of erythema around the wound on the posterior left leg. There is also a significant rim of erythema posteriorly just above where the wrap would've ended there is a new wound in this location and a lot of tenderness. Can't rule out cellulitis in this area. 03/15/16; patient's right leg remains healed and he is in his own stocking. The patient's left leg is much better than last review. His major wound on the posterior aspect of his left Is almost fully epithelialized. He has 3 small injuries from the wraps. Really. Erythema seems a lot better on antibiotics 03/18/16; right leg remains healed and he is in his own stocking. The patient's left leg is much better. The area on the posterior aspect of the left calf is fully epithelialized. His 3 small injuries which were wrap injuries on the left are improved only one seems still open his erythema has resolved 03/25/16; patient's right leg remains healed and he is in his own stocking. There is no open area today on the left leg posterior leg is completely closed up. His wrap injuries at the superior aspect of his leg are also resolved. He looks as though he has some irritation on the dorsal ankle but this is fully epithelialized without evidence of infection. 03/28/16; we discharged this patient on Monday. Transitioned him into his own stocking. There were problems almost  immediately with uncontrolled swelling weeping edema multiple some of which have opened. He does not feel systemically unwell in particular no chest pain no shortness of breath and he does not feel 04/08/16; the edema is under better control with the Profore light wrap but he still has pitting edema. There is one large wound anteriorly 2 on the medial aspect of his left leg and 3 small areas on the superior posterior calf. Drainage is not excessive he is tolerating a Profore light well 04/15/16; put a Profore wrap on him last week. This is controlled is edema however he had a lot of pain on his left anterior foot most of his wounds are healed 04/22/16 once again the patient has denuded areas on the left anterior foot which he states are because his wrap slips up word. He saw his primary physician today is on Lasix 40 twice a day and states that he his weight is down 20 pounds over the last 3 months. 04/29/16: Much improved. left anterior foot much improved. He is now on Lasix 80 mg per day. Much improved edema control 05/06/16; I was hoping to be able to discharge him today however once again he has blisters at a low level of where the compression was placed last week mostly on his left lateral but also his left medial leg and a small area on the anterior part of the left foot. 05/09/16; apparently the patient  went home after his appointment on 7/4 later in the evening developing pain in his upper medial thigh together with subjective fever and chills although his temperature was not taken. The pain was so intense he felt he would probably have to call 911. However he then remembered that he had leftover doxycycline from a previous round of antibiotics and took these. By the next morning he felt a lot better. He called and spoke to one of our nurses and I approved doxycycline over the phone thinking that this was in relation to the wounds we had previously seen although they were definitely were not. The  patient feels a lot better old fever no chills he is still working. Blood sugars are reasonably controlled 05/13/16; patient is back in for review of his cellulitis on his anterior medial upper thigh. He is taking doxycycline this is a lot better. Culture I did of the nodular area on the dorsal aspect of his foot grew MRSA this also looks a lot better. 05/20/16; the patient is cellulitis on the medial upper thigh has resolved. All of his wound areas including the left anterior foot, areas on the medial aspect of the left calf and the lateral aspect of the calf at all resolved. He has a new blister on the left dorsal foot at the level of the fourth toe this was excised. No evidence of infection 05/27/16; patient continues to complain weeping edema. He has new blisterlike wounds on the left anterior lateral and posterior lateral calf at the top of his wrap levels. The area on his left anterior foot appears better. He is not complaining of fever, pain or pruritus in his feet. 05/30/16; the patient's blisters on his left anterior leg posterior calf all look improved. He did not increase the Lasix 100 mg as I suggested because he was going to run out of his 40 mg tablets. He is still having weeping edema of his toes 06/03/16; I renewed his Lasix at 80 mg once a day as he was about to run out when I last saw him. He is on 80 mg of Lasix now. I have asked him to cut down on the excessive amount of water he was drinking and asked him to drink according to his thirst mechanisms 06/12/2016 -- was seen 2 days ago and was supposed to wear his compression stockings at home but he is developed lymphedema and superficial blisters on the left lower extremity and hence came in for a review 06/24/16; the remaining wound is on his left anterior leg. He still has edema coming from between his toes. There is lymphedema here however his edema is generally better than when I last saw this. He has a history of atrial fibrillation  but does not have a known history of congestive heart failure nevertheless I think he probably has this at least on a diastolic basis. 07/01/16 I reviewed his echocardiogram from January 2017. This was essentially normal. He did not have LVH, EF of 55-60%. His right ventricular function was normal although he did have trivial tricuspid and pulmonic regurgitation. This is not audible on exam however. I increased his Lasix to do massive edema in his legs well above his knees I think in early July. He was also drinking an excessive amount of water at the time. 07/15/16; missed his appointment last week because of the Labor Day holiday on Monday. He could not get another appointment later in the week. Started to feel the wrap digging in superiorly  so we remove the top half and the bottom half of his wrap. He has extensive erythema and blistering superiorly in the left leg. Very tender. Very swollen. Edema in his foot with leaking edema fluid. He has not been systemically unwell 07/22/16; the area on the left leg laterally required some debridement. The medial wounds look more stable. His wrap injury wounds appear to have healed. Edema and his foot is better, weeping edema is also better. He tells me he is meeting with the supplier of the external compression pumps at work 08/05/16; the patient was on vacation last week in [facility] His wrap is been on for an extended period of time. Also over the weekend he developed an extensive area of tender erythema across his anterior medial thigh. He took to doxycycline yesterday that he had leftover from a previous prescription. The patient complains of weeping edema coming out of his toes 08/08/16; I saw this patient on 10/2. He was tender across his anterior thigh. I put him on doxycycline. He returns today in follow-up. He does not have any open wounds on his lower leg, he still has edema weeping into his toes. 08/12/16; patient was seen back urgently today to  follow-up for his extensive left thigh cellulitis/erysipelas. He comes back with a lot less swelling and erythema pain is much better. I believe I gave him Augmentin and Cipro. His wrap was cut down as he stated a roll down his legs. He developed blistering above the level of the wrap that remained. He has 2 open blisters and 1 intact. 08/19/16; patient is been doing his primary doctor who is increased his Lasix from 40-80 once a day or 80 already has less edema. Cellulitis has remained improved in the left thigh. 2 open areas on the posterior left calf 08/26/16; he returns today having new open blisters on the anterior part of his left leg. He has his compression pumps but is not yet been shown how to use some vital representative from the supplier. 09/02/16 patient returns today with no open wounds on the left leg. Some maceration in his plantar toes 09/10/2016 -- Dr. Leanord Hawking had recently discharged him on 09/02/2016 and he has come right back with redness swelling and some open ulcers on his left lower extremity. He says this was caused by trying to apply his compression stockings and he's been unable to use this and has not been able to use his lymphedema pumps. He had some doxycycline leftover and he has started on this a few days ago. 09/16/16; there are no open wounds on his leg on the left and no evidence of cellulitis. 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 He does not complain of symptoms here. I am not clear use using his external compression pumps. 09/23/16; I have not seen this patient in 2 weeks. He canceled his appointment 10 days ago as he was going on vacation. He tells me that on Monday he noticed a large area on his posterior left leg which is been draining copiously and is reopened into a large wound. He is been using ABDs and the external part of his juxtalite, according to our nurse this was not on properly. 10/07/16;  Still a substantial area on the posterior left leg. Using silver alginate 10/14/16; in general better although there is still open area which looks healthy. Still using silver alginate. He reminds me that this happen before he left for [facility]  [place]. T oday while he was showering in the morning. He had been using his juxtalite's 10/21/16; the area on his posterior left leg is fully epithelialized. However he arrives today with a large area of tender erythema in his medial and posterior left thigh just above the knee. I have marked the area. Once again he is reluctant to consider hospitalization. I treated him with oral antibiotics in the past for a similar situation with resolution I think with doxycycline however this area it seems more extensive to me. He is not complaining of fever but does have chills and says states he is thirsty. His blood sugar today was in the 140s at home 10/25/16 the area on his posterior left leg is fully epithelialized although there is still some weeping edema. The large area of tenderness and erythema in his medial and posterior left thigh is a lot less tender although there is still a lot of swelling in this thigh. He states he feels a lot better. He is on doxycycline and Augmentin that I started last week. This will continued until Tuesday, December 26. I have ordered a duplex ultrasound of the left thigh rule out DVT whether there is an abscess something that would need to be drained I would also like to know. 11/01/16; he still has weeping edema from a not fully epithelialized area on his left posterior calf. Most of the rest of this looks a lot better. He has completed his antibiotics. His thigh is a lot better. Duplex ultrasound did not show a DVT in the thigh 11/08/16; he comes in today with more Denuded surface epithelium from the posterior aspect of his calf. There is no real evidence of cellulitis. The superior aspect of his wrap appears to have put quite an  indentation in his leg just below the knee and this may have contributed. He does not complain of pain or fever. We have been using silver alginate as the primary dressing. The area of cellulitis in the right thigh has totally resolved. He has been using his compression stockings once a week 11/15/16; the patient arrives today with more loss of epithelium from the posterior aspect of his left calf. He now has a fairly substantial wound in this area. The reason behind this deterioration isn't exactly clear although his edema is not well controlled. He states he feels he is generally more swollen systemically. He is not complaining of chest pain shortness of breath fever. T me he has an appointment with his primary physician in early February. He is on 80 mg of oral ells Lasix a day. He claims compliance with the external compression pumps. He is not having any pain in his legs similar to what he has with his recurrent cellulitis 11/22/16; the patient arrives a follow-up of his large area on his left lateral calf. This looks somewhat better today. He came in earlier in the week for a dressing change since I saw him a week ago. He is not complaining of any pain no shortness of breath no chest pain 11/28/16; the patient arrives for follow-up of his large area on the left lateral calf this does not look better. In fact it is larger weeping edema. The surface of the wound does not look too bad. We have been using silver alginate although I'm not certain that this is a dressing issue. 12/05/16; again the patient follows up for a large wound on the left lateral and left posterior calf this does not look better. There  continues to be weeping edema necrotic surface tissue. More worrisome than this once again there is erythema below the wound involving the distal Achilles and heel suggestive of cellulitis. He is on his feet working most of the day of this is not going well. We are changing his dressing twice a week to  facilitate the drainage. 12/12/16; not much change in the overall dimensions of the large area on the left posterior calf. This is very inflamed looking. I gave him an. Doxycycline last week does not really seem to have helped. He found the wrap very painful indeed it seems to of dog into his legs superiorly and perhaps around the heel. He came in early today because the drainage had soaked through his dressings. 12/19/16- patient arrives for follow-up evaluation of his left lower extremity ulcers. He states that he is using his lymphedema pumps once daily when there is "no drainage". He admits to not using his lipedema pumps while under current treatment. His blood sugars have been consistently between 150-200. 12/26/16; the patient is not using his compression pumps at home because of the wetness on his feet. I've advised him that I think it's important for him to use this daily. He finds his feet too wet, he can put a plastic bag over his legs while he is in the pumps. Otherwise I think will be in a vicious circle. We are using silver alginate to the major area on his left posterior calf 01/02/17; the patient's posterior left leg has further of all into 3 open wounds. All of them covered with a necrotic surface. He claims to be using his compression pumps once a day. His edema control is marginal. Continue with silver alginate 01/10/17; the patient's left posterior leg actually looks somewhat better. There is less edema, less erythema. Still has 3 open areas covered with a necrotic surface requiring debridement. He claims to be using his compression pumps once a day his edema control is better 01/17/17; the patient's left posterior calf look better last week when I saw him and his wrap was changed 2 days ago. He has noted increasing pain in the left heel and arrives today with much larger wounds extensive erythema extending down into the entire heel area especially tender medially. He is not  systemically unwell CBGs have been controlled no fever. Our intake nurse showed me limegreen drainage on his AVD pads. 01/24/17; his usual this patient responds nicely to antibiotics last week giving him Levaquin for presumed Pseudomonas. The whole entire posterior part of his leg is much better much less inflamed and in the case of his Achilles heel area much less tender. He has also had some epithelialization posteriorly there are still open areas here and still draining but overall considerably better 01/31/17- He has continue to tolerate the compression wraps. he states that he continues to use the lymphedema pumps daily, and can increase to twice daily on the weekends. He is voicing no complaints or concerns regarding his LLE ulcers 02/07/17-he is here for follow-up evaluation. He states that he noted some erythema to the left medial and anterior thigh, which he states is new as of yesterday. He is concerned about recurrent cellulitis. He states his blood sugars have been slightly elevated, this morning in the 180s 02/14/17; he is here for follow-up evaluation. When he was last here there was erythema superiorly from his posterior wound in his anterior thigh. He was prescribed Levaquin however a culture of the wound surface grew MRSA over  the phone I changed him to doxycycline on Monday and things seem to be a lot better. 02/24/17; patient missed his appointment on Friday therefore we changed his nurse visit into a physician visit today. Still using silver alginate on the large area of the posterior left thigh. He isn't new area on the dorsal left second toe 03/03/17; actually better today although he admits he has not used his external compression pumps in the last 2 days or so because of work responsibilities over the weekend. 03/10/17; continued improvement. External compression pumps once a day almost all of his wounds have closed on the posterior left calf. Better edema control 03/17/17; in general  improved. He still has 3 small open areas on the lateral aspect of his left leg however most of the area on the posterior part of his leg is epithelialized. He has better edema control. He has an ABD pad under his stocking on the right anterior lower leg although he did not let us look at that today. 03/24/17; patient arrives back in clinic today with no open areas however there are areas on the posterior left calf and anterior left calf that are less than 100% epithelialized. His edema is well controlled in the left lower leg. There is some pitting edema probably lymphedema in the left upper thigh. He uses compression pumps at home once per day. I tried to get him to do this twice a day although he is very reticent. 04/01/2017 -- for the last 2 days he's had significant redness, tenderness and weeping and came in for an urgent visit today. 04/07/17; patient still has 6 more days of doxycycline. He was seen by Dr. Meyer Russel last Wednesday for cellulitis involving the posterior aspect, lateral aspect of his Involving his heel. For the most part he is better there is less erythema and less weeping. He has been on his feet for 12 hours 2 over the weekend. Using his compression pumps once a day 04/14/17 arrives today with continued improvement. Only one area on the posterior left calf that is not fully epithelialized. He has intense bilateral venous inflammation associated with his chronic venous insufficiency disease and secondary lymphedema. We have been using silver alginate to the left posterior calf wound In passing he tells [place] today that the right leg but we have not seen in quite some time has an open area on it but he doesn't want [place] to look at this today states he will show this to [place] next week. 04/21/17; there is no open area on his left leg although he still reports some weeping edema. He showed [place] his right leg today which is the first time we've seen this leg in a long time. He has a large area of  open wound on the right leg anteriorly healthy granulation. Quite a bit of swelling in the right leg and some degree of venous inflammation. He told us about the right leg in passing last week but states that deterioration in the right leg really only happened over the weekend 04/28/17; there is no open area on the left leg although there is an irritated part on the posterior which is like a wrap injury. The wound on the right leg which was new from last week at least to [place] is a lot better. 05/05/17; still no open area on the left leg. Patient is using his new compression stocking which seems to be doing a good job of controlling the edema. He states he is using his  compression pumps once per day. The right leg still has an open wound although it is better in terms of surface area. Required debridement. A lot of pain in the posterior right Achilles marked tenderness. Usually this type of presentation this patient gives concern for an active cellulitis 05/12/17; patient arrives today with his major wound from last week on the right lateral leg somewhat better. Still requiring debridement. He was using his compression stocking on the left leg however that is reopened with superficial wounds anteriorly he did not have an open wound on this leg previously. He is still using his juxta light's once daily at night. He cannot find the time to do this in the morning as he has to be at work by 7 AM 05/19/17; right lateral leg wound looks improved. No debridement required. The concerning area is on the left posterior leg which appears to almost have a subcutaneous hemorrhagic component to it. We've been using silver alginate to all the wounds 05/26/17; the right lateral leg wound continues to look improved. However the area on the left posterior calf is a tightly adherent surface. [place] using silver alginate. Because of the weeping edema in his legs there is very little good alternatives. 06/02/17; the patient left  here last week looking quite good. Major wound on the left posterior calf and a small one on the right lateral calf. Both of these look satisfactory. He tells me that by Wednesday he had noted increased pain in the left leg and drainage. He called on Thursday and Friday to get an appointment here but we were blocked. He did not go to urgent care or his primary physician. He thinks he had a fever on Thursday but did not actually take his temperature. He has not been using his compression pumps on the left leg because of pain. I advised him to go to the emergency room today for IV antibiotics for stents of left leg cellulitis but he has refused I have asked him to take 2 days off work to keep his leg elevated and he has refused this as well. In view of this I'm going to call him and Augmentin and doxycycline. 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 However after a detailed discussion regarding the risk benefits and alternatives he refuses to get admitted to the hospital. With no other recourse I will continue him on oral antibiotics as before and hopefully he'll have his infectious disease consultation this week. 06/16/2017 -- the patient was seen today by the nurse practitioner at infectious disease Ms. Dixon. 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 He has also been advise Lotrimin cream locally. she also had wise IV antibiotics for his condition if it flares up 06/23/17; patient arrives today with drainage bilaterally although the remaining wound on the left posterior calf after  cleaning up today "highlighter yellow drainage" did not look too bad. Unfortunately he has had breakdown on the right anterior leg [previously this leg had not been open and he is using a black stocking] he went to see infectious  disease and is been put on clindamycin 150 mg daily, I did not verify the dose although I'm not familiar with using clindamycin in this dosing range, perhaps for prophylaxisoo 06/27/17; I brought this patient back today to follow-up on the wound deterioration on the right lower leg together with surrounding cellulitis. I started him on doxycycline 4 days ago. This area looks better however he comes in today with intense cellulitis on the medial part of his left thigh. This is not have a wound in this area. Extremely tender. We've been using silver alginate to the wounds on the right lower leg left lower leg with bilateral 4 layer compression he is using his external compression pumps once a day 07/04/17; patient's left medial thigh cellulitis looks better. He has not been using his compression pumps as his insert said it was contraindicated with cellulitis. His right leg continues to make improvements all the wounds are still open. We only have one remaining wound on the left posterior calf. Using silver alginate to all open areas. He is on doxycycline which I started a week ago and should be finishing I gave him Augmentin after Thursday's visit for the severe cellulitis on the left medial thigh which fortunately looks better 07/14/17; the patient's left medial thigh cellulitis has resolved. The cellulitis in his right lower calf on the right also looks better. All of his wounds are stable to improved we've been using silver alginate he has completed the antibiotics I have given him. He has clindamycin 150 mg once a day prescribed by infectious disease for prophylaxis, I've advised him to start this now. We have been using bilateral Unna boots over silver alginate to the wound  areas 07/21/17; the patient is been to see infectious disease who noted his recurrent problems with cellulitis. He was not able to tolerate prophylactic clindamycin therefore he is on amoxicillin 500 twice a day. He also had a second daily dose of Lasix added By Dr. Oneta Rack but he is not taking this. Nor is he being completely compliant with his compression pumps a especially not this week. He has 2 remaining wounds one on the right posterior lateral lower leg and one on the left posterior medial lower leg. 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 The patient has Unna boots bilaterally. 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 He has bilateral Unna boots for compression and silver alginate to his wounds. Arrives today with his legs looking as good as I have seen him in quite some time. Not surprisingly his wounds look better as well with improvement on the right lateral leg venous insufficiency wound and also the left medial leg. He is still using the compression pumps once a day 08/11/17; both legs appear to be doing better wounds on the right lateral and left medial legs look better. Skin on the right leg quite good. He is been using silver alginate as the primary dressing. I'm going to use Anasept gel calcium alginate and maintain all the secondary dressings 08/18/17; the patient continues to actually do quite well. The area on his right lateral leg is just about closed the left medial also looks better although it is still moist in this area. His edema is well controlled we have been using Anasept gel with calcium alginate  and the usual secondary dressings, 4 layer compression and once daily use of his compression pumps "always been able to manage 09/01/17; the patient  continues to do reasonably well in spite of his trip to [place]. The area on the right lateral leg is epithelialized. Left is much better but still open. He has more edema and more chronic erythema on the left leg [venous inflammation] 09/08/17; he arrives today with no open wound on the right lateral leg and decently controlled edema. Unfortunately his left leg is not nearly as in his good situation as last week.he apparently had increasing edema starting on Saturday. He edema soaked through into his foot so used a plastic bag to walk around his home. The area on the medial right leg which was his open area is about the same however he has lost surface epithelium on the left lateral which is new and he has significant pain in the Achilles area of the left foot. He is already on amoxicillin chronically for prophylaxis of cellulitis in the left leg 09/15/17; he is completed a week of doxycycline and the cellulitis in the left posterior leg and Achilles area is as usual improved. He still has a lot of edema and fluid soaking through his dressings. There is no open wound on the right leg. He saw infectious disease NP today 09/22/17;As usual 1 we transition him from our compression wraps to his stockings things did not go well. He has several small open areas on the right leg. He states this was caused by the compression wrap on his skin although he did not wear this with the stockings over them. He has several superficial areas on the left leg medially laterally posteriorly. He does not have any evidence of active cellulitis especially involving the left Achilles The patient is traveling from [facility] Saturday going to [facility]. He states he isn't attempting to get an appointment with a heel objects wound center there to change his dressings. I am not completely certain whether this will work 10/06/17; the patient came in on Friday for a nurse visit and the nurse reported that his legs actually look  quite good. He arrives in clinic today for his regular follow-up visit. He has a new wound on his left third toe over the PIP probably caused by friction with his footwear. He has small areas on the left leg and a very superficial but epithelialized area on the right anterior lateral lower leg. Other than that his legs look as good as I've seen him in quite some time. We have been using silver alginate Review of systems; no chest pain no shortness of breath other than this a 10 point review of systems negative 10/20/17; seen by Dr. Meyer Russel last week. He had taken some antibiotics [doxycycline] that he had left over. Dr. Meyer Russel thought he had candida infection and declined to give him further antibiotics. He has a small wound remaining on the right lateral leg several areas on the left leg including a larger area on the left posterior several left medial and anterior and a small wound on the left lateral. The area on the left dorsal third toe looks a lot better. ROS; Gen.; no fever, respiratory no cough no sputum Cardiac no chest pain other than this 10 point review of system is negative 10/30/17; patient arrives today having fallen in the bathtub 3 days ago. It took him a while to get up. He has pain and maceration in the wounds on his  left leg which have deteriorated. He has not been using his pumps he also has some maceration on the right lateral leg. 11/03/17; patient continues to have weeping edema especially in the left leg. This saturates his dressings which were just put on on 12/27. As usual the doxycycline seems to take care of the cellulitis on his lower leg. He is not complaining of fever, chills, or other systemic symptoms. He states his leg feels a lot better on the doxycycline I gave him empirically. He also apparently gets injections at his primary doctor's officeo Rocephin for cellulitis prophylaxis. I didn't ask him about his compression pump compliance today I think that's probably  marginal. Arrives in the clinic with all of his dressings primary and secondary macerated full of fluid and he has bilateral edema 11/10/17; the patient's right leg looks some better although there is still a cluster of wounds on the right lateral. The left leg is inflamed with almost circumferential skin loss medially to laterally although we are still maintaining anteriorly. He does not have overt cellulitis there is a lot of drainage. He is not using compression pumps. We have been using silver alginate to the wound areas, there are not a lot of options here 11/17/17; the patient's right leg continues to be stable although there is still open wounds, better than last week. The inflammation in the left leg is better. Still loss of surface layer epithelium especially posteriorly. There is no overt cellulitis in the amount of edema and his left leg is really quite good, tells me he is using his compression pumps once a day. 11/24/17; patient's right leg has a small superficial wound laterally this continues to improve. The inflammation in the left leg is still improving however we have continuous surface layer epithelial loss posteriorly. There is no overt cellulitis in the amount of edema in both legs is really quite good. He states he is using his compression pumps on the left leg once a day for 5 out of 7 days 12/01/17; very small superficial areas on the right lateral leg continue to improve. Edema control in both legs is better today. He has continued loss of surface epithelialization and left posterior calf although I think this is better. We have been using silver alginate with large number of absorptive secondary dressings 4 layer on the left Unna boot on the right at his request. He tells me he is using his compression pumps once a day 12/08/17; he has no open area on the right leg is edema control is good here. On the left leg however he has marked erythema and tenderness breakdown of skin. He has  what appears to be a wrap injury just distal to the popliteal fossa. 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 There was swelling erythema and breakdown in the right leg. He has much worse skin breakdown in the left leg as well multiple open areas medially and posteriorly as well as laterally. He tells me he has been using his compression pumps but tells me he feels that the drainage out of his leg is worse when he uses a compression pumps. T be fair to him he is been saying this o for a while however I  don't know that I have really been listening to this. I wonder if the compression pumps are working properly 12/22/17;. Once again he arrives with severe erythema, weeping edema from the left greater than right leg. Noncompliance with compression pumps. New this visit he is complaining of pain on the lateral aspect of the right leg and the medial aspect of his right thigh. He apparently saw his cardiologist Dr. [name] who was ordered an echocardiogram area and I think this is a step in the right direction 12/25/17; started his doxycycline Monday night. There is still intense erythema of the right leg especially in the anterior thigh although there is less tenderness. The erythema around the wound on the right lateral calf also is less tender. He still complaining of pain in the left heel. His wounds are about the same right lateral left medial left lateral. Superficial but certainly not close to closure. He denies being systemically unwell no fever chills no abdominal pain no diarrhea 12/29/17; back in follow-up of his extensive right calf and right thigh cellulitis. I added amoxicillin to cover possible doxycycline resistant  strep. This seems to of done the trick he is in much less pain there is much less erythema and swelling. He has his echocardiogram at 11:00 this morning. X-ray of the left heel was also negative. 01/05/18; the patient arrived with his edema under much better control. Now that he is retired he is able to use his compression pumps daily and sometimes twice a day per the patient. He has a wound on the right leg the lateral wound looks better. Area on the left leg also looks a lot better. He has no evidence of cellulitis in his bilateral thighs I had a quick peak at his echocardiogram. He is in normal ejection fraction and normal left ventricular function. He has moderate pulmonary hypertension moderately reduced right ventricular function. One would have to wonder about chronic sleep apnea although he says he doesn't snore. He'll review the echocardiogram with his cardiologist. 01/12/18; the patient arrives with the edema in both legs under exemplary control. He is using his compression pumps daily and sometimes twice daily. His wound on the right lateral leg is just about closed. He still has some weeping areas on the posterior left calf and lateral left calf although everything is just about closed here as well. I have spoken with Aldean Baker who is the patient's nurse practitioner and infectious disease. She was concerned that the patient had not understood that the parenteral penicillin injections he was receiving for cellulitis prophylaxis was actually benefiting him. I don't think the patient actually saw that I would tend to agree we were certainly dealing with less infections although he had a serious one last month. 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 He is healed. He'll be placed in juxtalite compression wraps and increase his lymphedema pumps to twice daily. We will follow up again next week to ensure there are no issues with the new  regiment. 01/20/18-he is here for evaluation of bilateral lower extremity weeping edema. Yesterday he was placed in compression wrap to the right lower extremity and compression stocking to left lower shrubbery. He states he uses lymphedema pumps last night and again this morning and noted a blister to the left lower extremity. On exam he was noted to have drainage to the right lower extremity. He will be placed in Unna boots bilaterally and follow-up next week 01/26/18; patient was actually discharged a  week ago to his own juxta light stockings only to return the next day with bilateral lower extremity weeping edema.he was placed in bilateral Unna boots. He arrives today with pain in the back of his left leg. There is no open area on the right leg however there is a linear/wrap injury on the left leg and weeping edema on the left leg posteriorly. I spoke with infectious disease about 10 days ago. They were disappointed that the patient elected to discontinue prophylactic intramuscular penicillin shots as they felt it was particularly beneficial in reducing the frequency of his cellulitis. I discussed this with the patient today. He does not share this view. He'll definitely need antibiotics today. Finally he is traveling to [place] and trauma leaving this Saturday and returning a week later and he does not travel with his pumps. He is going by car 01/30/18; patient was seen 4 days ago and brought back in today for review of cellulitis in the left leg posteriorly. I put him on amoxicillin this really hasn't helped as much as I might like. He is also worried because he is traveling to [facility] trauma by car. Finally we will be rewrapping him. There is no open area on the right leg over his left leg has multiple weeping areas as usual 02/09/18; The same wrap on for 10 days. He did not pick up the last doxycycline I prescribed for him. He apparently took 4 days worth he already had. There is nothing open on  his right leg and the edema control is really quite good. He's had damage in the left leg medially and laterally especially probably related to the prolonged use of Unna boots 02/12/18; the patient arrived in clinic today for a nurse visit/wrap change. He complained of a lot of pain in the left posterior calf. He is taking doxycycline that I previously prescribed for him. Unfortunately even though he used his stockings and apparently used to compression pumps twice a day he has weeping edema coming out of the lateral part of his right leg. This is coming from the lower anterior lateral skin area. 02/16/18; the patient has finished his doxycycline and will finish the amoxicillin 2 days. The area of cellulitis in the left calf posteriorly has resolved. He is no longer having any pain. He tells me he is using his compression pumps at least once a day sometimes twice. 02/23/18; the patient finished his doxycycline and Amoxil last week. On Friday he noticed a small erythematous circle about the size of a quarter on the left lower leg just above his ankle. This rapidly expanded and he now has erythema on the lateral and posterior part of the thigh. This is bright red. Also has an area on the dorsal foot just above his toes and a tender area just below the left popliteal fossa. He came off his prophylactic penicillin injections at his own insistence one or 2 months ago. This is obviously deteriorated since then 03/02/18; patient is on doxycycline and Amoxil. Culture I did last week of the weeping area on the back of his left calf grew group B strep. I have therefore renewed the amoxicillin 500 3 times a day for a further week. He has not been systemically unwell. Still complaining of an area of discomfort right under his left popliteal fossa. There is no open wound on the right leg. He tells me that he is using his pumps twice a day on most days 03/09/18; patient arrives in clinic today completing  his amoxicillin  today. The cellulitis on his left leg is better. Furthermore he tells me that he had intramuscular penicillin shots that his primary care office today. However he also states that the wrap on his right leg fell down shortly after leaving clinic last week. He developed a large blister that was present when he came in for a nurse visit later in the week and then he developed intense discomfort around this area.He tells me he is using his compression pumps 03/16/18; the patient has completed his doxycycline. The infectious part of this/cellulitis in the left heel area left popliteal area is a lot better. He has 2 open areas on the right calf. Still areas on the left calf but this is a lot better as well. 03/24/18; the patient arrives complaining of pain in the left popliteal area again. He thinks some of this is wrap injury. He has no open area on the right leg and really no open area on the left calf either except for the popliteal area. He claims to be compliant with the compression pumps 03/31/18; I gave him doxycycline last week because of cellulitis in the left popliteal area. This is a lot better although the surface epithelium is denuded off and response to this. He arrives today with uncontrolled edema in the right calf area as well as a fingernail injury in the right lateral calf. There is only a few open areas on the left 04/06/18; I gave him amoxicillin doxycycline over the last 2 weeks that the amoxicillin should be completing currently. He is not complaining of any pain or systemic symptoms. The only open areas see has is on the right lateral lower leg paradoxically I cannot see anything on the left lower leg. He tells me he is using his compression pumps twice a day on most days. Silver alginate to the wounds that are open under 4 layer compression 04/13/18; he completed antibiotics and has no new complaints. Using his compression pumps. Silver alginate that anything that's opened 04/20/18; he is  using his compression pumps religiously. Silver alginate 4 layer compression anything that's opened. He comes in today with no open wounds on the left leg but 3 on the right including a new one posteriorly. He has 2 on the right lateral and one on the right posterior. He likes Unna boots on the right leg for reasons that aren't really clear we had the usual 4 layer compression on the left. It may be necessary to move to the 4 layer compression on the right however for now I left them in the Unna boots 04/27/18; he is using his compression pumps at least once a day. He has still the wounds on the right lateral calf. The area right posteriorly has closed. He does not have an open wound on the left under 4 layer compression however on the dorsal left foot just proximal to the toes and the left third toe 2 small open areas were identified 05/11/18; he has not uses compression pumps. The areas on the right lateral calf have coalesced into one large wound necrotic surface. On the left side he has one small wound anteriorly however the edema is now weeping out of a large part of his left leg. He says he wasn't using his pumps because of the weeping fluid. I explained to him that this is the time he needs to pump more 05/18/18; patient states he is using his compression pumps twice a day. The area on the right lateral  large wound albeit superficial. On the left side he has innumerable number of small new wounds on the left calf particularly laterally but several anteriorly and medially. All these appear to have healthy granulated base these look like the remnants of blisters however they occurred under compression. The patient arrives in clinic today with his legs somewhat better. There is certainly less edema, less multiple open areas on the left calf and the right anterior leg looks somewhat better as well superficial and a little smaller. However he relates pain and erythema over the last 3-4 days in the thigh  and I looked at this today. He has not been systemically unwell no fever no chills no change in blood sugar values 05/25/18; comes in today in a better state. The severe cellulitis on his left leg seems better with the Keflex. Not as tender. He has not been systemically unwell Hard to find an open wound on the left lower leg using his compression pumps twice a day The confluent wounds on his right lateral calf somewhat better looking. These will ultimately need debridement I didn't do this today. 06/01/18; the severe cellulitis on the left anterior thigh has resolved and he is completed his Keflex. There is no open wound on the left leg however there is a superficial excoriation at the base of the third toe dorsally. Skin on the bottom of his left foot is macerated looking. The left the wounds on the lateral right leg actually looks some better although he did require debridement of the top half of this wound area with an open curet 06/09/18 on evaluation today patient appears to be doing poorly in regard to his right lower extremity in particular this appears to likely be infected he has very thick purulent discharge along with a bright green tent to the discharge. This makes me concerned about the possibility of pseudomonas. He's also having increased discomfort at this point on evaluation. Fortunately there does not appear to be any evidence of infection spreading to the other location at this time. 06/16/18 on evaluation today patient appears to actually be doing fairly well. His ulcer has actually diminished in size quite significantly at this point which is good news. Nonetheless he still does have some evidence of infection he did see infectious disease this morning before coming here for his appointment. I did review the results of their evaluation and their note today. They did actually have him discontinue the Cipro and initiate treatment with linezolid at this time. He is doing this for the  next seven days and they recommended a follow-up in four months with them. He is the keep a log of the need for intermittent antibiotic therapy between now and when he falls back up with infectious disease. This will help them gaze what exactly they need to do to try and help them out. 06/23/18; the patient arrives today with no open wounds on the left leg and left third toe healed. He is been using his compression pumps twice a day. On the right lateral leg he still has a sizable wound but this is a lot better than last time I saw this. 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 Has been using silver alginate under 4 layer compression 06/30/18; the only open wound he has is on the right lateral leg and this looks healthy. No debridement is required. We have been using silver  alginate. He does not have an open wound on the left leg. There is apparently some drainage from the dorsal proximal third toe on the left although I see no open wound here. 07/03/18 on evaluation today patient was actually here just for a nurse visit rapid change. However when he was here on Wednesday for his rat change due to having been healed on the left and then developing blisters we initiated the wrap again knowing that he would be back today for [place] to reevaluate and see were at. Unfortunately he has developed some cellulitis into the proximal portion of his right lower extremity even into the region of his thigh. 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 He was placed on one as what at that point. Nonetheless he is done with that and has been tolerating it well otherwise. Doxycycline which in the past really did not seem to be effective for him. Nonetheless I think the best option may be for [place] to definitely reinitiate the antibiotics for a longer period of time. 07/07/18; since I  last saw this patient a week ago he has had a difficult time. At that point he did not have an open wound on his left leg. We transitioned him into juxta light stockings. He was apparently in the clinic the next day with blisters on the left lateral and left medial lower calf. He also had weeping edema fluid. He was put back into a compression wrap. He was also in the clinic on Friday with intense erythema in his right thigh. Per the patient he was started on Bactrim however that didn't work at all in terms of relieving his pain and swelling. He has taken 3 doxycycline that he had left over from last time and that seems to of helped. He has blistering on the right thigh as well. 07/14/18; the erythema on his right thigh has gotten better with doxycycline that he is finishing. 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 Presumably this cellulitis in the thigh was not related to that although I think this is a bit concerning going forward. He still has an area on the right lateral calf the blister on the right medial calf just below the knee that was discussed above. On the left 2 small open areas left medial and left lateral. Edema control is adequate. He is using his compression pumps twice a day 07/20/18; continued improvement in the condition of both legs especially the edema in his bilateral thighs. He tells me he is been losing weight through a combination of diet and exercise. He is using his compression pumps twice a day. So overall she made to the remaining wounds 07/27/2018; continued improvement in condition of both legs. His edema is well controlled. The area on the right lateral leg is just about closed he had one blisters show up on the medial left upper calf. We have him in 4 layer compression. He is going on a 10-day trip to [place] and [place]. He will be driving. He wants to wear Unna boots because of  the lessening amount of constriction. He will not use compression pumps while he is away 08/05/18 on evaluation today patient actually appears to be doing decently well all things considered in regard to his bilateral lower extremities. The worst ulcer is actually only posterior aspect of his left lower  extremity with a four layer compression wrap cut into his leg a couple weeks back. He did have a trip and actually had [employer] for the trip that he is worn since he was last here. Nonetheless he feels like the [employer] actually do better for him his swelling is up a little bit but he also with his trip was not taking his Lasix on a regular set schedule like he was supposed to be. He states that obviously the reason being that he cannot drive and keep going without having to urinate too frequently which makes it difficult. He did not have his pumps with him while he was away either which I think also maybe playing a role here too. 08/13/2018; the patient only has a small open wound on the right lateral calf which is a big improvement in the last month or 2. He also has the area posteriorly just below the posterior fossa on the left which I think was a wrap injury from several weeks ago. He has no current evidence of cellulitis. He tells me he is back into his compression pumps twice a day. He also tells me that while he was at the laundromat somebody stole a section of his extremitease stockings 08/20/2018; back in the clinic with a much improved state. He only has small areas on the right lateral mid calf which is just about healed. This was is more substantial area for quite a prolonged period of time. He has a small open area on the left anterior tibia. The area on the posterior calf just below the popliteal fossa is closed today. He is using his compression pumps twice a day 08/28/2018; patient has no open wound on the right leg. He has a smattering of open areas on the calf with some  weeping lymphedema. More problematically than that it looks as though his wraps of slipped down in his usual he has very angry upper area of edema just below the right medial knee and on the right lateral calf. He has no open area on his feet. The patient is traveling to [facility] next week. I will send him in an antibiotic. We will continue to wrap the right leg. We ordered extremitease stockings for him last week and I plan to transition the right leg to a stocking when he gets home which will be in 10 days time. As usual he is very reluctant to take his pumps with him when he travels 09/07/2018; patient returns from [facility]. He shows me a picture of his left leg in the mid part of his trip last week with intense fire engine erythema. The picture look bad enough I would have considered sending him to the hospital. Instead he went to the wound care center in [facility]. They did not prescribe him antibiotics but he did take some doxycycline he had leftover from a previous visit. I had given him trimethoprim sulfamethoxazole before he left this did not work according to the patient. This is resulted in some improvement fortunately. He comes back with a large wound on the left posterior calf. Smaller area on the left anterior tibia. Denuded blisters on the dorsal left foot over his toes. Does not have much in the way of wounds on the right leg although he does have a very tender area on the right posterior area just below the popliteal fossa also suggestive of infection. He promises me he is back on his pumps twice a  day 09/15/2018; the intense cellulitis in his left lower calf is a lot better. The wound area on the posterior left calf is also so better. However he has reasonably extensive wounds on the dorsal aspect of his second and third toes and the proximal foot just at the base of the toes. There is nothing open on the right leg 09/22/2018; the patient has excellent edema  control in his legs bilaterally. He is using his external compression pumps twice a day. He has no open area on the right leg and only the areas in the left foot dorsally second and third toe area on the left side. He does not have any signs of active cellulitis. 10/06/2018; the patient has good edema control bilaterally. He has no open wound on the right leg. There is a blister in the posterior aspect of his left calf that we had to deal with today. He is using his compression pumps twice a day. There is no signs of active cellulitis. We have been using silver alginate to the wound areas. He still has vulnerable areas on the base of his left first second toes dorsally He has a his extremities stockings and we are going to transition him today into the stocking on the right leg. He is cautioned that he will need to continue to use the compression pumps twice a day. If he notices uncontrolled edema in the right leg he may need to go to 3 times a day. 10/13/2018; the patient came in for a nurse check on Friday he has a large flaccid blister on the right medial calf just below the knee. We unroofed this. He has this and a new area underneath the posterior mid calf which was undoubtedly a blister as well. He also has several small areas on the right which is the area we put his extremities stocking on. 10/19/2018; the patient went to see infectious disease this morning I am not sure if that was a routine follow-up in any case the doxycycline I had given him was discontinued and started on linezolid. He has not started this. It is easy to look at his left calf and the inflammation and think this is cellulitis however he is very tender in the tissue just below the popliteal fossa and I have no doubt that there is infection going on here. He states the problem he is having is that with the compression pumps the edema goes down and then starts walking the wrap falls down. We will see if we can adhere this. He  has 1 or 2 minuscule open areas on the right still areas that are weeping on the posterior left calf, the base of his left second and third toes 10/26/18; back today in clinic with quite of skin breakdown in his left anterior leg. This may have been infection the area below the popliteal fossa seems a lot better however tremendous epithelial loss on the left anterior mid tibia area over quite inexpensive tissue. He has 2 blisters on the right side but no other open wound here. 10/29/2018; came in urgently to see [place] today and we worked him in for review. He states that the 4 layer compression on the right leg caused pain he had to cut it down to roughly his mid calf this caused swelling above the wrap and he has blisters and skin breakdown today. As a result of the pain he has not been using his pumps. Both legs are a lot more edematous and there  is a lot of weeping fluid. 11/02/18; arrives in clinic with continued difficulties in the right leg> left. Leg is swollen and painful. multiple skin blisters and new open areas especially laterally. He has not been using his pumps on the right leg. 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 He is not systemically unwell. 11/09/2018; the patient claims he is being compliant with his pumps. He is finished the doxycycline I gave him last week. Culture I did of the wound on the right lateral leg showed a few very resistant methicillin staph aureus. This was resistant to doxycycline. Nevertheless he states the pain in the leg is a lot better which makes me wonder if the cultured organism was not really what was causing the problem nevertheless this is a very dangerous organism to be culturing out of any wound. His right leg is still a lot larger than the left. He is using an Radio broadcast assistant on this area, he blames a 4-layer compression for causing the original skin breakdown which I doubt is true however I cannot talk him out  of it. We have been using silver alginate to all of these areas which were initially blisters 11/16/2018; patient is being compliant with his external compression pumps at twice a day. Miraculously he arrives in clinic today with absolutely no open wounds. He has better edema control on the left where he has been using 4 layer compression versus wound of wounds on the right and I pointed this out to him. There is no inflammation in the skin in his lower legs which is also somewhat unusual for him. There is no open wounds on the dorsal left foot. He has extremitease stockings at home and I have asked him to bring these in next week. 11/25/18 patient's lower extremity on examination today on the left appears for the most part to be wound free. He does have an open wound on the lateral aspect of the right lower extremity but this is minimal compared to what I've seen in past. He does request that we go ahead and wrap the left leg as well even though there's nothing open just so hopefully it will not reopen in short order. 1/28; patient has superficial open wounds on the right lateral calf left anterior calf and left posterior calf. His edema control is adequate. 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 We have been using silver alginate as the primary dressing. He claims compliance with his compression pumps 2/4; patient has superficial open wounds on numerous areas of his left calf and again one on the left dorsal foot. The areas on the right lateral calf have healed. The cellulitis that I gave him doxycycline for last week is also resolved this was mostly on the left anterior calf just below the tibial tuberosity. His edema looks fairly well-controlled. He tells me he went to see his primary doctor today and had blood work ordered 2/11; once again he has several open areas on the left calf left tibial area. Most of  these are small and appear to have healthy granulation. He does not have anything open on the right. The edema and control in his thighs is pretty good which is usually a good indication he has been using his pumps as requested. 2/18; he continues to have several small areas on the left calf and left tibial area. Most of  these are small healthy granulation. We put him in his stocking on the right leg last week and he arrives with a superficial open area over the right upper tibia and a fairly large area on the right lateral tibia in similar condition. His edema control actually does not look too bad, he claims to be using his compression pumps twice a day 2/25. Continued small areas on the left calf and left tibial area. New areas especially on the right are identified just below the tibial tuberosity and on the right upper tibia itself. There are also areas of weeping edema fluid even without an obvious wound. He does not have a considerable degree of lymphedema but clearly there is more edema here than his skin can handle. He states he is using the pumps twice a day. We have an Unna boot on the right and 4 layer compression on the left. 3/3; he continues to have an area on the right lateral calf and right posterior calf just below the popliteal fossa. There is a fair amount of tenderness around the wound on the popliteal fossa but I did not see any evidence of cellulitis, could just be that the wrap came down and rubbed in this area. He does not have an open area on the left leg however there is an area on the left dorsal foot at the base of the third toe We have been using silver alginate to all wound areas 3/10; he did not have an open area on his left leg last time he was here a week ago. T oday he arrives with a horizontal wound just below the tibial tuberosity and an area on the left lateral calf. He has intense erythema and tenderness in this area. The area is on the right lateral calf and  right posterior calf better than last week. We have been using silver alginate as usual 3/18 - Patient returns with 3 small open areas on left calf, and 1 small open area on right calf, the skin looks ok with no significant erythema, he continues the UNA boot on right and 4 layer compression on left. The right lateral calf wound is closed , the right posterior is small area. we will continue silver alginate to the areas. 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 He is been tolerating the dressing changes without complication. Fortunately she has made excellent improvement in regard to the overall status of his wounds. Unfortunately every time we cease wrapping him he ends up reopening in causing more significant issues at that point. Again I'm unsure of the best direction to take although I think the lymphedema clinic may be appropriate for him. 02/03/19 on evaluation today patient appears to be doing well in regard to the wounds that we saw him for last week unfortunately he has a new area on the proximal portion of his right medial/posterior lower extremity where the wrap somewhat slowed down and caused swelling and a blister to rub and open. Unfortunately this is the only opening that he has on either leg at this point. 02/17/19 on evaluation today patient's bilateral lower extremities appear to be doing well. He still completely healed in regard to the left lower extremity. In regard to the right lower extremity the area where the wrap and slid down and caused the  blister still seems to be slightly open although this is dramatically better than during the last evaluation two weeks ago. I'm very pleased with the way this stands overall. 03/03/19 on evaluation today patient appears to be doing well in regard  to his right lower extremity in general although he did have a new blister open this does not appear to be showing any evidence of active infection at this time. Fortunately there's No fevers, chills, nausea, or vomiting noted at this time. Overall I feel like he is making good progress it does feel like that the right leg will we perform the [employer] seems to do with a bit better than three layer wrap on the left which slid down on him. We may switch to doing bilateral in the book wraps. 5/4; I have not seen Mr. Mainer in quite some time. According to our case manager he did not have an open wound on his left leg last week. He had 1 remaining wound on the right posterior medial calf. He arrives today with multiple openings on the left leg probably were blisters and/or wrap injuries from Unna boots. I do not think the Unna boot's will provide adequate compression on the left. I am also not clear about the frequency he is using the compression pumps. 03/17/19 on evaluation today patient appears to be doing excellent in regard to his lower extremities compared to last week's evaluation apparently. He had gotten significantly worse last week which is unfortunate. The [employer] wrap on the left did not seem to do very well for him at all and in fact it didn't control his swelling significantly enough he had an additional outbreak. Subsequently we go back to the four layer compression wrap on the left. This is good news. At least in that he is doing better and the wound seem to be killing him. He still has not heard anything from the lymphedema clinic. 03/24/19 on evaluation today patient actually appears to be doing much better in regard to his bilateral lower Trinity as compared to last week when I saw him. Fortunately there's no signs of active infection at this time. He has been tolerating the dressing changes without complication. Overall I'm extremely pleased with the progress and appearance in  general. 04/07/19 on evaluation today patient appears to be doing well in regard to his bilateral lower extremities. His swelling is significantly down from where it was previous. With that being said he does have a couple blisters still open at this point but fortunately nothing that seems to be too severe and again the majority of the larger openings has healed at this time. 04/14/19 on evaluation today patient actually appears to be doing quite well in regard to his bilateral lower extremities in fact I'm not even sure there's anything significantly open at this time at any site. Nonetheless he did have some trouble with these wraps where they are somewhat irritating him secondary to the fact that he has noted that the graph wasn't too close down to the end of this foot in a little bit short as well up to his knee. Otherwise things seem to be doing quite well. 04/21/19 upon evaluation today patient's wound bed actually showed evidence of being completely healed in regard to both lower extremities which is excellent news. There does not appear to be any signs of active infection which is also good news. I'm very pleased in this regard. No fevers, chills, nausea, or vomiting noted at this time.  04/28/19 on evaluation today patient appears to be doing a little bit worse in regard to both lower extremities on the left mainly due to the fact that when he went infection disease the wrap was not wrapped quite high enough he developed a blister above this. On the right he is a small open area of nothing too significant but again this is continuing to give him some trouble he has been were in the Velcro compression that he has at home. 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 He's been tolerating the dressing changes without complication. Fortunately there's no signs of active infection at this time. No fevers, chills, nausea, or vomiting noted at  this time. We have been trying to get an appointment with her lymphedema clinic in [facility] but unfortunately nobody can get them on phone with not been able to even fax information over the patient likewise is not been able to get in touch with them. Overall I'm not sure exactly what's going on here with to reach out again today. 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 Still having a lot of drainage unfortunately. He tells me especially in the left but even on the right. There's no signs of active infection which is good news we've been using so ratcheted up to this point. 05/19/19 on evaluation today patient actually appears to be doing quite well with regard to his left lower extremity which is great news. Fortunately in regard to the right lower extremity has an issues with his wrap and he subsequently did remove this from what I'm understanding. Nonetheless long story short is what he had rewrapped once he removed it subsequently had maggots underneath this wrap whenever he came in for evaluation today. With that being said they were obviously completely cleaned away by the nursing staff. The visit today which is excellent news. However he does appear to potentially have some infection around the right ankle region where the maggots were located as well. He will likely require anabiotic therapy today. 05/26/19 on evaluation today patient actually appears to be doing much better in regard to his bilateral lower extremities. I feel like the infection is under much better control. With that being said there were maggots noted when the wrap was removed yet again today. Again this could have potentially been left over from previous although at this time there does not appear to be any signs of significant drainage there was obviously on the wrap some drainage as well this contracted gnats or  otherwise. Either way I do not see anything that appears to be doing worse in my pinion and in fact I think his drainage has slowed down quite significantly likely mainly due to the fact to his infection being under better control. 06/02/2019 on evaluation today patient actually appears to be doing well with regard to his bilateral lower extremities there is no signs of active infection at this time which is great news. With that being said he does have several open areas more so on the right than the left but nonetheless these are all significantly better than previously noted. 06/09/2019 on evaluation today patient actually appears to be doing well. His wrap stayed up and he did not cause any problems he had more drainage on the right compared to the left but overall I do not see any major issues  at this time which is great news. 06/16/2019 on evaluation today patient appears to be doing excellent with regard to his lower extremities the only area that is open is a new blister that can have opened as of today on the medial ankle on the left. Other than this he really seems to be doing great I see no major issues at this point. 06/23/2019 on evaluation today patient appears to be doing quite well with regard to his bilateral lower extremities. In fact he actually appears to be almost completely healed there is a small area of weeping noted of the right lower extremity just above the ankle. Nonetheless fortunately there is no signs of active infection at this time which is good news. No fevers, chills, nausea, vomiting, or diarrhea. 8/24; the patient arrived for a nurse visit today but complained of very significant pain in the left leg and therefore I was asked to look at this. Noted that he did not have an open area on the left leg last week nevertheless this was wrapped. The patient states that he is not been able to put his compression pumps on the left leg because of the discomfort. He has not been  systemically unwell 06/30/2019 on evaluation today patient unfortunately despite being excellent last week is doing much worse with regard to his left lower extremity today. In fact he had to come in for a nurse on Monday where his left leg had to be rewrapped due to excessive weeping Dr. Leanord Hawking placed him on doxycycline at that point. Fortunately there is no signs of active infection Systemically at this time which is good news. 07/07/2019 in regard to the patient's wounds today he actually seems to be doing well with his right lower extremity there really is nothing open or draining at this point this is great news. Unfortunately the left lower extremity is given him additional trouble at this time. There does not appear to be any signs of active infection nonetheless he does have a lot of edema and swelling noted at this point as well as blistering all of which has led to a much more poor appearing leg at this time compared to where it was 2 weeks ago when it was almost completely healed. Obviously this is a little discouraging for the patient. He is try to contact the lymphedema clinic in [place] he has not been able to get through to them. 07/14/2019 on evaluation today patient actually appears to be doing slightly better with regard to his left lower extremity ulcers. Overall I do feel like at least at the top of the wrap that we have been placing this area has healed quite nicely and looks much better. The remainder of the leg is showing signs of improvement. Unfortunately in the thigh area he still has an open region on the left and again on the right he has been utilizing just a Band-Aid on an area that also opened on the thigh. Again this is an area that were not able to wrap although we did do an Ace wrap to provide some compression that something that obviously is a little less effective than the compression wraps we have been using on the lower portion of the leg. He does have an  appointment with the lymphedema clinic in [facility] on Friday. 07/21/2019 on evaluation today patient appears to be doing better with regard to his lower extremity ulcers. He has been tolerating the dressing changes without complication. Fortunately there is no signs of  active infection at this time. No fevers, chills, nausea, vomiting, or diarrhea. I did receive the paperwork from the physical therapist at the lymphedema clinic in [place]. Subsequently I signed off on that this morning and sent that back to him for further progression with the treatment plan. 07/28/2019 on evaluation today patient appears to be doing very well with regard to his right lower extremity where I do not see any open wounds at this point. Fortunately he is feeling great as far as that is concerned as well. In regard to the left lower extremity he has been having issues with still several areas of weeping and edema although the upper leg is doing better his lower leg still I think is going require the compression wrap at this time. No fevers, chills, nausea, vomiting, or diarrhea. 08/04/2019 on evaluation today patient unfortunately is having new wounds on the right lower extremity. Again we have been using Unna boot wrap on that side. We switched him to using his juxta lite wrap at home. With that being said he tells me he has been using it although his legs extremely swollen and to be honest really does not appear that he has been. I cannot know that for sure however. Nonetheless he has multiple new wounds on the right lower extremity at this time. Obviously we will have to see about getting this rewrapped for him today. 08/11/2019 on evaluation today patient appears to be doing fairly well with regard to his wounds. He has been tolerating the dressing changes including the compression wraps without complication. He still has a lot of edema in his upper thigh regions bilaterally he is supposed to be seeing the  lymphedema clinic on the 15th of this month once his wraps arrive for the upper part of his legs. 08/18/2019 on evaluation today patient appears to be doing well with regard to his bilateral lower extremities at this point. He has been tolerating the dressing changes without complication. Fortunately there is no signs of active infection which is also good news. He does have a couple weeping areas on the first and second toe of the right foot he also has just a small area on the left foot upper leg and a small area on the left lower leg but overall he is doing quite well in my opinion. He is supposed to be getting his wraps shortly in fact tomorrow and then subsequently is seeing the lymphedema clinic next Wednesday on the 21st. Of note he is also leaving on the 25th to go on vacation for a week to the beach. For that reason and since there is some uncertainty about what there can be doing at lymphedema clinic next Wednesday I am get a make an appointment for next Friday here for [place] to see what we need to do for him prior to him leaving for vacation. 10/23; patient arrives in considerable pain predominantly in the upper posterior calf just distal to the popliteal fossa also in the wound anteriorly above the major wound. This is probably cellulitis and he has had this recurrently in the past. He has no open wound on the right side and he has had an Radio broadcast assistant in that area. Finally I note that he has an area on the left posterior calf which by enlarge is mostly epithelialized. This protrudes beyond the borders of the surrounding skin in the setting of dry scaly skin and lymphedema. The patient is leaving for [facility] [name] on Sunday. Per his longstanding pattern,  he will not take his compression pumps with him predominantly out of fear that they will be stolen. He therefore asked that we put a Unna boot back on the right leg. He will also contact the wound care center in [facility] to see if they can  change his dressing in the mid week. 11/3; patient returned from his vacation to [facility]. He was seen on 1 occasion at their wound care center. They did a 2 layer compression system as they did not have our 4-layer wrap. I am not completely certain what they put on the wounds. They did not change the Unna boot on the right. The patient is also seeing a lymphedema specialist physical therapist in [place]. It appears that he has some compression sleeve for his thighs which indeed look quite a bit better than I am used to seeing. He pumps over these with his external compression pumps. 11/10; the patient has a new wound on the right medial thigh otherwise there is no open areas on the right. He has an area on the left leg posteriorly anteriorly and medially and an area over the left second toe. We have been using silver alginate. He thinks the injury on his thigh is secondary to friction from the compression sleeve he has. 11/17; the patient has a new wound on the right medial thigh last week. He thinks this is because he did not have a underlying stocking for his thigh juxta lite apparatus. He now has this. The area is fairly large and somewhat angry but I do not think he has underlying cellulitis. He has a intact blister on the right anterior tibial area. Small wound on the right great toe dorsally Small area on the medial left calf. 11/30; the patient does not have any open areas on his right leg and we did not take his juxta lite stocking off. However he states that on Friday his compression wrap fell down lodging around his upper mid calf area. As usual this creates a lot of problems for him. He called urgently today to be seen for a nurse visit however the nurse visit turned into a provider visit because of extreme erythema and pain in the left anterior tibia extending laterally and posteriorly. The area that is problematic is extensive 10/06/2019 upon evaluation today patient actually  appears to be doing poorly in regard to his left lower extremity. He Dr. Leanord Hawking did place him on doxycycline this past Monday apparently due to the fact that he was doing much worse in regard to this left leg. Fortunately the doxycycline does seem to be helping. Unfortunately we are still having a very difficult time getting his edema under any type of control in order to anticipate discharge at some point. The only way were really able to control his lymphedema really is with compression wraps and that has only even seemingly temporary. He has been seeing a lymphedema clinic they are trying to help in this regard but still this has been somewhat frustrating in general for the patient. 10/13/19 on evaluation today patient appears to be doing excellent with regard to his right lower extremity as far as the wounds are concerned. His swelling is still quite extensive unfortunately. He is still having a lot of drainage from the thigh areas bilaterally which is unfortunate. He's been going to lymphedema clinic but again he still really does not have this edema under control as far as his lower extremities are concern. With regard to his left  lower extremity this seems to be improving and I do believe the doxycycline has been of benefit for him. He is about to complete the doxycycline. 10/20/2019 on evaluation today patient appears to be doing poorly in regard to his bilateral lower extremities. More in the right thigh he has a lot of irritation at this site unfortunately. In regard to the left lower extremity the wrap was not quite as high it appears and does seem to have caused him some trouble as well. Fortunately there is no evidence of systemic infection though he does have some blue-green drainage which has me concerned for the possibility of Pseudomonas. He tells me he is previously taking Cipro without complications and he really does not care for Levaquin however due to some of the side effects he  has. He is not allergic to any medications specifically antibiotics that were aware of. 10/27/2019 on evaluation today patient actually does appear to be for the most part doing better when compared to last week's evaluation. With that being said he still has multiple open wounds over the bilateral lower extremities. He actually forgot to start taking the Cipro and states that he still has the whole bottle. He does have several new blisters on left lower extremity today I think I would recommend he go ahead and take the Cipro based on what I am seeing at this point. 12/30-Patient comes at 1 week visit, 4 layer compression wraps on the left and Unna boot on the right, primary dressing Xtrasorb and silver alginate. Patient is taking his Cipro and has a few more days left probably 5-6, and the legs are doing better. He states he is using his compressions devices which I believe he has 11/10/2019 on evaluation today patient actually appears to be much better than last time I saw him 2 weeks ago. His wounds are significantly improved and overall I am very pleased in this regard. Fortunately there is no signs of active infection at this time. He is just a couple of days away from completing Cipro. Overall his edema is much better he has been using his lymphedema pumps which I think is also helping at this point. 11/17/2019 on evaluation today patient appears to be doing excellent in regard to his wounds in general. His legs are swollen but not nearly as much as they have been in the past. Fortunately he is tolerating the compression wraps without complication. No fevers, chills, nausea, vomiting, or diarrhea. He does have some erythema however in the distal portion of his right lower extremity specifically around the forefoot and toes there is a little bit of warmth here as well. 11/24/2019 on evaluation today patient appears to be doing well with regard to his right lower extremity I really do not see any open  wounds at this point. His left lower extremity does have several open areas and his right medial thigh also is open. Other than this however overall the patient seems to be making good progress and I am very pleased at this point. 12/01/2019 on evaluation today patient appears to be doing poorly at this point in regard to his left lower extremity has several new blisters despite the fact that we have him in compression wraps. In fact he had a 4-layer compression wrap, his upper thigh wrapped from lymphedema clinic, and a juxta light over top of the 4 layer compression wrap the lymphedema clinic applied and despite all this he still develop blisters underneath. Obviously this does have me concerned  about the fact that unfortunately despite what we are doing to try to get wounds healed he continues to have new areas arise I do not think he is ever good to be at the point where he can realistically just use wraps at home to keep things under control. Typically when we heal him it takes about 1-2 days before he is back in the clinic with severe breakdown and blistering of his lower extremities bilaterally. This is happened numerous times in the past. Unfortunately I think that we may need some help as far as overall fluid overload to kind of limit what we are seeing and get things under better control. 12/08/2019 on evaluation today patient presents for follow-up concerning his ongoing bilateral lower extremity edema. Unfortunately he is still having quite a bit of swelling the compression wraps are controlling this to some degree but he did see Dr. [name] his cardiologist I do have that available for review today as far as the appointment was concerned that was on 12/06/2019. Obviously that she has been 2 days ago. The patient states that he is only been taking the Lasix 80 mg 1 time a day he had told me previously he was taking this twice a day. Nonetheless Dr. [name] recommended this be up to 80 mg 2 times a  day for the patient as he did appear to be fluid overloaded. With that being said the patient states he did this yesterday and he was unable to go anywhere or do anything due to the fact that he was constantly having to urinate. Nonetheless I think that this is still good to be something that is important for him as far as trying to get his edema under control at all things that he is going to be able to just expect his wounds to get under control and things to be better without going through at least a period of time where he is trying to stabilize his fluid management in general and I think increasing the Lasix is likely the first step here. It was also mentioned the possibility that the patient may require metolazone. With that being said he wanted to have the patient take Lasix twice a day first and then reevaluating 2 months to see where things stand. 12/15/2019 upon evaluation today patient appears to be doing regard to his legs although his toes are showing some signs of weeping especially on the left at this point to some degree on the right. There does not appear to be any signs of active infection and overall I do feel like the compression wraps are doing well for him but he has not been able to take the Lasix at home and the increased dose that Dr. [name] recommended. He tells me that just not go to be feasible for him. Nonetheless I think in this case he should probably send a message to Dr. [name] in order to discuss options from the standpoint of possible admission to get the fluid off or otherwise going forward. 12/22/2019 upon evaluation today patient appears to be doing fairly well with regard to his lower extremities at this point. In fact he would be doing excellent if it was not for the fact that his right anterior thigh apparently had an allergic reaction to adhesive tape that he used. The wound itself that we have been monitoring actually appears to be healed. There is a lot of  irritation at this point. 12/29/2019 upon evaluation today patient appears to be doing well  in regard to his lower extremities. His left medial thigh is open and somewhat draining today but this is the only region that is open the right has done much better with the treatment utilizing the steroid cream that I prescribed for him last week. Overall I am pleased in that regard. Fortunately there is no signs of active infection at this time. No fevers, chills, nausea, vomiting, or diarrhea. 01/05/2020 upon evaluation today patient appears to be doing more poorly in regard to his right lower extremity at this point upon evaluation today. Unfortunately he continues to have issues in this regard and I think the biggest issue is controlling his edema. This obviously is not very well controlled at this point is been recommended that he use the Lasix twice a day but he has not been able to do that. Unfortunately I think this is leading to an issue where honestly he is not really able to effectively control his edema and therefore the wounds really are not doing significantly better. I do not think that he is going to be able to keep things under good control unless he is able to control his edema much better. I discussed this again in great detail with him today. 01/12/2020 good news is patient actually appears to be doing quite well today at this point. He does have an appointment with lymphedema clinic tomorrow. His legs appear healed and the toe on the left is almost completely healed. In general I am very pleased with how things stand at this point. 01/19/2020 upon evaluation today patient appears to actually be doing well in regard to his lower extremities there is nothing open at this point. Fortunately he has done extremely well more recently. Has been seeing lymphedema clinic as well. With that being said he has Velcro wraps for his lower legs as well as his upper legs. The only wound really is on his toe  which is the right great toe and this is barely anything even there. With all that being said I think it is good to be appropriate today to go ahead and switch him over to the Velcro compression wraps. 01/26/2020 upon evaluation today patient appears to be doing worse with regard to his lower extremities after last week switch him to Velcro compression wraps. Unfortunately he lasted less than 24 hours he did not have the sock portion of his Velcro wrap on the left leg and subsequently developed a blister underneath the Velcro portion. Obviously this is not good and not what we were looking for at this point. He states the lymphedema clinic did tell him to wear the wrap for 23 hours and take him off for 1 I am okay with that plan but again right now we got a get things back under control again he may have some cellulitis noted as well. 02/02/2020 upon evaluation today patient unfortunately appears to have several areas of blistering on his bilateral lower extremities today mainly on the feet. His legs do seem to be doing somewhat better which is good news. Fortunately there is no evidence of active infection at this time. No fevers, chills, nausea, vomiting, or diarrhea. 02/16/2020 upon evaluation today patient appears to be doing well at this time with regard to his legs. He has a couple weeping areas on his toes but for the most part everything is doing better and does appear to be sealed up on his legs which is excellent news. We can continue with wrapping him at this point  as he had every time we discontinue the wraps he just breaks out with new wounds. There is really no point in is going forward with this at this point. 03/08/2020 upon evaluation today patient actually appears to be doing quite well with regard to his lower extremity ulcers. He has just a very superficial and really almost nonexistent blister on the left lower extremity he has in general done very well with the compression wraps. With  that being said I do not see any signs of infection at this time which is good news. 03/29/2020 upon evaluation today patient appears to be doing well with regard to his wounds currently except for where he had several new areas that opened up due to some of the wrap slipping and causing him trouble. He states he did not realize they had slipped. Nonetheless he has a 1 area on the right and 3 new areas on the left. Fortunately there is no signs of active infection at this time which is great news. 04/05/2020 upon evaluation today patient actually appears to be doing quite well in general in regard to his legs currently. Fortunately there is no signs of active infection at this time. No fevers, chills, nausea, vomiting, or diarrhea. He tells me next week that he will actually be seen in the lymphedema clinic on Thursday at 10 AM I see him on Wednesday next week. 04/12/2020 upon evaluation today patient appears to be doing very well with regard to his lower extremities bilaterally. In fact he does not appear to have any open wounds at this point which is good news. Fortunately there is no signs of active infection at this time. No fevers, chills, nausea, vomiting, or diarrhea. 04/19/2020 upon evaluation today patient appears to be doing well with regard to his wounds currently on the bilateral lower extremities. There does not appear to be any signs of active infection at this time. Fortunately there is no evidence of systemic infection and overall very pleased at this point. Nonetheless after I held him out last week he literally had blisters the next morning already which swelled up with him being right back here in the clinic. Overall I think that he is just not can be able to be discharged with his legs the way they are he is much to volume overloaded as far as fluid is concerned and that was discussed with him today of also discussed this but should try the clinic nurse manager as well as Dr.  Leanord Hawking. 04/26/2020 upon evaluation today patient appears to be doing better with regard to his wounds currently. He is making some progress and overall swelling is under good control with the compression wraps. Fortunately there is no evidence of active infection at this time. 05/10/2020 on evaluation today patient appears to be doing overall well in regard to his lower extremities bilaterally. He is Tolerating the compression wraps without complication and with what we are seeing currently I feel like that he is making excellent progress. There is no signs of active infection at this time. 05/24/2020 upon evaluation today patient appears to be doing well in regard to his legs. The swelling is actually quite a bit down compared to where it has been in the past. Fortunately there is no sign of active infection at this time which is also good news. With that being said he does have several wounds on his toes that have opened up at this point. 05/31/2020 upon evaluation today patient appears to be doing well  with regard to his legs bilaterally where he really has no significant fluid buildup at this point overall he seems to be doing quite well. Very pleased in this regard. With regard to his toes these also seem to be drying up which is excellent. We have continue to wrap him as every time we tried as a transition to the juxta light wraps things just do not seem to get any better. 06/07/2020 upon evaluation today patient appears to be doing well with regard to his right leg at this point. Unfortunately left leg has a lot of blistering he tells me the wrap started to slide down on him when he tried to put his other Velcro wrap over top of it to help keep things in order but nonetheless still had some issues. 06/14/2020 on evaluation today patient appears to be doing well with regard to his lower extremity ulcers and foot ulcers at this point. I feel like everything is actually showing signs of improvement which  is great news overall there is no signs of active infection at this time. No fevers, chills, nausea, vomiting, or diarrhea. 06/21/2020 on evaluation today patient actually appears to be doing okay in regard to his wounds in general. With that being said the biggest issue I see is on his right foot in particular the first and second toe seem to be doing a little worse due to the fact this is staying very wet. I think he is probably getting need to change out his dressings a couple times in between each week when we see him in regard to his toes in order to keep this drier based on the location and how this is proceeding. 06/28/2020 on evaluation today patient appears to be doing a little bit more poorly overall in regard to the appearance of the skin I am actually somewhat concerned about the possibility of him having a little bit of an infection here. We discussed the course of potentially giving him a doxycycline prescription which he is taken previously with good result. With that being said I do believe that this is potentially mild and at this point easily fixed. I just do not want anything to get any worse. 07/12/2020 upon evaluation today patient actually appears to be making some progress with regard to his legs which is great news there does not appear to be any evidence of active infection. Overall very pleased with where things stand. 07/26/2020 upon evaluation today patient appears to be doing well with regard to his leg ulcers and toe ulcers at this point. He has been tolerating the compression wraps without complication overall very pleased in this regard. 08/09/2020 upon evaluation today patient appears to be doing well with regard to his lower extremities bilaterally. Fortunately there is no signs of active infection overall I am pleased with where things stand. 08/23/2020 on evaluation today patient appears to be doing well with regard to his wound. He has been tolerating the dressing  changes without complication. Fortunately there is no signs of active infection at this time. Overall his legs seem to be doing quite well which is great news and I am very pleased in that regard. No fevers, chills, nausea, vomiting, or diarrhea. 09/13/2020 upon evaluation today patient appears to be doing okay in regard to his lower extremities. He does have a fairly large blister on the right leg which I did remove the blister tissue from today so we can get this to dry out other than that however he  seems to be doing quite well. There is no signs of active infection at this time. 09/27/2020 upon evaluation today patient appears to actually be doing some better in regard to his right leg. Fortunately signs of active infection at this time which is great news. No fevers, chills, nausea, vomiting, or diarrhea. 10/04/2020 upon evaluation today patient actually appears to be showing signs of improvement which is great news with regard to his leg ulcers. Fortunately there is no signs of active infection which is great news he is still taking the antibiotics currently. No fevers, chills, nausea, vomiting, or diarrhea. 10/18/2020 on evaluation today patient appears to be doing well with regard to his legs currently. He has been tolerating the dressing changes including the wraps without complication. Fortunately there is no signs of active infection at this time. No fevers, chills, nausea, vomiting, or diarrhea. 10/25/2020 upon evaluation today patient appears to be doing decently well in regard to his wounds currently. He has been tolerating the dressing changes without complication. Overall I feel like he is making good progress albeit slow. Again this is something we can have to continue to wrap for some time to come most likely. 11/08/2020 upon evaluation today patient appears to be doing well with regard to his wounds currently. He has been tolerating the dressing changes without complication is not  currently on any antibiotics and he does not appear to show any signs of infection. He does continue to have a lot of drainage on the right leg not too severe but nonetheless this is very scattered. On the left leg this is looking to be much improved overall. 11/15/2020 upon evaluation today patient appears to be doing better with regard to his legs bilaterally. Especially the right leg which was much more significant last week. There does not appear to be any signs of active infection which is great news. No fevers, chills, nausea, vomiting, or diarrhea. 11/23/2019 upon evaluation today patient appears to be doing poorly still in regard to his lower extremities bilaterally. Unfortunately his right leg in particular appears to be doing much more poorly there is no signs really of infection this is not warm to touch but he does have a lot of drainage and weeping unfortunately. With that reason I do believe that we may need to initiate some treatment here to try to help calm down some of the swelling of the right leg. I think switching to a 4-layer compression wrap would be beneficial here. The patient is in agreement with giving this a try. 11/29/2020 upon evaluation today patient appears to be doing well currently in regard to his leg ulcers. I feel like the right leg is doing better he still has a lot of drainage but we do see some improvement here. The 4-layer compression wrap I think was helpful. 12/06/2020 upon evaluation today patient appears to be doing well with regard to his legs. In fact they seem to be doing about the best I have seen up to this point. Fortunately there is no signs of active infection at this time. No fevers, chills, nausea, vomiting, or diarrhea. 12/20/2020 upon evaluation today patient appears to be doing well at this time in regard to his legs. He is not having any significant draining which is great news. Fortunately there is no signs of active infection at this time. No fevers,  chills, nausea, vomiting, or diarrhea. 01/17/2021 upon evaluation today Verdon CumminsJesse actually appears to be doing excellent in regard to his legs. He has a  few areas again that come and go as far as his toes are concerned but overall this is doing quite well. 01/31/2021 upon evaluation today patient appears to be doing well with regard to his legs. Fortunately there does not appear to be any signs of active infection which is great news. Overall he is still having significant edema despite the compression wraps basically the 4-layer compression wrap to just keep things under control there is really not much room for play. 4/13: Mr. Rushing is a longstanding patient in our clinic and benefits greatly from weekly compression wraps. Today he has no complaints. He has been tolerating the wraps well. He states he is using the lymphedema pumps at home. 5/4; patient presents for follow-up of his chronic lymphedema/venous insufficiency ulcers. He comes weekly for compression wraps. He has no complaints today. He was unable to tolerate the Coflex 2 layer Last week so we will do the four press 4-layer compression. He has been using his lymphedema pumps daily. 5/18; patient presents for 2-week follow-up. He has no complaints or issues today. He has developed a new wound to the right foot on his fourth toe. He overall feels well and denies signs of infection. 6/1; patient presents for 2-week follow-up. He has no complaints or issues today. He denies signs of infection. 04/18/2021 upon evaluation today patient appears to be doing well with regard to his legs bilaterally. Family open wound is actually on the toe of his left foot everything else is completely closed which is great news. In general I am extremely pleased with where things stand at this point. The patient is also happy that things are doing so well. 05/02/2021 upon evaluation today patient's legs actually appear to be doing quite well today. Fortunately there  does not appear to be any signs of active infection which is great and overall I am extremely pleased with where he stands today. The patient does not appear to have any evidence of active infection at this time which is also great news. 05/09/2021 upon evaluation today patient appears to be doing a little bit more poorly in regard to his legs. Unfortunately he is having issues with some breakdown and a blood blister on the left leg this is due to I believe honestly to how it was wrapped last week. Fortunately there does not appear to be any signs of infection but nonetheless this is still a concern to be honest. No fevers, chills, nausea, vomiting, or diarrhea. 05/16/2021 upon evaluation today patient appears to be doing significantly better as compared to last week. I am very pleased with where things stand today. There does not appear to be any signs of infection which is great news and overall very pleased with where we stand. No fevers, chills, nausea, vomiting, or diarrhea. 05/30/2021 upon evaluation today patient appears to be doing well with regard to his legs. He has been tolerating the dressing changes without complication. Fortunately there does not appear to be any signs of active infection which is great news and overall I am extremely pleased with where things stand today. No fevers, chills, nausea, vomiting, or diarrhea. 06/20/2021 upon evaluation today patient actually appears to be making good progress today and very pleased with what we are seeing. I think his legs are really maintaining. As long as we continue wrapping he seems to be doing excellent in my opinion. Fortunately there is no signs of active infection at this time. No fevers, chills, nausea, vomiting, or diarrhea. 07/11/2021 upon evaluation  today patient actually appears to be making excellent progress at this time. Fortunately there does not appear to be any evidence of active infection which is great news and overall I am  extremely pleased with where things stand today. No fevers, chills, nausea, vomiting, or diarrhea. 07/25/2021 upon evaluation today patient appears to be doing well currently in regard to his lower extremities. He has been making good progress here and I do not see anything that is actually open significantly today this is great news. No fevers, chills, nausea, vomiting, or diarrhea. 08/08/2021 upon evaluation today patient appears to be doing well with regard to his wound. He has been tolerating the dressing changes without complication. With that being said unfortunately has a new area that opened up as far as his right posterior leg is concerned this was a blister he also has an area on the third toe right foot which also reopen. Fortunately there is no signs of active infection at this time which is great news. No fevers, chills, nausea, vomiting, or diarrhea. 10/17; patient came in today at his request initially for a nurse visit because it but out of concern for deterioration in both his lower legs and cellulitis I was asked to look at him. He comes in with increased swelling which he says started over the weekend he started to notice pain as well in his left medial ankle, right knee, left knee left dorsal foot. His wraps fell down contributing to some of this but he has not been using his compression pumps over the weekend for reasons that are not really clear. He comes in with multiple new wounds including the right posterior leg, right third toe, right fourth toe, left second toe left medial ankle left dorsal ankle and right anterior lower leg 09/19/2021 upon evaluation today patient does seem to be making improvements in general which is great news. I do not see any evidence of infection currently he does have some hypergranulation of the anterior portion of his ankle on the left side this is going require some debridement to pare this down and then subsequently silver nitrate probably due to  the amount of bleeding that he is probably going to experience. He is in agreement with this plan however. 09/26/2021 upon evaluation today although the patient's legs appear to be doing okay today unfortunately he did have maggots noted during the evaluation as well. This is again quite unfortunate with the respect to the fact that he feels like that he got the wrap wet which was noted today he is not sure when and I think this is what led to the issue. Nonetheless we can try to see what we can do about silly things off so that this does not happen again in the future although he is can have to be diligent about taking care of his wraps as well. 10/03/2021 upon evaluation today patient appears to be doing okay in regard to his legs currently. He unfortunately has maggots again noted on the left foot. This obviously is becoming quite an issue to be perfectly honest. I do think that he needs to see what can be done at home to try to limit this exposure. Last week we had cleaned everything away so this is a new infestation as far as that is concerned. There is really not much that I can do to combat that I am trying to do what we can here in the clinic to wrap his foot and try to prevent any  access of that again with knots there is a small this becomes very difficult. 10/10/2021 upon evaluation today patient appears to be doing poorly in regard to his legs he is very swollen and unfortunately I think a big part of the issue here is simply that he is not taking his fluid pills appropriately stressed probably is not helping much at all either. He still having a lot of issues as far as getting moved and having to be out of his current living condition. With that being said he does have of note maggots noted on the right foot this time completely separate from what we have been seeing he tells me that he got his wraps wet again. Its mainly when it rains it sounds like that he goes out to his car in the area  where he has to park there is a area that collects a fairly large puddle according to what he tells me today either way I explained to him which we have done before as well that if he gets his wraps wet he needs to let us know so we can get them changed out. He does not need to keep them on wet. 10/17/2021 upon evaluation today patient appears to be doing well in regard to his legs. In fact things are significantly better compared to where they were previous. Fortunately I see no evidence of infection currently which is great news. No fevers, chills, nausea, vomiting, or diarrhea. 10/24/2021 upon evaluation today patient appears to be doing awesome in regard to his legs in general. I think everything is showing signs of significant improvement which is great news. There is really only 1 opening on the second toe left foot everything else is closed. 11/07/2020 upon evaluation today patient appears to be doing well with regard to his legs in general he does have a blister on the left foot but otherwise things are doing overall quite well. 11/28/2021 upon evaluation today patient's legs appear to be doing decently well. The biggest issue we see is that he does have some blistering where the wrap seems to have slid down on the right leg near the top. This is just below the knee. That is good to be the biggest issue going forward at this point in my opinion. Otherwise I really feel like he is doing pretty well across the board with the other wound locations previously noted. Electronic Signature(s) Signed: 11/28/2021 11:27:31 AM By: Lenda Kelp PA-C Entered By: Lenda Kelp on 11/28/2021 11:27:31 -------------------------------------------------------------------------------- Physical Exam Details Patient Name: Date of Service: [facility], [name] 11/28/2021 10:30 A M Medical Record Number: [id number] Patient Account Number: [id number] Date of Birth/Sex: Treating RN: [birth year] (71 y.o. [name] Primary Care Provider: Nicoletta Ba Other Clinician: Referring Provider: Treating Provider/Extender: Adele Dan in Treatment: 305 Constitutional Well-nourished and well-hydrated in no acute distress. Respiratory normal breathing without difficulty. Psychiatric this patient is able to make decisions and demonstrates good insight into disease process. Alert and Oriented x 3. pleasant and cooperative. Notes Upon inspection patient's wounds again all appear to be doing quite well he has 2 blisters where the wrap slid down the right leg just below the knee but otherwise seems to be doing excellent. There does not appear to be any evidence of infection no erythema, warmth, or purulent drainage. Electronic Signature(s) Signed: 11/28/2021 11:27:54 AM By: Lenda Kelp PA-C Entered By: Lenda Kelp on 11/28/2021 11:27:54 -------------------------------------------------------------------------------- Physician Orders Details Patient Name:  Date of Service: [name], [name] 11/28/2021 10:30 A M Medical Record Number: [id number] Patient Account Number: [id number] Date of Birth/Sex: Treating RN: [birth year] (71 y.o. [name], [name] Primary Care Provider: Nicoletta Ba Other Clinician: Referring Provider: Treating Provider/Extender: Adele Dan in Treatment: [phone] Verbal / Phone Orders: No Diagnosis Coding ICD-10 Coding Code Description L97.521 Non-pressure chronic ulcer of other part of left foot limited to breakdown of skin L97.829 Non-pressure chronic ulcer of other part of left lower leg with unspecified severity L97.519 Non-pressure chronic ulcer of other part of right foot with unspecified severity L97.812 Non-pressure chronic ulcer of other part of right lower leg with fat layer exposed I87.333 Chronic venous hypertension (idiopathic) with ulcer and inflammation of bilateral lower extremity L03.116 Cellulitis of left lower  limb I89.0 Lymphedema, not elsewhere classified 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 Leonard Schwartz Wednesday Return A Bathing/ Shower/ Hygiene May shower with protection but do not get wound dressing(s) wet. Edema Control - Lymphedema / SCD / Other Bilateral Lower Extremities Lymphedema Pumps. Use Lymphedema pumps on leg(s) 2-3 times a day for 45-60 minutes. If wearing any wraps or hose, do not remove them. Continue exercising as instructed. Elevate legs to the level of the heart or above for 30 minutes daily and/or when sitting, a frequency of: - throughout the day Avoid standing for long periods of time. Exercise regularly Other Edema Control Orders/Instructions: - right leg 4 layer compression with unna boot to secure to upper portion of lower leg. apply lotion to right leg. Wound Treatment Wound #204 - T Second oe Wound Laterality: Left Prim Dressing: KerraCel Ag Gelling Fiber Dressing, 2x2 in (silver alginate) ary Discharge Instructions: Apply silver alginate to wound bed as instructed Secondary Dressing: Woven Gauze Sponges 2x2 in Discharge Instructions: Apply over primary dressing as directed. Secured With: Coban Self-Adherent Wrap 4x5 (in/yd) Discharge Instructions: Secure with Coban as directed. Wound #210 - Foot Wound Laterality: Dorsal, Left Cleanser: Soap and Water 1 x Per Week Discharge Instructions: May shower and wash wound with dial antibacterial soap and water prior to dressing change. Cleanser: Wound Cleanser 1 x Per Week Discharge Instructions: Cleanse the wound with wound cleanser prior to applying a clean dressing using gauze sponges, not tissue or cotton balls. Peri-Wound Care: Zinc Oxide Ointment 30g tube 1 x Per Week Discharge Instructions: Apply Zinc Oxide to periwound with each dressing  change Peri-Wound Care: Sween Lotion (Moisturizing lotion) 1 x Per Week Discharge Instructions: Apply moisturizing lotion as directed Prim Dressing: KerraCel Ag Gelling Fiber Dressing, 4x5 in (silver alginate) 1 x Per Week ary Discharge Instructions: Apply silver alginate to wound bed as instructed Secondary Dressing: ABD Pad, 8x10 1 x Per Week Discharge Instructions: Apply over primary dressing as directed. Compression Wrap: FourPress (4 layer compression wrap) 1 x Per Week Discharge Instructions: Apply four layer compression apply unna boot first layer to upper portion of lower leg. Electronic Signature(s) Signed: 11/28/2021 3:45:35 PM By: Lenda Kelp PA-C Signed: 11/28/2021 4:52:26 PM By: Shawn Stall RN, BSN Entered By: Shawn Stall on 11/28/2021 11:23:44 -------------------------------------------------------------------------------- Problem List Details Patient Name: Date of Service: [name], [name] 11/28/2021 10:30 A M Medical Record Number: [id number] Patient Account Number: [id number] Date of Birth/Sex: Treating RN: 12/30/1950 (71 y.o. 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 breakdown of 04/27/2018 No Yes skin L97.829 Non-pressure chronic ulcer of other part of left lower leg with unspecified 03/21/2021 No Yes severity L97.519 Non-pressure chronic ulcer of other part of right foot with unspecified severity 03/21/2021 No Yes L97.812 Non-pressure chronic ulcer of other part of right lower leg with fat layer 08/08/2021 No Yes exposed I87.333 Chronic venous hypertension (idiopathic) with ulcer and inflammation of 01/22/2016 No Yes bilateral lower extremity L03.116 Cellulitis of left lower  limb 08/20/2021 No Yes I89.0 Lymphedema, not elsewhere classified 01/22/2016 No Yes E11.40 Type 2 diabetes mellitus with diabetic neuropathy, unspecified 01/22/2016 No Yes E11.622 Type 2 diabetes mellitus with other skin ulcer 01/22/2016 No Yes L03.115 Cellulitis of right lower limb 12/22/2017 No Yes Inactive Problems ICD-10 Code Description Active Date Inactive Date 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 06/30/2018 06/30/2018 L97.511 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 of skin 06/30/2018 06/30/2018 Resolved Problems ICD-10 Code Description Active Date Resolved Date L97.211 Non-pressure chronic ulcer of right calf limited to breakdown of skin 06/30/2018 06/30/2018 L97.221 Non-pressure chronic ulcer of left calf limited to breakdown of skin 09/30/2016 09/30/2016 L03.116 Cellulitis of left lower limb 04/01/2017 04/01/2017 L97.211 Non-pressure chronic ulcer of right calf limited to breakdown of skin 06/30/2017 06/30/2017 Electronic Signature(s) Signed: 11/28/2021 10:53:35 AM By: Lenda Kelp PA-C Entered By: Lenda Kelp on 11/28/2021 10:53:34 -------------------------------------------------------------------------------- Progress Note Details Patient Name: Date of Service: [name], [name] 11/28/2021 10:30 A M Medical Record Number: [id number] Patient Account Number: [id number] Date of Birth/Sex: Treating RN: Jul 13, 1951 (71 y.o. [name] Primary Care Provider: Nicoletta Ba Other Clinician: Referring Provider: Treating Provider/Extender: Adele Dan in Treatment: [phone] Subjective Chief Complaint Information obtained from Patient Patient is here for evaluation of venous/lymphedema ulcers History of Present Illness (HPI) Referred by PCP for consultation. Patient has long standing history of BLE venous stasis, no prior ulcerations. At beginning of month, developed cellulitis  and weeping. Received IM Rocephin followed by Keflex and resolved. Wears compression stocking, appr 6 months old. Not sure strength. No present drainage. 01/22/16 this is a patient who is a type II diabetic on insulin. 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 He tells me he religiously wears pressure stockings of uncertain strength. He was here with weeping edema about 8 months ago but did not have an open wound. Roughly a month ago he had a reopening on his bilateral legs. He is been using bandages and Neosporin. He does not complain of pain. 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 Last BUN/creatinine I have is from 11/20/15 at 13 and 1.0 respectively 01/29/16; patient arrives today having tolerated the Profore wrap. He brought in his stockings and these are 18 mmHg stockings he bought from [place]. The compression here is likely inadequate. He does not complain of pain or excessive drainage she has no systemic symptoms. The wound on the right looks improved as does the one on the left although one on the left is more substantial with still tissue at risk below the actual wound area on the bilateral posterior calf 02/05/16; patient arrives with poor edema control. He states that  we did put a 4 layer compression on it last week. No weight appear 5 this. 02/12/16; the area on the posterior right Has healed. The left Has a substantial wound that has necrotic surface eschar that requires a debridement with a curette. 02/16/16;the patient called or a Nurse visit secondary to increased swelling. He had been in earlier in the week with his right leg healed. He was transitioned to is on pressure stocking on the right leg with the only open wound on the left, a substantial area on the left posterior calf. Note he has a history of severe lower extremity edema, he has a  history of chronic atrial fibrillation but not heart failure per my notes but I'll need to research this. He is not complaining of chest pain shortness of breath or orthopnea. The intake nurse noted blisters on the previously closed right leg 02/19/16; this is the patient's regular visit day. I see him on Friday with escalating edema new wounds on the right leg and clear signs of at least right ventricular heart failure. I increased his Lasix to 40 twice a day. He is returning currently in follow-up. States he is noticed a decrease in that the edema 02/26/16 patient's legs have much less edema. There is nothing really open on the right leg. The left leg has improved condition of the large superficial wound on the posterior left leg 03/04/16; edema control is very much better. The patient's right leg wounds have healed. On the left leg he continues to have severe venous inflammation on the posterior aspect of the left leg. There is no tenderness and I don't think any of this is cellulitis. 03/11/16; patient's right leg is married healed and he is in his own stocking. The patient's left leg has deteriorated somewhat. There is a lot of erythema around the wound on the posterior left leg. There is also a significant rim of erythema posteriorly just above where the wrap would've ended there is a new wound in this location and a lot of tenderness. Can't rule out cellulitis in this area. 03/15/16; patient's right leg remains healed and he is in his own stocking. The patient's left leg is much better than last review. His major wound on the posterior aspect of his left Is almost fully epithelialized. He has 3 small injuries from the wraps. Really. Erythema seems a lot better on antibiotics 03/18/16; right leg remains healed and he is in his own stocking. The patient's left leg is much better. The area on the posterior aspect of the left calf is fully epithelialized. His 3 small injuries which were wrap injuries on the  left are improved only one seems still open his erythema has resolved 03/25/16; patient's right leg remains healed and he is in his own stocking. There is no open area today on the left leg posterior leg is completely closed up. His wrap injuries at the superior aspect of his leg are also resolved. He looks as though he has some irritation on the dorsal ankle but this is fully epithelialized without evidence of infection. 03/28/16; we discharged this patient on Monday. Transitioned him into his own stocking. There were problems almost immediately with uncontrolled swelling weeping edema multiple some of which have opened. He does not feel systemically unwell in particular no chest pain no shortness of breath and he does not feel 04/08/16; the edema is under better control with the Profore light wrap but he still has pitting edema. There is one large wound anteriorly  2 on the medial aspect of his left leg and 3 small areas on the superior posterior calf. Drainage is not excessive he is tolerating a Profore light well 04/15/16; put a Profore wrap on him last week. This is controlled is edema however he had a lot of pain on his left anterior foot most of his wounds are healed 04/22/16 once again the patient has denuded areas on the left anterior foot which he states are because his wrap slips up word. He saw his primary physician today is on Lasix 40 twice a day and states that he his weight is down 20 pounds over the last 3 months. 04/29/16: Much improved. left anterior foot much improved. He is now on Lasix 80 mg per day. Much improved edema control 05/06/16; I was hoping to be able to discharge him today however once again he has blisters at a low level of where the compression was placed last week mostly on his left lateral but also his left medial leg and a small area on the anterior part of the left foot. 05/09/16; apparently the patient went home after his appointment on 7/4 later in the evening developing  pain in his upper medial thigh together with subjective fever and chills although his temperature was not taken. The pain was so intense he felt he would probably have to call 911. However he then remembered that he had leftover doxycycline from a previous round of antibiotics and took these. By the next morning he felt a lot better. He called and spoke to one of our nurses and I approved doxycycline over the phone thinking that this was in relation to the wounds we had previously seen although they were definitely were not. The patient feels a lot better old fever no chills he is still working. Blood sugars are reasonably controlled 05/13/16; patient is back in for review of his cellulitis on his anterior medial upper thigh. He is taking doxycycline this is a lot better. Culture I did of the nodular area on the dorsal aspect of his foot grew MRSA this also looks a lot better. 05/20/16; the patient is cellulitis on the medial upper thigh has resolved. All of his wound areas including the left anterior foot, areas on the medial aspect of the left calf and the lateral aspect of the calf at all resolved. He has a new blister on the left dorsal foot at the level of the fourth toe this was excised. No evidence of infection 05/27/16; patient continues to complain weeping edema. He has new blisterlike wounds on the left anterior lateral and posterior lateral calf at the top of his wrap levels. The area on his left anterior foot appears better. He is not complaining of fever, pain or pruritus in his feet. 05/30/16; the patient's blisters on his left anterior leg posterior calf all look improved. He did not increase the Lasix 100 mg as I suggested because he was going to run out of his 40 mg tablets. He is still having weeping edema of his toes 06/03/16; I renewed his Lasix at 80 mg once a day as he was about to run out when I last saw him. He is on 80 mg of Lasix now. I have asked him to cut down on the excessive  amount of water he was drinking and asked him to drink according to his thirst mechanisms 06/12/2016 -- was seen 2 days ago and was supposed to wear his compression stockings at home but he is  developed lymphedema and superficial blisters on the left lower extremity and hence came in for a review 06/24/16; the remaining wound is on his left anterior leg. He still has edema coming from between his toes. There is lymphedema here however his edema is generally better than when I last saw this. He has a history of atrial fibrillation but does not have a known history of congestive heart failure nevertheless I think he probably has this at least on a diastolic basis. 07/01/16 I reviewed his echocardiogram from January 2017. This was essentially normal. He did not have LVH, EF of 55-60%. His right ventricular function was normal although he did have trivial tricuspid and pulmonic regurgitation. This is not audible on exam however. I increased his Lasix to do massive edema in his legs well above his knees I think in early July. He was also drinking an excessive amount of water at the time. 07/15/16; missed his appointment last week because of the Labor Day holiday on Monday. He could not get another appointment later in the week. Started to feel the wrap digging in superiorly so we remove the top half and the bottom half of his wrap. He has extensive erythema and blistering superiorly in the left leg. Very tender. Very swollen. Edema in his foot with leaking edema fluid. He has not been systemically unwell 07/22/16; the area on the left leg laterally required some debridement. The medial wounds look more stable. His wrap injury wounds appear to have healed. Edema and his foot is better, weeping edema is also better. He tells me he is meeting with the supplier of the external compression pumps at work 08/05/16; the patient was on vacation last week in [facility]. His wrap is been on for an extended period of  time. Also over the weekend he developed an extensive area of tender erythema across his anterior medial thigh. He took to doxycycline yesterday that he had leftover from a previous prescription. The patient complains of weeping edema coming out of his toes 08/08/16; I saw this patient on 10/2. He was tender across his anterior thigh. I put him on doxycycline. He returns today in follow-up. He does not have any open wounds on his lower leg, he still has edema weeping into his toes. 08/12/16; patient was seen back urgently today to follow-up for his extensive left thigh cellulitis/erysipelas. He comes back with a lot less swelling and erythema pain is much better. I believe I gave him Augmentin and Cipro. His wrap was cut down as he stated a roll down his legs. He developed blistering above the level of the wrap that remained. He has 2 open blisters and 1 intact. 08/19/16; patient is been doing his primary doctor who is increased his Lasix from 40-80 once a day or 80 already has less edema. Cellulitis has remained improved in the left thigh. 2 open areas on the posterior left calf 08/26/16; he returns today having new open blisters on the anterior part of his left leg. He has his compression pumps but is not yet been shown how to use some vital representative from the supplier. 09/02/16 patient returns today with no open wounds on the left leg. Some maceration in his plantar toes 09/10/2016 -- Dr. Leanord Hawking had recently discharged him on 09/02/2016 and he has come right back with redness swelling and some open ulcers on his left lower extremity. He says this was caused by trying to apply his compression stockings and he's been unable to use this  and has not been able to use his lymphedema pumps. He had some doxycycline leftover and he has started on this a few days ago. 09/16/16; there are no open wounds on his leg on the left and no evidence of cellulitis. He does continue to have probable lymphedema of  his toes, drainage and maceration between his toes. He does not complain of symptoms here. I am not clear use using his external compression pumps. 09/23/16; I have not seen this patient in 2 weeks. He canceled his appointment 10 days ago as he was going on vacation. He tells me that on Monday he noticed a large area on his posterior left leg which is been draining copiously and is reopened into a large wound. He is been using ABDs and the external part of his juxtalite, according to our nurse this was not on properly. 10/07/16; Still a substantial area on the posterior left leg. Using silver alginate 10/14/16; in general better although there is still open area which looks healthy. Still using silver alginate. He reminds me that this happen before he left for [facility]. T oday while he was showering in the morning. He had been using his juxtalite's 10/21/16; the area on his posterior left leg is fully epithelialized. However he arrives today with a large area of tender erythema in his medial and posterior left thigh just above the knee. I have marked the area. Once again he is reluctant to consider hospitalization. I treated him with oral antibiotics in the past for a similar situation with resolution I think with doxycycline however this area it seems more extensive to me. He is not complaining of fever but does have chills and says states he is thirsty. His blood sugar today was in the 140s at home 10/25/16 the area on his posterior left leg is fully epithelialized although there is still some weeping edema. The large area of tenderness and erythema in his medial and posterior left thigh is a lot less tender although there is still a lot of swelling in this thigh. He states he feels a lot better. He is on doxycycline and Augmentin that I started last week. This will continued until Tuesday, December 26. I have ordered a duplex ultrasound of the left thigh rule out DVT whether there is an abscess  something that would need to be drained I would also like to know. 11/01/16; he still has weeping edema from a not fully epithelialized area on his left posterior calf. Most of the rest of this looks a lot better. He has completed his antibiotics. His thigh is a lot better. Duplex ultrasound did not show a DVT in the thigh 11/08/16; he comes in today with more Denuded surface epithelium from the posterior aspect of his calf. There is no real evidence of cellulitis. The superior aspect of his wrap appears to have put quite an indentation in his leg just below the knee and this may have contributed. He does not complain of pain or fever. We have been using silver alginate as the primary dressing. The area of cellulitis in the right thigh has totally resolved. He has been using his compression stockings once a week 11/15/16; the patient arrives today with more loss of epithelium from the posterior aspect of his left calf. He now has a fairly substantial wound in this area. The reason behind this deterioration isn't exactly clear although his edema is not well controlled. He states he feels he is generally more swollen systemically. He  is not complaining of chest pain shortness of breath fever. T me he has an appointment with his primary physician in early February. He is on 80 mg of oral ells Lasix a day. He claims compliance with the external compression pumps. He is not having any pain in his legs similar to what he has with his recurrent cellulitis 11/22/16; the patient arrives a follow-up of his large area on his left lateral calf. This looks somewhat better today. He came in earlier in the week for a dressing change since I saw him a week ago. He is not complaining of any pain no shortness of breath no chest pain 11/28/16; the patient arrives for follow-up of his large area on the left lateral calf this does not look better. In fact it is larger weeping edema. The surface of the wound does not look too  bad. We have been using silver alginate although I'm not certain that this is a dressing issue. 12/05/16; again the patient follows up for a large wound on the left lateral and left posterior calf this does not look better. There continues to be weeping edema necrotic surface tissue. More worrisome than this once again there is erythema below the wound involving the distal Achilles and heel suggestive of cellulitis. He is on his feet working most of the day of this is not going well. We are changing his dressing twice a week to facilitate the drainage. 12/12/16; not much change in the overall dimensions of the large area on the left posterior calf. This is very inflamed looking. I gave him an. Doxycycline last week does not really seem to have helped. He found the wrap very painful indeed it seems to of dog into his legs superiorly and perhaps around the heel. He came in early today because the drainage had soaked through his dressings. 12/19/16- patient arrives for follow-up evaluation of his left lower extremity ulcers. He states that he is using his lymphedema pumps once daily when there is "no drainage". He admits to not using his lipedema pumps while under current treatment. His blood sugars have been consistently between 150-200. 12/26/16; the patient is not using his compression pumps at home because of the wetness on his feet. I've advised him that I think it's important for him to use this daily. He finds his feet too wet, he can put a plastic bag over his legs while he is in the pumps. Otherwise I think will be in a vicious circle. We are using silver alginate to the major area on his left posterior calf 01/02/17; the patient's posterior left leg has further of all into 3 open wounds. All of them covered with a necrotic surface. He claims to be using his compression pumps once a day. His edema control is marginal. Continue with silver alginate 01/10/17; the patient's left posterior leg actually looks  somewhat better. There is less edema, less erythema. Still has 3 open areas covered with a necrotic surface requiring debridement. He claims to be using his compression pumps once a day his edema control is better 01/17/17; the patient's left posterior calf look better last week when I saw him and his wrap was changed 2 days ago. He has noted increasing pain in the left heel and arrives today with much larger wounds extensive erythema extending down into the entire heel area especially tender medially. He is not systemically unwell CBGs have been controlled no fever. Our intake nurse showed me limegreen drainage on his AVD pads.  01/24/17; his usual this patient responds nicely to antibiotics last week giving him Levaquin for presumed Pseudomonas. The whole entire posterior part of his leg is much better much less inflamed and in the case of his Achilles heel area much less tender. He has also had some epithelialization posteriorly there are still open areas here and still draining but overall considerably better 01/31/17- He has continue to tolerate the compression wraps. he states that he continues to use the lymphedema pumps daily, and can increase to twice daily on the weekends. He is voicing no complaints or concerns regarding his LLE ulcers 02/07/17-he is here for follow-up evaluation. He states that he noted some erythema to the left medial and anterior thigh, which he states is new as of yesterday. He is concerned about recurrent cellulitis. He states his blood sugars have been slightly elevated, this morning in the 180s 02/14/17; he is here for follow-up evaluation. When he was last here there was erythema superiorly from his posterior wound in his anterior thigh. He was prescribed Levaquin however a culture of the wound surface grew MRSA over the phone I changed him to doxycycline on Monday and things seem to be a lot better. 02/24/17; patient missed his appointment on Friday therefore we changed  his nurse visit into a physician visit today. Still using silver alginate on the large area of the posterior left thigh. He isn't new area on the dorsal left second toe 03/03/17; actually better today although he admits he has not used his external compression pumps in the last 2 days or so because of work responsibilities over the weekend. 03/10/17; continued improvement. External compression pumps once a day almost all of his wounds have closed on the posterior left calf. Better edema control 03/17/17; in general improved. He still has 3 small open areas on the lateral aspect of his left leg however most of the area on the posterior part of his leg is epithelialized. He has better edema control. He has an ABD pad under his stocking on the right anterior lower leg although he did not let us look at that today. 03/24/17; patient arrives back in clinic today with no open areas however there are areas on the posterior left calf and anterior left calf that are less than 100% epithelialized. His edema is well controlled in the left lower leg. There is some pitting edema probably lymphedema in the left upper thigh. He uses compression pumps at home once per day. I tried to get him to do this twice a day although he is very reticent. 04/01/2017 -- for the last 2 days he's had significant redness, tenderness and weeping and came in for an urgent visit today. 04/07/17; patient still has 6 more days of doxycycline. He was seen by Dr. Meyer Russel last Wednesday for cellulitis involving the posterior aspect, lateral aspect of his Involving his heel. For the most part he is better there is less erythema and less weeping. He has been on his feet for 12 hours o2 over the weekend. Using his compression pumps once a day 04/14/17 arrives today with continued improvement. Only one area on the posterior left calf that is not fully epithelialized. He has intense bilateral venous inflammation associated with his chronic venous  insufficiency disease and secondary lymphedema. We have been using silver alginate to the left posterior calf wound In passing he tells [place] today that the right leg but we have not seen in quite some time has an open area on it but  he doesn't want [place] to look at this today states he will show this to [place] next week. 04/21/17; there is no open area on his left leg although he still reports some weeping edema. He showed [place] his right leg today which is the first time we've seen this leg in a long time. He has a large area of open wound on the right leg anteriorly healthy granulation. Quite a bit of swelling in the right leg and some degree of venous inflammation. He told us about the right leg in passing last week but states that deterioration in the right leg really only happened over the weekend 04/28/17; there is no open area on the left leg although there is an irritated part on the posterior which is like a wrap injury. The wound on the right leg which was new from last week at least to [place] is a lot better. 05/05/17; still no open area on the left leg. Patient is using his new compression stocking which seems to be doing a good job of controlling the edema. He states he is using his compression pumps once per day. The right leg still has an open wound although it is better in terms of surface area. Required debridement. A lot of pain in the posterior right Achilles marked tenderness. Usually this type of presentation this patient gives concern for an active cellulitis 05/12/17; patient arrives today with his major wound from last week on the right lateral leg somewhat better. Still requiring debridement. He was using his compression stocking on the left leg however that is reopened with superficial wounds anteriorly he did not have an open wound on this leg previously. He is still using his juxta light's once daily at night. He cannot find the time to do this in the morning as he has to be at work by 7  AM 05/19/17; right lateral leg wound looks improved. No debridement required. The concerning area is on the left posterior leg which appears to almost have a subcutaneous hemorrhagic component to it. We've been using silver alginate to all the wounds 05/26/17; the right lateral leg wound continues to look improved. However the area on the left posterior calf is a tightly adherent surface. [place] using silver alginate. Because of the weeping edema in his legs there is very little good alternatives. 06/02/17; the patient left here last week looking quite good. Major wound on the left posterior calf and a small one on the right lateral calf. Both of these look satisfactory. He tells me that by Wednesday he had noted increased pain in the left leg and drainage. He called on Thursday and Friday to get an appointment here but we were blocked. He did not go to urgent care or his primary physician. He thinks he had a fever on Thursday but did not actually take his temperature. He has not been using his compression pumps on the left leg because of pain. I advised him to go to the emergency room today for IV antibiotics for stents of left leg cellulitis but he has refused I have asked him to take 2 days off work to keep his leg elevated and he has refused this as well. In view of this I'm going to call him and Augmentin and doxycycline. 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 However after a detailed discussion regarding the risk benefits and alternatives he refuses to get admitted to the hospital. With no other recourse I will continue him on oral antibiotics as before and hopefully he'll have his infectious disease consultation this week. 06/16/2017 -- the patient was  seen today by the nurse practitioner at infectious disease Ms. Dixon. 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 He has also been advise Lotrimin cream locally. she also had wise IV antibiotics for his condition if it flares up 06/23/17; patient arrives today with drainage bilaterally although the remaining wound on the left posterior calf after cleaning up today "highlighter yellow drainage" did not look too bad. Unfortunately he has had breakdown on the right anterior leg [previously this leg had not been open and he is using a black stocking] he went to see infectious disease and is been put on clindamycin 150 mg daily, I did not verify the dose although I'm not familiar with using clindamycin in this dosing range, perhaps for prophylaxisoo 06/27/17; I brought this patient back today to follow-up on the wound deterioration on the right lower leg together with surrounding cellulitis. I started him on doxycycline 4 days ago. This area looks better however he comes in today with intense cellulitis on the medial part of his left thigh. This is not have a wound in this area. Extremely tender. We've been using silver alginate to the wounds on the right lower leg left lower leg with bilateral 4 layer compression he is using his external compression pumps once a day 07/04/17; patient's left medial thigh cellulitis looks better. He has not been using his compression pumps as his insert said it was contraindicated with cellulitis. His right leg continues to make improvements all the wounds are still open. We only have one remaining wound on the left posterior calf. Using silver alginate to all open areas. He is on doxycycline which I started a week ago and should be finishing I gave him Augmentin after Thursday's visit for the severe  cellulitis on the left medial thigh which fortunately looks better 07/14/17; the patient's left medial thigh cellulitis has resolved. The cellulitis in his right lower calf on the right also looks better. All of his wounds are stable to improved we've been using silver alginate he has completed the antibiotics I have given him. He has clindamycin 150 mg once a day prescribed by infectious disease for prophylaxis, I've advised him to start this now. We have been using bilateral Unna boots over silver alginate to the wound areas 07/21/17; the patient is been to see infectious disease who noted his recurrent problems with cellulitis. He was not able to tolerate prophylactic clindamycin therefore he is on amoxicillin 500 twice a day. He also had a second daily dose of Lasix added By Dr. Oneta Rack but he is not taking this. Nor is he being completely compliant with his compression pumps a especially not this week. He has 2 remaining wounds one on the right posterior lateral lower leg and one on the left posterior medial lower leg. 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 The patient has Unna boots bilaterally. Still wounds on his right lateral,  left medial, and a new open area on the left anterior lateral lower leg 08/04/17; he remains on amoxicillin twice a day for prophylaxis of recurrent cellulitis. He has bilateral Unna boots for compression and silver alginate to his wounds. Arrives today with his legs looking as good as I have seen him in quite some time. Not surprisingly his wounds look better as well with improvement on the right lateral leg venous insufficiency wound and also the left medial leg. He is still using the compression pumps once a day 08/11/17; both legs appear to be doing better wounds on the right lateral and left medial legs look better. Skin on the right leg quite good. He is been using silver alginate as the  primary dressing. I'm going to use Anasept gel calcium alginate and maintain all the secondary dressings 08/18/17; the patient continues to actually do quite well. The area on his right lateral leg is just about closed the left medial also looks better although it is still moist in this area. His edema is well controlled we have been using Anasept gel with calcium alginate and the usual secondary dressings, 4 layer compression and once daily use of his compression pumps "always been able to manage 09/01/17; the patient continues to do reasonably well in spite of his trip to [place]. The area on the right lateral leg is epithelialized. Left is much better but still open. He has more edema and more chronic erythema on the left leg [venous inflammation] 09/08/17; he arrives today with no open wound on the right lateral leg and decently controlled edema. Unfortunately his left leg is not nearly as in his good situation as last week.he apparently had increasing edema starting on Saturday. He edema soaked through into his foot so used a plastic bag to walk around his home. The area on the medial right leg which was his open area is about the same however he has lost surface epithelium on the left lateral which is new and he has significant pain in the Achilles area of the left foot. He is already on amoxicillin chronically for prophylaxis of cellulitis in the left leg 09/15/17; he is completed a week of doxycycline and the cellulitis in the left posterior leg and Achilles area is as usual improved. He still has a lot of edema and fluid soaking through his dressings. There is no open wound on the right leg. He saw infectious disease NP today 09/22/17;As usual 1 we transition him from our compression wraps to his stockings things did not go well. He has several small open areas on the right leg. He states this was caused by the compression wrap on his skin although he did not wear this with the stockings over  them. He has several superficial areas on the left leg medially laterally posteriorly. He does not have any evidence of active cellulitis especially involving the left Achilles The patient is traveling from [facility] Saturday going to [facility]. He states he isn't attempting to get an appointment with a heel objects wound center there to change his dressings. I am not completely certain whether this will work 10/06/17; the patient came in on Friday for a nurse visit and the nurse reported that his legs actually look quite good. He arrives in clinic today for his regular follow-up visit. He has a new wound on his left third toe over the PIP probably caused by friction with his footwear. He has small areas on the left leg and a  very superficial but epithelialized area on the right anterior lateral lower leg. Other than that his legs look as good as I've seen him in quite some time. We have been using silver alginate Review of systems; no chest pain no shortness of breath other than this a 10 point review of systems negative 10/20/17; seen by Dr. Meyer Russel last week. He had taken some antibiotics [doxycycline] that he had left over. Dr. Meyer Russel thought he had candida infection and declined to give him further antibiotics. He has a small wound remaining on the right lateral leg several areas on the left leg including a larger area on the left posterior several left medial and anterior and a small wound on the left lateral. The area on the left dorsal third toe looks a lot better. ROS; Gen.; no fever, respiratory no cough no sputum Cardiac no chest pain other than this 10 point review of system is negative 10/30/17; patient arrives today having fallen in the bathtub 3 days ago. It took him a while to get up. He has pain and maceration in the wounds on his left leg which have deteriorated. He has not been using his pumps he also has some maceration on the right lateral leg. 11/03/17; patient continues to  have weeping edema especially in the left leg. This saturates his dressings which were just put on on 12/27. As usual the doxycycline seems to take care of the cellulitis on his lower leg. He is not complaining of fever, chills, or other systemic symptoms. He states his leg feels a lot better on the doxycycline I gave him empirically. He also apparently gets injections at his primary doctor's officeo Rocephin for cellulitis prophylaxis. I didn't ask him about his compression pump compliance today I think that's probably marginal. Arrives in the clinic with all of his dressings primary and secondary macerated full of fluid and he has bilateral edema 11/10/17; the patient's right leg looks some better although there is still a cluster of wounds on the right lateral. The left leg is inflamed with almost circumferential skin loss medially to laterally although we are still maintaining anteriorly. He does not have overt cellulitis there is a lot of drainage. He is not using compression pumps. We have been using silver alginate to the wound areas, there are not a lot of options here 11/17/17; the patient's right leg continues to be stable although there is still open wounds, better than last week. The inflammation in the left leg is better. Still loss of surface layer epithelium especially posteriorly. There is no overt cellulitis in the amount of edema and his left leg is really quite good, tells me he is using his compression pumps once a day. 11/24/17; patient's right leg has a small superficial wound laterally this continues to improve. The inflammation in the left leg is still improving however we have continuous surface layer epithelial loss posteriorly. There is no overt cellulitis in the amount of edema in both legs is really quite good. He states he is using his compression pumps on the left leg once a day for 5 out of 7 days 12/01/17; very small superficial areas on the right lateral leg continue to  improve. Edema control in both legs is better today. He has continued loss of surface epithelialization and left posterior calf although I think this is better. We have been using silver alginate with large number of absorptive secondary dressings 4 layer on the left Unna boot on the right at his request.  He tells me he is using his compression pumps once a day 12/08/17; he has no open area on the right leg is edema control is good here. ooOn the left leg however he has marked erythema and tenderness breakdown of skin. He has what appears to be a wrap injury just distal to the popliteal fossa. 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 There was swelling erythema and breakdown in the right leg. He has much worse skin breakdown in the left leg as well multiple open areas medially and posteriorly as well as laterally. He tells me he has been using his compression pumps but tells me he feels that the drainage out of his leg is worse when he uses a compression pumps. T be fair to him he is been saying this o for a while however I don't know that I have really been listening to this. I wonder if the compression pumps are working properly 12/22/17;. Once again he arrives with severe erythema, weeping edema from the left greater than right leg. Noncompliance with compression pumps. New this visit he is complaining of pain on the lateral aspect of the right leg and the medial aspect of his right thigh. He apparently saw his cardiologist Dr. [name] who was ordered an echocardiogram area and I think this is a step in the right direction 12/25/17; started his doxycycline Monday night. There is still intense erythema of the right leg  especially in the anterior thigh although there is less tenderness. The erythema around the wound on the right lateral calf also is less tender. He still complaining of pain in the left heel. His wounds are about the same right lateral left medial left lateral. Superficial but certainly not close to closure. He denies being systemically unwell no fever chills no abdominal pain no diarrhea 12/29/17; back in follow-up of his extensive right calf and right thigh cellulitis. I added amoxicillin to cover possible doxycycline resistant strep. This seems to of done the trick he is in much less pain there is much less erythema and swelling. He has his echocardiogram at 11:00 this morning. X-ray of the left heel was also negative. 01/05/18; the patient arrived with his edema under much better control. Now that he is retired he is able to use his compression pumps daily and sometimes twice a day per the patient. He has a wound on the right leg the lateral wound looks better. Area on the left leg also looks a lot better. He has no evidence of cellulitis in his bilateral thighs I had a quick peak at his echocardiogram. He is in normal ejection fraction and normal left ventricular function. He has moderate pulmonary hypertension moderately reduced right ventricular function. One would have to wonder about chronic sleep apnea although he says he doesn't snore. He'll review the echocardiogram with his cardiologist. 01/12/18; the patient arrives with the edema in both legs under exemplary control. He is using his compression pumps daily and sometimes twice daily. His wound on the right lateral leg is just about closed. He still has some weeping areas on the posterior left calf and lateral left calf although everything is just about closed here as well. I have spoken with Aldean Baker who is  the patient's nurse practitioner and infectious disease. She was concerned that the patient had not understood that the  parenteral penicillin injections he was receiving for cellulitis prophylaxis was actually benefiting him. I don't think the patient actually saw that I would tend to agree we were certainly dealing with less infections although he had a serious one last month. 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 He is healed. He'll be placed in juxtalite compression wraps and increase his lymphedema pumps to twice daily. We will follow up again next week to ensure there are no issues with the new regiment. 01/20/18-he is here for evaluation of bilateral lower extremity weeping edema. Yesterday he was placed in compression wrap to the right lower extremity and compression stocking to left lower shrubbery. He states he uses lymphedema pumps last night and again this morning and noted a blister to the left lower extremity. On exam he was noted to have drainage to the right lower extremity. He will be placed in Unna boots bilaterally and follow-up next week 01/26/18; patient was actually discharged a week ago to his own juxta light stockings only to return the next day with bilateral lower extremity weeping edema.he was placed in bilateral Unna boots. He arrives today with pain in the back of his left leg. There is no open area on the right leg however there is a linear/wrap injury on the left leg and weeping edema on the left leg posteriorly. I spoke with infectious disease about 10 days ago. They were disappointed that the patient elected to discontinue prophylactic intramuscular penicillin shots as they felt it was particularly beneficial in reducing the frequency of his cellulitis. I discussed this with the patient today. He does not share this view. He'll definitely need antibiotics today. Finally he is traveling to [place] and trauma leaving this Saturday and returning a week later and he does not travel with his pumps. He is going by car 01/30/18; patient was seen 4 days ago  and brought back in today for review of cellulitis in the left leg posteriorly. I put him on amoxicillin this really hasn't helped as much as I might like. He is also worried because he is traveling to [facility] trauma by car. Finally we will be rewrapping him. There is no open area on the right leg over his left leg has multiple weeping areas as usual 02/09/18; The same wrap on for 10 days. He did not pick up the last doxycycline I prescribed for him. He apparently took 4 days worth he already had. There is nothing open on his right leg and the edema control is really quite good. He's had damage in the left leg medially and laterally especially probably related to the prolonged use of Unna boots 02/12/18; the patient arrived in clinic today for a nurse visit/wrap change. He complained of a lot of pain in the left posterior calf. He is taking doxycycline that I previously prescribed for him. Unfortunately even though he used his stockings and apparently used to compression pumps twice a day he has weeping edema coming out of the lateral part of his right leg. This is coming from the lower anterior lateral skin area. 02/16/18; the patient has finished his doxycycline and will finish the amoxicillin 2 days. The area of cellulitis in the left calf posteriorly has resolved. He is no longer having any pain. He tells me he is using his compression pumps at least once a day sometimes twice. 02/23/18;  the patient finished his doxycycline and Amoxil last week. On Friday he noticed a small erythematous circle about the size of a quarter on the left lower leg just above his ankle. This rapidly expanded and he now has erythema on the lateral and posterior part of the thigh. This is bright red. Also has an area on the dorsal foot just above his toes and a tender area just below the left popliteal fossa. He came off his prophylactic penicillin injections at his own insistence one or 2 months ago. This is obviously  deteriorated since then 03/02/18; patient is on doxycycline and Amoxil. Culture I did last week of the weeping area on the back of his left calf grew group B strep. I have therefore renewed the amoxicillin 500 3 times a day for a further week. He has not been systemically unwell. Still complaining of an area of discomfort right under his left popliteal fossa. There is no open wound on the right leg. He tells me that he is using his pumps twice a day on most days 03/09/18; patient arrives in clinic today completing his amoxicillin today. The cellulitis on his left leg is better. Furthermore he tells me that he had intramuscular penicillin shots that his primary care office today. However he also states that the wrap on his right leg fell down shortly after leaving clinic last week. He developed a large blister that was present when he came in for a nurse visit later in the week and then he developed intense discomfort around this area.He tells me he is using his compression pumps 03/16/18; the patient has completed his doxycycline. The infectious part of this/cellulitis in the left heel area left popliteal area is a lot better. He has 2 open areas on the right calf. Still areas on the left calf but this is a lot better as well. 03/24/18; the patient arrives complaining of pain in the left popliteal area again. He thinks some of this is wrap injury. He has no open area on the right leg and really no open area on the left calf either except for the popliteal area. He claims to be compliant with the compression pumps 03/31/18; I gave him doxycycline last week because of cellulitis in the left popliteal area. This is a lot better although the surface epithelium is denuded off and response to this. He arrives today with uncontrolled edema in the right calf area as well as a fingernail injury in the right lateral calf. There is only a few open areas on the left 04/06/18; I gave him amoxicillin doxycycline over the  last 2 weeks that the amoxicillin should be completing currently. He is not complaining of any pain or systemic symptoms. The only open areas see has is on the right lateral lower leg paradoxically I cannot see anything on the left lower leg. He tells me he is using his compression pumps twice a day on most days. Silver alginate to the wounds that are open under 4 layer compression 04/13/18; he completed antibiotics and has no new complaints. Using his compression pumps. Silver alginate that anything that's opened 04/20/18; he is using his compression pumps religiously. Silver alginate 4 layer compression anything that's opened. He comes in today with no open wounds on the left leg but 3 on the right including a new one posteriorly. He has 2 on the right lateral and one on the right posterior. He likes Unna boots on the right leg for reasons that aren't really clear  we had the usual 4 layer compression on the left. It may be necessary to move to the 4 layer compression on the right however for now I left them in the Unna boots 04/27/18; he is using his compression pumps at least once a day. He has still the wounds on the right lateral calf. The area right posteriorly has closed. He does not have an open wound on the left under 4 layer compression however on the dorsal left foot just proximal to the toes and the left third toe 2 small open areas were identified 05/11/18; he has not uses compression pumps. The areas on the right lateral calf have coalesced into one large wound necrotic surface. On the left side he has one small wound anteriorly however the edema is now weeping out of a large part of his left leg. He says he wasn't using his pumps because of the weeping fluid. I explained to him that this is the time he needs to pump more 05/18/18; patient states he is using his compression pumps twice a day. The area on the right lateral large wound albeit superficial. On the left side he has innumerable  number of small new wounds on the left calf particularly laterally but several anteriorly and medially. All these appear to have healthy granulated base these look like the remnants of blisters however they occurred under compression. The patient arrives in clinic today with his legs somewhat better. There is certainly less edema, less multiple open areas on the left calf and the right anterior leg looks somewhat better as well superficial and a little smaller. However he relates pain and erythema over the last 3-4 days in the thigh and I looked at this today. He has not been systemically unwell no fever no chills no change in blood sugar values 05/25/18; comes in today in a better state. The severe cellulitis on his left leg seems better with the Keflex. Not as tender. He has not been systemically unwell ooHard to find an open wound on the left lower leg using his compression pumps twice a day ooThe confluent wounds on his right lateral calf somewhat better looking. These will ultimately need debridement I didn't do this today. 06/01/18; the severe cellulitis on the left anterior thigh has resolved and he is completed his Keflex. ooThere is no open wound on the left leg however there is a superficial excoriation at the base of the third toe dorsally. Skin on the bottom of his left foot is macerated looking. ooThe left the wounds on the lateral right leg actually looks some better although he did require debridement of the top half of this wound area with an open curet 06/09/18 on evaluation today patient appears to be doing poorly in regard to his right lower extremity in particular this appears to likely be infected he has very thick purulent discharge along with a bright green tent to the discharge. This makes me concerned about the possibility of pseudomonas. He's also having increased discomfort at this point on evaluation. Fortunately there does not appear to be any evidence of infection  spreading to the other location at this time. 06/16/18 on evaluation today patient appears to actually be doing fairly well. His ulcer has actually diminished in size quite significantly at this point which is good news. Nonetheless he still does have some evidence of infection he did see infectious disease this morning before coming here for his appointment. I did review the results of their evaluation and their  note today. They did actually have him discontinue the Cipro and initiate treatment with linezolid at this time. He is doing this for the next seven days and they recommended a follow-up in four months with them. He is the keep a log of the need for intermittent antibiotic therapy between now and when he falls back up with infectious disease. This will help them gaze what exactly they need to do to try and help them out. 06/23/18; the patient arrives today with no open wounds on the left leg and left third toe healed. He is been using his compression pumps twice a day. On the right lateral leg he still has a sizable wound but this is a lot better than last time I saw this. 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 Has been using silver alginate under 4 layer compression 06/30/18; the only open wound he has is on the right lateral leg and this looks healthy. No debridement is required. We have been using silver alginate. He does not have an open wound on the left leg. There is apparently some drainage from the dorsal proximal third toe on the left although I see no open wound here. 07/03/18 on evaluation today patient was actually here just for a nurse visit rapid change. However when he was here on Wednesday for his rat change due to having been healed on the left and then developing blisters we initiated the wrap again knowing that he would be back today for [place] to reevaluate and see were at.  Unfortunately he has developed some cellulitis into the proximal portion of his right lower extremity even into the region of his thigh. 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 He was placed on one as what at that point. Nonetheless he is done with that and has been tolerating it well otherwise. Doxycycline which in the past really did not seem to be effective for him. Nonetheless I think the best option may be for [place] to definitely reinitiate the antibiotics for a longer period of time. 07/07/18; since I last saw this patient a week ago he has had a difficult time. At that point he did not have an open wound on his left leg. We transitioned him into juxta light stockings. He was apparently in the clinic the next day with blisters on the left lateral and left medial lower calf. He also had weeping edema fluid. He was put back into a compression wrap. He was also in the clinic on Friday with intense erythema in his right thigh. Per the patient he was started on Bactrim however that didn't work at all in terms of relieving his pain and swelling. He has taken 3 doxycycline that he had left over from last time and that seems to of helped. He has blistering on the right thigh as well. 07/14/18; the erythema on his right thigh has gotten better with doxycycline that he is finishing. 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 Presumably this cellulitis in the thigh was not related to that although I think this is a bit concerning going forward. He still has an area on the right lateral calf the blister on the right medial calf just below the knee that was discussed above. On the left  2 small open areas left medial and left lateral. Edema control is adequate. He is using his compression pumps twice a day 07/20/18; continued improvement in the condition of both legs especially the edema in his  bilateral thighs. He tells me he is been losing weight through a combination of diet and exercise. He is using his compression pumps twice a day. So overall she made to the remaining wounds 07/27/2018; continued improvement in condition of both legs. His edema is well controlled. The area on the right lateral leg is just about closed he had one blisters show up on the medial left upper calf. We have him in 4 layer compression. He is going on a 10-day trip to [place], [place] and [place]. He will be driving. He wants to wear Unna boots because of the lessening amount of constriction. He will not use compression pumps while he is away 08/05/18 on evaluation today patient actually appears to be doing decently well all things considered in regard to his bilateral lower extremities. The worst ulcer is actually only posterior aspect of his left lower extremity with a four layer compression wrap cut into his leg a couple weeks back. He did have a trip and actually had [employer] for the trip that he is worn since he was last here. Nonetheless he feels like the [employer] actually do better for him his swelling is up a little bit but he also with his trip was not taking his Lasix on a regular set schedule like he was supposed to be. He states that obviously the reason being that he cannot drive and keep going without having to urinate too frequently which makes it difficult. He did not have his pumps with him while he was away either which I think also maybe playing a role here too. 08/13/2018; the patient only has a small open wound on the right lateral calf which is a big improvement in the last month or 2. He also has the area posteriorly just below the posterior fossa on the left which I think was a wrap injury from several weeks ago. He has no current evidence of cellulitis. He tells me he is back into his compression pumps twice a day. He also tells me that while he was at the laundromat  somebody stole a section of his extremitease stockings 08/20/2018; back in the clinic with a much improved state. He only has small areas on the right lateral mid calf which is just about healed. This was is more substantial area for quite a prolonged period of time. He has a small open area on the left anterior tibia. The area on the posterior calf just below the popliteal fossa is closed today. He is using his compression pumps twice a day 08/28/2018; patient has no open wound on the right leg. He has a smattering of open areas on the calf with some weeping lymphedema. More problematically than that it looks as though his wraps of slipped down in his usual he has very angry upper area of edema just below the right medial knee and on the right lateral calf. He has no open area on his feet. The patient is traveling to [facility] next week. I will send him in an antibiotic. We will continue to wrap the right leg. We ordered extremitease stockings for him last week and I plan to transition the right leg to a stocking when he gets home which will be in 10 days  time. As usual he is very reluctant to take his pumps with him when he travels 09/07/2018; patient returns from [facility]. He shows me a picture of his left leg in the mid part of his trip last week with intense fire engine erythema. The picture look bad enough I would have considered sending him to the hospital. Instead he went to the wound care center in [facility]. They did not prescribe him antibiotics but he did take some doxycycline he had leftover from a previous visit. I had given him trimethoprim sulfamethoxazole before he left this did not work according to the patient. This is resulted in some improvement fortunately. He comes back with a large wound on the left posterior calf. Smaller area on the left anterior tibia. Denuded blisters on the dorsal left foot over his toes. Does not have much in the way of wounds on the  right leg although he does have a very tender area on the right posterior area just below the popliteal fossa also suggestive of infection. He promises me he is back on his pumps twice a day 09/15/2018; the intense cellulitis in his left lower calf is a lot better. The wound area on the posterior left calf is also so better. However he has reasonably extensive wounds on the dorsal aspect of his second and third toes and the proximal foot just at the base of the toes. There is nothing open on the right leg 09/22/2018; the patient has excellent edema control in his legs bilaterally. He is using his external compression pumps twice a day. He has no open area on the right leg and only the areas in the left foot dorsally second and third toe area on the left side. He does not have any signs of active cellulitis. 10/06/2018; the patient has good edema control bilaterally. He has no open wound on the right leg. There is a blister in the posterior aspect of his left calf that we had to deal with today. He is using his compression pumps twice a day. There is no signs of active cellulitis. We have been using silver alginate to the wound areas. He still has vulnerable areas on the base of his left first second toes dorsally He has a his extremities stockings and we are going to transition him today into the stocking on the right leg. He is cautioned that he will need to continue to use the compression pumps twice a day. If he notices uncontrolled edema in the right leg he may need to go to 3 times a day. 10/13/2018; the patient came in for a nurse check on Friday he has a large flaccid blister on the right medial calf just below the knee. We unroofed this. He has this and a new area underneath the posterior mid calf which was undoubtedly a blister as well. He also has several small areas on the right which is the area we put his extremities stocking on. 10/19/2018; the patient went to see infectious disease this  morning I am not sure if that was a routine follow-up in any case the doxycycline I had given him was discontinued and started on linezolid. He has not started this. It is easy to look at his left calf and the inflammation and think this is cellulitis however he is very tender in the tissue just below the popliteal fossa and I have no doubt that there is infection going on here. He states the problem he is having is  that with the compression pumps the edema goes down and then starts walking the wrap falls down. We will see if we can adhere this. He has 1 or 2 minuscule open areas on the right still areas that are weeping on the posterior left calf, the base of his left second and third toes 10/26/18; back today in clinic with quite of skin breakdown in his left anterior leg. This may have been infection the area below the popliteal fossa seems a lot better however tremendous epithelial loss on the left anterior mid tibia area over quite inexpensive tissue. He has 2 blisters on the right side but no other open wound here. 10/29/2018; came in urgently to see [place] today and we worked him in for review. He states that the 4 layer compression on the right leg caused pain he had to cut it down to roughly his mid calf this caused swelling above the wrap and he has blisters and skin breakdown today. As a result of the pain he has not been using his pumps. Both legs are a lot more edematous and there is a lot of weeping fluid. 11/02/18; arrives in clinic with continued difficulties in the right leg> left. Leg is swollen and painful. multiple skin blisters and new open areas especially laterally. He has not been using his pumps on the right leg. 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 He is not systemically unwell. 11/09/2018; the patient claims he is being compliant with his pumps. He is finished the doxycycline I gave him last week. Culture I did of the wound on the  right lateral leg showed a few very resistant methicillin staph aureus. This was resistant to doxycycline. Nevertheless he states the pain in the leg is a lot better which makes me wonder if the cultured organism was not really what was causing the problem nevertheless this is a very dangerous organism to be culturing out of any wound. His right leg is still a lot larger than the left. He is using an Radio broadcast assistant on this area, he blames a 4-layer compression for causing the original skin breakdown which I doubt is true however I cannot talk him out of it. We have been using silver alginate to all of these areas which were initially blisters 11/16/2018; patient is being compliant with his external compression pumps at twice a day. Miraculously he arrives in clinic today with absolutely no open wounds. He has better edema control on the left where he has been using 4 layer compression versus wound of wounds on the right and I pointed this out to him. There is no inflammation in the skin in his lower legs which is also somewhat unusual for him. There is no open wounds on the dorsal left foot. He has extremitease stockings at home and I have asked him to bring these in next week. 11/25/18 patient's lower extremity on examination today on the left appears for the most part to be wound free. He does have an open wound on the lateral aspect of the right lower extremity but this is minimal compared to what I've seen in past. He does request that we go ahead and wrap the left leg as well even though there's nothing open just so hopefully it will not reopen in short order. 1/28; patient has superficial open wounds on the right lateral calf left anterior calf and left posterior calf. His edema control is adequate. He has an area of  very tender erythematous skin at the superior upper part of his calf compatible with his recurrent cellulitis. We have been using silver alginate as the primary dressing. He claims  compliance with his compression pumps 2/4; patient has superficial open wounds on numerous areas of his left calf and again one on the left dorsal foot. The areas on the right lateral calf have healed. The cellulitis that I gave him doxycycline for last week is also resolved this was mostly on the left anterior calf just below the tibial tuberosity. His edema looks fairly well-controlled. He tells me he went to see his primary doctor today and had blood work ordered 2/11; once again he has several open areas on the left calf left tibial area. Most of these are small and appear to have healthy granulation. He does not have anything open on the right. The edema and control in his thighs is pretty good which is usually a good indication he has been using his pumps as requested. 2/18; he continues to have several small areas on the left calf and left tibial area. Most of these are small healthy granulation. We put him in his stocking on the right leg last week and he arrives with a superficial open area over the right upper tibia and a fairly large area on the right lateral tibia in similar condition. His edema control actually does not look too bad, he claims to be using his compression pumps twice a day 2/25. Continued small areas on the left calf and left tibial area. New areas especially on the right are identified just below the tibial tuberosity and on the right upper tibia itself. There are also areas of weeping edema fluid even without an obvious wound. He does not have a considerable degree of lymphedema but clearly there is more edema here than his skin can handle. He states he is using the pumps twice a day. We have an Unna boot on the right and 4 layer compression on the left. 3/3; he continues to have an area on the right lateral calf and right posterior calf just below the popliteal fossa. There is a fair amount of tenderness around the wound on the popliteal fossa but I did not see any  evidence of cellulitis, could just be that the wrap came down and rubbed in this area. ooHe does not have an open area on the left leg however there is an area on the left dorsal foot at the base of the third toe ooWe have been using silver alginate to all wound areas 3/10; he did not have an open area on his left leg last time he was here a week ago. T oday he arrives with a horizontal wound just below the tibial tuberosity and an area on the left lateral calf. He has intense erythema and tenderness in this area. The area is on the right lateral calf and right posterior calf better than last week. We have been using silver alginate as usual 3/18 - Patient returns with 3 small open areas on left calf, and 1 small open area on right calf, the skin looks ok with no significant erythema, he continues the UNA boot on right and 4 layer compression on left. The right lateral calf wound is closed , the right posterior is small area. we will continue silver alginate to the areas. 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 He is been tolerating the dressing changes without complication. Fortunately she has made excellent improvement in regard to the overall status of his wounds. Unfortunately every time we cease wrapping him he ends up reopening in causing more significant issues at that point. Again I'm unsure of the best direction to take although I think the lymphedema clinic may be appropriate for him. 02/03/19 on evaluation today patient appears to be doing well in regard to the wounds that we saw him for last week unfortunately he has a new area on the proximal portion of his right medial/posterior lower extremity where the wrap somewhat slowed down and caused swelling and a blister to rub and  open. Unfortunately this is the only opening that he has on either leg at this point. 02/17/19 on evaluation today patient's bilateral lower extremities appear to be doing well. He still completely healed in regard to the left lower extremity. In regard to the right lower extremity the area where the wrap and slid down and caused the blister still seems to be slightly open although this is dramatically better than during the last evaluation two weeks ago. I'm very pleased with the way this stands overall. 03/03/19 on evaluation today patient appears to be doing well in regard to his right lower extremity in general although he did have a new blister open this does not appear to be showing any evidence of active infection at this time. Fortunately there's No fevers, chills, nausea, or vomiting noted at this time. Overall I feel like he is making good progress it does feel like that the right leg will we perform the [employer] seems to do with a bit better than three layer wrap on the left which slid down on him. We may switch to doing bilateral in the book wraps. 5/4; I have not seen Mr. Strength in quite some time. According to our case manager he did not have an open wound on his left leg last week. He had 1 remaining wound on the right posterior medial calf. He arrives today with multiple openings on the left leg probably were blisters and/or wrap injuries from Unna boots. I do not think the Unna boot's will provide adequate compression on the left. I am also not clear about the frequency he is using the compression pumps. 03/17/19 on evaluation today patient appears to be doing excellent in regard to his lower extremities compared to last week's evaluation apparently. He had gotten significantly worse last week which is unfortunate. The [employer] wrap on the left did not seem to do very well for him at all and in fact it didn't control his swelling significantly enough he had an additional outbreak.  Subsequently we go back to the four layer compression wrap on the left. This is good news. At least in that he is doing better and the wound seem to be killing him. He still has not heard anything from the lymphedema clinic. 03/24/19 on evaluation today patient actually appears to be doing much better in regard to his bilateral lower [place] as compared to last week when I saw him. Fortunately there's no signs of active infection at this time. He has been tolerating the dressing changes without complication. Overall I'm extremely pleased with the progress and appearance in general. 04/07/19 on evaluation today patient appears to be doing well in regard to his bilateral lower extremities. His swelling is significantly  down from where it was previous. With that being said he does have a couple blisters still open at this point but fortunately nothing that seems to be too severe and again the majority of the larger openings has healed at this time. 04/14/19 on evaluation today patient actually appears to be doing quite well in regard to his bilateral lower extremities in fact I'm not even sure there's anything significantly open at this time at any site. Nonetheless he did have some trouble with these wraps where they are somewhat irritating him secondary to the fact that he has noted that the graph wasn't too close down to the end of this foot in a little bit short as well up to his knee. Otherwise things seem to be doing quite well. 04/21/19 upon evaluation today patient's wound bed actually showed evidence of being completely healed in regard to both lower extremities which is excellent news. There does not appear to be any signs of active infection which is also good news. I'm very pleased in this regard. No fevers, chills, nausea, or vomiting noted at this time. 04/28/19 on evaluation today patient appears to be doing a little bit worse in regard to both lower extremities on the left mainly due to the  fact that when he went infection disease the wrap was not wrapped quite high enough he developed a blister above this. On the right he is a small open area of nothing too significant but again this is continuing to give him some trouble he has been were in the Velcro compression that he has at home. 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 He's been tolerating the dressing changes without complication. Fortunately there's no signs of active infection at this time. No fevers, chills, nausea, or vomiting noted at this time. We have been trying to get an appointment with her lymphedema clinic in [facility] but unfortunately nobody can get them on phone with not been able to even fax information over the patient likewise is not been able to get in touch with them. Overall I'm not sure exactly what's going on here with to reach out again today. 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 Still having a lot of drainage unfortunately. He tells me especially in the left but even on the right. There's no signs of active infection which is good news we've been using so ratcheted up to this point. 05/19/19 on evaluation today patient actually appears to be doing quite well with regard to his left lower extremity which is great news. Fortunately in regard to the right lower extremity has an issues with his wrap and he subsequently did remove this from what I'm understanding. Nonetheless long story short is what he had rewrapped once he removed it subsequently had maggots underneath this wrap whenever he came in for evaluation today. With that being said they were obviously completely cleaned away by the nursing staff. The visit today which is excellent news. However he does appear to potentially have some infection around the right ankle region where  the maggots were located as well. He will likely require anabiotic therapy today. 05/26/19 on evaluation today patient actually appears to be doing much better in regard to his bilateral lower extremities. I feel like the infection is under much better control. With that being said there were maggots noted when the  wrap was removed yet again today. Again this could have potentially been left over from previous although at this time there does not appear to be any signs of significant drainage there was obviously on the wrap some drainage as well this contracted gnats or otherwise. Either way I do not see anything that appears to be doing worse in my pinion and in fact I think his drainage has slowed down quite significantly likely mainly due to the fact to his infection being under better control. 06/02/2019 on evaluation today patient actually appears to be doing well with regard to his bilateral lower extremities there is no signs of active infection at this time which is great news. With that being said he does have several open areas more so on the right than the left but nonetheless these are all significantly better than previously noted. 06/09/2019 on evaluation today patient actually appears to be doing well. His wrap stayed up and he did not cause any problems he had more drainage on the right compared to the left but overall I do not see any major issues at this time which is great news. 06/16/2019 on evaluation today patient appears to be doing excellent with regard to his lower extremities the only area that is open is a new blister that can have opened as of today on the medial ankle on the left. Other than this he really seems to be doing great I see no major issues at this point. 06/23/2019 on evaluation today patient appears to be doing quite well with regard to his bilateral lower extremities. In fact he actually appears to be almost completely healed there is a small area of weeping noted  of the right lower extremity just above the ankle. Nonetheless fortunately there is no signs of active infection at this time which is good news. No fevers, chills, nausea, vomiting, or diarrhea. 8/24; the patient arrived for a nurse visit today but complained of very significant pain in the left leg and therefore I was asked to look at this. Noted that he did not have an open area on the left leg last week nevertheless this was wrapped. The patient states that he is not been able to put his compression pumps on the left leg because of the discomfort. He has not been systemically unwell 06/30/2019 on evaluation today patient unfortunately despite being excellent last week is doing much worse with regard to his left lower extremity today. In fact he had to come in for a nurse on Monday where his left leg had to be rewrapped due to excessive weeping Dr. Leanord Hawking placed him on doxycycline at that point. Fortunately there is no signs of active infection Systemically at this time which is good news. 07/07/2019 in regard to the patient's wounds today he actually seems to be doing well with his right lower extremity there really is nothing open or draining at this point this is great news. Unfortunately the left lower extremity is given him additional trouble at this time. There does not appear to be any signs of active infection nonetheless he does have a lot of edema and swelling noted at this point as well as blistering all of which has led to a much more poor appearing leg at this time compared to where it was 2 weeks ago when it was almost completely healed. Obviously this is a little discouraging for the patient. He is try to contact the lymphedema clinic in [place] he has not been able to  get through to them. 07/14/2019 on evaluation today patient actually appears to be doing slightly better with regard to his left lower extremity ulcers. Overall I do feel like at least at the top of the wrap that we  have been placing this area has healed quite nicely and looks much better. The remainder of the leg is showing signs of improvement. Unfortunately in the thigh area he still has an open region on the left and again on the right he has been utilizing just a Band-Aid on an area that also opened on the thigh. Again this is an area that were not able to wrap although we did do an Ace wrap to provide some compression that something that obviously is a little less effective than the compression wraps we have been using on the lower portion of the leg. He does have an appointment with the lymphedema clinic in [facility] on Friday. 07/21/2019 on evaluation today patient appears to be doing better with regard to his lower extremity ulcers. He has been tolerating the dressing changes without complication. Fortunately there is no signs of active infection at this time. No fevers, chills, nausea, vomiting, or diarrhea. I did receive the paperwork from the physical therapist at the lymphedema clinic in [place]. Subsequently I signed off on that this morning and sent that back to him for further progression with the treatment plan. 07/28/2019 on evaluation today patient appears to be doing very well with regard to his right lower extremity where I do not see any open wounds at this point. Fortunately he is feeling great as far as that is concerned as well. In regard to the left lower extremity he has been having issues with still several areas of weeping and edema although the upper leg is doing better his lower leg still I think is going require the compression wrap at this time. No fevers, chills, nausea, vomiting, or diarrhea. 08/04/2019 on evaluation today patient unfortunately is having new wounds on the right lower extremity. Again we have been using Unna boot wrap on that side. We switched him to using his juxta lite wrap at home. With that being said he tells me he has been using it although his legs  extremely swollen and to be honest really does not appear that he has been. I cannot know that for sure however. Nonetheless he has multiple new wounds on the right lower extremity at this time. Obviously we will have to see about getting this rewrapped for him today. 08/11/2019 on evaluation today patient appears to be doing fairly well with regard to his wounds. He has been tolerating the dressing changes including the compression wraps without complication. He still has a lot of edema in his upper thigh regions bilaterally he is supposed to be seeing the lymphedema clinic on the 15th of this month once his wraps arrive for the upper part of his legs. 08/18/2019 on evaluation today patient appears to be doing well with regard to his bilateral lower extremities at this point. He has been tolerating the dressing changes without complication. Fortunately there is no signs of active infection which is also good news. He does have a couple weeping areas on the first and second toe of the right foot he also has just a small area on the left foot upper leg and a small area on the left lower leg but overall he is doing quite well in my opinion. He is supposed to be getting his wraps shortly in fact tomorrow  and then subsequently is seeing the lymphedema clinic next Wednesday on the 21st. Of note he is also leaving on the 25th to go on vacation for a week to the beach. For that reason and since there is some uncertainty about what there can be doing at lymphedema clinic next Wednesday I am get a make an appointment for next Friday here for [place] to see what we need to do for him prior to him leaving for vacation. 10/23; patient arrives in considerable pain predominantly in the upper posterior calf just distal to the popliteal fossa also in the wound anteriorly above the major wound. This is probably cellulitis and he has had this recurrently in the past. He has no open wound on the right side and he has had an  Radio broadcast assistant in that area. Finally I note that he has an area on the left posterior calf which by enlarge is mostly epithelialized. This protrudes beyond the borders of the surrounding skin in the setting of dry scaly skin and lymphedema. The patient is leaving for [facility] on Sunday. Per his longstanding pattern, he will not take his compression pumps with him predominantly out of fear that they will be stolen. He therefore asked that we put a Unna boot back on the right leg. He will also contact the wound care center in [facility] to see if they can change his dressing in the mid week. 11/3; patient returned from his vacation to [facility]. He was seen on 1 occasion at their wound care center. They did a 2 layer compression system as they did not have our 4-layer wrap. I am not completely certain what they put on the wounds. They did not change the Unna boot on the right. The patient is also seeing a lymphedema specialist physical therapist in [place]. It appears that he has some compression sleeve for his thighs which indeed look quite a bit better than I am used to seeing. He pumps over these with his external compression pumps. 11/10; the patient has a new wound on the right medial thigh otherwise there is no open areas on the right. He has an area on the left leg posteriorly anteriorly and medially and an area over the left second toe. We have been using silver alginate. He thinks the injury on his thigh is secondary to friction from the compression sleeve he has. 11/17; the patient has a new wound on the right medial thigh last week. He thinks this is because he did not have a underlying stocking for his thigh juxta lite apparatus. He now has this. The area is fairly large and somewhat angry but I do not think he has underlying cellulitis. ooHe has a intact blister on the right anterior tibial area. ooSmall wound on the right great toe dorsally ooSmall area on the medial left  calf. 11/30; the patient does not have any open areas on his right leg and we did not take his juxta lite stocking off. However he states that on Friday his compression wrap fell down lodging around his upper mid calf area. As usual this creates a lot of problems for him. He called urgently today to be seen for a nurse visit however the nurse visit turned into a provider visit because of extreme erythema and pain in the left anterior tibia extending laterally and posteriorly. The area that is problematic is extensive 10/06/2019 upon evaluation today patient actually appears to be doing poorly in regard to his left lower  extremity. He Dr. Leanord Hawking did place him on doxycycline this past Monday apparently due to the fact that he was doing much worse in regard to this left leg. Fortunately the doxycycline does seem to be helping. Unfortunately we are still having a very difficult time getting his edema under any type of control in order to anticipate discharge at some point. The only way were really able to control his lymphedema really is with compression wraps and that has only even seemingly temporary. He has been seeing a lymphedema clinic they are trying to help in this regard but still this has been somewhat frustrating in general for the patient. 10/13/19 on evaluation today patient appears to be doing excellent with regard to his right lower extremity as far as the wounds are concerned. His swelling is still quite extensive unfortunately. He is still having a lot of drainage from the thigh areas bilaterally which is unfortunate. He's been going to lymphedema clinic but again he still really does not have this edema under control as far as his lower extremities are concern. With regard to his left lower extremity this seems to be improving and I do believe the doxycycline has been of benefit for him. He is about to complete the doxycycline. 10/20/2019 on evaluation today patient appears to be doing  poorly in regard to his bilateral lower extremities. More in the right thigh he has a lot of irritation at this site unfortunately. In regard to the left lower extremity the wrap was not quite as high it appears and does seem to have caused him some trouble as well. Fortunately there is no evidence of systemic infection though he does have some blue-green drainage which has me concerned for the possibility of Pseudomonas. He tells me he is previously taking Cipro without complications and he really does not care for Levaquin however due to some of the side effects he has. He is not allergic to any medications specifically antibiotics that were aware of. 10/27/2019 on evaluation today patient actually does appear to be for the most part doing better when compared to last week's evaluation. With that being said he still has multiple open wounds over the bilateral lower extremities. He actually forgot to start taking the Cipro and states that he still has the whole bottle. He does have several new blisters on left lower extremity today I think I would recommend he go ahead and take the Cipro based on what I am seeing at this point. 12/30-Patient comes at 1 week visit, 4 layer compression wraps on the left and Unna boot on the right, primary dressing Xtrasorb and silver alginate. Patient is taking his Cipro and has a few more days left probably 5-6, and the legs are doing better. He states he is using his compressions devices which I believe he has 11/10/2019 on evaluation today patient actually appears to be much better than last time I saw him 2 weeks ago. His wounds are significantly improved and overall I am very pleased in this regard. Fortunately there is no signs of active infection at this time. He is just a couple of days away from completing Cipro. Overall his edema is much better he has been using his lymphedema pumps which I think is also helping at this point. 11/17/2019 on evaluation today  patient appears to be doing excellent in regard to his wounds in general. His legs are swollen but not nearly as much as they have been in the past. Fortunately he is tolerating  the compression wraps without complication. No fevers, chills, nausea, vomiting, or diarrhea. He does have some erythema however in the distal portion of his right lower extremity specifically around the forefoot and toes there is a little bit of warmth here as well. 11/24/2019 on evaluation today patient appears to be doing well with regard to his right lower extremity I really do not see any open wounds at this point. His left lower extremity does have several open areas and his right medial thigh also is open. Other than this however overall the patient seems to be making good progress and I am very pleased at this point. 12/01/2019 on evaluation today patient appears to be doing poorly at this point in regard to his left lower extremity has several new blisters despite the fact that we have him in compression wraps. In fact he had a 4-layer compression wrap, his upper thigh wrapped from lymphedema clinic, and a juxta light over top of the 4 layer compression wrap the lymphedema clinic applied and despite all this he still develop blisters underneath. Obviously this does have me concerned about the fact that unfortunately despite what we are doing to try to get wounds healed he continues to have new areas arise I do not think he is ever good to be at the point where he can realistically just use wraps at home to keep things under control. Typically when we heal him it takes about 1-2 days before he is back in the clinic with severe breakdown and blistering of his lower extremities bilaterally. This is happened numerous times in the past. Unfortunately I think that we may need some help as far as overall fluid overload to kind of limit what we are seeing and get things under better control. 12/08/2019 on evaluation today patient  presents for follow-up concerning his ongoing bilateral lower extremity edema. Unfortunately he is still having quite a bit of swelling the compression wraps are controlling this to some degree but he did see Dr. [name] his cardiologist I do have that available for review today as far as the appointment was concerned that was on 12/06/2019. Obviously that she has been 2 days ago. The patient states that he is only been taking the Lasix 80 mg 1 time a day he had told me previously he was taking this twice a day. Nonetheless Dr. [name] recommended this be up to 80 mg 2 times a day for the patient as he did appear to be fluid overloaded. With that being said the patient states he did this yesterday and he was unable to go anywhere or do anything due to the fact that he was constantly having to urinate. Nonetheless I think that this is still good to be something that is important for him as far as trying to get his edema under control at all things that he is going to be able to just expect his wounds to get under control and things to be better without going through at least a period of time where he is trying to stabilize his fluid management in general and I think increasing the Lasix is likely the first step here. It was also mentioned the possibility that the patient may require metolazone. With that being said he wanted to have the patient take Lasix twice a day first and then reevaluating 2 months to see where things stand. 12/15/2019 upon evaluation today patient appears to be doing regard to his legs although his toes are showing some signs of  weeping especially on the left at this point to some degree on the right. There does not appear to be any signs of active infection and overall I do feel like the compression wraps are doing well for him but he has not been able to take the Lasix at home and the increased dose that Dr. [name] recommended. He tells me that just not go to be feasible for him.  Nonetheless I think in this case he should probably send a message to Dr. [name] in order to discuss options from the standpoint of possible admission to get the fluid off or otherwise going forward. 12/22/2019 upon evaluation today patient appears to be doing fairly well with regard to his lower extremities at this point. In fact he would be doing excellent if it was not for the fact that his right anterior thigh apparently had an allergic reaction to adhesive tape that he used. The wound itself that we have been monitoring actually appears to be healed. There is a lot of irritation at this point. 12/29/2019 upon evaluation today patient appears to be doing well in regard to his lower extremities. His left medial thigh is open and somewhat draining today but this is the only region that is open the right has done much better with the treatment utilizing the steroid cream that I prescribed for him last week. Overall I am pleased in that regard. Fortunately there is no signs of active infection at this time. No fevers, chills, nausea, vomiting, or diarrhea. 01/05/2020 upon evaluation today patient appears to be doing more poorly in regard to his right lower extremity at this point upon evaluation today. Unfortunately he continues to have issues in this regard and I think the biggest issue is controlling his edema. This obviously is not very well controlled at this point is been recommended that he use the Lasix twice a day but he has not been able to do that. Unfortunately I think this is leading to an issue where honestly he is not really able to effectively control his edema and therefore the wounds really are not doing significantly better. I do not think that he is going to be able to keep things under good control unless he is able to control his edema much better. I discussed this again in great detail with him today. 01/12/2020 good news is patient actually appears to be doing quite well today at this  point. He does have an appointment with lymphedema clinic tomorrow. His legs appear healed and the toe on the left is almost completely healed. In general I am very pleased with how things stand at this point. 01/19/2020 upon evaluation today patient appears to actually be doing well in regard to his lower extremities there is nothing open at this point. Fortunately he has done extremely well more recently. Has been seeing lymphedema clinic as well. With that being said he has Velcro wraps for his lower legs as well as his upper legs. The only wound really is on his toe which is the right great toe and this is barely anything even there. With all that being said I think it is good to be appropriate today to go ahead and switch him over to the Velcro compression wraps. 01/26/2020 upon evaluation today patient appears to be doing worse with regard to his lower extremities after last week switch him to Velcro compression wraps. Unfortunately he lasted less than 24 hours he did not have the sock portion of his Velcro wrap  on the left leg and subsequently developed a blister underneath the Velcro portion. Obviously this is not good and not what we were looking for at this point. He states the lymphedema clinic did tell him to wear the wrap for 23 hours and take him off for 1 I am okay with that plan but again right now we got a get things back under control again he may have some cellulitis noted as well. 02/02/2020 upon evaluation today patient unfortunately appears to have several areas of blistering on his bilateral lower extremities today mainly on the feet. His legs do seem to be doing somewhat better which is good news. Fortunately there is no evidence of active infection at this time. No fevers, chills, nausea, vomiting, or diarrhea. 02/16/2020 upon evaluation today patient appears to be doing well at this time with regard to his legs. He has a couple weeping areas on his toes but for the most part  everything is doing better and does appear to be sealed up on his legs which is excellent news. We can continue with wrapping him at this point as he had every time we discontinue the wraps he just breaks out with new wounds. There is really no point in is going forward with this at this point. 03/08/2020 upon evaluation today patient actually appears to be doing quite well with regard to his lower extremity ulcers. He has just a very superficial and really almost nonexistent blister on the left lower extremity he has in general done very well with the compression wraps. With that being said I do not see any signs of infection at this time which is good news. 03/29/2020 upon evaluation today patient appears to be doing well with regard to his wounds currently except for where he had several new areas that opened up due to some of the wrap slipping and causing him trouble. He states he did not realize they had slipped. Nonetheless he has a 1 area on the right and 3 new areas on the left. Fortunately there is no signs of active infection at this time which is great news. 04/05/2020 upon evaluation today patient actually appears to be doing quite well in general in regard to his legs currently. Fortunately there is no signs of active infection at this time. No fevers, chills, nausea, vomiting, or diarrhea. He tells me next week that he will actually be seen in the lymphedema clinic on Thursday at 10 AM I see him on Wednesday next week. 04/12/2020 upon evaluation today patient appears to be doing very well with regard to his lower extremities bilaterally. In fact he does not appear to have any open wounds at this point which is good news. Fortunately there is no signs of active infection at this time. No fevers, chills, nausea, vomiting, or diarrhea. 04/19/2020 upon evaluation today patient appears to be doing well with regard to his wounds currently on the bilateral lower extremities. There does not appear to be  any signs of active infection at this time. Fortunately there is no evidence of systemic infection and overall very pleased at this point. Nonetheless after I held him out last week he literally had blisters the next morning already which swelled up with him being right back here in the clinic. Overall I think that he is just not can be able to be discharged with his legs the way they are he is much to volume overloaded as far as fluid is concerned and that was discussed with him  today of also discussed this but should try the clinic nurse manager as well as Dr. Leanord Hawking. 04/26/2020 upon evaluation today patient appears to be doing better with regard to his wounds currently. He is making some progress and overall swelling is under good control with the compression wraps. Fortunately there is no evidence of active infection at this time. 05/10/2020 on evaluation today patient appears to be doing overall well in regard to his lower extremities bilaterally. He is Tolerating the compression wraps without complication and with what we are seeing currently I feel like that he is making excellent progress. There is no signs of active infection at this time. 05/24/2020 upon evaluation today patient appears to be doing well in regard to his legs. The swelling is actually quite a bit down compared to where it has been in the past. Fortunately there is no sign of active infection at this time which is also good news. With that being said he does have several wounds on his toes that have opened up at this point. 05/31/2020 upon evaluation today patient appears to be doing well with regard to his legs bilaterally where he really has no significant fluid buildup at this point overall he seems to be doing quite well. Very pleased in this regard. With regard to his toes these also seem to be drying up which is excellent. We have continue to wrap him as every time we tried as a transition to the juxta light wraps things just  do not seem to get any better. 06/07/2020 upon evaluation today patient appears to be doing well with regard to his right leg at this point. Unfortunately left leg has a lot of blistering he tells me the wrap started to slide down on him when he tried to put his other Velcro wrap over top of it to help keep things in order but nonetheless still had some issues. 06/14/2020 on evaluation today patient appears to be doing well with regard to his lower extremity ulcers and foot ulcers at this point. I feel like everything is actually showing signs of improvement which is great news overall there is no signs of active infection at this time. No fevers, chills, nausea, vomiting, or diarrhea. 06/21/2020 on evaluation today patient actually appears to be doing okay in regard to his wounds in general. With that being said the biggest issue I see is on his right foot in particular the first and second toe seem to be doing a little worse due to the fact this is staying very wet. I think he is probably getting need to change out his dressings a couple times in between each week when we see him in regard to his toes in order to keep this drier based on the location and how this is proceeding. 06/28/2020 on evaluation today patient appears to be doing a little bit more poorly overall in regard to the appearance of the skin I am actually somewhat concerned about the possibility of him having a little bit of an infection here. We discussed the course of potentially giving him a doxycycline prescription which he is taken previously with good result. With that being said I do believe that this is potentially mild and at this point easily fixed. I just do not want anything to get any worse. 07/12/2020 upon evaluation today patient actually appears to be making some progress with regard to his legs which is great news there does not appear to be any evidence of active infection. Overall  very pleased with where things  stand. 07/26/2020 upon evaluation today patient appears to be doing well with regard to his leg ulcers and toe ulcers at this point. He has been tolerating the compression wraps without complication overall very pleased in this regard. 08/09/2020 upon evaluation today patient appears to be doing well with regard to his lower extremities bilaterally. Fortunately there is no signs of active infection overall I am pleased with where things stand. 08/23/2020 on evaluation today patient appears to be doing well with regard to his wound. He has been tolerating the dressing changes without complication. Fortunately there is no signs of active infection at this time. Overall his legs seem to be doing quite well which is great news and I am very pleased in that regard. No fevers, chills, nausea, vomiting, or diarrhea. 09/13/2020 upon evaluation today patient appears to be doing okay in regard to his lower extremities. He does have a fairly large blister on the right leg which I did remove the blister tissue from today so we can get this to dry out other than that however he seems to be doing quite well. There is no signs of active infection at this time. 09/27/2020 upon evaluation today patient appears to actually be doing some better in regard to his right leg. Fortunately signs of active infection at this time which is great news. No fevers, chills, nausea, vomiting, or diarrhea. 10/04/2020 upon evaluation today patient actually appears to be showing signs of improvement which is great news with regard to his leg ulcers. Fortunately there is no signs of active infection which is great news he is still taking the antibiotics currently. No fevers, chills, nausea, vomiting, or diarrhea. 10/18/2020 on evaluation today patient appears to be doing well with regard to his legs currently. He has been tolerating the dressing changes including the wraps without complication. Fortunately there is no signs of active  infection at this time. No fevers, chills, nausea, vomiting, or diarrhea. 10/25/2020 upon evaluation today patient appears to be doing decently well in regard to his wounds currently. He has been tolerating the dressing changes without complication. Overall I feel like he is making good progress albeit slow. Again this is something we can have to continue to wrap for some time to come most likely. 11/08/2020 upon evaluation today patient appears to be doing well with regard to his wounds currently. He has been tolerating the dressing changes without complication is not currently on any antibiotics and he does not appear to show any signs of infection. He does continue to have a lot of drainage on the right leg not too severe but nonetheless this is very scattered. On the left leg this is looking to be much improved overall. 11/15/2020 upon evaluation today patient appears to be doing better with regard to his legs bilaterally. Especially the right leg which was much more significant last week. There does not appear to be any signs of active infection which is great news. No fevers, chills, nausea, vomiting, or diarrhea. 11/23/2019 upon evaluation today patient appears to be doing poorly still in regard to his lower extremities bilaterally. Unfortunately his right leg in particular appears to be doing much more poorly there is no signs really of infection this is not warm to touch but he does have a lot of drainage and weeping unfortunately. With that reason I do believe that we may need to initiate some treatment here to try to help calm down some of the swelling of the  right leg. I think switching to a 4-layer compression wrap would be beneficial here. The patient is in agreement with giving this a try. 11/29/2020 upon evaluation today patient appears to be doing well currently in regard to his leg ulcers. I feel like the right leg is doing better he still has a lot of drainage but we do see some  improvement here. The 4-layer compression wrap I think was helpful. 12/06/2020 upon evaluation today patient appears to be doing well with regard to his legs. In fact they seem to be doing about the best I have seen up to this point. Fortunately there is no signs of active infection at this time. No fevers, chills, nausea, vomiting, or diarrhea. 12/20/2020 upon evaluation today patient appears to be doing well at this time in regard to his legs. He is not having any significant draining which is great news. Fortunately there is no signs of active infection at this time. No fevers, chills, nausea, vomiting, or diarrhea. 01/17/2021 upon evaluation today Carlos actually appears to be doing excellent in regard to his legs. He has a few areas again that come and go as far as his toes are concerned but overall this is doing quite well. 01/31/2021 upon evaluation today patient appears to be doing well with regard to his legs. Fortunately there does not appear to be any signs of active infection which is great news. Overall he is still having significant edema despite the compression wraps basically the 4-layer compression wrap to just keep things under control there is really not much room for play. 4/13: Mr. Baldree is a longstanding patient in our clinic and benefits greatly from weekly compression wraps. Today he has no complaints. He has been tolerating the wraps well. He states he is using the lymphedema pumps at home. 5/4; patient presents for follow-up of his chronic lymphedema/venous insufficiency ulcers. He comes weekly for compression wraps. He has no complaints today. He was unable to tolerate the Coflex 2 layer Last week so we will do the four press 4-layer compression. He has been using his lymphedema pumps daily. 5/18; patient presents for 2-week follow-up. He has no complaints or issues today. He has developed a new wound to the right foot on his fourth toe. He overall feels well and denies signs of  infection. 6/1; patient presents for 2-week follow-up. He has no complaints or issues today. He denies signs of infection. 04/18/2021 upon evaluation today patient appears to be doing well with regard to his legs bilaterally. Family open wound is actually on the toe of his left foot everything else is completely closed which is great news. In general I am extremely pleased with where things stand at this point. The patient is also happy that things are doing so well. 05/02/2021 upon evaluation today patient's legs actually appear to be doing quite well today. Fortunately there does not appear to be any signs of active infection which is great and overall I am extremely pleased with where he stands today. The patient does not appear to have any evidence of active infection at this time which is also great news. 05/09/2021 upon evaluation today patient appears to be doing a little bit more poorly in regard to his legs. Unfortunately he is having issues with some breakdown and a blood blister on the left leg this is due to I believe honestly to how it was wrapped last week. Fortunately there does not appear to be any signs of infection but nonetheless this is  still a concern to be honest. No fevers, chills, nausea, vomiting, or diarrhea. 05/16/2021 upon evaluation today patient appears to be doing significantly better as compared to last week. I am very pleased with where things stand today. There does not appear to be any signs of infection which is great news and overall very pleased with where we stand. No fevers, chills, nausea, vomiting, or diarrhea. 05/30/2021 upon evaluation today patient appears to be doing well with regard to his legs. He has been tolerating the dressing changes without complication. Fortunately there does not appear to be any signs of active infection which is great news and overall I am extremely pleased with where things stand today. No fevers, chills, nausea, vomiting, or  diarrhea. 06/20/2021 upon evaluation today patient actually appears to be making good progress today and very pleased with what we are seeing. I think his legs are really maintaining. As long as we continue wrapping he seems to be doing excellent in my opinion. Fortunately there is no signs of active infection at this time. No fevers, chills, nausea, vomiting, or diarrhea. 07/11/2021 upon evaluation today patient actually appears to be making excellent progress at this time. Fortunately there does not appear to be any evidence of active infection which is great news and overall I am extremely pleased with where things stand today. No fevers, chills, nausea, vomiting, or diarrhea. 07/25/2021 upon evaluation today patient appears to be doing well currently in regard to his lower extremities. He has been making good progress here and I do not see anything that is actually open significantly today this is great news. No fevers, chills, nausea, vomiting, or diarrhea. 08/08/2021 upon evaluation today patient appears to be doing well with regard to his wound. He has been tolerating the dressing changes without complication. With that being said unfortunately has a new area that opened up as far as his right posterior leg is concerned this was a blister he also has an area on the third toe right foot which also reopen. Fortunately there is no signs of active infection at this time which is great news. No fevers, chills, nausea, vomiting, or diarrhea. 10/17; patient came in today at his request initially for a nurse visit because it but out of concern for deterioration in both his lower legs and cellulitis I was asked to look at him. He comes in with increased swelling which he says started over the weekend he started to notice pain as well in his left medial ankle, right knee, left knee left dorsal foot. His wraps fell down contributing to some of this but he has not been using his compression pumps over the  weekend for reasons that are not really clear. He comes in with multiple new wounds including the right posterior leg, right third toe, right fourth toe, left second toe left medial ankle left dorsal ankle and right anterior lower leg 09/19/2021 upon evaluation today patient does seem to be making improvements in general which is great news. I do not see any evidence of infection currently he does have some hypergranulation of the anterior portion of his ankle on the left side this is going require some debridement to pare this down and then subsequently silver nitrate probably due to the amount of bleeding that he is probably going to experience. He is in agreement with this plan however. 09/26/2021 upon evaluation today although the patient's legs appear to be doing okay today unfortunately he did have maggots noted during the evaluation as well.  This is again quite unfortunate with the respect to the fact that he feels like that he got the wrap wet which was noted today he is not sure when and I think this is what led to the issue. Nonetheless we can try to see what we can do about silly things off so that this does not happen again in the future although he is can have to be diligent about taking care of his wraps as well. 10/03/2021 upon evaluation today patient appears to be doing okay in regard to his legs currently. He unfortunately has maggots again noted on the left foot. This obviously is becoming quite an issue to be perfectly honest. I do think that he needs to see what can be done at home to try to limit this exposure. Last week we had cleaned everything away so this is a new infestation as far as that is concerned. There is really not much that I can do to combat that I am trying to do what we can here in the clinic to wrap his foot and try to prevent any access of that again with knots there is a small this becomes very difficult. 10/10/2021 upon evaluation today patient appears to be  doing poorly in regard to his legs he is very swollen and unfortunately I think a big part of the issue here is simply that he is not taking his fluid pills appropriately stressed probably is not helping much at all either. He still having a lot of issues as far as getting moved and having to be out of his current living condition. With that being said he does have of note maggots noted on the right foot this time completely separate from what we have been seeing he tells me that he got his wraps wet again. Its mainly when it rains it sounds like that he goes out to his car in the area where he has to park there is a area that collects a fairly large puddle according to what he tells me today either way I explained to him which we have done before as well that if he gets his wraps wet he needs to let us know so we can get them changed out. He does not need to keep them on wet. 10/17/2021 upon evaluation today patient appears to be doing well in regard to his legs. In fact things are significantly better compared to where they were previous. Fortunately I see no evidence of infection currently which is great news. No fevers, chills, nausea, vomiting, or diarrhea. 10/24/2021 upon evaluation today patient appears to be doing awesome in regard to his legs in general. I think everything is showing signs of significant improvement which is great news. There is really only 1 opening on the second toe left foot everything else is closed. 11/07/2020 upon evaluation today patient appears to be doing well with regard to his legs in general he does have a blister on the left foot but otherwise things are doing overall quite well. 11/28/2021 upon evaluation today patient's legs appear to be doing decently well. The biggest issue we see is that he does have some blistering where the wrap seems to have slid down on the right leg near the top. This is just below the knee. That is good to be the biggest issue going forward  at this point in my opinion. Otherwise I really feel like he is doing pretty well across the board with the other wound locations previously  noted. Objective Constitutional Well-nourished and well-hydrated in no acute distress. Vitals Time Taken: 10:35 AM, Height: 70 in, Weight: 380.2 lbs, BMI: 54.5, Temperature: 97.9 F, Pulse: 83 bpm, Respiratory Rate: 20 breaths/min, Blood Pressure: 135/70 mmHg, Capillary Blood Glucose: 220 mg/dl. Respiratory normal breathing without difficulty. Psychiatric this patient is able to make decisions and demonstrates good insight into disease process. Alert and Oriented x 3. pleasant and cooperative. General Notes: Upon inspection patient's wounds again all appear to be doing quite well he has 2 blisters where the wrap slid down the right leg just below the knee but otherwise seems to be doing excellent. There does not appear to be any evidence of infection no erythema, warmth, or purulent drainage. Integumentary (Hair, Skin) Wound #204 status is Open. Original cause of wound was Gradually Appeared. The date acquired was: 09/26/2021. The wound has been in treatment 9 weeks. The wound is located on the Left T Second. The wound measures 0.6cm length x 1cm width x 0.1cm depth; 0.471cm^2 area and 0.047cm^3 volume. There is oe Fat Layer (Subcutaneous Tissue) exposed. There is no tunneling or undermining noted. There is a medium amount of serosanguineous drainage noted. The wound margin is distinct with the outline attached to the wound base. There is large (67-100%) pink, pale granulation within the wound bed. There is no necrotic tissue within the wound bed. Wound #210 status is Open. Original cause of wound was Blister. The date acquired was: 11/07/2021. The wound has been in treatment 3 weeks. The wound is located on the Left,Dorsal Foot. The wound measures 0.2cm length x 0.2cm width x 0.1cm depth; 0.031cm^2 area and 0.003cm^3 volume. There is no tunneling or  undermining noted. There is a small amount of serosanguineous drainage noted. The wound margin is distinct with the outline attached to the wound base. There is large (67-100%) pink, pale granulation within the wound bed. There is no necrotic tissue within the wound bed. 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 Post procedure Diagnosis Wound #: Same as Pre-Procedure There was a Four Layer Compression Therapy Procedure by Shawn Stall, RN. Post procedure Diagnosis Wound #: Same as Pre-Procedure Plan Follow-up Appointments: Return Appointment in 1 week. [facility] Schwartz Wednesday Bathing/ Shower/ Hygiene: May shower with protection but do not get wound dressing(s) wet. Edema Control - Lymphedema / SCD / Other: Lymphedema Pumps. Use Lymphedema pumps on leg(s) 2-3 times a day for 45-60 minutes. If wearing any wraps or hose, do not remove them. Continue exercising as instructed. Elevate legs to the level of the heart or above for 30 minutes daily and/or when sitting, a frequency of: - throughout the day Avoid standing for long periods of time. Exercise regularly Other Edema Control Orders/Instructions: - right leg 4 layer compression with unna boot to secure to upper portion of lower leg. apply  lotion to right leg. WOUND #204: - T Second Wound Laterality: Left  oe Prim Dressing: KerraCel Ag Gelling Fiber Dressing, 2x2 in (silver alginate) ary Discharge Instructions: Apply silver alginate to wound bed as instructed Secondary Dressing: Woven Gauze Sponges 2x2 in Discharge Instructions: Apply over primary dressing as directed. Secured With: Coban Self-Adherent Wrap 4x5 (in/yd) Discharge Instructions: Secure with Coban as directed. WOUND #210: - Foot Wound Laterality: Dorsal, Left Cleanser: Soap and Water 1 x Per Week/ Discharge Instructions: May shower and wash wound with dial antibacterial soap and water prior to dressing change. Cleanser: Wound Cleanser 1 x Per Week/ Discharge Instructions: Cleanse the wound with wound cleanser prior to applying a clean dressing using gauze sponges, not tissue or cotton balls. Peri-Wound Care: Zinc Oxide Ointment 30g tube 1 x Per Week/ Discharge Instructions: Apply Zinc Oxide to periwound with each dressing change Peri-Wound Care: Sween Lotion (Moisturizing lotion) 1 x Per Week/ Discharge Instructions: Apply moisturizing lotion as directed Prim Dressing: KerraCel Ag Gelling Fiber Dressing, 4x5 in (silver alginate) 1 x Per Week/ ary Discharge Instructions: Apply silver alginate to wound bed as instructed Secondary Dressing: ABD Pad, 8x10 1 x Per Week/ Discharge Instructions: Apply over primary dressing as directed. Com pression Wrap: FourPress (4 layer compression wrap) 1 x Per Week/ Discharge Instructions: Apply four layer compression apply unna boot first layer to upper portion of lower leg. 1. Would recommend currently that we continue with wound care measures as before and this includes the alginate to any open wound locations. 2. Also can recommend that we continue with the bilateral 4-layer compression wraps. He is not able to use his lymphedema pumps right now as he has been in a hotel. Hopefully he will be able to move into his new  apartment soon. 3. I am also can recommend that we have the patient continue to try to elevate his legs as much as possible he tells me he is able to do that in the bed in the motel. We will see patient back for reevaluation in 1 week here in the clinic. If anything worsens or changes patient will contact our office for additional recommendations. Electronic Signature(s) Signed: 11/28/2021 11:28:54 AM By: Lenda Kelp PA-C Entered By: Lenda Kelp on 11/28/2021 11:28:53 -------------------------------------------------------------------------------- SuperBill Details Patient Name: Date of Service: [name], [name] 11/28/2021 Medical Record Number: [id number] Patient Account Number: [id number] Date of Birth/Sex: Treating RN: 12-01-1950 (71 y.o. 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 Modifier: Quantity: 1 Physician Procedures :  CPT4 Code Description Modifier [id number] - WC PHYS LEVEL 4 - EST PT ICD-10 Diagnosis Description L97.521 Non-pressure chronic ulcer of other part of left foot limited to breakdown of skin  Z61.096 Non-pressure chronic ulcer of other part of left  lower leg with unspecified severity L97.519 Non-pressure chronic ulcer of other part of right foot with unspecified severity L97.812 Non-pressure chronic ulcer of other part of right lower leg with fat layer exposed Quantity: 1 Electronic Signature(s) Signed: 11/28/2021 11:29:20 AM By: Lenda Kelp PA-C Entered By: Lenda Kelp on 11/28/2021 11:29:14
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
## 2021-12-04 NOTE — Progress Notes (Signed)
[name], [name] ([id number]) Visit Report for 11/21/2021 SuperBill Details Patient Name: Date of Service: [place] [name], [name] 11/21/2021 Medical Record Number: [id number] Patient Account Number: [id number] Date of Birth/Sex: Treating RN: 08/09/1951 (71 y.o. 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 Electronic Signature(s) Signed: 11/21/2021 12:02:35 PM By: Shawn Stall RN, BSN Signed: 12/04/2021 5:17:21 PM By: Lenda Kelp PA-C Entered By: Shawn Stall on 11/21/2021 11:40:25

## 2021-12-05 ENCOUNTER — Other Ambulatory Visit: Payer: Self-pay

## 2021-12-05 ENCOUNTER — Encounter (HOSPITAL_BASED_OUTPATIENT_CLINIC_OR_DEPARTMENT_OTHER): Payer: Medicare Other | Attending: Physician Assistant | Admitting: Physician Assistant

## 2021-12-05 DIAGNOSIS — L97521 Non-pressure chronic ulcer of other part of left foot limited to breakdown of skin: Secondary | ICD-10-CM | POA: Diagnosis not present

## 2021-12-05 DIAGNOSIS — I890 Lymphedema, not elsewhere classified: Secondary | ICD-10-CM | POA: Insufficient documentation

## 2021-12-05 DIAGNOSIS — L97212 Non-pressure chronic ulcer of right calf with fat layer exposed: Secondary | ICD-10-CM | POA: Diagnosis not present

## 2021-12-05 DIAGNOSIS — E1151 Type 2 diabetes mellitus with diabetic peripheral angiopathy without gangrene: Secondary | ICD-10-CM | POA: Insufficient documentation

## 2021-12-05 DIAGNOSIS — E11621 Type 2 diabetes mellitus with foot ulcer: Secondary | ICD-10-CM | POA: Insufficient documentation

## 2021-12-05 DIAGNOSIS — L97211 Non-pressure chronic ulcer of right calf limited to breakdown of skin: Secondary | ICD-10-CM | POA: Insufficient documentation

## 2021-12-05 DIAGNOSIS — L97912 Non-pressure chronic ulcer of unspecified part of right lower leg with fat layer exposed: Secondary | ICD-10-CM | POA: Diagnosis not present

## 2021-12-05 DIAGNOSIS — L97221 Non-pressure chronic ulcer of left calf limited to breakdown of skin: Secondary | ICD-10-CM | POA: Diagnosis not present

## 2021-12-05 DIAGNOSIS — L97929 Non-pressure chronic ulcer of unspecified part of left lower leg with unspecified severity: Secondary | ICD-10-CM | POA: Diagnosis not present

## 2021-12-05 DIAGNOSIS — L97511 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 of skin: Secondary | ICD-10-CM | POA: Insufficient documentation

## 2021-12-05 DIAGNOSIS — I87333 Chronic venous hypertension (idiopathic) with ulcer and inflammation of bilateral lower extremity: Secondary | ICD-10-CM | POA: Insufficient documentation

## 2021-12-05 DIAGNOSIS — E11622 Type 2 diabetes mellitus with other skin ulcer: Secondary | ICD-10-CM | POA: Insufficient documentation

## 2021-12-05 DIAGNOSIS — L97522 Non-pressure chronic ulcer of other part of left foot with fat layer exposed: Secondary | ICD-10-CM | POA: Diagnosis not present

## 2021-12-05 DIAGNOSIS — L97812 Non-pressure chronic ulcer of other part of right lower leg with fat layer exposed: Secondary | ICD-10-CM | POA: Diagnosis not present

## 2021-12-05 NOTE — Progress Notes (Addendum)
BERTEL, VENARD ([id number]) Visit Report for 12/05/2021 Chief Complaint Document Details Patient Name: Date of Service: Kevin [name], [name] 12/05/2021 10:30 A M Medical Record Number: [id number] Patient Account Number: [id number] Date of Birth/Sex: Treating RN: [birth year] (71 y.o. [name] Primary Care Provider: Nicoletta Ba Other Clinician: Referring Provider: Treating Provider/Extender: Adele Dan in Treatment: 306 Information Obtained from: Patient Chief Complaint Patient is here for evaluation of venous/lymphedema ulcers Electronic Signature(s) Signed: 12/05/2021 11:00:11 AM By: Lenda Kelp PA-C Entered By: Lenda Kelp on 12/05/2021 11:00:10 -------------------------------------------------------------------------------- HPI Details Patient Name: Date of Service: [name], [name] 12/05/2021 10:30 A M Medical Record Number: [id number] Patient Account Number: [id number] Date of Birth/Sex: Treating RN: [birth year] (71 y.o. [name] Primary Care Provider: Nicoletta Ba Other Clinician: Referring Provider: Treating Provider/Extender: Adele Dan in Treatment: 306 History of Present Illness HPI Description: Referred by PCP for consultation. Patient has long standing history of BLE venous stasis, no prior ulcerations. At beginning of month, developed cellulitis and weeping. Received IM Rocephin followed by Keflex and resolved. Wears compression stocking, appr 6 months old. Not sure strength. No present drainage. 01/22/16 this is a patient who is a type II diabetic on insulin. 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 He tells me he religiously wears pressure stockings of uncertain strength. He was here with weeping edema about 8 months ago but did not have an open wound. Roughly a month ago he had a reopening on his bilateral legs. He is been using bandages and Neosporin. He does  not complain of pain. 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 Last BUN/creatinine I have is from 11/20/15 at 13 and 1.0 respectively 01/29/16; patient arrives today having tolerated the Profore wrap. He brought in his stockings and these are 18 mmHg stockings he bought from [place]. The compression here is likely inadequate. He does not complain of pain or excessive drainage she has no systemic symptoms. The wound on the right looks improved as does the one on the left although one on the left is more substantial with still tissue at risk below the actual wound area on the bilateral posterior calf 02/05/16; patient arrives with poor edema control. He states that we did put a 4 layer compression on it last week. No weight appear 5 this. 02/12/16; the area on the posterior right Has healed. The left Has a substantial wound that has necrotic surface eschar that requires a debridement with a curette. 02/16/16;the patient called or a Nurse visit secondary to increased swelling. He had been in earlier in the week with his right leg healed. He was transitioned to is on pressure stocking on the right leg with the only open wound on the left, a substantial area on the left posterior calf. Note he has a history of severe lower extremity edema, he has a history of chronic atrial fibrillation but not heart failure per my notes but I'll need to research this. He is not complaining of chest pain shortness of breath or orthopnea. The intake nurse noted blisters on the previously closed right leg 02/19/16; this is the patient's regular visit day. I see him on Friday with escalating edema new wounds on the right leg and clear signs of at least right ventricular heart failure. I increased his Lasix to 40 twice a  day. He is returning currently in follow-up. States he is noticed a decrease in that the edema 02/26/16  patient's legs have much less edema. There is nothing really open on the right leg. The left leg has improved condition of the large superficial wound on the posterior left leg 03/04/16; edema control is very much better. The patient's right leg wounds have healed. On the left leg he continues to have severe venous inflammation on the posterior aspect of the left leg. There is no tenderness and I don't think any of this is cellulitis. 03/11/16; patient's right leg is married healed and he is in his own stocking. The patient's left leg has deteriorated somewhat. There is a lot of erythema around the wound on the posterior left leg. There is also a significant rim of erythema posteriorly just above where the wrap would've ended there is a new wound in this location and a lot of tenderness. Can't rule out cellulitis in this area. 03/15/16; patient's right leg remains healed and he is in his own stocking. The patient's left leg is much better than last review. His major wound on the posterior aspect of his left Is almost fully epithelialized. He has 3 small injuries from the wraps. Really. Erythema seems a lot better on antibiotics 03/18/16; right leg remains healed and he is in his own stocking. The patient's left leg is much better. The area on the posterior aspect of the left calf is fully epithelialized. His 3 small injuries which were wrap injuries on the left are improved only one seems still open his erythema has resolved 03/25/16; patient's right leg remains healed and he is in his own stocking. There is no open area today on the left leg posterior leg is completely closed up. His wrap injuries at the superior aspect of his leg are also resolved. He looks as though he has some irritation on the dorsal ankle but this is fully epithelialized without evidence of infection. 03/28/16; we discharged this patient on Monday. Transitioned him into his own stocking. There were problems almost immediately with  uncontrolled swelling weeping edema multiple some of which have opened. He does not feel systemically unwell in particular no chest pain no shortness of breath and he does not feel 04/08/16; the edema is under better control with the Profore light wrap but he still has pitting edema. There is one large wound anteriorly 2 on the medial aspect of his left leg and 3 small areas on the superior posterior calf. Drainage is not excessive he is tolerating a Profore light well 04/15/16; put a Profore wrap on him last week. This is controlled is edema however he had a lot of pain on his left anterior foot most of his wounds are healed 04/22/16 once again the patient has denuded areas on the left anterior foot which he states are because his wrap slips up word. He saw his primary physician today is on Lasix 40 twice a day and states that he his weight is down 20 pounds over the last 3 months. 04/29/16: Much improved. left anterior foot much improved. He is now on Lasix 80 mg per day. Much improved edema control 05/06/16; I was hoping to be able to discharge him today however once again he has blisters at a low level of where the compression was placed last week mostly on his left lateral but also his left medial leg and a small area on the anterior part of the left foot. 05/09/16; apparently the patient  went home after his appointment on 7/4 later in the evening developing pain in his upper medial thigh together with subjective fever and chills although his temperature was not taken. The pain was so intense he felt he would probably have to call 911. However he then remembered that he had leftover doxycycline from a previous round of antibiotics and took these. By the next morning he felt a lot better. He called and spoke to one of our nurses and I approved doxycycline over the phone thinking that this was in relation to the wounds we had previously seen although they were definitely were not. The patient feels a lot  better old fever no chills he is still working. Blood sugars are reasonably controlled 05/13/16; patient is back in for review of his cellulitis on his anterior medial upper thigh. He is taking doxycycline this is a lot better. Culture I did of the nodular area on the dorsal aspect of his foot grew MRSA this also looks a lot better. 05/20/16; the patient is cellulitis on the medial upper thigh has resolved. All of his wound areas including the left anterior foot, areas on the medial aspect of the left calf and the lateral aspect of the calf at all resolved. He has a new blister on the left dorsal foot at the level of the fourth toe this was excised. No evidence of infection 05/27/16; patient continues to complain weeping edema. He has new blisterlike wounds on the left anterior lateral and posterior lateral calf at the top of his wrap levels. The area on his left anterior foot appears better. He is not complaining of fever, pain or pruritus in his feet. 05/30/16; the patient's blisters on his left anterior leg posterior calf all look improved. He did not increase the Lasix 100 mg as I suggested because he was going to run out of his 40 mg tablets. He is still having weeping edema of his toes 06/03/16; I renewed his Lasix at 80 mg once a day as he was about to run out when I last saw him. He is on 80 mg of Lasix now. I have asked him to cut down on the excessive amount of water he was drinking and asked him to drink according to his thirst mechanisms 06/12/2016 -- was seen 2 days ago and was supposed to wear his compression stockings at home but he is developed lymphedema and superficial blisters on the left lower extremity and hence came in for a review 06/24/16; the remaining wound is on his left anterior leg. He still has edema coming from between his toes. There is lymphedema here however his edema is generally better than when I last saw this. He has a history of atrial fibrillation but does not have a  known history of congestive heart failure nevertheless I think he probably has this at least on a diastolic basis. 07/01/16 I reviewed his echocardiogram from January 2017. This was essentially normal. He did not have LVH, EF of 55-60%. His right ventricular function was normal although he did have trivial tricuspid and pulmonic regurgitation. This is not audible on exam however. I increased his Lasix to do massive edema in his legs well above his knees I think in early July. He was also drinking an excessive amount of water at the time. 07/15/16; missed his appointment last week because of the Labor Day holiday on Monday. He could not get another appointment later in the week. Started to feel the wrap digging in superiorly  so we remove the top half and the bottom half of his wrap. He has extensive erythema and blistering superiorly in the left leg. Very tender. Very swollen. Edema in his foot with leaking edema fluid. He has not been systemically unwell 07/22/16; the area on the left leg laterally required some debridement. The medial wounds look more stable. His wrap injury wounds appear to have healed. Edema and his foot is better, weeping edema is also better. He tells me he is meeting with the supplier of the external compression pumps at work 08/05/16; the patient was on vacation last week in [facility]. His wrap is been on for an extended period of time. Also over the weekend he developed an extensive area of tender erythema across his anterior medial thigh. He took to doxycycline yesterday that he had leftover from a previous prescription. The patient complains of weeping edema coming out of his toes 08/08/16; I saw this patient on 10/2. He was tender across his anterior thigh. I put him on doxycycline. He returns today in follow-up. He does not have any open wounds on his lower leg, he still has edema weeping into his toes. 08/12/16; patient was seen back urgently today to follow-up for his  extensive left thigh cellulitis/erysipelas. He comes back with a lot less swelling and erythema pain is much better. I believe I gave him Augmentin and Cipro. His wrap was cut down as he stated a roll down his legs. He developed blistering above the level of the wrap that remained. He has 2 open blisters and 1 intact. 08/19/16; patient is been doing his primary doctor who is increased his Lasix from 40-80 once a day or 80 already has less edema. Cellulitis has remained improved in the left thigh. 2 open areas on the posterior left calf 08/26/16; he returns today having new open blisters on the anterior part of his left leg. He has his compression pumps but is not yet been shown how to use some vital representative from the supplier. 09/02/16 patient returns today with no open wounds on the left leg. Some maceration in his plantar toes 09/10/2016 -- Dr. Dellia Nims had recently discharged him on 09/02/2016 and he has come right back with redness swelling and some open ulcers on his left lower extremity. He says this was caused by trying to apply his compression stockings and he's been unable to use this and has not been able to use his lymphedema pumps. He had some doxycycline leftover and he has started on this a few days ago. 09/16/16; there are no open wounds on his leg on the left and no evidence of cellulitis. 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 He does not complain of symptoms here. I am not clear use using his external compression pumps. 09/23/16; I have not seen this patient in 2 weeks. He canceled his appointment 10 days ago as he was going on vacation. He tells me that on Monday he noticed a large area on his posterior left leg which is been draining copiously and is reopened into a large wound. He is been using ABDs and the external part of his juxtalite, according to our nurse this was not on properly. 10/07/16; Still a substantial  area on the posterior left leg. Using silver alginate 10/14/16; in general better although there is still open area which looks healthy. Still using silver alginate. He reminds me that this happen before he left for [facility]  [place]. T oday while he was showering in the morning. He had been using his juxtalite's 10/21/16; the area on his posterior left leg is fully epithelialized. However he arrives today with a large area of tender erythema in his medial and posterior left thigh just above the knee. I have marked the area. Once again he is reluctant to consider hospitalization. I treated him with oral antibiotics in the past for a similar situation with resolution I think with doxycycline however this area it seems more extensive to me. He is not complaining of fever but does have chills and says states he is thirsty. His blood sugar today was in the 140s at home 10/25/16 the area on his posterior left leg is fully epithelialized although there is still some weeping edema. The large area of tenderness and erythema in his medial and posterior left thigh is a lot less tender although there is still a lot of swelling in this thigh. He states he feels a lot better. He is on doxycycline and Augmentin that I started last week. This will continued until Tuesday, December 26. I have ordered a duplex ultrasound of the left thigh rule out DVT whether there is an abscess something that would need to be drained I would also like to know. 11/01/16; he still has weeping edema from a not fully epithelialized area on his left posterior calf. Most of the rest of this looks a lot better. He has completed his antibiotics. His thigh is a lot better. Duplex ultrasound did not show a DVT in the thigh 11/08/16; he comes in today with more Denuded surface epithelium from the posterior aspect of his calf. There is no real evidence of cellulitis. The superior aspect of his wrap appears to have put quite an indentation in his leg  just below the knee and this may have contributed. He does not complain of pain or fever. We have been using silver alginate as the primary dressing. The area of cellulitis in the right thigh has totally resolved. He has been using his compression stockings once a week 11/15/16; the patient arrives today with more loss of epithelium from the posterior aspect of his left calf. He now has a fairly substantial wound in this area. The reason behind this deterioration isn't exactly clear although his edema is not well controlled. He states he feels he is generally more swollen systemically. He is not complaining of chest pain shortness of breath fever. T me he has an appointment with his primary physician in early February. He is on 80 mg of oral ells Lasix a day. He claims compliance with the external compression pumps. He is not having any pain in his legs similar to what he has with his recurrent cellulitis 11/22/16; the patient arrives a follow-up of his large area on his left lateral calf. This looks somewhat better today. He came in earlier in the week for a dressing change since I saw him a week ago. He is not complaining of any pain no shortness of breath no chest pain 11/28/16; the patient arrives for follow-up of his large area on the left lateral calf this does not look better. In fact it is larger weeping edema. The surface of the wound does not look too bad. We have been using silver alginate although I'm not certain that this is a dressing issue. 12/05/16; again the patient follows up for a large wound on the left lateral and left posterior calf this does not look better. There  continues to be weeping edema necrotic surface tissue. More worrisome than this once again there is erythema below the wound involving the distal Achilles and heel suggestive of cellulitis. He is on his feet working most of the day of this is not going well. We are changing his dressing twice a week to facilitate the  drainage. 12/12/16; not much change in the overall dimensions of the large area on the left posterior calf. This is very inflamed looking. I gave him an. Doxycycline last week does not really seem to have helped. He found the wrap very painful indeed it seems to of dog into his legs superiorly and perhaps around the heel. He came in early today because the drainage had soaked through his dressings. 12/19/16- patient arrives for follow-up evaluation of his left lower extremity ulcers. He states that he is using his lymphedema pumps once daily when there is "no drainage". He admits to not using his lipedema pumps while under current treatment. His blood sugars have been consistently between 150-200. 12/26/16; the patient is not using his compression pumps at home because of the wetness on his feet. I've advised him that I think it's important for him to use this daily. He finds his feet too wet, he can put a plastic bag over his legs while he is in the pumps. Otherwise I think will be in a vicious circle. We are using silver alginate to the major area on his left posterior calf 01/02/17; the patient's posterior left leg has further of all into 3 open wounds. All of them covered with a necrotic surface. He claims to be using his compression pumps once a day. His edema control is marginal. Continue with silver alginate 01/10/17; the patient's left posterior leg actually looks somewhat better. There is less edema, less erythema. Still has 3 open areas covered with a necrotic surface requiring debridement. He claims to be using his compression pumps once a day his edema control is better 01/17/17; the patient's left posterior calf look better last week when I saw him and his wrap was changed 2 days ago. He has noted increasing pain in the left heel and arrives today with much larger wounds extensive erythema extending down into the entire heel area especially tender medially. He is not systemically unwell CBGs have  been controlled no fever. Our intake nurse showed me limegreen drainage on his AVD pads. 01/24/17; his usual this patient responds nicely to antibiotics last week giving him Levaquin for presumed Pseudomonas. The whole entire posterior part of his leg is much better much less inflamed and in the case of his Achilles heel area much less tender. He has also had some epithelialization posteriorly there are still open areas here and still draining but overall considerably better 01/31/17- He has continue to tolerate the compression wraps. he states that he continues to use the lymphedema pumps daily, and can increase to twice daily on the weekends. He is voicing no complaints or concerns regarding his LLE ulcers 02/07/17-he is here for follow-up evaluation. He states that he noted some erythema to the left medial and anterior thigh, which he states is new as of yesterday. He is concerned about recurrent cellulitis. He states his blood sugars have been slightly elevated, this morning in the 180s 02/14/17; he is here for follow-up evaluation. When he was last here there was erythema superiorly from his posterior wound in his anterior thigh. He was prescribed Levaquin however a culture of the wound surface grew MRSA over  the phone I changed him to doxycycline on Monday and things seem to be a lot better. 02/24/17; patient missed his appointment on Friday therefore we changed his nurse visit into a physician visit today. Still using silver alginate on the large area of the posterior left thigh. He isn't new area on the dorsal left second toe 03/03/17; actually better today although he admits he has not used his external compression pumps in the last 2 days or so because of work responsibilities over the weekend. 03/10/17; continued improvement. External compression pumps once a day almost all of his wounds have closed on the posterior left calf. Better edema control 03/17/17; in general improved. He still has 3 small  open areas on the lateral aspect of his left leg however most of the area on the posterior part of his leg is epithelialized. He has better edema control. He has an ABD pad under his stocking on the right anterior lower leg although he did not let us look at that today. 03/24/17; patient arrives back in clinic today with no open areas however there are areas on the posterior left calf and anterior left calf that are less than 100% epithelialized. His edema is well controlled in the left lower leg. There is some pitting edema probably lymphedema in the left upper thigh. He uses compression pumps at home once per day. I tried to get him to do this twice a day although he is very reticent. 04/01/2017 -- for the last 2 days he's had significant redness, tenderness and weeping and came in for an urgent visit today. 04/07/17; patient still has 6 more days of doxycycline. He was seen by Dr. Con Memos last Wednesday for cellulitis involving the posterior aspect, lateral aspect of his Involving his heel. For the most part he is better there is less erythema and less weeping. He has been on his feet for 12 hours 2 over the weekend. Using his compression pumps once a day 04/14/17 arrives today with continued improvement. Only one area on the posterior left calf that is not fully epithelialized. He has intense bilateral venous inflammation associated with his chronic venous insufficiency disease and secondary lymphedema. We have been using silver alginate to the left posterior calf wound In passing he tells [place] today that the right leg but we have not seen in quite some time has an open area on it but he doesn't want [place] to look at this today states he will show this to [place] next week. 04/21/17; there is no open area on his left leg although he still reports some weeping edema. He showed [place] his right leg today which is the first time we've seen this leg in a long time. He has a large area of open wound on the right leg  anteriorly healthy granulation. Quite a bit of swelling in the right leg and some degree of venous inflammation. He told us about the right leg in passing last week but states that deterioration in the right leg really only happened over the weekend 04/28/17; there is no open area on the left leg although there is an irritated part on the posterior which is like a wrap injury. The wound on the right leg which was new from last week at least to [place] is a lot better. 05/05/17; still no open area on the left leg. Patient is using his new compression stocking which seems to be doing a good job of controlling the edema. He states he is using his  compression pumps once per day. The right leg still has an open wound although it is better in terms of surface area. Required debridement. A lot of pain in the posterior right Achilles marked tenderness. Usually this type of presentation this patient gives concern for an active cellulitis 05/12/17; patient arrives today with his major wound from last week on the right lateral leg somewhat better. Still requiring debridement. He was using his compression stocking on the left leg however that is reopened with superficial wounds anteriorly he did not have an open wound on this leg previously. He is still using his juxta light's once daily at night. He cannot find the time to do this in the morning as he has to be at work by 7 AM 05/19/17; right lateral leg wound looks improved. No debridement required. The concerning area is on the left posterior leg which appears to almost have a subcutaneous hemorrhagic component to it. We've been using silver alginate to all the wounds 05/26/17; the right lateral leg wound continues to look improved. However the area on the left posterior calf is a tightly adherent surface. [place] using silver alginate. Because of the weeping edema in his legs there is very little good alternatives. 06/02/17; the patient left here last week looking quite  good. Major wound on the left posterior calf and a small one on the right lateral calf. Both of these look satisfactory. He tells me that by Wednesday he had noted increased pain in the left leg and drainage. He called on Thursday and Friday to get an appointment here but we were blocked. He did not go to urgent care or his primary physician. He thinks he had a fever on Thursday but did not actually take his temperature. He has not been using his compression pumps on the left leg because of pain. I advised him to go to the emergency room today for IV antibiotics for stents of left leg cellulitis but he has refused I have asked him to take 2 days off work to keep his leg elevated and he has refused this as well. In view of this I'm going to call him and Augmentin and doxycycline. 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 However after a detailed discussion regarding the risk benefits and alternatives he refuses to get admitted to the hospital. With no other recourse I will continue him on oral antibiotics as before and hopefully he'll have his infectious disease consultation this week. 06/16/2017 -- the patient was seen today by the nurse practitioner at infectious disease Ms. Dixon. 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 He has also been advise Lotrimin cream locally. she also had wise IV antibiotics for his condition if it flares up 06/23/17; patient arrives today with drainage bilaterally although the remaining wound on the left posterior calf after cleaning up today "highlighter  yellow drainage" did not look too bad. Unfortunately he has had breakdown on the right anterior leg [previously this leg had not been open and he is using a black stocking] he went to see infectious  disease and is been put on clindamycin 150 mg daily, I did not verify the dose although I'm not familiar with using clindamycin in this dosing range, perhaps for prophylaxisoo 06/27/17; I brought this patient back today to follow-up on the wound deterioration on the right lower leg together with surrounding cellulitis. I started him on doxycycline 4 days ago. This area looks better however he comes in today with intense cellulitis on the medial part of his left thigh. This is not have a wound in this area. Extremely tender. We've been using silver alginate to the wounds on the right lower leg left lower leg with bilateral 4 layer compression he is using his external compression pumps once a day 07/04/17; patient's left medial thigh cellulitis looks better. He has not been using his compression pumps as his insert said it was contraindicated with cellulitis. His right leg continues to make improvements all the wounds are still open. We only have one remaining wound on the left posterior calf. Using silver alginate to all open areas. He is on doxycycline which I started a week ago and should be finishing I gave him Augmentin after Thursday's visit for the severe cellulitis on the left medial thigh which fortunately looks better 07/14/17; the patient's left medial thigh cellulitis has resolved. The cellulitis in his right lower calf on the right also looks better. All of his wounds are stable to improved we've been using silver alginate he has completed the antibiotics I have given him. He has clindamycin 150 mg once a day prescribed by infectious disease for prophylaxis, I've advised him to start this now. We have been using bilateral Unna boots over silver alginate to the wound areas 07/21/17; the patient is  been to see infectious disease who noted his recurrent problems with cellulitis. He was not able to tolerate prophylactic clindamycin therefore he is on amoxicillin 500 twice a day. He also had a second daily dose of Lasix added By Dr. Melford Aase but he is not taking this. Nor is he being completely compliant with his compression pumps a especially not this week. He has 2 remaining wounds one on the right posterior lateral lower leg and one on the left posterior medial lower leg. 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 The patient has Unna boots bilaterally. 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 He has bilateral Unna boots for compression and silver alginate to his wounds. Arrives today with his legs looking as good as I have seen him in quite some time. Not surprisingly his wounds look better as well with improvement on the right lateral leg venous insufficiency wound and also the left medial leg. He is still using the compression pumps once a day 08/11/17; both legs appear to be doing better wounds on the right lateral and left medial legs look better. Skin on the right leg quite good. He is been using silver alginate as the primary dressing. I'm going to use Anasept gel calcium alginate and maintain all the secondary dressings 08/18/17; the patient continues to actually do quite well. The area on his right lateral leg is just about closed the left medial also looks better although it is still moist in this area. His edema is well controlled we have been using Anasept gel with calcium alginate  and the usual secondary dressings, 4 layer compression and once daily use of his compression pumps "always been able to manage 09/01/17; the patient continues to do reasonably well in  spite of his trip to [place]. The area on the right lateral leg is epithelialized. Left is much better but still open. He has more edema and more chronic erythema on the left leg [venous inflammation] 09/08/17; he arrives today with no open wound on the right lateral leg and decently controlled edema. Unfortunately his left leg is not nearly as in his good situation as last week.he apparently had increasing edema starting on Saturday. He edema soaked through into his foot so used a plastic bag to walk around his home. The area on the medial right leg which was his open area is about the same however he has lost surface epithelium on the left lateral which is new and he has significant pain in the Achilles area of the left foot. He is already on amoxicillin chronically for prophylaxis of cellulitis in the left leg 09/15/17; he is completed a week of doxycycline and the cellulitis in the left posterior leg and Achilles area is as usual improved. He still has a lot of edema and fluid soaking through his dressings. There is no open wound on the right leg. He saw infectious disease NP today 09/22/17;As usual 1 we transition him from our compression wraps to his stockings things did not go well. He has several small open areas on the right leg. He states this was caused by the compression wrap on his skin although he did not wear this with the stockings over them. He has several superficial areas on the left leg medially laterally posteriorly. He does not have any evidence of active cellulitis especially involving the left Achilles The patient is traveling from [facility] Saturday going to [facility]. He states he isn't attempting to get an appointment with a heel objects wound center there to change his dressings. I am not completely certain whether this will work 10/06/17; the patient came in on Friday for a nurse visit and the nurse reported that his legs actually look quite good. He arrives in clinic  today for his regular follow-up visit. He has a new wound on his left third toe over the PIP probably caused by friction with his footwear. He has small areas on the left leg and a very superficial but epithelialized area on the right anterior lateral lower leg. Other than that his legs look as good as I've seen him in quite some time. We have been using silver alginate Review of systems; no chest pain no shortness of breath other than this a 10 point review of systems negative 10/20/17; seen by Dr. Con Memos last week. He had taken some antibiotics [doxycycline] that he had left over. Dr. Con Memos thought he had candida infection and declined to give him further antibiotics. He has a small wound remaining on the right lateral leg several areas on the left leg including a larger area on the left posterior several left medial and anterior and a small wound on the left lateral. The area on the left dorsal third toe looks a lot better. ROS; Gen.; no fever, respiratory no cough no sputum Cardiac no chest pain other than this 10 point review of system is negative 10/30/17; patient arrives today having fallen in the bathtub 3 days ago. It took him a while to get up. He has pain and maceration in the wounds on his  left leg which have deteriorated. He has not been using his pumps he also has some maceration on the right lateral leg. 11/03/17; patient continues to have weeping edema especially in the left leg. This saturates his dressings which were just put on on 12/27. As usual the doxycycline seems to take care of the cellulitis on his lower leg. He is not complaining of fever, chills, or other systemic symptoms. He states his leg feels a lot better on the doxycycline I gave him empirically. He also apparently gets injections at his primary doctor's officeo Rocephin for cellulitis prophylaxis. I didn't ask him about his compression pump compliance today I think that's probably marginal. Arrives in the clinic  with all of his dressings primary and secondary macerated full of fluid and he has bilateral edema 11/10/17; the patient's right leg looks some better although there is still a cluster of wounds on the right lateral. The left leg is inflamed with almost circumferential skin loss medially to laterally although we are still maintaining anteriorly. He does not have overt cellulitis there is a lot of drainage. He is not using compression pumps. We have been using silver alginate to the wound areas, there are not a lot of options here 11/17/17; the patient's right leg continues to be stable although there is still open wounds, better than last week. The inflammation in the left leg is better. Still loss of surface layer epithelium especially posteriorly. There is no overt cellulitis in the amount of edema and his left leg is really quite good, tells me he is using his compression pumps once a day. 11/24/17; patient's right leg has a small superficial wound laterally this continues to improve. The inflammation in the left leg is still improving however we have continuous surface layer epithelial loss posteriorly. There is no overt cellulitis in the amount of edema in both legs is really quite good. He states he is using his compression pumps on the left leg once a day for 5 out of 7 days 12/01/17; very small superficial areas on the right lateral leg continue to improve. Edema control in both legs is better today. He has continued loss of surface epithelialization and left posterior calf although I think this is better. We have been using silver alginate with large number of absorptive secondary dressings 4 layer on the left Unna boot on the right at his request. He tells me he is using his compression pumps once a day 12/08/17; he has no open area on the right leg is edema control is good here. On the left leg however he has marked erythema and tenderness breakdown of skin. He has what appears to be a wrap injury  just distal to the popliteal fossa. 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 There was swelling erythema and breakdown in the right leg. He has much worse skin breakdown in the left leg as well multiple open areas medially and posteriorly as well as laterally. He tells me he has been using his compression pumps but tells me he feels that the drainage out of his leg is worse when he uses a compression pumps. T be fair to him he is been saying this o for a while however I  don't know that I have really been listening to this. I wonder if the compression pumps are working properly 12/22/17;. Once again he arrives with severe erythema, weeping edema from the left greater than right leg. Noncompliance with compression pumps. New this visit he is complaining of pain on the lateral aspect of the right leg and the medial aspect of his right thigh. He apparently saw his cardiologist Dr. Debara Pickett who was ordered an echocardiogram area and I think this is a step in the right direction 12/25/17; started his doxycycline Monday night. There is still intense erythema of the right leg especially in the anterior thigh although there is less tenderness. The erythema around the wound on the right lateral calf also is less tender. He still complaining of pain in the left heel. His wounds are about the same right lateral left medial left lateral. Superficial but certainly not close to closure. He denies being systemically unwell no fever chills no abdominal pain no diarrhea 12/29/17; back in follow-up of his extensive right calf and right thigh cellulitis. I added amoxicillin to cover possible doxycycline resistant strep. This seems to of done the  trick he is in much less pain there is much less erythema and swelling. He has his echocardiogram at 11:00 this morning. X-ray of the left heel was also negative. 01/05/18; the patient arrived with his edema under much better control. Now that he is retired he is able to use his compression pumps daily and sometimes twice a day per the patient. He has a wound on the right leg the lateral wound looks better. Area on the left leg also looks a lot better. He has no evidence of cellulitis in his bilateral thighs I had a quick peak at his echocardiogram. He is in normal ejection fraction and normal left ventricular function. He has moderate pulmonary hypertension moderately reduced right ventricular function. One would have to wonder about chronic sleep apnea although he says he doesn't snore. He'll review the echocardiogram with his cardiologist. 01/12/18; the patient arrives with the edema in both legs under exemplary control. He is using his compression pumps daily and sometimes twice daily. His wound on the right lateral leg is just about closed. He still has some weeping areas on the posterior left calf and lateral left calf although everything is just about closed here as well. I have spoken with Jeri Lager who is the patient's nurse practitioner and infectious disease. She was concerned that the patient had not understood that the parenteral penicillin injections he was receiving for cellulitis prophylaxis was actually benefiting him. I don't think the patient actually saw that I would tend to agree we were certainly dealing with less infections although he had a serious one last month. 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 He is healed. He'll be placed in juxtalite compression wraps and increase his lymphedema pumps to twice daily. We will follow up again next week to ensure there are no issues with the new regiment. 01/20/18-he is here for evaluation of  bilateral lower extremity weeping edema. Yesterday he was placed in compression wrap to the right lower extremity and compression stocking to left lower shrubbery. He states he uses lymphedema pumps last night and again this morning and noted a blister to the left lower extremity. On exam he was noted to have drainage to the right lower extremity. He will be placed in Unna boots bilaterally and follow-up next week 01/26/18; patient was actually discharged a  week ago to his own juxta light stockings only to return the next day with bilateral lower extremity weeping edema.he was placed in bilateral Unna boots. He arrives today with pain in the back of his left leg. There is no open area on the right leg however there is a linear/wrap injury on the left leg and weeping edema on the left leg posteriorly. I spoke with infectious disease about 10 days ago. They were disappointed that the patient elected to discontinue prophylactic intramuscular penicillin shots as they felt it was particularly beneficial in reducing the frequency of his cellulitis. I discussed this with the patient today. He does not share this view. He'll definitely need antibiotics today. Finally he is traveling to [place] and trauma leaving this Saturday and returning a week later and he does not travel with his pumps. He is going by car 01/30/18; patient was seen 4 days ago and brought back in today for review of cellulitis in the left leg posteriorly. I put him on amoxicillin this really hasn't helped as much as I might like. He is also worried because he is traveling to [facility] trauma by car. Finally we will be rewrapping him. There is no open area on the right leg over his left leg has multiple weeping areas as usual 02/09/18; The same wrap on for 10 days. He did not pick up the last doxycycline I prescribed for him. He apparently took 4 days worth he already had. There is nothing open on his right leg and the edema control is really  quite good. He's had damage in the left leg medially and laterally especially probably related to the prolonged use of Unna boots 02/12/18; the patient arrived in clinic today for a nurse visit/wrap change. He complained of a lot of pain in the left posterior calf. He is taking doxycycline that I previously prescribed for him. Unfortunately even though he used his stockings and apparently used to compression pumps twice a day he has weeping edema coming out of the lateral part of his right leg. This is coming from the lower anterior lateral skin area. 02/16/18; the patient has finished his doxycycline and will finish the amoxicillin 2 days. The area of cellulitis in the left calf posteriorly has resolved. He is no longer having any pain. He tells me he is using his compression pumps at least once a day sometimes twice. 02/23/18; the patient finished his doxycycline and Amoxil last week. On Friday he noticed a small erythematous circle about the size of a quarter on the left lower leg just above his ankle. This rapidly expanded and he now has erythema on the lateral and posterior part of the thigh. This is bright red. Also has an area on the dorsal foot just above his toes and a tender area just below the left popliteal fossa. He came off his prophylactic penicillin injections at his own insistence one or 2 months ago. This is obviously deteriorated since then 03/02/18; patient is on doxycycline and Amoxil. Culture I did last week of the weeping area on the back of his left calf grew group B strep. I have therefore renewed the amoxicillin 500 3 times a day for a further week. He has not been systemically unwell. Still complaining of an area of discomfort right under his left popliteal fossa. There is no open wound on the right leg. He tells me that he is using his pumps twice a day on most days 03/09/18; patient arrives in clinic today completing  his amoxicillin today. The cellulitis on his left leg is  better. Furthermore he tells me that he had intramuscular penicillin shots that his primary care office today. However he also states that the wrap on his right leg fell down shortly after leaving clinic last week. He developed a large blister that was present when he came in for a nurse visit later in the week and then he developed intense discomfort around this area.He tells me he is using his compression pumps 03/16/18; the patient has completed his doxycycline. The infectious part of this/cellulitis in the left heel area left popliteal area is a lot better. He has 2 open areas on the right calf. Still areas on the left calf but this is a lot better as well. 03/24/18; the patient arrives complaining of pain in the left popliteal area again. He thinks some of this is wrap injury. He has no open area on the right leg and really no open area on the left calf either except for the popliteal area. He claims to be compliant with the compression pumps 03/31/18; I gave him doxycycline last week because of cellulitis in the left popliteal area. This is a lot better although the surface epithelium is denuded off and response to this. He arrives today with uncontrolled edema in the right calf area as well as a fingernail injury in the right lateral calf. There is only a few open areas on the left 04/06/18; I gave him amoxicillin doxycycline over the last 2 weeks that the amoxicillin should be completing currently. He is not complaining of any pain or systemic symptoms. The only open areas see has is on the right lateral lower leg paradoxically I cannot see anything on the left lower leg. He tells me he is using his compression pumps twice a day on most days. Silver alginate to the wounds that are open under 4 layer compression 04/13/18; he completed antibiotics and has no new complaints. Using his compression pumps. Silver alginate that anything that's opened 04/20/18; he is using his compression pumps religiously.  Silver alginate 4 layer compression anything that's opened. He comes in today with no open wounds on the left leg but 3 on the right including a new one posteriorly. He has 2 on the right lateral and one on the right posterior. He likes Unna boots on the right leg for reasons that aren't really clear we had the usual 4 layer compression on the left. It may be necessary to move to the 4 layer compression on the right however for now I left them in the Unna boots 04/27/18; he is using his compression pumps at least once a day. He has still the wounds on the right lateral calf. The area right posteriorly has closed. He does not have an open wound on the left under 4 layer compression however on the dorsal left foot just proximal to the toes and the left third toe 2 small open areas were identified 05/11/18; he has not uses compression pumps. The areas on the right lateral calf have coalesced into one large wound necrotic surface. On the left side he has one small wound anteriorly however the edema is now weeping out of a large part of his left leg. He says he wasn't using his pumps because of the weeping fluid. I explained to him that this is the time he needs to pump more 05/18/18; patient states he is using his compression pumps twice a day. The area on the right lateral  large wound albeit superficial. On the left side he has innumerable number of small new wounds on the left calf particularly laterally but several anteriorly and medially. All these appear to have healthy granulated base these look like the remnants of blisters however they occurred under compression. The patient arrives in clinic today with his legs somewhat better. There is certainly less edema, less multiple open areas on the left calf and the right anterior leg looks somewhat better as well superficial and a little smaller. However he relates pain and erythema over the last 3-4 days in the thigh and I looked at this today. He has not  been systemically unwell no fever no chills no change in blood sugar values 05/25/18; comes in today in a better state. The severe cellulitis on his left leg seems better with the Keflex. Not as tender. He has not been systemically unwell Hard to find an open wound on the left lower leg using his compression pumps twice a day The confluent wounds on his right lateral calf somewhat better looking. These will ultimately need debridement I didn't do this today. 06/01/18; the severe cellulitis on the left anterior thigh has resolved and he is completed his Keflex. There is no open wound on the left leg however there is a superficial excoriation at the base of the third toe dorsally. Skin on the bottom of his left foot is macerated looking. The left the wounds on the lateral right leg actually looks some better although he did require debridement of the top half of this wound area with an open curet 06/09/18 on evaluation today patient appears to be doing poorly in regard to his right lower extremity in particular this appears to likely be infected he has very thick purulent discharge along with a bright green tent to the discharge. This makes me concerned about the possibility of pseudomonas. He's also having increased discomfort at this point on evaluation. Fortunately there does not appear to be any evidence of infection spreading to the other location at this time. 06/16/18 on evaluation today patient appears to actually be doing fairly well. His ulcer has actually diminished in size quite significantly at this point which is good news. Nonetheless he still does have some evidence of infection he did see infectious disease this morning before coming here for his appointment. I did review the results of their evaluation and their note today. They did actually have him discontinue the Cipro and initiate treatment with linezolid at this time. He is doing this for the next seven days and they recommended a  follow-up in four months with them. He is the keep a log of the need for intermittent antibiotic therapy between now and when he falls back up with infectious disease. This will help them gaze what exactly they need to do to try and help them out. 06/23/18; the patient arrives today with no open wounds on the left leg and left third toe healed. He is been using his compression pumps twice a day. On the right lateral leg he still has a sizable wound but this is a lot better than last time I saw this. 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 Has been using silver alginate under 4 layer compression 06/30/18; the only open wound he has is on the right lateral leg and this looks healthy. No debridement is required. We have been using silver  alginate. He does not have an open wound on the left leg. There is apparently some drainage from the dorsal proximal third toe on the left although I see no open wound here. 07/03/18 on evaluation today patient was actually here just for a nurse visit rapid change. However when he was here on Wednesday for his rat change due to having been healed on the left and then developing blisters we initiated the wrap again knowing that he would be back today for [place] to reevaluate and see were at. Unfortunately he has developed some cellulitis into the proximal portion of his right lower extremity even into the region of his thigh. 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 He was placed on one as what at that point. Nonetheless he is done with that and has been tolerating it well otherwise. Doxycycline which in the past really did not seem to be effective for him. Nonetheless I think the best option may be for [place] to definitely reinitiate the antibiotics for a longer period of time. 07/07/18; since I last saw this patient a week ago he has  had a difficult time. At that point he did not have an open wound on his left leg. We transitioned him into juxta light stockings. He was apparently in the clinic the next day with blisters on the left lateral and left medial lower calf. He also had weeping edema fluid. He was put back into a compression wrap. He was also in the clinic on Friday with intense erythema in his right thigh. Per the patient he was started on Bactrim however that didn't work at all in terms of relieving his pain and swelling. He has taken 3 doxycycline that he had left over from last time and that seems to of helped. He has blistering on the right thigh as well. 07/14/18; the erythema on his right thigh has gotten better with doxycycline that he is finishing. 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 Presumably this cellulitis in the thigh was not related to that although I think this is a bit concerning going forward. He still has an area on the right lateral calf the blister on the right medial calf just below the knee that was discussed above. On the left 2 small open areas left medial and left lateral. Edema control is adequate. He is using his compression pumps twice a day 07/20/18; continued improvement in the condition of both legs especially the edema in his bilateral thighs. He tells me he is been losing weight through a combination of diet and exercise. He is using his compression pumps twice a day. So overall she made to the remaining wounds 07/27/2018; continued improvement in condition of both legs. His edema is well controlled. The area on the right lateral leg is just about closed he had one blisters show up on the medial left upper calf. We have him in 4 layer compression. He is going on a 10-day trip to [place], [place] and [place]. He will be driving. He wants to wear Unna boots because of the lessening amount of constriction.  He will not use compression pumps while he is away 08/05/18 on evaluation today patient actually appears to be doing decently well all things considered in regard to his bilateral lower extremities. The worst ulcer is actually only posterior aspect of his left lower  extremity with a four layer compression wrap cut into his leg a couple weeks back. He did have a trip and actually had [employer] for the trip that he is worn since he was last here. Nonetheless he feels like the [employer] actually do better for him his swelling is up a little bit but he also with his trip was not taking his Lasix on a regular set schedule like he was supposed to be. He states that obviously the reason being that he cannot drive and keep going without having to urinate too frequently which makes it difficult. He did not have his pumps with him while he was away either which I think also maybe playing a role here too. 08/13/2018; the patient only has a small open wound on the right lateral calf which is a big improvement in the last month or 2. He also has the area posteriorly just below the posterior fossa on the left which I think was a wrap injury from several weeks ago. He has no current evidence of cellulitis. He tells me he is back into his compression pumps twice a day. He also tells me that while he was at the [facility] somebody stole a section of his extremitease stockings 08/20/2018; back in the clinic with a much improved state. He only has small areas on the right lateral mid calf which is just about healed. This was is more substantial area for quite a prolonged period of time. He has a small open area on the left anterior tibia. The area on the posterior calf just below the popliteal fossa is closed today. He is using his compression pumps twice a day 08/28/2018; patient has no open wound on the right leg. He has a smattering of open areas on the calf with some weeping lymphedema. More  problematically than that it looks as though his wraps of slipped down in his usual he has very angry upper area of edema just below the right medial knee and on the right lateral calf. He has no open area on his feet. The patient is traveling to [facility] next week. I will send him in an antibiotic. We will continue to wrap the right leg. We ordered extremitease stockings for him last week and I plan to transition the right leg to a stocking when he gets home which will be in 10 days time. As usual he is very reluctant to take his pumps with him when he travels 09/07/2018; patient returns from [facility]. He shows me a picture of his left leg in the mid part of his trip last week with intense fire engine erythema. The picture look bad enough I would have considered sending him to the hospital. Instead he went to the wound care center in [facility]. They did not prescribe him antibiotics but he did take some doxycycline he had leftover from a previous visit. I had given him trimethoprim sulfamethoxazole before he left this did not work according to the patient. This is resulted in some improvement fortunately. He comes back with a large wound on the left posterior calf. Smaller area on the left anterior tibia. Denuded blisters on the dorsal left foot over his toes. Does not have much in the way of wounds on the right leg although he does have a very tender area on the right posterior area just below the popliteal fossa also suggestive of infection. He promises me he is back on his pumps twice a  day 09/15/2018; the intense cellulitis in his left lower calf is a lot better. The wound area on the posterior left calf is also so better. However he has reasonably extensive wounds on the dorsal aspect of his second and third toes and the proximal foot just at the base of the toes. There is nothing open on the right leg 09/22/2018; the patient has excellent edema control in his legs  bilaterally. He is using his external compression pumps twice a day. He has no open area on the right leg and only the areas in the left foot dorsally second and third toe area on the left side. He does not have any signs of active cellulitis. 10/06/2018; the patient has good edema control bilaterally. He has no open wound on the right leg. There is a blister in the posterior aspect of his left calf that we had to deal with today. He is using his compression pumps twice a day. There is no signs of active cellulitis. We have been using silver alginate to the wound areas. He still has vulnerable areas on the base of his left first second toes dorsally He has a his extremities stockings and we are going to transition him today into the stocking on the right leg. He is cautioned that he will need to continue to use the compression pumps twice a day. If he notices uncontrolled edema in the right leg he may need to go to 3 times a day. 10/13/2018; the patient came in for a nurse check on Friday he has a large flaccid blister on the right medial calf just below the knee. We unroofed this. He has this and a new area underneath the posterior mid calf which was undoubtedly a blister as well. He also has several small areas on the right which is the area we put his extremities stocking on. 10/19/2018; the patient went to see infectious disease this morning I am not sure if that was a routine follow-up in any case the doxycycline I had given him was discontinued and started on linezolid. He has not started this. It is easy to look at his left calf and the inflammation and think this is cellulitis however he is very tender in the tissue just below the popliteal fossa and I have no doubt that there is infection going on here. He states the problem he is having is that with the compression pumps the edema goes down and then starts walking the wrap falls down. We will see if we can adhere this. He has 1 or 2 minuscule  open areas on the right still areas that are weeping on the posterior left calf, the base of his left second and third toes 10/26/18; back today in clinic with quite of skin breakdown in his left anterior leg. This may have been infection the area below the popliteal fossa seems a lot better however tremendous epithelial loss on the left anterior mid tibia area over quite inexpensive tissue. He has 2 blisters on the right side but no other open wound here. 10/29/2018; came in urgently to see [place] today and we worked him in for review. He states that the 4 layer compression on the right leg caused pain he had to cut it down to roughly his mid calf this caused swelling above the wrap and he has blisters and skin breakdown today. As a result of the pain he has not been using his pumps. Both legs are a lot more edematous and there  is a lot of weeping fluid. 11/02/18; arrives in clinic with continued difficulties in the right leg> left. Leg is swollen and painful. multiple skin blisters and new open areas especially laterally. He has not been using his pumps on the right leg. 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 He is not systemically unwell. 11/09/2018; the patient claims he is being compliant with his pumps. He is finished the doxycycline I gave him last week. Culture I did of the wound on the right lateral leg showed a few very resistant methicillin staph aureus. This was resistant to doxycycline. Nevertheless he states the pain in the leg is a lot better which makes me wonder if the cultured organism was not really what was causing the problem nevertheless this is a very dangerous organism to be culturing out of any wound. His right leg is still a lot larger than the left. He is using an Haematologist on this area, he blames a 4-layer compression for causing the original skin breakdown which I doubt is true however I cannot talk him out of it. We have been  using silver alginate to all of these areas which were initially blisters 11/16/2018; patient is being compliant with his external compression pumps at twice a day. Miraculously he arrives in clinic today with absolutely no open wounds. He has better edema control on the left where he has been using 4 layer compression versus wound of wounds on the right and I pointed this out to him. There is no inflammation in the skin in his lower legs which is also somewhat unusual for him. There is no open wounds on the dorsal left foot. He has extremitease stockings at home and I have asked him to bring these in next week. 11/25/18 patient's lower extremity on examination today on the left appears for the most part to be wound free. He does have an open wound on the lateral aspect of the right lower extremity but this is minimal compared to what I've seen in past. He does request that we go ahead and wrap the left leg as well even though there's nothing open just so hopefully it will not reopen in short order. 1/28; patient has superficial open wounds on the right lateral calf left anterior calf and left posterior calf. His edema control is adequate. 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 We have been using silver alginate as the primary dressing. He claims compliance with his compression pumps 2/4; patient has superficial open wounds on numerous areas of his left calf and again one on the left dorsal foot. The areas on the right lateral calf have healed. The cellulitis that I gave him doxycycline for last week is also resolved this was mostly on the left anterior calf just below the tibial tuberosity. His edema looks fairly well-controlled. He tells me he went to see his primary doctor today and had blood work ordered 2/11; once again he has several open areas on the left calf left tibial area. Most of these are small and  appear to have healthy granulation. He does not have anything open on the right. The edema and control in his thighs is pretty good which is usually a good indication he has been using his pumps as requested. 2/18; he continues to have several small areas on the left calf and left tibial area. Most of  these are small healthy granulation. We put him in his stocking on the right leg last week and he arrives with a superficial open area over the right upper tibia and a fairly large area on the right lateral tibia in similar condition. His edema control actually does not look too bad, he claims to be using his compression pumps twice a day 2/25. Continued small areas on the left calf and left tibial area. New areas especially on the right are identified just below the tibial tuberosity and on the right upper tibia itself. There are also areas of weeping edema fluid even without an obvious wound. He does not have a considerable degree of lymphedema but clearly there is more edema here than his skin can handle. He states he is using the pumps twice a day. We have an Unna boot on the right and 4 layer compression on the left. 3/3; he continues to have an area on the right lateral calf and right posterior calf just below the popliteal fossa. There is a fair amount of tenderness around the wound on the popliteal fossa but I did not see any evidence of cellulitis, could just be that the wrap came down and rubbed in this area. He does not have an open area on the left leg however there is an area on the left dorsal foot at the base of the third toe We have been using silver alginate to all wound areas 3/10; he did not have an open area on his left leg last time he was here a week ago. T oday he arrives with a horizontal wound just below the tibial tuberosity and an area on the left lateral calf. He has intense erythema and tenderness in this area. The area is on the right lateral calf and right posterior calf  better than last week. We have been using silver alginate as usual 3/18 - Patient returns with 3 small open areas on left calf, and 1 small open area on right calf, the skin looks ok with no significant erythema, he continues the UNA boot on right and 4 layer compression on left. The right lateral calf wound is closed , the right posterior is small area. we will continue silver alginate to the areas. 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 He is been tolerating the dressing changes without complication. Fortunately she has made excellent improvement in regard to the overall status of his wounds. Unfortunately every time we cease wrapping him he ends up reopening in causing more significant issues at that point. Again I'm unsure of the best direction to take although I think the lymphedema clinic may be appropriate for him. 02/03/19 on evaluation today patient appears to be doing well in regard to the wounds that we saw him for last week unfortunately he has a new area on the proximal portion of his right medial/posterior lower extremity where the wrap somewhat slowed down and caused swelling and a blister to rub and open. Unfortunately this is the only opening that he has on either leg at this point. 02/17/19 on evaluation today patient's bilateral lower extremities appear to be doing well. He still completely healed in regard to the left lower extremity. In regard to the right lower extremity the area where the wrap and slid down and caused the  blister still seems to be slightly open although this is dramatically better than during the last evaluation two weeks ago. I'm very pleased with the way this stands overall. 03/03/19 on evaluation today patient appears to be doing well in regard to his right lower  extremity in general although he did have a new blister open this does not appear to be showing any evidence of active infection at this time. Fortunately there's No fevers, chills, nausea, or vomiting noted at this time. Overall I feel like he is making good progress it does feel like that the right leg will we perform the [employer] seems to do with a bit better than three layer wrap on the left which slid down on him. We may switch to doing bilateral in the book wraps. 5/4; I have not seen Mr. Emery in quite some time. According to our case manager he did not have an open wound on his left leg last week. He had 1 remaining wound on the right posterior medial calf. He arrives today with multiple openings on the left leg probably were blisters and/or wrap injuries from Unna boots. I do not think the Unna boot's will provide adequate compression on the left. I am also not clear about the frequency he is using the compression pumps. 03/17/19 on evaluation today patient appears to be doing excellent in regard to his lower extremities compared to last week's evaluation apparently. He had gotten significantly worse last week which is unfortunate. The [employer] wrap on the left did not seem to do very well for him at all and in fact it didn't control his swelling significantly enough he had an additional outbreak. Subsequently we go back to the four layer compression wrap on the left. This is good news. At least in that he is doing better and the wound seem to be killing him. He still has not heard anything from the lymphedema clinic. 03/24/19 on evaluation today patient actually appears to be doing much better in regard to his bilateral lower Trinity as compared to last week when I saw him. Fortunately there's no signs of active infection at this time. He has been tolerating the dressing changes without complication. Overall I'm extremely pleased with the progress and appearance in general. 04/07/19 on  evaluation today patient appears to be doing well in regard to his bilateral lower extremities. His swelling is significantly down from where it was previous. With that being said he does have a couple blisters still open at this point but fortunately nothing that seems to be too severe and again the majority of the larger openings has healed at this time. 04/14/19 on evaluation today patient actually appears to be doing quite well in regard to his bilateral lower extremities in fact I'm not even sure there's anything significantly open at this time at any site. Nonetheless he did have some trouble with these wraps where they are somewhat irritating him secondary to the fact that he has noted that the graph wasn't too close down to the end of this foot in a little bit short as well up to his knee. Otherwise things seem to be doing quite well. 04/21/19 upon evaluation today patient's wound bed actually showed evidence of being completely healed in regard to both lower extremities which is excellent news. There does not appear to be any signs of active infection which is also good news. I'm very pleased in this regard. No fevers, chills, nausea, or vomiting noted at this time.  04/28/19 on evaluation today patient appears to be doing a little bit worse in regard to both lower extremities on the left mainly due to the fact that when he went infection disease the wrap was not wrapped quite high enough he developed a blister above this. On the right he is a small open area of nothing too significant but again this is continuing to give him some trouble he has been were in the Velcro compression that he has at home. 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 He's been tolerating the dressing changes without complication. Fortunately there's no signs of active infection at this time. No fevers, chills, nausea, or vomiting noted at this time. We have  been trying to get an appointment with her lymphedema clinic in [facility] but unfortunately nobody can get them on phone with not been able to even fax information over the patient likewise is not been able to get in touch with them. Overall I'm not sure exactly what's going on here with to reach out again today. 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 Still having a lot of drainage unfortunately. He tells me especially in the left but even on the right. There's no signs of active infection which is good news we've been using so ratcheted up to this point. 05/19/19 on evaluation today patient actually appears to be doing quite well with regard to his left lower extremity which is great news. Fortunately in regard to the right lower extremity has an issues with his wrap and he subsequently did remove this from what I'm understanding. Nonetheless long story short is what he had rewrapped once he removed it subsequently had maggots underneath this wrap whenever he came in for evaluation today. With that being said they were obviously completely cleaned away by the nursing staff. The visit today which is excellent news. However he does appear to potentially have some infection around the right ankle region where the maggots were located as well. He will likely require anabiotic therapy today. 05/26/19 on evaluation today patient actually appears to be doing much better in regard to his bilateral lower extremities. I feel like the infection is under much better control. With that being said there were maggots noted when the wrap was removed yet again today. Again this could have potentially been left over from previous although at this time there does not appear to be any signs of significant drainage there was obviously on the wrap some drainage as well this contracted gnats or otherwise. Either way I do  not see anything that appears to be doing worse in my pinion and in fact I think his drainage has slowed down quite significantly likely mainly due to the fact to his infection being under better control. 06/02/2019 on evaluation today patient actually appears to be doing well with regard to his bilateral lower extremities there is no signs of active infection at this time which is great news. With that being said he does have several open areas more so on the right than the left but nonetheless these are all significantly better than previously noted. 06/09/2019 on evaluation today patient actually appears to be doing well. His wrap stayed up and he did not cause any problems he had more drainage on the right compared to the left but overall I do not see any major issues  at this time which is great news. 06/16/2019 on evaluation today patient appears to be doing excellent with regard to his lower extremities the only area that is open is a new blister that can have opened as of today on the medial ankle on the left. Other than this he really seems to be doing great I see no major issues at this point. 06/23/2019 on evaluation today patient appears to be doing quite well with regard to his bilateral lower extremities. In fact he actually appears to be almost completely healed there is a small area of weeping noted of the right lower extremity just above the ankle. Nonetheless fortunately there is no signs of active infection at this time which is good news. No fevers, chills, nausea, vomiting, or diarrhea. 8/24; the patient arrived for a nurse visit today but complained of very significant pain in the left leg and therefore I was asked to look at this. Noted that he did not have an open area on the left leg last week nevertheless this was wrapped. The patient states that he is not been able to put his compression pumps on the left leg because of the discomfort. He has not been systemically unwell 06/30/2019  on evaluation today patient unfortunately despite being excellent last week is doing much worse with regard to his left lower extremity today. In fact he had to come in for a nurse on Monday where his left leg had to be rewrapped due to excessive weeping Dr. Dellia Nims placed him on doxycycline at that point. Fortunately there is no signs of active infection Systemically at this time which is good news. 07/07/2019 in regard to the patient's wounds today he actually seems to be doing well with his right lower extremity there really is nothing open or draining at this point this is great news. Unfortunately the left lower extremity is given him additional trouble at this time. There does not appear to be any signs of active infection nonetheless he does have a lot of edema and swelling noted at this point as well as blistering all of which has led to a much more poor appearing leg at this time compared to where it was 2 weeks ago when it was almost completely healed. Obviously this is a little discouraging for the patient. He is try to contact the lymphedema clinic in [place] he has not been able to get through to them. 07/14/2019 on evaluation today patient actually appears to be doing slightly better with regard to his left lower extremity ulcers. Overall I do feel like at least at the top of the wrap that we have been placing this area has healed quite nicely and looks much better. The remainder of the leg is showing signs of improvement. Unfortunately in the thigh area he still has an open region on the left and again on the right he has been utilizing just a Band-Aid on an area that also opened on the thigh. Again this is an area that were not able to wrap although we did do an Ace wrap to provide some compression that something that obviously is a little less effective than the compression wraps we have been using on the lower portion of the leg. He does have an appointment with the lymphedema clinic  in [facility] on Friday. 07/21/2019 on evaluation today patient appears to be doing better with regard to his lower extremity ulcers. He has been tolerating the dressing changes without complication. Fortunately there is no signs of  active infection at this time. No fevers, chills, nausea, vomiting, or diarrhea. I did receive the paperwork from the physical therapist at the lymphedema clinic in [place]. Subsequently I signed off on that this morning and sent that back to him for further progression with the treatment plan. 07/28/2019 on evaluation today patient appears to be doing very well with regard to his right lower extremity where I do not see any open wounds at this point. Fortunately he is feeling great as far as that is concerned as well. In regard to the left lower extremity he has been having issues with still several areas of weeping and edema although the upper leg is doing better his lower leg still I think is going require the compression wrap at this time. No fevers, chills, nausea, vomiting, or diarrhea. 08/04/2019 on evaluation today patient unfortunately is having new wounds on the right lower extremity. Again we have been using Unna boot wrap on that side. We switched him to using his juxta lite wrap at home. With that being said he tells me he has been using it although his legs extremely swollen and to be honest really does not appear that he has been. I cannot know that for sure however. Nonetheless he has multiple new wounds on the right lower extremity at this time. Obviously we will have to see about getting this rewrapped for him today. 08/11/2019 on evaluation today patient appears to be doing fairly well with regard to his wounds. He has been tolerating the dressing changes including the compression wraps without complication. He still has a lot of edema in his upper thigh regions bilaterally he is supposed to be seeing the lymphedema clinic on the 15th of this month  once his wraps arrive for the upper part of his legs. 08/18/2019 on evaluation today patient appears to be doing well with regard to his bilateral lower extremities at this point. He has been tolerating the dressing changes without complication. Fortunately there is no signs of active infection which is also good news. He does have a couple weeping areas on the first and second toe of the right foot he also has just a small area on the left foot upper leg and a small area on the left lower leg but overall he is doing quite well in my opinion. He is supposed to be getting his wraps shortly in fact tomorrow and then subsequently is seeing the lymphedema clinic next Wednesday on the 21st. Of note he is also leaving on the 25th to go on vacation for a week to the beach. For that reason and since there is some uncertainty about what there can be doing at lymphedema clinic next Wednesday I am get a make an appointment for next Friday here for [place] to see what we need to do for him prior to him leaving for vacation. 10/23; patient arrives in considerable pain predominantly in the upper posterior calf just distal to the popliteal fossa also in the wound anteriorly above the major wound. This is probably cellulitis and he has had this recurrently in the past. He has no open wound on the right side and he has had an Haematologist in that area. Finally I note that he has an area on the left posterior calf which by enlarge is mostly epithelialized. This protrudes beyond the borders of the surrounding skin in the setting of dry scaly skin and lymphedema. The patient is leaving for [facility] on Sunday. Per his longstanding pattern,  he will not take his compression pumps with him predominantly out of fear that they will be stolen. He therefore asked that we put a Unna boot back on the right leg. He will also contact the wound care center in [facility] to see if they can change his dressing in the mid week. 11/3;  patient returned from his vacation to [facility]. He was seen on 1 occasion at their wound care center. They did a 2 layer compression system as they did not have our 4-layer wrap. I am not completely certain what they put on the wounds. They did not change the Unna boot on the right. The patient is also seeing a lymphedema specialist physical therapist in [place]. It appears that he has some compression sleeve for his thighs which indeed look quite a bit better than I am used to seeing. He pumps over these with his external compression pumps. 11/10; the patient has a new wound on the right medial thigh otherwise there is no open areas on the right. He has an area on the left leg posteriorly anteriorly and medially and an area over the left second toe. We have been using silver alginate. He thinks the injury on his thigh is secondary to friction from the compression sleeve he has. 11/17; the patient has a new wound on the right medial thigh last week. He thinks this is because he did not have a underlying stocking for his thigh juxta lite apparatus. He now has this. The area is fairly large and somewhat angry but I do not think he has underlying cellulitis. He has a intact blister on the right anterior tibial area. Small wound on the right great toe dorsally Small area on the medial left calf. 11/30; the patient does not have any open areas on his right leg and we did not take his juxta lite stocking off. However he states that on Friday his compression wrap fell down lodging around his upper mid calf area. As usual this creates a lot of problems for him. He called urgently today to be seen for a nurse visit however the nurse visit turned into a provider visit because of extreme erythema and pain in the left anterior tibia extending laterally and posteriorly. The area that is problematic is extensive 10/06/2019 upon evaluation today patient actually appears to be doing poorly in regard to his  left lower extremity. He Dr. Dellia Nims did place him on doxycycline this past Monday apparently due to the fact that he was doing much worse in regard to this left leg. Fortunately the doxycycline does seem to be helping. Unfortunately we are still having a very difficult time getting his edema under any type of control in order to anticipate discharge at some point. The only way were really able to control his lymphedema really is with compression wraps and that has only even seemingly temporary. He has been seeing a lymphedema clinic they are trying to help in this regard but still this has been somewhat frustrating in general for the patient. 10/13/19 on evaluation today patient appears to be doing excellent with regard to his right lower extremity as far as the wounds are concerned. His swelling is still quite extensive unfortunately. He is still having a lot of drainage from the thigh areas bilaterally which is unfortunate. He's been going to lymphedema clinic but again he still really does not have this edema under control as far as his lower extremities are concern. With regard to his left  lower extremity this seems to be improving and I do believe the doxycycline has been of benefit for him. He is about to complete the doxycycline. 10/20/2019 on evaluation today patient appears to be doing poorly in regard to his bilateral lower extremities. More in the right thigh he has a lot of irritation at this site unfortunately. In regard to the left lower extremity the wrap was not quite as high it appears and does seem to have caused him some trouble as well. Fortunately there is no evidence of systemic infection though he does have some blue-green drainage which has me concerned for the possibility of Pseudomonas. He tells me he is previously taking Cipro without complications and he really does not care for Levaquin however due to some of the side effects he has. He is not allergic to any medications  specifically antibiotics that were aware of. 10/27/2019 on evaluation today patient actually does appear to be for the most part doing better when compared to last week's evaluation. With that being said he still has multiple open wounds over the bilateral lower extremities. He actually forgot to start taking the Cipro and states that he still has the whole bottle. He does have several new blisters on left lower extremity today I think I would recommend he go ahead and take the Cipro based on what I am seeing at this point. 12/30-Patient comes at 1 week visit, 4 layer compression wraps on the left and Unna boot on the right, primary dressing Xtrasorb and silver alginate. Patient is taking his Cipro and has a few more days left probably 5-6, and the legs are doing better. He states he is using his compressions devices which I believe he has 11/10/2019 on evaluation today patient actually appears to be much better than last time I saw him 2 weeks ago. His wounds are significantly improved and overall I am very pleased in this regard. Fortunately there is no signs of active infection at this time. He is just a couple of days away from completing Cipro. Overall his edema is much better he has been using his lymphedema pumps which I think is also helping at this point. 11/17/2019 on evaluation today patient appears to be doing excellent in regard to his wounds in general. His legs are swollen but not nearly as much as they have been in the past. Fortunately he is tolerating the compression wraps without complication. No fevers, chills, nausea, vomiting, or diarrhea. He does have some erythema however in the distal portion of his right lower extremity specifically around the forefoot and toes there is a little bit of warmth here as well. 11/24/2019 on evaluation today patient appears to be doing well with regard to his right lower extremity I really do not see any open wounds at this point. His left lower  extremity does have several open areas and his right medial thigh also is open. Other than this however overall the patient seems to be making good progress and I am very pleased at this point. 12/01/2019 on evaluation today patient appears to be doing poorly at this point in regard to his left lower extremity has several new blisters despite the fact that we have him in compression wraps. In fact he had a 4-layer compression wrap, his upper thigh wrapped from lymphedema clinic, and a juxta light over top of the 4 layer compression wrap the lymphedema clinic applied and despite all this he still develop blisters underneath. Obviously this does have me concerned  about the fact that unfortunately despite what we are doing to try to get wounds healed he continues to have new areas arise I do not think he is ever good to be at the point where he can realistically just use wraps at home to keep things under control. Typically when we heal him it takes about 1-2 days before he is back in the clinic with severe breakdown and blistering of his lower extremities bilaterally. This is happened numerous times in the past. Unfortunately I think that we may need some help as far as overall fluid overload to kind of limit what we are seeing and get things under better control. 12/08/2019 on evaluation today patient presents for follow-up concerning his ongoing bilateral lower extremity edema. Unfortunately he is still having quite a bit of swelling the compression wraps are controlling this to some degree but he did see Dr. Debara Pickett his cardiologist I do have that available for review today as far as the appointment was concerned that was on 12/06/2019. Obviously that she has been 2 days ago. The patient states that he is only been taking the Lasix 80 mg 1 time a day he had told me previously he was taking this twice a day. Nonetheless Dr. Debara Pickett recommended this be up to 80 mg 2 times a day for the patient as he did appear to  be fluid overloaded. With that being said the patient states he did this yesterday and he was unable to go anywhere or do anything due to the fact that he was constantly having to urinate. Nonetheless I think that this is still good to be something that is important for him as far as trying to get his edema under control at all things that he is going to be able to just expect his wounds to get under control and things to be better without going through at least a period of time where he is trying to stabilize his fluid management in general and I think increasing the Lasix is likely the first step here. It was also mentioned the possibility that the patient may require metolazone. With that being said he wanted to have the patient take Lasix twice a day first and then reevaluating 2 months to see where things stand. 12/15/2019 upon evaluation today patient appears to be doing regard to his legs although his toes are showing some signs of weeping especially on the left at this point to some degree on the right. There does not appear to be any signs of active infection and overall I do feel like the compression wraps are doing well for him but he has not been able to take the Lasix at home and the increased dose that Dr. Debara Pickett recommended. He tells me that just not go to be feasible for him. Nonetheless I think in this case he should probably send a message to Dr. Debara Pickett in order to discuss options from the standpoint of possible admission to get the fluid off or otherwise going forward. 12/22/2019 upon evaluation today patient appears to be doing fairly well with regard to his lower extremities at this point. In fact he would be doing excellent if it was not for the fact that his right anterior thigh apparently had an allergic reaction to adhesive tape that he used. The wound itself that we have been monitoring actually appears to be healed. There is a lot of irritation at this point. 12/29/2019 upon  evaluation today patient appears to be doing well  in regard to his lower extremities. His left medial thigh is open and somewhat draining today but this is the only region that is open the right has done much better with the treatment utilizing the steroid cream that I prescribed for him last week. Overall I am pleased in that regard. Fortunately there is no signs of active infection at this time. No fevers, chills, nausea, vomiting, or diarrhea. 01/05/2020 upon evaluation today patient appears to be doing more poorly in regard to his right lower extremity at this point upon evaluation today. Unfortunately he continues to have issues in this regard and I think the biggest issue is controlling his edema. This obviously is not very well controlled at this point is been recommended that he use the Lasix twice a day but he has not been able to do that. Unfortunately I think this is leading to an issue where honestly he is not really able to effectively control his edema and therefore the wounds really are not doing significantly better. I do not think that he is going to be able to keep things under good control unless he is able to control his edema much better. I discussed this again in great detail with him today. 01/12/2020 good news is patient actually appears to be doing quite well today at this point. He does have an appointment with lymphedema clinic tomorrow. His legs appear healed and the toe on the left is almost completely healed. In general I am very pleased with how things stand at this point. 01/19/2020 upon evaluation today patient appears to actually be doing well in regard to his lower extremities there is nothing open at this point. Fortunately he has done extremely well more recently. Has been seeing lymphedema clinic as well. With that being said he has Velcro wraps for his lower legs as well as his upper legs. The only wound really is on his toe which is the right great toe and this is  barely anything even there. With all that being said I think it is good to be appropriate today to go ahead and switch him over to the Velcro compression wraps. 01/26/2020 upon evaluation today patient appears to be doing worse with regard to his lower extremities after last week switch him to Velcro compression wraps. Unfortunately he lasted less than 24 hours he did not have the sock portion of his Velcro wrap on the left leg and subsequently developed a blister underneath the Velcro portion. Obviously this is not good and not what we were looking for at this point. He states the lymphedema clinic did tell him to wear the wrap for 23 hours and take him off for 1 I am okay with that plan but again right now we got a get things back under control again he may have some cellulitis noted as well. 02/02/2020 upon evaluation today patient unfortunately appears to have several areas of blistering on his bilateral lower extremities today mainly on the feet. His legs do seem to be doing somewhat better which is good news. Fortunately there is no evidence of active infection at this time. No fevers, chills, nausea, vomiting, or diarrhea. 02/16/2020 upon evaluation today patient appears to be doing well at this time with regard to his legs. He has a couple weeping areas on his toes but for the most part everything is doing better and does appear to be sealed up on his legs which is excellent news. We can continue with wrapping him at this point  as he had every time we discontinue the wraps he just breaks out with new wounds. There is really no point in is going forward with this at this point. 03/08/2020 upon evaluation today patient actually appears to be doing quite well with regard to his lower extremity ulcers. He has just a very superficial and really almost nonexistent blister on the left lower extremity he has in general done very well with the compression wraps. With that being said I do not see any signs  of infection at this time which is good news. 03/29/2020 upon evaluation today patient appears to be doing well with regard to his wounds currently except for where he had several new areas that opened up due to some of the wrap slipping and causing him trouble. He states he did not realize they had slipped. Nonetheless he has a 1 area on the right and 3 new areas on the left. Fortunately there is no signs of active infection at this time which is great news. 04/05/2020 upon evaluation today patient actually appears to be doing quite well in general in regard to his legs currently. Fortunately there is no signs of active infection at this time. No fevers, chills, nausea, vomiting, or diarrhea. He tells me next week that he will actually be seen in the lymphedema clinic on Thursday at 10 AM I see him on Wednesday next week. 04/12/2020 upon evaluation today patient appears to be doing very well with regard to his lower extremities bilaterally. In fact he does not appear to have any open wounds at this point which is good news. Fortunately there is no signs of active infection at this time. No fevers, chills, nausea, vomiting, or diarrhea. 04/19/2020 upon evaluation today patient appears to be doing well with regard to his wounds currently on the bilateral lower extremities. There does not appear to be any signs of active infection at this time. Fortunately there is no evidence of systemic infection and overall very pleased at this point. Nonetheless after I held him out last week he literally had blisters the next morning already which swelled up with him being right back here in the clinic. Overall I think that he is just not can be able to be discharged with his legs the way they are he is much to volume overloaded as far as fluid is concerned and that was discussed with him today of also discussed this but should try the clinic nurse manager as well as Dr. Dellia Nims. 04/26/2020 upon evaluation today patient  appears to be doing better with regard to his wounds currently. He is making some progress and overall swelling is under good control with the compression wraps. Fortunately there is no evidence of active infection at this time. 05/10/2020 on evaluation today patient appears to be doing overall well in regard to his lower extremities bilaterally. He is Tolerating the compression wraps without complication and with what we are seeing currently I feel like that he is making excellent progress. There is no signs of active infection at this time. 05/24/2020 upon evaluation today patient appears to be doing well in regard to his legs. The swelling is actually quite a bit down compared to where it has been in the past. Fortunately there is no sign of active infection at this time which is also good news. With that being said he does have several wounds on his toes that have opened up at this point. 05/31/2020 upon evaluation today patient appears to be doing well  with regard to his legs bilaterally where he really has no significant fluid buildup at this point overall he seems to be doing quite well. Very pleased in this regard. With regard to his toes these also seem to be drying up which is excellent. We have continue to wrap him as every time we tried as a transition to the juxta light wraps things just do not seem to get any better. 06/07/2020 upon evaluation today patient appears to be doing well with regard to his right leg at this point. Unfortunately left leg has a lot of blistering he tells me the wrap started to slide down on him when he tried to put his other Velcro wrap over top of it to help keep things in order but nonetheless still had some issues. 06/14/2020 on evaluation today patient appears to be doing well with regard to his lower extremity ulcers and foot ulcers at this point. I feel like everything is actually showing signs of improvement which is great news overall there is no signs of active  infection at this time. No fevers, chills, nausea, vomiting, or diarrhea. 06/21/2020 on evaluation today patient actually appears to be doing okay in regard to his wounds in general. With that being said the biggest issue I see is on his right foot in particular the first and second toe seem to be doing a little worse due to the fact this is staying very wet. I think he is probably getting need to change out his dressings a couple times in between each week when we see him in regard to his toes in order to keep this drier based on the location and how this is proceeding. 06/28/2020 on evaluation today patient appears to be doing a little bit more poorly overall in regard to the appearance of the skin I am actually somewhat concerned about the possibility of him having a little bit of an infection here. We discussed the course of potentially giving him a doxycycline prescription which he is taken previously with good result. With that being said I do believe that this is potentially mild and at this point easily fixed. I just do not want anything to get any worse. 07/12/2020 upon evaluation today patient actually appears to be making some progress with regard to his legs which is great news there does not appear to be any evidence of active infection. Overall very pleased with where things stand. 07/26/2020 upon evaluation today patient appears to be doing well with regard to his leg ulcers and toe ulcers at this point. He has been tolerating the compression wraps without complication overall very pleased in this regard. 08/09/2020 upon evaluation today patient appears to be doing well with regard to his lower extremities bilaterally. Fortunately there is no signs of active infection overall I am pleased with where things stand. 08/23/2020 on evaluation today patient appears to be doing well with regard to his wound. He has been tolerating the dressing changes without complication. Fortunately there is no  signs of active infection at this time. Overall his legs seem to be doing quite well which is great news and I am very pleased in that regard. No fevers, chills, nausea, vomiting, or diarrhea. 09/13/2020 upon evaluation today patient appears to be doing okay in regard to his lower extremities. He does have a fairly large blister on the right leg which I did remove the blister tissue from today so we can get this to dry out other than that however he  seems to be doing quite well. There is no signs of active infection at this time. 09/27/2020 upon evaluation today patient appears to actually be doing some better in regard to his right leg. Fortunately signs of active infection at this time which is great news. No fevers, chills, nausea, vomiting, or diarrhea. 10/04/2020 upon evaluation today patient actually appears to be showing signs of improvement which is great news with regard to his leg ulcers. Fortunately there is no signs of active infection which is great news he is still taking the antibiotics currently. No fevers, chills, nausea, vomiting, or diarrhea. 10/18/2020 on evaluation today patient appears to be doing well with regard to his legs currently. He has been tolerating the dressing changes including the wraps without complication. Fortunately there is no signs of active infection at this time. No fevers, chills, nausea, vomiting, or diarrhea. 10/25/2020 upon evaluation today patient appears to be doing decently well in regard to his wounds currently. He has been tolerating the dressing changes without complication. Overall I feel like he is making good progress albeit slow. Again this is something we can have to continue to wrap for some time to come most likely. 11/08/2020 upon evaluation today patient appears to be doing well with regard to his wounds currently. He has been tolerating the dressing changes without complication is not currently on any antibiotics and he does not appear to  show any signs of infection. He does continue to have a lot of drainage on the right leg not too severe but nonetheless this is very scattered. On the left leg this is looking to be much improved overall. 11/15/2020 upon evaluation today patient appears to be doing better with regard to his legs bilaterally. Especially the right leg which was much more significant last week. There does not appear to be any signs of active infection which is great news. No fevers, chills, nausea, vomiting, or diarrhea. 11/23/2019 upon evaluation today patient appears to be doing poorly still in regard to his lower extremities bilaterally. Unfortunately his right leg in particular appears to be doing much more poorly there is no signs really of infection this is not warm to touch but he does have a lot of drainage and weeping unfortunately. With that reason I do believe that we may need to initiate some treatment here to try to help calm down some of the swelling of the right leg. I think switching to a 4-layer compression wrap would be beneficial here. The patient is in agreement with giving this a try. 11/29/2020 upon evaluation today patient appears to be doing well currently in regard to his leg ulcers. I feel like the right leg is doing better he still has a lot of drainage but we do see some improvement here. The 4-layer compression wrap I think was helpful. 12/06/2020 upon evaluation today patient appears to be doing well with regard to his legs. In fact they seem to be doing about the best I have seen up to this point. Fortunately there is no signs of active infection at this time. No fevers, chills, nausea, vomiting, or diarrhea. 12/20/2020 upon evaluation today patient appears to be doing well at this time in regard to his legs. He is not having any significant draining which is great news. Fortunately there is no signs of active infection at this time. No fevers, chills, nausea, vomiting, or diarrhea. 01/17/2021 upon  evaluation today Traden actually appears to be doing excellent in regard to his legs. He has a  few areas again that come and go as far as his toes are concerned but overall this is doing quite well. 01/31/2021 upon evaluation today patient appears to be doing well with regard to his legs. Fortunately there does not appear to be any signs of active infection which is great news. Overall he is still having significant edema despite the compression wraps basically the 4-layer compression wrap to just keep things under control there is really not much room for play. 4/13: Mr. Mullikin is a longstanding patient in our clinic and benefits greatly from weekly compression wraps. Today he has no complaints. He has been tolerating the wraps well. He states he is using the lymphedema pumps at home. 5/4; patient presents for follow-up of his chronic lymphedema/venous insufficiency ulcers. He comes weekly for compression wraps. He has no complaints today. He was unable to tolerate the Coflex 2 layer Last week so we will do the four press 4-layer compression. He has been using his lymphedema pumps daily. 5/18; patient presents for 2-week follow-up. He has no complaints or issues today. He has developed a new wound to the right foot on his fourth toe. He overall feels well and denies signs of infection. 6/1; patient presents for 2-week follow-up. He has no complaints or issues today. He denies signs of infection. 04/18/2021 upon evaluation today patient appears to be doing well with regard to his legs bilaterally. Family open wound is actually on the toe of his left foot everything else is completely closed which is great news. In general I am extremely pleased with where things stand at this point. The patient is also happy that things are doing so well. 05/02/2021 upon evaluation today patient's legs actually appear to be doing quite well today. Fortunately there does not appear to be any signs of active infection  which is great and overall I am extremely pleased with where he stands today. The patient does not appear to have any evidence of active infection at this time which is also great news. 05/09/2021 upon evaluation today patient appears to be doing a little bit more poorly in regard to his legs. Unfortunately he is having issues with some breakdown and a blood blister on the left leg this is due to I believe honestly to how it was wrapped last week. Fortunately there does not appear to be any signs of infection but nonetheless this is still a concern to be honest. No fevers, chills, nausea, vomiting, or diarrhea. 05/16/2021 upon evaluation today patient appears to be doing significantly better as compared to last week. I am very pleased with where things stand today. There does not appear to be any signs of infection which is great news and overall very pleased with where we stand. No fevers, chills, nausea, vomiting, or diarrhea. 05/30/2021 upon evaluation today patient appears to be doing well with regard to his legs. He has been tolerating the dressing changes without complication. Fortunately there does not appear to be any signs of active infection which is great news and overall I am extremely pleased with where things stand today. No fevers, chills, nausea, vomiting, or diarrhea. 06/20/2021 upon evaluation today patient actually appears to be making good progress today and very pleased with what we are seeing. I think his legs are really maintaining. As long as we continue wrapping he seems to be doing excellent in my opinion. Fortunately there is no signs of active infection at this time. No fevers, chills, nausea, vomiting, or diarrhea. 07/11/2021 upon evaluation  today patient actually appears to be making excellent progress at this time. Fortunately there does not appear to be any evidence of active infection which is great news and overall I am extremely pleased with where things stand today. No  fevers, chills, nausea, vomiting, or diarrhea. 07/25/2021 upon evaluation today patient appears to be doing well currently in regard to his lower extremities. He has been making good progress here and I do not see anything that is actually open significantly today this is great news. No fevers, chills, nausea, vomiting, or diarrhea. 08/08/2021 upon evaluation today patient appears to be doing well with regard to his wound. He has been tolerating the dressing changes without complication. With that being said unfortunately has a new area that opened up as far as his right posterior leg is concerned this was a blister he also has an area on the third toe right foot which also reopen. Fortunately there is no signs of active infection at this time which is great news. No fevers, chills, nausea, vomiting, or diarrhea. 10/17; patient came in today at his request initially for a nurse visit because it but out of concern for deterioration in both his lower legs and cellulitis I was asked to look at him. He comes in with increased swelling which he says started over the weekend he started to notice pain as well in his left medial ankle, right knee, left knee left dorsal foot. His wraps fell down contributing to some of this but he has not been using his compression pumps over the weekend for reasons that are not really clear. He comes in with multiple new wounds including the right posterior leg, right third toe, right fourth toe, left second toe left medial ankle left dorsal ankle and right anterior lower leg 09/19/2021 upon evaluation today patient does seem to be making improvements in general which is great news. I do not see any evidence of infection currently he does have some hypergranulation of the anterior portion of his ankle on the left side this is going require some debridement to pare this down and then subsequently silver nitrate probably due to the amount of bleeding that he is probably going to  experience. He is in agreement with this plan however. 09/26/2021 upon evaluation today although the patient's legs appear to be doing okay today unfortunately he did have maggots noted during the evaluation as well. This is again quite unfortunate with the respect to the fact that he feels like that he got the wrap wet which was noted today he is not sure when and I think this is what led to the issue. Nonetheless we can try to see what we can do about silly things off so that this does not happen again in the future although he is can have to be diligent about taking care of his wraps as well. 10/03/2021 upon evaluation today patient appears to be doing okay in regard to his legs currently. He unfortunately has maggots again noted on the left foot. This obviously is becoming quite an issue to be perfectly honest. I do think that he needs to see what can be done at home to try to limit this exposure. Last week we had cleaned everything away so this is a new infestation as far as that is concerned. There is really not much that I can do to combat that I am trying to do what we can here in the clinic to wrap his foot and try to prevent any  access of that again with knots there is a small this becomes very difficult. 10/10/2021 upon evaluation today patient appears to be doing poorly in regard to his legs he is very swollen and unfortunately I think a big part of the issue here is simply that he is not taking his fluid pills appropriately stressed probably is not helping much at all either. He still having a lot of issues as far as getting moved and having to be out of his current living condition. With that being said he does have of note maggots noted on the right foot this time completely separate from what we have been seeing he tells me that he got his wraps wet again. Its mainly when it rains it sounds like that he goes out to his car in the area where he has to park there is a area that collects a  fairly large puddle according to what he tells me today either way I explained to him which we have done before as well that if he gets his wraps wet he needs to let us know so we can get them changed out. He does not need to keep them on wet. 10/17/2021 upon evaluation today patient appears to be doing well in regard to his legs. In fact things are significantly better compared to where they were previous. Fortunately I see no evidence of infection currently which is great news. No fevers, chills, nausea, vomiting, or diarrhea. 10/24/2021 upon evaluation today patient appears to be doing awesome in regard to his legs in general. I think everything is showing signs of significant improvement which is great news. There is really only 1 opening on the second toe left foot everything else is closed. 11/07/2020 upon evaluation today patient appears to be doing well with regard to his legs in general he does have a blister on the left foot but otherwise things are doing overall quite well. 11/28/2021 upon evaluation today patient's legs appear to be doing decently well. The biggest issue we see is that he does have some blistering where the wrap seems to have slid down on the right leg near the top. This is just below the knee. That is good to be the biggest issue going forward at this point in my opinion. Otherwise I really feel like he is doing pretty well across the board with the other wound locations previously noted. 12/05/2021 upon evaluation today patient appears to be doing well with regard to the majority of his wounds in fact he has 1 area healed. Unfortunately he has a new area that broken down over the anterior portion of his left ankle. This is quite deep and draining quite a bit. Fortunately I do not see any signs of infection here though on the lateral portion of his ankle there is some erythema and warmth to touch no drainage nothing really to culture but I am concerned about cellulitis starting  up here. Electronic Signature(s) Signed: 12/05/2021 11:55:24 AM By: Lenda Kelp PA-C Entered By: Lenda Kelp on 12/05/2021 11:55:23 -------------------------------------------------------------------------------- Physical Exam Details Patient Name: Date of Service: [name], [name] 12/05/2021 10:30 A M Medical Record Number: [id number] Patient Account Number: [id number] Date of Birth/Sex: Treating RN: Apr 01, 1951 (71 y.o. [name] Primary Care Provider: Nicoletta Ba Other Clinician: Referring Provider: Treating Provider/Extender: Adele Dan in Treatment: 306 Constitutional Obese and well-hydrated in no acute distress. Respiratory normal breathing without difficulty. Psychiatric this patient is able to make decisions  and demonstrates good insight into disease process. Alert and Oriented x 3. pleasant and cooperative. Notes Upon inspection patient's wound bed actually showed signs of pretty good granulation and epithelization at this point. Fortunately I do not see any signs of active infection locally or systemically at this time that is obvious although I am questioning the area along the left lateral ankle. For that reason I Ernie HewMinna go ahead and see about getting an sent in a prescription for doxycycline which I think may be helpful for him. Electronic Signature(s) Signed: 12/05/2021 11:55:50 AM By: Lenda KelpStone III, Roizy Harold PA-C Entered By: Lenda KelpStone III, Shamiya Demeritt on 12/05/2021 11:55:49 -------------------------------------------------------------------------------- Physician Orders Details Patient Name: Date of Service: [name], Bhavesh J. 12/05/2021 10:30 A M Medical Record Number: [id number] Patient Account Number: [id number] Date of Birth/Sex: Treating RN: 10/07/1951 (71 y.o. Damaris SchoonerM) Boehlein, Linda Primary Care Provider: Other Clinician: Nicoletta BaMcGowen, [name] Referring Provider: Treating Provider/Extender: Adele DanStone III, Zarayah Lanting McGowen, [name] in Treatment:  [phone] Verbal / Phone Orders: No Diagnosis Coding ICD-10 Coding Code Description L97.521 Non-pressure chronic ulcer of other part of left foot limited to breakdown of skin L97.829 Non-pressure chronic ulcer of other part of left lower leg with unspecified severity L97.519 Non-pressure chronic ulcer of other part of right foot with unspecified severity L97.812 Non-pressure chronic ulcer of other part of right lower leg with fat layer exposed I87.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 Leonard Schwartz- Maikayla Beggs Wednesday Return A Bathing/ Shower/ Hygiene May shower with protection but do not get wound dressing(s) wet. Edema Control - Lymphedema / SCD / Other Bilateral Lower Extremities Lymphedema Pumps. Use Lymphedema pumps on leg(s) 2-3 times a day for 45-60 minutes. If wearing any wraps or hose, do not remove them. Continue exercising as instructed. Elevate legs to the level of the heart or above for 30 minutes daily and/or when sitting, a frequency of: - throughout the day Avoid standing for long periods of time. Exercise regularly Other Edema Control Orders/Instructions: - right leg 4 layer compression with unna boot to secure to upper portion of lower leg. apply lotion to right leg. Wound Treatment Wound #204 - T Second oe Wound Laterality: Left Peri-Wound Care: Sween Lotion (Moisturizing lotion) 3 x Per Day/30 Days Discharge Instructions: Apply moisturizing lotion as directed Prim Dressing: KerraCel Ag Gelling Fiber Dressing, 2x2 in (silver alginate) 3 x Per Day/30 Days ary Discharge Instructions: Apply silver alginate to wound bed as  instructed Secondary Dressing: Woven Gauze Sponge, Non-Sterile 4x4 in 3 x Per Day/30 Days Discharge Instructions: Apply over primary dressing as directed. Secondary Dressing: ABD Pad, 8x10 3 x Per Day/30 Days Discharge Instructions: Apply over primary dressing as directed. Compression Wrap: FourPress (4 layer compression wrap) 3 x Per Day/30 Days Discharge Instructions: Apply four layer compression . unna layer at top to secure wrap Wound #211 - Ankle Wound Laterality: Left, Anterior Peri-Wound Care: Sween Lotion (Moisturizing lotion) 3 x Per Day/30 Days Discharge Instructions: Apply moisturizing lotion as directed Prim Dressing: KerraCel Ag Gelling Fiber Dressing, 2x2 in (silver alginate) 3 x Per Day/30 Days ary Discharge Instructions: Apply silver alginate to wound bed as instructed Secondary Dressing: Woven Gauze Sponge, Non-Sterile 4x4 in 3 x Per Day/30 Days Discharge Instructions: Apply over primary dressing as directed.  Secondary Dressing: ABD Pad, 8x10 3 x Per Day/30 Days Discharge Instructions: Apply over primary dressing as directed. Compression Wrap: FourPress (4 layer compression wrap) 3 x Per Day/30 Days Discharge Instructions: Apply four layer compression . unna layer at top to secure wrap Wound #212 - Lower Leg Wound Laterality: Right, Lateral Peri-Wound Care: Sween Lotion (Moisturizing lotion) 3 x Per Day/30 Days Discharge Instructions: Apply moisturizing lotion as directed Prim Dressing: KerraCel Ag Gelling Fiber Dressing, 2x2 in (silver alginate) 3 x Per Day/30 Days ary Discharge Instructions: Apply silver alginate to wound bed as instructed Secondary Dressing: Woven Gauze Sponge, Non-Sterile 4x4 in 3 x Per Day/30 Days Discharge Instructions: Apply over primary dressing as directed. Secondary Dressing: ABD Pad, 8x10 3 x Per Day/30 Days Discharge Instructions: Apply over primary dressing as directed. Compression Wrap: FourPress (4 layer compression wrap) 3 x Per Day/30  Days Discharge Instructions: Apply four layer compression . unna layer at top to secure wrap Wound #213 - Lower Leg Wound Laterality: Left, Posterior Peri-Wound Care: Sween Lotion (Moisturizing lotion) 3 x Per Day/30 Days Discharge Instructions: Apply moisturizing lotion as directed Prim Dressing: KerraCel Ag Gelling Fiber Dressing, 2x2 in (silver alginate) 3 x Per Day/30 Days ary Discharge Instructions: Apply silver alginate to wound bed as instructed Secondary Dressing: Woven Gauze Sponge, Non-Sterile 4x4 in 3 x Per Day/30 Days Discharge Instructions: Apply over primary dressing as directed. Secondary Dressing: ABD Pad, 8x10 3 x Per Day/30 Days Discharge Instructions: Apply over primary dressing as directed. Compression Wrap: FourPress (4 layer compression wrap) 3 x Per Day/30 Days Discharge Instructions: Apply four layer compression . unna layer at top to secure wrap Patient Medications llergies: No Known Allergies A Notifications Medication Indication Start End 12/05/2021 doxycycline hyclate DOSE 1 - oral 100 mg capsule - T 1 capsule 2 times per day for 14 days ake Electronic Signature(s) Signed: 12/05/2021 11:57:20 AM By: Lenda Kelp PA-C Entered By: Lenda Kelp on 12/05/2021 11:57:14 -------------------------------------------------------------------------------- Problem List Details Patient Name: Date of Service: [name], Osamah J. 12/05/2021 10:30 A M Medical Record Number: [id number] Patient Account Number: [id number] Date of Birth/Sex: Treating RN: 10-30-1951 (71 y.o. 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 04/27/2018 No Yes skin L97.829 Non-pressure chronic ulcer of  other part of left lower leg with unspecified 03/21/2021 No Yes severity L97.519 Non-pressure chronic ulcer of other part of right foot with unspecified severity 03/21/2021 No Yes L97.812 Non-pressure chronic ulcer of other part of right lower leg with fat layer 08/08/2021 No Yes exposed I87.333 Chronic venous hypertension (idiopathic) with ulcer and inflammation of 01/22/2016 No Yes bilateral lower extremity L03.116 Cellulitis of left lower limb 08/20/2021 No Yes I89.0 Lymphedema, not elsewhere classified 01/22/2016 No Yes E11.40 Type 2 diabetes mellitus with diabetic neuropathy, unspecified 01/22/2016 No Yes E11.622 Type 2 diabetes mellitus with other skin ulcer 01/22/2016 No Yes L03.115 Cellulitis of right lower limb 12/22/2017 No Yes Inactive Problems ICD-10 Code Description Active Date Inactive Date 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 06/30/2018 06/30/2018 L97.511 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 of skin 06/30/2018 06/30/2018 Resolved Problems ICD-10 Code Description Active Date Resolved Date L97.211 Non-pressure chronic ulcer of right calf limited to breakdown of skin 06/30/2018 06/30/2018  G95.621 Non-pressure chronic ulcer of left calf limited to breakdown of skin 09/30/2016 09/30/2016 L03.116 Cellulitis of left lower limb 04/01/2017 04/01/2017 L97.211 Non-pressure chronic ulcer of right calf limited to breakdown of skin 06/30/2017 06/30/2017 Electronic Signature(s) Signed: 12/05/2021 10:59:49 AM By: Lenda Kelp PA-C Entered By: Lenda Kelp on 12/05/2021 10:59:49 -------------------------------------------------------------------------------- Progress Note Details Patient Name: Date of Service: [name], Lazaro J. 12/05/2021 10:30 A M Medical Record Number: [id number] Patient Account Number: [id number] Date of Birth/Sex: Treating RN: Aug 05, 1951 (71 y.o. [name] Primary Care Provider: Nicoletta Ba  Other Clinician: Referring Provider: Treating Provider/Extender: Adele Dan in Treatment: 306 Subjective Chief Complaint Information obtained from Patient Patient is here for evaluation of venous/lymphedema ulcers History of Present Illness (HPI) Referred by PCP for consultation. Patient has long standing history of BLE venous stasis, no prior ulcerations. At beginning of month, developed cellulitis and weeping. Received IM Rocephin followed by Keflex and resolved. Wears compression stocking, appr 6 months old. Not sure strength. No present drainage. 01/22/16 this is a patient who is a type II diabetic on insulin. 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 He tells me he religiously wears pressure stockings of uncertain strength. He was here with weeping edema about 8 months ago but did not have an open wound. Roughly a month ago he had a reopening on his bilateral legs. He is been using bandages and Neosporin. He does not complain of pain. 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 Last BUN/creatinine I have is from 11/20/15 at 13 and 1.0 respectively 01/29/16; patient arrives today having tolerated the Profore wrap. He brought in his stockings and these are 18 mmHg stockings he bought from [place]. The compression here is likely inadequate. He does not complain of pain or excessive drainage she has no systemic symptoms. The wound on the right looks improved as does the one on the left although one on the left is more substantial with still tissue at risk below the actual wound area on the bilateral posterior calf 02/05/16; patient arrives with poor edema control. He states that we did put a 4 layer compression on it last week. No weight appear 5 this. 02/12/16; the area on the posterior right Has healed. The left Has a substantial  wound that has necrotic surface eschar that requires a debridement with a curette. 02/16/16;the patient called or a Nurse visit secondary to increased swelling. He had been in earlier in the week with his right leg healed. He was transitioned to is on pressure stocking on the right leg with the only open wound on the left, a substantial area on the left posterior calf. Note he has a history of severe lower extremity edema, he has a history of chronic atrial fibrillation but not heart failure per my notes but I'll need to research this. He is not complaining of chest pain shortness of breath or orthopnea. The intake nurse noted blisters on the previously closed right leg 02/19/16; this is the patient's regular visit day. I see him on Friday with escalating edema new wounds on the right leg and clear signs of at least right ventricular heart failure. I increased his Lasix to 40 twice a day. He is returning currently in follow-up. States he is noticed a decrease in that the edema 02/26/16 patient's legs have much less edema. There is  nothing really open on the right leg. The left leg has improved condition of the large superficial wound on the posterior left leg 03/04/16; edema control is very much better. The patient's right leg wounds have healed. On the left leg he continues to have severe venous inflammation on the posterior aspect of the left leg. There is no tenderness and I don't think any of this is cellulitis. 03/11/16; patient's right leg is married healed and he is in his own stocking. The patient's left leg has deteriorated somewhat. There is a lot of erythema around the wound on the posterior left leg. There is also a significant rim of erythema posteriorly just above where the wrap would've ended there is a new wound in this location and a lot of tenderness. Can't rule out cellulitis in this area. 03/15/16; patient's right leg remains healed and he is in his own stocking. The patient's left leg is  much better than last review. His major wound on the posterior aspect of his left Is almost fully epithelialized. He has 3 small injuries from the wraps. Really. Erythema seems a lot better on antibiotics 03/18/16; right leg remains healed and he is in his own stocking. The patient's left leg is much better. The area on the posterior aspect of the left calf is fully epithelialized. His 3 small injuries which were wrap injuries on the left are improved only one seems still open his erythema has resolved 03/25/16; patient's right leg remains healed and he is in his own stocking. There is no open area today on the left leg posterior leg is completely closed up. His wrap injuries at the superior aspect of his leg are also resolved. He looks as though he has some irritation on the dorsal ankle but this is fully epithelialized without evidence of infection. 03/28/16; we discharged this patient on Monday. Transitioned him into his own stocking. There were problems almost immediately with uncontrolled swelling weeping edema multiple some of which have opened. He does not feel systemically unwell in particular no chest pain no shortness of breath and he does not feel 04/08/16; the edema is under better control with the Profore light wrap but he still has pitting edema. There is one large wound anteriorly 2 on the medial aspect of his left leg and 3 small areas on the superior posterior calf. Drainage is not excessive he is tolerating a Profore light well 04/15/16; put a Profore wrap on him last week. This is controlled is edema however he had a lot of pain on his left anterior foot most of his wounds are healed 04/22/16 once again the patient has denuded areas on the left anterior foot which he states are because his wrap slips up word. He saw his primary physician today is on Lasix 40 twice a day and states that he his weight is down 20 pounds over the last 3 months. 04/29/16: Much improved. left anterior foot much  improved. He is now on Lasix 80 mg per day. Much improved edema control 05/06/16; I was hoping to be able to discharge him today however once again he has blisters at a low level of where the compression was placed last week mostly on his left lateral but also his left medial leg and a small area on the anterior part of the left foot. 05/09/16; apparently the patient went home after his appointment on 7/4 later in the evening developing pain in his upper medial thigh together with subjective fever and chills although his  temperature was not taken. The pain was so intense he felt he would probably have to call 911. However he then remembered that he had leftover doxycycline from a previous round of antibiotics and took these. By the next morning he felt a lot better. He called and spoke to one of our nurses and I approved doxycycline over the phone thinking that this was in relation to the wounds we had previously seen although they were definitely were not. The patient feels a lot better old fever no chills he is still working. Blood sugars are reasonably controlled 05/13/16; patient is back in for review of his cellulitis on his anterior medial upper thigh. He is taking doxycycline this is a lot better. Culture I did of the nodular area on the dorsal aspect of his foot grew MRSA this also looks a lot better. 05/20/16; the patient is cellulitis on the medial upper thigh has resolved. All of his wound areas including the left anterior foot, areas on the medial aspect of the left calf and the lateral aspect of the calf at all resolved. He has a new blister on the left dorsal foot at the level of the fourth toe this was excised. No evidence of infection 05/27/16; patient continues to complain weeping edema. He has new blisterlike wounds on the left anterior lateral and posterior lateral calf at the top of his wrap levels. The area on his left anterior foot appears better. He is not complaining of fever, pain or  pruritus in his feet. 05/30/16; the patient's blisters on his left anterior leg posterior calf all look improved. He did not increase the Lasix 100 mg as I suggested because he was going to run out of his 40 mg tablets. He is still having weeping edema of his toes 06/03/16; I renewed his Lasix at 80 mg once a day as he was about to run out when I last saw him. He is on 80 mg of Lasix now. I have asked him to cut down on the excessive amount of water he was drinking and asked him to drink according to his thirst mechanisms 06/12/2016 -- was seen 2 days ago and was supposed to wear his compression stockings at home but he is developed lymphedema and superficial blisters on the left lower extremity and hence came in for a review 06/24/16; the remaining wound is on his left anterior leg. He still has edema coming from between his toes. There is lymphedema here however his edema is generally better than when I last saw this. He has a history of atrial fibrillation but does not have a known history of congestive heart failure nevertheless I think he probably has this at least on a diastolic basis. 07/01/16 I reviewed his echocardiogram from January 2017. This was essentially normal. He did not have LVH, EF of 55-60%. His right ventricular function was normal although he did have trivial tricuspid and pulmonic regurgitation. This is not audible on exam however. I increased his Lasix to do massive edema in his legs well above his knees I think in early July. He was also drinking an excessive amount of water at the time. 07/15/16; missed his appointment last week because of the Labor Day holiday on Monday. He could not get another appointment later in the week. Started to feel the wrap digging in superiorly so we remove the top half and the bottom half of his wrap. He has extensive erythema and blistering superiorly in the left leg. Very tender. Very  swollen. Edema in his foot with leaking edema fluid. He has not  been systemically unwell 07/22/16; the area on the left leg laterally required some debridement. The medial wounds look more stable. His wrap injury wounds appear to have healed. Edema and his foot is better, weeping edema is also better. He tells me he is meeting with the supplier of the external compression pumps at work 08/05/16; the patient was on vacation last week in [facility]. His wrap is been on for an extended period of time. Also over the weekend he developed an extensive area of tender erythema across his anterior medial thigh. He took to doxycycline yesterday that he had leftover from a previous prescription. The patient complains of weeping edema coming out of his toes 08/08/16; I saw this patient on 10/2. He was tender across his anterior thigh. I put him on doxycycline. He returns today in follow-up. He does not have any open wounds on his lower leg, he still has edema weeping into his toes. 08/12/16; patient was seen back urgently today to follow-up for his extensive left thigh cellulitis/erysipelas. He comes back with a lot less swelling and erythema pain is much better. I believe I gave him Augmentin and Cipro. His wrap was cut down as he stated a roll down his legs. He developed blistering above the level of the wrap that remained. He has 2 open blisters and 1 intact. 08/19/16; patient is been doing his primary doctor who is increased his Lasix from 40-80 once a day or 80 already has less edema. Cellulitis has remained improved in the left thigh. 2 open areas on the posterior left calf 08/26/16; he returns today having new open blisters on the anterior part of his left leg. He has his compression pumps but is not yet been shown how to use some vital representative from the supplier. 09/02/16 patient returns today with no open wounds on the left leg. Some maceration in his plantar toes 09/10/2016 -- Dr. Leanord Hawking had recently discharged him on 09/02/2016 and he has come right back with  redness swelling and some open ulcers on his left lower extremity. He says this was caused by trying to apply his compression stockings and he's been unable to use this and has not been able to use his lymphedema pumps. He had some doxycycline leftover and he has started on this a few days ago. 09/16/16; there are no open wounds on his leg on the left and no evidence of cellulitis. 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 He does not complain of symptoms here. I am not clear use using his external compression pumps. 09/23/16; I have not seen this patient in 2 weeks. He canceled his appointment 10 days ago as he was going on vacation. He tells me that on Monday he noticed a large area on his posterior left leg which is been draining copiously and is reopened into a large wound. He is been using ABDs and the external part of his juxtalite, according to our nurse this was not on properly. 10/07/16; Still a substantial area on the posterior left leg. Using silver alginate 10/14/16; in general better although there is still open area which looks healthy. Still using silver alginate. He reminds me that this happen before he left for [facility]. T oday while he was showering in the morning. He had been using his juxtalite's 10/21/16; the area on his posterior left leg is fully epithelialized.  However he arrives today with a large area of tender erythema in his medial and posterior left thigh just above the knee. I have marked the area. Once again he is reluctant to consider hospitalization. I treated him with oral antibiotics in the past for a similar situation with resolution I think with doxycycline however this area it seems more extensive to me. He is not complaining of fever but does have chills and says states he is thirsty. His blood sugar today was in the 140s at home 10/25/16 the area on his posterior left leg is fully epithelialized  although there is still some weeping edema. The large area of tenderness and erythema in his medial and posterior left thigh is a lot less tender although there is still a lot of swelling in this thigh. He states he feels a lot better. He is on doxycycline and Augmentin that I started last week. This will continued until Tuesday, December 26. I have ordered a duplex ultrasound of the left thigh rule out DVT whether there is an abscess something that would need to be drained I would also like to know. 11/01/16; he still has weeping edema from a not fully epithelialized area on his left posterior calf. Most of the rest of this looks a lot better. He has completed his antibiotics. His thigh is a lot better. Duplex ultrasound did not show a DVT in the thigh 11/08/16; he comes in today with more Denuded surface epithelium from the posterior aspect of his calf. There is no real evidence of cellulitis. The superior aspect of his wrap appears to have put quite an indentation in his leg just below the knee and this may have contributed. He does not complain of pain or fever. We have been using silver alginate as the primary dressing. The area of cellulitis in the right thigh has totally resolved. He has been using his compression stockings once a week 11/15/16; the patient arrives today with more loss of epithelium from the posterior aspect of his left calf. He now has a fairly substantial wound in this area. The reason behind this deterioration isn't exactly clear although his edema is not well controlled. He states he feels he is generally more swollen systemically. He is not complaining of chest pain shortness of breath fever. T me he has an appointment with his primary physician in early February. He is on 80 mg of oral ells Lasix a day. He claims compliance with the external compression pumps. He is not having any pain in his legs similar to what he has with his recurrent cellulitis 11/22/16; the patient  arrives a follow-up of his large area on his left lateral calf. This looks somewhat better today. He came in earlier in the week for a dressing change since I saw him a week ago. He is not complaining of any pain no shortness of breath no chest pain 11/28/16; the patient arrives for follow-up of his large area on the left lateral calf this does not look better. In fact it is larger weeping edema. The surface of the wound does not look too bad. We have been using silver alginate although I'm not certain that this is a dressing issue. 12/05/16; again the patient follows up for a large wound on the left lateral and left posterior calf this does not look better. There continues to be weeping edema necrotic surface tissue. More worrisome than this once again there is erythema below the wound involving the distal Achilles and heel  suggestive of cellulitis. He is on his feet working most of the day of this is not going well. We are changing his dressing twice a week to facilitate the drainage. 12/12/16; not much change in the overall dimensions of the large area on the left posterior calf. This is very inflamed looking. I gave him an. Doxycycline last week does not really seem to have helped. He found the wrap very painful indeed it seems to of dog into his legs superiorly and perhaps around the heel. He came in early today because the drainage had soaked through his dressings. 12/19/16- patient arrives for follow-up evaluation of his left lower extremity ulcers. He states that he is using his lymphedema pumps once daily when there is "no drainage". He admits to not using his lipedema pumps while under current treatment. His blood sugars have been consistently between 150-200. 12/26/16; the patient is not using his compression pumps at home because of the wetness on his feet. I've advised him that I think it's important for him to use this daily. He finds his feet too wet, he can put a plastic bag over his legs while  he is in the pumps. Otherwise I think will be in a vicious circle. We are using silver alginate to the major area on his left posterior calf 01/02/17; the patient's posterior left leg has further of all into 3 open wounds. All of them covered with a necrotic surface. He claims to be using his compression pumps once a day. His edema control is marginal. Continue with silver alginate 01/10/17; the patient's left posterior leg actually looks somewhat better. There is less edema, less erythema. Still has 3 open areas covered with a necrotic surface requiring debridement. He claims to be using his compression pumps once a day his edema control is better 01/17/17; the patient's left posterior calf look better last week when I saw him and his wrap was changed 2 days ago. He has noted increasing pain in the left heel and arrives today with much larger wounds extensive erythema extending down into the entire heel area especially tender medially. He is not systemically unwell CBGs have been controlled no fever. Our intake nurse showed me limegreen drainage on his AVD pads. 01/24/17; his usual this patient responds nicely to antibiotics last week giving him Levaquin for presumed Pseudomonas. The whole entire posterior part of his leg is much better much less inflamed and in the case of his Achilles heel area much less tender. He has also had some epithelialization posteriorly there are still open areas here and still draining but overall considerably better 01/31/17- He has continue to tolerate the compression wraps. he states that he continues to use the lymphedema pumps daily, and can increase to twice daily on the weekends. He is voicing no complaints or concerns regarding his LLE ulcers 02/07/17-he is here for follow-up evaluation. He states that he noted some erythema to the left medial and anterior thigh, which he states is new as of yesterday. He is concerned about recurrent cellulitis. He states his blood sugars  have been slightly elevated, this morning in the 180s 02/14/17; he is here for follow-up evaluation. When he was last here there was erythema superiorly from his posterior wound in his anterior thigh. He was prescribed [place] however a culture of the wound surface grew MRSA over the phone I changed him to doxycycline on Monday and things seem to be a lot better. 02/24/17; patient missed his appointment on Friday therefore we  changed his nurse visit into a physician visit today. Still using silver alginate on the large area of the posterior left thigh. He isn't new area on the dorsal left second toe 03/03/17; actually better today although he admits he has not used his external compression pumps in the last 2 days or so because of work responsibilities over the weekend. 03/10/17; continued improvement. External compression pumps once a day almost all of his wounds have closed on the posterior left calf. Better edema control 03/17/17; in general improved. He still has 3 small open areas on the lateral aspect of his left leg however most of the area on the posterior part of his leg is epithelialized. He has better edema control. He has an ABD pad under his stocking on the right anterior lower leg although he did not let us look at that today. 03/24/17; patient arrives back in clinic today with no open areas however there are areas on the posterior left calf and anterior left calf that are less than 100% epithelialized. His edema is well controlled in the left lower leg. There is some pitting edema probably lymphedema in the left upper thigh. He uses compression pumps at home once per day. I tried to get him to do this twice a day although he is very reticent. 04/01/2017 -- for the last 2 days he's had significant redness, tenderness and weeping and came in for an urgent visit today. 04/07/17; patient still has 6 more days of doxycycline. He was seen by Dr. Meyer Russel last Wednesday for cellulitis involving the  posterior aspect, lateral aspect of his Involving his heel. For the most part he is better there is less erythema and less weeping. He has been on his feet for 12 hours o2 over the weekend. Using his compression pumps once a day 04/14/17 arrives today with continued improvement. Only one area on the posterior left calf that is not fully epithelialized. He has intense bilateral venous inflammation associated with his chronic venous insufficiency disease and secondary lymphedema. We have been using silver alginate to the left posterior calf wound In passing he tells [place] today that the right leg but we have not seen in quite some time has an open area on it but he doesn't want [place] to look at this today states he will show this to [place] next week. 04/21/17; there is no open area on his left leg although he still reports some weeping edema. He showed [place] his right leg today which is the first time we've seen this leg in a long time. He has a large area of open wound on the right leg anteriorly healthy granulation. Quite a bit of swelling in the right leg and some degree of venous inflammation. He told us about the right leg in passing last week but states that deterioration in the right leg really only happened over the weekend 04/28/17; there is no open area on the left leg although there is an irritated part on the posterior which is like a wrap injury. The wound on the right leg which was new from last week at least to [place] is a lot better. 05/05/17; still no open area on the left leg. Patient is using his new compression stocking which seems to be doing a good job of controlling the edema. He states he is using his compression pumps once per day. The right leg still has an open wound although it is better in terms of surface area. Required debridement. A lot of  pain in the posterior right Achilles marked tenderness. Usually this type of presentation this patient gives concern for an active cellulitis 05/12/17;  patient arrives today with his major wound from last week on the right lateral leg somewhat better. Still requiring debridement. He was using his compression stocking on the left leg however that is reopened with superficial wounds anteriorly he did not have an open wound on this leg previously. He is still using his juxta light's once daily at night. He cannot find the time to do this in the morning as he has to be at work by 7 AM 05/19/17; right lateral leg wound looks improved. No debridement required. The concerning area is on the left posterior leg which appears to almost have a subcutaneous hemorrhagic component to it. We've been using silver alginate to all the wounds 05/26/17; the right lateral leg wound continues to look improved. However the area on the left posterior calf is a tightly adherent surface. [place] using silver alginate. Because of the weeping edema in his legs there is very little good alternatives. 06/02/17; the patient left here last week looking quite good. Major wound on the left posterior calf and a small one on the right lateral calf. Both of these look satisfactory. He tells me that by Wednesday he had noted increased pain in the left leg and drainage. He called on Thursday and Friday to get an appointment here but we were blocked. He did not go to urgent care or his primary physician. He thinks he had a fever on Thursday but did not actually take his temperature. He has not been using his compression pumps on the left leg because of pain. I advised him to go to the emergency room today for IV antibiotics for stents of left leg cellulitis but he has refused I have asked him to take 2 days off work to keep his leg elevated and he has refused this as well. In view of this I'm going to call him and Augmentin and doxycycline. 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 However after a detailed discussion regarding the risk benefits and alternatives he refuses to get admitted to the hospital. With no other recourse I will continue him on oral antibiotics as before and hopefully he'll have his infectious disease consultation this week. 06/16/2017 -- the patient was seen today by the nurse practitioner at infectious disease Ms. Dixon. 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 He has also been advise Lotrimin cream locally. she also had wise IV antibiotics for his condition if it flares up 06/23/17; patient arrives today with drainage bilaterally although the remaining wound on the left posterior calf after cleaning up today "highlighter yellow drainage" did not look too bad. Unfortunately he has had breakdown on the right anterior leg [previously this leg had not been open and he is using a black stocking] he went to see infectious disease and is been put on clindamycin 150 mg daily, I did not verify the dose although I'm not familiar with using clindamycin in this dosing range,  perhaps for prophylaxisoo 06/27/17; I brought this patient back today to follow-up on the wound deterioration on the right lower leg together with surrounding cellulitis. I started him on doxycycline 4 days ago. This area looks better however he comes in today with intense cellulitis on the medial part of his left thigh. This is not have a wound in this area. Extremely tender. We've been using silver alginate to the wounds on the right lower leg left lower leg with bilateral 4 layer compression he is using his external compression pumps once a day 07/04/17; patient's left medial thigh  cellulitis looks better. He has not been using his compression pumps as his insert said it was contraindicated with cellulitis. His right leg continues to make improvements all the wounds are still open. We only have one remaining wound on the left posterior calf. Using silver alginate to all open areas. He is on doxycycline which I started a week ago and should be finishing I gave him Augmentin after Thursday's visit for the severe cellulitis on the left medial thigh which fortunately looks better 07/14/17; the patient's left medial thigh cellulitis has resolved. The cellulitis in his right lower calf on the right also looks better. All of his wounds are stable to improved we've been using silver alginate he has completed the antibiotics I have given him. He has clindamycin 150 mg once a day prescribed by infectious disease for prophylaxis, I've advised him to start this now. We have been using bilateral Unna boots over silver alginate to the wound areas 07/21/17; the patient is been to see infectious disease who noted his recurrent problems with cellulitis. He was not able to tolerate prophylactic clindamycin therefore he is on amoxicillin 500 twice a day. He also had a second daily dose of Lasix added By Dr. Oneta Rack but he is not taking this. Nor is he being completely compliant with his compression pumps a especially not this week. He has 2 remaining wounds one on the right posterior lateral lower leg and one on the left posterior medial lower leg. 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 The patient has Unna boots bilaterally. 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 He has bilateral Unna boots for compression and silver alginate to his wounds. Arrives today with his legs  looking as good as I have seen him in quite some time. Not surprisingly his wounds look better as well with improvement on the right lateral leg venous insufficiency wound and also the left medial leg. He is still using the compression pumps once a day 08/11/17; both legs appear to be doing better wounds on the right lateral and left medial legs look better. Skin on the right leg quite good. He is been using silver alginate as the primary dressing. I'm going to use Anasept gel calcium alginate and maintain all the secondary dressings 08/18/17; the patient continues to actually do quite well. The area on his right lateral leg is just about closed the left medial also looks better although it is still moist in this area. His edema is well controlled we have been using Anasept gel with calcium alginate and the usual secondary dressings, 4 layer compression and once daily use of his compression pumps "always been able to manage 09/01/17; the patient continues to  do reasonably well in spite of his trip to [place]. The area on the right lateral leg is epithelialized. Left is much better but still open. He has more edema and more chronic erythema on the left leg [venous inflammation] 09/08/17; he arrives today with no open wound on the right lateral leg and decently controlled edema. Unfortunately his left leg is not nearly as in his good situation as last week.he apparently had increasing edema starting on Saturday. He edema soaked through into his foot so used a plastic bag to walk around his home. The area on the medial right leg which was his open area is about the same however he has lost surface epithelium on the left lateral which is new and he has significant pain in the Achilles area of the left foot. He is already on amoxicillin chronically for prophylaxis of cellulitis in the left leg 09/15/17; he is completed a week of doxycycline and the cellulitis in the left posterior leg and Achilles area is  as usual improved. He still has a lot of edema and fluid soaking through his dressings. There is no open wound on the right leg. He saw infectious disease NP today 09/22/17;As usual 1 we transition him from our compression wraps to his stockings things did not go well. He has several small open areas on the right leg. He states this was caused by the compression wrap on his skin although he did not wear this with the stockings over them. He has several superficial areas on the left leg medially laterally posteriorly. He does not have any evidence of active cellulitis especially involving the left Achilles The patient is traveling from [facility] Saturday going to [facility]. He states he isn't attempting to get an appointment with a heel objects wound center there to change his dressings. I am not completely certain whether this will work 10/06/17; the patient came in on Friday for a nurse visit and the nurse reported that his legs actually look quite good. He arrives in clinic today for his regular follow-up visit. He has a new wound on his left third toe over the PIP probably caused by friction with his footwear. He has small areas on the left leg and a very superficial but epithelialized area on the right anterior lateral lower leg. Other than that his legs look as good as I've seen him in quite some time. We have been using silver alginate Review of systems; no chest pain no shortness of breath other than this a 10 point review of systems negative 10/20/17; seen by Dr. Meyer Russel last week. He had taken some antibiotics [doxycycline] that he had left over. Dr. Meyer Russel thought he had candida infection and declined to give him further antibiotics. He has a small wound remaining on the right lateral leg several areas on the left leg including a larger area on the left posterior several left medial and anterior and a small wound on the left lateral. The area on the left dorsal third toe looks a lot  better. ROS; Gen.; no fever, respiratory no cough no sputum Cardiac no chest pain other than this 10 point review of system is negative 10/30/17; patient arrives today having fallen in the bathtub 3 days ago. It took him a while to get up. He has pain and maceration in the wounds on his left leg which have deteriorated. He has not been using his pumps he also has some maceration on the right lateral leg. 11/03/17; patient continues to  have weeping edema especially in the left leg. This saturates his dressings which were just put on on 12/27. As usual the doxycycline seems to take care of the cellulitis on his lower leg. He is not complaining of fever, chills, or other systemic symptoms. He states his leg feels a lot better on the doxycycline I gave him empirically. He also apparently gets injections at his primary doctor's officeo Rocephin for cellulitis prophylaxis. I didn't ask him about his compression pump compliance today I think that's probably marginal. Arrives in the clinic with all of his dressings primary and secondary macerated full of fluid and he has bilateral edema 11/10/17; the patient's right leg looks some better although there is still a cluster of wounds on the right lateral. The left leg is inflamed with almost circumferential skin loss medially to laterally although we are still maintaining anteriorly. He does not have overt cellulitis there is a lot of drainage. He is not using compression pumps. We have been using silver alginate to the wound areas, there are not a lot of options here 11/17/17; the patient's right leg continues to be stable although there is still open wounds, better than last week. The inflammation in the left leg is better. Still loss of surface layer epithelium especially posteriorly. There is no overt cellulitis in the amount of edema and his left leg is really quite good, tells me he is using his compression pumps once a day. 11/24/17; patient's right leg has  a small superficial wound laterally this continues to improve. The inflammation in the left leg is still improving however we have continuous surface layer epithelial loss posteriorly. There is no overt cellulitis in the amount of edema in both legs is really quite good. He states he is using his compression pumps on the left leg once a day for 5 out of 7 days 12/01/17; very small superficial areas on the right lateral leg continue to improve. Edema control in both legs is better today. He has continued loss of surface epithelialization and left posterior calf although I think this is better. We have been using silver alginate with large number of absorptive secondary dressings 4 layer on the left Unna boot on the right at his request. He tells me he is using his compression pumps once a day 12/08/17; he has no open area on the right leg is edema control is good here. ooOn the left leg however he has marked erythema and tenderness breakdown of skin. He has what appears to be a wrap injury just distal to the popliteal fossa. 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 There was swelling erythema and breakdown in the right leg. He has much worse skin breakdown in the left leg as well multiple open areas medially and posteriorly as well as laterally. He tells me he has been using his compression pumps but tells me he feels that the drainage out of his leg is worse when he uses a compression pumps. T be fair to him he is been saying this o for a while however I don't know that I have really been listening to this. I wonder if the compression pumps are working properly 12/22/17;. Once again he arrives with severe erythema,  weeping  edema from the left greater than right leg. Noncompliance with compression pumps. New this visit he is complaining of pain on the lateral aspect of the right leg and the medial aspect of his right thigh. He apparently saw his cardiologist Dr. [name] who was ordered an echocardiogram area and I think this is a step in the right direction 12/25/17; started his doxycycline Monday night. There is still intense erythema of the right leg especially in the anterior thigh although there is less tenderness. The erythema around the wound on the right lateral calf also is less tender. He still complaining of pain in the left heel. His wounds are about the same right lateral left medial left lateral. Superficial but certainly not close to closure. He denies being systemically unwell no fever chills no abdominal pain no diarrhea 12/29/17; back in follow-up of his extensive right calf and right thigh cellulitis. I added amoxicillin to cover possible doxycycline resistant strep. This seems to of done the trick he is in much less pain there is much less erythema and swelling. He has his echocardiogram at 11:00 this morning. X-ray of the left heel was also negative. 01/05/18; the patient arrived with his edema under much better control. Now that he is retired he is able to use his compression pumps daily and sometimes twice a day per the patient. He has a wound on the right leg the lateral wound looks better. Area on the left leg also looks a lot better. He has no evidence of cellulitis in his bilateral thighs I had a quick peak at his echocardiogram. He is in normal ejection fraction and normal left ventricular function. He has moderate pulmonary hypertension moderately reduced right ventricular function. One would have to wonder about chronic sleep apnea although he says he doesn't snore. He'll review the echocardiogram with his cardiologist. 01/12/18; the patient arrives with the edema in both legs under exemplary  control. He is using his compression pumps daily and sometimes twice daily. His wound on the right lateral leg is just about closed. He still has some weeping areas on the posterior left calf and lateral left calf although everything is just about closed here as well. I have spoken with Aldean Baker who is the patient's nurse practitioner and infectious disease. She was concerned that the patient had not understood that the parenteral penicillin injections he was receiving for cellulitis prophylaxis was actually benefiting him. I don't think the patient actually saw that I would tend to agree we were certainly dealing with less infections although he had a serious one last month. 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 He is healed. He'll be placed in juxtalite compression wraps and increase his lymphedema pumps to twice daily. We will follow up again next week to ensure there are no issues with the new regiment. 01/20/18-he is here for evaluation of bilateral lower extremity weeping edema. Yesterday he was placed in compression wrap to the right lower extremity and compression stocking to left lower shrubbery. He states he uses lymphedema pumps last night and again this morning and noted a blister to the left lower extremity. On exam he was noted to have drainage to the right lower extremity. He will be placed in Unna boots bilaterally and follow-up next week 01/26/18; patient was actually discharged a week ago to his own juxta light stockings only to return the next day with bilateral lower extremity weeping edema.he was placed in bilateral Unna boots. He  arrives today with pain in the back of his left leg. There is no open area on the right leg however there is a linear/wrap injury on the left leg and weeping edema on the left leg posteriorly. I spoke with infectious disease about 10 days ago. They were disappointed that the patient elected to discontinue  prophylactic intramuscular penicillin shots as they felt it was particularly beneficial in reducing the frequency of his cellulitis. I discussed this with the patient today. He does not share this view. He'll definitely need antibiotics today. Finally he is traveling to [place] and trauma leaving this Saturday and returning a week later and he does not travel with his pumps. He is going by car 01/30/18; patient was seen 4 days ago and brought back in today for review of cellulitis in the left leg posteriorly. I put him on amoxicillin this really hasn't helped as much as I might like. He is also worried because he is traveling to [name] by car. Finally we will be rewrapping him. There is no open area on the right leg over his left leg has multiple weeping areas as usual 02/09/18; The same wrap on for 10 days. He did not pick up the last doxycycline I prescribed for him. He apparently took 4 days worth he already had. There is nothing open on his right leg and the edema control is really quite good. He's had damage in the left leg medially and laterally especially probably related to the prolonged use of Unna boots 02/12/18; the patient arrived in clinic today for a nurse visit/wrap change. He complained of a lot of pain in the left posterior calf. He is taking doxycycline that I previously prescribed for him. Unfortunately even though he used his stockings and apparently used to compression pumps twice a day he has weeping edema coming out of the lateral part of his right leg. This is coming from the lower anterior lateral skin area. 02/16/18; the patient has finished his doxycycline and will finish the amoxicillin 2 days. The area of cellulitis in the left calf posteriorly has resolved. He is no longer having any pain. He tells me he is using his compression pumps at least once a day sometimes twice. 02/23/18; the patient finished his doxycycline and Amoxil last week. On Friday he noticed a small  erythematous circle about the size of a quarter on the left lower leg just above his ankle. This rapidly expanded and he now has erythema on the lateral and posterior part of the thigh. This is bright red. Also has an area on the dorsal foot just above his toes and a tender area just below the left popliteal fossa. He came off his prophylactic penicillin injections at his own insistence one or 2 months ago. This is obviously deteriorated since then 03/02/18; patient is on doxycycline and Amoxil. Culture I did last week of the weeping area on the back of his left calf grew group B strep. I have therefore renewed the amoxicillin 500 3 times a day for a further week. He has not been systemically unwell. Still complaining of an area of discomfort right under his left popliteal fossa. There is no open wound on the right leg. He tells me that he is using his pumps twice a day on most days 03/09/18; patient arrives in clinic today completing his amoxicillin today. The cellulitis on his left leg is better. Furthermore he tells me that he had intramuscular penicillin shots that his primary care office  today. However he also states that the wrap on his right leg fell down shortly after leaving clinic last week. He developed a large blister that was present when he came in for a nurse visit later in the week and then he developed intense discomfort around this area.He tells me he is using his compression pumps 03/16/18; the patient has completed his doxycycline. The infectious part of this/cellulitis in the left heel area left popliteal area is a lot better. He has 2 open areas on the right calf. Still areas on the left calf but this is a lot better as well. 03/24/18; the patient arrives complaining of pain in the left popliteal area again. He thinks some of this is wrap injury. He has no open area on the right leg and really no open area on the left calf either except for the popliteal area. He claims to be compliant  with the compression pumps 03/31/18; I gave him doxycycline last week because of cellulitis in the left popliteal area. This is a lot better although the surface epithelium is denuded off and response to this. He arrives today with uncontrolled edema in the right calf area as well as a fingernail injury in the right lateral calf. There is only a few open areas on the left 04/06/18; I gave him amoxicillin doxycycline over the last 2 weeks that the amoxicillin should be completing currently. He is not complaining of any pain or systemic symptoms. The only open areas see has is on the right lateral lower leg paradoxically I cannot see anything on the left lower leg. He tells me he is using his compression pumps twice a day on most days. Silver alginate to the wounds that are open under 4 layer compression 04/13/18; he completed antibiotics and has no new complaints. Using his compression pumps. Silver alginate that anything that's opened 04/20/18; he is using his compression pumps religiously. Silver alginate 4 layer compression anything that's opened. He comes in today with no open wounds on the left leg but 3 on the right including a new one posteriorly. He has 2 on the right lateral and one on the right posterior. He likes Unna boots on the right leg for reasons that aren't really clear we had the usual 4 layer compression on the left. It may be necessary to move to the 4 layer compression on the right however for now I left them in the Unna boots 04/27/18; he is using his compression pumps at least once a day. He has still the wounds on the right lateral calf. The area right posteriorly has closed. He does not have an open wound on the left under 4 layer compression however on the dorsal left foot just proximal to the toes and the left third toe 2 small open areas were identified 05/11/18; he has not uses compression pumps. The areas on the right lateral calf have coalesced into one large wound necrotic  surface. On the left side he has one small wound anteriorly however the edema is now weeping out of a large part of his left leg. He says he wasn't using his pumps because of the weeping fluid. I explained to him that this is the time he needs to pump more 05/18/18; patient states he is using his compression pumps twice a day. The area on the right lateral large wound albeit superficial. On the left side he has innumerable number of small new wounds on the left calf particularly laterally but several anteriorly and  medially. All these appear to have healthy granulated base these look like the remnants of blisters however they occurred under compression. The patient arrives in clinic today with his legs somewhat better. There is certainly less edema, less multiple open areas on the left calf and the right anterior leg looks somewhat better as well superficial and a little smaller. However he relates pain and erythema over the last 3-4 days in the thigh and I looked at this today. He has not been systemically unwell no fever no chills no change in blood sugar values 05/25/18; comes in today in a better state. The severe cellulitis on his left leg seems better with the Keflex. Not as tender. He has not been systemically unwell ooHard to find an open wound on the left lower leg using his compression pumps twice a day ooThe confluent wounds on his right lateral calf somewhat better looking. These will ultimately need debridement I didn't do this today. 06/01/18; the severe cellulitis on the left anterior thigh has resolved and he is completed his Keflex. ooThere is no open wound on the left leg however there is a superficial excoriation at the base of the third toe dorsally. Skin on the bottom of his left foot is macerated looking. ooThe left the wounds on the lateral right leg actually looks some better although he did require debridement of the top half of this wound area with an open curet 06/09/18 on  evaluation today patient appears to be doing poorly in regard to his right lower extremity in particular this appears to likely be infected he has very thick purulent discharge along with a bright green tent to the discharge. This makes me concerned about the possibility of pseudomonas. He's also having increased discomfort at this point on evaluation. Fortunately there does not appear to be any evidence of infection spreading to the other location at this time. 06/16/18 on evaluation today patient appears to actually be doing fairly well. His ulcer has actually diminished in size quite significantly at this point which is good news. Nonetheless he still does have some evidence of infection he did see infectious disease this morning before coming here for his appointment. I did review the results of their evaluation and their note today. They did actually have him discontinue the Cipro and initiate treatment with linezolid at this time. He is doing this for the next seven days and they recommended a follow-up in four months with them. He is the keep a log of the need for intermittent antibiotic therapy between now and when he falls back up with infectious disease. This will help them gaze what exactly they need to do to try and help them out. 06/23/18; the patient arrives today with no open wounds on the left leg and left third toe healed. He is been using his compression pumps twice a day. On the right lateral leg he still has a sizable wound but this is a lot better than last time I saw this. 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 Has been using silver alginate under 4 layer compression 06/30/18; the only open wound he has is on the right lateral leg and this looks healthy. No debridement is required. We have been using silver alginate. He does not have an open wound on the left leg. There is  apparently some drainage from the dorsal proximal third toe on the left  although I see no open wound here. 07/03/18 on evaluation today patient was actually here just for a nurse visit rapid change. However when he was here on Wednesday for his rat change due to having been healed on the left and then developing blisters we initiated the wrap again knowing that he would be back today for [place] to reevaluate and see were at. Unfortunately he has developed some cellulitis into the proximal portion of his right lower extremity even into the region of his thigh. 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 He was placed on one as what at that point. Nonetheless he is done with that and has been tolerating it well otherwise. Doxycycline which in the past really did not seem to be effective for him. Nonetheless I think the best option may be for [place] to definitely reinitiate the antibiotics for a longer period of time. 07/07/18; since I last saw this patient a week ago he has had a difficult time. At that point he did not have an open wound on his left leg. We transitioned him into juxta light stockings. He was apparently in the clinic the next day with blisters on the left lateral and left medial lower calf. He also had weeping edema fluid. He was put back into a compression wrap. He was also in the clinic on Friday with intense erythema in his right thigh. Per the patient he was started on Bactrim however that didn't work at all in terms of relieving his pain and swelling. He has taken 3 doxycycline that he had left over from last time and that seems to of helped. He has blistering on the right thigh as well. 07/14/18; the erythema on his right thigh has gotten better with doxycycline that he is finishing. 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 Presumably this cellulitis in the thigh was not  related to that although I think this is a bit concerning going forward. He still has an area on the right lateral calf the blister on the right medial calf just below the knee that was discussed above. On the left 2 small open areas left medial and left lateral. Edema control is adequate. He is using his compression pumps twice a day 07/20/18; continued improvement in the condition of both legs especially the edema in his bilateral thighs. He tells me he is been losing weight through a combination of diet and exercise. He is using his compression pumps twice a day. So overall she made to the remaining wounds 07/27/2018; continued improvement in condition of both legs. His edema is well controlled. The area on the right lateral leg is just about closed he had one blisters show up on the medial left upper calf. We have him in 4 layer compression. He is going on a 10-day trip to [place]. He will be driving. He wants to wear Unna boots because of the lessening amount of constriction. He will not use compression pumps while he is away 08/05/18 on evaluation today patient actually appears to be doing decently well all things considered in regard to his bilateral lower extremities. The worst ulcer is actually only posterior aspect of his left lower extremity with a four layer compression wrap cut into his leg a couple weeks back. He did have a trip and actually had [employer] for  the trip that he is worn since he was last here. Nonetheless he feels like the [employer] actually do better for him his swelling is up a little bit but he also with his trip was not taking his Lasix on a regular set schedule like he was supposed to be. He states that obviously the reason being that he cannot drive and keep going without having to urinate too frequently which makes it difficult. He did not have his pumps with him while he was away either which I think also maybe playing a role here  too. 08/13/2018; the patient only has a small open wound on the right lateral calf which is a big improvement in the last month or 2. He also has the area posteriorly just below the posterior fossa on the left which I think was a wrap injury from several weeks ago. He has no current evidence of cellulitis. He tells me he is back into his compression pumps twice a day. He also tells me that while he was at the laundromat somebody stole a section of his extremitease stockings 08/20/2018; back in the clinic with a much improved state. He only has small areas on the right lateral mid calf which is just about healed. This was is more substantial area for quite a prolonged period of time. He has a small open area on the left anterior tibia. The area on the posterior calf just below the popliteal fossa is closed today. He is using his compression pumps twice a day 08/28/2018; patient has no open wound on the right leg. He has a smattering of open areas on the calf with some weeping lymphedema. More problematically than that it looks as though his wraps of slipped down in his usual he has very angry upper area of edema just below the right medial knee and on the right lateral calf. He has no open area on his feet. The patient is traveling to [facility] next week. I will send him in an antibiotic. We will continue to wrap the right leg. We ordered extremitease stockings for him last week and I plan to transition the right leg to a stocking when he gets home which will be in 10 days time. As usual he is very reluctant to take his pumps with him when he travels 09/07/2018; patient returns from [facility]. He shows me a picture of his left leg in the mid part of his trip last week with intense fire engine erythema. The picture look bad enough I would have considered sending him to the hospital. Instead he went to the wound care center in [facility]. They did not prescribe him antibiotics but he did  take some doxycycline he had leftover from a previous visit. I had given him trimethoprim sulfamethoxazole before he left this did not work according to the patient. This is resulted in some improvement fortunately. He comes back with a large wound on the left posterior calf. Smaller area on the left anterior tibia. Denuded blisters on the dorsal left foot over his toes. Does not have much in the way of wounds on the right leg although he does have a very tender area on the right posterior area just below the popliteal fossa also suggestive of infection. He promises me he is back on his pumps twice a day 09/15/2018; the intense cellulitis in his left lower calf is a lot better. The wound area on the posterior left calf is also so better.  However he has reasonably extensive wounds on the dorsal aspect of his second and third toes and the proximal foot just at the base of the toes. There is nothing open on the right leg 09/22/2018; the patient has excellent edema control in his legs bilaterally. He is using his external compression pumps twice a day. He has no open area on the right leg and only the areas in the left foot dorsally second and third toe area on the left side. He does not have any signs of active cellulitis. 10/06/2018; the patient has good edema control bilaterally. He has no open wound on the right leg. There is a blister in the posterior aspect of his left calf that we had to deal with today. He is using his compression pumps twice a day. There is no signs of active cellulitis. We have been using silver alginate to the wound areas. He still has vulnerable areas on the base of his left first second toes dorsally He has a his extremities stockings and we are going to transition him today into the stocking on the right leg. He is cautioned that he will need to continue to use the compression pumps twice a day. If he notices uncontrolled edema in the right leg he may need to go to 3 times a  day. 10/13/2018; the patient came in for a nurse check on Friday he has a large flaccid blister on the right medial calf just below the knee. We unroofed this. He has this and a new area underneath the posterior mid calf which was undoubtedly a blister as well. He also has several small areas on the right which is the area we put his extremities stocking on. 10/19/2018; the patient went to see infectious disease this morning I am not sure if that was a routine follow-up in any case the doxycycline I had given him was discontinued and started on linezolid. He has not started this. It is easy to look at his left calf and the inflammation and think this is cellulitis however he is very tender in the tissue just below the popliteal fossa and I have no doubt that there is infection going on here. He states the problem he is having is that with the compression pumps the edema goes down and then starts walking the wrap falls down. We will see if we can adhere this. He has 1 or 2 minuscule open areas on the right still areas that are weeping on the posterior left calf, the base of his left second and third toes 10/26/18; back today in clinic with quite of skin breakdown in his left anterior leg. This may have been infection the area below the popliteal fossa seems a lot better however tremendous epithelial loss on the left anterior mid tibia area over quite inexpensive tissue. He has 2 blisters on the right side but no other open wound here. 10/29/2018; came in urgently to see [place] today and we worked him in for review. He states that the 4 layer compression on the right leg caused pain he had to cut it down to roughly his mid calf this caused swelling above the wrap and he has blisters and skin breakdown today. As a result of the pain he has not been using his pumps. Both legs are a lot more edematous and there is a lot of weeping fluid. 11/02/18; arrives in clinic with continued difficulties in the right leg>  left. Leg is swollen and painful. multiple skin blisters  and new open areas especially laterally. He has not been using his pumps on the right leg. 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 He is not systemically unwell. 11/09/2018; the patient claims he is being compliant with his pumps. He is finished the doxycycline I gave him last week. Culture I did of the wound on the right lateral leg showed a few very resistant methicillin staph aureus. This was resistant to doxycycline. Nevertheless he states the pain in the leg is a lot better which makes me wonder if the cultured organism was not really what was causing the problem nevertheless this is a very dangerous organism to be culturing out of any wound. His right leg is still a lot larger than the left. He is using an Radio broadcast assistant on this area, he blames a 4-layer compression for causing the original skin breakdown which I doubt is true however I cannot talk him out of it. We have been using silver alginate to all of these areas which were initially blisters 11/16/2018; patient is being compliant with his external compression pumps at twice a day. Miraculously he arrives in clinic today with absolutely no open wounds. He has better edema control on the left where he has been using 4 layer compression versus wound of wounds on the right and I pointed this out to him. There is no inflammation in the skin in his lower legs which is also somewhat unusual for him. There is no open wounds on the dorsal left foot. He has extremitease stockings at home and I have asked him to bring these in next week. 11/25/18 patient's lower extremity on examination today on the left appears for the most part to be wound free. He does have an open wound on the lateral aspect of the right lower extremity but this is minimal compared to what I've seen in past. He does request that we go ahead and wrap the left leg as well even though  there's nothing open just so hopefully it will not reopen in short order. 1/28; patient has superficial open wounds on the right lateral calf left anterior calf and left posterior calf. His edema control is adequate. 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 We have been using silver alginate as the primary dressing. He claims compliance with his compression pumps 2/4; patient has superficial open wounds on numerous areas of his left calf and again one on the left dorsal foot. The areas on the right lateral calf have healed. The cellulitis that I gave him doxycycline for last week is also resolved this was mostly on the left anterior calf just below the tibial tuberosity. His edema looks fairly well-controlled. He tells me he went to see his primary doctor today and had blood work ordered 2/11; once again he has several open areas on the left calf left tibial area. Most of these are small and appear to have healthy granulation. He does not have anything open on the right. The edema and control in his thighs is pretty good which is usually a good indication he has been using his pumps as requested. 2/18; he continues to have several small areas on the left calf and left tibial area. Most of these are small healthy granulation. We put him in his stocking on the right leg last week and he arrives with a superficial open area over the  right upper tibia and a fairly large area on the right lateral tibia in similar condition. His edema control actually does not look too bad, he claims to be using his compression pumps twice a day 2/25. Continued small areas on the left calf and left tibial area. New areas especially on the right are identified just below the tibial tuberosity and on the right upper tibia itself. There are also areas of weeping edema fluid even without an obvious wound. He does not have a considerable degree  of lymphedema but clearly there is more edema here than his skin can handle. He states he is using the pumps twice a day. We have an Unna boot on the right and 4 layer compression on the left. 3/3; he continues to have an area on the right lateral calf and right posterior calf just below the popliteal fossa. There is a fair amount of tenderness around the wound on the popliteal fossa but I did not see any evidence of cellulitis, could just be that the wrap came down and rubbed in this area. ooHe does not have an open area on the left leg however there is an area on the left dorsal foot at the base of the third toe ooWe have been using silver alginate to all wound areas 3/10; he did not have an open area on his left leg last time he was here a week ago. T oday he arrives with a horizontal wound just below the tibial tuberosity and an area on the left lateral calf. He has intense erythema and tenderness in this area. The area is on the right lateral calf and right posterior calf better than last week. We have been using silver alginate as usual 3/18 - Patient returns with 3 small open areas on left calf, and 1 small open area on right calf, the skin looks ok with no significant erythema, he continues the UNA boot on right and 4 layer compression on left. The right lateral calf wound is closed , the right posterior is small area. we will continue silver alginate to the areas. 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 He is been tolerating the dressing changes without complication. Fortunately she has made excellent improvement in regard to the overall status of his wounds. Unfortunately every time we cease wrapping him he ends up reopening in causing more significant issues at that point. Again I'm unsure  of the best direction to take although I think the lymphedema clinic may be appropriate for him. 02/03/19 on evaluation today patient appears to be doing well in regard to the wounds that we saw him for last week unfortunately he has a new area on the proximal portion of his right medial/posterior lower extremity where the wrap somewhat slowed down and caused swelling and a blister to rub and open. Unfortunately this is the only opening that he has on either leg at this point. 02/17/19 on evaluation today patient's bilateral lower extremities appear to be doing well. He still completely healed in regard to the left lower extremity. In regard to the right lower extremity the area where the wrap and slid down and caused the blister still seems to be slightly open although this is dramatically better than during the last evaluation two weeks ago. I'm very pleased with the way  this stands overall. 03/03/19 on evaluation today patient appears to be doing well in regard to his right lower extremity in general although he did have a new blister open this does not appear to be showing any evidence of active infection at this time. Fortunately there's No fevers, chills, nausea, or vomiting noted at this time. Overall I feel like he is making good progress it does feel like that the right leg will we perform the [employer] seems to do with a bit better than three layer wrap on the left which slid down on him. We may switch to doing bilateral in the book wraps. 5/4; I have not seen Mr. Helf in quite some time. According to our case manager he did not have an open wound on his left leg last week. He had 1 remaining wound on the right posterior medial calf. He arrives today with multiple openings on the left leg probably were blisters and/or wrap injuries from Unna boots. I do not think the Unna boot's will provide adequate compression on the left. I am also not clear about the frequency he is using the  compression pumps. 03/17/19 on evaluation today patient appears to be doing excellent in regard to his lower extremities compared to last week's evaluation apparently. He had gotten significantly worse last week which is unfortunate. The [employer] wrap on the left did not seem to do very well for him at all and in fact it didn't control his swelling significantly enough he had an additional outbreak. Subsequently we go back to the four layer compression wrap on the left. This is good news. At least in that he is doing better and the wound seem to be killing him. He still has not heard anything from the lymphedema clinic. 03/24/19 on evaluation today patient actually appears to be doing much better in regard to his bilateral lower Trinity as compared to last week when I saw him. Fortunately there's no signs of active infection at this time. He has been tolerating the dressing changes without complication. Overall I'm extremely pleased with the progress and appearance in general. 04/07/19 on evaluation today patient appears to be doing well in regard to his bilateral lower extremities. His swelling is significantly down from where it was previous. With that being said he does have a couple blisters still open at this point but fortunately nothing that seems to be too severe and again the majority of the larger openings has healed at this time. 04/14/19 on evaluation today patient actually appears to be doing quite well in regard to his bilateral lower extremities in fact I'm not even sure there's anything significantly open at this time at any site. Nonetheless he did have some trouble with these wraps where they are somewhat irritating him secondary to the fact that he has noted that the graph wasn't too close down to the end of this foot in a little bit short as well up to his knee. Otherwise things seem to be doing quite well. 04/21/19 upon evaluation today patient's wound bed actually showed evidence of  being completely healed in regard to both lower extremities which is excellent news. There does not appear to be any signs of active infection which is also good news. I'm very pleased in this regard. No fevers, chills, nausea, or vomiting noted at this time. 04/28/19 on evaluation today patient appears to be doing a little bit worse in regard to both lower extremities on the left mainly due to the  fact that when he went infection disease the wrap was not wrapped quite high enough he developed a blister above this. On the right he is a small open area of nothing too significant but again this is continuing to give him some trouble he has been were in the Velcro compression that he has at home. 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 He's been tolerating the dressing changes without complication. Fortunately there's no signs of active infection at this time. No fevers, chills, nausea, or vomiting noted at this time. We have been trying to get an appointment with her lymphedema clinic in [facility] but unfortunately nobody can get them on phone with not been able to even fax information over the patient likewise is not been able to get in touch with them. Overall I'm not sure exactly what's going on here with to reach out again today. 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 Still having a lot of drainage unfortunately. He tells me especially in the left but even on the right. There's no signs of active infection which is good news we've been using so ratcheted up to this point. 05/19/19 on evaluation today patient actually appears to be doing quite well with regard to his left lower extremity which is great news. Fortunately in regard to the right lower extremity has an issues with his wrap and he subsequently did remove this from what I'm  understanding. Nonetheless long story short is what he had rewrapped once he removed it subsequently had maggots underneath this wrap whenever he came in for evaluation today. With that being said they were obviously completely cleaned away by the nursing staff. The visit today which is excellent news. However he does appear to potentially have some infection around the right ankle region where the maggots were located as well. He will likely require anabiotic therapy today. 05/26/19 on evaluation today patient actually appears to be doing much better in regard to his bilateral lower extremities. I feel like the infection is under much better control. With that being said there were maggots noted when the wrap was removed yet again today. Again this could have potentially been left over from previous although at this time there does not appear to be any signs of significant drainage there was obviously on the wrap some drainage as well this contracted gnats or otherwise. Either way I do not see anything that appears to be doing worse in my pinion and in fact I think his drainage has slowed down quite significantly likely mainly due to the fact to his infection being under better control. 06/02/2019 on evaluation today patient actually appears to be doing well with regard to his bilateral lower extremities there is no signs of active infection at this time which is great news. With that being said he does have several open areas more so on the right than the left but nonetheless these are all significantly better than previously noted. 06/09/2019 on evaluation today patient actually appears to be doing well. His wrap stayed up and he did not cause any problems he had more drainage on the right compared to the left but overall I do not see any major issues at this time which is great news. 06/16/2019 on evaluation today patient appears to be doing excellent with regard to his lower extremities the only area  that is open is a new blister that can have opened as of today on the medial ankle on the left. Other than this he really seems to be doing great I see no major issues at this point. 06/23/2019 on evaluation today patient appears to be doing quite well with regard to his bilateral lower extremities. In fact he actually appears to be almost completely healed there is a small area of weeping noted of the right lower extremity just above the ankle. Nonetheless fortunately there is no signs of active infection at this time which is good news. No fevers, chills, nausea, vomiting, or diarrhea. 8/24; the patient arrived for a nurse visit today but complained of very significant pain in the left leg and therefore I was asked to look at this. Noted that he did not have an open area on the left leg last week nevertheless this was wrapped. The patient states that he is not been able to put his compression pumps on the left leg because of the discomfort. He has not been systemically unwell 06/30/2019 on evaluation today patient unfortunately despite being excellent last week is doing much worse with regard to his left lower extremity today. In fact he had to come in for a nurse on Monday where his left leg had to be rewrapped due to excessive weeping Dr. Leanord Hawking placed him on doxycycline at that point. Fortunately there is no signs of active infection Systemically at this time which is good news. 07/07/2019 in regard to the patient's wounds today he actually seems to be doing well with his right lower extremity there really is nothing open or draining at this point this is great news. Unfortunately the left lower extremity is given him additional trouble at this time. There does not appear to be any signs of active infection nonetheless he does have a lot of edema and swelling noted at this point as well as blistering all of which has led to a much more poor appearing leg at this time compared to where it was 2 weeks  ago when it was almost completely healed. Obviously this is a little discouraging for the patient. He is try to contact the lymphedema clinic in [place] he has not been able to get through to them. 07/14/2019 on evaluation today patient actually appears to be doing slightly better with regard to his left lower extremity ulcers. Overall I do feel like at least at the top of the wrap that we have been placing this area has healed quite nicely and looks much better. The remainder of the leg is showing signs of improvement. Unfortunately in the thigh area he still has an open region on the left and again on the right he has been utilizing just a Band-Aid on an area that also opened on the thigh. Again this is an area that were not able to wrap although we did do an Ace wrap to provide some compression that something that obviously is a little less effective than the compression wraps we have been using on the lower portion of the leg. He does have an appointment with the lymphedema clinic in [facility] on Friday. 07/21/2019 on evaluation today patient appears to be doing better with regard to his lower extremity ulcers. He has been tolerating the dressing changes without complication. Fortunately there is no signs of active infection at this time. No fevers, chills, nausea, vomiting, or diarrhea. I did receive the paperwork from the physical therapist at the lymphedema clinic in [place].  Subsequently I signed off on that this morning and sent that back to him for further progression with the treatment plan. 07/28/2019 on evaluation today patient appears to be doing very well with regard to his right lower extremity where I do not see any open wounds at this point. Fortunately he is feeling great as far as that is concerned as well. In regard to the left lower extremity he has been having issues with still several areas of weeping and edema although the upper leg is doing better his lower leg  still I think is going require the compression wrap at this time. No fevers, chills, nausea, vomiting, or diarrhea. 08/04/2019 on evaluation today patient unfortunately is having new wounds on the right lower extremity. Again we have been using Unna boot wrap on that side. We switched him to using his juxta lite wrap at home. With that being said he tells me he has been using it although his legs extremely swollen and to be honest really does not appear that he has been. I cannot know that for sure however. Nonetheless he has multiple new wounds on the right lower extremity at this time. Obviously we will have to see about getting this rewrapped for him today. 08/11/2019 on evaluation today patient appears to be doing fairly well with regard to his wounds. He has been tolerating the dressing changes including the compression wraps without complication. He still has a lot of edema in his upper thigh regions bilaterally he is supposed to be seeing the lymphedema clinic on the 15th of this month once his wraps arrive for the upper part of his legs. 08/18/2019 on evaluation today patient appears to be doing well with regard to his bilateral lower extremities at this point. He has been tolerating the dressing changes without complication. Fortunately there is no signs of active infection which is also good news. He does have a couple weeping areas on the first and second toe of the right foot he also has just a small area on the left foot upper leg and a small area on the left lower leg but overall he is doing quite well in my opinion. He is supposed to be getting his wraps shortly in fact tomorrow and then subsequently is seeing the lymphedema clinic next Wednesday on the 21st. Of note he is also leaving on the 25th to go on vacation for a week to the beach. For that reason and since there is some uncertainty about what there can be doing at lymphedema clinic next Wednesday I am get a make an appointment for  next Friday here for [place] to see what we need to do for him prior to him leaving for vacation. 10/23; patient arrives in considerable pain predominantly in the upper posterior calf just distal to the popliteal fossa also in the wound anteriorly above the major wound. This is probably cellulitis and he has had this recurrently in the past. He has no open wound on the right side and he has had an Radio broadcast assistant in that area. Finally I note that he has an area on the left posterior calf which by enlarge is mostly epithelialized. This protrudes beyond the borders of the surrounding skin in the setting of dry scaly skin and lymphedema. The patient is leaving for [facility] on Sunday. Per his longstanding pattern, he will not take his compression pumps with him predominantly out of fear that they will be stolen. He therefore asked that we put a Roland Rack  boot back on the right leg. He will also contact the wound care center in [facility] to see if they can change his dressing in the mid week. 11/3; patient returned from his vacation to [facility]. He was seen on 1 occasion at their wound care center. They did a 2 layer compression system as they did not have our 4-layer wrap. I am not completely certain what they put on the wounds. They did not change the Unna boot on the right. The patient is also seeing a lymphedema specialist physical therapist in [place]. It appears that he has some compression sleeve for his thighs which indeed look quite a bit better than I am used to seeing. He pumps over these with his external compression pumps. 11/10; the patient has a new wound on the right medial thigh otherwise there is no open areas on the right. He has an area on the left leg posteriorly anteriorly and medially and an area over the left second toe. We have been using silver alginate. He thinks the injury on his thigh is secondary to friction from the compression sleeve he has. 11/17; the patient has a new  wound on the right medial thigh last week. He thinks this is because he did not have a underlying stocking for his thigh juxta lite apparatus. He now has this. The area is fairly large and somewhat angry but I do not think he has underlying cellulitis. ooHe has a intact blister on the right anterior tibial area. ooSmall wound on the right great toe dorsally ooSmall area on the medial left calf. 11/30; the patient does not have any open areas on his right leg and we did not take his juxta lite stocking off. However he states that on Friday his compression wrap fell down lodging around his upper mid calf area. As usual this creates a lot of problems for him. He called urgently today to be seen for a nurse visit however the nurse visit turned into a provider visit because of extreme erythema and pain in the left anterior tibia extending laterally and posteriorly. The area that is problematic is extensive 10/06/2019 upon evaluation today patient actually appears to be doing poorly in regard to his left lower extremity. He Dr. Leanord Hawking did place him on doxycycline this past Monday apparently due to the fact that he was doing much worse in regard to this left leg. Fortunately the doxycycline does seem to be helping. Unfortunately we are still having a very difficult time getting his edema under any type of control in order to anticipate discharge at some point. The only way were really able to control his lymphedema really is with compression wraps and that has only even seemingly temporary. He has been seeing a lymphedema clinic they are trying to help in this regard but still this has been somewhat frustrating in general for the patient. 10/13/19 on evaluation today patient appears to be doing excellent with regard to his right lower extremity as far as the wounds are concerned. His swelling is still quite extensive unfortunately. He is still having a lot of drainage from the thigh areas bilaterally which  is unfortunate. He's been going to lymphedema clinic but again he still really does not have this edema under control as far as his lower extremities are concern. With regard to his left lower extremity this seems to be improving and I do believe the doxycycline has been of benefit for him. He is about to complete the doxycycline.  10/20/2019 on evaluation today patient appears to be doing poorly in regard to his bilateral lower extremities. More in the right thigh he has a lot of irritation at this site unfortunately. In regard to the left lower extremity the wrap was not quite as high it appears and does seem to have caused him some trouble as well. Fortunately there is no evidence of systemic infection though he does have some blue-green drainage which has me concerned for the possibility of Pseudomonas. He tells me he is previously taking Cipro without complications and he really does not care for Levaquin however due to some of the side effects he has. He is not allergic to any medications specifically antibiotics that were aware of. 10/27/2019 on evaluation today patient actually does appear to be for the most part doing better when compared to last week's evaluation. With that being said he still has multiple open wounds over the bilateral lower extremities. He actually forgot to start taking the Cipro and states that he still has the whole bottle. He does have several new blisters on left lower extremity today I think I would recommend he go ahead and take the Cipro based on what I am seeing at this point. 12/30-Patient comes at 1 week visit, 4 layer compression wraps on the left and Unna boot on the right, primary dressing Xtrasorb and silver alginate. Patient is taking his Cipro and has a few more days left probably 5-6, and the legs are doing better. He states he is using his compressions devices which I believe he has 11/10/2019 on evaluation today patient actually appears to be much better  than last time I saw him 2 weeks ago. His wounds are significantly improved and overall I am very pleased in this regard. Fortunately there is no signs of active infection at this time. He is just a couple of days away from completing Cipro. Overall his edema is much better he has been using his lymphedema pumps which I think is also helping at this point. 11/17/2019 on evaluation today patient appears to be doing excellent in regard to his wounds in general. His legs are swollen but not nearly as much as they have been in the past. Fortunately he is tolerating the compression wraps without complication. No fevers, chills, nausea, vomiting, or diarrhea. He does have some erythema however in the distal portion of his right lower extremity specifically around the forefoot and toes there is a little bit of warmth here as well. 11/24/2019 on evaluation today patient appears to be doing well with regard to his right lower extremity I really do not see any open wounds at this point. His left lower extremity does have several open areas and his right medial thigh also is open. Other than this however overall the patient seems to be making good progress and I am very pleased at this point. 12/01/2019 on evaluation today patient appears to be doing poorly at this point in regard to his left lower extremity has several new blisters despite the fact that we have him in compression wraps. In fact he had a 4-layer compression wrap, his upper thigh wrapped from lymphedema clinic, and a juxta light over top of the 4 layer compression wrap the lymphedema clinic applied and despite all this he still develop blisters underneath. Obviously this does have me concerned about the fact that unfortunately despite what we are doing to try to get wounds healed he continues to have new areas arise I do not think  he is ever good to be at the point where he can realistically just use wraps at home to keep things under control.  Typically when we heal him it takes about 1-2 days before he is back in the clinic with severe breakdown and blistering of his lower extremities bilaterally. This is happened numerous times in the past. Unfortunately I think that we may need some help as far as overall fluid overload to kind of limit what we are seeing and get things under better control. 12/08/2019 on evaluation today patient presents for follow-up concerning his ongoing bilateral lower extremity edema. Unfortunately he is still having quite a bit of swelling the compression wraps are controlling this to some degree but he did see Dr. [name] his cardiologist I do have that available for review today as far as the appointment was concerned that was on 12/06/2019. Obviously that she has been 2 days ago. The patient states that he is only been taking the Lasix 80 mg 1 time a day he had told me previously he was taking this twice a day. Nonetheless Dr. [name] recommended this be up to 80 mg 2 times a day for the patient as he did appear to be fluid overloaded. With that being said the patient states he did this yesterday and he was unable to go anywhere or do anything due to the fact that he was constantly having to urinate. Nonetheless I think that this is still good to be something that is important for him as far as trying to get his edema under control at all things that he is going to be able to just expect his wounds to get under control and things to be better without going through at least a period of time where he is trying to stabilize his fluid management in general and I think increasing the Lasix is likely the first step here. It was also mentioned the possibility that the patient may require metolazone. With that being said he wanted to have the patient take Lasix twice a day first and then reevaluating 2 months to see where things stand. 12/15/2019 upon evaluation today patient appears to be doing regard to his legs although his  toes are showing some signs of weeping especially on the left at this point to some degree on the right. There does not appear to be any signs of active infection and overall I do feel like the compression wraps are doing well for him but he has not been able to take the Lasix at home and the increased dose that Dr. [name] recommended. He tells me that just not go to be feasible for him. Nonetheless I think in this case he should probably send a message to Dr. [name] in order to discuss options from the standpoint of possible admission to get the fluid off or otherwise going forward. 12/22/2019 upon evaluation today patient appears to be doing fairly well with regard to his lower extremities at this point. In fact he would be doing excellent if it was not for the fact that his right anterior thigh apparently had an allergic reaction to adhesive tape that he used. The wound itself that we have been monitoring actually appears to be healed. There is a lot of irritation at this point. 12/29/2019 upon evaluation today patient appears to be doing well in regard to his lower extremities. His left medial thigh is open and somewhat draining today but this is the only region that is open the right  has done much better with the treatment utilizing the steroid cream that I prescribed for him last week. Overall I am pleased in that regard. Fortunately there is no signs of active infection at this time. No fevers, chills, nausea, vomiting, or diarrhea. 01/05/2020 upon evaluation today patient appears to be doing more poorly in regard to his right lower extremity at this point upon evaluation today. Unfortunately he continues to have issues in this regard and I think the biggest issue is controlling his edema. This obviously is not very well controlled at this point is been recommended that he use the Lasix twice a day but he has not been able to do that. Unfortunately I think this is leading to an issue where honestly he  is not really able to effectively control his edema and therefore the wounds really are not doing significantly better. I do not think that he is going to be able to keep things under good control unless he is able to control his edema much better. I discussed this again in great detail with him today. 01/12/2020 good news is patient actually appears to be doing quite well today at this point. He does have an appointment with lymphedema clinic tomorrow. His legs appear healed and the toe on the left is almost completely healed. In general I am very pleased with how things stand at this point. 01/19/2020 upon evaluation today patient appears to actually be doing well in regard to his lower extremities there is nothing open at this point. Fortunately he has done extremely well more recently. Has been seeing lymphedema clinic as well. With that being said he has Velcro wraps for his lower legs as well as his upper legs. The only wound really is on his toe which is the right great toe and this is barely anything even there. With all that being said I think it is good to be appropriate today to go ahead and switch him over to the Velcro compression wraps. 01/26/2020 upon evaluation today patient appears to be doing worse with regard to his lower extremities after last week switch him to Velcro compression wraps. Unfortunately he lasted less than 24 hours he did not have the sock portion of his Velcro wrap on the left leg and subsequently developed a blister underneath the Velcro portion. Obviously this is not good and not what we were looking for at this point. He states the lymphedema clinic did tell him to wear the wrap for 23 hours and take him off for 1 I am okay with that plan but again right now we got a get things back under control again he may have some cellulitis noted as well. 02/02/2020 upon evaluation today patient unfortunately appears to have several areas of blistering on his bilateral lower  extremities today mainly on the feet. His legs do seem to be doing somewhat better which is good news. Fortunately there is no evidence of active infection at this time. No fevers, chills, nausea, vomiting, or diarrhea. 02/16/2020 upon evaluation today patient appears to be doing well at this time with regard to his legs. He has a couple weeping areas on his toes but for the most part everything is doing better and does appear to be sealed up on his legs which is excellent news. We can continue with wrapping him at this point as he had every time we discontinue the wraps he just breaks out with new wounds. There is really no point in is going forward with  this at this point. 03/08/2020 upon evaluation today patient actually appears to be doing quite well with regard to his lower extremity ulcers. He has just a very superficial and really almost nonexistent blister on the left lower extremity he has in general done very well with the compression wraps. With that being said I do not see any signs of infection at this time which is good news. 03/29/2020 upon evaluation today patient appears to be doing well with regard to his wounds currently except for where he had several new areas that opened up due to some of the wrap slipping and causing him trouble. He states he did not realize they had slipped. Nonetheless he has a 1 area on the right and 3 new areas on the left. Fortunately there is no signs of active infection at this time which is great news. 04/05/2020 upon evaluation today patient actually appears to be doing quite well in general in regard to his legs currently. Fortunately there is no signs of active infection at this time. No fevers, chills, nausea, vomiting, or diarrhea. He tells me next week that he will actually be seen in the lymphedema clinic on Thursday at 10 AM I see him on Wednesday next week. 04/12/2020 upon evaluation today patient appears to be doing very well with regard to his lower  extremities bilaterally. In fact he does not appear to have any open wounds at this point which is good news. Fortunately there is no signs of active infection at this time. No fevers, chills, nausea, vomiting, or diarrhea. 04/19/2020 upon evaluation today patient appears to be doing well with regard to his wounds currently on the bilateral lower extremities. There does not appear to be any signs of active infection at this time. Fortunately there is no evidence of systemic infection and overall very pleased at this point. Nonetheless after I held him out last week he literally had blisters the next morning already which swelled up with him being right back here in the clinic. Overall I think that he is just not can be able to be discharged with his legs the way they are he is much to volume overloaded as far as fluid is concerned and that was discussed with him today of also discussed this but should try the clinic nurse manager as well as Dr. Leanord Hawking. 04/26/2020 upon evaluation today patient appears to be doing better with regard to his wounds currently. He is making some progress and overall swelling is under good control with the compression wraps. Fortunately there is no evidence of active infection at this time. 05/10/2020 on evaluation today patient appears to be doing overall well in regard to his lower extremities bilaterally. He is Tolerating the compression wraps without complication and with what we are seeing currently I feel like that he is making excellent progress. There is no signs of active infection at this time. 05/24/2020 upon evaluation today patient appears to be doing well in regard to his legs. The swelling is actually quite a bit down compared to where it has been in the past. Fortunately there is no sign of active infection at this time which is also good news. With that being said he does have several wounds on his toes that have opened up at this point. 05/31/2020 upon evaluation  today patient appears to be doing well with regard to his legs bilaterally where he really has no significant fluid buildup at this point overall he seems to be doing quite well. Very  pleased in this regard. With regard to his toes these also seem to be drying up which is excellent. We have continue to wrap him as every time we tried as a transition to the juxta light wraps things just do not seem to get any better. 06/07/2020 upon evaluation today patient appears to be doing well with regard to his right leg at this point. Unfortunately left leg has a lot of blistering he tells me the wrap started to slide down on him when he tried to put his other Velcro wrap over top of it to help keep things in order but nonetheless still had some issues. 06/14/2020 on evaluation today patient appears to be doing well with regard to his lower extremity ulcers and foot ulcers at this point. I feel like everything is actually showing signs of improvement which is great news overall there is no signs of active infection at this time. No fevers, chills, nausea, vomiting, or diarrhea. 06/21/2020 on evaluation today patient actually appears to be doing okay in regard to his wounds in general. With that being said the biggest issue I see is on his right foot in particular the first and second toe seem to be doing a little worse due to the fact this is staying very wet. I think he is probably getting need to change out his dressings a couple times in between each week when we see him in regard to his toes in order to keep this drier based on the location and how this is proceeding. 06/28/2020 on evaluation today patient appears to be doing a little bit more poorly overall in regard to the appearance of the skin I am actually somewhat concerned about the possibility of him having a little bit of an infection here. We discussed the course of potentially giving him a doxycycline prescription which he is taken previously with good  result. With that being said I do believe that this is potentially mild and at this point easily fixed. I just do not want anything to get any worse. 07/12/2020 upon evaluation today patient actually appears to be making some progress with regard to his legs which is great news there does not appear to be any evidence of active infection. Overall very pleased with where things stand. 07/26/2020 upon evaluation today patient appears to be doing well with regard to his leg ulcers and toe ulcers at this point. He has been tolerating the compression wraps without complication overall very pleased in this regard. 08/09/2020 upon evaluation today patient appears to be doing well with regard to his lower extremities bilaterally. Fortunately there is no signs of active infection overall I am pleased with where things stand. 08/23/2020 on evaluation today patient appears to be doing well with regard to his wound. He has been tolerating the dressing changes without complication. Fortunately there is no signs of active infection at this time. Overall his legs seem to be doing quite well which is great news and I am very pleased in that regard. No fevers, chills, nausea, vomiting, or diarrhea. 09/13/2020 upon evaluation today patient appears to be doing okay in regard to his lower extremities. He does have a fairly large blister on the right leg which I did remove the blister tissue from today so we can get this to dry out other than that however he seems to be doing quite well. There is no signs of active infection at this time. 09/27/2020 upon evaluation today patient appears to actually be doing some  better in regard to his right leg. Fortunately signs of active infection at this time which is great news. No fevers, chills, nausea, vomiting, or diarrhea. 10/04/2020 upon evaluation today patient actually appears to be showing signs of improvement which is great news with regard to his leg ulcers. Fortunately there  is no signs of active infection which is great news he is still taking the antibiotics currently. No fevers, chills, nausea, vomiting, or diarrhea. 10/18/2020 on evaluation today patient appears to be doing well with regard to his legs currently. He has been tolerating the dressing changes including the wraps without complication. Fortunately there is no signs of active infection at this time. No fevers, chills, nausea, vomiting, or diarrhea. 10/25/2020 upon evaluation today patient appears to be doing decently well in regard to his wounds currently. He has been tolerating the dressing changes without complication. Overall I feel like he is making good progress albeit slow. Again this is something we can have to continue to wrap for some time to come most likely. 11/08/2020 upon evaluation today patient appears to be doing well with regard to his wounds currently. He has been tolerating the dressing changes without complication is not currently on any antibiotics and he does not appear to show any signs of infection. He does continue to have a lot of drainage on the right leg not too severe but nonetheless this is very scattered. On the left leg this is looking to be much improved overall. 11/15/2020 upon evaluation today patient appears to be doing better with regard to his legs bilaterally. Especially the right leg which was much more significant last week. There does not appear to be any signs of active infection which is great news. No fevers, chills, nausea, vomiting, or diarrhea. 11/23/2019 upon evaluation today patient appears to be doing poorly still in regard to his lower extremities bilaterally. Unfortunately his right leg in particular appears to be doing much more poorly there is no signs really of infection this is not warm to touch but he does have a lot of drainage and weeping unfortunately. With that reason I do believe that we may need to initiate some treatment here to try to help calm  down some of the swelling of the right leg. I think switching to a 4-layer compression wrap would be beneficial here. The patient is in agreement with giving this a try. 11/29/2020 upon evaluation today patient appears to be doing well currently in regard to his leg ulcers. I feel like the right leg is doing better he still has a lot of drainage but we do see some improvement here. The 4-layer compression wrap I think was helpful. 12/06/2020 upon evaluation today patient appears to be doing well with regard to his legs. In fact they seem to be doing about the best I have seen up to this point. Fortunately there is no signs of active infection at this time. No fevers, chills, nausea, vomiting, or diarrhea. 12/20/2020 upon evaluation today patient appears to be doing well at this time in regard to his legs. He is not having any significant draining which is great news. Fortunately there is no signs of active infection at this time. No fevers, chills, nausea, vomiting, or diarrhea. 01/17/2021 upon evaluation today Homero actually appears to be doing excellent in regard to his legs. He has a few areas again that come and go as far as his toes are concerned but overall this is doing quite well. 01/31/2021 upon evaluation today patient appears  to be doing well with regard to his legs. Fortunately there does not appear to be any signs of active infection which is great news. Overall he is still having significant edema despite the compression wraps basically the 4-layer compression wrap to just keep things under control there is really not much room for play. 4/13: Mr. Gerardo is a longstanding patient in our clinic and benefits greatly from weekly compression wraps. Today he has no complaints. He has been tolerating the wraps well. He states he is using the lymphedema pumps at home. 5/4; patient presents for follow-up of his chronic lymphedema/venous insufficiency ulcers. He comes weekly for compression wraps. He  has no complaints today. He was unable to tolerate the Coflex 2 layer Last week so we will do the four press 4-layer compression. He has been using his lymphedema pumps daily. 5/18; patient presents for 2-week follow-up. He has no complaints or issues today. He has developed a new wound to the right foot on his fourth toe. He overall feels well and denies signs of infection. 6/1; patient presents for 2-week follow-up. He has no complaints or issues today. He denies signs of infection. 04/18/2021 upon evaluation today patient appears to be doing well with regard to his legs bilaterally. Family open wound is actually on the toe of his left foot everything else is completely closed which is great news. In general I am extremely pleased with where things stand at this point. The patient is also happy that things are doing so well. 05/02/2021 upon evaluation today patient's legs actually appear to be doing quite well today. Fortunately there does not appear to be any signs of active infection which is great and overall I am extremely pleased with where he stands today. The patient does not appear to have any evidence of active infection at this time which is also great news. 05/09/2021 upon evaluation today patient appears to be doing a little bit more poorly in regard to his legs. Unfortunately he is having issues with some breakdown and a blood blister on the left leg this is due to I believe honestly to how it was wrapped last week. Fortunately there does not appear to be any signs of infection but nonetheless this is still a concern to be honest. No fevers, chills, nausea, vomiting, or diarrhea. 05/16/2021 upon evaluation today patient appears to be doing significantly better as compared to last week. I am very pleased with where things stand today. There does not appear to be any signs of infection which is great news and overall very pleased with where we stand. No fevers, chills, nausea, vomiting,  or diarrhea. 05/30/2021 upon evaluation today patient appears to be doing well with regard to his legs. He has been tolerating the dressing changes without complication. Fortunately there does not appear to be any signs of active infection which is great news and overall I am extremely pleased with where things stand today. No fevers, chills, nausea, vomiting, or diarrhea. 06/20/2021 upon evaluation today patient actually appears to be making good progress today and very pleased with what we are seeing. I think his legs are really maintaining. As long as we continue wrapping he seems to be doing excellent in my opinion. Fortunately there is no signs of active infection at this time. No fevers, chills, nausea, vomiting, or diarrhea. 07/11/2021 upon evaluation today patient actually appears to be making excellent progress at this time. Fortunately there does not appear to be any evidence of active infection which is  great news and overall I am extremely pleased with where things stand today. No fevers, chills, nausea, vomiting, or diarrhea. 07/25/2021 upon evaluation today patient appears to be doing well currently in regard to his lower extremities. He has been making good progress here and I do not see anything that is actually open significantly today this is great news. No fevers, chills, nausea, vomiting, or diarrhea. 08/08/2021 upon evaluation today patient appears to be doing well with regard to his wound. He has been tolerating the dressing changes without complication. With that being said unfortunately has a new area that opened up as far as his right posterior leg is concerned this was a blister he also has an area on the third toe right foot which also reopen. Fortunately there is no signs of active infection at this time which is great news. No fevers, chills, nausea, vomiting, or diarrhea. 10/17; patient came in today at his request initially for a nurse visit because it but out of concern for  deterioration in both his lower legs and cellulitis I was asked to look at him. He comes in with increased swelling which he says started over the weekend he started to notice pain as well in his left medial ankle, right knee, left knee left dorsal foot. His wraps fell down contributing to some of this but he has not been using his compression pumps over the weekend for reasons that are not really clear. He comes in with multiple new wounds including the right posterior leg, right third toe, right fourth toe, left second toe left medial ankle left dorsal ankle and right anterior lower leg 09/19/2021 upon evaluation today patient does seem to be making improvements in general which is great news. I do not see any evidence of infection currently he does have some hypergranulation of the anterior portion of his ankle on the left side this is going require some debridement to pare this down and then subsequently silver nitrate probably due to the amount of bleeding that he is probably going to experience. He is in agreement with this plan however. 09/26/2021 upon evaluation today although the patient's legs appear to be doing okay today unfortunately he did have maggots noted during the evaluation as well. This is again quite unfortunate with the respect to the fact that he feels like that he got the wrap wet which was noted today he is not sure when and I think this is what led to the issue. Nonetheless we can try to see what we can do about silly things off so that this does not happen again in the future although he is can have to be diligent about taking care of his wraps as well. 10/03/2021 upon evaluation today patient appears to be doing okay in regard to his legs currently. He unfortunately has maggots again noted on the left foot. This obviously is becoming quite an issue to be perfectly honest. I do think that he needs to see what can be done at home to try to limit this exposure. Last week we  had cleaned everything away so this is a new infestation as far as that is concerned. There is really not much that I can do to combat that I am trying to do what we can here in the clinic to wrap his foot and try to prevent any access of that again with knots there is a small this becomes very difficult. 10/10/2021 upon evaluation today patient appears to be doing poorly in regard  to his legs he is very swollen and unfortunately I think a big part of the issue here is simply that he is not taking his fluid pills appropriately stressed probably is not helping much at all either. He still having a lot of issues as far as getting moved and having to be out of his current living condition. With that being said he does have of note maggots noted on the right foot this time completely separate from what we have been seeing he tells me that he got his wraps wet again. Its mainly when it rains it sounds like that he goes out to his car in the area where he has to park there is a area that collects a fairly large puddle according to what he tells me today either way I explained to him which we have done before as well that if he gets his wraps wet he needs to let us know so we can get them changed out. He does not need to keep them on wet. 10/17/2021 upon evaluation today patient appears to be doing well in regard to his legs. In fact things are significantly better compared to where they were previous. Fortunately I see no evidence of infection currently which is great news. No fevers, chills, nausea, vomiting, or diarrhea. 10/24/2021 upon evaluation today patient appears to be doing awesome in regard to his legs in general. I think everything is showing signs of significant improvement which is great news. There is really only 1 opening on the second toe left foot everything else is closed. 11/07/2020 upon evaluation today patient appears to be doing well with regard to his legs in general he does have a blister  on the left foot but otherwise things are doing overall quite well. 11/28/2021 upon evaluation today patient's legs appear to be doing decently well. The biggest issue we see is that he does have some blistering where the wrap seems to have slid down on the right leg near the top. This is just below the knee. That is good to be the biggest issue going forward at this point in my opinion. Otherwise I really feel like he is doing pretty well across the board with the other wound locations previously noted. 12/05/2021 upon evaluation today patient appears to be doing well with regard to the majority of his wounds in fact he has 1 area healed. Unfortunately he has a new area that broken down over the anterior portion of his left ankle. This is quite deep and draining quite a bit. Fortunately I do not see any signs of infection here though on the lateral portion of his ankle there is some erythema and warmth to touch no drainage nothing really to culture but I am concerned about cellulitis starting up here. Objective Constitutional Obese and well-hydrated in no acute distress. Vitals Time Taken: 11:08 AM, Height: 70 in, Source: Stated, Weight: 380.2 lbs, Source: Stated, BMI: 54.5, Temperature: 97.8 F, Pulse: 66 bpm, Respiratory Rate: 22 breaths/min, Blood Pressure: 123/63 mmHg, Capillary Blood Glucose: 184 mg/dl. General Notes: glucose per pt report this am Respiratory normal breathing without difficulty. Psychiatric this patient is able to make decisions and demonstrates good insight into disease process. Alert and Oriented x 3. pleasant and cooperative. General Notes: Upon inspection patient's wound bed actually showed signs of pretty good granulation and epithelization at this point. Fortunately I do not see any signs of active infection locally or systemically at this time that is obvious although I am questioning  the area along the left lateral ankle. For that reason I Ernie Hew go ahead and see  about getting an sent in a prescription for doxycycline which I think may be helpful for him. Integumentary (Hair, Skin) Wound #204 status is Open. Original cause of wound was Gradually Appeared. The date acquired was: 09/26/2021. The wound has been in treatment 10 weeks. The wound is located on the Left T Second. The wound measures 0.3cm length x 0.3cm width x 0.1cm depth; 0.071cm^2 area and 0.007cm^3 volume. There oe is Fat Layer (Subcutaneous Tissue) exposed. There is no tunneling or undermining noted. There is a medium amount of serous drainage noted. The wound margin is distinct with the outline attached to the wound base. There is small (1-33%) pink granulation within the wound bed. There is no necrotic tissue within the wound bed. Wound #210 status is Healed - Epithelialized. Original cause of wound was Blister. The date acquired was: 11/07/2021. The wound has been in treatment 4 weeks. The wound is located on the Left,Dorsal Foot. The wound measures 0cm length x 0cm width x 0cm depth; 0cm^2 area and 0cm^3 volume. There is Fat Layer (Subcutaneous Tissue) exposed. There is no tunneling or undermining noted. There is a small amount of serous drainage noted. The wound margin is distinct with the outline attached to the wound base. There is large (67-100%) red granulation within the wound bed. There is no necrotic tissue within the wound bed. Wound #211 status is Open. Original cause of wound was Gradually Appeared. The date acquired was: 12/05/2021. The wound is located on the Left,Anterior Ankle. The wound measures 2cm length x 3.8cm width x 0.2cm depth; 5.969cm^2 area and 1.194cm^3 volume. There is Fat Layer (Subcutaneous Tissue) exposed. There is no tunneling or undermining noted. There is a medium amount of serosanguineous drainage noted. The wound margin is flat and intact. There is large (67-100%) red granulation within the wound bed. There is a small (1-33%) amount of necrotic tissue within the  wound bed including Adherent Slough. General Notes: tender and periwound erythema Wound #212 status is Open. Original cause of wound was Blister. The date acquired was: 12/05/2021. The wound is located on the Right,Lateral Lower Leg. The wound measures 1.8cm length x 5cm width x 0.1cm depth; 7.069cm^2 area and 0.707cm^3 volume. There is Fat Layer (Subcutaneous Tissue) exposed. There is no tunneling or undermining noted. There is a medium amount of serous drainage noted. The wound margin is flat and intact. There is large (67-100%) red, pink granulation within the wound bed. There is a small (1-33%) amount of necrotic tissue within the wound bed including Adherent Slough. Wound #213 status is Open. Original cause of wound was Blister. The date acquired was: 12/05/2021. The wound is located on the Left,Posterior Lower Leg. The wound measures 1.2cm length x 2.5cm width x 0.1cm depth; 2.356cm^2 area and 0.236cm^3 volume. There is Fat Layer (Subcutaneous Tissue) exposed. There is no tunneling or undermining noted. There is a medium amount of serous drainage noted. The wound margin is flat and intact. There is large (67-100%) red granulation within the wound bed. There is no necrotic tissue within the wound bed. 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 There was a Four Layer Compression Therapy Procedure by Zenaida Deed, RN. Post procedure Diagnosis Wound #212: Same as Pre-Procedure Wound #213 Pre-procedure diagnosis of Wound #213 is a Lymphedema located on the Left,Posterior Lower Leg . There was a Four Layer Compression Therapy Procedure by Zenaida Deed, RN. Post procedure Diagnosis Wound #213: Same as Pre-Procedure Plan Follow-up Appointments: Return Appointment in 1 week. [facility] Wednesday Bathing/ Shower/ Hygiene: May shower with protection but do not get wound dressing(s) wet. Edema Control - Lymphedema / SCD / Other: Lymphedema Pumps. Use Lymphedema pumps on leg(s) 2-3 times a day for 45-60 minutes. If wearing any wraps or hose, do not remove them. Continue exercising as instructed. Elevate legs to the level of the heart or above for 30 minutes daily and/or when sitting, a frequency of: - throughout the day Avoid standing for long periods of time. Exercise regularly Other Edema Control Orders/Instructions: - right leg 4 layer compression with unna boot to secure to upper portion of lower leg. apply lotion to right leg. The following medication(s) was prescribed: doxycycline hyclate oral 100 mg capsule 1 T 1 capsule 2 times per day for 14 days starting 12/05/2021 ake WOUND #204: - T Second Wound Laterality: Left oe Peri-Wound Care: Sween Lotion (Moisturizing lotion) 3 x Per Day/30 Days Discharge Instructions: Apply moisturizing lotion as directed Prim Dressing: KerraCel Ag Gelling Fiber Dressing, 2x2 in (silver alginate) 3 x Per Day/30 Days ary Discharge Instructions: Apply silver alginate to wound bed as instructed Secondary Dressing: Woven Gauze Sponge, Non-Sterile 4x4 in 3 x Per Day/30 Days Discharge Instructions: Apply over primary dressing as directed. Secondary Dressing: ABD Pad, 8x10 3 x Per Day/30 Days Discharge  Instructions: Apply over primary dressing as directed. Com pression Wrap: FourPress (4 layer compression wrap) 3 x Per Day/30 Days Discharge Instructions: Apply four layer compression . unna layer at top to secure wrap WOUND #211: - Ankle Wound Laterality: Left, Anterior Peri-Wound Care: Sween Lotion (Moisturizing lotion) 3 x Per Day/30 Days Discharge Instructions: Apply moisturizing lotion as directed Prim Dressing: KerraCel Ag Gelling Fiber Dressing, 2x2 in (silver alginate) 3 x Per Day/30 Days ary Discharge Instructions: Apply silver alginate to wound bed as instructed Secondary Dressing: Woven Gauze Sponge, Non-Sterile 4x4 in 3 x Per Day/30 Days Discharge Instructions: Apply over primary dressing as directed. Secondary Dressing: ABD Pad, 8x10 3 x Per Day/30 Days Discharge Instructions: Apply over primary dressing as directed. Com pression Wrap: FourPress (4 layer compression wrap) 3 x Per Day/30 Days Discharge Instructions: Apply four layer compression . unna layer at top to secure wrap WOUND #212: - Lower Leg Wound Laterality: Right, Lateral Peri-Wound Care: Sween Lotion (Moisturizing lotion) 3 x Per Day/30 Days Discharge Instructions: Apply moisturizing lotion as directed Prim Dressing: KerraCel Ag Gelling Fiber Dressing, 2x2 in (silver alginate) 3 x Per Day/30 Days ary Discharge Instructions: Apply silver alginate to wound bed as instructed Secondary Dressing: Woven Gauze Sponge, Non-Sterile 4x4 in 3 x Per Day/30 Days Discharge Instructions: Apply over primary dressing as directed. Secondary Dressing: ABD Pad, 8x10 3 x Per Day/30 Days Discharge Instructions: Apply over primary dressing as directed. Com pression Wrap: FourPress (4 layer compression  wrap) 3 x Per Day/30 Days Discharge Instructions: Apply four layer compression . unna layer at top to secure wrap WOUND #213: - Lower Leg Wound Laterality: Left, Posterior Peri-Wound Care: Sween Lotion (Moisturizing lotion) 3 x Per Day/30  Days Discharge Instructions: Apply moisturizing lotion as directed Prim Dressing: KerraCel Ag Gelling Fiber Dressing, 2x2 in (silver alginate) 3 x Per Day/30 Days ary Discharge Instructions: Apply silver alginate to wound bed as instructed Secondary Dressing: Woven Gauze Sponge, Non-Sterile 4x4 in 3 x Per Day/30 Days Discharge Instructions: Apply over primary dressing as directed. Secondary Dressing: ABD Pad, 8x10 3 x Per Day/30 Days Discharge Instructions: Apply over primary dressing as directed. Com pression Wrap: FourPress (4 layer compression wrap) 3 x Per Day/30 Days Discharge Instructions: Apply four layer compression . unna layer at top to secure wrap 1. I would recommend that we go ahead and initiate treatment with the doxycycline for 14 days hopefully this will help him out. 2. I am also can recommend that we go ahead and continue with the silver alginate dressing any open wound locations. That seems to be doing well. 3. I would also suggest he continue to elevate his leg he should be taking his Lasix as directed by his primary care provider we will see how that goes. We will see patient back for reevaluation in 1 week here in the clinic. If anything worsens or changes patient will contact our office for additional recommendations. Electronic Signature(s) Signed: 12/05/2021 11:57:37 AM By: Lenda Kelp PA-C Entered By: Lenda Kelp on 12/05/2021 11:57:37 -------------------------------------------------------------------------------- SuperBill Details Patient Name: Date of Service: [name], Jabree J. 12/05/2021 Medical Record Number: [id number] Patient Account Number: [id number] Date of Birth/Sex: Treating RN: 07-29-1951 (71 y.o. 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 L97.812 Non-pressure chronic ulcer of other part of right lower leg with fat layer exposed I87.333 Chronic venous hypertension (idiopathic) with ulcer and inflammation of bilateral lower extremity L03.116 Cellulitis of left lower limb I89.0 Lymphedema, not elsewhere classified E11.40 Type 2 diabetes mellitus with diabetic neuropathy, unspecified E11.622 Type 2 diabetes mellitus with other skin ulcer L03.115 Cellulitis of right lower limb Facility Procedures CPT4: Code [id number] Description: 81 BILATERAL: Application of multi-layer venous compression system; leg (below knee), including ankle and t. 

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
## 2021-12-06 ENCOUNTER — Ambulatory Visit (HOSPITAL_COMMUNITY): Payer: Medicare Other | Admitting: Licensed Clinical Social Worker

## 2021-12-10 ENCOUNTER — Other Ambulatory Visit: Payer: Self-pay | Admitting: Family Medicine

## 2021-12-11 MED ORDER — HYDROCODONE-ACETAMINOPHEN 5-325 MG PO TABS: ORAL_TABLET | ORAL | 0 refills | Status: DC

## 2021-12-11 NOTE — Telephone Encounter (Signed)
Requesting: [employer] Contract: 08/03/21 UDS: N/A Last Visit: 10/10/21 Next Visit: 01/09/22 Last Refill: 10/22/21(60,0)  Please Advise. Med pending

## 2021-12-12 ENCOUNTER — Encounter (HOSPITAL_BASED_OUTPATIENT_CLINIC_OR_DEPARTMENT_OTHER): Payer: Medicare Other | Admitting: Physician Assistant

## 2021-12-12 ENCOUNTER — Other Ambulatory Visit: Payer: Self-pay

## 2021-12-12 DIAGNOSIS — L97322 Non-pressure chronic ulcer of left ankle with fat layer exposed: Secondary | ICD-10-CM | POA: Diagnosis not present

## 2021-12-12 DIAGNOSIS — L97211 Non-pressure chronic ulcer of right calf limited to breakdown of skin: Secondary | ICD-10-CM | POA: Diagnosis not present

## 2021-12-12 DIAGNOSIS — L97221 Non-pressure chronic ulcer of left calf limited to breakdown of skin: Secondary | ICD-10-CM | POA: Diagnosis not present

## 2021-12-12 DIAGNOSIS — L97929 Non-pressure chronic ulcer of unspecified part of left lower leg with unspecified severity: Secondary | ICD-10-CM | POA: Diagnosis not present

## 2021-12-12 DIAGNOSIS — E11621 Type 2 diabetes mellitus with foot ulcer: Secondary | ICD-10-CM | POA: Diagnosis not present

## 2021-12-12 DIAGNOSIS — L97912 Non-pressure chronic ulcer of unspecified part of right lower leg with fat layer exposed: Secondary | ICD-10-CM | POA: Diagnosis not present

## 2021-12-12 DIAGNOSIS — E1151 Type 2 diabetes mellitus with diabetic peripheral angiopathy without gangrene: Secondary | ICD-10-CM | POA: Diagnosis not present

## 2021-12-12 DIAGNOSIS — L97521 Non-pressure chronic ulcer of other part of left foot limited to breakdown of skin: Secondary | ICD-10-CM | POA: Diagnosis not present

## 2021-12-12 DIAGNOSIS — E11622 Type 2 diabetes mellitus with other skin ulcer: Secondary | ICD-10-CM | POA: Diagnosis not present

## 2021-12-12 DIAGNOSIS — I87333 Chronic venous hypertension (idiopathic) with ulcer and inflammation of bilateral lower extremity: Secondary | ICD-10-CM | POA: Diagnosis not present

## 2021-12-12 DIAGNOSIS — L97511 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 of skin: Secondary | ICD-10-CM | POA: Diagnosis not present

## 2021-12-12 DIAGNOSIS — I890 Lymphedema, not elsewhere classified: Secondary | ICD-10-CM | POA: Diagnosis not present
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
## 2021-12-12 NOTE — Progress Notes (Addendum)
[name] ([id number]) Visit Report for 12/12/2021 Chief Complaint Document Details Patient Name: Date of Service: [name] 12/12/2021 10:30 A M Medical Record Number: [id number] Patient Account Number: [id number] Date of Birth/Sex: Treating RN: [birth year] (71 y.o. 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 12/12/2021 10:30 A M Medical Record Number: [id number] Patient Account Number: [id number] Date of Birth/Sex: Treating RN: October 10, 1951 (71 y.o. [name] Primary Care Provider: Nicoletta Ba Other Clinician: Referring Provider: Treating Provider/Extender: Adele Dan in Treatment: 307 Debridement Performed for Assessment: Wound #[id number] Left,Anterior Ankle Performed By: Physician Lenda Kelp, PA Debridement Type: Debridement Severity of Tissue Pre Debridement: Fat layer exposed Level of Consciousness (Pre-procedure): Awake and Alert Pre-procedure Verification/Time Out Yes - 11:10 Taken: Start Time: 11:11 Pain Control: Other : benzocaine 20% T Area Debrided (L x W): otal 1.5 (cm) x 3.9 (cm) = 5.85 (cm) Tissue and other material debrided: Viable, Non-Viable, Slough, Subcutaneous, Skin: Dermis , Skin: Epidermis, Fibrin/Exudate, Slough Level: Skin/Subcutaneous Tissue Debridement Description: Excisional Instrument: Curette Bleeding: Moderate Hemostasis Achieved: Pressure End Time: 11:16 Procedural Pain: 0 Post  Procedural Pain: 0 Response to Treatment: Procedure was tolerated well Level of Consciousness (Post- Awake and Alert procedure): Post Debridement Measurements of Total Wound Length: (cm) 1.5 Width: (cm) 3.9 Depth: (cm) 0.2 Volume: (cm) 0.919 Character of Wound/Ulcer Post Debridement: Improved Severity of Tissue Post Debridement: Fat layer exposed Post Procedure Diagnosis Same as Pre-procedure Electronic Signature(s) Signed: 12/12/2021 5:11:08 PM By: Lenda Kelp PA-C Signed: 12/12/2021 5:26:44 PM By: Shawn Stall RN, BSN Entered By: Shawn Stall on 12/12/2021 11:17:15 -------------------------------------------------------------------------------- HPI Details Patient Name: Date of Service: [name], [name] 12/12/2021 10:30 A M Medical Record Number: [id number] Patient Account Number: [id number] Date of Birth/Sex: Treating RN: 12/03/1950 (71 y.o. [name] Primary Care Provider: Nicoletta Ba Other Clinician: Referring Provider: Treating Provider/Extender: Adele Dan in Treatment: 307 History of Present Illness HPI Description: Referred by PCP for consultation. Patient has long standing history of BLE venous stasis, no prior ulcerations. At beginning of month, developed cellulitis and weeping. Received IM Rocephin followed by Keflex and resolved. Wears compression stocking, appr 6 months old. Not sure strength. No present drainage. 01/22/16 this is a patient who is a type II diabetic on insulin. 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 He tells me he religiously wears pressure stockings of uncertain strength. He was here with weeping edema about 8 months ago but did not have an open wound. Roughly a month ago he had a reopening on his bilateral legs. He is been using bandages and Neosporin. He does not complain of pain. 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 Last BUN/creatinine I have is from 11/20/15 at 13 and 1.0 respectively 01/29/16; patient arrives today having tolerated the Profore wrap. He brought in his stockings and these are 18 mmHg stockings he bought from [place]. The compression here is likely inadequate. He does not complain of pain or excessive drainage she has  no systemic symptoms. The wound on the right looks improved as does the one on the left although one on the left is more substantial with still tissue at risk below the actual wound area on the bilateral posterior calf 02/05/16; patient arrives with poor edema control. He states that we did put a 4 layer compression on it last week. No weight appear 5 this. 02/12/16; the area on the posterior right Has healed. The left Has a substantial wound that has necrotic surface eschar that requires a debridement with a curette. 02/16/16;the patient called or a Nurse visit secondary to increased swelling. He had been in earlier in the week with his right leg healed. He was transitioned to is on pressure stocking on the right leg with the only open wound on the left, a substantial area on the left posterior calf. Note he has a history of severe lower extremity edema, he has a history of chronic atrial fibrillation but not heart failure per my notes but I'll need to research this. He is not complaining of chest pain shortness of breath or orthopnea. The intake nurse noted blisters on the previously closed right leg 02/19/16; this is the patient's regular visit day. I see him on Friday with escalating edema new wounds on the right leg and clear signs of at least right ventricular heart failure. I increased his Lasix to 40 twice a day. He is returning currently in follow-up. States he is noticed a decrease in that the edema 02/26/16 patient's legs have much less edema. There is nothing really open on the right leg. The left leg has improved  condition of the large superficial wound on the posterior left leg 03/04/16; edema control is very much better. The patient's right leg wounds have healed. On the left leg he continues to have severe venous inflammation on the posterior aspect of the left leg. There is no tenderness and I don't think any of this is cellulitis. 03/11/16; patient's right leg is married healed and he is in his own stocking. The patient's left leg has deteriorated somewhat. There is a lot of erythema around the wound on the posterior left leg. There is also a significant rim of erythema posteriorly just above where the wrap would've ended there is a new wound in this location and a lot of tenderness. Can't rule out cellulitis in this area. 03/15/16; patient's right leg remains healed and he is in his own stocking. The patient's left leg is much better than last review. His major wound on the posterior aspect of his left Is almost fully epithelialized. He has 3 small injuries from the wraps. Really. Erythema seems a lot better on antibiotics 03/18/16; right leg remains healed and he is in his own stocking. The patient's left leg is much better. The area on the posterior aspect of the left calf is fully epithelialized. His 3 small injuries which were wrap injuries on the left are improved only one seems still open his erythema has resolved 03/25/16; patient's right leg remains healed and he is in his own stocking. There is no open area today on the left leg posterior leg is completely closed up. His wrap injuries at the superior aspect of his leg are also resolved. He looks as though he has some irritation on the dorsal ankle but this is fully epithelialized without evidence of infection. 03/28/16; we discharged this patient on Monday. Transitioned him into his own stocking. There were problems almost immediately with uncontrolled swelling weeping edema multiple  some of which have opened. He does not feel systemically unwell in  particular no chest pain no shortness of breath and he does not feel 04/08/16; the edema is under better control with the Profore light wrap but he still has pitting edema. There is one large wound anteriorly 2 on the medial aspect of his left leg and 3 small areas on the superior posterior calf. Drainage is not excessive he is tolerating a Profore light well 04/15/16; put a Profore wrap on him last week. This is controlled is edema however he had a lot of pain on his left anterior foot most of his wounds are healed 04/22/16 once again the patient has denuded areas on the left anterior foot which he states are because his wrap slips up word. He saw his primary physician today is on Lasix 40 twice a day and states that he his weight is down 20 pounds over the last 3 months. 04/29/16: Much improved. left anterior foot much improved. He is now on Lasix 80 mg per day. Much improved edema control 05/06/16; I was hoping to be able to discharge him today however once again he has blisters at a low level of where the compression was placed last week mostly on his left lateral but also his left medial leg and a small area on the anterior part of the left foot. 05/09/16; apparently the patient went home after his appointment on 7/4 later in the evening developing pain in his upper medial thigh together with subjective fever and chills although his temperature was not taken. The pain was so intense he felt he would probably have to call 911. However he then remembered that he had leftover doxycycline from a previous round of antibiotics and took these. By the next morning he felt a lot better. He called and spoke to one of our nurses and I approved doxycycline over the phone thinking that this was in relation to the wounds we had previously seen although they were definitely were not. The patient feels a lot better old fever no chills he is still working. Blood sugars are reasonably controlled 05/13/16; patient is back in  for review of his cellulitis on his anterior medial upper thigh. He is taking doxycycline this is a lot better. Culture I did of the nodular area on the dorsal aspect of his foot grew MRSA this also looks a lot better. 05/20/16; the patient is cellulitis on the medial upper thigh has resolved. All of his wound areas including the left anterior foot, areas on the medial aspect of the left calf and the lateral aspect of the calf at all resolved. He has a new blister on the left dorsal foot at the level of the fourth toe this was excised. No evidence of infection 05/27/16; patient continues to complain weeping edema. He has new blisterlike wounds on the left anterior lateral and posterior lateral calf at the top of his wrap levels. The area on his left anterior foot appears better. He is not complaining of fever, pain or pruritus in his feet. 05/30/16; the patient's blisters on his left anterior leg posterior calf all look improved. He did not increase the Lasix 100 mg as I suggested because he was going to run out of his 40 mg tablets. He is still having weeping edema of his toes 06/03/16; I renewed his Lasix at 80 mg once a day as he was about to run out when I last saw him. He is on 80 mg  of Lasix now. I have asked him to cut down on the excessive amount of water he was drinking and asked him to drink according to his thirst mechanisms 06/12/2016 -- was seen 2 days ago and was supposed to wear his compression stockings at home but he is developed lymphedema and superficial blisters on the left lower extremity and hence came in for a review 06/24/16; the remaining wound is on his left anterior leg. He still has edema coming from between his toes. There is lymphedema here however his edema is generally better than when I last saw this. He has a history of atrial fibrillation but does not have a known history of congestive heart failure nevertheless I think he probably has this at least on a diastolic  basis. 07/01/16 I reviewed his echocardiogram from January 2017. This was essentially normal. He did not have LVH, EF of 55-60%. His right ventricular function was normal although he did have trivial tricuspid and pulmonic regurgitation. This is not audible on exam however. I increased his Lasix to do massive edema in his legs well above his knees I think in early July. He was also drinking an excessive amount of water at the time. 07/15/16; missed his appointment last week because of the Labor Day holiday on Monday. He could not get another appointment later in the week. Started to feel the wrap digging in superiorly so we remove the top half and the bottom half of his wrap. He has extensive erythema and blistering superiorly in the left leg. Very tender. Very swollen. Edema in his foot with leaking edema fluid. He has not been systemically unwell 07/22/16; the area on the left leg laterally required some debridement. The medial wounds look more stable. His wrap injury wounds appear to have healed. Edema and his foot is better, weeping edema is also better. He tells me he is meeting with the supplier of the external compression pumps at work 08/05/16; the patient was on vacation last week in [facility]. His wrap is been on for an extended period of time. Also over the weekend he developed an extensive area of tender erythema across his anterior medial thigh. He took to doxycycline yesterday that he had leftover from a previous prescription. The patient complains of weeping edema coming out of his toes 08/08/16; I saw this patient on 10/2. He was tender across his anterior thigh. I put him on doxycycline. He returns today in follow-up. He does not have any open wounds on his lower leg, he still has edema weeping into his toes. 08/12/16; patient was seen back urgently today to follow-up for his extensive left thigh cellulitis/erysipelas. He comes back with a lot less swelling and erythema pain is much  better. I believe I gave him Augmentin and Cipro. His wrap was cut down as he stated a roll down his legs. He developed blistering above the level of the wrap that remained. He has 2 open blisters and 1 intact. 08/19/16; patient is been doing his primary doctor who is increased his Lasix from 40-80 once a day or 80 already has less edema. Cellulitis has remained improved in the left thigh. 2 open areas on the posterior left calf 08/26/16; he returns today having new open blisters on the anterior part of his left leg. He has his compression pumps but is not yet been shown how to use some vital representative from the supplier. 09/02/16 patient returns today with no open wounds on the left leg. Some maceration in  his plantar toes 09/10/2016 -- Dr. Leanord Hawking had recently discharged him on 09/02/2016 and he has come right back with redness swelling and some open ulcers on his left lower extremity. He says this was caused by trying to apply his compression stockings and he's been unable to use this and has not been able to use his lymphedema pumps. He had some doxycycline leftover and he has started on this a few days ago. 09/16/16; there are no open wounds on his leg on the left and no evidence of cellulitis. 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 He does not complain of symptoms here. I am not clear use using his external compression pumps. 09/23/16; I have not seen this patient in 2 weeks. He canceled his appointment 10 days ago as he was going on vacation. He tells me that on Monday he noticed a large area on his posterior left leg which is been draining copiously and is reopened into a large wound. He is been using ABDs and the external part of his juxtalite, according to our nurse this was not on properly. 10/07/16; Still a substantial area on the posterior left leg. Using silver alginate 10/14/16; in general better although there is still  open area which looks healthy. Still using silver alginate. He reminds me that this happen before he left for [facility]. T oday while he was showering in the morning. He had been using his juxtalite's 10/21/16; the area on his posterior left leg is fully epithelialized. However he arrives today with a large area of tender erythema in his medial and posterior left thigh just above the knee. I have marked the area. Once again he is reluctant to consider hospitalization. I treated him with oral antibiotics in the past for a similar situation with resolution I think with doxycycline however this area it seems more extensive to me. He is not complaining of fever but does have chills and says states he is thirsty. His blood sugar today was in the 140s at home 10/25/16 the area on his posterior left leg is fully epithelialized although there is still some weeping edema. The large area of tenderness and erythema in his medial and posterior left thigh is a lot less tender although there is still a lot of swelling in this thigh. He states he feels a lot better. He is on doxycycline and Augmentin that I started last week. This will continued until Tuesday, December 26. I have ordered a duplex ultrasound of the left thigh rule out DVT whether there is an abscess something that would need to be drained I would also like to know. 11/01/16; he still has weeping edema from a not fully epithelialized area on his left posterior calf. Most of the rest of this looks a lot better. He has completed his antibiotics. His thigh is a lot better. Duplex ultrasound did not show a DVT in the thigh 11/08/16; he comes in today with more Denuded surface epithelium from the posterior aspect of his calf. There is no real evidence of cellulitis. The superior aspect of his wrap appears to have put quite an indentation in his leg just below the knee and this may have contributed. He does not complain of pain or fever. We have been using  silver alginate as the primary dressing. The area of cellulitis in the right thigh has totally resolved. He has been using his compression stockings once a week 11/15/16; the patient arrives  today with more loss of epithelium from the posterior aspect of his left calf. He now has a fairly substantial wound in this area. The reason behind this deterioration isn't exactly clear although his edema is not well controlled. He states he feels he is generally more swollen systemically. He is not complaining of chest pain shortness of breath fever. T me he has an appointment with his primary physician in early February. He is on 80 mg of oral ells Lasix a day. He claims compliance with the external compression pumps. He is not having any pain in his legs similar to what he has with his recurrent cellulitis 11/22/16; the patient arrives a follow-up of his large area on his left lateral calf. This looks somewhat better today. He came in earlier in the week for a dressing change since I saw him a week ago. He is not complaining of any pain no shortness of breath no chest pain 11/28/16; the patient arrives for follow-up of his large area on the left lateral calf this does not look better. In fact it is larger weeping edema. The surface of the wound does not look too bad. We have been using silver alginate although I'm not certain that this is a dressing issue. 12/05/16; again the patient follows up for a large wound on the left lateral and left posterior calf this does not look better. There continues to be weeping edema necrotic surface tissue. More worrisome than this once again there is erythema below the wound involving the distal Achilles and heel suggestive of cellulitis. He is on his feet working most of the day of this is not going well. We are changing his dressing twice a week to facilitate the drainage. 12/12/16; not much change in the overall dimensions of the large area on the left posterior calf. This is  very inflamed looking. I gave him an. Doxycycline last week does not really seem to have helped. He found the wrap very painful indeed it seems to of dog into his legs superiorly and perhaps around the heel. He came in early today because the drainage had soaked through his dressings. 12/19/16- patient arrives for follow-up evaluation of his left lower extremity ulcers. He states that he is using his lymphedema pumps once daily when there is "no drainage". He admits to not using his lipedema pumps while under current treatment. His blood sugars have been consistently between 150-200. 12/26/16; the patient is not using his compression pumps at home because of the wetness on his feet. I've advised him that I think it's important for him to use this daily. He finds his feet too wet, he can put a plastic bag over his legs while he is in the pumps. Otherwise I think will be in a vicious circle. We are using silver alginate to the major area on his left posterior calf 01/02/17; the patient's posterior left leg has further of all into 3 open wounds. All of them covered with a necrotic surface. He claims to be using his compression pumps once a day. His edema control is marginal. Continue with silver alginate 01/10/17; the patient's left posterior leg actually looks somewhat better. There is less edema, less erythema. Still has 3 open areas covered with a necrotic surface requiring debridement. He claims to be using his compression pumps once a day his edema control is better 01/17/17; the patient's left posterior calf look better last week when I saw him and his wrap was changed 2 days ago. He  has noted increasing pain in the left heel and arrives today with much larger wounds extensive erythema extending down into the entire heel area especially tender medially. He is not systemically unwell CBGs have been controlled no fever. Our intake nurse showed me limegreen drainage on his AVD pads. 01/24/17; his usual this  patient responds nicely to antibiotics last week giving him Levaquin for presumed Pseudomonas. The whole entire posterior part of his leg is much better much less inflamed and in the case of his Achilles heel area much less tender. He has also had some epithelialization posteriorly there are still open areas here and still draining but overall considerably better 01/31/17- He has continue to tolerate the compression wraps. he states that he continues to use the lymphedema pumps daily, and can increase to twice daily on the weekends. He is voicing no complaints or concerns regarding his LLE ulcers 02/07/17-he is here for follow-up evaluation. He states that he noted some erythema to the left medial and anterior thigh, which he states is new as of yesterday. He is concerned about recurrent cellulitis. He states his blood sugars have been slightly elevated, this morning in the 180s 02/14/17; he is here for follow-up evaluation. When he was last here there was erythema superiorly from his posterior wound in his anterior thigh. He was prescribed Levaquin however a culture of the wound surface grew MRSA over the phone I changed him to doxycycline on Monday and things seem to be a lot better. 02/24/17; patient missed his appointment on Friday therefore we changed his nurse visit into a physician visit today. Still using silver alginate on the large area of the posterior left thigh. He isn't new area on the dorsal left second toe 03/03/17; actually better today although he admits he has not used his external compression pumps in the last 2 days or so because of work responsibilities over the weekend. 03/10/17; continued improvement. External compression pumps once a day almost all of his wounds have closed on the posterior left calf. Better edema control 03/17/17; in general improved. He still has 3 small open areas on the lateral aspect of his left leg however most of the area on the posterior part of his leg  is epithelialized. He has better edema control. He has an ABD pad under his stocking on the right anterior lower leg although he did not let us look at that today. 03/24/17; patient arrives back in clinic today with no open areas however there are areas on the posterior left calf and anterior left calf that are less than 100% epithelialized. His edema is well controlled in the left lower leg. There is some pitting edema probably lymphedema in the left upper thigh. He uses compression pumps at home once per day. I tried to get him to do this twice a day although he is very reticent. 04/01/2017 -- for the last 2 days he's had significant redness, tenderness and weeping and came in for an urgent visit today. 04/07/17; patient still has 6 more days of doxycycline. He was seen by Dr. Meyer Russel last Wednesday for cellulitis involving the posterior aspect, lateral aspect of his Involving his heel. For the most part he is better there is less erythema and less weeping. He has been on his feet for 12 hours 2 over the weekend. Using his compression pumps once a day 04/14/17 arrives today with continued improvement. Only one area on the posterior left calf that is not fully epithelialized. He has intense bilateral venous  inflammation associated with his chronic venous insufficiency disease and secondary lymphedema. We have been using silver alginate to the left posterior calf wound In passing he tells [place] today that the right leg but we have not seen in quite some time has an open area on it but he doesn't want [place] to look at this today states he will show this to [place] next week. 04/21/17; there is no open area on his left leg although he still reports some weeping edema. He showed [place] his right leg today which is the first time we've seen this leg in a long time. He has a large area of open wound on the right leg anteriorly healthy granulation. Quite a bit of swelling in the right leg and some degree of venous  inflammation. He told us about the right leg in passing last week but states that deterioration in the right leg really only happened over the weekend 04/28/17; there is no open area on the left leg although there is an irritated part on the posterior which is like a wrap injury. The wound on the right leg which was new from last week at least to [place] is a lot better. 05/05/17; still no open area on the left leg. Patient is using his new compression stocking which seems to be doing a good job of controlling the edema. He states he is using his compression pumps once per day. The right leg still has an open wound although it is better in terms of surface area. Required debridement. A lot of pain in the posterior right Achilles marked tenderness. Usually this type of presentation this patient gives concern for an active cellulitis 05/12/17; patient arrives today with his major wound from last week on the right lateral leg somewhat better. Still requiring debridement. He was using his compression stocking on the left leg however that is reopened with superficial wounds anteriorly he did not have an open wound on this leg previously. He is still using his juxta light's once daily at night. He cannot find the time to do this in the morning as he has to be at work by 7 AM 05/19/17; right lateral leg wound looks improved. No debridement required. The concerning area is on the left posterior leg which appears to almost have a subcutaneous hemorrhagic component to it. We've been using silver alginate to all the wounds 05/26/17; the right lateral leg wound continues to look improved. However the area on the left posterior calf is a tightly adherent surface. [place] using silver alginate. Because of the weeping edema in his legs there is very little good alternatives. 06/02/17; the patient left here last week looking quite good. Major wound on the left posterior calf and a small one on the right lateral calf. Both of these  look satisfactory. He tells me that by Wednesday he had noted increased pain in the left leg and drainage. He called on Thursday and Friday to get an appointment here but we were blocked. He did not go to urgent care or his primary physician. He thinks he had a fever on Thursday but did not actually take his temperature. He has not been using his compression pumps on the left leg because of pain. I advised him to go to the emergency room today for IV antibiotics for stents of left leg cellulitis but he has refused I have asked him to take 2 days off work to keep his leg elevated and he has refused this as well. In  view of this I'm going to call him and Augmentin and doxycycline. 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 However after a detailed discussion regarding the risk benefits and alternatives he refuses to get admitted to the hospital. With no other recourse I will continue him on oral antibiotics as before and hopefully he'll have his infectious disease consultation this week. 06/16/2017 -- the patient was seen today by the nurse practitioner at infectious disease Ms. Dixon. 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 He has also been advise Lotrimin cream locally. she also had wise IV antibiotics for his condition if it flares up 06/23/17; patient arrives today with drainage bilaterally although the remaining wound on the left posterior calf after cleaning up today "highlighter yellow drainage" did not look too bad. Unfortunately he has had breakdown on the right anterior leg  [previously this leg had not been open and he is using a black stocking] he went to see infectious disease and is been put on clindamycin 150 mg daily, I did not verify the dose although I'm not familiar with using clindamycin in this dosing range, perhaps for prophylaxisoo 06/27/17; I brought this patient back today to follow-up on the wound deterioration on the right lower leg together with surrounding cellulitis. I started him on doxycycline 4 days ago. This area looks better however he comes in today with intense cellulitis on the medial part of his left thigh. This is not have a wound in this area. Extremely tender. We've been using silver alginate to the wounds on the right lower leg left lower leg with bilateral 4 layer compression he is using his external compression pumps once a day 07/04/17; patient's left medial thigh cellulitis looks better. He has not been using his compression pumps as his insert said it was contraindicated with cellulitis. His right leg continues to make improvements all the wounds are still open. We only have one remaining wound on the left posterior calf. Using silver alginate to all open areas. He is on doxycycline which I started a week ago and should be finishing I gave him Augmentin after Thursday's visit for the severe cellulitis on the left medial thigh which fortunately looks better 07/14/17; the patient's left medial thigh cellulitis has resolved. The cellulitis in his right lower calf on the right also looks better. All of his wounds are stable to improved we've been using silver alginate he has completed the antibiotics I have given him. He has clindamycin 150 mg once a day prescribed by infectious disease for prophylaxis, I've advised him to start this now. We have been using bilateral Unna boots over silver alginate to the wound areas 07/21/17; the patient is been to see infectious disease who noted his recurrent problems with cellulitis. He was not able to  tolerate prophylactic clindamycin therefore he is on amoxicillin 500 twice a day. He also had a second daily dose of Lasix added By Dr. Oneta Rack but he is not taking this. Nor is he being completely compliant with his compression pumps a especially not this week. He has  2 remaining wounds one on the right posterior lateral lower leg and one on the left posterior medial lower leg. 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 The patient has Unna boots bilaterally. 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 He has bilateral Unna boots for compression and silver alginate to his wounds. Arrives today with his legs looking as good as I have seen him in quite some time. Not surprisingly his wounds look better as well with improvement on the right lateral leg venous insufficiency wound and also the left medial leg. He is still using the compression pumps once a day 08/11/17; both legs appear to be doing better wounds on the right lateral and left medial legs look better. Skin on the right leg quite good. He is been using silver alginate as the primary dressing. I'm going to use Anasept gel calcium alginate and maintain all the secondary dressings 08/18/17; the patient continues to actually do quite well. The area on his right lateral leg is just about closed the left medial also looks better although it is still moist in this area. His edema is well controlled we have been using Anasept gel with calcium alginate and the usual secondary dressings, 4 layer compression and once daily use of his compression pumps "always been able to manage 09/01/17; the patient continues to do reasonably well in spite of his trip to [place]. The area on the right lateral leg is epithelialized. Left is much  better but still open. He has more edema and more chronic erythema on the left leg [venous inflammation] 09/08/17; he arrives today with no open wound on the right lateral leg and decently controlled edema. Unfortunately his left leg is not nearly as in his good situation as last week.he apparently had increasing edema starting on Saturday. He edema soaked through into his foot so used a plastic bag to walk around his home. The area on the medial right leg which was his open area is about the same however he has lost surface epithelium on the left lateral which is new and he has significant pain in the Achilles area of the left foot. He is already on amoxicillin chronically for prophylaxis of cellulitis in the left leg 09/15/17; he is completed a week of doxycycline and the cellulitis in the left posterior leg and Achilles area is as usual improved. He still has a lot of edema and fluid soaking through his dressings. There is no open wound on the right leg. He saw infectious disease NP today 09/22/17;As usual 1 we transition him from our compression wraps to his stockings things did not go well. He has several small open areas on the right leg. He states this was caused by the compression wrap on his skin although he did not wear this with the stockings over them. He has several superficial areas on the left leg medially laterally posteriorly. He does not have any evidence of active cellulitis especially involving the left Achilles The patient is traveling from [facility] Saturday going to [facility]. He states he isn't attempting to get an appointment with a heel objects wound center there to change his dressings. I am not completely certain whether this will work 10/06/17; the patient came in on Friday for a nurse visit and the nurse  reported that his legs actually look quite good. He arrives in clinic today for his regular follow-up visit. He has a new wound on his left third toe over the PIP  probably caused by friction with his footwear. He has small areas on the left leg and a very superficial but epithelialized area on the right anterior lateral lower leg. Other than that his legs look as good as I've seen him in quite some time. We have been using silver alginate Review of systems; no chest pain no shortness of breath other than this a 10 point review of systems negative 10/20/17; seen by Dr. Meyer Russel last week. He had taken some antibiotics [doxycycline] that he had left over. Dr. Meyer Russel thought he had candida infection and declined to give him further antibiotics. He has a small wound remaining on the right lateral leg several areas on the left leg including a larger area on the left posterior several left medial and anterior and a small wound on the left lateral. The area on the left dorsal third toe looks a lot better. ROS; Gen.; no fever, respiratory no cough no sputum Cardiac no chest pain other than this 10 point review of system is negative 10/30/17; patient arrives today having fallen in the bathtub 3 days ago. It took him a while to get up. He has pain and maceration in the wounds on his left leg which have deteriorated. He has not been using his pumps he also has some maceration on the right lateral leg. 11/03/17; patient continues to have weeping edema especially in the left leg. This saturates his dressings which were just put on on 12/27. As usual the doxycycline seems to take care of the cellulitis on his lower leg. He is not complaining of fever, chills, or other systemic symptoms. He states his leg feels a lot better on the doxycycline I gave him empirically. He also apparently gets injections at his primary doctor's officeo Rocephin for cellulitis prophylaxis. I didn't ask him about his compression pump compliance today I think that's probably marginal. Arrives in the clinic with all of his dressings primary and secondary macerated full of fluid and he has bilateral  edema 11/10/17; the patient's right leg looks some better although there is still a cluster of wounds on the right lateral. The left leg is inflamed with almost circumferential skin loss medially to laterally although we are still maintaining anteriorly. He does not have overt cellulitis there is a lot of drainage. He is not using compression pumps. We have been using silver alginate to the wound areas, there are not a lot of options here 11/17/17; the patient's right leg continues to be stable although there is still open wounds, better than last week. The inflammation in the left leg is better. Still loss of surface layer epithelium especially posteriorly. There is no overt cellulitis in the amount of edema and his left leg is really quite good, tells me he is using his compression pumps once a day. 11/24/17; patient's right leg has a small superficial wound laterally this continues to improve. The inflammation in the left leg is still improving however we have continuous surface layer epithelial loss posteriorly. There is no overt cellulitis in the amount of edema in both legs is really quite good. He states he is using his compression pumps on the left leg once a day for 5 out of 7 days 12/01/17; very small superficial areas on the right lateral leg continue to improve. Edema control in  both legs is better today. He has continued loss of surface epithelialization and left posterior calf although I think this is better. We have been using silver alginate with large number of absorptive secondary dressings 4 layer on the left Unna boot on the right at his request. He tells me he is using his compression pumps once a day 12/08/17; he has no open area on the right leg is edema control is good here. On the left leg however he has marked erythema and tenderness breakdown of skin. He has what appears to be a wrap injury just distal to the popliteal fossa. This is the pattern of his recurrent cellulitis area and  he apparently received penicillin at his primary physician's office really worked in my view but usually response to doxycycline given it to him several times in the past 12/15/17; the patient had already deteriorated last Friday when he came in for his nurse check. There was swelling erythema and breakdown in the right leg. He has much worse skin breakdown in the left leg as well multiple open areas medially and posteriorly as well as laterally. He tells me he has been using his compression pumps but tells me he feels that the drainage out of his leg is worse when he uses a compression pumps. T be fair to him he is been saying this o for a while however I don't know that I have really been listening to this. I wonder if the compression pumps are working properly 12/22/17;. Once again he arrives with severe erythema, weeping edema from the left greater than right leg. Noncompliance with compression pumps. New this visit he is complaining of pain on the lateral aspect of the right leg and the medial aspect of his right thigh. He apparently saw his cardiologist Dr. [name] who was ordered an echocardiogram area and I think this is a step in the right direction 12/25/17; started his doxycycline Monday night. There is still intense erythema of the right leg especially in the anterior thigh although there is less tenderness. The erythema around the wound on the right lateral calf also is less tender. He still complaining of pain in the left heel. His wounds are about the same right lateral left medial left lateral. Superficial but certainly not close to closure. He denies being systemically unwell no fever chills no abdominal pain no diarrhea 12/29/17; back in follow-up of his extensive right calf and right thigh cellulitis. I added amoxicillin to cover possible doxycycline resistant strep. This seems to of done the trick he is in much less pain there is much less erythema and swelling. He has his echocardiogram  at 11:00 this morning. X-ray of the left heel was also negative. 01/05/18; the patient arrived with his edema under much better control. Now that he is retired he is able to use his compression pumps daily and sometimes twice a day per the patient. He has a wound on the right leg the lateral wound looks better. Area on the left leg also looks a lot better. He has no evidence of cellulitis in his bilateral thighs I had a quick peak at his echocardiogram. He is in normal ejection fraction and normal left ventricular function. He has moderate pulmonary hypertension moderately reduced right ventricular function. One would have to wonder about chronic sleep apnea although he says he doesn't snore. He'll review the echocardiogram with his cardiologist. 01/12/18; the patient arrives with the edema in both legs under exemplary control. He is using his compression  pumps daily and sometimes twice daily. His wound on the right lateral leg is just about closed. He still has some weeping areas on the posterior left calf and lateral left calf although everything is just about closed here as well. I have spoken with Aldean Baker who is the patient's nurse practitioner and infectious disease. She was concerned that the patient had not understood that the parenteral penicillin injections he was receiving for cellulitis prophylaxis was actually benefiting him. I don't think the patient actually saw that I would tend to agree we were certainly dealing with less infections although he had a serious one last month. 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 He is healed. He'll be placed in juxtalite compression wraps and increase his lymphedema pumps to twice daily. We will follow up again next week to ensure there are no issues with the new regiment. 01/20/18-he is here for evaluation of bilateral lower extremity weeping edema. Yesterday he was placed in compression wrap to the right  lower extremity and compression stocking to left lower shrubbery. He states he uses lymphedema pumps last night and again this morning and noted a blister to the left lower extremity. On exam he was noted to have drainage to the right lower extremity. He will be placed in Unna boots bilaterally and follow-up next week 01/26/18; patient was actually discharged a week ago to his own juxta light stockings only to return the next day with bilateral lower extremity weeping edema.he was placed in bilateral Unna boots. He arrives today with pain in the back of his left leg. There is no open area on the right leg however there is a linear/wrap injury on the left leg and weeping edema on the left leg posteriorly. I spoke with infectious disease about 10 days ago. They were disappointed that the patient elected to discontinue prophylactic intramuscular penicillin shots as they felt it was particularly beneficial in reducing the frequency of his cellulitis. I discussed this with the patient today. He does not share this view. He'll definitely need antibiotics today. Finally he is traveling to [place] and trauma leaving this Saturday and returning a week later and he does not travel with his pumps. He is going by car 01/30/18; patient was seen 4 days ago and brought back in today for review of cellulitis in the left leg posteriorly. I put him on amoxicillin this really hasn't helped as much as I might like. He is also worried because he is traveling to [facility] by car. Finally we will be rewrapping him. There is no open area on the right leg over his left leg has multiple weeping areas as usual 02/09/18; The same wrap on for 10 days. He did not pick up the last doxycycline I prescribed for him. He apparently took 4 days worth he already had. There is nothing open on his right leg and the edema control is really quite good. He's had damage in the left leg medially and laterally especially probably related to  the prolonged use of Unna boots 02/12/18; the patient arrived in clinic today for a nurse visit/wrap change. He complained of a lot of pain in the left posterior calf. He is taking doxycycline that I previously prescribed for him. Unfortunately even though he used his stockings and apparently used to compression pumps twice a day he has weeping edema coming out of the lateral part of his right leg. This is coming from the lower anterior lateral skin area.  02/16/18; the patient has finished his doxycycline and will finish the amoxicillin 2 days. The area of cellulitis in the left calf posteriorly has resolved. He is no longer having any pain. He tells me he is using his compression pumps at least once a day sometimes twice. 02/23/18; the patient finished his doxycycline and Amoxil last week. On Friday he noticed a small erythematous circle about the size of a quarter on the left lower leg just above his ankle. This rapidly expanded and he now has erythema on the lateral and posterior part of the thigh. This is bright red. Also has an area on the dorsal foot just above his toes and a tender area just below the left popliteal fossa. He came off his prophylactic penicillin injections at his own insistence one or 2 months ago. This is obviously deteriorated since then 03/02/18; patient is on doxycycline and Amoxil. Culture I did last week of the weeping area on the back of his left calf grew group B strep. I have therefore renewed the amoxicillin 500 3 times a day for a further week. He has not been systemically unwell. Still complaining of an area of discomfort right under his left popliteal fossa. There is no open wound on the right leg. He tells me that he is using his pumps twice a day on most days 03/09/18; patient arrives in clinic today completing his amoxicillin today. The cellulitis on his left leg is better. Furthermore he tells me that he had intramuscular penicillin shots that his primary care office  today. However he also states that the wrap on his right leg fell down shortly after leaving clinic last week. He developed a large blister that was present when he came in for a nurse visit later in the week and then he developed intense discomfort around this area.He tells me he is using his compression pumps 03/16/18; the patient has completed his doxycycline. The infectious part of this/cellulitis in the left heel area left popliteal area is a lot better. He has 2 open areas on the right calf. Still areas on the left calf but this is a lot better as well. 03/24/18; the patient arrives complaining of pain in the left popliteal area again. He thinks some of this is wrap injury. He has no open area on the right leg and really no open area on the left calf either except for the popliteal area. He claims to be compliant with the compression pumps 03/31/18; I gave him doxycycline last week because of cellulitis in the left popliteal area. This is a lot better although the surface epithelium is denuded off and response to this. He arrives today with uncontrolled edema in the right calf area as well as a fingernail injury in the right lateral calf. There is only a few open areas on the left 04/06/18; I gave him amoxicillin doxycycline over the last 2 weeks that the amoxicillin should be completing currently. He is not complaining of any pain or systemic symptoms. The only open areas see has is on the right lateral lower leg paradoxically I cannot see anything on the left lower leg. He tells me he is using his compression pumps twice a day on most days. Silver alginate to the wounds that are open under 4 layer compression 04/13/18; he completed antibiotics and has no new complaints. Using his compression pumps. Silver alginate that anything that's opened 04/20/18; he is using his compression pumps religiously. Silver alginate 4 layer compression anything that's opened. He  comes in today with no open wounds on the  left leg but 3 on the right including a new one posteriorly. He has 2 on the right lateral and one on the right posterior. He likes Unna boots on the right leg for reasons that aren't really clear we had the usual 4 layer compression on the left. It may be necessary to move to the 4 layer compression on the right however for now I left them in the Unna boots 04/27/18; he is using his compression pumps at least once a day. He has still the wounds on the right lateral calf. The area right posteriorly has closed. He does not have an open wound on the left under 4 layer compression however on the dorsal left foot just proximal to the toes and the left third toe 2 small open areas were identified 05/11/18; he has not uses compression pumps. The areas on the right lateral calf have coalesced into one large wound necrotic surface. On the left side he has one small wound anteriorly however the edema is now weeping out of a large part of his left leg. He says he wasn't using his pumps because of the weeping fluid. I explained to him that this is the time he needs to pump more 05/18/18; patient states he is using his compression pumps twice a day. The area on the right lateral large wound albeit superficial. On the left side he has innumerable number of small new wounds on the left calf particularly laterally but several anteriorly and medially. All these appear to have healthy granulated base these look like the remnants of blisters however they occurred under compression. The patient arrives in clinic today with his legs somewhat better. There is certainly less edema, less multiple open areas on the left calf and the right anterior leg looks somewhat better as well superficial and a little smaller. However he relates pain and erythema over the last 3-4 days in the thigh and I looked at this today. He has not been systemically unwell no fever no chills no change in blood sugar values 05/25/18; comes in today in a  better state. The severe cellulitis on his left leg seems better with the Keflex. Not as tender. He has not been systemically unwell Hard to find an open wound on the left lower leg using his compression pumps twice a day The confluent wounds on his right lateral calf somewhat better looking. These will ultimately need debridement I didn't do this today. 06/01/18; the severe cellulitis on the left anterior thigh has resolved and he is completed his Keflex. There is no open wound on the left leg however there is a superficial excoriation at the base of the third toe dorsally. Skin on the bottom of his left foot is macerated looking. The left the wounds on the lateral right leg actually looks some better although he did require debridement of the top half of this wound area with an open curet 06/09/18 on evaluation today patient appears to be doing poorly in regard to his right lower extremity in particular this appears to likely be infected he has very thick purulent discharge along with a bright green tent to the discharge. This makes me concerned about the possibility of pseudomonas. He's also having increased discomfort at this point on evaluation. Fortunately there does not appear to be any evidence of infection spreading to the other location at this time. 06/16/18 on evaluation today patient appears to actually be doing fairly well.  His ulcer has actually diminished in size quite significantly at this point which is good news. Nonetheless he still does have some evidence of infection he did see infectious disease this morning before coming here for his appointment. I did review the results of their evaluation and their note today. They did actually have him discontinue the Cipro and initiate treatment with linezolid at this time. He is doing this for the next seven days and they recommended a follow-up in four months with them. He is the keep a log of the need for intermittent antibiotic therapy  between now and when he falls back up with infectious disease. This will help them gaze what exactly they need to do to try and help them out. 06/23/18; the patient arrives today with no open wounds on the left leg and left third toe healed. He is been using his compression pumps twice a day. On the right lateral leg he still has a sizable wound but this is a lot better than last time I saw this. 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 Has been using silver alginate under 4 layer compression 06/30/18; the only open wound he has is on the right lateral leg and this looks healthy. No debridement is required. We have been using silver alginate. He does not have an open wound on the left leg. There is apparently some drainage from the dorsal proximal third toe on the left although I see no open wound here. 07/03/18 on evaluation today patient was actually here just for a nurse visit rapid change. However when he was here on Wednesday for his rat change due to having been healed on the left and then developing blisters we initiated the wrap again knowing that he would be back today for [place] to reevaluate and see were at. Unfortunately he has developed some cellulitis into the proximal portion of his right lower extremity even into the region of his thigh. 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 He was placed on one as what at that point. Nonetheless he is done with that and has been tolerating it well otherwise. Doxycycline which in the past really did not seem to be effective for him. Nonetheless I think the best option may be for [place] to definitely reinitiate the antibiotics for a longer period of time. 07/07/18; since I last saw this patient a week ago he has had a difficult time. At that point he did not have an open wound on his left leg. We transitioned him  into juxta light stockings. He was apparently in the clinic the next day with blisters on the left lateral and left medial lower calf. He also had weeping edema fluid. He was put back into a compression wrap. He was also in the clinic on Friday with intense erythema in his right thigh. Per the patient he was started on Bactrim however that didn't work at all in terms of relieving his pain and swelling. He has taken 3 doxycycline that he had left over from last time and that seems to of helped. He has blistering on the right thigh as well. 07/14/18; the erythema on his right thigh has gotten better with doxycycline that he is finishing. 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  Presumably this cellulitis in the thigh was not related to that although I think this is a bit concerning going forward. He still has an area on the right lateral calf the blister on the right medial calf just below the knee that was discussed above. On the left 2 small open areas left medial and left lateral. Edema control is adequate. He is using his compression pumps twice a day 07/20/18; continued improvement in the condition of both legs especially the edema in his bilateral thighs. He tells me he is been losing weight through a combination of diet and exercise. He is using his compression pumps twice a day. So overall she made to the remaining wounds 07/27/2018; continued improvement in condition of both legs. His edema is well controlled. The area on the right lateral leg is just about closed he had one blisters show up on the medial left upper calf. We have him in 4 layer compression. He is going on a 10-day trip to [place], [place]. He will be driving. He wants to wear Unna boots because of the lessening amount of constriction. He will not use compression pumps while he is away 08/05/18 on evaluation today patient actually appears  to be doing decently well all things considered in regard to his bilateral lower extremities. The worst ulcer is actually only posterior aspect of his left lower extremity with a four layer compression wrap cut into his leg a couple weeks back. He did have a trip and actually had [employer] for the trip that he is worn since he was last here. Nonetheless he feels like the [employer] actually do better for him his swelling is up a little bit but he also with his trip was not taking his Lasix on a regular set schedule like he was supposed to be. He states that obviously the reason being that he cannot drive and keep going without having to urinate too frequently which makes it difficult. He did not have his pumps with him while he was away either which I think also maybe playing a role here too. 08/13/2018; the patient only has a small open wound on the right lateral calf which is a big improvement in the last month or 2. He also has the area posteriorly just below the posterior fossa on the left which I think was a wrap injury from several weeks ago. He has no current evidence of cellulitis. He tells me he is back into his compression pumps twice a day. He also tells me that while he was at the laundromat somebody stole a section of his extremitease stockings 08/20/2018; back in the clinic with a much improved state. He only has small areas on the right lateral mid calf which is just about healed. This was is more substantial area for quite a prolonged period of time. He has a small open area on the left anterior tibia. The area on the posterior calf just below the popliteal fossa is closed today. He is using his compression pumps twice a day 08/28/2018; patient has no open wound on the right leg. He has a smattering of open areas on the calf with some weeping lymphedema. More problematically than that it looks as though his wraps of slipped down in his usual he has very angry upper area of  edema just below the right medial knee and on the right lateral calf. He has no open area on his feet. The patient is traveling  to [facility] next week. I will send him in an antibiotic. We will continue to wrap the right leg. We ordered extremitease stockings for him last week and I plan to transition the right leg to a stocking when he gets home which will be in 10 days time. As usual he is very reluctant to take his pumps with him when he travels 09/07/2018; patient returns from [facility]. He shows me a picture of his left leg in the mid part of his trip last week with intense fire engine erythema. The picture look bad enough I would have considered sending him to the hospital. Instead he went to the wound care center in [facility]. They did not prescribe him antibiotics but he did take some doxycycline he had leftover from a previous visit. I had given him trimethoprim sulfamethoxazole before he left this did not work according to the patient. This is resulted in some improvement fortunately. He comes back with a large wound on the left posterior calf. Smaller area on the left anterior tibia. Denuded blisters on the dorsal left foot over his toes. Does not have much in the way of wounds on the right leg although he does have a very tender area on the right posterior area just below the popliteal fossa also suggestive of infection. He promises me he is back on his pumps twice a day 09/15/2018; the intense cellulitis in his left lower calf is a lot better. The wound area on the posterior left calf is also so better. However he has reasonably extensive wounds on the dorsal aspect of his second and third toes and the proximal foot just at the base of the toes. There is nothing open on the right leg 09/22/2018; the patient has excellent edema control in his legs bilaterally. He is using his external compression pumps twice a day. He has no open area on the right leg and only the areas  in the left foot dorsally second and third toe area on the left side. He does not have any signs of active cellulitis. 10/06/2018; the patient has good edema control bilaterally. He has no open wound on the right leg. There is a blister in the posterior aspect of his left calf that we had to deal with today. He is using his compression pumps twice a day. There is no signs of active cellulitis. We have been using silver alginate to the wound areas. He still has vulnerable areas on the base of his left first second toes dorsally He has a his extremities stockings and we are going to transition him today into the stocking on the right leg. He is cautioned that he will need to continue to use the compression pumps twice a day. If he notices uncontrolled edema in the right leg he may need to go to 3 times a day. 10/13/2018; the patient came in for a nurse check on Friday he has a large flaccid blister on the right medial calf just below the knee. We unroofed this. He has this and a new area underneath the posterior mid calf which was undoubtedly a blister as well. He also has several small areas on the right which is the area we put his extremities stocking on. 10/19/2018; the patient went to see infectious disease this morning I am not sure if that was a routine follow-up in any case the doxycycline I had given him was discontinued and started on linezolid. He has not started this. It is  easy to look at his left calf and the inflammation and think this is cellulitis however he is very tender in the tissue just below the popliteal fossa and I have no doubt that there is infection going on here. He states the problem he is having is that with the compression pumps the edema goes down and then starts walking the wrap falls down. We will see if we can adhere this. He has 1 or 2 minuscule open areas on the right still areas that are weeping on the posterior left calf, the base of his left second and third  toes 10/26/18; back today in clinic with quite of skin breakdown in his left anterior leg. This may have been infection the area below the popliteal fossa seems a lot better however tremendous epithelial loss on the left anterior mid tibia area over quite inexpensive tissue. He has 2 blisters on the right side but no other open wound here. 10/29/2018; came in urgently to see [place] today and we worked him in for review. He states that the 4 layer compression on the right leg caused pain he had to cut it down to roughly his mid calf this caused swelling above the wrap and he has blisters and skin breakdown today. As a result of the pain he has not been using his pumps. Both legs are a lot more edematous and there is a lot of weeping fluid. 11/02/18; arrives in clinic with continued difficulties in the right leg> left. Leg is swollen and painful. multiple skin blisters and new open areas especially laterally. He has not been using his pumps on the right leg. 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 He is not systemically unwell. 11/09/2018; the patient claims he is being compliant with his pumps. He is finished the doxycycline I gave him last week. Culture I did of the wound on the right lateral leg showed a few very resistant methicillin staph aureus. This was resistant to doxycycline. Nevertheless he states the pain in the leg is a lot better which makes me wonder if the cultured organism was not really what was causing the problem nevertheless this is a very dangerous organism to be culturing out of any wound. His right leg is still a lot larger than the left. He is using an Radio broadcast assistant on this area, he blames a 4-layer compression for causing the original skin breakdown which I doubt is true however I cannot talk him out of it. We have been using silver alginate to all of these areas which were initially blisters 11/16/2018; patient is being compliant with his  external compression pumps at twice a day. Miraculously he arrives in clinic today with absolutely no open wounds. He has better edema control on the left where he has been using 4 layer compression versus wound of wounds on the right and I pointed this out to him. There is no inflammation in the skin in his lower legs which is also somewhat unusual for him. There is no open wounds on the dorsal left foot. He has extremitease stockings at home and I have asked him to bring these in next week. 11/25/18 patient's lower extremity on examination today on the left appears for the most part to be wound free. He does have an open wound on the lateral aspect of the right lower extremity but this is minimal compared to what I've seen in past. He does request that we go ahead and  wrap the left leg as well even though there's nothing open just so hopefully it will not reopen in short order. 1/28; patient has superficial open wounds on the right lateral calf left anterior calf and left posterior calf. His edema control is adequate. 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 We have been using silver alginate as the primary dressing. He claims compliance with his compression pumps 2/4; patient has superficial open wounds on numerous areas of his left calf and again one on the left dorsal foot. The areas on the right lateral calf have healed. The cellulitis that I gave him doxycycline for last week is also resolved this was mostly on the left anterior calf just below the tibial tuberosity. His edema looks fairly well-controlled. He tells me he went to see his primary doctor today and had blood work ordered 2/11; once again he has several open areas on the left calf left tibial area. Most of these are small and appear to have healthy granulation. He does not have anything open on the right. The edema and control in his thighs is  pretty good which is usually a good indication he has been using his pumps as requested. 2/18; he continues to have several small areas on the left calf and left tibial area. Most of these are small healthy granulation. We put him in his stocking on the right leg last week and he arrives with a superficial open area over the right upper tibia and a fairly large area on the right lateral tibia in similar condition. His edema control actually does not look too bad, he claims to be using his compression pumps twice a day 2/25. Continued small areas on the left calf and left tibial area. New areas especially on the right are identified just below the tibial tuberosity and on the right upper tibia itself. There are also areas of weeping edema fluid even without an obvious wound. He does not have a considerable degree of lymphedema but clearly there is more edema here than his skin can handle. He states he is using the pumps twice a day. We have an Unna boot on the right and 4 layer compression on the left. 3/3; he continues to have an area on the right lateral calf and right posterior calf just below the popliteal fossa. There is a fair amount of tenderness around the wound on the popliteal fossa but I did not see any evidence of cellulitis, could just be that the wrap came down and rubbed in this area. He does not have an open area on the left leg however there is an area on the left dorsal foot at the base of the third toe We have been using silver alginate to all wound areas 3/10; he did not have an open area on his left leg last time he was here a week ago. T oday he arrives with a horizontal wound just below the tibial tuberosity and an area on the left lateral calf. He has intense erythema and tenderness in this area. The area is on the right lateral calf and right posterior calf better than last week. We have been using silver alginate as usual 3/18 - Patient returns with 3 small open areas on left  calf, and 1 small open area on right calf, the skin looks ok with no significant erythema, he continues the UNA boot on right and 4 layer  compression on left. The right lateral calf wound is closed , the right posterior is small area. we will continue silver alginate to the areas. 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 He is been tolerating the dressing changes without complication. Fortunately she has made excellent improvement in regard to the overall status of his wounds. Unfortunately every time we cease wrapping him he ends up reopening in causing more significant issues at that point. Again I'm unsure of the best direction to take although I think the lymphedema clinic may be appropriate for him. 02/03/19 on evaluation today patient appears to be doing well in regard to the wounds that we saw him for last week unfortunately he has a new area on the proximal portion of his right medial/posterior lower extremity where the wrap somewhat slowed down and caused swelling and a blister to rub and open. Unfortunately this is the only opening that he has on either leg at this point. 02/17/19 on evaluation today patient's bilateral lower extremities appear to be doing well. He still completely healed in regard to the left lower extremity. In regard to the right lower extremity the area where the wrap and slid down and caused the blister still seems to be slightly open although this is dramatically better than during the last evaluation two weeks ago. I'm very pleased with the way this stands overall. 03/03/19 on evaluation today patient appears to be doing well in regard to his right lower extremity in general although he did have a new blister open this does not appear to be showing any evidence of active  infection at this time. Fortunately there's No fevers, chills, nausea, or vomiting noted at this time. Overall I feel like he is making good progress it does feel like that the right leg will we perform the [employer] seems to do with a bit better than three layer wrap on the left which slid down on him. We may switch to doing bilateral in the book wraps. 5/4; I have not seen Mr. Sasso in quite some time. According to our case manager he did not have an open wound on his left leg last week. He had 1 remaining wound on the right posterior medial calf. He arrives today with multiple openings on the left leg probably were blisters and/or wrap injuries from Unna boots. I do not think the Unna boot's will provide adequate compression on the left. I am also not clear about the frequency he is using the compression pumps. 03/17/19 on evaluation today patient appears to be doing excellent in regard to his lower extremities compared to last week's evaluation apparently. He had gotten significantly worse last week which is unfortunate. The [employer] wrap on the left did not seem to do very well for him at all and in fact it didn't control his swelling significantly enough he had an additional outbreak. Subsequently we go back to the four layer compression wrap on the left. This is good news. At least in that he is doing better and the wound seem to be killing him. He still has not heard anything from the lymphedema clinic. 03/24/19 on evaluation today patient actually appears to be doing much better in regard to his bilateral lower [place] as compared to last week when I saw him. Fortunately there's no  signs of active infection at this time. He has been tolerating the dressing changes without complication. Overall I'm extremely pleased with the progress and appearance in general. 04/07/19 on evaluation today patient appears to be doing well in regard to his bilateral lower extremities. His swelling is significantly  down from where it was previous. With that being said he does have a couple blisters still open at this point but fortunately nothing that seems to be too severe and again the majority of the larger openings has healed at this time. 04/14/19 on evaluation today patient actually appears to be doing quite well in regard to his bilateral lower extremities in fact I'm not even sure there's anything significantly open at this time at any site. Nonetheless he did have some trouble with these wraps where they are somewhat irritating him secondary to the fact that he has noted that the graph wasn't too close down to the end of this foot in a little bit short as well up to his knee. Otherwise things seem to be doing quite well. 04/21/19 upon evaluation today patient's wound bed actually showed evidence of being completely healed in regard to both lower extremities which is excellent news. There does not appear to be any signs of active infection which is also good news. I'm very pleased in this regard. No fevers, chills, nausea, or vomiting noted at this time. 04/28/19 on evaluation today patient appears to be doing a little bit worse in regard to both lower extremities on the left mainly due to the fact that when he went infection disease the wrap was not wrapped quite high enough he developed a blister above this. On the right he is a small open area of nothing too significant but again this is continuing to give him some trouble he has been were in the Velcro compression that he has at home. 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 He's been tolerating the dressing changes without complication. Fortunately there's no signs of active infection at this time. No fevers, chills, nausea, or vomiting noted at this time. We have been trying to get an appointment with her lymphedema clinic in [facility] but unfortunately nobody can get  them on phone with not been able to even fax information over the patient likewise is not been able to get in touch with them. Overall I'm not sure exactly what's going on here with to reach out again today. 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 Still having a lot of drainage unfortunately. He tells me especially in the left but even on the right. There's no signs of active infection which is good news we've been using so ratcheted up to this point. 05/19/19 on evaluation today patient actually appears to be doing quite well with regard to his left lower extremity which is great news. Fortunately in regard to the right lower extremity has an issues with his wrap and he subsequently did remove this from what I'm understanding. Nonetheless long story short is what he had rewrapped once he removed it subsequently had maggots underneath this wrap whenever he came in for evaluation today. With that being said they were obviously completely cleaned away by the nursing staff. The visit today which is excellent news. However he does appear to potentially have some infection around the right ankle region where the maggots were located  as well. He will likely require anabiotic therapy today. 05/26/19 on evaluation today patient actually appears to be doing much better in regard to his bilateral lower extremities. I feel like the infection is under much better control. With that being said there were maggots noted when the wrap was removed yet again today. Again this could have potentially been left over from previous although at this time there does not appear to be any signs of significant drainage there was obviously on the wrap some drainage as well this contracted gnats or otherwise. Either way I do not see anything that appears to be doing worse in my pinion and in fact I think his drainage has slowed down quite  significantly likely mainly due to the fact to his infection being under better control. 06/02/2019 on evaluation today patient actually appears to be doing well with regard to his bilateral lower extremities there is no signs of active infection at this time which is great news. With that being said he does have several open areas more so on the right than the left but nonetheless these are all significantly better than previously noted. 06/09/2019 on evaluation today patient actually appears to be doing well. His wrap stayed up and he did not cause any problems he had more drainage on the right compared to the left but overall I do not see any major issues at this time which is great news. 06/16/2019 on evaluation today patient appears to be doing excellent with regard to his lower extremities the only area that is open is a new blister that can have opened as of today on the medial ankle on the left. Other than this he really seems to be doing great I see no major issues at this point. 06/23/2019 on evaluation today patient appears to be doing quite well with regard to his bilateral lower extremities. In fact he actually appears to be almost completely healed there is a small area of weeping noted of the right lower extremity just above the ankle. Nonetheless fortunately there is no signs of active infection at this time which is good news. No fevers, chills, nausea, vomiting, or diarrhea. 8/24; the patient arrived for a nurse visit today but complained of very significant pain in the left leg and therefore I was asked to look at this. Noted that he did not have an open area on the left leg last week nevertheless this was wrapped. The patient states that he is not been able to put his compression pumps on the left leg because of the discomfort. He has not been systemically unwell 06/30/2019 on evaluation today patient unfortunately despite being excellent last week is doing much worse with regard to his  left lower extremity today. In fact he had to come in for a nurse on Monday where his left leg had to be rewrapped due to excessive weeping Dr. Leanord Hawking placed him on doxycycline at that point. Fortunately there is no signs of active infection Systemically at this time which is good news. 07/07/2019 in regard to the patient's wounds today he actually seems to be doing well with his right lower extremity there really is nothing open or draining at this point this is great news. Unfortunately the left lower extremity is given him additional trouble at this time. There does not appear to be any signs of active infection nonetheless he does have a lot of edema and swelling noted at this point as well as blistering all of which has led  to a much more poor appearing leg at this time compared to where it was 2 weeks ago when it was almost completely healed. Obviously this is a little discouraging for the patient. He is try to contact the lymphedema clinic in [place] he has not been able to get through to them. 07/14/2019 on evaluation today patient actually appears to be doing slightly better with regard to his left lower extremity ulcers. Overall I do feel like at least at the top of the wrap that we have been placing this area has healed quite nicely and looks much better. The remainder of the leg is showing signs of improvement. Unfortunately in the thigh area he still has an open region on the left and again on the right he has been utilizing just a Band-Aid on an area that also opened on the thigh. Again this is an area that were not able to wrap although we did do an Ace wrap to provide some compression that something that obviously is a little less effective than the compression wraps we have been using on the lower portion of the leg. He does have an appointment with the lymphedema clinic in [facility] on Friday. 07/21/2019 on evaluation today patient appears to be doing better with regard to his  lower extremity ulcers. He has been tolerating the dressing changes without complication. Fortunately there is no signs of active infection at this time. No fevers, chills, nausea, vomiting, or diarrhea. I did receive the paperwork from the physical therapist at the lymphedema clinic in [place]. Subsequently I signed off on that this morning and sent that back to him for further progression with the treatment plan. 07/28/2019 on evaluation today patient appears to be doing very well with regard to his right lower extremity where I do not see any open wounds at this point. Fortunately he is feeling great as far as that is concerned as well. In regard to the left lower extremity he has been having issues with still several areas of weeping and edema although the upper leg is doing better his lower leg still I think is going require the compression wrap at this time. No fevers, chills, nausea, vomiting, or diarrhea. 08/04/2019 on evaluation today patient unfortunately is having new wounds on the right lower extremity. Again we have been using Unna boot wrap on that side. We switched him to using his juxta lite wrap at home. With that being said he tells me he has been using it although his legs extremely swollen and to be honest really does not appear that he has been. I cannot know that for sure however. Nonetheless he has multiple new wounds on the right lower extremity at this time. Obviously we will have to see about getting this rewrapped for him today. 08/11/2019 on evaluation today patient appears to be doing fairly well with regard to his wounds. He has been tolerating the dressing changes including the compression wraps without complication. He still has a lot of edema in his upper thigh regions bilaterally he is supposed to be seeing the lymphedema clinic on the 15th of this month once his wraps arrive for the upper part of his legs. 08/18/2019 on evaluation today patient appears to be doing  well with regard to his bilateral lower extremities at this point. He has been tolerating the dressing changes without complication. Fortunately there is no signs of active infection which is also good news. He does have a couple weeping areas on the first and second  toe of the right foot he also has just a small area on the left foot upper leg and a small area on the left lower leg but overall he is doing quite well in my opinion. He is supposed to be getting his wraps shortly in fact tomorrow and then subsequently is seeing the lymphedema clinic next Wednesday on the 21st. Of note he is also leaving on the 25th to go on vacation for a week to the beach. For that reason and since there is some uncertainty about what there can be doing at lymphedema clinic next Wednesday I am get a make an appointment for next Friday here for [place] to see what we need to do for him prior to him leaving for vacation. 10/23; patient arrives in considerable pain predominantly in the upper posterior calf just distal to the popliteal fossa also in the wound anteriorly above the major wound. This is probably cellulitis and he has had this recurrently in the past. He has no open wound on the right side and he has had an Radio broadcast assistant in that area. Finally I note that he has an area on the left posterior calf which by enlarge is mostly epithelialized. This protrudes beyond the borders of the surrounding skin in the setting of dry scaly skin and lymphedema. The patient is leaving for [facility] on Sunday. Per his longstanding pattern, he will not take his compression pumps with him predominantly out of fear that they will be stolen. He therefore asked that we put a Unna boot back on the right leg. He will also contact the wound care center in [facility] to see if they can change his dressing in the mid week. 11/3; patient returned from his vacation to [facility]. He was seen on 1 occasion at their wound care center. They did a 2  layer compression system as they did not have our 4-layer wrap. I am not completely certain what they put on the wounds. They did not change the Unna boot on the right. The patient is also seeing a lymphedema specialist physical therapist in [place]. It appears that he has some compression sleeve for his thighs which indeed look quite a bit better than I am used to seeing. He pumps over these with his external compression pumps. 11/10; the patient has a new wound on the right medial thigh otherwise there is no open areas on the right. He has an area on the left leg posteriorly anteriorly and medially and an area over the left second toe. We have been using silver alginate. He thinks the injury on his thigh is secondary to friction from the compression sleeve he has. 11/17; the patient has a new wound on the right medial thigh last week. He thinks this is because he did not have a underlying stocking for his thigh juxta lite apparatus. He now has this. The area is fairly large and somewhat angry but I do not think he has underlying cellulitis. He has a intact blister on the right anterior tibial area. Small wound on the right great toe dorsally Small area on the medial left calf. 11/30; the patient does not have any open areas on his right leg and we did not take his juxta lite stocking off. However he states that on Friday his compression wrap fell down lodging around his upper mid calf area. As usual this creates a lot of problems for him. He called urgently today to be seen for a nurse visit  however the nurse visit turned into a provider visit because of extreme erythema and pain in the left anterior tibia extending laterally and posteriorly. The area that is problematic is extensive 10/06/2019 upon evaluation today patient actually appears to be doing poorly in regard to his left lower extremity. He Dr. Leanord Hawking did place him on doxycycline this past Monday apparently due to the fact that he  was doing much worse in regard to this left leg. Fortunately the doxycycline does seem to be helping. Unfortunately we are still having a very difficult time getting his edema under any type of control in order to anticipate discharge at some point. The only way were really able to control his lymphedema really is with compression wraps and that has only even seemingly temporary. He has been seeing a lymphedema clinic they are trying to help in this regard but still this has been somewhat frustrating in general for the patient. 10/13/19 on evaluation today patient appears to be doing excellent with regard to his right lower extremity as far as the wounds are concerned. His swelling is still quite extensive unfortunately. He is still having a lot of drainage from the thigh areas bilaterally which is unfortunate. He's been going to lymphedema clinic but again he still really does not have this edema under control as far as his lower extremities are concern. With regard to his left lower extremity this seems to be improving and I do believe the doxycycline has been of benefit for him. He is about to complete the doxycycline. 10/20/2019 on evaluation today patient appears to be doing poorly in regard to his bilateral lower extremities. More in the right thigh he has a lot of irritation at this site unfortunately. In regard to the left lower extremity the wrap was not quite as high it appears and does seem to have caused him some trouble as well. Fortunately there is no evidence of systemic infection though he does have some blue-green drainage which has me concerned for the possibility of Pseudomonas. He tells me he is previously taking Cipro without complications and he really does not care for Levaquin however due to some of the side effects he has. He is not allergic to any medications specifically antibiotics that were aware of. 10/27/2019 on evaluation today patient actually does appear to be for the  most part doing better when compared to last week's evaluation. With that being said he still has multiple open wounds over the bilateral lower extremities. He actually forgot to start taking the Cipro and states that he still has the whole bottle. He does have several new blisters on left lower extremity today I think I would recommend he go ahead and take the Cipro based on what I am seeing at this point. 12/30-Patient comes at 1 week visit, 4 layer compression wraps on the left and Unna boot on the right, primary dressing Xtrasorb and silver alginate. Patient is taking his Cipro and has a few more days left probably 5-6, and the legs are doing better. He states he is using his compressions devices which I believe he has 11/10/2019 on evaluation today patient actually appears to be much better than last time I saw him 2 weeks ago. His wounds are significantly improved and overall I am very pleased in this regard. Fortunately there is no signs of active infection at this time. He is just a couple of days away from completing Cipro. Overall his edema is much better he has been using his  lymphedema pumps which I think is also helping at this point. 11/17/2019 on evaluation today patient appears to be doing excellent in regard to his wounds in general. His legs are swollen but not nearly as much as they have been in the past. Fortunately he is tolerating the compression wraps without complication. No fevers, chills, nausea, vomiting, or diarrhea. He does have some erythema however in the distal portion of his right lower extremity specifically around the forefoot and toes there is a little bit of warmth here as well. 11/24/2019 on evaluation today patient appears to be doing well with regard to his right lower extremity I really do not see any open wounds at this point. His left lower extremity does have several open areas and his right medial thigh also is open. Other than this however overall the patient  seems to be making good progress and I am very pleased at this point. 12/01/2019 on evaluation today patient appears to be doing poorly at this point in regard to his left lower extremity has several new blisters despite the fact that we have him in compression wraps. In fact he had a 4-layer compression wrap, his upper thigh wrapped from lymphedema clinic, and a juxta light over top of the 4 layer compression wrap the lymphedema clinic applied and despite all this he still develop blisters underneath. Obviously this does have me concerned about the fact that unfortunately despite what we are doing to try to get wounds healed he continues to have new areas arise I do not think he is ever good to be at the point where he can realistically just use wraps at home to keep things under control. Typically when we heal him it takes about 1-2 days before he is back in the clinic with severe breakdown and blistering of his lower extremities bilaterally. This is happened numerous times in the past. Unfortunately I think that we may need some help as far as overall fluid overload to kind of limit what we are seeing and get things under better control. 12/08/2019 on evaluation today patient presents for follow-up concerning his ongoing bilateral lower extremity edema. Unfortunately he is still having quite a bit of swelling the compression wraps are controlling this to some degree but he did see Dr. [name] his cardiologist I do have that available for review today as far as the appointment was concerned that was on 12/06/2019. Obviously that she has been 2 days ago. The patient states that he is only been taking the Lasix 80 mg 1 time a day he had told me previously he was taking this twice a day. Nonetheless Dr. [name] recommended this be up to 80 mg 2 times a day for the patient as he did appear to be fluid overloaded. With that being said the patient states he did this yesterday and he was unable to go anywhere or do  anything due to the fact that he was constantly having to urinate. Nonetheless I think that this is still good to be something that is important for him as far as trying to get his edema under control at all things that he is going to be able to just expect his wounds to get under control and things to be better without going through at least a period of time where he is trying to stabilize his fluid management in general and I think increasing the Lasix is likely the first step here. It was also mentioned the possibility that the patient may  require metolazone. With that being said he wanted to have the patient take Lasix twice a day first and then reevaluating 2 months to see where things stand. 12/15/2019 upon evaluation today patient appears to be doing regard to his legs although his toes are showing some signs of weeping especially on the left at this point to some degree on the right. There does not appear to be any signs of active infection and overall I do feel like the compression wraps are doing well for him but he has not been able to take the Lasix at home and the increased dose that Dr. [name] recommended. He tells me that just not go to be feasible for him. Nonetheless I think in this case he should probably send a message to Dr. [name] in order to discuss options from the standpoint of possible admission to get the fluid off or otherwise going forward. 12/22/2019 upon evaluation today patient appears to be doing fairly well with regard to his lower extremities at this point. In fact he would be doing excellent if it was not for the fact that his right anterior thigh apparently had an allergic reaction to adhesive tape that he used. The wound itself that we have been monitoring actually appears to be healed. There is a lot of irritation at this point. 12/29/2019 upon evaluation today patient appears to be doing well in regard to his lower extremities. His left medial thigh is open and  somewhat draining today but this is the only region that is open the right has done much better with the treatment utilizing the steroid cream that I prescribed for him last week. Overall I am pleased in that regard. Fortunately there is no signs of active infection at this time. No fevers, chills, nausea, vomiting, or diarrhea. 01/05/2020 upon evaluation today patient appears to be doing more poorly in regard to his right lower extremity at this point upon evaluation today. Unfortunately he continues to have issues in this regard and I think the biggest issue is controlling his edema. This obviously is not very well controlled at this point is been recommended that he use the Lasix twice a day but he has not been able to do that. Unfortunately I think this is leading to an issue where honestly he is not really able to effectively control his edema and therefore the wounds really are not doing significantly better. I do not think that he is going to be able to keep things under good control unless he is able to control his edema much better. I discussed this again in great detail with him today. 01/12/2020 good news is patient actually appears to be doing quite well today at this point. He does have an appointment with lymphedema clinic tomorrow. His legs appear healed and the toe on the left is almost completely healed. In general I am very pleased with how things stand at this point. 01/19/2020 upon evaluation today patient appears to actually be doing well in regard to his lower extremities there is nothing open at this point. Fortunately he has done extremely well more recently. Has been seeing lymphedema clinic as well. With that being said he has Velcro wraps for his lower legs as well as his upper legs. The only wound really is on his toe which is the right great toe and this is barely anything even there. With all that being said I think it is good to be appropriate today to go ahead and switch him  over to the Velcro compression wraps. 01/26/2020 upon evaluation today patient appears to be doing worse with regard to his lower extremities after last week switch him to Velcro compression wraps. Unfortunately he lasted less than 24 hours he did not have the sock portion of his Velcro wrap on the left leg and subsequently developed a blister underneath the Velcro portion. Obviously this is not good and not what we were looking for at this point. He states the lymphedema clinic did tell him to wear the wrap for 23 hours and take him off for 1 I am okay with that plan but again right now we got a get things back under control again he may have some cellulitis noted as well. 02/02/2020 upon evaluation today patient unfortunately appears to have several areas of blistering on his bilateral lower extremities today mainly on the feet. His legs do seem to be doing somewhat better which is good news. Fortunately there is no evidence of active infection at this time. No fevers, chills, nausea, vomiting, or diarrhea. 02/16/2020 upon evaluation today patient appears to be doing well at this time with regard to his legs. He has a couple weeping areas on his toes but for the most part everything is doing better and does appear to be sealed up on his legs which is excellent news. We can continue with wrapping him at this point as he had every time we discontinue the wraps he just breaks out with new wounds. There is really no point in is going forward with this at this point. 03/08/2020 upon evaluation today patient actually appears to be doing quite well with regard to his lower extremity ulcers. He has just a very superficial and really almost nonexistent blister on the left lower extremity he has in general done very well with the compression wraps. With that being said I do not see any signs of infection at this time which is good news. 03/29/2020 upon evaluation today patient appears to be doing well with regard  to his wounds currently except for where he had several new areas that opened up due to some of the wrap slipping and causing him trouble. He states he did not realize they had slipped. Nonetheless he has a 1 area on the right and 3 new areas on the left. Fortunately there is no signs of active infection at this time which is great news. 04/05/2020 upon evaluation today patient actually appears to be doing quite well in general in regard to his legs currently. Fortunately there is no signs of active infection at this time. No fevers, chills, nausea, vomiting, or diarrhea. He tells me next week that he will actually be seen in the lymphedema clinic on Thursday at 10 AM I see him on Wednesday next week. 04/12/2020 upon evaluation today patient appears to be doing very well with regard to his lower extremities bilaterally. In fact he does not appear to have any open wounds at this point which is good news. Fortunately there is no signs of active infection at this time. No fevers, chills, nausea, vomiting, or diarrhea. 04/19/2020 upon evaluation today patient appears to be doing well with regard to his wounds currently on the bilateral lower extremities. There does not appear to be any signs of active infection at this time. Fortunately there is no evidence of systemic infection and overall very pleased at this point. Nonetheless after I held him out last week he literally had blisters the next morning already which swelled up  with him being right back here in the clinic. Overall I think that he is just not can be able to be discharged with his legs the way they are he is much to volume overloaded as far as fluid is concerned and that was discussed with him today of also discussed this but should try the clinic nurse manager as well as Dr. Leanord Hawking. 04/26/2020 upon evaluation today patient appears to be doing better with regard to his wounds currently. He is making some progress and overall swelling is under good  control with the compression wraps. Fortunately there is no evidence of active infection at this time. 05/10/2020 on evaluation today patient appears to be doing overall well in regard to his lower extremities bilaterally. He is Tolerating the compression wraps without complication and with what we are seeing currently I feel like that he is making excellent progress. There is no signs of active infection at this time. 05/24/2020 upon evaluation today patient appears to be doing well in regard to his legs. The swelling is actually quite a bit down compared to where it has been in the past. Fortunately there is no sign of active infection at this time which is also good news. With that being said he does have several wounds on his toes that have opened up at this point. 05/31/2020 upon evaluation today patient appears to be doing well with regard to his legs bilaterally where he really has no significant fluid buildup at this point overall he seems to be doing quite well. Very pleased in this regard. With regard to his toes these also seem to be drying up which is excellent. We have continue to wrap him as every time we tried as a transition to the juxta light wraps things just do not seem to get any better. 06/07/2020 upon evaluation today patient appears to be doing well with regard to his right leg at this point. Unfortunately left leg has a lot of blistering he tells me the wrap started to slide down on him when he tried to put his other Velcro wrap over top of it to help keep things in order but nonetheless still had some issues. 06/14/2020 on evaluation today patient appears to be doing well with regard to his lower extremity ulcers and foot ulcers at this point. I feel like everything is actually showing signs of improvement which is great news overall there is no signs of active infection at this time. No fevers, chills, nausea, vomiting, or diarrhea. 06/21/2020 on evaluation today patient actually  appears to be doing okay in regard to his wounds in general. With that being said the biggest issue I see is on his right foot in particular the first and second toe seem to be doing a little worse due to the fact this is staying very wet. I think he is probably getting need to change out his dressings a couple times in between each week when we see him in regard to his toes in order to keep this drier based on the location and how this is proceeding. 06/28/2020 on evaluation today patient appears to be doing a little bit more poorly overall in regard to the appearance of the skin I am actually somewhat concerned about the possibility of him having a little bit of an infection here. We discussed the course of potentially giving him a doxycycline prescription which he is taken previously with good result. With that being said I do believe that this is potentially mild  and at this point easily fixed. I just do not want anything to get any worse. 07/12/2020 upon evaluation today patient actually appears to be making some progress with regard to his legs which is great news there does not appear to be any evidence of active infection. Overall very pleased with where things stand. 07/26/2020 upon evaluation today patient appears to be doing well with regard to his leg ulcers and toe ulcers at this point. He has been tolerating the compression wraps without complication overall very pleased in this regard. 08/09/2020 upon evaluation today patient appears to be doing well with regard to his lower extremities bilaterally. Fortunately there is no signs of active infection overall I am pleased with where things stand. 08/23/2020 on evaluation today patient appears to be doing well with regard to his wound. He has been tolerating the dressing changes without complication. Fortunately there is no signs of active infection at this time. Overall his legs seem to be doing quite well which is great news and I am very  pleased in that regard. No fevers, chills, nausea, vomiting, or diarrhea. 09/13/2020 upon evaluation today patient appears to be doing okay in regard to his lower extremities. He does have a fairly large blister on the right leg which I did remove the blister tissue from today so we can get this to dry out other than that however he seems to be doing quite well. There is no signs of active infection at this time. 09/27/2020 upon evaluation today patient appears to actually be doing some better in regard to his right leg. Fortunately signs of active infection at this time which is great news. No fevers, chills, nausea, vomiting, or diarrhea. 10/04/2020 upon evaluation today patient actually appears to be showing signs of improvement which is great news with regard to his leg ulcers. Fortunately there is no signs of active infection which is great news he is still taking the antibiotics currently. No fevers, chills, nausea, vomiting, or diarrhea. 10/18/2020 on evaluation today patient appears to be doing well with regard to his legs currently. He has been tolerating the dressing changes including the wraps without complication. Fortunately there is no signs of active infection at this time. No fevers, chills, nausea, vomiting, or diarrhea. 10/25/2020 upon evaluation today patient appears to be doing decently well in regard to his wounds currently. He has been tolerating the dressing changes without complication. Overall I feel like he is making good progress albeit slow. Again this is something we can have to continue to wrap for some time to come most likely. 11/08/2020 upon evaluation today patient appears to be doing well with regard to his wounds currently. He has been tolerating the dressing changes without complication is not currently on any antibiotics and he does not appear to show any signs of infection. He does continue to have a lot of drainage on the right leg not too severe but nonetheless  this is very scattered. On the left leg this is looking to be much improved overall. 11/15/2020 upon evaluation today patient appears to be doing better with regard to his legs bilaterally. Especially the right leg which was much more significant last week. There does not appear to be any signs of active infection which is great news. No fevers, chills, nausea, vomiting, or diarrhea. 11/23/2019 upon evaluation today patient appears to be doing poorly still in regard to his lower extremities bilaterally. Unfortunately his right leg in particular appears to be doing much more poorly there is  no signs really of infection this is not warm to touch but he does have a lot of drainage and weeping unfortunately. With that reason I do believe that we may need to initiate some treatment here to try to help calm down some of the swelling of the right leg. I think switching to a 4-layer compression wrap would be beneficial here. The patient is in agreement with giving this a try. 11/29/2020 upon evaluation today patient appears to be doing well currently in regard to his leg ulcers. I feel like the right leg is doing better he still has a lot of drainage but we do see some improvement here. The 4-layer compression wrap I think was helpful. 12/06/2020 upon evaluation today patient appears to be doing well with regard to his legs. In fact they seem to be doing about the best I have seen up to this point. Fortunately there is no signs of active infection at this time. No fevers, chills, nausea, vomiting, or diarrhea. 12/20/2020 upon evaluation today patient appears to be doing well at this time in regard to his legs. He is not having any significant draining which is great news. Fortunately there is no signs of active infection at this time. No fevers, chills, nausea, vomiting, or diarrhea. 01/17/2021 upon evaluation today Johnavon actually appears to be doing excellent in regard to his legs. He has a few areas again that come  and go as far as his toes are concerned but overall this is doing quite well. 01/31/2021 upon evaluation today patient appears to be doing well with regard to his legs. Fortunately there does not appear to be any signs of active infection which is great news. Overall he is still having significant edema despite the compression wraps basically the 4-layer compression wrap to just keep things under control there is really not much room for play. 4/13: Mr. Mayotte is a longstanding patient in our clinic and benefits greatly from weekly compression wraps. Today he has no complaints. He has been tolerating the wraps well. He states he is using the lymphedema pumps at home. 5/4; patient presents for follow-up of his chronic lymphedema/venous insufficiency ulcers. He comes weekly for compression wraps. He has no complaints today. He was unable to tolerate the Coflex 2 layer Last week so we will do the four press 4-layer compression. He has been using his lymphedema pumps daily. 5/18; patient presents for 2-week follow-up. He has no complaints or issues today. He has developed a new wound to the right foot on his fourth toe. He overall feels well and denies signs of infection. 6/1; patient presents for 2-week follow-up. He has no complaints or issues today. He denies signs of infection. 04/18/2021 upon evaluation today patient appears to be doing well with regard to his legs bilaterally. Family open wound is actually on the toe of his left foot everything else is completely closed which is great news. In general I am extremely pleased with where things stand at this point. The patient is also happy that things are doing so well. 05/02/2021 upon evaluation today patient's legs actually appear to be doing quite well today. Fortunately there does not appear to be any signs of active infection which is great and overall I am extremely pleased with where he stands today. The patient does not appear to have any  evidence of active infection at this time which is also great news. 05/09/2021 upon evaluation today patient appears to be doing a little bit more poorly in  regard to his legs. Unfortunately he is having issues with some breakdown and a blood blister on the left leg this is due to I believe honestly to how it was wrapped last week. Fortunately there does not appear to be any signs of infection but nonetheless this is still a concern to be honest. No fevers, chills, nausea, vomiting, or diarrhea. 05/16/2021 upon evaluation today patient appears to be doing significantly better as compared to last week. I am very pleased with where things stand today. There does not appear to be any signs of infection which is great news and overall very pleased with where we stand. No fevers, chills, nausea, vomiting, or diarrhea. 05/30/2021 upon evaluation today patient appears to be doing well with regard to his legs. He has been tolerating the dressing changes without complication. Fortunately there does not appear to be any signs of active infection which is great news and overall I am extremely pleased with where things stand today. No fevers, chills, nausea, vomiting, or diarrhea. 06/20/2021 upon evaluation today patient actually appears to be making good progress today and very pleased with what we are seeing. I think his legs are really maintaining. As long as we continue wrapping he seems to be doing excellent in my opinion. Fortunately there is no signs of active infection at this time. No fevers, chills, nausea, vomiting, or diarrhea. 07/11/2021 upon evaluation today patient actually appears to be making excellent progress at this time. Fortunately there does not appear to be any evidence of active infection which is great news and overall I am extremely pleased with where things stand today. No fevers, chills, nausea, vomiting, or diarrhea. 07/25/2021 upon evaluation today patient appears to be doing well  currently in regard to his lower extremities. He has been making good progress here and I do not see anything that is actually open significantly today this is great news. No fevers, chills, nausea, vomiting, or diarrhea. 08/08/2021 upon evaluation today patient appears to be doing well with regard to his wound. He has been tolerating the dressing changes without complication. With that being said unfortunately has a new area that opened up as far as his right posterior leg is concerned this was a blister he also has an area on the third toe right foot which also reopen. Fortunately there is no signs of active infection at this time which is great news. No fevers, chills, nausea, vomiting, or diarrhea. 10/17; patient came in today at his request initially for a nurse visit because it but out of concern for deterioration in both his lower legs and cellulitis I was asked to look at him. He comes in with increased swelling which he says started over the weekend he started to notice pain as well in his left medial ankle, right knee, left knee left dorsal foot. His wraps fell down contributing to some of this but he has not been using his compression pumps over the weekend for reasons that are not really clear. He comes in with multiple new wounds including the right posterior leg, right third toe, right fourth toe, left second toe left medial ankle left dorsal ankle and right anterior lower leg 09/19/2021 upon evaluation today patient does seem to be making improvements in general which is great news. I do not see any evidence of infection currently he does have some hypergranulation of the anterior portion of his ankle on the left side this is going require some debridement to pare this down and then subsequently silver  nitrate probably due to the amount of bleeding that he is probably going to experience. He is in agreement with this plan however. 09/26/2021 upon evaluation today although the patient's  legs appear to be doing okay today unfortunately he did have maggots noted during the evaluation as well. This is again quite unfortunate with the respect to the fact that he feels like that he got the wrap wet which was noted today he is not sure when and I think this is what led to the issue. Nonetheless we can try to see what we can do about silly things off so that this does not happen again in the future although he is can have to be diligent about taking care of his wraps as well. 10/03/2021 upon evaluation today patient appears to be doing okay in regard to his legs currently. He unfortunately has maggots again noted on the left foot. This obviously is becoming quite an issue to be perfectly honest. I do think that he needs to see what can be done at home to try to limit this exposure. Last week we had cleaned everything away so this is a new infestation as far as that is concerned. There is really not much that I can do to combat that I am trying to do what we can here in the clinic to wrap his foot and try to prevent any access of that again with knots there is a small this becomes very difficult. 10/10/2021 upon evaluation today patient appears to be doing poorly in regard to his legs he is very swollen and unfortunately I think a big part of the issue here is simply that he is not taking his fluid pills appropriately stressed probably is not helping much at all either. He still having a lot of issues as far as getting moved and having to be out of his current living condition. With that being said he does have of note maggots noted on the right foot this time completely separate from what we have been seeing he tells me that he got his wraps wet again. Its mainly when it rains it sounds like that he goes out to his car in the area where he has to park there is a area that collects a fairly large puddle according to what he tells me today either way I explained to him which we have done before  as well that if he gets his wraps wet he needs to let us know so we can get them changed out. He does not need to keep them on wet. 10/17/2021 upon evaluation today patient appears to be doing well in regard to his legs. In fact things are significantly better compared to where they were previous. Fortunately I see no evidence of infection currently which is great news. No fevers, chills, nausea, vomiting, or diarrhea. 10/24/2021 upon evaluation today patient appears to be doing awesome in regard to his legs in general. I think everything is showing signs of significant improvement which is great news. There is really only 1 opening on the second toe left foot everything else is closed. 11/07/2020 upon evaluation today patient appears to be doing well with regard to his legs in general he does have a blister on the left foot but otherwise things are doing overall quite well. 11/28/2021 upon evaluation today patient's legs appear to be doing decently well. The biggest issue we see is that he does have some blistering where the wrap seems to have slid down  on the right leg near the top. This is just below the knee. That is good to be the biggest issue going forward at this point in my opinion. Otherwise I really feel like he is doing pretty well across the board with the other wound locations previously noted. 12/05/2021 upon evaluation today patient appears to be doing well with regard to the majority of his wounds in fact he has 1 area healed. Unfortunately he has a new area that broken down over the anterior portion of his left ankle. This is quite deep and draining quite a bit. Fortunately I do not see any signs of infection here though on the lateral portion of his ankle there is some erythema and warmth to touch no drainage nothing really to culture but I am concerned about cellulitis starting up here. 12/12/2021 upon evaluation today patient appears to be doing significantly better in regard to his leg  ulcers. Everything is improved as compared to the last time I saw him. I am actually very pleased with where we stand today. The patient is not having any significant pain which is great news. Electronic Signature(s) Signed: 12/12/2021 1:00:15 PM By: Lenda KelpStone III, Chiyoko Torrico PA-C Entered By: Lenda KelpStone III, Maudine Kluesner on 12/12/2021 13:00:14 -------------------------------------------------------------------------------- Physical Exam Details Patient Name: Date of Service: [name], [name] 12/12/2021 10:30 A M Medical Record Number: [id number] Patient Account Number: [id number] Date of Birth/Sex: Treating RN: 07/17/1951 (71 y.o. Tammy SoursM) [name], [name] Primary Care Provider: Nicoletta BaMcGowen, [name] Other Clinician: Referring Provider: Treating Provider/Extender: Adele DanStone III, Caswell Alvillar McGowen, [name] in Treatment: 307 Constitutional Obese and well-hydrated in no acute distress. Respiratory normal breathing without difficulty. Psychiatric this patient is able to make decisions and demonstrates good insight into disease process. Alert and Oriented x 3. pleasant and cooperative. Notes Upon inspection patient's wound bed showed signs of good granulation and epithelization at this point. Fortunately I do not see any evidence of active infection locally or systemically which is great news and overall I think that the patient is making excellent progress here. Postdebridement I did clear away some of the necrotic debris over the surface of the wound on the left anterior ankle region. He tolerated that without complication postdebridement wound bed is significantly improved. Electronic Signature(s) Signed: 12/12/2021 1:00:50 PM By: Lenda KelpStone III, Albertina Leise PA-C Entered By: Lenda KelpStone III, Fishel Wamble on 12/12/2021 13:00:50 -------------------------------------------------------------------------------- Physician Orders Details Patient Name: Date of Service: [name], [name] 12/12/2021 10:30 A M Medical Record Number: [id number] Patient Account Number:  [id number] Date of Birth/Sex: Treating RN: 03/25/1951 (71 y.o. Harlon FlorM) [name], [name] Primary Care Provider: Nicoletta BaMcGowen, [name] Other Clinician: Referring Provider: Treating Provider/Extender: Adele DanStone III, Charnise Lovan McGowen, [name] in Treatment: [phone] Verbal / Phone Orders: No Diagnosis Coding ICD-10 Coding Code Description L97.521 Non-pressure chronic ulcer of other part of left foot limited to breakdown of skin L97.829 Non-pressure chronic ulcer of other part of left lower leg with unspecified severity L97.519 Non-pressure chronic ulcer of other part of right foot with unspecified severity L97.812 Non-pressure chronic ulcer of other part of right lower leg with fat layer exposed I87.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 - Wednesday Algis LimingHoyt and [name], RN Room 1 Return A Bathing/ Shower/ Hygiene May shower with protection but do not  get wound dressing(s) wet. Edema Control - Lymphedema / SCD / Other Bilateral Lower Extremities Lymphedema Pumps. Use Lymphedema pumps on leg(s) 2-3 times a day for 45-60 minutes. If wearing any wraps or hose, do not remove them. Continue exercising as instructed. Elevate legs to the level of the heart or above for 30 minutes daily and/or when sitting, a frequency of: - throughout the day Avoid standing for long periods of time. Exercise regularly Other Edema Control Orders/Instructions: - right leg 4 layer compression with unna boot to secure to upper portion of lower leg. apply lotion to right leg. Wound Treatment Wound #204 - T Second oe Wound Laterality: Left Peri-Wound Care: Sween Lotion (Moisturizing lotion) 3 x  Per Day/30 Days Discharge Instructions: Apply moisturizing lotion as directed Prim Dressing: KerraCel Ag Gelling Fiber Dressing, 2x2 in (silver alginate) 3 x Per Day/30 Days ary Discharge Instructions: Apply silver alginate to wound bed as instructed Secondary Dressing: Woven Gauze Sponge, Non-Sterile 4x4 in 3 x Per Day/30 Days Discharge Instructions: Apply over primary dressing as directed. Secured With: Coban Self-Adherent Wrap 4x5 (in/yd) 3 x Per Day/30 Days Discharge Instructions: Secure with Coban as directed. Secured With: Insurance underwriter, Sterile 2x75 (in/in) 3 x Per Day/30 Days Discharge Instructions: Secure with stretch gauze as directed. Wound #211 - Ankle Wound Laterality: Left, Anterior Peri-Wound Care: Sween Lotion (Moisturizing lotion) 3 x Per Day/30 Days Discharge Instructions: Apply moisturizing lotion as directed Prim Dressing: KerraCel Ag Gelling Fiber Dressing, 2x2 in (silver alginate) 3 x Per Day/30 Days ary Discharge Instructions: Apply silver alginate to wound bed as instructed Secondary Dressing: Woven Gauze Sponge, Non-Sterile 4x4 in 3 x Per Day/30 Days Discharge Instructions: Apply over primary dressing as directed. Secondary Dressing: ABD Pad, 8x10 3 x Per Day/30 Days Discharge Instructions: Apply over primary dressing as directed. Compression Wrap: FourPress (4 layer compression wrap) 3 x Per Day/30 Days Discharge Instructions: Apply four layer compression . unna layer at top to secure wrap Wound #212 - Lower Leg Wound Laterality: Right, Lateral Peri-Wound Care: Sween Lotion (Moisturizing lotion) 3 x Per Day/30 Days Discharge Instructions: Apply moisturizing lotion as directed Prim Dressing: KerraCel Ag Gelling Fiber Dressing, 2x2 in (silver alginate) 3 x Per Day/30 Days ary Discharge Instructions: Apply silver alginate to wound bed as instructed Secondary Dressing: Woven Gauze Sponge, Non-Sterile 4x4 in 3 x Per Day/30 Days Discharge  Instructions: Apply over primary dressing as directed. Secondary Dressing: ABD Pad, 8x10 3 x Per Day/30 Days Discharge Instructions: Apply over primary dressing as directed. Compression Wrap: FourPress (4 layer compression wrap) 3 x Per Day/30 Days Discharge Instructions: Apply four layer compression . unna layer at top to secure wrap Electronic Signature(s) Signed: 12/12/2021 5:11:08 PM By: Lenda Kelp PA-C Signed: 12/12/2021 5:26:44 PM By: Shawn Stall RN, BSN Signed: 12/12/2021 5:26:44 PM By: Shawn Stall RN, BSN Entered By: Shawn Stall on 12/12/2021 11:20:07 -------------------------------------------------------------------------------- Problem List Details Patient Name: Date of Service: [name], [name] 12/12/2021 10:30 A M Medical Record Number: [id number] Patient Account Number: [id number] Date of Birth/Sex: Treating RN: 1951/10/23 (71 y.o. 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 04/27/2018 No Yes skin L97.829 Non-pressure chronic ulcer of other part of left lower leg with unspecified 03/21/2021 No Yes severity L97.519 Non-pressure chronic ulcer  of other part of right foot with unspecified severity 03/21/2021 No Yes L97.812 Non-pressure chronic ulcer of other part of right lower leg with fat layer 08/08/2021 No Yes exposed I87.333 Chronic venous hypertension (idiopathic) with ulcer and inflammation of 01/22/2016 No Yes bilateral lower extremity L03.116 Cellulitis of left lower limb 08/20/2021 No Yes I89.0 Lymphedema, not elsewhere classified 01/22/2016 No Yes E11.40 Type 2 diabetes mellitus with diabetic neuropathy, unspecified 01/22/2016 No Yes E11.622 Type 2 diabetes mellitus with  other skin ulcer 01/22/2016 No Yes L03.115 Cellulitis of right lower limb 12/22/2017 No Yes Inactive Problems ICD-10 Code Description Active Date Inactive Date 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 06/30/2018 06/30/2018 L97.511 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 of skin 06/30/2018 06/30/2018 Resolved Problems ICD-10 Code Description Active Date Resolved Date L97.211 Non-pressure chronic ulcer of right calf limited to breakdown of skin 06/30/2018 06/30/2018 L97.221 Non-pressure chronic ulcer of left calf limited to breakdown of skin 09/30/2016 09/30/2016 L03.116 Cellulitis of left lower limb 04/01/2017 04/01/2017 L97.211 Non-pressure chronic ulcer of right calf limited to breakdown of skin 06/30/2017 06/30/2017 Electronic Signature(s) Signed: 12/12/2021 10:30:18 AM By: Lenda Kelp PA-C Entered By: Lenda Kelp on 12/12/2021 10:30:16 -------------------------------------------------------------------------------- Progress Note Details Patient Name: Date of Service: [name], Joson J. 12/12/2021 10:30 A M Medical Record Number: [id number] Patient Account Number: [id number] Date of Birth/Sex: Treating RN: [birth year] (71 y.o. [name] Primary Care Provider: Nicoletta Ba Other Clinician: Referring Provider: Treating Provider/Extender: Adele Dan in Treatment: [phone] Subjective Chief Complaint Information obtained from Patient Patient is here for evaluation of venous/lymphedema ulcers History of Present Illness (HPI) Referred by PCP for consultation. Patient has long standing history of BLE venous stasis, no prior ulcerations. At beginning of month, developed cellulitis and weeping. Received IM Rocephin followed by Keflex and resolved. Wears compression stocking, appr 6 months old. Not sure strength. No present drainage. 01/22/16 this is a patient who is a type II diabetic on insulin. He  also has severe chronic bilateral venous insufficiency and inflammation. He tells me he religiously wears pressure stockings of uncertain strength. He was here with weeping edema about 8 months ago but did not have an open wound. Roughly a month ago he had a reopening on his bilateral legs. He is been using bandages and Neosporin. He does not complain of pain. 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 Last BUN/creatinine I have is from 11/20/15 at 13 and 1.0 respectively 01/29/16; patient arrives today having tolerated the Profore wrap. He brought in his stockings and these are 18 mmHg stockings he bought from [place]. The compression here is likely inadequate. He does not complain of pain or excessive drainage she has no systemic symptoms. The wound on the right looks improved as does the one on the left although one on the left is more substantial with still tissue at risk below the actual wound area on the bilateral posterior calf 02/05/16; patient arrives with poor edema control. He states that we did put a 4 layer compression on it last week. No weight appear 5 this. 02/12/16; the area on the posterior right Has healed. The left Has a substantial wound that has necrotic surface eschar that requires a debridement with a curette. 02/16/16;the patient called or a Nurse visit secondary to increased swelling. He had been  in earlier in the week with his right leg healed. He was transitioned to is on pressure stocking on the right leg with the only open wound on the left, a substantial area on the left posterior calf. Note he has a history of severe lower extremity edema, he has a history of chronic atrial fibrillation but not heart failure per my notes but I'll need to research this. He is not complaining of chest pain shortness of breath or orthopnea. The intake nurse noted blisters on the  previously closed right leg 02/19/16; this is the patient's regular visit day. I see him on Friday with escalating edema new wounds on the right leg and clear signs of at least right ventricular heart failure. I increased his Lasix to 40 twice a day. He is returning currently in follow-up. States he is noticed a decrease in that the edema 02/26/16 patient's legs have much less edema. There is nothing really open on the right leg. The left leg has improved condition of the large superficial wound on the posterior left leg 03/04/16; edema control is very much better. The patient's right leg wounds have healed. On the left leg he continues to have severe venous inflammation on the posterior aspect of the left leg. There is no tenderness and I don't think any of this is cellulitis. 03/11/16; patient's right leg is married healed and he is in his own stocking. The patient's left leg has deteriorated somewhat. There is a lot of erythema around the wound on the posterior left leg. There is also a significant rim of erythema posteriorly just above where the wrap would've ended there is a new wound in this location and a lot of tenderness. Can't rule out cellulitis in this area. 03/15/16; patient's right leg remains healed and he is in his own stocking. The patient's left leg is much better than last review. His major wound on the posterior aspect of his left Is almost fully epithelialized. He has 3 small injuries from the wraps. Really. Erythema seems a lot better on antibiotics 03/18/16; right leg remains healed and he is in his own stocking. The patient's left leg is much better. The area on the posterior aspect of the left calf is fully epithelialized. His 3 small injuries which were wrap injuries on the left are improved only one seems still open his erythema has resolved 03/25/16; patient's right leg remains healed and he is in his own stocking. There is no open area today on the left leg posterior leg is  completely closed up. His wrap injuries at the superior aspect of his leg are also resolved. He looks as though he has some irritation on the dorsal ankle but this is fully epithelialized without evidence of infection. 03/28/16; we discharged this patient on Monday. Transitioned him into his own stocking. There were problems almost immediately with uncontrolled swelling weeping edema multiple some of which have opened. He does not feel systemically unwell in particular no chest pain no shortness of breath and he does not feel 04/08/16; the edema is under better control with the Profore light wrap but he still has pitting edema. There is one large wound anteriorly 2 on the medial aspect of his left leg and 3 small areas on the superior posterior calf. Drainage is not excessive he is tolerating a Profore light well 04/15/16; put a Profore wrap on him last week. This is controlled is edema however he had a lot of pain on his left anterior foot most of  his wounds are healed 04/22/16 once again the patient has denuded areas on the left anterior foot which he states are because his wrap slips up word. He saw his primary physician today is on Lasix 40 twice a day and states that he his weight is down 20 pounds over the last 3 months. 04/29/16: Much improved. left anterior foot much improved. He is now on Lasix 80 mg per day. Much improved edema control 05/06/16; I was hoping to be able to discharge him today however once again he has blisters at a low level of where the compression was placed last week mostly on his left lateral but also his left medial leg and a small area on the anterior part of the left foot. 05/09/16; apparently the patient went home after his appointment on 7/4 later in the evening developing pain in his upper medial thigh together with subjective fever and chills although his temperature was not taken. The pain was so intense he felt he would probably have to call 911. However he then remembered  that he had leftover doxycycline from a previous round of antibiotics and took these. By the next morning he felt a lot better. He called and spoke to one of our nurses and I approved doxycycline over the phone thinking that this was in relation to the wounds we had previously seen although they were definitely were not. The patient feels a lot better old fever no chills he is still working. Blood sugars are reasonably controlled 05/13/16; patient is back in for review of his cellulitis on his anterior medial upper thigh. He is taking doxycycline this is a lot better. Culture I did of the nodular area on the dorsal aspect of his foot grew MRSA this also looks a lot better. 05/20/16; the patient is cellulitis on the medial upper thigh has resolved. All of his wound areas including the left anterior foot, areas on the medial aspect of the left calf and the lateral aspect of the calf at all resolved. He has a new blister on the left dorsal foot at the level of the fourth toe this was excised. No evidence of infection 05/27/16; patient continues to complain weeping edema. He has new blisterlike wounds on the left anterior lateral and posterior lateral calf at the top of his wrap levels. The area on his left anterior foot appears better. He is not complaining of fever, pain or pruritus in his feet. 05/30/16; the patient's blisters on his left anterior leg posterior calf all look improved. He did not increase the Lasix 100 mg as I suggested because he was going to run out of his 40 mg tablets. He is still having weeping edema of his toes 06/03/16; I renewed his Lasix at 80 mg once a day as he was about to run out when I last saw him. He is on 80 mg of Lasix now. I have asked him to cut down on the excessive amount of water he was drinking and asked him to drink according to his thirst mechanisms 06/12/2016 -- was seen 2 days ago and was supposed to wear his compression stockings at home but he is developed  lymphedema and superficial blisters on the left lower extremity and hence came in for a review 06/24/16; the remaining wound is on his left anterior leg. He still has edema coming from between his toes. There is lymphedema here however his edema is generally better than when I last saw this. He has a history of  atrial fibrillation but does not have a known history of congestive heart failure nevertheless I think he probably has this at least on a diastolic basis. 07/01/16 I reviewed his echocardiogram from January 2017. This was essentially normal. He did not have LVH, EF of 55-60%. His right ventricular function was normal although he did have trivial tricuspid and pulmonic regurgitation. This is not audible on exam however. I increased his Lasix to do massive edema in his legs well above his knees I think in early July. He was also drinking an excessive amount of water at the time. 07/15/16; missed his appointment last week because of the Labor Day holiday on Monday. He could not get another appointment later in the week. Started to feel the wrap digging in superiorly so we remove the top half and the bottom half of his wrap. He has extensive erythema and blistering superiorly in the left leg. Very tender. Very swollen. Edema in his foot with leaking edema fluid. He has not been systemically unwell 07/22/16; the area on the left leg laterally required some debridement. The medial wounds look more stable. His wrap injury wounds appear to have healed. Edema and his foot is better, weeping edema is also better. He tells me he is meeting with the supplier of the external compression pumps at work 08/05/16; the patient was on vacation last week in [facility]. His wrap is been on for an extended period of time. Also over the weekend he developed an extensive area of tender erythema across his anterior medial thigh. He took to doxycycline yesterday that he had leftover from a previous prescription.  The patient complains of weeping edema coming out of his toes 08/08/16; I saw this patient on 10/2. He was tender across his anterior thigh. I put him on doxycycline. He returns today in follow-up. He does not have any open wounds on his lower leg, he still has edema weeping into his toes. 08/12/16; patient was seen back urgently today to follow-up for his extensive left thigh cellulitis/erysipelas. He comes back with a lot less swelling and erythema pain is much better. I believe I gave him Augmentin and Cipro. His wrap was cut down as he stated a roll down his legs. He developed blistering above the level of the wrap that remained. He has 2 open blisters and 1 intact. 08/19/16; patient is been doing his primary doctor who is increased his Lasix from 40-80 once a day or 80 already has less edema. Cellulitis has remained improved in the left thigh. 2 open areas on the posterior left calf 08/26/16; he returns today having new open blisters on the anterior part of his left leg. He has his compression pumps but is not yet been shown how to use some vital representative from the supplier. 09/02/16 patient returns today with no open wounds on the left leg. Some maceration in his plantar toes 09/10/2016 -- Dr. Leanord Hawking had recently discharged him on 09/02/2016 and he has come right back with redness swelling and some open ulcers on his left lower extremity. He says this was caused by trying to apply his compression stockings and he's been unable to use this and has not been able to use his lymphedema pumps. He had some doxycycline leftover and he has started on this a few days ago. 09/16/16; there are no open wounds on his leg on the left and no evidence of cellulitis. He does continue to have probable lymphedema of his toes, drainage and maceration between his  toes. He does not complain of symptoms here. I am not clear use using his external compression pumps. 09/23/16; I have not seen this patient in 2  weeks. He canceled his appointment 10 days ago as he was going on vacation. He tells me that on Monday he noticed a large area on his posterior left leg which is been draining copiously and is reopened into a large wound. He is been using ABDs and the external part of his juxtalite, according to our nurse this was not on properly. 10/07/16; Still a substantial area on the posterior left leg. Using silver alginate 10/14/16; in general better although there is still open area which looks healthy. Still using silver alginate. He reminds me that this happen before he left for [facility]. T oday while he was showering in the morning. He had been using his juxtalite's 10/21/16; the area on his posterior left leg is fully epithelialized. However he arrives today with a large area of tender erythema in his medial and posterior left thigh just above the knee. I have marked the area. Once again he is reluctant to consider hospitalization. I treated him with oral antibiotics in the past for a similar situation with resolution I think with doxycycline however this area it seems more extensive to me. He is not complaining of fever but does have chills and says states he is thirsty. His blood sugar today was in the 140s at home 10/25/16 the area on his posterior left leg is fully epithelialized although there is still some weeping edema. The large area of tenderness and erythema in his medial and posterior left thigh is a lot less tender although there is still a lot of swelling in this thigh. He states he feels a lot better. He is on doxycycline and Augmentin that I started last week. This will continued until Tuesday, December 26. I have ordered a duplex ultrasound of the left thigh rule out DVT whether there is an abscess something that would need to be drained I would also like to know. 11/01/16; he still has weeping edema from a not fully epithelialized area on his left posterior calf. Most of the rest of this  looks a lot better. He has completed his antibiotics. His thigh is a lot better. Duplex ultrasound did not show a DVT in the thigh 11/08/16; he comes in today with more Denuded surface epithelium from the posterior aspect of his calf. There is no real evidence of cellulitis. The superior aspect of his wrap appears to have put quite an indentation in his leg just below the knee and this may have contributed. He does not complain of pain or fever. We have been using silver alginate as the primary dressing. The area of cellulitis in the right thigh has totally resolved. He has been using his compression stockings once a week 11/15/16; the patient arrives today with more loss of epithelium from the posterior aspect of his left calf. He now has a fairly substantial wound in this area. The reason behind this deterioration isn't exactly clear although his edema is not well controlled. He states he feels he is generally more swollen systemically. He is not complaining of chest pain shortness of breath fever. T me he has an appointment with his primary physician in early February. He is on 80 mg of oral ells Lasix a day. He claims compliance with the external compression pumps. He is not having any pain in his legs similar to what he has with  his recurrent cellulitis 11/22/16; the patient arrives a follow-up of his large area on his left lateral calf. This looks somewhat better today. He came in earlier in the week for a dressing change since I saw him a week ago. He is not complaining of any pain no shortness of breath no chest pain 11/28/16; the patient arrives for follow-up of his large area on the left lateral calf this does not look better. In fact it is larger weeping edema. The surface of the wound does not look too bad. We have been using silver alginate although I'm not certain that this is a dressing issue. 12/05/16; again the patient follows up for a large wound on the left lateral and left posterior calf  this does not look better. There continues to be weeping edema necrotic surface tissue. More worrisome than this once again there is erythema below the wound involving the distal Achilles and heel suggestive of cellulitis. He is on his feet working most of the day of this is not going well. We are changing his dressing twice a week to facilitate the drainage. 12/12/16; not much change in the overall dimensions of the large area on the left posterior calf. This is very inflamed looking. I gave him an. Doxycycline last week does not really seem to have helped. He found the wrap very painful indeed it seems to of dog into his legs superiorly and perhaps around the heel. He came in early today because the drainage had soaked through his dressings. 12/19/16- patient arrives for follow-up evaluation of his left lower extremity ulcers. He states that he is using his lymphedema pumps once daily when there is "no drainage". He admits to not using his lipedema pumps while under current treatment. His blood sugars have been consistently between 150-200. 12/26/16; the patient is not using his compression pumps at home because of the wetness on his feet. I've advised him that I think it's important for him to use this daily. He finds his feet too wet, he can put a plastic bag over his legs while he is in the pumps. Otherwise I think will be in a vicious circle. We are using silver alginate to the major area on his left posterior calf 01/02/17; the patient's posterior left leg has further of all into 3 open wounds. All of them covered with a necrotic surface. He claims to be using his compression pumps once a day. His edema control is marginal. Continue with silver alginate 01/10/17; the patient's left posterior leg actually looks somewhat better. There is less edema, less erythema. Still has 3 open areas covered with a necrotic surface requiring debridement. He claims to be using his compression pumps once a day his edema  control is better 01/17/17; the patient's left posterior calf look better last week when I saw him and his wrap was changed 2 days ago. He has noted increasing pain in the left heel and arrives today with much larger wounds extensive erythema extending down into the entire heel area especially tender medially. He is not systemically unwell CBGs have been controlled no fever. Our intake nurse showed me limegreen drainage on his AVD pads. 01/24/17; his usual this patient responds nicely to antibiotics last week giving him Levaquin for presumed Pseudomonas. The whole entire posterior part of his leg is much better much less inflamed and in the case of his Achilles heel area much less tender. He has also had some epithelialization posteriorly there are still open areas here and  still draining but overall considerably better 01/31/17- He has continue to tolerate the compression wraps. he states that he continues to use the lymphedema pumps daily, and can increase to twice daily on the weekends. He is voicing no complaints or concerns regarding his LLE ulcers 02/07/17-he is here for follow-up evaluation. He states that he noted some erythema to the left medial and anterior thigh, which he states is new as of yesterday. He is concerned about recurrent cellulitis. He states his blood sugars have been slightly elevated, this morning in the 180s 02/14/17; he is here for follow-up evaluation. When he was last here there was erythema superiorly from his posterior wound in his anterior thigh. He was prescribed Levaquin however a culture of the wound surface grew MRSA over the phone I changed him to doxycycline on Monday and things seem to be a lot better. 02/24/17; patient missed his appointment on Friday therefore we changed his nurse visit into a physician visit today. Still using silver alginate on the large area of the posterior left thigh. He isn't new area on the dorsal left second toe 03/03/17; actually better  today although he admits he has not used his external compression pumps in the last 2 days or so because of work responsibilities over the weekend. 03/10/17; continued improvement. External compression pumps once a day almost all of his wounds have closed on the posterior left calf. Better edema control 03/17/17; in general improved. He still has 3 small open areas on the lateral aspect of his left leg however most of the area on the posterior part of his leg is epithelialized. He has better edema control. He has an ABD pad under his stocking on the right anterior lower leg although he did not let us look at that today. 03/24/17; patient arrives back in clinic today with no open areas however there are areas on the posterior left calf and anterior left calf that are less than 100% epithelialized. His edema is well controlled in the left lower leg. There is some pitting edema probably lymphedema in the left upper thigh. He uses compression pumps at home once per day. I tried to get him to do this twice a day although he is very reticent. 04/01/2017 -- for the last 2 days he's had significant redness, tenderness and weeping and came in for an urgent visit today. 04/07/17; patient still has 6 more days of doxycycline. He was seen by Dr. Meyer Russel last Wednesday for cellulitis involving the posterior aspect, lateral aspect of his Involving his heel. For the most part he is better there is less erythema and less weeping. He has been on his feet for 12 hours o2 over the weekend. Using his compression pumps once a day 04/14/17 arrives today with continued improvement. Only one area on the posterior left calf that is not fully epithelialized. He has intense bilateral venous inflammation associated with his chronic venous insufficiency disease and secondary lymphedema. We have been using silver alginate to the left posterior calf wound In passing he tells [place] today that the right leg but we have not seen in quite some  time has an open area on it but he doesn't want [place] to look at this today states he will show this to [place] next week. 04/21/17; there is no open area on his left leg although he still reports some weeping edema. He showed [place] his right leg today which is the first time we've seen this leg in a long time. He has  a large area of open wound on the right leg anteriorly healthy granulation. Quite a bit of swelling in the right leg and some degree of venous inflammation. He told us about the right leg in passing last week but states that deterioration in the right leg really only happened over the weekend 04/28/17; there is no open area on the left leg although there is an irritated part on the posterior which is like a wrap injury. The wound on the right leg which was new from last week at least to [place] is a lot better. 05/05/17; still no open area on the left leg. Patient is using his new compression stocking which seems to be doing a good job of controlling the edema. He states he is using his compression pumps once per day. The right leg still has an open wound although it is better in terms of surface area. Required debridement. A lot of pain in the posterior right Achilles marked tenderness. Usually this type of presentation this patient gives concern for an active cellulitis 05/12/17; patient arrives today with his major wound from last week on the right lateral leg somewhat better. Still requiring debridement. He was using his compression stocking on the left leg however that is reopened with superficial wounds anteriorly he did not have an open wound on this leg previously. He is still using his juxta light's once daily at night. He cannot find the time to do this in the morning as he has to be at work by 7 AM 05/19/17; right lateral leg wound looks improved. No debridement required. The concerning area is on the left posterior leg which appears to almost have a subcutaneous hemorrhagic component to it. We've  been using silver alginate to all the wounds 05/26/17; the right lateral leg wound continues to look improved. However the area on the left posterior calf is a tightly adherent surface. [place] using silver alginate. Because of the weeping edema in his legs there is very little good alternatives. 06/02/17; the patient left here last week looking quite good. Major wound on the left posterior calf and a small one on the right lateral calf. Both of these look satisfactory. He tells me that by Wednesday he had noted increased pain in the left leg and drainage. He called on Thursday and Friday to get an appointment here but we were blocked. He did not go to urgent care or his primary physician. He thinks he had a fever on Thursday but did not actually take his temperature. He has not been using his compression pumps on the left leg because of pain. I advised him to go to the emergency room today for IV antibiotics for stents of left leg cellulitis but he has refused I have asked him to take 2 days off work to keep his leg elevated and he has refused this as well. In view of this I'm going to call him and Augmentin and doxycycline. 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 However after a detailed discussion regarding the risk benefits and alternatives he refuses to get admitted to the hospital. With no other recourse I will continue him on oral antibiotics as before and hopefully he'll have his infectious disease consultation this week. 06/16/2017 -- the  patient was seen today by the nurse practitioner at infectious disease Ms. Dixon. Her review noted recurrent cellulitis of the lower extremity with tinea pedis of the left foot and she has recommended clindamycin 150 mg  daily for now and she may increase it to 300 mg daily to cover staph and Streptococcus. He has also been advise Lotrimin cream locally. she also had wise IV antibiotics for his condition if it flares up 06/23/17; patient arrives today with drainage bilaterally although the remaining wound on the left posterior calf after cleaning up today "highlighter yellow drainage" did not look too bad. Unfortunately he has had breakdown on the right anterior leg [previously this leg had not been open and he is using a black stocking] he went to see infectious disease and is been put on clindamycin 150 mg daily, I did not verify the dose although I'm not familiar with using clindamycin in this dosing range, perhaps for prophylaxisoo 06/27/17; I brought this patient back today to follow-up on the wound deterioration on the right lower leg together with surrounding cellulitis. I started him on doxycycline 4 days ago. This area looks better however he comes in today with intense cellulitis on the medial part of his left thigh. This is not have a wound in this area. Extremely tender. We've been using silver alginate to the wounds on the right lower leg left lower leg with bilateral 4 layer compression he is using his external compression pumps once a day 07/04/17; patient's left medial thigh cellulitis looks better. He has not been using his compression pumps as his insert said it was contraindicated with cellulitis. His right leg continues to make improvements all the wounds are still open. We only have one remaining wound on the left posterior calf. Using silver alginate to all open areas. He is on doxycycline which I started a week ago and should be finishing I gave him Augmentin after Thursday's visit for the severe cellulitis on the left medial thigh which fortunately looks better 07/14/17; the patient's left medial thigh cellulitis has resolved. The cellulitis in his right lower calf on the right also looks better.  All of his wounds are stable to improved we've been using silver alginate he has completed the antibiotics I have given him. He has clindamycin 150 mg once a day prescribed by infectious disease for prophylaxis, I've advised him to start this now. We have been using bilateral Unna boots over silver alginate to the wound areas 07/21/17; the patient is been to see infectious disease who noted his recurrent problems with cellulitis. He was not able to tolerate prophylactic clindamycin therefore he is on amoxicillin 500 twice a day. He also had a second daily dose of Lasix added By Dr. Oneta Rack but he is not taking this. Nor is he being completely compliant with his compression pumps a especially not this week. He has 2 remaining wounds one on the right posterior lateral lower leg and one on the left posterior medial lower leg. 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 The patient has Unna boots bilaterally. 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 He has bilateral Unna boots for compression and silver alginate to his wounds. Arrives today with his legs looking as good as I have seen him in quite some time.  Not surprisingly his wounds look better as well with improvement on the right lateral leg venous insufficiency wound and also the left medial leg. He is still using the compression pumps once a day 08/11/17; both legs appear to be doing better wounds on the right lateral and left medial legs look better. Skin on the right leg quite good. He is been using silver alginate as the primary dressing. I'm going to use Anasept gel calcium alginate and maintain all the secondary dressings 08/18/17; the patient continues to actually do quite well. The area on his right lateral leg is just  about closed the left medial also looks better although it is still moist in this area. His edema is well controlled we have been using Anasept gel with calcium alginate and the usual secondary dressings, 4 layer compression and once daily use of his compression pumps "always been able to manage 09/01/17; the patient continues to do reasonably well in spite of his trip to [place]. The area on the right lateral leg is epithelialized. Left is much better but still open. He has more edema and more chronic erythema on the left leg [venous inflammation] 09/08/17; he arrives today with no open wound on the right lateral leg and decently controlled edema. Unfortunately his left leg is not nearly as in his good situation as last week.he apparently had increasing edema starting on Saturday. He edema soaked through into his foot so used a plastic bag to walk around his home. The area on the medial right leg which was his open area is about the same however he has lost surface epithelium on the left lateral which is new and he has significant pain in the Achilles area of the left foot. He is already on amoxicillin chronically for prophylaxis of cellulitis in the left leg 09/15/17; he is completed a week of doxycycline and the cellulitis in the left posterior leg and Achilles area is as usual improved. He still has a lot of edema and fluid soaking through his dressings. There is no open wound on the right leg. He saw infectious disease NP today 09/22/17;As usual 1 we transition him from our compression wraps to his stockings things did not go well. He has several small open areas on the right leg. He states this was caused by the compression wrap on his skin although he did not wear this with the stockings over them. He has several superficial areas on the left leg medially laterally posteriorly. He does not have any evidence of active cellulitis especially involving the left Achilles The patient is traveling  from [facility] Saturday going to [facility]. He states he isn't attempting to get an appointment with a heel objects wound center there to change his dressings. I am not completely certain whether this will work 10/06/17; the patient came in on Friday for a nurse visit and the nurse reported that his legs actually look quite good. He arrives in clinic today for his regular follow-up visit. He has a new wound on his left third toe over the PIP probably caused by friction with his footwear. He has small areas on the left leg and a very superficial but epithelialized area on the right anterior lateral lower leg. Other than that his legs look as good as I've seen him in quite some time. We have been using silver alginate Review of systems; no chest pain no shortness of breath other than this a 10 point review of systems negative 10/20/17; seen by  Dr. Meyer Russel last week. He had taken some antibiotics [doxycycline] that he had left over. Dr. Meyer Russel thought he had candida infection and declined to give him further antibiotics. He has a small wound remaining on the right lateral leg several areas on the left leg including a larger area on the left posterior several left medial and anterior and a small wound on the left lateral. The area on the left dorsal third toe looks a lot better. ROS; Gen.; no fever, respiratory no cough no sputum Cardiac no chest pain other than this 10 point review of system is negative 10/30/17; patient arrives today having fallen in the bathtub 3 days ago. It took him a while to get up. He has pain and maceration in the wounds on his left leg which have deteriorated. He has not been using his pumps he also has some maceration on the right lateral leg. 11/03/17; patient continues to have weeping edema especially in the left leg. This saturates his dressings which were just put on on 12/27. As usual the doxycycline seems to take care of the cellulitis on his lower leg. He is not  complaining of fever, chills, or other systemic symptoms. He states his leg feels a lot better on the doxycycline I gave him empirically. He also apparently gets injections at his primary doctor's officeo Rocephin for cellulitis prophylaxis. I didn't ask him about his compression pump compliance today I think that's probably marginal. Arrives in the clinic with all of his dressings primary and secondary macerated full of fluid and he has bilateral edema 11/10/17; the patient's right leg looks some better although there is still a cluster of wounds on the right lateral. The left leg is inflamed with almost circumferential skin loss medially to laterally although we are still maintaining anteriorly. He does not have overt cellulitis there is a lot of drainage. He is not using compression pumps. We have been using silver alginate to the wound areas, there are not a lot of options here 11/17/17; the patient's right leg continues to be stable although there is still open wounds, better than last week. The inflammation in the left leg is better. Still loss of surface layer epithelium especially posteriorly. There is no overt cellulitis in the amount of edema and his left leg is really quite good, tells me he is using his compression pumps once a day. 11/24/17; patient's right leg has a small superficial wound laterally this continues to improve. The inflammation in the left leg is still improving however we have continuous surface layer epithelial loss posteriorly. There is no overt cellulitis in the amount of edema in both legs is really quite good. He states he is using his compression pumps on the left leg once a day for 5 out of 7 days 12/01/17; very small superficial areas on the right lateral leg continue to improve. Edema control in both legs is better today. He has continued loss of surface epithelialization and left posterior calf although I think this is better. We have been using silver alginate with  large number of absorptive secondary dressings 4 layer on the left Unna boot on the right at his request. He tells me he is using his compression pumps once a day 12/08/17; he has no open area on the right leg is edema control is good here. ooOn the left leg however he has marked erythema and tenderness breakdown of skin. He has what appears to be a wrap injury just distal to the popliteal fossa.  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 There was swelling erythema and breakdown in the right leg. He has much worse skin breakdown in the left leg as well multiple open areas medially and posteriorly as well as laterally. He tells me he has been using his compression pumps but tells me he feels that the drainage out of his leg is worse when he uses a compression pumps. T be fair to him he is been saying this o for a while however I don't know that I have really been listening to this. I wonder if the compression pumps are working properly 12/22/17;. Once again he arrives with severe erythema, weeping edema from the left greater than right leg. Noncompliance with compression pumps. New this visit he is complaining of pain on the lateral aspect of the right leg and the medial aspect of his right thigh. He apparently saw his cardiologist Dr. [name] who was ordered an echocardiogram area and I think this is a step in the right direction 12/25/17; started his doxycycline Monday night. There is still intense erythema of the right leg especially in the anterior thigh although there is less tenderness. The erythema around the wound on the right lateral calf also is less tender. He still complaining of pain in the left heel. His wounds are  about the same right lateral left medial left lateral. Superficial but certainly not close to closure. He denies being systemically unwell no fever chills no abdominal pain no diarrhea 12/29/17; back in follow-up of his extensive right calf and right thigh cellulitis. I added amoxicillin to cover possible doxycycline resistant strep. This seems to of done the trick he is in much less pain there is much less erythema and swelling. He has his echocardiogram at 11:00 this morning. X-ray of the left heel was also negative. 01/05/18; the patient arrived with his edema under much better control. Now that he is retired he is able to use his compression pumps daily and sometimes twice a day per the patient. He has a wound on the right leg the lateral wound looks better. Area on the left leg also looks a lot better. He has no evidence of cellulitis in his bilateral thighs I had a quick peak at his echocardiogram. He is in normal ejection fraction and normal left ventricular function. He has moderate pulmonary hypertension moderately reduced right ventricular function. One would have to wonder about chronic sleep apnea although he says he doesn't snore. He'll review the echocardiogram with his cardiologist. 01/12/18; the patient arrives with the edema in both legs under exemplary control. He is using his compression pumps daily and sometimes twice daily. His wound on the right lateral leg is just about closed. He still has some weeping areas on the posterior left calf and lateral left calf although everything is just about closed here as well. I have spoken with Aldean Baker who is the patient's nurse practitioner and infectious disease. She was concerned that the patient had not understood that the parenteral penicillin injections he was receiving for cellulitis prophylaxis was actually benefiting him. I don't think the patient actually saw that I would tend to agree we were certainly dealing with less  infections although he had a serious  one last month. 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 He is healed. He'll be placed in juxtalite compression wraps and increase his lymphedema pumps to twice daily. We will follow up again next week to ensure there are no issues with the new regiment. 01/20/18-he is here for evaluation of bilateral lower extremity weeping edema. Yesterday he was placed in compression wrap to the right lower extremity and compression stocking to left lower shrubbery. He states he uses lymphedema pumps last night and again this morning and noted a blister to the left lower extremity. On exam he was noted to have drainage to the right lower extremity. He will be placed in Unna boots bilaterally and follow-up next week 01/26/18; patient was actually discharged a week ago to his own juxta light stockings only to return the next day with bilateral lower extremity weeping edema.he was placed in bilateral Unna boots. He arrives today with pain in the back of his left leg. There is no open area on the right leg however there is a linear/wrap injury on the left leg and weeping edema on the left leg posteriorly. I spoke with infectious disease about 10 days ago. They were disappointed that the patient elected to discontinue prophylactic intramuscular penicillin shots as they felt it was particularly beneficial in reducing the frequency of his cellulitis. I discussed this with the patient today. He does not share this view. He'll definitely need antibiotics today. Finally he is traveling to [place] and trauma leaving this Saturday and returning a week later and he does not travel with his pumps. He is going by car 01/30/18; patient was seen 4 days ago and brought back in today for review of cellulitis in the left leg posteriorly. I put him on amoxicillin this really hasn't helped as much as I might like. He is also worried because he is traveling to  [facility] by car. Finally we will be rewrapping him. There is no open area on the right leg over his left leg has multiple weeping areas as usual 02/09/18; The same wrap on for 10 days. He did not pick up the last doxycycline I prescribed for him. He apparently took 4 days worth he already had. There is nothing open on his right leg and the edema control is really quite good. He's had damage in the left leg medially and laterally especially probably related to the prolonged use of Unna boots 02/12/18; the patient arrived in clinic today for a nurse visit/wrap change. He complained of a lot of pain in the left posterior calf. He is taking doxycycline that I previously prescribed for him. Unfortunately even though he used his stockings and apparently used to compression pumps twice a day he has weeping edema coming out of the lateral part of his right leg. This is coming from the lower anterior lateral skin area. 02/16/18; the patient has finished his doxycycline and will finish the amoxicillin 2 days. The area of cellulitis in the left calf posteriorly has resolved. He is no longer having any pain. He tells me he is using his compression pumps at least once a day sometimes twice. 02/23/18; the patient finished his doxycycline and Amoxil last week. On Friday he noticed a small erythematous circle about the size of a quarter on the left lower leg just above his ankle. This rapidly expanded and he now has erythema on the lateral and posterior part of the thigh. This is bright red. Also has an area  on the dorsal foot just above his toes and a tender area just below the left popliteal fossa. He came off his prophylactic penicillin injections at his own insistence one or 2 months ago. This is obviously deteriorated since then 03/02/18; patient is on doxycycline and Amoxil. Culture I did last week of the weeping area on the back of his left calf grew group B strep. I have therefore renewed the amoxicillin  500 3 times a day for a further week. He has not been systemically unwell. Still complaining of an area of discomfort right under his left popliteal fossa. There is no open wound on the right leg. He tells me that he is using his pumps twice a day on most days 03/09/18; patient arrives in clinic today completing his amoxicillin today. The cellulitis on his left leg is better. Furthermore he tells me that he had intramuscular penicillin shots that his primary care office today. However he also states that the wrap on his right leg fell down shortly after leaving clinic last week. He developed a large blister that was present when he came in for a nurse visit later in the week and then he developed intense discomfort around this area.He tells me he is using his compression pumps 03/16/18; the patient has completed his doxycycline. The infectious part of this/cellulitis in the left heel area left popliteal area is a lot better. He has 2 open areas on the right calf. Still areas on the left calf but this is a lot better as well. 03/24/18; the patient arrives complaining of pain in the left popliteal area again. He thinks some of this is wrap injury. He has no open area on the right leg and really no open area on the left calf either except for the popliteal area. He claims to be compliant with the compression pumps 03/31/18; I gave him doxycycline last week because of cellulitis in the left popliteal area. This is a lot better although the surface epithelium is denuded off and response to this. He arrives today with uncontrolled edema in the right calf area as well as a fingernail injury in the right lateral calf. There is only a few open areas on the left 04/06/18; I gave him amoxicillin doxycycline over the last 2 weeks that the amoxicillin should be completing currently. He is not complaining of any pain or systemic symptoms. The only open areas see has is on the right lateral lower leg paradoxically I cannot  see anything on the left lower leg. He tells me he is using his compression pumps twice a day on most days. Silver alginate to the wounds that are open under 4 layer compression 04/13/18; he completed antibiotics and has no new complaints. Using his compression pumps. Silver alginate that anything that's opened 04/20/18; he is using his compression pumps religiously. Silver alginate 4 layer compression anything that's opened. He comes in today with no open wounds on the left leg but 3 on the right including a new one posteriorly. He has 2 on the right lateral and one on the right posterior. He likes Unna boots on the right leg for reasons that aren't really clear we had the usual 4 layer compression on the left. It may be necessary to move to the 4 layer compression on the right however for now I left them in the Unna boots 04/27/18; he is using his compression pumps at least once a day. He has still the wounds on the right lateral calf. The  area right posteriorly has closed. He does not have an open wound on the left under 4 layer compression however on the dorsal left foot just proximal to the toes and the left third toe 2 small open areas were identified 05/11/18; he has not uses compression pumps. The areas on the right lateral calf have coalesced into one large wound necrotic surface. On the left side he has one small wound anteriorly however the edema is now weeping out of a large part of his left leg. He says he wasn't using his pumps because of the weeping fluid. I explained to him that this is the time he needs to pump more 05/18/18; patient states he is using his compression pumps twice a day. The area on the right lateral large wound albeit superficial. On the left side he has innumerable number of small new wounds on the left calf particularly laterally but several anteriorly and medially. All these appear to have healthy granulated base these look like the remnants of blisters however they  occurred under compression. The patient arrives in clinic today with his legs somewhat better. There is certainly less edema, less multiple open areas on the left calf and the right anterior leg looks somewhat better as well superficial and a little smaller. However he relates pain and erythema over the last 3-4 days in the thigh and I looked at this today. He has not been systemically unwell no fever no chills no change in blood sugar values 05/25/18; comes in today in a better state. The severe cellulitis on his left leg seems better with the Keflex. Not as tender. He has not been systemically unwell ooHard to find an open wound on the left lower leg using his compression pumps twice a day ooThe confluent wounds on his right lateral calf somewhat better looking. These will ultimately need debridement I didn't do this today. 06/01/18; the severe cellulitis on the left anterior thigh has resolved and he is completed his Keflex. ooThere is no open wound on the left leg however there is a superficial excoriation at the base of the third toe dorsally. Skin on the bottom of his left foot is macerated looking. ooThe left the wounds on the lateral right leg actually looks some better although he did require debridement of the top half of this wound area with an open curet 06/09/18 on evaluation today patient appears to be doing poorly in regard to his right lower extremity in particular this appears to likely be infected he has very thick purulent discharge along with a bright green tent to the discharge. This makes me concerned about the possibility of pseudomonas. He's also having increased discomfort at this point on evaluation. Fortunately there does not appear to be any evidence of infection spreading to the other location at this time. 06/16/18 on evaluation today patient appears to actually be doing fairly well. His ulcer has actually diminished in size quite significantly at this point which is good  news. Nonetheless he still does have some evidence of infection he did see infectious disease this morning before coming here for his appointment. I did review the results of their evaluation and their note today. They did actually have him discontinue the Cipro and initiate treatment with linezolid at this time. He is doing this for the next seven days and they recommended a follow-up in four months with them. He is the keep a log of the need for intermittent antibiotic therapy between now and when he falls back  up with infectious disease. This will help them gaze what exactly they need to do to try and help them out. 06/23/18; the patient arrives today with no open wounds on the left leg and left third toe healed. He is been using his compression pumps twice a day. On the right lateral leg he still has a sizable wound but this is a lot better than last time I saw this. 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 Has been using silver alginate under 4 layer compression 06/30/18; the only open wound he has is on the right lateral leg and this looks healthy. No debridement is required. We have been using silver alginate. He does not have an open wound on the left leg. There is apparently some drainage from the dorsal proximal third toe on the left although I see no open wound here. 07/03/18 on evaluation today patient was actually here just for a nurse visit rapid change. However when he was here on Wednesday for his rat change due to having been healed on the left and then developing blisters we initiated the wrap again knowing that he would be back today for [place] to reevaluate and see were at. Unfortunately he has developed some cellulitis into the proximal portion of his right lower extremity even into the region of his thigh. He did test positive for MRSA on the last culture which was reported back on  06/23/18. He was placed on one as what at that point. Nonetheless he is done with that and has been tolerating it well otherwise. Doxycycline which in the past really did not seem to be effective for him. Nonetheless I think the best option may be for [place] to definitely reinitiate the antibiotics for a longer period of time. 07/07/18; since I last saw this patient a week ago he has had a difficult time. At that point he did not have an open wound on his left leg. We transitioned him into juxta light stockings. He was apparently in the clinic the next day with blisters on the left lateral and left medial lower calf. He also had weeping edema fluid. He was put back into a compression wrap. He was also in the clinic on Friday with intense erythema in his right thigh. Per the patient he was started on Bactrim however that didn't work at all in terms of relieving his pain and swelling. He has taken 3 doxycycline that he had left over from last time and that seems to of helped. He has blistering on the right thigh as well. 07/14/18; the erythema on his right thigh has gotten better with doxycycline that he is finishing. 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 Presumably this cellulitis in the thigh was not related to that although I think this is a bit concerning going forward. He still has an area on the right lateral calf the blister on the right medial calf just below the knee that was discussed above. On the left 2 small open areas left medial and left lateral. Edema control is adequate. He is using his compression pumps twice a day 07/20/18; continued improvement in the condition of both legs especially the edema in his bilateral thighs. He tells me he is been losing weight through a combination of diet and exercise. He is using his  compression pumps twice a day. So overall she made to the remaining wounds 07/27/2018; continued  improvement in condition of both legs. His edema is well controlled. The area on the right lateral leg is just about closed he had one blisters show up on the medial left upper calf. We have him in 4 layer compression. He is going on a 10-day trip to [place], [place]. He will be driving. He wants to wear Unna boots because of the lessening amount of constriction. He will not use compression pumps while he is away 08/05/18 on evaluation today patient actually appears to be doing decently well all things considered in regard to his bilateral lower extremities. The worst ulcer is actually only posterior aspect of his left lower extremity with a four layer compression wrap cut into his leg a couple weeks back. He did have a trip and actually had [employer] for the trip that he is worn since he was last here. Nonetheless he feels like the [employer] actually do better for him his swelling is up a little bit but he also with his trip was not taking his Lasix on a regular set schedule like he was supposed to be. He states that obviously the reason being that he cannot drive and keep going without having to urinate too frequently which makes it difficult. He did not have his pumps with him while he was away either which I think also maybe playing a role here too. 08/13/2018; the patient only has a small open wound on the right lateral calf which is a big improvement in the last month or 2. He also has the area posteriorly just below the posterior fossa on the left which I think was a wrap injury from several weeks ago. He has no current evidence of cellulitis. He tells me he is back into his compression pumps twice a day. He also tells me that while he was at the laundromat somebody stole a section of his extremitease stockings 08/20/2018; back in the clinic with a much improved state. He only has small areas on the right lateral mid calf which is just about healed. This was is  more substantial area for quite a prolonged period of time. He has a small open area on the left anterior tibia. The area on the posterior calf just below the popliteal fossa is closed today. He is using his compression pumps twice a day 08/28/2018; patient has no open wound on the right leg. He has a smattering of open areas on the calf with some weeping lymphedema. More problematically than that it looks as though his wraps of slipped down in his usual he has very angry upper area of edema just below the right medial knee and on the right lateral calf. He has no open area on his feet. The patient is traveling to [facility] next week. I will send him in an antibiotic. We will continue to wrap the right leg. We ordered extremitease stockings for him last week and I plan to transition the right leg to a stocking when he gets home which will be in 10 days time. As usual he is very reluctant to take his pumps with him when he travels 09/07/2018; patient returns from [facility]. He shows me a picture of his left leg in the mid part of his trip last week with intense fire engine erythema. The picture look bad enough I would have considered sending him to  the hospital. Instead he went to the wound care center in [facility]. They did not prescribe him antibiotics but he did take some doxycycline he had leftover from a previous visit. I had given him trimethoprim sulfamethoxazole before he left this did not work according to the patient. This is resulted in some improvement fortunately. He comes back with a large wound on the left posterior calf. Smaller area on the left anterior tibia. Denuded blisters on the dorsal left foot over his toes. Does not have much in the way of wounds on the right leg although he does have a very tender area on the right posterior area just below the popliteal fossa also suggestive of infection. He promises me he is back on his pumps twice a day 09/15/2018; the  intense cellulitis in his left lower calf is a lot better. The wound area on the posterior left calf is also so better. However he has reasonably extensive wounds on the dorsal aspect of his second and third toes and the proximal foot just at the base of the toes. There is nothing open on the right leg 09/22/2018; the patient has excellent edema control in his legs bilaterally. He is using his external compression pumps twice a day. He has no open area on the right leg and only the areas in the left foot dorsally second and third toe area on the left side. He does not have any signs of active cellulitis. 10/06/2018; the patient has good edema control bilaterally. He has no open wound on the right leg. There is a blister in the posterior aspect of his left calf that we had to deal with today. He is using his compression pumps twice a day. There is no signs of active cellulitis. We have been using silver alginate to the wound areas. He still has vulnerable areas on the base of his left first second toes dorsally He has a his extremities stockings and we are going to transition him today into the stocking on the right leg. He is cautioned that he will need to continue to use the compression pumps twice a day. If he notices uncontrolled edema in the right leg he may need to go to 3 times a day. 10/13/2018; the patient came in for a nurse check on Friday he has a large flaccid blister on the right medial calf just below the knee. We unroofed this. He has this and a new area underneath the posterior mid calf which was undoubtedly a blister as well. He also has several small areas on the right which is the area we put his extremities stocking on. 10/19/2018; the patient went to see infectious disease this morning I am not sure if that was a routine follow-up in any case the doxycycline I had given him was discontinued and started on linezolid. He has not started this. It is easy to look at his left calf and the  inflammation and think this is cellulitis however he is very tender in the tissue just below the popliteal fossa and I have no doubt that there is infection going on here. He states the problem he is having is that with the compression pumps the edema goes down and then starts walking the wrap falls down. We will see if we can adhere this. He has 1 or 2 minuscule open areas on the right still areas that are weeping on the posterior left calf, the base of his left second and third toes 10/26/18; back  today in clinic with quite of skin breakdown in his left anterior leg. This may have been infection the area below the popliteal fossa seems a lot better however tremendous epithelial loss on the left anterior mid tibia area over quite inexpensive tissue. He has 2 blisters on the right side but no other open wound here. 10/29/2018; came in urgently to see [place] today and we worked him in for review. He states that the 4 layer compression on the right leg caused pain he had to cut it down to roughly his mid calf this caused swelling above the wrap and he has blisters and skin breakdown today. As a result of the pain he has not been using his pumps. Both legs are a lot more edematous and there is a lot of weeping fluid. 11/02/18; arrives in clinic with continued difficulties in the right leg> left. Leg is swollen and painful. multiple skin blisters and new open areas especially laterally. He has not been using his pumps on the right leg. 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 He is not systemically unwell. 11/09/2018; the patient claims he is being compliant with his pumps. He is finished the doxycycline I gave him last week. Culture I did of the wound on the right lateral leg showed a few very resistant methicillin staph aureus. This was resistant to doxycycline. Nevertheless he states the pain in the leg is a lot better which makes me wonder if the cultured  organism was not really what was causing the problem nevertheless this is a very dangerous organism to be culturing out of any wound. His right leg is still a lot larger than the left. He is using an Radio broadcast assistant on this area, he blames a 4-layer compression for causing the original skin breakdown which I doubt is true however I cannot talk him out of it. We have been using silver alginate to all of these areas which were initially blisters 11/16/2018; patient is being compliant with his external compression pumps at twice a day. Miraculously he arrives in clinic today with absolutely no open wounds. He has better edema control on the left where he has been using 4 layer compression versus wound of wounds on the right and I pointed this out to him. There is no inflammation in the skin in his lower legs which is also somewhat unusual for him. There is no open wounds on the dorsal left foot. He has extremitease stockings at home and I have asked him to bring these in next week. 11/25/18 patient's lower extremity on examination today on the left appears for the most part to be wound free. He does have an open wound on the lateral aspect of the right lower extremity but this is minimal compared to what I've seen in past. He does request that we go ahead and wrap the left leg as well even though there's nothing open just so hopefully it will not reopen in short order. 1/28; patient has superficial open wounds on the right lateral calf left anterior calf and left posterior calf. His edema control is adequate. 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 We have been using silver alginate as the primary dressing. He claims compliance with his compression pumps 2/4; patient has superficial open wounds on numerous areas of his left calf and again one on the left dorsal foot. The areas  on the right lateral calf have healed. The  cellulitis that I gave him doxycycline for last week is also resolved this was mostly on the left anterior calf just below the tibial tuberosity. His edema looks fairly well-controlled. He tells me he went to see his primary doctor today and had blood work ordered 2/11; once again he has several open areas on the left calf left tibial area. Most of these are small and appear to have healthy granulation. He does not have anything open on the right. The edema and control in his thighs is pretty good which is usually a good indication he has been using his pumps as requested. 2/18; he continues to have several small areas on the left calf and left tibial area. Most of these are small healthy granulation. We put him in his stocking on the right leg last week and he arrives with a superficial open area over the right upper tibia and a fairly large area on the right lateral tibia in similar condition. His edema control actually does not look too bad, he claims to be using his compression pumps twice a day 2/25. Continued small areas on the left calf and left tibial area. New areas especially on the right are identified just below the tibial tuberosity and on the right upper tibia itself. There are also areas of weeping edema fluid even without an obvious wound. He does not have a considerable degree of lymphedema but clearly there is more edema here than his skin can handle. He states he is using the pumps twice a day. We have an Unna boot on the right and 4 layer compression on the left. 3/3; he continues to have an area on the right lateral calf and right posterior calf just below the popliteal fossa. There is a fair amount of tenderness around the wound on the popliteal fossa but I did not see any evidence of cellulitis, could just be that the wrap came down and rubbed in this area. ooHe does not have an open area on the left leg however there is an area on the left dorsal foot at the base of the third  toe ooWe have been using silver alginate to all wound areas 3/10; he did not have an open area on his left leg last time he was here a week ago. T oday he arrives with a horizontal wound just below the tibial tuberosity and an area on the left lateral calf. He has intense erythema and tenderness in this area. The area is on the right lateral calf and right posterior calf better than last week. We have been using silver alginate as usual 3/18 - Patient returns with 3 small open areas on left calf, and 1 small open area on right calf, the skin looks ok with no significant erythema, he continues the UNA boot on right and 4 layer compression on left. The right lateral calf wound is closed , the right posterior is small area. we will continue silver alginate to the areas. 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 He is been tolerating the dressing changes without complication. Fortunately she has made excellent improvement in regard to the overall status of his wounds. Unfortunately every time we cease wrapping him he ends up reopening in causing more significant issues at that  point. Again I'm unsure of the best direction to take although I think the lymphedema clinic may be appropriate for him. 02/03/19 on evaluation today patient appears to be doing well in regard to the wounds that we saw him for last week unfortunately he has a new area on the proximal portion of his right medial/posterior lower extremity where the wrap somewhat slowed down and caused swelling and a blister to rub and open. Unfortunately this is the only opening that he has on either leg at this point. 02/17/19 on evaluation today patient's bilateral lower extremities appear to be doing well. He still completely healed in regard to the left  lower extremity. In regard to the right lower extremity the area where the wrap and slid down and caused the blister still seems to be slightly open although this is dramatically better than during the last evaluation two weeks ago. I'm very pleased with the way this stands overall. 03/03/19 on evaluation today patient appears to be doing well in regard to his right lower extremity in general although he did have a new blister open this does not appear to be showing any evidence of active infection at this time. Fortunately there's No fevers, chills, nausea, or vomiting noted at this time. Overall I feel like he is making good progress it does feel like that the right leg will we perform the [employer] seems to do with a bit better than three layer wrap on the left which slid down on him. We may switch to doing bilateral in the book wraps. 5/4; I have not seen Mr. Giacomo in quite some time. According to our case manager he did not have an open wound on his left leg last week. He had 1 remaining wound on the right posterior medial calf. He arrives today with multiple openings on the left leg probably were blisters and/or wrap injuries from Unna boots. I do not think the Unna boot's will provide adequate compression on the left. I am also not clear about the frequency he is using the compression pumps. 03/17/19 on evaluation today patient appears to be doing excellent in regard to his lower extremities compared to last week's evaluation apparently. He had gotten significantly worse last week which is unfortunate. The [employer] wrap on the left did not seem to do very well for him at all and in fact it didn't control his swelling significantly enough he had an additional outbreak. Subsequently we go back to the four layer compression wrap on the left. This is good news. At least in that he is doing better and the wound seem to be killing him. He still has not heard anything from the lymphedema  clinic. 03/24/19 on evaluation today patient actually appears to be doing much better in regard to his bilateral lower Trinity as compared to last week when I saw him. Fortunately there's no signs of active infection at this time. He has been tolerating the dressing changes without complication. Overall I'm extremely pleased with the progress and appearance in general. 04/07/19 on evaluation today patient appears to be doing well in regard to his bilateral lower extremities. His swelling is significantly down from where it was previous. With that being said he does have a couple blisters still open at this point but fortunately nothing that seems to be too severe and again the majority of the larger openings has healed at this time. 04/14/19 on evaluation today patient actually appears to be doing quite well in regard  to his bilateral lower extremities in fact I'm not even sure there's anything significantly open at this time at any site. Nonetheless he did have some trouble with these wraps where they are somewhat irritating him secondary to the fact that he has noted that the graph wasn't too close down to the end of this foot in a little bit short as well up to his knee. Otherwise things seem to be doing quite well. 04/21/19 upon evaluation today patient's wound bed actually showed evidence of being completely healed in regard to both lower extremities which is excellent news. There does not appear to be any signs of active infection which is also good news. I'm very pleased in this regard. No fevers, chills, nausea, or vomiting noted at this time. 04/28/19 on evaluation today patient appears to be doing a little bit worse in regard to both lower extremities on the left mainly due to the fact that when he went infection disease the wrap was not wrapped quite high enough he developed a blister above this. On the right he is a small open area of nothing too significant but again this is continuing to give  him some trouble he has been were in the Velcro compression that he has at home. 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 He's been tolerating the dressing changes without complication. Fortunately there's no signs of active infection at this time. No fevers, chills, nausea, or vomiting noted at this time. We have been trying to get an appointment with her lymphedema clinic in [facility] but unfortunately nobody can get them on phone with not been able to even fax information over the patient likewise is not been able to get in touch with them. Overall I'm not sure exactly what's going on here with to reach out again today. 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 Still having a lot of drainage unfortunately. He tells me especially in the left but even on the right. There's no signs of active infection which is good news we've been using so ratcheted up to this point. 05/19/19 on evaluation today patient actually appears to be doing quite well with regard to his left lower extremity which is great news. Fortunately in regard to the right lower extremity has an issues with his wrap and he subsequently did remove this from what I'm understanding. Nonetheless long story short is what he had rewrapped once he removed it subsequently had maggots underneath this wrap whenever he came in for evaluation today. With that being said they were obviously completely cleaned away by the nursing staff. The visit today which is excellent news. However he does appear to potentially have some infection around the right ankle region where the maggots were located as well. He will likely require anabiotic therapy today. 05/26/19 on evaluation today patient actually appears to be doing much better in regard to his bilateral lower extremities. I feel like the  infection is under much better control. With that being said there were maggots noted when the wrap was removed yet again today. Again this could have potentially been left over from previous although at this time there does not appear to be any signs of significant drainage there was obviously on the wrap some drainage as well this contracted gnats or otherwise. Either way I do not see anything that appears to be  doing worse in my pinion and in fact I think his drainage has slowed down quite significantly likely mainly due to the fact to his infection being under better control. 06/02/2019 on evaluation today patient actually appears to be doing well with regard to his bilateral lower extremities there is no signs of active infection at this time which is great news. With that being said he does have several open areas more so on the right than the left but nonetheless these are all significantly better than previously noted. 06/09/2019 on evaluation today patient actually appears to be doing well. His wrap stayed up and he did not cause any problems he had more drainage on the right compared to the left but overall I do not see any major issues at this time which is great news. 06/16/2019 on evaluation today patient appears to be doing excellent with regard to his lower extremities the only area that is open is a new blister that can have opened as of today on the medial ankle on the left. Other than this he really seems to be doing great I see no major issues at this point. 06/23/2019 on evaluation today patient appears to be doing quite well with regard to his bilateral lower extremities. In fact he actually appears to be almost completely healed there is a small area of weeping noted of the right lower extremity just above the ankle. Nonetheless fortunately there is no signs of active infection at this time which is good news. No fevers, chills, nausea, vomiting, or diarrhea. 8/24; the patient arrived  for a nurse visit today but complained of very significant pain in the left leg and therefore I was asked to look at this. Noted that he did not have an open area on the left leg last week nevertheless this was wrapped. The patient states that he is not been able to put his compression pumps on the left leg because of the discomfort. He has not been systemically unwell 06/30/2019 on evaluation today patient unfortunately despite being excellent last week is doing much worse with regard to his left lower extremity today. In fact he had to come in for a nurse on Monday where his left leg had to be rewrapped due to excessive weeping Dr. Leanord Hawking placed him on doxycycline at that point. Fortunately there is no signs of active infection Systemically at this time which is good news. 07/07/2019 in regard to the patient's wounds today he actually seems to be doing well with his right lower extremity there really is nothing open or draining at this point this is great news. Unfortunately the left lower extremity is given him additional trouble at this time. There does not appear to be any signs of active infection nonetheless he does have a lot of edema and swelling noted at this point as well as blistering all of which has led to a much more poor appearing leg at this time compared to where it was 2 weeks ago when it was almost completely healed. Obviously this is a little discouraging for the patient. He is try to contact the lymphedema clinic in [place] he has not been able to get through to them. 07/14/2019 on evaluation today patient actually appears to be doing slightly better with regard to his left lower extremity ulcers. Overall I do feel like at least at the top of the wrap that we have been placing this area has healed quite nicely and looks much better. The remainder of the leg  is showing signs of improvement. Unfortunately in the thigh area he still has an open region on the left and again on the  right he has been utilizing just a Band-Aid on an area that also opened on the thigh. Again this is an area that were not able to wrap although we did do an Ace wrap to provide some compression that something that obviously is a little less effective than the compression wraps we have been using on the lower portion of the leg. He does have an appointment with the lymphedema clinic in [facility] on Friday. 07/21/2019 on evaluation today patient appears to be doing better with regard to his lower extremity ulcers. He has been tolerating the dressing changes without complication. Fortunately there is no signs of active infection at this time. No fevers, chills, nausea, vomiting, or diarrhea. I did receive the paperwork from the physical therapist at the lymphedema clinic in [place]. Subsequently I signed off on that this morning and sent that back to him for further progression with the treatment plan. 07/28/2019 on evaluation today patient appears to be doing very well with regard to his right lower extremity where I do not see any open wounds at this point. Fortunately he is feeling great as far as that is concerned as well. In regard to the left lower extremity he has been having issues with still several areas of weeping and edema although the upper leg is doing better his lower leg still I think is going require the compression wrap at this time. No fevers, chills, nausea, vomiting, or diarrhea. 08/04/2019 on evaluation today patient unfortunately is having new wounds on the right lower extremity. Again we have been using Unna boot wrap on that side. We switched him to using his juxta lite wrap at home. With that being said he tells me he has been using it although his legs extremely swollen and to be honest really does not appear that he has been. I cannot know that for sure however. Nonetheless he has multiple new wounds on the right lower extremity at this time. Obviously we will have to  see about getting this rewrapped for him today. 08/11/2019 on evaluation today patient appears to be doing fairly well with regard to his wounds. He has been tolerating the dressing changes including the compression wraps without complication. He still has a lot of edema in his upper thigh regions bilaterally he is supposed to be seeing the lymphedema clinic on the 15th of this month once his wraps arrive for the upper part of his legs. 08/18/2019 on evaluation today patient appears to be doing well with regard to his bilateral lower extremities at this point. He has been tolerating the dressing changes without complication. Fortunately there is no signs of active infection which is also good news. He does have a couple weeping areas on the first and second toe of the right foot he also has just a small area on the left foot upper leg and a small area on the left lower leg but overall he is doing quite well in my opinion. He is supposed to be getting his wraps shortly in fact tomorrow and then subsequently is seeing the lymphedema clinic next Wednesday on the 21st. Of note he is also leaving on the 25th to go on vacation for a week to the beach. For that reason and since there is some uncertainty about what there can be doing at lymphedema clinic next Wednesday I am get a make  an appointment for next Friday here for [place] to see what we need to do for him prior to him leaving for vacation. 10/23; patient arrives in considerable pain predominantly in the upper posterior calf just distal to the popliteal fossa also in the wound anteriorly above the major wound. This is probably cellulitis and he has had this recurrently in the past. He has no open wound on the right side and he has had an Radio broadcast assistant in that area. Finally I note that he has an area on the left posterior calf which by enlarge is mostly epithelialized. This protrudes beyond the borders of the surrounding skin in the setting of dry scaly skin and  lymphedema. The patient is leaving for [facility] on Sunday. Per his longstanding pattern, he will not take his compression pumps with him predominantly out of fear that they will be stolen. He therefore asked that we put a Unna boot back on the right leg. He will also contact the wound care center in [facility] to see if they can change his dressing in the mid week. 11/3; patient returned from his vacation to [facility]. He was seen on 1 occasion at their wound care center. They did a 2 layer compression system as they did not have our 4-layer wrap. I am not completely certain what they put on the wounds. They did not change the Unna boot on the right. The patient is also seeing a lymphedema specialist physical therapist in [place]. It appears that he has some compression sleeve for his thighs which indeed look quite a bit better than I am used to seeing. He pumps over these with his external compression pumps. 11/10; the patient has a new wound on the right medial thigh otherwise there is no open areas on the right. He has an area on the left leg posteriorly anteriorly and medially and an area over the left second toe. We have been using silver alginate. He thinks the injury on his thigh is secondary to friction from the compression sleeve he has. 11/17; the patient has a new wound on the right medial thigh last week. He thinks this is because he did not have a underlying stocking for his thigh juxta lite apparatus. He now has this. The area is fairly large and somewhat angry but I do not think he has underlying cellulitis. ooHe has a intact blister on the right anterior tibial area. ooSmall wound on the right great toe dorsally ooSmall area on the medial left calf. 11/30; the patient does not have any open areas on his right leg and we did not take his juxta lite stocking off. However he states that on Friday his compression wrap fell down lodging around his upper mid calf area. As  usual this creates a lot of problems for him. He called urgently today to be seen for a nurse visit however the nurse visit turned into a provider visit because of extreme erythema and pain in the left anterior tibia extending laterally and posteriorly. The area that is problematic is extensive 10/06/2019 upon evaluation today patient actually appears to be doing poorly in regard to his left lower extremity. He Dr. Leanord Hawking did place him on doxycycline this past Monday apparently due to the fact that he was doing much worse in regard to this left leg. Fortunately the doxycycline does seem to be helping. Unfortunately we are still having a very difficult time getting his edema under any type of control in order to  anticipate discharge at some point. The only way were really able to control his lymphedema really is with compression wraps and that has only even seemingly temporary. He has been seeing a lymphedema clinic they are trying to help in this regard but still this has been somewhat frustrating in general for the patient. 10/13/19 on evaluation today patient appears to be doing excellent with regard to his right lower extremity as far as the wounds are concerned. His swelling is still quite extensive unfortunately. He is still having a lot of drainage from the thigh areas bilaterally which is unfortunate. He's been going to lymphedema clinic but again he still really does not have this edema under control as far as his lower extremities are concern. With regard to his left lower extremity this seems to be improving and I do believe the doxycycline has been of benefit for him. He is about to complete the doxycycline. 10/20/2019 on evaluation today patient appears to be doing poorly in regard to his bilateral lower extremities. More in the right thigh he has a lot of irritation at this site unfortunately. In regard to the left lower extremity the wrap was not quite as high it appears and does seem to  have caused him some trouble as well. Fortunately there is no evidence of systemic infection though he does have some blue-green drainage which has me concerned for the possibility of Pseudomonas. He tells me he is previously taking Cipro without complications and he really does not care for Levaquin however due to some of the side effects he has. He is not allergic to any medications specifically antibiotics that were aware of. 10/27/2019 on evaluation today patient actually does appear to be for the most part doing better when compared to last week's evaluation. With that being said he still has multiple open wounds over the bilateral lower extremities. He actually forgot to start taking the Cipro and states that he still has the whole bottle. He does have several new blisters on left lower extremity today I think I would recommend he go ahead and take the Cipro based on what I am seeing at this point. 12/30-Patient comes at 1 week visit, 4 layer compression wraps on the left and Unna boot on the right, primary dressing Xtrasorb and silver alginate. Patient is taking his Cipro and has a few more days left probably 5-6, and the legs are doing better. He states he is using his compressions devices which I believe he has 11/10/2019 on evaluation today patient actually appears to be much better than last time I saw him 2 weeks ago. His wounds are significantly improved and overall I am very pleased in this regard. Fortunately there is no signs of active infection at this time. He is just a couple of days away from completing Cipro. Overall his edema is much better he has been using his lymphedema pumps which I think is also helping at this point. 11/17/2019 on evaluation today patient appears to be doing excellent in regard to his wounds in general. His legs are swollen but not nearly as much as they have been in the past. Fortunately he is tolerating the compression wraps without complication. No fevers,  chills, nausea, vomiting, or diarrhea. He does have some erythema however in the distal portion of his right lower extremity specifically around the forefoot and toes there is a little bit of warmth here as well. 11/24/2019 on evaluation today patient appears to be doing well with regard to his  right lower extremity I really do not see any open wounds at this point. His left lower extremity does have several open areas and his right medial thigh also is open. Other than this however overall the patient seems to be making good progress and I am very pleased at this point. 12/01/2019 on evaluation today patient appears to be doing poorly at this point in regard to his left lower extremity has several new blisters despite the fact that we have him in compression wraps. In fact he had a 4-layer compression wrap, his upper thigh wrapped from lymphedema clinic, and a juxta light over top of the 4 layer compression wrap the lymphedema clinic applied and despite all this he still develop blisters underneath. Obviously this does have me concerned about the fact that unfortunately despite what we are doing to try to get wounds healed he continues to have new areas arise I do not think he is ever good to be at the point where he can realistically just use wraps at home to keep things under control. Typically when we heal him it takes about 1-2 days before he is back in the clinic with severe breakdown and blistering of his lower extremities bilaterally. This is happened numerous times in the past. Unfortunately I think that we may need some help as far as overall fluid overload to kind of limit what we are seeing and get things under better control. 12/08/2019 on evaluation today patient presents for follow-up concerning his ongoing bilateral lower extremity edema. Unfortunately he is still having quite a bit of swelling the compression wraps are controlling this to some degree but he did see Dr. [name] his  cardiologist I do have that available for review today as far as the appointment was concerned that was on 12/06/2019. Obviously that she has been 2 days ago. The patient states that he is only been taking the Lasix 80 mg 1 time a day he had told me previously he was taking this twice a day. Nonetheless Dr. [name] recommended this be up to 80 mg 2 times a day for the patient as he did appear to be fluid overloaded. With that being said the patient states he did this yesterday and he was unable to go anywhere or do anything due to the fact that he was constantly having to urinate. Nonetheless I think that this is still good to be something that is important for him as far as trying to get his edema under control at all things that he is going to be able to just expect his wounds to get under control and things to be better without going through at least a period of time where he is trying to stabilize his fluid management in general and I think increasing the Lasix is likely the first step here. It was also mentioned the possibility that the patient may require metolazone. With that being said he wanted to have the patient take Lasix twice a day first and then reevaluating 2 months to see where things stand. 12/15/2019 upon evaluation today patient appears to be doing regard to his legs although his toes are showing some signs of weeping especially on the left at this point to some degree on the right. There does not appear to be any signs of active infection and overall I do feel like the compression wraps are doing well for him but he has not been able to take the Lasix at home and the increased dose that Dr.  Hilty recommended. He tells me that just not go to be feasible for him. Nonetheless I think in this case he should probably send a message to Dr. [name] in order to discuss options from the standpoint of possible admission to get the fluid off or otherwise going forward. 12/22/2019 upon evaluation  today patient appears to be doing fairly well with regard to his lower extremities at this point. In fact he would be doing excellent if it was not for the fact that his right anterior thigh apparently had an allergic reaction to adhesive tape that he used. The wound itself that we have been monitoring actually appears to be healed. There is a lot of irritation at this point. 12/29/2019 upon evaluation today patient appears to be doing well in regard to his lower extremities. His left medial thigh is open and somewhat draining today but this is the only region that is open the right has done much better with the treatment utilizing the steroid cream that I prescribed for him last week. Overall I am pleased in that regard. Fortunately there is no signs of active infection at this time. No fevers, chills, nausea, vomiting, or diarrhea. 01/05/2020 upon evaluation today patient appears to be doing more poorly in regard to his right lower extremity at this point upon evaluation today. Unfortunately he continues to have issues in this regard and I think the biggest issue is controlling his edema. This obviously is not very well controlled at this point is been recommended that he use the Lasix twice a day but he has not been able to do that. Unfortunately I think this is leading to an issue where honestly he is not really able to effectively control his edema and therefore the wounds really are not doing significantly better. I do not think that he is going to be able to keep things under good control unless he is able to control his edema much better. I discussed this again in great detail with him today. 01/12/2020 good news is patient actually appears to be doing quite well today at this point. He does have an appointment with lymphedema clinic tomorrow. His legs appear healed and the toe on the left is almost completely healed. In general I am very pleased with how things stand at this point. 01/19/2020 upon  evaluation today patient appears to actually be doing well in regard to his lower extremities there is nothing open at this point. Fortunately he has done extremely well more recently. Has been seeing lymphedema clinic as well. With that being said he has Velcro wraps for his lower legs as well as his upper legs. The only wound really is on his toe which is the right great toe and this is barely anything even there. With all that being said I think it is good to be appropriate today to go ahead and switch him over to the Velcro compression wraps. 01/26/2020 upon evaluation today patient appears to be doing worse with regard to his lower extremities after last week switch him to Velcro compression wraps. Unfortunately he lasted less than 24 hours he did not have the sock portion of his Velcro wrap on the left leg and subsequently developed a blister underneath the Velcro portion. Obviously this is not good and not what we were looking for at this point. He states the lymphedema clinic did tell him to wear the wrap for 23 hours and take him off for 1 I am okay with that plan but again  right now we got a get things back under control again he may have some cellulitis noted as well. 02/02/2020 upon evaluation today patient unfortunately appears to have several areas of blistering on his bilateral lower extremities today mainly on the feet. His legs do seem to be doing somewhat better which is good news. Fortunately there is no evidence of active infection at this time. No fevers, chills, nausea, vomiting, or diarrhea. 02/16/2020 upon evaluation today patient appears to be doing well at this time with regard to his legs. He has a couple weeping areas on his toes but for the most part everything is doing better and does appear to be sealed up on his legs which is excellent news. We can continue with wrapping him at this point as he had every time we discontinue the wraps he just breaks out with new wounds.  There is really no point in is going forward with this at this point. 03/08/2020 upon evaluation today patient actually appears to be doing quite well with regard to his lower extremity ulcers. He has just a very superficial and really almost nonexistent blister on the left lower extremity he has in general done very well with the compression wraps. With that being said I do not see any signs of infection at this time which is good news. 03/29/2020 upon evaluation today patient appears to be doing well with regard to his wounds currently except for where he had several new areas that opened up due to some of the wrap slipping and causing him trouble. He states he did not realize they had slipped. Nonetheless he has a 1 area on the right and 3 new areas on the left. Fortunately there is no signs of active infection at this time which is great news. 04/05/2020 upon evaluation today patient actually appears to be doing quite well in general in regard to his legs currently. Fortunately there is no signs of active infection at this time. No fevers, chills, nausea, vomiting, or diarrhea. He tells me next week that he will actually be seen in the lymphedema clinic on Thursday at 10 AM I see him on Wednesday next week. 04/12/2020 upon evaluation today patient appears to be doing very well with regard to his lower extremities bilaterally. In fact he does not appear to have any open wounds at this point which is good news. Fortunately there is no signs of active infection at this time. No fevers, chills, nausea, vomiting, or diarrhea. 04/19/2020 upon evaluation today patient appears to be doing well with regard to his wounds currently on the bilateral lower extremities. There does not appear to be any signs of active infection at this time. Fortunately there is no evidence of systemic infection and overall very pleased at this point. Nonetheless after I held him out last week he literally had blisters the next morning  already which swelled up with him being right back here in the clinic. Overall I think that he is just not can be able to be discharged with his legs the way they are he is much to volume overloaded as far as fluid is concerned and that was discussed with him today of also discussed this but should try the clinic nurse manager as well as Dr. Leanord Hawking. 04/26/2020 upon evaluation today patient appears to be doing better with regard to his wounds currently. He is making some progress and overall swelling is under good control with the compression wraps. Fortunately there is no evidence of active infection  at this time. 05/10/2020 on evaluation today patient appears to be doing overall well in regard to his lower extremities bilaterally. He is Tolerating the compression wraps without complication and with what we are seeing currently I feel like that he is making excellent progress. There is no signs of active infection at this time. 05/24/2020 upon evaluation today patient appears to be doing well in regard to his legs. The swelling is actually quite a bit down compared to where it has been in the past. Fortunately there is no sign of active infection at this time which is also good news. With that being said he does have several wounds on his toes that have opened up at this point. 05/31/2020 upon evaluation today patient appears to be doing well with regard to his legs bilaterally where he really has no significant fluid buildup at this point overall he seems to be doing quite well. Very pleased in this regard. With regard to his toes these also seem to be drying up which is excellent. We have continue to wrap him as every time we tried as a transition to the juxta light wraps things just do not seem to get any better. 06/07/2020 upon evaluation today patient appears to be doing well with regard to his right leg at this point. Unfortunately left leg has a lot of blistering he tells me the wrap started to slide  down on him when he tried to put his other Velcro wrap over top of it to help keep things in order but nonetheless still had some issues. 06/14/2020 on evaluation today patient appears to be doing well with regard to his lower extremity ulcers and foot ulcers at this point. I feel like everything is actually showing signs of improvement which is great news overall there is no signs of active infection at this time. No fevers, chills, nausea, vomiting, or diarrhea. 06/21/2020 on evaluation today patient actually appears to be doing okay in regard to his wounds in general. With that being said the biggest issue I see is on his right foot in particular the first and second toe seem to be doing a little worse due to the fact this is staying very wet. I think he is probably getting need to change out his dressings a couple times in between each week when we see him in regard to his toes in order to keep this drier based on the location and how this is proceeding. 06/28/2020 on evaluation today patient appears to be doing a little bit more poorly overall in regard to the appearance of the skin I am actually somewhat concerned about the possibility of him having a little bit of an infection here. We discussed the course of potentially giving him a doxycycline prescription which he is taken previously with good result. With that being said I do believe that this is potentially mild and at this point easily fixed. I just do not want anything to get any worse. 07/12/2020 upon evaluation today patient actually appears to be making some progress with regard to his legs which is great news there does not appear to be any evidence of active infection. Overall very pleased with where things stand. 07/26/2020 upon evaluation today patient appears to be doing well with regard to his leg ulcers and toe ulcers at this point. He has been tolerating the compression wraps without complication overall very pleased in this  regard. 08/09/2020 upon evaluation today patient appears to be doing well with regard to  his lower extremities bilaterally. Fortunately there is no signs of active infection overall I am pleased with where things stand. 08/23/2020 on evaluation today patient appears to be doing well with regard to his wound. He has been tolerating the dressing changes without complication. Fortunately there is no signs of active infection at this time. Overall his legs seem to be doing quite well which is great news and I am very pleased in that regard. No fevers, chills, nausea, vomiting, or diarrhea. 09/13/2020 upon evaluation today patient appears to be doing okay in regard to his lower extremities. He does have a fairly large blister on the right leg which I did remove the blister tissue from today so we can get this to dry out other than that however he seems to be doing quite well. There is no signs of active infection at this time. 09/27/2020 upon evaluation today patient appears to actually be doing some better in regard to his right leg. Fortunately signs of active infection at this time which is great news. No fevers, chills, nausea, vomiting, or diarrhea. 10/04/2020 upon evaluation today patient actually appears to be showing signs of improvement which is great news with regard to his leg ulcers. Fortunately there is no signs of active infection which is great news he is still taking the antibiotics currently. No fevers, chills, nausea, vomiting, or diarrhea. 10/18/2020 on evaluation today patient appears to be doing well with regard to his legs currently. He has been tolerating the dressing changes including the wraps without complication. Fortunately there is no signs of active infection at this time. No fevers, chills, nausea, vomiting, or diarrhea. 10/25/2020 upon evaluation today patient appears to be doing decently well in regard to his wounds currently. He has been tolerating the dressing  changes without complication. Overall I feel like he is making good progress albeit slow. Again this is something we can have to continue to wrap for some time to come most likely. 11/08/2020 upon evaluation today patient appears to be doing well with regard to his wounds currently. He has been tolerating the dressing changes without complication is not currently on any antibiotics and he does not appear to show any signs of infection. He does continue to have a lot of drainage on the right leg not too severe but nonetheless this is very scattered. On the left leg this is looking to be much improved overall. 11/15/2020 upon evaluation today patient appears to be doing better with regard to his legs bilaterally. Especially the right leg which was much more significant last week. There does not appear to be any signs of active infection which is great news. No fevers, chills, nausea, vomiting, or diarrhea. 11/23/2019 upon evaluation today patient appears to be doing poorly still in regard to his lower extremities bilaterally. Unfortunately his right leg in particular appears to be doing much more poorly there is no signs really of infection this is not warm to touch but he does have a lot of drainage and weeping unfortunately. With that reason I do believe that we may need to initiate some treatment here to try to help calm down some of the swelling of the right leg. I think switching to a 4-layer compression wrap would be beneficial here. The patient is in agreement with giving this a try. 11/29/2020 upon evaluation today patient appears to be doing well currently in regard to his leg ulcers. I feel like the right leg is doing better he still has a lot of drainage  but we do see some improvement here. The 4-layer compression wrap I think was helpful. 12/06/2020 upon evaluation today patient appears to be doing well with regard to his legs. In fact they seem to be doing about the best I have seen up to  this point. Fortunately there is no signs of active infection at this time. No fevers, chills, nausea, vomiting, or diarrhea. 12/20/2020 upon evaluation today patient appears to be doing well at this time in regard to his legs. He is not having any significant draining which is great news. Fortunately there is no signs of active infection at this time. No fevers, chills, nausea, vomiting, or diarrhea. 01/17/2021 upon evaluation today [place] actually appears to be doing excellent in regard to his legs. He has a few areas again that come and go as far as his toes are concerned but overall this is doing quite well. 01/31/2021 upon evaluation today patient appears to be doing well with regard to his legs. Fortunately there does not appear to be any signs of active infection which is great news. Overall he is still having significant edema despite the compression wraps basically the 4-layer compression wrap to just keep things under control there is really not much room for play. 4/13: Mr. Halter is a longstanding patient in our clinic and benefits greatly from weekly compression wraps. Today he has no complaints. He has been tolerating the wraps well. He states he is using the lymphedema pumps at home. 5/4; patient presents for follow-up of his chronic lymphedema/venous insufficiency ulcers. He comes weekly for compression wraps. He has no complaints today. He was unable to tolerate the Coflex 2 layer Last week so we will do the four press 4-layer compression. He has been using his lymphedema pumps daily. 5/18; patient presents for 2-week follow-up. He has no complaints or issues today. He has developed a new wound to the right foot on his fourth toe. He overall feels well and denies signs of infection. 6/1; patient presents for 2-week follow-up. He has no complaints or issues today. He denies signs of infection. 04/18/2021 upon evaluation today patient appears to be doing well with regard to his legs  bilaterally. Family open wound is actually on the toe of his left foot everything else is completely closed which is great news. In general I am extremely pleased with where things stand at this point. The patient is also happy that things are doing so well. 05/02/2021 upon evaluation today patient's legs actually appear to be doing quite well today. Fortunately there does not appear to be any signs of active infection which is great and overall I am extremely pleased with where he stands today. The patient does not appear to have any evidence of active infection at this time which is also great news. 05/09/2021 upon evaluation today patient appears to be doing a little bit more poorly in regard to his legs. Unfortunately he is having issues with some breakdown and a blood blister on the left leg this is due to I believe honestly to how it was wrapped last week. Fortunately there does not appear to be any signs of infection but nonetheless this is still a concern to be honest. No fevers, chills, nausea, vomiting, or diarrhea. 05/16/2021 upon evaluation today patient appears to be doing significantly better as compared to last week. I am very pleased with where things stand today. There does not appear to be any signs of infection which is great news and overall very pleased with  where we stand. No fevers, chills, nausea, vomiting, or diarrhea. 05/30/2021 upon evaluation today patient appears to be doing well with regard to his legs. He has been tolerating the dressing changes without complication. Fortunately there does not appear to be any signs of active infection which is great news and overall I am extremely pleased with where things stand today. No fevers, chills, nausea, vomiting, or diarrhea. 06/20/2021 upon evaluation today patient actually appears to be making good progress today and very pleased with what we are seeing. I think his legs are really maintaining. As long as we continue wrapping he  seems to be doing excellent in my opinion. Fortunately there is no signs of active infection at this time. No fevers, chills, nausea, vomiting, or diarrhea. 07/11/2021 upon evaluation today patient actually appears to be making excellent progress at this time. Fortunately there does not appear to be any evidence of active infection which is great news and overall I am extremely pleased with where things stand today. No fevers, chills, nausea, vomiting, or diarrhea. 07/25/2021 upon evaluation today patient appears to be doing well currently in regard to his lower extremities. He has been making good progress here and I do not see anything that is actually open significantly today this is great news. No fevers, chills, nausea, vomiting, or diarrhea. 08/08/2021 upon evaluation today patient appears to be doing well with regard to his wound. He has been tolerating the dressing changes without complication. With that being said unfortunately has a new area that opened up as far as his right posterior leg is concerned this was a blister he also has an area on the third toe right foot which also reopen. Fortunately there is no signs of active infection at this time which is great news. No fevers, chills, nausea, vomiting, or diarrhea. 10/17; patient came in today at his request initially for a nurse visit because it but out of concern for deterioration in both his lower legs and cellulitis I was asked to look at him. He comes in with increased swelling which he says started over the weekend he started to notice pain as well in his left medial ankle, right knee, left knee left dorsal foot. His wraps fell down contributing to some of this but he has not been using his compression pumps over the weekend for reasons that are not really clear. He comes in with multiple new wounds including the right posterior leg, right third toe, right fourth toe, left second toe left medial ankle left dorsal ankle and right  anterior lower leg 09/19/2021 upon evaluation today patient does seem to be making improvements in general which is great news. I do not see any evidence of infection currently he does have some hypergranulation of the anterior portion of his ankle on the left side this is going require some debridement to pare this down and then subsequently silver nitrate probably due to the amount of bleeding that he is probably going to experience. He is in agreement with this plan however. 09/26/2021 upon evaluation today although the patient's legs appear to be doing okay today unfortunately he did have maggots noted during the evaluation as well. This is again quite unfortunate with the respect to the fact that he feels like that he got the wrap wet which was noted today he is not sure when and I think this is what led to the issue. Nonetheless we can try to see what we can do about silly things off so that this  does not happen again in the future although he is can have to be diligent about taking care of his wraps as well. 10/03/2021 upon evaluation today patient appears to be doing okay in regard to his legs currently. He unfortunately has maggots again noted on the left foot. This obviously is becoming quite an issue to be perfectly honest. I do think that he needs to see what can be done at home to try to limit this exposure. Last week we had cleaned everything away so this is a new infestation as far as that is concerned. There is really not much that I can do to combat that I am trying to do what we can here in the clinic to wrap his foot and try to prevent any access of that again with knots there is a small this becomes very difficult. 10/10/2021 upon evaluation today patient appears to be doing poorly in regard to his legs he is very swollen and unfortunately I think a big part of the issue here is simply that he is not taking his fluid pills appropriately stressed probably is not helping much at all  either. He still having a lot of issues as far as getting moved and having to be out of his current living condition. With that being said he does have of note maggots noted on the right foot this time completely separate from what we have been seeing he tells me that he got his wraps wet again. Its mainly when it rains it sounds like that he goes out to his car in the area where he has to park there is a area that collects a fairly large puddle according to what he tells me today either way I explained to him which we have done before as well that if he gets his wraps wet he needs to let us know so we can get them changed out. He does not need to keep them on wet. 10/17/2021 upon evaluation today patient appears to be doing well in regard to his legs. In fact things are significantly better compared to where they were previous. Fortunately I see no evidence of infection currently which is great news. No fevers, chills, nausea, vomiting, or diarrhea. 10/24/2021 upon evaluation today patient appears to be doing awesome in regard to his legs in general. I think everything is showing signs of significant improvement which is great news. There is really only 1 opening on the second toe left foot everything else is closed. 11/07/2020 upon evaluation today patient appears to be doing well with regard to his legs in general he does have a blister on the left foot but otherwise things are doing overall quite well. 11/28/2021 upon evaluation today patient's legs appear to be doing decently well. The biggest issue we see is that he does have some blistering where the wrap seems to have slid down on the right leg near the top. This is just below the knee. That is good to be the biggest issue going forward at this point in my opinion. Otherwise I really feel like he is doing pretty well across the board with the other wound locations previously noted. 12/05/2021 upon evaluation today patient appears to be doing well  with regard to the majority of his wounds in fact he has 1 area healed. Unfortunately he has a new area that broken down over the anterior portion of his left ankle. This is quite deep and draining quite a bit. Fortunately I do not see  any signs of infection here though on the lateral portion of his ankle there is some erythema and warmth to touch no drainage nothing really to culture but I am concerned about cellulitis starting up here. 12/12/2021 upon evaluation today patient appears to be doing significantly better in regard to his leg ulcers. Everything is improved as compared to the last time I saw him. I am actually very pleased with where we stand today. The patient is not having any significant pain which is great news. Objective Constitutional Obese and well-hydrated in no acute distress. Vitals Time Taken: 10:48 AM, Height: 70 in, Weight: 380.2 lbs, BMI: 54.5, Temperature: 98.0 F, Pulse: 83 bpm, Respiratory Rate: 22 breaths/min, Blood Pressure: 138/68 mmHg, Capillary Blood Glucose: 187 mg/dl. Respiratory normal breathing without difficulty. Psychiatric this patient is able to make decisions and demonstrates good insight into disease process. Alert and Oriented x 3. pleasant and cooperative. General Notes: Upon inspection patient's wound bed showed signs of good granulation and epithelization at this point. Fortunately I do not see any evidence of active infection locally or systemically which is great news and overall I think that the patient is making excellent progress here. Postdebridement I did clear away some of the necrotic debris over the surface of the wound on the left anterior ankle region. He tolerated that without complication postdebridement wound bed is significantly improved. Integumentary (Hair, Skin) Wound #204 status is Open. Original cause of wound was Gradually Appeared. The date acquired was: 09/26/2021. The wound has been in treatment 11 weeks. The wound is  located on the Left T Second. The wound measures 0.3cm length x 0.3cm width x 0.1cm depth; 0.071cm^2 area and 0.007cm^3 volume. There oe is Fat Layer (Subcutaneous Tissue) exposed. There is no tunneling or undermining noted. There is a medium amount of serous drainage noted. The wound margin is distinct with the outline attached to the wound base. There is small (1-33%) pink granulation within the wound bed. There is no necrotic tissue within the wound bed. Wound #211 status is Open. Original cause of wound was Gradually Appeared. The date acquired was: 12/05/2021. The wound has been in treatment 1 weeks. The wound is located on the Left,Anterior Ankle. The wound measures 1.5cm length x 3.9cm width x 0.2cm depth; 4.595cm^2 area and 0.919cm^3 volume. There is a medium amount of serosanguineous drainage noted. Wound #212 status is Open. Original cause of wound was Blister. The date acquired was: 12/05/2021. The wound has been in treatment 1 weeks. The wound is located on the Right,Lateral Lower Leg. The wound measures 0.1cm length x 0.1cm width x 0.1cm depth; 0.008cm^2 area and 0.001cm^3 volume. There is Fat Layer (Subcutaneous Tissue) exposed. There is no tunneling or undermining noted. There is a medium amount of serous drainage noted. The wound margin is flat and intact. There is large (67-100%) red, pink granulation within the wound bed. There is a small (1-33%) amount of necrotic tissue within the wound bed including Adherent Slough. Wound #213 status is Open. Original cause of wound was Blister. The date acquired was: 12/05/2021. The wound has been in treatment 1 weeks. The wound is located on the Left,Posterior Lower Leg. The wound measures 0cm length x 0cm width x 0cm depth; 0cm^2 area and 0cm^3 volume. There is a medium amount of serous drainage noted. 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 other part of right foot with unspecified severity Non-pressure chronic ulcer of other part of right lower leg with fat layer exposed Chronic venous hypertension (idiopathic) with ulcer and inflammation of bilateral lower extremity Cellulitis of left lower limb Lymphedema, not elsewhere classified Type 2 diabetes mellitus with diabetic neuropathy, unspecified Type 2 diabetes mellitus with other skin ulcer Cellulitis of right lower limb Procedures Wound #211 Pre-procedure diagnosis of Wound #211 is a Diabetic Wound/Ulcer of the Lower Extremity located on the Left,Anterior Ankle .Severity of Tissue Pre Debridement is: Fat layer exposed. There was a Excisional Skin/Subcutaneous Tissue Debridement with a total area of 5.85 sq cm performed by Lenda Kelp, PA. With the following instrument(s): Curette to remove Viable and Non-Viable tissue/material. Material removed includes Subcutaneous Tissue, Slough, Skin: Dermis, Skin: Epidermis, and Fibrin/Exudate after achieving pain control using Other (benzocaine 20%). A time out was conducted at 11:10, prior to the start of the procedure. A Moderate amount of bleeding was controlled with Pressure. The procedure was tolerated well with a pain level of 0 throughout and a pain level of 0 following the procedure. Post Debridement Measurements: 1.5cm length x 3.9cm width x 0.2cm depth; 0.919cm^3 volume. Character of Wound/Ulcer Post Debridement is improved. Severity of Tissue Post Debridement is: Fat layer exposed. Post procedure Diagnosis Wound #211: Same as Pre-Procedure Pre-procedure diagnosis of Wound #211 is a Diabetic Wound/Ulcer of the Lower Extremity located on the Left,Anterior Ankle . There was a Four Layer Compression Therapy Procedure by Shawn Stall, RN. Post procedure Diagnosis Wound #211: Same as Pre-Procedure Wound #212 Pre-procedure diagnosis of Wound  #212 is a Lymphedema located on the Right,Lateral Lower Leg . There was a Four Layer Compression Therapy Procedure by Shawn Stall, RN. Post procedure Diagnosis Wound #212: Same as Pre-Procedure Plan Follow-up Appointments: Return Appointment in 1 week. - Wednesday Algis Liming, RN Room 1 Bathing/ Shower/ Hygiene: May shower with protection but do not get wound dressing(s) wet. Edema Control - Lymphedema / SCD / Other: Lymphedema Pumps. Use Lymphedema pumps on leg(s) 2-3 times a day for 45-60 minutes. If wearing any wraps or hose, do not remove them. Continue exercising as instructed. Elevate legs to the level of the heart or above for 30 minutes daily and/or when sitting, a frequency of: - throughout the day Avoid standing for long periods of time. Exercise regularly Other Edema Control Orders/Instructions: - right leg 4 layer compression with unna boot to secure to upper portion of lower leg. apply lotion to right leg. WOUND #204: - T Second Wound Laterality: Left oe Peri-Wound Care: Sween Lotion (Moisturizing lotion) 3 x Per Day/30 Days Discharge Instructions: Apply moisturizing lotion as directed Prim Dressing: KerraCel Ag Gelling Fiber Dressing, 2x2 in (silver alginate) 3 x Per Day/30 Days ary Discharge Instructions: Apply silver alginate to wound bed as instructed Secondary Dressing: Woven Gauze Sponge, Non-Sterile 4x4 in 3 x Per Day/30 Days Discharge Instructions: Apply over primary dressing as directed. Secured With: Coban Self-Adherent Wrap 4x5 (in/yd) 3 x Per Day/30 Days Discharge Instructions: Secure with Coban as directed. Secured With: Insurance underwriter, Sterile 2x75 (in/in) 3 x Per Day/30 Days Discharge Instructions: Secure with stretch gauze as directed. WOUND #211: - Ankle Wound Laterality: Left, Anterior Peri-Wound Care: Sween Lotion (Moisturizing lotion) 3 x Per Day/30 Days Discharge Instructions: Apply moisturizing lotion as directed Prim Dressing:  KerraCel Ag Gelling Fiber Dressing, 2x2 in (silver alginate) 3 x Per Day/30 Days ary Discharge Instructions: Apply silver alginate to wound bed  as instructed Secondary Dressing: Woven Gauze Sponge, Non-Sterile 4x4 in 3 x Per Day/30 Days Discharge Instructions: Apply over primary dressing as directed. Secondary Dressing: ABD Pad, 8x10 3 x Per Day/30 Days Discharge Instructions: Apply over primary dressing as directed. Com pression Wrap: FourPress (4 layer compression wrap) 3 x Per Day/30 Days Discharge Instructions: Apply four layer compression . unna layer at top to secure wrap WOUND #212: - Lower Leg Wound Laterality: Right, Lateral Peri-Wound Care: Sween Lotion (Moisturizing lotion) 3 x Per Day/30 Days Discharge Instructions: Apply moisturizing lotion as directed Prim Dressing: KerraCel Ag Gelling Fiber Dressing, 2x2 in (silver alginate) 3 x Per Day/30 Days ary Discharge Instructions: Apply silver alginate to wound bed as instructed Secondary Dressing: Woven Gauze Sponge, Non-Sterile 4x4 in 3 x Per Day/30 Days Discharge Instructions: Apply over primary dressing as directed. Secondary Dressing: ABD Pad, 8x10 3 x Per Day/30 Days Discharge Instructions: Apply over primary dressing as directed. Com pression Wrap: FourPress (4 layer compression wrap) 3 x Per Day/30 Days Discharge Instructions: Apply four layer compression . unna layer at top to secure wrap 1. Would recommend currently that we going to continue with the wound care measures as before and the patient is in agreement with plan. This includes the use of the silver alginate dressing to any open wound locations. 2. Also can recommend that we have the patient continue with the 4-layer compression wraps bilaterally which I think is doing an awesome job. 3. I would also suggest the patient should continue to take his fluid pills and elevate his legs much as possible. Obviously more this he can do the better. We will see patient back for  reevaluation in 1 week here in the clinic. If anything worsens or changes patient will contact our office for additional recommendations. Electronic Signature(s) Signed: 12/12/2021 1:01:21 PM By: Lenda KelpStone III, Deone Omahoney PA-C Entered By: Lenda KelpStone III, Virgina Deakins on 12/12/2021 13:01:20 -------------------------------------------------------------------------------- SuperBill Details Patient Name: Date of Service: [name], Arush J. 12/12/2021 Medical Record Number: [id number] Patient Account Number: [id number] Date of Birth/Sex: Treating RN: 09/19/1951 (71 y.o. 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 L97.812 Non-pressure chronic ulcer of other part of right lower leg with fat layer exposed I87.333 Chronic venous hypertension (idiopathic) 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 L03.116 Cellulitis of left lower limb I89.0 Lymphedema, not elsewhere classified E11.40 Type 2 diabetes mellitus with diabetic neuropathy, unspecified E11.622 Type 2 diabetes mellitus with other skin ulcer L03.115 Cellulitis of right lower limb Facility Procedures CPT4 Code: [id number] Description: 11042 - DEB SUBQ TISSUE 20 SQ CM/< ICD-10 Diagnosis Description L97.521 Non-pressure chronic ulcer of other part of left foot limited to breakdown of skin Modifier: Quantity: 1 CPT4 Code: [id number] Description: (Facility Use Only) [id number] - APPLY MULTLAY COMPRS LWR RT LEG Modifier: 59 Quantity: 1 Physician Procedures : CPT4 Code Description Modifier [id number] 11042 - WC PHYS  SUBQ TISS 20 SQ CM ICD-10 Diagnosis Description L97.521 Non-pressure chronic ulcer of other part of left foot limited to breakdown of skin Quantity: 1 Electronic Signature(s) Signed: 12/12/2021 1:02:30 PM By: Lenda KelpStone III, Elijan Googe PA-C Entered By: Lenda KelpStone III, Kyrra Prada on 12/12/2021 13:02:21

## 2021-12-15 ENCOUNTER — Other Ambulatory Visit: Payer: Self-pay | Admitting: Family Medicine

## 2021-12-17 DIAGNOSIS — B351 Tinea unguium: Secondary | ICD-10-CM | POA: Diagnosis not present

## 2021-12-17 DIAGNOSIS — L97521 Non-pressure chronic ulcer of other part of left foot limited to breakdown of skin: Secondary | ICD-10-CM | POA: Diagnosis not present

## 2021-12-17 DIAGNOSIS — E1142 Type 2 diabetes mellitus with diabetic polyneuropathy: Secondary | ICD-10-CM | POA: Diagnosis not present

## 2021-12-17 DIAGNOSIS — Z794 Long term (current) use of insulin: Secondary | ICD-10-CM | POA: Diagnosis not present

## 2021-12-19 ENCOUNTER — Encounter (HOSPITAL_BASED_OUTPATIENT_CLINIC_OR_DEPARTMENT_OTHER): Payer: Medicare Other | Admitting: Internal Medicine

## 2021-12-19 ENCOUNTER — Other Ambulatory Visit: Payer: Self-pay

## 2021-12-19 DIAGNOSIS — L97211 Non-pressure chronic ulcer of right calf limited to breakdown of skin: Secondary | ICD-10-CM | POA: Diagnosis not present

## 2021-12-19 DIAGNOSIS — E1151 Type 2 diabetes mellitus with diabetic peripheral angiopathy without gangrene: Secondary | ICD-10-CM | POA: Diagnosis not present

## 2021-12-19 DIAGNOSIS — L97221 Non-pressure chronic ulcer of left calf limited to breakdown of skin: Secondary | ICD-10-CM | POA: Diagnosis not present

## 2021-12-19 DIAGNOSIS — L97929 Non-pressure chronic ulcer of unspecified part of left lower leg with unspecified severity: Secondary | ICD-10-CM | POA: Diagnosis not present

## 2021-12-19 DIAGNOSIS — I890 Lymphedema, not elsewhere classified: Secondary | ICD-10-CM | POA: Diagnosis not present

## 2021-12-19 DIAGNOSIS — I87333 Chronic venous hypertension (idiopathic) with ulcer and inflammation of bilateral lower extremity: Secondary | ICD-10-CM | POA: Diagnosis not present

## 2021-12-19 DIAGNOSIS — E11622 Type 2 diabetes mellitus with other skin ulcer: Secondary | ICD-10-CM | POA: Diagnosis not present

## 2021-12-19 DIAGNOSIS — L97511 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 of skin: Secondary | ICD-10-CM | POA: Diagnosis not present

## 2021-12-19 DIAGNOSIS — L97521 Non-pressure chronic ulcer of other part of left foot limited to breakdown of skin: Secondary | ICD-10-CM | POA: Diagnosis not present

## 2021-12-19 DIAGNOSIS — L97912 Non-pressure chronic ulcer of unspecified part of right lower leg with fat layer exposed: Secondary | ICD-10-CM | POA: Diagnosis not present

## 2021-12-19 DIAGNOSIS — E11621 Type 2 diabetes mellitus with foot ulcer: Secondary | ICD-10-CM | POA: Diagnosis not present

## 2021-12-20 ENCOUNTER — Ambulatory Visit (HOSPITAL_COMMUNITY): Payer: Medicare Other | Admitting: Licensed Clinical Social Worker
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
## 2021-12-26 ENCOUNTER — Other Ambulatory Visit: Payer: Self-pay

## 2021-12-26 ENCOUNTER — Encounter (HOSPITAL_BASED_OUTPATIENT_CLINIC_OR_DEPARTMENT_OTHER): Payer: Medicare Other | Admitting: Physician Assistant

## 2021-12-26 DIAGNOSIS — E11621 Type 2 diabetes mellitus with foot ulcer: Secondary | ICD-10-CM | POA: Diagnosis not present

## 2021-12-26 DIAGNOSIS — L97912 Non-pressure chronic ulcer of unspecified part of right lower leg with fat layer exposed: Secondary | ICD-10-CM | POA: Diagnosis not present

## 2021-12-26 DIAGNOSIS — I87333 Chronic venous hypertension (idiopathic) with ulcer and inflammation of bilateral lower extremity: Secondary | ICD-10-CM | POA: Diagnosis not present

## 2021-12-26 DIAGNOSIS — L97521 Non-pressure chronic ulcer of other part of left foot limited to breakdown of skin: Secondary | ICD-10-CM | POA: Diagnosis not present

## 2021-12-26 DIAGNOSIS — I890 Lymphedema, not elsewhere classified: Secondary | ICD-10-CM | POA: Diagnosis not present

## 2021-12-26 DIAGNOSIS — L97511 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 of skin: Secondary | ICD-10-CM | POA: Diagnosis not present

## 2021-12-26 DIAGNOSIS — L97322 Non-pressure chronic ulcer of left ankle with fat layer exposed: Secondary | ICD-10-CM | POA: Diagnosis not present

## 2021-12-26 DIAGNOSIS — L97929 Non-pressure chronic ulcer of unspecified part of left lower leg with unspecified severity: Secondary | ICD-10-CM | POA: Diagnosis not present

## 2021-12-26 DIAGNOSIS — L97522 Non-pressure chronic ulcer of other part of left foot with fat layer exposed: Secondary | ICD-10-CM | POA: Diagnosis not present

## 2021-12-26 DIAGNOSIS — E11622 Type 2 diabetes mellitus with other skin ulcer: Secondary | ICD-10-CM | POA: Diagnosis not present

## 2021-12-26 DIAGNOSIS — L97221 Non-pressure chronic ulcer of left calf limited to breakdown of skin: Secondary | ICD-10-CM | POA: Diagnosis not present

## 2021-12-26 DIAGNOSIS — L97211 Non-pressure chronic ulcer of right calf limited to breakdown of skin: Secondary | ICD-10-CM | POA: Diagnosis not present

## 2021-12-26 DIAGNOSIS — E1151 Type 2 diabetes mellitus with diabetic peripheral angiopathy without gangrene: Secondary | ICD-10-CM | POA: Diagnosis not present

## 2021-12-26 NOTE — Progress Notes (Signed)
RYMAN, RATHGEBER ([id number]) Visit Report for 12/26/2021 Chief Complaint Document Details Patient Name: Date of Service: [name], [name] 12/26/2021 10:30 A M Medical Record Number: [id number] Patient Account Number: [id number] Date of Birth/Sex: Treating RN: 1951-01-23 (71 y.o. [name] Primary Care Provider: Nicoletta Ba Other Clinician: Referring Provider: Treating Provider/Extender: Adele Dan in Treatment: 309 Information Obtained from: Patient Chief Complaint Patient is here for evaluation of venous/lymphedema ulcers Electronic Signature(s) Signed: 12/26/2021 12:02:19 PM By: Lenda Kelp PA-C Entered By: Lenda Kelp on 12/26/2021 12:02:19 -------------------------------------------------------------------------------- HPI Details Patient Name: Date of Service: [name], [name] 12/26/2021 10:30 A M Medical Record Number: [id number] Patient Account Number: [id number] Date of Birth/Sex: Treating RN: Oct 13, 1951 (71 y.o. [name] Primary Care Provider: Nicoletta Ba Other Clinician: Referring Provider: Treating Provider/Extender: Adele Dan in Treatment: 309 History of Present Illness HPI Description: Referred by PCP for consultation. Patient has long standing history of BLE venous stasis, no prior ulcerations. At beginning of month, developed cellulitis and weeping. Received IM Rocephin followed by Keflex and resolved. Wears compression stocking, appr 6 months old. Not sure strength. No present drainage. 01/22/16 this is a patient who is a type II diabetic on insulin. 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 He tells me he religiously wears pressure stockings of uncertain strength. He was here with weeping edema about 8 months ago but did not have an open wound. Roughly a month ago he had a reopening on his bilateral legs. He is been using bandages and Neosporin. He  does not complain of pain. 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 Last BUN/creatinine I have is from 11/20/15 at 13 and 1.0 respectively 01/29/16; patient arrives today having tolerated the Profore wrap. He brought in his stockings and these are 18 mmHg stockings he bought from [place]. The compression here is likely inadequate. He does not complain of pain or excessive drainage she has no systemic symptoms. The wound on the right looks improved as does the one on the left although one on the left is more substantial with still tissue at risk below the actual wound area on the bilateral posterior calf 02/05/16; patient arrives with poor edema control. He states that we did put a 4 layer compression on it last week. No weight appear 5 this. 02/12/16; the area on the posterior right Has healed. The left Has a substantial wound that has necrotic surface eschar that requires a debridement with a curette. 02/16/16;the patient called or a Nurse visit secondary to increased swelling. He had been in earlier in the week with his right leg healed. He was transitioned to is on pressure stocking on the right leg with the only open wound on the left, a substantial area on the left posterior calf. Note he has a history of severe lower extremity edema, he has a history of chronic atrial fibrillation but not heart failure per my notes but I'll need to research this. He is not complaining of chest pain shortness of breath or orthopnea. The intake nurse noted blisters on the previously closed right leg 02/19/16; this is the patient's regular visit day. I see him on Friday with escalating edema new wounds on the right leg and clear signs of at least right ventricular heart failure. I increased his Lasix to 40 twice a  day. He is returning currently in follow-up. States he is noticed a decrease in that the  edema 02/26/16 patient's legs have much less edema. There is nothing really open on the right leg. The left leg has improved condition of the large superficial wound on the posterior left leg 03/04/16; edema control is very much better. The patient's right leg wounds have healed. On the left leg he continues to have severe venous inflammation on the posterior aspect of the left leg. There is no tenderness and I don't think any of this is cellulitis. 03/11/16; patient's right leg is married healed and he is in his own stocking. The patient's left leg has deteriorated somewhat. There is a lot of erythema around the wound on the posterior left leg. There is also a significant rim of erythema posteriorly just above where the wrap would've ended there is a new wound in this location and a lot of tenderness. Can't rule out cellulitis in this area. 03/15/16; patient's right leg remains healed and he is in his own stocking. The patient's left leg is much better than last review. His major wound on the posterior aspect of his left Is almost fully epithelialized. He has 3 small injuries from the wraps. Really. Erythema seems a lot better on antibiotics 03/18/16; right leg remains healed and he is in his own stocking. The patient's left leg is much better. The area on the posterior aspect of the left calf is fully epithelialized. His 3 small injuries which were wrap injuries on the left are improved only one seems still open his erythema has resolved 03/25/16; patient's right leg remains healed and he is in his own stocking. There is no open area today on the left leg posterior leg is completely closed up. His wrap injuries at the superior aspect of his leg are also resolved. He looks as though he has some irritation on the dorsal ankle but this is fully epithelialized without evidence of infection. 03/28/16; we discharged this patient on Monday. Transitioned him into his own stocking. There were problems almost  immediately with uncontrolled swelling weeping edema multiple some of which have opened. He does not feel systemically unwell in particular no chest pain no shortness of breath and he does not feel 04/08/16; the edema is under better control with the Profore light wrap but he still has pitting edema. There is one large wound anteriorly 2 on the medial aspect of his left leg and 3 small areas on the superior posterior calf. Drainage is not excessive he is tolerating a Profore light well 04/15/16; put a Profore wrap on him last week. This is controlled is edema however he had a lot of pain on his left anterior foot most of his wounds are healed 04/22/16 once again the patient has denuded areas on the left anterior foot which he states are because his wrap slips up word. He saw his primary physician today is on Lasix 40 twice a day and states that he his weight is down 20 pounds over the last 3 months. 04/29/16: Much improved. left anterior foot much improved. He is now on Lasix 80 mg per day. Much improved edema control 05/06/16; I was hoping to be able to discharge him today however once again he has blisters at a low level of where the compression was placed last week mostly on his left lateral but also his left medial leg and a small area on the anterior part of the left foot. 05/09/16; apparently the patient  went home after his appointment on 7/4 later in the evening developing pain in his upper medial thigh together with subjective fever and chills although his temperature was not taken. The pain was so intense he felt he would probably have to call 911. However he then remembered that he had leftover doxycycline from a previous round of antibiotics and took these. By the next morning he felt a lot better. He called and spoke to one of our nurses and I approved doxycycline over the phone thinking that this was in relation to the wounds we had previously seen although they were definitely were not. The  patient feels a lot better old fever no chills he is still working. Blood sugars are reasonably controlled 05/13/16; patient is back in for review of his cellulitis on his anterior medial upper thigh. He is taking doxycycline this is a lot better. Culture I did of the nodular area on the dorsal aspect of his foot grew MRSA this also looks a lot better. 05/20/16; the patient is cellulitis on the medial upper thigh has resolved. All of his wound areas including the left anterior foot, areas on the medial aspect of the left calf and the lateral aspect of the calf at all resolved. He has a new blister on the left dorsal foot at the level of the fourth toe this was excised. No evidence of infection 05/27/16; patient continues to complain weeping edema. He has new blisterlike wounds on the left anterior lateral and posterior lateral calf at the top of his wrap levels. The area on his left anterior foot appears better. He is not complaining of fever, pain or pruritus in his feet. 05/30/16; the patient's blisters on his left anterior leg posterior calf all look improved. He did not increase the Lasix 100 mg as I suggested because he was going to run out of his 40 mg tablets. He is still having weeping edema of his toes 06/03/16; I renewed his Lasix at 80 mg once a day as he was about to run out when I last saw him. He is on 80 mg of Lasix now. I have asked him to cut down on the excessive amount of water he was drinking and asked him to drink according to his thirst mechanisms 06/12/2016 -- was seen 2 days ago and was supposed to wear his compression stockings at home but he is developed lymphedema and superficial blisters on the left lower extremity and hence came in for a review 06/24/16; the remaining wound is on his left anterior leg. He still has edema coming from between his toes. There is lymphedema here however his edema is generally better than when I last saw this. He has a history of atrial fibrillation  but does not have a known history of congestive heart failure nevertheless I think he probably has this at least on a diastolic basis. 07/01/16 I reviewed his echocardiogram from January 2017. This was essentially normal. He did not have LVH, EF of 55-60%. His right ventricular function was normal although he did have trivial tricuspid and pulmonic regurgitation. This is not audible on exam however. I increased his Lasix to do massive edema in his legs well above his knees I think in early July. He was also drinking an excessive amount of water at the time. 07/15/16; missed his appointment last week because of the Labor Day holiday on Monday. He could not get another appointment later in the week. Started to feel the wrap digging in superiorly  so we remove the top half and the bottom half of his wrap. He has extensive erythema and blistering superiorly in the left leg. Very tender. Very swollen. Edema in his foot with leaking edema fluid. He has not been systemically unwell 07/22/16; the area on the left leg laterally required some debridement. The medial wounds look more stable. His wrap injury wounds appear to have healed. Edema and his foot is better, weeping edema is also better. He tells me he is meeting with the supplier of the external compression pumps at work 08/05/16; the patient was on vacation last week in [facility]. His wrap is been on for an extended period of time. Also over the weekend he developed an extensive area of tender erythema across his anterior medial thigh. He took to doxycycline yesterday that he had leftover from a previous prescription. The patient complains of weeping edema coming out of his toes 08/08/16; I saw this patient on 10/2. He was tender across his anterior thigh. I put him on doxycycline. He returns today in follow-up. He does not have any open wounds on his lower leg, he still has edema weeping into his toes. 08/12/16; patient was seen back urgently today to  follow-up for his extensive left thigh cellulitis/erysipelas. He comes back with a lot less swelling and erythema pain is much better. I believe I gave him Augmentin and Cipro. His wrap was cut down as he stated a roll down his legs. He developed blistering above the level of the wrap that remained. He has 2 open blisters and 1 intact. 08/19/16; patient is been doing his primary doctor who is increased his Lasix from 40-80 once a day or 80 already has less edema. Cellulitis has remained improved in the left thigh. 2 open areas on the posterior left calf 08/26/16; he returns today having new open blisters on the anterior part of his left leg. He has his compression pumps but is not yet been shown how to use some vital representative from the supplier. 09/02/16 patient returns today with no open wounds on the left leg. Some maceration in his plantar toes 09/10/2016 -- Dr. Leanord Hawking had recently discharged him on 09/02/2016 and he has come right back with redness swelling and some open ulcers on his left lower extremity. He says this was caused by trying to apply his compression stockings and he's been unable to use this and has not been able to use his lymphedema pumps. He had some doxycycline leftover and he has started on this a few days ago. 09/16/16; there are no open wounds on his leg on the left and no evidence of cellulitis. 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 He does not complain of symptoms here. I am not clear use using his external compression pumps. 09/23/16; I have not seen this patient in 2 weeks. He canceled his appointment 10 days ago as he was going on vacation. He tells me that on Monday he noticed a large area on his posterior left leg which is been draining copiously and is reopened into a large wound. He is been using ABDs and the external part of his juxtalite, according to our nurse this was not on properly. 10/07/16;  Still a substantial area on the posterior left leg. Using silver alginate 10/14/16; in general better although there is still open area which looks healthy. Still using silver alginate. He reminds me that this happen before he left for [facility]. T oday while he was showering in the morning. He had been using his juxtalite's 10/21/16; the area on his posterior left leg is fully epithelialized. However he arrives today with a large area of tender erythema in his medial and posterior left thigh just above the knee. I have marked the area. Once again he is reluctant to consider hospitalization. I treated him with oral antibiotics in the past for a similar situation with resolution I think with doxycycline however this area it seems more extensive to me. He is not complaining of fever but does have chills and says states he is thirsty. His blood sugar today was in the 140s at home 10/25/16 the area on his posterior left leg is fully epithelialized although there is still some weeping edema. The large area of tenderness and erythema in his medial and posterior left thigh is a lot less tender although there is still a lot of swelling in this thigh. He states he feels a lot better. He is on doxycycline and Augmentin that I started last week. This will continued until Tuesday, December 26. I have ordered a duplex ultrasound of the left thigh rule out DVT whether there is an abscess something that would need to be drained I would also like to know. 11/01/16; he still has weeping edema from a not fully epithelialized area on his left posterior calf. Most of the rest of this looks a lot better. He has completed his antibiotics. His thigh is a lot better. Duplex ultrasound did not show a DVT in the thigh 11/08/16; he comes in today with more Denuded surface epithelium from the posterior aspect of his calf. There is no real evidence of cellulitis. The superior aspect of his wrap appears to have put quite an  indentation in his leg just below the knee and this may have contributed. He does not complain of pain or fever. We have been using silver alginate as the primary dressing. The area of cellulitis in the right thigh has totally resolved. He has been using his compression stockings once a week 11/15/16; the patient arrives today with more loss of epithelium from the posterior aspect of his left calf. He now has a fairly substantial wound in this area. The reason behind this deterioration isn't exactly clear although his edema is not well controlled. He states he feels he is generally more swollen systemically. He is not complaining of chest pain shortness of breath fever. T me he has an appointment with his primary physician in early February. He is on 80 mg of oral ells Lasix a day. He claims compliance with the external compression pumps. He is not having any pain in his legs similar to what he has with his recurrent cellulitis 11/22/16; the patient arrives a follow-up of his large area on his left lateral calf. This looks somewhat better today. He came in earlier in the week for a dressing change since I saw him a week ago. He is not complaining of any pain no shortness of breath no chest pain 11/28/16; the patient arrives for follow-up of his large area on the left lateral calf this does not look better. In fact it is larger weeping edema. The surface of the wound does not look too bad. We have been using silver alginate although I'm not certain that this is a dressing issue. 12/05/16; again the patient follows up for a large wound on the left lateral and left posterior calf this does not look better. There  continues to be weeping edema necrotic surface tissue. More worrisome than this once again there is erythema below the wound involving the distal Achilles and heel suggestive of cellulitis. He is on his feet working most of the day of this is not going well. We are changing his dressing twice a week to  facilitate the drainage. 12/12/16; not much change in the overall dimensions of the large area on the left posterior calf. This is very inflamed looking. I gave him an. Doxycycline last week does not really seem to have helped. He found the wrap very painful indeed it seems to of dog into his legs superiorly and perhaps around the heel. He came in early today because the drainage had soaked through his dressings. 12/19/16- patient arrives for follow-up evaluation of his left lower extremity ulcers. He states that he is using his lymphedema pumps once daily when there is "no drainage". He admits to not using his lipedema pumps while under current treatment. His blood sugars have been consistently between 150-200. 12/26/16; the patient is not using his compression pumps at home because of the wetness on his feet. I've advised him that I think it's important for him to use this daily. He finds his feet too wet, he can put a plastic bag over his legs while he is in the pumps. Otherwise I think will be in a vicious circle. We are using silver alginate to the major area on his left posterior calf 01/02/17; the patient's posterior left leg has further of all into 3 open wounds. All of them covered with a necrotic surface. He claims to be using his compression pumps once a day. His edema control is marginal. Continue with silver alginate 01/10/17; the patient's left posterior leg actually looks somewhat better. There is less edema, less erythema. Still has 3 open areas covered with a necrotic surface requiring debridement. He claims to be using his compression pumps once a day his edema control is better 01/17/17; the patient's left posterior calf look better last week when I saw him and his wrap was changed 2 days ago. He has noted increasing pain in the left heel and arrives today with much larger wounds extensive erythema extending down into the entire heel area especially tender medially. He is not  systemically unwell CBGs have been controlled no fever. Our intake nurse showed me limegreen drainage on his AVD pads. 01/24/17; his usual this patient responds nicely to antibiotics last week giving him Levaquin for presumed Pseudomonas. The whole entire posterior part of his leg is much better much less inflamed and in the case of his Achilles heel area much less tender. He has also had some epithelialization posteriorly there are still open areas here and still draining but overall considerably better 01/31/17- He has continue to tolerate the compression wraps. he states that he continues to use the lymphedema pumps daily, and can increase to twice daily on the weekends. He is voicing no complaints or concerns regarding his LLE ulcers 02/07/17-he is here for follow-up evaluation. He states that he noted some erythema to the left medial and anterior thigh, which he states is new as of yesterday. He is concerned about recurrent cellulitis. He states his blood sugars have been slightly elevated, this morning in the 180s 02/14/17; he is here for follow-up evaluation. When he was last here there was erythema superiorly from his posterior wound in his anterior thigh. He was prescribed Levaquin however a culture of the wound surface grew MRSA over  the phone I changed him to doxycycline on Monday and things seem to be a lot better. 02/24/17; patient missed his appointment on Friday therefore we changed his nurse visit into a physician visit today. Still using silver alginate on the large area of the posterior left thigh. He isn't new area on the dorsal left second toe 03/03/17; actually better today although he admits he has not used his external compression pumps in the last 2 days or so because of work responsibilities over the weekend. 03/10/17; continued improvement. External compression pumps once a day almost all of his wounds have closed on the posterior left calf. Better edema control 03/17/17; in general  improved. He still has 3 small open areas on the lateral aspect of his left leg however most of the area on the posterior part of his leg is epithelialized. He has better edema control. He has an ABD pad under his stocking on the right anterior lower leg although he did not let us look at that today. 03/24/17; patient arrives back in clinic today with no open areas however there are areas on the posterior left calf and anterior left calf that are less than 100% epithelialized. His edema is well controlled in the left lower leg. There is some pitting edema probably lymphedema in the left upper thigh. He uses compression pumps at home once per day. I tried to get him to do this twice a day although he is very reticent. 04/01/2017 -- for the last 2 days he's had significant redness, tenderness and weeping and came in for an urgent visit today. 04/07/17; patient still has 6 more days of doxycycline. He was seen by Dr. Meyer Russel last Wednesday for cellulitis involving the posterior aspect, lateral aspect of his Involving his heel. For the most part he is better there is less erythema and less weeping. He has been on his feet for 12 hours 2 over the weekend. Using his compression pumps once a day 04/14/17 arrives today with continued improvement. Only one area on the posterior left calf that is not fully epithelialized. He has intense bilateral venous inflammation associated with his chronic venous insufficiency disease and secondary lymphedema. We have been using silver alginate to the left posterior calf wound In passing he tells [place] today that the right leg but we have not seen in quite some time has an open area on it but he doesn't want [place] to look at this today states he will show this to [place] next week. 04/21/17; there is no open area on his left leg although he still reports some weeping edema. He showed [place] his right leg today which is the first time we've seen this leg in a long time. He has a large area of  open wound on the right leg anteriorly healthy granulation. Quite a bit of swelling in the right leg and some degree of venous inflammation. He told us about the right leg in passing last week but states that deterioration in the right leg really only happened over the weekend 04/28/17; there is no open area on the left leg although there is an irritated part on the posterior which is like a wrap injury. The wound on the right leg which was new from last week at least to [place] is a lot better. 05/05/17; still no open area on the left leg. Patient is using his new compression stocking which seems to be doing a good job of controlling the edema. He states he is using his  compression pumps once per day. The right leg still has an open wound although it is better in terms of surface area. Required debridement. A lot of pain in the posterior right Achilles marked tenderness. Usually this type of presentation this patient gives concern for an active cellulitis 05/12/17; patient arrives today with his major wound from last week on the right lateral leg somewhat better. Still requiring debridement. He was using his compression stocking on the left leg however that is reopened with superficial wounds anteriorly he did not have an open wound on this leg previously. He is still using his juxta light's once daily at night. He cannot find the time to do this in the morning as he has to be at work by 7 AM 05/19/17; right lateral leg wound looks improved. No debridement required. The concerning area is on the left posterior leg which appears to almost have a subcutaneous hemorrhagic component to it. We've been using silver alginate to all the wounds 05/26/17; the right lateral leg wound continues to look improved. However the area on the left posterior calf is a tightly adherent surface. [place] using silver alginate. Because of the weeping edema in his legs there is very little good alternatives. 06/02/17; the patient left  here last week looking quite good. Major wound on the left posterior calf and a small one on the right lateral calf. Both of these look satisfactory. He tells me that by Wednesday he had noted increased pain in the left leg and drainage. He called on Thursday and Friday to get an appointment here but we were blocked. He did not go to urgent care or his primary physician. He thinks he had a fever on Thursday but did not actually take his temperature. He has not been using his compression pumps on the left leg because of pain. I advised him to go to the emergency room today for IV antibiotics for stents of left leg cellulitis but he has refused I have asked him to take 2 days off work to keep his leg elevated and he has refused this as well. In view of this I'm going to call him and Augmentin and doxycycline. 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 However after a detailed discussion regarding the risk benefits and alternatives he refuses to get admitted to the hospital. With no other recourse I will continue him on oral antibiotics as before and hopefully he'll have his infectious disease consultation this week. 06/16/2017 -- the patient was seen today by the nurse practitioner at infectious disease Ms. Dixon. 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 He has also been advise Lotrimin cream locally. she also had wise IV antibiotics for his condition if it flares up 06/23/17; patient arrives today with drainage bilaterally although the remaining wound on the left posterior calf after  cleaning up today "highlighter yellow drainage" did not look too bad. Unfortunately he has had breakdown on the right anterior leg [previously this leg had not been open and he is using a black stocking] he went to see infectious  disease and is been put on clindamycin 150 mg daily, I did not verify the dose although I'm not familiar with using clindamycin in this dosing range, perhaps for prophylaxisoo 06/27/17; I brought this patient back today to follow-up on the wound deterioration on the right lower leg together with surrounding cellulitis. I started him on doxycycline 4 days ago. This area looks better however he comes in today with intense cellulitis on the medial part of his left thigh. This is not have a wound in this area. Extremely tender. We've been using silver alginate to the wounds on the right lower leg left lower leg with bilateral 4 layer compression he is using his external compression pumps once a day 07/04/17; patient's left medial thigh cellulitis looks better. He has not been using his compression pumps as his insert said it was contraindicated with cellulitis. His right leg continues to make improvements all the wounds are still open. We only have one remaining wound on the left posterior calf. Using silver alginate to all open areas. He is on doxycycline which I started a week ago and should be finishing I gave him Augmentin after Thursday's visit for the severe cellulitis on the left medial thigh which fortunately looks better 07/14/17; the patient's left medial thigh cellulitis has resolved. The cellulitis in his right lower calf on the right also looks better. All of his wounds are stable to improved we've been using silver alginate he has completed the antibiotics I have given him. He has clindamycin 150 mg once a day prescribed by infectious disease for prophylaxis, I've advised him to start this now. We have been using bilateral Unna boots over silver alginate to the wound  areas 07/21/17; the patient is been to see infectious disease who noted his recurrent problems with cellulitis. He was not able to tolerate prophylactic clindamycin therefore he is on amoxicillin 500 twice a day. He also had a second daily dose of Lasix added By Dr. Oneta Rack but he is not taking this. Nor is he being completely compliant with his compression pumps a especially not this week. He has 2 remaining wounds one on the right posterior lateral lower leg and one on the left posterior medial lower leg. 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 The patient has Unna boots bilaterally. 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 He has bilateral Unna boots for compression and silver alginate to his wounds. Arrives today with his legs looking as good as I have seen him in quite some time. Not surprisingly his wounds look better as well with improvement on the right lateral leg venous insufficiency wound and also the left medial leg. He is still using the compression pumps once a day 08/11/17; both legs appear to be doing better wounds on the right lateral and left medial legs look better. Skin on the right leg quite good. He is been using silver alginate as the primary dressing. I'm going to use Anasept gel calcium alginate and maintain all the secondary dressings 08/18/17; the patient continues to actually do quite well. The area on his right lateral leg is just about closed the left medial also looks better although it is still moist in this area. His edema is well controlled we have been using Anasept gel with calcium alginate  and the usual secondary dressings, 4 layer compression and once daily use of his compression pumps "always been able to manage 09/01/17; the patient  continues to do reasonably well in spite of his trip to [place]. The area on the right lateral leg is epithelialized. Left is much better but still open. He has more edema and more chronic erythema on the left leg [venous inflammation] 09/08/17; he arrives today with no open wound on the right lateral leg and decently controlled edema. Unfortunately his left leg is not nearly as in his good situation as last week.he apparently had increasing edema starting on Saturday. He edema soaked through into his foot so used a plastic bag to walk around his home. The area on the medial right leg which was his open area is about the same however he has lost surface epithelium on the left lateral which is new and he has significant pain in the Achilles area of the left foot. He is already on amoxicillin chronically for prophylaxis of cellulitis in the left leg 09/15/17; he is completed a week of doxycycline and the cellulitis in the left posterior leg and Achilles area is as usual improved. He still has a lot of edema and fluid soaking through his dressings. There is no open wound on the right leg. He saw infectious disease NP today 09/22/17;As usual 1 we transition him from our compression wraps to his stockings things did not go well. He has several small open areas on the right leg. He states this was caused by the compression wrap on his skin although he did not wear this with the stockings over them. He has several superficial areas on the left leg medially laterally posteriorly. He does not have any evidence of active cellulitis especially involving the left Achilles The patient is traveling from [facility] Saturday going [facility] He states he isn't attempting to get an appointment with a heel objects wound center there to change his dressings. I am not completely certain whether this will work 10/06/17; the patient came in on Friday for a nurse visit and the nurse reported that his legs actually look  quite good. He arrives in clinic today for his regular follow-up visit. He has a new wound on his left third toe over the PIP probably caused by friction with his footwear. He has small areas on the left leg and a very superficial but epithelialized area on the right anterior lateral lower leg. Other than that his legs look as good as I've seen him in quite some time. We have been using silver alginate Review of systems; no chest pain no shortness of breath other than this a 10 point review of systems negative 10/20/17; seen by Dr. Meyer Russel last week. He had taken some antibiotics [doxycycline] that he had left over. Dr. Meyer Russel thought he had candida infection and declined to give him further antibiotics. He has a small wound remaining on the right lateral leg several areas on the left leg including a larger area on the left posterior several left medial and anterior and a small wound on the left lateral. The area on the left dorsal third toe looks a lot better. ROS; Gen.; no fever, respiratory no cough no sputum Cardiac no chest pain other than this 10 point review of system is negative 10/30/17; patient arrives today having fallen in the bathtub 3 days ago. It took him a while to get up. He has pain and maceration in the wounds on his  left leg which have deteriorated. He has not been using his pumps he also has some maceration on the right lateral leg. 11/03/17; patient continues to have weeping edema especially in the left leg. This saturates his dressings which were just put on on 12/27. As usual the doxycycline seems to take care of the cellulitis on his lower leg. He is not complaining of fever, chills, or other systemic symptoms. He states his leg feels a lot better on the doxycycline I gave him empirically. He also apparently gets injections at his primary doctor's officeo Rocephin for cellulitis prophylaxis. I didn't ask him about his compression pump compliance today I think that's probably  marginal. Arrives in the clinic with all of his dressings primary and secondary macerated full of fluid and he has bilateral edema 11/10/17; the patient's right leg looks some better although there is still a cluster of wounds on the right lateral. The left leg is inflamed with almost circumferential skin loss medially to laterally although we are still maintaining anteriorly. He does not have overt cellulitis there is a lot of drainage. He is not using compression pumps. We have been using silver alginate to the wound areas, there are not a lot of options here 11/17/17; the patient's right leg continues to be stable although there is still open wounds, better than last week. The inflammation in the left leg is better. Still loss of surface layer epithelium especially posteriorly. There is no overt cellulitis in the amount of edema and his left leg is really quite good, tells me he is using his compression pumps once a day. 11/24/17; patient's right leg has a small superficial wound laterally this continues to improve. The inflammation in the left leg is still improving however we have continuous surface layer epithelial loss posteriorly. There is no overt cellulitis in the amount of edema in both legs is really quite good. He states he is using his compression pumps on the left leg once a day for 5 out of 7 days 12/01/17; very small superficial areas on the right lateral leg continue to improve. Edema control in both legs is better today. He has continued loss of surface epithelialization and left posterior calf although I think this is better. We have been using silver alginate with large number of absorptive secondary dressings 4 layer on the left Unna boot on the right at his request. He tells me he is using his compression pumps once a day 12/08/17; he has no open area on the right leg is edema control is good here. On the left leg however he has marked erythema and tenderness breakdown of skin. He has  what appears to be a wrap injury just distal to the popliteal fossa. 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 There was swelling erythema and breakdown in the right leg. He has much worse skin breakdown in the left leg as well multiple open areas medially and posteriorly as well as laterally. He tells me he has been using his compression pumps but tells me he feels that the drainage out of his leg is worse when he uses a compression pumps. T be fair to him he is been saying this o for a while however I  don't know that I have really been listening to this. I wonder if the compression pumps are working properly 12/22/17;. Once again he arrives with severe erythema, weeping edema from the left greater than right leg. Noncompliance with compression pumps. New this visit he is complaining of pain on the lateral aspect of the right leg and the medial aspect of his right thigh. He apparently saw his cardiologist Dr. [name] who was ordered an echocardiogram area and I think this is a step in the right direction 12/25/17; started his doxycycline Monday night. There is still intense erythema of the right leg especially in the anterior thigh although there is less tenderness. The erythema around the wound on the right lateral calf also is less tender. He still complaining of pain in the left heel. His wounds are about the same right lateral left medial left lateral. Superficial but certainly not close to closure. He denies being systemically unwell no fever chills no abdominal pain no diarrhea 12/29/17; back in follow-up of his extensive right calf and right thigh cellulitis. I added amoxicillin to cover possible doxycycline resistant  strep. This seems to of done the trick he is in much less pain there is much less erythema and swelling. He has his echocardiogram at 11:00 this morning. X-ray of the left heel was also negative. 01/05/18; the patient arrived with his edema under much better control. Now that he is retired he is able to use his compression pumps daily and sometimes twice a day per the patient. He has a wound on the right leg the lateral wound looks better. Area on the left leg also looks a lot better. He has no evidence of cellulitis in his bilateral thighs I had a quick peak at his echocardiogram. He is in normal ejection fraction and normal left ventricular function. He has moderate pulmonary hypertension moderately reduced right ventricular function. One would have to wonder about chronic sleep apnea although he says he doesn't snore. He'll review the echocardiogram with his cardiologist. 01/12/18; the patient arrives with the edema in both legs under exemplary control. He is using his compression pumps daily and sometimes twice daily. His wound on the right lateral leg is just about closed. He still has some weeping areas on the posterior left calf and lateral left calf although everything is just about closed here as well. I have spoken with Aldean Baker who is the patient's nurse practitioner and infectious disease. She was concerned that the patient had not understood that the parenteral penicillin injections he was receiving for cellulitis prophylaxis was actually benefiting him. I don't think the patient actually saw that I would tend to agree we were certainly dealing with less infections although he had a serious one last month. 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 He is healed. He'll be placed in juxtalite compression wraps and increase his lymphedema pumps to twice daily. We will follow up again next week to ensure there are no issues with the new  regiment. 01/20/18-he is here for evaluation of bilateral lower extremity weeping edema. Yesterday he was placed in compression wrap to the right lower extremity and compression stocking to left lower shrubbery. He states he uses lymphedema pumps last night and again this morning and noted a blister to the left lower extremity. On exam he was noted to have drainage to the right lower extremity. He will be placed in Unna boots bilaterally and follow-up next week 01/26/18; patient was actually discharged a  week ago to his own juxta light stockings only to return the next day with bilateral lower extremity weeping edema.he was placed in bilateral Unna boots. He arrives today with pain in the back of his left leg. There is no open area on the right leg however there is a linear/wrap injury on the left leg and weeping edema on the left leg posteriorly. I spoke with infectious disease about 10 days ago. They were disappointed that the patient elected to discontinue prophylactic intramuscular penicillin shots as they felt it was particularly beneficial in reducing the frequency of his cellulitis. I discussed this with the patient today. He does not share this view. He'll definitely need antibiotics today. Finally he is traveling to [place] and trauma leaving this Saturday and returning a week later and he does not travel with his pumps. He is going by car 01/30/18; patient was seen 4 days ago and brought back in today for review of cellulitis in the left leg posteriorly. I put him on amoxicillin this really hasn't helped as much as I might like. He is also worried because he is traveling to [facility] trauma by car. Finally we will be rewrapping him. There is no open area on the right leg over his left leg has multiple weeping areas as usual 02/09/18; The same wrap on for 10 days. He did not pick up the last doxycycline I prescribed for him. He apparently took 4 days worth he already had. There is nothing open on  his right leg and the edema control is really quite good. He's had damage in the left leg medially and laterally especially probably related to the prolonged use of Unna boots 02/12/18; the patient arrived in clinic today for a nurse visit/wrap change. He complained of a lot of pain in the left posterior calf. He is taking doxycycline that I previously prescribed for him. Unfortunately even though he used his stockings and apparently used to compression pumps twice a day he has weeping edema coming out of the lateral part of his right leg. This is coming from the lower anterior lateral skin area. 02/16/18; the patient has finished his doxycycline and will finish the amoxicillin 2 days. The area of cellulitis in the left calf posteriorly has resolved. He is no longer having any pain. He tells me he is using his compression pumps at least once a day sometimes twice. 02/23/18; the patient finished his doxycycline and Amoxil last week. On Friday he noticed a small erythematous circle about the size of a quarter on the left lower leg just above his ankle. This rapidly expanded and he now has erythema on the lateral and posterior part of the thigh. This is bright red. Also has an area on the dorsal foot just above his toes and a tender area just below the left popliteal fossa. He came off his prophylactic penicillin injections at his own insistence one or 2 months ago. This is obviously deteriorated since then 03/02/18; patient is on doxycycline and Amoxil. Culture I did last week of the weeping area on the back of his left calf grew group B strep. I have therefore renewed the amoxicillin 500 3 times a day for a further week. He has not been systemically unwell. Still complaining of an area of discomfort right under his left popliteal fossa. There is no open wound on the right leg. He tells me that he is using his pumps twice a day on most days 03/09/18; patient arrives in clinic today completing  his amoxicillin  today. The cellulitis on his left leg is better. Furthermore he tells me that he had intramuscular penicillin shots that his primary care office today. However he also states that the wrap on his right leg fell down shortly after leaving clinic last week. He developed a large blister that was present when he came in for a nurse visit later in the week and then he developed intense discomfort around this area.He tells me he is using his compression pumps 03/16/18; the patient has completed his doxycycline. The infectious part of this/cellulitis in the left heel area left popliteal area is a lot better. He has 2 open areas on the right calf. Still areas on the left calf but this is a lot better as well. 03/24/18; the patient arrives complaining of pain in the left popliteal area again. He thinks some of this is wrap injury. He has no open area on the right leg and really no open area on the left calf either except for the popliteal area. He claims to be compliant with the compression pumps 03/31/18; I gave him doxycycline last week because of cellulitis in the left popliteal area. This is a lot better although the surface epithelium is denuded off and response to this. He arrives today with uncontrolled edema in the right calf area as well as a fingernail injury in the right lateral calf. There is only a few open areas on the left 04/06/18; I gave him amoxicillin doxycycline over the last 2 weeks that the amoxicillin should be completing currently. He is not complaining of any pain or systemic symptoms. The only open areas see has is on the right lateral lower leg paradoxically I cannot see anything on the left lower leg. He tells me he is using his compression pumps twice a day on most days. Silver alginate to the wounds that are open under 4 layer compression 04/13/18; he completed antibiotics and has no new complaints. Using his compression pumps. Silver alginate that anything that's opened 04/20/18; he is  using his compression pumps religiously. Silver alginate 4 layer compression anything that's opened. He comes in today with no open wounds on the left leg but 3 on the right including a new one posteriorly. He has 2 on the right lateral and one on the right posterior. He likes Unna boots on the right leg for reasons that aren't really clear we had the usual 4 layer compression on the left. It may be necessary to move to the 4 layer compression on the right however for now I left them in the Unna boots 04/27/18; he is using his compression pumps at least once a day. He has still the wounds on the right lateral calf. The area right posteriorly has closed. He does not have an open wound on the left under 4 layer compression however on the dorsal left foot just proximal to the toes and the left third toe 2 small open areas were identified 05/11/18; he has not uses compression pumps. The areas on the right lateral calf have coalesced into one large wound necrotic surface. On the left side he has one small wound anteriorly however the edema is now weeping out of a large part of his left leg. He says he wasn't using his pumps because of the weeping fluid. I explained to him that this is the time he needs to pump more 05/18/18; patient states he is using his compression pumps twice a day. The area on the right lateral  large wound albeit superficial. On the left side he has innumerable number of small new wounds on the left calf particularly laterally but several anteriorly and medially. All these appear to have healthy granulated base these look like the remnants of blisters however they occurred under compression. The patient arrives in clinic today with his legs somewhat better. There is certainly less edema, less multiple open areas on the left calf and the right anterior leg looks somewhat better as well superficial and a little smaller. However he relates pain and erythema over the last 3-4 days in the thigh  and I looked at this today. He has not been systemically unwell no fever no chills no change in blood sugar values 05/25/18; comes in today in a better state. The severe cellulitis on his left leg seems better with the Keflex. Not as tender. He has not been systemically unwell Hard to find an open wound on the left lower leg using his compression pumps twice a day The confluent wounds on his right lateral calf somewhat better looking. These will ultimately need debridement I didn't do this today. 06/01/18; the severe cellulitis on the left anterior thigh has resolved and he is completed his Keflex. There is no open wound on the left leg however there is a superficial excoriation at the base of the third toe dorsally. Skin on the bottom of his left foot is macerated looking. The left the wounds on the lateral right leg actually looks some better although he did require debridement of the top half of this wound area with an open curet 06/09/18 on evaluation today patient appears to be doing poorly in regard to his right lower extremity in particular this appears to likely be infected he has very thick purulent discharge along with a bright green tent to the discharge. This makes me concerned about the possibility of pseudomonas. He's also having increased discomfort at this point on evaluation. Fortunately there does not appear to be any evidence of infection spreading to the other location at this time. 06/16/18 on evaluation today patient appears to actually be doing fairly well. His ulcer has actually diminished in size quite significantly at this point which is good news. Nonetheless he still does have some evidence of infection he did see infectious disease this morning before coming here for his appointment. I did review the results of their evaluation and their note today. They did actually have him discontinue the Cipro and initiate treatment with linezolid at this time. He is doing this for the  next seven days and they recommended a follow-up in four months with them. He is the keep a log of the need for intermittent antibiotic therapy between now and when he falls back up with infectious disease. This will help them gaze what exactly they need to do to try and help them out. 06/23/18; the patient arrives today with no open wounds on the left leg and left third toe healed. He is been using his compression pumps twice a day. On the right lateral leg he still has a sizable wound but this is a lot better than last time I saw this. 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 Has been using silver alginate under 4 layer compression 06/30/18; the only open wound he has is on the right lateral leg and this looks healthy. No debridement is required. We have been using silver  alginate. He does not have an open wound on the left leg. There is apparently some drainage from the dorsal proximal third toe on the left although I see no open wound here. 07/03/18 on evaluation today patient was actually here just for a nurse visit rapid change. However when he was here on Wednesday for his rat change due to having been healed on the left and then developing blisters we initiated the wrap again knowing that he would be back today for [place] to reevaluate and see were at. Unfortunately he has developed some cellulitis into the proximal portion of his right lower extremity even into the region of his thigh. 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 He was placed on one as what at that point. Nonetheless he is done with that and has been tolerating it well otherwise. Doxycycline which in the past really did not seem to be effective for him. Nonetheless I think the best option may be for [place] to definitely reinitiate the antibiotics for a longer period of time. 07/07/18; since I  last saw this patient a week ago he has had a difficult time. At that point he did not have an open wound on his left leg. We transitioned him into juxta light stockings. He was apparently in the clinic the next day with blisters on the left lateral and left medial lower calf. He also had weeping edema fluid. He was put back into a compression wrap. He was also in the clinic on Friday with intense erythema in his right thigh. Per the patient he was started on Bactrim however that didn't work at all in terms of relieving his pain and swelling. He has taken 3 doxycycline that he had left over from last time and that seems to of helped. He has blistering on the right thigh as well. 07/14/18; the erythema on his right thigh has gotten better with doxycycline that he is finishing. 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 Presumably this cellulitis in the thigh was not related to that although I think this is a bit concerning going forward. He still has an area on the right lateral calf the blister on the right medial calf just below the knee that was discussed above. On the left 2 small open areas left medial and left lateral. Edema control is adequate. He is using his compression pumps twice a day 07/20/18; continued improvement in the condition of both legs especially the edema in his bilateral thighs. He tells me he is been losing weight through a combination of diet and exercise. He is using his compression pumps twice a day. So overall she made to the remaining wounds 07/27/2018; continued improvement in condition of both legs. His edema is well controlled. The area on the right lateral leg is just about closed he had one blisters show up on the medial left upper calf. We have him in 4 layer compression. He is going on a 10-day trip to [place] and [place]. He will be driving. He wants to wear Unna boots because of  the lessening amount of constriction. He will not use compression pumps while he is away 08/05/18 on evaluation today patient actually appears to be doing decently well all things considered in regard to his bilateral lower extremities. The worst ulcer is actually only posterior aspect of his left lower  extremity with a four layer compression wrap cut into his leg a couple weeks back. He did have a trip and actually had [employer] for the trip that he is worn since he was last here. Nonetheless he feels like the [employer] actually do better for him his swelling is up a little bit but he also with his trip was not taking his Lasix on a regular set schedule like he was supposed to be. He states that obviously the reason being that he cannot drive and keep going without having to urinate too frequently which makes it difficult. He did not have his pumps with him while he was away either which I think also maybe playing a role here too. 08/13/2018; the patient only has a small open wound on the right lateral calf which is a big improvement in the last month or 2. He also has the area posteriorly just below the posterior fossa on the left which I think was a wrap injury from several weeks ago. He has no current evidence of cellulitis. He tells me he is back into his compression pumps twice a day. He also tells me that while he was at the laundromat somebody stole a section of his extremitease stockings 08/20/2018; back in the clinic with a much improved state. He only has small areas on the right lateral mid calf which is just about healed. This was is more substantial area for quite a prolonged period of time. He has a small open area on the left anterior tibia. The area on the posterior calf just below the popliteal fossa is closed today. He is using his compression pumps twice a day 08/28/2018; patient has no open wound on the right leg. He has a smattering of open areas on the calf with some  weeping lymphedema. More problematically than that it looks as though his wraps of slipped down in his usual he has very angry upper area of edema just below the right medial knee and on the right lateral calf. He has no open area on his feet. The patient is traveling to [facility] next week. I will send him in an antibiotic. We will continue to wrap the right leg. We ordered extremitease stockings for him last week and I plan to transition the right leg to a stocking when he gets home which will be in 10 days time. As usual he is very reluctant to take his pumps with him when he travels 09/07/2018; patient returns from [facility]. He shows me a picture of his left leg in the mid part of his trip last week with intense fire engine erythema. The picture look bad enough I would have considered sending him to the hospital. Instead he went to the wound care center in [facility]. They did not prescribe him antibiotics but he did take some doxycycline he had leftover from a previous visit. I had given him trimethoprim sulfamethoxazole before he left this did not work according to the patient. This is resulted in some improvement fortunately. He comes back with a large wound on the left posterior calf. Smaller area on the left anterior tibia. Denuded blisters on the dorsal left foot over his toes. Does not have much in the way of wounds on the right leg although he does have a very tender area on the right posterior area just below the popliteal fossa also suggestive of infection. He promises me he is back on his pumps twice a  day 09/15/2018; the intense cellulitis in his left lower calf is a lot better. The wound area on the posterior left calf is also so better. However he has reasonably extensive wounds on the dorsal aspect of his second and third toes and the proximal foot just at the base of the toes. There is nothing open on the right leg 09/22/2018; the patient has excellent edema  control in his legs bilaterally. He is using his external compression pumps twice a day. He has no open area on the right leg and only the areas in the left foot dorsally second and third toe area on the left side. He does not have any signs of active cellulitis. 10/06/2018; the patient has good edema control bilaterally. He has no open wound on the right leg. There is a blister in the posterior aspect of his left calf that we had to deal with today. He is using his compression pumps twice a day. There is no signs of active cellulitis. We have been using silver alginate to the wound areas. He still has vulnerable areas on the base of his left first second toes dorsally He has a his extremities stockings and we are going to transition him today into the stocking on the right leg. He is cautioned that he will need to continue to use the compression pumps twice a day. If he notices uncontrolled edema in the right leg he may need to go to 3 times a day. 10/13/2018; the patient came in for a nurse check on Friday he has a large flaccid blister on the right medial calf just below the knee. We unroofed this. He has this and a new area underneath the posterior mid calf which was undoubtedly a blister as well. He also has several small areas on the right which is the area we put his extremities stocking on. 10/19/2018; the patient went to see infectious disease this morning I am not sure if that was a routine follow-up in any case the doxycycline I had given him was discontinued and started on linezolid. He has not started this. It is easy to look at his left calf and the inflammation and think this is cellulitis however he is very tender in the tissue just below the popliteal fossa and I have no doubt that there is infection going on here. He states the problem he is having is that with the compression pumps the edema goes down and then starts walking the wrap falls down. We will see if we can adhere this. He  has 1 or 2 minuscule open areas on the right still areas that are weeping on the posterior left calf, the base of his left second and third toes 10/26/18; back today in clinic with quite of skin breakdown in his left anterior leg. This may have been infection the area below the popliteal fossa seems a lot better however tremendous epithelial loss on the left anterior mid tibia area over quite inexpensive tissue. He has 2 blisters on the right side but no other open wound here. 10/29/2018; came in urgently to see [place] today and we worked him in for review. He states that the 4 layer compression on the right leg caused pain he had to cut it down to roughly his mid calf this caused swelling above the wrap and he has blisters and skin breakdown today. As a result of the pain he has not been using his pumps. Both legs are a lot more edematous and there  is a lot of weeping fluid. 11/02/18; arrives in clinic with continued difficulties in the right leg> left. Leg is swollen and painful. multiple skin blisters and new open areas especially laterally. He has not been using his pumps on the right leg. 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 He is not systemically unwell. 11/09/2018; the patient claims he is being compliant with his pumps. He is finished the doxycycline I gave him last week. Culture I did of the wound on the right lateral leg showed a few very resistant methicillin staph aureus. This was resistant to doxycycline. Nevertheless he states the pain in the leg is a lot better which makes me wonder if the cultured organism was not really what was causing the problem nevertheless this is a very dangerous organism to be culturing out of any wound. His right leg is still a lot larger than the left. He is using an Radio broadcast assistant on this area, he blames a 4-layer compression for causing the original skin breakdown which I doubt is true however I cannot talk him out  of it. We have been using silver alginate to all of these areas which were initially blisters 11/16/2018; patient is being compliant with his external compression pumps at twice a day. Miraculously he arrives in clinic today with absolutely no open wounds. He has better edema control on the left where he has been using 4 layer compression versus wound of wounds on the right and I pointed this out to him. There is no inflammation in the skin in his lower legs which is also somewhat unusual for him. There is no open wounds on the dorsal left foot. He has extremitease stockings at home and I have asked him to bring these in next week. 11/25/18 patient's lower extremity on examination today on the left appears for the most part to be wound free. He does have an open wound on the lateral aspect of the right lower extremity but this is minimal compared to what I've seen in past. He does request that we go ahead and wrap the left leg as well even though there's nothing open just so hopefully it will not reopen in short order. 1/28; patient has superficial open wounds on the right lateral calf left anterior calf and left posterior calf. His edema control is adequate. 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 We have been using silver alginate as the primary dressing. He claims compliance with his compression pumps 2/4; patient has superficial open wounds on numerous areas of his left calf and again one on the left dorsal foot. The areas on the right lateral calf have healed. The cellulitis that I gave him doxycycline for last week is also resolved this was mostly on the left anterior calf just below the tibial tuberosity. His edema looks fairly well-controlled. He tells me he went to see his primary doctor today and had blood work ordered 2/11; once again he has several open areas on the left calf left tibial area. Most of  these are small and appear to have healthy granulation. He does not have anything open on the right. The edema and control in his thighs is pretty good which is usually a good indication he has been using his pumps as requested. 2/18; he continues to have several small areas on the left calf and left tibial area. Most of  these are small healthy granulation. We put him in his stocking on the right leg last week and he arrives with a superficial open area over the right upper tibia and a fairly large area on the right lateral tibia in similar condition. His edema control actually does not look too bad, he claims to be using his compression pumps twice a day 2/25. Continued small areas on the left calf and left tibial area. New areas especially on the right are identified just below the tibial tuberosity and on the right upper tibia itself. There are also areas of weeping edema fluid even without an obvious wound. He does not have a considerable degree of lymphedema but clearly there is more edema here than his skin can handle. He states he is using the pumps twice a day. We have an Unna boot on the right and 4 layer compression on the left. 3/3; he continues to have an area on the right lateral calf and right posterior calf just below the popliteal fossa. There is a fair amount of tenderness around the wound on the popliteal fossa but I did not see any evidence of cellulitis, could just be that the wrap came down and rubbed in this area. He does not have an open area on the left leg however there is an area on the left dorsal foot at the base of the third toe We have been using silver alginate to all wound areas 3/10; he did not have an open area on his left leg last time he was here a week ago. T oday he arrives with a horizontal wound just below the tibial tuberosity and an area on the left lateral calf. He has intense erythema and tenderness in this area. The area is on the right lateral calf and  right posterior calf better than last week. We have been using silver alginate as usual 3/18 - Patient returns with 3 small open areas on left calf, and 1 small open area on right calf, the skin looks ok with no significant erythema, he continues the UNA boot on right and 4 layer compression on left. The right lateral calf wound is closed , the right posterior is small area. we will continue silver alginate to the areas. 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 He is been tolerating the dressing changes without complication. Fortunately she has made excellent improvement in regard to the overall status of his wounds. Unfortunately every time we cease wrapping him he ends up reopening in causing more significant issues at that point. Again I'm unsure of the best direction to take although I think the lymphedema clinic may be appropriate for him. 02/03/19 on evaluation today patient appears to be doing well in regard to the wounds that we saw him for last week unfortunately he has a new area on the proximal portion of his right medial/posterior lower extremity where the wrap somewhat slowed down and caused swelling and a blister to rub and open. Unfortunately this is the only opening that he has on either leg at this point. 02/17/19 on evaluation today patient's bilateral lower extremities appear to be doing well. He still completely healed in regard to the left lower extremity. In regard to the right lower extremity the area where the wrap and slid down and caused the  blister still seems to be slightly open although this is dramatically better than during the last evaluation two weeks ago. I'm very pleased with the way this stands overall. 03/03/19 on evaluation today patient appears to be doing well in regard  to his right lower extremity in general although he did have a new blister open this does not appear to be showing any evidence of active infection at this time. Fortunately there's No fevers, chills, nausea, or vomiting noted at this time. Overall I feel like he is making good progress it does feel like that the right leg will we perform the [employer] seems to do with a bit better than three layer wrap on the left which slid down on him. We may switch to doing bilateral in the book wraps. 5/4; I have not seen Mr. Heinbaugh in quite some time. According to our case manager he did not have an open wound on his left leg last week. He had 1 remaining wound on the right posterior medial calf. He arrives today with multiple openings on the left leg probably were blisters and/or wrap injuries from Unna boots. I do not think the Unna boot's will provide adequate compression on the left. I am also not clear about the frequency he is using the compression pumps. 03/17/19 on evaluation today patient appears to be doing excellent in regard to his lower extremities compared to last week's evaluation apparently. He had gotten significantly worse last week which is unfortunate. The [employer] wrap on the left did not seem to do very well for him at all and in fact it didn't control his swelling significantly enough he had an additional outbreak. Subsequently we go back to the four layer compression wrap on the left. This is good news. At least in that he is doing better and the wound seem to be killing him. He still has not heard anything from the lymphedema clinic. 03/24/19 on evaluation today patient actually appears to be doing much better in regard to his bilateral lower Trinity as compared to last week when I saw him. Fortunately there's no signs of active infection at this time. He has been tolerating the dressing changes without complication. Overall I'm extremely pleased with the progress and appearance in  general. 04/07/19 on evaluation today patient appears to be doing well in regard to his bilateral lower extremities. His swelling is significantly down from where it was previous. With that being said he does have a couple blisters still open at this point but fortunately nothing that seems to be too severe and again the majority of the larger openings has healed at this time. 04/14/19 on evaluation today patient actually appears to be doing quite well in regard to his bilateral lower extremities in fact I'm not even sure there's anything significantly open at this time at any site. Nonetheless he did have some trouble with these wraps where they are somewhat irritating him secondary to the fact that he has noted that the graph wasn't too close down to the end of this foot in a little bit short as well up to his knee. Otherwise things seem to be doing quite well. 04/21/19 upon evaluation today patient's wound bed actually showed evidence of being completely healed in regard to both lower extremities which is excellent news. There does not appear to be any signs of active infection which is also good news. I'm very pleased in this regard. No fevers, chills, nausea, or vomiting noted at this time.  04/28/19 on evaluation today patient appears to be doing a little bit worse in regard to both lower extremities on the left mainly due to the fact that when he went infection disease the wrap was not wrapped quite high enough he developed a blister above this. On the right he is a small open area of nothing too significant but again this is continuing to give him some trouble he has been were in the Velcro compression that he has at home. 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 He's been tolerating the dressing changes without complication. Fortunately there's no signs of active infection at this time. No fevers, chills, nausea, or vomiting noted at  this time. We have been trying to get an appointment with her lymphedema clinic in [facility] but unfortunately nobody can get them on phone with not been able to even fax information over the patient likewise is not been able to get in touch with them. Overall I'm not sure exactly what's going on here with to reach out again today. 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 Still having a lot of drainage unfortunately. He tells me especially in the left but even on the right. There's no signs of active infection which is good news we've been using so ratcheted up to this point. 05/19/19 on evaluation today patient actually appears to be doing quite well with regard to his left lower extremity which is great news. Fortunately in regard to the right lower extremity has an issues with his wrap and he subsequently did remove this from what I'm understanding. Nonetheless long story short is what he had rewrapped once he removed it subsequently had maggots underneath this wrap whenever he came in for evaluation today. With that being said they were obviously completely cleaned away by the nursing staff. The visit today which is excellent news. However he does appear to potentially have some infection around the right ankle region where the maggots were located as well. He will likely require anabiotic therapy today. 05/26/19 on evaluation today patient actually appears to be doing much better in regard to his bilateral lower extremities. I feel like the infection is under much better control. With that being said there were maggots noted when the wrap was removed yet again today. Again this could have potentially been left over from previous although at this time there does not appear to be any signs of significant drainage there was obviously on the wrap some drainage as well this contracted gnats or  otherwise. Either way I do not see anything that appears to be doing worse in my pinion and in fact I think his drainage has slowed down quite significantly likely mainly due to the fact to his infection being under better control. 06/02/2019 on evaluation today patient actually appears to be doing well with regard to his bilateral lower extremities there is no signs of active infection at this time which is great news. With that being said he does have several open areas more so on the right than the left but nonetheless these are all significantly better than previously noted. 06/09/2019 on evaluation today patient actually appears to be doing well. His wrap stayed up and he did not cause any problems he had more drainage on the right compared to the left but overall I do not see any major issues  at this time which is great news. 06/16/2019 on evaluation today patient appears to be doing excellent with regard to his lower extremities the only area that is open is a new blister that can have opened as of today on the medial ankle on the left. Other than this he really seems to be doing great I see no major issues at this point. 06/23/2019 on evaluation today patient appears to be doing quite well with regard to his bilateral lower extremities. In fact he actually appears to be almost completely healed there is a small area of weeping noted of the right lower extremity just above the ankle. Nonetheless fortunately there is no signs of active infection at this time which is good news. No fevers, chills, nausea, vomiting, or diarrhea. 8/24; the patient arrived for a nurse visit today but complained of very significant pain in the left leg and therefore I was asked to look at this. Noted that he did not have an open area on the left leg last week nevertheless this was wrapped. The patient states that he is not been able to put his compression pumps on the left leg because of the discomfort. He has not been  systemically unwell 06/30/2019 on evaluation today patient unfortunately despite being excellent last week is doing much worse with regard to his left lower extremity today. In fact he had to come in for a nurse on Monday where his left leg had to be rewrapped due to excessive weeping Dr. Leanord Hawking placed him on doxycycline at that point. Fortunately there is no signs of active infection Systemically at this time which is good news. 07/07/2019 in regard to the patient's wounds today he actually seems to be doing well with his right lower extremity there really is nothing open or draining at this point this is great news. Unfortunately the left lower extremity is given him additional trouble at this time. There does not appear to be any signs of active infection nonetheless he does have a lot of edema and swelling noted at this point as well as blistering all of which has led to a much more poor appearing leg at this time compared to where it was 2 weeks ago when it was almost completely healed. Obviously this is a little discouraging for the patient. He is try to contact the lymphedema clinic in [place] he has not been able to get through to them. 07/14/2019 on evaluation today patient actually appears to be doing slightly better with regard to his left lower extremity ulcers. Overall I do feel like at least at the top of the wrap that we have been placing this area has healed quite nicely and looks much better. The remainder of the leg is showing signs of improvement. Unfortunately in the thigh area he still has an open region on the left and again on the right he has been utilizing just a Band-Aid on an area that also opened on the thigh. Again this is an area that were not able to wrap although we did do an Ace wrap to provide some compression that something that obviously is a little less effective than the compression wraps we have been using on the lower portion of the leg. He does have an  appointment with the lymphedema clinic in [facility] on Friday. 07/21/2019 on evaluation today patient appears to be doing better with regard to his lower extremity ulcers. He has been tolerating the dressing changes without complication. Fortunately there is no signs of  active infection at this time. No fevers, chills, nausea, vomiting, or diarrhea. I did receive the paperwork from the physical therapist at the lymphedema clinic in [place]. Subsequently I signed off on that this morning and sent that back to him for further progression with the treatment plan. 07/28/2019 on evaluation today patient appears to be doing very well with regard to his right lower extremity where I do not see any open wounds at this point. Fortunately he is feeling great as far as that is concerned as well. In regard to the left lower extremity he has been having issues with still several areas of weeping and edema although the upper leg is doing better his lower leg still I think is going require the compression wrap at this time. No fevers, chills, nausea, vomiting, or diarrhea. 08/04/2019 on evaluation today patient unfortunately is having new wounds on the right lower extremity. Again we have been using Unna boot wrap on that side. We switched him to using his juxta lite wrap at home. With that being said he tells me he has been using it although his legs extremely swollen and to be honest really does not appear that he has been. I cannot know that for sure however. Nonetheless he has multiple new wounds on the right lower extremity at this time. Obviously we will have to see about getting this rewrapped for him today. 08/11/2019 on evaluation today patient appears to be doing fairly well with regard to his wounds. He has been tolerating the dressing changes including the compression wraps without complication. He still has a lot of edema in his upper thigh regions bilaterally he is supposed to be seeing the  lymphedema clinic on the 15th of this month once his wraps arrive for the upper part of his legs. 08/18/2019 on evaluation today patient appears to be doing well with regard to his bilateral lower extremities at this point. He has been tolerating the dressing changes without complication. Fortunately there is no signs of active infection which is also good news. He does have a couple weeping areas on the first and second toe of the right foot he also has just a small area on the left foot upper leg and a small area on the left lower leg but overall he is doing quite well in my opinion. He is supposed to be getting his wraps shortly in fact tomorrow and then subsequently is seeing the lymphedema clinic next Wednesday on the 21st. Of note he is also leaving on the 25th to go on vacation for a week to the beach. For that reason and since there is some uncertainty about what there can be doing at lymphedema clinic next Wednesday I am get a make an appointment for next Friday here for [place] to see what we need to do for him prior to him leaving for vacation. 10/23; patient arrives in considerable pain predominantly in the upper posterior calf just distal to the popliteal fossa also in the wound anteriorly above the major wound. This is probably cellulitis and he has had this recurrently in the past. He has no open wound on the right side and he has had an Radio broadcast assistant in that area. Finally I note that he has an area on the left posterior calf which by enlarge is mostly epithelialized. This protrudes beyond the borders of the surrounding skin in the setting of dry scaly skin and lymphedema. The patient is leaving for [facility] [name] on Sunday. Per his longstanding pattern,  he will not take his compression pumps with him predominantly out of fear that they will be stolen. He therefore asked that we put a Unna boot back on the right leg. He will also contact the wound care center in [facility] to see if they can  change his dressing in the mid week. 11/3; patient returned from his vacation to [facility]. He was seen on 1 occasion at their wound care center. They did a 2 layer compression system as they did not have our 4-layer wrap. I am not completely certain what they put on the wounds. They did not change the Unna boot on the right. The patient is also seeing a lymphedema specialist physical therapist in [place]. It appears that he has some compression sleeve for his thighs which indeed look quite a bit better than I am used to seeing. He pumps over these with his external compression pumps. 11/10; the patient has a new wound on the right medial thigh otherwise there is no open areas on the right. He has an area on the left leg posteriorly anteriorly and medially and an area over the left second toe. We have been using silver alginate. He thinks the injury on his thigh is secondary to friction from the compression sleeve he has. 11/17; the patient has a new wound on the right medial thigh last week. He thinks this is because he did not have a underlying stocking for his thigh juxta lite apparatus. He now has this. The area is fairly large and somewhat angry but I do not think he has underlying cellulitis. He has a intact blister on the right anterior tibial area. Small wound on the right great toe dorsally Small area on the medial left calf. 11/30; the patient does not have any open areas on his right leg and we did not take his juxta lite stocking off. However he states that on Friday his compression wrap fell down lodging around his upper mid calf area. As usual this creates a lot of problems for him. He called urgently today to be seen for a nurse visit however the nurse visit turned into a provider visit because of extreme erythema and pain in the left anterior tibia extending laterally and posteriorly. The area that is problematic is extensive 10/06/2019 upon evaluation today patient actually  appears to be doing poorly in regard to his left lower extremity. He Dr. Leanord Hawking did place him on doxycycline this past Monday apparently due to the fact that he was doing much worse in regard to this left leg. Fortunately the doxycycline does seem to be helping. Unfortunately we are still having a very difficult time getting his edema under any type of control in order to anticipate discharge at some point. The only way were really able to control his lymphedema really is with compression wraps and that has only even seemingly temporary. He has been seeing a lymphedema clinic they are trying to help in this regard but still this has been somewhat frustrating in general for the patient. 10/13/19 on evaluation today patient appears to be doing excellent with regard to his right lower extremity as far as the wounds are concerned. His swelling is still quite extensive unfortunately. He is still having a lot of drainage from the thigh areas bilaterally which is unfortunate. He's been going to lymphedema clinic but again he still really does not have this edema under control as far as his lower extremities are concern. With regard to his left  lower extremity this seems to be improving and I do believe the doxycycline has been of benefit for him. He is about to complete the doxycycline. 10/20/2019 on evaluation today patient appears to be doing poorly in regard to his bilateral lower extremities. More in the right thigh he has a lot of irritation at this site unfortunately. In regard to the left lower extremity the wrap was not quite as high it appears and does seem to have caused him some trouble as well. Fortunately there is no evidence of systemic infection though he does have some blue-green drainage which has me concerned for the possibility of Pseudomonas. He tells me he is previously taking Cipro without complications and he really does not care for Levaquin however due to some of the side effects he  has. He is not allergic to any medications specifically antibiotics that were aware of. 10/27/2019 on evaluation today patient actually does appear to be for the most part doing better when compared to last week's evaluation. With that being said he still has multiple open wounds over the bilateral lower extremities. He actually forgot to start taking the Cipro and states that he still has the whole bottle. He does have several new blisters on left lower extremity today I think I would recommend he go ahead and take the Cipro based on what I am seeing at this point. 12/30-Patient comes at 1 week visit, 4 layer compression wraps on the left and Unna boot on the right, primary dressing Xtrasorb and silver alginate. Patient is taking his Cipro and has a few more days left probably 5-6, and the legs are doing better. He states he is using his compressions devices which I believe he has 11/10/2019 on evaluation today patient actually appears to be much better than last time I saw him 2 weeks ago. His wounds are significantly improved and overall I am very pleased in this regard. Fortunately there is no signs of active infection at this time. He is just a couple of days away from completing Cipro. Overall his edema is much better he has been using his lymphedema pumps which I think is also helping at this point. 11/17/2019 on evaluation today patient appears to be doing excellent in regard to his wounds in general. His legs are swollen but not nearly as much as they have been in the past. Fortunately he is tolerating the compression wraps without complication. No fevers, chills, nausea, vomiting, or diarrhea. He does have some erythema however in the distal portion of his right lower extremity specifically around the forefoot and toes there is a little bit of warmth here as well. 11/24/2019 on evaluation today patient appears to be doing well with regard to his right lower extremity I really do not see any open  wounds at this point. His left lower extremity does have several open areas and his right medial thigh also is open. Other than this however overall the patient seems to be making good progress and I am very pleased at this point. 12/01/2019 on evaluation today patient appears to be doing poorly at this point in regard to his left lower extremity has several new blisters despite the fact that we have him in compression wraps. In fact he had a 4-layer compression wrap, his upper thigh wrapped from lymphedema clinic, and a juxta light over top of the 4 layer compression wrap the lymphedema clinic applied and despite all this he still develop blisters underneath. Obviously this does have me concerned  about the fact that unfortunately despite what we are doing to try to get wounds healed he continues to have new areas arise I do not think he is ever good to be at the point where he can realistically just use wraps at home to keep things under control. Typically when we heal him it takes about 1-2 days before he is back in the clinic with severe breakdown and blistering of his lower extremities bilaterally. This is happened numerous times in the past. Unfortunately I think that we may need some help as far as overall fluid overload to kind of limit what we are seeing and get things under better control. 12/08/2019 on evaluation today patient presents for follow-up concerning his ongoing bilateral lower extremity edema. Unfortunately he is still having quite a bit of swelling the compression wraps are controlling this to some degree but he did see Dr. [name] his cardiologist I do have that available for review today as far as the appointment was concerned that was on 12/06/2019. Obviously that she has been 2 days ago. The patient states that he is only been taking the Lasix 80 mg 1 time a day he had told me previously he was taking this twice a day. Nonetheless Dr. [name] recommended this be up to 80 mg 2 times a  day for the patient as he did appear to be fluid overloaded. With that being said the patient states he did this yesterday and he was unable to go anywhere or do anything due to the fact that he was constantly having to urinate. Nonetheless I think that this is still good to be something that is important for him as far as trying to get his edema under control at all things that he is going to be able to just expect his wounds to get under control and things to be better without going through at least a period of time where he is trying to stabilize his fluid management in general and I think increasing the Lasix is likely the first step here. It was also mentioned the possibility that the patient may require metolazone. With that being said he wanted to have the patient take Lasix twice a day first and then reevaluating 2 months to see where things stand. 12/15/2019 upon evaluation today patient appears to be doing regard to his legs although his toes are showing some signs of weeping especially on the left at this point to some degree on the right. There does not appear to be any signs of active infection and overall I do feel like the compression wraps are doing well for him but he has not been able to take the Lasix at home and the increased dose that Dr. [name] recommended. He tells me that just not go to be feasible for him. Nonetheless I think in this case he should probably send a message to Dr. [name] in order to discuss options from the standpoint of possible admission to get the fluid off or otherwise going forward. 12/22/2019 upon evaluation today patient appears to be doing fairly well with regard to his lower extremities at this point. In fact he would be doing excellent if it was not for the fact that his right anterior thigh apparently had an allergic reaction to adhesive tape that he used. The wound itself that we have been monitoring actually appears to be healed. There is a lot of  irritation at this point. 12/29/2019 upon evaluation today patient appears to be doing well  in regard to his lower extremities. His left medial thigh is open and somewhat draining today but this is the only region that is open the right has done much better with the treatment utilizing the steroid cream that I prescribed for him last week. Overall I am pleased in that regard. Fortunately there is no signs of active infection at this time. No fevers, chills, nausea, vomiting, or diarrhea. 01/05/2020 upon evaluation today patient appears to be doing more poorly in regard to his right lower extremity at this point upon evaluation today. Unfortunately he continues to have issues in this regard and I think the biggest issue is controlling his edema. This obviously is not very well controlled at this point is been recommended that he use the Lasix twice a day but he has not been able to do that. Unfortunately I think this is leading to an issue where honestly he is not really able to effectively control his edema and therefore the wounds really are not doing significantly better. I do not think that he is going to be able to keep things under good control unless he is able to control his edema much better. I discussed this again in great detail with him today. 01/12/2020 good news is patient actually appears to be doing quite well today at this point. He does have an appointment with lymphedema clinic tomorrow. His legs appear healed and the toe on the left is almost completely healed. In general I am very pleased with how things stand at this point. 01/19/2020 upon evaluation today patient appears to actually be doing well in regard to his lower extremities there is nothing open at this point. Fortunately he has done extremely well more recently. Has been seeing lymphedema clinic as well. With that being said he has Velcro wraps for his lower legs as well as his upper legs. The only wound really is on his toe  which is the right great toe and this is barely anything even there. With all that being said I think it is good to be appropriate today to go ahead and switch him over to the Velcro compression wraps. 01/26/2020 upon evaluation today patient appears to be doing worse with regard to his lower extremities after last week switch him to Velcro compression wraps. Unfortunately he lasted less than 24 hours he did not have the sock portion of his Velcro wrap on the left leg and subsequently developed a blister underneath the Velcro portion. Obviously this is not good and not what we were looking for at this point. He states the lymphedema clinic did tell him to wear the wrap for 23 hours and take him off for 1 I am okay with that plan but again right now we got a get things back under control again he may have some cellulitis noted as well. 02/02/2020 upon evaluation today patient unfortunately appears to have several areas of blistering on his bilateral lower extremities today mainly on the feet. His legs do seem to be doing somewhat better which is good news. Fortunately there is no evidence of active infection at this time. No fevers, chills, nausea, vomiting, or diarrhea. 02/16/2020 upon evaluation today patient appears to be doing well at this time with regard to his legs. He has a couple weeping areas on his toes but for the most part everything is doing better and does appear to be sealed up on his legs which is excellent news. We can continue with wrapping him at this point  as he had every time we discontinue the wraps he just breaks out with new wounds. There is really no point in is going forward with this at this point. 03/08/2020 upon evaluation today patient actually appears to be doing quite well with regard to his lower extremity ulcers. He has just a very superficial and really almost nonexistent blister on the left lower extremity he has in general done very well with the compression wraps. With  that being said I do not see any signs of infection at this time which is good news. 03/29/2020 upon evaluation today patient appears to be doing well with regard to his wounds currently except for where he had several new areas that opened up due to some of the wrap slipping and causing him trouble. He states he did not realize they had slipped. Nonetheless he has a 1 area on the right and 3 new areas on the left. Fortunately there is no signs of active infection at this time which is great news. 04/05/2020 upon evaluation today patient actually appears to be doing quite well in general in regard to his legs currently. Fortunately there is no signs of active infection at this time. No fevers, chills, nausea, vomiting, or diarrhea. He tells me next week that he will actually be seen in the lymphedema clinic on Thursday at 10 AM I see him on Wednesday next week. 04/12/2020 upon evaluation today patient appears to be doing very well with regard to his lower extremities bilaterally. In fact he does not appear to have any open wounds at this point which is good news. Fortunately there is no signs of active infection at this time. No fevers, chills, nausea, vomiting, or diarrhea. 04/19/2020 upon evaluation today patient appears to be doing well with regard to his wounds currently on the bilateral lower extremities. There does not appear to be any signs of active infection at this time. Fortunately there is no evidence of systemic infection and overall very pleased at this point. Nonetheless after I held him out last week he literally had blisters the next morning already which swelled up with him being right back here in the clinic. Overall I think that he is just not can be able to be discharged with his legs the way they are he is much to volume overloaded as far as fluid is concerned and that was discussed with him today of also discussed this but should try the clinic nurse manager as well as Dr.  Leanord Hawking. 04/26/2020 upon evaluation today patient appears to be doing better with regard to his wounds currently. He is making some progress and overall swelling is under good control with the compression wraps. Fortunately there is no evidence of active infection at this time. 05/10/2020 on evaluation today patient appears to be doing overall well in regard to his lower extremities bilaterally. He is Tolerating the compression wraps without complication and with what we are seeing currently I feel like that he is making excellent progress. There is no signs of active infection at this time. 05/24/2020 upon evaluation today patient appears to be doing well in regard to his legs. The swelling is actually quite a bit down compared to where it has been in the past. Fortunately there is no sign of active infection at this time which is also good news. With that being said he does have several wounds on his toes that have opened up at this point. 05/31/2020 upon evaluation today patient appears to be doing well  with regard to his legs bilaterally where he really has no significant fluid buildup at this point overall he seems to be doing quite well. Very pleased in this regard. With regard to his toes these also seem to be drying up which is excellent. We have continue to wrap him as every time we tried as a transition to the juxta light wraps things just do not seem to get any better. 06/07/2020 upon evaluation today patient appears to be doing well with regard to his right leg at this point. Unfortunately left leg has a lot of blistering he tells me the wrap started to slide down on him when he tried to put his other Velcro wrap over top of it to help keep things in order but nonetheless still had some issues. 06/14/2020 on evaluation today patient appears to be doing well with regard to his lower extremity ulcers and foot ulcers at this point. I feel like everything is actually showing signs of improvement which  is great news overall there is no signs of active infection at this time. No fevers, chills, nausea, vomiting, or diarrhea. 06/21/2020 on evaluation today patient actually appears to be doing okay in regard to his wounds in general. With that being said the biggest issue I see is on his right foot in particular the first and second toe seem to be doing a little worse due to the fact this is staying very wet. I think he is probably getting need to change out his dressings a couple times in between each week when we see him in regard to his toes in order to keep this drier based on the location and how this is proceeding. 06/28/2020 on evaluation today patient appears to be doing a little bit more poorly overall in regard to the appearance of the skin I am actually somewhat concerned about the possibility of him having a little bit of an infection here. We discussed the course of potentially giving him a doxycycline prescription which he is taken previously with good result. With that being said I do believe that this is potentially mild and at this point easily fixed. I just do not want anything to get any worse. 07/12/2020 upon evaluation today patient actually appears to be making some progress with regard to his legs which is great news there does not appear to be any evidence of active infection. Overall very pleased with where things stand. 07/26/2020 upon evaluation today patient appears to be doing well with regard to his leg ulcers and toe ulcers at this point. He has been tolerating the compression wraps without complication overall very pleased in this regard. 08/09/2020 upon evaluation today patient appears to be doing well with regard to his lower extremities bilaterally. Fortunately there is no signs of active infection overall I am pleased with where things stand. 08/23/2020 on evaluation today patient appears to be doing well with regard to his wound. He has been tolerating the dressing  changes without complication. Fortunately there is no signs of active infection at this time. Overall his legs seem to be doing quite well which is great news and I am very pleased in that regard. No fevers, chills, nausea, vomiting, or diarrhea. 09/13/2020 upon evaluation today patient appears to be doing okay in regard to his lower extremities. He does have a fairly large blister on the right leg which I did remove the blister tissue from today so we can get this to dry out other than that however he  seems to be doing quite well. There is no signs of active infection at this time. 09/27/2020 upon evaluation today patient appears to actually be doing some better in regard to his right leg. Fortunately signs of active infection at this time which is great news. No fevers, chills, nausea, vomiting, or diarrhea. 10/04/2020 upon evaluation today patient actually appears to be showing signs of improvement which is great news with regard to his leg ulcers. Fortunately there is no signs of active infection which is great news he is still taking the antibiotics currently. No fevers, chills, nausea, vomiting, or diarrhea. 10/18/2020 on evaluation today patient appears to be doing well with regard to his legs currently. He has been tolerating the dressing changes including the wraps without complication. Fortunately there is no signs of active infection at this time. No fevers, chills, nausea, vomiting, or diarrhea. 10/25/2020 upon evaluation today patient appears to be doing decently well in regard to his wounds currently. He has been tolerating the dressing changes without complication. Overall I feel like he is making good progress albeit slow. Again this is something we can have to continue to wrap for some time to come most likely. 11/08/2020 upon evaluation today patient appears to be doing well with regard to his wounds currently. He has been tolerating the dressing changes without complication is not  currently on any antibiotics and he does not appear to show any signs of infection. He does continue to have a lot of drainage on the right leg not too severe but nonetheless this is very scattered. On the left leg this is looking to be much improved overall. 11/15/2020 upon evaluation today patient appears to be doing better with regard to his legs bilaterally. Especially the right leg which was much more significant last week. There does not appear to be any signs of active infection which is great news. No fevers, chills, nausea, vomiting, or diarrhea. 11/23/2019 upon evaluation today patient appears to be doing poorly still in regard to his lower extremities bilaterally. Unfortunately his right leg in particular appears to be doing much more poorly there is no signs really of infection this is not warm to touch but he does have a lot of drainage and weeping unfortunately. With that reason I do believe that we may need to initiate some treatment here to try to help calm down some of the swelling of the right leg. I think switching to a 4-layer compression wrap would be beneficial here. The patient is in agreement with giving this a try. 11/29/2020 upon evaluation today patient appears to be doing well currently in regard to his leg ulcers. I feel like the right leg is doing better he still has a lot of drainage but we do see some improvement here. The 4-layer compression wrap I think was helpful. 12/06/2020 upon evaluation today patient appears to be doing well with regard to his legs. In fact they seem to be doing about the best I have seen up to this point. Fortunately there is no signs of active infection at this time. No fevers, chills, nausea, vomiting, or diarrhea. 12/20/2020 upon evaluation today patient appears to be doing well at this time in regard to his legs. He is not having any significant draining which is great news. Fortunately there is no signs of active infection at this time. No fevers,  chills, nausea, vomiting, or diarrhea. 01/17/2021 upon evaluation today Verdon CumminsJesse actually appears to be doing excellent in regard to his legs. He has a  few areas again that come and go as far as his toes are concerned but overall this is doing quite well. 01/31/2021 upon evaluation today patient appears to be doing well with regard to his legs. Fortunately there does not appear to be any signs of active infection which is great news. Overall he is still having significant edema despite the compression wraps basically the 4-layer compression wrap to just keep things under control there is really not much room for play. 4/13: Mr. Rushing is a longstanding patient in our clinic and benefits greatly from weekly compression wraps. Today he has no complaints. He has been tolerating the wraps well. He states he is using the lymphedema pumps at home. 5/4; patient presents for follow-up of his chronic lymphedema/venous insufficiency ulcers. He comes weekly for compression wraps. He has no complaints today. He was unable to tolerate the Coflex 2 layer Last week so we will do the four press 4-layer compression. He has been using his lymphedema pumps daily. 5/18; patient presents for 2-week follow-up. He has no complaints or issues today. He has developed a new wound to the right foot on his fourth toe. He overall feels well and denies signs of infection. 6/1; patient presents for 2-week follow-up. He has no complaints or issues today. He denies signs of infection. 04/18/2021 upon evaluation today patient appears to be doing well with regard to his legs bilaterally. Family open wound is actually on the toe of his left foot everything else is completely closed which is great news. In general I am extremely pleased with where things stand at this point. The patient is also happy that things are doing so well. 05/02/2021 upon evaluation today patient's legs actually appear to be doing quite well today. Fortunately there  does not appear to be any signs of active infection which is great and overall I am extremely pleased with where he stands today. The patient does not appear to have any evidence of active infection at this time which is also great news. 05/09/2021 upon evaluation today patient appears to be doing a little bit more poorly in regard to his legs. Unfortunately he is having issues with some breakdown and a blood blister on the left leg this is due to I believe honestly to how it was wrapped last week. Fortunately there does not appear to be any signs of infection but nonetheless this is still a concern to be honest. No fevers, chills, nausea, vomiting, or diarrhea. 05/16/2021 upon evaluation today patient appears to be doing significantly better as compared to last week. I am very pleased with where things stand today. There does not appear to be any signs of infection which is great news and overall very pleased with where we stand. No fevers, chills, nausea, vomiting, or diarrhea. 05/30/2021 upon evaluation today patient appears to be doing well with regard to his legs. He has been tolerating the dressing changes without complication. Fortunately there does not appear to be any signs of active infection which is great news and overall I am extremely pleased with where things stand today. No fevers, chills, nausea, vomiting, or diarrhea. 06/20/2021 upon evaluation today patient actually appears to be making good progress today and very pleased with what we are seeing. I think his legs are really maintaining. As long as we continue wrapping he seems to be doing excellent in my opinion. Fortunately there is no signs of active infection at this time. No fevers, chills, nausea, vomiting, or diarrhea. 07/11/2021 upon evaluation  today patient actually appears to be making excellent progress at this time. Fortunately there does not appear to be any evidence of active infection which is great news and overall I am  extremely pleased with where things stand today. No fevers, chills, nausea, vomiting, or diarrhea. 07/25/2021 upon evaluation today patient appears to be doing well currently in regard to his lower extremities. He has been making good progress here and I do not see anything that is actually open significantly today this is great news. No fevers, chills, nausea, vomiting, or diarrhea. 08/08/2021 upon evaluation today patient appears to be doing well with regard to his wound. He has been tolerating the dressing changes without complication. With that being said unfortunately has a new area that opened up as far as his right posterior leg is concerned this was a blister he also has an area on the third toe right foot which also reopen. Fortunately there is no signs of active infection at this time which is great news. No fevers, chills, nausea, vomiting, or diarrhea. 10/17; patient came in today at his request initially for a nurse visit because it but out of concern for deterioration in both his lower legs and cellulitis I was asked to look at him. He comes in with increased swelling which he says started over the weekend he started to notice pain as well in his left medial ankle, right knee, left knee left dorsal foot. His wraps fell down contributing to some of this but he has not been using his compression pumps over the weekend for reasons that are not really clear. He comes in with multiple new wounds including the right posterior leg, right third toe, right fourth toe, left second toe left medial ankle left dorsal ankle and right anterior lower leg 09/19/2021 upon evaluation today patient does seem to be making improvements in general which is great news. I do not see any evidence of infection currently he does have some hypergranulation of the anterior portion of his ankle on the left side this is going require some debridement to pare this down and then subsequently silver nitrate probably due to  the amount of bleeding that he is probably going to experience. He is in agreement with this plan however. 09/26/2021 upon evaluation today although the patient's legs appear to be doing okay today unfortunately he did have maggots noted during the evaluation as well. This is again quite unfortunate with the respect to the fact that he feels like that he got the wrap wet which was noted today he is not sure when and I think this is what led to the issue. Nonetheless we can try to see what we can do about silly things off so that this does not happen again in the future although he is can have to be diligent about taking care of his wraps as well. 10/03/2021 upon evaluation today patient appears to be doing okay in regard to his legs currently. He unfortunately has maggots again noted on the left foot. This obviously is becoming quite an issue to be perfectly honest. I do think that he needs to see what can be done at home to try to limit this exposure. Last week we had cleaned everything away so this is a new infestation as far as that is concerned. There is really not much that I can do to combat that I am trying to do what we can here in the clinic to wrap his foot and try to prevent any  access of that again with knots there is a small this becomes very difficult. 10/10/2021 upon evaluation today patient appears to be doing poorly in regard to his legs he is very swollen and unfortunately I think a big part of the issue here is simply that he is not taking his fluid pills appropriately stressed probably is not helping much at all either. He still having a lot of issues as far as getting moved and having to be out of his current living condition. With that being said he does have of note maggots noted on the right foot this time completely separate from what we have been seeing he tells me that he got his wraps wet again. Its mainly when it rains it sounds like that he goes out to his car in the area  where he has to park there is a area that collects a fairly large puddle according to what he tells me today either way I explained to him which we have done before as well that if he gets his wraps wet he needs to let us know so we can get them changed out. He does not need to keep them on wet. 10/17/2021 upon evaluation today patient appears to be doing well in regard to his legs. In fact things are significantly better compared to where they were previous. Fortunately I see no evidence of infection currently which is great news. No fevers, chills, nausea, vomiting, or diarrhea. 10/24/2021 upon evaluation today patient appears to be doing awesome in regard to his legs in general. I think everything is showing signs of significant improvement which is great news. There is really only 1 opening on the second toe left foot everything else is closed. 11/07/2020 upon evaluation today patient appears to be doing well with regard to his legs in general he does have a blister on the left foot but otherwise things are doing overall quite well. 11/28/2021 upon evaluation today patient's legs appear to be doing decently well. The biggest issue we see is that he does have some blistering where the wrap seems to have slid down on the right leg near the top. This is just below the knee. That is good to be the biggest issue going forward at this point in my opinion. Otherwise I really feel like he is doing pretty well across the board with the other wound locations previously noted. 12/05/2021 upon evaluation today patient appears to be doing well with regard to the majority of his wounds in fact he has 1 area healed. Unfortunately he has a new area that broken down over the anterior portion of his left ankle. This is quite deep and draining quite a bit. Fortunately I do not see any signs of infection here though on the lateral portion of his ankle there is some erythema and warmth to touch no drainage nothing really to  culture but I am concerned about cellulitis starting up here. 12/12/2021 upon evaluation today patient appears to be doing significantly better in regard to his leg ulcers. Everything is improved as compared to the last time I saw him. I am actually very pleased with where we stand today. The patient is not having any significant pain which is great news. 12/26/2021 upon evaluation patient appears to be doing a little bit more poorly in regard to the wound on his second toe left foot. Unfortunately with the wrap we put on he collect a lot of fluid underneath the underside of the toe and this  has caused this to breakdown to some degree. Fortunately I do not see any evidence of active infection locally or systemically which is great news. No fevers, chills, nausea, vomiting, or diarrhea. Electronic Signature(s) Signed: 12/26/2021 12:02:46 PM By: Lenda Kelp PA-C Entered By: Lenda Kelp on 12/26/2021 12:02:46 -------------------------------------------------------------------------------- Physical Exam Details Patient Name: Date of Service: [facility], [name] 12/26/2021 10:30 A M Medical Record Number: [id number] Patient Account Number: [id number] Date of Birth/Sex: Treating RN: 04-May-1951 (71 y.o. [name] Primary Care Provider: Nicoletta Ba Other Clinician: Referring Provider: Treating Provider/Extender: Adele Dan in Treatment: 309 Constitutional Obese and well-hydrated in no acute distress. Respiratory normal breathing without difficulty. Psychiatric this patient is able to make decisions and demonstrates good insight into disease process. Alert and Oriented x 3. pleasant and cooperative. Notes Upon inspection patient's wound bed showed evidence of good granulation and epithelization at this point. Fortunately I do not see any signs of infection in general and overall I think that his legs are doing much better than when He has the toe which again  is of concern on the left foot second toe but again I feel like this is something we should be able to keep and get under better control fairly quickly. Electronic Signature(s) Signed: 12/26/2021 12:03:09 PM By: Lenda Kelp PA-C Entered By: Lenda Kelp on 12/26/2021 12:03:09 -------------------------------------------------------------------------------- Physician Orders Details Patient Name: Date of Service: [name], [name] 12/26/2021 10:30 A M Medical Record Number: [id number] Patient Account Number: [id number] Date of Birth/Sex: Treating RN: 10/31/1951 (71 y.o. [name] Primary Care Provider: Nicoletta Ba Other Clinician: Referring Provider: Treating Provider/Extender: Adele Dan in Treatment: [phone] Verbal / Phone Orders: No Diagnosis Coding Follow-up Appointments ppointment in 1 week. - Wednesday Algis Liming, RN Room 8 Return A Bathing/ Shower/ Hygiene May shower with protection but do not get wound dressing(s) wet. Edema Control - Lymphedema / SCD / Other Bilateral Lower Extremities Lymphedema Pumps. Use Lymphedema pumps on leg(s) 2-3 times a day for 45-60 minutes. If wearing any wraps or hose, do not remove them. Continue exercising as instructed. Elevate legs to the level of the heart or above for 30 minutes daily and/or when sitting, a frequency of: - throughout the day Avoid standing for long periods of time. Exercise regularly Other Edema Control Orders/Instructions: - right leg 4 layer compression with unna boot to secure to upper portion of lower leg. apply lotion to right leg. Wound Treatment Wound #204 - T Second oe Wound Laterality: Left Peri-Wound Care: Zinc Oxide Ointment 30g tube 1 x Per Week/30 Days Discharge Instructions: Apply Zinc Oxide to periwound with each dressing change Secondary Dressing: Woven Gauze Sponges 2x2 in 1 x Per Week/30 Days Discharge Instructions: Apply over primary dressing as  directed. Wound #211 - Ankle Wound Laterality: Left, Anterior Peri-Wound Care: Sween Lotion (Moisturizing lotion) 1 x Per Week/30 Days Discharge Instructions: Apply moisturizing lotion as directed Prim Dressing: KerraCel Ag Gelling Fiber Dressing, 2x2 in (silver alginate) 1 x Per Week/30 Days ary Discharge Instructions: Apply silver alginate to wound bed as instructed Secondary Dressing: Woven Gauze Sponge, Non-Sterile 4x4 in 1 x Per Week/30 Days Discharge Instructions: Apply over primary dressing as directed. Secondary Dressing: ABD Pad, 8x10 1 x Per Week/30 Days Discharge Instructions: Apply over primary dressing as directed. Compression Wrap: FourPress (4 layer compression wrap) 1 x Per Week/30 Days Discharge Instructions: Apply four layer compression . unna layer at top  to secure wrap Wound #214 - T Second oe Wound Laterality: Left, Posterior Cleanser: Soap and Water 1 x Per Day/30 Days Discharge Instructions: May shower and wash wound with dial antibacterial soap and water prior to dressing change. Peri-Wound Care: Zinc Oxide Ointment 30g tube 1 x Per Day/30 Days Discharge Instructions: Apply Zinc Oxide to periwound with each dressing change Secondary Dressing: Woven Gauze Sponges 2x2 in 1 x Per Day/30 Days Discharge Instructions: Apply 2x2 gauze rolled over zinc oxide Electronic Signature(s) Signed: 12/26/2021 4:57:32 PM By: Lenda Kelp PA-C Signed: 12/26/2021 6:06:00 PM By: Shawn Stall RN, BSN Entered By: Shawn Stall on 12/26/2021 11:29:03 -------------------------------------------------------------------------------- Problem List Details Patient Name: Date of Service: [name], [name] 12/26/2021 10:30 A M Medical Record Number: [id number] Patient Account Number: [id number] Date of Birth/Sex: Treating RN: May 02, 1951 (71 y.o. 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 04/27/2018 No Yes skin L97.829 Non-pressure chronic ulcer of other part of left lower leg with unspecified 03/21/2021 No Yes severity L97.519 Non-pressure chronic ulcer of other part of right foot with unspecified severity 03/21/2021 No Yes L97.812 Non-pressure chronic ulcer of other part of right lower leg with fat layer 08/08/2021 No Yes exposed I87.333 Chronic venous hypertension (idiopathic) with ulcer and inflammation of 01/22/2016 No Yes bilateral lower extremity L03.116 Cellulitis of left lower limb 08/20/2021 No Yes I89.0 Lymphedema, not elsewhere classified 01/22/2016 No Yes E11.40 Type 2 diabetes mellitus with diabetic neuropathy, unspecified 01/22/2016 No Yes E11.622 Type 2 diabetes mellitus with other skin ulcer 01/22/2016 No Yes L03.115 Cellulitis of right lower limb 12/22/2017 No Yes Inactive Problems ICD-10 Code Description Active Date Inactive Date 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 06/30/2018 06/30/2018 L97.511 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 of skin 06/30/2018 06/30/2018 Resolved Problems ICD-10 Code Description Active Date Resolved Date L97.211 Non-pressure chronic ulcer of right calf limited to breakdown of skin 06/30/2018 06/30/2018 L97.221 Non-pressure chronic ulcer of left calf limited to breakdown of skin 09/30/2016 09/30/2016 L03.116 Cellulitis of left lower limb 04/01/2017 04/01/2017 L97.211 Non-pressure chronic ulcer of right calf limited to breakdown of skin 06/30/2017 06/30/2017 Electronic Signature(s) Signed: 12/26/2021 12:01:07 PM By: Lenda Kelp PA-C Entered By: Lenda Kelp on 12/26/2021  12:01:07 -------------------------------------------------------------------------------- Progress Note Details Patient Name: Date of Service: [name], [name] 12/26/2021 10:30 A M Medical Record Number: [id number] Patient Account Number: [id number] Date of Birth/Sex: Treating RN: 07/02/1951 (71 y.o. [name] Primary Care Provider: Nicoletta Ba Other Clinician: Referring Provider: Treating Provider/Extender: Adele Dan in Treatment: 309 Subjective Chief Complaint Information obtained from Patient Patient is here for evaluation of venous/lymphedema ulcers History of Present Illness (HPI) Referred by PCP for consultation. Patient has long standing history of BLE venous stasis, no prior ulcerations. At beginning of month, developed cellulitis and weeping. Received IM Rocephin followed by Keflex and resolved. Wears compression stocking, appr 6 months old. Not sure strength. No present drainage. 01/22/16 this is a patient who is a type II diabetic on insulin. 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 He tells me he religiously wears pressure stockings of uncertain strength. He was here with weeping edema about 8 months ago but did not have  an open wound. Roughly a month ago he had a reopening on his bilateral legs. He is been using bandages and Neosporin. He does not complain of pain. 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 Last BUN/creatinine I have is from 11/20/15 at 13 and 1.0 respectively 01/29/16; patient arrives today having tolerated the Profore wrap. He brought in his stockings and these are 18 mmHg stockings he bought from [employer]. The compression here is likely inadequate. He does not complain of pain or excessive drainage she has no systemic symptoms. The wound on the right looks improved as does the one  on the left although one on the left is more substantial with still tissue at risk below the actual wound area on the bilateral posterior calf 02/05/16; patient arrives with poor edema control. He states that we did put a 4 layer compression on it last week. No weight appear 5 this. 02/12/16; the area on the posterior right Has healed. The left Has a substantial wound that has necrotic surface eschar that requires a debridement with a curette. 02/16/16;the patient called or a Nurse visit secondary to increased swelling. He had been in earlier in the week with his right leg healed. He was transitioned to is on pressure stocking on the right leg with the only open wound on the left, a substantial area on the left posterior calf. Note he has a history of severe lower extremity edema, he has a history of chronic atrial fibrillation but not heart failure per my notes but I'll need to research this. He is not complaining of chest pain shortness of breath or orthopnea. The intake nurse noted blisters on the previously closed right leg 02/19/16; this is the patient's regular visit day. I see him on Friday with escalating edema new wounds on the right leg and clear signs of at least right ventricular heart failure. I increased his Lasix to 40 twice a day. He is returning currently in follow-up. States he is noticed a decrease in that the edema 02/26/16 patient's legs have much less edema. There is nothing really open on the right leg. The left leg has improved condition of the large superficial wound on the posterior left leg 03/04/16; edema control is very much better. The patient's right leg wounds have healed. On the left leg he continues to have severe venous inflammation on the posterior aspect of the left leg. There is no tenderness and I don't think any of this is cellulitis. 03/11/16; patient's right leg is married healed and he is in his own stocking. The patient's left leg has deteriorated somewhat. There is a  lot of erythema around the wound on the posterior left leg. There is also a significant rim of erythema posteriorly just above where the wrap would've ended there is a new wound in this location and a lot of tenderness. Can't rule out cellulitis in this area. 03/15/16; patient's right leg remains healed and he is in his own stocking. The patient's left leg is much better than last review. His major wound on the posterior aspect of his left Is almost fully epithelialized. He has 3 small injuries from the wraps. Really. Erythema seems a lot better on antibiotics 03/18/16; right leg remains healed and he is in his own stocking. The patient's left leg is much better. The area on the posterior aspect of the left calf is fully epithelialized.  His 3 small injuries which were wrap injuries on the left are improved only one seems still open his erythema has resolved 03/25/16; patient's right leg remains healed and he is in his own stocking. There is no open area today on the left leg posterior leg is completely closed up. His wrap injuries at the superior aspect of his leg are also resolved. He looks as though he has some irritation on the dorsal ankle but this is fully epithelialized without evidence of infection. 03/28/16; we discharged this patient on Monday. Transitioned him into his own stocking. There were problems almost immediately with uncontrolled swelling weeping edema multiple some of which have opened. He does not feel systemically unwell in particular no chest pain no shortness of breath and he does not feel 04/08/16; the edema is under better control with the Profore light wrap but he still has pitting edema. There is one large wound anteriorly 2 on the medial aspect of his left leg and 3 small areas on the superior posterior calf. Drainage is not excessive he is tolerating a Profore light well 04/15/16; put a Profore wrap on him last week. This is controlled is edema however he had a lot of pain on  his left anterior foot most of his wounds are healed 04/22/16 once again the patient has denuded areas on the left anterior foot which he states are because his wrap slips up word. He saw his primary physician today is on Lasix 40 twice a day and states that he his weight is down 20 pounds over the last 3 months. 04/29/16: Much improved. left anterior foot much improved. He is now on Lasix 80 mg per day. Much improved edema control 05/06/16; I was hoping to be able to discharge him today however once again he has blisters at a low level of where the compression was placed last week mostly on his left lateral but also his left medial leg and a small area on the anterior part of the left foot. 05/09/16; apparently the patient went home after his appointment on 7/4 later in the evening developing pain in his upper medial thigh together with subjective fever and chills although his temperature was not taken. The pain was so intense he felt he would probably have to call 911. However he then remembered that he had leftover doxycycline from a previous round of antibiotics and took these. By the next morning he felt a lot better. He called and spoke to one of our nurses and I approved doxycycline over the phone thinking that this was in relation to the wounds we had previously seen although they were definitely were not. The patient feels a lot better old fever no chills he is still working. Blood sugars are reasonably controlled 05/13/16; patient is back in for review of his cellulitis on his anterior medial upper thigh. He is taking doxycycline this is a lot better. Culture I did of the nodular area on the dorsal aspect of his foot grew MRSA this also looks a lot better. 05/20/16; the patient is cellulitis on the medial upper thigh has resolved. All of his wound areas including the left anterior foot, areas on the medial aspect of the left calf and the lateral aspect of the calf at all resolved. He has a new  blister on the left dorsal foot at the level of the fourth toe this was excised. No evidence of infection 05/27/16; patient continues to complain weeping edema. He has new blisterlike wounds on the  left anterior lateral and posterior lateral calf at the top of his wrap levels. The area on his left anterior foot appears better. He is not complaining of fever, pain or pruritus in his feet. 05/30/16; the patient's blisters on his left anterior leg posterior calf all look improved. He did not increase the Lasix 100 mg as I suggested because he was going to run out of his 40 mg tablets. He is still having weeping edema of his toes 06/03/16; I renewed his Lasix at 80 mg once a day as he was about to run out when I last saw him. He is on 80 mg of Lasix now. I have asked him to cut down on the excessive amount of water he was drinking and asked him to drink according to his thirst mechanisms 06/12/2016 -- was seen 2 days ago and was supposed to wear his compression stockings at home but he is developed lymphedema and superficial blisters on the left lower extremity and hence came in for a review 06/24/16; the remaining wound is on his left anterior leg. He still has edema coming from between his toes. There is lymphedema here however his edema is generally better than when I last saw this. He has a history of atrial fibrillation but does not have a known history of congestive heart failure nevertheless I think he probably has this at least on a diastolic basis. 07/01/16 I reviewed his echocardiogram from January 2017. This was essentially normal. He did not have LVH, EF of 55-60%. His right ventricular function was normal although he did have trivial tricuspid and pulmonic regurgitation. This is not audible on exam however. I increased his Lasix to do massive edema in his legs well above his knees I think in early July. He was also drinking an excessive amount of water at the time. 07/15/16; missed his appointment  last week because of the Labor Day holiday on Monday. He could not get another appointment later in the week. Started to feel the wrap digging in superiorly so we remove the top half and the bottom half of his wrap. He has extensive erythema and blistering superiorly in the left leg. Very tender. Very swollen. Edema in his foot with leaking edema fluid. He has not been systemically unwell 07/22/16; the area on the left leg laterally required some debridement. The medial wounds look more stable. His wrap injury wounds appear to have healed. Edema and his foot is better, weeping edema is also better. He tells me he is meeting with the supplier of the external compression pumps at work 08/05/16; the patient was on vacation last week in [facility] [place]. His wrap is been on for an extended period of time. Also over the weekend he developed an extensive area of tender erythema across his anterior medial thigh. He took to doxycycline yesterday that he had leftover from a previous prescription. The patient complains of weeping edema coming out of his toes 08/08/16; I saw this patient on 10/2. He was tender across his anterior thigh. I put him on doxycycline. He returns today in follow-up. He does not have any open wounds on his lower leg, he still has edema weeping into his toes. 08/12/16; patient was seen back urgently today to follow-up for his extensive left thigh cellulitis/erysipelas. He comes back with a lot less swelling and erythema pain is much better. I believe I gave him Augmentin and Cipro. His wrap was cut down as he stated a roll down his legs. He developed  blistering above the level of the wrap that remained. He has 2 open blisters and 1 intact. 08/19/16; patient is been doing his primary doctor who is increased his Lasix from 40-80 once a day or 80 already has less edema. Cellulitis has remained improved in the left thigh. 2 open areas on the posterior left calf 08/26/16; he returns today having new  open blisters on the anterior part of his left leg. He has his compression pumps but is not yet been shown how to use some vital representative from the supplier. 09/02/16 patient returns today with no open wounds on the left leg. Some maceration in his plantar toes 09/10/2016 -- Dr. Leanord Hawking had recently discharged him on 09/02/2016 and he has come right back with redness swelling and some open ulcers on his left lower extremity. He says this was caused by trying to apply his compression stockings and he's been unable to use this and has not been able to use his lymphedema pumps. He had some doxycycline leftover and he has started on this a few days ago. 09/16/16; there are no open wounds on his leg on the left and no evidence of cellulitis. 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 He does not complain of symptoms here. I am not clear use using his external compression pumps. 09/23/16; I have not seen this patient in 2 weeks. He canceled his appointment 10 days ago as he was going on vacation. He tells me that on Monday he noticed a large area on his posterior left leg which is been draining copiously and is reopened into a large wound. He is been using ABDs and the external part of his juxtalite, according to our nurse this was not on properly. 10/07/16; Still a substantial area on the posterior left leg. Using silver alginate 10/14/16; in general better although there is still open area which looks healthy. Still using silver alginate. He reminds me that this happen before he left for [facility]. T oday while he was showering in the morning. He had been using his juxtalite's 10/21/16; the area on his posterior left leg is fully epithelialized. However he arrives today with a large area of tender erythema in his medial and posterior left thigh just above the knee. I have marked the area. Once again he is reluctant to consider hospitalization.  I treated him with oral antibiotics in the past for a similar situation with resolution I think with doxycycline however this area it seems more extensive to me. He is not complaining of fever but does have chills and says states he is thirsty. His blood sugar today was in the 140s at home 10/25/16 the area on his posterior left leg is fully epithelialized although there is still some weeping edema. The large area of tenderness and erythema in his medial and posterior left thigh is a lot less tender although there is still a lot of swelling in this thigh. He states he feels a lot better. He is on doxycycline and Augmentin that I started last week. This will continued until Tuesday, December 26. I have ordered a duplex ultrasound of the left thigh rule out DVT whether there is an abscess something that would need to be drained I would also like to know. 11/01/16; he still has weeping edema from a not fully epithelialized area on his left posterior calf. Most of the rest of this looks a lot better. He has completed his antibiotics.  His thigh is a lot better. Duplex ultrasound did not show a DVT in the thigh 11/08/16; he comes in today with more Denuded surface epithelium from the posterior aspect of his calf. There is no real evidence of cellulitis. The superior aspect of his wrap appears to have put quite an indentation in his leg just below the knee and this may have contributed. He does not complain of pain or fever. We have been using silver alginate as the primary dressing. The area of cellulitis in the right thigh has totally resolved. He has been using his compression stockings once a week 11/15/16; the patient arrives today with more loss of epithelium from the posterior aspect of his left calf. He now has a fairly substantial wound in this area. The reason behind this deterioration isn't exactly clear although his edema is not well controlled. He states he feels he is generally more swollen  systemically. He is not complaining of chest pain shortness of breath fever. T me he has an appointment with his primary physician in early February. He is on 80 mg of oral ells Lasix a day. He claims compliance with the external compression pumps. He is not having any pain in his legs similar to what he has with his recurrent cellulitis 11/22/16; the patient arrives a follow-up of his large area on his left lateral calf. This looks somewhat better today. He came in earlier in the week for a dressing change since I saw him a week ago. He is not complaining of any pain no shortness of breath no chest pain 11/28/16; the patient arrives for follow-up of his large area on the left lateral calf this does not look better. In fact it is larger weeping edema. The surface of the wound does not look too bad. We have been using silver alginate although I'm not certain that this is a dressing issue. 12/05/16; again the patient follows up for a large wound on the left lateral and left posterior calf this does not look better. There continues to be weeping edema necrotic surface tissue. More worrisome than this once again there is erythema below the wound involving the distal Achilles and heel suggestive of cellulitis. He is on his feet working most of the day of this is not going well. We are changing his dressing twice a week to facilitate the drainage. 12/12/16; not much change in the overall dimensions of the large area on the left posterior calf. This is very inflamed looking. I gave him an. Doxycycline last week does not really seem to have helped. He found the wrap very painful indeed it seems to of dog into his legs superiorly and perhaps around the heel. He came in early today because the drainage had soaked through his dressings. 12/19/16- patient arrives for follow-up evaluation of his left lower extremity ulcers. He states that he is using his lymphedema pumps once daily when there is "no drainage". He admits  to not using his lipedema pumps while under current treatment. His blood sugars have been consistently between 150-200. 12/26/16; the patient is not using his compression pumps at home because of the wetness on his feet. I've advised him that I think it's important for him to use this daily. He finds his feet too wet, he can put a plastic bag over his legs while he is in the pumps. Otherwise I think will be in a vicious circle. We are using silver alginate to the major area on his left posterior calf  01/02/17; the patient's posterior left leg has further of all into 3 open wounds. All of them covered with a necrotic surface. He claims to be using his compression pumps once a day. His edema control is marginal. Continue with silver alginate 01/10/17; the patient's left posterior leg actually looks somewhat better. There is less edema, less erythema. Still has 3 open areas covered with a necrotic surface requiring debridement. He claims to be using his compression pumps once a day his edema control is better 01/17/17; the patient's left posterior calf look better last week when I saw him and his wrap was changed 2 days ago. He has noted increasing pain in the left heel and arrives today with much larger wounds extensive erythema extending down into the entire heel area especially tender medially. He is not systemically unwell CBGs have been controlled no fever. Our intake nurse showed me limegreen drainage on his AVD pads. 01/24/17; his usual this patient responds nicely to antibiotics last week giving him Levaquin for presumed Pseudomonas. The whole entire posterior part of his leg is much better much less inflamed and in the case of his Achilles heel area much less tender. He has also had some epithelialization posteriorly there are still open areas here and still draining but overall considerably better 01/31/17- He has continue to tolerate the compression wraps. he states that he continues to use the  lymphedema pumps daily, and can increase to twice daily on the weekends. He is voicing no complaints or concerns regarding his LLE ulcers 02/07/17-he is here for follow-up evaluation. He states that he noted some erythema to the left medial and anterior thigh, which he states is new as of yesterday. He is concerned about recurrent cellulitis. He states his blood sugars have been slightly elevated, this morning in the 180s 02/14/17; he is here for follow-up evaluation. When he was last here there was erythema superiorly from his posterior wound in his anterior thigh. He was prescribed Levaquin however a culture of the wound surface grew MRSA over the phone I changed him to doxycycline on Monday and things seem to be a lot better. 02/24/17; patient missed his appointment on Friday therefore we changed his nurse visit into a physician visit today. Still using silver alginate on the large area of the posterior left thigh. He isn't new area on the dorsal left second toe 03/03/17; actually better today although he admits he has not used his external compression pumps in the last 2 days or so because of work responsibilities over the weekend. 03/10/17; continued improvement. External compression pumps once a day almost all of his wounds have closed on the posterior left calf. Better edema control 03/17/17; in general improved. He still has 3 small open areas on the lateral aspect of his left leg however most of the area on the posterior part of his leg is epithelialized. He has better edema control. He has an ABD pad under his stocking on the right anterior lower leg although he did not let us look at that today. 03/24/17; patient arrives back in clinic today with no open areas however there are areas on the posterior left calf and anterior left calf that are less than 100% epithelialized. His edema is well controlled in the left lower leg. There is some pitting edema probably lymphedema in the left upper thigh. He  uses compression pumps at home once per day. I tried to get him to do this twice a day although he is very reticent. 04/01/2017 --  for the last 2 days he's had significant redness, tenderness and weeping and came in for an urgent visit today. 04/07/17; patient still has 6 more days of doxycycline. He was seen by Dr. Meyer Russel last Wednesday for cellulitis involving the posterior aspect, lateral aspect of his Involving his heel. For the most part he is better there is less erythema and less weeping. He has been on his feet for 12 hours o2 over the weekend. Using his compression pumps once a day 04/14/17 arrives today with continued improvement. Only one area on the posterior left calf that is not fully epithelialized. He has intense bilateral venous inflammation associated with his chronic venous insufficiency disease and secondary lymphedema. We have been using silver alginate to the left posterior calf wound In passing he tells [place] today that the right leg but we have not seen in quite some time has an open area on it but he doesn't want [place] to look at this today states he will show this to [place] next week. 04/21/17; there is no open area on his left leg although he still reports some weeping edema. He showed [place] his right leg today which is the first time we've seen this leg in a long time. He has a large area of open wound on the right leg anteriorly healthy granulation. Quite a bit of swelling in the right leg and some degree of venous inflammation. He told us about the right leg in passing last week but states that deterioration in the right leg really only happened over the weekend 04/28/17; there is no open area on the left leg although there is an irritated part on the posterior which is like a wrap injury. The wound on the right leg which was new from last week at least to [place] is a lot better. 05/05/17; still no open area on the left leg. Patient is using his new compression stocking which seems to be doing  a good job of controlling the edema. He states he is using his compression pumps once per day. The right leg still has an open wound although it is better in terms of surface area. Required debridement. A lot of pain in the posterior right Achilles marked tenderness. Usually this type of presentation this patient gives concern for an active cellulitis 05/12/17; patient arrives today with his major wound from last week on the right lateral leg somewhat better. Still requiring debridement. He was using his compression stocking on the left leg however that is reopened with superficial wounds anteriorly he did not have an open wound on this leg previously. He is still using his juxta light's once daily at night. He cannot find the time to do this in the morning as he has to be at work by 7 AM 05/19/17; right lateral leg wound looks improved. No debridement required. The concerning area is on the left posterior leg which appears to almost have a subcutaneous hemorrhagic component to it. We've been using silver alginate to all the wounds 05/26/17; the right lateral leg wound continues to look improved. However the area on the left posterior calf is a tightly adherent surface. [place] using silver alginate. Because of the weeping edema in his legs there is very little good alternatives. 06/02/17; the patient left here last week looking quite good. Major wound on the left posterior calf and a small one on the right lateral calf. Both of these look satisfactory. He tells me that by Wednesday he had noted increased pain in  the left leg and drainage. He called on Thursday and Friday to get an appointment here but we were blocked. He did not go to urgent care or his primary physician. He thinks he had a fever on Thursday but did not actually take his temperature. He has not been using his compression pumps on the left leg because of pain. I advised him to go to the emergency room today for IV antibiotics for stents of  left leg cellulitis but he has refused I have asked him to take 2 days off work to keep his leg elevated and he has refused this as well. In view of this I'm going to call him and Augmentin and doxycycline. 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 However after a detailed discussion regarding the risk benefits and alternatives he refuses to get admitted to the hospital. With no other recourse I will continue him on oral antibiotics as before and hopefully he'll have his infectious disease consultation this week. 06/16/2017 -- the patient was seen today by the nurse practitioner at infectious disease Ms. Dixon. 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 He has also been advise Lotrimin cream locally. she also had wise IV antibiotics for his condition if it flares up 06/23/17; patient arrives today with drainage bilaterally although the remaining wound on the left posterior calf after cleaning up today "highlighter yellow drainage" did not look too bad. Unfortunately he has had breakdown on the right anterior leg [previously this leg had not been open and he is using a black stocking] he went to see infectious disease and is been put on clindamycin 150 mg daily, I did not verify the dose although I'm not familiar with using clindamycin in this dosing range, perhaps for prophylaxisoo 06/27/17; I brought this patient back today to follow-up on the wound deterioration on the right lower leg together with surrounding cellulitis. I started him on doxycycline 4 days ago. This area looks  better however he comes in today with intense cellulitis on the medial part of his left thigh. This is not have a wound in this area. Extremely tender. We've been using silver alginate to the wounds on the right lower leg left lower leg with bilateral 4 layer compression he is using his external compression pumps once a day 07/04/17; patient's left medial thigh cellulitis looks better. He has not been using his compression pumps as his insert said it was contraindicated with cellulitis. His right leg continues to make improvements all the wounds are still open. We only have one remaining wound on the left posterior calf. Using silver alginate to all open areas. He is on doxycycline which I started a week ago and should be finishing I gave him Augmentin after Thursday's visit for the severe cellulitis on the left medial thigh which fortunately looks better 07/14/17; the patient's left medial thigh cellulitis has resolved. The cellulitis in his right lower calf on the right also looks better. All of his wounds are stable to improved we've been using silver alginate he has completed the antibiotics I  have given him. He has clindamycin 150 mg once a day prescribed by infectious disease for prophylaxis, I've advised him to start this now. We have been using bilateral Unna boots over silver alginate to the wound areas 07/21/17; the patient is been to see infectious disease who noted his recurrent problems with cellulitis. He was not able to tolerate prophylactic clindamycin therefore he is on amoxicillin 500 twice a day. He also had a second daily dose of Lasix added By Dr. Oneta Rack but he is not taking this. Nor is he being completely compliant with his compression pumps a especially not this week. He has 2 remaining wounds one on the right posterior lateral lower leg and one on the left posterior medial lower leg. 07/28/17; maintain on Amoxil 500 twice a day as prophylaxis for recurrent cellulitis as ordered by  infectious disease. The patient has Unna boots bilaterally. 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 He has bilateral Unna boots for compression and silver alginate to his wounds. Arrives today with his legs looking as good as I have seen him in quite some time. Not surprisingly his wounds look better as well with improvement on the right lateral leg venous insufficiency wound and also the left medial leg. He is still using the compression pumps once a day 08/11/17; both legs appear to be doing better wounds on the right lateral and left medial legs look better. Skin on the right leg quite good. He is been using silver alginate as the primary dressing. I'm going to use Anasept gel calcium alginate and maintain all the secondary dressings 08/18/17; the patient continues to actually do quite well. The area on his right lateral leg is just about closed the left medial also looks better although it is still moist in this area. His edema is well controlled we have been using Anasept gel with calcium alginate and the usual secondary dressings, 4 layer compression and once daily use of his compression pumps "always been able to manage 09/01/17; the patient continues to do reasonably well in spite of his trip to [place]. The area on the right lateral leg is epithelialized. Left is much better but still open. He has more edema and more chronic erythema on the left leg [venous inflammation] 09/08/17; he arrives today with no open wound on the right lateral leg and decently controlled edema. Unfortunately his left leg is not nearly as in his good situation as last week.he apparently had increasing edema starting on Saturday. He edema soaked through into his foot so used a plastic bag to walk around his home. The area on the medial right leg which was his open  area is about the same however he has lost surface epithelium on the left lateral which is new and he has significant pain in the Achilles area of the left foot. He is already on amoxicillin chronically for prophylaxis of cellulitis in the left leg 09/15/17; he is completed a week of doxycycline and the cellulitis in the left posterior leg and Achilles area is as usual improved. He still has a lot of edema and fluid soaking through his dressings. There is no open wound on the right leg. He saw infectious disease NP today 09/22/17;As usual 1 we transition him from our compression wraps to his stockings things did not go well. He has several small  open areas on the right leg. He states this was caused by the compression wrap on his skin although he did not wear this with the stockings over them. He has several superficial areas on the left leg medially laterally posteriorly. He does not have any evidence of active cellulitis especially involving the left Achilles The patient is traveling from [facility] Saturday going to [facility]. He states he isn't attempting to get an appointment with a heel objects wound center there to change his dressings. I am not completely certain whether this will work 10/06/17; the patient came in on Friday for a nurse visit and the nurse reported that his legs actually look quite good. He arrives in clinic today for his regular follow-up visit. He has a new wound on his left third toe over the PIP probably caused by friction with his footwear. He has small areas on the left leg and a very superficial but epithelialized area on the right anterior lateral lower leg. Other than that his legs look as good as I've seen him in quite some time. We have been using silver alginate Review of systems; no chest pain no shortness of breath other than this a 10 point review of systems negative 10/20/17; seen by Dr. Meyer Russel last week. He had taken some antibiotics [doxycycline] that he had  left over. Dr. Meyer Russel thought he had candida infection and declined to give him further antibiotics. He has a small wound remaining on the right lateral leg several areas on the left leg including a larger area on the left posterior several left medial and anterior and a small wound on the left lateral. The area on the left dorsal third toe looks a lot better. ROS; Gen.; no fever, respiratory no cough no sputum Cardiac no chest pain other than this 10 point review of system is negative 10/30/17; patient arrives today having fallen in the bathtub 3 days ago. It took him a while to get up. He has pain and maceration in the wounds on his left leg which have deteriorated. He has not been using his pumps he also has some maceration on the right lateral leg. 11/03/17; patient continues to have weeping edema especially in the left leg. This saturates his dressings which were just put on on 12/27. As usual the doxycycline seems to take care of the cellulitis on his lower leg. He is not complaining of fever, chills, or other systemic symptoms. He states his leg feels a lot better on the doxycycline I gave him empirically. He also apparently gets injections at his primary doctor's officeo Rocephin for cellulitis prophylaxis. I didn't ask him about his compression pump compliance today I think that's probably marginal. Arrives in the clinic with all of his dressings primary and secondary macerated full of fluid and he has bilateral edema 11/10/17; the patient's right leg looks some better although there is still a cluster of wounds on the right lateral. The left leg is inflamed with almost circumferential skin loss medially to laterally although we are still maintaining anteriorly. He does not have overt cellulitis there is a lot of drainage. He is not using compression pumps. We have been using silver alginate to the wound areas, there are not a lot of options here 11/17/17; the patient's right leg continues to be  stable although there is still open wounds, better than last week. The inflammation in the left leg is better. Still loss of surface layer epithelium especially posteriorly. There is no overt cellulitis in  the amount of edema and his left leg is really quite good, tells me he is using his compression pumps once a day. 11/24/17; patient's right leg has a small superficial wound laterally this continues to improve. The inflammation in the left leg is still improving however we have continuous surface layer epithelial loss posteriorly. There is no overt cellulitis in the amount of edema in both legs is really quite good. He states he is using his compression pumps on the left leg once a day for 5 out of 7 days 12/01/17; very small superficial areas on the right lateral leg continue to improve. Edema control in both legs is better today. He has continued loss of surface epithelialization and left posterior calf although I think this is better. We have been using silver alginate with large number of absorptive secondary dressings 4 layer on the left Unna boot on the right at his request. He tells me he is using his compression pumps once a day 12/08/17; he has no open area on the right leg is edema control is good here. ooOn the left leg however he has marked erythema and tenderness breakdown of skin. He has what appears to be a wrap injury just distal to the popliteal fossa. 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 There was swelling erythema and breakdown in the right leg. He has much worse skin breakdown in the left leg as well multiple open areas medially and posteriorly as well as laterally. He tells me he has been using  his compression pumps but tells me he feels that the drainage out of his leg is worse when he uses a compression pumps. T be fair to him he is been saying this o for a while however I don't know that I have really been listening to this. I wonder if the compression pumps are working properly 12/22/17;. Once again he arrives with severe erythema, weeping edema from the left greater than right leg. Noncompliance with compression pumps. New this visit he is complaining of pain on the lateral aspect of the right leg and the medial aspect of his right thigh. He apparently saw his cardiologist Dr. [name] who was ordered an echocardiogram area and I think this is a step in the right direction 12/25/17; started his doxycycline Monday night. There is still intense erythema of the right leg especially in the anterior thigh although there is less tenderness. The erythema around the wound on the right lateral calf also is less tender. He still complaining of pain in the left heel. His wounds are about the same right lateral left medial left lateral. Superficial but certainly not close to closure. He denies being systemically unwell no fever chills no abdominal pain no diarrhea 12/29/17; back in follow-up of his extensive right calf and right thigh cellulitis. I added amoxicillin to cover possible doxycycline resistant strep. This seems to of done the trick he is in much less pain there is much less erythema and swelling. He has his echocardiogram at 11:00 this morning. X-ray of the left heel was also negative. 01/05/18; the patient arrived with his edema under much better control. Now that he is retired he is able to use his compression pumps daily and sometimes  twice a day per the patient. He has a wound on the right leg the lateral wound looks better. Area on the left leg also looks a lot better. He has no evidence of cellulitis in his bilateral thighs I had a quick peak at his echocardiogram. He is in normal  ejection fraction and normal left ventricular function. He has moderate pulmonary hypertension moderately reduced right ventricular function. One would have to wonder about chronic sleep apnea although he says he doesn't snore. He'll review the echocardiogram with his cardiologist. 01/12/18; the patient arrives with the edema in both legs under exemplary control. He is using his compression pumps daily and sometimes twice daily. His wound on the right lateral leg is just about closed. He still has some weeping areas on the posterior left calf and lateral left calf although everything is just about closed here as well. I have spoken with Aldean Baker who is the patient's nurse practitioner and infectious disease. She was concerned that the patient had not understood that the parenteral penicillin injections he was receiving for cellulitis prophylaxis was actually benefiting him. I don't think the patient actually saw that I would tend to agree we were certainly dealing with less infections although he had a serious one last month. 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 He is healed. He'll be placed in juxtalite compression wraps and increase his lymphedema pumps to twice daily. We will follow up again next week to ensure there are no issues with the new regiment. 01/20/18-he is here for evaluation of bilateral lower extremity weeping edema. Yesterday he was placed in compression wrap to the right lower extremity and compression stocking to left lower shrubbery. He states he uses lymphedema pumps last night and again this morning and noted a blister to the left lower extremity. On exam he was noted to have drainage to the right lower extremity. He will be placed in Unna boots bilaterally and follow-up next week 01/26/18; patient was actually discharged a week ago to his own juxta light stockings only to return the next day with bilateral lower extremity weeping  edema.he was placed in bilateral Unna boots. He arrives today with pain in the back of his left leg. There is no open area on the right leg however there is a linear/wrap injury on the left leg and weeping edema on the left leg posteriorly. I spoke with infectious disease about 10 days ago. They were disappointed that the patient elected to discontinue prophylactic intramuscular penicillin shots as they felt it was particularly beneficial in reducing the frequency of his cellulitis. I discussed this with the patient today. He does not share this view. He'll definitely need antibiotics today. Finally he is traveling to [place] and trauma leaving this Saturday and returning a week later and he does not travel with his pumps. He is going by car 01/30/18; patient was seen 4 days ago and brought back in today for review of cellulitis in the left leg posteriorly. I put him on amoxicillin this really hasn't helped as much as I might like. He is also worried because he is traveling to [facility] trauma by car. Finally we will be rewrapping him. There is no open area on the right leg over his left leg has multiple weeping areas as usual 02/09/18; The same wrap on for 10 days. He did not pick up the last doxycycline I prescribed for him. He apparently took 4 days worth he already had. There is  nothing open on his right leg and the edema control is really quite good. He's had damage in the left leg medially and laterally especially probably related to the prolonged use of Unna boots 02/12/18; the patient arrived in clinic today for a nurse visit/wrap change. He complained of a lot of pain in the left posterior calf. He is taking doxycycline that I previously prescribed for him. Unfortunately even though he used his stockings and apparently used to compression pumps twice a day he has weeping edema coming out of the lateral part of his right leg. This is coming from the lower anterior lateral skin area. 02/16/18; the  patient has finished his doxycycline and will finish the amoxicillin 2 days. The area of cellulitis in the left calf posteriorly has resolved. He is no longer having any pain. He tells me he is using his compression pumps at least once a day sometimes twice. 02/23/18; the patient finished his doxycycline and Amoxil last week. On Friday he noticed a small erythematous circle about the size of a quarter on the left lower leg just above his ankle. This rapidly expanded and he now has erythema on the lateral and posterior part of the thigh. This is bright red. Also has an area on the dorsal foot just above his toes and a tender area just below the left popliteal fossa. He came off his prophylactic penicillin injections at his own insistence one or 2 months ago. This is obviously deteriorated since then 03/02/18; patient is on doxycycline and Amoxil. Culture I did last week of the weeping area on the back of his left calf grew group B strep. I have therefore renewed the amoxicillin 500 3 times a day for a further week. He has not been systemically unwell. Still complaining of an area of discomfort right under his left popliteal fossa. There is no open wound on the right leg. He tells me that he is using his pumps twice a day on most days 03/09/18; patient arrives in clinic today completing his amoxicillin today. The cellulitis on his left leg is better. Furthermore he tells me that he had intramuscular penicillin shots that his primary care office today. However he also states that the wrap on his right leg fell down shortly after leaving clinic last week. He developed a large blister that was present when he came in for a nurse visit later in the week and then he developed intense discomfort around this area.He tells me he is using his compression pumps 03/16/18; the patient has completed his doxycycline. The infectious part of this/cellulitis in the left heel area left popliteal area is a lot better. He has 2  open areas on the right calf. Still areas on the left calf but this is a lot better as well. 03/24/18; the patient arrives complaining of pain in the left popliteal area again. He thinks some of this is wrap injury. He has no open area on the right leg and really no open area on the left calf either except for the popliteal area. He claims to be compliant with the compression pumps 03/31/18; I gave him doxycycline last week because of cellulitis in the left popliteal area. This is a lot better although the surface epithelium is denuded off and response to this. He arrives today with uncontrolled edema in the right calf area as well as a fingernail injury in the right lateral calf. There is only a few open areas on the left 04/06/18; I gave him amoxicillin  doxycycline over the last 2 weeks that the amoxicillin should be completing currently. He is not complaining of any pain or systemic symptoms. The only open areas see has is on the right lateral lower leg paradoxically I cannot see anything on the left lower leg. He tells me he is using his compression pumps twice a day on most days. Silver alginate to the wounds that are open under 4 layer compression 04/13/18; he completed antibiotics and has no new complaints. Using his compression pumps. Silver alginate that anything that's opened 04/20/18; he is using his compression pumps religiously. Silver alginate 4 layer compression anything that's opened. He comes in today with no open wounds on the left leg but 3 on the right including a new one posteriorly. He has 2 on the right lateral and one on the right posterior. He likes Unna boots on the right leg for reasons that aren't really clear we had the usual 4 layer compression on the left. It may be necessary to move to the 4 layer compression on the right however for now I left them in the Unna boots 04/27/18; he is using his compression pumps at least once a day. He has still the wounds on the right lateral  calf. The area right posteriorly has closed. He does not have an open wound on the left under 4 layer compression however on the dorsal left foot just proximal to the toes and the left third toe 2 small open areas were identified 05/11/18; he has not uses compression pumps. The areas on the right lateral calf have coalesced into one large wound necrotic surface. On the left side he has one small wound anteriorly however the edema is now weeping out of a large part of his left leg. He says he wasn't using his pumps because of the weeping fluid. I explained to him that this is the time he needs to pump more 05/18/18; patient states he is using his compression pumps twice a day. The area on the right lateral large wound albeit superficial. On the left side he has innumerable number of small new wounds on the left calf particularly laterally but several anteriorly and medially. All these appear to have healthy granulated base these look like the remnants of blisters however they occurred under compression. The patient arrives in clinic today with his legs somewhat better. There is certainly less edema, less multiple open areas on the left calf and the right anterior leg looks somewhat better as well superficial and a little smaller. However he relates pain and erythema over the last 3-4 days in the thigh and I looked at this today. He has not been systemically unwell no fever no chills no change in blood sugar values 05/25/18; comes in today in a better state. The severe cellulitis on his left leg seems better with the Keflex. Not as tender. He has not been systemically unwell ooHard to find an open wound on the left lower leg using his compression pumps twice a day ooThe confluent wounds on his right lateral calf somewhat better looking. These will ultimately need debridement I didn't do this today. 06/01/18; the severe cellulitis on the left anterior thigh has resolved and he is completed his  Keflex. ooThere is no open wound on the left leg however there is a superficial excoriation at the base of the third toe dorsally. Skin on the bottom of his left foot is macerated looking. ooThe left the wounds on the lateral right leg actually looks  some better although he did require debridement of the top half of this wound area with an open curet 06/09/18 on evaluation today patient appears to be doing poorly in regard to his right lower extremity in particular this appears to likely be infected he has very thick purulent discharge along with a bright green tent to the discharge. This makes me concerned about the possibility of pseudomonas. He's also having increased discomfort at this point on evaluation. Fortunately there does not appear to be any evidence of infection spreading to the other location at this time. 06/16/18 on evaluation today patient appears to actually be doing fairly well. His ulcer has actually diminished in size quite significantly at this point which is good news. Nonetheless he still does have some evidence of infection he did see infectious disease this morning before coming here for his appointment. I did review the results of their evaluation and their note today. They did actually have him discontinue the Cipro and initiate treatment with linezolid at this time. He is doing this for the next seven days and they recommended a follow-up in four months with them. He is the keep a log of the need for intermittent antibiotic therapy between now and when he falls back up with infectious disease. This will help them gaze what exactly they need to do to try and help them out. 06/23/18; the patient arrives today with no open wounds on the left leg and left third toe healed. He is been using his compression pumps twice a day. On the right lateral leg he still has a sizable wound but this is a lot better than last time I saw this. In my absence he apparently cultured MRSA coming  from this wound and is completed a course of linezolid as has been directed by infectious disease. Has been using silver alginate under 4 layer compression 06/30/18; the only open wound he has is on the right lateral leg and this looks healthy. No debridement is required. We have been using silver alginate. He does not have an open wound on the left leg. There is apparently some drainage from the dorsal proximal third toe on the left although I see no open wound here. 07/03/18 on evaluation today patient was actually here just for a nurse visit rapid change. However when he was here on Wednesday for his rat change due to having been healed on the left and then developing blisters we initiated the wrap again knowing that he would be back today for [place] to reevaluate and see were at. Unfortunately he has developed some cellulitis into the proximal portion of his right lower extremity even into the region of his thigh. 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 He was placed on one as what at that point. Nonetheless he is done with that and has been tolerating it well otherwise. Doxycycline which in the past really did not seem to be effective for him. Nonetheless I think the best option may be for [place] to definitely reinitiate the antibiotics for a longer period of time. 07/07/18; since I last saw this patient a week ago he has had a difficult time. At that point he did not have an open wound on his left leg. We transitioned him into juxta light stockings. He was apparently in the clinic the next day with blisters on the left lateral and left medial lower calf. He also had weeping edema fluid. He was put  back into a compression wrap. He was also in the clinic on Friday with intense erythema in his right thigh. Per the patient he was started on Bactrim however that didn't work at all in terms of relieving his pain and swelling. He has taken 3 doxycycline that he had left  over from last time and that seems to of helped. He has blistering on the right thigh as well. 07/14/18; the erythema on his right thigh has gotten better with doxycycline that he is finishing. 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 Presumably this cellulitis in the thigh was not related to that although I think this is a bit concerning going forward. He still has an area on the right lateral calf the blister on the right medial calf just below the knee that was discussed above. On the left 2 small open areas left medial and left lateral. Edema control is adequate. He is using his compression pumps twice a day 07/20/18; continued improvement in the condition of both legs especially the edema in his bilateral thighs. He tells me he is been losing weight through a combination of diet and exercise. He is using his compression pumps twice a day. So overall she made to the remaining wounds 07/27/2018; continued improvement in condition of both legs. His edema is well controlled. The area on the right lateral leg is just about closed he had one blisters show up on the medial left upper calf. We have him in 4 layer compression. He is going on a 10-day trip to [place], [place] and [place]. He will be driving. He wants to wear Unna boots because of the lessening amount of constriction. He will not use compression pumps while he is away 08/05/18 on evaluation today patient actually appears to be doing decently well all things considered in regard to his bilateral lower extremities. The worst ulcer is actually only posterior aspect of his left lower extremity with a four layer compression wrap cut into his leg a couple weeks back. He did have a trip and actually had [employer] for the trip that he is worn since he was last here. Nonetheless he feels like the [employer] actually do better for him his swelling is up a  little bit but he also with his trip was not taking his Lasix on a regular set schedule like he was supposed to be. He states that obviously the reason being that he cannot drive and keep going without having to urinate too frequently which makes it difficult. He did not have his pumps with him while he was away either which I think also maybe playing a role here too. 08/13/2018; the patient only has a small open wound on the right lateral calf which is a big improvement in the last month or 2. He also has the area posteriorly just below the posterior fossa on the left which I think was a wrap injury from several weeks ago. He has no current evidence of cellulitis. He tells me he is back into his compression pumps twice a day. He also tells me that while he was at the laundromat somebody stole a section of his extremitease stockings 08/20/2018; back in the clinic with a much improved state. He only has small areas on the right lateral mid calf which is just about healed. This was is more substantial area for quite a prolonged period  of time. He has a small open area on the left anterior tibia. The area on the posterior calf just below the popliteal fossa is closed today. He is using his compression pumps twice a day 08/28/2018; patient has no open wound on the right leg. He has a smattering of open areas on the calf with some weeping lymphedema. More problematically than that it looks as though his wraps of slipped down in his usual he has very angry upper area of edema just below the right medial knee and on the right lateral calf. He has no open area on his feet. The patient is traveling to [facility] next week. I will send him in an antibiotic. We will continue to wrap the right leg. We ordered extremitease stockings for him last week and I plan to transition the right leg to a stocking when he gets home which will be in 10 days time. As usual he is very reluctant to take his pumps with him when  he travels 09/07/2018; patient returns from [facility]. He shows me a picture of his left leg in the mid part of his trip last week with intense fire engine erythema. The picture look bad enough I would have considered sending him to the hospital. Instead he went to the wound care center in [facility]. They did not prescribe him antibiotics but he did take some doxycycline he had leftover from a previous visit. I had given him trimethoprim sulfamethoxazole before he left this did not work according to the patient. This is resulted in some improvement fortunately. He comes back with a large wound on the left posterior calf. Smaller area on the left anterior tibia. Denuded blisters on the dorsal left foot over his toes. Does not have much in the way of wounds on the right leg although he does have a very tender area on the right posterior area just below the popliteal fossa also suggestive of infection. He promises me he is back on his pumps twice a day 09/15/2018; the intense cellulitis in his left lower calf is a lot better. The wound area on the posterior left calf is also so better. However he has reasonably extensive wounds on the dorsal aspect of his second and third toes and the proximal foot just at the base of the toes. There is nothing open on the right leg 09/22/2018; the patient has excellent edema control in his legs bilaterally. He is using his external compression pumps twice a day. He has no open area on the right leg and only the areas in the left foot dorsally second and third toe area on the left side. He does not have any signs of active cellulitis. 10/06/2018; the patient has good edema control bilaterally. He has no open wound on the right leg. There is a blister in the posterior aspect of his left calf that we had to deal with today. He is using his compression pumps twice a day. There is no signs of active cellulitis. We have been using silver alginate to the wound  areas. He still has vulnerable areas on the base of his left first second toes dorsally He has a his extremities stockings and we are going to transition him today into the stocking on the right leg. He is cautioned that he will need to continue to use the compression pumps twice a day. If he notices uncontrolled edema in the right leg he may need to go to 3 times a day.  10/13/2018; the patient came in for a nurse check on Friday he has a large flaccid blister on the right medial calf just below the knee. We unroofed this. He has this and a new area underneath the posterior mid calf which was undoubtedly a blister as well. He also has several small areas on the right which is the area we put his extremities stocking on. 10/19/2018; the patient went to see infectious disease this morning I am not sure if that was a routine follow-up in any case the doxycycline I had given him was discontinued and started on linezolid. He has not started this. It is easy to look at his left calf and the inflammation and think this is cellulitis however he is very tender in the tissue just below the popliteal fossa and I have no doubt that there is infection going on here. He states the problem he is having is that with the compression pumps the edema goes down and then starts walking the wrap falls down. We will see if we can adhere this. He has 1 or 2 minuscule open areas on the right still areas that are weeping on the posterior left calf, the base of his left second and third toes 10/26/18; back today in clinic with quite of skin breakdown in his left anterior leg. This may have been infection the area below the popliteal fossa seems a lot better however tremendous epithelial loss on the left anterior mid tibia area over quite inexpensive tissue. He has 2 blisters on the right side but no other open wound here. 10/29/2018; came in urgently to see us today and we worked him in for review. He states that the 4 layer  compression on the right leg caused pain he had to cut it down to roughly his mid calf this caused swelling above the wrap and he has blisters and skin breakdown today. As a result of the pain he has not been using his pumps. Both legs are a lot more edematous and there is a lot of weeping fluid. 11/02/18; arrives in clinic with continued difficulties in the right leg> left. Leg is swollen and painful. multiple skin blisters and new open areas especially laterally. He has not been using his pumps on the right leg. 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 He is not systemically unwell. 11/09/2018; the patient claims he is being compliant with his pumps. He is finished the doxycycline I gave him last week. Culture I did of the wound on the right lateral leg showed a few very resistant methicillin staph aureus. This was resistant to doxycycline. Nevertheless he states the pain in the leg is a lot better which makes me wonder if the cultured organism was not really what was causing the problem nevertheless this is a very dangerous organism to be culturing out of any wound. His right leg is still a lot larger than the left. He is using an [employer] boot on this area, he blames a 4-layer compression for causing the original skin breakdown which I doubt is true however I cannot talk him out of it. We have been using silver alginate to all of these areas which were initially blisters 11/16/2018; patient is being compliant with his external compression pumps at twice a day. Miraculously he arrives in clinic today with absolutely no open wounds. He has better edema control on the left where he has been using 4 layer compression versus wound of wounds  on the right and I pointed this out to him. There is no inflammation in the skin in his lower legs which is also somewhat unusual for him. There is no open wounds on the dorsal left foot. He has extremitease stockings at home  and I have asked him to bring these in next week. 11/25/18 patient's lower extremity on examination today on the left appears for the most part to be wound free. He does have an open wound on the lateral aspect of the right lower extremity but this is minimal compared to what I've seen in past. He does request that we go ahead and wrap the left leg as well even though there's nothing open just so hopefully it will not reopen in short order. 1/28; patient has superficial open wounds on the right lateral calf left anterior calf and left posterior calf. His edema control is adequate. 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 We have been using silver alginate as the primary dressing. He claims compliance with his compression pumps 2/4; patient has superficial open wounds on numerous areas of his left calf and again one on the left dorsal foot. The areas on the right lateral calf have healed. The cellulitis that I gave him doxycycline for last week is also resolved this was mostly on the left anterior calf just below the tibial tuberosity. His edema looks fairly well-controlled. He tells me he went to see his primary doctor today and had blood work ordered 2/11; once again he has several open areas on the left calf left tibial area. Most of these are small and appear to have healthy granulation. He does not have anything open on the right. The edema and control in his thighs is pretty good which is usually a good indication he has been using his pumps as requested. 2/18; he continues to have several small areas on the left calf and left tibial area. Most of these are small healthy granulation. We put him in his stocking on the right leg last week and he arrives with a superficial open area over the right upper tibia and a fairly large area on the right lateral tibia in similar condition. His edema control actually does  not look too bad, he claims to be using his compression pumps twice a day 2/25. Continued small areas on the left calf and left tibial area. New areas especially on the right are identified just below the tibial tuberosity and on the right upper tibia itself. There are also areas of weeping edema fluid even without an obvious wound. He does not have a considerable degree of lymphedema but clearly there is more edema here than his skin can handle. He states he is using the pumps twice a day. We have an Unna boot on the right and 4 layer compression on the left. 3/3; he continues to have an area on the right lateral calf and right posterior calf just below the popliteal fossa. There is a fair amount of tenderness around the wound on the popliteal fossa but I did not see any evidence of cellulitis, could just be that the wrap came down and rubbed in this area. ooHe does not have an open area on the left leg however there is an area on the left dorsal foot at the base of the third toe ooWe have been using silver alginate to all wound areas 3/10; he did not  have an open area on his left leg last time he was here a week ago. T oday he arrives with a horizontal wound just below the tibial tuberosity and an area on the left lateral calf. He has intense erythema and tenderness in this area. The area is on the right lateral calf and right posterior calf better than last week. We have been using silver alginate as usual 3/18 - Patient returns with 3 small open areas on left calf, and 1 small open area on right calf, the skin looks ok with no significant erythema, he continues the UNA boot on right and 4 layer compression on left. The right lateral calf wound is closed , the right posterior is small area. we will continue silver alginate to the areas. 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 He is been tolerating the dressing changes without complication. Fortunately she has made excellent improvement in regard to the overall status of his wounds. Unfortunately every time we cease wrapping him he ends up reopening in causing more significant issues at that point. Again I'm unsure of the best direction to take although I think the lymphedema clinic may be appropriate for him. 02/03/19 on evaluation today patient appears to be doing well in regard to the wounds that we saw him for last week unfortunately he has a new area on the proximal portion of his right medial/posterior lower extremity where the wrap somewhat slowed down and caused swelling and a blister to rub and open. Unfortunately this is the only opening that he has on either leg at this point. 02/17/19 on evaluation today patient's bilateral lower extremities appear to be doing well. He still completely healed in regard to the left lower extremity. In regard to the right lower extremity the area where the wrap and slid down and caused the blister still seems to be slightly open although this is dramatically better than during the last evaluation two weeks ago. I'm very pleased with the way this stands overall. 03/03/19 on evaluation today patient appears to be doing well in regard to his right lower extremity in general although he did have a new blister open this does not appear to be showing any evidence of active infection at this time. Fortunately there's No fevers, chills, nausea, or vomiting noted at this time. Overall I feel like he is making good progress it does feel like that the right leg will we perform the [employer] seems to do with a bit better than three layer wrap on the left which slid down on him. We may switch to doing bilateral in the book wraps. 5/4; I have not seen Mr. Estupinan in quite some time. According to our case manager he  did not have an open wound on his left leg last week. He had 1 remaining wound on the right posterior medial calf. He arrives today with multiple openings on the left leg probably were blisters and/or wrap injuries from Unna boots. I do not think the Unna boot's will provide adequate compression on the left. I am also not clear about the frequency he is using the compression pumps. 03/17/19 on evaluation today patient appears to be doing excellent in regard to his lower extremities compared to last week's evaluation apparently. He had gotten significantly worse last week which  is unfortunate. The [employer] wrap on the left did not seem to do very well for him at all and in fact it didn't control his swelling significantly enough he had an additional outbreak. Subsequently we go back to the four layer compression wrap on the left. This is good news. At least in that he is doing better and the wound seem to be killing him. He still has not heard anything from the lymphedema clinic. 03/24/19 on evaluation today patient actually appears to be doing much better in regard to his bilateral lower [place] as compared to last week when I saw him. Fortunately there's no signs of active infection at this time. He has been tolerating the dressing changes without complication. Overall I'm extremely pleased with the progress and appearance in general. 04/07/19 on evaluation today patient appears to be doing well in regard to his bilateral lower extremities. His swelling is significantly down from where it was previous. With that being said he does have a couple blisters still open at this point but fortunately nothing that seems to be too severe and again the majority of the larger openings has healed at this time. 04/14/19 on evaluation today patient actually appears to be doing quite well in regard to his bilateral lower extremities in fact I'm not even sure there's anything significantly open at this time at any site.  Nonetheless he did have some trouble with these wraps where they are somewhat irritating him secondary to the fact that he has noted that the graph wasn't too close down to the end of this foot in a little bit short as well up to his knee. Otherwise things seem to be doing quite well. 04/21/19 upon evaluation today patient's wound bed actually showed evidence of being completely healed in regard to both lower extremities which is excellent news. There does not appear to be any signs of active infection which is also good news. I'm very pleased in this regard. No fevers, chills, nausea, or vomiting noted at this time. 04/28/19 on evaluation today patient appears to be doing a little bit worse in regard to both lower extremities on the left mainly due to the fact that when he went infection disease the wrap was not wrapped quite high enough he developed a blister above this. On the right he is a small open area of nothing too significant but again this is continuing to give him some trouble he has been were in the Velcro compression that he has at home. 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 He's been tolerating the dressing changes without complication. Fortunately there's no signs of active infection at this time. No fevers, chills, nausea, or vomiting noted at this time. We have been trying to get an appointment with her lymphedema clinic in [facility] but unfortunately nobody can get them on phone with not been able to even fax information over the patient likewise is not been able to get in touch with them. Overall I'm not sure exactly what's going on here with to reach out again today. 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 Still having a lot of drainage unfortunately. He tells me especially in the left but even on the right.  There's no signs of active infection which is good news we've been using so ratcheted up to this point. 05/19/19 on evaluation today  patient actually appears to be doing quite well with regard to his left lower extremity which is great news. Fortunately in regard to the right lower extremity has an issues with his wrap and he subsequently did remove this from what I'm understanding. Nonetheless long story short is what he had rewrapped once he removed it subsequently had maggots underneath this wrap whenever he came in for evaluation today. With that being said they were obviously completely cleaned away by the nursing staff. The visit today which is excellent news. However he does appear to potentially have some infection around the right ankle region where the maggots were located as well. He will likely require anabiotic therapy today. 05/26/19 on evaluation today patient actually appears to be doing much better in regard to his bilateral lower extremities. I feel like the infection is under much better control. With that being said there were maggots noted when the wrap was removed yet again today. Again this could have potentially been left over from previous although at this time there does not appear to be any signs of significant drainage there was obviously on the wrap some drainage as well this contracted gnats or otherwise. Either way I do not see anything that appears to be doing worse in my pinion and in fact I think his drainage has slowed down quite significantly likely mainly due to the fact to his infection being under better control. 06/02/2019 on evaluation today patient actually appears to be doing well with regard to his bilateral lower extremities there is no signs of active infection at this time which is great news. With that being said he does have several open areas more so on the right than the left but nonetheless these are all significantly better than previously  noted. 06/09/2019 on evaluation today patient actually appears to be doing well. His wrap stayed up and he did not cause any problems he had more drainage on the right compared to the left but overall I do not see any major issues at this time which is great news. 06/16/2019 on evaluation today patient appears to be doing excellent with regard to his lower extremities the only area that is open is a new blister that can have opened as of today on the medial ankle on the left. Other than this he really seems to be doing great I see no major issues at this point. 06/23/2019 on evaluation today patient appears to be doing quite well with regard to his bilateral lower extremities. In fact he actually appears to be almost completely healed there is a small area of weeping noted of the right lower extremity just above the ankle. Nonetheless fortunately there is no signs of active infection at this time which is good news. No fevers, chills, nausea, vomiting, or diarrhea. 8/24; the patient arrived for a nurse visit today but complained of very significant pain in the left leg and therefore I was asked to look at this. Noted that he did not have an open area on the left leg last week nevertheless this was wrapped. The patient states that he is not been able to put his compression pumps on the left leg because of the discomfort. He has not been systemically unwell 06/30/2019 on evaluation today patient unfortunately despite being excellent last week is doing much worse with regard to his left lower extremity today. In fact he had to come in for a nurse on Monday where his left leg had to be rewrapped due to  excessive weeping Dr. Leanord Hawking placed him on doxycycline at that point. Fortunately there is no signs of active infection Systemically at this time which is good news. 07/07/2019 in regard to the patient's wounds today he actually seems to be doing well with his right lower extremity there really is nothing open or  draining at this point this is great news. Unfortunately the left lower extremity is given him additional trouble at this time. There does not appear to be any signs of active infection nonetheless he does have a lot of edema and swelling noted at this point as well as blistering all of which has led to a much more poor appearing leg at this time compared to where it was 2 weeks ago when it was almost completely healed. Obviously this is a little discouraging for the patient. He is try to contact the lymphedema clinic in [place] he has not been able to get through to them. 07/14/2019 on evaluation today patient actually appears to be doing slightly better with regard to his left lower extremity ulcers. Overall I do feel like at least at the top of the wrap that we have been placing this area has healed quite nicely and looks much better. The remainder of the leg is showing signs of improvement. Unfortunately in the thigh area he still has an open region on the left and again on the right he has been utilizing just a Band-Aid on an area that also opened on the thigh. Again this is an area that were not able to wrap although we did do an Ace wrap to provide some compression that something that obviously is a little less effective than the compression wraps we have been using on the lower portion of the leg. He does have an appointment with the lymphedema clinic in [facility] on Friday. 07/21/2019 on evaluation today patient appears to be doing better with regard to his lower extremity ulcers. He has been tolerating the dressing changes without complication. Fortunately there is no signs of active infection at this time. No fevers, chills, nausea, vomiting, or diarrhea. I did receive the paperwork from the physical therapist at the lymphedema clinic in [place]. Subsequently I signed off on that this morning and sent that back to him for further progression with the treatment plan. 07/28/2019 on  evaluation today patient appears to be doing very well with regard to his right lower extremity where I do not see any open wounds at this point. Fortunately he is feeling great as far as that is concerned as well. In regard to the left lower extremity he has been having issues with still several areas of weeping and edema although the upper leg is doing better his lower leg still I think is going require the compression wrap at this time. No fevers, chills, nausea, vomiting, or diarrhea. 08/04/2019 on evaluation today patient unfortunately is having new wounds on the right lower extremity. Again we have been using Unna boot wrap on that side. We switched him to using his juxta lite wrap at home. With that being said he tells me he has been using it although his legs extremely swollen and to be honest really does not appear that he has been. I cannot know that for sure however. Nonetheless he has multiple new wounds on the right lower extremity at this time. Obviously we will have to see about getting this rewrapped for him today. 08/11/2019 on evaluation today patient appears to be doing fairly well with regard to  his wounds. He has been tolerating the dressing changes including the compression wraps without complication. He still has a lot of edema in his upper thigh regions bilaterally he is supposed to be seeing the lymphedema clinic on the 15th of this month once his wraps arrive for the upper part of his legs. 08/18/2019 on evaluation today patient appears to be doing well with regard to his bilateral lower extremities at this point. He has been tolerating the dressing changes without complication. Fortunately there is no signs of active infection which is also good news. He does have a couple weeping areas on the first and second toe of the right foot he also has just a small area on the left foot upper leg and a small area on the left lower leg but overall he is doing quite well in my opinion. He  is supposed to be getting his wraps shortly in fact tomorrow and then subsequently is seeing the lymphedema clinic next Wednesday on the 21st. Of note he is also leaving on the 25th to go on vacation for a week to the beach. For that reason and since there is some uncertainty about what there can be doing at lymphedema clinic next Wednesday I am get a make an appointment for next Friday here for [place] to see what we need to do for him prior to him leaving for vacation. 10/23; patient arrives in considerable pain predominantly in the upper posterior calf just distal to the popliteal fossa also in the wound anteriorly above the major wound. This is probably cellulitis and he has had this recurrently in the past. He has no open wound on the right side and he has had an Radio broadcast assistant in that area. Finally I note that he has an area on the left posterior calf which by enlarge is mostly epithelialized. This protrudes beyond the borders of the surrounding skin in the setting of dry scaly skin and lymphedema. The patient is leaving for [facility] on Sunday. Per his longstanding pattern, he will not take his compression pumps with him predominantly out of fear that they will be stolen. He therefore asked that we put a Unna boot back on the right leg. He will also contact the wound care center in [facility] to see if they can change his dressing in the mid week. 11/3; patient returned from his vacation to [facility]. He was seen on 1 occasion at their wound care center. They did a 2 layer compression system as they did not have our 4-layer wrap. I am not completely certain what they put on the wounds. They did not change the Unna boot on the right. The patient is also seeing a lymphedema specialist physical therapist in [place]. It appears that he has some compression sleeve for his thighs which indeed look quite a bit better than I am used to seeing. He pumps over these with his external compression  pumps. 11/10; the patient has a new wound on the right medial thigh otherwise there is no open areas on the right. He has an area on the left leg posteriorly anteriorly and medially and an area over the left second toe. We have been using silver alginate. He thinks the injury on his thigh is secondary to friction from the compression sleeve he has. 11/17; the patient has a new wound on the right medial thigh last week. He thinks this is because he did not have a underlying stocking for his thigh juxta lite apparatus.  He now has this. The area is fairly large and somewhat angry but I do not think he has underlying cellulitis. ooHe has a intact blister on the right anterior tibial area. ooSmall wound on the right great toe dorsally ooSmall area on the medial left calf. 11/30; the patient does not have any open areas on his right leg and we did not take his juxta lite stocking off. However he states that on Friday his compression wrap fell down lodging around his upper mid calf area. As usual this creates a lot of problems for him. He called urgently today to be seen for a nurse visit however the nurse visit turned into a provider visit because of extreme erythema and pain in the left anterior tibia extending laterally and posteriorly. The area that is problematic is extensive 10/06/2019 upon evaluation today patient actually appears to be doing poorly in regard to his left lower extremity. He Dr. Leanord Hawking did place him on doxycycline this past Monday apparently due to the fact that he was doing much worse in regard to this left leg. Fortunately the doxycycline does seem to be helping. Unfortunately we are still having a very difficult time getting his edema under any type of control in order to anticipate discharge at some point. The only way were really able to control his lymphedema really is with compression wraps and that has only even seemingly temporary. He has been seeing a lymphedema clinic  they are trying to help in this regard but still this has been somewhat frustrating in general for the patient. 10/13/19 on evaluation today patient appears to be doing excellent with regard to his right lower extremity as far as the wounds are concerned. His swelling is still quite extensive unfortunately. He is still having a lot of drainage from the thigh areas bilaterally which is unfortunate. He's been going to lymphedema clinic but again he still really does not have this edema under control as far as his lower extremities are concern. With regard to his left lower extremity this seems to be improving and I do believe the doxycycline has been of benefit for him. He is about to complete the doxycycline. 10/20/2019 on evaluation today patient appears to be doing poorly in regard to his bilateral lower extremities. More in the right thigh he has a lot of irritation at this site unfortunately. In regard to the left lower extremity the wrap was not quite as high it appears and does seem to have caused him some trouble as well. Fortunately there is no evidence of systemic infection though he does have some blue-green drainage which has me concerned for the possibility of Pseudomonas. He tells me he is previously taking Cipro without complications and he really does not care for Levaquin however due to some of the side effects he has. He is not allergic to any medications specifically antibiotics that were aware of. 10/27/2019 on evaluation today patient actually does appear to be for the most part doing better when compared to last week's evaluation. With that being said he still has multiple open wounds over the bilateral lower extremities. He actually forgot to start taking the Cipro and states that he still has the whole bottle. He does have several new blisters on left lower extremity today I think I would recommend he go ahead and take the Cipro based on what I am seeing at  this point. 12/30-Patient comes at 1 week visit, 4 layer compression wraps on the left and Unna boot  on the right, primary dressing Xtrasorb and silver alginate. Patient is taking his Cipro and has a few more days left probably 5-6, and the legs are doing better. He states he is using his compressions devices which I believe he has 11/10/2019 on evaluation today patient actually appears to be much better than last time I saw him 2 weeks ago. His wounds are significantly improved and overall I am very pleased in this regard. Fortunately there is no signs of active infection at this time. He is just a couple of days away from completing Cipro. Overall his edema is much better he has been using his lymphedema pumps which I think is also helping at this point. 11/17/2019 on evaluation today patient appears to be doing excellent in regard to his wounds in general. His legs are swollen but not nearly as much as they have been in the past. Fortunately he is tolerating the compression wraps without complication. No fevers, chills, nausea, vomiting, or diarrhea. He does have some erythema however in the distal portion of his right lower extremity specifically around the forefoot and toes there is a little bit of warmth here as well. 11/24/2019 on evaluation today patient appears to be doing well with regard to his right lower extremity I really do not see any open wounds at this point. His left lower extremity does have several open areas and his right medial thigh also is open. Other than this however overall the patient seems to be making good progress and I am very pleased at this point. 12/01/2019 on evaluation today patient appears to be doing poorly at this point in regard to his left lower extremity has several new blisters despite the fact that we have him in compression wraps. In fact he had a 4-layer compression wrap, his upper thigh wrapped from lymphedema clinic, and a juxta light over top of the 4  layer compression wrap the lymphedema clinic applied and despite all this he still develop blisters underneath. Obviously this does have me concerned about the fact that unfortunately despite what we are doing to try to get wounds healed he continues to have new areas arise I do not think he is ever good to be at the point where he can realistically just use wraps at home to keep things under control. Typically when we heal him it takes about 1-2 days before he is back in the clinic with severe breakdown and blistering of his lower extremities bilaterally. This is happened numerous times in the past. Unfortunately I think that we may need some help as far as overall fluid overload to kind of limit what we are seeing and get things under better control. 12/08/2019 on evaluation today patient presents for follow-up concerning his ongoing bilateral lower extremity edema. Unfortunately he is still having quite a bit of swelling the compression wraps are controlling this to some degree but he did see Dr. [name] his cardiologist I do have that available for review today as far as the appointment was concerned that was on 12/06/2019. Obviously that she has been 2 days ago. The patient states that he is only been taking the Lasix 80 mg 1 time a day he had told me previously he was taking this twice a day. Nonetheless Dr. [name] recommended this be up to 80 mg 2 times a day for the patient as he did appear to be fluid overloaded. With that being said the patient states he did this yesterday and he was unable to  go anywhere or do anything due to the fact that he was constantly having to urinate. Nonetheless I think that this is still good to be something that is important for him as far as trying to get his edema under control at all things that he is going to be able to just expect his wounds to get under control and things to be better without going through at least a period of time where he is trying to stabilize his  fluid management in general and I think increasing the Lasix is likely the first step here. It was also mentioned the possibility that the patient may require metolazone. With that being said he wanted to have the patient take Lasix twice a day first and then reevaluating 2 months to see where things stand. 12/15/2019 upon evaluation today patient appears to be doing regard to his legs although his toes are showing some signs of weeping especially on the left at this point to some degree on the right. There does not appear to be any signs of active infection and overall I do feel like the compression wraps are doing well for him but he has not been able to take the Lasix at home and the increased dose that Dr. [name] recommended. He tells me that just not go to be feasible for him. Nonetheless I think in this case he should probably send a message to Dr. [name] in order to discuss options from the standpoint of possible admission to get the fluid off or otherwise going forward. 12/22/2019 upon evaluation today patient appears to be doing fairly well with regard to his lower extremities at this point. In fact he would be doing excellent if it was not for the fact that his right anterior thigh apparently had an allergic reaction to adhesive tape that he used. The wound itself that we have been monitoring actually appears to be healed. There is a lot of irritation at this point. 12/29/2019 upon evaluation today patient appears to be doing well in regard to his lower extremities. His left medial thigh is open and somewhat draining today but this is the only region that is open the right has done much better with the treatment utilizing the steroid cream that I prescribed for him last week. Overall I am pleased in that regard. Fortunately there is no signs of active infection at this time. No fevers, chills, nausea, vomiting, or diarrhea. 01/05/2020 upon evaluation today patient appears to be doing more poorly in  regard to his right lower extremity at this point upon evaluation today. Unfortunately he continues to have issues in this regard and I think the biggest issue is controlling his edema. This obviously is not very well controlled at this point is been recommended that he use the Lasix twice a day but he has not been able to do that. Unfortunately I think this is leading to an issue where honestly he is not really able to effectively control his edema and therefore the wounds really are not doing significantly better. I do not think that he is going to be able to keep things under good control unless he is able to control his edema much better. I discussed this again in great detail with him today. 01/12/2020 good news is patient actually appears to be doing quite well today at this point. He does have an appointment with lymphedema clinic tomorrow. His legs appear healed and the toe on the left is almost completely healed. In general I  am very pleased with how things stand at this point. 01/19/2020 upon evaluation today patient appears to actually be doing well in regard to his lower extremities there is nothing open at this point. Fortunately he has done extremely well more recently. Has been seeing lymphedema clinic as well. With that being said he has Velcro wraps for his lower legs as well as his upper legs. The only wound really is on his toe which is the right great toe and this is barely anything even there. With all that being said I think it is good to be appropriate today to go ahead and switch him over to the Velcro compression wraps. 01/26/2020 upon evaluation today patient appears to be doing worse with regard to his lower extremities after last week switch him to Velcro compression wraps. Unfortunately he lasted less than 24 hours he did not have the sock portion of his Velcro wrap on the left leg and subsequently developed a blister underneath the Velcro portion. Obviously this is not good  and not what we were looking for at this point. He states the lymphedema clinic did tell him to wear the wrap for 23 hours and take him off for 1 I am okay with that plan but again right now we got a get things back under control again he may have some cellulitis noted as well. 02/02/2020 upon evaluation today patient unfortunately appears to have several areas of blistering on his bilateral lower extremities today mainly on the feet. His legs do seem to be doing somewhat better which is good news. Fortunately there is no evidence of active infection at this time. No fevers, chills, nausea, vomiting, or diarrhea. 02/16/2020 upon evaluation today patient appears to be doing well at this time with regard to his legs. He has a couple weeping areas on his toes but for the most part everything is doing better and does appear to be sealed up on his legs which is excellent news. We can continue with wrapping him at this point as he had every time we discontinue the wraps he just breaks out with new wounds. There is really no point in is going forward with this at this point. 03/08/2020 upon evaluation today patient actually appears to be doing quite well with regard to his lower extremity ulcers. He has just a very superficial and really almost nonexistent blister on the left lower extremity he has in general done very well with the compression wraps. With that being said I do not see any signs of infection at this time which is good news. 03/29/2020 upon evaluation today patient appears to be doing well with regard to his wounds currently except for where he had several new areas that opened up due to some of the wrap slipping and causing him trouble. He states he did not realize they had slipped. Nonetheless he has a 1 area on the right and 3 new areas on the left. Fortunately there is no signs of active infection at this time which is great news. 04/05/2020 upon evaluation today patient actually appears to be  doing quite well in general in regard to his legs currently. Fortunately there is no signs of active infection at this time. No fevers, chills, nausea, vomiting, or diarrhea. He tells me next week that he will actually be seen in the lymphedema clinic on Thursday at 10 AM I see him on Wednesday next week. 04/12/2020 upon evaluation today patient appears to be doing very well with regard to  his lower extremities bilaterally. In fact he does not appear to have any open wounds at this point which is good news. Fortunately there is no signs of active infection at this time. No fevers, chills, nausea, vomiting, or diarrhea. 04/19/2020 upon evaluation today patient appears to be doing well with regard to his wounds currently on the bilateral lower extremities. There does not appear to be any signs of active infection at this time. Fortunately there is no evidence of systemic infection and overall very pleased at this point. Nonetheless after I held him out last week he literally had blisters the next morning already which swelled up with him being right back here in the clinic. Overall I think that he is just not can be able to be discharged with his legs the way they are he is much to volume overloaded as far as fluid is concerned and that was discussed with him today of also discussed this but should try the clinic nurse manager as well as Dr. Leanord Hawking. 04/26/2020 upon evaluation today patient appears to be doing better with regard to his wounds currently. He is making some progress and overall swelling is under good control with the compression wraps. Fortunately there is no evidence of active infection at this time. 05/10/2020 on evaluation today patient appears to be doing overall well in regard to his lower extremities bilaterally. He is Tolerating the compression wraps without complication and with what we are seeing currently I feel like that he is making excellent progress. There is no signs of active  infection at this time. 05/24/2020 upon evaluation today patient appears to be doing well in regard to his legs. The swelling is actually quite a bit down compared to where it has been in the past. Fortunately there is no sign of active infection at this time which is also good news. With that being said he does have several wounds on his toes that have opened up at this point. 05/31/2020 upon evaluation today patient appears to be doing well with regard to his legs bilaterally where he really has no significant fluid buildup at this point overall he seems to be doing quite well. Very pleased in this regard. With regard to his toes these also seem to be drying up which is excellent. We have continue to wrap him as every time we tried as a transition to the juxta light wraps things just do not seem to get any better. 06/07/2020 upon evaluation today patient appears to be doing well with regard to his right leg at this point. Unfortunately left leg has a lot of blistering he tells me the wrap started to slide down on him when he tried to put his other Velcro wrap over top of it to help keep things in order but nonetheless still had some issues. 06/14/2020 on evaluation today patient appears to be doing well with regard to his lower extremity ulcers and foot ulcers at this point. I feel like everything is actually showing signs of improvement which is great news overall there is no signs of active infection at this time. No fevers, chills, nausea, vomiting, or diarrhea. 06/21/2020 on evaluation today patient actually appears to be doing okay in regard to his wounds in general. With that being said the biggest issue I see is on his right foot in particular the first and second toe seem to be doing a little worse due to the fact this is staying very wet. I think he is probably getting need  to change out his dressings a couple times in between each week when we see him in regard to his toes in order to keep this  drier based on the location and how this is proceeding. 06/28/2020 on evaluation today patient appears to be doing a little bit more poorly overall in regard to the appearance of the skin I am actually somewhat concerned about the possibility of him having a little bit of an infection here. We discussed the course of potentially giving him a doxycycline prescription which he is taken previously with good result. With that being said I do believe that this is potentially mild and at this point easily fixed. I just do not want anything to get any worse. 07/12/2020 upon evaluation today patient actually appears to be making some progress with regard to his legs which is great news there does not appear to be any evidence of active infection. Overall very pleased with where things stand. 07/26/2020 upon evaluation today patient appears to be doing well with regard to his leg ulcers and toe ulcers at this point. He has been tolerating the compression wraps without complication overall very pleased in this regard. 08/09/2020 upon evaluation today patient appears to be doing well with regard to his lower extremities bilaterally. Fortunately there is no signs of active infection overall I am pleased with where things stand. 08/23/2020 on evaluation today patient appears to be doing well with regard to his wound. He has been tolerating the dressing changes without complication. Fortunately there is no signs of active infection at this time. Overall his legs seem to be doing quite well which is great news and I am very pleased in that regard. No fevers, chills, nausea, vomiting, or diarrhea. 09/13/2020 upon evaluation today patient appears to be doing okay in regard to his lower extremities. He does have a fairly large blister on the right leg which I did remove the blister tissue from today so we can get this to dry out other than that however he seems to be doing quite well. There is no signs of active infection  at this time. 09/27/2020 upon evaluation today patient appears to actually be doing some better in regard to his right leg. Fortunately signs of active infection at this time which is great news. No fevers, chills, nausea, vomiting, or diarrhea. 10/04/2020 upon evaluation today patient actually appears to be showing signs of improvement which is great news with regard to his leg ulcers. Fortunately there is no signs of active infection which is great news he is still taking the antibiotics currently. No fevers, chills, nausea, vomiting, or diarrhea. 10/18/2020 on evaluation today patient appears to be doing well with regard to his legs currently. He has been tolerating the dressing changes including the wraps without complication. Fortunately there is no signs of active infection at this time. No fevers, chills, nausea, vomiting, or diarrhea. 10/25/2020 upon evaluation today patient appears to be doing decently well in regard to his wounds currently. He has been tolerating the dressing changes without complication. Overall I feel like he is making good progress albeit slow. Again this is something we can have to continue to wrap for some time to come most likely. 11/08/2020 upon evaluation today patient appears to be doing well with regard to his wounds currently. He has been tolerating the dressing changes without complication is not currently on any antibiotics and he does not appear to show any signs of infection. He does continue to have a lot of  drainage on the right leg not too severe but nonetheless this is very scattered. On the left leg this is looking to be much improved overall. 11/15/2020 upon evaluation today patient appears to be doing better with regard to his legs bilaterally. Especially the right leg which was much more significant last week. There does not appear to be any signs of active infection which is great news. No fevers, chills, nausea, vomiting, or diarrhea. 11/23/2019 upon  evaluation today patient appears to be doing poorly still in regard to his lower extremities bilaterally. Unfortunately his right leg in particular appears to be doing much more poorly there is no signs really of infection this is not warm to touch but he does have a lot of drainage and weeping unfortunately. With that reason I do believe that we may need to initiate some treatment here to try to help calm down some of the swelling of the right leg. I think switching to a 4-layer compression wrap would be beneficial here. The patient is in agreement with giving this a try. 11/29/2020 upon evaluation today patient appears to be doing well currently in regard to his leg ulcers. I feel like the right leg is doing better he still has a lot of drainage but we do see some improvement here. The 4-layer compression wrap I think was helpful. 12/06/2020 upon evaluation today patient appears to be doing well with regard to his legs. In fact they seem to be doing about the best I have seen up to this point. Fortunately there is no signs of active infection at this time. No fevers, chills, nausea, vomiting, or diarrhea. 12/20/2020 upon evaluation today patient appears to be doing well at this time in regard to his legs. He is not having any significant draining which is great news. Fortunately there is no signs of active infection at this time. No fevers, chills, nausea, vomiting, or diarrhea. 01/17/2021 upon evaluation today Remberto actually appears to be doing excellent in regard to his legs. He has a few areas again that come and go as far as his toes are concerned but overall this is doing quite well. 01/31/2021 upon evaluation today patient appears to be doing well with regard to his legs. Fortunately there does not appear to be any signs of active infection which is great news. Overall he is still having significant edema despite the compression wraps basically the 4-layer compression wrap to just keep things under  control there is really not much room for play. 4/13: Mr. Balderston is a longstanding patient in our clinic and benefits greatly from weekly compression wraps. Today he has no complaints. He has been tolerating the wraps well. He states he is using the lymphedema pumps at home. 5/4; patient presents for follow-up of his chronic lymphedema/venous insufficiency ulcers. He comes weekly for compression wraps. He has no complaints today. He was unable to tolerate the Coflex 2 layer Last week so we will do the four press 4-layer compression. He has been using his lymphedema pumps daily. 5/18; patient presents for 2-week follow-up. He has no complaints or issues today. He has developed a new wound to the right foot on his fourth toe. He overall feels well and denies signs of infection. 6/1; patient presents for 2-week follow-up. He has no complaints or issues today. He denies signs of infection. 04/18/2021 upon evaluation today patient appears to be doing well with regard to his legs bilaterally. Family open wound is actually on the toe of his left foot  everything else is completely closed which is great news. In general I am extremely pleased with where things stand at this point. The patient is also happy that things are doing so well. 05/02/2021 upon evaluation today patient's legs actually appear to be doing quite well today. Fortunately there does not appear to be any signs of active infection which is great and overall I am extremely pleased with where he stands today. The patient does not appear to have any evidence of active infection at this time which is also great news. 05/09/2021 upon evaluation today patient appears to be doing a little bit more poorly in regard to his legs. Unfortunately he is having issues with some breakdown and a blood blister on the left leg this is due to I believe honestly to how it was wrapped last week. Fortunately there does not appear to be any signs of infection but  nonetheless this is still a concern to be honest. No fevers, chills, nausea, vomiting, or diarrhea. 05/16/2021 upon evaluation today patient appears to be doing significantly better as compared to last week. I am very pleased with where things stand today. There does not appear to be any signs of infection which is great news and overall very pleased with where we stand. No fevers, chills, nausea, vomiting, or diarrhea. 05/30/2021 upon evaluation today patient appears to be doing well with regard to his legs. He has been tolerating the dressing changes without complication. Fortunately there does not appear to be any signs of active infection which is great news and overall I am extremely pleased with where things stand today. No fevers, chills, nausea, vomiting, or diarrhea. 06/20/2021 upon evaluation today patient actually appears to be making good progress today and very pleased with what we are seeing. I think his legs are really maintaining. As long as we continue wrapping he seems to be doing excellent in my opinion. Fortunately there is no signs of active infection at this time. No fevers, chills, nausea, vomiting, or diarrhea. 07/11/2021 upon evaluation today patient actually appears to be making excellent progress at this time. Fortunately there does not appear to be any evidence of active infection which is great news and overall I am extremely pleased with where things stand today. No fevers, chills, nausea, vomiting, or diarrhea. 07/25/2021 upon evaluation today patient appears to be doing well currently in regard to his lower extremities. He has been making good progress here and I do not see anything that is actually open significantly today this is great news. No fevers, chills, nausea, vomiting, or diarrhea. 08/08/2021 upon evaluation today patient appears to be doing well with regard to his wound. He has been tolerating the dressing changes without complication. With that being said  unfortunately has a new area that opened up as far as his right posterior leg is concerned this was a blister he also has an area on the third toe right foot which also reopen. Fortunately there is no signs of active infection at this time which is great news. No fevers, chills, nausea, vomiting, or diarrhea. 10/17; patient came in today at his request initially for a nurse visit because it but out of concern for deterioration in both his lower legs and cellulitis I was asked to look at him. He comes in with increased swelling which he says started over the weekend he started to notice pain as well in his left medial ankle, right knee, left knee left dorsal foot. His wraps fell down contributing to some  of this but he has not been using his compression pumps over the weekend for reasons that are not really clear. He comes in with multiple new wounds including the right posterior leg, right third toe, right fourth toe, left second toe left medial ankle left dorsal ankle and right anterior lower leg 09/19/2021 upon evaluation today patient does seem to be making improvements in general which is great news. I do not see any evidence of infection currently he does have some hypergranulation of the anterior portion of his ankle on the left side this is going require some debridement to pare this down and then subsequently silver nitrate probably due to the amount of bleeding that he is probably going to experience. He is in agreement with this plan however. 09/26/2021 upon evaluation today although the patient's legs appear to be doing okay today unfortunately he did have maggots noted during the evaluation as well. This is again quite unfortunate with the respect to the fact that he feels like that he got the wrap wet which was noted today he is not sure when and I think this is what led to the issue. Nonetheless we can try to see what we can do about silly things off so that this does not happen again in  the future although he is can have to be diligent about taking care of his wraps as well. 10/03/2021 upon evaluation today patient appears to be doing okay in regard to his legs currently. He unfortunately has maggots again noted on the left foot. This obviously is becoming quite an issue to be perfectly honest. I do think that he needs to see what can be done at home to try to limit this exposure. Last week we had cleaned everything away so this is a new infestation as far as that is concerned. There is really not much that I can do to combat that I am trying to do what we can here in the clinic to wrap his foot and try to prevent any access of that again with knots there is a small this becomes very difficult. 10/10/2021 upon evaluation today patient appears to be doing poorly in regard to his legs he is very swollen and unfortunately I think a big part of the issue here is simply that he is not taking his fluid pills appropriately stressed probably is not helping much at all either. He still having a lot of issues as far as getting moved and having to be out of his current living condition. With that being said he does have of note maggots noted on the right foot this time completely separate from what we have been seeing he tells me that he got his wraps wet again. Its mainly when it rains it sounds like that he goes out to his car in the area where he has to park there is a area that collects a fairly large puddle according to what he tells me today either way I explained to him which we have done before as well that if he gets his wraps wet he needs to let us know so we can get them changed out. He does not need to keep them on wet. 10/17/2021 upon evaluation today patient appears to be doing well in regard to his legs. In fact things are significantly better compared to where they were previous. Fortunately I see no evidence of infection currently which is great news. No fevers, chills, nausea,  vomiting, or diarrhea. 10/24/2021 upon evaluation  today patient appears to be doing awesome in regard to his legs in general. I think everything is showing signs of significant improvement which is great news. There is really only 1 opening on the second toe left foot everything else is closed. 11/07/2020 upon evaluation today patient appears to be doing well with regard to his legs in general he does have a blister on the left foot but otherwise things are doing overall quite well. 11/28/2021 upon evaluation today patient's legs appear to be doing decently well. The biggest issue we see is that he does have some blistering where the wrap seems to have slid down on the right leg near the top. This is just below the knee. That is good to be the biggest issue going forward at this point in my opinion. Otherwise I really feel like he is doing pretty well across the board with the other wound locations previously noted. 12/05/2021 upon evaluation today patient appears to be doing well with regard to the majority of his wounds in fact he has 1 area healed. Unfortunately he has a new area that broken down over the anterior portion of his left ankle. This is quite deep and draining quite a bit. Fortunately I do not see any signs of infection here though on the lateral portion of his ankle there is some erythema and warmth to touch no drainage nothing really to culture but I am concerned about cellulitis starting up here. 12/12/2021 upon evaluation today patient appears to be doing significantly better in regard to his leg ulcers. Everything is improved as compared to the last time I saw him. I am actually very pleased with where we stand today. The patient is not having any significant pain which is great news. 12/26/2021 upon evaluation patient appears to be doing a little bit more poorly in regard to the wound on his second toe left foot. Unfortunately with the wrap we put on he collect a lot of fluid underneath  the underside of the toe and this has caused this to breakdown to some degree. Fortunately I do not see any evidence of active infection locally or systemically which is great news. No fevers, chills, nausea, vomiting, or diarrhea. Objective Constitutional Obese and well-hydrated in no acute distress. Vitals Time Taken: 10:47 AM, Height: 70 in, Weight: 380.2 lbs, BMI: 54.5, Temperature: 98.5 F, Pulse: 91 bpm, Respiratory Rate: 24 breaths/min, Blood Pressure: 157/70 mmHg, Capillary Blood Glucose: 197 mg/dl. Respiratory normal breathing without difficulty. Psychiatric this patient is able to make decisions and demonstrates good insight into disease process. Alert and Oriented x 3. pleasant and cooperative. General Notes: Upon inspection patient's wound bed showed evidence of good granulation and epithelization at this point. Fortunately I do not see any signs of infection in general and overall I think that his legs are doing much better than when He has the toe which again is of concern on the left foot second toe but again I feel like this is something we should be able to keep and get under better control fairly quickly. Integumentary (Hair, Skin) Wound #204 status is Open. Original cause of wound was Gradually Appeared. The date acquired was: 09/26/2021. The wound has been in treatment 13 weeks. The wound is located on the Left T Second. The wound measures 1.3cm length x 1.7cm width x 0.1cm depth; 1.736cm^2 area and 0.174cm^3 volume. There oe is Fat Layer (Subcutaneous Tissue) exposed. There is no tunneling or undermining noted. There is a medium amount of serous  drainage noted. The wound margin is distinct with the outline attached to the wound base. There is small (1-33%) pink granulation within the wound bed. There is no necrotic tissue within the wound bed. Wound #211 status is Open. Original cause of wound was Gradually Appeared. The date acquired was: 12/05/2021. The wound has been in  treatment 3 weeks. The wound is located on the Left,Anterior Ankle. The wound measures 0.6cm length x 3cm width x 0.1cm depth; 1.414cm^2 area and 0.141cm^3 volume. There is Fat Layer (Subcutaneous Tissue) exposed. There is no tunneling or undermining noted. There is a medium amount of serosanguineous drainage noted. The wound margin is flat and intact. There is large (67-100%) red granulation within the wound bed. There is a small (1-33%) amount of necrotic tissue within the wound bed including Adherent Slough. Wound #212 status is Healed - Epithelialized. Original cause of wound was Blister. The date acquired was: 12/05/2021. The wound has been in treatment 3 weeks. The wound is located on the Right,Lateral Lower Leg. The wound measures 0cm length x 0cm width x 0cm depth; 0cm^2 area and 0cm^3 volume. There is Fat Layer (Subcutaneous Tissue) exposed. There is a medium amount of serous drainage noted. The wound margin is flat and intact. There is large (67-100%) red, pink granulation within the wound bed. There is a small (1-33%) amount of necrotic tissue within the wound bed. Wound #214 status is Open. Original cause of wound was Shear/Friction. The date acquired was: 12/26/2021. The wound is located on the Left,Posterior Toe Second. The wound measures 0.5cm length x 3cm width x 0.2cm depth; 1.178cm^2 area and 0.236cm^3 volume. There is Fat Layer (Subcutaneous Tissue) exposed. There is no tunneling or undermining noted. There is a medium amount of serosanguineous drainage noted. The wound margin is flat and intact. There is small (1-33%) granulation within the wound bed. There is a large (67-100%) amount of necrotic tissue within the wound bed including Eschar. 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 There was a Four Layer Compression Therapy Procedure by Shawn Stall, RN. Post procedure Diagnosis Wound #211: Same as Pre-Procedure There was a Four Layer Compression Therapy Procedure by Shawn Stall, RN. Post procedure Diagnosis Wound #: Same as Pre-Procedure Plan Follow-up Appointments: Return Appointment in 1 week. - Wednesday Algis Liming, RN Room 8 Bathing/ Shower/ Hygiene: May shower with protection but do not get wound dressing(s) wet. Edema Control - Lymphedema / SCD / Other: Lymphedema Pumps. Use Lymphedema pumps on leg(s) 2-3 times a day for 45-60 minutes. If wearing any wraps or hose, do not remove them. Continue exercising as instructed. Elevate legs to the level of the heart or above for 30 minutes daily and/or when sitting, a frequency of: - throughout the day Avoid standing for long periods of time. Exercise regularly Other Edema Control Orders/Instructions: - right  leg 4 layer compression with unna boot to secure to upper portion of lower leg. apply lotion to right leg. WOUND #204: - T Second Wound Laterality: Left oe Peri-Wound Care: Zinc Oxide Ointment 30g tube 1 x Per Week/30 Days Discharge Instructions: Apply Zinc Oxide to periwound with each dressing change Secondary Dressing: Woven Gauze Sponges 2x2 in 1 x Per Week/30 Days Discharge Instructions: Apply over primary dressing as directed. WOUND  #211: - Ankle Wound Laterality: Left, Anterior Peri-Wound Care: Sween Lotion (Moisturizing lotion) 1 x Per Week/30 Days Discharge Instructions: Apply moisturizing lotion as directed Prim Dressing: KerraCel Ag Gelling Fiber Dressing, 2x2 in (silver alginate) 1 x Per Week/30 Days ary Discharge Instructions: Apply silver alginate to wound bed as instructed Secondary Dressing: Woven Gauze Sponge, Non-Sterile 4x4 in 1 x Per Week/30 Days Discharge Instructions: Apply over primary dressing as directed. Secondary Dressing: ABD Pad, 8x10 1 x Per Week/30 Days Discharge Instructions: Apply over primary dressing as directed. Com pression Wrap: FourPress (4 layer compression wrap) 1 x Per Week/30 Days Discharge Instructions: Apply four layer compression . unna layer at top to secure wrap WOUND #214: - T Second Wound Laterality: Left, Posterior oe Cleanser: Soap and Water 1 x Per Day/30 Days Discharge Instructions: May shower and wash wound with dial antibacterial soap and water prior to dressing change. Peri-Wound Care: Zinc Oxide Ointment 30g tube 1 x Per Day/30 Days Discharge Instructions: Apply Zinc Oxide to periwound with each dressing change Secondary Dressing: Woven Gauze Sponges 2x2 in 1 x Per Day/30 Days Discharge Instructions: Apply 2x2 gauze rolled over zinc oxide 1. I would recommend currently that we going to continue with the wound care measures as before and the patient is in agreement the plan. This includes the use of the silver alginate dressing to the open wound locations. The patient is tolerating that quite well. 2. I am also can recommend at this time that we have the patient continue to monitor for any signs of increased fluid retention or pain right now he is doing quite well. 3. We will continue with the 4-layer compression wraps bilaterally. I am also can recommend he should be elevating his legs and taking his fluid pills as directed by his physician. We will see patient back  for reevaluation in 1 week here in the clinic. If anything worsens or changes patient will contact our office for additional recommendations. Electronic Signature(s) Signed: 12/26/2021 12:04:07 PM By: Lenda Kelp PA-C Entered By: Lenda Kelp on 12/26/2021 12:04:06 -------------------------------------------------------------------------------- SuperBill Details Patient Name: Date of Service: [name], [name] 12/26/2021 Medical Record Number: [id number] Patient Account Number: [id number] Date of Birth/Sex: Treating RN: 03-24-1951 (71 y.o. 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 L97.812 Non-pressure chronic ulcer of other part of right lower leg with fat layer exposed I87.333 Chronic venous hypertension (

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
## 2022-01-01 ENCOUNTER — Telehealth: Payer: Self-pay

## 2022-01-01 NOTE — Telephone Encounter (Signed)
TC to client, he is still in [facility], no apt available yet, weekly rent is gone up to $700 week, has had to borrow from family.  Renovation of promised apt is not finished, he is struggling to make ends meet.  Will send info to him regarding food banks that he could utilize.  Juliann Pulse, RN

## 2022-01-02 ENCOUNTER — Encounter (HOSPITAL_BASED_OUTPATIENT_CLINIC_OR_DEPARTMENT_OTHER): Payer: Medicare Other | Attending: Physician Assistant | Admitting: Physician Assistant

## 2022-01-02 ENCOUNTER — Other Ambulatory Visit: Payer: Self-pay

## 2022-01-02 DIAGNOSIS — L03115 Cellulitis of right lower limb: Secondary | ICD-10-CM | POA: Diagnosis not present

## 2022-01-02 DIAGNOSIS — L03116 Cellulitis of left lower limb: Secondary | ICD-10-CM | POA: Insufficient documentation

## 2022-01-02 DIAGNOSIS — L97519 Non-pressure chronic ulcer of other part of right foot with unspecified severity: Secondary | ICD-10-CM | POA: Diagnosis not present

## 2022-01-02 DIAGNOSIS — L97812 Non-pressure chronic ulcer of other part of right lower leg with fat layer exposed: Secondary | ICD-10-CM | POA: Insufficient documentation

## 2022-01-02 DIAGNOSIS — L97521 Non-pressure chronic ulcer of other part of left foot limited to breakdown of skin: Secondary | ICD-10-CM | POA: Diagnosis not present

## 2022-01-02 DIAGNOSIS — E11621 Type 2 diabetes mellitus with foot ulcer: Secondary | ICD-10-CM | POA: Diagnosis not present

## 2022-01-02 DIAGNOSIS — I890 Lymphedema, not elsewhere classified: Secondary | ICD-10-CM | POA: Insufficient documentation

## 2022-01-02 DIAGNOSIS — E11622 Type 2 diabetes mellitus with other skin ulcer: Secondary | ICD-10-CM | POA: Diagnosis not present

## 2022-01-02 DIAGNOSIS — I87333 Chronic venous hypertension (idiopathic) with ulcer and inflammation of bilateral lower extremity: Secondary | ICD-10-CM | POA: Diagnosis not present

## 2022-01-02 DIAGNOSIS — L97522 Non-pressure chronic ulcer of other part of left foot with fat layer exposed: Secondary | ICD-10-CM | POA: Diagnosis not present

## 2022-01-02 DIAGNOSIS — E1140 Type 2 diabetes mellitus with diabetic neuropathy, unspecified: Secondary | ICD-10-CM | POA: Diagnosis not present

## 2022-01-02 DIAGNOSIS — L97322 Non-pressure chronic ulcer of left ankle with fat layer exposed: Secondary | ICD-10-CM | POA: Diagnosis not present

## 2022-01-02 NOTE — Progress Notes (Addendum)
[name], Kevin ([id number]) Visit Report for 01/02/2022 Chief Complaint Document Details Patient Name: Date of Service: CO AULTON, ROUTT 01/02/2022 10:30 A M Medical Record Number: [id number] Patient Account Number: [id number] Date of Birth/Sex: Treating RN: 05/29/1951 (71 y.o. M) Primary Care Provider: Nicoletta Ba Other Clinician: Referring Provider: Treating Provider/Extender: Adele Dan in Treatment: 310 Information Obtained from: Patient Chief Complaint Patient is here for evaluation of venous/lymphedema ulcers Electronic Signature(s) Signed: 01/02/2022 11:10:41 AM By: Lenda Kelp PA-C Entered By: Lenda Kelp on 01/02/2022 11:10:41 -------------------------------------------------------------------------------- HPI Details Patient Name: Date of Service: [name], [name] 01/02/2022 10:30 A M Medical Record Number: [id number] Patient Account Number: [id number] Date of Birth/Sex: Treating RN: January 23, 1951 (71 y.o. M) Primary Care Provider: Nicoletta Ba Other Clinician: Referring Provider: Treating Provider/Extender: Adele Dan in Treatment: 310 History of Present Illness HPI Description: Referred by PCP for consultation. Patient has long standing history of BLE venous stasis, no prior ulcerations. At beginning of month, developed cellulitis and weeping. Received IM Rocephin followed by Keflex and resolved. Wears compression stocking, appr 6 months old. Not sure strength. No present drainage. 01/22/16 this is a patient who is a type II diabetic on insulin. 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 He tells me he religiously wears pressure stockings of uncertain strength. He was here with weeping edema about 8 months ago but did not have an open wound. Roughly a month ago he had a reopening on his bilateral legs. He is been using bandages and Neosporin. He does not complain of pain. 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 Last BUN/creatinine I have is from 11/20/15 at 13 and 1.0 respectively 01/29/16; patient arrives today having tolerated the Profore wrap. He brought in his stockings and these are 18 mmHg stockings he bought from [employer]. The compression here is likely inadequate. He does not complain of pain or excessive drainage she has no systemic symptoms. The wound on the right looks improved as does the one on the left although one on the left is more substantial with still tissue at risk below the actual wound area on the bilateral posterior calf 02/05/16; patient arrives with poor edema control. He states that we did put a 4 layer compression on it last week. No weight appear 5 this. 02/12/16; the area on the posterior right Has healed. The left Has a substantial wound that has necrotic surface eschar that requires a debridement with a curette. 02/16/16;the patient called or a Nurse visit secondary to increased swelling. He had been in earlier in the week with his right leg healed. He was transitioned to is on pressure stocking on the right leg with the only open wound on the left, a substantial area on the left posterior calf. Note he has a history of severe lower extremity edema, he has a history of chronic atrial fibrillation but not heart failure per my notes but I'll need to research this. He is not complaining of chest pain shortness of breath or orthopnea. The intake nurse noted blisters on the previously closed right leg 02/19/16; this is the patient's regular visit day. I see him on Friday with escalating edema new wounds on the right leg and clear signs of at least right ventricular heart failure. I increased his Lasix to 40 twice a day. He is returning  currently in follow-up. States he is noticed a decrease in that the edema 02/26/16 patient's legs have much less  edema. There is nothing really open on the right leg. The left leg has improved condition of the large superficial wound on the posterior left leg 03/04/16; edema control is very much better. The patient's right leg wounds have healed. On the left leg he continues to have severe venous inflammation on the posterior aspect of the left leg. There is no tenderness and I don't think any of this is cellulitis. 03/11/16; patient's right leg is married healed and he is in his own stocking. The patient's left leg has deteriorated somewhat. There is a lot of erythema around the wound on the posterior left leg. There is also a significant rim of erythema posteriorly just above where the wrap would've ended there is a new wound in this location and a lot of tenderness. Can't rule out cellulitis in this area. 03/15/16; patient's right leg remains healed and he is in his own stocking. The patient's left leg is much better than last review. His major wound on the posterior aspect of his left Is almost fully epithelialized. He has 3 small injuries from the wraps. Really. Erythema seems a lot better on antibiotics 03/18/16; right leg remains healed and he is in his own stocking. The patient's left leg is much better. The area on the posterior aspect of the left calf is fully epithelialized. His 3 small injuries which were wrap injuries on the left are improved only one seems still open his erythema has resolved 03/25/16; patient's right leg remains healed and he is in his own stocking. There is no open area today on the left leg posterior leg is completely closed up. His wrap injuries at the superior aspect of his leg are also resolved. He looks as though he has some irritation on the dorsal ankle but this is fully epithelialized without evidence of infection. 03/28/16; we discharged this patient on Monday. Transitioned him into his own stocking. There were problems almost immediately with uncontrolled swelling weeping edema  multiple some of which have opened. He does not feel systemically unwell in particular no chest pain no shortness of breath and he does not feel 04/08/16; the edema is under better control with the Profore light wrap but he still has pitting edema. There is one large wound anteriorly 2 on the medial aspect of his left leg and 3 small areas on the superior posterior calf. Drainage is not excessive he is tolerating a Profore light well 04/15/16; put a Profore wrap on him last week. This is controlled is edema however he had a lot of pain on his left anterior foot most of his wounds are healed 04/22/16 once again the patient has denuded areas on the left anterior foot which he states are because his wrap slips up word. He saw his primary physician today is on Lasix 40 twice a day and states that he his weight is down 20 pounds over the last 3 months. 04/29/16: Much improved. left anterior foot much improved. He is now on Lasix 80 mg per day. Much improved edema control 05/06/16; I was hoping to be able to discharge him today however once again he has blisters at a low level of where the compression was placed last week mostly on his left lateral but also his left medial leg and a small area on the anterior part of the left foot. 05/09/16; apparently the patient went home after his  appointment on 7/4 later in the evening developing pain in his upper medial thigh together with subjective fever and chills although his temperature was not taken. The pain was so intense he felt he would probably have to call 911. However he then remembered that he had leftover doxycycline from a previous round of antibiotics and took these. By the next morning he felt a lot better. He called and spoke to one of our nurses and I approved doxycycline over the phone thinking that this was in relation to the wounds we had previously seen although they were definitely were not. The patient feels a lot better old fever no chills he is still  working. Blood sugars are reasonably controlled 05/13/16; patient is back in for review of his cellulitis on his anterior medial upper thigh. He is taking doxycycline this is a lot better. Culture I did of the nodular area on the dorsal aspect of his foot grew MRSA this also looks a lot better. 05/20/16; the patient is cellulitis on the medial upper thigh has resolved. All of his wound areas including the left anterior foot, areas on the medial aspect of the left calf and the lateral aspect of the calf at all resolved. He has a new blister on the left dorsal foot at the level of the fourth toe this was excised. No evidence of infection 05/27/16; patient continues to complain weeping edema. He has new blisterlike wounds on the left anterior lateral and posterior lateral calf at the top of his wrap levels. The area on his left anterior foot appears better. He is not complaining of fever, pain or pruritus in his feet. 05/30/16; the patient's blisters on his left anterior leg posterior calf all look improved. He did not increase the Lasix 100 mg as I suggested because he was going to run out of his 40 mg tablets. He is still having weeping edema of his toes 06/03/16; I renewed his Lasix at 80 mg once a day as he was about to run out when I last saw him. He is on 80 mg of Lasix now. I have asked him to cut down on the excessive amount of water he was drinking and asked him to drink according to his thirst mechanisms 06/12/2016 -- was seen 2 days ago and was supposed to wear his compression stockings at home but he is developed lymphedema and superficial blisters on the left lower extremity and hence came in for a review 06/24/16; the remaining wound is on his left anterior leg. He still has edema coming from between his toes. There is lymphedema here however his edema is generally better than when I last saw this. He has a history of atrial fibrillation but does not have a known history of congestive heart  failure nevertheless I think he probably has this at least on a diastolic basis. 07/01/16 I reviewed his echocardiogram from January 2017. This was essentially normal. He did not have LVH, EF of 55-60%. His right ventricular function was normal although he did have trivial tricuspid and pulmonic regurgitation. This is not audible on exam however. I increased his Lasix to do massive edema in his legs well above his knees I think in early July. He was also drinking an excessive amount of water at the time. 07/15/16; missed his appointment last week because of the Labor Day holiday on Monday. He could not get another appointment later in the week. Started to feel the wrap digging in superiorly so we remove the  top half and the bottom half of his wrap. He has extensive erythema and blistering superiorly in the left leg. Very tender. Very swollen. Edema in his foot with leaking edema fluid. He has not been systemically unwell 07/22/16; the area on the left leg laterally required some debridement. The medial wounds look more stable. His wrap injury wounds appear to have healed. Edema and his foot is better, weeping edema is also better. He tells me he is meeting with the supplier of the external compression pumps at work 08/05/16; the patient was on vacation last week in [facility]. His wrap is been on for an extended period of time. Also over the weekend he developed an extensive area of tender erythema across his anterior medial thigh. He took to doxycycline yesterday that he had leftover from a previous prescription. The patient complains of weeping edema coming out of his toes 08/08/16; I saw this patient on 10/2. He was tender across his anterior thigh. I put him on doxycycline. He returns today in follow-up. He does not have any open wounds on his lower leg, he still has edema weeping into his toes. 08/12/16; patient was seen back urgently today to follow-up for his extensive left thigh  cellulitis/erysipelas. He comes back with a lot less swelling and erythema pain is much better. I believe I gave him Augmentin and Cipro. His wrap was cut down as he stated a roll down his legs. He developed blistering above the level of the wrap that remained. He has 2 open blisters and 1 intact. 08/19/16; patient is been doing his primary doctor who is increased his Lasix from 40-80 once a day or 80 already has less edema. Cellulitis has remained improved in the left thigh. 2 open areas on the posterior left calf 08/26/16; he returns today having new open blisters on the anterior part of his left leg. He has his compression pumps but is not yet been shown how to use some vital representative from the supplier. 09/02/16 patient returns today with no open wounds on the left leg. Some maceration in his plantar toes 09/10/2016 -- Dr. Leanord Hawking had recently discharged him on 09/02/2016 and he has come right back with redness swelling and some open ulcers on his left lower extremity. He says this was caused by trying to apply his compression stockings and he's been unable to use this and has not been able to use his lymphedema pumps. He had some doxycycline leftover and he has started on this a few days ago. 09/16/16; there are no open wounds on his leg on the left and no evidence of cellulitis. 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 He does not complain of symptoms here. I am not clear use using his external compression pumps. 09/23/16; I have not seen this patient in 2 weeks. He canceled his appointment 10 days ago as he was going on vacation. He tells me that on Monday he noticed a large area on his posterior left leg which is been draining copiously and is reopened into a large wound. He is been using ABDs and the external part of his juxtalite, according to our nurse this was not on properly. 10/07/16; Still a substantial area on the posterior  left leg. Using silver alginate 10/14/16; in general better although there is still open area which looks healthy. Still using silver alginate. He reminds me that this happen before he left for [facility]. T oday while  he was showering in the morning. He had been using his juxtalite's 10/21/16; the area on his posterior left leg is fully epithelialized. However he arrives today with a large area of tender erythema in his medial and posterior left thigh just above the knee. I have marked the area. Once again he is reluctant to consider hospitalization. I treated him with oral antibiotics in the past for a similar situation with resolution I think with doxycycline however this area it seems more extensive to me. He is not complaining of fever but does have chills and says states he is thirsty. His blood sugar today was in the 140s at home 10/25/16 the area on his posterior left leg is fully epithelialized although there is still some weeping edema. The large area of tenderness and erythema in his medial and posterior left thigh is a lot less tender although there is still a lot of swelling in this thigh. He states he feels a lot better. He is on doxycycline and Augmentin that I started last week. This will continued until Tuesday, December 26. I have ordered a duplex ultrasound of the left thigh rule out DVT whether there is an abscess something that would need to be drained I would also like to know. 11/01/16; he still has weeping edema from a not fully epithelialized area on his left posterior calf. Most of the rest of this looks a lot better. He has completed his antibiotics. His thigh is a lot better. Duplex ultrasound did not show a DVT in the thigh 11/08/16; he comes in today with more Denuded surface epithelium from the posterior aspect of his calf. There is no real evidence of cellulitis. The superior aspect of his wrap appears to have put quite an indentation in his leg just below the knee and  this may have contributed. He does not complain of pain or fever. We have been using silver alginate as the primary dressing. The area of cellulitis in the right thigh has totally resolved. He has been using his compression stockings once a week 11/15/16; the patient arrives today with more loss of epithelium from the posterior aspect of his left calf. He now has a fairly substantial wound in this area. The reason behind this deterioration isn't exactly clear although his edema is not well controlled. He states he feels he is generally more swollen systemically. He is not complaining of chest pain shortness of breath fever. T me he has an appointment with his primary physician in early February. He is on 80 mg of oral ells Lasix a day. He claims compliance with the external compression pumps. He is not having any pain in his legs similar to what he has with his recurrent cellulitis 11/22/16; the patient arrives a follow-up of his large area on his left lateral calf. This looks somewhat better today. He came in earlier in the week for a dressing change since I saw him a week ago. He is not complaining of any pain no shortness of breath no chest pain 11/28/16; the patient arrives for follow-up of his large area on the left lateral calf this does not look better. In fact it is larger weeping edema. The surface of the wound does not look too bad. We have been using silver alginate although I'm not certain that this is a dressing issue. 12/05/16; again the patient follows up for a large wound on the left lateral and left posterior calf this does not look better. There continues to be weeping  edema necrotic surface tissue. More worrisome than this once again there is erythema below the wound involving the distal Achilles and heel suggestive of cellulitis. He is on his feet working most of the day of this is not going well. We are changing his dressing twice a week to facilitate the drainage. 12/12/16; not much  change in the overall dimensions of the large area on the left posterior calf. This is very inflamed looking. I gave him an. Doxycycline last week does not really seem to have helped. He found the wrap very painful indeed it seems to of dog into his legs superiorly and perhaps around the heel. He came in early today because the drainage had soaked through his dressings. 12/19/16- patient arrives for follow-up evaluation of his left lower extremity ulcers. He states that he is using his lymphedema pumps once daily when there is "no drainage". He admits to not using his lipedema pumps while under current treatment. His blood sugars have been consistently between 150-200. 12/26/16; the patient is not using his compression pumps at home because of the wetness on his feet. I've advised him that I think it's important for him to use this daily. He finds his feet too wet, he can put a plastic bag over his legs while he is in the pumps. Otherwise I think will be in a vicious circle. We are using silver alginate to the major area on his left posterior calf 01/02/17; the patient's posterior left leg has further of all into 3 open wounds. All of them covered with a necrotic surface. He claims to be using his compression pumps once a day. His edema control is marginal. Continue with silver alginate 01/10/17; the patient's left posterior leg actually looks somewhat better. There is less edema, less erythema. Still has 3 open areas covered with a necrotic surface requiring debridement. He claims to be using his compression pumps once a day his edema control is better 01/17/17; the patient's left posterior calf look better last week when I saw him and his wrap was changed 2 days ago. He has noted increasing pain in the left heel and arrives today with much larger wounds extensive erythema extending down into the entire heel area especially tender medially. He is not systemically unwell CBGs have been controlled no fever.  Our intake nurse showed me limegreen drainage on his AVD pads. 01/24/17; his usual this patient responds nicely to antibiotics last week giving him Levaquin for presumed Pseudomonas. The whole entire posterior part of his leg is much better much less inflamed and in the case of his Achilles heel area much less tender. He has also had some epithelialization posteriorly there are still open areas here and still draining but overall considerably better 01/31/17- He has continue to tolerate the compression wraps. he states that he continues to use the lymphedema pumps daily, and can increase to twice daily on the weekends. He is voicing no complaints or concerns regarding his LLE ulcers 02/07/17-he is here for follow-up evaluation. He states that he noted some erythema to the left medial and anterior thigh, which he states is new as of yesterday. He is concerned about recurrent cellulitis. He states his blood sugars have been slightly elevated, this morning in the 180s 02/14/17; he is here for follow-up evaluation. When he was last here there was erythema superiorly from his posterior wound in his anterior thigh. He was prescribed Levaquin however a culture of the wound surface grew MRSA over the phone I changed  him to doxycycline on Monday and things seem to be a lot better. 02/24/17; patient missed his appointment on Friday therefore we changed his nurse visit into a physician visit today. Still using silver alginate on the large area of the posterior left thigh. He isn't new area on the dorsal left second toe 03/03/17; actually better today although he admits he has not used his external compression pumps in the last 2 days or so because of work responsibilities over the weekend. 03/10/17; continued improvement. External compression pumps once a day almost all of his wounds have closed on the posterior left calf. Better edema control 03/17/17; in general improved. He still has 3 small open areas on the lateral  aspect of his left leg however most of the area on the posterior part of his leg is epithelialized. He has better edema control. He has an ABD pad under his stocking on the right anterior lower leg although he did not let us look at that today. 03/24/17; patient arrives back in clinic today with no open areas however there are areas on the posterior left calf and anterior left calf that are less than 100% epithelialized. His edema is well controlled in the left lower leg. There is some pitting edema probably lymphedema in the left upper thigh. He uses compression pumps at home once per day. I tried to get him to do this twice a day although he is very reticent. 04/01/2017 -- for the last 2 days he's had significant redness, tenderness and weeping and came in for an urgent visit today. 04/07/17; patient still has 6 more days of doxycycline. He was seen by Dr. Meyer Russel last Wednesday for cellulitis involving the posterior aspect, lateral aspect of his Involving his heel. For the most part he is better there is less erythema and less weeping. He has been on his feet for 12 hours 2 over the weekend. Using his compression pumps once a day 04/14/17 arrives today with continued improvement. Only one area on the posterior left calf that is not fully epithelialized. He has intense bilateral venous inflammation associated with his chronic venous insufficiency disease and secondary lymphedema. We have been using silver alginate to the left posterior calf wound In passing he tells [place] today that the right leg but we have not seen in quite some time has an open area on it but he doesn't want [place] to look at this today states he will show this to [place] next week. 04/21/17; there is no open area on his left leg although he still reports some weeping edema. He showed [place] his right leg today which is the first time we've seen this leg in a long time. He has a large area of open wound on the right leg anteriorly healthy granulation.  Quite a bit of swelling in the right leg and some degree of venous inflammation. He told us about the right leg in passing last week but states that deterioration in the right leg really only happened over the weekend 04/28/17; there is no open area on the left leg although there is an irritated part on the posterior which is like a wrap injury. The wound on the right leg which was new from last week at least to [place] is a lot better. 05/05/17; still no open area on the left leg. Patient is using his new compression stocking which seems to be doing a good job of controlling the edema. He states he is using his compression pumps once per  day. The right leg still has an open wound although it is better in terms of surface area. Required debridement. A lot of pain in the posterior right Achilles marked tenderness. Usually this type of presentation this patient gives concern for an active cellulitis 05/12/17; patient arrives today with his major wound from last week on the right lateral leg somewhat better. Still requiring debridement. He was using his compression stocking on the left leg however that is reopened with superficial wounds anteriorly he did not have an open wound on this leg previously. He is still using his juxta light's once daily at night. He cannot find the time to do this in the morning as he has to be at work by 7 AM 05/19/17; right lateral leg wound looks improved. No debridement required. The concerning area is on the left posterior leg which appears to almost have a subcutaneous hemorrhagic component to it. We've been using silver alginate to all the wounds 05/26/17; the right lateral leg wound continues to look improved. However the area on the left posterior calf is a tightly adherent surface. [place] using silver alginate. Because of the weeping edema in his legs there is very little good alternatives. 06/02/17; the patient left here last week looking quite good. Major wound on the left  posterior calf and a small one on the right lateral calf. Both of these look satisfactory. He tells me that by Wednesday he had noted increased pain in the left leg and drainage. He called on Thursday and Friday to get an appointment here but we were blocked. He did not go to urgent care or his primary physician. He thinks he had a fever on Thursday but did not actually take his temperature. He has not been using his compression pumps on the left leg because of pain. I advised him to go to the emergency room today for IV antibiotics for stents of left leg cellulitis but he has refused I have asked him to take 2 days off work to keep his leg elevated and he has refused this as well. In view of this I'm going to call him and Augmentin and doxycycline. 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 However after a detailed discussion regarding the risk benefits and alternatives he refuses to get admitted to the hospital. With no other recourse I will continue him on oral antibiotics as before and hopefully he'll have his infectious disease consultation this week. 06/16/2017 -- the patient was seen today by the nurse practitioner at infectious disease Ms. Dixon. 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 He has also been advise Lotrimin cream locally. she also had wise IV antibiotics for his condition if it flares up 06/23/17; patient arrives today with drainage bilaterally although the remaining wound on the left posterior calf after cleaning up today "highlighter yellow drainage" did not  look too bad. Unfortunately he has had breakdown on the right anterior leg [previously this leg had not been open and he is using a black stocking] he went to see infectious disease and is been  put on clindamycin 150 mg daily, I did not verify the dose although I'm not familiar with using clindamycin in this dosing range, perhaps for prophylaxisoo 06/27/17; I brought this patient back today to follow-up on the wound deterioration on the right lower leg together with surrounding cellulitis. I started him on doxycycline 4 days ago. This area looks better however he comes in today with intense cellulitis on the medial part of his left thigh. This is not have a wound in this area. Extremely tender. We've been using silver alginate to the wounds on the right lower leg left lower leg with bilateral 4 layer compression he is using his external compression pumps once a day 07/04/17; patient's left medial thigh cellulitis looks better. He has not been using his compression pumps as his insert said it was contraindicated with cellulitis. His right leg continues to make improvements all the wounds are still open. We only have one remaining wound on the left posterior calf. Using silver alginate to all open areas. He is on doxycycline which I started a week ago and should be finishing I gave him Augmentin after Thursday's visit for the severe cellulitis on the left medial thigh which fortunately looks better 07/14/17; the patient's left medial thigh cellulitis has resolved. The cellulitis in his right lower calf on the right also looks better. All of his wounds are stable to improved we've been using silver alginate he has completed the antibiotics I have given him. He has clindamycin 150 mg once a day prescribed by infectious disease for prophylaxis, I've advised him to start this now. We have been using bilateral Unna boots over silver alginate to the wound areas 07/21/17; the patient is been to see infectious  disease who noted his recurrent problems with cellulitis. He was not able to tolerate prophylactic clindamycin therefore he is on amoxicillin 500 twice a day. He also had a second daily dose of Lasix added By Dr. Oneta Rack but he is not taking this. Nor is he being completely compliant with his compression pumps a especially not this week. He has 2 remaining wounds one on the right posterior lateral lower leg and one on the left posterior medial lower leg. 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 The patient has Unna boots bilaterally. 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 He has bilateral Unna boots for compression and silver alginate to his wounds. Arrives today with his legs looking as good as I have seen him in quite some time. Not surprisingly his wounds look better as well with improvement on the right lateral leg venous insufficiency wound and also the left medial leg. He is still using the compression pumps once a day 08/11/17; both legs appear to be doing better wounds on the right lateral and left medial legs look better. Skin on the right leg quite good. He is been using silver alginate as the primary dressing. I'm going to use Anasept gel calcium alginate and maintain all the secondary dressings 08/18/17; the patient continues to actually do quite well. The area on his right lateral leg is just about closed the left medial also looks better although it is still moist in this area. His edema is well controlled we have been using Anasept gel with calcium alginate and the usual secondary  dressings, 4 layer compression and once daily use of his compression pumps "always been able to manage 09/01/17; the patient continues to do reasonably well in spite of his trip [place]. The area on the right lateral leg is epithelialized. Left is much better but still open. He has more edema and more chronic erythema on the left leg [venous inflammation] 09/08/17; he arrives today with no open wound on the right lateral leg and decently controlled edema. Unfortunately his left leg is not nearly as in his good situation as last week.he apparently had increasing edema starting on Saturday. He edema soaked through into his foot so used a plastic bag to walk around his home. The area on the medial right leg which was his open area is about the same however he has lost surface epithelium on the left lateral which is new and he has significant pain in the Achilles area of the left foot. He is already on amoxicillin chronically for prophylaxis of cellulitis in the left leg 09/15/17; he is completed a week of doxycycline and the cellulitis in the left posterior leg and Achilles area is as usual improved. He still has a lot of edema and fluid soaking through his dressings. There is no open wound on the right leg. He saw infectious disease NP today 09/22/17;As usual 1 we transition him from our compression wraps to his stockings things did not go well. He has several small open areas on the right leg. He states this was caused by the compression wrap on his skin although he did not wear this with the stockings over them. He has several superficial areas on the left leg medially laterally posteriorly. He does not have any evidence of active cellulitis especially involving the left Achilles The patient is traveling from [facility] Saturday going to [facility]. He states he isn't attempting to get an appointment with a heel objects wound center there to change his dressings. I am not completely certain whether this will work 10/06/17; the patient came in on Friday for a nurse visit and the nurse reported that his legs actually look quite good. He arrives in clinic today for his  regular follow-up visit. He has a new wound on his left third toe over the PIP probably caused by friction with his footwear. He has small areas on the left leg and a very superficial but epithelialized area on the right anterior lateral lower leg. Other than that his legs look as good as I've seen him in quite some time. We have been using silver alginate Review of systems; no chest pain no shortness of breath other than this a 10 point review of systems negative 10/20/17; seen by Dr. Meyer Russel last week. He had taken some antibiotics [doxycycline] that he had left over. Dr. Meyer Russel thought he had candida infection and declined to give him further antibiotics. He has a small wound remaining on the right lateral leg several areas on the left leg including a larger area on the left posterior several left medial and anterior and a small wound on the left lateral. The area on the left dorsal third toe looks a lot better. ROS; Gen.; no fever, respiratory no cough no sputum Cardiac no chest pain other than this 10 point review of system is negative 10/30/17; patient arrives today having fallen in the bathtub 3 days ago. It took him a while to get up. He has pain and maceration in the wounds on his left leg which have  deteriorated. He has not been using his pumps he also has some maceration on the right lateral leg. 11/03/17; patient continues to have weeping edema especially in the left leg. This saturates his dressings which were just put on on 12/27. As usual the doxycycline seems to take care of the cellulitis on his lower leg. He is not complaining of fever, chills, or other systemic symptoms. He states his leg feels a lot better on the doxycycline I gave him empirically. He also apparently gets injections at his primary doctor's officeo Rocephin for cellulitis prophylaxis. I didn't ask him about his compression pump compliance today I think that's probably marginal. Arrives in the clinic with all of his  dressings primary and secondary macerated full of fluid and he has bilateral edema 11/10/17; the patient's right leg looks some better although there is still a cluster of wounds on the right lateral. The left leg is inflamed with almost circumferential skin loss medially to laterally although we are still maintaining anteriorly. He does not have overt cellulitis there is a lot of drainage. He is not using compression pumps. We have been using silver alginate to the wound areas, there are not a lot of options here 11/17/17; the patient's right leg continues to be stable although there is still open wounds, better than last week. The inflammation in the left leg is better. Still loss of surface layer epithelium especially posteriorly. There is no overt cellulitis in the amount of edema and his left leg is really quite good, tells me he is using his compression pumps once a day. 11/24/17; patient's right leg has a small superficial wound laterally this continues to improve. The inflammation in the left leg is still improving however we have continuous surface layer epithelial loss posteriorly. There is no overt cellulitis in the amount of edema in both legs is really quite good. He states he is using his compression pumps on the left leg once a day for 5 out of 7 days 12/01/17; very small superficial areas on the right lateral leg continue to improve. Edema control in both legs is better today. He has continued loss of surface epithelialization and left posterior calf although I think this is better. We have been using silver alginate with large number of absorptive secondary dressings 4 layer on the left Unna boot on the right at his request. He tells me he is using his compression pumps once a day 12/08/17; he has no open area on the right leg is edema control is good here. On the left leg however he has marked erythema and tenderness breakdown of skin. He has what appears to be a wrap injury just distal to  the popliteal fossa. 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 There was swelling erythema and breakdown in the right leg. He has much worse skin breakdown in the left leg as well multiple open areas medially and posteriorly as well as laterally. He tells me he has been using his compression pumps but tells me he feels that the drainage out of his leg is worse when he uses a compression pumps. T be fair to him he is been saying this o for a while however I don't know that I  have really been listening to this. I wonder if the compression pumps are working properly 12/22/17;. Once again he arrives with severe erythema, weeping edema from the left greater than right leg. Noncompliance with compression pumps. New this visit he is complaining of pain on the lateral aspect of the right leg and the medial aspect of his right thigh. He apparently saw his cardiologist Dr. Rennis GoldenHilty who was ordered an echocardiogram area and I think this is a step in the right direction 12/25/17; started his doxycycline Monday night. There is still intense erythema of the right leg especially in the anterior thigh although there is less tenderness. The erythema around the wound on the right lateral calf also is less tender. He still complaining of pain in the left heel. His wounds are about the same right lateral left medial left lateral. Superficial but certainly not close to closure. He denies being systemically unwell no fever chills no abdominal pain no diarrhea 12/29/17; back in follow-up of his extensive right calf and right thigh cellulitis. I added amoxicillin to cover possible doxycycline resistant strep. This seems to of done the trick he is in much  less pain there is much less erythema and swelling. He has his echocardiogram at 11:00 this morning. X-ray of the left heel was also negative. 01/05/18; the patient arrived with his edema under much better control. Now that he is retired he is able to use his compression pumps daily and sometimes twice a day per the patient. He has a wound on the right leg the lateral wound looks better. Area on the left leg also looks a lot better. He has no evidence of cellulitis in his bilateral thighs I had a quick peak at his echocardiogram. He is in normal ejection fraction and normal left ventricular function. He has moderate pulmonary hypertension moderately reduced right ventricular function. One would have to wonder about chronic sleep apnea although he says he doesn't snore. He'll review the echocardiogram with his cardiologist. 01/12/18; the patient arrives with the edema in both legs under exemplary control. He is using his compression pumps daily and sometimes twice daily. His wound on the right lateral leg is just about closed. He still has some weeping areas on the posterior left calf and lateral left calf although everything is just about closed here as well. I have spoken with Aldean BakerStephanie Dickson who is the patient's nurse practitioner and infectious disease. She was concerned that the patient had not understood that the parenteral penicillin injections he was receiving for cellulitis prophylaxis was actually benefiting him. I don't think the patient actually saw that I would tend to agree we were certainly dealing with less infections although he had a serious one last month. 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 He is healed. He'll be placed in juxtalite compression wraps and increase his lymphedema pumps to twice daily. We will follow up again next week to ensure there are no issues with the new regiment. 01/20/18-he is here for evaluation of bilateral lower  extremity weeping edema. Yesterday he was placed in compression wrap to the right lower extremity and compression stocking to left lower shrubbery. He states he uses lymphedema pumps last night and again this morning and noted a blister to the left lower extremity. On exam he was noted to have drainage to the right lower extremity. He will be placed in Unna boots bilaterally and follow-up next week 01/26/18; patient was actually discharged a week ago to his  own juxta light stockings only to return the next day with bilateral lower extremity weeping edema.he was placed in bilateral Unna boots. He arrives today with pain in the back of his left leg. There is no open area on the right leg however there is a linear/wrap injury on the left leg and weeping edema on the left leg posteriorly. I spoke with infectious disease about 10 days ago. They were disappointed that the patient elected to discontinue prophylactic intramuscular penicillin shots as they felt it was particularly beneficial in reducing the frequency of his cellulitis. I discussed this with the patient today. He does not share this view. He'll definitely need antibiotics today. Finally he is traveling to [place] and trauma leaving this Saturday and returning a week later and he does not travel with his pumps. He is going by car 01/30/18; patient was seen 4 days ago and brought back in today for review of cellulitis in the left leg posteriorly. I put him on amoxicillin this really hasn't helped as much as I might like. He is also worried because he is traveling to [facility] trauma by car. Finally we will be rewrapping him. There is no open area on the right leg over his left leg has multiple weeping areas as usual 02/09/18; The same wrap on for 10 days. He did not pick up the last doxycycline I prescribed for him. He apparently took 4 days worth he already had. There is nothing open on his right leg and the edema control is really quite good. He's  had damage in the left leg medially and laterally especially probably related to the prolonged use of Unna boots 02/12/18; the patient arrived in clinic today for a nurse visit/wrap change. He complained of a lot of pain in the left posterior calf. He is taking doxycycline that I previously prescribed for him. Unfortunately even though he used his stockings and apparently used to compression pumps twice a day he has weeping edema coming out of the lateral part of his right leg. This is coming from the lower anterior lateral skin area. 02/16/18; the patient has finished his doxycycline and will finish the amoxicillin 2 days. The area of cellulitis in the left calf posteriorly has resolved. He is no longer having any pain. He tells me he is using his compression pumps at least once a day sometimes twice. 02/23/18; the patient finished his doxycycline and Amoxil last week. On Friday he noticed a small erythematous circle about the size of a quarter on the left lower leg just above his ankle. This rapidly expanded and he now has erythema on the lateral and posterior part of the thigh. This is bright red. Also has an area on the dorsal foot just above his toes and a tender area just below the left popliteal fossa. He came off his prophylactic penicillin injections at his own insistence one or 2 months ago. This is obviously deteriorated since then 03/02/18; patient is on doxycycline and Amoxil. Culture I did last week of the weeping area on the back of his left calf grew group B strep. I have therefore renewed the amoxicillin 500 3 times a day for a further week. He has not been systemically unwell. Still complaining of an area of discomfort right under his left popliteal fossa. There is no open wound on the right leg. He tells me that he is using his pumps twice a day on most days 03/09/18; patient arrives in clinic today completing his amoxicillin today. The  cellulitis on his left leg is better. Furthermore he  tells me that he had intramuscular penicillin shots that his primary care office today. However he also states that the wrap on his right leg fell down shortly after leaving clinic last week. He developed a large blister that was present when he came in for a nurse visit later in the week and then he developed intense discomfort around this area.He tells me he is using his compression pumps 03/16/18; the patient has completed his doxycycline. The infectious part of this/cellulitis in the left heel area left popliteal area is a lot better. He has 2 open areas on the right calf. Still areas on the left calf but this is a lot better as well. 03/24/18; the patient arrives complaining of pain in the left popliteal area again. He thinks some of this is wrap injury. He has no open area on the right leg and really no open area on the left calf either except for the popliteal area. He claims to be compliant with the compression pumps 03/31/18; I gave him doxycycline last week because of cellulitis in the left popliteal area. This is a lot better although the surface epithelium is denuded off and response to this. He arrives today with uncontrolled edema in the right calf area as well as a fingernail injury in the right lateral calf. There is only a few open areas on the left 04/06/18; I gave him amoxicillin doxycycline over the last 2 weeks that the amoxicillin should be completing currently. He is not complaining of any pain or systemic symptoms. The only open areas see has is on the right lateral lower leg paradoxically I cannot see anything on the left lower leg. He tells me he is using his compression pumps twice a day on most days. Silver alginate to the wounds that are open under 4 layer compression 04/13/18; he completed antibiotics and has no new complaints. Using his compression pumps. Silver alginate that anything that's opened 04/20/18; he is using his compression pumps religiously. Silver alginate 4 layer  compression anything that's opened. He comes in today with no open wounds on the left leg but 3 on the right including a new one posteriorly. He has 2 on the right lateral and one on the right posterior. He likes Unna boots on the right leg for reasons that aren't really clear we had the usual 4 layer compression on the left. It may be necessary to move to the 4 layer compression on the right however for now I left them in the Unna boots 04/27/18; he is using his compression pumps at least once a day. He has still the wounds on the right lateral calf. The area right posteriorly has closed. He does not have an open wound on the left under 4 layer compression however on the dorsal left foot just proximal to the toes and the left third toe 2 small open areas were identified 05/11/18; he has not uses compression pumps. The areas on the right lateral calf have coalesced into one large wound necrotic surface. On the left side he has one small wound anteriorly however the edema is now weeping out of a large part of his left leg. He says he wasn't using his pumps because of the weeping fluid. I explained to him that this is the time he needs to pump more 05/18/18; patient states he is using his compression pumps twice a day. The area on the right lateral large wound albeit superficial.  On the left side he has innumerable number of small new wounds on the left calf particularly laterally but several anteriorly and medially. All these appear to have healthy granulated base these look like the remnants of blisters however they occurred under compression. The patient arrives in clinic today with his legs somewhat better. There is certainly less edema, less multiple open areas on the left calf and the right anterior leg looks somewhat better as well superficial and a little smaller. However he relates pain and erythema over the last 3-4 days in the thigh and I looked at this today. He has not been systemically unwell  no fever no chills no change in blood sugar values 05/25/18; comes in today in a better state. The severe cellulitis on his left leg seems better with the Keflex. Not as tender. He has not been systemically unwell Hard to find an open wound on the left lower leg using his compression pumps twice a day The confluent wounds on his right lateral calf somewhat better looking. These will ultimately need debridement I didn't do this today. 06/01/18; the severe cellulitis on the left anterior thigh has resolved and he is completed his Keflex. There is no open wound on the left leg however there is a superficial excoriation at the base of the third toe dorsally. Skin on the bottom of his left foot is macerated looking. The left the wounds on the lateral right leg actually looks some better although he did require debridement of the top half of this wound area with an open curet 06/09/18 on evaluation today patient appears to be doing poorly in regard to his right lower extremity in particular this appears to likely be infected he has very thick purulent discharge along with a bright green tent to the discharge. This makes me concerned about the possibility of pseudomonas. He's also having increased discomfort at this point on evaluation. Fortunately there does not appear to be any evidence of infection spreading to the other location at this time. 06/16/18 on evaluation today patient appears to actually be doing fairly well. His ulcer has actually diminished in size quite significantly at this point which is good news. Nonetheless he still does have some evidence of infection he did see infectious disease this morning before coming here for his appointment. I did review the results of their evaluation and their note today. They did actually have him discontinue the Cipro and initiate treatment with linezolid at this time. He is doing this for the next seven days and they recommended a follow-up in four months with  them. He is the keep a log of the need for intermittent antibiotic therapy between now and when he falls back up with infectious disease. This will help them gaze what exactly they need to do to try and help them out. 06/23/18; the patient arrives today with no open wounds on the left leg and left third toe healed. He is been using his compression pumps twice a day. On the right lateral leg he still has a sizable wound but this is a lot better than last time I saw this. 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 Has been using silver alginate under 4 layer compression 06/30/18; the only open wound he has is on the right lateral leg and this looks healthy. No debridement is required. We have been using silver alginate. He does not  have an open wound on the left leg. There is apparently some drainage from the dorsal proximal third toe on the left although I see no open wound here. 07/03/18 on evaluation today patient was actually here just for a nurse visit rapid change. However when he was here on Wednesday for his rat change due to having been healed on the left and then developing blisters we initiated the wrap again knowing that he would be back today for [place] to reevaluate and see were at. Unfortunately he has developed some cellulitis into the proximal portion of his right lower extremity even into the region of his thigh. 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 He was placed on one as what at that point. Nonetheless he is done with that and has been tolerating it well otherwise. Doxycycline which in the past really did not seem to be effective for him. Nonetheless I think the best option may be for [place] to definitely reinitiate the antibiotics for a longer period of time. 07/07/18; since I last saw this patient a week ago he has had a difficult time. At that  point he did not have an open wound on his left leg. We transitioned him into juxta light stockings. He was apparently in the clinic the next day with blisters on the left lateral and left medial lower calf. He also had weeping edema fluid. He was put back into a compression wrap. He was also in the clinic on Friday with intense erythema in his right thigh. Per the patient he was started on Bactrim however that didn't work at all in terms of relieving his pain and swelling. He has taken 3 doxycycline that he had left over from last time and that seems to of helped. He has blistering on the right thigh as well. 07/14/18; the erythema on his right thigh has gotten better with doxycycline that he is finishing. 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 Presumably this cellulitis in the thigh was not related to that although I think this is a bit concerning going forward. He still has an area on the right lateral calf the blister on the right medial calf just below the knee that was discussed above. On the left 2 small open areas left medial and left lateral. Edema control is adequate. He is using his compression pumps twice a day 07/20/18; continued improvement in the condition of both legs especially the edema in his bilateral thighs. He tells me he is been losing weight through a combination of diet and exercise. He is using his compression pumps twice a day. So overall she made to the remaining wounds 07/27/2018; continued improvement in condition of both legs. His edema is well controlled. The area on the right lateral leg is just about closed he had one blisters show up on the medial left upper calf. We have him in 4 layer compression. He is going on a 10-day trip to [place], [place] and [place]. He will be driving. He wants to wear Unna boots because of the lessening amount of constriction. He will not use compression  pumps while he is away 08/05/18 on evaluation today patient actually appears to be doing decently well all things considered in regard to his bilateral lower extremities. The worst ulcer is actually only posterior aspect of his left lower extremity with a four  layer compression wrap cut into his leg a couple weeks back. He did have a trip and actually had [employer] for the trip that he is worn since he was last here. Nonetheless he feels like the [employer] actually do better for him his swelling is up a little bit but he also with his trip was not taking his Lasix on a regular set schedule like he was supposed to be. He states that obviously the reason being that he cannot drive and keep going without having to urinate too frequently which makes it difficult. He did not have his pumps with him while he was away either which I think also maybe playing a role here too. 08/13/2018; the patient only has a small open wound on the right lateral calf which is a big improvement in the last month or 2. He also has the area posteriorly just below the posterior fossa on the left which I think was a wrap injury from several weeks ago. He has no current evidence of cellulitis. He tells me he is back into his compression pumps twice a day. He also tells me that while he was at the laundromat somebody stole a section of his extremitease stockings 08/20/2018; back in the clinic with a much improved state. He only has small areas on the right lateral mid calf which is just about healed. This was is more substantial area for quite a prolonged period of time. He has a small open area on the left anterior tibia. The area on the posterior calf just below the popliteal fossa is closed today. He is using his compression pumps twice a day 08/28/2018; patient has no open wound on the right leg. He has a smattering of open areas on the calf with some weeping lymphedema. More problematically than that it looks as  though his wraps of slipped down in his usual he has very angry upper area of edema just below the right medial knee and on the right lateral calf. He has no open area on his feet. The patient is traveling to [facility] next week. I will send him in an antibiotic. We will continue to wrap the right leg. We ordered extremitease stockings for him last week and I plan to transition the right leg to a stocking when he gets home which will be in 10 days time. As usual he is very reluctant to take his pumps with him when he travels 09/07/2018; patient returns from [facility]. He shows me a picture of his left leg in the mid part of his trip last week with intense fire engine erythema. The picture look bad enough I would have considered sending him to the hospital. Instead he went to the wound care center in [facility]. They did not prescribe him antibiotics but he did take some doxycycline he had leftover from a previous visit. I had given him trimethoprim sulfamethoxazole before he left this did not work according to the patient. This is resulted in some improvement fortunately. He comes back with a large wound on the left posterior calf. Smaller area on the left anterior tibia. Denuded blisters on the dorsal left foot over his toes. Does not have much in the way of wounds on the right leg although he does have a very tender area on the right posterior area just below the popliteal fossa also suggestive of infection. He promises me he is back on his pumps twice a day 09/15/2018; the intense  cellulitis in his left lower calf is a lot better. The wound area on the posterior left calf is also so better. However he has reasonably extensive wounds on the dorsal aspect of his second and third toes and the proximal foot just at the base of the toes. There is nothing open on the right leg 09/22/2018; the patient has excellent edema control in his legs bilaterally. He is using his external compression  pumps twice a day. He has no open area on the right leg and only the areas in the left foot dorsally second and third toe area on the left side. He does not have any signs of active cellulitis. 10/06/2018; the patient has good edema control bilaterally. He has no open wound on the right leg. There is a blister in the posterior aspect of his left calf that we had to deal with today. He is using his compression pumps twice a day. There is no signs of active cellulitis. We have been using silver alginate to the wound areas. He still has vulnerable areas on the base of his left first second toes dorsally He has a his extremities stockings and we are going to transition him today into the stocking on the right leg. He is cautioned that he will need to continue to use the compression pumps twice a day. If he notices uncontrolled edema in the right leg he may need to go to 3 times a day. 10/13/2018; the patient came in for a nurse check on Friday he has a large flaccid blister on the right medial calf just below the knee. We unroofed this. He has this and a new area underneath the posterior mid calf which was undoubtedly a blister as well. He also has several small areas on the right which is the area we put his extremities stocking on. 10/19/2018; the patient went to see infectious disease this morning I am not sure if that was a routine follow-up in any case the doxycycline I had given him was discontinued and started on linezolid. He has not started this. It is easy to look at his left calf and the inflammation and think this is cellulitis however he is very tender in the tissue just below the popliteal fossa and I have no doubt that there is infection going on here. He states the problem he is having is that with the compression pumps the edema goes down and then starts walking the wrap falls down. We will see if we can adhere this. He has 1 or 2 minuscule open areas on the right still areas that are  weeping on the posterior left calf, the base of his left second and third toes 10/26/18; back today in clinic with quite of skin breakdown in his left anterior leg. This may have been infection the area below the popliteal fossa seems a lot better however tremendous epithelial loss on the left anterior mid tibia area over quite inexpensive tissue. He has 2 blisters on the right side but no other open wound here. 10/29/2018; came in urgently to see [place] today and we worked him in for review. He states that the 4 layer compression on the right leg caused pain he had to cut it down to roughly his mid calf this caused swelling above the wrap and he has blisters and skin breakdown today. As a result of the pain he has not been using his pumps. Both legs are a lot more edematous and there is a lot of  weeping fluid. 11/02/18; arrives in clinic with continued difficulties in the right leg> left. Leg is swollen and painful. multiple skin blisters and new open areas especially laterally. He has not been using his pumps on the right leg. 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 He is not systemically unwell. 11/09/2018; the patient claims he is being compliant with his pumps. He is finished the doxycycline I gave him last week. Culture I did of the wound on the right lateral leg showed a few very resistant methicillin staph aureus. This was resistant to doxycycline. Nevertheless he states the pain in the leg is a lot better which makes me wonder if the cultured organism was not really what was causing the problem nevertheless this is a very dangerous organism to be culturing out of any wound. His right leg is still a lot larger than the left. He is using an Radio broadcast assistant on this area, he blames a 4-layer compression for causing the original skin breakdown which I doubt is true however I cannot talk him out of it. We have been using silver alginate to all of these areas  which were initially blisters 11/16/2018; patient is being compliant with his external compression pumps at twice a day. Miraculously he arrives in clinic today with absolutely no open wounds. He has better edema control on the left where he has been using 4 layer compression versus wound of wounds on the right and I pointed this out to him. There is no inflammation in the skin in his lower legs which is also somewhat unusual for him. There is no open wounds on the dorsal left foot. He has extremitease stockings at home and I have asked him to bring these in next week. 11/25/18 patient's lower extremity on examination today on the left appears for the most part to be wound free. He does have an open wound on the lateral aspect of the right lower extremity but this is minimal compared to what I've seen in past. He does request that we go ahead and wrap the left leg as well even though there's nothing open just so hopefully it will not reopen in short order. 1/28; patient has superficial open wounds on the right lateral calf left anterior calf and left posterior calf. His edema control is adequate. 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 We have been using silver alginate as the primary dressing. He claims compliance with his compression pumps 2/4; patient has superficial open wounds on numerous areas of his left calf and again one on the left dorsal foot. The areas on the right lateral calf have healed. The cellulitis that I gave him doxycycline for last week is also resolved this was mostly on the left anterior calf just below the tibial tuberosity. His edema looks fairly well-controlled. He tells me he went to see his primary doctor today and had blood work ordered 2/11; once again he has several open areas on the left calf left tibial area. Most of these are small and appear to have healthy granulation. He does not  have anything open on the right. The edema and control in his thighs is pretty good which is usually a good indication he has been using his pumps as requested. 2/18; he continues to have several small areas on the left calf and left tibial area. Most of these are small healthy  granulation. We put him in his stocking on the right leg last week and he arrives with a superficial open area over the right upper tibia and a fairly large area on the right lateral tibia in similar condition. His edema control actually does not look too bad, he claims to be using his compression pumps twice a day 2/25. Continued small areas on the left calf and left tibial area. New areas especially on the right are identified just below the tibial tuberosity and on the right upper tibia itself. There are also areas of weeping edema fluid even without an obvious wound. He does not have a considerable degree of lymphedema but clearly there is more edema here than his skin can handle. He states he is using the pumps twice a day. We have an Unna boot on the right and 4 layer compression on the left. 3/3; he continues to have an area on the right lateral calf and right posterior calf just below the popliteal fossa. There is a fair amount of tenderness around the wound on the popliteal fossa but I did not see any evidence of cellulitis, could just be that the wrap came down and rubbed in this area. He does not have an open area on the left leg however there is an area on the left dorsal foot at the base of the third toe We have been using silver alginate to all wound areas 3/10; he did not have an open area on his left leg last time he was here a week ago. T oday he arrives with a horizontal wound just below the tibial tuberosity and an area on the left lateral calf. He has intense erythema and tenderness in this area. The area is on the right lateral calf and right posterior calf better than last week. We have been using silver  alginate as usual 3/18 - Patient returns with 3 small open areas on left calf, and 1 small open area on right calf, the skin looks ok with no significant erythema, he continues the UNA boot on right and 4 layer compression on left. The right lateral calf wound is closed , the right posterior is small area. we will continue silver alginate to the areas. 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 He is been tolerating the dressing changes without complication. Fortunately she has made excellent improvement in regard to the overall status of his wounds. Unfortunately every time we cease wrapping him he ends up reopening in causing more significant issues at that point. Again I'm unsure of the best direction to take although I think the lymphedema clinic may be appropriate for him. 02/03/19 on evaluation today patient appears to be doing well in regard to the wounds that we saw him for last week unfortunately he has a new area on the proximal portion of his right medial/posterior lower extremity where the wrap somewhat slowed down and caused swelling and a blister to rub and open. Unfortunately this is the only opening that he has on either leg at this point. 02/17/19 on evaluation today patient's bilateral lower extremities appear to be doing well. He still completely healed in regard to the left lower extremity. In regard to the right lower extremity the area where the wrap and slid down and caused the blister still seems to  be slightly open although this is dramatically better than during the last evaluation two weeks ago. I'm very pleased with the way this stands overall. 03/03/19 on evaluation today patient appears to be doing well in regard to his right lower extremity in general although he did have a new  blister open this does not appear to be showing any evidence of active infection at this time. Fortunately there's No fevers, chills, nausea, or vomiting noted at this time. Overall I feel like he is making good progress it does feel like that the right leg will we perform the [employer] seems to do with a bit better than three layer wrap on the left which slid down on him. We may switch to doing bilateral in the book wraps. 5/4; I have not seen Mr. Russett in quite some time. According to our case manager he did not have an open wound on his left leg last week. He had 1 remaining wound on the right posterior medial calf. He arrives today with multiple openings on the left leg probably were blisters and/or wrap injuries from Unna boots. I do not think the Unna boot's will provide adequate compression on the left. I am also not clear about the frequency he is using the compression pumps. 03/17/19 on evaluation today patient appears to be doing excellent in regard to his lower extremities compared to last week's evaluation apparently. He had gotten significantly worse last week which is unfortunate. The [employer] wrap on the left did not seem to do very well for him at all and in fact it didn't control his swelling significantly enough he had an additional outbreak. Subsequently we go back to the four layer compression wrap on the left. This is good news. At least in that he is doing better and the wound seem to be killing him. He still has not heard anything from the lymphedema clinic. 03/24/19 on evaluation today patient actually appears to be doing much better in regard to his bilateral lower Trinity as compared to last week when I saw him. Fortunately there's no signs of active infection at this time. He has been tolerating the dressing changes without complication. Overall I'm extremely pleased with the progress and appearance in general. 04/07/19 on evaluation today patient appears to be doing well in  regard to his bilateral lower extremities. His swelling is significantly down from where it was previous. With that being said he does have a couple blisters still open at this point but fortunately nothing that seems to be too severe and again the majority of the larger openings has healed at this time. 04/14/19 on evaluation today patient actually appears to be doing quite well in regard to his bilateral lower extremities in fact I'm not even sure there's anything significantly open at this time at any site. Nonetheless he did have some trouble with these wraps where they are somewhat irritating him secondary to the fact that he has noted that the graph wasn't too close down to the end of this foot in a little bit short as well up to his knee. Otherwise things seem to be doing quite well. 04/21/19 upon evaluation today patient's wound bed actually showed evidence of being completely healed in regard to both lower extremities which is excellent news. There does not appear to be any signs of active infection which is also good news. I'm very pleased in this regard. No fevers, chills, nausea, or vomiting noted at this time. 04/28/19 on evaluation today  patient appears to be doing a little bit worse in regard to both lower extremities on the left mainly due to the fact that when he went infection disease the wrap was not wrapped quite high enough he developed a blister above this. On the right he is a small open area of nothing too significant but again this is continuing to give him some trouble he has been were in the Velcro compression that he has at home. 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 He's been tolerating the dressing changes without complication. Fortunately there's no signs of active infection at this time. No fevers, chills, nausea, or vomiting noted at this time. We have been trying to get an appointment with her lymphedema  clinic in [facility] but unfortunately nobody can get them on phone with not been able to even fax information over the patient likewise is not been able to get in touch with them. Overall I'm not sure exactly what's going on here with to reach out again today. 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 Still having a lot of drainage unfortunately. He tells me especially in the left but even on the right. There's no signs of active infection which is good news we've been using so ratcheted up to this point. 05/19/19 on evaluation today patient actually appears to be doing quite well with regard to his left lower extremity which is great news. Fortunately in regard to the right lower extremity has an issues with his wrap and he subsequently did remove this from what I'm understanding. Nonetheless long story short is what he had rewrapped once he removed it subsequently had maggots underneath this wrap whenever he came in for evaluation today. With that being said they were obviously completely cleaned away by the nursing staff. The visit today which is excellent news. However he does appear to potentially have some infection around the right ankle region where the maggots were located as well. He will likely require anabiotic therapy today. 05/26/19 on evaluation today patient actually appears to be doing much better in regard to his bilateral lower extremities. I feel like the infection is under much better control. With that being said there were maggots noted when the wrap was removed yet again today. Again this could have potentially been left over from previous although at this time there does not appear to be any signs of significant drainage there was obviously on the wrap some drainage as well this contracted gnats or otherwise. Either way I do not see anything that appears to be doing worse in my  pinion and in fact I think his drainage has slowed down quite significantly likely mainly due to the fact to his infection being under better control. 06/02/2019 on evaluation today patient actually appears to be doing well with regard to his bilateral lower extremities there is no signs of active infection at this time which is great news. With that being said he does have several open areas more so on the right than the left but nonetheless these are all significantly better than previously noted. 06/09/2019 on evaluation today patient actually appears to be doing well. His wrap stayed up and he did not cause any problems he had more drainage on the right compared to the left but overall I do not see any major issues at this time which  is great news. 06/16/2019 on evaluation today patient appears to be doing excellent with regard to his lower extremities the only area that is open is a new blister that can have opened as of today on the medial ankle on the left. Other than this he really seems to be doing great I see no major issues at this point. 06/23/2019 on evaluation today patient appears to be doing quite well with regard to his bilateral lower extremities. In fact he actually appears to be almost completely healed there is a small area of weeping noted of the right lower extremity just above the ankle. Nonetheless fortunately there is no signs of active infection at this time which is good news. No fevers, chills, nausea, vomiting, or diarrhea. 8/24; the patient arrived for a nurse visit today but complained of very significant pain in the left leg and therefore I was asked to look at this. Noted that he did not have an open area on the left leg last week nevertheless this was wrapped. The patient states that he is not been able to put his compression pumps on the left leg because of the discomfort. He has not been systemically unwell 06/30/2019 on evaluation today patient unfortunately despite  being excellent last week is doing much worse with regard to his left lower extremity today. In fact he had to come in for a nurse on Monday where his left leg had to be rewrapped due to excessive weeping Dr. Leanord Hawking placed him on doxycycline at that point. Fortunately there is no signs of active infection Systemically at this time which is good news. 07/07/2019 in regard to the patient's wounds today he actually seems to be doing well with his right lower extremity there really is nothing open or draining at this point this is great news. Unfortunately the left lower extremity is given him additional trouble at this time. There does not appear to be any signs of active infection nonetheless he does have a lot of edema and swelling noted at this point as well as blistering all of which has led to a much more poor appearing leg at this time compared to where it was 2 weeks ago when it was almost completely healed. Obviously this is a little discouraging for the patient. He is try to contact the lymphedema clinic in [place] he has not been able to get through to them. 07/14/2019 on evaluation today patient actually appears to be doing slightly better with regard to his left lower extremity ulcers. Overall I do feel like at least at the top of the wrap that we have been placing this area has healed quite nicely and looks much better. The remainder of the leg is showing signs of improvement. Unfortunately in the thigh area he still has an open region on the left and again on the right he has been utilizing just a Band-Aid on an area that also opened on the thigh. Again this is an area that were not able to wrap although we did do an Ace wrap to provide some compression that something that obviously is a little less effective than the compression wraps we have been using on the lower portion of the leg. He does have an appointment with the lymphedema clinic in [facility] on Friday. 07/21/2019 on  evaluation today patient appears to be doing better with regard to his lower extremity ulcers. He has been tolerating the dressing changes without complication. Fortunately there is no signs of active infection at this  time. No fevers, chills, nausea, vomiting, or diarrhea. I did receive the paperwork from the physical therapist at the lymphedema clinic in [place]. Subsequently I signed off on that this morning and sent that back to him for further progression with the treatment plan. 07/28/2019 on evaluation today patient appears to be doing very well with regard to his right lower extremity where I do not see any open wounds at this point. Fortunately he is feeling great as far as that is concerned as well. In regard to the left lower extremity he has been having issues with still several areas of weeping and edema although the upper leg is doing better his lower leg still I think is going require the compression wrap at this time. No fevers, chills, nausea, vomiting, or diarrhea. 08/04/2019 on evaluation today patient unfortunately is having new wounds on the right lower extremity. Again we have been using Unna boot wrap on that side. We switched him to using his juxta lite wrap at home. With that being said he tells me he has been using it although his legs extremely swollen and to be honest really does not appear that he has been. I cannot know that for sure however. Nonetheless he has multiple new wounds on the right lower extremity at this time. Obviously we will have to see about getting this rewrapped for him today. 08/11/2019 on evaluation today patient appears to be doing fairly well with regard to his wounds. He has been tolerating the dressing changes including the compression wraps without complication. He still has a lot of edema in his upper thigh regions bilaterally he is supposed to be seeing the lymphedema clinic on the 15th of this month once his wraps arrive for the upper part  of his legs. 08/18/2019 on evaluation today patient appears to be doing well with regard to his bilateral lower extremities at this point. He has been tolerating the dressing changes without complication. Fortunately there is no signs of active infection which is also good news. He does have a couple weeping areas on the first and second toe of the right foot he also has just a small area on the left foot upper leg and a small area on the left lower leg but overall he is doing quite well in my opinion. He is supposed to be getting his wraps shortly in fact tomorrow and then subsequently is seeing the lymphedema clinic next Wednesday on the 21st. Of note he is also leaving on the 25th to go on vacation for a week to the beach. For that reason and since there is some uncertainty about what there can be doing at lymphedema clinic next Wednesday I am get a make an appointment for next Friday here for [place] to see what we need to do for him prior to him leaving for vacation. 10/23; patient arrives in considerable pain predominantly in the upper posterior calf just distal to the popliteal fossa also in the wound anteriorly above the major wound. This is probably cellulitis and he has had this recurrently in the past. He has no open wound on the right side and he has had an Radio broadcast assistant in that area. Finally I note that he has an area on the left posterior calf which by enlarge is mostly epithelialized. This protrudes beyond the borders of the surrounding skin in the setting of dry scaly skin and lymphedema. The patient is leaving for [facility] on Sunday. Per his longstanding pattern, he will not take  his compression pumps with him predominantly out of fear that they will be stolen. He therefore asked that we put a Unna boot back on the right leg. He will also contact the wound care center in [facility] to see if they can change his dressing in the mid week. 11/3; patient returned from his vacation to [facility]. He was seen on 1 occasion at their wound care center. They did a 2 layer compression system as they did not have our 4-layer wrap. I am not completely certain what they put on the wounds. They did not change the Unna boot on the right. The patient is also seeing a lymphedema specialist physical therapist in [place]. It appears that he has some compression sleeve for his thighs which indeed look quite a bit better than I am used to seeing. He pumps over these with his external compression pumps. 11/10; the patient has a new wound on the right medial thigh otherwise there is no open areas on the right. He has an area on the left leg posteriorly anteriorly and medially and an area over the left second toe. We have been using silver alginate. He thinks the injury on his thigh is secondary to friction from the compression sleeve he has. 11/17; the patient has a new wound on the right medial thigh last week. He thinks this is because he did not have a underlying stocking for his thigh juxta lite apparatus. He now has this. The area is fairly large and somewhat angry but I do not think he has underlying cellulitis. He has a intact blister on the right anterior tibial area. Small wound on the right great toe dorsally Small area on the medial left calf. 11/30; the patient does not have any open areas on his right leg and we did not take his juxta lite stocking off. However he states that on Friday his compression wrap fell down lodging around his upper mid calf area. As usual this creates a lot of problems for him. He called urgently today to be seen for a nurse visit however the nurse visit turned into a provider visit because of extreme erythema and pain in the left anterior tibia extending laterally and posteriorly. The area that is problematic is extensive 10/06/2019 upon evaluation today patient actually appears to be doing poorly in regard to his left lower extremity. He Dr. Leanord Hawking did place  him on doxycycline this past Monday apparently due to the fact that he was doing much worse in regard to this left leg. Fortunately the doxycycline does seem to be helping. Unfortunately we are still having a very difficult time getting his edema under any type of control in order to anticipate discharge at some point. The only way were really able to control his lymphedema really is with compression wraps and that has only even seemingly temporary. He has been seeing a lymphedema clinic they are trying to help in this regard but still this has been somewhat frustrating in general for the patient. 10/13/19 on evaluation today patient appears to be doing excellent with regard to his right lower extremity as far as the wounds are concerned. His swelling is still quite extensive unfortunately. He is still having a lot of drainage from the thigh areas bilaterally which is unfortunate. He's been going to lymphedema clinic but again he still really does not have this edema under control as far as his lower extremities are concern. With regard to his left lower extremity this seems  to be improving and I do believe the doxycycline has been of benefit for him. He is about to complete the doxycycline. 10/20/2019 on evaluation today patient appears to be doing poorly in regard to his bilateral lower extremities. More in the right thigh he has a lot of irritation at this site unfortunately. In regard to the left lower extremity the wrap was not quite as high it appears and does seem to have caused him some trouble as well. Fortunately there is no evidence of systemic infection though he does have some blue-green drainage which has me concerned for the possibility of Pseudomonas. He tells me he is previously taking Cipro without complications and he really does not care for Levaquin however due to some of the side effects he has. He is not allergic to any medications specifically antibiotics that were aware  of. 10/27/2019 on evaluation today patient actually does appear to be for the most part doing better when compared to last week's evaluation. With that being said he still has multiple open wounds over the bilateral lower extremities. He actually forgot to start taking the Cipro and states that he still has the whole bottle. He does have several new blisters on left lower extremity today I think I would recommend he go ahead and take the Cipro based on what I am seeing at this point. 12/30-Patient comes at 1 week visit, 4 layer compression wraps on the left and Unna boot on the right, primary dressing Xtrasorb and silver alginate. Patient is taking his Cipro and has a few more days left probably 5-6, and the legs are doing better. He states he is using his compressions devices which I believe he has 11/10/2019 on evaluation today patient actually appears to be much better than last time I saw him 2 weeks ago. His wounds are significantly improved and overall I am very pleased in this regard. Fortunately there is no signs of active infection at this time. He is just a couple of days away from completing Cipro. Overall his edema is much better he has been using his lymphedema pumps which I think is also helping at this point. 11/17/2019 on evaluation today patient appears to be doing excellent in regard to his wounds in general. His legs are swollen but not nearly as much as they have been in the past. Fortunately he is tolerating the compression wraps without complication. No fevers, chills, nausea, vomiting, or diarrhea. He does have some erythema however in the distal portion of his right lower extremity specifically around the forefoot and toes there is a little bit of warmth here as well. 11/24/2019 on evaluation today patient appears to be doing well with regard to his right lower extremity I really do not see any open wounds at this point. His left lower extremity does have several open areas and his  right medial thigh also is open. Other than this however overall the patient seems to be making good progress and I am very pleased at this point. 12/01/2019 on evaluation today patient appears to be doing poorly at this point in regard to his left lower extremity has several new blisters despite the fact that we have him in compression wraps. In fact he had a 4-layer compression wrap, his upper thigh wrapped from lymphedema clinic, and a juxta light over top of the 4 layer compression wrap the lymphedema clinic applied and despite all this he still develop blisters underneath. Obviously this does have me concerned about the fact that  unfortunately despite what we are doing to try to get wounds healed he continues to have new areas arise I do not think he is ever good to be at the point where he can realistically just use wraps at home to keep things under control. Typically when we heal him it takes about 1-2 days before he is back in the clinic with severe breakdown and blistering of his lower extremities bilaterally. This is happened numerous times in the past. Unfortunately I think that we may need some help as far as overall fluid overload to kind of limit what we are seeing and get things under better control. 12/08/2019 on evaluation today patient presents for follow-up concerning his ongoing bilateral lower extremity edema. Unfortunately he is still having quite a bit of swelling the compression wraps are controlling this to some degree but he did see Dr. [name] his cardiologist I do have that available for review today as far as the appointment was concerned that was on 12/06/2019. Obviously that she has been 2 days ago. The patient states that he is only been taking the Lasix 80 mg 1 time a day he had told me previously he was taking this twice a day. Nonetheless Dr. [name] recommended this be up to 80 mg 2 times a day for the patient as he did appear to be fluid overloaded. With that being said the  patient states he did this yesterday and he was unable to go anywhere or do anything due to the fact that he was constantly having to urinate. Nonetheless I think that this is still good to be something that is important for him as far as trying to get his edema under control at all things that he is going to be able to just expect his wounds to get under control and things to be better without going through at least a period of time where he is trying to stabilize his fluid management in general and I think increasing the Lasix is likely the first step here. It was also mentioned the possibility that the patient may require metolazone. With that being said he wanted to have the patient take Lasix twice a day first and then reevaluating 2 months to see where things stand. 12/15/2019 upon evaluation today patient appears to be doing regard to his legs although his toes are showing some signs of weeping especially on the left at this point to some degree on the right. There does not appear to be any signs of active infection and overall I do feel like the compression wraps are doing well for him but he has not been able to take the Lasix at home and the increased dose that Dr. [name] recommended. He tells me that just not go to be feasible for him. Nonetheless I think in this case he should probably send a message to Dr. [name] in order to discuss options from the standpoint of possible admission to get the fluid off or otherwise going forward. 12/22/2019 upon evaluation today patient appears to be doing fairly well with regard to his lower extremities at this point. In fact he would be doing excellent if it was not for the fact that his right anterior thigh apparently had an allergic reaction to adhesive tape that he used. The wound itself that we have been monitoring actually appears to be healed. There is a lot of irritation at this point. 12/29/2019 upon evaluation today patient appears to be doing well  in regard to his  lower extremities. His left medial thigh is open and somewhat draining today but this is the only region that is open the right has done much better with the treatment utilizing the steroid cream that I prescribed for him last week. Overall I am pleased in that regard. Fortunately there is no signs of active infection at this time. No fevers, chills, nausea, vomiting, or diarrhea. 01/05/2020 upon evaluation today patient appears to be doing more poorly in regard to his right lower extremity at this point upon evaluation today. Unfortunately he continues to have issues in this regard and I think the biggest issue is controlling his edema. This obviously is not very well controlled at this point is been recommended that he use the Lasix twice a day but he has not been able to do that. Unfortunately I think this is leading to an issue where honestly he is not really able to effectively control his edema and therefore the wounds really are not doing significantly better. I do not think that he is going to be able to keep things under good control unless he is able to control his edema much better. I discussed this again in great detail with him today. 01/12/2020 good news is patient actually appears to be doing quite well today at this point. He does have an appointment with lymphedema clinic tomorrow. His legs appear healed and the toe on the left is almost completely healed. In general I am very pleased with how things stand at this point. 01/19/2020 upon evaluation today patient appears to actually be doing well in regard to his lower extremities there is nothing open at this point. Fortunately he has done extremely well more recently. Has been seeing lymphedema clinic as well. With that being said he has Velcro wraps for his lower legs as well as his upper legs. The only wound really is on his toe which is the right great toe and this is barely anything even there. With all that being said I  think it is good to be appropriate today to go ahead and switch him over to the Velcro compression wraps. 01/26/2020 upon evaluation today patient appears to be doing worse with regard to his lower extremities after last week switch him to Velcro compression wraps. Unfortunately he lasted less than 24 hours he did not have the sock portion of his Velcro wrap on the left leg and subsequently developed a blister underneath the Velcro portion. Obviously this is not good and not what we were looking for at this point. He states the lymphedema clinic did tell him to wear the wrap for 23 hours and take him off for 1 I am okay with that plan but again right now we got a get things back under control again he may have some cellulitis noted as well. 02/02/2020 upon evaluation today patient unfortunately appears to have several areas of blistering on his bilateral lower extremities today mainly on the feet. His legs do seem to be doing somewhat better which is good news. Fortunately there is no evidence of active infection at this time. No fevers, chills, nausea, vomiting, or diarrhea. 02/16/2020 upon evaluation today patient appears to be doing well at this time with regard to his legs. He has a couple weeping areas on his toes but for the most part everything is doing better and does appear to be sealed up on his legs which is excellent news. We can continue with wrapping him at this point as he had every  time we discontinue the wraps he just breaks out with new wounds. There is really no point in is going forward with this at this point. 03/08/2020 upon evaluation today patient actually appears to be doing quite well with regard to his lower extremity ulcers. He has just a very superficial and really almost nonexistent blister on the left lower extremity he has in general done very well with the compression wraps. With that being said I do not see any signs of infection at this time which is good news. 03/29/2020  upon evaluation today patient appears to be doing well with regard to his wounds currently except for where he had several new areas that opened up due to some of the wrap slipping and causing him trouble. He states he did not realize they had slipped. Nonetheless he has a 1 area on the right and 3 new areas on the left. Fortunately there is no signs of active infection at this time which is great news. 04/05/2020 upon evaluation today patient actually appears to be doing quite well in general in regard to his legs currently. Fortunately there is no signs of active infection at this time. No fevers, chills, nausea, vomiting, or diarrhea. He tells me next week that he will actually be seen in the lymphedema clinic on Thursday at 10 AM I see him on Wednesday next week. 04/12/2020 upon evaluation today patient appears to be doing very well with regard to his lower extremities bilaterally. In fact he does not appear to have any open wounds at this point which is good news. Fortunately there is no signs of active infection at this time. No fevers, chills, nausea, vomiting, or diarrhea. 04/19/2020 upon evaluation today patient appears to be doing well with regard to his wounds currently on the bilateral lower extremities. There does not appear to be any signs of active infection at this time. Fortunately there is no evidence of systemic infection and overall very pleased at this point. Nonetheless after I held him out last week he literally had blisters the next morning already which swelled up with him being right back here in the clinic. Overall I think that he is just not can be able to be discharged with his legs the way they are he is much to volume overloaded as far as fluid is concerned and that was discussed with him today of also discussed this but should try the clinic nurse manager as well as Dr. Leanord Hawking. 04/26/2020 upon evaluation today patient appears to be doing better with regard to his wounds  currently. He is making some progress and overall swelling is under good control with the compression wraps. Fortunately there is no evidence of active infection at this time. 05/10/2020 on evaluation today patient appears to be doing overall well in regard to his lower extremities bilaterally. He is Tolerating the compression wraps without complication and with what we are seeing currently I feel like that he is making excellent progress. There is no signs of active infection at this time. 05/24/2020 upon evaluation today patient appears to be doing well in regard to his legs. The swelling is actually quite a bit down compared to where it has been in the past. Fortunately there is no sign of active infection at this time which is also good news. With that being said he does have several wounds on his toes that have opened up at this point. 05/31/2020 upon evaluation today patient appears to be doing well with regard to his  legs bilaterally where he really has no significant fluid buildup at this point overall he seems to be doing quite well. Very pleased in this regard. With regard to his toes these also seem to be drying up which is excellent. We have continue to wrap him as every time we tried as a transition to the juxta light wraps things just do not seem to get any better. 06/07/2020 upon evaluation today patient appears to be doing well with regard to his right leg at this point. Unfortunately left leg has a lot of blistering he tells me the wrap started to slide down on him when he tried to put his other Velcro wrap over top of it to help keep things in order but nonetheless still had some issues. 06/14/2020 on evaluation today patient appears to be doing well with regard to his lower extremity ulcers and foot ulcers at this point. I feel like everything is actually showing signs of improvement which is great news overall there is no signs of active infection at this time. No fevers, chills, nausea,  vomiting, or diarrhea. 06/21/2020 on evaluation today patient actually appears to be doing okay in regard to his wounds in general. With that being said the biggest issue I see is on his right foot in particular the first and second toe seem to be doing a little worse due to the fact this is staying very wet. I think he is probably getting need to change out his dressings a couple times in between each week when we see him in regard to his toes in order to keep this drier based on the location and how this is proceeding. 06/28/2020 on evaluation today patient appears to be doing a little bit more poorly overall in regard to the appearance of the skin I am actually somewhat concerned about the possibility of him having a little bit of an infection here. We discussed the course of potentially giving him a doxycycline prescription which he is taken previously with good result. With that being said I do believe that this is potentially mild and at this point easily fixed. I just do not want anything to get any worse. 07/12/2020 upon evaluation today patient actually appears to be making some progress with regard to his legs which is great news there does not appear to be any evidence of active infection. Overall very pleased with where things stand. 07/26/2020 upon evaluation today patient appears to be doing well with regard to his leg ulcers and toe ulcers at this point. He has been tolerating the compression wraps without complication overall very pleased in this regard. 08/09/2020 upon evaluation today patient appears to be doing well with regard to his lower extremities bilaterally. Fortunately there is no signs of active infection overall I am pleased with where things stand. 08/23/2020 on evaluation today patient appears to be doing well with regard to his wound. He has been tolerating the dressing changes without complication. Fortunately there is no signs of active infection at this time. Overall his  legs seem to be doing quite well which is great news and I am very pleased in that regard. No fevers, chills, nausea, vomiting, or diarrhea. 09/13/2020 upon evaluation today patient appears to be doing okay in regard to his lower extremities. He does have a fairly large blister on the right leg which I did remove the blister tissue from today so we can get this to dry out other than that however he seems to be doing  quite well. There is no signs of active infection at this time. 09/27/2020 upon evaluation today patient appears to actually be doing some better in regard to his right leg. Fortunately signs of active infection at this time which is great news. No fevers, chills, nausea, vomiting, or diarrhea. 10/04/2020 upon evaluation today patient actually appears to be showing signs of improvement which is great news with regard to his leg ulcers. Fortunately there is no signs of active infection which is great news he is still taking the antibiotics currently. No fevers, chills, nausea, vomiting, or diarrhea. 10/18/2020 on evaluation today patient appears to be doing well with regard to his legs currently. He has been tolerating the dressing changes including the wraps without complication. Fortunately there is no signs of active infection at this time. No fevers, chills, nausea, vomiting, or diarrhea. 10/25/2020 upon evaluation today patient appears to be doing decently well in regard to his wounds currently. He has been tolerating the dressing changes without complication. Overall I feel like he is making good progress albeit slow. Again this is something we can have to continue to wrap for some time to come most likely. 11/08/2020 upon evaluation today patient appears to be doing well with regard to his wounds currently. He has been tolerating the dressing changes without complication is not currently on any antibiotics and he does not appear to show any signs of infection. He does continue to have a  lot of drainage on the right leg not too severe but nonetheless this is very scattered. On the left leg this is looking to be much improved overall. 11/15/2020 upon evaluation today patient appears to be doing better with regard to his legs bilaterally. Especially the right leg which was much more significant last week. There does not appear to be any signs of active infection which is great news. No fevers, chills, nausea, vomiting, or diarrhea. 11/23/2019 upon evaluation today patient appears to be doing poorly still in regard to his lower extremities bilaterally. Unfortunately his right leg in particular appears to be doing much more poorly there is no signs really of infection this is not warm to touch but he does have a lot of drainage and weeping unfortunately. With that reason I do believe that we may need to initiate some treatment here to try to help calm down some of the swelling of the right leg. I think switching to a 4-layer compression wrap would be beneficial here. The patient is in agreement with giving this a try. 11/29/2020 upon evaluation today patient appears to be doing well currently in regard to his leg ulcers. I feel like the right leg is doing better he still has a lot of drainage but we do see some improvement here. The 4-layer compression wrap I think was helpful. 12/06/2020 upon evaluation today patient appears to be doing well with regard to his legs. In fact they seem to be doing about the best I have seen up to this point. Fortunately there is no signs of active infection at this time. No fevers, chills, nausea, vomiting, or diarrhea. 12/20/2020 upon evaluation today patient appears to be doing well at this time in regard to his legs. He is not having any significant draining which is great news. Fortunately there is no signs of active infection at this time. No fevers, chills, nausea, vomiting, or diarrhea. 01/17/2021 upon evaluation today Tymarion actually appears to be doing  excellent in regard to his legs. He has a few areas again that  come and go as far as his toes are concerned but overall this is doing quite well. 01/31/2021 upon evaluation today patient appears to be doing well with regard to his legs. Fortunately there does not appear to be any signs of active infection which is great news. Overall he is still having significant edema despite the compression wraps basically the 4-layer compression wrap to just keep things under control there is really not much room for play. 4/13: Mr. Meroney is a longstanding patient in our clinic and benefits greatly from weekly compression wraps. Today he has no complaints. He has been tolerating the wraps well. He states he is using the lymphedema pumps at home. 5/4; patient presents for follow-up of his chronic lymphedema/venous insufficiency ulcers. He comes weekly for compression wraps. He has no complaints today. He was unable to tolerate the Coflex 2 layer Last week so we will do the four press 4-layer compression. He has been using his lymphedema pumps daily. 5/18; patient presents for 2-week follow-up. He has no complaints or issues today. He has developed a new wound to the right foot on his fourth toe. He overall feels well and denies signs of infection. 6/1; patient presents for 2-week follow-up. He has no complaints or issues today. He denies signs of infection. 04/18/2021 upon evaluation today patient appears to be doing well with regard to his legs bilaterally. Family open wound is actually on the toe of his left foot everything else is completely closed which is great news. In general I am extremely pleased with where things stand at this point. The patient is also happy that things are doing so well. 05/02/2021 upon evaluation today patient's legs actually appear to be doing quite well today. Fortunately there does not appear to be any signs of active infection which is great and overall I am extremely pleased with  where he stands today. The patient does not appear to have any evidence of active infection at this time which is also great news. 05/09/2021 upon evaluation today patient appears to be doing a little bit more poorly in regard to his legs. Unfortunately he is having issues with some breakdown and a blood blister on the left leg this is due to I believe honestly to how it was wrapped last week. Fortunately there does not appear to be any signs of infection but nonetheless this is still a concern to be honest. No fevers, chills, nausea, vomiting, or diarrhea. 05/16/2021 upon evaluation today patient appears to be doing significantly better as compared to last week. I am very pleased with where things stand today. There does not appear to be any signs of infection which is great news and overall very pleased with where we stand. No fevers, chills, nausea, vomiting, or diarrhea. 05/30/2021 upon evaluation today patient appears to be doing well with regard to his legs. He has been tolerating the dressing changes without complication. Fortunately there does not appear to be any signs of active infection which is great news and overall I am extremely pleased with where things stand today. No fevers, chills, nausea, vomiting, or diarrhea. 06/20/2021 upon evaluation today patient actually appears to be making good progress today and very pleased with what we are seeing. I think his legs are really maintaining. As long as we continue wrapping he seems to be doing excellent in my opinion. Fortunately there is no signs of active infection at this time. No fevers, chills, nausea, vomiting, or diarrhea. 07/11/2021 upon evaluation today patient actually appears  to be making excellent progress at this time. Fortunately there does not appear to be any evidence of active infection which is great news and overall I am extremely pleased with where things stand today. No fevers, chills, nausea, vomiting, or diarrhea. 07/25/2021  upon evaluation today patient appears to be doing well currently in regard to his lower extremities. He has been making good progress here and I do not see anything that is actually open significantly today this is great news. No fevers, chills, nausea, vomiting, or diarrhea. 08/08/2021 upon evaluation today patient appears to be doing well with regard to his wound. He has been tolerating the dressing changes without complication. With that being said unfortunately has a new area that opened up as far as his right posterior leg is concerned this was a blister he also has an area on the third toe right foot which also reopen. Fortunately there is no signs of active infection at this time which is great news. No fevers, chills, nausea, vomiting, or diarrhea. 10/17; patient came in today at his request initially for a nurse visit because it but out of concern for deterioration in both his lower legs and cellulitis I was asked to look at him. He comes in with increased swelling which he says started over the weekend he started to notice pain as well in his left medial ankle, right knee, left knee left dorsal foot. His wraps fell down contributing to some of this but he has not been using his compression pumps over the weekend for reasons that are not really clear. He comes in with multiple new wounds including the right posterior leg, right third toe, right fourth toe, left second toe left medial ankle left dorsal ankle and right anterior lower leg 09/19/2021 upon evaluation today patient does seem to be making improvements in general which is great news. I do not see any evidence of infection currently he does have some hypergranulation of the anterior portion of his ankle on the left side this is going require some debridement to pare this down and then subsequently silver nitrate probably due to the amount of bleeding that he is probably going to experience. He is in agreement with this plan  however. 09/26/2021 upon evaluation today although the patient's legs appear to be doing okay today unfortunately he did have maggots noted during the evaluation as well. This is again quite unfortunate with the respect to the fact that he feels like that he got the wrap wet which was noted today he is not sure when and I think this is what led to the issue. Nonetheless we can try to see what we can do about silly things off so that this does not happen again in the future although he is can have to be diligent about taking care of his wraps as well. 10/03/2021 upon evaluation today patient appears to be doing okay in regard to his legs currently. He unfortunately has maggots again noted on the left foot. This obviously is becoming quite an issue to be perfectly honest. I do think that he needs to see what can be done at home to try to limit this exposure. Last week we had cleaned everything away so this is a new infestation as far as that is concerned. There is really not much that I can do to combat that I am trying to do what we can here in the clinic to wrap his foot and try to prevent any access of that again  with knots there is a small this becomes very difficult. 10/10/2021 upon evaluation today patient appears to be doing poorly in regard to his legs he is very swollen and unfortunately I think a big part of the issue here is simply that he is not taking his fluid pills appropriately stressed probably is not helping much at all either. He still having a lot of issues as far as getting moved and having to be out of his current living condition. With that being said he does have of note maggots noted on the right foot this time completely separate from what we have been seeing he tells me that he got his wraps wet again. Its mainly when it rains it sounds like that he goes out to his car in the area where he has to park there is a area that collects a fairly large puddle according to what he tells  me today either way I explained to him which we have done before as well that if he gets his wraps wet he needs to let us know so we can get them changed out. He does not need to keep them on wet. 10/17/2021 upon evaluation today patient appears to be doing well in regard to his legs. In fact things are significantly better compared to where they were previous. Fortunately I see no evidence of infection currently which is great news. No fevers, chills, nausea, vomiting, or diarrhea. 10/24/2021 upon evaluation today patient appears to be doing awesome in regard to his legs in general. I think everything is showing signs of significant improvement which is great news. There is really only 1 opening on the second toe left foot everything else is closed. 11/07/2020 upon evaluation today patient appears to be doing well with regard to his legs in general he does have a blister on the left foot but otherwise things are doing overall quite well. 11/28/2021 upon evaluation today patient's legs appear to be doing decently well. The biggest issue we see is that he does have some blistering where the wrap seems to have slid down on the right leg near the top. This is just below the knee. That is good to be the biggest issue going forward at this point in my opinion. Otherwise I really feel like he is doing pretty well across the board with the other wound locations previously noted. 12/05/2021 upon evaluation today patient appears to be doing well with regard to the majority of his wounds in fact he has 1 area healed. Unfortunately he has a new area that broken down over the anterior portion of his left ankle. This is quite deep and draining quite a bit. Fortunately I do not see any signs of infection here though on the lateral portion of his ankle there is some erythema and warmth to touch no drainage nothing really to culture but I am concerned about cellulitis starting up here. 12/12/2021 upon evaluation today  patient appears to be doing significantly better in regard to his leg ulcers. Everything is improved as compared to the last time I saw him. I am actually very pleased with where we stand today. The patient is not having any significant pain which is great news. 12/26/2021 upon evaluation patient appears to be doing a little bit more poorly in regard to the wound on his second toe left foot. Unfortunately with the wrap we put on he collect a lot of fluid underneath the underside of the toe and this has caused this to  breakdown to some degree. Fortunately I do not see any evidence of active infection locally or systemically which is great news. No fevers, chills, nausea, vomiting, or diarrhea. 01/02/2022 upon evaluation today patient appears to be doing well with regard to his wound. He has been tolerating the dressing changes I do feel like the toe as well as the ankle area anteriorly on the left leg is doing much better. I am very pleased in that regard. Electronic Signature(s) Signed: 01/02/2022 11:41:45 AM By: Lenda Kelp PA-C Entered By: Lenda Kelp on 01/02/2022 11:41:45 -------------------------------------------------------------------------------- Physical Exam Details Patient Name: Date of Service: [name], [name] 01/02/2022 10:30 A M Medical Record Number: [id number] Patient Account Number: [id number] Date of Birth/Sex: Treating RN: 01-31-1951 (71 y.o. M) Primary Care Provider: Nicoletta Ba Other Clinician: Referring Provider: Treating Provider/Extender: Adele Dan in Treatment: 310 Constitutional Obese and well-hydrated in no acute distress. Respiratory normal breathing without difficulty. Psychiatric this patient is able to make decisions and demonstrates good insight into disease process. Alert and Oriented x 3. pleasant and cooperative. Notes Patient's wounds in general seem to be showing signs of improvement which is great news and overall  very pleased. Fortunately I do not see any evidence of active infection locally or systemically which is great news. No fevers, chills, nausea, vomiting, or diarrhea. Electronic Signature(s) Signed: 01/02/2022 11:42:05 AM By: Lenda Kelp PA-C Entered By: Lenda Kelp on 01/02/2022 11:42:05 -------------------------------------------------------------------------------- Physician Orders Details Patient Name: Date of Service: [name], [name] 01/02/2022 10:30 A M Medical Record Number: [id number] Patient Account Number: [id number] Date of Birth/Sex: Treating RN: 1951/05/28 (71 y.o. [name], [name] Primary Care Provider: Nicoletta Ba Other Clinician: Referring Provider: Treating Provider/Extender: Adele Dan in Treatment: [phone] Verbal / Phone Orders: No Diagnosis Coding ICD-10 Coding Code Description L97.521 Non-pressure chronic ulcer of other part of left foot limited to breakdown of skin L97.829 Non-pressure chronic ulcer of other part of left lower leg with unspecified severity L97.519 Non-pressure chronic ulcer of other part of right foot with unspecified severity L97.812 Non-pressure chronic ulcer of other part of right lower leg with fat layer exposed I87.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 - Wednesday Algis Liming, RN Room 8 Return A Other: - Patient to follow up with Dr. Marlowe Sax at [facility] regarding weight loss. Bathing/ Shower/ Hygiene May shower with protection but do not get wound dressing(s) wet. Edema Control - Lymphedema / SCD / Other Bilateral Lower Extremities Lymphedema  Pumps. Use Lymphedema pumps on leg(s) 2-3 times a day for 45-60 minutes. If wearing any wraps or hose, do not remove them. Continue exercising as instructed. Elevate legs to the level of the heart or above for 30 minutes daily and/or when sitting, a frequency of: - throughout the day Avoid standing for long periods of time. Exercise regularly Other Edema Control Orders/Instructions: - right leg 4 layer compression with unna boot to secure to upper portion of lower leg. apply lotion to right leg. Wound Treatment Wound #204 - T Second oe Wound Laterality: Left Peri-Wound Care: Zinc Oxide Ointment 30g tube 1 x Per Week/30 Days Discharge Instructions: Apply Zinc Oxide to periwound with each  dressing change Secondary Dressing: Woven Gauze Sponges 2x2 in 1 x Per Week/30 Days Discharge Instructions: Apply over primary dressing as directed. Wound #211 - Ankle Wound Laterality: Left, Anterior Peri-Wound Care: Sween Lotion (Moisturizing lotion) 1 x Per Week/30 Days Discharge Instructions: Apply moisturizing lotion as directed Prim Dressing: KerraCel Ag Gelling Fiber Dressing, 2x2 in (silver alginate) 1 x Per Week/30 Days ary Discharge Instructions: Apply silver alginate to wound bed as instructed Secondary Dressing: Woven Gauze Sponge, Non-Sterile 4x4 in 1 x Per Week/30 Days Discharge Instructions: Apply over primary dressing as directed. Secondary Dressing: ABD Pad, 8x10 1 x Per Week/30 Days Discharge Instructions: Apply over primary dressing as directed. Compression Wrap: FourPress (4 layer compression wrap) 1 x Per Week/30 Days Discharge Instructions: Apply four layer compression . unna layer at top to secure wrap Wound #214 - T Second oe Wound Laterality: Left, Posterior Cleanser: Soap and Water 1 x Per Day/30 Days Discharge Instructions: May shower and wash wound with dial antibacterial soap and water prior to dressing change. Peri-Wound Care: Zinc Oxide Ointment 30g tube 1 x Per Day/30  Days Discharge Instructions: Apply Zinc Oxide to periwound with each dressing change Secondary Dressing: Woven Gauze Sponges 2x2 in 1 x Per Day/30 Days Discharge Instructions: Apply 2x2 gauze rolled over zinc oxide Electronic Signature(s) Signed: 01/02/2022 11:44:47 AM By: Lenda Kelp PA-C Signed: 01/02/2022 4:00:28 PM By: Shawn Stall RN, BSN Entered By: Shawn Stall on 01/02/2022 11:20:07 -------------------------------------------------------------------------------- Problem List Details Patient Name: Date of Service: [name], [name] 01/02/2022 10:30 A M Medical Record Number: [id number] Patient Account Number: [id number] Date of Birth/Sex: Treating RN: 10/03/1951 (71 y.o. 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 04/27/2018 No Yes skin L97.829 Non-pressure chronic ulcer of other part of left lower leg with unspecified 03/21/2021 No Yes severity L97.519 Non-pressure chronic ulcer of other part of right foot with unspecified severity 03/21/2021 No Yes L97.812 Non-pressure chronic ulcer of other part of right lower leg with fat layer 08/08/2021 No Yes exposed I87.333 Chronic venous hypertension (idiopathic) with ulcer and inflammation of 01/22/2016 No Yes bilateral lower extremity L03.116 Cellulitis of left lower limb 08/20/2021 No Yes I89.0 Lymphedema, not elsewhere classified 01/22/2016 No Yes E11.40 Type 2 diabetes mellitus with diabetic neuropathy, unspecified 01/22/2016 No Yes E11.622 Type 2 diabetes mellitus with other skin ulcer 01/22/2016 No Yes L03.115 Cellulitis of right lower limb 12/22/2017 No Yes Inactive Problems ICD-10 Code Description Active Date Inactive Date L97.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 06/30/2018 06/30/2018 L97.511 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 of skin 06/30/2018 06/30/2018 Resolved Problems ICD-10 Code Description Active Date Resolved Date L97.211 Non-pressure chronic ulcer of right calf limited to breakdown of skin 06/30/2018 06/30/2018 L97.221 Non-pressure chronic ulcer of left calf limited to breakdown of skin 09/30/2016 09/30/2016 L03.116 Cellulitis of left lower limb 04/01/2017 04/01/2017 L97.211 Non-pressure chronic ulcer of right calf limited to breakdown of skin 06/30/2017 06/30/2017 Electronic Signature(s) Signed: 01/02/2022 11:10:26 AM By: Lenda Kelp PA-C Entered By: Lenda Kelp on 01/02/2022 11:10:25 -------------------------------------------------------------------------------- Progress Note Details Patient Name: Date of Service: [name], [name] 01/02/2022 10:30 A M Medical Record Number: [id number] Patient Account Number: [id number] Date of Birth/Sex: Treating RN: 13-Jan-1951 (71 y.o. M) Primary  Care Provider: Nicoletta Ba Other Clinician: Referring Provider: Treating Provider/Extender: Adele Dan in Treatment: 310 Subjective Chief Complaint Information obtained from Patient Patient is here for evaluation of venous/lymphedema ulcers History of Present Illness (HPI) Referred by PCP for consultation. Patient has long standing history of BLE venous stasis, no prior ulcerations. At beginning of month, developed cellulitis and weeping. Received IM Rocephin followed by Keflex and resolved. Wears compression stocking, appr 6 months old. Not sure strength. No present drainage. 01/22/16 this is a patient who is a type II diabetic on insulin. 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 He tells me he religiously wears pressure stockings of uncertain strength. He was here with weeping edema about 8 months  ago but did not have an open wound. Roughly a month ago he had a reopening on his bilateral legs. He is been using bandages and Neosporin. He does not complain of pain. 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 Last BUN/creatinine I have is from 11/20/15 at 13 and 1.0 respectively 01/29/16; patient arrives today having tolerated the Profore wrap. He brought in his stockings and these are 18 mmHg stockings he bought from [place]. The compression here is likely inadequate. He does not complain of pain or excessive drainage she has no systemic symptoms. The wound on the right looks improved as does the one on the left although one on the left is more substantial with still tissue at risk below the actual wound area on the bilateral posterior calf 02/05/16; patient arrives with poor edema control. He states that we did put a 4 layer compression on it last week. No weight appear 5 this. 02/12/16; the area on the posterior right Has healed. The left Has a substantial wound that has necrotic surface eschar that requires a debridement with a curette. 02/16/16;the patient called or a Nurse visit secondary to increased swelling. He had been in earlier in the week with his right leg healed. He was transitioned to is on pressure stocking on the right leg with the only open wound on the left, a substantial area on the left posterior calf. Note he has a history of severe lower extremity edema, he has a history of chronic atrial fibrillation but not heart failure per my notes but I'll need to research this. He is not complaining of chest pain shortness of breath or orthopnea. The intake nurse noted blisters on the previously closed right leg 02/19/16; this is the patient's regular visit day. I see him on Friday with escalating edema new wounds on the right leg and clear signs of at least right ventricular heart  failure. I increased his Lasix to 40 twice a day. He is returning currently in follow-up. States he is noticed a decrease in that the edema 02/26/16 patient's legs have much less edema. There is nothing really open on the right leg. The left leg has improved condition of the large superficial wound on the posterior left leg 03/04/16; edema control is very much better. The patient's right leg wounds have healed. On the left leg he continues to have severe venous inflammation on the posterior aspect of the left leg. There is no tenderness and I don't think any of this is cellulitis. 03/11/16; patient's right leg is married healed and he is in his own stocking. The patient's left leg has deteriorated somewhat. There is  a lot of erythema around the wound on the posterior left leg. There is also a significant rim of erythema posteriorly just above where the wrap would've ended there is a new wound in this location and a lot of tenderness. Can't rule out cellulitis in this area. 03/15/16; patient's right leg remains healed and he is in his own stocking. The patient's left leg is much better than last review. His major wound on the posterior aspect of his left Is almost fully epithelialized. He has 3 small injuries from the wraps. Really. Erythema seems a lot better on antibiotics 03/18/16; right leg remains healed and he is in his own stocking. The patient's left leg is much better. The area on the posterior aspect of the left calf is fully epithelialized. His 3 small injuries which were wrap injuries on the left are improved only one seems still open his erythema has resolved 03/25/16; patient's right leg remains healed and he is in his own stocking. There is no open area today on the left leg posterior leg is completely closed up. His wrap injuries at the superior aspect of his leg are also resolved. He looks as though he has some irritation on the dorsal ankle but this is fully epithelialized without evidence of  infection. 03/28/16; we discharged this patient on Monday. Transitioned him into his own stocking. There were problems almost immediately with uncontrolled swelling weeping edema multiple some of which have opened. He does not feel systemically unwell in particular no chest pain no shortness of breath and he does not feel 04/08/16; the edema is under better control with the Profore light wrap but he still has pitting edema. There is one large wound anteriorly 2 on the medial aspect of his left leg and 3 small areas on the superior posterior calf. Drainage is not excessive he is tolerating a Profore light well 04/15/16; put a Profore wrap on him last week. This is controlled is edema however he had a lot of pain on his left anterior foot most of his wounds are healed 04/22/16 once again the patient has denuded areas on the left anterior foot which he states are because his wrap slips up word. He saw his primary physician today is on Lasix 40 twice a day and states that he his weight is down 20 pounds over the last 3 months. 04/29/16: Much improved. left anterior foot much improved. He is now on Lasix 80 mg per day. Much improved edema control 05/06/16; I was hoping to be able to discharge him today however once again he has blisters at a low level of where the compression was placed last week mostly on his left lateral but also his left medial leg and a small area on the anterior part of the left foot. 05/09/16; apparently the patient went home after his appointment on 7/4 later in the evening developing pain in his upper medial thigh together with subjective fever and chills although his temperature was not taken. The pain was so intense he felt he would probably have to call 911. However he then remembered that he had leftover doxycycline from a previous round of antibiotics and took these. By the next morning he felt a lot better. He called and spoke to one of our nurses and I approved doxycycline over the  phone thinking that this was in relation to the wounds we had previously seen although they were definitely were not. The patient feels a lot better old fever no chills he is  still working. Blood sugars are reasonably controlled 05/13/16; patient is back in for review of his cellulitis on his anterior medial upper thigh. He is taking doxycycline this is a lot better. Culture I did of the nodular area on the dorsal aspect of his foot grew MRSA this also looks a lot better. 05/20/16; the patient is cellulitis on the medial upper thigh has resolved. All of his wound areas including the left anterior foot, areas on the medial aspect of the left calf and the lateral aspect of the calf at all resolved. He has a new blister on the left dorsal foot at the level of the fourth toe this was excised. No evidence of infection 05/27/16; patient continues to complain weeping edema. He has new blisterlike wounds on the left anterior lateral and posterior lateral calf at the top of his wrap levels. The area on his left anterior foot appears better. He is not complaining of fever, pain or pruritus in his feet. 05/30/16; the patient's blisters on his left anterior leg posterior calf all look improved. He did not increase the Lasix 100 mg as I suggested because he was going to run out of his 40 mg tablets. He is still having weeping edema of his toes 06/03/16; I renewed his Lasix at 80 mg once a day as he was about to run out when I last saw him. He is on 80 mg of Lasix now. I have asked him to cut down on the excessive amount of water he was drinking and asked him to drink according to his thirst mechanisms 06/12/2016 -- was seen 2 days ago and was supposed to wear his compression stockings at home but he is developed lymphedema and superficial blisters on the left lower extremity and hence came in for a review 06/24/16; the remaining wound is on his left anterior leg. He still has edema coming from between his toes. There is  lymphedema here however his edema is generally better than when I last saw this. He has a history of atrial fibrillation but does not have a known history of congestive heart failure nevertheless I think he probably has this at least on a diastolic basis. 07/01/16 I reviewed his echocardiogram from January 2017. This was essentially normal. He did not have LVH, EF of 55-60%. His right ventricular function was normal although he did have trivial tricuspid and pulmonic regurgitation. This is not audible on exam however. I increased his Lasix to do massive edema in his legs well above his knees I think in early July. He was also drinking an excessive amount of water at the time. 07/15/16; missed his appointment last week because of the Labor Day holiday on Monday. He could not get another appointment later in the week. Started to feel the wrap digging in superiorly so we remove the top half and the bottom half of his wrap. He has extensive erythema and blistering superiorly in the left leg. Very tender. Very swollen. Edema in his foot with leaking edema fluid. He has not been systemically unwell 07/22/16; the area on the left leg laterally required some debridement. The medial wounds look more stable. His wrap injury wounds appear to have healed. Edema and his foot is better, weeping edema is also better. He tells me he is meeting with the supplier of the external compression pumps at work 08/05/16; the patient was on vacation last week in [facility]. His wrap is been on for an extended period of time. Also over the  weekend he developed an extensive area of tender erythema across his anterior medial thigh. He took to doxycycline yesterday that he had leftover from a previous prescription. The patient complains of weeping edema coming out of his toes 08/08/16; I saw this patient on 10/2. He was tender across his anterior thigh. I put him on doxycycline. He returns today in follow-up. He does not have  any open wounds on his lower leg, he still has edema weeping into his toes. 08/12/16; patient was seen back urgently today to follow-up for his extensive left thigh cellulitis/erysipelas. He comes back with a lot less swelling and erythema pain is much better. I believe I gave him Augmentin and Cipro. His wrap was cut down as he stated a roll down his legs. He developed blistering above the level of the wrap that remained. He has 2 open blisters and 1 intact. 08/19/16; patient is been doing his primary doctor who is increased his Lasix from 40-80 once a day or 80 already has less edema. Cellulitis has remained improved in the left thigh. 2 open areas on the posterior left calf 08/26/16; he returns today having new open blisters on the anterior part of his left leg. He has his compression pumps but is not yet been shown how to use some vital representative from the supplier. 09/02/16 patient returns today with no open wounds on the left leg. Some maceration in his plantar toes 09/10/2016 -- Dr. Leanord Hawking had recently discharged him on 09/02/2016 and he has come right back with redness swelling and some open ulcers on his left lower extremity. He says this was caused by trying to apply his compression stockings and he's been unable to use this and has not been able to use his lymphedema pumps. He had some doxycycline leftover and he has started on this a few days ago. 09/16/16; there are no open wounds on his leg on the left and no evidence of cellulitis. 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 He does not complain of symptoms here. I am not clear use using his external compression pumps. 09/23/16; I have not seen this patient in 2 weeks. He canceled his appointment 10 days ago as he was going on vacation. He tells me that on Monday he noticed a large area on his posterior left leg which is been draining copiously and is reopened into a large  wound. He is been using ABDs and the external part of his juxtalite, according to our nurse this was not on properly. 10/07/16; Still a substantial area on the posterior left leg. Using silver alginate 10/14/16; in general better although there is still open area which looks healthy. Still using silver alginate. He reminds me that this happen before he left for [facility] T oday while he was showering in the morning. He had been using his juxtalite's 10/21/16; the area on his posterior left leg is fully epithelialized. However he arrives today with a large area of tender erythema in his medial and posterior left thigh just above the knee. I have marked the area. Once again he is reluctant to consider hospitalization. I treated him with oral antibiotics in the past for a similar situation with resolution I think with doxycycline however this area it seems more extensive to me. He is not complaining of fever but does have chills and says states he is thirsty. His blood sugar today was in the 140s at home 10/25/16 the area  on his posterior left leg is fully epithelialized although there is still some weeping edema. The large area of tenderness and erythema in his medial and posterior left thigh is a lot less tender although there is still a lot of swelling in this thigh. He states he feels a lot better. He is on doxycycline and Augmentin that I started last week. This will continued until Tuesday, December 26. I have ordered a duplex ultrasound of the left thigh rule out DVT whether there is an abscess something that would need to be drained I would also like to know. 11/01/16; he still has weeping edema from a not fully epithelialized area on his left posterior calf. Most of the rest of this looks a lot better. He has completed his antibiotics. His thigh is a lot better. Duplex ultrasound did not show a DVT in the thigh 11/08/16; he comes in today with more Denuded surface epithelium from the posterior  aspect of his calf. There is no real evidence of cellulitis. The superior aspect of his wrap appears to have put quite an indentation in his leg just below the knee and this may have contributed. He does not complain of pain or fever. We have been using silver alginate as the primary dressing. The area of cellulitis in the right thigh has totally resolved. He has been using his compression stockings once a week 11/15/16; the patient arrives today with more loss of epithelium from the posterior aspect of his left calf. He now has a fairly substantial wound in this area. The reason behind this deterioration isn't exactly clear although his edema is not well controlled. He states he feels he is generally more swollen systemically. He is not complaining of chest pain shortness of breath fever. T me he has an appointment with his primary physician in early February. He is on 80 mg of oral ells Lasix a day. He claims compliance with the external compression pumps. He is not having any pain in his legs similar to what he has with his recurrent cellulitis 11/22/16; the patient arrives a follow-up of his large area on his left lateral calf. This looks somewhat better today. He came in earlier in the week for a dressing change since I saw him a week ago. He is not complaining of any pain no shortness of breath no chest pain 11/28/16; the patient arrives for follow-up of his large area on the left lateral calf this does not look better. In fact it is larger weeping edema. The surface of the wound does not look too bad. We have been using silver alginate although I'm not certain that this is a dressing issue. 12/05/16; again the patient follows up for a large wound on the left lateral and left posterior calf this does not look better. There continues to be weeping edema necrotic surface tissue. More worrisome than this once again there is erythema below the wound involving the distal Achilles and heel suggestive of  cellulitis. He is on his feet working most of the day of this is not going well. We are changing his dressing twice a week to facilitate the drainage. 12/12/16; not much change in the overall dimensions of the large area on the left posterior calf. This is very inflamed looking. I gave him an. Doxycycline last week does not really seem to have helped. He found the wrap very painful indeed it seems to of dog into his legs superiorly and perhaps around the heel. He came in early  today because the drainage had soaked through his dressings. 12/19/16- patient arrives for follow-up evaluation of his left lower extremity ulcers. He states that he is using his lymphedema pumps once daily when there is "no drainage". He admits to not using his lipedema pumps while under current treatment. His blood sugars have been consistently between 150-200. 12/26/16; the patient is not using his compression pumps at home because of the wetness on his feet. I've advised him that I think it's important for him to use this daily. He finds his feet too wet, he can put a plastic bag over his legs while he is in the pumps. Otherwise I think will be in a vicious circle. We are using silver alginate to the major area on his left posterior calf 01/02/17; the patient's posterior left leg has further of all into 3 open wounds. All of them covered with a necrotic surface. He claims to be using his compression pumps once a day. His edema control is marginal. Continue with silver alginate 01/10/17; the patient's left posterior leg actually looks somewhat better. There is less edema, less erythema. Still has 3 open areas covered with a necrotic surface requiring debridement. He claims to be using his compression pumps once a day his edema control is better 01/17/17; the patient's left posterior calf look better last week when I saw him and his wrap was changed 2 days ago. He has noted increasing pain in the left heel and arrives today with much  larger wounds extensive erythema extending down into the entire heel area especially tender medially. He is not systemically unwell CBGs have been controlled no fever. Our intake nurse showed me limegreen drainage on his AVD pads. 01/24/17; his usual this patient responds nicely to antibiotics last week giving him Levaquin for presumed Pseudomonas. The whole entire posterior part of his leg is much better much less inflamed and in the case of his Achilles heel area much less tender. He has also had some epithelialization posteriorly there are still open areas here and still draining but overall considerably better 01/31/17- He has continue to tolerate the compression wraps. he states that he continues to use the lymphedema pumps daily, and can increase to twice daily on the weekends. He is voicing no complaints or concerns regarding his LLE ulcers 02/07/17-he is here for follow-up evaluation. He states that he noted some erythema to the left medial and anterior thigh, which he states is new as of yesterday. He is concerned about recurrent cellulitis. He states his blood sugars have been slightly elevated, this morning in the 180s 02/14/17; he is here for follow-up evaluation. When he was last here there was erythema superiorly from his posterior wound in his anterior thigh. He was prescribed Levaquin however a culture of the wound surface grew MRSA over the phone I changed him to doxycycline on Monday and things seem to be a lot better. 02/24/17; patient missed his appointment on Friday therefore we changed his nurse visit into a physician visit today. Still using silver alginate on the large area of the posterior left thigh. He isn't new area on the dorsal left second toe 03/03/17; actually better today although he admits he has not used his external compression pumps in the last 2 days or so because of work responsibilities over the weekend. 03/10/17; continued improvement. External compression pumps once a  day almost all of his wounds have closed on the posterior left calf. Better edema control 03/17/17; in general improved. He still has  3 small open areas on the lateral aspect of his left leg however most of the area on the posterior part of his leg is epithelialized. He has better edema control. He has an ABD pad under his stocking on the right anterior lower leg although he did not let us look at that today. 03/24/17; patient arrives back in clinic today with no open areas however there are areas on the posterior left calf and anterior left calf that are less than 100% epithelialized. His edema is well controlled in the left lower leg. There is some pitting edema probably lymphedema in the left upper thigh. He uses compression pumps at home once per day. I tried to get him to do this twice a day although he is very reticent. 04/01/2017 -- for the last 2 days he's had significant redness, tenderness and weeping and came in for an urgent visit today. 04/07/17; patient still has 6 more days of doxycycline. He was seen by Dr. Meyer RusselBritto last Wednesday for cellulitis involving the posterior aspect, lateral aspect of his Involving his heel. For the most part he is better there is less erythema and less weeping. He has been on his feet for 12 hours o2 over the weekend. Using his compression pumps once a day 04/14/17 arrives today with continued improvement. Only one area on the posterior left calf that is not fully epithelialized. He has intense bilateral venous inflammation associated with his chronic venous insufficiency disease and secondary lymphedema. We have been using silver alginate to the left posterior calf wound In passing he tells us today that the right leg but we have not seen in quite some time has an open area on it but he doesn't want us to look at this today states he will show this to us next week. 04/21/17; there is no open area on his left leg although he still reports some weeping edema. He  showed us his right leg today which is the first time we've seen this leg in a long time. He has a large area of open wound on the right leg anteriorly healthy granulation. Quite a bit of swelling in the right leg and some degree of venous inflammation. He told us about the right leg in passing last week but states that deterioration in the right leg really only happened over the weekend 04/28/17; there is no open area on the left leg although there is an irritated part on the posterior which is like a wrap injury. The wound on the right leg which was new from last week at least to us is a lot better. 05/05/17; still no open area on the left leg. Patient is using his new compression stocking which seems to be doing a good job of controlling the edema. He states he is using his compression pumps once per day. The right leg still has an open wound although it is better in terms of surface area. Required debridement. A lot of pain in the posterior right Achilles marked tenderness. Usually this type of presentation this patient gives concern for an active cellulitis 05/12/17; patient arrives today with his major wound from last week on the right lateral leg somewhat better. Still requiring debridement. He was using his compression stocking on the left leg however that is reopened with superficial wounds anteriorly he did not have an open wound on this leg previously. He is still using his juxta light's once daily at night. He cannot find the time to do this in the  morning as he has to be at work by 7 AM 05/19/17; right lateral leg wound looks improved. No debridement required. The concerning area is on the left posterior leg which appears to almost have a subcutaneous hemorrhagic component to it. We've been using silver alginate to all the wounds 05/26/17; the right lateral leg wound continues to look improved. However the area on the left posterior calf is a tightly adherent surface. [place] using  silver alginate. Because of the weeping edema in his legs there is very little good alternatives. 06/02/17; the patient left here last week looking quite good. Major wound on the left posterior calf and a small one on the right lateral calf. Both of these look satisfactory. He tells me that by Wednesday he had noted increased pain in the left leg and drainage. He called on Thursday and Friday to get an appointment here but we were blocked. He did not go to urgent care or his primary physician. He thinks he had a fever on Thursday but did not actually take his temperature. He has not been using his compression pumps on the left leg because of pain. I advised him to go to the emergency room today for IV antibiotics for stents of left leg cellulitis but he has refused I have asked him to take 2 days off work to keep his leg elevated and he has refused this as well. In view of this I'm going to call him and Augmentin and doxycycline. 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 However after a detailed discussion regarding the risk benefits and alternatives he refuses to get admitted to the hospital. With no other recourse I will continue him on oral antibiotics as before and hopefully he'll have his infectious disease consultation this week. 06/16/2017 -- the patient was seen today by the nurse practitioner at infectious disease Ms. Dixon. 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 He has also been advise Lotrimin cream locally. she also had wise IV antibiotics for his condition if it  flares up 06/23/17; patient arrives today with drainage bilaterally although the remaining wound on the left posterior calf after cleaning up today "highlighter yellow drainage" did not look too bad. Unfortunately he has had breakdown on the right anterior leg [previously this leg had not been open and he is using a black stocking] he went to see infectious disease and is been put on clindamycin 150 mg daily, I did not verify the dose although I'm not familiar with using clindamycin in this dosing range, perhaps for prophylaxisoo 06/27/17; I brought this patient back today to follow-up on the wound deterioration on the right lower leg together with surrounding cellulitis. I started him on doxycycline 4 days ago. This area looks better however he comes in today with intense cellulitis on the medial part of his left thigh. This is not have a wound in this area. Extremely tender. We've been using silver alginate to the wounds on the right lower leg left lower leg with bilateral 4 layer compression he is using his external compression pumps  once a day 07/04/17; patient's left medial thigh cellulitis looks better. He has not been using his compression pumps as his insert said it was contraindicated with cellulitis. His right leg continues to make improvements all the wounds are still open. We only have one remaining wound on the left posterior calf. Using silver alginate to all open areas. He is on doxycycline which I started a week ago and should be finishing I gave him Augmentin after Thursday's visit for the severe cellulitis on the left medial thigh which fortunately looks better 07/14/17; the patient's left medial thigh cellulitis has resolved. The cellulitis in his right lower calf on the right also looks better. All of his wounds are stable to improved we've been using silver alginate he has completed the antibiotics I have given him. He has clindamycin 150 mg once a day prescribed by infectious disease  for prophylaxis, I've advised him to start this now. We have been using bilateral Unna boots over silver alginate to the wound areas 07/21/17; the patient is been to see infectious disease who noted his recurrent problems with cellulitis. He was not able to tolerate prophylactic clindamycin therefore he is on amoxicillin 500 twice a day. He also had a second daily dose of Lasix added By Dr. Oneta Rack but he is not taking this. Nor is he being completely compliant with his compression pumps a especially not this week. He has 2 remaining wounds one on the right posterior lateral lower leg and one on the left posterior medial lower leg. 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 The patient has Unna boots bilaterally. 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 He has bilateral Unna boots for compression and silver alginate to his wounds. Arrives today with his legs looking as good as I have seen him in quite some time. Not surprisingly his wounds look better as well with improvement on the right lateral leg venous insufficiency wound and also the left medial leg. He is still using the compression pumps once a day 08/11/17; both legs appear to be doing better wounds on the right lateral and left medial legs look better. Skin on the right leg quite good. He is been using silver alginate as the primary dressing. I'm going to use Anasept gel calcium alginate and maintain all the secondary dressings 08/18/17; the patient continues to actually do quite well. The area on his right lateral leg is just about closed the left medial also looks better although it is still moist in this area. His edema is well controlled we have been using Anasept gel with calcium alginate and the usual secondary  dressings, 4 layer compression and once daily use of his compression pumps "always been able to manage 09/01/17; the patient continues to do reasonably well in spite of his trip to [place]. The area on the right lateral leg is epithelialized. Left is much better but still open. He has more edema and more chronic erythema on the left leg [venous inflammation] 09/08/17; he arrives today with no open wound on the right lateral leg and decently controlled edema. Unfortunately his left leg is not nearly as in his good situation as last week.he apparently had increasing edema starting on Saturday. He edema soaked through into his foot so used a plastic bag  to walk around his home. The area on the medial right leg which was his open area is about the same however he has lost surface epithelium on the left lateral which is new and he has significant pain in the Achilles area of the left foot. He is already on amoxicillin chronically for prophylaxis of cellulitis in the left leg 09/15/17; he is completed a week of doxycycline and the cellulitis in the left posterior leg and Achilles area is as usual improved. He still has a lot of edema and fluid soaking through his dressings. There is no open wound on the right leg. He saw infectious disease NP today 09/22/17;As usual 1 we transition him from our compression wraps to his stockings things did not go well. He has several small open areas on the right leg. He states this was caused by the compression wrap on his skin although he did not wear this with the stockings over them. He has several superficial areas on the left leg medially laterally posteriorly. He does not have any evidence of active cellulitis especially involving the left Achilles The patient is traveling from [facility] Saturday going to [facility]. He states he isn't attempting to get an appointment with a heel objects wound center there to change his dressings. I am not completely certain  whether this will work 10/06/17; the patient came in on Friday for a nurse visit and the nurse reported that his legs actually look quite good. He arrives in clinic today for his regular follow-up visit. He has a new wound on his left third toe over the PIP probably caused by friction with his footwear. He has small areas on the left leg and a very superficial but epithelialized area on the right anterior lateral lower leg. Other than that his legs look as good as I've seen him in quite some time. We have been using silver alginate Review of systems; no chest pain no shortness of breath other than this a 10 point review of systems negative 10/20/17; seen by Dr. Meyer Russel last week. He had taken some antibiotics [doxycycline] that he had left over. Dr. Meyer Russel thought he had candida infection and declined to give him further antibiotics. He has a small wound remaining on the right lateral leg several areas on the left leg including a larger area on the left posterior several left medial and anterior and a small wound on the left lateral. The area on the left dorsal third toe looks a lot better. ROS; Gen.; no fever, respiratory no cough no sputum Cardiac no chest pain other than this 10 point review of system is negative 10/30/17; patient arrives today having fallen in the bathtub 3 days ago. It took him a while to get up. He has pain and maceration in the wounds on his left leg which have deteriorated. He has not been using his pumps he also has some maceration on the right lateral leg. 11/03/17; patient continues to have weeping edema especially in the left leg. This saturates his dressings which were just put on on 12/27. As usual the doxycycline seems to take care of the cellulitis on his lower leg. He is not complaining of fever, chills, or other systemic symptoms. He states his leg feels a lot better on the doxycycline I gave him empirically. He also apparently gets injections at his primary doctor's  officeo Rocephin for cellulitis prophylaxis. I didn't ask him about his compression pump compliance today I think that's probably marginal. Arrives in the  clinic with all of his dressings primary and secondary macerated full of fluid and he has bilateral edema 11/10/17; the patient's right leg looks some better although there is still a cluster of wounds on the right lateral. The left leg is inflamed with almost circumferential skin loss medially to laterally although we are still maintaining anteriorly. He does not have overt cellulitis there is a lot of drainage. He is not using compression pumps. We have been using silver alginate to the wound areas, there are not a lot of options here 11/17/17; the patient's right leg continues to be stable although there is still open wounds, better than last week. The inflammation in the left leg is better. Still loss of surface layer epithelium especially posteriorly. There is no overt cellulitis in the amount of edema and his left leg is really quite good, tells me he is using his compression pumps once a day. 11/24/17; patient's right leg has a small superficial wound laterally this continues to improve. The inflammation in the left leg is still improving however we have continuous surface layer epithelial loss posteriorly. There is no overt cellulitis in the amount of edema in both legs is really quite good. He states he is using his compression pumps on the left leg once a day for 5 out of 7 days 12/01/17; very small superficial areas on the right lateral leg continue to improve. Edema control in both legs is better today. He has continued loss of surface epithelialization and left posterior calf although I think this is better. We have been using silver alginate with large number of absorptive secondary dressings 4 layer on the left Unna boot on the right at his request. He tells me he is using his compression pumps once a day 12/08/17; he has no open area on the  right leg is edema control is good here. ooOn the left leg however he has marked erythema and tenderness breakdown of skin. He has what appears to be a wrap injury just distal to the popliteal fossa. 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 There was swelling erythema and breakdown in the right leg. He has much worse skin breakdown in the left leg as well multiple open areas medially and posteriorly as well as laterally. He tells me he has been using his compression pumps but tells me he feels that the drainage out of his leg is worse when he uses a compression pumps. T be fair to him he is been saying this o for a while however I don't know that I have really been listening to this. I wonder if the compression pumps are working properly 12/22/17;. Once again he arrives with severe erythema, weeping edema from the left greater than right leg. Noncompliance with compression pumps. New this visit he is complaining of pain on the lateral aspect of the right leg and the medial aspect of his right thigh. He apparently saw his cardiologist Dr. [name] who was ordered an echocardiogram area and I think this is a step in the right direction 12/25/17; started his doxycycline Monday night. There is still intense erythema of the right leg especially in the anterior thigh although there is less tenderness. The erythema around the wound on  the right lateral calf also is less tender. He still complaining of pain in the left heel. His wounds are about the same right lateral left medial left lateral. Superficial but certainly not close to closure. He denies being systemically unwell no fever chills no abdominal pain no diarrhea 12/29/17; back  in follow-up of his extensive right calf and right thigh cellulitis. I added amoxicillin to cover possible doxycycline resistant strep. This seems to of done the trick he is in much less pain there is much less erythema and swelling. He has his echocardiogram at 11:00 this morning. X-ray of the left heel was also negative. 01/05/18; the patient arrived with his edema under much better control. Now that he is retired he is able to use his compression pumps daily and sometimes twice a day per the patient. He has a wound on the right leg the lateral wound looks better. Area on the left leg also looks a lot better. He has no evidence of cellulitis in his bilateral thighs I had a quick peak at his echocardiogram. He is in normal ejection fraction and normal left ventricular function. He has moderate pulmonary hypertension moderately reduced right ventricular function. One would have to wonder about chronic sleep apnea although he says he doesn't snore. He'll review the echocardiogram with his cardiologist. 01/12/18; the patient arrives with the edema in both legs under exemplary control. He is using his compression pumps daily and sometimes twice daily. His wound on the right lateral leg is just about closed. He still has some weeping areas on the posterior left calf and lateral left calf although everything is just about closed here as well. I have spoken with Aldean Baker who is the patient's nurse practitioner and infectious disease. She was concerned that the patient had not understood that the parenteral penicillin injections he was receiving for cellulitis prophylaxis was actually benefiting him. I don't think the patient actually saw that I would tend to agree we were certainly dealing with less infections although he had a serious one last month. 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 He is healed. He'll be placed in juxtalite compression wraps and increase  his lymphedema pumps to twice daily. We will follow up again next week to ensure there are no issues with the new regiment. 01/20/18-he is here for evaluation of bilateral lower extremity weeping edema. Yesterday he was placed in compression wrap to the right lower extremity and compression stocking to left lower shrubbery. He states he uses lymphedema pumps last night and again this morning and noted a blister to the left lower extremity. On exam he was noted to have drainage to the right lower extremity. He will be placed in Unna boots bilaterally and follow-up next week 01/26/18; patient was actually discharged a week ago to his own juxta light stockings only to return the next day with bilateral lower extremity weeping edema.he was placed in bilateral Unna boots. He arrives today with pain in the back of his left leg. There is no open area on the right leg however there is a linear/wrap injury on the left leg and weeping edema on the left leg posteriorly. I spoke with infectious disease about 10 days ago. They were disappointed that the patient elected to discontinue prophylactic intramuscular penicillin shots as they felt it was particularly beneficial in reducing the frequency of his cellulitis. I discussed this with the patient today. He does not share this view. He'll definitely need antibiotics today.  Finally he is traveling to [place] and trauma leaving this Saturday and returning a week later and he does not travel with his pumps. He is going by car 01/30/18; patient was seen 4 days ago and brought back in today for review of cellulitis in the left leg posteriorly. I put him on amoxicillin this really hasn't helped as much as I might like. He is also worried because he is traveling to [facility] by car. Finally we will be rewrapping him. There is no open area on the right leg over his left leg has multiple weeping areas as usual 02/09/18; The same wrap on for 10 days. He did not pick up  the last doxycycline I prescribed for him. He apparently took 4 days worth he already had. There is nothing open on his right leg and the edema control is really quite good. He's had damage in the left leg medially and laterally especially probably related to the prolonged use of Unna boots 02/12/18; the patient arrived in clinic today for a nurse visit/wrap change. He complained of a lot of pain in the left posterior calf. He is taking doxycycline that I previously prescribed for him. Unfortunately even though he used his stockings and apparently used to compression pumps twice a day he has weeping edema coming out of the lateral part of his right leg. This is coming from the lower anterior lateral skin area. 02/16/18; the patient has finished his doxycycline and will finish the amoxicillin 2 days. The area of cellulitis in the left calf posteriorly has resolved. He is no longer having any pain. He tells me he is using his compression pumps at least once a day sometimes twice. 02/23/18; the patient finished his doxycycline and Amoxil last week. On Friday he noticed a small erythematous circle about the size of a quarter on the left lower leg just above his ankle. This rapidly expanded and he now has erythema on the lateral and posterior part of the thigh. This is bright red. Also has an area on the dorsal foot just above his toes and a tender area just below the left popliteal fossa. He came off his prophylactic penicillin injections at his own insistence one or 2 months ago. This is obviously deteriorated since then 03/02/18; patient is on doxycycline and Amoxil. Culture I did last week of the weeping area on the back of his left calf grew group B strep. I have therefore renewed the amoxicillin 500 3 times a day for a further week. He has not been systemically unwell. Still complaining of an area of discomfort right under his left popliteal fossa. There is no open wound on the right leg. He tells me that  he is using his pumps twice a day on most days 03/09/18; patient arrives in clinic today completing his amoxicillin today. The cellulitis on his left leg is better. Furthermore he tells me that he had intramuscular penicillin shots that his primary care office today. However he also states that the wrap on his right leg fell down shortly after leaving clinic last week. He developed a large blister that was present when he came in for a nurse visit later in the week and then he developed intense discomfort around this area.He tells me he is using his compression pumps 03/16/18; the patient has completed his doxycycline. The infectious part of this/cellulitis in the left heel area left popliteal area is a lot better. He has 2 open areas on the right calf. Still areas  on the left calf but this is a lot better as well. 03/24/18; the patient arrives complaining of pain in the left popliteal area again. He thinks some of this is wrap injury. He has no open area on the right leg and really no open area on the left calf either except for the popliteal area. He claims to be compliant with the compression pumps 03/31/18; I gave him doxycycline last week because of cellulitis in the left popliteal area. This is a lot better although the surface epithelium is denuded off and response to this. He arrives today with uncontrolled edema in the right calf area as well as a fingernail injury in the right lateral calf. There is only a few open areas on the left 04/06/18; I gave him amoxicillin doxycycline over the last 2 weeks that the amoxicillin should be completing currently. He is not complaining of any pain or systemic symptoms. The only open areas see has is on the right lateral lower leg paradoxically I cannot see anything on the left lower leg. He tells me he is using his compression pumps twice a day on most days. Silver alginate to the wounds that are open under 4 layer compression 04/13/18; he completed antibiotics  and has no new complaints. Using his compression pumps. Silver alginate that anything that's opened 04/20/18; he is using his compression pumps religiously. Silver alginate 4 layer compression anything that's opened. He comes in today with no open wounds on the left leg but 3 on the right including a new one posteriorly. He has 2 on the right lateral and one on the right posterior. He likes Unna boots on the right leg for reasons that aren't really clear we had the usual 4 layer compression on the left. It may be necessary to move to the 4 layer compression on the right however for now I left them in the Unna boots 04/27/18; he is using his compression pumps at least once a day. He has still the wounds on the right lateral calf. The area right posteriorly has closed. He does not have an open wound on the left under 4 layer compression however on the dorsal left foot just proximal to the toes and the left third toe 2 small open areas were identified 05/11/18; he has not uses compression pumps. The areas on the right lateral calf have coalesced into one large wound necrotic surface. On the left side he has one small wound anteriorly however the edema is now weeping out of a large part of his left leg. He says he wasn't using his pumps because of the weeping fluid. I explained to him that this is the time he needs to pump more 05/18/18; patient states he is using his compression pumps twice a day. The area on the right lateral large wound albeit superficial. On the left side he has innumerable number of small new wounds on the left calf particularly laterally but several anteriorly and medially. All these appear to have healthy granulated base these look like the remnants of blisters however they occurred under compression. The patient arrives in clinic today with his legs somewhat better. There is certainly less edema, less multiple open areas on the left calf and the right anterior leg looks somewhat better  as well superficial and a little smaller. However he relates pain and erythema over the last 3-4 days in the thigh and I looked at this today. He has not been systemically unwell no fever no chills no change  in blood sugar values 05/25/18; comes in today in a better state. The severe cellulitis on his left leg seems better with the Keflex. Not as tender. He has not been systemically unwell ooHard to find an open wound on the left lower leg using his compression pumps twice a day ooThe confluent wounds on his right lateral calf somewhat better looking. These will ultimately need debridement I didn't do this today. 06/01/18; the severe cellulitis on the left anterior thigh has resolved and he is completed his Keflex. ooThere is no open wound on the left leg however there is a superficial excoriation at the base of the third toe dorsally. Skin on the bottom of his left foot is macerated looking. ooThe left the wounds on the lateral right leg actually looks some better although he did require debridement of the top half of this wound area with an open curet 06/09/18 on evaluation today patient appears to be doing poorly in regard to his right lower extremity in particular this appears to likely be infected he has very thick purulent discharge along with a bright green tent to the discharge. This makes me concerned about the possibility of pseudomonas. He's also having increased discomfort at this point on evaluation. Fortunately there does not appear to be any evidence of infection spreading to the other location at this time. 06/16/18 on evaluation today patient appears to actually be doing fairly well. His ulcer has actually diminished in size quite significantly at this point which is good news. Nonetheless he still does have some evidence of infection he did see infectious disease this morning before coming here for his appointment. I did review the results of their evaluation and their note today. They  did actually have him discontinue the Cipro and initiate treatment with linezolid at this time. He is doing this for the next seven days and they recommended a follow-up in four months with them. He is the keep a log of the need for intermittent antibiotic therapy between now and when he falls back up with infectious disease. This will help them gaze what exactly they need to do to try and help them out. 06/23/18; the patient arrives today with no open wounds on the left leg and left third toe healed. He is been using his compression pumps twice a day. On the right lateral leg he still has a sizable wound but this is a lot better than last time I saw this. 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 Has been using silver alginate under 4 layer compression 06/30/18; the only open wound he has is on the right lateral leg and this looks healthy. No debridement is required. We have been using silver alginate. He does not have an open wound on the left leg. There is apparently some drainage from the dorsal proximal third toe on the left although I see no open wound here. 07/03/18 on evaluation today patient was actually here just for a nurse visit rapid change. However when he was here on Wednesday for his rat change due to having been healed on the left and then developing blisters we initiated the wrap again knowing that he would be back today for [place] to reevaluate and see were at. Unfortunately he has developed some cellulitis into the proximal portion of his right lower extremity even into the region of his thigh. He did test positive for MRSA  on the last culture which was reported back on 06/23/18. He was placed on one as what at that point. Nonetheless he is done with that and has been tolerating it well otherwise. Doxycycline which in the past really did not seem to be effective for him. Nonetheless I  think the best option may be for [place] to definitely reinitiate the antibiotics for a longer period of time. 07/07/18; since I last saw this patient a week ago he has had a difficult time. At that point he did not have an open wound on his left leg. We transitioned him into juxta light stockings. He was apparently in the clinic the next day with blisters on the left lateral and left medial lower calf. He also had weeping edema fluid. He was put back into a compression wrap. He was also in the clinic on Friday with intense erythema in his right thigh. Per the patient he was started on Bactrim however that didn't work at all in terms of relieving his pain and swelling. He has taken 3 doxycycline that he had left over from last time and that seems to of helped. He has blistering on the right thigh as well. 07/14/18; the erythema on his right thigh has gotten better with doxycycline that he is finishing. 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 Presumably this cellulitis in the thigh was not related to that although I think this is a bit concerning going forward. He still has an area on the right lateral calf the blister on the right medial calf just below the knee that was discussed above. On the left 2 small open areas left medial and left lateral. Edema control is adequate. He is using his compression pumps twice a day 07/20/18; continued improvement in the condition of both legs especially the edema in his bilateral thighs. He tells me he is been losing weight through a combination of diet and exercise. He is using his compression pumps twice a day. So overall she made to the remaining wounds 07/27/2018; continued improvement in condition of both legs. His edema is well controlled. The area on the right lateral leg is just about closed he had one blisters show up on the medial left upper calf. We have him in 4 layer compression. He is  going on a 10-day trip to [place] and [place]. He will be driving. He wants to wear Unna boots because of the lessening amount of constriction. He will not use compression pumps while he is away 08/05/18 on evaluation today patient actually appears to be doing decently well all things considered in regard to his bilateral lower extremities. The worst ulcer is actually only posterior aspect of his left lower extremity with a four layer compression wrap cut into his leg a couple weeks back. He did have a trip and actually had [employer] for the trip that he is worn since he was last here. Nonetheless he feels like the [employer] actually do better for him his swelling is up a little bit but he also with his trip was not taking his Lasix on a regular set schedule like he was supposed to be. He states that obviously the reason being that he cannot drive and keep going without having to urinate too frequently which makes it difficult. He did not have his pumps with him while he was away either which I  think also maybe playing a role here too. 08/13/2018; the patient only has a small open wound on the right lateral calf which is a big improvement in the last month or 2. He also has the area posteriorly just below the posterior fossa on the left which I think was a wrap injury from several weeks ago. He has no current evidence of cellulitis. He tells me he is back into his compression pumps twice a day. He also tells me that while he was at the laundromat somebody stole a section of his extremitease stockings 08/20/2018; back in the clinic with a much improved state. He only has small areas on the right lateral mid calf which is just about healed. This was is more substantial area for quite a prolonged period of time. He has a small open area on the left anterior tibia. The area on the posterior calf just below the popliteal fossa is closed today. He is using his compression pumps  twice a day 08/28/2018; patient has no open wound on the right leg. He has a smattering of open areas on the calf with some weeping lymphedema. More problematically than that it looks as though his wraps of slipped down in his usual he has very angry upper area of edema just below the right medial knee and on the right lateral calf. He has no open area on his feet. The patient is traveling to [facility] next week. I will send him in an antibiotic. We will continue to wrap the right leg. We ordered extremitease stockings for him last week and I plan to transition the right leg to a stocking when he gets home which will be in 10 days time. As usual he is very reluctant to take his pumps with him when he travels 09/07/2018; patient returns from [facility]. He shows me a picture of his left leg in the mid part of his trip last week with intense fire engine erythema. The picture look bad enough I would have considered sending him to the hospital. Instead he went to the wound care center in [facility]. They did not prescribe him antibiotics but he did take some doxycycline he had leftover from a previous visit. I had given him trimethoprim sulfamethoxazole before he left this did not work according to the patient. This is resulted in some improvement fortunately. He comes back with a large wound on the left posterior calf. Smaller area on the left anterior tibia. Denuded blisters on the dorsal left foot over his toes. Does not have much in the way of wounds on the right leg although he does have a very tender area on the right posterior area just below the popliteal fossa also suggestive of infection. He promises me he is back on his pumps twice a day 09/15/2018; the intense cellulitis in his left lower calf is a lot better. The wound area on the posterior left calf is also so better. However he has reasonably extensive wounds on the dorsal aspect of his second and third toes and the proximal  foot just at the base of the toes. There is nothing open on the right leg 09/22/2018; the patient has excellent edema control in his legs bilaterally. He is using his external compression pumps twice a day. He has no open area on the right leg and only the areas in the left foot dorsally second and third toe area on the left side. He does not have any signs of active cellulitis.  10/06/2018; the patient has good edema control bilaterally. He has no open wound on the right leg. There is a blister in the posterior aspect of his left calf that we had to deal with today. He is using his compression pumps twice a day. There is no signs of active cellulitis. We have been using silver alginate to the wound areas. He still has vulnerable areas on the base of his left first second toes dorsally He has a his extremities stockings and we are going to transition him today into the stocking on the right leg. He is cautioned that he will need to continue to use the compression pumps twice a day. If he notices uncontrolled edema in the right leg he may need to go to 3 times a day. 10/13/2018; the patient came in for a nurse check on Friday he has a large flaccid blister on the right medial calf just below the knee. We unroofed this. He has this and a new area underneath the posterior mid calf which was undoubtedly a blister as well. He also has several small areas on the right which is the area we put his extremities stocking on. 10/19/2018; the patient went to see infectious disease this morning I am not sure if that was a routine follow-up in any case the doxycycline I had given him was discontinued and started on linezolid. He has not started this. It is easy to look at his left calf and the inflammation and think this is cellulitis however he is very tender in the tissue just below the popliteal fossa and I have no doubt that there is infection going on here. He states the problem he is having is that with the  compression pumps the edema goes down and then starts walking the wrap falls down. We will see if we can adhere this. He has 1 or 2 minuscule open areas on the right still areas that are weeping on the posterior left calf, the base of his left second and third toes 10/26/18; back today in clinic with quite of skin breakdown in his left anterior leg. This may have been infection the area below the popliteal fossa seems a lot better however tremendous epithelial loss on the left anterior mid tibia area over quite inexpensive tissue. He has 2 blisters on the right side but no other open wound here. 10/29/2018; came in urgently to see [place] today and we worked him in for review. He states that the 4 layer compression on the right leg caused pain he had to cut it down to roughly his mid calf this caused swelling above the wrap and he has blisters and skin breakdown today. As a result of the pain he has not been using his pumps. Both legs are a lot more edematous and there is a lot of weeping fluid. 11/02/18; arrives in clinic with continued difficulties in the right leg> left. Leg is swollen and painful. multiple skin blisters and new open areas especially laterally. He has not been using his pumps on the right leg. 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 He is not systemically unwell. 11/09/2018; the patient claims he is being compliant with his pumps. He is finished the doxycycline I gave him last week. Culture I did of the wound on the right lateral leg showed a few very resistant methicillin staph aureus. This was resistant to doxycycline. Nevertheless he states the pain in the leg is a lot better  which makes me wonder if the cultured organism was not really what was causing the problem nevertheless this is a very dangerous organism to be culturing out of any wound. His right leg is still a lot larger than the left. He is using an Radio broadcast assistant on this area, he  blames a 4-layer compression for causing the original skin breakdown which I doubt is true however I cannot talk him out of it. We have been using silver alginate to all of these areas which were initially blisters 11/16/2018; patient is being compliant with his external compression pumps at twice a day. Miraculously he arrives in clinic today with absolutely no open wounds. He has better edema control on the left where he has been using 4 layer compression versus wound of wounds on the right and I pointed this out to him. There is no inflammation in the skin in his lower legs which is also somewhat unusual for him. There is no open wounds on the dorsal left foot. He has extremitease stockings at home and I have asked him to bring these in next week. 11/25/18 patient's lower extremity on examination today on the left appears for the most part to be wound free. He does have an open wound on the lateral aspect of the right lower extremity but this is minimal compared to what I've seen in past. He does request that we go ahead and wrap the left leg as well even though there's nothing open just so hopefully it will not reopen in short order. 1/28; patient has superficial open wounds on the right lateral calf left anterior calf and left posterior calf. His edema control is adequate. 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 We have been using silver alginate as the primary dressing. He claims compliance with his compression pumps 2/4; patient has superficial open wounds on numerous areas of his left calf and again one on the left dorsal foot. The areas on the right lateral calf have healed. The cellulitis that I gave him doxycycline for last week is also resolved this was mostly on the left anterior calf just below the tibial tuberosity. His edema looks fairly well-controlled. He tells me he went to see his primary doctor  today and had blood work ordered 2/11; once again he has several open areas on the left calf left tibial area. Most of these are small and appear to have healthy granulation. He does not have anything open on the right. The edema and control in his thighs is pretty good which is usually a good indication he has been using his pumps as requested. 2/18; he continues to have several small areas on the left calf and left tibial area. Most of these are small healthy granulation. We put him in his stocking on the right leg last week and he arrives with a superficial open area over the right upper tibia and a fairly large area on the right lateral tibia in similar condition. His edema control actually does not look too bad, he claims to be using his compression pumps twice a day 2/25. Continued small areas on the left calf and left tibial area. New areas especially on the right are identified just below the tibial tuberosity and on the right upper tibia itself. There are also areas of weeping edema fluid even without an obvious wound. He does not have a considerable degree of lymphedema but clearly  there is more edema here than his skin can handle. He states he is using the pumps twice a day. We have an Unna boot on the right and 4 layer compression on the left. 3/3; he continues to have an area on the right lateral calf and right posterior calf just below the popliteal fossa. There is a fair amount of tenderness around the wound on the popliteal fossa but I did not see any evidence of cellulitis, could just be that the wrap came down and rubbed in this area. ooHe does not have an open area on the left leg however there is an area on the left dorsal foot at the base of the third toe ooWe have been using silver alginate to all wound areas 3/10; he did not have an open area on his left leg last time he was here a week ago. T oday he arrives with a horizontal wound just below the tibial tuberosity and an area  on the left lateral calf. He has intense erythema and tenderness in this area. The area is on the right lateral calf and right posterior calf better than last week. We have been using silver alginate as usual 3/18 - Patient returns with 3 small open areas on left calf, and 1 small open area on right calf, the skin looks ok with no significant erythema, he continues the UNA boot on right and 4 layer compression on left. The right lateral calf wound is closed , the right posterior is small area. we will continue silver alginate to the areas. 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 He is been tolerating the dressing changes without complication. Fortunately she has made excellent improvement in regard to the overall status of his wounds. Unfortunately every time we cease wrapping him he ends up reopening in causing more significant issues at that point. Again I'm unsure of the best direction to take although I think the lymphedema clinic may be appropriate for him. 02/03/19 on evaluation today patient appears to be doing well in regard to the wounds that we saw him for last week unfortunately he has a new area on the proximal portion of his right medial/posterior lower extremity where the wrap somewhat slowed down and caused swelling and a blister to rub and open. Unfortunately this is the only opening that he has on either leg at this point. 02/17/19 on evaluation today patient's bilateral lower extremities appear to be doing well. He still completely healed in regard to the left lower extremity. In regard to the right lower extremity the area where the wrap and slid down and caused the blister still seems to be slightly open although this is dramatically better than during the last evaluation two weeks ago.  I'm very pleased with the way this stands overall. 03/03/19 on evaluation today patient appears to be doing well in regard to his right lower extremity in general although he did have a new blister open this does not appear to be showing any evidence of active infection at this time. Fortunately there's No fevers, chills, nausea, or vomiting noted at this time. Overall I feel like he is making good progress it does feel like that the right leg will we perform the [employer] seems to do with a bit better than three layer wrap on  the left which slid down on him. We may switch to doing bilateral in the book wraps. 5/4; I have not seen Mr. Toutant in quite some time. According to our case manager he did not have an open wound on his left leg last week. He had 1 remaining wound on the right posterior medial calf. He arrives today with multiple openings on the left leg probably were blisters and/or wrap injuries from Unna boots. I do not think the Unna boot's will provide adequate compression on the left. I am also not clear about the frequency he is using the compression pumps. 03/17/19 on evaluation today patient appears to be doing excellent in regard to his lower extremities compared to last week's evaluation apparently. He had gotten significantly worse last week which is unfortunate. The [employer] wrap on the left did not seem to do very well for him at all and in fact it didn't control his swelling significantly enough he had an additional outbreak. Subsequently we go back to the four layer compression wrap on the left. This is good news. At least in that he is doing better and the wound seem to be killing him. He still has not heard anything from the lymphedema clinic. 03/24/19 on evaluation today patient actually appears to be doing much better in regard to his bilateral lower Trinity as compared to last week when I saw him. Fortunately there's no signs of active infection at this time. He has been  tolerating the dressing changes without complication. Overall I'm extremely pleased with the progress and appearance in general. 04/07/19 on evaluation today patient appears to be doing well in regard to his bilateral lower extremities. His swelling is significantly down from where it was previous. With that being said he does have a couple blisters still open at this point but fortunately nothing that seems to be too severe and again the majority of the larger openings has healed at this time. 04/14/19 on evaluation today patient actually appears to be doing quite well in regard to his bilateral lower extremities in fact I'm not even sure there's anything significantly open at this time at any site. Nonetheless he did have some trouble with these wraps where they are somewhat irritating him secondary to the fact that he has noted that the graph wasn't too close down to the end of this foot in a little bit short as well up to his knee. Otherwise things seem to be doing quite well. 04/21/19 upon evaluation today patient's wound bed actually showed evidence of being completely healed in regard to both lower extremities which is excellent news. There does not appear to be any signs of active infection which is also good news. I'm very pleased in this regard. No fevers, chills, nausea, or vomiting noted at this time. 04/28/19 on evaluation today patient appears to be doing a little bit worse in regard to both lower extremities on the left mainly due to the fact that when he went infection disease the wrap was not wrapped quite high enough he developed a blister above this. On the right he is a small open area of nothing too significant but again this is continuing to give him some trouble he has been were in the Velcro compression that he has at home. 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 He's been tolerating the dressing changes  without complication. Fortunately there's no signs of active infection at this time.  No fevers, chills, nausea, or vomiting noted at this time. We have been trying to get an appointment with her lymphedema clinic in [facility] but unfortunately nobody can get them on phone with not been able to even fax information over the patient likewise is not been able to get in touch with them. Overall I'm not sure exactly what's going on here with to reach out again today. 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 Still having a lot of drainage unfortunately. He tells me especially in the left but even on the right. There's no signs of active infection which is good news we've been using so ratcheted up to this point. 05/19/19 on evaluation today patient actually appears to be doing quite well with regard to his left lower extremity which is great news. Fortunately in regard to the right lower extremity has an issues with his wrap and he subsequently did remove this from what I'm understanding. Nonetheless long story short is what he had rewrapped once he removed it subsequently had maggots underneath this wrap whenever he came in for evaluation today. With that being said they were obviously completely cleaned away by the nursing staff. The visit today which is excellent news. However he does appear to potentially have some infection around the right ankle region where the maggots were located as well. He will likely require anabiotic therapy today. 05/26/19 on evaluation today patient actually appears to be doing much better in regard to his bilateral lower extremities. I feel like the infection is under much better control. With that being said there were maggots noted when the wrap was removed yet again today. Again this could have potentially been left over from previous although at this time there does not  appear to be any signs of significant drainage there was obviously on the wrap some drainage as well this contracted gnats or otherwise. Either way I do not see anything that appears to be doing worse in my pinion and in fact I think his drainage has slowed down quite significantly likely mainly due to the fact to his infection being under better control. 06/02/2019 on evaluation today patient actually appears to be doing well with regard to his bilateral lower extremities there is no signs of active infection at this time which is great news. With that being said he does have several open areas more so on the right than the left but nonetheless these are all significantly better than previously noted. 06/09/2019 on evaluation today patient actually appears to be doing well. His wrap stayed up and he did not cause any problems he had more drainage on the right compared to the left but overall I do not see any major issues at this time which is great news. 06/16/2019 on evaluation today patient appears to be doing excellent with regard to his lower extremities the only area that is open is a new blister that can have opened as of today on the medial ankle on the left. Other than this he really seems to be doing great I see no major issues at this point. 06/23/2019 on evaluation today patient appears to be doing quite well with regard to his bilateral lower extremities. In fact he actually appears to be almost completely healed there is a small area of weeping noted of the right lower extremity just above the ankle. Nonetheless fortunately there is no signs of active infection  at this time which is good news. No fevers, chills, nausea, vomiting, or diarrhea. 8/24; the patient arrived for a nurse visit today but complained of very significant pain in the left leg and therefore I was asked to look at this. Noted that he did not have an open area on the left leg last week nevertheless this was wrapped. The  patient states that he is not been able to put his compression pumps on the left leg because of the discomfort. He has not been systemically unwell 06/30/2019 on evaluation today patient unfortunately despite being excellent last week is doing much worse with regard to his left lower extremity today. In fact he had to come in for a nurse on Monday where his left leg had to be rewrapped due to excessive weeping Dr. Leanord Hawking placed him on doxycycline at that point. Fortunately there is no signs of active infection Systemically at this time which is good news. 07/07/2019 in regard to the patient's wounds today he actually seems to be doing well with his right lower extremity there really is nothing open or draining at this point this is great news. Unfortunately the left lower extremity is given him additional trouble at this time. There does not appear to be any signs of active infection nonetheless he does have a lot of edema and swelling noted at this point as well as blistering all of which has led to a much more poor appearing leg at this time compared to where it was 2 weeks ago when it was almost completely healed. Obviously this is a little discouraging for the patient. He is try to contact the lymphedema clinic in [place] he has not been able to get through to them. 07/14/2019 on evaluation today patient actually appears to be doing slightly better with regard to his left lower extremity ulcers. Overall I do feel like at least at the top of the wrap that we have been placing this area has healed quite nicely and looks much better. The remainder of the leg is showing signs of improvement. Unfortunately in the thigh area he still has an open region on the left and again on the right he has been utilizing just a Band-Aid on an area that also opened on the thigh. Again this is an area that were not able to wrap although we did do an Ace wrap to provide some compression that something that obviously is  a little less effective than the compression wraps we have been using on the lower portion of the leg. He does have an appointment with the lymphedema clinic in [facility] on Friday. 07/21/2019 on evaluation today patient appears to be doing better with regard to his lower extremity ulcers. He has been tolerating the dressing changes without complication. Fortunately there is no signs of active infection at this time. No fevers, chills, nausea, vomiting, or diarrhea. I did receive the paperwork from the physical therapist at the lymphedema clinic in [place]. Subsequently I signed off on that this morning and sent that back to him for further progression with the treatment plan. 07/28/2019 on evaluation today patient appears to be doing very well with regard to his right lower extremity where I do not see any open wounds at this point. Fortunately he is feeling great as far as that is concerned as well. In regard to the left lower extremity he has been having issues with still several areas of weeping and edema although the upper leg is doing better his lower  leg still I think is going require the compression wrap at this time. No fevers, chills, nausea, vomiting, or diarrhea. 08/04/2019 on evaluation today patient unfortunately is having new wounds on the right lower extremity. Again we have been using Unna boot wrap on that side. We switched him to using his juxta lite wrap at home. With that being said he tells me he has been using it although his legs extremely swollen and to be honest really does not appear that he has been. I cannot know that for sure however. Nonetheless he has multiple new wounds on the right lower extremity at this time. Obviously we will have to see about getting this rewrapped for him today. 08/11/2019 on evaluation today patient appears to be doing fairly well with regard to his wounds. He has been tolerating the dressing changes including the compression wraps without  complication. He still has a lot of edema in his upper thigh regions bilaterally he is supposed to be seeing the lymphedema clinic on the 15th of this month once his wraps arrive for the upper part of his legs. 08/18/2019 on evaluation today patient appears to be doing well with regard to his bilateral lower extremities at this point. He has been tolerating the dressing changes without complication. Fortunately there is no signs of active infection which is also good news. He does have a couple weeping areas on the first and second toe of the right foot he also has just a small area on the left foot upper leg and a small area on the left lower leg but overall he is doing quite well in my opinion. He is supposed to be getting his wraps shortly in fact tomorrow and then subsequently is seeing the lymphedema clinic next Wednesday on the 21st. Of note he is also leaving on the 25th to go on vacation for a week to the beach. For that reason and since there is some uncertainty about what there can be doing at lymphedema clinic next Wednesday I am get a make an appointment for next Friday here for [place] to see what we need to do for him prior to him leaving for vacation. 10/23; patient arrives in considerable pain predominantly in the upper posterior calf just distal to the popliteal fossa also in the wound anteriorly above the major wound. This is probably cellulitis and he has had this recurrently in the past. He has no open wound on the right side and he has had an Radio broadcast assistant in that area. Finally I note that he has an area on the left posterior calf which by enlarge is mostly epithelialized. This protrudes beyond the borders of the surrounding skin in the setting of dry scaly skin and lymphedema. The patient is leaving for [facility] on Sunday. Per his longstanding pattern, he will not take his compression pumps with him predominantly out of fear that they will be stolen. He therefore asked that we put a  Unna boot back on the right leg. He will also contact the wound care center in [facility] to see if they can change his dressing in the mid week. 11/3; patient returned from his vacation to [facility]. He was seen on 1 occasion at their wound care center. They did a 2 layer compression system as they did not have our 4-layer wrap. I am not completely certain what they put on the wounds. They did not change the Unna boot on the right. The patient is also seeing a lymphedema  specialist physical therapist in [place]. It appears that he has some compression sleeve for his thighs which indeed look quite a bit better than I am used to seeing. He pumps over these with his external compression pumps. 11/10; the patient has a new wound on the right medial thigh otherwise there is no open areas on the right. He has an area on the left leg posteriorly anteriorly and medially and an area over the left second toe. We have been using silver alginate. He thinks the injury on his thigh is secondary to friction from the compression sleeve he has. 11/17; the patient has a new wound on the right medial thigh last week. He thinks this is because he did not have a underlying stocking for his thigh juxta lite apparatus. He now has this. The area is fairly large and somewhat angry but I do not think he has underlying cellulitis. ooHe has a intact blister on the right anterior tibial area. ooSmall wound on the right great toe dorsally ooSmall area on the medial left calf. 11/30; the patient does not have any open areas on his right leg and we did not take his juxta lite stocking off. However he states that on Friday his compression wrap fell down lodging around his upper mid calf area. As usual this creates a lot of problems for him. He called urgently today to be seen for a nurse visit however the nurse visit turned into a provider visit because of extreme erythema and pain in the left anterior tibia extending  laterally and posteriorly. The area that is problematic is extensive 10/06/2019 upon evaluation today patient actually appears to be doing poorly in regard to his left lower extremity. He Dr. Leanord Hawking did place him on doxycycline this past Monday apparently due to the fact that he was doing much worse in regard to this left leg. Fortunately the doxycycline does seem to be helping. Unfortunately we are still having a very difficult time getting his edema under any type of control in order to anticipate discharge at some point. The only way were really able to control his lymphedema really is with compression wraps and that has only even seemingly temporary. He has been seeing a lymphedema clinic they are trying to help in this regard but still this has been somewhat frustrating in general for the patient. 10/13/19 on evaluation today patient appears to be doing excellent with regard to his right lower extremity as far as the wounds are concerned. His swelling is still quite extensive unfortunately. He is still having a lot of drainage from the thigh areas bilaterally which is unfortunate. He's been going to lymphedema clinic but again he still really does not have this edema under control as far as his lower extremities are concern. With regard to his left lower extremity this seems to be improving and I do believe the doxycycline has been of benefit for him. He is about to complete the doxycycline. 10/20/2019 on evaluation today patient appears to be doing poorly in regard to his bilateral lower extremities. More in the right thigh he has a lot of irritation at this site unfortunately. In regard to the left lower extremity the wrap was not quite as high it appears and does seem to have caused him some trouble as well. Fortunately there is no evidence of systemic infection though he does have some blue-green drainage which has me concerned for the possibility of Pseudomonas. He tells me he is previously  taking Cipro without  complications and he really does not care for Levaquin however due to some of the side effects he has. He is not allergic to any medications specifically antibiotics that were aware of. 10/27/2019 on evaluation today patient actually does appear to be for the most part doing better when compared to last week's evaluation. With that being said he still has multiple open wounds over the bilateral lower extremities. He actually forgot to start taking the Cipro and states that he still has the whole bottle. He does have several new blisters on left lower extremity today I think I would recommend he go ahead and take the Cipro based on what I am seeing at this point. 12/30-Patient comes at 1 week visit, 4 layer compression wraps on the left and Unna boot on the right, primary dressing Xtrasorb and silver alginate. Patient is taking his Cipro and has a few more days left probably 5-6, and the legs are doing better. He states he is using his compressions devices which I believe he has 11/10/2019 on evaluation today patient actually appears to be much better than last time I saw him 2 weeks ago. His wounds are significantly improved and overall I am very pleased in this regard. Fortunately there is no signs of active infection at this time. He is just a couple of days away from completing Cipro. Overall his edema is much better he has been using his lymphedema pumps which I think is also helping at this point. 11/17/2019 on evaluation today patient appears to be doing excellent in regard to his wounds in general. His legs are swollen but not nearly as much as they have been in the past. Fortunately he is tolerating the compression wraps without complication. No fevers, chills, nausea, vomiting, or diarrhea. He does have some erythema however in the distal portion of his right lower extremity specifically around the forefoot and toes there is a little bit of warmth here as well. 11/24/2019 on  evaluation today patient appears to be doing well with regard to his right lower extremity I really do not see any open wounds at this point. His left lower extremity does have several open areas and his right medial thigh also is open. Other than this however overall the patient seems to be making good progress and I am very pleased at this point. 12/01/2019 on evaluation today patient appears to be doing poorly at this point in regard to his left lower extremity has several new blisters despite the fact that we have him in compression wraps. In fact he had a 4-layer compression wrap, his upper thigh wrapped from lymphedema clinic, and a juxta light over top of the 4 layer compression wrap the lymphedema clinic applied and despite all this he still develop blisters underneath. Obviously this does have me concerned about the fact that unfortunately despite what we are doing to try to get wounds healed he continues to have new areas arise I do not think he is ever good to be at the point where he can realistically just use wraps at home to keep things under control. Typically when we heal him it takes about 1-2 days before he is back in the clinic with severe breakdown and blistering of his lower extremities bilaterally. This is happened numerous times in the past. Unfortunately I think that we may need some help as far as overall fluid overload to kind of limit what we are seeing and get things under better control. 12/08/2019 on evaluation today patient  presents for follow-up concerning his ongoing bilateral lower extremity edema. Unfortunately he is still having quite a bit of swelling the compression wraps are controlling this to some degree but he did see Dr. [name] his cardiologist I do have that available for review today as far as the appointment was concerned that was on 12/06/2019. Obviously that she has been 2 days ago. The patient states that he is only been taking the Lasix 80 mg 1 time a day he  had told me previously he was taking this twice a day. Nonetheless Dr. [name] recommended this be up to 80 mg 2 times a day for the patient as he did appear to be fluid overloaded. With that being said the patient states he did this yesterday and he was unable to go anywhere or do anything due to the fact that he was constantly having to urinate. Nonetheless I think that this is still good to be something that is important for him as far as trying to get his edema under control at all things that he is going to be able to just expect his wounds to get under control and things to be better without going through at least a period of time where he is trying to stabilize his fluid management in general and I think increasing the Lasix is likely the first step here. It was also mentioned the possibility that the patient may require metolazone. With that being said he wanted to have the patient take Lasix twice a day first and then reevaluating 2 months to see where things stand. 12/15/2019 upon evaluation today patient appears to be doing regard to his legs although his toes are showing some signs of weeping especially on the left at this point to some degree on the right. There does not appear to be any signs of active infection and overall I do feel like the compression wraps are doing well for him but he has not been able to take the Lasix at home and the increased dose that Dr. [name] recommended. He tells me that just not go to be feasible for him. Nonetheless I think in this case he should probably send a message to Dr. [name] in order to discuss options from the standpoint of possible admission to get the fluid off or otherwise going forward. 12/22/2019 upon evaluation today patient appears to be doing fairly well with regard to his lower extremities at this point. In fact he would be doing excellent if it was not for the fact that his right anterior thigh apparently had an allergic reaction to adhesive  tape that he used. The wound itself that we have been monitoring actually appears to be healed. There is a lot of irritation at this point. 12/29/2019 upon evaluation today patient appears to be doing well in regard to his lower extremities. His left medial thigh is open and somewhat draining today but this is the only region that is open the right has done much better with the treatment utilizing the steroid cream that I prescribed for him last week. Overall I am pleased in that regard. Fortunately there is no signs of active infection at this time. No fevers, chills, nausea, vomiting, or diarrhea. 01/05/2020 upon evaluation today patient appears to be doing more poorly in regard to his right lower extremity at this point upon evaluation today. Unfortunately he continues to have issues in this regard and I think the biggest issue is controlling his edema. This obviously is not very well  controlled at this point is been recommended that he use the Lasix twice a day but he has not been able to do that. Unfortunately I think this is leading to an issue where honestly he is not really able to effectively control his edema and therefore the wounds really are not doing significantly better. I do not think that he is going to be able to keep things under good control unless he is able to control his edema much better. I discussed this again in great detail with him today. 01/12/2020 good news is patient actually appears to be doing quite well today at this point. He does have an appointment with lymphedema clinic tomorrow. His legs appear healed and the toe on the left is almost completely healed. In general I am very pleased with how things stand at this point. 01/19/2020 upon evaluation today patient appears to actually be doing well in regard to his lower extremities there is nothing open at this point. Fortunately he has done extremely well more recently. Has been seeing lymphedema clinic as well. With that  being said he has Velcro wraps for his lower legs as well as his upper legs. The only wound really is on his toe which is the right great toe and this is barely anything even there. With all that being said I think it is good to be appropriate today to go ahead and switch him over to the Velcro compression wraps. 01/26/2020 upon evaluation today patient appears to be doing worse with regard to his lower extremities after last week switch him to Velcro compression wraps. Unfortunately he lasted less than 24 hours he did not have the sock portion of his Velcro wrap on the left leg and subsequently developed a blister underneath the Velcro portion. Obviously this is not good and not what we were looking for at this point. He states the lymphedema clinic did tell him to wear the wrap for 23 hours and take him off for 1 I am okay with that plan but again right now we got a get things back under control again he may have some cellulitis noted as well. 02/02/2020 upon evaluation today patient unfortunately appears to have several areas of blistering on his bilateral lower extremities today mainly on the feet. His legs do seem to be doing somewhat better which is good news. Fortunately there is no evidence of active infection at this time. No fevers, chills, nausea, vomiting, or diarrhea. 02/16/2020 upon evaluation today patient appears to be doing well at this time with regard to his legs. He has a couple weeping areas on his toes but for the most part everything is doing better and does appear to be sealed up on his legs which is excellent news. We can continue with wrapping him at this point as he had every time we discontinue the wraps he just breaks out with new wounds. There is really no point in is going forward with this at this point. 03/08/2020 upon evaluation today patient actually appears to be doing quite well with regard to his lower extremity ulcers. He has just a very superficial and really almost  nonexistent blister on the left lower extremity he has in general done very well with the compression wraps. With that being said I do not see any signs of infection at this time which is good news. 03/29/2020 upon evaluation today patient appears to be doing well with regard to his wounds currently except for where he had several new  areas that opened up due to some of the wrap slipping and causing him trouble. He states he did not realize they had slipped. Nonetheless he has a 1 area on the right and 3 new areas on the left. Fortunately there is no signs of active infection at this time which is great news. 04/05/2020 upon evaluation today patient actually appears to be doing quite well in general in regard to his legs currently. Fortunately there is no signs of active infection at this time. No fevers, chills, nausea, vomiting, or diarrhea. He tells me next week that he will actually be seen in the lymphedema clinic on Thursday at 10 AM I see him on Wednesday next week. 04/12/2020 upon evaluation today patient appears to be doing very well with regard to his lower extremities bilaterally. In fact he does not appear to have any open wounds at this point which is good news. Fortunately there is no signs of active infection at this time. No fevers, chills, nausea, vomiting, or diarrhea. 04/19/2020 upon evaluation today patient appears to be doing well with regard to his wounds currently on the bilateral lower extremities. There does not appear to be any signs of active infection at this time. Fortunately there is no evidence of systemic infection and overall very pleased at this point. Nonetheless after I held him out last week he literally had blisters the next morning already which swelled up with him being right back here in the clinic. Overall I think that he is just not can be able to be discharged with his legs the way they are he is much to volume overloaded as far as fluid is concerned and that was  discussed with him today of also discussed this but should try the clinic nurse manager as well as Dr. Leanord Hawking. 04/26/2020 upon evaluation today patient appears to be doing better with regard to his wounds currently. He is making some progress and overall swelling is under good control with the compression wraps. Fortunately there is no evidence of active infection at this time. 05/10/2020 on evaluation today patient appears to be doing overall well in regard to his lower extremities bilaterally. He is Tolerating the compression wraps without complication and with what we are seeing currently I feel like that he is making excellent progress. There is no signs of active infection at this time. 05/24/2020 upon evaluation today patient appears to be doing well in regard to his legs. The swelling is actually quite a bit down compared to where it has been in the past. Fortunately there is no sign of active infection at this time which is also good news. With that being said he does have several wounds on his toes that have opened up at this point. 05/31/2020 upon evaluation today patient appears to be doing well with regard to his legs bilaterally where he really has no significant fluid buildup at this point overall he seems to be doing quite well. Very pleased in this regard. With regard to his toes these also seem to be drying up which is excellent. We have continue to wrap him as every time we tried as a transition to the juxta light wraps things just do not seem to get any better. 06/07/2020 upon evaluation today patient appears to be doing well with regard to his right leg at this point. Unfortunately left leg has a lot of blistering he tells me the wrap started to slide down on him when he tried to put his other Velcro  wrap over top of it to help keep things in order but nonetheless still had some issues. 06/14/2020 on evaluation today patient appears to be doing well with regard to his lower extremity ulcers  and foot ulcers at this point. I feel like everything is actually showing signs of improvement which is great news overall there is no signs of active infection at this time. No fevers, chills, nausea, vomiting, or diarrhea. 06/21/2020 on evaluation today patient actually appears to be doing okay in regard to his wounds in general. With that being said the biggest issue I see is on his right foot in particular the first and second toe seem to be doing a little worse due to the fact this is staying very wet. I think he is probably getting need to change out his dressings a couple times in between each week when we see him in regard to his toes in order to keep this drier based on the location and how this is proceeding. 06/28/2020 on evaluation today patient appears to be doing a little bit more poorly overall in regard to the appearance of the skin I am actually somewhat concerned about the possibility of him having a little bit of an infection here. We discussed the course of potentially giving him a doxycycline prescription which he is taken previously with good result. With that being said I do believe that this is potentially mild and at this point easily fixed. I just do not want anything to get any worse. 07/12/2020 upon evaluation today patient actually appears to be making some progress with regard to his legs which is great news there does not appear to be any evidence of active infection. Overall very pleased with where things stand. 07/26/2020 upon evaluation today patient appears to be doing well with regard to his leg ulcers and toe ulcers at this point. He has been tolerating the compression wraps without complication overall very pleased in this regard. 08/09/2020 upon evaluation today patient appears to be doing well with regard to his lower extremities bilaterally. Fortunately there is no signs of active infection overall I am pleased with where things stand. 08/23/2020 on evaluation  today patient appears to be doing well with regard to his wound. He has been tolerating the dressing changes without complication. Fortunately there is no signs of active infection at this time. Overall his legs seem to be doing quite well which is great news and I am very pleased in that regard. No fevers, chills, nausea, vomiting, or diarrhea. 09/13/2020 upon evaluation today patient appears to be doing okay in regard to his lower extremities. He does have a fairly large blister on the right leg which I did remove the blister tissue from today so we can get this to dry out other than that however he seems to be doing quite well. There is no signs of active infection at this time. 09/27/2020 upon evaluation today patient appears to actually be doing some better in regard to his right leg. Fortunately signs of active infection at this time which is great news. No fevers, chills, nausea, vomiting, or diarrhea. 10/04/2020 upon evaluation today patient actually appears to be showing signs of improvement which is great news with regard to his leg ulcers. Fortunately there is no signs of active infection which is great news he is still taking the antibiotics currently. No fevers, chills, nausea, vomiting, or diarrhea. 10/18/2020 on evaluation today patient appears to be doing well with regard to his legs currently. He  has been tolerating the dressing changes including the wraps without complication. Fortunately there is no signs of active infection at this time. No fevers, chills, nausea, vomiting, or diarrhea. 10/25/2020 upon evaluation today patient appears to be doing decently well in regard to his wounds currently. He has been tolerating the dressing changes without complication. Overall I feel like he is making good progress albeit slow. Again this is something we can have to continue to wrap for some time to come most likely. 11/08/2020 upon evaluation today patient appears to be doing well with regard  to his wounds currently. He has been tolerating the dressing changes without complication is not currently on any antibiotics and he does not appear to show any signs of infection. He does continue to have a lot of drainage on the right leg not too severe but nonetheless this is very scattered. On the left leg this is looking to be much improved overall. 11/15/2020 upon evaluation today patient appears to be doing better with regard to his legs bilaterally. Especially the right leg which was much more significant last week. There does not appear to be any signs of active infection which is great news. No fevers, chills, nausea, vomiting, or diarrhea. 11/23/2019 upon evaluation today patient appears to be doing poorly still in regard to his lower extremities bilaterally. Unfortunately his right leg in particular appears to be doing much more poorly there is no signs really of infection this is not warm to touch but he does have a lot of drainage and weeping unfortunately. With that reason I do believe that we may need to initiate some treatment here to try to help calm down some of the swelling of the right leg. I think switching to a 4-layer compression wrap would be beneficial here. The patient is in agreement with giving this a try. 11/29/2020 upon evaluation today patient appears to be doing well currently in regard to his leg ulcers. I feel like the right leg is doing better he still has a lot of drainage but we do see some improvement here. The 4-layer compression wrap I think was helpful. 12/06/2020 upon evaluation today patient appears to be doing well with regard to his legs. In fact they seem to be doing about the best I have seen up to this point. Fortunately there is no signs of active infection at this time. No fevers, chills, nausea, vomiting, or diarrhea. 12/20/2020 upon evaluation today patient appears to be doing well at this time in regard to his legs. He is not having any significant  draining which is great news. Fortunately there is no signs of active infection at this time. No fevers, chills, nausea, vomiting, or diarrhea. 01/17/2021 upon evaluation today Ruxin actually appears to be doing excellent in regard to his legs. He has a few areas again that come and go as far as his toes are concerned but overall this is doing quite well. 01/31/2021 upon evaluation today patient appears to be doing well with regard to his legs. Fortunately there does not appear to be any signs of active infection which is great news. Overall he is still having significant edema despite the compression wraps basically the 4-layer compression wrap to just keep things under control there is really not much room for play. 4/13: Mr. Schoolfield is a longstanding patient in our clinic and benefits greatly from weekly compression wraps. Today he has no complaints. He has been tolerating the wraps well. He states he is using the lymphedema pumps  at home. 5/4; patient presents for follow-up of his chronic lymphedema/venous insufficiency ulcers. He comes weekly for compression wraps. He has no complaints today. He was unable to tolerate the Coflex 2 layer Last week so we will do the four press 4-layer compression. He has been using his lymphedema pumps daily. 5/18; patient presents for 2-week follow-up. He has no complaints or issues today. He has developed a new wound to the right foot on his fourth toe. He overall feels well and denies signs of infection. 6/1; patient presents for 2-week follow-up. He has no complaints or issues today. He denies signs of infection. 04/18/2021 upon evaluation today patient appears to be doing well with regard to his legs bilaterally. Family open wound is actually on the toe of his left foot everything else is completely closed which is great news. In general I am extremely pleased with where things stand at this point. The patient is also happy that things are doing so  well. 05/02/2021 upon evaluation today patient's legs actually appear to be doing quite well today. Fortunately there does not appear to be any signs of active infection which is great and overall I am extremely pleased with where he stands today. The patient does not appear to have any evidence of active infection at this time which is also great news. 05/09/2021 upon evaluation today patient appears to be doing a little bit more poorly in regard to his legs. Unfortunately he is having issues with some breakdown and a blood blister on the left leg this is due to I believe honestly to how it was wrapped last week. Fortunately there does not appear to be any signs of infection but nonetheless this is still a concern to be honest. No fevers, chills, nausea, vomiting, or diarrhea. 05/16/2021 upon evaluation today patient appears to be doing significantly better as compared to last week. I am very pleased with where things stand today. There does not appear to be any signs of infection which is great news and overall very pleased with where we stand. No fevers, chills, nausea, vomiting, or diarrhea. 05/30/2021 upon evaluation today patient appears to be doing well with regard to his legs. He has been tolerating the dressing changes without complication. Fortunately there does not appear to be any signs of active infection which is great news and overall I am extremely pleased with where things stand today. No fevers, chills, nausea, vomiting, or diarrhea. 06/20/2021 upon evaluation today patient actually appears to be making good progress today and very pleased with what we are seeing. I think his legs are really maintaining. As long as we continue wrapping he seems to be doing excellent in my opinion. Fortunately there is no signs of active infection at this time. No fevers, chills, nausea, vomiting, or diarrhea. 07/11/2021 upon evaluation today patient actually appears to be making excellent progress at this  time. Fortunately there does not appear to be any evidence of active infection which is great news and overall I am extremely pleased with where things stand today. No fevers, chills, nausea, vomiting, or diarrhea. 07/25/2021 upon evaluation today patient appears to be doing well currently in regard to his lower extremities. He has been making good progress here and I do not see anything that is actually open significantly today this is great news. No fevers, chills, nausea, vomiting, or diarrhea. 08/08/2021 upon evaluation today patient appears to be doing well with regard to his wound. He has been tolerating the dressing changes without complication. With  that being said unfortunately has a new area that opened up as far as his right posterior leg is concerned this was a blister he also has an area on the third toe right foot which also reopen. Fortunately there is no signs of active infection at this time which is great news. No fevers, chills, nausea, vomiting, or diarrhea. 10/17; patient came in today at his request initially for a nurse visit because it but out of concern for deterioration in both his lower legs and cellulitis I was asked to look at him. He comes in with increased swelling which he says started over the weekend he started to notice pain as well in his left medial ankle, right knee, left knee left dorsal foot. His wraps fell down contributing to some of this but he has not been using his compression pumps over the weekend for reasons that are not really clear. He comes in with multiple new wounds including the right posterior leg, right third toe, right fourth toe, left second toe left medial ankle left dorsal ankle and right anterior lower leg 09/19/2021 upon evaluation today patient does seem to be making improvements in general which is great news. I do not see any evidence of infection currently he does have some hypergranulation of the anterior portion of his ankle on the left  side this is going require some debridement to pare this down and then subsequently silver nitrate probably due to the amount of bleeding that he is probably going to experience. He is in agreement with this plan however. 09/26/2021 upon evaluation today although the patient's legs appear to be doing okay today unfortunately he did have maggots noted during the evaluation as well. This is again quite unfortunate with the respect to the fact that he feels like that he got the wrap wet which was noted today he is not sure when and I think this is what led to the issue. Nonetheless we can try to see what we can do about silly things off so that this does not happen again in the future although he is can have to be diligent about taking care of his wraps as well. 10/03/2021 upon evaluation today patient appears to be doing okay in regard to his legs currently. He unfortunately has maggots again noted on the left foot. This obviously is becoming quite an issue to be perfectly honest. I do think that he needs to see what can be done at home to try to limit this exposure. Last week we had cleaned everything away so this is a new infestation as far as that is concerned. There is really not much that I can do to combat that I am trying to do what we can here in the clinic to wrap his foot and try to prevent any access of that again with knots there is a small this becomes very difficult. 10/10/2021 upon evaluation today patient appears to be doing poorly in regard to his legs he is very swollen and unfortunately I think a big part of the issue here is simply that he is not taking his fluid pills appropriately stressed probably is not helping much at all either. He still having a lot of issues as far as getting moved and having to be out of his current living condition. With that being said he does have of note maggots noted on the right foot this time completely separate from what we have been seeing he tells me  that he got his  wraps wet again. Its mainly when it rains it sounds like that he goes out to his car in the area where he has to park there is a area that collects a fairly large puddle according to what he tells me today either way I explained to him which we have done before as well that if he gets his wraps wet he needs to let us know so we can get them changed out. He does not need to keep them on wet. 10/17/2021 upon evaluation today patient appears to be doing well in regard to his legs. In fact things are significantly better compared to where they were previous. Fortunately I see no evidence of infection currently which is great news. No fevers, chills, nausea, vomiting, or diarrhea. 10/24/2021 upon evaluation today patient appears to be doing awesome in regard to his legs in general. I think everything is showing signs of significant improvement which is great news. There is really only 1 opening on the second toe left foot everything else is closed. 11/07/2020 upon evaluation today patient appears to be doing well with regard to his legs in general he does have a blister on the left foot but otherwise things are doing overall quite well. 11/28/2021 upon evaluation today patient's legs appear to be doing decently well. The biggest issue we see is that he does have some blistering where the wrap seems to have slid down on the right leg near the top. This is just below the knee. That is good to be the biggest issue going forward at this point in my opinion. Otherwise I really feel like he is doing pretty well across the board with the other wound locations previously noted. 12/05/2021 upon evaluation today patient appears to be doing well with regard to the majority of his wounds in fact he has 1 area healed. Unfortunately he has a new area that broken down over the anterior portion of his left ankle. This is quite deep and draining quite a bit. Fortunately I do not see any signs of infection here  though on the lateral portion of his ankle there is some erythema and warmth to touch no drainage nothing really to culture but I am concerned about cellulitis starting up here. 12/12/2021 upon evaluation today patient appears to be doing significantly better in regard to his leg ulcers. Everything is improved as compared to the last time I saw him. I am actually very pleased with where we stand today. The patient is not having any significant pain which is great news. 12/26/2021 upon evaluation patient appears to be doing a little bit more poorly in regard to the wound on his second toe left foot. Unfortunately with the wrap we put on he collect a lot of fluid underneath the underside of the toe and this has caused this to breakdown to some degree. Fortunately I do not see any evidence of active infection locally or systemically which is great news. No fevers, chills, nausea, vomiting, or diarrhea. 01/02/2022 upon evaluation today patient appears to be doing well with regard to his wound. He has been tolerating the dressing changes I do feel like the toe as well as the ankle area anteriorly on the left leg is doing much better. I am very pleased in that regard. Objective Constitutional Obese and well-hydrated in no acute distress. Vitals Time Taken: 10:41 AM, Height: 70 in, Weight: 380.2 lbs, BMI: 54.5, Temperature: 98.4 F, Pulse: 74 bpm, Respiratory Rate: 17 breaths/min, Blood Pressure: 124/84 mmHg, Capillary  Blood Glucose: 174 mg/dl. Respiratory normal breathing without difficulty. Psychiatric this patient is able to make decisions and demonstrates good insight into disease process. Alert and Oriented x 3. pleasant and cooperative. General Notes: Patient's wounds in general seem to be showing signs of improvement which is great news and overall very pleased. Fortunately I do not see any evidence of active infection locally or systemically which is great news. No fevers, chills, nausea, vomiting,  or diarrhea. Integumentary (Hair, Skin) Wound #204 status is Open. Original cause of wound was Gradually Appeared. The date acquired was: 09/26/2021. The wound has been in treatment 14 weeks. The wound is located on the Left T Second. The wound measures 2cm length x 2.5cm width x 0.1cm depth; 3.927cm^2 area and 0.393cm^3 volume. There is oe Fat Layer (Subcutaneous Tissue) exposed. There is no tunneling or undermining noted. There is a medium amount of serous drainage noted. The wound margin is distinct with the outline attached to the wound base. There is small (1-33%) pink granulation within the wound bed. There is no necrotic tissue within the wound bed. Wound #211 status is Open. Original cause of wound was Gradually Appeared. The date acquired was: 12/05/2021. The wound has been in treatment 4 weeks. The wound is located on the Left,Anterior Ankle. The wound measures 0.6cm length x 2.5cm width x 0.1cm depth; 1.178cm^2 area and 0.118cm^3 volume. There is Fat Layer (Subcutaneous Tissue) exposed. There is no tunneling or undermining noted. There is a medium amount of serosanguineous drainage noted. The wound margin is flat and intact. There is large (67-100%) red granulation within the wound bed. There is a small (1-33%) amount of necrotic tissue within the wound bed including Adherent Slough. Wound #214 status is Open. Original cause of wound was Shear/Friction. The date acquired was: 12/26/2021. The wound has been in treatment 1 weeks. The wound is located on the Left,Posterior T Second. The wound measures 0.5cm length x 3cm width x 0.2cm depth; 1.178cm^2 area and 0.236cm^3 volume. oe There is Fat Layer (Subcutaneous Tissue) exposed. There is a medium amount of serosanguineous drainage noted. The wound margin is flat and intact. There is small (1-33%) granulation within the wound bed. There is a large (67-100%) amount of necrotic tissue within the wound bed including Eschar. 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 There was a Four Layer Compression Therapy Procedure by Shawn Stall, RN. Post procedure Diagnosis Wound #211: Same as Pre-Procedure There was a Four Layer Compression Therapy Procedure by Shawn Stall, RN. Post procedure Diagnosis Wound #: Same as Pre-Procedure Plan Follow-up Appointments: Return Appointment in 1 week. - Wednesday Algis Liming, RN Room 8 Other: - Patient to follow up with Dr. Marlowe Sax at [facility] regarding weight loss. Bathing/ Shower/ Hygiene: May shower with protection but do not get wound dressing(s) wet. Edema Control -  Lymphedema / SCD / Other: Lymphedema Pumps. Use Lymphedema pumps on leg(s) 2-3 times a day for 45-60 minutes. If wearing any wraps or hose, do not remove them. Continue exercising as instructed. Elevate legs to the level of the heart or above for 30 minutes daily and/or when sitting, a frequency of: - throughout the day Avoid standing for long periods of time. Exercise regularly Other Edema Control Orders/Instructions: - right leg 4 layer compression with unna boot to secure to upper portion of lower leg. apply lotion to right leg. WOUND #204: - T  Second Wound Laterality: Left oe Peri-Wound Care: Zinc Oxide Ointment 30g tube 1 x Per Week/30 Days Discharge Instructions: Apply Zinc Oxide to periwound with each dressing change Secondary Dressing: Woven Gauze Sponges 2x2 in 1 x Per Week/30 Days Discharge Instructions: Apply over primary dressing as directed. WOUND #211: - Ankle Wound Laterality: Left, Anterior Peri-Wound Care: Sween Lotion (Moisturizing lotion) 1 x Per Week/30 Days Discharge Instructions: Apply moisturizing lotion as directed Prim Dressing: KerraCel Ag Gelling Fiber Dressing, 2x2 in (silver alginate) 1 x Per Week/30 Days ary Discharge Instructions: Apply silver alginate to wound bed as instructed Secondary Dressing: Woven Gauze Sponge, Non-Sterile 4x4 in 1 x Per Week/30 Days Discharge Instructions: Apply over primary dressing as directed. Secondary Dressing: ABD Pad, 8x10 1 x Per Week/30 Days Discharge Instructions: Apply over primary dressing as directed. Com pression Wrap: FourPress (4 layer compression wrap) 1 x Per Week/30 Days Discharge Instructions: Apply four layer compression . unna layer at top to secure wrap WOUND #214: - T Second Wound Laterality: Left, Posterior oe Cleanser: Soap and Water 1 x Per Day/30 Days Discharge Instructions: May shower and wash wound with dial antibacterial soap and water prior to dressing change. Peri-Wound Care: Zinc Oxide Ointment 30g tube 1 x Per Day/30 Days Discharge Instructions: Apply Zinc Oxide to periwound with each dressing change Secondary Dressing: Woven Gauze Sponges 2x2 in 1 x Per Day/30 Days Discharge Instructions: Apply 2x2 gauze rolled over zinc oxide 1. I would recommend currently that we going continue with the recommendation for the pressure wraps. I feel like that is helpful for him. 2. I am also can recommend at this time that we have the patient continue with the silver alginate dressing to the open wound locations. 3. I would also suggest that the patient  continue to monitor for any overall worsening such as increased pain that could indicate infection right now I see nothing that seems to be infected which is also excellent news. 4. I also did discuss with him weight loss today he has been seeing a weight loss specialist at [facility] although he has not seen him for a bit of time since towards the end of 2022 due to the fact that he has had a lot going on with his activity as far as moving and what not is concerned. At this point again I do believe that that would be beneficial for him he is still living out of a hotel room unfortunately. Electronic Signature(s) Signed: 01/02/2022 11:43:13 AM By: Lenda Kelp PA-C Entered By: Lenda Kelp on 01/02/2022 11:43:13 -------------------------------------------------------------------------------- SuperBill Details Patient Name: Date of Service: [name], [name] 01/02/2022 Medical Record Number: [id number] Patient Account Number: [id number] Date of Birth/Sex: Treating RN: Jan 31, 1951 (71 y.o. 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 L97.812 Non-pressure chronic ulcer of other part of right lower leg with fat layer exposed I87.333 Chronic venous hypertension (idiopathic) with ulcer and inflammation of bilateral lower extremity L03.116 Cellulitis of left lower limb I89.0 Lymphedema, not elsewhere classified E11.40 Type 2 diabetes mellitus with diabetic neuropathy, unspecified E11.622 Type 2 diabetes mellitus with other skin ulcer L03.115 Cellulitis  of right lower limb Facility Procedures CPT4: Code [id number] Description: 81 BILATERAL: Application of multi-layer venous compression system; leg (below knee), including ankle and t. 

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
## 2022-01-09 ENCOUNTER — Encounter: Payer: Self-pay | Admitting: Family Medicine

## 2022-01-09 ENCOUNTER — Other Ambulatory Visit: Payer: Self-pay

## 2022-01-09 ENCOUNTER — Ambulatory Visit (INDEPENDENT_AMBULATORY_CARE_PROVIDER_SITE_OTHER): Payer: Medicare Other | Admitting: Family Medicine

## 2022-01-09 ENCOUNTER — Encounter (HOSPITAL_BASED_OUTPATIENT_CLINIC_OR_DEPARTMENT_OTHER): Payer: Medicare Other | Admitting: Physician Assistant

## 2022-01-09 VITALS — BP 129/52 | HR 77 | Temp 97.8°F | Ht 70.0 in | Wt 399.6 lb

## 2022-01-09 DIAGNOSIS — L97521 Non-pressure chronic ulcer of other part of left foot limited to breakdown of skin: Secondary | ICD-10-CM | POA: Diagnosis not present

## 2022-01-09 DIAGNOSIS — I890 Lymphedema, not elsewhere classified: Secondary | ICD-10-CM | POA: Diagnosis not present

## 2022-01-09 DIAGNOSIS — E1122 Type 2 diabetes mellitus with diabetic chronic kidney disease: Secondary | ICD-10-CM | POA: Diagnosis not present

## 2022-01-09 DIAGNOSIS — N183 Chronic kidney disease, stage 3 unspecified: Secondary | ICD-10-CM | POA: Diagnosis not present

## 2022-01-09 DIAGNOSIS — E1140 Type 2 diabetes mellitus with diabetic neuropathy, unspecified: Secondary | ICD-10-CM | POA: Diagnosis not present

## 2022-01-09 DIAGNOSIS — I10 Essential (primary) hypertension: Secondary | ICD-10-CM | POA: Diagnosis not present

## 2022-01-09 DIAGNOSIS — L97519 Non-pressure chronic ulcer of other part of right foot with unspecified severity: Secondary | ICD-10-CM | POA: Diagnosis not present

## 2022-01-09 DIAGNOSIS — L03116 Cellulitis of left lower limb: Secondary | ICD-10-CM | POA: Diagnosis not present

## 2022-01-09 DIAGNOSIS — L03115 Cellulitis of right lower limb: Secondary | ICD-10-CM | POA: Diagnosis not present

## 2022-01-09 DIAGNOSIS — I87333 Chronic venous hypertension (idiopathic) with ulcer and inflammation of bilateral lower extremity: Secondary | ICD-10-CM | POA: Diagnosis not present

## 2022-01-09 DIAGNOSIS — N1831 Chronic kidney disease, stage 3a: Secondary | ICD-10-CM | POA: Diagnosis not present

## 2022-01-09 DIAGNOSIS — L97312 Non-pressure chronic ulcer of right ankle with fat layer exposed: Secondary | ICD-10-CM | POA: Diagnosis not present

## 2022-01-09 DIAGNOSIS — L97812 Non-pressure chronic ulcer of other part of right lower leg with fat layer exposed: Secondary | ICD-10-CM | POA: Diagnosis not present

## 2022-01-09 DIAGNOSIS — E11621 Type 2 diabetes mellitus with foot ulcer: Secondary | ICD-10-CM | POA: Diagnosis not present

## 2022-01-09 DIAGNOSIS — L97522 Non-pressure chronic ulcer of other part of left foot with fat layer exposed: Secondary | ICD-10-CM | POA: Diagnosis not present

## 2022-01-09 DIAGNOSIS — E11622 Type 2 diabetes mellitus with other skin ulcer: Secondary | ICD-10-CM | POA: Diagnosis not present

## 2022-01-09 DIAGNOSIS — Z794 Long term (current) use of insulin: Secondary | ICD-10-CM | POA: Diagnosis not present

## 2022-01-09 LAB — COMPREHENSIVE METABOLIC PANEL
ALT: 9 U/L (ref 0–53)
AST: 12 U/L (ref 0–37)
Albumin: 3.6 g/dL (ref 3.5–5.2)
Alkaline Phosphatase: 71 U/L (ref 39–117)
BUN: 20 mg/dL (ref 6–23)
CO2: 30 mEq/L (ref 19–32)
Calcium: 8.8 mg/dL (ref 8.4–10.5)
Chloride: 98 mEq/L (ref 96–112)
Creatinine, Ser: 1.56 mg/dL — ABNORMAL HIGH (ref 0.40–1.50)
GFR: 44.64 mL/min — ABNORMAL LOW (ref >60.00)
Glucose, Bld: 190 mg/dL — ABNORMAL HIGH (ref 70–99)
Potassium: 4.8 mEq/L (ref 3.5–5.1)
Sodium: 138 mEq/L (ref 135–145)
Total Bilirubin: 0.9 mg/dL (ref 0.2–1.2)
Total Protein: 6.7 g/dL (ref 6.0–8.3)

## 2022-01-09 LAB — HEMOGLOBIN A1C: Hgb A1c MFr Bld: 8.4 % — ABNORMAL HIGH (ref 4.6–6.5)
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
## 2022-01-09 NOTE — Progress Notes (Addendum)
Kevin, [name] ([id number]) Visit Report for 01/09/2022 Chief Complaint Document Details Patient Name: Date of Service: [name], [name] 01/09/2022 10:30 A M Medical Record Number: [id number] Patient Account Number: [id number] Date of Birth/Sex: Treating RN: [birth year] (71 y.o. M) Primary Care Provider: Nicoletta Ba Other Clinician: Referring Provider: Treating Provider/Extender: Adele Dan in Treatment: 311 Information Obtained from: Patient Chief Complaint Patient is here for evaluation of venous/lymphedema ulcers Electronic Signature(s) Signed: 01/09/2022 10:47:22 AM By: Lenda Kelp PA-C Entered By: Lenda Kelp on 01/09/2022 10:47:22 -------------------------------------------------------------------------------- HPI Details Patient Name: Date of Service: [name], [name] 01/09/2022 10:30 A M Medical Record Number: [id number] Patient Account Number: [id number] Date of Birth/Sex: Treating RN: 09-12-1951 (71 y.o. M) Primary Care Provider: Nicoletta Ba Other Clinician: Referring Provider: Treating Provider/Extender: Adele Dan in Treatment: 311 History of Present Illness HPI Description: Referred by PCP for consultation. Patient has long standing history of BLE venous stasis, no prior ulcerations. At beginning of month, developed cellulitis and weeping. Received IM Rocephin followed by Keflex and resolved. Wears compression stocking, appr 6 months old. Not sure strength. No present drainage. 01/22/16 this is a patient who is a type II diabetic on insulin. 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 He tells me he religiously wears pressure stockings of uncertain strength. He was here with weeping edema about 8 months ago but did not have an open wound. Roughly a month ago he had a reopening on his bilateral legs. He is been using bandages and Neosporin. He does not complain of pain. 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 Last BUN/creatinine I have is from 11/20/15 at 13 and 1.0 respectively 01/29/16; patient arrives today having tolerated the Profore wrap. He brought in his stockings and these are 18 mmHg stockings he bought from [place]. The compression here is likely inadequate. He does not complain of pain or excessive drainage she has no systemic symptoms. The wound on the right looks improved as does the one on the left although one on the left is more substantial with still tissue at risk below the actual wound area on the bilateral posterior calf 02/05/16; patient arrives with poor edema control. He states that we did put a 4 layer compression on it last week. No weight appear 5 this. 02/12/16; the area on the posterior right Has healed. The left Has a substantial wound that has necrotic surface eschar that requires a debridement with a curette. 02/16/16;the patient called or a Nurse visit secondary to increased swelling. He had been in earlier in the week with his right leg healed. He was transitioned to is on pressure stocking on the right leg with the only open wound on the left, a substantial area on the left posterior calf. Note he has a history of severe lower extremity edema, he has a history of chronic atrial fibrillation but not heart failure per my notes but I'll need to research this. He is not complaining of chest pain shortness of breath or orthopnea. The intake nurse noted blisters on the previously closed right leg 02/19/16; this is the patient's regular visit day. I see him on Friday with escalating edema new wounds on the right leg and clear signs of at least right ventricular heart failure. I increased his Lasix to 40 twice a day. He is returning  currently in follow-up. States he is noticed a decrease in that the edema 02/26/16 patient's legs have much less  edema. There is nothing really open on the right leg. The left leg has improved condition of the large superficial wound on the posterior left leg 03/04/16; edema control is very much better. The patient's right leg wounds have healed. On the left leg he continues to have severe venous inflammation on the posterior aspect of the left leg. There is no tenderness and I don't think any of this is cellulitis. 03/11/16; patient's right leg is married healed and he is in his own stocking. The patient's left leg has deteriorated somewhat. There is a lot of erythema around the wound on the posterior left leg. There is also a significant rim of erythema posteriorly just above where the wrap would've ended there is a new wound in this location and a lot of tenderness. Can't rule out cellulitis in this area. 03/15/16; patient's right leg remains healed and he is in his own stocking. The patient's left leg is much better than last review. His major wound on the posterior aspect of his left Is almost fully epithelialized. He has 3 small injuries from the wraps. Really. Erythema seems a lot better on antibiotics 03/18/16; right leg remains healed and he is in his own stocking. The patient's left leg is much better. The area on the posterior aspect of the left calf is fully epithelialized. His 3 small injuries which were wrap injuries on the left are improved only one seems still open his erythema has resolved 03/25/16; patient's right leg remains healed and he is in his own stocking. There is no open area today on the left leg posterior leg is completely closed up. His wrap injuries at the superior aspect of his leg are also resolved. He looks as though he has some irritation on the dorsal ankle but this is fully epithelialized without evidence of infection. 03/28/16; we discharged this patient on Monday. Transitioned him into his own stocking. There were problems almost immediately with uncontrolled swelling weeping edema  multiple some of which have opened. He does not feel systemically unwell in particular no chest pain no shortness of breath and he does not feel 04/08/16; the edema is under better control with the Profore light wrap but he still has pitting edema. There is one large wound anteriorly 2 on the medial aspect of his left leg and 3 small areas on the superior posterior calf. Drainage is not excessive he is tolerating a Profore light well 04/15/16; put a Profore wrap on him last week. This is controlled is edema however he had a lot of pain on his left anterior foot most of his wounds are healed 04/22/16 once again the patient has denuded areas on the left anterior foot which he states are because his wrap slips up word. He saw his primary physician today is on Lasix 40 twice a day and states that he his weight is down 20 pounds over the last 3 months. 04/29/16: Much improved. left anterior foot much improved. He is now on Lasix 80 mg per day. Much improved edema control 05/06/16; I was hoping to be able to discharge him today however once again he has blisters at a low level of where the compression was placed last week mostly on his left lateral but also his left medial leg and a small area on the anterior part of the left foot. 05/09/16; apparently the patient went home after his  appointment on 7/4 later in the evening developing pain in his upper medial thigh together with subjective fever and chills although his temperature was not taken. The pain was so intense he felt he would probably have to call 911. However he then remembered that he had leftover doxycycline from a previous round of antibiotics and took these. By the next morning he felt a lot better. He called and spoke to one of our nurses and I approved doxycycline over the phone thinking that this was in relation to the wounds we had previously seen although they were definitely were not. The patient feels a lot better old fever no chills he is still  working. Blood sugars are reasonably controlled 05/13/16; patient is back in for review of his cellulitis on his anterior medial upper thigh. He is taking doxycycline this is a lot better. Culture I did of the nodular area on the dorsal aspect of his foot grew MRSA this also looks a lot better. 05/20/16; the patient is cellulitis on the medial upper thigh has resolved. All of his wound areas including the left anterior foot, areas on the medial aspect of the left calf and the lateral aspect of the calf at all resolved. He has a new blister on the left dorsal foot at the level of the fourth toe this was excised. No evidence of infection 05/27/16; patient continues to complain weeping edema. He has new blisterlike wounds on the left anterior lateral and posterior lateral calf at the top of his wrap levels. The area on his left anterior foot appears better. He is not complaining of fever, pain or pruritus in his feet. 05/30/16; the patient's blisters on his left anterior leg posterior calf all look improved. He did not increase the Lasix 100 mg as I suggested because he was going to run out of his 40 mg tablets. He is still having weeping edema of his toes 06/03/16; I renewed his Lasix at 80 mg once a day as he was about to run out when I last saw him. He is on 80 mg of Lasix now. I have asked him to cut down on the excessive amount of water he was drinking and asked him to drink according to his thirst mechanisms 06/12/2016 -- was seen 2 days ago and was supposed to wear his compression stockings at home but he is developed lymphedema and superficial blisters on the left lower extremity and hence came in for a review 06/24/16; the remaining wound is on his left anterior leg. He still has edema coming from between his toes. There is lymphedema here however his edema is generally better than when I last saw this. He has a history of atrial fibrillation but does not have a known history of congestive heart  failure nevertheless I think he probably has this at least on a diastolic basis. 07/01/16 I reviewed his echocardiogram from January 2017. This was essentially normal. He did not have LVH, EF of 55-60%. His right ventricular function was normal although he did have trivial tricuspid and pulmonic regurgitation. This is not audible on exam however. I increased his Lasix to do massive edema in his legs well above his knees I think in early July. He was also drinking an excessive amount of water at the time. 07/15/16; missed his appointment last week because of the Labor Day holiday on Monday. He could not get another appointment later in the week. Started to feel the wrap digging in superiorly so we remove the  top half and the bottom half of his wrap. He has extensive erythema and blistering superiorly in the left leg. Very tender. Very swollen. Edema in his foot with leaking edema fluid. He has not been systemically unwell 07/22/16; the area on the left leg laterally required some debridement. The medial wounds look more stable. His wrap injury wounds appear to have healed. Edema and his foot is better, weeping edema is also better. He tells me he is meeting with the supplier of the external compression pumps at work 08/05/16; the patient was on vacation last week in [facility]. His wrap is been on for an extended period of time. Also over the weekend he developed an extensive area of tender erythema across his anterior medial thigh. He took to doxycycline yesterday that he had leftover from a previous prescription. The patient complains of weeping edema coming out of his toes 08/08/16; I saw this patient on 10/2. He was tender across his anterior thigh. I put him on doxycycline. He returns today in follow-up. He does not have any open wounds on his lower leg, he still has edema weeping into his toes. 08/12/16; patient was seen back urgently today to follow-up for his extensive left thigh  cellulitis/erysipelas. He comes back with a lot less swelling and erythema pain is much better. I believe I gave him Augmentin and Cipro. His wrap was cut down as he stated a roll down his legs. He developed blistering above the level of the wrap that remained. He has 2 open blisters and 1 intact. 08/19/16; patient is been doing his primary doctor who is increased his Lasix from 40-80 once a day or 80 already has less edema. Cellulitis has remained improved in the left thigh. 2 open areas on the posterior left calf 08/26/16; he returns today having new open blisters on the anterior part of his left leg. He has his compression pumps but is not yet been shown how to use some vital representative from the supplier. 09/02/16 patient returns today with no open wounds on the left leg. Some maceration in his plantar toes 09/10/2016 -- Dr. Leanord Hawking had recently discharged him on 09/02/2016 and he has come right back with redness swelling and some open ulcers on his left lower extremity. He says this was caused by trying to apply his compression stockings and he's been unable to use this and has not been able to use his lymphedema pumps. He had some doxycycline leftover and he has started on this a few days ago. 09/16/16; there are no open wounds on his leg on the left and no evidence of cellulitis. 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 He does not complain of symptoms here. I am not clear use using his external compression pumps. 09/23/16; I have not seen this patient in 2 weeks. He canceled his appointment 10 days ago as he was going on vacation. He tells me that on Monday he noticed a large area on his posterior left leg which is been draining copiously and is reopened into a large wound. He is been using ABDs and the external part of his juxtalite, according to our nurse this was not on properly. 10/07/16; Still a substantial area on the posterior  left leg. Using silver alginate 10/14/16; in general better although there is still open area which looks healthy. Still using silver alginate. He reminds me that this happen before he left for [facility]. T oday while  he was showering in the morning. He had been using his juxtalite's 10/21/16; the area on his posterior left leg is fully epithelialized. However he arrives today with a large area of tender erythema in his medial and posterior left thigh just above the knee. I have marked the area. Once again he is reluctant to consider hospitalization. I treated him with oral antibiotics in the past for a similar situation with resolution I think with doxycycline however this area it seems more extensive to me. He is not complaining of fever but does have chills and says states he is thirsty. His blood sugar today was in the 140s at home 10/25/16 the area on his posterior left leg is fully epithelialized although there is still some weeping edema. The large area of tenderness and erythema in his medial and posterior left thigh is a lot less tender although there is still a lot of swelling in this thigh. He states he feels a lot better. He is on doxycycline and Augmentin that I started last week. This will continued until Tuesday, December 26. I have ordered a duplex ultrasound of the left thigh rule out DVT whether there is an abscess something that would need to be drained I would also like to know. 11/01/16; he still has weeping edema from a not fully epithelialized area on his left posterior calf. Most of the rest of this looks a lot better. He has completed his antibiotics. His thigh is a lot better. Duplex ultrasound did not show a DVT in the thigh 11/08/16; he comes in today with more Denuded surface epithelium from the posterior aspect of his calf. There is no real evidence of cellulitis. The superior aspect of his wrap appears to have put quite an indentation in his leg just below the knee and  this may have contributed. He does not complain of pain or fever. We have been using silver alginate as the primary dressing. The area of cellulitis in the right thigh has totally resolved. He has been using his compression stockings once a week 11/15/16; the patient arrives today with more loss of epithelium from the posterior aspect of his left calf. He now has a fairly substantial wound in this area. The reason behind this deterioration isn't exactly clear although his edema is not well controlled. He states he feels he is generally more swollen systemically. He is not complaining of chest pain shortness of breath fever. T me he has an appointment with his primary physician in early February. He is on 80 mg of oral ells Lasix a day. He claims compliance with the external compression pumps. He is not having any pain in his legs similar to what he has with his recurrent cellulitis 11/22/16; the patient arrives a follow-up of his large area on his left lateral calf. This looks somewhat better today. He came in earlier in the week for a dressing change since I saw him a week ago. He is not complaining of any pain no shortness of breath no chest pain 11/28/16; the patient arrives for follow-up of his large area on the left lateral calf this does not look better. In fact it is larger weeping edema. The surface of the wound does not look too bad. We have been using silver alginate although I'm not certain that this is a dressing issue. 12/05/16; again the patient follows up for a large wound on the left lateral and left posterior calf this does not look better. There continues to be weeping  edema necrotic surface tissue. More worrisome than this once again there is erythema below the wound involving the distal Achilles and heel suggestive of cellulitis. He is on his feet working most of the day of this is not going well. We are changing his dressing twice a week to facilitate the drainage. 12/12/16; not much  change in the overall dimensions of the large area on the left posterior calf. This is very inflamed looking. I gave him an. Doxycycline last week does not really seem to have helped. He found the wrap very painful indeed it seems to of dog into his legs superiorly and perhaps around the heel. He came in early today because the drainage had soaked through his dressings. 12/19/16- patient arrives for follow-up evaluation of his left lower extremity ulcers. He states that he is using his lymphedema pumps once daily when there is "no drainage". He admits to not using his lipedema pumps while under current treatment. His blood sugars have been consistently between 150-200. 12/26/16; the patient is not using his compression pumps at home because of the wetness on his feet. I've advised him that I think it's important for him to use this daily. He finds his feet too wet, he can put a plastic bag over his legs while he is in the pumps. Otherwise I think will be in a vicious circle. We are using silver alginate to the major area on his left posterior calf 01/02/17; the patient's posterior left leg has further of all into 3 open wounds. All of them covered with a necrotic surface. He claims to be using his compression pumps once a day. His edema control is marginal. Continue with silver alginate 01/10/17; the patient's left posterior leg actually looks somewhat better. There is less edema, less erythema. Still has 3 open areas covered with a necrotic surface requiring debridement. He claims to be using his compression pumps once a day his edema control is better 01/17/17; the patient's left posterior calf look better last week when I saw him and his wrap was changed 2 days ago. He has noted increasing pain in the left heel and arrives today with much larger wounds extensive erythema extending down into the entire heel area especially tender medially. He is not systemically unwell CBGs have been controlled no fever.  Our intake nurse showed me limegreen drainage on his AVD pads. 01/24/17; his usual this patient responds nicely to antibiotics last week giving him Levaquin for presumed Pseudomonas. The whole entire posterior part of his leg is much better much less inflamed and in the case of his Achilles heel area much less tender. He has also had some epithelialization posteriorly there are still open areas here and still draining but overall considerably better 01/31/17- He has continue to tolerate the compression wraps. he states that he continues to use the lymphedema pumps daily, and can increase to twice daily on the weekends. He is voicing no complaints or concerns regarding his LLE ulcers 02/07/17-he is here for follow-up evaluation. He states that he noted some erythema to the left medial and anterior thigh, which he states is new as of yesterday. He is concerned about recurrent cellulitis. He states his blood sugars have been slightly elevated, this morning in the 180s 02/14/17; he is here for follow-up evaluation. When he was last here there was erythema superiorly from his posterior wound in his anterior thigh. He was prescribed Levaquin however a culture of the wound surface grew MRSA over the phone I changed  him to doxycycline on Monday and things seem to be a lot better. 02/24/17; patient missed his appointment on Friday therefore we changed his nurse visit into a physician visit today. Still using silver alginate on the large area of the posterior left thigh. He isn't new area on the dorsal left second toe 03/03/17; actually better today although he admits he has not used his external compression pumps in the last 2 days or so because of work responsibilities over the weekend. 03/10/17; continued improvement. External compression pumps once a day almost all of his wounds have closed on the posterior left calf. Better edema control 03/17/17; in general improved. He still has 3 small open areas on the lateral  aspect of his left leg however most of the area on the posterior part of his leg is epithelialized. He has better edema control. He has an ABD pad under his stocking on the right anterior lower leg although he did not let us look at that today. 03/24/17; patient arrives back in clinic today with no open areas however there are areas on the posterior left calf and anterior left calf that are less than 100% epithelialized. His edema is well controlled in the left lower leg. There is some pitting edema probably lymphedema in the left upper thigh. He uses compression pumps at home once per day. I tried to get him to do this twice a day although he is very reticent. 04/01/2017 -- for the last 2 days he's had significant redness, tenderness and weeping and came in for an urgent visit today. 04/07/17; patient still has 6 more days of doxycycline. He was seen by Dr. Meyer Russel last Wednesday for cellulitis involving the posterior aspect, lateral aspect of his Involving his heel. For the most part he is better there is less erythema and less weeping. He has been on his feet for 12 hours 2 over the weekend. Using his compression pumps once a day 04/14/17 arrives today with continued improvement. Only one area on the posterior left calf that is not fully epithelialized. He has intense bilateral venous inflammation associated with his chronic venous insufficiency disease and secondary lymphedema. We have been using silver alginate to the left posterior calf wound In passing he tells [place] today that the right leg but we have not seen in quite some time has an open area on it but he doesn't want [place] to look at this today states he will show this to [place] next week. 04/21/17; there is no open area on his left leg although he still reports some weeping edema. He showed [place] his right leg today which is the first time we've seen this leg in a long time. He has a large area of open wound on the right leg anteriorly healthy granulation.  Quite a bit of swelling in the right leg and some degree of venous inflammation. He told us about the right leg in passing last week but states that deterioration in the right leg really only happened over the weekend 04/28/17; there is no open area on the left leg although there is an irritated part on the posterior which is like a wrap injury. The wound on the right leg which was new from last week at least to [place] is a lot better. 05/05/17; still no open area on the left leg. Patient is using his new compression stocking which seems to be doing a good job of controlling the edema. He states he is using his compression pumps once per  day. The right leg still has an open wound although it is better in terms of surface area. Required debridement. A lot of pain in the posterior right Achilles marked tenderness. Usually this type of presentation this patient gives concern for an active cellulitis 05/12/17; patient arrives today with his major wound from last week on the right lateral leg somewhat better. Still requiring debridement. He was using his compression stocking on the left leg however that is reopened with superficial wounds anteriorly he did not have an open wound on this leg previously. He is still using his juxta light's once daily at night. He cannot find the time to do this in the morning as he has to be at work by 7 AM 05/19/17; right lateral leg wound looks improved. No debridement required. The concerning area is on the left posterior leg which appears to almost have a subcutaneous hemorrhagic component to it. We've been using silver alginate to all the wounds 05/26/17; the right lateral leg wound continues to look improved. However the area on the left posterior calf is a tightly adherent surface. [place] using silver alginate. Because of the weeping edema in his legs there is very little good alternatives. 06/02/17; the patient left here last week looking quite good. Major wound on the left  posterior calf and a small one on the right lateral calf. Both of these look satisfactory. He tells me that by Wednesday he had noted increased pain in the left leg and drainage. He called on Thursday and Friday to get an appointment here but we were blocked. He did not go to urgent care or his primary physician. He thinks he had a fever on Thursday but did not actually take his temperature. He has not been using his compression pumps on the left leg because of pain. I advised him to go to the emergency room today for IV antibiotics for stents of left leg cellulitis but he has refused I have asked him to take 2 days off work to keep his leg elevated and he has refused this as well. In view of this I'm going to call him and Augmentin and doxycycline. 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 However after a detailed discussion regarding the risk benefits and alternatives he refuses to get admitted to the hospital. With no other recourse I will continue him on oral antibiotics as before and hopefully he'll have his infectious disease consultation this week. 06/16/2017 -- the patient was seen today by the nurse practitioner at infectious disease Ms. Dixon. 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 He has also been advise Lotrimin cream locally. she also had wise IV antibiotics for his condition if it flares up 06/23/17; patient arrives today with drainage bilaterally although the remaining wound on the left posterior calf after cleaning up today "highlighter yellow drainage" did not  look too bad. Unfortunately he has had breakdown on the right anterior leg [previously this leg had not been open and he is using a black stocking] he went to see infectious disease and is been  put on clindamycin 150 mg daily, I did not verify the dose although I'm not familiar with using clindamycin in this dosing range, perhaps for prophylaxisoo 06/27/17; I brought this patient back today to follow-up on the wound deterioration on the right lower leg together with surrounding cellulitis. I started him on doxycycline 4 days ago. This area looks better however he comes in today with intense cellulitis on the medial part of his left thigh. This is not have a wound in this area. Extremely tender. We've been using silver alginate to the wounds on the right lower leg left lower leg with bilateral 4 layer compression he is using his external compression pumps once a day 07/04/17; patient's left medial thigh cellulitis looks better. He has not been using his compression pumps as his insert said it was contraindicated with cellulitis. His right leg continues to make improvements all the wounds are still open. We only have one remaining wound on the left posterior calf. Using silver alginate to all open areas. He is on doxycycline which I started a week ago and should be finishing I gave him Augmentin after Thursday's visit for the severe cellulitis on the left medial thigh which fortunately looks better 07/14/17; the patient's left medial thigh cellulitis has resolved. The cellulitis in his right lower calf on the right also looks better. All of his wounds are stable to improved we've been using silver alginate he has completed the antibiotics I have given him. He has clindamycin 150 mg once a day prescribed by infectious disease for prophylaxis, I've advised him to start this now. We have been using bilateral Unna boots over silver alginate to the wound areas 07/21/17; the patient is been to see infectious  disease who noted his recurrent problems with cellulitis. He was not able to tolerate prophylactic clindamycin therefore he is on amoxicillin 500 twice a day. He also had a second daily dose of Lasix added By Dr. Oneta Rack but he is not taking this. Nor is he being completely compliant with his compression pumps a especially not this week. He has 2 remaining wounds one on the right posterior lateral lower leg and one on the left posterior medial lower leg. 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 The patient has Unna boots bilaterally. 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 He has bilateral Unna boots for compression and silver alginate to his wounds. Arrives today with his legs looking as good as I have seen him in quite some time. Not surprisingly his wounds look better as well with improvement on the right lateral leg venous insufficiency wound and also the left medial leg. He is still using the compression pumps once a day 08/11/17; both legs appear to be doing better wounds on the right lateral and left medial legs look better. Skin on the right leg quite good. He is been using silver alginate as the primary dressing. I'm going to use Anasept gel calcium alginate and maintain all the secondary dressings 08/18/17; the patient continues to actually do quite well. The area on his right lateral leg is just about closed the left medial also looks better although it is still moist in this area. His edema is well controlled we have been using Anasept gel with calcium alginate and the usual secondary  dressings, 4 layer compression and once daily use of his compression pumps "always been able to manage 09/01/17; the patient continues to do reasonably well in spite of [place]. The area on the right lateral leg is epithelialized. Left is much better but still open. He has more edema and more chronic erythema on the left leg [venous inflammation] 09/08/17; he arrives today with no open wound on the right lateral leg and decently controlled edema. Unfortunately his left leg is not nearly as in his good situation as last week.he apparently had increasing edema starting on Saturday. He edema soaked through into his foot so used a plastic bag to walk around his home. The area on the medial right leg which was his open area is about the same however he has lost surface epithelium on the left lateral which is new and he has significant pain in the Achilles area of the left foot. He is already on amoxicillin chronically for prophylaxis of cellulitis in the left leg 09/15/17; he is completed a week of doxycycline and the cellulitis in the left posterior leg and Achilles area is as usual improved. He still has a lot of edema and fluid soaking through his dressings. There is no open wound on the right leg. He saw infectious disease NP today 09/22/17;As usual 1 we transition him from our compression wraps to his stockings things did not go well. He has several small open areas on the right leg. He states this was caused by the compression wrap on his skin although he did not wear this with the stockings over them. He has several superficial areas on the left leg medially laterally posteriorly. He does not have any evidence of active cellulitis especially involving the left Achilles The patient is traveling from [facility] Saturday going to [facility]. He states he isn't attempting to get an appointment with a heel objects wound center there to change his dressings. I am not completely certain whether this will work 10/06/17; the patient came in on Friday for a nurse visit and the nurse reported that his legs actually look quite good. He arrives in clinic today for his  regular follow-up visit. He has a new wound on his left third toe over the PIP probably caused by friction with his footwear. He has small areas on the left leg and a very superficial but epithelialized area on the right anterior lateral lower leg. Other than that his legs look as good as I've seen him in quite some time. We have been using silver alginate Review of systems; no chest pain no shortness of breath other than this a 10 point review of systems negative 10/20/17; seen by Dr. Meyer Russel last week. He had taken some antibiotics [doxycycline] that he had left over. Dr. Meyer Russel thought he had candida infection and declined to give him further antibiotics. He has a small wound remaining on the right lateral leg several areas on the left leg including a larger area on the left posterior several left medial and anterior and a small wound on the left lateral. The area on the left dorsal third toe looks a lot better. ROS; Gen.; no fever, respiratory no cough no sputum Cardiac no chest pain other than this 10 point review of system is negative 10/30/17; patient arrives today having fallen in the bathtub 3 days ago. It took him a while to get up. He has pain and maceration in the wounds on his left leg which have  deteriorated. He has not been using his pumps he also has some maceration on the right lateral leg. 11/03/17; patient continues to have weeping edema especially in the left leg. This saturates his dressings which were just put on on 12/27. As usual the doxycycline seems to take care of the cellulitis on his lower leg. He is not complaining of fever, chills, or other systemic symptoms. He states his leg feels a lot better on the doxycycline I gave him empirically. He also apparently gets injections at his primary doctor's officeo Rocephin for cellulitis prophylaxis. I didn't ask him about his compression pump compliance today I think that's probably marginal. Arrives in the clinic with all of his  dressings primary and secondary macerated full of fluid and he has bilateral edema 11/10/17; the patient's right leg looks some better although there is still a cluster of wounds on the right lateral. The left leg is inflamed with almost circumferential skin loss medially to laterally although we are still maintaining anteriorly. He does not have overt cellulitis there is a lot of drainage. He is not using compression pumps. We have been using silver alginate to the wound areas, there are not a lot of options here 11/17/17; the patient's right leg continues to be stable although there is still open wounds, better than last week. The inflammation in the left leg is better. Still loss of surface layer epithelium especially posteriorly. There is no overt cellulitis in the amount of edema and his left leg is really quite good, tells me he is using his compression pumps once a day. 11/24/17; patient's right leg has a small superficial wound laterally this continues to improve. The inflammation in the left leg is still improving however we have continuous surface layer epithelial loss posteriorly. There is no overt cellulitis in the amount of edema in both legs is really quite good. He states he is using his compression pumps on the left leg once a day for 5 out of 7 days 12/01/17; very small superficial areas on the right lateral leg continue to improve. Edema control in both legs is better today. He has continued loss of surface epithelialization and left posterior calf although I think this is better. We have been using silver alginate with large number of absorptive secondary dressings 4 layer on the left Unna boot on the right at his request. He tells me he is using his compression pumps once a day 12/08/17; he has no open area on the right leg is edema control is good here. On the left leg however he has marked erythema and tenderness breakdown of skin. He has what appears to be a wrap injury just distal to  the popliteal fossa. 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 There was swelling erythema and breakdown in the right leg. He has much worse skin breakdown in the left leg as well multiple open areas medially and posteriorly as well as laterally. He tells me he has been using his compression pumps but tells me he feels that the drainage out of his leg is worse when he uses a compression pumps. T be fair to him he is been saying this o for a while however I don't know that I  have really been listening to this. I wonder if the compression pumps are working properly 12/22/17;. Once again he arrives with severe erythema, weeping edema from the left greater than right leg. Noncompliance with compression pumps. New this visit he is complaining of pain on the lateral aspect of the right leg and the medial aspect of his right thigh. He apparently saw his cardiologist Dr. Rennis GoldenHilty who was ordered an echocardiogram area and I think this is a step in the right direction 12/25/17; started his doxycycline Monday night. There is still intense erythema of the right leg especially in the anterior thigh although there is less tenderness. The erythema around the wound on the right lateral calf also is less tender. He still complaining of pain in the left heel. His wounds are about the same right lateral left medial left lateral. Superficial but certainly not close to closure. He denies being systemically unwell no fever chills no abdominal pain no diarrhea 12/29/17; back in follow-up of his extensive right calf and right thigh cellulitis. I added amoxicillin to cover possible doxycycline resistant strep. This seems to of done the trick he is in much  less pain there is much less erythema and swelling. He has his echocardiogram at 11:00 this morning. X-ray of the left heel was also negative. 01/05/18; the patient arrived with his edema under much better control. Now that he is retired he is able to use his compression pumps daily and sometimes twice a day per the patient. He has a wound on the right leg the lateral wound looks better. Area on the left leg also looks a lot better. He has no evidence of cellulitis in his bilateral thighs I had a quick peak at his echocardiogram. He is in normal ejection fraction and normal left ventricular function. He has moderate pulmonary hypertension moderately reduced right ventricular function. One would have to wonder about chronic sleep apnea although he says he doesn't snore. He'll review the echocardiogram with his cardiologist. 01/12/18; the patient arrives with the edema in both legs under exemplary control. He is using his compression pumps daily and sometimes twice daily. His wound on the right lateral leg is just about closed. He still has some weeping areas on the posterior left calf and lateral left calf although everything is just about closed here as well. I have spoken with Aldean BakerStephanie Dickson who is the patient's nurse practitioner and infectious disease. She was concerned that the patient had not understood that the parenteral penicillin injections he was receiving for cellulitis prophylaxis was actually benefiting him. I don't think the patient actually saw that I would tend to agree we were certainly dealing with less infections although he had a serious one last month. 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 He is healed. He'll be placed in juxtalite compression wraps and increase his lymphedema pumps to twice daily. We will follow up again next week to ensure there are no issues with the new regiment. 01/20/18-he is here for evaluation of bilateral lower  extremity weeping edema. Yesterday he was placed in compression wrap to the right lower extremity and compression stocking to left lower shrubbery. He states he uses lymphedema pumps last night and again this morning and noted a blister to the left lower extremity. On exam he was noted to have drainage to the right lower extremity. He will be placed in Unna boots bilaterally and follow-up next week 01/26/18; patient was actually discharged a week ago to his  own juxta light stockings only to return the next day with bilateral lower extremity weeping edema.he was placed in bilateral Unna boots. He arrives today with pain in the back of his left leg. There is no open area on the right leg however there is a linear/wrap injury on the left leg and weeping edema on the left leg posteriorly. I spoke with infectious disease about 10 days ago. They were disappointed that the patient elected to discontinue prophylactic intramuscular penicillin shots as they felt it was particularly beneficial in reducing the frequency of his cellulitis. I discussed this with the patient today. He does not share this view. He'll definitely need antibiotics today. Finally he is traveling to [place] and trauma leaving this Saturday and returning a week later and he does not travel with his pumps. He is going by car 01/30/18; patient was seen 4 days ago and brought back in today for review of cellulitis in the left leg posteriorly. I put him on amoxicillin this really hasn't helped as much as I might like. He is also worried because he is traveling to [facility] trauma by car. Finally we will be rewrapping him. There is no open area on the right leg over his left leg has multiple weeping areas as usual 02/09/18; The same wrap on for 10 days. He did not pick up the last doxycycline I prescribed for him. He apparently took 4 days worth he already had. There is nothing open on his right leg and the edema control is really quite good. He's  had damage in the left leg medially and laterally especially probably related to the prolonged use of Unna boots 02/12/18; the patient arrived in clinic today for a nurse visit/wrap change. He complained of a lot of pain in the left posterior calf. He is taking doxycycline that I previously prescribed for him. Unfortunately even though he used his stockings and apparently used to compression pumps twice a day he has weeping edema coming out of the lateral part of his right leg. This is coming from the lower anterior lateral skin area. 02/16/18; the patient has finished his doxycycline and will finish the amoxicillin 2 days. The area of cellulitis in the left calf posteriorly has resolved. He is no longer having any pain. He tells me he is using his compression pumps at least once a day sometimes twice. 02/23/18; the patient finished his doxycycline and Amoxil last week. On Friday he noticed a small erythematous circle about the size of a quarter on the left lower leg just above his ankle. This rapidly expanded and he now has erythema on the lateral and posterior part of the thigh. This is bright red. Also has an area on the dorsal foot just above his toes and a tender area just below the left popliteal fossa. He came off his prophylactic penicillin injections at his own insistence one or 2 months ago. This is obviously deteriorated since then 03/02/18; patient is on doxycycline and Amoxil. Culture I did last week of the weeping area on the back of his left calf grew group B strep. I have therefore renewed the amoxicillin 500 3 times a day for a further week. He has not been systemically unwell. Still complaining of an area of discomfort right under his left popliteal fossa. There is no open wound on the right leg. He tells me that he is using his pumps twice a day on most days 03/09/18; patient arrives in clinic today completing his amoxicillin today. The  cellulitis on his left leg is better. Furthermore he  tells me that he had intramuscular penicillin shots that his primary care office today. However he also states that the wrap on his right leg fell down shortly after leaving clinic last week. He developed a large blister that was present when he came in for a nurse visit later in the week and then he developed intense discomfort around this area.He tells me he is using his compression pumps 03/16/18; the patient has completed his doxycycline. The infectious part of this/cellulitis in the left heel area left popliteal area is a lot better. He has 2 open areas on the right calf. Still areas on the left calf but this is a lot better as well. 03/24/18; the patient arrives complaining of pain in the left popliteal area again. He thinks some of this is wrap injury. He has no open area on the right leg and really no open area on the left calf either except for the popliteal area. He claims to be compliant with the compression pumps 03/31/18; I gave him doxycycline last week because of cellulitis in the left popliteal area. This is a lot better although the surface epithelium is denuded off and response to this. He arrives today with uncontrolled edema in the right calf area as well as a fingernail injury in the right lateral calf. There is only a few open areas on the left 04/06/18; I gave him amoxicillin doxycycline over the last 2 weeks that the amoxicillin should be completing currently. He is not complaining of any pain or systemic symptoms. The only open areas see has is on the right lateral lower leg paradoxically I cannot see anything on the left lower leg. He tells me he is using his compression pumps twice a day on most days. Silver alginate to the wounds that are open under 4 layer compression 04/13/18; he completed antibiotics and has no new complaints. Using his compression pumps. Silver alginate that anything that's opened 04/20/18; he is using his compression pumps religiously. Silver alginate 4 layer  compression anything that's opened. He comes in today with no open wounds on the left leg but 3 on the right including a new one posteriorly. He has 2 on the right lateral and one on the right posterior. He likes Unna boots on the right leg for reasons that aren't really clear we had the usual 4 layer compression on the left. It may be necessary to move to the 4 layer compression on the right however for now I left them in the Unna boots 04/27/18; he is using his compression pumps at least once a day. He has still the wounds on the right lateral calf. The area right posteriorly has closed. He does not have an open wound on the left under 4 layer compression however on the dorsal left foot just proximal to the toes and the left third toe 2 small open areas were identified 05/11/18; he has not uses compression pumps. The areas on the right lateral calf have coalesced into one large wound necrotic surface. On the left side he has one small wound anteriorly however the edema is now weeping out of a large part of his left leg. He says he wasn't using his pumps because of the weeping fluid. I explained to him that this is the time he needs to pump more 05/18/18; patient states he is using his compression pumps twice a day. The area on the right lateral large wound albeit superficial.  On the left side he has innumerable number of small new wounds on the left calf particularly laterally but several anteriorly and medially. All these appear to have healthy granulated base these look like the remnants of blisters however they occurred under compression. The patient arrives in clinic today with his legs somewhat better. There is certainly less edema, less multiple open areas on the left calf and the right anterior leg looks somewhat better as well superficial and a little smaller. However he relates pain and erythema over the last 3-4 days in the thigh and I looked at this today. He has not been systemically unwell  no fever no chills no change in blood sugar values 05/25/18; comes in today in a better state. The severe cellulitis on his left leg seems better with the Keflex. Not as tender. He has not been systemically unwell Hard to find an open wound on the left lower leg using his compression pumps twice a day The confluent wounds on his right lateral calf somewhat better looking. These will ultimately need debridement I didn't do this today. 06/01/18; the severe cellulitis on the left anterior thigh has resolved and he is completed his Keflex. There is no open wound on the left leg however there is a superficial excoriation at the base of the third toe dorsally. Skin on the bottom of his left foot is macerated looking. The left the wounds on the lateral right leg actually looks some better although he did require debridement of the top half of this wound area with an open curet 06/09/18 on evaluation today patient appears to be doing poorly in regard to his right lower extremity in particular this appears to likely be infected he has very thick purulent discharge along with a bright green tent to the discharge. This makes me concerned about the possibility of pseudomonas. He's also having increased discomfort at this point on evaluation. Fortunately there does not appear to be any evidence of infection spreading to the other location at this time. 06/16/18 on evaluation today patient appears to actually be doing fairly well. His ulcer has actually diminished in size quite significantly at this point which is good news. Nonetheless he still does have some evidence of infection he did see infectious disease this morning before coming here for his appointment. I did review the results of their evaluation and their note today. They did actually have him discontinue the Cipro and initiate treatment with linezolid at this time. He is doing this for the next seven days and they recommended a follow-up in four months with  them. He is the keep a log of the need for intermittent antibiotic therapy between now and when he falls back up with infectious disease. This will help them gaze what exactly they need to do to try and help them out. 06/23/18; the patient arrives today with no open wounds on the left leg and left third toe healed. He is been using his compression pumps twice a day. On the right lateral leg he still has a sizable wound but this is a lot better than last time I saw this. 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 Has been using silver alginate under 4 layer compression 06/30/18; the only open wound he has is on the right lateral leg and this looks healthy. No debridement is required. We have been using silver alginate. He does not  have an open wound on the left leg. There is apparently some drainage from the dorsal proximal third toe on the left although I see no open wound here. 07/03/18 on evaluation today patient was actually here just for a nurse visit rapid change. However when he was here on Wednesday for his rat change due to having been healed on the left and then developing blisters we initiated the wrap again knowing that he would be back today for [place] to reevaluate and see were at. Unfortunately he has developed some cellulitis into the proximal portion of his right lower extremity even into the region of his thigh. 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 He was placed on one as what at that point. Nonetheless he is done with that and has been tolerating it well otherwise. Doxycycline which in the past really did not seem to be effective for him. Nonetheless I think the best option may be for [place] to definitely reinitiate the antibiotics for a longer period of time. 07/07/18; since I last saw this patient a week ago he has had a difficult time. At that  point he did not have an open wound on his left leg. We transitioned him into juxta light stockings. He was apparently in the clinic the next day with blisters on the left lateral and left medial lower calf. He also had weeping edema fluid. He was put back into a compression wrap. He was also in the clinic on Friday with intense erythema in his right thigh. Per the patient he was started on Bactrim however that didn't work at all in terms of relieving his pain and swelling. He has taken 3 doxycycline that he had left over from last time and that seems to of helped. He has blistering on the right thigh as well. 07/14/18; the erythema on his right thigh has gotten better with doxycycline that he is finishing. 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 Presumably this cellulitis in the thigh was not related to that although I think this is a bit concerning going forward. He still has an area on the right lateral calf the blister on the right medial calf just below the knee that was discussed above. On the left 2 small open areas left medial and left lateral. Edema control is adequate. He is using his compression pumps twice a day 07/20/18; continued improvement in the condition of both legs especially the edema in his bilateral thighs. He tells me he is been losing weight through a combination of diet and exercise. He is using his compression pumps twice a day. So overall she made to the remaining wounds 07/27/2018; continued improvement in condition of both legs. His edema is well controlled. The area on the right lateral leg is just about closed he had one blisters show up on the medial left upper calf. We have him in 4 layer compression. He is going on a 10-day trip to [place], [place] and [place]. He will be driving. He wants to wear Unna boots because of the lessening amount of constriction. He will not use compression  pumps while he is away 08/05/18 on evaluation today patient actually appears to be doing decently well all things considered in regard to his bilateral lower extremities. The worst ulcer is actually only posterior aspect of his left lower extremity with a four  layer compression wrap cut into his leg a couple weeks back. He did have a trip and actually had [employer] for the trip that he is worn since he was last here. Nonetheless he feels like the [employer] actually do better for him his swelling is up a little bit but he also with his trip was not taking his Lasix on a regular set schedule like he was supposed to be. He states that obviously the reason being that he cannot drive and keep going without having to urinate too frequently which makes it difficult. He did not have his pumps with him while he was away either which I think also maybe playing a role here too. 08/13/2018; the patient only has a small open wound on the right lateral calf which is a big improvement in the last month or 2. He also has the area posteriorly just below the posterior fossa on the left which I think was a wrap injury from several weeks ago. He has no current evidence of cellulitis. He tells me he is back into his compression pumps twice a day. He also tells me that while he was at the laundromat somebody stole a section of his extremitease stockings 08/20/2018; back in the clinic with a much improved state. He only has small areas on the right lateral mid calf which is just about healed. This was is more substantial area for quite a prolonged period of time. He has a small open area on the left anterior tibia. The area on the posterior calf just below the popliteal fossa is closed today. He is using his compression pumps twice a day 08/28/2018; patient has no open wound on the right leg. He has a smattering of open areas on the calf with some weeping lymphedema. More problematically than that it looks as  though his wraps of slipped down in his usual he has very angry upper area of edema just below the right medial knee and on the right lateral calf. He has no open area on his feet. The patient is traveling to [facility] next week. I will send him in an antibiotic. We will continue to wrap the right leg. We ordered extremitease stockings for him last week and I plan to transition the right leg to a stocking when he gets home which will be in 10 days time. As usual he is very reluctant to take his pumps with him when he travels 09/07/2018; patient returns from [facility]. He shows me a picture of his left leg in the mid part of his trip last week with intense fire engine erythema. The picture look bad enough I would have considered sending him to the hospital. Instead he went to the wound care center in [facility]. They did not prescribe him antibiotics but he did take some doxycycline he had leftover from a previous visit. I had given him trimethoprim sulfamethoxazole before he left this did not work according to the patient. This is resulted in some improvement fortunately. He comes back with a large wound on the left posterior calf. Smaller area on the left anterior tibia. Denuded blisters on the dorsal left foot over his toes. Does not have much in the way of wounds on the right leg although he does have a very tender area on the right posterior area just below the popliteal fossa also suggestive of infection. He promises me he is back on his pumps twice a day 09/15/2018; the intense  cellulitis in his left lower calf is a lot better. The wound area on the posterior left calf is also so better. However he has reasonably extensive wounds on the dorsal aspect of his second and third toes and the proximal foot just at the base of the toes. There is nothing open on the right leg 09/22/2018; the patient has excellent edema control in his legs bilaterally. He is using his external compression  pumps twice a day. He has no open area on the right leg and only the areas in the left foot dorsally second and third toe area on the left side. He does not have any signs of active cellulitis. 10/06/2018; the patient has good edema control bilaterally. He has no open wound on the right leg. There is a blister in the posterior aspect of his left calf that we had to deal with today. He is using his compression pumps twice a day. There is no signs of active cellulitis. We have been using silver alginate to the wound areas. He still has vulnerable areas on the base of his left first second toes dorsally He has a his extremities stockings and we are going to transition him today into the stocking on the right leg. He is cautioned that he will need to continue to use the compression pumps twice a day. If he notices uncontrolled edema in the right leg he may need to go to 3 times a day. 10/13/2018; the patient came in for a nurse check on Friday he has a large flaccid blister on the right medial calf just below the knee. We unroofed this. He has this and a new area underneath the posterior mid calf which was undoubtedly a blister as well. He also has several small areas on the right which is the area we put his extremities stocking on. 10/19/2018; the patient went to see infectious disease this morning I am not sure if that was a routine follow-up in any case the doxycycline I had given him was discontinued and started on linezolid. He has not started this. It is easy to look at his left calf and the inflammation and think this is cellulitis however he is very tender in the tissue just below the popliteal fossa and I have no doubt that there is infection going on here. He states the problem he is having is that with the compression pumps the edema goes down and then starts walking the wrap falls down. We will see if we can adhere this. He has 1 or 2 minuscule open areas on the right still areas that are  weeping on the posterior left calf, the base of his left second and third toes 10/26/18; back today in clinic with quite of skin breakdown in his left anterior leg. This may have been infection the area below the popliteal fossa seems a lot better however tremendous epithelial loss on the left anterior mid tibia area over quite inexpensive tissue. He has 2 blisters on the right side but no other open wound here. 10/29/2018; came in urgently to see [place] today and we worked him in for review. He states that the 4 layer compression on the right leg caused pain he had to cut it down to roughly his mid calf this caused swelling above the wrap and he has blisters and skin breakdown today. As a result of the pain he has not been using his pumps. Both legs are a lot more edematous and there is a lot of  weeping fluid. 11/02/18; arrives in clinic with continued difficulties in the right leg> left. Leg is swollen and painful. multiple skin blisters and new open areas especially laterally. He has not been using his pumps on the right leg. 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 He is not systemically unwell. 11/09/2018; the patient claims he is being compliant with his pumps. He is finished the doxycycline I gave him last week. Culture I did of the wound on the right lateral leg showed a few very resistant methicillin staph aureus. This was resistant to doxycycline. Nevertheless he states the pain in the leg is a lot better which makes me wonder if the cultured organism was not really what was causing the problem nevertheless this is a very dangerous organism to be culturing out of any wound. His right leg is still a lot larger than the left. He is using an Radio broadcast assistant on this area, he blames a 4-layer compression for causing the original skin breakdown which I doubt is true however I cannot talk him out of it. We have been using silver alginate to all of these areas  which were initially blisters 11/16/2018; patient is being compliant with his external compression pumps at twice a day. Miraculously he arrives in clinic today with absolutely no open wounds. He has better edema control on the left where he has been using 4 layer compression versus wound of wounds on the right and I pointed this out to him. There is no inflammation in the skin in his lower legs which is also somewhat unusual for him. There is no open wounds on the dorsal left foot. He has extremitease stockings at home and I have asked him to bring these in next week. 11/25/18 patient's lower extremity on examination today on the left appears for the most part to be wound free. He does have an open wound on the lateral aspect of the right lower extremity but this is minimal compared to what I've seen in past. He does request that we go ahead and wrap the left leg as well even though there's nothing open just so hopefully it will not reopen in short order. 1/28; patient has superficial open wounds on the right lateral calf left anterior calf and left posterior calf. His edema control is adequate. 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 We have been using silver alginate as the primary dressing. He claims compliance with his compression pumps 2/4; patient has superficial open wounds on numerous areas of his left calf and again one on the left dorsal foot. The areas on the right lateral calf have healed. The cellulitis that I gave him doxycycline for last week is also resolved this was mostly on the left anterior calf just below the tibial tuberosity. His edema looks fairly well-controlled. He tells me he went to see his primary doctor today and had blood work ordered 2/11; once again he has several open areas on the left calf left tibial area. Most of these are small and appear to have healthy granulation. He does not  have anything open on the right. The edema and control in his thighs is pretty good which is usually a good indication he has been using his pumps as requested. 2/18; he continues to have several small areas on the left calf and left tibial area. Most of these are small healthy  granulation. We put him in his stocking on the right leg last week and he arrives with a superficial open area over the right upper tibia and a fairly large area on the right lateral tibia in similar condition. His edema control actually does not look too bad, he claims to be using his compression pumps twice a day 2/25. Continued small areas on the left calf and left tibial area. New areas especially on the right are identified just below the tibial tuberosity and on the right upper tibia itself. There are also areas of weeping edema fluid even without an obvious wound. He does not have a considerable degree of lymphedema but clearly there is more edema here than his skin can handle. He states he is using the pumps twice a day. We have an Unna boot on the right and 4 layer compression on the left. 3/3; he continues to have an area on the right lateral calf and right posterior calf just below the popliteal fossa. There is a fair amount of tenderness around the wound on the popliteal fossa but I did not see any evidence of cellulitis, could just be that the wrap came down and rubbed in this area. He does not have an open area on the left leg however there is an area on the left dorsal foot at the base of the third toe We have been using silver alginate to all wound areas 3/10; he did not have an open area on his left leg last time he was here a week ago. T oday he arrives with a horizontal wound just below the tibial tuberosity and an area on the left lateral calf. He has intense erythema and tenderness in this area. The area is on the right lateral calf and right posterior calf better than last week. We have been using silver  alginate as usual 3/18 - Patient returns with 3 small open areas on left calf, and 1 small open area on right calf, the skin looks ok with no significant erythema, he continues the UNA boot on right and 4 layer compression on left. The right lateral calf wound is closed , the right posterior is small area. we will continue silver alginate to the areas. 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 He is been tolerating the dressing changes without complication. Fortunately she has made excellent improvement in regard to the overall status of his wounds. Unfortunately every time we cease wrapping him he ends up reopening in causing more significant issues at that point. Again I'm unsure of the best direction to take although I think the lymphedema clinic may be appropriate for him. 02/03/19 on evaluation today patient appears to be doing well in regard to the wounds that we saw him for last week unfortunately he has a new area on the proximal portion of his right medial/posterior lower extremity where the wrap somewhat slowed down and caused swelling and a blister to rub and open. Unfortunately this is the only opening that he has on either leg at this point. 02/17/19 on evaluation today patient's bilateral lower extremities appear to be doing well. He still completely healed in regard to the left lower extremity. In regard to the right lower extremity the area where the wrap and slid down and caused the blister still seems to  be slightly open although this is dramatically better than during the last evaluation two weeks ago. I'm very pleased with the way this stands overall. 03/03/19 on evaluation today patient appears to be doing well in regard to his right lower extremity in general although he did have a new  blister open this does not appear to be showing any evidence of active infection at this time. Fortunately there's No fevers, chills, nausea, or vomiting noted at this time. Overall I feel like he is making good progress it does feel like that the right leg will we perform the [employer] seems to do with a bit better than three layer wrap on the left which slid down on him. We may switch to doing bilateral in the book wraps. 5/4; I have not seen Mr. Dedominicis in quite some time. According to our case manager he did not have an open wound on his left leg last week. He had 1 remaining wound on the right posterior medial calf. He arrives today with multiple openings on the left leg probably were blisters and/or wrap injuries from Unna boots. I do not think the Unna boot's will provide adequate compression on the left. I am also not clear about the frequency he is using the compression pumps. 03/17/19 on evaluation today patient appears to be doing excellent in regard to his lower extremities compared to last week's evaluation apparently. He had gotten significantly worse last week which is unfortunate. The [employer] wrap on the left did not seem to do very well for him at all and in fact it didn't control his swelling significantly enough he had an additional outbreak. Subsequently we go back to the four layer compression wrap on the left. This is good news. At least in that he is doing better and the wound seem to be killing him. He still has not heard anything from the lymphedema clinic. 03/24/19 on evaluation today patient actually appears to be doing much better in regard to his bilateral lower Trinity as compared to last week when I saw him. Fortunately there's no signs of active infection at this time. He has been tolerating the dressing changes without complication. Overall I'm extremely pleased with the progress and appearance in general. 04/07/19 on evaluation today patient appears to be doing well in  regard to his bilateral lower extremities. His swelling is significantly down from where it was previous. With that being said he does have a couple blisters still open at this point but fortunately nothing that seems to be too severe and again the majority of the larger openings has healed at this time. 04/14/19 on evaluation today patient actually appears to be doing quite well in regard to his bilateral lower extremities in fact I'm not even sure there's anything significantly open at this time at any site. Nonetheless he did have some trouble with these wraps where they are somewhat irritating him secondary to the fact that he has noted that the graph wasn't too close down to the end of this foot in a little bit short as well up to his knee. Otherwise things seem to be doing quite well. 04/21/19 upon evaluation today patient's wound bed actually showed evidence of being completely healed in regard to both lower extremities which is excellent news. There does not appear to be any signs of active infection which is also good news. I'm very pleased in this regard. No fevers, chills, nausea, or vomiting noted at this time. 04/28/19 on evaluation today  patient appears to be doing a little bit worse in regard to both lower extremities on the left mainly due to the fact that when he went infection disease the wrap was not wrapped quite high enough he developed a blister above this. On the right he is a small open area of nothing too significant but again this is continuing to give him some trouble he has been were in the Velcro compression that he has at home. 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 He's been tolerating the dressing changes without complication. Fortunately there's no signs of active infection at this time. No fevers, chills, nausea, or vomiting noted at this time. We have been trying to get an appointment with her lymphedema  clinic in [place] [facility] but unfortunately nobody can get them on phone with not been able to even fax information over the patient likewise is not been able to get in touch with them. Overall I'm not sure exactly what's going on here with to reach out again today. 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 Still having a lot of drainage unfortunately. He tells me especially in the left but even on the right. There's no signs of active infection which is good news we've been using so ratcheted up to this point. 05/19/19 on evaluation today patient actually appears to be doing quite well with regard to his left lower extremity which is great news. Fortunately in regard to the right lower extremity has an issues with his wrap and he subsequently did remove this from what I'm understanding. Nonetheless long story short is what he had rewrapped once he removed it subsequently had maggots underneath this wrap whenever he came in for evaluation today. With that being said they were obviously completely cleaned away by the nursing staff. The visit today which is excellent news. However he does appear to potentially have some infection around the right ankle region where the maggots were located as well. He will likely require anabiotic therapy today. 05/26/19 on evaluation today patient actually appears to be doing much better in regard to his bilateral lower extremities. I feel like the infection is under much better control. With that being said there were maggots noted when the wrap was removed yet again today. Again this could have potentially been left over from previous although at this time there does not appear to be any signs of significant drainage there was obviously on the wrap some drainage as well this contracted gnats or otherwise. Either way I do not see anything that appears to be doing worse in my  pinion and in fact I think his drainage has slowed down quite significantly likely mainly due to the fact to his infection being under better control. 06/02/2019 on evaluation today patient actually appears to be doing well with regard to his bilateral lower extremities there is no signs of active infection at this time which is great news. With that being said he does have several open areas more so on the right than the left but nonetheless these are all significantly better than previously noted. 06/09/2019 on evaluation today patient actually appears to be doing well. His wrap stayed up and he did not cause any problems he had more drainage on the right compared to the left but overall I do not see any major issues at this time which  is great news. 06/16/2019 on evaluation today patient appears to be doing excellent with regard to his lower extremities the only area that is open is a new blister that can have opened as of today on the medial ankle on the left. Other than this he really seems to be doing great I see no major issues at this point. 06/23/2019 on evaluation today patient appears to be doing quite well with regard to his bilateral lower extremities. In fact he actually appears to be almost completely healed there is a small area of weeping noted of the right lower extremity just above the ankle. Nonetheless fortunately there is no signs of active infection at this time which is good news. No fevers, chills, nausea, vomiting, or diarrhea. 8/24; the patient arrived for a nurse visit today but complained of very significant pain in the left leg and therefore I was asked to look at this. Noted that he did not have an open area on the left leg last week nevertheless this was wrapped. The patient states that he is not been able to put his compression pumps on the left leg because of the discomfort. He has not been systemically unwell 06/30/2019 on evaluation today patient unfortunately despite  being excellent last week is doing much worse with regard to his left lower extremity today. In fact he had to come in for a nurse on Monday where his left leg had to be rewrapped due to excessive weeping Dr. Leanord Hawking placed him on doxycycline at that point. Fortunately there is no signs of active infection Systemically at this time which is good news. 07/07/2019 in regard to the patient's wounds today he actually seems to be doing well with his right lower extremity there really is nothing open or draining at this point this is great news. Unfortunately the left lower extremity is given him additional trouble at this time. There does not appear to be any signs of active infection nonetheless he does have a lot of edema and swelling noted at this point as well as blistering all of which has led to a much more poor appearing leg at this time compared to where it was 2 weeks ago when it was almost completely healed. Obviously this is a little discouraging for the patient. He is try to contact the lymphedema clinic in [place] he has not been able to get through to them. 07/14/2019 on evaluation today patient actually appears to be doing slightly better with regard to his left lower extremity ulcers. Overall I do feel like at least at the top of the wrap that we have been placing this area has healed quite nicely and looks much better. The remainder of the leg is showing signs of improvement. Unfortunately in the thigh area he still has an open region on the left and again on the right he has been utilizing just a Band-Aid on an area that also opened on the thigh. Again this is an area that were not able to wrap although we did do an Ace wrap to provide some compression that something that obviously is a little less effective than the compression wraps we have been using on the lower portion of the leg. He does have an appointment with the lymphedema clinic in [facility] on Friday. 07/21/2019 on  evaluation today patient appears to be doing better with regard to his lower extremity ulcers. He has been tolerating the dressing changes without complication. Fortunately there is no signs of active infection at this  time. No fevers, chills, nausea, vomiting, or diarrhea. I did receive the paperwork from the physical therapist at the lymphedema clinic in [place]. Subsequently I signed off on that this morning and sent that back to him for further progression with the treatment plan. 07/28/2019 on evaluation today patient appears to be doing very well with regard to his right lower extremity where I do not see any open wounds at this point. Fortunately he is feeling great as far as that is concerned as well. In regard to the left lower extremity he has been having issues with still several areas of weeping and edema although the upper leg is doing better his lower leg still I think is going require the compression wrap at this time. No fevers, chills, nausea, vomiting, or diarrhea. 08/04/2019 on evaluation today patient unfortunately is having new wounds on the right lower extremity. Again we have been using Unna boot wrap on that side. We switched him to using his juxta lite wrap at home. With that being said he tells me he has been using it although his legs extremely swollen and to be honest really does not appear that he has been. I cannot know that for sure however. Nonetheless he has multiple new wounds on the right lower extremity at this time. Obviously we will have to see about getting this rewrapped for him today. 08/11/2019 on evaluation today patient appears to be doing fairly well with regard to his wounds. He has been tolerating the dressing changes including the compression wraps without complication. He still has a lot of edema in his upper thigh regions bilaterally he is supposed to be seeing the lymphedema clinic on the 15th of this month once his wraps arrive for the upper part  of his legs. 08/18/2019 on evaluation today patient appears to be doing well with regard to his bilateral lower extremities at this point. He has been tolerating the dressing changes without complication. Fortunately there is no signs of active infection which is also good news. He does have a couple weeping areas on the first and second toe of the right foot he also has just a small area on the left foot upper leg and a small area on the left lower leg but overall he is doing quite well in my opinion. He is supposed to be getting his wraps shortly in fact tomorrow and then subsequently is seeing the lymphedema clinic next Wednesday on the 21st. Of note he is also leaving on the 25th to go on vacation [place] a [place] to the beach. For that reason and since there is some uncertainty about what there can be doing at lymphedema clinic next Wednesday I am get a make an appointment for next Friday here for [place] to see what we need to do for him prior to him leaving for vacation. 10/23; patient arrives in considerable pain predominantly in the upper posterior calf just distal to the popliteal fossa also in the wound anteriorly above the major wound. This is probably cellulitis and he has had this recurrently in the past. He has no open wound on the right side and he has had an Radio broadcast assistant in that area. Finally I note that he has an area on the left posterior calf which by enlarge is mostly epithelialized. This protrudes beyond the borders of the surrounding skin in the setting of dry scaly skin and lymphedema. The patient is leaving for [facility] on Sunday. Per his longstanding pattern, he will not take  his compression pumps with him predominantly out of fear that they will be stolen. He therefore asked that we put a Unna boot back on the right leg. He will also contact the wound care center in [facility] to see if they can change his dressing in the mid week. 11/3; patient returned from his vacation to [facility]. He was seen on 1 occasion at their wound care center. They did a 2 layer compression system as they did not have our 4-layer wrap. I am not completely certain what they put on the wounds. They did not change the Unna boot on the right. The patient is also seeing a lymphedema specialist physical therapist in [place]. It appears that he has some compression sleeve for his thighs which indeed look quite a bit better than I am used to seeing. He pumps over these with his external compression pumps. 11/10; the patient has a new wound on the right medial thigh otherwise there is no open areas on the right. He has an area on the left leg posteriorly anteriorly and medially and an area over the left second toe. We have been using silver alginate. He thinks the injury on his thigh is secondary to friction from the compression sleeve he has. 11/17; the patient has a new wound on the right medial thigh last week. He thinks this is because he did not have a underlying stocking for his thigh juxta lite apparatus. He now has this. The area is fairly large and somewhat angry but I do not think he has underlying cellulitis. He has a intact blister on the right anterior tibial area. Small wound on the right great toe dorsally Small area on the medial left calf. 11/30; the patient does not have any open areas on his right leg and we did not take his juxta lite stocking off. However he states that on Friday his compression wrap fell down lodging around his upper mid calf area. As usual this creates a lot of problems for him. He called urgently today to be seen for a nurse visit however the nurse visit turned into a provider visit because of extreme erythema and pain in the left anterior tibia extending laterally and posteriorly. The area that is problematic is extensive 10/06/2019 upon evaluation today patient actually appears to be doing poorly in regard to his left lower extremity. He Dr. Leanord Hawking did place  him on doxycycline this past Monday apparently due to the fact that he was doing much worse in regard to this left leg. Fortunately the doxycycline does seem to be helping. Unfortunately we are still having a very difficult time getting his edema under any type of control in order to anticipate discharge at some point. The only way were really able to control his lymphedema really is with compression wraps and that has only even seemingly temporary. He has been seeing a lymphedema clinic they are trying to help in this regard but still this has been somewhat frustrating in general for the patient. 10/13/19 on evaluation today patient appears to be doing excellent with regard to his right lower extremity as far as the wounds are concerned. His swelling is still quite extensive unfortunately. He is still having a lot of drainage from the thigh areas bilaterally which is unfortunate. He's been going to lymphedema clinic but again he still really does not have this edema under control as far as his lower extremities are concern. With regard to his left lower extremity this seems  to be improving and I do believe the doxycycline has been of benefit for him. He is about to complete the doxycycline. 10/20/2019 on evaluation today patient appears to be doing poorly in regard to his bilateral lower extremities. More in the right thigh he has a lot of irritation at this site unfortunately. In regard to the left lower extremity the wrap was not quite as high it appears and does seem to have caused him some trouble as well. Fortunately there is no evidence of systemic infection though he does have some blue-green drainage which has me concerned for the possibility of Pseudomonas. He tells me he is previously taking Cipro without complications and he really does not care for Levaquin however due to some of the side effects he has. He is not allergic to any medications specifically antibiotics that were aware  of. 10/27/2019 on evaluation today patient actually does appear to be for the most part doing better when compared to last week's evaluation. With that being said he still has multiple open wounds over the bilateral lower extremities. He actually forgot to start taking the Cipro and states that he still has the whole bottle. He does have several new blisters on left lower extremity today I think I would recommend he go ahead and take the Cipro based on what I am seeing at this point. 12/30-Patient comes at 1 week visit, 4 layer compression wraps on the left and Unna boot on the right, primary dressing Xtrasorb and silver alginate. Patient is taking his Cipro and has a few more days left probably 5-6, and the legs are doing better. He states he is using his compressions devices which I believe he has 11/10/2019 on evaluation today patient actually appears to be much better than last time I saw him 2 weeks ago. His wounds are significantly improved and overall I am very pleased in this regard. Fortunately there is no signs of active infection at this time. He is just a couple of days away from completing Cipro. Overall his edema is much better he has been using his lymphedema pumps which I think is also helping at this point. 11/17/2019 on evaluation today patient appears to be doing excellent in regard to his wounds in general. His legs are swollen but not nearly as much as they have been in the past. Fortunately he is tolerating the compression wraps without complication. No fevers, chills, nausea, vomiting, or diarrhea. He does have some erythema however in the distal portion of his right lower extremity specifically around the forefoot and toes there is a little bit of warmth here as well. 11/24/2019 on evaluation today patient appears to be doing well with regard to his right lower extremity I really do not see any open wounds at this point. His left lower extremity does have several open areas and his  right medial thigh also is open. Other than this however overall the patient seems to be making good progress and I am very pleased at this point. 12/01/2019 on evaluation today patient appears to be doing poorly at this point in regard to his left lower extremity has several new blisters despite the fact that we have him in compression wraps. In fact he had a 4-layer compression wrap, his upper thigh wrapped from lymphedema clinic, and a juxta light over top of the 4 layer compression wrap the lymphedema clinic applied and despite all this he still develop blisters underneath. Obviously this does have me concerned about the fact that  unfortunately despite what we are doing to try to get wounds healed he continues to have new areas arise I do not think he is ever good to be at the point where he can realistically just use wraps at home to keep things under control. Typically when we heal him it takes about 1-2 days before he is back in the clinic with severe breakdown and blistering of his lower extremities bilaterally. This is happened numerous times in the past. Unfortunately I think that we may need some help as far as overall fluid overload to kind of limit what we are seeing and get things under better control. 12/08/2019 on evaluation today patient presents for follow-up concerning his ongoing bilateral lower extremity edema. Unfortunately he is still having quite a bit of swelling the compression wraps are controlling this to some degree but he did see Dr. [name] his cardiologist I do have that available for review today as far as the appointment was concerned that was on 12/06/2019. Obviously that she has been 2 days ago. The patient states that he is only been taking the Lasix 80 mg 1 time a day he had told me previously he was taking this twice a day. Nonetheless Dr. [name] recommended this be up to 80 mg 2 times a day for the patient as he did appear to be fluid overloaded. With that being said the  patient states he did this yesterday and he was unable to go anywhere or do anything due to the fact that he was constantly having to urinate. Nonetheless I think that this is still good to be something that is important for him as far as trying to get his edema under control at all things that he is going to be able to just expect his wounds to get under control and things to be better without going through at least a period of time where he is trying to stabilize his fluid management in general and I think increasing the Lasix is likely the first step here. It was also mentioned the possibility that the patient may require metolazone. With that being said he wanted to have the patient take Lasix twice a day first and then reevaluating 2 months to see where things stand. 12/15/2019 upon evaluation today patient appears to be doing regard to his legs although his toes are showing some signs of weeping especially on the left at this point to some degree on the right. There does not appear to be any signs of active infection and overall I do feel like the compression wraps are doing well for him but he has not been able to take the Lasix at home and the increased dose that Dr. [name] recommended. He tells me that just not go to be feasible for him. Nonetheless I think in this case he should probably send a message to Dr. [name] in order to discuss options from the standpoint of possible admission to get the fluid off or otherwise going forward. 12/22/2019 upon evaluation today patient appears to be doing fairly well with regard to his lower extremities at this point. In fact he would be doing excellent if it was not for the fact that his right anterior thigh apparently had an allergic reaction to adhesive tape that he used. The wound itself that we have been monitoring actually appears to be healed. There is a lot of irritation at this point. 12/29/2019 upon evaluation today patient appears to be doing well  in regard to his  lower extremities. His left medial thigh is open and somewhat draining today but this is the only region that is open the right has done much better with the treatment utilizing the steroid cream that I prescribed for him last week. Overall I am pleased in that regard. Fortunately there is no signs of active infection at this time. No fevers, chills, nausea, vomiting, or diarrhea. 01/05/2020 upon evaluation today patient appears to be doing more poorly in regard to his right lower extremity at this point upon evaluation today. Unfortunately he continues to have issues in this regard and I think the biggest issue is controlling his edema. This obviously is not very well controlled at this point is been recommended that he use the Lasix twice a day but he has not been able to do that. Unfortunately I think this is leading to an issue where honestly he is not really able to effectively control his edema and therefore the wounds really are not doing significantly better. I do not think that he is going to be able to keep things under good control unless he is able to control his edema much better. I discussed this again in great detail with him today. 01/12/2020 good news is patient actually appears to be doing quite well today at this point. He does have an appointment with lymphedema clinic tomorrow. His legs appear healed and the toe on the left is almost completely healed. In general I am very pleased with how things stand at this point. 01/19/2020 upon evaluation today patient appears to actually be doing well in regard to his lower extremities there is nothing open at this point. Fortunately he has done extremely well more recently. Has been seeing lymphedema clinic as well. With that being said he has Velcro wraps for his lower legs as well as his upper legs. The only wound really is on his toe which is the right great toe and this is barely anything even there. With all that being said I  think it is good to be appropriate today to go ahead and switch him over to the Velcro compression wraps. 01/26/2020 upon evaluation today patient appears to be doing worse with regard to his lower extremities after last week switch him to Velcro compression wraps. Unfortunately he lasted less than 24 hours he did not have the sock portion of his Velcro wrap on the left leg and subsequently developed a blister underneath the Velcro portion. Obviously this is not good and not what we were looking for at this point. He states the lymphedema clinic did tell him to wear the wrap for 23 hours and take him off for 1 I am okay with that plan but again right now we got a get things back under control again he may have some cellulitis noted as well. 02/02/2020 upon evaluation today patient unfortunately appears to have several areas of blistering on his bilateral lower extremities today mainly on the feet. His legs do seem to be doing somewhat better which is good news. Fortunately there is no evidence of active infection at this time. No fevers, chills, nausea, vomiting, or diarrhea. 02/16/2020 upon evaluation today patient appears to be doing well at this time with regard to his legs. He has a couple weeping areas on his toes but for the most part everything is doing better and does appear to be sealed up on his legs which is excellent news. We can continue with wrapping him at this point as he had every  time we discontinue the wraps he just breaks out with new wounds. There is really no point in is going forward with this at this point. 03/08/2020 upon evaluation today patient actually appears to be doing quite well with regard to his lower extremity ulcers. He has just a very superficial and really almost nonexistent blister on the left lower extremity he has in general done very well with the compression wraps. With that being said I do not see any signs of infection at this time which is good news. 03/29/2020  upon evaluation today patient appears to be doing well with regard to his wounds currently except for where he had several new areas that opened up due to some of the wrap slipping and causing him trouble. He states he did not realize they had slipped. Nonetheless he has a 1 area on the right and 3 new areas on the left. Fortunately there is no signs of active infection at this time which is great news. 04/05/2020 upon evaluation today patient actually appears to be doing quite well in general in regard to his legs currently. Fortunately there is no signs of active infection at this time. No fevers, chills, nausea, vomiting, or diarrhea. He tells me next week that he will actually be seen in the lymphedema clinic on Thursday at 10 AM I see him on Wednesday next week. 04/12/2020 upon evaluation today patient appears to be doing very well with regard to his lower extremities bilaterally. In fact he does not appear to have any open wounds at this point which is good news. Fortunately there is no signs of active infection at this time. No fevers, chills, nausea, vomiting, or diarrhea. 04/19/2020 upon evaluation today patient appears to be doing well with regard to his wounds currently on the bilateral lower extremities. There does not appear to be any signs of active infection at this time. Fortunately there is no evidence of systemic infection and overall very pleased at this point. Nonetheless after I held him out last week he literally had blisters the next morning already which swelled up with him being right back here in the clinic. Overall I think that he is just not can be able to be discharged with his legs the way they are he is much to volume overloaded as far as fluid is concerned and that was discussed with him today of also discussed this but should try the clinic nurse manager as well as Dr. Leanord Hawking. 04/26/2020 upon evaluation today patient appears to be doing better with regard to his wounds  currently. He is making some progress and overall swelling is under good control with the compression wraps. Fortunately there is no evidence of active infection at this time. 05/10/2020 on evaluation today patient appears to be doing overall well in regard to his lower extremities bilaterally. He is Tolerating the compression wraps without complication and with what we are seeing currently I feel like that he is making excellent progress. There is no signs of active infection at this time. 05/24/2020 upon evaluation today patient appears to be doing well in regard to his legs. The swelling is actually quite a bit down compared to where it has been in the past. Fortunately there is no sign of active infection at this time which is also good news. With that being said he does have several wounds on his toes that have opened up at this point. 05/31/2020 upon evaluation today patient appears to be doing well with regard to his  legs bilaterally where he really has no significant fluid buildup at this point overall he seems to be doing quite well. Very pleased in this regard. With regard to his toes these also seem to be drying up which is excellent. We have continue to wrap him as every time we tried as a transition to the juxta light wraps things just do not seem to get any better. 06/07/2020 upon evaluation today patient appears to be doing well with regard to his right leg at this point. Unfortunately left leg has a lot of blistering he tells me the wrap started to slide down on him when he tried to put his other Velcro wrap over top of it to help keep things in order but nonetheless still had some issues. 06/14/2020 on evaluation today patient appears to be doing well with regard to his lower extremity ulcers and foot ulcers at this point. I feel like everything is actually showing signs of improvement which is great news overall there is no signs of active infection at this time. No fevers, chills, nausea,  vomiting, or diarrhea. 06/21/2020 on evaluation today patient actually appears to be doing okay in regard to his wounds in general. With that being said the biggest issue I see is on his right foot in particular the first and second toe seem to be doing a little worse due to the fact this is staying very wet. I think he is probably getting need to change out his dressings a couple times in between each week when we see him in regard to his toes in order to keep this drier based on the location and how this is proceeding. 06/28/2020 on evaluation today patient appears to be doing a little bit more poorly overall in regard to the appearance of the skin I am actually somewhat concerned about the possibility of him having a little bit of an infection here. We discussed the course of potentially giving him a doxycycline prescription which he is taken previously with good result. With that being said I do believe that this is potentially mild and at this point easily fixed. I just do not want anything to get any worse. 07/12/2020 upon evaluation today patient actually appears to be making some progress with regard to his legs which is great news there does not appear to be any evidence of active infection. Overall very pleased with where things stand. 07/26/2020 upon evaluation today patient appears to be doing well with regard to his leg ulcers and toe ulcers at this point. He has been tolerating the compression wraps without complication overall very pleased in this regard. 08/09/2020 upon evaluation today patient appears to be doing well with regard to his lower extremities bilaterally. Fortunately there is no signs of active infection overall I am pleased with where things stand. 08/23/2020 on evaluation today patient appears to be doing well with regard to his wound. He has been tolerating the dressing changes without complication. Fortunately there is no signs of active infection at this time. Overall his  legs seem to be doing quite well which is great news and I am very pleased in that regard. No fevers, chills, nausea, vomiting, or diarrhea. 09/13/2020 upon evaluation today patient appears to be doing okay in regard to his lower extremities. He does have a fairly large blister on the right leg which I did remove the blister tissue from today so we can get this to dry out other than that however he seems to be doing  quite well. There is no signs of active infection at this time. 09/27/2020 upon evaluation today patient appears to actually be doing some better in regard to his right leg. Fortunately signs of active infection at this time which is great news. No fevers, chills, nausea, vomiting, or diarrhea. 10/04/2020 upon evaluation today patient actually appears to be showing signs of improvement which is great news with regard to his leg ulcers. Fortunately there is no signs of active infection which is great news he is still taking the antibiotics currently. No fevers, chills, nausea, vomiting, or diarrhea. 10/18/2020 on evaluation today patient appears to be doing well with regard to his legs currently. He has been tolerating the dressing changes including the wraps without complication. Fortunately there is no signs of active infection at this time. No fevers, chills, nausea, vomiting, or diarrhea. 10/25/2020 upon evaluation today patient appears to be doing decently well in regard to his wounds currently. He has been tolerating the dressing changes without complication. Overall I feel like he is making good progress albeit slow. Again this is something we can have to continue to wrap for some time to come most likely. 11/08/2020 upon evaluation today patient appears to be doing well with regard to his wounds currently. He has been tolerating the dressing changes without complication is not currently on any antibiotics and he does not appear to show any signs of infection. He does continue to have a  lot of drainage on the right leg not too severe but nonetheless this is very scattered. On the left leg this is looking to be much improved overall. 11/15/2020 upon evaluation today patient appears to be doing better with regard to his legs bilaterally. Especially the right leg which was much more significant last week. There does not appear to be any signs of active infection which is great news. No fevers, chills, nausea, vomiting, or diarrhea. 11/23/2019 upon evaluation today patient appears to be doing poorly still in regard to his lower extremities bilaterally. Unfortunately his right leg in particular appears to be doing much more poorly there is no signs really of infection this is not warm to touch but he does have a lot of drainage and weeping unfortunately. With that reason I do believe that we may need to initiate some treatment here to try to help calm down some of the swelling of the right leg. I think switching to a 4-layer compression wrap would be beneficial here. The patient is in agreement with giving this a try. 11/29/2020 upon evaluation today patient appears to be doing well currently in regard to his leg ulcers. I feel like the right leg is doing better he still has a lot of drainage but we do see some improvement here. The 4-layer compression wrap I think was helpful. 12/06/2020 upon evaluation today patient appears to be doing well with regard to his legs. In fact they seem to be doing about the best I have seen up to this point. Fortunately there is no signs of active infection at this time. No fevers, chills, nausea, vomiting, or diarrhea. 12/20/2020 upon evaluation today patient appears to be doing well at this time in regard to his legs. He is not having any significant draining which is great news. Fortunately there is no signs of active infection at this time. No fevers, chills, nausea, vomiting, or diarrhea. 01/17/2021 upon evaluation today Jasin actually appears to be doing  excellent in regard to his legs. He has a few areas again that  come and go as far as his toes are concerned but overall this is doing quite well. 01/31/2021 upon evaluation today patient appears to be doing well with regard to his legs. Fortunately there does not appear to be any signs of active infection which is great news. Overall he is still having significant edema despite the compression wraps basically the 4-layer compression wrap to just keep things under control there is really not much room for play. 4/13: Mr. Mckinnon is a longstanding patient in our clinic and benefits greatly from weekly compression wraps. Today he has no complaints. He has been tolerating the wraps well. He states he is using the lymphedema pumps at home. 5/4; patient presents for follow-up of his chronic lymphedema/venous insufficiency ulcers. He comes weekly for compression wraps. He has no complaints today. He was unable to tolerate the Coflex 2 layer Last week so we will do the four press 4-layer compression. He has been using his lymphedema pumps daily. 5/18; patient presents for 2-week follow-up. He has no complaints or issues today. He has developed a new wound to the right foot on his fourth toe. He overall feels well and denies signs of infection. 6/1; patient presents for 2-week follow-up. He has no complaints or issues today. He denies signs of infection. 04/18/2021 upon evaluation today patient appears to be doing well with regard to his legs bilaterally. Family open wound is actually on the toe of his left foot everything else is completely closed which is great news. In general I am extremely pleased with where things stand at this point. The patient is also happy that things are doing so well. 05/02/2021 upon evaluation today patient's legs actually appear to be doing quite well today. Fortunately there does not appear to be any signs of active infection which is great and overall I am extremely pleased with  where he stands today. The patient does not appear to have any evidence of active infection at this time which is also great news. 05/09/2021 upon evaluation today patient appears to be doing a little bit more poorly in regard to his legs. Unfortunately he is having issues with some breakdown and a blood blister on the left leg this is due to I believe honestly to how it was wrapped last week. Fortunately there does not appear to be any signs of infection but nonetheless this is still a concern to be honest. No fevers, chills, nausea, vomiting, or diarrhea. 05/16/2021 upon evaluation today patient appears to be doing significantly better as compared to last week. I am very pleased with where things stand today. There does not appear to be any signs of infection which is great news and overall very pleased with where we stand. No fevers, chills, nausea, vomiting, or diarrhea. 05/30/2021 upon evaluation today patient appears to be doing well with regard to his legs. He has been tolerating the dressing changes without complication. Fortunately there does not appear to be any signs of active infection which is great news and overall I am extremely pleased with where things stand today. No fevers, chills, nausea, vomiting, or diarrhea. 06/20/2021 upon evaluation today patient actually appears to be making good progress today and very pleased with what we are seeing. I think his legs are really maintaining. As long as we continue wrapping he seems to be doing excellent in my opinion. Fortunately there is no signs of active infection at this time. No fevers, chills, nausea, vomiting, or diarrhea. 07/11/2021 upon evaluation today patient actually appears  to be making excellent progress at this time. Fortunately there does not appear to be any evidence of active infection which is great news and overall I am extremely pleased with where things stand today. No fevers, chills, nausea, vomiting, or diarrhea. 07/25/2021  upon evaluation today patient appears to be doing well currently in regard to his lower extremities. He has been making good progress here and I do not see anything that is actually open significantly today this is great news. No fevers, chills, nausea, vomiting, or diarrhea. 08/08/2021 upon evaluation today patient appears to be doing well with regard to his wound. He has been tolerating the dressing changes without complication. With that being said unfortunately has a new area that opened up as far as his right posterior leg is concerned this was a blister he also has an area on the third toe right foot which also reopen. Fortunately there is no signs of active infection at this time which is great news. No fevers, chills, nausea, vomiting, or diarrhea. 10/17; patient came in today at his request initially for a nurse visit because it but out of concern for deterioration in both his lower legs and cellulitis I was asked to look at him. He comes in with increased swelling which he says started over the weekend he started to notice pain as well in his left medial ankle, right knee, left knee left dorsal foot. His wraps fell down contributing to some of this but he has not been using his compression pumps over the weekend for reasons that are not really clear. He comes in with multiple new wounds including the right posterior leg, right third toe, right fourth toe, left second toe left medial ankle left dorsal ankle and right anterior lower leg 09/19/2021 upon evaluation today patient does seem to be making improvements in general which is great news. I do not see any evidence of infection currently he does have some hypergranulation of the anterior portion of his ankle on the left side this is going require some debridement to pare this down and then subsequently silver nitrate probably due to the amount of bleeding that he is probably going to experience. He is in agreement with this plan  however. 09/26/2021 upon evaluation today although the patient's legs appear to be doing okay today unfortunately he did have maggots noted during the evaluation as well. This is again quite unfortunate with the respect to the fact that he feels like that he got the wrap wet which was noted today he is not sure when and I think this is what led to the issue. Nonetheless we can try to see what we can do about silly things off so that this does not happen again in the future although he is can have to be diligent about taking care of his wraps as well. 10/03/2021 upon evaluation today patient appears to be doing okay in regard to his legs currently. He unfortunately has maggots again noted on the left foot. This obviously is becoming quite an issue to be perfectly honest. I do think that he needs to see what can be done at home to try to limit this exposure. Last week we had cleaned everything away so this is a new infestation as far as that is concerned. There is really not much that I can do to combat that I am trying to do what we can here in the clinic to wrap his foot and try to prevent any access of that again  with knots there is a small this becomes very difficult. 10/10/2021 upon evaluation today patient appears to be doing poorly in regard to his legs he is very swollen and unfortunately I think a big part of the issue here is simply that he is not taking his fluid pills appropriately stressed probably is not helping much at all either. He still having a lot of issues as far as getting moved and having to be out of his current living condition. With that being said he does have of note maggots noted on the right foot this time completely separate from what we have been seeing he tells me that he got his wraps wet again. Its mainly when it rains it sounds like that he goes out to his car in the area where he has to park there is a area that collects a fairly large puddle according to what he tells  me today either way I explained to him which we have done before as well that if he gets his wraps wet he needs to let us know so we can get them changed out. He does not need to keep them on wet. 10/17/2021 upon evaluation today patient appears to be doing well in regard to his legs. In fact things are significantly better compared to where they were previous. Fortunately I see no evidence of infection currently which is great news. No fevers, chills, nausea, vomiting, or diarrhea. 10/24/2021 upon evaluation today patient appears to be doing awesome in regard to his legs in general. I think everything is showing signs of significant improvement which is great news. There is really only 1 opening on the second toe left foot everything else is closed. 11/07/2020 upon evaluation today patient appears to be doing well with regard to his legs in general he does have a blister on the left foot but otherwise things are doing overall quite well. 11/28/2021 upon evaluation today patient's legs appear to be doing decently well. The biggest issue we see is that he does have some blistering where the wrap seems to have slid down on the right leg near the top. This is just below the knee. That is good to be the biggest issue going forward at this point in my opinion. Otherwise I really feel like he is doing pretty well across the board with the other wound locations previously noted. 12/05/2021 upon evaluation today patient appears to be doing well with regard to the majority of his wounds in fact he has 1 area healed. Unfortunately he has a new area that broken down over the anterior portion of his left ankle. This is quite deep and draining quite a bit. Fortunately I do not see any signs of infection here though on the lateral portion of his ankle there is some erythema and warmth to touch no drainage nothing really to culture but I am concerned about cellulitis starting up here. 12/12/2021 upon evaluation today  patient appears to be doing significantly better in regard to his leg ulcers. Everything is improved as compared to the last time I saw him. I am actually very pleased with where we stand today. The patient is not having any significant pain which is great news. 12/26/2021 upon evaluation patient appears to be doing a little bit more poorly in regard to the wound on his second toe left foot. Unfortunately with the wrap we put on he collect a lot of fluid underneath the underside of the toe and this has caused this to  breakdown to some degree. Fortunately I do not see any evidence of active infection locally or systemically which is great news. No fevers, chills, nausea, vomiting, or diarrhea. 01/02/2022 upon evaluation today patient appears to be doing well with regard to his wound. He has been tolerating the dressing changes I do feel like the toe as well as the ankle area anteriorly on the left leg is doing much better. I am very pleased in that regard. 01/09/2022 upon evaluation today patient appears to be doing well with regard to his wounds for the most part the area underneath his toe is the one area on the left foot that has been most concerned. Everything else is showing signs of excellent improvement. I do not see any evidence of active infection locally or systemically which is great news and overall I think that we are headed in the right direction here to be honest. Electronic Signature(s) Signed: 01/09/2022 11:32:43 AM By: Lenda Kelp PA-C Entered By: Lenda Kelp on 01/09/2022 11:32:43 -------------------------------------------------------------------------------- Physical Exam Details Patient Name: Date of Service: [name], [name] 01/09/2022 10:30 A M Medical Record Number: [id number] Patient Account Number: [id number] Date of Birth/Sex: Treating RN: 1951/01/04 (71 y.o. M) Primary Care Provider: Nicoletta Ba Other Clinician: Referring Provider: Treating Provider/Extender: Adele Dan in Treatment: 311 Constitutional Obese and well-hydrated in no acute distress. Respiratory labored, rapid respiration. Cardiovascular 2+ dorsalis pedis/posterior tibialis pulses. Psychiatric this patient is able to make decisions and demonstrates good insight into disease process. Alert and Oriented x 3. pleasant and cooperative. Notes Upon inspection patient's wound appears to be doing well unfortunately his breathing is not doing nearly as well. He is very labored with regard to respirations and that does have me concerned to be perfectly honest. I did discuss with him today that he may need to go to the ER for further evaluation and treatment. I feel like he may be fluid overloaded which is a big portion of the issue here as well. Nonetheless he does not seem very likely that he is going to go but I really feel like this would be ideal to be honest. Electronic Signature(s) Signed: 01/09/2022 11:33:17 AM By: Lenda Kelp PA-C Entered By: Lenda Kelp on 01/09/2022 11:33:17 -------------------------------------------------------------------------------- Physician Orders Details Patient Name: Date of Service: [name], Pink J. 01/09/2022 10:30 A M Medical Record Number: [id number] Patient Account Number: [id number] Date of Birth/Sex: Treating RN: 07-07-1951 (71 y.o. [name], [name] Primary Care Provider: Nicoletta Ba Other Clinician: Referring Provider: Treating Provider/Extender: Adele Dan in Treatment: [phone] Verbal / Phone Orders: No Diagnosis Coding ICD-10 Coding Code Description L97.521 Non-pressure chronic ulcer of other part of left foot limited to breakdown of skin L97.829 Non-pressure chronic ulcer of other part of left lower leg with unspecified severity L97.519 Non-pressure chronic ulcer of other part of right foot with unspecified severity L97.812 Non-pressure chronic ulcer of other part of right lower  leg with fat layer exposed I87.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 - Wednesday Dr. Lady Gary and Yvonne Kendall Return A Other: - ***Advise you to go to the emergency department related to labored breathing with possible fluid overload.*** ***Ensure to follow up with  Cardiology as well related to labored breathing and fluid overload.** Patient to follow up with Dr. Marlowe Sax at [facility] regarding weight loss. Bathing/ Shower/ Hygiene May shower with protection but do not get wound dressing(s) wet. Edema Control - Lymphedema / SCD / Other Bilateral Lower Extremities Lymphedema Pumps. Use Lymphedema pumps on leg(s) 2-3 times a day for 45-60 minutes. If wearing any wraps or hose, do not remove them. Continue exercising as instructed. Elevate legs to the level of the heart or above for 30 minutes daily and/or when sitting, a frequency of: - throughout the day Avoid standing for long periods of time. Exercise regularly Other Edema Control Orders/Instructions: - right leg 4 layer compression with unna boot to secure to upper portion of lower leg. apply lotion to right leg. Wound Treatment Wound #204 - T Second oe Wound Laterality: Left Peri-Wound Care: Zinc Oxide Ointment 30g tube 1 x Per Week/30 Days Discharge Instructions: Apply Zinc Oxide to periwound with each dressing change Secondary Dressing: Woven Gauze Sponges 2x2 in 1 x Per Week/30 Days Discharge Instructions: Apply over primary dressing as directed. Wound #211 - Ankle Wound Laterality: Left, Anterior Peri-Wound Care: Sween Lotion (Moisturizing lotion) 1 x Per Week/30 Days Discharge  Instructions: Apply moisturizing lotion as directed Prim Dressing: KerraCel Ag Gelling Fiber Dressing, 2x2 in (silver alginate) 1 x Per Week/30 Days ary Discharge Instructions: Apply silver alginate to wound bed as instructed Secondary Dressing: Woven Gauze Sponge, Non-Sterile 4x4 in 1 x Per Week/30 Days Discharge Instructions: Apply over primary dressing as directed. Secondary Dressing: ABD Pad, 8x10 1 x Per Week/30 Days Discharge Instructions: Apply over primary dressing as directed. Compression Wrap: FourPress (4 layer compression wrap) 1 x Per Week/30 Days Discharge Instructions: Apply four layer compression . unna layer at top to secure wrap Wound #214 - T Second oe Wound Laterality: Left, Posterior Cleanser: Soap and Water 1 x Per Day/30 Days Discharge Instructions: May shower and wash wound with dial antibacterial soap and water prior to dressing change. Peri-Wound Care: Zinc Oxide Ointment 30g tube 1 x Per Day/30 Days Discharge Instructions: Apply Zinc Oxide to periwound with each dressing change Secondary Dressing: Woven Gauze Sponges 2x2 in 1 x Per Day/30 Days Discharge Instructions: Apply 2x2 gauze rolled over zinc oxide Wound #215 - T Fourth oe Wound Laterality: Left Cleanser: Soap and Water 1 x Per Day/30 Days Discharge Instructions: May shower and wash wound with dial antibacterial soap and water prior to dressing change. Peri-Wound Care: Zinc Oxide Ointment 30g tube 1 x Per Day/30 Days Discharge Instructions: Apply Zinc Oxide to periwound with each dressing change Secondary Dressing: Woven Gauze Sponges 2x2 in 1 x Per Day/30 Days Discharge Instructions: Apply 2x2 gauze rolled over zinc oxide Electronic Signature(s) Signed: 01/09/2022 3:53:22 PM By: Lenda Kelp PA-C Signed: 01/09/2022 4:44:26 PM By: Shawn Stall RN, BSN Entered By: Shawn Stall on 01/09/2022 11:22:35 -------------------------------------------------------------------------------- Problem List  Details Patient Name: Date of Service: [name], [name] 01/09/2022 10:30 A M Medical Record Number: [id number] Patient Account Number: [id number] Date of Birth/Sex: Treating RN: 08-Apr-1951 (71 y.o. 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 04/27/2018 No Yes skin L97.829 Non-pressure chronic ulcer of other part of left lower leg with unspecified 03/21/2021 No Yes severity L97.519 Non-pressure chronic ulcer of  other part of right foot with unspecified severity 03/21/2021 No Yes L97.812 Non-pressure chronic ulcer of other part of right lower leg with fat layer 08/08/2021 No Yes exposed I87.333 Chronic venous hypertension (idiopathic) with ulcer and inflammation of 01/22/2016 No Yes bilateral lower extremity L03.116 Cellulitis of left lower limb 08/20/2021 No Yes I89.0 Lymphedema, not elsewhere classified 01/22/2016 No Yes E11.40 Type 2 diabetes mellitus with diabetic neuropathy, unspecified 01/22/2016 No Yes E11.622 Type 2 diabetes mellitus with other skin ulcer 01/22/2016 No Yes L03.115 Cellulitis of right lower limb 12/22/2017 No Yes Inactive Problems ICD-10 Code Description Active Date Inactive Date 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 06/30/2018 06/30/2018 L97.511 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 of skin 06/30/2018 06/30/2018 Resolved Problems ICD-10 Code Description Active Date Resolved Date L97.211 Non-pressure chronic ulcer of right calf limited to breakdown of skin 06/30/2018 06/30/2018 L97.221 Non-pressure chronic ulcer of left calf limited to breakdown of skin 09/30/2016 09/30/2016 L03.116 Cellulitis of left lower limb  04/01/2017 04/01/2017 L97.211 Non-pressure chronic ulcer of right calf limited to breakdown of skin 06/30/2017 06/30/2017 Electronic Signature(s) Signed: 01/09/2022 10:46:13 AM By: Lenda Kelp PA-C Entered By: Lenda Kelp on 01/09/2022 10:46:12 -------------------------------------------------------------------------------- Progress Note Details Patient Name: Date of Service: [name], Delaine J. 01/09/2022 10:30 A M Medical Record Number: [id number] Patient Account Number: [id number] Date of Birth/Sex: Treating RN: [birth year] (71 y.o. M) Primary Care Provider: Nicoletta Ba Other Clinician: Referring Provider: Treating Provider/Extender: Adele Dan in Treatment: 311 Subjective Chief Complaint Information obtained from Patient Patient is here for evaluation of venous/lymphedema ulcers History of Present Illness (HPI) Referred by PCP for consultation. Patient has long standing history of BLE venous stasis, no prior ulcerations. At beginning of month, developed cellulitis and weeping. Received IM Rocephin followed by Keflex and resolved. Wears compression stocking, appr 6 months old. Not sure strength. No present drainage. 01/22/16 this is a patient who is a type II diabetic on insulin. 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 He tells me he religiously wears pressure stockings of uncertain strength. He was here with weeping edema about 8 months ago but did not have an open wound. Roughly a month ago he had a reopening on his bilateral legs. He is been using bandages and Neosporin. He does not complain of pain. 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 Last BUN/creatinine I have is from 11/20/15 at 13 and 1.0 respectively 01/29/16; patient arrives today having tolerated the Profore wrap. He brought in his stockings and these  are 18 mmHg stockings he bought from [employer]. The compression here is likely inadequate. He does not complain of pain or excessive drainage she has no systemic symptoms. The wound on the right looks improved as does the one on the left although one on the left is more substantial with still tissue at risk below the actual wound area on the bilateral posterior calf 02/05/16; patient arrives with poor edema control. He states that we did put a 4 layer compression on it last week. No weight appear 5 this. 02/12/16; the area on the posterior right Has healed. The left Has a substantial wound that has necrotic surface eschar that requires a debridement with a curette. 02/16/16;the patient called or a Nurse visit secondary to increased swelling. He had been in earlier in  the week with his right leg healed. He was transitioned to is on pressure stocking on the right leg with the only open wound on the left, a substantial area on the left posterior calf. Note he has a history of severe lower extremity edema, he has a history of chronic atrial fibrillation but not heart failure per my notes but I'll need to research this. He is not complaining of chest pain shortness of breath or orthopnea. The intake nurse noted blisters on the previously closed right leg 02/19/16; this is the patient's regular visit day. I see him on Friday with escalating edema new wounds on the right leg and clear signs of at least right ventricular heart failure. I increased his Lasix to 40 twice a day. He is returning currently in follow-up. States he is noticed a decrease in that the edema 02/26/16 patient's legs have much less edema. There is nothing really open on the right leg. The left leg has improved condition of the large superficial wound on the posterior left leg 03/04/16; edema control is very much better. The patient's right leg wounds have healed. On the left leg he continues to have severe venous inflammation on the posterior  aspect of the left leg. There is no tenderness and I don't think any of this is cellulitis. 03/11/16; patient's right leg is married healed and he is in his own stocking. The patient's left leg has deteriorated somewhat. There is a lot of erythema around the wound on the posterior left leg. There is also a significant rim of erythema posteriorly just above where the wrap would've ended there is a new wound in this location and a lot of tenderness. Can't rule out cellulitis in this area. 03/15/16; patient's right leg remains healed and he is in his own stocking. The patient's left leg is much better than last review. His major wound on the posterior aspect of his left Is almost fully epithelialized. He has 3 small injuries from the wraps. Really. Erythema seems a lot better on antibiotics 03/18/16; right leg remains healed and he is in his own stocking. The patient's left leg is much better. The area on the posterior aspect of the left calf is fully epithelialized. His 3 small injuries which were wrap injuries on the left are improved only one seems still open his erythema has resolved 03/25/16; patient's right leg remains healed and he is in his own stocking. There is no open area today on the left leg posterior leg is completely closed up. His wrap injuries at the superior aspect of his leg are also resolved. He looks as though he has some irritation on the dorsal ankle but this is fully epithelialized without evidence of infection. 03/28/16; we discharged this patient on Monday. Transitioned him into his own stocking. There were problems almost immediately with uncontrolled swelling weeping edema multiple some of which have opened. He does not feel systemically unwell in particular no chest pain no shortness of breath and he does not feel 04/08/16; the edema is under better control with the Profore light wrap but he still has pitting edema. There is one large wound anteriorly 2 on the medial aspect of his  left leg and 3 small areas on the superior posterior calf. Drainage is not excessive he is tolerating a Profore light well 04/15/16; put a Profore wrap on him last week. This is controlled is edema however he had a lot of pain on his left anterior foot most of his wounds are  healed 04/22/16 once again the patient has denuded areas on the left anterior foot which he states are because his wrap slips up word. He saw his primary physician today is on Lasix 40 twice a day and states that he his weight is down 20 pounds over the last 3 months. 04/29/16: Much improved. left anterior foot much improved. He is now on Lasix 80 mg per day. Much improved edema control 05/06/16; I was hoping to be able to discharge him today however once again he has blisters at a low level of where the compression was placed last week mostly on his left lateral but also his left medial leg and a small area on the anterior part of the left foot. 05/09/16; apparently the patient went home after his appointment on 7/4 later in the evening developing pain in his upper medial thigh together with subjective fever and chills although his temperature was not taken. The pain was so intense he felt he would probably have to call 911. However he then remembered that he had leftover doxycycline from a previous round of antibiotics and took these. By the next morning he felt a lot better. He called and spoke to one of our nurses and I approved doxycycline over the phone thinking that this was in relation to the wounds we had previously seen although they were definitely were not. The patient feels a lot better old fever no chills he is still working. Blood sugars are reasonably controlled 05/13/16; patient is back in for review of his cellulitis on his anterior medial upper thigh. He is taking doxycycline this is a lot better. Culture I did of the nodular area on the dorsal aspect of his foot grew MRSA this also looks a lot better. 05/20/16; the  patient is cellulitis on the medial upper thigh has resolved. All of his wound areas including the left anterior foot, areas on the medial aspect of the left calf and the lateral aspect of the calf at all resolved. He has a new blister on the left dorsal foot at the level of the fourth toe this was excised. No evidence of infection 05/27/16; patient continues to complain weeping edema. He has new blisterlike wounds on the left anterior lateral and posterior lateral calf at the top of his wrap levels. The area on his left anterior foot appears better. He is not complaining of fever, pain or pruritus in his feet. 05/30/16; the patient's blisters on his left anterior leg posterior calf all look improved. He did not increase the Lasix 100 mg as I suggested because he was going to run out of his 40 mg tablets. He is still having weeping edema of his toes 06/03/16; I renewed his Lasix at 80 mg once a day as he was about to run out when I last saw him. He is on 80 mg of Lasix now. I have asked him to cut down on the excessive amount of water he was drinking and asked him to drink according to his thirst mechanisms 06/12/2016 -- was seen 2 days ago and was supposed to wear his compression stockings at home but he is developed lymphedema and superficial blisters on the left lower extremity and hence came in for a review 06/24/16; the remaining wound is on his left anterior leg. He still has edema coming from between his toes. There is lymphedema here however his edema is generally better than when I last saw this. He has a history of atrial fibrillation but does  not have a known history of congestive heart failure nevertheless I think he probably has this at least on a diastolic basis. 07/01/16 I reviewed his echocardiogram from January 2017. This was essentially normal. He did not have LVH, EF of 55-60%. His right ventricular function was normal although he did have trivial tricuspid and pulmonic regurgitation.  This is not audible on exam however. I increased his Lasix to do massive edema in his legs well above his knees I think in early July. He was also drinking an excessive amount of water at the time. 07/15/16; missed his appointment last week because of the Labor Day holiday on Monday. He could not get another appointment later in the week. Started to feel the wrap digging in superiorly so we remove the top half and the bottom half of his wrap. He has extensive erythema and blistering superiorly in the left leg. Very tender. Very swollen. Edema in his foot with leaking edema fluid. He has not been systemically unwell 07/22/16; the area on the left leg laterally required some debridement. The medial wounds look more stable. His wrap injury wounds appear to have healed. Edema and his foot is better, weeping edema is also better. He tells me he is meeting with the supplier of the external compression pumps at work 08/05/16; the patient was on vacation last week in [facility]. His wrap is been on for an extended period of time. Also over the weekend he developed an extensive area of tender erythema across his anterior medial thigh. He took to doxycycline yesterday that he had leftover from a previous prescription. The patient complains of weeping edema coming out of his toes 08/08/16; I saw this patient on 10/2. He was tender across his anterior thigh. I put him on doxycycline. He returns today in follow-up. He does not have any open wounds on his lower leg, he still has edema weeping into his toes. 08/12/16; patient was seen back urgently today to follow-up for his extensive left thigh cellulitis/erysipelas. He comes back with a lot less swelling and erythema pain is much better. I believe I gave him Augmentin and Cipro. His wrap was cut down as he stated a roll down his legs. He developed blistering above the level of the wrap that remained. He has 2 open blisters and 1 intact. 08/19/16; patient is been  doing his primary doctor who is increased his Lasix from 40-80 once a day or 80 already has less edema. Cellulitis has remained improved in the left thigh. 2 open areas on the posterior left calf 08/26/16; he returns today having new open blisters on the anterior part of his left leg. He has his compression pumps but is not yet been shown how to use some vital representative from the supplier. 09/02/16 patient returns today with no open wounds on the left leg. Some maceration in his plantar toes 09/10/2016 -- Dr. Leanord Hawking had recently discharged him on 09/02/2016 and he has come right back with redness swelling and some open ulcers on his left lower extremity. He says this was caused by trying to apply his compression stockings and he's been unable to use this and has not been able to use his lymphedema pumps. He had some doxycycline leftover and he has started on this a few days ago. 09/16/16; there are no open wounds on his leg on the left and no evidence of cellulitis. 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 He does  not complain of symptoms here. I am not clear use using his external compression pumps. 09/23/16; I have not seen this patient in 2 weeks. He canceled his appointment 10 days ago as he was going on vacation. He tells me that on Monday he noticed a large area on his posterior left leg which is been draining copiously and is reopened into a large wound. He is been using ABDs and the external part of his juxtalite, according to our nurse this was not on properly. 10/07/16; Still a substantial area on the posterior left leg. Using silver alginate 10/14/16; in general better although there is still open area which looks healthy. Still using silver alginate. He reminds me that this happen before he left for [facility]. T oday while he was showering in the morning. He had been using his juxtalite's 10/21/16; the area on his posterior  left leg is fully epithelialized. However he arrives today with a large area of tender erythema in his medial and posterior left thigh just above the knee. I have marked the area. Once again he is reluctant to consider hospitalization. I treated him with oral antibiotics in the past for a similar situation with resolution I think with doxycycline however this area it seems more extensive to me. He is not complaining of fever but does have chills and says states he is thirsty. His blood sugar today was in the 140s at home 10/25/16 the area on his posterior left leg is fully epithelialized although there is still some weeping edema. The large area of tenderness and erythema in his medial and posterior left thigh is a lot less tender although there is still a lot of swelling in this thigh. He states he feels a lot better. He is on doxycycline and Augmentin that I started last week. This will continued until Tuesday, December 26. I have ordered a duplex ultrasound of the left thigh rule out DVT whether there is an abscess something that would need to be drained I would also like to know. 11/01/16; he still has weeping edema from a not fully epithelialized area on his left posterior calf. Most of the rest of this looks a lot better. He has completed his antibiotics. His thigh is a lot better. Duplex ultrasound did not show a DVT in the thigh 11/08/16; he comes in today with more Denuded surface epithelium from the posterior aspect of his calf. There is no real evidence of cellulitis. The superior aspect of his wrap appears to have put quite an indentation in his leg just below the knee and this may have contributed. He does not complain of pain or fever. We have been using silver alginate as the primary dressing. The area of cellulitis in the right thigh has totally resolved. He has been using his compression stockings once a week 11/15/16; the patient arrives today with more loss of epithelium from the  posterior aspect of his left calf. He now has a fairly substantial wound in this area. The reason behind this deterioration isn't exactly clear although his edema is not well controlled. He states he feels he is generally more swollen systemically. He is not complaining of chest pain shortness of breath fever. T me he has an appointment with his primary physician in early February. He is on 80 mg of oral ells Lasix a day. He claims compliance with the external compression pumps. He is not having any pain in his legs similar to what he has with his recurrent cellulitis  11/22/16; the patient arrives a follow-up of his large area on his left lateral calf. This looks somewhat better today. He came in earlier in the week for a dressing change since I saw him a week ago. He is not complaining of any pain no shortness of breath no chest pain 11/28/16; the patient arrives for follow-up of his large area on the left lateral calf this does not look better. In fact it is larger weeping edema. The surface of the wound does not look too bad. We have been using silver alginate although I'm not certain that this is a dressing issue. 12/05/16; again the patient follows up for a large wound on the left lateral and left posterior calf this does not look better. There continues to be weeping edema necrotic surface tissue. More worrisome than this once again there is erythema below the wound involving the distal Achilles and heel suggestive of cellulitis. He is on his feet working most of the day of this is not going well. We are changing his dressing twice a week to facilitate the drainage. 12/12/16; not much change in the overall dimensions of the large area on the left posterior calf. This is very inflamed looking. I gave him an. Doxycycline last week does not really seem to have helped. He found the wrap very painful indeed it seems to of dog into his legs superiorly and perhaps around the heel. He came in early today  because the drainage had soaked through his dressings. 12/19/16- patient arrives for follow-up evaluation of his left lower extremity ulcers. He states that he is using his lymphedema pumps once daily when there is "no drainage". He admits to not using his lipedema pumps while under current treatment. His blood sugars have been consistently between 150-200. 12/26/16; the patient is not using his compression pumps at home because of the wetness on his feet. I've advised him that I think it's important for him to use this daily. He finds his feet too wet, he can put a plastic bag over his legs while he is in the pumps. Otherwise I think will be in a vicious circle. We are using silver alginate to the major area on his left posterior calf 01/02/17; the patient's posterior left leg has further of all into 3 open wounds. All of them covered with a necrotic surface. He claims to be using his compression pumps once a day. His edema control is marginal. Continue with silver alginate 01/10/17; the patient's left posterior leg actually looks somewhat better. There is less edema, less erythema. Still has 3 open areas covered with a necrotic surface requiring debridement. He claims to be using his compression pumps once a day his edema control is better 01/17/17; the patient's left posterior calf look better last week when I saw him and his wrap was changed 2 days ago. He has noted increasing pain in the left heel and arrives today with much larger wounds extensive erythema extending down into the entire heel area especially tender medially. He is not systemically unwell CBGs have been controlled no fever. Our intake nurse showed me limegreen drainage on his AVD pads. 01/24/17; his usual this patient responds nicely to antibiotics last week giving him Levaquin for presumed Pseudomonas. The whole entire posterior part of his leg is much better much less inflamed and in the case of his Achilles heel area much less tender.  He has also had some epithelialization posteriorly there are still open areas here and still draining but  overall considerably better 01/31/17- He has continue to tolerate the compression wraps. he states that he continues to use the lymphedema pumps daily, and can increase to twice daily on the weekends. He is voicing no complaints or concerns regarding his LLE ulcers 02/07/17-he is here for follow-up evaluation. He states that he noted some erythema to the left medial and anterior thigh, which he states is new as of yesterday. He is concerned about recurrent cellulitis. He states his blood sugars have been slightly elevated, this morning in the 180s 02/14/17; he is here for follow-up evaluation. When he was last here there was erythema superiorly from his posterior wound in his anterior thigh. He was prescribed Levaquin however a culture of the wound surface grew MRSA over the phone I changed him to doxycycline on Monday and things seem to be a lot better. 02/24/17; patient missed his appointment on Friday therefore we changed his nurse visit into a physician visit today. Still using silver alginate on the large area of the posterior left thigh. He isn't new area on the dorsal left second toe 03/03/17; actually better today although he admits he has not used his external compression pumps in the last 2 days or so because of work responsibilities over the weekend. 03/10/17; continued improvement. External compression pumps once a day almost all of his wounds have closed on the posterior left calf. Better edema control 03/17/17; in general improved. He still has 3 small open areas on the lateral aspect of his left leg however most of the area on the posterior part of his leg is epithelialized. He has better edema control. He has an ABD pad under his stocking on the right anterior lower leg although he did not let us look at that today. 03/24/17; patient arrives back in clinic today with no open areas however  there are areas on the posterior left calf and anterior left calf that are less than 100% epithelialized. His edema is well controlled in the left lower leg. There is some pitting edema probably lymphedema in the left upper thigh. He uses compression pumps at home once per day. I tried to get him to do this twice a day although he is very reticent. 04/01/2017 -- for the last 2 days he's had significant redness, tenderness and weeping and came in for an urgent visit today. 04/07/17; patient still has 6 more days of doxycycline. He was seen by Dr. Meyer Russel last Wednesday for cellulitis involving the posterior aspect, lateral aspect of his Involving his heel. For the most part he is better there is less erythema and less weeping. He has been on his feet for 12 hours o2 over the weekend. Using his compression pumps once a day 04/14/17 arrives today with continued improvement. Only one area on the posterior left calf that is not fully epithelialized. He has intense bilateral venous inflammation associated with his chronic venous insufficiency disease and secondary lymphedema. We have been using silver alginate to the left posterior calf wound In passing he tells [place] today that the right leg but we have not seen in quite some time has an open area on it but he doesn't want [place] to look at this today states he will show this to [place] next week. 04/21/17; there is no open area on his left leg although he still reports some weeping edema. He showed [place] his right leg today which is the first time we've seen this leg in a long time. He has a large area of  open wound on the right leg anteriorly healthy granulation. Quite a bit of swelling in the right leg and some degree of venous inflammation. He told us about the right leg in passing last week but states that deterioration in the right leg really only happened over the weekend 04/28/17; there is no open area on the left leg although there is an irritated part on the  posterior which is like a wrap injury. The wound on the right leg which was new from last week at least to [place] is a lot better. 05/05/17; still no open area on the left leg. Patient is using his new compression stocking which seems to be doing a good job of controlling the edema. He states he is using his compression pumps once per day. The right leg still has an open wound although it is better in terms of surface area. Required debridement. A lot of pain in the posterior right Achilles marked tenderness. Usually this type of presentation this patient gives concern for an active cellulitis 05/12/17; patient arrives today with his major wound from last week on the right lateral leg somewhat better. Still requiring debridement. He was using his compression stocking on the left leg however that is reopened with superficial wounds anteriorly he did not have an open wound on this leg previously. He is still using his juxta light's once daily at night. He cannot find the time to do this in the morning as he has to be at work by 7 AM 05/19/17; right lateral leg wound looks improved. No debridement required. The concerning area is on the left posterior leg which appears to almost have a subcutaneous hemorrhagic component to it. We've been using silver alginate to all the wounds 05/26/17; the right lateral leg wound continues to look improved. However the area on the left posterior calf is a tightly adherent surface. [place] using silver alginate. Because of the weeping edema in his legs there is very little good alternatives. 06/02/17; the patient left here last week looking quite good. Major wound on the left posterior calf and a small one on the right lateral calf. Both of these look satisfactory. He tells me that by Wednesday he had noted increased pain in the left leg and drainage. He called on Thursday and Friday to get an appointment here but we were blocked. He did not go to urgent care or his primary  physician. He thinks he had a fever on Thursday but did not actually take his temperature. He has not been using his compression pumps on the left leg because of pain. I advised him to go to the emergency room today for IV antibiotics for stents of left leg cellulitis but he has refused I have asked him to take 2 days off work to keep his leg elevated and he has refused this as well. In view of this I'm going to call him and Augmentin and doxycycline. 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 However after a detailed discussion regarding the risk benefits and alternatives he refuses to get admitted to the hospital. With no other recourse I will continue him on oral antibiotics as before and hopefully he'll have his infectious disease consultation this week. 06/16/2017 -- the patient was seen  today by the nurse practitioner at infectious disease Ms. Dixon. 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 He has also been advise Lotrimin cream locally. she also had wise IV antibiotics for his condition if it flares up 06/23/17; patient arrives today with drainage bilaterally although the remaining wound on the left posterior calf after cleaning up today "highlighter yellow drainage" did not look too bad. Unfortunately he has had breakdown on the right anterior leg [previously this leg had not been open and he is using a black stocking] he went to see infectious disease and is been put on clindamycin 150 mg daily, I did not verify the dose although I'm not familiar with using clindamycin in  this dosing range, perhaps for prophylaxisoo 06/27/17; I brought this patient back today to follow-up on the wound deterioration on the right lower leg together with surrounding cellulitis. I started him on doxycycline 4 days ago. This area looks better however he comes in today with intense cellulitis on the medial part of his left thigh. This is not have a wound in this area. Extremely tender. We've been using silver alginate to the wounds on the right lower leg left lower leg with bilateral 4 layer compression he is using his external compression pumps once a day 07/04/17; patient's left medial thigh cellulitis looks better. He has not been using his compression pumps as his insert said it was contraindicated with cellulitis. His right leg continues to make improvements all the wounds are still open. We only have one remaining wound on the left posterior calf. Using silver alginate to all open areas. He is on doxycycline which I started a week ago and should be finishing I gave him Augmentin after Thursday's visit for the severe cellulitis on the left medial thigh which fortunately looks better 07/14/17; the patient's left medial thigh cellulitis has resolved. The cellulitis in his right lower calf on the right also looks better. All of his wounds are stable to improved we've been using silver alginate he has completed the antibiotics I have given him. He has clindamycin 150 mg once a day prescribed by infectious disease for prophylaxis, I've advised him to start this now. We have been using bilateral Unna boots over silver alginate to the wound areas 07/21/17; the patient is been to see infectious disease who noted his recurrent problems with cellulitis. He was not able to tolerate prophylactic clindamycin therefore he is on amoxicillin 500 twice a day. He also had a second daily dose of Lasix added By Dr. Oneta Rack but he is not taking this. Nor is he being completely compliant with his compression  pumps a especially not this week. He has 2 remaining wounds one on the right posterior lateral lower leg and one on the left posterior medial lower leg. 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 The patient has Unna boots bilaterally. 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 He has bilateral Unna boots for compression and silver alginate to his wounds. Arrives today with his legs looking as good as I have seen him in quite some time. Not surprisingly his  wounds look better as well with improvement on the right lateral leg venous insufficiency wound and also the left medial leg. He is still using the compression pumps once a day 08/11/17; both legs appear to be doing better wounds on the right lateral and left medial legs look better. Skin on the right leg quite good. He is been using silver alginate as the primary dressing. I'm going to use Anasept gel calcium alginate and maintain all the secondary dressings 08/18/17; the patient continues to actually do quite well. The area on his right lateral leg is just about closed the left medial also looks better although it is still moist in this area. His edema is well controlled we have been using Anasept gel with calcium alginate and the usual secondary dressings, 4 layer compression and once daily use of his compression pumps "always been able to manage 09/01/17; the patient continues to do reasonably well in spite of his trip to [place]. The area on the right lateral leg is epithelialized. Left is much better but still open. He has more edema and more chronic erythema on the left leg [venous inflammation] 09/08/17; he arrives today with no open wound on the right lateral leg and decently controlled edema. Unfortunately his left  leg is not nearly as in his good situation as last week.he apparently had increasing edema starting on Saturday. He edema soaked through into his foot so used a plastic bag to walk around his home. The area on the medial right leg which was his open area is about the same however he has lost surface epithelium on the left lateral which is new and he has significant pain in the Achilles area of the left foot. He is already on amoxicillin chronically for prophylaxis of cellulitis in the left leg 09/15/17; he is completed a week of doxycycline and the cellulitis in the left posterior leg and Achilles area is as usual improved. He still has a lot of edema and fluid soaking through his dressings. There is no open wound on the right leg. He saw infectious disease NP today 09/22/17;As usual 1 we transition him from our compression wraps to his stockings things did not go well. He has several small open areas on the right leg. He states this was caused by the compression wrap on his skin although he did not wear this with the stockings over them. He has several superficial areas on the left leg medially laterally posteriorly. He does not have any evidence of active cellulitis especially involving the left Achilles The patient is traveling from [facility] Saturday going to [facility]. He states he isn't attempting to get an appointment with a heel objects wound center there to change his dressings. I am not completely certain whether this will work 10/06/17; the patient came in on Friday for a nurse visit and the nurse reported that his legs actually look quite good. He arrives in clinic today for his regular follow-up visit. He has a new wound on his left third toe over the PIP probably caused by friction with his footwear. He has small areas on the left leg and a very superficial but epithelialized area on the right anterior lateral lower leg. Other than that his legs look as good as I've seen him in quite  some time. We have been using silver alginate Review of systems; no chest pain no shortness of breath other than this a 10 point review of systems negative 10/20/17; seen by Dr. Meyer Russel last  week. He had taken some antibiotics [doxycycline] that he had left over. Dr. Meyer Russel thought he had candida infection and declined to give him further antibiotics. He has a small wound remaining on the right lateral leg several areas on the left leg including a larger area on the left posterior several left medial and anterior and a small wound on the left lateral. The area on the left dorsal third toe looks a lot better. ROS; Gen.; no fever, respiratory no cough no sputum Cardiac no chest pain other than this 10 point review of system is negative 10/30/17; patient arrives today having fallen in the bathtub 3 days ago. It took him a while to get up. He has pain and maceration in the wounds on his left leg which have deteriorated. He has not been using his pumps he also has some maceration on the right lateral leg. 11/03/17; patient continues to have weeping edema especially in the left leg. This saturates his dressings which were just put on on 12/27. As usual the doxycycline seems to take care of the cellulitis on his lower leg. He is not complaining of fever, chills, or other systemic symptoms. He states his leg feels a lot better on the doxycycline I gave him empirically. He also apparently gets injections at his primary doctor's officeo Rocephin for cellulitis prophylaxis. I didn't ask him about his compression pump compliance today I think that's probably marginal. Arrives in the clinic with all of his dressings primary and secondary macerated full of fluid and he has bilateral edema 11/10/17; the patient's right leg looks some better although there is still a cluster of wounds on the right lateral. The left leg is inflamed with almost circumferential skin loss medially to laterally although we are still  maintaining anteriorly. He does not have overt cellulitis there is a lot of drainage. He is not using compression pumps. We have been using silver alginate to the wound areas, there are not a lot of options here 11/17/17; the patient's right leg continues to be stable although there is still open wounds, better than last week. The inflammation in the left leg is better. Still loss of surface layer epithelium especially posteriorly. There is no overt cellulitis in the amount of edema and his left leg is really quite good, tells me he is using his compression pumps once a day. 11/24/17; patient's right leg has a small superficial wound laterally this continues to improve. The inflammation in the left leg is still improving however we have continuous surface layer epithelial loss posteriorly. There is no overt cellulitis in the amount of edema in both legs is really quite good. He states he is using his compression pumps on the left leg once a day for 5 out of 7 days 12/01/17; very small superficial areas on the right lateral leg continue to improve. Edema control in both legs is better today. He has continued loss of surface epithelialization and left posterior calf although I think this is better. We have been using silver alginate with large number of absorptive secondary dressings 4 layer on the left Unna boot on the right at his request. He tells me he is using his compression pumps once a day 12/08/17; he has no open area on the right leg is edema control is good here. ooOn the left leg however he has marked erythema and tenderness breakdown of skin. He has what appears to be a wrap injury just distal to the popliteal fossa. This is the pattern  of his recurrent cellulitis area and he apparently received penicillin at his primary physician's office really worked in my view but usually response to doxycycline given it to him several times in the past 12/15/17; the patient had already deteriorated last  Friday when he came in for his nurse check. There was swelling erythema and breakdown in the right leg. He has much worse skin breakdown in the left leg as well multiple open areas medially and posteriorly as well as laterally. He tells me he has been using his compression pumps but tells me he feels that the drainage out of his leg is worse when he uses a compression pumps. T be fair to him he is been saying this o for a while however I don't know that I have really been listening to this. I wonder if the compression pumps are working properly 12/22/17;. Once again he arrives with severe erythema, weeping edema from the left greater than right leg. Noncompliance with compression pumps. New this visit he is complaining of pain on the lateral aspect of the right leg and the medial aspect of his right thigh. He apparently saw his cardiologist Dr. [name] who was ordered an echocardiogram area and I think this is a step in the right direction 12/25/17; started his doxycycline Monday night. There is still intense erythema of the right leg especially in the anterior thigh although there is less tenderness. The erythema around the wound on the right lateral calf also is less tender. He still complaining of pain in the left heel. His wounds are about the same right lateral left medial left lateral. Superficial but certainly not close to closure. He denies being systemically unwell no fever chills no abdominal pain no diarrhea 12/29/17; back in follow-up of his extensive right calf and right thigh cellulitis. I added amoxicillin to cover possible doxycycline resistant strep. This seems to of done the trick he is in much less pain there is much less erythema and swelling. He has his echocardiogram at 11:00 this morning. X-ray of the left heel was also negative. 01/05/18; the patient arrived with his edema under much better control. Now that he is retired he is able to use his compression pumps daily and sometimes  twice a day per the patient. He has a wound on the right leg the lateral wound looks better. Area on the left leg also looks a lot better. He has no evidence of cellulitis in his bilateral thighs I had a quick peak at his echocardiogram. He is in normal ejection fraction and normal left ventricular function. He has moderate pulmonary hypertension moderately reduced right ventricular function. One would have to wonder about chronic sleep apnea although he says he doesn't snore. He'll review the echocardiogram with his cardiologist. 01/12/18; the patient arrives with the edema in both legs under exemplary control. He is using his compression pumps daily and sometimes twice daily. His wound on the right lateral leg is just about closed. He still has some weeping areas on the posterior left calf and lateral left calf although everything is just about closed here as well. I have spoken with Aldean Baker who is the patient's nurse practitioner and infectious disease. She was concerned that the patient had not understood that the parenteral penicillin injections he was receiving for cellulitis prophylaxis was actually benefiting him. I don't think the patient actually saw that I would tend to agree we were certainly dealing with less infections although he had a serious one last month. 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 He is healed. He'll be placed in juxtalite compression wraps and increase his lymphedema pumps to twice daily. We will follow up again next week to ensure there are no issues with the new regiment. 01/20/18-he is here for evaluation of bilateral lower extremity weeping edema. Yesterday he was placed in compression wrap to the right lower extremity and compression stocking to left lower shrubbery. He states he uses lymphedema pumps last night and again this morning and noted a blister to the left lower extremity. On exam he was noted to have  drainage to the right lower extremity. He will be placed in Unna boots bilaterally and follow-up next week 01/26/18; patient was actually discharged a week ago to his own juxta light stockings only to return the next day with bilateral lower extremity weeping edema.he was placed in bilateral Unna boots. He arrives today with pain in the back of his left leg. There is no open area on the right leg however there is a linear/wrap injury on the left leg and weeping edema on the left leg posteriorly. I spoke with infectious disease about 10 days ago. They were disappointed that the patient elected to discontinue prophylactic intramuscular penicillin shots as they felt it was particularly beneficial in reducing the frequency of his cellulitis. I discussed this with the patient today. He does not share this view. He'll definitely need antibiotics today. Finally he is traveling to [place] and trauma leaving this Saturday and returning a week later and he does not travel with his pumps. He is going by car 01/30/18; patient was seen 4 days ago and brought back in today for review of cellulitis in the left leg posteriorly. I put him on amoxicillin this really hasn't helped as much as I might like. He is also worried because he is traveling to [place] by car. Finally we will be rewrapping him. There is no open area on the right leg over his left leg has multiple weeping areas as usual 02/09/18; The same wrap on for 10 days. He did not pick up the last doxycycline I prescribed for him. He apparently took 4 days worth he already had. There is nothing open on his right leg and the edema control is really quite good. He's had damage in the left leg medially and laterally especially probably related to the prolonged use of Unna boots 02/12/18; the patient arrived in clinic today for a nurse visit/wrap change. He complained of a lot of pain in the left posterior calf. He is taking doxycycline that I previously  prescribed for him. Unfortunately even though he used his stockings and apparently used to compression pumps twice a day he has weeping edema coming out of the lateral part of his right leg. This is coming from the lower anterior lateral skin area. 02/16/18; the patient has finished his doxycycline and will finish the amoxicillin 2 days. The area of cellulitis in the left calf posteriorly has resolved. He is no longer having any pain. He tells me he is using his compression pumps at least once a day sometimes twice. 02/23/18; the patient finished his doxycycline and Amoxil last week. On Friday he noticed a small erythematous circle about the size of a quarter on the left lower leg just above his ankle. This rapidly expanded and he now has erythema on the lateral and posterior part of the thigh. This is bright red. Also has an area on the dorsal  foot just above his toes and a tender area just below the left popliteal fossa. He came off his prophylactic penicillin injections at his own insistence one or 2 months ago. This is obviously deteriorated since then 03/02/18; patient is on doxycycline and Amoxil. Culture I did last week of the weeping area on the back of his left calf grew group B strep. I have therefore renewed the amoxicillin 500 3 times a day for a further week. He has not been systemically unwell. Still complaining of an area of discomfort right under his left popliteal fossa. There is no open wound on the right leg. He tells me that he is using his pumps twice a day on most days 03/09/18; patient arrives in clinic today completing his amoxicillin today. The cellulitis on his left leg is better. Furthermore he tells me that he had intramuscular penicillin shots that his primary care office today. However he also states that the wrap on his right leg fell down shortly after leaving clinic last week. He developed a large blister that was present when he came in for a nurse visit later in the week  and then he developed intense discomfort around this area.He tells me he is using his compression pumps 03/16/18; the patient has completed his doxycycline. The infectious part of this/cellulitis in the left heel area left popliteal area is a lot better. He has 2 open areas on the right calf. Still areas on the left calf but this is a lot better as well. 03/24/18; the patient arrives complaining of pain in the left popliteal area again. He thinks some of this is wrap injury. He has no open area on the right leg and really no open area on the left calf either except for the popliteal area. He claims to be compliant with the compression pumps 03/31/18; I gave him doxycycline last week because of cellulitis in the left popliteal area. This is a lot better although the surface epithelium is denuded off and response to this. He arrives today with uncontrolled edema in the right calf area as well as a fingernail injury in the right lateral calf. There is only a few open areas on the left 04/06/18; I gave him amoxicillin doxycycline over the last 2 weeks that the amoxicillin should be completing currently. He is not complaining of any pain or systemic symptoms. The only open areas see has is on the right lateral lower leg paradoxically I cannot see anything on the left lower leg. He tells me he is using his compression pumps twice a day on most days. Silver alginate to the wounds that are open under 4 layer compression 04/13/18; he completed antibiotics and has no new complaints. Using his compression pumps. Silver alginate that anything that's opened 04/20/18; he is using his compression pumps religiously. Silver alginate 4 layer compression anything that's opened. He comes in today with no open wounds on the left leg but 3 on the right including a new one posteriorly. He has 2 on the right lateral and one on the right posterior. He likes Unna boots on the right leg for reasons that aren't really clear we had the  usual 4 layer compression on the left. It may be necessary to move to the 4 layer compression on the right however for now I left them in the Unna boots 04/27/18; he is using his compression pumps at least once a day. He has still the wounds on the right lateral calf. The area right posteriorly  has closed. He does not have an open wound on the left under 4 layer compression however on the dorsal left foot just proximal to the toes and the left third toe 2 small open areas were identified 05/11/18; he has not uses compression pumps. The areas on the right lateral calf have coalesced into one large wound necrotic surface. On the left side he has one small wound anteriorly however the edema is now weeping out of a large part of his left leg. He says he wasn't using his pumps because of the weeping fluid. I explained to him that this is the time he needs to pump more 05/18/18; patient states he is using his compression pumps twice a day. The area on the right lateral large wound albeit superficial. On the left side he has innumerable number of small new wounds on the left calf particularly laterally but several anteriorly and medially. All these appear to have healthy granulated base these look like the remnants of blisters however they occurred under compression. The patient arrives in clinic today with his legs somewhat better. There is certainly less edema, less multiple open areas on the left calf and the right anterior leg looks somewhat better as well superficial and a little smaller. However he relates pain and erythema over the last 3-4 days in the thigh and I looked at this today. He has not been systemically unwell no fever no chills no change in blood sugar values 05/25/18; comes in today in a better state. The severe cellulitis on his left leg seems better with the Keflex. Not as tender. He has not been systemically unwell ooHard to find an open wound on the left lower leg using his compression  pumps twice a day ooThe confluent wounds on his right lateral calf somewhat better looking. These will ultimately need debridement I didn't do this today. 06/01/18; the severe cellulitis on the left anterior thigh has resolved and he is completed his Keflex. ooThere is no open wound on the left leg however there is a superficial excoriation at the base of the third toe dorsally. Skin on the bottom of his left foot is macerated looking. ooThe left the wounds on the lateral right leg actually looks some better although he did require debridement of the top half of this wound area with an open curet 06/09/18 on evaluation today patient appears to be doing poorly in regard to his right lower extremity in particular this appears to likely be infected he has very thick purulent discharge along with a bright green tent to the discharge. This makes me concerned about the possibility of pseudomonas. He's also having increased discomfort at this point on evaluation. Fortunately there does not appear to be any evidence of infection spreading to the other location at this time. 06/16/18 on evaluation today patient appears to actually be doing fairly well. His ulcer has actually diminished in size quite significantly at this point which is good news. Nonetheless he still does have some evidence of infection he did see infectious disease this morning before coming here for his appointment. I did review the results of their evaluation and their note today. They did actually have him discontinue the Cipro and initiate treatment with linezolid at this time. He is doing this for the next seven days and they recommended a follow-up in four months with them. He is the keep a log of the need for intermittent antibiotic therapy between now and when he falls back up with infectious disease.  This will help them gaze what exactly they need to do to try and help them out. 06/23/18; the patient arrives today with no open wounds on  the left leg and left third toe healed. He is been using his compression pumps twice a day. On the right lateral leg he still has a sizable wound but this is a lot better than last time I saw this. 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 Has been using silver alginate under 4 layer compression 06/30/18; the only open wound he has is on the right lateral leg and this looks healthy. No debridement is required. We have been using silver alginate. He does not have an open wound on the left leg. There is apparently some drainage from the dorsal proximal third toe on the left although I see no open wound here. 07/03/18 on evaluation today patient was actually here just for a nurse visit rapid change. However when he was here on Wednesday for his rat change due to having been healed on the left and then developing blisters we initiated the wrap again knowing that he would be back today for [place] to reevaluate and see were at. Unfortunately he has developed some cellulitis into the proximal portion of his right lower extremity even into the region of his thigh. 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 He was placed on one as what at that point. Nonetheless he is done with that and has been tolerating it well otherwise. Doxycycline which in the past really did not seem to be effective for him. Nonetheless I think the best option may be for [place] to definitely reinitiate the antibiotics for a longer period of time. 07/07/18; since I last saw this patient a week ago he has had a difficult time. At that point he did not have an open wound on his left leg. We transitioned him into juxta light stockings. He was apparently in the clinic the next day with blisters on the left lateral and left medial lower calf. He also had weeping edema fluid. He was put back into a compression  wrap. He was also in the clinic on Friday with intense erythema in his right thigh. Per the patient he was started on Bactrim however that didn't work at all in terms of relieving his pain and swelling. He has taken 3 doxycycline that he had left over from last time and that seems to of helped. He has blistering on the right thigh as well. 07/14/18; the erythema on his right thigh has gotten better with doxycycline that he is finishing. 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 Presumably this cellulitis in the thigh was not related to that although I think this is a bit concerning going forward. He still has an area on the right lateral calf the blister on the right medial calf just below the knee that was discussed above. On the left 2 small open areas left medial and left lateral. Edema control is adequate. He is using his compression pumps twice a day 07/20/18; continued improvement in the condition of both legs especially the edema in his bilateral thighs. He tells me he is been losing weight through a combination of diet and exercise. He is using his compression pumps twice  a day. So overall she made to the remaining wounds 07/27/2018; continued improvement in condition of both legs. His edema is well controlled. The area on the right lateral leg is just about closed he had one blisters show up on the medial left upper calf. We have him in 4 layer compression. He is going on a 10-day trip to [place], [place] and [place]. He will be driving. He wants to wear Unna boots because of the lessening amount of constriction. He will not use compression pumps while he is away 08/05/18 on evaluation today patient actually appears to be doing decently well all things considered in regard to his bilateral lower extremities. The worst ulcer is actually only posterior aspect of his left lower extremity with a four layer compression  wrap cut into his leg a couple weeks back. He did have a trip and actually had [employer] for the trip that he is worn since he was last here. Nonetheless he feels like the [employer] actually do better for him his swelling is up a little bit but he also with his trip was not taking his Lasix on a regular set schedule like he was supposed to be. He states that obviously the reason being that he cannot drive and keep going without having to urinate too frequently which makes it difficult. He did not have his pumps with him while he was away either which I think also maybe playing a role here too. 08/13/2018; the patient only has a small open wound on the right lateral calf which is a big improvement in the last month or 2. He also has the area posteriorly just below the posterior fossa on the left which I think was a wrap injury from several weeks ago. He has no current evidence of cellulitis. He tells me he is back into his compression pumps twice a day. He also tells me that while he was at the laundromat somebody stole a section of his extremitease stockings 08/20/2018; back in the clinic with a much improved state. He only has small areas on the right lateral mid calf which is just about healed. This was is more substantial area for quite a prolonged period of time. He has a small open area on the left anterior tibia. The area on the posterior calf just below the popliteal fossa is closed today. He is using his compression pumps twice a day 08/28/2018; patient has no open wound on the right leg. He has a smattering of open areas on the calf with some weeping lymphedema. More problematically than that it looks as though his wraps of slipped down in his usual he has very angry upper area of edema just below the right medial knee and on the right lateral calf. He has no open area on his feet. The patient is traveling to [facility] next week. I will send him in an antibiotic. We will  continue to wrap the right leg. We ordered extremitease stockings for him last week and I plan to transition the right leg to a stocking when he gets home which will be in 10 days time. As usual he is very reluctant to take his pumps with him when he travels 09/07/2018; patient returns from [facility]. He shows me a picture of his left leg in the mid part of his trip last week with intense fire engine erythema. The picture look bad enough I would have considered sending him to the hospital. Instead  he went to the wound care center in [facility]. They did not prescribe him antibiotics but he did take some doxycycline he had leftover from a previous visit. I had given him trimethoprim sulfamethoxazole before he left this did not work according to the patient. This is resulted in some improvement fortunately. He comes back with a large wound on the left posterior calf. Smaller area on the left anterior tibia. Denuded blisters on the dorsal left foot over his toes. Does not have much in the way of wounds on the right leg although he does have a very tender area on the right posterior area just below the popliteal fossa also suggestive of infection. He promises me he is back on his pumps twice a day 09/15/2018; the intense cellulitis in his left lower calf is a lot better. The wound area on the posterior left calf is also so better. However he has reasonably extensive wounds on the dorsal aspect of his second and third toes and the proximal foot just at the base of the toes. There is nothing open on the right leg 09/22/2018; the patient has excellent edema control in his legs bilaterally. He is using his external compression pumps twice a day. He has no open area on the right leg and only the areas in the left foot dorsally second and third toe area on the left side. He does not have any signs of active cellulitis. 10/06/2018; the patient has good edema control bilaterally. He has no open wound  on the right leg. There is a blister in the posterior aspect of his left calf that we had to deal with today. He is using his compression pumps twice a day. There is no signs of active cellulitis. We have been using silver alginate to the wound areas. He still has vulnerable areas on the base of his left first second toes dorsally He has a his extremities stockings and we are going to transition him today into the stocking on the right leg. He is cautioned that he will need to continue to use the compression pumps twice a day. If he notices uncontrolled edema in the right leg he may need to go to 3 times a day. 10/13/2018; the patient came in for a nurse check on Friday he has a large flaccid blister on the right medial calf just below the knee. We unroofed this. He has this and a new area underneath the posterior mid calf which was undoubtedly a blister as well. He also has several small areas on the right which is the area we put his extremities stocking on. 10/19/2018; the patient went to see infectious disease this morning I am not sure if that was a routine follow-up in any case the doxycycline I had given him was discontinued and started on linezolid. He has not started this. It is easy to look at his left calf and the inflammation and think this is cellulitis however he is very tender in the tissue just below the popliteal fossa and I have no doubt that there is infection going on here. He states the problem he is having is that with the compression pumps the edema goes down and then starts walking the wrap falls down. We will see if we can adhere this. He has 1 or 2 minuscule open areas on the right still areas that are weeping on the posterior left calf, the base of his left second and third toes 10/26/18; back today in clinic with  quite of skin breakdown in his left anterior leg. This may have been infection the area below the popliteal fossa seems a lot better however tremendous epithelial  loss on the left anterior mid tibia area over quite inexpensive tissue. He has 2 blisters on the right side but no other open wound here. 10/29/2018; came in urgently to see [place] today and we worked him in for review. He states that the 4 layer compression on the right leg caused pain he had to cut it down to roughly his mid calf this caused swelling above the wrap and he has blisters and skin breakdown today. As a result of the pain he has not been using his pumps. Both legs are a lot more edematous and there is a lot of weeping fluid. 11/02/18; arrives in clinic with continued difficulties in the right leg> left. Leg is swollen and painful. multiple skin blisters and new open areas especially laterally. He has not been using his pumps on the right leg. 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 He is not systemically unwell. 11/09/2018; the patient claims he is being compliant with his pumps. He is finished the doxycycline I gave him last week. Culture I did of the wound on the right lateral leg showed a few very resistant methicillin staph aureus. This was resistant to doxycycline. Nevertheless he states the pain in the leg is a lot better which makes me wonder if the cultured organism was not really what was causing the problem nevertheless this is a very dangerous organism to be culturing out of any wound. His right leg is still a lot larger than the left. He is using an Radio broadcast assistant on this area, he blames a 4-layer compression for causing the original skin breakdown which I doubt is true however I cannot talk him out of it. We have been using silver alginate to all of these areas which were initially blisters 11/16/2018; patient is being compliant with his external compression pumps at twice a day. Miraculously he arrives in clinic today with absolutely no open wounds. He has better edema control on the left where he has been using 4 layer compression  versus wound of wounds on the right and I pointed this out to him. There is no inflammation in the skin in his lower legs which is also somewhat unusual for him. There is no open wounds on the dorsal left foot. He has extremitease stockings at home and I have asked him to bring these in next week. 11/25/18 patient's lower extremity on examination today on the left appears for the most part to be wound free. He does have an open wound on the lateral aspect of the right lower extremity but this is minimal compared to what I've seen in past. He does request that we go ahead and wrap the left leg as well even though there's nothing open just so hopefully it will not reopen in short order. 1/28; patient has superficial open wounds on the right lateral calf left anterior calf and left posterior calf. His edema control is adequate. 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 We have been using silver alginate as the primary dressing. He claims compliance with his compression pumps 2/4; patient has superficial open wounds on numerous areas of his left calf and again one on the left dorsal foot. The areas on the right  lateral calf have healed. The cellulitis that I gave him doxycycline for last week is also resolved this was mostly on the left anterior calf just below the tibial tuberosity. His edema looks fairly well-controlled. He tells me he went to see his primary doctor today and had blood work ordered 2/11; once again he has several open areas on the left calf left tibial area. Most of these are small and appear to have healthy granulation. He does not have anything open on the right. The edema and control in his thighs is pretty good which is usually a good indication he has been using his pumps as requested. 2/18; he continues to have several small areas on the left calf and left tibial area. Most of these are small  healthy granulation. We put him in his stocking on the right leg last week and he arrives with a superficial open area over the right upper tibia and a fairly large area on the right lateral tibia in similar condition. His edema control actually does not look too bad, he claims to be using his compression pumps twice a day 2/25. Continued small areas on the left calf and left tibial area. New areas especially on the right are identified just below the tibial tuberosity and on the right upper tibia itself. There are also areas of weeping edema fluid even without an obvious wound. He does not have a considerable degree of lymphedema but clearly there is more edema here than his skin can handle. He states he is using the pumps twice a day. We have an Unna boot on the right and 4 layer compression on the left. 3/3; he continues to have an area on the right lateral calf and right posterior calf just below the popliteal fossa. There is a fair amount of tenderness around the wound on the popliteal fossa but I did not see any evidence of cellulitis, could just be that the wrap came down and rubbed in this area. ooHe does not have an open area on the left leg however there is an area on the left dorsal foot at the base of the third toe ooWe have been using silver alginate to all wound areas 3/10; he did not have an open area on his left leg last time he was here a week ago. T oday he arrives with a horizontal wound just below the tibial tuberosity and an area on the left lateral calf. He has intense erythema and tenderness in this area. The area is on the right lateral calf and right posterior calf better than last week. We have been using silver alginate as usual 3/18 - Patient returns with 3 small open areas on left calf, and 1 small open area on right calf, the skin looks ok with no significant erythema, he continues the UNA boot on right and 4 layer compression on left. The right lateral calf wound is  closed , the right posterior is small area. we will continue silver alginate to the areas. 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 He is been tolerating the dressing changes without complication. Fortunately she has made excellent improvement in regard to the overall status of his wounds. Unfortunately every time we cease wrapping him he ends up reopening in causing more significant issues at that point. Again I'm  unsure of the best direction to take although I think the lymphedema clinic may be appropriate for him. 02/03/19 on evaluation today patient appears to be doing well in regard to the wounds that we saw him for last week unfortunately he has a new area on the proximal portion of his right medial/posterior lower extremity where the wrap somewhat slowed down and caused swelling and a blister to rub and open. Unfortunately this is the only opening that he has on either leg at this point. 02/17/19 on evaluation today patient's bilateral lower extremities appear to be doing well. He still completely healed in regard to the left lower extremity. In regard to the right lower extremity the area where the wrap and slid down and caused the blister still seems to be slightly open although this is dramatically better than during the last evaluation two weeks ago. I'm very pleased with the way this stands overall. 03/03/19 on evaluation today patient appears to be doing well in regard to his right lower extremity in general although he did have a new blister open this does not appear to be showing any evidence of active infection at this time. Fortunately there's No fevers, chills, nausea, or vomiting noted at this time. Overall I feel like he is making good progress it does feel like that the right leg will we  perform the [employer] seems to do with a bit better than three layer wrap on the left which slid down on him. We may switch to doing bilateral in the book wraps. 5/4; I have not seen Mr. Embleton in quite some time. According to our case manager he did not have an open wound on his left leg last week. He had 1 remaining wound on the right posterior medial calf. He arrives today with multiple openings on the left leg probably were blisters and/or wrap injuries from Unna boots. I do not think the Unna boot's will provide adequate compression on the left. I am also not clear about the frequency he is using the compression pumps. 03/17/19 on evaluation today patient appears to be doing excellent in regard to his lower extremities compared to last week's evaluation apparently. He had gotten significantly worse last week which is unfortunate. The [employer] wrap on the left did not seem to do very well for him at all and in fact it didn't control his swelling significantly enough he had an additional outbreak. Subsequently we go back to the four layer compression wrap on the left. This is good news. At least in that he is doing better and the wound seem to be killing him. He still has not heard anything from the lymphedema clinic. 03/24/19 on evaluation today patient actually appears to be doing much better in regard to his bilateral lower Trinity as compared to last week when I saw him. Fortunately there's no signs of active infection at this time. He has been tolerating the dressing changes without complication. Overall I'm extremely pleased with the progress and appearance in general. 04/07/19 on evaluation today patient appears to be doing well in regard to his bilateral lower extremities. His swelling is significantly down from where it was previous. With that being said he does have a couple blisters still open at this point but fortunately nothing that seems to be too severe and again the majority of the  larger openings has healed at this time. 04/14/19 on evaluation today patient actually appears to be doing quite well in regard to his bilateral  lower extremities in fact I'm not even sure there's anything significantly open at this time at any site. Nonetheless he did have some trouble with these wraps where they are somewhat irritating him secondary to the fact that he has noted that the graph wasn't too close down to the end of this foot in a little bit short as well up to his knee. Otherwise things seem to be doing quite well. 04/21/19 upon evaluation today patient's wound bed actually showed evidence of being completely healed in regard to both lower extremities which is excellent news. There does not appear to be any signs of active infection which is also good news. I'm very pleased in this regard. No fevers, chills, nausea, or vomiting noted at this time. 04/28/19 on evaluation today patient appears to be doing a little bit worse in regard to both lower extremities on the left mainly due to the fact that when he went infection disease the wrap was not wrapped quite high enough he developed a blister above this. On the right he is a small open area of nothing too significant but again this is continuing to give him some trouble he has been were in the Velcro compression that he has at home. 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 He's been tolerating the dressing changes without complication. Fortunately there's no signs of active infection at this time. No fevers, chills, nausea, or vomiting noted at this time. We have been trying to get an appointment with her lymphedema clinic in [facility] but unfortunately nobody can get them on phone with not been able to even fax information over the patient likewise is not been able to get in touch with them. Overall I'm not sure exactly what's going on here with to reach out  again today. 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 Still having a lot of drainage unfortunately. He tells me especially in the left but even on the right. There's no signs of active infection which is good news we've been using so ratcheted up to this point. 05/19/19 on evaluation today patient actually appears to be doing quite well with regard to his left lower extremity which is great news. Fortunately in regard to the right lower extremity has an issues with his wrap and he subsequently did remove this from what I'm understanding. Nonetheless long story short is what he had rewrapped once he removed it subsequently had maggots underneath this wrap whenever he came in for evaluation today. With that being said they were obviously completely cleaned away by the nursing staff. The visit today which is excellent news. However he does appear to potentially have some infection around the right ankle region where the maggots were located as well. He will likely require anabiotic therapy today. 05/26/19 on evaluation today patient actually appears to be doing much better in regard to his bilateral lower extremities. I feel like the infection is under much better control. With that being said there were maggots noted when the wrap was removed yet again today. Again this could have potentially been left over from previous although at this time there does not appear to be any signs of significant drainage there was obviously on the wrap some drainage as well this contracted gnats or otherwise. Either way I do not see anything that appears to be doing worse in my  pinion and in fact I think his drainage has slowed down quite significantly likely mainly due to the fact to his infection being under better control. 06/02/2019 on evaluation today patient actually appears to be doing well with regard to his bilateral lower  extremities there is no signs of active infection at this time which is great news. With that being said he does have several open areas more so on the right than the left but nonetheless these are all significantly better than previously noted. 06/09/2019 on evaluation today patient actually appears to be doing well. His wrap stayed up and he did not cause any problems he had more drainage on the right compared to the left but overall I do not see any major issues at this time which is great news. 06/16/2019 on evaluation today patient appears to be doing excellent with regard to his lower extremities the only area that is open is a new blister that can have opened as of today on the medial ankle on the left. Other than this he really seems to be doing great I see no major issues at this point. 06/23/2019 on evaluation today patient appears to be doing quite well with regard to his bilateral lower extremities. In fact he actually appears to be almost completely healed there is a small area of weeping noted of the right lower extremity just above the ankle. Nonetheless fortunately there is no signs of active infection at this time which is good news. No fevers, chills, nausea, vomiting, or diarrhea. 8/24; the patient arrived for a nurse visit today but complained of very significant pain in the left leg and therefore I was asked to look at this. Noted that he did not have an open area on the left leg last week nevertheless this was wrapped. The patient states that he is not been able to put his compression pumps on the left leg because of the discomfort. He has not been systemically unwell 06/30/2019 on evaluation today patient unfortunately despite being excellent last week is doing much worse with regard to his left lower extremity today. In fact he had to come in for a nurse on Monday where his left leg had to be rewrapped due to excessive weeping Dr. Leanord Hawking placed him on doxycycline at that  point. Fortunately there is no signs of active infection Systemically at this time which is good news. 07/07/2019 in regard to the patient's wounds today he actually seems to be doing well with his right lower extremity there really is nothing open or draining at this point this is great news. Unfortunately the left lower extremity is given him additional trouble at this time. There does not appear to be any signs of active infection nonetheless he does have a lot of edema and swelling noted at this point as well as blistering all of which has led to a much more poor appearing leg at this time compared to where it was 2 weeks ago when it was almost completely healed. Obviously this is a little discouraging for the patient. He is try to contact the lymphedema clinic in [place] he has not been able to get through to them. 07/14/2019 on evaluation today patient actually appears to be doing slightly better with regard to his left lower extremity ulcers. Overall I do feel like at least at the top of the wrap that we have been placing this area has healed quite nicely and looks much better. The remainder of the leg is showing signs  of improvement. Unfortunately in the thigh area he still has an open region on the left and again on the right he has been utilizing just a Band-Aid on an area that also opened on the thigh. Again this is an area that were not able to wrap although we did do an Ace wrap to provide some compression that something that obviously is a little less effective than the compression wraps we have been using on the lower portion of the leg. He does have an appointment with the lymphedema clinic in [facility] on Friday. 07/21/2019 on evaluation today patient appears to be doing better with regard to his lower extremity ulcers. He has been tolerating the dressing changes without complication. Fortunately there is no signs of active infection at this time. No fevers, chills, nausea,  vomiting, or diarrhea. I did receive the paperwork from the physical therapist at the lymphedema clinic in [place]. Subsequently I signed off on that this morning and sent that back to him for further progression with the treatment plan. 07/28/2019 on evaluation today patient appears to be doing very well with regard to his right lower extremity where I do not see any open wounds at this point. Fortunately he is feeling great as far as that is concerned as well. In regard to the left lower extremity he has been having issues with still several areas of weeping and edema although the upper leg is doing better his lower leg still I think is going require the compression wrap at this time. No fevers, chills, nausea, vomiting, or diarrhea. 08/04/2019 on evaluation today patient unfortunately is having new wounds on the right lower extremity. Again we have been using Unna boot wrap on that side. We switched him to using his juxta lite wrap at home. With that being said he tells me he has been using it although his legs extremely swollen and to be honest really does not appear that he has been. I cannot know that for sure however. Nonetheless he has multiple new wounds on the right lower extremity at this time. Obviously we will have to see about getting this rewrapped for him today. 08/11/2019 on evaluation today patient appears to be doing fairly well with regard to his wounds. He has been tolerating the dressing changes including the compression wraps without complication. He still has a lot of edema in his upper thigh regions bilaterally he is supposed to be seeing the lymphedema clinic on the 15th of this month once his wraps arrive for the upper part of his legs. 08/18/2019 on evaluation today patient appears to be doing well with regard to his bilateral lower extremities at this point. He has been tolerating the dressing changes without complication. Fortunately there is no signs of active  infection which is also good news. He does have a couple weeping areas on the first and second toe of the right foot he also has just a small area on the left foot upper leg and a small area on the left lower leg but overall he is doing quite well in my opinion. He is supposed to be getting his wraps shortly in fact tomorrow and then subsequently is seeing the lymphedema clinic next Wednesday on the 21st. Of note he is also leaving on the 25th to go on vacation for a week to the beach. For that reason and since there is some uncertainty about what there can be doing at lymphedema clinic next Wednesday I am get a make an appointment for  next Friday here for [place] to see what we need to do for him prior to him leaving for vacation. 10/23; patient arrives in considerable pain predominantly in the upper posterior calf just distal to the popliteal fossa also in the wound anteriorly above the major wound. This is probably cellulitis and he has had this recurrently in the past. He has no open wound on the right side and he has had an Radio broadcast assistant in that area. Finally I note that he has an area on the left posterior calf which by enlarge is mostly epithelialized. This protrudes beyond the borders of the surrounding skin in the setting of dry scaly skin and lymphedema. The patient is leaving for [facility] on Sunday. Per his longstanding pattern, he will not take his compression pumps with him predominantly out of fear that they will be stolen. He therefore asked that we put a Unna boot back on the right leg. He will also contact the wound care center in [name] to see if they can change his dressing in the mid week. 11/3; patient returned from his vacation to [facility]. He was seen on 1 occasion at their wound care center. They did a 2 layer compression system as they did not have our 4-layer wrap. I am not completely certain what they put on the wounds. They did not change the Unna boot on the  right. The patient is also seeing a lymphedema specialist physical therapist in [place]. It appears that he has some compression sleeve for his thighs which indeed look quite a bit better than I am used to seeing. He pumps over these with his external compression pumps. 11/10; the patient has a new wound on the right medial thigh otherwise there is no open areas on the right. He has an area on the left leg posteriorly anteriorly and medially and an area over the left second toe. We have been using silver alginate. He thinks the injury on his thigh is secondary to friction from the compression sleeve he has. 11/17; the patient has a new wound on the right medial thigh last week. He thinks this is because he did not have a underlying stocking for his thigh juxta lite apparatus. He now has this. The area is fairly large and somewhat angry but I do not think he has underlying cellulitis. ooHe has a intact blister on the right anterior tibial area. ooSmall wound on the right great toe dorsally ooSmall area on the medial left calf. 11/30; the patient does not have any open areas on his right leg and we did not take his juxta lite stocking off. However he states that on Friday his compression wrap fell down lodging around his upper mid calf area. As usual this creates a lot of problems for him. He called urgently today to be seen for a nurse visit however the nurse visit turned into a provider visit because of extreme erythema and pain in the left anterior tibia extending laterally and posteriorly. The area that is problematic is extensive 10/06/2019 upon evaluation today patient actually appears to be doing poorly in regard to his left lower extremity. He Dr. Leanord Hawking did place him on doxycycline this past Monday apparently due to the fact that he was doing much worse in regard to this left leg. Fortunately the doxycycline does seem to be helping. Unfortunately we are still having a very difficult  time getting his edema under any type of control in order to anticipate discharge at  some point. The only way were really able to control his lymphedema really is with compression wraps and that has only even seemingly temporary. He has been seeing a lymphedema clinic they are trying to help in this regard but still this has been somewhat frustrating in general for the patient. 10/13/19 on evaluation today patient appears to be doing excellent with regard to his right lower extremity as far as the wounds are concerned. His swelling is still quite extensive unfortunately. He is still having a lot of drainage from the thigh areas bilaterally which is unfortunate. He's been going to lymphedema clinic but again he still really does not have this edema under control as far as his lower extremities are concern. With regard to his left lower extremity this seems to be improving and I do believe the doxycycline has been of benefit for him. He is about to complete the doxycycline. 10/20/2019 on evaluation today patient appears to be doing poorly in regard to his bilateral lower extremities. More in the right thigh he has a lot of irritation at this site unfortunately. In regard to the left lower extremity the wrap was not quite as high it appears and does seem to have caused him some trouble as well. Fortunately there is no evidence of systemic infection though he does have some blue-green drainage which has me concerned for the possibility of Pseudomonas. He tells me he is previously taking Cipro without complications and he really does not care for Levaquin however due to some of the side effects he has. He is not allergic to any medications specifically antibiotics that were aware of. 10/27/2019 on evaluation today patient actually does appear to be for the most part doing better when compared to last week's evaluation. With that being said he still has multiple open wounds over the bilateral lower  extremities. He actually forgot to start taking the Cipro and states that he still has the whole bottle. He does have several new blisters on left lower extremity today I think I would recommend he go ahead and take the Cipro based on what I am seeing at this point. 12/30-Patient comes at 1 week visit, 4 layer compression wraps on the left and Unna boot on the right, primary dressing Xtrasorb and silver alginate. Patient is taking his Cipro and has a few more days left probably 5-6, and the legs are doing better. He states he is using his compressions devices which I believe he has 11/10/2019 on evaluation today patient actually appears to be much better than last time I saw him 2 weeks ago. His wounds are significantly improved and overall I am very pleased in this regard. Fortunately there is no signs of active infection at this time. He is just a couple of days away from completing Cipro. Overall his edema is much better he has been using his lymphedema pumps which I think is also helping at this point. 11/17/2019 on evaluation today patient appears to be doing excellent in regard to his wounds in general. His legs are swollen but not nearly as much as they have been in the past. Fortunately he is tolerating the compression wraps without complication. No fevers, chills, nausea, vomiting, or diarrhea. He does have some erythema however in the distal portion of his right lower extremity specifically around the forefoot and toes there is a little bit of warmth here as well. 11/24/2019 on evaluation today patient appears to be doing well with regard to his right lower extremity I  really do not see any open wounds at this point. His left lower extremity does have several open areas and his right medial thigh also is open. Other than this however overall the patient seems to be making good progress and I am very pleased at this point. 12/01/2019 on evaluation today patient appears to be doing poorly at this  point in regard to his left lower extremity has several new blisters despite the fact that we have him in compression wraps. In fact he had a 4-layer compression wrap, his upper thigh wrapped from lymphedema clinic, and a juxta light over top of the 4 layer compression wrap the lymphedema clinic applied and despite all this he still develop blisters underneath. Obviously this does have me concerned about the fact that unfortunately despite what we are doing to try to get wounds healed he continues to have new areas arise I do not think he is ever good to be at the point where he can realistically just use wraps at home to keep things under control. Typically when we heal him it takes about 1-2 days before he is back in the clinic with severe breakdown and blistering of his lower extremities bilaterally. This is happened numerous times in the past. Unfortunately I think that we may need some help as far as overall fluid overload to kind of limit what we are seeing and get things under better control. 12/08/2019 on evaluation today patient presents for follow-up concerning his ongoing bilateral lower extremity edema. Unfortunately he is still having quite a bit of swelling the compression wraps are controlling this to some degree but he did see Dr. [name] his cardiologist I do have that available for review today as far as the appointment was concerned that was on 12/06/2019. Obviously that she has been 2 days ago. The patient states that he is only been taking the Lasix 80 mg 1 time a day he had told me previously he was taking this twice a day. Nonetheless Dr. [name] recommended this be up to 80 mg 2 times a day for the patient as he did appear to be fluid overloaded. With that being said the patient states he did this yesterday and he was unable to go anywhere or do anything due to the fact that he was constantly having to urinate. Nonetheless I think that this is still good to be something that is important  for him as far as trying to get his edema under control at all things that he is going to be able to just expect his wounds to get under control and things to be better without going through at least a period of time where he is trying to stabilize his fluid management in general and I think increasing the Lasix is likely the first step here. It was also mentioned the possibility that the patient may require metolazone. With that being said he wanted to have the patient take Lasix twice a day first and then reevaluating 2 months to see where things stand. 12/15/2019 upon evaluation today patient appears to be doing regard to his legs although his toes are showing some signs of weeping especially on the left at this point to some degree on the right. There does not appear to be any signs of active infection and overall I do feel like the compression wraps are doing well for him but he has not been able to take the Lasix at home and the increased dose that Dr. [name] recommended. He  tells me that just not go to be feasible for him. Nonetheless I think in this case he should probably send a message to Dr. [name] in order to discuss options from the standpoint of possible admission to get the fluid off or otherwise going forward. 12/22/2019 upon evaluation today patient appears to be doing fairly well with regard to his lower extremities at this point. In fact he would be doing excellent if it was not for the fact that his right anterior thigh apparently had an allergic reaction to adhesive tape that he used. The wound itself that we have been monitoring actually appears to be healed. There is a lot of irritation at this point. 12/29/2019 upon evaluation today patient appears to be doing well in regard to his lower extremities. His left medial thigh is open and somewhat draining today but this is the only region that is open the right has done much better with the treatment utilizing the steroid cream that I  prescribed for him last week. Overall I am pleased in that regard. Fortunately there is no signs of active infection at this time. No fevers, chills, nausea, vomiting, or diarrhea. 01/05/2020 upon evaluation today patient appears to be doing more poorly in regard to his right lower extremity at this point upon evaluation today. Unfortunately he continues to have issues in this regard and I think the biggest issue is controlling his edema. This obviously is not very well controlled at this point is been recommended that he use the Lasix twice a day but he has not been able to do that. Unfortunately I think this is leading to an issue where honestly he is not really able to effectively control his edema and therefore the wounds really are not doing significantly better. I do not think that he is going to be able to keep things under good control unless he is able to control his edema much better. I discussed this again in great detail with him today. 01/12/2020 good news is patient actually appears to be doing quite well today at this point. He does have an appointment with lymphedema clinic tomorrow. His legs appear healed and the toe on the left is almost completely healed. In general I am very pleased with how things stand at this point. 01/19/2020 upon evaluation today patient appears to actually be doing well in regard to his lower extremities there is nothing open at this point. Fortunately he has done extremely well more recently. Has been seeing lymphedema clinic as well. With that being said he has Velcro wraps for his lower legs as well as his upper legs. The only wound really is on his toe which is the right great toe and this is barely anything even there. With all that being said I think it is good to be appropriate today to go ahead and switch him over to the Velcro compression wraps. 01/26/2020 upon evaluation today patient appears to be doing worse with regard to his lower extremities after  last week switch him to Velcro compression wraps. Unfortunately he lasted less than 24 hours he did not have the sock portion of his Velcro wrap on the left leg and subsequently developed a blister underneath the Velcro portion. Obviously this is not good and not what we were looking for at this point. He states the lymphedema clinic did tell him to wear the wrap for 23 hours and take him off for 1 I am okay with that plan but again right now we  got a get things back under control again he may have some cellulitis noted as well. 02/02/2020 upon evaluation today patient unfortunately appears to have several areas of blistering on his bilateral lower extremities today mainly on the feet. His legs do seem to be doing somewhat better which is good news. Fortunately there is no evidence of active infection at this time. No fevers, chills, nausea, vomiting, or diarrhea. 02/16/2020 upon evaluation today patient appears to be doing well at this time with regard to his legs. He has a couple weeping areas on his toes but for the most part everything is doing better and does appear to be sealed up on his legs which is excellent news. We can continue with wrapping him at this point as he had every time we discontinue the wraps he just breaks out with new wounds. There is really no point in is going forward with this at this point. 03/08/2020 upon evaluation today patient actually appears to be doing quite well with regard to his lower extremity ulcers. He has just a very superficial and really almost nonexistent blister on the left lower extremity he has in general done very well with the compression wraps. With that being said I do not see any signs of infection at this time which is good news. 03/29/2020 upon evaluation today patient appears to be doing well with regard to his wounds currently except for where he had several new areas that opened up due to some of the wrap slipping and causing him trouble. He states  he did not realize they had slipped. Nonetheless he has a 1 area on the right and 3 new areas on the left. Fortunately there is no signs of active infection at this time which is great news. 04/05/2020 upon evaluation today patient actually appears to be doing quite well in general in regard to his legs currently. Fortunately there is no signs of active infection at this time. No fevers, chills, nausea, vomiting, or diarrhea. He tells me next week that he will actually be seen in the lymphedema clinic on Thursday at 10 AM I see him on Wednesday next week. 04/12/2020 upon evaluation today patient appears to be doing very well with regard to his lower extremities bilaterally. In fact he does not appear to have any open wounds at this point which is good news. Fortunately there is no signs of active infection at this time. No fevers, chills, nausea, vomiting, or diarrhea. 04/19/2020 upon evaluation today patient appears to be doing well with regard to his wounds currently on the bilateral lower extremities. There does not appear to be any signs of active infection at this time. Fortunately there is no evidence of systemic infection and overall very pleased at this point. Nonetheless after I held him out last week he literally had blisters the next morning already which swelled up with him being right back here in the clinic. Overall I think that he is just not can be able to be discharged with his legs the way they are he is much to volume overloaded as far as fluid is concerned and that was discussed with him today of also discussed this but should try the clinic nurse manager as well as Dr. Leanord Hawking. 04/26/2020 upon evaluation today patient appears to be doing better with regard to his wounds currently. He is making some progress and overall swelling is under good control with the compression wraps. Fortunately there is no evidence of active infection at this time. 05/10/2020  on evaluation today patient appears  to be doing overall well in regard to his lower extremities bilaterally. He is Tolerating the compression wraps without complication and with what we are seeing currently I feel like that he is making excellent progress. There is no signs of active infection at this time. 05/24/2020 upon evaluation today patient appears to be doing well in regard to his legs. The swelling is actually quite a bit down compared to where it has been in the past. Fortunately there is no sign of active infection at this time which is also good news. With that being said he does have several wounds on his toes that have opened up at this point. 05/31/2020 upon evaluation today patient appears to be doing well with regard to his legs bilaterally where he really has no significant fluid buildup at this point overall he seems to be doing quite well. Very pleased in this regard. With regard to his toes these also seem to be drying up which is excellent. We have continue to wrap him as every time we tried as a transition to the juxta light wraps things just do not seem to get any better. 06/07/2020 upon evaluation today patient appears to be doing well with regard to his right leg at this point. Unfortunately left leg has a lot of blistering he tells me the wrap started to slide down on him when he tried to put his other Velcro wrap over top of it to help keep things in order but nonetheless still had some issues. 06/14/2020 on evaluation today patient appears to be doing well with regard to his lower extremity ulcers and foot ulcers at this point. I feel like everything is actually showing signs of improvement which is great news overall there is no signs of active infection at this time. No fevers, chills, nausea, vomiting, or diarrhea. 06/21/2020 on evaluation today patient actually appears to be doing okay in regard to his wounds in general. With that being said the biggest issue I see is on his right foot in particular the first  and second toe seem to be doing a little worse due to the fact this is staying very wet. I think he is probably getting need to change out his dressings a couple times in between each week when we see him in regard to his toes in order to keep this drier based on the location and how this is proceeding. 06/28/2020 on evaluation today patient appears to be doing a little bit more poorly overall in regard to the appearance of the skin I am actually somewhat concerned about the possibility of him having a little bit of an infection here. We discussed the course of potentially giving him a doxycycline prescription which he is taken previously with good result. With that being said I do believe that this is potentially mild and at this point easily fixed. I just do not want anything to get any worse. 07/12/2020 upon evaluation today patient actually appears to be making some progress with regard to his legs which is great news there does not appear to be any evidence of active infection. Overall very pleased with where things stand. 07/26/2020 upon evaluation today patient appears to be doing well with regard to his leg ulcers and toe ulcers at this point. He has been tolerating the compression wraps without complication overall very pleased in this regard. 08/09/2020 upon evaluation today patient appears to be doing well with regard to his lower extremities bilaterally.  Fortunately there is no signs of active infection overall I am pleased with where things stand. 08/23/2020 on evaluation today patient appears to be doing well with regard to his wound. He has been tolerating the dressing changes without complication. Fortunately there is no signs of active infection at this time. Overall his legs seem to be doing quite well which is great news and I am very pleased in that regard. No fevers, chills, nausea, vomiting, or diarrhea. 09/13/2020 upon evaluation today patient appears to be doing okay in regard to  his lower extremities. He does have a fairly large blister on the right leg which I did remove the blister tissue from today so we can get this to dry out other than that however he seems to be doing quite well. There is no signs of active infection at this time. 09/27/2020 upon evaluation today patient appears to actually be doing some better in regard to his right leg. Fortunately signs of active infection at this time which is great news. No fevers, chills, nausea, vomiting, or diarrhea. 10/04/2020 upon evaluation today patient actually appears to be showing signs of improvement which is great news with regard to his leg ulcers. Fortunately there is no signs of active infection which is great news he is still taking the antibiotics currently. No fevers, chills, nausea, vomiting, or diarrhea. 10/18/2020 on evaluation today patient appears to be doing well with regard to his legs currently. He has been tolerating the dressing changes including the wraps without complication. Fortunately there is no signs of active infection at this time. No fevers, chills, nausea, vomiting, or diarrhea. 10/25/2020 upon evaluation today patient appears to be doing decently well in regard to his wounds currently. He has been tolerating the dressing changes without complication. Overall I feel like he is making good progress albeit slow. Again this is something we can have to continue to wrap for some time to come most likely. 11/08/2020 upon evaluation today patient appears to be doing well with regard to his wounds currently. He has been tolerating the dressing changes without complication is not currently on any antibiotics and he does not appear to show any signs of infection. He does continue to have a lot of drainage on the right leg not too severe but nonetheless this is very scattered. On the left leg this is looking to be much improved overall. 11/15/2020 upon evaluation today patient appears to be doing better  with regard to his legs bilaterally. Especially the right leg which was much more significant last week. There does not appear to be any signs of active infection which is great news. No fevers, chills, nausea, vomiting, or diarrhea. 11/23/2019 upon evaluation today patient appears to be doing poorly still in regard to his lower extremities bilaterally. Unfortunately his right leg in particular appears to be doing much more poorly there is no signs really of infection this is not warm to touch but he does have a lot of drainage and weeping unfortunately. With that reason I do believe that we may need to initiate some treatment here to try to help calm down some of the swelling of the right leg. I think switching to a 4-layer compression wrap would be beneficial here. The patient is in agreement with giving this a try. 11/29/2020 upon evaluation today patient appears to be doing well currently in regard to his leg ulcers. I feel like the right leg is doing better he still has a lot of drainage but we do  see some improvement here. The 4-layer compression wrap I think was helpful. 12/06/2020 upon evaluation today patient appears to be doing well with regard to his legs. In fact they seem to be doing about the best I have seen up to this point. Fortunately there is no signs of active infection at this time. No fevers, chills, nausea, vomiting, or diarrhea. 12/20/2020 upon evaluation today patient appears to be doing well at this time in regard to his legs. He is not having any significant draining which is great news. Fortunately there is no signs of active infection at this time. No fevers, chills, nausea, vomiting, or diarrhea. 01/17/2021 upon evaluation today Shmiel actually appears to be doing excellent in regard to his legs. He has a few areas again that come and go as far as his toes are concerned but overall this is doing quite well. 01/31/2021 upon evaluation today patient appears to be doing well with  regard to his legs. Fortunately there does not appear to be any signs of active infection which is great news. Overall he is still having significant edema despite the compression wraps basically the 4-layer compression wrap to just keep things under control there is really not much room for play. 4/13: [name] is a longstanding patient in our clinic and benefits greatly from weekly compression wraps. Today he has no complaints. He has been tolerating the wraps well. He states he is using the lymphedema pumps at home. 5/4; patient presents for follow-up of his chronic lymphedema/venous insufficiency ulcers. He comes weekly for compression wraps. He has no complaints today. He was unable to tolerate the Coflex 2 layer Last week so we will do the four press 4-layer compression. He has been using his lymphedema pumps daily. 5/18; patient presents for 2-week follow-up. He has no complaints or issues today. He has developed a new wound to the right foot on his fourth toe. He overall feels well and denies signs of infection. 6/1; patient presents for 2-week follow-up. He has no complaints or issues today. He denies signs of infection. 04/18/2021 upon evaluation today patient appears to be doing well with regard to his legs bilaterally. Family open wound is actually on the toe of his left foot everything else is completely closed which is great news. In general I am extremely pleased with where things stand at this point. The patient is also happy that things are doing so well. 05/02/2021 upon evaluation today patient's legs actually appear to be doing quite well today. Fortunately there does not appear to be any signs of active infection which is great and overall I am extremely pleased with where he stands today. The patient does not appear to have any evidence of active infection at this time which is also great news. 05/09/2021 upon evaluation today patient appears to be doing a little bit more poorly  in regard to his legs. Unfortunately he is having issues with some breakdown and a blood blister on the left leg this is due to I believe honestly to how it was wrapped last week. Fortunately there does not appear to be any signs of infection but nonetheless this is still a concern to be honest. No fevers, chills, nausea, vomiting, or diarrhea. 05/16/2021 upon evaluation today patient appears to be doing significantly better as compared to last week. I am very pleased with where things stand today. There does not appear to be any signs of infection which is great news and overall very pleased with where we stand.  No fevers, chills, nausea, vomiting, or diarrhea. 05/30/2021 upon evaluation today patient appears to be doing well with regard to his legs. He has been tolerating the dressing changes without complication. Fortunately there does not appear to be any signs of active infection which is great news and overall I am extremely pleased with where things stand today. No fevers, chills, nausea, vomiting, or diarrhea. 06/20/2021 upon evaluation today patient actually appears to be making good progress today and very pleased with what we are seeing. I think his legs are really maintaining. As long as we continue wrapping he seems to be doing excellent in my opinion. Fortunately there is no signs of active infection at this time. No fevers, chills, nausea, vomiting, or diarrhea. 07/11/2021 upon evaluation today patient actually appears to be making excellent progress at this time. Fortunately there does not appear to be any evidence of active infection which is great news and overall I am extremely pleased with where things stand today. No fevers, chills, nausea, vomiting, or diarrhea. 07/25/2021 upon evaluation today patient appears to be doing well currently in regard to his lower extremities. He has been making good progress here and I do not see anything that is actually open significantly today this is  great news. No fevers, chills, nausea, vomiting, or diarrhea. 08/08/2021 upon evaluation today patient appears to be doing well with regard to his wound. He has been tolerating the dressing changes without complication. With that being said unfortunately has a new area that opened up as far as his right posterior leg is concerned this was a blister he also has an area on the third toe right foot which also reopen. Fortunately there is no signs of active infection at this time which is great news. No fevers, chills, nausea, vomiting, or diarrhea. 10/17; patient came in today at his request initially for a nurse visit because it but out of concern for deterioration in both his lower legs and cellulitis I was asked to look at him. He comes in with increased swelling which he says started over the weekend he started to notice pain as well in his left medial ankle, right knee, left knee left dorsal foot. His wraps fell down contributing to some of this but he has not been using his compression pumps over the weekend for reasons that are not really clear. He comes in with multiple new wounds including the right posterior leg, right third toe, right fourth toe, left second toe left medial ankle left dorsal ankle and right anterior lower leg 09/19/2021 upon evaluation today patient does seem to be making improvements in general which is great news. I do not see any evidence of infection currently he does have some hypergranulation of the anterior portion of his ankle on the left side this is going require some debridement to pare this down and then subsequently silver nitrate probably due to the amount of bleeding that he is probably going to experience. He is in agreement with this plan however. 09/26/2021 upon evaluation today although the patient's legs appear to be doing okay today unfortunately he did have maggots noted during the evaluation as well. This is again quite unfortunate with the respect to the  fact that he feels like that he got the wrap wet which was noted today he is not sure when and I think this is what led to the issue. Nonetheless we can try to see what we can do about silly things off so that this does not happen  again in the future although he is can have to be diligent about taking care of his wraps as well. 10/03/2021 upon evaluation today patient appears to be doing okay in regard to his legs currently. He unfortunately has maggots again noted on the left foot. This obviously is becoming quite an issue to be perfectly honest. I do think that he needs to see what can be done at home to try to limit this exposure. Last week we had cleaned everything away so this is a new infestation as far as that is concerned. There is really not much that I can do to combat that I am trying to do what we can here in the clinic to wrap his foot and try to prevent any access of that again with knots there is a small this becomes very difficult. 10/10/2021 upon evaluation today patient appears to be doing poorly in regard to his legs he is very swollen and unfortunately I think a big part of the issue here is simply that he is not taking his fluid pills appropriately stressed probably is not helping much at all either. He still having a lot of issues as far as getting moved and having to be out of his current living condition. With that being said he does have of note maggots noted on the right foot this time completely separate from what we have been seeing he tells me that he got his wraps wet again. Its mainly when it rains it sounds like that he goes out to his car in the area where he has to park there is a area that collects a fairly large puddle according to what he tells me today either way I explained to him which we have done before as well that if he gets his wraps wet he needs to let us know so we can get them changed out. He does not need to keep them on wet. 10/17/2021 upon evaluation  today patient appears to be doing well in regard to his legs. In fact things are significantly better compared to where they were previous. Fortunately I see no evidence of infection currently which is great news. No fevers, chills, nausea, vomiting, or diarrhea. 10/24/2021 upon evaluation today patient appears to be doing awesome in regard to his legs in general. I think everything is showing signs of significant improvement which is great news. There is really only 1 opening on the second toe left foot everything else is closed. 11/07/2020 upon evaluation today patient appears to be doing well with regard to his legs in general he does have a blister on the left foot but otherwise things are doing overall quite well. 11/28/2021 upon evaluation today patient's legs appear to be doing decently well. The biggest issue we see is that he does have some blistering where the wrap seems to have slid down on the right leg near the top. This is just below the knee. That is good to be the biggest issue going forward at this point in my opinion. Otherwise I really feel like he is doing pretty well across the board with the other wound locations previously noted. 12/05/2021 upon evaluation today patient appears to be doing well with regard to the majority of his wounds in fact he has 1 area healed. Unfortunately he has a new area that broken down over the anterior portion of his left ankle. This is quite deep and draining quite a bit. Fortunately I do not see any signs of infection  here though on the lateral portion of his ankle there is some erythema and warmth to touch no drainage nothing really to culture but I am concerned about cellulitis starting up here. 12/12/2021 upon evaluation today patient appears to be doing significantly better in regard to his leg ulcers. Everything is improved as compared to the last time I saw him. I am actually very pleased with where we stand today. The patient is not having any  significant pain which is great news. 12/26/2021 upon evaluation patient appears to be doing a little bit more poorly in regard to the wound on his second toe left foot. Unfortunately with the wrap we put on he collect a lot of fluid underneath the underside of the toe and this has caused this to breakdown to some degree. Fortunately I do not see any evidence of active infection locally or systemically which is great news. No fevers, chills, nausea, vomiting, or diarrhea. 01/02/2022 upon evaluation today patient appears to be doing well with regard to his wound. He has been tolerating the dressing changes I do feel like the toe as well as the ankle area anteriorly on the left leg is doing much better. I am very pleased in that regard. 01/09/2022 upon evaluation today patient appears to be doing well with regard to his wounds for the most part the area underneath his toe is the one area on the left foot that has been most concerned. Everything else is showing signs of excellent improvement. I do not see any evidence of active infection locally or systemically which is great news and overall I think that we are headed in the right direction here to be honest. Objective Constitutional Obese and well-hydrated in no acute distress. Vitals Time Taken: 10:55 AM, Height: 70 in, Weight: 380.2 lbs, BMI: 54.5, Temperature: 98.4 F, Pulse: 77 bpm, Respiratory Rate: 24 breaths/min, Blood Pressure: 154/75 mmHg, Capillary Blood Glucose: 183 mg/dl. General Notes: Waited several minutes to allow patient to rest before performing vital signs as he was out of breath ambulating with walker from waiting area to room. Provider made aware of SHOB. Respiratory labored, rapid respiration. Cardiovascular 2+ dorsalis pedis/posterior tibialis pulses. Psychiatric this patient is able to make decisions and demonstrates good insight into disease process. Alert and Oriented x 3. pleasant and cooperative. General Notes: Upon  inspection patient's wound appears to be doing well unfortunately his breathing is not doing nearly as well. He is very labored with regard to respirations and that does have me concerned to be perfectly honest. I did discuss with him today that he may need to go to the ER for further evaluation and treatment. I feel like he may be fluid overloaded which is a big portion of the issue here as well. Nonetheless he does not seem very likely that he is going to go but I really feel like this would be ideal to be honest. Integumentary (Hair, Skin) Wound #204 status is Open. Original cause of wound was Gradually Appeared. The date acquired was: 09/26/2021. The wound has been in treatment 15 weeks. The wound is located on the Left T Second. The wound measures 0.5cm length x 0.5cm width x 0.1cm depth; 0.196cm^2 area and 0.02cm^3 volume. There is oe Fat Layer (Subcutaneous Tissue) exposed. There is no tunneling or undermining noted. There is a medium amount of serous drainage noted. The wound margin is distinct with the outline attached to the wound base. There is small (1-33%) pink granulation within the wound bed. There is  no necrotic tissue within the wound bed. Wound #211 status is Open. Original cause of wound was Gradually Appeared. The date acquired was: 12/05/2021. The wound has been in treatment 5 weeks. The wound is located on the Left,Anterior Ankle. The wound measures 0.4cm length x 0.6cm width x 0.1cm depth; 0.188cm^2 area and 0.019cm^3 volume. There is Fat Layer (Subcutaneous Tissue) exposed. There is no tunneling or undermining noted. There is a small amount of serosanguineous drainage noted. The wound margin is flat and intact. There is large (67-100%) red granulation within the wound bed. There is no necrotic tissue within the wound bed. Wound #214 status is Open. Original cause of wound was Shear/Friction. The date acquired was: 12/26/2021. The wound has been in treatment 2 weeks. The wound is  located on the Left,Posterior T Second. The wound measures 0.2cm length x 1cm width x 0.1cm depth; 0.157cm^2 area and 0.016cm^3 volume. oe There is Fat Layer (Subcutaneous Tissue) exposed. There is no tunneling or undermining noted. There is a medium amount of serosanguineous drainage noted. The wound margin is flat and intact. There is small (1-33%) red, pink granulation within the wound bed. There is a large (67-100%) amount of necrotic tissue within the wound bed including Eschar. Wound #215 status is Open. Original cause of wound was Blister. The date acquired was: 01/09/2022. The wound is located on the Left T Fourth. The wound oe measures 0.8cm length x 0.8cm width x 0.1cm depth; 0.503cm^2 area and 0.05cm^3 volume. There is Fat Layer (Subcutaneous Tissue) exposed. There is no tunneling or undermining noted. There is a medium amount of serosanguineous drainage noted. The wound margin is distinct with the outline attached to the wound base. There is large (67-100%) red, pink granulation within the wound bed. There is no necrotic tissue within the wound bed. 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 There was a Four  Layer Compression Therapy Procedure by Shawn Stall, RN. Post procedure Diagnosis Wound #211: Same as Pre-Procedure There was a Four Layer Compression Therapy Procedure by Shawn Stall, RN. Post procedure Diagnosis Wound #: Same as Pre-Procedure Plan Follow-up Appointments: Return Appointment in 1 week. - Wednesday Dr. Lady Gary and Yvonne Kendall Other: - ***Advise you to go to the emergency department related to labored breathing with possible fluid overload.*** ***Ensure to follow up with Cardiology as well related to labored breathing and fluid overload.** Patient to follow up with Dr. Marlowe Sax at [facility] regarding weight loss. Bathing/ Shower/ Hygiene: May shower with protection but do not get wound dressing(s) wet. Edema Control - Lymphedema / SCD / Other: Lymphedema Pumps. Use Lymphedema pumps on leg(s) 2-3 times a day for 45-60 minutes. If wearing any wraps or hose, do not remove them. Continue exercising as instructed. Elevate legs to the level of the heart or above for 30 minutes daily and/or when sitting,  a frequency of: - throughout the day Avoid standing for long periods of time. Exercise regularly Other Edema Control Orders/Instructions: - right leg 4 layer compression with unna boot to secure to upper portion of lower leg. apply lotion to right leg. WOUND #204: - T Second Wound Laterality: Left oe Peri-Wound Care: Zinc Oxide Ointment 30g tube 1 x Per Week/30 Days Discharge Instructions: Apply Zinc Oxide to periwound with each dressing change Secondary Dressing: Woven Gauze Sponges 2x2 in 1 x Per Week/30 Days Discharge Instructions: Apply over primary dressing as directed. WOUND #211: - Ankle Wound Laterality: Left, Anterior Peri-Wound Care: Sween Lotion (Moisturizing lotion) 1 x Per Week/30 Days Discharge Instructions: Apply moisturizing lotion as directed Prim Dressing: KerraCel Ag Gelling Fiber Dressing, 2x2 in (silver alginate) 1 x Per Week/30 Days ary Discharge  Instructions: Apply silver alginate to wound bed as instructed Secondary Dressing: Woven Gauze Sponge, Non-Sterile 4x4 in 1 x Per Week/30 Days Discharge Instructions: Apply over primary dressing as directed. Secondary Dressing: ABD Pad, 8x10 1 x Per Week/30 Days Discharge Instructions: Apply over primary dressing as directed. Com pression Wrap: FourPress (4 layer compression wrap) 1 x Per Week/30 Days Discharge Instructions: Apply four layer compression . unna layer at top to secure wrap WOUND #214: - T Second Wound Laterality: Left, Posterior oe Cleanser: Soap and Water 1 x Per Day/30 Days Discharge Instructions: May shower and wash wound with dial antibacterial soap and water prior to dressing change. Peri-Wound Care: Zinc Oxide Ointment 30g tube 1 x Per Day/30 Days Discharge Instructions: Apply Zinc Oxide to periwound with each dressing change Secondary Dressing: Woven Gauze Sponges 2x2 in 1 x Per Day/30 Days Discharge Instructions: Apply 2x2 gauze rolled over zinc oxide WOUND #215: - T Fourth Wound Laterality: Left oe Cleanser: Soap and Water 1 x Per Day/30 Days Discharge Instructions: May shower and wash wound with dial antibacterial soap and water prior to dressing change. Peri-Wound Care: Zinc Oxide Ointment 30g tube 1 x Per Day/30 Days Discharge Instructions: Apply Zinc Oxide to periwound with each dressing change Secondary Dressing: Woven Gauze Sponges 2x2 in 1 x Per Day/30 Days Discharge Instructions: Apply 2x2 gauze rolled over zinc oxide 1. Would recommend currently that we going continue with the wound care measures as before and the patient is in agreement with plan. This includes the use of the 4-layer compression wraps bilaterally which I think is doing a good job. 2. We will use a silver alginate to any open wound locations. 3. I am also can recommend the patient should be elevating his legs much as possible he should also be taking his fluid pills he is only been doing it  once a day supposed to be doing it twice a day I think this may have something to do with his breathing as well although in general I am afraid that with regard to his heart failure he may be overloaded. He does tell me that he has an appointment with his cardiologist next week he thinks. I recommended that he contact them to confirm and then make any adjustments as he needs to going forward as far as get an appointment if he does not have 1. We will see patient back for reevaluation in 1 week here in the clinic. If anything worsens or changes patient will contact our office for additional recommendations. Electronic Signature(s) Signed: 01/09/2022 11:34:08 AM By: Lenda Kelp PA-C Entered By: Lenda Kelp on 01/09/2022 11:34:08 -------------------------------------------------------------------------------- SuperBill Details Patient Name: Date of Service: CO  Kevin, Jivan J. 01/09/2022 Medical Record Number: [id number] Patient Account Number: [id number] Date of Birth/Sex: Treating RN: June 16, 1951 (71 y.o. [name], [name] Primary Care Provider: Nicoletta Ba Other Clinician: Referring Provider: Treating Provider/Extender: Adele Dan in Treatment: 311 Diagnosis Coding ICD-10 Codes Code Description [phone] Non-pressure chronic ulcer of other part of left foot limited to breakdown of skin L97.829 Non-pressure chronic ulcer of other part of left lower leg with unspecified severity L97.519 Non-pressure chronic ulcer of other part of right foot with unspecified severity L97.812 Non-pressure chronic ulcer of other part of right lower leg with fat layer exposed I87.333 Chronic venous hypertension (idiopathic) with ulcer and inflammation of bilateral lower extremity L03.116 Cellulitis of left lower limb I89.0 Lymphedema, not elsewhere classified E11.40 Type 2 diabetes mellitus with diabetic neuropathy, unspecified E11.622 Type 2 diabetes mellitus with other skin  ulcer L03.115 Cellulitis of right lower limb Facility Procedures CPT4: Code [id number] Description: 81 BILATERAL: Application of multi-layer venous compression system; leg (below knee), including ankle and t. Modifier: Quantity: 1 Physician Procedures : CPT4 Code Description Modifier [id number] - WC PHYS LEVEL 4 - EST PT ICD-10 Diagnosis Description L97.521 Non-pressure chronic ulcer of other part of left foot limited to breakdown of skin L97.829 Non-pressure chronic ulcer of other part of left  lower leg with unspecified severity L97.519 Non-pressure chronic ulcer of other part of right foot with unspecified severity L97.812 Non-pressure chronic ulcer of other part of right lower leg with fat layer exposed Quantity: 1 Electronic Signature(s) Signed: 01/09/2022 11:37:23 AM By: Lenda Kelp PA-C Entered By: Lenda Kelp on 01/09/2022 11:37:17

## 2022-01-09 NOTE — Progress Notes (Signed)
OFFICE VISIT  01/09/2022  CC:  Chief Complaint  Patient presents with   Diabetes   Hypertension    Pt is not fasting   Chronic Kidney Disease    HPI:    Patient is a 71 y.o. male who presents for 3 mo f/u DM 2, HTN, CRI III. 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 He has declined antidepressant.  He has established with a Social worker.  We will have to see how his current social situation plays out.  Emotional support given today.   #2 hypertension.  Home measurements consistently high.  However at wound clinic earlier today his blood pressure was 138/66.  Today in office his blood pressure is 123/56. I recommended he continue current blood pressure medication regimen and he can cut back his home checks to as needed if feeling bad.   #3 chronic lymphedema bilateral lower legs. Wound clinic for regular changing of Unna boots. Lasix 80 mg daily. Checking electrolytes and creatinine today.  4.  Chronic renal insufficiency stage III.  With current med regimen are certainly keeping a close eye on this.  Electrolytes and creatinine today.   5. diabetes type 2, with nephropathy. Continue 75/25 insulin twice daily and metformin 1000 mg twice a day.  Last GFR was 45 but if it drops below this consistently we will have to get him off metformin.   6. preventative health care : colon ca screening: positive cologuard 02/2021-->referred to GI but pt has not been ready to schedule yet. Prostate cancer screening last PSA normal 3 months ago. Vaccines: Tdap ->deferred for now.  Shingrix->deferred for now."  INTERIM HX: [name] feels well. Unfortunately he did end up getting evicted and he has been living in the [employer] and in [place] for the last couple months.  Checking glucoses regularly, these vary quite a bit based on what he eats.  He has a leeway to adjust his any 75/25 insulin twice a day.  He stopped clonidine due to it causing dry mouth.  He has  noticed home blood pressure still be fine.  His blood pressures weekly at the wound care center are consistently around 130/80 or better.  No recent changes in lower legs--still going to the wound center and getting Unna boot changes weekly.  He remains on Lasix 80 mg once a day.  ROS as above, plus--> no fevers, no CP, no SOB, no wheezing, no cough, no dizziness, no HAs, no rashes, no melena/hematochezia.  No polyuria or polydipsia.  No myalgias or arthralgias.  No focal weakness, paresthesias, or tremors.  No acute vision or hearing abnormalities.  No dysuria or unusual/new urinary urgency or frequency.  No recent changes in lower legs. No n/v/d or abd pain.  No palpitations.    Past Medical History:  Diagnosis Date   ALLERGIC RHINITIS 08/11/2006   ASTHMA 08/11/2006   Bradycardia    Beta blocker d/c'd 2022   Chronic atrial fibrillation ([place]) 08/2014   Chronic combined systolic and diastolic CHF (congestive heart failure) [facility])    Cardiology f/u 12/2017: pt volume overloaded (R heart dysf suspected), BNP very high, lasix increased.  Repeat echo 12/2017: normal LV EF, mild DD, +RV syst dysfxn, mod pulm HTN, biatrial enlgmt.   Chronic constipation    Chronic renal insufficiency, stage 3 (moderate) (HCC)    Colon cancer screening 02/2021   02/2021 Cologuard POS->GI ref   DIABETES MELLITUS, TYPE II 08/11/2006   HYPERTENSION 08/11/2006   Impaired  mobility and endurance    MRSA infection 06/2018   LL venous stasis ulcer infected   Normocytic anemia 2016-2019   03/2018 B12 normal, iron ok (ferritin borderline low).   OBESITY, MORBID 12/14/2007   saxenda started 05/2020 by [facility] wt mgmt center   OSA (obstructive sleep apnea)    to get sleep study with [facility]) as of 12/03/2018 consult.   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  [id number] ID started him on amoxil prophylaxis---ineffective.  End 2018/Jan 2019  penicillin G injections prophyl helpful but pt declined to continue this as of 01/2018 ID f/u.  ID talked him into resuming monthly penicillin G as of 02/2018 f/u.     Restless leg syndrome    Rx'd clonazepam 09/2017 and pt refused to take it after reading the medication's potential side effects.   Venous stasis ulcers of both lower extremities (HCC)    Severe lymphedema.  wound clinic care ongoing as of 01/2018    Past Surgical History:  Procedure Laterality Date   Carotid dopplers  07/23/2018   Left NORMAL.  Right 1-39% ICA stenosis, with <50% distal CCA stenosis (not hemodynamically significant)   TRANSTHORACIC ECHOCARDIOGRAM  11/2007; 09/2014; 11/2015;12/2017   LV fxn normal, EF normal, mild dilation of left atrium.  2015 grade II diast dysfxn.  2017 EF 55-60%. 2019: LVEF 60-65%, mild RV syst dysf,biatrial enlgmt, mod pulm htn.   URETERAL STENT PLACEMENT     virtual colonoscopy  01/2011   Normal    Outpatient Medications Prior to Visit  Medication Sig Dispense Refill   amLODipine (NORVASC) 10 MG tablet Take 1 tablet (10 mg total) by mouth daily. 90 tablet 3   arginine 500 MG tablet Take by mouth.     b complex vitamins capsule Take 1 capsule by mouth every morning.     cetirizine (ZYRTEC) 10 MG tablet Take 10 mg by mouth daily.     CHROMIUM GTF PO Take by mouth daily.     Coenzyme Q10 (CO Q 10) 10 MG CAPS Take by mouth. Reported on 02/19/2016     diltiazem (TIAZAC) 120 MG 24 hr capsule Take 1 capsule (120 mg total) by mouth daily. 90 capsule 3   furosemide (LASIX) 40 MG tablet TAKE 2 TABLETS BY MOUTH  TWICE DAILY 360 tablet 1   glucose blood (ONETOUCH ULTRA) test strip CHECK BLOOD SUGAR TWICE  DAILY 200 strip 0   Glutamine 500 MG CAPS Take by mouth.     HYDROcodone-acetaminophen (NORCO/VICODIN) 5-325 MG tablet 1-2 tabs po bid prn pain 60 tablet 0   Insulin Lispro Prot & Lispro (HUMALOG MIX 75/25 KWIKPEN) (75-25) 100 UNIT/ML Kwikpen INJECT SUBCUTANEOUSLY 25  UNITS EVERY MORNING AND 20   UNITS EVERY EVENING AT  SUPPER 45 mL 3   Insulin Pen Needle (B-D ULTRAFINE III SHORT PEN) 31G X 8 MM MISC USE 1 PENNEEDLE TWICE DAILY 200 each 1   isosorbide mononitrate (IMDUR) 30 MG 24 hr tablet Take 1 tablet (30 mg total) by mouth daily. 90 tablet 3   metFORMIN (GLUCOPHAGE) 1000 MG tablet TAKE 1 TABLET BY MOUTH  TWICE DAILY WITH MEALS 180 tablet 0   Multiple Vitamin (MULTIVITAMIN) tablet Take 1 tablet by mouth daily.     rivaroxaban (XARELTO) 20 MG TABS tablet TAKE 1 TABLET BY MOUTH ONCE DAILY WITH SUPPER 90 tablet 3   terazosin (HYTRIN) 10 MG capsule TAKE 1 CAPSULE BY MOUTH  ONCE DAILY  AT BEDTIME 90 capsule 3   valsartan (DIOVAN) 320 MG tablet Take 1 tablet (320 mg total) by mouth daily. 90 tablet 3   vitamin E 400 UNIT capsule Take 400 Units by mouth every morning. Selenium 50mg      butalbital-acetaminophen-caffeine (FIORICET WITH CODEINE) 50-325-40-30 MG capsule TAKE 1 TO 2 CAPSULES BY MOUTH EVERY 6 HOURS AS NEEDED FOR HEADACHES. NOT TO EXCEED 6 CAPSULES PER DAY (Patient not taking: Reported on 01/09/2022) 60 capsule 5   cloNIDine (CATAPRES) 0.3 MG tablet Take 1 tablet (0.3 mg total) by mouth 3 (three) times daily. (Patient not taking: Reported on 01/09/2022) 90 tablet 3   No facility-administered medications prior to visit.    Allergies  Allergen Reactions   Other Other (See Comments)    Sneezing, coughing   Hydralazine Other (See Comments)    Drowsiness/sedation/mental fog    ROS As per HPI  PE: Vitals with BMI 01/09/2022 10/10/2021 09/19/2021  Height 5\' 10"  5\' 10"  5\' 10"   Weight 399 lbs 10 oz 392 lbs 3 oz 395 lbs  BMI 57.34 [id number]  Systolic [id number]  Diastolic 52 56 60  Pulse 77 72 65  02 sat 90% RA today after walking into exam room. Went up to 94% after sitting in room a while  Physical Exam  Gen: Alert, well appearing.  Patient is oriented to person, place, time, and situation. AFFECT: pleasant, lucid thought and speech. CV: RRR, no m/r/g.   LUNGS: CTA bilat,  nonlabored resps, good aeration in all lung fields. EXT: no clubbing or cyanosis. Edema not assessable d/t unna boots in place.    LABS:  Last CBC Lab Results  Component Value Date   WBC 8.1 04/04/2021   HGB 13.4 04/04/2021   HCT 43.6 04/04/2021   MCV 84.8 04/04/2021   MCH 26.1 (L) 04/04/2021   RDW 15.3 (H) 04/04/2021   PLT 236 [id number]   Last metabolic panel Lab Results  Component Value Date   GLUCOSE 184 (H) 10/10/2021   NA 137 10/10/2021   K 4.6 10/10/2021   CL 100 10/10/2021   CO2 28 10/10/2021   BUN 29 (H) 10/10/2021   CREATININE 1.73 (H) 10/10/2021   GFRNONAA 55 (L) 02/09/2020   CALCIUM 8.7 10/10/2021   PROT 6.7 10/10/2021   ALBUMIN 3.6 10/10/2021   BILITOT 0.6 10/10/2021   ALKPHOS 81 10/10/2021   AST 13 10/10/2021   ALT 11 10/10/2021   ANIONGAP 10 12/02/2016   Lab Results  Component Value Date   HGBA1C 7.9 (H) 10/10/2021   Lab Results  Component Value Date   CHOL 101 04/04/2021   HDL 41 04/04/2021   LDLCALC 46 04/04/2021   TRIG 61 04/04/2021   CHOLHDL 2.5 04/04/2021   Lab Results  Component Value Date   TSH 6.21 (H) 09/19/2021   IMPRESSION AND PLAN:  #1 diabetes type 2. Continue insulin 75/25--he has leeway to adjust dose as appropriate.  Continue metformin 1000 mg twice daily. Hemoglobin A1c and renal function checked today.  #2 hypertension, well controlled on amlodipine 10 mg a day, valsartan 320 mg a day.  3.  Chronic renal insufficiency stage III: With current med regimen are certainly keeping a close eye on this.  Electrolytes and creatinine today.  An After Visit Summary was printed and given to the patient.  FOLLOW UP: Return in about 3 months (around 04/11/2022) for annual CPE (fasting). Next cpe 3 mo  Signed:  Crissie Sickles, MD  01/09/2022 ° °

## 2022-01-10 NOTE — Congregational Nurse Program (Signed)
TC to discuss [employer].  Got the name of the manager on duty and phone number for [employer].  Spoke to [employer] (on [employer]).  She referred me to [employer] as Armed forces technical officer.  Rent for 1 week beginning next week is $450 per week.  Completed fund request and emailed to [employer].  Will follow up with him as needed.  Juliann Pulse, RN  [phone] ?

## 2022-01-12 NOTE — Progress Notes (Signed)
Tc to client, not answering phone, Juliann Pulse, RN Congregational nurse, [phone]

## 2022-01-12 NOTE — Congregational Nurse Program (Signed)
Text x 1, HV --Picked up food from [place] and went by [employer], dropped off food.  He had been out this morning but was now back in room.  Had some good news that only thing needed for his move in was the approval of county inspector to ok occupancy Hopeful he can get in by April.  He was tearful about his situation .  Offered client ability to vent about his situation.  Was appreciative about the food and my visit.   Juliann Pulse, RN  ? ?

## 2022-01-15 ENCOUNTER — Other Ambulatory Visit: Payer: Self-pay

## 2022-01-15 ENCOUNTER — Ambulatory Visit (HOSPITAL_BASED_OUTPATIENT_CLINIC_OR_DEPARTMENT_OTHER): Payer: Medicare Other | Admitting: Internal Medicine

## 2022-01-15 ENCOUNTER — Encounter (HOSPITAL_BASED_OUTPATIENT_CLINIC_OR_DEPARTMENT_OTHER): Payer: Self-pay | Admitting: Internal Medicine

## 2022-01-15 VITALS — BP 122/56 | HR 79 | Ht 69.05 in | Wt >= 6400 oz

## 2022-01-15 DIAGNOSIS — I4821 Permanent atrial fibrillation: Secondary | ICD-10-CM | POA: Diagnosis not present

## 2022-01-15 DIAGNOSIS — I5042 Chronic combined systolic (congestive) and diastolic (congestive) heart failure: Secondary | ICD-10-CM

## 2022-01-15 NOTE — Progress Notes (Signed)
? ? ?OFFICE NOTE ? ?Chief Complaint:  ?Leg swelling ? ?Primary Care Physician: ?McGowen, Adrian Blackwater, MD ? ?HPI:  ?[name] is a 71 year old male who is currently referred by Dr. Anitra Lauth for new onset atrial flutter/fibrillation. [name] was at his office for a routine physical and underwent an EKG. This demonstrated either coarse A. Fib or atrial flutter with variable ventricular response and a rate around 80. It was noted that he was already on metoprolol tartrate 75 mg twice daily for hypertension. 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  He is reportedly unaware of his atrial fibrillation. Fortunately an EKG in the office today shows that he is back in a sinus rhythm or sinus arrhythmia. There is no family history of coronary disease or A. Fib. He denies any chest pain or worsening shortness of breath, but has been having problems with sciatic pain in his back and difficulty ambulating. He reports he gets good sleep at night, rarely wakes up, does not have morning headaches or the need for daytime napping. His screening EPWSS was only 3, suggesting that obstructive sleep apnea is actually fairly unlikely, despite his significant weight. ? ?[name] returns today for follow-up. He underwent a nuclear stress test which was a 2 day study. This demonstrated significant TID 1.48, however it is unclear how reliable this could be because of the fact the study was done on 2 different days. The test was negative for ischemia. EF was calculated at 42%, which is lower than his EF was thought to be at 50%. He denies any chest pain, ever. He also has stable shortness of breath but no worsening symptoms. He reportedly was unaware of A. fib and still has not had any symptoms. He is currently on Xarelto and is not having any bleeding problems. ? ?[name] returns today for follow-up. He reports some  recent worsening of shortness of breath on exertion. He saw Dr. Anitra Lauth for this and had some increase in his Lasix. Up to 80 mg daily. He is currently taking 40 mg daily does not notice significant difference in his symptoms. He had a recent echocardiogram which shows an improvement in LV function up to 55-60%. He feels some of this is due to excess weight. He's been less active. He is now close to 400 pounds. Has pain. He has recurrent atrial fibrillation and is in A. fib today to rate is 78. This is been paroxysmal in the past however may be more persistent. He is not aware of his A. fib. He is on Xarelto and is been compliant with that medication. He denies any bleeding problems.  ? ?12/24/2016 ? ?[name] returns to for follow-up. Initially blood pressure was elevated 166/60 became and 132/64. He has managed to lose about 15 pounds which I commended him on. He denies any chest pain or worsening shortness of breath. He remains in atrial fibrillation with controlled ventricular response of 61. He denies any bleeding problems on Xarelto. ? ?12/15/2017 ? ?[name] returns today for annual follow-up.  He has been struggling with lower extremity wound infections.  He is undergoing penicillin injections and wound care management.  He denies any chest pain or worsening shortness of breath however has had about a 12 pound weight gain since I last saw him a year ago.  He says he is going to be working on dietary changes however he is noted  significant swelling of his legs.  There was a suggestion that he may be volume overloaded and he does report some symptoms that sound consistent with orthopnea.  He last had an echo which showed systolic function and 0000000. ? ?01/16/2018 ? ?[name] was seen today in follow-up.  He reports improvement in his edema.  I increased his Lasix to 80 mg twice daily.  He is lost about 4 pounds since I last saw him.  He denies any worsening shortness of breath.  He continues to struggle with  lower extremity wounds that are slowly healing.  Weight is a significant issue.  An echo was performed which showed normal systolic function and mild RV dysfunction.  Most of his symptoms are likely attributable to that. ? ?11/26/2018 ? ?[name] is seen today in follow-up.  Overall he is doing well.  Recently he is lost significant amount of weight.  He was seen by the wound care center who noted that he was put tensive and had a low heart rate.  His diltiazem was decreased from 360 mg to 240 mg daily and his metoprolol was decreased from 75 mg twice daily to 50 mg twice daily.  Today's blood pressure is improved however heart rate remains low at 44.  He seems to be asymptomatic with this.  Denies any worsening shortness of breath or chest pain. ? ?07/05/2019 ? ?[name] returns today for follow-up.  He continues to get care in the wound care center.  Unfortunately as he had been losing weight the weight has now come back.  He denies any chest pain or worsening shortness of breath.  His blood pressure appears to be well controlled at home although is a little elevated today.  He has had no significant's symptoms related to A. fib but does remain in persistent A. fib which is rate controlled at 67 today. ? ?12/06/2019 ? ?[name] is seen today in follow-up.  He recently has had additional weight gain now up about 10 pounds.  He has had significant swelling.  He is followed very closely at the wound care center however was referred back due to significant volume retention.  Most of this is likely related to right heart failure.  Currently he is supposed to be on 80 mg Lasix twice daily.  After talking with him he said "I only take 2 tablets by mouth daily or 80 mg once daily".. This is of course half the dose that we would recommend. ? ?01/15/2022 ? ?[name] returns today for follow-up.  He continues to struggle with lower extremity edema and poorly healing wounds.  Is followed with the wound care center.   Unfortunately he was evicted from his apartment.  He is on a waiting list for a new apartment now but has been living in a motel at a very high rate.  Does not sound like his diet has been ideal either.  Has had some weight gain and less physical activity.  He cannot tolerate twice daily Lasix but is currently on 80 mg once a day. ? ?PMHx:  ?Past Medical History:  ?Diagnosis Date  ? ALLERGIC RHINITIS 08/11/2006  ? ASTHMA 08/11/2006  ? Bradycardia   ? Beta blocker d/c'd 2022  ? Chronic atrial fibrillation ([place]) 08/2014  ? Chronic combined systolic and diastolic CHF (congestive heart failure) ([place])   ? Cardiology f/u 12/2017: pt volume overloaded (R heart dysf suspected), BNP very high, lasix increased.  Repeat echo 12/2017: normal LV EF, mild DD, +  RV syst dysfxn, mod pulm HTN, biatrial enlgmt.  ? Chronic constipation   ? Chronic renal insufficiency, stage 3 (moderate) ([facility])   ? Colon cancer screening 02/2021  ? 02/2021 Cologuard POS->GI ref  ? DIABETES MELLITUS, TYPE II 08/11/2006  ? HYPERTENSION 08/11/2006  ? Impaired mobility and endurance   ? MRSA infection 06/2018  ? LL venous stasis ulcer infected  ? Normocytic anemia 2016-2019  ? 03/2018 B12 normal, iron ok (ferritin borderline low).  ? OBESITY, MORBID 12/14/2007  ? saxenda started 05/2020 by [facility] wt mgmt center  ? OSA (obstructive sleep apnea)   ? to get sleep study with [facility]) as of 12/03/2018 consult.  ? 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 06/2017 Clindamycin suppression (hx of MRSA) caused diarrhea.  [id number] ID started him on amoxil prophylaxis---ineffective.  End 2018/Jan 2019 penicillin G injections prophyl helpful but pt declined to continue this as of 01/2018 ID f/u.  ID talked him into resuming monthly penicillin G as of 02/2018 f/u.    ? Restless leg syndrome   ? Rx'd clonazepam 09/2017 and pt refused to take it after reading the medication's potential side effects.  ? Venous stasis ulcers of both lower extremities (August)   ?  Severe lymphedema.  wound clinic care ongoing as of 01/2018  ? ? ?Past Surgical History:  ?Procedure Laterality Date  ? Carotid dopplers  07/23/2018  ? Left NORMAL.  Right 1-39% ICA stenosis, with <50% distal CC

## 2022-01-15 NOTE — Patient Instructions (Signed)
Medication Instructions:  ?Your physician recommends that you continue on your current medications as directed. Please refer to the Current Medication list given to you today. ? ?*If you need a refill on your cardiac medications before your next appointment, please call your pharmacy* ? ? ?Follow-Up: ?At [facility], you and your health needs are our priority.  As part of our continuing mission to provide you with exceptional heart care, we have created designated Provider Care Teams.  These Care Teams include your primary Cardiologist (physician) and Advanced Practice Providers (APPs -  Physician Assistants and Nurse Practitioners) who all work together to provide you with the care you need, when you need it. ? ?We recommend signing up for the patient portal called "MyChart".  Sign up information is provided on this After Visit Summary.  MyChart is used to connect with patients for Virtual Visits (Telemedicine).  Patients are able to view lab/test results, encounter notes, upcoming appointments, etc.  Non-urgent messages can be sent to your provider as well.   ?To learn more about what you can do with MyChart, go to [email or web address].   ? ?Your next appointment:   ?1 year with Dr. Hilty  ? ?

## 2022-01-16 ENCOUNTER — Encounter (HOSPITAL_BASED_OUTPATIENT_CLINIC_OR_DEPARTMENT_OTHER): Payer: Medicare Other | Admitting: General Surgery

## 2022-01-16 DIAGNOSIS — L97321 Non-pressure chronic ulcer of left ankle limited to breakdown of skin: Secondary | ICD-10-CM | POA: Diagnosis not present

## 2022-01-16 DIAGNOSIS — L97519 Non-pressure chronic ulcer of other part of right foot with unspecified severity: Secondary | ICD-10-CM | POA: Diagnosis not present

## 2022-01-16 DIAGNOSIS — L97522 Non-pressure chronic ulcer of other part of left foot with fat layer exposed: Secondary | ICD-10-CM | POA: Diagnosis not present

## 2022-01-16 DIAGNOSIS — L97812 Non-pressure chronic ulcer of other part of right lower leg with fat layer exposed: Secondary | ICD-10-CM | POA: Diagnosis not present

## 2022-01-16 DIAGNOSIS — E11621 Type 2 diabetes mellitus with foot ulcer: Secondary | ICD-10-CM | POA: Diagnosis not present

## 2022-01-16 DIAGNOSIS — E11622 Type 2 diabetes mellitus with other skin ulcer: Secondary | ICD-10-CM | POA: Diagnosis not present

## 2022-01-16 DIAGNOSIS — L03116 Cellulitis of left lower limb: Secondary | ICD-10-CM | POA: Diagnosis not present

## 2022-01-16 DIAGNOSIS — I87333 Chronic venous hypertension (idiopathic) with ulcer and inflammation of bilateral lower extremity: Secondary | ICD-10-CM | POA: Diagnosis not present

## 2022-01-16 DIAGNOSIS — L97521 Non-pressure chronic ulcer of other part of left foot limited to breakdown of skin: Secondary | ICD-10-CM | POA: Diagnosis not present

## 2022-01-16 DIAGNOSIS — E1140 Type 2 diabetes mellitus with diabetic neuropathy, unspecified: Secondary | ICD-10-CM | POA: Diagnosis not present

## 2022-01-16 DIAGNOSIS — I890 Lymphedema, not elsewhere classified: Secondary | ICD-10-CM | POA: Diagnosis not present

## 2022-01-16 DIAGNOSIS — L03115 Cellulitis of right lower limb: Secondary | ICD-10-CM | POA: Diagnosis not present

## 2022-01-17 NOTE — Progress Notes (Signed)
[name], BRADNEY ([id number]) ?Visit Report for 01/16/2022 ?Chief Complaint Document Details ?Patient Name: Date of Service: ?[name], [name] 01/16/2022 10:30 A M ?Medical Record Number: [id number] ?Patient Account Number: [id number] ?Date of Birth/Sex: Treating RN: ?[birth year] (71 y.o. M) ?Primary Care Provider: [name] Other Clinician: ?Referring Provider: ?Treating Provider/Extender: Fredirick Maudlin ?[name] ?Weeks in Treatment: 312 ?Information Obtained from: Patient ?Chief Complaint ?Patient is here for evaluation of venous/lymphedema ulcers ?Electronic Signature(s) ?Signed: 01/16/2022 12:57:50 PM By: Fredirick Maudlin MD FACS ?Entered By: Fredirick Maudlin on 01/16/2022 12:57:50 ?-------------------------------------------------------------------------------- ?Debridement Details ?Patient Name: Date of Service: ?[name], Vi J. 01/16/2022 10:30 A M ?Medical Record Number: [id number] ?Patient Account Number: [id number] ?Date of Birth/Sex: Treating RN: ?1950-12-22 (71 y.o. M) [name], [name] ?Primary Care Provider: [name] Other Clinician: ?Referring Provider: ?Treating Provider/Extender: Fredirick Maudlin ?[name] ?Weeks in Treatment: 312 ?Debridement Performed for Assessment: Wound #[id number] Left,Anterior Ankle ?Performed By: Physician Fredirick Maudlin, MD ?Debridement Type: Debridement ?Severity of Tissue Pre Debridement: Limited to breakdown of skin ?Level of Consciousness (Pre-procedure): Awake and Alert ?Pre-procedure Verification/Time Out Yes - 11:40 ?Taken: ?Start Time: 11:41 ?Pain Control: ?Other : benzocaine 20% ?T Area Debrided (L x W): ?otal 0.4 (cm) x 1.5 (cm) = 0.6 (cm?) ?Tissue and other material debrided: ?Non-Viable, Skin: Dermis , Skin: Epidermis ?Level: Skin/Epidermis ?Debridement Description: Selective/Open Wound ?Instrument: Curette ?Bleeding: Minimum ?Hemostasis Achieved: Pressure ?End Time: 11:46 ?Procedural Pain: 0 ?Post Procedural Pain: 0 ?Response to Treatment: Procedure  was tolerated well ?Level of Consciousness (Post- Awake and Alert ?procedure): ?Post Debridement Measurements of Total Wound ?Length: (cm) 0.4 ?Width: (cm) 0.5 ?Depth: (cm) 0.1 ?Volume: (cm?) 0.016 ?Character of Wound/Ulcer Post Debridement: Improved ?Severity of Tissue Post Debridement: Limited to breakdown of skin ?Post Procedure Diagnosis ?Same as Pre-procedure ?Electronic Signature(s) ?Signed: 01/16/2022 6:42:44 PM By: Fredirick Maudlin MD FACS ?Signed: 01/17/2022 6:07:05 PM By: Deon Pilling RN, BSN ?Entered By: Deon Pilling on 01/16/2022 11:46:59 ?-------------------------------------------------------------------------------- ?Debridement Details ?Patient Name: ?Date of Service: ?[name], [name] 01/16/2022 10:30 A M ?Medical Record Number: [id number] ?Patient Account Number: [id number] ?Date of Birth/Sex: ?Treating RN: ?Mar 29, 1951 (71 y.o. M) [name], [name] ?Primary Care Provider: [name] ?Other Clinician: ?Referring Provider: ?Treating Provider/Extender: Fredirick Maudlin ?[name] ?Weeks in Treatment: 312 ?Debridement Performed for Assessment: Wound #204 Left T Second ?oe ?Performed By: Physician Fredirick Maudlin, MD ?Debridement Type: Debridement ?Severity of Tissue Pre Debridement: Limited to breakdown of skin ?Level of Consciousness (Pre-procedure): Awake and Alert ?Pre-procedure Verification/Time Out Yes - 11:40 ?Taken: ?Start Time: 11:41 ?Pain Control: ?Other : benzocaine 20% ?T Area Debrided (L x W): ?otal 1 (cm) x 1.2 (cm) = 1.2 (cm?) ?Tissue and other material debrided: Non-Viable, [place], [place] ?Level: Non-Viable Tissue ?Debridement Description: Selective/Open Wound ?Instrument: Curette ?Bleeding: Minimum ?Hemostasis Achieved: Pressure ?End Time: 11:46 ?Procedural Pain: 0 ?Post Procedural Pain: 0 ?Response to Treatment: Procedure was tolerated well ?Level of Consciousness (Post- Awake and Alert ?procedure): ?Post Debridement Measurements of Total Wound ?Length: (cm) 1 ?Width: (cm)  1.2 ?Depth: (cm) 0.1 ?Volume: (cm?) 0.094 ?Character of Wound/Ulcer Post Debridement: Improved ?Severity of Tissue Post Debridement: Limited to breakdown of skin ?Post Procedure Diagnosis ?Same as Pre-procedure ?Electronic Signature(s) ?Signed: 01/16/2022 6:42:44 PM By: Fredirick Maudlin MD FACS ?Signed: 01/17/2022 6:07:05 PM By: Deon Pilling RN, BSN ?Entered By: Deon Pilling on 01/16/2022 11:47:40 ?-------------------------------------------------------------------------------- ?Debridement Details ?Patient Name: ?Date of Service: ?[name], [name] 01/16/2022 10:30 A M ?Medical Record Number: [id number] ?Patient Account Number: [id number] ?Date of Birth/Sex: ?Treating RN: ?1951-01-17 (71 y.o. M) [name], [name] ?  Primary Care Provider: [name] ?Other Clinician: ?Referring Provider: ?Treating Provider/Extender: Fredirick Maudlin ?[name] ?Weeks in Treatment: 312 ?Debridement Performed for Assessment: Wound #215 Left T Fourth ?oe ?Performed By: Physician Fredirick Maudlin, MD ?Debridement Type: Debridement ?Severity of Tissue Pre Debridement: Fat layer exposed ?Level of Consciousness (Pre-procedure): Awake and Alert ?Pre-procedure Verification/Time Out Yes - 11:40 ?Taken: ?Start Time: 11:41 ?Pain Control: ?Other : benzocaine 20% ?T Area Debrided (L x W): ?otal 0.5 (cm) x 0.4 (cm) = 0.2 (cm?) ?Tissue and other material debrided: ?Viable, Non-Viable, [place], Subcutaneous, Skin: Dermis , Skin: Epidermis, Slough ?Level: Skin/Subcutaneous Tissue ?Debridement Description: Excisional ?Instrument: Curette ?Bleeding: Minimum ?Hemostasis Achieved: Pressure ?End Time: 11:46 ?Procedural Pain: 0 ?Post Procedural Pain: 0 ?Response to Treatment: Procedure was tolerated well ?Level of Consciousness (Post- Awake and Alert ?procedure): ?Post Debridement Measurements of Total Wound ?Length: (cm) 0.5 ?Width: (cm) 0.4 ?Depth: (cm) 0.1 ?Volume: (cm?) 0.016 ?Character of Wound/Ulcer Post Debridement: Improved ?Severity of Tissue Post  Debridement: Fat layer exposed ?Post Procedure Diagnosis ?Same as Pre-procedure ?Electronic Signature(s) ?Signed: 01/16/2022 6:42:44 PM By: Fredirick Maudlin MD FACS ?Signed: 01/17/2022 6:07:05 PM By: Deon Pilling RN, BSN ?Entered By: Deon Pilling on 01/16/2022 11:48:30 ?-------------------------------------------------------------------------------- ?HPI Details ?Patient Name: ?Date of Service: ?[name], Chima J. 01/16/2022 10:30 A M ?Medical Record Number: [id number] ?Patient Account Number: [id number] ?Date of Birth/Sex: ?Treating RN: ?May 10, 1951 (71 y.o. M) ?Primary Care Provider: [name] ?Other Clinician: ?Referring Provider: ?Treating Provider/Extender: Fredirick Maudlin ?[name] ?Weeks in Treatment: 312 ?History of Present Illness ?HPI Description: Referred by PCP for consultation. Patient has long standing history of BLE venous stasis, no prior ulcerations. At beginning of month, ?developed cellulitis and weeping. Received IM Rocephin followed by Keflex and resolved. Wears compression stocking, appr 6 months old. Not sure strength. ?No present drainage. ?01/22/16 this is a patient who is a type II diabetic on insulin. 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 He tells me he ?religiously wears pressure stockings of uncertain strength. He was here with weeping edema about 8 months ago but did not have an open wound. Roughly a ?month ago he had a reopening on his bilateral legs. He is been using bandages and Neosporin. He does not complain of pain. He has chronic atrial fibrillation ?but is not listed as having heart failure although he has renal manifestations of his diabetes he is on Lasix 40 mg. Last BUN/creatinine I have is from 11/20/15 ?at 13 and 1.0 respectively ?01/29/16; patient arrives today having tolerated the Profore wrap. He brought in his stockings and these are 18 mmHg stockings he bought from [place]. The ?compression here is likely inadequate. He does  not complain of pain or excessive drainage she has no systemic symptoms. The wound on the right looks ?improved as does the one on the left although one on the left is more substantial with still tissue at risk

## 2022-01-17 NOTE — Progress Notes (Signed)
KAYMEN, SUPPES ([place]:[id number]) ?Visit Report for 01/16/2022 ?Arrival Information Details ?Patient Name: Date of Service: ?[name], [name] 01/16/2022 10:30 A M ?Medical Record Number: [id number] ?Patient Account Number: [id number] ?Date of Birth/Sex: Treating RN: ?1951/06/29 (71 y.o. [name] ?Primary Care Ojani Berenson: [name] Other Clinician: ?Referring Charisse Wendell: ?Treating Manmeet Arzola/Extender: Fredirick Maudlin ?[name] ?Weeks in Treatment: 312 ?Visit Information History Since Last Visit ?Added or deleted any medications: No ?Patient Arrived: [name] ?Any new allergies or adverse reactions: No ?Arrival Time: 10:52 ?Had a fall or experienced change in No ?Transfer Assistance: None ?activities of daily living that may affect ?Patient Identification Verified: Yes ?risk of falls: ?Secondary Verification Process Completed: Yes ?Signs or symptoms of abuse/neglect since last visito No ?Patient Requires Transmission-Based Precautions: No ?Hospitalized since last visit: No ?Patient Has Alerts: Yes ?Implantable device outside of the clinic excluding No ?cellular tissue based products placed in the center ?since last visit: ?Has Dressing in Place as Prescribed: Yes ?Has Compression in Place as Prescribed: Yes ?Pain Present Now: No ?Electronic Signature(s) ?Signed: 01/16/2022 5:05:04 PM By: Lorrin Jackson ?Entered By: Lorrin Jackson on 01/16/2022 10:52:49 ?-------------------------------------------------------------------------------- ?Compression Therapy Details ?Patient Name: Date of Service: ?[name], [name] 01/16/2022 10:30 A M ?Medical Record Number: [id number] ?Patient Account Number: [id number] ?Date of Birth/Sex: Treating RN: ?[birth year] (71 y.o. M) [name], [name] ?Primary Care Melvina Pangelinan: [name] Other Clinician: ?Referring Primrose Oler: ?Treating Deztinee Lohmeyer/Extender: Fredirick Maudlin ?[name] ?Weeks in Treatment: 312 ?Compression Therapy Performed for Wound Assessment: Wound #216 Right,Medial Lower  Leg ?Performed By: Clinician Deon Pilling, RN ?Compression Type: Four Layer ?Post Procedure Diagnosis ?Same as Pre-procedure ?Electronic Signature(s) ?Signed: 01/17/2022 6:07:05 PM By: Deon Pilling RN, BSN ?Entered By: Deon Pilling on 01/16/2022 11:42:07 ?-------------------------------------------------------------------------------- ?Compression Therapy Details ?Patient Name: ?Date of Service: ?[name], [name] 01/16/2022 10:30 A M ?Medical Record Number: [id number] ?Patient Account Number: [id number] ?Date of Birth/Sex: ?Treating RN: ?[birth year] (71 y.o. M) [name], [name] ?Primary Care Belmont Valli: [name] ?Other Clinician: ?Referring Yoshio Seliga: ?Treating Aragorn Recker/Extender: Fredirick Maudlin ?[name] ?Weeks in Treatment: 312 ?Compression Therapy Performed for Wound Assessment: Wound #211 Left,Anterior Ankle ?Performed By: Clinician Deon Pilling, RN ?Compression Type: Four Layer ?Post Procedure Diagnosis ?Same as Pre-procedure ?Electronic Signature(s) ?Signed: 01/17/2022 6:07:05 PM By: Deon Pilling RN, BSN ?Entered By: Deon Pilling on 01/16/2022 11:42:07 ?-------------------------------------------------------------------------------- ?Encounter Discharge Information Details ?Patient Name: ?Date of Service: ?[name], [name] 01/16/2022 10:30 A M ?Medical Record Number: [id number] ?Patient Account Number: [id number] ?Date of Birth/Sex: ?Treating RN: ?1950-12-15 (71 y.o. M) [name], [name] ?Primary Care Jaylianna Tatlock: [name] ?Other Clinician: ?Referring Yoselyn Mcglade: ?Treating Zissel [name]/Extender: Fredirick Maudlin ?[name] ?Weeks in Treatment: 312 ?Encounter Discharge Information Items Post Procedure Vitals ?Discharge Condition: Stable ?Temperature (F): 97.9 ?Ambulatory Status: [name] ?Pulse (bpm): 73 ?Discharge Destination: Home ?Respiratory Rate (breaths/min): 20 ?Transportation: Private Auto ?Blood Pressure (mmHg): 125/68 ?Accompanied By: self ?Schedule Follow-up Appointment: Yes ?Clinical Summary of  Care: ?Electronic Signature(s) ?Signed: 01/17/2022 6:07:05 PM By: Deon Pilling RN, BSN ?Entered By: Deon Pilling on 01/16/2022 11:50:12 ?-------------------------------------------------------------------------------- ?Lower Extremity Assessment Details ?Patient Name: ?Date of Service: ?[name], [name] 01/16/2022 10:30 A M ?Medical Record Number: [id number] ?Patient Account Number: [id number] ?Date of Birth/Sex: ?Treating RN: ?[birth year] (71 y.o. M) [name], [name] ?Primary Care Plummer Matich: [name] ?Other Clinician: ?Referring Carmita Boom: ?Treating Deaire Mcwhirter/Extender: Fredirick Maudlin ?[name] ?Weeks in Treatment: 312 ?Edema Assessment ?Assessed: [Left: Yes] [Right: No] ?Edema: [Left: Yes] [Right: Yes] ?Calf ?Left: Right: ?Point of Measurement: 25 cm From Medial Instep 43 cm 45 cm ?Ankle ?Left: Right: ?Point of  Measurement: 9 cm From Medial Instep 28 cm 27 cm ?Vascular Assessment ?Pulses: ?Dorsalis Pedis ?Palpable: [Left:Yes] [Right:Yes] ?Electronic Signature(s) ?Signed: 01/17/2022 6:07:05 PM By: Deon Pilling RN, BSN ?Entered By: Deon Pilling on 01/16/2022 11:12:42 ?-------------------------------------------------------------------------------- ?Multi Wound Chart Details ?Patient Name: ?Date of Service: ?[name], [name] 01/16/2022 10:30 A M ?Medical Record Number: [id number] ?Patient Account Number: [id number] ?Date of Birth/Sex: ?Treating RN: ?08-Dec-1950 (71 y.o. M) ?Primary Care Rania Prothero: [name] ?Other Clinician: ?Referring Mckensi Redinger: ?Treating Ronetta Molla/Extender: Fredirick Maudlin ?[name] ?Weeks in Treatment: 312 ?Vital Signs ?Height(in): 70 ?Capillary Blood Glucose(mg/dl): 147 ?Weight(lbs): 380.2 ?Pulse(bpm): 73 ?Body Mass Index(BMI): 54.5 ?Blood Pressure(mmHg): 125/68 ?Temperature(??F): 97.9 ?Respiratory Rate(breaths/min): 22 ?Photos: [204:No Photos Left T Second oe] [211:No Photos Left, Anterior Ankle] [214:No Photos Left, Posterior T Second oe] ?Wound Location: [204:Gradually Appeared]  [211:Gradually Appeared] [214:Shear/Friction] ?Wounding Event: [204:Diabetic Wound/Ulcer of the Lower] [211:Diabetic Wound/Ulcer of the Lower] [214:Diabetic Wound/Ulcer of the Lower] ?Primary Etiology: [204:Extremity N/A] [211:Extremity Lymphedema] [214:Extremity Lymphedema] ?Secondary Etiology: [204:Chronic sinus problems/congestion,] [211:Chronic sinus problems/congestion,] [214:Chronic sinus problems/congestion,] ?Comorbid History: [204:Arrhythmia, Hypertension, Peripheral Arterial Disease, Type II Diabetes, History of Burn, Gout, Confinement Anxiety 09/26/2021] [211:Arrhythmia, Hypertension, Peripheral Arterial Disease, Type II Diabetes, History of Burn, Gout,  ?Confinement Anxiety 12/05/2021] [214:Arrhythmia, Hypertension, Peripheral Arterial Disease, Type II Diabetes, History of Burn, Gout, Confinement Anxiety 12/26/2021] ?Date Acquired: [204:16] [211:6] [214:3] ?Weeks of Treatment: [204:Open] [211:Open] [214:Open] ?Wound Status: [204:No] [211:No] [214:No] ?Wound Recurrence: [204:No] [211:No] [214:Yes] ?Clustered Wound: [204:1x1.2x0.1] [211:0.4x0.5x0.1] [214:0.3x1.5x0.1] ?Measurements L x W x D (cm) [204:0.942] [id number] [214:0.353] ?A (cm?) : ?rea [204:0.094] [211:0.016] [214:0.035] ?Volume (cm?) : [204:95.60%] [211:97.40%] [214:70.00%] ?% Reduction in A rea: [204:98.90%] [211:98.70%] [214:85.20%] ?% Reduction in Volume: [204:Grade 2] [211:Grade 1] [214:Grade 2] ?Classification: [204:Medium] [211:Small] [214:Medium] ?Exudate A mount: [204:Serous] [211:Serosanguineous] [214:Serosanguineous] ?Exudate Type: [204:amber] [211:red, brown] [214:red, brown] ?Exudate Color: [204:Distinct, outline attached] [211:Flat and Intact] [214:Flat and Intact] ?Wound Margin: [204:Small (1-33%)] [211:Large (67-100%)] [214:Small (1-33%)] ?Granulation A mount: [204:Pink] [211:Red] [214:Red, Pink] ?Granulation Quality: [204:Small (1-33%)] [211:Small (1-33%)] [214:Large (67-100%)] ?Necrotic Amount: ?[204:Fat Layer (Subcutaneous  Tissue): Yes Fat Layer (Subcutaneous Tissue): Yes Fat Layer (Subcutaneous Tissue): Yes] ?Exposed Structures: ?[204:Fascia: No Tendon: No Muscle: No Joint: No Bone: No Medium (34-66%)] [211:Fascia: No Tendon: No Mu

## 2022-01-21 ENCOUNTER — Other Ambulatory Visit: Payer: Self-pay | Admitting: Family Medicine

## 2022-01-22 ENCOUNTER — Other Ambulatory Visit: Payer: Self-pay | Admitting: Family Medicine

## 2022-01-23 ENCOUNTER — Encounter (HOSPITAL_BASED_OUTPATIENT_CLINIC_OR_DEPARTMENT_OTHER): Payer: Medicare Other | Admitting: Physician Assistant

## 2022-01-23 ENCOUNTER — Other Ambulatory Visit: Payer: Self-pay

## 2022-01-23 DIAGNOSIS — E11622 Type 2 diabetes mellitus with other skin ulcer: Secondary | ICD-10-CM | POA: Diagnosis not present

## 2022-01-23 DIAGNOSIS — L97519 Non-pressure chronic ulcer of other part of right foot with unspecified severity: Secondary | ICD-10-CM | POA: Diagnosis not present

## 2022-01-23 DIAGNOSIS — I87333 Chronic venous hypertension (idiopathic) with ulcer and inflammation of bilateral lower extremity: Secondary | ICD-10-CM | POA: Diagnosis not present

## 2022-01-23 DIAGNOSIS — I890 Lymphedema, not elsewhere classified: Secondary | ICD-10-CM | POA: Diagnosis not present

## 2022-01-23 DIAGNOSIS — E1140 Type 2 diabetes mellitus with diabetic neuropathy, unspecified: Secondary | ICD-10-CM | POA: Diagnosis not present

## 2022-01-23 DIAGNOSIS — L03115 Cellulitis of right lower limb: Secondary | ICD-10-CM | POA: Diagnosis not present

## 2022-01-23 DIAGNOSIS — L03116 Cellulitis of left lower limb: Secondary | ICD-10-CM | POA: Diagnosis not present

## 2022-01-23 DIAGNOSIS — L97522 Non-pressure chronic ulcer of other part of left foot with fat layer exposed: Secondary | ICD-10-CM | POA: Diagnosis not present

## 2022-01-23 DIAGNOSIS — L97512 Non-pressure chronic ulcer of other part of right foot with fat layer exposed: Secondary | ICD-10-CM | POA: Diagnosis not present

## 2022-01-23 DIAGNOSIS — L97521 Non-pressure chronic ulcer of other part of left foot limited to breakdown of skin: Secondary | ICD-10-CM | POA: Diagnosis not present

## 2022-01-23 DIAGNOSIS — L97812 Non-pressure chronic ulcer of other part of right lower leg with fat layer exposed: Secondary | ICD-10-CM | POA: Diagnosis not present

## 2022-01-23 NOTE — Progress Notes (Addendum)
KHALEEL, BECKOM ([id number]) ?Visit Report for 01/23/2022 ?Chief Complaint Document Details ?Patient Name: Date of Service: ?[name], NICKALOS PETERSEN. 01/23/2022 10:30 A M ?Medical Record Number: [id number] ?Patient Account Number: [id number] ?Date of Birth/Sex: Treating RN: ?Aug 27, 1951 (71 y.o. 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 01/23/2022 10:30 A M ?Medical Record Number: [id number] ?Patient Account Number: [id number] ?Date of Birth/Sex: Treating RN: ?1951/05/29 (71 y.o. M) ?Primary Care Provider: Nicoletta Ba Other Clinician: ?Referring Provider: ?Treating Provider/Extender: Lenda Kelp ?Nicoletta Ba ?Weeks in Treatment: 313 ?History of Present Illness ?HPI Description: Referred by PCP for consultation. Patient has long standing history of BLE venous stasis, no prior ulcerations. At beginning of month, ?developed cellulitis and weeping. Received IM Rocephin followed by Keflex and resolved. Wears compression stocking, appr 6 months old. Not sure strength. ?No present drainage. ?01/22/16 this is a patient who is a type II diabetic on insulin. 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 He tells me he ?religiously wears pressure stockings of uncertain strength. He was here with weeping edema about 8 months ago but did not have an open wound. Roughly a ?month ago he had a reopening on his bilateral legs. He is been using bandages and Neosporin. He does not complain of pain. 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 Last BUN/creatinine I have is from 11/20/15 ?at 13 and 1.0 respectively ?01/29/16; patient arrives today having tolerated the Profore wrap. He brought in his stockings and these are 18 mmHg stockings he bought from [place]. The ?compression here is likely inadequate. He does not complain of pain or excessive drainage she has no systemic symptoms. The wound on the right looks ?improved as does the one on the left although one on the left is more substantial with still tissue at risk below the actual wound area on the bilateral posterior ?calf ?02/05/16; patient arrives with poor edema control. He states that we did put a 4 layer compression on it last week. No weight appear 5 this. ?02/12/16; the area on the posterior right Has healed. The left Has a substantial wound that has necrotic surface eschar that requires a debridement with a ?curette. ?02/16/16;the patient called or a Nurse visit secondary to increased swelling. He had been in earlier in the week with his right leg healed. He was transitioned to is ?on pressure stocking on the right leg with the only open wound on the left, a substantial area on the left posterior calf. Note he has a history of severe lower ?extremity edema, he has a history of chronic atrial fibrillation but not heart failure per my notes but I'll need to research this. He is not complaining of chest ?pain shortness of breath or orthopnea. The intake nurse noted blisters on the previously closed right leg ?02/19/16; this is the patient's regular visit day. I see him on Friday with escalating edema new wounds on the right leg and clear signs of at least right ventricular ?heart failure. I increased his Lasix to 40 twice a day. He is returning  currently in follow-up. States he is noticed a decrease in that the edema ?02/26/16 patient's legs have much less  edema. There is nothing really open on the right leg. The left leg has improved condition of the large superficial wound on ?the posterior left leg ?03/04/16; edema control is very much better. The patient's right leg wounds have healed. On the left leg he continues to have severe venous inflammation on ?the posterior aspect of the left leg. There is no tenderness and I don't think any of this is cellulitis. ?03/11/16; patient's right leg is married healed and he is in his own stocking. The patient's left leg has deteriorated somewhat. There is a lot of erythema around the ?wound on the posterior left leg. There is also a significant rim of erythema posteriorly just above where the wrap would've ended there is a new wound in this ?location and a lot of tenderness. Can't rule out cellulitis in this area. ?03/15/16; patient's right leg remains healed and he is in his own stocking. The patient's left leg is much better than last review. His major wound on the posterior ?aspect of his left Is almost fully epithelialized. He has 3 small injuries from the wraps. Really. Erythema seems a lot better on antibiotics ?03/18/16; right leg remains healed and he is in his own stocking. The patient's left leg is much better. The area on the posterior aspect of the left calf is fully ?epithelialized. His 3 small injuries which were wrap injuries on the left are improved only one seems still open his erythema has resolved ?03/25/16; patient's right leg remains healed and he is in his own stocking. There is no open area today on the left leg posterior leg is completely closed up. His ?wrap injuries at the superior aspect of his leg are also resolved. He looks as though he has some irritation on the dorsal ankle but this is fully epithelialized ?without evidence of infection. ?03/28/16; we discharged this patient on Monday. Transitioned him into his own stocking. There were problems almost immediately with uncontrolled swelling ?weeping edema  multiple some of which have opened. He does not feel systemically unwell in particular no chest pain no shortness of breath and he does not ?feel ?04/08/16; the edema is under better control with the Profore light wrap but he still has pitting edema. There is one large wound anteriorly 2 on the medial aspect of ?his left leg and 3 small areas on the superior posterior calf. Drainage is not excessive he is tolerating a Profore light well ?04/15/16; put a Profore wrap on him last week. This is controlled is edema however he had a lot of pain on his left anterior foot most of his wounds are healed ?04/22/16 once again the patient has denuded areas on the left anterior foot which he states are because his wrap slips up word. He saw his primary physician ?today is on Lasix 40 twice a day and states that he his weight is down 20 pounds over the last 3 months. ?04/29/16: Much improved. left anterior foot much improved. He is now on Lasix 80 mg per day. Much improved edema control ?05/06/16; I was hoping to be able to discharge him today however once again he has blisters at a low level of where the compression was placed last week ?mostly on his left lateral but also his left medial leg and a small area on the anterior part of the left foot. ?05/09/16; apparently the patient went home after his  appointment on 7/4 later in the evening developing pain in his upper medial thigh together with subjective ?fever and chills although his temperature was not taken. The pain was so intense he felt he would probably have to call 911. However he then remembered that ?he had leftover doxycycline from a previous round of antibiotics and took these. By the next morning he felt a lot better. He called and spoke to one of our ?nurses and I approved doxycycline over the phone thinking that this was in relation to the wounds we had previously seen although they were definitely were ?not. The patient feels a lot better old fever no chills he is still  working. Blood sugars are reasonably controlled ?05/13/16; patient is back in for review of his cellulitis on his anterior medial upper thigh. He is taking doxycycline this is a lot better. Culture I

## 2022-01-23 NOTE — Progress Notes (Signed)
FLETCHER, OSTERMILLER ([id number]) ?Visit Report for 01/23/2022 ?Arrival Information Details ?Patient Name: Date of Service: ?[name]. 01/23/2022 10:30 A M ?Medical Record Number: [id number] ?Patient Account Number: [id number] ?Date of Birth/Sex: Treating RN: ?03-13-1951 (71 y.o. M) [name], [name] ?Primary Care Kimbria Camposano: [name] Other Clinician: ?Referring Digna Countess: ?Treating Lindy Garczynski/Extender: Worthy Keeler ?[name] ?Weeks in Treatment: 313 ?Visit Information History Since Last Visit ?Added or deleted any medications: No ?Patient Arrived: [name] ?Any new allergies or adverse reactions: No ?Arrival Time: 10:43 ?Had a fall or experienced change in No ?Accompanied By: self ?activities of daily living that may affect ?Transfer Assistance: None ?risk of falls: ?Patient Identification Verified: Yes ?Signs or symptoms of abuse/neglect since last visito No ?Secondary Verification Process Completed: Yes ?Hospitalized since last visit: No ?Patient Requires Transmission-Based Precautions: No ?Implantable device outside of the clinic excluding No ?Patient Has Alerts: Yes ?cellular tissue based products placed in the center ?since last visit: ?Has Dressing in Place as Prescribed: Yes ?Has Compression in Place as Prescribed: Yes ?Pain Present Now: No ?Electronic Signature(s) ?Signed: 01/23/2022 5:40:22 PM By: Deon Pilling RN, BSN ?Entered By: Deon Pilling on 01/23/2022 11:02:32 ?-------------------------------------------------------------------------------- ?Compression Therapy Details ?Patient Name: Date of Service: ?Kevin [name], [name]. 01/23/2022 10:30 A M ?Medical Record Number: [id number] ?Patient Account Number: [id number] ?Date of Birth/Sex: Treating RN: ?[birth year] (71 y.o. M) [name], [name] ?Primary Care Kenston Longton: [name] Other Clinician: ?Referring Kelton Bultman: ?Treating Elih Mooney/Extender: Worthy Keeler ?[name] ?Weeks in Treatment: 313 ?Compression Therapy Performed for Wound Assessment: Wound  #216 Right,Medial Lower Leg ?Performed By: Clinician Deon Pilling, RN ?Compression Type: Four Layer ?Post Procedure Diagnosis ?Same as Pre-procedure ?Electronic Signature(s) ?Signed: 01/23/2022 5:40:22 PM By: Deon Pilling RN, BSN ?Entered By: Deon Pilling on 01/23/2022 11:11:49 ?-------------------------------------------------------------------------------- ?Compression Therapy Details ?Patient Name: ?Date of Service: ?Kevin [name], [name] 01/23/2022 10:30 A M ?Medical Record Number: [id number] ?Patient Account Number: [id number] ?Date of Birth/Sex: ?Treating RN: ?1951-08-25 (71 y.o. M) [name], [name] ?Primary Care Honestie Kulik: [name] ?Other Clinician: ?Referring Tareva Leske: ?Treating Jessey Huyett/Extender: Worthy Keeler ?[name] ?Weeks in Treatment: 313 ?Compression Therapy Performed for Wound Assessment: NonWound Condition Lymphedema - Left Leg ?Performed By: Clinician Deon Pilling, RN ?Compression Type: Four Layer ?Post Procedure Diagnosis ?Same as Pre-procedure ?Electronic Signature(s) ?Signed: 01/23/2022 5:40:22 PM By: Deon Pilling RN, BSN ?Entered By: Deon Pilling on 01/23/2022 11:12:17 ?-------------------------------------------------------------------------------- ?Encounter Discharge Information Details ?Patient Name: ?Date of Service: ?Kevin [name], [name] 01/23/2022 10:30 A M ?Medical Record Number: [id number] ?Patient Account Number: [id number] ?Date of Birth/Sex: ?Treating RN: ?28-Dec-1950 (71 y.o. M) [name], [name] ?Primary Care Maybree Riling: [name] ?Other Clinician: ?Referring Inman Fettig: ?Treating Kohei Antonellis/Extender: Worthy Keeler ?[name] ?Weeks in Treatment: 313 ?Encounter Discharge Information Items ?Discharge Condition: Stable ?Ambulatory Status: [name] ?Discharge Destination: Home ?Transportation: Private Auto ?Accompanied By: self ?Schedule Follow-up Appointment: Yes ?Clinical Summary of Care: ?Electronic Signature(s) ?Signed: 01/23/2022 5:40:22 PM By: Deon Pilling RN, BSN ?Entered By:  Deon Pilling on 01/23/2022 11:18:13 ?-------------------------------------------------------------------------------- ?Lower Extremity Assessment Details ?Patient Name: ?Date of Service: ?Kevin [name], [name] 01/23/2022 10:30 A M ?Medical Record Number: [id number] ?Patient Account Number: [id number] ?Date of Birth/Sex: ?Treating RN: ?05-11-1951 (71 y.o. M) [name], [name] ?Primary Care Dorin Stooksbury: [name] ?Other Clinician: ?Referring Tanazia Achee: ?Treating Kensi Karr/Extender: Worthy Keeler ?[name] ?Weeks in Treatment: 313 ?Edema Assessment ?Assessed: [Left: Yes] [Right: Yes] ?Edema: [Left: Yes] [Right: Yes] ?Calf ?Left: Right: ?Point of Measurement: 25 cm From Medial Instep 42.5 cm 42 cm ?Ankle ?Left: Right: ?Point of Measurement: 9 cm From Medial  Instep 28 cm 27 cm ?Electronic Signature(s) ?Signed: 01/23/2022 5:40:22 PM By: Deon Pilling RN, BSN ?Entered By: Deon Pilling on 01/23/2022 11:03:15 ?-------------------------------------------------------------------------------- ?Multi-Disciplinary Care Plan Details ?Patient Name: ?Date of Service: ?Kevin [name], [name] 01/23/2022 10:30 A M ?Medical Record Number: [id number] ?Patient Account Number: [id number] ?Date of Birth/Sex: ?Treating RN: ?1951-10-06 (71 y.o. M) [name], [name] ?Primary Care Dmari Schubring: [name] ?Other Clinician: ?Referring Shameer Molstad: ?Treating Shanel Prazak/Extender: Worthy Keeler ?[name] ?Weeks in Treatment: 313 ?Multidisciplinary Care Plan reviewed with physician ?Active Inactive ?Venous Leg Ulcer ?Nursing Diagnoses: ?Actual venous Insuffiency (use after diagnosis is confirmed) ?Goals: ?Patient will maintain optimal edema control ?Date Initiated: 09/10/2016 ?Target Resolution Date: 03/22/2022 ?Goal Status: Active ?Verify adequate tissue perfusion prior to therapeutic compression application ?Date Initiated: 09/10/2016 ?Date Inactivated: 11/28/2016 ?Goal Status: Met ?Interventions: ?Assess peripheral edema status every visit. ?Compression as  ordered ?Provide education on venous insufficiency ?Notes: ?Electronic Signature(s) ?Signed: 01/23/2022 5:40:22 PM By: Deon Pilling RN, BSN ?Entered By: Deon Pilling on 01/23/2022 11:04:25 ?-------------------------------------------------------------------------------- ?Pain Assessment Details ?Patient Name: ?Date of Service: ?Kevin [name], [name] 01/23/2022 10:30 A M ?Medical Record Number: [id number] ?Patient Account Number: [id number] ?Date of Birth/Sex: ?Treating RN: ?[birth year] (71 y.o. M) [name], [name] ?Primary Care Kieley Akter: [name] ?Other Clinician: ?Referring Evana Runnels: ?Treating Porchia Sinkler/Extender: Worthy Keeler ?[name] ?Weeks in Treatment: 313 ?Active Problems ?Location of Pain Severity and Description of Pain ?Patient Has Paino No ?Site Locations ?Rate the pain. ?Current Pain Level: 0 ?Pain Management and Medication ?Current Pain Management: ?Medication: No ?Cold Application: No ?Rest: No ?Massage: No ?Activity: No ?T.E.N.S.: No ?Heat Application: No ?Leg drop or elevation: No ?Is the Current Pain Management Adequate: Adequate ?How does your wound impact your activities of daily livingo ?Sleep: No ?Bathing: No ?Appetite: No ?Relationship With Others: No ?Bladder Continence: No ?Emotions: No ?Bowel Continence: No ?Work: No ?Toileting: No ?Drive: No ?Dressing: No ?Hobbies: No ?Electronic Signature(s) ?Signed: 01/23/2022 5:40:22 PM By: Deon Pilling RN, BSN ?Entered By: Deon Pilling on 01/23/2022 11:02:59 ?-------------------------------------------------------------------------------- ?Patient/Caregiver Education Details ?Patient Name: ?Date of Service: ?Kevin [name], [name] 3/22/2023andnbsp10:30 A M ?Medical Record Number: [id number] ?Patient Account Number: [id number] ?Date of Birth/Gender: ?Treating RN: ?11-Jul-1951 (71 y.o. M) [name], [name] ?Primary Care Physician: [name] ?Other Clinician: ?Referring Physician: ?Treating Physician/Extender: Worthy Keeler ?[name] ?Weeks in  Treatment: 313 ?Education Assessment ?Education Provided To: ?Patient ?Education Topics Provided ?Venous: ?Handouts: Controlling Swelling with Multilayered Compression Wraps ?Methods: Explain/Verbal ?Responses: S

## 2022-01-30 ENCOUNTER — Encounter (HOSPITAL_BASED_OUTPATIENT_CLINIC_OR_DEPARTMENT_OTHER): Payer: Medicare Other | Admitting: Physician Assistant

## 2022-01-30 ENCOUNTER — Other Ambulatory Visit: Payer: Self-pay

## 2022-01-30 DIAGNOSIS — E1140 Type 2 diabetes mellitus with diabetic neuropathy, unspecified: Secondary | ICD-10-CM | POA: Diagnosis not present

## 2022-01-30 DIAGNOSIS — L03115 Cellulitis of right lower limb: Secondary | ICD-10-CM | POA: Diagnosis not present

## 2022-01-30 DIAGNOSIS — E11622 Type 2 diabetes mellitus with other skin ulcer: Secondary | ICD-10-CM | POA: Diagnosis not present

## 2022-01-30 DIAGNOSIS — I890 Lymphedema, not elsewhere classified: Secondary | ICD-10-CM | POA: Diagnosis not present

## 2022-01-30 DIAGNOSIS — L03116 Cellulitis of left lower limb: Secondary | ICD-10-CM | POA: Diagnosis not present

## 2022-01-30 DIAGNOSIS — L97812 Non-pressure chronic ulcer of other part of right lower leg with fat layer exposed: Secondary | ICD-10-CM | POA: Diagnosis not present

## 2022-01-30 DIAGNOSIS — I87333 Chronic venous hypertension (idiopathic) with ulcer and inflammation of bilateral lower extremity: Secondary | ICD-10-CM | POA: Diagnosis not present

## 2022-01-30 DIAGNOSIS — L97521 Non-pressure chronic ulcer of other part of left foot limited to breakdown of skin: Secondary | ICD-10-CM | POA: Diagnosis not present

## 2022-01-30 DIAGNOSIS — L97519 Non-pressure chronic ulcer of other part of right foot with unspecified severity: Secondary | ICD-10-CM | POA: Diagnosis not present

## 2022-01-30 NOTE — Progress Notes (Signed)
[name], [name] ([id number]) ?Visit Report for 01/30/2022 ?SuperBill Details ?Patient Name: Date of Service: ?[name], Huber J. 01/30/2022 ?Medical Record Number: [id number] ?Patient Account Number: [id number] ?Date of Birth/Sex: Treating RN: ?Oct 03, 1951 (71 y.o. 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 ?Electronic Signature(s) ?Signed: 01/30/2022 4:34:38 PM By: Shawn Stall RN, BSN ?Signed: 01/30/2022 5:06:09 PM By: Lenda Kelp PA-C ?Entered By: Shawn Stall on 01/30/2022 12:06:18 ?

## 2022-01-30 NOTE — Progress Notes (Signed)
YUEPHENG, SCHLEETER ([id number]) ?Visit Report for 01/30/2022 ?Arrival Information Details ?Patient Name: Date of Service: ?[name], [name] 01/30/2022 10:30 A M ?Medical Record Number: [id number] ?Patient Account Number: [id number] ?Date of Birth/Sex: Treating RN: ?[birth year] (71 y.o. M) [name], [name] ?Primary Care Johncarlos Holtsclaw: [name] Other Clinician: ?Referring Jenee Spaugh: ?Treating Axyl Sitzman/Extender: Worthy Keeler ?[name] ?Weeks in Treatment: 314 ?Visit Information History Since Last Visit ?Added or deleted any medications: No ?Patient Arrived: [name] ?Any new allergies or adverse reactions: No ?Arrival Time: 10:55 ?Had a fall or experienced change in No ?Accompanied By: self ?activities of daily living that may affect ?Transfer Assistance: None ?risk of falls: ?Patient Identification Verified: Yes ?Signs or symptoms of abuse/neglect since No ?Secondary Verification Process Completed: Yes last visito ?Patient Requires Transmission-Based Precautions: No ?Hospitalized since last visit: No ?Patient Has Alerts: Yes ?Implantable device outside of the clinic No ?excluding ?cellular tissue based products placed in the ?center ?since last visit: ?Has Dressing in Place as Prescribed: Yes ?Has Compression in Place as Prescribed: Yes ?Has Footwear/Offloading in Place as Yes ?Prescribed: ?Left: Surgical Shoe with Pressure Relief Insole ?Right: Surgical Shoe with Pressure Relief Insole ?Pain Present Now: No ?Electronic Signature(s) ?Signed: 01/30/2022 4:34:38 PM By: Deon Pilling RN, BSN ?Entered By: Deon Pilling on 01/30/2022 12:03:01 ?-------------------------------------------------------------------------------- ?Compression Therapy Details ?Patient Name: Date of Service: ?[name], [name] 01/30/2022 10:30 A M ?Medical Record Number: [id number] ?Patient Account Number: [id number] ?Date of Birth/Sex: Treating RN: ?[birth year] (71 y.o. M) [name], [name] ?Primary Care Tisheena Maguire: [name] Other Clinician: ?Referring  Reiley Keisler: ?Treating Vergene Marland/Extender: Worthy Keeler ?[name] ?Weeks in Treatment: 314 ?Compression Therapy Performed for Wound Assessment: Wound #216 Right,Medial Lower Leg ?Performed By: Clinician Deon Pilling, RN ?Compression Type: Four Layer ?Electronic Signature(s) ?Signed: 01/30/2022 4:34:38 PM By: Deon Pilling RN, BSN ?Entered By: Deon Pilling on 01/30/2022 12:04:33 ?-------------------------------------------------------------------------------- ?Compression Therapy Details ?Patient Name: Date of Service: ?[name], [name] 01/30/2022 10:30 A M ?Medical Record Number: [id number] ?Patient Account Number: [id number] ?Date of Birth/Sex: Treating RN: ?23-Feb-1951 (71 y.o. M) [name], [name] ?Primary Care Marceline Napierala: [name] Other Clinician: ?Referring Calyssa [name]: ?Treating Maysoon Lozada/Extender: Worthy Keeler ?[name] ?Weeks in Treatment: 314 ?Compression Therapy Performed for Wound Assessment: NonWound Condition Lymphedema - Left Leg ?Performed By: Clinician Deon Pilling, RN ?Compression Type: Four Layer ?Electronic Signature(s) ?Signed: 01/30/2022 4:34:38 PM By: Deon Pilling RN, BSN ?Entered By: Deon Pilling on 01/30/2022 12:04:53 ?-------------------------------------------------------------------------------- ?Encounter Discharge Information Details ?Patient Name: Date of Service: ?[name], [name] 01/30/2022 10:30 A M ?Medical Record Number: [id number] ?Patient Account Number: [id number] ?Date of Birth/Sex: Treating RN: ?12-21-1950 (71 y.o. M) [name], [name] ?Primary Care Joley Utecht: [name] Other Clinician: ?Referring Mertha Clyatt: ?Treating Davonta Stroot/Extender: Worthy Keeler ?[name] ?Weeks in Treatment: 314 ?Encounter Discharge Information Items ?Discharge Condition: Stable ?Ambulatory Status: [name] ?Discharge Destination: Home ?Transportation: Private Auto ?Accompanied By: self ?Schedule Follow-up Appointment: Yes ?Clinical Summary of Care: ?Notes ?left leg unna boot first layer  upper portion of lower leg, 4 layer compression wrap. ?Electronic Signature(s) ?Signed: 01/30/2022 4:34:38 PM By: Deon Pilling RN, BSN ?Entered By: Deon Pilling on 01/30/2022 12:06:02 ?-------------------------------------------------------------------------------- ?Wound Assessment Details ?Patient Name: Date of Service: ?[name], [name] 01/30/2022 10:30 A M ?Medical Record Number: [id number] ?Patient Account Number: [id number] ?Date of Birth/Sex: Treating RN: ?July 02, 1951 (71 y.o. M) [name], [name] ?Primary Care Cabrini Ruggieri: [name] Other Clinician: ?Referring Tyyne Cliett: ?Treating Ladd Cen/Extender: Worthy Keeler ?[name] ?Weeks in Treatment: 314 ?Wound Status ?Wound Number: [id number] ?Primary Etiology: Diabetic Wound/Ulcer of the Lower Extremity ?  Wound Location: Left T Second ?oe Wound Status: Open ?Wounding Event: Gradually Appeared ?Date Acquired: 09/26/2021 ?Weeks Of Treatment: 18 ?Clustered Wound: No ?Wound Measurements ?Length: (cm) 1.5 ?Width: (cm) 1.5 ?Depth: (cm) 0.1 ?Area: (cm?) 1.767 ?Volume: (cm?) 0.177 ?% Reduction in Area: 91.7% ?% Reduction in Volume: 97.9% ?Wound Description ?Classification: Grade 2 ?Exudate Amount: Medium ?Exudate Type: Serous ?Exudate Color: amber ?Treatment Notes ?Wound #204 (Toe Second) Wound Laterality: Left ?Cleanser ?Soap and Water ?Discharge Instruction: May shower and wash wound with dial antibacterial soap and water prior to dressing change. ?Peri-Wound Care ?Zinc Oxide Ointment 30g tube ?Discharge Instruction: Apply Zinc Oxide to periwound with each dressing change ?Topical ?Primary Dressing ?KerraCel Ag Gelling Fiber Dressing, 2x2 in (silver alginate) ?Discharge Instruction: Apply silver alginate to wound bed as instructed ?Secondary Dressing ?Woven Gauze Sponges 2x2 in ?Discharge Instruction: Apply 2x2 gauze rolled over zinc oxide ?Secured With ?Compression Wrap ?Compression Stockings ?Add-Ons ?Electronic Signature(s) ?Signed: 01/30/2022 4:34:38 PM By: Deon Pilling  RN, BSN ?Entered By: Deon Pilling on 01/30/2022 12:04:03 ?-------------------------------------------------------------------------------- ?Wound Assessment Details ?Patient Name: ?Date of Service: ?[name], [name] 01/30/2022 10:30 A M ?Medical Record Number: [id number] ?Patient Account Number: [id number] ?Date of Birth/Sex: ?Treating RN: ?1951/06/12 (71 y.o. M) [name], [name] ?Primary Care Namish Krise: [name] ?Other Clinician: ?Referring Chelsia Serres: ?Treating Babetta Paterson/Extender: Worthy Keeler ?[name] ?Weeks in Treatment: 314 ?Wound Status ?Wound Number: [id number] ?Primary Etiology: Diabetic Wound/Ulcer of the Lower Extremity ?Wound Location: Left, Posterior T Second ?oe Secondary Etiology: Lymphedema ?Wounding Event: Shear/Friction ?Wound Status: Open ?Date Acquired: 12/26/2021 ?Weeks Of Treatment: 5 ?Clustered Wound: Yes ?Wound Measurements ?Length: (cm) 0.2 ?Width: (cm) 1.1 ?Depth: (cm) 0.1 ?Area: (cm?) 0.173 ?Volume: (cm?) 0.017 ?% Reduction in Area: 85.3% ?% Reduction in Volume: 92.8% ?Wound Description ?Classification: Grade 2 ?Exudate Amount: Medium ?Exudate Type: Serosanguineous ?Exudate Color: red, brown ?Treatment Notes ?Wound #214 (Toe Second) Wound Laterality: Left, Posterior ?Cleanser ?Soap and Water ?Discharge Instruction: May shower and wash wound with dial antibacterial soap and water prior to dressing change. ?Peri-Wound Care ?Zinc Oxide Ointment 30g tube ?Discharge Instruction: Apply Zinc Oxide to periwound with each dressing change ?Topical ?Primary Dressing ?KerraCel Ag Gelling Fiber Dressing, 2x2 in (silver alginate) ?Discharge Instruction: Apply silver alginate to wound bed as instructed ?Secondary Dressing ?Woven Gauze Sponges 2x2 in ?Discharge Instruction: Apply 2x2 gauze rolled over zinc oxide ?Secured With ?Compression Wrap ?Compression Stockings ?Add-Ons ?Electronic Signature(s) ?Signed: 01/30/2022 4:34:38 PM By: Deon Pilling RN, BSN ?Entered By: Deon Pilling on 01/30/2022  12:04:03 ?-------------------------------------------------------------------------------- ?Wound Assessment Details ?Patient Name: ?Date of Service: ?[name], [name] 01/30/2022 10:30 A M ?Medical Record Number: [id number]

## 2022-02-06 ENCOUNTER — Encounter (HOSPITAL_BASED_OUTPATIENT_CLINIC_OR_DEPARTMENT_OTHER): Payer: Medicare Other | Attending: Physician Assistant | Admitting: Physician Assistant

## 2022-02-06 DIAGNOSIS — L97829 Non-pressure chronic ulcer of other part of left lower leg with unspecified severity: Secondary | ICD-10-CM | POA: Diagnosis not present

## 2022-02-06 DIAGNOSIS — L97812 Non-pressure chronic ulcer of other part of right lower leg with fat layer exposed: Secondary | ICD-10-CM | POA: Diagnosis not present

## 2022-02-06 DIAGNOSIS — L97519 Non-pressure chronic ulcer of other part of right foot with unspecified severity: Secondary | ICD-10-CM | POA: Insufficient documentation

## 2022-02-06 DIAGNOSIS — L03116 Cellulitis of left lower limb: Secondary | ICD-10-CM | POA: Insufficient documentation

## 2022-02-06 DIAGNOSIS — I87333 Chronic venous hypertension (idiopathic) with ulcer and inflammation of bilateral lower extremity: Secondary | ICD-10-CM | POA: Insufficient documentation

## 2022-02-06 DIAGNOSIS — E11622 Type 2 diabetes mellitus with other skin ulcer: Secondary | ICD-10-CM | POA: Insufficient documentation

## 2022-02-06 DIAGNOSIS — L97512 Non-pressure chronic ulcer of other part of right foot with fat layer exposed: Secondary | ICD-10-CM | POA: Diagnosis not present

## 2022-02-06 DIAGNOSIS — I890 Lymphedema, not elsewhere classified: Secondary | ICD-10-CM | POA: Diagnosis not present

## 2022-02-06 DIAGNOSIS — L97521 Non-pressure chronic ulcer of other part of left foot limited to breakdown of skin: Secondary | ICD-10-CM | POA: Diagnosis not present

## 2022-02-06 DIAGNOSIS — I872 Venous insufficiency (chronic) (peripheral): Secondary | ICD-10-CM | POA: Diagnosis not present

## 2022-02-06 DIAGNOSIS — I482 Chronic atrial fibrillation, unspecified: Secondary | ICD-10-CM | POA: Insufficient documentation

## 2022-02-06 DIAGNOSIS — E1140 Type 2 diabetes mellitus with diabetic neuropathy, unspecified: Secondary | ICD-10-CM | POA: Diagnosis not present

## 2022-02-06 DIAGNOSIS — L03115 Cellulitis of right lower limb: Secondary | ICD-10-CM | POA: Insufficient documentation

## 2022-02-06 NOTE — Progress Notes (Addendum)
MATTEO, LOOKER ([id number]) ?Visit Report for 02/06/2022 ?Chief Complaint Document Details ?Patient Name: Date of Service: ?[name], [name] 02/06/2022 10:30 A M ?Medical Record Number: [id number] ?Patient Account Number: [id number] ?Date of Birth/Sex: Treating RN: ?[birth year] (71 y.o. 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 02/06/2022 10:30 A M ?Medical Record Number: [id number] ?Patient Account Number: [id number] ?Date of Birth/Sex: Treating RN: ?10-28-1951 (71 y.o. M) ?Primary Care Provider: [name] Other Clinician: ?Referring Provider: ?Treating Provider/Extender: Worthy Keeler ?[name] ?Weeks in Treatment: 315 ?History of Present Illness ?HPI Description: Referred by PCP for consultation. Patient has long standing history of BLE venous stasis, no prior ulcerations. At beginning of month, ?developed cellulitis and weeping. Received IM Rocephin followed by Keflex and resolved. Wears compression stocking, appr 6 months old. Not sure strength. ?No present drainage. ?01/22/16 this is a patient who is a type II diabetic on insulin. 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 He tells me he ?religiously wears pressure stockings of uncertain strength. He was here with weeping edema about 8 months ago but did not have an open wound. Roughly a ?month ago he had a reopening on his bilateral legs. He is been using bandages and Neosporin. He does not complain of pain. 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 Last BUN/creatinine I have is from 11/20/15 ?at 13 and 1.0 respectively ?01/29/16; patient arrives today having tolerated the Profore wrap. He brought in his stockings and these are 18 mmHg stockings he bought from [place]. The ?compression here is likely inadequate. He does not complain of pain or excessive drainage she has no systemic symptoms. The wound on the right looks ?improved as does the one on the left although one on the left is more substantial with still tissue at risk below the actual wound area on the bilateral posterior ?calf ?02/05/16; patient arrives with poor edema control. He states that we did put a 4 layer compression on it last week. No weight appear 5 this. ?02/12/16; the area on the posterior right Has healed. The left Has a substantial wound that has necrotic surface eschar that requires a debridement with a ?curette. ?02/16/16;the patient called or a Nurse visit secondary to increased swelling. He had been in earlier in the week with his right leg healed. He was transitioned to is ?on pressure stocking on the right leg with the only open wound on the left, a substantial area on the left posterior calf. Note he has a history of severe lower ?extremity edema, he has a history of chronic atrial fibrillation but not heart failure per my notes but I'll need to research this. He is not complaining of chest ?pain shortness of breath or orthopnea. The intake nurse noted blisters on the previously closed right leg ?02/19/16; this is the patient's regular visit day. I see him on Friday with escalating edema new wounds on the right leg and clear signs of at least right ventricular ?heart failure. I increased his Lasix to 40 twice a day. He is returning  currently in follow-up. States he is noticed a decrease in that the edema ?02/26/16 patient's legs have much less  edema. There is nothing really open on the right leg. The left leg has improved condition of the large superficial wound on ?the posterior left leg ?03/04/16; edema control is very much better. The patient's right leg wounds have healed. On the left leg he continues to have severe venous inflammation on ?the posterior aspect of the left leg. There is no tenderness and I don't think any of this is cellulitis. ?03/11/16; patient's right leg is married healed and he is in his own stocking. The patient's left leg has deteriorated somewhat. There is a lot of erythema around the ?wound on the posterior left leg. There is also a significant rim of erythema posteriorly just above where the wrap would've ended there is a new wound in this ?location and a lot of tenderness. Can't rule out cellulitis in this area. ?03/15/16; patient's right leg remains healed and he is in his own stocking. The patient's left leg is much better than last review. His major wound on the posterior ?aspect of his left Is almost fully epithelialized. He has 3 small injuries from the wraps. Really. Erythema seems a lot better on antibiotics ?03/18/16; right leg remains healed and he is in his own stocking. The patient's left leg is much better. The area on the posterior aspect of the left calf is fully ?epithelialized. His 3 small injuries which were wrap injuries on the left are improved only one seems still open his erythema has resolved ?03/25/16; patient's right leg remains healed and he is in his own stocking. There is no open area today on the left leg posterior leg is completely closed up. His ?wrap injuries at the superior aspect of his leg are also resolved. He looks as though he has some irritation on the dorsal ankle but this is fully epithelialized ?without evidence of infection. ?03/28/16; we discharged this patient on Monday. Transitioned him into his own stocking. There were problems almost immediately with uncontrolled swelling ?weeping edema  multiple some of which have opened. He does not feel systemically unwell in particular no chest pain no shortness of breath and he does not ?feel ?04/08/16; the edema is under better control with the Profore light wrap but he still has pitting edema. There is one large wound anteriorly 2 on the medial aspect of ?his left leg and 3 small areas on the superior posterior calf. Drainage is not excessive he is tolerating a Profore light well ?04/15/16; put a Profore wrap on him last week. This is controlled is edema however he had a lot of pain on his left anterior foot most of his wounds are healed ?04/22/16 once again the patient has denuded areas on the left anterior foot which he states are because his wrap slips up word. He saw his primary physician ?today is on Lasix 40 twice a day and states that he his weight is down 20 pounds over the last 3 months. ?04/29/16: Much improved. left anterior foot much improved. He is now on Lasix 80 mg per day. Much improved edema control ?05/06/16; I was hoping to be able to discharge him today however once again he has blisters at a low level of where the compression was placed last week ?mostly on his left lateral but also his left medial leg and a small area on the anterior part of the left foot. ?05/09/16; apparently the patient went home after his  appointment on 7/4 later in the evening developing pain in his upper medial thigh together with subjective ?fever and chills although his temperature was not taken. The pain was so intense he felt he would probably have to call 911. However he then remembered that ?he had leftover doxycycline from a previous round of antibiotics and took these. By the next morning he felt a lot better. He called and spoke to one of our ?nurses and I approved doxycycline over the phone thinking that this was in relation to the wounds we had previously seen although they were definitely were ?not. The patient feels a lot better old fever no chills he is still  working. Blood sugars are reasonably controlled ?05/13/16; patient is back in for review of his cellulitis on his anterior medial upper thigh. He is taking doxycycline this is a lot better. Culture I did

## 2022-02-06 NOTE — Progress Notes (Signed)
[name], [name] ([id number]) ?Visit Report for 02/06/2022 ?Arrival Information Details ?Patient Name: Date of Service: ?[name], [name] 02/06/2022 10:30 A M ?Medical Record Number: [id number] ?Patient Account Number: [id number] ?Date of Birth/Sex: Treating RN: ?1951-02-14 (71 y.o. M) [name], [name] ?Primary Care Kalyb Pemble: [name] Other Clinician: ?Referring Cristie Mckinney: ?Treating Mattox Schorr/Extender: Worthy Keeler ?[name] ?Weeks in Treatment: 315 ?Visit Information History Since Last Visit ?Added or deleted any medications: No ?Patient Arrived: [name] ?Any new allergies or adverse reactions: No ?Arrival Time: 10:38 ?Had a fall or experienced change in No ?Accompanied By: self ?activities of daily living that may affect ?Transfer Assistance: None ?risk of falls: ?Patient Identification Verified: Yes ?Signs or symptoms of abuse/neglect since No ?Secondary Verification Process Completed: Yes last visito ?Patient Requires Transmission-Based Precautions: No ?Hospitalized since last visit: No ?Patient Has Alerts: Yes ?Implantable device outside of the clinic No ?excluding ?cellular tissue based products placed in the ?center ?since last visit: ?Has Dressing in Place as Prescribed: Yes ?Has Compression in Place as Prescribed: Yes ?Has Footwear/Offloading in Place as Yes ?Prescribed: ?Left: Surgical Shoe with Pressure Relief Insole ?Right: Surgical Shoe with Pressure Relief Insole ?Pain Present Now: No ?Electronic Signature(s) ?Signed: 02/06/2022 5:22:02 PM By: Deon Pilling RN, BSN ?Entered By: Deon Pilling on 02/06/2022 10:39:04 ?-------------------------------------------------------------------------------- ?Compression Therapy Details ?Patient Name: Date of Service: ?[name], [name] 02/06/2022 10:30 A M ?Medical Record Number: [id number] ?Patient Account Number: [id number] ?Date of Birth/Sex: Treating RN: ?19-Jul-1951 (71 y.o. M) [name], [name] ?Primary Care Jeffery Gammell: [name] Other Clinician: ?Referring  Nasiah Lehenbauer: ?Treating Eiko Mcgowen/Extender: Worthy Keeler ?[name] ?Weeks in Treatment: 315 ?Compression Therapy Performed for Wound Assessment: NonWound Condition Lymphedema - Right Leg ?Performed By: Clinician Deon Pilling, RN ?Compression Type: Four Layer ?Post Procedure Diagnosis ?Same as Pre-procedure ?Electronic Signature(s) ?Signed: 02/06/2022 5:22:02 PM By: Deon Pilling RN, BSN ?Entered By: Deon Pilling on 02/06/2022 11:18:20 ?-------------------------------------------------------------------------------- ?Compression Therapy Details ?Patient Name: ?Date of Service: ?[name], [name] 02/06/2022 10:30 A M ?Medical Record Number: [id number] ?Patient Account Number: [id number] ?Date of Birth/Sex: ?Treating RN: ?[birth year] (71 y.o. M) [name], [name] ?Primary Care Prescilla Monger: [name] ?Other Clinician: ?Referring Rashana Andrew: ?Treating Lambert Jeanty/Extender: Worthy Keeler ?[name] ?Weeks in Treatment: 315 ?Compression Therapy Performed for Wound Assessment: Wound #[id number] Left,Anterior Lower Leg ?Performed By: Clinician Deon Pilling, RN ?Compression Type: Four Layer ?Post Procedure Diagnosis ?Same as Pre-procedure ?Electronic Signature(s) ?Signed: 02/06/2022 5:22:02 PM By: Deon Pilling RN, BSN ?Entered By: Deon Pilling on 02/06/2022 11:18:58 ?-------------------------------------------------------------------------------- ?Encounter Discharge Information Details ?Patient Name: ?Date of Service: ?[name], [name] 02/06/2022 10:30 A M ?Medical Record Number: [id number] ?Patient Account Number: [id number] ?Date of Birth/Sex: ?Treating RN: ?[birth year] (71 y.o. M) [name], [name] ?Primary Care Donavyn Fecher: [name] ?Other Clinician: ?Referring Jyaire Koudelka: ?Treating Kemari Narez/Extender: Worthy Keeler ?[name] ?Weeks in Treatment: 315 ?Encounter Discharge Information Items ?Discharge Condition: Stable ?Ambulatory Status: [name] ?Discharge Destination: Home ?Transportation: Other ?Accompanied By: self ?Schedule  Follow-up Appointment: Yes ?Clinical Summary of Care: ?Electronic Signature(s) ?Signed: 02/06/2022 5:22:02 PM By: Deon Pilling RN, BSN ?Entered By: Deon Pilling on 02/06/2022 11:27:40 ?-------------------------------------------------------------------------------- ?Lower Extremity Assessment Details ?Patient Name: ?Date of Service: ?[name], [name] 02/06/2022 10:30 A M ?Medical Record Number: [id number] ?Patient Account Number: [id number] ?Date of Birth/Sex: ?Treating RN: ?1951-10-03 (72 y.o. M) [name], [name] ?Primary Care Jacarius Handel: [name] ?Other Clinician: ?Referring Cheryl Chay: ?Treating Shalay Carder/Extender: Worthy Keeler ?[name] ?Weeks in Treatment: 315 ?Edema Assessment ?Assessed: [Left: Yes] [Right: Yes] ?Edema: [Left: Yes] [Right: Yes] ?Calf ?Left: Right: ?Point of  Measurement: 25 cm From Medial Instep 45.5 cm 42 cm ?Ankle ?Left: Right: ?Point of Measurement: 9 cm From Medial Instep 26 cm 25 cm ?Electronic Signature(s) ?Signed: 02/06/2022 5:22:02 PM By: Deon Pilling RN, BSN ?Entered By: Deon Pilling on 02/06/2022 10:59:16 ?-------------------------------------------------------------------------------- ?Multi-Disciplinary Care Plan Details ?Patient Name: ?Date of Service: ?[name], [name] 02/06/2022 10:30 A M ?Medical Record Number: [id number] ?Patient Account Number: [id number] ?Date of Birth/Sex: ?Treating RN: ?06/01/1951 (71 y.o. M) [name], [name] ?Primary Care Daelan Gatt: [name] ?Other Clinician: ?Referring Jaasiel Hollyfield: ?Treating Ariyana Faw/Extender: Worthy Keeler ?[name] ?Weeks in Treatment: 315 ?Multidisciplinary Care Plan reviewed with physician ?Active Inactive ?Venous Leg Ulcer ?Nursing Diagnoses: ?Actual venous Insuffiency (use after diagnosis is confirmed) ?Goals: ?Patient will maintain optimal edema control ?Date Initiated: 09/10/2016 ?Target Resolution Date: 04/05/2022 ?Goal Status: Active ?Verify adequate tissue perfusion prior to therapeutic compression application ?Date  Initiated: 09/10/2016 ?Date Inactivated: 11/28/2016 ?Goal Status: Met ?Interventions: ?Assess peripheral edema status every visit. ?Compression as ordered ?Provide education on venous insufficiency ?Notes: ?Electronic Signature(s) ?Signed: 02/06/2022 5:22:02 PM By: Deon Pilling RN, BSN ?Entered By: Deon Pilling on 02/06/2022 11:03:45 ?-------------------------------------------------------------------------------- ?Pain Assessment Details ?Patient Name: ?Date of Service: ?[name], [name] 02/06/2022 10:30 A M ?Medical Record Number: [id number] ?Patient Account Number: [id number] ?Date of Birth/Sex: ?Treating RN: ?06-01-1951 (71 y.o. M) [name], [name] ?Primary Care Shaquon Gropp: [name] ?Other Clinician: ?Referring Kort Stettler: ?Treating Milliani Herrada/Extender: Worthy Keeler ?[name] ?Weeks in Treatment: 315 ?Active Problems ?Location of Pain Severity and Description of Pain ?Patient Has Paino No ?Site Locations ?Rate the pain. ?Current Pain Level: 0 ?Pain Management and Medication ?Current Pain Management: ?Medication: No ?Cold Application: No ?Rest: No ?Massage: No ?Activity: No ?T.E.N.S.: No ?Heat Application: No ?Leg drop or elevation: No ?Is the Current Pain Management Adequate: Adequate ?How does your wound impact your activities of daily livingo ?Sleep: No ?Bathing: No ?Appetite: No ?Relationship With Others: No ?Bladder Continence: No ?Emotions: No ?Bowel Continence: No ?Work: No ?Toileting: No ?Drive: No ?Dressing: No ?Hobbies: No ?Electronic Signature(s) ?Signed: 02/06/2022 5:22:02 PM By: Deon Pilling RN, BSN ?Entered By: Deon Pilling on 02/06/2022 10:39:22 ?-------------------------------------------------------------------------------- ?Patient/Caregiver Education Details ?Patient Name: ?Date of Service: ?[name], [name] 4/5/2023andnbsp10:30 A M ?Medical Record Number: [id number] ?Patient Account Number: [id number] ?Date of Birth/Gender: ?Treating RN: ?10-22-1951 (71 y.o. M) [name], [name] ?Primary Care  Physician: [name] ?Other Clinician: ?Referring Physician: ?Treating Physician/Extender: Worthy Keeler ?[name] ?Weeks in Treatment: 315 ?Education Assessment ?Education Provided To: ?Patient ?Educatio

## 2022-02-09 NOTE — Congregational Nurse Program (Signed)
TC to client, he still has no move in date.  He has been in touch with the Tree surgeon.  He states that there is now a problem with the electricity and will have to be fixed.  This unit [place] and so I am concerned about his being able to get furniture moved.  I will try to make contact with Dominica Severin to see if truck will deliver to [place].  Made call to CN office to see if $35 fee pd.  Received word that Kathreen Cornfield has granted him $1000 for payment of rent at [employer].  Very appreciative of this help.  Got emotional when told of grant.  We will have to work out a way to pay and getting a receipt.  Juliann Pulse, RN

## 2022-02-13 ENCOUNTER — Encounter (HOSPITAL_BASED_OUTPATIENT_CLINIC_OR_DEPARTMENT_OTHER): Payer: Medicare Other | Admitting: Physician Assistant

## 2022-02-13 ENCOUNTER — Inpatient Hospital Stay (HOSPITAL_COMMUNITY)
Admission: EM | Admit: 2022-02-13 | Discharge: 2022-03-07 | DRG: 286 | Disposition: A | Discharge status: Skilled Nursing Facility | Payer: Medicare Other | Attending: Family Medicine | Admitting: Family Medicine

## 2022-02-13 ENCOUNTER — Ambulatory Visit: Payer: Medicare Other

## 2022-02-13 ENCOUNTER — Encounter (HOSPITAL_COMMUNITY): Payer: Self-pay

## 2022-02-13 ENCOUNTER — Other Ambulatory Visit: Payer: Self-pay

## 2022-02-13 ENCOUNTER — Emergency Department (HOSPITAL_COMMUNITY): Payer: Medicare Other

## 2022-02-13 ENCOUNTER — Ambulatory Visit (INDEPENDENT_AMBULATORY_CARE_PROVIDER_SITE_OTHER): Payer: Medicare Other

## 2022-02-13 DIAGNOSIS — Z79899 Other long term (current) drug therapy: Secondary | ICD-10-CM

## 2022-02-13 DIAGNOSIS — Z794 Long term (current) use of insulin: Secondary | ICD-10-CM | POA: Diagnosis not present

## 2022-02-13 DIAGNOSIS — I5043 Acute on chronic combined systolic (congestive) and diastolic (congestive) heart failure: Secondary | ICD-10-CM | POA: Diagnosis not present

## 2022-02-13 DIAGNOSIS — N390 Urinary tract infection, site not specified: Secondary | ICD-10-CM | POA: Diagnosis not present

## 2022-02-13 DIAGNOSIS — E1122 Type 2 diabetes mellitus with diabetic chronic kidney disease: Secondary | ICD-10-CM | POA: Diagnosis not present

## 2022-02-13 DIAGNOSIS — Z0000 Encounter for general adult medical examination without abnormal findings: Secondary | ICD-10-CM | POA: Diagnosis not present

## 2022-02-13 DIAGNOSIS — L03116 Cellulitis of left lower limb: Secondary | ICD-10-CM | POA: Diagnosis not present

## 2022-02-13 DIAGNOSIS — I5082 Biventricular heart failure: Secondary | ICD-10-CM | POA: Diagnosis present

## 2022-02-13 DIAGNOSIS — N189 Chronic kidney disease, unspecified: Secondary | ICD-10-CM | POA: Diagnosis present

## 2022-02-13 DIAGNOSIS — Z5989 Other problems related to housing and economic circumstances: Secondary | ICD-10-CM | POA: Diagnosis not present

## 2022-02-13 DIAGNOSIS — I482 Chronic atrial fibrillation, unspecified: Secondary | ICD-10-CM

## 2022-02-13 DIAGNOSIS — I10 Essential (primary) hypertension: Secondary | ICD-10-CM | POA: Diagnosis not present

## 2022-02-13 DIAGNOSIS — I110 Hypertensive heart disease with heart failure: Secondary | ICD-10-CM | POA: Diagnosis not present

## 2022-02-13 DIAGNOSIS — M109 Gout, unspecified: Secondary | ICD-10-CM | POA: Diagnosis present

## 2022-02-13 DIAGNOSIS — R609 Edema, unspecified: Secondary | ICD-10-CM | POA: Diagnosis not present

## 2022-02-13 DIAGNOSIS — E1140 Type 2 diabetes mellitus with diabetic neuropathy, unspecified: Secondary | ICD-10-CM | POA: Diagnosis not present

## 2022-02-13 DIAGNOSIS — L03115 Cellulitis of right lower limb: Secondary | ICD-10-CM | POA: Diagnosis not present

## 2022-02-13 DIAGNOSIS — I872 Venous insufficiency (chronic) (peripheral): Secondary | ICD-10-CM | POA: Diagnosis not present

## 2022-02-13 DIAGNOSIS — E1165 Type 2 diabetes mellitus with hyperglycemia: Secondary | ICD-10-CM

## 2022-02-13 DIAGNOSIS — J9601 Acute respiratory failure with hypoxia: Secondary | ICD-10-CM

## 2022-02-13 DIAGNOSIS — L97919 Non-pressure chronic ulcer of unspecified part of right lower leg with unspecified severity: Secondary | ICD-10-CM | POA: Diagnosis not present

## 2022-02-13 DIAGNOSIS — E1121 Type 2 diabetes mellitus with diabetic nephropathy: Secondary | ICD-10-CM | POA: Diagnosis present

## 2022-02-13 DIAGNOSIS — Z7901 Long term (current) use of anticoagulants: Secondary | ICD-10-CM

## 2022-02-13 DIAGNOSIS — M1711 Unilateral primary osteoarthritis, right knee: Secondary | ICD-10-CM | POA: Diagnosis present

## 2022-02-13 DIAGNOSIS — D631 Anemia in chronic kidney disease: Secondary | ICD-10-CM | POA: Diagnosis present

## 2022-02-13 DIAGNOSIS — I4510 Unspecified right bundle-branch block: Secondary | ICD-10-CM | POA: Diagnosis not present

## 2022-02-13 DIAGNOSIS — I5033 Acute on chronic diastolic (congestive) heart failure: Secondary | ICD-10-CM | POA: Diagnosis present

## 2022-02-13 DIAGNOSIS — G4733 Obstructive sleep apnea (adult) (pediatric): Secondary | ICD-10-CM | POA: Diagnosis present

## 2022-02-13 DIAGNOSIS — Z741 Need for assistance with personal care: Secondary | ICD-10-CM | POA: Diagnosis not present

## 2022-02-13 DIAGNOSIS — E1142 Type 2 diabetes mellitus with diabetic polyneuropathy: Secondary | ICD-10-CM

## 2022-02-13 DIAGNOSIS — G2581 Restless legs syndrome: Secondary | ICD-10-CM | POA: Diagnosis not present

## 2022-02-13 DIAGNOSIS — E119 Type 2 diabetes mellitus without complications: Secondary | ICD-10-CM

## 2022-02-13 DIAGNOSIS — E871 Hypo-osmolality and hyponatremia: Secondary | ICD-10-CM | POA: Diagnosis not present

## 2022-02-13 DIAGNOSIS — I4891 Unspecified atrial fibrillation: Secondary | ICD-10-CM | POA: Diagnosis not present

## 2022-02-13 DIAGNOSIS — Z888 Allergy status to other drugs, medicaments and biological substances status: Secondary | ICD-10-CM

## 2022-02-13 DIAGNOSIS — L97512 Non-pressure chronic ulcer of other part of right foot with fat layer exposed: Secondary | ICD-10-CM | POA: Diagnosis not present

## 2022-02-13 DIAGNOSIS — Z20822 Contact with and (suspected) exposure to covid-19: Secondary | ICD-10-CM | POA: Diagnosis not present

## 2022-02-13 DIAGNOSIS — N1832 Chronic kidney disease, stage 3b: Secondary | ICD-10-CM | POA: Diagnosis present

## 2022-02-13 DIAGNOSIS — E66813 Obesity, class 3: Secondary | ICD-10-CM

## 2022-02-13 DIAGNOSIS — R2689 Other abnormalities of gait and mobility: Secondary | ICD-10-CM | POA: Diagnosis not present

## 2022-02-13 DIAGNOSIS — Z66 Do not resuscitate: Secondary | ICD-10-CM | POA: Diagnosis present

## 2022-02-13 DIAGNOSIS — R531 Weakness: Secondary | ICD-10-CM | POA: Diagnosis not present

## 2022-02-13 DIAGNOSIS — E876 Hypokalemia: Secondary | ICD-10-CM | POA: Diagnosis not present

## 2022-02-13 DIAGNOSIS — I130 Hypertensive heart and chronic kidney disease with heart failure and stage 1 through stage 4 chronic kidney disease, or unspecified chronic kidney disease: Secondary | ICD-10-CM | POA: Diagnosis not present

## 2022-02-13 DIAGNOSIS — T560X1A Toxic effect of lead and its compounds, accidental (unintentional), initial encounter: Secondary | ICD-10-CM | POA: Diagnosis not present

## 2022-02-13 DIAGNOSIS — I4819 Other persistent atrial fibrillation: Secondary | ICD-10-CM | POA: Diagnosis present

## 2022-02-13 DIAGNOSIS — L8990 Pressure ulcer of unspecified site, unspecified stage: Secondary | ICD-10-CM | POA: Insufficient documentation

## 2022-02-13 DIAGNOSIS — R0603 Acute respiratory distress: Secondary | ICD-10-CM | POA: Diagnosis present

## 2022-02-13 DIAGNOSIS — E11621 Type 2 diabetes mellitus with foot ulcer: Secondary | ICD-10-CM | POA: Diagnosis not present

## 2022-02-13 DIAGNOSIS — Z6841 Body Mass Index (BMI) 40.0 and over, adult: Secondary | ICD-10-CM | POA: Diagnosis not present

## 2022-02-13 DIAGNOSIS — M6281 Muscle weakness (generalized): Secondary | ICD-10-CM | POA: Diagnosis not present

## 2022-02-13 DIAGNOSIS — M25522 Pain in left elbow: Secondary | ICD-10-CM | POA: Diagnosis not present

## 2022-02-13 DIAGNOSIS — N179 Acute kidney failure, unspecified: Secondary | ICD-10-CM | POA: Diagnosis present

## 2022-02-13 DIAGNOSIS — I890 Lymphedema, not elsewhere classified: Secondary | ICD-10-CM | POA: Diagnosis not present

## 2022-02-13 DIAGNOSIS — J811 Chronic pulmonary edema: Secondary | ICD-10-CM | POA: Diagnosis not present

## 2022-02-13 DIAGNOSIS — I517 Cardiomegaly: Secondary | ICD-10-CM | POA: Diagnosis not present

## 2022-02-13 DIAGNOSIS — L97929 Non-pressure chronic ulcer of unspecified part of left lower leg with unspecified severity: Secondary | ICD-10-CM | POA: Diagnosis present

## 2022-02-13 DIAGNOSIS — Z833 Family history of diabetes mellitus: Secondary | ICD-10-CM

## 2022-02-13 DIAGNOSIS — Z7984 Long term (current) use of oral hypoglycemic drugs: Secondary | ICD-10-CM

## 2022-02-13 DIAGNOSIS — Z7401 Bed confinement status: Secondary | ICD-10-CM | POA: Diagnosis not present

## 2022-02-13 DIAGNOSIS — M2550 Pain in unspecified joint: Secondary | ICD-10-CM | POA: Diagnosis not present

## 2022-02-13 LAB — BASIC METABOLIC PANEL
Anion gap: 8 (ref 5–15)
BUN: 29 mg/dL — ABNORMAL HIGH (ref 8–23)
CO2: 25 mmol/L (ref 22–32)
Calcium: 8.6 mg/dL — ABNORMAL LOW (ref 8.9–10.3)
Chloride: 105 mmol/L (ref 98–111)
Creatinine, Ser: 1.8 mg/dL — ABNORMAL HIGH (ref 0.61–1.24)
GFR, Estimated: 40 mL/min — ABNORMAL LOW (ref >60)
Glucose, Bld: 124 mg/dL — ABNORMAL HIGH (ref 70–99)
Potassium: 4.7 mmol/L (ref 3.5–5.1)
Sodium: 138 mmol/L (ref 135–145)

## 2022-02-13 LAB — CBC WITH DIFFERENTIAL/PLATELET
Abs Immature Granulocytes: 0.02 10*3/uL (ref 0.00–0.07)
Basophils Absolute: 0.1 10*3/uL (ref 0.0–0.1)
Basophils Relative: 1 %
Eosinophils Absolute: 0.4 10*3/uL (ref 0.0–0.5)
Eosinophils Relative: 5 %
HCT: 32.6 % — ABNORMAL LOW (ref 39.0–52.0)
Hemoglobin: 10 g/dL — ABNORMAL LOW (ref 13.0–17.0)
Immature Granulocytes: 0 %
Lymphocytes Relative: 11 %
Lymphs Abs: 0.8 10*3/uL (ref 0.7–4.0)
MCH: 23 pg — ABNORMAL LOW (ref 26.0–34.0)
MCHC: 30.7 g/dL (ref 30.0–36.0)
MCV: 74.9 fL — ABNORMAL LOW (ref 80.0–100.0)
Monocytes Absolute: 0.7 10*3/uL (ref 0.1–1.0)
Monocytes Relative: 11 %
Neutro Abs: 4.9 10*3/uL (ref 1.7–7.7)
Neutrophils Relative %: 72 %
Platelets: 235 10*3/uL (ref 150–400)
RBC: 4.35 MIL/uL (ref 4.22–5.81)
RDW: 18.8 % — ABNORMAL HIGH (ref 11.5–15.5)
WBC: 6.8 10*3/uL (ref 4.0–10.5)
nRBC: 0 % (ref 0.0–0.2)

## 2022-02-13 LAB — RESP PANEL BY RT-PCR (FLU A&B, COVID) ARPGX2
Influenza A by PCR: NEGATIVE
Influenza B by PCR: NEGATIVE
SARS Coronavirus 2 by RT PCR: NEGATIVE

## 2022-02-13 LAB — GLUCOSE, CAPILLARY: Glucose-Capillary: 174 mg/dL — ABNORMAL HIGH (ref 70–99)

## 2022-02-13 LAB — TROPONIN I (HIGH SENSITIVITY)
Troponin I (High Sensitivity): 9 ng/L (ref <18)
Troponin I (High Sensitivity): 9 ng/L (ref <18)

## 2022-02-13 LAB — BRAIN NATRIURETIC PEPTIDE: B Natriuretic Peptide: 293.8 pg/mL — ABNORMAL HIGH (ref 0.0–100.0)

## 2022-02-13 MED ORDER — HYDROCODONE-ACETAMINOPHEN 5-325 MG PO TABS
1.0000 | ORAL_TABLET | Freq: Every day | ORAL | Status: DC
  Administered 2022-02-13 – 2022-02-16 (×4): 1 via ORAL
  Filled 2022-02-13 (×5): qty 1

## 2022-02-13 MED ORDER — INSULIN ASPART 100 UNIT/ML IJ SOLN
0.0000 [IU] | Freq: Three times a day (TID) | INTRAMUSCULAR | Status: DC
  Administered 2022-02-14 – 2022-02-16 (×4): 2 [IU] via SUBCUTANEOUS
  Administered 2022-02-16: 3 [IU] via SUBCUTANEOUS
  Administered 2022-02-17 – 2022-02-19 (×7): 2 [IU] via SUBCUTANEOUS
  Administered 2022-02-19: 3 [IU] via SUBCUTANEOUS
  Administered 2022-02-20 (×2): 2 [IU] via SUBCUTANEOUS
  Administered 2022-02-20 – 2022-02-21 (×2): 3 [IU] via SUBCUTANEOUS
  Administered 2022-02-21: 2 [IU] via SUBCUTANEOUS
  Administered 2022-02-21 – 2022-02-22 (×3): 3 [IU] via SUBCUTANEOUS
  Administered 2022-02-22: 2 [IU] via SUBCUTANEOUS
  Administered 2022-02-23: 3 [IU] via SUBCUTANEOUS
  Administered 2022-02-23: 5 [IU] via SUBCUTANEOUS
  Administered 2022-02-24 (×2): 3 [IU] via SUBCUTANEOUS
  Administered 2022-02-24 – 2022-02-25 (×3): 5 [IU] via SUBCUTANEOUS
  Administered 2022-02-25: 3 [IU] via SUBCUTANEOUS
  Administered 2022-02-26: 5 [IU] via SUBCUTANEOUS
  Administered 2022-02-26: 3 [IU] via SUBCUTANEOUS
  Administered 2022-02-26 – 2022-02-27 (×3): 5 [IU] via SUBCUTANEOUS
  Administered 2022-02-27 – 2022-02-28 (×2): 3 [IU] via SUBCUTANEOUS
  Administered 2022-02-28 (×2): 5 [IU] via SUBCUTANEOUS
  Administered 2022-03-01 (×2): 3 [IU] via SUBCUTANEOUS
  Administered 2022-03-02: 2 [IU] via SUBCUTANEOUS
  Administered 2022-03-02 – 2022-03-03 (×3): 3 [IU] via SUBCUTANEOUS
  Administered 2022-03-03: 2 [IU] via SUBCUTANEOUS
  Administered 2022-03-03 – 2022-03-05 (×5): 3 [IU] via SUBCUTANEOUS
  Administered 2022-03-05 (×2): 2 [IU] via SUBCUTANEOUS
  Administered 2022-03-06 (×2): 3 [IU] via SUBCUTANEOUS
  Administered 2022-03-06: 2 [IU] via SUBCUTANEOUS
  Administered 2022-03-07 (×2): 5 [IU] via SUBCUTANEOUS

## 2022-02-13 MED ORDER — DILTIAZEM HCL ER COATED BEADS 120 MG PO CP24
120.0000 mg | ORAL_CAPSULE | Freq: Every day | ORAL | Status: DC
  Administered 2022-02-14 – 2022-03-07 (×22): 120 mg via ORAL
  Filled 2022-02-13 (×23): qty 1

## 2022-02-13 MED ORDER — ALBUTEROL SULFATE (2.5 MG/3ML) 0.083% IN NEBU: 2.5000 mg | INHALATION_SOLUTION | Freq: Four times a day (QID) | RESPIRATORY_TRACT | Status: DC | PRN

## 2022-02-13 MED ORDER — ACETAMINOPHEN 650 MG RE SUPP: 650.0000 mg | Freq: Four times a day (QID) | RECTAL | Status: DC | PRN

## 2022-02-13 MED ORDER — INSULIN ASPART PROT & ASPART (70-30 MIX) 100 UNIT/ML ~~LOC~~ SUSP
20.0000 [IU] | Freq: Two times a day (BID) | SUBCUTANEOUS | Status: DC
  Administered 2022-02-14 – 2022-02-26 (×23): 20 [IU] via SUBCUTANEOUS
  Filled 2022-02-13 (×2): qty 10

## 2022-02-13 MED ORDER — ISOSORBIDE MONONITRATE ER 30 MG PO TB24
30.0000 mg | ORAL_TABLET | Freq: Every day | ORAL | Status: DC
  Administered 2022-02-14 – 2022-03-01 (×16): 30 mg via ORAL
  Filled 2022-02-13 (×16): qty 1

## 2022-02-13 MED ORDER — ONDANSETRON HCL 4 MG PO TABS: 4.0000 mg | ORAL_TABLET | Freq: Four times a day (QID) | ORAL | Status: DC | PRN

## 2022-02-13 MED ORDER — ADULT MULTIVITAMIN W/MINERALS CH
1.0000 | ORAL_TABLET | Freq: Every day | ORAL | Status: DC
  Administered 2022-02-14 – 2022-03-07 (×22): 1 via ORAL
  Filled 2022-02-13 (×22): qty 1

## 2022-02-13 MED ORDER — RIVAROXABAN 20 MG PO TABS
20.0000 mg | ORAL_TABLET | Freq: Every day | ORAL | Status: DC
  Administered 2022-02-13 – 2022-02-17 (×5): 20 mg via ORAL
  Filled 2022-02-13 (×5): qty 1

## 2022-02-13 MED ORDER — INSULIN LISPRO PROT & LISPRO (75-25 MIX) 100 UNIT/ML KWIKPEN: 20.0000 [IU] | PEN_INJECTOR | Freq: Two times a day (BID) | SUBCUTANEOUS | Status: DC

## 2022-02-13 MED ORDER — ONDANSETRON HCL 4 MG/2ML IJ SOLN: 4.0000 mg | Freq: Four times a day (QID) | INTRAMUSCULAR | Status: DC | PRN

## 2022-02-13 MED ORDER — FUROSEMIDE 10 MG/ML IJ SOLN
40.0000 mg | Freq: Once | INTRAMUSCULAR | Status: AC
  Administered 2022-02-13: 40 mg via INTRAVENOUS
  Filled 2022-02-13: qty 4

## 2022-02-13 MED ORDER — LORATADINE 10 MG PO TABS
10.0000 mg | ORAL_TABLET | Freq: Every day | ORAL | Status: DC
Start: 2022-02-13 — End: 2022-03-07
  Administered 2022-02-13 – 2022-03-06 (×22): 10 mg via ORAL
  Filled 2022-02-13 (×22): qty 1

## 2022-02-13 MED ORDER — TERAZOSIN HCL 5 MG PO CAPS
10.0000 mg | ORAL_CAPSULE | Freq: Every day | ORAL | Status: DC
  Administered 2022-02-13 – 2022-03-06 (×22): 10 mg via ORAL
  Filled 2022-02-13 (×25): qty 2

## 2022-02-13 MED ORDER — INSULIN ASPART 100 UNIT/ML IJ SOLN
0.0000 [IU] | Freq: Every day | INTRAMUSCULAR | Status: DC
  Administered 2022-02-23: 2 [IU] via SUBCUTANEOUS
  Administered 2022-02-25: 3 [IU] via SUBCUTANEOUS
  Administered 2022-02-26 – 2022-03-06 (×4): 2 [IU] via SUBCUTANEOUS

## 2022-02-13 MED ORDER — ACETAMINOPHEN 325 MG PO TABS
650.0000 mg | ORAL_TABLET | Freq: Four times a day (QID) | ORAL | Status: DC | PRN
  Administered 2022-02-16 – 2022-03-05 (×8): 650 mg via ORAL
  Filled 2022-02-13 (×8): qty 2

## 2022-02-13 MED ORDER — ONE-DAILY MULTI VITAMINS PO TABS: 1.0000 | ORAL_TABLET | Freq: Every day | ORAL | Status: DC

## 2022-02-13 MED ORDER — FUROSEMIDE 10 MG/ML IJ SOLN
80.0000 mg | Freq: Two times a day (BID) | INTRAMUSCULAR | Status: DC
  Administered 2022-02-14 – 2022-02-15 (×3): 80 mg via INTRAVENOUS
  Filled 2022-02-13 (×3): qty 8

## 2022-02-13 NOTE — Patient Instructions (Signed)
[name] , ?Thank you for taking time to come for your Medicare Wellness Visit. I appreciate your ongoing commitment to your health goals. Please review the following plan we discussed and let me know if I can assist you in the future.  ? ?Screening recommendations/referrals: ?Colonoscopy: not at this time  ?Recommended yearly ophthalmology/optometry visit for glaucoma screening and checkup ?Recommended yearly dental visit for hygiene and checkup ? ?Vaccinations: ?Influenza vaccine: Done 07/11/21 repeat every year  ?Pneumococcal vaccine: Up to date ?Tdap vaccine: Done 04/10/09 repeat every 10 years  ?Shingles vaccine: Shingrix discussed. Please contact your pharmacy for coverage information.    ?Covid-19: Completed 12/14/19, 01/10/20, 09/15/20 ? ?Advanced directives: Advance directive discussed with you today. Even though you declined this today please call our office should you change your mind and we can give you the proper paperwork for you to fill out. ? ?Conditions/risks identified: Get fluid off and join the gym ? ?Next appointment: Follow up in one year for your annual wellness visit.  ? ?Preventive Care 3365 Years and Older, Male ?Preventive care refers to lifestyle choices and visits with your health care provider that can promote health and wellness. ?What does preventive care include? ?A yearly physical exam. This is also called an annual well check. ?Dental exams once or twice a year. ?Routine eye exams. Ask your health care provider how often you should have your eyes checked. ?Personal lifestyle choices, including: ?Daily care of your teeth and gums. ?Regular physical activity. ?Eating a healthy diet. ?Avoiding tobacco and drug use. ?Limiting alcohol use. ?Practicing safe sex. ?Taking low doses of aspirin every day. ?Taking vitamin and mineral supplements as recommended by your health care provider. ?What happens during an annual well check? ?The services and screenings done by your health care provider during  your annual well check will depend on your age, overall health, lifestyle risk factors, and family history of disease. ?Counseling  ?Your health care provider may ask you questions about your: ?Alcohol use. ?Tobacco use. ?Drug use. ?Emotional well-being. ?Home and relationship well-being. ?Sexual activity. ?Eating habits. ?History of falls. ?Memory and ability to understand (cognition). ?Work and work Astronomerenvironment. ?Screening  ?You may have the following tests or measurements: ?Height, weight, and BMI. ?Blood pressure. ?Lipid and cholesterol levels. These may be checked every 5 years, or more frequently if you are over 71 years old. ?Skin check. ?Lung cancer screening. You may have this screening every year starting at age 71 if you have a 30-pack-year history of smoking and currently smoke or have quit within the past 15 years. ?Fecal occult blood test (FOBT) of the stool. You may have this test every year starting at age 150. ?Flexible sigmoidoscopy or colonoscopy. You may have a sigmoidoscopy every 5 years or a colonoscopy every 10 years starting at age 71. ?Prostate cancer screening. Recommendations will vary depending on your family history and other risks. ?Hepatitis C blood test. ?Hepatitis B blood test. ?Sexually transmitted disease (STD) testing. ?Diabetes screening. This is done by checking your blood sugar (glucose) after you have not eaten for a while (fasting). You may have this done every 1-3 years. ?Abdominal aortic aneurysm (AAA) screening. You may need this if you are a current or former smoker. ?Osteoporosis. You may be screened starting at age 71 if you are at high risk. ?Talk with your health care provider about your test results, treatment options, and if necessary, the need for more tests. ?Vaccines  ?Your health care provider may recommend certain vaccines, such as: ?  Influenza vaccine. This is recommended every year. ?Tetanus, diphtheria, and acellular pertussis (Tdap, Td) vaccine. You may need  a Td booster every 10 years. ?Zoster vaccine. You may need this after age 44. ?Pneumococcal 13-valent conjugate (PCV13) vaccine. One dose is recommended after age 4. ?Pneumococcal polysaccharide (PPSV23) vaccine. One dose is recommended after age 64. ?Talk to your health care provider about which screenings and vaccines you need and how often you need them. ?This information is not intended to replace advice given to you by your health care provider. Make sure you discuss any questions you have with your health care provider. ?Document Released: 11/17/2015 Document Revised: 07/10/2016 Document Reviewed: 08/22/2015 ?Elsevier Interactive Patient Education ? 2017 [employer] ? ?Fall Prevention in the Home ?Falls can cause injuries. They can happen to people of all ages. There are many things you can do to make your home safe and to help prevent falls. ?What can I do on the outside of my home? ?Regularly fix the edges of walkways and driveways and fix any cracks. ?Remove anything that might make you trip as you walk through a door, such as a raised step or threshold. ?Trim any bushes or trees on the path to your home. ?Use bright outdoor lighting. ?Clear any walking paths of anything that might make someone trip, such as rocks or tools. ?Regularly check to see if handrails are loose or broken. Make sure that both sides of any steps have handrails. ?Any raised decks and porches should have guardrails on the edges. ?Have any leaves, snow, or ice cleared regularly. ?Use sand or salt on walking paths during winter. ?Clean up any spills in your garage right away. This includes oil or grease spills. ?What can I do in the bathroom? ?Use night lights. ?Install grab bars by the toilet and in the tub and shower. Do not use towel bars as grab bars. ?Use non-skid mats or decals in the tub or shower. ?If you need to sit down in the shower, use a plastic, non-slip stool. ?Keep the floor dry. Clean up any water that spills on the  floor as soon as it happens. ?Remove soap buildup in the tub or shower regularly. ?Attach bath mats securely with double-sided non-slip rug tape. ?Do not have throw rugs and other things on the floor that can make you trip. ?What can I do in the bedroom? ?Use night lights. ?Make sure that you have a light by your bed that is easy to reach. ?Do not use any sheets or blankets that are too big for your bed. They should not hang down onto the floor. ?Have a firm chair that has side arms. You can use this for support while you get dressed. ?Do not have throw rugs and other things on the floor that can make you trip. ?What can I do in the kitchen? ?Clean up any spills right away. ?Avoid walking on wet floors. ?Keep items that you use a lot in easy-to-reach places. ?If you need to reach something above you, use a strong step stool that has a grab bar. ?Keep electrical cords out of the way. ?Do not use floor polish or wax that makes floors slippery. If you must use wax, use non-skid floor wax. ?Do not have throw rugs and other things on the floor that can make you trip. ?What can I do with my stairs? ?Do not leave any items on the stairs. ?Make sure that there are handrails on both sides of the stairs and use them.  Fix handrails that are broken or loose. Make sure that handrails are as long as the stairways. ?Check any carpeting to make sure that it is firmly attached to the stairs. Fix any carpet that is loose or worn. ?Avoid having throw rugs at the top or bottom of the stairs. If you do have throw rugs, attach them to the floor with carpet tape. ?Make sure that you have a light switch at the top of the stairs and the bottom of the stairs. If you do not have them, ask someone to add them for you. ?What else can I do to help prevent falls? ?Wear shoes that: ?Do not have high heels. ?Have rubber bottoms. ?Are comfortable and fit you well. ?Are closed at the toe. Do not wear sandals. ?If you use a stepladder: ?Make sure that  it is fully opened. Do not climb a closed stepladder. ?Make sure that both sides of the stepladder are locked into place. ?Ask someone to hold it for you, if possible. ?Clearly mark and make sure tha

## 2022-02-13 NOTE — Progress Notes (Addendum)
Virtual Visit via Telephone Note ? ?I connected with  [name] on 02/13/22 at  1:30 PM EDT by telephone and verified that I am speaking with the correct person using two identifiers. ? ?Medicare Annual Wellness visit completed telephonically due to Covid-19 pandemic.  ? ?Persons participating in this call: This Health Coach and this patient.  ? ?Location: ?Patient: Home ?Provider: Office  ?  ?I discussed the limitations, risks, security and privacy concerns of performing an evaluation and management service by telephone and the availability of in person appointments. The patient expressed understanding and agreed to proceed. ? ?Unable to perform video visit due to video visit attempted and failed and/or patient does not have video capability.  ? ?Some vital signs may be absent or patient reported.  ? ?Willette Brace, LPN ? ? ?Subjective:  ? [name] is a 71 y.o. male who presents for Medicare Annual/Subsequent preventive examination. ? ?Review of Systems    ? ?Cardiac Risk Factors include: advanced age (>60men, >62 women);diabetes mellitus;hypertension;dyslipidemia;male gender;sedentary lifestyle;obesity (BMI >30kg/m2) ? ?   ?Objective:  ?  ?There were no vitals filed for this visit. ?There is no height or weight on file to calculate BMI. ? ? ?  02/13/2022  ?  1:58 PM 02/07/2021  ?  9:04 AM 01/13/2018  ?  2:06 PM 02/12/2016  ? 10:46 AM  ?Advanced Directives  ?Does Patient Have a Medical Advance Directive? No No No No  ?Would patient like information on creating a medical advance directive? No - Patient declined Yes (MAU/Ambulatory/Procedural Areas - Information given) Yes (MAU/Ambulatory/Procedural Areas - Information given) Yes - Educational materials given  ? ? ?Current Medications (verified) ?Outpatient Encounter Medications as of 02/13/2022  ?Medication Sig  ? amLODipine (NORVASC) 10 MG tablet Take 1 tablet (10 mg total) by mouth daily.  ? arginine 500 MG tablet Take by mouth.  ? b complex  vitamins capsule Take 1 capsule by mouth every morning.  ? butalbital-acetaminophen-caffeine (FIORICET WITH CODEINE) 50-325-40-30 MG capsule TAKE 1 TO 2 CAPSULES BY MOUTH EVERY 6 HOURS AS NEEDED FOR HEADACHES. NOT TO EXCEED 6 CAPSULES PER DAY  ? cetirizine (ZYRTEC) 10 MG tablet Take 10 mg by mouth daily.  ? CHROMIUM GTF PO Take by mouth daily.  ? Coenzyme Q10 (CO Q 10) 10 MG CAPS Take by mouth. Reported on 02/19/2016  ? diltiazem (TIAZAC) 120 MG 24 hr capsule Take 1 capsule (120 mg total) by mouth daily.  ? furosemide (LASIX) 40 MG tablet TAKE 2 TABLETS BY MOUTH  TWICE DAILY  ? glucose blood (ONETOUCH ULTRA) test strip CHECK BLOOD SUGAR TWICE  DAILY  ? Glutamine 500 MG CAPS Take by mouth.  ? HYDROcodone-acetaminophen (NORCO/VICODIN) 5-325 MG tablet 1-2 tabs po bid prn pain (Patient taking differently: 1-2 tabs po bid prn pain at night)  ? Insulin Lispro Prot & Lispro (HUMALOG MIX 75/25 KWIKPEN) (75-25) 100 UNIT/ML Kwikpen INJECT SUBCUTANEOUSLY 25  UNITS EVERY MORNING AND 20  UNITS EVERY EVENING AT  SUPPER  ? Insulin Pen Needle (B-D ULTRAFINE III SHORT PEN) 31G X 8 MM MISC USE 1 PENNEEDLE TWICE DAILY  ? isosorbide mononitrate (IMDUR) 30 MG 24 hr tablet Take 1 tablet (30 mg total) by mouth daily.  ? metFORMIN (GLUCOPHAGE) 1000 MG tablet TAKE 1 TABLET BY MOUTH TWICE  DAILY WITH MEALS  ? Multiple Vitamin (MULTIVITAMIN) tablet Take 1 tablet by mouth daily.  ? rivaroxaban (XARELTO) 20 MG TABS tablet TAKE 1 TABLET BY MOUTH ONCE DAILY WITH SUPPER  ?  terazosin (HYTRIN) 10 MG capsule TAKE 1 CAPSULE BY MOUTH  ONCE DAILY AT BEDTIME  ? valsartan (DIOVAN) 320 MG tablet Take 1 tablet (320 mg total) by mouth daily.  ? vitamin E 400 UNIT capsule Take 400 Units by mouth every morning. Selenium 50mg   ? ?No facility-administered encounter medications on file as of 02/13/2022.  ? ? ?Allergies (verified) ?Other and Hydralazine  ? ?History: ?Past Medical History:  ?Diagnosis Date  ? ALLERGIC RHINITIS 08/11/2006  ? ASTHMA 08/11/2006  ?  Bradycardia   ? Beta blocker d/c'd 2022  ? Chronic atrial fibrillation ([place]) 08/2014  ? Chronic combined systolic and diastolic CHF (congestive heart failure) ([place])   ? Cardiology f/u 12/2017: pt volume overloaded (R heart dysf suspected), BNP very high, lasix increased.  Repeat echo 12/2017: normal LV EF, mild DD, +RV syst dysfxn, mod pulm HTN, biatrial enlgmt.  ? Chronic constipation   ? Chronic renal insufficiency, stage 3 (moderate) ([facility])   ? Colon cancer screening 02/2021  ? 02/2021 Cologuard POS->GI ref  ? DIABETES MELLITUS, TYPE II 08/11/2006  ? HYPERTENSION 08/11/2006  ? Impaired mobility and endurance   ? MRSA infection 06/2018  ? LL venous stasis ulcer infected  ? Normocytic anemia 2016-2019  ? 03/2018 B12 normal, iron ok (ferritin borderline low).  ? OBESITY, MORBID 12/14/2007  ? saxenda started 05/2020 by [facility] wt mgmt center  ? OSA (obstructive sleep apnea)   ? to get sleep study [facility] as of 12/03/2018 consult.  ? 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 06/2017 Clindamycin suppression (hx of MRSA) caused diarrhea.  [id number] ID started him on amoxil prophylaxis---ineffective.  End 2018/Jan 2019 penicillin G injections prophyl helpful but pt declined to continue this as of 01/2018 ID f/u.  ID talked him into resuming monthly penicillin G as of 02/2018 f/u.    ? Restless leg syndrome   ? Rx'd clonazepam 09/2017 and pt refused to take it after reading the medication's potential side effects.  ? Venous stasis ulcers of both lower extremities ([place])   ? Severe lymphedema.  wound clinic care ongoing as of 01/2018  ? ?Past Surgical History:  ?Procedure Laterality Date  ? Carotid dopplers  07/23/2018  ? Left NORMAL.  Right 1-39% ICA stenosis, with <50% distal CCA stenosis (not hemodynamically significant)  ? TRANSTHORACIC ECHOCARDIOGRAM  11/2007; 09/2014; 11/2015;12/2017  ? LV fxn normal, EF normal, mild dilation of left atrium.  2015 grade II diast dysfxn.  2017 EF 55-60%. 2019: LVEF 60-65%,  mild RV syst dysf,biatrial enlgmt, mod pulm htn.  ? URETERAL STENT PLACEMENT    ? virtual colonoscopy  01/2011  ? Normal  ? ?Family History  ?Problem Relation Age of Onset  ? Diabetes Mother   ? Diabetes Sister   ? Hydrocephalus Sister   ?     NPH  ? ?Social History  ? ?Socioeconomic History  ? Marital status: Single  ?  Spouse name: Not on file  ? Number of children: 0  ? Years of education: 74  ? Highest education level: Associate degree: occupational, Hotel manager, or vocational program  ?Occupational History  ? Occupation: retired  ?Tobacco Use  ? Smoking status: Never  ?  Passive exposure: Never  ? Smokeless tobacco: Never  ?Vaping Use  ? Vaping Use: Never used  ?Substance and Sexual Activity  ? Alcohol use: No  ? Drug use: No  ? Sexual activity: Not Currently  ?Other Topics Concern  ? Not on file  ?Social History  Narrative  ? Single, no children.  ? Lives in [place] since 1985 [place]  ? Occupation: Counsellor for [employer].  ? No Tobacco, no alc, drugs.  ? Exercise: no   ? Diet: no  ?   ? ?Social Determinants of Health  ? ?Financial Resource Strain: Medium Risk  ? Difficulty of Paying Living Expenses: Somewhat hard  ?Food Insecurity: Food Insecurity Present  ? Worried About Charity fundraiser in the Last Year: Sometimes true  ? Ran Out of Food in the Last Year: Sometimes true  ?Transportation Needs: No Transportation Needs  ? Lack of Transportation (Medical): No  ? Lack of Transportation (Non-Medical): No  ?Physical Activity: Inactive  ? Days of Exercise per Week: 0 days  ? Minutes of Exercise per Session: 0 min  ?Stress: Stress Concern Present  ? Feeling of Stress : Very much  ?Social Connections: Moderately Integrated  ? Frequency of Communication with Friends and Family: Three times a week  ? Frequency of Social Gatherings with Friends and Family: Never  ? Attends Religious Services: 1 to 4 times per year  ? Active Member of Clubs or Organizations: Yes  ? Attends Theatre manager Meetings: 1 to 4 times per year  ? Marital Status: Never married  ? ? ?Tobacco Counseling ?Counseling given: Not Answered ? ? ?Clinical Intake: ? ?Pre-visit preparation completed: Yes ? ?Pain : No/denies pain

## 2022-02-13 NOTE — ED Provider Notes (Signed)
?[facility] DEPT ?Provider Note ? ? ?CSN: [id number] ?Arrival date & time: 02/13/22  1429 ? ?  ? ?History ? ?Chief Complaint  ?Patient presents with  ? Shortness of Breath  ? ? ?[name] is a 71 y.o. male. ? ? Patient as above with significant medical history as below, including A-fib, DOAC use, combined heart failure, who presents to the ED with complaint of dyspnea, weight gain.  Patient is on Lasix at home with good compliance.  40 mg p.o. daily.  Reports over the past month he has gained approximate 25 pounds, increasing swelling to his thighs and abdomen.  Exertional dyspnea.  Patient was sent by PCP for evaluation of fluid overload, worsening respiratory status. ? ?No home oxygen use, in triage patient was 84% on room air ? ?Dr Debara Pickett cardiology ? ?Past Medical History: ?08/11/2006: ALLERGIC RHINITIS ?08/11/2006: ASTHMA ?No date: Bradycardia ?    Comment:  Beta blocker d/c'd 2022 ?08/2014: Chronic atrial fibrillation ([place]) ?No date: Chronic combined systolic and diastolic CHF (congestive  ?heart failure) ([place]) ?    Comment:  Cardiology f/u 12/2017: pt volume overloaded (R heart  ?             dysf suspected), BNP very high, lasix increased.  Repeat  ?             echo 12/2017: normal LV EF, mild DD, +RV syst dysfxn, mod  ?             pulm HTN, biatrial enlgmt. ?No date: Chronic constipation ?No date: Chronic renal insufficiency, stage 3 (moderate) ([place]) ?02/2021: Colon cancer screening ?    Comment:  02/2021 Cologuard POS->GI ref ?08/11/2006: DIABETES MELLITUS, TYPE II ?08/11/2006: HYPERTENSION ?No date: Impaired mobility and endurance ?06/2018: MRSA infection ?    Comment:  LL venous stasis ulcer infected ?2016-2019: Normocytic anemia ?    Comment:  03/2018 B12 normal, iron ok (ferritin borderline low). ?12/14/2007: OBESITY, MORBID ?    Comment:  saxenda started 05/2020 by [facility] wt mgmt center ?No date: OSA (obstructive sleep apnea) ?    Comment:  to get sleep study with  [facility] ?             as of 12/03/2018 consult. ?2017-18: Recurrent cellulitis of lower leg ?    Comment:  06/2017 Clindamycin suppression (hx of MRSA) caused  ?             diarrhea.  [id number] ID started him on amoxil  ?             prophylaxis---ineffective.  End 2018/Jan 2019 penicillin  ?             G injections prophyl helpful but pt declined to continue  ?             this as of 01/2018 ID f/u.  ID talked him into resuming  ?             monthly penicillin G as of 02/2018 f/u.   ?No date: Restless leg syndrome ?    Comment:  Rx'd clonazepam 09/2017 and pt refused to take it after  ?             reading the medication's potential side effects. ?No date: Venous stasis ulcers of both lower extremities ([place]) ?    Comment:  Severe lymphedema.  wound clinic care ongoing as of  ?  01/2018 ? ?Past Surgical History: ?07/23/2018: Carotid dopplers ?    Comment:  Left NORMAL.  Right 1-39% ICA stenosis, with <50% distal ?             CCA stenosis (not hemodynamically significant) ?11/2007; 09/2014; 11/2015;12/2017: TRANSTHORACIC ECHOCARDIOGRAM ?    Comment:  LV fxn normal, EF normal, mild dilation of left atrium.  ?             2015 grade II diast dysfxn.  2017 EF 55-60%. 2019: LVEF  ?             60-65%, mild RV syst dysf,biatrial enlgmt, mod pulm htn. ?No date: URETERAL STENT PLACEMENT ?01/2011: virtual colonoscopy ?    Comment:  Normal  ? ? ?The history is provided by the patient. No language interpreter was used.  ?Shortness of Breath ?Associated symptoms: no abdominal pain, no chest pain, no cough, no fever, no headaches, no rash and no vomiting   ? ?  ? ?Home Medications ?Prior to Admission medications   ?Medication Sig Start Date End Date Taking? Authorizing Provider  ?amLODipine (NORVASC) 10 MG tablet Take 1 tablet (10 mg total) by mouth daily. 08/03/21   McGowen, Adrian Blackwater, MD  ?arginine 500 MG tablet Take by mouth.    [provider]  ?b complex vitamins capsule Take 1 capsule by  mouth every morning.    [provider]  ?butalbital-acetaminophen-caffeine (FIORICET WITH CODEINE) 50-325-40-30 MG capsule TAKE 1 TO 2 CAPSULES BY MOUTH EVERY 6 HOURS AS NEEDED FOR HEADACHES. NOT TO EXCEED 6 CAPSULES PER DAY 10/03/21   McGowen, Adrian Blackwater, MD  ?cetirizine (ZYRTEC) 10 MG tablet Take 10 mg by mouth daily.    [provider]  ?CHROMIUM GTF PO Take by mouth daily.    [provider]  ?Coenzyme Q10 (CO Q 10) 10 MG CAPS Take by mouth. Reported on 02/19/2016    [provider]  ?diltiazem (TIAZAC) 120 MG 24 hr capsule Take 1 capsule (120 mg total) by mouth daily. 09/14/21   McGowen, Adrian Blackwater, MD  ?furosemide (LASIX) 40 MG tablet TAKE 2 TABLETS BY MOUTH  TWICE DAILY 01/21/22   McGowen, Adrian Blackwater, MD  ?glucose blood (ONETOUCH ULTRA) test strip CHECK BLOOD SUGAR TWICE  DAILY 11/16/21   McGowen, Adrian Blackwater, MD  ?Glutamine 500 MG CAPS Take by mouth.    [provider]  ?HYDROcodone-acetaminophen (NORCO/VICODIN) 5-325 MG tablet 1-2 tabs po bid prn pain ?Patient taking differently: 1-2 tabs po bid prn pain at night 12/11/21   McGowen, Adrian Blackwater, MD  ?Insulin Lispro Prot & Lispro (HUMALOG MIX 75/25 KWIKPEN) (75-25) 100 UNIT/ML Kwikpen INJECT SUBCUTANEOUSLY 25  UNITS EVERY MORNING AND 20  UNITS EVERY EVENING AT  SUPPER 09/21/21   McGowen, Adrian Blackwater, MD  ?Insulin Pen Needle (B-D ULTRAFINE III SHORT PEN) 31G X 8 MM MISC USE 1 PENNEEDLE TWICE DAILY 07/18/21   McGowen, Adrian Blackwater, MD  ?isosorbide mononitrate (IMDUR) 30 MG 24 hr tablet Take 1 tablet (30 mg total) by mouth daily. 10/10/21   McGowen, Adrian Blackwater, MD  ?metFORMIN (GLUCOPHAGE) 1000 MG tablet TAKE 1 TABLET BY MOUTH TWICE  DAILY WITH MEALS 01/22/22   McGowen, Adrian Blackwater, MD  ?Multiple Vitamin (MULTIVITAMIN) tablet Take 1 tablet by mouth daily.    [provider]  ?rivaroxaban (XARELTO) 20 MG TABS tablet TAKE 1 TABLET BY MOUTH ONCE DAILY WITH SUPPER 05/21/21   McGowen, Adrian Blackwater, MD  ?terazosin (HYTRIN) 10 MG capsule TAKE 1  CAPSULE BY MOUTH  ONCE DAILY AT BEDTIME 05/02/21   McGowen, Adrian Blackwater, MD  ?valsartan (DIOVAN) 320 MG tablet Take 1 tablet (320 mg total) by mouth daily. 09/19/21   McGowen, Adrian Blackwater, MD  ?vitamin E 400 UNIT capsule Take 400 Units by mouth every morning. Selenium 50mg     [provider]  ?   ? ?Allergies    ?Other and Hydralazine   ? ?Review of Systems   ?Review of Systems  ?Constitutional:  Positive for fatigue. Negative for chills and fever.  ?HENT:  Negative for facial swelling and trouble swallowing.   ?Eyes:  Negative for photophobia and visual disturbance.  ?Respiratory:  Positive for shortness of breath. Negative for cough.   ?Cardiovascular:  Positive for leg swelling. Negative for chest pain and palpitations.  ?Gastrointestinal:  Negative for abdominal pain, nausea and vomiting.  ?Endocrine: Negative for polydipsia and polyuria.  ?Genitourinary:  Negative for difficulty urinating and hematuria.  ?Musculoskeletal:  Negative for gait problem and joint swelling.  ?Skin:  Negative for pallor and rash.  ?Neurological:  Negative for syncope and headaches.  ?Psychiatric/Behavioral:  Negative for agitation and confusion.   ? ?Physical Exam ?Updated Vital Signs ?BP (!) 154/87   Pulse 65   Temp 98.4 ?F (36.9 ?C) (Oral)   Resp 16   Ht 5\' 10"  (1.778 m)   Wt (!) 181.4 kg   SpO2 99%   BMI 57.39 kg/m?  ?Physical Exam ?Vitals and nursing note reviewed.  ?Constitutional:   ?   General: He is not in acute distress. ?   Appearance: He is well-developed. He is ill-appearing.  ?HENT:  ?   Head: Normocephalic and atraumatic.  ?   Right Ear: External ear normal.  ?   Left Ear: External ear normal.  ?   Mouth/Throat:  ?   Mouth: Mucous membranes are moist.  ?Eyes:  ?   General: No scleral icterus. ?Cardiovascular:  ?   Rate and Rhythm: Normal rate. Rhythm irregular.  ?   Pulses: Normal pulses.  ?   Heart sounds: Normal heart sounds.  ?Pulmonary:  ?   Effort: Pulmonary effort is normal. Tachypnea present. No  respiratory distress.  ?   Breath sounds: Examination of the right-lower field reveals rales. Examination of the left-lower field reveals rales. Decreased breath sounds and rales present.  ?Abdominal:  ?   General: Abdomen is fl

## 2022-02-13 NOTE — ED Triage Notes (Signed)
Pt sent over by PCP for evaluation of shob. Pt has wonds on his legs that are healing well per provider but patient is having issues with shob and possible fluid overload.  ? ?85% RA ? ?A/Ox4 ? ? ? ?

## 2022-02-13 NOTE — ED Notes (Signed)
Assisted patient into the bed. RA 78%, 5L [place] applied.  ?

## 2022-02-13 NOTE — Progress Notes (Addendum)
LEANDER, TOUT ([id number]) ?Visit Report for 02/13/2022 ?Chief Complaint Document Details ?Patient Name: Date of Service: ?[name], Taige J. 02/13/2022 10:30 A M ?Medical Record Number: [id number] ?Patient Account Number: [id number] ?Date of Birth/Sex: Treating RN: ?1951-05-02 (71 y.o. M) ?Primary Care Provider: Nicoletta Ba Other Clinician: ?Referring Provider: ?Treating Provider/Extender: Lenda Kelp ?Nicoletta Ba ?Weeks in Treatment: 316 ?Information Obtained from: Patient ?Chief Complaint ?Patient is here for evaluation of venous/lymphedema ulcers ?Electronic Signature(s) ?Signed: 02/13/2022 11:04:06 AM By: Lenda Kelp PA-C ?Entered By: Lenda Kelp on 02/13/2022 11:04:05 ?-------------------------------------------------------------------------------- ?HPI Details ?Patient Name: Date of Service: ?[name], Darcy J. 02/13/2022 10:30 A M ?Medical Record Number: [id number] ?Patient Account Number: [id number] ?Date of Birth/Sex: Treating RN: ?[birth year] (71 y.o. M) ?Primary Care Provider: Nicoletta Ba Other Clinician: ?Referring Provider: ?Treating Provider/Extender: Lenda Kelp ?Nicoletta Ba ?Weeks in Treatment: 316 ?History of Present Illness ?HPI Description: Referred by PCP for consultation. Patient has long standing history of BLE venous stasis, no prior ulcerations. At beginning of month, ?developed cellulitis and weeping. Received IM Rocephin followed by Keflex and resolved. Wears compression stocking, appr 6 months old. Not sure strength. ?No present drainage. ?01/22/16 this is a patient who is a type II diabetic on insulin. 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 He tells me he ?religiously wears pressure stockings of uncertain strength. He was here with weeping edema about 8 months ago but did not have an open wound. Roughly a ?month ago he had a reopening on his bilateral legs. He is been using bandages and Neosporin. He does not complain of pain. 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 Last BUN/creatinine I have is from 11/20/15 ?at 13 and 1.0 respectively ?01/29/16; patient arrives today having tolerated the Profore wrap. He brought in his stockings and these are 18 mmHg stockings he bought from [place]. The ?compression here is likely inadequate. He does not complain of pain or excessive drainage she has no systemic symptoms. The wound on the right looks ?improved as does the one on the left although one on the left is more substantial with still tissue at risk below the actual wound area on the bilateral posterior ?calf ?02/05/16; patient arrives with poor edema control. He states that we did put a 4 layer compression on it last week. No weight appear 5 this. ?02/12/16; the area on the posterior right Has healed. The left Has a substantial wound that has necrotic surface eschar that requires a debridement with a ?curette. ?02/16/16;the patient called or a Nurse visit secondary to increased swelling. He had been in earlier in the week with his right leg healed. He was transitioned to is ?on pressure stocking on the right leg with the only open wound on the left, a substantial area on the left posterior calf. Note he has a history of severe lower ?extremity edema, he has a history of chronic atrial fibrillation but not heart failure per my notes but I'll need to research this. He is not complaining of chest ?pain shortness of breath or orthopnea. The intake nurse noted blisters on the previously closed right leg ?02/19/16; this is the patient's regular visit day. I see him on Friday with escalating edema new wounds on the right leg and clear signs of at least right ventricular ?heart failure. I increased his Lasix to 40 twice a day. He is returning  currently in follow-up. States he is noticed a decrease in that the edema ?02/26/16 patient's legs have much less  edema. There is nothing really open on the right leg. The left leg has improved condition of the large superficial wound on ?the posterior left leg ?03/04/16; edema control is very much better. The patient's right leg wounds have healed. On the left leg he continues to have severe venous inflammation on ?the posterior aspect of the left leg. There is no tenderness and I don't think any of this is cellulitis. ?03/11/16; patient's right leg is married healed and he is in his own stocking. The patient's left leg has deteriorated somewhat. There is a lot of erythema around the ?wound on the posterior left leg. There is also a significant rim of erythema posteriorly just above where the wrap would've ended there is a new wound in this ?location and a lot of tenderness. Can't rule out cellulitis in this area. ?03/15/16; patient's right leg remains healed and he is in his own stocking. The patient's left leg is much better than last review. His major wound on the posterior ?aspect of his left Is almost fully epithelialized. He has 3 small injuries from the wraps. Really. Erythema seems a lot better on antibiotics ?03/18/16; right leg remains healed and he is in his own stocking. The patient's left leg is much better. The area on the posterior aspect of the left calf is fully ?epithelialized. His 3 small injuries which were wrap injuries on the left are improved only one seems still open his erythema has resolved ?03/25/16; patient's right leg remains healed and he is in his own stocking. There is no open area today on the left leg posterior leg is completely closed up. His ?wrap injuries at the superior aspect of his leg are also resolved. He looks as though he has some irritation on the dorsal ankle but this is fully epithelialized ?without evidence of infection. ?03/28/16; we discharged this patient on Monday. Transitioned him into his own stocking. There were problems almost immediately with uncontrolled swelling ?weeping edema  multiple some of which have opened. He does not feel systemically unwell in particular no chest pain no shortness of breath and he does not ?feel ?04/08/16; the edema is under better control with the Profore light wrap but he still has pitting edema. There is one large wound anteriorly 2 on the medial aspect of ?his left leg and 3 small areas on the superior posterior calf. Drainage is not excessive he is tolerating a Profore light well ?04/15/16; put a Profore wrap on him last week. This is controlled is edema however he had a lot of pain on his left anterior foot most of his wounds are healed ?04/22/16 once again the patient has denuded areas on the left anterior foot which he states are because his wrap slips up word. He saw his primary physician ?today is on Lasix 40 twice a day and states that he his weight is down 20 pounds over the last 3 months. ?04/29/16: Much improved. left anterior foot much improved. He is now on Lasix 80 mg per day. Much improved edema control ?05/06/16; I was hoping to be able to discharge him today however once again he has blisters at a low level of where the compression was placed last week ?mostly on his left lateral but also his left medial leg and a small area on the anterior part of the left foot. ?05/09/16; apparently the patient went home after his  appointment on 7/4 later in the evening developing pain in his upper medial thigh together with subjective ?fever and chills although his temperature was not taken. The pain was so intense he felt he would probably have to call 911. However he then remembered that ?he had leftover doxycycline from a previous round of antibiotics and took these. By the next morning he felt a lot better. He called and spoke to one of our ?nurses and I approved doxycycline over the phone thinking that this was in relation to the wounds we had previously seen although they were definitely were ?not. The patient feels a lot better old fever no chills he is still  working. Blood sugars are reasonably controlled ?05/13/16; patient is back in for review of his cellulitis on his anterior medial upper thigh. He is taking doxycycline this is a lot better. Culture I

## 2022-02-13 NOTE — ED Notes (Signed)
No urine output at this time since Lasix administration.  ?

## 2022-02-13 NOTE — H&P (Signed)
?History and Physical ? ?Patient Name: [name]     [id number]    DOB: August 03, 1951    DOA: 02/13/2022 ?PCP: Tammi Sou, MD  ?Patient coming from: Wound care clinic ? ?Chief Complaint: Dyspnea and weight gain ? ? ? ?HPI: [name] is a 71 y.o. male, with PMH of A-fib, HFpEF, hypertension, diabetes, morbid obesity, venous stasis, who presented to the ER on 02/13/2022 with weight gain and hypoxia. ? ?Patient states over the past month he has had progressive weight gain, approximately 25 pounds.  He says he typically weighs 375 pounds.  At home he is on 80 mg of p.o. Lasix daily.  He follows with cardiology for A-fib and HFpEF.  He was recently seen by cardiology approximately a month ago.  He is not on Lasix twice a day because he says this makes him urinate too much and he is unable to be active with urinating all the time.  He complains of dyspnea, especially on exertion.  He has chronic venous stasis of his lower extremities and goes to wound care.  He endorses worsening of his lower extreme edema.  He is not on oxygen at baseline.  He is currently staying at a hotel given issues with his apartment. ? ? ? ?ED course: ?-Vitals on admission: Afebrile, blood pressure 129/50, respiratory rate 20, heart rate in the 60s, O2 sat in the low 80s on room air. ?-Labs on initial presentation: Sodium 138, potassium 4.7, bicarb 25, glucose 124, BUN 29, creatinine 1.8, BNP 293, hemoglobin 10, WBC 6.8 ?-Imaging obtained on admission: Chest x-ray showed cardiomegaly and pulmonary vascular congestion. ?-In the ED the patient was given 40 mg of IV Lasix, and the hospitalist service was contacted for further evaluation and management. ? ? ? ? ?ROS: A complete and thorough 12 point review of systems obtained, negative listed in HPI. ? ? ? ? ?Past Medical History:  ?Diagnosis Date  ? ALLERGIC RHINITIS 08/11/2006  ? ASTHMA 08/11/2006  ? Bradycardia   ? Beta blocker d/c'd 2022  ? Chronic atrial fibrillation ([place])  08/2014  ? Chronic combined systolic and diastolic CHF (congestive heart failure) ([place])   ? Cardiology f/u 12/2017: pt volume overloaded (R heart dysf suspected), BNP very high, lasix increased.  Repeat echo 12/2017: normal LV EF, mild DD, +RV syst dysfxn, mod pulm HTN, biatrial enlgmt.  ? Chronic constipation   ? Chronic renal insufficiency, stage 3 (moderate) (H[facility])   ? Colon cancer screening 02/2021  ? 02/2021 Cologuard POS->GI ref  ? DIABETES MELLITUS, TYPE II 08/11/2006  ? HYPERTENSION 08/11/2006  ? Impaired mobility and endurance   ? MRSA infection 06/2018  ? LL venous stasis ulcer infected  ? Normocytic anemia 2016-2019  ? 03/2018 B12 normal, iron ok (ferritin borderline low).  ? OBESITY, MORBID 12/14/2007  ? saxenda started 05/2020 by [facility] wt mgmt center  ? OSA (obstructive sleep apnea)   ? to get sleep study with [facility]) as of 12/03/2018 consult.  ? 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 06/2017 Clindamycin suppression (hx of MRSA) caused diarrhea.  [id number] ID started him on amoxil prophylaxis---ineffective.  End 2018/Jan 2019 penicillin G injections prophyl helpful but pt declined to continue this as of 01/2018 ID f/u.  ID talked him into resuming monthly penicillin G as of 02/2018 f/u.    ? Restless leg syndrome   ? Rx'd clonazepam 09/2017 and pt refused to take it after reading the medication's potential side effects.  ?  Venous stasis ulcers of both lower extremities ([place])   ? Severe lymphedema.  wound clinic care ongoing as of 01/2018  ? ? ?Past Surgical History:  ?Procedure Laterality Date  ? Carotid dopplers  07/23/2018  ? Left NORMAL.  Right 1-39% ICA stenosis, with <50% distal CCA stenosis (not hemodynamically significant)  ? TRANSTHORACIC ECHOCARDIOGRAM  11/2007; 09/2014; 11/2015;12/2017  ? LV fxn normal, EF normal, mild dilation of left atrium.  2015 grade II diast dysfxn.  2017 EF 55-60%. 2019: LVEF 60-65%, mild RV syst dysf,biatrial enlgmt, mod pulm htn.  ? URETERAL STENT  PLACEMENT    ? virtual colonoscopy  01/2011  ? Normal  ? ? ?Social History: Patient lives currently at a hotel.  The patient walks with a walker.  nonsmoker. ? ?Allergies  ?Allergen Reactions  ? Other Other (See Comments)  ?  Sneezing, coughing  ? Hydralazine Other (See Comments)  ?  Drowsiness/sedation/mental fog  ? ? ?Family history: family history includes Diabetes in his mother and sister; Hydrocephalus in his sister. ? ?Prior to Admission medications   ?Medication Sig Start Date End Date Taking? Authorizing Provider  ?amLODipine (NORVASC) 10 MG tablet Take 1 tablet (10 mg total) by mouth daily. 08/03/21   McGowen, Adrian Blackwater, MD  ?arginine 500 MG tablet Take by mouth.    [provider]  ?b complex vitamins capsule Take 1 capsule by mouth every morning.    [provider]  ?butalbital-acetaminophen-caffeine (FIORICET WITH CODEINE) 50-325-40-30 MG capsule TAKE 1 TO 2 CAPSULES BY MOUTH EVERY 6 HOURS AS NEEDED FOR HEADACHES. NOT TO EXCEED 6 CAPSULES PER DAY 10/03/21   McGowen, Adrian Blackwater, MD  ?cetirizine (ZYRTEC) 10 MG tablet Take 10 mg by mouth daily.    [provider]  ?CHROMIUM GTF PO Take by mouth daily.    [provider]  ?Coenzyme Q10 (CO Q 10) 10 MG CAPS Take by mouth. Reported on 02/19/2016    [provider]  ?diltiazem (TIAZAC) 120 MG 24 hr capsule Take 1 capsule (120 mg total) by mouth daily. 09/14/21   McGowen, Adrian Blackwater, MD  ?furosemide (LASIX) 40 MG tablet TAKE 2 TABLETS BY MOUTH  TWICE DAILY 01/21/22   McGowen, Adrian Blackwater, MD  ?glucose blood (ONETOUCH ULTRA) test strip CHECK BLOOD SUGAR TWICE  DAILY 11/16/21   McGowen, Adrian Blackwater, MD  ?Glutamine 500 MG CAPS Take by mouth.    [provider]  ?HYDROcodone-acetaminophen (NORCO/VICODIN) 5-325 MG tablet 1-2 tabs po bid prn pain ?Patient taking differently: 1-2 tabs po bid prn pain at night 12/11/21   McGowen, Adrian Blackwater, MD  ?Insulin Lispro Prot & Lispro (HUMALOG MIX 75/25 KWIKPEN) (75-25) 100 UNIT/ML Kwikpen INJECT  SUBCUTANEOUSLY 25  UNITS EVERY MORNING AND 20  UNITS EVERY EVENING AT  SUPPER 09/21/21   McGowen, Adrian Blackwater, MD  ?Insulin Pen Needle (B-D ULTRAFINE III SHORT PEN) 31G X 8 MM MISC USE 1 PENNEEDLE TWICE DAILY 07/18/21   McGowen, Adrian Blackwater, MD  ?isosorbide mononitrate (IMDUR) 30 MG 24 hr tablet Take 1 tablet (30 mg total) by mouth daily. 10/10/21   McGowen, Adrian Blackwater, MD  ?metFORMIN (GLUCOPHAGE) 1000 MG tablet TAKE 1 TABLET BY MOUTH TWICE  DAILY WITH MEALS 01/22/22   McGowen, Adrian Blackwater, MD  ?Multiple Vitamin (MULTIVITAMIN) tablet Take 1 tablet by mouth daily.    [provider]  ?rivaroxaban (XARELTO) 20 MG TABS tablet TAKE 1 TABLET BY MOUTH ONCE DAILY WITH SUPPER 05/21/21   McGowen, Adrian Blackwater, MD  ?terazosin (HYTRIN)  10 MG capsule TAKE 1 CAPSULE BY MOUTH  ONCE DAILY AT BEDTIME 05/02/21   McGowen, Adrian Blackwater, MD  ?valsartan (DIOVAN) 320 MG tablet Take 1 tablet (320 mg total) by mouth daily. 09/19/21   McGowen, Adrian Blackwater, MD  ?vitamin E 400 UNIT capsule Take 400 Units by mouth every morning. Selenium 50mg     [provider]  ? ? ? ? ? ?Physical Exam: ?BP (!) 154/87   Pulse 65   Temp 98.4 ?F (36.9 ?C) (Oral)   Resp 16   Ht 5\' 10"  (1.778 m)   Wt (!) 181.4 kg   SpO2 99%   BMI 57.39 kg/m?  ? ?General appearance: Well-developed, adult male, alert and in no acute distress .   ?Eyes: Anicteric, conjunctiva pink, lids and lashes normal. PERRL.    ?ENT: No nasal deformity, discharge, epistaxis.  Hearing intact. OP moist without lesions.   ?Neck: No neck masses.  Trachea midline.  No thyromegaly/tenderness. ?Lymph: No cervical or supraclavicular lymphadenopathy. ?Skin: Warm and dry.  No jaundice.  No suspicious rashes or lesions. ?Cardiac: Irregularly irregular, nl S1-S2, no murmurs appreciated.  +++LE edema.  Radial and pedal pulses 2+ and symmetric. ?Respiratory: Normal respiratory rate and rhythm.  Diminished breath sounds bilaterally, no wheezing ?Abdomen: Abdomen soft.  No tenderness with palpation. No ascites,  distension, hepatosplenomegaly.   ?MSK: Significant bilateral lower extremity edema ?Neuro: Cranial nerves 2 through 12 grossly intact.  Sensation intact to light touch. Speech is fluent.  Marland Kitchen    ?Psych: [employer]

## 2022-02-13 NOTE — Progress Notes (Addendum)
ELISA, SORLIE ([id number]) ?Visit Report for 02/13/2022 ?Arrival Information Details ?Patient Name: Date of Service: ?[name], [name] 02/13/2022 10:30 A M ?Medical Record Number: [id number] ?Patient Account Number: [id number] ?Date of Birth/Sex: Treating RN: ?04-Jan-1951 (71 y.o. M) Elesa Hacker, [name] ?Primary Care Kaizlee Carlino: Nicoletta Ba Other Clinician: ?Referring Yordy Matton: ?Treating Nayelie Gionfriddo/Extender: Lenda Kelp ?Nicoletta Ba ?Weeks in Treatment: 316 ?Visit Information History Since Last Visit ?Added or deleted any medications: No ?Patient Arrived: [name] ?Any new allergies or adverse reactions: No ?Arrival Time: 10:30 ?Had a fall or experienced change in No ?Accompanied By: self ?activities of daily living that may affect ?Transfer Assistance: None ?risk of falls: ?Patient Identification Verified: Yes ?Signs or symptoms of abuse/neglect since last visito No ?Secondary Verification Process Completed: Yes ?Hospitalized since last visit: No ?Patient Requires Transmission-Based Precautions: No ?Implantable device outside of the clinic excluding No ?Patient Has Alerts: Yes ?cellular tissue based products placed in the center ?since last visit: ?Has Dressing in Place as Prescribed: Yes ?Has Compression in Place as Prescribed: Yes ?Pain Present Now: No ?Electronic Signature(s) ?Signed: 02/13/2022 5:47:39 PM By: Shawn Stall RN, BSN ?Entered By: Shawn Stall on 02/13/2022 10:51:55 ?-------------------------------------------------------------------------------- ?Compression Therapy Details ?Patient Name: Date of Service: ?[name], [name] 02/13/2022 10:30 A M ?Medical Record Number: [id number] ?Patient Account Number: [id number] ?Date of Birth/Sex: Treating RN: ?Mar 24, 1951 (71 y.o. M) Elesa Hacker, [name] ?Primary Care Gwyndolyn Guilford: Nicoletta Ba Other Clinician: ?Referring Kenzee Bassin: ?Treating Melrose Kearse/Extender: Lenda Kelp ?Nicoletta Ba ?Weeks in Treatment: 316 ?Compression Therapy Performed for Wound Assessment: Wound  #218 Right,Dorsal Foot ?Performed By: Clinician Shawn Stall, RN ?Compression Type: Four Layer ?Post Procedure Diagnosis ?Same as Pre-procedure ?Electronic Signature(s) ?Signed: 02/13/2022 5:47:39 PM By: Shawn Stall RN, BSN ?Entered By: Shawn Stall on 02/13/2022 11:12:05 ?-------------------------------------------------------------------------------- ?Compression Therapy Details ?Patient Name: ?Date of Service: ?[name], [name] 02/13/2022 10:30 A M ?Medical Record Number: [id number] ?Patient Account Number: [id number] ?Date of Birth/Sex: ?Treating RN: ?Jan 09, 1951 (71 y.o. M) Elesa Hacker, [name] ?Primary Care Rahkeem Senft: Nicoletta Ba ?Other Clinician: ?Referring Ash Mcelwain: ?Treating Alexsis Kathman/Extender: Lenda Kelp ?Nicoletta Ba ?Weeks in Treatment: 316 ?Compression Therapy Performed for Wound Assessment: NonWound Condition Lymphedema - Left Leg ?Performed By: Clinician Shawn Stall, RN ?Compression Type: Four Layer ?Post Procedure Diagnosis ?Same as Pre-procedure ?Electronic Signature(s) ?Signed: 02/13/2022 5:47:39 PM By: Shawn Stall RN, BSN ?Entered By: Shawn Stall on 02/13/2022 11:12:53 ?-------------------------------------------------------------------------------- ?Encounter Discharge Information Details ?Patient Name: ?Date of Service: ?[name], [name] 02/13/2022 10:30 A M ?Medical Record Number: [id number] ?Patient Account Number: [id number] ?Date of Birth/Sex: ?Treating RN: ?28-Mar-1951 (71 y.o. M) Elesa Hacker, [name] ?Primary Care Omar Gayden: Nicoletta Ba ?Other Clinician: ?Referring Dewarren Ledbetter: ?Treating Shalene Gallen/Extender: Lenda Kelp ?Nicoletta Ba ?Weeks in Treatment: 316 ?Encounter Discharge Information Items ?Discharge Condition: Stable ?Ambulatory Status: [name] ?Discharge Destination: Emergency Room ?Telephoned: No ?Orders Sent: Yes ?Transportation: Private Auto ?Accompanied By: self ?Schedule Follow-up Appointment: Yes ?Clinical Summary of Care: ?Notes ?Patient would like to go after wound care  visit today will drive self there r/t fluid overload and SHOB. ?Electronic Signature(s) ?Signed: 02/13/2022 5:47:39 PM By: Shawn Stall RN, BSN ?Entered By: Shawn Stall on 02/13/2022 11:36:26 ?-------------------------------------------------------------------------------- ?Lower Extremity Assessment Details ?Patient Name: ?Date of Service: ?[name], [name] 02/13/2022 10:30 A M ?Medical Record Number: [id number] ?Patient Account Number: [id number] ?Date of Birth/Sex: ?Treating RN: ?[birth year] (71 y.o. M) Elesa Hacker, [name] ?Primary Care Gerrie Castiglia: Nicoletta Ba ?Other Clinician: ?Referring Janai Maudlin: ?Treating Izsak Meir/Extender: Lenda Kelp ?Nicoletta Ba ?Weeks in Treatment: 316 ?Edema Assessment ?Assessed: [Left: Yes] [Right: Yes] ?Edema: [Left: Yes] [Right: Yes] ?  Calf ?Left: Right: ?Point of Measurement: 25 cm From Medial Instep 45 cm 41 cm ?Ankle ?Left: Right: ?Point of Measurement: 9 cm From Medial Instep 28 cm 27 cm ?Vascular Assessment ?Pulses: ?Dorsalis Pedis ?Palpable: [Left:No] [Right:No] ?Electronic Signature(s) ?Signed: 02/13/2022 5:47:39 PM By: Shawn Stall RN, BSN ?Entered By: Shawn Stall on 02/13/2022 10:53:45 ?-------------------------------------------------------------------------------- ?Multi-Disciplinary Care Plan Details ?Patient Name: ?Date of Service: ?[name], [name] 02/13/2022 10:30 A M ?Medical Record Number: [id number] ?Patient Account Number: [id number] ?Date of Birth/Sex: ?Treating RN: ?July 12, 1951 (71 y.o. M) Elesa Hacker, [name] ?Primary Care Allin Frix: Nicoletta Ba ?Other Clinician: ?Referring Kailyn Dubie: ?Treating Martina Brodbeck/Extender: Lenda Kelp ?Nicoletta Ba ?Weeks in Treatment: 316 ?Multidisciplinary Care Plan reviewed with physician ?Active Inactive ?Electronic Signature(s) ?Signed: 03/15/2022 8:04:13 AM By: Shawn Stall RN, BSN ?Previous Signature: 02/13/2022 5:47:39 PM Version By: Shawn Stall RN, BSN ?Entered By: Shawn Stall on 03/15/2022  08:04:10 ?-------------------------------------------------------------------------------- ?Pain Assessment Details ?Patient Name: ?Date of Service: ?[name], [name] 02/13/2022 10:30 A M ?Medical Record Number: [id number] ?Patient Account Number: [id number] ?Date of Birth/Sex: ?Treating RN: ?23-Jun-1951 (71 y.o. M) Elesa Hacker, [name] ?Primary Care Joby Hershkowitz: Nicoletta Ba ?Other Clinician: ?Referring Tanveer Dobberstein: ?Treating Lyndal Reggio/Extender: Lenda Kelp ?Nicoletta Ba ?Weeks in Treatment: 316 ?Active Problems ?Location of Pain Severity and Description of Pain ?Patient Has Paino No ?Site Locations ?Rate the pain. ?Current Pain Level: 0 ?Pain Management and Medication ?Current Pain Management: ?Medication: No ?Cold Application: No ?Rest: No ?Massage: No ?Activity: No ?T.E.N.S.: No ?Heat Application: No ?Leg drop or elevation: No ?Is the Current Pain Management Adequate: Adequate ?How does your wound impact your activities of daily livingo ?Sleep: No ?Bathing: No ?Appetite: No ?Relationship With Others: No ?Bladder Continence: No ?Emotions: No ?Bowel Continence: No ?Work: No ?Toileting: No ?Drive: No ?Dressing: No ?Hobbies: No ?Electronic Signature(s) ?Signed: 02/13/2022 5:47:39 PM By: Shawn Stall RN, BSN ?Entered By: Shawn Stall on 02/13/2022 10:53:19 ?-------------------------------------------------------------------------------- ?Patient/Caregiver Education Details ?Patient Name: ?Date of Service: ?[name], [name] 4/12/2023andnbsp10:30 A M ?Medical Record Number: [id number] ?Patient Account Number: [id number] ?Date of Birth/Gender: ?Treating RN: ?May 09, 1951 (71 y.o. M) Elesa Hacker, [name] ?Primary Care Physician: Nicoletta Ba ?Other Clinician: ?Referring Physician: ?Treating Physician/Extender: Lenda Kelp ?Nicoletta Ba ?Weeks in Treatment: 316 ?Education Assessment ?Education Provided To: ?Patient ?Education Topics Provided ?Wound/Skin Impairment: ?Handouts: Skin Care Do's and Dont's ?Methods:  Explain/Verbal ?Responses: Reinforcements needed ?Electronic Signature(s) ?Signed: 02/13/2022 5:47:39 PM By: Shawn Stall RN, BSN ?Entered By: Shawn Stall on 02/13/2022 10:58:46 ?-------------------------------------------------------------------------------- ?Wo

## 2022-02-14 ENCOUNTER — Inpatient Hospital Stay (HOSPITAL_COMMUNITY): Payer: Medicare Other

## 2022-02-14 DIAGNOSIS — I517 Cardiomegaly: Secondary | ICD-10-CM

## 2022-02-14 DIAGNOSIS — I5033 Acute on chronic diastolic (congestive) heart failure: Secondary | ICD-10-CM | POA: Diagnosis not present

## 2022-02-14 LAB — BASIC METABOLIC PANEL
Anion gap: 7 (ref 5–15)
BUN: 29 mg/dL — ABNORMAL HIGH (ref 8–23)
CO2: 28 mmol/L (ref 22–32)
Calcium: 8.4 mg/dL — ABNORMAL LOW (ref 8.9–10.3)
Chloride: 105 mmol/L (ref 98–111)
Creatinine, Ser: 1.66 mg/dL — ABNORMAL HIGH (ref 0.61–1.24)
GFR, Estimated: 44 mL/min — ABNORMAL LOW (ref >60)
Glucose, Bld: 138 mg/dL — ABNORMAL HIGH (ref 70–99)
Potassium: 4.4 mmol/L (ref 3.5–5.1)
Sodium: 140 mmol/L (ref 135–145)

## 2022-02-14 LAB — ECHOCARDIOGRAM COMPLETE
AR max vel: 2.43 cm2
AV Peak grad: 6.3 mmHg
Ao pk vel: 1.26 m/s
Area-P 1/2: 4.99 cm2
Height: 70 in
S' Lateral: 3.7 cm
Weight: 6578.53 oz

## 2022-02-14 LAB — CBC
HCT: 31.5 % — ABNORMAL LOW (ref 39.0–52.0)
Hemoglobin: 9.6 g/dL — ABNORMAL LOW (ref 13.0–17.0)
MCH: 23.1 pg — ABNORMAL LOW (ref 26.0–34.0)
MCHC: 30.5 g/dL (ref 30.0–36.0)
MCV: 75.9 fL — ABNORMAL LOW (ref 80.0–100.0)
Platelets: 240 10*3/uL (ref 150–400)
RBC: 4.15 MIL/uL — ABNORMAL LOW (ref 4.22–5.81)
RDW: 18.7 % — ABNORMAL HIGH (ref 11.5–15.5)
WBC: 6.5 10*3/uL (ref 4.0–10.5)
nRBC: 0 % (ref 0.0–0.2)

## 2022-02-14 LAB — GLUCOSE, CAPILLARY
Glucose-Capillary: 134 mg/dL — ABNORMAL HIGH (ref 70–99)
Glucose-Capillary: 111 mg/dL — ABNORMAL HIGH (ref 70–99)
Glucose-Capillary: 101 mg/dL — ABNORMAL HIGH (ref 70–99)
Glucose-Capillary: 146 mg/dL — ABNORMAL HIGH (ref 70–99)

## 2022-02-14 LAB — HIV ANTIBODY (ROUTINE TESTING W REFLEX): HIV Screen 4th Generation wRfx: NONREACTIVE

## 2022-02-14 LAB — PHOSPHORUS: Phosphorus: 4.2 mg/dL (ref 2.5–4.6)

## 2022-02-14 LAB — MAGNESIUM: Magnesium: 1.9 mg/dL (ref 1.7–2.4)

## 2022-02-14 MED ORDER — METOPROLOL TARTRATE 5 MG/5ML IV SOLN: 5.0000 mg | INTRAVENOUS | Status: DC | PRN

## 2022-02-14 MED ORDER — PERFLUTREN LIPID MICROSPHERE
1.0000 mL | INTRAVENOUS | Status: AC | PRN
  Administered 2022-02-14: 6 mL via INTRAVENOUS
  Filled 2022-02-14: qty 10

## 2022-02-14 MED ORDER — SENNOSIDES-DOCUSATE SODIUM 8.6-50 MG PO TABS: 1.0000 | ORAL_TABLET | Freq: Every evening | ORAL | Status: DC | PRN

## 2022-02-14 MED ORDER — GUAIFENESIN 100 MG/5ML PO LIQD
5.0000 mL | ORAL | Status: DC | PRN
  Administered 2022-03-01: 5 mL via ORAL
  Filled 2022-02-14: qty 10

## 2022-02-14 MED ORDER — HYDRALAZINE HCL 20 MG/ML IJ SOLN
10.0000 mg | INTRAMUSCULAR | Status: DC | PRN
Start: 2022-02-14 — End: 2022-02-20

## 2022-02-14 NOTE — Consult Note (Signed)
WOC Nurse Consult Note: ?Patient receiving care in [facility]. In completing this consult I spoke with his primary RN via telephone. ?Reason for Consult: BLE venous stasis ?Wound type: Per the [facility] notes from yesterday, the BLE were evaluated and 4 layer compression wraps applied.  There is no need to remove these until next week on 4/19 if he is still in hospital. ?I have placed the patient on the WOC follow/up list and the following order: Leave the wraps that are in place on the BLE intact, do not remove. If the patient is still in hospital on 4/19, the [facility] nurse will replace. ?Helmut Muster, RN, MSN, CWOCN, CNS-BC, pager [phone]  ?  ?

## 2022-02-14 NOTE — Evaluation (Signed)
Physical Therapy Evaluation ?Patient Details ?Name: [name] ?MRN: [id number] ?DOB: July 11, 1951 ?Today's Date: 02/14/2022 ? ?History of Present Illness ? Patient is 71 y.o. male presented to the ER on 02/13/2022 with weight gain and hypoxia. PMH significant for A-fib, HFpEF, HTN, DMII, morbid obesity, venous stasis, lymphedema. ? ?  ?Clinical Impression ? [name] is 71 y.o. male admitted with above HPI and diagnosis. Patient is currently limited by functional impairments below (see PT problem list). Patient lives alone and is modified independent with Rollator for mobility at baseline. Currently he is limited by fatigue and reduced endurance and required min guard for safety with transfers and gait. He is currently requiring 3L/min for O2 to maintain sats of 94% or greater with mobility. Patient will benefit from continued skilled PT interventions to address impairments and progress independence with mobility, recommending no follow up PT and pt to return to wound clinic when medically ready for discharge. Acute PT will follow and progress as able.  ?   ?   ? ?Recommendations for follow up therapy are one component of a multi-disciplinary discharge planning process, led by the attending physician.  Recommendations may be updated based on patient status, additional functional criteria and insurance authorization. ? ?Follow Up Recommendations No PT follow up ? ?  ?Assistance Recommended at Discharge PRN  ?Patient can return home with the following ?   ? ?  ?Equipment Recommendations None recommended by PT (pt needs brakes on rollator fixed)  ?Recommendations for Other Services ?    ?  ?Functional Status Assessment Patient has had a recent decline in their functional status and demonstrates the ability to make significant improvements in function in a reasonable and predictable amount of time.  ? ?  ?Precautions / Restrictions Precautions ?Precautions: Fall ?Restrictions ?Weight Bearing Restrictions: No   ? ?  ? ?Mobility ? Bed Mobility ?  ?  ?  ?  ?  ?  ?  ?General bed mobility comments: pt sitting at EOB at start of session ?  ? ?Transfers ?Overall transfer level: Needs assistance ?Equipment used: Rollator (4 wheels) ?Transfers: Sit to/from Stand ?Sit to Stand: Min guard, From elevated surface ?  ?  ?  ?  ?  ?General transfer comment: EOB slightly elevated, pt using momentum to rock and initiate power up to stand. No assist but guarding for safety. ?  ? ?Ambulation/Gait ?Ambulation/Gait assistance: Min guard ?Gait Distance (Feet): 80 Feet (2x40) ?Assistive device: Rollator (4 wheels) ?Gait Pattern/deviations: Step-through pattern, Decreased stride length, Shuffle, Wide base of support ?Gait velocity: decr ?  ?  ?General Gait Details: pt requires min guard for safety with gait, distance limited by fatigue and DOE, Pt on 3L/min and SpO2 remained 94% or greater with activity. seated rest provided halfway through gait. ? ?Stairs ?  ?  ?  ?  ?  ? ?Wheelchair Mobility ?  ? ?Modified Rankin (Stroke Patients Only) ?  ? ?  ? ?Balance Overall balance assessment: Needs assistance ?Sitting-balance support: Feet supported ?Sitting balance-Leahy Scale: Good ?  ?  ?Standing balance support: Reliant on assistive device for balance, During functional activity, Bilateral upper extremity supported ?Standing balance-Leahy Scale: Poor ?  ?  ?  ?  ?  ?  ?  ?  ?  ?  ?  ?  ?   ? ? ? ?Pertinent Vitals/Pain Pain Assessment ?Pain Assessment: No/denies pain  ? ? ?Home Living Family/patient expects to be discharged to:: Other (Comment) (motel) ?Living Arrangements: Alone ?  ?  Type of Home: Apartment ?Home Access: Level entry ?  ?  ?  ?Home Layout: One level ?Home Equipment: Rollator (4 wheels);Hand held shower head;Grab bars - tub/shower;Grab bars - toilet (LE compression pump) ?Additional Comments: pt is staying at a motel currently, lost his apartment ~ 6 months ago. He is waiting for an apartment to open in [place].  ?  ?Prior Function  Prior Level of Function : Independent/Modified Independent ?  ?  ?  ?  ?  ?  ?Mobility Comments: pt is ambulating limited distance with rollator, uses seat for breaks. pt is still driving, doing food shopping with use of scooter. ?ADLs Comments: pt reports mod indepdence with ADL's, he puts shower boots on and uses grab bar and handheld shower to step into tub and wash off. ?  ? ? ?Hand Dominance  ? Dominant Hand: Right ? ?  ?Extremity/Trunk Assessment  ? Upper Extremity Assessment ?Upper Extremity Assessment: Overall WFL for tasks assessed ?  ? ?Lower Extremity Assessment ?Lower Extremity Assessment: Generalized weakness ?  ? ?Cervical / Trunk Assessment ?Cervical / Trunk Assessment: Other exceptions ?Cervical / Trunk Exceptions: habitus  ?Communication  ? Communication: No difficulties  ?Cognition Arousal/Alertness: Awake/alert ?Behavior During Therapy: [facility] for tasks assessed/performed ?Overall Cognitive Status: Within Functional Limits for tasks assessed ?  ?  ?  ?  ?  ?  ?  ?  ?  ?  ?  ?  ?  ?  ?  ?  ?  ?  ?  ? ?  ?General Comments   ? ?  ?Exercises    ? ?Assessment/Plan  ?  ?PT Assessment Patient needs continued PT services  ?PT Problem List Decreased strength;Decreased activity tolerance;Decreased balance;Decreased mobility;Decreased knowledge of use of DME;Decreased knowledge of precautions;Obesity;Decreased safety awareness ? ?   ?  ?PT Treatment Interventions DME instruction;Gait training;Stair training;Therapeutic activities;Functional mobility training;Therapeutic exercise;Balance training;Neuromuscular re-education;Patient/family education   ? ?PT Goals (Current goals can be found in the Care Plan section)  ?Acute Rehab PT Goals ?Patient Stated Goal: get rollator brakes fixed and get rid of fluid ?PT Goal Formulation: With patient ?Time For Goal Achievement: 02/28/22 ?Potential to Achieve Goals: Good ? ?  ?Frequency Min 3X/week ?  ? ? ?Co-evaluation   ?  ?  ?  ?  ? ? ?  ?AM-PAC PT "6 Clicks" Mobility   ?Outcome Measure Help needed turning from your back to your side while in a flat bed without using bedrails?: A Little ?Help needed moving from lying on your back to sitting on the side of a flat bed without using bedrails?: A Little ?Help needed moving to and from a bed to a chair (including a wheelchair)?: A Little ?Help needed standing up from a chair using your arms (e.g., wheelchair or bedside chair)?: A Little ?Help needed to walk in hospital room?: A Little ?Help needed climbing 3-5 steps with a railing? : Total ?6 Click Score: 16 ? ?  ?End of Session Equipment Utilized During Treatment: Oxygen ?Activity Tolerance: Patient tolerated treatment well ?Patient left: in bed;with call bell/phone within reach (seated EOB) ?Nurse Communication: Mobility status ?PT Visit Diagnosis: Unsteadiness on feet (R26.81);Muscle weakness (generalized) (M62.81);Difficulty in walking, not elsewhere classified (R26.2) ?  ? ?Time: [id number] ?PT Time Calculation (min) (ACUTE ONLY): 27 min ? ? ?Charges:   PT Evaluation ?$PT Eval Low Complexity: 1 Low ?PT Treatments ?$Gait Training: 8-22 mins ?  ?   ? ? ?Gwynneth Albright PT, DPT ?Acute Rehabilitation Services ?Office [phone] ?Pager  [phone]  ? ?Jacques Navy ?02/14/2022, 1:56 PM ? ?

## 2022-02-14 NOTE — Consult Note (Addendum)
?Cardiology Consultation:  ? ?[name] ID: [name] ?MRN: [id number]; DOB: 1951/02/16 ? ?Admit date: 02/13/2022 ?Date of Consult: 02/14/2022 ? ?PCP:  Tammi Sou, MD ?  ?[facility] Providers ?Cardiologist:  Pixie Casino, MD  ?Cardiology APP:  Almyra Deforest, PA   ? ? ?[name] Profile:  ? ?[name] is a 71 y.o. male with a hx of chronic atrial fibrillatin, chronic combined CHF, HTN, DM type 2, CKD stage 3a, RLS, OSA who is being seen 02/14/2022 for the evaluation of CHF at the request of Dr. Reesa Chew. ? ?History of Present Illness:  ? ?[name] is a 71 year old male with above medical history who is followed by Dr. Debara Pickett. Per chart review, [name] was referred to Cardiology in 2015 for evaluation of atrial flutter/fibrillation, evaluation of ischemia. [name] underwent nuclear stress test on 10/05/2014 that was an intermediate risk stress test, EF 42%, negative for ischemia. [name] later developed worsening SOB and had an echocardiogram completed on 11/07/2015 that showed LVEF 55-60%, normal wall thickness, moderately dilated LA.  ? ?[name] was seen again in follow up in 12/2017. [name] was complaining of weight gain, lower extremity edema, lower extremity wounds. Was diagnosed with venous insufficiency of both legs. Echocardiogram on 12/29/2017 showed LVEF 60-65%, no regional wall motion abnormalities, normal wall thickness, diastolic flattening of the ventricular septum, mildly reduced RV function, moderately-severely dilated RA, moderatley increased pulmonary artery systolic pressure (PA peak pressure 53 mmHg). Thought that [name]'s symptoms were caused by his mildly reduced RV systolic function, morbid obesity, and upper airway resistance syndrome.  ? ?[name] was most recently seen by cardiology on 01/15/22. At that time, [name] was continuing to struggle with lower extremity edema and poorly healing wounds. He had recently been evicted from his apartment and was living in a motel.  ? ?[name]  presented to the ED on 4/12 at the instruction of his PCP. [name] was seen by his PCP earlier that day and reported SOB. [name] was found to be hypoxic at 85% on room air. [name] was admitted to the hospitalist service for treatment of acute hypoxic respiratory failure secondary to HFpEF exacerbation.  ?Labs include Na 138, K 4.7, creatinine 1.80, eGFR 40, hemoglobin 10.0, WBC 6.8, platelets 235. BNP elevated to 293.8. hsTn 9>>9.  ?EKG showed atrial fibrillation with a HR 65, RBBB, LPFB. 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  ?CXR showed cardiomegaly, pulmonary vascular congestion, hazy bilateral opacities that may represent mild edema.  ? ?On interview, [name] reports that he has been having progressive sob on exertion for a month. Notices that he cannot walk even a few feet without having sob. Also endorses orthopnea, denies PND. Denies chest pain. Has chronic lower extremity edema, and he is unable to tell if it is changed. Believes that his dry weight is around 375 lbs and noticed that he has been gaining weight and is over 400 lbs. Complaint with medications. Had a UTI last week and has not finished his antibiotics. Does not wear oxygen at home but is on 3L via [place] here.  ? ?Past Medical History:  ?Diagnosis Date  ? ALLERGIC RHINITIS 08/11/2006  ? ASTHMA 08/11/2006  ? Bradycardia   ? Beta blocker d/c'd 2022  ? Chronic atrial fibrillation ([place]) 08/2014  ? Chronic combined systolic and diastolic CHF (congestive heart failure) ([place])   ? Cardiology f/u 12/2017: pt volume overloaded (R heart dysf suspected), BNP very high, lasix increased.  Repeat echo 12/2017: normal LV EF, mild DD, +RV syst  dysfxn, mod pulm HTN, biatrial enlgmt.  ? Chronic constipation   ? Chronic renal insufficiency, stage 3 (moderate) (HCC)   ? Colon cancer screening 02/2021  ? 02/2021 Cologuard POS->GI ref  ? DIABETES MELLITUS, TYPE II 08/11/2006  ? HYPERTENSION 08/11/2006  ? Impaired mobility and endurance   ? MRSA infection 06/2018  ? LL venous  stasis ulcer infected  ? Normocytic anemia 2016-2019  ? 03/2018 B12 normal, iron ok (ferritin borderline low).  ? OBESITY, MORBID 12/14/2007  ? saxenda started 05/2020 by [facility] wt mgmt center  ? OSA (obstructive sleep apnea)   ? to get sleep study with [facility]) as of 12/03/2018 consult.  ? 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 06/2017 Clindamycin suppression (hx of MRSA) caused diarrhea.  [id number] ID started him on amoxil prophylaxis---ineffective.  End 2018/Jan 2019 penicillin G injections prophyl helpful but pt declined to continue this as of 01/2018 ID f/u.  ID talked him into resuming monthly penicillin G as of 02/2018 f/u.    ? Restless leg syndrome   ? Rx'd clonazepam 09/2017 and pt refused to take it after reading the medication's potential side effects.  ? Venous stasis ulcers of both lower extremities ([place])   ? Severe lymphedema.  wound clinic care ongoing as of 01/2018  ? ? ?Past Surgical History:  ?Procedure Laterality Date  ? Carotid dopplers  07/23/2018  ? Left NORMAL.  Right 1-39% ICA stenosis, with <50% distal CCA stenosis (not hemodynamically significant)  ? TRANSTHORACIC ECHOCARDIOGRAM  11/2007; 09/2014; 11/2015;12/2017  ? LV fxn normal, EF normal, mild dilation of left atrium.  2015 grade II diast dysfxn.  2017 EF 55-60%. 2019: LVEF 60-65%, mild RV syst dysf,biatrial enlgmt, mod pulm htn.  ? URETERAL STENT PLACEMENT    ? virtual colonoscopy  01/2011  ? Normal  ?  ? ?Home Medications:  ?Prior to Admission medications   ?Medication Sig Start Date End Date Taking? Authorizing Provider  ?amLODipine (NORVASC) 10 MG tablet Take 1 tablet (10 mg total) by mouth daily. 08/03/21  Yes McGowen, Adrian Blackwater, MD  ?b complex vitamins capsule Take 1 capsule by mouth every morning.   Yes [provider]  ?butalbital-acetaminophen-caffeine (FIORICET WITH CODEINE) 50-325-40-30 MG capsule TAKE 1 TO 2 CAPSULES BY MOUTH EVERY 6 HOURS AS NEEDED FOR HEADACHES. NOT TO EXCEED 6 CAPSULES PER DAY ?[name]  taking differently: Take 1-2 capsules by mouth every 6 (six) hours as needed for headache. NOT TO EXCEED 6 CAPSULES PER DAY 10/03/21  Yes McGowen, Adrian Blackwater, MD  ?cetirizine (ZYRTEC) 10 MG tablet Take 10 mg by mouth at bedtime.   Yes [provider]  ?CHROMIUM GTF PO Take 1 tablet by mouth daily.   Yes [provider]  ?Coenzyme Q10 (CO Q 10) 10 MG CAPS Take 400 mg by mouth daily. Reported on 02/19/2016   Yes [provider]  ?diltiazem (TIAZAC) 120 MG 24 hr capsule Take 1 capsule (120 mg total) by mouth daily. 09/14/21  Yes McGowen, Adrian Blackwater, MD  ?furosemide (LASIX) 40 MG tablet TAKE 2 TABLETS BY MOUTH  TWICE DAILY ?[name] taking differently: Take 80 mg by mouth daily. 01/21/22  Yes McGowen, Adrian Blackwater, MD  ?HYDROcodone-acetaminophen (NORCO/VICODIN) 5-325 MG tablet 1-2 tabs po bid prn pain ?[name] taking differently: Take 1 tablet by mouth at bedtime. 12/11/21  Yes McGowen, Adrian Blackwater, MD  ?Insulin Lispro Prot & Lispro (HUMALOG MIX 75/25 KWIKPEN) (75-25) 100 UNIT/ML Kwikpen INJECT SUBCUTANEOUSLY 25  UNITS EVERY MORNING AND 20  UNITS EVERY EVENING AT  SUPPER ?[name] taking differently: 20-34 Units 2 (two) times daily. Sliding scale 09/21/21  Yes McGowen, Adrian Blackwater, MD  ?isosorbide mononitrate (IMDUR) 30 MG 24 hr tablet Take 1 tablet (30 mg total) by mouth daily. 10/10/21  Yes McGowen, Adrian Blackwater, MD  ?metFORMIN (GLUCOPHAGE) 1000 MG tablet TAKE 1 TABLET BY MOUTH TWICE  DAILY WITH MEALS ?[name] taking differently: Take 1,000 mg by mouth 2 (two) times daily with a meal. 01/22/22  Yes McGowen, Adrian Blackwater, MD  ?Multiple Vitamin (MULTIVITAMIN) tablet Take 1 tablet by mouth daily.   Yes [provider]  ?rivaroxaban (XARELTO) 20 MG TABS tablet TAKE 1 TABLET BY MOUTH ONCE DAILY WITH SUPPER ?[name] taking differently: 20 mg daily with supper. 05/21/21  Yes McGowen, Adrian Blackwater, MD  ?terazosin (HYTRIN) 10 MG capsule TAKE 1 CAPSULE BY MOUTH  ONCE DAILY AT BEDTIME ?[name] taking differently: Take 10 mg by  mouth at bedtime. 05/02/21  Yes McGowen, Adrian Blackwater, MD  ?valsartan (DIOVAN) 320 MG tablet Take 1 tablet (320 mg total) by mouth daily. 09/19/21  Yes McGowen, Adrian Blackwater, MD  ?vitamin E 400 UNIT capsule [place]

## 2022-02-14 NOTE — Progress Notes (Signed)
?PROGRESS NOTE ? ? ? ?[name]  [id number] DOB: 1950/12/13 DOA: 02/13/2022 ?PCP: Tammi Sou, MD  ? ?Brief Narrative:  ?71 y.o. male, with PMH of A-fib, HFpEF, hypertension, diabetes, morbid obesity, venous stasis, who presented to the ER on 02/13/2022 with weight gain and hypoxia.  Patient was found to be in CHF exacerbation started on IV Lasix.  Chest x-ray showed cardiomegaly and pulmonary vascular congestion. ? ? ?Assessment & Plan: ? Principal Problem: ?  Acute on chronic diastolic CHF (congestive heart failure) ([place]) ?Active Problems: ?  DM2 (diabetes mellitus, type 2) ([place]) ?  Diabetic renal disease ([place]) ?  Hypertension, essential ?  Lymphedema of both lower extremities ?  Atrial fibrillation ([place]) ?  Acute respiratory failure with hypoxia ([place]) ?  ?Acute hypoxic respiratory distress ?Acute diastolic congestive heart failure with preserved ejection fraction, class IV ?- On room air patient was saturating 80% not requiring supplemental oxygen.  Lasix IV twice daily ordered.  Monitor electrolytes and replete as necessary.  Echo in 2019 showed EF of 65%. ?- Echocardiogram ?- Fluid restriction, strict input and output ? ?Chronic atrial fibrillation ?- On Cardizem and Xarelto. ? ?Diabetes mellitus type 2 ?- Continue home regimen. Sliding scale and Accu-Cheks. ? ?CKD stage IIIb ?- Creatinine baseline around 1.5, continue to monitor with diuresis ? ?Chronic bilateral venous stasis wound, present on admission ?- Wound care nurse consulted-maintain current dressing until 4/19, if he stays past it wound care team will follow-up. ?Leave the wraps that are in place on the BLE intact until 4/19, do not remove. ?  ?DVT prophylaxis: Xarelto ?Code Status: Full  ?Family Communication:   ? ?Status is: Inpatient ?Remains inpatient appropriate because: Patient is quite volume overloaded he will need aggressive IV diuretics.  I expect him to stay in the hospital for at least next 2-4 days.  Cardiology team will  be consulted. ? ? ? ?Subjective: ?Patient still feels shortness of breath with minimal exertion.  Typically does not use oxygen at home, currently on 2 L nasal cannula.  He desaturates down to 89% on room air and gets dyspneic with minimal ambulation. ? ? ?Examination: ? ?General exam: Appears calm and comfortable, morbid obesity.  On 3 L nasal cannula. ?Respiratory system: Bilateral diminished breath sounds ?Cardiovascular system: S1 & S2 heard, RRR. No JVD, murmurs, rubs, gallops or clicks. No pedal edema. ?Gastrointestinal system: Abdomen is nondistended, soft and nontender. No organomegaly or masses felt. Normal bowel sounds heard. ?Central nervous system: Alert and oriented. No focal neurological deficits. ?Extremities: Symmetric 5 x 5 power. ?Skin: Bilateral lower extremity dressing in place ?Psychiatry: Judgement and insight appear normal. Mood & affect appropriate.  ? ? ? ?Objective: ?Vitals:  ? 02/13/22 2001 02/13/22 2036 02/14/22 0056 02/14/22 0451  ?BP: (!) 134/47 (!) 135/51 (!) 110/54 (!) 100/50  ?Pulse: 75 82 65 (!) 57  ?Resp: 19 14 14 14   ?Temp: 98.1 ?F (36.7 ?C) 98 ?F (36.7 ?C) 97.9 ?F (36.6 ?C) 97.8 ?F (36.6 ?C)  ?TempSrc:  Oral Oral Oral  ?SpO2: 96% 98% 96% 92%  ?Weight:  (!) 186.5 kg    ?Height:      ? ?No intake or output data in the 24 hours ending 02/14/22 0817 ?Filed Weights  ? 02/13/22 1529 02/13/22 2036  ?Weight: (!) 181.4 kg (!) 186.5 kg  ? ? ? ?Data Reviewed:  ? ?CBC: ?Recent Labs  ?Lab 02/13/22 ?1600 02/14/22 ?[id number]  ?WBC 6.8 6.5  ?NEUTROABS 4.9  --   ?HGB 10.0*  9.6*  ?HCT 32.6* 31.5*  ?MCV 74.9* 75.9*  ?PLT 235 240  ? ?Basic Metabolic Panel: ?Recent Labs  ?Lab 02/13/22 ?1600 02/14/22 ?[id number]  ?NA 138 140  ?K 4.7 4.4  ?CL 105 105  ?CO2 25 28  ?GLUCOSE 124* 138*  ?BUN 29* 29*  ?CREATININE 1.80* 1.66*  ?CALCIUM 8.6* 8.4*  ?MG  --  1.9  ?PHOS  --  4.2  ? ?GFR: ?Estimated Creatinine Clearance: 69.3 mL/min (A) (by C-G formula based on SCr of 1.66 mg/dL (H)). ?Liver Function Tests: ?No results for  input(s): AST, ALT, ALKPHOS, BILITOT, PROT, ALBUMIN in the last 168 hours. ?No results for input(s): LIPASE, AMYLASE in the last 168 hours. ?No results for input(s): AMMONIA in the last 168 hours. ?Coagulation Profile: ?No results for input(s): INR, PROTIME in the last 168 hours. ?Cardiac Enzymes: ?No results for input(s): CKTOTAL, CKMB, CKMBINDEX, TROPONINI in the last 168 hours. ?BNP (last 3 results) ?No results for input(s): PROBNP in the last 8760 hours. ?HbA1C: ?No results for input(s): HGBA1C in the last 72 hours. ?CBG: ?Recent Labs  ?Lab 02/13/22 ?2125 02/14/22 ?0730  ?GLUCAP 174* 134*  ? ?Lipid Profile: ?No results for input(s): CHOL, HDL, LDLCALC, TRIG, CHOLHDL, LDLDIRECT in the last 72 hours. ?Thyroid Function Tests: ?No results for input(s): TSH, T4TOTAL, FREET4, T3FREE, THYROIDAB in the last 72 hours. ?Anemia Panel: ?No results for input(s): VITAMINB12, FOLATE, FERRITIN, TIBC, IRON, RETICCTPCT in the last 72 hours. ?Sepsis Labs: ?No results for input(s): PROCALCITON, LATICACIDVEN in the last 168 hours. ? ?Recent Results (from the past 240 hour(s))  ?Resp Panel by RT-PCR (Flu A&B, Covid) Nasopharyngeal Swab     Status: None  ? Collection Time: 02/13/22  6:26 PM  ? Specimen: Nasopharyngeal Swab; Nasopharyngeal(NP) swabs in vial transport medium  ?Result Value Ref Range Status  ? SARS Coronavirus 2 by RT PCR NEGATIVE NEGATIVE Final  ?  Comment: (NOTE) ?SARS-CoV-2 target nucleic acids are NOT DETECTED. ? ?The SARS-CoV-2 RNA is generally detectable in upper respiratory ?specimens during the acute phase of infection. The lowest ?concentration of SARS-CoV-2 viral copies this assay can detect is ?138 copies/mL. A negative result does not preclude SARS-Cov-2 ?infection and should not be used as the sole basis for treatment or ?other patient management decisions. A negative result may occur with  ?improper specimen collection/handling, submission of specimen other ?than nasopharyngeal swab, presence of viral  mutation(s) within the ?areas targeted by this assay, and inadequate number of viral ?copies(<138 copies/mL). A negative result must be combined with ?clinical observations, patient history, and epidemiological ?information. The expected result is Negative. ? ?Fact Sheet for Patients:  ?EntrepreneurPulse.com.au ? ?Fact Sheet for Healthcare Providers:  ?IncredibleEmployment.be ? ?This test is no t yet approved or cleared by the [place] FDA and  ?has been authorized for detection and/or diagnosis of SARS-CoV-2 by ?FDA under an Emergency Use Authorization (EUA). This EUA will remain  ?in effect (meaning this test can be used) for the duration of the ?COVID-19 declaration under Section 564(b)(1) of the Act, 21 ?U.S.C.section 360bbb-3(b)(1), unless the authorization is terminated  ?or revoked sooner.  ? ? ?  ? Influenza A by PCR NEGATIVE NEGATIVE Final  ? Influenza B by PCR NEGATIVE NEGATIVE Final  ?  Comment: (NOTE) ?The Xpert Xpress SARS-CoV-2/FLU/RSV plus assay is intended as an aid ?in the diagnosis of influenza from Nasopharyngeal swab specimens and ?should not be used as a sole basis for treatment. Nasal washings and ?aspirates are unacceptable for Xpert Xpress SARS-CoV-2/FLU/RSV ?testing. ? ?  Fact Sheet for Patients: ?EntrepreneurPulse.com.au ? ?Fact Sheet for Healthcare Providers: ?IncredibleEmployment.be ? ?This test is not yet approved or cleared by the [place] FDA and ?has been authorized for detection and/or diagnosis of SARS-CoV-2 by ?FDA under an Emergency Use Authorization (EUA). This EUA will remain ?in effect (meaning this test can be used) for the duration of the ?COVID-19 declaration under Section 564(b)(1) of the Act, 21 U.S.C. ?section 360bbb-3(b)(1), unless the authorization is terminated or ?revoked. ? ?Performed at [facility], [address] ?[place],  [id number] ?  ?  ? ? ? ? ? ?Radiology  Studies: ?DG Chest 2 View ? ?Result Date: 02/13/2022 ?CLINICAL DATA:  shob EXAM: CHEST - 2 VIEW COMPARISON:  10/24/2015. FINDINGS: Lordotic positioning and patient body habitus limits evaluation. Enlarged ca

## 2022-02-15 DIAGNOSIS — I5033 Acute on chronic diastolic (congestive) heart failure: Secondary | ICD-10-CM | POA: Diagnosis not present

## 2022-02-15 LAB — URINALYSIS, ROUTINE W REFLEX MICROSCOPIC
Bilirubin Urine: NEGATIVE
Glucose, UA: NEGATIVE mg/dL
Ketones, ur: NEGATIVE mg/dL
Nitrite: NEGATIVE
Protein, ur: NEGATIVE mg/dL
Specific Gravity, Urine: 1.008 (ref 1.005–1.030)
WBC, UA: >50 WBC/hpf — ABNORMAL HIGH (ref 0–5)
pH: 5 (ref 5.0–8.0)

## 2022-02-15 LAB — BASIC METABOLIC PANEL
Anion gap: 7 (ref 5–15)
BUN: 29 mg/dL — ABNORMAL HIGH (ref 8–23)
CO2: 28 mmol/L (ref 22–32)
Calcium: 8.3 mg/dL — ABNORMAL LOW (ref 8.9–10.3)
Chloride: 105 mmol/L (ref 98–111)
Creatinine, Ser: 1.6 mg/dL — ABNORMAL HIGH (ref 0.61–1.24)
GFR, Estimated: 46 mL/min — ABNORMAL LOW (ref >60)
Glucose, Bld: 136 mg/dL — ABNORMAL HIGH (ref 70–99)
Potassium: 4.1 mmol/L (ref 3.5–5.1)
Sodium: 140 mmol/L (ref 135–145)

## 2022-02-15 LAB — GLUCOSE, CAPILLARY
Glucose-Capillary: 137 mg/dL — ABNORMAL HIGH (ref 70–99)
Glucose-Capillary: 121 mg/dL — ABNORMAL HIGH (ref 70–99)
Glucose-Capillary: 109 mg/dL — ABNORMAL HIGH (ref 70–99)
Glucose-Capillary: 167 mg/dL — ABNORMAL HIGH (ref 70–99)

## 2022-02-15 LAB — MAGNESIUM: Magnesium: 1.9 mg/dL (ref 1.7–2.4)

## 2022-02-15 MED ORDER — FUROSEMIDE 10 MG/ML IJ SOLN
80.0000 mg | Freq: Three times a day (TID) | INTRAMUSCULAR | Status: DC
  Administered 2022-02-15 – 2022-02-17 (×6): 80 mg via INTRAVENOUS
  Filled 2022-02-15 (×6): qty 8

## 2022-02-15 MED ORDER — METOLAZONE 5 MG PO TABS
5.0000 mg | ORAL_TABLET | Freq: Every day | ORAL | Status: DC
  Administered 2022-02-15 – 2022-02-18 (×4): 5 mg via ORAL
  Filled 2022-02-15 (×4): qty 1

## 2022-02-15 NOTE — TOC Initial Note (Signed)
Transition of Care (TOC) - Initial/Assessment Note  ? ? ?Patient Details  ?Name: [name] ?MRN: [id number] ?Date of Birth: 1951-01-08 ? ?Transition of Care (TOC) CM/SW Contact:    ?Solyana Nonaka, LCSW ?Phone Number: ?02/15/2022, 2:33 PM ? ?Clinical Narrative:                 ?Met with pt today to review dc plans and check if any anticipated dc needs.  Pt confirms that he is currently staying at a hotel while his [place] receives needed upgrade/ maintenance.  He is grateful for support that he has received from the [facility] congregational nursing program which has assisted him with housing/ hotel costs and notes he is hopeful to return to his apartment soon (but does not expect it to be ready by hospital dc).  Pt, also, confirms that he has his own vehicle for transportation and is followed at the Wound care clinic for LE wound care.  He does not really have any concerns about hospital dc but notes he will need oxygen if unable to wean while here.  Will ask weekend TOC to monitor for possible O2 need. ? ?Expected Discharge Plan: Home/Self Care ?Barriers to Discharge: Continued Medical Work up ? ? ?Patient Goals and CMS Choice ?Patient states their goals for this hospitalization and ongoing recovery are:: hopes to wean off of O2 ?  ?  ? ?Expected Discharge Plan and Services ?Expected Discharge Plan: Home/Self Care ?  ?  ?  ?Living arrangements for the past 2 months: Hotel/Motel ?                ?  ?  ?  ?  ?  ?  ?  ?  ?  ?  ? ?Prior Living Arrangements/Services ?Living arrangements for the past 2 months: Hotel/Motel ?Lives with:: Self ?Patient language and need for interpreter reviewed:: Yes ?Do you feel safe going back to the place where you live?: Yes      ?Need for Family Participation in Patient Care: No (Comment) ?Care giver support system in place?: No (comment) ?  ?Criminal Activity/Legal Involvement Pertinent to Current Situation/Hospitalization: No - Comment as needed ? ?Activities of Daily Living ?Home  Assistive Devices/Equipment: [name] (specify type), Cane (specify quad or straight) (2 surgical shoes) ?ADL Screening (condition at time of admission) ?Patient's cognitive ability adequate to safely complete daily activities?: Yes ?Is the patient deaf or have difficulty hearing?: No ?Does the patient have difficulty seeing, even when wearing glasses/contacts?: No ?Does the patient have difficulty concentrating, remembering, or making decisions?: No ?Patient able to express need for assistance with ADLs?: Yes ?Does the patient have difficulty dressing or bathing?: No ?Independently performs ADLs?: No ?Communication: Independent ?Dressing (OT): Independent ?Grooming: Independent ?Feeding: Independent ?Bathing: Independent ?Toileting: Needs assistance ?Is this a change from baseline?: Pre-admission baseline ?In/Out Bed: Needs assistance ?Is this a change from baseline?: Pre-admission baseline ?Walks in Home: Needs assistance ?Is this a change from baseline?: Pre-admission baseline ?Does the patient have difficulty walking or climbing stairs?: Yes ?Weakness of Legs: None ?Weakness of Arms/Hands: None ? ?Permission Sought/Granted ?  ?  ?   ?   ?   ?   ? ?Emotional Assessment ?Appearance:: Appears stated age ?Attitude/Demeanor/Rapport: Gracious ?Affect (typically observed): Accepting ?Orientation: : Oriented to Self, Oriented to Place, Oriented to  Time, Oriented to Situation ?Alcohol / Substance Use: Not Applicable ?Psych Involvement: No (comment) ? ?Admission diagnosis:  Acute on chronic combined systolic and diastolic congestive heart failure ([place]) [  I50.43] ?Acute respiratory failure with hypoxia ([place]) [J96.01] ?Patient Active Problem List  ? Diagnosis Date Noted  ? Acute respiratory failure with hypoxia ([place]) 02/13/2022  ? Acute on chronic diastolic CHF (congestive heart failure) ([place]) 02/13/2022  ? Uncontrolled type 2 diabetes mellitus with hyperglycemia ([place]) 03/04/2018  ? Chronic diastolic heart failure ([place])  12/24/2016  ? Lymphedema 02/22/2016  ? Diabetes with neurologic complications ([place]) 09/98/3382  ? Chronic headaches 05/10/2015  ? Equivocal stress test 10/24/2014  ? Atrial fibrillation ([place]) 08/15/2014  ? Healthcare maintenance 08/15/2014  ? Type 2 diabetes mellitus with diabetic neuropathy ([place]) 08/15/2014  ? 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 Recurrent cellulitis of lower extremity 08/02/2013  ? Lymphedema of both lower extremities 11/04/2008  ? Morbid obesity due to excess calories ([place]) 12/14/2007  ? Venous (peripheral) insufficiency 07/22/2007  ? Diabetic renal disease ([place]) 04/20/2007  ? DM2 (diabetes mellitus, type 2) ([place]) 08/11/2006  ? Hypertension, essential 08/11/2006  ? ALLERGIC RHINITIS 08/11/2006  ? ASTHMA 08/11/2006  ? Asthma 08/11/2006  ? ?PCP:  Tammi Sou, MD ?Pharmacy:   ?[facility], [place] ?[address] ?[place] [address] ?Phone: [phone] Fax: [phone] ? ? ? ? ?Social Determinants of Health (SDOH) Interventions ?  ? ?Readmission Risk Interventions ? ?  02/15/2022  ?  2:32 PM  ?Readmission Risk Prevention Plan  ?Transportation Screening Complete  ?PCP or Specialist Appt within 5-7 Days Complete  ? ? ? ?

## 2022-02-15 NOTE — Progress Notes (Signed)
?PROGRESS NOTE ? ? ? ?[name]  [id number] DOB: 24-Jun-1951 DOA: 02/13/2022 ?PCP: Tammi Sou, MD  ? ?Brief Narrative:  ?71 y.o. male, with PMH of A-fib, HFpEF, hypertension, diabetes, morbid obesity, venous stasis, who presented to the ER on 02/13/2022 with weight gain and hypoxia.  Patient was found to be in CHF exacerbation started on IV Lasix.  Chest x-ray showed cardiomegaly and pulmonary vascular congestion. ? ? ?Assessment & Plan: ? Principal Problem: ?  Acute on chronic diastolic CHF (congestive heart failure) ([place]) ?Active Problems: ?  DM2 (diabetes mellitus, type 2) ([place]) ?  Diabetic renal disease ([place]) ?  Hypertension, essential ?  Lymphedema of both lower extremities ?  Atrial fibrillation ([place]) ?  Acute respiratory failure with hypoxia ([place]) ?  ?Acute hypoxic respiratory distress ?Acute diastolic congestive heart failure with preserved ejection fraction, class IV ?- On room air patient was saturating 80% not requiring supplemental oxygen.  Lasix IV twice daily ordered.  Monitor electrolytes and replete as necessary.  Echo in 2019 showed EF of 65%.  Somewhat poor response from IV Lasix alone, add Zaroxolyn ?- Echocardiogram-EF of 55-60% ?- Fluid restriction, strict input and output ? ?Chronic atrial fibrillation ?- On Cardizem and Xarelto. ? ?Urinary tract infection ?- Diagnosed outpatient and was prescribed doxycycline?Marland Kitchen  We will have to repeat UA here ? ?Diabetes mellitus type 2 ?- Continue home regimen. Sliding scale and Accu-Cheks. ? ?CKD stage IIIb ?- Creatinine baseline around 1.5, continue to monitor with diuresis.  Renal function slowly improving. ? ?Chronic bilateral venous stasis wound, present on admission ?- Wound care nurse consulted-maintain current dressing until 4/19, if he stays past it wound care team will follow-up. ?Leave the wraps that are in place on the BLE intact until 4/19, do not remove. ?  ?DVT prophylaxis: Xarelto ?Code Status: Full  ?Family Communication:    ? ?Status is: Inpatient ?Remains inpatient appropriate because: Patient is quite volume overloaded he will need aggressive IV diuretics.  I expect him to stay in the hospital for at least next 2-4 days.  Cardiology team will be consulted. ? ? ? ?Subjective: ?Patient feels slightly better but does not know the amount of urine output he has had. ? ?Examination: ? ?General exam: Appears calm and comfortable, morbid obesity.  On 3 L nasal cannula. ?Respiratory system: Diminished breath sounds bilaterally ?Cardiovascular system: S1 & S2 heard, RRR. No JVD, murmurs, rubs, gallops or clicks. No pedal edema. ?Gastrointestinal system: Abdomen is nondistended, soft and nontender. No organomegaly or masses felt. Normal bowel sounds heard. ?Central nervous system: Alert and oriented. No focal neurological deficits. ?Extremities: Symmetric 5 x 5 power. ?Skin: Bilateral lower extremity dressing in place ?Psychiatry: Judgement and insight appear normal. Mood & affect appropriate.  ? ? ? ?Objective: ?Vitals:  ? 02/14/22 1042 02/14/22 1336 02/14/22 2131 02/15/22 0446  ?BP: (!) 121/56 (!) 115/50 140/68 125/83  ?Pulse: 65 64 74 63  ?Resp:   14 14  ?Temp:  98 ?F (36.7 ?C) 98.1 ?F (36.7 ?C) 97.6 ?F (36.4 ?C)  ?TempSrc:  Oral Oral Oral  ?SpO2:  95% 98% 95%  ?Weight:    (!) 186.4 kg  ?Height:      ? ? ?Intake/Output Summary (Last 24 hours) at 02/15/2022 1242 ?Last data filed at 02/15/2022 1034 ?Gross per 24 hour  ?Intake 240 ml  ?Output --  ?Net 240 ml  ? ?Filed Weights  ? 02/13/22 1529 02/13/22 2036 02/15/22 0446  ?Weight: (!) 181.4 kg (!) 186.5 kg Marland Kitchen)  186.4 kg  ? ? ? ?Data Reviewed:  ? ?CBC: ?Recent Labs  ?Lab 02/13/22 ?1600 02/14/22 ?[id number]  ?WBC 6.8 6.5  ?NEUTROABS 4.9  --   ?HGB 10.0* 9.6*  ?HCT 32.6* 31.5*  ?MCV 74.9* 75.9*  ?PLT 235 240  ? ?Basic Metabolic Panel: ?Recent Labs  ?Lab 02/13/22 ?1600 02/14/22 ?[id number] 02/15/22 ?0604  ?NA 138 140 140  ?K 4.7 4.4 4.1  ?CL 105 105 105  ?CO2 25 28 28   ?GLUCOSE 124* 138* 136*  ?BUN 29* 29* 29*   ?CREATININE 1.80* 1.66* 1.60*  ?CALCIUM 8.6* 8.4* 8.3*  ?MG  --  1.9 1.9  ?PHOS  --  4.2  --   ? ?GFR: ?Estimated Creatinine Clearance: 71.9 mL/min (A) (by C-G formula based on SCr of 1.6 mg/dL (H)). ?Liver Function Tests: ?No results for input(s): AST, ALT, ALKPHOS, BILITOT, PROT, ALBUMIN in the last 168 hours. ?No results for input(s): LIPASE, AMYLASE in the last 168 hours. ?No results for input(s): AMMONIA in the last 168 hours. ?Coagulation Profile: ?No results for input(s): INR, PROTIME in the last 168 hours. ?Cardiac Enzymes: ?No results for input(s): CKTOTAL, CKMB, CKMBINDEX, TROPONINI in the last 168 hours. ?BNP (last 3 results) ?No results for input(s): PROBNP in the last 8760 hours. ?HbA1C: ?No results for input(s): HGBA1C in the last 72 hours. ?CBG: ?Recent Labs  ?Lab 02/14/22 ?1222 02/14/22 ?1657 02/14/22 ?2132 02/15/22 ?[id number] 02/15/22 ?1206  ?GLUCAP 111* 101* 146* 137* 121*  ? ?Lipid Profile: ?No results for input(s): CHOL, HDL, LDLCALC, TRIG, CHOLHDL, LDLDIRECT in the last 72 hours. ?Thyroid Function Tests: ?No results for input(s): TSH, T4TOTAL, FREET4, T3FREE, THYROIDAB in the last 72 hours. ?Anemia Panel: ?No results for input(s): VITAMINB12, FOLATE, FERRITIN, TIBC, IRON, RETICCTPCT in the last 72 hours. ?Sepsis Labs: ?No results for input(s): PROCALCITON, LATICACIDVEN in the last 168 hours. ? ?Recent Results (from the past 240 hour(s))  ?Resp Panel by RT-PCR (Flu A&B, Covid) Nasopharyngeal Swab     Status: None  ? Collection Time: 02/13/22  6:26 PM  ? Specimen: Nasopharyngeal Swab; Nasopharyngeal(NP) swabs in vial transport medium  ?Result Value Ref Range Status  ? SARS Coronavirus 2 by RT PCR NEGATIVE NEGATIVE Final  ?  Comment: (NOTE) ?SARS-CoV-2 target nucleic acids are NOT DETECTED. ? ?The SARS-CoV-2 RNA is generally detectable in upper respiratory ?specimens during the acute phase of infection. The lowest ?concentration of SARS-CoV-2 viral copies this assay can detect is ?138 copies/mL. A  negative result does not preclude SARS-Cov-2 ?infection and should not be used as the sole basis for treatment or ?other patient management decisions. A negative result may occur with  ?improper specimen collection/handling, submission of specimen other ?than nasopharyngeal swab, presence of viral mutation(s) within the ?areas targeted by this assay, and inadequate number of viral ?copies(<138 copies/mL). A negative result must be combined with ?clinical observations, patient history, and epidemiological ?information. The expected result is Negative. ? ?Fact Sheet for Patients:  ?EntrepreneurPulse.com.au ? ?Fact Sheet for Healthcare Providers:  ?IncredibleEmployment.be ? ?This test is no t yet approved or cleared by the [place] FDA and  ?has been authorized for detection and/or diagnosis of SARS-CoV-2 by ?FDA under an Emergency Use Authorization (EUA). This EUA will remain  ?in effect (meaning this test can be used) for the duration of the ?COVID-19 declaration under Section 564(b)(1) of the Act, 21 ?U.S.C.section 360bbb-3(b)(1), unless the authorization is terminated  ?or revoked sooner.  ? ? ?  ? Influenza A by PCR NEGATIVE NEGATIVE Final  ?  Influenza B by PCR NEGATIVE NEGATIVE Final  ?  Comment: (NOTE) ?The Xpert Xpress SARS-CoV-2/FLU/RSV plus assay is intended as an aid ?in the diagnosis of influenza from Nasopharyngeal swab specimens and ?should not be used as a sole basis for treatment. Nasal washings and ?aspirates are unacceptable for Xpert Xpress SARS-CoV-2/FLU/RSV ?testing. ? ?Fact Sheet for Patients: ?EntrepreneurPulse.com.au ? ?Fact Sheet for Healthcare Providers: ?IncredibleEmployment.be ? ?This test is not yet approved or cleared by the [place] FDA and ?has been authorized for detection and/or diagnosis of SARS-CoV-2 by ?FDA under an Emergency Use Authorization (EUA). This EUA will remain ?in effect (meaning this test can  be used) for the duration of the ?COVID-19 declaration under Section 564(b)(1) of the Act, 21 U.S.C. ?section 360bbb-3(b)(1), unless the authorization is terminated or ?revoked. ? ?Performed at [facility]

## 2022-02-15 NOTE — Progress Notes (Addendum)
? ?Progress Note ? ?Patient Name: [name] ?Date of Encounter: 02/15/2022 ? ?[facility] Cardiologist: Pixie Casino, MD  ? ?Subjective  ? ?Patient denies chest pain, palpitations. Believes his breathing has improved. Has not noticed an increase in urine production since starting lasix.  ? ?Inpatient Medications  ?  ?Scheduled Meds: ? diltiazem  120 mg Oral Daily  ? furosemide  80 mg Intravenous Q8H  ? HYDROcodone-acetaminophen  1 tablet Oral QHS  ? insulin aspart  0-15 Units Subcutaneous TID WC  ? insulin aspart  0-5 Units Subcutaneous QHS  ? insulin aspart protamine- aspart  20 Units Subcutaneous BID WC  ? isosorbide mononitrate  30 mg Oral Daily  ? loratadine  10 mg Oral Daily  ? multivitamin with minerals  1 tablet Oral Daily  ? rivaroxaban  20 mg Oral Q supper  ? terazosin  10 mg Oral QHS  ? ?Continuous Infusions: ? ?PRN Meds: ?acetaminophen **OR** acetaminophen, albuterol, guaiFENesin, hydrALAZINE, metoprolol tartrate, ondansetron **OR** ondansetron (ZOFRAN) IV, senna-docusate  ? ?Vital Signs  ?  ?Vitals:  ? 02/14/22 1042 02/14/22 1336 02/14/22 2131 02/15/22 0446  ?BP: (!) 121/56 (!) 115/50 140/68 125/83  ?Pulse: 65 64 74 63  ?Resp:   14 14  ?Temp:  98 ?F (36.7 ?C) 98.1 ?F (36.7 ?C) 97.6 ?F (36.4 ?C)  ?TempSrc:  Oral Oral Oral  ?SpO2:  95% 98% 95%  ?Weight:    (!) 186.4 kg  ?Height:      ? ? ?Intake/Output Summary (Last 24 hours) at 02/15/2022 1204 ?Last data filed at 02/15/2022 1034 ?Gross per 24 hour  ?Intake 240 ml  ?Output --  ?Net 240 ml  ? ? ?  02/15/2022  ?  4:46 AM 02/13/2022  ?  8:36 PM 02/13/2022  ?  3:29 PM  ?Last 3 Weights  ?Weight (lbs) 410 lb 15 oz 411 lb 2.5 oz 400 lb  ?Weight (kg) 186.4 kg 186.5 kg 181.439 kg  ?   ? ?Telemetry  ?  ?Atrial fibrillation, HR in the 60s-70s - Personally Reviewed ? ?ECG  ?  ?No new tracings - Personally Reviewed ? ?Physical Exam  ? ?GEN: No acute distress.  Sitting comfortably in the chair  ?Neck: No JVD ?Cardiac: RRR, no murmurs, rubs, or gallops.   ?Respiratory: Distant breath sounds. Crackles in bilateral lung bases  ?GI: Tight, distended. Not tender to palpation  ?MS: Significant edema to the midthigh with chronic appearing skin changes/thickening  ?Neuro:  Nonfocal  ?Psych: Normal affect  ? ?Labs  ?  ?High Sensitivity Troponin:   ?Recent Labs  ?Lab 02/13/22 ?1600 02/13/22 ?1800  ?TROPONINIHS 9 9  ?   ?Chemistry ?Recent Labs  ?Lab 02/13/22 ?1600 02/14/22 ?[id number] 02/15/22 ?0604  ?NA 138 140 140  ?K 4.7 4.4 4.1  ?CL 105 105 105  ?CO2 25 28 28   ?GLUCOSE 124* 138* 136*  ?BUN 29* 29* 29*  ?CREATININE 1.80* 1.66* 1.60*  ?CALCIUM 8.6* 8.4* 8.3*  ?MG  --  1.9 1.9  ?GFRNONAA 40* 44* 46*  ?ANIONGAP 8 7 7   ?  ?Lipids No results for input(s): CHOL, TRIG, HDL, LABVLDL, LDLCALC, CHOLHDL in the last 168 hours.  ?Hematology ?Recent Labs  ?Lab 02/13/22 ?1600 02/14/22 ?[id number]  ?WBC 6.8 6.5  ?RBC 4.35 4.15*  ?HGB 10.0* 9.6*  ?HCT 32.6* 31.5*  ?MCV 74.9* 75.9*  ?MCH 23.0* 23.1*  ?MCHC 30.7 30.5  ?RDW 18.8* 18.7*  ?PLT 235 240  ? ?Thyroid No results for input(s): TSH, FREET4 in the  last 168 hours.  ?BNP ?Recent Labs  ?Lab 02/13/22 ?1601  ?BNP 293.8*  ?  ?DDimer No results for input(s): DDIMER in the last 168 hours.  ? ?Radiology  ?  ?DG Chest 2 View ? ?Result Date: 02/13/2022 ?CLINICAL DATA:  shob EXAM: CHEST - 2 VIEW COMPARISON:  10/24/2015. FINDINGS: Lordotic positioning and patient body habitus limits evaluation. Enlarged cardiac silhouette. Pulmonary vascular congestion. Hazy bilateral opacities. No visible pneumothorax. No evidence of acute osseous abnormality. IMPRESSION: 1. Cardiomegaly and pulmonary vascular congestion. 2. Hazy bilateral opacities may represent mild edema. Lordotic positioning and patient body habitus limits evaluation. 3. Nodular opacity at the right lung base probably represents a dilated vessel given the above findings, but recommend follow-up radiographs to exclude nodule. Electronically Signed   By: Margaretha Sheffield M.D.   On: 02/13/2022 16:30   ? ?ECHOCARDIOGRAM COMPLETE ? ?Result Date: 02/14/2022 ?   ECHOCARDIOGRAM REPORT   Patient Name:   [name] Date of Exam: 02/14/2022 Medical Rec #:  [id number]          Height:       70.0 in Accession #:    [id number]         Weight:       411.2 lb Date of Birth:  18-Dec-1950           BSA:          2.835 m? Patient Age:    71 years           BP:           115/50 mmHg Patient Gender: M                  HR:           60 bpm. Exam Location:  Inpatient Procedure: 2D Echo and Intracardiac Opacification Agent Indications:    Cardiomegaly  History:        Patient has prior history of Echocardiogram examinations, most                 recent 12/29/2017. Arrythmias:Atrial Fibrillation; Risk                 Factors:Diabetes and Hypertension.  Sonographer:    Jefferey Pica Referring Phys: [id number] [place]  Sonographer Comments: Patient is morbidly obese. Image acquisition challenging due to patient body habitus. IMPRESSIONS  1. Left ventricular ejection fraction, by estimation, is 55 to 60%. The left ventricle has normal function. The left ventricle has no regional wall motion abnormalities. There is moderate left ventricular hypertrophy. Left ventricular diastolic function  could not be evaluated.  2. Right ventricular systolic function was not well visualized. The right ventricular size is not well visualized.  3. Left atrial size was mildly dilated.  4. The mitral valve is normal in structure. No evidence of mitral valve regurgitation.  5. The aortic valve is normal in structure. There is mild calcification of the aortic valve. Aortic valve regurgitation is trivial. FINDINGS  Left Ventricle: Left ventricular ejection fraction, by estimation, is 55 to 60%. The left ventricle has normal function. The left ventricle has no regional wall motion abnormalities. The left ventricular internal cavity size was normal in size. There is  moderate left ventricular hypertrophy. Left ventricular diastolic function could not  be evaluated due to atrial fibrillation. Left ventricular diastolic function could not be evaluated. Right Ventricle: The right ventricular size is not well visualized. Right vetricular wall thickness was not well visualized. Right ventricular systolic  function was not well visualized. Left Atrium: Left atrial size was mildly dilated. Right Atrium: Right atrial size was normal in size. Pericardium: There is no evidence of pericardial effusion. Mitral Valve: The mitral valve is normal in structure. No evidence of mitral valve regurgitation. Tricuspid Valve: The tricuspid valve is grossly normal. Tricuspid valve regurgitation is mild. Aortic Valve: The aortic valve is normal in structure. There is mild calcification of the aortic valve. Aortic valve regurgitation is trivial. Aortic valve peak gradient measures 6.3 mmHg. Pulmonic Valve: The pulmonic valve was normal in structure. Pulmonic valve regurgitation is not visualized. Aorta: The aortic root and ascending aorta are structurally normal, with no evidence of dilitation. IAS/Shunts: The interatrial septum was not well visualized.  LEFT VENTRICLE PLAX 2D LVIDd:         5.10 cm   Diastology LVIDs:         3.70 cm   LV e' medial:    7.20 cm/s LV PW:         1.30 cm   LV E/e' medial:  17.6 LV IVS:        1.40 cm   LV e' lateral:   3.53 cm/s LVOT diam:     2.00 cm   LV E/e' lateral: 36.0 LV SV:         66 LV SV Index:   23 LVOT Area:     3.14 cm?  IVC IVC diam: 3.70 cm LEFT ATRIUM              Index LA diam:        4.80 cm  1.69 cm/m? LA Vol (A2C):   108.0 ml 38.09 ml/m? LA Vol (A4C):   75.4 ml  26.58 ml/m? LA Biplane Vol: 107.0 ml 37.74 ml/m?  AORTIC VALVE                 PULMONIC VALVE AV Area (Vmax): 2.43 cm?     PV Vmax:       0.90 m/s AV Vmax:        125.50 cm/s  PV Peak grad:  3.2 mmHg AV Peak Grad:   6.3 mmHg LVOT Vmax:      97.00 cm/s LVOT Vmean:     57.300 cm/s LVOT VTI:       0.211 m  AORTA Ao Root diam: 3.20 cm Ao Asc diam:  3.60 cm MITRAL VALVE                 TRICUSPID VALVE MV Area (PHT): 4.99 cm?     TR Peak grad:   38.7 mmHg MV Decel Time: 152 msec     TR Vmax:        311.00 cm/s MV E velocity: 127.00 cm/s MV A velocity: 36.50 cm/s   SHUNTS MV E/A ratio:  3.48

## 2022-02-16 ENCOUNTER — Inpatient Hospital Stay (HOSPITAL_COMMUNITY): Payer: Medicare Other

## 2022-02-16 DIAGNOSIS — I5033 Acute on chronic diastolic (congestive) heart failure: Secondary | ICD-10-CM | POA: Diagnosis not present

## 2022-02-16 LAB — BASIC METABOLIC PANEL
Anion gap: 8 (ref 5–15)
BUN: 34 mg/dL — ABNORMAL HIGH (ref 8–23)
CO2: 30 mmol/L (ref 22–32)
Calcium: 8.6 mg/dL — ABNORMAL LOW (ref 8.9–10.3)
Chloride: 101 mmol/L (ref 98–111)
Creatinine, Ser: 1.74 mg/dL — ABNORMAL HIGH (ref 0.61–1.24)
GFR, Estimated: 42 mL/min — ABNORMAL LOW (ref >60)
Glucose, Bld: 151 mg/dL — ABNORMAL HIGH (ref 70–99)
Potassium: 3.9 mmol/L (ref 3.5–5.1)
Sodium: 139 mmol/L (ref 135–145)

## 2022-02-16 LAB — GLUCOSE, CAPILLARY
Glucose-Capillary: 148 mg/dL — ABNORMAL HIGH (ref 70–99)
Glucose-Capillary: 97 mg/dL (ref 70–99)
Glucose-Capillary: 162 mg/dL — ABNORMAL HIGH (ref 70–99)
Glucose-Capillary: 84 mg/dL (ref 70–99)

## 2022-02-16 LAB — MAGNESIUM: Magnesium: 2 mg/dL (ref 1.7–2.4)

## 2022-02-16 MED ORDER — SODIUM CHLORIDE 0.9 % IV SOLN
1.0000 g | INTRAVENOUS | Status: AC
  Administered 2022-02-16 – 2022-02-20 (×5): 1 g via INTRAVENOUS
  Filled 2022-02-16 (×5): qty 10

## 2022-02-16 NOTE — Progress Notes (Signed)
?PROGRESS NOTE ? ? ? ?[name]  BTD:[id number] DOB: 01-11-1951 DOA: 02/13/2022 ?PCP: [name], MD  ? ?Brief Narrative:  ?71 y.o. male, with PMH of A-fib, HFpEF, hypertension, diabetes, morbid obesity, venous stasis, who presented to the ER on 02/13/2022 with weight gain and hypoxia.  Patient was found to be in CHF exacerbation started on IV Lasix.  Chest x-ray showed cardiomegaly and pulmonary vascular congestion.  Also found to have UTI therefore started on antibiotic IV Rocephin. ? ? ?Assessment & Plan: ? Principal Problem: ?  Acute on chronic diastolic CHF (congestive heart failure) (HCC) ?Active Problems: ?  DM2 (diabetes mellitus, type 2) (HCC) ?  Diabetic renal disease (HCC) ?  Hypertension, essential ?  Lymphedema of both lower extremities ?  Atrial fibrillation (HCC) ?  Acute respiratory failure with hypoxia (HCC) ?  ?Acute hypoxic respiratory distress ?Acute diastolic congestive heart failure with preserved ejection fraction, class IV ?- Still hypoxic with ambulation.  Echo in 2019 showed EF of 65%, echo this admission shows EF of 55 to 60%. ?- Continue aggressive diuresis guided by cardiology team.  Continue Zaroxolyn.  If poor response we can consider Lasix drip or changing the diuretics. ?- Fluid restriction, strict input and output ? ?Left elbow pain ?- We will order x-ray ? ?Chronic atrial fibrillation ?- On Cardizem and Xarelto. ? ?Urinary tract infection ?- Start patient on IV Rocephin after obtaining urine cultures today. ? ?Diabetes mellitus type 2 ?- Continue home regimen. Sliding scale and Accu-Cheks. ? ?CKD stage IIIb ?- Baseline creatinine 1.5.  Today 1.7, closely monitor ? ?Chronic bilateral venous stasis wound, present on admission ?- Wound care nurse consulted-maintain current dressing until 4/19, if he stays past it wound care team will follow-up. ?Leave the wraps that are in place on the BLE intact until 4/19, do not remove. ?  ?DVT prophylaxis: Xarelto ?Code Status: Full   ?Family Communication:   ? ?Status is: Inpatient ?Remains inpatient appropriate because: Patient is quite volume overloaded he will need aggressive IV diuretics.  I expect him to stay in the hospital for at least next 2-4 days.  Cardiology team will be consulted. ? ? ? ?Subjective: ?Complaining of left elbow pain but has good range of motion.  Says shortness of breath is slightly better.  Mild dysuria. ?Examination: ? ?General exam: Appears calm and comfortable, morbid obesity.  On 3 L nasal cannula. ?Respiratory system: Diminished breath sounds bilaterally ?Cardiovascular system: S1 & S2 heard, RRR. No JVD, murmurs, rubs, gallops or clicks.  Bilateral lower extremity dressing in place. ?Gastrointestinal system: Abdomen is nondistended, soft and nontender. No organomegaly or masses felt. Normal bowel sounds heard. ?Central nervous system: Alert and oriented. No focal neurological deficits. ?Extremities: Limited range of motion of the left elbow due to pain ?Skin: Bilateral lower extremity dressing in place ?Psychiatry: Judgement and insight appear normal. Mood & affect appropriate.  ? ? ? ?Objective: ?Vitals:  ? 02/15/22 0446 02/15/22 1445 02/15/22 2118 02/16/22 0435  ?BP: 125/83 (!) 108/56 140/65 (!) 130/49  ?Pulse: 63 65 67 (!) 53  ?Resp: 14 16 14 14   ?Temp: 97.6 ?F (36.4 ?C) (!) 97.5 ?F (36.4 ?C) 98.1 ?F (36.7 ?C) 98 ?F (36.7 ?C)  ?TempSrc: Oral Oral Oral Oral  ?SpO2: 95% 96% 97% 94%  ?Weight: (!) 186.4 kg   (!) 186.2 kg  ?Height:      ? ? ?Intake/Output Summary (Last 24 hours) at 02/16/2022 1113 ?Last data filed at 02/16/2022 1019 ?Gross per 24 hour  ?  Intake 240 ml  ?Output 1950 ml  ?Net -1710 ml  ? ?Filed Weights  ? 02/13/22 2036 02/15/22 0446 02/16/22 0435  ?Weight: (!) 186.5 kg (!) 186.4 kg (!) 186.2 kg  ? ? ? ?Data Reviewed:  ? ?CBC: ?Recent Labs  ?Lab 02/13/22 ?1600 02/14/22 ?[id number]  ?WBC 6.8 6.5  ?NEUTROABS 4.9  --   ?HGB 10.0* 9.6*  ?HCT 32.6* 31.5*  ?MCV 74.9* 75.9*  ?PLT 235 240  ? ?Basic Metabolic  Panel: ?Recent Labs  ?Lab 02/13/22 ?1600 02/14/22 ?[id number] 02/15/22 ?0604 02/16/22 ?[id number]  ?NA 138 140 140 139  ?K 4.7 4.4 4.1 3.9  ?CL 105 105 105 101  ?CO2 25 28 28 30   ?GLUCOSE 124* 138* 136* 151*  ?BUN 29* 29* 29* 34*  ?CREATININE 1.80* 1.66* 1.60* 1.74*  ?CALCIUM 8.6* 8.4* 8.3* 8.6*  ?MG  --  1.9 1.9 2.0  ?PHOS  --  4.2  --   --   ? ?GFR: ?Estimated Creatinine Clearance: 66.1 mL/min (A) (by C-G formula based on SCr of 1.74 mg/dL (H)). ?Liver Function Tests: ?No results for input(s): AST, ALT, ALKPHOS, BILITOT, PROT, ALBUMIN in the last 168 hours. ?No results for input(s): LIPASE, AMYLASE in the last 168 hours. ?No results for input(s): AMMONIA in the last 168 hours. ?Coagulation Profile: ?No results for input(s): INR, PROTIME in the last 168 hours. ?Cardiac Enzymes: ?No results for input(s): CKTOTAL, CKMB, CKMBINDEX, TROPONINI in the last 168 hours. ?BNP (last 3 results) ?No results for input(s): PROBNP in the last 8760 hours. ?HbA1C: ?No results for input(s): HGBA1C in the last 72 hours. ?CBG: ?Recent Labs  ?Lab 02/15/22 ?0733 02/15/22 ?1206 02/15/22 ?1649 02/15/22 ?2119 02/16/22 ?0732  ?GLUCAP 137* 121* 109* 167* 148*  ? ?Lipid Profile: ?No results for input(s): CHOL, HDL, LDLCALC, TRIG, CHOLHDL, LDLDIRECT in the last 72 hours. ?Thyroid Function Tests: ?No results for input(s): TSH, T4TOTAL, FREET4, T3FREE, THYROIDAB in the last 72 hours. ?Anemia Panel: ?No results for input(s): VITAMINB12, FOLATE, FERRITIN, TIBC, IRON, RETICCTPCT in the last 72 hours. ?Sepsis Labs: ?No results for input(s): PROCALCITON, LATICACIDVEN in the last 168 hours. ? ?Recent Results (from the past 240 hour(s))  ?Resp Panel by RT-PCR (Flu A&B, Covid) Nasopharyngeal Swab     Status: None  ? Collection Time: 02/13/22  6:26 PM  ? Specimen: Nasopharyngeal Swab; Nasopharyngeal(NP) swabs in vial transport medium  ?Result Value Ref Range Status  ? SARS Coronavirus 2 by RT PCR NEGATIVE NEGATIVE Final  ?  Comment: (NOTE) ?SARS-CoV-2 target nucleic  acids are NOT DETECTED. ? ?The SARS-CoV-2 RNA is generally detectable in upper respiratory ?specimens during the acute phase of infection. The lowest ?concentration of SARS-CoV-2 viral copies this assay can detect is ?138 copies/mL. A negative result does not preclude SARS-Cov-2 ?infection and should not be used as the sole basis for treatment or ?other patient management decisions. A negative result may occur with  ?improper specimen collection/handling, submission of specimen other ?than nasopharyngeal swab, presence of viral mutation(s) within the ?areas targeted by this assay, and inadequate number of viral ?copies(<138 copies/mL). A negative result must be combined with ?clinical observations, patient history, and epidemiological ?information. The expected result is Negative. ? ?Fact Sheet for Patients:  ?EntrepreneurPulse.com.au ? ?Fact Sheet for Healthcare Providers:  ?IncredibleEmployment.be ? ?This test is no t yet approved or cleared by the [place] FDA and  ?has been authorized for detection and/or diagnosis of SARS-CoV-2 by ?FDA under an Emergency Use Authorization (EUA). This EUA will remain  ?in effect (  meaning this test can be used) for the duration of the ?COVID-19 declaration under Section 564(b)(1) of the Act, 21 ?U.S.C.section 360bbb-3(b)(1), unless the authorization is terminated  ?or revoked sooner.  ? ? ?  ? Influenza A by PCR NEGATIVE NEGATIVE Final  ? Influenza B by PCR NEGATIVE NEGATIVE Final  ?  Comment: (NOTE) ?The Xpert Xpress SARS-CoV-2/FLU/RSV plus assay is intended as an aid ?in the diagnosis of influenza from Nasopharyngeal swab specimens and ?should not be used as a sole basis for treatment. Nasal washings and ?aspirates are unacceptable for Xpert Xpress SARS-CoV-2/FLU/RSV ?testing. ? ?Fact Sheet for Patients: ?EntrepreneurPulse.com.au ? ?Fact Sheet for Healthcare Providers: ?IncredibleEmployment.be ? ?This  test is not yet approved or cleared by the [place] FDA and ?has been authorized for detection and/or diagnosis of SARS-CoV-2 by ?FDA under an Emergency Use Authorization (EUA). This EUA will remain ?in eff

## 2022-02-16 NOTE — Progress Notes (Signed)
? ?Progress Note ? ?Patient Name: [name] ?Date of Encounter: 02/16/2022 ? ?[facility] Cardiologist: Pixie Casino, MD  ? ?Subjective  ? ?No CP. Per nurse, I/O may be incomplete. ? ?Inpatient Medications  ?  ?Scheduled Meds: ? diltiazem  120 mg Oral Daily  ? furosemide  80 mg Intravenous Q8H  ? HYDROcodone-acetaminophen  1 tablet Oral QHS  ? insulin aspart  0-15 Units Subcutaneous TID WC  ? insulin aspart  0-5 Units Subcutaneous QHS  ? insulin aspart protamine- aspart  20 Units Subcutaneous BID WC  ? isosorbide mononitrate  30 mg Oral Daily  ? loratadine  10 mg Oral Daily  ? metolazone  5 mg Oral Daily  ? multivitamin with minerals  1 tablet Oral Daily  ? rivaroxaban  20 mg Oral Q supper  ? terazosin  10 mg Oral QHS  ? ?Continuous Infusions: ? ?PRN Meds: ?acetaminophen **OR** acetaminophen, albuterol, guaiFENesin, hydrALAZINE, metoprolol tartrate, ondansetron **OR** ondansetron (ZOFRAN) IV, senna-docusate  ? ?Vital Signs  ?  ?Vitals:  ? 02/15/22 0446 02/15/22 1445 02/15/22 2118 02/16/22 0435  ?BP: 125/83 (!) 108/56 140/65 (!) 130/49  ?Pulse: 63 65 67 (!) 53  ?Resp: 14 16 14 14   ?Temp: 97.6 ?F (36.4 ?C) (!) 97.5 ?F (36.4 ?C) 98.1 ?F (36.7 ?C) 98 ?F (36.7 ?C)  ?TempSrc: Oral Oral Oral Oral  ?SpO2: 95% 96% 97% 94%  ?Weight: (!) 186.4 kg   (!) 186.2 kg  ?Height:      ? ? ?Intake/Output Summary (Last 24 hours) at 02/16/2022 0526 ?Last data filed at 02/16/2022 0439 ?Gross per 24 hour  ?Intake 240 ml  ?Output 1400 ml  ?Net -1160 ml  ? ? ?  02/16/2022  ?  4:35 AM 02/15/2022  ?  4:46 AM 02/13/2022  ?  8:36 PM  ?Last 3 Weights  ?Weight (lbs) 410 lb 7.9 oz 410 lb 15 oz 411 lb 2.5 oz  ?Weight (kg) 186.2 kg 186.4 kg 186.5 kg  ?   ? ?Telemetry  ?  ?Atrial fibrillation, HR in the 60s-70s - Personally Reviewed ? ?ECG  ?  ?No new tracings - Personally Reviewed ? ?Physical Exam  ? ?GEN: No acute distress.  Sitting comfortably. ?Neck: No JVD ?Cardiac: RRR, no murmurs, rubs, or gallops.  ?Respiratory: Distant breath sounds.  Crackles in bilateral lung bases  ?GI: Tight, distended. Not tender to palpation  ?MS: Significant edema to the midthigh with chronic appearing skin changes/thickening  ?Neuro:  Nonfocal  ?Psych: Normal affect  ? ?Labs  ?  ?High Sensitivity Troponin:   ?Recent Labs  ?Lab 02/13/22 ?1600 02/13/22 ?1800  ?TROPONINIHS 9 9  ?   ?Chemistry ?Recent Labs  ?Lab 02/13/22 ?1600 02/14/22 ?[id number] 02/15/22 ?0604  ?NA 138 140 140  ?K 4.7 4.4 4.1  ?CL 105 105 105  ?CO2 25 28 28   ?GLUCOSE 124* 138* 136*  ?BUN 29* 29* 29*  ?CREATININE 1.80* 1.66* 1.60*  ?CALCIUM 8.6* 8.4* 8.3*  ?MG  --  1.9 1.9  ?GFRNONAA 40* 44* 46*  ?ANIONGAP 8 7 7   ?  ?Lipids No results for input(s): CHOL, TRIG, HDL, LABVLDL, LDLCALC, CHOLHDL in the last 168 hours.  ?Hematology ?Recent Labs  ?Lab 02/13/22 ?1600 02/14/22 ?[id number]  ?WBC 6.8 6.5  ?RBC 4.35 4.15*  ?HGB 10.0* 9.6*  ?HCT 32.6* 31.5*  ?MCV 74.9* 75.9*  ?MCH 23.0* 23.1*  ?MCHC 30.7 30.5  ?RDW 18.8* 18.7*  ?PLT 235 240  ? ?Thyroid No results for input(s): TSH, FREET4 in the  last 168 hours.  ?BNP ?Recent Labs  ?Lab 02/13/22 ?1601  ?BNP 293.8*  ?  ?DDimer No results for input(s): DDIMER in the last 168 hours.  ? ?Radiology  ?  ?ECHOCARDIOGRAM COMPLETE ? ?Result Date: 02/14/2022 ?   ECHOCARDIOGRAM REPORT   Patient Name:   SWAY HOGLUND Claxton Date of Exam: 02/14/2022 Medical Rec #:  [id number]          Height:       70.0 in Accession #:    [id number]         Weight:       411.2 lb Date of Birth:  1951/07/11           BSA:          2.835 m? Patient Age:    28 years           BP:           115/50 mmHg Patient Gender: M                  HR:           60 bpm. Exam Location:  Inpatient Procedure: 2D Echo and Intracardiac Opacification Agent Indications:    Cardiomegaly  History:        Patient has prior history of Echocardiogram examinations, most                 recent 12/29/2017. Arrythmias:Atrial Fibrillation; Risk                 Factors:Diabetes and Hypertension.  Sonographer:    Jefferey Pica Referring Phys: [id number]  Rebecca  Sonographer Comments: Patient is morbidly obese. Image acquisition challenging due to patient body habitus. IMPRESSIONS  1. Left ventricular ejection fraction, by estimation, is 55 to 60%. The left ventricle has normal function. The left ventricle has no regional wall motion abnormalities. There is moderate left ventricular hypertrophy. Left ventricular diastolic function  could not be evaluated.  2. Right ventricular systolic function was not well visualized. The right ventricular size is not well visualized.  3. Left atrial size was mildly dilated.  4. The mitral valve is normal in structure. No evidence of mitral valve regurgitation.  5. The aortic valve is normal in structure. There is mild calcification of the aortic valve. Aortic valve regurgitation is trivial. FINDINGS  Left Ventricle: Left ventricular ejection fraction, by estimation, is 55 to 60%. The left ventricle has normal function. The left ventricle has no regional wall motion abnormalities. The left ventricular internal cavity size was normal in size. There is  moderate left ventricular hypertrophy. Left ventricular diastolic function could not be evaluated due to atrial fibrillation. Left ventricular diastolic function could not be evaluated. Right Ventricle: The right ventricular size is not well visualized. Right vetricular wall thickness was not well visualized. Right ventricular systolic function was not well visualized. Left Atrium: Left atrial size was mildly dilated. Right Atrium: Right atrial size was normal in size. Pericardium: There is no evidence of pericardial effusion. Mitral Valve: The mitral valve is normal in structure. No evidence of mitral valve regurgitation. Tricuspid Valve: The tricuspid valve is grossly normal. Tricuspid valve regurgitation is mild. Aortic Valve: The aortic valve is normal in structure. There is mild calcification of the aortic valve. Aortic valve regurgitation is trivial. Aortic valve peak  gradient measures 6.3 mmHg. Pulmonic Valve: The pulmonic valve was normal in structure. Pulmonic valve regurgitation is not visualized. Aorta: The aortic root and ascending aorta are  structurally normal, with no evidence of dilitation. IAS/Shunts: The interatrial septum was not well visualized.  LEFT VENTRICLE PLAX 2D LVIDd:         5.10 cm   Diastology LVIDs:         3.70 cm   LV e' medial:    7.20 cm/s LV PW:         1.30 cm   LV E/e' medial:  17.6 LV IVS:        1.40 cm   LV e' lateral:   3.53 cm/s LVOT diam:     2.00 cm   LV E/e' lateral: 36.0 LV SV:         66 LV SV Index:   23 LVOT Area:     3.14 cm?  IVC IVC diam: 3.70 cm LEFT ATRIUM              Index LA diam:        4.80 cm  1.69 cm/m? LA Vol (A2C):   108.0 ml 38.09 ml/m? LA Vol (A4C):   75.4 ml  26.58 ml/m? LA Biplane Vol: 107.0 ml 37.74 ml/m?  AORTIC VALVE                 PULMONIC VALVE AV Area (Vmax): 2.43 cm?     PV Vmax:       0.90 m/s AV Vmax:        125.50 cm/s  PV Peak grad:  3.2 mmHg AV Peak Grad:   6.3 mmHg LVOT Vmax:      97.00 cm/s LVOT Vmean:     57.300 cm/s LVOT VTI:       0.211 m  AORTA Ao Root diam: 3.20 cm Ao Asc diam:  3.60 cm MITRAL VALVE                TRICUSPID VALVE MV Area (PHT): 4.99 cm?     TR Peak grad:   38.7 mmHg MV Decel Time: 152 msec     TR Vmax:        311.00 cm/s MV E velocity: 127.00 cm/s MV A velocity: 36.50 cm/s   SHUNTS MV E/A ratio:  3.48         Systemic VTI:  0.21 m                             Systemic Diam: 2.00 cm Mertie Moores MD Electronically signed by Mertie Moores MD Signature Date/Time: 02/14/2022/5:04:52 PM    Final    ? ?Cardiac Studies  ? ?Echocardiogram 02/14/22  ? 1. Left ventricular ejection fraction, by estimation, is 55 to 60%. The  ?left ventricle has normal function. The left ventricle has no regional  ?wall motion abnormalities. There is moderate left ventricular hypertrophy.  ?Left ventricular diastolic function  ? could not be evaluated.  ? 2. Right ventricular systolic function was not well  visualized. The right  ?ventricular size is not well visualized.  ? 3. Left atrial size was mildly dilated.  ? 4. The mitral valve is normal in structure. No evidence of mitral valve  ?regurgitation.  ? 5. T

## 2022-02-17 ENCOUNTER — Inpatient Hospital Stay: Payer: Self-pay

## 2022-02-17 ENCOUNTER — Telehealth: Payer: Self-pay

## 2022-02-17 ENCOUNTER — Inpatient Hospital Stay (HOSPITAL_COMMUNITY): Payer: Medicare Other

## 2022-02-17 DIAGNOSIS — I5033 Acute on chronic diastolic (congestive) heart failure: Secondary | ICD-10-CM | POA: Diagnosis not present

## 2022-02-17 LAB — BASIC METABOLIC PANEL
Anion gap: 11 (ref 5–15)
BUN: 41 mg/dL — ABNORMAL HIGH (ref 8–23)
CO2: 29 mmol/L (ref 22–32)
Calcium: 8.7 mg/dL — ABNORMAL LOW (ref 8.9–10.3)
Chloride: 99 mmol/L (ref 98–111)
Creatinine, Ser: 1.77 mg/dL — ABNORMAL HIGH (ref 0.61–1.24)
GFR, Estimated: 41 mL/min — ABNORMAL LOW (ref >60)
Glucose, Bld: 139 mg/dL — ABNORMAL HIGH (ref 70–99)
Potassium: 3.6 mmol/L (ref 3.5–5.1)
Sodium: 139 mmol/L (ref 135–145)

## 2022-02-17 LAB — MAGNESIUM: Magnesium: 1.9 mg/dL (ref 1.7–2.4)

## 2022-02-17 LAB — GLUCOSE, CAPILLARY
Glucose-Capillary: 135 mg/dL — ABNORMAL HIGH (ref 70–99)
Glucose-Capillary: 115 mg/dL — ABNORMAL HIGH (ref 70–99)
Glucose-Capillary: 147 mg/dL — ABNORMAL HIGH (ref 70–99)
Glucose-Capillary: 161 mg/dL — ABNORMAL HIGH (ref 70–99)

## 2022-02-17 LAB — URINE CULTURE: Culture: 40000 — AB

## 2022-02-17 MED ORDER — FUROSEMIDE 10 MG/ML IJ SOLN
40.0000 mg | Freq: Once | INTRAMUSCULAR | Status: AC
  Administered 2022-02-17: 40 mg via INTRAVENOUS
  Filled 2022-02-17: qty 4

## 2022-02-17 MED ORDER — SODIUM CHLORIDE 0.9% FLUSH
10.0000 mL | Freq: Two times a day (BID) | INTRAVENOUS | Status: DC
  Administered 2022-02-17 – 2022-02-25 (×12): 10 mL

## 2022-02-17 MED ORDER — SODIUM CHLORIDE 0.9% FLUSH: 10.0000 mL | INTRAVENOUS | Status: DC | PRN

## 2022-02-17 MED ORDER — HYDROCODONE-ACETAMINOPHEN 5-325 MG PO TABS
1.0000 | ORAL_TABLET | Freq: Three times a day (TID) | ORAL | Status: DC | PRN
Start: 2022-02-17 — End: 2022-03-07
  Administered 2022-02-17 – 2022-03-06 (×37): 1 via ORAL
  Filled 2022-02-17 (×37): qty 1

## 2022-02-17 MED ORDER — CHLORHEXIDINE GLUCONATE CLOTH 2 % EX PADS
6.0000 | MEDICATED_PAD | Freq: Every day | CUTANEOUS | Status: DC
  Administered 2022-02-18 – 2022-03-06 (×17): 6 via TOPICAL

## 2022-02-17 MED ORDER — FUROSEMIDE 10 MG/ML IJ SOLN
120.0000 mg | Freq: Two times a day (BID) | INTRAVENOUS | Status: DC
  Administered 2022-02-17 – 2022-02-18 (×2): 120 mg via INTRAVENOUS
  Filled 2022-02-17 (×2): qty 12
  Filled 2022-02-17: qty 10

## 2022-02-17 NOTE — Telephone Encounter (Signed)
Sie  was "kicked out" of current motel room and had to go to another one, Ended up at [facility] [address]  His dr has recommended he be hospitalized due to excess fluid accumulation.  Juliann Pulse, RN ?

## 2022-02-17 NOTE — Progress Notes (Signed)
Peripherally Inserted Central Catheter Placement ? ?The IV Nurse has discussed with the patient and/or persons authorized to consent for the patient, the purpose of this procedure and the potential benefits and risks involved with this procedure.  The benefits include less needle sticks, lab draws from the catheter, and the patient may be discharged home with the catheter. Risks include, but not limited to, infection, bleeding, blood clot (thrombus formation), and puncture of an artery; nerve damage and irregular heartbeat and possibility to perform a PICC exchange if needed/ordered by physician.  Alternatives to this procedure were also discussed.  Bard Power PICC patient education guide, fact sheet on infection prevention and patient information card has been provided to patient /or left at bedside.   ? ?PICC Placement Documentation  ?PICC Double Lumen [id number] Right Basilic 46 cm 0 cm (Active)  ?Indication for Insertion or Continuance of Line Chronic illness with exacerbations (CF, Sickle Cell, etc.);Vasoactive infusions 02/17/22 1745  ?Exposed Catheter (cm) 0 cm 02/17/22 1745  ?Site Assessment Clean, Dry, Intact 02/17/22 1745  ?Lumen #1 Status Flushed;Saline locked;Blood return noted 02/17/22 1745  ?Lumen #2 Status Flushed;Saline locked;Blood return noted 02/17/22 1745  ?Dressing Type Securing device;Transparent 02/17/22 1745  ?Dressing Status Antimicrobial disc in place;Clean, Dry, Intact 02/17/22 1745  ?Safety Lock Not Applicable [id number]  ?Line Care Connections checked and tightened 02/17/22 1745  ?Line Adjustment (NICU/IV Team Only) No 02/17/22 1745  ?Dressing Intervention New dressing 02/17/22 1745  ?Dressing Change Due 02/24/22 02/17/22 1745  ? ? ? ? ? ?[name] ?02/17/2022, 5:46 PM ? ?

## 2022-02-17 NOTE — Progress Notes (Signed)
? ?Progress Note ? ?Patient Name: [name] ?Date of Encounter: 02/17/2022 ? ?[facility] Cardiologist: Pixie Casino, MD  ? ?Subjective  ? ?No CP. Per nurse, I/O incomplete due to patient factors.  ? ?Inpatient Medications  ?  ?Scheduled Meds: ? diltiazem  120 mg Oral Daily  ? furosemide  40 mg Intravenous Once  ? HYDROcodone-acetaminophen  1 tablet Oral QHS  ? insulin aspart  0-15 Units Subcutaneous TID WC  ? insulin aspart  0-5 Units Subcutaneous QHS  ? insulin aspart protamine- aspart  20 Units Subcutaneous BID WC  ? isosorbide mononitrate  30 mg Oral Daily  ? loratadine  10 mg Oral Daily  ? metolazone  5 mg Oral Daily  ? multivitamin with minerals  1 tablet Oral Daily  ? rivaroxaban  20 mg Oral Q supper  ? terazosin  10 mg Oral QHS  ? ?Continuous Infusions: ? cefTRIAXone (ROCEPHIN)  IV 1 g (02/17/22 1119)  ? furosemide    ? ?PRN Meds: ?acetaminophen **OR** acetaminophen, albuterol, guaiFENesin, hydrALAZINE, metoprolol tartrate, ondansetron **OR** ondansetron (ZOFRAN) IV, senna-docusate  ? ?Vital Signs  ?  ?Vitals:  ? 02/16/22 0435 02/16/22 1438 02/16/22 2105 02/17/22 0500  ?BP: (!) 130/49 (!) 124/58 (!) 127/55 (!) 116/51  ?Pulse: (!) 53 68 72 65  ?Resp: 14 18 18    ?Temp: 98 ?F (36.7 ?C) 98.1 ?F (36.7 ?C) 97.9 ?F (36.6 ?C) 98.3 ?F (36.8 ?C)  ?TempSrc: Oral Oral Oral Oral  ?SpO2: 94% 95% 92% 94%  ?Weight: (!) 186.2 kg     ?Height:      ? ? ?Intake/Output Summary (Last 24 hours) at 02/17/2022 1204 ?Last data filed at 02/17/2022 0855 ?Gross per 24 hour  ?Intake 1195 ml  ?Output 1000 ml  ?Net 195 ml  ? ? ?  02/16/2022  ?  4:35 AM 02/15/2022  ?  4:46 AM 02/13/2022  ?  8:36 PM  ?Last 3 Weights  ?Weight (lbs) 410 lb 7.9 oz 410 lb 15 oz 411 lb 2.5 oz  ?Weight (kg) 186.2 kg 186.4 kg 186.5 kg  ?   ? ?Telemetry  ?  ?Atrial fibrillation, HR in the 60s-70s - Personally Reviewed ? ?ECG  ?  ?No new tracings - Personally Reviewed ? ?Physical Exam  ? ?GEN: No acute distress.  Sitting comfortably. ?Neck: No JVD ?Cardiac:  RRR, no murmurs, rubs, or gallops.  ?Respiratory: Distant breath sounds. Crackles in bilateral lung bases  ?GI: Tight, distended. Not tender to palpation  ?MS: Significant edema to the midthigh with chronic appearing skin changes/thickening  ?Neuro:  Nonfocal  ?Psych: Normal affect  ? ?Labs  ?  ?High Sensitivity Troponin:   ?Recent Labs  ?Lab 02/13/22 ?1600 02/13/22 ?1800  ?TROPONINIHS 9 9  ?   ?Chemistry ?Recent Labs  ?Lab 02/15/22 ?0604 02/16/22 ?[id number] 02/17/22 ?0630  ?NA 140 139 139  ?K 4.1 3.9 3.6  ?CL 105 101 99  ?CO2 28 30 29   ?GLUCOSE 136* 151* 139*  ?BUN 29* 34* 41*  ?CREATININE 1.60* 1.74* 1.77*  ?CALCIUM 8.3* 8.6* 8.7*  ?MG 1.9 2.0 1.9  ?GFRNONAA 46* 42* 41*  ?ANIONGAP 7 8 11   ?  ?Lipids No results for input(s): CHOL, TRIG, HDL, LABVLDL, LDLCALC, CHOLHDL in the last 168 hours.  ?Hematology ?Recent Labs  ?Lab 02/13/22 ?1600 02/14/22 ?[id number]  ?WBC 6.8 6.5  ?RBC 4.35 4.15*  ?HGB 10.0* 9.6*  ?HCT 32.6* 31.5*  ?MCV 74.9* 75.9*  ?MCH 23.0* 23.1*  ?MCHC 30.7 30.5  ?RDW 18.8* 18.7*  ?  PLT 235 240  ? ?Thyroid No results for input(s): TSH, FREET4 in the last 168 hours.  ?BNP ?Recent Labs  ?Lab 02/13/22 ?1601  ?BNP 293.8*  ?  ?DDimer No results for input(s): DDIMER in the last 168 hours.  ? ?Radiology  ?  ?DG Elbow 2 Views Left ? ?Result Date: 02/16/2022 ?CLINICAL DATA:  Left posterior elbow pain EXAM: LEFT ELBOW - 2 VIEW COMPARISON:  None. FINDINGS: There is no evidence of fracture, dislocation, or joint effusion. Soft tissues are unremarkable. IMPRESSION: No acute fracture or dislocation. Electronically Signed   By: Sherian Rein M.D.   On: 02/16/2022 12:27  ? ?[place] EKG SITE RITE ? ?Result Date: 02/17/2022 ?If [employer] not attached, placement could not be confirmed due to current cardiac rhythm.  ? ?Cardiac Studies  ? ?Echocardiogram 02/14/22  ? 1. Left ventricular ejection fraction, by estimation, is 55 to 60%. The  ?left ventricle has normal function. The left ventricle has no regional  ?wall motion abnormalities.  There is moderate left ventricular hypertrophy.  ?Left ventricular diastolic function  ? could not be evaluated.  ? 2. Right ventricular systolic function was not well visualized. The right  ?ventricular size is not well visualized.  ? 3. Left atrial size was mildly dilated.  ? 4. The mitral valve is normal in structure. No evidence of mitral valve  ?regurgitation.  ? 5. The aortic valve is normal in structure. There is mild calcification  ?of the aortic valve. Aortic valve regurgitation is trivial.  ? ?Patient Profile  ?   ?71 y.o. male with a hx of chronic atrial fibrillatin, chronic combined CHF, HTN, DM type 2, CKD stage 3a, RLS, OSA who is being seen 02/14/2022 for the evaluation of CHF at the request of Dr. Nelson Chimes. ? ?Assessment & Plan  ? ?Acute hypoxic respiratory failure  ?Acute on Chronic HFpEF  ?- Patient complaining of lower extremity edema, SOB, weight gain, and he is requiring supplemental oxygen (not on oxygen at home)  ?- CXR showed cardiomegaly, pulmonary vascular congestion, hazy bilateral opacities that may represent mild edema.  ?- BNP elevated to 293.8 ?- Creatinine was elevated slightly to 1.8 on admission, improved to 1.60 after IV lasix. Consider possible cardiorenal syndrome and continue to closely follow renal function during diuresis.  ?- Output not recorded (patient has been going to the bathroom by himself) ?- Patient reports that he has not noticed an increase in his urine output on IV lasix, I asked him to record his urine output so we could measure.  ?- Believes that his dry weight is around 375, possibly 360s. Weight 411.16 lbs on admission  ?- Cr slowly rising - challenging to assess success with diuresis.  ?- unclear degree of diuresis, no reliable data. D/w Dr. Nelson Chimes, will arrange for PICC placement and CVP and coox for better guidance for diuresis.  ?- increase lasix to 120 mg IV BID ?- metolazone 5 mg daily  ?- Right heart on echo appears moderately reduced and moderately enlarged on  echo, likely a component of RHF, consider torsemide when he is fully diuresed for homegoing regimen. ?- Strict I/Os, daily weights  ?- Continue imdur 30 mg daily  ?- On valsartan at home, held due to elevated creatinine  ?  ?Chronic Atrial Fibrillation  ?- Rate is well controlled per telemetry  ?- Continue diltizem 120 mg daily (may need adjustment if patient has low EF on echocardiogram)  ?- Continue xarelto 20 mg daily  ?  ?CKD  Stage IIIb ?- Baseline creatinine around 1.5 ?- Creatinine elevated to 1.8 on admission ? ?  ?Type 2 DM ?- Managed per primary  ?   ? ?For questions or updates, please contact [place] ?Please consult [email or web address] for contact info under  ? ?  ?   ?Signed, ?Elouise Munroe, MD  ?02/17/2022, 12:04 PM   ? ? ?

## 2022-02-17 NOTE — Progress Notes (Signed)
?PROGRESS NOTE ? ? ? ?[name]  [id number] DOB: 11/25/1950 DOA: 02/13/2022 ?PCP: Tammi Sou, MD  ? ?Brief Narrative:  ?71 y.o. male, with PMH of A-fib, HFpEF, hypertension, diabetes, morbid obesity, venous stasis, who presented to the ER on 02/13/2022 with weight gain and hypoxia.  Patient was found to be in CHF exacerbation started on IV Lasix.  Chest x-ray showed cardiomegaly and pulmonary vascular congestion.  Also found to have UTI therefore started on antibiotic IV Rocephin. ? ? ?Assessment & Plan: ? Principal Problem: ?  Acute on chronic diastolic CHF (congestive heart failure) ([place]) ?Active Problems: ?  DM2 (diabetes mellitus, type 2) ([place]) ?  Diabetic renal disease ([place]) ?  Hypertension, essential ?  Lymphedema of both lower extremities ?  Atrial fibrillation ([place]) ?  Acute respiratory failure with hypoxia ([place]) ?  ?Acute hypoxic respiratory distress ?Acute diastolic congestive heart failure with preserved ejection fraction, class IV ?- Still hypoxic with ambulation.  Echo in 2019 showed EF of 65%, echo this admission shows EF of 55 to 60%. ?- Increase Lasix to 120 mg twice daily.  Continue Zaroxolyn.  We will place PICC line for COOX ?-Cardiology following ?- Fluid restriction, strict input and output ? ?Left elbow pain ?- We will order x-ray ? ?Chronic atrial fibrillation ?- On Cardizem and Xarelto. ? ?Urinary tract infection ?- Start patient on IV Rocephin after obtaining urine cultures today. ? ?Diabetes mellitus type 2 ?- Continue home regimen. Sliding scale and Accu-Cheks. ? ?CKD stage IIIb ?- Baseline creatinine 1.5.  Creatinine 1.77 ? ?Chronic bilateral venous stasis wound, present on admission ?- Wound care nurse consulted-maintain current dressing until 4/19, if he stays past it wound care team will follow-up. ?Leave the wraps that are in place on the BLE intact until 4/19, do not remove. ?  ?DVT prophylaxis: Xarelto ?Code Status: Full  ?Family Communication:   ? ?Status is:  Inpatient ?Remains inpatient appropriate because: Patient is quite volume overloaded he will need aggressive IV diuretics.  I expect him to stay in the hospital for at least next 2-4 days.  Cardiology team will be consulted. ? ? ? ?Subjective: ?Feels okay no complaints.  Tells me he has been urinating enough with Lasix but do not see documentation of that.  Some improvement in shortness of breath.  He understands volume status is hard to assess in his case given his morbid obesity. ?His left upper extremity is feeling better ?Examination: ? ?Constitutional: Not in acute distress, 3 L nasal cannula ?Respiratory: Bilateral diminished breath sounds ?Cardiovascular: Normal sinus rhythm, no rubs ?Abdomen: Nontender nondistended good bowel sounds ?Musculoskeletal: No edema noted ?Skin: Bilateral lower extremity Ace wrap in place ?Neurologic: CN 2-12 grossly intact.  And nonfocal ?Psychiatric: Normal judgment and insight. Alert and oriented x 3. Normal mood. ?Objective: ?Vitals:  ? 02/16/22 0435 02/16/22 1438 02/16/22 2105 02/17/22 0500  ?BP: (!) 130/49 (!) 124/58 (!) 127/55 (!) 116/51  ?Pulse: (!) 53 68 72 65  ?Resp: 14 18 18    ?Temp: 98 ?F (36.7 ?C) 98.1 ?F (36.7 ?C) 97.9 ?F (36.6 ?C) 98.3 ?F (36.8 ?C)  ?TempSrc: Oral Oral Oral Oral  ?SpO2: 94% 95% 92% 94%  ?Weight: (!) 186.2 kg     ?Height:      ? ? ?Intake/Output Summary (Last 24 hours) at 02/17/2022 1140 ?Last data filed at 02/17/2022 0855 ?Gross per 24 hour  ?Intake 1195 ml  ?Output 1000 ml  ?Net 195 ml  ? ?Filed Weights  ? 02/13/22 2036  02/15/22 0446 02/16/22 0435  ?Weight: (!) 186.5 kg (!) 186.4 kg (!) 186.2 kg  ? ? ? ?Data Reviewed:  ? ?CBC: ?Recent Labs  ?Lab 02/13/22 ?1600 02/14/22 ?[id number]  ?WBC 6.8 6.5  ?NEUTROABS 4.9  --   ?HGB 10.0* 9.6*  ?HCT 32.6* 31.5*  ?MCV 74.9* 75.9*  ?PLT 235 240  ? ?Basic Metabolic Panel: ?Recent Labs  ?Lab 02/13/22 ?1600 02/14/22 ?[id number] 02/15/22 ?0604 02/16/22 ?[id number] 02/17/22 ?0630  ?NA 138 140 140 139 139  ?K 4.7 4.4 4.1 3.9 3.6  ?CL 105  105 105 101 99  ?CO2 25 28 28 30 29   ?GLUCOSE 124* 138* 136* 151* 139*  ?BUN 29* 29* 29* 34* 41*  ?CREATININE 1.80* 1.66* 1.60* 1.74* 1.77*  ?CALCIUM 8.6* 8.4* 8.3* 8.6* 8.7*  ?MG  --  1.9 1.9 2.0 1.9  ?PHOS  --  4.2  --   --   --   ? ?GFR: ?Estimated Creatinine Clearance: 65 mL/min (A) (by C-G formula based on SCr of 1.77 mg/dL (H)). ?Liver Function Tests: ?No results for input(s): AST, ALT, ALKPHOS, BILITOT, PROT, ALBUMIN in the last 168 hours. ?No results for input(s): LIPASE, AMYLASE in the last 168 hours. ?No results for input(s): AMMONIA in the last 168 hours. ?Coagulation Profile: ?No results for input(s): INR, PROTIME in the last 168 hours. ?Cardiac Enzymes: ?No results for input(s): CKTOTAL, CKMB, CKMBINDEX, TROPONINI in the last 168 hours. ?BNP (last 3 results) ?No results for input(s): PROBNP in the last 8760 hours. ?HbA1C: ?No results for input(s): HGBA1C in the last 72 hours. ?CBG: ?Recent Labs  ?Lab 02/16/22 ?1210 02/16/22 ?1621 02/16/22 ?2102 02/17/22 ?[id number] 02/17/22 ?1121  ?GLUCAP 97 162* 84 135* 115*  ? ?Lipid Profile: ?No results for input(s): CHOL, HDL, LDLCALC, TRIG, CHOLHDL, LDLDIRECT in the last 72 hours. ?Thyroid Function Tests: ?No results for input(s): TSH, T4TOTAL, FREET4, T3FREE, THYROIDAB in the last 72 hours. ?Anemia Panel: ?No results for input(s): VITAMINB12, FOLATE, FERRITIN, TIBC, IRON, RETICCTPCT in the last 72 hours. ?Sepsis Labs: ?No results for input(s): PROCALCITON, LATICACIDVEN in the last 168 hours. ? ?Recent Results (from the past 240 hour(s))  ?Resp Panel by RT-PCR (Flu A&B, Covid) Nasopharyngeal Swab     Status: None  ? Collection Time: 02/13/22  6:26 PM  ? Specimen: Nasopharyngeal Swab; Nasopharyngeal(NP) swabs in vial transport medium  ?Result Value Ref Range Status  ? SARS Coronavirus 2 by RT PCR NEGATIVE NEGATIVE Final  ?  Comment: (NOTE) ?SARS-CoV-2 target nucleic acids are NOT DETECTED. ? ?The SARS-CoV-2 RNA is generally detectable in upper respiratory ?specimens  during the acute phase of infection. The lowest ?concentration of SARS-CoV-2 viral copies this assay can detect is ?138 copies/mL. A negative result does not preclude SARS-Cov-2 ?infection and should not be used as the sole basis for treatment or ?other patient management decisions. A negative result may occur with  ?improper specimen collection/handling, submission of specimen other ?than nasopharyngeal swab, presence of viral mutation(s) within the ?areas targeted by this assay, and inadequate number of viral ?copies(<138 copies/mL). A negative result must be combined with ?clinical observations, patient history, and epidemiological ?information. The expected result is Negative. ? ?Fact Sheet for Patients:  ?EntrepreneurPulse.com.au ? ?Fact Sheet for Healthcare Providers:  ?IncredibleEmployment.be ? ?This test is no t yet approved or cleared by the [place] FDA and  ?has been authorized for detection and/or diagnosis of SARS-CoV-2 by ?FDA under an Emergency Use Authorization (EUA). This EUA will remain  ?in effect (meaning this test can be  used) for the duration of the ?COVID-19 declaration under Section 564(b)(1) of the Act, 21 ?U.S.C.section 360bbb-3(b)(1), unless the authorization is terminated  ?or revoked sooner.  ? ? ?  ? Influenza A by PCR NEGATIVE NEGATIVE Final  ? Influenza B by PCR NEGATIVE NEGATIVE Final  ?  Comment: (NOTE) ?The Xpert Xpress SARS-CoV-2/FLU/RSV plus assay is intended as an aid ?in the diagnosis of influenza from Nasopharyngeal swab specimens and ?should not be used as a sole basis for treatment. Nasal washings and ?aspirates are unacceptable for Xpert Xpress SARS-CoV-2/FLU/RSV ?testing. ? ?Fact Sheet for Patients: ?EntrepreneurPulse.com.au ? ?Fact Sheet for Healthcare Providers: ?IncredibleEmployment.be ? ?This test is not yet approved or cleared by the [place] FDA and ?has been authorized for detection  and/or diagnosis of SARS-CoV-2 by ?FDA under an Emergency Use Authorization (EUA). This EUA will remain ?in effect (meaning this test can be used) for the duration of the ?COVID-19 declaration under Section 564(b)(1

## 2022-02-18 DIAGNOSIS — I482 Chronic atrial fibrillation, unspecified: Secondary | ICD-10-CM | POA: Diagnosis not present

## 2022-02-18 DIAGNOSIS — I5033 Acute on chronic diastolic (congestive) heart failure: Secondary | ICD-10-CM | POA: Diagnosis not present

## 2022-02-18 DIAGNOSIS — I890 Lymphedema, not elsewhere classified: Secondary | ICD-10-CM | POA: Diagnosis not present

## 2022-02-18 DIAGNOSIS — I10 Essential (primary) hypertension: Secondary | ICD-10-CM | POA: Diagnosis not present

## 2022-02-18 LAB — BASIC METABOLIC PANEL
Anion gap: 10 (ref 5–15)
BUN: 51 mg/dL — ABNORMAL HIGH (ref 8–23)
CO2: 31 mmol/L (ref 22–32)
Calcium: 8.6 mg/dL — ABNORMAL LOW (ref 8.9–10.3)
Chloride: 95 mmol/L — ABNORMAL LOW (ref 98–111)
Creatinine, Ser: 2.17 mg/dL — ABNORMAL HIGH (ref 0.61–1.24)
GFR, Estimated: 32 mL/min — ABNORMAL LOW (ref >60)
Glucose, Bld: 121 mg/dL — ABNORMAL HIGH (ref 70–99)
Potassium: 3.6 mmol/L (ref 3.5–5.1)
Sodium: 136 mmol/L (ref 135–145)

## 2022-02-18 LAB — GLUCOSE, CAPILLARY
Glucose-Capillary: 135 mg/dL — ABNORMAL HIGH (ref 70–99)
Glucose-Capillary: 148 mg/dL — ABNORMAL HIGH (ref 70–99)
Glucose-Capillary: 138 mg/dL — ABNORMAL HIGH (ref 70–99)
Glucose-Capillary: 145 mg/dL — ABNORMAL HIGH (ref 70–99)

## 2022-02-18 LAB — COOXEMETRY PANEL
Carboxyhemoglobin: 1.5 % (ref 0.5–1.5)
Methemoglobin: <0.7 % (ref 0.0–1.5)
O2 Saturation: 62.9 %
Total hemoglobin: 9.9 g/dL — ABNORMAL LOW (ref 12.0–16.0)

## 2022-02-18 LAB — MAGNESIUM: Magnesium: 2.2 mg/dL (ref 1.7–2.4)

## 2022-02-18 MED ORDER — HEPARIN (PORCINE) 25000 UT/250ML-% IV SOLN
1700.0000 [IU]/h | INTRAVENOUS | Status: DC
  Filled 2022-02-18: qty 250

## 2022-02-18 MED ORDER — SODIUM CHLORIDE 0.9% FLUSH: 3.0000 mL | INTRAVENOUS | Status: DC | PRN

## 2022-02-18 MED ORDER — SODIUM CHLORIDE 0.9 % IV SOLN: 250.0000 mL | INTRAVENOUS | Status: DC | PRN

## 2022-02-18 MED ORDER — HEPARIN BOLUS VIA INFUSION
4000.0000 [IU] | Freq: Once | INTRAVENOUS | Status: AC
  Administered 2022-02-18: 4000 [IU] via INTRAVENOUS
  Filled 2022-02-18: qty 4000

## 2022-02-18 MED ORDER — HEPARIN (PORCINE) 25000 UT/250ML-% IV SOLN
1900.0000 [IU]/h | INTRAVENOUS | Status: DC
  Administered 2022-02-18: 1700 [IU]/h via INTRAVENOUS
  Administered 2022-02-19: 1900 [IU]/h via INTRAVENOUS
  Filled 2022-02-18 (×3): qty 250

## 2022-02-18 MED ORDER — ASPIRIN 81 MG PO CHEW
81.0000 mg | CHEWABLE_TABLET | ORAL | Status: AC
  Administered 2022-02-19: 81 mg via ORAL
  Filled 2022-02-18: qty 1

## 2022-02-18 MED ORDER — SODIUM CHLORIDE 0.9 % IV SOLN: INTRAVENOUS | Status: DC

## 2022-02-18 MED ORDER — SODIUM CHLORIDE 0.9% FLUSH
3.0000 mL | Freq: Two times a day (BID) | INTRAVENOUS | Status: DC
  Administered 2022-02-18: 3 mL via INTRAVENOUS

## 2022-02-18 NOTE — Progress Notes (Signed)
PT Cancellation Note ? ?Patient Details ?Name: [name] ?MRN: [id number] ?DOB: Dec 26, 1950 ? ? ?Cancelled Treatment:     pt plans to transfer to [facility] today and scheduled to receive Heart Cath ? ?Felecia Shelling  PTA ?Acute  Rehabilitation Services ?Pager      [phone] ?Office      [phone] ? ?

## 2022-02-18 NOTE — Discharge Instructions (Signed)
Information on my medicine - XARELTO® (Rivaroxaban) ° °Why was Xarelto® prescribed for you? °Xarelto® was prescribed for you to reduce the risk of a blood clot forming that can cause a stroke if you have a medical condition called atrial fibrillation (a type of irregular heartbeat). ° °What do you need to know about xarelto® ? °Take your Xarelto® ONCE DAILY at the same time every day with your evening meal. °If you have difficulty swallowing the tablet whole, you may crush it and mix in applesauce just prior to taking your dose. ° °Take Xarelto® exactly as prescribed by your doctor and DO NOT stop taking Xarelto® without talking to the doctor who prescribed the medication.  Stopping without other stroke prevention medication to take the place of Xarelto® may increase your risk of developing a clot that causes a stroke.  Refill your prescription before you run out. ° °After discharge, you should have regular check-up appointments with your healthcare provider that is prescribing your Xarelto®.  In the future your dose may need to be changed if your kidney function or weight changes by a significant amount. ° °What do you do if you miss a dose? °If you are taking Xarelto® ONCE DAILY and you miss a dose, take it as soon as you remember on the same day then continue your regularly scheduled once daily regimen the next day. Do not take two doses of Xarelto® at the same time or on the same day.  ° °Important Safety Information °A possible side effect of Xarelto® is bleeding. You should call your healthcare provider right away if you experience any of the following: °? Bleeding from an injury or your nose that does not stop. °? Unusual colored urine (red or dark brown) or unusual colored stools (red or black). °? Unusual bruising for unknown reasons. °? A serious fall or if you hit your head (even if there is no bleeding). ° °Some medicines may interact with Xarelto® and might increase your risk of bleeding while on  Xarelto®. To help avoid this, consult your healthcare provider or pharmacist prior to using any new prescription or non-prescription medications, including herbals, vitamins, non-steroidal anti-inflammatory drugs (NSAIDs) and supplements. ° °This website has more information on Xarelto®: www.xarelto.com. ° °

## 2022-02-18 NOTE — Progress Notes (Addendum)
?PROGRESS NOTE ? ? ? ?[name]  [id number] DOB: 02-Jun-1951 DOA: 02/13/2022 ?PCP: Tammi Sou, MD  ? ?Brief Narrative:  ?71 y.o. male, with PMH of A-fib, HFpEF, hypertension, diabetes, morbid obesity, venous stasis, who presented to the ER on 02/13/2022 with weight gain and hypoxia.  Patient was found to be in CHF exacerbation started on IV Lasix.  Chest x-ray showed cardiomegaly and pulmonary vascular congestion.  Also found to have UTI therefore started on antibiotic IV Rocephin.  It was very difficult to assess his volume status therefore PICC line placed for coags. ? ? ?Assessment & Plan: ? Principal Problem: ?  Acute on chronic diastolic CHF (congestive heart failure) ([place]) ?Active Problems: ?  DM2 (diabetes mellitus, type 2) ([place]) ?  Diabetic renal disease ([place]) ?  Hypertension, essential ?  Lymphedema of both lower extremities ?  Atrial fibrillation ([place]) ?  Acute respiratory failure with hypoxia ([place]) ?  ?Acute hypoxic respiratory distress ?Acute diastolic congestive heart failure with preserved ejection fraction, class IV ?- Still hypoxic with ambulation.  Echo in 2019 showed EF of 65%, echo this admission shows EF of 55 to 60%. ?- Continue Lasix 120 mg twice daily, Zaroxolyn.  PICC line placed 4/16 for cortrak's.  For CVP monitoring he will be transferred to stepdown.  Have advised nursing staff to hold off on giving him diuretics for now due to rising creatinine. ?-Cardiology following ?- Fluid restriction, strict input and output ? ?Addendum  ?Cardiology rec transfer to [facility] for [place] tomorrow.  Per cardiology, patient should stay at [facility] for acute heart failure team consultation ? ?Left elbow pain ?- X-rays are negative.  Advised to elevate, ice ? ?Chronic atrial fibrillation ?- On Cardizem and Xarelto. ? ?Urinary tract infection ?- Continue IV Rocephin, completed 5-day course ? ?Diabetes mellitus type 2 ?- Continue home regimen. Sliding scale and Accu-Cheks. ? ?CKD stage  IIIb ?- Baseline creatinine 1.5.  Creatinine today has trended up to 2.17. ? ?Chronic bilateral venous stasis wound, present on admission ?- Wound care nurse consulted-maintain current dressing until 4/19, if he stays past it wound care team will follow-up. ?Leave the wraps that are in place on the BLE intact until 4/19, do not remove. ?  ?DVT prophylaxis: Xarelto ?Code Status: Full  ?Family Communication:   ? ?Status is: Inpatient ?Remains inpatient appropriate because: Patient is quite volume overloaded he will need aggressive IV diuretics.  I expect him to stay in the hospital for at least next 2-4 days.  Cardiology team will be consulted. ? ? ? ?Subjective: ?Feels okay, sitting up in the recliner.  Tells me he might have urinated little bit more over last 24 hours then previously.  Denies any shortness of breath at rest.  His left upper extremity is also feeling better. ?Examination: ? ?Constitutional: Not in acute distress, morbidly obese. ?Respiratory: Bilateral diminished breath sounds ?Cardiovascular: Normal sinus rhythm, no rubs ?Abdomen: Nontender nondistended good bowel sounds ?Musculoskeletal: No edema noted ?Skin: Lower extremity compression dressings are in place.  Chronic skin changes noted. ?Neurologic: CN 2-12 grossly intact.  And nonfocal ?Psychiatric: Normal judgment and insight. Alert and oriented x 3. Normal mood. ?Objective: ?Vitals:  ? 02/17/22 0500 02/17/22 1512 02/17/22 2048 02/18/22 0526  ?BP: (!) 116/51 (!) 117/52 (!) 114/46 (!) 107/52  ?Pulse: 65 75 77 (!) 56  ?Resp:  20 14 14   ?Temp: 98.3 ?F (36.8 ?C) 99 ?F (37.2 ?C) 98.5 ?F (36.9 ?C) 98 ?F (36.7 ?C)  ?TempSrc: Oral  Oral Oral Oral  ?SpO2: 94% 96% 93% 94%  ?Weight:    (!) 186.1 kg  ?Height:      ? ? ?Intake/Output Summary (Last 24 hours) at 02/18/2022 1127 ?Last data filed at 02/18/2022 0933 ?Gross per 24 hour  ?Intake 1214.01 ml  ?Output 1200 ml  ?Net 14.01 ml  ? ?Filed Weights  ? 02/15/22 0446 02/16/22 0435 02/18/22 0526  ?Weight: (!)  186.4 kg (!) 186.2 kg (!) 186.1 kg  ? ? ? ?Data Reviewed:  ? ?CBC: ?Recent Labs  ?Lab 02/13/22 ?1600 02/14/22 ?[id number]  ?WBC 6.8 6.5  ?NEUTROABS 4.9  --   ?HGB 10.0* 9.6*  ?HCT 32.6* 31.5*  ?MCV 74.9* 75.9*  ?PLT 235 240  ? ?Basic Metabolic Panel: ?Recent Labs  ?Lab 02/14/22 ?[id number] 02/15/22 ?0604 02/16/22 ?[id number] 02/17/22 ?0630 02/18/22 ?0601  ?NA 140 140 139 139 136  ?K 4.4 4.1 3.9 3.6 3.6  ?CL 105 105 101 99 95*  ?CO2 28 28 30 29 31   ?GLUCOSE 138* 136* 151* 139* 121*  ?BUN 29* 29* 34* 41* 51*  ?CREATININE 1.66* 1.60* 1.74* 1.77* 2.17*  ?CALCIUM 8.4* 8.3* 8.6* 8.7* 8.6*  ?MG 1.9 1.9 2.0 1.9 2.2  ?PHOS 4.2  --   --   --   --   ? ?GFR: ?Estimated Creatinine Clearance: 53 mL/min (A) (by C-G formula based on SCr of 2.17 mg/dL (H)). ?Liver Function Tests: ?No results for input(s): AST, ALT, ALKPHOS, BILITOT, PROT, ALBUMIN in the last 168 hours. ?No results for input(s): LIPASE, AMYLASE in the last 168 hours. ?No results for input(s): AMMONIA in the last 168 hours. ?Coagulation Profile: ?No results for input(s): INR, PROTIME in the last 168 hours. ?Cardiac Enzymes: ?No results for input(s): CKTOTAL, CKMB, CKMBINDEX, TROPONINI in the last 168 hours. ?BNP (last 3 results) ?No results for input(s): PROBNP in the last 8760 hours. ?HbA1C: ?No results for input(s): HGBA1C in the last 72 hours. ?CBG: ?Recent Labs  ?Lab 02/17/22 ?0712 02/17/22 ?1121 02/17/22 ?1746 02/17/22 ?2048 02/18/22 ?0751  ?GLUCAP 135* 115* 147* 161* 135*  ? ?Lipid Profile: ?No results for input(s): CHOL, HDL, LDLCALC, TRIG, CHOLHDL, LDLDIRECT in the last 72 hours. ?Thyroid Function Tests: ?No results for input(s): TSH, T4TOTAL, FREET4, T3FREE, THYROIDAB in the last 72 hours. ?Anemia Panel: ?No results for input(s): VITAMINB12, FOLATE, FERRITIN, TIBC, IRON, RETICCTPCT in the last 72 hours. ?Sepsis Labs: ?No results for input(s): PROCALCITON, LATICACIDVEN in the last 168 hours. ? ?Recent Results (from the past 240 hour(s))  ?Resp Panel by RT-PCR (Flu A&B, Covid)  Nasopharyngeal Swab     Status: None  ? Collection Time: 02/13/22  6:26 PM  ? Specimen: Nasopharyngeal Swab; Nasopharyngeal(NP) swabs in vial transport medium  ?Result Value Ref Range Status  ? SARS Coronavirus 2 by RT PCR NEGATIVE NEGATIVE Final  ?  Comment: (NOTE) ?SARS-CoV-2 target nucleic acids are NOT DETECTED. ? ?The SARS-CoV-2 RNA is generally detectable in upper respiratory ?specimens during the acute phase of infection. The lowest ?concentration of SARS-CoV-2 viral copies this assay can detect is ?138 copies/mL. A negative result does not preclude SARS-Cov-2 ?infection and should not be used as the sole basis for treatment or ?other patient management decisions. A negative result may occur with  ?improper specimen collection/handling, submission of specimen other ?than nasopharyngeal swab, presence of viral mutation(s) within the ?areas targeted by this assay, and inadequate number of viral ?copies(<138 copies/mL). A negative result must be combined with ?clinical observations, patient history, and epidemiological ?information. The expected result is  Negative. ? ?Fact Sheet for Patients:  ?EntrepreneurPulse.com.au ? ?Fact Sheet for Healthcare Providers:  ?IncredibleEmployment.be ? ?This test is no t yet approved or cleared by the [place] FDA and  ?has been authorized for detection and/or diagnosis of SARS-CoV-2 by ?FDA under an Emergency Use Authorization (EUA). This EUA will remain  ?in effect (meaning this test can be used) for the duration of the ?COVID-19 declaration under Section 564(b)(1) of the Act, 21 ?U.S.C.section 360bbb-3(b)(1), unless the authorization is terminated  ?or revoked sooner.  ? ? ?  ? Influenza A by PCR NEGATIVE NEGATIVE Final  ? Influenza B by PCR NEGATIVE NEGATIVE Final  ?  Comment: (NOTE) ?The Xpert Xpress SARS-CoV-2/FLU/RSV plus assay is intended as an aid ?in the diagnosis of influenza from Nasopharyngeal swab specimens and ?should not be  used as a sole basis for treatment. Nasal washings and ?aspirates are unacceptable for Xpert Xpress SARS-CoV-2/FLU/RSV ?testing. ? ?Fact Sheet for Patients: ?EntrepreneurPulse.com.au ? ?Fact

## 2022-02-18 NOTE — Progress Notes (Signed)
Dr. Mayford Knife ok with keeping patient on 6E for now since he will be transferring to [facility] for heart cath.  ?

## 2022-02-18 NOTE — H&P (View-Only) (Signed)
? ?Progress Note ? ?Patient Name: [name] ?Date of Encounter: 02/18/2022 ? ?[facility] Cardiologist: Pixie Casino, MD  ? ?Subjective  ? ?Pt sitting in chair with O2. He states his abdomen is less tight. Urination has not changed. ? ?Inpatient Medications  ?  ?Scheduled Meds: ? Chlorhexidine Gluconate Cloth  6 each Topical Daily  ? diltiazem  120 mg Oral Daily  ? insulin aspart  0-15 Units Subcutaneous TID WC  ? insulin aspart  0-5 Units Subcutaneous QHS  ? insulin aspart protamine- aspart  20 Units Subcutaneous BID WC  ? isosorbide mononitrate  30 mg Oral Daily  ? loratadine  10 mg Oral Daily  ? metolazone  5 mg Oral Daily  ? multivitamin with minerals  1 tablet Oral Daily  ? rivaroxaban  20 mg Oral Q supper  ? sodium chloride flush  10-40 mL Intracatheter Q12H  ? terazosin  10 mg Oral QHS  ? ?Continuous Infusions: ? cefTRIAXone (ROCEPHIN)  IV Stopped (02/17/22 1149)  ? furosemide Stopped (02/17/22 1917)  ? ?PRN Meds: ?acetaminophen **OR** acetaminophen, albuterol, guaiFENesin, hydrALAZINE, HYDROcodone-acetaminophen, metoprolol tartrate, ondansetron **OR** ondansetron (ZOFRAN) IV, senna-docusate, sodium chloride flush  ? ?Vital Signs  ?  ?Vitals:  ? 02/17/22 0500 02/17/22 1512 02/17/22 2048 02/18/22 0526  ?BP: (!) 116/51 (!) 117/52 (!) 114/46 (!) 107/52  ?Pulse: 65 75 77 (!) 56  ?Resp:  20 14 14   ?Temp: 98.3 ?F (36.8 ?C) 99 ?F (37.2 ?C) 98.5 ?F (36.9 ?C) 98 ?F (36.7 ?C)  ?TempSrc: Oral Oral Oral Oral  ?SpO2: 94% 96% 93% 94%  ?Weight:    (!) 186.1 kg  ?Height:      ? ? ?Intake/Output Summary (Last 24 hours) at 02/18/2022 0850 ?Last data filed at 02/18/2022 [id number] ?Gross per 24 hour  ?Intake 1214.01 ml  ?Output 1200 ml  ?Net 14.01 ml  ? ? ?  02/18/2022  ?  5:26 AM 02/16/2022  ?  4:35 AM 02/15/2022  ?  4:46 AM  ?Last 3 Weights  ?Weight (lbs) 410 lb 4.4 oz 410 lb 7.9 oz 410 lb 15 oz  ?Weight (kg) 186.1 kg 186.2 kg 186.4 kg  ?   ? ?Telemetry  ?  ?Atrial fibrillation with ventricular rates 60s - Personally  Reviewed ? ?ECG  ?  ?No new tracings - Personally Reviewed ? ?Physical Exam  ? ?GEN: morbidly obese male in NAD   ?Neck: No JVD ?Cardiac: irregular rhythm, regular rate ?Respiratory: anterior exam unremarkable ?GI: Soft, nontender, non-distended  ?MS: 4+ pitting edema - B legs wrapped ?Neuro:  Nonfocal  ?Psych: Normal affect  ? ?Labs  ?  ?High Sensitivity Troponin:   ?Recent Labs  ?Lab 02/13/22 ?1600 02/13/22 ?1800  ?TROPONINIHS 9 9  ?   ?Chemistry ?Recent Labs  ?Lab 02/15/22 ?0604 02/16/22 ?[id number] 02/17/22 ?0630  ?NA 140 139 139  ?K 4.1 3.9 3.6  ?CL 105 101 99  ?CO2 28 30 29   ?GLUCOSE 136* 151* 139*  ?BUN 29* 34* 41*  ?CREATININE 1.60* 1.74* 1.77*  ?CALCIUM 8.3* 8.6* 8.7*  ?MG 1.9 2.0 1.9  ?GFRNONAA 46* 42* 41*  ?ANIONGAP 7 8 11   ?  ?Lipids No results for input(s): CHOL, TRIG, HDL, LABVLDL, LDLCALC, CHOLHDL in the last 168 hours.  ?Hematology ?Recent Labs  ?Lab 02/13/22 ?1600 02/14/22 ?[id number]  ?WBC 6.8 6.5  ?RBC 4.35 4.15*  ?HGB 10.0* 9.6*  ?HCT 32.6* 31.5*  ?MCV 74.9* 75.9*  ?MCH 23.0* 23.1*  ?MCHC 30.7 30.5  ?RDW 18.8* 18.7*  ?  PLT 235 240  ? ?Thyroid No results for input(s): TSH, FREET4 in the last 168 hours.  ?BNP ?Recent Labs  ?Lab 02/13/22 ?1601  ?BNP 293.8*  ?  ?DDimer No results for input(s): DDIMER in the last 168 hours.  ? ?Radiology  ?  ?DG Elbow 2 Views Left ? ?Result Date: 02/16/2022 ?CLINICAL DATA:  Left posterior elbow pain EXAM: LEFT ELBOW - 2 VIEW COMPARISON:  None. FINDINGS: There is no evidence of fracture, dislocation, or joint effusion. Soft tissues are unremarkable. IMPRESSION: No acute fracture or dislocation. Electronically Signed   By: Wei-Chen  Lin M.D.   On: 02/16/2022 12:27  ? ?DG CHEST PORT 1 VIEW ? ?Result Date: 02/17/2022 ?CLINICAL DATA:  PICC placement. EXAM: PORTABLE CHEST 1 VIEW COMPARISON:  Chest x-ray dated February 13, 2022. FINDINGS: New right upper extremity PICC line with tip in the region of the proximal SVC. Unchanged cardiomegaly and pulmonary vascular congestion. No focal  consolidation, pleural effusion, or pneumothorax. No acute osseous abnormality. IMPRESSION: 1. New right upper extremity PICC line with tip in the proximal SVC. 2. Unchanged cardiomegaly and pulmonary vascular congestion. Electronically Signed   By: William T Derry M.D.   On: 02/17/2022 18:09  ? ?US EKG SITE RITE ? ?Result Date: 02/17/2022 ?If Site Rite image not attached, placement could not be confirmed due to current cardiac rhythm.  ? ?Cardiac Studies  ? ?Echocardiogram 02/14/22  ? 1. Left ventricular ejection fraction, by estimation, is 55 to 60%. The  ?left ventricle has normal function. The left ventricle has no regional  ?wall motion abnormalities. There is moderate left ventricular hypertrophy.  ?Left ventricular diastolic function  ? could not be evaluated.  ? 2. Right ventricular systolic function was not well visualized. The right  ?ventricular size is not well visualized.  ? 3. Left atrial size was mildly dilated.  ? 4. The mitral valve is normal in structure. No evidence of mitral valve  ?regurgitation.  ? 5. The aortic valve is normal in structure. There is mild calcification  ?of the aortic valve. Aortic valve regurgitation is trivial.  ? ?Patient Profile  ?   ?71 y.o. male with a hx of chronic atrial fibrillatin, chronic combined CHF, HTN, DM type 2, CKD stage 3a, RLS, OSA who is being seen 02/14/2022 for the evaluation of CHF at the request of Dr. Amin. ? ?Assessment & Plan  ?  ?Acute hypoxic respiratory failure ?Acute on chronic diastolic heart failure ?Right heart failure ?- echo  this admission with hyperdynamic EF ?- CXR with pulmonary vascular congestion, hazy bilateral opacities that may represent mild edema ?- BNP 294, but in the setting of morbid obesity ?- output not complete - pt was going to the bathroom without measuring ?- PICC placed yesterday to monitor COOX and CVP ?- right heart moderately enlarged and fx moderately reduced ?- COOX 64, CVP not yet available ?- response to diuresis has  been challenging, lasix increased to 120 mg IV BID - was on 80 mg IV BID, but creatinine bumped - would hold off on additional lasix today while we wait for CVP (transferring to ICU for CVP monitoring) ?- metolazone 5 mg daily - consider holding this ?- weight appears unchanged ?- will await CVP, but unclear if we will be able to completely understand his volume status without a RHC  ?- continue imdur, ARB on hold for renal function ? ?PTA: amlodipine, cardizem 120 mg, 80 mg lasix BID  ? ? ?AoCKD 3 ?- sCr elevated on admission,   improved with IV diuresis ?- sCr today 2.17 (1.77) ? ? ?Chronic Afib ?- rate controlled ?- has not missed doses of xarelto ? ? ?Disposition ?Unclear - living in a motel PTA ?   ? ?For questions or updates, please contact [place] ?Please consult [email or web address] for contact info under  ? ?  ?   ?Signed, ?Ledora Bottcher, PA  ?02/18/2022, 8:50 AM   ? ?

## 2022-02-18 NOTE — Progress Notes (Signed)
? ?Progress Note ? ?Patient Name: [name] ?Date of Encounter: 02/18/2022 ? ?[facility] Cardiologist: Pixie Casino, MD  ? ?Subjective  ? ?Pt sitting in chair with O2. He states his abdomen is less tight. Urination has not changed. ? ?Inpatient Medications  ?  ?Scheduled Meds: ? Chlorhexidine Gluconate Cloth  6 each Topical Daily  ? diltiazem  120 mg Oral Daily  ? insulin aspart  0-15 Units Subcutaneous TID WC  ? insulin aspart  0-5 Units Subcutaneous QHS  ? insulin aspart protamine- aspart  20 Units Subcutaneous BID WC  ? isosorbide mononitrate  30 mg Oral Daily  ? loratadine  10 mg Oral Daily  ? metolazone  5 mg Oral Daily  ? multivitamin with minerals  1 tablet Oral Daily  ? rivaroxaban  20 mg Oral Q supper  ? sodium chloride flush  10-40 mL Intracatheter Q12H  ? terazosin  10 mg Oral QHS  ? ?Continuous Infusions: ? cefTRIAXone (ROCEPHIN)  IV Stopped (02/17/22 1149)  ? furosemide Stopped (02/17/22 1917)  ? ?PRN Meds: ?acetaminophen **OR** acetaminophen, albuterol, guaiFENesin, hydrALAZINE, HYDROcodone-acetaminophen, metoprolol tartrate, ondansetron **OR** ondansetron (ZOFRAN) IV, senna-docusate, sodium chloride flush  ? ?Vital Signs  ?  ?Vitals:  ? 02/17/22 0500 02/17/22 1512 02/17/22 2048 02/18/22 0526  ?BP: (!) 116/51 (!) 117/52 (!) 114/46 (!) 107/52  ?Pulse: 65 75 77 (!) 56  ?Resp:  20 14 14   ?Temp: 98.3 ?F (36.8 ?C) 99 ?F (37.2 ?C) 98.5 ?F (36.9 ?C) 98 ?F (36.7 ?C)  ?TempSrc: Oral Oral Oral Oral  ?SpO2: 94% 96% 93% 94%  ?Weight:    (!) 186.1 kg  ?Height:      ? ? ?Intake/Output Summary (Last 24 hours) at 02/18/2022 0850 ?Last data filed at 02/18/2022 [id number] ?Gross per 24 hour  ?Intake 1214.01 ml  ?Output 1200 ml  ?Net 14.01 ml  ? ? ?  02/18/2022  ?  5:26 AM 02/16/2022  ?  4:35 AM 02/15/2022  ?  4:46 AM  ?Last 3 Weights  ?Weight (lbs) 410 lb 4.4 oz 410 lb 7.9 oz 410 lb 15 oz  ?Weight (kg) 186.1 kg 186.2 kg 186.4 kg  ?   ? ?Telemetry  ?  ?Atrial fibrillation with ventricular rates 60s - Personally  Reviewed ? ?ECG  ?  ?No new tracings - Personally Reviewed ? ?Physical Exam  ? ?GEN: morbidly obese male in NAD   ?Neck: No JVD ?Cardiac: irregular rhythm, regular rate ?Respiratory: anterior exam unremarkable ?GI: Soft, nontender, non-distended  ?MS: 4+ pitting edema - B legs wrapped ?Neuro:  Nonfocal  ?Psych: Normal affect  ? ?Labs  ?  ?High Sensitivity Troponin:   ?Recent Labs  ?Lab 02/13/22 ?1600 02/13/22 ?1800  ?TROPONINIHS 9 9  ?   ?Chemistry ?Recent Labs  ?Lab 02/15/22 ?0604 02/16/22 ?[id number] 02/17/22 ?0630  ?NA 140 139 139  ?K 4.1 3.9 3.6  ?CL 105 101 99  ?CO2 28 30 29   ?GLUCOSE 136* 151* 139*  ?BUN 29* 34* 41*  ?CREATININE 1.60* 1.74* 1.77*  ?CALCIUM 8.3* 8.6* 8.7*  ?MG 1.9 2.0 1.9  ?GFRNONAA 46* 42* 41*  ?ANIONGAP 7 8 11   ?  ?Lipids No results for input(s): CHOL, TRIG, HDL, LABVLDL, LDLCALC, CHOLHDL in the last 168 hours.  ?Hematology ?Recent Labs  ?Lab 02/13/22 ?1600 02/14/22 ?[id number]  ?WBC 6.8 6.5  ?RBC 4.35 4.15*  ?HGB 10.0* 9.6*  ?HCT 32.6* 31.5*  ?MCV 74.9* 75.9*  ?MCH 23.0* 23.1*  ?MCHC 30.7 30.5  ?RDW 18.8* 18.7*  ?  PLT 235 240  ? ?Thyroid No results for input(s): TSH, FREET4 in the last 168 hours.  ?BNP ?Recent Labs  ?Lab 02/13/22 ?1601  ?BNP 293.8*  ?  ?DDimer No results for input(s): DDIMER in the last 168 hours.  ? ?Radiology  ?  ?DG Elbow 2 Views Left ? ?Result Date: 02/16/2022 ?CLINICAL DATA:  Left posterior elbow pain EXAM: LEFT ELBOW - 2 VIEW COMPARISON:  None. FINDINGS: There is no evidence of fracture, dislocation, or joint effusion. Soft tissues are unremarkable. IMPRESSION: No acute fracture or dislocation. Electronically Signed   By: Abelardo Diesel M.D.   On: 02/16/2022 12:27  ? ?DG CHEST PORT 1 VIEW ? ?Result Date: 02/17/2022 ?CLINICAL DATA:  PICC placement. EXAM: PORTABLE CHEST 1 VIEW COMPARISON:  Chest x-ray dated February 13, 2022. FINDINGS: New right upper extremity PICC line with tip in the region of the proximal SVC. Unchanged cardiomegaly and pulmonary vascular congestion. No focal  consolidation, pleural effusion, or pneumothorax. No acute osseous abnormality. IMPRESSION: 1. New right upper extremity PICC line with tip in the proximal SVC. 2. Unchanged cardiomegaly and pulmonary vascular congestion. Electronically Signed   By: Titus Dubin M.D.   On: 02/17/2022 18:09  ? ?[place] EKG SITE RITE ? ?Result Date: 02/17/2022 ?If [employer] not attached, placement could not be confirmed due to current cardiac rhythm.  ? ?Cardiac Studies  ? ?Echocardiogram 02/14/22  ? 1. Left ventricular ejection fraction, by estimation, is 55 to 60%. The  ?left ventricle has normal function. The left ventricle has no regional  ?wall motion abnormalities. There is moderate left ventricular hypertrophy.  ?Left ventricular diastolic function  ? could not be evaluated.  ? 2. Right ventricular systolic function was not well visualized. The right  ?ventricular size is not well visualized.  ? 3. Left atrial size was mildly dilated.  ? 4. The mitral valve is normal in structure. No evidence of mitral valve  ?regurgitation.  ? 5. The aortic valve is normal in structure. There is mild calcification  ?of the aortic valve. Aortic valve regurgitation is trivial.  ? ?Patient Profile  ?   ?71 y.o. male with a hx of chronic atrial fibrillatin, chronic combined CHF, HTN, DM type 2, CKD stage 3a, RLS, OSA who is being seen 02/14/2022 for the evaluation of CHF at the request of Dr. Reesa Chew. ? ?Assessment & Plan  ?  ?Acute hypoxic respiratory failure ?Acute on chronic diastolic heart failure ?Right heart failure ?- echo  this admission with hyperdynamic EF ?- CXR with pulmonary vascular congestion, hazy bilateral opacities that may represent mild edema ?- BNP 294, but in the setting of morbid obesity ?- output not complete - pt was going to the bathroom without measuring ?- PICC placed yesterday to monitor COOX and CVP ?- right heart moderately enlarged and fx moderately reduced ?- COOX 64, CVP not yet available ?- response to diuresis has  been challenging, lasix increased to 120 mg IV BID - was on 80 mg IV BID, but creatinine bumped - would hold off on additional lasix today while we wait for CVP (transferring to ICU for CVP monitoring) ?- metolazone 5 mg daily - consider holding this ?- weight appears unchanged ?- will await CVP, but unclear if we will be able to completely understand his volume status without a RHC  ?- continue imdur, ARB on hold for renal function ? ?PTA: amlodipine, cardizem 120 mg, 80 mg lasix BID  ? ? ?AoCKD 3 ?- sCr elevated on admission,  improved with IV diuresis ?- sCr today 2.17 (1.77) ? ? ?Chronic Afib ?- rate controlled ?- has not missed doses of xarelto ? ? ?Disposition ?Unclear - living in a motel PTA ?   ? ?For questions or updates, please contact [place] ?Please consult [email or web address] for contact info under  ? ?  ?   ?Signed, ?Ledora Bottcher, PA  ?02/18/2022, 8:50 AM   ? ?

## 2022-02-18 NOTE — Progress Notes (Signed)
ANTICOAGULATION CONSULT NOTE - Initial Consult ? ?Pharmacy Consult for Heparin ?Indication: atrial fibrillation ? ?Allergies  ?Allergen Reactions  ? Other Other (See Comments)  ?  Sneezing, coughing  ? Hydralazine Other (See Comments)  ?  Drowsiness/sedation/mental fog  ? ? ?Patient Measurements: ?Height: 5\' 10"  (177.8 cm) ?Weight: (!) 186.1 kg (410 lb 4.4 oz) ?IBW/kg (Calculated) : 73 ?Heparin Dosing Weight:  119.5 kg  ? ?Vital Signs: ?Temp: 98 ?F (36.7 ?C) (04/17 0526) ?Temp Source: Oral (04/17 0526) ?BP: 107/52 (04/17 0526) ?Pulse Rate: 56 (04/17 0526) ? ?Labs: ?Recent Labs  ?  02/16/22 ?0744 02/17/22 ?0630 02/18/22 ?0601  ?CREATININE 1.74* 1.77* 2.17*  ? ? ?Estimated Creatinine Clearance: 53 mL/min (A) (by C-G formula based on SCr of 2.17 mg/dL (H)). ? ? ?Medical History: ?Past Medical History:  ?Diagnosis Date  ? ALLERGIC RHINITIS 08/11/2006  ? ASTHMA 08/11/2006  ? Bradycardia   ? Beta blocker d/c'd 2022  ? Chronic atrial fibrillation ([place]) 08/2014  ? Chronic combined systolic and diastolic CHF (congestive heart failure) ([place])   ? Cardiology f/u 12/2017: pt volume overloaded (R heart dysf suspected), BNP very high, lasix increased.  Repeat echo 12/2017: normal LV EF, mild DD, +RV syst dysfxn, mod pulm HTN, biatrial enlgmt.  ? Chronic constipation   ? Chronic renal insufficiency, stage 3 (moderate) (HCC)   ? Colon cancer screening 02/2021  ? 02/2021 Cologuard POS->GI ref  ? DIABETES MELLITUS, TYPE II 08/11/2006  ? HYPERTENSION 08/11/2006  ? Impaired mobility and endurance   ? MRSA infection 06/2018  ? LL venous stasis ulcer infected  ? Normocytic anemia 2016-2019  ? 03/2018 B12 normal, iron ok (ferritin borderline low).  ? OBESITY, MORBID 12/14/2007  ? saxenda started 05/2020 by [facility] wt mgmt center  ? OSA (obstructive sleep apnea)   ? to get sleep study with [facility]) as of 12/03/2018 consult.  ? 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 06/2017 Clindamycin suppression (hx of MRSA) caused  diarrhea.  [id number] ID started him on amoxil prophylaxis---ineffective.  End 2018/Jan 2019 penicillin G injections prophyl helpful but pt declined to continue this as of 01/2018 ID f/u.  ID talked him into resuming monthly penicillin G as of 02/2018 f/u.    ? Restless leg syndrome   ? Rx'd clonazepam 09/2017 and pt refused to take it after reading the medication's potential side effects.  ? Venous stasis ulcers of both lower extremities ([place])   ? Severe lymphedema.  wound clinic care ongoing as of 01/2018  ? ? ? ?Assessment: ?Active Problem(s): LE edema,Dyspnea, weight gain > HF exacerbation ? ?PMH: afib, HFpEF, DM, morbid obesity, CKD3, LE lymphedema  ? ?AC/Heme: hx afib on Xarelto 20 mg PTA (LD 4/16-1800)>IV heparin for R heart cath. ?- Hgb 9.6, Plts 240 ? ?Goal of Therapy:  ?aPTT 66-102 ?Heparin level 0.3-0.7 units/ml ?Monitor platelets by anticoagulation protocol: Yes ?  ?Plan:  ?At 1800 start IV heparin 4000 unit bolus ?Heparin infusion at 1700 units/hr ?Check heparin level and aPTT 6 hrs after heparin starts. ?Daily HL, aPTT and CBC ? ? ?Christon Parada S. Alford Highland, PharmD, BCPS ?Clinical Staff Pharmacist ?[email or web address] ?Alford Highland, [employer] ?02/18/2022,1:10 PM ? ? ?

## 2022-02-19 ENCOUNTER — Encounter (HOSPITAL_COMMUNITY): Payer: Self-pay | Admitting: Internal Medicine

## 2022-02-19 ENCOUNTER — Inpatient Hospital Stay (HOSPITAL_COMMUNITY)
Admission: EM | Disposition: A | Discharge status: Skilled Nursing Facility | Payer: Self-pay | Source: Home / Self Care | Attending: Internal Medicine

## 2022-02-19 DIAGNOSIS — I5033 Acute on chronic diastolic (congestive) heart failure: Secondary | ICD-10-CM | POA: Diagnosis not present

## 2022-02-19 HISTORY — PX: RIGHT HEART CATH: CATH118263

## 2022-02-19 LAB — POCT I-STAT EG7
Acid-Base Excess: 6 mmol/L — ABNORMAL HIGH (ref 0.0–2.0)
Bicarbonate: 31.8 mmol/L — ABNORMAL HIGH (ref 20.0–28.0)
Calcium, Ion: 1.12 mmol/L — ABNORMAL LOW (ref 1.15–1.40)
HCT: 32 % — ABNORMAL LOW (ref 39.0–52.0)
Hemoglobin: 10.9 g/dL — ABNORMAL LOW (ref 13.0–17.0)
O2 Saturation: 65 %
Potassium: 3.5 mmol/L (ref 3.5–5.1)
Sodium: 137 mmol/L (ref 135–145)
TCO2: 33 mmol/L — ABNORMAL HIGH (ref 22–32)
pCO2, Ven: 52.7 mmHg (ref 44–60)
pH, Ven: 7.389 (ref 7.25–7.43)
pO2, Ven: 35 mmHg (ref 32–45)

## 2022-02-19 LAB — CBC
HCT: 29.7 % — ABNORMAL LOW (ref 39.0–52.0)
Hemoglobin: 9.2 g/dL — ABNORMAL LOW (ref 13.0–17.0)
MCH: 22.7 pg — ABNORMAL LOW (ref 26.0–34.0)
MCHC: 31 g/dL (ref 30.0–36.0)
MCV: 73.2 fL — ABNORMAL LOW (ref 80.0–100.0)
Platelets: 206 10*3/uL (ref 150–400)
RBC: 4.06 MIL/uL — ABNORMAL LOW (ref 4.22–5.81)
RDW: 18.4 % — ABNORMAL HIGH (ref 11.5–15.5)
WBC: 7.3 10*3/uL (ref 4.0–10.5)
nRBC: 0 % (ref 0.0–0.2)

## 2022-02-19 LAB — COOXEMETRY PANEL
Carboxyhemoglobin: 2.1 % — ABNORMAL HIGH (ref 0.5–1.5)
Methemoglobin: <0.7 % (ref 0.0–1.5)
O2 Saturation: 60.7 %
Total hemoglobin: 9.4 g/dL — ABNORMAL LOW (ref 12.0–16.0)

## 2022-02-19 LAB — BASIC METABOLIC PANEL
Anion gap: 12 (ref 5–15)
BUN: 51 mg/dL — ABNORMAL HIGH (ref 8–23)
CO2: 30 mmol/L (ref 22–32)
Calcium: 8.3 mg/dL — ABNORMAL LOW (ref 8.9–10.3)
Chloride: 92 mmol/L — ABNORMAL LOW (ref 98–111)
Creatinine, Ser: 2.22 mg/dL — ABNORMAL HIGH (ref 0.61–1.24)
GFR, Estimated: 31 mL/min — ABNORMAL LOW (ref >60)
Glucose, Bld: 121 mg/dL — ABNORMAL HIGH (ref 70–99)
Potassium: 3.6 mmol/L (ref 3.5–5.1)
Sodium: 134 mmol/L — ABNORMAL LOW (ref 135–145)

## 2022-02-19 LAB — GLUCOSE, CAPILLARY
Glucose-Capillary: 136 mg/dL — ABNORMAL HIGH (ref 70–99)
Glucose-Capillary: 140 mg/dL — ABNORMAL HIGH (ref 70–99)
Glucose-Capillary: 160 mg/dL — ABNORMAL HIGH (ref 70–99)
Glucose-Capillary: 185 mg/dL — ABNORMAL HIGH (ref 70–99)

## 2022-02-19 LAB — HEPARIN LEVEL (UNFRACTIONATED): Heparin Unfractionated: 1.06 IU/mL — ABNORMAL HIGH (ref 0.30–0.70)

## 2022-02-19 LAB — APTT: aPTT: 64 seconds — ABNORMAL HIGH (ref 24–36)

## 2022-02-19 LAB — MAGNESIUM: Magnesium: 2.1 mg/dL (ref 1.7–2.4)

## 2022-02-19 SURGERY — RIGHT HEART CATH
Anesthesia: LOCAL

## 2022-02-19 MED ORDER — HEPARIN (PORCINE) IN NACL 1000-0.9 UT/500ML-% IV SOLN
INTRAVENOUS | Status: AC
  Filled 2022-02-19: qty 1000

## 2022-02-19 MED ORDER — ACETAMINOPHEN 325 MG PO TABS: 650.0000 mg | ORAL_TABLET | ORAL | Status: DC | PRN

## 2022-02-19 MED ORDER — SODIUM CHLORIDE 0.9% FLUSH
3.0000 mL | INTRAVENOUS | Status: DC | PRN
  Administered 2022-02-23: 3 mL via INTRAVENOUS

## 2022-02-19 MED ORDER — RIVAROXABAN 20 MG PO TABS
20.0000 mg | ORAL_TABLET | Freq: Every day | ORAL | Status: DC
  Administered 2022-02-19 – 2022-02-28 (×10): 20 mg via ORAL
  Filled 2022-02-19 (×10): qty 1

## 2022-02-19 MED ORDER — HYDRALAZINE HCL 20 MG/ML IJ SOLN: 10.0000 mg | INTRAMUSCULAR | Status: AC | PRN

## 2022-02-19 MED ORDER — LIDOCAINE HCL (PF) 1 % IJ SOLN
INTRAMUSCULAR | Status: DC | PRN
  Administered 2022-02-19: 10 mL

## 2022-02-19 MED ORDER — SODIUM CHLORIDE 0.9% FLUSH
3.0000 mL | Freq: Two times a day (BID) | INTRAVENOUS | Status: DC
  Administered 2022-02-19 – 2022-02-26 (×14): 3 mL via INTRAVENOUS

## 2022-02-19 MED ORDER — HEPARIN (PORCINE) IN NACL 1000-0.9 UT/500ML-% IV SOLN
INTRAVENOUS | Status: DC | PRN
  Administered 2022-02-19: 500 mL

## 2022-02-19 MED ORDER — ONDANSETRON HCL 4 MG/2ML IJ SOLN: 4.0000 mg | Freq: Four times a day (QID) | INTRAMUSCULAR | Status: DC | PRN

## 2022-02-19 MED ORDER — LABETALOL HCL 5 MG/ML IV SOLN: 10.0000 mg | INTRAVENOUS | Status: AC | PRN

## 2022-02-19 MED ORDER — SODIUM CHLORIDE 0.9 % IV SOLN: 250.0000 mL | INTRAVENOUS | Status: DC | PRN

## 2022-02-19 MED ORDER — LIDOCAINE HCL (PF) 1 % IJ SOLN
INTRAMUSCULAR | Status: AC
  Filled 2022-02-19: qty 30

## 2022-02-19 SURGICAL SUPPLY — 7 items
CATH SWAN GANZ 7F STRAIGHT (CATHETERS) ×1 IMPLANT
KIT MICROPUNCTURE NIT STIFF (SHEATH) IMPLANT
MAT PREVALON FULL STRYKER (MISCELLANEOUS) ×1 IMPLANT
PACK CARDIAC CATHETERIZATION (CUSTOM PROCEDURE TRAY) ×3 IMPLANT
SHEATH PINNACLE 7F 10CM (SHEATH) ×1 IMPLANT
SHEATH PROBE COVER 6X72 (BAG) ×1 IMPLANT
TRANSDUCER W/STOPCOCK (MISCELLANEOUS) ×3 IMPLANT

## 2022-02-19 NOTE — Consult Note (Signed)
? ?  [facility] CM Inpatient Consult ? ? ?02/19/2022 ? ?Lamarr Lulas Favor ?[birth year] ?[id number] ? ?[facility] [facility] [ACO] Patient: [employer] ? ?Primary Care Provider:  Tammi Sou, MD, [facility], is an embedded provider with a Chronic Care Management team and program, and is listed for the transition of care follow up and appointments. ? ?Patient was screened for Embedded practice service needs for chronic care management for post hospital care needs. ? ?Met with the patient at the bedside.  Explained to the this writer's role for post hospital follow up.  Patient states he has worked with [facility] in the past.  Patient states he has been active with [facility] and he has been at [employer] near [place] and not sure if he will be going to a rehab stay from the hospital, yet.  Has not worked with PT/OT yet. ? ? ?Plan: Continue to follow with TOC needs for post hospital needs. Will share information with the inpatient TOC team. ? ?Please contact for further questions, ? ?Natividad Brood, RN BSN CCM ?[facility] Liaison ? [phone] business mobile phone ?Toll free office [phone]  ?Fax number: [phone] ?[email or web address] ? ? ? ?

## 2022-02-19 NOTE — Care Management Important Message (Signed)
Important Message ? ?Patient Details  ?Name: [name] ?MRN: [id number] ?Date of Birth: 1951/08/24 ? ? ?Medicare Important Message Given:  Yes ? ? ? ? ?[name] ?02/19/2022, 7:49 AM ?

## 2022-02-19 NOTE — Progress Notes (Signed)
? ?[name]  [id number] DOB: 1951/06/11 DOA: 02/13/2022 ?PCP: Tammi Sou, MD   ? ?Brief Narrative:  ?71 year old with a history of atrial fibrillation, diastolic CHF, HTN, DM, morbid obesity, and chronic venous stasis who presented to the [facility] ER 02/13/2022 with hypoxia and significant weight gain and was diagnosed with an acute CHF exacerbation.  He was additionally found to have an acute UTI. ? ?Consultants:  ?Advanced heart failure team ? ?Code Status: FULL CODE ? ?DVT prophylaxis: ?Xarelto ? ?Interim Hx: ?Afebrile.  Vitals are stable.  Requiring 3 L nasal cannula to keep saturations 92%.  Creatinine climbing.  Resting comfortably in bed.  Has no new complaints today.  Denies chest pain nausea vomiting or abdominal pain. ? ?Assessment & Plan: ? ?Acute exacerbation of diastolic congestive heart failure ?TTE this admission noted EF 55-60% -Lasix and Zaroxolyn utilized but patient slow to improve -patient transferred to [facility] to undergo right-sided heart cath and evaluation by advanced heart failure team ? ?Acute hypoxic respiratory failure ?Due to above -monitor with ongoing diuresis -not in extremis ? ?Acute kidney injury on CKD stage IIIb ?Baseline creatinine appears to be approximately 1.5-1.7 -monitor trend with ongoing diuresis -renal function may soon limit ability to effectively remove fluid ? ?Chronic atrial fibrillation ?Continue diltiazem and Xarelto for now -further care per cardiology ? ?DM2 ?CBG reasonably controlled -monitor trend without change in treatment today ? ?Chronic bilateral lower extremity venous stasis ?Bilateral lower extremity wraps to remain in place until 419 ? ?UTI POA ?To complete a 5-day course of Rocephin ? ?Morbid obesity - Body mass index is 56.48 kg/m?. ? ? ?Family Communication: No family present at time of exam ?Disposition: Unclear presently ? ? ?Objective: ?Blood pressure 119/64, pulse 69, temperature 97.6 ?F (36.4 ?C), resp. rate 18, height 5\' 10"   (1.778 m), weight (!) 178.5 kg, SpO2 92 %. ? ?Intake/Output Summary (Last 24 hours) at 02/19/2022 0901 ?Last data filed at 02/19/2022 0801 ?Gross per 24 hour  ?Intake 1057.06 ml  ?Output 1100 ml  ?Net -42.94 ml  ? ?Filed Weights  ? 02/16/22 0435 02/18/22 0526 02/19/22 0605  ?Weight: (!) 186.2 kg (!) 186.1 kg (!) 178.5 kg  ? ? ?Examination: ?General: No acute respiratory distress at rest in bed ?Lungs: Distant breath sounds in all fields, mild bibasilar crackles, no wheezing ?Cardiovascular: Distant heart sounds, regular, no rubs appreciated ?Abdomen: Morbidly obese, soft, bowel sounds hypoactive, no appreciable mass ?Extremities: 2+ chronic appearing bilateral lower extremities with bilateral lower extremities wrapped/dressed ? ?CBC: ?Recent Labs  ?Lab 02/13/22 ?1600 02/14/22 ?[id number] 02/19/22 ?0426  ?WBC 6.8 6.5 7.3  ?NEUTROABS 4.9  --   --   ?HGB 10.0* 9.6* 9.2*  ?HCT 32.6* 31.5* 29.7*  ?MCV 74.9* 75.9* 73.2*  ?PLT 235 240 206  ? ?Basic Metabolic Panel: ?Recent Labs  ?Lab 02/14/22 ?[id number] 02/15/22 ?0604 02/17/22 ?0630 02/18/22 ?0601 02/19/22 ?[id number]  ?NA 140   < > 139 136 134*  ?K 4.4   < > 3.6 3.6 3.6  ?CL 105   < > 99 95* 92*  ?CO2 28   < > 29 31 30   ?GLUCOSE 138*   < > 139* 121* 121*  ?BUN 29*   < > 41* 51* 51*  ?CREATININE 1.66*   < > 1.77* 2.17* 2.22*  ?CALCIUM 8.4*   < > 8.7* 8.6* 8.3*  ?MG 1.9   < > 1.9 2.2 2.1  ?PHOS 4.2  --   --   --   --   ? < > =  values in this interval not displayed.  ? ?GFR: ?Estimated Creatinine Clearance: 50.5 mL/min (A) (by C-G formula based on SCr of 2.22 mg/dL (H)). ? ?HbA1C: ?Hgb A1c MFr Bld  ?Date/Time Value Ref Range Status  ?01/09/2022 09:11 AM 8.4 (H) 4.6 - 6.5 % Final  ?  Comment:  ?  Glycemic Control Guidelines for People with Diabetes:Non Diabetic:  <6%Goal of Therapy: <7%Additional Action Suggested:  >8%   ?10/10/2021 01:29 PM 7.9 (H) 4.6 - 6.5 % Final  ?  Comment:  ?  Glycemic Control Guidelines for People with Diabetes:Non Diabetic:  <6%Goal of Therapy: <7%Additional Action  Suggested:  >8%   ? ? ?CBG: ?Recent Labs  ?Lab 02/18/22 ?0751 02/18/22 ?1125 02/18/22 ?1801 02/18/22 ?2106 02/19/22 ?[id number]  ?GLUCAP 135* 148* 138* 145* 136*  ? ? ?Scheduled Meds: ? [MAR Hold] Chlorhexidine Gluconate Cloth  6 each Topical Daily  ? [MAR Hold] diltiazem  120 mg Oral Daily  ? [MAR Hold] insulin aspart  0-15 Units Subcutaneous TID WC  ? [MAR Hold] insulin aspart  0-5 Units Subcutaneous QHS  ? [MAR Hold] insulin aspart protamine- aspart  20 Units Subcutaneous BID WC  ? [MAR Hold] isosorbide mononitrate  30 mg Oral Daily  ? [MAR Hold] loratadine  10 mg Oral Daily  ? [MAR Hold] multivitamin with minerals  1 tablet Oral Daily  ? [MAR Hold] sodium chloride flush  10-40 mL Intracatheter Q12H  ? sodium chloride flush  3 mL Intravenous Q12H  ? [MAR Hold] terazosin  10 mg Oral QHS  ? ?Continuous Infusions: ? sodium chloride    ? sodium chloride 10 mL/hr at 02/19/22 0650  ? [MAR Hold] cefTRIAXone (ROCEPHIN)  IV 1 g (02/18/22 1101)  ? heparin 1,900 Units/hr (02/19/22 0654)  ? ? ? LOS: 6 days  ? ?Cherene Altes, MD ?[facility] ?Office  [phone] ?Pager - Text Page per Shea Evans ? ?If 7PM-7AM, please contact night-coverage per Amion ?02/19/2022, 9:01 AM ? ? ? ? ?

## 2022-02-19 NOTE — Plan of Care (Signed)
?  Problem: Education: ?Goal: Knowledge of General Education information will improve ?Description: Including pain rating scale, medication(s)/side effects and non-pharmacologic comfort measures ?Outcome: Progressing ?  ?Problem: Health Behavior/Discharge Planning: ?Goal: Ability to manage health-related needs will improve ?Outcome: Progressing ?  ?Problem: Clinical Measurements: ?Goal: Ability to maintain clinical measurements within normal limits will improve ?Outcome: Progressing ?Goal: Will remain free from infection ?Outcome: Progressing ?Goal: Diagnostic test results will improve ?Outcome: Progressing ?Goal: Respiratory complications will improve ?Outcome: Progressing ?Goal: Cardiovascular complication will be avoided ?Outcome: Progressing ?  ?Problem: Activity: ?Goal: Risk for activity intolerance will decrease ?Outcome: Progressing ?  ?Problem: Nutrition: ?Goal: Adequate nutrition will be maintained ?Outcome: Progressing ?  ?Problem: Coping: ?Goal: Level of anxiety will decrease ?Outcome: Progressing ?  ?Problem: Elimination: ?Goal: Will not experience complications related to bowel motility ?Outcome: Progressing ?Goal: Will not experience complications related to urinary retention ?Outcome: Progressing ?  ?Problem: Pain Managment: ?Goal: General experience of comfort will improve ?Outcome: Progressing ?  ?Problem: Safety: ?Goal: Ability to remain free from injury will improve ?Outcome: Progressing ?  ?Problem: Skin Integrity: ?Goal: Risk for impaired skin integrity will decrease ?Outcome: Progressing ?  ?Problem: Education: ?Goal: Ability to demonstrate management of disease process will improve ?Outcome: Progressing ?Goal: Ability to verbalize understanding of medication therapies will improve ?Outcome: Progressing ?Goal: Individualized Educational Video(s) ?Outcome: Progressing ?  ?Problem: Activity: ?Goal: Capacity to carry out activities will improve ?Outcome: Progressing ?  ?Problem: Cardiac: ?Goal:  Ability to achieve and maintain adequate cardiopulmonary perfusion will improve ?Outcome: Progressing ?   ? ?Patient bedrest for one hour after heart cath. 1-2+ with rollator walker. Order for CVP daily=2.  ?

## 2022-02-19 NOTE — Progress Notes (Signed)
ANTICOAGULATION CONSULT NOTE - Follow Up Consult ? ?Pharmacy Consult for heparin ?Indication: atrial fibrillation ? ?Labs: ?Recent Labs  ?  02/17/22 ?0630 02/18/22 ?0601 02/19/22 ?0426  ?HGB  --   --  9.2*  ?HCT  --   --  29.7*  ?PLT  --   --  206  ?APTT  --   --  64*  ?HEPARINUNFRC  --   --  1.06*  ?CREATININE 1.77* 2.17* 2.22*  ? ? ?Assessment: ?71yo male slightly subtherapeutic on heparin with initial dosing while Xarelto on hold; no infusion issues or signs of bleeding per RN. ? ?Goal of Therapy:  ?aPTT 66-102 seconds ?  ?Plan:  ?Will increase heparin infusion by 10% to 1900 units/hr and check PTT in 8 hours.   ? ?Wynona Neat, PharmD, BCPS  ?02/19/2022,5:31 AM ? ? ?

## 2022-02-19 NOTE — Progress Notes (Signed)
PT Cancellation Note ? ?Patient Details ?Name: [name] ?MRN: [id number] ?DOB: [birth year] ? ? ?Cancelled Treatment:    Reason Eval/Treat Not Completed: Patient at procedure or test/unavailable Pt off floor at heart cath. Will follow. ? ? ?Blake Divine A Ashish Rossetti ?02/19/2022, 8:41 AM ?Vale Haven, PT, DPT ?Acute Rehabilitation Services ?Secure chat preferred ?Office [phone] ? ? ? ? ?

## 2022-02-19 NOTE — Progress Notes (Signed)
PT Cancellation Note ? ?Patient Details ?Name: [name] ?MRN: [id number] ?DOB: 10-25-1951 ? ? ?Cancelled Treatment:    Reason Eval/Treat Not Completed: Patient declined, no reason specified Pt declining therapy this afternoon, asking to wait until tomorrow due to swelling in abdomen and LEs. Will follow. ? ? ?Blake Divine A Sherrie Marsan ?02/19/2022, 2:31 PM ?Vale Haven, PT, DPT ?Acute Rehabilitation Services ?Secure chat preferred ?Office [phone] ? ? ? ?

## 2022-02-19 NOTE — Progress Notes (Signed)
Received report from cath lab. Patient will be returning back to room. Per nurse, provider went through Right [facility] ?

## 2022-02-19 NOTE — TOC Progression Note (Signed)
Transition of Care (TOC) - Progression Note  ? ? ?Patient Details  ?Name: [name] ?MRN: [id number] ?Date of Birth: 03/01/1951 ? ?Transition of Care (TOC) CM/SW Contact  ?Leone Haven, RN ?Phone Number: ?02/19/2022, 4:04 PM ? ?Clinical Narrative:    ?[place] hotel, [employer] [place],  he was brought here from [facility],  he has been working with the Engineer, water, Juliann Pulse [phone].  He goes to [facility] care every Wed to ge his legs re wrapped.  He is hopeful that he will be able to work with physical therapy tomorrow, he may be able to go to a SNF.  He is on oxygen 4 liters [place], and hopes to be weaned off the oxygen before he is discharged.  He has a PCP and has an apt in May, he says.  We will see what physical therapy recommends tomorrow and go from there.  Patient states his car is at [employer].  TOC will continue to follow for dc needs.  ? ? ?Expected Discharge Plan: Skilled Nursing Facility ?Barriers to Discharge: Continued Medical Work up ? ?Expected Discharge Plan and Services ?Expected Discharge Plan: Skilled Nursing Facility ?In-house Referral: NA ?Discharge Planning Services: CM Consult ?  ?Living arrangements for the past 2 months: Hotel/Motel ?                ?  ?DME Agency: NA ?  ?  ?  ?  ?  ?  ?  ?  ? ? ?Social Determinants of Health (SDOH) Interventions ?  ? ?Readmission Risk Interventions ? ?  02/15/2022  ?  2:32 PM  ?Readmission Risk Prevention Plan  ?Transportation Screening Complete  ?PCP or Specialist Appt within 5-7 Days Complete  ? ? ?

## 2022-02-19 NOTE — Interval H&P Note (Signed)
History and Physical Interval Note: ? ?02/19/2022 ?8:33 AM ? ?[name]  has presented today for surgery, with the diagnosis of chf.  The various methods of treatment have been discussed with the patient and family. After consideration of risks, benefits and other options for treatment, the patient has consented to  Procedure(s): ?RIGHT HEART CATH (N/A) as a surgical intervention.  The patient's history has been reviewed, patient examined, no change in status, stable for surgery.  I have reviewed the patient's chart and labs.  Questions were answered to the patient's satisfaction.   ? ? ?Rahul Malinak ? ? ?

## 2022-02-20 ENCOUNTER — Encounter (HOSPITAL_BASED_OUTPATIENT_CLINIC_OR_DEPARTMENT_OTHER): Payer: Medicare Other | Admitting: Physician Assistant

## 2022-02-20 DIAGNOSIS — I482 Chronic atrial fibrillation, unspecified: Secondary | ICD-10-CM | POA: Diagnosis not present

## 2022-02-20 DIAGNOSIS — E1142 Type 2 diabetes mellitus with diabetic polyneuropathy: Secondary | ICD-10-CM | POA: Diagnosis not present

## 2022-02-20 DIAGNOSIS — I10 Essential (primary) hypertension: Secondary | ICD-10-CM | POA: Diagnosis not present

## 2022-02-20 DIAGNOSIS — I5033 Acute on chronic diastolic (congestive) heart failure: Secondary | ICD-10-CM | POA: Diagnosis not present

## 2022-02-20 DIAGNOSIS — E66813 Obesity, class 3: Secondary | ICD-10-CM

## 2022-02-20 DIAGNOSIS — N189 Chronic kidney disease, unspecified: Secondary | ICD-10-CM | POA: Diagnosis present

## 2022-02-20 DIAGNOSIS — I890 Lymphedema, not elsewhere classified: Secondary | ICD-10-CM | POA: Diagnosis not present

## 2022-02-20 LAB — COMPREHENSIVE METABOLIC PANEL
ALT: 17 U/L (ref 0–44)
AST: 17 U/L (ref 15–41)
Albumin: 2.9 g/dL — ABNORMAL LOW (ref 3.5–5.0)
Alkaline Phosphatase: 63 U/L (ref 38–126)
Anion gap: 8 (ref 5–15)
BUN: 50 mg/dL — ABNORMAL HIGH (ref 8–23)
CO2: 31 mmol/L (ref 22–32)
Calcium: 8.3 mg/dL — ABNORMAL LOW (ref 8.9–10.3)
Chloride: 96 mmol/L — ABNORMAL LOW (ref 98–111)
Creatinine, Ser: 1.89 mg/dL — ABNORMAL HIGH (ref 0.61–1.24)
GFR, Estimated: 38 mL/min — ABNORMAL LOW (ref >60)
Glucose, Bld: 139 mg/dL — ABNORMAL HIGH (ref 70–99)
Potassium: 3.8 mmol/L (ref 3.5–5.1)
Sodium: 135 mmol/L (ref 135–145)
Total Bilirubin: 1.1 mg/dL (ref 0.3–1.2)
Total Protein: 6.4 g/dL — ABNORMAL LOW (ref 6.5–8.1)

## 2022-02-20 LAB — CBC
HCT: 29.2 % — ABNORMAL LOW (ref 39.0–52.0)
Hemoglobin: 9.1 g/dL — ABNORMAL LOW (ref 13.0–17.0)
MCH: 22.7 pg — ABNORMAL LOW (ref 26.0–34.0)
MCHC: 31.2 g/dL (ref 30.0–36.0)
MCV: 72.8 fL — ABNORMAL LOW (ref 80.0–100.0)
Platelets: 210 10*3/uL (ref 150–400)
RBC: 4.01 MIL/uL — ABNORMAL LOW (ref 4.22–5.81)
RDW: 18.5 % — ABNORMAL HIGH (ref 11.5–15.5)
WBC: 7.5 10*3/uL (ref 4.0–10.5)
nRBC: 0 % (ref 0.0–0.2)

## 2022-02-20 LAB — POCT I-STAT EG7
Acid-Base Excess: 2 mmol/L (ref 0.0–2.0)
Acid-Base Excess: 4 mmol/L — ABNORMAL HIGH (ref 0.0–2.0)
Bicarbonate: 28.3 mmol/L — ABNORMAL HIGH (ref 20.0–28.0)
Bicarbonate: 30 mmol/L — ABNORMAL HIGH (ref 20.0–28.0)
Calcium, Ion: 0.91 mmol/L — ABNORMAL LOW (ref 1.15–1.40)
Calcium, Ion: 1.01 mmol/L — ABNORMAL LOW (ref 1.15–1.40)
HCT: 28 % — ABNORMAL LOW (ref 39.0–52.0)
HCT: 30 % — ABNORMAL LOW (ref 39.0–52.0)
Hemoglobin: 10.2 g/dL — ABNORMAL LOW (ref 13.0–17.0)
Hemoglobin: 9.5 g/dL — ABNORMAL LOW (ref 13.0–17.0)
O2 Saturation: 63 %
O2 Saturation: 68 %
Potassium: 3 mmol/L — ABNORMAL LOW (ref 3.5–5.1)
Potassium: 3.2 mmol/L — ABNORMAL LOW (ref 3.5–5.1)
Sodium: 140 mmol/L (ref 135–145)
Sodium: 143 mmol/L (ref 135–145)
TCO2: 30 mmol/L (ref 22–32)
TCO2: 31 mmol/L (ref 22–32)
pCO2, Ven: 48.9 mmHg (ref 44–60)
pCO2, Ven: 50.1 mmHg (ref 44–60)
pH, Ven: 7.37 (ref 7.25–7.43)
pH, Ven: 7.385 (ref 7.25–7.43)
pO2, Ven: 34 mmHg (ref 32–45)
pO2, Ven: 37 mmHg (ref 32–45)

## 2022-02-20 LAB — GLUCOSE, CAPILLARY
Glucose-Capillary: 127 mg/dL — ABNORMAL HIGH (ref 70–99)
Glucose-Capillary: 136 mg/dL — ABNORMAL HIGH (ref 70–99)
Glucose-Capillary: 175 mg/dL — ABNORMAL HIGH (ref 70–99)
Glucose-Capillary: 179 mg/dL — ABNORMAL HIGH (ref 70–99)

## 2022-02-20 LAB — MAGNESIUM: Magnesium: 2.3 mg/dL (ref 1.7–2.4)

## 2022-02-20 LAB — COOXEMETRY PANEL
Carboxyhemoglobin: 2.3 % — ABNORMAL HIGH (ref 0.5–1.5)
Methemoglobin: <0.7 % (ref 0.0–1.5)
O2 Saturation: 66.2 %
Total hemoglobin: 9.4 g/dL — ABNORMAL LOW (ref 12.0–16.0)

## 2022-02-20 MED ORDER — NYSTATIN 100000 UNIT/GM EX CREA
TOPICAL_CREAM | Freq: Two times a day (BID) | CUTANEOUS | Status: DC
  Filled 2022-02-20: qty 30

## 2022-02-20 MED ORDER — METOLAZONE 5 MG PO TABS
5.0000 mg | ORAL_TABLET | Freq: Once | ORAL | Status: AC
  Administered 2022-02-20: 5 mg via ORAL
  Filled 2022-02-20: qty 1

## 2022-02-20 MED ORDER — FUROSEMIDE 10 MG/ML IJ SOLN
10.0000 mg/h | INTRAVENOUS | Status: DC
  Administered 2022-02-20: 8 mg/h via INTRAVENOUS
  Administered 2022-02-22 – 2022-02-27 (×7): 10 mg/h via INTRAVENOUS
  Filled 2022-02-20 (×11): qty 20

## 2022-02-20 MED FILL — Heparin Sod (Porcine)-NaCl IV Soln 1000 Unit/500ML-0.9%: INTRAVENOUS | Qty: 500 | Status: AC

## 2022-02-20 NOTE — Assessment & Plan Note (Addendum)
CKD stage 3b. Hyponatremia. Hypokalemia ? ?Stable serum cr at 1,91, with K at 3,7 and serum bicarbonate at 33. NA 131.  ? ?Diuresis with furosemide drip 10 mg per hr along with daily metolazone  ?No empagliflozin due to suspected urine infection.  ?Continue with spironolactone and will add acetazolamide ? ?Continue close follow up on renal function and electrolytes.  ?

## 2022-02-20 NOTE — Progress Notes (Signed)
Heart Failure Navigator Progress Note ? ?Following this hospitalization to assess for HV TOC readiness.  ? ?Tatsuo Musial, BSN, RN ?Heart Failure Nurse Navigator ?Secure Chat Only   ?

## 2022-02-20 NOTE — Progress Notes (Signed)
Physical Therapy Treatment ?Patient Details ?Name: [name] ?MRN: [id number] ?DOB: 22-Apr-1951 ?Today's Date: 02/20/2022 ? ? ?History of Present Illness Patient is 71 y.o. male presented to the ER on 02/13/2022 with weight gain and hypoxia. Pt with acute on chronic chf and UTI. Transferred to [facility] on 4/17 and underwent heart cath on 4/18.PMH significant for A-fib, HFpEF, HTN, DMII, morbid obesity, venous stasis, lymphedema. ? ?  ?PT Comments  ? ? Pt with significant decline in mobility since last seen by therapy at [facility] prior to transfer to [facility]. Required 3 people to stand from recliner and unable to take any steps. Will need ST-SNF at dc.    ?Recommendations for follow up therapy are one component of a multi-disciplinary discharge planning process, led by the attending physician.  Recommendations may be updated based on patient status, additional functional criteria and insurance authorization. ? ?Follow Up Recommendations ? Skilled nursing-short term rehab (<3 hours/day) ?  ?  ?Assistance Recommended at Discharge Frequent or constant Supervision/Assistance  ?Patient can return home with the following Two people to help with walking and/or transfers;Two people to help with bathing/dressing/bathroom;Assist for transportation;Help with stairs or ramp for entrance;Assistance with cooking/housework ?  ?Equipment Recommendations ? None recommended by PT (pt needs brakes on rollator fixed)  ?  ?Recommendations for Other Services   ? ? ?  ?Precautions / Restrictions Precautions ?Precautions: Fall ?Restrictions ?Weight Bearing Restrictions: No  ?  ? ?Mobility ? Bed Mobility ?  ?  ?  ?  ?  ?  ?  ?General bed mobility comments: Pt in recliner ?  ? ?Transfers ?Overall transfer level: Needs assistance ?Equipment used: Rollator (4 wheels) ?Transfers: Sit to/from Stand ?Sit to Stand: +2 physical assistance, Max assist (+3 assist) ?  ?  ?  ?  ?  ?General transfer comment: 3 person assist to bring hips up from recliner and from  [facility] to stand. From recliner able to extend hips and trunk into upright as BSC was switched with recliner behind pt. ?  ? ?Ambulation/Gait ?  ?  ?  ?  ?  ?  ?  ?General Gait Details: Pt unable to take any steps ? ? ?Stairs ?  ?  ?  ?  ?  ? ? ?Wheelchair Mobility ?  ? ?Modified Rankin (Stroke Patients Only) ?  ? ? ?  ?Balance Overall balance assessment: Needs assistance ?Sitting-balance support: Feet supported ?Sitting balance-Leahy Scale: Fair ?  ?  ?Standing balance support: Reliant on assistive device for balance, During functional activity, Bilateral upper extremity supported ?Standing balance-Leahy Scale: Poor ?Standing balance comment: +2 mod assist to maintain stand for 20 sec ?  ?  ?  ?  ?  ?  ?  ?  ?  ?  ?  ?  ? ?  ?Cognition Arousal/Alertness: Awake/alert ?Behavior During Therapy: [facility] for tasks assessed/performed ?Overall Cognitive Status: Within Functional Limits for tasks assessed ?  ?  ?  ?  ?  ?  ?  ?  ?  ?  ?  ?  ?  ?  ?  ?  ?  ?  ?  ? ?  ?Exercises   ? ?  ?General Comments   ?  ?  ? ?Pertinent Vitals/Pain Pain Assessment ?Pain Assessment: No/denies pain  ? ? ?Home Living   ?  ?  ?  ?  ?  ?  ?  ?  ?  ?   ?  ?Prior Function    ?  ?  ?   ? ?  PT Goals (current goals can now be found in the care plan section) Acute Rehab PT Goals ?Patient Stated Goal: get rollator brakes fixed and get rid of fluid ?PT Goal Formulation: With patient ?Time For Goal Achievement: 03/06/22 ?Potential to Achieve Goals: Fair ?Progress towards PT goals: Not progressing toward goals - comment;Goals downgraded-see care plan ? ?  ?Frequency ? ? ? Min 3X/week ? ? ? ?  ?PT Plan Discharge plan needs to be updated  ? ? ?Co-evaluation   ?  ?  ?  ?  ? ?  ?AM-PAC PT "6 Clicks" Mobility   ?Outcome Measure ? Help needed turning from your back to your side while in a flat bed without using bedrails?: Total ?Help needed moving from lying on your back to sitting on the side of a flat bed without using bedrails?: Total ?Help needed moving to and  from a bed to a chair (including a wheelchair)?: Total ?Help needed standing up from a chair using your arms (e.g., wheelchair or bedside chair)?: Total ?Help needed to walk in hospital room?: Total ?Help needed climbing 3-5 steps with a railing? : Total ?6 Click Score: 6 ? ?  ?End of Session Equipment Utilized During Treatment: Oxygen;Gait belt ?Activity Tolerance: Patient limited by fatigue ?Patient left: with call bell/phone within reach;Other (comment) (on bsc) ?Nurse Communication: Mobility status;Need for lift equipment (Nursing staff present and assisting with all transfers) ?PT Visit Diagnosis: Unsteadiness on feet (R26.81);Muscle weakness (generalized) (M62.81);Difficulty in walking, not elsewhere classified (R26.2) ?  ? ? ?Time: 3762-8315 ?PT Time Calculation (min) (ACUTE ONLY): 14 min ? ?Charges:  $Therapeutic Activity: 8-22 mins          ?          ? ?[facility] PT ?Acute Rehabilitation Services ?Office [phone] ? ? ? ?Angelina Ok [facility] ?02/20/2022, 11:22 AM ? ?

## 2022-02-20 NOTE — Assessment & Plan Note (Addendum)
Patient with bilateral feet erythema, dorsal and medial aspect. ?He has completed IV ceftriaxone 5 days for a urinary tract infection (present on admission).  ? ?Plan to continue local care with antifungal.  ?Hold on bacterial antibiotic therapy for now.  ?Patient with dysuria, his urine culture had 40,000 CFU multiple species.  ? ?Repeat urine analysis with no signs of infection, continue to hold on antibiotic therapy.  ? ?

## 2022-02-20 NOTE — TOC Initial Note (Signed)
Transition of Care (TOC) - Initial/Assessment Note  ? ? ?Patient Details  ?Name: [name] ?MRN: [id number] ?Date of Birth: 20-Feb-1951 ? ?Transition of Care (TOC) CM/SW Contact:    ?Reece Agar, LCSWA ?Phone Number: ?02/20/2022, 4:34 PM ? ?Clinical Narrative:                 ?CSW received SNF consult. CSW met with pt. CSW introduced self and explained role at the hospital. Pt reports that PTA the pt live alone at a motel.  PT reports pt needs assistance with ADLS. Pt was able to use Rollator but requires 2-3 person maxA for sit to stan but was unable to take any steps.  ? ?CSW reviewed PT/OT recommendations for SNF. Pt reports being agreeable with SNF. Pt gave CSW permission to fax out to facilities in the area. Pt has no preference of facility at this time. CSW gave pt medicare.gov rating list to review.  ? ?CSW will continue to follow. ?  ? ?Expected Discharge Plan: [place] ?Barriers to Discharge: Continued Medical Work up ? ? ?Patient Goals and CMS Choice ?Patient states their goals for this hospitalization and ongoing recovery are:: hopes to wean off the oxygen ?  ?  ? ?Expected Discharge Plan and Services ?Expected Discharge Plan: [place] ?In-house Referral: NA ?Discharge Planning Services: CM Consult ?  ?Living arrangements for the past 2 months: Hotel/Motel ?                ?  ?DME Agency: NA ?  ?  ?  ?  ?  ?  ?  ?  ? ?Prior Living Arrangements/Services ?Living arrangements for the past 2 months: Hotel/Motel ?Lives with:: Self ?Patient language and need for interpreter reviewed:: Yes ?Do you feel safe going back to the place where you live?: Yes      ?Need for Family Participation in Patient Care: No (Comment) ?Care giver support system in place?: No (comment) ?Current home services: DME (uses a cane and a rollator) ?Criminal Activity/Legal Involvement Pertinent to Current Situation/Hospitalization: No - Comment as needed ? ?Activities of Daily Living ?Home Assistive  Devices/Equipment: [name] (specify type), Cane (specify quad or straight) (2 surgical shoes) ?ADL Screening (condition at time of admission) ?Patient's cognitive ability adequate to safely complete daily activities?: Yes ?Is the patient deaf or have difficulty hearing?: No ?Does the patient have difficulty seeing, even when wearing glasses/contacts?: No ?Does the patient have difficulty concentrating, remembering, or making decisions?: No ?Patient able to express need for assistance with ADLs?: Yes ?Does the patient have difficulty dressing or bathing?: No ?Independently performs ADLs?: No ?Communication: Independent ?Dressing (OT): Independent ?Grooming: Independent ?Feeding: Independent ?Bathing: Independent ?Toileting: Needs assistance ?Is this a change from baseline?: Pre-admission baseline ?In/Out Bed: Needs assistance ?Is this a change from baseline?: Pre-admission baseline ?Walks in Home: Needs assistance ?Is this a change from baseline?: Pre-admission baseline ?Does the patient have difficulty walking or climbing stairs?: Yes ?Weakness of Legs: None ?Weakness of Arms/Hands: None ? ?Permission Sought/Granted ?  ?  ?   ?   ?   ?   ? ?Emotional Assessment ?Appearance:: Appears stated age ?Attitude/Demeanor/Rapport: Engaged ?Affect (typically observed): Appropriate ?Orientation: : Oriented to Self, Oriented to Place, Oriented to  Time, Oriented to Situation ?Alcohol / Substance Use: Not Applicable ?Psych Involvement: No (comment) ? ?Admission diagnosis:  Acute on chronic combined systolic and diastolic congestive heart failure ([place]) [I50.43] ?Acute respiratory failure with hypoxia ([place]) [J96.01] ?Patient Active Problem List  ?  Diagnosis Date Noted  ? Acute kidney injury superimposed on chronic kidney disease ([place])   ? Class 3 obesity (HCC)   ? Acute on chronic diastolic CHF (congestive heart failure) ([place]) 02/13/2022  ? Uncontrolled type 2 diabetes mellitus with hyperglycemia ([place]) 03/04/2018  ? Chronic  diastolic heart failure ([place]) 12/24/2016  ? Lymphedema 02/22/2016  ? Diabetes with neurologic complications ([place]) 89/12/2838  ? Chronic headaches 05/10/2015  ? Equivocal stress test 10/24/2014  ? Atrial fibrillation ([place]) 08/15/2014  ? Healthcare maintenance 08/15/2014  ? Type 2 diabetes mellitus with diabetic neuropathy ([place]) 08/15/2014  ? 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 Recurrent cellulitis of lower extremity 08/02/2013  ? Lymphedema of both lower extremities 11/04/2008  ? Morbid obesity due to excess calories ([place]) 12/14/2007  ? Venous (peripheral) insufficiency 07/22/2007  ? DM2 (diabetes mellitus, type 2) ([place]) 08/11/2006  ? Hypertension, essential 08/11/2006  ? ALLERGIC RHINITIS 08/11/2006  ? ASTHMA 08/11/2006  ? Asthma 08/11/2006  ? ?PCP:  Tammi Sou, MD ?Pharmacy:   ?[facility] ?[address] ?[place] [address] ?Phone: [phone] Fax: [phone] ? ? ? ? ?Social Determinants of Health (SDOH) Interventions ?  ? ?Readmission Risk Interventions ? ?  02/15/2022  ?  2:32 PM  ?Readmission Risk Prevention Plan  ?Transportation Screening Complete  ?PCP or Specialist Appt within 5-7 Days Complete  ? ? ? ?

## 2022-02-20 NOTE — Progress Notes (Signed)
? ?Progress Note ? ?Patient Name: [name] ?Date of Encounter: 02/20/2022 ? ?[facility] Cardiologist: Pixie Casino, MD  ? ?Subjective  ? ?Had right heart cath yesterday consistent with marked volume overload.  Recommendations were to start on Lasix drip. ? ?Inpatient Medications  ?  ?Scheduled Meds: ? Chlorhexidine Gluconate Cloth  6 each Topical Daily  ? diltiazem  120 mg Oral Daily  ? insulin aspart  0-15 Units Subcutaneous TID WC  ? insulin aspart  0-5 Units Subcutaneous QHS  ? insulin aspart protamine- aspart  20 Units Subcutaneous BID WC  ? isosorbide mononitrate  30 mg Oral Daily  ? loratadine  10 mg Oral Daily  ? multivitamin with minerals  1 tablet Oral Daily  ? rivaroxaban  20 mg Oral Daily  ? sodium chloride flush  10-40 mL Intracatheter Q12H  ? sodium chloride flush  3 mL Intravenous Q12H  ? terazosin  10 mg Oral QHS  ? ?Continuous Infusions: ? sodium chloride    ? cefTRIAXone (ROCEPHIN)  IV Stopped (02/19/22 1036)  ? ?PRN Meds: ?sodium chloride, acetaminophen **OR** [DISCONTINUED] acetaminophen, albuterol, guaiFENesin, hydrALAZINE, HYDROcodone-acetaminophen, metoprolol tartrate, ondansetron **OR** ondansetron (ZOFRAN) IV, ondansetron (ZOFRAN) IV, senna-docusate, sodium chloride flush, sodium chloride flush  ? ?Vital Signs  ?  ?Vitals:  ? 02/19/22 2037 02/20/22 0033 02/20/22 0313 02/20/22 0320  ?BP: (!) 141/75 (!) 154/73 (!) 119/55   ?Pulse: 74 83 75   ?Resp: 15 16 17    ?Temp: (!) 97.5 ?F (36.4 ?C) 98.5 ?F (36.9 ?C) 97.9 ?F (36.6 ?C)   ?TempSrc: Oral Oral Oral   ?SpO2: 97% 96% 95%   ?Weight:    (!) 178.7 kg  ?Height:      ? ? ?Intake/Output Summary (Last 24 hours) at 02/20/2022 0804 ?Last data filed at 02/20/2022 0526 ?Gross per 24 hour  ?Intake 1096.2 ml  ?Output 1100 ml  ?Net -3.8 ml  ? ? ? ?  02/20/2022  ?  3:20 AM 02/19/2022  ?  6:05 AM 02/18/2022  ?  5:26 AM  ?Last 3 Weights  ?Weight (lbs) 394 lb 393 lb 9.6 oz 410 lb 4.4 oz  ?Weight (kg) 178.717 kg 178.536 kg 186.1 kg  ?   ? ?Telemetry  ?   ?Atrial fibrillation with controlled ventricular response- Personally Reviewed ? ?ECG  ?  ?No new tracings - Personally Reviewed ? ?Physical Exam  ? ?GEN: Morbidly obese ?HEENT: Normal ?NECK: No JVD; No carotid bruits ?LYMPHATICS: No lymphadenopathy ?CARDIAC: Irregularly irregular, no murmurs, rubs, gallops ?RESPIRATORY:  Clear to auscultation without rales, wheezing or rhonchi  ?ABDOMEN: Soft, non-tender, non-distended ?MUSCULOSKELETAL:  Marked BLE edema into his feet  No deformity  ?SKIN: marked erythema of his feet bilaterally especially towards his toes  ?NEUROLOGIC:  Alert and oriented x 3 ?PSYCHIATRIC:  Normal affect   ?Labs  ?  ?High Sensitivity Troponin:   ?Recent Labs  ?Lab 02/13/22 ?1600 02/13/22 ?1800  ?TROPONINIHS 9 9  ? ?   ?Chemistry ?Recent Labs  ?Lab 02/18/22 ?0601 02/19/22 ?0426 02/19/22 ?[id number] 02/20/22 ?[id number]  ?NA 136 134* 137 135  ?K 3.6 3.6 3.5 3.8  ?CL 95* 92*  --  96*  ?CO2 31 30  --  31  ?GLUCOSE 121* 121*  --  139*  ?BUN 51* 51*  --  50*  ?CREATININE 2.17* 2.22*  --  1.89*  ?CALCIUM 8.6* 8.3*  --  8.3*  ?MG 2.2 2.1  --  2.3  ?PROT  --   --   --  6.4*  ?ALBUMIN  --   --   --  2.9*  ?AST  --   --   --  17  ?ALT  --   --   --  17  ?ALKPHOS  --   --   --  63  ?BILITOT  --   --   --  1.1  ?GFRNONAA 32* 31*  --  38*  ?ANIONGAP 10 12  --  8  ? ?  ?Lipids No results for input(s): CHOL, TRIG, HDL, LABVLDL, LDLCALC, CHOLHDL in the last 168 hours.  ?Hematology ?Recent Labs  ?Lab 02/14/22 ?[id number] 02/19/22 ?[id number] 02/19/22 ?[id number] 02/20/22 ?[id number]  ?WBC 6.5 7.3  --  7.5  ?RBC 4.15* 4.06*  --  4.01*  ?HGB 9.6* 9.2* 10.9* 9.1*  ?HCT 31.5* 29.7* 32.0* 29.2*  ?MCV 75.9* 73.2*  --  72.8*  ?MCH 23.1* 22.7*  --  22.7*  ?MCHC 30.5 31.0  --  31.2  ?RDW 18.7* 18.4*  --  18.5*  ?PLT 240 206  --  210  ? ? ?Thyroid No results for input(s): TSH, FREET4 in the last 168 hours.  ?BNP ?Recent Labs  ?Lab 02/13/22 ?1601  ?BNP 293.8*  ? ?  ?DDimer No results for input(s): DDIMER in the last 168 hours.  ? ?Radiology  ?  ?CARDIAC  CATHETERIZATION ? ?Result Date: 02/19/2022 ?Findings: RA = 18 RV = 60/24 PA =  63/19 (35) PCW = 22 Fick cardiac output/index = 10.5/3.8 Thermo CO/CI = 7.2/2.6 PVR = 1.9 WU (Thermo) FA sat = 92% PA sat = 63%, 65% High SVC = 68% PAPi = 2.4 Assessment: 1. Marked volume overload with R>>L heart failure 2. Preserved cardiac output Plan/Discussion: Remains markedly volume overloaded. Consider use of lasix gtt +/- acetazolamide or metolazone to facilitate diuresis. Keep LEs wrapped. Arvilla Meresaniel Bensimhon, MD 9:20 AM  ? ?Cardiac Studies  ? ?Echocardiogram 02/14/22  ? 1. Left ventricular ejection fraction, by estimation, is 55 to 60%. The  ?left ventricle has normal function. The left ventricle has no regional  ?wall motion abnormalities. There is moderate left ventricular hypertrophy.  ?Left ventricular diastolic function  ? could not be evaluated.  ? 2. Right ventricular systolic function was not well visualized. The right  ?ventricular size is not well visualized.  ? 3. Left atrial size was mildly dilated.  ? 4. The mitral valve is normal in structure. No evidence of mitral valve  ?regurgitation.  ? 5. The aortic valve is normal in structure. There is mild calcification  ?of the aortic valve. Aortic valve regurgitation is trivial.  ? ?Patient Profile  ?   ?71 y.o. male with a hx of chronic atrial fibrillatin, chronic combined CHF, HTN, DM type 2, CKD stage 3a, RLS, OSA who is being seen 02/14/2022 for the evaluation of CHF at the request of Dr. Nelson ChimesAmin. ? ?Assessment & Plan  ?  ?Acute hypoxic respiratory failure ?Acute on chronic diastolic heart failure ?Right heart failure ?- echo  this admission with hyperdynamic EF ?- CXR with pulmonary vascular congestion, hazy bilateral opacities that may represent mild edema ?- BNP 294, but in the setting of morbid obesity ?- output not complete - pt was going to the bathroom without measuring ?- right heart moderately enlarged and fx moderately reduced ?- COOX 66 and CVP 12 this morning ?-  response to diuresis has been challenging, lasix increased to 120 mg IV BID - was on 80 mg IV BID, but creatinine bumped  ?-Right heart cath yesterday showed  marked volume overload right greater than left with cardiac output 7.2, cardiac index 2.6, PCW 22, PA P 63/19 with a mean of 35 mmHg ?-wound care in with patient and legs unwrapped -  he has massive LE edema into his feet with skin excoriations and probable cellulitis ?-start Lasix drip along with metolazone to help promote diuresis ?-Follow renal function closely while diuresing ?-Serum creatinine has dropped from 2.22-1.89 today but that was after holding Lasix yesterday ?-continue imdur ?-ARB on hold for AKI ?-PTA: amlodipine, cardizem 120 mg, 80 mg lasix BID  ? ?AoCKD 3 ?- sCr elevated on admission and bumped to 2.22 yesterday after which diuretics were held and serum creatinine 1.89 today ?-Still intravascularly volume overloaded by right heart cath yesterday ?-Starting Lasix drip and metolazone ? ?Chronic Afib ?-Heart rate controlled ?-Continue Cardizem CD 120 mg daily  ?-Continue Xarelto ? ?Bilateral LE cellulitis ?-TRH aware and starting antibx. ? ?Disposition ?Unclear - living in a motel PTA ?   ?I have spent a total of 35 minutes with patient reviewing cardiac cath, telemetry, EKGs, labs and examining patient as well as establishing an assessment and plan that was discussed with the patient.  > 50% of time was spent in direct patient care.    ? ?For questions or updates, please contact [place] ?Please consult [email or web address] for contact info under  ? ?  ?   ?Signed, ?Fransico Him, MD  ?02/20/2022, 8:04 AM   ? ?

## 2022-02-20 NOTE — Progress Notes (Signed)
Mobility Specialist Progress Note: ? ? 02/20/22 1430  ?Mobility  ?Activity Transferred to/from [facility]  ?Level of Assistance Maximum assist, patient does 25-49% ?(+3)  ?Assistive Device Four wheel walker  ?Activity Response Tolerated fair  ?$Mobility charge 1 Mobility  ? ?Pt requesting to get on [facility] for BM. Small BM successful. Pt required MaxA+3 to stand from chair/BSC with rollator. Pt back in chair with all needs met.  ? ?Nelta Numbers ?Acute Rehab ?Phone: [phone] ?Office Phone: [phone] ? ?

## 2022-02-20 NOTE — Hospital Course (Addendum)
Mr. Nodarse was admitted to the hospital with the working diagnosis of decompensated heart failure.  ? ?71 yo male with the past medical history of atrial fibrillation, hypertension, heart failure, T2DM and obesity who presented with dyspnea and weight gain. Reported progressive weight gain over 4 weeks, 25 lbs weight gain,  associated with lower extremity edema and dyspnea. On his initial physical examination his blood pressure was 129/50, HR 60, RR 20 and 02 saturation 80's on room air. Lungs with decreased breath sounds bilaterally, hearat with S1 and S2 present irregularly irregular, abdomen protuberant, positive lower extremity edema +++ bilaterally.  ? ?Na 138, K 4,7 Cl 105, bicarbonate 25, glucose 124 bun 29 cr 1,80 ?BNP 293  ?High sensitive troponin 9 and 9  ?Wbc 6,8 hgb 10 plt 235  ?Sars covid 19 negative  ? ?Chest radiograph with cardiomegaly and bilateral hilar vascular congestion.  ? ?EKG 65 bpm, right axis deviation, right bundle branch block, qtc 514, atrial fibrillation rhythm with no significant ST segment or T wave changes.  ? ?Patient was placed on furosemide for diuresis. ?Placed on antibiotic therapy for urinary tract infection.  ? ?PICC line was placed for further hemodynamic monitoring.  ? ?04/18 transferred to [facility] from [facility] for further cardiac workup ?Cardiac catheterization PCW 22, cardiac output 10,5 and index 3,8.  ?Placed on furosemide drip for further diuresis.  ? ?04/24 patient continue with significant volume overload despite aggressive diuresis.  ?04/25 continue with volume overload.  ?

## 2022-02-20 NOTE — Assessment & Plan Note (Addendum)
Blood pressure has remained stable, continue with isosorbide, irbesartan.  ?Aggressive diuresis.  ?

## 2022-02-20 NOTE — Consult Note (Addendum)
WOC Nurse Consult Note: ?Patient receiving care in [facility] 3E29 ?Reason for Consult: BLE venous stasis. Change leg wraps.  ?Wound type: Chronic Lymphedema and venous stasis with erythema and edema. States he is established with a Lymphedema clinic in [place]. ?Moisture in the pannus fold and under the breast. InterDry Ag+ [place] # [phone]) can be placed in the folds to wick the moisture.  ?Dressing procedure/placement/frequency: ?4 layer compression wraps removed. Bilateral LE were cleaned with soap and water. Diffuse open wounds on the LLE and the left foot and on the dorsal side of the right foot. Diffuse induration, edema, and erythema consistent with cellulitis. The bilateral toes are edematous with what appears to be fungal infection of the toenails. Dr. Ella Jubilee was present when wraps were off and was able to take photos of the legs and feet. Sween moisturizing lotion applied to the BLE except the open areas where cut to fit pieces of Aquacel Advantage was applied. Painful to the patient when the legs were lifted. ABD pads placed in the bend of the dorsal side of each foot at the lateral malleolus area for padding and over the posterior calf of the left leg. Both legs were then wrapped with 4 layer (Profore) compression wraps. These are to be changed once a week on Wednesday.  ? ?WOC will see next Wednesday 02/27/22 if he is still inpatient  ? ?Renaldo Reel Katrinka Blazing, MSN, RN, CMSRN, AGCNS, WTA ?Wound Treatment Associate ?Pager [phone]  ? ? ?  ?

## 2022-02-20 NOTE — Progress Notes (Signed)
Heart Failure Stewardship Pharmacist Progress Note ? ? ?PCP: [name], MD ?PCP-Cardiologist: [name], MD  ? ? ?HPI:  ?71 yo M with PMH of afib, CHF, HTN, T2DM, obesity, OSA, CKD III, and venous stasis. He presented to the ED following a PCP visit on 4/12 with shortness of breath, LE edema, and weight gain. CXR with cardiomegaly and pulmonary vascular congestion. An ECHO was done on 4/13 and LVEF is 55-60% (60-65% in 12/2017) with moderate LVH. RHC on 4/18 with marked volume overload and preserved cardiac output - RA 18, PA 35, wedge 22, CO 7.2. PICC line placed and coox monitoring. ? ?Current HF Medications: ?Diuretic: furosemide 10 mg/hr; metolazone 5 mg x 1 ?Other: Imdur 30 mg daily ? ?Prior to admission HF Medications: ?Diuretic: furosemide 80 mg daily ?ACE/ARB/ARNI: valsartan 320 mg daily ?Other: Imdur 30 mg daily ? ?Pertinent Lab Values: ?Serum creatinine 1.98, BUN 56, Potassium 3.8, Sodium 134, BNP 293.8, Magnesium 2.3, A1c 8.4  ?Coox 56, CVP 19 ? ?Vital Signs: ?Weight: 394 lbs (admission weight: 411 lbs) ?Blood pressure: 130/70s  ?Heart rate: 70-80s  ?I/O: -1L yesterday; net -2.3L (inaccurate) ? ?Medication Assistance / Insurance Benefits Check: ?Does the patient have prescription insurance?  Yes ?Type of insurance plan: [facility] Medicare  ? ?Outpatient Pharmacy:  ?Prior to admission outpatient pharmacy: [facility] ?Is the patient willing to use [facility] TOC pharmacy at discharge? Yes ?Is the patient willing to transition their outpatient pharmacy to utilize a [facility] outpatient pharmacy?   Pending ?  ? ?Assessment: ?1. Acute on chronic diastolic CHF (EF 11-91%). NYHA class III symptoms. ?- Agree with increasing drip rate of furosemide IV to 10 mg/hr and repeating metolazone ?- PTA valsartan on hold for AKI ?- No SGLT2i with current UTI ?- Continue Imdur 30 mg daily ?  ?Plan: ?1) Medication changes recommended at this time: ?- Agree with changes as above ? ?2) Patient assistance: ?- None pending ? ?3)   Education  ?- To be completed prior to discharge ? ?Sharen Hones, PharmD, BCPS ?Heart Failure Stewardship Pharmacist ?Phone [phone] ? ? ?

## 2022-02-20 NOTE — Progress Notes (Addendum)
Physical Therapy Treatment ?Patient Details ?Name: [name] ?MRN: [id number] ?DOB: 04-Jul-1951 ?Today's Date: 02/20/2022 ? ? ?History of Present Illness Patient is 71 y.o. male presented to the ER on 02/13/2022 with weight gain and hypoxia. Pt with acute on chronic chf and UTI. Transferred to [facility] on 4/17 and underwent heart cath on 4/18.PMH significant for A-fib, HFpEF, HTN, DMII, morbid obesity, venous stasis, lymphedema. ? ?  ?PT Comments  ? ? Returned to assist pt back off the bsc. Pt weaker and able to stand with 3 person assist but remained flexed forward over rollator and unable to come upright. Will need SNF at DC.    ?Recommendations for follow up therapy are one component of a multi-disciplinary discharge planning process, led by the attending physician.  Recommendations may be updated based on patient status, additional functional criteria and insurance authorization. ? ?Follow Up Recommendations ? Skilled nursing-short term rehab (<3 hours/day) ?  ?  ?Assistance Recommended at Discharge Frequent or constant Supervision/Assistance  ?Patient can return home with the following Two people to help with walking and/or transfers;Two people to help with bathing/dressing/bathroom;Assist for transportation;Help with stairs or ramp for entrance;Assistance with cooking/housework ?  ?Equipment Recommendations ? None recommended by PT (pt needs brakes on rollator fixed)  ?  ?Recommendations for Other Services   ? ? ?  ?Precautions / Restrictions Precautions ?Precautions: Fall ?Restrictions ?Weight Bearing Restrictions: No  ?  ? ?Mobility ? Bed Mobility ?  ?  ?  ?  ?  ?  ?  ?General bed mobility comments: Pt on BSC ?  ? ?Transfers ?Overall transfer level: Needs assistance ?Equipment used: Rollator (4 wheels) ?Transfers: Sit to/from Stand ?Sit to Stand: +2 physical assistance, Max assist (+3 assist) ?  ?  ?  ?  ?  ?General transfer comment: Standing from [facility] was more difficult and pt unable to extend hips and trunk  into upright. +3 max assist to bring up with pt flexed forward over rollator as recliner switched back for [facility] behind pt. ?  ? ?Ambulation/Gait ?  ?  ?  ?  ?  ?  ?  ?General Gait Details: Pt unable to take any steps ? ? ?Stairs ?  ?  ?  ?  ?  ? ? ?Wheelchair Mobility ?  ? ?Modified Rankin (Stroke Patients Only) ?  ? ? ?  ?Balance Overall balance assessment: Needs assistance ?Sitting-balance support: Feet supported ?Sitting balance-Leahy Scale: Fair ?  ?  ?Standing balance support: Reliant on assistive device for balance, During functional activity, Bilateral upper extremity supported ?Standing balance-Leahy Scale: Poor ?Standing balance comment: +2 mod assist to maintain stand flexed forward over rollator for 20 sec ?  ?  ?  ?  ?  ?  ?  ?  ?  ?  ?  ?  ? ?  ?Cognition Arousal/Alertness: Awake/alert ?Behavior During Therapy: [facility] for tasks assessed/performed ?Overall Cognitive Status: Within Functional Limits for tasks assessed ?  ?  ?  ?  ?  ?  ?  ?  ?  ?  ?  ?  ?  ?  ?  ?  ?  ?  ?  ? ?  ?Exercises   ? ?  ?General Comments   ?  ?  ? ?Pertinent Vitals/Pain Pain Assessment ?Pain Assessment: No/denies pain  ? ? ?Home Living   ?  ?  ?  ?  ?  ?  ?  ?  ?  ?   ?  ?Prior  Function    ?  ?  ?   ? ?PT Goals (current goals can now be found in the care plan section) Acute Rehab PT Goals ?Patient Stated Goal: get rollator brakes fixed and get rid of fluid ?PT Goal Formulation: With patient ?Time For Goal Achievement: 03/06/22 ?Potential to Achieve Goals: Fair ?Progress towards PT goals: Not progressing toward goals - comment ? ?  ?Frequency ? ? ? Min 3X/week ? ? ? ?  ?PT Plan Current plan remains appropriate  ? ? ?Co-evaluation   ?  ?  ?  ?  ? ?  ?AM-PAC PT "6 Clicks" Mobility   ?Outcome Measure ? Help needed turning from your back to your side while in a flat bed without using bedrails?: Total ?Help needed moving from lying on your back to sitting on the side of a flat bed without using bedrails?: Total ?Help needed moving to and  from a bed to a chair (including a wheelchair)?: Total ?Help needed standing up from a chair using your arms (e.g., wheelchair or bedside chair)?: Total ?Help needed to walk in hospital room?: Total ?Help needed climbing 3-5 steps with a railing? : Total ?6 Click Score: 6 ? ?  ?End of Session Equipment Utilized During Treatment: Oxygen;Gait belt ?Activity Tolerance: Patient limited by fatigue ?Patient left: with call bell/phone within reach;in chair;with nursing/sitter in room ?Nurse Communication: Mobility status;Need for lift equipment (Nursing staff present and assisting with all transfers) ?PT Visit Diagnosis: Unsteadiness on feet (R26.81);Muscle weakness (generalized) (M62.81);Difficulty in walking, not elsewhere classified (R26.2) ?  ? ? ?Time: [id number] ?PT Time Calculation (min) (ACUTE ONLY): 15 min ? ?Charges:  $Therapeutic Activity: 8-22 mins          ?          ? ?[facility] PT ?Acute Rehabilitation Services ?Office [phone] ? ? ? ?Shary Decamp [facility] ?02/20/2022, 11:27 AM ? ?

## 2022-02-20 NOTE — Patient Outreach (Signed)
[facility]) Care Management ? ?02/20/2022 ? ?[name] ?03-17-1951 ?[place]:[id number] ? ? ?Received MD Referral from PCP for care management and Social worker. Reviewed referral and assigned patient to Valente David, RN Care Coordinator for follow up. ? ?[name] ?[employer] Management Assistant ?[phone] ? ?

## 2022-02-20 NOTE — Assessment & Plan Note (Addendum)
Continue with diltiazem for rate control and anticoagulation with rivaroxaban.  ?

## 2022-02-20 NOTE — NC FL2 (Signed)
?[place] MEDICAID FL2 LEVEL OF CARE SCREENING TOOL  ?  ? ?IDENTIFICATION  ?Patient Name: ?[name] Birthdate: [birth year] Sex: male Admission Date (Current Location): ?02/13/2022  ?[place] Number: ? [place] ?  Facility and Address:  ?[facility], [address], [place], [place] [address] ?     Provider Number: ?[id number]  ?Attending Physician Name and Address:  ?Arrien, York Ram,* ? Relative Name and Phone Number:  ?Jaelon Gatley, [phone] ?   ?Current Level of Care: ?Hospital Recommended Level of Care: ?  Prior Approval Number: ?  ? ?Date Approved/Denied: ?  PASRR Number: ?[id number] A ? ?Discharge Plan: ?SNF ?  ? ?Current Diagnoses: ?Patient Active Problem List  ? Diagnosis Date Noted  ? Acute respiratory failure with hypoxia (HCC) 02/13/2022  ? Acute on chronic diastolic CHF (congestive heart failure) (HCC) 02/13/2022  ? Uncontrolled type 2 diabetes mellitus with hyperglycemia (HCC) 03/04/2018  ? Chronic diastolic heart failure (HCC) 12/24/2016  ? Lymphedema 02/22/2016  ? 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  ? Chronic headaches 05/10/2015  ? Equivocal stress test 10/24/2014  ? Atrial fibrillation (HCC) 08/15/2014  ? Healthcare maintenance 08/15/2014  ? Type 2 diabetes mellitus with diabetic neuropathy (HCC) 08/15/2014  ? 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 Recurrent cellulitis of lower extremity 08/02/2013  ? Lymphedema of both lower extremities 11/04/2008  ? Morbid obesity due to excess calories (HCC) 12/14/2007  ? Venous (peripheral) insufficiency 07/22/2007  ? Diabetic renal disease (HCC) 04/20/2007  ? DM2 (diabetes mellitus, type 2) (HCC) 08/11/2006  ? Hypertension, essential 08/11/2006  ? ALLERGIC RHINITIS 08/11/2006  ? ASTHMA 08/11/2006  ? Asthma 08/11/2006  ? ? ?Orientation RESPIRATION BLADDER Height & Weight   ?  ?Self, Time, Situation, Place ? Normal Continent Weight: (!) 394 lb (178.7 kg) ?Height:  5\' 10"  (177.8 cm)  ?BEHAVIORAL  SYMPTOMS/MOOD NEUROLOGICAL BOWEL NUTRITION STATUS  ?    Continent Diet (See Dc summary)  ?AMBULATORY STATUS COMMUNICATION OF NEEDS Skin   ?Extensive Assist Verbally Skin abrasions, Surgical wounds (L foot incision, MASD bilateral upper abdomen and Pelvis) ?  ?  ?  ?    ?     ?     ? ? ?Personal Care Assistance Level of Assistance  ?Bathing, Feeding, Dressing Bathing Assistance: Maximum assistance ?Feeding assistance: Maximum assistance ?Dressing Assistance: Maximum assistance ?   ? ?Functional Limitations Info  ?Sight, Hearing, Speech Sight Info: Impaired ?Hearing Info: Adequate ?Speech Info: Adequate  ? ? ?SPECIAL CARE FACTORS FREQUENCY  ?PT (By licensed PT), OT (By licensed OT)   ?  ?PT Frequency: 5x week ?OT Frequency: 5x week ?  ?  ?  ?   ? ? ?Contractures Contractures Info: Not present  ? ? ?Additional Factors Info  ?Code Status, Allergies, Insulin Sliding Scale Code Status Info: Full ?Allergies Info: Hydralazine ?  ?Insulin Sliding Scale Info: Insulin Aspart Novolog 0-5 U @ bedtime, 0-15 U 3x daily w/ meals; Insulin Aspart Protomere (Novolog 70/30) 2 x daily w/ meals ?  ?   ? ?Current Medications (02/20/2022):  This is the current hospital active medication list ?Current Facility-Administered Medications  ?Medication Dose Route Frequency Provider Last Rate Last Admin  ? 0.9 %  sodium chloride infusion  250 mL Intravenous PRN Bensimhon, 02/22/2022, MD      ? acetaminophen (TYLENOL) tablet 650 mg  650 mg Oral Q6H PRN Bensimhon, Bevelyn Buckles, MD   650 mg at 02/18/22 0932  ? albuterol (PROVENTIL) (2.5 MG/3ML) 0.083% nebulizer  solution 2.5 mg  2.5 mg Nebulization Q6H PRN Bensimhon, Bevelyn Buckles, MD      ? Chlorhexidine Gluconate Cloth 2 % PADS 6 each  6 each Topical Daily Bensimhon, Bevelyn Buckles, MD   6 each at 02/20/22 1000  ? diltiazem (CARDIZEM CD) 24 hr capsule 120 mg  120 mg Oral Daily Bensimhon, Bevelyn Buckles, MD   120 mg at 02/20/22 6283  ? furosemide (LASIX) 200 mg in dextrose 5 % 100 mL (2 mg/mL) infusion  8 mg/hr Intravenous  Continuous Armanda Magic R, MD 4 mL/hr at 02/20/22 1200 8 mg/hr at 02/20/22 1200  ? guaiFENesin (ROBITUSSIN) 100 MG/5ML liquid 5 mL  5 mL Oral Q4H PRN Bensimhon, Bevelyn Buckles, MD      ? hydrALAZINE (APRESOLINE) injection 10 mg  10 mg Intravenous Q4H PRN Bensimhon, Bevelyn Buckles, MD      ? HYDROcodone-acetaminophen (NORCO/VICODIN) 5-325 MG per tablet 1 tablet  1 tablet Oral Q8H PRN Bensimhon, Bevelyn Buckles, MD   1 tablet at 02/20/22 6629  ? insulin aspart (novoLOG) injection 0-15 Units  0-15 Units Subcutaneous TID WC Bensimhon, Bevelyn Buckles, MD   2 Units at 02/20/22 1209  ? insulin aspart (novoLOG) injection 0-5 Units  0-5 Units Subcutaneous QHS Bensimhon, Bevelyn Buckles, MD      ? insulin aspart protamine- aspart (NOVOLOG MIX 70/30) injection 20 Units  20 Units Subcutaneous BID WC Bensimhon, Bevelyn Buckles, MD   20 Units at 02/20/22 [phone]  ? isosorbide mononitrate (IMDUR) 24 hr tablet 30 mg  30 mg Oral Daily Bensimhon, Bevelyn Buckles, MD   30 mg at 02/20/22 4650  ? loratadine (CLARITIN) tablet 10 mg  10 mg Oral Daily Bensimhon, Bevelyn Buckles, MD   10 mg at 02/19/22 2252  ? metoprolol tartrate (LOPRESSOR) injection 5 mg  5 mg Intravenous Q4H PRN Bensimhon, Bevelyn Buckles, MD      ? multivitamin with minerals tablet 1 tablet  1 tablet Oral Daily Bensimhon, Bevelyn Buckles, MD   1 tablet at 02/20/22 3546  ? ondansetron (ZOFRAN) tablet 4 mg  4 mg Oral Q6H PRN Bensimhon, Bevelyn Buckles, MD      ? Or  ? ondansetron (ZOFRAN) injection 4 mg  4 mg Intravenous Q6H PRN Bensimhon, Bevelyn Buckles, MD      ? ondansetron (ZOFRAN) injection 4 mg  4 mg Intravenous Q6H PRN Bensimhon, Bevelyn Buckles, MD      ? rivaroxaban Carlena Hurl) tablet 20 mg  20 mg Oral Daily Bensimhon, Bevelyn Buckles, MD   20 mg at 02/19/22 1734  ? senna-docusate (Senokot-S) tablet 1 tablet  1 tablet Oral QHS PRN Bensimhon, Bevelyn Buckles, MD      ? sodium chloride flush (NS) 0.9 % injection 10-40 mL  10-40 mL Intracatheter Q12H Bensimhon, Bevelyn Buckles, MD   10 mL at 02/20/22 1000  ? sodium chloride flush (NS) 0.9 % injection 10-40 mL  10-40 mL  Intracatheter PRN Bensimhon, Bevelyn Buckles, MD      ? sodium chloride flush (NS) 0.9 % injection 3 mL  3 mL Intravenous Q12H Bensimhon, Bevelyn Buckles, MD   3 mL at 02/20/22 1000  ? sodium chloride flush (NS) 0.9 % injection 3 mL  3 mL Intravenous PRN Bensimhon, Bevelyn Buckles, MD      ? terazosin (HYTRIN) capsule 10 mg  10 mg Oral QHS Bensimhon, Bevelyn Buckles, MD   10 mg at 02/19/22 2251  ? ? ? ?Discharge Medications: ?Please see discharge summary for a list of discharge medications. ? ?Relevant Imaging  Results: ? ?Relevant Lab Results: ? ? ?Additional Information ?SS# [id number] ? ?Carley HammedJessica  Jovontae Banko, LCSWA ? ? ? ? ?

## 2022-02-20 NOTE — Progress Notes (Addendum)
?Progress Note ? ? ?Patient: [name] [id number] DOB: Jan 19, 1951 DOA: 02/13/2022     7 ?DOS: the patient was seen and examined on 02/20/2022 ?  ?Brief hospital course: ?[name] was admitted to the hospital with the working diagnosis of decompensated heart failure.  ? ?71 yo male with the past medical history of atrial fibrillation, hypertension, heart failure, T2DM and obesity who presented with dyspnea and weight gain. Reported progressive weight gain over 4 weeks, 25 lbs weight gain,  associated with lower extremity edema and dyspnea. On his initial physical examination his blood pressure was 129/50, HR 60, RR 20 and 02 saturation 80's on room air. Lungs with decreased breath sounds bilaterally, hearat with S1 and S2 present irregularly irregular, abdomen protuberant, positive lower extremity edema +++ bilaterally.  ? ?Na 138, K 4,7 Cl 105, bicarbonate 25, glucose 124 bun 29 cr 1,80 ?BNP 293  ?High sensitive troponin 9 and 9  ?Wbc 6,8 hgb 10 plt 235  ?Sars covid 19 negative  ? ?Chest radiograph with cardiomegaly and bilateral hilar vascular congestion.  ? ?EKG 65 bpm, right axis deviation, right bundle branch block, qtc 514, atrial fibrillation rhythm with no significant ST segment or T wave changes.  ? ?Patient was placed on furosemide for diuresis. ?Placed on antibiotic therapy for urinary tract infection.  ? ?PICC line was placed for further hemodynamic monitoring.  ? ?04/18 transferred to [facility] from [facility] for further cardiac workup ?Cardiac catheterization PCW 22, cardiac output 10,5 and index 3,8.  ?Placed on furosemide drip for further diuresis.  ? ?Assessment and Plan: ?* Acute on chronic diastolic CHF (congestive heart failure) (HCC) ?Patient with significant hypervolemia ? ?Echocardiogram with preserved LV systolic function 55 to 60% with moderate LVH, not well visualized RV, no significant valvular disease.  ? ?Urine output 1,100 ml over last 24 hrs ?Systolic blood pressure 119 to 133 mmHg range.   ? ?Plan to continue aggressive diuresis with furosemide drip.  ?Continue with isosorbide.  ? ?Acute hypoxemic respiratory failure due to cardiogenic pulmonary edema. ?02 saturation is 94% on [place]  ? ?Atrial fibrillation (HCC) ?Rate controlled atrial fibrillation with diltiazem. Continue anticoagulation with rivaroxaban.  ? ?Acute kidney injury superimposed on chronic kidney disease (HCC) ?CKD stage 3b. ? ?Renal function with serum cr at 1,74 K is 3,9 and serum bicarbonate at 30. Mg is 2,0  ?Plan to continue diuresis and follow up renal function in am. ? ? ?Hypertension, essential ?Close blood pressure monitoring, continue with diuresis for heart failure and diltiazem for atrial fibrillation rate control.  ? ?DM2 (diabetes mellitus, type 2) (HCC) ?Continue glucose cover and monitoring with insulin sliding scale.  ?On basal insulin 70/30 20 units bid.  ? ?Lymphedema of both lower extremities ?Patient with bilateral feet erythema, dorsal and medial aspect. ?He has completed IV ceftriaxone 5 days for a urinary tract infection (present on admission).  ? ?Plan to continue loca care with antifungal.  ?Hold on bacterial antibiotic therapy for now.  ?Continue local care/  ? ?Class 3 obesity (HCC) ?Calculated BMI is 56.3.  ? ? ? ? ?  ? ?Subjective: Patient with dyspnea and decreased mobility  ? ?Physical Exam: ?Vitals:  ? 02/20/22 0313 02/20/22 0320 02/20/22 0833 02/20/22 1510  ?BP: (!) 119/55  133/79 120/67  ?Pulse: 75  72 68  ?Resp: 17  18 20   ?Temp: 97.9 ?F (36.6 ?C)  98.4 ?F (36.9 ?C) 97.9 ?F (36.6 ?C)  ?TempSrc: Oral  Oral   ?SpO2: 95%   97%  ?  Weight:  (!) 178.7 kg    ?Height:      ? ?Neurology awake and alert ?ENT with mild pallor ?Cardiovascular with S1 and S2 present with no gallops or murmurs ?No JVD ?Positive lower extremity edema ++++ pitting bilaterally  ?Respiratory with rales but no wheezing ?Abdomen protuberant but not distended  ?Data Reviewed: ? ? ? ? ? ?Family Communication: no family at the bedside   ? ?Disposition: ?Status is: Inpatient ?Remains inpatient appropriate because: heart failure  ? Planned Discharge Destination: Home ? ?Author: ?Tawni Millers, MD ?02/20/2022 3:19 PM ? ?For on call review [email or web address].  ?

## 2022-02-20 NOTE — Assessment & Plan Note (Addendum)
Echocardiogram with preserved LV systolic function 55 to 123456 with moderate LVH, not well visualized RV, no significant valvular disease.  ? ?Urine output 3,425 ml over last 24 hrs ?Systolic blood pressure [id number] to 118 mmHg range.  ?Continue to have significant volume overload.  ?V 02 sat 52.7% ? ?Furosemide drip 10 mg per hr.  ?04/20 daily metolazone 2,5 mg  ? ?Continue isosorbide  ? ?Acute hypoxemic respiratory failure due to cardiogenic pulmonary edema. ?02 saturation is 92% on [place] 2 L.min  ?Today patient able to get into the bed and lay supine with head 30 degrees elevation with no dyspnea.  ?

## 2022-02-20 NOTE — Assessment & Plan Note (Addendum)
Hyperglycemia.  ? ?Fasting glucose 227 with capillary 271, 230 and 201.  ?Patient used 13 of insulin aspart yesterday. ? ?Plan to increase 70/30 insulin to 23 units and continue with insulin sliding scale.  ?Patient is tolerating po well.  ? ?

## 2022-02-20 NOTE — Assessment & Plan Note (Signed)
Calculated BMI is 56.3.  ?

## 2022-02-21 ENCOUNTER — Telehealth: Payer: Self-pay

## 2022-02-21 DIAGNOSIS — N189 Chronic kidney disease, unspecified: Secondary | ICD-10-CM

## 2022-02-21 DIAGNOSIS — N179 Acute kidney failure, unspecified: Secondary | ICD-10-CM | POA: Diagnosis not present

## 2022-02-21 DIAGNOSIS — I482 Chronic atrial fibrillation, unspecified: Secondary | ICD-10-CM | POA: Diagnosis not present

## 2022-02-21 DIAGNOSIS — I5033 Acute on chronic diastolic (congestive) heart failure: Secondary | ICD-10-CM | POA: Diagnosis not present

## 2022-02-21 DIAGNOSIS — I890 Lymphedema, not elsewhere classified: Secondary | ICD-10-CM | POA: Diagnosis not present

## 2022-02-21 DIAGNOSIS — E1142 Type 2 diabetes mellitus with diabetic polyneuropathy: Secondary | ICD-10-CM | POA: Diagnosis not present

## 2022-02-21 DIAGNOSIS — I10 Essential (primary) hypertension: Secondary | ICD-10-CM | POA: Diagnosis not present

## 2022-02-21 LAB — CBC
HCT: 31 % — ABNORMAL LOW (ref 39.0–52.0)
Hemoglobin: 9.5 g/dL — ABNORMAL LOW (ref 13.0–17.0)
MCH: 22.6 pg — ABNORMAL LOW (ref 26.0–34.0)
MCHC: 30.6 g/dL (ref 30.0–36.0)
MCV: 73.6 fL — ABNORMAL LOW (ref 80.0–100.0)
Platelets: 243 10*3/uL (ref 150–400)
RBC: 4.21 MIL/uL — ABNORMAL LOW (ref 4.22–5.81)
RDW: 18.5 % — ABNORMAL HIGH (ref 11.5–15.5)
WBC: 8.6 10*3/uL (ref 4.0–10.5)
nRBC: 0 % (ref 0.0–0.2)

## 2022-02-21 LAB — BASIC METABOLIC PANEL
Anion gap: 11 (ref 5–15)
BUN: 56 mg/dL — ABNORMAL HIGH (ref 8–23)
CO2: 29 mmol/L (ref 22–32)
Calcium: 8.6 mg/dL — ABNORMAL LOW (ref 8.9–10.3)
Chloride: 94 mmol/L — ABNORMAL LOW (ref 98–111)
Creatinine, Ser: 1.98 mg/dL — ABNORMAL HIGH (ref 0.61–1.24)
GFR, Estimated: 36 mL/min — ABNORMAL LOW (ref >60)
Glucose, Bld: 143 mg/dL — ABNORMAL HIGH (ref 70–99)
Potassium: 3.8 mmol/L (ref 3.5–5.1)
Sodium: 134 mmol/L — ABNORMAL LOW (ref 135–145)

## 2022-02-21 LAB — GLUCOSE, CAPILLARY
Glucose-Capillary: 137 mg/dL — ABNORMAL HIGH (ref 70–99)
Glucose-Capillary: 182 mg/dL — ABNORMAL HIGH (ref 70–99)
Glucose-Capillary: 185 mg/dL — ABNORMAL HIGH (ref 70–99)
Glucose-Capillary: 130 mg/dL — ABNORMAL HIGH (ref 70–99)

## 2022-02-21 LAB — COOXEMETRY PANEL
Carboxyhemoglobin: 2 % — ABNORMAL HIGH (ref 0.5–1.5)
Methemoglobin: 1 % (ref 0.0–1.5)
O2 Saturation: 56.1 %
Total hemoglobin: 9.9 g/dL — ABNORMAL LOW (ref 12.0–16.0)

## 2022-02-21 LAB — MAGNESIUM: Magnesium: 2.3 mg/dL (ref 1.7–2.4)

## 2022-02-21 MED ORDER — METOLAZONE 5 MG PO TABS
5.0000 mg | ORAL_TABLET | Freq: Once | ORAL | Status: AC
  Administered 2022-02-21: 5 mg via ORAL
  Filled 2022-02-21: qty 1

## 2022-02-21 NOTE — Telephone Encounter (Signed)
FYI ? ?Patient called he was admitted to hospital on 4/12, estimated duration time of discharge is possibly Monday or Tuesday 4/24 or 4/25.  He will go to Rehab after. ? ?He wasn't sure if Dr. Milinda Cave was aware of his hospital admission. ? ?No follow up call needed at this time. ? ?

## 2022-02-21 NOTE — Progress Notes (Signed)
Physical Therapy Treatment ?Patient Details ?Name: [name] ?MRN: [id number] ?DOB: Apr 18, 1951 ?Today's Date: 02/21/2022 ? ? ?History of Present Illness Patient is 71 y.o. male presented to the ER on 02/13/2022 with weight gain and hypoxia. Pt with acute on chronic chf and UTI. Transferred to [facility] on 4/17 and underwent heart cath on 4/18.PMH significant for A-fib, HFpEF, HTN, DMII, morbid obesity, venous stasis, lymphedema. ? ?  ?PT Comments  ? ? The pt was agreeable to session with goal of switching to a bariatric recliner for improved comfort. The pt continues to need +2-3 to power up to standing, but was able to complete with good functional use of momentum and BUE support. Additional people were useful for removing the recliner from behind him and replacing with bariatric recliner once he was in standing. The pt was able to maintain static stand with minG, but needed increased assist to steady while he attempted to reposition his feet. Poor wt shift and tolerance for upright activity at this time, will continue to benefit from skilled PT acutely to progress capacity for OOB mobility and gait as the pt has had recent and rapid decline in functional mobility (walking 2x40 ft with minG last week).  ?   ?Recommendations for follow up therapy are one component of a multi-disciplinary discharge planning process, led by the attending physician.  Recommendations may be updated based on patient status, additional functional criteria and insurance authorization. ? ?Follow Up Recommendations ? Skilled nursing-short term rehab (<3 hours/day) ?  ?  ?Assistance Recommended at Discharge Frequent or constant Supervision/Assistance  ?Patient can return home with the following Two people to help with walking and/or transfers;Two people to help with bathing/dressing/bathroom;Assist for transportation;Help with stairs or ramp for entrance;Assistance with cooking/housework ?  ?Equipment Recommendations ? None recommended by PT   ?  ?Recommendations for Other Services   ? ? ?  ?Precautions / Restrictions Precautions ?Precautions: Fall ?Restrictions ?Weight Bearing Restrictions: No  ?  ? ?Mobility ? Bed Mobility ?  ?  ?  ?  ?  ?  ?  ?General bed mobility comments: Pt in recliner ?  ? ?Transfers ?Overall transfer level: Needs assistance ?Equipment used: Rollator (4 wheels) ?Transfers: Sit to/from Stand ?Sit to Stand: Max assist, +2 physical assistance, +2 safety/equipment ?  ?  ?  ?  ?  ?General transfer comment: +2 to physically assist with stand, extra people to reposition new chair behind the pt while he is standing. assist with pad to rise up, pt with marked trunk flexion and unable to extend due to stiffness ?  ? ?Ambulation/Gait ?  ?  ?  ?  ?  ?  ?  ?General Gait Details: pt only able to reposition his feet underneath himself with significant effort and assist, no steps at this time ? ? ? ?  ?Balance Overall balance assessment: Needs assistance ?Sitting-balance support: Feet supported ?Sitting balance-Leahy Scale: Fair ?  ?  ?Standing balance support: Reliant on assistive device for balance, During functional activity, Bilateral upper extremity supported ?Standing balance-Leahy Scale: Poor ?Standing balance comment: minG in standing with BUE support on rollator, assist to complete transfer and to steady while pt attempting to reposition his feet ?  ?  ?  ?  ?  ?  ?  ?  ?  ?  ?  ?  ? ?  ?Cognition Arousal/Alertness: Awake/alert ?Behavior During Therapy: [facility] for tasks assessed/performed ?Overall Cognitive Status: Within Functional Limits for tasks assessed ?  ?  ?  ?  ?  ?  ?  ?  ?  ?  ?  ?  ?  ?  ?  ?  ?  ?  ?  ? ?  ?  Exercises   ? ?  ?General Comments General comments (skin integrity, edema, etc.): VSS on O2 ?  ?  ? ?Pertinent Vitals/Pain Pain Assessment ?Pain Assessment: Faces ?Faces Pain Scale: Hurts even more ?Pain Location: hips/legs with stance and trunk extension ?Pain Descriptors / Indicators: Grimacing, Guarding ?Pain  Intervention(s): Limited activity within patient's tolerance, Monitored during session, Repositioned  ? ? ? ?PT Goals (current goals can now be found in the care plan section) Acute Rehab PT Goals ?Patient Stated Goal: get rollator brakes fixed and get rid of fluid ?PT Goal Formulation: With patient ?Time For Goal Achievement: 03/06/22 ?Potential to Achieve Goals: Fair ?Progress towards PT goals: Progressing toward goals ? ?  ?Frequency ? ? ? Min 3X/week ? ? ? ?  ?PT Plan Current plan remains appropriate  ? ? ?   ?AM-PAC PT "6 Clicks" Mobility   ?Outcome Measure ? Help needed turning from your back to your side while in a flat bed without using bedrails?: Total ?Help needed moving from lying on your back to sitting on the side of a flat bed without using bedrails?: Total ?Help needed moving to and from a bed to a chair (including a wheelchair)?: Total ?Help needed standing up from a chair using your arms (e.g., wheelchair or bedside chair)?: Total ?Help needed to walk in hospital room?: Total ?Help needed climbing 3-5 steps with a railing? : Total ?6 Click Score: 6 ? ?  ?End of Session Equipment Utilized During Treatment: Oxygen;Gait belt ?Activity Tolerance: Patient limited by fatigue ?Patient left: with call bell/phone within reach;in chair;with nursing/sitter in room ?Nurse Communication: Mobility status ?PT Visit Diagnosis: Unsteadiness on feet (R26.81);Muscle weakness (generalized) (M62.81);Difficulty in walking, not elsewhere classified (R26.2) ?  ? ? ?Time: [id number] ?PT Time Calculation (min) (ACUTE ONLY): 17 min ? ?Charges:  $Therapeutic Exercise: 8-22 mins          ?          ? ?West Carbo, PT, DPT  ? ?Acute Rehabilitation Department ?Pager #: [phone] ? ? ?Sandra Cockayne ?02/21/2022, 2:04 PM ? ?

## 2022-02-21 NOTE — Progress Notes (Signed)
?Progress Note ? ? ?Patient: [name] [id number] DOB: [birth year] DOA: 02/13/2022     8 ?DOS: the patient was seen and examined on 02/21/2022 ?  ?Brief hospital course: ?Mr. Schwimmer was admitted to the hospital with the working diagnosis of decompensated heart failure.  ? ?71 yo male with the past medical history of atrial fibrillation, hypertension, heart failure, T2DM and obesity who presented with dyspnea and weight gain. Reported progressive weight gain over 4 weeks, 25 lbs weight gain,  associated with lower extremity edema and dyspnea. On his initial physical examination his blood pressure was 129/50, HR 60, RR 20 and 02 saturation 80's on room air. Lungs with decreased breath sounds bilaterally, hearat with S1 and S2 present irregularly irregular, abdomen protuberant, positive lower extremity edema +++ bilaterally.  ? ?Na 138, K 4,7 Cl 105, bicarbonate 25, glucose 124 bun 29 cr 1,80 ?BNP 293  ?High sensitive troponin 9 and 9  ?Wbc 6,8 hgb 10 plt 235  ?Sars covid 19 negative  ? ?Chest radiograph with cardiomegaly and bilateral hilar vascular congestion.  ? ?EKG 65 bpm, right axis deviation, right bundle branch block, qtc 514, atrial fibrillation rhythm with no significant ST segment or T wave changes.  ? ?Patient was placed on furosemide for diuresis. ?Placed on antibiotic therapy for urinary tract infection.  ? ?PICC line was placed for further hemodynamic monitoring.  ? ?04/18 transferred to [facility] from [facility] for further cardiac workup ?Cardiac catheterization PCW 22, cardiac output 10,5 and index 3,8.  ?Placed on furosemide drip for further diuresis.  ? ?Assessment and Plan: ?* Acute on chronic diastolic CHF (congestive heart failure) ([place]) ?Patient with significant hypervolemia ? ?Echocardiogram with preserved LV systolic function 55 to 123456 with moderate LVH, not well visualized RV, no significant valvular disease.  ? ?Urine output 1,700 ml over last 24 hrs ?Systolic blood pressure 123456 to 133 mmHg range.   ? ?Continue aggressive diuresis with furosemide and metolazone. ? ?Acute hypoxemic respiratory failure due to cardiogenic pulmonary edema. ?02 saturation is 94% on [place]  ? ?Atrial fibrillation ([place]) ?Rate controlled atrial fibrillation with diltiazem. Continue anticoagulation with rivaroxaban.  ? ?Acute kidney injury superimposed on chronic kidney disease ([place]) ?CKD stage 3b. Hyponatremia. Hypokalemia ? ?Renal function with serum cr at 1,98, K is 3,8 and serum bicarbonate at 29. Na 134. ?Patient continue with volume overload. ? ?Continue diuresis with furosemide drip 5 mg per hr, one dose of metolazone today. ?Follow up renal function in am, avoid hypotension.  ? ?Hypertension, essential ?Blood pressure has been stable, continue diuresis with furosemide and one dose of metolazone today. ?Continue close blood pressure monitoring.  ? ?DM2 (diabetes mellitus, type 2) ([place]) ?Continue glucose cover and monitoring with insulin sliding scale.  ?On basal insulin 70/30 20 units bid.  ?Patient is tolerating po well his fasting glucose this am 143.  ? ?Lymphedema of both lower extremities ?Patient with bilateral feet erythema, dorsal and medial aspect. ?He has completed IV ceftriaxone 5 days for a urinary tract infection (present on admission).  ? ?Plan to continue local care with antifungal.  ?Hold on bacterial antibiotic therapy for now.  ? ? ?Class 3 obesity (HCC) ?Calculated BMI is 56.3.  ? ? ? ? ?  ? ?Subjective: Patient continue to have dyspnea and edema, not able to sleep at night  ? ?Physical Exam: ?Vitals:  ? 02/21/22 0631 02/21/22 0836 02/21/22 1118 02/21/22 1530  ?BP: 129/71 131/75 124/65 121/72  ?Pulse: 68 79 66 79  ?Resp: 20 19 (!)  21 (!) 23  ?Temp: 97.7 ?F (36.5 ?C) 97.9 ?F (36.6 ?C) 97.9 ?F (36.6 ?C) 98 ?F (36.7 ?C)  ?TempSrc: Oral Oral Oral Oral  ?SpO2: 99% 98% 100% 98%  ?Weight:      ?Height:      ? ?Neurology awake and alert ?ENT with mild pallor ?Cardiovascular with S1 and S2 present with positive S2 gallop,  no murmurs. ?No JVD ?Positive lower extremity edema ++++ up to the thighs ?Respiratory with bilateral rales but not wheezing ?Abdomen protuberant but not tender ?Skin rash feet, with dressing in place.  ?Data Reviewed: ? ? ? ?Family Communication: no family at the bedside  ? ?Disposition: ?Status is: Inpatient ?Remains inpatient appropriate because: heart failure   ? Planned Discharge Destination: Home ? ?Author: ?Tawni Millers, MD ?02/21/2022 3:34 PM ? ?For on call review [email or web address].  ?

## 2022-02-21 NOTE — Telephone Encounter (Signed)
Pt admitted for CHF. FYI. Please see below. ? ?

## 2022-02-21 NOTE — Progress Notes (Signed)
? ?Progress Note ? ?Patient Name: [name] ?Date of Encounter: 02/21/2022 ? ?[facility] Cardiologist: Pixie Casino, MD  ? ?Subjective  ? ?Diuresed very well yesterday on IV Lasix drip and put out 1.7 L and is net -2 L since admission.  Weight has not been done today ?Inpatient Medications  ?  ?Scheduled Meds: ? Chlorhexidine Gluconate Cloth  6 each Topical Daily  ? diltiazem  120 mg Oral Daily  ? insulin aspart  0-15 Units Subcutaneous TID WC  ? insulin aspart  0-5 Units Subcutaneous QHS  ? insulin aspart protamine- aspart  20 Units Subcutaneous BID WC  ? isosorbide mononitrate  30 mg Oral Daily  ? loratadine  10 mg Oral Daily  ? multivitamin with minerals  1 tablet Oral Daily  ? nystatin cream   Topical BID  ? rivaroxaban  20 mg Oral Daily  ? sodium chloride flush  10-40 mL Intracatheter Q12H  ? sodium chloride flush  3 mL Intravenous Q12H  ? terazosin  10 mg Oral QHS  ? ?Continuous Infusions: ? sodium chloride    ? furosemide (LASIX) 200 mg in dextrose 5% 100 mL (2mg /mL) infusion 8 mg/hr (02/20/22 1200)  ? ?PRN Meds: ?sodium chloride, acetaminophen **OR** [DISCONTINUED] acetaminophen, albuterol, guaiFENesin, HYDROcodone-acetaminophen, metoprolol tartrate, ondansetron **OR** ondansetron (ZOFRAN) IV, ondansetron (ZOFRAN) IV, senna-docusate, sodium chloride flush, sodium chloride flush  ? ?Vital Signs  ?  ?Vitals:  ? 02/20/22 1510 02/20/22 2053 02/21/22 0009 02/21/22 0631  ?BP: 120/67 134/64 139/78 129/71  ?Pulse: 68 89 70 68  ?Resp: 20 13 19 20   ?Temp: 97.9 ?F (36.6 ?C) 98.1 ?F (36.7 ?C) 97.8 ?F (36.6 ?C) 97.7 ?F (36.5 ?C)  ?TempSrc:  Oral Oral Oral  ?SpO2: 97% 97% 97% 99%  ?Weight:      ?Height:      ? ? ?Intake/Output Summary (Last 24 hours) at 02/21/2022 0800 ?Last data filed at 02/21/2022 0747 ?Gross per 24 hour  ?Intake 548.68 ml  ?Output 1700 ml  ?Net -1151.32 ml  ? ? ? ?  02/20/2022  ?  3:20 AM 02/19/2022  ?  6:05 AM 02/18/2022  ?  5:26 AM  ?Last 3 Weights  ?Weight (lbs) 394 lb 393 lb 9.6 oz 410  lb 4.4 oz  ?Weight (kg) 178.717 kg 178.536 kg 186.1 kg  ?   ? ?Telemetry  ?  ?Atrial fibrillation with controlled ventricular response- Personally Reviewed ? ?ECG  ?  ?No new tracings - Personally Reviewed ? ?Physical Exam  ? ?GEN: Morbidly obese ?HEENT: Normal ?NECK: No JVD; No carotid bruits ?LYMPHATICS: No lymphadenopathy ?CARDIAC: Irregular irregular, no murmurs, rubs, gallops ?RESPIRATORY:  Clear to auscultation without rales, wheezing or rhonchi  ?ABDOMEN: Soft, non-tender, non-distended ?MUSCULOSKELETAL: Marked bilateral lower extremity edema; No deformity  ?SKIN: Warm and dry ?NEUROLOGIC:  Alert and oriented x 3 ?PSYCHIATRIC:  Normal affect   ?Labs  ?  ?High Sensitivity Troponin:   ?Recent Labs  ?Lab 02/13/22 ?1600 02/13/22 ?1800  ?TROPONINIHS 9 9  ? ?   ?Chemistry ?Recent Labs  ?Lab 02/19/22 ?0426 02/19/22 ?0903 02/19/22 ?0907 02/20/22 ?[id number] 02/21/22 ?[id number]  ?NA 134*   < > 143 135 134*  ?K 3.6   < > 3.0* 3.8 3.8  ?CL 92*  --   --  96* 94*  ?CO2 30  --   --  31 29  ?GLUCOSE 121*  --   --  139* 143*  ?BUN 51*  --   --  50* 56*  ?CREATININE 2.22*  --   --  1.89* 1.98*  ?CALCIUM 8.3*  --   --  8.3* 8.6*  ?MG 2.1  --   --  2.3 2.3  ?PROT  --   --   --  6.4*  --   ?ALBUMIN  --   --   --  2.9*  --   ?AST  --   --   --  17  --   ?ALT  --   --   --  17  --   ?ALKPHOS  --   --   --  33  --   ?BILITOT  --   --   --  1.1  --   ?GFRNONAA 31*  --   --  38* 36*  ?ANIONGAP 12  --   --  8 11  ? < > = values in this interval not displayed.  ? ?  ?Lipids No results for input(s): CHOL, TRIG, HDL, LABVLDL, LDLCALC, CHOLHDL in the last 168 hours.  ?Hematology ?Recent Labs  ?Lab 02/19/22 ?0426 02/19/22 ?0903 02/19/22 ?0907 02/20/22 ?[id number] 02/21/22 ?[id number]  ?WBC 7.3  --   --  7.5 8.6  ?RBC 4.06*  --   --  4.01* 4.21*  ?HGB 9.2*   < > 9.5* 9.1* 9.5*  ?HCT 29.7*   < > 28.0* 29.2* 31.0*  ?MCV 73.2*  --   --  72.8* 73.6*  ?MCH 22.7*  --   --  22.7* 22.6*  ?MCHC 31.0  --   --  31.2 30.6  ?RDW 18.4*  --   --  18.5* 18.5*  ?PLT 206  --   --   210 243  ? < > = values in this interval not displayed.  ? ? ?Thyroid No results for input(s): TSH, FREET4 in the last 168 hours.  ?BNP ?No results for input(s): BNP, PROBNP in the last 168 hours. ?  ?DDimer No results for input(s): DDIMER in the last 168 hours.  ? ?Radiology  ?  ?CARDIAC CATHETERIZATION ? ?Result Date: 02/19/2022 ?Findings: RA = 18 RV = 60/24 PA =  63/19 (35) PCW = 22 Fick cardiac output/index = 10.5/3.8 Thermo CO/CI = 7.2/2.6 PVR = 1.9 WU (Thermo) FA sat = 92% PA sat = 63%, 65% High SVC = 68% PAPi = 2.4 Assessment: 1. Marked volume overload with R>>L heart failure 2. Preserved cardiac output Plan/Discussion: Remains markedly volume overloaded. Consider use of lasix gtt +/- acetazolamide or metolazone to facilitate diuresis. Keep LEs wrapped. Glori Bickers, MD 9:20 AM  ? ?Cardiac Studies  ? ?Echocardiogram 02/14/22  ? 1. Left ventricular ejection fraction, by estimation, is 55 to 60%. The  ?left ventricle has normal function. The left ventricle has no regional  ?wall motion abnormalities. There is moderate left ventricular hypertrophy.  ?Left ventricular diastolic function  ? could not be evaluated.  ? 2. Right ventricular systolic function was not well visualized. The right  ?ventricular size is not well visualized.  ? 3. Left atrial size was mildly dilated.  ? 4. The mitral valve is normal in structure. No evidence of mitral valve  ?regurgitation.  ? 5. The aortic valve is normal in structure. There is mild calcification  ?of the aortic valve. Aortic valve regurgitation is trivial.  ? ?Patient Profile  ?   ?71 y.o. male with a hx of chronic atrial fibrillatin, chronic combined CHF, HTN, DM type 2, CKD stage 3a, RLS, OSA who is being seen 02/14/2022 for the evaluation of CHF at the request of Dr. Reesa Chew. ? ?  Assessment & Plan  ?  ?Acute hypoxic respiratory failure ?Acute on chronic diastolic heart failure ?Right heart failure ?- echo  this admission with hyperdynamic EF ?- CXR with pulmonary vascular  congestion, hazy bilateral opacities that may represent mild edema ?- BNP 294, but in the setting of morbid obesity ?- output not complete - pt was going to the bathroom without measuring ?- right heart moderately enlarged and fx moderately reduced ?- COOX down to 56% and CVP up to 19 ?-Right heart cath this admission showed marked volume overload right greater than left with cardiac output 7.2, cardiac index 2.6, PCW 22, PA P 63/19 with a mean of 35 mmHg ?-wound care in yesterday with patient and legs unwrapped -  he has massive LE edema into his feet with skin excoriations and probable cellulitis ?-Started on Lasix drip yesterday at 8 mg/h and put out 1.7 L and is net -2 L since admission ?-Weight has not been done today ?-Serum creatinine slightly bumped this morning from 1.89->>1.98 and potassium 3.8 ?-continue imdur ?-increase Lasix to 10mg /hr given increased CVP and Co-ox trending downward and give Metolazone 5mg  again today ?-ARB on hold for AKI ?-Continue to follow renal function, strict I's and O's and daily weights ? ?AoCKD 3 ?-Serum creatinine slightly bumped from 1.89-1.98 today after starting Lasix drip ?-Still intravascularly volume overloaded by right heart cath this admission ?-Continue Lasix drip and as needed metolazone ? ?Chronic Afib ?-Heart rate remains controlled ?-Continue Cardizem CD 120 mg daily  ?-Continue Xarelto ? ?Bilateral LE cellulitis ?-TRH aware and starting antibx. ? ?Disposition ?Unclear - living in a motel PTA ?   ?I have spent a total of 35 minutes with patient reviewing cardiac cath, telemetry, EKGs, labs and examining patient as well as establishing an assessment and plan that was discussed with the patient.  > 50% of time was spent in direct patient care.    ? ?For questions or updates, please contact [place] ?Please consult [email or web address] for contact info under  ? ?  ?   ?Signed, ?Fransico Him, MD  ?02/21/2022, 8:00 AM   ? ?

## 2022-02-21 NOTE — Evaluation (Signed)
Occupational Therapy Evaluation ?Patient Details ?Name: [name] ?MRN: [id number] ?DOB: 03/12/1951 ?Today's Date: 02/21/2022 ? ? ?History of Present Illness Patient is 71 y.o. male presented to the ER on 02/13/2022 with weight gain and hypoxia. Pt with acute on chronic chf and UTI. Transferred to [facility] on 4/17 and underwent heart cath on 4/18.PMH significant for A-fib, HFpEF, HTN, DMII, morbid obesity, venous stasis, lymphedema.  ? ?Clinical Impression ?  ?PTA, pt lives in [place], typically Modified Independent with ADLs, IADLs and mobility using Rollator. Per chart, pt able to mobilize initially when presented to [facility]. However, pt with significant decline in functional abilities. Pt unable to successfully stand with Max x 2 when trialing Stedy vs bari Rollator today. Noted pt able to stand yesterday when >+3 physical assist provided. Pt requires up to Mod A for UB ADL and Total A x 3 for LB ADLs if attempted in standing. Extended time spent educating and collaborating with pt/staff on need for bariatric recliner for pt, as well as bed options. If pt unable to progress standing/transfers in the next few days, will need to consider bariatric tilt bed. As pt is well below baseline, recommend SNF rehab at [place].  ?   ? ?Recommendations for follow up therapy are one component of a multi-disciplinary discharge planning process, led by the attending physician.  Recommendations may be updated based on patient status, additional functional criteria and insurance authorization.  ? ?Follow Up Recommendations ? Skilled nursing-short term rehab (<3 hours/day)  ?  ?Assistance Recommended at Discharge Frequent or constant Supervision/Assistance  ?Patient can return home with the following Two people to help with walking and/or transfers;Two people to help with bathing/dressing/bathroom ? ?  ?Functional Status Assessment ? Patient has had a recent decline in their functional status and demonstrates the ability to make significant  improvements in function in a reasonable and predictable amount of time.  ?Equipment Recommendations ? Other (comment) (TBD pending progress)  ?  ?Recommendations for Other Services   ? ? ?  ?Precautions / Restrictions Precautions ?Precautions: Fall ?Restrictions ?Weight Bearing Restrictions: No  ? ?  ? ?Mobility Bed Mobility ?  ?  ?  ?  ?  ?  ?  ?General bed mobility comments: Pt in recliner ?  ? ?Transfers ?Overall transfer level: Needs assistance ?Equipment used: Rolling walker (2 wheels), Ambulation equipment used ?Transfers: Sit to/from Stand ?  ?  ?  ?  ?  ?  ?General transfer comment: attempting sit to stand trials with Stedy (foot plate too narrow so tried with Stedy backwards), as well as Rollator with pt unable to successfully stand with +2 assist ?  ? ?  ?Balance Overall balance assessment: Needs assistance ?Sitting-balance support: Feet supported ?Sitting balance-Leahy Scale: Fair ?  ?  ?  ?  ?  ?  ?  ?  ?  ?  ?  ?  ?  ?  ?  ?  ?   ? ?ADL either performed or assessed with clinical judgement  ? ?ADL Overall ADL's : Needs assistance/impaired ?Eating/Feeding: Independent;Sitting ?  ?Grooming: Set up;Sitting ?  ?Upper Body Bathing: Moderate assistance;Sitting ?  ?Lower Body Bathing: Maximal assistance;+2 for physical assistance;+2 for safety/equipment;Sitting/lateral leans;Sit to/from stand ?  ?Upper Body Dressing : Minimal assistance;Sitting ?  ?Lower Body Dressing: Total assistance;+2 for physical assistance;+2 for safety/equipment;Sitting/lateral leans;Sit to/from stand ?  ?  ?  ?Toileting- Clothing Manipulation and Hygiene: Total assistance;+2 for physical assistance;+2 for safety/equipment;Sitting/lateral lean;Sit to/from stand ?  ?  ?  ?  ?  General ADL Comments: Per staff yesterday, pt requires >3-5 person assist for standing to switch BSC/recliner from behind pt. Unable to stand with +2 assist today, limited by fluid overload in legs, lower seating surface and weakness from decreased mobility in past  week.  ? ? ? ?Vision Baseline Vision/History: 1 Wears glasses ?Ability to See in Adequate Light: 0 Adequate ?Patient Visual Report: No change from baseline ?Vision Assessment?: No apparent visual deficits  ?   ?Perception   ?  ?Praxis   ?  ? ?Pertinent Vitals/Pain Pain Assessment ?Pain Assessment: Faces ?Faces Pain Scale: Hurts a little bit ?Pain Location: scrotum with scooting ?Pain Descriptors / Indicators: Grimacing, Guarding ?Pain Intervention(s): Monitored during session, Repositioned  ? ? ? ?Hand Dominance Right ?  ?Extremity/Trunk Assessment Upper Extremity Assessment ?Upper Extremity Assessment: LUE deficits/detail ?LUE Deficits / Details: reports feeling like [facility] L [facility] ?  ?Lower Extremity Assessment ?Lower Extremity Assessment: Defer to PT evaluation ?  ?Cervical / Trunk Assessment ?Cervical / Trunk Assessment: Other exceptions ?Cervical / Trunk Exceptions: increased body habitus ?  ?Communication Communication ?Communication: No difficulties ?  ?Cognition Arousal/Alertness: Awake/alert ?Behavior [facility]: [facility] for tasks assessed/performed ?Overall Cognitive Status: Within Functional Limits for tasks assessed ?  ?  ?  ?  ?  ?  ?  ?  ?  ?  ?  ?  ?  ?  ?  ?  ?  ?  ?  ?General Comments  HR 70s. Noted pt in recliner recliner and rehab tech observed indentations in pt's thighs. Notified NT and nursing secretary of need for bariatric recliner  though no available one on unit. Continued collab needed for appropriate recliner for pt. Educated pt on possibility of tilt bed if ability to stand does not improve ([facility] with pt open to all suggestions ? ?  ?Exercises   ?  ?Shoulder Instructions    ? ? ?Home Living Family/patient expects to be discharged to:: Other (Comment) (motel) ?Living Arrangements: Alone ?  ?Type of Home: Other(Comment) (motel) ?Home Access: Level entry ?  ?  ?Home Layout: One level ?  ?  ?Bathroom Shower/Tub:  Tub/shower unit ?  ?Bathroom Toilet: Handicapped height ?Bathroom Accessibility: Yes ?  ?Home Equipment: Rollator (4 wheels);Hand held shower head;Grab bars - tub/shower;Grab bars - toilet;Cane - single point ?  ?Additional Comments: pt is staying at a motel currently, lost his apartment ~ 6 months ago. He is waiting for an apartment to open in [place]. At apartment, he slept in lift chair but at motel, has been sleeping in regular bed ?  ? ?  ?Prior Functioning/Environment Prior Level of Function : Independent/Modified Independent ?  ?  ?  ?  ?  ?  ?Mobility Comments: pt is ambulating limited distance with rollator, uses seat for breaks. pt is still driving, doing food shopping with use of scooter. recently was using SPC ?ADLs Comments: pt reports mod indepdence with ADL's, he puts shower boots on and uses grab bar and handheld shower to step into tub and wash off - standing for duration of showers ?  ? ?  ?  ?OT Problem List: Decreased strength;Decreased activity tolerance;Impaired balance (sitting and/or standing);Cardiopulmonary status limiting activity;Obesity;Increased edema ?  ?   ?OT Treatment/Interventions: Self-care/ADL training;Therapeutic exercise;Energy conservation;DME and/or AE instruction;Therapeutic activities;Patient/family education;Balance training  ?  ?OT Goals(Current goals can be found in the care plan section) Acute Rehab OT Goals ?Patient Stated  Goal: regain independence ?OT Goal Formulation: With patient ?Time For Goal Achievement: 03/07/22 ?Potential to Achieve Goals: Good  ?OT Frequency: Min 2X/week ?  ? ?Co-evaluation   ?  ?  ?  ?  ? ?  ?AM-PAC OT "6 Clicks" Daily Activity     ?Outcome Measure Help from another person eating meals?: None ?Help from another person taking care of personal grooming?: A Little ?Help from another person toileting, which includes using toliet, bedpan, or urinal?: Total ?Help from another person bathing (including washing, rinsing, drying)?: A Lot ?Help from  another person to put on and taking off regular upper body clothing?: A Little ?Help from another person to put on and taking off regular lower body clothing?: Total ?6 Click Score: 14 ?  ?End of Session Eq

## 2022-02-22 DIAGNOSIS — E1142 Type 2 diabetes mellitus with diabetic polyneuropathy: Secondary | ICD-10-CM | POA: Diagnosis not present

## 2022-02-22 DIAGNOSIS — I890 Lymphedema, not elsewhere classified: Secondary | ICD-10-CM | POA: Diagnosis not present

## 2022-02-22 DIAGNOSIS — I10 Essential (primary) hypertension: Secondary | ICD-10-CM | POA: Diagnosis not present

## 2022-02-22 DIAGNOSIS — I5033 Acute on chronic diastolic (congestive) heart failure: Secondary | ICD-10-CM | POA: Diagnosis not present

## 2022-02-22 DIAGNOSIS — N179 Acute kidney failure, unspecified: Secondary | ICD-10-CM | POA: Diagnosis not present

## 2022-02-22 DIAGNOSIS — I482 Chronic atrial fibrillation, unspecified: Secondary | ICD-10-CM | POA: Diagnosis not present

## 2022-02-22 LAB — BASIC METABOLIC PANEL
Anion gap: 8 (ref 5–15)
BUN: 61 mg/dL — ABNORMAL HIGH (ref 8–23)
CO2: 32 mmol/L (ref 22–32)
Calcium: 8.8 mg/dL — ABNORMAL LOW (ref 8.9–10.3)
Chloride: 95 mmol/L — ABNORMAL LOW (ref 98–111)
Creatinine, Ser: 1.9 mg/dL — ABNORMAL HIGH (ref 0.61–1.24)
GFR, Estimated: 37 mL/min — ABNORMAL LOW (ref >60)
Glucose, Bld: 162 mg/dL — ABNORMAL HIGH (ref 70–99)
Potassium: 3.8 mmol/L (ref 3.5–5.1)
Sodium: 135 mmol/L (ref 135–145)

## 2022-02-22 LAB — COOXEMETRY PANEL
Carboxyhemoglobin: 1.8 % — ABNORMAL HIGH (ref 0.5–1.5)
Methemoglobin: <0.7 % (ref 0.0–1.5)
O2 Saturation: 69.1 %
Total hemoglobin: 9.6 g/dL — ABNORMAL LOW (ref 12.0–16.0)

## 2022-02-22 LAB — GLUCOSE, CAPILLARY
Glucose-Capillary: 161 mg/dL — ABNORMAL HIGH (ref 70–99)
Glucose-Capillary: 142 mg/dL — ABNORMAL HIGH (ref 70–99)
Glucose-Capillary: 182 mg/dL — ABNORMAL HIGH (ref 70–99)
Glucose-Capillary: 183 mg/dL — ABNORMAL HIGH (ref 70–99)

## 2022-02-22 LAB — MAGNESIUM: Magnesium: 2.3 mg/dL (ref 1.7–2.4)

## 2022-02-22 MED ORDER — METOLAZONE 2.5 MG PO TABS
2.5000 mg | ORAL_TABLET | Freq: Once | ORAL | Status: AC
  Administered 2022-02-22: 2.5 mg via ORAL
  Filled 2022-02-22: qty 1

## 2022-02-22 MED ORDER — DICLOFENAC SODIUM 1 % EX GEL
2.0000 g | Freq: Four times a day (QID) | CUTANEOUS | Status: DC | PRN
  Administered 2022-02-22 – 2022-03-06 (×7): 2 g via TOPICAL
  Filled 2022-02-22: qty 100

## 2022-02-22 NOTE — Progress Notes (Signed)
?Progress Note ? ? ?Patient: [name] [id number] DOB: 20-Apr-1951 DOA: 02/13/2022     9 ?DOS: the patient was seen and examined on 02/22/2022 ?  ?Brief hospital course: ?Mr. Heitman was admitted to the hospital with the working diagnosis of decompensated heart failure.  ? ?71 yo male with the past medical history of atrial fibrillation, hypertension, heart failure, T2DM and obesity who presented with dyspnea and weight gain. Reported progressive weight gain over 4 weeks, 25 lbs weight gain,  associated with lower extremity edema and dyspnea. On his initial physical examination his blood pressure was 129/50, HR 60, RR 20 and 02 saturation 80's on room air. Lungs with decreased breath sounds bilaterally, hearat with S1 and S2 present irregularly irregular, abdomen protuberant, positive lower extremity edema +++ bilaterally.  ? ?Na 138, K 4,7 Cl 105, bicarbonate 25, glucose 124 bun 29 cr 1,80 ?BNP 293  ?High sensitive troponin 9 and 9  ?Wbc 6,8 hgb 10 plt 235  ?Sars covid 19 negative  ? ?Chest radiograph with cardiomegaly and bilateral hilar vascular congestion.  ? ?EKG 65 bpm, right axis deviation, right bundle branch block, qtc 514, atrial fibrillation rhythm with no significant ST segment or T wave changes.  ? ?Patient was placed on furosemide for diuresis. ?Placed on antibiotic therapy for urinary tract infection.  ? ?PICC line was placed for further hemodynamic monitoring.  ? ?04/18 transferred to [facility] from [facility] for further cardiac workup ?Cardiac catheterization PCW 22, cardiac output 10,5 and index 3,8.  ?Placed on furosemide drip for further diuresis.  ? ?Assessment and Plan: ?* Acute on chronic diastolic CHF (congestive heart failure) ([place]) ?Echocardiogram with preserved LV systolic function 55 to 123456 with moderate LVH, not well visualized RV, no significant valvular disease.  ? ?Urine output 2,500 ml over last 24 hrs ?Systolic blood pressure 0000000 to 126 mmHg range.  ?Patient has severe orthopnea and PND,  he has been sleeping in the recliner, not tolerating staying in bed.  ? ?Had metolazone 04/20, 04/21   ?Continue aggressive diuresis with furosemide  ?Continue isosorbide  ? ?Acute hypoxemic respiratory failure due to cardiogenic pulmonary edema. ?02 saturation is 94% on [place]  ? ?Atrial fibrillation ([place]) ?Continue rate control with diltiazem and anticoagulation with rivaroxaban.  ? ?Acute kidney injury superimposed on chronic kidney disease ([place]) ?CKD stage 3b. Hyponatremia. Hypokalemia ? ?Serum cr today is 1,9 0 with k at 3,8 and serum bicarbonate at 32.  ? ? ?Responding well to diuresis with furosemide drip 10 mg per hr ?Had metolazone 04.20 and 04.21 ?Continue close follow up on renal function and electrolytes.  ? ?Hypertension, essential ?Continue close blood pressure monitoring. ?On furosemide for diuresis.  ? ?DM2 (diabetes mellitus, type 2) ([place]) ?Continue glucose cover and monitoring with insulin sliding scale.  ?On basal insulin 70/30 20 units bid.  ?Fasting glucose is 162 today.  ? ?Lymphedema of both lower extremities ?Patient with bilateral feet erythema, dorsal and medial aspect. ?He has completed IV ceftriaxone 5 days for a urinary tract infection (present on admission).  ? ?Plan to continue local care with antifungal.  ?Hold on bacterial antibiotic therapy for now.  ? ? ?Class 3 obesity (HCC) ?Calculated BMI is 56.3.  ? ? ? ? ?  ? ?Subjective: Patient continue to have edema lower extremities and orthopnea, has been sleeping in the recliner. No chest pain and tolerating po well.  ? ?Physical Exam: ?Vitals:  ? 02/22/22 0036 02/22/22 0317 02/22/22 0823 02/22/22 1018  ?BP:  131/66 126/78  109/66  ?Pulse:  75  64  ?Resp:  18 19 18   ?Temp:  98 ?F (36.7 ?C)  97.6 ?F (36.4 ?C)  ?TempSrc:  Oral  Oral  ?SpO2:  99%  98%  ?Weight: (!) 177.8 kg     ?Height:      ? ?Neurology awake and alert ?ENT with no pallor ?Cardiovascular with S1 and S2 present, irregularly irregular with no gallops or murmurs no rubs ?No  JVD ?Positive pitting lower extremity edema +++ ?Respiratory with no rales or rhonchi, and no wheezing ?Abdomen protuberant but not distended ?Feet rash with dressing in place,.  ?Data Reviewed: ? ? ? ?Family Communication: no family at the bedside  ? ?Disposition: ?Status is: Inpatient ?Remains inpatient appropriate because: heart failure  ? Planned Discharge Destination: Skilled nursing facility ? ? ?Author: ?Tawni Millers, MD ?02/22/2022 3:51 PM ? ?For on call review [email or web address].  ?

## 2022-02-22 NOTE — Progress Notes (Signed)
? ?Progress Note ? ?Patient Name: [name] ?Date of Encounter: 02/22/2022 ? ?[facility] Cardiologist: Pixie Casino, MD  ? ?Subjective  ? ?Denies any chest pain.  SOB improved.  Continues to diurese well on Lasix gtt ?Inpatient Medications  ?  ?Scheduled Meds: ? Chlorhexidine Gluconate Cloth  6 each Topical Daily  ? diltiazem  120 mg Oral Daily  ? insulin aspart  0-15 Units Subcutaneous TID WC  ? insulin aspart  0-5 Units Subcutaneous QHS  ? insulin aspart protamine- aspart  20 Units Subcutaneous BID WC  ? isosorbide mononitrate  30 mg Oral Daily  ? loratadine  10 mg Oral Daily  ? multivitamin with minerals  1 tablet Oral Daily  ? nystatin cream   Topical BID  ? rivaroxaban  20 mg Oral Daily  ? sodium chloride flush  10-40 mL Intracatheter Q12H  ? sodium chloride flush  3 mL Intravenous Q12H  ? terazosin  10 mg Oral QHS  ? ?Continuous Infusions: ? sodium chloride    ? furosemide (LASIX) 200 mg in dextrose 5% 100 mL (2mg /mL) infusion 10 mg/hr (02/21/22 1019)  ? ?PRN Meds: ?sodium chloride, acetaminophen **OR** [DISCONTINUED] acetaminophen, albuterol, diclofenac Sodium, guaiFENesin, HYDROcodone-acetaminophen, metoprolol tartrate, ondansetron **OR** ondansetron (ZOFRAN) IV, ondansetron (ZOFRAN) IV, senna-docusate, sodium chloride flush, sodium chloride flush  ? ?Vital Signs  ?  ?Vitals:  ? 02/21/22 1530 02/21/22 1953 02/22/22 0036 02/22/22 0317  ?BP: 121/72 128/66  131/66  ?Pulse: 79 77  75  ?Resp: (!) 23 20  18   ?Temp: 98 ?F (36.7 ?C) 98 ?F (36.7 ?C)  98 ?F (36.7 ?C)  ?TempSrc: Oral Oral  Oral  ?SpO2: 98% 99%  99%  ?Weight:   (!) 177.8 kg   ?Height:      ? ? ?Intake/Output Summary (Last 24 hours) at 02/22/2022 0752 ?Last data filed at 02/22/2022 0719 ?Gross per 24 hour  ?Intake 360 ml  ?Output 2900 ml  ?Net -2540 ml  ? ? ? ?  02/22/2022  ? 12:36 AM 02/20/2022  ?  3:20 AM 02/19/2022  ?  6:05 AM  ?Last 3 Weights  ?Weight (lbs) 392 lb 394 lb 393 lb 9.6 oz  ?Weight (kg) 177.81 kg 178.717 kg 178.536 kg  ?    ? ?Telemetry  ?  ?Atrial fibrillation with CVR- Personally Reviewed ? ?ECG  ?  ?No new tracings - Personally Reviewed ? ?Physical Exam  ? ?GEN: Well nourished, well developed in no acute distress, morbidly obese ?HEENT: Normal ?NECK: No JVD; No carotid bruits ?LYMPHATICS: No lymphadenopathy ?CARDIAC:irregularly irregular, no murmurs, rubs, gallops ?RESPIRATORY:  Clear to auscultation without rales, wheezing or rhonchi  ?ABDOMEN: Soft, non-tender, non-distended ?MUSCULOSKELETAL:  Marked b/l LE edema; No deformity  ?SKIN: Warm and dry ?NEUROLOGIC:  Alert and oriented x 3 ?PSYCHIATRIC:  Normal affect   ?Labs  ?  ?High Sensitivity Troponin:   ?Recent Labs  ?Lab 02/13/22 ?1600 02/13/22 ?1800  ?TROPONINIHS 9 9  ? ?   ?Chemistry ?Recent Labs  ?Lab 02/20/22 ?[id number] 02/21/22 ?[id number] 02/22/22 ?0355  ?NA 135 134* 135  ?K 3.8 3.8 3.8  ?CL 96* 94* 95*  ?CO2 31 29 32  ?GLUCOSE 139* 143* 162*  ?BUN 50* 56* 61*  ?CREATININE 1.89* 1.98* 1.90*  ?CALCIUM 8.3* 8.6* 8.8*  ?MG 2.3 2.3 2.3  ?PROT 6.4*  --   --   ?ALBUMIN 2.9*  --   --   ?AST 17  --   --   ?ALT 17  --   --   ?  ALKPHOS 63  --   --   ?BILITOT 1.1  --   --   ?GFRNONAA 38* 36* 37*  ?ANIONGAP 8 11 8   ? ?  ?Lipids No results for input(s): CHOL, TRIG, HDL, LABVLDL, LDLCALC, CHOLHDL in the last 168 hours.  ?Hematology ?Recent Labs  ?Lab 02/19/22 ?0426 02/19/22 ?0903 02/19/22 ?0907 02/20/22 ?[id number] 02/21/22 ?[id number]  ?WBC 7.3  --   --  7.5 8.6  ?RBC 4.06*  --   --  4.01* 4.21*  ?HGB 9.2*   < > 9.5* 9.1* 9.5*  ?HCT 29.7*   < > 28.0* 29.2* 31.0*  ?MCV 73.2*  --   --  72.8* 73.6*  ?MCH 22.7*  --   --  22.7* 22.6*  ?MCHC 31.0  --   --  31.2 30.6  ?RDW 18.4*  --   --  18.5* 18.5*  ?PLT 206  --   --  210 243  ? < > = values in this interval not displayed.  ? ? ?Thyroid No results for input(s): TSH, FREET4 in the last 168 hours.  ?BNP ?No results for input(s): BNP, PROBNP in the last 168 hours. ?  ?DDimer No results for input(s): DDIMER in the last 168 hours.  ? ?Radiology  ?  ?No results  found. ? ?Cardiac Studies  ? ?Echocardiogram 02/14/22  ? 1. Left ventricular ejection fraction, by estimation, is 55 to 60%. The  ?left ventricle has normal function. The left ventricle has no regional  ?wall motion abnormalities. There is moderate left ventricular hypertrophy.  ?Left ventricular diastolic function  ? could not be evaluated.  ? 2. Right ventricular systolic function was not well visualized. The right  ?ventricular size is not well visualized.  ? 3. Left atrial size was mildly dilated.  ? 4. The mitral valve is normal in structure. No evidence of mitral valve  ?regurgitation.  ? 5. The aortic valve is normal in structure. There is mild calcification  ?of the aortic valve. Aortic valve regurgitation is trivial.  ? ?Patient Profile  ?   ?71 y.o. male with a hx of chronic atrial fibrillatin, chronic combined CHF, HTN, DM type 2, CKD stage 3a, RLS, OSA who is being seen 02/14/2022 for the evaluation of CHF at the request of Dr. Reesa Chew. ? ?Assessment & Plan  ?  ?Acute hypoxic respiratory failure ?Acute on chronic diastolic heart failure ?Right heart failure ?- echo  this admission with hyperdynamic EF ?- CXR with pulmonary vascular congestion, hazy bilateral opacities that may represent mild edema ?- BNP 294, but in the setting of morbid obesity ?- output not complete - pt was going to the bathroom without measuring ?- right heart moderately enlarged and fx moderately reduced ?- COOX up to 69% with diuresis and CVP down from 19 to 15 this am ?-Right heart cath this admission showed marked volume overload right greater than left with cardiac output 7.2, cardiac index 2.6, PCW 22, PA P 63/19 with a mean of 35 mmHg ?-he has massive LE edema into his feet with skin excoriations and probable cellulitis>>wound care following and on antibx ?-Started on Lasix drip yesterday at 8 mg/h and increased to 10mg /hr yesterday along with Metolazone 5mg  and put out 2.5L yesterday and net neg 4.56L since admit ?-Weight slowly  dropping and down 2lbs from yesterday and 8 lbs from admit ?-Serum creatinine stable with diuresis at 1.9 this am and K+ 3.8 ?-continue imdur ?-continue Lasix gtt at 10mg /hr given increased CVP and Metolazone 2.5mg  today ?-  ARB on hold for AKI ?-Continue to follow renal function, strict I's and O's and daily weights ? ?AoCKD 3 ?-Serum creatinine slightly improved from yesterday 1.99>>1.9 ?-Still intravascularly volume overloaded by right heart cath this admission ?-Continue Lasix drip and as needed metolazone ? ?Chronic Afib ?-heart rate controlled ?-continue Cardizem CD 120mg  daily ?-Continue Xarelto ? ?Bilateral LE cellulitis ?-TRH aware and starting antibx. ? ?Disposition ?Unclear - living in a motel PTA ?   ?I have spent a total of 35 minutes with patient reviewing cardiac cath, telemetry, EKGs, labs and examining patient as well as establishing an assessment and plan that was discussed with the patient.  > 50% of time was spent in direct patient care.    ? ?For questions or updates, please contact [place] ?Please consult [email or web address] for contact info under  ? ?  ?   ?Signed, ?Fransico Him, MD  ?02/22/2022, 7:52 AM   ? ?

## 2022-02-22 NOTE — Progress Notes (Signed)
TRH night cross cover note: ? ?I was notified by RN of patient's request for topical Voltaren gel for chronic knee pain.  I subsequently placed order for prn topical Voltaren gel.  Patient's hospitalization for acute on chronic diastolic CHF noted. Will monitor for ensuing change in clinical status as it relates to this acutely decompensated heart failure, although systemic absorption of topical Voltaren gel would be anticipated to be minimal, if any.  ? ? ? ?Newton Pigg, DO ?Hospitalist  ?

## 2022-02-22 NOTE — Telephone Encounter (Signed)
noted 

## 2022-02-22 NOTE — Care Management Important Message (Signed)
Important Message ? ?Patient Details  ?Name: [name] ?MRN: [place]:[id number] ?Date of Birth: Sep 24, 1951 ? ? ?Medicare Important Message Given:  Yes ? ? ? ? ?Shelda Altes ?02/22/2022, 9:41 AM ?

## 2022-02-22 NOTE — Progress Notes (Signed)
Heart Failure Stewardship Pharmacist Progress Note ? ? ?PCP: [name], MD ?PCP-Cardiologist: [name], MD  ? ? ?HPI:  ?71 yo M with PMH of afib, CHF, HTN, T2DM, obesity, OSA, CKD III, and venous stasis. He presented to the ED following a PCP visit on 4/12 with shortness of breath, LE edema, and weight gain. CXR with cardiomegaly and pulmonary vascular congestion. An ECHO was done on 4/13 and LVEF is 55-60% (60-65% in 12/2017) with moderate LVH. RHC on 4/18 with marked volume overload and preserved cardiac output - RA 18, PA 35, wedge 22, CO 7.2. PICC line placed and coox monitoring. ? ?Current HF Medications: ?Diuretic: furosemide 10 mg/hr; metolazone 2.5 mg x 1 ?Other: Imdur 30 mg daily ? ?Prior to admission HF Medications: ?Diuretic: furosemide 80 mg daily ?ACE/ARB/ARNI: valsartan 320 mg daily ?Other: Imdur 30 mg daily ? ?Pertinent Lab Values: ?Serum creatinine 1.90, BUN 61, Potassium 3.8, Sodium 135, BNP 293.8, Magnesium 2.3, A1c 8.4  ?Coox 69, CVP 15 ? ?Vital Signs: ?Weight: 392 lbs (admission weight: 411 lbs) ?Blood pressure: 130/70s  ?Heart rate: 70s  ?I/O: -2.4L yesterday; net -4.3L (inaccurate) ? ?Medication Assistance / Insurance Benefits Check: ?Does the patient have prescription insurance?  Yes ?Type of insurance plan: [facility] Medicare  ? ?Outpatient Pharmacy:  ?Prior to admission outpatient pharmacy: [facility] ?Is the patient willing to use [facility] TOC pharmacy at discharge? Yes ?Is the patient willing to transition their outpatient pharmacy to utilize a [facility] outpatient pharmacy?   Pending ?  ? ?Assessment: ?1. Acute on chronic diastolic CHF (EF 66-06%). NYHA class III symptoms. ?- Continue furosemide IV 10 mg/hr and repeating metolazone ?- PTA valsartan on hold for AKI ?- No SGLT2i with recent UTI - completed ceftriaxone therapy on 4/19 ?- Continue Imdur 30 mg daily ?  ?Plan: ?1) Medication changes recommended at this time: ?- Continue current regimen ? ?2) Patient assistance: ?- None  pending ? ?3)  Education  ?- To be completed prior to discharge ? ?Sharen Hones, PharmD, BCPS ?Heart Failure Stewardship Pharmacist ?Phone [phone] ? ? ?

## 2022-02-23 DIAGNOSIS — N179 Acute kidney failure, unspecified: Secondary | ICD-10-CM | POA: Diagnosis not present

## 2022-02-23 DIAGNOSIS — I5033 Acute on chronic diastolic (congestive) heart failure: Secondary | ICD-10-CM | POA: Diagnosis not present

## 2022-02-23 DIAGNOSIS — I482 Chronic atrial fibrillation, unspecified: Secondary | ICD-10-CM | POA: Diagnosis not present

## 2022-02-23 DIAGNOSIS — E1142 Type 2 diabetes mellitus with diabetic polyneuropathy: Secondary | ICD-10-CM | POA: Diagnosis not present

## 2022-02-23 LAB — COOXEMETRY PANEL
Carboxyhemoglobin: 2.8 % — ABNORMAL HIGH (ref 0.5–1.5)
Methemoglobin: <0.7 % (ref 0.0–1.5)
O2 Saturation: 79.2 %
Total hemoglobin: 9.8 g/dL — ABNORMAL LOW (ref 12.0–16.0)

## 2022-02-23 LAB — GLUCOSE, CAPILLARY
Glucose-Capillary: 188 mg/dL — ABNORMAL HIGH (ref 70–99)
Glucose-Capillary: 118 mg/dL — ABNORMAL HIGH (ref 70–99)
Glucose-Capillary: 240 mg/dL — ABNORMAL HIGH (ref 70–99)
Glucose-Capillary: 215 mg/dL — ABNORMAL HIGH (ref 70–99)

## 2022-02-23 LAB — BASIC METABOLIC PANEL
Anion gap: 9 (ref 5–15)
BUN: 71 mg/dL — ABNORMAL HIGH (ref 8–23)
CO2: 32 mmol/L (ref 22–32)
Calcium: 8.6 mg/dL — ABNORMAL LOW (ref 8.9–10.3)
Chloride: 94 mmol/L — ABNORMAL LOW (ref 98–111)
Creatinine, Ser: 1.88 mg/dL — ABNORMAL HIGH (ref 0.61–1.24)
GFR, Estimated: 38 mL/min — ABNORMAL LOW (ref >60)
Glucose, Bld: 183 mg/dL — ABNORMAL HIGH (ref 70–99)
Potassium: 3.6 mmol/L (ref 3.5–5.1)
Sodium: 135 mmol/L (ref 135–145)

## 2022-02-23 LAB — MAGNESIUM: Magnesium: 2.2 mg/dL (ref 1.7–2.4)

## 2022-02-23 MED ORDER — METOLAZONE 2.5 MG PO TABS
2.5000 mg | ORAL_TABLET | Freq: Once | ORAL | Status: AC
  Administered 2022-02-23: 2.5 mg via ORAL
  Filled 2022-02-23: qty 1

## 2022-02-23 MED ORDER — POTASSIUM CHLORIDE CRYS ER 20 MEQ PO TBCR
40.0000 meq | EXTENDED_RELEASE_TABLET | Freq: Once | ORAL | Status: AC
  Administered 2022-02-23: 40 meq via ORAL
  Filled 2022-02-23: qty 2

## 2022-02-23 NOTE — Progress Notes (Addendum)
?Progress Note ? ? ?Patient: [name] [id number] DOB: 1951-05-26 DOA: 02/13/2022     10 ?DOS: the patient was seen and examined on 02/23/2022 ?  ?Brief hospital course: ?[name] was admitted to the hospital with the working diagnosis of decompensated heart failure.  ? ?71 yo male with the past medical history of atrial fibrillation, hypertension, heart failure, T2DM and obesity who presented with dyspnea and weight gain. Reported progressive weight gain over 4 weeks, 25 lbs weight gain,  associated with lower extremity edema and dyspnea. On his initial physical examination his blood pressure was 129/50, HR 60, RR 20 and 02 saturation 80's on room air. Lungs with decreased breath sounds bilaterally, hearat with S1 and S2 present irregularly irregular, abdomen protuberant, positive lower extremity edema +++ bilaterally.  ? ?Na 138, K 4,7 Cl 105, bicarbonate 25, glucose 124 bun 29 cr 1,80 ?BNP 293  ?High sensitive troponin 9 and 9  ?Wbc 6,8 hgb 10 plt 235  ?Sars covid 19 negative  ? ?Chest radiograph with cardiomegaly and bilateral hilar vascular congestion.  ? ?EKG 65 bpm, right axis deviation, right bundle branch block, qtc 514, atrial fibrillation rhythm with no significant ST segment or T wave changes.  ? ?Patient was placed on furosemide for diuresis. ?Placed on antibiotic therapy for urinary tract infection.  ? ?PICC line was placed for further hemodynamic monitoring.  ? ?04/18 transferred to [facility] from [facility] for further cardiac workup ?Cardiac catheterization PCW 22, cardiac output 10,5 and index 3,8.  ?Placed on furosemide drip for further diuresis.  ? ?Assessment and Plan: ?* Acute on chronic diastolic CHF (congestive heart failure) ([place]) ?Echocardiogram with preserved LV systolic function 55 to [id number] with moderate LVH, not well visualized RV, no significant valvular disease.  ? ?Urine output 2,700 ml over last 24 hrs ?Systolic blood pressure [id number] to 140 mmHg range.  ? ?Had metolazone 04/20, 04/21, 04/22    ?Continue aggressive diuresis with furosemide drip 10 mg per hr.  ?Continue isosorbide  ? ?Acute hypoxemic respiratory failure due to cardiogenic pulmonary edema. ?02 saturation is 94% on   ? ?Atrial fibrillation ([place]) ?Continue rate control with diltiazem and anticoagulation with rivaroxaban.  ? ?Acute kidney injury superimposed on chronic kidney disease ([place]) ?CKD stage 3b. Hyponatremia. Hypokalemia ? ?Renal function today with serum cr at 1,88, K is 3,6 and serum bicarbonate at 32. Mg 2.2  ? ?Responding well to diuresis with furosemide drip 10 mg per hr ?Metolazone 04/20, 4/21, 4/22 ?Continue Kcl to prevent hypokalemia.  ?  ? ?Hypertension, essential ?Continue close blood pressure monitoring. ?On furosemide for diuresis.  ?On isosorbide.  ? ?DM2 (diabetes mellitus, type 2) ([place]) ?Continue glucose cover and monitoring with insulin sliding scale.  ?On basal insulin 70/30 20 units bid.  ?Fasting glucose is 183 today.  ? ?Lymphedema of both lower extremities ?Patient with bilateral feet erythema, dorsal and medial aspect. ?He has completed IV ceftriaxone 5 days for a urinary tract infection (present on admission).  ? ?Plan to continue local care with antifungal.  ?Hold on bacterial antibiotic therapy for now.  ? ? ?Class 3 obesity (HCC) ?Calculated BMI is 56.3.  ? ? ? ? ?  ? ?Subjective: Patient is feeling better, but continue not able to be in bed due to orthopnea and PND.  ? ?Physical Exam: ?Vitals:  ? 02/22/22 1941 02/22/22 2316 02/23/22 0353 02/23/22 0904  ?BP: (!) 148/67 (!) 132/56 126/67 140/67  ?Pulse: 70 70 67   ?Resp: 20 20 18    ?  Temp: 97.9 ?F (36.6 ?C) 98 ?F (36.7 ?C) 98.1 ?F (36.7 ?C)   ?TempSrc: Oral Oral Oral   ?SpO2: 96% 99% 96%   ?Weight:      ?Height:      ? ?Neurology awake and alert ?ENT with no pallor ?Cardiovascular with S1 and S2 present with no gallops, or rubs, positive murmur at the right sternal border ?No JVD (wide neck) ?Positive lower extremity edema +++  ?Respiratory with rales at  bases but no wheezing ?Abdomen protuberant ?Feet with dressings in place.  ?Data Reviewed: ? ? ? ?Family Communication: no family at the bedside  ? ?Disposition: ?Status is: Inpatient ?Remains inpatient appropriate because: heart failure  ? Planned Discharge Destination: Skilled nursing facility ? ? ? ?Author: ?Tawni Millers, MD ?02/23/2022 1:43 PM ? ?For on call review [email or web address].  ?

## 2022-02-23 NOTE — Progress Notes (Signed)
?  Mobility Specialist Criteria Algorithm Info. ? ? 02/23/22 1600  ?Mobility  ?Activity Transferred to/from [facility]  ?Range of Motion/Exercises Active;All extremities  ?Level of Assistance +2 (takes two people) ?(+3-4)  ?Assistive Device BSC;Other (Comment) ?(HHA)  ?Activity Response Tolerated well  ? ?Patient received in recliner chair needing assistance to [facility]. Required Max A +3-4 to assist pt from sit>stand to [facility]. Tolerated well without complaint or incident. Was left with all needs met call bell in reach. ? ?02/23/2022 ?4:08 PM ? ?[name], CMS, BS EXP ?Acute Rehabilitation Services  ?KREQJ:[phone] ?Office: [phone] ? ?

## 2022-02-23 NOTE — TOC Progression Note (Signed)
Transition of Care (TOC) - Progression Note  ? ? ?Patient Details  ?Name: [name] ?MRN: [id number] ?Date of Birth: Mar 19, 1951 ? ?Transition of Care (TOC) CM/SW Contact  ?[name], LCSW ?Phone Number:[phone] ?02/23/2022, 10:55 AM ? ?Clinical Narrative:    ? ?Pt has no current bed offers. ? ?TOC team will continue to assist with discharge planning needs.  ? ? ?Expected Discharge Plan: Skilled Nursing Facility ?Barriers to Discharge: Continued Medical Work up ? ?Expected Discharge Plan and Services ?Expected Discharge Plan: Skilled Nursing Facility ?In-house Referral: NA ?Discharge Planning Services: CM Consult ?  ?Living arrangements for the past 2 months: Hotel/Motel ?                ?  ?DME Agency: NA ?  ?  ?  ?  ?  ?  ?  ?  ? ? ?Social Determinants of Health (SDOH) Interventions ?  ? ?Readmission Risk Interventions ? ?  02/15/2022  ?  2:32 PM  ?Readmission Risk Prevention Plan  ?Transportation Screening Complete  ?PCP or Specialist Appt within 5-7 Days Complete  ? ? ?

## 2022-02-23 NOTE — Progress Notes (Signed)
Unable to obtain pt's weight at this time, pt needs a hoyer lift and is unable to turn to sides to place the pads, pt is in a recliner chair. Attempted to use the standing scale but pt is unable to get up. Charge nurse made aware, ?

## 2022-02-23 NOTE — Progress Notes (Signed)
? ?Progress Note ? ?Patient Name: [name] ?Date of Encounter: 02/23/2022 ? ?Primary Cardiologist:   Pixie Casino, MD ? ? ?Subjective  ? ?He says he is breathing better but still not at baseline.  He is on 2 L nasal cannula.  Unable to sleep in the bed because of spine issues.  Unable to keep his feet elevated because of the back pain. ? ?Inpatient Medications  ?  ?Scheduled Meds: ? Chlorhexidine Gluconate Cloth  6 each Topical Daily  ? diltiazem  120 mg Oral Daily  ? insulin aspart  0-15 Units Subcutaneous TID WC  ? insulin aspart  0-5 Units Subcutaneous QHS  ? insulin aspart protamine- aspart  20 Units Subcutaneous BID WC  ? isosorbide mononitrate  30 mg Oral Daily  ? loratadine  10 mg Oral Daily  ? multivitamin with minerals  1 tablet Oral Daily  ? nystatin cream   Topical BID  ? rivaroxaban  20 mg Oral Daily  ? sodium chloride flush  10-40 mL Intracatheter Q12H  ? sodium chloride flush  3 mL Intravenous Q12H  ? terazosin  10 mg Oral QHS  ? ?Continuous Infusions: ? sodium chloride    ? furosemide (LASIX) 200 mg in dextrose 5% 100 mL (2mg /mL) infusion 10 mg/hr (02/23/22 0105)  ? ?PRN Meds: ?sodium chloride, acetaminophen **OR** [DISCONTINUED] acetaminophen, albuterol, diclofenac Sodium, guaiFENesin, HYDROcodone-acetaminophen, metoprolol tartrate, ondansetron **OR** ondansetron (ZOFRAN) IV, ondansetron (ZOFRAN) IV, senna-docusate, sodium chloride flush, sodium chloride flush  ? ?Vital Signs  ?  ?Vitals:  ? 02/22/22 1941 02/22/22 2316 02/23/22 0353 02/23/22 0904  ?BP: (!) 148/67 (!) 132/56 126/67 140/67  ?Pulse: 70 70 67   ?Resp: 20 20 18    ?Temp: 97.9 ?F (36.6 ?C) 98 ?F (36.7 ?C) 98.1 ?F (36.7 ?C)   ?TempSrc: Oral Oral Oral   ?SpO2: 96% 99% 96%   ?Weight:      ?Height:      ? ? ?Intake/Output Summary (Last 24 hours) at 02/23/2022 1010 ?Last data filed at 02/23/2022 [phone] ?Gross per 24 hour  ?Intake 831.51 ml  ?Output 2025 ml  ?Net -1193.49 ml  ? ?Filed Weights  ? 02/19/22 0605 02/20/22 0320 02/22/22 0036   ?Weight: (!) 178.5 kg (!) 178.7 kg (!) 177.8 kg  ? ? ?Telemetry  ?  ?Atrial fib with controlled ventricular rate- Personally Reviewed ? ?ECG  ?  ?NA - Personally Reviewed ? ?Physical Exam  ? ?GEN: No acute distress.   ?Neck: No  JVD ?Cardiac: Irregular RR, no murmurs, rubs, or gallops.  Difficult to assess because of decreased heart sounds ?Respiratory: Clear  to auscultation bilaterally. ?GI: Soft, nontender, non-distended  ?MS:    Massive anasarca ?Neuro:  Nonfocal  ?Psych: Normal affect  ? ?Labs  ?  ?Chemistry ?Recent Labs  ?Lab 02/20/22 ?[id number] 02/21/22 ?[id number] 02/22/22 ?0355 02/23/22 ?0440  ?NA 135 134* 135 135  ?K 3.8 3.8 3.8 3.6  ?CL 96* 94* 95* 94*  ?CO2 31 29 32 32  ?GLUCOSE 139* 143* 162* 183*  ?BUN 50* 56* 61* 71*  ?CREATININE 1.89* 1.98* 1.90* 1.88*  ?CALCIUM 8.3* 8.6* 8.8* 8.6*  ?PROT 6.4*  --   --   --   ?ALBUMIN 2.9*  --   --   --   ?AST 17  --   --   --   ?ALT 17  --   --   --   ?ALKPHOS 63  --   --   --   ?BILITOT 1.1  --   --   --   ?  GFRNONAA 38* 36* 37* 38*  ?ANIONGAP 8 11 8 9   ?  ? ?Hematology ?Recent Labs  ?Lab 02/19/22 ?0426 02/19/22 ?0903 02/19/22 ?0907 02/20/22 ?[id number] 02/21/22 ?[id number]  ?WBC 7.3  --   --  7.5 8.6  ?RBC 4.06*  --   --  4.01* 4.21*  ?HGB 9.2*   < > 9.5* 9.1* 9.5*  ?HCT 29.7*   < > 28.0* 29.2* 31.0*  ?MCV 73.2*  --   --  72.8* 73.6*  ?MCH 22.7*  --   --  22.7* 22.6*  ?MCHC 31.0  --   --  31.2 30.6  ?RDW 18.4*  --   --  18.5* 18.5*  ?PLT 206  --   --  210 243  ? < > = values in this interval not displayed.  ? ? ?Cardiac EnzymesNo results for input(s): TROPONINI in the last 168 hours. No results for input(s): TROPIPOC in the last 168 hours.  ? ?BNPNo results for input(s): BNP, PROBNP in the last 168 hours.  ? ?DDimer No results for input(s): DDIMER in the last 168 hours.  ? ?Radiology  ?  ?No results found. ? ?Cardiac Studies  ? ?Right heart cath:  ? ?RA = 18 ?RV = 60/24 ?PA =  63/19 (35) ?PCW = 22  ?Fick cardiac output/index = 10.5/3.8 ?Thermo CO/CI = 7.2/2.6 ?PVR = 1.9 WU (Thermo) ?FA  sat = 92% ?PA sat = 63%, 65% ?High SVC = 68% ?PAPi = 2.4 ?  ?Echo:   ? ? 1. Left ventricular ejection fraction, by estimation, is 55 to 60%. The  ?left ventricle has normal function. The left ventricle has no regional  ?wall motion abnormalities. There is moderate left ventricular hypertrophy.  ?Left ventricular diastolic function  ? could not be evaluated.  ? 2. Right ventricular systolic function was not well visualized. The right  ?ventricular size is not well visualized.  ? 3. Left atrial size was mildly dilated.  ? 4. The mitral valve is normal in structure. No evidence of mitral valve  ?regurgitation.  ? 5. The aortic valve is normal in structure. There is mild calcification  ?of the aortic valve. Aortic valve regurgitation is trivial.  ? ?Patient Profile  ?   ?71 y.o. male with a hx of chronic atrial fibrillatin, chronic combined CHF, HTN, DM type 2, CKD stage 3a, RLS, OSA who is being seen 02/14/2022 for the evaluation of CHF at the request of Dr. Reesa Chew. ? ?Assessment & Plan  ?  ? ?Acute on chronic diastolic heart failure: Right heart cath with evidence of significant volume overload.  Net negative appears to be 5.9 L.  Weight appears to be down perhaps 9 pounds.  Tolerating IV Lasix drip.  Received 2.5 mg Zaroxolyn yesterday.  CVP is low.  Co. ox 79.2.    ? ?AoCKD 3: Creatinine has trended down slightly. ? ?Chronic Afib: Continue Xarelto.  Continue Cardizem.  Rate is adequately controlled. ? ?Bilateral LE cellulitis: Management per primary team. ? ?For questions or updates, please contact [place] ?Please consult [email or web address] for contact info under Cardiology/STEMI. ?  ?Signed, ?Minus Breeding, MD  ?02/23/2022, 10:10 AM   ? ?

## 2022-02-24 DIAGNOSIS — I5033 Acute on chronic diastolic (congestive) heart failure: Secondary | ICD-10-CM | POA: Diagnosis not present

## 2022-02-24 DIAGNOSIS — N179 Acute kidney failure, unspecified: Secondary | ICD-10-CM | POA: Diagnosis not present

## 2022-02-24 DIAGNOSIS — I482 Chronic atrial fibrillation, unspecified: Secondary | ICD-10-CM | POA: Diagnosis not present

## 2022-02-24 LAB — BASIC METABOLIC PANEL
Anion gap: 11 (ref 5–15)
BUN: 79 mg/dL — ABNORMAL HIGH (ref 8–23)
CO2: 31 mmol/L (ref 22–32)
Calcium: 8.6 mg/dL — ABNORMAL LOW (ref 8.9–10.3)
Chloride: 92 mmol/L — ABNORMAL LOW (ref 98–111)
Creatinine, Ser: 1.74 mg/dL — ABNORMAL HIGH (ref 0.61–1.24)
GFR, Estimated: 42 mL/min — ABNORMAL LOW (ref >60)
Glucose, Bld: 193 mg/dL — ABNORMAL HIGH (ref 70–99)
Potassium: 3.8 mmol/L (ref 3.5–5.1)
Sodium: 134 mmol/L — ABNORMAL LOW (ref 135–145)

## 2022-02-24 LAB — COOXEMETRY PANEL
Carboxyhemoglobin: 0.9 % (ref 0.5–1.5)
Methemoglobin: 3.1 % — ABNORMAL HIGH (ref 0.0–1.5)
O2 Saturation: 71 %
Total hemoglobin: 10.3 g/dL — ABNORMAL LOW (ref 12.0–16.0)

## 2022-02-24 LAB — CBC
HCT: 30.2 % — ABNORMAL LOW (ref 39.0–52.0)
Hemoglobin: 9.4 g/dL — ABNORMAL LOW (ref 13.0–17.0)
MCH: 22.5 pg — ABNORMAL LOW (ref 26.0–34.0)
MCHC: 31.1 g/dL (ref 30.0–36.0)
MCV: 72.4 fL — ABNORMAL LOW (ref 80.0–100.0)
Platelets: 284 10*3/uL (ref 150–400)
RBC: 4.17 MIL/uL — ABNORMAL LOW (ref 4.22–5.81)
RDW: 18.6 % — ABNORMAL HIGH (ref 11.5–15.5)
WBC: 8.5 10*3/uL (ref 4.0–10.5)
nRBC: 0 % (ref 0.0–0.2)

## 2022-02-24 LAB — GLUCOSE, CAPILLARY
Glucose-Capillary: 174 mg/dL — ABNORMAL HIGH (ref 70–99)
Glucose-Capillary: 205 mg/dL — ABNORMAL HIGH (ref 70–99)
Glucose-Capillary: 183 mg/dL — ABNORMAL HIGH (ref 70–99)
Glucose-Capillary: 185 mg/dL — ABNORMAL HIGH (ref 70–99)

## 2022-02-24 LAB — MAGNESIUM: Magnesium: 2.1 mg/dL (ref 1.7–2.4)

## 2022-02-24 MED ORDER — POTASSIUM CHLORIDE CRYS ER 20 MEQ PO TBCR
40.0000 meq | EXTENDED_RELEASE_TABLET | Freq: Once | ORAL | Status: AC
  Administered 2022-02-24: 40 meq via ORAL
  Filled 2022-02-24: qty 2

## 2022-02-24 MED ORDER — METOLAZONE 2.5 MG PO TABS
2.5000 mg | ORAL_TABLET | Freq: Once | ORAL | Status: AC
  Administered 2022-02-24: 2.5 mg via ORAL
  Filled 2022-02-24: qty 1

## 2022-02-24 MED ORDER — SPIRONOLACTONE 12.5 MG HALF TABLET
12.5000 mg | ORAL_TABLET | Freq: Every day | ORAL | Status: DC
Start: 2022-02-24 — End: 2022-02-27
  Administered 2022-02-24 – 2022-02-26 (×3): 12.5 mg via ORAL
  Filled 2022-02-24 (×3): qty 1

## 2022-02-24 NOTE — Progress Notes (Signed)
? ?Progress Note ? ?Patient Name: [name] ?Date of Encounter: 02/24/2022 ? ?Primary Cardiologist:   Pixie Casino, MD ? ? ?Subjective  ? ?No acute pain or SOB.  ? ?Inpatient Medications  ?  ?Scheduled Meds: ? Chlorhexidine Gluconate Cloth  6 each Topical Daily  ? diltiazem  120 mg Oral Daily  ? insulin aspart  0-15 Units Subcutaneous TID WC  ? insulin aspart  0-5 Units Subcutaneous QHS  ? insulin aspart protamine- aspart  20 Units Subcutaneous BID WC  ? isosorbide mononitrate  30 mg Oral Daily  ? loratadine  10 mg Oral Daily  ? multivitamin with minerals  1 tablet Oral Daily  ? nystatin cream   Topical BID  ? rivaroxaban  20 mg Oral Daily  ? sodium chloride flush  10-40 mL Intracatheter Q12H  ? sodium chloride flush  3 mL Intravenous Q12H  ? terazosin  10 mg Oral QHS  ? ?Continuous Infusions: ? sodium chloride    ? furosemide (LASIX) 200 mg in dextrose 5% 100 mL (2mg /mL) infusion 10 mg/hr (02/23/22 2116)  ? ?PRN Meds: ?sodium chloride, acetaminophen **OR** [DISCONTINUED] acetaminophen, albuterol, diclofenac Sodium, guaiFENesin, HYDROcodone-acetaminophen, metoprolol tartrate, ondansetron **OR** ondansetron (ZOFRAN) IV, ondansetron (ZOFRAN) IV, senna-docusate, sodium chloride flush, sodium chloride flush  ? ?Vital Signs  ?  ?Vitals:  ? 02/23/22 2100 02/24/22 0024 02/24/22 [id number] 02/24/22 0723  ?BP: 129/66 134/68  135/68  ?Pulse: 69 77  80  ?Resp:    15  ?Temp: 98.5 ?F (36.9 ?C) 98.7 ?F (37.1 ?C) 98.5 ?F (36.9 ?C) (!) 97.3 ?F (36.3 ?C)  ?TempSrc: Oral Oral Oral Oral  ?SpO2:  95%  92%  ?Weight:      ?Height:      ? ? ?Intake/Output Summary (Last 24 hours) at 02/24/2022 1022 ?Last data filed at 02/24/2022 [phone] ?Gross per 24 hour  ?Intake 541.22 ml  ?Output 2450 ml  ?Net -1908.78 ml  ? ?Filed Weights  ? 02/19/22 0605 02/20/22 0320 02/22/22 0036  ?Weight: (!) 178.5 kg (!) 178.7 kg (!) 177.8 kg  ? ? ?Telemetry  ?  ?Atrial fib with controlled ventricular rate- Personally Reviewed ? ?ECG  ?  ?NA - Personally  Reviewed ? ?Physical Exam  ? ?GEN: No  acute distress.   ?Neck: No  JVD ?Cardiac: Irregular RR, no murmurs, rubs, or gallops.  ?Respiratory: Clear   to auscultation bilaterally. ?GI: Soft, nontender, non-distended, normal bowel sounds  ?MS:  Massive leg and scrotal edema; No deformity. ?Neuro:   Nonfocal  ?Psych: Oriented and appropriate  ? ? ?Labs  ?  ?Chemistry ?Recent Labs  ?Lab 02/20/22 ?[id number] 02/21/22 ?[id number] 02/22/22 ?0355 02/23/22 ?0440 02/24/22 ?0530  ?NA 135   < > 135 135 134*  ?K 3.8   < > 3.8 3.6 3.8  ?CL 96*   < > 95* 94* 92*  ?CO2 31   < > 32 32 31  ?GLUCOSE 139*   < > 162* 183* 193*  ?BUN 50*   < > 61* 71* 79*  ?CREATININE 1.89*   < > 1.90* 1.88* 1.74*  ?CALCIUM 8.3*   < > 8.8* 8.6* 8.6*  ?PROT 6.4*  --   --   --   --   ?ALBUMIN 2.9*  --   --   --   --   ?AST 17  --   --   --   --   ?ALT 17  --   --   --   --   ?ALKPHOS  29  --   --   --   --   ?BILITOT 1.1  --   --   --   --   ?GFRNONAA 38*   < > 37* 38* 42*  ?ANIONGAP 8   < > 8 9 11   ? < > = values in this interval not displayed.  ?  ? ?Hematology ?Recent Labs  ?Lab 02/20/22 ?[id number] 02/21/22 ?[id number] 02/24/22 ?0530  ?WBC 7.5 8.6 8.5  ?RBC 4.01* 4.21* 4.17*  ?HGB 9.1* 9.5* 9.4*  ?HCT 29.2* 31.0* 30.2*  ?MCV 72.8* 73.6* 72.4*  ?MCH 22.7* 22.6* 22.5*  ?MCHC 31.2 30.6 31.1  ?RDW 18.5* 18.5* 18.6*  ?PLT 210 243 284  ? ? ?Cardiac EnzymesNo results for input(s): TROPONINI in the last 168 hours. No results for input(s): TROPIPOC in the last 168 hours.  ? ?BNPNo results for input(s): BNP, PROBNP in the last 168 hours.  ? ?DDimer No results for input(s): DDIMER in the last 168 hours.  ? ?Radiology  ?  ?No results found. ? ?Cardiac Studies  ? ?Right heart cath:  ? ?RA = 18 ?RV = 60/24 ?PA =  63/19 (35) ?PCW = 22  ?Fick cardiac output/index = 10.5/3.8 ?Thermo CO/CI = 7.2/2.6 ?PVR = 1.9 WU (Thermo) ?FA sat = 92% ?PA sat = 63%, 65% ?High SVC = 68% ?PAPi = 2.4 ?  ?Echo:   ? ? 1. Left ventricular ejection fraction, by estimation, is 55 to 60%. The  ?left ventricle has normal  function. The left ventricle has no regional  ?wall motion abnormalities. There is moderate left ventricular hypertrophy.  ?Left ventricular diastolic function  ? could not be evaluated.  ? 2. Right ventricular systolic function was not well visualized. The right  ?ventricular size is not well visualized.  ? 3. Left atrial size was mildly dilated.  ? 4. The mitral valve is normal in structure. No evidence of mitral valve  ?regurgitation.  ? 5. The aortic valve is normal in structure. There is mild calcification  ?of the aortic valve. Aortic valve regurgitation is trivial.  ? ?Patient Profile  ?   ?71 y.o. male with a hx of chronic atrial fibrillatin, chronic combined CHF, HTN, DM type 2, CKD stage 3a, RLS, OSA who is being seen 02/14/2022 for the evaluation of CHF at the request of Dr. Reesa Chew. ? ?Assessment & Plan  ?  ? ?Acute on chronic diastolic heart failure: Right heart cath with evidence of significant volume overload.  Net negative appears to be 5.9 L.  Weight appears to be down another 2 pounds.  I gave him Zaroxolyn again yesterday.  Creatinine is tolerating this aggressive diuresis.  CVP remains low.    I will give Zaroxolyn again 2.5 mg today.   ? ?AoCKD 3: Creatinine has continued to trend down. ? ?Chronic Afib:   Rate controlled.  Continue Xarelto and Cardizem.   ? ?Bilateral LE cellulitis: Management per primary team. ? ?For questions or updates, please contact [place] ?Please consult [email or web address] for contact info under Cardiology/STEMI. ?  ?Signed, ?Minus Breeding, MD  ?02/24/2022, 10:22 AM   ? ?

## 2022-02-24 NOTE — Progress Notes (Signed)
Mobility Specialist Progress Note: ? ? 02/24/22 1409  ?Mobility  ?Activity Transferred to/from [facility]  ?Level of Assistance Other (Comment) ?(takes +3-4 people)  ?Assistive Device Four wheel walker;BSC  ?Activity Response Tolerated fair  ?$Mobility charge 1 Mobility  ? ?PT received on BSC needing to get back to chair. ModA+4 to stand. Pt let in chair with call bell in reach and all needs met.  ? ?[name] ?Mobility Specialist ?Primary Phone [phone] ? ?

## 2022-02-24 NOTE — Progress Notes (Signed)
?Progress Note ? ? ?Patient: [name] [id number] DOB: 06-24-1951 DOA: 02/13/2022     11 ?DOS: the patient was seen and examined on 02/24/2022 ?  ?Brief hospital course: ?[name] was admitted to the hospital with the working diagnosis of decompensated heart failure.  ? ?70 yo male with the past medical history of atrial fibrillation, hypertension, heart failure, T2DM and obesity who presented with dyspnea and weight gain. Reported progressive weight gain over 4 weeks, 25 lbs weight gain,  associated with lower extremity edema and dyspnea. On his initial physical examination his blood pressure was 129/50, HR 60, RR 20 and 02 saturation 80's on room air. Lungs with decreased breath sounds bilaterally, hearat with S1 and S2 present irregularly irregular, abdomen protuberant, positive lower extremity edema +++ bilaterally.  ? ?Na 138, K 4,7 Cl 105, bicarbonate 25, glucose 124 bun 29 cr 1,80 ?BNP 293  ?High sensitive troponin 9 and 9  ?Wbc 6,8 hgb 10 plt 235  ?Sars covid 19 negative  ? ?Chest radiograph with cardiomegaly and bilateral hilar vascular congestion.  ? ?EKG 65 bpm, right axis deviation, right bundle branch block, qtc 514, atrial fibrillation rhythm with no significant ST segment or T wave changes.  ? ?Patient was placed on furosemide for diuresis. ?Placed on antibiotic therapy for urinary tract infection.  ? ?PICC line was placed for further hemodynamic monitoring.  ? ?04/18 transferred to [facility] from [facility] for further cardiac workup ?Cardiac catheterization PCW 22, cardiac output 10,5 and index 3,8.  ?Placed on furosemide drip for further diuresis.  ? ?Assessment and Plan: ?* Acute on chronic diastolic CHF (congestive heart failure) ([place]) ?Echocardiogram with preserved LV systolic function 55 to 123456 with moderate LVH, not well visualized RV, no significant valvular disease.  ? ?Urine output 1,900 ml over last 24 hrs ?Systolic blood pressure [id number] to 120 mmHg range.  ?Continue to have significant volume  overload.  ? ?Had metolazone 04/20, 04/21, 04/22, 0423.  ?Diuresis with furosemide drip 10 mg per hr.  ?Continue isosorbide  ? ?Acute hypoxemic respiratory failure due to cardiogenic pulmonary edema. ?02 saturation is 92% on [place] 2 L.min  ? ?Atrial fibrillation ([place]) ?Continue rate control with diltiazem and anticoagulation with rivaroxaban.  ? ?Acute kidney injury superimposed on chronic kidney disease ([place]) ?CKD stage 3b. Hyponatremia. Hypokalemia ? ?Patient continue to have significant hypervolemia, ?Renal function with serum cr at 1,74, K 3,8 and serum bicarbonate at 31, Na 138.  ? ?Diuresis with furosemide drip 10 mg per hr ?Metolazone 04/20, 4/21, 4/22, 4/23 ?Hold on empagliflozin due to suspected urine infection.  ?Add spironolactone.  ?Continue Kcl repletion, follow up on renal function and electrolytes in am.  ? ?Hypertension, essential ?Blood pressure has remained stable, continue with isosorbide and aggressive diuresis with furosemide and metolazone.  ? ?DM2 (diabetes mellitus, type 2) ([place]) ?Glucose cover and monitoring with insulin sliding scale.  ?On basal insulin 70/30 20 units bid.  ?Fasting glucose is 193 today.  ? ?Lymphedema of both lower extremities ?Patient with bilateral feet erythema, dorsal and medial aspect. ?He has completed IV ceftriaxone 5 days for a urinary tract infection (present on admission).  ? ?Plan to continue local care with antifungal.  ?Hold on bacterial antibiotic therapy for now.  ?Patient with dysuria, his urine culture had 40,000 CFU multiple species.  ? ? ?Class 3 obesity (HCC) ?Calculated BMI is 56.3.  ? ? ? ? ?  ? ?Subjective: Patient with dysuria and scrotal edea, continue to have lower extremity edema and  orthopnea, not able to stay in bed. He has been sleeping in the recliner.  ? ?Physical Exam: ?Vitals:  ? 02/23/22 2100 02/24/22 0024 02/24/22 [id number] 02/24/22 0723  ?BP: 129/66 134/68  135/68  ?Pulse: 69 77  80  ?Resp:    15  ?Temp: 98.5 ?F (36.9 ?C) 98.7 ?F (37.1 ?C) 98.5  ?F (36.9 ?C) (!) 97.3 ?F (36.3 ?C)  ?TempSrc: Oral Oral Oral Oral  ?SpO2:  95%  92%  ?Weight:      ?Height:      ? ?Neurology awake and alert ?ENT with mild pallor ?Cardiovascular with S1 and S2 present and rhythmic with no gallops, rubs or murmurs ?Positive lower extremity edema +++ pitting, scrotal edema. Legs with wraps in place.  ?Respiratory with rales at bases, no wheezing ?Abdomen protuberant but not distended ?Skin rash feet with dressing in place.  ?Data Reviewed: ? ? ? ?Family Communication: no family at the bedside  ? ?Disposition: ?Status is: Inpatient ?Remains inpatient appropriate because: heart failure  ? Planned Discharge Destination: Skilled nursing facility ? ? ? ? ?Author: ?Tawni Millers, MD ?02/24/2022 10:51 AM ? ?For on call review [email or web address].  ?

## 2022-02-25 DIAGNOSIS — I5033 Acute on chronic diastolic (congestive) heart failure: Secondary | ICD-10-CM | POA: Diagnosis not present

## 2022-02-25 DIAGNOSIS — I4819 Other persistent atrial fibrillation: Secondary | ICD-10-CM | POA: Diagnosis not present

## 2022-02-25 DIAGNOSIS — E1142 Type 2 diabetes mellitus with diabetic polyneuropathy: Secondary | ICD-10-CM | POA: Diagnosis not present

## 2022-02-25 DIAGNOSIS — N179 Acute kidney failure, unspecified: Secondary | ICD-10-CM | POA: Diagnosis not present

## 2022-02-25 LAB — URINALYSIS, ROUTINE W REFLEX MICROSCOPIC
Bilirubin Urine: NEGATIVE
Glucose, UA: NEGATIVE mg/dL
Ketones, ur: NEGATIVE mg/dL
Nitrite: NEGATIVE
Protein, ur: NEGATIVE mg/dL
RBC / HPF: >50 RBC/hpf — ABNORMAL HIGH (ref 0–5)
Specific Gravity, Urine: 1.01 (ref 1.005–1.030)
pH: 5 (ref 5.0–8.0)

## 2022-02-25 LAB — BASIC METABOLIC PANEL
Anion gap: 10 (ref 5–15)
BUN: 86 mg/dL — ABNORMAL HIGH (ref 8–23)
CO2: 33 mmol/L — ABNORMAL HIGH (ref 22–32)
Calcium: 8.8 mg/dL — ABNORMAL LOW (ref 8.9–10.3)
Chloride: 91 mmol/L — ABNORMAL LOW (ref 98–111)
Creatinine, Ser: 1.92 mg/dL — ABNORMAL HIGH (ref 0.61–1.24)
GFR, Estimated: 37 mL/min — ABNORMAL LOW (ref >60)
Glucose, Bld: 187 mg/dL — ABNORMAL HIGH (ref 70–99)
Potassium: 4 mmol/L (ref 3.5–5.1)
Sodium: 134 mmol/L — ABNORMAL LOW (ref 135–145)

## 2022-02-25 LAB — GLUCOSE, CAPILLARY
Glucose-Capillary: 209 mg/dL — ABNORMAL HIGH (ref 70–99)
Glucose-Capillary: 171 mg/dL — ABNORMAL HIGH (ref 70–99)
Glucose-Capillary: 236 mg/dL — ABNORMAL HIGH (ref 70–99)
Glucose-Capillary: 272 mg/dL — ABNORMAL HIGH (ref 70–99)

## 2022-02-25 LAB — COOXEMETRY PANEL
Carboxyhemoglobin: 1.8 % — ABNORMAL HIGH (ref 0.5–1.5)
Methemoglobin: <0.7 % (ref 0.0–1.5)
O2 Saturation: 57 %
Total hemoglobin: 10 g/dL — ABNORMAL LOW (ref 12.0–16.0)

## 2022-02-25 LAB — MAGNESIUM: Magnesium: 2.2 mg/dL (ref 1.7–2.4)

## 2022-02-25 MED ORDER — METOLAZONE 2.5 MG PO TABS
2.5000 mg | ORAL_TABLET | Freq: Every day | ORAL | Status: DC
  Administered 2022-02-25 – 2022-02-27 (×3): 2.5 mg via ORAL
  Filled 2022-02-25 (×3): qty 1

## 2022-02-25 NOTE — Progress Notes (Signed)
Inpatient Diabetes Program Recommendations ? ?AACE/ADA: New Consensus Statement on Inpatient Glycemic Control (2015) ? ?Target Ranges:  Prepandial:   less than 140 mg/dL ?     Peak postprandial:   less than 180 mg/dL (1-2 hours) ?     Critically ill patients:  140 - 180 mg/dL  ? ?Lab Results  ?Component Value Date  ? GLUCAP 209 (H) 02/25/2022  ? HGBA1C 8.4 (H) 01/09/2022  ? ? ?Review of Glycemic Control ? Latest Reference Range & Units 02/24/22 06:47 02/24/22 11:16 02/24/22 15:58 02/24/22 21:40 02/25/22 06:30  ?Glucose-Capillary 70 - 99 mg/dL 174 (H) 205 (H) 183 (H) 185 (H) 209 (H)  ? ?Diabetes history: DM 2 ?Outpatient Diabetes medications: 75/25 20-34 units bid per SSI, Metformin 1000 mg bid ?Current orders for Inpatient glycemic control:  ?70/30 20 units bid ?Novolog 0-15 units tid + hs ? ?Inpatient Diabetes Program Recommendations:   ? ?-  Increase 70/30 to 22 -24 units bid ? ?Thanks, ? ?Tama Headings RN, MSN, BC-ADM ?Inpatient Diabetes Coordinator ?Team Pager [phone] (8a-5p) ?

## 2022-02-25 NOTE — Progress Notes (Signed)
?Progress Note ? ? ?Patient: [name] [id number] DOB: [birth year] DOA: 02/13/2022     12 ?DOS: the patient was seen and examined on 02/25/2022 ?  ?Brief hospital course: ?[name] was admitted to the hospital with the working diagnosis of decompensated heart failure.  ? ?71 yo male with the past medical history of atrial fibrillation, hypertension, heart failure, T2DM and obesity who presented with dyspnea and weight gain. Reported progressive weight gain over 4 weeks, 25 lbs weight gain,  associated with lower extremity edema and dyspnea. On his initial physical examination his blood pressure was 129/50, HR 60, RR 20 and 02 saturation 80's on room air. Lungs with decreased breath sounds bilaterally, hearat with S1 and S2 present irregularly irregular, abdomen protuberant, positive lower extremity edema +++ bilaterally.  ? ?Na 138, K 4,7 Cl 105, bicarbonate 25, glucose 124 bun 29 cr 1,80 ?BNP 293  ?High sensitive troponin 9 and 9  ?Wbc 6,8 hgb 10 plt 235  ?Sars covid 19 negative  ? ?Chest radiograph with cardiomegaly and bilateral hilar vascular congestion.  ? ?EKG 65 bpm, right axis deviation, right bundle branch block, qtc 514, atrial fibrillation rhythm with no significant ST segment or T wave changes.  ? ?Patient was placed on furosemide for diuresis. ?Placed on antibiotic therapy for urinary tract infection.  ? ?PICC line was placed for further hemodynamic monitoring.  ? ?04/18 transferred to [facility] from [facility] for further cardiac workup ?Cardiac catheterization PCW 22, cardiac output 10,5 and index 3,8.  ?Placed on furosemide drip for further diuresis.  ? ?04/24 patient continue with significant volume overload despite aggressive diuresis.  ? ?Assessment and Plan: ?* Acute on chronic diastolic CHF (congestive heart failure) ([place]) ?Echocardiogram with preserved LV systolic function 55 to 123456 with moderate LVH, not well visualized RV, no significant valvular disease.  ? ?Urine output 2,375 ml over last 24  hrs ?Systolic blood pressure [id number] to 116 mmHg range.  ?Continue to have significant volume overload.  ? ?Furosemide drip 10 mg per hr.  ?04/20 daily metolazone 2,5 mg  ? ?Continue isosorbide  ? ?Acute hypoxemic respiratory failure due to cardiogenic pulmonary edema. ?02 saturation is 92% on [place] 2 L.min  ? ?Atrial fibrillation ([place]) ?On diltiazem for rate control and anticoagulation with rivaroxaban.  ? ?Acute kidney injury superimposed on chronic kidney disease ([place]) ?CKD stage 3b. Hyponatremia. Hypokalemia ? ?Renal function with serum cr at 1,92, K is 4,0 and serum bicarbonate at 33.  ?Na 134, Mg 2.2 ? ?Continue diuresis with furosemide drip 10 mg per hr along with daily metolazone  ?No empagliflozin due to suspected urine infection.  ?Add spironolactone.  ? ?Follow up renal function and electrolytes, continue K supplementation to prevent hypokalemia.  ? ?Hypertension, essential ?Blood pressure has remained stable, continue with isosorbide and aggressive diuresis with furosemide and metolazone.  ? ?DM2 (diabetes mellitus, type 2) ([place]) ?Glucose cover and monitoring with insulin sliding scale.  ?On basal insulin 70/30 20 units bid.  ?Fasting glucose is 187 mg/dl  ? ?Lymphedema of both lower extremities ?Patient with bilateral feet erythema, dorsal and medial aspect. ?He has completed IV ceftriaxone 5 days for a urinary tract infection (present on admission).  ? ?Plan to continue local care with antifungal.  ?Hold on bacterial antibiotic therapy for now.  ?Patient with dysuria, his urine culture had 40,000 CFU multiple species.  ? ? ?Class 3 obesity (HCC) ?Calculated BMI is 56.3.  ? ? ? ? ?  ? ?Subjective: Patient continue to have edema  lower extremities, no worsening dyspnea, but continue not able to lay in the bed due to orthopnea  ? ?Physical Exam: ?Vitals:  ? 02/24/22 0723 02/24/22 1116 02/24/22 1942 02/25/22 1105  ?BP: 135/68 126/69 (!) 155/72 116/71  ?Pulse: 80 63 79 70  ?Resp: 15 17 17 19   ?Temp: (!) 97.3 ?F  (36.3 ?C)  97.6 ?F (36.4 ?C) 97.9 ?F (36.6 ?C)  ?TempSrc: Oral  Oral   ?SpO2: 92% 95% 93% 94%  ?Weight:      ?Height:      ? ?Neurology awake and alert ?ENT with no pallor ?Cardiovascular with S1 and S2 present and rhythmic with no gallops, rubs or murmurs ?Respiratory with no rales or wheezing ?Abdomen protuberant but not distended ?Positive lower extremity edema +++ pitting, wraps in place.  ?Data Reviewed: ? ? ? ?Family Communication: no family at the bedside  ? ?Disposition: ?Status is: Inpatient ?Remains inpatient appropriate because: heart failure with severe volume overload.  ? Planned Discharge Destination: Home ? ? ? ? ? ?Author: ?Kevin Millers, MD ?02/25/2022 2:12 PM ? ?For on call review [email or web address]  ?

## 2022-02-25 NOTE — Progress Notes (Signed)
? ?Progress Note ? ?Patient Name: [name] ?Date of Encounter: 02/25/2022 ? ?Primary Cardiologist:   Pixie Casino, MD ? ? ?Subjective  ? ?No acute pain or SOB. Grossly volume overloaded with ulcers in LEls  ? ?Inpatient Medications  ?  ?Scheduled Meds: ? Chlorhexidine Gluconate Cloth  6 each Topical Daily  ? diltiazem  120 mg Oral Daily  ? insulin aspart  0-15 Units Subcutaneous TID WC  ? insulin aspart  0-5 Units Subcutaneous QHS  ? insulin aspart protamine- aspart  20 Units Subcutaneous BID WC  ? isosorbide mononitrate  30 mg Oral Daily  ? loratadine  10 mg Oral Daily  ? multivitamin with minerals  1 tablet Oral Daily  ? nystatin cream   Topical BID  ? rivaroxaban  20 mg Oral Daily  ? sodium chloride flush  10-40 mL Intracatheter Q12H  ? sodium chloride flush  3 mL Intravenous Q12H  ? spironolactone  12.5 mg Oral Daily  ? terazosin  10 mg Oral QHS  ? ?Continuous Infusions: ? sodium chloride    ? furosemide (LASIX) 200 mg in dextrose 5% 100 mL (2mg /mL) infusion 10 mg/hr (02/25/22 0700)  ? ?PRN Meds: ?sodium chloride, acetaminophen **OR** [DISCONTINUED] acetaminophen, albuterol, diclofenac Sodium, guaiFENesin, HYDROcodone-acetaminophen, metoprolol tartrate, ondansetron **OR** ondansetron (ZOFRAN) IV, ondansetron (ZOFRAN) IV, senna-docusate, sodium chloride flush, sodium chloride flush  ? ?Vital Signs  ?  ?Vitals:  ? 02/24/22 0609 02/24/22 0723 02/24/22 1116 02/24/22 1942  ?BP:  135/68 126/69 (!) 155/72  ?Pulse:  80 63 79  ?Resp:  15 17 17   ?Temp: 98.5 ?F (36.9 ?C) (!) 97.3 ?F (36.3 ?C)  97.6 ?F (36.4 ?C)  ?TempSrc: Oral Oral  Oral  ?SpO2:  92% 95% 93%  ?Weight:      ?Height:      ? ? ?Intake/Output Summary (Last 24 hours) at 02/25/2022 0742 ?Last data filed at 02/25/2022 0700 ?Gross per 24 hour  ?Intake 859.34 ml  ?Output 2375 ml  ?Net -1515.66 ml  ? ?Filed Weights  ? 02/19/22 0605 02/20/22 0320 02/22/22 0036  ?Weight: (!) 178.5 kg (!) 178.7 kg (!) 177.8 kg  ? ? ?Telemetry  ?  ?Atrial fib with controlled  ventricular rate- Personally Reviewed ? ?ECG  ?  ?NA - Personally Reviewed ? ?Physical Exam  ? ?GEN: No  acute distress.   ?Neck: No  JVD ?Cardiac: Irregular RR, no murmurs, rubs, or gallops.  ?Respiratory: Clear   to auscultation bilaterally. ?GI: Soft, nontender, non-distended, normal bowel sounds  ?MS:  Massive leg and scrotal edema; No deformity. ?Neuro:   Nonfocal  ?Psych: Oriented and appropriate  ? ? ?Labs  ?  ?Chemistry ?Recent Labs  ?Lab 02/20/22 ?[id number] 02/21/22 ?[id number] 02/23/22 ?0440 02/24/22 ?0530 02/25/22 ?0440  ?NA 135   < > 135 134* 134*  ?K 3.8   < > 3.6 3.8 4.0  ?CL 96*   < > 94* 92* 91*  ?CO2 31   < > 32 31 33*  ?GLUCOSE 139*   < > 183* 193* 187*  ?BUN 50*   < > 71* 79* 86*  ?CREATININE 1.89*   < > 1.88* 1.74* 1.92*  ?CALCIUM 8.3*   < > 8.6* 8.6* 8.8*  ?PROT 6.4*  --   --   --   --   ?ALBUMIN 2.9*  --   --   --   --   ?AST 17  --   --   --   --   ?ALT 17  --   --   --   --   ?  ALKPHOS 63  --   --   --   --   ?BILITOT 1.1  --   --   --   --   ?GFRNONAA 38*   < > 38* 42* 37*  ?ANIONGAP 8   < > 9 11 10   ? < > = values in this interval not displayed.  ?  ? ?Hematology ?Recent Labs  ?Lab 02/20/22 ?[id number] 02/21/22 ?[id number] 02/24/22 ?0530  ?WBC 7.5 8.6 8.5  ?RBC 4.01* 4.21* 4.17*  ?HGB 9.1* 9.5* 9.4*  ?HCT 29.2* 31.0* 30.2*  ?MCV 72.8* 73.6* 72.4*  ?MCH 22.7* 22.6* 22.5*  ?MCHC 31.2 30.6 31.1  ?RDW 18.5* 18.5* 18.6*  ?PLT 210 243 284  ? ? ?Cardiac EnzymesNo results for input(s): TROPONINI in the last 168 hours. No results for input(s): TROPIPOC in the last 168 hours.  ? ?BNPNo results for input(s): BNP, PROBNP in the last 168 hours.  ? ?DDimer No results for input(s): DDIMER in the last 168 hours.  ? ?Radiology  ?  ?No results found. ? ?Cardiac Studies  ? ?Right heart cath:  ? ?RA = 18 ?RV = 60/24 ?PA =  63/19 (35) ?PCW = 22  ?Fick cardiac output/index = 10.5/3.8 ?Thermo CO/CI = 7.2/2.6 ?PVR = 1.9 WU (Thermo) ?FA sat = 92% ?PA sat = 63%, 65% ?High SVC = 68% ?PAPi = 2.4 ?  ?Echo:   ? ? 1. Left ventricular ejection  fraction, by estimation, is 55 to 60%. The  ?left ventricle has normal function. The left ventricle has no regional  ?wall motion abnormalities. There is moderate left ventricular hypertrophy.  ?Left ventricular diastolic function  ? could not be evaluated.  ? 2. Right ventricular systolic function was not well visualized. The right  ?ventricular size is not well visualized.  ? 3. Left atrial size was mildly dilated.  ? 4. The mitral valve is normal in structure. No evidence of mitral valve  ?regurgitation.  ? 5. The aortic valve is normal in structure. There is mild calcification  ?of the aortic valve. Aortic valve regurgitation is trivial.  ? ?Patient Profile  ?   ?71 y.o. male with a hx of chronic atrial fibrillatin, chronic combined CHF, HTN, DM type 2, CKD stage 3a, RLS, OSA who is being seen 02/14/2022 for the evaluation of CHF at the request of Dr. Reesa Chew. ? ?Assessment & Plan  ?  ? ?Acute on chronic diastolic heart failure: Right heart cath with evidence of significant volume overload.  Net negative appears to be 5.9 L.  Weight appears to be down an1.5 L's negative yesterday continue lasix drip  and zaroxyln   I gave him Zaroxolyn again yesterday.  Creatinine is tolerating this aggressive diuresis.  CVP remains low.   .   ? ?AoCKD 3: Creatinine near baseline 1.92  ? ?Chronic Afib:   Rate controlled.  Continue Xarelto and Cardizem.   ? ?Bilateral LE cellulitis: Management per primary team. ? ?For questions or updates, please contact [place] ?Please consult [email or web address] for contact info under Cardiology/STEMI. ?  ?Signed, ?Jenkins Rouge, MD  ?02/25/2022, 7:42 AM   ? ?

## 2022-02-25 NOTE — Progress Notes (Signed)
Heart Failure Navigator Progress Note ? ?Following this hospitalization to assess for HV TOC readiness.  ? ?Continue to follow, IV lasix due to fluid overload. Unknown discharge date at this time.  ? ? ?Earnestine Leys, BSN, RN ?Heart Failure Nurse Navigator ?Secure Chat Only  ?

## 2022-02-25 NOTE — TOC Progression Note (Signed)
Transition of Care (TOC) - Progression Note  ? ? ?Patient Details  ?Name: [name] ?MRN: [id number] ?Date of Birth: 30-Dec-1950 ? ?Transition of Care (TOC) CM/SW Contact  ?Elaf Clauson Wynn Banker, LCSWA ?Phone Number: ?02/25/2022, 1:20 PM ? ?Clinical Narrative:    ?CSW spoke with pt about having one bed offer and informing pt of inpatient rehab option in [place]. Pt preferred [place] bc he is able to have therapies for more days. Pt asked about any other option, CSW followed up with [facility], Olegario Messier is reviewing pt and will follow up with CSW once decision is made. ? ? ?Expected Discharge Plan: Skilled Nursing Facility ?Barriers to Discharge: Continued Medical Work up ? ?Expected Discharge Plan and Services ?Expected Discharge Plan: Skilled Nursing Facility ?In-house Referral: NA ?Discharge Planning Services: CM Consult ?  ?Living arrangements for the past 2 months: Hotel/Motel ?                ?  ?DME Agency: NA ?  ?  ?  ?  ?  ?  ?  ?  ? ? ?Social Determinants of Health (SDOH) Interventions ?  ? ?Readmission Risk Interventions ? ?  02/15/2022  ?  2:32 PM  ?Readmission Risk Prevention Plan  ?Transportation Screening Complete  ?PCP or Specialist Appt within 5-7 Days Complete  ? ? ?

## 2022-02-25 NOTE — Progress Notes (Signed)
Occupational Therapy Treatment ?Patient Details ?Name: [name] ?MRN: [id number] ?DOB: [birth year] ?Today's Date: 02/25/2022 ? ? ?History of present illness Patient is 71 y.o. male presented to the ER on 02/13/2022 with weight gain and hypoxia. Pt with acute on chronic chf and UTI. Transferred to [facility] on 4/17 and underwent heart cath on 4/18.PMH significant for A-fib, HFpEF, HTN, DMII, morbid obesity, venous stasis, lymphedema. ?  ?OT comments ? Session focused on focus on improving positioning and comfort in recliner, continues to decline offers to transfer to bed (despite being under wt limit) or to bari recliner (despite its wider surface for better fitting the pt's girth). Pt able to stand with Max A x 2 briefly but unable to progress to taking steps today, likely also impacted by fear of falling. Educated on pressure relief strategies for bottom and frequent repositioning due to prolonged time in recliner. Encouraged elevation of B LE though pt unable to lift footrest with current recliner setup. Pt also reports difficulty standing due to scrotal edema. Plan to assess for scrotal sling in next OT session. Continue to rec SNF rehab as pt is well below functional baseline (was initially ambulating at [facility] prior to [facility] transfer).   ? ?Recommendations for follow up therapy are one component of a multi-disciplinary discharge planning process, led by the attending physician.  Recommendations may be updated based on patient status, additional functional criteria and insurance authorization. ?   ?Follow Up Recommendations ? Skilled nursing-short term rehab (<3 hours/day)  ?  ?Assistance Recommended at Discharge Frequent or constant Supervision/Assistance  ?Patient can return home with the following ? Two people to help with walking and/or transfers;Two people to help with bathing/dressing/bathroom ?  ?Equipment Recommendations ? Other (comment) (TBD pending progress)  ?  ?Recommendations for Other Services    ? ?  ?Precautions / Restrictions Precautions ?Precautions: Fall ?Precaution Comments: significant wounds BLE ?Restrictions ?Weight Bearing Restrictions: No  ? ? ?  ? ?Mobility Bed Mobility ?  ?  ?  ?  ?  ?  ?  ?General bed mobility comments: Pt in recliner ?  ? ?Transfers ?Overall transfer level: Needs assistance ?Equipment used: Rollator (4 wheels) ?Transfers: Sit to/from Stand ?Sit to Stand: Max assist, +2 physical assistance, +2 safety/equipment ?  ?  ?  ?  ?  ?General transfer comment: maxA of 2 to stand with +3 to arrange equipment or place pillow/pads under the pt. completed x2 with hold of gait belt and chuck pad under pt to assist with hip extension and lift from recliner. pt declines use of UE on armrests, instead opting for UE on rollator and attempting to push up from there, increased time and effort to rise with poor knee extension and hip extension ?  ?  ?Balance Overall balance assessment: Needs assistance ?Sitting-balance support: Feet supported ?Sitting balance-Leahy Scale: Fair ?  ?  ?Standing balance support: Bilateral upper extremity supported, Reliant on assistive device for balance, During functional activity ?Standing balance-Leahy Scale: Poor ?Standing balance comment: BUE + modA to maintain static standing ?  ?  ?  ?  ?  ?  ?  ?  ?  ?  ?  ?   ? ?ADL either performed or assessed with clinical judgement  ? ?ADL Overall ADL's : Needs assistance/impaired ?  ?  ?  ?  ?  ?  ?  ?  ?  ?  ?  ?  ?  ?  ?  ?  ?  ?  ?  ?  General ADL Comments: Focus on continued efforts for optimal bariatric DME, positioning and progression of mobility to facilite LB ADL completion ?  ? ?Extremity/Trunk Assessment Upper Extremity Assessment ?Upper Extremity Assessment: LUE deficits/detail ?LUE Deficits / Details: [facility] ?  ?Lower Extremity Assessment ?Lower Extremity Assessment: Defer to PT evaluation ?  ?  ?  ? ?Vision   ?Vision Assessment?: No apparent visual  deficits ?  ?Perception   ?  ?Praxis   ?  ? ?Cognition Arousal/Alertness: Awake/alert ?[facility] for tasks assessed/performed ?Overall Cognitive Status: Within Functional Limits for tasks assessed ?  ?  ?  ?  ?  ?  ?  ?  ?  ?  ?  ?  ?  ?  ?  ?  ?General Comments: pt at times declining mobility, but responds well with encouragement, seems to be fearful of movement ?  ?  ?   ?Exercises   ? ?  ?Shoulder Instructions   ? ? ?  ?General Comments VSS  ? ? ?Pertinent Vitals/ Pain       Pain Assessment ?Pain Assessment: Faces ?Faces Pain Scale: Hurts even more ?Pain Location: bilateral BLE, scrotum due to swelling ?Pain Descriptors / Indicators: Grimacing, Guarding ?Pain Intervention(s): Monitored during session, Limited activity within patient's tolerance, Repositioned ? ?Home Living   ?  ?  ?  ?  ?  ?  ?  ?  ?  ?  ?  ?  ?  ?  ?  ?  ?  ?  ? ?  ?Prior Functioning/Environment    ?  ?  ?  ?   ? ?Frequency ? Min 2X/week  ? ? ? ? ?  ?Progress Toward Goals ? ?OT Goals(current goals can now be found in the care plan section) ? Progress towards OT goals: Progressing toward goals ? ?Acute Rehab OT Goals ?Patient Stated Goal: avoid falls, decrease fluid ?OT Goal Formulation: With patient ?Time For Goal Achievement: 03/07/22 ?Potential to Achieve Goals: Good ?ADL Goals ?Pt Will Perform Lower Body Bathing: with mod assist;sit to/from stand;sitting/lateral leans ?Pt Will Perform Lower Body Dressing: with mod assist;sit to/from stand;sitting/lateral leans ?Pt Will Transfer to Toilet: with max assist;with +2 assist;stand pivot transfer;bedside commode ?Pt/caregiver will Perform Home Exercise Program: Increased strength;Both right and left upper extremity;With theraband;Independently;With written HEP provided  ?Plan Discharge plan remains appropriate   ? ?Co-evaluation ? ? ? PT/OT/SLP Co-Evaluation/Treatment: Yes ?Reason for Co-Treatment: For patient/therapist safety;To address functional/ADL transfers ?PT goals  addressed during session: Mobility/safety with mobility;Balance;Strengthening/ROM ?OT goals addressed during session: ADL's and self-care ?  ? ?  ?AM-PAC OT "6 Clicks" Daily Activity     ?Outcome Measure ? ? Help from another person eating meals?: None ?Help from another person taking care of personal grooming?: A Little ?Help from another person toileting, which includes using toliet, bedpan, or urinal?: Total ?Help from another person bathing (including washing, rinsing, drying)?: A Lot ?Help from another person to put on and taking off regular upper body clothing?: A Little ?Help from another person to put on and taking off regular lower body clothing?: Total ?6 Click Score: 14 ? ?  ?End of Session Equipment Utilized During Treatment: Rollator (4 wheels);Gait belt ? ?OT Visit Diagnosis: Unsteadiness on feet (R26.81);Other abnormalities of gait and mobility (R26.89) ?  ?Activity Tolerance Patient tolerated treatment well ?  ?Patient Left in chair;with call bell/phone within reach ?  ?Nurse Communication Mobility  status ?  ? ?   ? ?Time: 6644-0347 ?OT Time Calculation (min): 27 min ? ?Charges: OT General Charges ?$OT Visit: 1 Visit ?OT Treatments ?$Therapeutic Activity: 8-22 mins ? ?Bradd Canary, OTR/L ?Acute Rehab Services ?Office: [phone]  ? ?Lorre Munroe ?02/25/2022, 3:03 PM ?

## 2022-02-25 NOTE — Progress Notes (Signed)
Physical Therapy Treatment ?Patient Details ?Name: [name] ?MRN: [id number] ?DOB: August 13, 1951 ?Today's Date: 02/25/2022 ? ? ?History of Present Illness Patient is 71 y.o. male presented to the ER on 02/13/2022 with weight gain and hypoxia. Pt with acute on chronic chf and UTI. Transferred to [facility] on 4/17 and underwent heart cath on 4/18.PMH significant for A-fib, HFpEF, HTN, DMII, morbid obesity, venous stasis, lymphedema. ? ?  ?PT Comments  ? ? The pt was agreeable to session with focus on improving positioning and comfort in recliner, continues to decline offers to transfer to bed (despite being under wt limit) or to bari recliner (despite its wider surface for better fitting the pt's girth). The pt was agreeable to standing with use of rollator, continues to need maxA of 2 with use of gait belt and hold on chuck pad under the pt's hips to assist the pt to standing position. He completed x2 with assist, able to maintain standing for 5-15 seconds with modA of 2. The pt continues to present with significant deficits in LE strength, power, AROM, and endurance. Was educated in AROM exercises to complete outside of therapy sessions to improve mobility and activity outside of sessions. Will continue to benefit from max skilled PT as pt with recent decline in mobility as the pt had been walking 2 x 40 ft with minG on 4/13.  ?  ?Recommendations for follow up therapy are one component of a multi-disciplinary discharge planning process, led by the attending physician.  Recommendations may be updated based on patient status, additional functional criteria and insurance authorization. ? ?Follow Up Recommendations ? Skilled nursing-short term rehab (<3 hours/day) ?  ?  ?Assistance Recommended at Discharge Frequent or constant Supervision/Assistance  ?Patient can return home with the following Two people to help with walking and/or transfers;Two people to help with bathing/dressing/bathroom;Assist for transportation;Help  with stairs or ramp for entrance;Assistance with cooking/housework ?  ?Equipment Recommendations ? None recommended by PT  ?  ?Recommendations for Other Services   ? ? ?  ?Precautions / Restrictions Precautions ?Precautions: Fall ?Precaution Comments: significant wounds BLE ?Restrictions ?Weight Bearing Restrictions: No  ?  ? ?Mobility ? Bed Mobility ?  ?  ?  ?  ?  ?  ?  ?General bed mobility comments: Pt in recliner ?  ? ?Transfers ?Overall transfer level: Needs assistance ?Equipment used: Rollator (4 wheels) ?Transfers: Sit to/from Stand ?Sit to Stand: Max assist, +2 physical assistance, +2 safety/equipment ?  ?  ?  ?  ?  ?General transfer comment: maxA of 2 to stand with +3 to arrange equipment or place pillow/pads under the pt. completed x2 with hold of gait belt and chuck pad under pt to assist with hip extension and lift from recliner. pt declines use of UE on armrests, instead opting for UE on rollator and attempting to push up from there, increased time and effort to rise with poor knee extension and hip extension ?  ? ?Ambulation/Gait ?  ?  ?  ?  ?  ?  ?  ?General Gait Details: pt only able to stand briefly, no steps ? ? ? ? ?  ?Balance Overall balance assessment: Needs assistance ?Sitting-balance support: Feet supported ?Sitting balance-Leahy Scale: Fair ?  ?  ?Standing balance support: Bilateral upper extremity supported, Reliant on assistive device for balance, During functional activity ?Standing balance-Leahy Scale: Poor ?Standing balance comment: BUE + modA to maintain static standing ?  ?  ?  ?  ?  ?  ?  ?  ?  ?  ?  ?  ? ?  ?  Cognition Arousal/Alertness: Awake/alert ?Behavior During Therapy: [facility] for tasks assessed/performed ?Overall Cognitive Status: Within Functional Limits for tasks assessed ?  ?  ?  ?  ?  ?  ?  ?  ?  ?  ?  ?  ?  ?  ?  ?  ?General Comments: pt at times declining mobility, but responds well with encouragement, seems to be fearful of movement ?  ?  ? ?  ?Exercises General Exercises -  Lower Extremity ?Ankle Circles/Pumps: AROM, Both, 10 reps, Seated ?Long Arc Quad: AROM, Both, 5 reps, Seated ?Hip Flexion/Marching: AROM, Both, 10 reps, Seated ?Heel Raises: AROM, Both, 5 reps, Seated ? ?  ?General Comments General comments (skin integrity, edema, etc.): VSS on RA, pt with significant wounds BLE ?  ?  ? ?Pertinent Vitals/Pain Pain Assessment ?Pain Assessment: Faces ?Faces Pain Scale: Hurts even more ?Pain Location: bilateral BLE, scrotum due to swelling ?Pain Descriptors / Indicators: Grimacing, Guarding ?Pain Intervention(s): Limited activity within patient's tolerance, Monitored during session, Repositioned  ? ? ? ?PT Goals (current goals can now be found in the care plan section) Acute Rehab PT Goals ?Patient Stated Goal: get rollator brakes fixed and get rid of fluid ?PT Goal Formulation: With patient ?Time For Goal Achievement: 03/06/22 ?Potential to Achieve Goals: Fair ?Progress towards PT goals: Progressing toward goals ? ?  ?Frequency ? ? ? Min 3X/week ? ? ? ?  ?PT Plan Current plan remains appropriate  ? ? ?Co-evaluation PT/OT/SLP Co-Evaluation/Treatment: Yes ?Reason for Co-Treatment: For patient/therapist safety;To address functional/ADL transfers ?PT goals addressed during session: Mobility/safety with mobility;Balance;Strengthening/ROM ?  ?  ? ?  ?AM-PAC PT "6 Clicks" Mobility   ?Outcome Measure ? Help needed turning from your back to your side while in a flat bed without using bedrails?: Total ?Help needed moving from lying on your back to sitting on the side of a flat bed without using bedrails?: Total ?Help needed moving to and from a bed to a chair (including a wheelchair)?: Total ?Help needed standing up from a chair using your arms (e.g., wheelchair or bedside chair)?: Total ?Help needed to walk in hospital room?: Total ?Help needed climbing 3-5 steps with a railing? : Total ?6 Click Score: 6 ? ?  ?End of Session Equipment Utilized During Treatment: Oxygen ?Activity Tolerance:  Patient limited by fatigue ?Patient left: with call bell/phone within reach;in chair;with nursing/sitter in room ?Nurse Communication: Mobility status ?PT Visit Diagnosis: Unsteadiness on feet (R26.81);Muscle weakness (generalized) (M62.81);Difficulty in walking, not elsewhere classified (R26.2) ?  ? ? ?Time: 9211-9417 ?PT Time Calculation (min) (ACUTE ONLY): 28 min ? ?Charges:  $Therapeutic Exercise: 8-22 mins          ?          ? ?Vickki Muff, PT, DPT  ? ?Acute Rehabilitation Department ?Pager #: [phone] - [phone] ? ? ?Ronnie Derby ?02/25/2022, 2:53 PM ? ?

## 2022-02-26 DIAGNOSIS — N179 Acute kidney failure, unspecified: Secondary | ICD-10-CM | POA: Diagnosis not present

## 2022-02-26 DIAGNOSIS — I4819 Other persistent atrial fibrillation: Secondary | ICD-10-CM | POA: Diagnosis not present

## 2022-02-26 DIAGNOSIS — I5033 Acute on chronic diastolic (congestive) heart failure: Secondary | ICD-10-CM | POA: Diagnosis not present

## 2022-02-26 DIAGNOSIS — E1142 Type 2 diabetes mellitus with diabetic polyneuropathy: Secondary | ICD-10-CM | POA: Diagnosis not present

## 2022-02-26 LAB — BASIC METABOLIC PANEL
Anion gap: 12 (ref 5–15)
BUN: 91 mg/dL — ABNORMAL HIGH (ref 8–23)
CO2: 33 mmol/L — ABNORMAL HIGH (ref 22–32)
Calcium: 8.7 mg/dL — ABNORMAL LOW (ref 8.9–10.3)
Chloride: 86 mmol/L — ABNORMAL LOW (ref 98–111)
Creatinine, Ser: 1.91 mg/dL — ABNORMAL HIGH (ref 0.61–1.24)
GFR, Estimated: 37 mL/min — ABNORMAL LOW (ref >60)
Glucose, Bld: 227 mg/dL — ABNORMAL HIGH (ref 70–99)
Potassium: 3.7 mmol/L (ref 3.5–5.1)
Sodium: 131 mmol/L — ABNORMAL LOW (ref 135–145)

## 2022-02-26 LAB — COOXEMETRY PANEL
Carboxyhemoglobin: 1.8 % — ABNORMAL HIGH (ref 0.5–1.5)
Methemoglobin: <0.7 % (ref 0.0–1.5)
O2 Saturation: 52.7 %
Total hemoglobin: 10.3 g/dL — ABNORMAL LOW (ref 12.0–16.0)

## 2022-02-26 LAB — GLUCOSE, CAPILLARY
Glucose-Capillary: 230 mg/dL — ABNORMAL HIGH (ref 70–99)
Glucose-Capillary: 201 mg/dL — ABNORMAL HIGH (ref 70–99)
Glucose-Capillary: 175 mg/dL — ABNORMAL HIGH (ref 70–99)
Glucose-Capillary: 213 mg/dL — ABNORMAL HIGH (ref 70–99)

## 2022-02-26 MED ORDER — ACETAZOLAMIDE 250 MG PO TABS
250.0000 mg | ORAL_TABLET | Freq: Every day | ORAL | Status: DC
  Administered 2022-02-26 – 2022-03-04 (×7): 250 mg via ORAL
  Filled 2022-02-26 (×7): qty 1

## 2022-02-26 MED ORDER — INSULIN ASPART PROT & ASPART (70-30 MIX) 100 UNIT/ML ~~LOC~~ SUSP
23.0000 [IU] | Freq: Two times a day (BID) | SUBCUTANEOUS | Status: DC
  Administered 2022-02-26: 23 [IU] via SUBCUTANEOUS
  Filled 2022-02-26: qty 10

## 2022-02-26 MED ORDER — IRBESARTAN 300 MG PO TABS
300.0000 mg | ORAL_TABLET | Freq: Every day | ORAL | Status: DC
  Administered 2022-02-26: 300 mg via ORAL
  Filled 2022-02-26: qty 1

## 2022-02-26 NOTE — Progress Notes (Signed)
?Progress Note ? ? ?Patient: [name] [id number] DOB: 08/06/1951 DOA: 02/13/2022     13 ?DOS: the patient was seen and examined on 02/26/2022 ?  ?Brief hospital course: ?Mr. Ferran was admitted to the hospital with the working diagnosis of decompensated heart failure.  ? ?71 yo male with the past medical history of atrial fibrillation, hypertension, heart failure, T2DM and obesity who presented with dyspnea and weight gain. Reported progressive weight gain over 4 weeks, 25 lbs weight gain,  associated with lower extremity edema and dyspnea. On his initial physical examination his blood pressure was 129/50, HR 60, RR 20 and 02 saturation 80's on room air. Lungs with decreased breath sounds bilaterally, hearat with S1 and S2 present irregularly irregular, abdomen protuberant, positive lower extremity edema +++ bilaterally.  ? ?Na 138, K 4,7 Cl 105, bicarbonate 25, glucose 124 bun 29 cr 1,80 ?BNP 293  ?High sensitive troponin 9 and 9  ?Wbc 6,8 hgb 10 plt 235  ?Sars covid 19 negative  ? ?Chest radiograph with cardiomegaly and bilateral hilar vascular congestion.  ? ?EKG 65 bpm, right axis deviation, right bundle branch block, qtc 514, atrial fibrillation rhythm with no significant ST segment or T wave changes.  ? ?Patient was placed on furosemide for diuresis. ?Placed on antibiotic therapy for urinary tract infection.  ? ?PICC line was placed for further hemodynamic monitoring.  ? ?04/18 transferred to [facility] from [facility] for further cardiac workup ?Cardiac catheterization PCW 22, cardiac output 10,5 and index 3,8.  ?Placed on furosemide drip for further diuresis.  ? ?04/24 patient continue with significant volume overload despite aggressive diuresis.  ?04/25 continue with volume overload.  ? ?Assessment and Plan: ?* Acute on chronic diastolic CHF (congestive heart failure) ([place]) ?Echocardiogram with preserved LV systolic function 55 to [id number] with moderate LVH, not well visualized RV, no significant valvular disease.   ? ?Urine output 3,425 ml over last 24 hrs ?Systolic blood pressure [id number] to 118 mmHg range.  ?Continue to have significant volume overload.  ?V 02 sat 52.7% ? ?Furosemide drip 10 mg per hr.  ?04/20 daily metolazone 2,5 mg  ? ?Continue isosorbide  ? ?Acute hypoxemic respiratory failure due to cardiogenic pulmonary edema. ?02 saturation is 92% on [place] 2 L.min  ?Today patient able to get into the bed and lay supine with head 30 degrees elevation with no dyspnea.  ? ?Atrial fibrillation ([place]) ?Continue with diltiazem for rate control and anticoagulation with rivaroxaban.  ? ?Acute kidney injury superimposed on chronic kidney disease ([place]) ?CKD stage 3b. Hyponatremia. Hypokalemia ? ?Stable serum cr at 1,91, with K at 3,7 and serum bicarbonate at 33. NA 131.  ? ?Diuresis with furosemide drip 10 mg per hr along with daily metolazone  ?No empagliflozin due to suspected urine infection.  ?Continue with spironolactone and will add acetazolamide ? ?Continue close follow up on renal function and electrolytes.  ? ?Hypertension, essential ?Blood pressure has remained stable, continue with isosorbide, irbesartan.  ?Aggressive diuresis.  ? ?DM2 (diabetes mellitus, type 2) ([place]) ?Hyperglycemia.  ? ?Fasting glucose 227 with capillary 271, 230 and 201.  ?Patient used 13 of insulin aspart yesterday. ? ?Plan to increase 70/30 insulin to 23 units and continue with insulin sliding scale.  ?Patient is tolerating po well.  ? ? ?Lymphedema of both lower extremities ?Patient with bilateral feet erythema, dorsal and medial aspect. ?He has completed IV ceftriaxone 5 days for a urinary tract infection (present on admission).  ? ?Plan to continue local care with antifungal.  ?  Hold on bacterial antibiotic therapy for now.  ?Patient with dysuria, his urine culture had 40,000 CFU multiple species.  ? ?Repeat urine analysis with no signs of infection, continue to hold on antibiotic therapy.  ? ? ?Class 3 obesity (HCC) ?Calculated BMI is 56.3.  ? ? ? ? ?   ? ?Subjective: Patient able to lay in bed today with no dyspnea, continue to have lower extremity edema and poor mobility  ? ?Physical Exam: ?Vitals:  ? 02/25/22 2025 02/25/22 2026 02/26/22 0340 02/26/22 1110  ?BP: (!) 142/60  136/71 118/65  ?Pulse: 80  76 67  ?Resp:  18 19 20   ?Temp: 97.7 ?F (36.5 ?C)  98 ?F (36.7 ?C) 97.7 ?F (36.5 ?C)  ?TempSrc: Oral  Oral   ?SpO2:  90% 90% 92%  ?Weight:      ?Height:      ? ?Neurology mild somnolent but easy to arouse, no confusion or disorientation  ?ENT with no pallor ?Cardiovascular with S1 and S2 present and rhythmic with no gallops, rubs or murmurs ?No JVD (wide neck) ?Positive lower extremity edema +++ pitting, feet with wraps in place ?Respiratory with no wheezing or rhonchi on anterior auscultation  ?Abdomen protuberant but not tender or distended  ?Data Reviewed: ? ? ? ?Family Communication: no family at the bedside  ? ?Disposition: ?Status is: Inpatient ?Remains inpatient appropriate because: heart failure  ? Planned Discharge Destination: Skilled nursing facility ? ?Author: ?Tawni Millers, MD ?02/26/2022 3:19 PM ? ?For on call review [email or web address].  ?

## 2022-02-26 NOTE — Progress Notes (Signed)
Inpatient Diabetes Program Recommendations ? ?AACE/ADA: New Consensus Statement on Inpatient Glycemic Control (2015) ? ?Target Ranges:  Prepandial:   less than 140 mg/dL ?     Peak postprandial:   less than 180 mg/dL (1-2 hours) ?     Critically ill patients:  140 - 180 mg/dL  ? ?Lab Results  ?Component Value Date  ? GLUCAP 230 (H) 02/26/2022  ? HGBA1C 8.4 (H) 01/09/2022  ? ? ?Review of Glycemic Control ? Latest Reference Range & Units 02/25/22 06:30 02/25/22 11:02 02/25/22 15:42 02/25/22 20:53 02/26/22 06:22  ?Glucose-Capillary 70 - 99 mg/dL 790 (H) 240 (H) 973 (H) 272 (H) 230 (H)  ? ?Diabetes history: DM 2 ?Outpatient Diabetes medications: 75/25 20-34 units bid per SSI, Metformin 1000 mg bid ?Current orders for Inpatient glycemic control:  ?70/30 20 units bid ?Novolog 0-15 units tid + hs ? ?Inpatient Diabetes Program Recommendations:   ? ?-  Increase 70/30 to 22 -24 units bid ? ?Thanks, ? ?Christena Deem RN, MSN, BC-ADM ?Inpatient Diabetes Coordinator ?Team Pager [phone] (8a-5p) ?

## 2022-02-26 NOTE — Progress Notes (Signed)
Occupational Therapy Treatment ?Patient Details ?Name: [name] ?MRN: Terrell:[id number] ?DOB: 04/15/1951 ?Today's Date: 02/26/2022 ? ? ?History of present illness 71 y.o. male presented to the ER on 02/13/2022 with weight gain and hypoxia. Pt with acute on chronic chf and UTI. Transfer[facility] on 4/17 and underwent heart cath on 4/18.PMH significant for A-fib, HFpEF, HTN, DMII, morbid obesity, venous stasis, lymphedema. ?  ?OT comments ? Focused session on fabrication of sling to address scrotal edema. Fabricating custom scrotal sling to provide elevation and compression. Pt reporting comfort with sling wear. Rn present and providing education on sling wear and schedule. Will return for checking sling wear. Continue to recommend dc to SNF.   ? ?Recommendations for follow up therapy are one component of a multi-disciplinary discharge planning process, led by the attending physician.  Recommendations may be updated based on patient status, additional functional criteria and insurance authorization. ?   ?Follow Up Recommendations ? Skilled nursing-short term rehab (<3 hours/day)  ?  ?Assistance Recommended at Discharge Frequent or constant Supervision/Assistance  ?Patient can return home with the following ? Two people to help with walking and/or transfers;Two people to help with bathing/dressing/bathroom ?  ?Equipment Recommendations ? Other (comment)  ?  ?Recommendations for Other Services   ? ?  ?Precautions / Restrictions Precautions ?Precautions: Fall ?Precaution Comments: significant wounds BLE, scrotal swelling ?Restrictions ?Weight Bearing Restrictions: No  ? ? ?  ? ?Mobility Bed Mobility ?  ?  ?  ?  ?  ?  ?  ?General bed mobility comments: Pt in recliner ?  ? ?Transfers ?  ?  ?  ?  ?  ?  ?  ?  ?  ?  ?  ?  ?Balance Overall balance assessment: Needs assistance ?Sitting-balance support: Feet supported ?Sitting balance-Leahy Scale: Fair ?  ?  ?Standing balance support: Bilateral upper extremity supported, [place]  on assistive device for balance, During functional activity ?Standing balance-Leahy Scale: Poor ?  ?  ?  ?  ?  ?  ?  ?  ?  ?  ?  ?  ?   ? ?ADL either performed or assessed with clinical judgement  ? ?ADL Overall ADL's : Needs assistance/impaired ?  ?  ?  ?  ?  ?  ?  ?  ?  ?  ?  ?  ?  ?  ?  ?  ?  ?  ?  ?General ADL Comments: Focused session on fabrication of scrotal sling for edema management. Providing education on edema management with compression, elevation, and mobility. Fabricated custom scrotal sling; education patient and RN on management and wear schedule. ?  ? ?Extremity/Trunk Assessment Upper Extremity Assessment ?Upper Extremity Assessment: Generalized weakness;LUE deficits/detail ?LUE Deficits / Details: reports feeling like he injured L shoulder when pulling on bar at [facility] ?  ?Lower Extremity Assessment ?Lower Extremity Assessment: Defer to PT evaluation ?  ?  ?  ? ?Vision   ?Vision Assessment?: No apparent visual deficits ?  ?Perception   ?  ?Praxis   ?  ? ?Cognition Arousal/Alertness: Awake/alert ?Behavior During Therapy: [facility] for tasks assessed/performed ?Overall Cognitive Status: Within Functional Limits for tasks assessed ?  ?  ?  ?  ?  ?  ?  ?  ?  ?  ?  ?  ?  ?  ?  ?  ?  ?  ?  ?   ?Exercises   ? ?  ?Shoulder Instructions   ? ? ?  ?General  Comments VSS on RA. Readjusted scrotal sling, scrotum in bed with washcloths additionally placed for lift. NT present and participatory in education/readjustment. Anticipate urination will soil sling but d/t difficulty locating penis (hx of issue with primofit, fearful of catheter), appears unavoidable situation  ? ? ?Pertinent Vitals/ Pain       Pain Assessment ?Pain Assessment: Faces ?Faces Pain Scale: Hurts even more ?Pain Location: scrotum, B knees ?Pain Descriptors / Indicators: Grimacing, Guarding ?Pain Intervention(s): Monitored during session, Limited activity within patient's tolerance, Repositioned ? ?Home Living   ?  ?  ?  ?  ?  ?  ?  ?  ?  ?  ?  ?  ?  ?   ?  ?  ?  ?  ? ?  ?Prior Functioning/Environment    ?  ?  ?  ?   ? ?Frequency ? Min 2X/week  ? ? ? ? ?  ?Progress Toward Goals ? ?OT Goals(current goals can now be found in the care plan section) ? Progress towards OT goals: Progressing toward goals ? ?Acute Rehab OT Goals ?Patient Stated Goal: decrease edema, increase comfort, return to independence ?OT Goal Formulation: With patient ?Time For Goal Achievement: 03/07/22 ?Potential to Achieve Goals: Good ?ADL Goals ?Pt Will Perform Lower Body Bathing: with mod assist;sit to/from stand;sitting/lateral leans ?Pt Will Perform Lower Body Dressing: with mod assist;sit to/from stand;sitting/lateral leans ?Pt Will Transfer to Toilet: with max assist;with +2 assist;stand pivot transfer;bedside commode ?Pt/caregiver will Perform Home Exercise Program: Increased strength;Both right and left upper extremity;With theraband;Independently;With written HEP provided  ?Plan Discharge plan remains appropriate   ? ?Co-evaluation ? ? ? PT/OT/SLP Co-Evaluation/Treatment: Yes ?Reason for Co-Treatment: For patient/therapist safety;To address functional/ADL transfers ?  ?OT goals addressed during session: ADL's and self-care;Other (comment) (transfers, scrotal sling) ?  ? ?  ?AM-PAC OT "6 Clicks" Daily Activity     ?Outcome Measure ? ? Help from another person eating meals?: None ?Help from another person taking care of personal grooming?: A Little ?Help from another person toileting, which includes using toliet, bedpan, or urinal?: Total ?Help from another person bathing (including washing, rinsing, drying)?: A Lot ?Help from another person to put on and taking off regular upper body clothing?: A Little ?Help from another person to put on and taking off regular lower body clothing?: Total ?6 Click Score: 14 ? ?  ?End of Session Equipment Utilized During Treatment: Other (comment) (scrotal sling) ? ?OT Visit Diagnosis: Unsteadiness on feet (R26.81);Other abnormalities of gait and mobility  (R26.89) ?  ?Activity Tolerance Patient tolerated treatment well ?  ?Patient Left in bed;with call bell/phone within reach;with bed alarm set;with nursing/sitter in room ?  ?Nurse Communication Mobility status ?  ? ?   ? ?Time: [id number] ?OT Time Calculation (min): 25 min ? ?Charges: OT General Charges ?$OT Visit: 1 Visit ?OT Treatments ?$Therapeutic Activity: 23-37 mins ? ?Aziz Slape MSOT, OTR/L ?Acute Rehab ?Pager: [phone] ?Office: [phone] ? ?Rei Contee M Tarrance Januszewski ?02/26/2022, 12:31 PM ? ? ?

## 2022-02-26 NOTE — Progress Notes (Signed)
? ?Progress Note ? ?Patient Name: [name] ?Date of Encounter: 02/26/2022 ? ?Primary Cardiologist:   Pixie Casino, MD ? ? ?Subjective  ? ?Continues to diurese. Needs to work with PT/OT on mobility Able to use rollator in room  ? ?Inpatient Medications  ?  ?Scheduled Meds: ? Chlorhexidine Gluconate Cloth  6 each Topical Daily  ? diltiazem  120 mg Oral Daily  ? insulin aspart  0-15 Units Subcutaneous TID WC  ? insulin aspart  0-5 Units Subcutaneous QHS  ? insulin aspart protamine- aspart  20 Units Subcutaneous BID WC  ? isosorbide mononitrate  30 mg Oral Daily  ? loratadine  10 mg Oral Daily  ? metolazone  2.5 mg Oral Daily  ? multivitamin with minerals  1 tablet Oral Daily  ? nystatin cream   Topical BID  ? rivaroxaban  20 mg Oral Daily  ? sodium chloride flush  10-40 mL Intracatheter Q12H  ? sodium chloride flush  3 mL Intravenous Q12H  ? spironolactone  12.5 mg Oral Daily  ? terazosin  10 mg Oral QHS  ? ?Continuous Infusions: ? sodium chloride    ? furosemide (LASIX) 200 mg in dextrose 5% 100 mL (2mg /mL) infusion 10 mg/hr (02/26/22 0340)  ? ?PRN Meds: ?sodium chloride, acetaminophen **OR** [DISCONTINUED] acetaminophen, albuterol, diclofenac Sodium, guaiFENesin, HYDROcodone-acetaminophen, metoprolol tartrate, ondansetron **OR** ondansetron (ZOFRAN) IV, ondansetron (ZOFRAN) IV, senna-docusate, sodium chloride flush, sodium chloride flush  ? ?Vital Signs  ?  ?Vitals:  ? 02/25/22 1105 02/25/22 2025 02/25/22 2026 02/26/22 0340  ?BP: 116/71 (!) 142/60  136/71  ?Pulse: 70 80  76  ?Resp: 19  18 19   ?Temp: 97.9 ?F (36.6 ?C) 97.7 ?F (36.5 ?C)  98 ?F (36.7 ?C)  ?TempSrc:  Oral  Oral  ?SpO2: 94%  90% 90%  ?Weight:      ?Height:      ? ? ?Intake/Output Summary (Last 24 hours) at 02/26/2022 0824 ?Last data filed at 02/26/2022 [id number] ?Gross per 24 hour  ?Intake 814.76 ml  ?Output 3450 ml  ?Net -2635.24 ml  ? ?Filed Weights  ? 02/20/22 0320 02/22/22 0036  ?Weight: (!) 178.7 kg (!) 177.8 kg  ? ? ?Telemetry  ?  ?Atrial fib  with controlled ventricular rate- Personally Reviewed ? ?ECG  ?  ?NA - Personally Reviewed ? ?Physical Exam  ? ?GEN: No  acute distress.   ?Neck: No  JVD ?Cardiac: Irregular RR, no murmurs, rubs, or gallops.  ?Respiratory: Clear   to auscultation bilaterally. ?GI: Soft, nontender, non-distended, normal bowel sounds  ?MS:  Massive leg and scrotal edema; No deformity. ?Neuro:   Nonfocal  ?Psych: Oriented and appropriate  ? ? ?Labs  ?  ?Chemistry ?Recent Labs  ?Lab 02/20/22 ?[id number] 02/21/22 ?[id number] 02/24/22 ?0530 02/25/22 ?[id number] 02/26/22 ?[id number]  ?NA 135   < > 134* 134* 131*  ?K 3.8   < > 3.8 4.0 3.7  ?CL 96*   < > 92* 91* 86*  ?CO2 31   < > 31 33* 33*  ?GLUCOSE 139*   < > 193* 187* 227*  ?BUN 50*   < > 79* 86* 91*  ?CREATININE 1.89*   < > 1.74* 1.92* 1.91*  ?CALCIUM 8.3*   < > 8.6* 8.8* 8.7*  ?PROT 6.4*  --   --   --   --   ?ALBUMIN 2.9*  --   --   --   --   ?AST 17  --   --   --   --   ?  ALT 17  --   --   --   --   ?ALKPHOS 63  --   --   --   --   ?BILITOT 1.1  --   --   --   --   ?GFRNONAA 38*   < > 42* 37* 37*  ?ANIONGAP 8   < > 11 10 12   ? < > = values in this interval not displayed.  ?  ? ?Hematology ?Recent Labs  ?Lab 02/20/22 ?[id number] 02/21/22 ?[id number] 02/24/22 ?0530  ?WBC 7.5 8.6 8.5  ?RBC 4.01* 4.21* 4.17*  ?HGB 9.1* 9.5* 9.4*  ?HCT 29.2* 31.0* 30.2*  ?MCV 72.8* 73.6* 72.4*  ?MCH 22.7* 22.6* 22.5*  ?MCHC 31.2 30.6 31.1  ?RDW 18.5* 18.5* 18.6*  ?PLT 210 243 284  ? ? ?Cardiac EnzymesNo results for input(s): TROPONINI in the last 168 hours. No results for input(s): TROPIPOC in the last 168 hours.  ? ?BNPNo results for input(s): BNP, PROBNP in the last 168 hours.  ? ?DDimer No results for input(s): DDIMER in the last 168 hours.  ? ?Radiology  ?  ?No results found. ? ?Cardiac Studies  ? ?Right heart cath:  ? ?RA = 18 ?RV = 60/24 ?PA =  63/19 (35) ?PCW = 22  ?Fick cardiac output/index = 10.5/3.8 ?Thermo CO/CI = 7.2/2.6 ?PVR = 1.9 WU (Thermo) ?FA sat = 92% ?PA sat = 63%, 65% ?High SVC = 68% ?PAPi = 2.4 ?  ?Echo:   ? ? 1. Left  ventricular ejection fraction, by estimation, is 55 to 60%. The  ?left ventricle has normal function. The left ventricle has no regional  ?wall motion abnormalities. There is moderate left ventricular hypertrophy.  ?Left ventricular diastolic function  ? could not be evaluated.  ? 2. Right ventricular systolic function was not well visualized. The right  ?ventricular size is not well visualized.  ? 3. Left atrial size was mildly dilated.  ? 4. The mitral valve is normal in structure. No evidence of mitral valve  ?regurgitation.  ? 5. The aortic valve is normal in structure. There is mild calcification  ?of the aortic valve. Aortic valve regurgitation is trivial.  ? ?Patient Profile  ?   ?71 y.o. male with a hx of chronic atrial fibrillatin, chronic combined CHF, HTN, DM type 2, CKD stage 3a, RLS, OSA who is being seen 02/14/2022 for the evaluation of CHF at the request of Dr. Reesa Chew. ? ?Assessment & Plan  ?  ? ?Acute on chronic diastolic heart failure: Right heart cath with evidence of significant volume overload.  Net negative appears to be 2.6 L's yesterday   Continue lasix drip  and zaroxyln   latter written for daily  .  Na low 131 in setting of elevated BS K 3.7 On aldactone as well  ? ?AoCKD 3: Creatinine near baseline 1.91 ? ?Chronic Afib:   Rate controlled.  Continue Xarelto and Cardizem.   ? ?Bilateral LE cellulitis: Management per primary team. ? ?For questions or updates, please contact [place] ?Please consult [email or web address] for contact info under Cardiology/STEMI. ?  ?Signed, ?Jenkins Rouge, MD  ?02/26/2022, 8:24 AM   ? ?

## 2022-02-26 NOTE — Progress Notes (Signed)
Physical Therapy Treatment ?Patient Details ?Name: [name] ?MRN: [id number] ?DOB: Mar 06, 1951 ?Today's Date: 02/26/2022 ? ? ?History of Present Illness 71 y.o. male presented to the ER on 02/13/2022 with weight gain and hypoxia. Pt with acute on chronic chf and UTI. Transferred to [place] on 4/17 and underwent heart cath on 4/18.PMH significant for A-fib, HFpEF, HTN, DMII, morbid obesity, venous stasis, lymphedema. ? ?  ?PT Comments  ? ? On arrival pt in recliner with feet dependent, excessive edema with weeping and pain in Rt knee despite premedication. Pt with significant difficulty moving or tolerating movement of RLE and assisted with LLE. Pt able to stand with +2 assist x 3 trials, begin weight shifting and return to bed. Pt with education for need to maintain elevation of legs to assist with control of edema and pain. Pt stating he sat in chair with legs down and not moving from 2-9am causing increased edema and stiffness. Pt very motivated but also fearful of falling and benefits from reassurance. Plan remains appropriate with pt encouraged to sit EOB for meals with return to supine for leg elevation.  ? ? ?   ?Recommendations for follow up therapy are one component of a multi-disciplinary discharge planning process, led by the attending physician.  Recommendations may be updated based on patient status, additional functional criteria and insurance authorization. ? ?Follow Up Recommendations ? Skilled nursing-short term rehab (<3 hours/day) ?  ?  ?Assistance Recommended at Discharge Frequent or constant Supervision/Assistance  ?Patient can return home with the following Two people to help with walking and/or transfers;Two people to help with bathing/dressing/bathroom;Assist for transportation;Help with stairs or ramp for entrance;Assistance with cooking/housework ?  ?Equipment Recommendations ? None recommended by PT  ?  ?Recommendations for Other Services   ? ? ?  ?Precautions / Restrictions  Precautions ?Precautions: Fall ?Precaution Comments: significant wounds BLE with weeping, scrotal swelling ?Restrictions ?Weight Bearing Restrictions: No  ?  ? ?Mobility ? Bed Mobility ?Overal bed mobility: Needs Assistance ?Bed Mobility: Sit to Supine ?  ?  ?  ?Sit to supine: Max assist ?  ?General bed mobility comments: max assist to lift bil LE to surface as pt controlled trunk, mod +2 to scoot hips to center of bed ?  ? ?Transfers ?Overall transfer level: Needs assistance ?Equipment used: Rollator (4 wheels), Rolling walker (2 wheels) ?Transfers: Sit to/from Stand ?Sit to Stand: Max assist, +2 physical assistance, +2 safety/equipment ?  ?  ?  ?  ?  ?General transfer comment: maxA + 2 to stand with RW x 1 trials and Rollator x 2. Able to stand 10 sec first trial and 60 sec for second trial while 3rd person switched recliner and bed behind pt. 3rd trial to stand from bed with pt able to weight shift and slightly move bil feet but fatigued after 30 sec requiring return to sitting. Decreased control of descent.2nd and 3rd trial pt with assist to reposition RLE then able to extend trunk with bil UE use ?  ? ?Ambulation/Gait ?  ?  ?  ?  ?  ?  ?  ?General Gait Details: not yet able ? ? ?Stairs ?  ?  ?  ?  ?  ? ? ?Wheelchair Mobility ?  ? ?Modified Rankin (Stroke Patients Only) ?  ? ? ?  ?Balance Overall balance assessment: Needs assistance ?  ?Sitting balance-Leahy Scale: Fair ?Sitting balance - Comments: sitting EOB without assist ?  ?Standing balance support: Bilateral upper extremity supported, Reliant on assistive  device for balance, During functional activity ?Standing balance-Leahy Scale: Poor ?Standing balance comment: RW or rollator for standing ?  ?  ?  ?  ?  ?  ?  ?  ?  ?  ?  ?  ? ?  ?Cognition Arousal/Alertness: Awake/alert ?Behavior During Therapy: [facility] for tasks assessed/performed ?Overall Cognitive Status: Within Functional Limits for tasks assessed ?  ?  ?  ?  ?  ?  ?  ?  ?  ?  ?  ?  ?  ?  ?  ?  ?General  Comments: pt at times fearful with mobility and benefits from reassurance ?  ?  ? ?  ?Exercises General Exercises - Lower Extremity ?Short Arc Quad: AAROM, Left, Seated, 10 reps (PROM RLEx 3) ? ?  ?General Comments General comments (skin integrity, edema, etc.): VSS on RA. Readjusted scrotal sling, scrotum in bed with washcloths additionally placed for lift. NT present and participatory in education/readjustment. Anticipate urination will soil sling but d/t difficulty locating penis (hx of issue with primofit, fearful of catheter), appears unavoidable situation ?  ?  ? ?Pertinent Vitals/Pain Pain Assessment ?Faces Pain Scale: Hurts even more ?Pain Location: scrotum, Rt knee ?Pain Descriptors / Indicators: Grimacing, Guarding ?Pain Intervention(s): Limited activity within patient's tolerance, Monitored during session, Premedicated before session, Repositioned  ? ? ?Home Living   ?  ?  ?  ?  ?  ?  ?  ?  ?  ?   ?  ?Prior Function    ?  ?  ?   ? ?PT Goals (current goals can now be found in the care plan section) Progress towards PT goals: Progressing toward goals ? ?  ?Frequency ? ? ? Min 3X/week ? ? ? ?  ?PT Plan Current plan remains appropriate  ? ? ?Co-evaluation   ?Reason for Co-Treatment: For patient/therapist safety;To address functional/ADL transfers ?  ?OT goals addressed during session: ADL's and self-care;Other (comment) (transfers, scrotal sling) ?  ? ?  ?AM-PAC PT "6 Clicks" Mobility   ?Outcome Measure ? Help needed turning from your back to your side while in a flat bed without using bedrails?: Total ?Help needed moving from lying on your back to sitting on the side of a flat bed without using bedrails?: Total ?Help needed moving to and from a bed to a chair (including a wheelchair)?: Total ?Help needed standing up from a chair using your arms (e.g., wheelchair or bedside chair)?: Total ?Help needed to walk in hospital room?: Total ?Help needed climbing 3-5 steps with a railing? : Total ?6 Click Score:  6 ? ?  ?End of Session Equipment Utilized During Treatment: Gait belt ?Activity Tolerance: Patient limited by fatigue ?Patient left: in bed;with call bell/phone within reach;with nursing/sitter in room ?Nurse Communication: Mobility status ?PT Visit Diagnosis: Unsteadiness on feet (R26.81);Muscle weakness (generalized) (M62.81);Difficulty in walking, not elsewhere classified (R26.2) ?  ? ? ?Time: [id number] ?PT Time Calculation (min) (ACUTE ONLY): 36 min ? ?Charges:  $Therapeutic Activity: 8-22 mins          ?          ? ?Issaac Shipper P, PT ?Acute Rehabilitation Services ?Pager: [phone] ?Office: [phone] ? ? ? ?Tyquasia Pant B Kayle Passarelli ?02/26/2022, 1:30 PM ? ?

## 2022-02-26 NOTE — Progress Notes (Signed)
Consult for occluded PICC. Unable to flush with arm abducted. Line flushes with ease/ brisk blood return when held close to pt's side. RN made aware. ?

## 2022-02-26 NOTE — Progress Notes (Signed)
PT Cancellation Note ? ?Patient Details ?Name: [name] Name ?MRN: [id number] ?DOB: 11-Jun-1951 ? ? ?Cancelled Treatment:    Reason Eval/Treat Not Completed: Pain limiting ability to participate (pt with 8/10 pain bil LE and unable to even tolerate PROM knees to progress function. RN aware of request for meds) ? ? ?Maylene Crocker B Garlen Reinig ?02/26/2022, 9:16 AM ?Nastashia Gallo P, PT ?Acute Rehabilitation Services ?Pager: [phone] ?Office: [phone] ? ?

## 2022-02-26 NOTE — Progress Notes (Signed)
Mobility Specialist Progress Note: ? ? 02/26/22 1250  ?Mobility  ?Activity Dangled on edge of bed  ?Level of Assistance +2 (takes two people)  ?Assistive Device None  ?Activity Response Tolerated fair  ?$Mobility charge 1 Mobility  ? ?Pt requesting to get back in bed after sitting EOB for lunch. Required modA +2 to help legs onto bed d/t cramp in RLE. Pt left with all needs met.  ? ?Anna Kincaid ?Acute Rehab ?Phone: [phone] ?Office Phone: [phone] ? ?

## 2022-02-26 NOTE — Progress Notes (Signed)
Occupational Therapy Treatment ?Patient Details ?Name: [name] ?MRN: [id number] ?DOB: April 07, 1951 ?Today's Date: 02/26/2022 ? ? ?History of present illness Patient is 71 y.o. male presented to the ER on 02/13/2022 with weight gain and hypoxia. Pt with acute on chronic chf and UTI. Transferred to [facility] on 4/17 and underwent heart cath on 4/18.PMH significant for A-fib, HFpEF, HTN, DMII, morbid obesity, venous stasis, lymphedema. ?  ?OT comments ? Session focused on assessment of scrotal sling tolerance and transfer back to bed with PT/mobility. Pt initially hesitant to get into bed (has been in recliner for multiple days), but with education on need for BLE/scrotal elevation for edema control and repositioning to allow knee extension, pt agreeable. With Max A x 2, pt able to complete 2 standing trials and demo some weightshifting while 3rd person assisted with equipment mgmt. Educated re: positioning/elevation of scrotum in bed, frequent repositioning/movement (sitting EOB for meals) to avoid further functional decline.   ? ?Recommendations for follow up therapy are one component of a multi-disciplinary discharge planning process, led by the attending physician.  Recommendations may be updated based on patient status, additional functional criteria and insurance authorization. ?   ?Follow Up Recommendations ? Skilled nursing-short term rehab (<3 hours/day)  ?  ?Assistance Recommended at Discharge Frequent or constant Supervision/Assistance  ?Patient can return home with the following ? Two people to help with walking and/or transfers;Two people to help with bathing/dressing/bathroom ?  ?Equipment Recommendations ? Other (comment)  ?  ?Recommendations for Other Services   ? ?  ?Precautions / Restrictions Precautions ?Precautions: Fall ?Precaution Comments: significant wounds BLE, scrotal swelling ?Restrictions ?Weight Bearing Restrictions: No  ? ? ?  ? ?Mobility Bed Mobility ?  ?  ?  ?  ?  ?  ?  ?General bed  mobility comments: Pt in recliner ?  ? ?Transfers ?Overall transfer level: Needs assistance ?Equipment used: Rolling walker (2 wheels), Rollator (4 wheels) ?Transfers: Sit to/from Stand ?Sit to Stand: Max assist, +2 physical assistance, +2 safety/equipment ?  ?  ?  ?  ?  ?General transfer comment: maxA of 2 to stand with RW and Rollator. Trialed RW first but pt reports preference for personal bari Rollator. Able to stand 10 sec first trial and > 30 sec for second trial while 2rd person switched recliner and bed behind pt. Pt able to demo initiation of weight shifting but difficulty fully lifting B feet off of floor with second standing. Cues throughout for posture ?  ?  ?Balance Overall balance assessment: Needs assistance ?Sitting-balance support: Feet supported ?Sitting balance-Leahy Scale: Fair ?  ?  ?Standing balance support: Bilateral upper extremity supported, Reliant on assistive device for balance, During functional activity ?Standing balance-Leahy Scale: Poor ?  ?  ?  ?  ?  ?  ?  ?  ?  ?  ?  ?  ?   ? ?ADL either performed or assessed with clinical judgement  ? ?ADL Overall ADL's : Needs assistance/impaired ?  ?  ?  ?  ?  ?  ?  ?  ?  ?  ?  ?  ?  ?  ?  ?  ?  ?  ?  ?General ADL Comments: Focus on assessment of scrotal sling tolerance, readjustment with return to bed and functional transfer assessment for return to bed for elevation of B LE/scrotum ?  ? ?Extremity/Trunk Assessment Upper Extremity Assessment ?Upper Extremity Assessment: LUE deficits/detail ?LUE Deficits / Details: reports feeling like he injured L  shoulder when pulling on bar at [facility] ?  ?Lower Extremity Assessment ?Lower Extremity Assessment: Defer to PT evaluation ?  ?  ?  ? ?Vision   ?Vision Assessment?: No apparent visual deficits ?  ?Perception   ?  ?Praxis   ?  ? ?Cognition Arousal/Alertness: Awake/alert ?Behavior During Therapy: [facility] for tasks assessed/performed ?Overall Cognitive Status: Within Functional Limits for tasks assessed ?  ?  ?  ?   ?  ?  ?  ?  ?  ?  ?  ?  ?  ?  ?  ?  ?General Comments: pt at times declining mobility, but responds well with encouragement, seems to be fearful of movement ?  ?  ?   ?Exercises   ? ?  ?Shoulder Instructions   ? ? ?  ?General Comments VSS on RA. Readjusted scrotal sling, scrotum in bed with washcloths additionally placed for lift. NT present and participatory in education/readjustment. Anticipate urination will soil sling but d/t difficulty locating penis (hx of issue with primofit, fearful of catheter), appears unavoidable situation  ? ? ?Pertinent Vitals/ Pain       Pain Assessment ?Pain Assessment: Faces ?Faces Pain Scale: Hurts even more ?Pain Location: scrotum, B knees ?Pain Descriptors / Indicators: Grimacing, Guarding ?Pain Intervention(s): Monitored during session, Premedicated before session, Limited activity within patient's tolerance ? ?Home Living   ?  ?  ?  ?  ?  ?  ?  ?  ?  ?  ?  ?  ?  ?  ?  ?  ?  ?  ? ?  ?Prior Functioning/Environment    ?  ?  ?  ?   ? ?Frequency ? Min 2X/week  ? ? ? ? ?  ?Progress Toward Goals ? ?OT Goals(current goals can now be found in the care plan section) ? Progress towards OT goals: Progressing toward goals ? ?Acute Rehab OT Goals ?Patient Stated Goal: decrease edema, increase comfort, return to independence ?OT Goal Formulation: With patient ?Time For Goal Achievement: 03/07/22 ?Potential to Achieve Goals: Good ?ADL Goals ?Pt Will Perform Lower Body Bathing: with mod assist;sit to/from stand;sitting/lateral leans ?Pt Will Perform Lower Body Dressing: with mod assist;sit to/from stand;sitting/lateral leans ?Pt Will Transfer to Toilet: with max assist;with +2 assist;stand pivot transfer;bedside commode ?Pt/caregiver will Perform Home Exercise Program: Increased strength;Both right and left upper extremity;With theraband;Independently;With written HEP provided  ?Plan Discharge plan remains appropriate   ? ?Co-evaluation ? ? ? PT/OT/SLP Co-Evaluation/Treatment: Yes ?Reason for  Co-Treatment: For patient/therapist safety;To address functional/ADL transfers ?  ?OT goals addressed during session: ADL's and self-care;Other (comment) (transfers, scrotal sling) ?  ? ?  ?AM-PAC OT "6 Clicks" Daily Activity     ?Outcome Measure ? ? Help from another person eating meals?: None ?Help from another person taking care of personal grooming?: A Little ?Help from another person toileting, which includes using toliet, bedpan, or urinal?: Total ?Help from another person bathing (including washing, rinsing, drying)?: A Lot ?Help from another person to put on and taking off regular upper body clothing?: A Little ?Help from another person to put on and taking off regular lower body clothing?: Total ?6 Click Score: 14 ? ?  ?End of Session Equipment Utilized During Treatment: Gait belt;Rolling walker (2 wheels);Rollator (4 wheels) ? ?OT Visit Diagnosis: Unsteadiness on feet (R26.81);Other abnormalities of gait and mobility (R26.89) ?  ?Activity Tolerance Patient tolerated treatment well ?  ?Patient Left in bed;with call bell/phone within reach;with bed alarm set;with nursing/sitter in  room ?  ?Nurse Communication Mobility status ?  ? ?   ? ?Time: 1030-1107 ?OT Time Calculation (min): 37 min ? ?Charges: OT General Charges ?$OT Visit: 1 Visit ?OT Treatments ?$Therapeutic Activity: 8-22 mins ? ?Bradd CanaryJulie B, OTR/L ?Acute Rehab Services ?Office: [phone]  ? ?Kevin MunroeJulie  Arkin [name] ?02/26/2022, 11:25 AM ?

## 2022-02-27 ENCOUNTER — Encounter (HOSPITAL_BASED_OUTPATIENT_CLINIC_OR_DEPARTMENT_OTHER): Payer: Medicare Other | Admitting: Physician Assistant

## 2022-02-27 DIAGNOSIS — I5033 Acute on chronic diastolic (congestive) heart failure: Secondary | ICD-10-CM | POA: Diagnosis not present

## 2022-02-27 LAB — MAGNESIUM: Magnesium: 2.2 mg/dL (ref 1.7–2.4)

## 2022-02-27 LAB — COOXEMETRY PANEL
Carboxyhemoglobin: 1.8 % — ABNORMAL HIGH (ref 0.5–1.5)
Methemoglobin: <0.7 % (ref 0.0–1.5)
O2 Saturation: 49.7 %
Total hemoglobin: 10.5 g/dL — ABNORMAL LOW (ref 12.0–16.0)

## 2022-02-27 LAB — CBC
HCT: 31.8 % — ABNORMAL LOW (ref 39.0–52.0)
Hemoglobin: 10.2 g/dL — ABNORMAL LOW (ref 13.0–17.0)
MCH: 22.6 pg — ABNORMAL LOW (ref 26.0–34.0)
MCHC: 32.1 g/dL (ref 30.0–36.0)
MCV: 70.4 fL — ABNORMAL LOW (ref 80.0–100.0)
Platelets: 341 10*3/uL (ref 150–400)
RBC: 4.52 MIL/uL (ref 4.22–5.81)
RDW: 18.6 % — ABNORMAL HIGH (ref 11.5–15.5)
WBC: 8.6 10*3/uL (ref 4.0–10.5)
nRBC: 0 % (ref 0.0–0.2)

## 2022-02-27 LAB — BASIC METABOLIC PANEL
Anion gap: 11 (ref 5–15)
BUN: 92 mg/dL — ABNORMAL HIGH (ref 8–23)
CO2: 33 mmol/L — ABNORMAL HIGH (ref 22–32)
Calcium: 8.8 mg/dL — ABNORMAL LOW (ref 8.9–10.3)
Chloride: 88 mmol/L — ABNORMAL LOW (ref 98–111)
Creatinine, Ser: 2.22 mg/dL — ABNORMAL HIGH (ref 0.61–1.24)
GFR, Estimated: 31 mL/min — ABNORMAL LOW (ref >60)
Glucose, Bld: 215 mg/dL — ABNORMAL HIGH (ref 70–99)
Potassium: 3.8 mmol/L (ref 3.5–5.1)
Sodium: 132 mmol/L — ABNORMAL LOW (ref 135–145)

## 2022-02-27 LAB — GLUCOSE, CAPILLARY
Glucose-Capillary: 202 mg/dL — ABNORMAL HIGH (ref 70–99)
Glucose-Capillary: 209 mg/dL — ABNORMAL HIGH (ref 70–99)
Glucose-Capillary: 172 mg/dL — ABNORMAL HIGH (ref 70–99)
Glucose-Capillary: 222 mg/dL — ABNORMAL HIGH (ref 70–99)

## 2022-02-27 MED ORDER — FUROSEMIDE 40 MG PO TABS
80.0000 mg | ORAL_TABLET | Freq: Two times a day (BID) | ORAL | Status: DC
  Administered 2022-02-27: 80 mg via ORAL
  Filled 2022-02-27: qty 2

## 2022-02-27 MED ORDER — POTASSIUM CHLORIDE CRYS ER 20 MEQ PO TBCR
40.0000 meq | EXTENDED_RELEASE_TABLET | Freq: Once | ORAL | Status: AC
  Administered 2022-02-27: 40 meq via ORAL
  Filled 2022-02-27: qty 2

## 2022-02-27 MED ORDER — FUROSEMIDE 10 MG/ML IJ SOLN
80.0000 mg | Freq: Two times a day (BID) | INTRAMUSCULAR | Status: DC
  Administered 2022-02-27 – 2022-03-03 (×9): 80 mg via INTRAVENOUS
  Filled 2022-02-27 (×9): qty 8

## 2022-02-27 MED ORDER — INSULIN ASPART PROT & ASPART (70-30 MIX) 100 UNIT/ML ~~LOC~~ SUSP
26.0000 [IU] | Freq: Two times a day (BID) | SUBCUTANEOUS | Status: DC
  Administered 2022-02-27 – 2022-02-28 (×3): 26 [IU] via SUBCUTANEOUS
  Filled 2022-02-27: qty 10

## 2022-02-27 NOTE — Care Management Important Message (Signed)
Important Message ? ?Patient Details  ?Name: [name] ?MRN: [id number] ?Date of Birth: [birth year] ? ? ?Medicare Important Message Given:  Yes ? ? ? ? ?[name] ?02/27/2022, 10:49 AM ?

## 2022-02-27 NOTE — Progress Notes (Signed)
Inpatient Diabetes Program Recommendations ? ?AACE/ADA: New Consensus Statement on Inpatient Glycemic Control (2015) ? ?Target Ranges:  Prepandial:   less than 140 mg/dL ?     Peak postprandial:   less than 180 mg/dL (1-2 hours) ?     Critically ill patients:  140 - 180 mg/dL  ? ?Lab Results  ?Component Value Date  ? GLUCAP 202 (H) 02/27/2022  ? HGBA1C 8.4 (H) 01/09/2022  ? ? ?Review of Glycemic Control ? Latest Reference Range & Units 02/26/22 06:22 02/26/22 11:05 02/26/22 16:07 02/26/22 21:14 02/27/22 06:26  ?Glucose-Capillary 70 - 99 mg/dL 132 (H) 440 (H) 102 (H) 213 (H) 202 (H)  ? ? ?Diabetes history: DM 2 ?Outpatient Diabetes medications: 75/25 20-34 units bid per SSI, Metformin 1000 mg bid ?Current orders for Inpatient glycemic control:  ?70/30 23 units bid ?Novolog 0-15 units tid + hs ? ?Inpatient Diabetes Program Recommendations:   ? ?-  Increase 70/30 to 25 units bid ? ?Thanks, ? ?Christena Deem RN, MSN, BC-ADM ?Inpatient Diabetes Coordinator ?Team Pager [phone] (8a-5p) ?

## 2022-02-27 NOTE — Progress Notes (Signed)
Mobility Specialist Progress Note: ? ? 02/27/22 1215  ?Mobility  ?Activity Dangled on edge of bed  ?Level of Assistance +2 (takes two people)  ?Assistive Device None  ?Activity Response Tolerated well  ?$Mobility charge 1 Mobility  ? ?Pt requesting to sit EOB to urinate. Required modA+2 for bed mobility at this time. Pt left sitting EOB with all needs met.  ? ?Nelta Numbers ?Acute Rehab ?Phone: [phone] ?Office Phone: [phone] ? ?

## 2022-02-27 NOTE — Consult Note (Addendum)
WOC Nurse Follow Up Note: ?Patient receiving care in [facility] 3E29 ?Assistance from [facility] [employer], [place] appreciated.  ?Wound type: Chronic Lymphedema and venous stasis with erythema and edema. Blistering on the RLE just below the knee. Partial thickness open wound on the anterior upper LLE.  ?Dressing procedure/placement/frequency: ?4 layer compression wraps removed. Bilateral LE were cleaned with soap and water.  Sween moisturizing lotion applied to the BLE except the open areas where cut to fit pieces of Aquacel Advantage was applied. Xeroform gauze placed over the partial thickness wound and over the blistering areas. Painful to the patient when the legs were lifted, especially the right side. ABD pads placed in the bend of the dorsal side of each foot at the lateral malleolus area for padding and over the BLE for padding and to hold Aquacel and Xeroform in place. Both legs were then wrapped with Kerlix followed by 4 layer (Profore) compression wraps. These are to be changed once a week on Wednesday. Patient tolerated the procedure well. Premedicated prior to procedure.  ?  ?WOC will see next Wednesday 03/06/22 if he is still inpatient  ?  ?Renaldo Reel Katrinka Blazing, MSN, RN, CMSRN, AGCNS, WTA ?Wound Treatment Associate ?Pager [phone]  ?

## 2022-02-27 NOTE — Progress Notes (Signed)
Occupational Therapy Treatment ?Patient Details ?Name: [name] ?MRN: [id number] ?DOB: 17-Mar-1951 ?Today's Date: 02/27/2022 ? ? ?History of present illness 71 y.o. male presented to the ER on 02/13/2022 with weight gain and hypoxia. Pt with acute on chronic chf and UTI. Transferred to [facility] on 4/17 and underwent heart cath on 4/18.PMH significant for A-fib, HFpEF, HTN, DMII, morbid obesity, venous stasis, lymphedema. ?  ?OT comments ? Scrotal sling check performed this AM with swelling notably improved from yesterday. Pt does report discomfort in scrotal/thigh area. Educated pt to transfer back to bed today (slept in recliner) for elevation of legs/scrotum for edema mgmt. While pt in bed, encouraged removal of sling for hygiene (hand wash and hang up to dry), elevation of scrotum with linens in bed, and collaboration with nursing for powder vs barrier cream for scrotal/thigh skin integrity. Continued to encourage wearing scrotal sling as much as possible and AROM of B LE while up in chair to avoid stiffness. DC recs remain appropriate.   ? ?Recommendations for follow up therapy are one component of a multi-disciplinary discharge planning process, led by the attending physician.  Recommendations may be updated based on patient status, additional functional criteria and insurance authorization. ?   ?Follow Up Recommendations ? Skilled nursing-short term rehab (<3 hours/day)  ?  ?Assistance Recommended at Discharge Frequent or constant Supervision/Assistance  ?Patient can return home with the following ? Two people to help with walking and/or transfers;Two people to help with bathing/dressing/bathroom ?  ?Equipment Recommendations ? Other (comment) (TBD pending progress)  ?  ?Recommendations for Other Services   ? ?  ?Precautions / Restrictions Precautions ?Precautions: Fall ?Precaution Comments: significant wounds BLE with weeping, scrotal swelling ?Restrictions ?Weight Bearing Restrictions: No  ? ? ?  ? ?Mobility  Bed Mobility ?  ?  ?  ?  ?  ?  ?  ?General bed mobility comments: in recliner on arrival ?  ? ?Transfers ?  ?  ?  ?  ?  ?  ?  ?  ?  ?  ?  ?  ?Balance   ?  ?  ?  ?  ?  ?  ?  ?  ?  ?  ?  ?  ?  ?  ?  ?  ?  ?  ?   ? ?ADL either performed or assessed with clinical judgement  ? ?ADL Overall ADL's : Needs assistance/impaired ?  ?  ?  ?  ?  ?  ?  ?  ?  ?  ?  ?  ?  ?  ?  ?  ?  ?  ?  ?General ADL Comments: Focus on scrotal sling check (provided handout for donning/mgmt of sling for pt/staff reference), skin integrirty/comfort collaboration, and encouragement of AROM of BLE to combat stiffness ?  ? ?Extremity/Trunk Assessment Upper Extremity Assessment ?Upper Extremity Assessment: LUE deficits/detail ?LUE Deficits / Details: reports feeling like he injured L shoulder when pulling on bar at [facility] ?  ?Lower Extremity Assessment ?Lower Extremity Assessment: Defer to PT evaluation ?  ?  ?  ? ?Vision   ?Vision Assessment?: No apparent visual deficits ?  ?Perception   ?  ?Praxis   ?  ? ?Cognition Arousal/Alertness: Awake/alert ?Behavior During Therapy: [facility] for tasks assessed/performed ?Overall Cognitive Status: Within Functional Limits for tasks assessed ?  ?  ?  ?  ?  ?  ?  ?  ?  ?  ?  ?  ?  ?  ?  ?  ?  General Comments: pt at times fearful with mobility and benefits from reassurance ?  ?  ?   ?Exercises   ? ?  ?Shoulder Instructions   ? ? ?  ?General Comments Pt reporting rawness on scrotum/thigh, noted scrotal sling soaked with urine (expected due to urinating in bucket, unable to visualize penis with swollen scrotum). Educated pt on removal of sling for hygiene while pt in bed, elevating scrotum with washcloths/towels in bed, and barrier cream vs powder for skin - collab with nursing for further mgmt  ? ? ?Pertinent Vitals/ Pain       Pain Assessment ?Pain Assessment: Faces ?Faces Pain Scale: Hurts even more ?Pain Location: L side of scrotum/L thigh ?Pain Descriptors / Indicators: Grimacing, Guarding ?Pain Intervention(s): Monitored  during session, Limited activity within patient's tolerance, Repositioned ? ?Home Living   ?  ?  ?  ?  ?  ?  ?  ?  ?  ?  ?  ?  ?  ?  ?  ?  ?  ?  ? ?  ?Prior Functioning/Environment    ?  ?  ?  ?   ? ?Frequency ? Min 2X/week  ? ? ? ? ?  ?Progress Toward Goals ? ?OT Goals(current goals can now be found in the care plan section) ? Progress towards OT goals: OT to reassess next treatment ? ?Acute Rehab OT Goals ?Patient Stated Goal: decrease swelling and improve mobility/comfort ?OT Goal Formulation: With patient ?Time For Goal Achievement: 03/07/22 ?Potential to Achieve Goals: Good ?ADL Goals ?Pt Will Perform Lower Body Bathing: with mod assist;sit to/from stand;sitting/lateral leans ?Pt Will Perform Lower Body Dressing: with mod assist;sit to/from stand;sitting/lateral leans ?Pt Will Transfer to Toilet: with max assist;with +2 assist;stand pivot transfer;bedside commode ?Pt/caregiver will Perform Home Exercise Program: Increased strength;Both right and left upper extremity;With theraband;Independently;With written HEP provided  ?Plan Discharge plan remains appropriate   ? ?Co-evaluation ? ? ?   ?  ?  ?  ?  ? ?  ?AM-PAC OT "6 Clicks" Daily Activity     ?Outcome Measure ? ? Help from another person eating meals?: None ?Help from another person taking care of personal grooming?: A Little ?Help from another person toileting, which includes using toliet, bedpan, or urinal?: Total ?Help from another person bathing (including washing, rinsing, drying)?: A Lot ?Help from another person to put on and taking off regular upper body clothing?: A Little ?Help from another person to put on and taking off regular lower body clothing?: Total ?6 Click Score: 14 ? ?  ?End of Session Equipment Utilized During Treatment: Other (comment) (scrotal sling) ? ?OT Visit Diagnosis: Unsteadiness on feet (R26.81);Other abnormalities of gait and mobility (R26.89) ?  ?Activity Tolerance Patient tolerated treatment well ?  ?Patient Left in  chair;with call bell/phone within reach ?  ?Nurse Communication Mobility status;Other (comment) (skin integrity, sling mgmt) ?  ? ?   ? ?Time: 8295-6213 ?OT Time Calculation (min): 14 min ? ?Charges: OT General Charges ?$OT Visit: 1 Visit ?OT Treatments ?$Therapeutic Activity: 8-22 mins ? ?Bradd Canary, OTR/L ?Acute Rehab Services ?Office: [phone]  ? ?Lorre Munroe ?02/27/2022, 7:48 AM ?

## 2022-02-27 NOTE — Progress Notes (Addendum)
? ?Progress Note ? ?Patient Name: [name] ?Date of Encounter: 02/27/2022 ? ?[facility] Cardiologist: [name], MD  ? ?Subjective  ? ?Patient states he is feeling little better, legs are still very swollen, has little dyspnea, has not been out of bed and sleeps in chair mostly.  ? ?Inpatient Medications  ?  ?Scheduled Meds: ? acetaZOLAMIDE  250 mg Oral Daily  ? Chlorhexidine Gluconate Cloth  6 each Topical Daily  ? diltiazem  120 mg Oral Daily  ? furosemide  80 mg Oral BID  ? insulin aspart  0-15 Units Subcutaneous TID WC  ? insulin aspart  0-5 Units Subcutaneous QHS  ? insulin aspart protamine- aspart  26 Units Subcutaneous BID WC  ? isosorbide mononitrate  30 mg Oral Daily  ? loratadine  10 mg Oral Daily  ? metolazone  2.5 mg Oral Daily  ? multivitamin with minerals  1 tablet Oral Daily  ? nystatin cream   Topical BID  ? rivaroxaban  20 mg Oral Daily  ? sodium chloride flush  10-40 mL Intracatheter Q12H  ? sodium chloride flush  3 mL Intravenous Q12H  ? terazosin  10 mg Oral QHS  ? ?Continuous Infusions: ? sodium chloride    ? ?PRN Meds: ?sodium chloride, acetaminophen **OR** [DISCONTINUED] acetaminophen, albuterol, diclofenac Sodium, guaiFENesin, HYDROcodone-acetaminophen, metoprolol tartrate, ondansetron **OR** ondansetron (ZOFRAN) IV, ondansetron (ZOFRAN) IV, senna-docusate, sodium chloride flush, sodium chloride flush  ? ?Vital Signs  ?  ?Vitals:  ? 02/26/22 0340 02/26/22 1110 02/26/22 1927 02/27/22 0438  ?BP: 136/71 118/65 (!) 120/57 118/63  ?Pulse: 76 67 78 81  ?Resp: 19 20 20 19   ?Temp: 98 ?F (36.7 ?C) 97.7 ?F (36.5 ?C) 98 ?F (36.7 ?C) 98.3 ?F (36.8 ?C)  ?TempSrc: Oral   Oral  ?SpO2: 90% 92% 93% 91%  ?Height:      ? ? ?Intake/Output Summary (Last 24 hours) at 02/27/2022 0918 ?Last data filed at 02/27/2022 0640 ?Gross per 24 hour  ?Intake 1087.91 ml  ?Output 2300 ml  ?Net -1212.09 ml  ? ?   ? View : No data to display.  ?  ?  ?  ?   ? ?Telemetry  ?  ?A fib with controlled VR - Personally  Reviewed ? ?ECG  ?  ?No new tracing  - Personally Reviewed ? ?Physical Exam  ? ?GEN: No acute distress. Morbidly obese ?Neck: No JVD ?Cardiac: Irregularly irregular, no murmurs, rubs, or gallops.  ?Respiratory: Clear but diminished to auscultation bilaterally ?GI: Soft, nontender, non-distended  ?MS: Marked edema of BLE up to scrotum , ACE wrap in place, foul odor and weeping drainage noted  ?Neuro:  Nonfocal  ?Psych: Normal affect  ? ?Labs  ?  ?High Sensitivity Troponin:   ?Recent Labs  ?Lab 02/13/22 ?1600 02/13/22 ?1800  ?TROPONINIHS 9 9  ?   ?Chemistry ?Recent Labs  ?Lab 02/24/22 ?0530 02/25/22 ?02/27/22 02/26/22 ?02/28/22 02/27/22 ?0510  ?NA 134* 134* 131* 132*  ?K 3.8 4.0 3.7 3.8  ?CL 92* 91* 86* 88*  ?CO2 31 33* 33* 33*  ?GLUCOSE 193* 187* 227* 215*  ?BUN 79* 86* 91* 92*  ?CREATININE 1.74* 1.92* 1.91* 2.22*  ?CALCIUM 8.6* 8.8* 8.7* 8.8*  ?MG 2.1 2.2  --  2.2  ?GFRNONAA 42* 37* 37* 31*  ?ANIONGAP 11 10 12 11   ?  ?Lipids No results for input(s): CHOL, TRIG, HDL, LABVLDL, LDLCALC, CHOLHDL in the last 168 hours.  ?Hematology ?Recent Labs  ?Lab 02/21/22 ? 02/24/22 ?0530 02/27/22 ?0510  ?  WBC 8.6 8.5 8.6  ?RBC 4.21* 4.17* 4.52  ?HGB 9.5* 9.4* 10.2*  ?HCT 31.0* 30.2* 31.8*  ?MCV 73.6* 72.4* 70.4*  ?MCH 22.6* 22.5* 22.6*  ?MCHC 30.6 31.1 32.1  ?RDW 18.5* 18.6* 18.6*  ?PLT 243 284 341  ? ?Thyroid No results for input(s): TSH, FREET4 in the last 168 hours.  ?BNPNo results for input(s): BNP, PROBNP in the last 168 hours.  ?DDimer No results for input(s): DDIMER in the last 168 hours.  ? ?Radiology  ?  ?No results found. ? ?Cardiac Studies  ? ?Right heart cath 02/19/22: ? ?Findings: ?  ?RA = 18 ?RV = 60/24 ?PA =  63/19 (35) ?PCW = 22  ?Fick cardiac output/index = 10.5/3.8 ?Thermo CO/CI = 7.2/2.6 ?PVR = 1.9 WU (Thermo) ?FA sat = 92% ?PA sat = 63%, 65% ?High SVC = 68% ?PAPi = 2.4 ?  ?Assessment: ?1. Marked volume overload with R>>L heart failure ?2. Preserved cardiac output ?  ?Plan/Discussion: ?  ?Remains markedly volume  overloaded. Consider use of lasix gtt +/- acetazolamide or metolazone to facilitate diuresis. Keep LEs wrapped.  ?  ? ?Echo from 02/14/22: ? ? 1. Left ventricular ejection fraction, by estimation, is 55 to 60%. The  ?left ventricle has normal function. The left ventricle has no regional  ?wall motion abnormalities. There is moderate left ventricular hypertrophy.  ?Left ventricular diastolic function could not be evaluated.  ? 2. Right ventricular systolic function was not well visualized. The right  ?ventricular size is not well visualized.  ? 3. Left atrial size was mildly dilated.  ? 4. The mitral valve is normal in structure. No evidence of mitral valve  ?regurgitation.  ? 5. The aortic valve is normal in structure. There is mild calcification  ?of the aortic valve. Aortic valve regurgitation is trivial.  ? ? ?Patient Profile  ?   ?71 yo male with the PMH of atrial fibrillation, hypertension, combined heart failure, T2DM , CKD III, OSA, RLS, obesity, cardiology is following since 02/14/22 for CHF.  ? ?Assessment & Plan  ?  ?  ?Acute hypoxic respiratory failure, resolved  ?Acute on chronic diastolic heart failure ?Right heart failure ?- Echo from 02/14/22 showed EF 55 to 60%, unable assess diastolic function and RV ?- CXR with pulmonary vascular congestion, hazy bilateral opacities that may represent mild edema ?- BNP 294, but in the setting of morbid obesity ?- Right heart cath 02/19/22 with RA 18, RV 60/24, PA 63/19, PCW 22, Fick cardiac output/index = 10.5/3.8; marked right heart failure  ?- COOX down from 79.2% to 49.7%  ?- Started on Lasix drip currently at 10 mg/h along with Metolazone 2.5mg  and diamox for diuresis, Cr uptrending 2.22 today, UOP 3L over the past 24 hours, if worsening may need drop metolazone, still volume overloaded but much improved, transition to PO lasix 80mg  BID today ?- weight down from 400 to 392 ibs, net -12.3L since admission  ?- GDMT: currently on Imdur, spironolactone, and irbesartan  ; with worsening renal function, will drop ARB and MRA today,  no SGLT2I for now  ? ?AKI on CKD III ?- Cr 2.22 with GFR 31 today, uptrending ?- clinically remains volume overloaded, continue monitor diuresis response, may need nephrology consult if continue worsening  ?  ?Persistent Afib ?-heart rate controlled ?-continue Cardizem CD 120mg  daily ?-Continue Xarelto ?  ?Type 2 DM with hyperglycemia  ?Lymphedema of BLE with cellulitis  ?Obesity  ?-managed per hospital medicine  ?  ? ?For questions or  updates, please contact [facility] ?Please consult [email or web address] for contact info under  ? ?  ?   ?Signed, ?Cyndi BenderXika Zhao, NP  ?02/27/2022, 9:18 AM   ? ?Patient examined chart reviewed still with gross LE edema that is not all CHF likely morbid obesity and venous dx contribute Cr starting to rise bicarb 33 will stop lasix drip and place on 80 mg iv bid. At some point will need to d/c diamox  ? ?Charlton HawsPeter Mette Southgate MD [facility] ? ?

## 2022-02-27 NOTE — Progress Notes (Signed)
Heart Failure Nurse Navigator Progress Note ? ?PCP: Tammi Sou, MD ?PCP-Cardiologist: Dr. Debara Pickett ?Admission Diagnosis: Acute hypoxic respiratory failure with Acute on chronic diastolic heart failure. ?Admitted from: Wound clinic ? ?Presentation:   ?[name] presented with Dyspnea and weight gain, 25 lbs over the last month, stopped taking his lasix, because it made him urinate too much. Short of breath with exertion, chronic venus stasis of lower extremities and goes to the wound clinic. Given IV lasix, 3+ edema to lower extremities, PICC line placed for CVP monitoring and lasix drip. Right heart cath done 02/19/22 with marked volume overload,started on  Metolazone.  ? ?Patient was educated on his diet/ fluid restrictions, the sign and symptoms of heart failure, when to call his doctor or go to the ER, taking all his medication as as prescribed, especially his lasix.Keep all his appointments, patient stated that he is hoping his apartment will be fixed after his stay at SNF, so he doesn't;t have to go back to the hotel he was staying at prior to admission. He is agreeable to come to HF [facility] appointment on 03/12/22 @ 12 noon.  ? ?ECHO/ LVEF: 50-65% ? ?Clinical Course: ? ?Past Medical History:  ?Diagnosis Date  ? ALLERGIC RHINITIS 08/11/2006  ? ASTHMA 08/11/2006  ? Bradycardia   ? Beta blocker d/c'd 2022  ? Chronic atrial fibrillation ([place]) 08/2014  ? Chronic combined systolic and diastolic CHF (congestive heart failure) ([place])   ? Cardiology f/u 12/2017: pt volume overloaded (R heart dysf suspected), BNP very high, lasix increased.  Repeat echo 12/2017: normal LV EF, mild DD, +RV syst dysfxn, mod pulm HTN, biatrial enlgmt.  ? Chronic constipation   ? Chronic renal insufficiency, stage 3 (moderate) (H[facility])   ? Colon cancer screening 02/2021  ? 02/2021 Cologuard POS->GI ref  ? DIABETES MELLITUS, TYPE II 08/11/2006  ? HYPERTENSION 08/11/2006  ? Impaired mobility and endurance   ? MRSA infection 06/2018  ? LL  venous stasis ulcer infected  ? Normocytic anemia 2016-2019  ? 03/2018 B12 normal, iron ok (ferritin borderline low).  ? OBESITY, MORBID 12/14/2007  ? saxenda started 05/2020 by [facility] wt mgmt center  ? OSA (obstructive sleep apnea)   ? to get sleep study with [facility]) as of 12/03/2018 consult.  ? 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 06/2017 Clindamycin suppression (hx of MRSA) caused diarrhea.  [id number] ID started him on amoxil prophylaxis---ineffective.  End 2018/Jan 2019 penicillin G injections prophyl helpful but pt declined to continue this as of 01/2018 ID f/u.  ID talked him into resuming monthly penicillin G as of 02/2018 f/u.    ? Restless leg syndrome   ? Rx'd clonazepam 09/2017 and pt refused to take it after reading the medication's potential side effects.  ? Venous stasis ulcers of both lower extremities ([place])   ? Severe lymphedema.  wound clinic care ongoing as of 01/2018  ?  ? ?Social History  ? ?Socioeconomic History  ? Marital status: Single  ?  Spouse name: Not on file  ? Number of children: 0  ? Years of education: 58  ? Highest education level: Associate degree: occupational, Hotel manager, or vocational program  ?Occupational History  ? Occupation: retired  ?Tobacco Use  ? Smoking status: Never  ?  Passive exposure: Never  ? Smokeless tobacco: Never  ?Vaping Use  ? Vaping Use: Never used  ?Substance and Sexual Activity  ? Alcohol use: No  ? Drug use: No  ? Sexual  activity: Not Currently  ?Other Topics Concern  ? Not on file  ?Social History Narrative  ? Single, no children.  ? Lives in [place] since 1985 [place] from [place]).  ? Occupation: Counsellor for [employer].  ? No Tobacco, no alc, drugs.  ? Exercise: no   ? Diet: no  ?   ? ?Social Determinants of Health  ? ?Financial Resource Strain: Medium Risk  ? Difficulty of Paying Living Expenses: Somewhat hard  ?Food Insecurity: Food Insecurity Present  ? Worried About Charity fundraiser in the Last Year: Sometimes  true  ? Ran Out of Food in the Last Year: Sometimes true  ?Transportation Needs: No Transportation Needs  ? Lack of Transportation (Medical): No  ? Lack of Transportation (Non-Medical): No  ?Physical Activity: Inactive  ? Days of Exercise per Week: 0 days  ? Minutes of Exercise per Session: 0 min  ?Stress: Stress Concern Present  ? Feeling of Stress : Very much  ?Social Connections: Moderately Integrated  ? Frequency of Communication with Friends and Family: Three times a week  ? Frequency of Social Gatherings with Friends and Family: Never  ? Attends Religious Services: 1 to 4 times per year  ? Active Member of Clubs or Organizations: Yes  ? Attends Archivist Meetings: 1 to 4 times per year  ? Marital Status: Never married  ? ?Education Assessment and Provision: ? ?Detailed education and instructions provided on heart failure disease management including the following: ? ?Signs and symptoms of Heart Failure ?When to call the physician ?Importance of daily weights ?Low sodium diet ?Fluid restriction ?Medication management ?Anticipated future follow-up appointments ? ?Patient education given on each of the above topics.  Patient acknowledges understanding via teach back method and acceptance of all instructions. ? ?Education Materials:  "Living Better With Heart Failure" Booklet, HF zone tool, & Daily Weight Tracker Tool. ? ?Patient has scale at home: yes ?Patient has pill box at home: NA   ? ?High Risk Criteria for Readmission and/or Poor Patient Outcomes: ?Heart failure hospital admissions (last 6 months): 1  ?No Show rate: 2 % ?Difficult social situation: Yes, mobility, food tight budget.  ?Demonstrates medication adherence: Yes per patient ?Primary Language: English ?Literacy level: reading, writing and comprehension.  ? ?Barriers of Care:   ?Increased BMI/ Mobility ?Fluid restrictions ? ?Considerations/Referrals:  ? ?Referral made to Heart Failure Pharmacist Stewardship: Yes ?Referral made to Heart  Failure CSW/NCM TOC: SNF arranged and approved ?Referral made to Heart & Vascular TOC clinic: yes, 03/12/2022 @ 12 noon ? ?Items for Follow-up on DC/TOC: ?Optimize medication ?Diet/fluid restrictions ? ? ?Earnestine Leys, BSN, RN ?Heart Failure Nurse Navigator ?Secure Chat Only   ?

## 2022-02-27 NOTE — Progress Notes (Signed)
Mobility Specialist Progress Note: ? ? 02/27/22 1000  ?Mobility  ?Activity Transferred to/from [facility]  ?Level of Assistance Maximum assist, patient does 25-49% ?(+2)  ?Assistive Device Four wheel walker  ?Activity Response Tolerated fair  ?$Mobility charge 1 Mobility  ? ?Session focused on getting pt back to bed after sleeping in recliner last night. Required maxA+2 to stand and an extra person to move furniture. Pt left lying in bed with all needs met.  ? ?Nelta Numbers ?Acute Rehab ?Phone: [phone] ?Office Phone: [phone] ? ?

## 2022-02-27 NOTE — Progress Notes (Signed)
?PROGRESS NOTE ? ? ? ?[name]  [id number] DOB: [birth year] DOA: 02/13/2022 ?PCP: Tammi Sou, MD  ?[name] morbidly obese, BMI of 56, history of type 2 diabetes mellitus, chronic diastolic CHF, paroxysmal atrial fibrillation, hypertension presented to the ED with dyspnea and progressive weight gain of 25lbs over 4 weeks. ?Admitted with significant volume overload, followed by cardiology in consultation ?-Diuresed with IV Lasix and treated for UTI as well ?-4/18 was transferred to [facility] from [facility] for cardiac work-up, right heart cath 4/18 noted marked volume overload, predominantly right heart failure, preserved cardiac output ?-4/19 started Lasix drip and metolazone ? ?Subjective: ?-Feels a little better overall, due for dressing changes today ? ?Assessment and Plan: ? ? Acute on chronic diastolic CHF (congestive heart failure) ([place]) ?Echo with EF 55 to 60% with moderate LVH, not well visualized RV ?-Diuresed on IV Lasix and Lasix gtt., he is 12.3 L negative  ?-Right heart cath 4/18 noted markedly elevated filling pressures, R>L heart failure, preserved cardiac output  ?-Still significantly volume overloaded, now changed to Lasix 80 Mg IV twice daily ?-Remains on metolazone ?-GDMT limited by CKD, may need milrinone gtt. if creatinine continues to worsen ?-Monitor urine output, BMP in a.m. ? ?Atrial fibrillation ([place]) ?-Remains in A-fib, heart rate controlled on Cardizem and Xarelto ? ?Acute kidney injury superimposed on chronic kidney disease ([place]) ?CKD stage 3b. Hyponatremia. Hypokalemia ?-Baseline creatinine around 1.6-1.8, creatinine has bumped up to 2.2 ?-Suspect cardiorenal etiology ?-Monitor with diuresis, may need milrinone ? ?Hypertension, essential ?-Stable, continue Imdur ? ?DM2 (diabetes mellitus, type 2) ([place]) ?Uncontrolled with hyperglycemia ?-Increase insulin 70/30 dose ? ?Lymphedema of both lower extremities ?Chronic wounds ?-Worsened in the setting of right heart  failure ?-Completed 5 days of IV ceftriaxone for UTI and possible wound infection ?-Continue topical antifungal and local wound care, gets dressing changes every Wednesday ? ?UTI ?-Completed ABX therapy ? ?Morbid obesity ?Calculated BMI is 56.3.  ? ? ?DVT prophylaxis: Xarelto ?Code Status: Discussed CODE STATUS with the patient and he wishes to be a DNR ?Family Communication: Discussed with patient in detail, no family at bedside ?Disposition Plan: SNF pending improvement in volume status ? ?Consultants:  ?Cardiology ? ?Procedures:  ? ?Antimicrobials:  ? ? ?Objective: ?Vitals:  ? 02/26/22 1110 02/26/22 1927 02/27/22 0438 02/27/22 1204  ?BP: 118/65 (!) 120/57 118/63 138/74  ?Pulse: 67 78 81 80  ?Resp: 20 20 19 20   ?Temp: 97.7 ?F (36.5 ?C) 98 ?F (36.7 ?C) 98.3 ?F (36.8 ?C) 98 ?F (36.7 ?C)  ?TempSrc:   Oral Oral  ?SpO2: 92% 93% 91% 92%  ?Height:      ? ? ?Intake/Output Summary (Last 24 hours) at 02/27/2022 1352 ?Last data filed at 02/27/2022 1213 ?Gross per 24 hour  ?Intake 1070.91 ml  ?Output 2500 ml  ?Net -1429.09 ml  ? ?Filed Weights  ? ? ?Examination: ? ?General exam: Chronically ill morbidly obese male sitting up in the recliner, AAOx3 ?CVS: S1-S2, irregularly irregular rhythm ?Lungs: Poor air movement bilaterally ?Abdomen: Soft, obese, significant abdominal wall edema noted ?Extremities: 3+ edema, bilateral lower leg dressing  ?Psychiatry:  Mood & affect appropriate.  ? ? ? ?Data Reviewed:  ? ?CBC: ?Recent Labs  ?Lab 02/21/22 ?[id number] 02/24/22 ?0530 02/27/22 ?0510  ?WBC 8.6 8.5 8.6  ?HGB 9.5* 9.4* 10.2*  ?HCT 31.0* 30.2* 31.8*  ?MCV 73.6* 72.4* 70.4*  ?PLT 243 284 341  ? ?Basic Metabolic Panel: ?Recent Labs  ?Lab 02/22/22 ?[id number] 02/23/22 ?0440 02/24/22 ?0530 02/25/22 ?[id number] 02/26/22 ?[id number]  02/27/22 ?0510  ?NA 135 135 134* 134* 131* 132*  ?K 3.8 3.6 3.8 4.0 3.7 3.8  ?CL 95* 94* 92* 91* 86* 88*  ?CO2 32 32 31 33* 33* 33*  ?GLUCOSE 162* 183* 193* 187* 227* 215*  ?BUN 61* 71* 79* 86* 91* 92*  ?CREATININE 1.90* 1.88* 1.74*  1.92* 1.91* 2.22*  ?CALCIUM 8.8* 8.6* 8.6* 8.8* 8.7* 8.8*  ?MG 2.3 2.2 2.1 2.2  --  2.2  ? ?GFR: ?Estimated Creatinine Clearance: 50.3 mL/min (A) (by C-G formula based on SCr of 2.22 mg/dL (H)). ?Liver Function Tests: ?No results for input(s): AST, ALT, ALKPHOS, BILITOT, PROT, ALBUMIN in the last 168 hours. ?No results for input(s): LIPASE, AMYLASE in the last 168 hours. ?No results for input(s): AMMONIA in the last 168 hours. ?Coagulation Profile: ?No results for input(s): INR, PROTIME in the last 168 hours. ?Cardiac Enzymes: ?No results for input(s): CKTOTAL, CKMB, CKMBINDEX, TROPONINI in the last 168 hours. ?BNP (last 3 results) ?No results for input(s): PROBNP in the last 8760 hours. ?HbA1C: ?No results for input(s): HGBA1C in the last 72 hours. ?CBG: ?Recent Labs  ?Lab 02/26/22 ?1105 02/26/22 ?1607 02/26/22 ?2114 02/27/22 ?[id number] 02/27/22 ?1202  ?GLUCAP 201* 175* 213* 202* 209*  ? ?Lipid Profile: ?No results for input(s): CHOL, HDL, LDLCALC, TRIG, CHOLHDL, LDLDIRECT in the last 72 hours. ?Thyroid Function Tests: ?No results for input(s): TSH, T4TOTAL, FREET4, T3FREE, THYROIDAB in the last 72 hours. ?Anemia Panel: ?No results for input(s): VITAMINB12, FOLATE, FERRITIN, TIBC, IRON, RETICCTPCT in the last 72 hours. ?Urine analysis: ?   ?Component Value Date/Time  ? COLORURINE YELLOW 02/25/2022 1636  ? APPEARANCEUR HAZY (A) 02/25/2022 1636  ? LABSPEC 1.010 02/25/2022 1636  ? PHURINE 5.0 02/25/2022 1636  ? GLUCOSEU NEGATIVE 02/25/2022 1636  ? GLUCOSEU NEGATIVE 10/18/2020 1326  ? HGBUR LARGE (A) 02/25/2022 1636  ? Carbon NEGATIVE 02/25/2022 1636  ? Benjamin Stain NEGATIVE 02/25/2022 1636  ? PROTEINUR NEGATIVE 02/25/2022 1636  ? UROBILINOGEN 1.0 10/18/2020 1326  ? NITRITE NEGATIVE 02/25/2022 1636  ? LEUKOCYTESUR MODERATE (A) 02/25/2022 1636  ? ?Sepsis Labs: ?@LABRCNTIP (procalcitonin:4,lacticidven:4) ? ?)No results found for this or any previous visit (from the past 240 hour(s)).  ? ?Radiology Studies: ?No results  found. ? ? ?Scheduled Meds: ? acetaZOLAMIDE  250 mg Oral Daily  ? Chlorhexidine Gluconate Cloth  6 each Topical Daily  ? diltiazem  120 mg Oral Daily  ? furosemide  80 mg Intravenous BID  ? insulin aspart  0-15 Units Subcutaneous TID WC  ? insulin aspart  0-5 Units Subcutaneous QHS  ? insulin aspart protamine- aspart  26 Units Subcutaneous BID WC  ? isosorbide mononitrate  30 mg Oral Daily  ? loratadine  10 mg Oral Daily  ? metolazone  2.5 mg Oral Daily  ? multivitamin with minerals  1 tablet Oral Daily  ? nystatin cream   Topical BID  ? rivaroxaban  20 mg Oral Daily  ? terazosin  10 mg Oral QHS  ? ?Continuous Infusions: ? ? LOS: 14 days  ? ? ?Time spent: 87min ? ?Domenic Polite, MD ?[facility] ? ? ?02/27/2022, 1:52 PM  ?  ?

## 2022-02-27 NOTE — Progress Notes (Signed)
Mobility Specialist Progress Note: ? ? 02/27/22 0920  ?Mobility  ?Activity Transferred to/from [facility]  ?Level of Assistance Maximum assist, patient does 25-49% ?(+2)  ?Assistive Device Four wheel walker  ?Activity Response Tolerated fair  ?$Mobility charge 1 Mobility  ? ?Pt requesting to get on [facility] for BM. Required maxA+2 to stand and extra person to move furniture. Pt left with call bell.  ? ?Addison Lank ?Acute Rehab ?Phone: [phone] ?Office Phone: [phone] ? ?

## 2022-02-28 ENCOUNTER — Other Ambulatory Visit: Payer: Self-pay | Admitting: *Deleted

## 2022-02-28 DIAGNOSIS — I5033 Acute on chronic diastolic (congestive) heart failure: Secondary | ICD-10-CM | POA: Diagnosis not present

## 2022-02-28 LAB — CBC
HCT: 31.5 % — ABNORMAL LOW (ref 39.0–52.0)
Hemoglobin: 10.2 g/dL — ABNORMAL LOW (ref 13.0–17.0)
MCH: 22.8 pg — ABNORMAL LOW (ref 26.0–34.0)
MCHC: 32.4 g/dL (ref 30.0–36.0)
MCV: 70.3 fL — ABNORMAL LOW (ref 80.0–100.0)
Platelets: 372 10*3/uL (ref 150–400)
RBC: 4.48 MIL/uL (ref 4.22–5.81)
RDW: 18.8 % — ABNORMAL HIGH (ref 11.5–15.5)
WBC: 8.2 10*3/uL (ref 4.0–10.5)
nRBC: 0 % (ref 0.0–0.2)

## 2022-02-28 LAB — COMPREHENSIVE METABOLIC PANEL
ALT: 26 U/L (ref 0–44)
AST: 25 U/L (ref 15–41)
Albumin: 2.8 g/dL — ABNORMAL LOW (ref 3.5–5.0)
Alkaline Phosphatase: 96 U/L (ref 38–126)
Anion gap: 11 (ref 5–15)
BUN: 97 mg/dL — ABNORMAL HIGH (ref 8–23)
CO2: 33 mmol/L — ABNORMAL HIGH (ref 22–32)
Calcium: 8.7 mg/dL — ABNORMAL LOW (ref 8.9–10.3)
Chloride: 90 mmol/L — ABNORMAL LOW (ref 98–111)
Creatinine, Ser: 2.28 mg/dL — ABNORMAL HIGH (ref 0.61–1.24)
GFR, Estimated: 30 mL/min — ABNORMAL LOW (ref >60)
Glucose, Bld: 194 mg/dL — ABNORMAL HIGH (ref 70–99)
Potassium: 3.7 mmol/L (ref 3.5–5.1)
Sodium: 134 mmol/L — ABNORMAL LOW (ref 135–145)
Total Bilirubin: 0.7 mg/dL (ref 0.3–1.2)
Total Protein: 7.1 g/dL (ref 6.5–8.1)

## 2022-02-28 LAB — COOXEMETRY PANEL
Carboxyhemoglobin: 2.1 % — ABNORMAL HIGH (ref 0.5–1.5)
Methemoglobin: <0.7 % (ref 0.0–1.5)
O2 Saturation: 60.7 %
Total hemoglobin: <6.9 g/dL — CL (ref 12.0–16.0)

## 2022-02-28 LAB — GLUCOSE, CAPILLARY
Glucose-Capillary: 190 mg/dL — ABNORMAL HIGH (ref 70–99)
Glucose-Capillary: 201 mg/dL — ABNORMAL HIGH (ref 70–99)
Glucose-Capillary: 229 mg/dL — ABNORMAL HIGH (ref 70–99)
Glucose-Capillary: 152 mg/dL — ABNORMAL HIGH (ref 70–99)
Glucose-Capillary: 151 mg/dL — ABNORMAL HIGH (ref 70–99)

## 2022-02-28 MED ORDER — INSULIN ASPART PROT & ASPART (70-30 MIX) 100 UNIT/ML ~~LOC~~ SUSP
30.0000 [IU] | Freq: Two times a day (BID) | SUBCUTANEOUS | Status: DC
  Administered 2022-02-28 – 2022-03-07 (×14): 30 [IU] via SUBCUTANEOUS
  Filled 2022-02-28 (×2): qty 10

## 2022-02-28 MED ORDER — ALBUMIN HUMAN 25 % IV SOLN
25.0000 g | Freq: Four times a day (QID) | INTRAVENOUS | Status: AC
  Administered 2022-02-28 (×2): 25 g via INTRAVENOUS
  Filled 2022-02-28 (×2): qty 100

## 2022-02-28 NOTE — Progress Notes (Signed)
? ?Progress Note ? ?Patient Name: [name] ?Date of Encounter: 02/28/2022 ? ?[facility] Cardiologist: [name], MD  ? ?Subjective  ? ?Still with poor mobility and LE edema  ? ?Inpatient Medications  ?  ?Scheduled Meds: ? acetaZOLAMIDE  250 mg Oral Daily  ? Chlorhexidine Gluconate Cloth  6 each Topical Daily  ? diltiazem  120 mg Oral Daily  ? furosemide  80 mg Intravenous BID  ? insulin aspart  0-15 Units Subcutaneous TID WC  ? insulin aspart  0-5 Units Subcutaneous QHS  ? insulin aspart protamine- aspart  26 Units Subcutaneous BID WC  ? isosorbide mononitrate  30 mg Oral Daily  ? loratadine  10 mg Oral Daily  ? metolazone  2.5 mg Oral Daily  ? multivitamin with minerals  1 tablet Oral Daily  ? nystatin cream   Topical BID  ? rivaroxaban  20 mg Oral Daily  ? terazosin  10 mg Oral QHS  ? ?Continuous Infusions: ? ? ?PRN Meds: ?acetaminophen **OR** [DISCONTINUED] acetaminophen, albuterol, diclofenac Sodium, guaiFENesin, HYDROcodone-acetaminophen, metoprolol tartrate, ondansetron **OR** ondansetron (ZOFRAN) IV, ondansetron (ZOFRAN) IV, senna-docusate, sodium chloride flush  ? ?Vital Signs  ?  ?Vitals:  ? 02/27/22 2300 02/28/22 0000 02/28/22 0300 02/28/22 0506  ?BP:   (!) 109/57   ?Pulse:   76   ?Resp: (!) 33 14 19   ?Temp:   98.3 ?F (36.8 ?C)   ?TempSrc:   Oral   ?SpO2:   90%   ?Weight:    (!) 147.4 kg  ?Height:      ? ? ?Intake/Output Summary (Last 24 hours) at 02/28/2022 0844 ?Last data filed at 02/28/2022 0500 ?Gross per 24 hour  ?Intake 456.57 ml  ?Output 1575 ml  ?Net -1118.43 ml  ? ? ?  02/28/2022  ?  5:06 AM  ?Last 3 Weights  ?Weight (lbs) 324 lb 14.4 oz  ?Weight (kg) 147.374 kg  ?   ? ?Telemetry  ?  ?A fib with controlled VR - Personally Reviewed ? ?ECG  ?  ?No new tracing  - Personally Reviewed ? ?Physical Exam  ? ?GEN: No acute distress. Morbidly obese ?Neck: No JVD ?Cardiac: Irregularly irregular, no murmurs, rubs, or gallops.  ?Respiratory: Clear but diminished to auscultation  bilaterally ?GI: Soft, nontender, non-distended  ?MS: Marked edema of BLE up to scrotum , ACE wrap in place, foul odor and weeping drainage noted  ?Neuro:  Nonfocal  ?Psych: Normal affect  ? ?Labs  ?  ?High Sensitivity Troponin:   ?Recent Labs  ?Lab 02/13/22 ?1600 02/13/22 ?1800  ?TROPONINIHS 9 9  ?   ?Chemistry ?Recent Labs  ?Lab 02/24/22 ?0530 02/25/22 ?1572 02/26/22 ?6203 02/27/22 ?0510 02/28/22 ?0615  ?NA 134* 134* 131* 132* 134*  ?K 3.8 4.0 3.7 3.8 3.7  ?CL 92* 91* 86* 88* 90*  ?CO2 31 33* 33* 33* 33*  ?GLUCOSE 193* 187* 227* 215* 194*  ?BUN 79* 86* 91* 92* 97*  ?CREATININE 1.74* 1.92* 1.91* 2.22* 2.28*  ?CALCIUM 8.6* 8.8* 8.7* 8.8* 8.7*  ?MG 2.1 2.2  --  2.2  --   ?PROT  --   --   --   --  7.1  ?ALBUMIN  --   --   --   --  2.8*  ?AST  --   --   --   --  25  ?ALT  --   --   --   --  26  ?ALKPHOS  --   --   --   --  96  ?BILITOT  --   --   --   --  0.7  ?GFRNONAA 42* 37* 37* 31* 30*  ?ANIONGAP 11 10 12 11 11   ?  ?Lipids No results for input(s): CHOL, TRIG, HDL, LABVLDL, LDLCALC, CHOLHDL in the last 168 hours.  ?Hematology ?Recent Labs  ?Lab 02/24/22 ?0530 02/27/22 ?0510 02/28/22 ?0630  ?WBC 8.5 8.6 8.2  ?RBC 4.17* 4.52 4.48  ?HGB 9.4* 10.2* 10.2*  ?HCT 30.2* 31.8* 31.5*  ?MCV 72.4* 70.4* 70.3*  ?MCH 22.5* 22.6* 22.8*  ?MCHC 31.1 32.1 32.4  ?RDW 18.6* 18.6* 18.8*  ?PLT 284 341 372  ? ?Thyroid No results for input(s): TSH, FREET4 in the last 168 hours.  ?BNPNo results for input(s): BNP, PROBNP in the last 168 hours.  ?DDimer No results for input(s): DDIMER in the last 168 hours.  ? ?Radiology  ?  ?No results found. ? ?Cardiac Studies  ? ?Right heart cath 02/19/22: ? ?Findings: ?  ?RA = 18 ?RV = 60/24 ?PA =  63/19 (35) ?PCW = 22  ?Fick cardiac output/index = 10.5/3.8 ?Thermo CO/CI = 7.2/2.6 ?PVR = 1.9 WU (Thermo) ?FA sat = 92% ?PA sat = 63%, 65% ?High SVC = 68% ?PAPi = 2.4 ?  ?Assessment: ?1. Marked volume overload with R>>L heart failure ?2. Preserved cardiac output ?  ?Plan/Discussion: ?  ?Remains markedly volume  overloaded. Consider use of lasix gtt +/- acetazolamide or metolazone to facilitate diuresis. Keep LEs wrapped.  ?  ? ?Echo from 02/14/22: ? ? 1. Left ventricular ejection fraction, by estimation, is 55 to 60%. The  ?left ventricle has normal function. The left ventricle has no regional  ?wall motion abnormalities. There is moderate left ventricular hypertrophy.  ?Left ventricular diastolic function could not be evaluated.  ? 2. Right ventricular systolic function was not well visualized. The right  ?ventricular size is not well visualized.  ? 3. Left atrial size was mildly dilated.  ? 4. The mitral valve is normal in structure. No evidence of mitral valve  ?regurgitation.  ? 5. The aortic valve is normal in structure. There is mild calcification  ?of the aortic valve. Aortic valve regurgitation is trivial.  ? ? ?Patient Profile  ?   ?71 yo male with the PMH of atrial fibrillation, hypertension, combined heart failure, T2DM , CKD III, OSA, RLS, obesity, cardiology is following since 02/14/22 for CHF.  ? ?Assessment & Plan  ?  ?  ?Acute hypoxic respiratory failure, resolved  ?Acute on chronic diastolic heart failure ?Right heart failure ?- Echo from 02/14/22 showed EF 55 to 60%, unable assess diastolic function and RV ?- CXR with pulmonary vascular congestion, hazy bilateral opacities that may represent mild edema ?- BNP 294, but in the setting of morbid obesity ?- Right heart cath 02/19/22 with RA 18, RV 60/24, PA 63/19, PCW 22, Fick cardiac output/index = 10.5/3.8; marked right heart failure  ?- COOX better 67%  ?- Good diuresis now on 80 iv bid lasix Bicarb stable 33  ?- weight down from 400 to 392 ibs, net -12.3L since admission  ?- GDMT:  ? ?AKI on CKD III ?- Cr 2.28  D/c zaroxyln   ?- clinically remains volume overloaded, lasix drip d/c on 80 bid lasix with > 1 L additional diuresis  ?  ?Persistent Afib ?-heart rate controlled ?-continue Cardizem CD 120mg  daily ?-Continue Xarelto ?  ?Type 2 DM with hyperglycemia   ?Lymphedema of BLE with cellulitis  ?Obesity  ?-managed per hospital medicine  ?  ? ?  For questions or updates, please contact [facility] ?Please consult [email or web address] for contact info under  ? ? ?   ?Signed, ?Charlton Haws, MD  ?02/28/2022, 8:44 AM   ? ? ? ?

## 2022-02-28 NOTE — Progress Notes (Signed)
Physical Therapy Treatment ?Patient Details ?Name: [name] ?MRN: [id number] ?DOB: Jun 05, 1951 ?Today's Date: 02/28/2022 ? ? ?History of Present Illness 71 y.o. male presented to the ER on 02/13/2022 with weight gain and hypoxia. Pt with acute on chronic chf and UTI. Transferred to [facility] on 4/17 and underwent heart cath on 4/18.PMH significant for A-fib, HFpEF, HTN, DMII, morbid obesity, venous stasis, lymphedema. ? ?  ?PT Comments  ? ? The pt was agreeable to session with focus on progression of OOB mobility and activity. He required increased encouragement and rest break between attempts, and was only able to successfully stand once. The pt continues to need significant assist to power up, wt shift forward, and maintain upright balance due to poor strength, power, and stability in BLE. The pt was educated on LE strengthening to complete outside of session, he remains motivated to improve mobility but will need max skilled PT intervention to facilitate return to prior level of mobility and independence.  ?  ?Recommendations for follow up therapy are one component of a multi-disciplinary discharge planning process, led by the attending physician.  Recommendations may be updated based on patient status, additional functional criteria and insurance authorization. ? ?Follow Up Recommendations ? Skilled nursing-short term rehab (<3 hours/day) ?  ?  ?Assistance Recommended at Discharge Frequent or constant Supervision/Assistance  ?Patient can return home with the following Two people to help with walking and/or transfers;Two people to help with bathing/dressing/bathroom;Assist for transportation;Help with stairs or ramp for entrance;Assistance with cooking/housework ?  ?Equipment Recommendations ? None recommended by PT  ?  ?Recommendations for Other Services   ? ? ?  ?Precautions / Restrictions Precautions ?Precautions: Fall ?Precaution Comments: significant wounds BLE with weeping, scrotal  swelling ?Restrictions ?Weight Bearing Restrictions: No  ?  ? ?Mobility ? Bed Mobility ?  ?  ?  ?  ?  ?  ?  ?General bed mobility comments: pt declining return to bed ?  ? ?Transfers ?Overall transfer level: Needs assistance ?Equipment used: Rollator (4 wheels) ?Transfers: Sit to/from Stand ?Sit to Stand: +2 physical assistance ?  ?  ?  ?  ?  ?General transfer comment: maxA from [facility] with pt holding rollator handles, unable to do more than initial hip lift when standing from chair with hands on armrests. able to maintain for 10-15 seconds, then despite continued attempts, unable to achieve stand. ?  ? ?  ?Balance Overall balance assessment: Needs assistance ?Sitting-balance support: Feet supported ?Sitting balance-Leahy Scale: Fair ?Sitting balance - Comments: sitting EOB without assist ?  ?Standing balance support: Bilateral upper extremity supported, Reliant on assistive device for balance, During functional activity ?Standing balance-Leahy Scale: Poor ?Standing balance comment: RW or rollator for standing ?  ?  ?  ?  ?  ?  ?  ?  ?  ?  ?  ?  ? ?  ?Cognition Arousal/Alertness: Awake/alert ?Behavior During Therapy: [facility] for tasks assessed/performed ?Overall Cognitive Status: Within Functional Limits for tasks assessed ?  ?  ?  ?  ?  ?  ?  ?  ?  ?  ?  ?  ?  ?  ?  ?  ?General Comments: pt at times fearful with mobility and benefits from reassurance ?  ?  ? ?  ?Exercises   ? ?  ?General Comments General comments (skin integrity, edema, etc.): VSS with standing ?  ?  ? ?Pertinent Vitals/Pain Pain Assessment ?Pain Assessment: Faces ?Faces Pain Scale: Hurts even more ?Pain Location: BLE, bilateral  knees ?Pain Descriptors / Indicators: Grimacing, Guarding ?Pain Intervention(s): Limited activity within patient's tolerance, Monitored during session, Repositioned  ? ? ? ?PT Goals (current goals can now be found in the care plan section) Acute Rehab PT Goals ?Patient Stated Goal: get rollator brakes fixed and get rid of  fluid ?PT Goal Formulation: With patient ?Time For Goal Achievement: 03/06/22 ?Potential to Achieve Goals: Fair ?Progress towards PT goals: Progressing toward goals ? ?  ?Frequency ? ? ? Min 3X/week ? ? ? ?  ?PT Plan Current plan remains appropriate  ? ? ?   ?AM-PAC PT "6 Clicks" Mobility   ?Outcome Measure ? Help needed turning from your back to your side while in a flat bed without using bedrails?: Total ?Help needed moving from lying on your back to sitting on the side of a flat bed without using bedrails?: Total ?Help needed moving to and from a bed to a chair (including a wheelchair)?: Total ?Help needed standing up from a chair using your arms (e.g., wheelchair or bedside chair)?: Total ?Help needed to walk in hospital room?: Total ?Help needed climbing 3-5 steps with a railing? : Total ?6 Click Score: 6 ? ?  ?End of Session Equipment Utilized During Treatment: Gait belt ?Activity Tolerance: Patient limited by fatigue ?Patient left: with call bell/phone within reach;in chair ?Nurse Communication: Mobility status ?PT Visit Diagnosis: Unsteadiness on feet (R26.81);Muscle weakness (generalized) (M62.81);Difficulty in walking, not elsewhere classified (R26.2) ?  ? ? ?Time: 7494-4967 ?PT Time Calculation (min) (ACUTE ONLY): 24 min ? ?Charges:  $Therapeutic Exercise: 8-22 mins ?$Therapeutic Activity: 8-22 mins          ?          ? ?Vickki Muff, PT, DPT  ? ?Acute Rehabilitation Department ?Pager #: [phone] ? ? ?Ronnie Derby ?02/28/2022, 1:33 PM ? ?

## 2022-02-28 NOTE — TOC Progression Note (Signed)
Transition of Care (TOC) - Progression Note  ? ? ?Patient Details  ?Name: [name] ?MRN: [id number] ?Date of Birth: 01/15/1951 ? ?Transition of Care (TOC) CM/SW Contact  ?Rikia Sukhu Wynn Banker, LCSWA ?Phone Number: ?02/28/2022, 1:23 PM ? ?Clinical Narrative:    ?Auth started [id number] , CSW informed SNF admin on pt DC status. ? ? ?Expected Discharge Plan: Skilled Nursing Facility ?Barriers to Discharge: Continued Medical Work up ? ?Expected Discharge Plan and Services ?Expected Discharge Plan: Skilled Nursing Facility ?In-house Referral: NA ?Discharge Planning Services: CM Consult ?  ?Living arrangements for the past 2 months: Hotel/Motel ?                ?  ?DME Agency: NA ?  ?  ?  ?  ?  ?  ?  ?  ? ? ?Social Determinants of Health (SDOH) Interventions ?  ? ?Readmission Risk Interventions ? ?  02/15/2022  ?  2:32 PM  ?Readmission Risk Prevention Plan  ?Transportation Screening Complete  ?PCP or Specialist Appt within 5-7 Days Complete  ? ? ?

## 2022-02-28 NOTE — Progress Notes (Signed)
Physical Therapy Treatment ?Patient Details ?Name: [name] ?MRN: [id number] ?DOB: [birth year] ?Today's Date: 02/28/2022 ? ? ?History of Present Illness 71 y.o. male presented to the ER on 02/13/2022 with weight gain and hypoxia. Pt with acute on chronic CHF and UTI. Transferred to [facility] on 4/17 and underwent heart cath on 4/18. PMH significant for A-fib, HFpEF, HTN, DMII, morbid obesity, venous stasis, lymphedema. ? ?  ?PT Comments  ? ? Pt seen for additional visit to attempt standing with use of sara plus machine for improved trunk extension and wt bearing through BLE. The pt was able to achieve more upright position for greater reps with use of this device, continues to need modA of 2 to facilitate hip lift and extension and third person to control sara plus. Tolerated x2 stand for 10-15 seconds and a third partial stand to reposition the recliner. The pt was left with all needs met and BLE elevated as high as the pt can tolerate with heat packs on bilateral knees to reduce pain. Will continue to benefit from skilled PT to progress strength, power, endurance, and capacity for transfers, and remains appropriate for SNF rehab at d/c.  ?  ?Recommendations for follow up therapy are one component of a multi-disciplinary discharge planning process, led by the attending physician.  Recommendations may be updated based on patient status, additional functional criteria and insurance authorization. ? ?Follow Up Recommendations ? Skilled nursing-short term rehab (<3 hours/day) ?  ?  ?Assistance Recommended at Discharge Frequent or constant Supervision/Assistance  ?Patient can return home with the following Two people to help with walking and/or transfers;Two people to help with bathing/dressing/bathroom;Assist for transportation;Help with stairs or ramp for entrance;Assistance with cooking/housework ?  ?Equipment Recommendations ? None recommended by PT  ?  ?Recommendations for Other Services   ? ? ?  ?Precautions /  Restrictions Precautions ?Precautions: Fall ?Precaution Comments: significant wounds BLE with weeping, scrotal swelling ?Restrictions ?Weight Bearing Restrictions: No  ?  ? ?Mobility ? Bed Mobility ?  ?  ?  ?  ?  ?  ?  ?General bed mobility comments: pt declining return to bed ?  ? ?Transfers ?Overall transfer level: Needs assistance ?Equipment used: Ambulation equipment used ?Transfers: Sit to/from Stand ?Sit to Stand: Mod assist, +2 physical assistance (+3 controlling sara plus) ?  ?  ?  ?  ?  ?General transfer comment: modA with use of sara plus. Pt able to complete x3 with slow rise completing ~75% of stand, still maintains slight trunk flexion, forward head posture, and wide BOS. Unable to reposition legs under himself, limited in standing tolerance by fatigue and pain. ?  ?  ? ? ?  ?Balance Overall balance assessment: Needs assistance ?Sitting-balance support: Feet supported ?Sitting balance-Leahy Scale: Fair ?Sitting balance - Comments: needs assist to pull forward to sitting on edge of recliner, then maintains without assist ?  ?Standing balance support: Bilateral upper extremity supported, Reliant on assistive device for balance, During functional activity ?Standing balance-Leahy Scale: Poor ?Standing balance comment: BUE support and modA of 2 to stand ?  ?  ?  ?  ?  ?  ?  ?  ?  ?  ?  ?  ? ?  ?Cognition Arousal/Alertness: Awake/alert ?Behavior During Therapy: [facility] for tasks assessed/performed ?Overall Cognitive Status: Within Functional Limits for tasks assessed ?  ?  ?  ?  ?  ?  ?  ?  ?  ?  ?  ?  ?  ?  ?  ?  ?  General Comments: pt pleasant and agreeable, benefits from cues ?  ?  ? ?  ?Exercises   ? ?  ?General Comments General comments (skin integrity, edema, etc.): VSS in session ?  ?  ? ?Pertinent Vitals/Pain Pain Assessment ?Pain Assessment: Faces ?Faces Pain Scale: Hurts whole lot ?Pain Location: bilateral knees with wt bearing ?Pain Descriptors / Indicators: Grimacing, Guarding ?Pain Intervention(s):  Monitored during session, Repositioned, Heat applied  ? ? ? ?PT Goals (current goals can now be found in the care plan section) Acute Rehab PT Goals ?Patient Stated Goal: get rollator brakes fixed and get rid of fluid ?PT Goal Formulation: With patient ?Time For Goal Achievement: 03/06/22 ?Potential to Achieve Goals: Fair ?Progress towards PT goals: Progressing toward goals ? ?  ?Frequency ? ? ? Min 3X/week ? ? ? ?  ?PT Plan Current plan remains appropriate  ? ? ?   ?AM-PAC PT "6 Clicks" Mobility   ?Outcome Measure ? Help needed turning from your back to your side while in a flat bed without using bedrails?: Total ?Help needed moving from lying on your back to sitting on the side of a flat bed without using bedrails?: Total ?Help needed moving to and from a bed to a chair (including a wheelchair)?: Total ?Help needed standing up from a chair using your arms (e.g., wheelchair or bedside chair)?: Total ?Help needed to walk in hospital room?: Total ?Help needed climbing 3-5 steps with a railing? : Total ?6 Click Score: 6 ? ?  ?End of Session Equipment Utilized During Treatment: Gait belt ?Activity Tolerance: Patient limited by fatigue ?Patient left: with call bell/phone within reach;in chair ?Nurse Communication: Mobility status ?PT Visit Diagnosis: Unsteadiness on feet (R26.81);Muscle weakness (generalized) (M62.81);Difficulty in walking, not elsewhere classified (R26.2) ?  ? ? ?Time: 5883-2549 ?PT Time Calculation (min) (ACUTE ONLY): 24 min ? ?Charges:  $Therapeutic Exercise: 23-37 mins          ?          ? ?West Carbo, PT, DPT  ? ?Acute Rehabilitation Department ?Pager #: [phone] - [phone] ? ? ?Sandra Cockayne ?02/28/2022, 5:30 PM ? ?

## 2022-02-28 NOTE — Progress Notes (Addendum)
?PROGRESS NOTE ? ? ? ?[name]  [id number] DOB: 07/03/1951 DOA: 02/13/2022 ?PCP: Tammi Sou, MD  ?[name] morbidly obese, BMI of 56, history of type 2 diabetes mellitus, chronic diastolic CHF, paroxysmal atrial fibrillation, hypertension presented to the ED with dyspnea and progressive weight gain of 25lbs over 4 weeks. ?Admitted with significant volume overload, ?-Diuresed with IV Lasix and treated for UTI as well ?-4/18 was transferred to [facility] from [facility] for cardiac work-up, right heart cath 4/18 noted marked volume overload, predominantly right heart failure, preserved cardiac output ?-4/19 started Lasix drip and metolazone ?-Cards following, now on Lasix 80 Mg twice daily, still significantly volume overloaded ? ?Subjective: ?-No events overnight, dressings on both legs changed yesterday, breathing about the same ? ?Assessment and Plan: ? ? Acute on chronic diastolic CHF (congestive heart failure) ([place]) ?Echo with EF 55 to 60% with moderate LVH, not well visualized RV ?-Diuresed on IV Lasix and Lasix gtt., he is 13.5 L negative ?-Right heart cath 4/18 noted markedly elevated filling pressures, R>L heart failure, preserved cardiac output  ?-Still significantly volume overloaded, now on Lasix 80 Mg IV twice daily ?-Remains on metolazone, albumin is low, add IV albumin to augment diuresis ?-GDMT limited by CKD, ?-Monitor urine output, BMP in a.m. ?-check am ABG to eval for hypercarbia, suspect he will benefit from CPAP ? ?Atrial fibrillation ([place]) ?-Remains in A-fib, heart rate controlled on Cardizem and Xarelto ? ?Acute kidney injury superimposed on chronic kidney disease ([place]) ?CKD stage 3b. Hyponatremia. Hypokalemia ?-Baseline creatinine around 1.6-1.8, creatinine has bumped up to 2.2 ?-Suspect cardiorenal etiology ?-Monitor with diuresis, may need milrinone ? ?Hypertension, essential ?-Stable, continue Imdur ? ?DM2 (diabetes mellitus, type 2) ([place]) ?Uncontrolled with hyperglycemia ?-Increase  insulin 70/30 dose further ? ?Lymphedema of both lower extremities ?Chronic wounds ?-Worsened in the setting of right heart failure ?-Completed 5 days of IV ceftriaxone for UTI and possible wound infection ?-Continue topical antifungal and local wound care, gets dressing changes every Wednesday ? ?UTI ?-Completed ABX therapy ? ?Morbid obesity ?Calculated BMI is 56.3.  ? ? ?DVT prophylaxis: Xarelto ?Code Status: DNR ?Family Communication: Discussed with patient in detail, no family at bedside ?Disposition Plan: SNF pending improvement in volume status ? ?Consultants:  ?Cardiology ? ?Procedures:  ? ?Antimicrobials:  ? ? ?Objective: ?Vitals:  ? 02/28/22 0000 02/28/22 0300 02/28/22 0506 02/28/22 1100  ?BP:  (!) 109/57  123/65  ?Pulse:  76  80  ?Resp: 14 19  20   ?Temp:  98.3 ?F (36.8 ?C)  98 ?F (36.7 ?C)  ?TempSrc:  Oral  Oral  ?SpO2:  90%  92%  ?Weight:   (!) 147.4 kg   ?Height:      ? ? ?Intake/Output Summary (Last 24 hours) at 02/28/2022 1406 ?Last data filed at 02/28/2022 0900 ?Gross per 24 hour  ?Intake 236.57 ml  ?Output 1200 ml  ?Net -963.43 ml  ? ?Filed Weights  ? 02/28/22 0506  ?Weight: (!) 147.4 kg  ? ? ?Examination: ? ?General exam: Chronically ill morbidly obese male sitting up in the recliner, AAOx3 ?CVS: S1-S2, irregularly irregular rhythm ?Lungs: Poor air movement bilaterally ?Abdomen: Soft, obese, nontender, significant abdominal wall edema noted ?Extremities: 3+ edema, bilateral lower leg dressing ?Psychiatry:  Mood & affect appropriate.  ? ? ? ?Data Reviewed:  ? ?CBC: ?Recent Labs  ?Lab 02/24/22 ?0530 02/27/22 ?0510 02/28/22 ?0630  ?WBC 8.5 8.6 8.2  ?HGB 9.4* 10.2* 10.2*  ?HCT 30.2* 31.8* 31.5*  ?MCV 72.4* 70.4* 70.3*  ?PLT  284 341 372  ? ?Basic Metabolic Panel: ?Recent Labs  ?Lab 02/22/22 ?[id number] 02/23/22 ?0440 02/24/22 ?0530 02/25/22 ?[id number] 02/26/22 ?[id number] 02/27/22 ?0510 02/28/22 ?0615  ?NA 135 135 134* 134* 131* 132* 134*  ?K 3.8 3.6 3.8 4.0 3.7 3.8 3.7  ?CL 95* 94* 92* 91* 86* 88* 90*  ?CO2 32 32 31 33*  33* 33* 33*  ?GLUCOSE 162* 183* 193* 187* 227* 215* 194*  ?BUN 61* 71* 79* 86* 91* 92* 97*  ?CREATININE 1.90* 1.88* 1.74* 1.92* 1.91* 2.22* 2.28*  ?CALCIUM 8.8* 8.6* 8.6* 8.8* 8.7* 8.8* 8.7*  ?MG 2.3 2.2 2.1 2.2  --  2.2  --   ? ?GFR: ?Estimated Creatinine Clearance: 43.8 mL/min (A) (by C-G formula based on SCr of 2.28 mg/dL (H)). ?Liver Function Tests: ?Recent Labs  ?Lab 02/28/22 ?0615  ?AST 25  ?ALT 26  ?ALKPHOS 96  ?BILITOT 0.7  ?PROT 7.1  ?ALBUMIN 2.8*  ? ?No results for input(s): LIPASE, AMYLASE in the last 168 hours. ?No results for input(s): AMMONIA in the last 168 hours. ?Coagulation Profile: ?No results for input(s): INR, PROTIME in the last 168 hours. ?Cardiac Enzymes: ?No results for input(s): CKTOTAL, CKMB, CKMBINDEX, TROPONINI in the last 168 hours. ?BNP (last 3 results) ?No results for input(s): PROBNP in the last 8760 hours. ?HbA1C: ?No results for input(s): HGBA1C in the last 72 hours. ?CBG: ?Recent Labs  ?Lab 02/27/22 ?1628 02/27/22 ?2217 02/28/22 ?0411 02/28/22 ?[id number] 02/28/22 ?1059  ?GLUCAP 172* 222* 190* 201* 229*  ? ?Lipid Profile: ?No results for input(s): CHOL, HDL, LDLCALC, TRIG, CHOLHDL, LDLDIRECT in the last 72 hours. ?Thyroid Function Tests: ?No results for input(s): TSH, T4TOTAL, FREET4, T3FREE, THYROIDAB in the last 72 hours. ?Anemia Panel: ?No results for input(s): VITAMINB12, FOLATE, FERRITIN, TIBC, IRON, RETICCTPCT in the last 72 hours. ?Urine analysis: ?   ?Component Value Date/Time  ? COLORURINE YELLOW 02/25/2022 1636  ? APPEARANCEUR HAZY (A) 02/25/2022 1636  ? LABSPEC 1.010 02/25/2022 1636  ? PHURINE 5.0 02/25/2022 1636  ? GLUCOSEU NEGATIVE 02/25/2022 1636  ? GLUCOSEU NEGATIVE 10/18/2020 1326  ? HGBUR LARGE (A) 02/25/2022 1636  ? [employer] NEGATIVE 02/25/2022 1636  ? Benjamin Stain NEGATIVE 02/25/2022 1636  ? PROTEINUR NEGATIVE 02/25/2022 1636  ? UROBILINOGEN 1.0 10/18/2020 1326  ? NITRITE NEGATIVE 02/25/2022 1636  ? LEUKOCYTESUR MODERATE (A) 02/25/2022 1636  ? ?Sepsis  Labs: ?@LABRCNTIP (procalcitonin:4,lacticidven:4) ? ?)No results found for this or any previous visit (from the past 240 hour(s)).  ? ?Radiology Studies: ?No results found. ? ? ?Scheduled Meds: ? acetaZOLAMIDE  250 mg Oral Daily  ? Chlorhexidine Gluconate Cloth  6 each Topical Daily  ? diltiazem  120 mg Oral Daily  ? furosemide  80 mg Intravenous BID  ? insulin aspart  0-15 Units Subcutaneous TID WC  ? insulin aspart  0-5 Units Subcutaneous QHS  ? insulin aspart protamine- aspart  26 Units Subcutaneous BID WC  ? isosorbide mononitrate  30 mg Oral Daily  ? loratadine  10 mg Oral Daily  ? multivitamin with minerals  1 tablet Oral Daily  ? nystatin cream   Topical BID  ? rivaroxaban  20 mg Oral Daily  ? terazosin  10 mg Oral QHS  ? ?Continuous Infusions: ? ? LOS: 15 days  ? ? ?Time spent: 28min ? ?Domenic Polite, MD ?[facility] ? ? ?02/28/2022, 2:06 PM  ?  ?

## 2022-02-28 NOTE — Progress Notes (Signed)
Mobility Specialist Progress Note: ? ? 02/28/22 1115  ?Mobility  ?Activity Transferred to/from [facility]  ?Level of Assistance Maximum assist, patient does 25-49% ?(+2)  ?Assistive Device Four wheel walker  ?Activity Response Tolerated fair  ?$Mobility charge 1 Mobility  ? ?Pt requesting to go to [facility] for BM. Required maxA+2 to stand from chair, and an extra person to move furniture. Pt left with call bell in reach.  ? ?[name] ?Acute Rehab ?Phone: [phone] ?Office Phone: [phone] ? ?

## 2022-02-28 NOTE — Patient Outreach (Signed)
[facility] Customer service manager [facility]) Care Management ? ?02/28/2022 ? ?Shelby Dubin [employer] ?May 24, 1951 ?[id number] ? ? ?Referral was received to follow up with member due to displacement.  Member was already inpatient when referral received, admitted on 4/12 for CHF and remains hospitalized today with plan for discharge to SNF once member is stable.  Will close case at this time, anticipate new referral pending disposition from SNF. ? ?Kemper Durie, RN, MSN, CCM ?[facility] Care Management  ?Community Care Manager ?[phone] ? ?

## 2022-03-01 DIAGNOSIS — I5033 Acute on chronic diastolic (congestive) heart failure: Secondary | ICD-10-CM | POA: Diagnosis not present

## 2022-03-01 LAB — BLOOD GAS, ARTERIAL
Acid-Base Excess: 8 mmol/L — ABNORMAL HIGH (ref 0.0–2.0)
Bicarbonate: 32 mmol/L — ABNORMAL HIGH (ref 20.0–28.0)
Drawn by: 36496
O2 Saturation: 96.7 %
Patient temperature: 37
pCO2 arterial: 41 mmHg (ref 32–48)
pH, Arterial: 7.5 — ABNORMAL HIGH (ref 7.35–7.45)
pO2, Arterial: 74 mmHg — ABNORMAL LOW (ref 83–108)

## 2022-03-01 LAB — BASIC METABOLIC PANEL
Anion gap: 12 (ref 5–15)
BUN: 99 mg/dL — ABNORMAL HIGH (ref 8–23)
CO2: 31 mmol/L (ref 22–32)
Calcium: 9 mg/dL (ref 8.9–10.3)
Chloride: 90 mmol/L — ABNORMAL LOW (ref 98–111)
Creatinine, Ser: 2.36 mg/dL — ABNORMAL HIGH (ref 0.61–1.24)
GFR, Estimated: 29 mL/min — ABNORMAL LOW (ref >60)
Glucose, Bld: 175 mg/dL — ABNORMAL HIGH (ref 70–99)
Potassium: 3.5 mmol/L (ref 3.5–5.1)
Sodium: 133 mmol/L — ABNORMAL LOW (ref 135–145)

## 2022-03-01 LAB — GLUCOSE, CAPILLARY
Glucose-Capillary: 169 mg/dL — ABNORMAL HIGH (ref 70–99)
Glucose-Capillary: 119 mg/dL — ABNORMAL HIGH (ref 70–99)
Glucose-Capillary: 159 mg/dL — ABNORMAL HIGH (ref 70–99)
Glucose-Capillary: 159 mg/dL — ABNORMAL HIGH (ref 70–99)

## 2022-03-01 MED ORDER — ISOSORBIDE MONONITRATE ER 30 MG PO TB24
15.0000 mg | ORAL_TABLET | Freq: Every day | ORAL | Status: DC
  Administered 2022-03-02 – 2022-03-07 (×6): 15 mg via ORAL
  Filled 2022-03-01 (×6): qty 1

## 2022-03-01 MED ORDER — ALBUMIN HUMAN 25 % IV SOLN
12.5000 g | Freq: Four times a day (QID) | INTRAVENOUS | Status: AC
  Administered 2022-03-01 (×2): 12.5 g via INTRAVENOUS
  Filled 2022-03-01 (×2): qty 50

## 2022-03-01 MED ORDER — RIVAROXABAN 15 MG PO TABS
15.0000 mg | ORAL_TABLET | Freq: Every day | ORAL | Status: DC
  Administered 2022-03-01: 15 mg via ORAL
  Filled 2022-03-01: qty 1

## 2022-03-01 NOTE — Progress Notes (Signed)
Mobility Specialist Progress Note: ? ? 03/01/22 1325  ?Mobility  ?Activity Dangled on edge of bed;Turned to back - supine  ?Level of Assistance Moderate assist, patient does 50-74%  ?Assistive Device None  ?Activity Response Tolerated fair  ?$Mobility charge 1 Mobility  ? ?Pt eager to return to supine in bed. Required modA to assist legs back to bed. Pt left with all needs met.  ? ?Nelta Numbers ?Acute Rehab ?Phone: [phone] ?Office Phone: [phone] ? ?

## 2022-03-01 NOTE — Progress Notes (Signed)
Mobility Specialist Progress Note: ? ? 03/01/22 1220  ?Mobility  ?Activity Dangled on edge of bed  ?Level of Assistance Moderate assist, patient does 50-74%  ?Assistive Device None  ?Activity Response Tolerated fair  ?$Mobility charge 1 Mobility  ? ?Pt requesting to sit EOB to urinate + eat lunch. Required modA to get EOB. Will f/u to get back this afternoon.  ? ?Addison Lank ?Acute Rehab ?Phone: [phone] ?Office Phone: [phone] ? ?

## 2022-03-01 NOTE — Progress Notes (Signed)
? ?Progress Note ? ?Patient Name: [name] ?Date of Encounter: 03/01/2022 ? ?[facility] Cardiologist: Pixie Casino, MD  ? ?Subjective  ? ?Still with poor mobility and LE edema  ? ?Inpatient Medications  ?  ?Scheduled Meds: ? acetaZOLAMIDE  250 mg Oral Daily  ? Chlorhexidine Gluconate Cloth  6 each Topical Daily  ? diltiazem  120 mg Oral Daily  ? furosemide  80 mg Intravenous BID  ? insulin aspart  0-15 Units Subcutaneous TID WC  ? insulin aspart  0-5 Units Subcutaneous QHS  ? insulin aspart protamine- aspart  30 Units Subcutaneous BID WC  ? isosorbide mononitrate  30 mg Oral Daily  ? loratadine  10 mg Oral Daily  ? multivitamin with minerals  1 tablet Oral Daily  ? nystatin cream   Topical BID  ? rivaroxaban  20 mg Oral Daily  ? terazosin  10 mg Oral QHS  ? ?Continuous Infusions: ? albumin human    ? ? ?PRN Meds: ?acetaminophen **OR** [DISCONTINUED] acetaminophen, albuterol, diclofenac Sodium, guaiFENesin, HYDROcodone-acetaminophen, metoprolol tartrate, ondansetron **OR** ondansetron (ZOFRAN) IV, ondansetron (ZOFRAN) IV, senna-docusate, sodium chloride flush  ? ?Vital Signs  ?  ?Vitals:  ? 02/28/22 1100 02/28/22 2020 03/01/22 0542 03/01/22 0549  ?BP: 123/65 (!) 141/80 139/68 139/68  ?Pulse: 80 67 67 67  ?Resp: 20 18 18  (!) 23  ?Temp: 98 ?F (36.7 ?C) 98 ?F (36.7 ?C) (!) 85 ?F (29.4 ?C) (!) 97 ?F (36.1 ?C)  ?TempSrc: Oral Oral Oral   ?SpO2: 92% 92% 92% 92%  ?Weight:   (!) 158.4 kg   ?Height:      ? ? ?Intake/Output Summary (Last 24 hours) at 03/01/2022 0914 ?Last data filed at 03/01/2022 0547 ?Gross per 24 hour  ?Intake 937 ml  ?Output 1950 ml  ?Net -1013 ml  ? ? ?  03/01/2022  ?  5:42 AM  ?Last 3 Weights  ?Weight (lbs) 349 lb 3.2 oz  ?Weight (kg) 158.396 kg  ?   ? ?Telemetry  ?  ?A fib with controlled VR - Personally Reviewed ? ?ECG  ?  ?No new tracing  - Personally Reviewed ? ?Physical Exam  ? ?GEN: No acute distress. Morbidly obese ?Neck: No JVD ?Cardiac: Irregularly irregular, no murmurs, rubs, or  gallops.  ?Respiratory: Clear but diminished to auscultation bilaterally ?GI: Soft, nontender, non-distended  ?MS: Marked edema of BLE up to scrotum , ACE wrap in place, foul odor and weeping drainage noted  ?Neuro:  Nonfocal  ?Psych: Normal affect  ? ?Labs  ?  ?High Sensitivity Troponin:   ?Recent Labs  ?Lab 02/13/22 ?1600 02/13/22 ?1800  ?TROPONINIHS 9 9  ?   ?Chemistry ?Recent Labs  ?Lab 02/24/22 ?0530 02/25/22 ?[id number] 02/26/22 ?[id number] 02/27/22 ?0510 02/28/22 ?0615 03/01/22 ?[id number]  ?NA 134* 134*   < > 132* 134* 133*  ?K 3.8 4.0   < > 3.8 3.7 3.5  ?CL 92* 91*   < > 88* 90* 90*  ?CO2 31 33*   < > 33* 33* 31  ?GLUCOSE 193* 187*   < > 215* 194* 175*  ?BUN 79* 86*   < > 92* 97* 99*  ?CREATININE 1.74* 1.92*   < > 2.22* 2.28* 2.36*  ?CALCIUM 8.6* 8.8*   < > 8.8* 8.7* 9.0  ?MG 2.1 2.2  --  2.2  --   --   ?PROT  --   --   --   --  7.1  --   ?ALBUMIN  --   --   --   --  2.8*  --   ?AST  --   --   --   --  25  --   ?ALT  --   --   --   --  26  --   ?ALKPHOS  --   --   --   --  96  --   ?BILITOT  --   --   --   --  0.7  --   ?GFRNONAA 42* 37*   < > 31* 30* 29*  ?ANIONGAP 11 10   < > 11 11 12   ? < > = values in this interval not displayed.  ?  ?Lipids No results for input(s): CHOL, TRIG, HDL, LABVLDL, LDLCALC, CHOLHDL in the last 168 hours.  ?Hematology ?Recent Labs  ?Lab 02/24/22 ?0530 02/27/22 ?0510 02/28/22 ?0630  ?WBC 8.5 8.6 8.2  ?RBC 4.17* 4.52 4.48  ?HGB 9.4* 10.2* 10.2*  ?HCT 30.2* 31.8* 31.5*  ?MCV 72.4* 70.4* 70.3*  ?MCH 22.5* 22.6* 22.8*  ?MCHC 31.1 32.1 32.4  ?RDW 18.6* 18.6* 18.8*  ?PLT 284 341 372  ? ?Thyroid No results for input(s): TSH, FREET4 in the last 168 hours.  ?BNPNo results for input(s): BNP, PROBNP in the last 168 hours.  ?DDimer No results for input(s): DDIMER in the last 168 hours.  ? ?Radiology  ?  ?No results found. ? ?Cardiac Studies  ? ?Right heart cath 02/19/22: ? ?Findings: ?  ?RA = 18 ?RV = 60/24 ?PA =  63/19 (35) ?PCW = 22  ?Fick cardiac output/index = 10.5/3.8 ?Thermo CO/CI = 7.2/2.6 ?PVR = 1.9  WU (Thermo) ?FA sat = 92% ?PA sat = 63%, 65% ?High SVC = 68% ?PAPi = 2.4 ?  ?Assessment: ?1. Marked volume overload with R>>L heart failure ?2. Preserved cardiac output ?  ?Plan/Discussion: ?  ?Remains markedly volume overloaded. Consider use of lasix gtt +/- acetazolamide or metolazone to facilitate diuresis. Keep LEs wrapped.  ?  ? ?Echo from 02/14/22: ? ? 1. Left ventricular ejection fraction, by estimation, is 55 to 60%. The  ?left ventricle has normal function. The left ventricle has no regional  ?wall motion abnormalities. There is moderate left ventricular hypertrophy.  ?Left ventricular diastolic function could not be evaluated.  ? 2. Right ventricular systolic function was not well visualized. The right  ?ventricular size is not well visualized.  ? 3. Left atrial size was mildly dilated.  ? 4. The mitral valve is normal in structure. No evidence of mitral valve  ?regurgitation.  ? 5. The aortic valve is normal in structure. There is mild calcification  ?of the aortic valve. Aortic valve regurgitation is trivial.  ? ? ?Patient Profile  ?   ?71 yo male with the PMH of atrial fibrillation, hypertension, combined heart failure, T2DM , CKD III, OSA, RLS, obesity, cardiology is following since 02/14/22 for CHF.  ? ?Assessment & Plan  ?  ?  ?Acute hypoxic respiratory failure, resolved  ?Acute on chronic diastolic heart failure ?Right heart failure ?- Echo from 02/14/22 showed EF 55 to 60%, unable assess diastolic function and RV ?- CXR with pulmonary vascular congestion, hazy bilateral opacities that may represent mild edema ?- BNP 294, but in the setting of morbid obesity ?- Right heart cath 02/19/22 with RA 18, RV 60/24, PA 63/19, PCW 22, Fick cardiac output/index = 10.5/3.8; marked right heart failure  ?- COOX better 67%  ?- Good diuresis now on 80 iv bid lasix Bicarb stable 32  ?- continues net negative balance  ? ?  AKI on CKD III ?- Cr 2.38  D/c zaroxyln  would continue current diuretics until Cr goes over 2.6  seems to be plateau  ?- clinically remains volume overloaded, lasix drip d/c on 80 bid lasix with > 1 L additional diuresis  ?  ?Persistent Afib ?-heart rate controlled ?-continue Cardizem CD 120mg  daily ?-Continue Xarelto ?  ?Type 2 DM with hyperglycemia  ?Lymphedema of BLE with cellulitis  ?Obesity  ?-managed per hospital medicine  ?  ? ?For questions or updates, please contact [place] ?Please consult [email or web address] for contact info under  ? ? ?   ?Signed, ?Jenkins Rouge, MD  ?03/01/2022, 9:14 AM   ? ? ? ?

## 2022-03-01 NOTE — Progress Notes (Signed)
Physical Therapy Treatment ?Patient Details ?Name: [name] ?MRN: [id number] ?DOB: Jul 05, 1951 ?Today's Date: 03/01/2022 ? ? ?History of Present Illness 71 y.o. male presented to the ER on 02/13/2022 with weight gain and hypoxia. Pt with acute on chronic CHF and UTI. Transferred to [facility] on 4/17 and underwent heart cath on 4/18. PMH significant for A-fib, HFpEF, HTN, DMII, morbid obesity, venous stasis, lymphedema. ? ?  ?PT Comments  ? ? The pt was agreeable to session with focus on ROM and elevation of BLE to better prepare for mobility session later this morning. He was able to complete AAROM with BLE, but is significantly limited in ROM at ankle and knee due to pain and significant swelling (<10 deg ROM). The pt was left with LE slightly elevated (unable to tolerate LE elevated with recliner) and educated at length on importance of elevation of BLE to reduce swelling and the impact this will have on pain and mobility. The pt was agreeable to maintain this position until therapists return for later mobility session.  ?  ?Recommendations for follow up therapy are one component of a multi-disciplinary discharge planning process, led by the attending physician.  Recommendations may be updated based on patient status, additional functional criteria and insurance authorization. ? ?Follow Up Recommendations ? Skilled nursing-short term rehab (<3 hours/day) ?  ?  ?Assistance Recommended at Discharge Frequent or constant Supervision/Assistance  ?Patient can return home with the following Two people to help with walking and/or transfers;Two people to help with bathing/dressing/bathroom;Assist for transportation;Help with stairs or ramp for entrance;Assistance with cooking/housework ?  ?Equipment Recommendations ? None recommended by PT  ?  ?Recommendations for Other Services   ? ? ?  ?Precautions / Restrictions Precautions ?Precautions: Fall ?Precaution Comments: significant wounds BLE with weeping, scrotal  swelling ?Restrictions ?Weight Bearing Restrictions: No  ?  ? ?Mobility ? Bed Mobility ?  ?  ?  ?  ?  ?  ?  ?General bed mobility comments: pt declining returning to bed ?  ? ?Transfers ?  ?  ?  ?  ?  ?  ?  ?  ?  ?General transfer comment: session focused on LE exercises and elevation in preparation for standing trial ?  ? ?  ?   ?Cognition Arousal/Alertness: Awake/alert ?Behavior During Therapy: [facility] for tasks assessed/performed ?Overall Cognitive Status: Within Functional Limits for tasks assessed ?  ?  ?  ?  ?  ?  ?  ?  ?  ?  ?  ?  ?  ?  ?  ?  ?General Comments: pt pleasant and agreeable, but needs max continued education regarding elevation of LE an importance of sleeping with legs in elevated position to reduce swelling ?  ?  ? ?  ?Exercises General Exercises - Lower Extremity ?Ankle Circles/Pumps: AROM, Both, 20 reps, Seated (3 x 20) ?Long Arc Quad: AAROM, Both, 20 reps, Seated (limited to partial ROM due to swelling, sets of 5.) ?Hip Flexion/Marching: AROM, Both, 20 reps, Seated (limited by body habitus and swelling) ?Toe Raises: AROM, Both, 20 reps, Seated ? ?  ?General Comments General comments (skin integrity, edema, etc.): VSS in session, BLE wrapped from 4/26. significant swelling in BLE, redness around edges of wraps ?  ?  ? ?Pertinent Vitals/Pain Pain Assessment ?Pain Assessment: Faces ?Faces Pain Scale: Hurts whole lot ?Pain Location: bilateral knees with wt bearing ?Pain Descriptors / Indicators: Grimacing, Guarding ?Pain Intervention(s): Monitored during session, Patient requesting pain meds-RN notified  ? ? ? ?PT  Goals (current goals can now be found in the care plan section) Acute Rehab PT Goals ?Patient Stated Goal: get rollator brakes fixed and get rid of fluid ?PT Goal Formulation: With patient ?Time For Goal Achievement: 03/06/22 ?Potential to Achieve Goals: Fair ?Progress towards PT goals: Progressing toward goals ? ?  ?Frequency ? ? ? Min 3X/week ? ? ? ?  ?PT Plan Current plan remains  appropriate  ? ? ?   ?AM-PAC PT "6 Clicks" Mobility   ?Outcome Measure ? Help needed turning from your back to your side while in a flat bed without using bedrails?: Total ?Help needed moving from lying on your back to sitting on the side of a flat bed without using bedrails?: Total ?Help needed moving to and from a bed to a chair (including a wheelchair)?: Total ?Help needed standing up from a chair using your arms (e.g., wheelchair or bedside chair)?: Total ?Help needed to walk in hospital room?: Total ?Help needed climbing 3-5 steps with a railing? : Total ?6 Click Score: 6 ? ?  ?End of Session   ?Activity Tolerance: Patient tolerated treatment well ?Patient left: in chair;with call bell/phone within reach (with LE elevated on trash can) ?Nurse Communication: Mobility status;Patient requests pain meds ?PT Visit Diagnosis: Unsteadiness on feet (R26.81);Muscle weakness (generalized) (M62.81);Difficulty in walking, not elsewhere classified (R26.2) ?  ? ? ?Time: 3785-8850 ?PT Time Calculation (min) (ACUTE ONLY): 18 min ? ?Charges:  $Therapeutic Exercise: 8-22 mins          ?          ? ?Vickki Muff, PT, DPT  ? ?Acute Rehabilitation Department ?Pager #: [phone] - [phone] ? ? ?Ronnie Derby ?03/01/2022, 9:23 AM ? ?

## 2022-03-01 NOTE — Progress Notes (Signed)
?PROGRESS NOTE ? ? ? ?[name]  [id number] DOB: 11-10-1950 DOA: 02/13/2022 ?PCP: Tammi Sou, MD  ?[name] morbidly obese, BMI of 56, history of type 2 diabetes mellitus, chronic diastolic CHF, paroxysmal atrial fibrillation, hypertension presented to the ED with dyspnea and progressive weight gain of 25lbs over 4 weeks. ?Admitted with significant volume overload, ?-Diuresed with IV Lasix and treated for UTI as well ?-4/18 was transferred to [facility] from [facility] for cardiac work-up, right heart cath 4/18 noted marked volume overload, predominantly right heart failure, preserved cardiac output ?-4/19 started Lasix drip and metolazone ?-Cards following, now on Lasix 80 Mg twice daily, still significantly volume overloaded ? ?Subjective: ?-Has chronic back pain, breathing improving, still tight abdomen, lower legs ? ?Assessment and Plan: ? ? Acute on chronic diastolic CHF (congestive heart failure) ([place]) ?Echo with EF 55 to 60% with moderate LVH, not well visualized RV ?-Diuresed on IV Lasix and Lasix gtt., he is 14.7 L negative, weight down 28 kg ?-Right heart cath 4/18 noted markedly elevated filling pressures, R>L heart failure, preserved cardiac output  ?-Still remains significantly volume overloaded, continue IV Lasix 80 Mg twice daily ?-Remains on metolazone, albumin is low, repeat IV albumin, mild bump in creatinine but not far from baseline ?-GDMT limited by CKD, ?-Monitor urine output, BMP in a.m. ?-ABG surprisingly without hypercarbia ?-Extremely limited mobility, overall prognosis is poor, plan for SNF eventually once volume status has improved further ? ?Atrial fibrillation ([place]) ?-Remains in A-fib, heart rate controlled on Cardizem and Xarelto ? ?Acute kidney injury superimposed on chronic kidney disease ([place]) ?CKD stage 3b. Hyponatremia. Hypokalemia ?-Baseline creatinine around 1.6-1.8, creatinine has bumped up to 2.3 ?-Suspect cardiorenal etiology ?-Monitor with diuresis, may need  milrinone ? ?Hypertension, essential ?-Stable, continue Imdur ? ?DM2 (diabetes mellitus, type 2) ([place]) ?-CBG stable now continue current dose of insulin 70/30 ? ?Lymphedema of both lower extremities ?Chronic wounds ?-Worsened in the setting of right heart failure ?-Completed 5 days of IV ceftriaxone for UTI and possible wound infection ?-Continue topical antifungal and local wound care, gets dressing changes every Wednesday ? ?UTI ?-Completed ABX therapy ? ?Morbid obesity ?Calculated BMI is 56.3.  ? ? ?DVT prophylaxis: Xarelto ?Code Status: DNR ?Family Communication: Discussed with patient in detail, no family at bedside ?Disposition Plan: SNF pending improvement in volume status ? ?Consultants:  ?Cardiology ? ?Procedures:  ? ?Antimicrobials:  ? ? ?Objective: ?Vitals:  ? 02/28/22 1100 02/28/22 2020 03/01/22 0542 03/01/22 0549  ?BP: 123/65 (!) 141/80 139/68 139/68  ?Pulse: 80 67 67 67  ?Resp: 20 18 18  (!) 23  ?Temp: 98 ?F (36.7 ?C) 98 ?F (36.7 ?C) (!) 85 ?F (29.4 ?C) (!) 97 ?F (36.1 ?C)  ?TempSrc: Oral Oral Oral   ?SpO2: 92% 92% 92% 92%  ?Weight:   (!) 158.4 kg   ?Height:      ? ? ?Intake/Output Summary (Last 24 hours) at 03/01/2022 1208 ?Last data filed at 03/01/2022 0547 ?Gross per 24 hour  ?Intake 937 ml  ?Output 1500 ml  ?Net -563 ml  ? ?Filed Weights  ? 03/01/22 0542  ?Weight: (!) 158.4 kg  ? ? ?Examination: ? ?General exam: Chronically ill morbidly obese male sitting up in the recliner, AAOx3, no distress ?CVS: S1-S2, irregularly irregular rhythm ?Lungs: Poor air movement bilaterally ?Abdomen: Soft, obese, nontender, significant abdominal wall edema, minimal improvement thus far ?Extremities: 2+ edema, bilateral lower leg wounds, excoriations, Unna boots on  ?Psychiatry:  Mood & affect appropriate.  ? ? ? ?Data  Reviewed:  ? ?CBC: ?Recent Labs  ?Lab 02/24/22 ?0530 02/27/22 ?0510 02/28/22 ?0630  ?WBC 8.5 8.6 8.2  ?HGB 9.4* 10.2* 10.2*  ?HCT 30.2* 31.8* 31.5*  ?MCV 72.4* 70.4* 70.3*  ?PLT 284 341 372  ? ?Basic  Metabolic Panel: ?Recent Labs  ?Lab 02/23/22 ?0440 02/24/22 ?0530 02/25/22 ?[id number] 02/26/22 ?[id number] 02/27/22 ?0510 02/28/22 ?0615 03/01/22 ?[id number]  ?NA 135 134* 134* 131* 132* 134* 133*  ?K 3.6 3.8 4.0 3.7 3.8 3.7 3.5  ?CL 94* 92* 91* 86* 88* 90* 90*  ?CO2 32 31 33* 33* 33* 33* 31  ?GLUCOSE 183* 193* 187* 227* 215* 194* 175*  ?BUN 71* 79* 86* 91* 92* 97* 99*  ?CREATININE 1.88* 1.74* 1.92* 1.91* 2.22* 2.28* 2.36*  ?CALCIUM 8.6* 8.6* 8.8* 8.7* 8.8* 8.7* 9.0  ?MG 2.2 2.1 2.2  --  2.2  --   --   ? ?GFR: ?Estimated Creatinine Clearance: 44.2 mL/min (A) (by C-G formula based on SCr of 2.36 mg/dL (H)). ?Liver Function Tests: ?Recent Labs  ?Lab 02/28/22 ?0615  ?AST 25  ?ALT 26  ?ALKPHOS 96  ?BILITOT 0.7  ?PROT 7.1  ?ALBUMIN 2.8*  ? ?No results for input(s): LIPASE, AMYLASE in the last 168 hours. ?No results for input(s): AMMONIA in the last 168 hours. ?Coagulation Profile: ?No results for input(s): INR, PROTIME in the last 168 hours. ?Cardiac Enzymes: ?No results for input(s): CKTOTAL, CKMB, CKMBINDEX, TROPONINI in the last 168 hours. ?BNP (last 3 results) ?No results for input(s): PROBNP in the last 8760 hours. ?HbA1C: ?No results for input(s): HGBA1C in the last 72 hours. ?CBG: ?Recent Labs  ?Lab 02/28/22 ?1059 02/28/22 ?1602 02/28/22 ?2117 03/01/22 ?0622 03/01/22 ?1206  ?GLUCAP 229* 152* 151* 169* 119*  ? ?Lipid Profile: ?No results for input(s): CHOL, HDL, LDLCALC, TRIG, CHOLHDL, LDLDIRECT in the last 72 hours. ?Thyroid Function Tests: ?No results for input(s): TSH, T4TOTAL, FREET4, T3FREE, THYROIDAB in the last 72 hours. ?Anemia Panel: ?No results for input(s): VITAMINB12, FOLATE, FERRITIN, TIBC, IRON, RETICCTPCT in the last 72 hours. ?Urine analysis: ?   ?Component Value Date/Time  ? COLORURINE YELLOW 02/25/2022 1636  ? APPEARANCEUR HAZY (A) 02/25/2022 1636  ? LABSPEC 1.010 02/25/2022 1636  ? PHURINE 5.0 02/25/2022 1636  ? GLUCOSEU NEGATIVE 02/25/2022 1636  ? GLUCOSEU NEGATIVE 10/18/2020 1326  ? HGBUR LARGE (A) 02/25/2022  1636  ? [place] NEGATIVE 02/25/2022 1636  ? Benjamin Stain NEGATIVE 02/25/2022 1636  ? PROTEINUR NEGATIVE 02/25/2022 1636  ? UROBILINOGEN 1.0 10/18/2020 1326  ? NITRITE NEGATIVE 02/25/2022 1636  ? LEUKOCYTESUR MODERATE (A) 02/25/2022 1636  ? ?Sepsis Labs: ?@LABRCNTIP (procalcitonin:4,lacticidven:4) ? ?)No results found for this or any previous visit (from the past 240 hour(s)).  ? ?Radiology Studies: ?No results found. ? ? ?Scheduled Meds: ? acetaZOLAMIDE  250 mg Oral Daily  ? Chlorhexidine Gluconate Cloth  6 each Topical Daily  ? diltiazem  120 mg Oral Daily  ? furosemide  80 mg Intravenous BID  ? insulin aspart  0-15 Units Subcutaneous TID WC  ? insulin aspart  0-5 Units Subcutaneous QHS  ? insulin aspart protamine- aspart  30 Units Subcutaneous BID WC  ? isosorbide mononitrate  30 mg Oral Daily  ? loratadine  10 mg Oral Daily  ? multivitamin with minerals  1 tablet Oral Daily  ? nystatin cream   Topical BID  ? rivaroxaban  15 mg Oral Daily  ? terazosin  10 mg Oral QHS  ? ?Continuous Infusions: ? albumin human 12.5 g (03/01/22 1027)  ? ? ? LOS: 16  days  ? ? ?Time spent: 15min ? ?Domenic Polite, MD ?[facility] ? ? ?03/01/2022, 12:08 PM  ?  ?

## 2022-03-01 NOTE — Progress Notes (Signed)
Physical Therapy Treatment ?Patient Details ?Name: [name] ?MRN: [id number] ?DOB: [birth year] ?Today's Date: 03/01/2022 ? ? ?History of Present Illness 71 y.o. male presented to the ER on 02/13/2022 with weight gain and hypoxia. Pt with acute on chronic CHF and UTI. Transferred to [facility] on 4/17 and underwent heart cath on 4/18. PMH significant for A-fib, HFpEF, HTN, DMII, morbid obesity, venous stasis, lymphedema. ? ?  ?PT Comments  ? ? The pt was seen for further mobility progression and attempt to complete sit-stand transfers with improved forward wt shift and wt bearing on BLE. With use of bed rail to pull forward and support his upper body, the pt was able to stand with maxA of 2 and use of chuck pad under his hips to facilitate hip extension and hip lift. Able to tolerate for 10-15 seconds at this time, limited by pain in bilateral knees with wt bearing. After session, pt lifted back to bed to facilitate LE elevation in attempts to better manage swelling. Will continue to benefit from skilled PT to progress mobility, strength, and activity tolerance.  ?  ?Recommendations for follow up therapy are one component of a multi-disciplinary discharge planning process, led by the attending physician.  Recommendations may be updated based on patient status, additional functional criteria and insurance authorization. ? ?Follow Up Recommendations ? Skilled nursing-short term rehab (<3 hours/day) ?  ?  ?Assistance Recommended at Discharge Frequent or constant Supervision/Assistance  ?Patient can return home with the following Two people to help with walking and/or transfers;Two people to help with bathing/dressing/bathroom;Assist for transportation;Help with stairs or ramp for entrance;Assistance with cooking/housework ?  ?Equipment Recommendations ? None recommended by PT  ?  ?Recommendations for Other Services   ? ? ?  ?Precautions / Restrictions Precautions ?Precautions: Fall ?Precaution Comments: significant wounds  BLE with weeping, scrotal swelling ?Restrictions ?Weight Bearing Restrictions: No  ?  ? ?Mobility ? Bed Mobility ?  ?  ?  ?  ?  ?  ?  ?General bed mobility comments: Use of hoyer for return to supine ?  ? ?Transfers ?Overall transfer level: Needs assistance ?Equipment used:  (bed rail) ?Transfers: Sit to/from Stand ?Sit to Stand: +2 physical assistance, Max assist ?  ?  ?  ?  ?  ?General transfer comment: Max A for power up and weight shift forward ?  ? ?  ?Balance Overall balance assessment: Needs assistance ?Sitting-balance support: Feet supported ?Sitting balance-Leahy Scale: Fair ?Sitting balance - Comments: needs assist to pull forward to sitting on edge of recliner, then maintains without assist ?  ?Standing balance support: Bilateral upper extremity supported, Reliant on assistive device for balance, During functional activity ?Standing balance-Leahy Scale: Poor ?Standing balance comment: BUE support and modA of 2 to stand ?  ?  ?  ?  ?  ?  ?  ?  ?  ?  ?  ?  ? ?  ?Cognition Arousal/Alertness: Awake/alert ?Behavior During Therapy: [facility] for tasks assessed/performed ?Overall Cognitive Status: Within Functional Limits for tasks assessed ?  ?  ?  ?  ?  ?  ?  ?  ?  ?  ?  ?  ?  ?  ?  ?  ?General Comments: pt pleasant and agreeable. Requiring increased time for processing ?  ?  ? ?  ?Exercises General Exercises - Lower Extremity ?Ankle Circles/Pumps: AROM, Both, 20 reps, Seated (3 x 20) ?Long Arc Quad: AAROM, Both, 20 reps, Seated (limited to partial ROM due to swelling, sets  of 5.) ?Hip Flexion/Marching: AROM, Both, 20 reps, Seated (limited by body habitus and swelling) ?Toe Raises: AROM, Both, 20 reps, Seated ? ?  ?General Comments General comments (skin integrity, edema, etc.): VSS ?  ?  ? ?Pertinent Vitals/Pain Pain Assessment ?Pain Assessment: Faces ?Faces Pain Scale: Hurts whole lot ?Pain Location: bilateral knees with wt bearing ?Pain Descriptors / Indicators: Grimacing, Guarding ?Pain Intervention(s):  Monitored during session, Premedicated before session  ? ? ? ?PT Goals (current goals can now be found in the care plan section) Acute Rehab PT Goals ?Patient Stated Goal: get rollator brakes fixed and get rid of fluid ?PT Goal Formulation: With patient ?Time For Goal Achievement: 03/06/22 ?Potential to Achieve Goals: Fair ?Progress towards PT goals: Progressing toward goals ? ?  ?Frequency ? ? ? Min 3X/week ? ? ? ?  ?PT Plan Current plan remains appropriate  ? ? ?Co-evaluation PT/OT/SLP Co-Evaluation/Treatment: Yes ?Reason for Co-Treatment: For patient/therapist safety;To address functional/ADL transfers ?PT goals addressed during session: Mobility/safety with mobility;Balance;Strengthening/ROM ?OT goals addressed during session: ADL's and self-care ?  ? ?  ?AM-PAC PT "6 Clicks" Mobility   ?Outcome Measure ? Help needed turning from your back to your side while in a flat bed without using bedrails?: Total ?Help needed moving from lying on your back to sitting on the side of a flat bed without using bedrails?: Total ?Help needed moving to and from a bed to a chair (including a wheelchair)?: Total ?Help needed standing up from a chair using your arms (e.g., wheelchair or bedside chair)?: Total ?Help needed to walk in hospital room?: Total ?Help needed climbing 3-5 steps with a railing? : Total ?6 Click Score: 6 ? ?  ?End of Session Equipment Utilized During Treatment: Gait belt (scrotal sling) ?Activity Tolerance: Patient tolerated treatment well ?Patient left: with call bell/phone within reach;in bed ?Nurse Communication: Mobility status ?PT Visit Diagnosis: Unsteadiness on feet (R26.81);Muscle weakness (generalized) (M62.81);Difficulty in walking, not elsewhere classified (R26.2) ?  ? ? ?Time: 1011-1031 (plus 9:25-9:40 (15 min)) ?PT Time Calculation (min) (ACUTE ONLY): 20 min ? ?Charges:  $Therapeutic Exercise: 8-22 mins          ?          ? ?West Carbo, PT, DPT  ? ?Acute Rehabilitation Department ?Pager #: [phone] - [phone] ? ? ?Sandra Cockayne ?03/01/2022, 2:08 PM ? ?

## 2022-03-01 NOTE — Progress Notes (Signed)
Pt refused CPAP

## 2022-03-01 NOTE — Progress Notes (Signed)
Occupational Therapy Treatment ?Patient Details ?Name: Kevin DubinJesse James [name] ?MRN: [id number] ?DOB: 01/26/1951 ?Today's Date: 03/01/2022 ? ? ?History of present illness 71 y.o. male presented to the ER on 02/13/2022 with weight gain and hypoxia. Pt with acute on chronic CHF and UTI. [facility] and underwent heart cath on 4/18. PMH significant for A-fib, HFpEF, HTN, DMII, morbid obesity, venous stasis, lymphedema. ?  ?OT comments ? Pt continues to present with decreased balance, strength, and activity tolerance. Pt reporting that he lifted to recliner with night shift for cleaning because he is scared about peeing in the bed (and can sit up to pee at the recliner). Discussing how this does not improve his edema at scrotum and BLEs. Pt attempting initial sit<>stand from recliner with Max A +2 and rollator; however, pt not weight shifting forward to progress into upright posture. Performing sit<>stand with recliner face bed frame (bedrail elevated for handle) and pt practicing weight shift forward into EOB with Max A +2. However, continues to present with poor standing tolerance. In final stand, placing lift pad at recliner. Use of Maxi-sky for transfer back to bed. Pt with concern for urination at bed level as he relies on urinating in bucket while seated. Will continue to assess and follow acutely. Continue to recommend dc to SNF for post-acute rehab .  ? ?Recommendations for follow up therapy are one component of a multi-disciplinary discharge planning process, led by the attending physician.  Recommendations may be updated based on patient status, additional functional criteria and insurance authorization. ?   ?Follow Up Recommendations ? Skilled nursing-short term rehab (<3 hours/day)  ?  ?Assistance Recommended at Discharge Frequent or constant Supervision/Assistance  ?Patient can return home with the following ? Two people to help with walking and/or transfers;Two people to help with  bathing/dressing/bathroom ?  ?Equipment Recommendations ? Other (comment) (TBD pending progress)  ?  ?Recommendations for Other Services   ? ?  ?Precautions / Restrictions Precautions ?Precautions: Fall ?Precaution Comments: significant wounds BLE with weeping, scrotal swelling ?Restrictions ?Weight Bearing Restrictions: No  ? ? ?  ? ?Mobility Bed Mobility ?  ?  ?  ?  ?  ?  ?  ?General bed mobility comments: Use of hoyer for return to supine ?  ? ?Transfers ?Overall transfer level: Needs assistance ?Equipment used: Ambulation equipment used ?Transfers: Sit to/from Stand ?Sit to Stand: +2 physical assistance, Max assist ?  ?  ?  ?  ?  ?General transfer comment: Max A for power up and weight shift forward ?  ?  ?Balance Overall balance assessment: Needs assistance ?Sitting-balance support: Feet supported ?Sitting balance-Leahy Scale: Fair ?Sitting balance - Comments: needs assist to pull forward to sitting on edge of recliner, then maintains without assist ?  ?Standing balance support: Bilateral upper extremity supported, Reliant on assistive device for balance, During functional activity ?Standing balance-Leahy Scale: Poor ?Standing balance comment: BUE support and modA of 2 to stand ?  ?  ?  ?  ?  ?  ?  ?  ?  ?  ?  ?   ? ?ADL either performed or assessed with clinical judgement  ? ?ADL Overall ADL's : Needs assistance/impaired ?  ?  ?  ?  ?  ?  ?  ?  ?  ?  ?Lower Body Dressing: Total assistance;+2 for physical assistance;+2 for safety/equipment;Sitting/lateral leans;Sit to/from stand ?Lower Body Dressing Details (indicate cue type and reason): don socks ?Toilet Transfer: Maximal assistance;+2 for physical assistance (sit<>stand) ?  ?  Toileting- Clothing Manipulation and Hygiene: Total assistance;+2 for physical assistance;+2 for safety/equipment;Sitting/lateral lean;Sit to/from stand ?  ?  ?  ?Functional mobility during ADLs: Maximal assistance;+2 for physical assistance (sit<>stand) ?General ADL Comments: Focusing  session on sit<>stand trials to achieve forward weight shift. donning scrotal sling for edema management. Upon arrival, pt having slept in recliner with both scrotum and BLEs in dependent position. ?  ? ?Extremity/Trunk Assessment Upper Extremity Assessment ?Upper Extremity Assessment: LUE deficits/detail ?LUE Deficits / Details: reports feeling like he injured L shoulder when pulling on bar at [facility] ?  ?Lower Extremity Assessment ?Lower Extremity Assessment: Defer to PT evaluation ?  ?  ?  ? ?Vision   ?Vision Assessment?: No apparent visual deficits ?  ?Perception   ?  ?Praxis   ?  ? ?Cognition Arousal/Alertness: Awake/alert ?Behavior During Therapy: [facility] for tasks assessed/performed ?Overall Cognitive Status: Within Functional Limits for tasks assessed ?  ?  ?  ?  ?  ?  ?  ?  ?  ?  ?  ?  ?  ?  ?  ?  ?General Comments: pt pleasant and agreeable. Requiring increased time for processing ?  ?  ?   ?Exercises   ? ?  ?Shoulder Instructions   ? ? ?  ?General Comments VSS  ? ? ?Pertinent Vitals/ Pain       Pain Assessment ?Pain Assessment: Faces ?Faces Pain Scale: Hurts whole lot ?Pain Location: bilateral knees with wt bearing ?Pain Descriptors / Indicators: Grimacing, Guarding ?Pain Intervention(s): Monitored during session, Limited activity within patient's tolerance, Repositioned ? ?Home Living   ?  ?  ?  ?  ?  ?  ?  ?  ?  ?  ?  ?  ?  ?  ?  ?  ?  ?  ? ?  ?Prior Functioning/Environment    ?  ?  ?  ?   ? ?Frequency ? Min 2X/week  ? ? ? ? ?  ?Progress Toward Goals ? ?OT Goals(current goals can now be found in the care plan section) ? Progress towards OT goals: Progressing toward goals ? ?Acute Rehab OT Goals ?OT Goal Formulation: With patient ?Time For Goal Achievement: 03/07/22 ?Potential to Achieve Goals: Good ?ADL Goals ?Pt Will Perform Lower Body Bathing: with mod assist;sit to/from stand;sitting/lateral leans ?Pt Will Perform Lower Body Dressing: with mod assist;sit to/from stand;sitting/lateral leans ?Pt Will Transfer  to Toilet: with max assist;with +2 assist;stand pivot transfer;bedside commode ?Pt/caregiver will Perform Home Exercise Program: Increased strength;Both right and left upper extremity;With theraband;Independently;With written HEP provided  ?Plan Discharge plan remains appropriate   ? ?Co-evaluation ? ? ? PT/OT/SLP Co-Evaluation/Treatment: Yes ?Reason for Co-Treatment: To address functional/ADL transfers;For patient/therapist safety ?  ?OT goals addressed during session: ADL's and self-care ?  ? ?  ?AM-PAC OT "6 Clicks" Daily Activity     ?Outcome Measure ? ? Help from another person eating meals?: None ?Help from another person taking care of personal grooming?: A Little ?Help from another person toileting, which includes using toliet, bedpan, or urinal?: Total ?Help from another person bathing (including washing, rinsing, drying)?: A Lot ?Help from another person to put on and taking off regular upper body clothing?: A Little ?Help from another person to put on and taking off regular lower body clothing?: Total ?6 Click Score: 14 ? ?  ?End of Session Equipment Utilized During Treatment: Other (comment);Gait belt (scrotal sling) ? ?OT Visit Diagnosis: Unsteadiness on feet (R26.81);Other abnormalities of gait and mobility (  R26.89) ?  ?Activity Tolerance Patient tolerated treatment well ?  ?Patient Left in bed;with call bell/phone within reach ?  ?Nurse Communication Mobility status;Other (comment) (skin integrity, sling mgmt) ?  ? ?   ? ?Time: 0867-6195 ?OT Time Calculation (min): 73 min ? ?Charges: OT General Charges ?$OT Visit: 1 Visit ?OT Treatments ?$Self Care/Home Management : 23-37 mins ? ?Levin Dagostino MSOT, OTR/L ?Acute Rehab ?Pager: [phone] ?Office: [phone] ? ?Shondell Fabel M Adlai Nieblas ?03/01/2022, 2:00 PM ? ? ?

## 2022-03-02 DIAGNOSIS — I5033 Acute on chronic diastolic (congestive) heart failure: Secondary | ICD-10-CM | POA: Diagnosis not present

## 2022-03-02 LAB — CBC
HCT: 31.4 % — ABNORMAL LOW (ref 39.0–52.0)
Hemoglobin: 9.7 g/dL — ABNORMAL LOW (ref 13.0–17.0)
MCH: 22.1 pg — ABNORMAL LOW (ref 26.0–34.0)
MCHC: 30.9 g/dL (ref 30.0–36.0)
MCV: 71.7 fL — ABNORMAL LOW (ref 80.0–100.0)
Platelets: 420 10*3/uL — ABNORMAL HIGH (ref 150–400)
RBC: 4.38 MIL/uL (ref 4.22–5.81)
RDW: 19.3 % — ABNORMAL HIGH (ref 11.5–15.5)
WBC: 8.4 10*3/uL (ref 4.0–10.5)
nRBC: 0 % (ref 0.0–0.2)

## 2022-03-02 LAB — BASIC METABOLIC PANEL
Anion gap: 11 (ref 5–15)
BUN: 91 mg/dL — ABNORMAL HIGH (ref 8–23)
CO2: 32 mmol/L (ref 22–32)
Calcium: 8.9 mg/dL (ref 8.9–10.3)
Chloride: 92 mmol/L — ABNORMAL LOW (ref 98–111)
Creatinine, Ser: 2.04 mg/dL — ABNORMAL HIGH (ref 0.61–1.24)
GFR, Estimated: 34 mL/min — ABNORMAL LOW (ref >60)
Glucose, Bld: 151 mg/dL — ABNORMAL HIGH (ref 70–99)
Potassium: 3.4 mmol/L — ABNORMAL LOW (ref 3.5–5.1)
Sodium: 135 mmol/L (ref 135–145)

## 2022-03-02 LAB — GLUCOSE, CAPILLARY
Glucose-Capillary: 141 mg/dL — ABNORMAL HIGH (ref 70–99)
Glucose-Capillary: 189 mg/dL — ABNORMAL HIGH (ref 70–99)
Glucose-Capillary: 179 mg/dL — ABNORMAL HIGH (ref 70–99)
Glucose-Capillary: 148 mg/dL — ABNORMAL HIGH (ref 70–99)

## 2022-03-02 LAB — COOXEMETRY PANEL
Carboxyhemoglobin: 1.7 % — ABNORMAL HIGH (ref 0.5–1.5)
Methemoglobin: <0.7 % (ref 0.0–1.5)
O2 Saturation: 57.1 %
Total hemoglobin: 10 g/dL — ABNORMAL LOW (ref 12.0–16.0)

## 2022-03-02 MED ORDER — POTASSIUM CHLORIDE CRYS ER 20 MEQ PO TBCR
40.0000 meq | EXTENDED_RELEASE_TABLET | Freq: Two times a day (BID) | ORAL | Status: AC
  Administered 2022-03-02 (×2): 40 meq via ORAL
  Filled 2022-03-02 (×2): qty 2

## 2022-03-02 MED ORDER — RIVAROXABAN 20 MG PO TABS
20.0000 mg | ORAL_TABLET | Freq: Every day | ORAL | Status: DC
  Administered 2022-03-02 – 2022-03-06 (×5): 20 mg via ORAL
  Filled 2022-03-02 (×5): qty 1

## 2022-03-02 NOTE — Plan of Care (Signed)
?  Problem: Health Behavior/Discharge Planning: ?Goal: Ability to manage health-related needs will improve ?Outcome: Progressing ?  ?Problem: Clinical Measurements: ?Goal: Ability to maintain clinical measurements within normal limits will improve ?Outcome: Progressing ?Goal: Respiratory complications will improve ?Outcome: Progressing ?Goal: Cardiovascular complication will be avoided ?Outcome: Progressing ?  ?Problem: Activity: ?Goal: Risk for activity intolerance will decrease ?Outcome: Progressing ?  ?Problem: Nutrition: ?Goal: Adequate nutrition will be maintained ?Outcome: Progressing ?  ?Problem: Coping: ?Goal: Level of anxiety will decrease ?Outcome: Progressing ?  ?Problem: Elimination: ?Goal: Will not experience complications related to bowel motility ?Outcome: Progressing ?Goal: Will not experience complications related to urinary retention ?Outcome: Progressing ?  ?Problem: Pain Managment: ?Goal: General experience of comfort will improve ?Outcome: Progressing ?  ?

## 2022-03-02 NOTE — Progress Notes (Signed)
Pt refuses CPAP and states he "slept fairly well last night without one". Pt also states he is "unsure of why they keep thinking I need one" and states he has not had a sleep study in years.  ?

## 2022-03-02 NOTE — Progress Notes (Signed)
? ?Progress Note ? ?Patient Name: [name] ?Date of Encounter: 03/02/2022 ? ?[facility] Cardiologist: Chrystie NoseKenneth C Hilty, MD  ? ?Subjective  ? ?Still with poor mobility and LE edema but slightly better  ? ?Inpatient Medications  ?  ?Scheduled Meds: ? acetaZOLAMIDE  250 mg Oral Daily  ? Chlorhexidine Gluconate Cloth  6 each Topical Daily  ? diltiazem  120 mg Oral Daily  ? furosemide  80 mg Intravenous BID  ? insulin aspart  0-15 Units Subcutaneous TID WC  ? insulin aspart  0-5 Units Subcutaneous QHS  ? insulin aspart protamine- aspart  30 Units Subcutaneous BID WC  ? isosorbide mononitrate  15 mg Oral Daily  ? loratadine  10 mg Oral Daily  ? multivitamin with minerals  1 tablet Oral Daily  ? nystatin cream   Topical BID  ? potassium chloride  40 mEq Oral BID  ? rivaroxaban  15 mg Oral Daily  ? terazosin  10 mg Oral QHS  ? ?Continuous Infusions: ? ? ? ?PRN Meds: ?acetaminophen **OR** [DISCONTINUED] acetaminophen, albuterol, diclofenac Sodium, guaiFENesin, HYDROcodone-acetaminophen, metoprolol tartrate, ondansetron **OR** ondansetron (ZOFRAN) IV, ondansetron (ZOFRAN) IV, senna-docusate, sodium chloride flush  ? ?Vital Signs  ?  ?Vitals:  ? 03/01/22 2214 03/01/22 2215 03/01/22 2221 03/02/22 0311  ?BP:   (!) 128/56 120/68  ?Pulse:   68 78  ?Resp: (!) 21 (!) 26 (!) 21 16  ?Temp:   98 ?F (36.7 ?C) 98.2 ?F (36.8 ?C)  ?TempSrc:   Oral Oral  ?SpO2:   92% 92%  ?Weight:    (!) 158.9 kg  ?Height:      ? ? ?Intake/Output Summary (Last 24 hours) at 03/02/2022 0806 ?Last data filed at 03/02/2022 [id number] ?Gross per 24 hour  ?Intake 799.77 ml  ?Output 3600 ml  ?Net -2800.23 ml  ? ? ?  03/02/2022  ?  3:11 AM 03/01/2022  ?  5:42 AM  ?Last 3 Weights  ?Weight (lbs) 350 lb 5 oz 349 lb 3.2 oz  ?Weight (kg) 158.9 kg 158.396 kg  ?   ? ?Telemetry  ?  ?A fib with controlled VR - Personally Reviewed ? ?ECG  ?  ?No new tracing  - Personally Reviewed ? ?Physical Exam  ? ?GEN: No acute distress. Morbidly obese ?Neck: No JVD ?Cardiac:  Irregularly irregular, no murmurs, rubs, or gallops.  ?Respiratory: Clear but diminished to auscultation bilaterally ?GI: Soft, nontender, non-distended  ?MS: Marked edema of BLE up to scrotum , ACE wrap in place, foul odor and weeping drainage noted  ?Neuro:  Nonfocal  ?Psych: Normal affect  ? ?Labs  ?  ?High Sensitivity Troponin:   ?Recent Labs  ?Lab 02/13/22 ?1600 02/13/22 ?1800  ?TROPONINIHS 9 9  ?   ?Chemistry ?Recent Labs  ?Lab 02/24/22 ?0530 02/25/22 ?[id number] 02/26/22 ?[id number] 02/27/22 ?0510 02/28/22 ?0615 03/01/22 ?[id number] 03/02/22 ?0615  ?NA 134* 134*   < > 132* 134* 133* 135  ?K 3.8 4.0   < > 3.8 3.7 3.5 3.4*  ?CL 92* 91*   < > 88* 90* 90* 92*  ?CO2 31 33*   < > 33* 33* 31 32  ?GLUCOSE 193* 187*   < > 215* 194* 175* 151*  ?BUN 79* 86*   < > 92* 97* 99* 91*  ?CREATININE 1.74* 1.92*   < > 2.22* 2.28* 2.36* 2.04*  ?CALCIUM 8.6* 8.8*   < > 8.8* 8.7* 9.0 8.9  ?MG 2.1 2.2  --  2.2  --   --   --   ?  PROT  --   --   --   --  7.1  --   --   ?ALBUMIN  --   --   --   --  2.8*  --   --   ?AST  --   --   --   --  25  --   --   ?ALT  --   --   --   --  26  --   --   ?ALKPHOS  --   --   --   --  96  --   --   ?BILITOT  --   --   --   --  0.7  --   --   ?GFRNONAA 42* 37*   < > 31* 30* 29* 34*  ?ANIONGAP 11 10   < > 11 11 12 11   ? < > = values in this interval not displayed.  ?  ?Lipids No results for input(s): CHOL, TRIG, HDL, LABVLDL, LDLCALC, CHOLHDL in the last 168 hours.  ?Hematology ?Recent Labs  ?Lab 02/27/22 ?0510 02/28/22 ?0630 03/02/22 ?0615  ?WBC 8.6 8.2 8.4  ?RBC 4.52 4.48 4.38  ?HGB 10.2* 10.2* 9.7*  ?HCT 31.8* 31.5* 31.4*  ?MCV 70.4* 70.3* 71.7*  ?MCH 22.6* 22.8* 22.1*  ?MCHC 32.1 32.4 30.9  ?RDW 18.6* 18.8* 19.3*  ?PLT 341 372 420*  ? ?Thyroid No results for input(s): TSH, FREET4 in the last 168 hours.  ?BNPNo results for input(s): BNP, PROBNP in the last 168 hours.  ?DDimer No results for input(s): DDIMER in the last 168 hours.  ? ?Radiology  ?  ?No results found. ? ?Cardiac Studies  ? ?Right heart cath  02/19/22: ? ?Findings: ?  ?RA = 18 ?RV = 60/24 ?PA =  63/19 (35) ?PCW = 22  ?Fick cardiac output/index = 10.5/3.8 ?Thermo CO/CI = 7.2/2.6 ?PVR = 1.9 WU (Thermo) ?FA sat = 92% ?PA sat = 63%, 65% ?High SVC = 68% ?PAPi = 2.4 ?  ?Assessment: ?1. Marked volume overload with R>>L heart failure ?2. Preserved cardiac output ?  ?Plan/Discussion: ?  ?Remains markedly volume overloaded. Consider use of lasix gtt +/- acetazolamide or metolazone to facilitate diuresis. Keep LEs wrapped.  ?  ? ?Echo from 02/14/22: ? ? 1. Left ventricular ejection fraction, by estimation, is 55 to 60%. The  ?left ventricle has normal function. The left ventricle has no regional  ?wall motion abnormalities. There is moderate left ventricular hypertrophy.  ?Left ventricular diastolic function could not be evaluated.  ? 2. Right ventricular systolic function was not well visualized. The right  ?ventricular size is not well visualized.  ? 3. Left atrial size was mildly dilated.  ? 4. The mitral valve is normal in structure. No evidence of mitral valve  ?regurgitation.  ? 5. The aortic valve is normal in structure. There is mild calcification  ?of the aortic valve. Aortic valve regurgitation is trivial.  ? ? ?Patient Profile  ?   ?71 yo male with the PMH of atrial fibrillation, hypertension, combined heart failure, T2DM , CKD III, OSA, RLS, obesity, cardiology is following since 02/14/22 for CHF.  ? ?Assessment & Plan  ?  ?  ?Acute hypoxic respiratory failure, resolved  ?Acute on chronic diastolic heart failure ?Right heart failure ?- Echo from 02/14/22 showed EF 55 to 60%, unable assess diastolic function and RV ?- CXR with pulmonary vascular congestion, hazy bilateral opacities that may represent mild edema ?- BNP 294, but in the setting  of morbid obesity ?- Right heart cath 02/19/22 with RA 18, RV 60/24, PA 63/19, PCW 22, Fick cardiac output/index = 10.5/3.8; marked right heart failure  ?- COOX better 67%  ?- Good diuresis now on 80 iv bid lasix Bicarb  stable 32  ?- continues net negative balance  ? ?AKI on CKD III ?- Cr 2.04 much better  D/c zaroxyln  would continue current diuretics until Cr goes over 2.6 seems to be plateau  ?- clinically remains volume overloaded, lasix drip d/c on 80 bid lasix with > 2 L additional diuresis  ?  ?Persistent Afib ?-heart rate controlled ?-continue Cardizem CD 120mg  daily ?-Continue Xarelto ?  ?Type 2 DM with hyperglycemia  ?Lymphedema of BLE with cellulitis  ?Obesity  ?-managed per hospital medicine  ?  ? ?For questions or updates, please contact [facility] ?Please consult [email or web address] for contact info under  ? ? ?   ?Signed, ? , MD  ?03/02/2022, 8:06 AM   ? ? ? ?

## 2022-03-02 NOTE — Progress Notes (Signed)
?PROGRESS NOTE ? ? ? ?[name]  [id number] DOB: 08-23-1951 DOA: 02/13/2022 ?PCP: Tammi Sou, MD  ?[name] morbidly obese, BMI of 56, history of type 2 diabetes mellitus, chronic diastolic CHF, paroxysmal atrial fibrillation, hypertension presented to the ED with dyspnea and progressive weight gain of 25lbs over 4 weeks. ?Admitted with significant volume overload, ?-Diuresed with IV Lasix and treated for UTI as well ?-4/18 was transferred to [facility] from [facility] for cardiac work-up, right heart cath 4/18 noted marked volume overload, predominantly right heart failure, preserved cardiac output ?-4/19 started Lasix drip and metolazone ?-Cards following, now on Lasix 80 Mg twice daily, still significantly volume overloaded ? ?Subjective: ?-Finally starting to feel a little better, thighs less tight today ? ?Assessment and Plan: ? ? Acute on chronic diastolic CHF (congestive heart failure) ([place]) ?Echo with EF 55 to 60% with moderate LVH, not well visualized RV ?-Diuresed on IV Lasix and Lasix gtt., he is 16 L negative, weight down 28 kg ?-Right heart cath 4/18 noted markedly elevated filling pressures, R>L heart failure, preserved cardiac output  ?-Still remains significantly volume overloaded, continue IV Lasix 80 Mg twice daily ?-Remains on metolazone, albumin is low, repeat IV albumin, creatinine starting to improve-GDMT limited by CKD, ?-Monitor urine output, BMP in a.m. ?-ABG surprisingly without hypercarbia ?-Extremely limited mobility, overall prognosis is poor, plan for SNF eventually once volume status has improved further ? ?Atrial fibrillation ([place]) ?-Remains in A-fib, heart rate controlled on Cardizem and Xarelto ? ?Acute kidney injury superimposed on chronic kidney disease ([place]) ?CKD stage 3b. Hyponatremia. Hypokalemia ?-Baseline creatinine around 1.6-1.8, creatinine had bumped up to 2.3 ?-Cardiorenal syndrome, now improving ? ?Hypertension, essential ?-Stable, continue Imdur ? ?DM2 (diabetes  mellitus, type 2) ([place]) ?-CBG stable now continue current dose of insulin 70/30 ? ?Lymphedema of both lower extremities ?Chronic wounds ?-Worsened in the setting of right heart failure ?-Completed 5 days of IV ceftriaxone for UTI and possible wound infection ?-Continue topical antifungal and local wound care, gets dressing changes every Wednesday ? ?UTI ?-Completed ABX therapy ? ?Morbid obesity ?Calculated BMI is 56.3.  ? ? ?DVT prophylaxis: Xarelto ?Code Status: DNR ?Family Communication: Discussed with patient in detail, no family at bedside ?Disposition Plan: SNF pending improvement in volume status ? ?Consultants:  ?Cardiology ? ?Procedures:  ? ?Antimicrobials:  ? ? ?Objective: ?Vitals:  ? 03/01/22 2214 03/01/22 2215 03/01/22 2221 03/02/22 0311  ?BP:   (!) 128/56 120/68  ?Pulse:   68 78  ?Resp: (!) 21 (!) 26 (!) 21 16  ?Temp:   98 ?F (36.7 ?C) 98.2 ?F (36.8 ?C)  ?TempSrc:   Oral Oral  ?SpO2:   92% 92%  ?Weight:    (!) 158.9 kg  ?Height:      ? ? ?Intake/Output Summary (Last 24 hours) at 03/02/2022 1058 ?Last data filed at 03/02/2022 0900 ?Gross per 24 hour  ?Intake 1039.77 ml  ?Output 4500 ml  ?Net -3460.23 ml  ? ?Filed Weights  ? 03/01/22 0542 03/02/22 0311  ?Weight: (!) 158.4 kg (!) 158.9 kg  ? ? ?Examination: ? ?General exam: Chronically ill morbidly obese male sitting up in recliner, AAOx3, no distress ?CVS: S1-S2, irregularly irregular rhythm ?Lungs: Poor air movement bilaterally ?Abdomen: Soft, obese, nontender, improving abdominal wall edema ?Extremities: 2+ edema, bilateral lower leg wounds, excoriations, Unna boots on  ?Psychiatry:  Mood & affect appropriate.  ? ? ? ?Data Reviewed:  ? ?CBC: ?Recent Labs  ?Lab 02/24/22 ?0530 02/27/22 ?0510 02/28/22 ?0630 03/02/22 ?0615  ?  WBC 8.5 8.6 8.2 8.4  ?HGB 9.4* 10.2* 10.2* 9.7*  ?HCT 30.2* 31.8* 31.5* 31.4*  ?MCV 72.4* 70.4* 70.3* 71.7*  ?PLT 284 341 372 420*  ? ?Basic Metabolic Panel: ?Recent Labs  ?Lab 02/24/22 ?0530 02/25/22 ?[id number] 02/26/22 ?[id number] 02/27/22 ?0510  02/28/22 ?0615 03/01/22 ?[id number] 03/02/22 ?0615  ?NA 134* 134* 131* 132* 134* 133* 135  ?K 3.8 4.0 3.7 3.8 3.7 3.5 3.4*  ?CL 92* 91* 86* 88* 90* 90* 92*  ?CO2 31 33* 33* 33* 33* 31 32  ?GLUCOSE 193* 187* 227* 215* 194* 175* 151*  ?BUN 79* 86* 91* 92* 97* 99* 91*  ?CREATININE 1.74* 1.92* 1.91* 2.22* 2.28* 2.36* 2.04*  ?CALCIUM 8.6* 8.8* 8.7* 8.8* 8.7* 9.0 8.9  ?MG 2.1 2.2  --  2.2  --   --   --   ? ?GFR: ?Estimated Creatinine Clearance: 51.2 mL/min (A) (by C-G formula based on SCr of 2.04 mg/dL (H)). ?Liver Function Tests: ?Recent Labs  ?Lab 02/28/22 ?0615  ?AST 25  ?ALT 26  ?ALKPHOS 96  ?BILITOT 0.7  ?PROT 7.1  ?ALBUMIN 2.8*  ? ?No results for input(s): LIPASE, AMYLASE in the last 168 hours. ?No results for input(s): AMMONIA in the last 168 hours. ?Coagulation Profile: ?No results for input(s): INR, PROTIME in the last 168 hours. ?Cardiac Enzymes: ?No results for input(s): CKTOTAL, CKMB, CKMBINDEX, TROPONINI in the last 168 hours. ?BNP (last 3 results) ?No results for input(s): PROBNP in the last 8760 hours. ?HbA1C: ?No results for input(s): HGBA1C in the last 72 hours. ?CBG: ?Recent Labs  ?Lab 03/01/22 ?0622 03/01/22 ?1206 03/01/22 ?1622 03/01/22 ?2108 03/02/22 ?0602  ?GLUCAP 169* 119* 159* 159* 141*  ? ?Lipid Profile: ?No results for input(s): CHOL, HDL, LDLCALC, TRIG, CHOLHDL, LDLDIRECT in the last 72 hours. ?Thyroid Function Tests: ?No results for input(s): TSH, T4TOTAL, FREET4, T3FREE, THYROIDAB in the last 72 hours. ?Anemia Panel: ?No results for input(s): VITAMINB12, FOLATE, FERRITIN, TIBC, IRON, RETICCTPCT in the last 72 hours. ?Urine analysis: ?   ?Component Value Date/Time  ? COLORURINE YELLOW 02/25/2022 1636  ? APPEARANCEUR HAZY (A) 02/25/2022 1636  ? LABSPEC 1.010 02/25/2022 1636  ? PHURINE 5.0 02/25/2022 1636  ? GLUCOSEU NEGATIVE 02/25/2022 1636  ? GLUCOSEU NEGATIVE 10/18/2020 1326  ? HGBUR LARGE (A) 02/25/2022 1636  ? [place] NEGATIVE 02/25/2022 1636  ? Benjamin Stain NEGATIVE 02/25/2022 1636  ? PROTEINUR  NEGATIVE 02/25/2022 1636  ? UROBILINOGEN 1.0 10/18/2020 1326  ? NITRITE NEGATIVE 02/25/2022 1636  ? LEUKOCYTESUR MODERATE (A) 02/25/2022 1636  ? ?Sepsis Labs: ?@LABRCNTIP (procalcitonin:4,lacticidven:4) ? ?)No results found for this or any previous visit (from the past 240 hour(s)).  ? ?Radiology Studies: ?No results found. ? ? ?Scheduled Meds: ? acetaZOLAMIDE  250 mg Oral Daily  ? Chlorhexidine Gluconate Cloth  6 each Topical Daily  ? diltiazem  120 mg Oral Daily  ? furosemide  80 mg Intravenous BID  ? insulin aspart  0-15 Units Subcutaneous TID WC  ? insulin aspart  0-5 Units Subcutaneous QHS  ? insulin aspart protamine- aspart  30 Units Subcutaneous BID WC  ? isosorbide mononitrate  15 mg Oral Daily  ? loratadine  10 mg Oral Daily  ? multivitamin with minerals  1 tablet Oral Daily  ? nystatin cream   Topical BID  ? potassium chloride  40 mEq Oral BID  ? rivaroxaban  15 mg Oral Daily  ? terazosin  10 mg Oral QHS  ? ?Continuous Infusions: ? ? ? ? LOS: 17 days  ? ? ?Time spent:  10min ? ?Domenic Polite, MD ?[facility] ? ? ?03/02/2022, 10:58 AM  ?  ?

## 2022-03-02 NOTE — Plan of Care (Signed)
?  Problem: Education: ?Goal: Knowledge of General Education information will improve ?Description: Including pain rating scale, medication(s)/side effects and non-pharmacologic comfort measures ?03/02/2022 0841 by Marcille Blanco, RN ?Outcome: Progressing ?03/02/2022 0841 by Marcille Blanco, RN ?Outcome: Progressing ?  ?Problem: Health Behavior/Discharge Planning: ?Goal: Ability to manage health-related needs will improve ?Outcome: Progressing ?  ?Problem: Clinical Measurements: ?Goal: Ability to maintain clinical measurements within normal limits will improve ?Outcome: Progressing ?  ?Problem: Clinical Measurements: ?Goal: Will remain free from infection ?Outcome: Progressing ?  ?Problem: Clinical Measurements: ?Goal: Diagnostic test results will improve ?Outcome: Progressing ?  ?

## 2022-03-03 DIAGNOSIS — I5033 Acute on chronic diastolic (congestive) heart failure: Secondary | ICD-10-CM | POA: Diagnosis not present

## 2022-03-03 LAB — GLUCOSE, CAPILLARY
Glucose-Capillary: 136 mg/dL — ABNORMAL HIGH (ref 70–99)
Glucose-Capillary: 171 mg/dL — ABNORMAL HIGH (ref 70–99)
Glucose-Capillary: 180 mg/dL — ABNORMAL HIGH (ref 70–99)
Glucose-Capillary: 215 mg/dL — ABNORMAL HIGH (ref 70–99)

## 2022-03-03 LAB — BASIC METABOLIC PANEL
Anion gap: 9 (ref 5–15)
BUN: 78 mg/dL — ABNORMAL HIGH (ref 8–23)
CO2: 31 mmol/L (ref 22–32)
Calcium: 8.4 mg/dL — ABNORMAL LOW (ref 8.9–10.3)
Chloride: 94 mmol/L — ABNORMAL LOW (ref 98–111)
Creatinine, Ser: 1.93 mg/dL — ABNORMAL HIGH (ref 0.61–1.24)
GFR, Estimated: 37 mL/min — ABNORMAL LOW (ref >60)
Glucose, Bld: 139 mg/dL — ABNORMAL HIGH (ref 70–99)
Potassium: 3.7 mmol/L (ref 3.5–5.1)
Sodium: 134 mmol/L — ABNORMAL LOW (ref 135–145)

## 2022-03-03 LAB — COOXEMETRY PANEL
Carboxyhemoglobin: 1.7 % — ABNORMAL HIGH (ref 0.5–1.5)
Methemoglobin: 0.7 % (ref 0.0–1.5)
O2 Saturation: 55.8 %
Total hemoglobin: 10.7 g/dL — ABNORMAL LOW (ref 12.0–16.0)

## 2022-03-03 NOTE — Progress Notes (Signed)
?PROGRESS NOTE ? ? ? ?[name]  [id number] DOB: 1951/09/16 DOA: 02/13/2022 ?PCP: Tammi Sou, MD  ?[name] morbidly obese, BMI of 56, history of type 2 diabetes mellitus, chronic diastolic CHF, paroxysmal atrial fibrillation, hypertension presented to the ED with dyspnea and progressive weight gain of 25lbs over 4 weeks. ?Admitted with significant volume overload, ?-Diuresed with IV Lasix and treated for UTI as well ?-4/18 was transferred to [facility] from [facility] for cardiac work-up, right heart cath 4/18 noted marked volume overload, predominantly right heart failure, preserved cardiac output ?-4/19 started Lasix drip and metolazone ?-Cards following, now on Lasix 80 Mg twice daily, still significantly volume overloaded ? ?Subjective: ?-Feels better, weight going down, thighs and abdomen feels less tight ? ?Assessment and Plan: ? ? Acute on chronic diastolic CHF (congestive heart failure) ([place]) ?Echo with EF 55 to 60% with moderate LVH, not well visualized RV ?-Diuresed on IV Lasix and Lasix gtt., he is 19.2 L negative, weight down 31 kg, (65 pounds) ?-Right heart cath 4/18 noted markedly elevated filling pressures, R>L heart failure, preserved cardiac output  ?-Still remains significantly volume overloaded, continue IV Lasix 80 Mg twice daily ?-Remains on metolazone, albumin is low, repeat IV albumin, creatinine starting to improve-GDMT limited by CKD, ?-Monitor urine output, BMP in a.m. ?-ABG surprisingly without hypercarbia ?-Extremely limited mobility, overall prognosis appears poor, plan for SNF eventually once volume status has improved further ? ?Atrial fibrillation ([place]) ?-Remains in A-fib, heart rate controlled on Cardizem and Xarelto ? ?Acute kidney injury superimposed on chronic kidney disease ([place]) ?CKD stage 3b. Hyponatremia. Hypokalemia ?-Baseline creatinine around 1.6-1.8, creatinine had bumped up to 2.3 ?-Cardiorenal syndrome, now improving ? ?Hypertension, essential ?-Stable, continue  Imdur ? ?DM2 (diabetes mellitus, type 2) ([place]) ?-CBG stable now continue current dose of insulin 70/30 ? ?Lymphedema of both lower extremities ?Chronic wounds ?-Worsened in the setting of right heart failure ?-Completed 5 days of IV ceftriaxone for UTI and possible wound infection ?-Continue topical antifungal and local wound care, gets dressing changes every Wednesday ? ?UTI ?-Completed ABX therapy ? ?Morbid obesity ?Calculated BMI is 56.3.  ? ?DVT prophylaxis: Xarelto ?Code Status: DNR ?Family Communication: Discussed with patient in detail, no family at bedside ?Disposition Plan: SNF pending improvement in volume status ? ?Consultants:  ?Cardiology ? ?Procedures:  ? ?Antimicrobials:  ? ? ?Objective: ?Vitals:  ? 03/02/22 0311 03/02/22 1930 03/03/22 0355 03/03/22 1041  ?BP: 120/68 (!) 121/58 132/69 131/64  ?Pulse: 78 84 62 71  ?Resp: 16 18 18 19   ?Temp: 98.2 ?F (36.8 ?C) 98.2 ?F (36.8 ?C) 98.2 ?F (36.8 ?C) 98.2 ?F (36.8 ?C)  ?TempSrc: Oral Oral Oral Oral  ?SpO2: 92% 92% 93% 93%  ?Weight: (!) 158.9 kg  (!) 155.7 kg   ?Height:      ? ? ?Intake/Output Summary (Last 24 hours) at 03/03/2022 1103 ?Last data filed at 03/03/2022 1041 ?Gross per 24 hour  ?Intake 800 ml  ?Output 4100 ml  ?Net -3300 ml  ? ?Filed Weights  ? 03/01/22 0542 03/02/22 0311 03/03/22 0355  ?Weight: (!) 158.4 kg (!) 158.9 kg (!) 155.7 kg  ? ? ?Examination: ? ?General exam: Chronically ill morbidly obese male sitting up in the recliner, AAOx3, no distress ?CVS: S1-S2, irregularly irregular rhythm ?Lungs: Poor air movement bilaterally ?Abdomen: Soft, obese, nontender, improving abdominal wall edema  ?Extremities: 2+ edema, bilateral lower leg wounds, excoriations, Unna boots on  ?Psychiatry:  Mood & affect appropriate.  ? ? ? ?Data Reviewed:  ? ?CBC: ?  Recent Labs  ?Lab 02/27/22 ?0510 02/28/22 ?0630 03/02/22 ?0615  ?WBC 8.6 8.2 8.4  ?HGB 10.2* 10.2* 9.7*  ?HCT 31.8* 31.5* 31.4*  ?MCV 70.4* 70.3* 71.7*  ?PLT 341 372 420*  ? ?Basic Metabolic Panel: ?Recent  Labs  ?Lab 02/25/22 ?[id number] 02/26/22 ?[id number] 02/27/22 ?0510 02/28/22 ?0615 03/01/22 ?[id number] 03/02/22 ?0615 03/03/22 ?0600  ?NA 134*   < > 132* 134* 133* 135 134*  ?K 4.0   < > 3.8 3.7 3.5 3.4* 3.7  ?CL 91*   < > 88* 90* 90* 92* 94*  ?CO2 33*   < > 33* 33* 31 32 31  ?GLUCOSE 187*   < > 215* 194* 175* 151* 139*  ?BUN 86*   < > 92* 97* 99* 91* 78*  ?CREATININE 1.92*   < > 2.22* 2.28* 2.36* 2.04* 1.93*  ?CALCIUM 8.8*   < > 8.8* 8.7* 9.0 8.9 8.4*  ?MG 2.2  --  2.2  --   --   --   --   ? < > = values in this interval not displayed.  ? ?GFR: ?Estimated Creatinine Clearance: 53.4 mL/min (A) (by C-G formula based on SCr of 1.93 mg/dL (H)). ?Liver Function Tests: ?Recent Labs  ?Lab 02/28/22 ?0615  ?AST 25  ?ALT 26  ?ALKPHOS 96  ?BILITOT 0.7  ?PROT 7.1  ?ALBUMIN 2.8*  ? ?No results for input(s): LIPASE, AMYLASE in the last 168 hours. ?No results for input(s): AMMONIA in the last 168 hours. ?Coagulation Profile: ?No results for input(s): INR, PROTIME in the last 168 hours. ?Cardiac Enzymes: ?No results for input(s): CKTOTAL, CKMB, CKMBINDEX, TROPONINI in the last 168 hours. ?BNP (last 3 results) ?No results for input(s): PROBNP in the last 8760 hours. ?HbA1C: ?No results for input(s): HGBA1C in the last 72 hours. ?CBG: ?Recent Labs  ?Lab 03/02/22 ?0602 03/02/22 ?1215 03/02/22 ?1629 03/02/22 ?2036 03/03/22 ?0552  ?GLUCAP 141* 189* 179* 148* 136*  ? ?Lipid Profile: ?No results for input(s): CHOL, HDL, LDLCALC, TRIG, CHOLHDL, LDLDIRECT in the last 72 hours. ?Thyroid Function Tests: ?No results for input(s): TSH, T4TOTAL, FREET4, T3FREE, THYROIDAB in the last 72 hours. ?Anemia Panel: ?No results for input(s): VITAMINB12, FOLATE, FERRITIN, TIBC, IRON, RETICCTPCT in the last 72 hours. ?Urine analysis: ?   ?Component Value Date/Time  ? COLORURINE YELLOW 02/25/2022 1636  ? APPEARANCEUR HAZY (A) 02/25/2022 1636  ? LABSPEC 1.010 02/25/2022 1636  ? PHURINE 5.0 02/25/2022 1636  ? GLUCOSEU NEGATIVE 02/25/2022 1636  ? GLUCOSEU NEGATIVE 10/18/2020  1326  ? HGBUR LARGE (A) 02/25/2022 1636  ? [place] NEGATIVE 02/25/2022 1636  ? Benjamin Stain NEGATIVE 02/25/2022 1636  ? PROTEINUR NEGATIVE 02/25/2022 1636  ? UROBILINOGEN 1.0 10/18/2020 1326  ? NITRITE NEGATIVE 02/25/2022 1636  ? LEUKOCYTESUR MODERATE (A) 02/25/2022 1636  ? ?Sepsis Labs: ?@LABRCNTIP (procalcitonin:4,lacticidven:4) ? ?)No results found for this or any previous visit (from the past 240 hour(s)).  ? ?Radiology Studies: ?No results found. ? ? ?Scheduled Meds: ? acetaZOLAMIDE  250 mg Oral Daily  ? Chlorhexidine Gluconate Cloth  6 each Topical Daily  ? diltiazem  120 mg Oral Daily  ? furosemide  80 mg Intravenous BID  ? insulin aspart  0-15 Units Subcutaneous TID WC  ? insulin aspart  0-5 Units Subcutaneous QHS  ? insulin aspart protamine- aspart  30 Units Subcutaneous BID WC  ? isosorbide mononitrate  15 mg Oral Daily  ? loratadine  10 mg Oral Daily  ? multivitamin with minerals  1 tablet Oral Daily  ? nystatin cream  Topical BID  ? rivaroxaban  20 mg Oral Daily  ? terazosin  10 mg Oral QHS  ? ?Continuous Infusions: ? ? ? ? LOS: 18 days  ? ? ?Time spent: 51min ? ?Domenic Polite, MD ?[facility] ? ? ?03/03/2022, 11:03 AM  ?  ?

## 2022-03-03 NOTE — Plan of Care (Signed)
  Problem: Education: Goal: Knowledge of General Education information will improve Description Including pain rating scale, medication(s)/side effects and non-pharmacologic comfort measures Outcome: Progressing   Problem: Clinical Measurements: Goal: Ability to maintain clinical measurements within normal limits will improve Outcome: Progressing   Problem: Activity: Goal: Risk for activity intolerance will decrease Outcome: Progressing   

## 2022-03-03 NOTE — TOC Progression Note (Signed)
Transition of Care (TOC) - Progression Note  ? ? ?Patient Details  ?Name: [place] Ndiaye Bolin ?MRN: [id number] ?Date of Birth: 1951/03/03 ? ?Transition of Care (TOC) CM/SW Contact  ?[name], LCSW ?Phone Number: [phone] ?03/03/2022, 8:11 AM ? ?Clinical Narrative:    ?Pt's authorization has been approved. ?[employer] #- [id number] ?Health Plan #- [id number] ?Authorization dates: 4/23-03/05/2022 ?Fax Number: [phone]  ? ?TOC team will continue to assist with discharge planning needs.   ? ? ?Expected Discharge Plan: Skilled Nursing Facility ?Barriers to Discharge: Continued Medical Work up ? ?Expected Discharge Plan and Services ?Expected Discharge Plan: Skilled Nursing Facility ?In-house Referral: NA ?Discharge Planning Services: CM Consult ?  ?Living arrangements for the past 2 months: Hotel/Motel ?                ?  ?DME Agency: NA ?  ?  ?  ?  ?  ?  ?  ?  ? ? ?Social Determinants of Health (SDOH) Interventions ?  ? ?Readmission Risk Interventions ? ?  02/15/2022  ?  2:32 PM  ?Readmission Risk Prevention Plan  ?Transportation Screening Complete  ?PCP or Specialist Appt within 5-7 Days Complete  ? ? ?

## 2022-03-03 NOTE — Progress Notes (Signed)
? ?Progress Note ? ?Patient Name: [name] ?Date of Encounter: 03/03/2022 ? ?[facility] Cardiologist: [name], MD  ? ?Subjective  ? ?Will need rehab stay continues grossly volume overloaded and barely able to support standing   ? ?Inpatient Medications  ?  ?Scheduled Meds: ? acetaZOLAMIDE  250 mg Oral Daily  ? Chlorhexidine Gluconate Cloth  6 each Topical Daily  ? diltiazem  120 mg Oral Daily  ? furosemide  80 mg Intravenous BID  ? insulin aspart  0-15 Units Subcutaneous TID WC  ? insulin aspart  0-5 Units Subcutaneous QHS  ? insulin aspart protamine- aspart  30 Units Subcutaneous BID WC  ? isosorbide mononitrate  15 mg Oral Daily  ? loratadine  10 mg Oral Daily  ? multivitamin with minerals  1 tablet Oral Daily  ? nystatin cream   Topical BID  ? rivaroxaban  20 mg Oral Daily  ? terazosin  10 mg Oral QHS  ? ?Continuous Infusions: ? ? ? ?PRN Meds: ?acetaminophen **OR** [DISCONTINUED] acetaminophen, albuterol, diclofenac Sodium, guaiFENesin, HYDROcodone-acetaminophen, metoprolol tartrate, ondansetron **OR** ondansetron (ZOFRAN) IV, ondansetron (ZOFRAN) IV, senna-docusate, sodium chloride flush  ? ?Vital Signs  ?  ?Vitals:  ? 03/01/22 2221 03/02/22 0311 03/02/22 1930 03/03/22 0355  ?BP: (!) 128/56 120/68 (!) 121/58 132/69  ?Pulse: 68 78 84 62  ?Resp: (!) 21 16 18 18   ?Temp: 98 ?F (36.7 ?C) 98.2 ?F (36.8 ?C) 98.2 ?F (36.8 ?C) 98.2 ?F (36.8 ?C)  ?TempSrc: Oral Oral Oral Oral  ?SpO2: 92% 92% 92% 93%  ?Weight:  (!) 158.9 kg  (!) 155.7 kg  ?Height:      ? ? ?Intake/Output Summary (Last 24 hours) at 03/03/2022 0919 ?Last data filed at 03/03/2022 0356 ?Gross per 24 hour  ?Intake 560 ml  ?Output 3050 ml  ?Net -2490 ml  ? ? ?  03/03/2022  ?  3:55 AM 03/02/2022  ?  3:11 AM 03/01/2022  ?  5:42 AM  ?Last 3 Weights  ?Weight (lbs) 343 lb 4.1 oz 350 lb 5 oz 349 lb 3.2 oz  ?Weight (kg) 155.7 kg 158.9 kg 158.396 kg  ?   ? ?Telemetry  ?  ?A fib with controlled VR - Personally Reviewed ? ?ECG  ?  ?No new tracing  -  Personally Reviewed ? ?Physical Exam  ? ?GEN: No acute distress. Morbidly obese ?Neck: No JVD ?Cardiac: Irregularly irregular, no murmurs, rubs, or gallops.  ?Respiratory: Clear but diminished to auscultation bilaterally ?GI: Soft, nontender, non-distended  ?MS: Marked edema of BLE up to scrotum , ACE wrap in place, foul no weeping needs podiatry consult  ?Neuro:  Nonfocal  ?Psych: Normal affect  ? ?Labs  ?  ?High Sensitivity Troponin:   ?Recent Labs  ?Lab 02/13/22 ?1600 02/13/22 ?1800  ?TROPONINIHS 9 9  ?   ?Chemistry ?Recent Labs  ?Lab 02/25/22 ?02/27/22 02/26/22 ?02/28/22 02/27/22 ?0510 02/28/22 ?0615 03/01/22 ?03/03/22 03/02/22 ?0615 03/03/22 ?0600  ?NA 134*   < > 132* 134* 133* 135 134*  ?K 4.0   < > 3.8 3.7 3.5 3.4* 3.7  ?CL 91*   < > 88* 90* 90* 92* 94*  ?CO2 33*   < > 33* 33* 31 32 31  ?GLUCOSE 187*   < > 215* 194* 175* 151* 139*  ?BUN 86*   < > 92* 97* 99* 91* 78*  ?CREATININE 1.92*   < > 2.22* 2.28* 2.36* 2.04* 1.93*  ?CALCIUM 8.8*   < > 8.8* 8.7* 9.0 8.9 8.4*  ?  MG 2.2  --  2.2  --   --   --   --   ?PROT  --   --   --  7.1  --   --   --   ?ALBUMIN  --   --   --  2.8*  --   --   --   ?AST  --   --   --  25  --   --   --   ?ALT  --   --   --  26  --   --   --   ?ALKPHOS  --   --   --  14  --   --   --   ?BILITOT  --   --   --  0.7  --   --   --   ?GFRNONAA 37*   < > 31* 30* 29* 34* 37*  ?ANIONGAP 10   < > 11 11 12 11 9   ? < > = values in this interval not displayed.  ?  ?Lipids No results for input(s): CHOL, TRIG, HDL, LABVLDL, LDLCALC, CHOLHDL in the last 168 hours.  ?Hematology ?Recent Labs  ?Lab 02/27/22 ?0510 02/28/22 ?0630 03/02/22 ?0615  ?WBC 8.6 8.2 8.4  ?RBC 4.52 4.48 4.38  ?HGB 10.2* 10.2* 9.7*  ?HCT 31.8* 31.5* 31.4*  ?MCV 70.4* 70.3* 71.7*  ?MCH 22.6* 22.8* 22.1*  ?MCHC 32.1 32.4 30.9  ?RDW 18.6* 18.8* 19.3*  ?PLT 341 372 420*  ? ?Thyroid No results for input(s): TSH, FREET4 in the last 168 hours.  ?BNPNo results for input(s): BNP, PROBNP in the last 168 hours.  ?DDimer No results for input(s): DDIMER in the  last 168 hours.  ? ?Radiology  ?  ?No results found. ? ?Cardiac Studies  ? ?Right heart cath 02/19/22: ? ?Findings: ?  ?RA = 18 ?RV = 60/24 ?PA =  63/19 (35) ?PCW = 22  ?Fick cardiac output/index = 10.5/3.8 ?Thermo CO/CI = 7.2/2.6 ?PVR = 1.9 WU (Thermo) ?FA sat = 92% ?PA sat = 63%, 65% ?High SVC = 68% ?PAPi = 2.4 ?  ?Assessment: ?1. Marked volume overload with R>>L heart failure ?2. Preserved cardiac output ?  ?Plan/Discussion: ?  ?Remains markedly volume overloaded. Consider use of lasix gtt +/- acetazolamide or metolazone to facilitate diuresis. Keep LEs wrapped.  ?  ? ?Echo from 02/14/22: ? ? 1. Left ventricular ejection fraction, by estimation, is 55 to 60%. The  ?left ventricle has normal function. The left ventricle has no regional  ?wall motion abnormalities. There is moderate left ventricular hypertrophy.  ?Left ventricular diastolic function could not be evaluated.  ? 2. Right ventricular systolic function was not well visualized. The right  ?ventricular size is not well visualized.  ? 3. Left atrial size was mildly dilated.  ? 4. The mitral valve is normal in structure. No evidence of mitral valve  ?regurgitation.  ? 5. The aortic valve is normal in structure. There is mild calcification  ?of the aortic valve. Aortic valve regurgitation is trivial.  ? ? ?Patient Profile  ?   ?71 yo male with the PMH of atrial fibrillation, hypertension, combined heart failure, T2DM , CKD III, OSA, RLS, obesity, cardiology is following since 02/14/22 for CHF.  ? ?Assessment & Plan  ?  ?  ?Acute hypoxic respiratory failure, resolved  ?Acute on chronic diastolic heart failure ?Right heart failure ?- Echo from 02/14/22 showed EF 55 to 60%, unable assess diastolic function and RV ?- CXR  with pulmonary vascular congestion, hazy bilateral opacities that may represent mild edema ?- BNP 294, but in the setting of morbid obesity ?- Right heart cath 02/19/22 with RA 18, RV 60/24, PA 63/19, PCW 22, Fick cardiac output/index = 10.5/3.8;  marked right heart failure  ?- COOX better 57%  ?- Good diuresis now on 80 iv bid lasix Bicarb stable 31 ?- continues net negative balance  ? ?AKI on CKD III ?- Cr 1.93 much better  D/c zaroxyln  would continue current diuretics until Cr goes over 2.5 seems to be plateau  ?- clinically remains volume overloaded, lasix drip d/c on 80 bid lasix with  2.5  L additional diuresis  ?  ?Persistent Afib ?-heart rate controlled ?-continue Cardizem CD 120mg  daily ?-Continue Xarelto ?  ?Type 2 DM with hyperglycemia  ?Lymphedema of BLE with cellulitis  ?Obesity  ?-managed per hospital medicine  ? ?Will need rehab stay PT/OT and podiatry consult  ?  ? ?For questions or updates, please contact [facility] ?Please consult [email or web address] for contact info under  ? ? ?   ?Signed, ?Charlton HawsPeter Magdaline Zollars, MD  ?03/03/2022, 9:19 AM   ? ? ? ?

## 2022-03-04 DIAGNOSIS — I5033 Acute on chronic diastolic (congestive) heart failure: Secondary | ICD-10-CM | POA: Diagnosis not present

## 2022-03-04 LAB — GLUCOSE, CAPILLARY
Glucose-Capillary: 173 mg/dL — ABNORMAL HIGH (ref 70–99)
Glucose-Capillary: 192 mg/dL — ABNORMAL HIGH (ref 70–99)
Glucose-Capillary: 183 mg/dL — ABNORMAL HIGH (ref 70–99)
Glucose-Capillary: 171 mg/dL — ABNORMAL HIGH (ref 70–99)

## 2022-03-04 LAB — COOXEMETRY PANEL
Carboxyhemoglobin: 1.8 % — ABNORMAL HIGH (ref 0.5–1.5)
Methemoglobin: <0.7 % (ref 0.0–1.5)
O2 Saturation: 68 %
Total hemoglobin: 10.6 g/dL — ABNORMAL LOW (ref 12.0–16.0)

## 2022-03-04 LAB — BASIC METABOLIC PANEL
Anion gap: 9 (ref 5–15)
BUN: 76 mg/dL — ABNORMAL HIGH (ref 8–23)
CO2: 31 mmol/L (ref 22–32)
Calcium: 8.4 mg/dL — ABNORMAL LOW (ref 8.9–10.3)
Chloride: 94 mmol/L — ABNORMAL LOW (ref 98–111)
Creatinine, Ser: 2.1 mg/dL — ABNORMAL HIGH (ref 0.61–1.24)
GFR, Estimated: 33 mL/min — ABNORMAL LOW (ref >60)
Glucose, Bld: 157 mg/dL — ABNORMAL HIGH (ref 70–99)
Potassium: 3.5 mmol/L (ref 3.5–5.1)
Sodium: 134 mmol/L — ABNORMAL LOW (ref 135–145)

## 2022-03-04 MED ORDER — TORSEMIDE 20 MG PO TABS
40.0000 mg | ORAL_TABLET | Freq: Every day | ORAL | Status: DC
  Administered 2022-03-04 – 2022-03-07 (×4): 40 mg via ORAL
  Filled 2022-03-04 (×4): qty 2

## 2022-03-04 MED ORDER — POTASSIUM CHLORIDE CRYS ER 20 MEQ PO TBCR
40.0000 meq | EXTENDED_RELEASE_TABLET | Freq: Once | ORAL | Status: AC
  Administered 2022-03-04: 40 meq via ORAL
  Filled 2022-03-04: qty 2

## 2022-03-04 NOTE — Progress Notes (Signed)
Physical Therapy Treatment ?Patient Details ?Name: [name] ?MRN: [id number] ?DOB: November 11, 1950 ?Today's Date: 03/04/2022 ? ? ?History of Present Illness 71 y.o. male presented to the ER on 02/13/2022 with weight gain and hypoxia. Pt with acute on chronic CHF and UTI. Transferred to [facility] on 4/17 and underwent heart cath on 4/18. PMH significant for A-fib, HFpEF, HTN, DMII, morbid obesity, venous stasis, lymphedema. ? ?  ?PT Comments  ? ? Pt received in recliner, agreeable to therapy session with emphasis on seated LE exercises and transfer training. Pt limited due to severe reported knee pain (R>L) and with significant anxiety regarding proper technique with transfers. Pt given multimodal cues and able to scoot with min to modA in chair anterior/posterior and performed partial stand x5 trials but unable to achieve full upright posture today at rollator despite +2 maxA provided with multiple attempts. RN notified pt SpO2 91-93% mostly on RA (desat briefly to 88% on RA with exertion) and may need supplemental O2 when he stands to get back to bed later in the day. Pt continues to benefit from PT services to progress toward functional mobility goals.   ?Recommendations for follow up therapy are one component of a multi-disciplinary discharge planning process, led by the attending physician.  Recommendations may be updated based on patient status, additional functional criteria and insurance authorization. ? ?Follow Up Recommendations ? Skilled nursing-short term rehab (<3 hours/day) ?  ?  ?Assistance Recommended at Discharge Frequent or constant Supervision/Assistance  ?Patient can return home with the following Two people to help with walking and/or transfers;Two people to help with bathing/dressing/bathroom;Assist for transportation;Help with stairs or ramp for entrance;Assistance with cooking/housework ?  ?Equipment Recommendations ? None recommended by PT  ?  ?Recommendations for Other Services   ? ? ?   ?Precautions / Restrictions Precautions ?Precautions: Fall ?Precaution Comments: significant wounds BLE with weeping, scrotal swelling ?Restrictions ?Weight Bearing Restrictions: No  ?  ? ?Mobility ? Bed Mobility ?  ?  ?  ?  ?  ?  ?  ?General bed mobility comments: pt received in recliner ?  ? ?Transfers ?Overall transfer level: Needs assistance ?Equipment used: Rollator (4 wheels) ?Transfers: Sit to/from Stand ?Sit to Stand: +2 physical assistance, Max assist ?  ?  ?  ?  ?  ?General transfer comment: Max A for power up and weight shift forward, pt able to elevated hips 4-5" from chair surface but unable to raise arms to rollator to extend hips due to R knee pain/anxiety; x5 attempts ?  ? ? ?  ?Balance Overall balance assessment: Needs assistance ?Sitting-balance support: Feet supported ?Sitting balance-Leahy Scale: Fair ?Sitting balance - Comments: Needs assist to pull forward to sitting on edge of recliner, then maintains without assist ?  ?  ?  ?Standing balance comment: pt unable to stand fully upright; needs BUE support for squat in chair ?  ?  ?   ?  ? ?  ?Cognition Arousal/Alertness: Awake/alert ?Behavior During Therapy: [facility] for tasks assessed/performed ?Overall Cognitive Status: Within Functional Limits for tasks assessed ?  ?   ?General Comments: Pt pleasant and agreeable. Requiring increased time for processing due to anxiety. ?  ?  ? ?  ?Exercises Other Exercises ?Other Exercises: seated BLE AAROM: LAQ and hamstring curls x15 reps ea (3 sets of 5 with rest breaks) pt guarding with ROM due to B knee pain ?Other Exercises: seated BLE AROM: hip flexion x10 reps ea ?Other Exercises: chair push-up x5 reps while scooting anterior/posterior ? ?  ?  General Comments General comments (skin integrity, edema, etc.): SpO2 88-93%, mostly 91-93% on RA with exertion. Briefly desat to 88%. HR WFL 70's-80's bpm. ?  ?  ? ?Pertinent Vitals/Pain Pain Assessment ?Pain Assessment: Faces ?Faces Pain Scale: Hurts whole lot ?Pain  Location: bilateral knees (R>L) with wt bearing and peri area "raw" from skin breakdown r/t moisture ?Pain Descriptors / Indicators: Grimacing, Guarding, Sharp ?Pain Intervention(s): Limited activity within patient's tolerance, Monitored during session, Repositioned  ? ? ? ?PT Goals (current goals can now be found in the care plan section) Acute Rehab PT Goals ?Patient Stated Goal: get rollator brakes fixed and get rid of fluid ?PT Goal Formulation: With patient ?Time For Goal Achievement: 03/06/22 ?Progress towards PT goals: Progressing toward goals ? ?  ?Frequency ? ? ? Min 3X/week ? ? ? ?  ?PT Plan Current plan remains appropriate  ? ? ?   ?AM-PAC PT "6 Clicks" Mobility   ?Outcome Measure ? Help needed turning from your back to your side while in a flat bed without using bedrails?: A Lot ?Help needed moving from lying on your back to sitting on the side of a flat bed without using bedrails?: Total ?Help needed moving to and from a bed to a chair (including a wheelchair)?: Total ?Help needed standing up from a chair using your arms (e.g., wheelchair or bedside chair)?: Total ?Help needed to walk in hospital room?: Total ?Help needed climbing 3-5 steps with a railing? : Total ?6 Click Score: 7 ? ?  ?End of Session Equipment Utilized During Treatment: Gait belt ?Activity Tolerance: Patient tolerated treatment well;Patient limited by pain ?Patient left: with call bell/phone within reach;in chair ?Nurse Communication: Mobility status;Other (comment) (pt SpO2 on low end of acceptable range, may need supplemental O2 with exertion when he gets up later in day) ?PT Visit Diagnosis: Unsteadiness on feet (R26.81);Muscle weakness (generalized) (M62.81);Difficulty in walking, not elsewhere classified (R26.2) ?  ? ? ?Time: [id number] ?PT Time Calculation (min) (ACUTE ONLY): 25 min ? ?Charges:  $Therapeutic Exercise: 8-22 mins ?$Therapeutic Activity: 8-22 mins          ?          ? ?Leavy Heatherly P., PTA ?Acute Rehabilitation  Services ?Secure Chat Preferred 9a-5:30pm ?Office: [phone]  ? ? ?Kara Pacer Raffi Milstein ?03/04/2022, 2:59 PM ? ?

## 2022-03-04 NOTE — Progress Notes (Addendum)
?Cardiology Progress Note  ?Patient ID: [name] ?MRN: [id number] ?DOB: [birth year] ?Date of Encounter: 03/04/2022 ? ?Primary Cardiologist: Pixie Casino, MD ? ?Subjective  ? ?Chief Complaint: SOB ? ?HPI: Net -2.6 L overnight.  Net -22.8 L since admission.  Weight is down 33.2 kg.  Appears clinically euvolemic to me. ? ?ROS:  ?All other ROS reviewed and negative. Pertinent positives noted in the HPI.    ? ?Inpatient Medications  ?Scheduled Meds: ? acetaZOLAMIDE  250 mg Oral Daily  ? Chlorhexidine Gluconate Cloth  6 each Topical Daily  ? diltiazem  120 mg Oral Daily  ? insulin aspart  0-15 Units Subcutaneous TID WC  ? insulin aspart  0-5 Units Subcutaneous QHS  ? insulin aspart protamine- aspart  30 Units Subcutaneous BID WC  ? isosorbide mononitrate  15 mg Oral Daily  ? loratadine  10 mg Oral Daily  ? multivitamin with minerals  1 tablet Oral Daily  ? nystatin cream   Topical BID  ? rivaroxaban  20 mg Oral Daily  ? terazosin  10 mg Oral QHS  ? torsemide  40 mg Oral Daily  ? ?Continuous Infusions: ? ?PRN Meds: ?acetaminophen **OR** [DISCONTINUED] acetaminophen, albuterol, diclofenac Sodium, guaiFENesin, HYDROcodone-acetaminophen, metoprolol tartrate, ondansetron **OR** ondansetron (ZOFRAN) IV, ondansetron (ZOFRAN) IV, senna-docusate, sodium chloride flush  ? ?Vital Signs  ? ?Vitals:  ? 03/03/22 1943 03/03/22 2131 03/03/22 2213 03/04/22 0329  ?BP: (!) 131/40   136/69  ?Pulse: 84   67  ?Resp: 20 18 (!) 23 18  ?Temp: 98 ?F (36.7 ?C)   98.7 ?F (37.1 ?C)  ?TempSrc: Oral   Oral  ?SpO2: 94% 95%  90%  ?Weight:    (!) 153.3 kg  ?Height:      ? ? ?Intake/Output Summary (Last 24 hours) at 03/04/2022 0751 ?Last data filed at 03/04/2022 [id number] ?Gross per 24 hour  ?Intake 480 ml  ?Output 3100 ml  ?Net -2620 ml  ? ? ?  03/04/2022  ?  3:29 AM 03/03/2022  ?  3:55 AM 03/02/2022  ?  3:11 AM  ?Last 3 Weights  ?Weight (lbs) 337 lb 15.4 oz 343 lb 4.1 oz 350 lb 5 oz  ?Weight (kg) 153.3 kg 155.7 kg 158.9 kg  ?   ? ?Telemetry  ?Overnight  telemetry shows A-fib heart rate in the 70s, which I personally reviewed.  ? ?Physical Exam  ? ?Vitals:  ? 03/03/22 1943 03/03/22 2131 03/03/22 2213 03/04/22 0329  ?BP: (!) 131/40   136/69  ?Pulse: 84   67  ?Resp: 20 18 (!) 23 18  ?Temp: 98 ?F (36.7 ?C)   98.7 ?F (37.1 ?C)  ?TempSrc: Oral   Oral  ?SpO2: 94% 95%  90%  ?Weight:    (!) 153.3 kg  ?Height:      ?  ?Intake/Output Summary (Last 24 hours) at 03/04/2022 0751 ?Last data filed at 03/04/2022 [id number] ?Gross per 24 hour  ?Intake 480 ml  ?Output 3100 ml  ?Net -2620 ml  ?  ? ?  03/04/2022  ?  3:29 AM 03/03/2022  ?  3:55 AM 03/02/2022  ?  3:11 AM  ?Last 3 Weights  ?Weight (lbs) 337 lb 15.4 oz 343 lb 4.1 oz 350 lb 5 oz  ?Weight (kg) 153.3 kg 155.7 kg 158.9 kg  ?  Body mass index is 48.49 kg/m?.  ?General: Well nourished, well developed, in no acute distress ?Head: Atraumatic, normal size  ?Eyes: PEERLA, EOMI  ?Neck: Supple, JVD 5 to 7 cm of  water ?Endocrine: No thryomegaly ?Cardiac: Normal S1, S2; irregular rhythm, no murmurs rubs or gallops ?Lungs: Clear to auscultation bilaterally, no wheezing, rhonchi or rales  ?Abd: Soft, nontender, no hepatomegaly  ?Ext: Trace edema, venous insufficiency changes noted with lymphedema ?Musculoskeletal: No deformities, BUE and BLE strength normal and equal ?Skin: Warm and dry, no rashes   ?Neuro: Alert and oriented to person, place, time, and situation, CNII-XII grossly intact, no focal deficits  ?Psych: Normal mood and affect  ? ?Labs  ?High Sensitivity Troponin:   ?Recent Labs  ?Lab 02/13/22 ?1600 02/13/22 ?1800  ?TROPONINIHS 9 9  ?   ?Cardiac EnzymesNo results for input(s): TROPONINI in the last 168 hours. No results for input(s): TROPIPOC in the last 168 hours.  ?Chemistry ?Recent Labs  ?Lab 02/28/22 ?0615 03/01/22 ?[id number] 03/02/22 ?0615 03/03/22 ?0600 03/04/22 ?[id number]  ?NA 134*   < > 135 134* 134*  ?K 3.7   < > 3.4* 3.7 3.5  ?CL 90*   < > 92* 94* 94*  ?CO2 33*   < > 32 31 31  ?GLUCOSE 194*   < > 151* 139* 157*  ?BUN 97*   < > 91* 78* 76*   ?CREATININE 2.28*   < > 2.04* 1.93* 2.10*  ?CALCIUM 8.7*   < > 8.9 8.4* 8.4*  ?PROT 7.1  --   --   --   --   ?ALBUMIN 2.8*  --   --   --   --   ?AST 25  --   --   --   --   ?ALT 26  --   --   --   --   ?ALKPHOS 96  --   --   --   --   ?BILITOT 0.7  --   --   --   --   ?GFRNONAA 30*   < > 34* 37* 33*  ?ANIONGAP 11   < > 11 9 9   ? < > = values in this interval not displayed.  ?  ?Hematology ?Recent Labs  ?Lab 02/27/22 ?0510 02/28/22 ?0630 03/02/22 ?0615  ?WBC 8.6 8.2 8.4  ?RBC 4.52 4.48 4.38  ?HGB 10.2* 10.2* 9.7*  ?HCT 31.8* 31.5* 31.4*  ?MCV 70.4* 70.3* 71.7*  ?MCH 22.6* 22.8* 22.1*  ?MCHC 32.1 32.4 30.9  ?RDW 18.6* 18.8* 19.3*  ?PLT 341 372 420*  ? ?BNPNo results for input(s): BNP, PROBNP in the last 168 hours.  ?DDimer No results for input(s): DDIMER in the last 168 hours.  ? ?Radiology  ?No results found. ? ?Cardiac Studies  ?TTE 02/14/2022 ? ? 1. Left ventricular ejection fraction, by estimation, is 55 to 60%. The  ?left ventricle has normal function. The left ventricle has no regional  ?wall motion abnormalities. There is moderate left ventricular hypertrophy.  ?Left ventricular diastolic function  ? could not be evaluated.  ? 2. Right ventricular systolic function was not well visualized. The right  ?ventricular size is not well visualized.  ? 3. Left atrial size was mildly dilated.  ? 4. The mitral valve is normal in structure. No evidence of mitral valve  ?regurgitation.  ? 5. The aortic valve is normal in structure. There is mild calcification  ?of the aortic valve. Aortic valve regurgitation is trivial.  ? ?Patient Profile  ?Creedence Landaverde Barrett is a 71 y.o. male with morbid obesity, permanent atrial fibrillation, HFpEF, hypertension, OSA, diabetes who was admitted on 02/13/2022 with acute on chronic diastolic heart failure.  Right heart catheterization with mixed  precapillary and postcapillary pulmonary hypertension. ? ?Assessment & Plan  ? ?#Acute on chronic diastolic heart failure ?#Right heart  failure ?-Has been very much diuresed this admission net -22.8 L.  Net -2.6 L overnight.  Weight is down from 186.5 kg to 153.3 kg.  This is a 73 pound weight loss. ?-Right heart cath demonstrated pulmonary capillary wedge pressure of 22 mmHg.  Mean PA pressure was 35.  This is a transpulmonary gradient of 13 mmHg.  To me this is likely mixed precapillary plus capillary pulm hypertension.  Suspect a larger element of HFpEF than previously thought based on transpulmonary gradient. Only treatment is diuresis and treatment of OSA.  ?-He appears effectively euvolemic to me.  He does have lymphedema noted but does not appear volume overloaded.  JVD is minimal. ?-We will transition to 40 mg daily of torsemide.  No further IV diuresis. ?-If he remains stable in the next 24 hours would recommend removing PICC line. ?-OK for one more day of acetazolamide  ?-EF is normal.  Continue home blood pressure medications. ?-Given morbid obesity not a great candidate for SGLT2 inhibitor.  I believe he would be high risk for fungal UTI. ?-Stop trending coox.  He is not in cardiogenic shock. ? ?#Persistent Afib ?-Rate controlled.  Continue diltiazem. ?-On Xarelto. ? ?#CKD ?-At baseline per review of laboratory data. ? ?For questions or updates, please contact [facility] ?Please consult [email or web address] for contact info under  ?   ?Signed, ?[place] T. Audie Box, MD, [facility] ?[place]  ?03/04/2022 7:51 AM  ? ?

## 2022-03-04 NOTE — Progress Notes (Signed)
?PROGRESS NOTE ? ? ? ?[name]  [id number] DOB: 1951/05/08 DOA: 02/13/2022 ?PCP: Tammi Sou, MD  ?[name] morbidly obese, BMI of 56, history of type 2 diabetes mellitus, chronic diastolic CHF, paroxysmal atrial fibrillation, hypertension presented to the ED with dyspnea and progressive weight gain of 25lbs over 4 weeks. ?Admitted with significant volume overload, ?-Diuresed with IV Lasix and treated for UTI as well ?-4/18 was transferred to [facility] from [facility] for cardiac work-up, right heart cath 4/18 noted marked volume overload, predominantly right heart failure, preserved cardiac output ?-4/19 started Lasix drip and metolazone ?-Cards following, now on Lasix 80 Mg twice daily, weight down to 74lbs ? ?Subjective: ?-Feels better overall, abdomen and thighs less swollen/tight, breathing improving ? ?Assessment and Plan: ? ? Acute on chronic diastolic CHF (congestive heart failure) ([place]) ?Echo with EF 55 to 60% with moderate LVH, not well visualized RV ?-Diuresed on IV Lasix and Lasix gtt., he is 21.7 L negative, weight down 74 lbs ?-Right heart cath 4/18 noted markedly elevated filling pressures, R>L heart failure, preserved cardiac output  ?-Creatinine 2.1 today ?-Cardiology following, changed to oral diuretics today torsemide 40 Mg daily ?-Monitor urine output, BMP in a.m. ?-ABG surprisingly without hypercarbia ?-Extremely limited mobility, overall prognosis appears poor, plan for SNF in 48 hours if stable ?Atrial fibrillation ([place]) ?-Remains in A-fib, heart rate controlled on Cardizem and Xarelto ? ?Acute kidney injury superimposed on chronic kidney disease ([place]) ?CKD stage 3b. Hyponatremia. Hypokalemia ?-Baseline creatinine around 1.6-1.8, creatinine had bumped up to 2.3 ?-Cardiorenal syndrome, now improving ? ?Hypertension, essential ?-Stable, continue Imdur ? ?DM2 (diabetes mellitus, type 2) ([place]) ?-CBG stable now continue current dose of insulin 70/30 ? ?Lymphedema of both lower  extremities ?Chronic wounds ?-Worsened in the setting of right heart failure ?-Completed 5 days of IV ceftriaxone for UTI and possible wound infection ?-Continue topical antifungal and local wound care, gets dressing changes every Wednesday ? ?UTI ?-Completed ABX therapy ? ?Morbid obesity ?Calculated BMI is 56.3.  ? ?DVT prophylaxis: Xarelto ?Code Status: DNR ?Family Communication: Discussed with patient in detail, no family at bedside ?Disposition Plan: SNF in 2 to 3 days if stable ? ?Consultants:  ?Cardiology ? ?Procedures:  ? ?Antimicrobials:  ? ? ?Objective: ?Vitals:  ? 03/03/22 2131 03/03/22 2213 03/04/22 0329 03/04/22 1104  ?BP:   136/69 118/64  ?Pulse:   67 74  ?Resp: 18 (!) 23 18 18   ?Temp:   98.7 ?F (37.1 ?C) 98.3 ?F (36.8 ?C)  ?TempSrc:   Oral Oral  ?SpO2: 95%  90% 92%  ?Weight:   (!) 153.3 kg   ?Height:      ? ? ?Intake/Output Summary (Last 24 hours) at 03/04/2022 1412 ?Last data filed at 03/04/2022 1404 ?Gross per 24 hour  ?Intake 720 ml  ?Output 2900 ml  ?Net -2180 ml  ? ?Filed Weights  ? 03/02/22 0311 03/03/22 0355 03/04/22 0329  ?Weight: (!) 158.9 kg (!) 155.7 kg (!) 153.3 kg  ? ? ?Examination: ? ?General exam: Morbidly obese chronically ill male sitting up in bed, AAOx3, no distress ?HEENT: Neck obese unable to assess JVD ?CVS: S1-S2, irregularly irregular rhythm ?Lungs: Poor air movement bilaterally ?Abdomen: Soft, obese, nontender, improving abdominal wall edema  ?Extremities: 2+ edema, bilateral lower leg wounds, excoriations, Unna boots on  ?Psychiatry:  Mood & affect appropriate.  ? ? ? ?Data Reviewed:  ? ?CBC: ?Recent Labs  ?Lab 02/27/22 ?0510 02/28/22 ?0630 03/02/22 ?0615  ?WBC 8.6 8.2 8.4  ?HGB 10.2* 10.2* 9.7*  ?  HCT 31.8* 31.5* 31.4*  ?MCV 70.4* 70.3* 71.7*  ?PLT 341 372 420*  ? ?Basic Metabolic Panel: ?Recent Labs  ?Lab 02/27/22 ?0510 02/28/22 ?0615 03/01/22 ?[id number] 03/02/22 ?0615 03/03/22 ?0600 03/04/22 ?[id number]  ?NA 132* 134* 133* 135 134* 134*  ?K 3.8 3.7 3.5 3.4* 3.7 3.5  ?CL 88* 90* 90* 92* 94*  94*  ?CO2 33* 33* 31 32 31 31  ?GLUCOSE 215* 194* 175* 151* 139* 157*  ?BUN 92* 97* 99* 91* 78* 76*  ?CREATININE 2.22* 2.28* 2.36* 2.04* 1.93* 2.10*  ?CALCIUM 8.8* 8.7* 9.0 8.9 8.4* 8.4*  ?MG 2.2  --   --   --   --   --   ? ?GFR: ?Estimated Creatinine Clearance: 48.7 mL/min (A) (by C-G formula based on SCr of 2.1 mg/dL (H)). ?Liver Function Tests: ?Recent Labs  ?Lab 02/28/22 ?0615  ?AST 25  ?ALT 26  ?ALKPHOS 96  ?BILITOT 0.7  ?PROT 7.1  ?ALBUMIN 2.8*  ? ?No results for input(s): LIPASE, AMYLASE in the last 168 hours. ?No results for input(s): AMMONIA in the last 168 hours. ?Coagulation Profile: ?No results for input(s): INR, PROTIME in the last 168 hours. ?Cardiac Enzymes: ?No results for input(s): CKTOTAL, CKMB, CKMBINDEX, TROPONINI in the last 168 hours. ?BNP (last 3 results) ?No results for input(s): PROBNP in the last 8760 hours. ?HbA1C: ?No results for input(s): HGBA1C in the last 72 hours. ?CBG: ?Recent Labs  ?Lab 03/03/22 ?1134 03/03/22 ?1558 03/03/22 ?2108 03/04/22 ?0603 03/04/22 ?1106  ?GLUCAP 171* 180* 215* 173* 192*  ? ?Lipid Profile: ?No results for input(s): CHOL, HDL, LDLCALC, TRIG, CHOLHDL, LDLDIRECT in the last 72 hours. ?Thyroid Function Tests: ?No results for input(s): TSH, T4TOTAL, FREET4, T3FREE, THYROIDAB in the last 72 hours. ?Anemia Panel: ?No results for input(s): VITAMINB12, FOLATE, FERRITIN, TIBC, IRON, RETICCTPCT in the last 72 hours. ?Urine analysis: ?   ?Component Value Date/Time  ? COLORURINE YELLOW 02/25/2022 1636  ? APPEARANCEUR HAZY (A) 02/25/2022 1636  ? LABSPEC 1.010 02/25/2022 1636  ? PHURINE 5.0 02/25/2022 1636  ? GLUCOSEU NEGATIVE 02/25/2022 1636  ? GLUCOSEU NEGATIVE 10/18/2020 1326  ? HGBUR LARGE (A) 02/25/2022 1636  ? [place] NEGATIVE 02/25/2022 1636  ? [place] Stain NEGATIVE 02/25/2022 1636  ? PROTEINUR NEGATIVE 02/25/2022 1636  ? UROBILINOGEN 1.0 10/18/2020 1326  ? NITRITE NEGATIVE 02/25/2022 1636  ? LEUKOCYTESUR MODERATE (A) 02/25/2022 1636  ? ?Sepsis  Labs: ?@LABRCNTIP (procalcitonin:4,lacticidven:4) ? ?)No results found for this or any previous visit (from the past 240 hour(s)).  ? ?Radiology Studies: ?No results found. ? ? ?Scheduled Meds: ? acetaZOLAMIDE  250 mg Oral Daily  ? Chlorhexidine Gluconate Cloth  6 each Topical Daily  ? diltiazem  120 mg Oral Daily  ? insulin aspart  0-15 Units Subcutaneous TID WC  ? insulin aspart  0-5 Units Subcutaneous QHS  ? insulin aspart protamine- aspart  30 Units Subcutaneous BID WC  ? isosorbide mononitrate  15 mg Oral Daily  ? loratadine  10 mg Oral Daily  ? multivitamin with minerals  1 tablet Oral Daily  ? nystatin cream   Topical BID  ? rivaroxaban  20 mg Oral Daily  ? terazosin  10 mg Oral QHS  ? torsemide  40 mg Oral Daily  ? ?Continuous Infusions: ? ? ? ? LOS: 19 days  ? ? ?Time spent: 62min ? ?Domenic Polite, MD ?[facility] ? ? ?03/04/2022, 2:12 PM  ?  ?

## 2022-03-04 NOTE — Care Management Important Message (Signed)
Important Message ? ?Patient Details  ?Name: [name] ?MRN: [id number] ?Date of Birth: 09/23/1951 ? ? ?Medicare Important Message Given:  Yes ? ? ? ? ?[name] ?03/04/2022, 9:09 AM ?

## 2022-03-04 NOTE — Plan of Care (Signed)
  Problem: Education: Goal: Knowledge of General Education information will improve Description: Including pain rating scale, medication(s)/side effects and non-pharmacologic comfort measures Outcome: Progressing   Problem: Clinical Measurements: Goal: Ability to maintain clinical measurements within normal limits will improve Outcome: Progressing Goal: Respiratory complications will improve Outcome: Progressing   

## 2022-03-05 ENCOUNTER — Inpatient Hospital Stay (HOSPITAL_COMMUNITY): Payer: Medicare Other

## 2022-03-05 LAB — CBC
HCT: 31.8 % — ABNORMAL LOW (ref 39.0–52.0)
Hemoglobin: 10.2 g/dL — ABNORMAL LOW (ref 13.0–17.0)
MCH: 23 pg — ABNORMAL LOW (ref 26.0–34.0)
MCHC: 32.1 g/dL (ref 30.0–36.0)
MCV: 71.6 fL — ABNORMAL LOW (ref 80.0–100.0)
Platelets: 437 10*3/uL — ABNORMAL HIGH (ref 150–400)
RBC: 4.44 MIL/uL (ref 4.22–5.81)
RDW: 19.6 % — ABNORMAL HIGH (ref 11.5–15.5)
WBC: 10.1 10*3/uL (ref 4.0–10.5)
nRBC: 0 % (ref 0.0–0.2)

## 2022-03-05 LAB — GLUCOSE, CAPILLARY
Glucose-Capillary: 141 mg/dL — ABNORMAL HIGH (ref 70–99)
Glucose-Capillary: 136 mg/dL — ABNORMAL HIGH (ref 70–99)
Glucose-Capillary: 200 mg/dL — ABNORMAL HIGH (ref 70–99)
Glucose-Capillary: 163 mg/dL — ABNORMAL HIGH (ref 70–99)

## 2022-03-05 LAB — BASIC METABOLIC PANEL
Anion gap: 9 (ref 5–15)
BUN: 76 mg/dL — ABNORMAL HIGH (ref 8–23)
CO2: 29 mmol/L (ref 22–32)
Calcium: 8.5 mg/dL — ABNORMAL LOW (ref 8.9–10.3)
Chloride: 97 mmol/L — ABNORMAL LOW (ref 98–111)
Creatinine, Ser: 2.01 mg/dL — ABNORMAL HIGH (ref 0.61–1.24)
GFR, Estimated: 35 mL/min — ABNORMAL LOW (ref >60)
Glucose, Bld: 130 mg/dL — ABNORMAL HIGH (ref 70–99)
Potassium: 3.7 mmol/L (ref 3.5–5.1)
Sodium: 135 mmol/L (ref 135–145)

## 2022-03-05 MED ORDER — DICLOFENAC SODIUM 1 % EX GEL
2.0000 g | Freq: Four times a day (QID) | CUTANEOUS | Status: AC
  Administered 2022-03-05 (×3): 2 g via TOPICAL
  Filled 2022-03-05: qty 100

## 2022-03-05 NOTE — Progress Notes (Addendum)
Physical Therapy Treatment ?Patient Details ?Name: [name] ?MRN: [id number] ?DOB: 01-30-1951 ?Today's Date: 03/05/2022 ? ? ?History of Present Illness 71 y.o. male presented to the ER on 02/13/2022 with weight gain and hypoxia. Pt with acute on chronic CHF and UTI. Transferred to [facility] on 4/17 and underwent heart cath on 4/18. Consistent severe reported R knee pain with all WB attempts since transfer to [facility], MD notified 5/2. PMH significant for A-fib, HFpEF, HTN, DMII, morbid obesity, venous stasis, lymphedema. ? ?  ?PT Comments  ? ? Pt received in supine, agreeable to collaborative PT/OT session to focus on seated/standing balance and safe transfer techniques. Pt remains limited due to severe R knee pain with all transfer attempts, per chart review pt has consistently c/o R knee pain since PT eval at [facility] but prior to this was walking short household distances on eval at [facility] hospital with no reported pain, attending MD notified as this will need further investigation. Pt continues to benefit from PT services to progress toward functional mobility goals but has been limited to crouched posture with standing trials and needing +2 mod/maxA for all transfer attempts in past 2 weeks. Plan to progress standing trials with EVA walker next session if new imaging or other information available to ensure safe progression of activity.    ?Recommendations for follow up therapy are one component of a multi-disciplinary discharge planning process, led by the attending physician.  Recommendations may be updated based on patient status, additional functional criteria and insurance authorization. ? ?Follow Up Recommendations ? Skilled nursing-short term rehab (<3 hours/day) ?  ?  ?Assistance Recommended at Discharge Frequent or constant Supervision/Assistance  ?Patient can return home with the following Two people to help with walking and/or transfers;Two people to help with bathing/dressing/bathroom;Assist for transportation;Help  with stairs or ramp for entrance;Assistance with cooking/housework (+2-3 assist for standing, not yet able to walk) ?  ?Equipment Recommendations ? None recommended by PT (TBD)  ?  ?Recommendations for Other Services   ? ? ?  ?Precautions / Restrictions Precautions ?Precautions: Fall ?Precaution Comments: significant wounds BLE with weeping (BLE wrapped), scrotal swelling ?Restrictions ?Weight Bearing Restrictions: No  ?  ? ?Mobility ? Bed Mobility ?Overal bed mobility: Needs Assistance ?Bed Mobility: Supine to Sit, Sit to Supine ?  ?  ?Supine to sit: Min guard, HOB elevated (HOB >50 *) ?Sit to supine: Mod assist, +2 for safety/equipment ?  ?General bed mobility comments: heavy reliance on bed rail, max cues for sequencing/activity pacing, increased time; mod cues for technique for return to bed via reverse log roll with HOB flat, reliance on bed rail and modA for BLE assist to clear EOB; he is able to assist with pulling on overhead rail while in trendelenburg for posterior supine scooting toward HOB at end of session. ?  ? ?Transfers ?Overall transfer level: Needs assistance ?Equipment used: Bilateral platform walker (EVA) ?Transfers: Sit to/from Stand ?Sit to Stand: +2 physical assistance, Mod assist, From elevated surface ?  ?  ?  ?  ? Lateral/Scoot Transfers: Max assist, +2 physical assistance ?General transfer comment: ModA for power up and weight shift forward, pt able to extend knees but unable to extend hips for upright posture due to R knee pain/anxiety; x3 attempts to stand to locked EVA walker. Pt then able to perform seated lateral scooting along EOB to simulate slide board transfer to chair with +2 maxA and use of bed transfer pad. ?  ? ?Ambulation/Gait ?  ?  ?  ?  ?  ?  ?  Pre-gait activities: pt unable to deweight BLE for stepping or pre-gait tasks due to severe R knee pain and unable to extend hips for full upright posture ?  ? ? ? ?  ?Balance Overall balance assessment: Needs  assistance ?Sitting-balance support: Feet supported ?Sitting balance-Leahy Scale: Fair ?Sitting balance - Comments: sitting with 0-1 UE support at EOB without LOB ?  ?  ?Standing balance-Leahy Scale: Poor ?Standing balance comment: pt unable to stand fully upright; needs BUE support for squatting at bedside with heavy forearm reliance on EVA ?  ?  ? ?  ?Cognition Arousal/Alertness: Awake/alert ?Behavior During Therapy: [facility] for tasks assessed/performed, Anxious ?Overall Cognitive Status: Within Functional Limits for tasks assessed ?  ?  ?  ?  ?  ?  ?General Comments: Pt pleasant and agreeable. Requiring increased time for processing due to anxiety. ?  ?  ? ?  ?Exercises   ? ?  ?General Comments General comments (skin integrity, edema, etc.): SpO2 94-96% on RA pre/post exertion; HR to 84 bpm ?  ?  ? ?Pertinent Vitals/Pain Pain Assessment ?Pain Assessment: 0-10 ?Pain Score: 10-Worst pain ever ?Pain Location: bilateral knees (R>L) with wt bearing ?Pain Descriptors / Indicators: Grimacing, Guarding, Sharp ?Pain Intervention(s): Limited activity within patient's tolerance, Monitored during session, Repositioned, Premedicated before session, Other (comment) (RN asked to premedicate him >1hr prior but did not bring meds until session began)  ? ? ? ?PT Goals (current goals can now be found in the care plan section) Acute Rehab PT Goals ?Patient Stated Goal: to get up more, decreased pain ?PT Goal Formulation: With patient ?Time For Goal Achievement: 03/06/22 ?Progress towards PT goals: Progressing toward goals ? ?  ?Frequency ? ? ? Min 3X/week ? ? ? ?  ?PT Plan Current plan remains appropriate  ? ? ?Co-evaluation PT/OT/SLP Co-Evaluation/Treatment: Yes ?Reason for Co-Treatment: Complexity of the patient's impairments (multi-system involvement);For patient/therapist safety;To address functional/ADL transfers ?PT goals addressed during session: Mobility/safety with mobility;Balance;Proper use of DME ?  ?  ? ?  ?AM-PAC PT "6  Clicks" Mobility   ?Outcome Measure ? Help needed turning from your back to your side while in a flat bed without using bedrails?: A Lot ?Help needed moving from lying on your back to sitting on the side of a flat bed without using bedrails?: A Lot ?Help needed moving to and from a bed to a chair (including a wheelchair)?: Total ?Help needed standing up from a chair using your arms (e.g., wheelchair or bedside chair)?: Total ?Help needed to walk in hospital room?: Total ?Help needed climbing 3-5 steps with a railing? : Total ?6 Click Score: 8 ? ?  ?End of Session Equipment Utilized During Treatment: Gait belt ?Activity Tolerance: Patient tolerated treatment well;Patient limited by pain ?Patient left: with call bell/phone within reach;in chair ?Nurse Communication: Mobility status;Other (comment) (pt SpO2 on low end of acceptable range, may need supplemental O2 with exertion when he gets up later in day) ?PT Visit Diagnosis: Unsteadiness on feet (R26.81);Muscle weakness (generalized) (M62.81);Difficulty in walking, not elsewhere classified (R26.2) ?  ? ? ?Time: [id number] ?PT Time Calculation (min) (ACUTE ONLY): 31 min ? ?Charges:  $Therapeutic Activity: 8-22 mins          ?          ? ?Larrisha Babineau P., PTA ?Acute Rehabilitation Services ?Secure Chat Preferred 9a-5:30pm ?Office: [phone]  ? ? ?Kara Pacer Theordore Cisnero ?03/05/2022, 2:20 PM ? ?

## 2022-03-05 NOTE — Progress Notes (Signed)
?Cardiology Progress Note  ?Patient ID: [name] ?MRN: [id number] ?DOB: 01/16/1951 ?Date of Encounter: 03/05/2022 ? ?Primary Cardiologist: [name], MD ? ?Subjective  ? ?Chief Complaint: SOB ? ?HPI: Transition to oral torsemide yesterday.  Doing well. ? ?ROS:  ?All other ROS reviewed and negative. Pertinent positives noted in the HPI.    ? ?Inpatient Medications  ?Scheduled Meds: ? Chlorhexidine Gluconate Cloth  6 each Topical Daily  ? diltiazem  120 mg Oral Daily  ? insulin aspart  0-15 Units Subcutaneous TID WC  ? insulin aspart  0-5 Units Subcutaneous QHS  ? insulin aspart protamine- aspart  30 Units Subcutaneous BID WC  ? isosorbide mononitrate  15 mg Oral Daily  ? loratadine  10 mg Oral Daily  ? multivitamin with minerals  1 tablet Oral Daily  ? nystatin cream   Topical BID  ? rivaroxaban  20 mg Oral Daily  ? terazosin  10 mg Oral QHS  ? torsemide  40 mg Oral Daily  ? ?Continuous Infusions: ? ?PRN Meds: ?acetaminophen **OR** [DISCONTINUED] acetaminophen, albuterol, diclofenac Sodium, guaiFENesin, HYDROcodone-acetaminophen, metoprolol tartrate, ondansetron **OR** ondansetron (ZOFRAN) IV, senna-docusate, sodium chloride flush  ? ?Vital Signs  ? ?Vitals:  ? 03/04/22 2105 03/05/22 0103 03/05/22 0500 03/05/22 6295  ?BP:  129/62  126/63  ?Pulse:  75  80  ?Resp:  14  20  ?Temp: 97.8 ?F (36.6 ?C) 98.3 ?F (36.8 ?C)  97.7 ?F (36.5 ?C)  ?TempSrc: Oral Oral  Oral  ?SpO2:  92%  91%  ?Weight:   (!) 153.3 kg   ?Height:      ? ? ?Intake/Output Summary (Last 24 hours) at 03/05/2022 2841 ?Last data filed at 03/04/2022 1842 ?Gross per 24 hour  ?Intake 720 ml  ?Output 1300 ml  ?Net -580 ml  ? ? ?  03/05/2022  ?  5:00 AM 03/04/2022  ?  3:29 AM 03/03/2022  ?  3:55 AM  ?Last 3 Weights  ?Weight (lbs) 337 lb 15.4 oz 337 lb 15.4 oz 343 lb 4.1 oz  ?Weight (kg) 153.3 kg 153.3 kg 155.7 kg  ?   ? ?Telemetry  ?Overnight telemetry shows A-fib heart rate in the 60s, which I personally reviewed.  ? ?Physical Exam  ? ?Vitals:  ? 03/04/22  2105 03/05/22 0103 03/05/22 0500 03/05/22 3244  ?BP:  129/62  126/63  ?Pulse:  75  80  ?Resp:  14  20  ?Temp: 97.8 ?F (36.6 ?C) 98.3 ?F (36.8 ?C)  97.7 ?F (36.5 ?C)  ?TempSrc: Oral Oral  Oral  ?SpO2:  92%  91%  ?Weight:   (!) 153.3 kg   ?Height:      ?  ?Intake/Output Summary (Last 24 hours) at 03/05/2022 0102 ?Last data filed at 03/04/2022 1842 ?Gross per 24 hour  ?Intake 720 ml  ?Output 1300 ml  ?Net -580 ml  ?  ? ?  03/05/2022  ?  5:00 AM 03/04/2022  ?  3:29 AM 03/03/2022  ?  3:55 AM  ?Last 3 Weights  ?Weight (lbs) 337 lb 15.4 oz 337 lb 15.4 oz 343 lb 4.1 oz  ?Weight (kg) 153.3 kg 153.3 kg 155.7 kg  ?  Body mass index is 48.49 kg/m?.  ?General: Obese male, no acute distress ?Head: Atraumatic, normal size  ?Eyes: PEERLA, EOMI  ?Neck: Supple, JVD 5-7 cmH2O ?Endocrine: No thryomegaly ?Cardiac: Normal S1, S2; irregular rhythm, no murmurs ?Lungs: Diminished breath sounds bilaterally ?Abd: Soft, nontender, no hepatomegaly  ?Ext: Trace edema, lymphedema noted bilaterally in  the lower extremities ?Musculoskeletal: No deformities, BUE and BLE strength normal and equal ?Skin: Warm and dry, no rashes   ?Neuro: Alert and oriented to person, place, time, and situation, CNII-XII grossly intact, no focal deficits  ?Psych: Normal mood and affect  ? ?Labs  ?High Sensitivity Troponin:   ?Recent Labs  ?Lab 02/13/22 ?1600 02/13/22 ?1800  ?TROPONINIHS 9 9  ?   ?Cardiac EnzymesNo results for input(s): TROPONINI in the last 168 hours. No results for input(s): TROPIPOC in the last 168 hours.  ?Chemistry ?Recent Labs  ?Lab 02/28/22 ?0615 03/01/22 ?[id number] 03/03/22 ?0600 03/04/22 ?[id number] 03/05/22 ?0540  ?NA 134*   < > 134* 134* 135  ?K 3.7   < > 3.7 3.5 3.7  ?CL 90*   < > 94* 94* 97*  ?CO2 33*   < > 31 31 29   ?GLUCOSE 194*   < > 139* 157* 130*  ?BUN 97*   < > 78* 76* 76*  ?CREATININE 2.28*   < > 1.93* 2.10* 2.01*  ?CALCIUM 8.7*   < > 8.4* 8.4* 8.5*  ?PROT 7.1  --   --   --   --   ?ALBUMIN 2.8*  --   --   --   --   ?AST 25  --   --   --   --   ?ALT 26   --   --   --   --   ?ALKPHOS 96  --   --   --   --   ?BILITOT 0.7  --   --   --   --   ?GFRNONAA 30*   < > 37* 33* 35*  ?ANIONGAP 11   < > 9 9 9   ? < > = values in this interval not displayed.  ?  ?Hematology ?Recent Labs  ?Lab 02/28/22 ?0630 03/02/22 ?0615 03/05/22 ?0540  ?WBC 8.2 8.4 10.1  ?RBC 4.48 4.38 4.44  ?HGB 10.2* 9.7* 10.2*  ?HCT 31.5* 31.4* 31.8*  ?MCV 70.3* 71.7* 71.6*  ?MCH 22.8* 22.1* 23.0*  ?MCHC 32.4 30.9 32.1  ?RDW 18.8* 19.3* 19.6*  ?PLT 372 420* 437*  ? ?BNPNo results for input(s): BNP, PROBNP in the last 168 hours.  ?DDimer No results for input(s): DDIMER in the last 168 hours.  ? ?Radiology  ?No results found. ? ?Cardiac Studies  ?TTE 02/14/2022 ? 1. Left ventricular ejection fraction, by estimation, is 55 to 60%. The  ?left ventricle has normal function. The left ventricle has no regional  ?wall motion abnormalities. There is moderate left ventricular hypertrophy.  ?Left ventricular diastolic function  ? could not be evaluated.  ? 2. Right ventricular systolic function was not well visualized. The right  ?ventricular size is not well visualized.  ? 3. Left atrial size was mildly dilated.  ? 4. The mitral valve is normal in structure. No evidence of mitral valve  ?regurgitation.  ? 5. The aortic valve is normal in structure. There is mild calcification  ?of the aortic valve. Aortic valve regurgitation is trivial.  ? ?Patient Profile  ?[name] is a 71 y.o. male with morbid obesity, permanent atrial fibrillation, HFpEF, hypertension, OSA, diabetes who was admitted on 02/13/2022 with acute on chronic diastolic heart failure.  Right heart catheterization with mixed precapillary and postcapillary pulmonary hypertension. ? ?Assessment & Plan  ? ?#Acute on chronic diastolic heart failure ?#Right heart failure ?#Pulmonary hypertension ?-Based on right heart cath suspect this is mixed left heart disease as well as some primary pulm  hypertension likely related to obesity and sleep apnea. ?-Net -500 cc  overnight.  Transition to oral diuretics yesterday. ?-Net -23.4 L since admission. ?-Effectively euvolemic.  We will continue with torsemide 40 mg daily.  I have stopped his acetazolamide.  Kidney function is stable. ?-EF is normal.  We will continue with low-salt diet. ?-Not a great candidate for SGLT2 inhibitor given morbid obesity and likely high risk for fungal UTI. ? ?#Persistent atrial fibrillation ?-Continue diltiazem.  On Xarelto. ? ?#CKD ?-Stable. ? ?[facility] will sign off.   ?Medication Recommendations: As above ?Other recommendations (labs, testing, etc): None. ?Follow up as an outpatient: We will arrange outpatient follow-up in 2 to 3 weeks. ? ?For questions or updates, please contact [facility] ?Please consult [email or web address] for contact info under  ? ?Signed, ?Gerri Spore T. Flora Lipps, MD, [facility] ?[place]  [facility]  ?03/05/2022 6:29 AM  ? ?

## 2022-03-05 NOTE — Progress Notes (Signed)
Mobility Specialist Progress Note: ? ? 03/05/22 1340  ?Mobility  ?Activity Moved into chair position in bed  ?Assistive Device None  ?Activity Response Tolerated well  ?$Mobility charge 1 Mobility  ? ?Pt received agreeable to change position in bed for lunch. Declined EOB d/t the rail pinching LE when sitting. Pt left with all needs met.  ? ?Nelta Numbers ?Acute Rehab ?Phone: [phone] ?Office Phone: [phone] ? ?

## 2022-03-05 NOTE — Plan of Care (Signed)

## 2022-03-05 NOTE — Progress Notes (Signed)
Occupational Therapy Treatment ?Patient Details ?Name: [name] ?MRN: [id number] ?DOB: 10/27/1951 ?Today's Date: 03/05/2022 ? ? ?History of present illness 71 y.o. male presented to the ER on 02/13/2022 with weight gain and hypoxia. Pt with acute on chronic CHF and UTI. Transferred to [facility] on 4/17 and underwent heart cath on 4/18. Consistent severe reported R knee pain with all WB attempts since transfer to [facility], MD notified 5/2. PMH significant for A-fib, HFpEF, HTN, DMII, morbid obesity, venous stasis, lymphedema. ?  ?OT comments ? Pt seen in collaboration with PTA to progress standing trials. Utilizing [facility] walker today in hopes that this would facilitate upright posture and confidence. Pt remains limited by significant knee pain in standing (R>L) and feels this is limiting his ability to stand fully upright and take steps. Due to difficulty standing, pt continues to require extensive assist for LB ADLs. Improvements in scrotal edema noted with sling no longer needed and adequate elevation noted bed level. Continue to rec SNF rehab as pt is significantly below functional baseline.   ? ?Recommendations for follow up therapy are one component of a multi-disciplinary discharge planning process, led by the attending physician.  Recommendations may be updated based on patient status, additional functional criteria and insurance authorization. ?   ?Follow Up Recommendations ? Skilled nursing-short term rehab (<3 hours/day)  ?  ?Assistance Recommended at Discharge Frequent or constant Supervision/Assistance  ?Patient can return home with the following ? Two people to help with walking and/or transfers;Two people to help with bathing/dressing/bathroom ?  ?Equipment Recommendations ? Other (comment) (TBD pending progress)  ?  ?Recommendations for Other Services   ? ?  ?Precautions / Restrictions Precautions ?Precautions: Fall ?Precaution Comments: significant wounds BLE with weeping (BLE wrapped), scrotal swelling  (improved 5/2) ?Restrictions ?Weight Bearing Restrictions: No  ? ? ?  ? ?Mobility Bed Mobility ?Overal bed mobility: Needs Assistance ?Bed Mobility: Supine to Sit, Sit to Supine ?  ?  ?Supine to sit: Min guard, HOB elevated (HOB >50 *) ?Sit to supine: Mod assist, +2 for safety/equipment ?  ?General bed mobility comments: heavy reliance on bed rail, max cues for sequencing/activity pacing, increased time; mod cues for technique for return to bed via reverse log roll with HOB flat, reliance on bed rail and modA for BLE assist to clear EOB; he is able to assist with pulling on overhead rail while in trendelenburg for posterior supine scooting toward HOB at end of session. ?  ? ?Transfers ?Overall transfer level: Needs assistance ?Equipment used: Bilateral platform walker (EVA) ?Transfers: Sit to/from Stand ?Sit to Stand: +2 physical assistance, Mod assist, From elevated surface ?  ?  ?  ?  ? Lateral/Scoot Transfers: Max assist, +2 physical assistance ?General transfer comment: ModA for power up and weight shift forward, pt able to extend knees but unable to extend hips for upright posture due to R knee pain/anxiety; x3 attempts to stand to locked EVA walker. Pt then able to perform seated lateral scooting along EOB to simulate slide board transfer to chair with +2 maxA and use of bed transfer pad. ?  ?  ?Balance Overall balance assessment: Needs assistance ?Sitting-balance support: Feet supported ?Sitting balance-Leahy Scale: Fair ?Sitting balance - Comments: sitting with 0-1 UE support at EOB without LOB ?  ?  ?Standing balance-Leahy Scale: Poor ?Standing balance comment: pt unable to stand fully upright; needs BUE support for squatting at bedside with heavy forearm reliance on EVA ?  ?  ?  ?  ?  ?  ?  ?  ?  ?  ?  ?   ? ?  ADL either performed or assessed with clinical judgement  ? ?ADL Overall ADL's : Needs assistance/impaired ?  ?  ?  ?  ?  ?  ?  ?  ?  ?  ?  ?  ?  ?  ?  ?  ?  ?  ?  ?General ADL Comments: Remains  extensive assist for LB ADLs and sit to stand transfers. scrotal swelling much improved with sling and increased time in bed for elevation. ?  ? ?Extremity/Trunk Assessment Upper Extremity Assessment ?Upper Extremity Assessment: Generalized weakness ?  ?Lower Extremity Assessment ?Lower Extremity Assessment: Defer to PT evaluation ?  ?  ?  ? ?Vision   ?Vision Assessment?: No apparent visual deficits ?  ?Perception   ?  ?Praxis   ?  ? ?Cognition Arousal/Alertness: Awake/alert ?Behavior During Therapy: [facility] for tasks assessed/performed, Anxious ?Overall Cognitive Status: Within Functional Limits for tasks assessed ?  ?  ?  ?  ?  ?  ?  ?  ?  ?  ?  ?  ?  ?  ?  ?  ?General Comments: Pt pleasant and agreeable. Requiring increased time for processing due to anxiety. ?  ?  ?   ?Exercises   ? ?  ?Shoulder Instructions   ? ? ?  ?General Comments VSS on RA  ? ? ?Pertinent Vitals/ Pain       Pain Assessment ?Pain Assessment: 0-10 ?Pain Score: 10-Worst pain ever ?Pain Location: bilateral knees (R>L) with wt bearing ?Pain Descriptors / Indicators: Grimacing, Guarding, Sharp ?Pain Intervention(s): Monitored during session, Limited activity within patient's tolerance, Repositioned, Other (comment) (Communicated with RN for premedication prior to session though medication brought in at start of therapy session) ? ?Home Living   ?  ?  ?  ?  ?  ?  ?  ?  ?  ?  ?  ?  ?  ?  ?  ?  ?  ?  ? ?  ?Prior Functioning/Environment    ?  ?  ?  ?   ? ?Frequency ? Min 2X/week  ? ? ? ? ?  ?Progress Toward Goals ? ?OT Goals(current goals can now be found in the care plan section) ? Progress towards OT goals: Progressing toward goals ? ?Acute Rehab OT Goals ?Patient Stated Goal: decrease knee pain, be able to stand ?OT Goal Formulation: With patient ?Time For Goal Achievement: 03/07/22 ?Potential to Achieve Goals: Good ?ADL Goals ?Pt Will Perform Lower Body Bathing: with mod assist;sit to/from stand;sitting/lateral leans ?Pt Will Perform Lower Body  Dressing: with mod assist;sit to/from stand;sitting/lateral leans ?Pt Will Transfer to Toilet: with max assist;with +2 assist;stand pivot transfer;bedside commode ?Pt/caregiver will Perform Home Exercise Program: Increased strength;Both right and left upper extremity;With theraband;Independently;With written HEP provided  ?Plan Discharge plan remains appropriate   ? ?Co-evaluation ? ? ? PT/OT/SLP Co-Evaluation/Treatment: Yes ?Reason for Co-Treatment: Complexity of the patient's impairments (multi-system involvement);For patient/therapist safety;To address functional/ADL transfers ?PT goals addressed during session: Mobility/safety with mobility;Balance;Proper use of DME ?OT goals addressed during session: ADL's and self-care;Other (comment) (functional transfers) ?  ? ?  ?AM-PAC OT "6 Clicks" Daily Activity     ?Outcome Measure ? ? Help from another person eating meals?: None ?Help from another person taking care of personal grooming?: A Little ?Help from another person toileting, which includes using toliet, bedpan, or urinal?: Total ?Help from another person bathing (including washing, rinsing, drying)?: A Lot ?Help from another person to put on and taking off  regular upper body clothing?: A Little ?Help from another person to put on and taking off regular lower body clothing?: Total ?6 Click Score: 14 ? ?  ?End of Session Equipment Utilized During Treatment: Gait belt;Other (comment) (eva walker) ? ?OT Visit Diagnosis: Unsteadiness on feet (R26.81);Other abnormalities of gait and mobility (R26.89) ?  ?Activity Tolerance Patient limited by pain ?  ?Patient Left in bed;with call bell/phone within reach;Other (comment) (with PTA at bedside) ?  ?Nurse Communication Mobility status ?  ? ?   ? ?Time: [id number] ?OT Time Calculation (min): 34 min ? ?Charges: OT General Charges ?$OT Visit: 1 Visit ?OT Treatments ?$Therapeutic Activity: 8-22 mins ? ?Malachy Chamber, OTR/L ?Acute Rehab Services ?Office: [phone]  ? ?[name] ?03/05/2022, 2:41 PM ?

## 2022-03-05 NOTE — TOC Progression Note (Signed)
Transition of Care (TOC) - Progression Note  ? ? ?Patient Details  ?Name: [name] ?MRN: [id number] ?Date of Birth: 1951/05/28 ? ?Transition of Care (TOC) CM/SW Contact  ?Shantara Goosby Wynn Banker, LCSWA ?Phone Number: ?03/05/2022, 2:57 PM ? ?Clinical Narrative:    ?Pt is not medically stable but [facility] SNF is still willing to take pt. Auth expires today but CSW is following up on a one day extension.  ? ? ?Expected Discharge Plan: Skilled Nursing Facility ?Barriers to Discharge: Continued Medical Work up ? ?Expected Discharge Plan and Services ?Expected Discharge Plan: Skilled Nursing Facility ?In-house Referral: NA ?Discharge Planning Services: CM Consult ?  ?Living arrangements for the past 2 months: Hotel/Motel ?                ?  ?DME Agency: NA ?  ?  ?  ?  ?  ?  ?  ?  ? ? ?Social Determinants of Health (SDOH) Interventions ?  ? ?Readmission Risk Interventions ? ?  02/15/2022  ?  2:32 PM  ?Readmission Risk Prevention Plan  ?Transportation Screening Complete  ?PCP or Specialist Appt within 5-7 Days Complete  ? ? ?

## 2022-03-05 NOTE — Progress Notes (Addendum)
?PROGRESS NOTE ? ? ? ?[name]  [id number] DOB: 1951/08/27 DOA: 02/13/2022 ?PCP: Tammi Sou, MD  ?[name] morbidly obese, BMI of 56, history of type 2 diabetes mellitus, chronic diastolic CHF, paroxysmal atrial fibrillation, hypertension presented to the ED with dyspnea and progressive weight gain of 25lbs over 4 weeks. ?Admitted with significant volume overload, ?-Diuresed with IV Lasix and treated for UTI as well ?-4/18 was transferred to [facility] from [facility] for cardiac work-up, right heart cath 4/18 noted marked volume overload, predominantly right heart failure, preserved cardiac output ?-4/19 started Lasix drip and metolazone ?-Cards following, now on Lasix 80 Mg twice daily, weight down to 74lbs ? ?Subjective: ?-Breathing and swelling improving, now switched to oral torsemide, pain in his legs ? ?Assessment and Plan: ? ? Acute on chronic diastolic CHF (congestive heart failure) ([place]) ?Echo with EF 55 to 60% with moderate LVH, not well visualized RV ?-Right heart cath 4/18 noted markedly elevated filling pressures, R>L heart failure, preserved cardiac output  ?-Diuresed on Lasix gtt. and high-dose IV Lasix, he is 23.1 L negative, weight down to 74lbs ?-Creatinine stable at 2.0 ?-Cardiology following, changed to oral torsemide 40 Mg daily ?-Discharge planning, for SNF soon ?-ABG surprisingly without hypercarbia ? ?Right knee pain, limited mobility ?-suspect severe OA ?-Today upon assessment by physical therapy this afternoon he was noted to have severe pain and discomfort in his right knee extremely limiting his mobility, prior to this was able to ambulate short distances in the hospital without much discomfort ?-check x-ray right knee  ? ?Atrial fibrillation ([place]) ?-Remains in A-fib, heart rate controlled on Cardizem and Xarelto ? ?Acute kidney injury superimposed on chronic kidney disease ([place]) ?CKD stage 3b. Hyponatremia. Hypokalemia ?-Baseline creatinine around 1.6-1.8, creatinine had bumped  up to 2.3 ?-Cardiorenal syndrome, now improving ? ?Hypertension, essential ?-Stable, continue Imdur ? ?DM2 (diabetes mellitus, type 2) ([place]) ?-CBG stable now continue current dose of insulin 70/30 ? ?Lymphedema of both lower extremities ?Chronic wounds ?-Worsened in the setting of right heart failure ?-Completed 5 days of IV ceftriaxone for UTI and possible wound infection ?-Continue topical antifungal and local wound care, gets dressing changes every Wednesday ? ?UTI ?-Completed ABX therapy ? ?Morbid obesity ?Calculated BMI is 56.3.  ? ?DVT prophylaxis: Xarelto ?Code Status: DNR ?Family Communication: Discussed with patient in detail, no family at bedside ?Disposition Plan: SNF tomorrow if stable ? ?Consultants:  ?Cardiology ? ?Procedures:  ? ?Antimicrobials:  ? ? ?Objective: ?Vitals:  ? 03/04/22 2105 03/05/22 0103 03/05/22 0500 03/05/22 [id number]  ?BP:  129/62  126/63  ?Pulse:  75  80  ?Resp:  14  20  ?Temp: 97.8 ?F (36.6 ?C) 98.3 ?F (36.8 ?C)  97.7 ?F (36.5 ?C)  ?TempSrc: Oral Oral  Oral  ?SpO2:  92%  91%  ?Weight:   (!) 153.3 kg   ?Height:      ? ? ?Intake/Output Summary (Last 24 hours) at 03/05/2022 1433 ?Last data filed at 03/05/2022 0700 ?Gross per 24 hour  ?Intake 240 ml  ?Output 1050 ml  ?Net -810 ml  ? ?Filed Weights  ? 03/03/22 0355 03/04/22 0329 03/05/22 0500  ?Weight: (!) 155.7 kg (!) 153.3 kg (!) 153.3 kg  ? ? ?Examination: ? ?General exam: Morbidly obese chronically ill male sitting up in bed, AAOx3, no distress ?HEENT: Neck obese unable to assess JVD ?CVS: S1-S2, irregularly irregular rhythm ?Lungs: Poor air movement bilaterally otherwise clear ?Abdomen: Soft, obese, nontender, abdominal wall edema is improving  ?Extremities: 1+ edema, bilateral  lower leg wounds, excoriations, Unna boots on  ?Psychiatry:  Mood & affect appropriate.  ? ? ? ?Data Reviewed:  ? ?CBC: ?Recent Labs  ?Lab 02/27/22 ?0510 02/28/22 ?0630 03/02/22 ?0615 03/05/22 ?0540  ?WBC 8.6 8.2 8.4 10.1  ?HGB 10.2* 10.2* 9.7* 10.2*  ?HCT 31.8* 31.5*  31.4* 31.8*  ?MCV 70.4* 70.3* 71.7* 71.6*  ?PLT 341 372 420* 437*  ? ?Basic Metabolic Panel: ?Recent Labs  ?Lab 02/27/22 ?0510 02/28/22 ?0615 03/01/22 ?[id number] 03/02/22 ?0615 03/03/22 ?0600 03/04/22 ?[id number] 03/05/22 ?0540  ?NA 132*   < > 133* 135 134* 134* 135  ?K 3.8   < > 3.5 3.4* 3.7 3.5 3.7  ?CL 88*   < > 90* 92* 94* 94* 97*  ?CO2 33*   < > 31 32 31 31 29   ?GLUCOSE 215*   < > 175* 151* 139* 157* 130*  ?BUN 92*   < > 99* 91* 78* 76* 76*  ?CREATININE 2.22*   < > 2.36* 2.04* 1.93* 2.10* 2.01*  ?CALCIUM 8.8*   < > 9.0 8.9 8.4* 8.4* 8.5*  ?MG 2.2  --   --   --   --   --   --   ? < > = values in this interval not displayed.  ? ?GFR: ?Estimated Creatinine Clearance: 50.8 mL/min (A) (by C-G formula based on SCr of 2.01 mg/dL (H)). ?Liver Function Tests: ?Recent Labs  ?Lab 02/28/22 ?0615  ?AST 25  ?ALT 26  ?ALKPHOS 96  ?BILITOT 0.7  ?PROT 7.1  ?ALBUMIN 2.8*  ? ?No results for input(s): LIPASE, AMYLASE in the last 168 hours. ?No results for input(s): AMMONIA in the last 168 hours. ?Coagulation Profile: ?No results for input(s): INR, PROTIME in the last 168 hours. ?Cardiac Enzymes: ?No results for input(s): CKTOTAL, CKMB, CKMBINDEX, TROPONINI in the last 168 hours. ?BNP (last 3 results) ?No results for input(s): PROBNP in the last 8760 hours. ?HbA1C: ?No results for input(s): HGBA1C in the last 72 hours. ?CBG: ?Recent Labs  ?Lab 03/04/22 ?1106 03/04/22 ?1614 03/04/22 ?2113 03/05/22 ?[id number] 03/05/22 ?1129  ?GLUCAP 192* 183* 171* 141* 136*  ? ?Lipid Profile: ?No results for input(s): CHOL, HDL, LDLCALC, TRIG, CHOLHDL, LDLDIRECT in the last 72 hours. ?Thyroid Function Tests: ?No results for input(s): TSH, T4TOTAL, FREET4, T3FREE, THYROIDAB in the last 72 hours. ?Anemia Panel: ?No results for input(s): VITAMINB12, FOLATE, FERRITIN, TIBC, IRON, RETICCTPCT in the last 72 hours. ?Urine analysis: ?   ?Component Value Date/Time  ? COLORURINE YELLOW 02/25/2022 1636  ? APPEARANCEUR HAZY (A) 02/25/2022 1636  ? LABSPEC 1.010 02/25/2022 1636   ? PHURINE 5.0 02/25/2022 1636  ? GLUCOSEU NEGATIVE 02/25/2022 1636  ? GLUCOSEU NEGATIVE 10/18/2020 1326  ? HGBUR LARGE (A) 02/25/2022 1636  ? [place] NEGATIVE 02/25/2022 1636  ? [place] Stain NEGATIVE 02/25/2022 1636  ? PROTEINUR NEGATIVE 02/25/2022 1636  ? UROBILINOGEN 1.0 10/18/2020 1326  ? NITRITE NEGATIVE 02/25/2022 1636  ? LEUKOCYTESUR MODERATE (A) 02/25/2022 1636  ? ?Sepsis Labs: ?@LABRCNTIP (procalcitonin:4,lacticidven:4) ? ?)No results found for this or any previous visit (from the past 240 hour(s)).  ? ?Radiology Studies: ?No results found. ? ? ?Scheduled Meds: ? Chlorhexidine Gluconate Cloth  6 each Topical Daily  ? diltiazem  120 mg Oral Daily  ? insulin aspart  0-15 Units Subcutaneous TID WC  ? insulin aspart  0-5 Units Subcutaneous QHS  ? insulin aspart protamine- aspart  30 Units Subcutaneous BID WC  ? isosorbide mononitrate  15 mg Oral Daily  ? loratadine  10 mg Oral Daily  ?  multivitamin with minerals  1 tablet Oral Daily  ? nystatin cream   Topical BID  ? rivaroxaban  20 mg Oral Daily  ? terazosin  10 mg Oral QHS  ? torsemide  40 mg Oral Daily  ? ?Continuous Infusions: ? ? ? ? LOS: 20 days  ? ? ?Time spent: 33min ? ?Domenic Polite, MD ?[facility] ? ? ?03/05/2022, 2:33 PM  ?  ?

## 2022-03-06 ENCOUNTER — Inpatient Hospital Stay (HOSPITAL_COMMUNITY): Payer: Medicare Other

## 2022-03-06 DIAGNOSIS — R609 Edema, unspecified: Secondary | ICD-10-CM

## 2022-03-06 HISTORY — PX: OTHER SURGICAL HISTORY: SHX169

## 2022-03-06 LAB — BASIC METABOLIC PANEL
Anion gap: 10 (ref 5–15)
BUN: 67 mg/dL — ABNORMAL HIGH (ref 8–23)
CO2: 28 mmol/L (ref 22–32)
Calcium: 8.6 mg/dL — ABNORMAL LOW (ref 8.9–10.3)
Chloride: 100 mmol/L (ref 98–111)
Creatinine, Ser: 1.9 mg/dL — ABNORMAL HIGH (ref 0.61–1.24)
GFR, Estimated: 37 mL/min — ABNORMAL LOW (ref >60)
Glucose, Bld: 155 mg/dL — ABNORMAL HIGH (ref 70–99)
Potassium: 3.7 mmol/L (ref 3.5–5.1)
Sodium: 138 mmol/L (ref 135–145)

## 2022-03-06 LAB — SYNOVIAL CELL COUNT + DIFF, W/ CRYSTALS
Eosinophils-Synovial: 0 % (ref 0–1)
Lymphocytes-Synovial Fld: 6 % (ref 0–20)
Monocyte-Macrophage-Synovial Fluid: 12 % — ABNORMAL LOW (ref 50–90)
Neutrophil, Synovial: 82 % — ABNORMAL HIGH (ref 0–25)
WBC, Synovial: 4500 /mm3 — ABNORMAL HIGH (ref 0–200)

## 2022-03-06 LAB — GLUCOSE, CAPILLARY
Glucose-Capillary: 155 mg/dL — ABNORMAL HIGH (ref 70–99)
Glucose-Capillary: 184 mg/dL — ABNORMAL HIGH (ref 70–99)
Glucose-Capillary: 148 mg/dL — ABNORMAL HIGH (ref 70–99)
Glucose-Capillary: 243 mg/dL — ABNORMAL HIGH (ref 70–99)

## 2022-03-06 MED ORDER — ISOSORBIDE MONONITRATE ER 30 MG PO TB24: 15.0000 mg | ORAL_TABLET | Freq: Every day | ORAL | 3 refills | Status: DC

## 2022-03-06 MED ORDER — TORSEMIDE 40 MG PO TABS: 40.0000 mg | ORAL_TABLET | Freq: Every day | ORAL | 0 refills | Status: DC

## 2022-03-06 MED ORDER — INSULIN ASPART PROT & ASPART (70-30 MIX) 100 UNIT/ML ~~LOC~~ SUSP: 30.0000 [IU] | Freq: Two times a day (BID) | SUBCUTANEOUS | 0 refills | Status: DC

## 2022-03-06 MED ORDER — BUPIVACAINE HCL (PF) 0.5 % IJ SOLN
10.0000 mL | Freq: Once | INTRAMUSCULAR | Status: DC
  Filled 2022-03-06: qty 10

## 2022-03-06 MED ORDER — METHYLPREDNISOLONE ACETATE 40 MG/ML IJ SUSP
40.0000 mg | Freq: Once | INTRAMUSCULAR | Status: DC
  Filled 2022-03-06: qty 1

## 2022-03-06 MED ORDER — SENNOSIDES-DOCUSATE SODIUM 8.6-50 MG PO TABS
1.0000 | ORAL_TABLET | Freq: Every day | ORAL | Status: DC
Start: 2022-03-06 — End: 2023-06-26

## 2022-03-06 MED ORDER — HYDROCODONE-ACETAMINOPHEN 5-325 MG PO TABS: 1.0000 | ORAL_TABLET | Freq: Four times a day (QID) | ORAL | 0 refills | Status: DC | PRN

## 2022-03-06 NOTE — Progress Notes (Signed)
Received order for PICC line removal. Instructed patient on procedure. HOB less than 45*, held breath upon line removal. Pressure held for 5+min with no s/sx of bleeding. Instructed patient to monitor and report as needed for any s/sx of bleeding and to remain in bed for , and to keep drsg CDI for 24hrs. Pt VU. Nurse notified. Tomasita Morrow, RN VAST ?

## 2022-03-06 NOTE — TOC Transition Note (Signed)
Transition of Care (TOC) - CM/SW Discharge Note ? ? ?Patient Details  ?Name: [name] ?MRN: [id number] ?Date of Birth: [birth year] ? ?Transition of Care (TOC) CM/SW Contact:  ?Ivette Loyal, LCSWA ?Phone Number: ?03/06/2022, 3:48 PM ? ? ?Clinical Narrative:    ?[facility] will [facility]: [name] ?Anticipated DC date: 03/06/2022 ?Family notified: Attempted pt brother ?Transport by: Sharin Mons ? ? ?Per MD patient ready for DC to [facility] room 506-B. RN to call report prior to discharge [phone]). RN, patient, patient's family, and facility notified of DC. Discharge Summary and FL2 sent to facility. DC packet on chart. Ambulance transport requested for patient.  ? ?CSW will sign off for now as social work intervention is no longer needed. Please consult [place] again if new needs arise. ?  ? ? ?Final next level of care: Skilled Nursing Facility ?Barriers to Discharge: Continued Medical Work up ? ? ?Patient Goals and CMS Choice ?Patient states their goals for this hospitalization and ongoing recovery are:: hopes to wean off the oxygen ?  ?  ? ?Discharge Placement ?  ?           ?  ?  ?  ?  ? ?Discharge Plan and Services ?In-house Referral: NA ?Discharge Planning Services: CM Consult ?           ?  ?DME Agency: NA ?  ?  ?  ?  ?  ?  ?  ?  ? ?Social Determinants of Health (SDOH) Interventions ?  ? ? ?Readmission Risk Interventions ? ?  02/15/2022  ?  2:32 PM  ?Readmission Risk Prevention Plan  ?Transportation Screening Complete  ?PCP or Specialist Appt within 5-7 Days Complete  ? ? ? ? ? ?

## 2022-03-06 NOTE — Progress Notes (Signed)
Mobility Specialist Progress Note: ? ? 03/06/22 1155  ?Mobility  ?Activity  ?(bed level exercise)  ?Range of Motion/Exercises Active;Passive;Right leg;Left leg  ?Assistive Device None  ?Activity Response Tolerated fair  ?$Mobility charge 1 Mobility  ? ?Pt agreeable to mobility session. Further mobility deferred d/t injection later today hopefully improving pain tolerance for OOB mobility. Pt performed multiple bed level exercises, tolerated fair. Left with all needs met.  ? ?Nelta Numbers ?Acute Rehab ?Phone: [phone] ?Office Phone: [phone] ? ?

## 2022-03-06 NOTE — Progress Notes (Signed)
Bilateral lower extremity venous duplex completed. ?Refer to "CV Proc" under chart review to view preliminary results. ? ?03/06/2022 3:36 PM ?Eula Fried., MHA, RVT, RDCS, RDMS   ?

## 2022-03-06 NOTE — Progress Notes (Signed)
Mobility Specialist Progress Note: ? ? 03/06/22 1305  ?Mobility  ?Activity Transferred to/from [facility]  ?Level of Assistance +2 (takes two people)  ?Assistive Device Four wheel walker  ?Activity Response Tolerated fair  ?$Mobility charge 1 Mobility  ? ?Pt requesting to get to [facility]. Required +2 to stand from EOB, thrid person to move furniture around as pt still unable to take steps. Left with call bell in reach, will f/u to get back to bed.  ? ?Addison Lank ?Acute Rehab ?Phone: [phone] ?Office Phone: [phone] ? ?

## 2022-03-06 NOTE — Procedures (Signed)
Procedure: Right knee aspiration and injection ?  ?Indication: Right knee effusion(s) ?  ?Surgeon: Charma Igo, PA-C ?  ?Assist: None ?  ?Anesthesia: Topical refrigerant ?  ?EBL: None ?  ?Complications: None ?  ?Findings: After risks/benefits explained patient desires to undergo procedure. Consent obtained and time out performed. The right knee was sterilely prepped and aspirated. 58ml clear yellow then bloody fluid obtained. 35ml 0.5% Marcaine and 40mg  depomedrol instilled. Pt tolerated the procedure well. ?  ?  ?  ? , PA-C ?Orthopedic Surgery ?[phone] ? ?

## 2022-03-06 NOTE — Discharge Summary (Signed)
Physician Discharge Summary  ?[name] [id number] DOB: 23-Nov-1950 DOA: 02/13/2022 ? ?PCP: Tammi Sou, MD ? ?Admit date: 02/13/2022 ?Discharge date: 03/06/2022 ? ?Time spent 45 minutes ? ?Recommendations for Outpatient Follow-up:  ?Cardiology Dr. Debara Pickett in 1 to 2 weeks ?CHF TOC clinic in 1 week ?Please check BMP in 4 to 5 days ? ? ?Discharge Diagnoses:  ?Principal Problem: ?  Acute on chronic diastolic CHF (congestive heart failure) ([place]) ?Morbid obesity ?Very poor functional status ?Chronic leg wounds and lymphedema ?Bilateral osteoarthritis ?  Atrial fibrillation ([place]) ?  Acute kidney injury superimposed on chronic kidney disease ([place]) ?  DM2 (diabetes mellitus, type 2) ([place]) ?  Hypertension, essential ?  Lymphedema of both lower extremities ?  Class 3 obesity (HCC) ?DNR ? ? ?Discharge Condition: Stable ? ?Diet recommendation: Low-sodium, diabetic ? ?Filed Weights  ? 03/04/22 0329 03/05/22 0500 03/06/22 0500  ?Weight: (!) 153.3 kg (!) 153.3 kg (!) 151.6 kg  ? ? ?History of present illness:  ?[name] morbidly obese, BMI of 56, history of type 2 diabetes mellitus, chronic diastolic CHF, paroxysmal atrial fibrillation, hypertension presented to the ED with dyspnea and progressive weight gain of 25lbs over 4 weeks. ?Admitted with significant volume overload, ?-Diuresed with IV Lasix and treated for UTI as well ?-4/18 was transferred to [facility] from [facility] for cardiac work-up, right heart cath 4/18 noted marked volume overload, predominantly right heart failure, preserved cardiac output ?-4/19 started Lasix drip and metolazone ?-Cards following, now on Lasix 80 Mg twice daily, weight down to 74lbs ?  ? ?Hospital Course:  ? ? Acute on chronic diastolic CHF (congestive heart failure) ([place]) ?Echo with EF 55 to 60% with moderate LVH, not well visualized RV ?-Right heart cath 4/18 noted markedly elevated filling pressures, R>L heart failure, preserved cardiac output,  ?--ABG surprisingly without  hypercarbia ?-Diuresed on Lasix gtt. and high-dose IV Lasix, he is 25 L negative, weight down to 78 pounds ?-Creatinine stable at 2.0 ?-Followed by cardiology this admission, changed to oral torsemide 40 Mg daily ?-Discharge to SNF for short-term rehab ?-Close follow-up in heart failure, TOC clinic and Dr. Debara Pickett ?  ?Right knee pain, limited mobility ?-suspect severe OA ?-Chronic knee pain, worse in the last few weeks, seen by orthopedics in consultation, suspected to have severe osteoarthritis based on x-ray, right knee aspiration and injection of Marcaine and Depo-Medrol instilled ?-Ideally needs both knees replaced but too chronically ill for anesthesia ?-Follow-up with orthopedics in 1 to 2 months ?  ?Atrial fibrillation ([place]) ?-Remains in A-fib, heart rate controlled on Cardizem and Xarelto ?  ?Acute kidney injury superimposed on chronic kidney disease ([place]) ?CKD stage 3b. Hyponatremia. Hypokalemia ?-Baseline creatinine around 1.6-1.8, creatinine had bumped up to 2.3 ?-Cardiorenal syndrome, now improving, creatinine 2.0 range at discharge ?  ?Hypertension, essential ?-Stable, continue Imdur ?  ?DM2 (diabetes mellitus, type 2) ([place]) ?-CBG stable now continue current dose of insulin 70/30 ?  ?Lymphedema of both lower extremities ?Chronic wounds ?-Worsened in the setting of right heart failure ?-Completed 5 days of IV ceftriaxone for UTI and possible wound infection ?-Continue topical antifungal and local wound care, gets dressing changes every Wednesday ?  ?UTI ?-Completed ABX therapy ?  ?Morbid obesity ?Calculated BMI is 56.3.  ?  ?Code Status: DNR ? ?Procedures: ?5/3, right knee aspiration and injection of Marcaine and 40 mg Depo-Medrol ? ?Consultations: ?Cardiology, orthopedics ? ?Discharge Exam: ?Vitals:  ? 03/06/22 0816 03/06/22 1120  ?BP: 140/73 139/67  ?Pulse: 79 72  ?Resp:  20  ?Temp:  98 ?F (36.7 ?C)  ?SpO2:  94%  ? ?General exam: Morbidly obese chronically ill male sitting up in bed, AAOx3, no  distress ?HEENT: Neck obese unable to assess JVD ?CVS: S1-S2, irregularly irregular rhythm ?Lungs: Poor air movement bilaterally otherwise clear ?Abdomen: Soft, obese, nontender, abdominal wall edema is improving  ?Extremities: Right knee swelling and tenderness, 1+ edema, bilateral lower leg wounds, excoriations, Unna boots on  ?Psychiatry:  Mood & affect appropriate.  ?Discharge Instructions ? ? ?Discharge Instructions   ? ? Diet - low sodium heart healthy   Complete by: As directed ?  ? Diet Carb Modified   Complete by: As directed ?  ? Discharge wound care:   Complete by: As directed ?  ? Routine wound care, 4 layers of wrap on both lower extremity changed weekly, last changed 5/3  ? Increase activity slowly   Complete by: As directed ?  ? ?  ? ?Allergies as of 03/06/2022   ? ?   Reactions  ? Other Other (See Comments)  ? Sneezing, coughing  ? Hydralazine Other (See Comments)  ? Drowsiness/sedation/mental fog  ? ?  ? ?  ?Medication List  ?  ? ?STOP taking these medications   ? ?amLODipine 10 MG tablet ?Commonly known as: NORVASC ?  ?butalbital-apap-caffeine-codeine 50-325-40-30 MG capsule ?Commonly known as: FIORICET WITH CODEINE ?  ?furosemide 40 MG tablet ?Commonly known as: LASIX ?  ?Insulin Lispro Prot & Lispro (75-25) 100 UNIT/ML Kwikpen ?Commonly known as: HumaLOG Mix 75/25 KwikPen ?  ?metFORMIN 1000 MG tablet ?Commonly known as: GLUCOPHAGE ?  ?valsartan 320 MG tablet ?Commonly known as: DIOVAN ?  ? ?  ? ?TAKE these medications   ? ?b complex vitamins capsule ?Take 1 capsule by mouth every morning. ?  ?B-D ULTRAFINE III SHORT PEN 31G X 8 MM Misc ?Generic drug: Insulin Pen Needle ?USE 1 PENNEEDLE TWICE DAILY ?  ?cetirizine 10 MG tablet ?Commonly known as: ZYRTEC ?Take 10 mg by mouth at bedtime. ?  ?CHROMIUM GTF PO ?Take 1 tablet by mouth daily. ?  ?Co Q 10 10 MG Caps ?Take 400 mg by mouth daily. Reported on 02/19/2016 ?  ?diltiazem 120 MG 24 hr capsule ?Commonly known as: Tiazac ?Take 1 capsule (120 mg total)  by mouth daily. ?  ?HYDROcodone-acetaminophen 5-325 MG tablet ?Commonly known as: NORCO/VICODIN ?Take 1-2 tablets by mouth every 6 (six) hours as needed for moderate pain. 1-2 tabs po bid prn pain ?What changed:  ?how much to take ?how to take this ?when to take this ?reasons to take this ?  ?insulin aspart protamine- aspart (70-30) 100 UNIT/ML injection ?Commonly known as: NOVOLOG MIX 70/30 ?Inject 0.3 mLs (30 Units total) into the skin 2 (two) times daily with a meal. ?  ?isosorbide mononitrate 30 MG 24 hr tablet ?Commonly known as: IMDUR ?Take 0.5 tablets (15 mg total) by mouth daily. ?What changed: how much to take ?  ?multivitamin tablet ?Take 1 tablet by mouth daily. ?  ?OneTouch Ultra test strip ?Generic drug: glucose blood ?CHECK BLOOD SUGAR TWICE  DAILY ?  ?rivaroxaban 20 MG Tabs tablet ?Commonly known as: Xarelto ?TAKE 1 TABLET BY MOUTH ONCE DAILY WITH SUPPER ?What changed:  ?how much to take ?when to take this ?additional instructions ?  ?senna-docusate 8.6-50 MG tablet ?Commonly known as: Senokot-S ?Take 1 tablet by mouth at bedtime. ?  ?terazosin 10 MG capsule ?Commonly known as: HYTRIN ?TAKE 1 CAPSULE BY MOUTH  ONCE DAILY AT BEDTIME ?  ?  Torsemide 40 MG Tabs ?Take 40 mg by mouth daily. ?Start taking on: Mar 07, 2022 ?  ?vitamin E 180 MG (400 UNITS) capsule ?Take 400 Units by mouth every morning. Vitamin E with Selenium ?  ? ?  ? ?  ?  ? ? ?  ?Discharge Care Instructions  ?(From admission, onward)  ?  ? ? ?  ? ?  Start     Ordered  ? 03/06/22 0000  Discharge wound care:       ?Comments: Routine wound care, 4 layers of wrap on both lower extremity changed weekly, last changed 5/3  ? 03/06/22 1355  ? ?  ?  ? ?  ? ?Allergies  ?Allergen Reactions  ? Other Other (See Comments)  ?  Sneezing, coughing  ? Hydralazine Other (See Comments)  ?  Drowsiness/sedation/mental fog  ? ? Follow-up Information   ? ? [facility]. Go on 03/12/2022.   ?Specialty: Cardiology ?Why: at [facility]   call the above number for pass number to park ?Hospital follow up  ?PLEASE bring list of medications ?FREE valet parking, [address]tr[address], [address] ?Contact information: ?[address] ?[phone] [phone] ?Jacques Earthly

## 2022-03-06 NOTE — Progress Notes (Signed)
Patient is alert and oriented x4 and has no questions about procedure. Patient's signed inform consent placed in chart. Primary RN informed. ?

## 2022-03-06 NOTE — Consult Note (Signed)
WOC Nurse Follow Up Note: ?Patient receiving care in [facility] 3E29 ?Assistance from [employer] students, [place] and [place] appreciated. ?Wound type: Chronic Lymphedema and venous stasis with erythema and edema. Blistering on the RLE just below the knee has greatly improved. Small open wound on the right shin 0.3 x 0.3. Partial thickness open wound on the anterior upper LLE. Partial thickness open wound on the posterior side of the RLE just below the knee. ?Dressing procedure/placement/frequency: ?4 layer compression wraps removed. Bilateral LE were cleaned with soap and water.  Sween moisturizing lotion applied to the BLE except the open areas where cut to fit pieces of Aquacel Advantage was applied. Xeroform gauze placed over the partial thickness wound area on the LLE. Nystatin applied to the bilateral toes. Painful to the patient when the legs were lifted, especially the right side. ABD pads placed around the bilateral calf area to hold Aquacel and Xeroform in place and for padding. Both legs were then wrapped with Kerlix followed by 4 layer (Profore) compression wraps. These are to be changed once a week on Wednesday. Patient tolerated the procedure well. Premedicated prior to procedure.  ? ?Renaldo Reel. Katrinka Blazing, MSN, RN, CMSRN, AGCNS, WTA ?Wound Treatment Associate ?Pager [phone]  ?

## 2022-03-06 NOTE — Consult Note (Signed)
Reason for Consult:Right knee pain ?Referring Physician: Domenic Polite ?Time called: 1021 ?Time at bedside: 1043 ? ? ?[name] is an 71 y.o. male.  ?HPI: Kole was admitted 3w ago with CHF and has been aggressively diuresed. He's been working with PT/OT but has been unable to ambulate given severe right knee pain. He notes that his knee has been hurting for years but seems worse since hospitalization as he was able to ambulate prior to admission. He does admit to a hx/o gout a long time ago but can't really remember the details. ? ?Past Medical History:  ?Diagnosis Date  ? ALLERGIC RHINITIS 08/11/2006  ? ASTHMA 08/11/2006  ? Bradycardia   ? Beta blocker d/c'd 2022  ? Chronic atrial fibrillation ([place]) 08/2014  ? Chronic combined systolic and diastolic CHF (congestive heart failure) ([place])   ? Cardiology f/u 12/2017: pt volume overloaded (R heart dysf suspected), BNP very high, lasix increased.  Repeat echo 12/2017: normal LV EF, mild DD, +RV syst dysfxn, mod pulm HTN, biatrial enlgmt.  ? Chronic constipation   ? Chronic renal insufficiency, stage 3 (moderate) (HCC)   ? Colon cancer screening 02/2021  ? 02/2021 Cologuard POS->GI ref  ? DIABETES MELLITUS, TYPE II 08/11/2006  ? HYPERTENSION 08/11/2006  ? Impaired mobility and endurance   ? MRSA infection 06/2018  ? LL venous stasis ulcer infected  ? Normocytic anemia 2016-2019  ? 03/2018 B12 normal, iron ok (ferritin borderline low).  ? OBESITY, MORBID 12/14/2007  ? saxenda started 05/2020 by [facility] wt mgmt center  ? OSA (obstructive sleep apnea)   ? to get sleep study with [facility]) as of 12/03/2018 consult.  ? 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 06/2017 Clindamycin suppression (hx of MRSA) caused diarrhea.  [id number] ID started him on amoxil prophylaxis---ineffective.  End 2018/Jan 2019 penicillin G injections prophyl helpful but pt declined to continue this as of 01/2018 ID f/u.  ID talked him into resuming monthly penicillin G as of  02/2018 f/u.    ? Restless leg syndrome   ? Rx'd clonazepam 09/2017 and pt refused to take it after reading the medication's potential side effects.  ? Venous stasis ulcers of both lower extremities ([place])   ? Severe lymphedema.  wound clinic care ongoing as of 01/2018  ? ? ?Past Surgical History:  ?Procedure Laterality Date  ? Carotid dopplers  07/23/2018  ? Left NORMAL.  Right 1-39% ICA stenosis, with <50% distal CCA stenosis (not hemodynamically significant)  ? RIGHT HEART CATH N/A 02/19/2022  ? Procedure: RIGHT HEART CATH;  Surgeon: Jolaine Artist, MD;  Location: [place] [facility];  Service: Cardiovascular;  Laterality: N/A;  ? TRANSTHORACIC ECHOCARDIOGRAM  11/2007; 09/2014; 11/2015;12/2017  ? LV fxn normal, EF normal, mild dilation of left atrium.  2015 grade II diast dysfxn.  2017 EF 55-60%. 2019: LVEF 60-65%, mild RV syst dysf,biatrial enlgmt, mod pulm htn.  ? URETERAL STENT PLACEMENT    ? virtual colonoscopy  01/2011  ? Normal  ? ? ?Family History  ?Problem Relation Age of Onset  ? Diabetes Mother   ? Diabetes Sister   ? Hydrocephalus Sister   ?     NPH  ? ? ?Social History:  reports that he has never smoked. He has never been exposed to tobacco smoke. He has never used smokeless tobacco. He reports that he does not drink alcohol and does not use drugs. ? ?Allergies:  ?Allergies  ?Allergen Reactions  ? Other Other (See Comments)  ?  Sneezing, coughing  ? Hydralazine Other (See Comments)  ?  Drowsiness/sedation/mental fog  ? ? ?Medications: I have reviewed the patient's current medications. ? ?Results for orders placed or performed during the hospital encounter of 02/13/22 (from the past 48 hour(s))  ?Glucose, capillary     Status: Abnormal  ? Collection Time: 03/04/22 11:06 AM  ?Result Value Ref Range  ? Glucose-Capillary 192 (H) 70 - 99 mg/dL  ?  Comment: Glucose reference range applies only to samples taken after fasting for at least 8 hours.  ?Glucose, capillary     Status: Abnormal  ? Collection Time:  03/04/22  4:14 PM  ?Result Value Ref Range  ? Glucose-Capillary 183 (H) 70 - 99 mg/dL  ?  Comment: Glucose reference range applies only to samples taken after fasting for at least 8 hours.  ?Glucose, capillary     Status: Abnormal  ? Collection Time: 03/04/22  9:13 PM  ?Result Value Ref Range  ? Glucose-Capillary 171 (H) 70 - 99 mg/dL  ?  Comment: Glucose reference range applies only to samples taken after fasting for at least 8 hours.  ?CBC     Status: Abnormal  ? Collection Time: 03/05/22  5:40 AM  ?Result Value Ref Range  ? WBC 10.1 4.0 - 10.5 K/uL  ? RBC 4.44 4.22 - 5.81 MIL/uL  ? Hemoglobin 10.2 (L) 13.0 - 17.0 g/dL  ? HCT 31.8 (L) 39.0 - 52.0 %  ? MCV 71.6 (L) 80.0 - 100.0 fL  ? MCH 23.0 (L) 26.0 - 34.0 pg  ? MCHC 32.1 30.0 - 36.0 g/dL  ? RDW 19.6 (H) 11.5 - 15.5 %  ? Platelets 437 (H) 150 - 400 K/uL  ? nRBC 0.0 0.0 - 0.2 %  ?  Comment: Performed at [facility], [place] [address], [place], [place] [address]  ?Basic metabolic panel     Status: Abnormal  ? Collection Time: 03/05/22  5:40 AM  ?Result Value Ref Range  ? Sodium 135 135 - 145 mmol/L  ? Potassium 3.7 3.5 - 5.1 mmol/L  ? Chloride 97 (L) 98 - 111 mmol/L  ? CO2 29 22 - 32 mmol/L  ? Glucose, Bld 130 (H) 70 - 99 mg/dL  ?  Comment: Glucose reference range applies only to samples taken after fasting for at least 8 hours.  ? BUN 76 (H) 8 - 23 mg/dL  ? Creatinine, Ser 2.01 (H) 0.61 - 1.24 mg/dL  ? Calcium 8.5 (L) 8.9 - 10.3 mg/dL  ? GFR, Estimated 35 (L) >60 mL/min  ?  Comment: (NOTE) ?Calculated using the CKD-EPI Creatinine Equation (2021) ?  ? Anion gap 9 5 - 15  ?  Comment: Performed at [facility], [place] [address] [place], [place] [address]  ?Glucose, capillary     Status: Abnormal  ? Collection Time: 03/05/22  6:16 AM  ?Result Value Ref Range  ? Glucose-Capillary 141 (H) 70 - 99 mg/dL  ?  Comment: Glucose reference range applies only to samples taken after fasting for at least 8 hours.  ? Comment 1 Notify RN   ? Comment 2 Document in Chart    ?Glucose, capillary     Status: Abnormal  ? Collection Time: 03/05/22 11:29 AM  ?Result Value Ref Range  ? Glucose-Capillary 136 (H) 70 - 99 mg/dL  ?  Comment: Glucose reference range applies only to samples taken after fasting for at least 8 hours.  ?Glucose, capillary     Status: Abnormal  ?  Collection Time: 03/05/22  4:20 PM  ?Result Value Ref Range  ? Glucose-Capillary 200 (H) 70 - 99 mg/dL  ?  Comment: Glucose reference range applies only to samples taken after fasting for at least 8 hours.  ?Glucose, capillary     Status: Abnormal  ? Collection Time: 03/05/22  9:23 PM  ?Result Value Ref Range  ? Glucose-Capillary 163 (H) 70 - 99 mg/dL  ?  Comment: Glucose reference range applies only to samples taken after fasting for at least 8 hours.  ?Basic metabolic panel     Status: Abnormal  ? Collection Time: 03/06/22  6:10 AM  ?Result Value Ref Range  ? Sodium 138 135 - 145 mmol/L  ? Potassium 3.7 3.5 - 5.1 mmol/L  ? Chloride 100 98 - 111 mmol/L  ? CO2 28 22 - 32 mmol/L  ? Glucose, Bld 155 (H) 70 - 99 mg/dL  ?  Comment: Glucose reference range applies only to samples taken after fasting for at least 8 hours.  ? BUN 67 (H) 8 - 23 mg/dL  ? Creatinine, Ser 1.90 (H) 0.61 - 1.24 mg/dL  ? Calcium 8.6 (L) 8.9 - 10.3 mg/dL  ? GFR, Estimated 37 (L) >60 mL/min  ?  Comment: (NOTE) ?Calculated using the CKD-EPI Creatinine Equation (2021) ?  ? Anion gap 10 5 - 15  ?  Comment: Performed at [facility], [place] [address] [place], [place] [address]  ?Glucose, capillary     Status: Abnormal  ? Collection Time: 03/06/22  6:27 AM  ?Result Value Ref Range  ? Glucose-Capillary 155 (H) 70 - 99 mg/dL  ?  Comment: Glucose reference range applies only to samples taken after fasting for at least 8 hours.  ? ? ?DG KNEE 3 VIEW RIGHT ? ?Result Date: 03/05/2022 ?CLINICAL DATA:  Pain right knee EXAM: RIGHT KNEE - 3 VIEW COMPARISON:  None Available. FINDINGS: No recent fracture or dislocation is seen. Degenerative changes are noted with joint  space narrowing and bony spurs in the medial, lateral and patellofemoral compartments. Arterial calcifications are seen in the soft tissues. There is large smooth marginated calcific density in the upper margin of

## 2022-03-06 NOTE — Progress Notes (Signed)
Report given to receiving facility RN.

## 2022-03-07 DIAGNOSIS — I2781 Cor pulmonale (chronic): Secondary | ICD-10-CM | POA: Diagnosis not present

## 2022-03-07 DIAGNOSIS — Z7401 Bed confinement status: Secondary | ICD-10-CM | POA: Diagnosis not present

## 2022-03-07 DIAGNOSIS — R2689 Other abnormalities of gait and mobility: Secondary | ICD-10-CM | POA: Diagnosis not present

## 2022-03-07 DIAGNOSIS — N390 Urinary tract infection, site not specified: Secondary | ICD-10-CM | POA: Diagnosis not present

## 2022-03-07 DIAGNOSIS — I509 Heart failure, unspecified: Secondary | ICD-10-CM | POA: Diagnosis not present

## 2022-03-07 DIAGNOSIS — Y92009 Unspecified place in unspecified non-institutional (private) residence as the place of occurrence of the external cause: Secondary | ICD-10-CM | POA: Diagnosis not present

## 2022-03-07 DIAGNOSIS — I5033 Acute on chronic diastolic (congestive) heart failure: Secondary | ICD-10-CM | POA: Diagnosis not present

## 2022-03-07 DIAGNOSIS — M170 Bilateral primary osteoarthritis of knee: Secondary | ICD-10-CM | POA: Diagnosis not present

## 2022-03-07 DIAGNOSIS — M10061 Idiopathic gout, right knee: Secondary | ICD-10-CM | POA: Diagnosis not present

## 2022-03-07 DIAGNOSIS — M6281 Muscle weakness (generalized): Secondary | ICD-10-CM | POA: Diagnosis not present

## 2022-03-07 DIAGNOSIS — E1165 Type 2 diabetes mellitus with hyperglycemia: Secondary | ICD-10-CM | POA: Diagnosis not present

## 2022-03-07 DIAGNOSIS — R7989 Other specified abnormal findings of blood chemistry: Secondary | ICD-10-CM | POA: Diagnosis not present

## 2022-03-07 DIAGNOSIS — G4733 Obstructive sleep apnea (adult) (pediatric): Secondary | ICD-10-CM | POA: Diagnosis not present

## 2022-03-07 DIAGNOSIS — M25561 Pain in right knee: Secondary | ICD-10-CM | POA: Diagnosis not present

## 2022-03-07 DIAGNOSIS — E861 Hypovolemia: Secondary | ICD-10-CM | POA: Diagnosis not present

## 2022-03-07 DIAGNOSIS — L03116 Cellulitis of left lower limb: Secondary | ICD-10-CM | POA: Diagnosis not present

## 2022-03-07 DIAGNOSIS — I5082 Biventricular heart failure: Secondary | ICD-10-CM | POA: Diagnosis not present

## 2022-03-07 DIAGNOSIS — T502X5A Adverse effect of carbonic-anhydrase inhibitors, benzothiadiazides and other diuretics, initial encounter: Secondary | ICD-10-CM | POA: Diagnosis not present

## 2022-03-07 DIAGNOSIS — Z794 Long term (current) use of insulin: Secondary | ICD-10-CM | POA: Diagnosis not present

## 2022-03-07 DIAGNOSIS — I2729 Other secondary pulmonary hypertension: Secondary | ICD-10-CM | POA: Diagnosis not present

## 2022-03-07 DIAGNOSIS — Z833 Family history of diabetes mellitus: Secondary | ICD-10-CM | POA: Diagnosis not present

## 2022-03-07 DIAGNOSIS — Z743 Need for continuous supervision: Secondary | ICD-10-CM | POA: Diagnosis not present

## 2022-03-07 DIAGNOSIS — M109 Gout, unspecified: Secondary | ICD-10-CM | POA: Diagnosis not present

## 2022-03-07 DIAGNOSIS — N183 Chronic kidney disease, stage 3 unspecified: Secondary | ICD-10-CM | POA: Diagnosis not present

## 2022-03-07 DIAGNOSIS — N1832 Chronic kidney disease, stage 3b: Secondary | ICD-10-CM | POA: Diagnosis not present

## 2022-03-07 DIAGNOSIS — Z5181 Encounter for therapeutic drug level monitoring: Secondary | ICD-10-CM | POA: Diagnosis not present

## 2022-03-07 DIAGNOSIS — M2550 Pain in unspecified joint: Secondary | ICD-10-CM | POA: Diagnosis not present

## 2022-03-07 DIAGNOSIS — I5042 Chronic combined systolic (congestive) and diastolic (congestive) heart failure: Secondary | ICD-10-CM | POA: Diagnosis not present

## 2022-03-07 DIAGNOSIS — D6859 Other primary thrombophilia: Secondary | ICD-10-CM | POA: Diagnosis not present

## 2022-03-07 DIAGNOSIS — R531 Weakness: Secondary | ICD-10-CM | POA: Diagnosis not present

## 2022-03-07 DIAGNOSIS — I4891 Unspecified atrial fibrillation: Secondary | ICD-10-CM | POA: Diagnosis not present

## 2022-03-07 DIAGNOSIS — Z741 Need for assistance with personal care: Secondary | ICD-10-CM | POA: Diagnosis not present

## 2022-03-07 DIAGNOSIS — I890 Lymphedema, not elsewhere classified: Secondary | ICD-10-CM | POA: Diagnosis not present

## 2022-03-07 DIAGNOSIS — I4821 Permanent atrial fibrillation: Secondary | ICD-10-CM | POA: Diagnosis not present

## 2022-03-07 DIAGNOSIS — E1122 Type 2 diabetes mellitus with diabetic chronic kidney disease: Secondary | ICD-10-CM | POA: Diagnosis not present

## 2022-03-07 DIAGNOSIS — I5032 Chronic diastolic (congestive) heart failure: Secondary | ICD-10-CM | POA: Diagnosis not present

## 2022-03-07 DIAGNOSIS — E871 Hypo-osmolality and hyponatremia: Secondary | ICD-10-CM | POA: Diagnosis not present

## 2022-03-07 DIAGNOSIS — L03115 Cellulitis of right lower limb: Secondary | ICD-10-CM | POA: Diagnosis not present

## 2022-03-07 DIAGNOSIS — I10 Essential (primary) hypertension: Secondary | ICD-10-CM | POA: Diagnosis not present

## 2022-03-07 DIAGNOSIS — Z7901 Long term (current) use of anticoagulants: Secondary | ICD-10-CM | POA: Diagnosis not present

## 2022-03-07 DIAGNOSIS — M1711 Unilateral primary osteoarthritis, right knee: Secondary | ICD-10-CM | POA: Diagnosis not present

## 2022-03-07 DIAGNOSIS — G2581 Restless legs syndrome: Secondary | ICD-10-CM | POA: Diagnosis not present

## 2022-03-07 DIAGNOSIS — N179 Acute kidney failure, unspecified: Secondary | ICD-10-CM | POA: Diagnosis not present

## 2022-03-07 DIAGNOSIS — I272 Pulmonary hypertension, unspecified: Secondary | ICD-10-CM | POA: Diagnosis not present

## 2022-03-07 DIAGNOSIS — I5043 Acute on chronic combined systolic (congestive) and diastolic (congestive) heart failure: Secondary | ICD-10-CM | POA: Diagnosis not present

## 2022-03-07 DIAGNOSIS — M25562 Pain in left knee: Secondary | ICD-10-CM | POA: Diagnosis not present

## 2022-03-07 DIAGNOSIS — Z79899 Other long term (current) drug therapy: Secondary | ICD-10-CM | POA: Diagnosis not present

## 2022-03-07 DIAGNOSIS — D509 Iron deficiency anemia, unspecified: Secondary | ICD-10-CM | POA: Diagnosis not present

## 2022-03-07 DIAGNOSIS — I4819 Other persistent atrial fibrillation: Secondary | ICD-10-CM | POA: Diagnosis not present

## 2022-03-07 DIAGNOSIS — L89142 Pressure ulcer of left lower back, stage 2: Secondary | ICD-10-CM | POA: Diagnosis not present

## 2022-03-07 DIAGNOSIS — D631 Anemia in chronic kidney disease: Secondary | ICD-10-CM | POA: Diagnosis not present

## 2022-03-07 DIAGNOSIS — N19 Unspecified kidney failure: Secondary | ICD-10-CM | POA: Diagnosis not present

## 2022-03-07 DIAGNOSIS — L8990 Pressure ulcer of unspecified site, unspecified stage: Secondary | ICD-10-CM | POA: Insufficient documentation

## 2022-03-07 DIAGNOSIS — I4811 Longstanding persistent atrial fibrillation: Secondary | ICD-10-CM | POA: Diagnosis not present

## 2022-03-07 DIAGNOSIS — E1140 Type 2 diabetes mellitus with diabetic neuropathy, unspecified: Secondary | ICD-10-CM | POA: Diagnosis not present

## 2022-03-07 DIAGNOSIS — Z6841 Body Mass Index (BMI) 40.0 and over, adult: Secondary | ICD-10-CM | POA: Diagnosis not present

## 2022-03-07 DIAGNOSIS — I130 Hypertensive heart and chronic kidney disease with heart failure and stage 1 through stage 4 chronic kidney disease, or unspecified chronic kidney disease: Secondary | ICD-10-CM | POA: Diagnosis not present

## 2022-03-07 DIAGNOSIS — R739 Hyperglycemia, unspecified: Secondary | ICD-10-CM | POA: Diagnosis not present

## 2022-03-07 DIAGNOSIS — R5381 Other malaise: Secondary | ICD-10-CM | POA: Diagnosis not present

## 2022-03-07 DIAGNOSIS — M19012 Primary osteoarthritis, left shoulder: Secondary | ICD-10-CM | POA: Diagnosis not present

## 2022-03-07 DIAGNOSIS — N1831 Chronic kidney disease, stage 3a: Secondary | ICD-10-CM | POA: Diagnosis not present

## 2022-03-07 DIAGNOSIS — E1121 Type 2 diabetes mellitus with diabetic nephropathy: Secondary | ICD-10-CM | POA: Diagnosis not present

## 2022-03-07 DIAGNOSIS — Z8614 Personal history of Methicillin resistant Staphylococcus aureus infection: Secondary | ICD-10-CM | POA: Diagnosis not present

## 2022-03-07 DIAGNOSIS — I50812 Chronic right heart failure: Secondary | ICD-10-CM | POA: Diagnosis not present

## 2022-03-07 DIAGNOSIS — Z888 Allergy status to other drugs, medicaments and biological substances status: Secondary | ICD-10-CM | POA: Diagnosis not present

## 2022-03-07 LAB — GLUCOSE, CAPILLARY
Glucose-Capillary: 248 mg/dL — ABNORMAL HIGH (ref 70–99)
Glucose-Capillary: 227 mg/dL — ABNORMAL HIGH (ref 70–99)

## 2022-03-07 NOTE — Progress Notes (Signed)
Mobility Specialist Progress Note: ? ? 03/07/22 1000  ?Mobility  ?Activity  ?(bed level exercises)  ?Range of Motion/Exercises Active;Right leg;Left leg;Passive  ?Assistive Device None  ?Activity Response Tolerated well  ?$Mobility charge 1 Mobility  ? ?Pt agreeable to mobility session. Performed multiple bed level exercises, tolerated well. Pt left with all needs met.  ? ?Nelta Numbers ?Acute Rehab ?Phone: [phone] ?Office Phone: [phone] ? ?

## 2022-03-07 NOTE — Social Work (Addendum)
Pt was set to DC to [facility] yesterday but facility would not accept bc it was a 4am pick up. Pt will DC today. ?Pt requires a new auth/ CSW started auth [id number]  ?

## 2022-03-07 NOTE — Discharge Summary (Signed)
Physician Discharge Summary  ?[name] [id number] DOB: 1951-07-18 DOA: 02/13/2022 ? ?PCP: Tammi Sou, MD ? ?Admit date: 02/13/2022 ?Discharge date: 03/07/2022 ? ?The patient was seen and examined this morning, stable, finding about the knee x-ray was discussed with the patient in detail  ?Cleared to discharge to SNF ? ?time spent 45 minutes ? ?Recommendations for Outpatient Follow-up:  ?Cardiology Dr. Debara Pickett in 1 to 2 weeks ?CHF TOC clinic in 1 week ?Please check BMP in 4 to 5 days ? ? ?Discharge Diagnoses:  ?Principal Problem: ?  Acute on chronic diastolic CHF (congestive heart failure) ([place]) ?Morbid obesity ?Very poor functional status ?Chronic leg wounds and lymphedema ?Bilateral osteoarthritis ?  Atrial fibrillation ([place]) ?  Acute kidney injury superimposed on chronic kidney disease ([place]) ?  DM2 (diabetes mellitus, type 2) ([place]) ?  Hypertension, essential ?  Lymphedema of both lower extremities ?  Class 3 obesity (HCC) ?DNR ? ? ?Discharge Condition: Stable ? ?Diet recommendation: Low-sodium, diabetic ? ?Filed Weights  ? 03/05/22 0500 03/06/22 0500 03/07/22 0403  ?Weight: (!) 153.3 kg (!) 151.6 kg (!) 149 kg  ? ? ?History of present illness:  ?[name] morbidly obese, BMI of 56, history of type 2 diabetes mellitus, chronic diastolic CHF, paroxysmal atrial fibrillation, hypertension presented to the ED with dyspnea and progressive weight gain of 25lbs over 4 weeks. ?Admitted with significant volume overload, ?-Diuresed with IV Lasix and treated for UTI as well ?-4/18 was transferred to [facility] from [facility] for cardiac work-up, right heart cath 4/18 noted marked volume overload, predominantly right heart failure, preserved cardiac output ?-4/19 started Lasix drip and metolazone ?-Cards following, now on Lasix 80 Mg twice daily, weight down to 74lbs ?  ? ?Hospital Course:  ? ? Acute on chronic diastolic CHF (congestive heart failure) ([place]) ?Echo with EF 55 to 60% with moderate LVH, not well visualized  RV ?-Right heart cath 4/18 noted markedly elevated filling pressures, R>L heart failure, preserved cardiac output,  ?--ABG surprisingly without hypercarbia ?-Diuresed on Lasix gtt. and high-dose IV Lasix, he is 25 L negative, weight down to 78 pounds ?-Creatinine stable at 2.0 ?-Followed by cardiology this admission, changed to oral torsemide 40 Mg daily ?-Discharge to SNF for short-term rehab ?-Close follow-up in heart failure, TOC clinic and Dr. Debara Pickett ?  ?Right knee pain, limited mobility ?-suspect severe OA ?-Chronic knee pain, worse in the last few weeks, seen by orthopedics in consultation, suspected to have severe osteoarthritis based on x-ray, right knee aspiration and injection of Marcaine and Depo-Medrol instilled ?-Ideally needs both knees replaced but too chronically ill for anesthesia ?-Follow-up with orthopedics in 1 to 2 months ?  ?Atrial fibrillation ([place]) ?-Remains in A-fib, heart rate controlled on Cardizem and Xarelto ?  ?Acute kidney injury superimposed on chronic kidney disease ([place]) ?CKD stage 3b. Hyponatremia. Hypokalemia ?-Baseline creatinine around 1.6-1.8, creatinine had bumped up to 2.3 ?-Cardiorenal syndrome, now improving, creatinine 2.0 range at discharge ?  ?Hypertension, essential ?-Stable, continue Imdur ?  ?DM2 (diabetes mellitus, type 2) ([place]) ?-CBG stable now continue current dose of insulin 70/30 ?  ?Lymphedema of both lower extremities ?Chronic wounds ?-Worsened in the setting of right heart failure ?-Completed 5 days of IV ceftriaxone for UTI and possible wound infection ?-Continue topical antifungal and local wound care, gets dressing changes every Wednesday ?  ?UTI ?-Completed ABX therapy ?  ?Morbid obesity ?Calculated BMI is 56.3.  ?  ?Code Status: DNR ? ?Procedures: ?5/3, right knee aspiration and injection of Marcaine  and 40 mg Depo-Medrol ? ?Consultations: ?Cardiology, orthopedics ? ?Discharge Exam: ?Vitals:  ? 03/07/22 0403 03/07/22 0842  ?BP: (!) 142/85 (!) 125/100   ?Pulse: 75   ?Resp: (!) 24 18  ?Temp: 97.8 ?F (36.6 ?C)   ?SpO2: 93%   ? ?General exam: Morbidly obese chronically ill male sitting up in bed, AAOx3, no distress ?HEENT: Neck obese unable to assess JVD ?CVS: S1-S2, irregularly irregular rhythm ?Lungs: Poor air movement bilaterally otherwise clear ?Abdomen: Soft, obese, nontender, abdominal wall edema is improving  ?Extremities: Right knee swelling and tenderness, 1+ edema, bilateral lower leg wounds, excoriations, Unna boots on  ?Psychiatry:  Mood & affect appropriate.  ?Discharge Instructions ? ? ?Discharge Instructions   ? ? Diet - low sodium heart healthy   Complete by: As directed ?  ? Diet Carb Modified   Complete by: As directed ?  ? Discharge wound care:   Complete by: As directed ?  ? Routine wound care, 4 layers of wrap on both lower extremity changed weekly, last changed 5/3  ? Increase activity slowly   Complete by: As directed ?  ? ?  ? ?Allergies as of 03/07/2022   ? ?   Reactions  ? Other Other (See Comments)  ? Sneezing, coughing  ? Hydralazine Other (See Comments)  ? Drowsiness/sedation/mental fog  ? ?  ? ?  ?Medication List  ?  ? ?STOP taking these medications   ? ?amLODipine 10 MG tablet ?Commonly known as: NORVASC ?  ?butalbital-apap-caffeine-codeine 50-325-40-30 MG capsule ?Commonly known as: FIORICET WITH CODEINE ?  ?furosemide 40 MG tablet ?Commonly known as: LASIX ?  ?Insulin Lispro Prot & Lispro (75-25) 100 UNIT/ML Kwikpen ?Commonly known as: HumaLOG Mix 75/25 KwikPen ?  ?metFORMIN 1000 MG tablet ?Commonly known as: GLUCOPHAGE ?  ?valsartan 320 MG tablet ?Commonly known as: DIOVAN ?  ? ?  ? ?TAKE these medications   ? ?b complex vitamins capsule ?Take 1 capsule by mouth every morning. ?  ?B-D ULTRAFINE III SHORT PEN 31G X 8 MM Misc ?Generic drug: Insulin Pen Needle ?USE 1 PENNEEDLE TWICE DAILY ?  ?cetirizine 10 MG tablet ?Commonly known as: ZYRTEC ?Take 10 mg by mouth at bedtime. ?  ?CHROMIUM GTF PO ?Take 1 tablet by mouth daily. ?  ?Co Q 10  10 MG Caps ?Take 400 mg by mouth daily. Reported on 02/19/2016 ?  ?diltiazem 120 MG 24 hr capsule ?Commonly known as: Tiazac ?Take 1 capsule (120 mg total) by mouth daily. ?  ?HYDROcodone-acetaminophen 5-325 MG tablet ?Commonly known as: NORCO/VICODIN ?Take 1-2 tablets by mouth every 6 (six) hours as needed for moderate pain. 1-2 tabs po bid prn pain ?What changed:  ?how much to take ?how to take this ?when to take this ?reasons to take this ?  ?insulin aspart protamine- aspart (70-30) 100 UNIT/ML injection ?Commonly known as: NOVOLOG MIX 70/30 ?Inject 0.3 mLs (30 Units total) into the skin 2 (two) times daily with a meal. ?  ?isosorbide mononitrate 30 MG 24 hr tablet ?Commonly known as: IMDUR ?Take 0.5 tablets (15 mg total) by mouth daily. ?What changed: how much to take ?  ?multivitamin tablet ?Take 1 tablet by mouth daily. ?  ?OneTouch Ultra test strip ?Generic drug: glucose blood ?CHECK BLOOD SUGAR TWICE  DAILY ?  ?rivaroxaban 20 MG Tabs tablet ?Commonly known as: Xarelto ?TAKE 1 TABLET BY MOUTH ONCE DAILY WITH SUPPER ?What changed:  ?how much to take ?when to take this ?additional instructions ?  ?senna-docusate 8.6-50 MG  tablet ?Commonly known as: Senokot-S ?Take 1 tablet by mouth at bedtime. ?  ?terazosin 10 MG capsule ?Commonly known as: HYTRIN ?TAKE 1 CAPSULE BY MOUTH  ONCE DAILY AT BEDTIME ?  ?Torsemide 40 MG Tabs ?Take 40 mg by mouth daily. ?  ?vitamin E 180 MG (400 UNITS) capsule ?Take 400 Units by mouth every morning. Vitamin E with Selenium ?  ? ?  ? ?  ?  ? ? ?  ?Discharge Care Instructions  ?(From admission, onward)  ?  ? ? ?  ? ?  Start     Ordered  ? 03/06/22 0000  Discharge wound care:       ?Comments: Routine wound care, 4 layers of wrap on both lower extremity changed weekly, last changed 5/3  ? 03/06/22 1355  ? ?  ?  ? ?  ? ?Allergies  ?Allergen Reactions  ? Other Other (See Comments)  ?  Sneezing, coughing  ? Hydralazine Other (See Comments)  ?  Drowsiness/sedation/mental fog  ? ? Follow-up  Information   ? ? [facility]. Go on 03/12/2022.   ?Specialty: Cardiology ?Why: at [facility]  call the above number for pass number to park ?Hospital follow up  ?PLEASE bring lis

## 2022-03-07 NOTE — Progress Notes (Signed)
? ? ? ?  Subjective: ? ?Patient reports right knee pain mildly improved following aspiration and steroid injection yesterday.  He denies any pain in other joints or extremities at this time.  No new issues. ? ?Objective:  ? ?VITALS:   ?Vitals:  ? 03/06/22 0816 03/06/22 1120 03/06/22 2011 03/07/22 0403  ?BP: 140/73 139/67 (!) 144/65 (!) 142/85  ?Pulse: 79 72 73 75  ?Resp:  20 (!) 22 (!) 24  ?Temp:  98 ?F (36.7 ?C) 98 ?F (36.7 ?C) 97.8 ?F (36.6 ?C)  ?TempSrc:  Oral Oral Oral  ?SpO2:  94% 93% 93%  ?Weight:    (!) 149 kg  ?Height:      ? ? ?Mild swelling about the right knee.  No erythema.  Skin intact.  Tolerates gentle range of motion.  Tender along the medial aspect of the knee. ? ?Lab Results  ?Component Value Date  ? WBC 10.1 03/05/2022  ? HGB 10.2 (L) 03/05/2022  ? HCT 31.8 (L) 03/05/2022  ? MCV 71.6 (L) 03/05/2022  ? PLT 437 (H) 03/05/2022  ? ?BMET ?   ?Component Value Date/Time  ? NA 138 03/06/2022 0610  ? NA 141 02/09/2020 1453  ? K 3.7 03/06/2022 0610  ? CL 100 03/06/2022 0610  ? CO2 28 03/06/2022 0610  ? GLUCOSE 155 (H) 03/06/2022 0610  ? BUN 67 (H) 03/06/2022 0610  ? BUN 20 02/09/2020 1453  ? CREATININE 1.90 (H) 03/06/2022 0610  ? CREATININE 1.57 (H) 04/04/2021 0844  ? CALCIUM 8.6 (L) 03/06/2022 0610  ? GFRNONAA 37 (L) 03/06/2022 0610  ? ? ? ? ?Xray: Right knee x-rays demonstrate severe tricompartmental osteoarthritis.  No acute fracture or dislocation. ? ?Assessment/Plan: ?16 Days Post-Op  ? ?Principal Problem: ?  Acute on chronic diastolic CHF (congestive heart failure) (HCC) ?Active Problems: ?  DM2 (diabetes mellitus, type 2) (HCC) ?  Hypertension, essential ?  Lymphedema of both lower extremities ?  Atrial fibrillation (HCC) ?  Acute kidney injury superimposed on chronic kidney disease (HCC) ?  Class 3 obesity (HCC) ?  Pressure injury of skin ? ?Right knee gout flare. ? ?Aspiration results from yesterday demonstrate monosodium crystals consistent with gout.  Anticipate good improvement with the steroid  injection.  Can also provide medication management for the gout.  Given the underlying arthritis he would likely continue to have some level of pain even once the gout flare has resolved.  Given his weight and health he is not a candidate for total knee arthroplasty at this time.  Low suspicion of infection at this time given low cell count, cultures with no growth to date.  Negative Gram stain.  Follow-up with orthopedics as needed. ? ? ? ? ?Syliva Mee A Lenix Benoist ?03/07/2022, 8:15 AM ? ? ?Weber Cooks, MD ? ?Contact information:   ?Weekdays 7am-5pm epic message Dr. Blanchie Dessert, or call office for patient follow up: [phone] ?After hours and holidays please check [email or web address] for group call information for [facility] ? ?  ?

## 2022-03-07 NOTE — Care Management Important Message (Signed)
Important Message ? ?Patient Details  ?Name: [name] ?MRN: [id number] ?Date of Birth: 1950/12/08 ? ? ?Medicare Important Message Given:  Yes ? ? ? ? ?[name] ?03/07/2022, 8:07 AM ?

## 2022-03-07 NOTE — Progress Notes (Signed)
[facility] came to pick up patient. Called SNF facility to make sure that they are accepting patient as this time.  Was able to verify after 3 calls and this RN was giving report to the receiving RN Thelma Barge however [facility] had left without the patient. Will call [facility] back to pick up patient.    ?

## 2022-03-07 NOTE — Progress Notes (Addendum)
Pt transported by [facility] to receiving facility. Pt's belongings with pt. ?

## 2022-03-07 NOTE — Progress Notes (Signed)
[facility] called and patient is back on the list to be transport.  ?

## 2022-03-09 LAB — BODY FLUID CULTURE W GRAM STAIN: Culture: NO GROWTH

## 2022-03-11 DIAGNOSIS — Z5181 Encounter for therapeutic drug level monitoring: Secondary | ICD-10-CM | POA: Diagnosis not present

## 2022-03-12 ENCOUNTER — Encounter (HOSPITAL_COMMUNITY): Payer: Medicare Other

## 2022-03-12 DIAGNOSIS — R5381 Other malaise: Secondary | ICD-10-CM | POA: Diagnosis not present

## 2022-03-12 DIAGNOSIS — I509 Heart failure, unspecified: Secondary | ICD-10-CM | POA: Diagnosis not present

## 2022-03-13 DIAGNOSIS — L89142 Pressure ulcer of left lower back, stage 2: Secondary | ICD-10-CM | POA: Diagnosis not present

## 2022-03-14 DIAGNOSIS — E1122 Type 2 diabetes mellitus with diabetic chronic kidney disease: Secondary | ICD-10-CM | POA: Diagnosis not present

## 2022-03-19 ENCOUNTER — Encounter (HOSPITAL_COMMUNITY): Payer: Medicare Other

## 2022-03-20 DIAGNOSIS — L89142 Pressure ulcer of left lower back, stage 2: Secondary | ICD-10-CM | POA: Diagnosis not present

## 2022-03-26 ENCOUNTER — Encounter (HOSPITAL_COMMUNITY): Payer: Medicare Other

## 2022-03-27 DIAGNOSIS — L89142 Pressure ulcer of left lower back, stage 2: Secondary | ICD-10-CM | POA: Diagnosis not present

## 2022-03-28 NOTE — Progress Notes (Addendum)
HEART & VASCULAR TRANSITION OF CARE CONSULT NOTE     Referring Physician: Dr. Flossie Dibble  Primary Care: Dr. Milinda Cave Primary Cardiologist: Dr. [name]  HPI: Referred to clinic by Dr. Flossie Dibble for heart failure consultation. 71 y.o. male with history of morbid obesity, DM II, chronic diastolic CHF, persistent AF HTN, lymphedema of both LE with chronic wounds. Follows with Dr. [name] from [facility].   Admitted 04/23 with a/c CHF. Reports he had been eating a lot of high-sodium meals while staying in a hotel waiting to move into a new apartment. Diuresed with IV lasix and treated with for UTI. Transferred from [facility] to [facility] for further cardiac workup. Echo during admit: EF 55-60%, RV not well visualized. RHC: RA 18, PA 63/19 (35), Fick CO/CI 10.5/3.8, Thermo CO/CI 7.2/2.6, PVR 1.9 WU (thermo), PAPi 2.4. Suspected pulmonary HTN likely d/t combination of left heart disease and primary pulmonary hypertension related to untreated OSA and obesity. 78 lb weight loss during admit. Discharged on 40 mg Torsemide daily. Discharge weight 333 lb.   Patient discharged to SNF in [place] for rehab. Reports his weight had been down to 316 lb there. He has since gained 20 lb, up to 336 lb yesterday. His Torsemide was increased to 60 mg daily 2 days ago at SNF (he was unaware this was increased). States his legs are more swollen. Denies dyspnea at rest.  No orthopnea or PND. Has very limited mobility from knee pain 2/2 osteoarthritis. States he was walking prior to admission but now knees hurt too much. Reports meals at SNF are high in sodium, items like fat back and pintos served. Fluids are not restricted, drink a lot of water. He is fearful of readmission.   Review of Systems: [y] = yes, [ ]  = no   General: Weight gain [Y]; Weight loss [ ] ; Anorexia [ ] ; Fatigue [ ] ; Fever [ ] ; Chills [ ] ; Weakness [ ]   Cardiac: Chest pain/pressure [ ] ; Resting SOB [ ] ; Exertional SOB [Y]; Orthopnea [ ] ; Pedal Edema [Y];  Palpitations [ ] ; Syncope [ ] ; Presyncope [ ] ; Paroxysmal nocturnal dyspnea[ ]   Pulmonary: Cough [ ] ; Wheezing[ ] ; Hemoptysis[ ] ; Sputum [ ] ; Snoring [ ]   GI: Vomiting[ ] ; Dysphagia[ ] ; Melena[ ] ; Hematochezia [ ] ; Heartburn[ ] ; Abdominal pain [ ] ; Constipation [ ] ; Diarrhea [ ] ; BRBPR [ ]   GU: Hematuria[ ] ; Dysuria [ ] ; Nocturia[ ]   Vascular: Pain in legs with walking [ ] ; Pain in feet with lying flat [ ] ; Non-healing sores [Y]; Stroke [ ] ; TIA [ ] ; Slurred speech [ ] ;  Neuro: Headaches[ ] ; Vertigo[ ] ; Seizures[ ] ; Paresthesias[ ] ;Blurred vision [ ] ; Diplopia [ ] ; Vision changes [ ]   Ortho/Skin: Arthritis [ ] ; Joint pain [ ] ; Muscle pain [ ] ; Joint swelling [ ] ; Back Pain [ ] ; Rash [ ]   Psych: Depression[ ] ; Anxiety[ ]   Heme: Bleeding problems [ ] ; Clotting disorders [ ] ; Anemia [ ]   Endocrine: Diabetes [ ] ; Thyroid dysfunction[ ]    Past Medical History:  Diagnosis Date   ALLERGIC RHINITIS 08/11/2006   ASTHMA 08/11/2006   Bradycardia    Beta blocker d/c'd 2022   Chronic atrial fibrillation (HCC) 08/2014   Chronic combined systolic and diastolic CHF (congestive heart failure) [facility])    Cardiology f/u 12/2017: pt volume overloaded (R heart dysf suspected), BNP very high, lasix increased.  Repeat echo 12/2017: normal LV EF, mild DD, +RV syst dysfxn, mod pulm HTN, biatrial enlgmt.   Chronic  constipation    Chronic renal insufficiency, stage 3 (moderate) (HCC)    Colon cancer screening 02/2021   02/2021 Cologuard POS->GI ref   DIABETES MELLITUS, TYPE II 08/11/2006   HYPERTENSION 08/11/2006   Impaired mobility and endurance    MRSA infection 06/2018   LL venous stasis ulcer infected   Normocytic anemia 2016-2019   03/2018 B12 normal, iron ok (ferritin borderline low).   OBESITY, MORBID 12/14/2007   saxenda started 05/2020 by [facility] wt mgmt center   OSA (obstructive sleep apnea)    to get sleep study with [name]) as of 12/03/2018 consult.   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  [id number] ID started him on amoxil prophylaxis---ineffective.  End 2018/Jan 2019 penicillin G injections prophyl helpful but pt declined to continue this as of 01/2018 ID f/u.  ID talked him into resuming monthly penicillin G as of 02/2018 f/u.     Restless leg syndrome    Rx'd clonazepam 09/2017 and pt refused to take it after reading the medication's potential side effects.   Venous stasis ulcers of both lower extremities (HCC)    Severe lymphedema.  wound clinic care ongoing as of 01/2018    Current Outpatient Medications  Medication Sig Dispense Refill   apixaban (ELIQUIS) 5 MG TABS tablet Take 5 mg by mouth 2 (two) times daily.     b complex vitamins capsule Take 1 capsule by mouth every morning.     cetirizine (ZYRTEC) 10 MG tablet Take 10 mg by mouth at bedtime.     diltiazem (TIAZAC) 120 MG 24 hr capsule Take 1 capsule (120 mg total) by mouth daily. 90 capsule 3   glucose blood (ONETOUCH ULTRA) test strip CHECK BLOOD SUGAR TWICE  DAILY 200 strip 0   HYDROcodone-acetaminophen (NORCO/VICODIN) 5-325 MG tablet Take 1-2 tablets by mouth every 6 (six) hours as needed for moderate pain. 1-2 tabs po bid prn pain 10 tablet 0   insulin aspart protamine- aspart (NOVOLOG MIX 70/30) (70-30) 100 UNIT/ML injection Inject 0.3 mLs (30 Units total) into the skin 2 (two) times daily with a meal. 10 mL 0   Insulin Pen Needle (B-D ULTRAFINE III SHORT PEN) 31G X 8 MM MISC USE 1 PENNEEDLE TWICE DAILY 200 each 1   isosorbide mononitrate (IMDUR) 30 MG 24 hr tablet Take 0.5 tablets (15 mg total) by mouth daily.  3   metolazone (ZAROXOLYN) 2.5 MG tablet Take 1 tablet (2.5 mg total) by mouth as directed. By the HF Clinic. 10 tablet 2   Multiple Vitamin (MULTIVITAMIN) tablet Take 1 tablet by mouth daily.     nystatin (MYCOSTATIN/NYSTOP) powder Apply 1 application. topically 3 (three) times daily as needed.     potassium chloride SA (KLOR-CON M20)  20 MEQ tablet Take 1 tablet (20 mEq total) by mouth daily. Take 1 extra tablet on the days you take Metolazone 45 tablet 2   senna-docusate (SENOKOT-S) 8.6-50 MG tablet Take 1 tablet by mouth at bedtime.     terazosin (HYTRIN) 10 MG capsule Take 10 mg by mouth at bedtime.     Torsemide 40 MG TABS Take 60 mg by mouth daily.     traMADol (ULTRAM) 50 MG tablet Take 50 mg by mouth daily at 6 (six) AM.     vitamin E 400 UNIT capsule Take 400 Units by mouth every morning. Vitamin E with Selenium     No current facility-administered medications for this  encounter.    Allergies  Allergen Reactions   Other Other (See Comments)    Sneezing, coughing   Hydralazine Other (See Comments)    Drowsiness/sedation/mental fog      Social History   Socioeconomic History   Marital status: Single    Spouse name: Not on file   Number of children: 0   Years of education: 14   Highest education level: Associate degree: occupational, Hotel manager, or vocational program  Occupational History   Occupation: retired  Tobacco Use   Smoking status: Never    Passive exposure: Never   Smokeless tobacco: Never  Vaping Use   Vaping Use: Never used  Substance and Sexual Activity   Alcohol use: No   Drug use: No   Sexual activity: Not Currently  Other Topics Concern   Not on file  Social History Narrative   Single, no children.   Lives in [place] since 1985 [place] from [place]).   Occupation: Counsellor for [employer].   No Tobacco, no alc, drugs.   Exercise: no    Diet: no      Social Determinants of Health   Financial Resource Strain: Medium Risk   Difficulty of Paying Living Expenses: Somewhat hard  Food Insecurity: Food Insecurity Present   Worried About Charity fundraiser in the Last Year: Sometimes true   Ran Out of Food in the Last Year: Sometimes true  Transportation Needs: No Transportation Needs   Lack of Transportation (Medical): No   Lack of Transportation (Non-Medical):  No  Physical Activity: Inactive   Days of Exercise per Week: 0 days   Minutes of Exercise per Session: 0 min  Stress: Stress Concern Present   Feeling of Stress : Very much  Social Connections: Moderately Integrated   Frequency of Communication with Friends and Family: Three times a week   Frequency of Social Gatherings with Friends and Family: Never   Attends Religious Services: 1 to 4 times per year   Active Member of [employer] or Organizations: Yes   Attends Archivist Meetings: 1 to 4 times per year   Marital Status: Never married  Human resources officer Violence: Not At Risk   Fear of Current or Ex-Partner: No   Emotionally Abused: No   Physically Abused: No   Sexually Abused: No      Family History  Problem Relation Age of Onset   Diabetes Mother    Diabetes Sister    Hydrocephalus Sister        NPH    Vitals:   03/29/22 1113  BP: 122/62  Pulse: 75  SpO2: 96%    PHYSICAL EXAM: General:  No distress. Arrived in wheelchair.  HEENT: normal Neck: supple. + JVP to ear. Carotids 2+ bilat; no bruits.  Cor: PMI nondisplaced. Irregular rate & rhythm. No rubs, gallops or murmurs. Lungs: diminished Abdomen: morbidly obese, soft, nontender, nondistended. No hepatosplenomegaly.  Extremities: no cyanosis, clubbing, rash, compression wraps on b/l LE, 2-3+ edema into thighs Neuro: alert & oriented x 3, cranial nerves grossly intact. moves all 4 extremities w/o difficulty. Affect pleasant.  ECG: Atril fibrillation with rate 84 bpm, RBBB   ASSESSMENT & PLAN: Acute on chronic diastolic CHF with RV failure -Echo 04/23: EF 55-60%, RV not well visualized.  -RHC 04/23: RA 18, PA 63/19 (35), Fick CO/CI 10.5/3.8, Thermo CO/CI 7.2/2.6, PVR 1.9 WU (thermo), PAPi 2.4. -Recent admit for a/c CHF in setting of indiscretions with sodium intake. Diuresed > 70 lb. -NYHA very difficult to  assess d/t functional impairment from OA. Suspect NYHA III. -Volume appears elevated. Has gained 20 lb  at SNF.  -Increase Torsemide to 80 mg daily. Take 2.5 mg metolazone X 1 tomorrow am. -Start 20 mEq KCL daily, take extra 20 mEq K with metolazone. -GDMT limited by CKD, recent Scr > 2  -No SGLT2i d/t morbid obesity and risk of UTI -Consuming high sodium diet and lots of fluids. Orders for provide to SNF for 2 g sodium and 48 oz fluid restriction. -Suspect significant OSA/OHS. Sleep study once discharged from SNF. -BMET/BNP today  Pulmonary hypertension -RHC as above -likely d/t combination of left heart disease and primary pulmonary hypertension related to untreated OSA and obesity -Eventually need sleep study once discharged from SNF  Longstanding persistent atrial fibrillation -Rate controlled on diltiazem 120 mg daily -Would likely be extremely difficult to restore SR -Anticoagulated with Eliquis 5 mg BID  CKD IIIb -Baseline Scr 1.6-1.8 -Cr up to 2.36 during recent admit, 1.90 on discharge -Labs today -Watch renal function with diuresis  DM II -Insulin dependent -A1c 8.4% in March 2023 -No SGLT2i with morbid obesity/risk UTI  HTN -BP at goal  Lymphedema with b/l LE wounds -Has followed with wound center previously. -Covered with abx for possible cellulitis during recent admit -Has on UNNA boots today   NYHA Very difficult to assess d/t functional impairment from OA. Suspect NYHA III. GDMT  Diuretic, Torsemide 80 mg daily + 1 dose metolazone BB, No, EF preserved Ace/ARB/ARNI, no CKD (can eventually consider if Scr stable) MRA, no CKD SGLT2i, no risk of UTI, obesity    Referred to HFSW (PCP, Medications, Transportation, ETOH Abuse, Drug Abuse, Insurance, Museum/gallery curator ): No Refer to Pharmacy: No Refer to Home Health: No Refer to Advanced Heart Failure Clinic: No  Refer to General Cardiology: No, already established with Dr. Debara Pickett  Follow up  [facility] clinic 1 week to assess volume status, Dr. Debara Pickett in 3-4 weeks

## 2022-03-29 ENCOUNTER — Encounter (HOSPITAL_COMMUNITY): Payer: Self-pay

## 2022-03-29 ENCOUNTER — Ambulatory Visit (HOSPITAL_COMMUNITY)
Admission: RE | Admit: 2022-03-29 | Discharge: 2022-03-29 | Disposition: A | Discharge status: Home / Self Care | Payer: Medicare Other | Source: Ambulatory Visit | Attending: Physician Assistant | Admitting: Physician Assistant

## 2022-03-29 ENCOUNTER — Telehealth (HOSPITAL_COMMUNITY): Payer: Self-pay | Admitting: *Deleted

## 2022-03-29 VITALS — BP 122/62 | HR 75

## 2022-03-29 DIAGNOSIS — I272 Pulmonary hypertension, unspecified: Secondary | ICD-10-CM | POA: Insufficient documentation

## 2022-03-29 DIAGNOSIS — Z794 Long term (current) use of insulin: Secondary | ICD-10-CM | POA: Diagnosis not present

## 2022-03-29 DIAGNOSIS — Z7901 Long term (current) use of anticoagulants: Secondary | ICD-10-CM | POA: Insufficient documentation

## 2022-03-29 DIAGNOSIS — I10 Essential (primary) hypertension: Secondary | ICD-10-CM

## 2022-03-29 DIAGNOSIS — I4811 Longstanding persistent atrial fibrillation: Secondary | ICD-10-CM | POA: Insufficient documentation

## 2022-03-29 DIAGNOSIS — I5033 Acute on chronic diastolic (congestive) heart failure: Secondary | ICD-10-CM | POA: Insufficient documentation

## 2022-03-29 DIAGNOSIS — N1832 Chronic kidney disease, stage 3b: Secondary | ICD-10-CM | POA: Insufficient documentation

## 2022-03-29 DIAGNOSIS — M25562 Pain in left knee: Secondary | ICD-10-CM | POA: Insufficient documentation

## 2022-03-29 DIAGNOSIS — I890 Lymphedema, not elsewhere classified: Secondary | ICD-10-CM | POA: Diagnosis not present

## 2022-03-29 DIAGNOSIS — Z79899 Other long term (current) drug therapy: Secondary | ICD-10-CM | POA: Insufficient documentation

## 2022-03-29 DIAGNOSIS — I130 Hypertensive heart and chronic kidney disease with heart failure and stage 1 through stage 4 chronic kidney disease, or unspecified chronic kidney disease: Secondary | ICD-10-CM | POA: Diagnosis not present

## 2022-03-29 DIAGNOSIS — M25561 Pain in right knee: Secondary | ICD-10-CM | POA: Diagnosis not present

## 2022-03-29 DIAGNOSIS — E1122 Type 2 diabetes mellitus with diabetic chronic kidney disease: Secondary | ICD-10-CM | POA: Diagnosis not present

## 2022-03-29 LAB — BASIC METABOLIC PANEL
Anion gap: 8 (ref 5–15)
BUN: 15 mg/dL (ref 8–23)
CO2: 28 mmol/L (ref 22–32)
Calcium: 8.1 mg/dL — ABNORMAL LOW (ref 8.9–10.3)
Chloride: 94 mmol/L — ABNORMAL LOW (ref 98–111)
Creatinine, Ser: 1.37 mg/dL — ABNORMAL HIGH (ref 0.61–1.24)
GFR, Estimated: 55 mL/min — ABNORMAL LOW (ref >60)
Glucose, Bld: 97 mg/dL (ref 70–99)
Potassium: 3.9 mmol/L (ref 3.5–5.1)
Sodium: 130 mmol/L — ABNORMAL LOW (ref 135–145)

## 2022-03-29 LAB — BRAIN NATRIURETIC PEPTIDE: B Natriuretic Peptide: 306 pg/mL — ABNORMAL HIGH (ref 0.0–100.0)

## 2022-03-29 MED ORDER — METOLAZONE 2.5 MG PO TABS: 2.5000 mg | ORAL_TABLET | ORAL | 2 refills | Status: DC

## 2022-03-29 MED ORDER — POTASSIUM CHLORIDE CRYS ER 20 MEQ PO TBCR: 20.0000 meq | EXTENDED_RELEASE_TABLET | Freq: Every day | ORAL | 2 refills | Status: DC

## 2022-03-29 NOTE — Telephone Encounter (Signed)
Called to confirm Heart & Vascular Transitions of Care appointment at 11 am on 03/29/22. Patient reminded to bring all medications and pill box organizer with them. Confirmed patient has transportation. Gave directions, instructed to utilize valet parking.  Confirmed appointment prior to ending call.    Rhae Hammock, BSN, Scientist, clinical (histocompatibility and immunogenetics) Only

## 2022-03-29 NOTE — Patient Instructions (Addendum)
Labs done today. We will contact you only if your labs are abnormal.  INCREASE Torsemide to 80mg  (2 tablets) by mouth daily.  START Metolazone 2.5mg  (1 tablet) by mouth as directed by the HF Clinic. Take 1 tablet tomorrow morning.   START Potassium 31meq(1 tablet) by mouth daily. Take an extra tablet on the days you take Metolazone.   No other medication changes were made. Please continue all current medications as prescribed.  Your physician recommends that you schedule a follow-up appointment in: 1 week for an appointment with our office and in 3-4 weeks with Dr. Rod Mae office.   If you have any questions or concerns before your next appointment please send [place] a message through [facility] or call our office at [phone].    TO LEAVE A MESSAGE FOR THE NURSE SELECT OPTION 2, PLEASE LEAVE A MESSAGE INCLUDING: YOUR NAME DATE OF BIRTH CALL BACK NUMBER REASON FOR CALL**this is important as we prioritize the call backs  YOU WILL RECEIVE A CALL BACK THE SAME DAY AS LONG AS YOU CALL BEFORE 4:00 PM   Do the following things EVERYDAY: Weigh yourself in the morning before breakfast. Write it down and keep it in a log. Take your medicines as prescribed Eat low salt foods--Limit salt (sodium) to 2000 mg per day.  Stay as active as you can everyday Limit all fluids for the day to less than 2 liters   At the [facility], you and your health needs are our priority. As part of our continuing mission to provide you with exceptional heart care, we have created designated Provider Care Teams. These Care Teams include your primary Cardiologist (physician) and Advanced Practice Providers (APPs- Physician Assistants and Nurse Practitioners) who all work together to provide you with the care you need, when you need it.   You may see any of the following providers on your designated Care Team at your next follow up: Dr Glori Bickers Dr Haynes Kerns, NP Lyda Jester,  [place] Audry Riles, PharmD   Please be sure to bring in all your medications bottles to every appointment.

## 2022-04-03 ENCOUNTER — Other Ambulatory Visit: Payer: Self-pay | Admitting: Family Medicine

## 2022-04-03 ENCOUNTER — Ambulatory Visit (HOSPITAL_BASED_OUTPATIENT_CLINIC_OR_DEPARTMENT_OTHER): Payer: Medicare Other | Admitting: Family

## 2022-04-04 ENCOUNTER — Other Ambulatory Visit (HOSPITAL_COMMUNITY): Payer: Self-pay

## 2022-04-04 DIAGNOSIS — M1711 Unilateral primary osteoarthritis, right knee: Secondary | ICD-10-CM | POA: Diagnosis not present

## 2022-04-05 ENCOUNTER — Encounter (HOSPITAL_COMMUNITY): Payer: Self-pay

## 2022-04-05 ENCOUNTER — Ambulatory Visit (HOSPITAL_COMMUNITY)
Admission: RE | Admit: 2022-04-05 | Discharge: 2022-04-05 | Disposition: A | Discharge status: Home / Self Care | Payer: Medicare Other | Source: Ambulatory Visit | Attending: Cardiology | Admitting: Cardiology

## 2022-04-05 ENCOUNTER — Telehealth (HOSPITAL_COMMUNITY): Payer: Self-pay | Admitting: *Deleted

## 2022-04-05 VITALS — BP 132/90 | HR 72

## 2022-04-05 DIAGNOSIS — I4811 Longstanding persistent atrial fibrillation: Secondary | ICD-10-CM | POA: Diagnosis not present

## 2022-04-05 DIAGNOSIS — I5033 Acute on chronic diastolic (congestive) heart failure: Secondary | ICD-10-CM | POA: Insufficient documentation

## 2022-04-05 DIAGNOSIS — I272 Pulmonary hypertension, unspecified: Secondary | ICD-10-CM | POA: Diagnosis not present

## 2022-04-05 DIAGNOSIS — N1832 Chronic kidney disease, stage 3b: Secondary | ICD-10-CM | POA: Diagnosis not present

## 2022-04-05 DIAGNOSIS — G4733 Obstructive sleep apnea (adult) (pediatric): Secondary | ICD-10-CM | POA: Insufficient documentation

## 2022-04-05 DIAGNOSIS — I5042 Chronic combined systolic (congestive) and diastolic (congestive) heart failure: Secondary | ICD-10-CM

## 2022-04-05 DIAGNOSIS — M109 Gout, unspecified: Secondary | ICD-10-CM | POA: Diagnosis not present

## 2022-04-05 DIAGNOSIS — Z7901 Long term (current) use of anticoagulants: Secondary | ICD-10-CM | POA: Diagnosis not present

## 2022-04-05 DIAGNOSIS — Z79899 Other long term (current) drug therapy: Secondary | ICD-10-CM | POA: Diagnosis not present

## 2022-04-05 DIAGNOSIS — I130 Hypertensive heart and chronic kidney disease with heart failure and stage 1 through stage 4 chronic kidney disease, or unspecified chronic kidney disease: Secondary | ICD-10-CM | POA: Insufficient documentation

## 2022-04-05 DIAGNOSIS — M25562 Pain in left knee: Secondary | ICD-10-CM

## 2022-04-05 DIAGNOSIS — Z794 Long term (current) use of insulin: Secondary | ICD-10-CM | POA: Diagnosis not present

## 2022-04-05 DIAGNOSIS — M25561 Pain in right knee: Secondary | ICD-10-CM | POA: Diagnosis not present

## 2022-04-05 DIAGNOSIS — E1122 Type 2 diabetes mellitus with diabetic chronic kidney disease: Secondary | ICD-10-CM | POA: Diagnosis not present

## 2022-04-05 LAB — BRAIN NATRIURETIC PEPTIDE: B Natriuretic Peptide: 271.6 pg/mL — ABNORMAL HIGH (ref 0.0–100.0)

## 2022-04-05 LAB — BASIC METABOLIC PANEL
Anion gap: 8 (ref 5–15)
BUN: 14 mg/dL (ref 8–23)
CO2: 30 mmol/L (ref 22–32)
Calcium: 8.3 mg/dL — ABNORMAL LOW (ref 8.9–10.3)
Chloride: 99 mmol/L (ref 98–111)
Creatinine, Ser: 1.37 mg/dL — ABNORMAL HIGH (ref 0.61–1.24)
GFR, Estimated: 55 mL/min — ABNORMAL LOW (ref >60)
Glucose, Bld: 94 mg/dL (ref 70–99)
Potassium: 4.5 mmol/L (ref 3.5–5.1)
Sodium: 137 mmol/L (ref 135–145)

## 2022-04-05 LAB — URIC ACID: Uric Acid, Serum: 10.2 mg/dL — ABNORMAL HIGH (ref 3.7–8.6)

## 2022-04-05 NOTE — Telephone Encounter (Signed)
Call attempted to confirm [facility] TOC appt 12 noon on 04/05/22. HIPPA appropriate VM left with callback number.    Rhae Hammock, BSN, Scientist, clinical (histocompatibility and immunogenetics) Only

## 2022-04-05 NOTE — Progress Notes (Signed)
HEART & VASCULAR TRANSITION OF CARE PROGRESS NOTE     Referring Physician: Dr. Roger Shelter  Primary Care: Dr. Anitra Lauth Primary Cardiologist: Dr. Debara Pickett  HPI: Referred to clinic by Dr. Roger Shelter for heart failure consultation. 71 y.o. male with history of morbid obesity, DM II, chronic diastolic CHF, persistent AF, HTN, lymphedema of both LE with chronic wounds. Follows with Dr. Debara Pickett from [facility].   Admitted 04/23 with a/c CHF. Reports he had been eating a lot of high-sodium meals while staying in a hotel waiting to move into a new apartment. Diuresed with IV lasix and treated with for UTI. Transferred from [facility] to [name] for further cardiac workup. Echo during admit: EF 55-60%, RV not well visualized. RHC: RA 18, PA 63/19 (35), Fick CO/CI 10.5/3.8, Thermo CO/CI 7.2/2.6, PVR 1.9 WU (thermo), PAPi 2.4. Suspected pulmonary HTN likely d/t combination of left heart disease and primary pulmonary hypertension related to untreated OSA and obesity. 78 lb weight loss during admit. Discharged on 40 mg Torsemide daily. Discharge weight 333 lb. Referred to [facility] clinic.   At initial [facility] clinic visit 03/07/22, he was fluid overloaded and had gained 20 lb at SNF. Legs were swollen. Reported getting salty foods at facility. Also drinking lots of water. Torsemide was increased to 80 mg daily and instructed to take 1 dose of metolazone 2.5. Advised to return to [facility] for 2nd visit w/ plans to send back to gen cards.   Today at f/u he remains fluid overloaded w/ 2+ LEE. Wearing unna boots. Unable to stand for wt given bilateral knee pain. Denies SOB at rest but some mild DOE. Functional status/mobility remains poor. Has b/l knee OA. Still weak. Getting PT. Report UOP is not robust w/ current torsemide dose.   I offered dose of IV Lasix while in clinic but he declined. Struggles to get up to use the bathroom quickly and has a 45 min ride back to SNF.   Review of Systems: [y] = yes, [ ]  = no   General: Weight gain  [] ; Weight loss [ ] ; Anorexia [ ] ; Fatigue [ ] ; Fever [ ] ; Chills [ ] ; Weakness [ ]   Cardiac: Chest pain/pressure [ ] ; Resting SOB [ ] ; Exertional SOB [Y]; Orthopnea [ ] ; Pedal Edema [Y]; Palpitations [ ] ; Syncope [ ] ; Presyncope [ ] ; Paroxysmal nocturnal dyspnea[ ]   Pulmonary: Cough [ ] ; Wheezing[ ] ; Hemoptysis[ ] ; Sputum [ ] ; Snoring [ ]   GI: Vomiting[ ] ; Dysphagia[ ] ; Melena[ ] ; Hematochezia [ ] ; Heartburn[ ] ; Abdominal pain [ ] ; Constipation [ ] ; Diarrhea [ ] ; BRBPR [ ]   GU: Hematuria[ ] ; Dysuria [ ] ; Nocturia[ ]   Vascular: Pain in legs with walking [ ] ; Pain in feet with lying flat [ ] ; Non-healing sores [Y]; Stroke [ ] ; TIA [ ] ; Slurred speech [ ] ;  Neuro: Headaches[ ] ; Vertigo[ ] ; Seizures[ ] ; Paresthesias[ ] ;Blurred vision [ ] ; Diplopia [ ] ; Vision changes [ ]   Ortho/Skin: Arthritis [Y ]; Joint pain [ Y]; Muscle pain [ ] ; Joint swelling [ ] ; Back Pain [ ] ; Rash [ ]   Psych: Depression[ ] ; Anxiety[ ]   Heme: Bleeding problems [ ] ; Clotting disorders [ ] ; Anemia [ ]   Endocrine: Diabetes [ ] ; Thyroid dysfunction[ ]    Past Medical History:  Diagnosis Date   ALLERGIC RHINITIS 08/11/2006   ASTHMA 08/11/2006   Bradycardia    Beta blocker d/c'd 2022   Chronic atrial fibrillation ([place]) 08/2014   Chronic combined systolic and diastolic CHF (congestive heart failure) ([place])  Cardiology f/u 12/2017: pt volume overloaded (R heart dysf suspected), BNP very high, lasix increased.  Repeat echo 12/2017: normal LV EF, mild DD, +RV syst dysfxn, mod pulm HTN, biatrial enlgmt.   Chronic constipation    Chronic renal insufficiency, stage 3 (moderate) (HCC)    Colon cancer screening 02/2021   02/2021 Cologuard POS->GI ref   DIABETES MELLITUS, TYPE II 08/11/2006   HYPERTENSION 08/11/2006   Impaired mobility and endurance    MRSA infection 06/2018   LL venous stasis ulcer infected   Normocytic anemia 2016-2019   03/2018 B12 normal, iron ok (ferritin borderline low).   OBESITY, MORBID 12/14/2007   saxenda  started 05/2020 by [facility] wt mgmt center   OSA (obstructive sleep apnea)    to get sleep study with [facility]) as of 12/03/2018 consult.   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  [id number] ID started him on amoxil prophylaxis---ineffective.  End 2018/Jan 2019 penicillin G injections prophyl helpful but pt declined to continue this as of 01/2018 ID f/u.  ID talked him into resuming monthly penicillin G as of 02/2018 f/u.     Restless leg syndrome    Rx'd clonazepam 09/2017 and pt refused to take it after reading the medication's potential side effects.   Venous stasis ulcers of both lower extremities (HCC)    Severe lymphedema.  wound clinic care ongoing as of 01/2018    Current Outpatient Medications  Medication Sig Dispense Refill   apixaban (ELIQUIS) 5 MG TABS tablet Take 5 mg by mouth 2 (two) times daily.     b complex vitamins capsule Take 1 capsule by mouth every morning.     cetirizine (ZYRTEC) 10 MG tablet Take 10 mg by mouth at bedtime.     diltiazem (TIAZAC) 120 MG 24 hr capsule Take 1 capsule (120 mg total) by mouth daily. 90 capsule 3   glucose blood (ONETOUCH ULTRA) test strip CHECK BLOOD SUGAR TWICE  DAILY 200 strip 0   HYDROcodone-acetaminophen (NORCO/VICODIN) 5-325 MG tablet Take 1-2 tablets by mouth every 6 (six) hours as needed for moderate pain. 1-2 tabs po bid prn pain 10 tablet 0   insulin aspart protamine- aspart (NOVOLOG MIX 70/30) (70-30) 100 UNIT/ML injection Inject 0.3 mLs (30 Units total) into the skin 2 (two) times daily with a meal. 10 mL 0   Insulin Pen Needle (B-D ULTRAFINE III SHORT PEN) 31G X 8 MM MISC USE 1 PENNEEDLE TWICE DAILY 200 each 1   isosorbide mononitrate (IMDUR) 30 MG 24 hr tablet Take 0.5 tablets (15 mg total) by mouth daily.  3   metolazone (ZAROXOLYN) 2.5 MG tablet Take 1 tablet (2.5 mg total) by mouth as directed. By the HF Clinic. 10 tablet 2   Multiple Vitamin  (MULTIVITAMIN) tablet Take 1 tablet by mouth daily.     nystatin (MYCOSTATIN/NYSTOP) powder Apply 1 application. topically 3 (three) times daily as needed.     potassium chloride SA (KLOR-CON M20) 20 MEQ tablet Take 1 tablet (20 mEq total) by mouth daily. Take 1 extra tablet on the days you take Metolazone 45 tablet 2   senna-docusate (SENOKOT-S) 8.6-50 MG tablet Take 1 tablet by mouth at bedtime.     terazosin (HYTRIN) 10 MG capsule Take 10 mg by mouth at bedtime.     Torsemide 40 MG TABS Take 40 mg by mouth daily.     traMADol (ULTRAM) 50 MG tablet Take 50 mg by mouth  daily at 6 (six) AM.     vitamin E 400 UNIT capsule Take 400 Units by mouth every morning. Vitamin E with Selenium     No current facility-administered medications for this encounter.    Allergies  Allergen Reactions   Other Other (See Comments)    Sneezing, coughing   Hydralazine Other (See Comments)    Drowsiness/sedation/mental fog      Social History   Socioeconomic History   Marital status: Single    Spouse name: Not on file   Number of children: 0   Years of education: 14   Highest education level: Associate degree: occupational, Hotel manager, or vocational program  Occupational History   Occupation: retired  Tobacco Use   Smoking status: Never    Passive exposure: Never   Smokeless tobacco: Never  Vaping Use   Vaping Use: Never used  Substance and Sexual Activity   Alcohol use: No   Drug use: No   Sexual activity: Not Currently  Other Topics Concern   Not on file  Social History Narrative   Single, no children.   Lives in [place] since 1985 [place] from [place]).   Occupation: Counsellor for [employer].   No Tobacco, no alc, drugs.   Exercise: no    Diet: no      Social Determinants of Health   Financial Resource Strain: Medium Risk   Difficulty of Paying Living Expenses: Somewhat hard  Food Insecurity: Food Insecurity Present   Worried About Charity fundraiser in the Last  Year: Sometimes true   Ran Out of Food in the Last Year: Sometimes true  Transportation Needs: No Transportation Needs   Lack of Transportation (Medical): No   Lack of Transportation (Non-Medical): No  Physical Activity: Inactive   Days of Exercise per Week: 0 days   Minutes of Exercise per Session: 0 min  Stress: Stress Concern Present   Feeling of Stress : Very much  Social Connections: Moderately Integrated   Frequency of Communication with Friends and Family: Three times a week   Frequency of Social Gatherings with Friends and Family: Never   Attends Religious Services: 1 to 4 times per year   Active Member of [employer] or Organizations: Yes   Attends Archivist Meetings: 1 to 4 times per year   Marital Status: Never married  Human resources officer Violence: Not At Risk   Fear of Current or Ex-Partner: No   Emotionally Abused: No   Physically Abused: No   Sexually Abused: No      Family History  Problem Relation Age of Onset   Diabetes Mother    Diabetes Sister    Hydrocephalus Sister        NPH    Vitals:   04/05/22 1202  BP: 132/90  Pulse: 72  SpO2: 94%   PHYSICAL EXAM: General:  super morbidly obese WM in [place]. No respiratory difficulty HEENT: normal Neck: supple. JVD 8 cm. Carotids 2+ bilat; no bruits. No lymphadenopathy or thyromegaly appreciated. Cor: PMI nondisplaced. Irregularly irregular rhythm. No rubs, gallops or murmurs. Lungs: decreased BS at the bases bilaterally  Abdomen: obese, soft, nontender, nondistended. No hepatosplenomegaly. No bruits or masses. Good bowel sounds. Extremities: no cyanosis, clubbing, rash, + unna boots, 2+ edema to knees  Neuro: alert & oriented x 3, cranial nerves grossly intact. moves all 4 extremities w/o difficulty. Affect pleasant.   ECG: Not performed   ASSESSMENT & PLAN: Acute on chronic diastolic CHF with RV failure -Echo 04/23: EF  55-60%, RV not well visualized.  -RHC 04/23: RA 18, PA 63/19 (35), Fick CO/CI  10.5/3.8, Thermo CO/CI 7.2/2.6, PVR 1.9 WU (thermo), PAPi 2.4. -NYHA very difficult to assess d/t functional impairment from OA. Suspect NYHA III. -Volume appears elevated. Notes UOP w/ torsemide not robust  - Continue Torsemide 80 mg daily. - Instructed to take 2.5 mg of metolazone + 40 mEq of KCL daily for the next 2 days, then change to once weekly dosing qWednesdays (starting 6/7).  - Check BMP and BNP today and will ask SNF to recheck BMP in 1 wk and fax results to clinic -GDMT limited by CKD, recent Scr > 2  -No SGLT2i d/t morbid obesity and risk of UTI -2 gm sodium restriction. Fluid restrict 2000 ml  -Suspect significant OSA/OHS. Recommend sleep study once discharged from SNF.  2. Pulmonary hypertension -RHC as above -likely d/t combination of left heart disease and primary pulmonary hypertension related to untreated OSA and obesity - diuretics per above  -Eventually need sleep study once discharged from SNF  3. Longstanding persistent atrial fibrillation -Rate controlled on diltiazem 120 mg daily -Would likely be extremely difficult to restore SR -Anticoagulated with Eliquis 5 mg BID  4. CKD IIIb -Baseline Scr 1.6-1.8 - Check BMP today and repeat in 1 wk   5. DM II -Insulin dependent -A1c 8.4% in March 2023 -No SGLT2i with morbid obesity/risk UTI  6. HTN - controlled on current regimen   7. Lymphedema with b/l LE wounds -Has followed with wound center previously. -Covered with abx for possible cellulitis during recent admit -Has on UNNA boots today  8. B/l Knee Pain  - has chronic OA but pain acutely worsened - ? Gout in the setting of recent aggressive diuresis  - check uric acid  - can treat w/ prednisone burst 40 mg daily x 3 days if elevated    NYHA Very difficult to assess d/t functional impairment from OA. Suspect NYHA III. GDMT  Diuretic, Torsemide 80 mg daily + once weekly metolazone 2.5 mg  BB, No, EF preserved Ace/ARB/ARNI, no CKD (can eventually  consider if Scr stable) MRA, no CKD SGLT2i, no risk of UTI, obesity    Referred to HFSW (PCP, Medications, Transportation, ETOH Abuse, Drug Abuse, Insurance, Financial ): No Refer to Pharmacy: No Refer to Home Health: No Refer to Advanced Heart Failure Clinic: No  Refer to General Cardiology: No, already established with Dr. Debara Pickett  Follow up: keep f/u w/ Dr. Lysbeth Penner team in 2 weeks. Recommend setting up for sleep study evaluation.

## 2022-04-05 NOTE — Patient Instructions (Addendum)
Instructions below were given to SNF via order form [name])  TAKE Metolazone 2.5 mg on 04/06/2022 and again on 04/07/2022 with additional 40 meq of potasium.  Starting Wednesday 04/10/2022 take Metolazone 2.5 mg one tab weekly with additional 40 meq of potassium  Labs today We will only contact you if something comes back abnormal or we need to make some changes. Otherwise no news is good news!  Labs needed in one week (will be done at the facility)   Keep cardiology follow up as scheduled

## 2022-04-09 NOTE — Progress Notes (Signed)
[facility])                                            [facility] Quality Pharmacy Team                                        Statin Quality Measure Assessment    04/09/2022  [name] 1951/01/11 [id number]  Per review of chart and payor information, this patient has been flagged for non-adherence to the following CMS Quality Measure:   [x]  Statin Use in Persons with Diabetes  []  Statin Use in Persons with Cardiovascular Disease  The ASCVD Risk score (Arnett DK, et al., 2019) failed to calculate for the following reasons:   The valid total cholesterol range is 130 to 320 mg/dL   This patient is failing SUPD CMS measure. Last LDL 46 (as of 04/04/2021). If deemed clinically appropriate, please consider assessing statin use upon updated lipid panel.   Please consider ONE of the following recommendations:   Initiate high intensity statin Atorvastatin 40mg  once daily, #90, 3 refills   Rosuvastatin 20mg  once daily, #90, 3 refills    Initiate moderate intensity          statin with reduced frequency if prior          statin intolerance 1x weekly, #13, 3 refills   2x weekly, #26, 3 refills   3x weekly, #39, 3 refills   Code for past statin intolerance or other exclusions (required annually)  Drug Induced Myopathy G72.0   Myositis, unspecified M60.9   Rhabdomyolysis M62.82   Cirrhosis of liver K74.69   Biliary cirrhosis, unspecified K74.5   Abnormal blood glucose - for SUPD ONLY R73.09   Prediabetes - for SUPD ONLY  R73.03   Polycystic ovarian syndrome E28.2   Adverse effect of antihyperlipidemic and antiarteriosclerotic drugs, initial encounter 2020   Thank you for your time,  06/04/2021, PharmD Clinical Pharmacist [facility] Cell: [phone]

## 2022-04-10 ENCOUNTER — Encounter: Payer: Medicare Other | Admitting: Family Medicine

## 2022-04-11 ENCOUNTER — Ambulatory Visit (INDEPENDENT_AMBULATORY_CARE_PROVIDER_SITE_OTHER): Payer: Medicare Other | Admitting: Family Medicine

## 2022-04-11 ENCOUNTER — Encounter: Payer: Self-pay | Admitting: Family Medicine

## 2022-04-11 ENCOUNTER — Telehealth: Payer: Self-pay

## 2022-04-11 VITALS — BP 103/64 | HR 62 | Temp 97.7°F | Ht 70.0 in | Wt 324.6 lb

## 2022-04-11 DIAGNOSIS — M170 Bilateral primary osteoarthritis of knee: Secondary | ICD-10-CM | POA: Diagnosis not present

## 2022-04-11 DIAGNOSIS — N1831 Chronic kidney disease, stage 3a: Secondary | ICD-10-CM

## 2022-04-11 DIAGNOSIS — D509 Iron deficiency anemia, unspecified: Secondary | ICD-10-CM

## 2022-04-11 DIAGNOSIS — N183 Chronic kidney disease, stage 3 unspecified: Secondary | ICD-10-CM

## 2022-04-11 DIAGNOSIS — R7989 Other specified abnormal findings of blood chemistry: Secondary | ICD-10-CM | POA: Diagnosis not present

## 2022-04-11 DIAGNOSIS — E1121 Type 2 diabetes mellitus with diabetic nephropathy: Secondary | ICD-10-CM

## 2022-04-11 DIAGNOSIS — M10061 Idiopathic gout, right knee: Secondary | ICD-10-CM

## 2022-04-11 DIAGNOSIS — E1122 Type 2 diabetes mellitus with diabetic chronic kidney disease: Secondary | ICD-10-CM | POA: Diagnosis not present

## 2022-04-11 DIAGNOSIS — I5032 Chronic diastolic (congestive) heart failure: Secondary | ICD-10-CM

## 2022-04-11 DIAGNOSIS — I50812 Chronic right heart failure: Secondary | ICD-10-CM

## 2022-04-11 NOTE — Telephone Encounter (Signed)
Pt mentioned during appt that care management nurse at current rehab facility may have information regarding services to offer beyond stay at rehab place.   [facility] Address: [address] [place], [place] [address] Phone: [phone]

## 2022-04-11 NOTE — Progress Notes (Unsigned)
OFFICE VISIT  04/11/2022  CC: No chief complaint on file.  Patient is a 71 y.o. male who presents for hosp f/u for acute on chronic diastolic HF.  INTERIM HX: ***  78 lb weight loss during admit!  He was seen for initial visit with heart failure clinic about a week ago.  Per their note: "NYHA Very difficult to assess d/t functional impairment from OA. Suspect NYHA III. GDMT  Diuretic, Torsemide 80 mg daily + once weekly metolazone 2.5 mg  BB, No, EF preserved Ace/ARB/ARNI, no CKD (can eventually consider if Scr stable) MRA, no CKD SGLT2i, no, risk of UTI, obesity"  Past Medical History:  Diagnosis Date   ALLERGIC RHINITIS 08/11/2006   ASTHMA 08/11/2006   Bradycardia    Beta blocker d/c'd 2022   Chronic atrial fibrillation ([place]) 08/2014   Chronic combined systolic and diastolic CHF (congestive heart failure) [facility])    Cardiology f/u 12/2017: pt volume overloaded (R heart dysf suspected), BNP very high, lasix increased.  Repeat echo 12/2017: normal LV EF, mild DD, +RV syst dysfxn, mod pulm HTN, biatrial enlgmt.   Chronic constipation    Chronic renal insufficiency, stage 3 (moderate) (HCC)    Colon cancer screening 02/2021   02/2021 Cologuard POS->GI ref   DIABETES MELLITUS, TYPE II 08/11/2006   HYPERTENSION 08/11/2006   Impaired mobility and endurance    MRSA infection 06/2018   LL venous stasis ulcer infected   Normocytic anemia 2016-2019   03/2018 B12 normal, iron ok (ferritin borderline low).   OBESITY, MORBID 12/14/2007   saxenda started 05/2020 by [facility] wt mgmt center   OSA (obstructive sleep apnea)    to get sleep study with [facility]) as of 12/03/2018 consult.   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  92018 ID started him on amoxil prophylaxis---ineffective.  End 2018/Jan 2019 penicillin G injections prophyl helpful but pt declined to continue this as of 01/2018 ID f/u.  ID talked him  into resuming monthly penicillin G as of 02/2018 f/u.     Restless leg syndrome    Rx'd clonazepam 09/2017 and pt refused to take it after reading the medication's potential side effects.   Venous stasis ulcers of both lower extremities (HCC)    Severe lymphedema.  wound clinic care ongoing as of 01/2018    Past Surgical History:  Procedure Laterality Date   Carotid dopplers  07/23/2018   Left NORMAL.  Right 1-39% ICA stenosis, with <50% distal CCA stenosis (not hemodynamically significant)   LE venous dopplers Bilateral 03/06/2022   no dvt   RIGHT HEART CATH N/A 02/19/2022   Marked volume overload, R>>L HF, preserved CO. Procedure: RIGHT HEART CATH;  Surgeon: Jolaine Artist, MD;  Location: [place] [facility];  Service: Cardiovascular;  Laterality: N/A;   TRANSTHORACIC ECHOCARDIOGRAM  11/2007; 09/2014; 11/2015;12/2017   LV fxn normal, EF normal, mild dilation of left atrium.  2015 grade II diast dysfxn.  2017 EF 55-60%. 2019: LVEF 60-65%, mild RV syst dysf,biatrial enlgmt, mod pulm htn. 02/14/22 EF 55-60%, unable to assess diast fxn, LVH, o/w normal   URETERAL STENT PLACEMENT     virtual colonoscopy  01/2011   Normal    Outpatient Medications Prior to Visit  Medication Sig Dispense Refill   apixaban (ELIQUIS) 5 MG TABS tablet Take 5 mg by mouth 2 (two) times daily.     b complex vitamins capsule Take 1 capsule by mouth every morning.  cetirizine (ZYRTEC) 10 MG tablet Take 10 mg by mouth at bedtime.     diltiazem (TIAZAC) 120 MG 24 hr capsule Take 1 capsule (120 mg total) by mouth daily. 90 capsule 3   glucose blood (ONETOUCH ULTRA) test strip CHECK BLOOD SUGAR TWICE  DAILY 200 strip 0   HYDROcodone-acetaminophen (NORCO/VICODIN) 5-325 MG tablet Take 1-2 tablets by mouth every 6 (six) hours as needed for moderate pain. 1-2 tabs po bid prn pain 10 tablet 0   insulin aspart protamine- aspart (NOVOLOG MIX 70/30) (70-30) 100 UNIT/ML injection Inject 0.3 mLs (30 Units total) into the skin 2  (two) times daily with a meal. 10 mL 0   Insulin Pen Needle (B-D ULTRAFINE III SHORT PEN) 31G X 8 MM MISC USE 1 PENNEEDLE TWICE DAILY 200 each 1   isosorbide mononitrate (IMDUR) 30 MG 24 hr tablet Take 0.5 tablets (15 mg total) by mouth daily.  3   metolazone (ZAROXOLYN) 2.5 MG tablet Take 1 tablet (2.5 mg total) by mouth as directed. By the HF Clinic. 10 tablet 2   Multiple Vitamin (MULTIVITAMIN) tablet Take 1 tablet by mouth daily.     nystatin (MYCOSTATIN/NYSTOP) powder Apply 1 application. topically 3 (three) times daily as needed.     potassium chloride SA (KLOR-CON M20) 20 MEQ tablet Take 1 tablet (20 mEq total) by mouth daily. Take 1 extra tablet on the days you take Metolazone 45 tablet 2   senna-docusate (SENOKOT-S) 8.6-50 MG tablet Take 1 tablet by mouth at bedtime.     terazosin (HYTRIN) 10 MG capsule Take 10 mg by mouth at bedtime.     Torsemide 40 MG TABS Take 40 mg by mouth daily.     traMADol (ULTRAM) 50 MG tablet Take 50 mg by mouth daily at 6 (six) AM.     vitamin E 400 UNIT capsule Take 400 Units by mouth every morning. Vitamin E with Selenium     No facility-administered medications prior to visit.    Allergies  Allergen Reactions   Other Other (See Comments)    Sneezing, coughing   Hydralazine Other (See Comments)    Drowsiness/sedation/mental fog    ROS As per HPI  PE:    04/11/2022    1:12 PM 04/05/2022   12:02 PM 03/29/2022   11:13 AM  Vitals with BMI  Height 5\' 10"     Weight 324 lbs 10 oz    BMI [id number]    Systolic [id number]  Diastolic 64 90 62  Pulse 62 72 75   Physical Exam  ***  LABS:  Last CBC Lab Results  Component Value Date   WBC 10.1 03/05/2022   HGB 10.2 (L) 03/05/2022   HCT 31.8 (L) 03/05/2022   MCV 71.6 (L) 03/05/2022   MCH 23.0 (L) 03/05/2022   RDW 19.6 (H) 03/05/2022   PLT 437 (H) 03/05/2022   Lab Results  Component Value Date   IRON 45 03/10/2018   FERRITIN 26.4 [id number]   Last metabolic panel Lab Results  Component  Value Date   GLUCOSE 94 04/05/2022   NA 137 04/05/2022   K 4.5 04/05/2022   CL 99 04/05/2022   CO2 30 04/05/2022   BUN 14 04/05/2022   CREATININE 1.37 (H) 04/05/2022   GFRNONAA 55 (L) 04/05/2022   CALCIUM 8.3 (L) 04/05/2022   PHOS 4.2 02/14/2022   PROT 7.1 02/28/2022   ALBUMIN 2.8 (L) 02/28/2022   BILITOT 0.7 02/28/2022   ALKPHOS 96 02/28/2022   AST 25  02/28/2022   ALT 26 02/28/2022   ANIONGAP 8 04/05/2022   Last lipids Lab Results  Component Value Date   CHOL 101 04/04/2021   HDL 41 04/04/2021   LDLCALC 46 04/04/2021   TRIG 61 04/04/2021   CHOLHDL 2.5 04/04/2021   Last hemoglobin A1c Lab Results  Component Value Date   HGBA1C 8.4 (H) 01/09/2022   Last thyroid functions Lab Results  Component Value Date   TSH 6.21 (H) 09/19/2021   T3TOTAL 70 (L) 06/08/2018   Last vitamin B12 and Folate Lab Results  Component Value Date   [id number] 03/10/2018   IMPRESSION AND PLAN:  No problem-specific Assessment & Plan notes found for this encounter.  Microcytic anemia, no iron study in emr Needs sleep study??  An After Visit Summary was printed and given to the patient.  FOLLOW UP: No follow-ups on file.  Signed:  Crissie Sickles, MD           04/11/2022

## 2022-04-12 LAB — BASIC METABOLIC PANEL
BUN: 28 mg/dL — ABNORMAL HIGH (ref 6–23)
CO2: 33 mEq/L — ABNORMAL HIGH (ref 19–32)
Calcium: 8.9 mg/dL (ref 8.4–10.5)
Chloride: 90 mEq/L — ABNORMAL LOW (ref 96–112)
Creatinine, Ser: 1.79 mg/dL — ABNORMAL HIGH (ref 0.40–1.50)
GFR: 37.78 mL/min — ABNORMAL LOW (ref >60.00)
Glucose, Bld: 136 mg/dL — ABNORMAL HIGH (ref 70–99)
Potassium: 4.2 mEq/L (ref 3.5–5.1)
Sodium: 135 mEq/L (ref 135–145)

## 2022-04-12 LAB — CBC WITH DIFFERENTIAL/PLATELET
Basophils Absolute: 0.1 10*3/uL (ref 0.0–0.1)
Basophils Relative: 0.6 % (ref 0.0–3.0)
Eosinophils Absolute: 0.4 10*3/uL (ref 0.0–0.7)
Eosinophils Relative: 4.4 % (ref 0.0–5.0)
HCT: 36.9 % — ABNORMAL LOW (ref 39.0–52.0)
Hemoglobin: 11.5 g/dL — ABNORMAL LOW (ref 13.0–17.0)
Lymphocytes Relative: 13.4 % (ref 12.0–46.0)
Lymphs Abs: 1.4 10*3/uL (ref 0.7–4.0)
MCHC: 31.3 g/dL (ref 30.0–36.0)
MCV: 70.5 fl — ABNORMAL LOW (ref 78.0–100.0)
Monocytes Absolute: 0.7 10*3/uL (ref 0.1–1.0)
Monocytes Relative: 7.2 % (ref 3.0–12.0)
Neutro Abs: 7.5 10*3/uL (ref 1.4–7.7)
Neutrophils Relative %: 74.4 % (ref 43.0–77.0)
Platelets: 521 10*3/uL — ABNORMAL HIGH (ref 150.0–400.0)
RBC: 5.24 Mil/uL (ref 4.22–5.81)
RDW: 21.3 % — ABNORMAL HIGH (ref 11.5–15.5)
WBC: 10.1 10*3/uL (ref 4.0–10.5)

## 2022-04-12 LAB — T4, FREE: Free T4: 1.18 ng/dL (ref 0.60–1.60)

## 2022-04-12 LAB — T3: T3, Total: 82 ng/dL (ref 76–181)

## 2022-04-12 LAB — IRON,TIBC AND FERRITIN PANEL
%SAT: 7 % (calc) — ABNORMAL LOW (ref 20–48)
Ferritin: 71 ng/mL (ref 24–380)
Iron: 22 ug/dL — ABNORMAL LOW (ref 50–180)
TIBC: 299 mcg/dL (calc) (ref 250–425)

## 2022-04-12 LAB — TSH: TSH: 5.86 u[IU]/mL — ABNORMAL HIGH (ref 0.35–5.50)

## 2022-04-12 LAB — HEMOGLOBIN A1C: Hgb A1c MFr Bld: 8.1 % — ABNORMAL HIGH (ref 4.6–6.5)

## 2022-04-15 DIAGNOSIS — M19012 Primary osteoarthritis, left shoulder: Secondary | ICD-10-CM | POA: Diagnosis not present

## 2022-04-15 DIAGNOSIS — Z5181 Encounter for therapeutic drug level monitoring: Secondary | ICD-10-CM | POA: Diagnosis not present

## 2022-04-15 DIAGNOSIS — I509 Heart failure, unspecified: Secondary | ICD-10-CM | POA: Diagnosis not present

## 2022-04-15 NOTE — Telephone Encounter (Signed)
Okay, Ortho referral ordered.

## 2022-04-15 NOTE — Telephone Encounter (Signed)
Spoke with nurse and pt is getting discharged on Wednesday 6/14. Pt is supposed to have order for home health services, if his insurance covers this. He will continue. If not, she will call back and let me know. Pt also wanted to know if orthopedic referral could be placed.   Please review and advise regarding ortho referral

## 2022-04-15 NOTE — Telephone Encounter (Signed)
Spoke with pt regarding recommendations, he will contact [facility]

## 2022-04-16 ENCOUNTER — Encounter (HOSPITAL_BASED_OUTPATIENT_CLINIC_OR_DEPARTMENT_OTHER): Payer: Self-pay | Admitting: Family

## 2022-04-16 ENCOUNTER — Ambulatory Visit (HOSPITAL_BASED_OUTPATIENT_CLINIC_OR_DEPARTMENT_OTHER): Payer: Medicare Other | Admitting: Family

## 2022-04-16 VITALS — BP 106/70 | HR 73 | Ht 70.0 in | Wt 315.0 lb

## 2022-04-16 DIAGNOSIS — I5032 Chronic diastolic (congestive) heart failure: Secondary | ICD-10-CM

## 2022-04-16 DIAGNOSIS — N1832 Chronic kidney disease, stage 3b: Secondary | ICD-10-CM

## 2022-04-16 DIAGNOSIS — D6859 Other primary thrombophilia: Secondary | ICD-10-CM

## 2022-04-16 DIAGNOSIS — I272 Pulmonary hypertension, unspecified: Secondary | ICD-10-CM | POA: Diagnosis not present

## 2022-04-16 DIAGNOSIS — I4821 Permanent atrial fibrillation: Secondary | ICD-10-CM

## 2022-04-16 MED ORDER — POTASSIUM CHLORIDE CRYS ER 20 MEQ PO TBCR: EXTENDED_RELEASE_TABLET | ORAL | 2 refills | Status: DC

## 2022-04-16 MED ORDER — METOLAZONE 2.5 MG PO TABS: 2.5000 mg | ORAL_TABLET | ORAL | 2 refills | Status: DC

## 2022-04-16 NOTE — Progress Notes (Unsigned)
Office Visit    Patient Name: [name] Date of Encounter: 04/16/2022  PCP:  Tammi Sou, MD   [place]  Cardiologist:  Pixie Casino, MD  Advanced Practice Provider:  Almyra Deforest, [place] Electrophysiologist:  None      Chief Complaint    [name] is a 71 y.o. 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  Repeat echo 12/2017: normal LV EF, mild DD, +RV syst dysfxn, mod pulm HTN, biatrial enlgmt.   Chronic constipation    Chronic renal insufficiency, stage 3 (moderate) (HCC)    Colon cancer screening 02/2021   02/2021 Cologuard POS->GI ref   DIABETES MELLITUS, TYPE II 08/11/2006   HYPERTENSION 08/11/2006   Impaired mobility and endurance    MRSA infection 06/2018   LL venous stasis ulcer infected   Normocytic anemia 2016-2019   03/2018 B12 normal, iron ok (ferritin borderline low).   OBESITY, MORBID 12/14/2007   saxenda started 05/2020 by [facility] wt mgmt center   OSA (obstructive sleep apnea)    to get sleep study with [facility]) as of 12/03/2018 consult.   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  92018 ID started him on amoxil prophylaxis---ineffective.  End 2018/Jan 2019 penicillin G injections prophyl helpful but pt declined to continue this as of 01/2018 ID f/u.  ID talked him into resuming  monthly penicillin G as of 02/2018 f/u.     Restless leg syndrome    Rx'd clonazepam 09/2017 and pt refused to take it after reading the medication's potential side effects.   Venous stasis ulcers of both lower extremities (HCC)    Severe lymphedema.  wound clinic care ongoing as of 01/2018   Past Surgical History:  Procedure Laterality Date   Carotid dopplers  07/23/2018   Left NORMAL.  Right 1-39% ICA stenosis, with <50% distal CCA stenosis (not hemodynamically significant)   LE venous dopplers Bilateral 03/06/2022   no dvt   RIGHT HEART CATH N/A 02/19/2022   Marked volume overload, R>>L HF, preserved CO. Procedure: RIGHT HEART CATH;  Surgeon: Jolaine Artist, MD;  Location: [place] [facility];  Service: Cardiovascular;  Laterality: N/A;   TRANSTHORACIC ECHOCARDIOGRAM  11/2007; 09/2014; 11/2015;12/2017   LV fxn normal, EF normal, mild dilation of left atrium.  2015 grade II diast dysfxn.  2017 EF 55-60%. 2019: LVEF 60-65%, mild RV syst dysf,biatrial enlgmt, mod pulm htn. 02/14/22 EF 55-60%, unable to assess diast fxn, LVH, o/w normal   URETERAL STENT PLACEMENT     virtual colonoscopy  01/2011   Normal    Allergies  Allergies  Allergen Reactions   Other Other (See Comments)    Sneezing, coughing   Hydralazine Other (See Comments)    Drowsiness/sedation/mental fog    History of Present Illness    [name] is a 71 y.o. 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  Admitted 02/2022 with acute on chronic heart failure.  He noted eating a lot of high sodium meals while staying in a hotel waiting to move to a new apartment.  Diuresed with IV Lasix and treated for UTI.  Echo with EF 55 to 60%, RV not well visualized.  RHC with RA 18, PA 63/19 (35), Fick CO/Cl 10.5/3.8, Thermo CO/Cl 7.2/2.6, PVR 1.9 WU  (thermo), PAPi 2.4.  There is suspected pulmonary hypertension likely due to combination of left heart disease and primary pulmonary hypertension related to untreated OSA and obesity.  He had a 78 pound weight loss during admission.  Discharged on torsemide 40 mg daily.  Discharge weight 333 pounds.  Initial TOC visit had gained 20pounds at SNF. Torsemide increased to 80mg  QD. Seen 04/05/22 still volume overloaded. Recommended take dose of metolazone for 2 days then transition to once per week with potassium.   He presents today for follow up independently. Remains at SNF and slow progress in mobility, unable to stand to weigh.  Notes waking feeling tired and we discussed sleep study when he is able to stand and mobilize to bed for in-lab sleep study. Tells me he hurt his left shoulder in OT and did an x-ray with no noted injury. Still ender from elbow up to shoulder. Awaiting appointment with orthopedics related to his knees and significant OA per his report. Plan is to get strong enough to return to his home which is an independent living per his report. Does note feeling depressed at lack of mobility and interested in additional services. No chest pain. Notes his exertional dyspnea is unchanged. He has bilateral LE Unna boots and notes improvement in edema. Per review of SNF records he is taking Metolazone 2.5mg  QD and Torsemide 80mg  QD. He describes as taking at the same time. Discussed ideal dosing of Metolaozne 30 minutes prior to Torsemide. Does note he sometimes does not take all of his medications in the morning and holds onto his fluid pill until lunc time to be able to participate in PT.   EKGs/Labs/Other Studies Reviewed:   The following studies were reviewed today:  EKG:  No EKG today.  Recent Labs: 02/27/2022: Magnesium 2.2 02/28/2022: ALT 26 04/05/2022: B Natriuretic Peptide 271.6 04/11/2022: BUN 28; Creatinine, Ser 1.79; Hemoglobin 11.5; Platelets 521.0; Potassium 4.2; Sodium 135; TSH 5.86   Recent Lipid Panel    Component Value Date/Time   CHOL 101 04/04/2021 0844   TRIG 61 04/04/2021 0844   HDL 41 04/04/2021 0844   CHOLHDL 2.5 04/04/2021 0844   VLDL 12.2 07/12/2020 1015   LDLCALC 46 04/04/2021 0844    Risk Assessment/Calculations:   CHA2DS2-VASc Score = 4   This indicates a 4.8% annual risk of stroke. The patient's score is based upon: CHF History: 1 HTN History: 1 Diabetes History: 1 Stroke History: 0 Vascular Disease History: 0 Age Score: 1 Gender Score: 0     Home Medications   No outpatient medications have been marked as taking for the 04/16/22 encounter (Office Visit) with Loel Dubonnet, NP.     Review of Systems      All other systems reviewed and are otherwise negative except as noted above.  Physical Exam    VS:  BP (!) 74/52 Comment: wrist BP  Pulse 73   Ht 5\' 10"  (1.778 m)  Wt (!) 315 lb (142.9 kg) Comment: weighed at facility this morning  BMI 45.20 kg/m  , BMI Body mass index is 45.2 kg/m.  Wt Readings from Last 3 Encounters:  04/16/22 (!) 315 lb (142.9 kg)  04/11/22 (!) 324 lb 9.6 oz (147.2 kg)  03/07/22 (!) 328 lb 7.8 oz (149 kg)     GEN: Well nourished, overweight, well developed, in no acute distress. HEENT: normal. Neck: Supple, no JVD, carotid bruits, or masses. Cardiac: IRIR, no murmurs, rubs, or gallops. No clubbing, cyanosis, edema.  Radials/PT 2+ and equal bilaterally.  Respiratory:  Respirations regular and unlabored, clear to auscultation bilaterally. GI: Soft, nontender, nondistended. MS: No deformity or atrophy. Skin: Warm and dry, no rash.  Bilateral lower extremity Unna boots in place. Neuro:  Strength and sensation are intact. Psych: Normal affect.  Assessment & Plan    HFpEF / Right sided heart failure / Pulmonary hypertension -admission 02/2022 diuresed 78 pounds.  Volume status difficult to ascertain due to body habitus.  Weight down 9 pounds since clinic visit 5 days ago. Weight today 315 lbs with  prior hospital discharge weight 333 lbs. Continue torsemide 80 mg daily.  Per [facility] clinic he is to be taking metolazone once per week.  However on review of meds from SNF he is taking daily. Concerned that daily metolazone could be detrimental to renal function. Labs from SNF unfortunately not available for my review. As such, will decrease metolazone to 2.5 mg twice per week 30 minutes prior to his torsemide dosing.  To prevent hyperkalemia we will reduce potassium to 20 mEq from QD to 3 times per week. BNP, BMP in 1 week at SNF and I have asked them to fax results to our office.  Continue low-sodium diet, fluid restriction.  Continue daily weights at SNF.   Snores - Sleep study when discharged from SNF. Given RHC and body habitus, plan for in lab split night sleep study. Coordinate at follow up as still at SNF and mobility not yet adequate to transfer to bed independently.    Longstanding persistent atrial fibrillation -asymptomatic in regards to atrial fibrillation with no palpitations.  Rate controlled today continue diltiazem 120 mg daily.  Continue Eliquis 5 mg twice daily.  Denies bleeding complications.  Does not meet dose reduction criteria.  Ask SNF to collect CBC in 1 week for monitoring.  CKDIIIb - Careful titration of diuretic and antihypertensive.  Asked SNF to collect BMP in 1 week.   DM2 - Currently managed at SNF. Insulin dependent.   HTN - Now with relative hypotension. Mild lightheadedness. Discussed possible discontinuation of Imdur but as lightheadedness overall not bothersome he prefers to continue.   Lymphedem with bilateral LE wounds - reports healing well. Bilateral LE with Unna boots in place. No significant edema appreciated on exam.   OA - Limits mobility. Participating in PT at SNF.   Psychosocial needs - Noted with PCP last week that may not have place to stay on discharge from SNF though in clinic today reports hopeful to return home to his independent living.  May  require SQ consult on discharge from SNF to ensure needs met. Can coordinate at follow up as no current plans for discharge. Referred to psychology Dr. Elias Else, PsyD for counseling as reports grief regarding current health circumstances and lack of mobility.   Disposition: Follow up in 2 month(s) with Pixie Casino, MD or APP.  Signed, Loel Dubonnet, NP 04/16/2022, 9:26 AM [facility] 

## 2022-04-16 NOTE — Patient Instructions (Addendum)
Medication Instructions:  Your physician has recommended you make the following change in your medication:   REDUCE Metolazone to 2.5mg  twice per week  *Please provide 30 minutes prior to Torsemide  REDUCE Potassium to 20 mEq three times per week  *If you need a refill on your cardiac medications before your next appointment, please call your pharmacy*  Lab Work: Your physician recommends lab work in 1 week for BMP, CBC at SNF.   Please fax results to [phone]  Testing/Procedures: None ordered today.  Will plan for in-lab sleep study once mobility permits. Likely after discharge from SNF.   Follow-Up: At [facility], you and your health needs are our priority.  As part of our continuing mission to provide you with exceptional heart care, we have created designated Provider Care Teams.  These Care Teams include your primary Cardiologist (physician) and Advanced Practice Providers (APPs -  Physician Assistants and Nurse Practitioners) who all work together to provide you with the care you need, when you need it.  We recommend signing up for the patient portal called "MyChart".  Sign up information is provided on this After Visit Summary.  MyChart is used to connect with patients for Virtual Visits (Telemedicine).  Patients are able to view lab/test results, encounter notes, upcoming appointments, etc.  Non-urgent messages can be sent to your provider as well.   To learn more about what you can do with MyChart, go to [email or web address].    Your next appointment:   2 month(s)  The format for your next appointment:   In Person  Provider:   K. Italy Hilty, MD or Alver Sorrow, NP     Other Instructions  Continue daily weights, low sodium diet, fluid restriction, regular monitoring of vital signs.   You have been referred to psychology today. Mental health is an important part of heart health. You have been referred to Dr. Georgiann Mohs. He is located at [facility]  Primary care at [facility]. They will contact you to schedule an appointment. If you have not heard from them in 1 week, you may call [phone] to schedule an appointment.

## 2022-04-17 ENCOUNTER — Encounter (HOSPITAL_BASED_OUTPATIENT_CLINIC_OR_DEPARTMENT_OTHER): Payer: Self-pay | Admitting: Family

## 2022-04-23 ENCOUNTER — Telehealth (HOSPITAL_BASED_OUTPATIENT_CLINIC_OR_DEPARTMENT_OTHER): Payer: Self-pay | Admitting: Family

## 2022-04-23 ENCOUNTER — Encounter (HOSPITAL_BASED_OUTPATIENT_CLINIC_OR_DEPARTMENT_OTHER): Payer: Self-pay

## 2022-04-23 ENCOUNTER — Encounter (HOSPITAL_COMMUNITY): Payer: Self-pay | Admitting: Emergency Medicine

## 2022-04-23 ENCOUNTER — Emergency Department (HOSPITAL_COMMUNITY): Payer: Medicare Other

## 2022-04-23 ENCOUNTER — Other Ambulatory Visit: Payer: Self-pay

## 2022-04-23 ENCOUNTER — Inpatient Hospital Stay (HOSPITAL_COMMUNITY)
Admission: EM | Admit: 2022-04-23 | Discharge: 2022-04-26 | DRG: 683 | Disposition: A | Discharge status: Skilled Nursing Facility | Payer: Medicare Other | Attending: Family Medicine | Admitting: Family Medicine

## 2022-04-23 DIAGNOSIS — L03115 Cellulitis of right lower limb: Secondary | ICD-10-CM | POA: Diagnosis not present

## 2022-04-23 DIAGNOSIS — G4733 Obstructive sleep apnea (adult) (pediatric): Secondary | ICD-10-CM | POA: Diagnosis present

## 2022-04-23 DIAGNOSIS — Z833 Family history of diabetes mellitus: Secondary | ICD-10-CM | POA: Diagnosis not present

## 2022-04-23 DIAGNOSIS — Z794 Long term (current) use of insulin: Secondary | ICD-10-CM | POA: Diagnosis not present

## 2022-04-23 DIAGNOSIS — Z7901 Long term (current) use of anticoagulants: Secondary | ICD-10-CM

## 2022-04-23 DIAGNOSIS — I5082 Biventricular heart failure: Secondary | ICD-10-CM | POA: Diagnosis present

## 2022-04-23 DIAGNOSIS — N179 Acute kidney failure, unspecified: Secondary | ICD-10-CM | POA: Diagnosis not present

## 2022-04-23 DIAGNOSIS — Z8614 Personal history of Methicillin resistant Staphylococcus aureus infection: Secondary | ICD-10-CM

## 2022-04-23 DIAGNOSIS — E1122 Type 2 diabetes mellitus with diabetic chronic kidney disease: Secondary | ICD-10-CM | POA: Diagnosis not present

## 2022-04-23 DIAGNOSIS — T699XXA Effect of reduced temperature, unspecified, initial encounter: Secondary | ICD-10-CM | POA: Diagnosis not present

## 2022-04-23 DIAGNOSIS — E66813 Obesity, class 3: Secondary | ICD-10-CM

## 2022-04-23 DIAGNOSIS — G2581 Restless legs syndrome: Secondary | ICD-10-CM | POA: Diagnosis present

## 2022-04-23 DIAGNOSIS — I2781 Cor pulmonale (chronic): Secondary | ICD-10-CM | POA: Diagnosis not present

## 2022-04-23 DIAGNOSIS — I4819 Other persistent atrial fibrillation: Secondary | ICD-10-CM | POA: Diagnosis not present

## 2022-04-23 DIAGNOSIS — I890 Lymphedema, not elsewhere classified: Secondary | ICD-10-CM

## 2022-04-23 DIAGNOSIS — J309 Allergic rhinitis, unspecified: Secondary | ICD-10-CM | POA: Diagnosis not present

## 2022-04-23 DIAGNOSIS — E162 Hypoglycemia, unspecified: Secondary | ICD-10-CM | POA: Diagnosis not present

## 2022-04-23 DIAGNOSIS — I5032 Chronic diastolic (congestive) heart failure: Secondary | ICD-10-CM | POA: Diagnosis not present

## 2022-04-23 DIAGNOSIS — Y92009 Unspecified place in unspecified non-institutional (private) residence as the place of occurrence of the external cause: Secondary | ICD-10-CM | POA: Diagnosis not present

## 2022-04-23 DIAGNOSIS — Z79899 Other long term (current) drug therapy: Secondary | ICD-10-CM | POA: Diagnosis not present

## 2022-04-23 DIAGNOSIS — E861 Hypovolemia: Secondary | ICD-10-CM | POA: Diagnosis present

## 2022-04-23 DIAGNOSIS — M25561 Pain in right knee: Secondary | ICD-10-CM | POA: Diagnosis not present

## 2022-04-23 DIAGNOSIS — I5033 Acute on chronic diastolic (congestive) heart failure: Secondary | ICD-10-CM | POA: Diagnosis not present

## 2022-04-23 DIAGNOSIS — I130 Hypertensive heart and chronic kidney disease with heart failure and stage 1 through stage 4 chronic kidney disease, or unspecified chronic kidney disease: Secondary | ICD-10-CM | POA: Diagnosis present

## 2022-04-23 DIAGNOSIS — E1140 Type 2 diabetes mellitus with diabetic neuropathy, unspecified: Secondary | ICD-10-CM | POA: Diagnosis not present

## 2022-04-23 DIAGNOSIS — I5042 Chronic combined systolic (congestive) and diastolic (congestive) heart failure: Secondary | ICD-10-CM | POA: Diagnosis present

## 2022-04-23 DIAGNOSIS — Z888 Allergy status to other drugs, medicaments and biological substances status: Secondary | ICD-10-CM

## 2022-04-23 DIAGNOSIS — Z6841 Body Mass Index (BMI) 40.0 and over, adult: Secondary | ICD-10-CM | POA: Diagnosis not present

## 2022-04-23 DIAGNOSIS — I2729 Other secondary pulmonary hypertension: Secondary | ICD-10-CM | POA: Diagnosis present

## 2022-04-23 DIAGNOSIS — Z7401 Bed confinement status: Secondary | ICD-10-CM | POA: Diagnosis not present

## 2022-04-23 DIAGNOSIS — R739 Hyperglycemia, unspecified: Secondary | ICD-10-CM | POA: Diagnosis not present

## 2022-04-23 DIAGNOSIS — M25562 Pain in left knee: Secondary | ICD-10-CM | POA: Diagnosis not present

## 2022-04-23 DIAGNOSIS — I10 Essential (primary) hypertension: Secondary | ICD-10-CM | POA: Diagnosis not present

## 2022-04-23 DIAGNOSIS — E871 Hypo-osmolality and hyponatremia: Secondary | ICD-10-CM | POA: Diagnosis not present

## 2022-04-23 DIAGNOSIS — N390 Urinary tract infection, site not specified: Secondary | ICD-10-CM | POA: Diagnosis not present

## 2022-04-23 DIAGNOSIS — J9601 Acute respiratory failure with hypoxia: Secondary | ICD-10-CM | POA: Diagnosis not present

## 2022-04-23 DIAGNOSIS — E161 Other hypoglycemia: Secondary | ICD-10-CM | POA: Diagnosis not present

## 2022-04-23 DIAGNOSIS — T502X5A Adverse effect of carbonic-anhydrase inhibitors, benzothiadiazides and other diuretics, initial encounter: Secondary | ICD-10-CM | POA: Diagnosis not present

## 2022-04-23 DIAGNOSIS — L03116 Cellulitis of left lower limb: Secondary | ICD-10-CM | POA: Diagnosis not present

## 2022-04-23 DIAGNOSIS — R2689 Other abnormalities of gait and mobility: Secondary | ICD-10-CM | POA: Diagnosis not present

## 2022-04-23 DIAGNOSIS — N1832 Chronic kidney disease, stage 3b: Secondary | ICD-10-CM

## 2022-04-23 DIAGNOSIS — N19 Unspecified kidney failure: Secondary | ICD-10-CM | POA: Diagnosis not present

## 2022-04-23 DIAGNOSIS — M6281 Muscle weakness (generalized): Secondary | ICD-10-CM | POA: Diagnosis not present

## 2022-04-23 DIAGNOSIS — Z743 Need for continuous supervision: Secondary | ICD-10-CM | POA: Diagnosis not present

## 2022-04-23 DIAGNOSIS — D631 Anemia in chronic kidney disease: Secondary | ICD-10-CM | POA: Diagnosis not present

## 2022-04-23 DIAGNOSIS — E1165 Type 2 diabetes mellitus with hyperglycemia: Secondary | ICD-10-CM | POA: Diagnosis not present

## 2022-04-23 LAB — CBC WITH DIFFERENTIAL/PLATELET
Abs Immature Granulocytes: 0.11 10*3/uL — ABNORMAL HIGH (ref 0.00–0.07)
Basophils Absolute: 0.1 10*3/uL (ref 0.0–0.1)
Basophils Relative: 1 %
Eosinophils Absolute: 0 10*3/uL (ref 0.0–0.5)
Eosinophils Relative: 0 %
HCT: 41.7 % (ref 39.0–52.0)
Hemoglobin: 13.4 g/dL (ref 13.0–17.0)
Immature Granulocytes: 1 %
Lymphocytes Relative: 6 %
Lymphs Abs: 0.6 10*3/uL — ABNORMAL LOW (ref 0.7–4.0)
MCH: 21.9 pg — ABNORMAL LOW (ref 26.0–34.0)
MCHC: 32.1 g/dL (ref 30.0–36.0)
MCV: 68 fL — ABNORMAL LOW (ref 80.0–100.0)
Monocytes Absolute: 0.2 10*3/uL (ref 0.1–1.0)
Monocytes Relative: 2 %
Neutro Abs: 9.3 10*3/uL — ABNORMAL HIGH (ref 1.7–7.7)
Neutrophils Relative %: 90 %
Platelets: 303 10*3/uL (ref 150–400)
RBC: 6.13 MIL/uL — ABNORMAL HIGH (ref 4.22–5.81)
RDW: 21.3 % — ABNORMAL HIGH (ref 11.5–15.5)
WBC: 10.3 10*3/uL (ref 4.0–10.5)
nRBC: 0 % (ref 0.0–0.2)

## 2022-04-23 LAB — GLUCOSE, CAPILLARY
Glucose-Capillary: 473 mg/dL — ABNORMAL HIGH (ref 70–99)
Glucose-Capillary: 479 mg/dL — ABNORMAL HIGH (ref 70–99)

## 2022-04-23 LAB — BASIC METABOLIC PANEL
Anion gap: 15 (ref 5–15)
BUN: 95 mg/dL — ABNORMAL HIGH (ref 8–23)
CO2: 31 mmol/L (ref 22–32)
Calcium: 8.2 mg/dL — ABNORMAL LOW (ref 8.9–10.3)
Chloride: 76 mmol/L — ABNORMAL LOW (ref 98–111)
Creatinine, Ser: 3.01 mg/dL — ABNORMAL HIGH (ref 0.61–1.24)
GFR, Estimated: 21 mL/min — ABNORMAL LOW (ref >60)
Glucose, Bld: 311 mg/dL — ABNORMAL HIGH (ref 70–99)
Potassium: 3.3 mmol/L — ABNORMAL LOW (ref 3.5–5.1)
Sodium: 122 mmol/L — ABNORMAL LOW (ref 135–145)

## 2022-04-23 LAB — BRAIN NATRIURETIC PEPTIDE: B Natriuretic Peptide: 162 pg/mL — ABNORMAL HIGH (ref 0.0–100.0)

## 2022-04-23 LAB — MAGNESIUM: Magnesium: 2 mg/dL (ref 1.7–2.4)

## 2022-04-23 MED ORDER — INSULIN ASPART 100 UNIT/ML IJ SOLN
0.0000 [IU] | Freq: Three times a day (TID) | INTRAMUSCULAR | Status: DC
  Administered 2022-04-24: 15 [IU] via SUBCUTANEOUS

## 2022-04-23 MED ORDER — ONDANSETRON HCL 4 MG/2ML IJ SOLN: 4.0000 mg | Freq: Four times a day (QID) | INTRAMUSCULAR | Status: DC | PRN

## 2022-04-23 MED ORDER — TERAZOSIN HCL 5 MG PO CAPS
10.0000 mg | ORAL_CAPSULE | Freq: Every day | ORAL | Status: DC
  Administered 2022-04-23 – 2022-04-26 (×4): 10 mg via ORAL
  Filled 2022-04-23 (×4): qty 2

## 2022-04-23 MED ORDER — ACETAMINOPHEN 650 MG RE SUPP: 650.0000 mg | Freq: Four times a day (QID) | RECTAL | Status: DC | PRN

## 2022-04-23 MED ORDER — ISOSORBIDE MONONITRATE ER 30 MG PO TB24
15.0000 mg | ORAL_TABLET | Freq: Every day | ORAL | Status: DC
Start: 2022-04-24 — End: 2022-04-24
  Administered 2022-04-24: 15 mg via ORAL
  Filled 2022-04-23: qty 1

## 2022-04-23 MED ORDER — ACETAMINOPHEN 325 MG PO TABS: 650.0000 mg | ORAL_TABLET | Freq: Four times a day (QID) | ORAL | Status: DC | PRN

## 2022-04-23 MED ORDER — INSULIN ASPART PROT & ASPART (70-30 MIX) 100 UNIT/ML ~~LOC~~ SUSP
15.0000 [IU] | Freq: Two times a day (BID) | SUBCUTANEOUS | Status: DC
Start: 2022-04-24 — End: 2022-04-24
  Administered 2022-04-24: 15 [IU] via SUBCUTANEOUS
  Filled 2022-04-23: qty 10

## 2022-04-23 MED ORDER — HYDROCODONE-ACETAMINOPHEN 5-325 MG PO TABS
1.0000 | ORAL_TABLET | Freq: Four times a day (QID) | ORAL | Status: DC | PRN
  Administered 2022-04-23 – 2022-04-26 (×4): 1 via ORAL
  Filled 2022-04-23 (×3): qty 1
  Filled 2022-04-23: qty 2

## 2022-04-23 MED ORDER — TRAMADOL HCL 50 MG PO TABS
50.0000 mg | ORAL_TABLET | Freq: Every day | ORAL | Status: DC
  Administered 2022-04-24 – 2022-04-26 (×3): 50 mg via ORAL
  Filled 2022-04-23 (×3): qty 1

## 2022-04-23 MED ORDER — SODIUM CHLORIDE 0.9 % IV BOLUS
500.0000 mL | Freq: Once | INTRAVENOUS | Status: AC
  Administered 2022-04-23: 500 mL via INTRAVENOUS

## 2022-04-23 MED ORDER — SODIUM CHLORIDE 0.9 % IV SOLN: INTRAVENOUS | Status: DC

## 2022-04-23 MED ORDER — DICLOFENAC SODIUM 1 % EX GEL
2.0000 g | Freq: Four times a day (QID) | CUTANEOUS | Status: DC
  Administered 2022-04-23 – 2022-04-26 (×13): 2 g via TOPICAL
  Filled 2022-04-23: qty 100

## 2022-04-23 MED ORDER — ONDANSETRON HCL 4 MG PO TABS: 4.0000 mg | ORAL_TABLET | Freq: Four times a day (QID) | ORAL | Status: DC | PRN

## 2022-04-23 MED ORDER — INSULIN ASPART 100 UNIT/ML IJ SOLN
0.0000 [IU] | Freq: Every day | INTRAMUSCULAR | Status: DC
  Administered 2022-04-24: 2 [IU] via SUBCUTANEOUS
  Administered 2022-04-25: 4 [IU] via SUBCUTANEOUS
  Administered 2022-04-26: 2 [IU] via SUBCUTANEOUS

## 2022-04-23 MED ORDER — DILTIAZEM HCL ER 120 MG PO CP24
120.0000 mg | ORAL_CAPSULE | Freq: Every day | ORAL | Status: DC
Start: 2022-04-24 — End: 2022-04-24
  Filled 2022-04-23 (×3): qty 1

## 2022-04-23 MED ORDER — INSULIN ASPART 100 UNIT/ML IJ SOLN
17.0000 [IU] | Freq: Once | INTRAMUSCULAR | Status: AC
Start: 2022-04-23 — End: 2022-04-23
  Administered 2022-04-23: 17 [IU] via SUBCUTANEOUS

## 2022-04-23 NOTE — Telephone Encounter (Signed)
Brief HPI Admitted 02/13/2022 - 03/07/2022 acute on chronic diastolic heart failure, severe OA.  He was discharged on torsemide 40 mg daily. 03/29/22 H&V TOC clinic.  Weight was up 20 pounds.  Torsemide increased 80mg  QD. Recommended to take metolazone 2.5 mg the next morning. 04/05/22 H&V TOC clinic. Still volume overloaded. Instructed to take 2.5 mg metolazone and 40 mEq of potassium for the next 2 days then change to once weekly dosing every Wednesday.  Torsemide maintained at 80 mg daily. 04/16/22 clinic visit with me. Med list from facility stated Metolazone 2.5mg  DAILY with Torsemide 80mg  daily. For protection of renal function Metolazone reduced to 2.5mg  twice per week 30 minutes prior to Torsemide - continue Torsemide 80mg  QD - reduce Potassium to 04/18/22 three times per week.   Labs collected 04/23/2022 received from [name] WBC 9.2, hemoglobin 12.0, hematocrit 37 Calcium 7.9, creatinine 2.74, BUN 86, NA 126, K3.1, chloride 79, GFR 24  Labs indicative of acute kidney injury, hypokalemia.  Prior labs 04/11/2022 NA 135, K4.2, creatinine 1.79, GFR 37.78, hemoglobin 11.5, hematocrit 36.9.  Recommend he be evaluated in the emergency department. Will likely require IV hydration as well as potassium supplementation.   04/25/2022, NP

## 2022-04-23 NOTE — Telephone Encounter (Signed)
Advised Candice, verbalized understanding and stated would let provider at facility know

## 2022-04-23 NOTE — Telephone Encounter (Signed)
Per Candice critical labs as follows  BUN 86 Cr 2.74  Chloride 79  Candice will still fax results  Confirmed CBC also done  Call back # [phone]  Will forward to Ronn Melena NP for review

## 2022-04-23 NOTE — Telephone Encounter (Signed)
[name] is calling from [facility] stating they drew [name]'s requesting labs this morning and they came back critical. The BUN is 86 and the Chloride is 79. Please advise.

## 2022-04-23 NOTE — Hospital Course (Signed)
71 year old male with a history of cor pulmonale, CKD stage III, diastolic CHF, persistent atrial fibrillation, diabetes mellitus type 2, hypertension, morbid obesity, OSA presenting with abnormal labs from the [facility].  Notably, the patient was recently admitted to the hospital from 02/13/2022 to 03/07/2022 with acute on chronic diastolic CHF.  The patient diuresed 25 L during that hospital admission.  His discharge weight was 233 pounds.  He was discharged to the [facility] with torsemide 40 mg daily.  Subsequently, the patient followed up with the heart and vascular clinic on 03/29/2022.  Apparently, the patient had gained 20 pounds from 316 to 336 on the scales at the [facility].  The patient had his torsemide increased to 80 mg.  He followed up again at the heart and vascular clinic on 04/05/2022.  He was instructed to take metolazone 2.5 mg for 2 days and then changed to once weekly.  He was maintained on torsemide 80 mg daily.  He followed up once again at the heart and vascular clinic on 04/16/2022, and his medication list noted that he was taking metolazone 2.5 mg daily with torsemide 80 mg daily.  He was instructed to decrease his metolazone to 2 times per week. Repeat blood work performed on 04/23/2022 showed that his serum creatinine was up to 2.74 with sodium 126.  His serum creatinine was 1.90 on 03/06/2022 at the time of discharge.  As result, the patient was instructed go to the emergency department for further evaluation and treatment.  The patient himself states that he has been feeling better.  He denies any shortness of breath.  He states that his orthopnea has improved.  He states that his lower extremity edema is improving.  He feels like he is improving clinically.  He denies any fever, chills, chest pain, shortness breath, nausea, vomiting, diarrhea, abdominal pain.  There is no hematochezia or melena.  There is no dysuria or hematuria. In the ED, the patient was afebrile and  hemodynamically stable with oxygen saturation 98% on room air. WBC 10.3, hemoglobin 13.4, platelets 203,000.  Sodium 122, potassium 3.3, serum creatinine 3.01.  Chest x-ray did not show any pulmonary edema.  The patient was given 500 cc bolus.  He was subsequently started on a 100 cc/h normal saline in the ED.

## 2022-04-23 NOTE — Assessment & Plan Note (Addendum)
The patient has cor pulmonale/right heart failure -Clinically euvolemic -During admission torsemide and metolazone was held -Metolazone was discontinued -Resume torsemide -Daily weight  -Patient states that his last weight was 305 pounds on 04/22/2022 -RHC with RA 18, PA 63/19 (35), Fick CO/Cl 10.5/3.8, Thermo CO/Cl 7.2/2.6, PVR 1.9 WU (thermo), PAPi 2.4 Weight change: 0.446 kg  Today's weight 139.7 kg

## 2022-04-23 NOTE — Assessment & Plan Note (Signed)
Stable - Rate controlled Continue apixaban Continue diltiazem

## 2022-04-23 NOTE — ED Triage Notes (Signed)
Pt BIB [facility] from [place] [facility]. Pt sent for abnormal kidney function and chloride.

## 2022-04-23 NOTE — ED Provider Notes (Addendum)
[facility] EMERGENCY DEPARTMENT Provider Note   CSN: [id number] Arrival date & time: 04/23/22  1523     History  Chief Complaint  Patient presents with   Abnormal Lab    [name] is a 71 y.o. male.  Patient sent in from in tibial rehab by [facility].  Patient apparently had abnormal labs.  Patient has been at the rehab facility for about a month.  Patient is known to have atrial fibrillation is on Eliquis.  And patient is known to have combined congestive heart failure.  And is on to [facility] might 80 mg daily for that.  Patient without any other complaints no nausea no vomiting.  Patient has longstanding bilateral leg swelling.  Patient was admitted April 12 with acute on chronic combined systolic and diastolic congestive heart failure.  And went to rehab following that admission.       Home Medications Prior to Admission medications   Medication Sig Start Date End Date Taking? Authorizing Provider  apixaban (ELIQUIS) 5 MG TABS tablet Take 5 mg by mouth 2 (two) times daily.    [provider]  B Complex Vitamins (VITAMIN B COMPLEX PO)  04/23/22   [provider]  b complex vitamins capsule Take 1 capsule by mouth every morning.    [provider]  cetirizine (ZYRTEC) 10 MG tablet Take 10 mg by mouth at bedtime.    [provider]  diclofenac Sodium (VOLTAREN) 1 % GEL  04/23/22   [provider]  DILT-XR 120 MG 24 hr capsule  04/23/22   [provider]  diltiazem (TIAZAC) 120 MG 24 hr capsule Take 1 capsule (120 mg total) by mouth daily. 09/14/21   McGowen, Maryjean Morn, MD  glucose blood (ONETOUCH ULTRA) test strip CHECK BLOOD SUGAR TWICE  DAILY 11/16/21   McGowen, Maryjean Morn, MD  HYDROcodone-acetaminophen (NORCO/VICODIN) 5-325 MG tablet Take 1-2 tablets by mouth every 6 (six) hours as needed for moderate pain. 1-2 tabs po bid prn pain 03/06/22   Zannie Cove, MD  insulin aspart protamine- aspart (NOVOLOG MIX 70/30) (70-30) 100  UNIT/ML injection Inject 0.3 mLs (30 Units total) into the skin 2 (two) times daily with a meal. 03/06/22   Zannie Cove, MD  Insulin Pen Needle (B-D ULTRAFINE III SHORT PEN) 31G X 8 MM MISC USE 1 PENNEEDLE TWICE DAILY 07/18/21   McGowen, Maryjean Morn, MD  isosorbide mononitrate (IMDUR) 30 MG 24 hr tablet Take 0.5 tablets (15 mg total) by mouth daily. 03/06/22   Zannie Cove, MD  metolazone (ZAROXOLYN) 2.5 MG tablet Take 1 tablet (2.5 mg total) by mouth as directed. Take twice per week thirty minutes prior to Torsemide 04/16/22   Alver Sorrow, NP  Multiple Vitamin (MULTIVITAMIN) tablet Take 1 tablet by mouth daily.    [provider]  nystatin (MYCOSTATIN/NYSTOP) powder Apply 1 application. topically 3 (three) times daily as needed.    [provider]  potassium chloride SA (KLOR-CON M20) 20 MEQ tablet Take one tablet three times per week. 04/16/22   Alver Sorrow, NP  senna-docusate (SENOKOT-S) 8.6-50 MG tablet Take 1 tablet by mouth at bedtime. 03/06/22   Zannie Cove, MD  terazosin (HYTRIN) 10 MG capsule Take 10 mg by mouth at bedtime.    [provider]  Torsemide 40 MG TABS Take 40 mg by mouth daily.    [provider]  traMADol (ULTRAM) 50 MG tablet Take 50 mg by mouth daily at 6 (six) AM.  [provider]  vitamin E 400 UNIT capsule Take 400 Units by mouth every morning. Vitamin E with Selenium    [provider]      Allergies    Other and Hydralazine    Review of Systems   Review of Systems  Constitutional:  Negative for chills and fever.  HENT:  Negative for ear pain and sore throat.   Eyes:  Negative for pain and visual disturbance.  Respiratory:  Negative for cough and shortness of breath.   Cardiovascular:  Positive for leg swelling. Negative for chest pain and palpitations.  Gastrointestinal:  Negative for abdominal pain and vomiting.  Genitourinary:  Negative for dysuria and hematuria.  Musculoskeletal:  Negative  for arthralgias and back pain.  Skin:  Negative for color change and rash.  Neurological:  Negative for seizures and syncope.  All other systems reviewed and are negative.   Physical Exam Updated Vital Signs BP (!) 146/88   Pulse 77   Temp 97.9 F (36.6 C) (Oral)   Resp 12   SpO2 97%  Physical Exam Vitals and nursing note reviewed.  Constitutional:      General: He is not in acute distress.    Appearance: Normal appearance. He is well-developed. He is obese.  HENT:     Head: Normocephalic and atraumatic.     Mouth/Throat:     Mouth: Mucous membranes are dry.  Eyes:     Extraocular Movements: Extraocular movements intact.     Conjunctiva/sclera: Conjunctivae normal.     Pupils: Pupils are equal, round, and reactive to light.  Cardiovascular:     Rate and Rhythm: Normal rate and regular rhythm.     Heart sounds: No murmur heard. Pulmonary:     Effort: Pulmonary effort is normal. No respiratory distress.     Breath sounds: Normal breath sounds.  Abdominal:     Palpations: Abdomen is soft.     Tenderness: There is no abdominal tenderness.  Musculoskeletal:        General: No swelling.     Cervical back: Neck supple.     Right lower leg: Edema present.     Left lower leg: Edema present.     Comments: Chronic skin changes.  Legs are partially wrapped  Skin:    General: Skin is warm and dry.     Capillary Refill: Capillary refill takes less than 2 seconds.  Neurological:     Mental Status: He is alert and oriented to person, place, and time. Mental status is at baseline.  Psychiatric:        Mood and Affect: Mood normal.     ED Results / Procedures / Treatments   Labs (all labs ordered are listed, but only abnormal results are displayed) Labs Reviewed  CBC WITH DIFFERENTIAL/PLATELET - Abnormal; Notable for the following components:      Result Value   RBC 6.13 (*)    MCV 68.0 (*)    MCH 21.9 (*)    RDW 21.3 (*)    Neutro Abs 9.3 (*)    Lymphs Abs 0.6 (*)     Abs Immature Granulocytes 0.11 (*)    All other components within normal limits  BASIC METABOLIC PANEL - Abnormal; Notable for the following components:   Sodium 122 (*)    Potassium 3.3 (*)    Chloride 76 (*)    Glucose, Bld 311 (*)    BUN 95 (*)    Creatinine, Ser 3.01 (*)    Calcium  8.2 (*)    GFR, Estimated 21 (*)    All other components within normal limits  BRAIN NATRIURETIC PEPTIDE  MAGNESIUM    EKG EKG Interpretation  Date/Time:  Tuesday April 23 2022 16:58:43 EDT Ventricular Rate:  78 PR Interval:    QRS Duration: 178 QT Interval:  471 QTC Calculation: 537 R Axis:   203 Text Interpretation: Atrial fibrillation RBBB and LPFB No significant change since last tracing Confirmed by Vanetta Mulders [phone]) on 04/23/2022 5:12:02 PM  Radiology DG Chest Port 1 View  Result Date: 04/23/2022 CLINICAL DATA:  Renal failure EXAM: PORTABLE CHEST 1 VIEW COMPARISON:  02/17/2022 FINDINGS: 3 Normal mediastinum and cardiac silhouette. Normal pulmonary vasculature. No evidence of effusion, infiltrate, or pneumothorax. No acute bony abnormality. IMPRESSION: No acute cardiopulmonary process. Electronically Signed   By: Genevive Bi M.D.   On: 04/23/2022 17:16    Procedures Procedures    Medications Ordered in ED Medications  0.9 %  sodium chloride infusion (has no administration in time range)  sodium chloride 0.9 % bolus 500 mL (500 mLs Intravenous New Bag/Given 04/23/22 1659)    ED Course/ Medical Decision Making/ A&P                           Medical Decision Making Amount and/or Complexity of Data Reviewed Labs: ordered. Radiology: ordered.  Risk Prescription drug management. Decision regarding hospitalization.  CRITICAL CARE Performed by: Vanetta Mulders Total critical care time: 40 minutes Critical care time was exclusive of separately billable procedures and treating other patients. Critical care was necessary to treat or prevent imminent or life-threatening  deterioration. Critical care was time spent personally by me on the following activities: development of treatment plan with patient and/or surrogate as well as nursing, discussions with consultants, evaluation of patient's response to treatment, examination of patient, obtaining history from patient or surrogate, ordering and performing treatments and interventions, ordering and review of laboratory studies, ordering and review of radiographic studies, pulse oximetry and re-evaluation of patient's condition.  Patient CBC no leukocytosis hemoglobin 13.4.  Patient's basic metabolic panel sodium is 122 potassium slightly down at 3.4 glucose 311.  BUN 95 creatinine 3.01.  GFR of 21.  Patient known to have chronic kidney disease stage III.  Chest x-ray no acute pulmonary process.  EKG does not show atrial fibrillation.  But he has a history of it.  Patient is on Eliquis  Patient will be given IV normal saline.  To help correct the hyponatremia and the acute kidney injury.  Patient to receive 500 cc and then 100 cc an hour.  We will contact hospitalist for admission.   Final Clinical Impression(s) / ED Diagnoses Final diagnoses:  Hyponatremia  AKI (acute kidney injury) [facility])    Rx / DC Orders ED Discharge Orders     None         Vanetta Mulders, MD 04/23/22 1737    Vanetta Mulders, MD 04/23/22 1740

## 2022-04-23 NOTE — Assessment & Plan Note (Addendum)
Secondary to volume depletion and overdiuresis -Holding torsemide and metolazone temporarily -Monitoring-improving

## 2022-04-23 NOTE — Assessment & Plan Note (Addendum)
Stable  Continue diltiazem, isosorbide

## 2022-04-23 NOTE — H&P (Signed)
History and Physical    Patient: [name] FIE:[id number] DOB: 1951/07/31 DOA: 04/23/2022 DOS: the patient was seen and examined on 04/23/2022 PCP: [name], MD  Patient coming from: SNF  Chief Complaint:  Chief Complaint  Patient presents with   Abnormal Lab   HPI: [name] is a 71 year old male with a history of cor pulmonale, CKD stage III, diastolic CHF, persistent atrial fibrillation, diabetes mellitus type 2, hypertension, morbid obesity, OSA presenting with abnormal labs from the [facility].  Notably, the patient was recently admitted to the hospital from 02/13/2022 to 03/07/2022 with acute on chronic diastolic CHF.  The patient diuresed 25 L during that hospital admission.  His discharge weight was 233 pounds.  He was discharged to the [facility] with torsemide 40 mg daily.  Subsequently, the patient followed up with the heart and vascular clinic on 03/29/2022.  Apparently, the patient had gained 20 pounds from 316 to 336 on the scales at the [facility].  The patient had his torsemide increased to 80 mg.  He followed up again at the heart and vascular clinic on 04/05/2022.  He was instructed to take metolazone 2.5 mg for 2 days and then changed to once weekly.  He was maintained on torsemide 80 mg daily.  He followed up once again at the heart and vascular clinic on 04/16/2022, and his medication list noted that he was taking metolazone 2.5 mg daily with torsemide 80 mg daily.  He was instructed to decrease his metolazone to 2 times per week. Repeat blood work performed on 04/23/2022 showed that his serum creatinine was up to 2.74 with sodium 126.  His serum creatinine was 1.90 on 03/06/2022 at the time of discharge.  As result, the patient was instructed go to the emergency department for further evaluation and treatment.  The patient himself states that he has been feeling better.  He denies any shortness of breath.  He states that his orthopnea has  improved.  He states that his lower extremity edema is improving.  He feels like he is improving clinically.  He denies any fever, chills, chest pain, shortness breath, nausea, vomiting, diarrhea, abdominal pain.  There is no hematochezia or melena.  There is no dysuria or hematuria. In the ED, the patient was afebrile and hemodynamically stable with oxygen saturation 98% on room air. WBC 10.3, hemoglobin 13.4, platelets 203,000.  Sodium 122, potassium 3.3, serum creatinine 3.01.  Chest x-ray did not show any pulmonary edema.  The patient was given 500 cc bolus.  He was subsequently started on a 100 cc/h normal saline in the ED.  Review of Systems: As mentioned in the history of present illness. All other systems reviewed and are negative. Past Medical History:  Diagnosis Date   ALLERGIC RHINITIS 08/11/2006   ASTHMA 08/11/2006   Bradycardia    Beta blocker d/c'd 2022   Chronic atrial fibrillation (HCC) 08/2014   Chronic combined systolic and diastolic CHF (congestive heart failure) [facility])    Cardiology f/u 12/2017: pt volume overloaded (R heart dysf suspected), BNP very high, lasix increased.  Repeat echo 12/2017: normal LV EF, mild DD, +RV syst dysfxn, mod pulm HTN, biatrial enlgmt.   Chronic constipation    Chronic renal insufficiency, stage 3 (moderate) (HCC)    Colon cancer screening 02/2021   02/2021 Cologuard POS->GI ref   DIABETES MELLITUS, TYPE II 08/11/2006   HYPERTENSION 08/11/2006   Impaired mobility and endurance    MRSA infection 06/2018  LL venous stasis ulcer infected   Normocytic anemia 2016-2019   03/2018 B12 normal, iron ok (ferritin borderline low).   OBESITY, MORBID 12/14/2007   saxenda started 05/2020 by [facility] wt mgmt center   OSA (obstructive sleep apnea)    to get sleep study with [facility]) as of 12/03/2018 consult.   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  [id number] ID started him on  amoxil prophylaxis---ineffective.  End 2018/Jan 2019 penicillin G injections prophyl helpful but pt declined to continue this as of 01/2018 ID f/u.  ID talked him into resuming monthly penicillin G as of 02/2018 f/u.     Restless leg syndrome    Rx'd clonazepam 09/2017 and pt refused to take it after reading the medication's potential side effects.   Venous stasis ulcers of both lower extremities (HCC)    Severe lymphedema.  wound clinic care ongoing as of 01/2018   Past Surgical History:  Procedure Laterality Date   Carotid dopplers  07/23/2018   Left NORMAL.  Right 1-39% ICA stenosis, with <50% distal CCA stenosis (not hemodynamically significant)   LE venous dopplers Bilateral 03/06/2022   no dvt   RIGHT HEART CATH N/A 02/19/2022   Marked volume overload, R>>L HF, preserved CO. Procedure: RIGHT HEART CATH;  Surgeon: Jolaine Artist, MD;  Location: [facility];  Service: Cardiovascular;  Laterality: N/A;   TRANSTHORACIC ECHOCARDIOGRAM  11/2007; 09/2014; 11/2015;12/2017   LV fxn normal, EF normal, mild dilation of left atrium.  2015 grade II diast dysfxn.  2017 EF 55-60%. 2019: LVEF 60-65%, mild RV syst dysf,biatrial enlgmt, mod pulm htn. 02/14/22 EF 55-60%, unable to assess diast fxn, LVH, o/w normal   URETERAL STENT PLACEMENT     virtual colonoscopy  01/2011   Normal   Social History:  reports that he has never smoked. He has never been exposed to tobacco smoke. He has never used smokeless tobacco. He reports that he does not drink alcohol and does not use drugs.  Allergies  Allergen Reactions   Other Other (See Comments)    Sneezing, coughing   Hydralazine Other (See Comments)    Drowsiness/sedation/mental fog    Family History  Problem Relation Age of Onset   Diabetes Mother    Diabetes Sister    Hydrocephalus Sister        NPH    Prior to Admission medications   Medication Sig Start Date End Date Taking? Authorizing Provider  apixaban (ELIQUIS) 5 MG TABS tablet Take  5 mg by mouth 2 (two) times daily.    [provider]  B Complex Vitamins (VITAMIN B COMPLEX PO)  04/23/22   [provider]  b complex vitamins capsule Take 1 capsule by mouth every morning.    [provider]  cetirizine (ZYRTEC) 10 MG tablet Take 10 mg by mouth at bedtime.    [provider]  diclofenac Sodium (VOLTAREN) 1 % GEL  04/23/22   [provider]  DILT-XR 120 MG 24 hr capsule  04/23/22   [provider]  diltiazem (TIAZAC) 120 MG 24 hr capsule Take 1 capsule (120 mg total) by mouth daily. 09/14/21   McGowen, Adrian Blackwater, MD  glucose blood (ONETOUCH ULTRA) test strip CHECK BLOOD SUGAR TWICE  DAILY 11/16/21   McGowen, Adrian Blackwater, MD  HYDROcodone-acetaminophen (NORCO/VICODIN) 5-325 MG tablet Take 1-2 tablets by mouth every 6 (six) hours as needed for moderate pain. 1-2 tabs po  bid prn pain 03/06/22   Domenic Polite, MD  insulin aspart protamine- aspart (NOVOLOG MIX 70/30) (70-30) 100 UNIT/ML injection Inject 0.3 mLs (30 Units total) into the skin 2 (two) times daily with a meal. 03/06/22   Domenic Polite, MD  Insulin Pen Needle (B-D ULTRAFINE III SHORT PEN) 31G X 8 MM MISC USE 1 PENNEEDLE TWICE DAILY 07/18/21   McGowen, Adrian Blackwater, MD  isosorbide mononitrate (IMDUR) 30 MG 24 hr tablet Take 0.5 tablets (15 mg total) by mouth daily. 03/06/22   Domenic Polite, MD  metolazone (ZAROXOLYN) 2.5 MG tablet Take 1 tablet (2.5 mg total) by mouth as directed. Take twice per week thirty minutes prior to Torsemide 04/16/22   Loel Dubonnet, NP  Multiple Vitamin (MULTIVITAMIN) tablet Take 1 tablet by mouth daily.    [provider]  nystatin (MYCOSTATIN/NYSTOP) powder Apply 1 application. topically 3 (three) times daily as needed.    [provider]  potassium chloride SA (KLOR-CON M20) 20 MEQ tablet Take one tablet three times per week. 04/16/22   Loel Dubonnet, NP  senna-docusate (SENOKOT-S) 8.6-50 MG tablet Take 1 tablet by mouth at bedtime.  03/06/22   Domenic Polite, MD  terazosin (HYTRIN) 10 MG capsule Take 10 mg by mouth at bedtime.    [provider]  Torsemide 40 MG TABS Take 40 mg by mouth daily.    [provider]  traMADol (ULTRAM) 50 MG tablet Take 50 mg by mouth daily at 6 (six) AM.    [provider]  vitamin E 400 UNIT capsule Take 400 Units by mouth every morning. Vitamin E with Selenium    [provider]    Physical Exam: Vitals:   04/23/22 1600 04/23/22 1700 04/23/22 1730 04/23/22 1731  BP: 132/77 124/65 (!) 146/88   Pulse: 82 74 77   Resp: 15 17 12    Temp:    97.9 F (36.6 C)  TempSrc:    Oral  SpO2: 90% 93% 97%    GENERAL:  A&O x 3, NAD, well developed, cooperative, follows commands HEENT: [place]/AT, No thrush, No icterus, No oral ulcers Neck:  No neck mass, No meningismus, soft, supple CV: RRR, no S3, no S4, no rub, no JVD Lungs:  CTA, no wheeze, no rhonchi, good air movement Abd: soft/NT +BS, nondistended Ext: 1 + LE edema, no lymphangitis, no cyanosis, no rashes Neuro:  CN II-XII intact, strength 4/5 in RUE, RLE, strength 4/5 LUE, LLE; sensation intact bilateral; no dysmetria; babinski equivocal  Data Reviewed: Data reviewed in history above  Assessment and Plan: * Acute renal failure superimposed on stage 3b chronic kidney disease (HCC) Baseline creatinine 1.4-1.7 -Presented with serum creatinine 3.01 -Secondary to hypovolemia and diuresis -Patient given 500 cc bolus and started on 100 cc/h normal saline -Saline lock after 1.5 L total -A.m. BMP  Hyponatremia Secondary to volume depletion and overdiuresis -Holding torsemide and metolazone temporarily -am BMP  Persistent atrial fibrillation (HCC) Rate controlled Continue apixaban Continue diltiazem  Chronic diastolic CHF (congestive heart failure) ([place]) The patient has cor pulmonale/right heart failure -Clinically euvolemic -Hold torsemide and metolazone temporarily -Patient states that his last  weight was 305 pounds on 04/22/2022 -RHC with RA 18, PA 63/19 (35), Fick CO/Cl 10.5/3.8, Thermo CO/Cl 7.2/2.6, PVR 1.9 WU (thermo), PAPi 2.4  Hypertension, essential Continue diltiazem, isosorbide  Uncontrolled type 2 diabetes mellitus with hyperglycemia, with long-term current use of insulin ([place]) 04/11/2022 hemoglobin A1c 8.1 -NovoLog sliding scale -Start reduced dose 70 3015 units twice  daily  Lymphedema of both lower extremities Change UNNA boots every Wednesday  Obesity, Class III, BMI 40-49.9 (morbid obesity) (HCC) BMI> 40 Lifestyle modification      Advance Care Planning: FULL CODE  Consults: none  Family Communication: none  Severity of Illness: The appropriate patient status for this patient is OBSERVATION. Observation status is judged to be reasonable and necessary in order to provide the required intensity of service to ensure the patient's safety. The patient's presenting symptoms, physical exam findings, and initial radiographic and laboratory data in the context of their medical condition is felt to place them at decreased risk for further clinical deterioration. Furthermore, it is anticipated that the patient will be medically stable for discharge from the hospital within 2 midnights of admission.   Author: Catarina Hartshorn, MD 04/23/2022 6:35 PM  For on call review [email or web address].

## 2022-04-23 NOTE — Assessment & Plan Note (Addendum)
Change UNNA boots every Wednesday -Wound care regimen -subject to be changed amendable to wound care nurse and team.

## 2022-04-23 NOTE — Assessment & Plan Note (Addendum)
04/11/2022 hemoglobin A1c 8.1 -Strict diabetic diet -Resume home regimen

## 2022-04-23 NOTE — Assessment & Plan Note (Addendum)
Baseline creatinine 1.4-1.7 -Presented with serum creatinine 3.01 >>> 1.85 -Secondary to hypovolemia and diuresis -Patient given 500 cc bolus and was on 100 cc/h normal saline -Kidney function has improved

## 2022-04-23 NOTE — Assessment & Plan Note (Addendum)
BMI> 40 Lifestyle modification Strict diabetic cardiac diet Increase activity and weight loss (Recommend referral by PCP to weight loss clinic) Discussed with patient that weight loss is critical for patient's current condition call Reddys and deteriorating joints

## 2022-04-24 DIAGNOSIS — I10 Essential (primary) hypertension: Secondary | ICD-10-CM | POA: Diagnosis not present

## 2022-04-24 DIAGNOSIS — I4819 Other persistent atrial fibrillation: Secondary | ICD-10-CM

## 2022-04-24 DIAGNOSIS — I5032 Chronic diastolic (congestive) heart failure: Secondary | ICD-10-CM | POA: Diagnosis not present

## 2022-04-24 DIAGNOSIS — N179 Acute kidney failure, unspecified: Secondary | ICD-10-CM | POA: Diagnosis not present

## 2022-04-24 DIAGNOSIS — E871 Hypo-osmolality and hyponatremia: Secondary | ICD-10-CM | POA: Diagnosis not present

## 2022-04-24 DIAGNOSIS — E1165 Type 2 diabetes mellitus with hyperglycemia: Secondary | ICD-10-CM

## 2022-04-24 DIAGNOSIS — Z794 Long term (current) use of insulin: Secondary | ICD-10-CM

## 2022-04-24 LAB — GLUCOSE, CAPILLARY
Glucose-Capillary: 234 mg/dL — ABNORMAL HIGH (ref 70–99)
Glucose-Capillary: 358 mg/dL — ABNORMAL HIGH (ref 70–99)
Glucose-Capillary: 421 mg/dL — ABNORMAL HIGH (ref 70–99)
Glucose-Capillary: 421 mg/dL — ABNORMAL HIGH (ref 70–99)

## 2022-04-24 LAB — BASIC METABOLIC PANEL
Anion gap: 12 (ref 5–15)
BUN: 92 mg/dL — ABNORMAL HIGH (ref 8–23)
CO2: 32 mmol/L (ref 22–32)
Calcium: 8.4 mg/dL — ABNORMAL LOW (ref 8.9–10.3)
Chloride: 82 mmol/L — ABNORMAL LOW (ref 98–111)
Creatinine, Ser: 2.3 mg/dL — ABNORMAL HIGH (ref 0.61–1.24)
GFR, Estimated: 30 mL/min — ABNORMAL LOW (ref >60)
Glucose, Bld: 356 mg/dL — ABNORMAL HIGH (ref 70–99)
Potassium: 3.1 mmol/L — ABNORMAL LOW (ref 3.5–5.1)
Sodium: 126 mmol/L — ABNORMAL LOW (ref 135–145)

## 2022-04-24 MED ORDER — DILTIAZEM HCL ER COATED BEADS 120 MG PO CP24
120.0000 mg | ORAL_CAPSULE | Freq: Every day | ORAL | Status: DC
  Administered 2022-04-24 – 2022-04-26 (×3): 120 mg via ORAL
  Filled 2022-04-24 (×3): qty 1

## 2022-04-24 MED ORDER — INSULIN ASPART 100 UNIT/ML IJ SOLN
0.0000 [IU] | Freq: Three times a day (TID) | INTRAMUSCULAR | Status: DC
  Administered 2022-04-24: 5 [IU] via SUBCUTANEOUS
  Administered 2022-04-25 (×2): 11 [IU] via SUBCUTANEOUS
  Administered 2022-04-25: 15 [IU] via SUBCUTANEOUS
  Administered 2022-04-26 (×3): 11 [IU] via SUBCUTANEOUS

## 2022-04-24 MED ORDER — ASPIRIN 325 MG PO TABS
325.0000 mg | ORAL_TABLET | Freq: Every evening | ORAL | Status: DC | PRN
  Administered 2022-04-24: 325 mg via ORAL
  Filled 2022-04-24 (×2): qty 1

## 2022-04-24 MED ORDER — INSULIN ASPART 100 UNIT/ML IJ SOLN
20.0000 [IU] | Freq: Once | INTRAMUSCULAR | Status: AC
Start: 2022-04-24 — End: 2022-04-24
  Administered 2022-04-24: 20 [IU] via SUBCUTANEOUS

## 2022-04-24 MED ORDER — SODIUM CHLORIDE 0.9 % IV SOLN: INTRAVENOUS | Status: AC

## 2022-04-24 MED ORDER — INSULIN ASPART PROT & ASPART (70-30 MIX) 100 UNIT/ML ~~LOC~~ SUSP
25.0000 [IU] | Freq: Two times a day (BID) | SUBCUTANEOUS | Status: DC
  Administered 2022-04-24 – 2022-04-25 (×3): 25 [IU] via SUBCUTANEOUS
  Filled 2022-04-24: qty 10

## 2022-04-24 MED ORDER — APIXABAN 5 MG PO TABS
5.0000 mg | ORAL_TABLET | Freq: Two times a day (BID) | ORAL | Status: DC
  Administered 2022-04-24 – 2022-04-26 (×6): 5 mg via ORAL
  Filled 2022-04-24 (×6): qty 1

## 2022-04-24 MED ORDER — POTASSIUM CHLORIDE CRYS ER 20 MEQ PO TBCR
40.0000 meq | EXTENDED_RELEASE_TABLET | Freq: Once | ORAL | Status: AC
  Administered 2022-04-24: 40 meq via ORAL
  Filled 2022-04-24: qty 2

## 2022-04-24 NOTE — TOC Initial Note (Addendum)
Transition of Care [facility]) - Initial/Assessment Note    Patient Details  Name: [name] MRN: [id number] Date of Birth: 02-08-1951  Transition of Care [facility]) CM/SW Contact:    Karn Cassis, LCSW Phone Number: 04/24/2022, 8:44 AM  Clinical Narrative: Pt admitted due to acute renal failure superimposed on stage 3b chronic kidney disease. Pt reports he has been a resident at [facility] and [facility] for the past month. He is hopeful to return home after rehab. Per Revonda Standard at facility, okay to return. No FL2/auth needed if observation <24 hours. TOC will follow and start authorization if needed.                  Update 1045- Possible d/c tomorrow. MD notified PT eval needed and will start auth once completed. Facility updated.   Expected Discharge Plan: Skilled Nursing Facility Barriers to Discharge: Continued Medical Work up   Patient Goals and CMS Choice Patient states their goals for this hospitalization and ongoing recovery are:: return to SNF   Choice offered to / list presented to : Patient  Expected Discharge Plan and Services Expected Discharge Plan: Skilled Nursing Facility In-house Referral: Clinical Social Work   Post Acute Care Choice: Resumption of Svcs/PTA Provider Living arrangements for the past 2 months: Skilled Nursing Facility                                      Prior Living Arrangements/Services Living arrangements for the past 2 months: Skilled Nursing Facility Lives with:: Facility Resident Patient language and need for interpreter reviewed:: Yes        Need for Family Participation in Patient Care: No (Comment)        Activities of Daily Living Home Assistive Devices/Equipment: Eyeglasses, Environmental consultant (specify type), Cane (specify quad or straight) ADL Screening (condition at time of admission) Patient's cognitive ability adequate to safely complete daily activities?: Yes Is the patient deaf or have  difficulty hearing?: No Does the patient have difficulty seeing, even when wearing glasses/contacts?: No Does the patient have difficulty concentrating, remembering, or making decisions?: No Patient able to express need for assistance with ADLs?: Yes Does the patient have difficulty dressing or bathing?: No Independently performs ADLs?: Yes (appropriate for developmental age) Does the patient have difficulty walking or climbing stairs?: Yes Weakness of Legs: None Weakness of Arms/Hands: None  Permission Sought/Granted         Permission granted to share info w AGENCY: [facility]  Permission granted to share info w Relationship: SNF     Emotional Assessment     Affect (typically observed): Appropriate Orientation: : Oriented to Self, Oriented to Place, Oriented to  Time, Oriented to Situation Alcohol / Substance Use: Not Applicable Psych Involvement: No (comment)  Admission diagnosis:  Hyponatremia [E87.1] Acute on chronic renal failure (HCC) [N17.9, N18.9] AKI (acute kidney injury) (HCC) [N17.9] Patient Active Problem List   Diagnosis Date Noted   Acute renal failure superimposed on stage 3b chronic kidney disease (HCC) 04/23/2022   Hyponatremia 04/23/2022   Pressure injury of skin 03/07/2022   Acute kidney injury superimposed on chronic kidney disease (HCC)    Obesity, Class III, BMI 40-49.9 (morbid obesity) (HCC)    Acute on chronic diastolic CHF (congestive heart failure) (HCC) 02/13/2022   Uncontrolled type 2 diabetes mellitus with hyperglycemia (HCC) 03/04/2018   Chronic diastolic CHF (congestive heart failure) (HCC)  12/24/2016   Lymphedema 02/22/2016   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   Chronic headaches 05/10/2015   Equivocal stress test 10/24/2014   Persistent atrial fibrillation (HCC) 08/15/2014   Healthcare maintenance 08/15/2014   Type 2 diabetes mellitus with diabetic neuropathy (HCC) 08/15/2014    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   Recurrent cellulitis of lower extremity 08/02/2013   Lymphedema of both lower extremities 11/04/2008   Morbid obesity due to excess calories (HCC) 12/14/2007   Venous (peripheral) insufficiency 07/22/2007   Uncontrolled type 2 diabetes mellitus with hyperglycemia, with long-term current use of insulin (HCC) 08/11/2006   Hypertension, essential 08/11/2006   ALLERGIC RHINITIS 08/11/2006   ASTHMA 08/11/2006   Asthma 08/11/2006   PCP:  [name], MD Pharmacy:   [facility] [address], [place] - [phone] [place] [address] Phone: [phone] Fax: [phone]  [facility] ([facility] ) - [place], [place] - [address] [place] [address] Phone: [phone] Fax: [phone]     Social Determinants of Health (SDOH) Interventions    Readmission Risk Interventions    02/15/2022    2:32 PM  Readmission Risk Prevention Plan  Transportation Screening Complete  PCP or Specialist Appt within 5-7 Days Complete

## 2022-04-24 NOTE — Plan of Care (Signed)
  Problem: Acute Rehab PT Goals(only PT should resolve) Goal: Patient Will Transfer Sit To/From Stand Outcome: Progressing Flowsheets (Taken 04/24/2022 1507) Patient will transfer sit to/from stand:  from elevated surface  with maximum assist Goal: Pt Will Transfer Bed To Chair/Chair To Bed Outcome: Progressing Flowsheets (Taken 04/24/2022 1507) Pt will Transfer Bed to Chair/Chair to Bed: (with slide board bed to manual wheelchair)  min guard assist  Other (comment) Goal: Pt/caregiver will Perform Home Exercise Program Outcome: Progressing Flowsheets (Taken 04/24/2022 1507) Pt/caregiver will Perform Home Exercise Program:  For increased strengthening  For increased ROM  With Supervision, verbal cues required/provided   Katina Dung. Hartnett-Rands, MS, PT [facility] PT [facility] [phone] 04/24/2022

## 2022-04-24 NOTE — Progress Notes (Signed)
PROGRESS NOTE    Patient: [name]                            PCP: [name], MD                    DOB: November 17, 1950            DOA: 04/23/2022 PTW:[id number]             DOS: 04/24/2022, 12:21 PM   LOS: 0 days   Date of Service: The patient was seen and examined on 04/24/2022  Subjective:   The patient was seen and examined this morning. Stable at this time. Still complaining of knee pain, joint pain, Otherwise no issues overnight .  Brief Narrative:   71 year old male with a history of cor pulmonale, CKD stage III, diastolic CHF, persistent atrial fibrillation, diabetes mellitus type 2, hypertension, morbid obesity, OSA presenting with abnormal labs from the [facility].  Notably, the patient was recently admitted to the hospital from 02/13/2022 to 03/07/2022 with acute on chronic diastolic CHF.  The patient diuresed 25 L during that hospital admission.  His discharge weight was 233 pounds.  He was discharged to the [facility] with torsemide 40 mg daily.  Subsequently, the patient followed up with the heart and vascular clinic on 03/29/2022.  Apparently, the patient had gained 20 pounds from 316 to 336 on the scales at the [facility].  The patient had his torsemide increased to 80 mg.  He followed up again at the heart and vascular clinic on 04/05/2022.  He was instructed to take metolazone 2.5 mg for 2 days and then changed to once weekly.  He was maintained on torsemide 80 mg daily.  He followed up once again at the heart and vascular clinic on 04/16/2022, and his medication list noted that he was taking metolazone 2.5 mg daily with torsemide 80 mg daily.  He was instructed to decrease his metolazone to 2 times per week. Repeat blood work performed on 04/23/2022 showed that his serum creatinine was up to 2.74 with sodium 126.  His serum creatinine was 1.90 on 03/06/2022 at the time of discharge.  As result, the patient was instructed go to the emergency department for  further evaluation and treatment.  The patient himself states that he has been feeling better.  He denies any shortness of breath.  He states that his orthopnea has improved.  He states that his lower extremity edema is improving.  He feels like he is improving clinically.  He denies any fever, chills, chest pain, shortness breath, nausea, vomiting, diarrhea, abdominal pain.  There is no hematochezia or melena.  There is no dysuria or hematuria. In the ED, the patient was afebrile and hemodynamically stable with oxygen saturation 98% on room air. WBC 10.3, hemoglobin 13.4, platelets 203,000.  Sodium 122, potassium 3.3, serum creatinine 3.01.  Chest x-ray did not show any pulmonary edema.  The patient was given 500 cc bolus.  He was subsequently started on a 100 cc/h normal saline in the ED.    Assessment & Plan:   Principal Problem:   Acute renal failure superimposed on stage 3b chronic kidney disease (HCC) Active Problems:   Persistent atrial fibrillation (HCC)   Hyponatremia   Chronic diastolic CHF (congestive heart failure) (HCC)   Uncontrolled type 2 diabetes mellitus with hyperglycemia, with long-term current use of insulin (HCC)   Hypertension,  essential   Lymphedema of both lower extremities   Obesity, Class III, BMI 40-49.9 (morbid obesity) (HCC)     Assessment and Plan: * Acute renal failure superimposed on stage 3b chronic kidney disease (HCC) Baseline creatinine 1.4-1.7 -Presented with serum creatinine 3.01 -Secondary to hypovolemia and diuresis -Patient given 500 cc bolus and started on 100 cc/h normal saline -Resuming normal saline at 100 for few more hours  Hyponatremia Secondary to volume depletion and overdiuresis -Holding torsemide and metolazone temporarily -Monitoring-improving  Persistent atrial fibrillation (HCC) Stable - Rate controlled Continue apixaban Continue diltiazem  Chronic diastolic CHF (congestive heart failure) ([place]) The patient has cor  pulmonale/right heart failure -Clinically euvolemic -Hold torsemide and metolazone temporarily till 6/22 -Patient states that his last weight was 305 pounds on 04/22/2022 -RHC with RA 18, PA 63/19 (35), Fick CO/Cl 10.5/3.8, Thermo CO/Cl 7.2/2.6, PVR 1.9 WU (thermo), PAPi 2.4  Hypertension, essential Stable  Continue diltiazem, isosorbide  Uncontrolled type 2 diabetes mellitus with hyperglycemia, with long-term current use of insulin ([place]) 04/11/2022 hemoglobin A1c 8.1 Hyperglycemic today -NovoLog sliding scale-increasing to moderate dose -Increasing his NovoLog mix 70/30 from 15 to 25 units twice daily   Lymphedema of both lower extremities Change UNNA boots every Wednesday  Obesity, Class III, BMI 40-49.9 (morbid obesity) (HCC) BMI> 40 Lifestyle modification   ----------------------------------------------------------------------------------------------------------------------------------------------- Nutritional status:  The patient's BMI is: Body mass index is 44.44 kg/m. I agree with the assessment and plan as outlined ...  Skin Assessment: I have examined the patient's skin and I agree with the wound assessment as performed by wound care team As outlined belowe: Pressure Injury 03/06/22 Buttocks Left Stage 2 -  Partial thickness loss of dermis presenting as a shallow open injury with a red, pink wound bed without slough. (Active)  03/06/22 0100  Location: Buttocks  Location Orientation: Left  Staging: Stage 2 -  Partial thickness loss of dermis presenting as a shallow open injury with a red, pink wound bed without slough.  Wound Description (Comments):   Present on Admission:    ------------------------------------------------------------------------------------------------------------------------------------------  DVT prophylaxis:   apixaban (ELIQUIS) tablet 5 mg   Code Status:   Code Status: Full Code  Family Communication: No family member present at bedside-  attempt will be made to update daily The above findings and plan of care has been discussed with patient (and family)  in detail,  they expressed understanding and agreement of above. -Advance care planning has been discussed.   Admission status:   Status is: Observation The patient remains OBS appropriate and will d/c before 2 midnights.     Procedures:   No admission procedures for hospital encounter.   Antimicrobials:  Anti-infectives (From admission, onward)    None        Medication:   apixaban  5 mg Oral BID   diclofenac Sodium  2 g Topical QID   diltiazem  120 mg Oral Daily   insulin aspart  0-15 Units Subcutaneous TID WC   insulin aspart  0-5 Units Subcutaneous QHS   insulin aspart  20 Units Subcutaneous Once   insulin aspart protamine- aspart  25 Units Subcutaneous BID WC   terazosin  10 mg Oral QHS   traMADol  50 mg Oral Q0600    acetaminophen **OR** acetaminophen, HYDROcodone-acetaminophen, ondansetron **OR** ondansetron (ZOFRAN) IV   Objective:   Vitals:   04/23/22 2113 04/24/22 0137 04/24/22 0500 04/24/22 0538  BP: (!) 122/91 112/75  137/71  Pulse: 76 90  77  Resp: 18 17  16  Temp: 98.3 F (36.8 C) 98.2 F (36.8 C)  98.1 F (36.7 C)  TempSrc:  Oral    SpO2: 93% 95%  92%  Weight:   (!) 140.5 kg   Height:        Intake/Output Summary (Last 24 hours) at 04/24/2022 1221 Last data filed at 04/24/2022 0900 Gross per 24 hour  Intake 720 ml  Output --  Net 720 ml   Filed Weights   04/23/22 1849 04/24/22 0500  Weight: (!) 139.3 kg (!) 140.5 kg     Examination:   Physical Exam  Constitution:  Alert, cooperative, no distress,  Appears calm and comfortable  Psychiatric:   Normal and stable mood and affect, cognition intact,   HEENT:        Normocephalic, PERRL, otherwise with in Normal limits  Chest:         Chest symmetric Cardio vascular:  S1/S2, RRR, No murmure, No Rubs or Gallops  pulmonary: Clear to auscultation bilaterally,  respirations unlabored, negative wheezes / crackles Abdomen: Soft, non-tender, non-distended, bowel sounds,no masses, no organomegaly Muscular skeletal: Limited exam - in bed, able to move all 4 extremities,   Neuro: CNII-XII intact. , normal motor and sensation, reflexes intact  Extremities: No pitting edema lower extremities, +2 pulses  Skin: Dry, warm to touch, negative for any Rashes, No open wounds Wounds: per nursing documentation   ------------------------------------------------------------------------------------------------------------------------------------------    LABs:     Latest Ref Rng & Units 04/23/2022    4:07 PM 04/11/2022    1:48 PM 03/05/2022    5:40 AM  CBC  WBC 4.0 - 10.5 K/uL 10.3  10.1  10.1   Hemoglobin 13.0 - 17.0 g/dL 56.3  87.5  64.3   Hematocrit 39.0 - 52.0 % 41.7  36.9  31.8   Platelets 150 - 400 K/uL 303  521.0  437       Latest Ref Rng & Units 04/24/2022    4:31 AM 04/23/2022    4:07 PM 04/11/2022    1:48 PM  CMP  Glucose 70 - 99 mg/dL 329  518  841   BUN 8 - 23 mg/dL 92  95  28   Creatinine 0.61 - 1.24 mg/dL 6.60  6.30  1.60   Sodium 135 - 145 mmol/L 126  122  135   Potassium 3.5 - 5.1 mmol/L 3.1  3.3  4.2   Chloride 98 - 111 mmol/L 82  76  90   CO2 22 - 32 mmol/L 32  31  33   Calcium 8.9 - 10.3 mg/dL 8.4  8.2  8.9        Micro Results No results found for this or any previous visit (from the past 240 hour(s)).  Radiology Reports DG Chest Port 1 View  Result Date: 04/23/2022 CLINICAL DATA:  Renal failure EXAM: PORTABLE CHEST 1 VIEW COMPARISON:  02/17/2022 FINDINGS: 3 Normal mediastinum and cardiac silhouette. Normal pulmonary vasculature. No evidence of effusion, infiltrate, or pneumothorax. No acute bony abnormality. IMPRESSION: No acute cardiopulmonary process. Electronically Signed   By: Genevive Bi M.D.   On: 04/23/2022 17:16    SIGNED: Kendell Bane, MD, FHM. [facility],  Pager (please use [email or web address] to  page/text) Please use Epic Secure Chat for non-urgent communication (7AM-7PM)  If 7PM-7AM, please contact night-coverage [email or web address], 04/24/2022, 12:21 PM

## 2022-04-24 NOTE — [place] FL2 (Signed)
[place] MEDICAID FL2 LEVEL OF CARE SCREENING TOOL     IDENTIFICATION  Patient Name: Sanjay Broadfoot [name] Birthdate: 05-23-1951 Sex: male Admission Date (Current Location): 04/23/2022  [facility] and [place] Number:  [employer] and Address:  [facility],  [address] [place] [address]      Provider Number: [id number]  Attending Physician Name and Address:  Kendell Bane, MD  Relative Name and Phone Number:       Current Level of Care: Hospital Recommended Level of Care: Skilled Nursing Facility Prior Approval Number:    Date Approved/Denied:   PASRR Number:    Discharge Plan: SNF    Current Diagnoses: Patient Active Problem List   Diagnosis Date Noted   Acute renal failure superimposed on stage 3b chronic kidney disease (HCC) 04/23/2022   Hyponatremia 04/23/2022   Pressure injury of skin 03/07/2022   Acute kidney injury superimposed on chronic kidney disease (HCC)    Obesity, Class III, BMI 40-49.9 (morbid obesity) (HCC)    Acute on chronic diastolic CHF (congestive heart failure) (HCC) 02/13/2022   Uncontrolled type 2 diabetes mellitus with hyperglycemia (HCC) 03/04/2018   Chronic diastolic CHF (congestive heart failure) (HCC) 12/24/2016   Lymphedema 02/22/2016   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   Chronic headaches 05/10/2015   Equivocal stress test 10/24/2014   Persistent atrial fibrillation (HCC) 08/15/2014   Healthcare maintenance 08/15/2014   Type 2 diabetes mellitus with diabetic neuropathy (HCC) 08/15/2014   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   Recurrent cellulitis of lower extremity 08/02/2013   Lymphedema of both lower extremities 11/04/2008   Morbid obesity due to excess calories (HCC) 12/14/2007   Venous (peripheral) insufficiency 07/22/2007   Uncontrolled type 2 diabetes mellitus with hyperglycemia, with long-term current use of insulin (HCC) 08/11/2006   Hypertension, essential 08/11/2006   ALLERGIC  RHINITIS 08/11/2006   ASTHMA 08/11/2006   Asthma 08/11/2006    Orientation RESPIRATION BLADDER Height & Weight     Self, Time, Situation, Place  Normal Continent Weight: (!) 309 lb 11.9 oz (140.5 kg) Height:  5\' 10"  (177.8 cm)  BEHAVIORAL SYMPTOMS/MOOD NEUROLOGICAL BOWEL NUTRITION STATUS      Continent Diet (Heart healthy/carb modified. See d/c summary for updates.)  AMBULATORY STATUS COMMUNICATION OF NEEDS Skin   Extensive Assist Verbally Other (Comment) (Redness/rash to abdomen and thighs.)                       Personal Care Assistance Level of Assistance  Bathing, Dressing, Feeding Bathing Assistance: Maximum assistance Feeding assistance: Limited assistance Dressing Assistance: Maximum assistance     Functional Limitations Info  Sight, Hearing, Speech Sight Info: Impaired Hearing Info: Adequate Speech Info: Adequate    SPECIAL CARE FACTORS FREQUENCY  PT (By licensed PT)     PT Frequency: 5x weekly              Contractures      Additional Factors Info  Code Status, Allergies Code Status Info: Full code Allergies Info: Other, Hydralazine           Current Medications (04/24/2022):  This is the current hospital active medication list Current Facility-Administered Medications  Medication Dose Route Frequency Provider Last Rate Last Admin   0.9 %  sodium chloride infusion   Intravenous Continuous 04/26/2022 A, MD 100 mL/hr at 04/24/22 0851 New Bag at 04/24/22 0851   acetaminophen (TYLENOL) tablet 650 mg  650 mg Oral Q6H PRN 04/26/22, MD  Or   acetaminophen (TYLENOL) suppository 650 mg  650 mg Rectal Q6H PRN Tat, Onalee Hua, MD       diclofenac Sodium (VOLTAREN) 1 % topical gel 2 g  2 g Topical QID Tat, David, MD   2 g at 04/24/22 0849   diltiazem (CARDIZEM CD) 24 hr capsule 120 mg  120 mg Oral Daily Shahmehdi, Seyed A, MD   120 mg at 04/24/22 0845   HYDROcodone-acetaminophen (NORCO/VICODIN) 5-325 MG per tablet 1-2 tablet  1-2 tablet Oral Q6H  PRN Tat, Onalee Hua, MD   1 tablet at 04/23/22 2202   insulin aspart (novoLOG) injection 0-15 Units  0-15 Units Subcutaneous TID WC Tat, Onalee Hua, MD   15 Units at 04/24/22 0843   insulin aspart (novoLOG) injection 0-5 Units  0-5 Units Subcutaneous QHS Tat, Onalee Hua, MD       insulin aspart protamine- aspart (NOVOLOG MIX 70/30) injection 15 Units  15 Units Subcutaneous BID WC Tat, David, MD       isosorbide mononitrate (IMDUR) 24 hr tablet 15 mg  15 mg Oral Daily Tat, David, MD   15 mg at 04/24/22 0844   ondansetron (ZOFRAN) tablet 4 mg  4 mg Oral Q6H PRN Tat, David, MD       Or   ondansetron (ZOFRAN) injection 4 mg  4 mg Intravenous Q6H PRN Tat, David, MD       terazosin (HYTRIN) capsule 10 mg  10 mg Oral Maura Crandall, MD   10 mg at 04/23/22 2201   traMADol (ULTRAM) tablet 50 mg  50 mg Oral Q0600 Catarina Hartshorn, MD   50 mg at 04/24/22 0533     Discharge Medications: Please see discharge summary for a list of discharge medications.  Relevant Imaging Results:  Relevant Lab Results:   Additional Information    Karn Cassis, LCSW

## 2022-04-24 NOTE — Consult Note (Addendum)
WOC Nurse Consult Note: Reason for Consult: Consult requested for inner thigh.  Performed remotely after review of progress notes and photos in the EMR.  Wound type: Inner bilat groin/thighs with red moist macerated partial thickness wounds. Appearance is consistent with moisture associated skin damage. ICD-10 CM Codes for Irritant Dermatitis L30.4  - Erythema intertrigo; used for chafing of the skin, dermatitis due to sweating and friction, friction dermatitis, friction eczema, and genital/thigh intertrigo.   Dressing procedure/placement/frequency: Pt is familiar to [facility] team from previous admission on 5/3 and has worn compression wraps in the past to control edema. I will provide orders for bedside nurses to apply light compression wraps Q day to assist with edema control.  Plan: Topical treatment orders provided for bedside nurses to perform as follows:  1. Apply xeroform gauze to inner right thigh Q day, tuck in place  2. Apply kerlex to bilat legs Q Day, beginning just behind toes to below knees in a spiral fashion, then apply Ace wrap in the same manner  Please re-consult if further assistance is needed.  Thank-you,  Cammie Mcgee MSN, RN, CWOCN, [place], CNS [phone]

## 2022-04-24 NOTE — Evaluation (Signed)
Physical Therapy Evaluation Patient Details Name: [name] MRN: [id number] DOB: 1951-10-30 Today's Date: 04/24/2022  History of Present Illness  71 year old male with a history of cor pulmonale, CKD stage III, diastolic CHF, persistent atrial fibrillation, diabetes mellitus type 2, hypertension, morbid obesity, OSA presenting with abnormal labs from the [facility].  Notably, the patient was recently admitted to the hospital from 02/13/2022 to 03/07/2022 with acute on chronic diastolic CHF.  The patient diuresed 25 L during that hospital admission.  His discharge weight was 233 pounds.  He was discharged to the [facility] with torsemide 40 mg daily.  Subsequently, the patient followed up with the heart and vascular clinic on 03/29/2022.  Apparently, the patient had gained 20 pounds from 316 to 336 on the scales at the [facility].  The patient had his torsemide increased to 80 mg.  He followed up again at the heart and vascular clinic on 04/05/2022.  He was instructed to take metolazone 2.5 mg for 2 days and then changed to once weekly.  He was maintained on torsemide 80 mg daily.  He followed up once again at the heart and vascular clinic on 04/16/2022, and his medication list noted that he was taking metolazone 2.5 mg daily with torsemide 80 mg daily.  He was instructed to decrease his metolazone to 2 times per week.   Clinical Impression  Patient sidelying in bed upon therapist arrival and agreeable to participating in PT evaluation today. Patient reports to PT that he has not ambulated since his admission in to [facility] in March 2023. Patient reports he has been working on [employer] transfers during his time in rehab and does not require much assistance with that now. He reports he was 400# in March 2023 but now is close to 300#. Patient reports any weight bearing is limited by right knee pain and popping, and leg weakness. Today patient performed bed mobility with supervision, for  safety, to modified independent. Patient performed lateral scoots along bedside scooting to the right and left the length of the hospital bed; with supervision for safety. Patient required multiple rest breaks prior to completion of distance left and right. Patient was limited by fatigue.  Patient would continue to benefit from skilled physical therapy in current environment and next venue to continue return to prior function and increase strength, endurance, balance, coordination, and functional mobility and gait skills.       Recommendations for follow up therapy are one component of a multi-disciplinary discharge planning process, led by the attending physician.  Recommendations may be updated based on patient status, additional functional criteria and insurance authorization.  Follow Up Recommendations Skilled nursing-short term rehab (<3 hours/day) Can patient physically be transported by private vehicle: No    Assistance Recommended at Discharge Intermittent Supervision/Assistance  Patient can return home with the following  Two people to help with walking and/or transfers;Help with stairs or ramp for entrance;Assist for transportation;Assistance with cooking/housework;A lot of help with bathing/dressing/bathroom    Equipment Recommendations None recommended by PT  Recommendations for Other Services       Functional Status Assessment Patient has had a recent decline in their functional status and demonstrates the ability to make significant improvements in function in a reasonable and predictable amount of time.     Precautions / Restrictions Precautions Precautions: Fall Restrictions Weight Bearing Restrictions: No      Mobility  Bed Mobility Overal bed mobility: Modified Independent     General bed mobility  comments: increased time and use of bedrails    Transfers Overall transfer level: Needs assistance      Lateral/Scoot Transfers: Supervision General transfer  comment: reports use of slide board for bed to manual wheelchair and return transfers; today lateral scoots perform seated at edge of bed scooting to the right and left the length of the hospital bed; patient required multiple rest breaks prior to completion of distance left and right.    Ambulation/Gait     General Gait Details: patient reports being non-ambulatory since 01/2022  Stairs      Wheelchair Mobility    Modified Rankin (Stroke Patients Only)       Balance Overall balance assessment: Needs assistance Sitting-balance support: No upper extremity supported, Feet supported Sitting balance-Leahy Scale: Good Sitting balance - Comments: seated at EOB           Pertinent Vitals/Pain Pain Assessment Pain Assessment: No/denies pain    Home Living Family/patient expects to be discharged to:: Skilled nursing facility       Prior Function Prior Level of Function : Needs assist       Physical Assist : Mobility (physical) Mobility (physical): Transfers;Gait;Stairs   Mobility Comments: reports has been performing slide board transfers at SNF; non-ambulatory since 3/23       Hand Dominance        Extremity/Trunk Assessment   Upper Extremity Assessment Upper Extremity Assessment: Generalized weakness    Lower Extremity Assessment Lower Extremity Assessment: Generalized weakness       Communication   Communication: No difficulties  Cognition Arousal/Alertness: Awake/alert Behavior During Therapy: WFL for tasks assessed/performed Overall Cognitive Status: Within Functional Limits for tasks assessed        General Comments      Exercises     Assessment/Plan    PT Assessment Patient needs continued PT services  PT Problem List Decreased strength;Decreased mobility;Decreased activity tolerance;Decreased balance;Pain       PT Treatment Interventions DME instruction;Therapeutic exercise;Balance training;Functional mobility training;Therapeutic  activities;Patient/family education;Wheelchair mobility training    PT Goals (Current goals can be found in the Care Plan section)  Acute Rehab PT Goals Patient Stated Goal: Return to rehab before going home. PT Goal Formulation: With patient Time For Goal Achievement: 05/08/22 Potential to Achieve Goals: Fair    Frequency Min 3X/week        AM-PAC PT "6 Clicks" Mobility  Outcome Measure Help needed turning from your back to your side while in a flat bed without using bedrails?: None Help needed moving from lying on your back to sitting on the side of a flat bed without using bedrails?: None Help needed moving to and from a bed to a chair (including a wheelchair)?: A Lot Help needed standing up from a chair using your arms (e.g., wheelchair or bedside chair)?: Total Help needed to walk in hospital room?: Total Help needed climbing 3-5 steps with a railing? : Total 6 Click Score: 13    End of Session   Activity Tolerance: Patient tolerated treatment well;Patient limited by fatigue;Patient limited by pain (R knee pain limiting standing) Patient left: in bed;with call bell/phone within reach Nurse Communication: Mobility status PT Visit Diagnosis: Unsteadiness on feet (R26.81);Other abnormalities of gait and mobility (R26.89);Muscle weakness (generalized) (M62.81);Pain Pain - Right/Left: Right Pain - part of body: Knee    Time: 7062-3762 PT Time Calculation (min) (ACUTE ONLY): 23 min   Charges:   PT Evaluation $PT Eval Low Complexity: 1 Low PT Treatments $Therapeutic Activity: 8-22 mins  Katina Dung. Hartnett-Rands, MS, PT [facility] [phone]  Britta Mccreedy  Hartnett-Rands 04/24/2022, 2:59 PM

## 2022-04-25 ENCOUNTER — Telehealth: Payer: Self-pay

## 2022-04-25 ENCOUNTER — Encounter: Payer: Self-pay | Admitting: Family Medicine

## 2022-04-25 DIAGNOSIS — N179 Acute kidney failure, unspecified: Secondary | ICD-10-CM | POA: Diagnosis present

## 2022-04-25 DIAGNOSIS — E1122 Type 2 diabetes mellitus with diabetic chronic kidney disease: Secondary | ICD-10-CM | POA: Diagnosis present

## 2022-04-25 DIAGNOSIS — Y92009 Unspecified place in unspecified non-institutional (private) residence as the place of occurrence of the external cause: Secondary | ICD-10-CM | POA: Diagnosis not present

## 2022-04-25 DIAGNOSIS — Z794 Long term (current) use of insulin: Secondary | ICD-10-CM | POA: Diagnosis not present

## 2022-04-25 DIAGNOSIS — G4733 Obstructive sleep apnea (adult) (pediatric): Secondary | ICD-10-CM | POA: Diagnosis present

## 2022-04-25 DIAGNOSIS — I5042 Chronic combined systolic (congestive) and diastolic (congestive) heart failure: Secondary | ICD-10-CM | POA: Diagnosis present

## 2022-04-25 DIAGNOSIS — I130 Hypertensive heart and chronic kidney disease with heart failure and stage 1 through stage 4 chronic kidney disease, or unspecified chronic kidney disease: Secondary | ICD-10-CM | POA: Diagnosis present

## 2022-04-25 DIAGNOSIS — E871 Hypo-osmolality and hyponatremia: Secondary | ICD-10-CM | POA: Diagnosis present

## 2022-04-25 DIAGNOSIS — I5082 Biventricular heart failure: Secondary | ICD-10-CM | POA: Diagnosis present

## 2022-04-25 DIAGNOSIS — N1832 Chronic kidney disease, stage 3b: Secondary | ICD-10-CM | POA: Diagnosis present

## 2022-04-25 DIAGNOSIS — I890 Lymphedema, not elsewhere classified: Secondary | ICD-10-CM | POA: Diagnosis present

## 2022-04-25 DIAGNOSIS — I5032 Chronic diastolic (congestive) heart failure: Secondary | ICD-10-CM | POA: Diagnosis not present

## 2022-04-25 DIAGNOSIS — Z8614 Personal history of Methicillin resistant Staphylococcus aureus infection: Secondary | ICD-10-CM | POA: Diagnosis not present

## 2022-04-25 DIAGNOSIS — Z7901 Long term (current) use of anticoagulants: Secondary | ICD-10-CM | POA: Diagnosis not present

## 2022-04-25 DIAGNOSIS — Z6841 Body Mass Index (BMI) 40.0 and over, adult: Secondary | ICD-10-CM | POA: Diagnosis not present

## 2022-04-25 DIAGNOSIS — E1165 Type 2 diabetes mellitus with hyperglycemia: Secondary | ICD-10-CM | POA: Diagnosis present

## 2022-04-25 DIAGNOSIS — I4819 Other persistent atrial fibrillation: Secondary | ICD-10-CM | POA: Diagnosis present

## 2022-04-25 DIAGNOSIS — I2729 Other secondary pulmonary hypertension: Secondary | ICD-10-CM | POA: Diagnosis present

## 2022-04-25 DIAGNOSIS — I2781 Cor pulmonale (chronic): Secondary | ICD-10-CM | POA: Diagnosis present

## 2022-04-25 DIAGNOSIS — Z79899 Other long term (current) drug therapy: Secondary | ICD-10-CM | POA: Diagnosis not present

## 2022-04-25 DIAGNOSIS — E861 Hypovolemia: Secondary | ICD-10-CM | POA: Diagnosis present

## 2022-04-25 DIAGNOSIS — T502X5A Adverse effect of carbonic-anhydrase inhibitors, benzothiadiazides and other diuretics, initial encounter: Secondary | ICD-10-CM | POA: Diagnosis present

## 2022-04-25 DIAGNOSIS — G2581 Restless legs syndrome: Secondary | ICD-10-CM | POA: Diagnosis present

## 2022-04-25 DIAGNOSIS — Z888 Allergy status to other drugs, medicaments and biological substances status: Secondary | ICD-10-CM | POA: Diagnosis not present

## 2022-04-25 DIAGNOSIS — Z833 Family history of diabetes mellitus: Secondary | ICD-10-CM | POA: Diagnosis not present

## 2022-04-25 DIAGNOSIS — I10 Essential (primary) hypertension: Secondary | ICD-10-CM | POA: Diagnosis not present

## 2022-04-25 LAB — BASIC METABOLIC PANEL
Anion gap: 11 (ref 5–15)
BUN: 83 mg/dL — ABNORMAL HIGH (ref 8–23)
CO2: 30 mmol/L (ref 22–32)
Calcium: 8.4 mg/dL — ABNORMAL LOW (ref 8.9–10.3)
Chloride: 87 mmol/L — ABNORMAL LOW (ref 98–111)
Creatinine, Ser: 1.85 mg/dL — ABNORMAL HIGH (ref 0.61–1.24)
GFR, Estimated: 38 mL/min — ABNORMAL LOW (ref >60)
Glucose, Bld: 352 mg/dL — ABNORMAL HIGH (ref 70–99)
Potassium: 3.8 mmol/L (ref 3.5–5.1)
Sodium: 128 mmol/L — ABNORMAL LOW (ref 135–145)

## 2022-04-25 LAB — GLUCOSE, CAPILLARY
Glucose-Capillary: 281 mg/dL — ABNORMAL HIGH (ref 70–99)
Glucose-Capillary: 363 mg/dL — ABNORMAL HIGH (ref 70–99)
Glucose-Capillary: 343 mg/dL — ABNORMAL HIGH (ref 70–99)
Glucose-Capillary: 316 mg/dL — ABNORMAL HIGH (ref 70–99)
Glucose-Capillary: 327 mg/dL — ABNORMAL HIGH (ref 70–99)

## 2022-04-25 NOTE — Telephone Encounter (Signed)
Patient was not happy with results of orthopaedic doctor regarding surgery for knees.  He told patient that "he was fat and too old".  Patient upset.  Would like a 2nd opinion. But patient would like to speak with Dr. Milinda Cave first to listen to his thoughts on what this doctor said to him.  Patient is currently at [facility], they have told him that his kidneys are shutting down.  Please call [phone]

## 2022-04-25 NOTE — Telephone Encounter (Signed)
FYI  Please see below

## 2022-04-25 NOTE — Care Management Important Message (Signed)
Important Message  Patient Details  Name: [name] MRN: [id number] Date of Birth: September 17, 1951   Medicare Important Message Given:  N/A - LOS <3 / Initial given by admissions     Corey Harold 04/25/2022, 11:24 AM

## 2022-04-25 NOTE — Discharge Summary (Signed)
Physician Discharge Summary   Patient: [name] MRN: [id number] DOB: 1951/08/10  Admit date:     04/23/2022  Discharge date: 04/25/22  Discharge Physician: Kendell Bane   PCP: [name], MD   Recommendations at discharge:  The PCP within 1 week, further modification of current medication may be adjusted Close monitoring of kidney function and electrolytes Daily weight Aggressive PT OT, ambulation recommended Continue dressing changes to bilateral lower extremities,-monitor for pressure ulcers Current medication modified-avoid excessive diuresing (acetazolamide discontinued) Diabetic cardiac diet recommended Aggressive weight loss recommended   Discharge Diagnoses: Principal Problem:   Acute renal failure superimposed on stage 3b chronic kidney disease (HCC) Active Problems:   Persistent atrial fibrillation (HCC)   Hyponatremia   Chronic diastolic CHF (congestive heart failure) (HCC)   Uncontrolled type 2 diabetes mellitus with hyperglycemia, with long-term current use of insulin (HCC)   Hypertension, essential   Lymphedema of both lower extremities   Obesity, Class III, BMI 40-49.9 (morbid obesity) (HCC)  Resolved Problems:   * No resolved hospital problems. *  Hospital Course: 71 year old male with a history of cor pulmonale, CKD stage III, diastolic CHF, persistent atrial fibrillation, diabetes mellitus type 2, hypertension, morbid obesity, OSA presenting with abnormal labs from the [facility].  Notably, the patient was recently admitted to the hospital from 02/13/2022 to 03/07/2022 with acute on chronic diastolic CHF.  The patient diuresed 25 L during that hospital admission.  His discharge weight was 233 pounds.  He was discharged to the [facility] with torsemide 40 mg daily.  Subsequently, the patient followed up with the heart and vascular clinic on 03/29/2022.  Apparently, the patient had gained 20 pounds from 316 to 336 on the scales at the  [facility].  The patient had his torsemide increased to 80 mg.  He followed up again at the heart and vascular clinic on 04/05/2022.  He was instructed to take metolazone 2.5 mg for 2 days and then changed to once weekly.  He was maintained on torsemide 80 mg daily.  He followed up once again at the heart and vascular clinic on 04/16/2022, and his medication list noted that he was taking metolazone 2.5 mg daily with torsemide 80 mg daily.  He was instructed to decrease his metolazone to 2 times per week. Repeat blood work performed on 04/23/2022 showed that his serum creatinine was up to 2.74 with sodium 126.  His serum creatinine was 1.90 on 03/06/2022 at the time of discharge.  As result, the patient was instructed go to the emergency department for further evaluation and treatment.  The patient himself states that he has been feeling better.  He denies any shortness of breath.  He states that his orthopnea has improved.  He states that his lower extremity edema is improving.  He feels like he is improving clinically.  He denies any fever, chills, chest pain, shortness breath, nausea, vomiting, diarrhea, abdominal pain.  There is no hematochezia or melena.  There is no dysuria or hematuria. In the ED, the patient was afebrile and hemodynamically stable with oxygen saturation 98% on room air. WBC 10.3, hemoglobin 13.4, platelets 203,000.  Sodium 122, potassium 3.3, serum creatinine 3.01.  Chest x-ray did not show any pulmonary edema.  The patient was given 500 cc bolus.  He was subsequently started on a 100 cc/h normal saline in the ED.  Assessment and Plan: * Acute renal failure superimposed on stage 3b chronic kidney disease (HCC) Baseline creatinine 1.4-1.7 -  Presented with serum creatinine 3.01 >>> 1.85 -Secondary to hypovolemia and diuresis -Patient given 500 cc bolus and was on 100 cc/h normal saline -Kidney function has improved  Hyponatremia Secondary to volume depletion and  overdiuresis -Holding torsemide and metolazone temporarily -Monitoring-improving  Persistent atrial fibrillation (HCC) Stable - Rate controlled Continue apixaban Continue diltiazem  Chronic diastolic CHF (congestive heart failure) (HCC) The patient has cor pulmonale/right heart failure -Clinically euvolemic -During admission torsemide and metolazone was held -Metolazone was discontinued -Resume torsemide -Daily weight  -Patient states that his last weight was 305 pounds on 04/22/2022 -RHC with RA 18, PA 63/19 (35), Fick CO/Cl 10.5/3.8, Thermo CO/Cl 7.2/2.6, PVR 1.9 WU (thermo), PAPi 2.4 Weight change: 0.446 kg  Today's weight 139.7 kg   Hypertension, essential Stable  Continue diltiazem, isosorbide  Uncontrolled type 2 diabetes mellitus with hyperglycemia, with long-term current use of insulin ([place]) 04/11/2022 hemoglobin A1c 8.1 -Strict diabetic diet -Resume home regimen   Lymphedema of both lower extremities Change UNNA boots every Wednesday -Wound care regimen -subject to be changed amendable to wound care nurse and team.  Obesity, Class III, BMI 40-49.9 (morbid obesity) ([place]) BMI> 40 Lifestyle modification Strict diabetic cardiac diet Increase activity and weight loss (Recommend referral by PCP to weight loss clinic) Discussed with patient that weight loss is critical for patient's current condition call Reddys and deteriorating joints         Consultants: None  Procedures performed: None   Disposition: Skilled nursing facility Diet recommendation:  Discharge Diet Orders (From admission, onward)     Start     Ordered   04/25/22 0000  Diet - low sodium heart healthy        04/25/22 0753           Cardiac and Carb modified diet DISCHARGE MEDICATION: Allergies as of 04/25/2022       Reactions   Other Other (See Comments)   Sneezing, coughing   Hydralazine Other (See Comments)   Drowsiness/sedation/mental fog        Medication List     STOP  taking these medications    isosorbide mononitrate 30 MG 24 hr tablet Commonly known as: IMDUR   metolazone 2.5 MG tablet Commonly known as: ZAROXOLYN       TAKE these medications    apixaban 5 MG Tabs tablet Commonly known as: ELIQUIS Take 5 mg by mouth 2 (two) times daily.   VITAMIN B COMPLEX PO   b complex vitamins capsule Take 1 capsule by mouth every morning.   B-D ULTRAFINE III SHORT PEN 31G X 8 MM Misc Generic drug: Insulin Pen Needle USE 1 PENNEEDLE TWICE DAILY   cetirizine 10 MG tablet Commonly known as: ZYRTEC Take 10 mg by mouth at bedtime.   diclofenac Sodium 1 % Gel Commonly known as: VOLTAREN Apply 2 g topically 4 (four) times daily.   Dilt-XR 120 MG 24 hr capsule Generic drug: diltiazem   diltiazem 120 MG 24 hr capsule Commonly known as: Tiazac Take 1 capsule (120 mg total) by mouth daily.   HYDROcodone-acetaminophen 5-325 MG tablet Commonly known as: NORCO/VICODIN Take 1-2 tablets by mouth every 6 (six) hours as needed for moderate pain. 1-2 tabs po bid prn pain   insulin aspart protamine- aspart (70-30) 100 UNIT/ML injection Commonly known as: NOVOLOG MIX 70/30 Inject 0.3 mLs (30 Units total) into the skin 2 (two) times daily with a meal.   multivitamin tablet Take 1 tablet by mouth daily.   nystatin powder Commonly known  as: MYCOSTATIN/NYSTOP Apply 1 application. topically 3 (three) times daily as needed.   OneTouch Ultra test strip Generic drug: glucose blood CHECK BLOOD SUGAR TWICE  DAILY   potassium chloride SA 20 MEQ tablet Commonly known as: Klor-Con M20 Take one tablet three times per week.   senna-docusate 8.6-50 MG tablet Commonly known as: Senokot-S Take 1 tablet by mouth at bedtime.   terazosin 10 MG capsule Commonly known as: HYTRIN Take 10 mg by mouth at bedtime.   Torsemide 40 MG Tabs Take 40 mg by mouth daily.   traMADol 50 MG tablet Commonly known as: ULTRAM Take 50 mg by mouth daily at 6 (six) AM.    vitamin E 180 MG (400 UNITS) capsule Take 400 Units by mouth every morning. Vitamin E with Selenium               Discharge Care Instructions  (From admission, onward)           Start     Ordered   04/25/22 0000  Discharge wound care:       Comments: Continue wound care, recommending wound care follow-up daily Acute 2-hour movement in bed and in chair--- currently has Botox left stage II pressure ulcer Bilateral lower extremity wound care -apply Curlex to bilateral lower extremity daily wrapping all the way to the toes below the knee viral fashion, apply Ace wrap in same manner. -applying Xeroform gauze to inner thigh area daily  (May ignore the above instruction may follow wound care nurse instructions at the facility)   04/25/22 0753   04/25/22 0000  Change dressing (specify)       Comments: Dressing change: 24 to 48 hours..   Times per day using per wound care nurse instructions.   04/25/22 0753            Discharge Exam: Filed Weights   04/23/22 1849 04/24/22 0500 04/25/22 0500  Weight: (!) 139.3 kg (!) 140.5 kg (!) 139.7 kg      Physical Exam:   General:  AAO x 3,  cooperative, no distress; morbidly obese male  HEENT:  Normocephalic, PERRL, otherwise with in Normal limits   Neuro:  CNII-XII intact. , normal motor and sensation, reflexes intact   Lungs:   Clear to auscultation BL, Respirations unlabored,  No wheezes / crackles  Cardio:    S1/S2, RRR, No murmure, No Rubs or Gallops   Abdomen:  Soft, non-tender, bowel sounds active all four quadrants, no guarding or peritoneal signs.  Muscular  skeletal:  Limited exam -global generalized weaknesses Bilateral lower extremity lymphedema Bilateral lower extremity weakness with knee pain therefore limited range of motion difficulty ambulation - in bed, able to move all 4 extremities,   2+ pulses,  symmetric, +2 pitting edema  Skin:  Dry, warm to touch, negative for any Rashes,  Wounds: Please see nursing  documentation  Pressure Injury 03/06/22 Buttocks Left Stage 2 -  Partial thickness loss of dermis presenting as a shallow open injury with a red, pink wound bed without slough. (Active)  03/06/22 0100  Location: Buttocks  Location Orientation: Left  Staging: Stage 2 -  Partial thickness loss of dermis presenting as a shallow open injury with a red, pink wound bed without slough.  Wound Description (Comments):   Present on Admission:           Condition at discharge: fair  The results of significant diagnostics from this hospitalization (including imaging, microbiology, ancillary and laboratory) are listed below for reference.  Imaging Studies: DG Chest Port 1 View  Result Date: 04/23/2022 CLINICAL DATA:  Renal failure EXAM: PORTABLE CHEST 1 VIEW COMPARISON:  02/17/2022 FINDINGS: 3 Normal mediastinum and cardiac silhouette. Normal pulmonary vasculature. No evidence of effusion, infiltrate, or pneumothorax. No acute bony abnormality. IMPRESSION: No acute cardiopulmonary process. Electronically Signed   By: Suzy Bouchard M.D.   On: 04/23/2022 17:16    Microbiology: Results for orders placed or performed during the hospital encounter of 02/13/22  Resp Panel by RT-PCR (Flu A&B, Covid) Nasopharyngeal Swab     Status: None   Collection Time: 02/13/22  6:26 PM   Specimen: Nasopharyngeal Swab; Nasopharyngeal(NP) swabs in vial transport medium  Result Value Ref Range Status   SARS Coronavirus 2 by RT PCR NEGATIVE NEGATIVE Final    Comment: (NOTE) SARS-CoV-2 target nucleic acids are NOT DETECTED.  The SARS-CoV-2 RNA is generally detectable in upper respiratory specimens during the acute phase of infection. The lowest concentration of SARS-CoV-2 viral copies this assay can detect is 138 copies/mL. A negative result does not preclude SARS-Cov-2 infection and should not be used as the sole basis for treatment or other patient management decisions. A negative result may occur with   improper specimen collection/handling, submission of specimen other than nasopharyngeal swab, presence of viral mutation(s) within the areas targeted by this assay, and inadequate number of viral copies(<138 copies/mL). A negative result must be combined with clinical observations, patient history, and epidemiological information. The expected result is Negative.  Fact Sheet for Patients:  EntrepreneurPulse.com.au  Fact Sheet for Healthcare Providers:  IncredibleEmployment.be  This test is no t yet approved or cleared by the [place] FDA and  has been authorized for detection and/or diagnosis of SARS-CoV-2 by FDA under an Emergency Use Authorization (EUA). This EUA will remain  in effect (meaning this test can be used) for the duration of the COVID-19 declaration under Section 564(b)(1) of the Act, 21 U.S.C.section 360bbb-3(b)(1), unless the authorization is terminated  or revoked sooner.       Influenza A by PCR NEGATIVE NEGATIVE Final   Influenza B by PCR NEGATIVE NEGATIVE Final    Comment: (NOTE) The Xpert Xpress SARS-CoV-2/FLU/RSV plus assay is intended as an aid in the diagnosis of influenza from Nasopharyngeal swab specimens and should not be used as a sole basis for treatment. Nasal washings and aspirates are unacceptable for Xpert Xpress SARS-CoV-2/FLU/RSV testing.  Fact Sheet for Patients: EntrepreneurPulse.com.au  Fact Sheet for Healthcare Providers: IncredibleEmployment.be  This test is not yet approved or cleared by the [place] FDA and has been authorized for detection and/or diagnosis of SARS-CoV-2 by FDA under an Emergency Use Authorization (EUA). This EUA will remain in effect (meaning this test can be used) for the duration of the COVID-19 declaration under Section 564(b)(1) of the Act, 21 U.S.C. section 360bbb-3(b)(1), unless the authorization is terminated  or revoked.  Performed at [facility], [place] [address] [place], [place] [address]   Urine Culture     Status: Abnormal   Collection Time: 02/16/22  9:37 AM   Specimen: Urine, Clean Catch  Result Value Ref Range Status   Specimen Description   Final    URINE, CLEAN CATCH Performed at [facility], [place] [address] [place], [place] [address]    Special Requests   Final    NONE Performed at [facility], [place] [address] [place],  [address]    Culture (A)  Final    40,000 COLONIES/mL MULTIPLE  SPECIES PRESENT, SUGGEST RECOLLECTION   Report Status 02/17/2022 FINAL  Final  Body fluid culture w Gram Stain     Status: None   Collection Time: 03/06/22  1:20 PM   Specimen: Synovium; Body Fluid  Result Value Ref Range Status   Specimen Description SYNOVIAL FLUID  Final   Special Requests RIGHT KNEE  Final   Gram Stain   Final    RARE WBC PRESENT,BOTH PMN AND MONONUCLEAR NO ORGANISMS SEEN    Culture   Final    NO GROWTH 3 DAYS Performed at [facility], [address], [place], [place] [address]    Report Status 03/09/2022 FINAL  Final    Labs: CBC: Recent Labs  Lab 04/23/22 1607  WBC 10.3  NEUTROABS 9.3*  HGB 13.4  HCT 41.7  MCV 68.0*  PLT [id number]   Basic Metabolic Panel: Recent Labs  Lab 04/23/22 1607 04/24/22 0431 04/25/22 0504  NA 122* 126* 128*  K 3.3* 3.1* 3.8  CL 76* 82* 87*  CO2 31 32 30  GLUCOSE 311* 356* 352*  BUN 95* 92* 83*  CREATININE 3.01* 2.30* 1.85*  CALCIUM 8.2* 8.4* 8.4*  MG 2.0  --   --    Liver Function Tests: No results for input(s): "AST", "ALT", "ALKPHOS", "BILITOT", "PROT", "ALBUMIN" in the last 168 hours. CBG: Recent Labs  Lab 04/23/22 2359 04/24/22 0708 04/24/22 1117 04/24/22 1648 04/25/22 0715  GLUCAP 421* 358* 421* 234* 363*    Discharge time spent: greater than 40 minutes.  Signed: Deatra James, MD [facility] 04/25/2022

## 2022-04-26 DIAGNOSIS — E161 Other hypoglycemia: Secondary | ICD-10-CM | POA: Diagnosis not present

## 2022-04-26 DIAGNOSIS — M1711 Unilateral primary osteoarthritis, right knee: Secondary | ICD-10-CM | POA: Diagnosis not present

## 2022-04-26 DIAGNOSIS — E1122 Type 2 diabetes mellitus with diabetic chronic kidney disease: Secondary | ICD-10-CM | POA: Diagnosis not present

## 2022-04-26 DIAGNOSIS — I890 Lymphedema, not elsewhere classified: Secondary | ICD-10-CM | POA: Diagnosis not present

## 2022-04-26 DIAGNOSIS — M25562 Pain in left knee: Secondary | ICD-10-CM | POA: Diagnosis not present

## 2022-04-26 DIAGNOSIS — N179 Acute kidney failure, unspecified: Secondary | ICD-10-CM | POA: Diagnosis not present

## 2022-04-26 DIAGNOSIS — Z7401 Bed confinement status: Secondary | ICD-10-CM | POA: Diagnosis not present

## 2022-04-26 DIAGNOSIS — J309 Allergic rhinitis, unspecified: Secondary | ICD-10-CM | POA: Diagnosis not present

## 2022-04-26 DIAGNOSIS — M170 Bilateral primary osteoarthritis of knee: Secondary | ICD-10-CM | POA: Diagnosis not present

## 2022-04-26 DIAGNOSIS — N390 Urinary tract infection, site not specified: Secondary | ICD-10-CM | POA: Diagnosis not present

## 2022-04-26 DIAGNOSIS — L97512 Non-pressure chronic ulcer of other part of right foot with fat layer exposed: Secondary | ICD-10-CM | POA: Diagnosis not present

## 2022-04-26 DIAGNOSIS — T699XXA Effect of reduced temperature, unspecified, initial encounter: Secondary | ICD-10-CM | POA: Diagnosis not present

## 2022-04-26 DIAGNOSIS — G8929 Other chronic pain: Secondary | ICD-10-CM | POA: Diagnosis not present

## 2022-04-26 DIAGNOSIS — N183 Chronic kidney disease, stage 3 unspecified: Secondary | ICD-10-CM | POA: Diagnosis not present

## 2022-04-26 DIAGNOSIS — I5032 Chronic diastolic (congestive) heart failure: Secondary | ICD-10-CM | POA: Diagnosis not present

## 2022-04-26 DIAGNOSIS — N1832 Chronic kidney disease, stage 3b: Secondary | ICD-10-CM | POA: Diagnosis not present

## 2022-04-26 DIAGNOSIS — E871 Hypo-osmolality and hyponatremia: Secondary | ICD-10-CM | POA: Diagnosis not present

## 2022-04-26 DIAGNOSIS — E1140 Type 2 diabetes mellitus with diabetic neuropathy, unspecified: Secondary | ICD-10-CM | POA: Diagnosis not present

## 2022-04-26 DIAGNOSIS — L89142 Pressure ulcer of left lower back, stage 2: Secondary | ICD-10-CM | POA: Diagnosis not present

## 2022-04-26 DIAGNOSIS — I739 Peripheral vascular disease, unspecified: Secondary | ICD-10-CM | POA: Diagnosis not present

## 2022-04-26 DIAGNOSIS — I5033 Acute on chronic diastolic (congestive) heart failure: Secondary | ICD-10-CM | POA: Diagnosis not present

## 2022-04-26 DIAGNOSIS — R2689 Other abnormalities of gait and mobility: Secondary | ICD-10-CM | POA: Diagnosis not present

## 2022-04-26 DIAGNOSIS — Z6841 Body Mass Index (BMI) 40.0 and over, adult: Secondary | ICD-10-CM | POA: Diagnosis not present

## 2022-04-26 DIAGNOSIS — L97322 Non-pressure chronic ulcer of left ankle with fat layer exposed: Secondary | ICD-10-CM | POA: Diagnosis not present

## 2022-04-26 DIAGNOSIS — M79675 Pain in left toe(s): Secondary | ICD-10-CM | POA: Diagnosis not present

## 2022-04-26 DIAGNOSIS — I10 Essential (primary) hypertension: Secondary | ICD-10-CM | POA: Diagnosis not present

## 2022-04-26 DIAGNOSIS — Z743 Need for continuous supervision: Secondary | ICD-10-CM | POA: Diagnosis not present

## 2022-04-26 DIAGNOSIS — M79674 Pain in right toe(s): Secondary | ICD-10-CM | POA: Diagnosis not present

## 2022-04-26 DIAGNOSIS — D631 Anemia in chronic kidney disease: Secondary | ICD-10-CM | POA: Diagnosis not present

## 2022-04-26 DIAGNOSIS — M1712 Unilateral primary osteoarthritis, left knee: Secondary | ICD-10-CM | POA: Diagnosis not present

## 2022-04-26 DIAGNOSIS — J9601 Acute respiratory failure with hypoxia: Secondary | ICD-10-CM | POA: Diagnosis not present

## 2022-04-26 DIAGNOSIS — B351 Tinea unguium: Secondary | ICD-10-CM | POA: Diagnosis not present

## 2022-04-26 DIAGNOSIS — L97519 Non-pressure chronic ulcer of other part of right foot with unspecified severity: Secondary | ICD-10-CM | POA: Diagnosis not present

## 2022-04-26 DIAGNOSIS — Z5181 Encounter for therapeutic drug level monitoring: Secondary | ICD-10-CM | POA: Diagnosis not present

## 2022-04-26 DIAGNOSIS — I5030 Unspecified diastolic (congestive) heart failure: Secondary | ICD-10-CM | POA: Diagnosis not present

## 2022-04-26 DIAGNOSIS — M6281 Muscle weakness (generalized): Secondary | ICD-10-CM | POA: Diagnosis not present

## 2022-04-26 DIAGNOSIS — L97529 Non-pressure chronic ulcer of other part of left foot with unspecified severity: Secondary | ICD-10-CM | POA: Diagnosis not present

## 2022-04-26 DIAGNOSIS — E1165 Type 2 diabetes mellitus with hyperglycemia: Secondary | ICD-10-CM | POA: Diagnosis not present

## 2022-04-26 DIAGNOSIS — E162 Hypoglycemia, unspecified: Secondary | ICD-10-CM | POA: Diagnosis not present

## 2022-04-26 DIAGNOSIS — M25561 Pain in right knee: Secondary | ICD-10-CM | POA: Diagnosis not present

## 2022-04-26 DIAGNOSIS — Z794 Long term (current) use of insulin: Secondary | ICD-10-CM | POA: Diagnosis not present

## 2022-04-26 DIAGNOSIS — L97329 Non-pressure chronic ulcer of left ankle with unspecified severity: Secondary | ICD-10-CM | POA: Diagnosis not present

## 2022-04-26 LAB — BASIC METABOLIC PANEL
Anion gap: 8 (ref 5–15)
BUN: 66 mg/dL — ABNORMAL HIGH (ref 8–23)
CO2: 30 mmol/L (ref 22–32)
Calcium: 8.3 mg/dL — ABNORMAL LOW (ref 8.9–10.3)
Chloride: 93 mmol/L — ABNORMAL LOW (ref 98–111)
Creatinine, Ser: 1.46 mg/dL — ABNORMAL HIGH (ref 0.61–1.24)
GFR, Estimated: 51 mL/min — ABNORMAL LOW (ref >60)
Glucose, Bld: 326 mg/dL — ABNORMAL HIGH (ref 70–99)
Potassium: 4 mmol/L (ref 3.5–5.1)
Sodium: 131 mmol/L — ABNORMAL LOW (ref 135–145)

## 2022-04-26 LAB — GLUCOSE, CAPILLARY
Glucose-Capillary: 336 mg/dL — ABNORMAL HIGH (ref 70–99)
Glucose-Capillary: 341 mg/dL — ABNORMAL HIGH (ref 70–99)
Glucose-Capillary: 330 mg/dL — ABNORMAL HIGH (ref 70–99)
Glucose-Capillary: 203 mg/dL — ABNORMAL HIGH (ref 70–99)

## 2022-04-26 MED ORDER — ASPIRIN 325 MG PO TABS: 325.0000 mg | ORAL_TABLET | Freq: Four times a day (QID) | ORAL | Status: DC | PRN

## 2022-04-26 MED ORDER — INSULIN ASPART PROT & ASPART (70-30 MIX) 100 UNIT/ML ~~LOC~~ SUSP
30.0000 [IU] | Freq: Two times a day (BID) | SUBCUTANEOUS | Status: DC
  Administered 2022-04-26 (×2): 30 [IU] via SUBCUTANEOUS

## 2022-04-26 MED ORDER — CALCIUM CARBONATE 1250 (500 CA) MG PO TABS
1.0000 | ORAL_TABLET | Freq: Two times a day (BID) | ORAL | Status: DC
  Administered 2022-04-26: 500 mg via ORAL
  Filled 2022-04-26: qty 1

## 2022-04-26 MED ORDER — CALCIUM CARBONATE 1250 (500 CA) MG PO TABS: 1.0000 | ORAL_TABLET | Freq: Two times a day (BID) | ORAL | 0 refills | Status: DC

## 2022-04-26 MED ORDER — CALCIUM GLUCONATE-NACL 1-0.675 GM/50ML-% IV SOLN
1.0000 g | Freq: Once | INTRAVENOUS | Status: DC
  Filled 2022-04-26: qty 50

## 2022-04-26 NOTE — Care Management Important Message (Signed)
Important Message  Patient Details  Name: [name] MRN: [id number] Date of Birth: [birth year]   Medicare Important Message Given:  Yes     Corey Harold 04/26/2022, 2:46 PM

## 2022-04-30 DIAGNOSIS — N179 Acute kidney failure, unspecified: Secondary | ICD-10-CM | POA: Diagnosis not present

## 2022-04-30 DIAGNOSIS — I5030 Unspecified diastolic (congestive) heart failure: Secondary | ICD-10-CM | POA: Diagnosis not present

## 2022-04-30 DIAGNOSIS — N1832 Chronic kidney disease, stage 3b: Secondary | ICD-10-CM | POA: Diagnosis not present

## 2022-05-01 NOTE — Telephone Encounter (Signed)
Patient called to follow up on his request to get second opinion.  Please call regarding referral.  [phone]

## 2022-05-02 NOTE — Telephone Encounter (Signed)
Patient recently was in the hospital and he does need to get blood test to recheck kidney function. It would be best to come in for an office with me to address his orthopedic concern and recheck this test.

## 2022-05-02 NOTE — Telephone Encounter (Signed)
Please review and advise on message below

## 2022-05-02 NOTE — Telephone Encounter (Signed)
Patient called back, scheduled appt with Dr. Milinda Cave on 7/5 at 2pm per patient request

## 2022-05-02 NOTE — Telephone Encounter (Signed)
Spoke with pt regarding recommendations, he mentioned they did blood test at the rehab facility yesterday. We could call ans ask for the results. I did advise provider would still need to see him in office to address ortho concern, he voiced understanding. He will call back to schedule appt and will need transportation arranged for appt. He would prefer afternoons ( around 1:30 or 2 pm slot)  FYI when pt calls back to schedule appt regarding appt times.

## 2022-05-02 NOTE — Telephone Encounter (Signed)
Lmom for patient to cb to schedule appt with Dr. Milinda Cave

## 2022-05-08 ENCOUNTER — Encounter: Payer: Self-pay | Admitting: Family Medicine

## 2022-05-08 ENCOUNTER — Ambulatory Visit (INDEPENDENT_AMBULATORY_CARE_PROVIDER_SITE_OTHER): Payer: Medicare Other | Admitting: Family Medicine

## 2022-05-08 VITALS — BP 103/63 | HR 69 | Temp 97.8°F | Ht 70.0 in | Wt 320.0 lb

## 2022-05-08 DIAGNOSIS — G8929 Other chronic pain: Secondary | ICD-10-CM

## 2022-05-08 DIAGNOSIS — M25561 Pain in right knee: Secondary | ICD-10-CM

## 2022-05-08 DIAGNOSIS — M170 Bilateral primary osteoarthritis of knee: Secondary | ICD-10-CM | POA: Diagnosis not present

## 2022-05-08 DIAGNOSIS — I5032 Chronic diastolic (congestive) heart failure: Secondary | ICD-10-CM

## 2022-05-08 DIAGNOSIS — L89142 Pressure ulcer of left lower back, stage 2: Secondary | ICD-10-CM | POA: Diagnosis not present

## 2022-05-08 DIAGNOSIS — M25562 Pain in left knee: Secondary | ICD-10-CM | POA: Diagnosis not present

## 2022-05-08 DIAGNOSIS — N183 Chronic kidney disease, stage 3 unspecified: Secondary | ICD-10-CM | POA: Diagnosis not present

## 2022-05-08 DIAGNOSIS — N179 Acute kidney failure, unspecified: Secondary | ICD-10-CM

## 2022-05-08 NOTE — Progress Notes (Signed)
OFFICE VISIT  05/08/2022  CC:  Chief Complaint  Patient presents with   Discuss referral    Pt would like 2nd opinion    Patient is a 71 y.o. male who presents for follow-up heart failure, renal insufficiency, and bilateral knee pain.  INTERIM HX: Admitted to the hospital 04/23/2022 for a couple of days due to hyponatremia and acute kidney injury. Creatinine was down to 1.46 at d/c and he went home on torsemide 40mg  qd. D/C summary states his d/c wt was 333 lbs. He was 320 lbs today at [facility] rehab).  He reports feeling pretty well overall.  No recent shortness of breath or chest pain. Lower extremity swelling is stable.  He is upset about his most recent orthopedic evaluation (patient states it was at [facility] when orthopedics). He was told he is not a surgical candidate, was told "nothing further to offer".  No records available to me today. He did get a steroid injection in right knee when in the hospital couple months ago and says it did not help at all.  Fluid analysis at that time apparently did show intracellular monosodium urate crystals, WBC 4.5K.  Past Medical History:  Diagnosis Date   ALLERGIC RHINITIS 08/11/2006   ASTHMA 08/11/2006   Bradycardia    Beta blocker d/c'd 2022   Chronic atrial fibrillation ([place]) 08/2014   Chronic combined systolic and diastolic CHF (congestive heart failure) [facility])    Cardiology f/u 12/2017: pt volume overloaded (R heart dysf suspected), BNP very high, lasix increased.  Repeat echo 12/2017: normal LV EF, mild DD, +RV syst dysfxn, mod pulm HTN, biatrial enlgmt.   Chronic constipation    Chronic renal insufficiency, stage 3 (moderate) (HCC)    Colon cancer screening 02/2021   02/2021 Cologuard POS->GI ref   DIABETES MELLITUS, TYPE II 08/11/2006   HYPERTENSION 08/11/2006   IDA (iron deficiency anemia)    04/2022.  Iron started.  Needs hemoccults   Impaired mobility and endurance    MRSA infection 06/2018   LL venous stasis ulcer  infected   Normocytic anemia 2016-2019   03/2018 B12 normal, iron ok (ferritin borderline low).   OBESITY, MORBID 12/14/2007   saxenda started 05/2020 by [facility] wt mgmt center   OSA (obstructive sleep apnea)    to get sleep study with [facility]) as of 12/03/2018 consult.   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  [id number] ID started him on amoxil prophylaxis---ineffective.  End 2018/Jan 2019 penicillin G injections prophyl helpful but pt declined to continue this as of 01/2018 ID f/u.  ID talked him into resuming monthly penicillin G as of 02/2018 f/u.     Restless leg syndrome    Rx'd clonazepam 09/2017 and pt refused to take it after reading the medication's potential side effects.   Venous stasis ulcers of both lower extremities (HCC)    Severe lymphedema.  wound clinic care ongoing as of 01/2018    Past Surgical History:  Procedure Laterality Date   Carotid dopplers  07/23/2018   Left NORMAL.  Right 1-39% ICA stenosis, with <50% distal CCA stenosis (not hemodynamically significant)   LE venous dopplers Bilateral 03/06/2022   no dvt   RIGHT HEART CATH N/A 02/19/2022   Marked volume overload, R>>L HF, preserved CO. Procedure: RIGHT HEART CATH;  Surgeon: Jolaine Artist, MD;  Location: [place] [facility];  Service: Cardiovascular;  Laterality: N/A;   TRANSTHORACIC ECHOCARDIOGRAM  11/2007;  09/2014; 11/2015;12/2017   LV fxn normal, EF normal, mild dilation of left atrium.  2015 grade II diast dysfxn.  2017 EF 55-60%. 2019: LVEF 60-65%, mild RV syst dysf,biatrial enlgmt, mod pulm htn. 02/14/22 EF 55-60%, unable to assess diast fxn, LVH, o/w normal   URETERAL STENT PLACEMENT     virtual colonoscopy  01/2011   Normal    Outpatient Medications Prior to Visit  Medication Sig Dispense Refill   apixaban (ELIQUIS) 5 MG TABS tablet Take 5 mg by mouth 2 (two) times daily.     b complex vitamins capsule Take 1 capsule by  mouth every morning.     cetirizine (ZYRTEC) 10 MG tablet Take 10 mg by mouth at bedtime.     diclofenac Sodium (VOLTAREN) 1 % GEL Apply 2 g topically 4 (four) times daily.     diltiazem (TIAZAC) 120 MG 24 hr capsule Take 1 capsule (120 mg total) by mouth daily. 90 capsule 3   glucose blood (ONETOUCH ULTRA) test strip CHECK BLOOD SUGAR TWICE  DAILY 200 strip 0   HYDROcodone-acetaminophen (NORCO/VICODIN) 5-325 MG tablet Take 1-2 tablets by mouth every 6 (six) hours as needed for moderate pain. 1-2 tabs po bid prn pain (Patient taking differently: Take 1 tablet by mouth at bedtime.) 10 tablet 0   insulin aspart protamine- aspart (NOVOLOG MIX 70/30) (70-30) 100 UNIT/ML injection Inject 0.3 mLs (30 Units total) into the skin 2 (two) times daily with a meal. 10 mL 0   Insulin Pen Needle (B-D ULTRAFINE III SHORT PEN) 31G X 8 MM MISC USE 1 PENNEEDLE TWICE DAILY 200 each 1   Multiple Vitamin (MULTIVITAMIN) tablet Take 1 tablet by mouth daily.     nystatin (MYCOSTATIN/NYSTOP) powder Apply 1 application. topically 3 (three) times daily as needed.     potassium chloride SA (KLOR-CON M20) 20 MEQ tablet Take one tablet three times per week. 45 tablet 2   senna-docusate (SENOKOT-S) 8.6-50 MG tablet Take 1 tablet by mouth at bedtime.     terazosin (HYTRIN) 10 MG capsule Take 10 mg by mouth at bedtime.     Torsemide 40 MG TABS Take 40 mg by mouth daily.     traMADol (ULTRAM) 50 MG tablet Take 50 mg by mouth daily at 6 (six) AM.     vitamin E 400 UNIT capsule Take 400 Units by mouth every morning. Vitamin E with Selenium     calcium carbonate (OS-CAL - DOSED IN MG OF ELEMENTAL CALCIUM) 1250 (500 Ca) MG tablet Take 1 tablet (500 mg of elemental calcium total) by mouth 2 (two) times daily with a meal for 10 days. 20 tablet 0   No facility-administered medications prior to visit.    Allergies  Allergen Reactions   Other Other (See Comments)    Sneezing, coughing   Hydralazine Other (See Comments)     Drowsiness/sedation/mental fog    ROS As per HPI  PE:    05/08/2022    2:01 PM 04/26/2022    7:00 PM 04/26/2022   12:29 PM  Vitals with BMI  Height 5\' 10"     Weight 320 lbs    BMI 45.92    Systolic 103 142  Diastolic 63 91 84  Pulse 69 86 90  02 sat 95% RA today   Physical Exam  Gen: Alert, well appearing.  Patient is oriented to person, place, time, and situation. AFFECT: pleasant, lucid thought and speech. CV: irreg irreg, no murmur Chest is clear, no wheezing or rales.  Normal symmetric air entry throughout both lung fields. No chest wall deformities or tenderness. EXT: bilat UNNA boots  LABS:  Last CBC Lab Results  Component Value Date   WBC 10.3 04/23/2022   HGB 13.4 04/23/2022   HCT 41.7 04/23/2022   MCV 68.0 (L) 04/23/2022   MCH 21.9 (L) 04/23/2022   RDW 21.3 (H) 04/23/2022   PLT 303 04/23/2022   Lab Results  Component Value Date   IRON 22 (L) 04/11/2022   TIBC 299 04/11/2022   FERRITIN 71 04/11/2022   Last metabolic panel Lab Results  Component Value Date   GLUCOSE 326 (H) 04/26/2022   NA 131 (L) 04/26/2022   K 4.0 04/26/2022   CL 93 (L) 04/26/2022   CO2 30 04/26/2022   BUN 66 (H) 04/26/2022   CREATININE 1.46 (H) 04/26/2022   GFRNONAA 51 (L) 04/26/2022   CALCIUM 8.3 (L) 04/26/2022   PHOS 4.2 02/14/2022   PROT 7.1 02/28/2022   ALBUMIN 2.8 (L) 02/28/2022   BILITOT 0.7 02/28/2022   ALKPHOS 96 02/28/2022   AST 25 02/28/2022   ALT 26 02/28/2022   ANIONGAP 8 04/26/2022   Last thyroid functions Lab Results  Component Value Date   TSH 5.86 (H) 04/11/2022   T3TOTAL 82 04/11/2022     Lab Results  Component Value Date   HGBA1C 8.1 (H) 04/11/2022   IMPRESSION AND PLAN:  #1 chronic diastolic heart failure.  No sign of volume overload today. Weight is down 13 pounds compared to discharge from hospital 2 weeks ago. Continue torsemide 40 mg a day.  He is currently on 20 mill equivalents of potassium 3 times per week. Basic metabolic panel  today.  #2 chronic renal insufficiency stage III. Recent acute kidney injury due to diuresis for his heart failure. His creatinine was back to baseline on the day of discharge 2 weeks ago. Monitoring this closely. Electrolytes and creatinine today.  #3 bilateral osteoarthritis of knees. Joint fluid evaluation 2 months ago while in the hospital did show monosodium urate crystals as well. Poor candidate for colchicine due to renal insufficiency.  Not NSAID candidate due renal insufficiency and being on Eliquis. Corticosteroid injection not helpful for right knee when in the hospital 2 months ago. Recent orthopedics evaluation--patient not surgery candidate, patient requests second opinion to see if anything further to offer. I have referred him to Dr. Ayesha Mohair in sports medicine to see if he is possibly candidate for hyaluronic acid injections or PRP injections.  An After Visit Summary was printed and given to the patient.  FOLLOW UP: Return for 6-8 wks routine chronic illness f/u.  Signed:  Santiago Bumpers, MD           05/08/2022

## 2022-05-09 LAB — BASIC METABOLIC PANEL
BUN: 25 mg/dL — ABNORMAL HIGH (ref 6–23)
CO2: 29 mEq/L (ref 19–32)
Calcium: 8 mg/dL — ABNORMAL LOW (ref 8.4–10.5)
Chloride: 99 mEq/L (ref 96–112)
Creatinine, Ser: 1.37 mg/dL (ref 0.40–1.50)
GFR: 52.05 mL/min — ABNORMAL LOW (ref >60.00)
Glucose, Bld: 141 mg/dL — ABNORMAL HIGH (ref 70–99)
Potassium: 4.5 mEq/L (ref 3.5–5.1)
Sodium: 135 mEq/L (ref 135–145)

## 2022-05-10 ENCOUNTER — Telehealth: Payer: Self-pay

## 2022-05-10 DIAGNOSIS — N1832 Chronic kidney disease, stage 3b: Secondary | ICD-10-CM | POA: Diagnosis not present

## 2022-05-10 DIAGNOSIS — N390 Urinary tract infection, site not specified: Secondary | ICD-10-CM | POA: Diagnosis not present

## 2022-05-10 NOTE — Telephone Encounter (Signed)
Patient returning call regarding lab results.  Patient can be reached at [phone].  Please fax all lab results (per pt request) to Rehab center that is currently staying.  [facility] [phone]

## 2022-05-10 NOTE — Telephone Encounter (Signed)
LM for pt to check MyChart and that results have been faxed.

## 2022-05-14 NOTE — Progress Notes (Deleted)
    Aleen Sells D.[facility] [address] [place] [address] Phone: [phone]   Assessment and Plan:     There are no diagnoses linked to this encounter.  ***   Pertinent previous records reviewed include ***   Follow Up: ***     Subjective:   I, Mikel Hardgrove, am serving as a Neurosurgeon for Doctor Richardean Sale  Chief Complaint: ***  HPI:   05/15/2022 Patient is a 71 year old male complaining of   Relevant Historical Information: ***  Additional pertinent review of systems negative.   Current Outpatient Medications:    apixaban (ELIQUIS) 5 MG TABS tablet, Take 5 mg by mouth 2 (two) times daily., Disp: , Rfl:    b complex vitamins capsule, Take 1 capsule by mouth every morning., Disp: , Rfl:    calcium carbonate (OS-CAL - DOSED IN MG OF ELEMENTAL CALCIUM) 1250 (500 Ca) MG tablet, Take 1 tablet (500 mg of elemental calcium total) by mouth 2 (two) times daily with a meal for 10 days., Disp: 20 tablet, Rfl: 0   cetirizine (ZYRTEC) 10 MG tablet, Take 10 mg by mouth at bedtime., Disp: , Rfl:    diclofenac Sodium (VOLTAREN) 1 % GEL, Apply 2 g topically 4 (four) times daily., Disp: , Rfl:    diltiazem (TIAZAC) 120 MG 24 hr capsule, Take 1 capsule (120 mg total) by mouth daily., Disp: 90 capsule, Rfl: 3   glucose blood (ONETOUCH ULTRA) test strip, CHECK BLOOD SUGAR TWICE  DAILY, Disp: 200 strip, Rfl: 0   HYDROcodone-acetaminophen (NORCO/VICODIN) 5-325 MG tablet, Take 1-2 tablets by mouth every 6 (six) hours as needed for moderate pain. 1-2 tabs po bid prn pain (Patient taking differently: Take 1 tablet by mouth at bedtime.), Disp: 10 tablet, Rfl: 0   insulin aspart protamine- aspart (NOVOLOG MIX 70/30) (70-30) 100 UNIT/ML injection, Inject 0.3 mLs (30 Units total) into the skin 2 (two) times daily with a meal., Disp: 10 mL, Rfl: 0   Insulin Pen Needle (B-D ULTRAFINE III SHORT PEN) 31G X 8 MM MISC, USE 1 PENNEEDLE TWICE DAILY, Disp: 200  each, Rfl: 1   Multiple Vitamin (MULTIVITAMIN) tablet, Take 1 tablet by mouth daily., Disp: , Rfl:    nystatin (MYCOSTATIN/NYSTOP) powder, Apply 1 application. topically 3 (three) times daily as needed., Disp: , Rfl:    potassium chloride SA (KLOR-CON M20) 20 MEQ tablet, Take one tablet three times per week., Disp: 45 tablet, Rfl: 2   senna-docusate (SENOKOT-S) 8.6-50 MG tablet, Take 1 tablet by mouth at bedtime., Disp: , Rfl:    terazosin (HYTRIN) 10 MG capsule, Take 10 mg by mouth at bedtime., Disp: , Rfl:    Torsemide 40 MG TABS, Take 40 mg by mouth daily., Disp: , Rfl:    traMADol (ULTRAM) 50 MG tablet, Take 50 mg by mouth daily at 6 (six) AM., Disp: , Rfl:    vitamin E 400 UNIT capsule, Take 400 Units by mouth every morning. Vitamin E with Selenium, Disp: , Rfl:    Objective:     There were no vitals filed for this visit.    There is no height or weight on file to calculate BMI.    Physical Exam:    ***   Electronically signed by:  Aleen Sells D[facility] 8:11 AM 05/14/22

## 2022-05-15 ENCOUNTER — Ambulatory Visit: Payer: Medicare Other | Admitting: Sports Medicine

## 2022-05-15 DIAGNOSIS — L89142 Pressure ulcer of left lower back, stage 2: Secondary | ICD-10-CM | POA: Diagnosis not present

## 2022-05-20 NOTE — Progress Notes (Deleted)
Aleen Sells D.[facility] [address] [place] [address] Phone: [phone]   Assessment and Plan:     There are no diagnoses linked to this encounter.  ***   Pertinent previous records reviewed include ***   Follow Up: ***     Subjective:   I, Laguana Desautel, am serving as a Neurosurgeon for Doctor Richardean Sale  Chief Complaint: bilateral knee pain  HPI:   05/21/2022 Patient is a 71 year old male complaining of bilateral knee pain. Patient states He is upset about his most recent orthopedic evaluation (patient states it was at [facility] when orthopedics).He was told he is not a surgical candidate, was told "nothing further to offer".  No records available to me today.He did get a steroid injection in right knee when in the hospital couple months ago and says it did not help at all.  Fluid analysis at that time apparently did show intracellular monosodium urate crystals, WBC 4.5K.   Relevant Historical Information: ***  Additional pertinent review of systems negative.   Current Outpatient Medications:    apixaban (ELIQUIS) 5 MG TABS tablet, Take 5 mg by mouth 2 (two) times daily., Disp: , Rfl:    b complex vitamins capsule, Take 1 capsule by mouth every morning., Disp: , Rfl:    calcium carbonate (OS-CAL - DOSED IN MG OF ELEMENTAL CALCIUM) 1250 (500 Ca) MG tablet, Take 1 tablet (500 mg of elemental calcium total) by mouth 2 (two) times daily with a meal for 10 days., Disp: 20 tablet, Rfl: 0   cetirizine (ZYRTEC) 10 MG tablet, Take 10 mg by mouth at bedtime., Disp: , Rfl:    diclofenac Sodium (VOLTAREN) 1 % GEL, Apply 2 g topically 4 (four) times daily., Disp: , Rfl:    diltiazem (TIAZAC) 120 MG 24 hr capsule, Take 1 capsule (120 mg total) by mouth daily., Disp: 90 capsule, Rfl: 3   glucose blood (ONETOUCH ULTRA) test strip, CHECK BLOOD SUGAR TWICE  DAILY, Disp: 200 strip, Rfl: 0   HYDROcodone-acetaminophen (NORCO/VICODIN) 5-325 MG  tablet, Take 1-2 tablets by mouth every 6 (six) hours as needed for moderate pain. 1-2 tabs po bid prn pain (Patient taking differently: Take 1 tablet by mouth at bedtime.), Disp: 10 tablet, Rfl: 0   insulin aspart protamine- aspart (NOVOLOG MIX 70/30) (70-30) 100 UNIT/ML injection, Inject 0.3 mLs (30 Units total) into the skin 2 (two) times daily with a meal., Disp: 10 mL, Rfl: 0   Insulin Pen Needle (B-D ULTRAFINE III SHORT PEN) 31G X 8 MM MISC, USE 1 PENNEEDLE TWICE DAILY, Disp: 200 each, Rfl: 1   Multiple Vitamin (MULTIVITAMIN) tablet, Take 1 tablet by mouth daily., Disp: , Rfl:    nystatin (MYCOSTATIN/NYSTOP) powder, Apply 1 application. topically 3 (three) times daily as needed., Disp: , Rfl:    potassium chloride SA (KLOR-CON M20) 20 MEQ tablet, Take one tablet three times per week., Disp: 45 tablet, Rfl: 2   senna-docusate (SENOKOT-S) 8.6-50 MG tablet, Take 1 tablet by mouth at bedtime., Disp: , Rfl:    terazosin (HYTRIN) 10 MG capsule, Take 10 mg by mouth at bedtime., Disp: , Rfl:    Torsemide 40 MG TABS, Take 40 mg by mouth daily., Disp: , Rfl:    traMADol (ULTRAM) 50 MG tablet, Take 50 mg by mouth daily at 6 (six) AM., Disp: , Rfl:    vitamin E 400 UNIT capsule, Take 400 Units by mouth every morning. Vitamin E with Selenium,  Disp: , Rfl:    Objective:     There were no vitals filed for this visit.    There is no height or weight on file to calculate BMI.    Physical Exam:    ***   Electronically signed by:  Aleen Sells [facility] 1:43 PM 05/20/22

## 2022-05-21 ENCOUNTER — Ambulatory Visit: Payer: Medicare Other | Admitting: Sports Medicine

## 2022-05-22 ENCOUNTER — Ambulatory Visit: Payer: Medicare Other | Admitting: Sports Medicine

## 2022-05-22 DIAGNOSIS — L97519 Non-pressure chronic ulcer of other part of right foot with unspecified severity: Secondary | ICD-10-CM | POA: Diagnosis not present

## 2022-05-22 NOTE — Progress Notes (Deleted)
Aleen Sells D.[facility] [address] [place] [address] Phone: [phone]   Assessment and Plan:     There are no diagnoses linked to this encounter.  ***   Pertinent previous records reviewed include ***   Follow Up: ***     Subjective:   I, [name], am serving as a Neurosurgeon for Doctor Richardean Sale  Chief Complaint: knee pain   HPI:   05/22/22 Patient is a 71 year old male complaining of knee pain. Patient states He is upset about his most recent orthopedic evaluation (patient states it was at [facility] when orthopedics).He was told he is not a surgical candidate, was told "nothing further to offer".  No records available to me today.He did get a steroid injection in right knee when in the hospital couple months ago and says it did not help at all.  Fluid analysis at that time apparently did show intracellular monosodium urate crystals, WBC 4.5K.  Relevant Historical Information: ***  Additional pertinent review of systems negative.   Current Outpatient Medications:    apixaban (ELIQUIS) 5 MG TABS tablet, Take 5 mg by mouth 2 (two) times daily., Disp: , Rfl:    b complex vitamins capsule, Take 1 capsule by mouth every morning., Disp: , Rfl:    calcium carbonate (OS-CAL - DOSED IN MG OF ELEMENTAL CALCIUM) 1250 (500 Ca) MG tablet, Take 1 tablet (500 mg of elemental calcium total) by mouth 2 (two) times daily with a meal for 10 days., Disp: 20 tablet, Rfl: 0   cetirizine (ZYRTEC) 10 MG tablet, Take 10 mg by mouth at bedtime., Disp: , Rfl:    diclofenac Sodium (VOLTAREN) 1 % GEL, Apply 2 g topically 4 (four) times daily., Disp: , Rfl:    diltiazem (TIAZAC) 120 MG 24 hr capsule, Take 1 capsule (120 mg total) by mouth daily., Disp: 90 capsule, Rfl: 3   glucose blood (ONETOUCH ULTRA) test strip, CHECK BLOOD SUGAR TWICE  DAILY, Disp: 200 strip, Rfl: 0   HYDROcodone-acetaminophen (NORCO/VICODIN) 5-325 MG tablet, Take 1-2 tablets by  mouth every 6 (six) hours as needed for moderate pain. 1-2 tabs po bid prn pain (Patient taking differently: Take 1 tablet by mouth at bedtime.), Disp: 10 tablet, Rfl: 0   insulin aspart protamine- aspart (NOVOLOG MIX 70/30) (70-30) 100 UNIT/ML injection, Inject 0.3 mLs (30 Units total) into the skin 2 (two) times daily with a meal., Disp: 10 mL, Rfl: 0   Insulin Pen Needle (B-D ULTRAFINE III SHORT PEN) 31G X 8 MM MISC, USE 1 PENNEEDLE TWICE DAILY, Disp: 200 each, Rfl: 1   Multiple Vitamin (MULTIVITAMIN) tablet, Take 1 tablet by mouth daily., Disp: , Rfl:    nystatin (MYCOSTATIN/NYSTOP) powder, Apply 1 application. topically 3 (three) times daily as needed., Disp: , Rfl:    potassium chloride SA (KLOR-CON M20) 20 MEQ tablet, Take one tablet three times per week., Disp: 45 tablet, Rfl: 2   senna-docusate (SENOKOT-S) 8.6-50 MG tablet, Take 1 tablet by mouth at bedtime., Disp: , Rfl:    terazosin (HYTRIN) 10 MG capsule, Take 10 mg by mouth at bedtime., Disp: , Rfl:    Torsemide 40 MG TABS, Take 40 mg by mouth daily., Disp: , Rfl:    traMADol (ULTRAM) 50 MG tablet, Take 50 mg by mouth daily at 6 (six) AM., Disp: , Rfl:    vitamin E 400 UNIT capsule, Take 400 Units by mouth every morning. Vitamin E with Selenium, Disp: ,  Rfl:    Objective:     There were no vitals filed for this visit.    There is no height or weight on file to calculate BMI.    Physical Exam:    ***   Electronically signed by:  Aleen Sells [facility] 7:44 AM 05/22/22

## 2022-05-24 NOTE — Progress Notes (Unsigned)
[name] D.[facility] [address] [place] [address] Phone: [phone]   Assessment and Plan:     There are no diagnoses linked to this encounter.  ***   Pertinent previous records reviewed include ***   Follow Up: ***     Subjective:   I, [name], am serving as a Neurosurgeon for Doctor Richardean Sale  Chief Complaint: 2nd opinion   HPI:   05/27/2022 Patient is a 71 year old male complaining of knee pain. Patient states He is upset about his most recent orthopedic evaluation (patient states it was at [facility] when orthopedics). He was told he is not a surgical candidate, was told "nothing further to offer".  No records available to me today. He did get a steroid injection in right knee when in the hospital couple months ago and says it did not help at all.  Fluid analysis at that time apparently did show intracellular monosodium urate crystals, WBC 4.5K.  Relevant Historical Information: ***  Additional pertinent review of systems negative.   Current Outpatient Medications:    apixaban (ELIQUIS) 5 MG TABS tablet, Take 5 mg by mouth 2 (two) times daily., Disp: , Rfl:    b complex vitamins capsule, Take 1 capsule by mouth every morning., Disp: , Rfl:    calcium carbonate (OS-CAL - DOSED IN MG OF ELEMENTAL CALCIUM) 1250 (500 Ca) MG tablet, Take 1 tablet (500 mg of elemental calcium total) by mouth 2 (two) times daily with a meal for 10 days., Disp: 20 tablet, Rfl: 0   cetirizine (ZYRTEC) 10 MG tablet, Take 10 mg by mouth at bedtime., Disp: , Rfl:    diclofenac Sodium (VOLTAREN) 1 % GEL, Apply 2 g topically 4 (four) times daily., Disp: , Rfl:    diltiazem (TIAZAC) 120 MG 24 hr capsule, Take 1 capsule (120 mg total) by mouth daily., Disp: 90 capsule, Rfl: 3   glucose blood (ONETOUCH ULTRA) test strip, CHECK BLOOD SUGAR TWICE  DAILY, Disp: 200 strip, Rfl: 0   HYDROcodone-acetaminophen (NORCO/VICODIN) 5-325 MG tablet, Take 1-2  tablets by mouth every 6 (six) hours as needed for moderate pain. 1-2 tabs po bid prn pain (Patient taking differently: Take 1 tablet by mouth at bedtime.), Disp: 10 tablet, Rfl: 0   insulin aspart protamine- aspart (NOVOLOG MIX 70/30) (70-30) 100 UNIT/ML injection, Inject 0.3 mLs (30 Units total) into the skin 2 (two) times daily with a meal., Disp: 10 mL, Rfl: 0   Insulin Pen Needle (B-D ULTRAFINE III SHORT PEN) 31G X 8 MM MISC, USE 1 PENNEEDLE TWICE DAILY, Disp: 200 each, Rfl: 1   Multiple Vitamin (MULTIVITAMIN) tablet, Take 1 tablet by mouth daily., Disp: , Rfl:    nystatin (MYCOSTATIN/NYSTOP) powder, Apply 1 application. topically 3 (three) times daily as needed., Disp: , Rfl:    potassium chloride SA (KLOR-CON M20) 20 MEQ tablet, Take one tablet three times per week., Disp: 45 tablet, Rfl: 2   senna-docusate (SENOKOT-S) 8.6-50 MG tablet, Take 1 tablet by mouth at bedtime., Disp: , Rfl:    terazosin (HYTRIN) 10 MG capsule, Take 10 mg by mouth at bedtime., Disp: , Rfl:    Torsemide 40 MG TABS, Take 40 mg by mouth daily., Disp: , Rfl:    traMADol (ULTRAM) 50 MG tablet, Take 50 mg by mouth daily at 6 (six) AM., Disp: , Rfl:    vitamin E 400 UNIT capsule, Take 400 Units by mouth every morning. Vitamin E with Selenium,  Disp: , Rfl:    Objective:     There were no vitals filed for this visit.    There is no height or weight on file to calculate BMI.    Physical Exam:    ***   Electronically signed by:  [name] [facility] 8:34 AM 05/24/22

## 2022-05-27 ENCOUNTER — Ambulatory Visit (INDEPENDENT_AMBULATORY_CARE_PROVIDER_SITE_OTHER): Payer: Medicare Other | Admitting: Sports Medicine

## 2022-05-27 VITALS — Ht 70.0 in | Wt 320.0 lb

## 2022-05-27 DIAGNOSIS — M25561 Pain in right knee: Secondary | ICD-10-CM | POA: Diagnosis not present

## 2022-05-27 DIAGNOSIS — G8929 Other chronic pain: Secondary | ICD-10-CM | POA: Diagnosis not present

## 2022-05-27 DIAGNOSIS — M1711 Unilateral primary osteoarthritis, right knee: Secondary | ICD-10-CM | POA: Diagnosis not present

## 2022-05-27 NOTE — Patient Instructions (Addendum)
Good to see you Ablation referral  geniculate nerve ablation As needed follow up

## 2022-05-29 DIAGNOSIS — L97322 Non-pressure chronic ulcer of left ankle with fat layer exposed: Secondary | ICD-10-CM | POA: Diagnosis not present

## 2022-05-29 DIAGNOSIS — L97529 Non-pressure chronic ulcer of other part of left foot with unspecified severity: Secondary | ICD-10-CM | POA: Diagnosis not present

## 2022-05-30 ENCOUNTER — Ambulatory Visit: Payer: Medicare Other | Admitting: Psychologist

## 2022-05-30 DIAGNOSIS — N390 Urinary tract infection, site not specified: Secondary | ICD-10-CM | POA: Diagnosis not present

## 2022-06-04 ENCOUNTER — Ambulatory Visit (INDEPENDENT_AMBULATORY_CARE_PROVIDER_SITE_OTHER): Payer: Medicare Other | Admitting: Orthopaedic Surgery

## 2022-06-04 ENCOUNTER — Encounter: Payer: Self-pay | Admitting: Orthopaedic Surgery

## 2022-06-04 VITALS — Ht 70.0 in | Wt 343.0 lb

## 2022-06-04 DIAGNOSIS — Z6841 Body Mass Index (BMI) 40.0 and over, adult: Secondary | ICD-10-CM | POA: Insufficient documentation

## 2022-06-04 DIAGNOSIS — M1711 Unilateral primary osteoarthritis, right knee: Secondary | ICD-10-CM | POA: Diagnosis not present

## 2022-06-04 DIAGNOSIS — M1712 Unilateral primary osteoarthritis, left knee: Secondary | ICD-10-CM | POA: Diagnosis not present

## 2022-06-04 NOTE — Progress Notes (Signed)
Office Visit Note   Patient: [name]           Date of Birth: [birth year]           MRN: [id number] Visit Date: 06/04/2022              Requested by: Glennon Mac, [place],   [id number] PCP: Tammi Sou, MD   Assessment & Plan: Visit Diagnoses:  1. Primary osteoarthritis of right knee   2. Primary osteoarthritis of left knee   3. Body mass index 45.0-49.9, adult (HCC)     Plan: Impression is advanced bilateral knee DJD.  Patient is morbidly obese and unable to stand or weight-bear to any significant degree.  He is not a surgical candidate due to increased BMI and medical comorbidities and deconditioning.  1 last resort to try is nerve ablation of the knee which we will make referral to Dr. Ernestina Patches for.  Otherwise we will see him back as needed.  The patient meets the AMA guidelines for Morbid (severe) obesity with a BMI > 40.0 and I have recommended weight loss.  Follow-Up Instructions: No follow-ups on file.   Orders:  No orders of the defined types were placed in this encounter.  No orders of the defined types were placed in this encounter.     Procedures: No procedures performed   Clinical Data: No additional findings.   Subjective: Chief Complaint  Patient presents with   Right Knee - Pain   Left Knee - Pain    HPI [name] is a 71 year old gentleman currently living in a nursing facility comes in for evaluation of severe bilateral knee pain.  Has been unable to ambulate.  Has severe end-stage bilateral knee DJD.  Ambulates in a wheelchair mainly due to the pain. Review of Systems  Constitutional: Negative.   All other systems reviewed and are negative.    Objective: Vital Signs: Ht 5\' 10"  (1.778 m)   Wt (!) 343 lb (155.6 kg)   BMI 49.22 kg/m   Physical Exam Vitals and nursing note reviewed.  Constitutional:      Appearance: He is well-developed.  HENT:     Head: Normocephalic and atraumatic.  Eyes:      Pupils: Pupils are equal, round, and reactive to light.  Pulmonary:     Effort: Pulmonary effort is normal.  Abdominal:     Palpations: Abdomen is soft.  Musculoskeletal:        General: Normal range of motion.     Cervical back: Neck supple.  Skin:    General: Skin is warm.  Neurological:     Mental Status: He is alert and oriented to person, place, and time.  Psychiatric:        Behavior: Behavior normal.        Thought Content: Thought content normal.        Judgment: Judgment normal.     Ortho Exam Examination of bilateral knees shows a massive pitting edema and venous stasis wounds that are wrapped up on both lower legs.  Crepitus throughout range of motion. Specialty Comments:  No specialty comments available.  Imaging: No results found.   PMFS History: Patient Active Problem List   Diagnosis Date Noted   Primary osteoarthritis of right knee 06/04/2022   Primary osteoarthritis of left knee 06/04/2022   Body mass index 45.0-49.9, adult ([place]) 06/04/2022   AKI (acute kidney injury) ([place]) 04/25/2022   Acute renal failure superimposed on stage  3b chronic kidney disease (HCC) 04/23/2022   Hyponatremia 04/23/2022   Pressure injury of skin 03/07/2022   Acute kidney injury superimposed on chronic kidney disease (HCC)    Obesity, Class III, BMI 40-49.9 (morbid obesity) (HCC)    Acute on chronic diastolic CHF (congestive heart failure) (HCC) 02/13/2022   Uncontrolled type 2 diabetes mellitus with hyperglycemia (HCC) 03/04/2018   Chronic diastolic CHF (congestive heart failure) (HCC) 12/24/2016   Lymphedema 02/22/2016   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   Chronic headaches 05/10/2015   Equivocal stress test 10/24/2014   Persistent atrial fibrillation (HCC) 08/15/2014   Healthcare maintenance 08/15/2014   Type 2 diabetes mellitus with diabetic neuropathy (HCC) 08/15/2014   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   Recurrent cellulitis of lower extremity  08/02/2013   Lymphedema of both lower extremities 11/04/2008   Morbid obesity due to excess calories (HCC) 12/14/2007   Venous (peripheral) insufficiency 07/22/2007   Uncontrolled type 2 diabetes mellitus with hyperglycemia, with long-term current use of insulin (HCC) 08/11/2006   Hypertension, essential 08/11/2006   ALLERGIC RHINITIS 08/11/2006   ASTHMA 08/11/2006   Asthma 08/11/2006   Past Medical History:  Diagnosis Date   ALLERGIC RHINITIS 08/11/2006   ASTHMA 08/11/2006   Bradycardia    Beta blocker d/c'd 2022   Chronic atrial fibrillation (HCC) 08/2014   Chronic combined systolic and diastolic CHF (congestive heart failure) [facility])    Cardiology f/u 12/2017: pt volume overloaded (R heart dysf suspected), BNP very high, lasix increased.  Repeat echo 12/2017: normal LV EF, mild DD, +RV syst dysfxn, mod pulm HTN, biatrial enlgmt.   Chronic constipation    Chronic renal insufficiency, stage 3 (moderate) (HCC)    Colon cancer screening 02/2021   02/2021 Cologuard POS->GI ref   DIABETES MELLITUS, TYPE II 08/11/2006   HYPERTENSION 08/11/2006   IDA (iron deficiency anemia)    04/2022.  Iron started.  Needs hemoccults   Impaired mobility and endurance    MRSA infection 06/2018   LL venous stasis ulcer infected   Normocytic anemia 2016-2019   03/2018 B12 normal, iron ok (ferritin borderline low).   OBESITY, MORBID 12/14/2007   saxenda started 05/2020 by [facility] wt mgmt center   OSA (obstructive sleep apnea)    to get sleep study with [facility]) as of 12/03/2018 consult.   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  92018 ID started him on amoxil prophylaxis---ineffective.  End 2018/Jan 2019 penicillin G injections prophyl helpful but pt declined to continue this as of 01/2018 ID f/u.  ID talked him into resuming monthly penicillin G as of 02/2018 f/u.     Restless leg syndrome    Rx'd clonazepam 09/2017 and pt  refused to take it after reading the medication's potential side effects.   Venous stasis ulcers of both lower extremities (HCC)    Severe lymphedema.  wound clinic care ongoing as of 01/2018    Family History  Problem Relation Age of Onset   Diabetes Mother    Diabetes Sister    Hydrocephalus Sister        NPH    Past Surgical History:  Procedure Laterality Date   Carotid dopplers  07/23/2018   Left NORMAL.  Right 1-39% ICA stenosis, with <50% distal CCA stenosis (not hemodynamically significant)   LE venous dopplers Bilateral 03/06/2022   no dvt   RIGHT HEART CATH N/A 02/19/2022   Marked volume overload, R>>L HF, preserved CO. Procedure:  RIGHT HEART CATH;  Surgeon: Dolores Patty, MD;  Location: [facility] INVASIVE CV LAB;  Service: Cardiovascular;  Laterality: N/A;   TRANSTHORACIC ECHOCARDIOGRAM  11/2007; 09/2014; 11/2015;12/2017   LV fxn normal, EF normal, mild dilation of left atrium.  2015 grade II diast dysfxn.  2017 EF 55-60%. 2019: LVEF 60-65%, mild RV syst dysf,biatrial enlgmt, mod pulm htn. 02/14/22 EF 55-60%, unable to assess diast fxn, LVH, o/w normal   URETERAL STENT PLACEMENT     virtual colonoscopy  01/2011   Normal   Social History   Occupational History   Occupation: retired  Tobacco Use   Smoking status: Never    Passive exposure: Never   Smokeless tobacco: Never  Vaping Use   Vaping Use: Never used  Substance and Sexual Activity   Alcohol use: No   Drug use: No   Sexual activity: Not Currently

## 2022-06-04 NOTE — Addendum Note (Signed)
Addended by: Wendi Maya on: 06/04/2022 04:07 PM   Modules accepted: Orders

## 2022-06-05 DIAGNOSIS — L97329 Non-pressure chronic ulcer of left ankle with unspecified severity: Secondary | ICD-10-CM | POA: Diagnosis not present

## 2022-06-05 DIAGNOSIS — Z5181 Encounter for therapeutic drug level monitoring: Secondary | ICD-10-CM | POA: Diagnosis not present

## 2022-06-05 DIAGNOSIS — L97512 Non-pressure chronic ulcer of other part of right foot with fat layer exposed: Secondary | ICD-10-CM | POA: Diagnosis not present

## 2022-06-06 ENCOUNTER — Telehealth: Payer: Self-pay | Admitting: Physical Medicine and Rehabilitation

## 2022-06-06 ENCOUNTER — Ambulatory Visit: Payer: Medicare Other | Admitting: Podiatry

## 2022-06-06 DIAGNOSIS — M79675 Pain in left toe(s): Secondary | ICD-10-CM | POA: Diagnosis not present

## 2022-06-06 DIAGNOSIS — M79674 Pain in right toe(s): Secondary | ICD-10-CM

## 2022-06-06 DIAGNOSIS — Z794 Long term (current) use of insulin: Secondary | ICD-10-CM | POA: Diagnosis not present

## 2022-06-06 DIAGNOSIS — B351 Tinea unguium: Secondary | ICD-10-CM

## 2022-06-06 DIAGNOSIS — E1165 Type 2 diabetes mellitus with hyperglycemia: Secondary | ICD-10-CM

## 2022-06-06 NOTE — Telephone Encounter (Signed)
Spoke with patient he advised he is going to be sent out of the [facility] in 2 weeks and he can't walk. Patient said he believe the ablation will help him stand so that he can leave the facility. Patient asked if he can get a call back as soon as possible. Patient said he do not have anyone living with him that can help him once he is out of the facility.  [place] The number to contact patient is [phone]

## 2022-06-06 NOTE — Progress Notes (Signed)
  Subjective:  Patient ID: [name], male    DOB: 1951/01/15,  MRN: [id number]  Chief Complaint  Patient presents with   Nail Problem   71 y.o. male returns for the above complaint.  Patient presents with thickened elongated dystrophic toenails x10 mild pain on palpation patient not able to debride out himself he would like for me to do it.  He is diabetic with last A1c of 8.1%.  Objective:  There were no vitals filed for this visit. Podiatric Exam: Vascular: dorsalis pedis and posterior tibial pulses are palpable bilateral. Capillary return is immediate. Temperature gradient is WNL. Skin turgor WNL  Sensorium: Normal Semmes Weinstein monofilament test. Normal tactile sensation bilaterally. Nail Exam: Pt has thick disfigured discolored nails with subungual debris noted bilateral entire nail hallux through fifth toenails.  Pain on palpation to the nails. Ulcer Exam: There is no evidence of ulcer or pre-ulcerative changes or infection. Orthopedic Exam: Muscle tone and strength are WNL. No limitations in general ROM. No crepitus or effusions noted.  Skin: No Porokeratosis. No infection or ulcers    Assessment & Plan:   1. Pain due to onychomycosis of toenails of both feet   2. Uncontrolled type 2 diabetes mellitus with hyperglycemia, with long-term current use of insulin (HCC)     Patient was evaluated and treated and all questions answered.  Onychomycosis with pain  -Nails palliatively debrided as below. -Educated on self-care  Procedure: Nail Debridement Rationale: pain  Type of Debridement: manual, sharp debridement. Instrumentation: Nail nipper, rotary burr. Number of Nails: 10  Procedures and Treatment: Consent by patient was obtained for treatment procedures. The patient understood the discussion of treatment and procedures well. All questions were answered thoroughly reviewed. Debridement of mycotic and hypertrophic toenails, 1 through 5 bilateral and clearing of  subungual debris. No ulceration, no infection noted.  Return Visit-Office Procedure: Patient instructed to return to the office for a follow up visit 3 months for continued evaluation and treatment.  Nicholes Rough, DPM    No follow-ups on file.

## 2022-06-07 ENCOUNTER — Encounter: Payer: Medicare Other | Admitting: Physical Medicine and Rehabilitation

## 2022-06-07 ENCOUNTER — Telehealth: Payer: Self-pay | Admitting: Physical Medicine and Rehabilitation

## 2022-06-07 NOTE — Telephone Encounter (Signed)
Pt called to set an appt. Please call pt at [phone]

## 2022-06-10 ENCOUNTER — Ambulatory Visit (INDEPENDENT_AMBULATORY_CARE_PROVIDER_SITE_OTHER): Payer: Medicare Other | Admitting: Family Medicine

## 2022-06-10 ENCOUNTER — Encounter: Payer: Self-pay | Admitting: Family Medicine

## 2022-06-10 VITALS — BP 137/66 | HR 60 | Temp 98.1°F

## 2022-06-10 DIAGNOSIS — L03119 Cellulitis of unspecified part of limb: Secondary | ICD-10-CM | POA: Diagnosis not present

## 2022-06-10 DIAGNOSIS — M170 Bilateral primary osteoarthritis of knee: Secondary | ICD-10-CM | POA: Diagnosis not present

## 2022-06-10 DIAGNOSIS — N1831 Chronic kidney disease, stage 3a: Secondary | ICD-10-CM

## 2022-06-10 DIAGNOSIS — I5032 Chronic diastolic (congestive) heart failure: Secondary | ICD-10-CM

## 2022-06-10 DIAGNOSIS — N390 Urinary tract infection, site not specified: Secondary | ICD-10-CM

## 2022-06-10 NOTE — Progress Notes (Signed)
OFFICE VISIT  06/10/2022  CC:  Chief Complaint  Patient presents with   Chronic Kidney Disease   Patient is a 71 y.o. male who presents for 1 mo f/u chronic diastolic HF, chronic a-fib, and CRI III. A/P as of last visit: "#1 chronic diastolic heart failure.  No sign of volume overload today. Weight is down 13 pounds compared to discharge from hospital 2 weeks ago. Continue torsemide 40 mg a day.  He is currently on 20 mill equivalents of potassium 3 times per week. Basic metabolic panel today.   #2 chronic renal insufficiency stage III. Recent acute kidney injury due to diuresis for his heart failure. His creatinine was back to baseline on the day of discharge 2 weeks ago. Monitoring this closely. Electrolytes and creatinine today.   #3 bilateral osteoarthritis of knees. Joint fluid evaluation 2 months ago while in the hospital did show monosodium urate crystals as well. Poor candidate for colchicine due to renal insufficiency.  Not NSAID candidate due renal insufficiency and being on Eliquis. Corticosteroid injection not helpful for right knee when in the hospital 2 months ago. Recent orthopedics evaluation--patient not surgery candidate, patient requests second opinion to see if anything further to offer. I have referred him to Dr. Gardenia Phlegm in sports medicine to see if he is possibly candidate for hyaluronic acid injections or PRP injections."  INTERIM HX: Luckie is still living at a care facility Markian states his lower extremities have started to have a bad odor in the last few days and he says that usually signals infection for him.  He has bilateral Unna boots and has had these for years for his lymphedema.  He does have a history of recurrent/chronic Cellulitis of his legs.  Was on penicillin prophylaxis monthly up until 1 year ago.  He also says that he was treated for UTI relatively recently at his care facility, says Bactrim. He says after finishing the antibiotics a few  days went by and his symptoms (dysuria, urinary urgency and frequency) have returned for the last few days.  He says he does feel like he is retaining a little more fluid over the last month.  Says his weight 4 days ago at the facility was 330 pounds.  ROS as above, plus--> no fevers, no CP, no SOB, no wheezing, no cough, no dizziness, no HAs, no rashes, no melena/hematochezia.  No polyuria or polydipsia.  No myalgias.  No focal weakness, paresthesias, or tremors.  No acute vision or hearing abnormalities.   No recent changes in lower legs. No n/v/d or abd pain.  No palpitations.    PMP AWARE reviewed today: most recent rx for vicodin was filled 12/11/21, # 60, rx by me. No red flags.  Past Medical History:  Diagnosis Date   ALLERGIC RHINITIS 08/11/2006   ASTHMA 08/11/2006   Bradycardia    Beta blocker d/c'd 2022   Chronic atrial fibrillation ([place]) 08/2014   Chronic combined systolic and diastolic CHF (congestive heart failure) [facility])    Cardiology f/u 12/2017: pt volume overloaded (R heart dysf suspected), BNP very high, lasix increased.  Repeat echo 12/2017: normal LV EF, mild DD, +RV syst dysfxn, mod pulm HTN, biatrial enlgmt.   Chronic constipation    Chronic renal insufficiency, stage 3 (moderate) (HCC)    Colon cancer screening 02/2021   02/2021 Cologuard POS->GI ref   DIABETES MELLITUS, TYPE II 08/11/2006   HYPERTENSION 08/11/2006   IDA (iron deficiency anemia)    04/2022.  Iron started.  Needs hemoccults   Impaired mobility and endurance    MRSA infection 06/2018   LL venous stasis ulcer infected   Normocytic anemia 2016-2019   03/2018 B12 normal, iron ok (ferritin borderline low).   OBESITY, MORBID 12/14/2007   saxenda started 05/2020 by [facility] wt mgmt center   OSA (obstructive sleep apnea)    to get sleep study with [facility]) as of 12/03/2018 consult.   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   92018 ID started him on amoxil prophylaxis---ineffective.  End 2018/Jan 2019 penicillin G injections prophyl helpful but pt declined to continue this as of 01/2018 ID f/u.  ID talked him into resuming monthly penicillin G as of 02/2018 f/u.     Restless leg syndrome    Rx'd clonazepam 09/2017 and pt refused to take it after reading the medication's potential side effects.   Venous stasis ulcers of both lower extremities (HCC)    Severe lymphedema.  wound clinic care ongoing as of 01/2018    Past Surgical History:  Procedure Laterality Date   Carotid dopplers  07/23/2018   Left NORMAL.  Right 1-39% ICA stenosis, with <50% distal CCA stenosis (not hemodynamically significant)   LE venous dopplers Bilateral 03/06/2022   no dvt   RIGHT HEART CATH N/A 02/19/2022   Marked volume overload, R>>L HF, preserved CO. Procedure: RIGHT HEART CATH;  Surgeon: Jolaine Artist, MD;  Location: [place] CV [facility];  Service: Cardiovascular;  Laterality: N/A;   TRANSTHORACIC ECHOCARDIOGRAM  11/2007; 09/2014; 11/2015;12/2017   LV fxn normal, EF normal, mild dilation of left atrium.  2015 grade II diast dysfxn.  2017 EF 55-60%. 2019: LVEF 60-65%, mild RV syst dysf,biatrial enlgmt, mod pulm htn. 02/14/22 EF 55-60%, unable to assess diast fxn, LVH, o/w normal   URETERAL STENT PLACEMENT     virtual colonoscopy  01/2011   Normal    Outpatient Medications Prior to Visit  Medication Sig Dispense Refill   apixaban (ELIQUIS) 5 MG TABS tablet Take 5 mg by mouth 2 (two) times daily.     b complex vitamins capsule Take 1 capsule by mouth every morning.     calcium carbonate (OS-CAL - DOSED IN MG OF ELEMENTAL CALCIUM) 1250 (500 Ca) MG tablet Take 1 tablet (500 mg of elemental calcium total) by mouth 2 (two) times daily with a meal for 10 days. 20 tablet 0   cetirizine (ZYRTEC) 10 MG tablet Take 10 mg by mouth at bedtime.     diclofenac Sodium (VOLTAREN) 1 % GEL Apply 2 g topically 4 (four) times daily.     diltiazem (TIAZAC)  120 MG 24 hr capsule Take 1 capsule (120 mg total) by mouth daily. 90 capsule 3   glucose blood (ONETOUCH ULTRA) test strip CHECK BLOOD SUGAR TWICE  DAILY 200 strip 0   HYDROcodone-acetaminophen (NORCO/VICODIN) 5-325 MG tablet Take 1-2 tablets by mouth every 6 (six) hours as needed for moderate pain. 1-2 tabs po bid prn pain (Patient taking differently: Take 1 tablet by mouth at bedtime.) 10 tablet 0   insulin aspart protamine- aspart (NOVOLOG MIX 70/30) (70-30) 100 UNIT/ML injection Inject 0.3 mLs (30 Units total) into the skin 2 (two) times daily with a meal. 10 mL 0   Insulin Pen Needle (B-D ULTRAFINE III SHORT PEN) 31G X 8 MM MISC USE 1 PENNEEDLE TWICE DAILY 200 each 1   Multiple Vitamin (MULTIVITAMIN) tablet Take 1 tablet by mouth daily.  nystatin (MYCOSTATIN/NYSTOP) powder Apply 1 application. topically 3 (three) times daily as needed.     potassium chloride SA (KLOR-CON M20) 20 MEQ tablet Take one tablet three times per week. 45 tablet 2   senna-docusate (SENOKOT-S) 8.6-50 MG tablet Take 1 tablet by mouth at bedtime.     terazosin (HYTRIN) 10 MG capsule Take 10 mg by mouth at bedtime.     Torsemide 40 MG TABS Take 40 mg by mouth daily.     traMADol (ULTRAM) 50 MG tablet Take 50 mg by mouth daily at 6 (six) AM.     vitamin E 400 UNIT capsule Take 400 Units by mouth every morning. Vitamin E with Selenium     No facility-administered medications prior to visit.    Allergies  Allergen Reactions   Other Other (See Comments)    Sneezing, coughing   Hydralazine Other (See Comments)    Drowsiness/sedation/mental fog    ROS As per HPI  PE:    06/10/2022    2:25 PM 06/04/2022    2:35 PM 05/27/2022    1:46 PM  Vitals with BMI  Height  5\' 10"  5\' 10"   Weight  343 lbs 320 lbs  BMI  49.22 45.92  Systolic 137    Diastolic 66    Pulse 60       Physical Exam  Gen: Alert, well appearing.  Patient is oriented to person, place, time, and situation. AFFECT: pleasant, lucid thought and  speech. CV: RRR, no m/r/g.   LUNGS: CTA bilat, nonlabored resps, good aeration in all lung fields. EXT: bilat unna boots  LABS:  Last CBC Lab Results  Component Value Date   WBC 10.3 04/23/2022   HGB 13.4 04/23/2022   HCT 41.7 04/23/2022   MCV 68.0 (L) 04/23/2022   MCH 21.9 (L) 04/23/2022   RDW 21.3 (H) 04/23/2022   PLT 303 04/23/2022   Lab Results  Component Value Date   IRON 22 (L) 04/11/2022   TIBC 299 04/11/2022   FERRITIN 71 04/11/2022   Last metabolic panel Lab Results  Component Value Date   GLUCOSE 141 (H) 05/08/2022   NA 135 05/08/2022   K 4.5 05/08/2022   CL 99 05/08/2022   CO2 29 05/08/2022   BUN 25 (H) 05/08/2022   CREATININE 1.37 05/08/2022   GFRNONAA 51 (L) 04/26/2022   CALCIUM 8.0 (L) 05/08/2022   PHOS 4.2 02/14/2022   PROT 7.1 02/28/2022   ALBUMIN 2.8 (L) 02/28/2022   BILITOT 0.7 02/28/2022   ALKPHOS 96 02/28/2022   AST 25 02/28/2022   ALT 26 02/28/2022   ANIONGAP 8 04/26/2022   Last lipids Lab Results  Component Value Date   CHOL 101 04/04/2021   HDL 41 04/04/2021   LDLCALC 46 04/04/2021   TRIG 61 04/04/2021   CHOLHDL 2.5 04/04/2021   Last hemoglobin A1c Lab Results  Component Value Date   HGBA1C 8.1 (H) 04/11/2022   Last thyroid functions Lab Results  Component Value Date   TSH 5.86 (H) 04/11/2022   T3TOTAL 82 04/11/2022   IMPRESSION AND PLAN:  #1 chronic diastolic heart failure. His weight is up some over the last month but is still 20 pounds lighter than when he was in the hospital for acute CHF exacerbation earlier this year. Will continue current diuretic regimen of metolazone 5 mg a day and torsemide 40 mg a day.  Basic metabolic panel today   #2 recurrent UTI suspected. Patient unable to give a urine specimen today so we  will treat empirically.  #3 recurrent/chronic lower extremity cellulitis. Augmentin 875 twice daily x 10 days recommended--this was written on his care facility update sheet but since he does have a  medical provider attending him there I cannot prescribe it   #4 severe osteoarthritis both knees. He has tramadol that he uses every morning and Vicodin he uses every evening. He recently saw the orthopedist for second opinion--not surgical candidate, but they have referred him to Dr. Alvester Morin for consideration of geniculate nerve ablation.    #5 type 2 diabetes. He is on 7030 mix and his control historically has been fair. Next hemoglobin A1c 1 month.  An After Visit Summary was printed and given to the patient.  FOLLOW UP: Return in about 4 weeks (around 07/08/2022) for routine chronic illness f/u.  Signed:  Santiago Bumpers, MD           06/10/2022

## 2022-06-11 LAB — BASIC METABOLIC PANEL
BUN: 22 mg/dL (ref 6–23)
CO2: 31 mEq/L (ref 19–32)
Calcium: 8.7 mg/dL (ref 8.4–10.5)
Chloride: 95 mEq/L — ABNORMAL LOW (ref 96–112)
Creatinine, Ser: 1.81 mg/dL — ABNORMAL HIGH (ref 0.40–1.50)
GFR: 37.24 mL/min — ABNORMAL LOW (ref >60.00)
Glucose, Bld: 154 mg/dL — ABNORMAL HIGH (ref 70–99)
Potassium: 4.6 mEq/L (ref 3.5–5.1)
Sodium: 136 mEq/L (ref 135–145)

## 2022-06-12 ENCOUNTER — Telehealth: Payer: Self-pay

## 2022-06-12 DIAGNOSIS — Z760 Encounter for issue of repeat prescription: Secondary | ICD-10-CM | POA: Diagnosis not present

## 2022-06-12 DIAGNOSIS — G8929 Other chronic pain: Secondary | ICD-10-CM | POA: Diagnosis not present

## 2022-06-12 DIAGNOSIS — N1831 Chronic kidney disease, stage 3a: Secondary | ICD-10-CM

## 2022-06-12 DIAGNOSIS — L97519 Non-pressure chronic ulcer of other part of right foot with unspecified severity: Secondary | ICD-10-CM | POA: Diagnosis not present

## 2022-06-12 NOTE — Telephone Encounter (Signed)
-----   Message from [name], MD sent at 06/11/2022  5:09 PM EDT ----- Kidney function is down some from his baseline.  Nothing urgent but I want to decrease his diuretics.  Based on what his med list at his facility had listed, he takes torsemide 40mg  daily and metolazone 5mg  daily.  I recommend he decrease his metolazone to 2.5mg  daily. He has an MD at his facility that all orders must come from.  Pls see if we can get that provider to contact our office so we can make him aware of the lab results. Needs lab visit for non-fasting bmet in 1 week, dx chronic renal insufficiency stage IIIa.-thx

## 2022-06-13 ENCOUNTER — Ambulatory Visit (INDEPENDENT_AMBULATORY_CARE_PROVIDER_SITE_OTHER): Payer: Medicare Other | Admitting: Physical Medicine and Rehabilitation

## 2022-06-13 ENCOUNTER — Encounter: Payer: Self-pay | Admitting: Physical Medicine and Rehabilitation

## 2022-06-13 VITALS — BP 124/59 | HR 64

## 2022-06-13 DIAGNOSIS — M25562 Pain in left knee: Secondary | ICD-10-CM | POA: Diagnosis not present

## 2022-06-13 DIAGNOSIS — M25561 Pain in right knee: Secondary | ICD-10-CM | POA: Diagnosis not present

## 2022-06-13 DIAGNOSIS — M1712 Unilateral primary osteoarthritis, left knee: Secondary | ICD-10-CM

## 2022-06-13 DIAGNOSIS — G8929 Other chronic pain: Secondary | ICD-10-CM

## 2022-06-13 DIAGNOSIS — M1711 Unilateral primary osteoarthritis, right knee: Secondary | ICD-10-CM | POA: Diagnosis not present

## 2022-06-13 NOTE — Progress Notes (Signed)
Pt state both knees pain. Pt state standing makes the pain worse. Pt state he takes pain meds to help ease his pain. Pt state standing you can hear his knees popping.  Numeric Pain Rating Scale and Functional Assessment Average Pain 10 Pain Right Now 1 My pain is constant, sharp, and stabbing Pain is worse with: standing and some activites Pain improves with: rest and medication   In the last MONTH (on 0-10 scale) has pain interfered with the following?  1. General activity like being  able to carry out your everyday physical activities such as walking, climbing stairs, carrying groceries, or moving a chair?  Rating(5)  2. Relation with others like being able to carry out your usual social activities and roles such as  activities at home, at work and in your community. Rating(6)  3. Enjoyment of life such that you have  been bothered by emotional problems such as feeling anxious, depressed or irritable?  Rating(7)

## 2022-06-13 NOTE — Progress Notes (Signed)
Ahijah Devery Hughley - 71 y.o. male MRN [id number]  Date of birth: 20-Jan-1951  Office Visit Note: Visit Date: 06/13/2022 PCP: [name], MD Referred by: [name], MD  Subjective: Chief Complaint  Patient presents with   Right Knee - Pain   Left Knee - Pain   HPI: Chanceler Pullin Winski is a 71 y.o. male who comes in today per the request of Dr. Glee Arvin for evaluation and management of chronic, worsening and severe bilateral knee pain, left greater than right that has not been responsive to normal conservative orthopedic care.  Specifically Dr Roda Shutters request evaluation for radiofrequency ablation of the genicular nerves.. Pain ongoing for several years and is exacerbated by movement and activity. He describes his pain as sore and aching in nature, currently rates as 9 out of 10. Patient states he has been unable to bear weight on legs or stand/walk in several weeks. States he requires transfer board and gait belt when moving from wheelchair to bed. He presents to office in wheelchair. Patient reports some relief of pain with rest and use of medications. Patient currently resides at [facility] and [facility] and has not been able to participate in physical therapy for several weeks due to severe pain. Recent right knee x-ray imaging exhibits severe tricompartmental osteoarthritis. Patient was previously seen by Dr. Richardean Sale at [facility] where he underwent cortisone injections with no relief of pain. Per Dr. Edison Pace notes he does not feel he is a good candidate for HA injections or PRP. Patient was more recently seen by Dr. Roda Shutters in our practice and does not feel patient is a good surgical candidate due to body habitus and other co morbidities, he is recommending genicular nerve ablation. Patient states he would like to try ablation procedure in hopes that this will help with his mobility so that he can resume physical therapy treatments. Patient denies focal  weakness, numbness and tingling. Patient denies recent trauma or falls.   Patients course is complicated by morbid obesity, lymphedema bilateral lower extremities, chronic kidney disease, diabetes mellitus, hypertension, atrial fibrillation, and CHF. Currently taking Eliquis.    Review of Systems  Cardiovascular:  Positive for leg swelling.  Musculoskeletal:  Positive for joint pain.  Neurological:  Negative for tingling, sensory change, focal weakness and weakness.  All other systems reviewed and are negative.  Otherwise per HPI.  Assessment & Plan: Visit Diagnoses:    ICD-10-CM   1. Chronic pain of both knees  M25.561 Ambulatory referral to Physical Medicine Rehab   M25.562    G89.29     2. Primary osteoarthritis of right knee  M17.11 Ambulatory referral to Physical Medicine Rehab    3. Primary osteoarthritis of left knee  M17.12 Ambulatory referral to Physical Medicine Rehab    4. Morbid (severe) obesity due to excess calories (HCC)  E66.01 Ambulatory referral to Physical Medicine Rehab       Plan: Findings:  Chronic, worsening and severe bilateral knee pain, left greater than right. Patient continues to have severe pain despite good conservative therapies such as physical therapy, rest and use of medications.  While his current condition represents a good reason for contemplating radiofrequency ablation of the genicular nerves as a last resort type of procedure, his mobility issues really make him a poor candidate potentially for the procedure itself.   We did speak with patient about concerns regarding transfer to procedure table and instructed him to bring transfer board and gait belt  with him the day of injection procedure. If we are not able to safely transfer patient to procedure table we did discuss possibility of having procedure at at one of the ambulatory surgery center's with another physician such as Dr. Ethelene Hal at [facility] where there is transfer equipment and more staff  to help assist.  Next step is to perform diagnostic and hopefully therapeutic left genicular nerve block. If patient gets good relief of pain with diagnostic genicular nerve blocks we did discuss possibility of longer sustained pain relief with radiofrequency ablation procedure.    Meds & Orders: No orders of the defined types were placed in this encounter.   Orders Placed This Encounter  Procedures   Ambulatory referral to Physical Medicine Rehab    Follow-up: Return for Left genicular nerve block.   Procedures: No procedures performed      Clinical History: No specialty comments available.   He reports that he has never smoked. He has never been exposed to tobacco smoke. He has never used smokeless tobacco.  Recent Labs    10/10/21 1329 01/09/22 0911 04/05/22 1243 04/11/22 1348  HGBA1C 7.9* 8.4*  --  8.1*  LABURIC  --   --  10.2*  --     Objective:  VS:  HT:    WT:   BMI:     BP: (!) 124/59  HR:64bpm  TEMP: ( )  RESP:  Physical Exam Vitals and nursing note reviewed.  Constitutional:      Appearance: He is obese.  HENT:     Head: Normocephalic and atraumatic.     Right Ear: External ear normal.     Left Ear: External ear normal.     Nose: Nose normal.     Mouth/Throat:     Mouth: Mucous membranes are moist.  Eyes:     Pupils: Pupils are equal, round, and reactive to light.  Cardiovascular:     Rate and Rhythm: Normal rate.     Pulses: Normal pulses.  Pulmonary:     Effort: Pulmonary effort is normal.  Abdominal:     General: Abdomen is flat. There is no distension.  Musculoskeletal:        General: Tenderness present.     Cervical back: Normal range of motion.     Right lower leg: Edema present.     Left lower leg: Edema present.     Comments: Examination limited due to body habitus. Limited range of motion noted to bilateral knees, swelling noted to bilateral lower extremities. Bilateral lower legs are wrapped in dressings due to chronic venous stasis  wounds. Patient unable to bear weight on legs without assistance, he is using wheelchair.   Skin:    General: Skin is warm and dry.     Capillary Refill: Capillary refill takes less than 2 seconds.  Neurological:     Mental Status: He is alert and oriented to person, place, and time.     Gait: Gait abnormal.  Psychiatric:        Mood and Affect: Mood normal.        Behavior: Behavior normal.     Ortho Exam  Imaging: No results found.  Past Medical/Family/Surgical/Social History: Medications & Allergies reviewed per EMR, new medications updated. Patient Active Problem List   Diagnosis Date Noted   Primary osteoarthritis of right knee 06/04/2022   Primary osteoarthritis of left knee 06/04/2022   Body mass index 45.0-49.9, adult (HCC) 06/04/2022   AKI (acute kidney injury) (HCC) 04/25/2022  Acute renal failure superimposed on stage 3b chronic kidney disease ([place]) 04/23/2022   Hyponatremia 04/23/2022   Pressure injury of skin 03/07/2022   Acute kidney injury superimposed on chronic kidney disease ([place])    Obesity, Class III, BMI 40-49.9 (morbid obesity) ([place])    Acute on chronic diastolic CHF (congestive heart failure) ([place]) 02/13/2022   Uncontrolled type 2 diabetes mellitus with hyperglycemia ([place]) 03/04/2018   Chronic diastolic CHF (congestive heart failure) ([place]) 12/24/2016   Lymphedema 02/22/2016   Diabetes with neurologic complications ([place]) [id number]   Chronic headaches 05/10/2015   Equivocal stress test 10/24/2014   Persistent atrial fibrillation ([place]) 08/15/2014   Healthcare maintenance 08/15/2014   Type 2 diabetes mellitus with diabetic neuropathy ([place]) 08/15/2014   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   Recurrent cellulitis of lower extremity 08/02/2013   Lymphedema of both lower extremities 11/04/2008   Morbid obesity due to excess calories ([place]) 12/14/2007   Venous (peripheral) insufficiency 07/22/2007   Uncontrolled type 2 diabetes mellitus with hyperglycemia, with  long-term current use of insulin ([place]) 08/11/2006   Hypertension, essential 08/11/2006   ALLERGIC RHINITIS 08/11/2006   ASTHMA 08/11/2006   Asthma 08/11/2006   Past Medical History:  Diagnosis Date   ALLERGIC RHINITIS 08/11/2006   ASTHMA 08/11/2006   Bradycardia    Beta blocker d/c'd 2022   Chronic atrial fibrillation ([place]) 08/2014   Chronic combined systolic and diastolic CHF (congestive heart failure) [facility])    Cardiology f/u 12/2017: pt volume overloaded (R heart dysf suspected), BNP very high, lasix increased.  Repeat echo 12/2017: normal LV EF, mild DD, +RV syst dysfxn, mod pulm HTN, biatrial enlgmt.   Chronic constipation    Chronic renal insufficiency, stage 3 (moderate) (HCC)    Colon cancer screening 02/2021   02/2021 Cologuard POS->GI ref   DIABETES MELLITUS, TYPE II 08/11/2006   HYPERTENSION 08/11/2006   IDA (iron deficiency anemia)    04/2022.  Iron started.  Needs hemoccults   Impaired mobility and endurance    MRSA infection 06/2018   LL venous stasis ulcer infected   Normocytic anemia 2016-2019   03/2018 B12 normal, iron ok (ferritin borderline low).   OBESITY, MORBID 12/14/2007   saxenda started 05/2020 by [facility] wt mgmt center   OSA (obstructive sleep apnea)    to get sleep study with [facility]) as of 12/03/2018 consult.   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  92018 ID started him on amoxil prophylaxis---ineffective.  End 2018/Jan 2019 penicillin G injections prophyl helpful but pt declined to continue this as of 01/2018 ID f/u.  ID talked him into resuming monthly penicillin G as of 02/2018 f/u.     Restless leg syndrome    Rx'd clonazepam 09/2017 and pt refused to take it after reading the medication's potential side effects.   Venous stasis ulcers of both lower extremities (HCC)    Severe lymphedema.  wound clinic care ongoing as of 01/2018   Family History  Problem Relation Age of  Onset   Diabetes Mother    Diabetes Sister    Hydrocephalus Sister        NPH   Past Surgical History:  Procedure Laterality Date   Carotid dopplers  07/23/2018   Left NORMAL.  Right 1-39% ICA stenosis, with <50% distal CCA stenosis (not hemodynamically significant)   LE venous dopplers Bilateral 03/06/2022   no dvt   RIGHT HEART CATH N/A 02/19/2022   Marked volume overload, R>>L  HF, preserved CO. Procedure: RIGHT HEART CATH;  Surgeon: Jolaine Artist, MD;  Location: [place] [facility];  Service: Cardiovascular;  Laterality: N/A;   TRANSTHORACIC ECHOCARDIOGRAM  11/2007; 09/2014; 11/2015;12/2017   LV fxn normal, EF normal, mild dilation of left atrium.  2015 grade II diast dysfxn.  2017 EF 55-60%. 2019: LVEF 60-65%, mild RV syst dysf,biatrial enlgmt, mod pulm htn. 02/14/22 EF 55-60%, unable to assess diast fxn, LVH, o/w normal   URETERAL STENT PLACEMENT     virtual colonoscopy  01/2011   Normal   Social History   Occupational History   Occupation: retired  Tobacco Use   Smoking status: Never    Passive exposure: Never   Smokeless tobacco: Never  Vaping Use   Vaping Use: Never used  Substance and Sexual Activity   Alcohol use: No   Drug use: No   Sexual activity: Not Currently

## 2022-06-17 ENCOUNTER — Encounter (HOSPITAL_BASED_OUTPATIENT_CLINIC_OR_DEPARTMENT_OTHER): Payer: Self-pay | Admitting: Family

## 2022-06-17 ENCOUNTER — Ambulatory Visit (INDEPENDENT_AMBULATORY_CARE_PROVIDER_SITE_OTHER): Payer: Medicare Other | Admitting: Family

## 2022-06-17 VITALS — BP 100/56 | HR 66 | Ht 70.0 in

## 2022-06-17 DIAGNOSIS — I5032 Chronic diastolic (congestive) heart failure: Secondary | ICD-10-CM

## 2022-06-17 DIAGNOSIS — I4821 Permanent atrial fibrillation: Secondary | ICD-10-CM | POA: Diagnosis not present

## 2022-06-17 DIAGNOSIS — I272 Pulmonary hypertension, unspecified: Secondary | ICD-10-CM

## 2022-06-17 DIAGNOSIS — I10 Essential (primary) hypertension: Secondary | ICD-10-CM

## 2022-06-17 NOTE — Patient Instructions (Signed)
Medication Instructions:  Your Physician recommend you continue on your current medication as directed.    *If you need a refill on your cardiac medications before your next appointment, please call your pharmacy*  Follow-Up: At [facility], you and your health needs are our priority.  As part of our continuing mission to provide you with exceptional heart care, we have created designated Provider Care Teams.  These Care Teams include your primary Cardiologist (physician) and Advanced Practice Providers (APPs -  Physician Assistants and Nurse Practitioners) who all work together to provide you with the care you need, when you need it.  We recommend signing up for the patient portal called "MyChart".  Sign up information is provided on this After Visit Summary.  MyChart is used to connect with patients for Virtual Visits (Telemedicine).  Patients are able to view lab/test results, encounter notes, upcoming appointments, etc.  Non-urgent messages can be sent to your provider as well.   To learn more about what you can do with MyChart, go to [email or web address].    Your next appointment:   3 month(s)  The format for your next appointment:   In Person  Provider:   Pixie Casino, MD {  Other Instructions Exercises to do While Sitting  Exercises that you do while sitting (chair exercises) can give you many of the same benefits as full exercise. Benefits include strengthening your heart, burning calories, and keeping muscles and joints healthy. Exercise can also improve your mood and help with depression and anxiety. You may benefit from chair exercises if you are unable to do standing exercises due to: Diabetic foot pain. Obesity. Illness. Arthritis. Recovery from surgery or injury. Breathing problems. Balance problems. Another type of disability. Before starting chair exercises, check with your health care provider or a physical therapist to find out how much exercise you can  tolerate and which exercises are safe for you. If your health care provider approves: Start out slowly and build up over time. Aim to work up to about 10-20 minutes for each exercise session. Make exercise part of your daily routine. Drink water when you exercise. Do not wait until you are thirsty. Drink every 10-15 minutes. Stop exercising right away if you have pain, nausea, shortness of breath, or dizziness. If you are exercising in a wheelchair, make sure to lock the wheels. Ask your health care provider whether you can do tai chi or yoga. Many positions in these mind-body exercises can be modified to do while seated. Warm-up Before starting other exercises: Sit up as straight as you can. Have your knees bent at 90 degrees, which is the shape of the capital letter "L." Keep your feet flat on the floor. Sit at the front edge of your chair, if you can. Pull in (tighten) the muscles in your abdomen and stretch your spine and neck as straight as you can. Hold this position for a few minutes. Breathe in and out evenly. Try to concentrate on your breathing, and relax your mind. Stretching Exercise A: Arm stretch Hold your arms out straight in front of your body. Bend your hands at the wrist with your fingers pointing up, as if signaling someone to stop. Notice the slight tension in your forearms as you hold the position. Keeping your arms out and your hands bent, rotate your hands outward as far as you can and hold this stretch. Aim to have your thumbs pointing up and your pinkie fingers pointing down. Slowly repeat arm stretches for one  minute as tolerated. Exercise B: Leg stretch If you can move your legs, try to "draw" letters on the floor with the toes of your foot. Write your name with one foot. Write your name with the toes of your other foot. Slowly repeat the movements for one minute as tolerated. Exercise C: Reach for the sky Reach your hands as far over your head as you can to stretch  your spine. Move your hands and arms as if you are climbing a rope. Slowly repeat the movements for one minute as tolerated. Range of motion exercises Exercise A: Shoulder roll Let your arms hang loosely at your sides. Lift just your shoulders up toward your ears, then let them relax back down. When your shoulders feel loose, rotate your shoulders in backward and forward circles. Do shoulder rolls slowly for one minute as tolerated. Exercise B: March in place As if you are marching, pump your arms and lift your legs up and down. Lift your knees as high as you can. If you are unable to lift your knees, just pump your arms and move your ankles and feet up and down. March in place for one minute as tolerated. Exercise C: Seated jumping jacks Let your arms hang down straight. Keeping your arms straight, lift them up over your head. Aim to point your fingers to the ceiling. While you lift your arms, straighten your legs and slide your heels along the floor to your sides, as wide as you can. As you bring your arms back down to your sides, slide your legs back together. If you are unable to use your legs, just move your arms. Slowly repeat seated jumping jacks for one minute as tolerated. Strengthening exercises Exercise A: Shoulder squeeze Hold your arms straight out from your body to your sides, with your elbows bent and your fists pointed at the ceiling. Keeping your arms in the bent position, move them forward so your elbows and forearms meet in front of your face. Open your arms back out as wide as you can with your elbows still bent, until you feel your shoulder blades squeezing together. Hold for 5 seconds. Slowly repeat the movements forward and backward for one minute as tolerated. Contact a health care provider if: You have to stop exercising due to any of the following: Pain. Nausea. Shortness of breath. Dizziness. Fatigue. You have significant pain or soreness after  exercising. Get help right away if: You have chest pain. You have difficulty breathing. These symptoms may represent a serious problem that is an emergency. Do not wait to see if the symptoms will go away. Get medical help right away. Call your local emergency services (911 in the [place] Do not drive yourself to the hospital. Summary Exercises that you do while sitting (chair exercises) can strengthen your heart, burn calories, and keep muscles and joints healthy. You may benefit from chair exercises if you are unable to do standing exercises due to diabetic foot pain, obesity, recovery from surgery or injury, or other conditions. Before starting chair exercises, check with your health care provider or a physical therapist to find out how much exercise you can tolerate and which exercises are safe for you. This information is not intended to replace advice given to you by your health care provider. Make sure you discuss any questions you have with your health care provider. Document Revised: 12/17/2020 Document Reviewed: 12/17/2020 Else[employer] Patient Education  [place].

## 2022-06-17 NOTE — Progress Notes (Unsigned)
Office Visit    Patient Name: [name] Date of Encounter: 06/17/2022  PCP:  Tammi Sou, MD   [place]  Cardiologist:  Pixie Casino, MD  Advanced Practice Provider:  Almyra Deforest, [place] Electrophysiologist:  None      Chief Complaint    [name] is a 71 y.o. 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  Repeat echo 12/2017: normal LV EF, mild DD, +RV syst dysfxn, mod pulm HTN, biatrial enlgmt.   Chronic constipation    Chronic renal insufficiency, stage 3 (moderate) (HCC)    Colon cancer screening 02/2021   02/2021 Cologuard POS->GI ref   DIABETES MELLITUS, TYPE II 08/11/2006   HYPERTENSION 08/11/2006   IDA (iron deficiency anemia)    04/2022.  Iron started.  Needs hemoccults   Impaired mobility and endurance    MRSA infection 06/2018   LL venous stasis ulcer infected   Normocytic anemia 2016-2019   03/2018 B12 normal, iron ok (ferritin borderline low).   OBESITY, MORBID 12/14/2007   saxenda started 05/2020 by [facility] wt mgmt center   OSA (obstructive sleep apnea)    to get sleep study with [facility]) as of 12/03/2018 consult.   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  92018 ID started him on amoxil prophylaxis---ineffective.  End 2018/Jan 2019 penicillin G injections prophyl helpful but pt  declined to continue this as of 01/2018 ID f/u.  ID talked him into resuming monthly penicillin G as of 02/2018 f/u.     Restless leg syndrome    Rx'd clonazepam 09/2017 and pt refused to take it after reading the medication's potential side effects.   Venous stasis ulcers of both lower extremities (HCC)    Severe lymphedema.  wound clinic care ongoing as of 01/2018   Past Surgical History:  Procedure Laterality Date   Carotid dopplers  07/23/2018   Left NORMAL.  Right 1-39% ICA stenosis, with <50% distal CCA stenosis (not hemodynamically significant)   LE venous dopplers Bilateral 03/06/2022   no dvt   RIGHT HEART CATH N/A 02/19/2022   Marked volume overload, R>>L HF, preserved CO. Procedure: RIGHT HEART CATH;  Surgeon: Jolaine Artist, MD;  Location: [place] [facility];  Service: Cardiovascular;  Laterality: N/A;   TRANSTHORACIC ECHOCARDIOGRAM  11/2007; 09/2014; 11/2015;12/2017   LV fxn normal, EF normal, mild dilation of left atrium.  2015 grade II diast dysfxn.  2017 EF 55-60%. 2019: LVEF 60-65%, mild RV syst dysf,biatrial enlgmt, mod pulm htn. 02/14/22 EF 55-60%, unable to assess diast fxn, LVH, o/w normal   URETERAL STENT PLACEMENT     virtual colonoscopy  01/2011   Normal    Allergies  Allergies  Allergen Reactions   Other Other (See Comments)    Sneezing, coughing   Hydralazine Other (See Comments)    Drowsiness/sedation/mental fog    History of Present Illness  [name] is a 71 y.o. 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  He noted eating a lot of high sodium meals while staying in a hotel waiting to move to a new apartment.  Diuresed with IV Lasix and treated for UTI.  Echo with EF 55 to 60%, RV not well visualized.  RHC with RA 18, PA 63/19 (35), Fick CO/Cl  10.5/3.8, Thermo CO/Cl 7.2/2.6, PVR 1.9 WU (thermo), PAPi 2.4.  There is suspected pulmonary hypertension likely due to combination of left heart disease and primary pulmonary hypertension related to untreated OSA and obesity.  He had a 78 pound weight loss during admission.  Discharged on torsemide 40 mg daily.  Discharge weight 333 pounds.  Initial TOC visit had gained 20pounds at SNF. Torsemide increased to 80mg  QD. Seen 04/05/22 still volume overloaded. Recommended take dose of metolazone for 2 days then transition to once per week with potassium.   Seen 04/16/22 noted to be taking Metolazone 2.5mg  and Torsemide 80mg  QD. Metolazone reduced to twice per week. Follow up labs 04/23/22 with creatinine 2.74, K 126, GFR 24 recommended for admission.   Hospitalized 6/20-6/22/23 given 500cc bolus and 100 mL/hr normal saline. Metolazone and Torsemide held. He was discharge don Torsemide. Discharge weight 139.7 kg.   He presents today for follow up independently. Remains at SNF and slow progress in mobility, unable to stand to weigh. Frustrated that PT has been discontinued. He has bilateral LE Unna boots and notes improvement in edema. Dyspnea on exertion stable at baseline.   EKGs/Labs/Other Studies Reviewed:   The following studies were reviewed today:  EKG:  No EKG today.  Recent Labs: 02/28/2022: ALT 26 04/11/2022: TSH 5.86 04/23/2022: B Natriuretic Peptide 162.0; Hemoglobin 13.4; Magnesium 2.0; Platelets 303 06/10/2022: BUN 22; Creatinine, Ser 1.81; Potassium 4.6; Sodium 136  Recent Lipid Panel    Component Value Date/Time   CHOL 101 04/04/2021 0844   TRIG 61 04/04/2021 0844   HDL 41 04/04/2021 0844   CHOLHDL 2.5 04/04/2021 0844   VLDL 12.2 07/12/2020 1015   LDLCALC 46 04/04/2021 0844    Risk Assessment/Calculations:   CHA2DS2-VASc Score =     This indicates a  % annual risk of stroke. The patient's score is based upon:       Home Medications   Current Meds  Medication Sig    apixaban (ELIQUIS) 5 MG TABS tablet Take 5 mg by mouth 2 (two) times daily.   b complex vitamins capsule Take 1 capsule by mouth every morning.   cetirizine (ZYRTEC) 10 MG tablet Take 10 mg by mouth at bedtime.   clotrimazole-betamethasone (LOTRISONE) cream Apply 1 Application topically daily.   diclofenac Sodium (VOLTAREN) 1 % GEL Apply 2 g topically 4 (four) times daily.   diltiazem (TIAZAC) 120 MG 24 hr capsule Take 1 capsule (120 mg total) by mouth daily.   glucose blood (ONETOUCH ULTRA) test strip CHECK BLOOD SUGAR TWICE  DAILY   HYDROcodone-acetaminophen (NORCO/VICODIN) 5-325 MG tablet Take 1-2 tablets by mouth every 6 (six) hours as needed for moderate pain. 1-2 tabs po bid prn pain (Patient taking differently: Take 1 tablet by mouth at bedtime.)   insulin aspart protamine- aspart (NOVOLOG MIX 70/30) (70-30) 100 UNIT/ML injection Inject 0.3 mLs (30 Units total) into the skin 2 (two) times daily with a meal.  Insulin Pen Needle (B-D ULTRAFINE III SHORT PEN) 31G X 8 MM MISC USE 1 PENNEEDLE TWICE DAILY   metolazone (ZAROXOLYN) 5 MG tablet Take 5 mg by mouth daily. In combination with Torsemide   Multiple Vitamin (MULTIVITAMIN) tablet Take 1 tablet by mouth daily.   nystatin (MYCOSTATIN/NYSTOP) powder Apply 1 application. topically 3 (three) times daily as needed.   potassium chloride SA (KLOR-CON M20) 20 MEQ tablet Take one tablet three times per week.   senna-docusate (SENOKOT-S) 8.6-50 MG tablet Take 1 tablet by mouth at bedtime.   terazosin (HYTRIN) 10 MG capsule Take 10 mg by mouth at bedtime.   Torsemide 40 MG TABS Take 40 mg by mouth daily.   traMADol (ULTRAM) 50 MG tablet Take 50 mg by mouth daily at 6 (six) AM.   vitamin E 400 UNIT capsule Take 400 Units by mouth every morning. Vitamin E with Selenium     Review of Systems      All other systems reviewed and are otherwise negative except as noted above.  Physical Exam    VS:  BP (!) 100/56   Pulse 66   Ht 5\' 10"  (1.778 m)    BMI 49.22 kg/m  , BMI Body mass index is 49.22 kg/m.  Wt Readings from Last 3 Encounters:  06/04/22 (!) 343 lb (155.6 kg)  05/27/22 (!) 320 lb (145.2 kg)  05/08/22 (!) 320 lb (145.2 kg)     GEN: Well nourished, overweight, well developed, in no acute distress. HEENT: normal. Neck: Supple, no JVD, carotid bruits, or masses. Cardiac: IRIR, no murmurs, rubs, or gallops. No clubbing, cyanosis, edema.  Radials/PT 2+ and equal bilaterally.  Respiratory:  Respirations regular and unlabored, clear to auscultation bilaterally. GI: Soft, nontender, nondistended. MS: No deformity or atrophy. Skin: Warm and dry, no rash.  Bilateral lower extremity Unna boots in place. Neuro:  Strength and sensation are intact. Psych: Normal affect.  Assessment & Plan    HFpEF / Right sided heart failure / Pulmonary hypertension -admission 02/2022 diuresed 78 pounds - discharge weight 233 lbs  Volume status difficult to ascertain due to body habitus.Recent admission 04/2022 with discharge weight 139.7 kg (307 lbs). PCP recommended reducing Metolazone from 5 to 2.5mg  on 06/10/22 but based on paperwork from SNF today this change has not been made. Will defer changes until upcoming labs with PCP later this week.  Unfortunately last weight from SNF received are 63 days old, detailed below. Continue low-sodium diet, fluid restriction - he reports food not low salt at facility.  Continue daily weights at SNF. If renal function and diuresis remain difficult, may benefit from nephrology consult.   DATE WEIGHT  8/10 336.9  8/9 336.8  8/5 342  8/4 342.8  8/3 339.4  8/2 340  7/30 343  7/27 350.6  7/26 350.1  7/23 341.5   Snores - Sleep study when discharged from SNF. Given RHC and body habitus, plan for in lab split night sleep study. Coordinate at follow up as still at SNF and mobility not yet adequate to transfer to bed independently.   Longstanding persistent atrial fibrillation -asymptomatic in regards to atrial  fibrillation with no palpitations.  Rate controlled today continue diltiazem 120 mg daily.  Continue Eliquis 5 mg twice daily.  Denies bleeding complications.  Does not meet dose reduction criteria.   CKDIIIb - Careful titration of diuretic and antihypertensive.  Upcoming labs with PCP for monitoring.   DM2 - Currently managed at SNF. Insulin dependent.   HTN -  Now with relative hypotension though asymptomatic with no lightheadedness. Continue present antihypertensive regimen.   Lymphedem with bilateral LE wounds - reports healing well. Bilateral LE with Unna boots in place. No significant edema appreciated on exam.   OA - Limits mobility. Participating in PT at SNF. Upcoming radiofrequency ablation. He is deemed appropriate for the planned procedure.    Disposition: Follow up as scheduled with Pixie Casino, MD or APP.  Signed, Loel Dubonnet, NP 06/17/2022, 10:43 AM [place]

## 2022-06-18 ENCOUNTER — Encounter (HOSPITAL_BASED_OUTPATIENT_CLINIC_OR_DEPARTMENT_OTHER): Payer: Self-pay | Admitting: Family

## 2022-06-19 ENCOUNTER — Other Ambulatory Visit (INDEPENDENT_AMBULATORY_CARE_PROVIDER_SITE_OTHER): Payer: Medicare Other

## 2022-06-19 DIAGNOSIS — N1831 Chronic kidney disease, stage 3a: Secondary | ICD-10-CM | POA: Diagnosis not present

## 2022-06-19 LAB — BASIC METABOLIC PANEL
BUN: 28 mg/dL — ABNORMAL HIGH (ref 6–23)
CO2: 27 mEq/L (ref 19–32)
Calcium: 8.5 mg/dL (ref 8.4–10.5)
Chloride: 96 mEq/L (ref 96–112)
Creatinine, Ser: 1.37 mg/dL (ref 0.40–1.50)
GFR: 52.01 mL/min — ABNORMAL LOW (ref >60.00)
Glucose, Bld: 238 mg/dL — ABNORMAL HIGH (ref 70–99)
Potassium: 4.1 mEq/L (ref 3.5–5.1)
Sodium: 135 mEq/L (ref 135–145)

## 2022-06-20 ENCOUNTER — Ambulatory Visit: Payer: Medicare Other | Admitting: Family Medicine

## 2022-06-24 DIAGNOSIS — K5900 Constipation, unspecified: Secondary | ICD-10-CM | POA: Diagnosis not present

## 2022-06-26 DIAGNOSIS — L97519 Non-pressure chronic ulcer of other part of right foot with unspecified severity: Secondary | ICD-10-CM | POA: Diagnosis not present

## 2022-06-26 DIAGNOSIS — B372 Candidiasis of skin and nail: Secondary | ICD-10-CM | POA: Diagnosis not present

## 2022-07-01 DIAGNOSIS — Z760 Encounter for issue of repeat prescription: Secondary | ICD-10-CM | POA: Diagnosis not present

## 2022-07-01 DIAGNOSIS — G8929 Other chronic pain: Secondary | ICD-10-CM | POA: Diagnosis not present

## 2022-07-02 DIAGNOSIS — I509 Heart failure, unspecified: Secondary | ICD-10-CM | POA: Diagnosis not present

## 2022-07-02 DIAGNOSIS — I4891 Unspecified atrial fibrillation: Secondary | ICD-10-CM | POA: Diagnosis not present

## 2022-07-02 DIAGNOSIS — G2581 Restless legs syndrome: Secondary | ICD-10-CM | POA: Diagnosis not present

## 2022-07-04 ENCOUNTER — Ambulatory Visit: Payer: Self-pay

## 2022-07-04 ENCOUNTER — Ambulatory Visit (INDEPENDENT_AMBULATORY_CARE_PROVIDER_SITE_OTHER): Payer: Medicare Other | Admitting: Physical Medicine and Rehabilitation

## 2022-07-04 ENCOUNTER — Telehealth: Payer: Self-pay | Admitting: Orthopaedic Surgery

## 2022-07-04 ENCOUNTER — Other Ambulatory Visit: Payer: Self-pay

## 2022-07-04 ENCOUNTER — Encounter: Payer: Self-pay | Admitting: Physical Medicine and Rehabilitation

## 2022-07-04 DIAGNOSIS — G8929 Other chronic pain: Secondary | ICD-10-CM

## 2022-07-04 DIAGNOSIS — M1712 Unilateral primary osteoarthritis, left knee: Secondary | ICD-10-CM

## 2022-07-04 MED ORDER — BUPIVACAINE HCL 0.5 % IJ SOLN: 3.0000 mL | Freq: Once | INTRAMUSCULAR | Status: AC

## 2022-07-04 NOTE — Telephone Encounter (Signed)
Pt came up here to get a nerve block with Dr.Newton and was unable to get up on the table. [name] states he needs to be referred to the surgical center.   Cb [phone] [id number]

## 2022-07-04 NOTE — Progress Notes (Signed)
Patient's case is complicated by morbid obesity and uncontrolled type 2 diabetes with neurologic complications and osteoarthritis of both knees.  He felt like when we first saw him in the office that he could transfer from his chair over to the fluoroscopic table using a sliding board.  Unfortunately the table does not go down far enough and we were unable to transfer him over.  He reports not really remembering the last time he was able to actually stand up and push off the chair to stand up.  At this point we are going to request Dr. Glee Arvin to make referral to Dr. Sheran Luz to see if this could be done at the surgery center.  Numeric Pain Rating Scale and Functional Assessment Average Pain 5   In the last MONTH (on 0-10 scale) has pain interfered with the following?  1. General activity like being  able to carry out your everyday physical activities such as walking, climbing stairs, carrying groceries, or moving a chair?  Rating(9)   +Driver, -BT, -Dye Allergies.

## 2022-07-09 NOTE — Telephone Encounter (Signed)
Pt called again

## 2022-07-09 NOTE — Telephone Encounter (Signed)
Adelina Mings advised patient that referral to Dr.Ramos was made on 07/04/2022. He can contact Dr.Ramos at [facility] to see if they can get this done quickly.

## 2022-07-10 ENCOUNTER — Ambulatory Visit (INDEPENDENT_AMBULATORY_CARE_PROVIDER_SITE_OTHER): Payer: Medicare Other | Admitting: Family Medicine

## 2022-07-10 ENCOUNTER — Encounter: Payer: Self-pay | Admitting: Family Medicine

## 2022-07-10 VITALS — BP 127/71 | HR 53 | Temp 97.5°F | Ht 70.0 in | Wt 345.8 lb

## 2022-07-10 DIAGNOSIS — N2889 Other specified disorders of kidney and ureter: Secondary | ICD-10-CM

## 2022-07-10 DIAGNOSIS — N183 Chronic kidney disease, stage 3 unspecified: Secondary | ICD-10-CM | POA: Diagnosis not present

## 2022-07-10 DIAGNOSIS — Z87440 Personal history of urinary (tract) infections: Secondary | ICD-10-CM | POA: Diagnosis not present

## 2022-07-10 DIAGNOSIS — I5032 Chronic diastolic (congestive) heart failure: Secondary | ICD-10-CM | POA: Diagnosis not present

## 2022-07-10 DIAGNOSIS — E1121 Type 2 diabetes mellitus with diabetic nephropathy: Secondary | ICD-10-CM | POA: Diagnosis not present

## 2022-07-10 DIAGNOSIS — R300 Dysuria: Secondary | ICD-10-CM | POA: Diagnosis not present

## 2022-07-10 DIAGNOSIS — Z23 Encounter for immunization: Secondary | ICD-10-CM | POA: Diagnosis not present

## 2022-07-10 LAB — POCT GLYCOSYLATED HEMOGLOBIN (HGB A1C)
HbA1c POC (<> result, manual entry): 7.6 % (ref 4.0–5.6)
HbA1c, POC (controlled diabetic range): 7.6 % — AB (ref 0.0–7.0)
HbA1c, POC (prediabetic range): 7.6 % — AB (ref 5.7–6.4)
Hemoglobin A1C: 7.6 % — AB (ref 4.0–5.6)

## 2022-07-10 NOTE — Progress Notes (Signed)
OFFICE VISIT  07/10/2022  CC:  Chief Complaint  Patient presents with   Diabetes   Chronic Kidney Disease   Patient is a 71 y.o. male who presents for 1 mo follow-up UTI, leg cellulitis, diabetes, diastolic heart failure, renal insufficiency. A/P as of last visit: "#1 chronic diastolic heart failure. His weight is up some over the last month but is still 20 pounds lighter than when he was in the hospital for acute CHF exacerbation earlier this year. Will continue current diuretic regimen of metolazone 5 mg a day and torsemide 40 mg a day.  Basic metabolic panel today    #2 recurrent UTI suspected. Patient unable to give a urine specimen today so we will treat empirically.   #3 recurrent/chronic lower extremity cellulitis. Augmentin 875 twice daily x 10 days recommended--this was written on his care facility update sheet but since he does have a medical provider attending him there I cannot prescribe it    #4 severe osteoarthritis both knees. He has tramadol that he uses every morning and Vicodin he uses every evening. He recently saw the orthopedist for second opinion--not surgical candidate, but they have referred him to Dr. Alvester Morin for consideration of geniculate nerve ablation.     #5 type 2 diabetes. He is on 7030 mix and his control historically has been fair. Next hemoglobin A1c 1 month."  INTERIM HX: Just he says he is doing pretty well. He is currently under the care of medical providers at his assisted living facility. He states no recent changes in his medication regimen.  He is being set up to get left knee genicular nerve ablation pretty soon. Currently his pain is controlled with 1 tramadol in the morning and 1 hydrocodone in the evening.  He does feel like he is had more water weight gain since I saw him last.  Chronic urinary urgency.  Feels like symptoms of UTI have returned. Has had some URI symptoms lately.  PMP AWARE reviewed today: most recent rx for  Vicodin was filled to 723, #60, rx by me. No red flags  ROS as above, plus--> no fevers, no CP, no SOB, no wheezing, no cough, no dizziness, no HAs, no rashes, no melena/hematochezia.  No polyuria or polydipsia.  No myalgias or arthralgias.  No focal weakness, paresthesias, or tremors.  No acute vision or hearing abnormalities.   No n/v/d or abd pain.  No palpitations.     Past Medical History:  Diagnosis Date   ALLERGIC RHINITIS 08/11/2006   ASTHMA 08/11/2006   Bradycardia    Beta blocker d/c'd 2022   Chronic atrial fibrillation (HCC) 08/2014   Chronic combined systolic and diastolic CHF (congestive heart failure) [facility])    Cardiology f/u 12/2017: pt volume overloaded (R heart dysf suspected), BNP very high, lasix increased.  Repeat echo 12/2017: normal LV EF, mild DD, +RV syst dysfxn, mod pulm HTN, biatrial enlgmt.   Chronic constipation    Chronic renal insufficiency, stage 3 (moderate) (HCC)    Colon cancer screening 02/2021   02/2021 Cologuard POS->GI ref   DIABETES MELLITUS, TYPE II 08/11/2006   HYPERTENSION 08/11/2006   IDA (iron deficiency anemia)    04/2022.  Iron started.  Needs hemoccults   Impaired mobility and endurance    MRSA infection 06/2018   LL venous stasis ulcer infected   Normocytic anemia 2016-2019   03/2018 B12 normal, iron ok (ferritin borderline low).   OBESITY, MORBID 12/14/2007   saxenda started 05/2020 by [facility] wt mgmt center  OSA (obstructive sleep apnea)    to get sleep study with [facility]) as of 12/03/2018 consult.   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  92018 ID started him on amoxil prophylaxis---ineffective.  End 2018/Jan 2019 penicillin G injections prophyl helpful but pt declined to continue this as of 01/2018 ID f/u.  ID talked him into resuming monthly penicillin G as of 02/2018 f/u.     Restless leg syndrome    Rx'd clonazepam 09/2017 and pt refused to take it  after reading the medication's potential side effects.   Venous stasis ulcers of both lower extremities (HCC)    Severe lymphedema.  wound clinic care ongoing as of 01/2018    Past Surgical History:  Procedure Laterality Date   Carotid dopplers  07/23/2018   Left NORMAL.  Right 1-39% ICA stenosis, with <50% distal CCA stenosis (not hemodynamically significant)   LE venous dopplers Bilateral 03/06/2022   no dvt   RIGHT HEART CATH N/A 02/19/2022   Marked volume overload, R>>L HF, preserved CO. Procedure: RIGHT HEART CATH;  Surgeon: Jolaine Artist, MD;  Location: [place] [facility];  Service: Cardiovascular;  Laterality: N/A;   TRANSTHORACIC ECHOCARDIOGRAM  11/2007; 09/2014; 11/2015;12/2017   LV fxn normal, EF normal, mild dilation of left atrium.  2015 grade II diast dysfxn.  2017 EF 55-60%. 2019: LVEF 60-65%, mild RV syst dysf,biatrial enlgmt, mod pulm htn. 02/14/22 EF 55-60%, unable to assess diast fxn, LVH, o/w normal   URETERAL STENT PLACEMENT     virtual colonoscopy  01/2011   Normal    Outpatient Medications Prior to Visit  Medication Sig Dispense Refill   apixaban (ELIQUIS) 5 MG TABS tablet Take 5 mg by mouth 2 (two) times daily.     b complex vitamins capsule Take 1 capsule by mouth every morning.     cetirizine (ZYRTEC) 10 MG tablet Take 10 mg by mouth at bedtime.     clotrimazole-betamethasone (LOTRISONE) cream Apply 1 Application topically daily.     diclofenac Sodium (VOLTAREN) 1 % GEL Apply 2 g topically 4 (four) times daily.     diltiazem (TIAZAC) 120 MG 24 hr capsule Take 1 capsule (120 mg total) by mouth daily. 90 capsule 3   glucose blood (ONETOUCH ULTRA) test strip CHECK BLOOD SUGAR TWICE  DAILY 200 strip 0   HYDROcodone-acetaminophen (NORCO/VICODIN) 5-325 MG tablet Take 1-2 tablets by mouth every 6 (six) hours as needed for moderate pain. 1-2 tabs po bid prn pain (Patient taking differently: Take 1 tablet by mouth at bedtime.) 10 tablet 0   insulin aspart protamine-  aspart (NOVOLOG MIX 70/30) (70-30) 100 UNIT/ML injection Inject 0.3 mLs (30 Units total) into the skin 2 (two) times daily with a meal. 10 mL 0   Insulin Pen Needle (B-D ULTRAFINE III SHORT PEN) 31G X 8 MM MISC USE 1 PENNEEDLE TWICE DAILY 200 each 1   metolazone (ZAROXOLYN) 5 MG tablet Take 5 mg by mouth daily. In combination with Torsemide     Multiple Vitamin (MULTIVITAMIN) tablet Take 1 tablet by mouth daily.     nystatin (MYCOSTATIN/NYSTOP) powder Apply 1 application. topically 3 (three) times daily as needed.     potassium chloride SA (KLOR-CON M20) 20 MEQ tablet Take one tablet three times per week. 45 tablet 2   senna-docusate (SENOKOT-S) 8.6-50 MG tablet Take 1 tablet by mouth at bedtime.     terazosin (HYTRIN) 10 MG capsule Take 10  mg by mouth at bedtime.     Torsemide 40 MG TABS Take 40 mg by mouth daily.     traMADol (ULTRAM) 50 MG tablet Take 50 mg by mouth daily at 6 (six) AM.     vitamin E 400 UNIT capsule Take 400 Units by mouth every morning. Vitamin E with Selenium     calcium carbonate (OS-CAL - DOSED IN MG OF ELEMENTAL CALCIUM) 1250 (500 Ca) MG tablet Take 1 tablet (500 mg of elemental calcium total) by mouth 2 (two) times daily with a meal for 10 days. 20 tablet 0   Facility-Administered Medications Prior to Visit  Medication Dose Route Frequency Provider Last Rate Last Admin   bupivacaine (MARCAINE) 0.5 % (with pres) injection 3 mL  3 mL Other Once Tyrell Antonio, MD        Allergies  Allergen Reactions   Other Other (See Comments)    Sneezing, coughing   Hydralazine Other (See Comments)    Drowsiness/sedation/mental fog    ROS As per HPI  PE:    07/10/2022    1:45 PM 06/17/2022   10:19 AM 06/13/2022   10:12 AM  Vitals with BMI  Height 5\' 10"  5\' 10"    Weight 345 lbs 13 oz    BMI 49.62    Systolic 127 100  Diastolic 71 56 59  Pulse 53 66 64     Physical Exam  Gen: Alert, well appearing.  Patient is oriented to person, place, time, and  situation.a AFFECT: pleasant, lucid thought and speech. No further exam today.  LABS:  Last CBC Lab Results  Component Value Date   WBC 10.3 04/23/2022   HGB 13.4 04/23/2022   HCT 41.7 04/23/2022   MCV 68.0 (L) 04/23/2022   MCH 21.9 (L) 04/23/2022   RDW 21.3 (H) 04/23/2022   PLT 303 04/23/2022   Lab Results  Component Value Date   IRON 22 (L) 04/11/2022   TIBC 299 04/11/2022   FERRITIN 71 04/11/2022   Last metabolic panel Lab Results  Component Value Date   GLUCOSE 238 (H) 06/19/2022   NA 135 06/19/2022   K 4.1 06/19/2022   CL 96 06/19/2022   CO2 27 06/19/2022   BUN 28 (H) 06/19/2022   CREATININE 1.37 06/19/2022   GFRNONAA 51 (L) 04/26/2022   CALCIUM 8.5 06/19/2022   PHOS 4.2 02/14/2022   PROT 7.1 02/28/2022   ALBUMIN 2.8 (L) 02/28/2022   BILITOT 0.7 02/28/2022   ALKPHOS 96 02/28/2022   AST 25 02/28/2022   ALT 26 02/28/2022   ANIONGAP 8 04/26/2022   Last hemoglobin A1c Lab Results  Component Value Date   HGBA1C 7.6 (A) 07/10/2022   HGBA1C 7.6 07/10/2022   HGBA1C 7.6 (A) 07/10/2022   HGBA1C 7.6 (A) 07/10/2022   IMPRESSION AND PLAN:  #1 chronic diastolic heart failure with pulmonary hypertension. Gradual weight gain is worrisome.  Unfortunately, I have no say in his diuretic regimen.  He has managed by providers at his assisted living facility and when I have made recommendations in the past they have not been done. At this point he will continue metolazone 5 mg a day and torsemide 40 mg a day.  Unfortunately his diet is not low-salt at his assisted living facility.  #2 type 2 diabetes.  His control is pretty good on 30 units of 7030 twice a day. POC Hba1c today is 7.6%.  No changes recommended.  3 chronic renal insufficiency stage IIIb. Renal function and potassium stable 3  weeks ago.  No recent change in diuretic regimen. We will repeat renal function at next follow-up in 3 months.  An After Visit Summary was printed and given to the patient.  FOLLOW  UP: No follow-ups on file.  Signed:  Crissie Sickles, MD           07/10/2022

## 2022-07-11 ENCOUNTER — Encounter: Payer: Self-pay | Admitting: Family Medicine

## 2022-07-12 ENCOUNTER — Ambulatory Visit: Payer: Medicare Other | Admitting: Orthopaedic Surgery

## 2022-07-12 DIAGNOSIS — G2581 Restless legs syndrome: Secondary | ICD-10-CM | POA: Diagnosis not present

## 2022-07-12 DIAGNOSIS — G8929 Other chronic pain: Secondary | ICD-10-CM | POA: Diagnosis not present

## 2022-07-12 DIAGNOSIS — N390 Urinary tract infection, site not specified: Secondary | ICD-10-CM | POA: Diagnosis not present

## 2022-07-12 DIAGNOSIS — M25569 Pain in unspecified knee: Secondary | ICD-10-CM | POA: Diagnosis not present

## 2022-07-12 DIAGNOSIS — Z760 Encounter for issue of repeat prescription: Secondary | ICD-10-CM | POA: Diagnosis not present

## 2022-07-13 ENCOUNTER — Emergency Department (HOSPITAL_COMMUNITY): Payer: Medicare Other

## 2022-07-13 ENCOUNTER — Other Ambulatory Visit: Payer: Self-pay

## 2022-07-13 ENCOUNTER — Emergency Department (HOSPITAL_COMMUNITY)
Admission: EM | Admit: 2022-07-13 | Discharge: 2022-07-14 | Disposition: A | Discharge status: Skilled Nursing Facility | Payer: Medicare Other | Attending: Emergency Medicine | Admitting: Emergency Medicine

## 2022-07-13 ENCOUNTER — Encounter (HOSPITAL_COMMUNITY): Payer: Self-pay | Admitting: Emergency Medicine

## 2022-07-13 DIAGNOSIS — D7389 Other diseases of spleen: Secondary | ICD-10-CM | POA: Diagnosis not present

## 2022-07-13 DIAGNOSIS — K5730 Diverticulosis of large intestine without perforation or abscess without bleeding: Secondary | ICD-10-CM | POA: Diagnosis not present

## 2022-07-13 DIAGNOSIS — Z794 Long term (current) use of insulin: Secondary | ICD-10-CM | POA: Diagnosis not present

## 2022-07-13 DIAGNOSIS — Z87440 Personal history of urinary (tract) infections: Secondary | ICD-10-CM | POA: Diagnosis not present

## 2022-07-13 DIAGNOSIS — R109 Unspecified abdominal pain: Secondary | ICD-10-CM | POA: Diagnosis present

## 2022-07-13 DIAGNOSIS — I10 Essential (primary) hypertension: Secondary | ICD-10-CM | POA: Insufficient documentation

## 2022-07-13 DIAGNOSIS — M5489 Other dorsalgia: Secondary | ICD-10-CM | POA: Diagnosis not present

## 2022-07-13 DIAGNOSIS — K8020 Calculus of gallbladder without cholecystitis without obstruction: Secondary | ICD-10-CM | POA: Diagnosis not present

## 2022-07-13 DIAGNOSIS — R300 Dysuria: Secondary | ICD-10-CM | POA: Diagnosis not present

## 2022-07-13 DIAGNOSIS — Z7901 Long term (current) use of anticoagulants: Secondary | ICD-10-CM | POA: Diagnosis not present

## 2022-07-13 DIAGNOSIS — N3001 Acute cystitis with hematuria: Secondary | ICD-10-CM

## 2022-07-13 DIAGNOSIS — R6889 Other general symptoms and signs: Secondary | ICD-10-CM | POA: Diagnosis not present

## 2022-07-13 DIAGNOSIS — R161 Splenomegaly, not elsewhere classified: Secondary | ICD-10-CM | POA: Diagnosis not present

## 2022-07-13 DIAGNOSIS — E119 Type 2 diabetes mellitus without complications: Secondary | ICD-10-CM | POA: Insufficient documentation

## 2022-07-13 DIAGNOSIS — Z743 Need for continuous supervision: Secondary | ICD-10-CM | POA: Diagnosis not present

## 2022-07-13 LAB — URINALYSIS, ROUTINE W REFLEX MICROSCOPIC
Bilirubin Urine: NEGATIVE
Glucose, UA: NEGATIVE mg/dL
Ketones, ur: NEGATIVE mg/dL
Nitrite: POSITIVE — AB
Protein, ur: NEGATIVE mg/dL
Specific Gravity, Urine: 1.006 (ref 1.005–1.030)
WBC, UA: >50 WBC/hpf — ABNORMAL HIGH (ref 0–5)
pH: 5 (ref 5.0–8.0)

## 2022-07-13 LAB — CBC WITH DIFFERENTIAL/PLATELET
Abs Immature Granulocytes: 0.03 10*3/uL (ref 0.00–0.07)
Basophils Absolute: 0 10*3/uL (ref 0.0–0.1)
Basophils Relative: 0 %
Eosinophils Absolute: 0.2 10*3/uL (ref 0.0–0.5)
Eosinophils Relative: 2 %
HCT: 38.6 % — ABNORMAL LOW (ref 39.0–52.0)
Hemoglobin: 12 g/dL — ABNORMAL LOW (ref 13.0–17.0)
Immature Granulocytes: 0 %
Lymphocytes Relative: 16 %
Lymphs Abs: 1.3 10*3/uL (ref 0.7–4.0)
MCH: 24.6 pg — ABNORMAL LOW (ref 26.0–34.0)
MCHC: 31.1 g/dL (ref 30.0–36.0)
MCV: 79.1 fL — ABNORMAL LOW (ref 80.0–100.0)
Monocytes Absolute: 0.7 10*3/uL (ref 0.1–1.0)
Monocytes Relative: 9 %
Neutro Abs: 6 10*3/uL (ref 1.7–7.7)
Neutrophils Relative %: 73 %
Platelets: 245 10*3/uL (ref 150–400)
RBC: 4.88 MIL/uL (ref 4.22–5.81)
RDW: 18.4 % — ABNORMAL HIGH (ref 11.5–15.5)
WBC: 8.2 10*3/uL (ref 4.0–10.5)
nRBC: 0 % (ref 0.0–0.2)

## 2022-07-13 LAB — COMPREHENSIVE METABOLIC PANEL
ALT: 12 U/L (ref 0–44)
AST: 15 U/L (ref 15–41)
Albumin: 3.1 g/dL — ABNORMAL LOW (ref 3.5–5.0)
Alkaline Phosphatase: 93 U/L (ref 38–126)
Anion gap: 9 (ref 5–15)
BUN: 21 mg/dL (ref 8–23)
CO2: 27 mmol/L (ref 22–32)
Calcium: 8.4 mg/dL — ABNORMAL LOW (ref 8.9–10.3)
Chloride: 100 mmol/L (ref 98–111)
Creatinine, Ser: 1.4 mg/dL — ABNORMAL HIGH (ref 0.61–1.24)
GFR, Estimated: 54 mL/min — ABNORMAL LOW (ref >60)
Glucose, Bld: 176 mg/dL — ABNORMAL HIGH (ref 70–99)
Potassium: 4 mmol/L (ref 3.5–5.1)
Sodium: 136 mmol/L (ref 135–145)
Total Bilirubin: 1.2 mg/dL (ref 0.3–1.2)
Total Protein: 7.2 g/dL (ref 6.5–8.1)

## 2022-07-13 LAB — LIPASE, BLOOD: Lipase: 22 U/L (ref 11–51)

## 2022-07-13 MED ORDER — CEFADROXIL 500 MG PO CAPS: 500.0000 mg | ORAL_CAPSULE | Freq: Two times a day (BID) | ORAL | 0 refills | Status: DC

## 2022-07-13 MED ORDER — SODIUM CHLORIDE 0.9 % IV SOLN
1.0000 g | Freq: Once | INTRAVENOUS | Status: AC
  Administered 2022-07-13: 1 g via INTRAVENOUS
  Filled 2022-07-13: qty 10

## 2022-07-13 MED ORDER — OXYCODONE-ACETAMINOPHEN 5-325 MG PO TABS: 1.0000 | ORAL_TABLET | Freq: Once | ORAL | Status: DC

## 2022-07-13 MED ORDER — HYDROCODONE-ACETAMINOPHEN 5-325 MG PO TABS
1.0000 | ORAL_TABLET | Freq: Once | ORAL | Status: AC
  Administered 2022-07-13: 1 via ORAL
  Filled 2022-07-13: qty 1

## 2022-07-13 NOTE — ED Provider Notes (Signed)
[facility] EMERGENCY DEPARTMENT Provider Note   CSN: [id number] Arrival date & time: 07/13/22  1727     History Chief Complaint  Patient presents with   Back Pain    [name] is a 71 y.o. male patient with history of diabetes mellitus, hypertension, chronic atrial fibrillation on Eliquis who presents to the emergency department today for further evaluation of left-sided flank pain and dark urine.  This is been ongoing for 3 days.  Patient states this feels similar to when he has had a urinary tract infection in the past.  He states that last time this happened he went into renal failure.  He denies any urinary urgency, frequency, fever, chills. He does endorse intermittent dysuria.  No abdominal pain, nausea, vomiting, diarrhea.   Back Pain      Home Medications Prior to Admission medications   Medication Sig Start Date End Date Taking? Authorizing Provider  apixaban (ELIQUIS) 5 MG TABS tablet Take 5 mg by mouth 2 (two) times daily.   Yes [provider]  b complex vitamins capsule Take 1 capsule by mouth every morning.   Yes [provider]  cefadroxil (DURICEF) 500 MG capsule Take 1 capsule (500 mg total) by mouth 2 (two) times daily. 07/13/22  Yes Meredeth Ide, Keirah Konitzer M, PA-C  cetirizine (ZYRTEC) 10 MG tablet Take 10 mg by mouth at bedtime.   Yes [provider]  diltiazem (TIAZAC) 120 MG 24 hr capsule Take 1 capsule (120 mg total) by mouth daily. 09/14/21  Yes McGowen, Maryjean Morn, MD  insulin aspart protamine- aspart (NOVOLOG MIX 70/30) (70-30) 100 UNIT/ML injection Inject 0.3 mLs (30 Units total) into the skin 2 (two) times daily with a meal. 03/06/22  Yes Zannie Cove, MD  metolazone (ZAROXOLYN) 5 MG tablet Take 5 mg by mouth daily. In combination with Torsemide   Yes [provider]  Multiple Vitamin (MULTIVITAMIN) tablet Take 1 tablet by mouth daily.   Yes [provider]  nystatin (MYCOSTATIN/NYSTOP) powder Apply 1 application   topically daily. **apply to skin folds*   Yes [provider]  oxyCODONE-acetaminophen (PERCOCET/ROXICET) 5-325 MG tablet Take 1 tablet by mouth once as needed for severe pain.   Yes [provider]  phenazopyridine (PYRIDIUM) 100 MG tablet Take 100 mg by mouth 3 (three) times daily as needed for pain.   Yes [provider]  potassium chloride SA (KLOR-CON M20) 20 MEQ tablet Take one tablet three times per week. Patient taking differently: Take 20 mEq by mouth every Monday, Wednesday, and Friday. Take one tablet three times per week. 04/16/22  Yes Alver Sorrow, NP  senna-docusate (SENOKOT-S) 8.6-50 MG tablet Take 1 tablet by mouth at bedtime. 03/06/22  Yes Zannie Cove, MD  terazosin (HYTRIN) 10 MG capsule Take 10 mg by mouth at bedtime.   Yes [provider]  torsemide (DEMADEX) 20 MG tablet Take 40 mg by mouth once.   Yes [provider]  Torsemide 40 MG TABS Take 40 mg by mouth daily.   Yes [provider]  traMADol (ULTRAM) 50 MG tablet Take 50 mg by mouth daily at 6 (six) AM.   Yes [provider]  vitamin E 400 UNIT capsule Take 400 Units by mouth every morning. Vitamin E with Selenium   Yes [provider]  glucose blood (ONETOUCH ULTRA) test strip CHECK BLOOD SUGAR TWICE  DAILY 11/16/21   McGowen, Maryjean Morn, MD  HYDROcodone-acetaminophen (NORCO/VICODIN) 5-325 MG tablet Take 1-2 tablets by mouth  every 6 (six) hours as needed for moderate pain. 1-2 tabs po bid prn pain Patient taking differently: Take 1 tablet by mouth at bedtime. 1-2 tabs po bid prn pain 03/06/22   Zannie Cove, MD  Insulin Pen Needle (B-D ULTRAFINE III SHORT PEN) 31G X 8 MM MISC USE 1 PENNEEDLE TWICE DAILY 07/18/21   McGowen, Maryjean Morn, MD      Allergies    Hydralazine    Review of Systems   Review of Systems  Musculoskeletal:  Positive for back pain.  All other systems reviewed and are negative.   Physical Exam Updated Vital Signs BP (!)  145/70   Pulse 63   Temp 97.8 F (36.6 C) (Oral)   Resp 20   Ht 5\' 10"  (1.778 m)   Wt (!) 156.9 kg   SpO2 96%   BMI 49.62 kg/m  Physical Exam Vitals and nursing note reviewed.  Constitutional:      General: He is not in acute distress.    Appearance: Normal appearance.  HENT:     Head: Normocephalic and atraumatic.  Eyes:     General:        Right eye: No discharge.        Left eye: No discharge.  Cardiovascular:     Comments: Regular rate and rhythm.  S1/S2 are distinct without any evidence of murmur, rubs, or gallops.  Radial pulses are 2+ bilaterally.  Dorsalis pedis pulses are 2+ bilaterally.  No evidence of pedal edema. Pulmonary:     Comments: Clear to auscultation bilaterally.  Normal effort.  No respiratory distress.  No evidence of wheezes, rales, or rhonchi heard throughout. Abdominal:     General: Abdomen is flat. Bowel sounds are normal. There is no distension.     Tenderness: There is no abdominal tenderness. There is no guarding or rebound.     Comments: No abdominal wall cellulitis.   Musculoskeletal:        General: Normal range of motion.     Cervical back: Neck supple.  Feet:     Comments: Chronic wound to the right foot that is wrapped. Skin:    General: Skin is warm and dry.     Findings: No rash.  Neurological:     General: No focal deficit present.     Mental Status: He is alert.  Psychiatric:        Mood and Affect: Mood normal.        Behavior: Behavior normal.     ED Results / Procedures / Treatments   Labs (all labs ordered are listed, but only abnormal results are displayed) Labs Reviewed  URINALYSIS, ROUTINE W REFLEX MICROSCOPIC - Abnormal; Notable for the following components:      Result Value   Color, Urine AMBER (*)    Hgb urine dipstick SMALL (*)    Nitrite POSITIVE (*)    Leukocytes,Ua LARGE (*)    WBC, UA >50 (*)    Bacteria, UA RARE (*)    All other components within normal limits  CBC WITH DIFFERENTIAL/PLATELET -  Abnormal; Notable for the following components:   Hemoglobin 12.0 (*)    HCT 38.6 (*)    MCV 79.1 (*)    MCH 24.6 (*)    RDW 18.4 (*)    All other components within normal limits  COMPREHENSIVE METABOLIC PANEL - Abnormal; Notable for the following components:   Glucose, Bld 176 (*)    Creatinine, Ser 1.40 (*)    Calcium 8.4 (*)  Albumin 3.1 (*)    GFR, Estimated 54 (*)    All other components within normal limits  LIPASE, BLOOD    EKG None  Radiology CT Renal Stone Study  Result Date: 07/13/2022 CLINICAL DATA:  Left flank pain. EXAM: CT ABDOMEN AND PELVIS WITHOUT CONTRAST TECHNIQUE: Multidetector CT imaging of the abdomen and pelvis was performed following the standard protocol without IV contrast. RADIATION DOSE REDUCTION: This exam was performed according to the departmental dose-optimization program which includes automated exposure control, adjustment of the mA and/or kV according to patient size and/or use of iterative reconstruction technique. COMPARISON:  CT colonoscopy 01/22/2011 FINDINGS: Lower chest: Calcified granulomas are present. There are calcified mediastinal and right hilar lymph nodes. Hepatobiliary: Calcified gallstones are present. No biliary ductal dilatation. No focal liver lesion. Pancreas: Unremarkable. No pancreatic ductal dilatation or surrounding inflammatory changes. Spleen: Mildly enlarged.  Calcified granulomas are present. Adrenals/Urinary Tract: Adrenal glands are unremarkable. Kidneys are normal, without renal calculi, focal lesion, or hydronephrosis. Bladder is unremarkable. Stomach/Bowel: Stomach is within normal limits. No evidence of bowel wall thickening, distention, or inflammatory changes. The appendix is not seen. There is colonic diverticulosis without evidence for acute diverticulitis. Vascular/Lymphatic: Aortic atherosclerosis. Aorta is normal in size. There is a mildly enlarged left retroperitoneal lymph node measuring 10 mm short axis image 2/50.  There is a mildly enlarged left external iliac chain lymph node measuring 13 mm. There is a mildly enlarged right iliac chain lymph node measuring 13 mm. There are enlarged bilateral inguinal lymph nodes measuring up to 14 mm short axis on the right in the 11 mm short axis on the left. Reproductive: Prostate is unremarkable. Penile prosthesis partially visualized. Other: No ascites or focal abdominal wall hernia. There is subcutaneous stranding in the pannus with skin thickening. Musculoskeletal: Degenerative changes affect the spine and hips. IMPRESSION: 1. Mild retroperitoneal, iliac chain and inguinal lymphadenopathy of uncertain etiology. Recommend clinical correlation and follow-up. 2. Subcutaneous stranding and skin thickening of the pannus. Correlate clinically for cellulitis. 3. Cholelithiasis. 4. Mild splenomegaly. 5. Sigmoid colon diverticulosis. 6. Old granulomatous disease. 7.  Aortic Atherosclerosis (ICD10-I70.0). Electronically Signed   By: Darliss Cheney M.D.   On: 07/13/2022 19:04    Procedures Procedures    Medications Ordered in ED Medications  cefTRIAXone (ROCEPHIN) 1 g in sodium chloride 0.9 % 100 mL IVPB (0 g Intravenous Stopped 07/13/22 1950)    ED Course/ Medical Decision Making/ A&P Clinical Course as of 07/13/22 2018  Sat Jul 13, 2022  1912 CBC with Differential(!) No leukocytosis or anemia. [CF]  1912 Comprehensive metabolic panel(!) Elevated creatinine but this seems to be at the patient's baseline.  There is also elevated glucose. [CF]  1912 Lipase, blood Lipase negative. [CF]  1912 Urinalysis, Routine w reflex microscopic Urine, Clean Catch(!) Positive for urinary tract infection. [CF]  1912 CT Renal Stone Study No evidence of kidney stone.  I do agree with the radiologist interpretation.  I personally ordered interpret the study. [CF]  1928 Notify the patient of all labs and imaging.  Antibiotics are still running.  He is doing well.  I looked at the patient's  pannus given the findings on the CT.  I do not see any evidence of abdominal wall cellulitis. [CF]    Clinical Course User Index [CF] Teressa Lower, PA-C                           Medical Decision Making [name]  [name] is a 71 y.o. male patient who presents to the emergency department today for further evaluation of dysuria and left-sided flank pain.  Patient does not have a history of kidney stones but considering his age I will get some labs, urine, and get a scan of his kidneys to further evaluate for possible pyelonephritis.  Patient is in no acute distress at this time.  Patient's symptoms are likely secondary to a urinary tract infection.  I have given him 1 g of ceftriaxone here in the emergency permit.  I will discharge him with Duricef for the next 7 days.  No evidence of AKI or systemic infection today.  He is safe for discharge at this time.  Strict return precautions were discussed.  Amount and/or Complexity of Data Reviewed Labs: ordered. Decision-making details documented in ED Course. Radiology: ordered. Decision-making details documented in ED Course.  Risk Prescription drug management.    Final Clinical Impression(s) / ED Diagnoses Final diagnoses:  Acute cystitis with hematuria    Rx / DC Orders ED Discharge Orders          Ordered    cefadroxil (DURICEF) 500 MG capsule  2 times daily        07/13/22 2014              Jolyn Lent 07/13/22 2018    Bethann Berkshire, MD 07/15/22 1048

## 2022-07-13 NOTE — ED Notes (Signed)
Patient transported to CT 

## 2022-07-13 NOTE — ED Triage Notes (Signed)
Pt to the ED via [facility] with complaints of Left Lower Back pain for the past week with dark urine.  Pt is from the [facility] in [place] with a DNR form.

## 2022-07-13 NOTE — ED Notes (Signed)
[facility] notified of patient needing transportation back to [facility] and [address].

## 2022-07-13 NOTE — Discharge Instructions (Signed)
You are diagnosed with a urinary tract infection today.  As we discussed, your kidney function is normal for you and you do not have any signs of infection in your kidneys.  Please take antibiotics twice daily for the next 7 days.  Please return to the emergency department for any worsening symptoms you might have.

## 2022-07-14 LAB — CBG MONITORING, ED: Glucose-Capillary: 153 mg/dL — ABNORMAL HIGH (ref 70–99)

## 2022-07-14 MED ORDER — DILTIAZEM HCL ER BEADS 120 MG PO CP24
120.0000 mg | ORAL_CAPSULE | Freq: Every day | ORAL | Status: DC
  Filled 2022-07-14 (×2): qty 1

## 2022-07-14 MED ORDER — INSULIN ASPART PROT & ASPART (70-30 MIX) 100 UNIT/ML ~~LOC~~ SUSP
30.0000 [IU] | Freq: Two times a day (BID) | SUBCUTANEOUS | Status: DC
Start: 2022-07-14 — End: 2022-07-14
  Filled 2022-07-14: qty 10

## 2022-07-14 MED ORDER — APIXABAN 5 MG PO TABS: 5.0000 mg | ORAL_TABLET | Freq: Two times a day (BID) | ORAL | Status: DC

## 2022-07-14 MED ORDER — TRAMADOL HCL 50 MG PO TABS
50.0000 mg | ORAL_TABLET | Freq: Every day | ORAL | Status: DC
  Administered 2022-07-14: 50 mg via ORAL
  Filled 2022-07-14: qty 1

## 2022-07-14 MED ORDER — TORSEMIDE 20 MG PO TABS: 40.0000 mg | ORAL_TABLET | Freq: Every day | ORAL | Status: DC

## 2022-07-14 MED ORDER — METOLAZONE 2.5 MG PO TABS: 5.0000 mg | ORAL_TABLET | Freq: Every day | ORAL | Status: DC

## 2022-07-14 MED ORDER — SENNOSIDES-DOCUSATE SODIUM 8.6-50 MG PO TABS: 1.0000 | ORAL_TABLET | Freq: Every day | ORAL | Status: DC

## 2022-07-14 MED ORDER — ACETAMINOPHEN 325 MG PO TABS
650.0000 mg | ORAL_TABLET | Freq: Once | ORAL | Status: AC
  Administered 2022-07-14: 650 mg via ORAL
  Filled 2022-07-14: qty 2

## 2022-07-14 NOTE — ED Notes (Signed)
Suction canister emptied

## 2022-07-15 DIAGNOSIS — N390 Urinary tract infection, site not specified: Secondary | ICD-10-CM | POA: Diagnosis not present

## 2022-07-15 DIAGNOSIS — R300 Dysuria: Secondary | ICD-10-CM | POA: Diagnosis not present

## 2022-07-16 DIAGNOSIS — E113293 Type 2 diabetes mellitus with mild nonproliferative diabetic retinopathy without macular edema, bilateral: Secondary | ICD-10-CM | POA: Diagnosis not present

## 2022-07-16 DIAGNOSIS — N390 Urinary tract infection, site not specified: Secondary | ICD-10-CM | POA: Diagnosis not present

## 2022-07-16 DIAGNOSIS — H2513 Age-related nuclear cataract, bilateral: Secondary | ICD-10-CM | POA: Diagnosis not present

## 2022-07-16 DIAGNOSIS — I509 Heart failure, unspecified: Secondary | ICD-10-CM | POA: Diagnosis not present

## 2022-07-17 DIAGNOSIS — L97419 Non-pressure chronic ulcer of right heel and midfoot with unspecified severity: Secondary | ICD-10-CM | POA: Diagnosis not present

## 2022-07-18 DIAGNOSIS — E1165 Type 2 diabetes mellitus with hyperglycemia: Secondary | ICD-10-CM | POA: Diagnosis not present

## 2022-07-19 DIAGNOSIS — M170 Bilateral primary osteoarthritis of knee: Secondary | ICD-10-CM | POA: Diagnosis not present

## 2022-07-19 DIAGNOSIS — M25562 Pain in left knee: Secondary | ICD-10-CM | POA: Diagnosis not present

## 2022-07-19 DIAGNOSIS — M25561 Pain in right knee: Secondary | ICD-10-CM | POA: Diagnosis not present

## 2022-07-19 DIAGNOSIS — W19XXXA Unspecified fall, initial encounter: Secondary | ICD-10-CM | POA: Diagnosis not present

## 2022-07-22 DIAGNOSIS — M25562 Pain in left knee: Secondary | ICD-10-CM | POA: Diagnosis not present

## 2022-07-22 DIAGNOSIS — M6281 Muscle weakness (generalized): Secondary | ICD-10-CM | POA: Diagnosis not present

## 2022-07-22 DIAGNOSIS — W19XXXA Unspecified fall, initial encounter: Secondary | ICD-10-CM | POA: Diagnosis not present

## 2022-07-22 DIAGNOSIS — R2681 Unsteadiness on feet: Secondary | ICD-10-CM | POA: Diagnosis not present

## 2022-07-22 DIAGNOSIS — M25561 Pain in right knee: Secondary | ICD-10-CM | POA: Diagnosis not present

## 2022-07-23 DIAGNOSIS — R2681 Unsteadiness on feet: Secondary | ICD-10-CM | POA: Diagnosis not present

## 2022-07-23 DIAGNOSIS — M6281 Muscle weakness (generalized): Secondary | ICD-10-CM | POA: Diagnosis not present

## 2022-07-24 DIAGNOSIS — L97419 Non-pressure chronic ulcer of right heel and midfoot with unspecified severity: Secondary | ICD-10-CM | POA: Diagnosis not present

## 2022-07-24 DIAGNOSIS — M6281 Muscle weakness (generalized): Secondary | ICD-10-CM | POA: Diagnosis not present

## 2022-07-24 DIAGNOSIS — R2681 Unsteadiness on feet: Secondary | ICD-10-CM | POA: Diagnosis not present

## 2022-07-25 DIAGNOSIS — R2681 Unsteadiness on feet: Secondary | ICD-10-CM | POA: Diagnosis not present

## 2022-07-25 DIAGNOSIS — M6281 Muscle weakness (generalized): Secondary | ICD-10-CM | POA: Diagnosis not present

## 2022-07-26 DIAGNOSIS — L900 Lichen sclerosus et atrophicus: Secondary | ICD-10-CM | POA: Diagnosis not present

## 2022-07-26 DIAGNOSIS — L97419 Non-pressure chronic ulcer of right heel and midfoot with unspecified severity: Secondary | ICD-10-CM | POA: Diagnosis not present

## 2022-07-26 DIAGNOSIS — M6281 Muscle weakness (generalized): Secondary | ICD-10-CM | POA: Diagnosis not present

## 2022-07-26 DIAGNOSIS — R2681 Unsteadiness on feet: Secondary | ICD-10-CM | POA: Diagnosis not present

## 2022-07-26 DIAGNOSIS — I890 Lymphedema, not elsewhere classified: Secondary | ICD-10-CM | POA: Diagnosis not present

## 2022-07-29 DIAGNOSIS — Z79899 Other long term (current) drug therapy: Secondary | ICD-10-CM | POA: Diagnosis not present

## 2022-07-29 DIAGNOSIS — E119 Type 2 diabetes mellitus without complications: Secondary | ICD-10-CM | POA: Diagnosis not present

## 2022-07-29 DIAGNOSIS — Z794 Long term (current) use of insulin: Secondary | ICD-10-CM | POA: Diagnosis not present

## 2022-07-29 DIAGNOSIS — R2681 Unsteadiness on feet: Secondary | ICD-10-CM | POA: Diagnosis not present

## 2022-07-29 DIAGNOSIS — M6281 Muscle weakness (generalized): Secondary | ICD-10-CM | POA: Diagnosis not present

## 2022-07-30 DIAGNOSIS — R2681 Unsteadiness on feet: Secondary | ICD-10-CM | POA: Diagnosis not present

## 2022-07-30 DIAGNOSIS — M6281 Muscle weakness (generalized): Secondary | ICD-10-CM | POA: Diagnosis not present

## 2022-07-31 ENCOUNTER — Telehealth: Payer: Self-pay

## 2022-07-31 DIAGNOSIS — L89892 Pressure ulcer of other site, stage 2: Secondary | ICD-10-CM | POA: Diagnosis not present

## 2022-07-31 DIAGNOSIS — R2681 Unsteadiness on feet: Secondary | ICD-10-CM | POA: Diagnosis not present

## 2022-07-31 DIAGNOSIS — M6281 Muscle weakness (generalized): Secondary | ICD-10-CM | POA: Diagnosis not present

## 2022-07-31 NOTE — Telephone Encounter (Signed)
Call to followup with patient, in long term care now and applying for medicaid.  Has decubitus on buttocks, knee issues, distressed over care and noise in faciility.  To have a "knee ablation" soon and hopes to be able to walk without pain.  Offered encouragement, prayers, and will keep doing followup to offer any suggestions.  Vinnie Langton, RN, [place], [phone]

## 2022-08-01 DIAGNOSIS — R2681 Unsteadiness on feet: Secondary | ICD-10-CM | POA: Diagnosis not present

## 2022-08-01 DIAGNOSIS — M6281 Muscle weakness (generalized): Secondary | ICD-10-CM | POA: Diagnosis not present

## 2022-08-03 DIAGNOSIS — R2681 Unsteadiness on feet: Secondary | ICD-10-CM | POA: Diagnosis not present

## 2022-08-03 DIAGNOSIS — M6281 Muscle weakness (generalized): Secondary | ICD-10-CM | POA: Diagnosis not present

## 2022-08-05 ENCOUNTER — Other Ambulatory Visit (INDEPENDENT_AMBULATORY_CARE_PROVIDER_SITE_OTHER): Payer: Self-pay | Admitting: Nurse Practitioner

## 2022-08-05 DIAGNOSIS — M6281 Muscle weakness (generalized): Secondary | ICD-10-CM | POA: Diagnosis not present

## 2022-08-05 DIAGNOSIS — I890 Lymphedema, not elsewhere classified: Secondary | ICD-10-CM

## 2022-08-05 DIAGNOSIS — I872 Venous insufficiency (chronic) (peripheral): Secondary | ICD-10-CM

## 2022-08-05 DIAGNOSIS — R2681 Unsteadiness on feet: Secondary | ICD-10-CM | POA: Diagnosis not present

## 2022-08-05 NOTE — [facility]reg[facility] Nurse Program (Addendum)
Sent client note of encouragement.  Vinnie Langton, [facility]  [phone]

## 2022-08-06 DIAGNOSIS — R2681 Unsteadiness on feet: Secondary | ICD-10-CM | POA: Diagnosis not present

## 2022-08-06 DIAGNOSIS — B351 Tinea unguium: Secondary | ICD-10-CM | POA: Diagnosis not present

## 2022-08-06 DIAGNOSIS — M6281 Muscle weakness (generalized): Secondary | ICD-10-CM | POA: Diagnosis not present

## 2022-08-06 DIAGNOSIS — E1159 Type 2 diabetes mellitus with other circulatory complications: Secondary | ICD-10-CM | POA: Diagnosis not present

## 2022-08-07 ENCOUNTER — Telehealth (HOSPITAL_COMMUNITY): Payer: Self-pay

## 2022-08-07 ENCOUNTER — Encounter (INDEPENDENT_AMBULATORY_CARE_PROVIDER_SITE_OTHER): Payer: Self-pay | Admitting: Nurse Practitioner

## 2022-08-07 ENCOUNTER — Ambulatory Visit (INDEPENDENT_AMBULATORY_CARE_PROVIDER_SITE_OTHER): Payer: Medicare Other

## 2022-08-07 ENCOUNTER — Ambulatory Visit (INDEPENDENT_AMBULATORY_CARE_PROVIDER_SITE_OTHER): Payer: Medicare Other | Admitting: Nurse Practitioner

## 2022-08-07 DIAGNOSIS — I872 Venous insufficiency (chronic) (peripheral): Secondary | ICD-10-CM

## 2022-08-07 DIAGNOSIS — I890 Lymphedema, not elsewhere classified: Secondary | ICD-10-CM

## 2022-08-07 NOTE — Telephone Encounter (Signed)
Spoke to [place] regarding recent referral for genicular nerve block. Per Dr. Pascal Lux, this is not a procedure that IR does. He recommends a pain doctor in town. Terri thanked me for checking on this and will let the patient know. AW

## 2022-08-08 DIAGNOSIS — M6281 Muscle weakness (generalized): Secondary | ICD-10-CM | POA: Diagnosis not present

## 2022-08-08 DIAGNOSIS — R2681 Unsteadiness on feet: Secondary | ICD-10-CM | POA: Diagnosis not present

## 2022-08-09 DIAGNOSIS — G8929 Other chronic pain: Secondary | ICD-10-CM | POA: Diagnosis not present

## 2022-08-09 DIAGNOSIS — Z760 Encounter for issue of repeat prescription: Secondary | ICD-10-CM | POA: Diagnosis not present

## 2022-08-09 DIAGNOSIS — M6281 Muscle weakness (generalized): Secondary | ICD-10-CM | POA: Diagnosis not present

## 2022-08-09 DIAGNOSIS — R2681 Unsteadiness on feet: Secondary | ICD-10-CM | POA: Diagnosis not present

## 2022-08-11 ENCOUNTER — Encounter (INDEPENDENT_AMBULATORY_CARE_PROVIDER_SITE_OTHER): Payer: Self-pay | Admitting: Nurse Practitioner

## 2022-08-11 DIAGNOSIS — R2681 Unsteadiness on feet: Secondary | ICD-10-CM | POA: Diagnosis not present

## 2022-08-11 DIAGNOSIS — M6281 Muscle weakness (generalized): Secondary | ICD-10-CM | POA: Diagnosis not present

## 2022-08-11 NOTE — Progress Notes (Signed)
Subjective:    Patient ID: [name], male    DOB: Jun 22, 1951, 71 y.o.   MRN: [id number] Chief Complaint  Patient presents with   New Patient (Initial Visit)    Ref Clemmie Krill consult CVI,lymphedema,chronic ulcers    Su Amano is a 71 year old male who presents today for evaluation of his bilateral lower extremity edema in addition to ulcerations.  He notes that he is had these ulcerations that have occurred off and on for years.  He notes that they will help for some time and then recur.  She does have a longstanding history of lymphedema.  Prior to living in his facility he had a lymphedema pump many years ago.  He does utilize compression placed on him by the facility daily in addition to elevating his legs when possible.  Currently physical activity is difficult for him but with physical therapy he is hopeful that he will be able to become somewhat more active that he is currently.  Today noninvasive study are limited and unable to evaluate for DVT but no evidence of superficial thrombophlebitis was noted.  No evidence of deep venous insufficiency bilaterally.  No evidence of superficial venous reflux bilaterally however study was limited due to body habitus.  There were noted triphasic tibial artery waveforms in the bilateral lower extremities.    Review of Systems  Cardiovascular:  Positive for leg swelling.  Skin:  Positive for wound.  All other systems reviewed and are negative.      Objective:   Physical Exam Vitals reviewed.  Constitutional:      Appearance: He is obese.  HENT:     Head: Normocephalic.  Cardiovascular:     Rate and Rhythm: Normal rate.  Pulmonary:     Effort: Pulmonary effort is normal.  Musculoskeletal:     Right lower leg: Edema present.     Left lower leg: Edema present.  Skin:    General: Skin is warm and dry.     Comments: Dermal thickening with small associated venous ulcers bilaterally  Neurological:     Mental Status: He is alert and  oriented to person, place, and time.  Psychiatric:        Mood and Affect: Mood normal.        Behavior: Behavior normal.        Thought Content: Thought content normal.        Judgment: Judgment normal.     BP (!) 160/64 (BP Location: Right Arm)   Pulse 65   Resp 16   Past Medical History:  Diagnosis Date   ALLERGIC RHINITIS 08/11/2006   ASTHMA 08/11/2006   Bradycardia    Beta blocker d/c'd 2022   Chronic atrial fibrillation ([place]) 08/2014   Chronic combined systolic and diastolic CHF (congestive heart failure) [facility])    Cardiology f/u 12/2017: pt volume overloaded (R heart dysf suspected), BNP very high, lasix increased.  Repeat echo 12/2017: normal LV EF, mild DD, +RV syst dysfxn, mod pulm HTN, biatrial enlgmt.   Chronic constipation    Chronic renal insufficiency, stage 3 (moderate) (HCC)    Colon cancer screening 02/2021   02/2021 Cologuard POS->GI ref   DIABETES MELLITUS, TYPE II 08/11/2006   HYPERTENSION 08/11/2006   IDA (iron deficiency anemia)    04/2022.  Iron started.  Needs hemoccults   Impaired mobility and endurance    MRSA infection 06/2018   LL venous stasis ulcer infected   Normocytic anemia 2016-2019   03/2018 B12 normal, iron ok (  ferritin borderline low).   OBESITY, MORBID 12/14/2007   saxenda started 05/2020 by [facility] wt mgmt center   OSA (obstructive sleep apnea)    to get sleep study with [facility]) as of 12/03/2018 consult.   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  92018 ID started him on amoxil prophylaxis---ineffective.  End 2018/Jan 2019 penicillin G injections prophyl helpful but pt declined to continue this as of 01/2018 ID f/u.  ID talked him into resuming monthly penicillin G as of 02/2018 f/u.     Restless leg syndrome    Rx'd clonazepam 09/2017 and pt refused to take it after reading the medication's potential side effects.   Venous stasis ulcers of both lower  extremities (HCC)    Severe lymphedema.  wound clinic care ongoing as of 01/2018    Social History   Socioeconomic History   Marital status: Single    Spouse name: Not on file   Number of children: 0   Years of education: 14   Highest education level: Associate degree: occupational, Hotel manager, or vocational program  Occupational History   Occupation: retired  Tobacco Use   Smoking status: Never    Passive exposure: Never   Smokeless tobacco: Never  Vaping Use   Vaping Use: Never used  Substance and Sexual Activity   Alcohol use: No   Drug use: No   Sexual activity: Not Currently  Other Topics Concern   Not on file  Social History Narrative   Single, no children.   Lives in [place] since 1985 [place] from [place]).   Occupation: Counsellor for [employer].   No Tobacco, no alc, drugs.   Exercise: no    Diet: no      Social Determinants of Health   Financial Resource Strain: Medium Risk (02/13/2022)   Overall Financial Resource Strain (CARDIA)    Difficulty of Paying Living Expenses: Somewhat hard  Food Insecurity: Food Insecurity Present (07/31/2022)   Hunger Vital Sign    Worried About Running Out of Food in the Last Year: Sometimes true    Ran Out of Food in the Last Year: Sometimes true  Transportation Needs: No Transportation Needs (02/13/2022)   PRAPARE - Hydrologist (Medical): No    Lack of Transportation (Non-Medical): No  Physical Activity: Inactive (02/13/2022)   Exercise Vital Sign    Days of Exercise per Week: 0 days    Minutes of Exercise per Session: 0 min  Stress: Stress Concern Present (02/13/2022)   [place]    Feeling of Stress : Very much  Social Connections: Moderately Integrated (02/13/2022)   Social Connection and Isolation Panel [NHANES]    Frequency of Communication with Friends and Family: Three times a week    Frequency of Social  Gatherings with Friends and Family: Never    Attends Religious Services: 1 to 4 times per year    Active Member of [employer] or Organizations: Yes    Attends Archivist Meetings: 1 to 4 times per year    Marital Status: Never married  Intimate Partner Violence: Not At Risk (02/13/2022)   Humiliation, Afraid, Rape, and Kick questionnaire    Fear of Current or Ex-Partner: No    Emotionally Abused: No    Physically Abused: No    Sexually Abused: No    Past Surgical History:  Procedure Laterality Date   Carotid dopplers  07/23/2018   Left NORMAL.  Right 1-39% ICA stenosis, with <50% distal CCA stenosis (not hemodynamically significant)   LE venous dopplers Bilateral 03/06/2022   no dvt   RIGHT HEART CATH N/A 02/19/2022   Marked volume overload, R>>L HF, preserved CO. Procedure: RIGHT HEART CATH;  Surgeon: Jolaine Artist, MD;  Location: [place] [facility];  Service: Cardiovascular;  Laterality: N/A;   TRANSTHORACIC ECHOCARDIOGRAM  11/2007; 09/2014; 11/2015;12/2017   LV fxn normal, EF normal, mild dilation of left atrium.  2015 grade II diast dysfxn.  2017 EF 55-60%. 2019: LVEF 60-65%, mild RV syst dysf,biatrial enlgmt, mod pulm htn. 02/14/22 EF 55-60%, unable to assess diast fxn, LVH, o/w normal   URETERAL STENT PLACEMENT     virtual colonoscopy  01/2011   Normal    Family History  Problem Relation Age of Onset   Diabetes Mother    Diabetes Sister    Hydrocephalus Sister        NPH    Allergies  Allergen Reactions   Hydralazine Other (See Comments)    Drowsiness/sedation/mental fog       Latest Ref Rng & Units 07/13/2022    6:35 PM 04/23/2022    4:07 PM 04/11/2022    1:48 PM  CBC  WBC 4.0 - 10.5 K/uL 8.2  10.3  10.1   Hemoglobin 13.0 - 17.0 g/dL 12.0  13.4  11.5   Hematocrit 39.0 - 52.0 % 38.6  41.7  36.9   Platelets 150 - 400 K/uL 245  303  521.0       CMP     Component Value Date/Time   NA 136 07/13/2022 1835   NA 141 02/09/2020 1453   K 4.0 07/13/2022 1835    CL 100 07/13/2022 1835   CO2 27 07/13/2022 1835   GLUCOSE 176 (H) 07/13/2022 1835   BUN 21 07/13/2022 1835   BUN 20 02/09/2020 1453   CREATININE 1.40 (H) 07/13/2022 1835   CREATININE 1.57 (H) 04/04/2021 0844   CALCIUM 8.4 (L) 07/13/2022 1835   PROT 7.2 07/13/2022 1835   ALBUMIN 3.1 (L) 07/13/2022 1835   AST 15 07/13/2022 1835   ALT 12 07/13/2022 1835   ALKPHOS 93 07/13/2022 1835   BILITOT 1.2 07/13/2022 1835   GFRNONAA 54 (L) 07/13/2022 1835   GFRAA 63 02/09/2020 1453     No results found.     Assessment & Plan:   1. Chronic venous insufficiency Patient's lower extremity blisters are likely result of venous insufficiency.  Wound care facility should continue with [place] wraps.  Unna boots may be helpful.  If wound continues to refer to wound center for further evaluation.  Good control of lymphedema will also be important.  We will have the patient return in 6 months. - VAS [place] LOWER EXTREMITY VENOUS REFLUX  2. Lymphedema Recommend:  No surgery or intervention at this point in time.    I have reviewed my discussion with the patient regarding lymphedema and why it  causes symptoms.  Patient will continue wearing graduated compression on a daily basis. The patient should put the compression on first thing in the morning and removing them in the evening. The patient should not sleep in the compression.   In addition, behavioral modification throughout the day will be continued.  This will include frequent elevation (such as in a recliner), use of over the counter pain medications as needed and exercise such as walking.  The systemic causes for chronic edema such as liver, kidney  and cardiac etiologies do not appear to have significant changed over the past year.    Despite conservative treatments of at least 4 weeks, including graduated compression therapy class 1 and behavioral modification including exercise and elevation the patient  has not obtained adequate control of the  lymphedema.  The patient still has stage 3 lymphedema and therefore, I believe that a lymph pump should be added to improve the control of the patient's lymphedema.  Additionally, a lymph pump is warranted because it will reduce the risk of cellulitis and ulceration in the future.  Patient should follow-up in six months   - VAS [place] LOWER EXTREMITY VENOUS REFLUX   Current Outpatient Medications on File Prior to Visit  Medication Sig Dispense Refill   aluminum sulfate-calcium acetate (DOMEBORO) packet Apply 1 packet topically 3 (three) times daily.     apixaban (ELIQUIS) 5 MG TABS tablet Take 5 mg by mouth 2 (two) times daily.     b complex vitamins capsule Take 1 capsule by mouth every morning.     cetirizine (ZYRTEC) 10 MG tablet Take 10 mg by mouth at bedtime.     diclofenac Sodium (VOLTAREN) 1 % GEL Apply topically in the morning, at noon, and at bedtime.     diltiazem (TIAZAC) 120 MG 24 hr capsule Take 1 capsule (120 mg total) by mouth daily. 90 capsule 3   glucose blood (ONETOUCH ULTRA) test strip CHECK BLOOD SUGAR TWICE  DAILY 200 strip 0   HYDROcodone-acetaminophen (NORCO/VICODIN) 5-325 MG tablet Take 1-2 tablets by mouth every 6 (six) hours as needed for moderate pain. 1-2 tabs po bid prn pain (Patient taking differently: Take 1 tablet by mouth at bedtime. 1-2 tabs po bid prn pain) 10 tablet 0   Insulin Lispro Prot & Lispro (HUMALOG 75/25 MIX) (75-25) 100 UNIT/ML Kwikpen Inject 30 Units into the skin 2 (two) times daily.     lidocaine (LIDODERM) 5 % Place 1 patch onto the skin daily. Remove & Discard patch within 12 hours or as directed by MD     metolazone (ZAROXOLYN) 5 MG tablet Take 5 mg by mouth daily. In combination with Torsemide     Multiple Vitamin (MULTIVITAMIN) tablet Take 1 tablet by mouth daily.     potassium chloride SA (KLOR-CON M20) 20 MEQ tablet Take one tablet three times per week. (Patient taking differently: Take 20 mEq by mouth every Monday, Wednesday, and Friday. Take  one tablet three times per week.) 45 tablet 2   senna-docusate (SENOKOT-S) 8.6-50 MG tablet Take 1 tablet by mouth at bedtime.     terazosin (HYTRIN) 10 MG capsule Take 10 mg by mouth at bedtime.     Torsemide 40 MG TABS Take 40 mg by mouth daily.     traMADol (ULTRAM) 50 MG tablet Take 50 mg by mouth daily at 6 (six) AM.     vitamin E 400 UNIT capsule Take 400 Units by mouth every morning. Vitamin E with Selenium     cefadroxil (DURICEF) 500 MG capsule Take 1 capsule (500 mg total) by mouth 2 (two) times daily. (Patient not taking: Reported on 08/07/2022) 14 capsule 0   insulin aspart protamine- aspart (NOVOLOG MIX 70/30) (70-30) 100 UNIT/ML injection Inject 0.3 mLs (30 Units total) into the skin 2 (two) times daily with a meal. (Patient not taking: Reported on 08/07/2022) 10 mL 0   Insulin Pen Needle (B-D ULTRAFINE III SHORT PEN) 31G X 8 MM MISC USE 1 PENNEEDLE TWICE DAILY (Patient not taking:  Reported on 08/07/2022) 200 each 1   nystatin (MYCOSTATIN/NYSTOP) powder Apply 1 application  topically daily. **apply to skin folds* (Patient not taking: Reported on 08/07/2022)     oxyCODONE-acetaminophen (PERCOCET/ROXICET) 5-325 MG tablet Take 1 tablet by mouth once as needed for severe pain. (Patient not taking: Reported on 08/07/2022)     phenazopyridine (PYRIDIUM) 100 MG tablet Take 100 mg by mouth 3 (three) times daily as needed for pain. (Patient not taking: Reported on 08/07/2022)     torsemide (DEMADEX) 20 MG tablet Take 40 mg by mouth once. (Patient not taking: Reported on 08/07/2022)     Current Facility-Administered Medications on File Prior to Visit  Medication Dose Route Frequency Provider Last Rate Last Admin   bupivacaine (MARCAINE) 0.5 % (with pres) injection 3 mL  3 mL Other Once Magnus Sinning, MD        There are no Patient Instructions on file for this visit. No follow-ups on file.   Kris Hartmann, NP

## 2022-08-12 DIAGNOSIS — M6281 Muscle weakness (generalized): Secondary | ICD-10-CM | POA: Diagnosis not present

## 2022-08-12 DIAGNOSIS — E119 Type 2 diabetes mellitus without complications: Secondary | ICD-10-CM | POA: Diagnosis not present

## 2022-08-12 DIAGNOSIS — R2681 Unsteadiness on feet: Secondary | ICD-10-CM | POA: Diagnosis not present

## 2022-08-12 NOTE — Progress Notes (Signed)
[facility] Quality Team Note  Name: Landry Kamath Menzer Date of Birth: [birth year] MRN: [id number] Date: 08/12/2022  [facility] Quality Team has reviewed this patient's chart, please see recommendations below:  [facility] Quality Other; (KED: Kidney Health Evaluation Gap- Patient needs Urine Albumin Creatinine Ratio Test completed for gap closure. EGFR has already been completed, Patient has upcoming appointment with [facility] 10/10/2022.)

## 2022-08-13 ENCOUNTER — Telehealth: Payer: Self-pay

## 2022-08-13 ENCOUNTER — Ambulatory Visit (INDEPENDENT_AMBULATORY_CARE_PROVIDER_SITE_OTHER): Payer: Medicare Other | Admitting: Orthopaedic Surgery

## 2022-08-13 DIAGNOSIS — G8929 Other chronic pain: Secondary | ICD-10-CM

## 2022-08-13 DIAGNOSIS — M25561 Pain in right knee: Secondary | ICD-10-CM

## 2022-08-13 DIAGNOSIS — M25562 Pain in left knee: Secondary | ICD-10-CM

## 2022-08-13 NOTE — Progress Notes (Signed)
Office Visit Note   Patient: [name]           Date of Birth: 1951-01-18           MRN: [id number] Visit Date: 08/13/2022              Requested by: Tammi Sou, MD [address] [place],  [place] [address] PCP: Tammi Sou, MD   Assessment & Plan: Visit Diagnoses:  1. Chronic pain of both knees     Plan: Patient comes back today for chronic right knee pain.  Genicular nerve block was unable to be performed by Dr. Ernestina Patches or Dr. Herma Mering.  We will submit a request to be done at [facility] if possible.  At this point I have explained to Mr. Erekson her that I have no other treatment options for his DJD.  Follow-up as needed.  Follow-Up Instructions: No follow-ups on file.   Orders:  No orders of the defined types were placed in this encounter.  No orders of the defined types were placed in this encounter.     Procedures: No procedures performed   Clinical Data: No additional findings.   Subjective: Chief Complaint  Patient presents with   Left Knee - Pain    HPI  Review of Systems   Objective: Vital Signs: There were no vitals taken for this visit.  Physical Exam  Ortho Exam  Specialty Comments:  No specialty comments available.  Imaging: No results found.   PMFS History: Patient Active Problem List   Diagnosis Date Noted   Primary osteoarthritis of right knee 06/04/2022   Primary osteoarthritis of left knee 06/04/2022   Body mass index 45.0-49.9, adult ([place]) 06/04/2022   AKI (acute kidney injury) ([place]) 04/25/2022   Acute renal failure superimposed on stage 3b chronic kidney disease ([place]) 04/23/2022   Hyponatremia 04/23/2022   Pressure injury of skin 03/07/2022   Acute kidney injury superimposed on chronic kidney disease ([place])    Obesity, Class III, BMI 40-49.9 (morbid obesity) ([place])    Acute on chronic diastolic CHF (congestive heart failure) ([place]) 02/13/2022   Uncontrolled type 2 diabetes mellitus with  hyperglycemia ([place]) 03/04/2018   Chronic diastolic CHF (congestive heart failure) ([place]) 12/24/2016   Lymphedema 02/22/2016   Diabetes with neurologic complications ([place]) 19/14/7829   Chronic headaches 05/10/2015   Equivocal stress test 10/24/2014   Persistent atrial fibrillation ([place]) 08/15/2014   Healthcare maintenance 08/15/2014   Type 2 diabetes mellitus with diabetic neuropathy ([place]) 08/15/2014   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   Recurrent cellulitis of lower extremity 08/02/2013   Lymphedema of both lower extremities 11/04/2008   Morbid obesity due to excess calories ([place]) 12/14/2007   Venous (peripheral) insufficiency 07/22/2007   Uncontrolled type 2 diabetes mellitus with hyperglycemia, with long-term current use of insulin ([place]) 08/11/2006   Hypertension, essential 08/11/2006   ALLERGIC RHINITIS 08/11/2006   ASTHMA 08/11/2006   Asthma 08/11/2006   Past Medical History:  Diagnosis Date   ALLERGIC RHINITIS 08/11/2006   ASTHMA 08/11/2006   Bradycardia    Beta blocker d/c'd 2022   Chronic atrial fibrillation ([place]) 08/2014   Chronic combined systolic and diastolic CHF (congestive heart failure) [facility])    Cardiology f/u 12/2017: pt volume overloaded (R heart dysf suspected), BNP very high, lasix increased.  Repeat echo 12/2017: normal LV EF, mild DD, +RV syst dysfxn, mod pulm HTN, biatrial enlgmt.   Chronic constipation    Chronic renal insufficiency, stage 3 (moderate) (HCC)  Colon cancer screening 02/2021   02/2021 Cologuard POS->GI ref   DIABETES MELLITUS, TYPE II 08/11/2006   HYPERTENSION 08/11/2006   IDA (iron deficiency anemia)    04/2022.  Iron started.  Needs hemoccults   Impaired mobility and endurance    MRSA infection 06/2018   LL venous stasis ulcer infected   Normocytic anemia 2016-2019   03/2018 B12 normal, iron ok (ferritin borderline low).   OBESITY, MORBID 12/14/2007   saxenda started 05/2020 by [facility] wt mgmt center   OSA (obstructive sleep apnea)    to get  sleep study with [facility]) as of 12/03/2018 consult.   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  92018 ID started him on amoxil prophylaxis---ineffective.  End 2018/Jan 2019 penicillin G injections prophyl helpful but pt declined to continue this as of 01/2018 ID f/u.  ID talked him into resuming monthly penicillin G as of 02/2018 f/u.     Restless leg syndrome    Rx'd clonazepam 09/2017 and pt refused to take it after reading the medication's potential side effects.   Venous stasis ulcers of both lower extremities (HCC)    Severe lymphedema.  wound clinic care ongoing as of 01/2018    Family History  Problem Relation Age of Onset   Diabetes Mother    Diabetes Sister    Hydrocephalus Sister        NPH    Past Surgical History:  Procedure Laterality Date   Carotid dopplers  07/23/2018   Left NORMAL.  Right 1-39% ICA stenosis, with <50% distal CCA stenosis (not hemodynamically significant)   LE venous dopplers Bilateral 03/06/2022   no dvt   RIGHT HEART CATH N/A 02/19/2022   Marked volume overload, R>>L HF, preserved CO. Procedure: RIGHT HEART CATH;  Surgeon: Jolaine Artist, MD;  Location: [place] [facility];  Service: Cardiovascular;  Laterality: N/A;   TRANSTHORACIC ECHOCARDIOGRAM  11/2007; 09/2014; 11/2015;12/2017   LV fxn normal, EF normal, mild dilation of left atrium.  2015 grade II diast dysfxn.  2017 EF 55-60%. 2019: LVEF 60-65%, mild RV syst dysf,biatrial enlgmt, mod pulm htn. 02/14/22 EF 55-60%, unable to assess diast fxn, LVH, o/w normal   URETERAL STENT PLACEMENT     virtual colonoscopy  01/2011   Normal   Social History   Occupational History   Occupation: retired  Tobacco Use   Smoking status: Never    Passive exposure: Never   Smokeless tobacco: Never  Vaping Use   Vaping Use: Never used  Substance and Sexual Activity   Alcohol use: No   Drug use: No   Sexual activity: Not  Currently

## 2022-08-13 NOTE — Telephone Encounter (Signed)
Can you help me figure out how to order this from [place] We sent him [facility] for a Left genicular nerve block in the knee He couldn't stand up to get on the table so he needs a lower table But I don't know how to send an order over there

## 2022-08-14 DIAGNOSIS — L89892 Pressure ulcer of other site, stage 2: Secondary | ICD-10-CM | POA: Diagnosis not present

## 2022-08-14 DIAGNOSIS — W19XXXA Unspecified fall, initial encounter: Secondary | ICD-10-CM | POA: Diagnosis not present

## 2022-08-14 DIAGNOSIS — E119 Type 2 diabetes mellitus without complications: Secondary | ICD-10-CM | POA: Diagnosis not present

## 2022-08-14 DIAGNOSIS — R2681 Unsteadiness on feet: Secondary | ICD-10-CM | POA: Diagnosis not present

## 2022-08-14 DIAGNOSIS — M6281 Muscle weakness (generalized): Secondary | ICD-10-CM | POA: Diagnosis not present

## 2022-08-14 DIAGNOSIS — I5030 Unspecified diastolic (congestive) heart failure: Secondary | ICD-10-CM | POA: Diagnosis not present

## 2022-08-14 DIAGNOSIS — Z87440 Personal history of urinary (tract) infections: Secondary | ICD-10-CM | POA: Diagnosis not present

## 2022-08-14 DIAGNOSIS — I4891 Unspecified atrial fibrillation: Secondary | ICD-10-CM | POA: Diagnosis not present

## 2022-08-14 DIAGNOSIS — Z794 Long term (current) use of insulin: Secondary | ICD-10-CM | POA: Diagnosis not present

## 2022-08-14 DIAGNOSIS — G2581 Restless legs syndrome: Secondary | ICD-10-CM | POA: Diagnosis not present

## 2022-08-15 DIAGNOSIS — M6281 Muscle weakness (generalized): Secondary | ICD-10-CM | POA: Diagnosis not present

## 2022-08-15 DIAGNOSIS — R2681 Unsteadiness on feet: Secondary | ICD-10-CM | POA: Diagnosis not present

## 2022-08-16 DIAGNOSIS — R2681 Unsteadiness on feet: Secondary | ICD-10-CM | POA: Diagnosis not present

## 2022-08-16 DIAGNOSIS — Z760 Encounter for issue of repeat prescription: Secondary | ICD-10-CM | POA: Diagnosis not present

## 2022-08-16 DIAGNOSIS — G8929 Other chronic pain: Secondary | ICD-10-CM | POA: Diagnosis not present

## 2022-08-16 DIAGNOSIS — M6281 Muscle weakness (generalized): Secondary | ICD-10-CM | POA: Diagnosis not present

## 2022-08-19 DIAGNOSIS — R2681 Unsteadiness on feet: Secondary | ICD-10-CM | POA: Diagnosis not present

## 2022-08-19 DIAGNOSIS — M6281 Muscle weakness (generalized): Secondary | ICD-10-CM | POA: Diagnosis not present

## 2022-08-20 DIAGNOSIS — R2681 Unsteadiness on feet: Secondary | ICD-10-CM | POA: Diagnosis not present

## 2022-08-20 DIAGNOSIS — M6281 Muscle weakness (generalized): Secondary | ICD-10-CM | POA: Diagnosis not present

## 2022-08-21 DIAGNOSIS — R2681 Unsteadiness on feet: Secondary | ICD-10-CM | POA: Diagnosis not present

## 2022-08-21 DIAGNOSIS — L89892 Pressure ulcer of other site, stage 2: Secondary | ICD-10-CM | POA: Diagnosis not present

## 2022-08-21 DIAGNOSIS — D631 Anemia in chronic kidney disease: Secondary | ICD-10-CM | POA: Diagnosis not present

## 2022-08-21 DIAGNOSIS — M6281 Muscle weakness (generalized): Secondary | ICD-10-CM | POA: Diagnosis not present

## 2022-08-21 DIAGNOSIS — E1122 Type 2 diabetes mellitus with diabetic chronic kidney disease: Secondary | ICD-10-CM | POA: Diagnosis not present

## 2022-08-22 DIAGNOSIS — M6281 Muscle weakness (generalized): Secondary | ICD-10-CM | POA: Diagnosis not present

## 2022-08-22 DIAGNOSIS — L900 Lichen sclerosus et atrophicus: Secondary | ICD-10-CM | POA: Diagnosis not present

## 2022-08-22 DIAGNOSIS — R2681 Unsteadiness on feet: Secondary | ICD-10-CM | POA: Diagnosis not present

## 2022-08-23 DIAGNOSIS — R2681 Unsteadiness on feet: Secondary | ICD-10-CM | POA: Diagnosis not present

## 2022-08-23 DIAGNOSIS — M6281 Muscle weakness (generalized): Secondary | ICD-10-CM | POA: Diagnosis not present

## 2022-08-27 DIAGNOSIS — R2681 Unsteadiness on feet: Secondary | ICD-10-CM | POA: Diagnosis not present

## 2022-08-27 DIAGNOSIS — M6281 Muscle weakness (generalized): Secondary | ICD-10-CM | POA: Diagnosis not present

## 2022-08-27 NOTE — Telephone Encounter (Signed)
Pt called and is asking for an update on this please.

## 2022-08-27 NOTE — Telephone Encounter (Signed)
Per Terri's note:   From: Marlyne Beards, CMA Sent: 08/07/2022   1:41 PM EDT To: Lendon Collar, RT Subject: genicular nerve block                           Dr. Romona Curls table could not accommodate the patient (could not lower it for him to transfer from his wheelchair). We tried Dr. Nelva Bush, Dr. [name] and [facility] IR -- none of these can/will do this procedure on this patient. Does Dr. Erlinda Hong have any other options for the patient? Please call the patient to advise him of the plan.

## 2022-08-28 DIAGNOSIS — M6281 Muscle weakness (generalized): Secondary | ICD-10-CM | POA: Diagnosis not present

## 2022-08-28 DIAGNOSIS — R2681 Unsteadiness on feet: Secondary | ICD-10-CM | POA: Diagnosis not present

## 2022-08-29 DIAGNOSIS — R2681 Unsteadiness on feet: Secondary | ICD-10-CM | POA: Diagnosis not present

## 2022-08-29 DIAGNOSIS — M6281 Muscle weakness (generalized): Secondary | ICD-10-CM | POA: Diagnosis not present

## 2022-08-30 DIAGNOSIS — M6281 Muscle weakness (generalized): Secondary | ICD-10-CM | POA: Diagnosis not present

## 2022-08-30 DIAGNOSIS — R2681 Unsteadiness on feet: Secondary | ICD-10-CM | POA: Diagnosis not present

## 2022-08-31 DIAGNOSIS — R2681 Unsteadiness on feet: Secondary | ICD-10-CM | POA: Diagnosis not present

## 2022-08-31 DIAGNOSIS — M6281 Muscle weakness (generalized): Secondary | ICD-10-CM | POA: Diagnosis not present

## 2022-09-02 ENCOUNTER — Encounter (INDEPENDENT_AMBULATORY_CARE_PROVIDER_SITE_OTHER): Payer: Self-pay

## 2022-09-02 DIAGNOSIS — M6281 Muscle weakness (generalized): Secondary | ICD-10-CM | POA: Diagnosis not present

## 2022-09-02 DIAGNOSIS — R2681 Unsteadiness on feet: Secondary | ICD-10-CM | POA: Diagnosis not present

## 2022-09-03 DIAGNOSIS — M6281 Muscle weakness (generalized): Secondary | ICD-10-CM | POA: Diagnosis not present

## 2022-09-03 DIAGNOSIS — R2681 Unsteadiness on feet: Secondary | ICD-10-CM | POA: Diagnosis not present

## 2022-09-12 NOTE — Telephone Encounter (Signed)
Pt called in stating that [facility] has not reach out to him to schedule appt... Pt stated that he reach out to [facility] and they advised him that Dr. Roda Shutters never sent over the orders... Pt requesting for orders to be placed... Pt requesting for callback.Marland KitchenMarland Kitchen

## 2022-09-13 DIAGNOSIS — Z760 Encounter for issue of repeat prescription: Secondary | ICD-10-CM | POA: Diagnosis not present

## 2022-09-13 DIAGNOSIS — G8929 Other chronic pain: Secondary | ICD-10-CM | POA: Diagnosis not present

## 2022-09-13 NOTE — Telephone Encounter (Signed)
Called patient told him that a nerve block would likely not work but he's free to find someone if he wants.  He knows that he cannot be considered for surgery until weight is done to 275 lbs.  Thanks.

## 2022-09-13 NOTE — Telephone Encounter (Signed)
Ok please let him know that we are basically out of options.

## 2022-09-18 DIAGNOSIS — I509 Heart failure, unspecified: Secondary | ICD-10-CM | POA: Diagnosis not present

## 2022-09-18 DIAGNOSIS — I4891 Unspecified atrial fibrillation: Secondary | ICD-10-CM | POA: Diagnosis not present

## 2022-09-18 DIAGNOSIS — N183 Chronic kidney disease, stage 3 unspecified: Secondary | ICD-10-CM | POA: Diagnosis not present

## 2022-09-20 ENCOUNTER — Ambulatory Visit (INDEPENDENT_AMBULATORY_CARE_PROVIDER_SITE_OTHER): Payer: Medicare Other | Admitting: Internal Medicine

## 2022-09-20 ENCOUNTER — Encounter (HOSPITAL_BASED_OUTPATIENT_CLINIC_OR_DEPARTMENT_OTHER): Payer: Self-pay | Admitting: Internal Medicine

## 2022-09-20 VITALS — BP 117/66 | HR 59 | Ht 70.0 in | Wt 331.0 lb

## 2022-09-20 DIAGNOSIS — I5032 Chronic diastolic (congestive) heart failure: Secondary | ICD-10-CM | POA: Diagnosis not present

## 2022-09-20 DIAGNOSIS — I4821 Permanent atrial fibrillation: Secondary | ICD-10-CM | POA: Diagnosis not present

## 2022-09-20 NOTE — Progress Notes (Signed)
OFFICE NOTE  Chief Complaint:  Leg swelling  Primary Care Physician: [name], MD  HPI:  [name] is a 71 year old male who is currently referred by Dr. Milinda Cave for new onset atrial flutter/fibrillation. [name] was at his office for a routine physical and underwent an EKG. This demonstrated either coarse A. Fib or atrial flutter with variable ventricular response and a rate around 80. It was noted that he was already on metoprolol tartrate 75 mg twice daily for hypertension. 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  He is reportedly unaware of his atrial fibrillation. Fortunately an EKG in the office today shows that he is back in a sinus rhythm or sinus arrhythmia. There is no family history of coronary disease or A. Fib. He denies any chest pain or worsening shortness of breath, but has been having problems with sciatic pain in his back and difficulty ambulating. He reports he gets good sleep at night, rarely wakes up, does not have morning headaches or the need for daytime napping. His screening EPWSS was only 3, suggesting that obstructive sleep apnea is actually fairly unlikely, despite his significant weight.  [name] returns today for follow-up. He underwent a nuclear stress test which was a 2 day study. This demonstrated significant TID 1.48, however it is unclear how reliable this could be because of the fact the study was done on 2 different days. The test was negative for ischemia. EF was calculated at 42%, which is lower than his EF was thought to be at 50%. He denies any chest pain, ever. He also has stable shortness of breath but no worsening symptoms. He reportedly was unaware of A. fib and still has not had any symptoms. He is currently on Xarelto and is not having any bleeding problems.  [name] returns today for follow-up. He reports some  recent worsening of shortness of breath on exertion. He saw Dr. Milinda Cave for this and had some increase in his Lasix. Up to 80 mg daily. He is currently taking 40 mg daily does not notice significant difference in his symptoms. He had a recent echocardiogram which shows an improvement in LV function up to 55-60%. He feels some of this is due to excess weight. He's been less active. He is now close to 400 pounds. Has pain. He has recurrent atrial fibrillation and is in A. fib today to rate is 78. This is been paroxysmal in the past however may be more persistent. He is not aware of his A. fib. He is on Xarelto and is been compliant with that medication. He denies any bleeding problems.   12/24/2016  [name] returns to for follow-up. Initially blood pressure was elevated 166/60 became and 132/64. He has managed to lose about 15 pounds which I commended him on. He denies any chest pain or worsening shortness of breath. He remains in atrial fibrillation with controlled ventricular response of 61. He denies any bleeding problems on Xarelto.  12/15/2017  [name] returns today for annual follow-up.  He has been struggling with lower extremity wound infections.  He is undergoing penicillin injections and wound care management.  He denies any chest pain or worsening shortness of breath however has had about a 12 pound weight gain since I last saw him a year ago.  He says he is going to be working on dietary changes however he is  noted significant swelling of his legs.  There was a suggestion that he may be volume overloaded and he does report some symptoms that sound consistent with orthopnea.  He last had an echo which showed systolic function and 2017.  01/16/2018  [name] was seen today in follow-up.  He reports improvement in his edema.  I increased his Lasix to 80 mg twice daily.  He is lost about 4 pounds since I last saw him.  He denies any worsening shortness of breath.  He continues to struggle with  lower extremity wounds that are slowly healing.  Weight is a significant issue.  An echo was performed which showed normal systolic function and mild RV dysfunction.  Most of his symptoms are likely attributable to that.  11/26/2018  [name] is seen today in follow-up.  Overall he is doing well.  Recently he is lost significant amount of weight.  He was seen by the wound care center who noted that he was put tensive and had a low heart rate.  His diltiazem was decreased from 360 mg to 240 mg daily and his metoprolol was decreased from 75 mg twice daily to 50 mg twice daily.  Today's blood pressure is improved however heart rate remains low at 44.  He seems to be asymptomatic with this.  Denies any worsening shortness of breath or chest pain.  07/05/2019  [name] returns today for follow-up.  He continues to get care in the wound care center.  Unfortunately as he had been losing weight the weight has now come back.  He denies any chest pain or worsening shortness of breath.  His blood pressure appears to be well controlled at home although is a little elevated today.  He has had no significant's symptoms related to A. fib but does remain in persistent A. fib which is rate controlled at 67 today.  12/06/2019  [name] is seen today in follow-up.  He recently has had additional weight gain now up about 10 pounds.  He has had significant swelling.  He is followed very closely at the wound care center however was referred back due to significant volume retention.  Most of this is likely related to right heart failure.  Currently he is supposed to be on 80 mg Lasix twice daily.  After talking with him he said "I only take 2 tablets by mouth daily or 80 mg once daily".. This is of course half the dose that we would recommend.  01/15/2022  [name] returns today for follow-up.  He continues to struggle with lower extremity edema and poorly healing wounds.  Is followed with the wound care center.   Unfortunately he was evicted from his apartment.  He is on a waiting list for a new apartment now but has been living in a motel at a very high rate.  Does not sound like his diet has been ideal either.  Has had some weight gain and less physical activity.  He cannot tolerate twice daily Lasix but is currently on 80 mg once a day.  09/20/2022  [name] is seen today in follow-up.  Several months ago he was admitted with acute on chronic heart failure.  He was diuresed about 80 pounds.  He has been on a regimen of torsemide and metolazone and has maintained a weight in the low 330s.  Today's weight is 331.  This is lower than it was last seen in the office in August by Gillian Shieldsaitlin Walker, NP.  He has been nursing facility and is no longer in a hotel.  He is getting care there.  He says his blood sugar has not been as well controlled due to some disagreement between the facility doctor and his PCP.  He is not on sliding scale insulin.  A1c is 7.6%.  Blood pressure appears to be well controlled.  He remains in A-fib which is longstanding persistent or permanent with heart rate of 59.  He is taking Eliquis without issues.  He said he continues to have bilateral knee pain and has had difficulty walking.  There are potential plans for knee replacement surgery.  PMHx:  Past Medical History:  Diagnosis Date   ALLERGIC RHINITIS 08/11/2006   ASTHMA 08/11/2006   Bradycardia    Beta blocker d/c'd 2022   Chronic atrial fibrillation (HCC) 08/2014   Chronic combined systolic and diastolic CHF (congestive heart failure) [facility])    Cardiology f/u 12/2017: pt volume overloaded (R heart dysf suspected), BNP very high, lasix increased.  Repeat echo 12/2017: normal LV EF, mild DD, +RV syst dysfxn, mod pulm HTN, biatrial enlgmt.   Chronic constipation    Chronic renal insufficiency, stage 3 (moderate) (HCC)    Colon cancer screening 02/2021   02/2021 Cologuard POS->GI ref   DIABETES MELLITUS, TYPE II 08/11/2006    HYPERTENSION 08/11/2006   IDA (iron deficiency anemia)    04/2022.  Iron started.  Needs hemoccults   Impaired mobility and endurance    MRSA infection 06/2018   LL venous stasis ulcer infected   Normocytic anemia 2016-2019   03/2018 B12 normal, iron ok (ferritin borderline low).   OBESITY, MORBID 12/14/2007   saxenda started 05/2020 by [facility] wt mgmt center   OSA (obstructive sleep apnea)    to get sleep study with [facility]) as of 12/03/2018 consult.   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  92018 ID started him on amoxil prophylaxis---ineffective.  End 2018/Jan 2019 penicillin G injections prophyl helpful but pt declined to continue this as of 01/2018 ID f/u.  ID talked him into resuming monthly penicillin G as of 02/2018 f/u.     Restless leg syndrome    Rx'd clonazepam 09/2017 and pt refused to take it after reading the medication's potential side effects.   Venous stasis ulcers of both lower extremities (HCC)    Severe lymphedema.  wound clinic care ongoing as of 01/2018    Past Surgical History:  Procedure Laterality Date   Carotid dopplers  07/23/2018   Left NORMAL.  Right 1-39% ICA stenosis, with <50% distal CCA stenosis (not hemodynamically significant)   LE venous dopplers Bilateral 03/06/2022   no dvt   RIGHT HEART CATH N/A 02/19/2022   Marked volume overload, R>>L HF, preserved CO. Procedure: RIGHT HEART CATH;  Surgeon: Dolores Patty, MD;  Location: [facility] INVASIVE CV LAB;  Service: Cardiovascular;  Laterality: N/A;   TRANSTHORACIC ECHOCARDIOGRAM  11/2007; 09/2014; 11/2015;12/2017   LV fxn normal, EF normal, mild dilation of left atrium.  2015 grade II diast dysfxn.  2017 EF 55-60%. 2019: LVEF 60-65%, mild RV syst dysf,biatrial enlgmt, mod pulm htn. 02/14/22 EF 55-60%, unable to assess diast fxn, LVH, o/w normal   URETERAL STENT PLACEMENT     virtual colonoscopy  01/2011   Normal    FAMHx:   Family History  Problem Relation Age of Onset   Diabetes Mother    Diabetes Sister    Hydrocephalus  Sister        NPH    SOCHx:   reports that he has never smoked. He has never been exposed to tobacco smoke. He has never used smokeless tobacco. He reports that he does not drink alcohol and does not use drugs.  ALLERGIES:  Allergies  Allergen Reactions   Hydralazine Other (See Comments)    Drowsiness/sedation/mental fog    ROS: Pertinent items noted in HPI and remainder of comprehensive ROS otherwise negative.  HOME MEDS: Current Outpatient Medications  Medication Sig Dispense Refill   aluminum sulfate-calcium acetate (DOMEBORO) packet Apply 1 packet topically 3 (three) times daily.     apixaban (ELIQUIS) 5 MG TABS tablet Take 5 mg by mouth 2 (two) times daily.     b complex vitamins capsule Take 1 capsule by mouth every morning.     cetirizine (ZYRTEC) 10 MG tablet Take 10 mg by mouth at bedtime.     diclofenac Sodium (VOLTAREN) 1 % GEL Apply topically in the morning, at noon, and at bedtime.     diltiazem (TIAZAC) 120 MG 24 hr capsule Take 1 capsule (120 mg total) by mouth daily. 90 capsule 3   glucose blood (ONETOUCH ULTRA) test strip CHECK BLOOD SUGAR TWICE  DAILY 200 strip 0   HYDROcodone-acetaminophen (NORCO/VICODIN) 5-325 MG tablet Take 1-2 tablets by mouth every 6 (six) hours as needed for moderate pain. 1-2 tabs po bid prn pain (Patient taking differently: Take 1 tablet by mouth at bedtime. 1-2 tabs po bid prn pain) 10 tablet 0   Insulin Lispro Prot & Lispro (HUMALOG 75/25 MIX) (75-25) 100 UNIT/ML Kwikpen Inject 30 Units into the skin 2 (two) times daily.     Insulin Pen Needle (B-D ULTRAFINE III SHORT PEN) 31G X 8 MM MISC USE 1 PENNEEDLE TWICE DAILY 200 each 1   lidocaine (LIDODERM) 5 % Place 1 patch onto the skin daily. Remove & Discard patch within 12 hours or as directed by MD     metolazone (ZAROXOLYN) 5 MG tablet Take 5 mg by mouth daily. In combination with  Torsemide     Multiple Vitamin (MULTIVITAMIN) tablet Take 1 tablet by mouth daily.     potassium chloride SA (KLOR-CON M20) 20 MEQ tablet Take one tablet three times per week. (Patient taking differently: Take 20 mEq by mouth every Monday, Wednesday, and Friday. Take one tablet three times per week.) 45 tablet 2   senna-docusate (SENOKOT-S) 8.6-50 MG tablet Take 1 tablet by mouth at bedtime.     terazosin (HYTRIN) 10 MG capsule Take 10 mg by mouth at bedtime.     Torsemide 40 MG TABS Take 40 mg by mouth daily.     traMADol (ULTRAM) 50 MG tablet Take 50 mg by mouth daily at 6 (six) AM.     vitamin E 400 UNIT capsule Take 400 Units by mouth every morning. Vitamin E with Selenium     Current Facility-Administered Medications  Medication Dose Route Frequency Provider Last Rate Last Admin   bupivacaine (MARCAINE) 0.5 % (with pres) injection 3 mL  3 mL Other Once Tyrell Antonio, MD        LABS/IMAGING: No results found for this or any previous visit (from the past 48 hour(s)). No results found.  VITALS: BP 117/66 (BP Location: Left Arm, Patient Position: Sitting, Cuff Size: Large)   Pulse (!) 59   Ht 5\' 10"  (1.778 m)   Wt (!) 331 lb (150.1 kg)   SpO2 94%   BMI 47.49  kg/m   EXAM: General appearance: alert, no distress and morbidly obese Neck: no carotid bruit, no JVD and thyroid not enlarged, symmetric, no tenderness/mass/nodules Lungs: clear to auscultation bilaterally Heart: regular rate and rhythm, S1, S2 normal, no murmur, click, rub or gallop Abdomen: soft, non-tender; bowel sounds normal; no masses,  no organomegaly Extremities: edema 2+ bilateral edema, the legs are wrapped Pulses: 2+ and symmetric Skin: Skin color, texture, turgor normal. No rashes or lesions Neurologic: Alert and oriented X 3, normal strength and tone. Normal symmetric reflexes. Normal coordination and gait Psych: Pleasant  EKG: A-fib with slow ventricular response, RBBB-personally  reviewed  ASSESSMENT: Longstanding persistent atrial fibrillation Acute on chronic combined systolic and diastolic heart failure (LVEF 55-60% in 11/2015) PAF-CHADSVASC score of 3 Hypertension Morbid obesity Diabetes type 2 on insulin with nephropathy and neuropathy Dyslipidemia History of arm DVT RBBB  PLAN: 1.   [name] seems to be stable with regards to his weights and fluid status on combination metolazone and torsemide after recent admission for heart failure and marked diuresis.  He is in longstanding persistent A-fib and anticoagulated on Eliquis.  He has significant bilateral knee pain issues and is contemplating knee replacements surgery.  Cholesterol has been well controlled.  No medication changes were made today.  Plan follow-up with [facility] or myself in 3 months.  [name], MD, [facility], FACP  [place]  [facility]  Medical Director of the Advanced Lipid Disorders &  Cardiovascular Risk Reduction Clinic Diplomate of the American Board of Clinical Lipidology Attending Cardiologist  Direct Dial: [phone]  Fax: [phone]  Website:  [email or web address] Nicely Gianlucca Szymborski 09/20/2022, 11:25 AM

## 2022-09-20 NOTE — Patient Instructions (Signed)
Medication Instructions:  NO CHANGES  *If you need a refill on your cardiac medications before your next appointment, please call your pharmacy*   Follow-Up: At [facility], you and your health needs are our priority.  As part of our continuing mission to provide you with exceptional heart care, we have created designated Provider Care Teams.  These Care Teams include your primary Cardiologist (physician) and Advanced Practice Providers (APPs -  Physician Assistants and Nurse Practitioners) who all work together to provide you with the care you need, when you need it.  We recommend signing up for the patient portal called "MyChart".  Sign up information is provided on this After Visit Summary.  MyChart is used to connect with patients for Virtual Visits (Telemedicine).  Patients are able to view lab/test results, encounter notes, upcoming appointments, etc.  Non-urgent messages can be sent to your provider as well.   To learn more about what you can do with MyChart, go to [email or web address].    Your next appointment:   3 month(s)  The format for your next appointment:   In Person  Provider:   Gillian Shields, NP

## 2022-09-23 DIAGNOSIS — L900 Lichen sclerosus et atrophicus: Secondary | ICD-10-CM | POA: Diagnosis not present

## 2022-09-23 NOTE — Progress Notes (Unsigned)
[name] D.[name]. CAQSM [facility] [address] [place] [id number] Phone: [phone]   Assessment and Plan:     There are no diagnoses linked to this encounter.  ***   Pertinent previous records reviewed include ***   Follow Up: ***     Subjective:   I, [name], am serving as a Neurosurgeonscribe for Doctor [name]   Chief Complaint: 2nd opinion    HPI:    05/27/2022 Patient is a 71 year old male complaining of knee pain. Patient states He is upset about his most recent orthopedic evaluation ([facility]He was told he is not a surgical candidate, was told "nothing further to offer".  No records available to me today.He did get a steroid injection in right knee when in the hospital couple months ago and says it did not help at all.  Fluid analysis at that time apparently did show intracellular monosodium urate crystals, WBC 4.5K. he wants to walk out of the hospital,   09/24/2022 Patient states    Relevant Historical Information: Atrial fibrillation on chronic anticoagulation with Eliquis, elevated BMI, DM type   Additional pertinent review of systems negative.   Current Outpatient Medications:    aluminum sulfate-calcium acetate (DOMEBORO) packet, Apply 1 packet topically 3 (three) times daily., Disp: , Rfl:    apixaban (ELIQUIS) 5 MG TABS tablet, Take 5 mg by mouth 2 (two) times daily., Disp: , Rfl:    b complex vitamins capsule, Take 1 capsule by mouth every morning., Disp: , Rfl:    cetirizine (ZYRTEC) 10 MG tablet, Take 10 mg by mouth at bedtime., Disp: , Rfl:    diclofenac Sodium (VOLTAREN) 1 % GEL, Apply topically in the morning, at noon, and at bedtime., Disp: , Rfl:    diltiazem (TIAZAC) 120 MG 24 hr capsule, Take 1 capsule (120 mg total) by mouth daily., Disp: 90 capsule, Rfl: 3   glucose blood (ONETOUCH ULTRA) test strip, CHECK BLOOD SUGAR TWICE  DAILY, Disp: 200 strip, Rfl: 0    HYDROcodone-acetaminophen (NORCO/VICODIN) 5-325 MG tablet, Take 1-2 tablets by mouth every 6 (six) hours as needed for moderate pain. 1-2 tabs po bid prn pain (Patient taking differently: Take 1 tablet by mouth at bedtime. 1-2 tabs po bid prn pain), Disp: 10 tablet, Rfl: 0   Insulin Lispro Prot & Lispro (HUMALOG 75/25 MIX) (75-25) 100 UNIT/ML Kwikpen, Inject 30 Units into the skin 2 (two) times daily., Disp: , Rfl:    Insulin Pen Needle (B-D ULTRAFINE III SHORT PEN) 31G X 8 MM MISC, USE 1 PENNEEDLE TWICE DAILY, Disp: 200 each, Rfl: 1   lidocaine (LIDODERM) 5 %, Place 1 patch onto the skin daily. Remove & Discard patch within 12 hours or as directed by MD, Disp: , Rfl:    metolazone (ZAROXOLYN) 5 MG tablet, Take 5 mg by mouth daily. In combination with Torsemide, Disp: , Rfl:    Multiple Vitamin (MULTIVITAMIN) tablet, Take 1 tablet by mouth daily., Disp: , Rfl:    potassium chloride SA (KLOR-CON M20) 20 MEQ tablet, Take one tablet three times per week. (Patient taking differently: Take 20 mEq by mouth every Monday, Wednesday, and Friday. Take one tablet three times per week.), Disp: 45 tablet, Rfl: 2   senna-docusate (SENOKOT-S) 8.6-50 MG tablet, Take 1 tablet by mouth at bedtime., Disp: , Rfl:    terazosin (HYTRIN) 10 MG capsule, Take 10 mg by mouth at bedtime., Disp: , Rfl:  Torsemide 40 MG TABS, Take 40 mg by mouth daily., Disp: , Rfl:    traMADol (ULTRAM) 50 MG tablet, Take 50 mg by mouth daily at 6 (six) AM., Disp: , Rfl:    vitamin E 400 UNIT capsule, Take 400 Units by mouth every morning. Vitamin E with Selenium, Disp: , Rfl:   Current Facility-Administered Medications:    bupivacaine (MARCAINE) 0.5 % (with pres) injection 3 mL, 3 mL, Other, Once, Kevin Antonio, MD   Objective:     There were no vitals filed for this visit.    There is no height or weight on file to calculate BMI.    Physical Exam:    ***   Electronically signed by:  [name] D.[name] [facility] 7:21 AM 09/23/22

## 2022-09-24 ENCOUNTER — Ambulatory Visit (INDEPENDENT_AMBULATORY_CARE_PROVIDER_SITE_OTHER): Payer: Medicare Other

## 2022-09-24 ENCOUNTER — Ambulatory Visit (INDEPENDENT_AMBULATORY_CARE_PROVIDER_SITE_OTHER): Payer: Medicare Other | Admitting: Sports Medicine

## 2022-09-24 VITALS — HR 93 | Ht 70.0 in | Wt 331.0 lb

## 2022-09-24 DIAGNOSIS — M1711 Unilateral primary osteoarthritis, right knee: Secondary | ICD-10-CM | POA: Diagnosis not present

## 2022-09-24 DIAGNOSIS — M1712 Unilateral primary osteoarthritis, left knee: Secondary | ICD-10-CM

## 2022-09-24 DIAGNOSIS — M25561 Pain in right knee: Secondary | ICD-10-CM

## 2022-09-24 DIAGNOSIS — G8929 Other chronic pain: Secondary | ICD-10-CM

## 2022-09-24 DIAGNOSIS — M25562 Pain in left knee: Secondary | ICD-10-CM

## 2022-09-24 NOTE — Patient Instructions (Addendum)
Good to see you  Referral to orthopedic surgery  As needed follow up

## 2022-10-02 DIAGNOSIS — Z760 Encounter for issue of repeat prescription: Secondary | ICD-10-CM | POA: Diagnosis not present

## 2022-10-02 DIAGNOSIS — G8929 Other chronic pain: Secondary | ICD-10-CM | POA: Diagnosis not present

## 2022-10-04 DIAGNOSIS — B372 Candidiasis of skin and nail: Secondary | ICD-10-CM | POA: Diagnosis not present

## 2022-10-07 ENCOUNTER — Ambulatory Visit (INDEPENDENT_AMBULATORY_CARE_PROVIDER_SITE_OTHER): Payer: Medicare Other | Admitting: Orthopaedic Surgery

## 2022-10-07 ENCOUNTER — Encounter: Payer: Self-pay | Admitting: Orthopaedic Surgery

## 2022-10-07 VITALS — Ht 70.0 in | Wt 331.0 lb

## 2022-10-07 DIAGNOSIS — M1711 Unilateral primary osteoarthritis, right knee: Secondary | ICD-10-CM

## 2022-10-07 DIAGNOSIS — M25561 Pain in right knee: Secondary | ICD-10-CM | POA: Diagnosis not present

## 2022-10-07 DIAGNOSIS — G8929 Other chronic pain: Secondary | ICD-10-CM | POA: Diagnosis not present

## 2022-10-07 DIAGNOSIS — M1712 Unilateral primary osteoarthritis, left knee: Secondary | ICD-10-CM

## 2022-10-07 DIAGNOSIS — M25562 Pain in left knee: Secondary | ICD-10-CM

## 2022-10-07 NOTE — Progress Notes (Signed)
The patient is a 71 year old gentleman that I am seeing for the first time.  However he has seen my partner Dr. 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  I am not sure now why he is sent to me to consider surgery on his knees when he was shown to not be a surgical candidate by Dr. Roda Shutters which was an appropriate assessment.  The patient is a poorly controlled diabetic.  His last hemoglobin A1c was the lowest of seen which is 7.6.  He has wraps on both of his legs due to weeping venous stasis wounds.  He is morbidly obese with a BMI of 47.49.  He would have to have a weight down to under 300 and even around 275 before surgery could be considered but he would also have to have better controlled diabetes and not venous stasis wounds on his legs which would make him be a higher infection risk overall.  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  He is on Xarelto as well.  He has a lot of fluid retention.  From my standpoint, he is not a surgical candidate and I would not be comfortable proceeding with any type of joint replacement on his knees.

## 2022-10-08 ENCOUNTER — Telehealth: Payer: Self-pay | Admitting: *Deleted

## 2022-10-08 NOTE — Patient Instructions (Signed)
Visit Information  Thank you for taking time to visit with me today. Please don't hesitate to contact me if I can be of assistance to you.   Following are the goals we discussed today:   Goals Addressed               This Visit's Progress     COMPLETED: care coordination activity (pt-stated)        Care Coordination Interventions: Advised patient to contact care management services via [facility] contact or through his provider's office upon his discharged from the SNF  Provided education to patient re: disease management related to DM/CHF Assessed social determinant of health barriers Pt current residing at a SNF for ongoing rehab. RN advised pt to speak with the social worker or case manager due to being homeless upon his discharge date. Discussed levels of care for possible long term placement as an option.         Please call the care guide team at [phone] if you need to cancel or reschedule your appointment.   If you are experiencing a Mental Health or Behavioral Health Crisis or need someone to talk to, please call the Suicide and Crisis Lifeline: 988 call the [place] National Suicide Prevention Lifeline: 408 736 9363 or TTY: 4454622360 TTY 307-041-3511) to talk to a trained counselor call 1-800-273-TALK (toll free, 24 hour hotline)  Patient verbalizes understanding of instructions and care plan provided today and agrees to view in MyChart. Active MyChart status and patient understanding of how to access instructions and care plan via MyChart confirmed with patient.     No further follow up required: No further follow up needs at this time (Currently at a SNF level of care).  Elliot Cousin, RN Care Management Coordinator [facility] Main Office [phone]

## 2022-10-08 NOTE — Patient Outreach (Signed)
  Care Coordination   Initial Visit Note   10/08/2022 Name: [name] MRN: [id number] DOB: 05/22/1951  [name] is a 71 y.o. year old male who sees McGowen, Maryjean Morn, MD for primary care. I spoke with  [name] by phone today.  What matters to the patients health and wellness today?  Education related DM/CHF. Pt would like to be able to manage these condition once he is discharged from the facility in a safe place.    Goals Addressed               This Visit's Progress     COMPLETED: care coordination activity (pt-stated)        Care Coordination Interventions: Advised patient to contact care management services via [facility] contact or through his provider's office upon his discharged from the SNF  Provided education to patient re: disease management related to DM/CHF Assessed social determinant of health barriers Pt current residing at a SNF for ongoing rehab. RN advised pt to speak with the social worker or case manager due to being homeless upon his discharge date. Discussed levels of care for possible long term placement as an option.         SDOH assessments and interventions completed:  Yes  SDOH Interventions Today    Flowsheet Row Most Recent Value  SDOH Interventions   Transportation Interventions Intervention Not Indicated  Utilities Interventions Intervention Not Indicated        Care Coordination Interventions:  Yes, provided   Follow up plan: No further intervention required.   Encounter Outcome:  Pt. Visit Completed   Elliot Cousin, RN Care Management Coordinator [facility] Main Office [phone]

## 2022-10-10 ENCOUNTER — Encounter: Payer: Self-pay | Admitting: Family Medicine

## 2022-10-10 ENCOUNTER — Ambulatory Visit (INDEPENDENT_AMBULATORY_CARE_PROVIDER_SITE_OTHER): Payer: Medicare Other | Admitting: Family Medicine

## 2022-10-10 VITALS — BP 136/73 | HR 64 | Temp 97.4°F | Ht 70.0 in

## 2022-10-10 DIAGNOSIS — M6281 Muscle weakness (generalized): Secondary | ICD-10-CM | POA: Diagnosis not present

## 2022-10-10 DIAGNOSIS — M25511 Pain in right shoulder: Secondary | ICD-10-CM | POA: Diagnosis not present

## 2022-10-10 DIAGNOSIS — N390 Urinary tract infection, site not specified: Secondary | ICD-10-CM

## 2022-10-10 DIAGNOSIS — D509 Iron deficiency anemia, unspecified: Secondary | ICD-10-CM

## 2022-10-10 DIAGNOSIS — N3001 Acute cystitis with hematuria: Secondary | ICD-10-CM

## 2022-10-10 DIAGNOSIS — E1121 Type 2 diabetes mellitus with diabetic nephropathy: Secondary | ICD-10-CM

## 2022-10-10 DIAGNOSIS — I5032 Chronic diastolic (congestive) heart failure: Secondary | ICD-10-CM

## 2022-10-10 LAB — POCT GLYCOSYLATED HEMOGLOBIN (HGB A1C)
HbA1c POC (<> result, manual entry): 9.5 % (ref 4.0–5.6)
HbA1c, POC (controlled diabetic range): 9.5 % — AB (ref 0.0–7.0)
HbA1c, POC (prediabetic range): 9.5 % — AB (ref 5.7–6.4)
Hemoglobin A1C: 9.5 % — AB (ref 4.0–5.6)

## 2022-10-10 MED ORDER — SULFAMETHOXAZOLE-TRIMETHOPRIM 800-160 MG PO TABS: 1.0000 | ORAL_TABLET | Freq: Two times a day (BID) | ORAL | 0 refills | Status: DC

## 2022-10-10 NOTE — Progress Notes (Signed)
OFFICE VISIT  10/10/2022  CC:  Chief Complaint  Patient presents with   Diabetes   Chronic Kidney Disease    Patient is a 71 y.o. male who presents for 6456-month follow-up diabetes, chronic renal insufficiency stage III, and chronic diastolic heart failure. A/P as of last visit: "#1 chronic diastolic heart failure with pulmonary hypertension. Gradual weight gain is worrisome.  Unfortunately, I have no say in his diuretic regimen.  He has managed by providers at his assisted living facility and when I have made recommendations in the past they have not been done. At this point he will continue metolazone 5 mg a day and torsemide 40 mg a day.  Unfortunately his diet is not low-salt at his assisted living facility.   #2 type 2 diabetes.  His control is pretty good on 30 units of 7030 twice a day. POC Hba1c today is 7.6%.  No changes recommended.   3 chronic renal insufficiency stage IIIb. Renal function and potassium stable 3 weeks ago.  No recent change in diuretic regimen. We will repeat renal function at next follow-up in 3 months."  INTERIM HX: His biggest issue is still has chronic bilateral knee pain from severe osteoarthritis. He is very frustrated.  Was told he could possibly get knee replacements if hemoglobin A1c was less than 7% and if he lost 40 pounds.  I would also have to make sure he did not have infection in his lower legs. However, he states he was told by a an orthopedist at the same practice that he was actually not a candidate for surgery, nor was a good candidate for genicular nerve ablation.  Diet not good--she still lives at his [name]. He gets 30 units of Humalog 75/25 twice a day no matter what his sugar is.  The last week or so he has felt increased urinary urgency and frequency as well as cloudy and malodorous urine.  ROS as above, plus--> no fevers, no CP, no SOB, no wheezing, no cough, no dizziness, no HAs, no rashes, no melena/hematochezia.  No polyuria or  polydipsia.  No myalgias or arthralgias.  No focal weakness, paresthesias, or tremors.  No acute vision or hearing abnormalities.   No recent changes in lower legs. No n/v/d or abd pain.  No palpitations.    Past Medical History:  Diagnosis Date   ALLERGIC RHINITIS 08/11/2006   ASTHMA 08/11/2006   Bradycardia    Beta blocker d/c'd 2022   Chronic atrial fibrillation (HCC) 08/2014   Chronic combined systolic and diastolic CHF (congestive heart failure) [facility](HCC)    Cardiology f/u 12/2017: pt volume overloaded (R heart dysf suspected), BNP very high, lasix increased.  Repeat echo 12/2017: normal LV EF, mild DD, +RV syst dysfxn, mod pulm HTN, biatrial enlgmt.   Chronic constipation    Chronic renal insufficiency, stage 3 (moderate) (HCC)    Colon cancer screening 02/2021   02/2021 Cologuard POS->GI ref   DIABETES MELLITUS, TYPE II 08/11/2006   HYPERTENSION 08/11/2006   IDA (iron deficiency anemia)    04/2022.  Iron started.  Needs hemoccults   Impaired mobility and endurance    MRSA infection 06/2018   LL venous stasis ulcer infected   Normocytic anemia 2016-2019   03/2018 B12 normal, iron ok (ferritin borderline low).   OBESITY, MORBID 12/14/2007   saxenda started 05/2020 by [facility] wt mgmt center   OSA (obstructive sleep apnea)    to get sleep study with [facility] as of 12/03/2018 consult.  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  [id number] ID started him on amoxil prophylaxis---ineffective.  End 2018/Jan 2019 penicillin G injections prophyl helpful but pt declined to continue this as of 01/2018 ID f/u.  ID talked him into resuming monthly penicillin G as of 02/2018 f/u.     Restless leg syndrome    Rx'd clonazepam 09/2017 and pt refused to take it after reading the medication's potential side effects.   Venous stasis ulcers of both lower extremities (HCC)    Severe lymphedema.  wound clinic care ongoing as of 01/2018     Past Surgical History:  Procedure Laterality Date   Carotid dopplers  07/23/2018   Left NORMAL.  Right 1-39% ICA stenosis, with <50% distal CCA stenosis (not hemodynamically significant)   LE venous dopplers Bilateral 03/06/2022   no dvt   RIGHT HEART CATH N/A 02/19/2022   Marked volume overload, R>>L HF, preserved CO. Procedure: RIGHT HEART CATH;  Surgeon: Dolores Patty, MD;  Location: [facility] INVASIVE CV LAB;  Service: Cardiovascular;  Laterality: N/A;   TRANSTHORACIC ECHOCARDIOGRAM  11/2007; 09/2014; 11/2015;12/2017   LV fxn normal, EF normal, mild dilation of left atrium.  2015 grade II diast dysfxn.  2017 EF 55-60%. 2019: LVEF 60-65%, mild RV syst dysf,biatrial enlgmt, mod pulm htn. 02/14/22 EF 55-60%, unable to assess diast fxn, LVH, o/w normal   URETERAL STENT PLACEMENT     virtual colonoscopy  01/2011   Normal    Outpatient Medications Prior to Visit  Medication Sig Dispense Refill   apixaban (ELIQUIS) 5 MG TABS tablet Take 5 mg by mouth 2 (two) times daily.     b complex vitamins capsule Take 1 capsule by mouth every morning.     cetirizine (ZYRTEC) 10 MG tablet Take 10 mg by mouth at bedtime.     diclofenac Sodium (VOLTAREN) 1 % GEL Apply topically in the morning, at noon, and at bedtime.     diltiazem (TIAZAC) 120 MG 24 hr capsule Take 1 capsule (120 mg total) by mouth daily. 90 capsule 3   glucose blood (ONETOUCH ULTRA) test strip CHECK BLOOD SUGAR TWICE  DAILY 200 strip 0   HYDROcodone-acetaminophen (NORCO/VICODIN) 5-325 MG tablet Take 1-2 tablets by mouth every 6 (six) hours as needed for moderate pain. 1-2 tabs po bid prn pain (Patient taking differently: Take 1 tablet by mouth at bedtime. 1-2 tabs po bid prn pain) 10 tablet 0   Insulin Lispro Prot & Lispro (HUMALOG 75/25 MIX) (75-25) 100 UNIT/ML Kwikpen Inject 30 Units into the skin 2 (two) times daily.     Insulin Pen Needle (B-D ULTRAFINE III SHORT PEN) 31G X 8 MM MISC USE 1 PENNEEDLE TWICE DAILY 200 each 1   metolazone  (ZAROXOLYN) 5 MG tablet Take 5 mg by mouth daily. In combination with Torsemide     Multiple Vitamin (MULTIVITAMIN) tablet Take 1 tablet by mouth daily.     potassium chloride SA (KLOR-CON M20) 20 MEQ tablet Take one tablet three times per week. (Patient taking differently: Take 20 mEq by mouth every Monday, Wednesday, and Friday. Take one tablet three times per week.) 45 tablet 2   senna-docusate (SENOKOT-S) 8.6-50 MG tablet Take 1 tablet by mouth at bedtime.     terazosin (HYTRIN) 10 MG capsule Take 10 mg by mouth at bedtime.     Torsemide 40 MG TABS Take 40 mg by mouth daily.     traMADol (ULTRAM) 50 MG tablet  Take 50 mg by mouth daily at 6 (six) AM.     vitamin E 400 UNIT capsule Take 400 Units by mouth every morning. Vitamin E with Selenium     aluminum sulfate-calcium acetate (DOMEBORO) packet Apply 1 packet topically 3 (three) times daily. (Patient not taking: Reported on 10/10/2022)     lidocaine (LIDODERM) 5 % Place 1 patch onto the skin daily. Remove & Discard patch within 12 hours or as directed by MD (Patient not taking: Reported on 10/10/2022)     Facility-Administered Medications Prior to Visit  Medication Dose Route Frequency Provider Last Rate Last Admin   bupivacaine (MARCAINE) 0.5 % (with pres) injection 3 mL  3 mL Other Once Tyrell Antonio, MD        Allergies  Allergen Reactions   Hydralazine Other (See Comments)    Drowsiness/sedation/mental fog   ROS As per HPI  PE:    10/10/2022    2:01 PM 10/07/2022    1:34 PM 09/24/2022   10:46 AM  Vitals with BMI  Height 5\' 10"  5\' 10"  5\' 10"   Weight  331 lbs 331 lbs  BMI  47.49 47.49  Systolic 136    Diastolic 73    Pulse 64  93    Physical Exam  Gen: Alert, well appearing.  Patient is oriented to person, place, time, and situation. AFFECT: pleasant, lucid thought and speech. No further exam today  LABS:  Last CBC Lab Results  Component Value Date   WBC 8.2 07/13/2022   HGB 12.0 (L) 07/13/2022   HCT 38.6 (L)  07/13/2022   MCV 79.1 (L) 07/13/2022   MCH 24.6 (L) 07/13/2022   RDW 18.4 (H) 07/13/2022   PLT 245 07/13/2022   Lab Results  Component Value Date   IRON 22 (L) 04/11/2022   TIBC 299 04/11/2022   FERRITIN 71 04/11/2022   Last metabolic panel Lab Results  Component Value Date   GLUCOSE 176 (H) 07/13/2022   NA 136 07/13/2022   K 4.0 07/13/2022   CL 100 07/13/2022   CO2 27 07/13/2022   BUN 21 07/13/2022   CREATININE 1.40 (H) 07/13/2022   GFRNONAA 54 (L) 07/13/2022   CALCIUM 8.4 (L) 07/13/2022   PHOS 4.2 02/14/2022   PROT 7.2 07/13/2022   ALBUMIN 3.1 (L) 07/13/2022   BILITOT 1.2 07/13/2022   ALKPHOS 93 07/13/2022   AST 15 07/13/2022   ALT 12 07/13/2022   ANIONGAP 9 07/13/2022   Last lipids Lab Results  Component Value Date   CHOL 101 04/04/2021   HDL 41 04/04/2021   LDLCALC 46 04/04/2021   TRIG 61 04/04/2021   CHOLHDL 2.5 04/04/2021   Last hemoglobin A1c Lab Results  Component Value Date   HGBA1C 9.5 (A) 10/10/2022   HGBA1C 9.5 10/10/2022   HGBA1C 9.5 (A) 10/10/2022   HGBA1C 9.5 (A) 10/10/2022    IMPRESSION AND PLAN:  #1 diabetes with nephropathy. Hemoglobin A1c today is 9.5%. Will check urine microalbumin/creatinine as well as basic metabolic panel today. Increase Humalog 75/25 to 38 units every morning and 32 units every afternoon. He will try to work on portion size.  #2 UTI. Urinalysis today showed 1+ blood, 1+ protein, 3+ leukocytes.  Specific gravity 1.025. Send urine for culture/sensitivities. Start Bactrim double strength 1 twice daily x 7 days.  (Rx printed and handed to pt today).  #3 chronic diastolic heart failure. His weight is up 11 pounds over the last few months. If serum creatinine is stable today and if  he continues to gain weight at his follow-up in 7 to 10 days then we will increase diuretics, at least short-term.  #4 chronic pain syndrome.  He has severe osteoarthritis both knees and is essentially unable to ambulate. For various  reasons, knee replacement surgery is not on the table at this time. He may choose to ask his orthopedist for second opinion. Will work on pain control. I recommended oxycodone 5 mg, 1 twice a day.  Stop Vicodin and tramadol.  #5 chronic renal insufficiency stage III. Electrolytes and creatinine today.  6.  Iron deficiency anemia. Still have not been able to get any Hemoccult on him. His [name] med list does not list any iron. Rechecking CBC and iron panel today.  Of note, he is living at an assisted living facility and is under the care of a physician there. Any recommendations I make here still have to pass through there.  An After Visit Summary was printed and given to the patient.  FOLLOW UP: Return for 7-10d f/u uti/weight.  Signed:  Santiago Bumpers, MD           10/10/2022

## 2022-10-11 ENCOUNTER — Telehealth: Payer: Self-pay

## 2022-10-11 ENCOUNTER — Other Ambulatory Visit: Payer: Self-pay

## 2022-10-11 DIAGNOSIS — M6281 Muscle weakness (generalized): Secondary | ICD-10-CM | POA: Diagnosis not present

## 2022-10-11 DIAGNOSIS — M19011 Primary osteoarthritis, right shoulder: Secondary | ICD-10-CM | POA: Diagnosis not present

## 2022-10-11 DIAGNOSIS — M25511 Pain in right shoulder: Secondary | ICD-10-CM | POA: Diagnosis not present

## 2022-10-11 DIAGNOSIS — M79631 Pain in right forearm: Secondary | ICD-10-CM | POA: Diagnosis not present

## 2022-10-11 DIAGNOSIS — R300 Dysuria: Secondary | ICD-10-CM | POA: Diagnosis not present

## 2022-10-11 DIAGNOSIS — Z87440 Personal history of urinary (tract) infections: Secondary | ICD-10-CM | POA: Diagnosis not present

## 2022-10-11 DIAGNOSIS — N390 Urinary tract infection, site not specified: Secondary | ICD-10-CM | POA: Diagnosis not present

## 2022-10-11 LAB — POCT URINALYSIS DIPSTICK
Glucose, UA: NEGATIVE
Ketones, UA: POSITIVE
Nitrite, UA: NEGATIVE
Protein, UA: POSITIVE — AB
Spec Grav, UA: 1.025 (ref 1.010–1.025)
Urobilinogen, UA: 1 E.U./dL
pH, UA: 6 (ref 5.0–8.0)

## 2022-10-11 LAB — BASIC METABOLIC PANEL
BUN: 38 mg/dL — ABNORMAL HIGH (ref 6–23)
CO2: 35 mEq/L — ABNORMAL HIGH (ref 19–32)
Calcium: 8.5 mg/dL (ref 8.4–10.5)
Chloride: 94 mEq/L — ABNORMAL LOW (ref 96–112)
Creatinine, Ser: 1.37 mg/dL (ref 0.40–1.50)
GFR: 51.9 mL/min — ABNORMAL LOW (ref >60.00)
Glucose, Bld: 233 mg/dL — ABNORMAL HIGH (ref 70–99)
Potassium: 3.9 mEq/L (ref 3.5–5.1)
Sodium: 135 mEq/L (ref 135–145)

## 2022-10-11 LAB — CBC
HCT: 39.7 % (ref 39.0–52.0)
Hemoglobin: 12.8 g/dL — ABNORMAL LOW (ref 13.0–17.0)
MCHC: 32.3 g/dL (ref 30.0–36.0)
MCV: 81.1 fl (ref 78.0–100.0)
Platelets: 219 10*3/uL (ref 150.0–400.0)
RBC: 4.9 Mil/uL (ref 4.22–5.81)
RDW: 18.5 % — ABNORMAL HIGH (ref 11.5–15.5)
WBC: 9.6 10*3/uL (ref 4.0–10.5)

## 2022-10-11 LAB — MICROALBUMIN / CREATININE URINE RATIO
Creatinine,U: 154.2 mg/dL
Microalb Creat Ratio: 3 mg/g (ref 0.0–30.0)
Microalb, Ur: 4.6 mg/dL — ABNORMAL HIGH (ref 0.0–1.9)

## 2022-10-11 LAB — URINE CULTURE
MICRO NUMBER:: 14285106
SPECIMEN QUALITY:: ADEQUATE

## 2022-10-11 LAB — IRON,TIBC AND FERRITIN PANEL
%SAT: 13 % (calc) — ABNORMAL LOW (ref 20–48)
Ferritin: 39 ng/mL (ref 24–380)
Iron: 40 ug/dL — ABNORMAL LOW (ref 50–180)
TIBC: 314 mcg/dL (calc) (ref 250–425)

## 2022-10-11 MED ORDER — BUTALBITAL-APAP-CAFF-COD 50-325-40-30 MG PO CAPS: ORAL_CAPSULE | ORAL | 5 refills | Status: DC

## 2022-10-11 NOTE — Telephone Encounter (Signed)
Please Advise

## 2022-10-11 NOTE — Telephone Encounter (Signed)
Yes, I prefer to be his managing physician.

## 2022-10-11 NOTE — Telephone Encounter (Signed)
Eschelle, Nurse Manager calling from [facility] facility where patient is currently staying.  She states she needs to know if Dr. Milinda Cave will be his PCP or will the physician that is over the facility.  Patient brought prescription order to nurse this morning, patient was seen by Dr. Milinda Cave yesterday. Nurse states that wont be able to fill the order for the Bactrim.  She asked if we had a doctor on call after our office is closed, I told her yes.    Please call Eschelle at facility number [phone] or her cell is [phone].

## 2022-10-11 NOTE — Telephone Encounter (Signed)
Advised someone at the facility of the recommendations to provide information to nurse care manager.

## 2022-10-12 DIAGNOSIS — M6281 Muscle weakness (generalized): Secondary | ICD-10-CM | POA: Diagnosis not present

## 2022-10-14 DIAGNOSIS — M6281 Muscle weakness (generalized): Secondary | ICD-10-CM | POA: Diagnosis not present

## 2022-10-14 NOTE — Telephone Encounter (Signed)
No, I will not do this. I guess I am no longer his PCP since I cannot satisfy the requirements.

## 2022-10-14 NOTE — Telephone Encounter (Signed)
Nursing manager wants to know if PCP is going to continue to be primary care, will provider be coming out to see pt q 3 months. All medication will need to have signatures and come from PCP. States pt will need to be able to have 24 hr care from PCP, which they will need to have a phone number for any emergency that may happen with pt. They will need office notes and labs as well. This is the first time they have had a resident use an outside PCP and there are requirements that they have to have to stay in compliance. When asked for documentation to be sent to the office for PCP to know exact information that is needed, nurse manager could not provide any information other than to look it up online. Please advise

## 2022-10-15 ENCOUNTER — Encounter: Payer: Self-pay | Admitting: Family Medicine

## 2022-10-15 DIAGNOSIS — E1122 Type 2 diabetes mellitus with diabetic chronic kidney disease: Secondary | ICD-10-CM | POA: Diagnosis not present

## 2022-10-15 DIAGNOSIS — M6281 Muscle weakness (generalized): Secondary | ICD-10-CM | POA: Diagnosis not present

## 2022-10-15 NOTE — Telephone Encounter (Signed)
Spoke with [employer] regarding message below. She will notify staff and patient of this update.

## 2022-10-17 DIAGNOSIS — M6281 Muscle weakness (generalized): Secondary | ICD-10-CM | POA: Diagnosis not present

## 2022-10-17 DIAGNOSIS — L900 Lichen sclerosus et atrophicus: Secondary | ICD-10-CM | POA: Diagnosis not present

## 2022-10-18 DIAGNOSIS — N390 Urinary tract infection, site not specified: Secondary | ICD-10-CM | POA: Diagnosis not present

## 2022-10-18 DIAGNOSIS — I509 Heart failure, unspecified: Secondary | ICD-10-CM | POA: Diagnosis not present

## 2022-10-18 DIAGNOSIS — M25561 Pain in right knee: Secondary | ICD-10-CM | POA: Diagnosis not present

## 2022-10-18 DIAGNOSIS — N183 Chronic kidney disease, stage 3 unspecified: Secondary | ICD-10-CM | POA: Diagnosis not present

## 2022-10-18 DIAGNOSIS — I4891 Unspecified atrial fibrillation: Secondary | ICD-10-CM | POA: Diagnosis not present

## 2022-10-18 DIAGNOSIS — I5030 Unspecified diastolic (congestive) heart failure: Secondary | ICD-10-CM | POA: Diagnosis not present

## 2022-10-18 DIAGNOSIS — Z91119 Patient's noncompliance with dietary regimen due to unspecified reason: Secondary | ICD-10-CM | POA: Diagnosis not present

## 2022-10-18 DIAGNOSIS — G2581 Restless legs syndrome: Secondary | ICD-10-CM | POA: Diagnosis not present

## 2022-10-18 DIAGNOSIS — M6281 Muscle weakness (generalized): Secondary | ICD-10-CM | POA: Diagnosis not present

## 2022-10-21 ENCOUNTER — Ambulatory Visit: Payer: Medicare Other | Admitting: Family Medicine

## 2022-10-21 DIAGNOSIS — M6281 Muscle weakness (generalized): Secondary | ICD-10-CM | POA: Diagnosis not present

## 2022-10-21 DIAGNOSIS — Z79899 Other long term (current) drug therapy: Secondary | ICD-10-CM | POA: Diagnosis not present

## 2022-10-21 DIAGNOSIS — E119 Type 2 diabetes mellitus without complications: Secondary | ICD-10-CM | POA: Diagnosis not present

## 2022-10-23 DIAGNOSIS — M6281 Muscle weakness (generalized): Secondary | ICD-10-CM | POA: Diagnosis not present

## 2022-10-23 DIAGNOSIS — I5033 Acute on chronic diastolic (congestive) heart failure: Secondary | ICD-10-CM | POA: Diagnosis not present

## 2022-10-23 DIAGNOSIS — E1122 Type 2 diabetes mellitus with diabetic chronic kidney disease: Secondary | ICD-10-CM | POA: Diagnosis not present

## 2022-10-24 DIAGNOSIS — E1159 Type 2 diabetes mellitus with other circulatory complications: Secondary | ICD-10-CM | POA: Diagnosis not present

## 2022-10-24 DIAGNOSIS — B351 Tinea unguium: Secondary | ICD-10-CM | POA: Diagnosis not present

## 2022-10-24 DIAGNOSIS — M6281 Muscle weakness (generalized): Secondary | ICD-10-CM | POA: Diagnosis not present

## 2022-10-25 DIAGNOSIS — Z79899 Other long term (current) drug therapy: Secondary | ICD-10-CM | POA: Diagnosis not present

## 2022-10-25 DIAGNOSIS — E611 Iron deficiency: Secondary | ICD-10-CM | POA: Diagnosis not present

## 2022-10-25 DIAGNOSIS — J302 Other seasonal allergic rhinitis: Secondary | ICD-10-CM | POA: Diagnosis not present

## 2022-10-29 DIAGNOSIS — M6281 Muscle weakness (generalized): Secondary | ICD-10-CM | POA: Diagnosis not present

## 2022-10-30 DIAGNOSIS — M6281 Muscle weakness (generalized): Secondary | ICD-10-CM | POA: Diagnosis not present

## 2022-10-31 DIAGNOSIS — M6281 Muscle weakness (generalized): Secondary | ICD-10-CM | POA: Diagnosis not present

## 2022-11-02 DIAGNOSIS — M25512 Pain in left shoulder: Secondary | ICD-10-CM | POA: Diagnosis not present

## 2022-11-03 DIAGNOSIS — M19012 Primary osteoarthritis, left shoulder: Secondary | ICD-10-CM | POA: Diagnosis not present

## 2022-11-03 DIAGNOSIS — M25512 Pain in left shoulder: Secondary | ICD-10-CM | POA: Diagnosis not present

## 2022-11-25 ENCOUNTER — Telehealth: Payer: Self-pay | Admitting: Family Medicine

## 2022-11-25 NOTE — Telephone Encounter (Signed)
Please advise on message below.

## 2022-11-25 NOTE — Telephone Encounter (Signed)
Jarquis Zingale called into the office with Rix from [facility] [address]. A PA is being requested for medication Butalbital. I advised them both that PA's are no longer handled within office here and that a different team will handle this request. I know Ms. Harbold is no longer being treated here due to living in a facility. I informed Rix to fax over a PA request and that's when she informed me they do not due faxes and provided me with a number that the pharmacy team can call [phone].

## 2022-11-26 ENCOUNTER — Other Ambulatory Visit: Payer: Self-pay | Admitting: Family Medicine

## 2022-11-27 ENCOUNTER — Encounter: Payer: Self-pay | Admitting: Family Medicine

## 2022-11-27 MED ORDER — BUTALBITAL-APAP-CAFF-COD 50-325-40-30 MG PO CAPS: ORAL_CAPSULE | ORAL | 5 refills | Status: DC

## 2022-11-27 NOTE — Telephone Encounter (Signed)
Med pending for refill by provider

## 2022-11-27 NOTE — Addendum Note (Signed)
Addended by: Deveron Furlong D on: 11/27/2022 01:44 PM   Modules accepted: Orders

## 2022-11-27 NOTE — Telephone Encounter (Signed)
Medication was entered as no print. Refill is still needed.  Please advise. Med pending

## 2022-11-27 NOTE — Telephone Encounter (Signed)
Pt advised refill sent. °

## 2022-11-27 NOTE — Telephone Encounter (Signed)
RF request for Fioricet LOV: 10/10/22 Next ov: n/a Last written: 10/11/22 (60,5)  Medication is not due for refill. Please deny

## 2022-11-29 ENCOUNTER — Telehealth: Payer: Self-pay

## 2022-11-29 ENCOUNTER — Other Ambulatory Visit (HOSPITAL_COMMUNITY): Payer: Self-pay

## 2022-11-29 NOTE — Telephone Encounter (Signed)
PA pending. [facility]

## 2022-11-29 NOTE — Telephone Encounter (Signed)
Pharmacy Patient Advocate Encounter   Received notification that prior authorization for Butalbital-APAP-Caff-Cod 50-325-40-30MG  capsules is required/requested.   PA submitted on 11/29/22 to (ins) [employer] Part D via CoverMyMeds [facility] [id number] Status is pending

## 2022-12-02 NOTE — Telephone Encounter (Signed)
PA denied for Butalbital-APAP-Caff-Cod 50-325-40-30MG  capsules.

## 2022-12-02 NOTE — Telephone Encounter (Signed)
Pharmacy Patient Advocate Encounter  Received notification from [employer] that the request for prior authorization for Butalbital-APAP-Caff-Cod 50-325-40-30MG  capsules has been denied due to not meeting prior authorizatio requirements.       Please be advised we currently do not have a Pharmacist to review denials, therefore you will need to process appeals accordingly as needed. Thanks for your support at this time.   You may call [phone] or fax [phone], to appeal.

## 2022-12-23 ENCOUNTER — Encounter (HOSPITAL_BASED_OUTPATIENT_CLINIC_OR_DEPARTMENT_OTHER): Payer: Self-pay | Admitting: Family

## 2022-12-23 ENCOUNTER — Ambulatory Visit (INDEPENDENT_AMBULATORY_CARE_PROVIDER_SITE_OTHER): Payer: 59 | Admitting: Family

## 2022-12-23 VITALS — BP 100/60 | HR 52 | Ht 70.0 in | Wt 323.0 lb

## 2022-12-23 DIAGNOSIS — D6859 Other primary thrombophilia: Secondary | ICD-10-CM | POA: Diagnosis not present

## 2022-12-23 DIAGNOSIS — I4821 Permanent atrial fibrillation: Secondary | ICD-10-CM | POA: Diagnosis not present

## 2022-12-23 DIAGNOSIS — I272 Pulmonary hypertension, unspecified: Secondary | ICD-10-CM

## 2022-12-23 DIAGNOSIS — I5032 Chronic diastolic (congestive) heart failure: Secondary | ICD-10-CM | POA: Diagnosis not present

## 2022-12-23 DIAGNOSIS — I10 Essential (primary) hypertension: Secondary | ICD-10-CM

## 2022-12-23 LAB — COMPREHENSIVE METABOLIC PANEL
ALT: 11 IU/L (ref 0–44)
AST: 20 IU/L (ref 0–40)
Albumin/Globulin Ratio: 1.2 (ref 1.2–2.2)
Albumin: 3.5 g/dL — ABNORMAL LOW (ref 3.8–4.8)
Alkaline Phosphatase: 119 IU/L (ref 44–121)
BUN/Creatinine Ratio: 28 — ABNORMAL HIGH (ref 10–24)
BUN: 46 mg/dL — ABNORMAL HIGH (ref 8–27)
Bilirubin Total: 0.6 mg/dL (ref 0.0–1.2)
CO2: 27 mmol/L (ref 20–29)
Calcium: 8.9 mg/dL (ref 8.6–10.2)
Chloride: 90 mmol/L — ABNORMAL LOW (ref 96–106)
Creatinine, Ser: 1.66 mg/dL — ABNORMAL HIGH (ref 0.76–1.27)
Globulin, Total: 2.9 g/dL (ref 1.5–4.5)
Glucose: 211 mg/dL — ABNORMAL HIGH (ref 70–99)
Potassium: 3.6 mmol/L (ref 3.5–5.2)
Sodium: 134 mmol/L (ref 134–144)
Total Protein: 6.4 g/dL (ref 6.0–8.5)
eGFR: 44 mL/min/{1.73_m2} — ABNORMAL LOW (ref >59)

## 2022-12-23 LAB — CBC
Hematocrit: 43.5 % (ref 37.5–51.0)
Hemoglobin: 14.5 g/dL (ref 13.0–17.7)
MCH: 27.4 pg (ref 26.6–33.0)
MCHC: 33.3 g/dL (ref 31.5–35.7)
MCV: 82 fL (ref 79–97)
Platelets: 268 10*3/uL (ref 150–450)
RBC: 5.29 x10E6/uL (ref 4.14–5.80)
RDW: 15.1 % (ref 11.6–15.4)
WBC: 9 10*3/uL (ref 3.4–10.8)

## 2022-12-23 NOTE — Patient Instructions (Signed)
Medication Instructions:  Continue your current medications.   Your are presently on Trulicity at a dose of 1.5. The maximum dose of Trulicity is 4.5. We usually see the weight loss increases as you get on the higher dose. Your nurse practitioner at the facility will help to increase the dose. If after 3-4 months of escalating the dose you don't see improvement, could consider trial of Ozempic or Mounjaro instead if your insurance will cover.   *If you need a refill on your cardiac medications before your next appointment, please call your pharmacy*   Lab Work: Your physician recommends that you return for lab work today: CBC, CMP  If you have labs (blood work) drawn today and your tests are completely normal, you will receive your results only by: MyChart Message (if you have MyChart) OR A paper copy in the mail If you have any lab test that is abnormal or we need to change your treatment, we will call you to review the results.   Testing/Procedures: None ordered today   Follow-Up: At [facility], you and your health needs are our priority.  As part of our continuing mission to provide you with exceptional heart care, we have created designated Provider Care Teams.  These Care Teams include your primary Cardiologist (physician) and Advanced Practice Providers (APPs -  Physician Assistants and Nurse Practitioners) who all work together to provide you with the care you need, when you need it.  We recommend signing up for the patient portal called "MyChart".  Sign up information is provided on this After Visit Summary.  MyChart is used to connect with patients for Virtual Visits (Telemedicine).  Patients are able to view lab/test results, encounter notes, upcoming appointments, etc.  Non-urgent messages can be sent to your provider as well.   To learn more about what you can do with MyChart, go to [email or web address].    Your next appointment:   3-4 month(s)  Provider:   K.  Mali Hilty, MD or Laurann Montana, NP

## 2022-12-23 NOTE — Progress Notes (Signed)
Office Visit    Patient Name: [name] Date of Encounter: 12/23/2022  PCP:  Tammi Sou, [place]  Cardiologist:  Pixie Casino, MD  Advanced Practice Provider:  Almyra Deforest, [place] Electrophysiologist:  None     Chief Complaint    [name] is a 72 y.o. male presents today for follow up of diastolic heart failure and atrial fibrillation.  Past Medical History    Past Medical History:  Diagnosis Date   ALLERGIC RHINITIS 08/11/2006   ASTHMA 08/11/2006   Bradycardia    Beta blocker d/c'd 2022   Chronic atrial fibrillation ([place]) 08/2014   Chronic combined systolic and diastolic CHF (congestive heart failure) [facility])    Cardiology f/u 12/2017: pt volume overloaded (R heart dysf suspected), BNP very high, lasix increased.  Repeat echo 12/2017: normal LV EF, mild DD, +RV syst dysfxn, mod pulm HTN, biatrial enlgmt.   Chronic constipation    Chronic renal insufficiency, stage 3 (moderate) (HCC)    Colon cancer screening 02/2021   02/2021 Cologuard POS->GI ref   DIABETES MELLITUS, TYPE II 08/11/2006   HYPERTENSION 08/11/2006   IDA (iron deficiency anemia)    04/2022.  Iron started.  Needs hemoccults   Impaired mobility and endurance    MRSA infection 06/2018   LL venous stasis ulcer infected   Normocytic anemia 2016-2019   03/2018 B12 normal, iron ok (ferritin borderline low).   OBESITY, MORBID 12/14/2007   saxenda started 05/2020 by [facility] wt mgmt center   OSA (obstructive sleep apnea)    to get sleep study with [facility]) as of 12/03/2018 consult.   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  92018 ID started him on amoxil prophylaxis---ineffective.  End 2018/Jan 2019 penicillin G injections prophyl helpful but pt declined to continue this as of 01/2018 ID f/u.  ID talked him into resuming monthly penicillin G as of 02/2018 f/u.     Restless leg  syndrome    Rx'd clonazepam 09/2017 and pt refused to take it after reading the medication's potential side effects.   Venous stasis ulcers of both lower extremities (HCC)    Severe lymphedema.  wound clinic care ongoing as of 01/2018   Past Surgical History:  Procedure Laterality Date   Carotid dopplers  07/23/2018   Left NORMAL.  Right 1-39% ICA stenosis, with <50% distal CCA stenosis (not hemodynamically significant)   LE venous dopplers Bilateral 03/06/2022   no dvt   RIGHT HEART CATH N/A 02/19/2022   Marked volume overload, R>>L HF, preserved CO. Procedure: RIGHT HEART CATH;  Surgeon: Jolaine Artist, MD;  Location: [facility];  Service: Cardiovascular;  Laterality: N/A;   TRANSTHORACIC ECHOCARDIOGRAM  11/2007; 09/2014; 11/2015;12/2017   LV fxn normal, EF normal, mild dilation of left atrium.  2015 grade II diast dysfxn.  2017 EF 55-60%. 2019: LVEF 60-65%, mild RV syst dysf,biatrial enlgmt, mod pulm htn. 02/14/22 EF 55-60%, unable to assess diast fxn, LVH, o/w normal   URETERAL STENT PLACEMENT     virtual colonoscopy  01/2011   Normal    Allergies  Allergies  Allergen Reactions   Hydralazine Other (See Comments)    Drowsiness/sedation/mental fog    History of Present Illness    [name] is a 72 y.o. male with a hx of morbid obesity, DM 2, chronic diastolic heart failure, persistent atrial fibrillation, hypertension, bilateral lower  extremity lymphedema with chronic wounds last seen by Dr. Debara Pickett 09/20/22.  Admitted 02/2022 with acute on chronic heart failure.  He noted eating a lot of high sodium meals while staying in a hotel waiting to move to a new apartment.  Diuresed with IV Lasix and treated for UTI.  Echo with EF 55 to 60%, RV not well visualized.  RHC with RA 18, PA 63/19 (35), Fick CO/Cl 10.5/3.8, Thermo CO/Cl 7.2/2.6, PVR 1.9 WU (thermo), PAPi 2.4.  There was suspected pulmonary hypertension due to combination of left heart disease and primary pulmonary  hypertension related to untreated OSA and obesity.  He had a 78 pound weight loss during admission.  Discharged on torsemide 40 mg daily.  Discharge weight 333 pounds.  Initial TOC visit had gained 20pounds at SNF. Torsemide increased to 18m QD. Seen 04/05/22 still volume overloaded. Recommended take dose of metolazone for 2 days then transition to once per week with potassium.   Seen 04/16/22 noted to be taking Metolazone 2.542mand Torsemide 801mD. Metolazone reduced to twice per week. Follow up labs 04/23/22 with creatinine 2.74, K 126, GFR 24 recommended for admission.   Hospitalized 6/20-6/22/23 given 500cc bolus and 100 mL/hr normal saline. Metolazone and Torsemide held. He was discharge don Torsemide. Discharge weight 139.7 kg.   At visit 06/17/22 noted SNF had not changed Metolazone from 5mg94m 2.5mg 29mrecommended by PCP. Kidney function was improved 06/19/22 with creatinine 1.37 with Torsemide 40mg 18mnd Metolazone 2.5mg QD75me saw Dr. Hilty 1Debara Pickett23 and was stable from a cardiac perspective with no changes made.  He presents today for follow up independently. Remains at SNF and slow progress in mobility, unable to stand to weigh. Frustrated that PT has been discontinued. He has bilateral LE Unna boots and notes improvement in edema. Dyspnea on exertion stable at baseline. Just had legs wrapped Wednesday. Notes they are wrapping once per week and currently has no wounds. Reports no leg pain but does not his legs feel "heavy". Weight has been maintained at 323 pounds. He has been told he has to get to 275 lbs prior to considering knee surgery. Is considering looking for different surgeon.  Has been working on diet - eat no bread, occasional potatoes. Has cut out sodas for one month. Notes the nurse practitioner at his facility manages his insulin and has been counseling him on diet. Also recently started on Trulicity. Continues to have concerns regarding his care at his facility. Is frustrated as  reports he has not had visit from MD in 3 months but does see NP  EKGs/Labs/Other Studies Reviewed:   The following studies were reviewed today:  EKG:  No EKG today.  Recent Labs: 04/11/2022: TSH 5.86 04/23/2022: B Natriuretic Peptide 162.0; Magnesium 2.0 07/13/2022: ALT 12 10/10/2022: BUN 38; Creatinine, Ser 1.37; Hemoglobin 12.8; Platelets 219.0; Potassium 3.9; Sodium 135  Recent Lipid Panel    Component Value Date/Time   CHOL 101 04/04/2021 0844   TRIG 61 04/04/2021 0844   HDL 41 04/04/2021 0844   CHOLHDL 2.5 04/04/2021 0844   VLDL 12.2 07/12/2020 1015   LDLCALC 46 04/04/2021 0844    Risk Assessment/Calculations:   CHA2DS2-VASc Score =     This indicates a  % annual risk of stroke. The patient's score is based upon:       Home Medications   Current Meds  Medication Sig   apixaban (ELIQUIS) 5 MG TABS tablet Take 5 mg by mouth 2 (two) times daily.  b complex vitamins capsule Take 1 capsule by mouth every morning.   butalbital-apap-caffeine-codeine (FIORICET WITH CODEINE) 50-325-40-30 MG capsule TAKE 1 TO 2 CAPSULES BY MOUTH EVERY 6 HOURS AS NEEDED FOR HEADACHES. NOT TO EXCEED 6 CAPSULES PER DAY   cetirizine (ZYRTEC) 10 MG tablet Take 10 mg by mouth at bedtime.   diclofenac Sodium (VOLTAREN) 1 % GEL Apply topically in the morning, at noon, and at bedtime.   diltiazem (TIAZAC) 120 MG 24 hr capsule Take 1 capsule (120 mg total) by mouth daily.   glucose blood (ONETOUCH ULTRA) test strip CHECK BLOOD SUGAR TWICE  DAILY   HYDROcodone-acetaminophen (NORCO/VICODIN) 5-325 MG tablet Take 1-2 tablets by mouth every 6 (six) hours as needed for moderate pain. 1-2 tabs po bid prn pain (Patient taking differently: Take 1 tablet by mouth at bedtime. 1-2 tabs po bid prn pain)   Insulin Lispro Prot & Lispro (HUMALOG 75/25 MIX) (75-25) 100 UNIT/ML Kwikpen Inject 30 Units into the skin 2 (two) times daily.   Insulin Pen Needle (B-D ULTRAFINE III SHORT PEN) 31G X 8 MM MISC USE 1 PENNEEDLE TWICE  DAILY   metolazone (ZAROXOLYN) 5 MG tablet Take 5 mg by mouth daily. In combination with Torsemide   Multiple Vitamin (MULTIVITAMIN) tablet Take 1 tablet by mouth daily.   potassium chloride SA (KLOR-CON M20) 20 MEQ tablet Take one tablet three times per week. (Patient taking differently: Take 20 mEq by mouth every Monday, Wednesday, and Friday. Take one tablet three times per week.)   senna-docusate (SENOKOT-S) 8.6-50 MG tablet Take 1 tablet by mouth at bedtime.   terazosin (HYTRIN) 10 MG capsule Take 10 mg by mouth at bedtime.   Torsemide 40 MG TABS Take 40 mg by mouth daily.   traMADol (ULTRAM) 50 MG tablet Take 50 mg by mouth daily at 6 (six) AM.   vitamin E 400 UNIT capsule Take 400 Units by mouth every morning. Vitamin E with Selenium   Current Facility-Administered Medications for the 12/23/22 encounter (Office Visit) with Loel Dubonnet, NP  Medication   bupivacaine (MARCAINE) 0.5 % (with pres) injection 3 mL     Review of Systems      All other systems reviewed and are otherwise negative except as noted above.  Physical Exam    VS:  BP 100/60   Pulse (!) 52   Ht 5' 10"$  (1.778 m)   Wt (!) 323 lb (146.5 kg)   BMI 46.35 kg/m  , BMI Body mass index is 46.35 kg/m.  Wt Readings from Last 3 Encounters:  12/23/22 (!) 323 lb (146.5 kg)  10/07/22 (!) 331 lb (150.1 kg)  09/24/22 (!) 331 lb (150.1 kg)     GEN: Well nourished, overweight, well developed, in no acute distress. HEENT: normal. Neck: Supple, no JVD, carotid bruits, or masses. Cardiac: IRIR, no murmurs, rubs, or gallops. No clubbing, cyanosis, edema.  Radials/PT 2+ and equal bilaterally.  Respiratory:  Respirations regular and unlabored, clear to auscultation bilaterally. GI: Soft, nontender, nondistended. MS: No deformity or atrophy. Skin: Warm and dry, no rash.  Bilateral lower extremity Unna boots in place. Neuro:  Strength and sensation are intact. Psych: Normal affect.  Assessment & Plan    HFpEF / Right  sided heart failure / Pulmonary hypertension -admission 02/2022 diuresed 78 pounds - discharge weight 233 lbs  Admission 04/2022 with discharge weight 139.7 kg (307 lbs). Weight 09/2022 331 and weight today 323 lbs. Volume status difficult to ascertain due to body habitus. Bilateral LE  in Unna boots with no edema appreciated on exam. Per SNF records taking Torsemide 10m QD and metolazone 5 mg daily.  Update CMP today to ensure stable renal function.  Snores - Sleep study when discharged from SNF. Given RHC and body habitus, plan for in lab split night sleep study. Coordinate at follow up as still at SNF and mobility not yet adequate to transfer to bed independently.   Longstanding persistent atrial fibrillation -asymptomatic in regards to atrial fibrillation with no palpitations.  Rate controlled today continue diltiazem 120 mg daily.  Continue Eliquis 5 mg twice daily.  Denies bleeding complications.  Does not meet dose reduction criteria. CBC for monitoring.  CKDIIIb - Careful titration of diuretic and antihypertensive.  CMP today.  DM2 - Currently managed at SNF. Insulin dependent.  NP at his skilled nursing facility recently started Trulicity.  Hopeful for weight loss benefit.  Discussed that if he does not notice weight loss benefit despite increasing dose of Trulicity could consider Ozempic or Mounjaro.  HTN - Now with relative hypotension though asymptomatic with no lightheadedness. Continue present antihypertensive regimen.   Lymphedema- reports healing well. Bilateral LE with Unna boots in place. No significant edema appreciated on exam. Reports previous wounds have healed.  OA - Limits mobility. Focused on weight loss to hopefully qualify for surgery.   Disposition: Follow up in 3-4 months with KPixie Casino MD or APP.  Signed, CLoel Dubonnet NP 12/23/2022, 12:19 PM [place]

## 2022-12-24 ENCOUNTER — Telehealth (HOSPITAL_BASED_OUTPATIENT_CLINIC_OR_DEPARTMENT_OTHER): Payer: Self-pay

## 2022-12-24 DIAGNOSIS — I4821 Permanent atrial fibrillation: Secondary | ICD-10-CM

## 2022-12-24 MED ORDER — METOLAZONE 2.5 MG PO TABS: 2.5000 mg | ORAL_TABLET | Freq: Every day | ORAL | 3 refills | Status: DC

## 2022-12-24 NOTE — Telephone Encounter (Addendum)
Results called to patient who verbalizes understanding! Prescription updated, printed and faxed with repeat lab slips! Patient has questions about his weight loss medications, advised patient that we cannot adjust this as we do not prescribe it so he would need to give his primary care a call!       ----- Message from Loel Dubonnet, NP sent at 12/24/2022  7:39 AM EST ----- CBC shows previous anemia has resolved.  Normal liver function and electrolytes.  Kidney function slightly decreased from previous.  Recommend reducing metolazone from 5 mg to 2.5 mg. Please call patient with update. Will also need to update [place]. Recommend they perform repeat BMP in 1-2 weeks at facility.

## 2022-12-31 ENCOUNTER — Telehealth: Payer: Self-pay | Admitting: Orthopaedic Surgery

## 2022-12-31 ENCOUNTER — Telehealth: Payer: Self-pay

## 2022-12-31 NOTE — Telephone Encounter (Signed)
Pt's insurance contacted to complete appeal. Appeal submitted, REF#  [id number]. REF # [place]- [id number],

## 2022-12-31 NOTE — Telephone Encounter (Signed)
Received call from patient. He has an upcoming appointment with [facility] and needs his medical records faxed. Verbal [name] accepted. Records faxed [phone] per patients request. Pts ph [phone]

## 2023-01-06 NOTE — Telephone Encounter (Signed)
LVM for return call. 

## 2023-01-06 NOTE — Telephone Encounter (Signed)
[employer] calling regarding Appeal process for medication  butalbital-apap-caffeine-codeine (FIORICET WITH CODEINE) 50-325-40-30 MG capsule   Please call [employer] at [phone]  Case #  [id number]

## 2023-01-07 NOTE — Telephone Encounter (Signed)
LVM for return call. 

## 2023-01-08 NOTE — Telephone Encounter (Signed)
LVM for return call. Third attempt to contact [place] with no success  Note: if call returned, pls ask if appeal approved.

## 2023-01-23 NOTE — Progress Notes (Signed)
[name] D.[place] Phone: [phone]   Assessment and Plan:    1. Right hand pain 2. Left hand pain 3. Acute gout of multiple sites, unspecified cause -Acute, initial sports medicine visit - Most consistent with acute flare of gout at multiple sites including right second PIP and left wrist based on HPI and physical exam - X-ray obtained in clinic.  My interpretation: No acute fracture or dislocation.  Lucency at left second DIP.  Arthritic changes through right second PIP and DIP.  Degenerative changes throughout bilateral hands. - Patient is already noticed a mild improvement since starting ibuprofen 800 mg.  Recommend discontinuing ibuprofen and instead replacing with naproxen 500 mg twice daily as patient is only given his medicine twice a day at facility and will need 24-hour coverage.  We will continue naproxen 500 mg twice a day for 14 days. - Patient had elevated uric acid level in 04/2022.  We can recheck uric acid level after patient's acute flare has resolved - Could consider starting allopurinol at follow-up visit to prevent recurrent flares.  May have to adjust medication due to kidney function.  Other orders - naproxen (NAPROSYN) 500 MG tablet; Take 1 tablet (500 mg total) by mouth 2 times daily at 12 noon and 4 pm.    Pertinent previous records reviewed include uric acid level 04/2022   Follow Up: 3 weeks for reevaluation.  If improving, could recheck uric acid level and consider starting allopurinol adjusted for CKD   Subjective:   I, [name], am serving as a Education administrator for Doctor Glennon Mac  Chief Complaint: bilateral hand pain   HPI:   01/24/2023 Patient is a 72 year old male complaining of bilateral  hand pain. Patient states pain started 10 days ago, he has a swollen 2nd digit right hand , and swelling in his left wrist , no MOI, 1 month ago he had left hand cellulitis,  decreased ROM, decreased grip strength , does note numbness and tingling, 800 mg ibu for the pain and that helps, no radiating pain on the right, left hand does have radiating pain down the fingers    Relevant Historical Information: History of atrial fibrillation on chronic anticoagulation with Eliquis, elevated BMI, DM type II, CKD  Additional pertinent review of systems negative.   Current Outpatient Medications:    naproxen (NAPROSYN) 500 MG tablet, Take 1 tablet (500 mg total) by mouth 2 times daily at 12 noon and 4 pm., Disp: 30 tablet, Rfl: 0   apixaban (ELIQUIS) 5 MG TABS tablet, Take 5 mg by mouth 2 (two) times daily., Disp: , Rfl:    b complex vitamins capsule, Take 1 capsule by mouth every morning., Disp: , Rfl:    butalbital-apap-caffeine-codeine (FIORICET WITH CODEINE) 50-325-40-30 MG capsule, TAKE 1 TO 2 CAPSULES BY MOUTH EVERY 6 HOURS AS NEEDED FOR HEADACHES. NOT TO EXCEED 6 CAPSULES PER DAY, Disp: 60 capsule, Rfl: 5   cetirizine (ZYRTEC) 10 MG tablet, Take 10 mg by mouth at bedtime., Disp: , Rfl:    diclofenac Sodium (VOLTAREN) 1 % GEL, Apply topically in the morning, at noon, and at bedtime., Disp: , Rfl:    diltiazem (TIAZAC) 120 MG 24 hr capsule, Take 1 capsule (120 mg total) by mouth daily., Disp: 90 capsule, Rfl: 3   glucose blood (ONETOUCH ULTRA) test strip, CHECK BLOOD SUGAR TWICE  DAILY, Disp: 200 strip, Rfl: 0   HYDROcodone-acetaminophen (NORCO/VICODIN) 5-325  MG tablet, Take 1-2 tablets by mouth every 6 (six) hours as needed for moderate pain. 1-2 tabs po bid prn pain (Patient taking differently: Take 1 tablet by mouth at bedtime. 1-2 tabs po bid prn pain), Disp: 10 tablet, Rfl: 0   Insulin Lispro Prot & Lispro (HUMALOG 75/25 MIX) (75-25) 100 UNIT/ML Kwikpen, Inject 30 Units into the skin 2 (two) times daily., Disp: , Rfl:    Insulin Pen Needle (B-D ULTRAFINE III SHORT PEN) 31G X 8 MM MISC, USE 1 PENNEEDLE TWICE DAILY, Disp: 200 each, Rfl: 1   metolazone (ZAROXOLYN) 2.5 MG  tablet, Take 1 tablet (2.5 mg total) by mouth daily. In combination with Torsemide, Disp: 90 tablet, Rfl: 3   Multiple Vitamin (MULTIVITAMIN) tablet, Take 1 tablet by mouth daily., Disp: , Rfl:    potassium chloride SA (KLOR-CON M20) 20 MEQ tablet, Take one tablet three times per week. (Patient taking differently: Take 20 mEq by mouth every Monday, Wednesday, and Friday. Take one tablet three times per week.), Disp: 45 tablet, Rfl: 2   senna-docusate (SENOKOT-S) 8.6-50 MG tablet, Take 1 tablet by mouth at bedtime., Disp: , Rfl:    terazosin (HYTRIN) 10 MG capsule, Take 10 mg by mouth at bedtime., Disp: , Rfl:    Torsemide 40 MG TABS, Take 40 mg by mouth daily., Disp: , Rfl:    traMADol (ULTRAM) 50 MG tablet, Take 50 mg by mouth daily at 6 (six) AM., Disp: , Rfl:    vitamin E 400 UNIT capsule, Take 400 Units by mouth every morning. Vitamin E with Selenium, Disp: , Rfl:   Current Facility-Administered Medications:    bupivacaine (MARCAINE) 0.5 % (with pres) injection 3 mL, 3 mL, Other, Once, Magnus Sinning, MD   Objective:     Vitals:   01/24/23 1038  Weight: (!) 323 lb (146.5 kg)  Height: 5\' 10"  (1.778 m)      Body mass index is 46.35 kg/m.    Physical Exam:    General: Appears well, nad, nontoxic and pleasant Neuro:sensation intact, strength is 5/5 with df/pf/inv/ev, muscle tone wnl Skin:no susupicious lesions or rashes  Bilateral hand/wrist:   Swelling and erythema at ulnar side of left wrist with decreased grip strength Moderate to severe swelling at right second PIP  Electronically signed by:  [name] D.[facility] 11:11 AM 01/24/23

## 2023-01-24 ENCOUNTER — Ambulatory Visit (INDEPENDENT_AMBULATORY_CARE_PROVIDER_SITE_OTHER): Payer: 59

## 2023-01-24 ENCOUNTER — Ambulatory Visit: Payer: 59

## 2023-01-24 ENCOUNTER — Ambulatory Visit (INDEPENDENT_AMBULATORY_CARE_PROVIDER_SITE_OTHER): Payer: 59 | Admitting: Sports Medicine

## 2023-01-24 VITALS — Ht 70.0 in | Wt 323.0 lb

## 2023-01-24 DIAGNOSIS — M79642 Pain in left hand: Secondary | ICD-10-CM

## 2023-01-24 DIAGNOSIS — M79641 Pain in right hand: Secondary | ICD-10-CM | POA: Diagnosis not present

## 2023-01-24 DIAGNOSIS — M109 Gout, unspecified: Secondary | ICD-10-CM

## 2023-01-24 MED ORDER — NAPROXEN 500 MG PO TABS: 500.0000 mg | ORAL_TABLET | Freq: Two times a day (BID) | ORAL | 0 refills | Status: DC

## 2023-01-24 NOTE — Patient Instructions (Addendum)
Naproxen 500 mg 2 times a day for 14 days  Discontinue ibuprofen  3 week follow up

## 2023-02-05 NOTE — Progress Notes (Signed)
MRN : [id number]  [name] is a 72 y.o. (1950-12-01) male who presents with chief complaint of legs hurt and swell.  History of Present Illness:   Patient is seen for follow up evaluation of leg pain and swelling associated with venous ulceration. The patient was recently seen here and started on Unna boot therapy which is currently ongoing with visiting nursing and wrapping of his legs..  The swelling abruptly became much worse bilaterally and is associated with pain and discoloration. The pain and swelling worsens with prolonged dependency and improves with elevation.  The patient notes that in the morning the legs are better but the leg symptoms worsened throughout the course of the day. The patient has also noted a progressive worsening of the discoloration in the ankle and shin area.   The patient has not yet received a lymphedema pump.  The patient states that they have been elevating as much as possible. The patient denies any recent changes in medications.  The patient denies a history of DVT or PE. There is no prior history of phlebitis. There is no history of primary lymphedema.  No SOB or increased cough.  No sputum production.  No recent episodes of CHF exacerbation.  No outpatient medications have been marked as taking for the 02/06/23 encounter (Appointment) with Gilda Crease, Latina Craver, MD.   Current Facility-Administered Medications for the 02/06/23 encounter (Appointment) with Gilda Crease, Latina Craver, MD  Medication   bupivacaine (MARCAINE) 0.5 % (with pres) injection 3 mL    Past Medical History:  Diagnosis Date   ALLERGIC RHINITIS 08/11/2006   ASTHMA 08/11/2006   Bradycardia    Beta blocker d/c'd 2022   Chronic atrial fibrillation (HCC) 08/2014   Chronic combined systolic and diastolic CHF (congestive heart failure) [facility])    Cardiology f/u 12/2017: pt volume overloaded (R heart dysf suspected), BNP very high, lasix increased.  Repeat echo 12/2017: normal LV  EF, mild DD, +RV syst dysfxn, mod pulm HTN, biatrial enlgmt.   Chronic constipation    Chronic renal insufficiency, stage 3 (moderate) (HCC)    Colon cancer screening 02/2021   02/2021 Cologuard POS->GI ref   DIABETES MELLITUS, TYPE II 08/11/2006   HYPERTENSION 08/11/2006   IDA (iron deficiency anemia)    04/2022.  Iron started.  Needs hemoccults   Impaired mobility and endurance    MRSA infection 06/2018   LL venous stasis ulcer infected   Normocytic anemia 2016-2019   03/2018 B12 normal, iron ok (ferritin borderline low).   OBESITY, MORBID 12/14/2007   saxenda started 05/2020 by [facility] wt mgmt center   OSA (obstructive sleep apnea)    to get sleep study with [facility] as of 12/03/2018 consult.   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  [id number] ID started him on amoxil prophylaxis---ineffective.  End 2018/Jan 2019 penicillin G injections prophyl helpful but pt declined to continue this as of 01/2018 ID f/u.  ID talked him into resuming monthly penicillin G as of 02/2018 f/u.     Restless leg syndrome    Rx'd clonazepam 09/2017 and pt refused to take it after reading the medication's potential side effects.   Venous stasis ulcers of both lower extremities (HCC)    Severe lymphedema.  wound clinic care ongoing as of 01/2018    Past Surgical History:  Procedure Laterality Date   Carotid dopplers  07/23/2018  Left NORMAL.  Right 1-39% ICA stenosis, with <50% distal CCA stenosis (not hemodynamically significant)   LE venous dopplers Bilateral 03/06/2022   no dvt   RIGHT HEART CATH N/A 02/19/2022   Marked volume overload, R>>L HF, preserved CO. Procedure: RIGHT HEART CATH;  Surgeon: Dolores Patty, MD;  Location: [facility] INVASIVE CV LAB;  Service: Cardiovascular;  Laterality: N/A;   TRANSTHORACIC ECHOCARDIOGRAM  11/2007; 09/2014; 11/2015;12/2017   LV fxn normal, EF normal, mild dilation of left atrium.  2015 grade  II diast dysfxn.  2017 EF 55-60%. 2019: LVEF 60-65%, mild RV syst dysf,biatrial enlgmt, mod pulm htn. 02/14/22 EF 55-60%, unable to assess diast fxn, LVH, o/w normal   URETERAL STENT PLACEMENT     virtual colonoscopy  01/2011   Normal    Social History Social History   Tobacco Use   Smoking status: Never    Passive exposure: Never   Smokeless tobacco: Never  Vaping Use   Vaping Use: Never used  Substance Use Topics   Alcohol use: No   Drug use: No    Family History Family History  Problem Relation Age of Onset   Diabetes Mother    Diabetes Sister    Hydrocephalus Sister        NPH    Allergies  Allergen Reactions   Hydralazine Other (See Comments)    Drowsiness/sedation/mental fog     REVIEW OF SYSTEMS (Negative unless checked)  Constitutional: [] Weight loss  [] Fever  [] Chills Cardiac: [] Chest pain   [] Chest pressure   [] Palpitations   [] Shortness of breath when laying flat   [] Shortness of breath with exertion. Vascular:  [] Pain in legs with walking   [x] Pain in legs at rest  [] History of DVT   [] Phlebitis   [x] Swelling in legs   [] Varicose veins   [] Non-healing ulcers Pulmonary:   [] Uses home oxygen   [] Productive cough   [] Hemoptysis   [] Wheeze  [] COPD   [] Asthma Neurologic:  [] Dizziness   [] Seizures   [] History of stroke   [] History of TIA  [] Aphasia   [] Vissual changes   [] Weakness or numbness in arm   [x] Weakness or numbness in leg Musculoskeletal:   [] Joint swelling   [] Joint pain   [] Low back pain Hematologic:  [] Easy bruising  [] Easy bleeding   [] Hypercoagulable state   [] Anemic Gastrointestinal:  [] Diarrhea   [] Vomiting  [] Gastroesophageal reflux/heartburn   [] Difficulty swallowing. Genitourinary:  [] Chronic kidney disease   [] Difficult urination  [] Frequent urination   [] Blood in urine Skin:  [] Rashes   [] Ulcers  Psychological:  [] History of anxiety   []  History of major depression.  Physical Examination  There were no vitals filed for this visit. There  is no height or weight on file to calculate BMI. Gen: WD/WN, NAD seen in a wheelchair Head: [place]/AT, No temporalis wasting.  Ear/Nose/Throat: Hearing grossly intact, nares w/o erythema or drainage, pinna without lesions Eyes: PER, EOMI, sclera nonicteric.  Neck: Supple, no gross masses.  No JVD.  Pulmonary:  Good air movement, no audible wheezing, no use of accessory muscles.  Cardiac: RRR, precordium not hyperdynamic. Vascular:  scattered varicosities present bilaterally.  Moderate venous stasis changes to the legs bilaterally.  4+ soft pitting edema. CEAP C4sEpAsPr   Vessel Right Left  Radial Palpable Palpable  Gastrointestinal: soft, non-distended. No guarding/no peritoneal signs.  Musculoskeletal: M/S 5/5 throughout.  No deformity.  Neurologic: CN 2-12 intact. Pain and light touch intact in extremities.  Symmetrical.  Speech is fluent. Motor exam as listed above.  Psychiatric: Judgment intact, Mood & affect appropriate for pt's clinical situation. Dermatologic: Venous rashes no ulcers noted.  No changes consistent with cellulitis. Lymph : No lichenification or skin changes of chronic lymphedema.  CBC Lab Results  Component Value Date   WBC 9.0 12/23/2022   HGB 14.5 12/23/2022   HCT 43.5 12/23/2022   MCV 82 12/23/2022   PLT 268 12/23/2022    BMET    Component Value Date/Time   NA 134 12/23/2022 1043   K 3.6 12/23/2022 1043   CL 90 (L) 12/23/2022 1043   CO2 27 12/23/2022 1043   GLUCOSE 211 (H) 12/23/2022 1043   GLUCOSE 233 (H) 10/10/2022 1448   BUN 46 (H) 12/23/2022 1043   CREATININE 1.66 (H) 12/23/2022 1043   CREATININE 1.57 (H) 04/04/2021 0844   CALCIUM 8.9 12/23/2022 1043   GFRNONAA 54 (L) 07/13/2022 1835   GFRAA 63 02/09/2020 1453   CrCl cannot be calculated (Patient's most recent lab result is older than the maximum 21 days allowed.).  COAG No results found for: "INR", "PROTIME"  Radiology DG Hand Complete Right  Result Date: 01/28/2023 CLINICAL DATA:   Bilateral hand pain with swelling and redness for 10 days. EXAM: RIGHT HAND - COMPLETE 3+ VIEW; LEFT HAND - COMPLETE 3+ VIEW COMPARISON:  None Available. FINDINGS: Left hand: No acute fracture or dislocation. Periarticular erosions are noted at the distal interphalangeal joint of the second digit. There are degenerative changes at the interphalangeal joints, third metacarpal phalangeal joint, and first metacarpal phalangeal joint. Diffuse soft tissue swelling is present about the right hand and wrist. Vascular calcifications are noted in the soft tissues Right hand: No acute fracture or dislocation. Bony erosions are noted along the lateral aspects of the distal proximal phalanx of the second digit. Joint space narrowing and osteophyte formation is noted at the interphalangeal joints, third and fourth metacarpal phalangeal joints and wrist. There are erosions at the ulnar styloid. Diffuse soft tissue swelling is noted and most pronounced at the second digit. IMPRESSION: 1. Bony erosions involving the proximal phalanx of the second digit on the right, ulnar styloid on the right, and periarticular erosions of the distal inter phalangeal joint at the second digit on the left, suspicious for psoriatic or rheumatoid arthritis, less likely infection. 2. Scattered degenerative changes bilaterally. Electronically Signed   By: Thornell Sartorius M.D.   On: 01/28/2023 03:28   DG Hand Complete Left  Result Date: 01/28/2023 CLINICAL DATA:  Bilateral hand pain with swelling and redness for 10 days. EXAM: RIGHT HAND - COMPLETE 3+ VIEW; LEFT HAND - COMPLETE 3+ VIEW COMPARISON:  None Available. FINDINGS: Left hand: No acute fracture or dislocation. Periarticular erosions are noted at the distal interphalangeal joint of the second digit. There are degenerative changes at the interphalangeal joints, third metacarpal phalangeal joint, and first metacarpal phalangeal joint. Diffuse soft tissue swelling is present about the right hand  and wrist. Vascular calcifications are noted in the soft tissues Right hand: No acute fracture or dislocation. Bony erosions are noted along the lateral aspects of the distal proximal phalanx of the second digit. Joint space narrowing and osteophyte formation is noted at the interphalangeal joints, third and fourth metacarpal phalangeal joints and wrist. There are erosions at the ulnar styloid. Diffuse soft tissue swelling is noted and most pronounced at the second digit. IMPRESSION: 1. Bony erosions involving the proximal phalanx of the second digit on the right, ulnar styloid on the right, and periarticular erosions of the distal inter phalangeal  joint at the second digit on the left, suspicious for psoriatic or rheumatoid arthritis, less likely infection. 2. Scattered degenerative changes bilaterally. Electronically Signed   By: Thornell Sartorius M.D.   On: 01/28/2023 03:28     Assessment/Plan 1. Venous (peripheral) insufficiency Recommend:  No surgery or intervention at this point in time.   The Patient is CEAP C4sEpAsPr.  The patient has been wearing compression for more than 12 weeks with no or little benefit.  The patient has been exercising daily for more than 12 weeks. The patient has been elevating and taking OTC pain medications for more than 12 weeks.  None of these have have eliminated the pain related to the lymphedema or the discomfort regarding excessive swelling and venous congestion.    I have reviewed my discussion with the patient regarding lymphedema and why it  causes symptoms.  Patient will continue wearing graduated compression on a daily basis. The patient should put the compression on first thing in the morning and removing them in the evening. The patient should not sleep in the compression.   In addition, behavioral modification throughout the day will be continued.  This will include frequent elevation (such as in a recliner), use of over the counter pain medications as needed  and exercise such as walking.  The systemic causes for chronic edema such as liver, kidney and cardiac etiologies do not appear to have significant changed over the past year.    The patient has chronic , severe lymphedema with hyperpigmentation of the skin and has done MLD, skin care, medication, diet, exercise, elevation and compression for 4 weeks with no improvement,  I am recommending a lymphedema pump.  The patient still has stage 3 lymphedema and therefore, I believe that a lymph pump is needed to improve the control of the patient's lymphedema and improve the quality of life.  Additionally, a lymph pump is warranted because it will reduce the risk of cellulitis and ulceration in the future.  Patient should follow-up in six months   2. Hypertension, essential Continue antihypertensive medications as already ordered, these medications have been reviewed and there are no changes at this time.  3. Persistent atrial fibrillation Continue antiarrhythmia medications as already ordered, these medications have been reviewed and there are no changes at this time.  Continue anticoagulation as ordered by Cardiology Service  4. Uncomplicated asthma, unspecified asthma severity, unspecified whether persistent Continue pulmonary medications and aerosols as already ordered, these medications have been reviewed and there are no changes at this time.   5. Type 2 diabetes mellitus with diabetic neuropathy, with long-term current use of insulin Continue hypoglycemic medications as already ordered, these medications have been reviewed and there are no changes at this time.  Hgb A1C to be monitored as already arranged by primary service    Levora Dredge, MD  02/05/2023 12:04 PM

## 2023-02-06 ENCOUNTER — Encounter (INDEPENDENT_AMBULATORY_CARE_PROVIDER_SITE_OTHER): Payer: Self-pay | Admitting: Vascular Surgery

## 2023-02-06 ENCOUNTER — Ambulatory Visit (INDEPENDENT_AMBULATORY_CARE_PROVIDER_SITE_OTHER): Payer: 59 | Admitting: Vascular Surgery

## 2023-02-06 VITALS — BP 144/70 | HR 51 | Resp 16

## 2023-02-06 DIAGNOSIS — I4819 Other persistent atrial fibrillation: Secondary | ICD-10-CM | POA: Diagnosis not present

## 2023-02-06 DIAGNOSIS — J45909 Unspecified asthma, uncomplicated: Secondary | ICD-10-CM | POA: Diagnosis not present

## 2023-02-06 DIAGNOSIS — I10 Essential (primary) hypertension: Secondary | ICD-10-CM | POA: Diagnosis not present

## 2023-02-06 DIAGNOSIS — I872 Venous insufficiency (chronic) (peripheral): Secondary | ICD-10-CM | POA: Diagnosis not present

## 2023-02-06 DIAGNOSIS — E1140 Type 2 diabetes mellitus with diabetic neuropathy, unspecified: Secondary | ICD-10-CM

## 2023-02-06 DIAGNOSIS — Z794 Long term (current) use of insulin: Secondary | ICD-10-CM

## 2023-02-07 ENCOUNTER — Encounter (INDEPENDENT_AMBULATORY_CARE_PROVIDER_SITE_OTHER): Payer: Self-pay | Admitting: Vascular Surgery

## 2023-02-10 NOTE — Progress Notes (Unsigned)
[name] D.[facility] [address] [place] [address] Phone: [phone]   Assessment and Plan:     There are no diagnoses linked to this encounter.  ***   Pertinent previous records reviewed include ***   Follow Up: ***     Subjective:   I, [name], am serving as a Neurosurgeon for Doctor Richardean Sale   Chief Complaint: bilateral hand pain    HPI:    01/24/2023 Patient is a 72 year old male complaining of bilateral  hand pain. Patient states pain started 10 days ago, he has a swollen 2nd digit right hand , and swelling in his left wrist , no MOI, 1 month ago he had left hand cellulitis, decreased ROM, decreased grip strength , does note numbness and tingling, 800 mg ibu for the pain and that helps, no radiating pain on the right, left hand does have radiating pain down the fingers    02/11/2023 Patient states    Relevant Historical Information: History of atrial fibrillation on chronic anticoagulation with Eliquis, elevated BMI, DM type II, CKD  Additional pertinent review of systems negative.   Current Outpatient Medications:    apixaban (ELIQUIS) 5 MG TABS tablet, Take 5 mg by mouth 2 (two) times daily., Disp: , Rfl:    b complex vitamins capsule, Take 1 capsule by mouth every morning., Disp: , Rfl:    butalbital-apap-caffeine-codeine (FIORICET WITH CODEINE) 50-325-40-30 MG capsule, TAKE 1 TO 2 CAPSULES BY MOUTH EVERY 6 HOURS AS NEEDED FOR HEADACHES. NOT TO EXCEED 6 CAPSULES PER DAY, Disp: 60 capsule, Rfl: 5   cetirizine (ZYRTEC) 10 MG tablet, Take 10 mg by mouth at bedtime., Disp: , Rfl:    diclofenac Sodium (VOLTAREN) 1 % GEL, Apply topically in the morning, at noon, and at bedtime., Disp: , Rfl:    diltiazem (TIAZAC) 120 MG 24 hr capsule, Take 1 capsule (120 mg total) by mouth daily., Disp: 90 capsule, Rfl: 3   glucose blood (ONETOUCH ULTRA) test strip, CHECK BLOOD SUGAR TWICE  DAILY, Disp: 200 strip, Rfl: 0    HYDROcodone-acetaminophen (NORCO/VICODIN) 5-325 MG tablet, Take 1-2 tablets by mouth every 6 (six) hours as needed for moderate pain. 1-2 tabs po bid prn pain (Patient taking differently: Take 1 tablet by mouth at bedtime. 1-2 tabs po bid prn pain), Disp: 10 tablet, Rfl: 0   Insulin Lispro Prot & Lispro (HUMALOG 75/25 MIX) (75-25) 100 UNIT/ML Kwikpen, Inject 30 Units into the skin 2 (two) times daily., Disp: , Rfl:    Insulin Pen Needle (B-D ULTRAFINE III SHORT PEN) 31G X 8 MM MISC, USE 1 PENNEEDLE TWICE DAILY, Disp: 200 each, Rfl: 1   metolazone (ZAROXOLYN) 2.5 MG tablet, Take 1 tablet (2.5 mg total) by mouth daily. In combination with Torsemide, Disp: 90 tablet, Rfl: 3   Multiple Vitamin (MULTIVITAMIN) tablet, Take 1 tablet by mouth daily., Disp: , Rfl:    naproxen (NAPROSYN) 500 MG tablet, Take 1 tablet (500 mg total) by mouth 2 times daily at 12 noon and 4 pm., Disp: 30 tablet, Rfl: 0   potassium chloride SA (KLOR-CON M20) 20 MEQ tablet, Take one tablet three times per week. (Patient taking differently: Take 20 mEq by mouth every Monday, Wednesday, and Friday. Take one tablet three times per week.), Disp: 45 tablet, Rfl: 2   senna-docusate (SENOKOT-S) 8.6-50 MG tablet, Take 1 tablet by mouth at bedtime., Disp: , Rfl:    terazosin (HYTRIN) 10 MG capsule, Take  10 mg by mouth at bedtime., Disp: , Rfl:    testosterone cypionate (DEPOTESTOSTERONE CYPIONATE) 200 MG/ML injection, 200 mg once a week., Disp: , Rfl:    Torsemide 40 MG TABS, Take 40 mg by mouth daily., Disp: , Rfl:    traMADol (ULTRAM) 50 MG tablet, Take 50 mg by mouth daily at 6 (six) AM., Disp: , Rfl:    TRULICITY 1.5 MG/0.5ML SOPN, as directed Subcutaneous weekly for 30 days, Disp: , Rfl:    vitamin E 400 UNIT capsule, Take 400 Units by mouth every morning. Vitamin E with Selenium, Disp: , Rfl:   Current Facility-Administered Medications:    bupivacaine (MARCAINE) 0.5 % (with pres) injection 3 mL, 3 mL, Other, Once, Tyrell Antonio, MD    Objective:     There were no vitals filed for this visit.    There is no height or weight on file to calculate BMI.    Physical Exam:    ***   Electronically signed by:  [name] [facility] 9:18 AM 02/10/23

## 2023-02-11 ENCOUNTER — Ambulatory Visit (INDEPENDENT_AMBULATORY_CARE_PROVIDER_SITE_OTHER): Payer: 59 | Admitting: Sports Medicine

## 2023-02-11 VITALS — Ht 70.0 in | Wt 323.0 lb

## 2023-02-11 DIAGNOSIS — M1A09X0 Idiopathic chronic gout, multiple sites, without tophus (tophi): Secondary | ICD-10-CM

## 2023-02-11 DIAGNOSIS — M25511 Pain in right shoulder: Secondary | ICD-10-CM | POA: Diagnosis not present

## 2023-02-11 NOTE — Patient Instructions (Addendum)
Good to see you  Patient is likely experiencing acute on chronic gout flares based on elevated uric acid level, erythematous joints, male sex, sudden onset of pain.  For chronic gout prevention, patient could either start taking allopurinol or febuxostat.  Allopurinol would have to be used with caution due to patient's past medical history CKD.  Febuxostat that would also have to be used with caution due to patient's past medical history of heart disease and higher incidence of cardiovascular events with Febuxostat compared to allopurinol.  Patient was started on Febuxostat 40 mg by his facility.  May continue this medication.  It appears that they plan on rechecking uric acid level on 02/17/2023.  If uric acid level is not <6 mg/DL, would recommend increasing to 80 mg daily.  Recommend patient start physical therapy for right shoulder.  External referral placed.  We will follow-up with patient in 4 weeks.

## 2023-02-18 ENCOUNTER — Ambulatory Visit: Payer: 59 | Admitting: Sports Medicine

## 2023-02-19 ENCOUNTER — Telehealth: Payer: Self-pay

## 2023-02-19 NOTE — Telephone Encounter (Signed)
Please see message below

## 2023-02-19 NOTE — Telephone Encounter (Signed)
Patient called back and [facility] will be doing urine test for patient.  Patient said to cancel request for appt for now.  [facility] will be treating him if needed after UA results come back.

## 2023-02-19 NOTE — Telephone Encounter (Signed)
Please confirm if there is anything we can do.

## 2023-02-19 NOTE — Telephone Encounter (Signed)
Patient called because of some pain he is having in his kidney area and thinks he might have an infection.  Requesting to make appt with Dr. Milinda Cave.  He will need transportation.  Please advise on how I am to address this patient.  My understanding he was to receive medical assistance at [facility] where he currently is living.  Dr. Milinda Cave would not be his primary care doctor but the facility doctor at rehab center would be PCP.  Please advise.  Patient can be reached at [phone].

## 2023-03-10 NOTE — Progress Notes (Unsigned)
[name] [facility] [address] [place] [address] Phone: [phone]   Assessment and Plan:     There are no diagnoses linked to this encounter.  ***   Pertinent previous records reviewed include ***   Follow Up: ***     Subjective:   I, [name], am serving as a Neurosurgeon for Doctor Richardean Sale   Chief Complaint: bilateral hand pain    HPI:    01/24/2023 Patient is a 72 year old male complaining of bilateral  hand pain. Patient states pain started 10 days ago, he has a swollen 2nd digit right hand , and swelling in his left wrist , no MOI, 1 month ago he had left hand cellulitis, decreased ROM, decreased grip strength , does note numbness and tingling, 800 mg ibu for the pain and that helps, no radiating pain on the right, left hand does have radiating pain down the fingers     02/11/2023 Patient states that his right finger is still puffy , left hand feels heavy and thick , decreased , decreased ROM on right shoulder would like PT referral     03/11/2023 Patient states     Relevant Historical Information: History of atrial fibrillation on chronic anticoagulation with Eliquis, elevated BMI, DM type II, CKD  Additional pertinent review of systems negative.   Current Outpatient Medications:    apixaban (ELIQUIS) 5 MG TABS tablet, Take 5 mg by mouth 2 (two) times daily., Disp: , Rfl:    b complex vitamins capsule, Take 1 capsule by mouth every morning., Disp: , Rfl:    butalbital-apap-caffeine-codeine (FIORICET WITH CODEINE) 50-325-40-30 MG capsule, TAKE 1 TO 2 CAPSULES BY MOUTH EVERY 6 HOURS AS NEEDED FOR HEADACHES. NOT TO EXCEED 6 CAPSULES PER DAY, Disp: 60 capsule, Rfl: 5   cetirizine (ZYRTEC) 10 MG tablet, Take 10 mg by mouth at bedtime., Disp: , Rfl:    diclofenac Sodium (VOLTAREN) 1 % GEL, Apply topically in the morning, at noon, and at bedtime., Disp: , Rfl:    diltiazem (TIAZAC) 120 MG 24 hr capsule, Take 1  capsule (120 mg total) by mouth daily., Disp: 90 capsule, Rfl: 3   glucose blood (ONETOUCH ULTRA) test strip, CHECK BLOOD SUGAR TWICE  DAILY, Disp: 200 strip, Rfl: 0   HYDROcodone-acetaminophen (NORCO/VICODIN) 5-325 MG tablet, Take 1-2 tablets by mouth every 6 (six) hours as needed for moderate pain. 1-2 tabs po bid prn pain (Patient taking differently: Take 1 tablet by mouth at bedtime. 1-2 tabs po bid prn pain), Disp: 10 tablet, Rfl: 0   Insulin Lispro Prot & Lispro (HUMALOG 75/25 MIX) (75-25) 100 UNIT/ML Kwikpen, Inject 30 Units into the skin 2 (two) times daily., Disp: , Rfl:    Insulin Pen Needle (B-D ULTRAFINE III SHORT PEN) 31G X 8 MM MISC, USE 1 PENNEEDLE TWICE DAILY, Disp: 200 each, Rfl: 1   metolazone (ZAROXOLYN) 2.5 MG tablet, Take 1 tablet (2.5 mg total) by mouth daily. In combination with Torsemide, Disp: 90 tablet, Rfl: 3   Multiple Vitamin (MULTIVITAMIN) tablet, Take 1 tablet by mouth daily., Disp: , Rfl:    naproxen (NAPROSYN) 500 MG tablet, Take 1 tablet (500 mg total) by mouth 2 times daily at 12 noon and 4 pm., Disp: 30 tablet, Rfl: 0   potassium chloride SA (KLOR-CON M20) 20 MEQ tablet, Take one tablet three times per week. (Patient taking differently: Take 20 mEq by mouth every Monday, Wednesday, and Friday. Take one tablet  three times per week.), Disp: 45 tablet, Rfl: 2   senna-docusate (SENOKOT-S) 8.6-50 MG tablet, Take 1 tablet by mouth at bedtime., Disp: , Rfl:    terazosin (HYTRIN) 10 MG capsule, Take 10 mg by mouth at bedtime., Disp: , Rfl:    testosterone cypionate (DEPOTESTOSTERONE CYPIONATE) 200 MG/ML injection, 200 mg once a week., Disp: , Rfl:    Torsemide 40 MG TABS, Take 40 mg by mouth daily., Disp: , Rfl:    traMADol (ULTRAM) 50 MG tablet, Take 50 mg by mouth daily at 6 (six) AM., Disp: , Rfl:    TRULICITY 1.5 MG/0.5ML SOPN, as directed Subcutaneous weekly for 30 days, Disp: , Rfl:    vitamin E 400 UNIT capsule, Take 400 Units by mouth every morning. Vitamin E with  Selenium, Disp: , Rfl:   Current Facility-Administered Medications:    bupivacaine (MARCAINE) 0.5 % (with pres) injection 3 mL, 3 mL, Other, Once, Tyrell Antonio, MD   Objective:     There were no vitals filed for this visit.    There is no height or weight on file to calculate BMI.    Physical Exam:    ***   Electronically signed by:  [name] [facility] 12:07 PM 03/10/23

## 2023-03-11 ENCOUNTER — Telehealth: Payer: Self-pay | Admitting: Sports Medicine

## 2023-03-11 ENCOUNTER — Telehealth: Payer: Self-pay | Admitting: Family Medicine

## 2023-03-11 ENCOUNTER — Ambulatory Visit (INDEPENDENT_AMBULATORY_CARE_PROVIDER_SITE_OTHER): Payer: 59 | Admitting: Sports Medicine

## 2023-03-11 VITALS — HR 76 | Ht 70.0 in | Wt 323.0 lb

## 2023-03-11 DIAGNOSIS — M1A09X0 Idiopathic chronic gout, multiple sites, without tophus (tophi): Secondary | ICD-10-CM

## 2023-03-11 MED ORDER — COLCHICINE 0.6 MG PO TABS: 0.6000 mg | ORAL_TABLET | Freq: Every day | ORAL | 0 refills | Status: DC | PRN

## 2023-03-11 MED ORDER — FEBUXOSTAT 40 MG PO TABS
80.0000 mg | ORAL_TABLET | Freq: Every day | ORAL | 1 refills | Status: DC
Start: 2023-03-11 — End: 2023-03-11

## 2023-03-11 MED ORDER — FEBUXOSTAT 40 MG PO TABS: 80.0000 mg | ORAL_TABLET | Freq: Every day | ORAL | 1 refills | Status: DC

## 2023-03-11 NOTE — Telephone Encounter (Signed)
Pt wanted our provider to know that the facility will not be following any of his instructions until they receive lab results. Apparently the facility has ordered labs, but has not drawn them yet. Pt seemed frustrated by this delay and is trying to get the labs drawn asap.

## 2023-03-11 NOTE — Patient Instructions (Addendum)
Good to see you  We will be taking over on your gout treatment  Increase Febuxostat 80 mg daily  Colchiceine 0.6 mg take daily as needed for 5-7 days for gout flare   4 week follow up

## 2023-03-11 NOTE — Telephone Encounter (Signed)
Copied from CRM [id number]. Topic: Medicare AWV >> Mar 11, 2023 12:14 PM Gwenith Spitz wrote: Reason for CRM: Called patient to schedule Medicare Annual Wellness Visit (AWV). Left message for patient to call back and schedule Medicare Annual Wellness Visit (AWV).  Last date of AWV: 02/13/2022  Please schedule an appointment at any time with Please schedule an appointment  any Wednesday as Tele/Video visits with Inetta Fermo, NHA. Please schedule AWVS with Inetta Fermo [facility].  Please schedule as video/tele visit only, Wednesdays only. .  If any questions, please contact me at [phone].  Thank you ,  Gabriel Cirri [facility] AWV TEAM Direct Dial [phone]

## 2023-03-11 NOTE — Telephone Encounter (Signed)
Contacted Ahad Argomaniz Wojtaszek to schedule their annual wellness visit. Appointment made for 03/26/2023.  Gabriel Cirri [facility] AWV TEAM Direct Dial [phone]

## 2023-03-12 LAB — LAB REPORT - SCANNED: EGFR: 39

## 2023-03-13 ENCOUNTER — Encounter: Payer: Self-pay | Admitting: Sports Medicine

## 2023-03-21 ENCOUNTER — Ambulatory Visit (HOSPITAL_BASED_OUTPATIENT_CLINIC_OR_DEPARTMENT_OTHER): Payer: 59 | Admitting: Family

## 2023-03-24 ENCOUNTER — Ambulatory Visit (HOSPITAL_BASED_OUTPATIENT_CLINIC_OR_DEPARTMENT_OTHER): Payer: 59 | Admitting: Family

## 2023-03-25 ENCOUNTER — Encounter (HOSPITAL_BASED_OUTPATIENT_CLINIC_OR_DEPARTMENT_OTHER): Payer: Self-pay | Admitting: Family

## 2023-03-25 ENCOUNTER — Ambulatory Visit (INDEPENDENT_AMBULATORY_CARE_PROVIDER_SITE_OTHER): Payer: 59 | Admitting: Family

## 2023-03-25 ENCOUNTER — Telehealth: Payer: Self-pay

## 2023-03-25 ENCOUNTER — Telehealth (HOSPITAL_BASED_OUTPATIENT_CLINIC_OR_DEPARTMENT_OTHER): Payer: Self-pay

## 2023-03-25 ENCOUNTER — Other Ambulatory Visit (HOSPITAL_COMMUNITY): Payer: Self-pay

## 2023-03-25 VITALS — BP 100/64 | HR 63 | Ht 70.0 in | Wt 334.0 lb

## 2023-03-25 DIAGNOSIS — I890 Lymphedema, not elsewhere classified: Secondary | ICD-10-CM

## 2023-03-25 DIAGNOSIS — I5032 Chronic diastolic (congestive) heart failure: Secondary | ICD-10-CM | POA: Diagnosis not present

## 2023-03-25 DIAGNOSIS — N1832 Chronic kidney disease, stage 3b: Secondary | ICD-10-CM

## 2023-03-25 DIAGNOSIS — I10 Essential (primary) hypertension: Secondary | ICD-10-CM

## 2023-03-25 DIAGNOSIS — D6859 Other primary thrombophilia: Secondary | ICD-10-CM | POA: Diagnosis not present

## 2023-03-25 DIAGNOSIS — I4821 Permanent atrial fibrillation: Secondary | ICD-10-CM

## 2023-03-25 MED ORDER — DAPAGLIFLOZIN PROPANEDIOL 10 MG PO TABS
10.0000 mg | ORAL_TABLET | Freq: Every day | ORAL | 5 refills | Status: DC
Start: 2023-03-25 — End: 2023-08-15

## 2023-03-25 MED ORDER — DAPAGLIFLOZIN PROPANEDIOL 10 MG PO TABS: 10.0000 mg | ORAL_TABLET | Freq: Every day | ORAL | 5 refills | Status: DC

## 2023-03-25 NOTE — Telephone Encounter (Signed)
Patient started on farxiga 10mg  daily today in clinic, will likely need prior auth

## 2023-03-25 NOTE — Progress Notes (Signed)
Office Visit    Patient Name: [name] Date of Encounter: 03/25/2023  PCP:  Lucky Cowboy, MD   [facility]  Cardiologist:  [name], MD  Advanced Practice Provider:  Azalee Course, PA Electrophysiologist:  None     Chief Complaint    [name] is a 72 y.o. male presents today for follow up of diastolic heart failure and atrial fibrillation.  Past Medical History    Past Medical History:  Diagnosis Date   ALLERGIC RHINITIS 08/11/2006   ASTHMA 08/11/2006   Bradycardia    Beta blocker d/c'd 2022   Chronic atrial fibrillation (HCC) 08/2014   Chronic combined systolic and diastolic CHF (congestive heart failure) [facility])    Cardiology f/u 12/2017: pt volume overloaded (R heart dysf suspected), BNP very high, lasix increased.  Repeat echo 12/2017: normal LV EF, mild DD, +RV syst dysfxn, mod pulm HTN, biatrial enlgmt.   Chronic constipation    Chronic renal insufficiency, stage 3 (moderate) (HCC)    Colon cancer screening 02/2021   02/2021 Cologuard POS->GI ref   DIABETES MELLITUS, TYPE II 08/11/2006   HYPERTENSION 08/11/2006   IDA (iron deficiency anemia)    04/2022.  Iron started.  Needs hemoccults   Impaired mobility and endurance    MRSA infection 06/2018   LL venous stasis ulcer infected   Normocytic anemia 2016-2019   03/2018 B12 normal, iron ok (ferritin borderline low).   OBESITY, MORBID 12/14/2007   saxenda started 05/2020 by [facility] wt mgmt center   OSA (obstructive sleep apnea)    to get sleep study with [facility] as of 12/03/2018 consult.   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  92018 ID started him on amoxil prophylaxis---ineffective.  End 2018/Jan 2019 penicillin G injections prophyl helpful but pt declined to continue this as of 01/2018 ID f/u.  ID talked him into resuming monthly penicillin G as of 02/2018 f/u.     Restless leg syndrome     Rx'd clonazepam 09/2017 and pt refused to take it after reading the medication's potential side effects.   Venous stasis ulcers of both lower extremities (HCC)    Severe lymphedema.  wound clinic care ongoing as of 01/2018   Past Surgical History:  Procedure Laterality Date   Carotid dopplers  07/23/2018   Left NORMAL.  Right 1-39% ICA stenosis, with <50% distal CCA stenosis (not hemodynamically significant)   LE venous dopplers Bilateral 03/06/2022   no dvt   RIGHT HEART CATH N/A 02/19/2022   Marked volume overload, R>>L HF, preserved CO. Procedure: RIGHT HEART CATH;  Surgeon: Dolores Patty, MD;  Location: [facility] INVASIVE CV LAB;  Service: Cardiovascular;  Laterality: N/A;   TRANSTHORACIC ECHOCARDIOGRAM  11/2007; 09/2014; 11/2015;12/2017   LV fxn normal, EF normal, mild dilation of left atrium.  2015 grade II diast dysfxn.  2017 EF 55-60%. 2019: LVEF 60-65%, mild RV syst dysf,biatrial enlgmt, mod pulm htn. 02/14/22 EF 55-60%, unable to assess diast fxn, LVH, o/w normal   URETERAL STENT PLACEMENT     virtual colonoscopy  01/2011   Normal    Allergies  Allergies  Allergen Reactions   Other Other (See Comments)    Sneezing, coughing   Hydralazine Other (See Comments)    Drowsiness/sedation/mental fog    History of Present Illness    [name] is a 72 y.o. male with a hx of morbid obesity, DM 2,  chronic diastolic heart failure, persistent atrial fibrillation, hypertension, bilateral lower extremity lymphedema with chronic wounds last seen by 12/23/2022  Admitted 02/2022 with acute on chronic heart failure.  He noted eating a lot of high sodium meals while staying in a hotel waiting to move to a new apartment.  Diuresed with IV Lasix and treated for UTI.  Echo with EF 55 to 60%, RV not well visualized.  RHC with RA 18, PA 63/19 (35), Fick CO/Cl 10.5/3.8, Thermo CO/Cl 7.2/2.6, PVR 1.9 WU (thermo), PAPi 2.4.  There was suspected pulmonary hypertension due to combination of left  heart disease and primary pulmonary hypertension related to untreated OSA and obesity.  He had a 78 pound weight loss during admission.  Discharged on torsemide 40 mg daily.  Discharge weight 333 pounds.  Initial TOC visit had gained 20pounds at SNF. Torsemide increased to 80mg  QD. Seen 04/05/22 still volume overloaded. Recommended take dose of metolazone for 2 days then transition to once per week with potassium.   Seen 04/16/22 noted to be taking Metolazone 2.5mg  and Torsemide 80mg  QD. Metolazone reduced to twice per week. Follow up labs 04/23/22 with creatinine 2.74, K 126, GFR 24 recommended for admission.   Hospitalized 6/20-6/22/23 given 500cc bolus and 100 mL/hr normal saline. Metolazone and Torsemide held. He was discharge don Torsemide. Discharge weight 139.7 kg.   At visit 06/17/22 noted SNF had not changed Metolazone from 5mg  to 2.5mg  as recommended by PCP. Kidney function was improved 06/19/22 with creatinine 1.37 with Torsemide 40mg  QD and Metolazone 2.5mg  QD. He saw Dr. [name] 09/20/22 and was stable from a cardiac perspective with no changes made.  Last seen 12/23/2022.  Updated CMP with creatinine 1.66, GFR 44.  Metolazone and torsemide daily were continued.  He presents today for follow up independently. Remains at SNF and slow progress in mobility, unable to stand to weigh.  Lower extremity edema has been well-controlled with bilateral compression wraps.  Difficult to assess exertional dyspnea as he is wheelchair-bound.  Has appointment with arthritis clinic upcoming to trial injections. Continues to follow with sports medicine regarding gout.  Notes intermittent right hand swelling with pain predominantly at night-encouraged to trial wrist brace at night.  Notes he recently got lymphedema pump 3 days ago but has not been able to use yet.  No chest pain, orthopnea, PND.  EKGs/Labs/Other Studies Reviewed:   The following studies were reviewed today:  Cardiac Studies & Procedures   CARDIAC  CATHETERIZATION  CARDIAC CATHETERIZATION 02/19/2022  Narrative Findings:  RA = 18 RV = 60/24 PA =  63/19 (35) PCW = 22 Fick cardiac output/index = 10.5/3.8 Thermo CO/CI = 7.2/2.6 PVR = 1.9 WU (Thermo) FA sat = 92% PA sat = 63%, 65% High SVC = 68% PAPi = 2.4  Assessment: 1. Marked volume overload with R>>L heart failure 2. Preserved cardiac output  Plan/Discussion:  Remains markedly volume overloaded. Consider use of lasix gtt +/- acetazolamide or metolazone to facilitate diuresis. Keep LEs wrapped.  Arvilla Meres, MD 9:20 AM     ECHOCARDIOGRAM  ECHOCARDIOGRAM COMPLETE 02/14/2022  Narrative ECHOCARDIOGRAM REPORT    Patient Name:   [name] Date of Exam: 02/14/2022 Medical Rec #:  [id number]          Height:       70.0 in Accession #:    [id number]         Weight:       411.2 lb Date of Birth:  01-25-1951  BSA:          2.835 m Patient Age:    70 years           BP:           115/50 mmHg Patient Gender: M                  HR:           60 bpm. Exam Location:  Inpatient  Procedure: 2D Echo and Intracardiac Opacification Agent  Indications:    Cardiomegaly  History:        Patient has prior history of Echocardiogram examinations, most recent 12/29/2017. Arrythmias:Atrial Fibrillation; Risk Factors:Diabetes and Hypertension.  Sonographer:    Eduard Roux Referring Phys: [id number] Kieth Brightly MACNEIL   Sonographer Comments: Patient is morbidly obese. Image acquisition challenging due to patient body habitus. IMPRESSIONS   1. Left ventricular ejection fraction, by estimation, is 55 to 60%. The left ventricle has normal function. The left ventricle has no regional wall motion abnormalities. There is moderate left ventricular hypertrophy. Left ventricular diastolic function could not be evaluated. 2. Right ventricular systolic function was not well visualized. The right ventricular size is not well visualized. 3. Left atrial size was mildly  dilated. 4. The mitral valve is normal in structure. No evidence of mitral valve regurgitation. 5. The aortic valve is normal in structure. There is mild calcification of the aortic valve. Aortic valve regurgitation is trivial.  FINDINGS Left Ventricle: Left ventricular ejection fraction, by estimation, is 55 to 60%. The left ventricle has normal function. The left ventricle has no regional wall motion abnormalities. The left ventricular internal cavity size was normal in size. There is moderate left ventricular hypertrophy. Left ventricular diastolic function could not be evaluated due to atrial fibrillation. Left ventricular diastolic function could not be evaluated.  Right Ventricle: The right ventricular size is not well visualized. Right vetricular wall thickness was not well visualized. Right ventricular systolic function was not well visualized.  Left Atrium: Left atrial size was mildly dilated.  Right Atrium: Right atrial size was normal in size.  Pericardium: There is no evidence of pericardial effusion.  Mitral Valve: The mitral valve is normal in structure. No evidence of mitral valve regurgitation.  Tricuspid Valve: The tricuspid valve is grossly normal. Tricuspid valve regurgitation is mild.  Aortic Valve: The aortic valve is normal in structure. There is mild calcification of the aortic valve. Aortic valve regurgitation is trivial. Aortic valve peak gradient measures 6.3 mmHg.  Pulmonic Valve: The pulmonic valve was normal in structure. Pulmonic valve regurgitation is not visualized.  Aorta: The aortic root and ascending aorta are structurally normal, with no evidence of dilitation.  IAS/Shunts: The interatrial septum was not well visualized.   LEFT VENTRICLE PLAX 2D LVIDd:         5.10 cm   Diastology LVIDs:         3.70 cm   LV e' medial:    7.20 cm/s LV PW:         1.30 cm   LV E/e' medial:  17.6 LV IVS:        1.40 cm   LV e' lateral:   3.53 cm/s LVOT diam:      2.00 cm   LV E/e' lateral: 36.0 LV SV:         66 LV SV Index:   23 LVOT Area:     3.14 cm   IVC IVC diam: 3.70 cm  LEFT  ATRIUM              Index LA diam:        4.80 cm  1.69 cm/m LA Vol (A2C):   108.0 ml 38.09 ml/m LA Vol (A4C):   75.4 ml  26.58 ml/m LA Biplane Vol: 107.0 ml 37.74 ml/m AORTIC VALVE                 PULMONIC VALVE AV Area (Vmax): 2.43 cm     PV Vmax:       0.90 m/s AV Vmax:        125.50 cm/s  PV Peak grad:  3.2 mmHg AV Peak Grad:   6.3 mmHg LVOT Vmax:      97.00 cm/s LVOT Vmean:     57.300 cm/s LVOT VTI:       0.211 m  AORTA Ao Root diam: 3.20 cm Ao Asc diam:  3.60 cm  MITRAL VALVE                TRICUSPID VALVE MV Area (PHT): 4.99 cm     TR Peak grad:   38.7 mmHg MV Decel Time: 152 msec     TR Vmax:        311.00 cm/s MV E velocity: 127.00 cm/s MV A velocity: 36.50 cm/s   SHUNTS MV E/A ratio:  3.48         Systemic VTI:  0.21 m Systemic Diam: 2.00 cm  Kristeen Miss MD Electronically signed by Kristeen Miss MD Signature Date/Time: 02/14/2022/5:04:52 PM    Final             EKG:  No EKG today.  Recent Labs: 04/11/2022: TSH 5.86 04/23/2022: B Natriuretic Peptide 162.0; Magnesium 2.0 12/23/2022: ALT 11; BUN 46; Creatinine, Ser 1.66; Hemoglobin 14.5; Platelets 268; Potassium 3.6; Sodium 134  Recent Lipid Panel    Component Value Date/Time   CHOL 101 04/04/2021 0844   TRIG 61 04/04/2021 0844   HDL 41 04/04/2021 0844   CHOLHDL 2.5 04/04/2021 0844   VLDL 12.2 07/12/2020 1015   LDLCALC 46 04/04/2021 0844    Risk Assessment/Calculations:   CHA2DS2-VASc Score =     This indicates a  % annual risk of stroke. The patient's score is based upon:       Home Medications   Current Meds  Medication Sig   apixaban (ELIQUIS) 5 MG TABS tablet Take 5 mg by mouth 2 (two) times daily.   b complex vitamins capsule Take 1 capsule by mouth every morning.   butalbital-apap-caffeine-codeine (FIORICET WITH CODEINE) 50-325-40-30 MG capsule TAKE 1 TO 2  CAPSULES BY MOUTH EVERY 6 HOURS AS NEEDED FOR HEADACHES. NOT TO EXCEED 6 CAPSULES PER DAY   cetirizine (ZYRTEC) 10 MG tablet Take 10 mg by mouth at bedtime.   diclofenac Sodium (VOLTAREN) 1 % GEL Apply topically in the morning, at noon, and at bedtime.   diltiazem (TIAZAC) 120 MG 24 hr capsule Take 1 capsule (120 mg total) by mouth daily.   febuxostat (ULORIC) 40 MG tablet Take 40 mg by mouth daily.   glucose blood (ONETOUCH ULTRA) test strip CHECK BLOOD SUGAR TWICE  DAILY   HYDROcodone-acetaminophen (NORCO/VICODIN) 5-325 MG tablet Take 1-2 tablets by mouth every 6 (six) hours as needed for moderate pain. 1-2 tabs po bid prn pain (Patient taking differently: Take 1 tablet by mouth at bedtime. 1-2 tabs po bid prn pain)   Insulin Lispro Prot & Lispro (HUMALOG 75/25 MIX) (75-25) 100 UNIT/ML Kwikpen Inject  30 Units into the skin 2 (two) times daily.   Insulin Pen Needle (B-D ULTRAFINE III SHORT PEN) 31G X 8 MM MISC USE 1 PENNEEDLE TWICE DAILY   metolazone (ZAROXOLYN) 2.5 MG tablet Take 1 tablet (2.5 mg total) by mouth daily. In combination with Torsemide   Multiple Vitamin (MULTIVITAMIN) tablet Take 1 tablet by mouth daily.   potassium chloride SA (KLOR-CON M20) 20 MEQ tablet Take one tablet three times per week. (Patient taking differently: Take 20 mEq by mouth every Monday, Wednesday, and Friday. Take one tablet three times per week.)   senna-docusate (SENOKOT-S) 8.6-50 MG tablet Take 1 tablet by mouth at bedtime.   terazosin (HYTRIN) 10 MG capsule Take 10 mg by mouth at bedtime.   testosterone cypionate (DEPOTESTOSTERONE CYPIONATE) 200 MG/ML injection 200 mg once a week.   Torsemide 40 MG TABS Take 40 mg by mouth daily.   traMADol (ULTRAM) 50 MG tablet Take 50 mg by mouth daily at 6 (six) AM.   TRULICITY 1.5 MG/0.5ML SOPN as directed Subcutaneous weekly for 30 days   vitamin E 400 UNIT capsule Take 400 Units by mouth every morning. Vitamin E with Selenium   Current Facility-Administered  Medications for the 03/25/23 encounter (Office Visit) with Alver Sorrow, NP  Medication   bupivacaine (MARCAINE) 0.5 % (with pres) injection 3 mL     Review of Systems      All other systems reviewed and are otherwise negative except as noted above.  Physical Exam    VS:  BP 100/64   Pulse 63   Ht 5\' 10"  (1.778 m)   Wt (!) 334 lb (151.5 kg)   BMI 47.92 kg/m  , BMI Body mass index is 47.92 kg/m.  Wt Readings from Last 3 Encounters:  03/25/23 (!) 334 lb (151.5 kg)  03/11/23 (!) 323 lb (146.5 kg)  02/11/23 (!) 323 lb (146.5 kg)     GEN: Well nourished, overweight, well developed, in no acute distress. HEENT: normal. Neck: Supple, no JVD, carotid bruits, or masses. Cardiac: IRIR, no murmurs, rubs, or gallops. No clubbing, cyanosis, edema.  Radials/PT 2+ and equal bilaterally.  Respiratory:  Respirations regular and unlabored, clear to auscultation bilaterally. GI: Soft, nontender, nondistended. MS: No deformity or atrophy. Skin: Warm and dry, no rash.  Bilateral lower extremity Unna boots in place. Neuro:  Strength and sensation are intact. Psych: Normal affect.  Assessment & Plan    HFpEF / Right sided heart failure / Pulmonary hypertension -. Volume status difficult to ascertain due to body habitus. Bilateral LE in compression wrap with no edema appreciated on exam. Per SNF records taking Torsemide 40mg  QD and metolazone 5 mg daily.  Add Farxiga 10 mg daily for baseline diuretic effect, protection of renal function, heart failure benefit with BMP at facility in 1 week.  Paper prescription as well as electronic prescription and paper lab slips provided to SNF.  Snores - Sleep study when discharged from SNF. Given RHC and body habitus, plan for in lab split night sleep study. Coordinate at follow up as still at SNF and mobility not yet adequate to transfer to bed independently.   Longstanding persistent atrial fibrillation -asymptomatic in regards to atrial fibrillation with  no palpitations.  Rate controlled today continue diltiazem 120 mg daily.  Continue Eliquis 5 mg twice daily.  Denies bleeding complications.  Does not meet dose reduction criteria.  12/23/2022 hemoglobin 14.5.  CKDIIIb - Careful titration of diuretic and antihypertensive.  BMP in 1 week, as  above  DM2 - Currently managed at SNF. Insulin dependent.  Also on Trulicity per NP at SNF.  HTN - Now with relative hypotension though asymptomatic with no lightheadedness. Continue present antihypertensive regimen.   Lymphedema- reports healing well. Bilateral LE compression wraps in place.  Lymphedema pumps recently delivered but has not been able to utilize yet-requested SNF help him to use once per day.  OA - Limits mobility. Focused on weight loss to hopefully qualify for surgery.   Disposition: Follow up in 3-4 months with [name], MD or APP.  Signed, Alver Sorrow, NP 03/25/2023, 10:39 AM [facility]

## 2023-03-25 NOTE — Telephone Encounter (Signed)
Pharmacy Patient Advocate Encounter   Received notification from RN  that prior authorization for FARXIGA 10 is required/requested.   PER TEST CLAIM/ NO P/A IS REQUIRED

## 2023-03-25 NOTE — Patient Instructions (Signed)
Medication Instructions:  Your physician has recommended you make the following change in your medication:  Start Farxiga 10mg  daily   *If you need a refill on your cardiac medications before your next appointment, please call your pharmacy*   Lab Work: Your physician recommends that you return for lab work in one week for BMP at facility  If you have labs (blood work) drawn today and your tests are completely normal, you will receive your results only by: MyChart Message (if you have MyChart) OR A paper copy in the mail If you have any lab test that is abnormal or we need to change your treatment, we will call you to review the results.  Follow-Up: At [facility], you and your health needs are our priority.  As part of our continuing mission to provide you with exceptional heart care, we have created designated Provider Care Teams.  These Care Teams include your primary Cardiologist (physician) and Advanced Practice Providers (APPs -  Physician Assistants and Nurse Practitioners) who all work together to provide you with the care you need, when you need it.  We recommend signing up for the patient portal called "MyChart".  Sign up information is provided on this After Visit Summary.  MyChart is used to connect with patients for Virtual Visits (Telemedicine).  Patients are able to view lab/test results, encounter notes, upcoming appointments, etc.  Non-urgent messages can be sent to your provider as well.   To learn more about what you can do with MyChart, go to [email or web address].    Your next appointment:   3-4 month(s)  Provider:   K. Italy Hilty, MD or Gillian Shields, NP

## 2023-03-26 ENCOUNTER — Telehealth: Payer: Self-pay | Admitting: Internal Medicine

## 2023-03-26 NOTE — Telephone Encounter (Signed)
Spoke with Hospital doctor and advised Dr Josepha Pigg MD with [facility] ordered,  number given  She will reach out for orders regarding settings

## 2023-03-26 NOTE — Telephone Encounter (Signed)
Caller wants a call back to discuss further information about the patient's lymphedema pump.

## 2023-04-01 ENCOUNTER — Telehealth (INDEPENDENT_AMBULATORY_CARE_PROVIDER_SITE_OTHER): Payer: Self-pay

## 2023-04-01 LAB — LAB REPORT - SCANNED: EGFR: 31

## 2023-04-01 NOTE — Progress Notes (Signed)
Aleen Sells D.[facility] [address] [place] [address] Phone: [phone]   Assessment and Plan:     1. Chronic gout of multiple sites, unspecified cause 2. Right shoulder pain, unspecified chronicity 3. Right hand pain  -Chronic with exacerbation, subsequent sports medicine visit - Patient's gout flare has significantly improved since prior office visit with patient starting febuxostat 40 mg daily.  Uric acid level was rechecked on 03/12/2023 and was still elevated at 10.5.  I would recommend rechecking uric acid level, and if still elevated, I would increase to febuxostat 80 mg daily if approved by patient's nephrologist with GFR at 31 and most recent lab work. 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 -For treating future acute flares of gout, medication management is complicated by patient's past medical history which include DM type II and chronic anticoagulation on Eliquis.  Our best option would most likely be a short prednisone course understanding that it would elevate patient's blood glucose as I would not recommend colchicine or NSAID use with patient's chronic anticoagulation on Eliquis.  Pertinent previous records reviewed include 10 pages of paperwork provided by patient's facility.  Paperwork filled out with above assessment and plan and returned to patient to provide to facility.  My assessment and plan scanned into chart   Follow Up: As needed   Subjective:   I, [name], am serving as a Neurosurgeon for Doctor Richardean Sale   Chief Complaint: bilateral hand pain    HPI:    01/24/2023 Patient is a 72 year old male complaining of bilateral  hand pain. Patient states pain started 10 days ago,  he has a swollen 2nd digit right hand , and swelling in his left wrist , no MOI, 1 month ago he had left hand cellulitis, decreased ROM, decreased grip strength , does note numbness and tingling, 800 mg ibu for the pain and that helps, no radiating pain on the right, left hand does have radiating pain down the fingers     02/11/2023 Patient states that his right finger is still puffy , left hand feels heavy and thick , decreased , decreased ROM on right shoulder would like PT referral     03/11/2023 Patient states swelling in the right hand, swelling and pain started Friday , no MOI, in radiates up to the forearm, he isnt able to do ADLs, ibu for the pain and that doesn't help to much    would like to discus a gel injection that he will be maybe getting on Thursday    04/08/2023 Patient states PT wants to know the diagnosis so they can work with him, pain goes up to his shoulder, heat therapy works well for him , he is in brace that limits motion   Relevant Historical Information: History of atrial fibrillation on chronic anticoagulation with Eliquis, elevated BMI, DM type II, CKD  Additional pertinent review of systems negative.   Current Outpatient Medications:    apixaban (ELIQUIS) 5 MG TABS tablet, Take 5 mg by mouth 2 (two) times daily., Disp: , Rfl:    b complex vitamins capsule, Take 1 capsule by mouth every morning., Disp: , Rfl:    butalbital-apap-caffeine-codeine (FIORICET WITH CODEINE) 50-325-40-30 MG capsule, TAKE 1 TO 2 CAPSULES BY MOUTH EVERY 6 HOURS  AS NEEDED FOR HEADACHES. NOT TO EXCEED 6 CAPSULES PER DAY, Disp: 60 capsule, Rfl: 5   cetirizine (ZYRTEC) 10 MG tablet, Take 10 mg by mouth at bedtime., Disp: , Rfl:    dapagliflozin propanediol (FARXIGA) 10 MG TABS tablet, Take 1 tablet (10 mg total) by mouth daily before breakfast., Disp: 30 tablet, Rfl: 5   diclofenac Sodium (VOLTAREN) 1 % GEL, Apply topically in the morning, at noon, and at bedtime., Disp: , Rfl:    diltiazem  (TIAZAC) 120 MG 24 hr capsule, Take 1 capsule (120 mg total) by mouth daily., Disp: 90 capsule, Rfl: 3   febuxostat (ULORIC) 40 MG tablet, Take 40 mg by mouth daily., Disp: , Rfl:    glucose blood (ONETOUCH ULTRA) test strip, CHECK BLOOD SUGAR TWICE  DAILY, Disp: 200 strip, Rfl: 0   HYDROcodone-acetaminophen (NORCO/VICODIN) 5-325 MG tablet, Take 1-2 tablets by mouth every 6 (six) hours as needed for moderate pain. 1-2 tabs po bid prn pain (Patient taking differently: Take 1 tablet by mouth at bedtime. 1-2 tabs po bid prn pain), Disp: 10 tablet, Rfl: 0   Insulin Lispro Prot & Lispro (HUMALOG 75/25 MIX) (75-25) 100 UNIT/ML Kwikpen, Inject 30 Units into the skin 2 (two) times daily., Disp: , Rfl:    Insulin Pen Needle (B-D ULTRAFINE III SHORT PEN) 31G X 8 MM MISC, USE 1 PENNEEDLE TWICE DAILY, Disp: 200 each, Rfl: 1   metolazone (ZAROXOLYN) 2.5 MG tablet, Take 1 tablet (2.5 mg total) by mouth daily. In combination with Torsemide, Disp: 90 tablet, Rfl: 3   Multiple Vitamin (MULTIVITAMIN) tablet, Take 1 tablet by mouth daily., Disp: , Rfl:    potassium chloride SA (KLOR-CON M20) 20 MEQ tablet, Take one tablet three times per week. (Patient taking differently: Take 20 mEq by mouth every Monday, Wednesday, and Friday. Take one tablet three times per week.), Disp: 45 tablet, Rfl: 2   senna-docusate (SENOKOT-S) 8.6-50 MG tablet, Take 1 tablet by mouth at bedtime., Disp: , Rfl:    terazosin (HYTRIN) 10 MG capsule, Take 10 mg by mouth at bedtime., Disp: , Rfl:    testosterone cypionate (DEPOTESTOSTERONE CYPIONATE) 200 MG/ML injection, 200 mg once a week., Disp: , Rfl:    Torsemide 40 MG TABS, Take 40 mg by mouth daily., Disp: , Rfl:    traMADol (ULTRAM) 50 MG tablet, Take 50 mg by mouth daily at 6 (six) AM., Disp: , Rfl:    TRULICITY 1.5 MG/0.5ML SOPN, as directed Subcutaneous weekly for 30 days, Disp: , Rfl:    vitamin E 400 UNIT capsule, Take 400 Units by mouth every morning. Vitamin E with Selenium, Disp: ,  Rfl:   Current Facility-Administered Medications:    bupivacaine (MARCAINE) 0.5 % (with pres) injection 3 mL, 3 mL, Other, Once, Tyrell Antonio, MD   Objective:     Vitals:   04/08/23 1033  Pulse: 74  SpO2: 95%  Weight: (!) 327 lb (148.3 kg)  Height: 5\' 10"  (1.778 m)      Body mass index is 46.92 kg/m.    Physical Exam:    General: Appears well, nad, nontoxic and pleasant Neuro:sensation intact, strength is 5/5 with df/pf/inv/ev, muscle tone wnl Skin:no susupicious lesions or rashes   Right hand/wrist:   No deformity No erythema and swelling appreciated over right hand  Wrist ROM  Ext 90, flexion70, radial/ulnar deviation 30    Electronically signed by:  Aleen Sells [facility] 11:31 AM 04/08/23

## 2023-04-01 NOTE — Telephone Encounter (Signed)
Amber from [facility] left a message requesting order for pressure and time settings for lymphedema pump. I return call and recommended to contact company for further requesting information. Amber informed that someone would be coming out to the facility to set up the lymph pump.

## 2023-04-08 ENCOUNTER — Ambulatory Visit (INDEPENDENT_AMBULATORY_CARE_PROVIDER_SITE_OTHER): Payer: 59 | Admitting: Sports Medicine

## 2023-04-08 VITALS — HR 74 | Ht 70.0 in | Wt 327.0 lb

## 2023-04-08 DIAGNOSIS — M25511 Pain in right shoulder: Secondary | ICD-10-CM | POA: Diagnosis not present

## 2023-04-08 DIAGNOSIS — M1A09X0 Idiopathic chronic gout, multiple sites, without tophus (tophi): Secondary | ICD-10-CM | POA: Diagnosis not present

## 2023-04-08 DIAGNOSIS — M79641 Pain in right hand: Secondary | ICD-10-CM

## 2023-04-08 NOTE — Patient Instructions (Signed)
As needed follow up 

## 2023-05-01 ENCOUNTER — Encounter: Payer: 59 | Admitting: Urology

## 2023-06-19 ENCOUNTER — Encounter: Payer: Self-pay | Admitting: Urology

## 2023-06-19 ENCOUNTER — Ambulatory Visit (INDEPENDENT_AMBULATORY_CARE_PROVIDER_SITE_OTHER): Payer: 59 | Admitting: Urology

## 2023-06-19 VITALS — BP 111/60 | HR 62 | Ht 70.0 in | Wt 300.6 lb

## 2023-06-19 DIAGNOSIS — N138 Other obstructive and reflux uropathy: Secondary | ICD-10-CM

## 2023-06-19 DIAGNOSIS — N401 Enlarged prostate with lower urinary tract symptoms: Secondary | ICD-10-CM | POA: Diagnosis not present

## 2023-06-19 DIAGNOSIS — N390 Urinary tract infection, site not specified: Secondary | ICD-10-CM

## 2023-06-19 DIAGNOSIS — Z87440 Personal history of urinary (tract) infections: Secondary | ICD-10-CM

## 2023-06-19 DIAGNOSIS — N471 Phimosis: Secondary | ICD-10-CM | POA: Diagnosis not present

## 2023-06-19 DIAGNOSIS — N4883 Acquired buried penis: Secondary | ICD-10-CM

## 2023-06-19 DIAGNOSIS — R3916 Straining to void: Secondary | ICD-10-CM

## 2023-06-19 LAB — URINALYSIS, ROUTINE W REFLEX MICROSCOPIC
Bilirubin, UA: NEGATIVE
Ketones, UA: NEGATIVE
Nitrite, UA: NEGATIVE
Protein,UA: NEGATIVE
Specific Gravity, UA: 1.01 (ref 1.005–1.030)
Urobilinogen, Ur: 0.2 mg/dL (ref 0.2–1.0)
pH, UA: 6.5 (ref 5.0–7.5)

## 2023-06-19 LAB — MICROSCOPIC EXAMINATION: WBC, UA: >30 /hpf — AB (ref 0–5)

## 2023-06-19 NOTE — Progress Notes (Signed)
Subjective: 1. Recurrent urinary tract infection   2. BPH with urinary obstruction   3. Straining to void   4. Phimosis of penis   5. Acquired buried penis      Consult requested by Dr. Nicoletta Ba.  [name] is a 72 yo male who is sent for recurrent UTI's. His last infection was about a month ago and he has had UTI's every couple of months. He has had no fever.  He will have a sensation on incomplete emptying and straining to void.  He would have a pinching sensation at the tip of the penis.  He had a CT stone study in 9/23 that was negative for stones or GU issues.  He was in the hospital in [place] in May or June for AKI.  That  has improved.   He has frequency q1hr during the day and night.  He has a normal stream.  He doesn't have UUI.  He uses a bedpan since he isn't ambulatory.  He had a ureteral stent in the 1990's.   His UA today has >30 WBC, 3-10 RBC's and a few bacteria.  He has a buried penis.   His prior cultures in [facility] have had Mx species.   ROS:  Review of Systems  Respiratory:  Negative for shortness of breath.   Cardiovascular:  Negative for chest pain and leg swelling.  Neurological:  Positive for weakness.  All other systems reviewed and are negative.   Allergies  Allergen Reactions   Other Other (See Comments)    Sneezing, coughing   Hydralazine Other (See Comments)    Drowsiness/sedation/mental fog    Past Medical History:  Diagnosis Date   ALLERGIC RHINITIS 08/11/2006   ASTHMA 08/11/2006   Bradycardia    Beta blocker d/c'd 2022   Chronic atrial fibrillation (HCC) 08/2014   Chronic combined systolic and diastolic CHF (congestive heart failure) [facility])    Cardiology f/u 12/2017: pt volume overloaded (R heart dysf suspected), BNP very high, lasix increased.  Repeat echo 12/2017: normal LV EF, mild DD, +RV syst dysfxn, mod pulm HTN, biatrial enlgmt.   Chronic constipation    Chronic renal insufficiency, stage 3 (moderate) (HCC)    Colon cancer screening  02/2021   02/2021 Cologuard POS->GI ref   DIABETES MELLITUS, TYPE II 08/11/2006   HYPERTENSION 08/11/2006   IDA (iron deficiency anemia)    04/2022.  Iron started.  Needs hemoccults   Impaired mobility and endurance    MRSA infection 06/2018   LL venous stasis ulcer infected   Normocytic anemia 2016-2019   03/2018 B12 normal, iron ok (ferritin borderline low).   OBESITY, MORBID 12/14/2007   saxenda started 05/2020 by [facility] wt mgmt center   OSA (obstructive sleep apnea)    to get sleep study with [facility]) as of 12/03/2018 consult.   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  [id number] ID started him on amoxil prophylaxis---ineffective.  End 2018/Jan 2019 penicillin G injections prophyl helpful but pt declined to continue this as of 01/2018 ID f/u.  ID talked him into resuming monthly penicillin G as of 02/2018 f/u.     Restless leg syndrome    Rx'd clonazepam 09/2017 and pt refused to take it after reading the medication's potential side effects.   Venous stasis ulcers of both lower extremities (HCC)    Severe lymphedema.  wound clinic care ongoing as of 01/2018    Past Surgical History:  Procedure Laterality Date   Carotid dopplers  07/23/2018   Left NORMAL.  Right 1-39% ICA stenosis, with <50% distal CCA stenosis (not hemodynamically significant)   LE venous dopplers Bilateral 03/06/2022   no dvt   RIGHT HEART CATH N/A 02/19/2022   Marked volume overload, R>>L HF, preserved CO. Procedure: RIGHT HEART CATH;  Surgeon: Dolores Patty, MD;  Location: [facility] INVASIVE CV LAB;  Service: Cardiovascular;  Laterality: N/A;   TRANSTHORACIC ECHOCARDIOGRAM  11/2007; 09/2014; 11/2015;12/2017   LV fxn normal, EF normal, mild dilation of left atrium.  2015 grade II diast dysfxn.  2017 EF 55-60%. 2019: LVEF 60-65%, mild RV syst dysf,biatrial enlgmt, mod pulm htn. 02/14/22 EF 55-60%, unable to assess diast fxn, LVH, o/w normal    URETERAL STENT PLACEMENT     virtual colonoscopy  01/2011   Normal    Social History   Socioeconomic History   Marital status: Single    Spouse name: Not on file   Number of children: 0   Years of education: 14   Highest education level: Bachelor's degree (e.g., BA, AB, BS)  Occupational History   Occupation: retired  Tobacco Use   Smoking status: Never    Passive exposure: Never   Smokeless tobacco: Never  Vaping Use   Vaping status: Never Used  Substance and Sexual Activity   Alcohol use: No   Drug use: No   Sexual activity: Not Currently  Other Topics Concern   Not on file  Social History Narrative   Single, no children.   Lives in [place] since 1985 [place]   Occupation: Science writer for [employer].   No Tobacco, no alc, drugs.   Exercise: no    Diet: no      Social Determinants of Health   Financial Resource Strain: Low Risk  (10/06/2022)   Overall Financial Resource Strain (CARDIA)    Difficulty of Paying Living Expenses: Not hard at all  Food Insecurity: No Food Insecurity (10/06/2022)   Hunger Vital Sign    Worried About Running Out of Food in the Last Year: Never true    Ran Out of Food in the Last Year: Never true  Recent Concern: Food Insecurity - Food Insecurity Present (07/31/2022)   Hunger Vital Sign    Worried About Running Out of Food in the Last Year: Sometimes true    Ran Out of Food in the Last Year: Sometimes true  Transportation Needs: No Transportation Needs (10/08/2022)   PRAPARE - Administrator, Civil Service (Medical): No    Lack of Transportation (Non-Medical): No  Physical Activity: Unknown (10/06/2022)   Exercise Vital Sign    Days of Exercise per Week: 0 days    Minutes of Exercise per Session: Not on file  Recent Concern: Physical Activity - Inactive (10/06/2022)   Exercise Vital Sign    Days of Exercise per Week: 0 days    Minutes of Exercise per Session: 0 min  Stress: No Stress Concern Present  (10/06/2022)   [employer] of Occupational Health - Occupational Stress Questionnaire    Feeling of Stress : Only a little  Social Connections: Moderately Isolated (10/06/2022)   Social Connection and Isolation Panel [NHANES]    Frequency of Communication with Friends and Family: Once a week    Frequency of Social Gatherings with Friends and Family: Never    Attends Religious Services: More than 4 times per year    Active Member of [employer] or Organizations: Yes  Attends Banker Meetings: More than 4 times per year    Marital Status: Never married  Intimate Partner Violence: Not At Risk (02/13/2022)   Humiliation, Afraid, Rape, and Kick questionnaire    Fear of Current or Ex-Partner: No    Emotionally Abused: No    Physically Abused: No    Sexually Abused: No    Family History  Problem Relation Age of Onset   Diabetes Mother    Diabetes Sister    Hydrocephalus Sister        NPH    Anti-infectives: Anti-infectives (From admission, onward)    None       Current Outpatient Medications  Medication Sig Dispense Refill   apixaban (ELIQUIS) 5 MG TABS tablet Take 5 mg by mouth 2 (two) times daily.     b complex vitamins capsule Take 1 capsule by mouth every morning.     butalbital-apap-caffeine-codeine (FIORICET WITH CODEINE) 50-325-40-30 MG capsule TAKE 1 TO 2 CAPSULES BY MOUTH EVERY 6 HOURS AS NEEDED FOR HEADACHES. NOT TO EXCEED 6 CAPSULES PER DAY 60 capsule 5   cetirizine (ZYRTEC) 10 MG tablet Take 10 mg by mouth at bedtime.     dapagliflozin propanediol (FARXIGA) 10 MG TABS tablet Take 1 tablet (10 mg total) by mouth daily before breakfast. 30 tablet 5   diclofenac Sodium (VOLTAREN) 1 % GEL Apply topically in the morning, at noon, and at bedtime.     diltiazem (TIAZAC) 120 MG 24 hr capsule Take 1 capsule (120 mg total) by mouth daily. 90 capsule 3   febuxostat (ULORIC) 40 MG tablet Take 40 mg by mouth daily.     glucose blood (ONETOUCH ULTRA) test strip CHECK  BLOOD SUGAR TWICE  DAILY 200 strip 0   HYDROcodone-acetaminophen (NORCO/VICODIN) 5-325 MG tablet Take 1-2 tablets by mouth every 6 (six) hours as needed for moderate pain. 1-2 tabs po bid prn pain (Patient taking differently: Take 1 tablet by mouth at bedtime. 1-2 tabs po bid prn pain) 10 tablet 0   Insulin Lispro Prot & Lispro (HUMALOG 75/25 MIX) (75-25) 100 UNIT/ML Kwikpen Inject 30 Units into the skin 2 (two) times daily.     Insulin Pen Needle (B-D ULTRAFINE III SHORT PEN) 31G X 8 MM MISC USE 1 PENNEEDLE TWICE DAILY 200 each 1   metolazone (ZAROXOLYN) 2.5 MG tablet Take 1 tablet (2.5 mg total) by mouth daily. In combination with Torsemide 90 tablet 3   Multiple Vitamin (MULTIVITAMIN) tablet Take 1 tablet by mouth daily.     potassium chloride SA (KLOR-CON M20) 20 MEQ tablet Take one tablet three times per week. (Patient taking differently: Take 20 mEq by mouth every Monday, Wednesday, and Friday. Take one tablet three times per week.) 45 tablet 2   senna-docusate (SENOKOT-S) 8.6-50 MG tablet Take 1 tablet by mouth at bedtime.     terazosin (HYTRIN) 10 MG capsule Take 10 mg by mouth at bedtime.     testosterone cypionate (DEPOTESTOSTERONE CYPIONATE) 200 MG/ML injection 200 mg once a week.     Torsemide 40 MG TABS Take 40 mg by mouth daily.     traMADol (ULTRAM) 50 MG tablet Take 50 mg by mouth daily at 6 (six) AM.     TRULICITY 1.5 MG/0.5ML SOPN as directed Subcutaneous weekly for 30 days     vitamin E 400 UNIT capsule Take 400 Units by mouth every morning. Vitamin E with Selenium     Current Facility-Administered Medications  Medication Dose Route Frequency Provider Last  Rate Last Admin   bupivacaine (MARCAINE) 0.5 % (with pres) injection 3 mL  3 mL Other Once Tyrell Antonio, MD         Objective: Vital signs in last 24 hours: BP 111/60   Pulse 62   Ht 5\' 10"  (1.778 m)   Wt (!) 300 lb 9.6 oz (136.4 kg)   BMI 43.13 kg/m   Intake/Output from previous day: No intake/output data  recorded. Intake/Output this shift: @IOTHISSHIFT @   Physical Exam Vitals reviewed.  Constitutional:      Appearance: He is obese.     Comments: In a wheelchair.   Abdominal:     Comments: Obese with a large paniculus.   Genitourinary:    Comments: He appears to have been circumcised but has acquired phimosis with a buried penis from obesity.   Scrotum and contents normal. Unable to do a rectal exam.  Neurological:     Mental Status: He is alert.     Lab Results:  Results for orders placed or performed in visit on 06/19/23 (from the past 24 hour(s))  Urinalysis, Routine w reflex microscopic     Status: Abnormal   Collection Time: 06/19/23 10:46 AM  Result Value Ref Range   Specific Gravity, UA 1.010 1.005 - 1.030   pH, UA 6.5 5.0 - 7.5   Color, UA Yellow Yellow   Appearance Ur Clear Clear   Leukocytes,UA 2+ (A) Negative   Protein,UA Negative Negative/Trace   Glucose, UA 3+ (A) Negative   Ketones, UA Negative Negative   RBC, UA Trace (A) Negative   Bilirubin, UA Negative Negative   Urobilinogen, Ur 0.2 0.2 - 1.0 mg/dL   Nitrite, UA Negative Negative   Microscopic Examination See below:    Narrative   Performed at:  [address] [facility] [address], [place], [place]  [id number] Lab Director: Chinita Pester MT, Phone:  [phone]  Microscopic Examination     Status: Abnormal   Collection Time: 06/19/23 10:46 AM   Urine  Result Value Ref Range   WBC, UA >30 (A) 0 - 5 /hpf   RBC, Urine 3-10 (A) 0 - 2 /hpf   Epithelial Cells (non renal) 0-10 0 - 10 /hpf   Bacteria, UA Few (A) None seen/Few   Narrative   Performed at:  [address] - [facility] [address], [place], [place]  [id number] Lab Director: Chinita Pester MT, Phone:  [phone]   *Note: Due to a large number of results and/or encounters for the requested time period, some results have not been displayed. A complete set of results can be found in Results Review.    BMET No results for input(s):  "NA", "K", "CL", "CO2", "GLUCOSE", "BUN", "CREATININE", "CALCIUM" in the last 72 hours. PT/INR No results for input(s): "LABPROT", "INR" in the last 72 hours. ABG No results for input(s): "PHART", "HCO3" in the last 72 hours.  Invalid input(s): "PCO2", "PO2"  Studies/Results: No results found.   Assessment/Plan: History of recurrent UTI's.   He has acquired phimosis with a buried penis so a voided urine will always be contaminated.  It is difficult to say he has truly had UTI's.  He would need a cath UA and culture prior to future UTI treatment.    Acquired phimosis with a buried penis.    He will need to continue to lose weight and if the problem persists after weight loss, he could be considered for reconstructive surgery, but he would need to lose a lot  of weight and possibly have a panniculectomy which would require a tertiary care referral.   No orders of the defined types were placed in this encounter.    Orders Placed This Encounter  Procedures   Urine Culture   Microscopic Examination   Urinalysis, Routine w reflex microscopic     Return if symptoms worsen or fail to improve.    CC: [facility], Dr. Georgina Quint.     Bjorn Pippin 06/20/2023

## 2023-06-21 LAB — URINE CULTURE

## 2023-06-24 ENCOUNTER — Ambulatory Visit (HOSPITAL_BASED_OUTPATIENT_CLINIC_OR_DEPARTMENT_OTHER): Payer: 59 | Admitting: Family

## 2023-06-24 ENCOUNTER — Encounter (HOSPITAL_BASED_OUTPATIENT_CLINIC_OR_DEPARTMENT_OTHER): Payer: Self-pay | Admitting: Family

## 2023-06-24 VITALS — BP 114/62 | HR 66 | Ht 70.0 in | Wt 301.6 lb

## 2023-06-24 DIAGNOSIS — D6859 Other primary thrombophilia: Secondary | ICD-10-CM

## 2023-06-24 DIAGNOSIS — I4821 Permanent atrial fibrillation: Secondary | ICD-10-CM

## 2023-06-24 DIAGNOSIS — N1832 Chronic kidney disease, stage 3b: Secondary | ICD-10-CM | POA: Diagnosis not present

## 2023-06-24 DIAGNOSIS — I890 Lymphedema, not elsewhere classified: Secondary | ICD-10-CM | POA: Diagnosis not present

## 2023-06-24 DIAGNOSIS — I5032 Chronic diastolic (congestive) heart failure: Secondary | ICD-10-CM | POA: Diagnosis not present

## 2023-06-24 MED ORDER — METOLAZONE 5 MG PO TABS: ORAL_TABLET | ORAL | Status: DC

## 2023-06-24 MED ORDER — TORSEMIDE 40 MG PO TABS: 40.0000 mg | ORAL_TABLET | Freq: Two times a day (BID) | ORAL | Status: DC

## 2023-06-24 NOTE — Patient Instructions (Signed)
Medication Instructions:  Your physician has recommended you make the following change in your medication:   Change: Metolazone 5mg  Monday, Wednesday and Friday   Change: Torsemide 40mg  twice daily  *If you need a refill on your cardiac medications before your next appointment, please call your pharmacy*  Follow-Up: At [facility], you and your health needs are our priority.  As part of our continuing mission to provide you with exceptional heart care, we have created designated Provider Care Teams.  These Care Teams include your primary Cardiologist (physician) and Advanced Practice Providers (APPs -  Physician Assistants and Nurse Practitioners) who all work together to provide you with the care you need, when you need it.  We recommend signing up for the patient portal called "MyChart".  Sign up information is provided on this After Visit Summary.  MyChart is used to connect with patients for Virtual Visits (Telemedicine).  Patients are able to view lab/test results, encounter notes, upcoming appointments, etc.  Non-urgent messages can be sent to your provider as well.   To learn more about what you can do with MyChart, go to [email or web address].    Your next appointment:   4-6 months with Dr. [name] or Gillian Shields, NP

## 2023-06-24 NOTE — Progress Notes (Signed)
Cardiology Office Note:  .   Date:  06/27/2023  ID:  [name], DOB 12-10-1950, MRN [id number] PCP: Charlynne Pander, MD  [facility] Providers Cardiologist:  [name], MD Cardiology APP:  Azalee Course, [place]  Nephrology: Dr. Ronalee Belts   History of Present Illness: .   [name] is a 72 y.o. male with a hx of morbid obesity, DM 2, chronic diastolic heart failure, persistent atrial fibrillation, hypertension, bilateral lower extremity lymphedema with chronic wounds.   Admitted 02/2022 with acute on chronic heart failure.  He noted eating a lot of high sodium meals while staying in a hotel waiting to move to a new apartment.  Diuresed with IV Lasix and treated for UTI.  Echo with EF 55 to 60%, RV not well visualized.  RHC with RA 18, PA 63/19 (35), Fick CO/Cl 10.5/3.8, Thermo CO/Cl 7.2/2.6, PVR 1.9 WU (thermo), PAPi 2.4.  There was suspected pulmonary hypertension due to combination of left heart disease and primary pulmonary hypertension related to untreated OSA and obesity.  He had a 78 pound weight loss during admission.  Discharged on torsemide 40 mg daily.  Discharge weight 333 pounds.   Initial TOC visit had gained 20pounds at SNF. Torsemide increased to 80mg  QD. Seen 04/05/22 still volume overloaded. Recommended take dose of metolazone for 2 days then transition to once per week with potassium.    Seen 04/16/22 noted to be taking Metolazone 2.5mg  and Torsemide 80mg  QD. Metolazone reduced to twice per week. Follow up labs 04/23/22 with creatinine 2.74, K 126, GFR 24 recommended for admission.    Hospitalized 6/20-6/22/23 given 500cc bolus and 100 mL/hr normal saline. Metolazone and Torsemide held. He was discharge don Torsemide. Discharge weight 139.7 kg.    At visit 06/17/22 noted SNF had not changed Metolazone from 5mg  to 2.5mg  as recommended by PCP. Kidney function was improved 06/19/22 with creatinine 1.37 with Torsemide 40mg  QD and Metolazone 2.5mg  QD. He saw Dr. [name]  09/20/22 and was stable from a cardiac perspective with no changes made.   Last seen 03/25/23 at which time [place] initiated.    He presents today for follow up independently. Remains at SNF. Weight loss of from  334 ? 301. Following with bariatrics at [facility]. His knees have been "so "so". He is transferring from bed to chair. He continues to work with physical therapy. Notes he has not been sleeping well as his roommate talks in his sleep. Rare ligthheadedness with quick position changes. No near syncope nor syncope. Present regimen from SNF and nephrologist per SNF documentation Metolazone 5mg  M/W/F and Torsemide 40mg  BID.   ROS: Please see the history of present illness.    All other systems reviewed and are negative.   Studies Reviewed: .        Cardiac Studies & Procedures   CARDIAC CATHETERIZATION  CARDIAC CATHETERIZATION 02/19/2022  Narrative Findings:  RA = 18 RV = 60/24 PA =  63/19 (35) PCW = 22 Fick cardiac output/index = 10.5/3.8 Thermo CO/CI = 7.2/2.6 PVR = 1.9 WU (Thermo) FA sat = 92% PA sat = 63%, 65% High SVC = 68% PAPi = 2.4  Assessment: 1. Marked volume overload with R>>L heart failure 2. Preserved cardiac output  Plan/Discussion:  Remains markedly volume overloaded. Consider use of lasix gtt +/- acetazolamide or metolazone to facilitate diuresis. Keep LEs wrapped.  Arvilla Meres, MD 9:20 AM     ECHOCARDIOGRAM  ECHOCARDIOGRAM COMPLETE 02/14/2022  Narrative ECHOCARDIOGRAM REPORT    Patient  Name:   [name] Date of Exam: 02/14/2022 Medical Rec #:  [id number]          Height:       70.0 in Accession #:    [id number]         Weight:       411.2 lb Date of Birth:  1951/10/26           BSA:          2.835 m Patient Age:    70 years           BP:           115/50 mmHg Patient Gender: M                  HR:           60 bpm. Exam Location:  Inpatient  Procedure: 2D Echo and Intracardiac Opacification Agent  Indications:     Cardiomegaly  History:        Patient has prior history of Echocardiogram examinations, most recent 12/29/2017. Arrythmias:Atrial Fibrillation; Risk Factors:Diabetes and Hypertension.  Sonographer:    Eduard Roux Referring Phys: [id number] Kieth Brightly MACNEIL   Sonographer Comments: Patient is morbidly obese. Image acquisition challenging due to patient body habitus. IMPRESSIONS   1. Left ventricular ejection fraction, by estimation, is 55 to 60%. The left ventricle has normal function. The left ventricle has no regional wall motion abnormalities. There is moderate left ventricular hypertrophy. Left ventricular diastolic function could not be evaluated. 2. Right ventricular systolic function was not well visualized. The right ventricular size is not well visualized. 3. Left atrial size was mildly dilated. 4. The mitral valve is normal in structure. No evidence of mitral valve regurgitation. 5. The aortic valve is normal in structure. There is mild calcification of the aortic valve. Aortic valve regurgitation is trivial.  FINDINGS Left Ventricle: Left ventricular ejection fraction, by estimation, is 55 to 60%. The left ventricle has normal function. The left ventricle has no regional wall motion abnormalities. The left ventricular internal cavity size was normal in size. There is moderate left ventricular hypertrophy. Left ventricular diastolic function could not be evaluated due to atrial fibrillation. Left ventricular diastolic function could not be evaluated.  Right Ventricle: The right ventricular size is not well visualized. Right vetricular wall thickness was not well visualized. Right ventricular systolic function was not well visualized.  Left [facility]: Left atrial size was mildly dilated.  Right [facility]: Right atrial size was normal in size.  Pericardium: There is no evidence of pericardial effusion.  Mitral Valve: The mitral valve is normal in structure. No evidence of mitral  valve regurgitation.  Tricuspid Valve: The tricuspid valve is grossly normal. Tricuspid valve regurgitation is mild.  Aortic Valve: The aortic valve is normal in structure. There is mild calcification of the aortic valve. Aortic valve regurgitation is trivial. Aortic valve peak gradient measures 6.3 mmHg.  Pulmonic Valve: The pulmonic valve was normal in structure. Pulmonic valve regurgitation is not visualized.  Aorta: The aortic root and ascending aorta are structurally normal, with no evidence of dilitation.  IAS/Shunts: The interatrial septum was not well visualized.   LEFT VENTRICLE PLAX 2D LVIDd:         5.10 cm   Diastology LVIDs:         3.70 cm   LV e' medial:    7.20 cm/s LV PW:         1.30 cm   LV E/e'  medial:  17.6 LV IVS:        1.40 cm   LV e' lateral:   3.53 cm/s LVOT diam:     2.00 cm   LV E/e' lateral: 36.0 LV SV:         66 LV SV Index:   23 LVOT Area:     3.14 cm   IVC IVC diam: 3.70 cm  LEFT ATRIUM              Index LA diam:        4.80 cm  1.69 cm/m LA Vol (A2C):   108.0 ml 38.09 ml/m LA Vol (A4C):   75.4 ml  26.58 ml/m LA Biplane Vol: 107.0 ml 37.74 ml/m AORTIC VALVE                 PULMONIC VALVE AV Area (Vmax): 2.43 cm     PV Vmax:       0.90 m/s AV Vmax:        125.50 cm/s  PV Peak grad:  3.2 mmHg AV Peak Grad:   6.3 mmHg LVOT Vmax:      97.00 cm/s LVOT Vmean:     57.300 cm/s LVOT VTI:       0.211 m  AORTA Ao Root diam: 3.20 cm Ao Asc diam:  3.60 cm  MITRAL VALVE                TRICUSPID VALVE MV Area (PHT): 4.99 cm     TR Peak grad:   38.7 mmHg MV Decel Time: 152 msec     TR Vmax:        311.00 cm/s MV E velocity: 127.00 cm/s MV A velocity: 36.50 cm/s   SHUNTS MV E/A ratio:  3.48         Systemic VTI:  0.21 m Systemic Diam: 2.00 cm  Kristeen Miss MD Electronically signed by Kristeen Miss MD Signature Date/Time: 02/14/2022/5:04:52 PM    Final             Risk Assessment/Calculations:    CHA2DS2-VASc Score = 4   This  indicates a 4.8% annual risk of stroke. The patient's score is based upon: CHF History: 1 HTN History: 1 Diabetes History: 1 Stroke History: 0 Vascular Disease History: 0 Age Score: 1 Gender Score: 0            Physical Exam:   VS:  BP 114/62   Pulse 66   Ht 5\' 10"  (1.778 m)   Wt (!) 301 lb 9.6 oz (136.8 kg)   BMI 43.28 kg/m    Wt Readings from Last 3 Encounters:  06/27/23 298 lb 8.1 oz (135.4 kg)  06/24/23 (!) 301 lb 9.6 oz (136.8 kg)  06/19/23 (!) 300 lb 9.6 oz (136.4 kg)    GEN: Well nourished, overweight, well developed in no acute distress NECK: No JVD; No carotid bruits CARDIAC: IRIR, no murmurs, rubs, gallops RESPIRATORY:  Clear to auscultation without rales, wheezing or rhonchi  ABDOMEN: Soft, non-tender, non-distended EXTREMITIES:  No edema; No deformity. Nonpitting bilateral LE edema. Bilateral LE with Unna boots.  ASSESSMENT AND PLAN: .   HFpEF / Right sided heart failure / Pulmonary hypertension -. Volume status difficult to ascertain due to body habitus. Congratulated on significant weight loss since last visit. Bilateral LE in compression wrap with nonpitting edema on exam. Per SNF records taking Torsemide 40mg  BID and metolazone 5 mg M/W/F. Per his report has had nephrology input, as such will  continue same. Additional GDMT Farxiga 10 mg daily.   Snores - Sleep study when discharged from SNF. Given RHC and body habitus, plan for in lab split night sleep study. Coordinate at follow up as still at SNF and mobility not yet adequate to transfer to bed independently.    Longstanding persistent atrial fibrillation -asymptomatic in regards to atrial fibrillation with no palpitations.  Rate controlled today continue diltiazem 120 mg daily.  Continue Eliquis 5 mg twice daily.  Denies bleeding complications.  Does not meet dose reduction criteria.  12/23/2022 hemoglobin 14.5.   CKDIIIb - Careful titration of diuretic and antihypertensive.   DM2 - Currently managed at SNF.  Insulin dependent.  Also on Trulicity per NP at SNF. Agree with bariatric recommendation to increase Trulicity to help facilitate further weight loss.    HTN - Now with relative hypotension though asymptomatic with no lightheadedness. Continue present antihypertensive regimen.    Lymphedema- Bilateral LE compression wraps in place.     OA - Limits mobility. Focused on weight loss to hopefully qualify for surgery.        Dispo: follow up in 4-6 months  Signed, Alver Sorrow, NP

## 2023-06-25 ENCOUNTER — Encounter (HOSPITAL_BASED_OUTPATIENT_CLINIC_OR_DEPARTMENT_OTHER): Payer: Self-pay | Admitting: Family

## 2023-06-25 ENCOUNTER — Telehealth: Payer: Self-pay | Admitting: Internal Medicine

## 2023-06-25 NOTE — Telephone Encounter (Signed)
Office calling to ask a question about Trulicity. Please advise

## 2023-06-25 NOTE — Telephone Encounter (Signed)
Spoke with facility who was asking about clarity on the Trulicity dose on the sheets received from visit yesterday. Per facility patients instructions were that [facility] agreed with bariatrics of titration of Trulicity for weight loss. Explained to RN that [name] did not make any changes nor did she prescribe [name] was only agreeing with bariatrics recommendations. RN stated patient has been on same dose of Trulicity since April. Advised would forward to [employer] NP for review

## 2023-06-25 NOTE — Telephone Encounter (Signed)
Left message to call back  

## 2023-06-25 NOTE — Telephone Encounter (Signed)
error 

## 2023-06-26 ENCOUNTER — Inpatient Hospital Stay (HOSPITAL_COMMUNITY)
Admission: EM | Admit: 2023-06-26 | Discharge: 2023-06-30 | DRG: 683 | Disposition: A | Discharge status: Skilled Nursing Facility | Payer: 59 | Source: Skilled Nursing Facility | Attending: Internal Medicine | Admitting: Internal Medicine

## 2023-06-26 ENCOUNTER — Emergency Department (HOSPITAL_COMMUNITY): Payer: 59

## 2023-06-26 ENCOUNTER — Encounter (HOSPITAL_COMMUNITY): Payer: Self-pay

## 2023-06-26 ENCOUNTER — Other Ambulatory Visit: Payer: Self-pay

## 2023-06-26 DIAGNOSIS — I130 Hypertensive heart and chronic kidney disease with heart failure and stage 1 through stage 4 chronic kidney disease, or unspecified chronic kidney disease: Secondary | ICD-10-CM | POA: Diagnosis present

## 2023-06-26 DIAGNOSIS — G4733 Obstructive sleep apnea (adult) (pediatric): Secondary | ICD-10-CM | POA: Diagnosis present

## 2023-06-26 DIAGNOSIS — L89312 Pressure ulcer of right buttock, stage 2: Secondary | ICD-10-CM | POA: Diagnosis present

## 2023-06-26 DIAGNOSIS — E861 Hypovolemia: Secondary | ICD-10-CM | POA: Diagnosis present

## 2023-06-26 DIAGNOSIS — N179 Acute kidney failure, unspecified: Principal | ICD-10-CM | POA: Diagnosis present

## 2023-06-26 DIAGNOSIS — Z8614 Personal history of Methicillin resistant Staphylococcus aureus infection: Secondary | ICD-10-CM

## 2023-06-26 DIAGNOSIS — E1165 Type 2 diabetes mellitus with hyperglycemia: Secondary | ICD-10-CM

## 2023-06-26 DIAGNOSIS — E871 Hypo-osmolality and hyponatremia: Secondary | ICD-10-CM | POA: Diagnosis present

## 2023-06-26 DIAGNOSIS — E1122 Type 2 diabetes mellitus with diabetic chronic kidney disease: Secondary | ICD-10-CM | POA: Diagnosis present

## 2023-06-26 DIAGNOSIS — N1832 Chronic kidney disease, stage 3b: Secondary | ICD-10-CM | POA: Diagnosis present

## 2023-06-26 DIAGNOSIS — I10 Essential (primary) hypertension: Secondary | ICD-10-CM | POA: Diagnosis not present

## 2023-06-26 DIAGNOSIS — Z7985 Long-term (current) use of injectable non-insulin antidiabetic drugs: Secondary | ICD-10-CM

## 2023-06-26 DIAGNOSIS — G8929 Other chronic pain: Secondary | ICD-10-CM | POA: Diagnosis present

## 2023-06-26 DIAGNOSIS — Z66 Do not resuscitate: Secondary | ICD-10-CM | POA: Diagnosis present

## 2023-06-26 DIAGNOSIS — L89322 Pressure ulcer of left buttock, stage 2: Secondary | ICD-10-CM | POA: Diagnosis present

## 2023-06-26 DIAGNOSIS — Z833 Family history of diabetes mellitus: Secondary | ICD-10-CM

## 2023-06-26 DIAGNOSIS — I5032 Chronic diastolic (congestive) heart failure: Secondary | ICD-10-CM | POA: Diagnosis present

## 2023-06-26 DIAGNOSIS — I890 Lymphedema, not elsewhere classified: Secondary | ICD-10-CM | POA: Diagnosis present

## 2023-06-26 DIAGNOSIS — Z6841 Body Mass Index (BMI) 40.0 and over, adult: Secondary | ICD-10-CM

## 2023-06-26 DIAGNOSIS — E876 Hypokalemia: Secondary | ICD-10-CM | POA: Diagnosis present

## 2023-06-26 DIAGNOSIS — R519 Headache, unspecified: Secondary | ICD-10-CM

## 2023-06-26 DIAGNOSIS — Z888 Allergy status to other drugs, medicaments and biological substances status: Secondary | ICD-10-CM

## 2023-06-26 DIAGNOSIS — Z7901 Long term (current) use of anticoagulants: Secondary | ICD-10-CM

## 2023-06-26 DIAGNOSIS — I4819 Other persistent atrial fibrillation: Secondary | ICD-10-CM | POA: Diagnosis present

## 2023-06-26 DIAGNOSIS — Z79899 Other long term (current) drug therapy: Secondary | ICD-10-CM

## 2023-06-26 DIAGNOSIS — G2581 Restless legs syndrome: Secondary | ICD-10-CM | POA: Diagnosis present

## 2023-06-26 DIAGNOSIS — I493 Ventricular premature depolarization: Secondary | ICD-10-CM | POA: Diagnosis present

## 2023-06-26 DIAGNOSIS — Z794 Long term (current) use of insulin: Secondary | ICD-10-CM

## 2023-06-26 LAB — URINALYSIS, ROUTINE W REFLEX MICROSCOPIC
Bilirubin Urine: NEGATIVE
Glucose, UA: >=500 mg/dL — AB
Ketones, ur: NEGATIVE mg/dL
Nitrite: NEGATIVE
Protein, ur: NEGATIVE mg/dL
Specific Gravity, Urine: 1.007 (ref 1.005–1.030)
pH: 6 (ref 5.0–8.0)

## 2023-06-26 LAB — GLUCOSE, CAPILLARY
Glucose-Capillary: 261 mg/dL — ABNORMAL HIGH (ref 70–99)
Glucose-Capillary: 272 mg/dL — ABNORMAL HIGH (ref 70–99)

## 2023-06-26 LAB — CBC WITH DIFFERENTIAL/PLATELET
Abs Immature Granulocytes: 0.04 10*3/uL (ref 0.00–0.07)
Basophils Absolute: 0.1 10*3/uL (ref 0.0–0.1)
Basophils Relative: 1 %
Eosinophils Absolute: 0.3 10*3/uL (ref 0.0–0.5)
Eosinophils Relative: 3 %
HCT: 51.3 % (ref 39.0–52.0)
Hemoglobin: 16.5 g/dL (ref 13.0–17.0)
Immature Granulocytes: 0 %
Lymphocytes Relative: 12 %
Lymphs Abs: 1.2 10*3/uL (ref 0.7–4.0)
MCH: 24.2 pg — ABNORMAL LOW (ref 26.0–34.0)
MCHC: 32.2 g/dL (ref 30.0–36.0)
MCV: 75.2 fL — ABNORMAL LOW (ref 80.0–100.0)
Monocytes Absolute: 1.1 10*3/uL — ABNORMAL HIGH (ref 0.1–1.0)
Monocytes Relative: 11 %
Neutro Abs: 7.3 10*3/uL (ref 1.7–7.7)
Neutrophils Relative %: 73 %
Platelets: 259 10*3/uL (ref 150–400)
RBC: 6.82 MIL/uL — ABNORMAL HIGH (ref 4.22–5.81)
RDW: 19.9 % — ABNORMAL HIGH (ref 11.5–15.5)
WBC: 9.9 10*3/uL (ref 4.0–10.5)
nRBC: 0 % (ref 0.0–0.2)

## 2023-06-26 LAB — BRAIN NATRIURETIC PEPTIDE: B Natriuretic Peptide: 108 pg/mL — ABNORMAL HIGH (ref 0.0–100.0)

## 2023-06-26 LAB — BASIC METABOLIC PANEL
Anion gap: 16 — ABNORMAL HIGH (ref 5–15)
BUN: 84 mg/dL — ABNORMAL HIGH (ref 8–23)
CO2: 31 mmol/L (ref 22–32)
Calcium: 8 mg/dL — ABNORMAL LOW (ref 8.9–10.3)
Chloride: 78 mmol/L — ABNORMAL LOW (ref 98–111)
Creatinine, Ser: 3.47 mg/dL — ABNORMAL HIGH (ref 0.61–1.24)
GFR, Estimated: 18 mL/min — ABNORMAL LOW (ref >60)
Glucose, Bld: 353 mg/dL — ABNORMAL HIGH (ref 70–99)
Potassium: 2.5 mmol/L — CL (ref 3.5–5.1)
Sodium: 125 mmol/L — ABNORMAL LOW (ref 135–145)

## 2023-06-26 LAB — PHOSPHORUS: Phosphorus: 3.8 mg/dL (ref 2.5–4.6)

## 2023-06-26 LAB — MRSA NEXT GEN BY PCR, NASAL: MRSA by PCR Next Gen: DETECTED — AB

## 2023-06-26 LAB — MAGNESIUM: Magnesium: 2.3 mg/dL (ref 1.7–2.4)

## 2023-06-26 LAB — HEMOGLOBIN A1C
Hgb A1c MFr Bld: 8.4 % — ABNORMAL HIGH (ref 4.8–5.6)
Mean Plasma Glucose: 194.38 mg/dL

## 2023-06-26 MED ORDER — BUTALBITAL-APAP-CAFF-COD 50-325-40-30 MG PO CAPS: 1.0000 | ORAL_CAPSULE | Freq: Four times a day (QID) | ORAL | Status: DC | PRN

## 2023-06-26 MED ORDER — INSULIN ASPART 100 UNIT/ML IJ SOLN
0.0000 [IU] | Freq: Every day | INTRAMUSCULAR | Status: DC
  Administered 2023-06-26 – 2023-06-27 (×2): 3 [IU] via SUBCUTANEOUS
  Administered 2023-06-28: 2 [IU] via SUBCUTANEOUS

## 2023-06-26 MED ORDER — ONDANSETRON HCL 4 MG PO TABS: 4.0000 mg | ORAL_TABLET | Freq: Four times a day (QID) | ORAL | Status: DC | PRN

## 2023-06-26 MED ORDER — FEBUXOSTAT 40 MG PO TABS
40.0000 mg | ORAL_TABLET | Freq: Every day | ORAL | Status: DC
  Administered 2023-06-27 – 2023-06-30 (×4): 40 mg via ORAL
  Filled 2023-06-26 (×4): qty 1

## 2023-06-26 MED ORDER — ONDANSETRON HCL 4 MG/2ML IJ SOLN
4.0000 mg | Freq: Four times a day (QID) | INTRAMUSCULAR | Status: DC | PRN
  Administered 2023-06-27: 4 mg via INTRAVENOUS
  Filled 2023-06-26: qty 2

## 2023-06-26 MED ORDER — POTASSIUM CHLORIDE 10 MEQ/100ML IV SOLN
10.0000 meq | INTRAVENOUS | Status: AC
  Administered 2023-06-26 (×4): 10 meq via INTRAVENOUS
  Filled 2023-06-26 (×4): qty 100

## 2023-06-26 MED ORDER — SODIUM CHLORIDE 0.9 % IV SOLN: INTRAVENOUS | Status: AC

## 2023-06-26 MED ORDER — ACETAMINOPHEN 650 MG RE SUPP: 650.0000 mg | Freq: Four times a day (QID) | RECTAL | Status: DC | PRN

## 2023-06-26 MED ORDER — ADULT MULTIVITAMIN W/MINERALS CH
1.0000 | ORAL_TABLET | Freq: Every day | ORAL | Status: DC
  Administered 2023-06-27 – 2023-06-30 (×4): 1 via ORAL
  Filled 2023-06-26 (×4): qty 1

## 2023-06-26 MED ORDER — POTASSIUM CHLORIDE CRYS ER 20 MEQ PO TBCR
40.0000 meq | EXTENDED_RELEASE_TABLET | Freq: Once | ORAL | Status: AC
  Administered 2023-06-26: 40 meq via ORAL
  Filled 2023-06-26: qty 2

## 2023-06-26 MED ORDER — ACETAMINOPHEN 325 MG PO TABS
650.0000 mg | ORAL_TABLET | Freq: Four times a day (QID) | ORAL | Status: DC | PRN
  Administered 2023-06-28: 650 mg via ORAL
  Filled 2023-06-26 (×2): qty 2

## 2023-06-26 MED ORDER — BUTALBITAL-APAP-CAFFEINE 50-325-40 MG PO TABS
1.0000 | ORAL_TABLET | Freq: Four times a day (QID) | ORAL | Status: DC | PRN
  Administered 2023-06-26: 2 via ORAL
  Administered 2023-06-28 – 2023-06-29 (×3): 1 via ORAL
  Filled 2023-06-26: qty 2
  Filled 2023-06-26 (×3): qty 1

## 2023-06-26 MED ORDER — INSULIN GLARGINE-YFGN 100 UNIT/ML ~~LOC~~ SOLN
20.0000 [IU] | Freq: Every day | SUBCUTANEOUS | Status: DC
  Administered 2023-06-26 – 2023-06-27 (×2): 20 [IU] via SUBCUTANEOUS
  Filled 2023-06-26 (×3): qty 0.2

## 2023-06-26 MED ORDER — INSULIN ASPART 100 UNIT/ML IJ SOLN
0.0000 [IU] | Freq: Three times a day (TID) | INTRAMUSCULAR | Status: DC
  Administered 2023-06-26 – 2023-06-27 (×3): 5 [IU] via SUBCUTANEOUS
  Administered 2023-06-27 – 2023-06-29 (×5): 3 [IU] via SUBCUTANEOUS
  Administered 2023-06-29: 2 [IU] via SUBCUTANEOUS

## 2023-06-26 MED ORDER — ORAL CARE MOUTH RINSE: 15.0000 mL | OROMUCOSAL | Status: DC | PRN

## 2023-06-26 MED ORDER — DILTIAZEM HCL ER COATED BEADS 120 MG PO CP24
120.0000 mg | ORAL_CAPSULE | Freq: Every day | ORAL | Status: DC
  Administered 2023-06-27 – 2023-06-30 (×4): 120 mg via ORAL
  Filled 2023-06-26 (×4): qty 1

## 2023-06-26 MED ORDER — DILTIAZEM HCL ER BEADS 120 MG PO CP24: 120.0000 mg | ORAL_CAPSULE | Freq: Every day | ORAL | Status: DC

## 2023-06-26 MED ORDER — SODIUM CHLORIDE 0.9 % IV SOLN: Freq: Once | INTRAVENOUS | Status: AC

## 2023-06-26 MED ORDER — SENNOSIDES-DOCUSATE SODIUM 8.6-50 MG PO TABS
1.0000 | ORAL_TABLET | Freq: Every day | ORAL | Status: DC
  Administered 2023-06-26 – 2023-06-29 (×4): 1 via ORAL
  Filled 2023-06-26 (×4): qty 1

## 2023-06-26 MED ORDER — LORATADINE 10 MG PO TABS
10.0000 mg | ORAL_TABLET | Freq: Every day | ORAL | Status: DC
  Administered 2023-06-26 – 2023-06-29 (×4): 10 mg via ORAL
  Filled 2023-06-26 (×5): qty 1

## 2023-06-26 MED ORDER — TERAZOSIN HCL 5 MG PO CAPS
10.0000 mg | ORAL_CAPSULE | Freq: Every day | ORAL | Status: DC
  Administered 2023-06-26 – 2023-06-29 (×4): 10 mg via ORAL
  Filled 2023-06-26 (×4): qty 2

## 2023-06-26 NOTE — ED Notes (Signed)
Attempted to call report, nurse will call back for handoff

## 2023-06-26 NOTE — ED Triage Notes (Signed)
BIBA from [facility] c/o bun 80 on labs 2 days ago.  Cbg-400 with ems  Hx DM, CHF and follows [facility] for CKD stg3 DNR noted on paperwork.  Patient denies pain.

## 2023-06-26 NOTE — Assessment & Plan Note (Addendum)
-  A1c 8.4 -Will continue holding [place] and Trulicity in the setting of worsening renal failure -Continue sliding scale insulin and Semglee -Follow CBG fluctuation and adjust management as required.

## 2023-06-26 NOTE — Assessment & Plan Note (Addendum)
-  Continue as needed Fioricet. -No headaches currently.

## 2023-06-26 NOTE — Assessment & Plan Note (Addendum)
-  Continue to replete electrolytes and follow trend -Continue telemetry monitoring -Maintain adequate hydration.

## 2023-06-26 NOTE — H&P (Signed)
History and Physical    Patient: [name] WVP:[id number] DOB: 07-26-1951 DOA: 06/26/2023 DOS: the patient was seen and examined on 06/26/2023 PCP: Charlynne Pander, MD  Patient coming from: SNF  Chief Complaint:  Chief Complaint  Patient presents with   abnormal labs   HPI: [name] is a 72 y.o. 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  Patient reports having blood work in anticipation to visit his nephrology when he was instructed to come to the emergency department for further evaluation and management.  Patient is currently denying chest pain, shortness of breath, nausea, vomiting, abdominal pain, dysuria, hematuria, melena, hematochezia, focal weaknesses, sick contacts or any other complaints.  Patient reports approximately 1 week prior to admission he was experiencing increased short winded sensation and weight gain and his diuretic regimen was adjusted.  Since then symptoms has resolved.  Abnormal blood work demonstrated significant elevation in his BUN and acute on chronic renal failure with hyponatremia.  TRH has been consulted to place patient in the hospital for further evaluation and management.   Review of Systems: As mentioned in the history of present illness. All other systems reviewed and are negative. Past Medical History:  Diagnosis Date   ALLERGIC RHINITIS 08/11/2006   ASTHMA 08/11/2006   Bradycardia    Beta blocker d/c'd 2022   Chronic atrial fibrillation (HCC) 08/2014   Chronic combined systolic and diastolic CHF (congestive heart failure) [facility])    Cardiology f/u 12/2017: pt volume overloaded (R heart dysf suspected), BNP very high, lasix increased.  Repeat echo 12/2017: normal LV EF, mild DD, +RV syst  dysfxn, mod pulm HTN, biatrial enlgmt.   Chronic constipation    Chronic renal insufficiency, stage 3 (moderate) (HCC)    Colon cancer screening 02/2021   02/2021 Cologuard POS->GI ref   DIABETES MELLITUS, TYPE II 08/11/2006   HYPERTENSION 08/11/2006   IDA (iron deficiency anemia)    04/2022.  Iron started.  Needs hemoccults   Impaired mobility and endurance    MRSA infection 06/2018   LL venous stasis ulcer infected   Normocytic anemia 2016-2019   03/2018 B12 normal, iron ok (ferritin borderline low).   OBESITY, MORBID 12/14/2007   saxenda started 05/2020 by [facility] wt mgmt center   OSA (obstructive sleep apnea)    to get sleep study with [facility]) as of 12/03/2018 consult.   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  [id number] ID started him on amoxil prophylaxis---ineffective.  End 2018/Jan 2019 penicillin G injections prophyl helpful but pt declined to continue this as of 01/2018 ID f/u.  ID talked him into resuming monthly penicillin G as of 02/2018 f/u.     Restless leg syndrome    Rx'd clonazepam 09/2017 and pt refused to take it after reading the medication's potential side effects.   Venous stasis ulcers of both lower extremities (HCC)    Severe lymphedema.  wound clinic care ongoing as of 01/2018   Past Surgical History:  Procedure Laterality Date   Carotid dopplers  07/23/2018   Left NORMAL.  Right 1-39% ICA stenosis, with <50% distal CCA stenosis (not hemodynamically significant)   LE venous dopplers Bilateral 03/06/2022   no dvt   RIGHT HEART CATH N/A 02/19/2022   Marked volume overload,  R>>L HF, preserved CO. Procedure: RIGHT HEART CATH;  Surgeon: Dolores Patty, MD;  Location: [facility] INVASIVE CV LAB;  Service: Cardiovascular;  Laterality: N/A;   TRANSTHORACIC ECHOCARDIOGRAM  11/2007; 09/2014; 11/2015;12/2017   LV fxn normal, EF normal, mild dilation of left [facility].  2015 grade II diast dysfxn.  2017  EF 55-60%. 2019: LVEF 60-65%, mild RV syst dysf,biatrial enlgmt, mod pulm htn. 02/14/22 EF 55-60%, unable to assess diast fxn, LVH, o/w normal   URETERAL STENT PLACEMENT     virtual colonoscopy  01/2011   Normal   Social History:  reports that he has never smoked. He has never been exposed to tobacco smoke. He has never used smokeless tobacco. He reports that he does not drink alcohol and does not use drugs.  Allergies  Allergen Reactions   Other Other (See Comments)    Sneezing, coughing   Hydralazine Other (See Comments)    Drowsiness/sedation/mental fog    Family History  Problem Relation Age of Onset   Diabetes Mother    Diabetes Sister    Hydrocephalus Sister        NPH    Prior to Admission medications   Medication Sig Start Date End Date Taking? Authorizing Provider  apixaban (ELIQUIS) 5 MG TABS tablet Take 5 mg by mouth 2 (two) times daily.   Yes [provider]  b complex vitamins capsule Take 1 capsule by mouth every morning.   Yes [provider]  cetirizine (ZYRTEC) 10 MG tablet Take 10 mg by mouth at bedtime.   Yes [provider]  dapagliflozin propanediol (FARXIGA) 10 MG TABS tablet Take 1 tablet (10 mg total) by mouth daily before breakfast. 03/25/23  Yes Alver Sorrow, NP  diltiazem (TIAZAC) 120 MG 24 hr capsule Take 1 capsule (120 mg total) by mouth daily. 09/14/21  Yes McGowen, Maryjean Morn, MD  docusate sodium (COLACE) 100 MG capsule Take 100 mg by mouth 2 (two) times daily.   Yes [provider]  febuxostat (ULORIC) 40 MG tablet Take 40 mg by mouth daily. 01/31/23  Yes [provider]  ferrous sulfate 325 (65 FE) MG tablet Take 325 mg by mouth daily with breakfast.   Yes [provider]  HYDROcodone-acetaminophen (NORCO/VICODIN) 5-325 MG tablet Take 1-2 tablets by mouth every 6 (six) hours as needed for moderate pain. 1-2 tabs po bid prn pain Patient taking differently: Take 1 tablet by mouth at bedtime. 1-2  tabs po bid prn pain 03/06/22  Yes Zannie Cove, MD  Insulin Lispro Prot & Lispro (HUMALOG 75/25 MIX) (75-25) 100 UNIT/ML Kwikpen Inject 38 Units into the skin 2 (two) times daily. 08/06/22  Yes [provider]  lidocaine (LIDODERM) 5 % Place 1 patch onto the skin daily. Remove & Discard patch within 12 hours or as directed by MD, use as needed for pain relief.   Yes [provider]  metolazone (ZAROXOLYN) 5 MG tablet Take on Monday, Wednesday and Friday with Torsemide 06/24/23  Yes Alver Sorrow, NP  miconazole (MICOTIN) 2 % powder Apply 1 Application topically in the morning and at bedtime. To abdominal fold.   Yes [provider]  potassium chloride SA (KLOR-CON M20) 20 MEQ tablet Take one tablet three times per week. Patient taking differently: Take 20 mEq by mouth every Monday, Wednesday, and Friday. Take one tablet three times per week. 04/16/22  Yes Alver Sorrow, NP  terazosin (HYTRIN) 10 MG capsule Take 10 mg by mouth at bedtime.  Yes [provider]  testosterone cypionate (DEPOTESTOSTERONE CYPIONATE) 200 MG/ML injection Inject 0.5 mLs into the muscle once a week. On Friday.   Yes [provider]  Torsemide 40 MG TABS Take 40 mg by mouth 2 (two) times daily. 06/24/23  Yes Alver Sorrow, NP  traMADol (ULTRAM) 50 MG tablet Take 50 mg by mouth in the morning, at noon, and at bedtime.   Yes [provider]  TRULICITY 1.5 MG/0.5ML SOPN Inject 0.5 mLs into the skin once a week. On Monday.   Yes [provider]  glucose blood (ONETOUCH ULTRA) test strip CHECK BLOOD SUGAR TWICE  DAILY 11/16/21   McGowen, Maryjean Morn, MD  Insulin Pen Needle (B-D ULTRAFINE III SHORT PEN) 31G X 8 MM MISC USE 1 PENNEEDLE TWICE DAILY 07/18/21   [name], MD    Physical Exam: Vitals:   06/26/23 1345 06/26/23 1400 06/26/23 1430 06/26/23 1514  BP: (!) 108/55 (!) 113/54 121/89 106/64  Pulse: 66 79 77 76  Resp: 17 19 18 18   Temp:   98.5 F  (36.9 C) (!) 97.5 F (36.4 C)  TempSrc:    Oral  SpO2: 95% 95% 95% 96%  Weight:    (!) 136.3 kg  Height:    5\' 10"  (1.778 m)   General exam: Alert, awake, oriented x 3; no chest pain, no nausea, no vomiting.  Patient denies shortness of breath. Respiratory system: Good air movement bilaterally; no using accessory muscle. Cardiovascular system:Rate controlled, no rubs, no gallops, unable to assess JVD with body habitus. Gastrointestinal system: Abdomen is obese, nondistended, soft and nontender. No organomegaly or masses felt. Normal bowel sounds heard. Central nervous system: No focal neurological deficits. Extremities: No cyanosis or clubbing; bilateral lymphedema appreciated on exam. Skin: No petechiae.  Stage II pressure injury appreciated on his buttocks bilaterally; no signs of superimposed infection.  Present at time of admission. Psychiatry: Judgement and insight appear normal. Mood & affect appropriate.    Data Reviewed: Basic metabolic panel: Sodium 125, potassium 2.5, chloride 78, CO2 31, glucose 353, BUN 84, creatinine 3.47 and GFR 18 -CBC: White blood cells 9.9, hemoglobin 16.5 and platelet count 259K Magnesium: 2.3 BNP: 108 (best ever)  Assessment and Plan: * Acute renal failure superimposed on stage 3b chronic kidney disease, unspecified acute renal failure type (HCC) -In the setting of diuretic usage after receiving adjustment to treat exacerbation of his underlying CHF. -Patient reports no shortness of breath on examination is a slightly on the dry side. -Will provide judicious fluid resuscitation -Avoid nephrotoxic agents, contrast and hypotension -Follow daily weights/strict I's and O's, check renal ultrasound, urinalysis and follow renal function trend. -Based on patient results will discuss with nephrology service.  Persistent atrial fibrillation (HCC) -Rate controlled -Continue the use of Cardizem. -Holding Eliquis in the setting of worsening renal  failure -Heparin for DVT prophylaxis with provide -Continue telemetry monitoring.  Chronic diastolic CHF (congestive heart failure) (HCC) -Appears stable and compensated currently -Holding diuretics in the setting of acute renal failure. -Heart healthy/low-sodium diet recommended -Follow daily weights and strict intake and output.  Hypertension, essential -Continue treatment with Cardizem and hytrin -Follow vital signs and avoid hypotension -Heart healthy diet discussed with patient.  Uncontrolled type 2 diabetes mellitus with hyperglycemia, with long-term current use of insulin (HCC) -Update A1c -Holding Farxiga and Trulicity in the setting of worsening renal failure -Sliding scale insulin and adjust the dose of Semglee has been initiated while inpatient -Follow CBG fluctuation and adjust management  as required.  Hypokalemia -Replete electrolytes and follow trend -Telemetry monitoring in place -Magnesium checked and within normal limits. -Will be judicious with aggressive repletion in the setting of worsening renal function to minimize the chances of hyperkalemia.  Chronic headaches -Continue as needed Fioricet.  Morbid obesity due to excess calories (HCC) -Body mass index is 43.1 kg/m. -Low-calorie diet, portion control and increase physical activity discussed with patient.  Stage II pressure injury -Present at time of admission without signs of superimposed infection -Affecting bilateral buttocks area -Continue preventive measures and constant repositioning.    Advance Care Planning:   Code Status: DNR   Consults: None  Family Communication: No family at bedside.  Severity of Illness: The appropriate patient status for this patient is OBSERVATION. Observation status is judged to be reasonable and necessary in order to provide the required intensity of service to ensure the patient's safety. The patient's presenting symptoms, physical exam findings, and initial  radiographic and laboratory data in the context of their medical condition is felt to place them at decreased risk for further clinical deterioration. Furthermore, it is anticipated that the patient will be medically stable for discharge from the hospital within 2 midnights of admission.   Author: Vassie Loll, MD 06/26/2023 6:27 PM  For on call review [email or web address].

## 2023-06-26 NOTE — Telephone Encounter (Signed)
He is having good weight loss on Trulicity. Often see better weight loss on higher doses. Recommend provider at his facility increase Trulicity to 3mg  dose to further facilitate weight loss if they feel appropriate. However, as it will requiring down-titration of his insulin so will defer to the discretion of the facility provider.  Alver Sorrow, NP

## 2023-06-26 NOTE — Assessment & Plan Note (Addendum)
-  Continue treatment with Cardizem and hytrin -Continue to follow vital signs and avoid hypotension -Heart healthy diet discussed with patient. -Vital signs appears to be stable.

## 2023-06-26 NOTE — ED Provider Notes (Signed)
[facility] AT [facility] Provider Note   CSN: [id number] Arrival date & time: 06/26/23  1219     History  Chief Complaint  Patient presents with   abnormal labs    [name] is a 72 y.o. male.  He has a history of CKD and diastolic heart failure.  He recently had his diuretics adjusted.  He was getting some outpatient labs done for his upcoming nephrology appointment and they found his BUN to be critically high.  He denies any complaints.  He has continued edema in his legs.  No shortness of breath fevers nausea vomiting diarrhea or urinary symptoms.  He said he was very surprised that he needed to come to the hospital.  The history is provided by the patient.       Home Medications Prior to Admission medications   Medication Sig Start Date End Date Taking? Authorizing Provider  apixaban (ELIQUIS) 5 MG TABS tablet Take 5 mg by mouth 2 (two) times daily.    [provider]  b complex vitamins capsule Take 1 capsule by mouth every morning.    [provider]  butalbital-apap-caffeine-codeine (FIORICET WITH CODEINE) 50-325-40-30 MG capsule TAKE 1 TO 2 CAPSULES BY MOUTH EVERY 6 HOURS AS NEEDED FOR HEADACHES. NOT TO EXCEED 6 CAPSULES PER DAY 11/27/22   McGowen, Maryjean Morn, MD  cetirizine (ZYRTEC) 10 MG tablet Take 10 mg by mouth at bedtime.    [provider]  dapagliflozin propanediol (FARXIGA) 10 MG TABS tablet Take 1 tablet (10 mg total) by mouth daily before breakfast. 03/25/23   Alver Sorrow, NP  diclofenac Sodium (VOLTAREN) 1 % GEL Apply topically in the morning, at noon, and at bedtime.    [provider]  diltiazem (TIAZAC) 120 MG 24 hr capsule Take 1 capsule (120 mg total) by mouth daily. 09/14/21   McGowen, Maryjean Morn, MD  febuxostat (ULORIC) 40 MG tablet Take 40 mg by mouth daily. 01/31/23   [provider]  glucose blood (ONETOUCH ULTRA) test strip CHECK BLOOD SUGAR TWICE  DAILY 11/16/21    McGowen, Maryjean Morn, MD  HYDROcodone-acetaminophen (NORCO/VICODIN) 5-325 MG tablet Take 1-2 tablets by mouth every 6 (six) hours as needed for moderate pain. 1-2 tabs po bid prn pain Patient taking differently: Take 1 tablet by mouth at bedtime. 1-2 tabs po bid prn pain 03/06/22   Zannie Cove, MD  Insulin Lispro Prot & Lispro (HUMALOG 75/25 MIX) (75-25) 100 UNIT/ML Kwikpen Inject 30 Units into the skin 2 (two) times daily. 08/06/22   [provider]  Insulin Pen Needle (B-D ULTRAFINE III SHORT PEN) 31G X 8 MM MISC USE 1 PENNEEDLE TWICE DAILY 07/18/21   McGowen, Maryjean Morn, MD  metolazone (ZAROXOLYN) 5 MG tablet Take on Monday, Wednesday and Friday with Torsemide 06/24/23   Alver Sorrow, NP  Multiple Vitamin (MULTIVITAMIN) tablet Take 1 tablet by mouth daily.    [provider]  potassium chloride SA (KLOR-CON M20) 20 MEQ tablet Take one tablet three times per week. Patient taking differently: Take 20 mEq by mouth every Monday, Wednesday, and Friday. Take one tablet three times per week. 04/16/22   Alver Sorrow, NP  senna-docusate (SENOKOT-S) 8.6-50 MG tablet Take 1 tablet by mouth at bedtime. 03/06/22   Zannie Cove, MD  terazosin (HYTRIN) 10 MG capsule Take 10 mg by mouth at bedtime.    [provider]  testosterone cypionate (DEPOTESTOSTERONE CYPIONATE) 200 MG/ML injection 200 mg once a  week.    [provider]  Torsemide 40 MG TABS Take 40 mg by mouth 2 (two) times daily. 06/24/23   Alver Sorrow, NP  traMADol (ULTRAM) 50 MG tablet Take 50 mg by mouth daily at 6 (six) AM.    [provider]  TRULICITY 1.5 MG/0.5ML SOPN as directed Subcutaneous weekly for 30 days    [provider]  vitamin E 400 UNIT capsule Take 400 Units by mouth every morning. Vitamin E with Selenium    [provider]      Allergies    Other and Hydralazine    Review of Systems   Review of Systems  Constitutional:  Negative for fever.  HENT:   Negative for sore throat.   Respiratory:  Negative for shortness of breath.   Cardiovascular:  Positive for leg swelling. Negative for chest pain.  Gastrointestinal:  Negative for abdominal pain.  Genitourinary:  Negative for dysuria.    Physical Exam Updated Vital Signs BP (!) 109/53   Pulse 73   Temp 98 F (36.7 C)   Resp 14   SpO2 93%  Physical Exam Vitals and nursing note reviewed.  Constitutional:      General: He is not in acute distress.    Appearance: Normal appearance. He is well-developed. He is obese.  HENT:     Head: Normocephalic and atraumatic.  Eyes:     Conjunctiva/sclera: Conjunctivae normal.  Cardiovascular:     Rate and Rhythm: Normal rate and regular rhythm.     Heart sounds: No murmur heard. Pulmonary:     Effort: Pulmonary effort is normal. No respiratory distress.     Breath sounds: Normal breath sounds.  Abdominal:     Palpations: Abdomen is soft.     Tenderness: There is no abdominal tenderness. There is no guarding or rebound.  Musculoskeletal:     Cervical back: Neck supple.     Right lower leg: Edema present.     Left lower leg: Edema present.  Skin:    General: Skin is warm and dry.     Capillary Refill: Capillary refill takes less than 2 seconds.  Neurological:     General: No focal deficit present.     Mental Status: He is alert.     ED Results / Procedures / Treatments   Labs (all labs ordered are listed, but only abnormal results are displayed) Labs Reviewed  BASIC METABOLIC PANEL - Abnormal; Notable for the following components:      Result Value   Sodium 125 (*)    Potassium 2.5 (*)    Chloride 78 (*)    Glucose, Bld 353 (*)    BUN 84 (*)    Creatinine, Ser 3.47 (*)    Calcium 8.0 (*)    GFR, Estimated 18 (*)    Anion gap 16 (*)    All other components within normal limits  CBC WITH DIFFERENTIAL/PLATELET - Abnormal; Notable for the following components:   RBC 6.82 (*)    MCV 75.2 (*)    MCH 24.2 (*)    RDW 19.9 (*)     Monocytes Absolute 1.1 (*)    All other components within normal limits  URINALYSIS, ROUTINE W REFLEX MICROSCOPIC - Abnormal; Notable for the following components:   Color, Urine STRAW (*)    Glucose, UA >=500 (*)    Hgb urine dipstick MODERATE (*)    Leukocytes,Ua LARGE (*)    Bacteria, UA RARE (*)    All  other components within normal limits  BRAIN NATRIURETIC PEPTIDE - Abnormal; Notable for the following components:   B Natriuretic Peptide 108.0 (*)    All other components within normal limits  GLUCOSE, CAPILLARY - Abnormal; Notable for the following components:   Glucose-Capillary 261 (*)    All other components within normal limits  MRSA NEXT GEN BY PCR, NASAL  MAGNESIUM  PHOSPHORUS  HEMOGLOBIN A1C  RENAL FUNCTION PANEL    EKG EKG Interpretation Date/Time:  Thursday June 26 2023 12:32:04 EDT Ventricular Rate:  67 PR Interval:    QRS Duration:  177 QT Interval:  450 QTC Calculation: 476 R Axis:   154  Text Interpretation: Atrial fibrillation Ventricular premature complex RBBB and LPFB increased ectopy comapred with prior 6/23 Confirmed by Meridee Score [phone]) on 06/26/2023 1:17:05 PM  Radiology US Renal  Result Date: 06/26/2023 CLINICAL DATA:  Acute kidney insufficiency. EXAM: RENAL / URINARY TRACT ULTRASOUND COMPLETE COMPARISON:  Renal stone CT 07/13/2022 FINDINGS: Right Kidney: Renal measurements: 11.2 x 6.2 x 6.2 cm = volume: 221.2 mL. Mild global parenchymal atrophy. No collecting system dilatation. Left Kidney: Renal measurements: 12.1 x 6.4 x 5.9 cm = volume: 241.6 mL. Small atrophic kidney without collecting system dilatation or perinephric fluid. There is rounded hypoechoic structure in the midportion of the kidney measuring 18 mm. This could be a cyst but is poorly defined on this examination. Bladder: Bladder is poorly seen. Other: Study limited by overlapping bowel gas and soft tissue. IMPRESSION: Limited study with overlapping bowel gas and soft tissue.  Bilateral renal atrophy No collecting system dilatation. Hypoechoic area along the mid left kidney is of uncertain etiology. Please correlate for any known history or dedicated workup when appropriate such as CT or MRI. Electronically Signed   By: Karen Kays M.D.   On: 06/26/2023 14:59   DG Chest Port 1 View  Result Date: 06/26/2023 CLINICAL DATA:  sob EXAM: PORTABLE CHEST 1 VIEW COMPARISON:  04/23/2022. FINDINGS: Bilateral lung fields are clear. No acute consolidation or major atelectasis. Bilateral costophrenic angles are clear. Stable cardio-mediastinal silhouette. Aortic arch calcifications noted. No acute osseous abnormalities. Probable distal resection of bilateral clavicles. The soft tissues are within normal limits. IMPRESSION: *No acute cardiopulmonary process. Aortic Atherosclerosis (ICD10-I70.0). Electronically Signed   By: Jules Schick M.D.   On: 06/26/2023 13:45    Procedures Procedures    Medications Ordered in ED Medications  potassium chloride 10 mEq in 100 mL IVPB (10 mEq Intravenous New Bag/Given 06/26/23 1716)  0.9 %  sodium chloride infusion ( Intravenous Infusion Verify 06/26/23 1705)  acetaminophen (TYLENOL) tablet 650 mg (has no administration in time range)    Or  acetaminophen (TYLENOL) suppository 650 mg (has no administration in time range)  ondansetron (ZOFRAN) tablet 4 mg (has no administration in time range)    Or  ondansetron (ZOFRAN) injection 4 mg (has no administration in time range)  loratadine (CLARITIN) tablet 10 mg (has no administration in time range)  febuxostat (ULORIC) tablet 40 mg (has no administration in time range)  multivitamin with minerals tablet 1 tablet (has no administration in time range)  insulin glargine-yfgn (SEMGLEE) injection 20 Units (has no administration in time range)  insulin aspart (novoLOG) injection 0-9 Units (5 Units Subcutaneous Given 06/26/23 1642)  insulin aspart (novoLOG) injection 0-5 Units (has no administration in  time range)  terazosin (HYTRIN) capsule 10 mg (has no administration in time range)  senna-docusate (Senokot-S) tablet 1 tablet (has no administration in time range)  butalbital-acetaminophen-caffeine (  FIORICET) 50-325-40 MG per tablet 1-2 tablet (has no administration in time range)  diltiazem (CARDIZEM CD) 24 hr capsule 120 mg (has no administration in time range)  Oral care mouth rinse (has no administration in time range)  potassium chloride SA (KLOR-CON M) CR tablet 40 mEq (40 mEq Oral Given 06/26/23 1351)  0.9 %  sodium chloride infusion (0 mLs Intravenous Stopped 06/26/23 1628)    ED Course/ Medical Decision Making/ A&P Clinical Course as of 06/26/23 1739  Thu Jun 26, 2023  1409 Patient's labs showing an AKI and critically low potassium of 2.5.  Magnesium normal.  I have ordered him some maintenance fluids and some IV and oral potassium.  Discussed with Dr. Gwenlyn Perking [facility] hospitalist will evaluate patient for possible admission.  He asked if I could order the patient a renal ultrasound to make sure there is not an obstructive process. [MB]    Clinical Course User Index [MB] Terrilee Files, MD                                 Medical Decision Making Amount and/or Complexity of Data Reviewed Labs: ordered. Radiology: ordered.  Risk Prescription drug management. Decision regarding hospitalization.   This patient complains of abnormal renal function; this involves an extensive number of treatment Options and is a complaint that carries with it a high risk of complications and morbidity. The differential includes dehydration, overdiuresis, renal failure, metabolic derangement, obstruction  I ordered, reviewed and interpreted labs, which included CBC with normal white count normal hemoglobin, chemistries with critically low sodium and potassium, elevated BUN and creatinine, elevated glucose, urinalysis possible signs of infection I ordered medication IV fluids, oral and IV potassium  and reviewed PMP when indicated. I ordered imaging studies which included chest x-ray, renal ultrasound and I independently    visualized and interpreted imaging which showed no acute infiltrate.  Renal ultrasound pending at time of admission. Previous records obtained and reviewed in epic including recent cardiology notes I consulted [facility] hospitalist Dr. Gwenlyn Perking and discussed lab and imaging findings and discussed disposition.  Cardiac monitoring reviewed, sinus rhythm Social determinants considered, no significant barriers Critical Interventions: None  After the interventions stated above, I reevaluated the patient and found patient to be well-appearing and stable hemodynamically Admission and further testing considered, he would benefit from IV and oral repletion of potassium and IV hydration.  Patient in agreement with plan for admission.         Final Clinical Impression(s) / ED Diagnoses Final diagnoses:  AKI (acute kidney injury) (HCC)  Hyponatremia  Hypokalemia    Rx / DC Orders ED Discharge Orders     None         Terrilee Files, MD 06/26/23 1742

## 2023-06-26 NOTE — Telephone Encounter (Signed)
Left message to call back  

## 2023-06-26 NOTE — Assessment & Plan Note (Signed)
-  Body mass index is 43.1 kg/m. -Low-calorie diet, portion control and increase physical activity discussed with patient.

## 2023-06-26 NOTE — Assessment & Plan Note (Addendum)
-  Rate controlled -Continue the use of Cardizem. -Continue holding Eliquis in the setting of worsening renal failure -Heparin for DVT prophylaxis will be provided -Continue telemetry monitoring. -Follow electrolytes and try to maintain potassium above 4 and magnesium above 2. -Patient denies palpitation.

## 2023-06-26 NOTE — Assessment & Plan Note (Addendum)
-  Appears stable and compensated currently -Continue holding diuretics in the setting of acute renal failure. -Heart healthy/low-sodium diet recommended -Follow daily weights and strict intake and output. -Continue patient follow-up with cardiology service.

## 2023-06-26 NOTE — Assessment & Plan Note (Addendum)
-  In the setting of diuretic usage after receiving adjustment to treat exacerbation of his underlying CHF. -Patient reports no shortness of breath on examination is a slightly on the dry side. -Will continue judicious fluid resuscitation -Continue avoiding the use of nephrotoxic agents, contrast and hypotension -Continue to follow daily weights/strict I's and O's, check renal ultrasound, urinalysis and follow renal function trend. -Based on patient results will discuss with nephrology service.

## 2023-06-27 ENCOUNTER — Encounter (HOSPITAL_BASED_OUTPATIENT_CLINIC_OR_DEPARTMENT_OTHER): Payer: Self-pay | Admitting: Family

## 2023-06-27 DIAGNOSIS — E871 Hypo-osmolality and hyponatremia: Secondary | ICD-10-CM | POA: Diagnosis present

## 2023-06-27 DIAGNOSIS — L89312 Pressure ulcer of right buttock, stage 2: Secondary | ICD-10-CM | POA: Diagnosis present

## 2023-06-27 DIAGNOSIS — E861 Hypovolemia: Secondary | ICD-10-CM | POA: Diagnosis present

## 2023-06-27 DIAGNOSIS — E1165 Type 2 diabetes mellitus with hyperglycemia: Secondary | ICD-10-CM | POA: Diagnosis present

## 2023-06-27 DIAGNOSIS — I4819 Other persistent atrial fibrillation: Secondary | ICD-10-CM | POA: Diagnosis present

## 2023-06-27 DIAGNOSIS — Z794 Long term (current) use of insulin: Secondary | ICD-10-CM | POA: Diagnosis not present

## 2023-06-27 DIAGNOSIS — Z79899 Other long term (current) drug therapy: Secondary | ICD-10-CM | POA: Diagnosis not present

## 2023-06-27 DIAGNOSIS — Z6841 Body Mass Index (BMI) 40.0 and over, adult: Secondary | ICD-10-CM | POA: Diagnosis not present

## 2023-06-27 DIAGNOSIS — N1832 Chronic kidney disease, stage 3b: Secondary | ICD-10-CM | POA: Diagnosis present

## 2023-06-27 DIAGNOSIS — L89322 Pressure ulcer of left buttock, stage 2: Secondary | ICD-10-CM | POA: Diagnosis present

## 2023-06-27 DIAGNOSIS — Z7985 Long-term (current) use of injectable non-insulin antidiabetic drugs: Secondary | ICD-10-CM | POA: Diagnosis not present

## 2023-06-27 DIAGNOSIS — Z7901 Long term (current) use of anticoagulants: Secondary | ICD-10-CM | POA: Diagnosis not present

## 2023-06-27 DIAGNOSIS — I130 Hypertensive heart and chronic kidney disease with heart failure and stage 1 through stage 4 chronic kidney disease, or unspecified chronic kidney disease: Secondary | ICD-10-CM | POA: Diagnosis present

## 2023-06-27 DIAGNOSIS — Z833 Family history of diabetes mellitus: Secondary | ICD-10-CM | POA: Diagnosis not present

## 2023-06-27 DIAGNOSIS — N179 Acute kidney failure, unspecified: Secondary | ICD-10-CM | POA: Diagnosis present

## 2023-06-27 DIAGNOSIS — I5032 Chronic diastolic (congestive) heart failure: Secondary | ICD-10-CM | POA: Diagnosis present

## 2023-06-27 DIAGNOSIS — Z8614 Personal history of Methicillin resistant Staphylococcus aureus infection: Secondary | ICD-10-CM | POA: Diagnosis not present

## 2023-06-27 DIAGNOSIS — E1122 Type 2 diabetes mellitus with diabetic chronic kidney disease: Secondary | ICD-10-CM | POA: Diagnosis present

## 2023-06-27 DIAGNOSIS — Z888 Allergy status to other drugs, medicaments and biological substances status: Secondary | ICD-10-CM | POA: Diagnosis not present

## 2023-06-27 DIAGNOSIS — E876 Hypokalemia: Secondary | ICD-10-CM | POA: Diagnosis present

## 2023-06-27 DIAGNOSIS — G2581 Restless legs syndrome: Secondary | ICD-10-CM | POA: Diagnosis present

## 2023-06-27 DIAGNOSIS — G4733 Obstructive sleep apnea (adult) (pediatric): Secondary | ICD-10-CM | POA: Diagnosis present

## 2023-06-27 DIAGNOSIS — Z66 Do not resuscitate: Secondary | ICD-10-CM | POA: Diagnosis present

## 2023-06-27 LAB — RENAL FUNCTION PANEL
Albumin: 2.9 g/dL — ABNORMAL LOW (ref 3.5–5.0)
Anion gap: 14 (ref 5–15)
BUN: 81 mg/dL — ABNORMAL HIGH (ref 8–23)
CO2: 31 mmol/L (ref 22–32)
Calcium: 8.1 mg/dL — ABNORMAL LOW (ref 8.9–10.3)
Chloride: 84 mmol/L — ABNORMAL LOW (ref 98–111)
Creatinine, Ser: 3.18 mg/dL — ABNORMAL HIGH (ref 0.61–1.24)
GFR, Estimated: 20 mL/min — ABNORMAL LOW (ref >60)
Glucose, Bld: 226 mg/dL — ABNORMAL HIGH (ref 70–99)
Phosphorus: 3.9 mg/dL (ref 2.5–4.6)
Potassium: 2.8 mmol/L — ABNORMAL LOW (ref 3.5–5.1)
Sodium: 129 mmol/L — ABNORMAL LOW (ref 135–145)

## 2023-06-27 LAB — GLUCOSE, CAPILLARY
Glucose-Capillary: 238 mg/dL — ABNORMAL HIGH (ref 70–99)
Glucose-Capillary: 293 mg/dL — ABNORMAL HIGH (ref 70–99)
Glucose-Capillary: 276 mg/dL — ABNORMAL HIGH (ref 70–99)
Glucose-Capillary: 263 mg/dL — ABNORMAL HIGH (ref 70–99)

## 2023-06-27 MED ORDER — TRULICITY 3 MG/0.5ML ~~LOC~~ SOAJ: 3.0000 mg | SUBCUTANEOUS | 3 refills | Status: DC

## 2023-06-27 MED ORDER — OXYCODONE HCL 5 MG PO TABS
5.0000 mg | ORAL_TABLET | Freq: Once | ORAL | Status: AC | PRN
  Administered 2023-06-27: 5 mg via ORAL
  Filled 2023-06-27: qty 1

## 2023-06-27 MED ORDER — POTASSIUM CHLORIDE CRYS ER 20 MEQ PO TBCR
40.0000 meq | EXTENDED_RELEASE_TABLET | Freq: Once | ORAL | Status: AC
  Administered 2023-06-27: 40 meq via ORAL
  Filled 2023-06-27: qty 2

## 2023-06-27 MED ORDER — POTASSIUM CHLORIDE CRYS ER 20 MEQ PO TBCR
20.0000 meq | EXTENDED_RELEASE_TABLET | Freq: Once | ORAL | Status: AC
  Administered 2023-06-27: 20 meq via ORAL
  Filled 2023-06-27: qty 1

## 2023-06-27 MED ORDER — POLYETHYLENE GLYCOL 3350 17 G PO PACK
17.0000 g | PACK | Freq: Every day | ORAL | Status: DC
  Administered 2023-06-27 – 2023-06-30 (×3): 17 g via ORAL
  Filled 2023-06-27 (×3): qty 1

## 2023-06-27 MED ORDER — SODIUM CHLORIDE 0.9 % IV SOLN: INTRAVENOUS | Status: AC

## 2023-06-27 NOTE — TOC Initial Note (Signed)
Transition of Care [facility]) - Initial/Assessment Note    Patient Details  Name: [name] MRN: [id number] Date of Birth: 03/03/1951  [facility] CM/SW Contact:    Annice Needy, LCSW Phone Number: 06/27/2023, 5:57 PM  Clinical Narrative:                 Patient is LTC at [facility]. He uses a wc at baseline. Staff assists him with bathing. He plans on returning to the facility at d/c.   Expected Discharge Plan: Skilled Nursing Facility Barriers to Discharge: Continued Medical Work up   Patient Goals and CMS Choice Patient states their goals for this hospitalization and ongoing recovery are:: return to the facility          Expected Discharge Plan and Services       Living arrangements for the past 2 months: Skilled Nursing Facility                                      Prior Living Arrangements/Services Living arrangements for the past 2 months: Skilled Nursing Facility Lives with:: Facility Resident                   Activities of Daily Living Home Assistive Devices/Equipment: None ADL Screening (condition at time of admission) Patient's cognitive ability adequate to safely complete daily activities?: Yes Is the patient deaf or have difficulty hearing?: No Does the patient have difficulty seeing, even when wearing glasses/contacts?: No Does the patient have difficulty concentrating, remembering, or making decisions?: No Patient able to express need for assistance with ADLs?: Yes Does the patient have difficulty dressing or bathing?: Yes Independently performs ADLs?: No Communication: Independent Dressing (OT): Needs assistance Is this a change from baseline?: Pre-admission baseline Grooming: Independent Feeding: Independent Bathing: Needs assistance Is this a change from baseline?: Pre-admission baseline Toileting: Needs assistance Is this a change from baseline?: Pre-admission baseline In/Out Bed:  Dependent Is this a change from baseline?: Pre-admission baseline Walks in Home: Dependent (pt states he does not walk only sits on side of bed at current facility) Is this a change from baseline?: Pre-admission baseline Does the patient have difficulty walking or climbing stairs?: Yes Weakness of Legs: Both Weakness of Arms/Hands: None  Permission Sought/Granted                  Emotional Assessment       Orientation: : Oriented to Self, Oriented to Place, Oriented to  Time, Oriented to Situation Alcohol / Substance Use: Not Applicable Psych Involvement: No (comment)  Admission diagnosis:  Acute renal failure superimposed on stage 3b chronic kidney disease, unspecified acute renal failure type (HCC) [N17.9, N18.32] Patient Active Problem List   Diagnosis Date Noted   Acute renal failure superimposed on stage 3b chronic kidney disease, unspecified acute renal failure type (HCC) 06/26/2023   Hypokalemia 06/26/2023   Primary osteoarthritis of right knee 06/04/2022   Primary osteoarthritis of left knee 06/04/2022   Body mass index 45.0-49.9, adult (HCC) 06/04/2022   AKI (acute kidney injury) (HCC) 04/25/2022   Acute renal failure superimposed on stage 3b chronic kidney disease (HCC) 04/23/2022   Hyponatremia 04/23/2022   Pressure injury of skin 03/07/2022   Acute kidney injury superimposed on chronic kidney disease (HCC)    Obesity, Class III, BMI 40-49.9 (morbid obesity) (HCC)    Acute on chronic diastolic CHF (congestive  heart failure) (HCC) 02/13/2022   Uncontrolled type 2 diabetes mellitus with hyperglycemia (HCC) 03/04/2018   Chronic diastolic CHF (congestive heart failure) (HCC) 12/24/2016   Lymphedema 02/22/2016   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   Chronic headaches 05/10/2015   Equivocal stress test 10/24/2014   Persistent atrial fibrillation (HCC) 08/15/2014   Healthcare maintenance 08/15/2014   Type 2 diabetes mellitus with diabetic  neuropathy (HCC) 08/15/2014   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 [place], [place] - [address] [place] [address] Phone: [phone] Fax: [phone]     Social Determinants of Health (SDOH) Social History: SDOH Screenings   Food Insecurity: No Food Insecurity (06/26/2023)  Housing: Low Risk  (06/26/2023)  Transportation Needs: No Transportation Needs (06/26/2023)  Utilities: Not At Risk (06/26/2023)  Alcohol Screen: Low Risk  (11/21/2021)  Depression (PHQ2-9): Low Risk  (10/10/2022)  Financial Resource Strain: Low Risk  (10/06/2022)  Physical Activity: Unknown (10/06/2022)  Recent Concern: Physical Activity - Inactive (10/06/2022)  Social Connections: Moderately Isolated (10/06/2022)  Stress: No Stress Concern Present (10/06/2022)  Tobacco Use: Low Risk  (06/26/2023)   SDOH Interventions:     Readmission Risk Interventions    04/26/2022    1:39 PM 02/15/2022    2:32 PM  Readmission Risk Prevention Plan  Transportation Screening Complete Complete  PCP or Specialist Appt within 5-7 Days Complete Complete  Home Care Screening Complete   Medication Review (RN CM) Complete

## 2023-06-27 NOTE — Progress Notes (Signed)
Progress Note   Patient: [name] XBJ:[id number] DOB: 1951-04-01 DOA: 06/26/2023     0 DOS: the patient was seen and examined on 06/27/2023   Brief hospital admission narrative: [name] is a 72 y.o. 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  Patient reports having blood work in anticipation to visit his nephrology when he was instructed to come to the emergency department for further evaluation and management.   Patient is currently denying chest pain, shortness of breath, nausea, vomiting, abdominal pain, dysuria, hematuria, melena, hematochezia, focal weaknesses, sick contacts or any other complaints.   Patient reports approximately 1 week prior to admission he was experiencing increased short winded sensation and weight gain and his diuretic regimen was adjusted.  Since then symptoms has resolved.   Abnormal blood work demonstrated significant elevation in his BUN and acute on chronic renal failure with hyponatremia.  TRH has been consulted to place patient in the hospital for further evaluation and management.  Assessment and Plan: * Acute renal failure superimposed on stage 3b chronic kidney disease, unspecified acute renal failure type (HCC) -In the setting of diuretic usage after receiving adjustment to treat exacerbation of his underlying CHF. -Patient reports no shortness of breath on examination is a slightly on the dry side. -Will continue judicious fluid resuscitation -Continue avoiding the use of nephrotoxic agents, contrast and hypotension -Continue to follow daily weights/strict I's and O's, check renal ultrasound, urinalysis and follow renal function trend. -Based on patient results will discuss with nephrology  service.  Persistent atrial fibrillation (HCC) -Rate controlled -Continue the use of Cardizem. -Continue holding Eliquis in the setting of worsening renal failure -Heparin for DVT prophylaxis will be provided -Continue telemetry monitoring. -Follow electrolytes and try to maintain potassium above 4 and magnesium above 2. -Patient denies palpitation.  Chronic diastolic CHF (congestive heart failure) (HCC) -Appears stable and compensated currently -Continue holding diuretics in the setting of acute renal failure. -Heart healthy/low-sodium diet recommended -Follow daily weights and strict intake and output. -Continue patient follow-up with cardiology service.  Hypertension, essential -Continue treatment with Cardizem and hytrin -Continue to follow vital signs and avoid hypotension -Heart healthy diet discussed with patient. -Vital signs appears to be stable.  Uncontrolled type 2 diabetes mellitus with hyperglycemia, with long-term current use of insulin (HCC) -A1c 8.4 -Will continue holding [place] and Trulicity in the setting of worsening renal failure -Continue sliding scale insulin and Semglee -Follow CBG fluctuation and adjust management as required.  Hypokalemia -Continue to replete electrolytes and follow trend -Continue telemetry monitoring -Maintain adequate hydration.  Chronic headaches -Continue as needed Fioricet. -No headaches currently.  Morbid obesity due to excess calories (HCC) -Body mass index is 43.1 kg/m. -Low-calorie diet, portion control and increase physical activity discussed with patient.   Subjective:  Afebrile, no chest pain, no nausea, no vomiting, no shortness of breath.  Patient reports no overnight events.  In no acute distress.  Physical Exam: Vitals:   06/26/23 2111 06/27/23 0500 06/27/23 0541 06/27/23 1413  BP: 104/64  (!) 132/54 114/73  Pulse: 77  76 77  Resp:   20 19  Temp: 97.9 F (36.6 C)  98.5 F (36.9 C) 98 F (36.7 C)   TempSrc: Oral     SpO2: 94%  98% 95%  Weight:  135.4 kg  Height:       General exam: Alert, awake, oriented x 3; obese, in no acute distress.  Denies chest pain or shortness of breath. Respiratory system: Clear to auscultation. Respiratory effort normal.  Good saturation on room air. Cardiovascular system:RRR. No rubs or gallops; unable to assess JVD with body habitus. Gastrointestinal system: Abdomen is obese, nondistended, soft and nontender. No organomegaly or masses felt. Normal bowel sounds heard. Central nervous system: Alert and oriented. No focal neurological deficits. Extremities: No cyanosis or clubbing; lower extremity Unna boots dressing in place (exchanged every 3 days).  No drainage. Skin: No petechiae.  Bilateral buttocks pressure injury present at time of admission without signs of superimposed infection. Psychiatry: Judgement and insight appear normal. Mood & affect appropriate.   Data Reviewed: Renal function panel: Sodium 129, potassium 2.8, chloride 84, bicarb 31, glucose 226, BUN 81, creatinine 3.18 and GFR 20.  Family Communication: No family at bedside.  Disposition: Status is: Inpatient Remains inpatient appropriate because: Continue minimizing nephrotoxic agent; continue fluid resuscitation and follow renal function trend.  Creatinine still not back to baseline.   Planned Discharge Destination: Skilled nursing facility  Time spent: 35 minutes  Author: Vassie Loll, MD 06/27/2023 4:41 PM  For on call review [email or web address].

## 2023-06-27 NOTE — [place] FL2 (Signed)
[place] MEDICAID FL2 LEVEL OF CARE FORM     IDENTIFICATION  Patient Name: [name] Birthdate: 11/04/1951 Sex: male Admission Date (Current Location): 06/26/2023  [place] and [place] Number:  Aaron Edelman [id number] Q Facility and Address:  [name] : [facility],  [address], [place] [address]      Provider Number: [id number]  Attending Physician Name and Address:  Vassie Loll, MD  Relative Name and Phone Number:  Balraj, Ifft (Brother)  [phone]    Current Level of Care: Hospital Recommended Level of Care: Skilled Nursing Facility Prior Approval Number:    Date Approved/Denied:   PASRR Number:    Discharge Plan: SNF    Current Diagnoses: Patient Active Problem List   Diagnosis Date Noted   Acute renal failure superimposed on stage 3b chronic kidney disease, unspecified acute renal failure type (HCC) 06/26/2023   Hypokalemia 06/26/2023   Primary osteoarthritis of right knee 06/04/2022   Primary osteoarthritis of left knee 06/04/2022   Body mass index 45.0-49.9, adult (HCC) 06/04/2022   AKI (acute kidney injury) (HCC) 04/25/2022   Acute renal failure superimposed on stage 3b chronic kidney disease (HCC) 04/23/2022   Hyponatremia 04/23/2022   Pressure injury of skin 03/07/2022   Acute kidney injury superimposed on chronic kidney disease (HCC)    Obesity, Class III, BMI 40-49.9 (morbid obesity) (HCC)    Acute on chronic diastolic CHF (congestive heart failure) (HCC) 02/13/2022   Uncontrolled type 2 diabetes mellitus with hyperglycemia (HCC) 03/04/2018   Chronic diastolic CHF (congestive heart failure) (HCC) 12/24/2016   Lymphedema 02/22/2016   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   Chronic headaches 05/10/2015   Equivocal stress test 10/24/2014   Persistent atrial fibrillation (HCC) 08/15/2014   Healthcare maintenance 08/15/2014   Type 2 diabetes mellitus with diabetic neuropathy (HCC) 08/15/2014   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    Recurrent cellulitis of lower extremity 08/02/2013   Lymphedema of both lower extremities 11/04/2008   Morbid obesity due to excess calories (HCC) 12/14/2007   Venous (peripheral) insufficiency 07/22/2007   Uncontrolled type 2 diabetes mellitus with hyperglycemia, with long-term current use of insulin (HCC) 08/11/2006   Hypertension, essential 08/11/2006   ALLERGIC RHINITIS 08/11/2006   ASTHMA 08/11/2006   Asthma 08/11/2006    Orientation RESPIRATION BLADDER Height & Weight     Self, Time, Situation, Place  Normal External catheter Weight: 298 lb 8.1 oz (135.4 kg) Height:  5\' 10"  (177.8 cm)  BEHAVIORAL SYMPTOMS/MOOD NEUROLOGICAL BOWEL NUTRITION STATUS      Continent Diet (heart healthy)  AMBULATORY STATUS COMMUNICATION OF NEEDS Skin   Extensive Assist (wheelchair) Verbally PU Stage and Appropriate Care (irritant dermatitis groin bilateral; skin tear abodmen right distal, anterior, lateral; skin tear to right lower fold; stage 2 buttocks right)                       Personal Care Assistance Level of Assistance  Bathing, Feeding, Dressing Bathing Assistance: Maximum assistance Feeding assistance: Limited assistance Dressing Assistance: Maximum assistance     Functional Limitations Info  Sight, Hearing, Speech Sight Info: Adequate Hearing Info: Adequate Speech Info: Adequate    SPECIAL CARE FACTORS FREQUENCY                       Contractures Contractures Info: Not present    Additional Factors Info  Code Status, Allergies Code Status Info: DNR Allergies Info: hydralazine, other           Current Medications (06/27/2023):  This is the current hospital active medication list Current Facility-Administered Medications  Medication Dose Route Frequency Provider Last Rate Last Admin   0.9 %  sodium chloride infusion   Intravenous Continuous Vassie Loll, MD 75 mL/hr at 06/27/23 1107 New Bag at 06/27/23 1107   acetaminophen (TYLENOL) tablet 650 mg  650 mg  Oral Q6H PRN Vassie Loll, MD       Or   acetaminophen (TYLENOL) suppository 650 mg  650 mg Rectal Q6H PRN Vassie Loll, MD       butalbital-acetaminophen-caffeine (FIORICET) [phone] MG per tablet 1-2 tablet  1-2 tablet Oral Q6H PRN Vassie Loll, MD   2 tablet at 06/26/23 2200   diltiazem (CARDIZEM CD) 24 hr capsule 120 mg  120 mg Oral Daily Vassie Loll, MD   120 mg at 06/27/23 0911   febuxostat (ULORIC) tablet 40 mg  40 mg Oral Daily Vassie Loll, MD   40 mg at 06/27/23 0911   insulin aspart (novoLOG) injection 0-5 Units  0-5 Units Subcutaneous QHS Vassie Loll, MD   3 Units at 06/26/23 2154   insulin aspart (novoLOG) injection 0-9 Units  0-9 Units Subcutaneous TID [facility] Vassie Loll, MD   5 Units at 06/27/23 1649   insulin glargine-yfgn (SEMGLEE) injection 20 Units  20 Units Subcutaneous QHS Vassie Loll, MD   20 Units at 06/26/23 2154   loratadine (CLARITIN) tablet 10 mg  10 mg Oral Daily Vassie Loll, MD   10 mg at 06/26/23 2156   multivitamin with minerals tablet 1 tablet  1 tablet Oral Daily Vassie Loll, MD   1 tablet at 06/27/23 0911   ondansetron (ZOFRAN) tablet 4 mg  4 mg Oral Q6H PRN Vassie Loll, MD       Or   ondansetron [facility]) injection 4 mg  4 mg Intravenous Q6H PRN Vassie Loll, MD       Oral care mouth rinse  15 mL Mouth Rinse PRN Vassie Loll, MD       senna-docusate (Senokot-S) tablet 1 tablet  1 tablet Oral QHS Vassie Loll, MD   1 tablet at 06/26/23 2156   terazosin (HYTRIN) capsule 10 mg  10 mg Oral QHS Vassie Loll, MD   10 mg at 06/26/23 2155     Discharge Medications: Please see discharge summary for a list of discharge medications.  Relevant Imaging Results:  Relevant Lab Results:   Additional Information    Harshini Trent, Juleen China, LCSW

## 2023-06-27 NOTE — Telephone Encounter (Signed)
Returned call to facility, verbal orders given, rx updated and printed, will have provider sign and fax over to [facility] at [phone]

## 2023-06-27 NOTE — Plan of Care (Signed)

## 2023-06-27 NOTE — Inpatient Diabetes Management (Signed)
Inpatient Diabetes Program Recommendations  AACE/ADA: New Consensus Statement on Inpatient Glycemic Control (2015)  Target Ranges:  Prepandial:   less than 140 mg/dL      Peak postprandial:   less than 180 mg/dL (1-2 hours)      Critically ill patients:  140 - 180 mg/dL    Latest Reference Range & Units 06/26/23 16:07  Hemoglobin A1C 4.8 - 5.6 % 8.4 (H)    Latest Reference Range & Units 06/26/23 16:38 06/26/23 21:13 06/27/23 08:11 06/27/23 11:27  Glucose-Capillary 70 - 99 mg/dL 161 (H)  5 units Novolog 272 (H)  3 units Novolog  20 units Semglee 238 (H)  3 units Novolog  293 (H)  5 units Novolog   (H): Data is abnormally high   Admit with: Acute renal failure superimposed on stage 3b chronic kidney disease, unspecified acute renal failure type   History: DM2, CKD, CHF  Home DM Meds: Humalog 75/25 Insulin 38 units BID      Trulicity 1.5 mg Qweek       Farxiga 10 mg QAM   Current Orders: Semglee 20 units at bedtime      Novolog Sensitive Correction Scale/ SSI (0-9 units) TID AC + HS    MD- Please consider:  1. Increase Semglee to 25 units at bedtime  2. Start Novolog Meal Coverage: Novolog 4 units TID with meals HOLD if pt NPO HOLD if pt eats <50% meals     --Will follow patient during hospitalization--  Ambrose Finland RN, MSN, CDCES Diabetes Coordinator Inpatient Glycemic Control Team Team Pager: [phone] (8a-5p)

## 2023-06-28 DIAGNOSIS — N179 Acute kidney failure, unspecified: Secondary | ICD-10-CM | POA: Diagnosis not present

## 2023-06-28 DIAGNOSIS — N1832 Chronic kidney disease, stage 3b: Secondary | ICD-10-CM | POA: Diagnosis not present

## 2023-06-28 LAB — GLUCOSE, CAPILLARY
Glucose-Capillary: 213 mg/dL — ABNORMAL HIGH (ref 70–99)
Glucose-Capillary: 214 mg/dL — ABNORMAL HIGH (ref 70–99)
Glucose-Capillary: 221 mg/dL — ABNORMAL HIGH (ref 70–99)
Glucose-Capillary: 242 mg/dL — ABNORMAL HIGH (ref 70–99)

## 2023-06-28 LAB — BASIC METABOLIC PANEL
Anion gap: 12 (ref 5–15)
BUN: 64 mg/dL — ABNORMAL HIGH (ref 8–23)
CO2: 30 mmol/L (ref 22–32)
Calcium: 8 mg/dL — ABNORMAL LOW (ref 8.9–10.3)
Chloride: 89 mmol/L — ABNORMAL LOW (ref 98–111)
Creatinine, Ser: 2.44 mg/dL — ABNORMAL HIGH (ref 0.61–1.24)
GFR, Estimated: 27 mL/min — ABNORMAL LOW (ref >60)
Glucose, Bld: 248 mg/dL — ABNORMAL HIGH (ref 70–99)
Potassium: 3 mmol/L — ABNORMAL LOW (ref 3.5–5.1)
Sodium: 131 mmol/L — ABNORMAL LOW (ref 135–145)

## 2023-06-28 MED ORDER — ZINC OXIDE 40 % EX OINT
TOPICAL_OINTMENT | CUTANEOUS | Status: DC | PRN
  Filled 2023-06-28: qty 56.7

## 2023-06-28 MED ORDER — SODIUM CHLORIDE 0.9 % IV SOLN: INTRAVENOUS | Status: DC

## 2023-06-28 MED ORDER — DOCUSATE SODIUM 100 MG PO CAPS
100.0000 mg | ORAL_CAPSULE | Freq: Two times a day (BID) | ORAL | Status: DC
  Administered 2023-06-29 – 2023-06-30 (×2): 100 mg via ORAL
  Filled 2023-06-28 (×4): qty 1

## 2023-06-28 MED ORDER — POTASSIUM CHLORIDE CRYS ER 20 MEQ PO TBCR
40.0000 meq | EXTENDED_RELEASE_TABLET | Freq: Once | ORAL | Status: AC
  Administered 2023-06-28: 40 meq via ORAL
  Filled 2023-06-28: qty 2

## 2023-06-28 MED ORDER — INSULIN GLARGINE-YFGN 100 UNIT/ML ~~LOC~~ SOLN
23.0000 [IU] | Freq: Every day | SUBCUTANEOUS | Status: DC
  Administered 2023-06-28 – 2023-06-29 (×2): 23 [IU] via SUBCUTANEOUS
  Filled 2023-06-28 (×3): qty 0.23

## 2023-06-28 MED ORDER — TRAMADOL HCL 50 MG PO TABS
50.0000 mg | ORAL_TABLET | Freq: Three times a day (TID) | ORAL | Status: DC
  Administered 2023-06-28 – 2023-06-30 (×7): 50 mg via ORAL
  Filled 2023-06-28 (×7): qty 1

## 2023-06-28 MED ORDER — APIXABAN 5 MG PO TABS
5.0000 mg | ORAL_TABLET | Freq: Two times a day (BID) | ORAL | Status: DC
  Administered 2023-06-28 – 2023-06-30 (×4): 5 mg via ORAL
  Filled 2023-06-28 (×4): qty 1

## 2023-06-28 NOTE — Progress Notes (Signed)
ANTICOAGULATION CONSULT NOTE - Initial Consult  Pharmacy Consult for apixaban Indication: atrial fibrillation  Allergies  Allergen Reactions   Other Other (See Comments)    Sneezing, coughing   Hydralazine Other (See Comments)    Drowsiness/sedation/mental fog    Patient Measurements: Height: 5\' 10"  (177.8 cm) Weight: (!) 138.6 kg (305 lb 8.9 oz) IBW/kg (Calculated) : 73  Vital Signs: Temp: 98.3 F (36.8 C) (08/24 1359) BP: 119/66 (08/24 1359) Pulse Rate: 78 (08/24 1359)  Labs: Recent Labs    06/26/23 1251 06/27/23 0446 06/28/23 0549  HGB 16.5  --   --   HCT 51.3  --   --   PLT 259  --   --   CREATININE 3.47* 3.18* 2.44*    Estimated Creatinine Clearance: 38.4 mL/min (A) (by C-G formula based on SCr of 2.44 mg/dL (H)).   Medical History: Past Medical History:  Diagnosis Date   ALLERGIC RHINITIS 08/11/2006   ASTHMA 08/11/2006   Bradycardia    Beta blocker d/c'd 2022   Chronic atrial fibrillation (HCC) 08/2014   Chronic combined systolic and diastolic CHF (congestive heart failure) [facility])    Cardiology f/u 12/2017: pt volume overloaded (R heart dysf suspected), BNP very high, lasix increased.  Repeat echo 12/2017: normal LV EF, mild DD, +RV syst dysfxn, mod pulm HTN, biatrial enlgmt.   Chronic constipation    Chronic renal insufficiency, stage 3 (moderate) (HCC)    Colon cancer screening 02/2021   02/2021 Cologuard POS->GI ref   DIABETES MELLITUS, TYPE II 08/11/2006   HYPERTENSION 08/11/2006   IDA (iron deficiency anemia)    04/2022.  Iron started.  Needs hemoccults   Impaired mobility and endurance    MRSA infection 06/2018   LL venous stasis ulcer infected   Normocytic anemia 2016-2019   03/2018 B12 normal, iron ok (ferritin borderline low).   OBESITY, MORBID 12/14/2007   saxenda started 05/2020 by [facility] wt mgmt center   OSA (obstructive sleep apnea)    to get sleep study with [facility]) as of 12/03/2018 consult.   Recurrent cellulitis of  lower leg 2017-18   06/2017 Clindamycin suppression (hx of MRSA) caused diarrhea.  [id number] ID started him on amoxil prophylaxis---ineffective.  End 2018/Jan 2019 penicillin G injections prophyl helpful but pt declined to continue this as of 01/2018 ID f/u.  ID talked him into resuming monthly penicillin G as of 02/2018 f/u.     Restless leg syndrome    Rx'd clonazepam 09/2017 and pt refused to take it after reading the medication's potential side effects.   Venous stasis ulcers of both lower extremities (HCC)    Severe lymphedema.  wound clinic care ongoing as of 01/2018    Medications:  Facility-Administered Medications Prior to Admission  Medication Dose Route Frequency Provider Last Rate Last Admin   bupivacaine (MARCAINE) 0.5 % (with pres) injection 3 mL  3 mL Other Once Tyrell Antonio, MD       Medications Prior to Admission  Medication Sig Dispense Refill Last Dose   apixaban (ELIQUIS) 5 MG TABS tablet Take 5 mg by mouth 2 (two) times daily.   06/26/2023 at 0800   b complex vitamins capsule Take 1 capsule by mouth every morning.   06/26/2023   cetirizine (ZYRTEC) 10 MG tablet Take 10 mg by mouth at bedtime.   06/25/2023   dapagliflozin propanediol (FARXIGA) 10 MG TABS tablet Take 1 tablet (10 mg total) by mouth daily before breakfast. 30 tablet 5 06/26/2023   diltiazem (  TIAZAC) 120 MG 24 hr capsule Take 1 capsule (120 mg total) by mouth daily. 90 capsule 3 06/26/2023   docusate sodium (COLACE) 100 MG capsule Take 100 mg by mouth 2 (two) times daily.   06/26/2023   febuxostat (ULORIC) 40 MG tablet Take 40 mg by mouth daily.   06/26/2023   ferrous sulfate 325 (65 FE) MG tablet Take 325 mg by mouth daily with breakfast.   06/26/2023   HYDROcodone-acetaminophen (NORCO/VICODIN) 5-325 MG tablet Take 1-2 tablets by mouth every 6 (six) hours as needed for moderate pain. 1-2 tabs po bid prn pain (Patient taking differently: Take 1 tablet by mouth at bedtime. 1-2 tabs po bid prn pain) 10 tablet 0 06/25/2023    Insulin Lispro Prot & Lispro (HUMALOG 75/25 MIX) (75-25) 100 UNIT/ML Kwikpen Inject 38 Units into the skin 2 (two) times daily.   06/26/2023   lidocaine (LIDODERM) 5 % Place 1 patch onto the skin daily. Remove & Discard patch within 12 hours or as directed by MD, use as needed for pain relief.   unknown   metolazone (ZAROXOLYN) 5 MG tablet Take on Monday, Wednesday and Friday with Torsemide   06/25/2023   miconazole (MICOTIN) 2 % powder Apply 1 Application topically in the morning and at bedtime. To abdominal fold.   06/26/2023   potassium chloride SA (KLOR-CON M20) 20 MEQ tablet Take one tablet three times per week. (Patient taking differently: Take 20 mEq by mouth every Monday, Wednesday, and Friday. Take one tablet three times per week.) 45 tablet 2 06/25/2023   terazosin (HYTRIN) 10 MG capsule Take 10 mg by mouth at bedtime.   06/25/2023   testosterone cypionate (DEPOTESTOSTERONE CYPIONATE) 200 MG/ML injection Inject 0.5 mLs into the muscle once a week. On Friday.   06/20/2023   Torsemide 40 MG TABS Take 40 mg by mouth 2 (two) times daily.   06/26/2023   traMADol (ULTRAM) 50 MG tablet Take 50 mg by mouth in the morning, at noon, and at bedtime.   06/26/2023   glucose blood (ONETOUCH ULTRA) test strip CHECK BLOOD SUGAR TWICE  DAILY 200 strip 0    Insulin Pen Needle (B-D ULTRAFINE III SHORT PEN) 31G X 8 MM MISC USE 1 PENNEEDLE TWICE DAILY 200 each 1     Assessment: 72 y.o. presented with AKI. Pt on Eliquis pta for afib - last dose 8/22. Held on admission with AKI. SCr improved to 2.44 and pharmacy consulted to restart Eliquis. CBC ok on admission.  Goal of Therapy:  Prevention of CVA Monitor platelets by anticoagulation protocol: Yes   Plan:  Eliquis 5mg  po BID starting this evening. Will continue to f/u renal function and CBC  Christoper Fabian, PharmD, BCPS Please see amion for complete clinical pharmacist phone list 06/28/2023,6:15 PM

## 2023-06-28 NOTE — Progress Notes (Signed)
PROGRESS NOTE   [name]  HYQ:[id number]    DOB: 01/14/1951    DOA: 06/26/2023  PCP: Charlynne Pander, MD   I have briefly reviewed patients previous medical records in [facility].  Chief Complaint  Patient presents with   abnormal labs    Brief Narrative:   72 y.o. 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  Patient reports having blood work in anticipation to visit his nephrology when he was instructed to come to the emergency department for further evaluation and management. Patient reports approximately 1 week prior to admission he was experiencing increased short winded sensation and weight gain and his diuretic regimen was adjusted. Since then symptoms has resolved.  He was admitted for acute on stage IIIb chronic kidney disease.   Assessment & Plan:  Principal Problem:   Acute renal failure superimposed on stage 3b chronic kidney disease, unspecified acute renal failure type (HCC) Active Problems:   Persistent atrial fibrillation (HCC)   Chronic diastolic CHF (congestive heart failure) (HCC)   Uncontrolled type 2 diabetes mellitus with hyperglycemia, with long-term current use of insulin (HCC)   Hypertension, essential   Morbid obesity due to excess calories (HCC)   Chronic headaches   Hypokalemia    Acute renal failure superimposed on stage 3b chronic kidney disease, unspecified acute renal failure type (HCC) -In the setting of diuretic usage after receiving adjustment to treat exacerbation of his underlying CHF. -Treated with gentle IV fluids.  Creatinine has gradually improved from 3.47 > 3.18 > 2.44 -Baseline creatinine may be in the 1.3-1.4 range.  Thereby we will continue gentle IV fluids for additional night and  recheck BMP in AM.  Patient not volume overloaded. -Continue avoiding the use of nephrotoxic agents, contrast and hypotension   Persistent atrial fibrillation (HCC) -Rate controlled -Continue the use of Cardizem. -Eliquis was held on admission due to severity of renal failure.  Now that creatinine is improving, ordered Eliquis per pharmacy.  DC subcutaneous heparin.   Chronic diastolic CHF (congestive heart failure) (HCC) -Appears stable and compensated currently -Continue holding diuretics in the setting of acute renal failure.   Hypertension, essential -Continue treatment with Cardizem and hytrin   Uncontrolled type 2 diabetes mellitus with hyperglycemia, with long-term current use of insulin (HCC) -A1c 8.4 -Will continue holding [place] and Trulicity in the setting of worsening renal failure -Continue sliding scale insulin and Semglee, consider titrating up.  CBG mildly uncontrolled in the low 200s -Follow CBG fluctuation and adjust management as required.   Hypokalemia -Replace and follow.  Check magnesium in AM   Chronic headaches -Continue as needed Fioricet. -No headaches currently.  Body mass index is 43.84 kg/m./Very morbid obesity Lifestyle modifications and weight loss as outpatient  Pressure Ulcer: Pressure Injury 06/26/23 Buttocks Right Stage 2 -  Partial thickness loss of dermis presenting as a shallow open injury with a red, pink wound bed without slough. (Active)  06/26/23 1515  Location: Buttocks  Location Orientation: Right  Staging: Stage 2 -  Partial thickness loss of dermis presenting as a shallow open injury with a red, pink wound bed without slough.  Wound Description (Comments):   Present on Admission: Yes  Dressing Type Foam - Lift dressing to assess site every shift 06/28/23 1330     Pressure Injury 06/26/23 Buttocks Right  Stage 2 -  Partial thickness loss of dermis presenting as a shallow open injury with a red, pink wound bed without slough.  (Active)  06/26/23 1515  Location: Buttocks  Location Orientation: Right  Staging: Stage 2 -  Partial thickness loss of dermis presenting as a shallow open injury with a red, pink wound bed without slough.  Wound Description (Comments):   Present on Admission: Yes  Dressing Type Foam - Lift dressing to assess site every shift 06/28/23 1330    ACP Documents: None present DVT prophylaxis:   Will restart Eliquis   Code Status: DNR:  Family Communication: None at bedside Disposition:  Status is: Inpatient Remains inpatient appropriate because: IVF     Consultants:     Procedures:     Antimicrobials:      Subjective:  Generalized body pains.  From nursing facility.  Reports that both his lower extremities are wrapped 2 or 3 times a week.  No dyspnea or chest pain  Objective:   Vitals:   06/27/23 2109 06/28/23 0434 06/28/23 0943 06/28/23 1359  BP: 126/64 136/68 (!) 119/57 119/66  Pulse: 63 74 62 78  Resp: 19 16 18 17   Temp: 98.4 F (36.9 C) 98.3 F (36.8 C)  98.3 F (36.8 C)  TempSrc: Oral Oral    SpO2: 95% 94%  92%  Weight:  (!) 138.6 kg    Height:        General exam: Elderly male, moderately built and very morbidly obese lying comfortably propped up in bed without distress. Respiratory system: Clear to auscultation. Respiratory effort normal. Cardiovascular system: S1 & S2 heard, RRR. No JVD, murmurs, rubs, gallops or clicks. No pedal edema.  Telemetry personally reviewed: BBB morphology, A-fib with controlled ventricular rate and occasional PVCs. Gastrointestinal system: Abdomen is nondistended, soft and nontender. No organomegaly or masses felt. Normal bowel sounds heard. Central nervous system: Alert and oriented. No focal neurological deficits. Extremities: Symmetric 5 x 5 power.  Bilateral lower extremity with Ace wrapping. Skin: No rashes, lesions or ulcers Psychiatry: Judgement and insight appear normal. Mood & affect appropriate.     Data Reviewed:    I have personally reviewed following labs and imaging studies   CBC: Recent Labs  Lab 06/26/23 1251  WBC 9.9  NEUTROABS 7.3  HGB 16.5  HCT 51.3  MCV 75.2*  PLT 259    Basic Metabolic Panel: Recent Labs  Lab 06/26/23 1251 06/27/23 0446 06/28/23 0549  NA 125* 129* 131*  K 2.5* 2.8* 3.0*  CL 78* 84* 89*  CO2 31 31 30   GLUCOSE 353* 226* 248*  BUN 84* 81* 64*  CREATININE 3.47* 3.18* 2.44*  CALCIUM 8.0* 8.1* 8.0*  MG 2.3  --   --   PHOS 3.8 3.9  --     Liver Function Tests: Recent Labs  Lab 06/27/23 0446  ALBUMIN 2.9*    CBG: Recent Labs  Lab 06/28/23 0724 06/28/23 1125 06/28/23 1627  GLUCAP 213* 214* 221*    Microbiology Studies:   Recent Results (from the past 240 hour(s))  Microscopic Examination     Status: Abnormal   Collection Time: 06/19/23 10:46 AM   Urine  Result Value Ref Range Status   WBC, UA >30 (A) 0 - 5 /hpf Final   RBC, Urine 3-10 (A) 0 - 2 /hpf Final   Epithelial Cells (non renal) 0-10 0 - 10 /hpf Final   Bacteria, UA Few (A) None seen/Few Final  Urine Culture  Status: None   Collection Time: 06/19/23 11:52 AM   Specimen: Urine   UR  Result Value Ref Range Status   Urine Culture, Routine Final report  Final   Organism ID, Bacteria Comment  Final    Comment: Mixed urogenital flora 25,000-50,000 colony forming units per mL   MRSA Next Gen by PCR, Nasal     Status: Abnormal   Collection Time: 06/26/23  5:20 PM   Specimen: Nasal Mucosa; Nasal Swab  Result Value Ref Range Status   MRSA by PCR Next Gen DETECTED (A) NOT DETECTED Final    Comment: RESULT CALLED TO, READ BACK BY AND VERIFIED WITH: BROWN S ON 06/26/23 AT 2250 BY LOY,C (NOTE) The GeneXpert MRSA Assay (FDA approved for NASAL specimens only), is one component of a comprehensive MRSA colonization surveillance program. It is not intended to diagnose MRSA infection nor to guide or monitor treatment for MRSA infections. Test performance is not FDA approved in patients  less than 75 years old. Performed at [facility], [address], [place], [place] [address]     Radiology Studies:  No results found.  Scheduled Meds:    diltiazem  120 mg Oral Daily   febuxostat  40 mg Oral Daily   insulin aspart  0-5 Units Subcutaneous QHS   insulin aspart  0-9 Units Subcutaneous TID WC   insulin glargine-yfgn  20 Units Subcutaneous QHS   loratadine  10 mg Oral Daily   multivitamin with minerals  1 tablet Oral Daily   polyethylene glycol  17 g Oral Daily   senna-docusate  1 tablet Oral QHS   terazosin  10 mg Oral QHS   traMADol  50 mg Oral TID PC    Continuous Infusions:     LOS: 1 day     Marcellus Scott, MD,  FACP, FHM, SFHM, [facility], [facility]   [facility] Ad[facility] [place]      To contact the attending provider between 7A-7P or the covering provider during after hours 7P-7A, please log into the web site [email or web address] and access using universal  password for that web site. If you do not have the password, please call the hospital operator.  06/28/2023, 5:59 PM

## 2023-06-29 DIAGNOSIS — N179 Acute kidney failure, unspecified: Secondary | ICD-10-CM | POA: Diagnosis not present

## 2023-06-29 DIAGNOSIS — N1832 Chronic kidney disease, stage 3b: Secondary | ICD-10-CM | POA: Diagnosis not present

## 2023-06-29 LAB — BASIC METABOLIC PANEL
Anion gap: 10 (ref 5–15)
BUN: 52 mg/dL — ABNORMAL HIGH (ref 8–23)
CO2: 30 mmol/L (ref 22–32)
Calcium: 8.2 mg/dL — ABNORMAL LOW (ref 8.9–10.3)
Chloride: 92 mmol/L — ABNORMAL LOW (ref 98–111)
Creatinine, Ser: 2.08 mg/dL — ABNORMAL HIGH (ref 0.61–1.24)
GFR, Estimated: 33 mL/min — ABNORMAL LOW (ref >60)
Glucose, Bld: 173 mg/dL — ABNORMAL HIGH (ref 70–99)
Potassium: 3.4 mmol/L — ABNORMAL LOW (ref 3.5–5.1)
Sodium: 132 mmol/L — ABNORMAL LOW (ref 135–145)

## 2023-06-29 LAB — GLUCOSE, CAPILLARY
Glucose-Capillary: 175 mg/dL — ABNORMAL HIGH (ref 70–99)
Glucose-Capillary: 219 mg/dL — ABNORMAL HIGH (ref 70–99)
Glucose-Capillary: 199 mg/dL — ABNORMAL HIGH (ref 70–99)
Glucose-Capillary: 268 mg/dL — ABNORMAL HIGH (ref 70–99)

## 2023-06-29 MED ORDER — POTASSIUM CHLORIDE CRYS ER 20 MEQ PO TBCR
40.0000 meq | EXTENDED_RELEASE_TABLET | Freq: Once | ORAL | Status: AC
  Administered 2023-06-29: 40 meq via ORAL
  Filled 2023-06-29: qty 2

## 2023-06-29 MED ORDER — INSULIN ASPART 100 UNIT/ML IJ SOLN
0.0000 [IU] | Freq: Three times a day (TID) | INTRAMUSCULAR | Status: DC
  Administered 2023-06-30: 5 [IU] via SUBCUTANEOUS
  Administered 2023-06-30: 3 [IU] via SUBCUTANEOUS

## 2023-06-29 MED ORDER — INSULIN ASPART 100 UNIT/ML IJ SOLN
0.0000 [IU] | Freq: Every day | INTRAMUSCULAR | Status: DC
  Administered 2023-06-29: 3 [IU] via SUBCUTANEOUS

## 2023-06-29 NOTE — Progress Notes (Signed)
PROGRESS NOTE   [name]  BMW:[id number]    DOB: 1951-10-29    DOA: 06/26/2023  PCP: Charlynne Pander, MD   I have briefly reviewed patients previous medical records in [facility].  Chief Complaint  Patient presents with   abnormal labs    Brief Narrative:   72 y.o. 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  Patient reports having blood work in anticipation to visit his nephrology when he was instructed to come to the emergency department for further evaluation and management. Patient reports approximately 1 week prior to admission he was experiencing increased short winded sensation and weight gain and his diuretic regimen was adjusted. Since then symptoms has resolved.  He was admitted for acute on stage IIIb chronic kidney disease.  Diuretics were held, IV gently hydrated, creatinine gradually improving but not yet at baseline.  DC IV fluids and follow BMP in AM and hopefully can return to [facility] 8/26 with close monitoring of BMP at SNF and gradual resumption of diuretics.   Assessment & Plan:  Principal Problem:   Acute renal failure superimposed on stage 3b chronic kidney disease, unspecified acute renal failure type (HCC) Active Problems:   Persistent atrial fibrillation (HCC)   Chronic diastolic CHF (congestive heart failure) (HCC)   Uncontrolled type 2 diabetes mellitus with hyperglycemia, with long-term current use of insulin (HCC)   Hypertension, essential   Morbid obesity due to excess calories (HCC)   Chronic headaches   Hypokalemia    Acute renal failure superimposed on stage 3b chronic kidney disease, unspecified acute renal failure type (HCC) -In the setting of diuretic usage after receiving adjustment to treat  exacerbation of his underlying CHF. -Treated with gentle IV fluids.  Creatinine has gradually improved from 3.47 > 3.18 > 2.44 >2.08 -Monitor off of IV fluids.  Clinically not volume overloaded.  Check BMP in AM. -Baseline creatinine may be in the 1.3-1.4 range.   -Continue avoiding the use of nephrotoxic agents, contrast and hypotension  Hyponatremia: Suspect due to volume depletion. Presented with serum sodium of 125 which is gradually improved to 132.   Persistent atrial fibrillation (HCC) -Rate controlled -Continue the use of Cardizem. -Eliquis was held on admission due to severity of renal failure, has since been resumed.   Chronic diastolic CHF (congestive heart failure) (HCC) -Appears stable and compensated currently -Continue holding diuretics in the setting of acute renal failure. -At time of discharge, decision to be made whether to continue to hold diuretics for a day or 2 or gradually resume with close monitoring of BMP.   Hypertension, essential -Continue treatment with Cardizem and hytrin   Uncontrolled type 2 diabetes mellitus with hyperglycemia, with long-term current use of insulin (HCC) -A1c 8.4 -Will continue holding [place] and Trulicity in the setting of worsening renal failure -Continue sliding scale insulin and Semglee, consider titrating up.  CBG mildly uncontrolled in the low 200s   Hypokalemia -Continue to replace replace and follow.  Check magnesium in AM   Chronic headaches -Continue as needed Fioricet. -No headaches currently.  Chronic bilateral lymphedema (per patient report) Continue Una boots/Ace wraps  Body mass index is 43.21 kg/m./Very morbid obesity Lifestyle modifications and weight loss as outpatient  Pressure Ulcer: Pressure Injury 06/26/23 Buttocks Right Stage 2 -  Partial thickness loss of dermis presenting  as a shallow open injury with a red, pink wound bed without slough. (Active)  06/26/23 1515  Location: Buttocks  Location  Orientation: Right  Staging: Stage 2 -  Partial thickness loss of dermis presenting as a shallow open injury with a red, pink wound bed without slough.  Wound Description (Comments):   Present on Admission: Yes  Dressing Type Foam - Lift dressing to assess site every shift 06/29/23 0500     Pressure Injury 06/26/23 Buttocks Right Stage 2 -  Partial thickness loss of dermis presenting as a shallow open injury with a red, pink wound bed without slough. (Active)  06/26/23 1515  Location: Buttocks  Location Orientation: Right  Staging: Stage 2 -  Partial thickness loss of dermis presenting as a shallow open injury with a red, pink wound bed without slough.  Wound Description (Comments):   Present on Admission: Yes  Dressing Type Foam - Lift dressing to assess site every shift 06/29/23 0500    ACP Documents: None present DVT prophylaxis:   On Eliquis anticoagulation   Code Status: DNR:  Family Communication: None at bedside Disposition:  Status is: Inpatient Remains inpatient appropriate because: Monitoring BMP and hopefully can return to SNF tomorrow     Consultants:     Procedures:     Antimicrobials:      Subjective:  No complaints reported.  No pain or dyspnea.  Objective:   Vitals:   06/28/23 0943 06/28/23 1359 06/28/23 2100 06/29/23 0500  BP: (!) 119/57 119/66 (!) 140/89 105/64  Pulse: 62 78 76 (!) 51  Resp: 18 17 19 19   Temp:  98.3 F (36.8 C) 98 F (36.7 C) 98.1 F (36.7 C)  TempSrc:   Oral Oral  SpO2:  92% 90% 93%  Weight:    (!) 136.6 kg  Height:        General exam: Elderly male, moderately built and very morbidly obese lying comfortably propped up in bed without distress. Respiratory system: Clear to auscultation.  No increased work of breathing Cardiovascular system: S1 & S2 heard, RRR. No JVD, murmurs, rubs, gallops or clicks. No pedal edema.  Telemetry personally reviewed: BBB morphology, A-fib with controlled ventricular rate, variable  rate. Gastrointestinal system: Abdomen is nondistended, soft and nontender. No organomegaly or masses felt. Normal bowel sounds heard. Central nervous system: Alert and oriented. No focal neurological deficits. Extremities: Symmetric 5 x 5 power.  Bilateral lower extremity with Ace wrapping.  Dressing clean and dry. Skin: No rashes, lesions or ulcers Psychiatry: Judgement and insight appear normal. Mood & affect appropriate.     Data Reviewed:   I have personally reviewed following labs and imaging studies   CBC: Recent Labs  Lab 06/26/23 1251  WBC 9.9  NEUTROABS 7.3  HGB 16.5  HCT 51.3  MCV 75.2*  PLT 259    Basic Metabolic Panel: Recent Labs  Lab 06/26/23 1251 06/27/23 0446 06/28/23 0549 06/29/23 0508  NA 125* 129* 131* 132*  K 2.5* 2.8* 3.0* 3.4*  CL 78* 84* 89* 92*  CO2 31 31 30 30   GLUCOSE 353* 226* 248* 173*  BUN 84* 81* 64* 52*  CREATININE 3.47* 3.18* 2.44* 2.08*  CALCIUM 8.0* 8.1* 8.0* 8.2*  MG 2.3  --   --   --   PHOS 3.8 3.9  --   --     Liver Function Tests: Recent Labs  Lab 06/27/23 0446  ALBUMIN 2.9*    CBG: Recent Labs  Lab 06/28/23 2105 06/29/23 0736 06/29/23  1220  GLUCAP 242* 175* 219*    Microbiology Studies:   Recent Results (from the past 240 hour(s))  MRSA Next Gen by PCR, Nasal     Status: Abnormal   Collection Time: 06/26/23  5:20 PM   Specimen: Nasal Mucosa; Nasal Swab  Result Value Ref Range Status   MRSA by PCR Next Gen DETECTED (A) NOT DETECTED Final    Comment: RESULT CALLED TO, READ BACK BY AND VERIFIED WITH: BROWN S ON 06/26/23 AT 2250 BY LOY,C (NOTE) The GeneXpert MRSA Assay (FDA approved for NASAL specimens only), is one component of a comprehensive MRSA colonization surveillance program. It is not intended to diagnose MRSA infection nor to guide or monitor treatment for MRSA infections. Test performance is not FDA approved in patients less than 19 years old. Performed at [facility], [address]  [place], [place] [address]     Radiology Studies:  No results found.  Scheduled Meds:    apixaban  5 mg Oral BID   diltiazem  120 mg Oral Daily   docusate sodium  100 mg Oral BID   febuxostat  40 mg Oral Daily   insulin aspart  0-5 Units Subcutaneous QHS   insulin aspart  0-9 Units Subcutaneous TID WC   insulin glargine-yfgn  23 Units Subcutaneous QHS   loratadine  10 mg Oral Daily   multivitamin with minerals  1 tablet Oral Daily   polyethylene glycol  17 g Oral Daily   senna-docusate  1 tablet Oral QHS   terazosin  10 mg Oral QHS   traMADol  50 mg Oral TID PC    Continuous Infusions:     LOS: 2 days     Marcellus Scott, MD,  FACP, FHM, SFHM, [facility], [facility]   [facility] Ad[facility] [place]      To contact the attending provider between 7A-7P or the covering provider during after hours 7P-7A, please log into the web site [email or web address] and access using universal [place] password for that web site. If you do not have the password, please call the hospital operator.  06/29/2023, 4:24 PM

## 2023-06-29 NOTE — Plan of Care (Signed)
Pt alert and oriented x 4. Foams changed to buttocks and thighs. Pt refused to turn. Pt vitals stable. Meds whole. Fioricet given for headache and effective. Questioned pt regarding dressings to legs. He stated not to touch they would change when he goes back to facility.   Problem: Education: Goal: Knowledge of General Education information will improve Description: Including pain rating scale, medication(s)/side effects and non-pharmacologic comfort measures Outcome: Progressing   Problem: Health Behavior/Discharge Planning: Goal: Ability to manage health-related needs will improve Outcome: Progressing   Problem: Clinical Measurements: Goal: Ability to maintain clinical measurements within normal limits will improve Outcome: Progressing Goal: Will remain free from infection Outcome: Progressing Goal: Diagnostic test results will improve Outcome: Progressing Goal: Respiratory complications will improve Outcome: Progressing Goal: Cardiovascular complication will be avoided Outcome: Progressing   Problem: Activity: Goal: Risk for activity intolerance will decrease Outcome: Progressing   Problem: Nutrition: Goal: Adequate nutrition will be maintained Outcome: Progressing   Problem: Coping: Goal: Level of anxiety will decrease Outcome: Progressing   Problem: Elimination: Goal: Will not experience complications related to bowel motility Outcome: Progressing Goal: Will not experience complications related to urinary retention Outcome: Progressing   Problem: Pain Managment: Goal: General experience of comfort will improve Outcome: Progressing   Problem: Safety: Goal: Ability to remain free from injury will improve Outcome: Progressing   Problem: Skin Integrity: Goal: Risk for impaired skin integrity will decrease Outcome: Progressing   Problem: Education: Goal: Ability to describe self-care measures that may prevent or decrease complications (Diabetes Survival Skills  Education) will improve Outcome: Progressing Goal: Individualized Educational Video(s) Outcome: Progressing   Problem: Coping: Goal: Ability to adjust to condition or change in health will improve Outcome: Progressing   Problem: Fluid Volume: Goal: Ability to maintain a balanced intake and output will improve Outcome: Progressing   Problem: Health Behavior/Discharge Planning: Goal: Ability to identify and utilize available resources and services will improve Outcome: Progressing Goal: Ability to manage health-related needs will improve Outcome: Progressing   Problem: Metabolic: Goal: Ability to maintain appropriate glucose levels will improve Outcome: Progressing   Problem: Nutritional: Goal: Maintenance of adequate nutrition will improve Outcome: Progressing Goal: Progress toward achieving an optimal weight will improve Outcome: Progressing   Problem: Skin Integrity: Goal: Risk for impaired skin integrity will decrease Outcome: Progressing   Problem: Tissue Perfusion: Goal: Adequacy of tissue perfusion will improve Outcome: Progressing

## 2023-06-30 DIAGNOSIS — N1832 Chronic kidney disease, stage 3b: Secondary | ICD-10-CM | POA: Diagnosis not present

## 2023-06-30 DIAGNOSIS — N179 Acute kidney failure, unspecified: Secondary | ICD-10-CM | POA: Diagnosis not present

## 2023-06-30 LAB — MAGNESIUM: Magnesium: 2 mg/dL (ref 1.7–2.4)

## 2023-06-30 LAB — BASIC METABOLIC PANEL
Anion gap: 7 (ref 5–15)
BUN: 43 mg/dL — ABNORMAL HIGH (ref 8–23)
CO2: 31 mmol/L (ref 22–32)
Calcium: 8.2 mg/dL — ABNORMAL LOW (ref 8.9–10.3)
Chloride: 94 mmol/L — ABNORMAL LOW (ref 98–111)
Creatinine, Ser: 1.82 mg/dL — ABNORMAL HIGH (ref 0.61–1.24)
GFR, Estimated: 39 mL/min — ABNORMAL LOW (ref >60)
Glucose, Bld: 179 mg/dL — ABNORMAL HIGH (ref 70–99)
Potassium: 3.8 mmol/L (ref 3.5–5.1)
Sodium: 132 mmol/L — ABNORMAL LOW (ref 135–145)

## 2023-06-30 LAB — GLUCOSE, CAPILLARY
Glucose-Capillary: 192 mg/dL — ABNORMAL HIGH (ref 70–99)
Glucose-Capillary: 211 mg/dL — ABNORMAL HIGH (ref 70–99)

## 2023-06-30 MED ORDER — HYDROCODONE-ACETAMINOPHEN 5-325 MG PO TABS: 1.0000 | ORAL_TABLET | Freq: Four times a day (QID) | ORAL | 0 refills | Status: DC | PRN

## 2023-06-30 MED ORDER — TORSEMIDE 40 MG PO TABS: 40.0000 mg | ORAL_TABLET | Freq: Two times a day (BID) | ORAL | Status: DC

## 2023-06-30 MED ORDER — ONE-DAILY MULTI VITAMINS PO TABS: 1.0000 | ORAL_TABLET | Freq: Every day | ORAL | 0 refills | Status: AC

## 2023-06-30 MED ORDER — METOLAZONE 5 MG PO TABS: ORAL_TABLET | ORAL | Status: DC

## 2023-06-30 MED ORDER — TRAMADOL HCL 50 MG PO TABS: 50.0000 mg | ORAL_TABLET | Freq: Three times a day (TID) | ORAL | 0 refills | Status: AC

## 2023-06-30 NOTE — TOC Transition Note (Signed)
Transition of Care [facility]) - CM/SW Discharge Note   Patient Details  Name: [name] MRN: [id number] Date of Birth: 02-Mar-1951  Transition of Care [facility]) CM/SW Contact:  Annice Needy, LCSW Phone Number: 06/30/2023, 12:01 PM   Clinical Narrative:    Dc clinicals sent to family. Nurse to call report. EMS contacted to provide transport.    Final next level of care: Skilled Nursing Facility Barriers to Discharge: No Barriers Identified   Patient Goals and CMS Choice      Discharge Placement                Patient chooses bed at: [facility] Patient to be transferred to facility by: EMS Name of family member notified: brother, bill Patient and family notified of of transfer: 06/30/23  Discharge Plan and Services Additional resources added to the After Visit Summary for                                       Social Determinants of Health (SDOH) Interventions SDOH Screenings   Food Insecurity: No Food Insecurity (06/26/2023)  Housing: Low Risk  (06/26/2023)  Transportation Needs: No Transportation Needs (06/26/2023)  Utilities: Not At Risk (06/26/2023)  Alcohol Screen: Low Risk  (11/21/2021)  Depression (PHQ2-9): Low Risk  (10/10/2022)  Financial Resource Strain: Low Risk  (10/06/2022)  Physical Activity: Unknown (10/06/2022)  Recent Concern: Physical Activity - Inactive (10/06/2022)  Social Connections: Moderately Isolated (10/06/2022)  Stress: No Stress Concern Present (10/06/2022)  Tobacco Use: Low Risk  (06/27/2023)     Readmission Risk Interventions    04/26/2022    1:39 PM 02/15/2022    2:32 PM  Readmission Risk Prevention Plan  Transportation Screening Complete Complete  PCP or Specialist Appt within 5-7 Days Complete Complete  Home Care Screening Complete   Medication Review (RN CM) Complete

## 2023-06-30 NOTE — Inpatient Diabetes Management (Signed)
Inpatient Diabetes Program Recommendations  AACE/ADA: New Consensus Statement on Inpatient Glycemic Control   Target Ranges:  Prepandial:   less than 140 mg/dL      Peak postprandial:   less than 180 mg/dL (1-2 hours)      Critically ill patients:  140 - 180 mg/dL    Latest Reference Range & Units 06/29/23 07:36 06/29/23 12:20 06/29/23 17:02 06/29/23 21:00 06/30/23 07:47  Glucose-Capillary 70 - 99 mg/dL 161 (H) 096 (H) 045 (H) 268 (H) 192 (H)    Review of Glycemic Control  Current orders for Inpatient glycemic control: Novolog 0-15 units TID with meals, Novolog 0-5 units at bedtime, Semglee 23 units QHS  Inpatient Diabetes Program Recommendations:    Insulin: Please consider ordering Novolog 2 units TID with meals for meal coverage if patient eats at least 50% of meals.   Thanks, Orlando Penner, RN, MSN, CDCES Diabetes Coordinator Inpatient Diabetes Program [phone] (Team Pager from 8am to 5pm)

## 2023-06-30 NOTE — Plan of Care (Signed)

## 2023-06-30 NOTE — Care Management Important Message (Signed)
Important Message  Patient Details  Name: [name] MRN: [id number] Date of Birth: May 04, 1951   Medicare Important Message Given:  Yes (spoke with [name] at [phone] to review letter, no additional copy needed)     Corey Harold 06/30/2023, 12:17 PM

## 2023-06-30 NOTE — Progress Notes (Signed)
No acute events overnight. Kellogg RN

## 2023-06-30 NOTE — Discharge Summary (Signed)
Physician Discharge Summary  [name] WGN:[id number] DOB: 1951-01-07 DOA: 06/26/2023  PCP: Charlynne Pander, MD  Admit date: 06/26/2023  Discharge date: 06/30/2023  Admitted From:SNF  Disposition:  SNF  Recommendations for Outpatient Follow-up:  Follow up with PCP in 1-2 weeks Okay to resume Lasix and metolazone as previously prescribed on 8/28 Follow-up BMP in 3-5 days to ensure stability of creatinine levels with baseline near 1.3-1.4.  Noted to be 1.8 on day of discharge. Continue other home medications as prior. Continue lower extremity wrappings for chronic bilateral lymphedema  Home Health:None  Equipment/Devices:None  Discharge Condition:Stable  CODE STATUS: DNR  Diet recommendation: Heart Healthy/carb modified  Brief/Interim Summary: 72 y.o. 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  Patient reports having blood work in anticipation to visit his nephrology when he was instructed to come to the emergency department for further evaluation and management. Patient reports approximately 1 week prior to admission he was experiencing increased short winded sensation and weight gain and his diuretic regimen was adjusted. Since then symptoms has resolved.  He was admitted for acute on stage IIIb chronic kidney disease.  He was placed on IV fluid hydration and diuretics were held.  His creatinine gradually improved and is currently 1.8 on day of discharge with baseline near 1.3-1.4.  He may resume diuretics in the next couple days and have follow-up BMP outpatient as recommended in the next 3 to 5 days.  Discharge Diagnoses:  Principal Problem:   Acute renal failure superimposed on stage 3b chronic kidney disease, unspecified acute  renal failure type (HCC) Active Problems:   Persistent atrial fibrillation (HCC)   Chronic diastolic CHF (congestive heart failure) (HCC)   Uncontrolled type 2 diabetes mellitus with hyperglycemia, with long-term current use of insulin (HCC)   Hypertension, essential   Morbid obesity due to excess calories (HCC)   Chronic headaches   Hypokalemia  Principal discharge diagnosis: AKI on CKD stage IIIb likely secondary to aggressive diuresis along with associated hypovolemic hyponatremia.  Discharge Instructions  Discharge Instructions     Diet - low sodium heart healthy   Complete by: As directed    If the dressing is still on your incision site when you go home, remove it on the third day after your surgery date. Remove dressing if it begins to fall off, or if it is dirty or damaged before the third day.   Complete by: As directed    Increase activity slowly   Complete by: As directed       Allergies as of 06/30/2023       Reactions   Other Other (See Comments)   Sneezing, coughing   Hydralazine Other (See Comments)   Drowsiness/sedation/mental fog        Medication List     TAKE these medications    apixaban 5 MG Tabs tablet Commonly known as: ELIQUIS Take 5 mg by mouth 2 (two) times daily.   b complex vitamins capsule Take 1 capsule by mouth every morning.   B-D ULTRAFINE III SHORT PEN 31G X 8 MM Misc Generic drug: Insulin Pen Needle USE 1 PENNEEDLE TWICE DAILY   cetirizine 10 MG tablet Commonly known as: ZYRTEC Take 10 mg by mouth at bedtime.   dapagliflozin propanediol 10 MG Tabs tablet Commonly known as: Farxiga Take 1 tablet (10  mg total) by mouth daily before breakfast.   diltiazem 120 MG 24 hr capsule Commonly known as: Tiazac Take 1 capsule (120 mg total) by mouth daily.   docusate sodium 100 MG capsule Commonly known as: COLACE Take 100 mg by mouth 2 (two) times daily.   febuxostat 40 MG tablet Commonly known as: ULORIC Take 40 mg by mouth  daily.   ferrous sulfate 325 (65 FE) MG tablet Take 325 mg by mouth daily with breakfast.   HYDROcodone-acetaminophen 5-325 MG tablet Commonly known as: NORCO/VICODIN Take 1-2 tablets by mouth every 6 (six) hours as needed for moderate pain. 1-2 tabs po bid prn pain What changed:  how much to take when to take this reasons to take this   Insulin Lispro Prot & Lispro (75-25) 100 UNIT/ML Kwikpen Commonly known as: HUMALOG 75/25 MIX Inject 38 Units into the skin 2 (two) times daily.   lidocaine 5 % Commonly known as: LIDODERM Place 1 patch onto the skin daily. Remove & Discard patch within 12 hours or as directed by MD, use as needed for pain relief.   metolazone 5 MG tablet Commonly known as: ZAROXOLYN Take on Monday, Wednesday and Friday with Torsemide Start taking on: July 02, 2023 What changed: These instructions start on July 02, 2023. If you are unsure what to do until then, ask your doctor or other care provider.   miconazole 2 % powder Commonly known as: MICOTIN Apply 1 Application topically in the morning and at bedtime. To abdominal fold.   multivitamin tablet Take 1 tablet by mouth daily.   OneTouch Ultra test strip Generic drug: glucose blood CHECK BLOOD SUGAR TWICE  DAILY   potassium chloride SA 20 MEQ tablet Commonly known as: Klor-Con M20 Take one tablet three times per week. What changed:  how much to take how to take this when to take this   terazosin 10 MG capsule Commonly known as: HYTRIN Take 10 mg by mouth at bedtime.   testosterone cypionate 200 MG/ML injection Commonly known as: DEPOTESTOSTERONE CYPIONATE Inject 0.5 mLs into the muscle once a week. On Friday.   Torsemide 40 MG Tabs Take 40 mg by mouth 2 (two) times daily. Start taking on: July 02, 2023 What changed: These instructions start on July 02, 2023. If you are unsure what to do until then, ask your doctor or other care provider.   traMADol 50 MG tablet Commonly known as:  ULTRAM Take 1 tablet (50 mg total) by mouth in the morning, at noon, and at bedtime.   Trulicity 3 MG/0.5ML Sopn Generic drug: Dulaglutide Inject 3 mg as directed once a week.               Discharge Care Instructions  (From admission, onward)           Start     Ordered   06/30/23 0000  If the dressing is still on your incision site when you go home, remove it on the third day after your surgery date. Remove dressing if it begins to fall off, or if it is dirty or damaged before the third day.        06/30/23 0954            Contact information for follow-up providers     Charlynne Pander, MD. Schedule an appointment as soon as possible for a visit in 1 week(s).   Specialty: Internal Medicine Contact information: [address] [place] [address] [phone]  Contact information for after-discharge care     Destination     [facility] .   Service: Skilled Nursing Contact information: [address] [place] [address] [phone]                    Allergies  Allergen Reactions   Other Other (See Comments)    Sneezing, coughing   Hydralazine Other (See Comments)    Drowsiness/sedation/mental fog    Consultations: None   Procedures/Studies: US Renal  Result Date: 06/26/2023 CLINICAL DATA:  Acute kidney insufficiency. EXAM: RENAL / URINARY TRACT ULTRASOUND COMPLETE COMPARISON:  Renal stone CT 07/13/2022 FINDINGS: Right Kidney: Renal measurements: 11.2 x 6.2 x 6.2 cm = volume: 221.2 mL. Mild global parenchymal atrophy. No collecting system dilatation. Left Kidney: Renal measurements: 12.1 x 6.4 x 5.9 cm = volume: 241.6 mL. Small atrophic kidney without collecting system dilatation or perinephric fluid. There is rounded hypoechoic structure in the midportion of the kidney measuring 18 mm. This could be a cyst but is poorly defined on this examination. Bladder: Bladder is poorly seen.  Other: Study limited by overlapping bowel gas and soft tissue. IMPRESSION: Limited study with overlapping bowel gas and soft tissue. Bilateral renal atrophy No collecting system dilatation. Hypoechoic area along the mid left kidney is of uncertain etiology. Please correlate for any known history or dedicated workup when appropriate such as CT or MRI. Electronically Signed   By: Karen Kays M.D.   On: 06/26/2023 14:59   DG Chest Port 1 View  Result Date: 06/26/2023 CLINICAL DATA:  sob EXAM: PORTABLE CHEST 1 VIEW COMPARISON:  04/23/2022. FINDINGS: Bilateral lung fields are clear. No acute consolidation or major atelectasis. Bilateral costophrenic angles are clear. Stable cardio-mediastinal silhouette. Aortic arch calcifications noted. No acute osseous abnormalities. Probable distal resection of bilateral clavicles. The soft tissues are within normal limits. IMPRESSION: *No acute cardiopulmonary process. Aortic Atherosclerosis (ICD10-I70.0). Electronically Signed   By: Jules Schick M.D.   On: 06/26/2023 13:45     Discharge Exam: Vitals:   06/29/23 2042 06/30/23 0436  BP: 120/63 (!) 123/57  Pulse: (!) 56 (!) 56  Resp: 17   Temp: 98 F (36.7 C) 97.7 F (36.5 C)  SpO2: 92% 95%   Vitals:   06/29/23 1700 06/29/23 2042 06/30/23 0436 06/30/23 0500  BP: (!) 118/59 120/63 (!) 123/57   Pulse: 72 (!) 56 (!) 56   Resp: 20 17    Temp: 98.5 F (36.9 C) 98 F (36.7 C) 97.7 F (36.5 C)   TempSrc:  Oral Oral   SpO2: 92% 92% 95%   Weight:    (!) 138.4 kg  Height:        General: Pt is alert, awake, not in acute distress Cardiovascular: RRR, S1/S2 +, no rubs, no gallops Respiratory: CTA bilaterally, no wheezing, no rhonchi Abdominal: Soft, NT, ND, bowel sounds + Extremities: Bilateral lower extremity edema with lower extremity wrappings.    The results of significant diagnostics from this hospitalization (including imaging, microbiology, ancillary and laboratory) are listed below for reference.      Microbiology: Recent Results (from the past 240 hour(s))  MRSA Next Gen by PCR, Nasal     Status: Abnormal   Collection Time: 06/26/23  5:20 PM   Specimen: Nasal Mucosa; Nasal Swab  Result Value Ref Range Status   MRSA by PCR Next Gen DETECTED (A) NOT DETECTED Final    Comment: RESULT CALLED TO, READ BACK BY AND VERIFIED WITH:  BROWN S ON 06/26/23 AT 2250 BY LOY,C (NOTE) The GeneXpert MRSA Assay (FDA approved for NASAL specimens only), is one component of a comprehensive MRSA colonization surveillance program. It is not intended to diagnose MRSA infection nor to guide or monitor treatment for MRSA infections. Test performance is not FDA approved in patients less than 30 years old. Performed at [facility], [address] [place], [place] [address]      Labs: BNP (last 3 results) Recent Labs    06/26/23 1251  BNP 108.0*   Basic Metabolic Panel: Recent Labs  Lab 06/26/23 1251 06/27/23 0446 06/28/23 0549 06/29/23 0508 06/30/23 0555  NA 125* 129* 131* 132* 132*  K 2.5* 2.8* 3.0* 3.4* 3.8  CL 78* 84* 89* 92* 94*  CO2 31 31 30 30 31   GLUCOSE 353* 226* 248* 173* 179*  BUN 84* 81* 64* 52* 43*  CREATININE 3.47* 3.18* 2.44* 2.08* 1.82*  CALCIUM 8.0* 8.1* 8.0* 8.2* 8.2*  MG 2.3  --   --   --  2.0  PHOS 3.8 3.9  --   --   --    Liver Function Tests: Recent Labs  Lab 06/27/23 0446  ALBUMIN 2.9*   No results for input(s): "LIPASE", "AMYLASE" in the last 168 hours. No results for input(s): "AMMONIA" in the last 168 hours. CBC: Recent Labs  Lab 06/26/23 1251  WBC 9.9  NEUTROABS 7.3  HGB 16.5  HCT 51.3  MCV 75.2*  PLT 259   Cardiac Enzymes: No results for input(s): "CKTOTAL", "CKMB", "CKMBINDEX", "TROPONINI" in the last 168 hours. BNP: Invalid input(s): "POCBNP" CBG: Recent Labs  Lab 06/29/23 0736 06/29/23 1220 06/29/23 1702 06/29/23 2100 06/30/23 0747  GLUCAP 175* 219* 199* 268* 192*   D-Dimer No results for input(s): "DDIMER" in the last 72  hours. Hgb A1c No results for input(s): "HGBA1C" in the last 72 hours. Lipid Profile No results for input(s): "CHOL", "HDL", "LDLCALC", "TRIG", "CHOLHDL", "LDLDIRECT" in the last 72 hours. Thyroid function studies No results for input(s): "TSH", "T4TOTAL", "T3FREE", "THYROIDAB" in the last 72 hours.  Invalid input(s): "FREET3" Anemia work up No results for input(s): "VITAMINB12", "FOLATE", "FERRITIN", "TIBC", "IRON", "RETICCTPCT" in the last 72 hours. Urinalysis    Component Value Date/Time   COLORURINE STRAW (A) 06/26/2023 1335   APPEARANCEUR CLEAR 06/26/2023 1335   APPEARANCEUR Clear 06/19/2023 1046   LABSPEC 1.007 06/26/2023 1335   PHURINE 6.0 06/26/2023 1335   GLUCOSEU >=500 (A) 06/26/2023 1335   GLUCOSEU NEGATIVE 10/18/2020 1326   HGBUR MODERATE (A) 06/26/2023 1335   BILIRUBINUR NEGATIVE 06/26/2023 1335   BILIRUBINUR Negative 06/19/2023 1046   KETONESUR NEGATIVE 06/26/2023 1335   PROTEINUR NEGATIVE 06/26/2023 1335   UROBILINOGEN 1.0 10/11/2022 0818   UROBILINOGEN 1.0 10/18/2020 1326   NITRITE NEGATIVE 06/26/2023 1335   LEUKOCYTESUR LARGE (A) 06/26/2023 1335   Sepsis Labs Recent Labs  Lab 06/26/23 1251  WBC 9.9   Microbiology Recent Results (from the past 240 hour(s))  MRSA Next Gen by PCR, Nasal     Status: Abnormal   Collection Time: 06/26/23  5:20 PM   Specimen: Nasal Mucosa; Nasal Swab  Result Value Ref Range Status   MRSA by PCR Next Gen DETECTED (A) NOT DETECTED Final    Comment: RESULT CALLED TO, READ BACK BY AND VERIFIED WITH: BROWN S ON 06/26/23 AT 2250 BY LOY,C (NOTE) The GeneXpert MRSA Assay (FDA approved for NASAL specimens only), is one component of a comprehensive MRSA colonization surveillance program. It is not intended to  diagnose MRSA infection nor to guide or monitor treatment for MRSA infections. Test performance is not FDA approved in patients less than 42 years old. Performed at [facility], [address] [place], [place] [address]       Time coordinating discharge: 35 minutes  SIGNED:   Erick Blinks, DO [facility] 06/30/2023, 10:05 AM  If 7PM-7AM, please contact night-coverage [email or web address]

## 2023-07-03 ENCOUNTER — Telehealth (INDEPENDENT_AMBULATORY_CARE_PROVIDER_SITE_OTHER): Payer: Self-pay

## 2023-07-03 NOTE — Telephone Encounter (Signed)
Patient home health nurse Jae Dire called concerned with patient having cold feet, some swelling, discoloration, and not able feel strong pulse. Patient was recently discharge from the hospital. The nurse stated that the patient legs were wrapped to tight. She informed that patient feet were soak in warm water yesterday but not much difference. She did speak with the provider at the facility and he is familiar with patient medical hx and he is recommending the patient to elevate above heart level. I spoke with Dr Gilda Crease and he recommended for the patient to evaluated at the ER or patient could come in for ABI follow with provider. Patient is schedule to come in tomorrow.

## 2023-07-04 ENCOUNTER — Other Ambulatory Visit (INDEPENDENT_AMBULATORY_CARE_PROVIDER_SITE_OTHER): Payer: Self-pay | Admitting: Nurse Practitioner

## 2023-07-04 ENCOUNTER — Ambulatory Visit (INDEPENDENT_AMBULATORY_CARE_PROVIDER_SITE_OTHER): Payer: 59 | Admitting: Nurse Practitioner

## 2023-07-04 ENCOUNTER — Encounter (INDEPENDENT_AMBULATORY_CARE_PROVIDER_SITE_OTHER): Payer: Self-pay | Admitting: Nurse Practitioner

## 2023-07-04 ENCOUNTER — Ambulatory Visit (INDEPENDENT_AMBULATORY_CARE_PROVIDER_SITE_OTHER): Payer: 59

## 2023-07-04 VITALS — BP 110/66 | HR 57 | Resp 16

## 2023-07-04 DIAGNOSIS — Z794 Long term (current) use of insulin: Secondary | ICD-10-CM | POA: Diagnosis not present

## 2023-07-04 DIAGNOSIS — I70223 Atherosclerosis of native arteries of extremities with rest pain, bilateral legs: Secondary | ICD-10-CM | POA: Diagnosis not present

## 2023-07-04 DIAGNOSIS — I10 Essential (primary) hypertension: Secondary | ICD-10-CM | POA: Diagnosis not present

## 2023-07-04 DIAGNOSIS — I890 Lymphedema, not elsewhere classified: Secondary | ICD-10-CM | POA: Diagnosis not present

## 2023-07-04 DIAGNOSIS — E1140 Type 2 diabetes mellitus with diabetic neuropathy, unspecified: Secondary | ICD-10-CM | POA: Diagnosis not present

## 2023-07-08 ENCOUNTER — Encounter (INDEPENDENT_AMBULATORY_CARE_PROVIDER_SITE_OTHER): Payer: Self-pay | Admitting: Nurse Practitioner

## 2023-07-08 NOTE — Progress Notes (Signed)
Subjective:    Patient ID: [name], male    DOB: 14-May-1951, 72 y.o.   MRN: [id number] Chief Complaint  Patient presents with   Follow-up    Ultrasound follow up    The patient presents today after a call from his nursing facility noting that his feet were cold blue and pale.  They note that this happened following removal of a compression bandage after being hospitalized.  The patient notes that once the bandages removed his legs began to improve however he is like to began to feel much better.  He does have some evidence of swelling as well as some ulcerative areas.  But he has been removed from wraps due to concern for possible arterial insufficiency.  Today noninvasive study noncompressible ABIs bilaterally with normal TBI's bilaterally.  He has triphasic/biphasic tibial vessel waveforms with normal toe waveforms bilaterally.    Review of Systems  Cardiovascular:  Positive for leg swelling.  Musculoskeletal:  Positive for gait problem.  Skin:  Positive for wound.  All other systems reviewed and are negative.      Objective:   Physical Exam Vitals reviewed.  HENT:     Head: Normocephalic.  Cardiovascular:     Rate and Rhythm: Normal rate.     Pulses:          Dorsalis pedis pulses are detected w/ Doppler on the right side and detected w/ Doppler on the left side.       Posterior tibial pulses are detected w/ Doppler on the right side and detected w/ Doppler on the left side.  Pulmonary:     Effort: Pulmonary effort is normal.  Skin:    General: Skin is warm and dry.  Neurological:     Mental Status: He is alert and oriented to person, place, and time.  Psychiatric:        Mood and Affect: Mood normal.        Behavior: Behavior normal.        Thought Content: Thought content normal.        Judgment: Judgment normal.     BP 110/66 (BP Location: Left Arm)   Pulse (!) 57   Resp 16   Past Medical History:  Diagnosis Date   ALLERGIC RHINITIS 08/11/2006    ASTHMA 08/11/2006   Bradycardia    Beta blocker d/c'd 2022   Chronic atrial fibrillation (HCC) 08/2014   Chronic combined systolic and diastolic CHF (congestive heart failure) [facility])    Cardiology f/u 12/2017: pt volume overloaded (R heart dysf suspected), BNP very high, lasix increased.  Repeat echo 12/2017: normal LV EF, mild DD, +RV syst dysfxn, mod pulm HTN, biatrial enlgmt.   Chronic constipation    Chronic renal insufficiency, stage 3 (moderate) (HCC)    Colon cancer screening 02/2021   02/2021 Cologuard POS->GI ref   DIABETES MELLITUS, TYPE II 08/11/2006   HYPERTENSION 08/11/2006   IDA (iron deficiency anemia)    04/2022.  Iron started.  Needs hemoccults   Impaired mobility and endurance    MRSA infection 06/2018   LL venous stasis ulcer infected   Normocytic anemia 2016-2019   03/2018 B12 normal, iron ok (ferritin borderline low).   OBESITY, MORBID 12/14/2007   saxenda started 05/2020 by [facility] wt mgmt center   OSA (obstructive sleep apnea)    to get sleep study with [facility]) as of 12/03/2018 consult.   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  [id number] ID started him on amoxil prophylaxis---ineffective.  End 2018/Jan 2019 penicillin G injections prophyl helpful but pt declined to continue this as of 01/2018 ID f/u.  ID talked him into resuming monthly penicillin G as of 02/2018 f/u.     Restless leg syndrome    Rx'd clonazepam 09/2017 and pt refused to take it after reading the medication's potential side effects.   Venous stasis ulcers of both lower extremities (HCC)    Severe lymphedema.  wound clinic care ongoing as of 01/2018    Social History   Socioeconomic History   Marital status: Single    Spouse name: Not on file   Number of children: 0   Years of education: 14   Highest education level: Bachelor's degree (e.g., BA, AB, BS)  Occupational History   Occupation: retired  Tobacco Use    Smoking status: Never    Passive exposure: Never   Smokeless tobacco: Never  Vaping Use   Vaping status: Never Used  Substance and Sexual Activity   Alcohol use: No   Drug use: No   Sexual activity: Not Currently  Other Topics Concern   Not on file  Social History Narrative   Single, no children.   Lives in [place] since 1985 [place] from [place]   Occupation: Science writer for [employer].   No Tobacco, no alc, drugs.   Exercise: no    Diet: no      Social Determinants of Health   Financial Resource Strain: Low Risk  (10/06/2022)   Overall Financial Resource Strain (CARDIA)    Difficulty of Paying Living Expenses: Not hard at all  Food Insecurity: No Food Insecurity (06/26/2023)   Hunger Vital Sign    Worried About Running Out of Food in the Last Year: Never true    Ran Out of Food in the Last Year: Never true  Transportation Needs: No Transportation Needs (06/26/2023)   PRAPARE - Administrator, Civil Service (Medical): No    Lack of Transportation (Non-Medical): No  Physical Activity: Inactive (10/06/2022)   Exercise Vital Sign    Days of Exercise per Week: 0 days    Minutes of Exercise per Session: 0 min  Stress: No Stress Concern Present (10/06/2022)   [employer] of Occupational Health - Occupational Stress Questionnaire    Feeling of Stress : Only a little  Social Connections: Moderately Isolated (10/06/2022)   Social Connection and Isolation Panel [NHANES]    Frequency of Communication with Friends and Family: Once a week    Frequency of Social Gatherings with Friends and Family: Never    Attends Religious Services: More than 4 times per year    Active Member of [employer] or Organizations: Yes    Attends Banker Meetings: More than 4 times per year    Marital Status: Never married  Intimate Partner Violence: Not At Risk (06/26/2023)   Humiliation, Afraid, Rape, and Kick questionnaire    Fear of Current or Ex-Partner: No     Emotionally Abused: No    Physically Abused: No    Sexually Abused: No    Past Surgical History:  Procedure Laterality Date   Carotid dopplers  07/23/2018   Left NORMAL.  Right 1-39% ICA stenosis, with <50% distal CCA stenosis (not hemodynamically significant)   LE venous dopplers Bilateral 03/06/2022   no dvt   RIGHT HEART CATH N/A 02/19/2022   Marked volume overload, R>>L HF, preserved CO. Procedure: RIGHT HEART CATH;  Surgeon: Dolores Patty, MD;  Location: [facility] INVASIVE CV LAB;  Service: Cardiovascular;  Laterality: N/A;   TRANSTHORACIC ECHOCARDIOGRAM  11/2007; 09/2014; 11/2015;12/2017   LV fxn normal, EF normal, mild dilation of left atrium.  2015 grade II diast dysfxn.  2017 EF 55-60%. 2019: LVEF 60-65%, mild RV syst dysf,biatrial enlgmt, mod pulm htn. 02/14/22 EF 55-60%, unable to assess diast fxn, LVH, o/w normal   URETERAL STENT PLACEMENT     virtual colonoscopy  01/2011   Normal    Family History  Problem Relation Age of Onset   Diabetes Mother    Diabetes Sister    Hydrocephalus Sister        NPH    Allergies  Allergen Reactions   Other Other (See Comments)    Sneezing, coughing   Hydralazine Other (See Comments)    Drowsiness/sedation/mental fog       Latest Ref Rng & Units 06/26/2023   12:51 PM 12/23/2022   10:43 AM 10/10/2022    2:48 PM  CBC  WBC 4.0 - 10.5 K/uL 9.9  9.0  9.6   Hemoglobin 13.0 - 17.0 g/dL 95.6  21.3  08.6   Hematocrit 39.0 - 52.0 % 51.3  43.5  39.7   Platelets 150 - 400 K/uL 259  268  219.0       CMP     Component Value Date/Time   NA 132 (L) 06/30/2023 0555   NA 134 12/23/2022 1043   K 3.8 06/30/2023 0555   CL 94 (L) 06/30/2023 0555   CO2 31 06/30/2023 0555   GLUCOSE 179 (H) 06/30/2023 0555   BUN 43 (H) 06/30/2023 0555   BUN 46 (H) 12/23/2022 1043   CREATININE 1.82 (H) 06/30/2023 0555   CREATININE 1.57 (H) 04/04/2021 0844   CALCIUM 8.2 (L) 06/30/2023 0555   PROT 6.4 12/23/2022 1043   ALBUMIN 2.9 (L) 06/27/2023 0446    ALBUMIN 3.5 (L) 12/23/2022 1043   AST 20 12/23/2022 1043   ALT 11 12/23/2022 1043   ALKPHOS 119 12/23/2022 1043   BILITOT 0.6 12/23/2022 1043   GFR 51.90 (L) 10/10/2022 1448   EGFR 31.0 04/01/2023 0842   EGFR 44 (L) 12/23/2022 1043   GFRNONAA 39 (L) 06/30/2023 0555     No results found.     Assessment & Plan:   1. Lymphedema Today noninvasive studies show no evidence of any arterial insufficiency.  Patient is able to adequately dermatologist utilize out of [place] boots or any form of compression based on his studies today.  This will be helpful in controlling his swelling and healing his minor venous wounds.  He should also have adequate circulation to do so.  He has an upcoming visit in October.  Will keep this visit in order to evaluate his progress with his edema.  2. Type 2 diabetes mellitus with diabetic neuropathy, with long-term current use of insulin (HCC) Continue hypoglycemic medications as already ordered, these medications have been reviewed and there are no changes at this time.  Hgb A1C to be monitored as already arranged by primary service  3. Hypertension, essential Continue antihypertensive medications as already ordered, these medications have been reviewed and there are no changes at this time.   Current Outpatient Medications on File Prior to Visit  Medication Sig Dispense Refill   apixaban (ELIQUIS) 5 MG TABS tablet Take 5 mg by mouth 2 (two) times daily.     b complex vitamins capsule Take 1 capsule by mouth every morning.  cetirizine (ZYRTEC) 10 MG tablet Take 10 mg by mouth at bedtime.     dapagliflozin propanediol (FARXIGA) 10 MG TABS tablet Take 1 tablet (10 mg total) by mouth daily before breakfast. 30 tablet 5   diltiazem (TIAZAC) 120 MG 24 hr capsule Take 1 capsule (120 mg total) by mouth daily. 90 capsule 3   docusate sodium (COLACE) 100 MG capsule Take 100 mg by mouth 2 (two) times daily.     Dulaglutide (TRULICITY) 3 MG/0.5ML SOPN Inject 3 mg as  directed once a week. 6 mL 3   febuxostat (ULORIC) 40 MG tablet Take 40 mg by mouth daily.     ferrous sulfate 325 (65 FE) MG tablet Take 325 mg by mouth daily with breakfast.     glucose blood (ONETOUCH ULTRA) test strip CHECK BLOOD SUGAR TWICE  DAILY 200 strip 0   HYDROcodone-acetaminophen (NORCO/VICODIN) 5-325 MG tablet Take 1-2 tablets by mouth every 6 (six) hours as needed for moderate pain. 1-2 tabs po bid prn pain 5 tablet 0   Insulin Lispro Prot & Lispro (HUMALOG 75/25 MIX) (75-25) 100 UNIT/ML Kwikpen Inject 38 Units into the skin 2 (two) times daily.     Insulin Pen Needle (B-D ULTRAFINE III SHORT PEN) 31G X 8 MM MISC USE 1 PENNEEDLE TWICE DAILY 200 each 1   lidocaine (LIDODERM) 5 % Place 1 patch onto the skin daily. Remove & Discard patch within 12 hours or as directed by MD, use as needed for pain relief.     metolazone (ZAROXOLYN) 5 MG tablet Take on Monday, Wednesday and Friday with Torsemide     miconazole (MICOTIN) 2 % powder Apply 1 Application topically in the morning and at bedtime. To abdominal fold.     Multiple Vitamin (MULTIVITAMIN) tablet Take 1 tablet by mouth daily. 30 tablet 0   potassium chloride SA (KLOR-CON M20) 20 MEQ tablet Take one tablet three times per week. (Patient taking differently: Take 20 mEq by mouth every Monday, Wednesday, and Friday. Take one tablet three times per week.) 45 tablet 2   terazosin (HYTRIN) 10 MG capsule Take 10 mg by mouth at bedtime.     testosterone cypionate (DEPOTESTOSTERONE CYPIONATE) 200 MG/ML injection Inject 0.5 mLs into the muscle once a week. On Friday.     Torsemide 40 MG TABS Take 40 mg by mouth 2 (two) times daily.     traMADol (ULTRAM) 50 MG tablet Take 1 tablet (50 mg total) by mouth in the morning, at noon, and at bedtime. 10 tablet 0   Current Facility-Administered Medications on File Prior to Visit  Medication Dose Route Frequency Provider Last Rate Last Admin   bupivacaine (MARCAINE) 0.5 % (with pres) injection 3 mL  3  mL Other Once Tyrell Antonio, MD        There are no Patient Instructions on file for this visit. No follow-ups on file.   Georgiana Spinner, NP

## 2023-07-10 LAB — VAS US ABI WITH/WO TBI

## 2023-07-28 ENCOUNTER — Telehealth: Payer: Self-pay | Admitting: Internal Medicine

## 2023-07-28 NOTE — Telephone Encounter (Signed)
[name] called to scheduled an appointment with Dr. Milinda Cave. However, he is still living in the rehab facility.  This was an issue a while ago with the provider at the facility only being able to treat [name]. [name] has not received proper care in this facility he has been trying to see Dr. Joeseph Amor for 2 weeks in regards to his leg and body cramps. He reports that he has contacted his insurance company and Dr. Joeseph Amor is not even covered as his PCP and that Dr. Milinda Cave is still covered as his PCP. I have [name] scheduled for 10/02 at 10:20. I wanted to confirm it was ok to have him scheduled prior to arranging transportation? He once again is not getting proper care and the facility stated he needed a dr that was on call 24/7 however it doesn't appear the Dr on duty is availabel 24/7. Please let me know if I can arrange transportation for [facility] and that this is ok for him to be seen by Dr. Milinda Cave.

## 2023-07-31 NOTE — Progress Notes (Deleted)
Aleen Sells D.[facility] [address] [place] [address] Phone: [phone]   Assessment and Plan:     There are no diagnoses linked to this encounter.  ***   Pertinent previous records reviewed include ***   Follow Up: ***     Subjective:   I, Julieanne Hadsall, am serving as a Neurosurgeon for Doctor Richardean Sale  Chief Complaint: right shoulder pain  HPI:   08/01/2023 Patient is  72 year old male complaining of right shoulder pain. Patient states  Relevant Historical Information: ***  Additional pertinent review of systems negative.   Current Outpatient Medications:    apixaban (ELIQUIS) 5 MG TABS tablet, Take 5 mg by mouth 2 (two) times daily., Disp: , Rfl:    b complex vitamins capsule, Take 1 capsule by mouth every morning., Disp: , Rfl:    cetirizine (ZYRTEC) 10 MG tablet, Take 10 mg by mouth at bedtime., Disp: , Rfl:    dapagliflozin propanediol (FARXIGA) 10 MG TABS tablet, Take 1 tablet (10 mg total) by mouth daily before breakfast., Disp: 30 tablet, Rfl: 5   diltiazem (TIAZAC) 120 MG 24 hr capsule, Take 1 capsule (120 mg total) by mouth daily., Disp: 90 capsule, Rfl: 3   docusate sodium (COLACE) 100 MG capsule, Take 100 mg by mouth 2 (two) times daily., Disp: , Rfl:    Dulaglutide (TRULICITY) 3 MG/0.5ML SOPN, Inject 3 mg as directed once a week., Disp: 6 mL, Rfl: 3   febuxostat (ULORIC) 40 MG tablet, Take 40 mg by mouth daily., Disp: , Rfl:    ferrous sulfate 325 (65 FE) MG tablet, Take 325 mg by mouth daily with breakfast., Disp: , Rfl:    glucose blood (ONETOUCH ULTRA) test strip, CHECK BLOOD SUGAR TWICE  DAILY, Disp: 200 strip, Rfl: 0   HYDROcodone-acetaminophen (NORCO/VICODIN) 5-325 MG tablet, Take 1-2 tablets by mouth every 6 (six) hours as needed for moderate pain. 1-2 tabs po bid prn pain, Disp: 5 tablet, Rfl: 0   Insulin Lispro Prot & Lispro (HUMALOG 75/25 MIX) (75-25) 100 UNIT/ML Kwikpen, Inject 38 Units into the  skin 2 (two) times daily., Disp: , Rfl:    Insulin Pen Needle (B-D ULTRAFINE III SHORT PEN) 31G X 8 MM MISC, USE 1 PENNEEDLE TWICE DAILY, Disp: 200 each, Rfl: 1   lidocaine (LIDODERM) 5 %, Place 1 patch onto the skin daily. Remove & Discard patch within 12 hours or as directed by MD, use as needed for pain relief., Disp: , Rfl:    metolazone (ZAROXOLYN) 5 MG tablet, Take on Monday, Wednesday and Friday with Torsemide, Disp: , Rfl:    miconazole (MICOTIN) 2 % powder, Apply 1 Application topically in the morning and at bedtime. To abdominal fold., Disp: , Rfl:    Multiple Vitamin (MULTIVITAMIN) tablet, Take 1 tablet by mouth daily., Disp: 30 tablet, Rfl: 0   potassium chloride SA (KLOR-CON M20) 20 MEQ tablet, Take one tablet three times per week. (Patient taking differently: Take 20 mEq by mouth every Monday, Wednesday, and Friday. Take one tablet three times per week.), Disp: 45 tablet, Rfl: 2   terazosin (HYTRIN) 10 MG capsule, Take 10 mg by mouth at bedtime., Disp: , Rfl:    testosterone cypionate (DEPOTESTOSTERONE CYPIONATE) 200 MG/ML injection, Inject 0.5 mLs into the muscle once a week. On Friday., Disp: , Rfl:    Torsemide 40 MG TABS, Take 40 mg by mouth 2 (two) times daily., Disp: , Rfl:  traMADol (ULTRAM) 50 MG tablet, Take 1 tablet (50 mg total) by mouth in the morning, at noon, and at bedtime., Disp: 10 tablet, Rfl: 0  Current Facility-Administered Medications:    bupivacaine (MARCAINE) 0.5 % (with pres) injection 3 mL, 3 mL, Other, Once, Tyrell Antonio, MD   Objective:     There were no vitals filed for this visit.    There is no height or weight on file to calculate BMI.    Physical Exam:    ***   Electronically signed by:  Aleen Sells D.[facility] 4:10 PM 07/31/23

## 2023-08-01 ENCOUNTER — Ambulatory Visit: Payer: 59 | Admitting: Sports Medicine

## 2023-08-05 ENCOUNTER — Telehealth: Payer: Self-pay | Admitting: Internal Medicine

## 2023-08-05 NOTE — Telephone Encounter (Signed)
Pt would like a transfer of care from Dr Marvel Plan to Dr Jon Billings. Please advise.

## 2023-08-05 NOTE — Telephone Encounter (Signed)
No further action needed,

## 2023-08-05 NOTE — Telephone Encounter (Signed)
Patient called back regarding his appointment for tomorrow. He is aware he can not continue his care with Dr. Milinda Cave. He stated he was going to call his insurance company and get assistance with finding a new provider.

## 2023-08-06 ENCOUNTER — Ambulatory Visit: Payer: 59 | Admitting: Family Medicine

## 2023-08-06 NOTE — Progress Notes (Unsigned)
[name] D.[facility] [address] [place] [address] Phone: [phone]   Assessment and Plan:     There are no diagnoses linked to this encounter.  ***   Pertinent previous records reviewed include ***   Follow Up: ***     Subjective:   I, [name], am serving as a Neurosurgeon for Doctor Richardean Sale  Chief Complaint: right shoulder pain   HPI:   08/07/2023 Patient is a 72 year old male complaining of right shoulder pain. Patient states   Relevant Historical Information: ***  Additional pertinent review of systems negative.   Current Outpatient Medications:    apixaban (ELIQUIS) 5 MG TABS tablet, Take 5 mg by mouth 2 (two) times daily., Disp: , Rfl:    b complex vitamins capsule, Take 1 capsule by mouth every morning., Disp: , Rfl:    cetirizine (ZYRTEC) 10 MG tablet, Take 10 mg by mouth at bedtime., Disp: , Rfl:    dapagliflozin propanediol (FARXIGA) 10 MG TABS tablet, Take 1 tablet (10 mg total) by mouth daily before breakfast., Disp: 30 tablet, Rfl: 5   diltiazem (TIAZAC) 120 MG 24 hr capsule, Take 1 capsule (120 mg total) by mouth daily., Disp: 90 capsule, Rfl: 3   docusate sodium (COLACE) 100 MG capsule, Take 100 mg by mouth 2 (two) times daily., Disp: , Rfl:    Dulaglutide (TRULICITY) 3 MG/0.5ML SOPN, Inject 3 mg as directed once a week., Disp: 6 mL, Rfl: 3   febuxostat (ULORIC) 40 MG tablet, Take 40 mg by mouth daily., Disp: , Rfl:    ferrous sulfate 325 (65 FE) MG tablet, Take 325 mg by mouth daily with breakfast., Disp: , Rfl:    glucose blood (ONETOUCH ULTRA) test strip, CHECK BLOOD SUGAR TWICE  DAILY, Disp: 200 strip, Rfl: 0   HYDROcodone-acetaminophen (NORCO/VICODIN) 5-325 MG tablet, Take 1-2 tablets by mouth every 6 (six) hours as needed for moderate pain. 1-2 tabs po bid prn pain, Disp: 5 tablet, Rfl: 0   Insulin Lispro Prot & Lispro (HUMALOG 75/25 MIX) (75-25) 100 UNIT/ML Kwikpen, Inject 38 Units into  the skin 2 (two) times daily., Disp: , Rfl:    Insulin Pen Needle (B-D ULTRAFINE III SHORT PEN) 31G X 8 MM MISC, USE 1 PENNEEDLE TWICE DAILY, Disp: 200 each, Rfl: 1   lidocaine (LIDODERM) 5 %, Place 1 patch onto the skin daily. Remove & Discard patch within 12 hours or as directed by MD, use as needed for pain relief., Disp: , Rfl:    metolazone (ZAROXOLYN) 5 MG tablet, Take on Monday, Wednesday and Friday with Torsemide, Disp: , Rfl:    miconazole (MICOTIN) 2 % powder, Apply 1 Application topically in the morning and at bedtime. To abdominal fold., Disp: , Rfl:    Multiple Vitamin (MULTIVITAMIN) tablet, Take 1 tablet by mouth daily., Disp: 30 tablet, Rfl: 0   potassium chloride SA (KLOR-CON M20) 20 MEQ tablet, Take one tablet three times per week. (Patient taking differently: Take 20 mEq by mouth every Monday, Wednesday, and Friday. Take one tablet three times per week.), Disp: 45 tablet, Rfl: 2   terazosin (HYTRIN) 10 MG capsule, Take 10 mg by mouth at bedtime., Disp: , Rfl:    testosterone cypionate (DEPOTESTOSTERONE CYPIONATE) 200 MG/ML injection, Inject 0.5 mLs into the muscle once a week. On Friday., Disp: , Rfl:    Torsemide 40 MG TABS, Take 40 mg by mouth 2 (two) times daily., Disp: , Rfl:  traMADol (ULTRAM) 50 MG tablet, Take 1 tablet (50 mg total) by mouth in the morning, at noon, and at bedtime., Disp: 10 tablet, Rfl: 0  Current Facility-Administered Medications:    bupivacaine (MARCAINE) 0.5 % (with pres) injection 3 mL, 3 mL, Other, Once, Kevin Antonio, MD   Objective:     There were no vitals filed for this visit.    There is no height or weight on file to calculate BMI.    Physical Exam:    ***   Electronically signed by:  [name] D.[facility] 7:42 AM 08/06/23

## 2023-08-06 NOTE — Telephone Encounter (Signed)
Called pt and left message. All set.

## 2023-08-06 NOTE — Telephone Encounter (Signed)
Pt transfer is ok with me.

## 2023-08-07 ENCOUNTER — Ambulatory Visit: Payer: 59

## 2023-08-07 ENCOUNTER — Ambulatory Visit: Payer: 59 | Admitting: Sports Medicine

## 2023-08-07 VITALS — HR 61 | Ht 70.0 in | Wt 305.0 lb

## 2023-08-07 DIAGNOSIS — G8929 Other chronic pain: Secondary | ICD-10-CM | POA: Diagnosis not present

## 2023-08-07 DIAGNOSIS — M75111 Incomplete rotator cuff tear or rupture of right shoulder, not specified as traumatic: Secondary | ICD-10-CM

## 2023-08-07 DIAGNOSIS — M25511 Pain in right shoulder: Secondary | ICD-10-CM | POA: Diagnosis not present

## 2023-08-07 DIAGNOSIS — M19011 Primary osteoarthritis, right shoulder: Secondary | ICD-10-CM

## 2023-08-07 NOTE — Patient Instructions (Signed)
PT referral Recommend physical therapy 1-2 per week for 6 weeks. For right shoulder and rotator cuff Can use tylenol for pain relief  6 week follow up

## 2023-08-14 ENCOUNTER — Ambulatory Visit (INDEPENDENT_AMBULATORY_CARE_PROVIDER_SITE_OTHER): Payer: 59 | Admitting: Vascular Surgery

## 2023-08-15 ENCOUNTER — Encounter: Payer: Self-pay | Admitting: Family Medicine

## 2023-08-15 ENCOUNTER — Ambulatory Visit: Payer: 59 | Admitting: Family Medicine

## 2023-08-15 VITALS — BP 110/64 | HR 54 | Temp 97.6°F | Ht 70.0 in | Wt 305.0 lb

## 2023-08-15 DIAGNOSIS — R300 Dysuria: Secondary | ICD-10-CM | POA: Diagnosis not present

## 2023-08-15 DIAGNOSIS — R195 Other fecal abnormalities: Secondary | ICD-10-CM

## 2023-08-15 DIAGNOSIS — Z7689 Persons encountering health services in other specified circumstances: Secondary | ICD-10-CM

## 2023-08-15 DIAGNOSIS — N390 Urinary tract infection, site not specified: Secondary | ICD-10-CM

## 2023-08-15 DIAGNOSIS — R143 Flatulence: Secondary | ICD-10-CM

## 2023-08-15 DIAGNOSIS — R319 Hematuria, unspecified: Secondary | ICD-10-CM

## 2023-08-15 LAB — POC URINALSYSI DIPSTICK (AUTOMATED)
Bilirubin, UA: NEGATIVE
Glucose, UA: POSITIVE — AB
Ketones, UA: NEGATIVE
Nitrite, UA: NEGATIVE
Protein, UA: NEGATIVE
Spec Grav, UA: 1.015 (ref 1.010–1.025)
Urobilinogen, UA: NEGATIVE U/dL — AB
pH, UA: 5.5 (ref 5.0–8.0)

## 2023-08-15 MED ORDER — SULFAMETHOXAZOLE-TRIMETHOPRIM 400-80 MG PO TABS
1.0000 | ORAL_TABLET | Freq: Two times a day (BID) | ORAL | 0 refills | Status: AC
Start: 2023-08-15 — End: 2023-08-22

## 2023-08-15 NOTE — Patient Instructions (Addendum)
-  It was a pleasure to meet you and look forward to taking care of you. -Urinalysis shows signs of a urinary tract infection. Prescribed Bactrim 400/80mg , 1 tablet twice a day, 7 days. Will send urine for culture. Office will call with lab results and you will see them on MyChart. -Referral placed to GI for positive cologuard results back in 2022 and increase gas.  -Follow up in 6 months and needs Annual Wellness Visit with health coach-telehealth.

## 2023-08-15 NOTE — Progress Notes (Signed)
New Patient Office Visit  Subjective    Patient ID: [name], male    DOB: 1951-08-12  Age: 72 y.o. MRN: [id number]  CC:  Chief Complaint  Patient presents with   Establish Care    UTI,-started a month ago, sometimes feels a pinch, and has to push to empty his bladder.  flatulence- going on for a month, not friendly.     HPI [name] presents to establish care with new provider. Patient lives at [facility]. Some of his care is provided by providers in the facility and receiving wound care for his lower extremities within the facility.   Patients previous primary care provider: [facility] with Dr. Nicoletta Ba. Last office visit was 10/10/2022. Changing providers due to location.   Specialist: [facility] Rheumatology with Dr. Cristal Deer Rice-new appointment  [facility] [place] [facility] with Dr. Levora Dredge  [facility] with Gillian Shields, NP/ Dr. Zoila Shutter  [facility] [place] [facility] at [facility] with Dr. Richardean Sale  [facility] with Oletha Cruel Psychology; Carol Ada RD/Nutrition Professional, Dr. Venita Lick  [facility] [place] with Dr. Bjorn Pippin  [facility] with Dr. Lucy Chris Kidney with Dr. Crista Elliot   Patient is complaining of urinary symptoms. He reports it is hard to push his urine out, dysuria, increase frequency and urgency. Denies hematuria. He reports these symptoms have been occurring for about a month.   Also, complains of increase in flatulence that has been occurring for about a month.   Outpatient Encounter Medications as of 08/15/2023  Medication Sig   apixaban (ELIQUIS) 5 MG TABS tablet Take 5 mg by mouth 2 (two) times daily.   b complex vitamins capsule Take 1 capsule by mouth every  morning.   cetirizine (ZYRTEC) 10 MG tablet Take 10 mg by mouth at bedtime.   diltiazem (TIAZAC) 120 MG 24 hr capsule Take 1 capsule (120 mg total) by mouth daily.   docusate sodium (COLACE) 100 MG capsule Take 100 mg by mouth 2 (two) times daily.   Dulaglutide (TRULICITY) 3 MG/0.5ML SOPN Inject 3 mg as directed once a week.   ferrous sulfate 325 (65 FE) MG tablet Take 325 mg by mouth daily with breakfast.   glucose blood (ONETOUCH ULTRA) test strip CHECK BLOOD SUGAR TWICE  DAILY   HYDROcodone-acetaminophen (NORCO/VICODIN) 5-325 MG tablet Take 1-2 tablets by mouth every 6 (six) hours as needed for moderate pain. 1-2 tabs po bid prn pain   Insulin Lispro Prot & Lispro (HUMALOG 75/25 MIX) (75-25) 100 UNIT/ML Kwikpen Inject 38 Units into the skin 2 (two) times daily.   Insulin Pen Needle (B-D ULTRAFINE III SHORT PEN) 31G X 8 MM MISC USE 1 PENNEEDLE TWICE DAILY   metolazone (ZAROXOLYN) 5 MG tablet Take on Monday, Wednesday and Friday with Torsemide   miconazole (MICOTIN) 2 % powder Apply 1 Application topically in the morning and at bedtime. To abdominal fold.   Multiple Vitamin (MULTIVITAMIN) tablet Take 1 tablet by mouth daily.   potassium chloride SA (KLOR-CON M20) 20 MEQ tablet Take one tablet three times per week. (Patient taking differently: Take 20 mEq by mouth every Monday, Wednesday, and Friday. Take one tablet three times per week.)   sulfamethoxazole-trimethoprim (BACTRIM) 400-80 MG tablet Take 1 tablet by mouth 2 (two) times daily for 7 days.  terazosin (HYTRIN) 10 MG capsule Take 10 mg by mouth at bedtime.   testosterone cypionate (DEPOTESTOSTERONE CYPIONATE) 200 MG/ML injection Inject 0.5 mLs into the muscle once a week. On Friday.   traMADol (ULTRAM) 50 MG tablet Take 1 tablet (50 mg total) by mouth in the morning, at noon, and at bedtime.   febuxostat (ULORIC) 40 MG tablet Take 40 mg by mouth daily.   [DISCONTINUED] dapagliflozin propanediol (FARXIGA) 10 MG TABS tablet Take 1 tablet  (10 mg total) by mouth daily before breakfast. (Patient not taking: Reported on 08/15/2023)   [DISCONTINUED] lidocaine (LIDODERM) 5 % Place 1 patch onto the skin daily. Remove & Discard patch within 12 hours or as directed by MD, use as needed for pain relief. (Patient not taking: Reported on 08/15/2023)   [DISCONTINUED] Torsemide 40 MG TABS Take 40 mg by mouth 2 (two) times daily.   Facility-Administered Encounter Medications as of 08/15/2023  Medication   bupivacaine (MARCAINE) 0.5 % (with pres) injection 3 mL    Past Medical History:  Diagnosis Date   ALLERGIC RHINITIS 08/11/2006   Arthritis 2020   ASTHMA 08/11/2006   Bradycardia    Beta blocker d/c'd 2022   Chronic atrial fibrillation (HCC) 08/2014   Chronic combined systolic and diastolic CHF (congestive heart failure) [facility])    Cardiology f/u 12/2017: pt volume overloaded (R heart dysf suspected), BNP very high, lasix increased.  Repeat echo 12/2017: normal LV EF, mild DD, +RV syst dysfxn, mod pulm HTN, biatrial enlgmt.   Chronic constipation    Chronic renal insufficiency, stage 3 (moderate) (HCC)    Colon cancer screening 02/2021   02/2021 Cologuard POS->GI ref   DIABETES MELLITUS, TYPE II 08/11/2006   Heart murmur 1990   AFib   HYPERTENSION 08/11/2006   IDA (iron deficiency anemia)    04/2022.  Iron started.  Needs hemoccults   Impaired mobility and endurance    MRSA infection 06/2018   LL venous stasis ulcer infected   Normocytic anemia 2016-2019   03/2018 B12 normal, iron ok (ferritin borderline low).   OBESITY, MORBID 12/14/2007   saxenda started 05/2020 by [facility] wt mgmt center   OSA (obstructive sleep apnea)    to get sleep study with [facility]) as of 12/03/2018 consult.   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  92018 ID started him on amoxil prophylaxis---ineffective.  End 2018/Jan 2019 penicillin G injections prophyl helpful but pt  declined to continue this as of 01/2018 ID f/u.  ID talked him into resuming monthly penicillin G as of 02/2018 f/u.     Restless leg syndrome    Rx'd clonazepam 09/2017 and pt refused to take it after reading the medication's potential side effects.   Venous stasis ulcers of both lower extremities (HCC)    Severe lymphedema.  wound clinic care ongoing as of 01/2018    Past Surgical History:  Procedure Laterality Date   Carotid dopplers  07/23/2018   Left NORMAL.  Right 1-39% ICA stenosis, with <50% distal CCA stenosis (not hemodynamically significant)   LE venous dopplers Bilateral 03/06/2022   no dvt   RIGHT HEART CATH N/A 02/19/2022   Marked volume overload, R>>L HF, preserved CO. Procedure: RIGHT HEART CATH;  Surgeon: Dolores Patty, MD;  [facility] LAB;  Service: Cardiovascular;  Laterality: N/A;   TRANSTHORACIC ECHOCARDIOGRAM  11/2007; 09/2014; 11/2015;12/2017   LV fxn normal, EF normal, mild dilation of left atrium.  2015 grade II diast dysfxn.  2017 EF 55-60%. 2019: LVEF 60-65%, mild RV syst dysf,biatrial enlgmt, mod pulm htn. 02/14/22 EF 55-60%, unable to assess diast fxn, LVH, o/w normal   URETERAL STENT PLACEMENT     virtual colonoscopy  01/2011   Normal    Family History  Problem Relation Age of Onset   Diabetes Mother    Hearing loss Father    Dementia Sister    Pancreatic cancer Sister    Cancer Maternal Grandmother     Social History   Socioeconomic History   Marital status: Single    Spouse name: Not on file   Number of children: 1   Years of education: 14   Highest education level: Bachelor's degree (e.g., BA, AB, BS)  Occupational History   Occupation: retired  Tobacco Use   Smoking status: Never    Passive exposure: Never   Smokeless tobacco: Never  Vaping Use   Vaping status: Never Used  Substance and Sexual Activity   Alcohol use: Not Currently    Comment: Stopped late 70s   Drug use: Never   Sexual activity: Not Currently  Other  Topics Concern   Not on file  Social History Narrative         Social Determinants of Health   Financial Resource Strain: Low Risk  (10/06/2022)   Overall Financial Resource Strain (CARDIA)    Difficulty of Paying Living Expenses: Not hard at all  Food Insecurity: No Food Insecurity (06/26/2023)   Hunger Vital Sign    Worried About Running Out of Food in the Last Year: Never true    Ran Out of Food in the Last Year: Never true  Transportation Needs: No Transportation Needs (06/26/2023)   PRAPARE - Administrator, Civil Service (Medical): No    Lack of Transportation (Non-Medical): No  Physical Activity: Unknown (10/06/2022)   Exercise Vital Sign    Days of Exercise per Week: 0 days    Minutes of Exercise per Session: Not on file  Recent Concern: Physical Activity - Inactive (10/06/2022)   Exercise Vital Sign    Days of Exercise per Week: 0 days    Minutes of Exercise per Session: 0 min  Stress: No Stress Concern Present (10/06/2022)   [employer] of Occupational Health - Occupational Stress Questionnaire    Feeling of Stress : Only a little  Social Connections: Moderately Isolated (10/06/2022)   Social Connection and Isolation Panel [NHANES]    Frequency of Communication with Friends and Family: Once a week    Frequency of Social Gatherings with Friends and Family: Never    Attends Religious Services: More than 4 times per year    Active Member of [employer] or Organizations: Yes    Attends Banker Meetings: More than 4 times per year    Marital Status: Never married  Intimate Partner Violence: Not At Risk (06/26/2023)   Humiliation, Afraid, Rape, and Kick questionnaire    Fear of Current or Ex-Partner: No    Emotionally Abused: No    Physically Abused: No    Sexually Abused: No    ROS See HPI above    Objective   BP 110/64 (BP Location: Left Arm, Patient Position: Sitting, Cuff Size: Large)   Pulse (!) 54   Temp 97.6 F (36.4 C) (Oral)   Ht 5'  10" (1.778 m)   Wt (!) 305 lb (138.3 kg)   SpO2 94%   BMI 43.76 kg/m   Physical  Exam Vitals reviewed.  Constitutional:      General: He is not in acute distress.    Appearance: Normal appearance. He is obese. He is not ill-appearing, toxic-appearing or diaphoretic.     Comments: Wearing a hospital gown.   HENT:     Head: Normocephalic and atraumatic.  Eyes:     General:        Right eye: No discharge.        Left eye: No discharge.     Conjunctiva/sclera: Conjunctivae normal.  Cardiovascular:     Rate and Rhythm: Bradycardia present.  Pulmonary:     Effort: Pulmonary effort is normal. No respiratory distress.     Breath sounds: Normal breath sounds.  Musculoskeletal:        General: Normal range of motion.     Comments: Right velcro wrist splint   Skin:    General: Skin is warm and dry.     Comments: Bilateral lower extremities: Legs wrapped with dressing and ACE wrap, knees to his feet   Neurological:     General: No focal deficit present.     Mental Status: He is alert and oriented to person, place, and time. Mental status is at baseline.     Gait: Gait abnormal (Wheelchair).  Psychiatric:        Mood and Affect: Mood normal.        Behavior: Behavior normal.        Thought Content: Thought content normal.        Judgment: Judgment normal.     Latest Reference Range & Units 08/15/23 10:43  Bilirubin, UA  neg  Clarity, UA  cloudy  Color, UA  yellow  Glucose Negative  Positive !  Ketones, UA  neg  Leukocytes,UA Negative  Small (1+) !  Nitrite, UA  neg  pH, UA 5.0 - 8.0  5.5  Protein,UA Negative  Negative  Specific Gravity, UA 1.010 - 1.025  1.015  Urobilinogen, UA 0.2 or 1.0 E.U./dL negative !  RBC, UA  1+  !: Data is abnormal   Assessment & Plan:  Dysuria -     POCT Urinalysis Dipstick (Automated) -     Urine Culture  Colon cancer screening  Positive colorectal cancer screening using Cologuard test -     Ambulatory referral to  Gastroenterology  Flatulence -     Ambulatory referral to Gastroenterology  Urinary tract infection with hematuria, site unspecified -     Sulfamethoxazole-Trimethoprim; Take 1 tablet by mouth 2 (two) times daily for 7 days.  Dispense: 14 tablet; Refill: 0    1.Review health maintenance: -Cologuard: Positive on 02/26/2021, denies any follow up care with GI. Based on review of records, do not see where patient followed up with GI.  -Covid booster: He thinks he had a booster this year at the facility-[facility].  -Tdap: Recommend to obtain at local pharmacy -Influenza vaccine: He think he had a booster this year at the facility-[facility].  -Needs AWV visit with health coach as a telehealth visit.  -Ophthalmology exam: [facility] in December 2023, will request records -Zoster vaccine: Reports he has had. -Foot exam: unable to complete due to patients legs are wrapped.  2.Urinalysis completed today for urinary symptoms. Leukocytes present with symptoms. Will start treatment: Bactrim 400/80mg , 1 tablet twice a day, 7 days. Will send urine for culture and change antibiotic if necessary.  3.Referral placed to GI for positive cologuard results back in 2022 and increase flatulence.  4.  Patient is living in a facility, [facility], where he is receiving medical care by inpatient providers and has several specialist. Patient is unsure of whom is managing which medications. This provider feels uncomfortable with managing any chronic medications since he is in a facility, receiving care, and has several specialist. Will request records from facility and specialist that medical records can not be accessed within Epic.   Return in about 6 months (around 02/13/2024) for AWV-health couch telehealth , chronic management.   [name], NP

## 2023-08-16 LAB — URINE CULTURE
MICRO NUMBER:: 15584692
SPECIMEN QUALITY:: ADEQUATE

## 2023-08-18 NOTE — Progress Notes (Unsigned)
Office Visit Note  Patient: [name]             Date of Birth: Sep 07, 1951           MRN: [id number]             PCP: Charlynne Pander, MD Referring: Loleta Dicker, MD Visit Date: 08/19/2023 Occupation: @GUAROCC @  Subjective:  No chief complaint on file.   History of Present Illness: [name] is a 72 y.o. male ***     Activities of Daily Living:  Patient reports morning stiffness for *** {minute/hour:19697}.   Patient {ACTIONS;DENIES/REPORTS:21021675::"Denies"} nocturnal pain.  Difficulty dressing/grooming: {ACTIONS;DENIES/REPORTS:21021675::"Denies"} Difficulty climbing stairs: {ACTIONS;DENIES/REPORTS:21021675::"Denies"} Difficulty getting out of chair: {ACTIONS;DENIES/REPORTS:21021675::"Denies"} Difficulty using hands for taps, buttons, cutlery, and/or writing: {ACTIONS;DENIES/REPORTS:21021675::"Denies"}  No Rheumatology ROS completed.   PMFS History:  Patient Active Problem List   Diagnosis Date Noted   Acute renal failure superimposed on stage 3b chronic kidney disease, unspecified acute renal failure type (HCC) 06/26/2023   Hypokalemia 06/26/2023   Primary osteoarthritis of right knee 06/04/2022   Primary osteoarthritis of left knee 06/04/2022   Body mass index 45.0-49.9, adult (HCC) 06/04/2022   AKI (acute kidney injury) (HCC) 04/25/2022   Acute renal failure superimposed on stage 3b chronic kidney disease (HCC) 04/23/2022   Hyponatremia 04/23/2022   Pressure injury of skin 03/07/2022   Acute kidney injury superimposed on chronic kidney disease (HCC)    Obesity, Class III, BMI 40-49.9 (morbid obesity) (HCC)    Acute on chronic diastolic CHF (congestive heart failure) (HCC) 02/13/2022   Uncontrolled type 2 diabetes mellitus with hyperglycemia (HCC) 03/04/2018   Chronic diastolic CHF (congestive heart failure) (HCC) 12/24/2016   Lymphedema 02/22/2016   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   Chronic headaches 05/10/2015    Equivocal stress test 10/24/2014   Persistent atrial fibrillation (HCC) 08/15/2014   Healthcare maintenance 08/15/2014   Type 2 diabetes mellitus with diabetic neuropathy (HCC) 08/15/2014   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   Recurrent cellulitis of lower extremity 08/02/2013   Lymphedema of both lower extremities 11/04/2008   Morbid obesity due to excess calories (HCC) 12/14/2007   Venous (peripheral) insufficiency 07/22/2007   Uncontrolled type 2 diabetes mellitus with hyperglycemia, with long-term current use of insulin (HCC) 08/11/2006   Hypertension, essential 08/11/2006   ALLERGIC RHINITIS 08/11/2006   ASTHMA 08/11/2006   Asthma 08/11/2006    Past Medical History:  Diagnosis Date   ALLERGIC RHINITIS 08/11/2006   Arthritis 2020   ASTHMA 08/11/2006   Bradycardia    Beta blocker d/c'd 2022   Chronic atrial fibrillation (HCC) 08/2014   Chronic combined systolic and diastolic CHF (congestive heart failure) [facility])    Cardiology f/u 12/2017: pt volume overloaded (R heart dysf suspected), BNP very high, lasix increased.  Repeat echo 12/2017: normal LV EF, mild DD, +RV syst dysfxn, mod pulm HTN, biatrial enlgmt.   Chronic constipation    Chronic renal insufficiency, stage 3 (moderate) (HCC)    Colon cancer screening 02/2021   02/2021 Cologuard POS->GI ref   DIABETES MELLITUS, TYPE II 08/11/2006   Heart murmur 1990   AFib   HYPERTENSION 08/11/2006   IDA (iron deficiency anemia)    04/2022.  Iron started.  Needs hemoccults   Impaired mobility and endurance    MRSA infection 06/2018   LL venous stasis ulcer infected   Normocytic anemia 2016-2019   03/2018 B12 normal, iron ok (ferritin borderline low).   OBESITY, MORBID 12/14/2007   saxenda started 05/2020 by  [facility] wt mgmt center   OSA (obstructive sleep apnea)    to get sleep study with [facility]) as of 12/03/2018 consult.   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  92018 ID started him on amoxil prophylaxis---ineffective.  End 2018/Jan 2019 penicillin G injections prophyl helpful but pt declined to continue this as of 01/2018 ID f/u.  ID talked him into resuming monthly penicillin G as of 02/2018 f/u.     Restless leg syndrome    Rx'd clonazepam 09/2017 and pt refused to take it after reading the medication's potential side effects.   Venous stasis ulcers of both lower extremities (HCC)    Severe lymphedema.  wound clinic care ongoing as of 01/2018    Family History  Problem Relation Age of Onset   Diabetes Mother    Hearing loss Father    Dementia Sister    Pancreatic cancer Sister    Cancer Maternal Grandmother    Past Surgical History:  Procedure Laterality Date   Carotid dopplers  07/23/2018   Left NORMAL.  Right 1-39% ICA stenosis, with <50% distal CCA stenosis (not hemodynamically significant)   LE venous dopplers Bilateral 03/06/2022   no dvt   RIGHT HEART CATH N/A 02/19/2022   Marked volume overload, R>>L HF, preserved CO. Procedure: RIGHT HEART CATH;  Surgeon: Dolores Patty, MD;  Location: [facility] INVASIVE CV LAB;  Service: Cardiovascular;  Laterality: N/A;   TRANSTHORACIC ECHOCARDIOGRAM  11/2007; 09/2014; 11/2015;12/2017   LV fxn normal, EF normal, mild dilation of left atrium.  2015 grade II diast dysfxn.  2017 EF 55-60%. 2019: LVEF 60-65%, mild RV syst dysf,biatrial enlgmt, mod pulm htn. 02/14/22 EF 55-60%, unable to assess diast fxn, LVH, o/w normal   URETERAL STENT PLACEMENT     virtual colonoscopy  01/2011   Normal   Social History   Social History Narrative         Immunization History  Administered Date(s) Administered   Fluad Quad(high Dose 65+) 07/06/2019, 07/12/2020, 07/11/2021, 07/10/2022   Influenza Split 08/19/2011, 08/11/2012   Influenza Whole 08/11/2006, 08/17/2008, 08/08/2009, 09/17/2010   Influenza, High Dose Seasonal PF 08/05/2016, 09/08/2018   Influenza,inj,Quad PF,6+ Mos 07/26/2013, 09/11/2015,  09/15/2017   PFIZER(Purple Top)SARS-COV-2 Vaccination 12/14/2019, 01/10/2020, 09/15/2020   Pneumococcal Conjugate-13 08/05/2016   Pneumococcal Polysaccharide-23 04/22/2011, 12/08/2017   Td 04/10/2009   Zoster, Live 04/22/2011     Objective: Vital Signs: There were no vitals taken for this visit.   Physical Exam   Musculoskeletal Exam: ***  CDAI Exam: CDAI Score: -- Patient Global: --; Provider Global: -- Swollen: --; Tender: -- Joint Exam 08/19/2023   No joint exam has been documented for this visit   There is currently no information documented on the homunculus. Go to the Rheumatology activity and complete the homunculus joint exam.  Investigation: No additional findings.  Imaging: No results found.  Recent Labs: Lab Results  Component Value Date   WBC 9.9 06/26/2023   HGB 16.5 06/26/2023   PLT 259 06/26/2023   NA 132 (L) 06/30/2023   K 3.8 06/30/2023   CL 94 (L) 06/30/2023   CO2 31 06/30/2023   GLUCOSE 179 (H) 06/30/2023   BUN 43 (H) 06/30/2023   CREATININE 1.82 (H) 06/30/2023   BILITOT 0.6 12/23/2022   ALKPHOS 119 12/23/2022   AST 20 12/23/2022   ALT 11 12/23/2022   PROT 6.4 12/23/2022   ALBUMIN 2.9 (L) 06/27/2023   CALCIUM 8.2 (  L) 06/30/2023   GFRAA 63 02/09/2020    Speciality Comments: No specialty comments available.  Procedures:  No procedures performed Allergies: Other and Hydralazine   Assessment / Plan:     Visit Diagnoses: No diagnosis found.  Orders: No orders of the defined types were placed in this encounter.  No orders of the defined types were placed in this encounter.   Face-to-face time spent with patient was *** minutes. Greater than 50% of time was spent in counseling and coordination of care.  Follow-Up Instructions: No follow-ups on file.   Fuller Plan, MD  Note - This record has been created using [employer].  Chart creation errors have been sought, but may not always  have been located. Such creation  errors do not reflect on  the standard of medical care.

## 2023-08-19 ENCOUNTER — Ambulatory Visit: Payer: 59 | Attending: Internal Medicine | Admitting: Internal Medicine

## 2023-08-19 ENCOUNTER — Encounter: Payer: Self-pay | Admitting: Internal Medicine

## 2023-08-19 VITALS — BP 128/77 | HR 70 | Resp 14 | Ht 70.0 in | Wt 299.0 lb

## 2023-08-19 DIAGNOSIS — M25531 Pain in right wrist: Secondary | ICD-10-CM | POA: Diagnosis not present

## 2023-08-19 DIAGNOSIS — M1A39X0 Chronic gout due to renal impairment, multiple sites, without tophus (tophi): Secondary | ICD-10-CM | POA: Diagnosis not present

## 2023-08-19 DIAGNOSIS — B351 Tinea unguium: Secondary | ICD-10-CM | POA: Insufficient documentation

## 2023-08-19 DIAGNOSIS — M1A30X0 Chronic gout due to renal impairment, unspecified site, without tophus (tophi): Secondary | ICD-10-CM | POA: Insufficient documentation

## 2023-08-19 MED ORDER — FEBUXOSTAT 80 MG PO TABS
80.0000 mg | ORAL_TABLET | Freq: Every day | ORAL | Status: AC
Start: 2023-08-19 — End: ?

## 2023-08-19 NOTE — Patient Instructions (Signed)
Information for patients with Gout  Gout defined-Gout occurs when urate crystals accumulate in your joint causing the inflammation and intense pain of gout attack.  Urate crystals can form when you have high levels of uric acid in your blood.  Your body produces uric acid when it breaks down prurines-substances that are found naturally in your body, as well as in certain foods such as organ meats, anchioves, herring, asparagus, and mushrooms.  Normally uric acid dissolves in your blood and passes through your kidneys into your urine.  But sometimes your body either produces too much uric acid or your kidneys excrete too little uric acid.  When this happens, uric acid can build up, forming sharp needle-like urate crystals in a joint or surrounding tissue that cause pain, inflammation and swelling.    Gout is characterized by sudden, severe attacks of pain, redness and tenderness in joints, often the joint at the base of the big toe.  Gout is complex form of arthritis that can affect anyone.  Men are more likely to get gout but women become increasingly more susceptible to gout after menopause.  An acute attack of gout can wake you up in the middle of the night with the sensation that your big toe is on fire.  The affected joint is hot, swollen and so tender that even the weight or the sheet on it may seem intolerable.  If you experience symptoms of an acute gout attack it is important to your doctor as soon as the symptoms start.  Gout that goes untreated can lead to worsening pain and joint damage.  Risk Factors:  You are more likely to develop gout if you have high levels of uric acid in your body.    Factors that increase the uric acid level in your body include:  Lifestyle factors.  Excessive alcohol use-generally more than two drinks a day for men and more than one for women increase the risk of gout.  Medical conditions.  Certain conditions make it more likely that you will develop gout.   These include hypertension, and chronic conditions such as diabetes, high levels of fat and cholesterol in the blood, and narrowing of the arteries.  Certain medications.  The uses of Thiazide diuretics- commonly used to treat hypertension and low dose aspirin can also increase uric acid levels.  Family history of gout.  If other members of your family have had gout, you are more likely to develop the disease.  Age and sex. Gout occurs more often in men than it does in women, primarily because women tend to have lower uric acid levels than men do.  Men are more likely to develop gout earlier usually between the ages of 40-50- whereas women generally develop signs and symptoms after menopause.    Tests and diagnosis:  Tests to help diagnose gout may include:  Blood test.  Your doctor may recommend a blood test to measure the uric acid level in your blood .  Blood tests can be misleading, though.  Some people have high uric acid levels but never experience gout.  And some people have signs and symptoms of gout, but don't have unusual levels of uric acid in their blood.  Joint fluid test.  Your doctor may use a needle to draw fluid from your affected joint.  When examined under the microscope, your joint fluid may reveal urate crystals.  Treatment:  Treatment for gout usually involves medications.  What medications you and your doctor choose will be   based on your current health and other medications you currently take.  Gout medications can be used to treat acute gout attacks and prevent future attacks as well as reduce your risk of complications from gout such as the development of tophi from urate crystal deposits.  Alternative medicine:   Certain foods have been studied for their potential to lower uric acid levels, including:  Coffee.  Studies have found an association between coffee drinking (regular and decaf) and lower uric acid levels.  The evidence is not enough to encourage  non-coffee drinkers to start, but it may give clues to new ways of treating gout in the future.  Vitamin C.  Supplements containing vitamin C may reduce the levels of uric acid in your blood.  However, vitamin as a treatment for gout. Don't assume that if a little vitamin C is good, than lots is better.  Megadoses of vitamin C may increase your bodies uric acid levels.  Cherries.  Cherries have been associated with lower levels of uric acid in studies, but it isn't clear if they have any effect on gout signs and symptoms.  Eating more cherries and other dar-colored fruits, such as blackberries, blueberries, purple grapes and raspberries, may be a safe way to support your gout treatment.    Lifestyle/Diet Recommendations:   Drink 8 to 16 cups ( about 2 to 4 liters) of fluid each day, with at least half being water.  Avoid alcohol  Eat a moderate amount of protein, preferably from healthy sources, such as low-fat or fat-free dairy, tofu, eggs, and nut butters.  Limit you daily intake of meat, fish, and poultry to 4 to 6 ounces.  Avoid high fat meats and desserts.  Decrease you intake of shellfish, beef, lamb, pork, eggs and cheese.  Choose a good source of vitamin C daily such as citrus fruits, strawberries, broccoli,  brussel sprouts, papaya, and cantaloupe.   Choose a good source of vitamin A every other day such as yellow fruits, or dark green/yellow vegetables.  Avoid drastic weigh reduction or fasting.  If weigh loss is desired lose it over a period of several months.  See "dietary considerations.." chart for specific food recommendations.  Dietary Considerations for people with Gout  Food with negligible purine content (0-15 mg of purine nitrogen per 100 grams food)  May use as desired except on calorie variations  Non fat milk Cocoa Cereals (except in list II) Hard candies  Buttermilk Carbonated drinks Vegetables (except in list II) Sherbet  Coffee Fruits Sugar Honey  Tea  Cottage Cheese Gelatin-jell-o Salt  Fruit juice Breads Angel food Cake   Herbs/spices Jams/Jellies Carnation Instant Breakfast    Foods that do not contain excessive purine content, but must be limited due to fat content  Cream Eggs Oil and Salad Dressing  Half and Half Peanut Butter Chocolate  Whole Milk Cakes Potato Chips  Butter Ice Cream Fried Foods  Cheese Nuts Waffles, pancakes   List II: Food with moderate purine content (50-150 mg of purine nitrogen per 100 grams of food)  Limit total amount each day to 5 oz. cooked Lean meat, other than those on list III   Poultry, other than those on list III Fish, other than those on list III   Seafood, other than those on list III  These foods may be used occasionally  Peas Lentils Bran  Spinach Oatmeal Dried Beans and Peas  Asparagus Wheat Germ Mushrooms   Additional information about meat choices  Choose fish   and poultry, particularly without skin, often.  Select lean, well trimmed cuts of meat.  Avoid all fatty meats, bacon , sausage, fried meats, fried fish, or poultry, luncheon meats, cold cuts, hot dogs, meats canned or frozen in gravy, spareribs and frozen and packaged prepared meats.   List III: Foods with HIGH purine content / Foods to AVOID (150-800 mg of purine nitrogen per 100 grams of food)  Anchovies Herring Meat Broths  Liver Mackerel Meat Extracts  Kidney Scallops Meat Drippings  Sardines Wild Game Mincemeat  Sweetbreads Goose Gravy  Heart Tongue Yeast, baker's and brewers   Commercial soups made with any of the foods listed in List II or List III  In addition avoid all alcoholic beverages 

## 2023-08-20 ENCOUNTER — Encounter: Payer: Self-pay | Admitting: Physician Assistant

## 2023-08-20 LAB — URIC ACID: Uric Acid, Serum: 8.2 mg/dL — ABNORMAL HIGH (ref 4.0–8.0)

## 2023-08-20 NOTE — Progress Notes (Signed)
Uric acid was 8.2 this is still above the goal of 6.0.This is a lot better than the 10.2 and 13.7 levels he had before so I think the uloric is working. We can see if increasing the dose improves this as expected.

## 2023-09-01 ENCOUNTER — Encounter (INDEPENDENT_AMBULATORY_CARE_PROVIDER_SITE_OTHER): Payer: Self-pay | Admitting: Vascular Surgery

## 2023-09-01 ENCOUNTER — Ambulatory Visit (INDEPENDENT_AMBULATORY_CARE_PROVIDER_SITE_OTHER): Payer: 59 | Admitting: Vascular Surgery

## 2023-09-01 VITALS — BP 121/71 | HR 53 | Resp 16 | Wt 303.0 lb

## 2023-09-01 DIAGNOSIS — J45909 Unspecified asthma, uncomplicated: Secondary | ICD-10-CM

## 2023-09-01 DIAGNOSIS — I10 Essential (primary) hypertension: Secondary | ICD-10-CM

## 2023-09-01 DIAGNOSIS — I872 Venous insufficiency (chronic) (peripheral): Secondary | ICD-10-CM | POA: Diagnosis not present

## 2023-09-01 DIAGNOSIS — I890 Lymphedema, not elsewhere classified: Secondary | ICD-10-CM | POA: Diagnosis not present

## 2023-09-01 DIAGNOSIS — E1140 Type 2 diabetes mellitus with diabetic neuropathy, unspecified: Secondary | ICD-10-CM

## 2023-09-01 DIAGNOSIS — Z794 Long term (current) use of insulin: Secondary | ICD-10-CM

## 2023-09-07 ENCOUNTER — Encounter (INDEPENDENT_AMBULATORY_CARE_PROVIDER_SITE_OTHER): Payer: Self-pay | Admitting: Vascular Surgery

## 2023-09-17 ENCOUNTER — Encounter (HOSPITAL_BASED_OUTPATIENT_CLINIC_OR_DEPARTMENT_OTHER): Payer: Self-pay | Admitting: Family Medicine

## 2023-09-17 ENCOUNTER — Other Ambulatory Visit (HOSPITAL_BASED_OUTPATIENT_CLINIC_OR_DEPARTMENT_OTHER): Payer: Self-pay | Admitting: Family Medicine

## 2023-09-17 ENCOUNTER — Ambulatory Visit (INDEPENDENT_AMBULATORY_CARE_PROVIDER_SITE_OTHER): Payer: 59 | Admitting: Family Medicine

## 2023-09-17 ENCOUNTER — Encounter (HOSPITAL_BASED_OUTPATIENT_CLINIC_OR_DEPARTMENT_OTHER): Payer: Self-pay | Admitting: *Deleted

## 2023-09-17 VITALS — BP 122/78 | HR 78 | Ht 70.0 in | Wt 311.4 lb

## 2023-09-17 DIAGNOSIS — Z794 Long term (current) use of insulin: Secondary | ICD-10-CM

## 2023-09-17 DIAGNOSIS — R350 Frequency of micturition: Secondary | ICD-10-CM | POA: Insufficient documentation

## 2023-09-17 DIAGNOSIS — E1165 Type 2 diabetes mellitus with hyperglycemia: Secondary | ICD-10-CM | POA: Diagnosis not present

## 2023-09-17 NOTE — Assessment & Plan Note (Addendum)
As outlined above, patient with increased frequency, difficulty with urination, urinary pressure.  Given prior testing, I do feel that symptoms are less likely related to UTI.  Patient did attempt to provide urine sample in office today, but had difficulty with this. Recommendation is for patient to follow-up with his urologist to determine if further testing is warranted.  He may require catheter in order to collect urine sample to exclude UTI.  He does indicate that he will contact his urology office in order to schedule appointment with them.

## 2023-09-17 NOTE — Assessment & Plan Note (Signed)
Recent hemoglobin A1c not at goal.  Unclear who has been primarily managing diabetes medication, patient also not certain in this regard.  Given complex medical history, adequately controlled blood sugars, feel that further evaluation and recommendations from endocrinology would be beneficial.  Patient in agreement with referral, referral placed today.

## 2023-09-17 NOTE — Progress Notes (Signed)
New Patient Office Visit  Subjective    Patient ID: [name], male    DOB: 03-Feb-1951  Age: 72 y.o. MRN: [id number]  CC:  Chief Complaint  Patient presents with   New Patient (Initial Visit)    New patient pt is not able to empty bladder has frequent urination feels pressure has been going on for about 1 week     HPI [name] presents to establish care Last PCP - [name], last seen about 1 month ago, this was his initial establishing visit with her  Patient with complex medical history, does have a number of specialist that he does see as listed below.  He also has provider at facility where he is living currently, [name].  Primary concern today is related to urinary issues.  DM: Current medications include Trulicity, 75/25 insulin.  Patient is somewhat uncertain regards to who is managing medications.  On review of chart, it does appear that Trulicity was most recently prescribed by his cardiologist.  He indicates the 1 point he was being managed on sliding scale insulin where his number of units administered would vary depending on blood sugar.  However, he indicates that his provider at current living facility does not believe in utilizing sliding scale and thus administers the same dose of insulin regardless of blood sugar.  Recent hemoglobin A1c has been above goal at 8.4% and prior to this 9.5%.  Urinary issues: Patient reports that he has been having trouble with completely emptying his bladder, frequent urination.  Has had "pressure" when attempting to urinate.  Will feel a pinching sensation at the beginning/end of urinating, no dysuria reported however.  He does have a urologist that he follows with, last seen about 3 months ago.  He additionally mentioned similar symptoms to prior PCP about 1 month ago.  Did have urine testing completed as well as urine culture.  Did initially have treatment with Bactrim, however urine culture ultimately  showed mixed flora.  On review of urology note, patient does have phimosis which will result in urine samples typically being contaminated for patient and thus will make it difficult to truly assess for UTI.  He has not had any new symptoms such as fever, chills, sweats.  Specialists:  [facility] with Dr. Sheliah Hatch [facility] with Dr. Levora Dredge  [facility] with Gillian Shields, NP/ Dr. Zoila Shutter  [facility] [place] [facility] at [facility] with Dr. Richardean Sale  [facility] with Oletha Cruel Psychology; Carol Ada RD/Nutrition Professional, Dr. Venita Lick  [facility] [place] with Dr. Bjorn Pippin  [facility] with Dr. Lucy Chris Kidney with Dr. Crista Elliot   Patient found to have abnormal Cologuard test in the past.  Prior PCP did place referral to GI 1 month ago and he does have this appointment scheduled in about 2 months.  Patient is originally from [name] living in [name]   Outpatient Encounter Medications as of 09/17/2023  Medication Sig   apixaban (ELIQUIS) 5 MG TABS tablet Take 5 mg by mouth 2 (two) times daily.   b complex vitamins capsule Take 1 capsule by mouth every morning.   cetirizine (ZYRTEC) 10 MG tablet Take 10 mg by mouth at bedtime.   Cholecalciferol (D-3-5) 125 MCG (5000 UT) capsule Take 5,000 Units by mouth  daily.   diltiazem (TIAZAC) 120 MG 24 hr capsule Take 1 capsule (120 mg total) by mouth daily.   docusate sodium (COLACE) 100 MG capsule Take 100 mg by mouth 2 (two) times daily.   Dulaglutide (TRULICITY) 3 MG/0.5ML SOPN Inject 3 mg as directed once a week.   Febuxostat 80 MG TABS Take 1 tablet (80 mg total) by mouth daily.   ferrous sulfate 325 (65 FE) MG tablet Take 325 mg by mouth daily with  breakfast.   glucose blood (ONETOUCH ULTRA) test strip CHECK BLOOD SUGAR TWICE  DAILY   HYDROcodone-acetaminophen (NORCO/VICODIN) 5-325 MG tablet Take 1-2 tablets by mouth every 6 (six) hours as needed for moderate pain. 1-2 tabs po bid prn pain   Insulin Lispro Prot & Lispro (HUMALOG 75/25 MIX) (75-25) 100 UNIT/ML Kwikpen Inject 42 Units into the skin 2 (two) times daily.   Insulin Pen Needle (B-D ULTRAFINE III SHORT PEN) 31G X 8 MM MISC USE 1 PENNEEDLE TWICE DAILY   miconazole (MICOTIN) 2 % powder Apply 1 Application topically in the morning and at bedtime. To abdominal fold.   Multiple Vitamin (MULTIVITAMIN) tablet Take 1 tablet by mouth daily.   potassium chloride SA (KLOR-CON M20) 20 MEQ tablet Take one tablet three times per week. (Patient taking differently: Take 20 mEq by mouth every Monday, Wednesday, and Friday. Take one tablet three times per week.)   terazosin (HYTRIN) 10 MG capsule Take 10 mg by mouth at bedtime.   testosterone cypionate (DEPOTESTOSTERONE CYPIONATE) 200 MG/ML injection Inject 0.5 mLs into the muscle once a week. On Friday.   torsemide (DEMADEX) 20 MG tablet Take 40 mg by mouth 2 (two) times daily.   traMADol (ULTRAM) 50 MG tablet Take 1 tablet (50 mg total) by mouth in the morning, at noon, and at bedtime.   [DISCONTINUED] metolazone (ZAROXOLYN) 5 MG tablet Take on Monday, Wednesday and Friday with Torsemide   Facility-Administered Encounter Medications as of 09/17/2023  Medication   bupivacaine (MARCAINE) 0.5 % (with pres) injection 3 mL    Past Medical History:  Diagnosis Date   ALLERGIC RHINITIS 08/11/2006   Arthritis 2020   ASTHMA 08/11/2006   Bradycardia    Beta blocker d/c'd 2022   Chronic atrial fibrillation (HCC) 08/2014   Chronic combined systolic and diastolic CHF (congestive heart failure) [facility])    Cardiology f/u 12/2017: pt volume overloaded (R heart dysf suspected), BNP very high, lasix increased.  Repeat echo 12/2017: normal LV EF, mild DD, +RV  syst dysfxn, mod pulm HTN, biatrial enlgmt.   Chronic constipation    Chronic renal insufficiency, stage 3 (moderate) (HCC)    Colon cancer screening 02/2021   02/2021 Cologuard POS->GI ref   DIABETES MELLITUS, TYPE II 08/11/2006   Heart murmur 1990   AFib   HYPERTENSION 08/11/2006   IDA (iron deficiency anemia)    04/2022.  Iron started.  Needs hemoccults   Impaired mobility and endurance    MRSA infection 06/2018   LL venous stasis ulcer infected   Normocytic anemia 2016-2019   03/2018 B12 normal, iron ok (ferritin borderline low).   OBESITY, MORBID 12/14/2007   saxenda started 05/2020 by [facility] wt mgmt center   OSA (obstructive sleep apnea)    to get sleep study with [facility]) as of 12/03/2018 consult.   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  [id number] ID started him on amoxil prophylaxis---ineffective.  End 2018/Jan 2019 penicillin G injections  prophyl helpful but pt declined to continue this as of 01/2018 ID f/u.  ID talked him into resuming monthly penicillin G as of 02/2018 f/u.     Restless leg syndrome    Rx'd clonazepam 09/2017 and pt refused to take it after reading the medication's potential side effects.   Venous stasis ulcers of both lower extremities (HCC)    Severe lymphedema.  wound clinic care ongoing as of 01/2018    Past Surgical History:  Procedure Laterality Date   Carotid dopplers  07/23/2018   Left NORMAL.  Right 1-39% ICA stenosis, with <50% distal CCA stenosis (not hemodynamically significant)   LE venous dopplers Bilateral 03/06/2022   no dvt   RIGHT HEART CATH N/A 02/19/2022   Marked volume overload, R>>L HF, preserved CO. Procedure: RIGHT HEART CATH;  Surgeon: Dolores Patty, MD;  Location: [facility] INVASIVE CV LAB;  Service: Cardiovascular;  Laterality: N/A;   TRANSTHORACIC ECHOCARDIOGRAM  11/2007; 09/2014; 11/2015;12/2017   LV fxn normal, EF normal, mild dilation of left atrium.   2015 grade II diast dysfxn.  2017 EF 55-60%. 2019: LVEF 60-65%, mild RV syst dysf,biatrial enlgmt, mod pulm htn. 02/14/22 EF 55-60%, unable to assess diast fxn, LVH, o/w normal   URETERAL STENT PLACEMENT     virtual colonoscopy  01/2011   Normal    Family History  Problem Relation Age of Onset   Diabetes Mother    Hearing loss Father    Dementia Sister    Pancreatic cancer Sister    Cancer Maternal Grandmother     Social History   Socioeconomic History   Marital status: Single    Spouse name: Not on file   Number of children: 1   Years of education: 14   Highest education level: Bachelor's degree (e.g., BA, AB, BS)  Occupational History   Occupation: retired  Tobacco Use   Smoking status: Never    Passive exposure: Never   Smokeless tobacco: Never  Vaping Use   Vaping status: Never Used  Substance and Sexual Activity   Alcohol use: Not Currently    Comment: Stopped late 70s   Drug use: Never   Sexual activity: Not Currently  Other Topics Concern   Not on file  Social History Narrative         Social Determinants of Health   Financial Resource Strain: Low Risk  (10/06/2022)   Overall Financial Resource Strain (CARDIA)    Difficulty of Paying Living Expenses: Not hard at all  Food Insecurity: No Food Insecurity (09/13/2023)   Hunger Vital Sign    Worried About Running Out of Food in the Last Year: Never true    Ran Out of Food in the Last Year: Never true  Transportation Needs: Unmet Transportation Needs (09/13/2023)   PRAPARE - Transportation    Lack of Transportation (Medical): No    Lack of Transportation (Non-Medical): Yes  Physical Activity: Unknown (09/13/2023)   Exercise Vital Sign    Days of Exercise per Week: 0 days    Minutes of Exercise per Session: Not on file  Stress: No Stress Concern Present (09/13/2023)   [employer] of Occupational Health - Occupational Stress Questionnaire    Feeling of Stress : Only a little  Social Connections:  Moderately Integrated (09/13/2023)   Social Connection and Isolation Panel [NHANES]    Frequency of Communication with Friends and Family: More than three times a week    Frequency of Social Gatherings with Friends and Family: More than three times  a week    Attends Religious Services: More than 4 times per year    Active Member of Clubs or Organizations: Yes    Attends Banker Meetings: 1 to 4 times per year    Marital Status: Never married  Intimate Partner Violence: Not At Risk (06/26/2023)   Humiliation, Afraid, Rape, and Kick questionnaire    Fear of Current or Ex-Partner: No    Emotionally Abused: No    Physically Abused: No    Sexually Abused: No    Objective    BP 122/78 (BP Location: Right Arm, Patient Position: Sitting, Cuff Size: Normal)   Pulse 78   Ht 5\' 10"  (1.778 m)   Wt (!) 311 lb 6.4 oz (141.3 kg)   SpO2 98%   PF 311 L/min   BMI 44.68 kg/m   Physical Exam  72 year old male in no acute distress, vital signs stable Cardiovascular exam with irregularly irregular rhythm, normal rate Lungs clear to auscultation bilaterally  Assessment & Plan:   Uncontrolled type 2 diabetes mellitus with hyperglycemia, with long-term current use of insulin (HCC) Assessment & Plan: Recent hemoglobin A1c not at goal.  Unclear who has been primarily managing diabetes medication, patient also not certain in this regard.  Given complex medical history, adequately controlled blood sugars, feel that further evaluation and recommendations from endocrinology would be beneficial.  Patient in agreement with referral, referral placed today.   Urinary frequency Assessment & Plan: As outlined above, patient with increased frequency, difficulty with urination, urinary pressure.  Given prior testing, I do feel that symptoms are less likely related to UTI.  Patient did attempt to provide urine sample in office today, but had difficulty with this. Recommendation is for patient to  follow-up with his urologist to determine if further testing is warranted.  He may require catheter in order to collect urine sample to exclude UTI.  He does indicate that he will contact his urology office in order to schedule appointment with them.   Return in about 4 months (around 01/15/2024).   Spent 51 minutes on this patient encounter, including preparation, chart review, face-to-face counseling with patient and coordination of care, and documentation of encounter   ___________________________________________ Yago Ludvigsen de [place], MD, ABFM, [facility] and Sports Medicine [facility]

## 2023-09-17 NOTE — Addendum Note (Signed)
Addended by: Ishmael Holter R on: 09/17/2023 12:01 PM   Modules accepted: Orders

## 2023-09-18 NOTE — Progress Notes (Signed)
Aleen Sells D.[facility] [address] [place] [address] Phone: [phone]   Assessment and Plan:     1. Chronic right shoulder pain 2. Primary osteoarthritis of right shoulder - Chronic with exacerbation, subsequent sports medicine visit - Overall improvement in right shoulder pain with patient doing light exercises at home and intermittent Tylenol use - Patient was not able to start physical therapy at this facility for shoulder.  He states that they would only do physical therapy on 1 joint at a time and he is currently having physical therapy for his knee pain.  Repeat external referral for PT provided - Start HEP for shoulder.  Handout provided - Continue Tylenol use as needed for day-to-day pain relief  Pertinent previous records reviewed include none  Follow Up: 6 to 8 weeks for reevaluation.  If no improvement or worsening of symptoms, could consider subacromial CSI   Subjective:   I, [name], am serving as a Neurosurgeon for Doctor Richardean Sale   Chief Complaint: right shoulder pain    HPI:    08/07/2023 Patient is a 72 year old male complaining of right shoulder pain. Patient states that he has decreased ROM . Decreased ROM  for about 2 weeks. No MOI. No numbness or tingling.   09/19/2023 Patient states that his pain is the same the facility did no therapy for him   Relevant Historical Information: History of atrial fibrillation on chronic anticoagulation with Eliquis, elevated BMI, DM type II, CKD  Additional pertinent review of systems negative.   Current Outpatient Medications:    apixaban (ELIQUIS) 5 MG TABS tablet, Take 5 mg by mouth 2 (two) times daily., Disp: , Rfl:    b complex vitamins capsule, Take 1 capsule by mouth every morning., Disp: , Rfl:    cetirizine (ZYRTEC) 10 MG tablet, Take 10 mg by mouth at bedtime., Disp: , Rfl:    Cholecalciferol (D-3-5) 125 MCG (5000 UT) capsule, Take 5,000 Units by  mouth daily., Disp: , Rfl:    diltiazem (TIAZAC) 120 MG 24 hr capsule, Take 1 capsule (120 mg total) by mouth daily., Disp: 90 capsule, Rfl: 3   docusate sodium (COLACE) 100 MG capsule, Take 100 mg by mouth 2 (two) times daily., Disp: , Rfl:    Dulaglutide (TRULICITY) 3 MG/0.5ML SOPN, Inject 3 mg as directed once a week., Disp: 6 mL, Rfl: 3   Febuxostat 80 MG TABS, Take 1 tablet (80 mg total) by mouth daily., Disp: , Rfl:    ferrous sulfate 325 (65 FE) MG tablet, Take 325 mg by mouth daily with breakfast., Disp: , Rfl:    glucose blood (ONETOUCH ULTRA) test strip, CHECK BLOOD SUGAR TWICE  DAILY, Disp: 200 strip, Rfl: 0   HYDROcodone-acetaminophen (NORCO/VICODIN) 5-325 MG tablet, Take 1-2 tablets by mouth every 6 (six) hours as needed for moderate pain. 1-2 tabs po bid prn pain, Disp: 5 tablet, Rfl: 0   Insulin Lispro Prot & Lispro (HUMALOG 75/25 MIX) (75-25) 100 UNIT/ML Kwikpen, Inject 42 Units into the skin 2 (two) times daily., Disp: , Rfl:    Insulin Pen Needle (B-D ULTRAFINE III SHORT PEN) 31G X 8 MM MISC, USE 1 PENNEEDLE TWICE DAILY, Disp: 200 each, Rfl: 1   miconazole (MICOTIN) 2 % powder, Apply 1 Application topically in the morning and at bedtime. To abdominal fold., Disp: , Rfl:    Multiple Vitamin (MULTIVITAMIN) tablet, Take 1 tablet by mouth daily., Disp: 30 tablet, Rfl: 0  potassium chloride SA (KLOR-CON M20) 20 MEQ tablet, Take one tablet three times per week. (Patient taking differently: Take 20 mEq by mouth every Monday, Wednesday, and Friday. Take one tablet three times per week.), Disp: 45 tablet, Rfl: 2   terazosin (HYTRIN) 10 MG capsule, Take 10 mg by mouth at bedtime., Disp: , Rfl:    testosterone cypionate (DEPOTESTOSTERONE CYPIONATE) 200 MG/ML injection, Inject 0.5 mLs into the muscle once a week. On Friday., Disp: , Rfl:    torsemide (DEMADEX) 20 MG tablet, Take 40 mg by mouth 2 (two) times daily., Disp: , Rfl:    traMADol (ULTRAM) 50 MG tablet, Take 1 tablet (50 mg total) by  mouth in the morning, at noon, and at bedtime., Disp: 10 tablet, Rfl: 0  Current Facility-Administered Medications:    bupivacaine (MARCAINE) 0.5 % (with pres) injection 3 mL, 3 mL, Other, Once, Tyrell Antonio, MD   Objective:     Vitals:   09/19/23 1023  Pulse: 80  SpO2: 99%  Weight: (!) 311 lb (141.1 kg)  Height: 5\' 10"  (1.778 m)      Body mass index is 44.62 kg/m.    Physical Exam:    Gen: Appears well, nad, nontoxic and pleasant Neuro:sensation intact, strength is 5/5 with df/pf/inv/ev, muscle tone wnl Skin: no suspicious lesion or defmority Psych: A&O, appropriate mood and affect   Right shoulder:  No deformity, swelling or muscle wasting No scapular winging Passive ROM FF 150, abd 120, int 20, ext 70 Active ROM FF 90, abd 80, int 20, ext 70 NTTP over the [place], clavicle, ac, coracoid, biceps groove, humerus, deltoid, trapezius, cervical spine Special testing limited due to limited ROM Neg ant drawer, sulcus sign,   Negative Spurling's test bilat FROM of neck     Electronically signed by:  Aleen Sells [facility] 10:37 AM 09/19/23

## 2023-09-19 ENCOUNTER — Telehealth: Payer: Self-pay | Admitting: Sports Medicine

## 2023-09-19 ENCOUNTER — Ambulatory Visit (INDEPENDENT_AMBULATORY_CARE_PROVIDER_SITE_OTHER): Payer: 59 | Admitting: Sports Medicine

## 2023-09-19 VITALS — HR 80 | Ht 70.0 in | Wt 311.0 lb

## 2023-09-19 DIAGNOSIS — M25511 Pain in right shoulder: Secondary | ICD-10-CM | POA: Diagnosis not present

## 2023-09-19 DIAGNOSIS — M19011 Primary osteoarthritis, right shoulder: Secondary | ICD-10-CM

## 2023-09-19 DIAGNOSIS — G8929 Other chronic pain: Secondary | ICD-10-CM

## 2023-09-19 LAB — MICROSCOPIC EXAMINATION
Bacteria, UA: NONE SEEN
Casts: NONE SEEN /[LPF]
WBC, UA: >30 /[HPF] — AB (ref 0–5)

## 2023-09-19 LAB — URINE CULTURE

## 2023-09-19 LAB — URINALYSIS, ROUTINE W REFLEX MICROSCOPIC
Bilirubin, UA: NEGATIVE
Glucose, UA: NEGATIVE
Nitrite, UA: NEGATIVE
Specific Gravity, UA: 1.016 (ref 1.005–1.030)
Urobilinogen, Ur: 0.2 mg/dL (ref 0.2–1.0)
pH, UA: 5.5 (ref 5.0–7.5)

## 2023-09-19 NOTE — Patient Instructions (Signed)
Shoulder HEP  Pt referral  6-8 week follow up

## 2023-09-19 NOTE — Telephone Encounter (Signed)
Tammy Baccus from [facility] called to discuss this patient and the plan of care that was sent to them today. She can be reached at [phone].

## 2023-09-22 ENCOUNTER — Telehealth (HOSPITAL_BASED_OUTPATIENT_CLINIC_OR_DEPARTMENT_OTHER): Payer: Self-pay | Admitting: Family Medicine

## 2023-09-22 NOTE — Telephone Encounter (Signed)
Pt advised per Dr Tommi Rumps [place]'s note he is to follow up with urologist.

## 2023-09-22 NOTE — Telephone Encounter (Signed)
Pt called wanting to know if results from urine sample are available to go over with him over a phone call he also stated if he does not answer to leave a v/m with the info

## 2023-09-22 NOTE — Telephone Encounter (Signed)
I called and spoke with [name] from [name] at [phone]. She said that they have treated [name] at this facility on multiple occasions for a variety of different musculoskeletal issues with most recent therapy for right upper extremity completing in May 2024. She said that he is currently on a maintenance program that includes patches, stretching routine.  She questioned what the benefit would be of repeating a therapy course at this time, question what our end goals would be for [name], and question why he would need therapy if my documentation says that he has had mild improvement.  I informed [name] that our end goal of therapy and reason to repeat therapy would be increasing the patient's range of motion and decreasing his overall pain as patient still has limited range of motion and pain based on my physical exam.  While [name] has had mild improvement from my October visit compared to my November visit, this is because we gave patient home exercises which she has been performing on his own.  The facility has not performed therapy for [name] since May 2024.  I believe his symptoms, pain, and functionality can all be improved with another course of physical or occupational therapy.  Ms. [name] said that Occupational Therapy would reevaluate patient to see if they felt it was necessary for him to repeat therapy, and their opinion would dictate whether or not patient would get therapy, instead of my orders for therapy dictating whether or not patient gets therapy.  They have evaluated [name] twice since completing therapy and May 2024 and based on documentation [name] read to me, they did not feel it was necessary to repeat therapy for [name].  My recommendation and order for physical therapy and Occupational Therapy remains unchanged as I believe patient would benefit.

## 2023-09-25 ENCOUNTER — Ambulatory Visit: Payer: 59 | Admitting: Urology

## 2023-10-13 ENCOUNTER — Ambulatory Visit: Payer: 59 | Admitting: Urology

## 2023-10-14 ENCOUNTER — Telehealth (HOSPITAL_BASED_OUTPATIENT_CLINIC_OR_DEPARTMENT_OTHER): Payer: Self-pay | Admitting: *Deleted

## 2023-10-14 NOTE — Telephone Encounter (Signed)
LVM for pt to call the office regarding scheduling medicare wellness visit

## 2023-10-17 LAB — HM DIABETES EYE EXAM

## 2023-10-21 ENCOUNTER — Encounter (HOSPITAL_BASED_OUTPATIENT_CLINIC_OR_DEPARTMENT_OTHER): Payer: Self-pay | Admitting: *Deleted

## 2023-10-21 NOTE — Telephone Encounter (Signed)
 LVM to schedule medicare wellness visit

## 2023-10-22 ENCOUNTER — Encounter (HOSPITAL_BASED_OUTPATIENT_CLINIC_OR_DEPARTMENT_OTHER): Payer: Self-pay | Admitting: *Deleted

## 2023-10-23 ENCOUNTER — Ambulatory Visit: Payer: 59 | Admitting: Urology

## 2023-10-23 ENCOUNTER — Telehealth (HOSPITAL_BASED_OUTPATIENT_CLINIC_OR_DEPARTMENT_OTHER): Payer: Self-pay | Admitting: *Deleted

## 2023-10-23 NOTE — Telephone Encounter (Signed)
Pt messaged via mychart responded to him to let him know we need to schedule for medicare wellness visit

## 2023-10-23 NOTE — Telephone Encounter (Signed)
Copied from CRM [id number]. Topic: General - Other >> Oct 22, 2023  4:24 PM Antony Haste wrote: Reason for CRM: PT states he received missed call on 12/18 due to attending a meeting, PT is wanting to complete follow-up for missed call.  CB#: [phone]

## 2023-10-24 ENCOUNTER — Encounter (HOSPITAL_BASED_OUTPATIENT_CLINIC_OR_DEPARTMENT_OTHER): Payer: Self-pay | Admitting: Family

## 2023-10-24 ENCOUNTER — Ambulatory Visit (INDEPENDENT_AMBULATORY_CARE_PROVIDER_SITE_OTHER): Payer: 59 | Admitting: Family

## 2023-10-24 VITALS — BP 122/66 | HR 65 | Ht 70.0 in | Wt 322.6 lb

## 2023-10-24 DIAGNOSIS — D6859 Other primary thrombophilia: Secondary | ICD-10-CM

## 2023-10-24 DIAGNOSIS — I4821 Permanent atrial fibrillation: Secondary | ICD-10-CM | POA: Diagnosis not present

## 2023-10-24 DIAGNOSIS — I5032 Chronic diastolic (congestive) heart failure: Secondary | ICD-10-CM | POA: Diagnosis not present

## 2023-10-24 DIAGNOSIS — N1832 Chronic kidney disease, stage 3b: Secondary | ICD-10-CM | POA: Diagnosis not present

## 2023-10-24 DIAGNOSIS — I10 Essential (primary) hypertension: Secondary | ICD-10-CM

## 2023-10-24 DIAGNOSIS — I890 Lymphedema, not elsewhere classified: Secondary | ICD-10-CM

## 2023-10-24 NOTE — Progress Notes (Signed)
Cardiology Office Note:  .   Date:  10/24/2023  ID:  [name], DOB 10/15/1951, MRN [id number] PCP: de [place], Raymond J, MD  Nephrology: Dr. Marcene [facility] Providers Cardiologist:  [name], MD Cardiology APP:  Azalee Course, [place]  Nephrology: Dr. Ronalee Belts   History of Present Illness: .   [name] is a 72 y.o. male with a hx of morbid obesity, DM 2, chronic diastolic heart failure, persistent atrial fibrillation, hypertension, bilateral lower extremity lymphedema with chronic wounds.   Admitted 02/2022 with acute on chronic heart failure.  He noted eating a lot of high sodium meals while staying in a hotel waiting to move to a new apartment.  Diuresed with IV Lasix and treated for UTI.  Echo with EF 55 to 60%, RV not well visualized.  RHC with RA 18, PA 63/19 (35), Fick CO/Cl 10.5/3.8, Thermo CO/Cl 7.2/2.6, PVR 1.9 WU (thermo), PAPi 2.4.  There was suspected pulmonary hypertension due to combination of left heart disease and primary pulmonary hypertension related to untreated OSA and obesity.  He had a 78 pound weight loss during admission.  Discharged on torsemide 40 mg daily.  Discharge weight 333 pounds.   Initial TOC visit had gained 20pounds at SNF. Torsemide increased to 80mg  QD. Seen 04/05/22 still volume overloaded. Recommended take dose of metolazone for 2 days then transition to once per week with potassium.    Seen 04/16/22 noted to be taking Metolazone 2.5mg  and Torsemide 80mg  QD. Metolazone reduced to twice per week. Follow up labs 04/23/22 with creatinine 2.74, K 126, GFR 24 recommended for admission.    Hospitalized 6/20-6/22/23 given 500cc bolus and 100 mL/hr normal saline. Metolazone and Torsemide held. He was discharge don Torsemide. Discharge weight 139.7 kg.    At visit 06/17/22 noted SNF had not changed Metolazone from 5mg  to 2.5mg  as recommended by PCP. Kidney function was improved 06/19/22 with creatinine 1.37 with Torsemide 40mg  QD and  Metolazone 2.5mg  QD. He saw Dr. [name] 09/20/22 and was stable from a cardiac perspective with no changes made.   Seen 03/25/23,  Marcelline Deist initiated.  At visit 06/24/2023 noted successful weight loss of 334 pounds down to 301 pounds. Present regimen from SNF and nephrologist per SNF documentation Metolazone 5mg  M/W/F and Torsemide 40mg  BID continued.   Presents today for follow-up.  Notes nephrology has discontinued his metolazone.  He remains on torsemide 40 mg twice daily.  Weight has increased from 301 pounds to 322 pounds since visit 4 months ago.  Continues to work with PT at [facility] standing to do transfers and now using pedals underneath his chair. Notes decreased urinary output since not being on metolazone.  Last night in the evening after going to the restroom his urine looked dark like "tea". Reports lately sometimes when he starts going to the restroom will have a lot of pressure and a dull "pinch" couple days.  Describes as possible burning.  Has upcoming visit with nephrology 11/14/2023.  He did urinalysis last month with PCP for similar symptoms but was not clean-catch and was recommended follow-up with urology was has not yet been scheduled.  ROS: Please see the history of present illness.    All other systems reviewed and are negative.   Studies Reviewed: .        Cardiac Studies & Procedures   CARDIAC CATHETERIZATION  CARDIAC CATHETERIZATION 02/19/2022  Narrative Findings:  RA = 18 RV = 60/24 PA =  63/19 (35) PCW =  22 Fick cardiac output/index = 10.5/3.8 Thermo CO/CI = 7.2/2.6 PVR = 1.9 WU (Thermo) FA sat = 92% PA sat = 63%, 65% High SVC = 68% PAPi = 2.4  Assessment: 1. Marked volume overload with R>>L heart failure 2. Preserved cardiac output  Plan/Discussion:  Remains markedly volume overloaded. Consider use of lasix gtt +/- acetazolamide or metolazone to facilitate diuresis. Keep LEs wrapped.  Arvilla Meres, MD 9:20 AM     ECHOCARDIOGRAM  ECHOCARDIOGRAM COMPLETE 02/14/2022  Narrative ECHOCARDIOGRAM REPORT    Patient Name:   [name] Date of Exam: 02/14/2022 Medical Rec #:  [id number]          Height:       70.0 in Accession #:    [id number]         Weight:       411.2 lb Date of Birth:  02/05/1951           BSA:          2.835 m Patient Age:    70 years           BP:           115/50 mmHg Patient Gender: M                  HR:           60 bpm. Exam Location:  Inpatient  Procedure: 2D Echo and Intracardiac Opacification Agent  Indications:    Cardiomegaly  History:        Patient has prior history of Echocardiogram examinations, most recent 12/29/2017. Arrythmias:Atrial Fibrillation; Risk Factors:Diabetes and Hypertension.  Sonographer:    Eduard Roux Referring Phys: [id number] Kieth Brightly MACNEIL   Sonographer Comments: Patient is morbidly obese. Image acquisition challenging due to patient body habitus. IMPRESSIONS   1. Left ventricular ejection fraction, by estimation, is 55 to 60%. The left ventricle has normal function. The left ventricle has no regional wall motion abnormalities. There is moderate left ventricular hypertrophy. Left ventricular diastolic function could not be evaluated. 2. Right ventricular systolic function was not well visualized. The right ventricular size is not well visualized. 3. Left atrial size was mildly dilated. 4. The mitral valve is normal in structure. No evidence of mitral valve regurgitation. 5. The aortic valve is normal in structure. There is mild calcification of the aortic valve. Aortic valve regurgitation is trivial.  FINDINGS Left Ventricle: Left ventricular ejection fraction, by estimation, is 55 to 60%. The left ventricle has normal function. The left ventricle has no regional wall motion abnormalities. The left ventricular internal cavity size was normal in size. There is moderate left ventricular hypertrophy. Left ventricular diastolic  function could not be evaluated due to atrial fibrillation. Left ventricular diastolic function could not be evaluated.  Right Ventricle: The right ventricular size is not well visualized. Right vetricular wall thickness was not well visualized. Right ventricular systolic function was not well visualized.  Left Atrium: Left atrial size was mildly dilated.  Right Atrium: Right atrial size was normal in size.  Pericardium: There is no evidence of pericardial effusion.  Mitral Valve: The mitral valve is normal in structure. No evidence of mitral valve regurgitation.  Tricuspid Valve: The tricuspid valve is grossly normal. Tricuspid valve regurgitation is mild.  Aortic Valve: The aortic valve is normal in structure. There is mild calcification of the aortic valve. Aortic valve regurgitation is trivial. Aortic valve peak gradient measures 6.3 mmHg.  Pulmonic Valve: The pulmonic valve was normal in  structure. Pulmonic valve regurgitation is not visualized.  Aorta: The aortic root and ascending aorta are structurally normal, with no evidence of dilitation.  IAS/Shunts: The interatrial septum was not well visualized.   LEFT VENTRICLE PLAX 2D LVIDd:         5.10 cm   Diastology LVIDs:         3.70 cm   LV e' medial:    7.20 cm/s LV PW:         1.30 cm   LV E/e' medial:  17.6 LV IVS:        1.40 cm   LV e' lateral:   3.53 cm/s LVOT diam:     2.00 cm   LV E/e' lateral: 36.0 LV SV:         66 LV SV Index:   23 LVOT Area:     3.14 cm   IVC IVC diam: 3.70 cm  LEFT ATRIUM              Index LA diam:        4.80 cm  1.69 cm/m LA Vol (A2C):   108.0 ml 38.09 ml/m LA Vol (A4C):   75.4 ml  26.58 ml/m LA Biplane Vol: 107.0 ml 37.74 ml/m AORTIC VALVE                 PULMONIC VALVE AV Area (Vmax): 2.43 cm     PV Vmax:       0.90 m/s AV Vmax:        125.50 cm/s  PV Peak grad:  3.2 mmHg AV Peak Grad:   6.3 mmHg LVOT Vmax:      97.00 cm/s LVOT Vmean:     57.300 cm/s LVOT VTI:       0.211  m  AORTA Ao Root diam: 3.20 cm Ao Asc diam:  3.60 cm  MITRAL VALVE                TRICUSPID VALVE MV Area (PHT): 4.99 cm     TR Peak grad:   38.7 mmHg MV Decel Time: 152 msec     TR Vmax:        311.00 cm/s MV E velocity: 127.00 cm/s MV A velocity: 36.50 cm/s   SHUNTS MV E/A ratio:  3.48         Systemic VTI:  0.21 m Systemic Diam: 2.00 cm  Kristeen Miss MD Electronically signed by Kristeen Miss MD Signature Date/Time: 02/14/2022/5:04:52 PM    Final             Risk Assessment/Calculations:    CHA2DS2-VASc Score = 4   This indicates a 4.8% annual risk of stroke. The patient's score is based upon: CHF History: 1 HTN History: 1 Diabetes History: 1 Stroke History: 0 Vascular Disease History: 0 Age Score: 1 Gender Score: 0            Physical Exam:   VS:  BP 122/66   Pulse 65   Ht 5\' 10"  (1.778 m)   Wt (!) 322 lb 9.6 oz (146.3 kg)   SpO2 92%   BMI 46.29 kg/m    Wt Readings from Last 3 Encounters:  10/24/23 (!) 322 lb 9.6 oz (146.3 kg)  09/19/23 (!) 311 lb (141.1 kg)  09/17/23 (!) 311 lb 6.4 oz (141.3 kg)    GEN: Well nourished, overweight, well developed in no acute distress NECK: No JVD; No carotid bruits CARDIAC: IRIR, no murmurs, rubs, gallops RESPIRATORY:  Clear to  auscultation without rales, wheezing or rhonchi  ABDOMEN: Soft, non-tender, non-distended EXTREMITIES:  No edema; No deformity. Nonpitting bilateral LE edema. Bilateral LE with Unna boots.  ASSESSMENT AND PLAN: .   HFpEF / Right sided heart failure / Pulmonary hypertension -. Volume status difficult to ascertain due to body habitus. Weight up today but no worsened dyspnea nor edema. Bilateral LE in compression wrap with trace to nonpitting edema on exam. Per SNF records taking Torsemide 40mg  BID.  Nephrology had stopped metolazone.  Would defer diuretic dosing to nephrology team.  Update BMP, CBC, BMP today to assess volume status.  Additional GDMT Farxiga 10 mg daily.   Snores - Consider sleep  study when discharged from SNF. Given RHC and body habitus, plan for in lab split night sleep study.   Longstanding persistent atrial fibrillation -asymptomatic in regards to atrial fibrillation with no palpitations.  Rate controlled today continue diltiazem 120 mg daily.  Continue Eliquis 5 mg twice daily.  Denies bleeding complications.  Does not meet dose reduction criteria. Update BMP/CBC today.   CKDIIIb - Careful titration of diuretic and antihypertensive. Follows with Dr. Ronalee Belts.    DM2 - Currently managed at SNF.    HTN - BP well controlled. Continue current antihypertensive regimen.     Lymphedema- Bilateral LE compression wraps in place.     OA - Limits mobility. Focused on weight loss to hopefully qualify for surgery. Congratulated on progress with PT.        Dispo: follow up in 5 months  Signed, Alver Sorrow, NP

## 2023-10-24 NOTE — Patient Instructions (Signed)
Medication Instructions:  Your physician recommends that you continue on your current medications as directed. Please refer to the Current Medication list given to you today.   *If you need a refill on your cardiac medications before your next appointment, please call your pharmacy*   Lab Work: CBC / BMP / BNP TODAY  If you have labs (blood work) drawn today and your tests are completely normal, you will receive your results only by: MyChart Message (if you have MyChart) OR A paper copy in the mail If you have any lab test that is abnormal or we need to change your treatment, we will call you to review the results.   Follow-Up: At [facility], you and your health needs are our priority.  As part of our continuing mission to provide you with exceptional heart care, we have created designated Provider Care Teams.  These Care Teams include your primary Cardiologist (physician) and Advanced Practice Providers (APPs -  Physician Assistants and Nurse Practitioners) who all work together to provide you with the care you need, when you need it.  We recommend signing up for the patient portal called "MyChart".  Sign up information is provided on this After Visit Summary.  MyChart is used to connect with patients for Virtual Visits (Telemedicine).  Patients are able to view lab/test results, encounter notes, upcoming appointments, etc.  Non-urgent messages can be sent to your provider as well.   To learn more about what you can do with MyChart, go to [email or web address].    Your next appointment:   5 month(s)  Provider:   Gillian Shields, NP

## 2023-10-25 LAB — BASIC METABOLIC PANEL
BUN/Creatinine Ratio: 13 (ref 10–24)
BUN: 26 mg/dL (ref 8–27)
CO2: 24 mmol/L (ref 20–29)
Calcium: 8.8 mg/dL (ref 8.6–10.2)
Chloride: 98 mmol/L (ref 96–106)
Creatinine, Ser: 1.96 mg/dL — ABNORMAL HIGH (ref 0.76–1.27)
Glucose: 173 mg/dL — ABNORMAL HIGH (ref 70–99)
Potassium: 5.2 mmol/L (ref 3.5–5.2)
Sodium: 139 mmol/L (ref 134–144)
eGFR: 36 mL/min/{1.73_m2} — ABNORMAL LOW (ref >59)

## 2023-10-25 LAB — CBC
Hematocrit: 55.9 % — ABNORMAL HIGH (ref 37.5–51.0)
Hemoglobin: 17.9 g/dL — ABNORMAL HIGH (ref 13.0–17.7)
MCH: 29.5 pg (ref 26.6–33.0)
MCHC: 32 g/dL (ref 31.5–35.7)
MCV: 92 fL (ref 79–97)
Platelets: 218 10*3/uL (ref 150–450)
RBC: 6.06 x10E6/uL — ABNORMAL HIGH (ref 4.14–5.80)
RDW: 13.5 % (ref 11.6–15.4)
WBC: 8.6 10*3/uL (ref 3.4–10.8)

## 2023-10-25 LAB — BRAIN NATRIURETIC PEPTIDE: BNP: 180.3 pg/mL — ABNORMAL HIGH (ref 0.0–100.0)

## 2023-10-27 ENCOUNTER — Encounter: Payer: Self-pay | Admitting: Sports Medicine

## 2023-10-27 ENCOUNTER — Encounter (HOSPITAL_BASED_OUTPATIENT_CLINIC_OR_DEPARTMENT_OTHER): Payer: Self-pay

## 2023-10-27 ENCOUNTER — Telehealth (HOSPITAL_BASED_OUTPATIENT_CLINIC_OR_DEPARTMENT_OTHER): Payer: Self-pay

## 2023-10-27 NOTE — Telephone Encounter (Signed)
No answer at this time, unable to leave message. Will call again later.

## 2023-10-27 NOTE — Telephone Encounter (Signed)
Lab results faxed to [facility]. Fax confirmed.

## 2023-11-03 ENCOUNTER — Encounter (HOSPITAL_BASED_OUTPATIENT_CLINIC_OR_DEPARTMENT_OTHER): Payer: 59

## 2023-11-03 ENCOUNTER — Encounter (HOSPITAL_BASED_OUTPATIENT_CLINIC_OR_DEPARTMENT_OTHER): Payer: Self-pay

## 2023-11-03 NOTE — Progress Notes (Deleted)
 [name] [address] [place] [address] Phone: [phone]   Assessment and Plan:     There are no diagnoses linked to this encounter.  ***   Pertinent previous records reviewed include ***    Follow Up: ***     Subjective:   I, [name], am serving as a neurosurgeon for Doctor Morene Mace   Chief Complaint: right shoulder pain    HPI:    08/07/2023 Patient is a 72 year old male complaining of right shoulder pain. Patient states that he has decreased ROM . Decreased ROM  for about 2 weeks. No MOI. No numbness or tingling.    09/19/2023 Patient states that his pain is the same the facility did no therapy for him   11/07/2023 Patient states   Relevant Historical Information: History of atrial fibrillation on chronic anticoagulation with Eliquis , elevated BMI, DM type II, CKD   Additional pertinent review of systems negative.   Current Outpatient Medications:    apixaban  (ELIQUIS ) 5 MG TABS tablet, Take 5 mg by mouth 2 (two) times daily., Disp: , Rfl:    b complex vitamins capsule, Take 1 capsule by mouth every morning., Disp: , Rfl:    cetirizine (ZYRTEC) 10 MG tablet, Take 10 mg by mouth at bedtime., Disp: , Rfl:    Cholecalciferol (D-3-5) 125 MCG (5000 UT) capsule, Take 5,000 Units by mouth daily., Disp: , Rfl:    diltiazem  (TIAZAC ) 120 MG 24 hr capsule, Take 1 capsule (120 mg total) by mouth daily., Disp: 90 capsule, Rfl: 3   docusate sodium  (COLACE) 100 MG capsule, Take 100 mg by mouth 2 (two) times daily., Disp: , Rfl:    Dulaglutide  (TRULICITY ) 3 MG/0.5ML SOPN, Inject 3 mg as directed once a week., Disp: 6 mL, Rfl: 3   Febuxostat  80 MG TABS, Take 1 tablet (80 mg total) by mouth daily., Disp: , Rfl:    ferrous sulfate 325 (65 FE) MG tablet, Take 325 mg by mouth daily with breakfast., Disp: , Rfl:    glucose blood (ONETOUCH ULTRA) test strip, CHECK BLOOD SUGAR TWICE  DAILY, Disp: 200 strip, Rfl: 0    HYDROcodone -acetaminophen  (NORCO/VICODIN) 5-325 MG tablet, Take 1-2 tablets by mouth every 6 (six) hours as needed for moderate pain. 1-2 tabs po bid prn pain, Disp: 5 tablet, Rfl: 0   Insulin  Lispro Prot & Lispro (HUMALOG  75/25 MIX) (75-25) 100 UNIT/ML Kwikpen, Inject 42 Units into the skin 2 (two) times daily., Disp: , Rfl:    Insulin  Pen Needle (B-D ULTRAFINE III SHORT PEN) 31G X 8 MM MISC, USE 1 PENNEEDLE TWICE DAILY, Disp: 200 each, Rfl: 1   miconazole (MICOTIN) 2 % powder, Apply 1 Application topically in the morning and at bedtime. To abdominal fold., Disp: , Rfl:    Multiple Vitamin (MULTIVITAMIN) tablet, Take 1 tablet by mouth daily., Disp: 30 tablet, Rfl: 0   potassium chloride  SA (KLOR-CON  M20) 20 MEQ tablet, Take one tablet three times per week. (Patient taking differently: Take 20 mEq by mouth every Monday, Wednesday, and Friday. Take one tablet three times per week.), Disp: 45 tablet, Rfl: 2   terazosin  (HYTRIN ) 10 MG capsule, Take 10 mg by mouth at bedtime., Disp: , Rfl:    testosterone cypionate (DEPOTESTOSTERONE CYPIONATE) 200 MG/ML injection, Inject 0.5 mLs into the muscle once a week. On Friday., Disp: , Rfl:    torsemide  (DEMADEX ) 20 MG tablet, Take 40 mg by mouth 2 (two) times  daily., Disp: , Rfl:    traMADol  (ULTRAM ) 50 MG tablet, Take 1 tablet (50 mg total) by mouth in the morning, at noon, and at bedtime., Disp: 10 tablet, Rfl: 0  Current Facility-Administered Medications:    bupivacaine  (MARCAINE ) 0.5 % (with pres) injection 3 mL, 3 mL, Other, Once, Eldonna Novel, MD   Objective:     There were no vitals filed for this visit.    There is no height or weight on file to calculate BMI.    Physical Exam:    ***   Electronically signed by:  [name] [name] 7:37 AM 11/03/23

## 2023-11-07 ENCOUNTER — Ambulatory Visit: Payer: 59 | Admitting: Sports Medicine

## 2023-11-11 NOTE — Progress Notes (Signed)
 Ben Nakiea Metzner D.[facility] [address] [place] [address] Phone: [phone]   Assessment and Plan:     1. Acute pain of left shoulder 2. Subacromial bursitis of left shoulder joint 3. Primary osteoarthritis of left shoulder  -Chronic with exacerbation, initial sports medicine visit - Most consistent with subacromial bursitis of left shoulder likely occurring due to sleeping position leading on left shoulder.  Patient does have underlying osteoarthritis of left glenohumeral and AC joint, however subacromial bursitis appears to be his primary pain generator based on physical exam - Patient elected for subacromial CSI.  CSI may temporarily increase blood glucose in patient with past medical history of DM type II.  Tolerated well per note below - Recommend continuing Tylenol /ibuprofen as needed for pain relief - Continue gentle range of motion exercises and heating pads as needed for relief  Procedure: Subacromial Injection Side: left  Risks explained and consent was given verbally. The site was cleaned with alcohol prep. A steroid injection was performed from posterior approach using 2mL of 1% lidocaine  without epinephrine and 1mL of kenalog  40mg /ml. This was well tolerated and resulted in symptomatic relief.  Needle was removed, hemostasis achieved, and post injection instructions were explained.   Pt was advised to call or return to clinic if these symptoms worsen or fail to improve as anticipated.   Pertinent previous records reviewed include none  Follow Up: As needed if no improvement or worsening of symptoms   Subjective:   I, [name], am serving as a neurosurgeon for Doctor Morene Mace   Chief Complaint: right shoulder pain/ left shoulder pain     HPI:    08/07/2023 Patient is a 73 year old male complaining of right shoulder pain. Patient states that he has decreased ROM . Decreased ROM  for about 2 weeks. No MOI. No numbness  or tingling.    09/19/2023 Patient states that his pain is the same the facility did no therapy for him   11/12/2023 Patient states left shoulder pain he leans on his wheelchair. He fell asleep leaning on his left shoulder 3 weeks ago. Decreased ROM. Isnt able to move with out assistance from aright arm. Pain has decreased some. Pain is located anterior shoulder and looks like AC joint. Denies numbness and tingling. Ibu for the pain and that helps some. Has noticed a decrease in grip strength. Heat  helps with his pain    Relevant Historical Information: History of atrial fibrillation on chronic anticoagulation with Eliquis , elevated BMI, DM type II, CKD   Additional pertinent review of systems negative.   Current Outpatient Medications:    apixaban  (ELIQUIS ) 5 MG TABS tablet, Take 5 mg by mouth 2 (two) times daily., Disp: , Rfl:    b complex vitamins capsule, Take 1 capsule by mouth every morning., Disp: , Rfl:    cetirizine (ZYRTEC) 10 MG tablet, Take 10 mg by mouth at bedtime., Disp: , Rfl:    Cholecalciferol (D-3-5) 125 MCG (5000 UT) capsule, Take 5,000 Units by mouth daily., Disp: , Rfl:    diltiazem  (TIAZAC ) 120 MG 24 hr capsule, Take 1 capsule (120 mg total) by mouth daily., Disp: 90 capsule, Rfl: 3   docusate sodium  (COLACE) 100 MG capsule, Take 100 mg by mouth 2 (two) times daily., Disp: , Rfl:    Dulaglutide  (TRULICITY ) 3 MG/0.5ML SOPN, Inject 3 mg as directed once a week., Disp: 6 mL, Rfl: 3   Febuxostat  80 MG TABS, Take 1  tablet (80 mg total) by mouth daily., Disp: , Rfl:    ferrous sulfate 325 (65 FE) MG tablet, Take 325 mg by mouth daily with breakfast., Disp: , Rfl:    glucose blood (ONETOUCH ULTRA) test strip, CHECK BLOOD SUGAR TWICE  DAILY, Disp: 200 strip, Rfl: 0   HYDROcodone -acetaminophen  (NORCO/VICODIN) 5-325 MG tablet, Take 1-2 tablets by mouth every 6 (six) hours as needed for moderate pain. 1-2 tabs po bid prn pain, Disp: 5 tablet, Rfl: 0   Insulin  Lispro Prot & Lispro  (HUMALOG  75/25 MIX) (75-25) 100 UNIT/ML Kwikpen, Inject 42 Units into the skin 2 (two) times daily., Disp: , Rfl:    Insulin  Pen Needle (B-D ULTRAFINE III SHORT PEN) 31G X 8 MM MISC, USE 1 PENNEEDLE TWICE DAILY, Disp: 200 each, Rfl: 1   miconazole (MICOTIN) 2 % powder, Apply 1 Application topically in the morning and at bedtime. To abdominal fold., Disp: , Rfl:    Multiple Vitamin (MULTIVITAMIN) tablet, Take 1 tablet by mouth daily., Disp: 30 tablet, Rfl: 0   potassium chloride  SA (KLOR-CON  M20) 20 MEQ tablet, Take one tablet three times per week. (Patient taking differently: Take 20 mEq by mouth every Monday, Wednesday, and Friday. Take one tablet three times per week.), Disp: 45 tablet, Rfl: 2   terazosin  (HYTRIN ) 10 MG capsule, Take 10 mg by mouth at bedtime., Disp: , Rfl:    testosterone cypionate (DEPOTESTOSTERONE CYPIONATE) 200 MG/ML injection, Inject 0.5 mLs into the muscle once a week. On Friday., Disp: , Rfl:    torsemide  (DEMADEX ) 20 MG tablet, Take 40 mg by mouth 2 (two) times daily., Disp: , Rfl:    traMADol  (ULTRAM ) 50 MG tablet, Take 1 tablet (50 mg total) by mouth in the morning, at noon, and at bedtime., Disp: 10 tablet, Rfl: 0  Current Facility-Administered Medications:    bupivacaine  (MARCAINE ) 0.5 % (with pres) injection 3 mL, 3 mL, Other, Once, Eldonna Novel, MD   Objective:     Vitals:   11/12/23 1438  Pulse: 65  SpO2: (!) 9%  Weight: (!) 322 lb (146.1 kg)  Height: 5' 10 (1.778 m)      Body mass index is 46.2 kg/m.    Physical Exam:    Gen: Appears well, nad, nontoxic and pleasant Neuro:sensation intact, strength is 5/5 with df/pf/inv/ev, muscle tone wnl Skin: no suspicious lesion or defmority Psych: A&O, appropriate mood and affect   Left shoulder:  No deformity, swelling or muscle wasting No scapular winging FF 80, abd 80, int 30, ext 60 NTTP over the [place], clavicle, ac, coracoid, biceps groove, humerus, deltoid, trapezius, cervical spine Positive Neer,  Hawkins, empty can Neg speeds Neg ant drawer, sulcus sign, apprehension Negative Spurling's test bilat FROM of neck    Electronically signed by:  [name] [facility] 3:01 PM 11/12/23

## 2023-11-12 ENCOUNTER — Ambulatory Visit: Payer: 59

## 2023-11-12 ENCOUNTER — Ambulatory Visit: Payer: 59 | Admitting: Sports Medicine

## 2023-11-12 VITALS — HR 65 | Ht 70.0 in | Wt 322.0 lb

## 2023-11-12 DIAGNOSIS — M25512 Pain in left shoulder: Secondary | ICD-10-CM | POA: Diagnosis not present

## 2023-11-12 DIAGNOSIS — M7552 Bursitis of left shoulder: Secondary | ICD-10-CM

## 2023-11-12 DIAGNOSIS — M19012 Primary osteoarthritis, left shoulder: Secondary | ICD-10-CM

## 2023-11-12 NOTE — Patient Instructions (Addendum)
 Tylenol NSAID'S as needed Heat 2 x a day  Recommend gentle ROM exercises for left shoulder  4 week follow up

## 2023-11-18 ENCOUNTER — Ambulatory Visit: Payer: 59 | Admitting: Internal Medicine

## 2023-11-18 NOTE — Progress Notes (Deleted)
 Office Visit Note  Patient: [name]             Date of Birth: 16-Dec-1950           MRN: [id number]             PCP: de [name], Kevin J, MD Referring: Kevin Leisure, MD Visit Date: 11/18/2023   Subjective:  No chief complaint on file.   History of Present Illness: [name] is a 73 y.o. male here for follow up ***   Previous HPI 08/19/23 [name] is a 73 y.o. male here for evaluation of joint pain and swelling and erythema especially with recent episode of severe left hand and wrist swelling but also has some persistent symptoms with pain in the right wrist and chronic swelling in both lower legs.  Multiple comorbidities including congestive heart failure, hypertension, A-fib, venous insufficiency, asthma, type 2 diabetes with associated neuropathy, lymphedema, and chronic kidney disease.  He did not recall a history of gout until this in the past year he feels symptoms have been much worse since moving to his current living facility setting.  This has also been accompanied by adjustments for diuretic medications and some exacerbation of chronic kidney disease.  He saw Dr. Leonce in sports medicine clinic in March noting hyperuricemia since at least June 2023 not on any urate lowering therapy did improve with short course of oral NSAIDs.  He has suffered a severe pain and swelling in his left hand in February at the wrist subsequently some swelling and right hand PIP joints more recently has persistent trouble with right wrist pain describes a pinching type of sensation occurs usually provoked after use.   07/2023 eGFR 16   01/2023 Uric acid 13.7   No Rheumatology ROS completed.   PMFS History:  Patient Active Problem List   Diagnosis Date Noted   Urinary frequency 09/17/2023   Pain in right wrist 08/19/2023   Chronic gout due to renal impairment 08/19/2023   Onychomycosis 08/19/2023   Acute renal failure superimposed on stage 3b chronic kidney disease,  unspecified acute renal failure type (HCC) 06/26/2023   Hypokalemia 06/26/2023   Primary osteoarthritis of right knee 06/04/2022   Primary osteoarthritis of left knee 06/04/2022   Body mass index 45.0-49.9, adult (HCC) 06/04/2022   AKI (acute kidney injury) (HCC) 04/25/2022   Acute renal failure superimposed on stage 3b chronic kidney disease (HCC) 04/23/2022   Hyponatremia 04/23/2022   Pressure injury of skin 03/07/2022   Acute kidney injury superimposed on chronic kidney disease (HCC)    Obesity, Class III, BMI 40-49.9 (morbid obesity) (HCC)    Acute on chronic diastolic CHF (congestive heart failure) (HCC) 02/13/2022   Uncontrolled type 2 diabetes mellitus with hyperglycemia (HCC) 03/04/2018   Chronic diastolic CHF (congestive heart failure) (HCC) 12/24/2016   Lymphedema 02/22/2016   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   Chronic headaches 05/10/2015   Equivocal stress test 10/24/2014   Persistent atrial fibrillation (HCC) 08/15/2014   Healthcare maintenance 08/15/2014   Type 2 diabetes mellitus with diabetic neuropathy (HCC) 08/15/2014   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   Recurrent cellulitis of lower extremity 08/02/2013   Lymphedema of both lower extremities 11/04/2008   Morbid obesity due to excess calories (HCC) 12/14/2007   Venous (peripheral) insufficiency 07/22/2007   Uncontrolled type 2 diabetes mellitus with hyperglycemia, with long-term current use of insulin  (HCC) 08/11/2006   Hypertension, essential 08/11/2006   ALLERGIC RHINITIS 08/11/2006   Asthma 08/11/2006  Past Medical History:  Diagnosis Date   ALLERGIC RHINITIS 08/11/2006   Arthritis 2020   ASTHMA 08/11/2006   Bradycardia    Beta blocker d/c'd 2022   Chronic atrial fibrillation (HCC) 08/2014   Chronic combined systolic and diastolic CHF (congestive heart failure) [facility])    Cardiology f/u 12/2017: pt volume overloaded (R heart dysf suspected), BNP very high, lasix  increased.  Repeat echo  12/2017: normal LV EF, mild DD, +RV syst dysfxn, mod pulm HTN, biatrial enlgmt.   Chronic constipation    Chronic renal insufficiency, stage 3 (moderate) (HCC)    Colon cancer screening 02/2021   02/2021 Cologuard POS->GI ref   DIABETES MELLITUS, TYPE II 08/11/2006   Heart murmur 1990   AFib   HYPERTENSION 08/11/2006   IDA (iron deficiency anemia)    04/2022.  Iron started.  Needs hemoccults   Impaired mobility and endurance    MRSA infection 06/2018   LL venous stasis ulcer infected   Normocytic anemia 2016-2019   03/2018 B12 normal, iron ok (ferritin borderline low).   OBESITY, MORBID 12/14/2007   saxenda started 05/2020 by [facility] wt mgmt center   OSA (obstructive sleep apnea)    to get sleep study with [facility]) as of 12/03/2018 consult.   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  [id number] ID started him on amoxil  prophylaxis---ineffective.  End 2018/Jan 2019 penicillin  G injections prophyl helpful but pt declined to continue this as of 01/2018 ID f/u.  ID talked him into resuming monthly penicillin  G as of 02/2018 f/u.     Restless leg syndrome    Rx'd clonazepam  09/2017 and pt refused to take it after reading the medication's potential side effects.   Venous stasis ulcers of both lower extremities (HCC)    Severe lymphedema.  wound clinic care ongoing as of 01/2018    Family History  Problem Relation Age of Onset   Diabetes Mother    Hearing loss Father    Dementia Sister    Pancreatic cancer Sister    Cancer Maternal Grandmother    Past Surgical History:  Procedure Laterality Date   Carotid dopplers  07/23/2018   Left NORMAL.  Right 1-39% ICA stenosis, with <50% distal CCA stenosis (not hemodynamically significant)   LE venous dopplers Bilateral 03/06/2022   no dvt   RIGHT HEART CATH N/A 02/19/2022   Marked volume overload, R>>L HF, preserved CO. Procedure: RIGHT HEART CATH;  Surgeon: Kevin Toribio SAUNDERS, MD;  Location: [facility] INVASIVE CV LAB;  Service: Cardiovascular;  Laterality: N/A;   TRANSTHORACIC ECHOCARDIOGRAM  11/2007; 09/2014; 11/2015;12/2017   LV fxn normal, EF normal, mild dilation of left atrium.  2015 grade II diast dysfxn.  2017 EF 55-60%. 2019: LVEF 60-65%, mild RV syst dysf,biatrial enlgmt, mod pulm htn. 02/14/22 EF 55-60%, unable to assess diast fxn, LVH, o/w normal   URETERAL STENT PLACEMENT     virtual colonoscopy  01/2011   Normal   Social History   Social History Narrative         Immunization History  Administered Date(s) Administered   Fluad Quad(high Dose 65+) 07/06/2019, 07/12/2020, 07/11/2021, 07/10/2022   Fluad Trivalent(High Dose 65+) 08/04/2023   Influenza Split 08/19/2011, 08/11/2012   Influenza Whole 08/11/2006, 08/17/2008, 08/08/2009, 09/17/2010   Influenza, High Dose Seasonal PF 08/05/2016, 09/08/2018   Influenza,inj,Quad PF,6+ Mos 07/26/2013, 09/11/2015, 09/15/2017   PFIZER(Purple Top)SARS-COV-2 Vaccination 12/14/2019, 01/10/2020, 09/15/2020   Pneumococcal Conjugate-13 08/05/2016   Pneumococcal  Polysaccharide-23 04/22/2011, 12/08/2017   Td 04/10/2009   Zoster, Live 04/22/2011     Objective: Vital Signs: There were no vitals taken for this visit.   Physical Exam   Musculoskeletal Exam: ***  CDAI Exam: CDAI Score: -- Patient Global: --; Provider Global: -- Swollen: --; Tender: -- Joint Exam 11/18/2023   No joint exam has been documented for this visit   There is currently no information documented on the homunculus. Go to the Rheumatology activity and complete the homunculus joint exam.  Investigation: No additional findings.  Imaging: DG Shoulder Left Result Date: 11/17/2023 CLINICAL DATA:  Left shoulder pain for 3 weeks.  No known injury. EXAM: LEFT SHOULDER - 2+ VIEW COMPARISON:  None Available. FINDINGS: There is diffuse decreased bone mineralization. Mild glenohumeral joint space narrowing and mild inferior glenoid and humeral  head-neck junction degenerative osteophytosis. Mild acromioclavicular joint space narrowing and peripheral osteophytosis. No acute fracture or dislocation. IMPRESSION: Mild glenohumeral and acromioclavicular osteoarthritis. Electronically Signed   By: Tanda Lyons M.D.   On: 11/17/2023 22:39    Recent Labs: Lab Results  Component Value Date   WBC 8.6 10/24/2023   HGB 17.9 (H) 10/24/2023   PLT 218 10/24/2023   NA 139 10/24/2023   K 5.2 10/24/2023   CL 98 10/24/2023   CO2 24 10/24/2023   GLUCOSE 173 (H) 10/24/2023   BUN 26 10/24/2023   CREATININE 1.96 (H) 10/24/2023   BILITOT 0.6 12/23/2022   ALKPHOS 119 12/23/2022   AST 20 12/23/2022   ALT 11 12/23/2022   PROT 6.4 12/23/2022   ALBUMIN  2.9 (L) 06/27/2023   CALCIUM  8.8 10/24/2023   GFRAA 63 02/09/2020    Speciality Comments: No specialty comments available.  Procedures:  No procedures performed Allergies: Other and Hydralazine    Assessment / Plan:     Visit Diagnoses: No diagnosis found.  ***  Orders: No orders of the defined types were placed in this encounter.  No orders of the defined types were placed in this encounter.    Follow-Up Instructions: No follow-ups on file.   Lonni LELON Ester, MD  Note - This record has been created using [employer].  Chart creation errors have been sought, but may not always  have been located. Such creation errors do not reflect on  the standard of medical care.

## 2023-11-19 ENCOUNTER — Ambulatory Visit: Payer: 59 | Admitting: Physician Assistant

## 2023-11-20 ENCOUNTER — Encounter (HOSPITAL_BASED_OUTPATIENT_CLINIC_OR_DEPARTMENT_OTHER): Payer: Self-pay

## 2023-11-20 ENCOUNTER — Ambulatory Visit (HOSPITAL_BASED_OUTPATIENT_CLINIC_OR_DEPARTMENT_OTHER): Payer: 59 | Admitting: *Deleted

## 2023-11-20 ENCOUNTER — Ambulatory Visit (HOSPITAL_BASED_OUTPATIENT_CLINIC_OR_DEPARTMENT_OTHER): Payer: Self-pay | Admitting: Family Medicine

## 2023-11-20 ENCOUNTER — Other Ambulatory Visit (HOSPITAL_BASED_OUTPATIENT_CLINIC_OR_DEPARTMENT_OTHER): Payer: Self-pay | Admitting: *Deleted

## 2023-11-20 DIAGNOSIS — Z1159 Encounter for screening for other viral diseases: Secondary | ICD-10-CM | POA: Diagnosis not present

## 2023-11-20 DIAGNOSIS — Z0000 Encounter for general adult medical examination without abnormal findings: Secondary | ICD-10-CM | POA: Diagnosis not present

## 2023-11-20 DIAGNOSIS — J309 Allergic rhinitis, unspecified: Secondary | ICD-10-CM

## 2023-11-20 MED ORDER — FLUTICASONE PROPIONATE 50 MCG/ACT NA SUSP
2.0000 | Freq: Every day | NASAL | 6 refills | Status: AC
Start: 2023-11-20 — End: ?

## 2023-11-20 NOTE — Telephone Encounter (Signed)
Copied from CRM [id number]. Topic: Clinical - Medication Question >> Nov 20, 2023  8:14 AM Ivette P wrote: Reason for CRM: Pt calling in to get prescription of Pronaise Nasal Spray, pt states he needs it for congestion. He needs permission to get medication over the counter. Requesting callback [phone]   Chief Complaint: Medication Refill/Question  Additional Notes: Pt currently at [facility] was prescribed 14-day supply of Flonas  Reason for Disposition  [1] Caller has NON-URGENT medicine question about med that PCP prescribed AND [2] triager unable to answer question  Answer Assessment - Initial Assessment Questions 1. NAME of MEDICINE: "What medicine(s) are you calling about?"     Flonase  2. QUESTION: "What is your question?" (e.g., double dose of medicine, side effect)     Patient needs an order so it can be administered at Skilled Nursing Facility  3. PRESCRIBER: "Who prescribed the medicine?" Reason: if prescribed by specialist, call should be referred to that group.     NP Marisue Ivan- at [facility] and healthcare center,  Fax # [phone] Phone Number- [phone]  4. SYMPTOMS: "Do you have any symptoms?" If Yes, ask: "What symptoms are you having?"  "How bad are the symptoms (e.g., mild, moderate, severe)     Nasal Congestion; Seasonal Allergies  Protocols used: Medication Question Call-A-AH

## 2023-11-20 NOTE — Progress Notes (Signed)
Subjective:   [name] is a 73 y.o. male who presents for Medicare Annual/Subsequent preventive examination.  Visit Complete: Virtual I connected with  [name] on 11/20/23 by a audio enabled telemedicine application and verified that I am speaking with the correct person using two identifiers.  Patient Location: Home  Provider Location: Office/Clinic  I discussed the limitations of evaluation and management by telemedicine. The patient expressed understanding and agreed to proceed.  Vital Signs: Because this visit was a virtual/telehealth visit, some criteria may be missing or patient reported. Any vitals not documented were not able to be obtained and vitals that have been documented are patient reported.  Patient Medicare AWV questionnaire was completed by the patient on 11/20/23; I have confirmed that all information answered by patient is correct and no changes since this date.        Objective:    There were no vitals filed for this visit. There is no height or weight on file to calculate BMI.     06/26/2023    2:00 PM 06/26/2023   12:45 PM 07/13/2022    5:32 PM 04/23/2022   10:33 PM 04/23/2022    3:42 PM 02/13/2022    6:23 PM 02/13/2022    3:33 PM  Advanced Directives  Does Patient Have a Medical Advance Directive? No No No  No No No  Would patient like information on creating a medical advance directive? No - Patient declined No - Patient declined No - Patient declined No - Patient declined  No - Patient declined     Current Medications (verified) Outpatient Encounter Medications as of 11/20/2023  Medication Sig   apixaban (ELIQUIS) 5 MG TABS tablet Take 5 mg by mouth 2 (two) times daily.   b complex vitamins capsule Take 1 capsule by mouth every morning.   cetirizine (ZYRTEC) 10 MG tablet Take 10 mg by mouth at bedtime.   Cholecalciferol (D-3-5) 125 MCG (5000 UT) capsule Take 5,000 Units by mouth daily.   diltiazem (TIAZAC) 120 MG 24 hr capsule Take  1 capsule (120 mg total) by mouth daily.   docusate sodium (COLACE) 100 MG capsule Take 100 mg by mouth 2 (two) times daily.   Dulaglutide (TRULICITY) 3 MG/0.5ML SOPN Inject 3 mg as directed once a week.   Febuxostat 80 MG TABS Take 1 tablet (80 mg total) by mouth daily.   ferrous sulfate 325 (65 FE) MG tablet Take 325 mg by mouth daily with breakfast.   fluticasone (FLONASE) 50 MCG/ACT nasal spray Place 2 sprays into both nostrils daily.   glucose blood (ONETOUCH ULTRA) test strip CHECK BLOOD SUGAR TWICE  DAILY   HYDROcodone-acetaminophen (NORCO/VICODIN) 5-325 MG tablet Take 1-2 tablets by mouth every 6 (six) hours as needed for moderate pain. 1-2 tabs po bid prn pain   Insulin Lispro Prot & Lispro (HUMALOG 75/25 MIX) (75-25) 100 UNIT/ML Kwikpen Inject 42 Units into the skin 2 (two) times daily.   Insulin Pen Needle (B-D ULTRAFINE III SHORT PEN) 31G X 8 MM MISC USE 1 PENNEEDLE TWICE DAILY   miconazole (MICOTIN) 2 % powder Apply 1 Application topically in the morning and at bedtime. To abdominal fold.   Multiple Vitamin (MULTIVITAMIN) tablet Take 1 tablet by mouth daily.   potassium chloride SA (KLOR-CON M20) 20 MEQ tablet Take one tablet three times per week. (Patient taking differently: Take 20 mEq by mouth every Monday, Wednesday, and Friday. Take one tablet three times per week.)   terazosin (HYTRIN) 10  MG capsule Take 10 mg by mouth at bedtime.   testosterone cypionate (DEPOTESTOSTERONE CYPIONATE) 200 MG/ML injection Inject 0.5 mLs into the muscle once a week. On Friday.   torsemide (DEMADEX) 20 MG tablet Take 40 mg by mouth 2 (two) times daily.   traMADol (ULTRAM) 50 MG tablet Take 1 tablet (50 mg total) by mouth in the morning, at noon, and at bedtime.   Facility-Administered Encounter Medications as of 11/20/2023  Medication   bupivacaine (MARCAINE) 0.5 % (with pres) injection 3 mL    Allergies (verified) Other and Hydralazine   History: Past Medical History:  Diagnosis Date    ALLERGIC RHINITIS 08/11/2006   Arthritis 2020   ASTHMA 08/11/2006   Bradycardia    Beta blocker d/c'd 2022   Chronic atrial fibrillation (HCC) 08/2014   Chronic combined systolic and diastolic CHF (congestive heart failure) [facility])    Cardiology f/u 12/2017: pt volume overloaded (R heart dysf suspected), BNP very high, lasix increased.  Repeat echo 12/2017: normal LV EF, mild DD, +RV syst dysfxn, mod pulm HTN, biatrial enlgmt.   Chronic constipation    Chronic renal insufficiency, stage 3 (moderate) (HCC)    Colon cancer screening 02/2021   02/2021 Cologuard POS->GI ref   DIABETES MELLITUS, TYPE II 08/11/2006   Heart murmur 1990   AFib   HYPERTENSION 08/11/2006   IDA (iron deficiency anemia)    04/2022.  Iron started.  Needs hemoccults   Impaired mobility and endurance    MRSA infection 06/2018   LL venous stasis ulcer infected   Normocytic anemia 2016-2019   03/2018 B12 normal, iron ok (ferritin borderline low).   OBESITY, MORBID 12/14/2007   saxenda started 05/2020 by [facility] wt mgmt center   OSA (obstructive sleep apnea)    to get sleep study with [facility]) as of 12/03/2018 consult.   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  [id number] ID started him on amoxil prophylaxis---ineffective.  End 2018/Jan 2019 penicillin G injections prophyl helpful but pt declined to continue this as of 01/2018 ID f/u.  ID talked him into resuming monthly penicillin G as of 02/2018 f/u.     Restless leg syndrome    Rx'd clonazepam 09/2017 and pt refused to take it after reading the medication's potential side effects.   Venous stasis ulcers of both lower extremities (HCC)    Severe lymphedema.  wound clinic care ongoing as of 01/2018   Past Surgical History:  Procedure Laterality Date   Carotid dopplers  07/23/2018   Left NORMAL.  Right 1-39% ICA stenosis, with <50% distal CCA stenosis (not hemodynamically significant)   LE  venous dopplers Bilateral 03/06/2022   no dvt   RIGHT HEART CATH N/A 02/19/2022   Marked volume overload, R>>L HF, preserved CO. Procedure: RIGHT HEART CATH;  Surgeon: Dolores Patty, MD;  Location: [facility] INVASIVE CV LAB;  Service: Cardiovascular;  Laterality: N/A;   TRANSTHORACIC ECHOCARDIOGRAM  11/2007; 09/2014; 11/2015;12/2017   LV fxn normal, EF normal, mild dilation of left atrium.  2015 grade II diast dysfxn.  2017 EF 55-60%. 2019: LVEF 60-65%, mild RV syst dysf,biatrial enlgmt, mod pulm htn. 02/14/22 EF 55-60%, unable to assess diast fxn, LVH, o/w normal   URETERAL STENT PLACEMENT     virtual colonoscopy  01/2011   Normal   Family History  Problem Relation Age of Onset   Diabetes Mother    Hearing loss Father    Dementia Sister  Pancreatic cancer Sister    Cancer Maternal Grandmother    Social History   Socioeconomic History   Marital status: Single    Spouse name: Not on file   Number of children: 1   Years of education: 14   Highest education level: Bachelor's degree (e.g., BA, AB, BS)  Occupational History   Occupation: retired  Tobacco Use   Smoking status: Never    Passive exposure: Never   Smokeless tobacco: Never  Vaping Use   Vaping status: Never Used  Substance and Sexual Activity   Alcohol use: Not Currently    Comment: Stopped late 70s   Drug use: Never   Sexual activity: Not Currently  Other Topics Concern   Not on file  Social History Narrative         Social Drivers of Health   Financial Resource Strain: Low Risk  (10/06/2022)   Overall Financial Resource Strain (CARDIA)    Difficulty of Paying Living Expenses: Not hard at all  Food Insecurity: No Food Insecurity (09/13/2023)   Hunger Vital Sign    Worried About Running Out of Food in the Last Year: Never true    Ran Out of Food in the Last Year: Never true  Transportation Needs: Unmet Transportation Needs (09/13/2023)   PRAPARE - Transportation    Lack of Transportation (Medical): No    Lack  of Transportation (Non-Medical): Yes  Physical Activity: Unknown (09/13/2023)   Exercise Vital Sign    Days of Exercise per Week: 0 days    Minutes of Exercise per Session: Not on file  Stress: No Stress Concern Present (09/13/2023)   [employer] of Occupational Health - Occupational Stress Questionnaire    Feeling of Stress : Only a little  Social Connections: Moderately Integrated (09/13/2023)   Social Connection and Isolation Panel [NHANES]    Frequency of Communication with Friends and Family: More than three times a week    Frequency of Social Gatherings with Friends and Family: More than three times a week    Attends Religious Services: More than 4 times per year    Active Member of [employer] or Organizations: Yes    Attends Banker Meetings: 1 to 4 times per year    Marital Status: Never married    Tobacco Counseling Counseling given: Not Answered   Clinical Intake:                        Activities of Daily Living    10/30/2023    8:38 AM 06/26/2023    5:00 PM  In your present state of health, do you have any difficulty performing the following activities:  Hearing? 0   Vision? 0   Difficulty concentrating or making decisions? 0   Walking or climbing stairs? 1   Dressing or bathing? 1   Doing errands, shopping? 1 1  Preparing Food and eating ? N   Using the Toilet? Y   In the past six months, have you accidently leaked urine? Y   Do you have problems with loss of bowel control? N   Managing your Medications? N   Managing your Finances? N   Housekeeping or managing your Housekeeping? Y     Patient Care Team: [place], Buren Kos, MD as PCP - General (Family Medicine) [name] Lisette Abu, MD as PCP - Cardiology (Cardiology) [name] Lisette Abu, MD as Consulting Physician (Cardiology) Maxwell Caul, MD as Consulting Physician (Internal Medicine) [name],  MD as Consulting Physician (Cardiology) Azalee Course, [place] as Physician Assistant  (Cardiology) Mayer Camel, MD as Consulting Physician (General Surgery)  Indicate any recent Medical Services you may have received from other than [facility] providers in the past year (date may be approximate).     Assessment:   This is a routine wellness examination for [place].  Hearing/Vision screen No results found.   Goals Addressed   None   Depression Screen    09/17/2023   10:29 AM 10/10/2022   11:05 AM 02/13/2022    1:52 PM 11/21/2021   12:14 PM 10/24/2021    1:42 PM 09/25/2021    8:41 PM 09/19/2021    8:35 AM  PHQ 2/9 Scores  PHQ - 2 Score 0 0 2 0 0  6  PHQ- 9 Score 0 0 6    20     Information is confidential and restricted. Go to Review Flowsheets to unlock data.     Fall Risk    10/30/2023    8:38 AM 09/17/2023   10:28 AM 03/22/2023    9:29 AM 10/06/2022    9:08 AM 02/13/2022    1:59 PM  Fall Risk   Falls in the past year? 0 0 1 1 0  Number falls in past yr: 0 0 0 0 0  Injury with Fall? 1 0 1 1 0  Risk for fall due to :  No Fall Risks   Impaired mobility;Impaired balance/gait;Impaired vision  Follow up  Falls evaluation completed   Falls prevention discussed    MEDICARE RISK AT HOME:    TIMED UP AND GO:  Was the test performed?  No    Cognitive Function:        Immunizations Immunization History  Administered Date(s) Administered   Fluad Quad(high Dose 65+) 07/06/2019, 07/12/2020, 07/11/2021, 07/10/2022   Fluad Trivalent(High Dose 65+) 08/04/2023   Influenza Split 08/19/2011, 08/11/2012   Influenza Whole 08/11/2006, 08/17/2008, 08/08/2009, 09/17/2010   Influenza, High Dose Seasonal PF 08/05/2016, 09/08/2018   Influenza,inj,Quad PF,6+ Mos 07/26/2013, 09/11/2015, 09/15/2017   PFIZER(Purple Top)SARS-COV-2 Vaccination 12/14/2019, 01/10/2020, 09/15/2020   Pneumococcal Conjugate-13 08/05/2016   Pneumococcal Polysaccharide-23 04/22/2011, 12/08/2017   Td 04/10/2009   Zoster, Live 04/22/2011    TDAP status: Due, Education has been provided  regarding the importance of this vaccine. Advised may receive this vaccine at local pharmacy or Health Dept. Aware to provide a copy of the vaccination record if obtained from local pharmacy or Health Dept. Verbalized acceptance and understanding.  Flu Vaccine status: Up to date  Pneumococcal vaccine status: Up to date  Covid-19 vaccine status: Completed vaccines  Qualifies for Shingles Vaccine? Yes   Zostavax completed Yes   Shingrix Completed?: No.    Education has been provided regarding the importance of this vaccine. Patient has been advised to call insurance company to determine out of pocket expense if they have not yet received this vaccine. Advised may also receive vaccine at local pharmacy or Health Dept. Verbalized acceptance and understanding.  Screening Tests Health Maintenance  Topic Date Due   Zoster Vaccines- Shingrix (1 of 2) 04/05/1970   DTaP/Tdap/Td (2 - Tdap) 04/11/2019   FOOT EXAM  01/10/2023   COVID-19 Vaccine (4 - 2024-25 season) 07/06/2023   Diabetic kidney evaluation - Urine ACR  10/11/2023   Hepatitis C Screening  08/10/2026 (Originally 04/05/1969)   HEMOGLOBIN A1C  12/27/2023   Fecal DNA (Cologuard)  02/27/2024   OPHTHALMOLOGY EXAM  10/16/2024   Diabetic kidney evaluation - eGFR  measurement  10/23/2024   Medicare Annual Wellness (AWV)  11/19/2024   Pneumonia Vaccine 51+ Years old  Completed   INFLUENZA VACCINE  Completed   HPV VACCINES  Aged Out   Colonoscopy  Discontinued    Health Maintenance  Health Maintenance Due  Topic Date Due   Zoster Vaccines- Shingrix (1 of 2) 04/05/1970   DTaP/Tdap/Td (2 - Tdap) 04/11/2019   FOOT EXAM  01/10/2023   COVID-19 Vaccine (4 - 2024-25 season) 07/06/2023   Diabetic kidney evaluation - Urine ACR  10/11/2023    Colorectal cancer screening: Type of screening: Cologuard. Completed 02/26/21. Repeat every 02/27/24 years  Lung Cancer Screening: (Low Dose CT Chest recommended if Age 50-80 years, 20 pack-year currently  smoking OR have quit w/in 15years.) does not qualify.   Lung Cancer Screening Referral: NA  Additional Screening:  Hepatitis C Screening: does qualify; Ordered for patient   Vision Screening: Recommended annual ophthalmology exams for early detection of glaucoma and other disorders of the eye. Is the patient up to date with their annual eye exam?  Yes  Who is the provider or what is the name of the office in which the patient attends annual eye exams? [facility] If pt is not established with a provider, would they like to be referred to a provider to establish care? No .   Dental Screening: Recommended annual dental exams for proper oral hygiene  Diabetic Foot Exam: Pt is due for exam will do at next appointment  Community Resource Referral / Chronic Care Management: CRR required this visit?  No   CCM required this visit?  No     Plan:     I have personally reviewed and noted the following in the patient's chart:   Medical and social history Use of alcohol, tobacco or illicit drugs  Current medications and supplements including opioid prescriptions. Patient is currently taking opioid prescriptions. Information provided to patient regarding non-opioid alternatives. Patient advised to discuss non-opioid treatment plan with their provider. Functional ability and status Nutritional status Physical activity Advanced directives List of other physicians Hospitalizations, surgeries, and ER visits in previous 12 months Vitals Screenings to include cognitive, depression, and falls Referrals and appointments  In addition, I have reviewed and discussed with patient certain preventive protocols, quality metrics, and best practice recommendations. A written personalized care plan for preventive services as well as general preventive health recommendations were provided to patient.     Park Breed, CMA   11/20/2023   After Visit Summary: (MyChart) Due to  this being a telephonic visit, the after visit summary with patients personalized plan was offered to patient via MyChart   Nurse Notes:  Mr. Kibby , Thank you for taking time to come for your Medicare Wellness Visit. I appreciate your ongoing commitment to your health goals. Please review the following plan we discussed and let me know if I can assist you in the future.   These are the goals we discussed:  Goals      Patient Stated     Eat healthier, increase activity & drink more water     Patient Stated     Get fluid off body and join a gym         This is a list of the screening recommended for you and due dates:  Health Maintenance  Topic Date Due   Zoster (Shingles) Vaccine (1 of 2) 04/05/1970   DTaP/Tdap/Td vaccine (2 - Tdap) 04/11/2019  Complete foot exam   01/10/2023   COVID-19 Vaccine (4 - 2024-25 season) 07/06/2023   Yearly kidney health urinalysis for diabetes  10/11/2023   Hepatitis C Screening  08/10/2026*   Hemoglobin A1C  12/27/2023   Cologuard (Stool DNA test)  02/27/2024   Eye exam for diabetics  10/16/2024   Yearly kidney function blood test for diabetes  10/23/2024   Medicare Annual Wellness Visit  11/19/2024   Pneumonia Vaccine  Completed   Flu Shot  Completed   HPV Vaccine  Aged Out   Colon Cancer Screening  Discontinued  *Topic was postponed. The date shown is not the original due date.

## 2023-11-20 NOTE — Patient Instructions (Signed)
[name] , Thank you for taking time to come for your Medicare Wellness Visit. I appreciate your ongoing commitment to your health goals. Please review the following plan we discussed and let me know if I can assist you in the future.   Screening recommendations/referrals: Colonoscopy: completed cologuard will due again in April Recommended yearly ophthalmology/optometry visit for glaucoma screening and checkup Recommended yearly dental visit for hygiene and checkup  Vaccinations: Influenza vaccine: completed Pneumococcal vaccine: completed Tdap vaccine: due now Shingles vaccine: got one due for 2nd     Advanced directives: information provided  Conditions/risks identified: hypertension and falls   Next appointment: 1 year  Preventive Care 65 Years and Older, Male Preventive care refers to lifestyle choices and visits with your health care provider that can promote health and wellness. What does preventive care include? A yearly physical exam. This is also called an annual well check. Dental exams once or twice a year. Routine eye exams. Ask your health care provider how often you should have your eyes checked. Personal lifestyle choices, including: Daily care of your teeth and gums. Regular physical activity. Eating a healthy diet. Avoiding tobacco and drug use. Limiting alcohol use. Practicing safe sex. Taking low doses of aspirin every day. Taking vitamin and mineral supplements as recommended by your health care provider. What happens during an annual well check? The services and screenings done by your health care provider during your annual well check will depend on your age, overall health, lifestyle risk factors, and family history of disease. Counseling  Your health care provider may ask you questions about your: Alcohol use. Tobacco use. Drug use. Emotional well-being. Home and relationship well-being. Sexual activity. Eating habits. History of falls. Memory  and ability to understand (cognition). Work and work Astronomer. Screening  You may have the following tests or measurements: Height, weight, and BMI. Blood pressure. Lipid and cholesterol levels. These may be checked every 5 years, or more frequently if you are over 58 years old. Skin check. Lung cancer screening. You may have this screening every year starting at age 8 if you have a 30-pack-year history of smoking and currently smoke or have quit within the past 15 years. Fecal occult blood test (FOBT) of the stool. You may have this test every year starting at age 76. Flexible sigmoidoscopy or colonoscopy. You may have a sigmoidoscopy every 5 years or a colonoscopy every 10 years starting at age 21. Prostate cancer screening. Recommendations will vary depending on your family history and other risks. Hepatitis C blood test. Hepatitis B blood test. Sexually transmitted disease (STD) testing. Diabetes screening. This is done by checking your blood sugar (glucose) after you have not eaten for a while (fasting). You may have this done every 1-3 years. Abdominal aortic aneurysm (AAA) screening. You may need this if you are a current or former smoker. Osteoporosis. You may be screened starting at age 22 if you are at high risk. Talk with your health care provider about your test results, treatment options, and if necessary, the need for more tests. Vaccines  Your health care provider may recommend certain vaccines, such as: Influenza vaccine. This is recommended every year. Tetanus, diphtheria, and acellular pertussis (Tdap, Td) vaccine. You may need a Td booster every 10 years. Zoster vaccine. You may need this after age 46. Pneumococcal 13-valent conjugate (PCV13) vaccine. One dose is recommended after age 72. Pneumococcal polysaccharide (PPSV23) vaccine. One dose is recommended after age 76. Talk to your health care provider about  which screenings and vaccines you need and how often you  need them. This information is not intended to replace advice given to you by your health care provider. Make sure you discuss any questions you have with your health care provider. Document Released: 11/17/2015 Document Revised: 07/10/2016 Document Reviewed: 08/22/2015 Elsevier Interactive Patient Education  2017 [employer].  Fall Prevention in the Home Falls can cause injuries. They can happen to people of all ages. There are many things you can do to make your home safe and to help prevent falls. What can I do on the outside of my home? Regularly fix the edges of walkways and driveways and fix any cracks. Remove anything that might make you trip as you walk through a door, such as a raised step or threshold. Trim any bushes or trees on the path to your home. Use bright outdoor lighting. Clear any walking paths of anything that might make someone trip, such as rocks or tools. Regularly check to see if handrails are loose or broken. Make sure that both sides of any steps have handrails. Any raised decks and porches should have guardrails on the edges. Have any leaves, snow, or ice cleared regularly. Use sand or salt on walking paths during winter. Clean up any spills in your garage right away. This includes oil or grease spills. What can I do in the bathroom? Use night lights. Install grab bars by the toilet and in the tub and shower. Do not use towel bars as grab bars. Use non-skid mats or decals in the tub or shower. If you need to sit down in the shower, use a plastic, non-slip stool. Keep the floor dry. Clean up any water that spills on the floor as soon as it happens. Remove soap buildup in the tub or shower regularly. Attach bath mats securely with double-sided non-slip rug tape. Do not have throw rugs and other things on the floor that can make you trip. What can I do in the bedroom? Use night lights. Make sure that you have a light by your bed that is easy to reach. Do not  use any sheets or blankets that are too big for your bed. They should not hang down onto the floor. Have a firm chair that has side arms. You can use this for support while you get dressed. Do not have throw rugs and other things on the floor that can make you trip. What can I do in the kitchen? Clean up any spills right away. Avoid walking on wet floors. Keep items that you use a lot in easy-to-reach places. If you need to reach something above you, use a strong step stool that has a grab bar. Keep electrical cords out of the way. Do not use floor polish or wax that makes floors slippery. If you must use wax, use non-skid floor wax. Do not have throw rugs and other things on the floor that can make you trip. What can I do with my stairs? Do not leave any items on the stairs. Make sure that there are handrails on both sides of the stairs and use them. Fix handrails that are broken or loose. Make sure that handrails are as long as the stairways. Check any carpeting to make sure that it is firmly attached to the stairs. Fix any carpet that is loose or worn. Avoid having throw rugs at the top or bottom of the stairs. If you do have throw rugs, attach them to the floor with  carpet tape. Make sure that you have a light switch at the top of the stairs and the bottom of the stairs. If you do not have them, ask someone to add them for you. What else can I do to help prevent falls? Wear shoes that: Do not have high heels. Have rubber bottoms. Are comfortable and fit you well. Are closed at the toe. Do not wear sandals. If you use a stepladder: Make sure that it is fully opened. Do not climb a closed stepladder. Make sure that both sides of the stepladder are locked into place. Ask someone to hold it for you, if possible. Clearly mark and make sure that you can see: Any grab bars or handrails. First and last steps. Where the edge of each step is. Use tools that help you move around (mobility  aids) if they are needed. These include: Canes. Walkers. Scooters. Crutches. Turn on the lights when you go into a dark area. Replace any light bulbs as soon as they burn out. Set up your furniture so you have a clear path. Avoid moving your furniture around. If any of your floors are uneven, fix them. If there are any pets around you, be aware of where they are. Review your medicines with your doctor. Some medicines can make you feel dizzy. This can increase your chance of falling. Ask your doctor what other things that you can do to help prevent falls. This information is not intended to replace advice given to you by your health care provider. Make sure you discuss any questions you have with your health care provider. Document Released: 08/17/2009 Document Revised: 03/28/2016 Document Reviewed: 11/25/2014 Elsevier Interactive Patient Education  2017 [employer].

## 2023-11-20 NOTE — Telephone Encounter (Signed)
Can you please advise on nasal spray since provider is out of office. It is not on medication list for pt

## 2023-11-20 NOTE — Progress Notes (Signed)
Name: [name] DOB: 03-08-1951 MRN: [id number]  History of Present Illness: Mr. Wegner is a 73 y.o. male who presents today at [facility] [place]. He resides at [facility] and is accompanied by his driver Coralee Rud). - GU history: 1. BPH with LUTS (frequency, nocturia, straining to void, sensations of incomplete bladder emptying).  At initial visit with Dr. Annabell Howells on 06/19/2023: 1. History of recurrent UTI's. "He has acquired phimosis with a buried penis so a voided urine will always be contaminated.  It is difficult to say he has truly had UTI's.  He would need a cath UA and culture prior to future UTI treatment." Patient typically uses a bedpan since he isn't ambulatory. 2. Acquired phimosis with a buried penis. "He will need to continue to lose weight and if the problem persists after weight loss, he could be considered for reconstructive surgery, but he would need to lose a lot of weight and possibly have a panniculectomy which would require a tertiary care referral."  Since last visit: > 08/15/2023:  - UA showed small leukocytes, 1+ blood, no nitrites. Negative urine culture.  - Note: Unreliable result (voided specimen collected from bedpan).  > 09/17/2023:  - Urine microscopy showed >30 WBC/hpf, 11-30 RBC/hpf, no bacteria. Negative urine culture.  - Note: Unreliable result (voided specimen collected from bedpan).  > 11/25/2023:  - Urine microscopy showed 11-20 WBC/hpf, otherwise unremarkable (no microscopic hematuria).  - Note: Reliable result because urine specimen collected via sterile I&O cath.  Today: He reports that he often voids throughout the night almost hourly due to diuretic use but during the day when he's up in his chair he often can go 6-8 hours between voiding and then notices that the urine seems darker in color ("like a dark tea"). Denies gross hematuria. About once per week he notices a small amount of blood when he dabs  with toilet tissue on the tip of his penis. Reports rare dysuria. He denies urinary incontinence.  Fall Screening: Do you usually have a device to assist in your mobility? Yes - wheelchair  Medications: Current Outpatient Medications  Medication Sig Dispense Refill   AMBULATORY NON FORMULARY MEDICATION In and out catheterization to obtain sterile urine specimen for urine microscopy and urine culture. Please fax results to [facility] [place], attn: Evette Georges, NP no later than Wednesday 11/26/2023. Fax # [phone]. 1 each PRN   apixaban (ELIQUIS) 5 MG TABS tablet Take 5 mg by mouth 2 (two) times daily.     b complex vitamins capsule Take 1 capsule by mouth every morning.     cetirizine (ZYRTEC) 10 MG tablet Take 10 mg by mouth at bedtime.     Cholecalciferol (D-3-5) 125 MCG (5000 UT) capsule Take 5,000 Units by mouth daily.     diltiazem (TIAZAC) 120 MG 24 hr capsule Take 1 capsule (120 mg total) by mouth daily. 90 capsule 3   docusate sodium (COLACE) 100 MG capsule Take 100 mg by mouth 2 (two) times daily.     Dulaglutide (TRULICITY) 3 MG/0.5ML SOPN Inject 3 mg as directed once a week. 6 mL 3   Febuxostat 80 MG TABS Take 1 tablet (80 mg total) by mouth daily.     ferrous sulfate 325 (65 FE) MG tablet Take 325 mg by mouth daily with breakfast.     fluticasone (FLONASE) 50 MCG/ACT nasal spray Place 2 sprays into both nostrils daily. 16 g 6   glucose blood (ONETOUCH ULTRA) test  strip CHECK BLOOD SUGAR TWICE  DAILY 200 strip 0   HYDROcodone-acetaminophen (NORCO/VICODIN) 5-325 MG tablet Take 1-2 tablets by mouth every 6 (six) hours as needed for moderate pain. 1-2 tabs po bid prn pain 5 tablet 0   Insulin Lispro Prot & Lispro (HUMALOG 75/25 MIX) (75-25) 100 UNIT/ML Kwikpen Inject 42 Units into the skin 2 (two) times daily.     Insulin Pen Needle (B-D ULTRAFINE III SHORT PEN) 31G X 8 MM MISC USE 1 PENNEEDLE TWICE DAILY 200 each 1   miconazole (MICOTIN) 2 % powder Apply 1 Application  topically in the morning and at bedtime. To abdominal fold.     Multiple Vitamin (MULTIVITAMIN) tablet Take 1 tablet by mouth daily. 30 tablet 0   potassium chloride SA (KLOR-CON M20) 20 MEQ tablet Take one tablet three times per week. (Patient taking differently: Take 20 mEq by mouth every Monday, Wednesday, and Friday. Take one tablet three times per week.) 45 tablet 2   terazosin (HYTRIN) 10 MG capsule Take 10 mg by mouth at bedtime.     testosterone cypionate (DEPOTESTOSTERONE CYPIONATE) 200 MG/ML injection Inject 0.5 mLs into the muscle once a week. On Friday.     torsemide (DEMADEX) 20 MG tablet Take 40 mg by mouth 2 (two) times daily.     traMADol (ULTRAM) 50 MG tablet Take 1 tablet (50 mg total) by mouth in the morning, at noon, and at bedtime. 10 tablet 0   Current Facility-Administered Medications  Medication Dose Route Frequency Provider Last Rate Last Admin   bupivacaine (MARCAINE) 0.5 % (with pres) injection 3 mL  3 mL Other Once Tyrell Antonio, MD        Allergies: Allergies  Allergen Reactions   Other Other (See Comments)    Sneezing, coughing   Hydralazine Other (See Comments)    Drowsiness/sedation/mental fog    Past Medical History:  Diagnosis Date   ALLERGIC RHINITIS 08/11/2006   Arthritis 2020   ASTHMA 08/11/2006   Bradycardia    Beta blocker d/c'd 2022   Chronic atrial fibrillation (HCC) 08/2014   Chronic combined systolic and diastolic CHF (congestive heart failure) [facility])    Cardiology f/u 12/2017: pt volume overloaded (R heart dysf suspected), BNP very high, lasix increased.  Repeat echo 12/2017: normal LV EF, mild DD, +RV syst dysfxn, mod pulm HTN, biatrial enlgmt.   Chronic constipation    Chronic renal insufficiency, stage 3 (moderate) (HCC)    Colon cancer screening 02/2021   02/2021 Cologuard POS->GI ref   DIABETES MELLITUS, TYPE II 08/11/2006   Heart murmur 1990   AFib   HYPERTENSION 08/11/2006   IDA (iron deficiency anemia)    04/2022.  Iron  started.  Needs hemoccults   Impaired mobility and endurance    MRSA infection 06/2018   LL venous stasis ulcer infected   Normocytic anemia 2016-2019   03/2018 B12 normal, iron ok (ferritin borderline low).   OBESITY, MORBID 12/14/2007   saxenda started 05/2020 by [facility] wt mgmt center   OSA (obstructive sleep apnea)    to get sleep study with [facility]) as of 12/03/2018 consult.   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  [id number] ID started him on amoxil prophylaxis---ineffective.  End 2018/Jan 2019 penicillin G injections prophyl helpful but pt declined to continue this as of 01/2018 ID f/u.  ID talked him into resuming monthly penicillin G as of 02/2018 f/u.     Restless  leg syndrome    Rx'd clonazepam 09/2017 and pt refused to take it after reading the medication's potential side effects.   Venous stasis ulcers of both lower extremities (HCC)    Severe lymphedema.  wound clinic care ongoing as of 01/2018   Past Surgical History:  Procedure Laterality Date   Carotid dopplers  07/23/2018   Left NORMAL.  Right 1-39% ICA stenosis, with <50% distal CCA stenosis (not hemodynamically significant)   LE venous dopplers Bilateral 03/06/2022   no dvt   RIGHT HEART CATH N/A 02/19/2022   Marked volume overload, R>>L HF, preserved CO. Procedure: RIGHT HEART CATH;  Surgeon: Dolores Patty, MD;  Location: [facility] INVASIVE CV LAB;  Service: Cardiovascular;  Laterality: N/A;   TRANSTHORACIC ECHOCARDIOGRAM  11/2007; 09/2014; 11/2015;12/2017   LV fxn normal, EF normal, mild dilation of left atrium.  2015 grade II diast dysfxn.  2017 EF 55-60%. 2019: LVEF 60-65%, mild RV syst dysf,biatrial enlgmt, mod pulm htn. 02/14/22 EF 55-60%, unable to assess diast fxn, LVH, o/w normal   URETERAL STENT PLACEMENT     virtual colonoscopy  01/2011   Normal   Family History  Problem Relation Age of Onset   Diabetes Mother    Hearing loss  Father    Dementia Sister    Pancreatic cancer Sister    Cancer Maternal Grandmother    Social History   Socioeconomic History   Marital status: Single    Spouse name: Not on file   Number of children: 1   Years of education: 14   Highest education level: Bachelor's degree (e.g., BA, AB, BS)  Occupational History   Occupation: retired  Tobacco Use   Smoking status: Never    Passive exposure: Never   Smokeless tobacco: Never  Vaping Use   Vaping status: Never Used  Substance and Sexual Activity   Alcohol use: Not Currently    Comment: Stopped late 70s   Drug use: Never   Sexual activity: Not Currently  Other Topics Concern   Not on file  Social History Narrative         Social Drivers of Health   Financial Resource Strain: Low Risk  (11/20/2023)   Overall Financial Resource Strain (CARDIA)    Difficulty of Paying Living Expenses: Not hard at all  Food Insecurity: No Food Insecurity (11/20/2023)   Hunger Vital Sign    Worried About Running Out of Food in the Last Year: Never true    Ran Out of Food in the Last Year: Never true  Transportation Needs: No Transportation Needs (11/20/2023)   PRAPARE - Administrator, Civil Service (Medical): No    Lack of Transportation (Non-Medical): No  Recent Concern: Transportation Needs - Unmet Transportation Needs (09/13/2023)   PRAPARE - Transportation    Lack of Transportation (Medical): No    Lack of Transportation (Non-Medical): Yes  Physical Activity: Inactive (11/20/2023)   Exercise Vital Sign    Days of Exercise per Week: 0 days    Minutes of Exercise per Session: 0 min  Stress: No Stress Concern Present (11/20/2023)   [employer] of Occupational Health - Occupational Stress Questionnaire    Feeling of Stress : Not at all  Social Connections: Moderately Isolated (11/20/2023)   Social Connection and Isolation Panel [NHANES]    Frequency of Communication with Friends and Family: More than three times a week     Frequency of Social Gatherings with Friends and Family: More than three times a week  Attends Religious Services: 1 to 4 times per year    Active Member of Clubs or Organizations: No    Attends Banker Meetings: Never    Marital Status: Never married  Intimate Partner Violence: Not At Risk (11/20/2023)   Humiliation, Afraid, Rape, and Kick questionnaire    Fear of Current or Ex-Partner: No    Emotionally Abused: No    Physically Abused: No    Sexually Abused: No    Review of Systems Constitutional: Patient denies any unintentional weight loss or change in strength lntegumentary: Patient denies any rashes or pruritus Cardiovascular: Patient denies chest pain or syncope Respiratory: Patient denies shortness of breath Gastrointestinal: Patient denies nausea, vomiting, constipation, or diarrhea Musculoskeletal: Patient denies muscle cramps or weakness Neurologic: Patient denies convulsions or seizures Allergic/Immunologic: Patient denies recent allergic reaction(s) Hematologic/Lymphatic: Patient denies bleeding tendencies Endocrine: Patient denies heat/cold intolerance  GU: As per HPI.  OBJECTIVE Vitals:   11/28/23 0940  BP: 137/87  Pulse: 75  Temp: 97.8 F (36.6 C)   There is no height or weight on file to calculate BMI.  Physical Examination Constitutional: No obvious distress; patient is non-toxic appearing  Cardiovascular: No visible lower extremity edema.  Respiratory: The patient does not have audible wheezing/stridor; respirations do not appear labored  Gastrointestinal: Abdomen non-distended Musculoskeletal: Normal ROM of UEs  Skin: No obvious rashes/open sores  Neurologic: CN 2-12 grossly intact Psychiatric: Answered questions appropriately with normal affect  Hematologic/Lymphatic/Immunologic: No obvious bruises or sites of spontaneous bleeding  UA not collected due to patient being non-ambulatory with no acute symptoms today  ASSESSMENT Stage 3  chronic kidney disease, unspecified whether stage 3a or 3b CKD (HCC)  We reviewed recent I&O cath urine microscopy results, which showed no evidence of microscopic hematuria. No acute concerns at this time. We discussed why his voided urine specimens are considered to be unreliable for diagnostic testing and he will require I&O cath for urine specimen collection for any future concerns such as possible UTI or microscopy hematuria. Pt verbalized understanding. We agreed to follow up on an as-needed basis. All questions were answered.  PLAN Advised the following: 1. Return if symptoms worsen or fail to improve.  No orders of the defined types were placed in this encounter.   It has been explained that the patient is to follow regularly with their PCP in addition to all other providers involved in their care and to follow instructions provided by these respective offices. Patient advised to contact urology clinic if any urologic-pertaining questions, concerns, new symptoms or problems arise in the interim period.  There are no Patient Instructions on file for this visit.  Electronically signed by:  Donnita Falls, FNP   11/28/23    10:26 AM

## 2023-11-20 NOTE — Telephone Encounter (Signed)
Medication sent to pharmacy  

## 2023-11-20 NOTE — Telephone Encounter (Signed)
Copied from CRM [id number]. Topic: Clinical - Medication Refill >> Nov 20, 2023  8:12 AM Bobbye Morton wrote: Most Recent Primary Care Visit:  Provider: DE Peru, RAYMOND J  Department: [facility]  Visit Type: NEW PATIENT  Date: 09/17/2023  Medication: Pronaize, Nasal Spray   Has the patient contacted their pharmacy? Yes (Agent: If no, request that the patient contact the pharmacy for the refill. If patient does not wish to contact the pharmacy document the reason why and proceed with request.) (Agent: If yes, when and what did the pharmacy advise?)  Is this the correct pharmacy for this prescription? Yes If no, delete pharmacy and type the correct one.  This is the patient's preferred pharmacy:  [facility] [address] [place], [place] - [address] [place] [address] Phone: [phone] Fax: [phone]   Has the prescription been filled recently? No  Is the patient out of the medication? Yes  Has the patient been seen for an appointment in the last year OR does the patient have an upcoming appointment? Yes  Can we respond through MyChart? Yes  Agent: Please be advised that Rx refills may take up to 3 business days. We ask that you follow-up with your pharmacy.

## 2023-11-21 ENCOUNTER — Telehealth: Payer: Self-pay

## 2023-11-21 ENCOUNTER — Encounter: Payer: Self-pay | Admitting: Urology

## 2023-11-21 ENCOUNTER — Other Ambulatory Visit: Payer: Self-pay | Admitting: Urology

## 2023-11-21 DIAGNOSIS — N390 Urinary tract infection, site not specified: Secondary | ICD-10-CM

## 2023-11-21 DIAGNOSIS — N138 Other obstructive and reflux uropathy: Secondary | ICD-10-CM

## 2023-11-21 DIAGNOSIS — N471 Phimosis: Secondary | ICD-10-CM

## 2023-11-21 DIAGNOSIS — N4883 Acquired buried penis: Secondary | ICD-10-CM

## 2023-11-21 DIAGNOSIS — R319 Hematuria, unspecified: Secondary | ICD-10-CM

## 2023-11-21 MED ORDER — AMBULATORY NON FORMULARY MEDICATION
99 refills | Status: DC
Start: 2023-11-21 — End: 2024-01-15

## 2023-11-21 NOTE — Telephone Encounter (Signed)
Spoke to patient nurse Okey Regal. Okey Regal states she need a written order sent to faculty [phone].

## 2023-11-21 NOTE — Telephone Encounter (Signed)
-----   Message from Donnita Falls sent at 11/20/2023  4:40 PM EST ----- This patient is on my schedule next Friday 11/28/23 for "hematuria" based on voided UA results from 08/15/2023 and 09/17/2023 at his facility [facility]).   Based on Dr. Belva Crome note from 06/19/2023 those results are considered to be unreliable however because "He has acquired phimosis with a buried penis so a voided urine will always be contaminated" (plus patient voids into a bedpan since he is non-ambulatory).   Since we are typically unable to transfer non-ambulatory in our clinic, would likely be unable to obtain an I&O cath urine specimen here.   Please contact [facility] and instruct them to obtain an I&O cath urine specimen and send for urine microscopy. Please have result sent to our office for review prior to patient's scheduled appointment on 11/28/23; may not truly warrant hematuria evaluation if urine microscopy is negative.   Thanks!

## 2023-11-26 ENCOUNTER — Encounter: Payer: Self-pay | Admitting: Urology

## 2023-11-27 ENCOUNTER — Ambulatory Visit: Payer: 59 | Admitting: Urology

## 2023-11-28 ENCOUNTER — Ambulatory Visit (INDEPENDENT_AMBULATORY_CARE_PROVIDER_SITE_OTHER): Payer: 59 | Admitting: Urology

## 2023-11-28 ENCOUNTER — Encounter: Payer: Self-pay | Admitting: Urology

## 2023-11-28 VITALS — BP 137/87 | HR 75 | Temp 97.8°F

## 2023-11-28 DIAGNOSIS — N183 Chronic kidney disease, stage 3 unspecified: Secondary | ICD-10-CM

## 2023-11-28 DIAGNOSIS — N189 Chronic kidney disease, unspecified: Secondary | ICD-10-CM | POA: Insufficient documentation

## 2023-12-10 ENCOUNTER — Ambulatory Visit: Payer: 59 | Admitting: Sports Medicine

## 2023-12-12 IMAGING — DX DG KNEE 3 VIEWS*R*
1 series · 4 of 4 positions shown · non-contrast
Comparison: None Available.

CLINICAL DATA: Pain right knee

EXAM:
RIGHT KNEE - 3 VIEW

[Series 1: knee · 0.14mm/px · 4 of 4 slices shown]
[im 1/4]
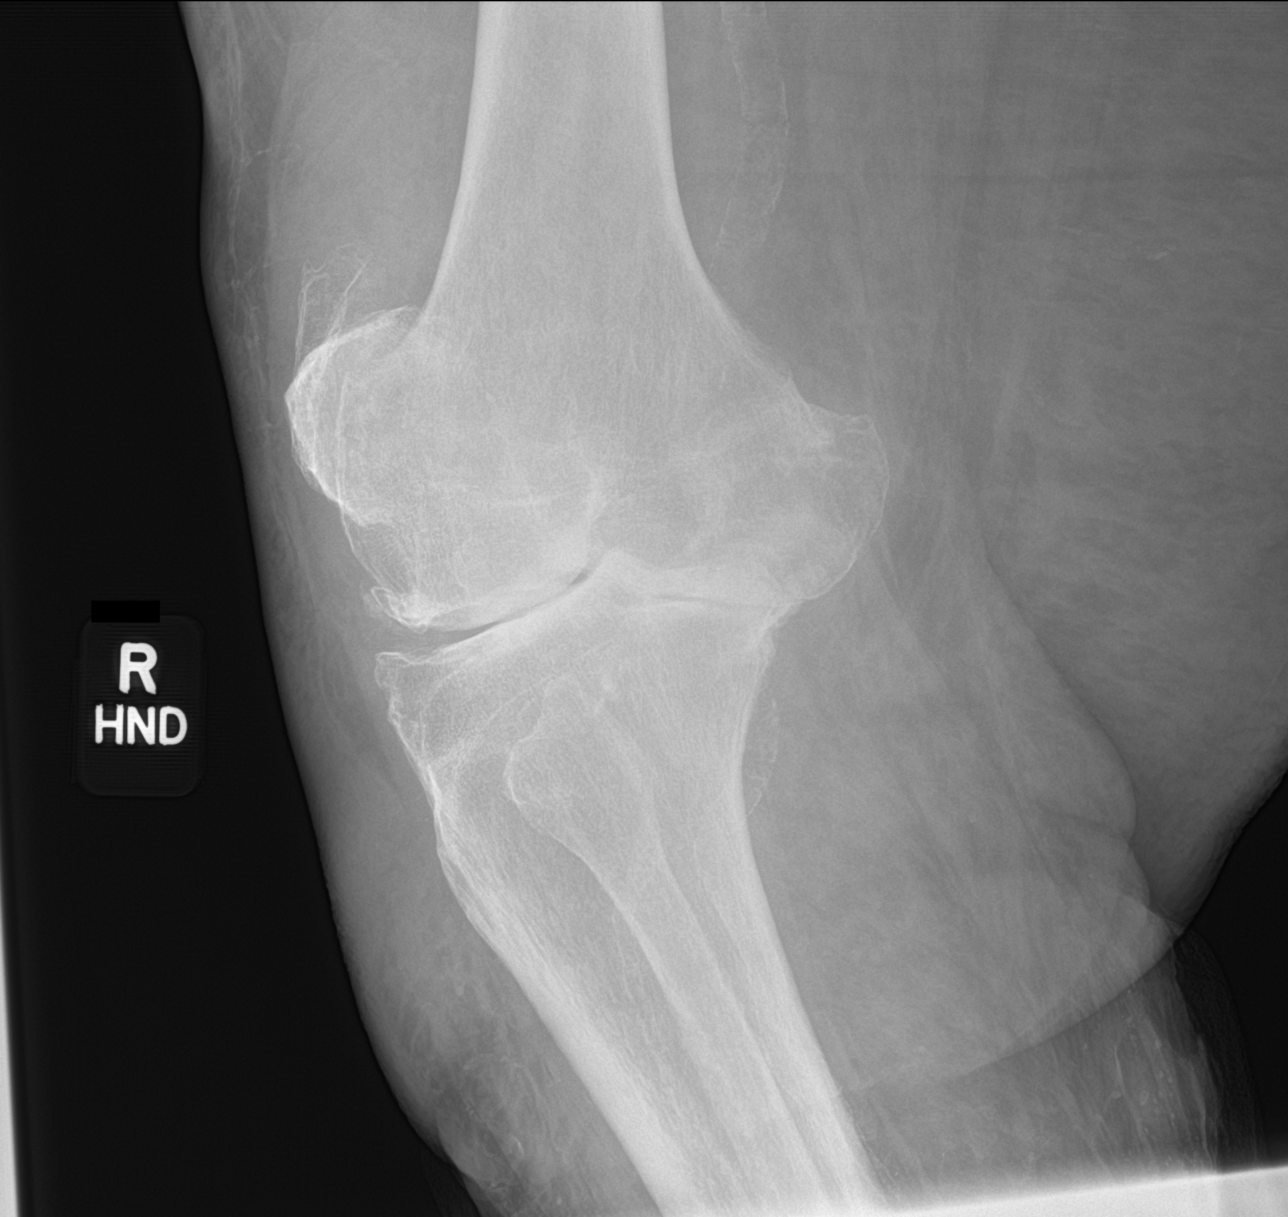
[im 2/4]
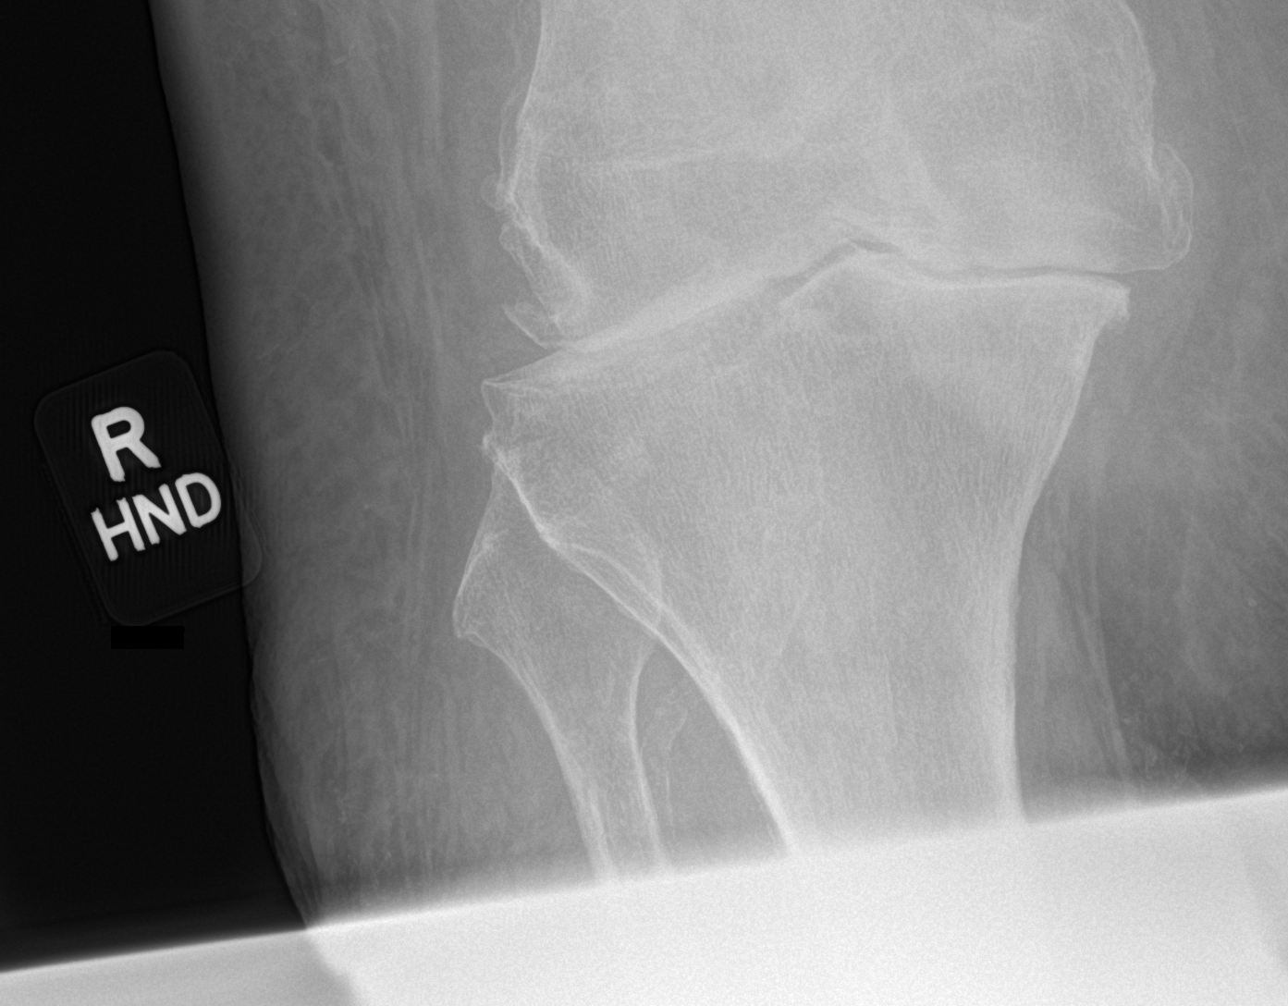
[im 3/4]
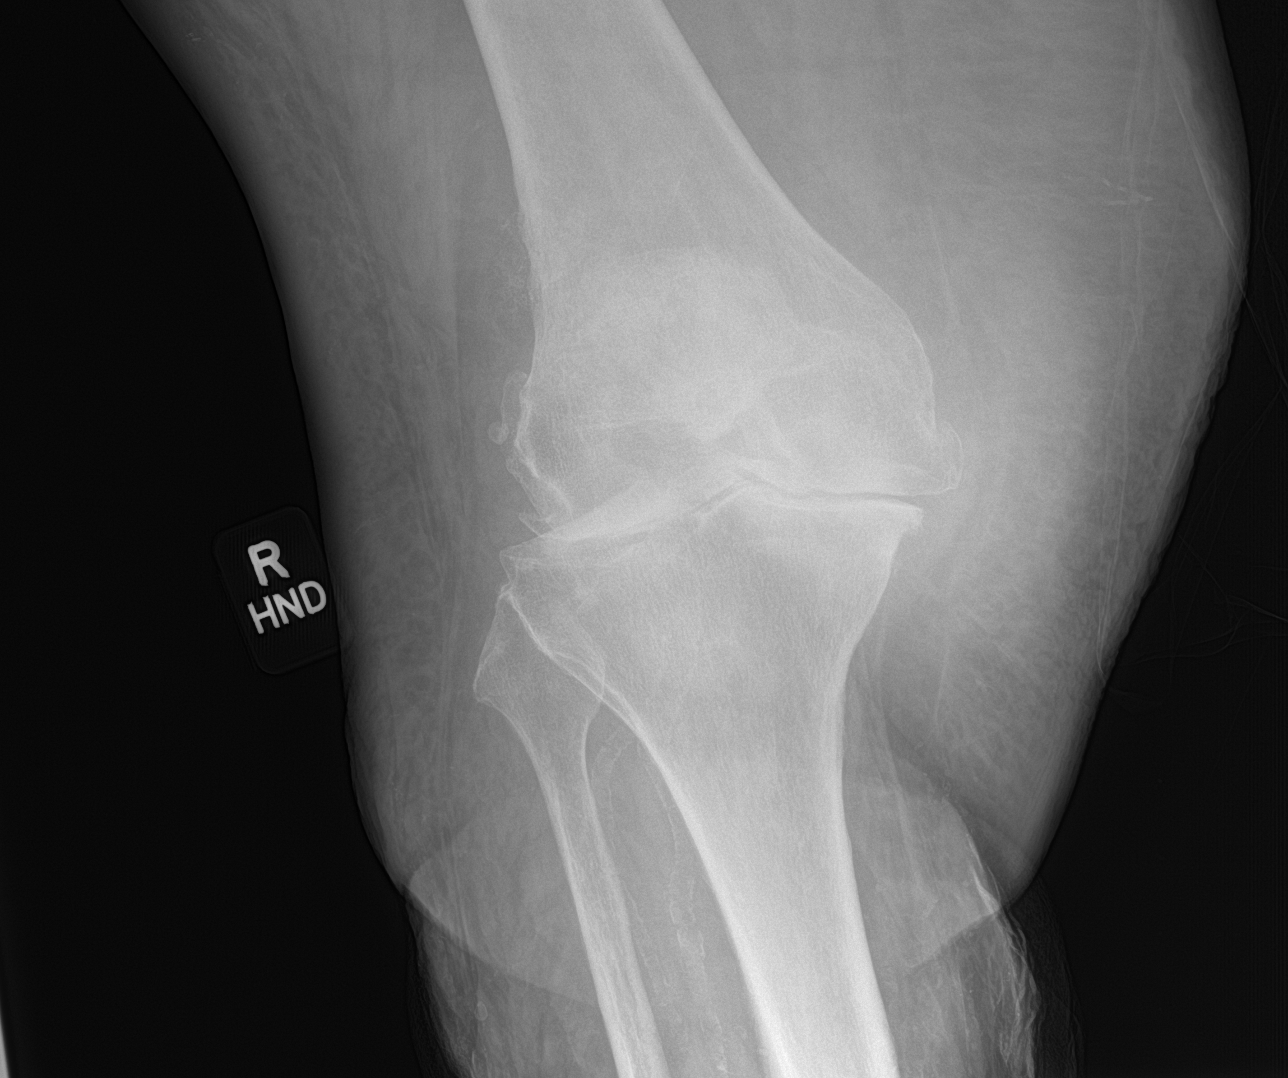
[im 4/4]
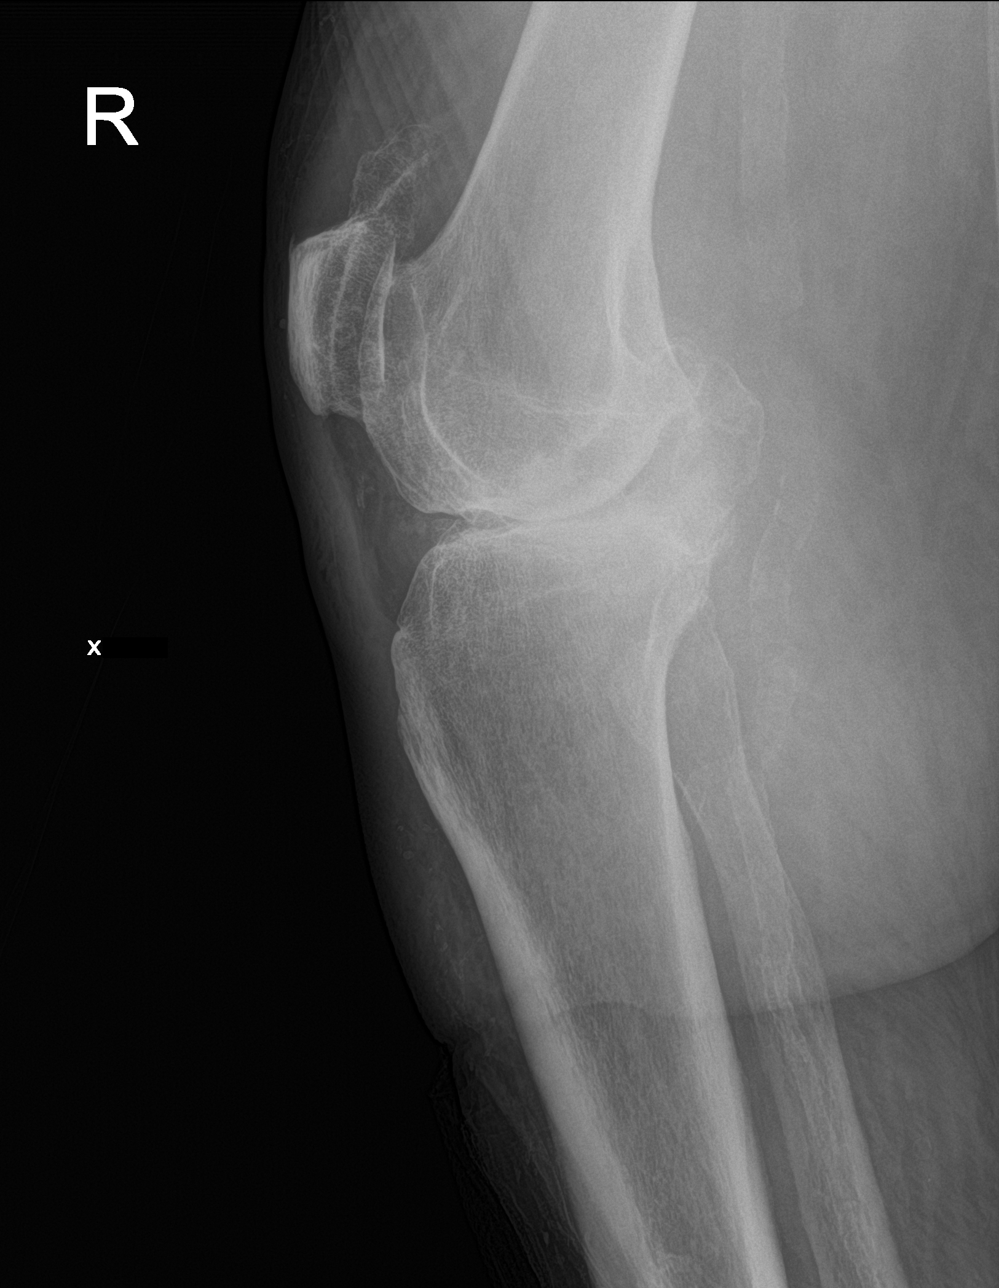

[4 of 4 positions shown; findings below may reference images not displayed]

FINDINGS: No recent fracture or dislocation is seen. Degenerative changes are
noted with joint space narrowing and bony spurs in the medial,
lateral and patellofemoral compartments. Arterial calcifications are
seen in the soft tissues. There is large smooth marginated calcific
density in the upper margin of patella possibly residual from
previous injury. There is no significant effusion.
IMPRESSION: No fracture is seen.  Severe degenerative changes are noted.

## 2024-01-05 ENCOUNTER — Ambulatory Visit: Payer: 59 | Admitting: Physician Assistant

## 2024-01-06 ENCOUNTER — Ambulatory Visit: Admitting: Gastroenterology

## 2024-01-06 NOTE — Progress Notes (Unsigned)
 Chief Complaint: Positive Cologuard test, flatulence Primary GI Doctor:Dr. Pyrtle  HPI:  Patient is a  73  year old male/male patient with past medical history of hypertension, atrial fibrillation, CHF, type 2 diabetes, chronic kidney disease IIIb, lymphedema with chronic wounds, who was referred to me by Alveria Apley, NP on 08/15/2023 for a complaint of positive Cologuard test, flatulence.    On 10/24/2023 patient was last seen by cardiology.  Patient's weight up today but no worsened shortness of breath or edema.  Asymptomatic in regards to atrial fibrillation.  Interval History      Patient presents for evaluation of Cologuard that was positive. Patient unaware of what this test means, we discussed having colonoscopy. He is unsure he would like to pursue a procedure he has to be put under or if he could tolerate bowel prep being in a wheel hair at a assisted living facility. Patient main complaint today is of gas over the last several months. He does admit he drinks two 12 oz sodas per day. He states if he cuts down on the carbonated drinks he does notice improvement with gas. He has bowel movement every other day. No blood in stool. No abdominal pain.Patient denies GERD or dysphagia.Patient denies nausea, vomiting, or weight loss. Nonsmoker. No alcohol. Patient on Eliquis 5 mg twice daily. He reports his colonoscopy was over ten years ago and thinks it was normal. Patient's family history includes sister with pancreatic CA at age 20.  Wt Readings from Last 3 Encounters:  01/07/24 (!) 318 lb (144.2 kg)  11/12/23 (!) 322 lb (146.1 kg)  10/24/23 (!) 322 lb 9.6 oz (146.3 kg)    Past Medical History:  Diagnosis Date   ALLERGIC RHINITIS 08/11/2006   Arthritis 2020   ASTHMA 08/11/2006   Bradycardia    Beta blocker d/c'd 2022   Chronic atrial fibrillation (HCC) 08/2014   Chronic combined systolic and diastolic CHF (congestive heart failure) [facility])    Cardiology f/u 12/2017: pt volume  overloaded (R heart dysf suspected), BNP very high, lasix increased.  Repeat echo 12/2017: normal LV EF, mild DD, +RV syst dysfxn, mod pulm HTN, biatrial enlgmt.   Chronic constipation    Chronic renal insufficiency, stage 3 (moderate) (HCC)    Colon cancer screening 02/2021   02/2021 Cologuard POS->GI ref   DIABETES MELLITUS, TYPE II 08/11/2006   Heart murmur 1990   AFib   HYPERTENSION 08/11/2006   IDA (iron deficiency anemia)    04/2022.  Iron started.  Needs hemoccults   Impaired mobility and endurance    MRSA infection 06/2018   LL venous stasis ulcer infected   Normocytic anemia 2016-2019   03/2018 B12 normal, iron ok (ferritin borderline low).   OBESITY, MORBID 12/14/2007   saxenda started 05/2020 by [facility] wt mgmt center   OSA (obstructive sleep apnea)    to get sleep study with [facility]) as of 12/03/2018 consult.   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  [id number] ID started him on amoxil prophylaxis---ineffective.  End 2018/Jan 2019 penicillin G injections prophyl helpful but pt declined to continue this as of 01/2018 ID f/u.  ID talked him into resuming monthly penicillin G as of 02/2018 f/u.     Restless leg syndrome    Rx'd clonazepam 09/2017 and pt refused to take it after reading the medication's potential side effects.   Venous stasis ulcers of both lower extremities (HCC)  Severe lymphedema.  wound clinic care ongoing as of 01/2018    Past Surgical History:  Procedure Laterality Date   Carotid dopplers  07/23/2018   Left NORMAL.  Right 1-39% ICA stenosis, with <50% distal CCA stenosis (not hemodynamically significant)   LE venous dopplers Bilateral 03/06/2022   no dvt   RIGHT HEART CATH N/A 02/19/2022   Marked volume overload, R>>L HF, preserved CO. Procedure: RIGHT HEART CATH;  Surgeon: Dolores Patty, MD;  Location: [facility] INVASIVE CV LAB;  Service: Cardiovascular;  Laterality: N/A;    TRANSTHORACIC ECHOCARDIOGRAM  11/2007; 09/2014; 11/2015;12/2017   LV fxn normal, EF normal, mild dilation of left atrium.  2015 grade II diast dysfxn.  2017 EF 55-60%. 2019: LVEF 60-65%, mild RV syst dysf,biatrial enlgmt, mod pulm htn. 02/14/22 EF 55-60%, unable to assess diast fxn, LVH, o/w normal   URETERAL STENT PLACEMENT     virtual colonoscopy  01/2011   Normal    Current Outpatient Medications  Medication Sig Dispense Refill   apixaban (ELIQUIS) 5 MG TABS tablet Take 5 mg by mouth 2 (two) times daily.     b complex vitamins capsule Take 1 capsule by mouth every morning.     Cholecalciferol (D-3-5) 125 MCG (5000 UT) capsule Take 5,000 Units by mouth daily.     diltiazem (TIAZAC) 120 MG 24 hr capsule Take 1 capsule (120 mg total) by mouth daily. 90 capsule 3   docusate sodium (COLACE) 100 MG capsule Take 100 mg by mouth 2 (two) times daily.     Dulaglutide (TRULICITY) 3 MG/0.5ML SOPN Inject 3 mg as directed once a week. 6 mL 3   Febuxostat 80 MG TABS Take 1 tablet (80 mg total) by mouth daily.     ferrous sulfate 325 (65 FE) MG tablet Take 325 mg by mouth daily with breakfast.     fluticasone (FLONASE) 50 MCG/ACT nasal spray Place 2 sprays into both nostrils daily. 16 g 6   glucose blood (ONETOUCH ULTRA) test strip CHECK BLOOD SUGAR TWICE  DAILY 200 strip 0   HYDROcodone-acetaminophen (NORCO/VICODIN) 5-325 MG tablet Take 1-2 tablets by mouth every 6 (six) hours as needed for moderate pain. 1-2 tabs po bid prn pain 5 tablet 0   HYDROcodone-acetaminophen (NORCO/VICODIN) 5-325 MG tablet Take 1 tablet by mouth every 6 (six) hours as needed for moderate pain (pain score 4-6).     Insulin Lispro Prot & Lispro (HUMALOG 75/25 MIX) (75-25) 100 UNIT/ML Kwikpen Inject 42 Units into the skin 2 (two) times daily.     Insulin Pen Needle (B-D ULTRAFINE III SHORT PEN) 31G X 8 MM MISC USE 1 PENNEEDLE TWICE DAILY 200 each 1   miconazole (MICOTIN) 2 % powder Apply 1 Application topically in the morning and at  bedtime. To abdominal fold.     Multiple Vitamin (MULTIVITAMIN) tablet Take 1 tablet by mouth daily. 30 tablet 0   potassium chloride SA (KLOR-CON M20) 20 MEQ tablet Take one tablet three times per week. (Patient taking differently: Take 20 mEq by mouth every Monday, Wednesday, and Friday. Take one tablet three times per week.) 45 tablet 2   terazosin (HYTRIN) 10 MG capsule Take 10 mg by mouth at bedtime.     testosterone cypionate (DEPOTESTOSTERONE CYPIONATE) 200 MG/ML injection Inject 0.5 mLs into the muscle once a week. On Friday.     torsemide (DEMADEX) 20 MG tablet Take 40 mg by mouth 2 (two) times daily.     traMADol (ULTRAM) 50 MG tablet Take 1  tablet (50 mg total) by mouth in the morning, at noon, and at bedtime. 10 tablet 0   AMBULATORY NON FORMULARY MEDICATION In and out catheterization to obtain sterile urine specimen for urine microscopy and urine culture. Please fax results to [facility] [place], attn: Evette Georges, NP no later than Wednesday 11/26/2023. Fax # [phone]. 1 each PRN   cetirizine (ZYRTEC) 10 MG tablet Take 10 mg by mouth at bedtime.     Current Facility-Administered Medications  Medication Dose Route Frequency Provider Last Rate Last Admin   bupivacaine (MARCAINE) 0.5 % (with pres) injection 3 mL  3 mL Other Once Tyrell Antonio, MD        Allergies as of 01/07/2024 - Review Complete 01/07/2024  Allergen Reaction Noted   Other Other (See Comments) 10/23/2018   Hydralazine Other (See Comments) 04/27/2021    Family History  Problem Relation Age of Onset   Diabetes Mother    Hearing loss Father    Dementia Sister    Pancreatic cancer Sister    Cancer Maternal Grandmother    Review of Systems:    Constitutional: No weight loss, fever, chills, weakness or fatigue HEENT: Eyes: No change in vision               Ears, Nose, Throat:  No change in hearing or congestion Skin: No rash or itching Cardiovascular: No chest pain, chest pressure or  palpitations   Respiratory: No SOB or cough Gastrointestinal: See HPI and otherwise negative Genitourinary: No dysuria or change in urinary frequency Neurological: No headache, dizziness or syncope Musculoskeletal: No new muscle or joint pain Hematologic: No bleeding or bruising Psychiatric: No history of depression or anxiety   Physical Exam:  Vital signs: BP 110/70   Pulse 61   Ht 5\' 10"  (1.778 m)   Wt (!) 318 lb (144.2 kg)   BMI 45.63 kg/m   Constitutional:   Pleasant Caucasian male appears to be in NAD, Well developed, Well nourished, alert and cooperative Throat: Oral cavity and pharynx without inflammation, swelling or lesion.  Respiratory: Respirations even and unlabored. Lungs wheezing bilaterally.   No wheezes, crackles, or rhonchi.  Cardiovascular: Normal S1, S2. Regular rate and rhythm. No cyanosis or pallor.  Gastrointestinal:  Soft, nondistended, nontender. Obese. No rebound or guarding. Normal bowel sounds. No appreciable masses or hepatomegaly. Rectal:  Not performed.  Msk:  Symmetrical without gross deformities. lymphedema, noted bilateral legs with wraps. Neurologic:  Alert and  oriented x4;  grossly normal neurologically. In wheelchair. Skin:   Dry and intact without significant lesions or rashes. Psychiatric: Oriented to person, place and time. Demonstrates good judgement and reason without abnormal affect or behaviors.  RELEVANT LABS AND IMAGING: CBC    Latest Ref Rng & Units 10/24/2023   11:27 AM 06/26/2023   12:51 PM 12/23/2022   10:43 AM  CBC  WBC 3.4 - 10.8 x10E3/uL 8.6  9.9  9.0   Hemoglobin 13.0 - 17.7 g/dL 62.1  30.8  65.7   Hematocrit 37.5 - 51.0 % 55.9  51.3  43.5   Platelets 150 - 450 x10E3/uL 218  259  268      CMP     Latest Ref Rng & Units 10/24/2023   11:27 AM 06/30/2023    5:55 AM 06/29/2023    5:08 AM  CMP  Glucose 70 - 99 mg/dL 846  962  952   BUN 8 - 27 mg/dL 26  43  52   Creatinine 0.76 - 1.27  mg/dL 4.09  8.11  9.14   Sodium 134  - 144 mmol/L 139  132  132   Potassium 3.5 - 5.2 mmol/L 5.2  3.8  3.4   Chloride 96 - 106 mmol/L 98  94  92   CO2 20 - 29 mmol/L 24  31  30    Calcium 8.6 - 10.2 mg/dL 8.8  8.2  8.2      Lab Results  Component Value Date   TSH 5.86 (H) 04/11/2022  02/26/21 cologuard positive 02/14/22 echo- Left ventricular ejection fraction, by estimation, is 55 to 60%.  07/13/2022 CT renal stone study IMPRESSION: 1. Mild retroperitoneal, iliac chain and inguinal lymphadenopathy of uncertain etiology. Recommend clinical correlation and follow-up. 2. Subcutaneous stranding and skin thickening of the pannus. Correlate clinically for cellulitis. 3. Cholelithiasis. 4. Mild splenomegaly. 5. Sigmoid colon diverticulosis. 6. Old granulomatous disease. 7.  Aortic Atherosclerosis (ICD10-I70.0).   Assessment: Encounter Diagnoses  Name Primary?   Flatulence Yes   Positive colorectal cancer screening using Cologuard test      73 year old male that presents for evaluation for positive cologuard from 2022 per records. We discussed what the colonoscopy entails and he is not sure he would like to pursue it due to his overall health and lack of transportation to/from hospital. He will think about it and let our office know. He is aware and verbalizes understanding that the positive cologuard indicates he Kortney Potvin have precancerous polyps that potentially could turn into colon CA.   His only complaint today is of flatulence. After discussing his diet he does consume a lot of foods and carbonated beverages that I suspect cause most of his gas with addition to his limited mobility. I will provide handout on foods he can try to eliminate and he can use OTC simethicone or Gas-X if needed.  Plan: -Patient undecided on colonoscopy, would need to be done at hospital (due to BMI). He will consider and get back with [place]. He would need transport with Assisted living.  -low fodmap diet handout provided  - OTC Gas-X as needed  Thank you  for the courtesy of this consult. Please call me with any questions or concerns.   Dondra Rhett, FNP-C [facility] 01/07/2024, 9:45 AM  Cc: Alveria Apley, NP

## 2024-01-07 ENCOUNTER — Ambulatory Visit: Admitting: Gastroenterology

## 2024-01-07 ENCOUNTER — Encounter: Payer: Self-pay | Admitting: Gastroenterology

## 2024-01-07 VITALS — BP 110/70 | HR 61 | Ht 70.0 in | Wt 318.0 lb

## 2024-01-07 DIAGNOSIS — R195 Other fecal abnormalities: Secondary | ICD-10-CM | POA: Diagnosis not present

## 2024-01-07 DIAGNOSIS — R143 Flatulence: Secondary | ICD-10-CM | POA: Diagnosis not present

## 2024-01-07 NOTE — Patient Instructions (Signed)
 Over the counter Gas-X or simethicone as needed for gas Cut out carbonated drinks Provide handout on foods that can cause gas.  Please call office when you have decided if you would like to proceed with colonoscopy in the hospital. Remember you will need a ride to and from the procedure.

## 2024-01-08 NOTE — Progress Notes (Signed)
 Addendum: Reviewed and agree with assessment and management plan. Asha Grumbine, Carie Caddy, MD

## 2024-01-12 ENCOUNTER — Ambulatory Visit: Payer: 59 | Attending: Internal Medicine | Admitting: Internal Medicine

## 2024-01-12 ENCOUNTER — Encounter: Payer: Self-pay | Admitting: Internal Medicine

## 2024-01-12 VITALS — BP 139/79 | HR 59 | Resp 16 | Ht 70.0 in

## 2024-01-12 DIAGNOSIS — M1A39X0 Chronic gout due to renal impairment, multiple sites, without tophus (tophi): Secondary | ICD-10-CM | POA: Diagnosis not present

## 2024-01-12 DIAGNOSIS — E1165 Type 2 diabetes mellitus with hyperglycemia: Secondary | ICD-10-CM

## 2024-01-12 DIAGNOSIS — M1711 Unilateral primary osteoarthritis, right knee: Secondary | ICD-10-CM

## 2024-01-12 DIAGNOSIS — M7541 Impingement syndrome of right shoulder: Secondary | ICD-10-CM | POA: Diagnosis not present

## 2024-01-12 DIAGNOSIS — Z794 Long term (current) use of insulin: Secondary | ICD-10-CM

## 2024-01-12 NOTE — Progress Notes (Deleted)
 Office Visit Note  Patient: Doron Shake Gorniak             Date of Birth: 01-09-1951           MRN: [id number]             PCP: de [place], Raymond J, MD Referring: Charlynne Pander, MD Visit Date: 01/12/2024   Subjective:  Follow-up (Patient states if he does get labs done today, his other doctor would like his A1C checked and the patient would like his uric acid level checked. )   History of Present Illness: Harlem Bula is a 73 y.o. male here for follow up on his Gout. Since his last visit, he denies having any flares, although the pain in his right wrist and left DIP joint on the second digit of his hand are still tender. He has been taking his febuxostat as prescribed.   He has seen Ortho for his shoulder and right knee osteoarthritis and will be receiving HA acid injections. However, he feels that his legs lack strength when trying to stand up. He is wheelchair bound. Has received injections on his knee but have not provided much relief. Notes that he did physical therapy and found that beneficial. For his right shoulder, he has limited range of motion. Has been hesitant to ask the aide to help him with warm compresses, but found these helpful. Has been doing physical therapy also found it beneficial.    Previous HPI 08/19/23 Antoin Dargis Tayloe is a 73 y.o. male here for evaluation of joint pain and swelling and erythema especially with recent episode of severe left hand and wrist swelling but also has some persistent symptoms with pain in the right wrist and chronic swelling in both lower legs.  Multiple comorbidities including congestive heart failure, hypertension, A-fib, venous insufficiency, asthma, type 2 diabetes with associated neuropathy, lymphedema, and chronic kidney disease.  He did not recall a history of gout until this in the past year he feels symptoms have been much worse since moving to his current living facility setting.  This has also been accompanied by adjustments for  diuretic medications and some exacerbation of chronic kidney disease.  He saw Dr. Jean Rosenthal in sports medicine clinic in March noting hyperuricemia since at least June 2023 not on any urate lowering therapy did improve with short course of oral NSAIDs.  He has suffered a severe pain and swelling in his left hand in February at the wrist subsequently some swelling and right hand PIP joints more recently has persistent trouble with right wrist pain describes a pinching type of sensation occurs usually provoked after use.   07/2023 eGFR 16  10/2023 36   01/2023 Uric acid 13.7  04/01/2023 Uric acid 8.2  Review of Systems  Constitutional:  Negative for fatigue.  HENT:  Negative for mouth sores.   Eyes:  Negative for dryness.  Respiratory:  Positive for difficulty breathing ((has recently had the flu)). Negative for shortness of breath.   Cardiovascular:  Negative for chest pain and palpitations.  Gastrointestinal:  Negative for blood in stool, constipation and diarrhea.  Endocrine: Negative for increased urination.  Genitourinary:  Negative for involuntary urination.  Musculoskeletal:  Positive for joint pain, gait problem, joint pain, joint swelling, muscle weakness and muscle tenderness. Negative for myalgias, morning stiffness and myalgias.  Skin:  Negative for color change, rash, hair loss and sensitivity to sunlight.  Allergic/Immunologic: Negative for susceptible to infections.  Neurological:  Positive for dizziness. Negative  for headaches.  Hematological:  Negative for swollen glands.  Psychiatric/Behavioral:  Positive for depressed mood. Negative for sleep disturbance. The patient is not nervous/anxious.     PMFS History:  Patient Active Problem List   Diagnosis Date Noted   CKD (chronic kidney disease) 11/28/2023   Urinary frequency 09/17/2023   Pain in right wrist 08/19/2023   Chronic gout due to renal impairment 08/19/2023   Onychomycosis 08/19/2023   Hypokalemia 06/26/2023    Primary osteoarthritis of right knee 06/04/2022   Primary osteoarthritis of left knee 06/04/2022   Body mass index 45.0-49.9, adult (HCC) 06/04/2022   Hyponatremia 04/23/2022   Pressure injury of skin 03/07/2022   Obesity, Class III, BMI 40-49.9 (morbid obesity) (HCC)    Uncontrolled type 2 diabetes mellitus with hyperglycemia (HCC) 03/04/2018   Chronic diastolic CHF (congestive heart failure) (HCC) 12/24/2016   Lymphedema 02/22/2016   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   Chronic headaches 05/10/2015   Persistent atrial fibrillation (HCC) 08/15/2014   Type 2 diabetes mellitus with diabetic neuropathy (HCC) 08/15/2014   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   Recurrent cellulitis of lower extremity 08/02/2013   Lymphedema of both lower extremities 11/04/2008   Morbid obesity due to excess calories (HCC) 12/14/2007   Venous (peripheral) insufficiency 07/22/2007   Uncontrolled type 2 diabetes mellitus with hyperglycemia, with long-term current use of insulin (HCC) 08/11/2006   Hypertension, essential 08/11/2006   ALLERGIC RHINITIS 08/11/2006   Asthma 08/11/2006    Past Medical History:  Diagnosis Date   ALLERGIC RHINITIS 08/11/2006   Arthritis 2020   ASTHMA 08/11/2006   Bradycardia    Beta blocker d/c'd 2022   Chronic atrial fibrillation (HCC) 08/2014   Chronic combined systolic and diastolic CHF (congestive heart failure) [facility])    Cardiology f/u 12/2017: pt volume overloaded (R heart dysf suspected), BNP very high, lasix increased.  Repeat echo 12/2017: normal LV EF, mild DD, +RV syst dysfxn, mod pulm HTN, biatrial enlgmt.   Chronic constipation    Chronic renal insufficiency, stage 3 (moderate) (HCC)    Colon cancer screening 02/2021   02/2021 Cologuard POS->GI ref   DIABETES MELLITUS, TYPE II 08/11/2006   Heart murmur 1990   AFib   HYPERTENSION 08/11/2006   IDA (iron deficiency anemia)    04/2022.  Iron started.  Needs hemoccults   Impaired mobility and endurance     MRSA infection 06/2018   LL venous stasis ulcer infected   Normocytic anemia 2016-2019   03/2018 B12 normal, iron ok (ferritin borderline low).   OBESITY, MORBID 12/14/2007   saxenda started 05/2020 by [facility] wt mgmt center   OSA (obstructive sleep apnea)    to get sleep study with [facility]) as of 12/03/2018 consult.   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  [id number] ID started him on amoxil prophylaxis---ineffective.  End 2018/Jan 2019 penicillin G injections prophyl helpful but pt declined to continue this as of 01/2018 ID f/u.  ID talked him into resuming monthly penicillin G as of 02/2018 f/u.     Restless leg syndrome    Rx'd clonazepam 09/2017 and pt refused to take it after reading the medication's potential side effects.   Venous stasis ulcers of both lower extremities (HCC)    Severe lymphedema.  wound clinic care ongoing as of 01/2018    Family History  Problem Relation Age of Onset   Diabetes Mother    Hearing loss Father  Dementia Sister    Pancreatic cancer Sister    Cancer Maternal Grandmother    Past Surgical History:  Procedure Laterality Date   Carotid dopplers  07/23/2018   Left NORMAL.  Right 1-39% ICA stenosis, with <50% distal CCA stenosis (not hemodynamically significant)   LE venous dopplers Bilateral 03/06/2022   no dvt   RIGHT HEART CATH N/A 02/19/2022   Marked volume overload, R>>L HF, preserved CO. Procedure: RIGHT HEART CATH;  Surgeon: Dolores Patty, MD;  Location: [facility] INVASIVE CV LAB;  Service: Cardiovascular;  Laterality: N/A;   TRANSTHORACIC ECHOCARDIOGRAM  11/2007; 09/2014; 11/2015;12/2017   LV fxn normal, EF normal, mild dilation of left atrium.  2015 grade II diast dysfxn.  2017 EF 55-60%. 2019: LVEF 60-65%, mild RV syst dysf,biatrial enlgmt, mod pulm htn. 02/14/22 EF 55-60%, unable to assess diast fxn, LVH, o/w normal   URETERAL STENT PLACEMENT     virtual  colonoscopy  01/2011   Normal   Social History   Social History Narrative         Immunization History  Administered Date(s) Administered   Fluad Quad(high Dose 65+) 07/06/2019, 07/12/2020, 07/11/2021, 07/10/2022   Fluad Trivalent(High Dose 65+) 08/04/2023   Influenza Split 08/19/2011, 08/11/2012   Influenza Whole 08/11/2006, 08/17/2008, 08/08/2009, 09/17/2010   Influenza, High Dose Seasonal PF 08/05/2016, 09/08/2018   Influenza,inj,Quad PF,6+ Mos 07/26/2013, 09/11/2015, 09/15/2017   PFIZER(Purple Top)SARS-COV-2 Vaccination 12/14/2019, 01/10/2020, 09/15/2020   Pneumococcal Conjugate-13 08/05/2016   Pneumococcal Polysaccharide-23 04/22/2011, 12/08/2017   Td 04/10/2009   Zoster, Live 04/22/2011     Objective: Vital Signs: BP 139/79 (BP Location: Left Arm, Patient Position: Sitting, Cuff Size: Normal)   Pulse (!) 59   Resp 16   Ht 5\' 10"  (1.778 m)   BMI 45.63 kg/m    Physical Exam Constitutional:      General: He is not in acute distress.    Appearance: He is not ill-appearing.  HENT:     Head: Normocephalic.     Nose: No congestion or rhinorrhea.     Mouth/Throat:     Mouth: Mucous membranes are dry.  Eyes:     Extraocular Movements: Extraocular movements intact.     Conjunctiva/sclera: Conjunctivae normal.  Cardiovascular:     Rate and Rhythm: Normal rate and regular rhythm.     Heart sounds: No murmur heard.    No friction rub. No gallop.  Pulmonary:     Effort: No respiratory distress.     Breath sounds: No wheezing, rhonchi or rales.  Abdominal:     Palpations: Abdomen is soft.  Musculoskeletal:     Right lower leg: Edema present.     Left lower leg: Edema present.     Comments: Tenderness to palpation over the right dorsal wrist. Limited range of motion on extension with radiating pain up the forearm. Erythematous DIP joint over the second digit on the left hand. Tenderness over the IT band and decreased extension over the right medial and dorsal knee.  Preserved range of motion on the left shoulder. Limited external rotation of elevation up to 90 degrees of the right shoulder.   Skin:    General: Skin is warm and dry.     Capillary Refill: Capillary refill takes less than 2 seconds.  Neurological:     Mental Status: He is alert and oriented to person, place, and time.     Imaging:  XR on the LEFT: 11/12/2023 Mild glenohumeral and acromioclavicular osteoarthritis.   Recent Labs: Lab Results  Component Value Date   WBC 8.6 10/24/2023   HGB 17.9 (H) 10/24/2023   PLT 218 10/24/2023   NA 139 10/24/2023   K 5.2 10/24/2023   CL 98 10/24/2023   CO2 24 10/24/2023   GLUCOSE 173 (H) 10/24/2023   BUN 26 10/24/2023   CREATININE 1.96 (H) 10/24/2023   BILITOT 0.6 12/23/2022   ALKPHOS 119 12/23/2022   AST 20 12/23/2022   ALT 11 12/23/2022   PROT 6.4 12/23/2022   ALBUMIN 2.9 (L) 06/27/2023   CALCIUM 8.8 10/24/2023   GFRAA 63 02/09/2020    Speciality Comments: No specialty comments available.  Procedures:  Joint Injection/Arthrocentesis  Date/Time: 01/12/2024 4:39 PM  Performed by: Fuller Plan, MD Authorized by: Fuller Plan, MD  Indications: pain  Body area: shoulder Joint: right subacromial bursa Local anesthesia used: yes  Anesthesia: Local anesthesia used: yes   Details: 27 G 1.5 in needle, lateral approach Medications: 2 mL lidocaine 1 %; 40 mg triamcinolone acetonide 40 MG/ML Outcome: tolerated well, no immediate complications Procedure, treatment alternatives, risks and benefits explained, specific risks discussed. Consent was given by the patient. Immediately prior to procedure a time out was called to verify the correct patient, procedure, equipment, support staff and site/side marked as required. Patient was prepped and draped in the usual sterile fashion.    Allergies: Other and Hydralazine   Assessment / Plan:     Visit Diagnoses:   Chronic gout due to renal impairment of multiple sites without  tophus Also on diuretics for his CHF. Currenlty on Febuxostat 80mg . Last uric acid was 8.2 on 03/2023. Has not had a repeat uric acid since he has been on this dose. Does have a tender DIP on his left second digit. Unlikely to control gout only on PO meds, but he has not had any acute flares at this time.  -Increase Febuxostat to 120mg  daily  -Uric acid, CMP, and sed rate  Uncontrolled type 2 diabetes mellitus with hyperglycemia (HCC)  Last HgbA1C was 8.1 on 06/2023. Managed by PCP. Will measure today.  -Hemoglobin A1c  Primary osteoarthritis of right knee  Has limited extension secondary to decreased relaxation. Palpable tense muscle over the lateral and dorsal knee. Likely limiting his ability to stand, and explains why he found benefit with physical therapy. Will get HA injections by ortho. Will send order for PT 2 times weekly for six weeks.  -Ambulatory referral to Physical Therapy   Impingement syndrome of right shoulder  Has limited ROM and can only elevate his arm up to 90 degrees. External rotation is limited 2/2 pain as well. S/p steroid injection to the joint today. Has not tried much at home besides heating pads which he now has stopped.  -Tylenol 500-1000mg  q6hrs as needed for pain  -Lidocaine 4% patches daily PRN  -Shoulder exercises with stretch band    Orders: Orders Placed This Encounter  Procedures   Large Joint Inj: R subacromial bursa   Joint Injection/Arthrocentesis   Sedimentation rate   Uric acid   COMPLETE METABOLIC PANEL WITH GFR   Hemoglobin A1c   Ambulatory referral to Physical Therapy   No orders of the defined types were placed in this encounter.  Follow-Up Instructions: Return in about 3 months (around 04/13/2024) for OA/gout uloric/inj f/u 3mos.   Manuela Neptune, MD

## 2024-01-12 NOTE — Progress Notes (Unsigned)
 Office Visit Note  Patient: [name]             Date of Birth: 1951/01/08           MRN: [id number]             PCP: de [place], Raymond J, MD Referring: Charlynne Pander, MD Visit Date: 01/12/2024   Subjective:  Follow-up (Patient states if he does get labs done today, his other doctor would like his A1C checked and the patient would like his uric acid level checked. )   History of Present Illness: [name] is a 73 y.o. male here for follow up ***     Previous HPI 08/19/23 [name] is a 73 y.o. male here for evaluation of joint pain and swelling and erythema especially with recent episode of severe left hand and wrist swelling but also has some persistent symptoms with pain in the right wrist and chronic swelling in both lower legs.  Multiple comorbidities including congestive heart failure, hypertension, A-fib, venous insufficiency, asthma, type 2 diabetes with associated neuropathy, lymphedema, and chronic kidney disease.  He did not recall a history of gout until this in the past year he feels symptoms have been much worse since moving to his current living facility setting.  This has also been accompanied by adjustments for diuretic medications and some exacerbation of chronic kidney disease.  He saw Dr. Jean Rosenthal in sports medicine clinic in March noting hyperuricemia since at least June 2023 not on any urate lowering therapy did improve with short course of oral NSAIDs.  He has suffered a severe pain and swelling in his left hand in February at the wrist subsequently some swelling and right hand PIP joints more recently has persistent trouble with right wrist pain describes a pinching type of sensation occurs usually provoked after use.   07/2023 eGFR 16   01/2023 Uric acid 13.7   Review of Systems  Constitutional:  Negative for fatigue.  HENT:  Positive for mouth dryness. Negative for mouth sores.   Eyes:  Negative for dryness.  Respiratory:  Negative for  shortness of breath.   Cardiovascular:  Negative for chest pain and palpitations.  Gastrointestinal:  Negative for blood in stool, constipation and diarrhea.  Endocrine: Negative for increased urination.  Genitourinary:  Negative for involuntary urination.  Musculoskeletal:  Positive for muscle weakness and muscle tenderness. Negative for joint pain, gait problem, joint pain, joint swelling, myalgias, morning stiffness and myalgias.  Skin:  Negative for color change, rash, hair loss and sensitivity to sunlight.  Allergic/Immunologic: Negative for susceptible to infections.  Neurological:  Positive for dizziness. Negative for headaches.  Hematological:  Negative for swollen glands.  Psychiatric/Behavioral:  Positive for depressed mood. Negative for sleep disturbance. The patient is not nervous/anxious.     PMFS History:  Patient Active Problem List   Diagnosis Date Noted   CKD (chronic kidney disease) 11/28/2023   Urinary frequency 09/17/2023   Pain in right wrist 08/19/2023   Chronic gout due to renal impairment 08/19/2023   Onychomycosis 08/19/2023   Hypokalemia 06/26/2023   Primary osteoarthritis of right knee 06/04/2022   Primary osteoarthritis of left knee 06/04/2022   Body mass index 45.0-49.9, adult (HCC) 06/04/2022   Hyponatremia 04/23/2022   Pressure injury of skin 03/07/2022   Obesity, Class III, BMI 40-49.9 (morbid obesity) (HCC)    Uncontrolled type 2 diabetes mellitus with hyperglycemia (HCC) 03/04/2018   Chronic diastolic CHF (congestive heart failure) (HCC) 12/24/2016  Lymphedema 02/22/2016   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   Chronic headaches 05/10/2015   Persistent atrial fibrillation (HCC) 08/15/2014   Type 2 diabetes mellitus with diabetic neuropathy (HCC) 08/15/2014   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   Recurrent cellulitis of lower extremity 08/02/2013   Lymphedema of both lower extremities 11/04/2008   Morbid obesity due to excess  calories (HCC) 12/14/2007   Venous (peripheral) insufficiency 07/22/2007   Uncontrolled type 2 diabetes mellitus with hyperglycemia, with long-term current use of insulin (HCC) 08/11/2006   Hypertension, essential 08/11/2006   ALLERGIC RHINITIS 08/11/2006   Asthma 08/11/2006    Past Medical History:  Diagnosis Date   ALLERGIC RHINITIS 08/11/2006   Arthritis 2020   ASTHMA 08/11/2006   Bradycardia    Beta blocker d/c'd 2022   Chronic atrial fibrillation (HCC) 08/2014   Chronic combined systolic and diastolic CHF (congestive heart failure) [facility])    Cardiology f/u 12/2017: pt volume overloaded (R heart dysf suspected), BNP very high, lasix increased.  Repeat echo 12/2017: normal LV EF, mild DD, +RV syst dysfxn, mod pulm HTN, biatrial enlgmt.   Chronic constipation    Chronic renal insufficiency, stage 3 (moderate) (HCC)    Colon cancer screening 02/2021   02/2021 Cologuard POS->GI ref   DIABETES MELLITUS, TYPE II 08/11/2006   Heart murmur 1990   AFib   HYPERTENSION 08/11/2006   IDA (iron deficiency anemia)    04/2022.  Iron started.  Needs hemoccults   Impaired mobility and endurance    MRSA infection 06/2018   LL venous stasis ulcer infected   Normocytic anemia 2016-2019   03/2018 B12 normal, iron ok (ferritin borderline low).   OBESITY, MORBID 12/14/2007   saxenda started 05/2020 by [facility] wt mgmt center   OSA (obstructive sleep apnea)    to get sleep study with [facility]) as of 12/03/2018 consult.   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  92018 ID started him on amoxil prophylaxis---ineffective.  End 2018/Jan 2019 penicillin G injections prophyl helpful but pt declined to continue this as of 01/2018 ID f/u.  ID talked him into resuming monthly penicillin G as of 02/2018 f/u.     Restless leg syndrome    Rx'd clonazepam 09/2017 and pt refused to take it after reading the medication's potential side  effects.   Venous stasis ulcers of both lower extremities (HCC)    Severe lymphedema.  wound clinic care ongoing as of 01/2018    Family History  Problem Relation Age of Onset   Diabetes Mother    Hearing loss Father    Dementia Sister    Pancreatic cancer Sister    Cancer Maternal Grandmother    Past Surgical History:  Procedure Laterality Date   Carotid dopplers  07/23/2018   Left NORMAL.  Right 1-39% ICA stenosis, with <50% distal CCA stenosis (not hemodynamically significant)   LE venous dopplers Bilateral 03/06/2022   no dvt   RIGHT HEART CATH N/A 02/19/2022   Marked volume overload, R>>L HF, preserved CO. Procedure: RIGHT HEART CATH;  Surgeon: Dolores Patty, MD;  Location: [facility] INVASIVE CV LAB;  Service: Cardiovascular;  Laterality: N/A;   TRANSTHORACIC ECHOCARDIOGRAM  11/2007; 09/2014; 11/2015;12/2017   LV fxn normal, EF normal, mild dilation of left atrium.  2015 grade II diast dysfxn.  2017 EF 55-60%. 2019: LVEF 60-65%, mild RV syst dysf,biatrial enlgmt, mod pulm htn. 02/14/22 EF 55-60%, unable to assess diast fxn, LVH, o/w  normal   URETERAL STENT PLACEMENT     virtual colonoscopy  01/2011   Normal   Social History   Social History Narrative         Immunization History  Administered Date(s) Administered   Fluad Quad(high Dose 65+) 07/06/2019, 07/12/2020, 07/11/2021, 07/10/2022   Fluad Trivalent(High Dose 65+) 08/04/2023   Influenza Split 08/19/2011, 08/11/2012   Influenza Whole 08/11/2006, 08/17/2008, 08/08/2009, 09/17/2010   Influenza, High Dose Seasonal PF 08/05/2016, 09/08/2018   Influenza,inj,Quad PF,6+ Mos 07/26/2013, 09/11/2015, 09/15/2017   PFIZER(Purple Top)SARS-COV-2 Vaccination 12/14/2019, 01/10/2020, 09/15/2020   Pneumococcal Conjugate-13 08/05/2016   Pneumococcal Polysaccharide-23 04/22/2011, 12/08/2017   Td 04/10/2009   Zoster, Live 04/22/2011     Objective: Vital Signs: BP 139/79 (BP Location: Left Arm, Patient Position: Sitting, Cuff Size:  Normal)   Pulse (!) 59   Resp 16   Ht 5\' 10"  (1.778 m)   BMI 45.63 kg/m    Physical Exam   Musculoskeletal Exam: ***  CDAI Exam: CDAI Score: -- Patient Global: --; Provider Global: -- Swollen: --; Tender: -- Joint Exam 01/12/2024   No joint exam has been documented for this visit   There is currently no information documented on the homunculus. Go to the Rheumatology activity and complete the homunculus joint exam.  Investigation: No additional findings.  Imaging: No results found.  Recent Labs: Lab Results  Component Value Date   WBC 8.6 10/24/2023   HGB 17.9 (H) 10/24/2023   PLT 218 10/24/2023   NA 139 10/24/2023   K 5.2 10/24/2023   CL 98 10/24/2023   CO2 24 10/24/2023   GLUCOSE 173 (H) 10/24/2023   BUN 26 10/24/2023   CREATININE 1.96 (H) 10/24/2023   BILITOT 0.6 12/23/2022   ALKPHOS 119 12/23/2022   AST 20 12/23/2022   ALT 11 12/23/2022   PROT 6.4 12/23/2022   ALBUMIN 2.9 (L) 06/27/2023   CALCIUM 8.8 10/24/2023   GFRAA 63 02/09/2020    Speciality Comments: No specialty comments available.  Procedures:  Large Joint Inj: R subacromial bursa on 01/12/2024 4:12 PM Indications: pain Details: 27 G 1.5 in needle, lateral approach Medications: 2 mL lidocaine 1 %; 40 mg triamcinolone acetonide 40 MG/ML Outcome: tolerated well, no immediate complications Procedure, treatment alternatives, risks and benefits explained, specific risks discussed. Consent was given by the patient. Immediately prior to procedure a time out was called to verify the correct patient, procedure, equipment, support staff and site/side marked as required. Patient was prepped and draped in the usual sterile fashion.     Allergies: Other and Hydralazine   Assessment / Plan:     Visit Diagnoses: Chronic gout due to renal impairment of multiple sites without tophus - Plan: Sedimentation rate, Uric acid, COMPLETE METABOLIC PANEL WITH GFR  Uncontrolled type 2 diabetes mellitus with  hyperglycemia, with long-term current use of insulin (HCC)  Uncontrolled type 2 diabetes mellitus with hyperglycemia (HCC) - Plan: Hemoglobin A1c  Primary osteoarthritis of right knee - Plan: Ambulatory referral to Physical Therapy  ***  Orders: Orders Placed This Encounter  Procedures   Large Joint Inj   Sedimentation rate   Uric acid   COMPLETE METABOLIC PANEL WITH GFR   Hemoglobin A1c   Ambulatory referral to Physical Therapy   No orders of the defined types were placed in this encounter.    Follow-Up Instructions: No follow-ups on file.   Fuller Plan, MD  Note - This record has been created using [employer].  Chart creation errors have been  sought, but may not always  have been located. Such creation errors do not reflect on  the standard of medical care.

## 2024-01-12 NOTE — Assessment & Plan Note (Signed)
 Last A1C was 8.4% on 06/2023. Managed by PCP. Requesting A1C as he is getting labs today. Will repeat today.

## 2024-01-13 LAB — COMPLETE METABOLIC PANEL WITH GFR
AG Ratio: 1.1 (calc) (ref 1.0–2.5)
ALT: 16 U/L (ref 9–46)
AST: 18 U/L (ref 10–35)
Albumin: 3.8 g/dL (ref 3.6–5.1)
Alkaline phosphatase (APISO): 97 U/L (ref 35–144)
BUN/Creatinine Ratio: 15 (calc) (ref 6–22)
BUN: 31 mg/dL — ABNORMAL HIGH (ref 7–25)
CO2: 34 mmol/L — ABNORMAL HIGH (ref 20–32)
Calcium: 9.1 mg/dL (ref 8.6–10.3)
Chloride: 96 mmol/L — ABNORMAL LOW (ref 98–110)
Creat: 2.12 mg/dL — ABNORMAL HIGH (ref 0.70–1.28)
Globulin: 3.4 g/dL (ref 1.9–3.7)
Glucose, Bld: 123 mg/dL — ABNORMAL HIGH (ref 65–99)
Potassium: 5.3 mmol/L (ref 3.5–5.3)
Sodium: 137 mmol/L (ref 135–146)
Total Bilirubin: 0.7 mg/dL (ref 0.2–1.2)
Total Protein: 7.2 g/dL (ref 6.1–8.1)
eGFR: 32 mL/min/{1.73_m2} — ABNORMAL LOW (ref >=60)

## 2024-01-13 LAB — URIC ACID: Uric Acid, Serum: 6.4 mg/dL (ref 4.0–8.0)

## 2024-01-13 LAB — HEMOGLOBIN A1C
Hgb A1c MFr Bld: 8 %{Hb} — ABNORMAL HIGH (ref <5.7)
Mean Plasma Glucose: 183 mg/dL
eAG (mmol/L): 10.1 mmol/L

## 2024-01-13 LAB — SEDIMENTATION RATE: Sed Rate: 2 mm/h (ref 0–20)

## 2024-01-13 NOTE — Progress Notes (Signed)
 Uric acid improved to 6.4 down from 8.2.  It is still recommended to achieve the goal below 6.0 so we will increase the Uloric as planned but seems to be working well and we can continue monitoring for any flares.  Sedimentation rate of 2 is normal and does not indicate active inflammation.  His kidney function is about the same with estimated GFR of 32 compared to 36 last time.  Hemoglobin A1c was 8.0% which is improving, and I will forward that message to Dr. De Peru.

## 2024-01-13 NOTE — Progress Notes (Signed)
 We checked A1c with labs Monday.

## 2024-01-15 MED ORDER — LIDOCAINE HCL 1 % IJ SOLN
2.0000 mL | INTRAMUSCULAR | Status: AC | PRN
  Administered 2024-01-12: 2 mL

## 2024-01-15 MED ORDER — TRIAMCINOLONE ACETONIDE 40 MG/ML IJ SUSP
40.0000 mg | INTRAMUSCULAR | Status: AC | PRN
  Administered 2024-01-12: 40 mg via INTRA_ARTICULAR

## 2024-01-19 ENCOUNTER — Ambulatory Visit (INDEPENDENT_AMBULATORY_CARE_PROVIDER_SITE_OTHER): Payer: 59 | Admitting: Family Medicine

## 2024-01-19 ENCOUNTER — Encounter (HOSPITAL_BASED_OUTPATIENT_CLINIC_OR_DEPARTMENT_OTHER): Payer: Self-pay | Admitting: Family Medicine

## 2024-01-19 DIAGNOSIS — E1165 Type 2 diabetes mellitus with hyperglycemia: Secondary | ICD-10-CM

## 2024-01-19 DIAGNOSIS — H9190 Unspecified hearing loss, unspecified ear: Secondary | ICD-10-CM | POA: Diagnosis not present

## 2024-01-19 DIAGNOSIS — Z794 Long term (current) use of insulin: Secondary | ICD-10-CM | POA: Diagnosis not present

## 2024-01-19 MED ORDER — SIMETHICONE 80 MG PO TABS: 1.0000 | ORAL_TABLET | Freq: Three times a day (TID) | ORAL | 0 refills | Status: AC | PRN

## 2024-01-19 NOTE — Progress Notes (Signed)
    Procedures performed today:    None.  Independent interpretation of notes and tests performed by another provider:   None.  Brief History, Exam, Impression, and Recommendations:    BP 96/61 (BP Location: Left Arm, Patient Position: Sitting, Cuff Size: Large)   Pulse 60   Ht 5\' 10"  (1.778 m)   Wt (!) 318 lb (144.2 kg)   SpO2 96%   BMI 45.63 kg/m   Decreased hearing, unspecified laterality Assessment & Plan: He has no change in hearing.  Did discuss with another provider who suggested further evaluation with audiology.  He has not scheduled appointment as of yet and is requesting referral today to have further evaluation with cardiologist. Ear exam today with mild amount of cerumen noted within external auditory canals bilaterally, no impaction noted.  Normal appearing tympanic membranes otherwise. Referral to audiology placed today  Orders: -     Ambulatory referral to Audiology  Uncontrolled type 2 diabetes mellitus with hyperglycemia, with long-term current use of insulin (HCC) Assessment & Plan: Recent hemoglobin A1c improved at 8.0%, however still above goal. Again reviewed endocrinology referral and patient would be open to this, recommend arranging for evaluation with endocrinology, referral has been placed. Can continue with current regimen which includes Trulicity 3 mg weekly as well as insulin lispro 75/25  Orders: -     Ambulatory referral to Endocrinology  Other orders -     Simethicone; Take 1 tablet (80 mg total) by mouth 3 (three) times daily as needed.  Dispense: 90 tablet; Refill: 0  Reports being advised by GI provider on use of simethicone as needed to help with gas/bloating.  He reports that he has had difficulty obtaining medication as an OTC medication to be opaque at current facility.  He is requesting prescription today.  Did review GI records.  Prescription for simethicone sent to pharmacy on file  Return in about 4 months (around 05/20/2024) for  diabetes, hypertension.   ___________________________________________ Ivone Licht de Peru, MD, ABFM, CAQSM Primary Care and Sports Medicine [facility]

## 2024-01-19 NOTE — Assessment & Plan Note (Signed)
 He has no change in hearing.  Did discuss with another provider who suggested further evaluation with audiology.  He has not scheduled appointment as of yet and is requesting referral today to have further evaluation with cardiologist. Ear exam today with mild amount of cerumen noted within external auditory canals bilaterally, no impaction noted.  Normal appearing tympanic membranes otherwise. Referral to audiology placed today

## 2024-01-19 NOTE — Patient Instructions (Signed)
  Medication Instructions:  Your physician recommends that you continue on your current medications as directed. Please refer to the Current Medication list given to you today. --If you need a refill on any your medications before your next appointment, please call your pharmacy first. If no refills are authorized on file call the office.--   Referrals/Procedures/Imaging: Referrals placed  Follow-Up: Your next appointment:   Your physician recommends that you schedule a follow-up appointment in: 4 month follow up with Dr. de Peru  You will receive a text message or e-mail with a link to a survey about your care and experience with [place] today! We would greatly appreciate your feedback!   Thanks for letting us be apart of your health journey!!  Primary Care and Sports Medicine   Dr. Ceasar Mons Peru   We encourage you to activate your patient portal called "MyChart".  Sign up information is provided on this After Visit Summary.  MyChart is used to connect with patients for Virtual Visits (Telemedicine).  Patients are able to view lab/test results, encounter notes, upcoming appointments, etc.  Non-urgent messages can be sent to your provider as well. To learn more about what you can do with MyChart, please visit --  [email or web address].

## 2024-01-19 NOTE — Assessment & Plan Note (Addendum)
 Recent hemoglobin A1c improved at 8.0%, however still above goal. Again reviewed endocrinology referral and patient would be open to this, recommend arranging for evaluation with endocrinology, referral has been placed. Can continue with current regimen which includes Trulicity 3 mg weekly as well as insulin lispro 75/25

## 2024-03-01 ENCOUNTER — Ambulatory Visit (INDEPENDENT_AMBULATORY_CARE_PROVIDER_SITE_OTHER): Payer: 59 | Admitting: Vascular Surgery

## 2024-03-01 ENCOUNTER — Encounter (INDEPENDENT_AMBULATORY_CARE_PROVIDER_SITE_OTHER): Payer: Self-pay | Admitting: Vascular Surgery

## 2024-03-01 VITALS — BP 154/79 | HR 86 | Resp 16 | Wt 317.0 lb

## 2024-03-01 DIAGNOSIS — I890 Lymphedema, not elsewhere classified: Secondary | ICD-10-CM

## 2024-03-01 DIAGNOSIS — I10 Essential (primary) hypertension: Secondary | ICD-10-CM | POA: Diagnosis not present

## 2024-03-01 DIAGNOSIS — J45909 Unspecified asthma, uncomplicated: Secondary | ICD-10-CM | POA: Diagnosis not present

## 2024-03-01 DIAGNOSIS — I872 Venous insufficiency (chronic) (peripheral): Secondary | ICD-10-CM

## 2024-03-01 DIAGNOSIS — Z794 Long term (current) use of insulin: Secondary | ICD-10-CM

## 2024-03-01 DIAGNOSIS — E1140 Type 2 diabetes mellitus with diabetic neuropathy, unspecified: Secondary | ICD-10-CM

## 2024-03-01 NOTE — Progress Notes (Signed)
 MRN : [id number]  [name] is a 73 y.o. (07-04-1951) male who presents with chief complaint of legs swell.  History of Present Illness:   The patient returns to the office for followup evaluation regarding leg swelling.  The swelling has persisted and the pain associated with swelling continues. There have not been any interval development of a ulcerations or wounds.  Since the previous visit the patient has been wearing graduated compression stockings and has noted little if any improvement in the lymphedema. The patient has been using compression routinely morning until night.  The patient also states elevation during the day and exercise is being done too.    No outpatient medications have been marked as taking for the 03/01/24 encounter (Appointment) with Prescilla Brod, Ninette Basque, MD.   Current Facility-Administered Medications for the 03/01/24 encounter (Appointment) with Prescilla Brod, Ninette Basque, MD  Medication   bupivacaine  (MARCAINE ) 0.5 % (with pres) injection 3 mL    Past Medical History:  Diagnosis Date   ALLERGIC RHINITIS 08/11/2006   Arthritis 2020   ASTHMA 08/11/2006   Bradycardia    Beta blocker d/c'd 2022   Chronic atrial fibrillation (HCC) 08/2014   Chronic combined systolic and diastolic CHF (congestive heart failure) [facility])    Cardiology f/u 12/2017: pt volume overloaded (R heart dysf suspected), BNP very high, lasix  increased.  Repeat echo 12/2017: normal LV EF, mild DD, +RV syst dysfxn, mod pulm HTN, biatrial enlgmt.   Chronic constipation    Chronic renal insufficiency, stage 3 (moderate) (HCC)    Colon cancer screening 02/2021   02/2021 Cologuard POS->GI ref   DIABETES MELLITUS, TYPE II 08/11/2006   Heart murmur 1990   AFib   HYPERTENSION 08/11/2006   IDA (iron deficiency anemia)    04/2022.  Iron started.  Needs hemoccults   Impaired mobility and endurance    MRSA infection 06/2018   LL venous stasis ulcer infected    Normocytic anemia 2016-2019   03/2018 B12 normal, iron ok (ferritin borderline low).   OBESITY, MORBID 12/14/2007   saxenda started 05/2020 by [facility] wt mgmt center   OSA (obstructive sleep apnea)    to get sleep study with [facility]) as of 12/03/2018 consult.   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  [id number] ID started him on amoxil  prophylaxis---ineffective.  End 2018/Jan 2019 penicillin  G injections prophyl helpful but pt declined to continue this as of 01/2018 ID f/u.  ID talked him into resuming monthly penicillin  G as of 02/2018 f/u.     Restless leg syndrome    Rx'd clonazepam  09/2017 and pt refused to take it after reading the medication's potential side effects.   Venous stasis ulcers of both lower extremities (HCC)    Severe lymphedema.  wound clinic care ongoing as of 01/2018    Past Surgical History:  Procedure Laterality Date   Carotid dopplers  07/23/2018   Left NORMAL.  Right 1-39% ICA stenosis, with <50% distal CCA stenosis (not hemodynamically significant)   LE venous dopplers Bilateral 03/06/2022   no dvt   RIGHT HEART CATH N/A 02/19/2022   Marked volume overload, R>>L HF, preserved CO. Procedure: RIGHT HEART CATH;  Surgeon: Mardell Shade, MD;  Location: [facility] INVASIVE CV LAB;  Service: Cardiovascular;  Laterality: N/A;  TRANSTHORACIC ECHOCARDIOGRAM  11/2007; 09/2014; 11/2015;12/2017   LV fxn normal, EF normal, mild dilation of left atrium.  2015 grade II diast dysfxn.  2017 EF 55-60%. 2019: LVEF 60-65%, mild RV syst dysf,biatrial enlgmt, mod pulm htn. 02/14/22 EF 55-60%, unable to assess diast fxn, LVH, o/w normal   URETERAL STENT PLACEMENT     virtual colonoscopy  01/2011   Normal    Social History Social History   Tobacco Use   Smoking status: Never    Passive exposure: Never   Smokeless tobacco: Never  Vaping Use   Vaping status: Never Used  Substance Use Topics   Alcohol use:  Not Currently    Comment: Stopped late 70s   Drug use: Never    Family History Family History  Problem Relation Age of Onset   Diabetes Mother    Hearing loss Father    Dementia Sister    Pancreatic cancer Sister    Cancer Maternal Grandmother     Allergies  Allergen Reactions   Other Other (See Comments)    Sneezing, coughing   Hydralazine  Other (See Comments)    Drowsiness/sedation/mental fog     REVIEW OF SYSTEMS (Negative unless checked)  Constitutional: [] Weight loss  [] Fever  [] Chills Cardiac: [] Chest pain   [] Chest pressure   [] Palpitations   [] Shortness of breath when laying flat   [] Shortness of breath with exertion. Vascular:  [] Pain in legs with walking   [x] Pain in legs with standing  [] History of DVT   [] Phlebitis   [x] Swelling in legs   [] Varicose veins   [] Non-healing ulcers Pulmonary:   [] Uses home oxygen    [] Productive cough   [] Hemoptysis   [] Wheeze  [] COPD   [] Asthma Neurologic:  [] Dizziness   [] Seizures   [] History of stroke   [] History of TIA  [] Aphasia   [] Vissual changes   [] Weakness or numbness in arm   [] Weakness or numbness in leg Musculoskeletal:   [] Joint swelling   [] Joint pain   [] Low back pain Hematologic:  [] Easy bruising  [] Easy bleeding   [] Hypercoagulable state   [] Anemic Gastrointestinal:  [] Diarrhea   [] Vomiting  [] Gastroesophageal reflux/heartburn   [] Difficulty swallowing. Genitourinary:  [] Chronic kidney disease   [] Difficult urination  [] Frequent urination   [] Blood in urine Skin:  [] Rashes   [] Ulcers  Psychological:  [] History of anxiety   []  History of major depression.  Physical Examination  There were no vitals filed for this visit. There is no height or weight on file to calculate BMI. Gen: WD/WN, NAD Head: [place]/AT, No temporalis wasting.  Ear/Nose/Throat: Hearing grossly intact, nares w/o erythema or drainage, pinna without lesions Eyes: PER, EOMI, sclera nonicteric.  Neck: Supple, no gross masses.  No JVD.  Pulmonary:  Good  air movement, no audible wheezing, no use of accessory muscles.  Cardiac: RRR, precordium not hyperdynamic. Vascular:  scattered varicosities present bilaterally.  Mild venous stasis changes to the legs bilaterally.  3-4+ soft pitting edema, CEAP [id number]  Vessel Right Left  Radial Palpable Palpable  Gastrointestinal: soft, non-distended. No guarding/no peritoneal signs.  Musculoskeletal: M/S 5/5 throughout.  No deformity.  Neurologic: CN 2-12 intact. Pain and light touch intact in extremities.  Symmetrical.  Speech is fluent. Motor exam as listed above. Psychiatric: Judgment intact, Mood & affect appropriate for pt's clinical situation. Dermatologic: Venous rashes no ulcers noted.  No changes consistent with cellulitis. Lymph : No lichenification or skin changes of chronic lymphedema.  CBC Lab Results  Component Value Date   WBC 8.6 10/24/2023  HGB 17.9 (H) 10/24/2023   HCT 55.9 (H) 10/24/2023   MCV 92 10/24/2023   PLT 218 10/24/2023    BMET    Component Value Date/Time   NA 137 01/12/2024 1558   NA 139 10/24/2023 1127   K 5.3 01/12/2024 1558   CL 96 (L) 01/12/2024 1558   CO2 34 (H) 01/12/2024 1558   GLUCOSE 123 (H) 01/12/2024 1558   BUN 31 (H) 01/12/2024 1558   BUN 26 10/24/2023 1127   CREATININE 2.12 (H) 01/12/2024 1558   CALCIUM  9.1 01/12/2024 1558   GFRNONAA 39 (L) 06/30/2023 0555   GFRAA 63 02/09/2020 1453   CrCl cannot be calculated (Patient's most recent lab result is older than the maximum 21 days allowed.).  COAG No results found for: "INR", "PROTIME"  Radiology No results found.   Assessment/Plan 1. Lymphedema (Primary) Recommend:  Patient notes that his swelling has been much worse.  It has been refractory to his previous treatments including compression and lymphedema pump.  No new open wounds or sores.  I have had a long discussion with the patient regarding swelling and why it  causes symptoms.  Patient will begin wearing graduated compression on  a daily basis a prescription was given. The patient will  wear the stockings first thing in the morning and removing them in the evening. The patient is instructed specifically not to sleep in the stockings.   In addition, behavioral modification will be initiated.  This will include frequent elevation, use of over the counter pain medications and exercise such as walking.  Because of his current poorly controlled edema I will see him back in 6 weeks.  2. Venous (peripheral) insufficiency Recommend:  Patient notes that his swelling has been much worse.  It has been refractory to his previous treatments including compression and lymphedema pump.  No new open wounds or sores.  I have had a long discussion with the patient regarding swelling and why it  causes symptoms.  Patient will begin wearing graduated compression on a daily basis a prescription was given. The patient will  wear the stockings first thing in the morning and removing them in the evening. The patient is instructed specifically not to sleep in the stockings.   In addition, behavioral modification will be initiated.  This will include frequent elevation, use of over the counter pain medications and exercise such as walking.  Because of his current poorly controlled edema I will see him back in 6 weeks.  3. Hypertension, essential Continue antihypertensive medications as already ordered, these medications have been reviewed and there are no changes at this time.  4. Uncomplicated asthma, unspecified asthma severity, unspecified whether persistent Continue pulmonary medications and aerosols as already ordered, these medications have been reviewed and there are no changes at this time.   5. Type 2 diabetes mellitus with diabetic neuropathy, with long-term current use of insulin  (HCC) Continue hypoglycemic medications as already ordered, these medications have been reviewed and there are no changes at this time.  Hgb A1C to be  monitored as already arranged by primary service    Devon Fogo, MD  03/01/2024 12:33 PM

## 2024-03-06 ENCOUNTER — Encounter (INDEPENDENT_AMBULATORY_CARE_PROVIDER_SITE_OTHER): Payer: Self-pay | Admitting: Vascular Surgery

## 2024-03-15 ENCOUNTER — Encounter (HOSPITAL_BASED_OUTPATIENT_CLINIC_OR_DEPARTMENT_OTHER): Payer: Self-pay | Admitting: Family

## 2024-03-15 ENCOUNTER — Ambulatory Visit (INDEPENDENT_AMBULATORY_CARE_PROVIDER_SITE_OTHER): Payer: 59 | Admitting: Family

## 2024-03-15 VITALS — BP 122/68 | HR 61 | Ht 70.0 in | Wt 320.0 lb

## 2024-03-15 DIAGNOSIS — D6859 Other primary thrombophilia: Secondary | ICD-10-CM | POA: Diagnosis not present

## 2024-03-15 DIAGNOSIS — I10 Essential (primary) hypertension: Secondary | ICD-10-CM

## 2024-03-15 DIAGNOSIS — I5032 Chronic diastolic (congestive) heart failure: Secondary | ICD-10-CM

## 2024-03-15 DIAGNOSIS — I4819 Other persistent atrial fibrillation: Secondary | ICD-10-CM | POA: Diagnosis not present

## 2024-03-15 NOTE — Progress Notes (Signed)
 Cardiology Office Note:  .   Date:  03/15/2024  ID:  Saratha Cunas Mikel, DOB [birth year], MRN [id number] PCP: de [place], Raymond J, MD  Nephrology: Dr. [facility] Providers Cardiologist:  Hazle Lites, MD Cardiology APP:  Ervin Heath, [place]  Nephrology: Dr. Edson Graces   History of Present Illness: .   Finneas Stengel Little is a 73 y.o. male with a hx of morbid obesity, DM 2, chronic diastolic heart failure, persistent atrial fibrillation, hypertension, bilateral lower extremity lymphedema with chronic wounds.   Admitted 02/2022 with acute on chronic heart failure.  He noted eating a lot of high sodium meals while staying in a hotel waiting to move to a new apartment.  Diuresed with IV Lasix  and treated for UTI.  Echo with EF 55 to 60%, RV not well visualized.  RHC with RA 18, PA 63/19 (35), Fick CO/Cl 10.5/3.8, Thermo CO/Cl 7.2/2.6, PVR 1.9 WU (thermo), PAPi 2.4.  There was suspected pulmonary hypertension due to combination of left heart disease and primary pulmonary hypertension related to untreated OSA and obesity.  He had a 78 pound weight loss during admission.  Discharged on torsemide  40 mg daily.  Discharge weight 333 pounds.   Initial TOC visit had gained 20pounds at SNF. Torsemide  increased to 80mg  QD. Seen 04/05/22 still volume overloaded. Recommended take dose of metolazone  for 2 days then transition to once per week with potassium.    Seen 04/16/22 noted to be taking Metolazone  2.5mg  and Torsemide  80mg  QD. Metolazone  reduced to twice per week. Follow up labs 04/23/22 with creatinine 2.74, K 126, GFR 24 recommended for admission.    Hospitalized 6/20-6/22/23 given 500cc bolus and 100 mL/hr normal saline. Metolazone  and Torsemide  held. He was discharge don Torsemide . Discharge weight 139.7 kg.    At visit 06/17/22 noted SNF had not changed Metolazone  from 5mg  to 2.5mg  as recommended by PCP. Kidney function was improved 06/19/22 with creatinine 1.37 with Torsemide  40mg  QD and Metolazone   2.5mg  QD. He saw Dr. Maximo Spar 09/20/22 and was stable from a cardiac perspective with no changes made.   Seen 03/25/23,  Farxiga  initiated.  At visit 06/24/2023 noted successful weight loss of 334 pounds down to 301 pounds. Present regimen from SNF and nephrologist per SNF documentation Metolazone  5mg  M/W/F and Torsemide  40mg  BID continued.   Last seen 10/24/2023.  Nephrology had discontinued his metolazone  he remained on torsemide  40 mg twice daily.  Labs with BNP 180.3.  Recommended to increase torsemide  to 60 mg twice daily for 2 days then return to 40 mg twice daily.  Labs 01/12/2024 creatinine 2.12, K5.3, GFR 32.  Seen by [facility] 02/22/24 due to lymphedema.  Compression stockings recommended.  Presents today for follow-up.  Weight down 2 pounds from clinic visit 5 months ago. Reports he continues to stand for exercise but has to build up leg strength in order to maintain standing position. Not presently working with PT and reports difficulty getting staff at SNF to assist him to stand/exercise. Upcoming visit with audiology due to hearing concerns. Reports no chest pain, dyspnea, orthopnea, PND, palpitations. LE edema stable at baseline and improved with compression stockings.   ROS: Please see the history of present illness.    All other systems reviewed and are negative.   Studies Reviewed: .        Cardiac Studies & Procedures   ______________________________________________________________________________________________ CARDIAC CATHETERIZATION  CARDIAC CATHETERIZATION 02/19/2022  Conclusion Findings:  RA = 18 RV = 60/24 PA =  63/19 (35) PCW =  22 Fick cardiac output/index = 10.5/3.8 Thermo CO/CI = 7.2/2.6 PVR = 1.9 WU (Thermo) FA sat = 92% PA sat = 63%, 65% High SVC = 68% PAPi = 2.4  Assessment: 1. Marked volume overload with R>>L heart failure 2. Preserved cardiac output  Plan/Discussion:  Remains markedly volume overloaded. Consider use of lasix  gtt +/- acetazolamide  or  metolazone  to facilitate diuresis. Keep LEs wrapped.  Jules Oar, MD 9:20 AM     ECHOCARDIOGRAM  ECHOCARDIOGRAM COMPLETE 02/14/2022  Narrative ECHOCARDIOGRAM REPORT    Patient Name:   ERBIE MAROTTA Gaspari Date of Exam: 02/14/2022 Medical Rec #:  [id number]          Height:       70.0 in Accession #:    [id number]         Weight:       411.2 lb Date of Birth:  [birth year]           BSA:          2.835 m Patient Age:    70 years           BP:           115/50 mmHg Patient Gender: M                  HR:           60 bpm. Exam Location:  Inpatient  Procedure: 2D Echo and Intracardiac Opacification Agent  Indications:    Cardiomegaly  History:        Patient has prior history of Echocardiogram examinations, most recent 12/29/2017. Arrythmias:Atrial Fibrillation; Risk Factors:Diabetes and Hypertension.  Sonographer:    Venice Gillis Referring Phys: [id number] Lucy Sack MACNEIL   Sonographer Comments: Patient is morbidly obese. Image acquisition challenging due to patient body habitus. IMPRESSIONS   1. Left ventricular ejection fraction, by estimation, is 55 to 60%. The left ventricle has normal function. The left ventricle has no regional wall motion abnormalities. There is moderate left ventricular hypertrophy. Left ventricular diastolic function could not be evaluated. 2. Right ventricular systolic function was not well visualized. The right ventricular size is not well visualized. 3. Left atrial size was mildly dilated. 4. The mitral valve is normal in structure. No evidence of mitral valve regurgitation. 5. The aortic valve is normal in structure. There is mild calcification of the aortic valve. Aortic valve regurgitation is trivial.  FINDINGS Left Ventricle: Left ventricular ejection fraction, by estimation, is 55 to 60%. The left ventricle has normal function. The left ventricle has no regional wall motion abnormalities. The left ventricular internal cavity size was  normal in size. There is moderate left ventricular hypertrophy. Left ventricular diastolic function could not be evaluated due to atrial fibrillation. Left ventricular diastolic function could not be evaluated.  Right Ventricle: The right ventricular size is not well visualized. Right vetricular wall thickness was not well visualized. Right ventricular systolic function was not well visualized.  Left Atrium: Left atrial size was mildly dilated.  Right Atrium: Right atrial size was normal in size.  Pericardium: There is no evidence of pericardial effusion.  Mitral Valve: The mitral valve is normal in structure. No evidence of mitral valve regurgitation.  Tricuspid Valve: The tricuspid valve is grossly normal. Tricuspid valve regurgitation is mild.  Aortic Valve: The aortic valve is normal in structure. There is mild calcification of the aortic valve. Aortic valve regurgitation is trivial. Aortic valve peak gradient measures 6.3 mmHg.  Pulmonic Valve: The pulmonic valve was normal  in structure. Pulmonic valve regurgitation is not visualized.  Aorta: The aortic root and ascending aorta are structurally normal, with no evidence of dilitation.  IAS/Shunts: The interatrial septum was not well visualized.   LEFT VENTRICLE PLAX 2D LVIDd:         5.10 cm   Diastology LVIDs:         3.70 cm   LV e' medial:    7.20 cm/s LV PW:         1.30 cm   LV E/e' medial:  17.6 LV IVS:        1.40 cm   LV e' lateral:   3.53 cm/s LVOT diam:     2.00 cm   LV E/e' lateral: 36.0 LV SV:         66 LV SV Index:   23 LVOT Area:     3.14 cm   IVC IVC diam: 3.70 cm  LEFT ATRIUM              Index LA diam:        4.80 cm  1.69 cm/m LA Vol (A2C):   108.0 ml 38.09 ml/m LA Vol (A4C):   75.4 ml  26.58 ml/m LA Biplane Vol: 107.0 ml 37.74 ml/m AORTIC VALVE                 PULMONIC VALVE AV Area (Vmax): 2.43 cm     PV Vmax:       0.90 m/s AV Vmax:        125.50 cm/s  PV Peak grad:  3.2 mmHg AV Peak  Grad:   6.3 mmHg LVOT Vmax:      97.00 cm/s LVOT Vmean:     57.300 cm/s LVOT VTI:       0.211 m  AORTA Ao Root diam: 3.20 cm Ao Asc diam:  3.60 cm  MITRAL VALVE                TRICUSPID VALVE MV Area (PHT): 4.99 cm     TR Peak grad:   38.7 mmHg MV Decel Time: 152 msec     TR Vmax:        311.00 cm/s MV E velocity: 127.00 cm/s MV A velocity: 36.50 cm/s   SHUNTS MV E/A ratio:  3.48         Systemic VTI:  0.21 m Systemic Diam: 2.00 cm  Ahmad Alert MD Electronically signed by Ahmad Alert MD Signature Date/Time: 02/14/2022/5:04:52 PM    Final          ______________________________________________________________________________________________         Risk Assessment/Calculations:    CHA2DS2-VASc Score = 4   This indicates a 4.8% annual risk of stroke. The patient's score is based upon: CHF History: 1 HTN History: 1 Diabetes History: 1 Stroke History: 0 Vascular Disease History: 0 Age Score: 1 Gender Score: 0            Physical Exam:   VS:  BP 122/68   Pulse 61   Ht 5\' 10"  (1.778 m)   Wt (!) 320 lb (145.2 kg)   SpO2 95%   BMI 45.92 kg/m    Wt Readings from Last 3 Encounters:  03/15/24 (!) 320 lb (145.2 kg)  03/01/24 (!) 317 lb (143.8 kg)  01/19/24 (!) 318 lb (144.2 kg)    GEN: Well nourished, overweight, well developed in no acute distress NECK: No JVD; No carotid bruits CARDIAC: IRIR, no murmurs, rubs, gallops RESPIRATORY:  Clear  to auscultation without rales, wheezing or rhonchi  ABDOMEN: Soft, non-tender, non-distended EXTREMITIES:  No edema; No deformity. Nonpitting bilateral LE edema. Bilateral LE with compression wraps.  ASSESSMENT AND PLAN: .    HFpEF / Right sided heart failure / Pulmonary hypertension -. Volume status difficult to ascertain due to body habitus. Weight down 2 lbs from clinic visit 5 months ago. Bilateral LE in compression wrap with trace to nonpitting edema on exam with lymphedema-like appearance. Diuretic dosing per  nephrology, continue Torsemide  40mg  BID.  Nephrology previously stopped metolazone . Low sodium diet, fluid restriction <2L, and daily weights encouraged. Educated to contact our office for weight gain of 2 lbs overnight or 5 lbs in one week.    Snores - Consider sleep study when discharged from SNF. Given RHC and body habitus, would require in lab split night sleep study.   Longstanding persistent atrial fibrillation -asymptomatic in regards to atrial fibrillation with no palpitations.  Rate controlled today continue diltiazem  120 mg daily.  Continue Eliquis  5 mg twice daily.  Denies bleeding complications.  Does not meet dose reduction criteria. 10/24/23 Hb 17.9.    CKDIIIb - Careful titration of diuretic and antihypertensive. Follows with Dr. Edson Graces. Farxiga  has been discontinued since last visit, potentially due to renal function. Will forward note to Dr. Edson Graces and could consider SGLT2i if appropriate from nephrology perspective. Has follow up with nephrology in July per Mr. Krizek's report.   DM2 - Currently managed at SNF and with PCP. 06/26/23 A1c 8.4 ? 01/12/24 A1c 8.0. Has been referred to endocrinology by PCP with upcoming visit with Dr. Vertell Gory 05/13/2024.   HTN - BP well controlled. Continue current antihypertensive regimen Diltiazem  120mg  daily, Torsemide  40mg  BID.    Lymphedema- Bilateral LE compression wraps in place.  Follows with [facility].    OA - Limits mobility. Requested SNF provide resource of either PT or staff to help him practice standing to improve mobility, optimize cardiovascular health, and optimize blood flow for venous insufficiency.         Dispo: follow up in 6 months  Signed, Clearnce Curia, NP

## 2024-03-15 NOTE — Patient Instructions (Signed)
 Medication Instructions:  Your physician recommends that you continue on your current medications as directed. Please refer to the Current Medication list given to you today.  Follow-Up: 6 months with Dr.. Maximo Spar, Slater Duncan, NP or Neomi Banks, NP

## 2024-03-17 ENCOUNTER — Ambulatory Visit: Admitting: "Endocrinology

## 2024-03-23 ENCOUNTER — Ambulatory Visit: Attending: Family Medicine | Admitting: Audiologist

## 2024-03-23 ENCOUNTER — Encounter (INDEPENDENT_AMBULATORY_CARE_PROVIDER_SITE_OTHER): Payer: Self-pay

## 2024-03-23 DIAGNOSIS — H6121 Impacted cerumen, right ear: Secondary | ICD-10-CM | POA: Insufficient documentation

## 2024-03-23 DIAGNOSIS — H903 Sensorineural hearing loss, bilateral: Secondary | ICD-10-CM | POA: Diagnosis present

## 2024-03-23 NOTE — Procedures (Signed)
  Outpatient Audiology and [facility] [address] [place], [place]  [address] [phone]  AUDIOLOGICAL  EVALUATION  NAME: [name]     DOB:   [birth year]      MRN: [id number]                                                                                     DATE: 03/23/2024     REFERENT: de [place], Raymond J, MD STATUS: Outpatient DIAGNOSIS: Sensorineural Hearing Loss, Cerumen Right Ear    History: [name] was seen for an audiological evaluation due to difficulty hearing people clearly on occasion.  [name] denies pain, pressure, or tinnitus.  [name] no significant history of hazardous noise exposure. [name] has not had a hearing test since elementary school Medical history shows risk for hearing loss due to uncontrolled diabetes.     Evaluation:  Otoscopy showed a clear view of the tympanic membrane in the left ear, right ear shows cerumen build up occluding view Tympanometry results were consistent with normal middle ear function, bilaterally   Audiometric testing was completed using Conventional Audiometry techniques with insert earphones and supraural headphones. Test results are consistent with normal sloping to moderate sensorineural hearing loss bilaterally. Speech Recognition Thresholds were obtained at 25 dB HL in the right ear and at 25 dB HL in the left ear. Word Recognition Testing was completed at  40dB SL and [name] scored 100% in each ear at 65dB, loud conversation level.   Results:  The test results were reviewed with [name]. [name] has a mild to moderate high frequency sensorineural hearing loss. [name] has great speech understanding at loud conversational levels. [name] will likely miss high pitched consonants like /sh/ and /f/ if [name] cannot the speakers face. [name] is a borderline hearing aid candidate. [name] does not feel [name] struggles on a daily basis. Audiogram printed and provided to [name].    Recommendations: Annual audiometric testing recommended to monitor  hearing loss for progression due to diabetes. [name] informed [name] will likely need aids in the next few years due to expected progression of hearing loss. Debrox Earwax Removal Drops are a safe and inexpensive in-home solution for wax removal. Debrox Earwax Removal Kit includes a soft rubber bulb syringe to rinse your ear after using Debrox Earwax Removal Drops.    32 minutes spent testing and counseling on results.   If you have any questions please feel free to contact me at [phone].  Raynald Calkins Stalnaker Au.D.  Audiologist   03/23/2024  2:30 PM  Cc: de [place], Raymond J, MD

## 2024-04-05 DIAGNOSIS — D751 Secondary polycythemia: Secondary | ICD-10-CM | POA: Diagnosis not present

## 2024-04-05 DIAGNOSIS — E1122 Type 2 diabetes mellitus with diabetic chronic kidney disease: Secondary | ICD-10-CM | POA: Diagnosis not present

## 2024-04-05 NOTE — Progress Notes (Signed)
 Office Visit Note  Patient: [name]             Date of Birth: 1951-01-15           MRN: [id number]             PCP: de [place], Raymond J, MD Referring: de [place], Raymond J, MD Visit Date: 04/19/2024   Subjective:  Follow-up (Patient states he has been getting knee gel injections. Patient states he was advised to do do leg strengthening but the people at his facility have not started it. )   History of Present Illness:   Discussed the use of AI scribe software for clinical note transcription with the patient, who gave verbal consent to proceed.  History of Present Illness   [name] is a 73 y.o. male here for follow up on his Gout on uloric  80 mg daily.    His uric acid levels have improved, with recent labs showing a decrease to 5.1 mg/dL, which is within the target range of less than 6 mg/dL. This improvement is attributed to an increased dose of Uloric . He has not experienced any recent flare-ups of gout or arthritis, although he notes persistent soreness and numbness in his fingers, particularly affecting two fingers. The sensation is described as 'numb' and lacking feeling when touched.  He continues to experience shoulder pain, which has not improved following a previous injection. He is only able to raise his shoulder to a certain height, stating 'that's about as high as I can go.' There has been no improvement from physical therapy, and he is frustrated with the facility's lack of effective treatment.  His daily routine involves transferring from bed to chair, standing briefly to swivel and sit. His legs become tired by the end of the day, requiring the use of a transfer board to move from chair to bed. He has been consistent with this routine but feels his legs are weakened due to prolonged periods without standing.  No recent illness. He reports occasional pinching sensations in his hand, which are alleviated by wearing a brace. He has an open wound on his back  that is tender, but he does not believe the wraps are too tight. He is able to manage his brace independently and does not require assistance.       Previous HPI 01/12/2024 [name] is a 73 y.o. male here for follow up on his Gout. Since his last visit, he denies having any flares, although the pain in his right wrist and left DIP joint on the second digit of his hand are still tender. He has been taking his febuxostat  as prescribed.    He has seen Ortho for his shoulder and right knee osteoarthritis and will be receiving HA acid injections. However, he feels that his legs lack strength when trying to stand up. He is wheelchair bound. Has received injections on his knee but have not provided much relief. Notes that he did physical therapy and found that beneficial. For his right shoulder, he has limited range of motion. Has been hesitant to ask the aide to help him with warm compresses, but found these helpful. Has been doing physical therapy also found it beneficial.     Previous HPI 08/19/23 [name] is a 73 y.o. male here for evaluation of joint pain and swelling and erythema especially with recent episode of severe left hand and wrist swelling but also has some persistent symptoms with pain in the  right wrist and chronic swelling in both lower legs.  Multiple comorbidities including congestive heart failure, hypertension, A-fib, venous insufficiency, asthma, type 2 diabetes with associated neuropathy, lymphedema, and chronic kidney disease.  He did not recall a history of gout until this in the past year he feels symptoms have been much worse since moving to his current living facility setting.  This has also been accompanied by adjustments for diuretic medications and some exacerbation of chronic kidney disease.  He saw Dr. Leonce in sports medicine clinic in March noting hyperuricemia since at least June 2023 not on any urate lowering therapy did improve with short course of  oral NSAIDs.  He has suffered a severe pain and swelling in his left hand in February at the wrist subsequently some swelling and right hand PIP joints more recently has persistent trouble with right wrist pain describes a pinching type of sensation occurs usually provoked after use.   07/2023 eGFR 16   10/2023 36   01/2023 Uric acid 13.7   04/01/2023 Uric acid 8.2     Previous HPI 08/19/23 [name] is a 73 y.o. male here for evaluation of joint pain and swelling and erythema especially with recent episode of severe left hand and wrist swelling but also has some persistent symptoms with pain in the right wrist and chronic swelling in both lower legs.  Multiple comorbidities including congestive heart failure, hypertension, A-fib, venous insufficiency, asthma, type 2 diabetes with associated neuropathy, lymphedema, and chronic kidney disease.  He did not recall a history of gout until this in the past year he feels symptoms have been much worse since moving to his current living facility setting.  This has also been accompanied by adjustments for diuretic medications and some exacerbation of chronic kidney disease.  He saw Dr. Leonce in sports medicine clinic in March noting hyperuricemia since at least June 2023 not on any urate lowering therapy did improve with short course of oral NSAIDs.  He has suffered a severe pain and swelling in his left hand in February at the wrist subsequently some swelling and right hand PIP joints more recently has persistent trouble with right wrist pain describes a pinching type of sensation occurs usually provoked after use.   07/2023 eGFR 16   01/2023 Uric acid 13.7   Review of Systems  Constitutional:  Negative for fatigue.  HENT:  Positive for mouth dryness. Negative for mouth sores.   Eyes:  Negative for dryness.  Respiratory:  Negative for shortness of breath.   Cardiovascular:  Negative for chest pain and palpitations.  Gastrointestinal:   Negative for blood in stool, constipation and diarrhea.  Endocrine: Negative for increased urination.  Genitourinary:  Negative for involuntary urination.  Musculoskeletal:  Positive for joint pain and joint pain. Negative for gait problem, joint swelling, myalgias, muscle weakness, morning stiffness, muscle tenderness and myalgias.  Skin:  Negative for color change, rash, hair loss and sensitivity to sunlight.  Allergic/Immunologic: Positive for susceptible to infections.  Neurological:  Negative for dizziness and headaches.  Hematological:  Negative for swollen glands.  Psychiatric/Behavioral:  Negative for depressed mood and sleep disturbance. The patient is not nervous/anxious.     PMFS History:  Patient Active Problem List   Diagnosis Date Noted   Decreased hearing 01/19/2024   CKD (chronic kidney disease) 11/28/2023   Urinary frequency 09/17/2023   Pain in right wrist 08/19/2023   Chronic gout due to renal impairment 08/19/2023   Onychomycosis 08/19/2023   Hypokalemia 06/26/2023  Primary osteoarthritis of right knee 06/04/2022   Primary osteoarthritis of left knee 06/04/2022   Body mass index 45.0-49.9, adult (HCC) 06/04/2022   Hyponatremia 04/23/2022   Pressure injury of skin 03/07/2022   Obesity, Class III, BMI 40-49.9 (morbid obesity)    Uncontrolled type 2 diabetes mellitus with hyperglycemia (HCC) 03/04/2018   Chronic diastolic CHF (congestive heart failure) (HCC) 12/24/2016   Lymphedema 02/22/2016   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   Chronic headaches 05/10/2015   Persistent atrial fibrillation (HCC) 08/15/2014   Type 2 diabetes mellitus with diabetic neuropathy (HCC) 08/15/2014   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   Recurrent cellulitis of lower extremity 08/02/2013   Lymphedema of both lower extremities 11/04/2008   Morbid obesity due to excess calories (HCC) 12/14/2007   Venous (peripheral) insufficiency 07/22/2007   Uncontrolled type 2 diabetes  mellitus with hyperglycemia, with long-term current use of insulin  (HCC) 08/11/2006   Hypertension, essential 08/11/2006   ALLERGIC RHINITIS 08/11/2006   Asthma 08/11/2006    Past Medical History:  Diagnosis Date   ALLERGIC RHINITIS 08/11/2006   Arthritis 2020   ASTHMA 08/11/2006   Bradycardia    Beta blocker d/c'd 2022   Chronic atrial fibrillation (HCC) 08/2014   Chronic combined systolic and diastolic CHF (congestive heart failure) [facility])    Cardiology f/u 12/2017: pt volume overloaded (R heart dysf suspected), BNP very high, lasix  increased.  Repeat echo 12/2017: normal LV EF, mild DD, +RV syst dysfxn, mod pulm HTN, biatrial enlgmt.   Chronic constipation    Chronic renal insufficiency, stage 3 (moderate) (HCC)    Colon cancer screening 02/2021   02/2021 Cologuard POS->GI ref   DIABETES MELLITUS, TYPE II 08/11/2006   Heart murmur 1990   AFib   HYPERTENSION 08/11/2006   IDA (iron deficiency anemia)    04/2022.  Iron started.  Needs hemoccults   Impaired mobility and endurance    MRSA infection 06/2018   LL venous stasis ulcer infected   Normocytic anemia 2016-2019   03/2018 B12 normal, iron ok (ferritin borderline low).   OBESITY, MORBID 12/14/2007   saxenda started 05/2020 by [facility] wt mgmt center   OSA (obstructive sleep apnea)    to get sleep study with [facility]) as of 12/03/2018 consult.   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  [id number] ID started him on amoxil  prophylaxis---ineffective.  End 2018/Jan 2019 penicillin  G injections prophyl helpful but pt declined to continue this as of 01/2018 ID f/u.  ID talked him into resuming monthly penicillin  G as of 02/2018 f/u.     Restless leg syndrome    Rx'd clonazepam  09/2017 and pt refused to take it after reading the medication's potential side effects.   Venous stasis ulcers of both lower extremities (HCC)    Severe lymphedema.  wound clinic care  ongoing as of 01/2018    Family History  Problem Relation Age of Onset   Diabetes Mother    Hearing loss Father    Dementia Sister    Pancreatic cancer Sister    Cancer Maternal Grandmother    Past Surgical History:  Procedure Laterality Date   Carotid dopplers  07/23/2018   Left NORMAL.  Right 1-39% ICA stenosis, with <50% distal CCA stenosis (not hemodynamically significant)   LE venous dopplers Bilateral 03/06/2022   no dvt   RIGHT HEART CATH N/A 02/19/2022   Marked volume overload, R>>L HF, preserved CO. Procedure: RIGHT HEART CATH;  Surgeon:  Bensimhon, Toribio SAUNDERS, MD;  Location: [facility] INVASIVE CV LAB;  Service: Cardiovascular;  Laterality: N/A;   TRANSTHORACIC ECHOCARDIOGRAM  11/2007; 09/2014; 11/2015;12/2017   LV fxn normal, EF normal, mild dilation of left atrium.  2015 grade II diast dysfxn.  2017 EF 55-60%. 2019: LVEF 60-65%, mild RV syst dysf,biatrial enlgmt, mod pulm htn. 02/14/22 EF 55-60%, unable to assess diast fxn, LVH, o/w normal   URETERAL STENT PLACEMENT     virtual colonoscopy  01/2011   Normal   Social History   Social History Narrative         Immunization History  Administered Date(s) Administered   Fluad Quad(high Dose 65+) 07/06/2019, 07/12/2020, 07/11/2021, 07/10/2022   Fluad Trivalent(High Dose 65+) 08/04/2023   Influenza Split 08/19/2011, 08/11/2012   Influenza Whole 08/11/2006, 08/17/2008, 08/08/2009, 09/17/2010   Influenza, High Dose Seasonal PF 08/05/2016, 09/08/2018   Influenza,inj,Quad PF,6+ Mos 07/26/2013, 09/11/2015, 09/15/2017   PFIZER(Purple Top)SARS-COV-2 Vaccination 12/14/2019, 01/10/2020, 09/15/2020   Pneumococcal Conjugate-13 08/05/2016   Pneumococcal Polysaccharide-23 04/22/2011, 12/08/2017   Td 04/10/2009   Zoster, Live 04/22/2011     Objective: Vital Signs: BP 125/60 (BP Location: Left Arm, Patient Position: Sitting, Cuff Size: Normal)   Pulse 65   Resp 14   Ht 5' 10 (1.778 m) Comment: patient in a wheelchair  BMI 45.92 kg/m     Physical Exam  Eyes:     Conjunctiva/sclera: Conjunctivae normal.    Cardiovascular:     Rate and Rhythm: Normal rate and regular rhythm.  Pulmonary:     Effort: Pulmonary effort is normal.     Breath sounds: Normal breath sounds.   Skin:    General: Skin is warm and dry.     Comments: Bilateral pedal edema   Neurological:     Mental Status: He is alert.   Psychiatric:        Mood and Affect: Mood normal.      Musculoskeletal Exam:  Tenderness to palpation over the right dorsal wrist. Limited range of motion on extension with radiating pain up the forearm. No palpable effusion at wrist and right hand Erythematous DIP joint over the second digit on the left hand. Preserved range of motion on the left shoulder. Limited external rotation of elevation up to 90 degrees of the right shoulder.     Investigation: No additional findings.  Imaging: No results found.  Recent Labs: Lab Results  Component Value Date   WBC 8.6 10/24/2023   HGB 17.9 (H) 10/24/2023   PLT 218 10/24/2023   NA 137 01/12/2024   K 5.3 01/12/2024   CL 96 (L) 01/12/2024   CO2 34 (H) 01/12/2024   GLUCOSE 123 (H) 01/12/2024   BUN 31 (H) 01/12/2024   CREATININE 2.12 (H) 01/12/2024   BILITOT 0.7 01/12/2024   ALKPHOS 119 12/23/2022   AST 18 01/12/2024   ALT 16 01/12/2024   PROT 7.2 01/12/2024   ALBUMIN  2.9 (L) 06/27/2023   CALCIUM  9.1 01/12/2024   GFRAA 63 02/09/2020    Speciality Comments: No specialty comments available.  Procedures:  No procedures performed Allergies: Other and Hydralazine    Assessment / Plan:     Visit Diagnoses: Chronic gout due to renal impairment of multiple sites without tophus - 01/12/2024 Uric Acid 6.4 Uric acid level at 5.1 with Uloric  80 mg daily, achieving target. No recent flare-ups. Index finger symptoms likely gout-related, expected to improve with treatment. Long-term uric acid control crucial for joint health. - Continue Uloric  80 mg daily  Primary  osteoarthritis  of right knee - Ambulatory referral to Physical Therapy, referral placed 01/12/2024 Leg weakness and edema Weakness and edema due to inactivity and insufficient physical therapy. Orders sent last time apparently without any formal therapy provided per patient. Facility lacks adequate leg strengthening exercises as recommended by vascular specialist. - Document need for leg strengthening exercises in facility paperwork to address leg weakness and edema.   Impingement syndrome of right shoulder - Tylenol  500-1000mg  q6hrs as needed for pain, Lidocaine  4% patches daily PRN -Tylenol  500-1000mg  q6hrs as needed for pain  -Lidocaine  4% patches daily PRN  -Shoulder exercises with stretch band     Orders: No orders of the defined types were placed in this encounter.  No orders of the defined types were placed in this encounter.    Follow-Up Instructions: Return in about 6 months (around 10/19/2024) for Gout/OA/edema f/u 6mos or PRN.   Lonni LELON Ester, MD  Note - This record has been created using [employer].  Chart creation errors have been sought, but may not always  have been located. Such creation errors do not reflect on  the standard of medical care.

## 2024-04-06 DIAGNOSIS — M109 Gout, unspecified: Secondary | ICD-10-CM | POA: Diagnosis not present

## 2024-04-06 DIAGNOSIS — I739 Peripheral vascular disease, unspecified: Secondary | ICD-10-CM | POA: Diagnosis not present

## 2024-04-06 DIAGNOSIS — G8929 Other chronic pain: Secondary | ICD-10-CM | POA: Diagnosis not present

## 2024-04-07 DIAGNOSIS — D751 Secondary polycythemia: Secondary | ICD-10-CM | POA: Diagnosis not present

## 2024-04-07 DIAGNOSIS — Z794 Long term (current) use of insulin: Secondary | ICD-10-CM | POA: Diagnosis not present

## 2024-04-07 DIAGNOSIS — J9601 Acute respiratory failure with hypoxia: Secondary | ICD-10-CM | POA: Diagnosis not present

## 2024-04-07 DIAGNOSIS — I482 Chronic atrial fibrillation, unspecified: Secondary | ICD-10-CM | POA: Diagnosis not present

## 2024-04-07 DIAGNOSIS — E1122 Type 2 diabetes mellitus with diabetic chronic kidney disease: Secondary | ICD-10-CM | POA: Diagnosis not present

## 2024-04-07 DIAGNOSIS — E559 Vitamin D deficiency, unspecified: Secondary | ICD-10-CM | POA: Diagnosis not present

## 2024-04-07 DIAGNOSIS — I87303 Chronic venous hypertension (idiopathic) without complications of bilateral lower extremity: Secondary | ICD-10-CM | POA: Diagnosis not present

## 2024-04-07 DIAGNOSIS — D631 Anemia in chronic kidney disease: Secondary | ICD-10-CM | POA: Diagnosis not present

## 2024-04-07 DIAGNOSIS — I5032 Chronic diastolic (congestive) heart failure: Secondary | ICD-10-CM | POA: Diagnosis not present

## 2024-04-12 ENCOUNTER — Encounter (INDEPENDENT_AMBULATORY_CARE_PROVIDER_SITE_OTHER): Payer: Self-pay | Admitting: Vascular Surgery

## 2024-04-12 ENCOUNTER — Ambulatory Visit (INDEPENDENT_AMBULATORY_CARE_PROVIDER_SITE_OTHER): Admitting: Vascular Surgery

## 2024-04-12 VITALS — BP 129/77 | HR 79 | Resp 18

## 2024-04-12 DIAGNOSIS — I872 Venous insufficiency (chronic) (peripheral): Secondary | ICD-10-CM | POA: Diagnosis not present

## 2024-04-12 DIAGNOSIS — I4819 Other persistent atrial fibrillation: Secondary | ICD-10-CM

## 2024-04-12 DIAGNOSIS — I890 Lymphedema, not elsewhere classified: Secondary | ICD-10-CM | POA: Diagnosis not present

## 2024-04-12 DIAGNOSIS — I10 Essential (primary) hypertension: Secondary | ICD-10-CM

## 2024-04-12 DIAGNOSIS — J45909 Unspecified asthma, uncomplicated: Secondary | ICD-10-CM

## 2024-04-12 NOTE — Progress Notes (Unsigned)
 MRN : [id number]  [name] is a 73 y.o. (04-15-1951) male who presents with chief complaint of legs swell.  History of Present Illness:   The patient returns to the office for followup evaluation regarding leg swelling.  The swelling has improved quite a bit and the pain associated with swelling has decreased substantially. There have not been any interval development of a ulcerations or wounds.  Since the previous visit the patient has been wearing graduated compression wraps and has noted some improvement in the lymphedema. The patient has been using compression routinely morning until night.  The patient also states elevation during the day and exercise (such as walking) is being done too.    No outpatient medications have been marked as taking for the 04/12/24 encounter (Appointment) with Prescilla Brod, Ninette Basque, MD.   Current Facility-Administered Medications for the 04/12/24 encounter (Appointment) with Prescilla Brod, Ninette Basque, MD  Medication   bupivacaine  (MARCAINE ) 0.5 % (with pres) injection 3 mL    Past Medical History:  Diagnosis Date   ALLERGIC RHINITIS 08/11/2006   Arthritis 2020   ASTHMA 08/11/2006   Bradycardia    Beta blocker d/c'd 2022   Chronic atrial fibrillation (HCC) 08/2014   Chronic combined systolic and diastolic CHF (congestive heart failure) [facility])    Cardiology f/u 12/2017: pt volume overloaded (R heart dysf suspected), BNP very high, lasix  increased.  Repeat echo 12/2017: normal LV EF, mild DD, +RV syst dysfxn, mod pulm HTN, biatrial enlgmt.   Chronic constipation    Chronic renal insufficiency, stage 3 (moderate) (HCC)    Colon cancer screening 02/2021   02/2021 Cologuard POS->GI ref   DIABETES MELLITUS, TYPE II 08/11/2006   Heart murmur 1990   AFib   HYPERTENSION 08/11/2006   IDA (iron deficiency anemia)    04/2022.  Iron started.  Needs hemoccults   Impaired mobility and endurance    MRSA infection 06/2018   LL venous  stasis ulcer infected   Normocytic anemia 2016-2019   03/2018 B12 normal, iron ok (ferritin borderline low).   OBESITY, MORBID 12/14/2007   saxenda started 05/2020 by [facility] wt mgmt center   OSA (obstructive sleep apnea)    to get sleep study with [facility]) as of 12/03/2018 consult.   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  [id number] ID started him on amoxil  prophylaxis---ineffective.  End 2018/Jan 2019 penicillin  G injections prophyl helpful but pt declined to continue this as of 01/2018 ID f/u.  ID talked him into resuming monthly penicillin  G as of 02/2018 f/u.     Restless leg syndrome    Rx'd clonazepam  09/2017 and pt refused to take it after reading the medication's potential side effects.   Venous stasis ulcers of both lower extremities (HCC)    Severe lymphedema.  wound clinic care ongoing as of 01/2018    Past Surgical History:  Procedure Laterality Date   Carotid dopplers  07/23/2018   Left NORMAL.  Right 1-39% ICA stenosis, with <50% distal CCA stenosis (not hemodynamically significant)   LE venous dopplers Bilateral 03/06/2022   no dvt   RIGHT HEART CATH N/A 02/19/2022   Marked volume overload, R>>L HF, preserved CO. Procedure: RIGHT HEART CATH;  Surgeon: Mardell Shade, MD;  Location: [facility] INVASIVE CV LAB;  Service: Cardiovascular;  Laterality: N/A;   TRANSTHORACIC ECHOCARDIOGRAM  11/2007; 09/2014; 11/2015;12/2017   LV fxn normal, EF normal, mild dilation of left atrium.  2015 grade II diast dysfxn.  2017 EF 55-60%. 2019: LVEF 60-65%, mild RV syst dysf,biatrial enlgmt, mod pulm htn. 02/14/22 EF 55-60%, unable to assess diast fxn, LVH, o/w normal   URETERAL STENT PLACEMENT     virtual colonoscopy  01/2011   Normal    Social History Social History   Tobacco Use   Smoking status: Never    Passive exposure: Never   Smokeless tobacco: Never  Vaping Use   Vaping status: Never Used  Substance Use  Topics   Alcohol use: Not Currently    Comment: Stopped late 70s   Drug use: Never    Family History Family History  Problem Relation Age of Onset   Diabetes Mother    Hearing loss Father    Dementia Sister    Pancreatic cancer Sister    Cancer Maternal Grandmother     Allergies  Allergen Reactions   Other Other (See Comments)    Sneezing, coughing   Hydralazine  Other (See Comments)    Drowsiness/sedation/mental fog     REVIEW OF SYSTEMS (Negative unless checked)  Constitutional: [] Weight loss  [] Fever  [] Chills Cardiac: [] Chest pain   [] Chest pressure   [] Palpitations   [] Shortness of breath when laying flat   [] Shortness of breath with exertion. Vascular:  [] Pain in legs with walking   [x] Pain in legs with standing  [] History of DVT   [] Phlebitis   [x] Swelling in legs   [] Varicose veins   [] Non-healing ulcers Pulmonary:   [] Uses home oxygen    [] Productive cough   [] Hemoptysis   [] Wheeze  [] COPD   [] Asthma Neurologic:  [] Dizziness   [] Seizures   [] History of stroke   [] History of TIA  [] Aphasia   [] Vissual changes   [] Weakness or numbness in arm   [] Weakness or numbness in leg Musculoskeletal:   [] Joint swelling   [] Joint pain   [] Low back pain Hematologic:  [] Easy bruising  [] Easy bleeding   [] Hypercoagulable state   [] Anemic Gastrointestinal:  [] Diarrhea   [] Vomiting  [] Gastroesophageal reflux/heartburn   [] Difficulty swallowing. Genitourinary:  [] Chronic kidney disease   [] Difficult urination  [] Frequent urination   [] Blood in urine Skin:  [] Rashes   [] Ulcers  Psychological:  [] History of anxiety   []  History of major depression.  Physical Examination  There were no vitals filed for this visit. There is no height or weight on file to calculate BMI. Gen: WD/WN, NAD he is wheelchair-bound Head: [place]/AT, No temporalis wasting.  Ear/Nose/Throat: Hearing grossly intact, nares w/o erythema or drainage, pinna without lesions Eyes: PER, EOMI, sclera nonicteric.  Neck: Supple,  no gross masses.  No JVD.  Pulmonary:  Good air movement, no audible wheezing, no use of accessory muscles.  Cardiac: RRR, precordium not hyperdynamic. Vascular:  scattered varicosities present bilaterally.  Mild venous stasis changes to the legs bilaterally.  2-3+ soft pitting edema, CEAP [id number]  Vessel Right Left  Radial Palpable Palpable  Gastrointestinal: soft, non-distended. No guarding/no peritoneal signs.  Musculoskeletal: M/S 5/5 throughout.  No deformity.  Neurologic: CN 2-12 intact. Pain and light touch intact in extremities.  Symmetrical.  Speech is fluent. Motor exam as listed above. Psychiatric: Judgment intact, Mood & affect appropriate for pt's clinical situation. Dermatologic: Venous rashes no ulcers noted.  No changes consistent with cellulitis. Lymph : No lichenification or skin changes of chronic lymphedema.  CBC Lab  Results  Component Value Date   WBC 8.6 10/24/2023   HGB 17.9 (H) 10/24/2023   HCT 55.9 (H) 10/24/2023   MCV 92 10/24/2023   PLT 218 10/24/2023    BMET    Component Value Date/Time   NA 137 01/12/2024 1558   NA 139 10/24/2023 1127   K 5.3 01/12/2024 1558   CL 96 (L) 01/12/2024 1558   CO2 34 (H) 01/12/2024 1558   GLUCOSE 123 (H) 01/12/2024 1558   BUN 31 (H) 01/12/2024 1558   BUN 26 10/24/2023 1127   CREATININE 2.12 (H) 01/12/2024 1558   CALCIUM  9.1 01/12/2024 1558   GFRNONAA 39 (L) 06/30/2023 0555   GFRAA 63 02/09/2020 1453   CrCl cannot be calculated (Patient's most recent lab result is older than the maximum 21 days allowed.).  COAG No results found for: INR, PROTIME  Radiology No results found.   Assessment/Plan 1. Lymphedema (Primary) Recommend:  No surgery or intervention at this point in time.    I have reviewed my discussion with the patient regarding venous insufficiency and secondary lymph edema and why it  causes symptoms. I have discussed with the patient the chronic skin changes that accompany these problems and  the long term sequela such as ulceration and infection.  Patient will continue wearing graduated compression on a daily basis a prescription, if needed, was given to the patient to keep this updated. The patient will  put the compression on first thing in the morning and removing them in the evening. The patient is instructed specifically not to sleep in the compression.  In addition, behavioral modification including elevation during the day will be continued.  Diet and salt restriction will also be helpful.  Previous duplex ultrasound of the lower extremities shows normal deep venous system, superficial reflux was not present.   Following the review of the ultrasound the patient will follow up in 3 months to reassess the degree of swelling and the control that graduated compression is offering.   The patient can be assessed for a Lymph Pump at that time.  However, at this time the patient states they are satisfied with the control compression and elevation is yielding.    2. Venous (peripheral) insufficiency Recommend:  No surgery or intervention at this point in time.    I have reviewed my discussion with the patient regarding venous insufficiency and secondary lymph edema and why it  causes symptoms. I have discussed with the patient the chronic skin changes that accompany these problems and the long term sequela such as ulceration and infection.  Patient will continue wearing graduated compression on a daily basis a prescription, if needed, was given to the patient to keep this updated. The patient will  put the compression on first thing in the morning and removing them in the evening. The patient is instructed specifically not to sleep in the compression.  In addition, behavioral modification including elevation during the day will be continued.  Diet and salt restriction will also be helpful.  Previous duplex ultrasound of the lower extremities shows normal deep venous system, superficial  reflux was not present.   Following the review of the ultrasound the patient will follow up in 3 months to reassess the degree of swelling and the control that graduated compression is offering.   The patient can be assessed for a Lymph Pump at that time.  However, at this time the patient states they are satisfied with the control compression and elevation is yielding.  3. Hypertension, essential Continue antihypertensive medications as already ordered, these medications have been reviewed and there are no changes at this time.  4. Persistent atrial fibrillation (HCC) Continue antiarrhythmia medications as already ordered, these medications have been reviewed and there are no changes at this time.  Continue anticoagulation as ordered by Cardiology Service  5. Uncomplicated asthma, unspecified asthma severity, unspecified whether persistent Continue pulmonary medications and aerosols as already ordered, these medications have been reviewed and there are no changes at this time.     Devon Fogo, MD  04/12/2024 1:10 PM

## 2024-04-14 ENCOUNTER — Encounter (INDEPENDENT_AMBULATORY_CARE_PROVIDER_SITE_OTHER): Payer: Self-pay | Admitting: Vascular Surgery

## 2024-04-19 ENCOUNTER — Encounter: Payer: Self-pay | Admitting: Internal Medicine

## 2024-04-19 ENCOUNTER — Ambulatory Visit: Attending: Internal Medicine | Admitting: Internal Medicine

## 2024-04-19 VITALS — BP 125/60 | HR 65 | Resp 14 | Ht 70.0 in

## 2024-04-19 DIAGNOSIS — N2581 Secondary hyperparathyroidism of renal origin: Secondary | ICD-10-CM | POA: Diagnosis not present

## 2024-04-19 DIAGNOSIS — M7541 Impingement syndrome of right shoulder: Secondary | ICD-10-CM

## 2024-04-19 DIAGNOSIS — E876 Hypokalemia: Secondary | ICD-10-CM | POA: Diagnosis not present

## 2024-04-19 DIAGNOSIS — I129 Hypertensive chronic kidney disease with stage 1 through stage 4 chronic kidney disease, or unspecified chronic kidney disease: Secondary | ICD-10-CM | POA: Diagnosis not present

## 2024-04-19 DIAGNOSIS — I890 Lymphedema, not elsewhere classified: Secondary | ICD-10-CM | POA: Diagnosis not present

## 2024-04-19 DIAGNOSIS — Z5181 Encounter for therapeutic drug level monitoring: Secondary | ICD-10-CM | POA: Diagnosis not present

## 2024-04-19 DIAGNOSIS — N1832 Chronic kidney disease, stage 3b: Secondary | ICD-10-CM | POA: Diagnosis not present

## 2024-04-19 DIAGNOSIS — I739 Peripheral vascular disease, unspecified: Secondary | ICD-10-CM | POA: Diagnosis not present

## 2024-04-19 DIAGNOSIS — M1A39X0 Chronic gout due to renal impairment, multiple sites, without tophus (tophi): Secondary | ICD-10-CM | POA: Diagnosis not present

## 2024-04-19 DIAGNOSIS — D631 Anemia in chronic kidney disease: Secondary | ICD-10-CM | POA: Diagnosis not present

## 2024-04-19 DIAGNOSIS — E1165 Type 2 diabetes mellitus with hyperglycemia: Secondary | ICD-10-CM | POA: Diagnosis not present

## 2024-04-19 DIAGNOSIS — M1711 Unilateral primary osteoarthritis, right knee: Secondary | ICD-10-CM

## 2024-04-19 DIAGNOSIS — I5032 Chronic diastolic (congestive) heart failure: Secondary | ICD-10-CM | POA: Diagnosis not present

## 2024-04-21 DIAGNOSIS — N183 Chronic kidney disease, stage 3 unspecified: Secondary | ICD-10-CM | POA: Diagnosis not present

## 2024-04-21 DIAGNOSIS — D649 Anemia, unspecified: Secondary | ICD-10-CM | POA: Diagnosis not present

## 2024-04-21 DIAGNOSIS — I5030 Unspecified diastolic (congestive) heart failure: Secondary | ICD-10-CM | POA: Diagnosis not present

## 2024-04-21 LAB — LAB REPORT - SCANNED: EGFR: 34

## 2024-04-22 DIAGNOSIS — I130 Hypertensive heart and chronic kidney disease with heart failure and stage 1 through stage 4 chronic kidney disease, or unspecified chronic kidney disease: Secondary | ICD-10-CM | POA: Diagnosis not present

## 2024-04-22 DIAGNOSIS — E1121 Type 2 diabetes mellitus with diabetic nephropathy: Secondary | ICD-10-CM | POA: Diagnosis not present

## 2024-05-03 ENCOUNTER — Ambulatory Visit (INDEPENDENT_AMBULATORY_CARE_PROVIDER_SITE_OTHER): Admitting: Family Medicine

## 2024-05-03 ENCOUNTER — Encounter (HOSPITAL_BASED_OUTPATIENT_CLINIC_OR_DEPARTMENT_OTHER): Payer: Self-pay | Admitting: Family Medicine

## 2024-05-03 VITALS — BP 128/86 | HR 66 | Ht 70.0 in | Wt 341.4 lb

## 2024-05-03 DIAGNOSIS — E1165 Type 2 diabetes mellitus with hyperglycemia: Secondary | ICD-10-CM | POA: Diagnosis not present

## 2024-05-03 DIAGNOSIS — H9190 Unspecified hearing loss, unspecified ear: Secondary | ICD-10-CM | POA: Diagnosis not present

## 2024-05-03 DIAGNOSIS — Z794 Long term (current) use of insulin: Secondary | ICD-10-CM

## 2024-05-03 NOTE — Patient Instructions (Signed)
 ?  Medication Instructions:  ?Your physician recommends that you continue on your current medications as directed. Please refer to the Current Medication list given to you today. ?--If you need a refill on any your medications before your next appointment, please call your pharmacy first. If no refills are authorized on file call the office.-- ? ? ? ?Follow-Up: ?Your next appointment:   ?Your physician recommends that you schedule a follow-up appointment in: 4 month follow up with Dr. Tommi Rumps [place] ? ?You will receive a text message or e-mail with a link to a survey about your care and experience with [place] today! We would greatly appreciate your feedback!  ? ?Thanks for letting us be apart of your health journey!!  ?Primary Care and Sports Medicine  ? ?Dr. Marcy Salvo de [place]  ? ?We encourage you to activate your patient portal called "MyChart".  Sign up information is provided on this After Visit Summary.  MyChart is used to connect with patients for Virtual Visits (Telemedicine).  Patients are able to view lab/test results, encounter notes, upcoming appointments, etc.  Non-urgent messages can be sent to your provider as well. To learn more about what you can do with MyChart, please visit --  [email or web address].   ? ?

## 2024-05-03 NOTE — Progress Notes (Signed)
    Procedures performed today:    None.  Independent interpretation of notes and tests performed by another provider:   None.  Brief History, Exam, Impression, and Recommendations:    BP 128/86 (BP Location: Right Arm, Patient Position: Sitting, Cuff Size: Large)   Pulse 66   Ht 5' 10 (1.778 m)   Wt (!) 341 lb 6.4 oz (154.9 kg)   SpO2 92%   BMI 48.99 kg/m   Uncontrolled type 2 diabetes mellitus with hyperglycemia, with long-term current use of insulin  (HCC) Assessment & Plan: Last A1c about 3 months ago, did show improvement but was still above goal. He does have appt with endocrinology in about 10 days. Continues with Trulicity  and 75/25 insulin . Due for recheck of A1c, does have appt with endo upcoming, suspect they will check in office and thus will defer for now. No changes to medications today Complete foot exam at next visit  Orders: -     Microalbumin / creatinine urine ratio; Future  Decreased hearing, unspecified laterality Assessment & Plan: Patient had evaluation with audiologist. 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 Recommendation for annual testing for monitoring. May ultimately need hearing aids in the future if progression does occur.   Return in about 4 months (around 09/02/2024).   ___________________________________________ Takeshia Wenk de [place], MD, ABFM, CAQSM Primary Care and Sports Medicine [facility]

## 2024-05-03 NOTE — Assessment & Plan Note (Signed)
 Patient had evaluation with audiologist. 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 Recommendation for annual testing for monitoring. May ultimately need hearing aids in the future if progression does occur.

## 2024-05-03 NOTE — Assessment & Plan Note (Signed)
 Last A1c about 3 months ago, did show improvement but was still above goal. He does have appt with endocrinology in about 10 days. Continues with Trulicity  and 75/25 insulin . Due for recheck of A1c, does have appt with endo upcoming, suspect they will check in office and thus will defer for now. No changes to medications today Complete foot exam at next visit

## 2024-05-05 ENCOUNTER — Encounter: Payer: Self-pay | Admitting: *Deleted

## 2024-05-05 ENCOUNTER — Telehealth (INDEPENDENT_AMBULATORY_CARE_PROVIDER_SITE_OTHER): Payer: Self-pay

## 2024-05-05 NOTE — Telephone Encounter (Signed)
 Patient left a message stating that order for leg strengthen with physical therapy is not being follow up at facility. Patient is requesting for orders to sent to him. I left a message on patient voicemail to return call to the office.

## 2024-05-11 DIAGNOSIS — N1832 Chronic kidney disease, stage 3b: Secondary | ICD-10-CM | POA: Diagnosis not present

## 2024-05-11 DIAGNOSIS — R8290 Unspecified abnormal findings in urine: Secondary | ICD-10-CM | POA: Diagnosis not present

## 2024-05-13 ENCOUNTER — Encounter: Payer: Self-pay | Admitting: "Endocrinology

## 2024-05-13 ENCOUNTER — Ambulatory Visit (INDEPENDENT_AMBULATORY_CARE_PROVIDER_SITE_OTHER): Admitting: "Endocrinology

## 2024-05-13 VITALS — BP 112/80 | HR 98 | Ht 70.0 in | Wt 342.0 lb

## 2024-05-13 DIAGNOSIS — Z794 Long term (current) use of insulin: Secondary | ICD-10-CM | POA: Diagnosis not present

## 2024-05-13 DIAGNOSIS — Z7985 Long-term (current) use of injectable non-insulin antidiabetic drugs: Secondary | ICD-10-CM

## 2024-05-13 DIAGNOSIS — E1165 Type 2 diabetes mellitus with hyperglycemia: Secondary | ICD-10-CM

## 2024-05-13 LAB — POCT GLYCOSYLATED HEMOGLOBIN (HGB A1C): Hemoglobin A1C: 6.7 % — AB (ref 4.0–5.6)

## 2024-05-13 MED ORDER — DEXCOM G7 SENSOR MISC
1.0000 | 0 refills | Status: DC
Start: 2024-05-13 — End: 2024-08-04

## 2024-05-13 MED ORDER — FREESTYLE LIBRE 3 PLUS SENSOR MISC: 3 refills | Status: DC

## 2024-05-13 NOTE — Progress Notes (Signed)
 Outpatient Endocrinology Note Kevin Birmingham, MD  05/13/24   Darryon Bastin Scavone [birth year] [id number]  Referring Provider: de Peru, Raymond J, MD Primary Care Provider: de Peru, Raymond J, MD Reason for consultation: Subjective   Assessment & Plan  Diagnoses and all orders for this visit:  Uncontrolled type 2 diabetes mellitus with hyperglycemia, with long-term current use of insulin  (HCC) -     POCT glycosylated hemoglobin (Hb A1C) -     Microalbumin / creatinine urine ratio -     Lipid panel  Long-term (current) use of injectable non-insulin  antidiabetic drugs  Other orders -     Continuous Glucose Sensor (FREESTYLE LIBRE 3 PLUS SENSOR) MISC; Change sensor every 15 days. -     Continuous Glucose Sensor (DEXCOM G7 SENSOR) MISC; 1 Device by Does not apply route continuous.   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  Cut down the dose by 5 to 10 units if blood sugars are less than 150 Ordered both Dexcom/libre 3+, patient will pick up whichever is covered by the insurance  Although low GFR, cannot start SGLT2 1 because patient thinks he may have a urine infection right now, has been checked in the rehab but I do not have the results No known contraindications/side effects to any of above medications  -Last LD and Tg are as follows: Lab Results  Component Value Date   LDLCALC 46 04/04/2021    Lab Results  Component Value Date   TRIG 61 04/04/2021   -not on statin, ordered fasting lab -Follow low fat diet and exercise   -Blood  pressure goal <140/90 - Microalbumin/creatinine goal is < 30 -Last MA/Cr is as follows: Lab Results  Component Value Date   MICROALBUR 11.2 04/04/2021   -not on ACE/ARB, ordered MA/Cr ratio -diet changes including salt restriction -limit eating outside -counseled BP targets per standards of diabetes care -uncontrolled blood pressure can lead to retinopathy, nephropathy and cardiovascular and atherosclerotic heart disease  Reviewed and counseled on: -A1C target -Blood sugar targets -Complications of uncontrolled diabetes  -Checking blood sugar before meals and bedtime and bring log next visit -All medications with mechanism of action and side effects -Hypoglycemia management: rule of 15's, Glucagon Emergency Kit and medical alert ID -low-carb low-fat plate-method diet -At least 20 minutes of physical activity per day -Annual dilated retinal eye exam and foot exam -compliance and follow up needs -follow up as scheduled or earlier if problem gets worse  Call if blood sugar is less than 70 or consistently above 250    Take a 15 gm snack of carbohydrate at bedtime before you go to sleep if your blood sugar is less than 100.    If you are going to fast after midnight for a test or procedure, ask your physician for instructions on how to reduce/decrease your insulin  dose.    Call if blood sugar is less than 70 or consistently above 250  -Treating a low sugar by rule of 15  (15 gms of sugar  every 15 min until sugar is more than 70) If you feel your sugar is low, test your sugar to be sure If your sugar is low (less than 70), then take 15 grams of a fast acting Carbohydrate (3-4 glucose tablets or glucose gel or 4 ounces of juice or regular soda) Recheck your sugar 15 min after treating low to make sure it is more than 70 If sugar is still less than 70, treat again with 15 grams of carbohydrate          Don't drive the hour of hypoglycemia  If unconscious/unable to eat or drink by  mouth, use glucagon injection or nasal spray baqsimi and call 911. Can repeat again in 15 min if still unconscious.  Return in about 3 months (around 08/13/2024) for visit and fasting labs the same day .   I have reviewed current medications, nurse's notes, allergies, vital signs, past medical and surgical history, family medical history, and social history for this encounter. Counseled patient on symptoms, examination findings, lab findings, imaging results, treatment decisions and monitoring and prognosis. The patient understood the recommendations and agrees with the treatment plan. All questions regarding treatment plan were fully answered.  Kevin Birmingham, MD  05/13/24   History of Present Illness Random Dobrowski is a 73 y.o. year old male who presents for evaluation of Type II diabetes mellitus.  Sriansh Farra Azucena was first diagnosed in ? (Patient could not answer) Diabetes education +  Home diabetes regimen: Trulicity  4.5 mg weekly Humalog  75/25 units: 42 units bid   COMPLICATIONS -  MI/Stroke -  retinopathy -  neuropathy +  nephropathy  SYMPTOMS REVIEWED - Polyuria - Weight loss - Blurred vision  BLOOD SUGAR DATA 112-242 qam, 123-300 qpm   Physical Exam  BP 112/80   Pulse 98   Ht 5' 10 (1.778 m)   Wt (!) 342 lb (155.1 kg)   SpO2 98%   BMI 49.07 kg/m    Constitutional: well developed, well nourished Head: normocephalic, atraumatic Eyes: sclera anicteric, no redness Neck: supple Lungs: normal respiratory effort Neurology: alert and oriented Skin: dry, no appreciable rashes Musculoskeletal: no appreciable defects Psychiatric: normal mood and affect Diabetic Foot Exam - Simple   No data filed      Current Medications Patient's Medications  New Prescriptions   CONTINUOUS GLUCOSE SENSOR (DEXCOM G7 SENSOR) MISC    1 Device by Does not apply route continuous.   CONTINUOUS GLUCOSE SENSOR (FREESTYLE LIBRE 3 PLUS SENSOR) MISC    Change sensor every 15  days.  Previous Medications   ANASTROZOLE (ARIMIDEX) 1 MG TABLET    Take by mouth.   APIXABAN  (ELIQUIS ) 5 MG TABS TABLET    Take 5 mg by mouth 2 (two) times daily.   B COMPLEX VITAMINS CAPSULE    Take 1 capsule by mouth every morning.   CHOLECALCIFEROL (D-3-5) 125 MCG (5000 UT) CAPSULE    Take 5,000 Units by mouth daily.   DICLOFENAC  SODIUM (VOLTAREN ) 1 % GEL    Apply topically.   DILTIAZEM  (TIAZAC ) 120 MG 24 HR CAPSULE    Take 1 capsule (120 mg total) by mouth daily.   DOCUSATE SODIUM  (COLACE) 100 MG CAPSULE    Take 100 mg by mouth 2 (two) times daily.   DULAGLUTIDE  (TRULICITY ) 3 MG/0.5ML SOPN    Inject 3 mg as directed once a week.   EPINEPHRINE 0.3 MG/0.3 ML IJ SOAJ INJECTION    SMARTSIG:1 Milligram(s) IM Daily   FEBUXOSTAT  80 MG  TABS    Take 1 tablet (80 mg total) by mouth daily.   FERROUS SULFATE 325 (65 FE) MG TABLET    Take 325 mg by mouth daily with breakfast.   FLUTICASONE  (FLONASE ) 50 MCG/ACT NASAL SPRAY    Place 2 sprays into both nostrils daily.   GLUCOSE BLOOD (ONETOUCH ULTRA) TEST STRIP    CHECK BLOOD SUGAR TWICE  DAILY   HYDROCODONE -ACETAMINOPHEN  (NORCO/VICODIN) 5-325 MG TABLET    Take 1 tablet by mouth every 6 (six) hours as needed for moderate pain (pain score 4-6).   INSULIN  LISPRO PROT & LISPRO (HUMALOG  75/25 MIX) (75-25) 100 UNIT/ML KWIKPEN    Inject 42 Units into the skin 2 (two) times daily.   INSULIN  PEN NEEDLE (B-D ULTRAFINE III SHORT PEN) 31G X 8 MM MISC    USE 1 PENNEEDLE TWICE DAILY   MICONAZOLE (MICOTIN) 2 % POWDER    Apply 1 Application topically in the morning and at bedtime. To abdominal fold.   MULTIPLE VITAMIN (MULTIVITAMIN) TABLET    Take 1 tablet by mouth daily.   NYSTATIN  POWDER    Apply topically.   ONDANSETRON  (ZOFRAN ) 8 MG TABLET    Take by mouth.   POTASSIUM CHLORIDE  SA (KLOR-CON  M20) 20 MEQ TABLET    Take one tablet three times per week.   SIMETHICONE  80 MG TABS    Take 1 tablet (80 mg total) by mouth 3 (three) times daily as needed.   TERAZOSIN   (HYTRIN ) 10 MG CAPSULE    Take 10 mg by mouth at bedtime.   TESTOSTERONE CYPIONATE (DEPOTESTOSTERONE CYPIONATE) 200 MG/ML INJECTION    Inject 0.5 mLs into the muscle once a week. On Friday.   TORSEMIDE  (DEMADEX ) 100 MG TABLET    Take 100 mg by mouth daily.   TORSEMIDE  (DEMADEX ) 20 MG TABLET    Take 40 mg by mouth 2 (two) times daily.   TRAMADOL  (ULTRAM ) 50 MG TABLET    Take 1 tablet (50 mg total) by mouth in the morning, at noon, and at bedtime.   TRULICITY  4.5 MG/0.5ML SOAJ      Modified Medications   No medications on file  Discontinued Medications   No medications on file    Allergies Allergies  Allergen Reactions   Other Other (See Comments)    Sneezing, coughing   Hydralazine  Other (See Comments)    Drowsiness/sedation/mental fog    Past Medical History Past Medical History:  Diagnosis Date   ALLERGIC RHINITIS 08/11/2006   Allergy 1989   Anxiety 2024   Became homeless   Arthritis 2020   ASTHMA 08/11/2006   Bradycardia    Beta blocker d/c'd 2022   Chronic atrial fibrillation (HCC) 08/2014   Chronic combined systolic and diastolic CHF (congestive heart failure) [facility])    Cardiology f/u 12/2017: pt volume overloaded (R heart dysf suspected), BNP very high, lasix  increased.  Repeat echo 12/2017: normal LV EF, mild DD, +RV syst dysfxn, mod pulm HTN, biatrial enlgmt.   Chronic constipation    Chronic renal insufficiency, stage 3 (moderate) (HCC)    Colon cancer screening 02/2021   02/2021 Cologuard POS->GI ref   Depression 2023   Became homeless   DIABETES MELLITUS, TYPE II 08/11/2006   Heart murmur 1990   AFib   HYPERTENSION 08/11/2006   IDA (iron deficiency anemia)    04/2022.  Iron started.  Needs hemoccults   Impaired mobility and endurance    MRSA infection 06/2018   LL venous stasis ulcer infected  Normocytic anemia 2016-2019   03/2018 B12 normal, iron ok (ferritin borderline low).   OBESITY, MORBID 12/14/2007   saxenda started 05/2020 by [facility] wt mgmt center   OSA  (obstructive sleep apnea)    to get sleep study with [facility]) as of 12/03/2018 consult.   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  [id number] ID started him on amoxil  prophylaxis---ineffective.  End 2018/Jan 2019 penicillin  G injections prophyl helpful but pt declined to continue this as of 01/2018 ID f/u.  ID talked him into resuming monthly penicillin  G as of 02/2018 f/u.     Restless leg syndrome    Rx'd clonazepam  09/2017 and pt refused to take it after reading the medication's potential side effects.   Venous stasis ulcers of both lower extremities (HCC)    Severe lymphedema.  wound clinic care ongoing as of 01/2018    Past Surgical History Past Surgical History:  Procedure Laterality Date   Carotid dopplers  07/23/2018   Left NORMAL.  Right 1-39% ICA stenosis, with <50% distal CCA stenosis (not hemodynamically significant)   LE venous dopplers Bilateral 03/06/2022   no dvt   RIGHT HEART CATH N/A 02/19/2022   Marked volume overload, R>>L HF, preserved CO. Procedure: RIGHT HEART CATH;  Surgeon: Cherrie Toribio SAUNDERS, MD;  Location: [facility] INVASIVE CV LAB;  Service: Cardiovascular;  Laterality: N/A;   TRANSTHORACIC ECHOCARDIOGRAM  11/2007; 09/2014; 11/2015;12/2017   LV fxn normal, EF normal, mild dilation of left atrium.  2015 grade II diast dysfxn.  2017 EF 55-60%. 2019: LVEF 60-65%, mild RV syst dysf,biatrial enlgmt, mod pulm htn. 02/14/22 EF 55-60%, unable to assess diast fxn, LVH, o/w normal   URETERAL STENT PLACEMENT     virtual colonoscopy  01/2011   Normal    Family History family history includes Cancer in his maternal grandmother; Dementia in his sister; Diabetes in his mother; Early death in his mother; Hearing loss in his father; Pancreatic cancer in his sister.  Social History Social History   Socioeconomic History   Marital status: Single    Spouse name: Not on file   Number of children: 1    Years of education: 14   Highest education level: Bachelor's degree (e.g., BA, AB, BS)  Occupational History   Occupation: retired  Tobacco Use   Smoking status: Never    Passive exposure: Never   Smokeless tobacco: Never  Vaping Use   Vaping status: Never Used  Substance and Sexual Activity   Alcohol use: Not Currently    Comment: Stopped late 70s   Drug use: Never   Sexual activity: Not Currently  Other Topics Concern   Not on file  Social History Narrative         Social Drivers of Health   Financial Resource Strain: Low Risk  (04/29/2024)   Overall Financial Resource Strain (CARDIA)    Difficulty of Paying Living Expenses: Not hard at all  Food Insecurity: No Food Insecurity (04/29/2024)   Hunger Vital Sign    Worried About Running Out of Food in the Last Year: Never true    Ran Out of Food in the Last Year: Never true  Transportation Needs: Unmet Transportation Needs (04/29/2024)   PRAPARE - Administrator, Civil Service (Medical): Yes    Lack of Transportation (Non-Medical): No  Physical Activity: Inactive (04/29/2024)   Exercise Vital Sign    Days of Exercise per Week: 0 days  Minutes of Exercise per Session: Not on file  Stress: No Stress Concern Present (04/29/2024)   [employer] of Occupational Health - Occupational Stress Questionnaire    Feeling of Stress: Only a little  Social Connections: Moderately Integrated (04/29/2024)   Social Connection and Isolation Panel    Frequency of Communication with Friends and Family: Three times a week    Frequency of Social Gatherings with Friends and Family: More than three times a week    Attends Religious Services: More than 4 times per year    Active Member of Clubs or Organizations: Yes    Attends Banker Meetings: More than 4 times per year    Marital Status: Never married  Intimate Partner Violence: Not At Risk (11/20/2023)   Humiliation, Afraid, Rape, and Kick questionnaire    Fear of  Current or Ex-Partner: No    Emotionally Abused: No    Physically Abused: No    Sexually Abused: No    Lab Results  Component Value Date   HGBA1C 6.7 (A) 05/13/2024   HGBA1C 8.0 (H) 01/12/2024   HGBA1C 8.4 (H) 06/26/2023   Lab Results  Component Value Date   CHOL 101 04/04/2021   Lab Results  Component Value Date   HDL 41 04/04/2021   Lab Results  Component Value Date   LDLCALC 46 04/04/2021   Lab Results  Component Value Date   TRIG 61 04/04/2021   Lab Results  Component Value Date   CHOLHDL 2.5 04/04/2021   Lab Results  Component Value Date   CREATININE 2.12 (H) 01/12/2024   Lab Results  Component Value Date   GFR 51.90 (L) 10/10/2022   Lab Results  Component Value Date   MICROALBUR 11.2 04/04/2021      Component Value Date/Time   NA 137 01/12/2024 1558   NA 139 10/24/2023 1127   K 5.3 01/12/2024 1558   CL 96 (L) 01/12/2024 1558   CO2 34 (H) 01/12/2024 1558   GLUCOSE 123 (H) 01/12/2024 1558   BUN 31 (H) 01/12/2024 1558   BUN 26 10/24/2023 1127   CREATININE 2.12 (H) 01/12/2024 1558   CALCIUM  9.1 01/12/2024 1558   PROT 7.2 01/12/2024 1558   PROT 6.4 12/23/2022 1043   ALBUMIN  2.9 (L) 06/27/2023 0446   ALBUMIN  3.5 (L) 12/23/2022 1043   AST 18 01/12/2024 1558   ALT 16 01/12/2024 1558   ALKPHOS 119 12/23/2022 1043   BILITOT 0.7 01/12/2024 1558   BILITOT 0.6 12/23/2022 1043   GFRNONAA 39 (L) 06/30/2023 0555   GFRAA 63 02/09/2020 1453      Latest Ref Rng & Units 01/12/2024    3:58 PM 10/24/2023   11:27 AM 06/30/2023    5:55 AM  BMP  Glucose 65 - 99 mg/dL 876  826  820   BUN 7 - 25 mg/dL 31  26  43   Creatinine 0.70 - 1.28 mg/dL 7.87  8.03  8.17   BUN/Creat Ratio 6 - 22 (calc) 15  13    Sodium 135 - 146 mmol/L 137  139  132   Potassium 3.5 - 5.3 mmol/L 5.3  5.2  3.8   Chloride 98 - 110 mmol/L 96  98  94   CO2 20 - 32 mmol/L 34  24  31   Calcium  8.6 - 10.3 mg/dL 9.1  8.8  8.2        Component Value Date/Time   WBC 8.6 10/24/2023 1127   WBC  9.9 06/26/2023 1251  RBC 6.06 (H) 10/24/2023 1127   RBC 6.82 (H) 06/26/2023 1251   HGB 17.9 (H) 10/24/2023 1127   HCT 55.9 (H) 10/24/2023 1127   PLT 218 10/24/2023 1127   MCV 92 10/24/2023 1127   MCH 29.5 10/24/2023 1127   MCH 24.2 (L) 06/26/2023 1251   MCHC 32.0 10/24/2023 1127   MCHC 32.2 06/26/2023 1251   RDW 13.5 10/24/2023 1127   LYMPHSABS 1.2 06/26/2023 1251   MONOABS 1.1 (H) 06/26/2023 1251   EOSABS 0.3 06/26/2023 1251   BASOSABS 0.1 06/26/2023 1251     Parts of this note may have been dictated using voice recognition software. There may be variances in spelling and vocabulary which are unintentional. Not all errors are proofread. Please notify the dino if any discrepancies are noted or if the meaning of any statement is not clear.

## 2024-05-17 DIAGNOSIS — E1165 Type 2 diabetes mellitus with hyperglycemia: Secondary | ICD-10-CM | POA: Diagnosis not present

## 2024-05-17 DIAGNOSIS — N1832 Chronic kidney disease, stage 3b: Secondary | ICD-10-CM | POA: Diagnosis not present

## 2024-05-17 DIAGNOSIS — E1121 Type 2 diabetes mellitus with diabetic nephropathy: Secondary | ICD-10-CM | POA: Diagnosis not present

## 2024-05-18 DIAGNOSIS — I509 Heart failure, unspecified: Secondary | ICD-10-CM | POA: Diagnosis not present

## 2024-05-18 DIAGNOSIS — I4891 Unspecified atrial fibrillation: Secondary | ICD-10-CM | POA: Diagnosis not present

## 2024-05-18 DIAGNOSIS — E1122 Type 2 diabetes mellitus with diabetic chronic kidney disease: Secondary | ICD-10-CM | POA: Diagnosis not present

## 2024-05-18 DIAGNOSIS — R262 Difficulty in walking, not elsewhere classified: Secondary | ICD-10-CM | POA: Diagnosis not present

## 2024-05-18 DIAGNOSIS — M6281 Muscle weakness (generalized): Secondary | ICD-10-CM | POA: Diagnosis not present

## 2024-05-19 DIAGNOSIS — R262 Difficulty in walking, not elsewhere classified: Secondary | ICD-10-CM | POA: Diagnosis not present

## 2024-05-19 DIAGNOSIS — M6281 Muscle weakness (generalized): Secondary | ICD-10-CM | POA: Diagnosis not present

## 2024-05-20 ENCOUNTER — Telehealth (HOSPITAL_BASED_OUTPATIENT_CLINIC_OR_DEPARTMENT_OTHER): Payer: Self-pay | Admitting: *Deleted

## 2024-05-20 DIAGNOSIS — R262 Difficulty in walking, not elsewhere classified: Secondary | ICD-10-CM | POA: Diagnosis not present

## 2024-05-20 DIAGNOSIS — M6281 Muscle weakness (generalized): Secondary | ICD-10-CM | POA: Diagnosis not present

## 2024-05-20 NOTE — Telephone Encounter (Signed)
 Copied from CRM [id number]. Topic: Clinical - Medical Advice >> May 20, 2024  2:04 PM Sophia H wrote: Reason for CRM: Patient states he is retaining a lot of fluid and gaining almost a pound a day, wanting to know if he should come in and speak with Dr. De Peru or if he should see another specialist? Please advise # [phone]

## 2024-05-20 NOTE — Telephone Encounter (Signed)
**Note De-identified  [place] Obfuscation** Please advise 

## 2024-05-21 ENCOUNTER — Ambulatory Visit: Payer: Self-pay | Admitting: *Deleted

## 2024-05-21 DIAGNOSIS — M6281 Muscle weakness (generalized): Secondary | ICD-10-CM | POA: Diagnosis not present

## 2024-05-21 DIAGNOSIS — R262 Difficulty in walking, not elsewhere classified: Secondary | ICD-10-CM | POA: Diagnosis not present

## 2024-05-21 NOTE — Telephone Encounter (Signed)
 Called and spoke with pt letting him know that recommendation by Thersia Stark, FNP is urgent care. Pt verbalized understanding. Nothing further needed.

## 2024-05-21 NOTE — Telephone Encounter (Signed)
**Note Kevin-Identified via Obfuscation**  FYI Only or Action Required?: Action required by provider: request for appointment and clinical question for provider.  Patient was last seen in primary care on 05/03/2024 by [name], Kevin PARAS, MD.  Called Nurse Triage reporting Leg Swelling.  Symptoms began several weeks ago.  Interventions attempted: Prescription medications: lasix .  Symptoms are: gradually worsening.  Triage Disposition: See HCP Within 4 Hours (Or PCP Triage)  Patient/caregiver understands and will follow disposition?: No, wishes to speak with PCP            Copied from CRM [id number]. Topic: Clinical - Medical Advice >> May 21, 2024 11:16 AM Winona SAUNDERS wrote:  2:04 PM [name] wrote: Reason for CRM: Patient states he is retaining a lot of fluid and gaining almost a pound a day, wanting to know if he should come in and speak with Dr. De [name] or if he should see another specialist? Pt also has lymphedema. Never received a call back from his call yesterday afternoon. Reason for Disposition  SEVERE leg swelling (e.g., swelling extends above knee, entire leg is swollen, weeping fluid)  Answer Assessment - Initial Assessment Questions Recommended UC or ED . No available appt with PCP or other providers today. Patient reports he needs 3 days notice for transportation. Recommended call 911 if sx worsen. Patient reports he does not want to go to ED and will continue to monitor sx. Recommended to call cardiologist , elevate legs throughout day and continue taking medication as directed.       1. ONSET: When did the swelling start? (e.g., minutes, hours, days)     Gaining weight daily for approx. 1 week  2. LOCATION: What part of the leg is swollen?  Are both legs swollen or just one leg?     Up to thighs  3. SEVERITY: How bad is the swelling? (e.g., localized; mild, moderate, severe)     Moderate  4. REDNESS: Is there redness or signs of infection?     na 5. PAIN: Is the swelling painful to touch? If  Yes, ask: How painful is it?   (Scale 1-10; mild, moderate or severe)     Mild pain  6. FEVER: Do you have a fever? If Yes, ask: What is it, how was it measured, and when did it start?      na 7. CAUSE: What do you think is causing the leg swelling?     Na  8. MEDICAL HISTORY: Do you have a history of blood clots (e.g., DVT), cancer, heart failure, kidney disease, or liver failure?     Hx heart issues  9. RECURRENT SYMPTOM: Have you had leg swelling before? If Yes, ask: When was the last time? What happened that time?     Yes  10. OTHER SYMPTOMS: Do you have any other symptoms? (e.g., chest pain, difficulty breathing)       Denies chest pain no difficulty breathing. Reports gaining a pound a day and now thighs are becoming swollen pitting edema noted .  11. PREGNANCY: Is there any chance you are pregnant? When was your last menstrual period?       na  Protocols used: Leg Swelling and Edema-A-AH

## 2024-05-24 ENCOUNTER — Telehealth (HOSPITAL_BASED_OUTPATIENT_CLINIC_OR_DEPARTMENT_OTHER): Payer: Self-pay | Admitting: *Deleted

## 2024-05-24 ENCOUNTER — Ambulatory Visit (HOSPITAL_BASED_OUTPATIENT_CLINIC_OR_DEPARTMENT_OTHER): Admitting: Family Medicine

## 2024-05-24 DIAGNOSIS — E876 Hypokalemia: Secondary | ICD-10-CM | POA: Diagnosis not present

## 2024-05-24 DIAGNOSIS — I130 Hypertensive heart and chronic kidney disease with heart failure and stage 1 through stage 4 chronic kidney disease, or unspecified chronic kidney disease: Secondary | ICD-10-CM | POA: Diagnosis not present

## 2024-05-24 DIAGNOSIS — I10 Essential (primary) hypertension: Secondary | ICD-10-CM | POA: Diagnosis not present

## 2024-05-24 DIAGNOSIS — R262 Difficulty in walking, not elsewhere classified: Secondary | ICD-10-CM | POA: Diagnosis not present

## 2024-05-24 DIAGNOSIS — M6281 Muscle weakness (generalized): Secondary | ICD-10-CM | POA: Diagnosis not present

## 2024-05-24 DIAGNOSIS — E1121 Type 2 diabetes mellitus with diabetic nephropathy: Secondary | ICD-10-CM | POA: Diagnosis not present

## 2024-05-24 NOTE — Telephone Encounter (Signed)
 Copied from CRM [id number]. Topic: Appointments - Appointment Scheduling >> May 24, 2024 12:17 PM [name] wrote: The patient called in to schedule a 4 month follow up as his previous appointment had to be cancelled because the provider was out. I will assist with scheduling and confirm the details. After checking the appointments were out too far and he has also declined to be triaged at this time. He said he will call his nephrologist.

## 2024-05-25 DIAGNOSIS — D649 Anemia, unspecified: Secondary | ICD-10-CM | POA: Diagnosis not present

## 2024-05-25 DIAGNOSIS — L03115 Cellulitis of right lower limb: Secondary | ICD-10-CM | POA: Diagnosis not present

## 2024-05-25 DIAGNOSIS — I509 Heart failure, unspecified: Secondary | ICD-10-CM | POA: Diagnosis not present

## 2024-05-25 DIAGNOSIS — R7989 Other specified abnormal findings of blood chemistry: Secondary | ICD-10-CM | POA: Diagnosis not present

## 2024-05-25 DIAGNOSIS — N183 Chronic kidney disease, stage 3 unspecified: Secondary | ICD-10-CM | POA: Diagnosis not present

## 2024-05-25 DIAGNOSIS — L03116 Cellulitis of left lower limb: Secondary | ICD-10-CM | POA: Diagnosis not present

## 2024-05-26 DIAGNOSIS — R262 Difficulty in walking, not elsewhere classified: Secondary | ICD-10-CM | POA: Diagnosis not present

## 2024-05-26 DIAGNOSIS — M6281 Muscle weakness (generalized): Secondary | ICD-10-CM | POA: Diagnosis not present

## 2024-05-31 DIAGNOSIS — M6281 Muscle weakness (generalized): Secondary | ICD-10-CM | POA: Diagnosis not present

## 2024-05-31 DIAGNOSIS — R262 Difficulty in walking, not elsewhere classified: Secondary | ICD-10-CM | POA: Diagnosis not present

## 2024-06-02 DIAGNOSIS — R262 Difficulty in walking, not elsewhere classified: Secondary | ICD-10-CM | POA: Diagnosis not present

## 2024-06-02 DIAGNOSIS — M6281 Muscle weakness (generalized): Secondary | ICD-10-CM | POA: Diagnosis not present

## 2024-06-04 DIAGNOSIS — M6281 Muscle weakness (generalized): Secondary | ICD-10-CM | POA: Diagnosis not present

## 2024-06-04 DIAGNOSIS — R262 Difficulty in walking, not elsewhere classified: Secondary | ICD-10-CM | POA: Diagnosis not present

## 2024-06-07 DIAGNOSIS — R262 Difficulty in walking, not elsewhere classified: Secondary | ICD-10-CM | POA: Diagnosis not present

## 2024-06-08 DIAGNOSIS — R262 Difficulty in walking, not elsewhere classified: Secondary | ICD-10-CM | POA: Diagnosis not present

## 2024-06-08 DIAGNOSIS — M6281 Muscle weakness (generalized): Secondary | ICD-10-CM | POA: Diagnosis not present

## 2024-06-09 DIAGNOSIS — R7989 Other specified abnormal findings of blood chemistry: Secondary | ICD-10-CM | POA: Diagnosis not present

## 2024-06-09 DIAGNOSIS — R1900 Intra-abdominal and pelvic swelling, mass and lump, unspecified site: Secondary | ICD-10-CM | POA: Diagnosis not present

## 2024-06-09 DIAGNOSIS — R262 Difficulty in walking, not elsewhere classified: Secondary | ICD-10-CM | POA: Diagnosis not present

## 2024-06-09 DIAGNOSIS — I5033 Acute on chronic diastolic (congestive) heart failure: Secondary | ICD-10-CM | POA: Diagnosis not present

## 2024-06-10 DIAGNOSIS — Z7409 Other reduced mobility: Secondary | ICD-10-CM | POA: Diagnosis not present

## 2024-06-10 DIAGNOSIS — I130 Hypertensive heart and chronic kidney disease with heart failure and stage 1 through stage 4 chronic kidney disease, or unspecified chronic kidney disease: Secondary | ICD-10-CM | POA: Diagnosis not present

## 2024-06-10 DIAGNOSIS — M6281 Muscle weakness (generalized): Secondary | ICD-10-CM | POA: Diagnosis not present

## 2024-06-10 DIAGNOSIS — R262 Difficulty in walking, not elsewhere classified: Secondary | ICD-10-CM | POA: Diagnosis not present

## 2024-06-11 DIAGNOSIS — L03314 Cellulitis of groin: Secondary | ICD-10-CM | POA: Diagnosis not present

## 2024-06-11 DIAGNOSIS — R7989 Other specified abnormal findings of blood chemistry: Secondary | ICD-10-CM | POA: Diagnosis not present

## 2024-06-11 DIAGNOSIS — B49 Unspecified mycosis: Secondary | ICD-10-CM | POA: Diagnosis not present

## 2024-06-11 DIAGNOSIS — M6281 Muscle weakness (generalized): Secondary | ICD-10-CM | POA: Diagnosis not present

## 2024-06-11 DIAGNOSIS — I5033 Acute on chronic diastolic (congestive) heart failure: Secondary | ICD-10-CM | POA: Diagnosis not present

## 2024-06-11 DIAGNOSIS — R262 Difficulty in walking, not elsewhere classified: Secondary | ICD-10-CM | POA: Diagnosis not present

## 2024-06-16 DIAGNOSIS — M6281 Muscle weakness (generalized): Secondary | ICD-10-CM | POA: Diagnosis not present

## 2024-06-16 DIAGNOSIS — R262 Difficulty in walking, not elsewhere classified: Secondary | ICD-10-CM | POA: Diagnosis not present

## 2024-06-21 DIAGNOSIS — G8929 Other chronic pain: Secondary | ICD-10-CM | POA: Diagnosis not present

## 2024-06-21 DIAGNOSIS — M109 Gout, unspecified: Secondary | ICD-10-CM | POA: Diagnosis not present

## 2024-06-21 DIAGNOSIS — I739 Peripheral vascular disease, unspecified: Secondary | ICD-10-CM | POA: Diagnosis not present

## 2024-06-22 DIAGNOSIS — I4891 Unspecified atrial fibrillation: Secondary | ICD-10-CM | POA: Diagnosis not present

## 2024-06-22 DIAGNOSIS — I129 Hypertensive chronic kidney disease with stage 1 through stage 4 chronic kidney disease, or unspecified chronic kidney disease: Secondary | ICD-10-CM | POA: Diagnosis not present

## 2024-06-22 DIAGNOSIS — E1122 Type 2 diabetes mellitus with diabetic chronic kidney disease: Secondary | ICD-10-CM | POA: Diagnosis not present

## 2024-06-22 DIAGNOSIS — I890 Lymphedema, not elsewhere classified: Secondary | ICD-10-CM | POA: Diagnosis not present

## 2024-06-22 DIAGNOSIS — Z794 Long term (current) use of insulin: Secondary | ICD-10-CM | POA: Diagnosis not present

## 2024-06-30 DIAGNOSIS — I509 Heart failure, unspecified: Secondary | ICD-10-CM | POA: Diagnosis not present

## 2024-06-30 DIAGNOSIS — N183 Chronic kidney disease, stage 3 unspecified: Secondary | ICD-10-CM | POA: Diagnosis not present

## 2024-06-30 DIAGNOSIS — L03116 Cellulitis of left lower limb: Secondary | ICD-10-CM | POA: Diagnosis not present

## 2024-07-06 DIAGNOSIS — I509 Heart failure, unspecified: Secondary | ICD-10-CM | POA: Diagnosis not present

## 2024-07-06 DIAGNOSIS — M25569 Pain in unspecified knee: Secondary | ICD-10-CM | POA: Diagnosis not present

## 2024-07-06 DIAGNOSIS — N181 Chronic kidney disease, stage 1: Secondary | ICD-10-CM | POA: Diagnosis not present

## 2024-07-06 DIAGNOSIS — R7309 Other abnormal glucose: Secondary | ICD-10-CM | POA: Diagnosis not present

## 2024-07-06 DIAGNOSIS — L03116 Cellulitis of left lower limb: Secondary | ICD-10-CM | POA: Diagnosis not present

## 2024-07-06 DIAGNOSIS — N183 Chronic kidney disease, stage 3 unspecified: Secondary | ICD-10-CM | POA: Diagnosis not present

## 2024-07-06 DIAGNOSIS — M25511 Pain in right shoulder: Secondary | ICD-10-CM | POA: Diagnosis not present

## 2024-07-06 DIAGNOSIS — E559 Vitamin D deficiency, unspecified: Secondary | ICD-10-CM | POA: Diagnosis not present

## 2024-07-07 DIAGNOSIS — L03116 Cellulitis of left lower limb: Secondary | ICD-10-CM | POA: Diagnosis not present

## 2024-07-07 DIAGNOSIS — N183 Chronic kidney disease, stage 3 unspecified: Secondary | ICD-10-CM | POA: Diagnosis not present

## 2024-07-07 DIAGNOSIS — I509 Heart failure, unspecified: Secondary | ICD-10-CM | POA: Diagnosis not present

## 2024-07-12 ENCOUNTER — Encounter (INDEPENDENT_AMBULATORY_CARE_PROVIDER_SITE_OTHER): Payer: Self-pay | Admitting: Vascular Surgery

## 2024-07-12 ENCOUNTER — Ambulatory Visit (INDEPENDENT_AMBULATORY_CARE_PROVIDER_SITE_OTHER): Admitting: Vascular Surgery

## 2024-07-12 VITALS — BP 117/68 | HR 74 | Wt 340.0 lb

## 2024-07-12 DIAGNOSIS — I4819 Other persistent atrial fibrillation: Secondary | ICD-10-CM | POA: Diagnosis not present

## 2024-07-12 DIAGNOSIS — I890 Lymphedema, not elsewhere classified: Secondary | ICD-10-CM

## 2024-07-12 DIAGNOSIS — E1165 Type 2 diabetes mellitus with hyperglycemia: Secondary | ICD-10-CM | POA: Diagnosis not present

## 2024-07-12 DIAGNOSIS — I10 Essential (primary) hypertension: Secondary | ICD-10-CM | POA: Diagnosis not present

## 2024-07-12 DIAGNOSIS — Z794 Long term (current) use of insulin: Secondary | ICD-10-CM

## 2024-07-12 DIAGNOSIS — I872 Venous insufficiency (chronic) (peripheral): Secondary | ICD-10-CM | POA: Diagnosis not present

## 2024-07-12 NOTE — Progress Notes (Unsigned)
 MRN : [id number]  [name] is a 73 y.o. ([birth year]) male who presents with chief complaint of legs swell.  History of Present Illness: ***  No outpatient medications have been marked as taking for the 07/12/24 encounter (Appointment) with Jama, Cordella MATSU, MD.   Current Facility-Administered Medications for the 07/12/24 encounter (Appointment) with Jama, Cordella MATSU, MD  Medication   bupivacaine  (MARCAINE ) 0.5 % (with pres) injection 3 mL    Past Medical History:  Diagnosis Date   ALLERGIC RHINITIS 08/11/2006   Allergy 1989   Anxiety 2024   Became homeless   Arthritis 2020   ASTHMA 08/11/2006   Bradycardia    Beta blocker d/c'd 2022   Chronic atrial fibrillation (HCC) 08/2014   Chronic combined systolic and diastolic CHF (congestive heart failure) [facility])    Cardiology f/u 12/2017: pt volume overloaded (R heart dysf suspected), BNP very high, lasix  increased.  Repeat echo 12/2017: normal LV EF, mild DD, +RV syst dysfxn, mod pulm HTN, biatrial enlgmt.   Chronic constipation    Chronic renal insufficiency, stage 3 (moderate) (HCC)    Colon cancer screening 02/2021   02/2021 Cologuard POS->GI ref   Depression 2023   Became homeless   DIABETES MELLITUS, TYPE II 08/11/2006   Heart murmur 1990   AFib   HYPERTENSION 08/11/2006   IDA (iron deficiency anemia)    04/2022.  Iron started.  Needs hemoccults   Impaired mobility and endurance    MRSA infection 06/2018   LL venous stasis ulcer infected   Normocytic anemia 2016-2019   03/2018 B12 normal, iron ok (ferritin borderline low).   OBESITY, MORBID 12/14/2007   saxenda started 05/2020 by [facility] wt mgmt center   OSA (obstructive sleep apnea)    to get sleep study with [facility]) as of 12/03/2018 consult.   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  [id number] ID started him on amoxil  prophylaxis---ineffective.  End  2018/Jan 2019 penicillin  G injections prophyl helpful but pt declined to continue this as of 01/2018 ID f/u.  ID talked him into resuming monthly penicillin  G as of 02/2018 f/u.     Restless leg syndrome    Rx'd clonazepam  09/2017 and pt refused to take it after reading the medication's potential side effects.   Venous stasis ulcers of both lower extremities (HCC)    Severe lymphedema.  wound clinic care ongoing as of 01/2018    Past Surgical History:  Procedure Laterality Date   Carotid dopplers  07/23/2018   Left NORMAL.  Right 1-39% ICA stenosis, with <50% distal CCA stenosis (not hemodynamically significant)   LE venous dopplers Bilateral 03/06/2022   no dvt   RIGHT HEART CATH N/A 02/19/2022   Marked volume overload, R>>L HF, preserved CO. Procedure: RIGHT HEART CATH;  Surgeon: Cherrie Toribio SAUNDERS, MD;  Location: [facility] INVASIVE CV LAB;  Service: Cardiovascular;  Laterality: N/A;   TRANSTHORACIC ECHOCARDIOGRAM  11/2007; 09/2014; 11/2015;12/2017   LV fxn normal, EF normal, mild dilation of left atrium.  2015 grade II diast dysfxn.  2017 EF 55-60%. 2019: LVEF 60-65%, mild RV syst dysf,biatrial enlgmt, mod pulm htn. 02/14/22 EF 55-60%, unable to assess diast fxn, LVH, o/w normal   URETERAL STENT PLACEMENT     virtual colonoscopy  01/2011   Normal  Social History Social History   Tobacco Use   Smoking status: Never    Passive exposure: Never   Smokeless tobacco: Never  Vaping Use   Vaping status: Never Used  Substance Use Topics   Alcohol use: Not Currently    Comment: Stopped late 70s   Drug use: Never    Family History Family History  Problem Relation Age of Onset   Diabetes Mother    Early death Mother    Hearing loss Father    Dementia Sister    Pancreatic cancer Sister    Cancer Maternal Grandmother     Allergies  Allergen Reactions   Other Other (See Comments)    Sneezing, coughing   Hydralazine  Other (See Comments)    Drowsiness/sedation/mental fog     REVIEW OF  SYSTEMS (Negative unless checked)  Constitutional: [] Weight loss  [] Fever  [] Chills Cardiac: [] Chest pain   [] Chest pressure   [] Palpitations   [] Shortness of breath when laying flat   [] Shortness of breath with exertion. Vascular:  [] Pain in legs with walking   [x] Pain in legs with standing  [] History of DVT   [] Phlebitis   [x] Swelling in legs   [] Varicose veins   [] Non-healing ulcers Pulmonary:   [] Uses home oxygen    [] Productive cough   [] Hemoptysis   [] Wheeze  [] COPD   [] Asthma Neurologic:  [] Dizziness   [] Seizures   [] History of stroke   [] History of TIA  [] Aphasia   [] Vissual changes   [] Weakness or numbness in arm   [] Weakness or numbness in leg Musculoskeletal:   [] Joint swelling   [] Joint pain   [] Low back pain Hematologic:  [] Easy bruising  [] Easy bleeding   [] Hypercoagulable state   [] Anemic Gastrointestinal:  [] Diarrhea   [] Vomiting  [] Gastroesophageal reflux/heartburn   [] Difficulty swallowing. Genitourinary:  [] Chronic kidney disease   [] Difficult urination  [] Frequent urination   [] Blood in urine Skin:  [] Rashes   [] Ulcers  Psychological:  [] History of anxiety   []  History of major depression.  Physical Examination  There were no vitals filed for this visit. There is no height or weight on file to calculate BMI. Gen: WD/WN, NAD Head: [place]/AT, No temporalis wasting.  Ear/Nose/Throat: Hearing grossly intact, nares w/o erythema or drainage, pinna without lesions Eyes: PER, EOMI, sclera nonicteric.  Neck: Supple, no gross masses.  No JVD.  Pulmonary:  Good air movement, no audible wheezing, no use of accessory muscles.  Cardiac: RRR, precordium not hyperdynamic. Vascular:  scattered varicosities present bilaterally.  Mild venous stasis changes to the legs bilaterally.  3-4+ soft pitting edema, CEAP [id number]  Vessel Right Left  Radial Palpable Palpable  Gastrointestinal: soft, non-distended. No guarding/no peritoneal signs.  Musculoskeletal: M/S 5/5 throughout.  No deformity.   Neurologic: CN 2-12 intact. Pain and light touch intact in extremities.  Symmetrical.  Speech is fluent. Motor exam as listed above. Psychiatric: Judgment intact, Mood & affect appropriate for pt's clinical situation. Dermatologic: Venous rashes no ulcers noted.  No changes consistent with cellulitis. Lymph : No lichenification or skin changes of chronic lymphedema.  CBC Lab Results  Component Value Date   WBC 8.6 10/24/2023   HGB 17.9 (H) 10/24/2023   HCT 55.9 (H) 10/24/2023   MCV 92 10/24/2023   PLT 218 10/24/2023    BMET    Component Value Date/Time   NA 137 01/12/2024 1558   NA 139 10/24/2023 1127   K 5.3 01/12/2024 1558   CL 96 (L) 01/12/2024 1558   CO2 34 (H) 01/12/2024  1558   GLUCOSE 123 (H) 01/12/2024 1558   BUN 31 (H) 01/12/2024 1558   BUN 26 10/24/2023 1127   CREATININE 2.12 (H) 01/12/2024 1558   CALCIUM  9.1 01/12/2024 1558   GFRNONAA 39 (L) 06/30/2023 0555   GFRAA 63 02/09/2020 1453   CrCl cannot be calculated (Patient's most recent lab result is older than the maximum 21 days allowed.).  COAG No results found for: INR, PROTIME  Radiology No results found.   Assessment/Plan There are no diagnoses linked to this encounter.   Cordella Shawl, MD  07/12/2024 1:16 PM

## 2024-07-13 DIAGNOSIS — M109 Gout, unspecified: Secondary | ICD-10-CM | POA: Diagnosis not present

## 2024-07-13 DIAGNOSIS — N1832 Chronic kidney disease, stage 3b: Secondary | ICD-10-CM | POA: Diagnosis not present

## 2024-07-13 DIAGNOSIS — N181 Chronic kidney disease, stage 1: Secondary | ICD-10-CM | POA: Diagnosis not present

## 2024-07-13 DIAGNOSIS — E1122 Type 2 diabetes mellitus with diabetic chronic kidney disease: Secondary | ICD-10-CM | POA: Diagnosis not present

## 2024-07-15 ENCOUNTER — Telehealth (INDEPENDENT_AMBULATORY_CARE_PROVIDER_SITE_OTHER): Payer: Self-pay

## 2024-07-15 NOTE — Telephone Encounter (Signed)
 Annesa with [facility] reach out needing clarification if patient will need to continue doxycycline . I spoke with Dr Jama and he recommended for the patient continue 1 more week of the antibiotic. [facility] rehab will put order in for patient

## 2024-07-17 ENCOUNTER — Encounter (INDEPENDENT_AMBULATORY_CARE_PROVIDER_SITE_OTHER): Payer: Self-pay | Admitting: Vascular Surgery

## 2024-07-19 DIAGNOSIS — B351 Tinea unguium: Secondary | ICD-10-CM | POA: Diagnosis not present

## 2024-07-19 DIAGNOSIS — E1159 Type 2 diabetes mellitus with other circulatory complications: Secondary | ICD-10-CM | POA: Diagnosis not present

## 2024-07-21 DIAGNOSIS — L03116 Cellulitis of left lower limb: Secondary | ICD-10-CM | POA: Diagnosis not present

## 2024-07-21 DIAGNOSIS — N1832 Chronic kidney disease, stage 3b: Secondary | ICD-10-CM | POA: Diagnosis not present

## 2024-07-21 DIAGNOSIS — I5033 Acute on chronic diastolic (congestive) heart failure: Secondary | ICD-10-CM | POA: Diagnosis not present

## 2024-07-26 LAB — LAB REPORT - SCANNED: EGFR: 34

## 2024-07-27 DIAGNOSIS — N1832 Chronic kidney disease, stage 3b: Secondary | ICD-10-CM | POA: Diagnosis not present

## 2024-07-28 DIAGNOSIS — N183 Chronic kidney disease, stage 3 unspecified: Secondary | ICD-10-CM | POA: Diagnosis not present

## 2024-07-28 DIAGNOSIS — R600 Localized edema: Secondary | ICD-10-CM | POA: Diagnosis not present

## 2024-08-02 DIAGNOSIS — Z794 Long term (current) use of insulin: Secondary | ICD-10-CM | POA: Diagnosis not present

## 2024-08-02 DIAGNOSIS — R600 Localized edema: Secondary | ICD-10-CM | POA: Diagnosis not present

## 2024-08-02 DIAGNOSIS — E0865 Diabetes mellitus due to underlying condition with hyperglycemia: Secondary | ICD-10-CM | POA: Diagnosis not present

## 2024-08-02 DIAGNOSIS — N1832 Chronic kidney disease, stage 3b: Secondary | ICD-10-CM | POA: Diagnosis not present

## 2024-08-02 DIAGNOSIS — I5033 Acute on chronic diastolic (congestive) heart failure: Secondary | ICD-10-CM | POA: Diagnosis not present

## 2024-08-03 DIAGNOSIS — I10 Essential (primary) hypertension: Secondary | ICD-10-CM | POA: Diagnosis not present

## 2024-08-03 DIAGNOSIS — Z136 Encounter for screening for cardiovascular disorders: Secondary | ICD-10-CM | POA: Diagnosis not present

## 2024-08-03 DIAGNOSIS — N390 Urinary tract infection, site not specified: Secondary | ICD-10-CM | POA: Diagnosis not present

## 2024-08-04 ENCOUNTER — Ambulatory Visit (INDEPENDENT_AMBULATORY_CARE_PROVIDER_SITE_OTHER): Admitting: "Endocrinology

## 2024-08-04 ENCOUNTER — Encounter: Payer: Self-pay | Admitting: "Endocrinology

## 2024-08-04 VITALS — BP 102/70 | HR 71 | Ht 70.0 in | Wt 345.0 lb

## 2024-08-04 DIAGNOSIS — E1165 Type 2 diabetes mellitus with hyperglycemia: Secondary | ICD-10-CM | POA: Diagnosis not present

## 2024-08-04 DIAGNOSIS — N390 Urinary tract infection, site not specified: Secondary | ICD-10-CM | POA: Diagnosis not present

## 2024-08-04 DIAGNOSIS — Z7985 Long-term (current) use of injectable non-insulin antidiabetic drugs: Secondary | ICD-10-CM

## 2024-08-04 DIAGNOSIS — Z794 Long term (current) use of insulin: Secondary | ICD-10-CM | POA: Diagnosis not present

## 2024-08-04 DIAGNOSIS — N1832 Chronic kidney disease, stage 3b: Secondary | ICD-10-CM | POA: Diagnosis not present

## 2024-08-04 DIAGNOSIS — I5033 Acute on chronic diastolic (congestive) heart failure: Secondary | ICD-10-CM | POA: Diagnosis not present

## 2024-08-04 MED ORDER — INSULIN LISPRO PROT & LISPRO (75-25 MIX) 100 UNIT/ML KWIKPEN: 45.0000 [IU] | PEN_INJECTOR | Freq: Two times a day (BID) | SUBCUTANEOUS | 1 refills | Status: AC

## 2024-08-04 MED ORDER — TIRZEPATIDE 5 MG/0.5ML ~~LOC~~ SOAJ: 5.0000 mg | SUBCUTANEOUS | 0 refills | Status: DC

## 2024-08-04 MED ORDER — FREESTYLE LIBRE 3 PLUS SENSOR MISC: 3 refills | Status: DC

## 2024-08-04 NOTE — Progress Notes (Signed)
 Outpatient Endocrinology Note Kevin Birmingham, MD  08/04/24   [name] September 25, 1951 [id number]  Referring Provider: de Peru, Raymond J, MD Primary Care Provider: de Peru, Raymond J, MD Reason for consultation: Subjective   Assessment & Plan  Diagnoses and all orders for this visit:  Uncontrolled type 2 diabetes mellitus with hyperglycemia, with long-term current use of insulin  (HCC) -     Microalbumin / creatinine urine ratio  Long-term (current) use of injectable non-insulin  antidiabetic drugs  Long-term insulin  use (HCC)  Other orders -     tirzepatide (MOUNJARO) 5 MG/0.5ML Pen; Inject 5 mg into the skin once a week. -     Continuous Glucose Sensor (FREESTYLE LIBRE 3 PLUS SENSOR) MISC; Change sensor every 15 days. -     Insulin  Lispro Prot & Lispro (HUMALOG  75/25 MIX) (75-25) 100 UNIT/ML Kwikpen; Inject 45 Units into the skin 2 (two) times daily.   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,  Lab Results  Component Value Date   GFR 51.90 (L) 10/10/2022   Hba1c goal less than 7, current Hba1c is  Lab Results  Component Value Date   HGBA1C 6.7 (A) 05/13/2024   Patient reports he is in the rehab location for the past 2 years, currently on the wheelchair  Will recommend the following: Mounjaro 2.5mg /week Humalog  75/25 units: 45 units bid   Was stopped by another provider: Trulicity  4.5 mg weekly due to lack of weight loss/plateau  Ordered both Dexcom/libre 3+, patient will pick up whichever is covered by the insurance  Although low GFR, cannot start SGLT2 1 because patient thinks he may have a urine infection right now, has been checked in the rehab but I do not have the results No known contraindications/side effects to any of above medications  -Last LD and Tg are as follows: Lab Results  Component Value Date   LDLCALC 46 04/04/2021    Lab Results  Component Value Date   TRIG 61 04/04/2021   -not on statin, fasting lab  pending -Follow low fat diet and exercise   -Blood pressure goal <140/90 - Microalbumin/creatinine goal is < 30 -Last MA/Cr is as follows: Lab Results  Component Value Date   MICROALBUR 11.2 04/04/2021   -not on ACE/ARB, ordered MA/Cr ratio -diet changes including salt restriction -limit eating outside -counseled BP targets per standards of diabetes care -uncontrolled blood pressure can lead to retinopathy, nephropathy and cardiovascular and atherosclerotic heart disease  Reviewed and counseled on: -A1C target -Blood sugar targets -Complications of uncontrolled diabetes  -Checking blood sugar before meals and bedtime and bring log next visit -All medications with mechanism of action and side effects -Hypoglycemia management: rule of 15's, Glucagon Emergency Kit and medical alert ID -low-carb low-fat plate-method diet -At least 20 minutes of physical activity per day -Annual dilated retinal eye exam and foot exam -compliance and follow up needs -follow up as scheduled or earlier if problem gets worse  Call if blood sugar is less than 70 or consistently above 250    Take a 15 gm snack of carbohydrate at bedtime before you go to sleep if your blood sugar is less than 100.    If you are going to fast after midnight for a test or procedure, ask your physician for instructions on how to reduce/decrease your insulin  dose.    Call if blood sugar is less than 70 or consistently above 250  -Treating a low sugar by rule of 15  (15 gms of  sugar every 15 min until sugar is more than 70) If you feel your sugar is low, test your sugar to be sure If your sugar is low (less than 70), then take 15 grams of a fast acting Carbohydrate (3-4 glucose tablets or glucose gel or 4 ounces of juice or regular soda) Recheck your sugar 15 min after treating low to make sure it is more than 70 If sugar is still less than 70, treat again with 15 grams of carbohydrate          Don't drive the hour of  hypoglycemia  If unconscious/unable to eat or drink by mouth, use glucagon injection or nasal spray baqsimi and call 911. Can repeat again in 15 min if still unconscious.  Return in about 6 weeks (around 09/15/2024) for 10 am lab appointment follow by visit with me after lab, lab today.   I have reviewed current medications, nurse's notes, allergies, vital signs, past medical and surgical history, family medical history, and social history for this encounter. Counseled patient on symptoms, examination findings, lab findings, imaging results, treatment decisions and monitoring and prognosis. The patient understood the recommendations and agrees with the treatment plan. All questions regarding treatment plan were fully answered.  Kevin Birmingham, MD  08/04/24   History of Present Illness [name] is a 73 y.o. year old male who presents for follow up of Type II diabetes mellitus.  [name] was first diagnosed in ? (Patient could not answer) Diabetes education +  Home diabetes regimen: Mounjaro 2.5mg /week Humalog  75/25 units: 42 units bid   Was stopped by another provider: Trulicity  4.5 mg weekly due to lack of weight loss/plateau   COMPLICATIONS -  MI/Stroke -  retinopathy -  neuropathy +  nephropathy  SYMPTOMS REVIEWED - Polyuria - Weight loss - Blurred vision  BLOOD SUGAR DATA 87-221 qam, 121-298 qpm   Physical Exam  BP 102/70   Pulse 71   Ht 5' 10 (1.778 m)   Wt (!) 345 lb (156.5 kg)   SpO2 94%   BMI 49.50 kg/m    Constitutional: well developed, well nourished Head: normocephalic, atraumatic Eyes: sclera anicteric, no redness Neck: supple Lungs: normal respiratory effort Neurology: alert and oriented Skin: dry, no appreciable rashes Musculoskeletal: no appreciable defects Psychiatric: normal mood and affect Diabetic Foot Exam - Simple   Simple Foot Form Diabetic Foot exam was performed with the following findings: Yes 08/04/2024  1:48 PM   Visual Inspection See comments: Yes Sensation Testing Pulse Check See comments: Yes Comments Monofilament 2/3 R, 0/3L Erythematous, swollen B/L feet, mostly covered by dressing all the up way to legs, patient is under care of provider       Current Medications Patient's Medications  New Prescriptions   TIRZEPATIDE (MOUNJARO) 5 MG/0.5ML PEN    Inject 5 mg into the skin once a week.  Previous Medications   ANASTROZOLE (ARIMIDEX) 1 MG TABLET    Take by mouth.   APIXABAN  (ELIQUIS ) 5 MG TABS TABLET    Take 5 mg by mouth 2 (two) times daily.   B COMPLEX VITAMINS CAPSULE    Take 1 capsule by mouth every morning.   CHOLECALCIFEROL (D-3-5) 125 MCG (5000 UT) CAPSULE    Take 5,000 Units by mouth daily.   DICLOFENAC  SODIUM (VOLTAREN ) 1 % GEL    Apply topically.   DILTIAZEM  (TIAZAC ) 120 MG 24 HR CAPSULE    Take 1 capsule (120 mg total) by mouth daily.   DOCUSATE SODIUM  (  COLACE) 100 MG CAPSULE    Take 100 mg by mouth 2 (two) times daily.   DOXYCYCLINE  (VIBRA -TABS) 100 MG TABLET    Take 100 mg by mouth 2 (two) times daily.   EPINEPHRINE 0.3 MG/0.3 ML IJ SOAJ INJECTION    SMARTSIG:1 Milligram(s) IM Daily   FEBUXOSTAT  80 MG TABS    Take 1 tablet (80 mg total) by mouth daily.   FERROUS SULFATE 325 (65 FE) MG TABLET    Take 325 mg by mouth daily with breakfast.   FLUTICASONE  (FLONASE ) 50 MCG/ACT NASAL SPRAY    Place 2 sprays into both nostrils daily.   GLUCOSE BLOOD (ONETOUCH ULTRA) TEST STRIP    CHECK BLOOD SUGAR TWICE  DAILY   HYDROCODONE -ACETAMINOPHEN  (NORCO/VICODIN) 5-325 MG TABLET    Take 1 tablet by mouth every 6 (six) hours as needed for moderate pain (pain score 4-6).   INSULIN  PEN NEEDLE (B-D ULTRAFINE III SHORT PEN) 31G X 8 MM MISC    USE 1 PENNEEDLE TWICE DAILY   MICONAZOLE (MICOTIN) 2 % POWDER    Apply 1 Application topically in the morning and at bedtime. To abdominal fold.   MULTIPLE VITAMIN (MULTIVITAMIN) TABLET    Take 1 tablet by mouth daily.   NYSTATIN  POWDER    Apply topically.    ONDANSETRON  (ZOFRAN ) 8 MG TABLET    Take by mouth.   SIMETHICONE  80 MG TABS    Take 1 tablet (80 mg total) by mouth 3 (three) times daily as needed.   TERAZOSIN  (HYTRIN ) 10 MG CAPSULE    Take 10 mg by mouth at bedtime.   TESTOSTERONE CYPIONATE (DEPOTESTOSTERONE CYPIONATE) 200 MG/ML INJECTION    Inject 0.5 mLs into the muscle once a week. On Friday.   TORSEMIDE  (DEMADEX ) 10 MG TABLET    Take 10 mg by mouth daily.   TRAMADOL  (ULTRAM ) 50 MG TABLET    Take 1 tablet (50 mg total) by mouth in the morning, at noon, and at bedtime.  Modified Medications   Modified Medication Previous Medication   CONTINUOUS GLUCOSE SENSOR (FREESTYLE LIBRE 3 PLUS SENSOR) MISC Continuous Glucose Sensor (FREESTYLE LIBRE 3 PLUS SENSOR) MISC      Change sensor every 15 days.    Change sensor every 15 days.   INSULIN  LISPRO PROT & LISPRO (HUMALOG  75/25 MIX) (75-25) 100 UNIT/ML KWIKPEN Insulin  Lispro Prot & Lispro (HUMALOG  75/25 MIX) (75-25) 100 UNIT/ML Kwikpen      Inject 45 Units into the skin 2 (two) times daily.    Inject 42 Units into the skin 2 (two) times daily.  Discontinued Medications   CONTINUOUS GLUCOSE SENSOR (DEXCOM G7 SENSOR) MISC    1 Device by Does not apply route continuous.   MOUNJARO 2.5 MG/0.5ML PEN    Inject 2.5 mg into the skin once a week.    Allergies Allergies  Allergen Reactions   Other Other (See Comments)    Sneezing, coughing   Hydralazine  Other (See Comments)    Drowsiness/sedation/mental fog    Past Medical History Past Medical History:  Diagnosis Date   ALLERGIC RHINITIS 08/11/2006   Allergy 1989   Anxiety 2024   Became homeless   Arthritis 2020   ASTHMA 08/11/2006   Bradycardia    Beta blocker d/c'd 2022   Chronic atrial fibrillation (HCC) 08/2014   Chronic combined systolic and diastolic CHF (congestive heart failure) [facility])    Cardiology f/u 12/2017: pt volume overloaded (R heart dysf suspected), BNP very high, lasix  increased.  Repeat echo 12/2017:  normal LV EF, mild DD, +RV  syst dysfxn, mod pulm HTN, biatrial enlgmt.   Chronic constipation    Chronic renal insufficiency, stage 3 (moderate)    Colon cancer screening 02/2021   02/2021 Cologuard POS->GI ref   Depression 2023   Became homeless   DIABETES MELLITUS, TYPE II 08/11/2006   Heart murmur 1990   AFib   HYPERTENSION 08/11/2006   IDA (iron deficiency anemia)    04/2022.  Iron started.  Needs hemoccults   Impaired mobility and endurance    MRSA infection 06/2018   LL venous stasis ulcer infected   Normocytic anemia 2016-2019   03/2018 B12 normal, iron ok (ferritin borderline low).   OBESITY, MORBID 12/14/2007   saxenda started 05/2020 by [facility] wt mgmt center   OSA (obstructive sleep apnea)    to get sleep study with [facility]) as of 12/03/2018 consult.   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  [id number] ID started him on amoxil  prophylaxis---ineffective.  End 2018/Jan 2019 penicillin  G injections prophyl helpful but pt declined to continue this as of 01/2018 ID f/u.  ID talked him into resuming monthly penicillin  G as of 02/2018 f/u.     Restless leg syndrome    Rx'd clonazepam  09/2017 and pt refused to take it after reading the medication's potential side effects.   Venous stasis ulcers of both lower extremities (HCC)    Severe lymphedema.  wound clinic care ongoing as of 01/2018    Past Surgical History Past Surgical History:  Procedure Laterality Date   Carotid dopplers  07/23/2018   Left NORMAL.  Right 1-39% ICA stenosis, with <50% distal CCA stenosis (not hemodynamically significant)   LE venous dopplers Bilateral 03/06/2022   no dvt   RIGHT HEART CATH N/A 02/19/2022   Marked volume overload, R>>L HF, preserved CO. Procedure: RIGHT HEART CATH;  Surgeon: Cherrie Toribio SAUNDERS, MD;  Location: [facility] INVASIVE CV LAB;  Service: Cardiovascular;  Laterality: N/A;   TRANSTHORACIC ECHOCARDIOGRAM  11/2007; 09/2014; 11/2015;12/2017    LV fxn normal, EF normal, mild dilation of left atrium.  2015 grade II diast dysfxn.  2017 EF 55-60%. 2019: LVEF 60-65%, mild RV syst dysf,biatrial enlgmt, mod pulm htn. 02/14/22 EF 55-60%, unable to assess diast fxn, LVH, o/w normal   URETERAL STENT PLACEMENT     virtual colonoscopy  01/2011   Normal    Family History family history includes Cancer in his maternal grandmother; Dementia in his sister; Diabetes in his mother; Early death in his mother; Hearing loss in his father; Pancreatic cancer in his sister.  Social History Social History   Socioeconomic History   Marital status: Single    Spouse name: Not on file   Number of children: 1   Years of education: 14   Highest education level: Bachelor's degree (e.g., BA, AB, BS)  Occupational History   Occupation: retired  Tobacco Use   Smoking status: Never    Passive exposure: Never   Smokeless tobacco: Never  Vaping Use   Vaping status: Never Used  Substance and Sexual Activity   Alcohol use: Not Currently    Comment: Stopped late 70s   Drug use: Never   Sexual activity: Not Currently  Other Topics Concern   Not on file  Social History Narrative         Social Drivers of Health   Financial Resource Strain: Low Risk  (04/29/2024)   Overall Physicist, medical Strain (  CARDIA)    Difficulty of Paying Living Expenses: Not hard at all  Food Insecurity: No Food Insecurity (04/29/2024)   Hunger Vital Sign    Worried About Running Out of Food in the Last Year: Never true    Ran Out of Food in the Last Year: Never true  Transportation Needs: Unmet Transportation Needs (04/29/2024)   PRAPARE - Administrator, Civil Service (Medical): Yes    Lack of Transportation (Non-Medical): No  Physical Activity: Inactive (04/29/2024)   Exercise Vital Sign    Days of Exercise per Week: 0 days    Minutes of Exercise per Session: Not on file  Stress: No Stress Concern Present (04/29/2024)   [employer] of Occupational Health  - Occupational Stress Questionnaire    Feeling of Stress: Only a little  Social Connections: Moderately Integrated (04/29/2024)   Social Connection and Isolation Panel    Frequency of Communication with Friends and Family: Three times a week    Frequency of Social Gatherings with Friends and Family: More than three times a week    Attends Religious Services: More than 4 times per year    Active Member of Clubs or Organizations: Yes    Attends Banker Meetings: More than 4 times per year    Marital Status: Never married  Intimate Partner Violence: Not At Risk (11/20/2023)   Humiliation, Afraid, Rape, and Kick questionnaire    Fear of Current or Ex-Partner: No    Emotionally Abused: No    Physically Abused: No    Sexually Abused: No    Lab Results  Component Value Date   HGBA1C 6.7 (A) 05/13/2024   HGBA1C 8.0 (H) 01/12/2024   HGBA1C 8.4 (H) 06/26/2023   Lab Results  Component Value Date   CHOL 101 04/04/2021   Lab Results  Component Value Date   HDL 41 04/04/2021   Lab Results  Component Value Date   LDLCALC 46 04/04/2021   Lab Results  Component Value Date   TRIG 61 04/04/2021   Lab Results  Component Value Date   CHOLHDL 2.5 04/04/2021   Lab Results  Component Value Date   CREATININE 2.12 (H) 01/12/2024   Lab Results  Component Value Date   GFR 51.90 (L) 10/10/2022   Lab Results  Component Value Date   MICROALBUR 11.2 04/04/2021      Component Value Date/Time   NA 137 01/12/2024 1558   NA 139 10/24/2023 1127   K 5.3 01/12/2024 1558   CL 96 (L) 01/12/2024 1558   CO2 34 (H) 01/12/2024 1558   GLUCOSE 123 (H) 01/12/2024 1558   BUN 31 (H) 01/12/2024 1558   BUN 26 10/24/2023 1127   CREATININE 2.12 (H) 01/12/2024 1558   CALCIUM  9.1 01/12/2024 1558   PROT 7.2 01/12/2024 1558   PROT 6.4 12/23/2022 1043   ALBUMIN  2.9 (L) 06/27/2023 0446   ALBUMIN  3.5 (L) 12/23/2022 1043   AST 18 01/12/2024 1558   ALT 16 01/12/2024 1558   ALKPHOS 119  12/23/2022 1043   BILITOT 0.7 01/12/2024 1558   BILITOT 0.6 12/23/2022 1043   GFRNONAA 39 (L) 06/30/2023 0555   GFRAA 63 02/09/2020 1453      Latest Ref Rng & Units 01/12/2024    3:58 PM 10/24/2023   11:27 AM 06/30/2023    5:55 AM  BMP  Glucose 65 - 99 mg/dL 876  826  820   BUN 7 - 25 mg/dL 31  26  43  Creatinine 0.70 - 1.28 mg/dL 7.87  8.03  8.17   BUN/Creat Ratio 6 - 22 (calc) 15  13    Sodium 135 - 146 mmol/L 137  139  132   Potassium 3.5 - 5.3 mmol/L 5.3  5.2  3.8   Chloride 98 - 110 mmol/L 96  98  94   CO2 20 - 32 mmol/L 34  24  31   Calcium  8.6 - 10.3 mg/dL 9.1  8.8  8.2        Component Value Date/Time   WBC 8.6 10/24/2023 1127   WBC 9.9 06/26/2023 1251   RBC 6.06 (H) 10/24/2023 1127   RBC 6.82 (H) 06/26/2023 1251   HGB 17.9 (H) 10/24/2023 1127   HCT 55.9 (H) 10/24/2023 1127   PLT 218 10/24/2023 1127   MCV 92 10/24/2023 1127   MCH 29.5 10/24/2023 1127   MCH 24.2 (L) 06/26/2023 1251   MCHC 32.0 10/24/2023 1127   MCHC 32.2 06/26/2023 1251   RDW 13.5 10/24/2023 1127   LYMPHSABS 1.2 06/26/2023 1251   MONOABS 1.1 (H) 06/26/2023 1251   EOSABS 0.3 06/26/2023 1251   BASOSABS 0.1 06/26/2023 1251     Parts of this note may have been dictated using voice recognition software. There may be variances in spelling and vocabulary which are unintentional. Not all errors are proofread. Please notify the dino if any discrepancies are noted or if the meaning of any statement is not clear.

## 2024-08-04 NOTE — Patient Instructions (Signed)
   Goals of DM therapy:  Morning Fasting blood sugar: 80-140  Blood sugar before meals: 80-140 Bed time blood sugar: 100-150  A1C <7%, limited only by hypoglycemia  1.Diabetes medications and their side effects discussed, including hypoglycemia    2. Check blood glucose:  a) Always check blood sugars before driving. Please see below (under hypoglycemia) on how to manage b) Check a minimum of 3 times/day or more as needed when having symptoms of hypoglycemia.   c) Try to check blood glucose before sleeping/in the middle of the night to ensure that it is remaining stable and not dropping less than 100 d) Check blood glucose more often if sick  3. Diet: a) 3 meals per day schedule b: Restrict carbs to 60-70 grams (4 servings) per meal c) Colorful vegetables - 3 servings a day, and low sugar fruit 2 servings/day Plate control method: 1/4 plate protein, 1/4 starch, 1/2 green, yellow, or red vegetables d) Avoid carbohydrate snacks unless hypoglycemic episode, or increased physical activity  4. Regular exercise as tolerated, preferably 3 or more hours a week  5. Hypoglycemia: a)  Do not drive or operate machinery without first testing blood glucose to assure it is over 90 mg%, or if dizzy, lightheaded, not feeling normal, etc, or  if foot or leg is numb or weak. b)  If blood glucose less than 70, take four 5gm Glucose tabs or 15-30 gm Glucose gel.  Repeat every 15 min as needed until blood sugar is >100 mg/dl. If hypoglycemia persists then call 911.   6. Sick day management: a) Check blood glucose more often b) Continue usual therapy if blood sugars are elevated.   7. Contact the doctor immediately if blood glucose is frequently <60 mg/dl, or an episode of severe hypoglycemia occurs (where someone had to give you glucose/  glucagon or if you passed out from a low blood glucose), or if blood glucose is persistently >350 mg/dl, for further management  8. A change in level of physical  activity or exercise and a change in diet may also affect your blood sugar. Check blood sugars more often and call if needed.  Instructions: 1. Bring glucose meter, blood glucose records on every visit for review 2. Continue to follow up with primary care physician and other providers for medical care 3. Yearly eye  and foot exam 4. Please get blood work done prior to the next appointment

## 2024-08-05 DIAGNOSIS — E0865 Diabetes mellitus due to underlying condition with hyperglycemia: Secondary | ICD-10-CM | POA: Diagnosis not present

## 2024-08-05 DIAGNOSIS — N1832 Chronic kidney disease, stage 3b: Secondary | ICD-10-CM | POA: Diagnosis not present

## 2024-08-05 DIAGNOSIS — N390 Urinary tract infection, site not specified: Secondary | ICD-10-CM | POA: Diagnosis not present

## 2024-08-05 LAB — MICROALBUMIN / CREATININE URINE RATIO
Creatinine, Urine: 171 mg/dL (ref 20–320)
Microalb Creat Ratio: 27 mg/g{creat} (ref <30)
Microalb, Ur: 4.7 mg/dL

## 2024-08-12 ENCOUNTER — Ambulatory Visit: Admitting: "Endocrinology

## 2024-08-16 DIAGNOSIS — I5032 Chronic diastolic (congestive) heart failure: Secondary | ICD-10-CM | POA: Diagnosis not present

## 2024-08-16 DIAGNOSIS — N1832 Chronic kidney disease, stage 3b: Secondary | ICD-10-CM | POA: Diagnosis not present

## 2024-08-16 DIAGNOSIS — N2581 Secondary hyperparathyroidism of renal origin: Secondary | ICD-10-CM | POA: Diagnosis not present

## 2024-08-16 DIAGNOSIS — E1122 Type 2 diabetes mellitus with diabetic chronic kidney disease: Secondary | ICD-10-CM | POA: Diagnosis not present

## 2024-08-16 DIAGNOSIS — I129 Hypertensive chronic kidney disease with stage 1 through stage 4 chronic kidney disease, or unspecified chronic kidney disease: Secondary | ICD-10-CM | POA: Diagnosis not present

## 2024-08-16 DIAGNOSIS — D631 Anemia in chronic kidney disease: Secondary | ICD-10-CM | POA: Diagnosis not present

## 2024-08-16 DIAGNOSIS — N189 Chronic kidney disease, unspecified: Secondary | ICD-10-CM | POA: Diagnosis not present

## 2024-08-25 DIAGNOSIS — N184 Chronic kidney disease, stage 4 (severe): Secondary | ICD-10-CM | POA: Diagnosis not present

## 2024-08-25 DIAGNOSIS — Z794 Long term (current) use of insulin: Secondary | ICD-10-CM | POA: Diagnosis not present

## 2024-08-25 DIAGNOSIS — I10 Essential (primary) hypertension: Secondary | ICD-10-CM | POA: Diagnosis not present

## 2024-08-25 DIAGNOSIS — E1122 Type 2 diabetes mellitus with diabetic chronic kidney disease: Secondary | ICD-10-CM | POA: Diagnosis not present

## 2024-08-25 DIAGNOSIS — Z713 Dietary counseling and surveillance: Secondary | ICD-10-CM | POA: Diagnosis not present

## 2024-08-25 DIAGNOSIS — I890 Lymphedema, not elsewhere classified: Secondary | ICD-10-CM | POA: Diagnosis not present

## 2024-08-31 DIAGNOSIS — E1122 Type 2 diabetes mellitus with diabetic chronic kidney disease: Secondary | ICD-10-CM | POA: Diagnosis not present

## 2024-08-31 DIAGNOSIS — I4891 Unspecified atrial fibrillation: Secondary | ICD-10-CM | POA: Diagnosis not present

## 2024-08-31 DIAGNOSIS — I5030 Unspecified diastolic (congestive) heart failure: Secondary | ICD-10-CM | POA: Diagnosis not present

## 2024-09-02 ENCOUNTER — Ambulatory Visit (INDEPENDENT_AMBULATORY_CARE_PROVIDER_SITE_OTHER): Admitting: Family Medicine

## 2024-09-02 ENCOUNTER — Encounter (HOSPITAL_BASED_OUTPATIENT_CLINIC_OR_DEPARTMENT_OTHER): Payer: Self-pay | Admitting: Family Medicine

## 2024-09-02 VITALS — BP 131/76 | HR 80 | Wt 330.0 lb

## 2024-09-02 DIAGNOSIS — Z794 Long term (current) use of insulin: Secondary | ICD-10-CM

## 2024-09-02 DIAGNOSIS — E119 Type 2 diabetes mellitus without complications: Secondary | ICD-10-CM | POA: Insufficient documentation

## 2024-09-02 DIAGNOSIS — I10 Essential (primary) hypertension: Secondary | ICD-10-CM | POA: Diagnosis not present

## 2024-09-02 DIAGNOSIS — E1142 Type 2 diabetes mellitus with diabetic polyneuropathy: Secondary | ICD-10-CM | POA: Diagnosis not present

## 2024-09-02 NOTE — Assessment & Plan Note (Signed)
 Patient continues with insulin  and Mounjaro.  Mounjaro was started by endocrinology.  He reports that he is doing well with medications.  Most recent A1c with endocrinology was at goal.  He does have upcoming appointment with endocrinology in about 2 weeks, suspect that they will likely repeat A1c at that time. Given progress, can continue with current medication regimen, no changes today.  Recommend continued follow-up with endocrinology as scheduled He is up-to-date with urine ACR and foot exam

## 2024-09-02 NOTE — Assessment & Plan Note (Signed)
 Blood pressure borderline in office today.  He does continue to follow with cardiology, next appointment is in about 10 days. Given current blood pressure, no changes to medication regimen today.  Recommend intermittent monitoring of blood pressure at home, DASH diet.  Recommend continued follow-up with cardiology as scheduled.

## 2024-09-02 NOTE — Progress Notes (Signed)
    Procedures performed today:    None.  Independent interpretation of notes and tests performed by another provider:   None.  Brief History, Exam, Impression, and Recommendations:    BP 131/76 (BP Location: Left Wrist, Patient Position: Sitting, Cuff Size: Normal)   Pulse 80   Wt (!) 330 lb (149.7 kg)   SpO2 92%   BMI 47.35 kg/m   Type 2 diabetes mellitus with diabetic polyneuropathy, with long-term current use of insulin  [facility]) Assessment & Plan: Patient continues with insulin  and Mounjaro.  Mounjaro was started by endocrinology.  He reports that he is doing well with medications.  Most recent A1c with endocrinology was at goal.  He does have upcoming appointment with endocrinology in about 2 weeks, suspect that they will likely repeat A1c at that time. Given progress, can continue with current medication regimen, no changes today.  Recommend continued follow-up with endocrinology as scheduled He is up-to-date with urine ACR and foot exam   Hypertension, essential Assessment & Plan: Blood pressure borderline in office today.  He does continue to follow with cardiology, next appointment is in about 10 days. Given current blood pressure, no changes to medication regimen today.  Recommend intermittent monitoring of blood pressure at home, DASH diet.  Recommend continued follow-up with cardiology as scheduled.   Return in about 6 months (around 03/03/2025) for hypertension, diabetes.  Patient did have a form from his living facility here today.  Did review this in office with patient.  Also completed documentation of office visit on form regarding what was discussed today and management moving forward.  Did indicate no medication changes today.  Also recommend continuing with scheduled follow-up with specialists.  Spent 32 minutes on this patient encounter, including preparation, chart review, face-to-face counseling with patient and coordination of care, and documentation of  encounter   ___________________________________________ Geoffry Bannister de [place], MD, ABFM, [facility] Primary Care and Sports Medicine [facility]

## 2024-09-10 ENCOUNTER — Other Ambulatory Visit: Payer: Self-pay

## 2024-09-10 DIAGNOSIS — E1165 Type 2 diabetes mellitus with hyperglycemia: Secondary | ICD-10-CM

## 2024-09-10 MED ORDER — DEXCOM G7 SENSOR MISC: 3 refills | Status: AC

## 2024-09-13 ENCOUNTER — Ambulatory Visit (INDEPENDENT_AMBULATORY_CARE_PROVIDER_SITE_OTHER): Admitting: Family

## 2024-09-13 ENCOUNTER — Encounter (HOSPITAL_BASED_OUTPATIENT_CLINIC_OR_DEPARTMENT_OTHER): Payer: Self-pay | Admitting: Family

## 2024-09-13 VITALS — BP 112/68 | HR 82 | Ht 67.5 in | Wt 321.0 lb

## 2024-09-13 DIAGNOSIS — R42 Dizziness and giddiness: Secondary | ICD-10-CM

## 2024-09-13 DIAGNOSIS — I4819 Other persistent atrial fibrillation: Secondary | ICD-10-CM

## 2024-09-13 DIAGNOSIS — I5032 Chronic diastolic (congestive) heart failure: Secondary | ICD-10-CM | POA: Diagnosis not present

## 2024-09-13 DIAGNOSIS — I10 Essential (primary) hypertension: Secondary | ICD-10-CM | POA: Diagnosis not present

## 2024-09-13 DIAGNOSIS — Z794 Long term (current) use of insulin: Secondary | ICD-10-CM

## 2024-09-13 DIAGNOSIS — E1140 Type 2 diabetes mellitus with diabetic neuropathy, unspecified: Secondary | ICD-10-CM

## 2024-09-13 MED ORDER — MECLIZINE HCL 12.5 MG PO TABS: 12.5000 mg | ORAL_TABLET | Freq: Three times a day (TID) | ORAL | 2 refills | Status: AC | PRN

## 2024-09-13 NOTE — Patient Instructions (Signed)
 Medication Instructions:   Start Meclizine 12.5 mg by mouth 3 times daily as needed for dizziness  *If you need a refill on your cardiac medications before your next appointment, please call your pharmacy*   Follow-Up: At [facility], you and your health needs are our priority.  As part of our continuing mission to provide you with exceptional heart care, our providers are all part of one team.  This team includes your primary Cardiologist (physician) and Advanced Practice Providers or APPs (Physician Assistants and Nurse Practitioners) who all work together to provide you with the care you need, when you need it.  Your next appointment:   6 month(s)  Provider:   K. Chad Hilty, MD, Rosaline Bane, NP, or Reche Finder, NP

## 2024-09-13 NOTE — Progress Notes (Unsigned)
 Cardiology Office Note:  .   Date:  09/13/2024  ID:  [name], DOB [birth year], MRN [id number] PCP: de [place], Raymond J, MD  Nephrology: Dr. Dolan [facility] Providers Cardiologist:  Vinie JAYSON Maxcy, MD Cardiology APP:  Janene Boer, [place]  Nephrology: Dr. Dolan   History of Present Illness: .   [name] is a 73 y.o. male with a hx of morbid obesity, DM 2, chronic diastolic heart failure, persistent atrial fibrillation, hypertension, bilateral lower extremity lymphedema with chronic wounds.   Admitted 02/2022 with acute on chronic heart failure.  He noted eating a lot of high sodium meals while staying in a hotel waiting to move to a new apartment.  Diuresed with IV Lasix  and treated for UTI.  Echo with EF 55 to 60%, RV not well visualized.  RHC with RA 18, PA 63/19 (35), Fick CO/Cl 10.5/3.8, Thermo CO/Cl 7.2/2.6, PVR 1.9 WU (thermo), PAPi 2.4.  There was suspected pulmonary hypertension due to combination of left heart disease and primary pulmonary hypertension related to untreated OSA and obesity.  He had a 78 pound weight loss during admission.  Discharged on torsemide  40 mg daily.  Discharge weight 333 pounds.     Hospitalized 6/20-6/22/23 given 500cc bolus and 100 mL/hr normal saline. Metolazone  and Torsemide  held. He was discharge don Torsemide . Discharge weight 139.7 kg.    Seen 03/25/23,  Farxiga  initiated.  At visit 06/24/2023 noted successful weight loss of 334 pounds down to 301 pounds. Present regimen from SNF and nephrologist per SNF documentation Metolazone  5mg  M/W/F and Torsemide  40mg  BID continued.   Seen by [facility] 02/22/24 due to lymphedema.  Compression stockings recommended.  Since that time diuretics have been adjusted by nephrology as well as SNF.   Presents today for follow-up.  Weight stable from previous. Having successful weight loss with Mounjaro managed by endocrinology. Pending injection with orthopedics to build buffer to help with knee pain.  Presently limited to wheelchair. No chest pain, dyspnea. LE edema improving and has bilateral wraps in place. Continues to be frustrated by level of care at his SNF.  ROS: Please see the history of present illness.    All other systems reviewed and are negative.   Studies Reviewed: .        Cardiac Studies & Procedures   ______________________________________________________________________________________________ CARDIAC CATHETERIZATION  CARDIAC CATHETERIZATION 02/19/2022  Conclusion Findings:  RA = 18 RV = 60/24 PA =  63/19 (35) PCW = 22 Fick cardiac output/index = 10.5/3.8 Thermo CO/CI = 7.2/2.6 PVR = 1.9 WU (Thermo) FA sat = 92% PA sat = 63%, 65% High SVC = 68% PAPi = 2.4  Assessment: 1. Marked volume overload with R>>L heart failure 2. Preserved cardiac output  Plan/Discussion:  Remains markedly volume overloaded. Consider use of lasix  gtt +/- acetazolamide  or metolazone  to facilitate diuresis. Keep LEs wrapped.  Kevin Fuel, MD 9:20 AM     ECHOCARDIOGRAM  ECHOCARDIOGRAM COMPLETE 02/14/2022  Narrative ECHOCARDIOGRAM REPORT    Patient Name:   [name] Date of Exam: 02/14/2022 Medical Rec #:  [id number]          Height:       70.0 in Accession #:    [id number]         Weight:       411.2 lb Date of Birth:  [birth year]           BSA:          2.835 m Patient Age:  70 years           BP:           115/50 mmHg Patient Gender: M                  HR:           60 bpm. Exam Location:  Inpatient  Procedure: 2D Echo and Intracardiac Opacification Agent  Indications:    Cardiomegaly  History:        Patient has prior history of Echocardiogram examinations, most recent 12/29/2017. Arrythmias:Atrial Fibrillation; Risk Factors:Diabetes and Hypertension.  Sonographer:    Elida Casey Referring Phys: [id number] CHARLIE MATSU MACNEIL   Sonographer Comments: Patient is morbidly obese. Image acquisition challenging due to patient body  habitus. IMPRESSIONS   1. Left ventricular ejection fraction, by estimation, is 55 to 60%. The left ventricle has normal function. The left ventricle has no regional wall motion abnormalities. There is moderate left ventricular hypertrophy. Left ventricular diastolic function could not be evaluated. 2. Right ventricular systolic function was not well visualized. The right ventricular size is not well visualized. 3. Left atrial size was mildly dilated. 4. The mitral valve is normal in structure. No evidence of mitral valve regurgitation. 5. The aortic valve is normal in structure. There is mild calcification of the aortic valve. Aortic valve regurgitation is trivial.  FINDINGS Left Ventricle: Left ventricular ejection fraction, by estimation, is 55 to 60%. The left ventricle has normal function. The left ventricle has no regional wall motion abnormalities. The left ventricular internal cavity size was normal in size. There is moderate left ventricular hypertrophy. Left ventricular diastolic function could not be evaluated due to atrial fibrillation. Left ventricular diastolic function could not be evaluated.  Right Ventricle: The right ventricular size is not well visualized. Right vetricular wall thickness was not well visualized. Right ventricular systolic function was not well visualized.  Left Atrium: Left atrial size was mildly dilated.  Right Atrium: Right atrial size was normal in size.  Pericardium: There is no evidence of pericardial effusion.  Mitral Valve: The mitral valve is normal in structure. No evidence of mitral valve regurgitation.  Tricuspid Valve: The tricuspid valve is grossly normal. Tricuspid valve regurgitation is mild.  Aortic Valve: The aortic valve is normal in structure. There is mild calcification of the aortic valve. Aortic valve regurgitation is trivial. Aortic valve peak gradient measures 6.3 mmHg.  Pulmonic Valve: The pulmonic valve was normal in  structure. Pulmonic valve regurgitation is not visualized.  Aorta: The aortic root and ascending aorta are structurally normal, with no evidence of dilitation.  IAS/Shunts: The interatrial septum was not well visualized.   LEFT VENTRICLE PLAX 2D LVIDd:         5.10 cm   Diastology LVIDs:         3.70 cm   LV e' medial:    7.20 cm/s LV PW:         1.30 cm   LV E/e' medial:  17.6 LV IVS:        1.40 cm   LV e' lateral:   3.53 cm/s LVOT diam:     2.00 cm   LV E/e' lateral: 36.0 LV SV:         66 LV SV Index:   23 LVOT Area:     3.14 cm   IVC IVC diam: 3.70 cm  LEFT ATRIUM              Index LA diam:  4.80 cm  1.69 cm/m LA Vol (A2C):   108.0 ml 38.09 ml/m LA Vol (A4C):   75.4 ml  26.58 ml/m LA Biplane Vol: 107.0 ml 37.74 ml/m AORTIC VALVE                 PULMONIC VALVE AV Area (Vmax): 2.43 cm     PV Vmax:       0.90 m/s AV Vmax:        125.50 cm/s  PV Peak grad:  3.2 mmHg AV Peak Grad:   6.3 mmHg LVOT Vmax:      97.00 cm/s LVOT Vmean:     57.300 cm/s LVOT VTI:       0.211 m  AORTA Ao Root diam: 3.20 cm Ao Asc diam:  3.60 cm  MITRAL VALVE                TRICUSPID VALVE MV Area (PHT): 4.99 cm     TR Peak grad:   38.7 mmHg MV Decel Time: 152 msec     TR Vmax:        311.00 cm/s MV E velocity: 127.00 cm/s MV A velocity: 36.50 cm/s   SHUNTS MV E/A ratio:  3.48         Systemic VTI:  0.21 m Systemic Diam: 2.00 cm  Aleene Passe MD Electronically signed by Aleene Passe MD Signature Date/Time: 02/14/2022/5:04:52 PM    Final          ______________________________________________________________________________________________           Risk Assessment/Calculations:     CHA2DS2-VASc Score =     This indicates a  % annual risk of stroke. The patient's score is based upon:          Physical Exam:   VS:  BP 112/68   Pulse 82   Ht 5' 7.5 (1.715 m)   Wt (!) 321 lb (145.6 kg)   SpO2 96%   BMI 49.53 kg/m    Wt Readings from Last 3 Encounters:   09/13/24 (!) 321 lb (145.6 kg)  09/02/24 (!) 330 lb (149.7 kg)  08/04/24 (!) 345 lb (156.5 kg)    GEN: Well nourished, overweight, well developed in no acute distress NECK: No JVD; No carotid bruits CARDIAC: IRIR, no murmurs, rubs, gallops RESPIRATORY:  Clear to auscultation without rales, wheezing or rhonchi  ABDOMEN: Soft, non-tender, non-distended EXTREMITIES:  No edema; No deformity. Nonpitting bilateral LE edema. Bilateral LE with compression wraps.  ASSESSMENT AND PLAN: .    HFpEF / Right sided heart failure / Pulmonary hypertension -. Volume status difficult to ascertain due to body habitus. Weight stable. Bilateral LE in compression wrap with trace to nonpitting edema on exam with lymphedema-like appearance. Diuretic dosing per nephrology, continue Torsemide  40mg  BID and Metolazone  5mg  daily. Low sodium diet, fluid restriction <2L, and daily weights encouraged. Educated to contact our office for weight gain of 2 lbs overnight or 5 lbs in one week.    Snores - Consider sleep study when discharged from SNF. Given RHC and body habitus, would require in lab split night sleep study.   Longstanding persistent atrial fibrillation -asymptomatic in regards to atrial fibrillation with no palpitations.  Rate controlled today continue diltiazem  120 mg daily.  Continue Eliquis  5 mg twice daily.  Denies bleeding complications.  Does not meet dose reduction criteria.   CKDIIIb - Careful titration of diuretic and antihypertensive. Follows with Dr. Dolan. Farxiga  previously discontinued due to renal function.   DM2 - Currently  managed at SNF and with  Dr. Dartha of endocrinology.  Appreciate inclusion of GLP-1.   HTN - BP well controlled. Continue current antihypertensive regimen Diltiazem  120mg  daily, Torsemide  40mg  BID.    Lymphedema- Bilateral LE compression wraps in place.  Follows with [facility].    OA - Limits mobility.  Has upcoming injections and is hopeful these will improve his  mobility.  Vertigo - intermittent vertigo with sensation of spinning. Will Rx Meclixine 12.5mg  PRN for vertigo up to TID.        Dispo: follow up in 6 months  Signed, Reche GORMAN Finder, NP

## 2024-09-14 ENCOUNTER — Encounter (HOSPITAL_BASED_OUTPATIENT_CLINIC_OR_DEPARTMENT_OTHER): Payer: Self-pay | Admitting: Family

## 2024-09-15 ENCOUNTER — Other Ambulatory Visit

## 2024-09-15 ENCOUNTER — Ambulatory Visit (INDEPENDENT_AMBULATORY_CARE_PROVIDER_SITE_OTHER): Admitting: "Endocrinology

## 2024-09-15 ENCOUNTER — Encounter: Payer: Self-pay | Admitting: "Endocrinology

## 2024-09-15 VITALS — BP 130/80 | HR 84 | Ht 67.5 in | Wt 322.0 lb

## 2024-09-15 DIAGNOSIS — E7800 Pure hypercholesterolemia, unspecified: Secondary | ICD-10-CM | POA: Diagnosis not present

## 2024-09-15 DIAGNOSIS — Z794 Long term (current) use of insulin: Secondary | ICD-10-CM | POA: Diagnosis not present

## 2024-09-15 DIAGNOSIS — E1165 Type 2 diabetes mellitus with hyperglycemia: Secondary | ICD-10-CM | POA: Diagnosis not present

## 2024-09-15 DIAGNOSIS — Z7985 Long-term (current) use of injectable non-insulin antidiabetic drugs: Secondary | ICD-10-CM

## 2024-09-15 LAB — POCT GLYCOSYLATED HEMOGLOBIN (HGB A1C): Hemoglobin A1C: 6.7 % — AB (ref 4.0–5.6)

## 2024-09-15 LAB — LIPID PANEL
Cholesterol: 91 mg/dL
HDL: 28 mg/dL — ABNORMAL LOW
LDL Cholesterol (Calc): 48 mg/dL
Non-HDL Cholesterol (Calc): 63 mg/dL
Total CHOL/HDL Ratio: 3.3 (calc)
Triglycerides: 71 mg/dL

## 2024-09-15 MED ORDER — EMPAGLIFLOZIN 10 MG PO TABS
10.0000 mg | ORAL_TABLET | Freq: Every day | ORAL | 1 refills | Status: AC
Start: 2024-09-15 — End: ?

## 2024-09-15 MED ORDER — BAQSIMI ONE PACK 3 MG/DOSE NA POWD: 1.0000 | NASAL | 3 refills | Status: AC | PRN

## 2024-09-15 MED ORDER — TIRZEPATIDE 7.5 MG/0.5ML ~~LOC~~ SOAJ: 7.5000 mg | SUBCUTANEOUS | 1 refills | Status: AC

## 2024-09-15 NOTE — Patient Instructions (Signed)
   Goals of DM therapy:  Morning Fasting blood sugar: 80-140  Blood sugar before meals: 80-140 Bed time blood sugar: 100-150  A1C <7%, limited only by hypoglycemia  1.Diabetes medications and their side effects discussed, including hypoglycemia    2. Check blood glucose:  a) Always check blood sugars before driving. Please see below (under hypoglycemia) on how to manage b) Check a minimum of 3 times/day or more as needed when having symptoms of hypoglycemia.   c) Try to check blood glucose before sleeping/in the middle of the night to ensure that it is remaining stable and not dropping less than 100 d) Check blood glucose more often if sick  3. Diet: a) 3 meals per day schedule b: Restrict carbs to 60-70 grams (4 servings) per meal c) Colorful vegetables - 3 servings a day, and low sugar fruit 2 servings/day Plate control method: 1/4 plate protein, 1/4 starch, 1/2 green, yellow, or red vegetables d) Avoid carbohydrate snacks unless hypoglycemic episode, or increased physical activity  4. Regular exercise as tolerated, preferably 3 or more hours a week  5. Hypoglycemia: a)  Do not drive or operate machinery without first testing blood glucose to assure it is over 90 mg%, or if dizzy, lightheaded, not feeling normal, etc, or  if foot or leg is numb or weak. b)  If blood glucose less than 70, take four 5gm Glucose tabs or 15-30 gm Glucose gel.  Repeat every 15 min as needed until blood sugar is >100 mg/dl. If hypoglycemia persists then call 911.   6. Sick day management: a) Check blood glucose more often b) Continue usual therapy if blood sugars are elevated.   7. Contact the doctor immediately if blood glucose is frequently <60 mg/dl, or an episode of severe hypoglycemia occurs (where someone had to give you glucose/  glucagon or if you passed out from a low blood glucose), or if blood glucose is persistently >350 mg/dl, for further management  8. A change in level of physical  activity or exercise and a change in diet may also affect your blood sugar. Check blood sugars more often and call if needed.  Instructions: 1. Bring glucose meter, blood glucose records on every visit for review 2. Continue to follow up with primary care physician and other providers for medical care 3. Yearly eye  and foot exam 4. Please get blood work done prior to the next appointment

## 2024-09-15 NOTE — Progress Notes (Signed)
 Outpatient Endocrinology Note Kevin Birmingham, MD  09/15/24   [name] 1951/01/16 [id number]  Referring Provider: de [name], Kevin J, MD Primary Care Provider: de [name], Kevin J, MD Reason for consultation: Subjective   Assessment & Plan  Diagnoses and all orders for this visit:  Uncontrolled type 2 diabetes mellitus with hyperglycemia, with long-term current use of insulin  (HCC) -     POCT glycosylated hemoglobin (Hb A1C)  Long-term (current) use of injectable non-insulin  antidiabetic drugs  Long-term insulin  use (HCC)  Pure hypercholesterolemia  Other orders -     tirzepatide (MOUNJARO) 7.5 MG/0.5ML Pen; Inject 7.5 mg into the skin once a week. -     empagliflozin (JARDIANCE) 10 MG TABS tablet; Take 1 tablet (10 mg total) by mouth daily before breakfast. -     Glucagon (BAQSIMI ONE PACK) 3 MG/DOSE POWD; Place 1 Device into the nose as needed (Low blood sugar with impaired consciousness).   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,  Lab Results  Component Value Date   GFR 51.90 (L) 10/10/2022   Hba1c goal less than 7, current Hba1c is  Lab Results  Component Value Date   HGBA1C 6.7 (A) 09/15/2024   Patient reports he is in the rehab location for the past 2 years, currently on the wheelchair  Will recommend the following: Mounjaro 7.5mg /week Jardiance 10 mg/day, no UTI symptoms, discussed S/E Humalog  75/25 units: 45 units bid (decrease to 42 units bid if blood sugars drop less than 70 and call me) Put Dexcom today in clinic with education   Was stopped by another provider: Trulicity  4.5 mg weekly due to lack of weight loss/plateau  Ordered both Dexcom/libre 3+, patient will pick up whichever is covered by the insurance  No known contraindications/side effects to any of above medications Baqsimi discussed and prescribed with refills on 05/13/24  -Last LD and Tg are as follows: Lab Results  Component Value Date   LDLCALC 46  04/04/2021    Lab Results  Component Value Date   TRIG 61 04/04/2021   -not on statin, fasting lab pending -Follow low fat diet and exercise   -Blood pressure goal <140/90 - Microalbumin/creatinine goal is < 30 -Last MA/Cr is as follows: Lab Results  Component Value Date   MICROALBUR 4.7 08/04/2024   -not on ACE/ARB  -diet changes including salt restriction -limit eating outside -counseled BP targets per standards of diabetes care -uncontrolled blood pressure can lead to retinopathy, nephropathy and cardiovascular and atherosclerotic heart disease  Reviewed and counseled on: -A1C target -Blood sugar targets -Complications of uncontrolled diabetes  -Checking blood sugar before meals and bedtime and bring log next visit -All medications with mechanism of action and side effects -Hypoglycemia management: rule of 15's, Glucagon Emergency Kit and medical alert ID -low-carb low-fat plate-method diet -At least 20 minutes of physical activity per day -Annual dilated retinal eye exam and foot exam -compliance and follow up needs -follow up as scheduled or earlier if problem gets worse  Call if blood sugar is less than 70 or consistently above 250    Take a 15 gm snack of carbohydrate at bedtime before you go to sleep if your blood sugar is less than 100.    If you are going to fast after midnight for a test or procedure, ask your physician for instructions on how to reduce/decrease your insulin  dose.    Call if blood sugar is less than 70 or consistently above 250  -Treating a  low sugar by rule of 15  (15 gms of sugar every 15 min until sugar is more than 70) If you feel your sugar is low, test your sugar to be sure If your sugar is low (less than 70), then take 15 grams of a fast acting Carbohydrate (3-4 glucose tablets or glucose gel or 4 ounces of juice or regular soda) Recheck your sugar 15 min after treating low to make sure it is more than 70 If sugar is still less than  70, treat again with 15 grams of carbohydrate          Don't drive the hour of hypoglycemia  If unconscious/unable to eat or drink by mouth, use glucagon injection or nasal spray baqsimi and call 911. Can repeat again in 15 min if still unconscious.  Return in about 4 weeks (around 10/13/2024).   I have reviewed current medications, nurse's notes, allergies, vital signs, past medical and surgical history, family medical history, and social history for this encounter. Counseled patient on symptoms, examination findings, lab findings, imaging results, treatment decisions and monitoring and prognosis. The patient understood the recommendations and agrees with the treatment plan. All questions regarding treatment plan were fully answered.  Kevin Birmingham, MD  09/15/24   History of Present Illness [name] is a 73 y.o. year old male who presents for follow up of Type II diabetes mellitus.  [name] was first diagnosed in ? (Patient could not answer) Diabetes education +  Home diabetes regimen: Mounjaro 5 mg/week Humalog  75/25 units: 45 units bid   Was stopped by another provider: Trulicity  4.5 mg weekly due to lack of weight loss/plateau   COMPLICATIONS -  MI/Stroke -  retinopathy -  neuropathy +  nephropathy  SYMPTOMS REVIEWED - Polyuria - Weight loss - Blurred vision  BLOOD SUGAR DATA 122-180 qam, 105-235 qpm   Physical Exam  BP 130/80   Pulse 84   Ht 5' 7.5 (1.715 m)   Wt (!) 322 lb (146.1 kg)   SpO2 98%   BMI 49.69 kg/m    Constitutional: well developed, well nourished Head: normocephalic, atraumatic Eyes: sclera anicteric, no redness Neck: supple Lungs: normal respiratory effort Neurology: alert and oriented Skin: dry, no appreciable rashes Musculoskeletal: no appreciable defects Psychiatric: normal mood and affect Diabetic Foot Exam - Simple   No data filed      Current Medications Patient's Medications  New Prescriptions    EMPAGLIFLOZIN (JARDIANCE) 10 MG TABS TABLET    Take 1 tablet (10 mg total) by mouth daily before breakfast.   GLUCAGON (BAQSIMI ONE PACK) 3 MG/DOSE POWD    Place 1 Device into the nose as needed (Low blood sugar with impaired consciousness).   TIRZEPATIDE (MOUNJARO) 7.5 MG/0.5ML PEN    Inject 7.5 mg into the skin once a week.  Previous Medications   ANASTROZOLE (ARIMIDEX) 1 MG TABLET    Take by mouth.   APIXABAN  (ELIQUIS ) 5 MG TABS TABLET    Take 5 mg by mouth 2 (two) times daily.   B COMPLEX VITAMINS CAPSULE    Take 1 capsule by mouth every morning.   CONTINUOUS GLUCOSE SENSOR (DEXCOM G7 SENSOR) MISC    Use to check glucose continuously, change sensor every 10 days   DICLOFENAC  SODIUM (VOLTAREN ) 1 % GEL    Apply topically.   DILTIAZEM  (TIAZAC ) 120 MG 24 HR CAPSULE    Take 1 capsule (120 mg total) by mouth daily.   DOCUSATE SODIUM  (COLACE) 100 MG CAPSULE  Take 100 mg by mouth 2 (two) times daily.   EPINEPHRINE 0.3 MG/0.3 ML IJ SOAJ INJECTION    SMARTSIG:1 Milligram(s) IM Daily   FEBUXOSTAT  80 MG TABS    Take 1 tablet (80 mg total) by mouth daily.   FERROUS SULFATE 325 (65 FE) MG TABLET    Take 325 mg by mouth daily with breakfast.   FLUTICASONE  (FLONASE ) 50 MCG/ACT NASAL SPRAY    Place 2 sprays into both nostrils daily.   GLUCOSE BLOOD (ONETOUCH ULTRA) TEST STRIP    CHECK BLOOD SUGAR TWICE  DAILY   HYDROCODONE -ACETAMINOPHEN  (NORCO/VICODIN) 5-325 MG TABLET    Take 1 tablet by mouth every 6 (six) hours as needed for moderate pain (pain score 4-6).   INSULIN  LISPRO PROT & LISPRO (HUMALOG  75/25 MIX) (75-25) 100 UNIT/ML KWIKPEN    Inject 45 Units into the skin 2 (two) times daily.   INSULIN  PEN NEEDLE (B-D ULTRAFINE III SHORT PEN) 31G X 8 MM MISC    USE 1 PENNEEDLE TWICE DAILY   MECLIZINE (ANTIVERT) 12.5 MG TABLET    Take 1 tablet (12.5 mg total) by mouth 3 (three) times daily as needed for dizziness.   METOLAZONE  (ZAROXOLYN ) 5 MG TABLET    Take 5 mg by mouth daily.   MICONAZOLE (MICOTIN) 2 %  POWDER    Apply 1 Application topically in the morning and at bedtime. To abdominal fold.   MULTIPLE VITAMIN (MULTIVITAMIN) TABLET    Take 1 tablet by mouth daily.   NYSTATIN  POWDER    Apply topically.   ONDANSETRON  (ZOFRAN ) 8 MG TABLET    Take by mouth.   SIMETHICONE  80 MG TABS    Take 1 tablet (80 mg total) by mouth 3 (three) times daily as needed.   TERAZOSIN  (HYTRIN ) 10 MG CAPSULE    Take 10 mg by mouth at bedtime.   TESTOSTERONE CYPIONATE (DEPOTESTOSTERONE CYPIONATE) 200 MG/ML INJECTION    Inject 0.5 mLs into the muscle once a week. On Friday.   TORSEMIDE  (DEMADEX ) 10 MG TABLET    Take 40 mg by mouth 2 (two) times daily.   TRAMADOL  (ULTRAM ) 50 MG TABLET    Take 1 tablet (50 mg total) by mouth in the morning, at noon, and at bedtime.   TRAZODONE (DESYREL) 50 MG TABLET    Take 50 mg by mouth at bedtime.  Modified Medications   No medications on file  Discontinued Medications   TIRZEPATIDE (MOUNJARO) 5 MG/0.5ML PEN    Inject 5 mg into the skin once a week.    Allergies Allergies  Allergen Reactions   Other Other (See Comments)    Sneezing, coughing   Hydralazine  Other (See Comments)    Drowsiness/sedation/mental fog    Past Medical History Past Medical History:  Diagnosis Date   ALLERGIC RHINITIS 08/11/2006   Allergy 1989   Anxiety 2024   Became homeless   Arthritis 2020   ASTHMA 08/11/2006   Bradycardia    Beta blocker d/c'd 2022   Chronic atrial fibrillation (HCC) 08/2014   Chronic combined systolic and diastolic CHF (congestive heart failure) [facility])    Cardiology f/u 12/2017: pt volume overloaded (R heart dysf suspected), BNP very high, lasix  increased.  Repeat echo 12/2017: normal LV EF, mild DD, +RV syst dysfxn, mod pulm HTN, biatrial enlgmt.   Chronic constipation    Chronic renal insufficiency, stage 3 (moderate)    Colon cancer screening 02/2021   02/2021 Cologuard POS->GI ref   Depression 2023   Became homeless  DIABETES MELLITUS, TYPE II 08/11/2006   Heart  murmur 1990   AFib   HYPERTENSION 08/11/2006   IDA (iron deficiency anemia)    04/2022.  Iron started.  Needs hemoccults   Impaired mobility and endurance    MRSA infection 06/2018   LL venous stasis ulcer infected   Normocytic anemia 2016-2019   03/2018 B12 normal, iron ok (ferritin borderline low).   OBESITY, MORBID 12/14/2007   saxenda started 05/2020 by [facility] wt mgmt center   OSA (obstructive sleep apnea)    to get sleep study with [facility] as of 12/03/2018 consult.   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  [id number] ID started him on amoxil  prophylaxis---ineffective.  End 2018/Jan 2019 penicillin  G injections prophyl helpful but pt declined to continue this as of 01/2018 ID f/u.  ID talked him into resuming monthly penicillin  G as of 02/2018 f/u.     Restless leg syndrome    Rx'd clonazepam  09/2017 and pt refused to take it after reading the medication's potential side effects.   Venous stasis ulcers of both lower extremities (HCC)    Severe lymphedema.  wound clinic care ongoing as of 01/2018    Past Surgical History Past Surgical History:  Procedure Laterality Date   Carotid dopplers  07/23/2018   Left NORMAL.  Right 1-39% ICA stenosis, with <50% distal CCA stenosis (not hemodynamically significant)   LE venous dopplers Bilateral 03/06/2022   no dvt   RIGHT HEART CATH N/A 02/19/2022   Marked volume overload, R>>L HF, preserved CO. Procedure: RIGHT HEART CATH;  Surgeon: Cherrie Toribio SAUNDERS, MD;  Location: [facility] INVASIVE CV LAB;  Service: Cardiovascular;  Laterality: N/A;   TRANSTHORACIC ECHOCARDIOGRAM  11/2007; 09/2014; 11/2015;12/2017   LV fxn normal, EF normal, mild dilation of left atrium.  2015 grade II diast dysfxn.  2017 EF 55-60%. 2019: LVEF 60-65%, mild RV syst dysf,biatrial enlgmt, mod pulm htn. 02/14/22 EF 55-60%, unable to assess diast fxn, LVH, o/w normal   URETERAL STENT PLACEMENT     virtual  colonoscopy  01/2011   Normal    Family History family history includes Cancer in his maternal grandmother; Dementia in his sister; Diabetes in his mother; Early death in his mother; Hearing loss in his father; Pancreatic cancer in his sister.  Social History Social History   Socioeconomic History   Marital status: Single    Spouse name: Not on file   Number of children: 1   Years of education: 14   Highest education level: Bachelor's degree (e.g., BA, AB, BS)  Occupational History   Occupation: retired  Tobacco Use   Smoking status: Never    Passive exposure: Never   Smokeless tobacco: Never  Vaping Use   Vaping status: Never Used  Substance and Sexual Activity   Alcohol use: Not Currently    Comment: Stopped late 70s   Drug use: Never   Sexual activity: Not Currently  Other Topics Concern   Not on file  Social History Narrative         Social Drivers of Health   Financial Resource Strain: Low Risk  (08/29/2024)   Overall Financial Resource Strain (CARDIA)    Difficulty of Paying Living Expenses: Not hard at all  Food Insecurity: No Food Insecurity (09/13/2024)   Hunger Vital Sign    Worried About Running Out of Food in the Last Year: Never true    Ran Out of Food in the  Last Year: Never true  Transportation Needs: No Transportation Needs (08/29/2024)   PRAPARE - Administrator, Civil Service (Medical): No    Lack of Transportation (Non-Medical): No  Physical Activity: Inactive (08/29/2024)   Exercise Vital Sign    Days of Exercise per Week: 0 days    Minutes of Exercise per Session: Not on file  Stress: Stress Concern Present (08/29/2024)   [employer] of Occupational Health - Occupational Stress Questionnaire    Feeling of Stress: Rather much  Social Connections: Moderately Integrated (08/29/2024)   Social Connection and Isolation Panel    Frequency of Communication with Friends and Family: Three times a week    Frequency of Social  Gatherings with Friends and Family: More than three times a week    Attends Religious Services: More than 4 times per year    Active Member of Clubs or Organizations: Yes    Attends Banker Meetings: More than 4 times per year    Marital Status: Never married  Intimate Partner Violence: Not At Risk (11/20/2023)   Humiliation, Afraid, Rape, and Kick questionnaire    Fear of Current or Ex-Partner: No    Emotionally Abused: No    Physically Abused: No    Sexually Abused: No    Lab Results  Component Value Date   HGBA1C 6.7 (A) 09/15/2024   HGBA1C 6.7 (A) 05/13/2024   HGBA1C 8.0 (H) 01/12/2024   Lab Results  Component Value Date   CHOL 101 04/04/2021   Lab Results  Component Value Date   HDL 41 04/04/2021   Lab Results  Component Value Date   LDLCALC 46 04/04/2021   Lab Results  Component Value Date   TRIG 61 04/04/2021   Lab Results  Component Value Date   CHOLHDL 2.5 04/04/2021   Lab Results  Component Value Date   CREATININE 2.12 (H) 01/12/2024   Lab Results  Component Value Date   GFR 51.90 (L) 10/10/2022   Lab Results  Component Value Date   MICROALBUR 4.7 08/04/2024      Component Value Date/Time   NA 137 01/12/2024 1558   NA 139 10/24/2023 1127   K 5.3 01/12/2024 1558   CL 96 (L) 01/12/2024 1558   CO2 34 (H) 01/12/2024 1558   GLUCOSE 123 (H) 01/12/2024 1558   BUN 31 (H) 01/12/2024 1558   BUN 26 10/24/2023 1127   CREATININE 2.12 (H) 01/12/2024 1558   CALCIUM  9.1 01/12/2024 1558   PROT 7.2 01/12/2024 1558   PROT 6.4 12/23/2022 1043   ALBUMIN  2.9 (L) 06/27/2023 0446   ALBUMIN  3.5 (L) 12/23/2022 1043   AST 18 01/12/2024 1558   ALT 16 01/12/2024 1558   ALKPHOS 119 12/23/2022 1043   BILITOT 0.7 01/12/2024 1558   BILITOT 0.6 12/23/2022 1043   GFRNONAA 39 (L) 06/30/2023 0555   GFRAA 63 02/09/2020 1453      Latest Ref Rng & Units 01/12/2024    3:58 PM 10/24/2023   11:27 AM 06/30/2023    5:55 AM  BMP  Glucose 65 - 99 mg/dL 876  826   820   BUN 7 - 25 mg/dL 31  26  43   Creatinine 0.70 - 1.28 mg/dL 7.87  8.03  8.17   BUN/Creat Ratio 6 - 22 (calc) 15  13    Sodium 135 - 146 mmol/L 137  139  132   Potassium 3.5 - 5.3 mmol/L 5.3  5.2  3.8   Chloride 98 -  110 mmol/L 96  98  94   CO2 20 - 32 mmol/L 34  24  31   Calcium  8.6 - 10.3 mg/dL 9.1  8.8  8.2        Component Value Date/Time   WBC 8.6 10/24/2023 1127   WBC 9.9 06/26/2023 1251   RBC 6.06 (H) 10/24/2023 1127   RBC 6.82 (H) 06/26/2023 1251   HGB 17.9 (H) 10/24/2023 1127   HCT 55.9 (H) 10/24/2023 1127   PLT 218 10/24/2023 1127   MCV 92 10/24/2023 1127   MCH 29.5 10/24/2023 1127   MCH 24.2 (L) 06/26/2023 1251   MCHC 32.0 10/24/2023 1127   MCHC 32.2 06/26/2023 1251   RDW 13.5 10/24/2023 1127   LYMPHSABS 1.2 06/26/2023 1251   MONOABS 1.1 (H) 06/26/2023 1251   EOSABS 0.3 06/26/2023 1251   BASOSABS 0.1 06/26/2023 1251     Parts of this note may have been dictated using voice recognition software. There may be variances in spelling and vocabulary which are unintentional. Not all errors are proofread. Please notify the dino if any discrepancies are noted or if the meaning of any statement is not clear.

## 2024-09-17 ENCOUNTER — Telehealth: Payer: Self-pay | Admitting: Dietician

## 2024-09-17 NOTE — Telephone Encounter (Signed)
 Patient called with a question regarding the Dexcom reading vs the CBG reading that he gets done at the facility.  Discussed why there are differences and to check his CBG when he has symptoms of a low but his dexcom is reading normal.  Discussed that the Dexcom is safe to use for insulin  administration decisions.  He states that his fasting CGB this morning was 140 and he took 30 units of the 75/25 insulin .  He states that if he took the full dose, he would feel low.  Discussed if his blood glucose is in normal range but he feels low to suck on a piece of hard candy to help with the symptoms.  He has not started the [place] or is not sure if he has.  He is taking his other diabetes mediations as prescribed.   Discussed the plan for him to get used to a normal blood glucose.  Encouraged him to take his medications as prescribed and decrease sugar containing beverages.  Leita Constable, RD, LDN, CDCES, DipACLM

## 2024-10-11 ENCOUNTER — Ambulatory Visit (INDEPENDENT_AMBULATORY_CARE_PROVIDER_SITE_OTHER): Admitting: Vascular Surgery

## 2024-10-12 NOTE — Progress Notes (Deleted)
 Office Visit Note  Patient: [name]             Date of Birth: 1951/02/22           MRN: [id number]             PCP: de [place], Raymond J, MD Referring: de [place], Raymond J, MD Visit Date: 10/25/2024   Subjective:  No chief complaint on file.   History of Present Illness: [name] is a 73 y.o. male here for follow up on his Gout on uloric  80 mg daily.       Previous HPI 04/19/2024 [name] is a 73 y.o. male here for follow up on his Gout on uloric  80 mg daily.     His uric acid levels have improved, with recent labs showing a decrease to 5.1 mg/dL, which is within the target range of less than 6 mg/dL. This improvement is attributed to an increased dose of Uloric . He has not experienced any recent flare-ups of gout or arthritis, although he notes persistent soreness and numbness in his fingers, particularly affecting two fingers. The sensation is described as 'numb' and lacking feeling when touched.   He continues to experience shoulder pain, which has not improved following a previous injection. He is only able to raise his shoulder to a certain height, stating 'that's about as high as I can go.' There has been no improvement from physical therapy, and he is frustrated with the facility's lack of effective treatment.   His daily routine involves transferring from bed to chair, standing briefly to swivel and sit. His legs become tired by the end of the day, requiring the use of a transfer board to move from chair to bed. He has been consistent with this routine but feels his legs are weakened due to prolonged periods without standing.   No recent illness. He reports occasional pinching sensations in his hand, which are alleviated by wearing a brace. He has an open wound on his back that is tender, but he does not believe the wraps are too tight. He is able to manage his brace independently and does not require assistance.         Previous  HPI 01/12/2024 [name] is a 73 y.o. male here for follow up on his Gout. Since his last visit, he denies having any flares, although the pain in his right wrist and left DIP joint on the second digit of his hand are still tender. He has been taking his febuxostat  as prescribed.    He has seen Ortho for his shoulder and right knee osteoarthritis and will be receiving HA acid injections. However, he feels that his legs lack strength when trying to stand up. He is wheelchair bound. Has received injections on his knee but have not provided much relief. Notes that he did physical therapy and found that beneficial. For his right shoulder, he has limited range of motion. Has been hesitant to ask the aide to help him with warm compresses, but found these helpful. Has been doing physical therapy also found it beneficial.     Previous HPI 08/19/23 [name] is a 73 y.o. male here for evaluation of joint pain and swelling and erythema especially with recent episode of severe left hand and wrist swelling but also has some persistent symptoms with pain in the right wrist and chronic swelling in both lower legs.  Multiple comorbidities including congestive heart failure, hypertension, A-fib, venous insufficiency,  asthma, type 2 diabetes with associated neuropathy, lymphedema, and chronic kidney disease.  He did not recall a history of gout until this in the past year he feels symptoms have been much worse since moving to his current living facility setting.  This has also been accompanied by adjustments for diuretic medications and some exacerbation of chronic kidney disease.  He saw Dr. Leonce in sports medicine clinic in March noting hyperuricemia since at least June 2023 not on any urate lowering therapy did improve with short course of oral NSAIDs.  He has suffered a severe pain and swelling in his left hand in February at the wrist subsequently some swelling and right hand PIP joints more  recently has persistent trouble with right wrist pain describes a pinching type of sensation occurs usually provoked after use.   07/2023 eGFR 16   10/2023 36   01/2023 Uric acid 13.7   04/01/2023 Uric acid 8.2     Previous HPI 08/19/23 [name] is a 73 y.o. male here for evaluation of joint pain and swelling and erythema especially with recent episode of severe left hand and wrist swelling but also has some persistent symptoms with pain in the right wrist and chronic swelling in both lower legs.  Multiple comorbidities including congestive heart failure, hypertension, A-fib, venous insufficiency, asthma, type 2 diabetes with associated neuropathy, lymphedema, and chronic kidney disease.  He did not recall a history of gout until this in the past year he feels symptoms have been much worse since moving to his current living facility setting.  This has also been accompanied by adjustments for diuretic medications and some exacerbation of chronic kidney disease.  He saw Dr. Leonce in sports medicine clinic in March noting hyperuricemia since at least June 2023 not on any urate lowering therapy did improve with short course of oral NSAIDs.  He has suffered a severe pain and swelling in his left hand in February at the wrist subsequently some swelling and right hand PIP joints more recently has persistent trouble with right wrist pain describes a pinching type of sensation occurs usually provoked after use.   07/2023 eGFR 16   01/2023 Uric acid 13.7   No Rheumatology ROS completed.   PMFS History:  Patient Active Problem List   Diagnosis Date Noted   Diabetes mellitus (HCC) 09/02/2024   Decreased hearing 01/19/2024   CKD (chronic kidney disease) 11/28/2023   Urinary frequency 09/17/2023   Pain in right wrist 08/19/2023   Chronic gout due to renal impairment 08/19/2023   Onychomycosis 08/19/2023   Hypokalemia 06/26/2023   Primary osteoarthritis of right knee 06/04/2022    Primary osteoarthritis of left knee 06/04/2022   Body mass index 45.0-49.9, adult (HCC) 06/04/2022   Hyponatremia 04/23/2022   Pressure injury of skin 03/07/2022   Obesity, Class III, BMI 40-49.9 (morbid obesity) (HCC)    Uncontrolled type 2 diabetes mellitus with hyperglycemia (HCC) 03/04/2018   Chronic diastolic CHF (congestive heart failure) (HCC) 12/24/2016   Lymphedema 02/22/2016   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   Chronic headaches 05/10/2015   Persistent atrial fibrillation (HCC) 08/15/2014   Type 2 diabetes mellitus with diabetic neuropathy (HCC) 08/15/2014   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   Recurrent cellulitis of lower extremity 08/02/2013   Lymphedema of both lower extremities 11/04/2008   Morbid obesity due to excess calories (HCC) 12/14/2007   Venous (peripheral) insufficiency 07/22/2007   Uncontrolled type 2 diabetes mellitus with hyperglycemia, with long-term current use of insulin  (HCC) 08/11/2006  Hypertension, essential 08/11/2006   ALLERGIC RHINITIS 08/11/2006   Asthma 08/11/2006    Past Medical History:  Diagnosis Date   ALLERGIC RHINITIS 08/11/2006   Allergy 1989   Anxiety 2024   Became homeless   Arthritis 2020   ASTHMA 08/11/2006   Bradycardia    Beta blocker d/c'd 2022   Chronic atrial fibrillation (HCC) 08/2014   Chronic combined systolic and diastolic CHF (congestive heart failure) [facility])    Cardiology f/u 12/2017: pt volume overloaded (R heart dysf suspected), BNP very high, lasix  increased.  Repeat echo 12/2017: normal LV EF, mild DD, +RV syst dysfxn, mod pulm HTN, biatrial enlgmt.   Chronic constipation    Chronic renal insufficiency, stage 3 (moderate)    Colon cancer screening 02/2021   02/2021 Cologuard POS->GI ref   Depression 2023   Became homeless   DIABETES MELLITUS, TYPE II 08/11/2006   Heart murmur 1990   AFib   HYPERTENSION 08/11/2006   IDA (iron deficiency anemia)    04/2022.  Iron started.  Needs hemoccults    Impaired mobility and endurance    MRSA infection 06/2018   LL venous stasis ulcer infected   Normocytic anemia 2016-2019   03/2018 B12 normal, iron ok (ferritin borderline low).   OBESITY, MORBID 12/14/2007   saxenda started 05/2020 by [facility] wt mgmt center   OSA (obstructive sleep apnea)    to get sleep study with [facility]) as of 12/03/2018 consult.   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  [id number] ID started him on amoxil  prophylaxis---ineffective.  End 2018/Jan 2019 penicillin  G injections prophyl helpful but pt declined to continue this as of 01/2018 ID f/u.  ID talked him into resuming monthly penicillin  G as of 02/2018 f/u.     Restless leg syndrome    Rx'd clonazepam  09/2017 and pt refused to take it after reading the medication's potential side effects.   Venous stasis ulcers of both lower extremities (HCC)    Severe lymphedema.  wound clinic care ongoing as of 01/2018    Family History  Problem Relation Age of Onset   Diabetes Mother    Early death Mother    Hearing loss Father    Dementia Sister    Pancreatic cancer Sister    Cancer Maternal Grandmother    Past Surgical History:  Procedure Laterality Date   Carotid dopplers  07/23/2018   Left NORMAL.  Right 1-39% ICA stenosis, with <50% distal CCA stenosis (not hemodynamically significant)   LE venous dopplers Bilateral 03/06/2022   no dvt   RIGHT HEART CATH N/A 02/19/2022   Marked volume overload, R>>L HF, preserved CO. Procedure: RIGHT HEART CATH;  Surgeon: Cherrie Toribio SAUNDERS, MD;  Location: [facility] INVASIVE CV LAB;  Service: Cardiovascular;  Laterality: N/A;   TRANSTHORACIC ECHOCARDIOGRAM  11/2007; 09/2014; 11/2015;12/2017   LV fxn normal, EF normal, mild dilation of left atrium.  2015 grade II diast dysfxn.  2017 EF 55-60%. 2019: LVEF 60-65%, mild RV syst dysf,biatrial enlgmt, mod pulm htn. 02/14/22 EF 55-60%, unable to assess diast fxn, LVH, o/w  normal   URETERAL STENT PLACEMENT     virtual colonoscopy  01/2011   Normal   Social History   Social History Narrative         Immunization History  Administered Date(s) Administered   Fluad Quad(high Dose 65+) 07/06/2019, 07/12/2020, 07/11/2021, 07/10/2022   Fluad Trivalent(High Dose 65+) 08/04/2023   INFLUENZA, HIGH DOSE SEASONAL PF  08/05/2016, 09/08/2018   Influenza Split 08/19/2011, 08/11/2012   Influenza Whole 08/11/2006, 08/17/2008, 08/08/2009, 09/17/2010   Influenza,inj,Quad PF,6+ Mos 07/26/2013, 09/11/2015, 09/15/2017   PFIZER(Purple Top)SARS-COV-2 Vaccination 12/14/2019, 01/10/2020, 09/15/2020   Pneumococcal Conjugate-13 08/05/2016   Pneumococcal Polysaccharide-23 04/22/2011, 12/08/2017   Td 04/10/2009   Zoster, Live 04/22/2011     Objective: Vital Signs: There were no vitals taken for this visit.   Physical Exam   Musculoskeletal Exam: ***  CDAI Exam: CDAI Score: -- Patient Global: --; Provider Global: -- Swollen: --; Tender: -- Joint Exam 10/25/2024   No joint exam has been documented for this visit   There is currently no information documented on the homunculus. Go to the Rheumatology activity and complete the homunculus joint exam.  Investigation: No additional findings.  Imaging: No results found.  Recent Labs: Lab Results  Component Value Date   WBC 8.6 10/24/2023   HGB 17.9 (H) 10/24/2023   PLT 218 10/24/2023   NA 137 01/12/2024   K 5.3 01/12/2024   CL 96 (L) 01/12/2024   CO2 34 (H) 01/12/2024   GLUCOSE 123 (H) 01/12/2024   BUN 31 (H) 01/12/2024   CREATININE 2.12 (H) 01/12/2024   BILITOT 0.7 01/12/2024   ALKPHOS 119 12/23/2022   AST 18 01/12/2024   ALT 16 01/12/2024   PROT 7.2 01/12/2024   ALBUMIN  2.9 (L) 06/27/2023   CALCIUM  9.1 01/12/2024   GFRAA 63 02/09/2020    Speciality Comments: No specialty comments available.  Procedures:  No procedures performed Allergies: Other and Hydralazine    Assessment / Plan:     Visit  Diagnoses: No diagnosis found.  ***  Orders: No orders of the defined types were placed in this encounter.  No orders of the defined types were placed in this encounter.    Follow-Up Instructions: No follow-ups on file.   Jaylene Schrom M Neils [place], CMA  Note - This record has been created using Animal nutritionist.  Chart creation errors have been sought, but may not always  have been located. Such creation errors do not reflect on  the standard of medical care.

## 2024-10-20 ENCOUNTER — Ambulatory Visit: Admitting: "Endocrinology

## 2024-10-20 ENCOUNTER — Ambulatory Visit: Payer: Self-pay

## 2024-10-20 ENCOUNTER — Encounter: Payer: Self-pay | Admitting: "Endocrinology

## 2024-10-20 VITALS — BP 104/80 | HR 104 | Ht 67.5 in | Wt 322.0 lb

## 2024-10-20 DIAGNOSIS — E1165 Type 2 diabetes mellitus with hyperglycemia: Secondary | ICD-10-CM | POA: Diagnosis not present

## 2024-10-20 DIAGNOSIS — E7800 Pure hypercholesterolemia, unspecified: Secondary | ICD-10-CM | POA: Diagnosis not present

## 2024-10-20 DIAGNOSIS — Z7984 Long term (current) use of oral hypoglycemic drugs: Secondary | ICD-10-CM

## 2024-10-20 DIAGNOSIS — Z794 Long term (current) use of insulin: Secondary | ICD-10-CM

## 2024-10-20 DIAGNOSIS — Z7985 Long-term (current) use of injectable non-insulin antidiabetic drugs: Secondary | ICD-10-CM | POA: Diagnosis not present

## 2024-10-20 MED ORDER — ATORVASTATIN CALCIUM 10 MG PO TABS: 10.0000 mg | ORAL_TABLET | Freq: Every day | ORAL | 3 refills | Status: AC

## 2024-10-20 MED ORDER — EMPAGLIFLOZIN 25 MG PO TABS: 25.0000 mg | ORAL_TABLET | Freq: Every day | ORAL | 3 refills | Status: AC

## 2024-10-20 NOTE — Progress Notes (Signed)
 Outpatient Endocrinology Note Kevin Birmingham, MD  10/20/2024   [name] [birth year] [id number]  Referring Provider: de Cuba, Raymond J, MD Primary Care Provider: de Cuba, Raymond J, MD Reason for consultation: Subjective   Assessment & Plan  Diagnoses and all orders for this visit:  Uncontrolled type 2 diabetes mellitus with hyperglycemia, with long-term current use of insulin  (HCC)  Long term (current) use of oral hypoglycemic drugs  Long-term (current) use of injectable non-insulin  antidiabetic drugs  Long-term insulin  use (HCC)  Pure hypercholesterolemia  Other orders -     empagliflozin  (JARDIANCE ) 25 MG TABS tablet; Take 1 tablet (25 mg total) by mouth daily before breakfast. -     atorvastatin  (LIPITOR) 10 MG tablet; Take 1 tablet (10 mg total) by mouth daily.    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,  Lab Results  Component Value Date   GFR 51.90 (L) 10/10/2022   Hba1c goal less than 7, current Hba1c is  Lab Results  Component Value Date   HGBA1C 6.7 (A) 09/15/2024   Patient reports he is in the rehab location for the past 2 years, currently on the wheelchair  Will recommend the following: Mounjaro  7.5mg /week Jardiance  25 mg/day, no UTI symptoms, discussed S/E Humalog  75/25 units: 45 units bid (decrease to 42 units bid if blood sugars drop less than 70 and call me) Put Dexcom today in clinic with education   Was stopped by another provider: Trulicity  4.5 mg weekly due to lack of weight loss/plateau  Ordered both Dexcom/libre 3+, patient will pick up whichever is covered by the insurance  No known contraindications/side effects to any of above medications Baqsimi  discussed and prescribed with refills on 05/13/24  -Last LD and Tg are as follows: Lab Results  Component Value Date   LDLCALC 48 09/15/2024    Lab Results  Component Value Date   TRIG 71 09/15/2024   -not on statin,  09/15/24: LDL 48 -10/20/2024: Recommend  starting atorvastatin  10 mg daily for preventative purposes per guidelines -Follow low fat diet and exercise   -Blood pressure goal <140/90 - Microalbumin/creatinine goal is < 30 -Last MA/Cr is as follows: Lab Results  Component Value Date   MICROALBUR 4.7 08/04/2024   -not on ACE/ARB  -diet changes including salt restriction -limit eating outside -counseled BP targets per standards of diabetes care -uncontrolled blood pressure can lead to retinopathy, nephropathy and cardiovascular and atherosclerotic heart disease  Reviewed and counseled on: -A1C target -Blood sugar targets -Complications of uncontrolled diabetes  -Checking blood sugar before meals and bedtime and bring log next visit -All medications with mechanism of action and side effects -Hypoglycemia management: rule of 15's, Glucagon  Emergency Kit and medical alert ID -low-carb low-fat plate-method diet -At least 20 minutes of physical activity per day -Annual dilated retinal eye exam and foot exam -compliance and follow up needs -follow up as scheduled or earlier if problem gets worse  Call if blood sugar is less than 70 or consistently above 250    Take a 15 gm snack of carbohydrate at bedtime before you go to sleep if your blood sugar is less than 100.    If you are going to fast after midnight for a test or procedure, ask your physician for instructions on how to reduce/decrease your insulin  dose.    Call if blood sugar is less than 70 or consistently above 250  -Treating a low sugar by rule of 15  (15 gms of sugar every 15  min until sugar is more than 70) If you feel your sugar is low, test your sugar to be sure If your sugar is low (less than 70), then take 15 grams of a fast acting Carbohydrate (3-4 glucose tablets or glucose gel or 4 ounces of juice or regular soda) Recheck your sugar 15 min after treating low to make sure it is more than 70 If sugar is still less than 70, treat again with 15 grams of  carbohydrate          Don't drive the hour of hypoglycemia  If unconscious/unable to eat or drink by mouth, use glucagon  injection or nasal spray baqsimi  and call 911. Can repeat again in 15 min if still unconscious.  Return in about 3 months (around 01/18/2025).   I have reviewed current medications, nurse's notes, allergies, vital signs, past medical and surgical history, family medical history, and social history for this encounter. Counseled patient on symptoms, examination findings, lab findings, imaging results, treatment decisions and monitoring and prognosis. The patient understood the recommendations and agrees with the treatment plan. All questions regarding treatment plan were fully answered.  Kevin Birmingham, MD  10/20/2024   History of Present Illness [name] is a 73 y.o. year old male who presents for follow up of Type II diabetes mellitus.  [name] was first diagnosed in ? (Patient could not answer) Diabetes education +  Home diabetes regimen: Mounjaro  5 mg/week Humalog  75/25 units: 45 units bid   Was stopped by another provider: Trulicity  4.5 mg weekly due to lack of weight loss/plateau   COMPLICATIONS -  MI/Stroke -  retinopathy -  neuropathy +  nephropathy  SYMPTOMS REVIEWED - Polyuria - Weight loss - Blurred vision  BLOOD SUGAR DATA 116-241 qam, 107-218 qpm   Physical Exam  BP 104/80   Pulse (!) 104   Ht 5' 7.5 (1.715 m)   Wt (!) 322 lb (146.1 kg)   SpO2 92%   BMI 49.69 kg/m    Constitutional: well developed, well nourished Head: normocephalic, atraumatic Eyes: sclera anicteric, no redness Neck: supple Lungs: normal respiratory effort Neurology: alert and oriented Skin: dry, no appreciable rashes Musculoskeletal: no appreciable defects Psychiatric: normal mood and affect Diabetic Foot Exam - Simple   No data filed      Current Medications Patient's Medications  New Prescriptions   ATORVASTATIN  (LIPITOR) 10 MG  TABLET    Take 1 tablet (10 mg total) by mouth daily.  Previous Medications   ANASTROZOLE (ARIMIDEX) 1 MG TABLET    Take by mouth.   APIXABAN  (ELIQUIS ) 5 MG TABS TABLET    Take 5 mg by mouth 2 (two) times daily.   B COMPLEX VITAMINS CAPSULE    Take 1 capsule by mouth every morning.   CONTINUOUS GLUCOSE SENSOR (DEXCOM G7 SENSOR) MISC    Use to check glucose continuously, change sensor every 10 days   DICLOFENAC  SODIUM (VOLTAREN ) 1 % GEL    Apply topically.   DILTIAZEM  (TIAZAC ) 120 MG 24 HR CAPSULE    Take 1 capsule (120 mg total) by mouth daily.   DOCUSATE SODIUM  (COLACE) 100 MG CAPSULE    Take 100 mg by mouth 2 (two) times daily.   EPINEPHRINE 0.3 MG/0.3 ML IJ SOAJ INJECTION    SMARTSIG:1 Milligram(s) IM Daily   FEBUXOSTAT  80 MG TABS    Take 1 tablet (80 mg total) by mouth daily.   FERROUS SULFATE 325 (65 FE) MG TABLET    Take 325  mg by mouth daily with breakfast.   FLUTICASONE  (FLONASE ) 50 MCG/ACT NASAL SPRAY    Place 2 sprays into both nostrils daily.   GLUCAGON  (BAQSIMI  ONE PACK) 3 MG/DOSE POWD    Place 1 Device into the nose as needed (Low blood sugar with impaired consciousness).   GLUCOSE BLOOD (ONETOUCH ULTRA) TEST STRIP    CHECK BLOOD SUGAR TWICE  DAILY   HYDROCODONE -ACETAMINOPHEN  (NORCO/VICODIN) 5-325 MG TABLET    Take 1 tablet by mouth every 6 (six) hours as needed for moderate pain (pain score 4-6).   INSULIN  LISPRO PROT & LISPRO (HUMALOG  75/25 MIX) (75-25) 100 UNIT/ML KWIKPEN    Inject 45 Units into the skin 2 (two) times daily.   INSULIN  PEN NEEDLE (B-D ULTRAFINE III SHORT PEN) 31G X 8 MM MISC    USE 1 PENNEEDLE TWICE DAILY   MECLIZINE  (ANTIVERT ) 12.5 MG TABLET    Take 1 tablet (12.5 mg total) by mouth 3 (three) times daily as needed for dizziness.   METOLAZONE  (ZAROXOLYN ) 5 MG TABLET    Take 5 mg by mouth daily.   MICONAZOLE (MICOTIN) 2 % POWDER    Apply 1 Application topically in the morning and at bedtime. To abdominal fold.   MULTIPLE VITAMIN (MULTIVITAMIN) TABLET    Take 1  tablet by mouth daily.   NYSTATIN  POWDER    Apply topically.   ONDANSETRON  (ZOFRAN ) 8 MG TABLET    Take by mouth.   SIMETHICONE  80 MG TABS    Take 1 tablet (80 mg total) by mouth 3 (three) times daily as needed.   TERAZOSIN  (HYTRIN ) 10 MG CAPSULE    Take 10 mg by mouth at bedtime.   TESTOSTERONE CYPIONATE (DEPOTESTOSTERONE CYPIONATE) 200 MG/ML INJECTION    Inject 0.5 mLs into the muscle once a week. On Friday.   TIRZEPATIDE  (MOUNJARO ) 7.5 MG/0.5ML PEN    Inject 7.5 mg into the skin once a week.   TORSEMIDE  (DEMADEX ) 10 MG TABLET    Take 40 mg by mouth 2 (two) times daily.   TRAMADOL  (ULTRAM ) 50 MG TABLET    Take 1 tablet (50 mg total) by mouth in the morning, at noon, and at bedtime.   TRAZODONE (DESYREL) 50 MG TABLET    Take 50 mg by mouth at bedtime.  Modified Medications   Modified Medication Previous Medication   EMPAGLIFLOZIN  (JARDIANCE ) 25 MG TABS TABLET empagliflozin  (JARDIANCE ) 10 MG TABS tablet      Take 1 tablet (25 mg total) by mouth daily before breakfast.    Take 1 tablet (10 mg total) by mouth daily before breakfast.  Discontinued Medications   No medications on file    Allergies Allergies  Allergen Reactions   Other Other (See Comments)    Sneezing, coughing   Hydralazine  Other (See Comments)    Drowsiness/sedation/mental fog    Past Medical History Past Medical History:  Diagnosis Date   ALLERGIC RHINITIS 08/11/2006   Allergy 1989   Anxiety 2024   Became homeless   Arthritis 2020   ASTHMA 08/11/2006   Bradycardia    Beta blocker d/c'd 2022   Chronic atrial fibrillation (HCC) 08/2014   Chronic combined systolic and diastolic CHF (congestive heart failure) [facility])    Cardiology f/u 12/2017: pt volume overloaded (R heart dysf suspected), BNP very high, lasix  increased.  Repeat echo 12/2017: normal LV EF, mild DD, +RV syst dysfxn, mod pulm HTN, biatrial enlgmt.   Chronic constipation    Chronic renal insufficiency, stage 3 (moderate)  Colon cancer screening  02/2021   02/2021 Cologuard POS->GI ref   Depression 2023   Became homeless   DIABETES MELLITUS, TYPE II 08/11/2006   Heart murmur 1990   AFib   HYPERTENSION 08/11/2006   IDA (iron deficiency anemia)    04/2022.  Iron started.  Needs hemoccults   Impaired mobility and endurance    MRSA infection 06/2018   LL venous stasis ulcer infected   Normocytic anemia 2016-2019   03/2018 B12 normal, iron ok (ferritin borderline low).   OBESITY, MORBID 12/14/2007   saxenda started 05/2020 by [facility] wt mgmt center   OSA (obstructive sleep apnea)    to get sleep study with [facility]) as of 12/03/2018 consult.   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  [id number] ID started him on amoxil  prophylaxis---ineffective.  End 2018/Jan 2019 penicillin  G injections prophyl helpful but pt declined to continue this as of 01/2018 ID f/u.  ID talked him into resuming monthly penicillin  G as of 02/2018 f/u.     Restless leg syndrome    Rx'd clonazepam  09/2017 and pt refused to take it after reading the medication's potential side effects.   Venous stasis ulcers of both lower extremities (HCC)    Severe lymphedema.  wound clinic care ongoing as of 01/2018    Past Surgical History Past Surgical History:  Procedure Laterality Date   Carotid dopplers  07/23/2018   Left NORMAL.  Right 1-39% ICA stenosis, with <50% distal CCA stenosis (not hemodynamically significant)   LE venous dopplers Bilateral 03/06/2022   no dvt   RIGHT HEART CATH N/A 02/19/2022   Marked volume overload, R>>L HF, preserved CO. Procedure: RIGHT HEART CATH;  Surgeon: Cherrie Toribio SAUNDERS, MD;  Location: [facility] INVASIVE CV LAB;  Service: Cardiovascular;  Laterality: N/A;   TRANSTHORACIC ECHOCARDIOGRAM  11/2007; 09/2014; 11/2015;12/2017   LV fxn normal, EF normal, mild dilation of left atrium.  2015 grade II diast dysfxn.  2017 EF 55-60%. 2019: LVEF 60-65%, mild RV syst dysf,biatrial  enlgmt, mod pulm htn. 02/14/22 EF 55-60%, unable to assess diast fxn, LVH, o/w normal   URETERAL STENT PLACEMENT     virtual colonoscopy  01/2011   Normal    Family History family history includes Cancer in his maternal grandmother; Dementia in his sister; Diabetes in his mother; Early death in his mother; Hearing loss in his father; Pancreatic cancer in his sister.  Social History Social History   Socioeconomic History   Marital status: Single    Spouse name: Not on file   Number of children: 1   Years of education: 14   Highest education level: Bachelor's degree (e.g., BA, AB, BS)  Occupational History   Occupation: retired  Tobacco Use   Smoking status: Never    Passive exposure: Never   Smokeless tobacco: Never  Vaping Use   Vaping status: Never Used  Substance and Sexual Activity   Alcohol use: Not Currently    Comment: Stopped late 70s   Drug use: Never   Sexual activity: Not Currently  Other Topics Concern   Not on file  Social History Narrative         Social Drivers of Health   Tobacco Use: Low Risk (10/20/2024)   Patient History    Smoking Tobacco Use: Never    Smokeless Tobacco Use: Never    Passive Exposure: Never  Financial Resource Strain: Low Risk (08/29/2024)   Overall Physicist, Medical Strain (  CARDIA)    Difficulty of Paying Living Expenses: Not hard at all  Food Insecurity: No Food Insecurity (09/13/2024)   Epic    Worried About Programme Researcher, Broadcasting/film/video in the Last Year: Never true    Ran Out of Food in the Last Year: Never true  Transportation Needs: No Transportation Needs (08/29/2024)   Epic    Lack of Transportation (Medical): No    Lack of Transportation (Non-Medical): No  Physical Activity: Inactive (08/29/2024)   Exercise Vital Sign    Days of Exercise per Week: 0 days    Minutes of Exercise per Session: Not on file  Stress: Stress Concern Present (08/29/2024)   [employer] of Occupational Health - Occupational Stress Questionnaire     Feeling of Stress: Rather much  Social Connections: Moderately Integrated (08/29/2024)   Social Connection and Isolation Panel    Frequency of Communication with Friends and Family: Three times a week    Frequency of Social Gatherings with Friends and Family: More than three times a week    Attends Religious Services: More than 4 times per year    Active Member of Clubs or Organizations: Yes    Attends Banker Meetings: More than 4 times per year    Marital Status: Never married  Intimate Partner Violence: Not At Risk (11/20/2023)   Humiliation, Afraid, Rape, and Kick questionnaire    Fear of Current or Ex-Partner: No    Emotionally Abused: No    Physically Abused: No    Sexually Abused: No  Depression (PHQ2-9): Low Risk (05/03/2024)   Depression (PHQ2-9)    PHQ-2 Score: 4  Alcohol Screen: Low Risk (11/20/2023)   Alcohol Screen    Last Alcohol Screening Score (AUDIT): 0  Housing: Low Risk (08/29/2024)   Epic    Unable to Pay for Housing in the Last Year: No    Number of Times Moved in the Last Year: 0    Homeless in the Last Year: No  Utilities: Not At Risk (11/20/2023)   AHC Utilities    Threatened with loss of utilities: No  Health Literacy: Adequate Health Literacy (08/15/2023)   B1300 Health Literacy    Frequency of need for help with medical instructions: Never    Lab Results  Component Value Date   HGBA1C 6.7 (A) 09/15/2024   HGBA1C 6.7 (A) 05/13/2024   HGBA1C 8.0 (H) 01/12/2024   Lab Results  Component Value Date   CHOL 91 09/15/2024   Lab Results  Component Value Date   HDL 28 (L) 09/15/2024   Lab Results  Component Value Date   LDLCALC 48 09/15/2024   Lab Results  Component Value Date   TRIG 71 09/15/2024   Lab Results  Component Value Date   CHOLHDL 3.3 09/15/2024   Lab Results  Component Value Date   CREATININE 2.12 (H) 01/12/2024   Lab Results  Component Value Date   GFR 51.90 (L) 10/10/2022   Lab Results  Component Value  Date   MICROALBUR 4.7 08/04/2024      Component Value Date/Time   NA 137 01/12/2024 1558   NA 139 10/24/2023 1127   K 5.3 01/12/2024 1558   CL 96 (L) 01/12/2024 1558   CO2 34 (H) 01/12/2024 1558   GLUCOSE 123 (H) 01/12/2024 1558   BUN 31 (H) 01/12/2024 1558   BUN 26 10/24/2023 1127   CREATININE 2.12 (H) 01/12/2024 1558   CALCIUM  9.1 01/12/2024 1558   PROT 7.2 01/12/2024 1558  PROT 6.4 12/23/2022 1043   ALBUMIN  2.9 (L) 06/27/2023 0446   ALBUMIN  3.5 (L) 12/23/2022 1043   AST 18 01/12/2024 1558   ALT 16 01/12/2024 1558   ALKPHOS 119 12/23/2022 1043   BILITOT 0.7 01/12/2024 1558   BILITOT 0.6 12/23/2022 1043   GFRNONAA 39 (L) 06/30/2023 0555   GFRAA 63 02/09/2020 1453      Latest Ref Rng & Units 01/12/2024    3:58 PM 10/24/2023   11:27 AM 06/30/2023    5:55 AM  BMP  Glucose 65 - 99 mg/dL 876  826  820   BUN 7 - 25 mg/dL 31  26  43   Creatinine 0.70 - 1.28 mg/dL 7.87  8.03  8.17   BUN/Creat Ratio 6 - 22 (calc) 15  13    Sodium 135 - 146 mmol/L 137  139  132   Potassium 3.5 - 5.3 mmol/L 5.3  5.2  3.8   Chloride 98 - 110 mmol/L 96  98  94   CO2 20 - 32 mmol/L 34  24  31   Calcium  8.6 - 10.3 mg/dL 9.1  8.8  8.2        Component Value Date/Time   WBC 8.6 10/24/2023 1127   WBC 9.9 06/26/2023 1251   RBC 6.06 (H) 10/24/2023 1127   RBC 6.82 (H) 06/26/2023 1251   HGB 17.9 (H) 10/24/2023 1127   HCT 55.9 (H) 10/24/2023 1127   PLT 218 10/24/2023 1127   MCV 92 10/24/2023 1127   MCH 29.5 10/24/2023 1127   MCH 24.2 (L) 06/26/2023 1251   MCHC 32.0 10/24/2023 1127   MCHC 32.2 06/26/2023 1251   RDW 13.5 10/24/2023 1127   LYMPHSABS 1.2 06/26/2023 1251   MONOABS 1.1 (H) 06/26/2023 1251   EOSABS 0.3 06/26/2023 1251   BASOSABS 0.1 06/26/2023 1251     Parts of this note may have been dictated using voice recognition software. There may be variances in spelling and vocabulary which are unintentional. Not all errors are proofread. Please notify the dino if any discrepancies are  noted or if the meaning of any statement is not clear.

## 2024-10-20 NOTE — Telephone Encounter (Signed)
°  FYI Only or Action Required?: Action required by provider: request for urine test order sent to [facility].  Patient was last seen in primary care on 09/02/2024 by de Cuba, [name], MD.  Called Nurse Triage reporting Dysuria.  Symptoms began yesterday.  Interventions attempted: Nothing.  Symptoms are: unchanged.  Triage Disposition: See HCP Within 4 Hours (Or PCP Triage)  Patient/caregiver understands and will follow disposition?: No, wishes to speak with PCP  Copied from CRM #[id number]. Topic: Clinical - Red Word Triage >> Oct 20, 2024  4:44 PM Wess RAMAN wrote: Red Word that prompted transfer to Nurse Triage: UTI-pain and burning during urination and urgency  Would like an appt with PCP Reason for Disposition  Side (flank) or lower back pain present  Answer Assessment - Initial Assessment Questions Pt states he has been having decreased urination for him. He is still going about every 4 hours and is getting about a half a cup out but really has to push. States he is having pain with urination as well, also having flank pain on left side. RN advised UC due to schedule availability. Pt states he lives in an assisted living community and doesn't have access to go to UC. He is requesting if we can't schedule him to send an order for a urinary test to [facility].  RN gave instructions on when to go to the ER. Pt stated understanding.     1. SYMPTOM: What's the main symptom you're concerned about? (e.g., frequency, incontinence)     Decreased urination, pain with urination, flank pain 2. ONSET: When did the  symptoms  start?     yesterday 3. PAIN: Is there any pain? If Yes, ask: How bad is it? (Scale: 1-10; mild, moderate, severe)     5 4. CAUSE: What do you think is causing the symptoms?     Pt thinks UTI 5. OTHER SYMPTOMS: Do you have any other symptoms? (e.g., blood in urine, fever, flank pain, pain with urination)     Flank  pain, pain with urination  Protocols used: Urinary Symptoms-A-AH

## 2024-10-20 NOTE — Patient Instructions (Signed)
 Will recommend the following: Mounjaro  7.5mg /week Jardiance  25 mg/day, no UTI symptoms, discussed S/E Humalog  75/25 units: 45 units bid (decrease to 42 units bid if blood sugars drop less than 70 and call me)

## 2024-10-21 ENCOUNTER — Encounter (HOSPITAL_BASED_OUTPATIENT_CLINIC_OR_DEPARTMENT_OTHER): Payer: Self-pay

## 2024-10-21 NOTE — Telephone Encounter (Signed)
 Will send patient the fax number to have facility fax over order request.

## 2024-10-25 ENCOUNTER — Encounter (INDEPENDENT_AMBULATORY_CARE_PROVIDER_SITE_OTHER): Payer: Self-pay | Admitting: Vascular Surgery

## 2024-10-25 ENCOUNTER — Ambulatory Visit: Admitting: Internal Medicine

## 2024-10-25 ENCOUNTER — Ambulatory Visit (INDEPENDENT_AMBULATORY_CARE_PROVIDER_SITE_OTHER)

## 2024-10-25 ENCOUNTER — Ambulatory Visit (INDEPENDENT_AMBULATORY_CARE_PROVIDER_SITE_OTHER): Admitting: Vascular Surgery

## 2024-10-25 ENCOUNTER — Other Ambulatory Visit (INDEPENDENT_AMBULATORY_CARE_PROVIDER_SITE_OTHER): Payer: Self-pay | Admitting: Vascular Surgery

## 2024-10-25 VITALS — Resp 17 | Ht 70.0 in | Wt 306.8 lb

## 2024-10-25 DIAGNOSIS — I872 Venous insufficiency (chronic) (peripheral): Secondary | ICD-10-CM | POA: Diagnosis not present

## 2024-10-25 DIAGNOSIS — E1140 Type 2 diabetes mellitus with diabetic neuropathy, unspecified: Secondary | ICD-10-CM

## 2024-10-25 DIAGNOSIS — M1A39X0 Chronic gout due to renal impairment, multiple sites, without tophus (tophi): Secondary | ICD-10-CM

## 2024-10-25 DIAGNOSIS — I890 Lymphedema, not elsewhere classified: Secondary | ICD-10-CM | POA: Diagnosis not present

## 2024-10-25 DIAGNOSIS — L97929 Non-pressure chronic ulcer of unspecified part of left lower leg with unspecified severity: Secondary | ICD-10-CM

## 2024-10-25 DIAGNOSIS — M7541 Impingement syndrome of right shoulder: Secondary | ICD-10-CM

## 2024-10-25 DIAGNOSIS — I7025 Atherosclerosis of native arteries of other extremities with ulceration: Secondary | ICD-10-CM | POA: Diagnosis not present

## 2024-10-25 DIAGNOSIS — Z794 Long term (current) use of insulin: Secondary | ICD-10-CM | POA: Diagnosis not present

## 2024-10-25 DIAGNOSIS — I10 Essential (primary) hypertension: Secondary | ICD-10-CM

## 2024-10-25 DIAGNOSIS — M1711 Unilateral primary osteoarthritis, right knee: Secondary | ICD-10-CM

## 2024-10-25 NOTE — H&P (View-Only) (Signed)
 "        MRN : [id number]  [name] is a 73 y.o. (June 06, 1951) male who presents with chief complaint of legs hurt and swell.  History of Present Illness:  The patient returns to the office for followup of atherosclerotic changes of the lower extremities and review of the noninvasive studies.  He also has severe venous insufficiency and lymphedema.  The patient notes that there has been a significant deterioration in the lower extremity symptoms.  Currently the patient is being followed by the wound center and they have noticed the left leg ulcer has increased in size and does not appear to be healing.  There is no evidence for cellulitis or infection.  The patient denies development of rest pain symptoms. No new ulcers or wounds have occurred since the last visit.  The patient did receive his lymphedema pump and has been using the pump on a routine basis.  He continues to wrap his legs in Ace wrap's.  There have been no significant changes to the patient's overall health care.  The patient denies amaurosis fugax or recent TIA symptoms. There are no recent neurological changes noted. There is no history of DVT, PE or superficial thrombophlebitis. The patient denies recent episodes of angina or shortness of breath.   ABI's done today Lt= [place] (biphasic Doppler signals) (previous ABI's Rt=[place] (triphasic) and Lt=[place] (triphasic))  Duplex US  of the lower extremity venous system done today shows normal deep venous system.  Superficial system is slightly larger than typically seen but there is no evidence of superficial reflux noted.   Active Medications[1]  Past Medical History:  Diagnosis Date   ALLERGIC RHINITIS 08/11/2006   Allergy 1989   Anxiety 2024   Became homeless   Arthritis 2020   ASTHMA 08/11/2006   Bradycardia    Beta blocker d/c'd 2022   Chronic atrial fibrillation (HCC) 08/2014   Chronic combined systolic and diastolic CHF (congestive heart failure) [facility])     Cardiology f/u 12/2017: pt volume overloaded (R heart dysf suspected), BNP very high, lasix  increased.  Repeat echo 12/2017: normal LV EF, mild DD, +RV syst dysfxn, mod pulm HTN, biatrial enlgmt.   Chronic constipation    Chronic renal insufficiency, stage 3 (moderate)    Colon cancer screening 02/2021   02/2021 Cologuard POS->GI ref   Depression 2023   Became homeless   DIABETES MELLITUS, TYPE II 08/11/2006   Heart murmur 1990   AFib   HYPERTENSION 08/11/2006   IDA (iron deficiency anemia)    04/2022.  Iron started.  Needs hemoccults   Impaired mobility and endurance    MRSA infection 06/2018   LL venous stasis ulcer infected   Normocytic anemia 2016-2019   03/2018 B12 normal, iron ok (ferritin borderline low).   OBESITY, MORBID 12/14/2007   saxenda started 05/2020 by [facility] wt mgmt center   OSA (obstructive sleep apnea)    to get sleep study with [facility]) as of 12/03/2018 consult.   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  [id number] ID started him on amoxil  prophylaxis---ineffective.  End 2018/Jan 2019 penicillin  G injections prophyl helpful but pt declined to continue this as of 01/2018 ID f/u.  ID talked him into resuming monthly penicillin  G as of 02/2018 f/u.     Restless leg syndrome    Rx'd clonazepam  09/2017 and pt refused to take it after reading the medication's potential side effects.   Venous  stasis ulcers of both lower extremities (HCC)    Severe lymphedema.  wound clinic care ongoing as of 01/2018    Past Surgical History:  Procedure Laterality Date   Carotid dopplers  07/23/2018   Left NORMAL.  Right 1-39% ICA stenosis, with <50% distal CCA stenosis (not hemodynamically significant)   LE venous dopplers Bilateral 03/06/2022   no dvt   RIGHT HEART CATH N/A 02/19/2022   Marked volume overload, R>>L HF, preserved CO. Procedure: RIGHT HEART CATH;  Surgeon: Cherrie Toribio SAUNDERS, MD;  Location: [facility]  INVASIVE CV LAB;  Service: Cardiovascular;  Laterality: N/A;   TRANSTHORACIC ECHOCARDIOGRAM  11/2007; 09/2014; 11/2015;12/2017   LV fxn normal, EF normal, mild dilation of left atrium.  2015 grade II diast dysfxn.  2017 EF 55-60%. 2019: LVEF 60-65%, mild RV syst dysf,biatrial enlgmt, mod pulm htn. 02/14/22 EF 55-60%, unable to assess diast fxn, LVH, o/w normal   URETERAL STENT PLACEMENT     virtual colonoscopy  01/2011   Normal    Social History Social History[2]  Family History Family History  Problem Relation Age of Onset   Diabetes Mother    Early death Mother    Hearing loss Father    Dementia Sister    Pancreatic cancer Sister    Cancer Maternal Grandmother     Allergies[3]   REVIEW OF SYSTEMS (Negative unless checked)  Constitutional: [] Weight loss  [] Fever  [] Chills Cardiac: [] Chest pain   [] Chest pressure   [] Palpitations   [] Shortness of breath when laying flat   [] Shortness of breath with exertion. Vascular:  [] Pain in legs with walking   [x] Pain in legs at rest  [] History of DVT   [] Phlebitis   [x] Swelling in legs   [] Varicose veins   [] Non-healing ulcers Pulmonary:   [] Uses home oxygen    [] Productive cough   [] Hemoptysis   [] Wheeze  [] COPD   [] Asthma Neurologic:  [] Dizziness   [] Seizures   [] History of stroke   [] History of TIA  [] Aphasia   [] Vissual changes   [] Weakness or numbness in arm   [] Weakness or numbness in leg Musculoskeletal:   [] Joint swelling   [] Joint pain   [] Low back pain Hematologic:  [] Easy bruising  [] Easy bleeding   [] Hypercoagulable state   [] Anemic Gastrointestinal:  [] Diarrhea   [] Vomiting  [] Gastroesophageal reflux/heartburn   [] Difficulty swallowing. Genitourinary:  [] Chronic kidney disease   [] Difficult urination  [] Frequent urination   [] Blood in urine Skin:  [] Rashes   [] Ulcers  Psychological:  [] History of anxiety   []  History of major depression.  Physical Examination  Vitals:   10/25/24 1032  Resp: 17  Weight: (!) 306 lb 12.8 oz  (139.2 kg)  Height: 5' 10 (1.778 m)   Body mass index is 44.02 kg/m. Gen: WD/WN, NAD Head: [place]/AT, No temporalis wasting.  Ear/Nose/Throat: Hearing grossly intact, nares w/o erythema or drainage, pinna without lesions Eyes: PER, EOMI, sclera nonicteric.  Neck: Supple, no gross masses.  No JVD.  Pulmonary:  Good air movement, no audible wheezing, no use of accessory muscles.  Cardiac: RRR, precordium not hyperdynamic. Vascular:  scattered varicosities present bilaterally.  Moderate venous stasis changes to the legs bilaterally.  2+ soft pitting edema. CEAP C4sEpAsPr wound left calf clean Vessel Right Left  Radial Palpable Palpable  Gastrointestinal: soft, non-distended. No guarding/no peritoneal signs.  Musculoskeletal: M/S 5/5 throughout.  No deformity.  Neurologic: CN 2-12 intact. Pain and light touch intact in extremities.  Symmetrical.  Speech is fluent. Motor exam as listed above. Psychiatric:  Judgment intact, Mood & affect appropriate for pt's clinical situation. Dermatologic: Venous rashes + ulcers noted.  No changes consistent with cellulitis. Lymph : No lichenification or skin changes of chronic lymphedema.  CBC Lab Results  Component Value Date   WBC 8.6 10/24/2023   HGB 17.9 (H) 10/24/2023   HCT 55.9 (H) 10/24/2023   MCV 92 10/24/2023   PLT 218 10/24/2023    BMET    Component Value Date/Time   NA 137 01/12/2024 1558   NA 139 10/24/2023 1127   K 5.3 01/12/2024 1558   CL 96 (L) 01/12/2024 1558   CO2 34 (H) 01/12/2024 1558   GLUCOSE 123 (H) 01/12/2024 1558   BUN 31 (H) 01/12/2024 1558   BUN 26 10/24/2023 1127   CREATININE 2.12 (H) 01/12/2024 1558   CALCIUM  9.1 01/12/2024 1558   GFRNONAA 39 (L) 06/30/2023 0555   GFRAA 63 02/09/2020 1453   CrCl cannot be calculated (Patient's most recent lab result is older than the maximum 21 days allowed.).  COAG No results found for: INR, PROTIME  Radiology No results found.   Assessment/Plan 1. Atherosclerosis of  native arteries of the extremities with ulceration (HCC) (Primary)  Recommend:  The patient has evidence of severe atherosclerotic changes of both lower extremities associated with ulceration and tissue loss of the left ankle.  This represents a limb threatening ischemia and places the patient at the risk for left limb loss.  Patient should undergo angiography of the left lower extremity with the hope for intervention for limb salvage.  The risks and benefits as well as the alternative therapies was discussed in detail with the patient.  All questions were answered.  Patient agrees to proceed with left angiography.  The patient will follow up with me in the office after the procedure.   ICD Code: I70.25      Atherosclerotic occlusive disease with ulceration   CPT codes: 62773   stent placement femoral-popliteal artery 36247   introduction catheter below diaphragm third order  2. Venous (peripheral) insufficiency Recommend:  No surgery or intervention at this point in time.    I have reviewed my discussion with the patient regarding lymphedema and why it  causes symptoms.  Patient will continue wearing graduated compression on a daily basis. The patient should put the compression on first thing in the morning and removing them in the evening. The patient should not sleep in the compression.   In addition, behavioral modification throughout the day will be continued.  This will include frequent elevation (such as in a recliner), use of over the counter pain medications as needed and exercise such as walking.  The systemic causes for chronic edema such as liver, kidney and cardiac etiologies does not appear to have significant changed over the past year.    The patient will continue aggressive use of the  lymph pump.  This will continue to improve the edema control and prevent sequela such as ulcers and infections.   The patient will follow-up with me on an annual basis.   3.  Lymphedema Recommend:  No surgery or intervention at this point in time.    I have reviewed my discussion with the patient regarding lymphedema and why it  causes symptoms.  Patient will continue wearing graduated compression on a daily basis. The patient should put the compression on first thing in the morning and removing them in the evening. The patient should not sleep in the compression.   In addition, behavioral modification throughout  the day will be continued.  This will include frequent elevation (such as in a recliner), use of over the counter pain medications as needed and exercise such as walking.  The systemic causes for chronic edema such as liver, kidney and cardiac etiologies does not appear to have significant changed over the past year.    The patient will continue aggressive use of the  lymph pump.  This will continue to improve the edema control and prevent sequela such as ulcers and infections.   The patient will follow-up with me on an annual basis.   4. Type 2 diabetes mellitus with diabetic neuropathy, with long-term current use of insulin  (HCC) Continue hypoglycemic medications as already ordered, these medications have been reviewed and there are no changes at this time.  Hgb A1C to be monitored as already arranged by primary service  5. Hypertension, essential Continue antihypertensive medications as already ordered, these medications have been reviewed and there are no changes at this time.    Cordella Shawl, MD  10/25/2024 10:35 AM      [1]  Current Meds  Medication Sig   anastrozole (ARIMIDEX) 1 MG tablet Take by mouth.   apixaban  (ELIQUIS ) 5 MG TABS tablet Take 5 mg by mouth 2 (two) times daily.   atorvastatin  (LIPITOR) 10 MG tablet Take 1 tablet (10 mg total) by mouth daily.   b complex vitamins capsule Take 1 capsule by mouth every morning.   Continuous Glucose Sensor (DEXCOM G7 SENSOR) MISC Use to check glucose continuously, change sensor  every 10 days   diclofenac  Sodium (VOLTAREN ) 1 % GEL Apply topically.   diltiazem  (TIAZAC ) 120 MG 24 hr capsule Take 1 capsule (120 mg total) by mouth daily.   docusate sodium  (COLACE) 100 MG capsule Take 100 mg by mouth 2 (two) times daily.   EPINEPHrine 0.3 mg/0.3 mL IJ SOAJ injection SMARTSIG:1 Milligram(s) IM Daily   Febuxostat  80 MG TABS Take 1 tablet (80 mg total) by mouth daily. (Patient taking differently: Take 120 mg by mouth daily.)   ferrous sulfate 325 (65 FE) MG tablet Take 325 mg by mouth daily with breakfast.   fluticasone  (FLONASE ) 50 MCG/ACT nasal spray Place 2 sprays into both nostrils daily.   Glucagon  (BAQSIMI  ONE PACK) 3 MG/DOSE POWD Place 1 Device into the nose as needed (Low blood sugar with impaired consciousness).   glucose blood (ONETOUCH ULTRA) test strip CHECK BLOOD SUGAR TWICE  DAILY   HYDROcodone -acetaminophen  (NORCO/VICODIN) 5-325 MG tablet Take 1 tablet by mouth every 6 (six) hours as needed for moderate pain (pain score 4-6).   Insulin  Lispro Prot & Lispro (HUMALOG  75/25 MIX) (75-25) 100 UNIT/ML Kwikpen Inject 45 Units into the skin 2 (two) times daily.   Insulin  Pen Needle (B-D ULTRAFINE III SHORT PEN) 31G X 8 MM MISC USE 1 PENNEEDLE TWICE DAILY   meclizine  (ANTIVERT ) 12.5 MG tablet Take 1 tablet (12.5 mg total) by mouth 3 (three) times daily as needed for dizziness.   metolazone  (ZAROXOLYN ) 5 MG tablet Take 5 mg by mouth daily.   miconazole (MICOTIN) 2 % powder Apply 1 Application topically in the morning and at bedtime. To abdominal fold.   Multiple Vitamin (MULTIVITAMIN) tablet Take 1 tablet by mouth daily.   NYSTATIN  powder Apply topically.   ondansetron  (ZOFRAN ) 8 MG tablet Take by mouth.   Simethicone  80 MG TABS Take 1 tablet (80 mg total) by mouth 3 (three) times daily as needed.   terazosin  (HYTRIN ) 10 MG capsule Take 10 mg  by mouth at bedtime.   testosterone cypionate (DEPOTESTOSTERONE CYPIONATE) 200 MG/ML injection Inject 0.5 mLs into the muscle once a  week. On Friday.   tirzepatide  (MOUNJARO ) 7.5 MG/0.5ML Pen Inject 7.5 mg into the skin once a week.   torsemide  (DEMADEX ) 10 MG tablet Take 40 mg by mouth 2 (two) times daily.   traMADol  (ULTRAM ) 50 MG tablet Take 1 tablet (50 mg total) by mouth in the morning, at noon, and at bedtime.   traZODone (DESYREL) 50 MG tablet Take 50 mg by mouth at bedtime.   Current Facility-Administered Medications for the 10/25/24 encounter (Office Visit) with Jama, Cordella MATSU, MD  Medication   bupivacaine  (MARCAINE ) 0.5 % (with pres) injection 3 mL  [2]  Social History Tobacco Use   Smoking status: Never    Passive exposure: Never   Smokeless tobacco: Never  Vaping Use   Vaping status: Never Used  Substance Use Topics   Alcohol use: Not Currently    Comment: Stopped late 70s   Drug use: Never  [3]  Allergies Allergen Reactions   Other Other (See Comments)    Sneezing, coughing   Hydralazine  Other (See Comments)    Drowsiness/sedation/mental fog   "

## 2024-10-25 NOTE — Progress Notes (Signed)
 "        MRN : [id number]  [name] is a 73 y.o. (June 06, 1951) male who presents with chief complaint of legs hurt and swell.  History of Present Illness:  The patient returns to the office for followup of atherosclerotic changes of the lower extremities and review of the noninvasive studies.  He also has severe venous insufficiency and lymphedema.  The patient notes that there has been a significant deterioration in the lower extremity symptoms.  Currently the patient is being followed by the wound center and they have noticed the left leg ulcer has increased in size and does not appear to be healing.  There is no evidence for cellulitis or infection.  The patient denies development of rest pain symptoms. No new ulcers or wounds have occurred since the last visit.  The patient did receive his lymphedema pump and has been using the pump on a routine basis.  He continues to wrap his legs in Ace wrap's.  There have been no significant changes to the patient's overall health care.  The patient denies amaurosis fugax or recent TIA symptoms. There are no recent neurological changes noted. There is no history of DVT, PE or superficial thrombophlebitis. The patient denies recent episodes of angina or shortness of breath.   ABI's done today Lt= [place] (biphasic Doppler signals) (previous ABI's Rt=[place] (triphasic) and Lt=[place] (triphasic))  Duplex US  of the lower extremity venous system done today shows normal deep venous system.  Superficial system is slightly larger than typically seen but there is no evidence of superficial reflux noted.   Active Medications[1]  Past Medical History:  Diagnosis Date   ALLERGIC RHINITIS 08/11/2006   Allergy 1989   Anxiety 2024   Became homeless   Arthritis 2020   ASTHMA 08/11/2006   Bradycardia    Beta blocker d/c'd 2022   Chronic atrial fibrillation (HCC) 08/2014   Chronic combined systolic and diastolic CHF (congestive heart failure) [facility])     Cardiology f/u 12/2017: pt volume overloaded (R heart dysf suspected), BNP very high, lasix  increased.  Repeat echo 12/2017: normal LV EF, mild DD, +RV syst dysfxn, mod pulm HTN, biatrial enlgmt.   Chronic constipation    Chronic renal insufficiency, stage 3 (moderate)    Colon cancer screening 02/2021   02/2021 Cologuard POS->GI ref   Depression 2023   Became homeless   DIABETES MELLITUS, TYPE II 08/11/2006   Heart murmur 1990   AFib   HYPERTENSION 08/11/2006   IDA (iron deficiency anemia)    04/2022.  Iron started.  Needs hemoccults   Impaired mobility and endurance    MRSA infection 06/2018   LL venous stasis ulcer infected   Normocytic anemia 2016-2019   03/2018 B12 normal, iron ok (ferritin borderline low).   OBESITY, MORBID 12/14/2007   saxenda started 05/2020 by [facility] wt mgmt center   OSA (obstructive sleep apnea)    to get sleep study with [facility]) as of 12/03/2018 consult.   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  [id number] ID started him on amoxil  prophylaxis---ineffective.  End 2018/Jan 2019 penicillin  G injections prophyl helpful but pt declined to continue this as of 01/2018 ID f/u.  ID talked him into resuming monthly penicillin  G as of 02/2018 f/u.     Restless leg syndrome    Rx'd clonazepam  09/2017 and pt refused to take it after reading the medication's potential side effects.   Venous  stasis ulcers of both lower extremities (HCC)    Severe lymphedema.  wound clinic care ongoing as of 01/2018    Past Surgical History:  Procedure Laterality Date   Carotid dopplers  07/23/2018   Left NORMAL.  Right 1-39% ICA stenosis, with <50% distal CCA stenosis (not hemodynamically significant)   LE venous dopplers Bilateral 03/06/2022   no dvt   RIGHT HEART CATH N/A 02/19/2022   Marked volume overload, R>>L HF, preserved CO. Procedure: RIGHT HEART CATH;  Surgeon: Cherrie Toribio SAUNDERS, MD;  Location: [facility]  INVASIVE CV LAB;  Service: Cardiovascular;  Laterality: N/A;   TRANSTHORACIC ECHOCARDIOGRAM  11/2007; 09/2014; 11/2015;12/2017   LV fxn normal, EF normal, mild dilation of left atrium.  2015 grade II diast dysfxn.  2017 EF 55-60%. 2019: LVEF 60-65%, mild RV syst dysf,biatrial enlgmt, mod pulm htn. 02/14/22 EF 55-60%, unable to assess diast fxn, LVH, o/w normal   URETERAL STENT PLACEMENT     virtual colonoscopy  01/2011   Normal    Social History Social History[2]  Family History Family History  Problem Relation Age of Onset   Diabetes Mother    Early death Mother    Hearing loss Father    Dementia Sister    Pancreatic cancer Sister    Cancer Maternal Grandmother     Allergies[3]   REVIEW OF SYSTEMS (Negative unless checked)  Constitutional: [] Weight loss  [] Fever  [] Chills Cardiac: [] Chest pain   [] Chest pressure   [] Palpitations   [] Shortness of breath when laying flat   [] Shortness of breath with exertion. Vascular:  [] Pain in legs with walking   [x] Pain in legs at rest  [] History of DVT   [] Phlebitis   [x] Swelling in legs   [] Varicose veins   [] Non-healing ulcers Pulmonary:   [] Uses home oxygen    [] Productive cough   [] Hemoptysis   [] Wheeze  [] COPD   [] Asthma Neurologic:  [] Dizziness   [] Seizures   [] History of stroke   [] History of TIA  [] Aphasia   [] Vissual changes   [] Weakness or numbness in arm   [] Weakness or numbness in leg Musculoskeletal:   [] Joint swelling   [] Joint pain   [] Low back pain Hematologic:  [] Easy bruising  [] Easy bleeding   [] Hypercoagulable state   [] Anemic Gastrointestinal:  [] Diarrhea   [] Vomiting  [] Gastroesophageal reflux/heartburn   [] Difficulty swallowing. Genitourinary:  [] Chronic kidney disease   [] Difficult urination  [] Frequent urination   [] Blood in urine Skin:  [] Rashes   [] Ulcers  Psychological:  [] History of anxiety   []  History of major depression.  Physical Examination  Vitals:   10/25/24 1032  Resp: 17  Weight: (!) 306 lb 12.8 oz  (139.2 kg)  Height: 5' 10 (1.778 m)   Body mass index is 44.02 kg/m. Gen: WD/WN, NAD Head: [place]/AT, No temporalis wasting.  Ear/Nose/Throat: Hearing grossly intact, nares w/o erythema or drainage, pinna without lesions Eyes: PER, EOMI, sclera nonicteric.  Neck: Supple, no gross masses.  No JVD.  Pulmonary:  Good air movement, no audible wheezing, no use of accessory muscles.  Cardiac: RRR, precordium not hyperdynamic. Vascular:  scattered varicosities present bilaterally.  Moderate venous stasis changes to the legs bilaterally.  2+ soft pitting edema. CEAP C4sEpAsPr wound left calf clean Vessel Right Left  Radial Palpable Palpable  Gastrointestinal: soft, non-distended. No guarding/no peritoneal signs.  Musculoskeletal: M/S 5/5 throughout.  No deformity.  Neurologic: CN 2-12 intact. Pain and light touch intact in extremities.  Symmetrical.  Speech is fluent. Motor exam as listed above. Psychiatric:  Judgment intact, Mood & affect appropriate for pt's clinical situation. Dermatologic: Venous rashes + ulcers noted.  No changes consistent with cellulitis. Lymph : No lichenification or skin changes of chronic lymphedema.  CBC Lab Results  Component Value Date   WBC 8.6 10/24/2023   HGB 17.9 (H) 10/24/2023   HCT 55.9 (H) 10/24/2023   MCV 92 10/24/2023   PLT 218 10/24/2023    BMET    Component Value Date/Time   NA 137 01/12/2024 1558   NA 139 10/24/2023 1127   K 5.3 01/12/2024 1558   CL 96 (L) 01/12/2024 1558   CO2 34 (H) 01/12/2024 1558   GLUCOSE 123 (H) 01/12/2024 1558   BUN 31 (H) 01/12/2024 1558   BUN 26 10/24/2023 1127   CREATININE 2.12 (H) 01/12/2024 1558   CALCIUM  9.1 01/12/2024 1558   GFRNONAA 39 (L) 06/30/2023 0555   GFRAA 63 02/09/2020 1453   CrCl cannot be calculated (Patient's most recent lab result is older than the maximum 21 days allowed.).  COAG No results found for: INR, PROTIME  Radiology No results found.   Assessment/Plan 1. Atherosclerosis of  native arteries of the extremities with ulceration (HCC) (Primary)  Recommend:  The patient has evidence of severe atherosclerotic changes of both lower extremities associated with ulceration and tissue loss of the left ankle.  This represents a limb threatening ischemia and places the patient at the risk for left limb loss.  Patient should undergo angiography of the left lower extremity with the hope for intervention for limb salvage.  The risks and benefits as well as the alternative therapies was discussed in detail with the patient.  All questions were answered.  Patient agrees to proceed with left angiography.  The patient will follow up with me in the office after the procedure.   ICD Code: I70.25      Atherosclerotic occlusive disease with ulceration   CPT codes: 62773   stent placement femoral-popliteal artery 36247   introduction catheter below diaphragm third order  2. Venous (peripheral) insufficiency Recommend:  No surgery or intervention at this point in time.    I have reviewed my discussion with the patient regarding lymphedema and why it  causes symptoms.  Patient will continue wearing graduated compression on a daily basis. The patient should put the compression on first thing in the morning and removing them in the evening. The patient should not sleep in the compression.   In addition, behavioral modification throughout the day will be continued.  This will include frequent elevation (such as in a recliner), use of over the counter pain medications as needed and exercise such as walking.  The systemic causes for chronic edema such as liver, kidney and cardiac etiologies does not appear to have significant changed over the past year.    The patient will continue aggressive use of the  lymph pump.  This will continue to improve the edema control and prevent sequela such as ulcers and infections.   The patient will follow-up with me on an annual basis.   3.  Lymphedema Recommend:  No surgery or intervention at this point in time.    I have reviewed my discussion with the patient regarding lymphedema and why it  causes symptoms.  Patient will continue wearing graduated compression on a daily basis. The patient should put the compression on first thing in the morning and removing them in the evening. The patient should not sleep in the compression.   In addition, behavioral modification throughout  the day will be continued.  This will include frequent elevation (such as in a recliner), use of over the counter pain medications as needed and exercise such as walking.  The systemic causes for chronic edema such as liver, kidney and cardiac etiologies does not appear to have significant changed over the past year.    The patient will continue aggressive use of the  lymph pump.  This will continue to improve the edema control and prevent sequela such as ulcers and infections.   The patient will follow-up with me on an annual basis.   4. Type 2 diabetes mellitus with diabetic neuropathy, with long-term current use of insulin  (HCC) Continue hypoglycemic medications as already ordered, these medications have been reviewed and there are no changes at this time.  Hgb A1C to be monitored as already arranged by primary service  5. Hypertension, essential Continue antihypertensive medications as already ordered, these medications have been reviewed and there are no changes at this time.    Cordella Shawl, MD  10/25/2024 10:35 AM      [1]  Current Meds  Medication Sig   anastrozole (ARIMIDEX) 1 MG tablet Take by mouth.   apixaban  (ELIQUIS ) 5 MG TABS tablet Take 5 mg by mouth 2 (two) times daily.   atorvastatin  (LIPITOR) 10 MG tablet Take 1 tablet (10 mg total) by mouth daily.   b complex vitamins capsule Take 1 capsule by mouth every morning.   Continuous Glucose Sensor (DEXCOM G7 SENSOR) MISC Use to check glucose continuously, change sensor  every 10 days   diclofenac  Sodium (VOLTAREN ) 1 % GEL Apply topically.   diltiazem  (TIAZAC ) 120 MG 24 hr capsule Take 1 capsule (120 mg total) by mouth daily.   docusate sodium  (COLACE) 100 MG capsule Take 100 mg by mouth 2 (two) times daily.   EPINEPHrine 0.3 mg/0.3 mL IJ SOAJ injection SMARTSIG:1 Milligram(s) IM Daily   Febuxostat  80 MG TABS Take 1 tablet (80 mg total) by mouth daily. (Patient taking differently: Take 120 mg by mouth daily.)   ferrous sulfate 325 (65 FE) MG tablet Take 325 mg by mouth daily with breakfast.   fluticasone  (FLONASE ) 50 MCG/ACT nasal spray Place 2 sprays into both nostrils daily.   Glucagon  (BAQSIMI  ONE PACK) 3 MG/DOSE POWD Place 1 Device into the nose as needed (Low blood sugar with impaired consciousness).   glucose blood (ONETOUCH ULTRA) test strip CHECK BLOOD SUGAR TWICE  DAILY   HYDROcodone -acetaminophen  (NORCO/VICODIN) 5-325 MG tablet Take 1 tablet by mouth every 6 (six) hours as needed for moderate pain (pain score 4-6).   Insulin  Lispro Prot & Lispro (HUMALOG  75/25 MIX) (75-25) 100 UNIT/ML Kwikpen Inject 45 Units into the skin 2 (two) times daily.   Insulin  Pen Needle (B-D ULTRAFINE III SHORT PEN) 31G X 8 MM MISC USE 1 PENNEEDLE TWICE DAILY   meclizine  (ANTIVERT ) 12.5 MG tablet Take 1 tablet (12.5 mg total) by mouth 3 (three) times daily as needed for dizziness.   metolazone  (ZAROXOLYN ) 5 MG tablet Take 5 mg by mouth daily.   miconazole (MICOTIN) 2 % powder Apply 1 Application topically in the morning and at bedtime. To abdominal fold.   Multiple Vitamin (MULTIVITAMIN) tablet Take 1 tablet by mouth daily.   NYSTATIN  powder Apply topically.   ondansetron  (ZOFRAN ) 8 MG tablet Take by mouth.   Simethicone  80 MG TABS Take 1 tablet (80 mg total) by mouth 3 (three) times daily as needed.   terazosin  (HYTRIN ) 10 MG capsule Take 10 mg  by mouth at bedtime.   testosterone cypionate (DEPOTESTOSTERONE CYPIONATE) 200 MG/ML injection Inject 0.5 mLs into the muscle once a  week. On Friday.   tirzepatide  (MOUNJARO ) 7.5 MG/0.5ML Pen Inject 7.5 mg into the skin once a week.   torsemide  (DEMADEX ) 10 MG tablet Take 40 mg by mouth 2 (two) times daily.   traMADol  (ULTRAM ) 50 MG tablet Take 1 tablet (50 mg total) by mouth in the morning, at noon, and at bedtime.   traZODone (DESYREL) 50 MG tablet Take 50 mg by mouth at bedtime.   Current Facility-Administered Medications for the 10/25/24 encounter (Office Visit) with Jama, Cordella MATSU, MD  Medication   bupivacaine  (MARCAINE ) 0.5 % (with pres) injection 3 mL  [2]  Social History Tobacco Use   Smoking status: Never    Passive exposure: Never   Smokeless tobacco: Never  Vaping Use   Vaping status: Never Used  Substance Use Topics   Alcohol use: Not Currently    Comment: Stopped late 70s   Drug use: Never  [3]  Allergies Allergen Reactions   Other Other (See Comments)    Sneezing, coughing   Hydralazine  Other (See Comments)    Drowsiness/sedation/mental fog   "

## 2024-10-26 ENCOUNTER — Telehealth (INDEPENDENT_AMBULATORY_CARE_PROVIDER_SITE_OTHER): Payer: Self-pay

## 2024-10-26 ENCOUNTER — Telehealth (HOSPITAL_BASED_OUTPATIENT_CLINIC_OR_DEPARTMENT_OTHER): Payer: Self-pay | Admitting: *Deleted

## 2024-10-26 NOTE — Telephone Encounter (Signed)
 Routing to Dr. De Cuba for review. Please advise on this.

## 2024-10-26 NOTE — Telephone Encounter (Signed)
 Copied from CRM [id number]. Topic: Clinical - Request for Lab/Test Order >> Oct 26, 2024  9:47 AM Rosaria BRAVO wrote: Reason for CRM: Pt called for update on order request for UTI testing, states his facility has not yet received anything from his PCP's office. Please advise  Pt wants to confirm whether order request was received or not from 12/18. Please advise   Still very symptomatic  Best contact: [phone]

## 2024-10-26 NOTE — Telephone Encounter (Signed)
 Spoke with the patient to schedule him for his left leg angio with possible intervention with Dr. Jama. Patient was scheduled for a 11/17/23 with a 6:45 am arrival time to the [facility]. Pre-procedure instructions were discussed and will be faxed to [facility] rehab attn Maryellen per the patient request.

## 2024-10-29 ENCOUNTER — Telehealth (HOSPITAL_BASED_OUTPATIENT_CLINIC_OR_DEPARTMENT_OTHER): Payer: Self-pay | Admitting: Family Medicine

## 2024-10-29 NOTE — Telephone Encounter (Signed)
 Called and spoke with pt who stated he is going to be able to come to appt on 1/2.

## 2024-10-29 NOTE — Telephone Encounter (Signed)
 Called and spoke with pt letting him know info per Dr. De Cuba. Pt does not want to got to ED. Appt scheduled 1/2 as pt needs at least 72 hours notice for transportation. Stated to pt if symptoms become worse prior to appt that he needs to be evaluated. Understanding verbalized.

## 2024-10-29 NOTE — Telephone Encounter (Signed)
 Copied from CRM [id number]. Topic: General - Other >> Oct 27, 2024 10:14 AM Kevelyn M wrote: Reason for CRM: Patient calling back because he hasn't heard back from the office in regards to a UTI test that was to be administered at his facility. He stated his has not received a response for the office. Patient is still experiencing symptoms. Patient refuses to go to the ER or Urgent care. Patient was upset because the office is closed. He disconnected.

## 2024-10-29 NOTE — Telephone Encounter (Signed)
 Copied from CRM #[id number]. Topic: Appointments - Appointment Cancel/Reschedule >> Oct 29, 2024  9:48 AM Sophia H wrote: Patient/patient representative is calling to cancel or reschedule an appointment. Refer to attachments for appointment information.   Requesting appt within 72 hrs - has an appt on Jan 2 but is having issues with transportation through his insurance. I already advised the patient that the 2nd was the soonest appt in clinic and he is wanting to know if clinic provides transportation at all. No answer when I called [facility] - please reach out and clarify. Pt believes he has a UTI, can not push appt out further. TY # [id number]

## 2024-11-01 NOTE — Progress Notes (Signed)
 "  Office Visit Note  Patient: [name]             Date of Birth: 09-17-1951           MRN: [id number]             Visit Date: 11/11/2024  PCP: de Cuba, Raymond J, MD   Subjective:   Chief Complaint: Gout (Follow up gout)   History of Present Illness: [name] is a 73 y.o. male who is here for follow up of chronic gout. He has had no gout flares since last visit. He is taking Uloric  80 mg daily without side effects. Most recent uric acid level was 7.1. Of note, he is having vascular surgery next week on his lower right extremity to remove plaque from an artery. He is seeing his nephrologist next week for routine follow up.  Review of Systems: Review of Systems  Constitutional:  Positive for fatigue.  HENT:  Positive for mouth dryness. Negative for mouth sores.   Eyes:  Negative for dryness.  Respiratory:  Negative for shortness of breath.   Cardiovascular:  Negative for chest pain and palpitations.  Gastrointestinal:  Negative for blood in stool, constipation and diarrhea.  Endocrine: Negative for increased urination.  Genitourinary:  Negative for involuntary urination.  Musculoskeletal:  Negative for joint pain, gait problem, joint pain, joint swelling, myalgias, muscle weakness, morning stiffness, muscle tenderness and myalgias.  Skin:  Negative for color change, rash, hair loss and sensitivity to sunlight.  Allergic/Immunologic: Negative for susceptible to infections.  Neurological:  Negative for dizziness and headaches.  Hematological:  Negative for swollen glands.  Psychiatric/Behavioral:  Positive for depressed mood and sleep disturbance. The patient is nervous/anxious.     Objective: Vital Signs: BP 97/61   Pulse 62   Temp (!) 97.5 F (36.4 C)   Resp 12   Ht 5' 10 (1.778 m)   Wt 298 lb (135.2 kg)   BMI 42.76 kg/m   Physical Exam HENT:     Head: Normocephalic and atraumatic.  Eyes:     Extraocular Movements: Extraocular movements intact.      Conjunctiva/sclera: Conjunctivae normal.     Pupils: Pupils are equal, round, and reactive to light.  Pulmonary:     Effort: Pulmonary effort is normal.  Musculoskeletal:     Right lower leg: Edema present.     Left lower leg: Edema present.  Skin:    General: Skin is warm.  Neurological:     Mental Status: He is alert.  Psychiatric:        Mood and Affect: Mood normal.        Behavior: Behavior normal.      CDAI Exam:  CDAI Score: -- Patient Global: --; Provider Global: -- Swollen: --; Tender: --  Joint Exam 11/11/2024   No joint exam has been documented for this visit   There is currently no information documented on the homunculus. Go to the Rheumatology activity and complete the homunculus joint exam.  Imaging: No results found.  Problem List:  Patient Active Problem List   Diagnosis Date Noted   Atherosclerosis of native arteries of the extremities with ulceration (HCC) 10/25/2024   Diabetes mellitus (HCC) 09/02/2024   Decreased hearing 01/19/2024   CKD (chronic kidney disease) 11/28/2023   Urinary frequency 09/17/2023   Pain in right wrist 08/19/2023   Chronic gout due to renal impairment 08/19/2023   Onychomycosis 08/19/2023   Hypokalemia 06/26/2023   Primary osteoarthritis  of right knee 06/04/2022   Primary osteoarthritis of left knee 06/04/2022   Severe obesity (BMI >= 40) (HCC) 06/04/2022   Hyponatremia 04/23/2022   Pressure injury of skin 03/07/2022   Obesity, Class III, BMI 40-49.9 (morbid obesity) (HCC)    Chronic diastolic CHF (congestive heart failure) (HCC) 12/24/2016   Lymphedema 02/22/2016   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   Chronic headaches 05/10/2015   Persistent atrial fibrillation (HCC) 08/15/2014   Type 2 diabetes mellitus with diabetic neuropathy (HCC) 08/15/2014   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   Recurrent cellulitis of lower extremity 08/02/2013   Lymphedema of both lower extremities 11/04/2008   Morbid obesity  due to excess calories (HCC) 12/14/2007   Venous (peripheral) insufficiency 07/22/2007   Hypertension, essential 08/11/2006   ALLERGIC RHINITIS 08/11/2006   Asthma 08/11/2006   PMFS History:  History: Past Medical History:  Diagnosis Date   ALLERGIC RHINITIS 08/11/2006   Allergy 1989   Anxiety 2024   Became homeless   Arthritis 2020   ASTHMA 08/11/2006   Bradycardia    Beta blocker d/c'd 2022   Chronic atrial fibrillation (HCC) 08/2014   Chronic combined systolic and diastolic CHF (congestive heart failure) [facility])    Cardiology f/u 12/2017: pt volume overloaded (R heart dysf suspected), BNP very high, lasix  increased.  Repeat echo 12/2017: normal LV EF, mild DD, +RV syst dysfxn, mod pulm HTN, biatrial enlgmt.   Chronic constipation    Chronic renal insufficiency, stage 3 (moderate)    Colon cancer screening 02/2021   02/2021 Cologuard POS->GI ref   Depression 2023   Became homeless   DIABETES MELLITUS, TYPE II 08/11/2006   Heart murmur 1990   AFib   HYPERTENSION 08/11/2006   IDA (iron deficiency anemia)    04/2022.  Iron started.  Needs hemoccults   Impaired mobility and endurance    MRSA infection 06/2018   LL venous stasis ulcer infected   Normocytic anemia 2016-2019   03/2018 B12 normal, iron ok (ferritin borderline low).   OBESITY, MORBID 12/14/2007   saxenda started 05/2020 by [facility] wt mgmt center   OSA (obstructive sleep apnea)    to get sleep study with [facility]) as of 12/03/2018 consult.   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  [id number] ID started him on amoxil  prophylaxis---ineffective.  End 2018/Jan 2019 penicillin  G injections prophyl helpful but pt declined to continue this as of 01/2018 ID f/u.  ID talked him into resuming monthly penicillin  G as of 02/2018 f/u.     Restless leg syndrome    Rx'd clonazepam  09/2017 and pt refused to take it after reading the medication's potential side  effects.   Venous stasis ulcers of both lower extremities (HCC)    Severe lymphedema.  wound clinic care ongoing as of 01/2018    Family History  Problem Relation Age of Onset   Diabetes Mother    Early death Mother    Hearing loss Father    Dementia Sister    Pancreatic cancer Sister    Cancer Maternal Grandmother    Past Surgical History:  Procedure Laterality Date   Carotid dopplers  07/23/2018   Left NORMAL.  Right 1-39% ICA stenosis, with <50% distal CCA stenosis (not hemodynamically significant)   LE venous dopplers Bilateral 03/06/2022   no dvt   RIGHT HEART CATH N/A 02/19/2022   Marked volume overload, R>>L HF, preserved CO. Procedure: RIGHT HEART CATH;  Surgeon: Bensimhon,  Toribio SAUNDERS, MD;  Location: [facility] INVASIVE CV LAB;  Service: Cardiovascular;  Laterality: N/A;   TRANSTHORACIC ECHOCARDIOGRAM  11/2007; 09/2014; 11/2015;12/2017   LV fxn normal, EF normal, mild dilation of left atrium.  2015 grade II diast dysfxn.  2017 EF 55-60%. 2019: LVEF 60-65%, mild RV syst dysf,biatrial enlgmt, mod pulm htn. 02/14/22 EF 55-60%, unable to assess diast fxn, LVH, o/w normal   URETERAL STENT PLACEMENT     virtual colonoscopy  01/2011   Normal   Social History   Social History Narrative         Allergies: Other and Hydralazine   Immunization status:  Immunization History  Administered Date(s) Administered   Fluad Quad(high Dose 65+) 07/06/2019, 07/12/2020, 07/11/2021, 07/10/2022   Fluad Trivalent(High Dose 65+) 08/04/2023   INFLUENZA, HIGH DOSE SEASONAL PF 08/05/2016, 09/08/2018   Influenza Split 08/19/2011, 08/11/2012   Influenza Whole 08/11/2006, 08/17/2008, 08/08/2009, 09/17/2010   Influenza,inj,Quad PF,6+ Mos 07/26/2013, 09/11/2015, 09/15/2017   PFIZER(Purple Top)SARS-COV-2 Vaccination 12/14/2019, 01/10/2020, 09/15/2020   Pneumococcal Conjugate-13 08/05/2016   Pneumococcal Polysaccharide-23 04/22/2011, 12/08/2017   Td 04/10/2009   Zoster, Live 04/22/2011     Assessment:  Visit  Diagnoses:  1. Chronic gout due to renal impairment of multiple sites without tophus   2. Lymphedema of both lower extremities   3. Impingement syndrome of right shoulder     Chronic gout due to renal impairment without tophi Uric acid level at 7.1 on 07/07/2024. Currently on Uloric  80 mg daily, achieving target. No recent flare-ups. Long-term uric acid control crucial for joint health. Patient has appointment next week with his nephrologist. They will check his uric acid levels. - Continue Uloric  80 mg daily  Impingement syndrome of right shoulder  Symptoms remain the same. He continues to do his exercises.  -Continue Tylenol  and lidocaine  patches as needed for pain. -Continue shoulder stretches and exercises.     Follow-Up Instructions:  Return in about 6 months (around 05/11/2025) for gout.   Orders: No orders of the defined types were placed in this encounter.   No orders of the defined types were placed in this encounter.     Procedures: No procedures performed  Daved GORMAN Holstein, PA-C  Note - This record has been created using [employer]. Chart creation errors have been sought, but may not always have been located. Such creation errors do not reflect on the standard of medical care.  "

## 2024-11-05 ENCOUNTER — Ambulatory Visit (INDEPENDENT_AMBULATORY_CARE_PROVIDER_SITE_OTHER): Admitting: Family Medicine

## 2024-11-05 ENCOUNTER — Encounter (HOSPITAL_BASED_OUTPATIENT_CLINIC_OR_DEPARTMENT_OTHER): Payer: Self-pay | Admitting: Family Medicine

## 2024-11-05 VITALS — BP 119/87 | HR 83 | Temp 97.5°F | Resp 20 | Ht 70.0 in | Wt 302.0 lb

## 2024-11-05 DIAGNOSIS — R300 Dysuria: Secondary | ICD-10-CM | POA: Diagnosis not present

## 2024-11-05 DIAGNOSIS — E1142 Type 2 diabetes mellitus with diabetic polyneuropathy: Secondary | ICD-10-CM | POA: Diagnosis not present

## 2024-11-05 DIAGNOSIS — Z794 Long term (current) use of insulin: Secondary | ICD-10-CM | POA: Diagnosis not present

## 2024-11-05 DIAGNOSIS — R350 Frequency of micturition: Secondary | ICD-10-CM

## 2024-11-05 DIAGNOSIS — N1832 Chronic kidney disease, stage 3b: Secondary | ICD-10-CM | POA: Diagnosis not present

## 2024-11-05 DIAGNOSIS — R82998 Other abnormal findings in urine: Secondary | ICD-10-CM | POA: Diagnosis not present

## 2024-11-05 LAB — POCT URINALYSIS DIP (CLINITEK)
Bilirubin, UA: NEGATIVE
Glucose, UA: 500 mg/dL — AB
Ketones, POC UA: NEGATIVE mg/dL
Nitrite, UA: NEGATIVE
POC PROTEIN,UA: NEGATIVE
Spec Grav, UA: 1.01
Urobilinogen, UA: 0.2 U/dL
pH, UA: 6

## 2024-11-05 LAB — BASIC METABOLIC PANEL WITH GFR
BUN/Creatinine Ratio: 14 (ref 10–24)
BUN: 54 mg/dL — ABNORMAL HIGH (ref 8–27)
CO2: 26 mmol/L (ref 20–29)
Calcium: 9 mg/dL (ref 8.6–10.2)
Chloride: 83 mmol/L — ABNORMAL LOW (ref 96–106)
Creatinine, Ser: 4 mg/dL — ABNORMAL HIGH (ref 0.76–1.27)
Glucose: 178 mg/dL — ABNORMAL HIGH (ref 70–99)
Potassium: 4.3 mmol/L (ref 3.5–5.2)
Sodium: 134 mmol/L (ref 134–144)
eGFR: 15 mL/min/1.73 — ABNORMAL LOW

## 2024-11-05 NOTE — Progress Notes (Signed)
 "   Procedures performed today:    None.  Independent interpretation of notes and tests performed by another provider:   None.  Brief History, Exam, Impression, and Recommendations:    BP 119/87 (BP Location: Left Arm, Patient Position: Sitting, Cuff Size: Large)   Pulse 83   Temp (!) 97.5 F (36.4 C) (Oral)   Resp 20   Ht 5' 10 (1.778 m)   Wt (!) 302 lb (137 kg)   SpO2 95%   BMI 43.33 kg/m   Discussed the use of AI scribe software for clinical note transcription with the patient, who gave verbal consent to proceed.  History of Present Illness [name] is a 74 year old male who presents with urinary frequency and incomplete bladder emptying.  He experiences urinary frequency, particularly nocturia, waking almost every hour to urinate. He feels incomplete bladder emptying and has to 'push' to feel relief. These symptoms are new. No recent medication changes except switching from Trulicity  to Mounjaro . He takes torsemide  and Jardiance . His last urology appointment was about a year ago.  He reports cold-induced hand cramping, where his hands 'claw up' and require warming under his arms for relief. This is painful and makes finger straightening difficult. No change in finger color during these episodes.  He describes symptoms resembling restless leg syndrome, with intense itching around his knees, mostly at night until around 3 or 4 AM, after which he sleeps well.  Urinary frequency Assessment & Plan: As outlined above, patient with increased frequency, difficulty with urination, urinary pressure.  No recent fever, chills, sweats. We discussed considerations, can proceed with urinalysis today.  Urine dip in office is not strongly suggestive of UTI and we will proceed with urine culture for further evaluation.  As noted by his urologist in the past, patient has acquired phimosis with a buried penis and thus voided urine is likely to always be contaminated Recommendation is  for patient to follow-up with his urologist to determine if further testing is warranted - he is overdue for follow-up at this time as last visit was about 1 year ago.  He does indicate that he will contact his urology office in order to schedule appointment with them. We will follow-up on urine culture result   Dysuria -     POCT URINALYSIS DIP (CLINITEK)  Leukocytes in urine -     Urine Culture  Type 2 diabetes mellitus with diabetic polyneuropathy, with long-term current use of insulin  [facility]) Assessment & Plan: Patient continues with insulin  and Mounjaro .  Mounjaro  was started by endocrinology.  He reports that he is doing well with medications.  Most recent A1c with endocrinology was at goal about 2 months ago. Given progress, can continue with current medication regimen, no changes today.  Recommend continued follow-up with endocrinology as scheduled He is up-to-date with urine ACR and foot exam - we can plan to check these later in the year   Stage 3b chronic kidney disease [facility]) Assessment & Plan: Follows with  kidney.  No new concerns today.  Will need to be mindful of underlying CKD when treating separate comorbidities and medications being utilized.  Recommend continued follow-up with [facility] for monitoring.  Orders: -     Basic metabolic panel with GFR  Severe obesity (BMI >= 40) (HCC) Assessment & Plan: BMI notably elevated, complicated by sedentary lifestyle which is multifactorial.  Currently utilizing GLP-1 related to underlying diabetes.  Discussed risk and benefit with regard to weight management as well.  Discussed lifestyle modifications, can continue with GLP-1 medication as well.  Will monitor weight at follow-up office visits   Return in about 3 months (around 02/03/2025).  Spent 43 minutes on this patient encounter, including preparation, chart review, face-to-face counseling with patient and coordination of care, and documentation of  encounter   ___________________________________________ Kevin Mayabb de Cuba, MD, ABFM, CAQSM Primary Care and Sports Medicine [facility] "

## 2024-11-09 ENCOUNTER — Ambulatory Visit (HOSPITAL_BASED_OUTPATIENT_CLINIC_OR_DEPARTMENT_OTHER): Payer: Self-pay | Admitting: Family Medicine

## 2024-11-09 LAB — URINE CULTURE

## 2024-11-09 MED ORDER — AMOXICILLIN 500 MG PO TABS: 500.0000 mg | ORAL_TABLET | Freq: Two times a day (BID) | ORAL | 0 refills | Status: DC

## 2024-11-09 NOTE — Assessment & Plan Note (Signed)
 Patient continues with insulin  and Mounjaro .  Mounjaro  was started by endocrinology.  He reports that he is doing well with medications.  Most recent A1c with endocrinology was at goal about 2 months ago. Given progress, can continue with current medication regimen, no changes today.  Recommend continued follow-up with endocrinology as scheduled He is up-to-date with urine ACR and foot exam - we can plan to check these later in the year

## 2024-11-09 NOTE — Assessment & Plan Note (Signed)
 Follows with [facility].  No new concerns today.  Will need to be mindful of underlying CKD when treating separate comorbidities and medications being utilized.  Recommend continued follow-up with [facility] for monitoring.

## 2024-11-09 NOTE — Assessment & Plan Note (Signed)
 As outlined above, patient with increased frequency, difficulty with urination, urinary pressure.  No recent fever, chills, sweats. We discussed considerations, can proceed with urinalysis today.  Urine dip in office is not strongly suggestive of UTI and we will proceed with urine culture for further evaluation.  As noted by his urologist in the past, patient has acquired phimosis with a buried penis and thus voided urine is likely to always be contaminated Recommendation is for patient to follow-up with his urologist to determine if further testing is warranted - he is overdue for follow-up at this time as last visit was about 1 year ago.  He does indicate that he will contact his urology office in order to schedule appointment with them. We will follow-up on urine culture result

## 2024-11-09 NOTE — Assessment & Plan Note (Signed)
 BMI notably elevated, complicated by sedentary lifestyle which is multifactorial.  Currently utilizing GLP-1 related to underlying diabetes.  Discussed risk and benefit with regard to weight management as well. Discussed lifestyle modifications, can continue with GLP-1 medication as well.  Will monitor weight at follow-up office visits

## 2024-11-11 ENCOUNTER — Ambulatory Visit

## 2024-11-11 VITALS — BP 97/61 | HR 62 | Temp 97.5°F | Resp 12 | Ht 70.0 in | Wt 298.0 lb

## 2024-11-11 DIAGNOSIS — M1A39X0 Chronic gout due to renal impairment, multiple sites, without tophus (tophi): Secondary | ICD-10-CM | POA: Diagnosis not present

## 2024-11-11 DIAGNOSIS — I890 Lymphedema, not elsewhere classified: Secondary | ICD-10-CM

## 2024-11-11 DIAGNOSIS — M7541 Impingement syndrome of right shoulder: Secondary | ICD-10-CM

## 2024-11-16 ENCOUNTER — Ambulatory Visit
Admission: RE | Admit: 2024-11-16 | Discharge: 2024-11-16 | Disposition: A | Discharge status: Skilled Nursing Facility | Attending: Vascular Surgery | Admitting: Vascular Surgery

## 2024-11-16 ENCOUNTER — Other Ambulatory Visit: Payer: Self-pay

## 2024-11-16 ENCOUNTER — Encounter
Admission: RE | Disposition: A | Discharge status: Skilled Nursing Facility | Payer: Self-pay | Source: Home / Self Care | Attending: Vascular Surgery

## 2024-11-16 ENCOUNTER — Encounter: Payer: Self-pay | Admitting: Vascular Surgery

## 2024-11-16 DIAGNOSIS — I872 Venous insufficiency (chronic) (peripheral): Secondary | ICD-10-CM | POA: Insufficient documentation

## 2024-11-16 DIAGNOSIS — Z794 Long term (current) use of insulin: Secondary | ICD-10-CM | POA: Insufficient documentation

## 2024-11-16 DIAGNOSIS — N183 Chronic kidney disease, stage 3 unspecified: Secondary | ICD-10-CM | POA: Insufficient documentation

## 2024-11-16 DIAGNOSIS — E11622 Type 2 diabetes mellitus with other skin ulcer: Secondary | ICD-10-CM | POA: Insufficient documentation

## 2024-11-16 DIAGNOSIS — I70243 Atherosclerosis of native arteries of left leg with ulceration of ankle: Secondary | ICD-10-CM | POA: Diagnosis not present

## 2024-11-16 DIAGNOSIS — E1151 Type 2 diabetes mellitus with diabetic peripheral angiopathy without gangrene: Secondary | ICD-10-CM | POA: Diagnosis not present

## 2024-11-16 DIAGNOSIS — L98491 Non-pressure chronic ulcer of skin of other sites limited to breakdown of skin: Secondary | ICD-10-CM | POA: Insufficient documentation

## 2024-11-16 DIAGNOSIS — E1140 Type 2 diabetes mellitus with diabetic neuropathy, unspecified: Secondary | ICD-10-CM | POA: Insufficient documentation

## 2024-11-16 DIAGNOSIS — I130 Hypertensive heart and chronic kidney disease with heart failure and stage 1 through stage 4 chronic kidney disease, or unspecified chronic kidney disease: Secondary | ICD-10-CM | POA: Insufficient documentation

## 2024-11-16 DIAGNOSIS — I5042 Chronic combined systolic (congestive) and diastolic (congestive) heart failure: Secondary | ICD-10-CM | POA: Insufficient documentation

## 2024-11-16 DIAGNOSIS — E1122 Type 2 diabetes mellitus with diabetic chronic kidney disease: Secondary | ICD-10-CM | POA: Insufficient documentation

## 2024-11-16 DIAGNOSIS — I7025 Atherosclerosis of native arteries of other extremities with ulceration: Secondary | ICD-10-CM | POA: Diagnosis present

## 2024-11-16 DIAGNOSIS — L97329 Non-pressure chronic ulcer of left ankle with unspecified severity: Secondary | ICD-10-CM | POA: Diagnosis not present

## 2024-11-16 DIAGNOSIS — Z7985 Long-term (current) use of injectable non-insulin antidiabetic drugs: Secondary | ICD-10-CM | POA: Insufficient documentation

## 2024-11-16 DIAGNOSIS — I890 Lymphedema, not elsewhere classified: Secondary | ICD-10-CM | POA: Insufficient documentation

## 2024-11-16 DIAGNOSIS — I70299 Other atherosclerosis of native arteries of extremities, unspecified extremity: Secondary | ICD-10-CM

## 2024-11-16 HISTORY — PX: LOWER EXTREMITY INTERVENTION: CATH118252

## 2024-11-16 HISTORY — PX: LOWER EXTREMITY ANGIOGRAPHY: CATH118251

## 2024-11-16 LAB — CREATININE, SERUM
Creatinine, Ser: 3.02 mg/dL — ABNORMAL HIGH (ref 0.61–1.24)
GFR, Estimated: 21 mL/min — ABNORMAL LOW

## 2024-11-16 LAB — GLUCOSE, CAPILLARY: Glucose-Capillary: 198 mg/dL — ABNORMAL HIGH (ref 70–99)

## 2024-11-16 LAB — BUN: BUN: 44 mg/dL — ABNORMAL HIGH (ref 8–23)

## 2024-11-16 MED ORDER — SODIUM CHLORIDE 0.9 % IV SOLN: INTRAVENOUS | Status: DC

## 2024-11-16 MED ORDER — SODIUM CHLORIDE 0.9 % IV SOLN: 250.0000 mL | INTRAVENOUS | Status: DC | PRN

## 2024-11-16 MED ORDER — ONDANSETRON HCL 4 MG/2ML IJ SOLN: 4.0000 mg | Freq: Four times a day (QID) | INTRAMUSCULAR | Status: DC | PRN

## 2024-11-16 MED ORDER — MIDAZOLAM HCL 2 MG/ML PO SYRP: 8.0000 mg | ORAL_SOLUTION | Freq: Once | ORAL | Status: DC | PRN

## 2024-11-16 MED ORDER — FENTANYL CITRATE (PF) 100 MCG/2ML IJ SOLN
INTRAMUSCULAR | Status: DC | PRN
  Administered 2024-11-16 (×2): 50 ug via INTRAVENOUS

## 2024-11-16 MED ORDER — HEPARIN (PORCINE) IN NACL 1000-0.9 UT/500ML-% IV SOLN
INTRAVENOUS | Status: DC | PRN
  Administered 2024-11-16: 1500 mL

## 2024-11-16 MED ORDER — HEPARIN SODIUM (PORCINE) 1000 UNIT/ML IJ SOLN
INTRAMUSCULAR | Status: AC
  Filled 2024-11-16: qty 10

## 2024-11-16 MED ORDER — HYDROMORPHONE HCL 1 MG/ML IJ SOLN: 1.0000 mg | Freq: Once | INTRAMUSCULAR | Status: DC | PRN

## 2024-11-16 MED ORDER — MORPHINE SULFATE (PF) 2 MG/ML IV SOLN: 2.0000 mg | INTRAVENOUS | Status: DC | PRN

## 2024-11-16 MED ORDER — IODIXANOL 320 MG/ML IV SOLN
INTRAVENOUS | Status: DC | PRN
  Administered 2024-11-16: 40 mL via INTRA_ARTERIAL

## 2024-11-16 MED ORDER — ACETAMINOPHEN 325 MG PO TABS: 650.0000 mg | ORAL_TABLET | ORAL | Status: DC | PRN

## 2024-11-16 MED ORDER — FENTANYL CITRATE (PF) 100 MCG/2ML IJ SOLN
INTRAMUSCULAR | Status: AC
  Filled 2024-11-16: qty 2

## 2024-11-16 MED ORDER — MIDAZOLAM HCL 5 MG/5ML IJ SOLN
INTRAMUSCULAR | Status: AC
  Filled 2024-11-16: qty 5

## 2024-11-16 MED ORDER — SODIUM CHLORIDE 0.9% FLUSH: 3.0000 mL | Freq: Two times a day (BID) | INTRAVENOUS | Status: DC

## 2024-11-16 MED ORDER — DIPHENHYDRAMINE HCL 50 MG/ML IJ SOLN: 50.0000 mg | Freq: Once | INTRAMUSCULAR | Status: DC | PRN

## 2024-11-16 MED ORDER — MIDAZOLAM HCL (PF) 2 MG/2ML IJ SOLN
INTRAMUSCULAR | Status: DC | PRN
  Administered 2024-11-16 (×2): 1 mg via INTRAVENOUS

## 2024-11-16 MED ORDER — SODIUM CHLORIDE 0.9% FLUSH: 3.0000 mL | INTRAVENOUS | Status: DC | PRN

## 2024-11-16 MED ORDER — LIDOCAINE HCL (PF) 1 % IJ SOLN
INTRAMUSCULAR | Status: DC | PRN
  Administered 2024-11-16: 10 mL via INTRADERMAL

## 2024-11-16 MED ORDER — METHYLPREDNISOLONE SODIUM SUCC 125 MG IJ SOLR: 125.0000 mg | Freq: Once | INTRAMUSCULAR | Status: DC | PRN

## 2024-11-16 MED ORDER — LABETALOL HCL 5 MG/ML IV SOLN: 10.0000 mg | INTRAVENOUS | Status: DC | PRN

## 2024-11-16 MED ORDER — CEFAZOLIN SODIUM-DEXTROSE 3-4 GM/150ML-% IV SOLN
3.0000 g | INTRAVENOUS | Status: AC
  Administered 2024-11-16: 3 g via INTRAVENOUS
  Filled 2024-11-16: qty 150

## 2024-11-16 MED ORDER — OXYCODONE HCL 5 MG PO TABS: 5.0000 mg | ORAL_TABLET | ORAL | Status: DC | PRN

## 2024-11-16 MED ORDER — FAMOTIDINE 20 MG PO TABS: 40.0000 mg | ORAL_TABLET | Freq: Once | ORAL | Status: DC | PRN

## 2024-11-16 NOTE — Interval H&P Note (Signed)
 History and Physical Interval Note:  11/16/2024 8:23 AM  [name]  has presented today for surgery, with the diagnosis of LLE Angio w possible intervention   ASO w ulceration.  The various methods of treatment have been discussed with the patient and family. After consideration of risks, benefits and other options for treatment, the patient has consented to  Procedures: LOWER EXTREMITY INTERVENTION (Left) Lower Extremity Angiography (Left) as a surgical intervention.  The patient's history has been reviewed, patient examined, no change in status, stable for surgery.  I have reviewed the patient's chart and labs.  Questions were answered to the patient's satisfaction.     Cordella Shawl

## 2024-11-16 NOTE — Op Note (Signed)
 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  Abdominal aortogram  2.  Selective injection of the left lower extremity third order catheter placement  3.  Ultrasound-guided access to the right common femoral artery  4.  StarClose right femoral artery    Anesthesia: Conscious sedation was administered by the interventional radiology RN under my direct supervision. IV Versed  plus fentanyl  were utilized. Continuous ECG, pulse oximetry and blood pressure was monitored throughout the entire procedure.  Conscious sedation was administered for a total of 35 minutes.  Sheath: 5 French 11 cm Pinnacle sheath retrograde right common femoral  Contrast: 40 cc   Fluoroscopy Time: 4.8 minutes  Indications:  The patient presents to [facility] with atherosclerotic occlusive disease bilateral lower extremities with ulceration of the left ankle.  Pedal pulses are nonpalpable bilaterally suggesting hemodynamically significant atherosclerotic occlusive disease.  The risks and benefits as well as alternative therapies for lower extremity revascularization are reviewed with the patient all questions are answered the patient agrees to proceed.  The patient is therefore undergoing angiography with the hope for intervention for limb salvage.   Procedure:  [name] a 74 y.o. male who was identified and appropriate procedural time out was performed.  The patient was then placed supine on the table and prepped and draped in the usual sterile fashion.  Ultrasound was used to evaluate the right common femoral artery.  It was echolucent and pulsatile indicating it is patent .  An ultrasound image was acquired for the  permanent record.  A micropuncture needle was used to access the right common femoral artery under direct ultrasound guidance.  The microwire was then advanced under fluoroscopic guidance without difficulty followed by the micro-sheath.  A 0.035 J wire was advanced without resistance and a 5Fr sheath was placed.    Pigtail catheter was then advanced to the level of T12 and AP projection of the aorta was obtained. Pigtail catheter was then repositioned to above the bifurcation and RAO view of the pelvis was obtained. Stiff angled Glidewire and pigtail catheter was then used across the bifurcation and the catheter was positioned in the distal external iliac artery.  LAO of the left groin was then obtained. Wire was reintroduced and negotiated into the SFA and the catheter was advanced into the SFA. Distal runoff was then performed.  After review of the images the catheter was removed over wire and an RAO view of the groin was obtained. StarClose device was deployed without difficulty.   Findings:   Aortogram: The abdominal aorta is opacified with a bolus injection contrast.  Single renal arteries are noted bilaterally with normal nephrograms.  No evidence of hemodynamically significant renal artery stenosis.  There are no hemodynamically significant stenoses identified within the aorta.  The aortic bifurcation is mildly diseased but widely patent.  Bilateral common internal and external iliac arteries are free of hemodynamically significant lesions.  Left lower Extremity: The left common femoral, profunda femoris superficial femoral and popliteal arteries demonstrate mild atherosclerotic changes but there are no hemodynamically significant lesions.  Trifurcation is patent.  There is 2 vessel runoff to the foot via the posterior tibial and the dorsalis pedis.  SUMMARY: Based on these images no intervention is performed at this time.    Disposition: Patient was  taken to the recovery room in stable  condition having tolerated the procedure well.  [name] [name] 11/16/2024,9:25 AM

## 2024-11-16 NOTE — Discharge Instructions (Signed)
Femoral Site Care °Refer to this sheet in the next few weeks. These instructions provide you with information about caring for yourself after your procedure. Your health care provider may also give you more specific instructions. Your treatment has been planned according to current medical practices, but problems sometimes occur. Call your health care provider if you have any problems or questions after your procedure. °What can I expect after the procedure? °After your procedure, it is typical to have the following: °· Bruising at the site that usually fades within 1-2 weeks. °· Blood collecting in the tissue (hematoma) that may be painful to the touch. It should usually decrease in size and tenderness within 1-2 weeks. ° °Follow these instructions at home: ° °· Take medicines only as directed by your health care provider.  If you take Metformin, hold for 48hours after your procedure. ° °The x-ray dye causes you to pass a considerate amount of urine.  For this reason, you will be asked to drink plenty of liquids after the procedure to prevent dehydration.  You may resume you regular diet.  Avoid caffeine products.   °· You may shower 24 hours after procedure. Leave the bandage on your access site for.  You may wash around your dressing but do not rub the site, this may cause bleeding.  Pat the area dry with a clean towel. After 48hours remove bandage and leave open to air. °· Do not take baths, swim, or use a hot tub for 7 days. °· Check your insertion site every day for redness, swelling, or drainage. °· If you lose feeling or develop tingling or pain in your leg or foot after the procedure, please walk around first.  If the discomfort does not improve , contact your physician and proceed to the nearest emergency room.  Loss of feeling in your leg might mean that a blockage has formed in the artery and this can be appropriately treated.  Limit your activity for the next two days after your procedure.  Avoid  stooping, bending, heavy lifting or exertion as this may put pressure on the insertion site.  Resume normal activities in 48 hours.   °check the insertion site occasionally.  If any oozing occurs or there is apparent swelling, firm pressure over the site will prevent a bruise from forming.  You can not hurt anything by pressing directly on the site.  The pressure stops the bleeding by allowing a small clot to form.  If the bleeding continues after the pressure has been applied for more than 15 minutes, call 911 or go to the nearest emergency room.   ° °· Apply pressure to access site if you have to laugh, cough, or sneeze. °· Do not apply powder or lotion to the site. °· Limit use of stairs to twice a day for the first 2-3 days or as directed by your health care provider. °· Do not squat for the first 2-3 days or as directed by your health care provider. °· Do not lift over 10 lb (4.5 kg) for 5 days after your procedure or as directed by your health care provider. °· Ask your health care provider when it is okay to: °? Return to work or school. °? Resume usual physical activities or sports. °? Resume sexual activity. °· Do not drive home if you are discharged the same day as the procedure. Have someone else drive you. °· You may drive 48 hours after the procedure unless otherwise instructed by your health care   provider. °· Do not operate machinery or power tools for 24 hours after the procedure or as directed by your health care provider. °· If your procedure was done as an outpatient procedure, which means that you went home the same day as your procedure, a responsible adult should be with you for the first 24 hours after you arrive home. °· Keep all follow-up visits as directed by your health care provider. This is important. °Contact a health care provider if: °· You have a fever. °· You have chills. °· You have increased bleeding from the site. Hold pressure on the site. °Get help right away if: °· You have  unusual pain at the site. °· You have redness, warmth, or swelling at the site. °· You have drainage (other than a small amount of blood on the dressing) from the site. °· The site is bleeding, and the bleeding does not stop after 30 minutes of holding steady pressure on the site. °· Your leg or foot becomes pale, cool, tingly, or numb. °This information is not intended to replace advice given to you by your health care provider. Make sure you discuss any questions you have with your health care provider. °Document Released: 06/24/2014 Document Revised: 03/28/2016 Document Reviewed: 05/10/2014 °[employer] Patient Education © 2018 [employer] °

## 2024-12-16 ENCOUNTER — Ambulatory Visit (INDEPENDENT_AMBULATORY_CARE_PROVIDER_SITE_OTHER): Admitting: Vascular Surgery

## 2025-01-19 ENCOUNTER — Ambulatory Visit: Admitting: "Endocrinology

## 2025-03-03 ENCOUNTER — Ambulatory Visit (HOSPITAL_BASED_OUTPATIENT_CLINIC_OR_DEPARTMENT_OTHER): Admitting: Family Medicine

## 2025-05-11 ENCOUNTER — Ambulatory Visit: Admitting: Internal Medicine
